# Patient Record
Sex: Female | Born: 1955
Health system: Southern US, Community
[De-identification: ages and names within clinical notes are randomized; demographics above are authoritative.]

## PROBLEM LIST (undated history)

## (undated) ENCOUNTER — Emergency Department (HOSPITAL_COMMUNITY): Admission: EM | Payer: Medicare Other

## (undated) DIAGNOSIS — I2510 Atherosclerotic heart disease of native coronary artery without angina pectoris: Secondary | ICD-10-CM

## (undated) DIAGNOSIS — J189 Pneumonia, unspecified organism: Secondary | ICD-10-CM

## (undated) DIAGNOSIS — T8149XA Infection following a procedure, other surgical site, initial encounter: Secondary | ICD-10-CM

## (undated) DIAGNOSIS — I5031 Acute diastolic (congestive) heart failure: Secondary | ICD-10-CM

## (undated) DIAGNOSIS — M109 Gout, unspecified: Secondary | ICD-10-CM

## (undated) DIAGNOSIS — N189 Chronic kidney disease, unspecified: Secondary | ICD-10-CM

## (undated) DIAGNOSIS — K219 Gastro-esophageal reflux disease without esophagitis: Secondary | ICD-10-CM

## (undated) DIAGNOSIS — B1920 Unspecified viral hepatitis C without hepatic coma: Secondary | ICD-10-CM

## (undated) DIAGNOSIS — E669 Obesity, unspecified: Secondary | ICD-10-CM

## (undated) DIAGNOSIS — T8859XA Other complications of anesthesia, initial encounter: Secondary | ICD-10-CM

## (undated) DIAGNOSIS — R011 Cardiac murmur, unspecified: Secondary | ICD-10-CM

## (undated) DIAGNOSIS — I10 Essential (primary) hypertension: Secondary | ICD-10-CM

## (undated) DIAGNOSIS — IMO0002 Reserved for concepts with insufficient information to code with codable children: Secondary | ICD-10-CM

## (undated) DIAGNOSIS — N186 End stage renal disease: Secondary | ICD-10-CM

## (undated) DIAGNOSIS — F329 Major depressive disorder, single episode, unspecified: Secondary | ICD-10-CM

## (undated) DIAGNOSIS — M779 Enthesopathy, unspecified: Secondary | ICD-10-CM

## (undated) DIAGNOSIS — R251 Tremor, unspecified: Secondary | ICD-10-CM

## (undated) DIAGNOSIS — M1990 Unspecified osteoarthritis, unspecified site: Secondary | ICD-10-CM

## (undated) DIAGNOSIS — I350 Nonrheumatic aortic (valve) stenosis: Secondary | ICD-10-CM

## (undated) DIAGNOSIS — IMO0001 Reserved for inherently not codable concepts without codable children: Secondary | ICD-10-CM

## (undated) DIAGNOSIS — Z531 Procedure and treatment not carried out because of patient's decision for reasons of belief and group pressure: Secondary | ICD-10-CM

## (undated) DIAGNOSIS — I5042 Chronic combined systolic (congestive) and diastolic (congestive) heart failure: Secondary | ICD-10-CM

## (undated) DIAGNOSIS — T4145XA Adverse effect of unspecified anesthetic, initial encounter: Secondary | ICD-10-CM

## (undated) DIAGNOSIS — K859 Acute pancreatitis without necrosis or infection, unspecified: Secondary | ICD-10-CM

## (undated) DIAGNOSIS — E785 Hyperlipidemia, unspecified: Secondary | ICD-10-CM

## (undated) DIAGNOSIS — J45909 Unspecified asthma, uncomplicated: Secondary | ICD-10-CM

## (undated) DIAGNOSIS — F32A Depression, unspecified: Secondary | ICD-10-CM

## (undated) DIAGNOSIS — D649 Anemia, unspecified: Secondary | ICD-10-CM

## (undated) DIAGNOSIS — J40 Bronchitis, not specified as acute or chronic: Secondary | ICD-10-CM

## (undated) DIAGNOSIS — I219 Acute myocardial infarction, unspecified: Secondary | ICD-10-CM

## (undated) DIAGNOSIS — Z992 Dependence on renal dialysis: Secondary | ICD-10-CM

## (undated) DIAGNOSIS — G4733 Obstructive sleep apnea (adult) (pediatric): Secondary | ICD-10-CM

## (undated) HISTORY — DX: Obstructive sleep apnea (adult) (pediatric): G47.33

## (undated) HISTORY — DX: Chronic kidney disease, unspecified: N18.9

## (undated) HISTORY — DX: Chronic combined systolic (congestive) and diastolic (congestive) heart failure: I50.42

## (undated) HISTORY — DX: Nonrheumatic aortic (valve) stenosis: I35.0

## (undated) HISTORY — PX: CHOLECYSTECTOMY: SHX55

## (undated) HISTORY — PX: TUBAL LIGATION: SHX77

## (undated) HISTORY — PX: KNEE ARTHROSCOPY: SUR90

---

## 1998-01-11 ENCOUNTER — Other Ambulatory Visit: Admission: RE | Admit: 1998-01-11 | Discharge: 1998-01-11 | Payer: Self-pay | Admitting: Hematology and Oncology

## 1998-11-28 ENCOUNTER — Ambulatory Visit (HOSPITAL_COMMUNITY): Admission: RE | Admit: 1998-11-28 | Discharge: 1998-11-28 | Payer: Self-pay | Admitting: Internal Medicine

## 1999-06-22 ENCOUNTER — Emergency Department (HOSPITAL_COMMUNITY): Admission: EM | Admit: 1999-06-22 | Discharge: 1999-06-22 | Payer: Self-pay | Admitting: Emergency Medicine

## 1999-08-15 ENCOUNTER — Ambulatory Visit (HOSPITAL_COMMUNITY): Admission: RE | Admit: 1999-08-15 | Discharge: 1999-08-15 | Payer: Self-pay | Admitting: *Deleted

## 2000-04-15 ENCOUNTER — Encounter: Admission: RE | Admit: 2000-04-15 | Discharge: 2000-04-15 | Payer: Self-pay | Admitting: Obstetrics & Gynecology

## 2000-04-15 ENCOUNTER — Other Ambulatory Visit: Admission: RE | Admit: 2000-04-15 | Discharge: 2000-04-15 | Payer: Self-pay | Admitting: Obstetrics

## 2000-04-26 ENCOUNTER — Ambulatory Visit (HOSPITAL_COMMUNITY): Admission: RE | Admit: 2000-04-26 | Discharge: 2000-04-26 | Payer: Self-pay

## 2000-05-22 ENCOUNTER — Emergency Department (HOSPITAL_COMMUNITY): Admission: EM | Admit: 2000-05-22 | Discharge: 2000-05-22 | Payer: Self-pay | Admitting: Emergency Medicine

## 2000-05-22 ENCOUNTER — Encounter: Payer: Self-pay | Admitting: Emergency Medicine

## 2000-06-09 ENCOUNTER — Encounter: Admission: RE | Admit: 2000-06-09 | Discharge: 2000-06-09 | Payer: Self-pay | Admitting: Family Medicine

## 2000-10-19 ENCOUNTER — Encounter: Admission: RE | Admit: 2000-10-19 | Discharge: 2000-10-19 | Payer: Self-pay | Admitting: Family Medicine

## 2000-10-21 ENCOUNTER — Encounter: Admission: RE | Admit: 2000-10-21 | Discharge: 2000-10-21 | Payer: Self-pay | Admitting: Family Medicine

## 2001-03-18 ENCOUNTER — Encounter: Admission: RE | Admit: 2001-03-18 | Discharge: 2001-03-18 | Payer: Self-pay | Admitting: Family Medicine

## 2001-04-21 ENCOUNTER — Encounter: Admission: RE | Admit: 2001-04-21 | Discharge: 2001-04-21 | Payer: Self-pay | Admitting: Family Medicine

## 2001-05-18 ENCOUNTER — Encounter: Payer: Self-pay | Admitting: Emergency Medicine

## 2001-05-18 ENCOUNTER — Observation Stay (HOSPITAL_COMMUNITY): Admission: EM | Admit: 2001-05-18 | Discharge: 2001-05-19 | Payer: Self-pay | Admitting: Emergency Medicine

## 2001-07-08 ENCOUNTER — Encounter: Admission: RE | Admit: 2001-07-08 | Discharge: 2001-07-08 | Payer: Self-pay | Admitting: Family Medicine

## 2001-10-03 ENCOUNTER — Encounter: Admission: RE | Admit: 2001-10-03 | Discharge: 2001-10-03 | Payer: Self-pay | Admitting: Sports Medicine

## 2001-10-19 ENCOUNTER — Encounter: Admission: RE | Admit: 2001-10-19 | Discharge: 2001-10-19 | Payer: Self-pay | Admitting: Family Medicine

## 2002-01-31 ENCOUNTER — Encounter: Admission: RE | Admit: 2002-01-31 | Discharge: 2002-01-31 | Payer: Self-pay | Admitting: Family Medicine

## 2002-01-31 ENCOUNTER — Encounter: Admission: RE | Admit: 2002-01-31 | Discharge: 2002-01-31 | Payer: Self-pay | Admitting: *Deleted

## 2002-02-03 ENCOUNTER — Encounter: Admission: RE | Admit: 2002-02-03 | Discharge: 2002-02-03 | Payer: Self-pay | Admitting: Family Medicine

## 2002-04-07 ENCOUNTER — Encounter: Payer: Self-pay | Admitting: Internal Medicine

## 2002-04-07 ENCOUNTER — Inpatient Hospital Stay (HOSPITAL_COMMUNITY): Admission: AD | Admit: 2002-04-07 | Discharge: 2002-04-12 | Payer: Self-pay | Admitting: Internal Medicine

## 2002-04-08 ENCOUNTER — Encounter: Payer: Self-pay | Admitting: Internal Medicine

## 2002-04-10 ENCOUNTER — Encounter: Payer: Self-pay | Admitting: Internal Medicine

## 2002-05-04 ENCOUNTER — Encounter: Payer: Self-pay | Admitting: Internal Medicine

## 2002-05-04 ENCOUNTER — Encounter: Admission: RE | Admit: 2002-05-04 | Discharge: 2002-05-04 | Payer: Self-pay | Admitting: Internal Medicine

## 2002-07-21 ENCOUNTER — Encounter: Admission: RE | Admit: 2002-07-21 | Discharge: 2002-07-21 | Payer: Self-pay | Admitting: Internal Medicine

## 2002-07-21 ENCOUNTER — Encounter: Payer: Self-pay | Admitting: Internal Medicine

## 2002-08-13 ENCOUNTER — Emergency Department (HOSPITAL_COMMUNITY): Admission: EM | Admit: 2002-08-13 | Discharge: 2002-08-13 | Payer: Self-pay | Admitting: Emergency Medicine

## 2002-11-02 ENCOUNTER — Emergency Department (HOSPITAL_COMMUNITY): Admission: EM | Admit: 2002-11-02 | Discharge: 2002-11-02 | Payer: Self-pay | Admitting: Emergency Medicine

## 2003-04-04 ENCOUNTER — Emergency Department (HOSPITAL_COMMUNITY): Admission: EM | Admit: 2003-04-04 | Discharge: 2003-04-04 | Payer: Self-pay

## 2003-06-11 ENCOUNTER — Emergency Department (HOSPITAL_COMMUNITY): Admission: EM | Admit: 2003-06-11 | Discharge: 2003-06-11 | Payer: Self-pay | Admitting: Emergency Medicine

## 2003-06-28 ENCOUNTER — Emergency Department (HOSPITAL_COMMUNITY): Admission: AD | Admit: 2003-06-28 | Discharge: 2003-06-28 | Payer: Self-pay | Admitting: Family Medicine

## 2003-07-02 ENCOUNTER — Emergency Department (HOSPITAL_COMMUNITY): Admission: EM | Admit: 2003-07-02 | Discharge: 2003-07-02 | Payer: Self-pay | Admitting: Emergency Medicine

## 2003-07-20 ENCOUNTER — Encounter: Admission: RE | Admit: 2003-07-20 | Discharge: 2003-07-20 | Payer: Self-pay | Admitting: Internal Medicine

## 2003-07-25 ENCOUNTER — Emergency Department (HOSPITAL_COMMUNITY): Admission: EM | Admit: 2003-07-25 | Discharge: 2003-07-26 | Payer: Self-pay | Admitting: Emergency Medicine

## 2003-11-16 ENCOUNTER — Other Ambulatory Visit: Admission: RE | Admit: 2003-11-16 | Discharge: 2003-11-16 | Payer: Self-pay | Admitting: Obstetrics and Gynecology

## 2003-11-19 ENCOUNTER — Ambulatory Visit (HOSPITAL_COMMUNITY): Admission: RE | Admit: 2003-11-19 | Discharge: 2003-11-19 | Payer: Self-pay | Admitting: Obstetrics and Gynecology

## 2003-11-20 ENCOUNTER — Emergency Department (HOSPITAL_COMMUNITY): Admission: EM | Admit: 2003-11-20 | Discharge: 2003-11-20 | Payer: Self-pay

## 2003-11-23 ENCOUNTER — Emergency Department (HOSPITAL_COMMUNITY): Admission: EM | Admit: 2003-11-23 | Discharge: 2003-11-24 | Payer: Self-pay | Admitting: Emergency Medicine

## 2004-02-17 ENCOUNTER — Emergency Department (HOSPITAL_COMMUNITY): Admission: EM | Admit: 2004-02-17 | Discharge: 2004-02-17 | Payer: Self-pay | Admitting: Emergency Medicine

## 2004-04-03 ENCOUNTER — Emergency Department (HOSPITAL_COMMUNITY): Admission: EM | Admit: 2004-04-03 | Discharge: 2004-04-03 | Payer: Self-pay | Admitting: Emergency Medicine

## 2004-04-29 ENCOUNTER — Ambulatory Visit (HOSPITAL_COMMUNITY): Admission: RE | Admit: 2004-04-29 | Discharge: 2004-04-29 | Payer: Self-pay | Admitting: Obstetrics and Gynecology

## 2004-04-29 ENCOUNTER — Encounter (INDEPENDENT_AMBULATORY_CARE_PROVIDER_SITE_OTHER): Payer: Self-pay | Admitting: Specialist

## 2004-06-08 ENCOUNTER — Emergency Department (HOSPITAL_COMMUNITY): Admission: EM | Admit: 2004-06-08 | Discharge: 2004-06-08 | Payer: Self-pay | Admitting: Emergency Medicine

## 2004-06-29 ENCOUNTER — Emergency Department (HOSPITAL_COMMUNITY): Admission: EM | Admit: 2004-06-29 | Discharge: 2004-06-29 | Payer: Self-pay | Admitting: Emergency Medicine

## 2004-08-04 ENCOUNTER — Ambulatory Visit (HOSPITAL_COMMUNITY): Admission: RE | Admit: 2004-08-04 | Discharge: 2004-08-04 | Payer: Self-pay | Admitting: Internal Medicine

## 2004-09-01 ENCOUNTER — Emergency Department (HOSPITAL_COMMUNITY): Admission: EM | Admit: 2004-09-01 | Discharge: 2004-09-01 | Payer: Self-pay | Admitting: *Deleted

## 2004-09-08 ENCOUNTER — Emergency Department (HOSPITAL_COMMUNITY): Admission: EM | Admit: 2004-09-08 | Discharge: 2004-09-08 | Payer: Self-pay | Admitting: Emergency Medicine

## 2004-11-18 ENCOUNTER — Other Ambulatory Visit: Admission: RE | Admit: 2004-11-18 | Discharge: 2004-11-18 | Payer: Self-pay | Admitting: Obstetrics and Gynecology

## 2004-12-17 ENCOUNTER — Encounter (INDEPENDENT_AMBULATORY_CARE_PROVIDER_SITE_OTHER): Payer: Self-pay | Admitting: *Deleted

## 2004-12-17 ENCOUNTER — Ambulatory Visit (HOSPITAL_COMMUNITY): Admission: RE | Admit: 2004-12-17 | Discharge: 2004-12-17 | Payer: Self-pay | Admitting: Obstetrics and Gynecology

## 2005-01-06 ENCOUNTER — Encounter (INDEPENDENT_AMBULATORY_CARE_PROVIDER_SITE_OTHER): Payer: Self-pay | Admitting: Cardiology

## 2005-01-06 ENCOUNTER — Ambulatory Visit (HOSPITAL_COMMUNITY): Admission: RE | Admit: 2005-01-06 | Discharge: 2005-01-06 | Payer: Self-pay | Admitting: Internal Medicine

## 2005-01-31 ENCOUNTER — Emergency Department (HOSPITAL_COMMUNITY): Admission: EM | Admit: 2005-01-31 | Discharge: 2005-02-01 | Payer: Self-pay | Admitting: Emergency Medicine

## 2005-02-04 ENCOUNTER — Emergency Department (HOSPITAL_COMMUNITY): Admission: EM | Admit: 2005-02-04 | Discharge: 2005-02-05 | Payer: Self-pay | Admitting: Emergency Medicine

## 2005-04-27 ENCOUNTER — Emergency Department (HOSPITAL_COMMUNITY): Admission: EM | Admit: 2005-04-27 | Discharge: 2005-04-27 | Payer: Self-pay | Admitting: Emergency Medicine

## 2005-06-03 ENCOUNTER — Emergency Department (HOSPITAL_COMMUNITY): Admission: EM | Admit: 2005-06-03 | Discharge: 2005-06-03 | Payer: Self-pay | Admitting: Emergency Medicine

## 2005-10-14 ENCOUNTER — Emergency Department (HOSPITAL_COMMUNITY): Admission: EM | Admit: 2005-10-14 | Discharge: 2005-10-14 | Payer: Self-pay | Admitting: Emergency Medicine
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
## 2005-11-02 ENCOUNTER — Emergency Department (HOSPITAL_COMMUNITY): Admission: EM | Admit: 2005-11-02 | Discharge: 2005-11-02 | Payer: Self-pay | Admitting: Emergency Medicine

## 2005-11-03 ENCOUNTER — Emergency Department (HOSPITAL_COMMUNITY): Admission: EM | Admit: 2005-11-03 | Discharge: 2005-11-03 | Payer: Self-pay | Admitting: Emergency Medicine

## 2006-03-08 IMAGING — US US PELVIS COMPLETE MODIFY
1 series · 18 of 25 positions shown · non-contrast
Comparison: none

CLINICAL DATA: Menorrhagia.  LMP of 11/08/03.
PELVIC ULTRASOUND WITH TRANSVAGINAL, 11/19/03  
Multiple images of the uterus and adnexa were obtained using transabdominal and endovaginal approaches.  
The uterus has a maximal sagittal length of 11.7 cm and a maximal AP width of 5.4 cm.  There is a small amount of complex fluid identified in the endometrial canal, which likely represents blood in this patient with a history of vaginal bleeding.  Emanating into the endometrial canal from the anterior myometrium is what appears to be a predominantly submucosal fibroid.  This measures of 2.4 x 1.7 x 2.0 cm and greater than 50 percent of the surface of this fibroid appears to be within the endometrial lumen.  The endometrial canal appears thin and echogenic and is seen overlying this fibroid with no areas of focal thickening or inhomogeneity noted.  The myometrium is otherwise homogeneous.
The left ovary has a normal appearance, measuring 2.7 x 1.6 x 1.8 cm.  The right ovary measures 3.9 x 2.3 x 3.2 cm and contains a unilocular simple cyst measuring 2.7 x 2.0 x 2.5 cm.  Given the phase of the patient?s cycle, this most likely represents a dominant follicle.  No free fluid or separate adnexal masses are noted. 
IMPRESSION 
Findings compatible with a submucosal fibroid with size and location as noted above.  The overlying endometrium appears within normal limits.  Probable dominant right follicle.

[Series 1: us transvaginal non-ob · 18 of 42 slices shown]
[im 1/42]
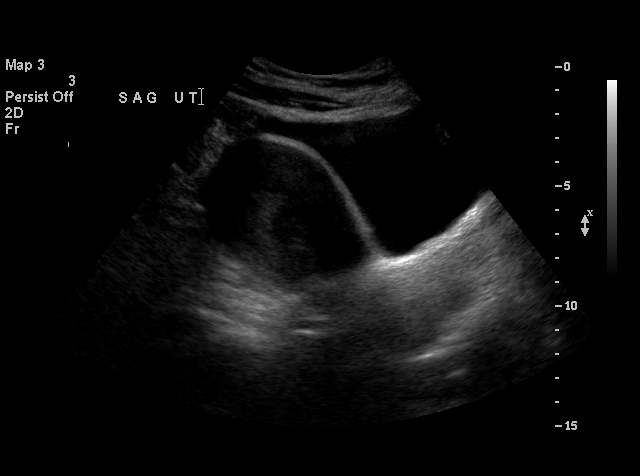
[im 4/42]
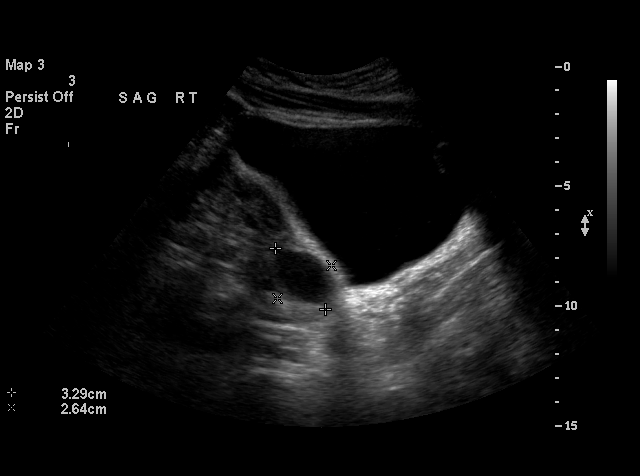
[im 6/42]
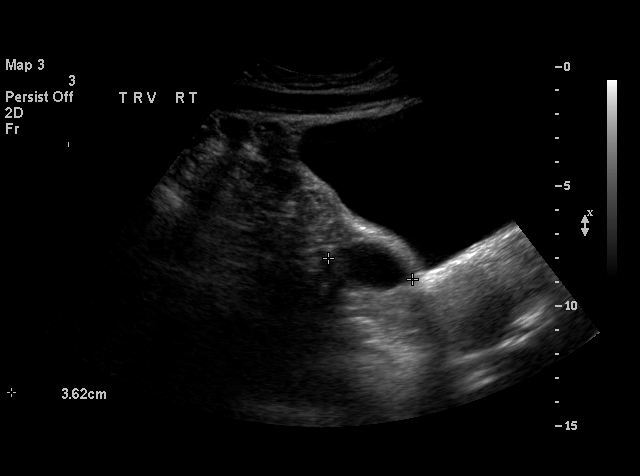
[im 7/42]
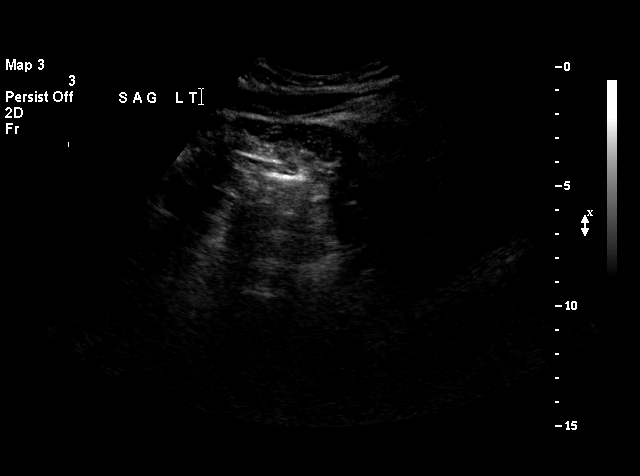
[im 11/42]
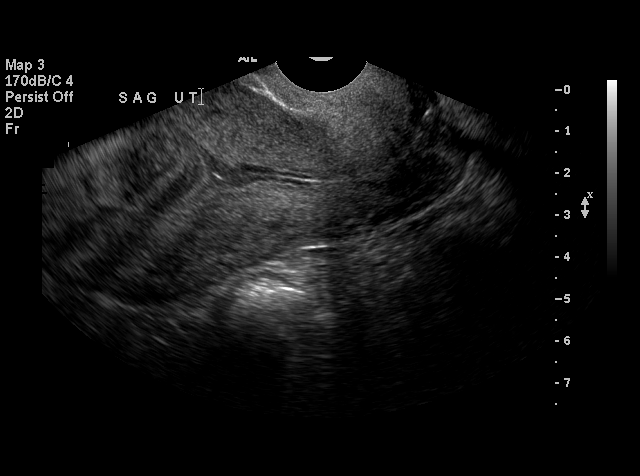
[im 12/42]
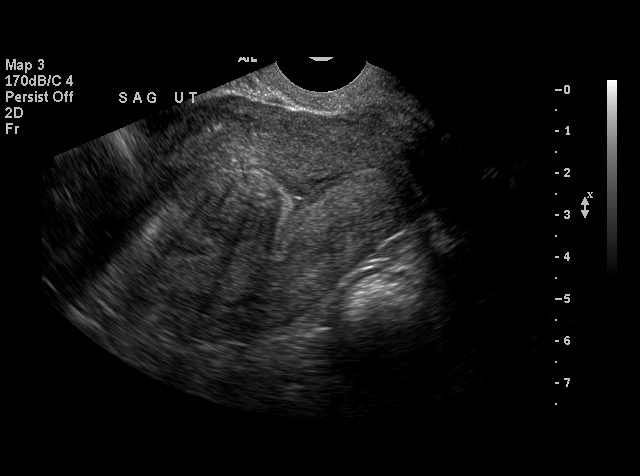
[im 16/42]
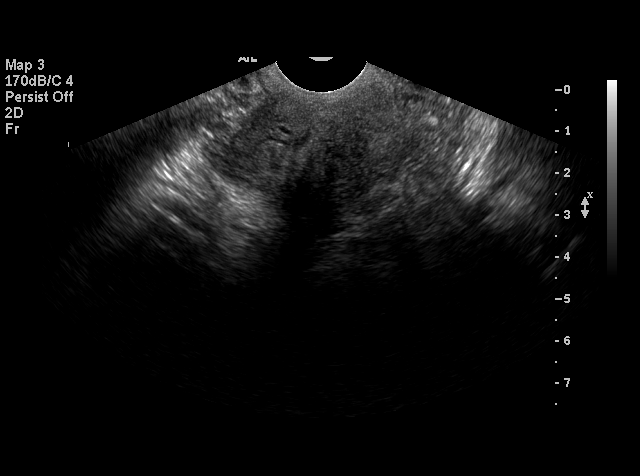
[im 18/42]
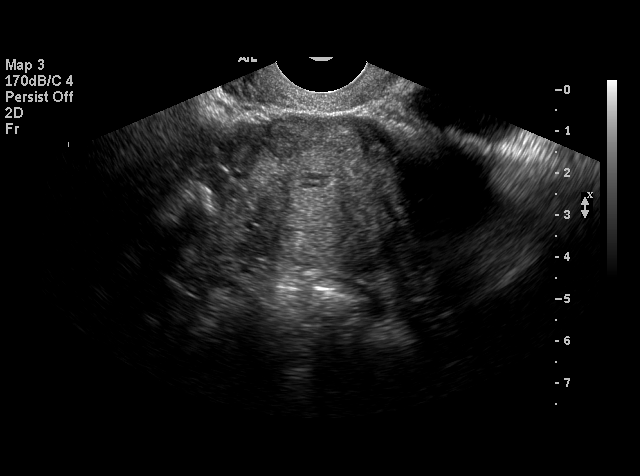
[im 19/42]
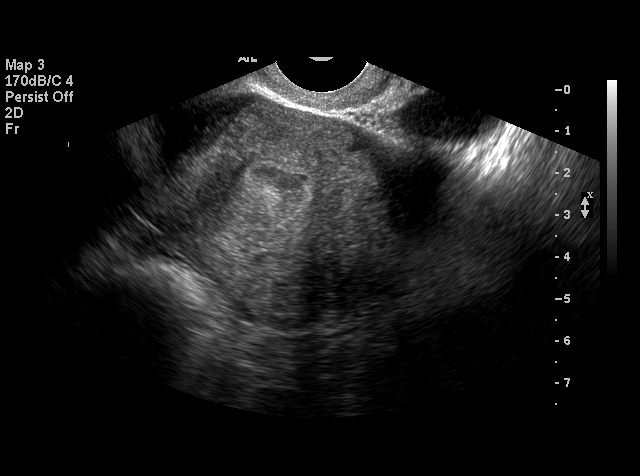
[im 23/42]
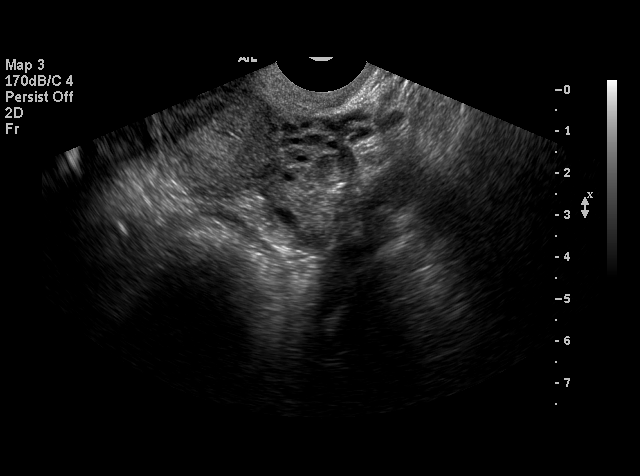
[im 24/42]
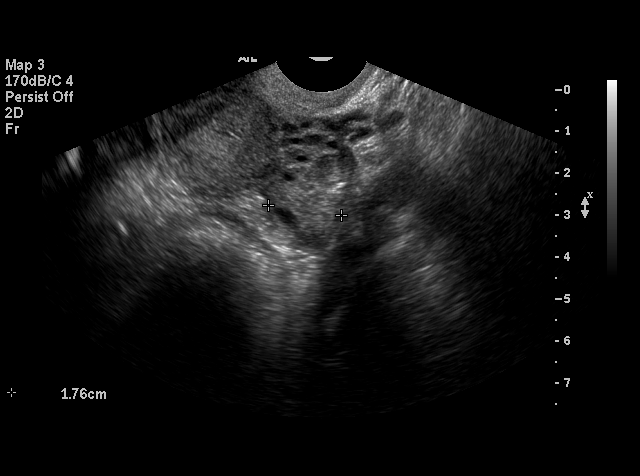
[im 26/42]
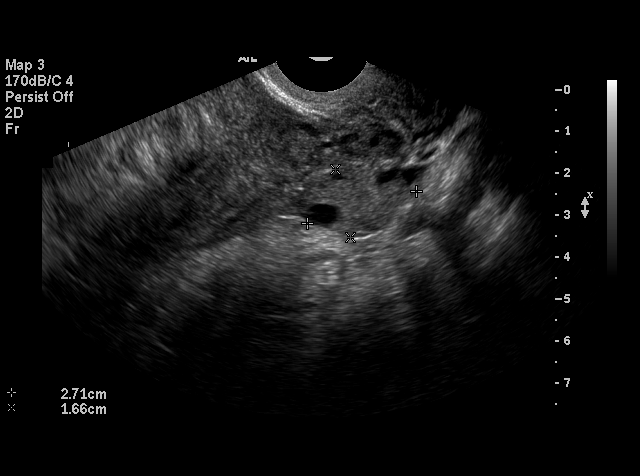
[im 30/42]
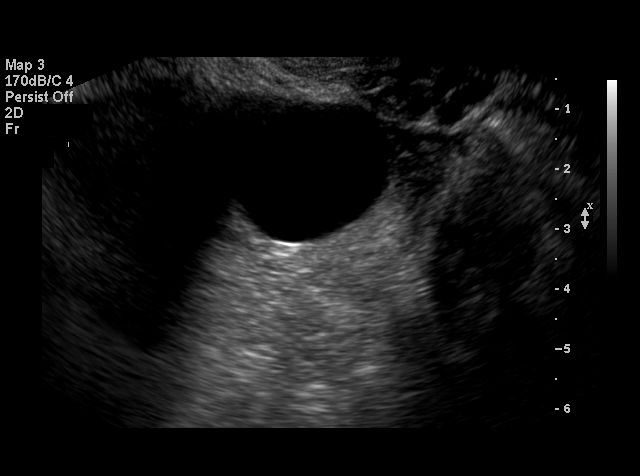
[im 31/42]
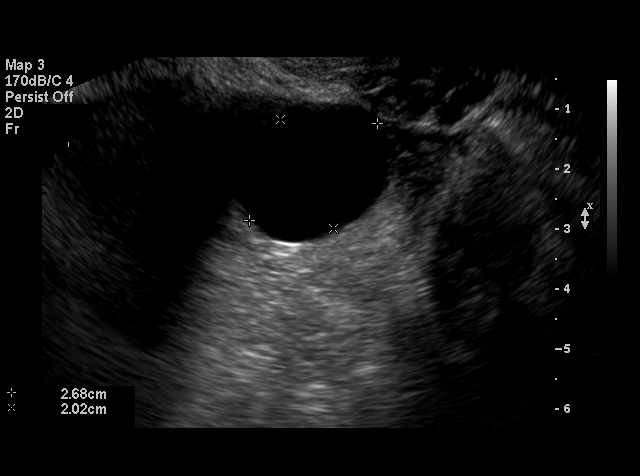
[im 35/42]
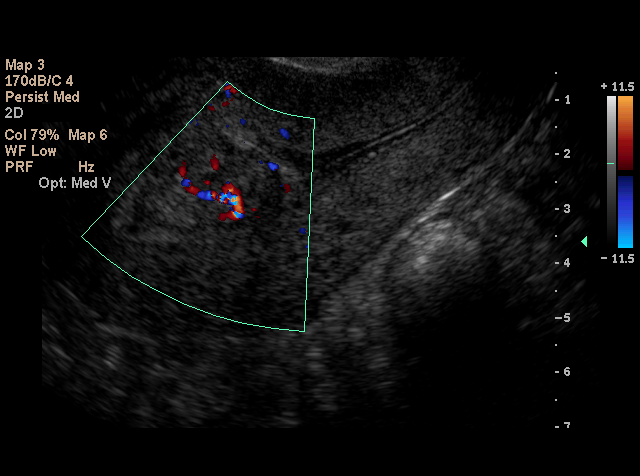
[im 36/42]
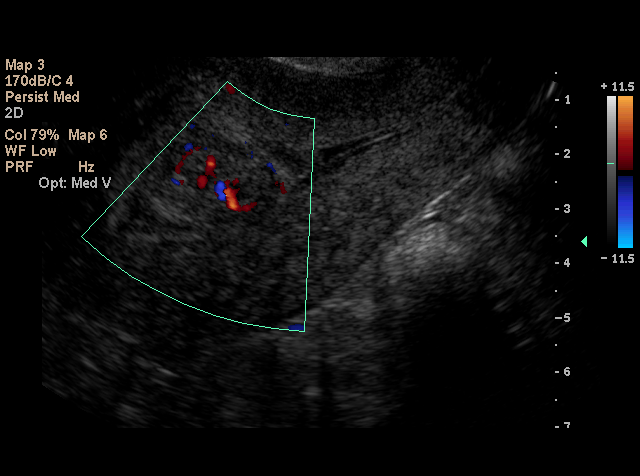
[im 38/42]
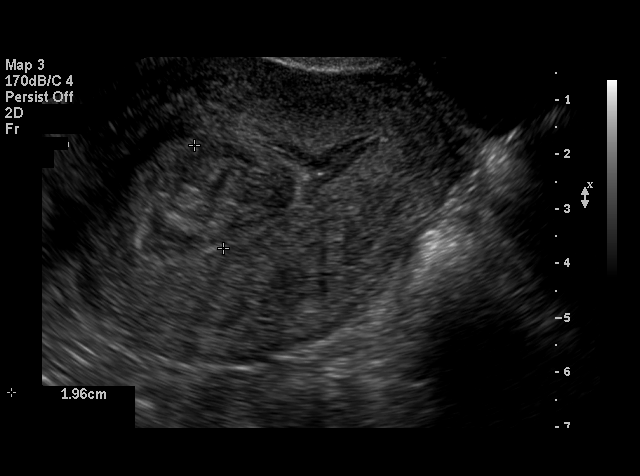
[im 42/42]
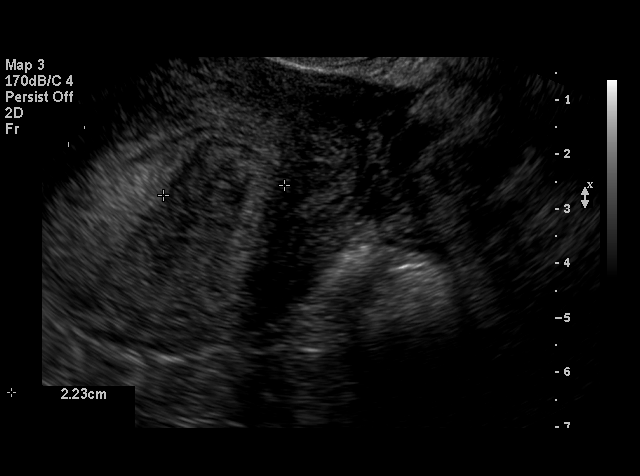

[18 of 25 positions shown; findings below may reference images not displayed]

## 2006-03-21 ENCOUNTER — Inpatient Hospital Stay (HOSPITAL_COMMUNITY): Admission: EM | Admit: 2006-03-21 | Discharge: 2006-03-26 | Payer: Self-pay | Admitting: Emergency Medicine

## 2006-03-23 ENCOUNTER — Encounter (INDEPENDENT_AMBULATORY_CARE_PROVIDER_SITE_OTHER): Payer: Self-pay | Admitting: *Deleted

## 2006-03-31 ENCOUNTER — Other Ambulatory Visit: Admission: RE | Admit: 2006-03-31 | Discharge: 2006-03-31 | Payer: Self-pay | Admitting: Obstetrics and Gynecology

## 2006-06-06 IMAGING — CR DG KNEE COMPLETE 4+V*L*
4 series · 4 of 4 positions shown · non-contrast
Comparison: none

CLINICAL DATA: 47-year-old with pain in lateral aspect of the knee.  
 LEFT KNEE COMPLETE (FOUR VIEWS)
 Four views are performed of the knee showing degenerative change especially involving the medial and patellofemoral compartments.  Note is made of joint effusion.  No evidence for acute fracture or dislocation. 
 IMPRESSION
 Degenerative change and joint effusion.

[view not recorded (1 of 4)]
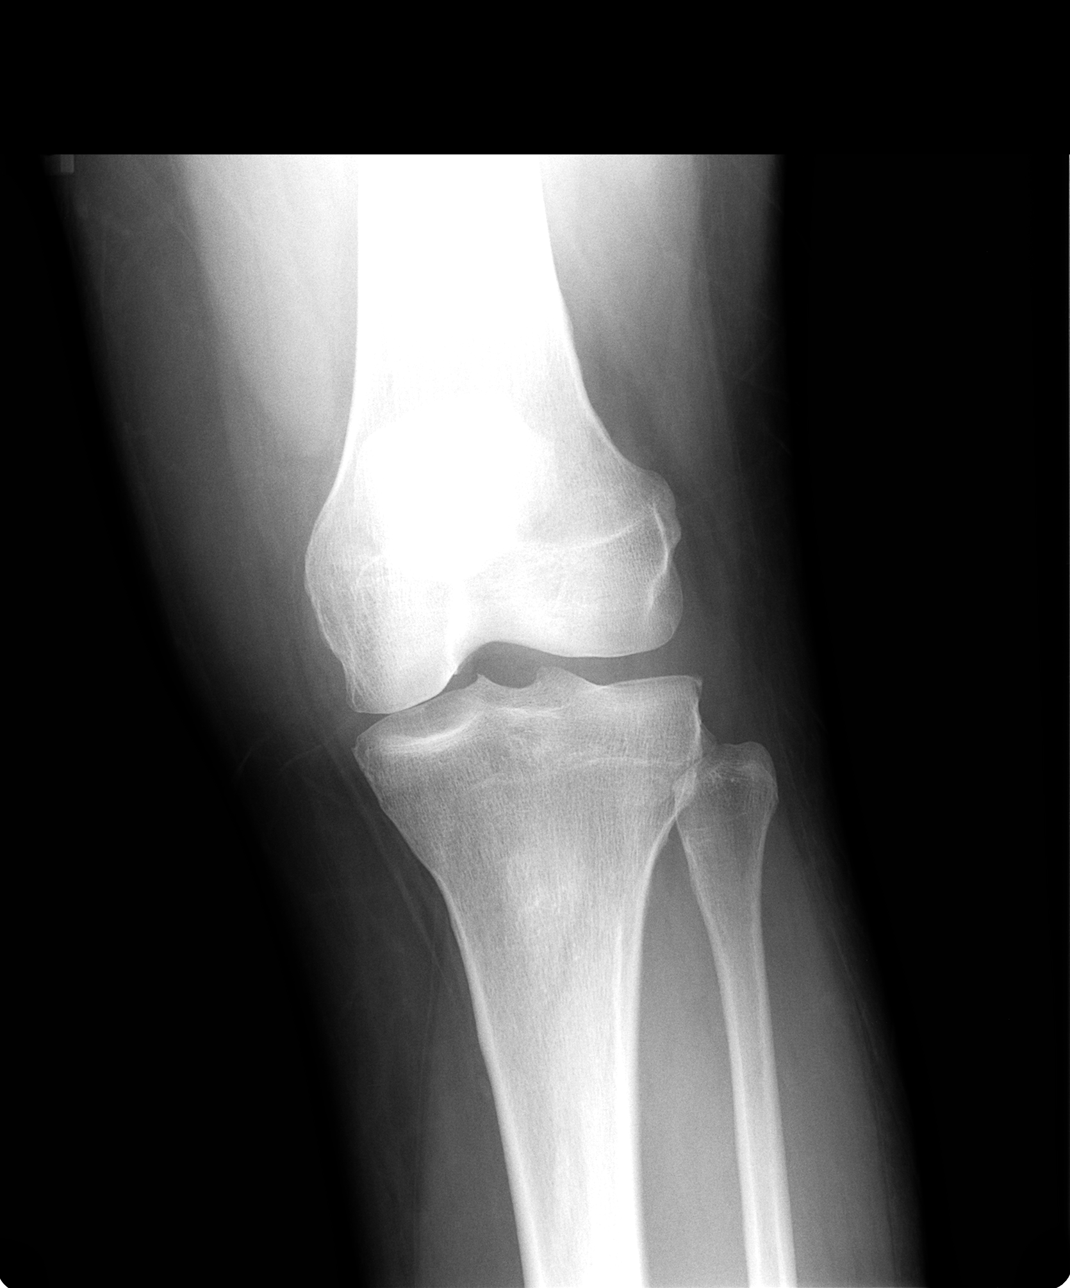

[view not recorded (2 of 4)]
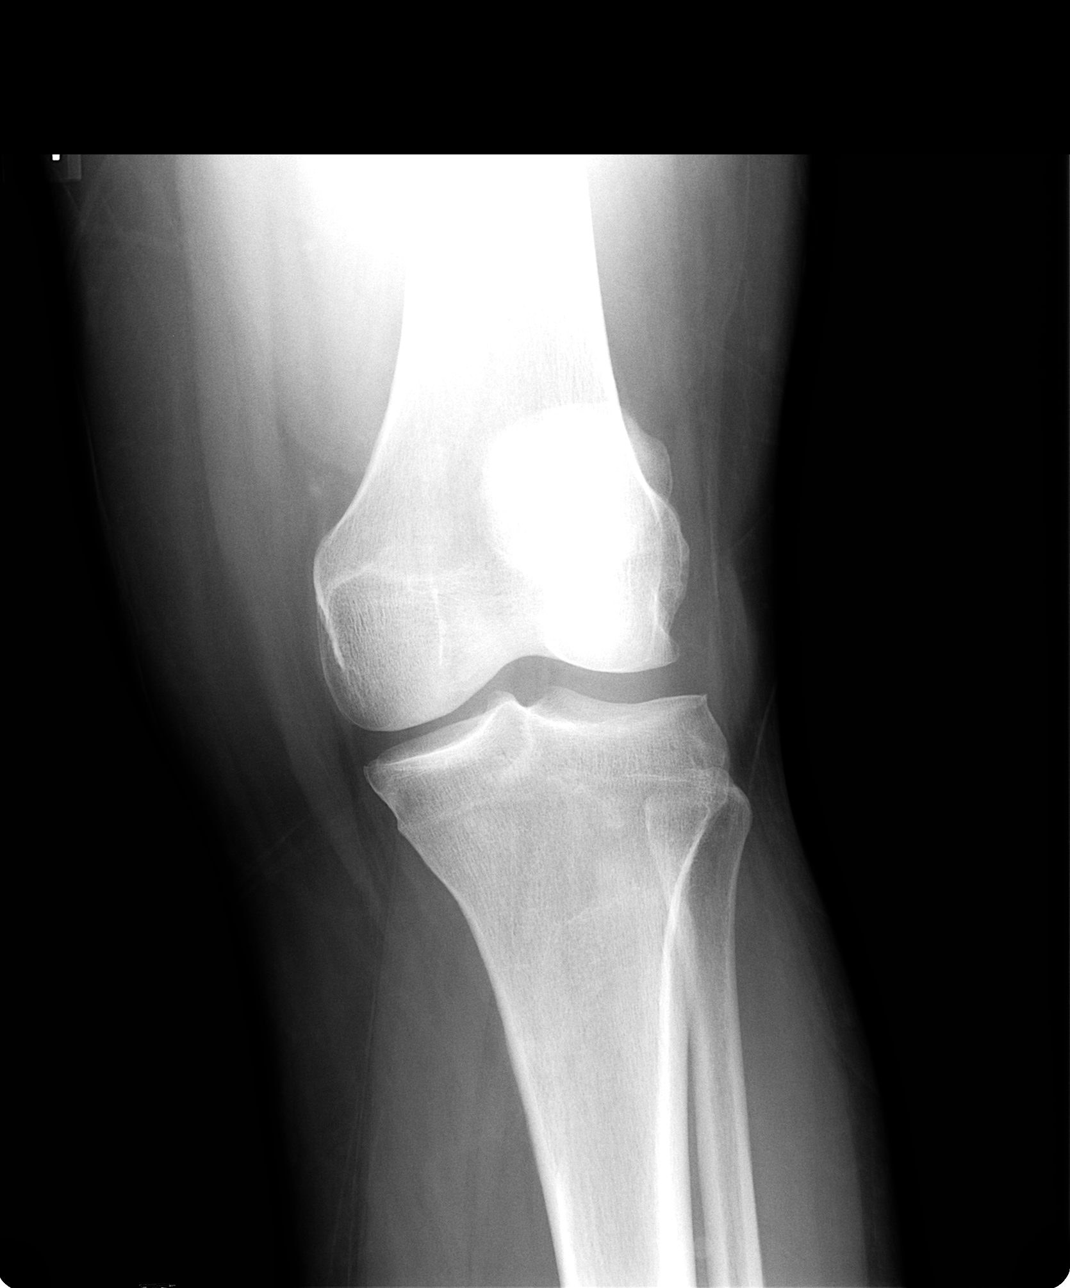

[view not recorded (3 of 4)]
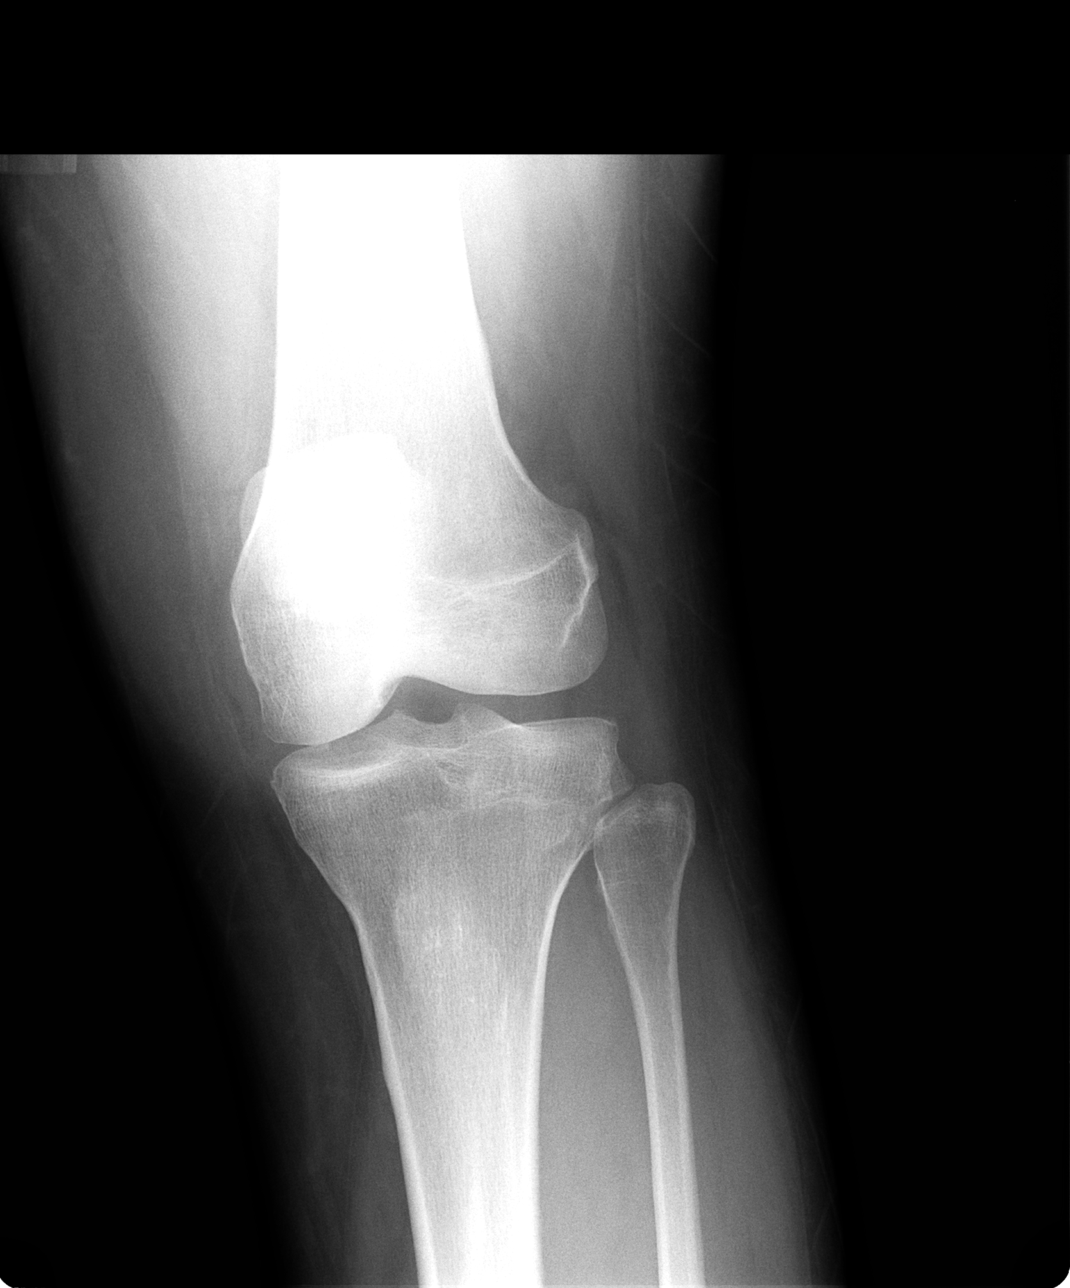

[view not recorded (4 of 4)]
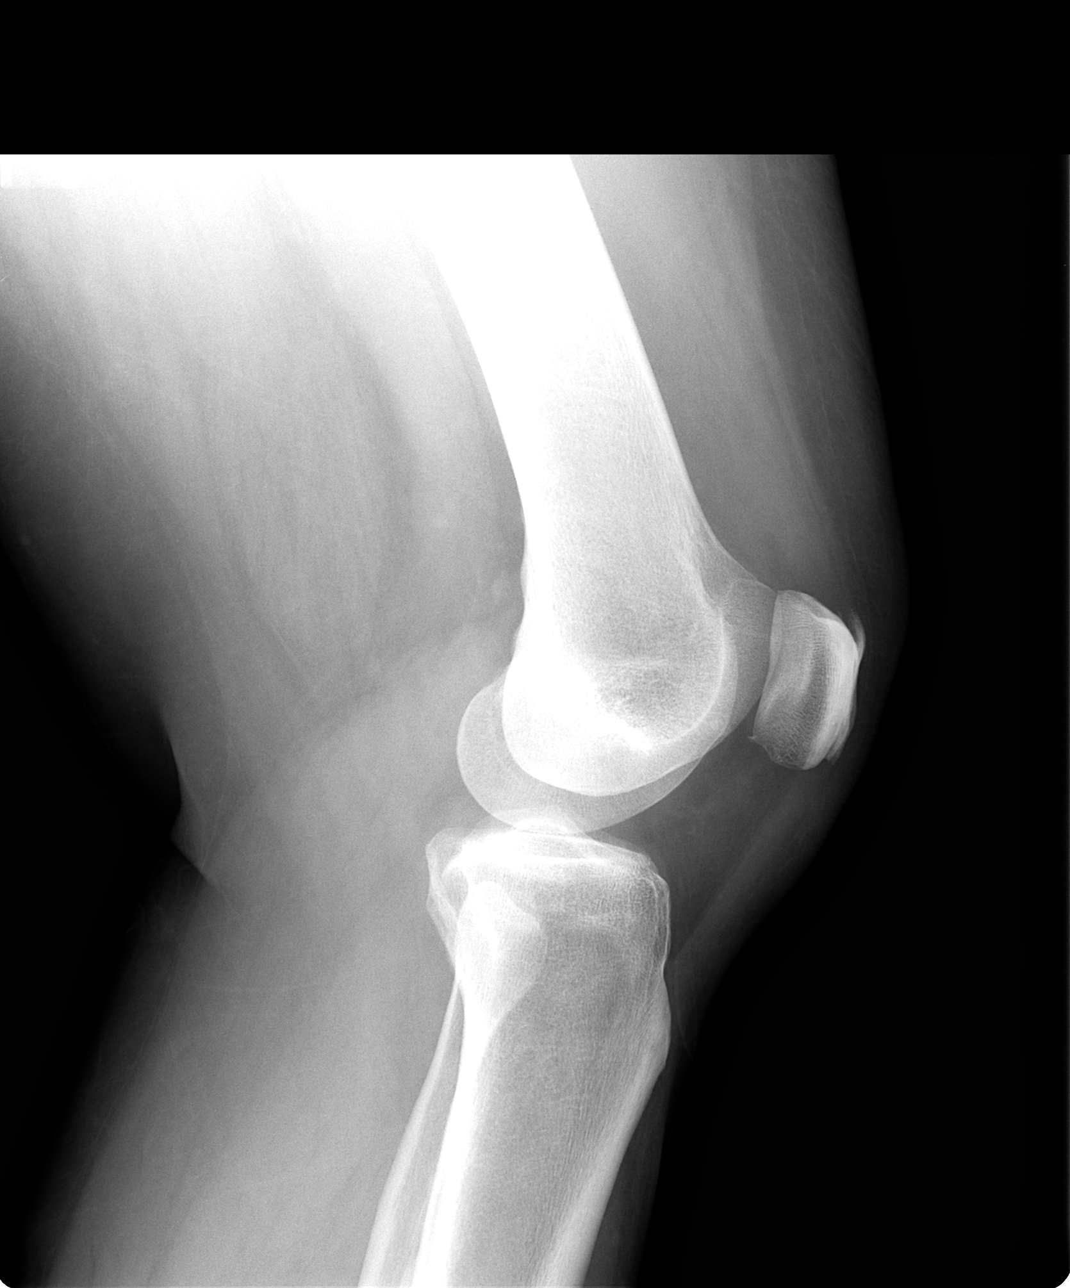

[4 of 4 positions shown; findings below may reference images not displayed]

## 2006-07-14 ENCOUNTER — Emergency Department (HOSPITAL_COMMUNITY): Admission: EM | Admit: 2006-07-14 | Discharge: 2006-07-14 | Payer: Self-pay | Admitting: Emergency Medicine

## 2006-07-22 IMAGING — CR DG ANKLE COMPLETE 3+V*R*
3 series · 3 of 3 positions shown · non-contrast
Comparison: none

CLINICAL DATA: Pain without trauma.

RIGHT FOOT, THREE VIEWS

[view not recorded (1 of 3)]
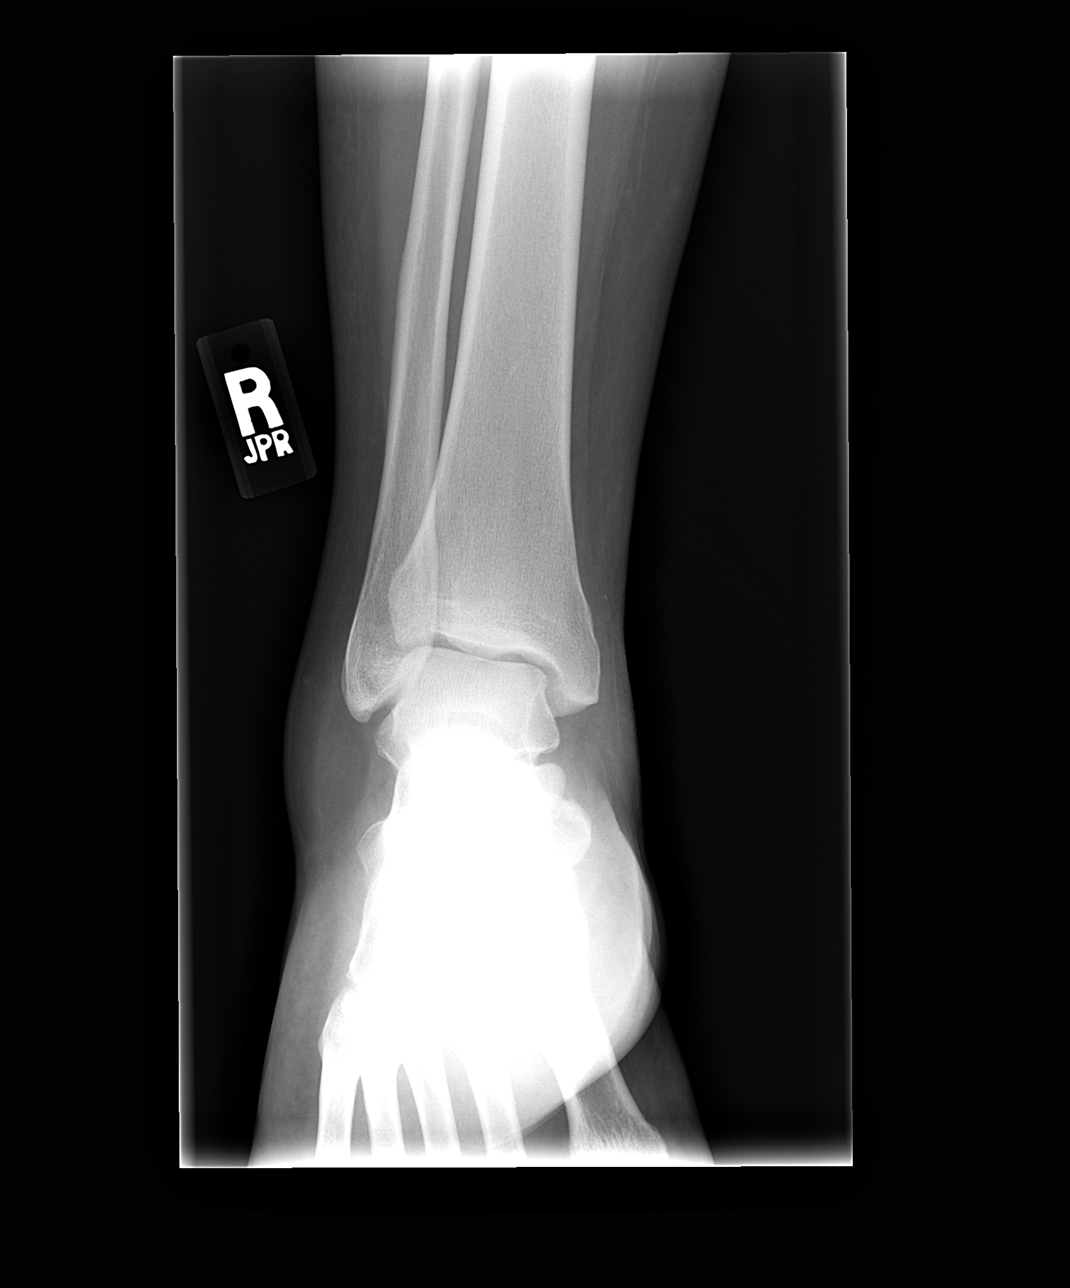

[view not recorded (2 of 3)]
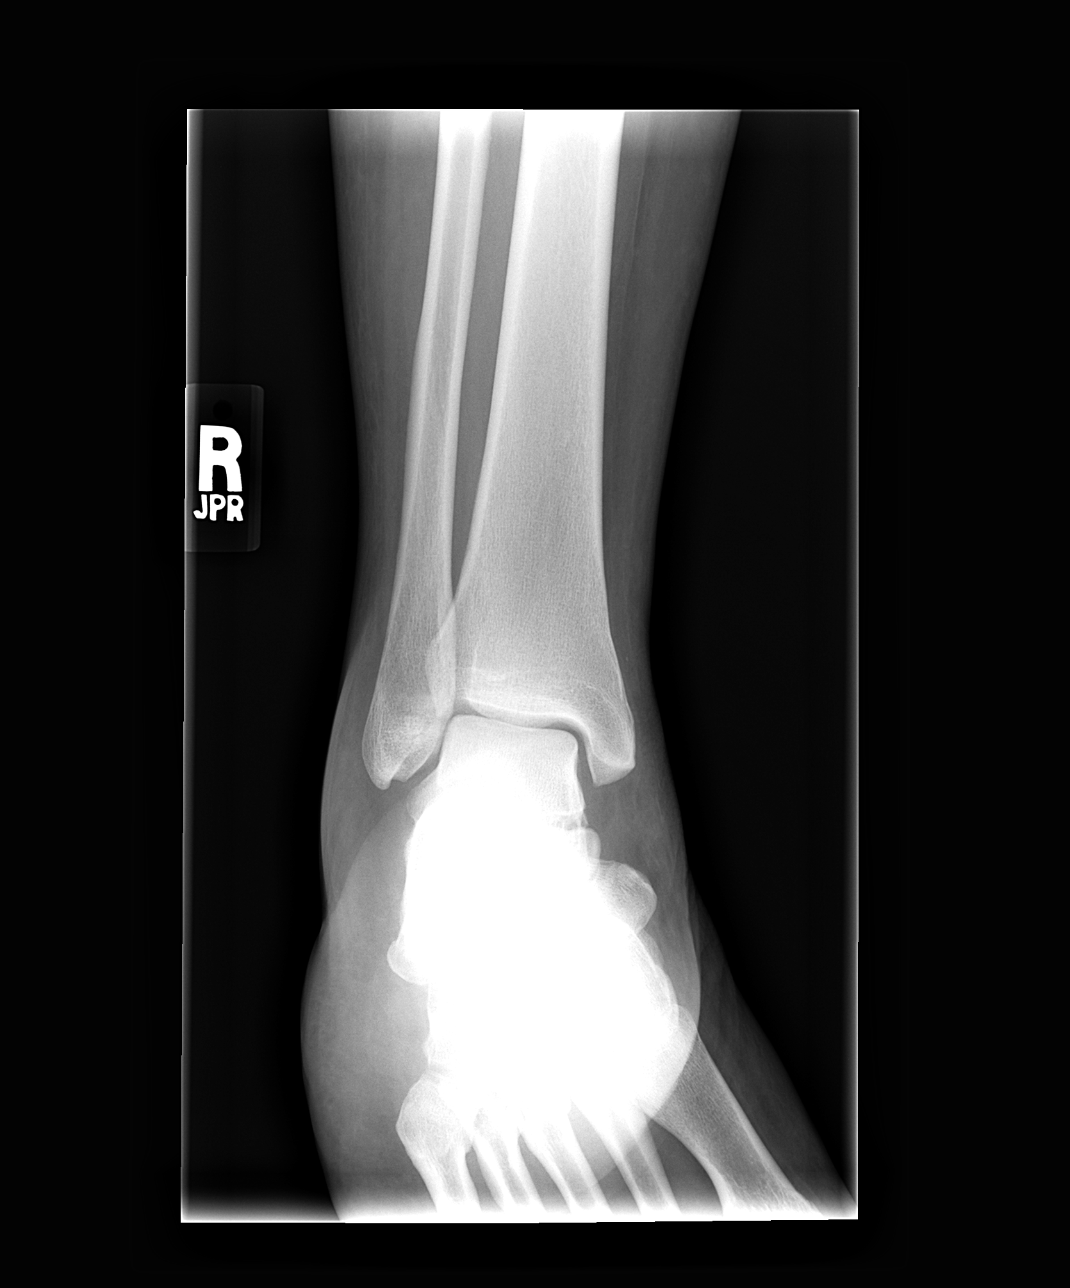

[view not recorded (3 of 3)]
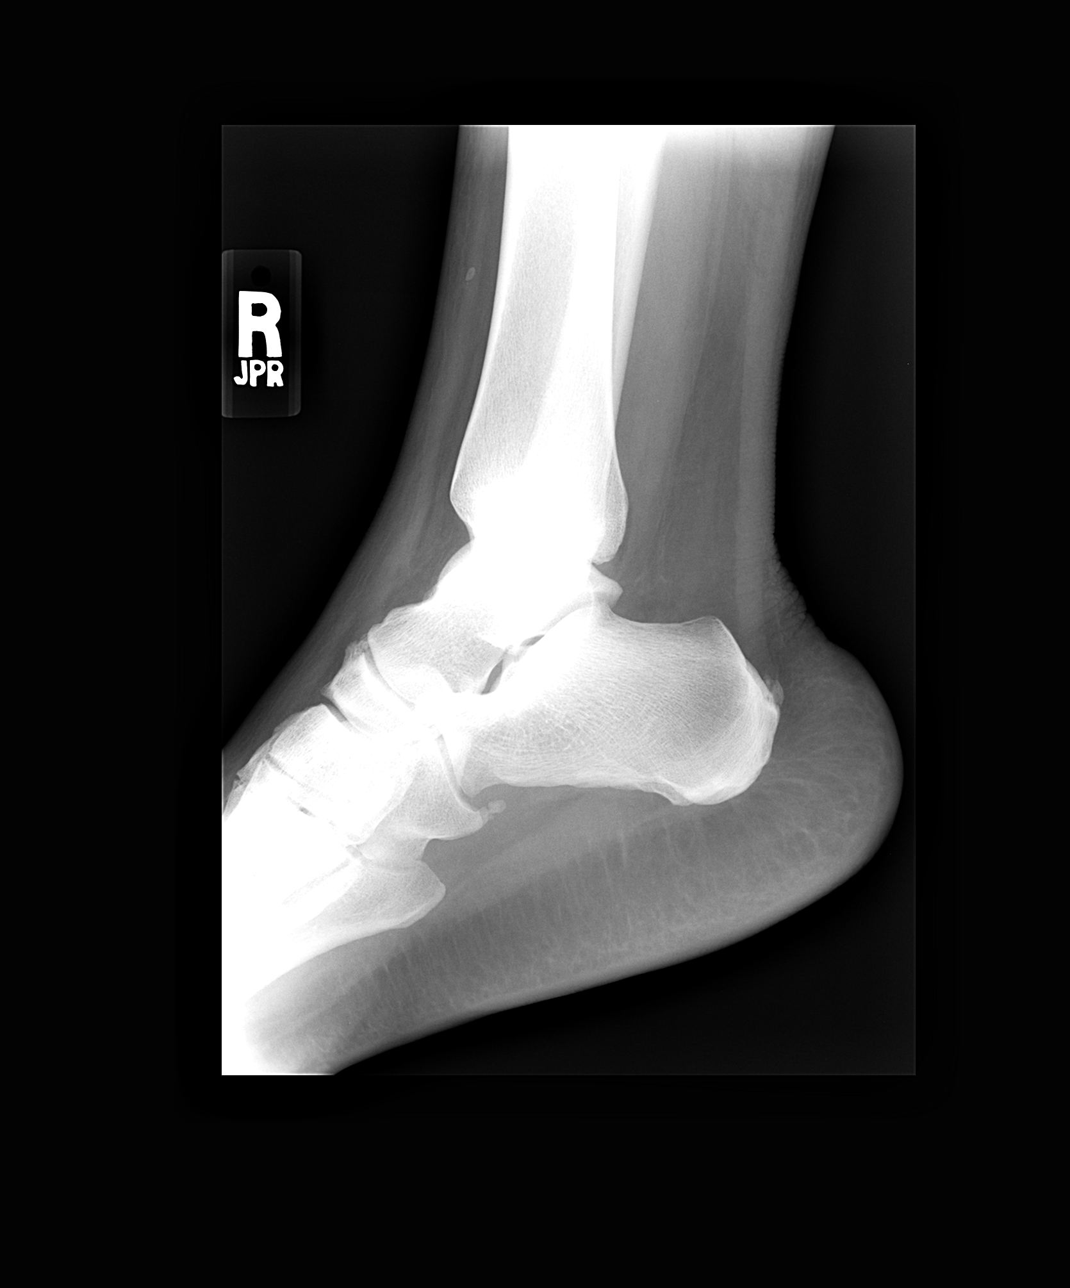

[3 of 3 positions shown; findings below may reference images not displayed]

FINDINGS: There is diffuse soft tissue swelling with reticulation in the plantar soft tissues.
Negative for fracture, dislocation, or other acute bone abnormality. Small calcaneal spur at the
Achilles tendon. No other degenerative changes noted.

IMPRESSION

1. Negative for fracture or other acute bone abnormality.

2. Diffuse soft tissue swelling.

RIGHT ANKLE, THREE VIEWS
FINDINGS: There is soft tissue swelling inferior to the malleoli, greater laterally than medially.
Negative for fracture, dislocation, or other acute bone abnormality. Ankle mortise is intact.
Calcaneal spur incidentally noted.

IMPRESSION

1. Negative for fracture, dislocation, or other acute abnormality.

2. Soft tissue swelling, lateral more than medial, nonspecific.

## 2006-09-20 ENCOUNTER — Emergency Department (HOSPITAL_COMMUNITY): Admission: EM | Admit: 2006-09-20 | Discharge: 2006-09-20 | Payer: Self-pay | Admitting: Emergency Medicine

## 2006-09-26 IMAGING — CT CT ABDOMEN W/O CM
1 of 2 series · 15 of 32 positions shown, 19 images · non-contrast
Comparison: none

HISTORY: Right flank pain

[Series 2: renal stone · axial · 0.86mm/px · z∈[-468,-58]mm · 15 of 90 slices shown, 19 images]
[im 4/90  soft-tissue]
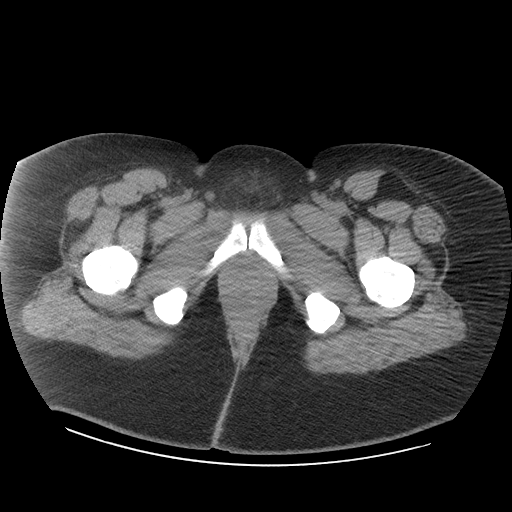
[im 4/90  bone]
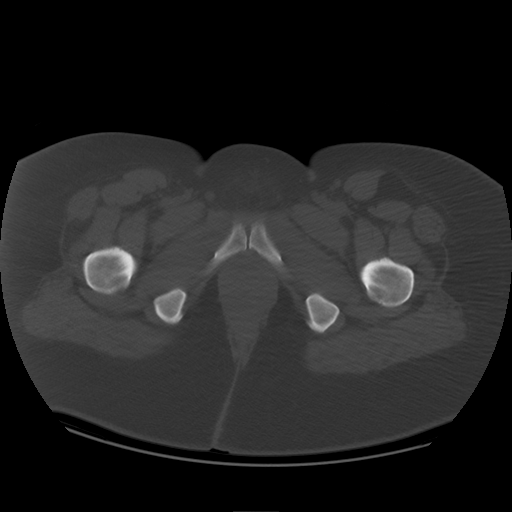
[im 12/90  soft-tissue]
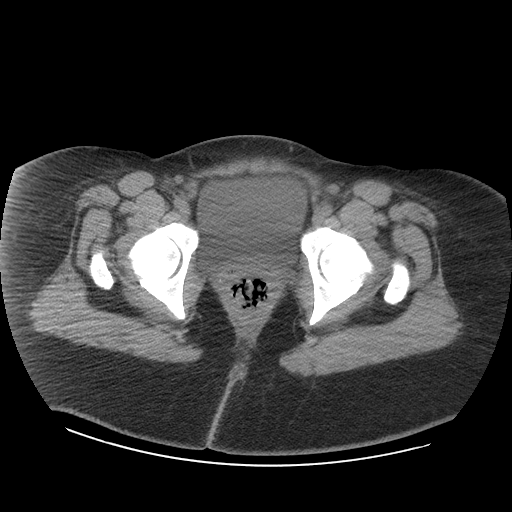
[im 20/90  soft-tissue]
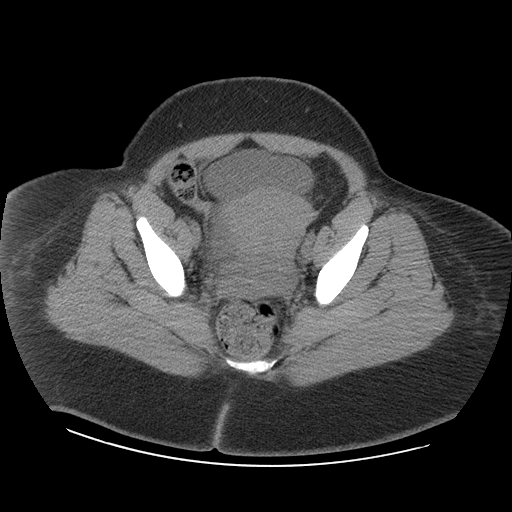
[im 24/90  soft-tissue]
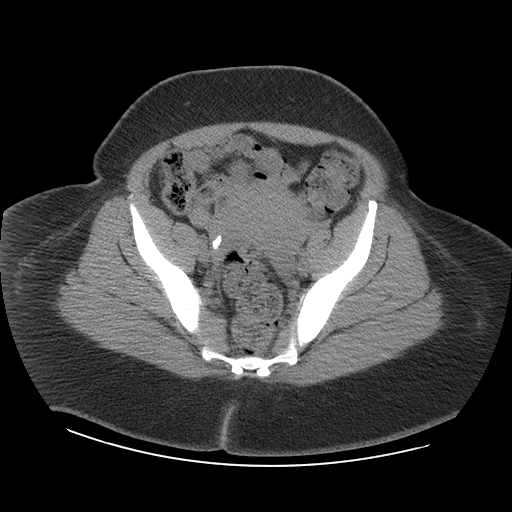
[im 31/90  soft-tissue]
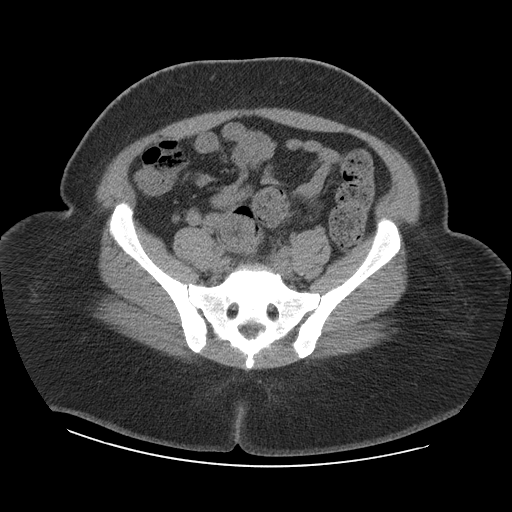
[im 39/90  soft-tissue]
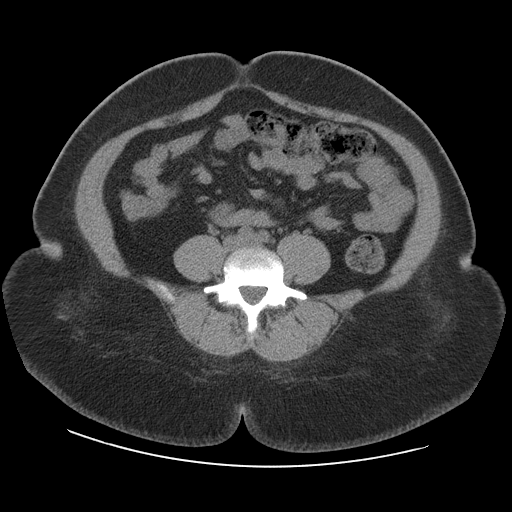
[im 47/90  soft-tissue]
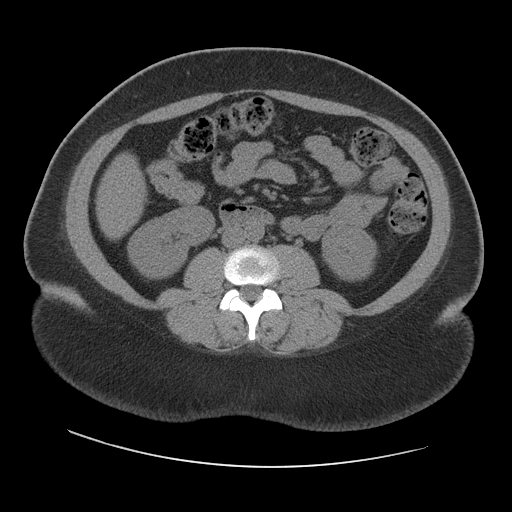
[im 51/90  soft-tissue]
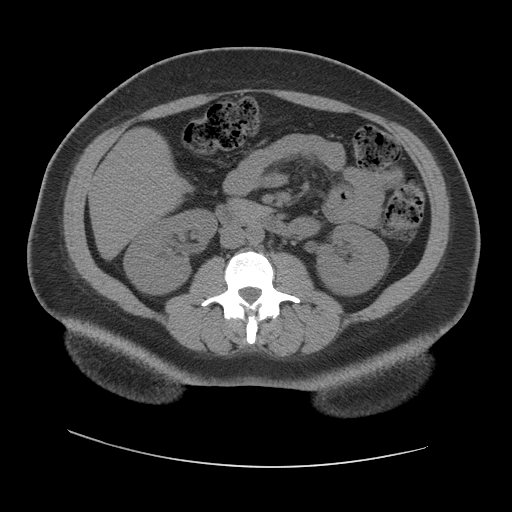
[im 59/90  soft-tissue]
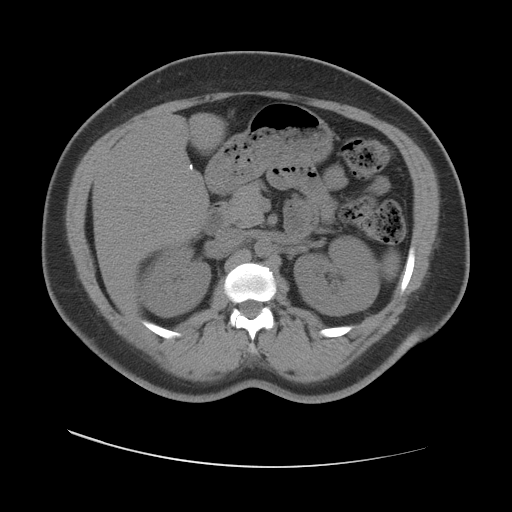
[im 59/90  bone]
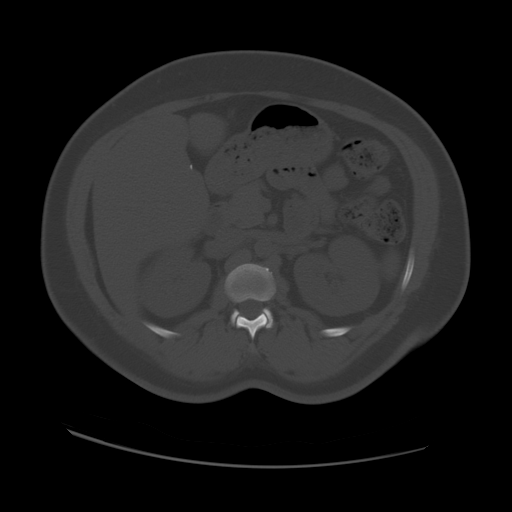
[im 66/90  soft-tissue]
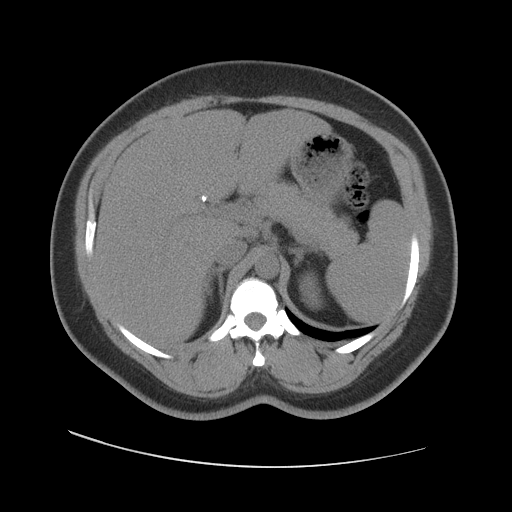
[im 70/90  soft-tissue]
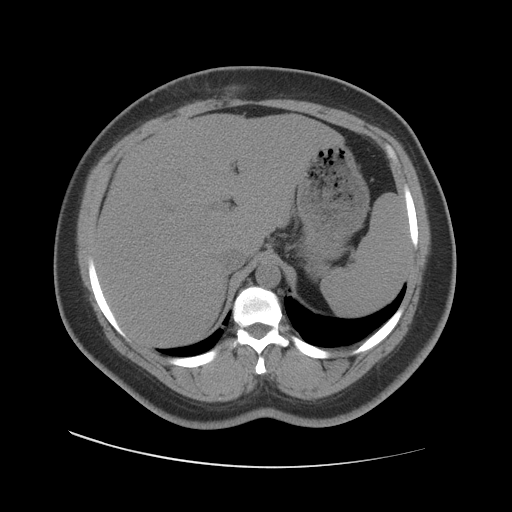
[im 74/90  lung]
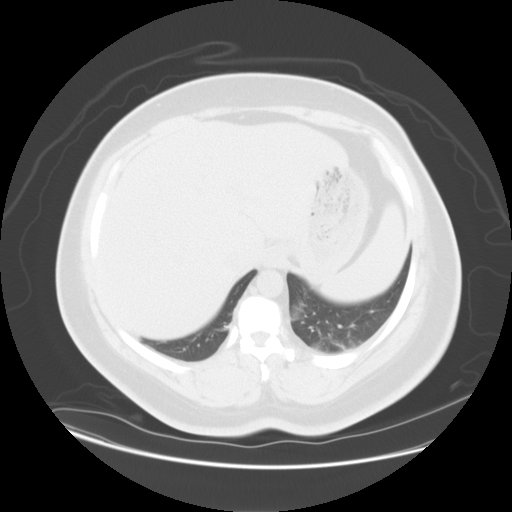
[im 78/90  soft-tissue]
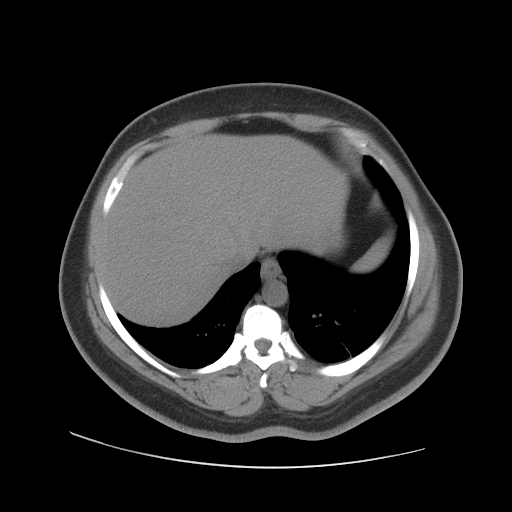
[im 78/90  lung]
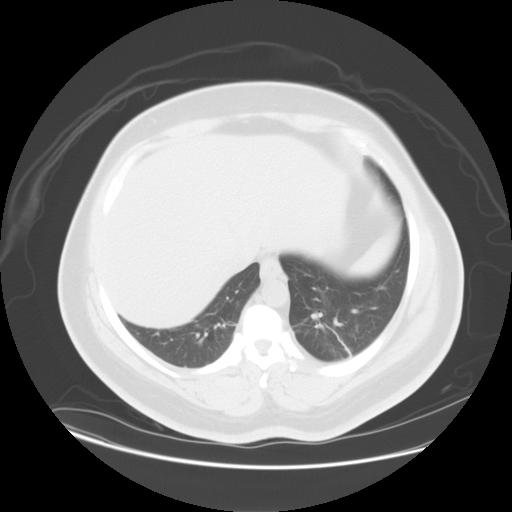
[im 82/90  lung]
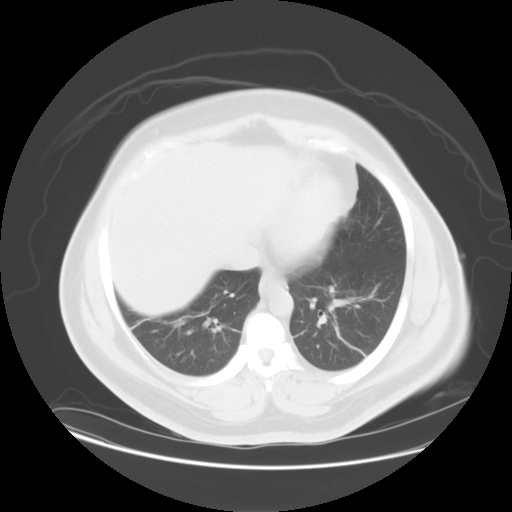
[im 86/90  soft-tissue]
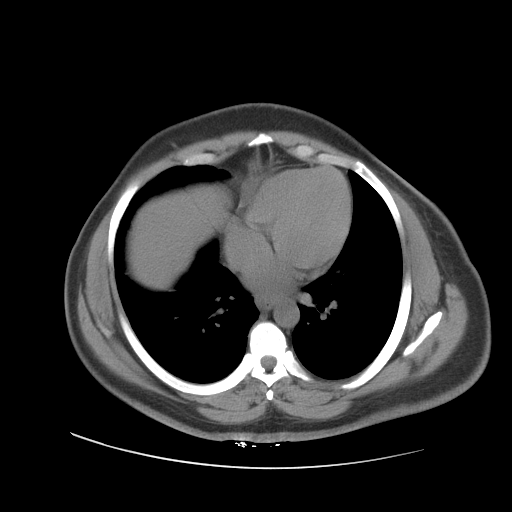
[im 86/90  lung]
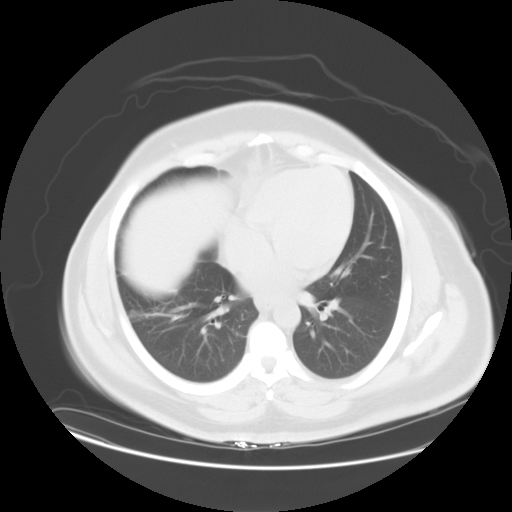

[15 of 32 positions shown; findings below may reference images not displayed]

CT ABDOMEN AND PELVIS WITHOUT CONTRAST:

Multidetector helical CT imaging of abdomen and pelvis performed using kidney
stone protocol. 
Neither oral nor intravenous contrast utilized for this indication.

CT ABDOMEN:

Minimal subsegmental atelectasis versus scarring in both lower lobes.
Status post cholecystectomy.
No urinary tract calcification, hydronephrosis, or ureteral dilatation.
Normal appendix.
Single diverticulum ascending colon.
Question tiny low attenuation focus left kidney, poorly defined, approximately 7
mm diameter image 36.
Remainder of liver, spleen, pancreas, kidneys, and adrenal glands normal.
No mass, adenopathy, free fluid, or inflammatory process.
IMPRESSION: Cholecystectomy. Tiny cyst left kidney, described report of a previous CT of
07/21/2002 although that exam currently is not available for comparison.

CT PELVIS:

Constipation with increased stool in colon.
No pelvic mass, adenopathy, or free fluid.
Question previous tubal ligation.
Pelvic bowel loops normal appearance.
Small right pelvic phleboliths.
No hernia.
IMPRESSION: Mild constipation.

## 2006-10-17 IMAGING — CR DG CHEST 2V
2 series · 2 of 2 positions shown · non-contrast
Comparison: None

CLINICAL DATA: Cough, congestion, shortness of breath the

CHEST - 2 VIEW:

[view not recorded (1 of 2)]
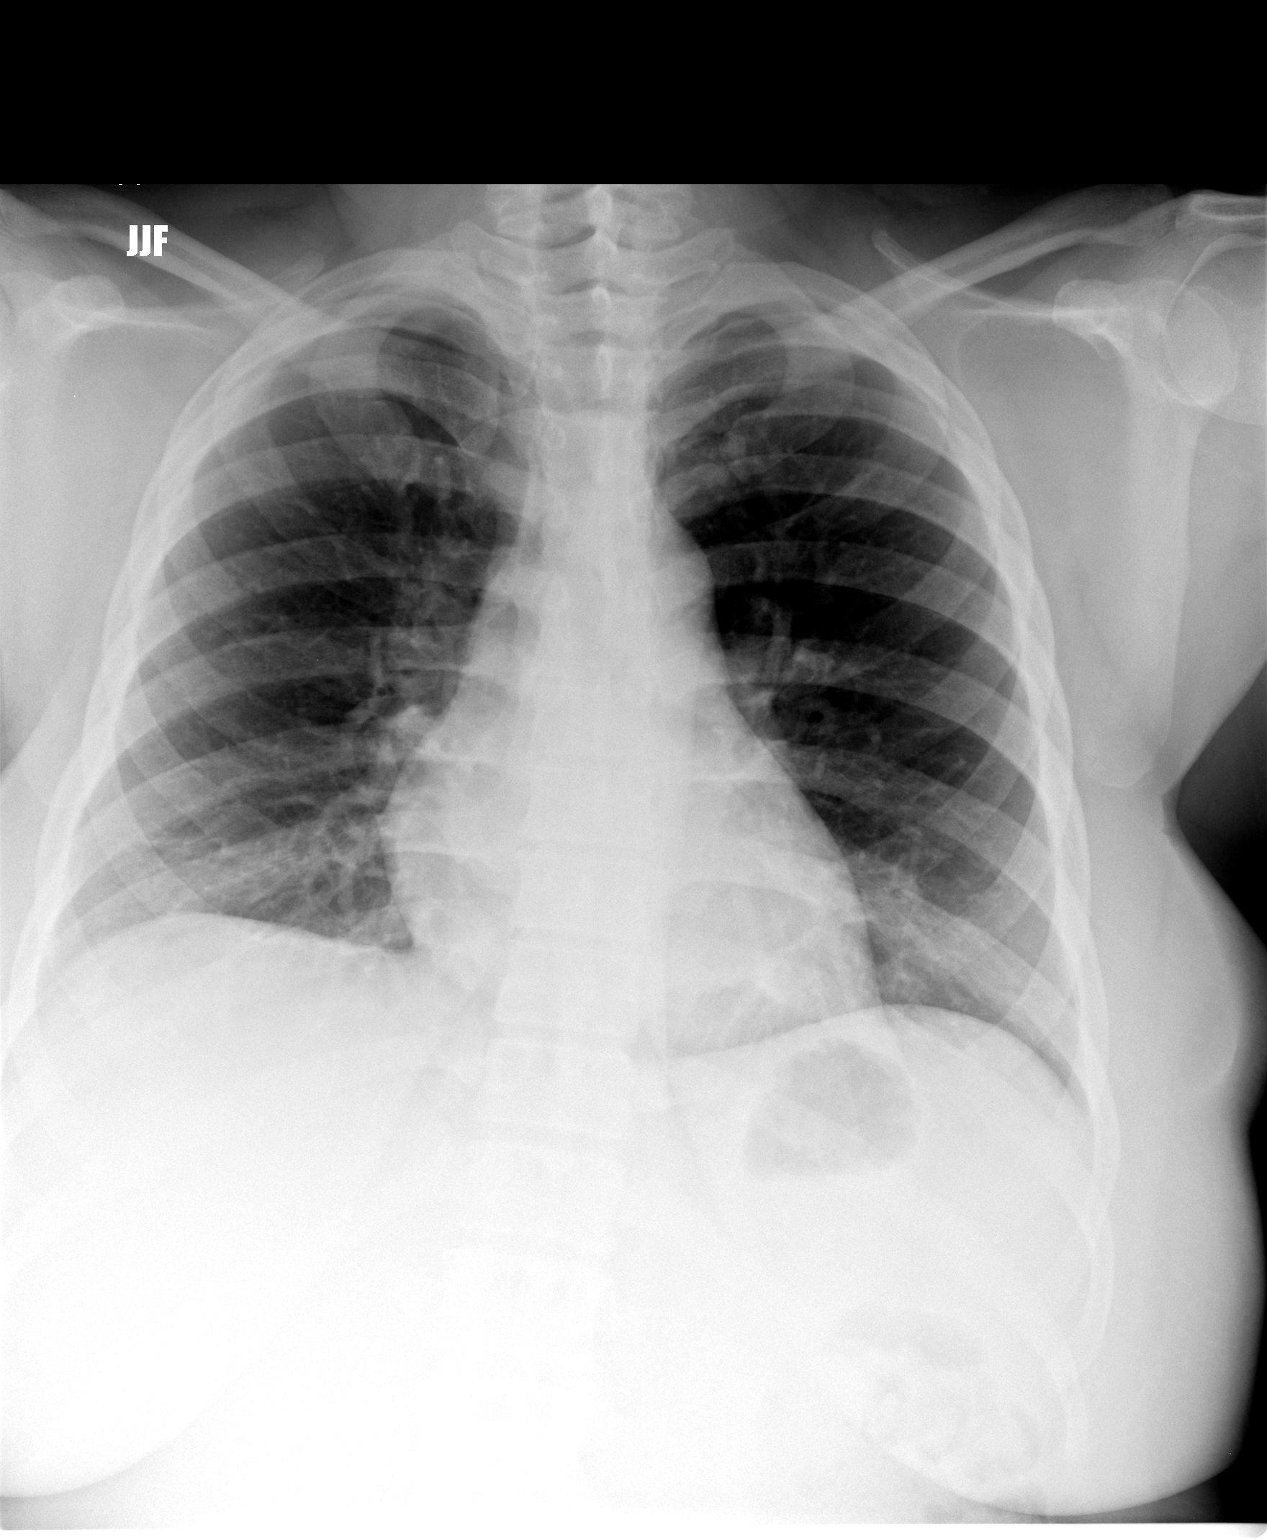

[view not recorded (2 of 2)]
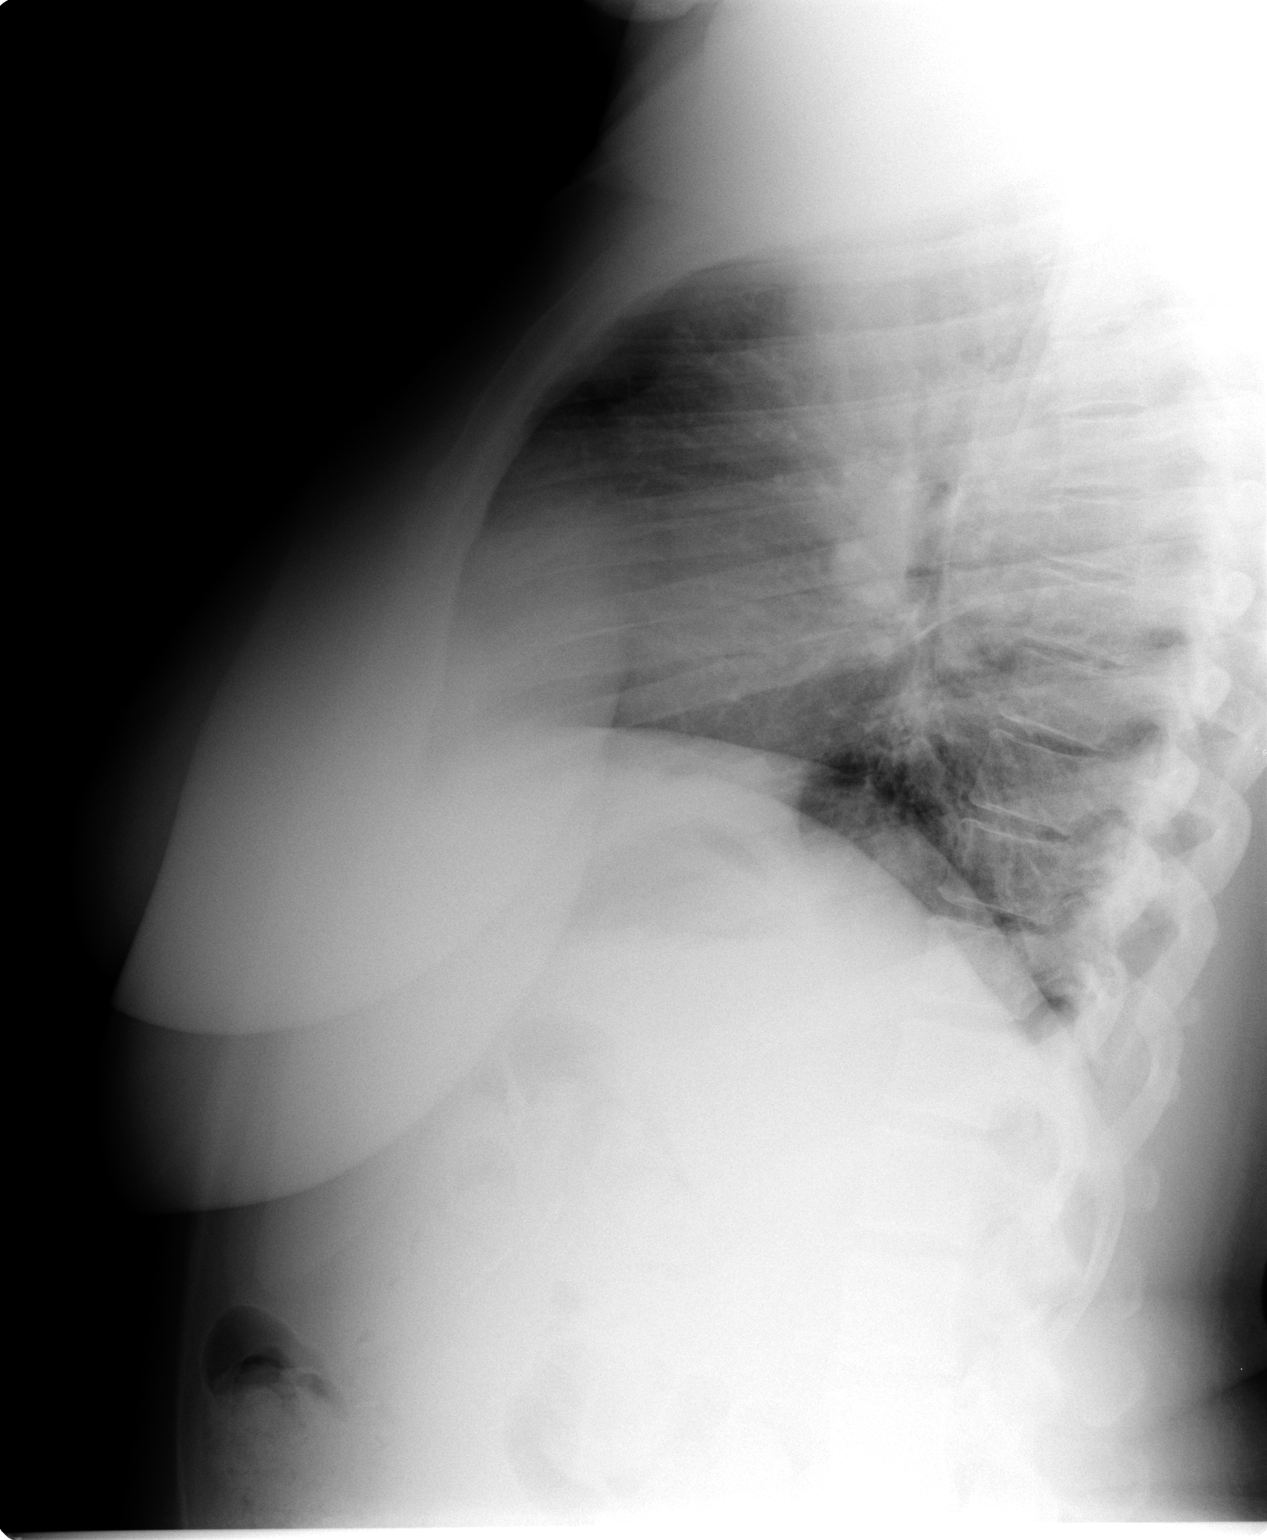

[2 of 2 positions shown; findings below may reference images not displayed]

FINDINGS: The heart size and mediastinal contours are within normal limits. 
There is bibasilar atelectasis. Mild peribronchial thickening noted. .  The
visualized skeletal structures are unremarkable.
IMPRESSION: Mild bronchitic changes. Bibasilar atelectasis.

## 2006-12-24 ENCOUNTER — Emergency Department (HOSPITAL_COMMUNITY): Admission: EM | Admit: 2006-12-24 | Discharge: 2006-12-24 | Payer: Self-pay | Admitting: *Deleted

## 2007-01-23 ENCOUNTER — Emergency Department (HOSPITAL_COMMUNITY): Admission: EM | Admit: 2007-01-23 | Discharge: 2007-01-23 | Payer: Self-pay | Admitting: *Deleted

## 2007-02-20 ENCOUNTER — Inpatient Hospital Stay (HOSPITAL_COMMUNITY): Admission: EM | Admit: 2007-02-20 | Discharge: 2007-02-23 | Payer: Self-pay | Admitting: Emergency Medicine

## 2007-05-22 IMAGING — CT CT PELVIS W/ CM
2 of 6 series · 13 of 32 positions shown, 19 images · IV contrast (APPLIED)
Comparison: 06/08/04.

CLINICAL DATA: Abdominal pain. 
 ABDOMEN CT WITH CONTRAST:
TECHNIQUE: Multidetector CT imaging of the abdomen was performed following the standard protocol with oral contrast and during bolus administration of intravenous contrast.
 Contrast:  150 cc Omnipaque 300.  Oral contrast.
TECHNIQUE: Multidetector CT imaging of the pelvis was performed following the standard protocol with oral contrast and during bolus administration of intravenous contrast.
 No free fluid.  Patient is status-post bilateral tubal ligation.  Bladder is unremarkable.  Uterus and adnexal regions are within normal limits by CT.

[Series 2: abd/pelv with 5.0 b30f st · axial · 0.93mm/px · z∈[+1008,+1063]mm · 3 of 29 slices shown (1 of 2)]
[im 6/29  soft-tissue]
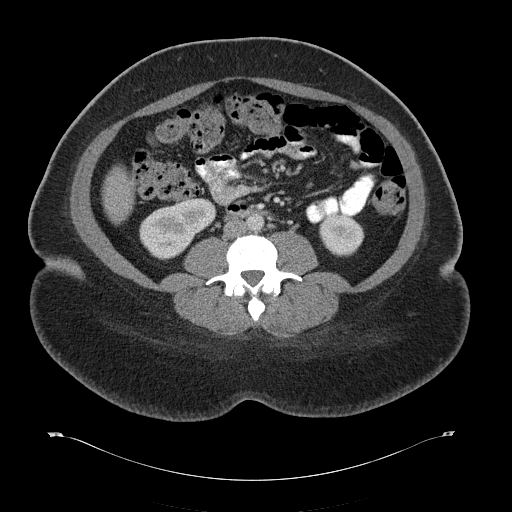
[im 12/29  soft-tissue]
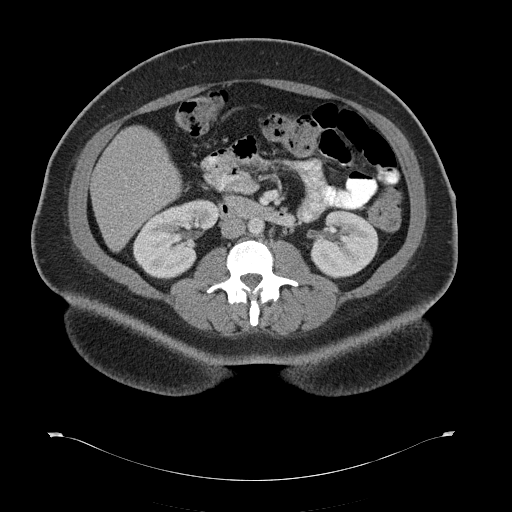
[im 17/29  soft-tissue]
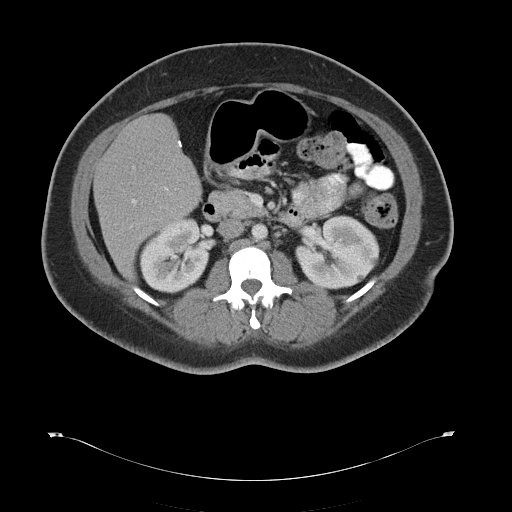

[Series 8: abd/pelv with 5.0 b30f st · axial · 0.93mm/px · z∈[+789,+1019]mm · 10 of 58 slices shown, 16 images (2 of 2)]
[im 6/58  soft-tissue]
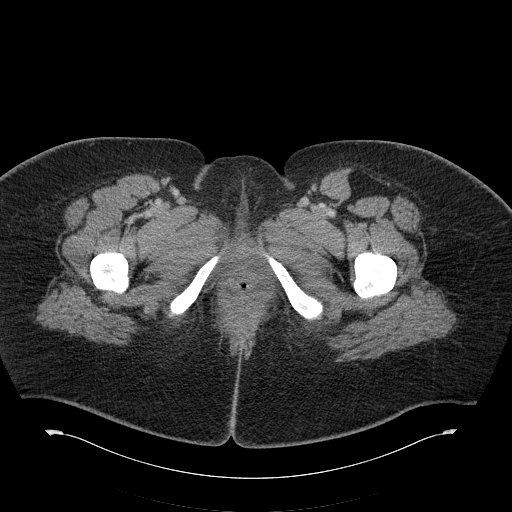
[im 6/58  bone]
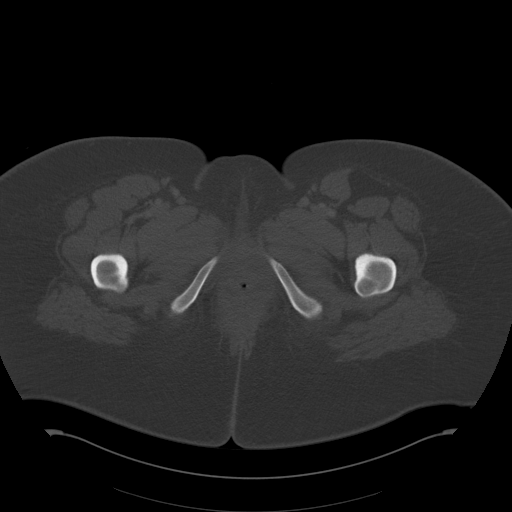
[im 11/58  soft-tissue]
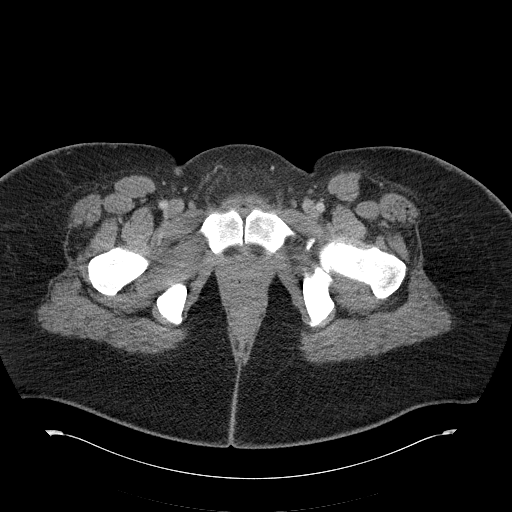
[im 16/58  soft-tissue]
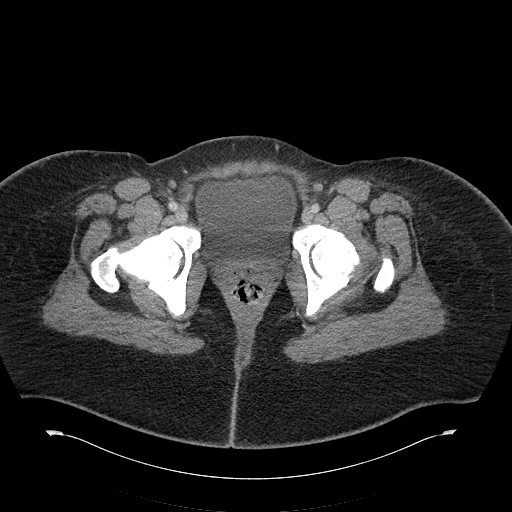
[im 21/58  soft-tissue]
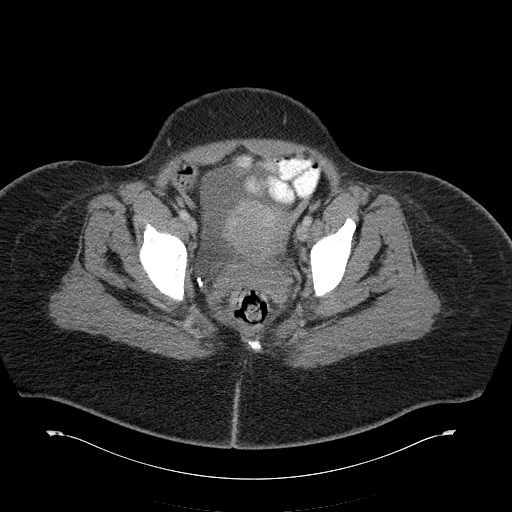
[im 26/58  soft-tissue]
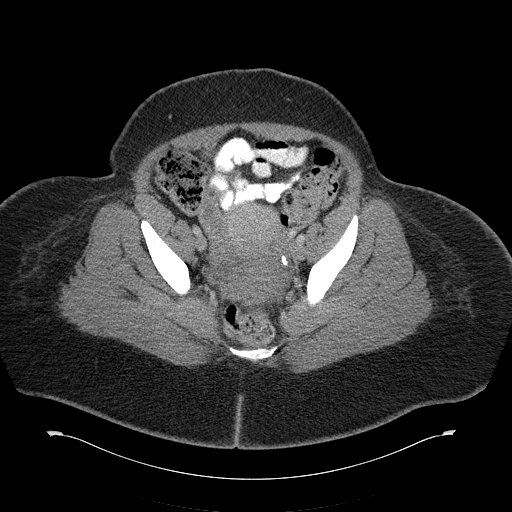
[im 32/58  soft-tissue]
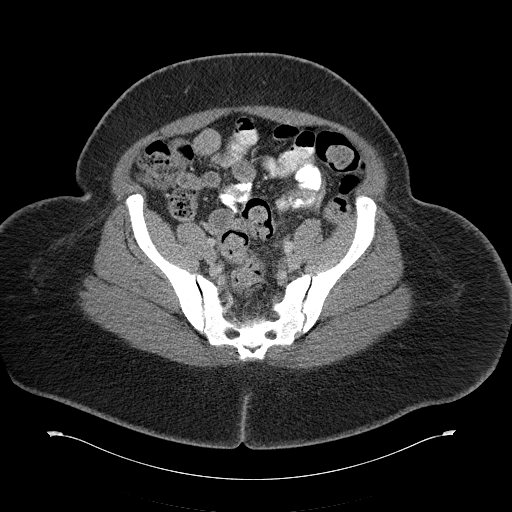
[im 37/58  soft-tissue]
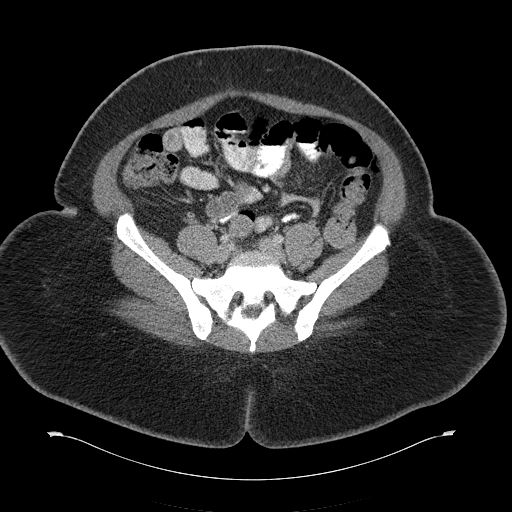
[im 37/58  lung]
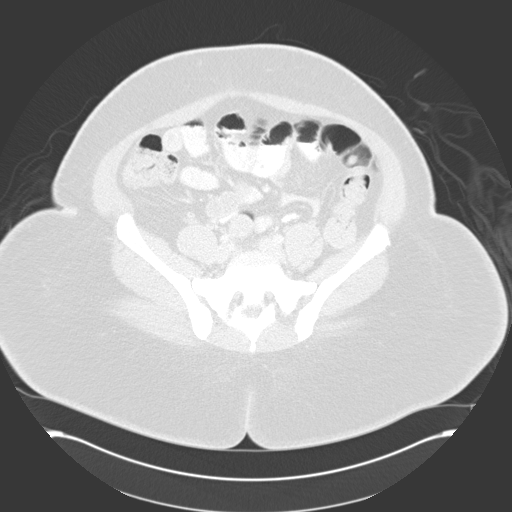
[im 42/58  soft-tissue]
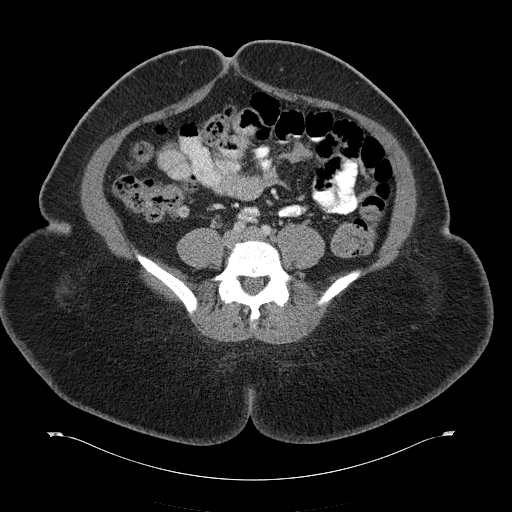
[im 42/58  lung]
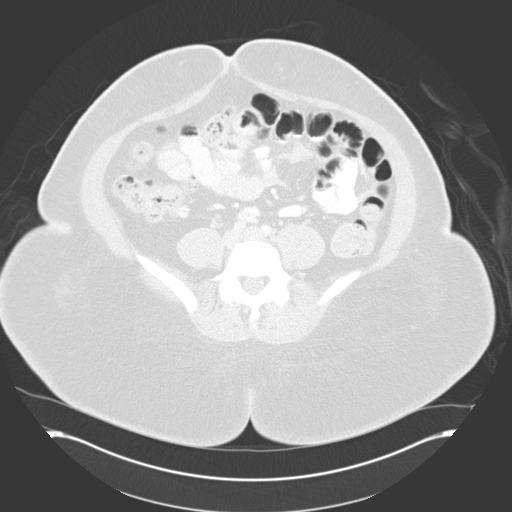
[im 47/58  soft-tissue]
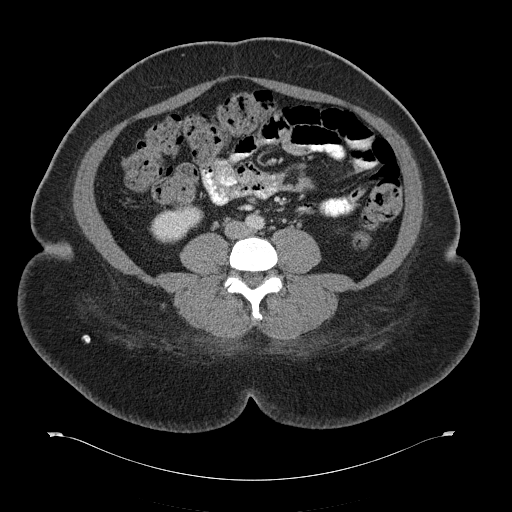
[im 47/58  lung]
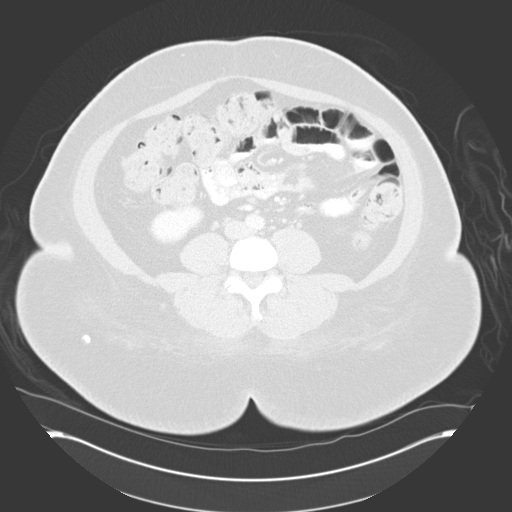
[im 47/58  bone]
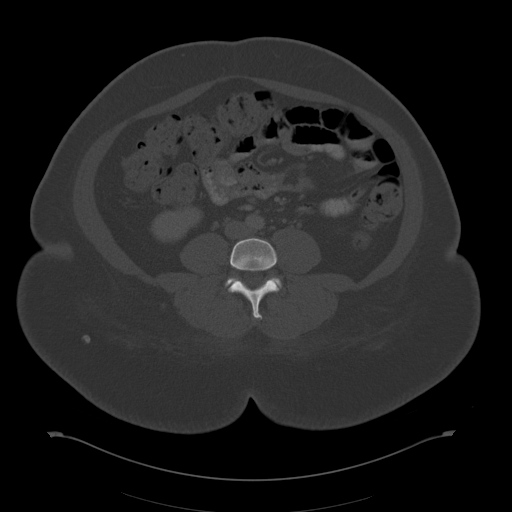
[im 52/58  soft-tissue]
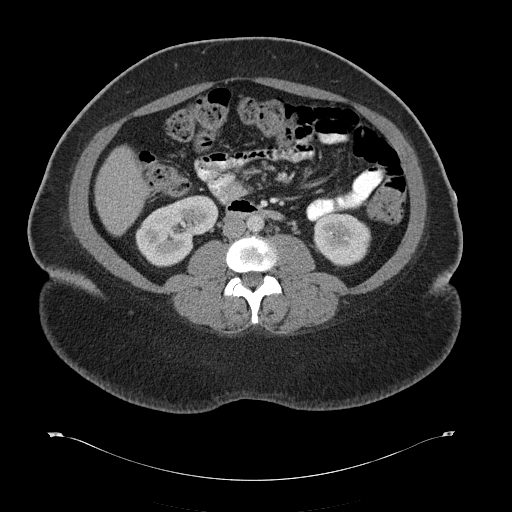
[im 52/58  lung]
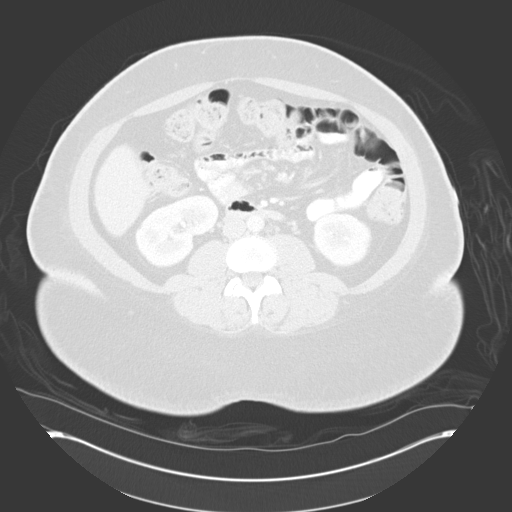

[13 of 32 positions shown; findings below may reference images not displayed]

Visualized lung bases show bibasilar scarring and atelectasis.  The liver, spleen, pancreas, adrenal glands, and kidneys are within normal limits.  The gallbladder has been removed.   Bowel loops are of normal caliber.  No inflammatory process, free fluid, or abscess is seen.  No incidental masses or adenopathy.
IMPRESSION: Normal CT of the abdomen. 
 PELVIS CT WITH CONTRAST:
IMPRESSION: No acute findings in the pelvis.

## 2007-05-26 ENCOUNTER — Emergency Department (HOSPITAL_COMMUNITY): Admission: EM | Admit: 2007-05-26 | Discharge: 2007-05-26 | Payer: Self-pay | Admitting: Emergency Medicine

## 2007-11-16 ENCOUNTER — Emergency Department (HOSPITAL_COMMUNITY): Admission: EM | Admit: 2007-11-16 | Discharge: 2007-11-16 | Payer: Self-pay | Admitting: Emergency Medicine

## 2008-02-20 IMAGING — CR DG ABDOMEN ACUTE W/ 1V CHEST
3 series · 3 of 3 positions shown · non-contrast
Comparison: none

CLINICAL DATA: Abdominal pain.  Acute abdominal series.
 ACUTE ABDOMEN SERIES:

[w chest pa]
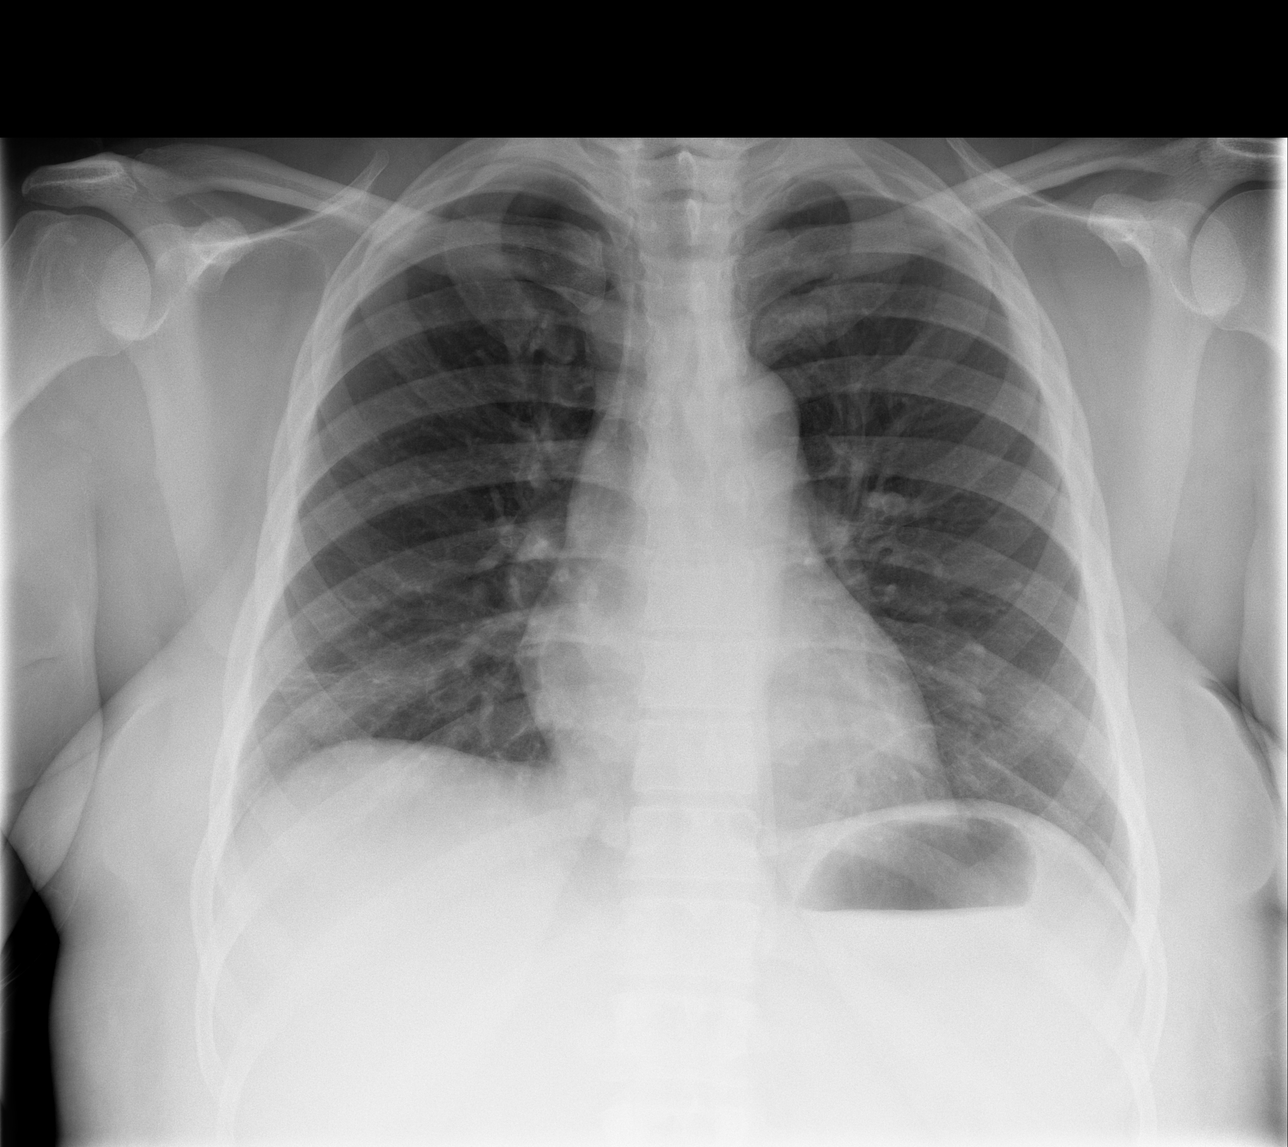

[w abdomen upright *]
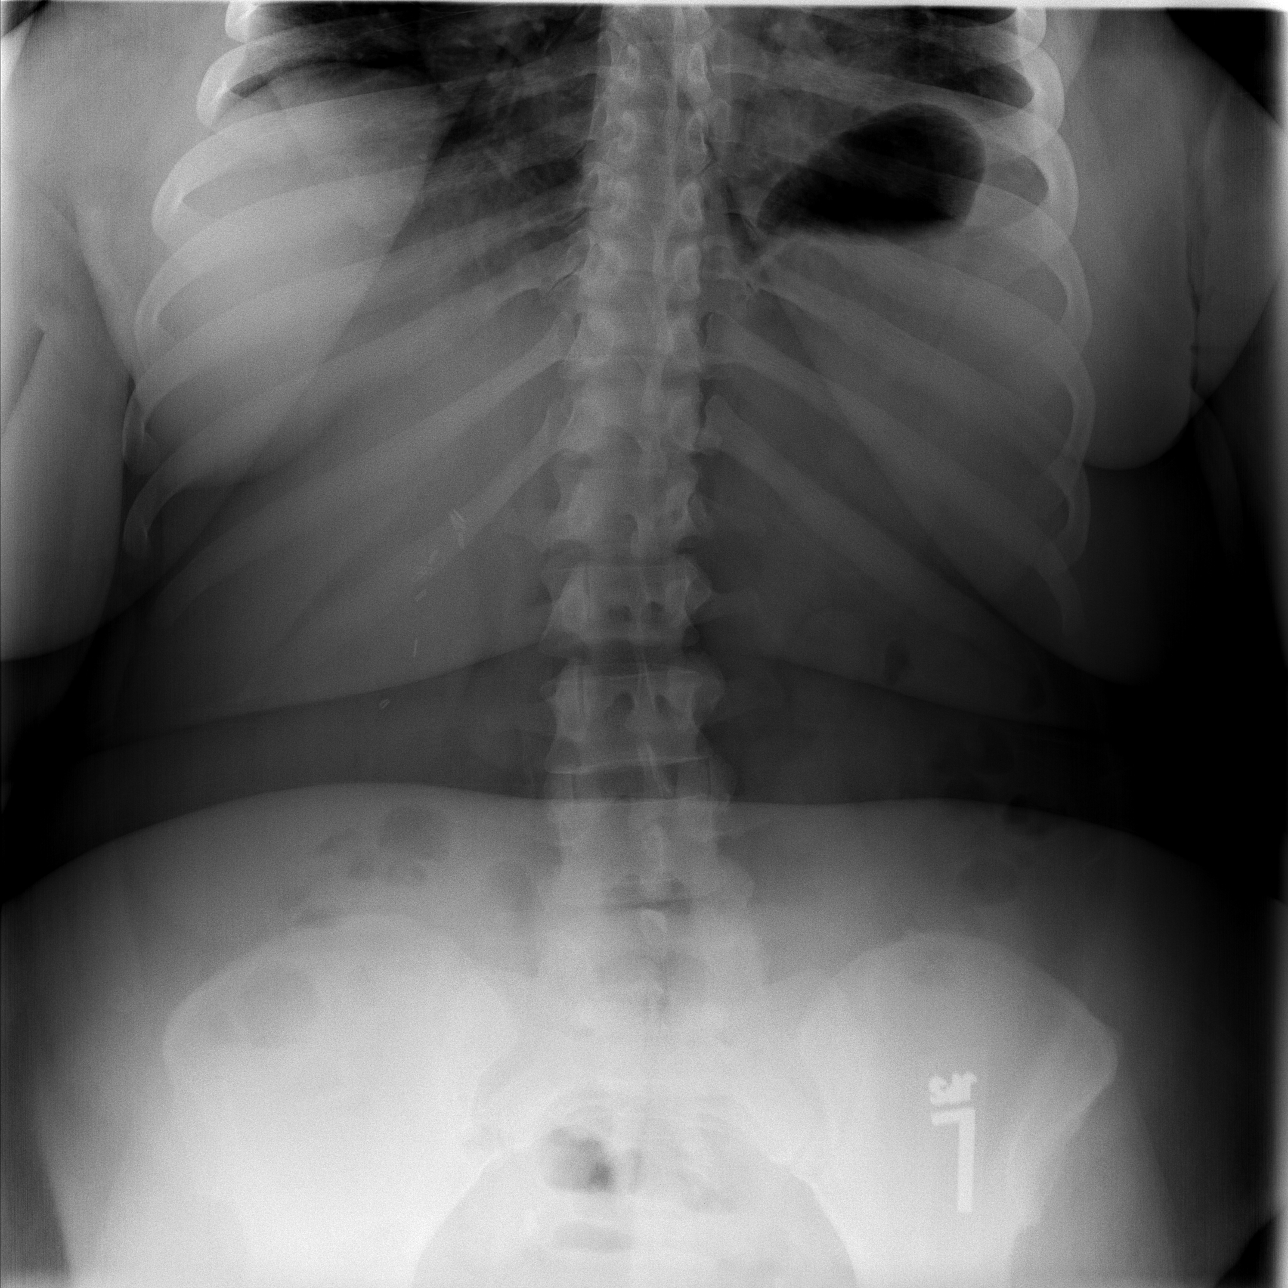

[t abdomen supine]
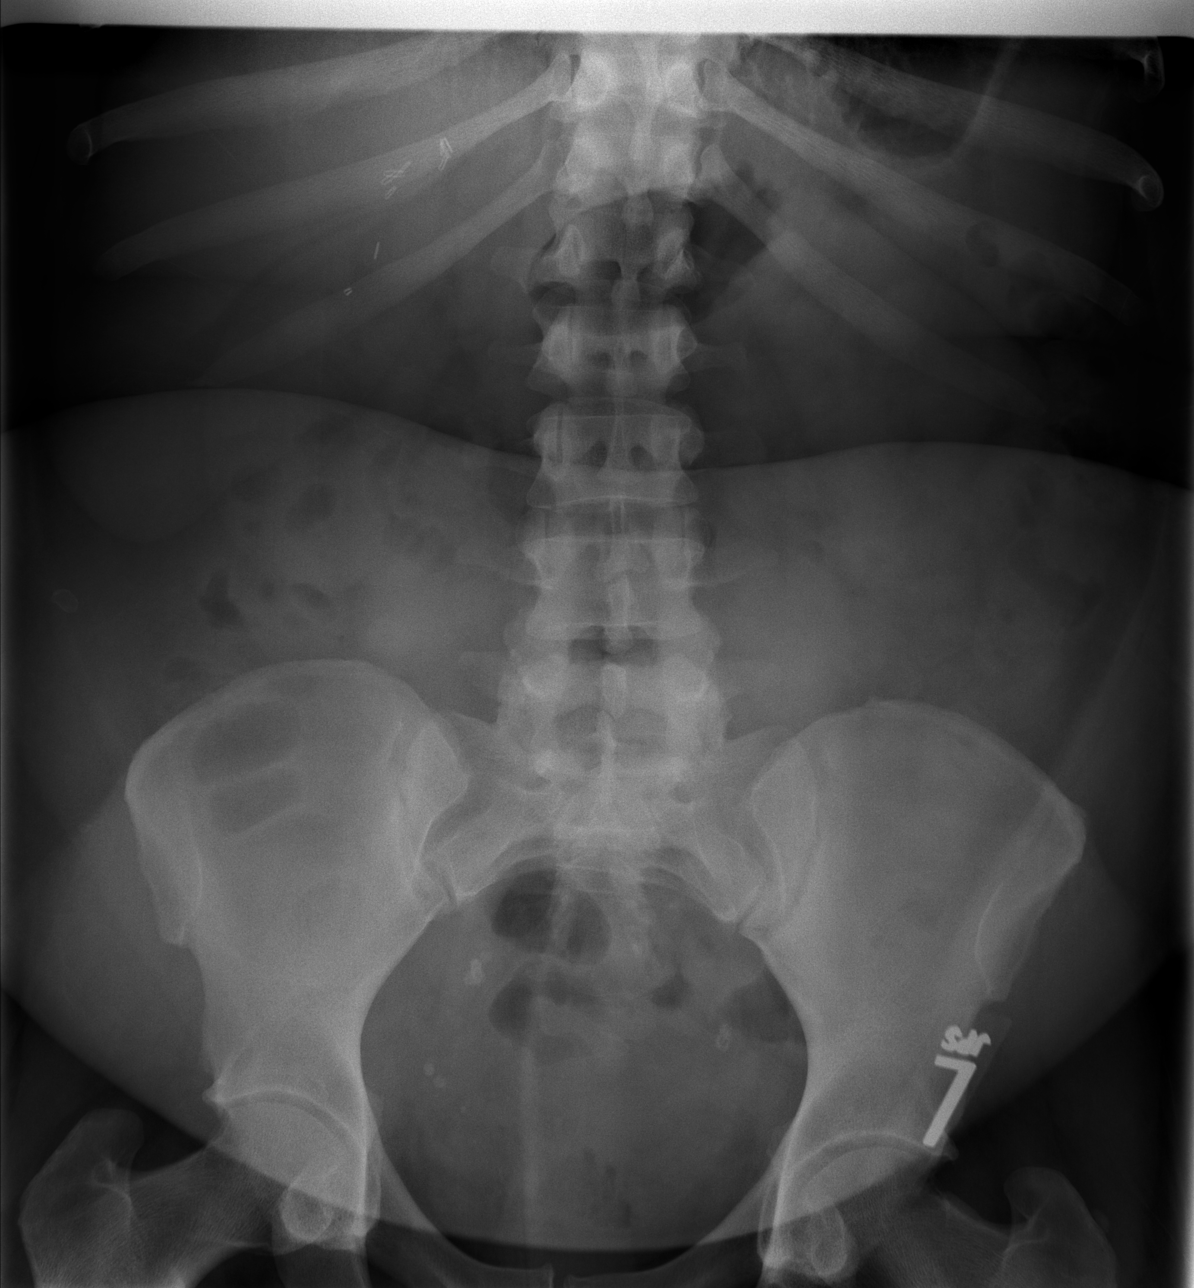

[3 of 3 positions shown; findings below may reference images not displayed]

FINDINGS: A chest x-ray shows mild right lower lobe atelectasis unchanged from 06/29/04.  Left lung is clear.  There is no heart failure or effusion.
 Flat and upright views of the abdomen reveal normal bowel gas pattern.  No obstruction or free air.  There are clips in the gallbladder fossa.
IMPRESSION: No acute abnormality.

## 2008-04-05 ENCOUNTER — Ambulatory Visit: Payer: Self-pay | Admitting: Cardiology

## 2008-04-05 ENCOUNTER — Inpatient Hospital Stay (HOSPITAL_COMMUNITY): Admission: EM | Admit: 2008-04-05 | Discharge: 2008-04-08 | Payer: Self-pay | Admitting: Emergency Medicine

## 2008-04-06 ENCOUNTER — Encounter (INDEPENDENT_AMBULATORY_CARE_PROVIDER_SITE_OTHER): Payer: Self-pay | Admitting: Internal Medicine

## 2008-07-10 IMAGING — CR DG CHEST 2V
2 series · 2 of 2 positions shown · non-contrast
Comparison: 03/21/06.

CLINICAL DATA: Pneumonia.
 CHEST - 2 VIEWS:

[view not recorded (1 of 2)]
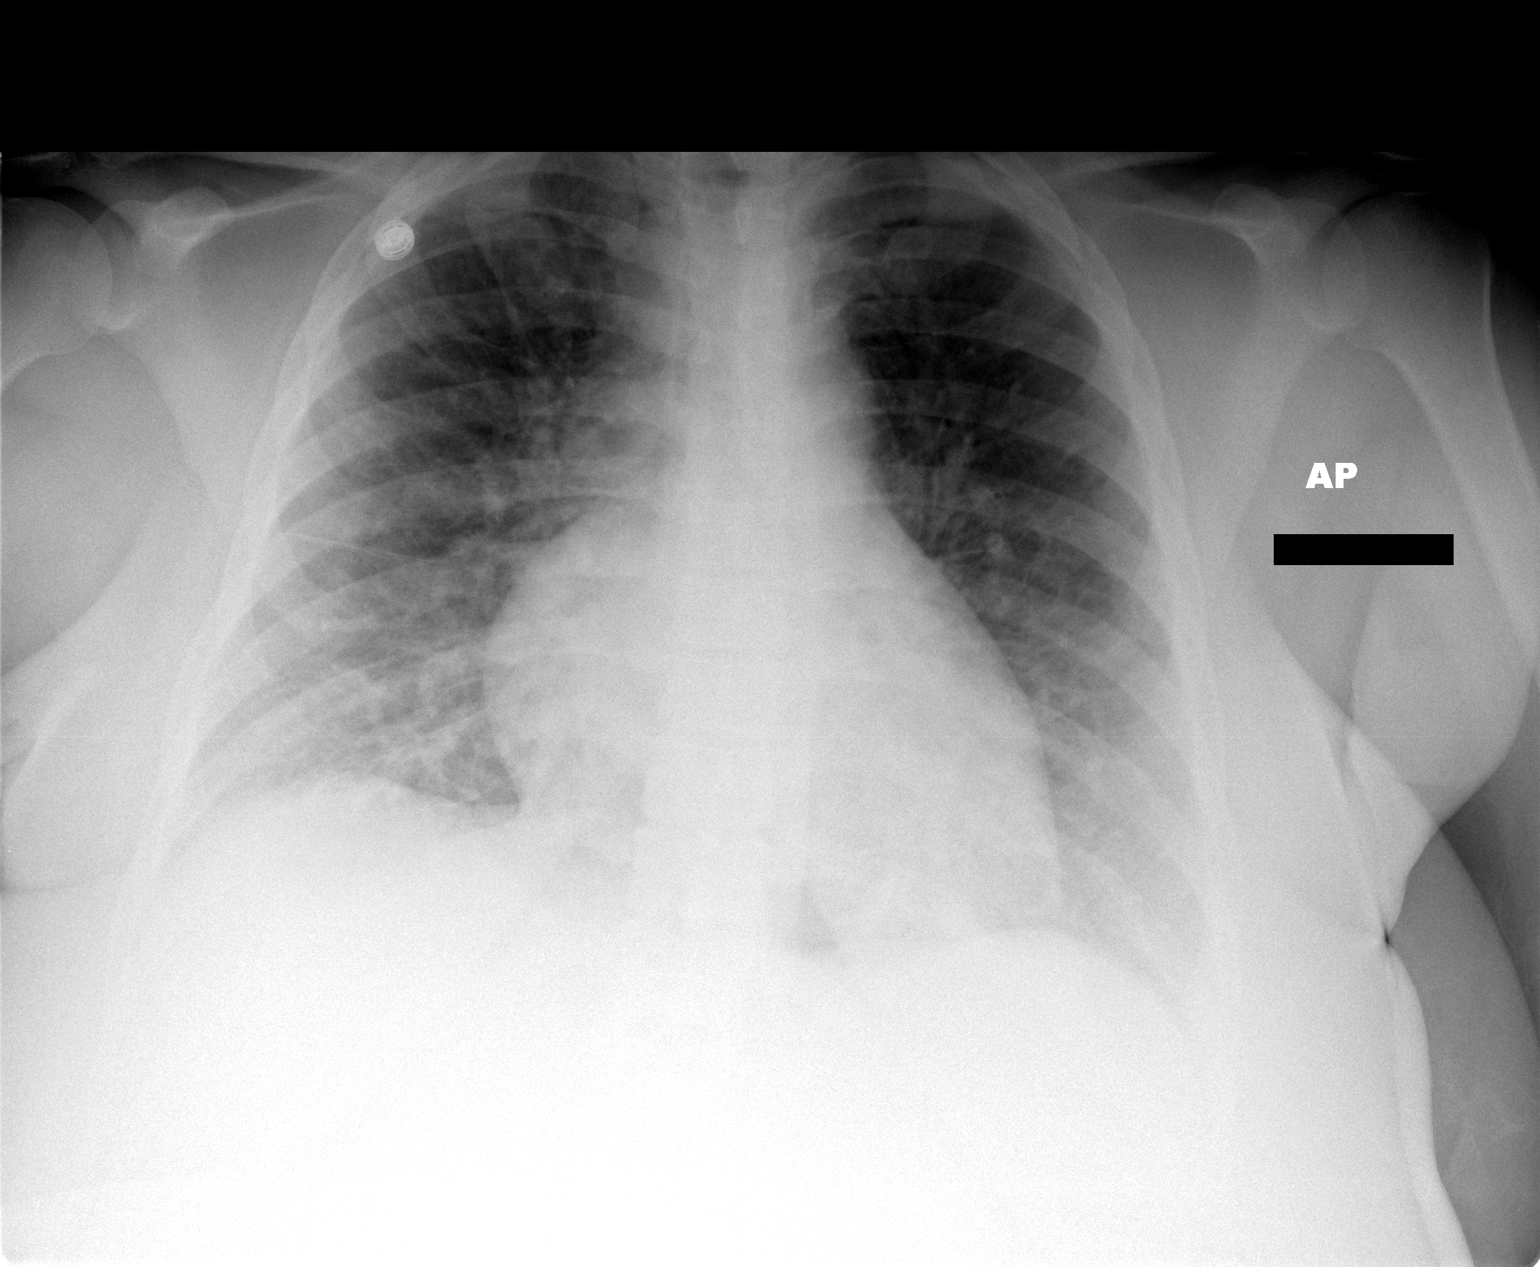

[view not recorded (2 of 2)]
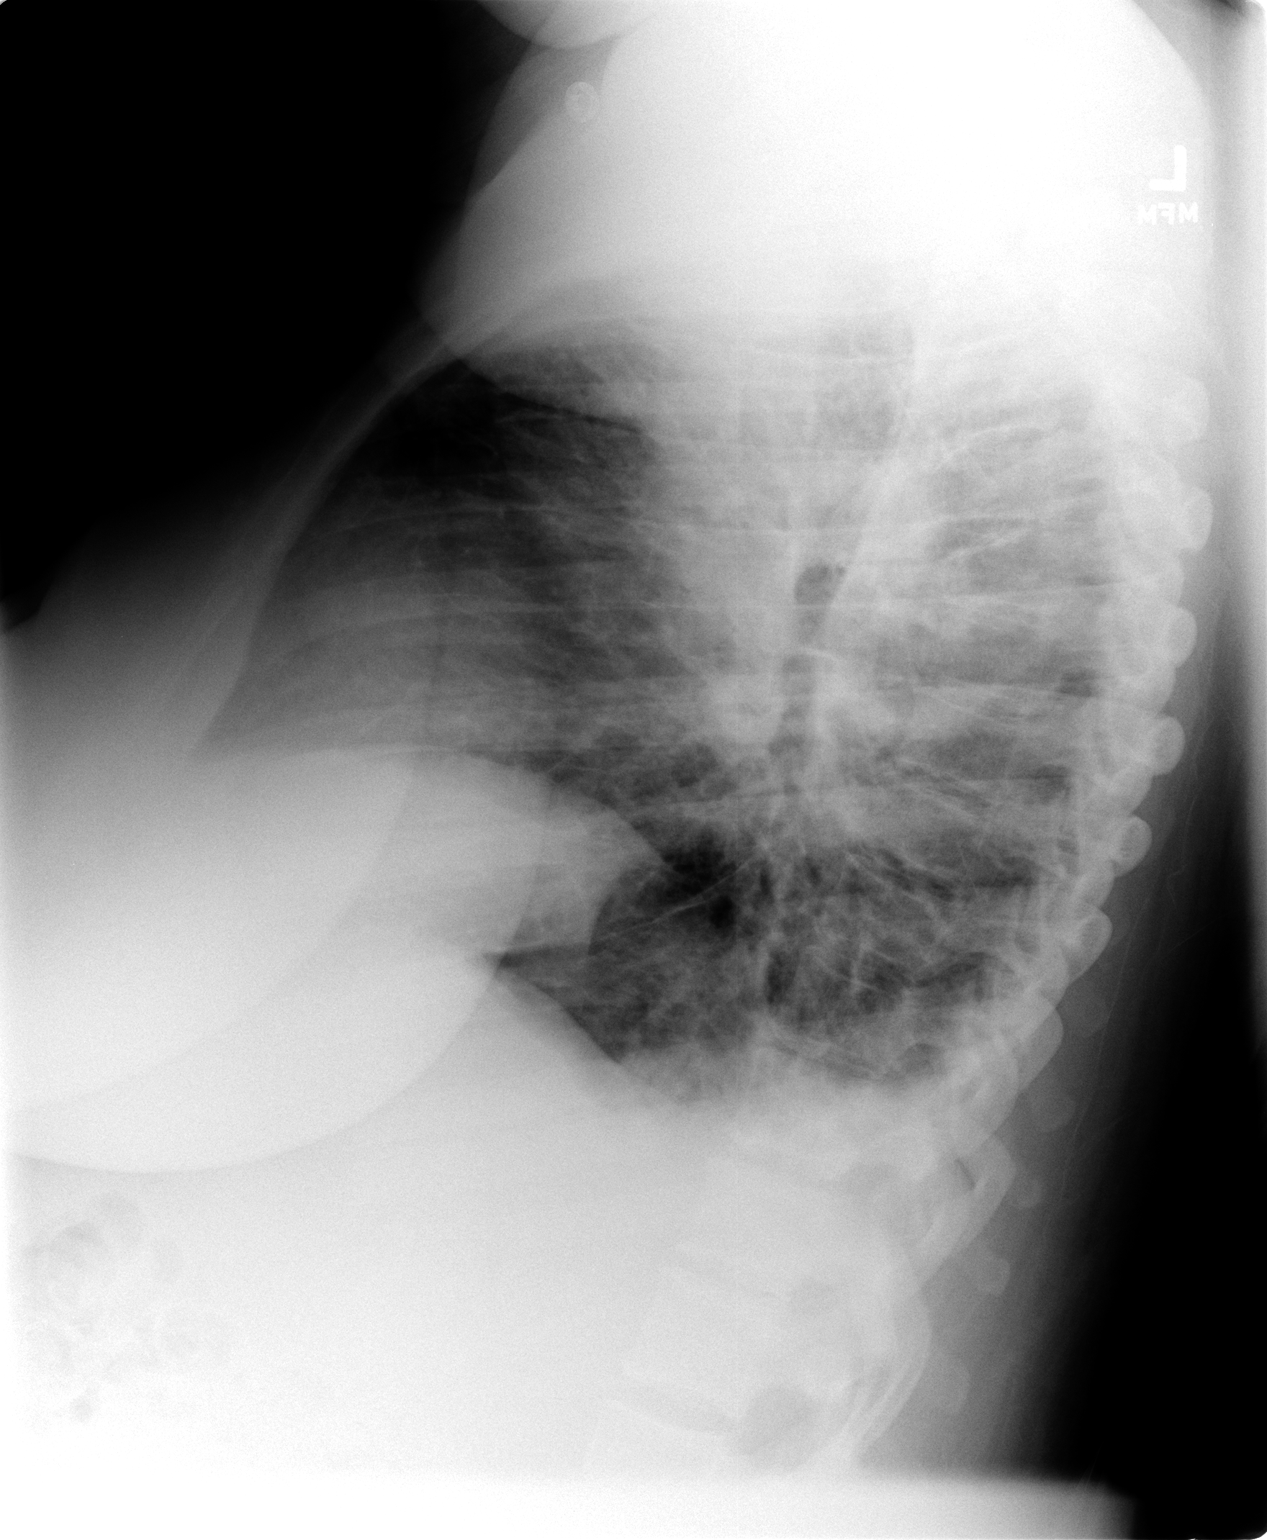

[2 of 2 positions shown; findings below may reference images not displayed]

FINDINGS: There is worsening lung density bilaterally which appears patchy.  There are also small bilateral pleural effusions.  The findings could be due to bronchopneumonia and atelectasis.  There may also be an element of fluid overload/edema.
IMPRESSION: Some worsening in patchy lung density bilaterally.

## 2008-07-10 IMAGING — US US SOFT TISSUE HEAD/NECK
1 series · 14 of 25 positions shown · non-contrast
Comparison: none

CLINICAL DATA: Possible nodule.
 THYROID ULTRASOUND:
TECHNIQUE: Ultrasound examination of the thyroid gland and adjacent soft tissue structures was performed.

[Series 1: unknown · 0.09mm/px · 14 of 36 slices shown]
[im 1/36]
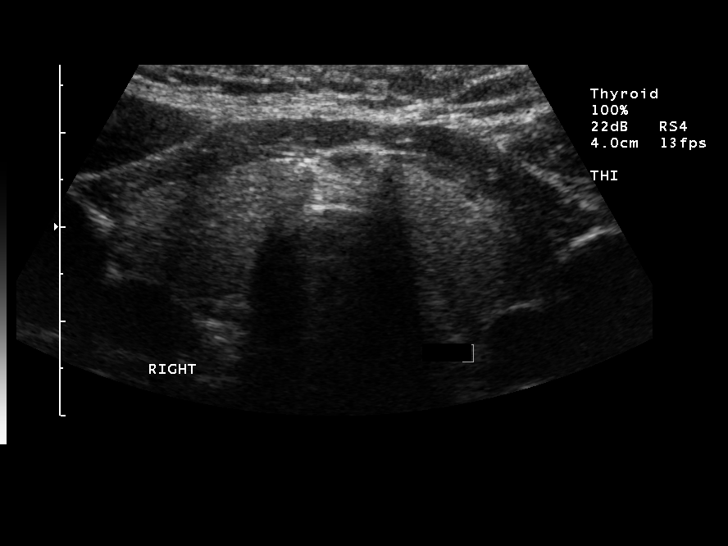
[im 3/36]
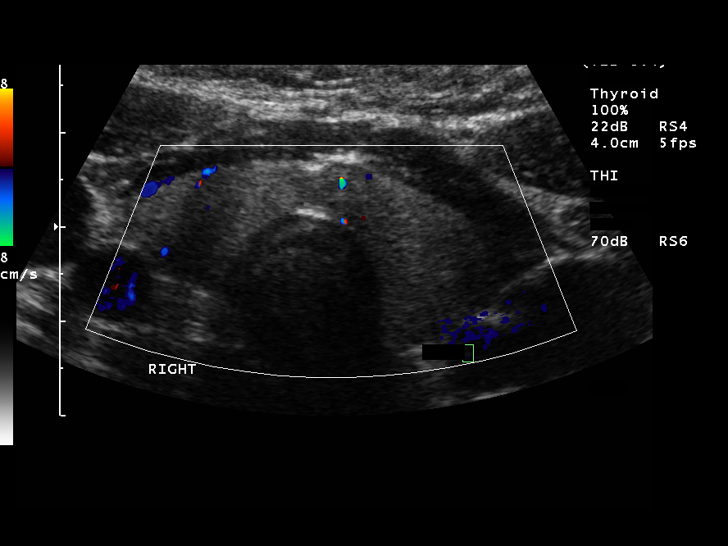
[im 6/36]
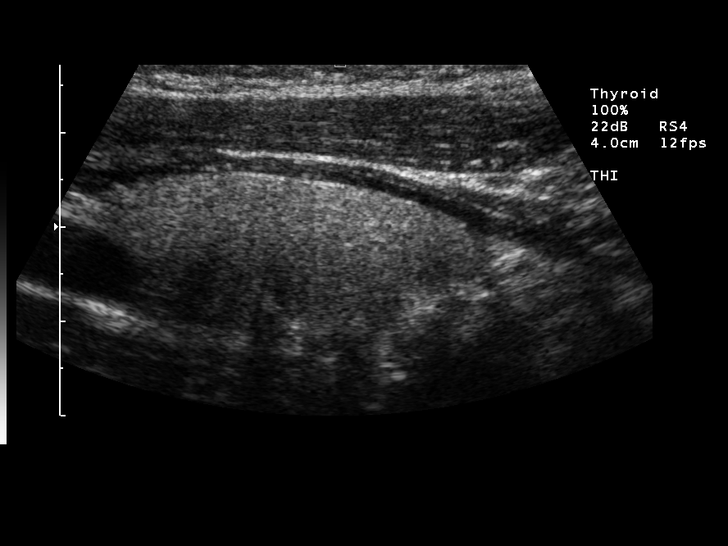
[im 9/36]
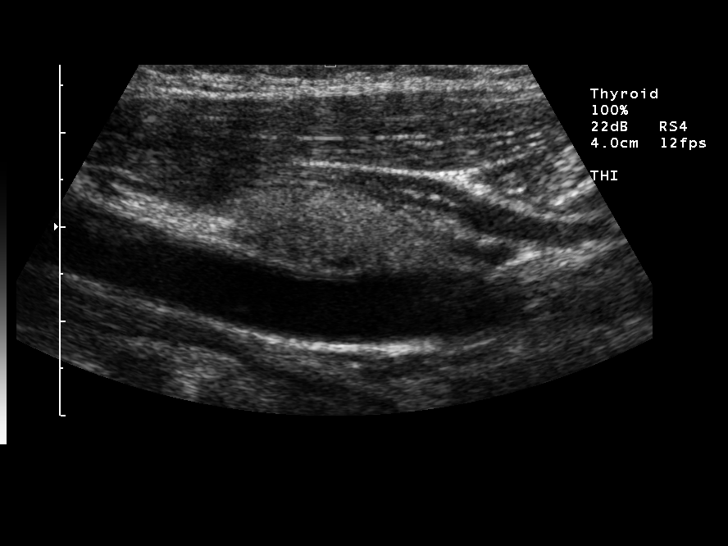
[im 12/36]
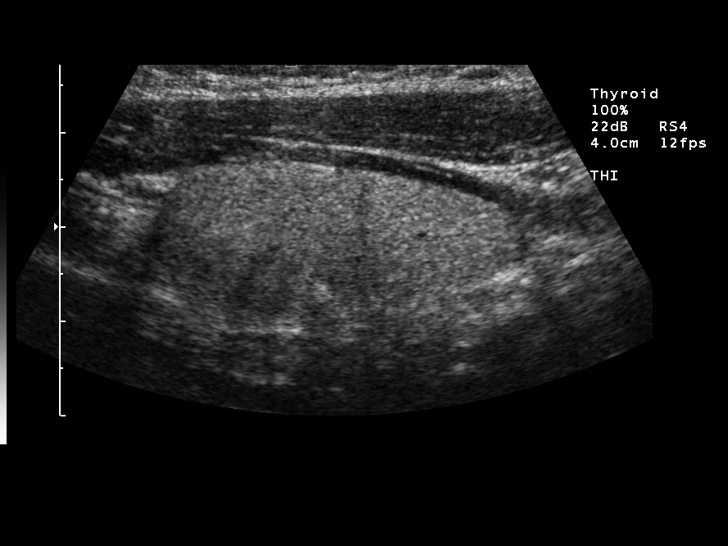
[im 14/36]
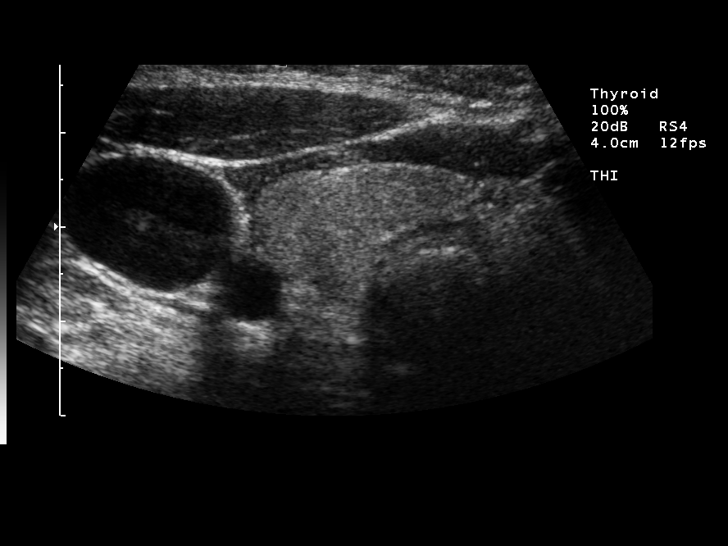
[im 17/36]
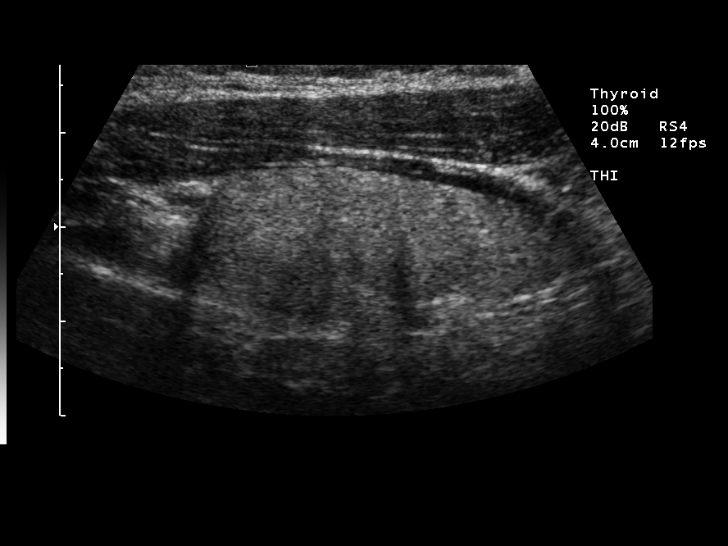
[im 19/36]
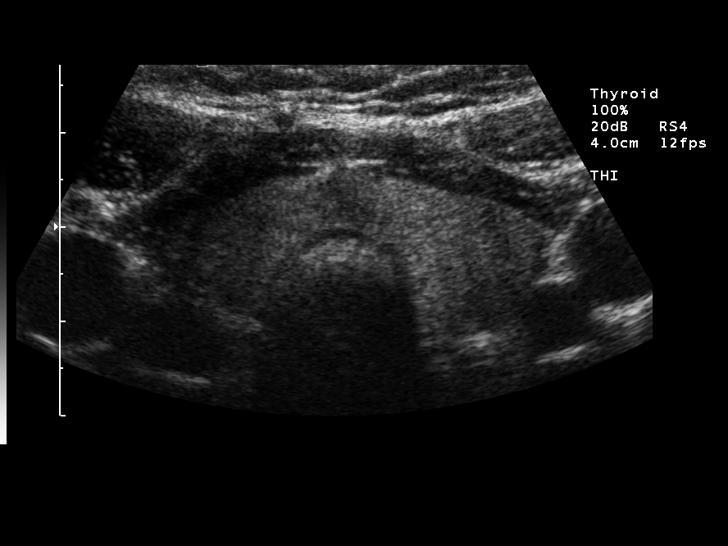
[im 22/36]
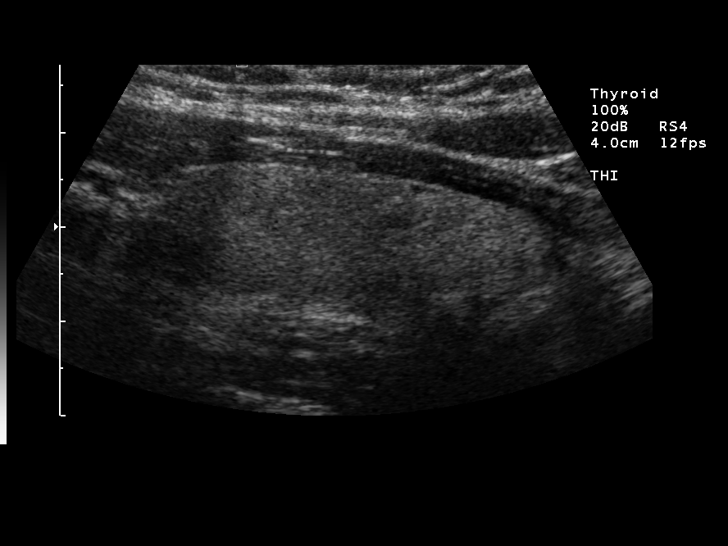
[im 24/36]
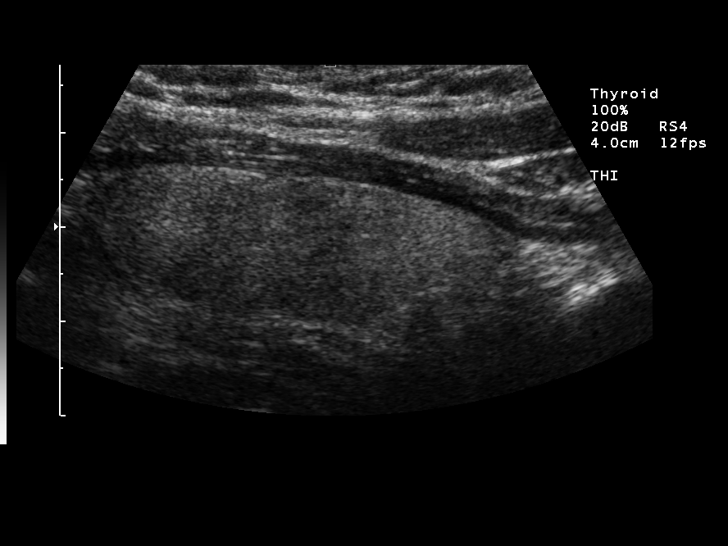
[im 27/36]
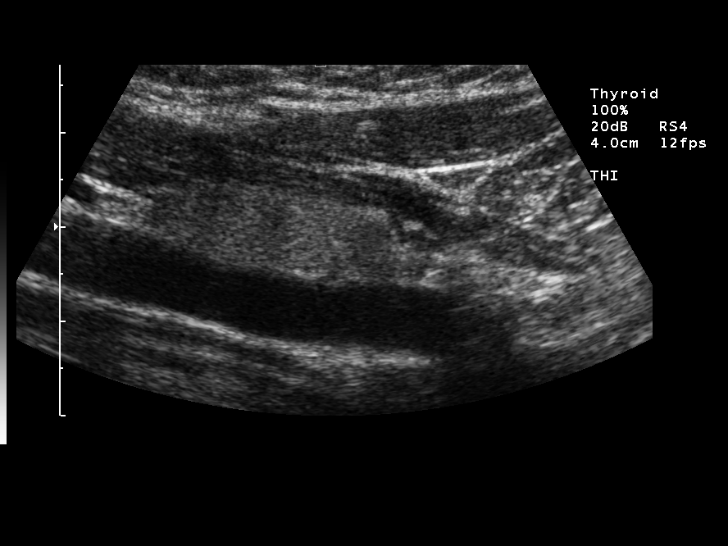
[im 30/36]
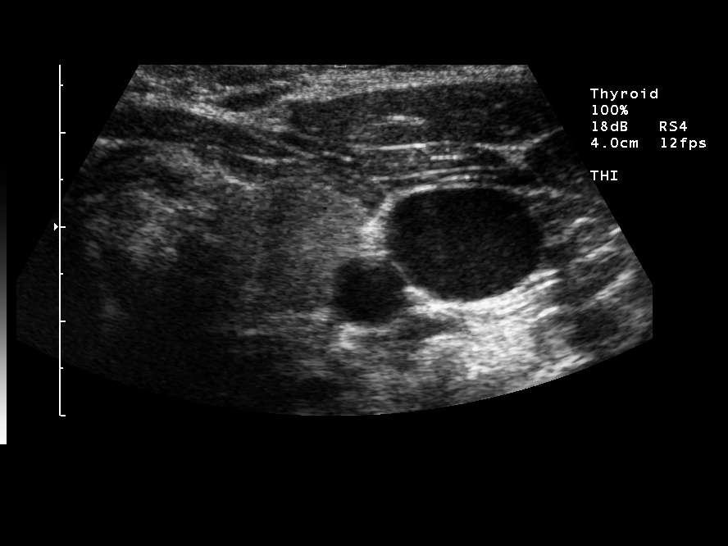
[im 33/36]
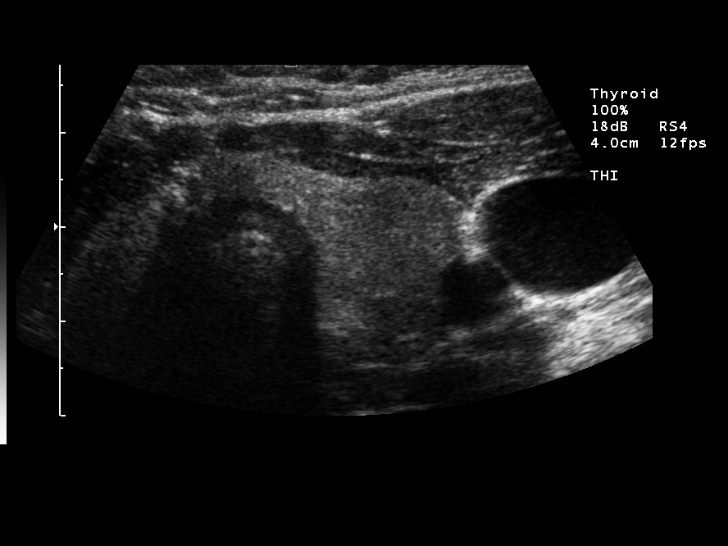
[im 36/36]
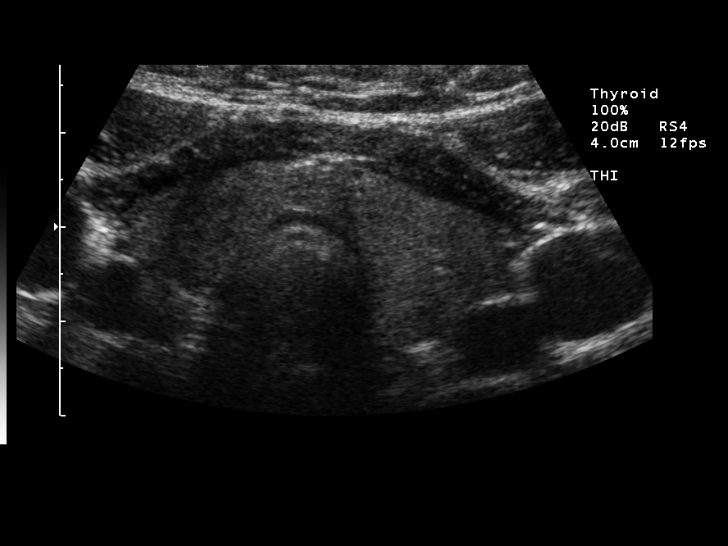

[14 of 25 positions shown; findings below may reference images not displayed]

FINDINGS: The right lobe of the thyroid measures 4.2 x 1.7 x 1.5 cm and the left lobe measures 4.1 x 1.4 x 1.6 cm.  Isthmus measures 0.7 cm in thickness.  Thyroid echo texture is mildly inhomogeneous.  No focal nodule is identified.
IMPRESSION: Negative for focal thyroid abnormality with mild inhomogeneity of echo texture noted.

## 2008-07-13 DIAGNOSIS — IMO0002 Reserved for concepts with insufficient information to code with codable children: Secondary | ICD-10-CM

## 2008-07-13 HISTORY — DX: Reserved for concepts with insufficient information to code with codable children: IMO0002

## 2008-08-28 ENCOUNTER — Emergency Department (HOSPITAL_COMMUNITY): Admission: EM | Admit: 2008-08-28 | Discharge: 2008-08-28 | Payer: Self-pay | Admitting: Emergency Medicine

## 2008-12-06 ENCOUNTER — Emergency Department (HOSPITAL_COMMUNITY): Admission: EM | Admit: 2008-12-06 | Discharge: 2008-12-06 | Payer: Self-pay | Admitting: Emergency Medicine

## 2008-12-10 ENCOUNTER — Emergency Department (HOSPITAL_COMMUNITY): Admission: EM | Admit: 2008-12-10 | Discharge: 2008-12-11 | Payer: Self-pay | Admitting: Emergency Medicine

## 2008-12-20 ENCOUNTER — Emergency Department (HOSPITAL_COMMUNITY): Admission: EM | Admit: 2008-12-20 | Discharge: 2008-12-20 | Payer: Self-pay | Admitting: Emergency Medicine

## 2009-01-07 IMAGING — CR DG WRIST COMPLETE 3+V*R*
4 series · 4 of 4 positions shown · non-contrast
Comparison: None.

CLINICAL DATA: The patient has right arm and right wrist pain.  No injury.  
 RIGHT WRIST ? 3 VIEW:

[view not recorded (1 of 4)]
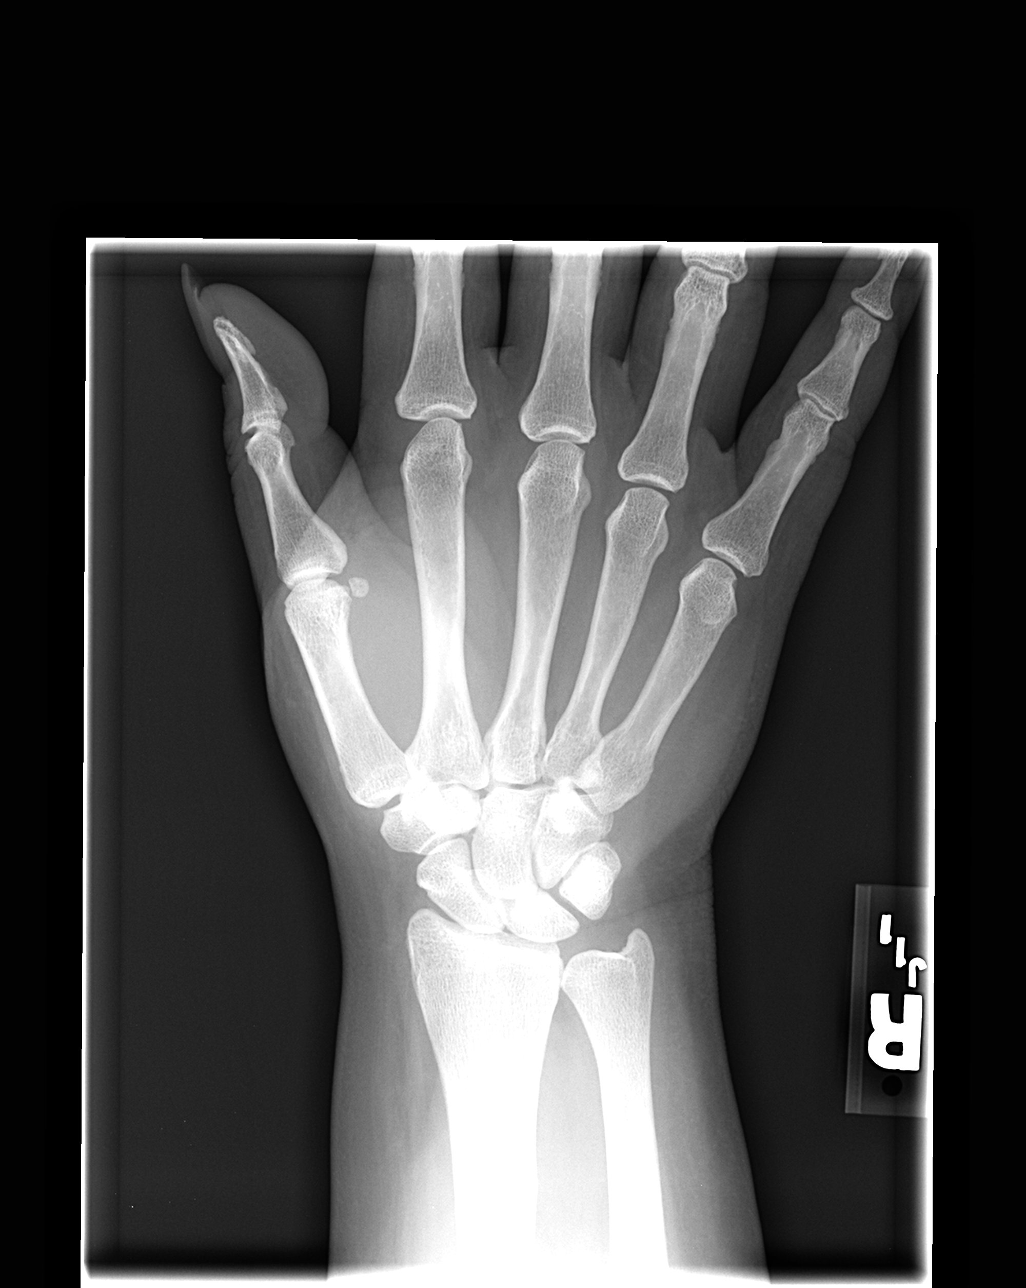

[view not recorded (2 of 4)]
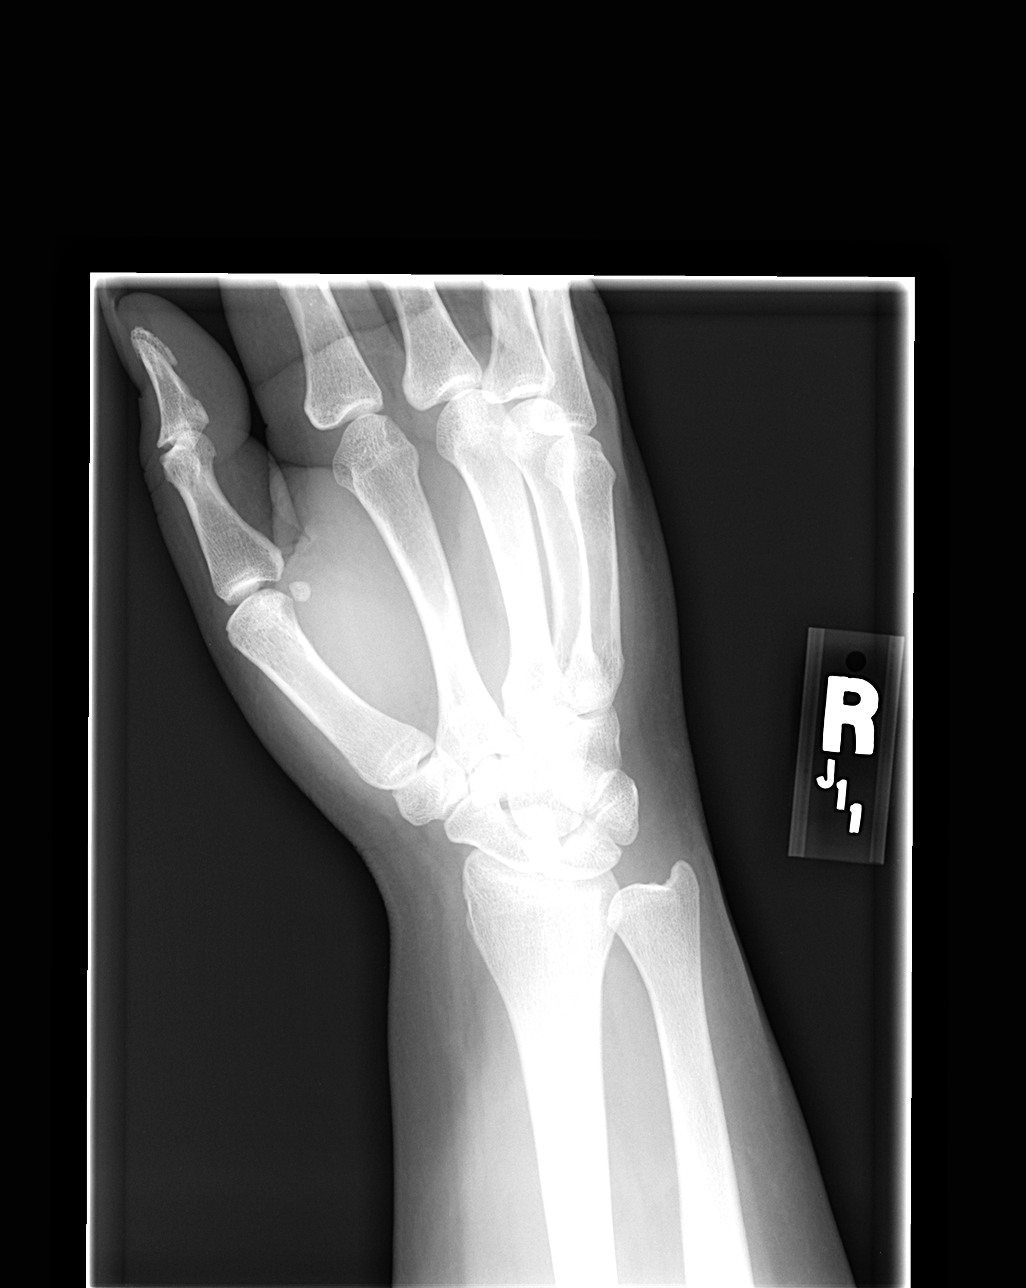

[view not recorded (3 of 4)]
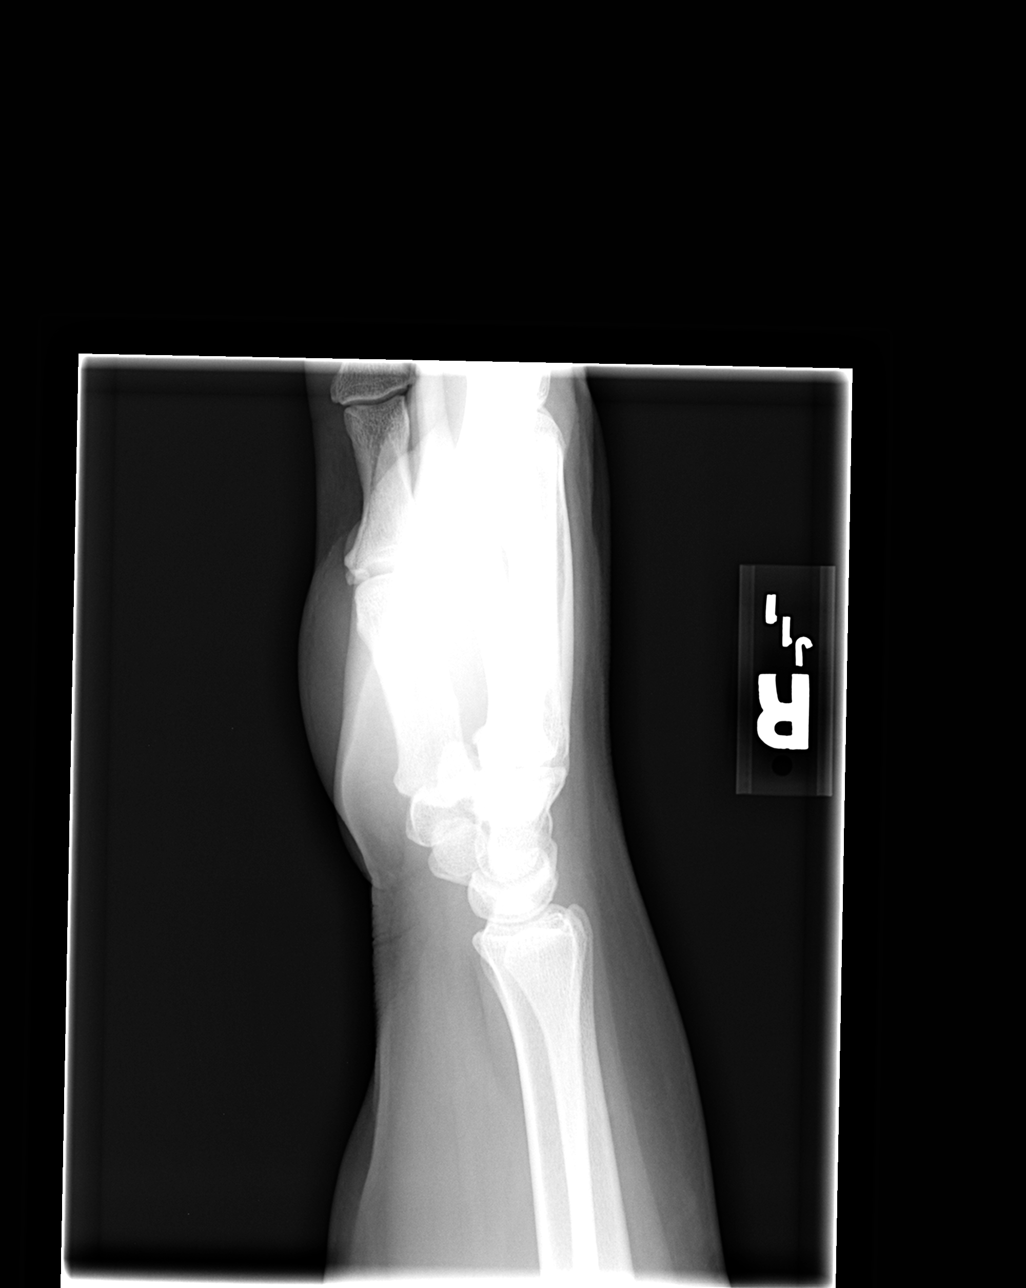

[view not recorded (4 of 4)]
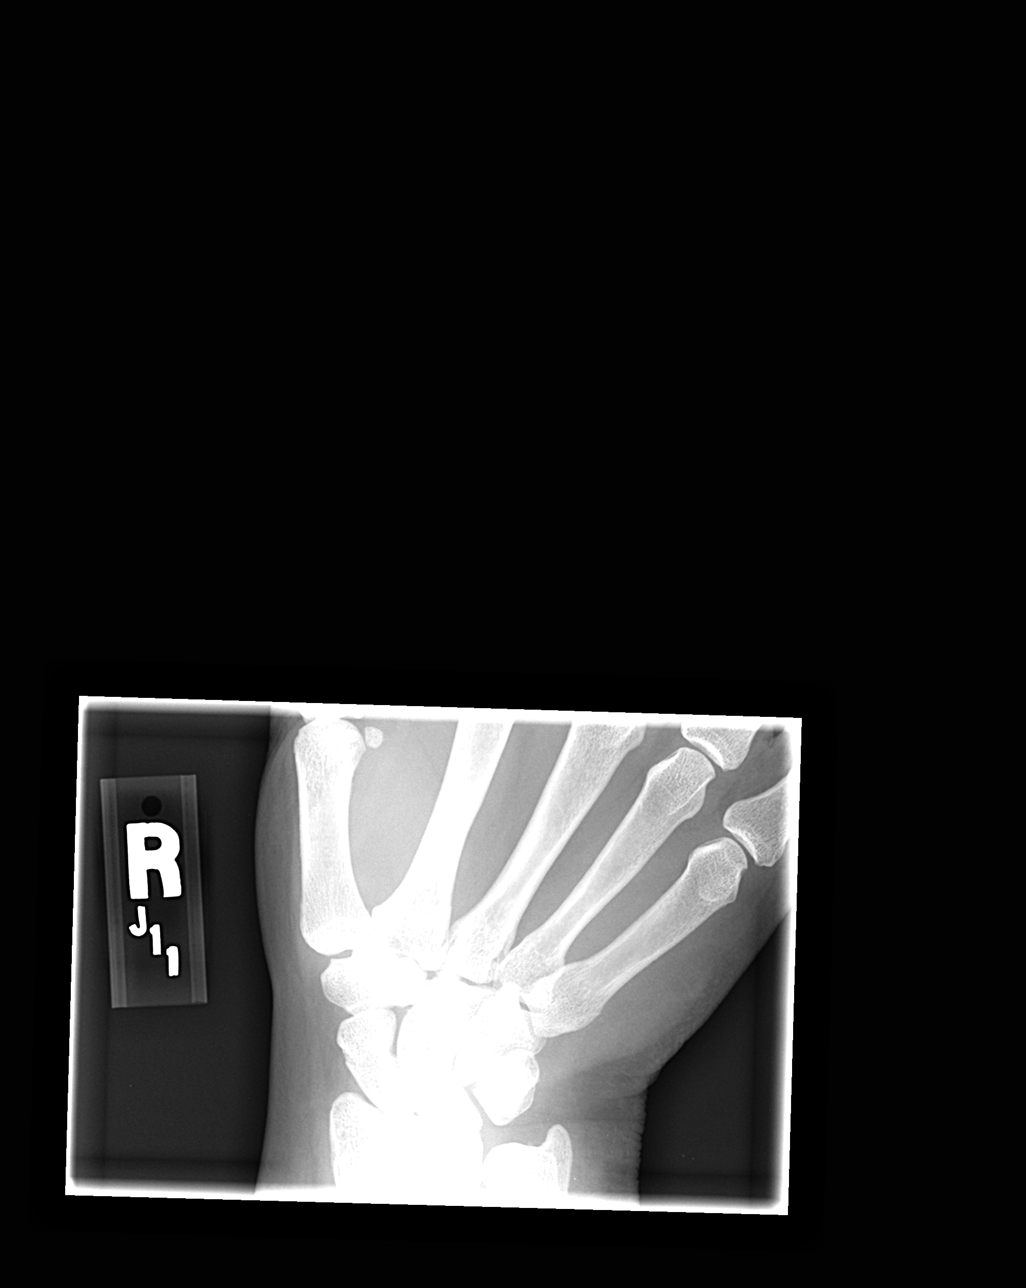

[4 of 4 positions shown; findings below may reference images not displayed]

FINDINGS: No bony abnormality.
IMPRESSION: No abnormality.

## 2009-02-05 ENCOUNTER — Emergency Department (HOSPITAL_COMMUNITY): Admission: EM | Admit: 2009-02-05 | Discharge: 2009-02-05 | Payer: Self-pay | Admitting: Emergency Medicine

## 2009-03-20 ENCOUNTER — Inpatient Hospital Stay (HOSPITAL_COMMUNITY): Admission: EM | Admit: 2009-03-20 | Discharge: 2009-03-22 | Payer: Self-pay | Admitting: Emergency Medicine

## 2009-03-21 ENCOUNTER — Encounter (INDEPENDENT_AMBULATORY_CARE_PROVIDER_SITE_OTHER): Payer: Self-pay | Admitting: Internal Medicine

## 2009-05-12 IMAGING — CT CT HEAD W/O CM
1 of 2 series · 13 of 30 positions shown, 17 images · IV contrast (agent unspecified)
Comparison: None available.

CLINICAL DATA: 50-year-old with ear and head pain.  Getting worse.  
 HEAD CT WITHOUT CONTRAST:
TECHNIQUE: Contiguous axial images were obtained from the base of the skull through the vertex according to standard protocol without contrast.

[Series 2: brain · axial · 0.47mm/px · z∈[+127,+250]mm · 13 of 28 slices shown, 17 images]
[im 2/28  brain]
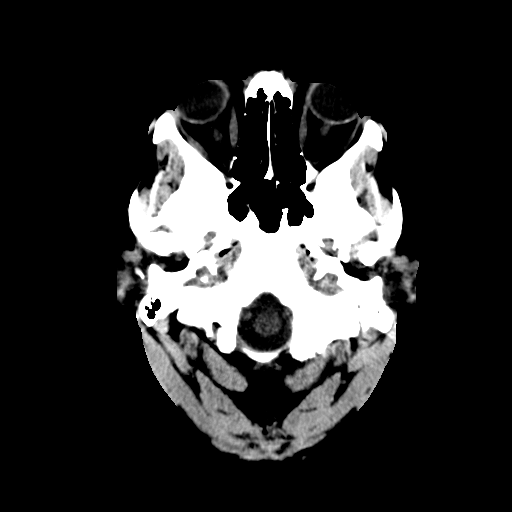
[im 2/28  bone]
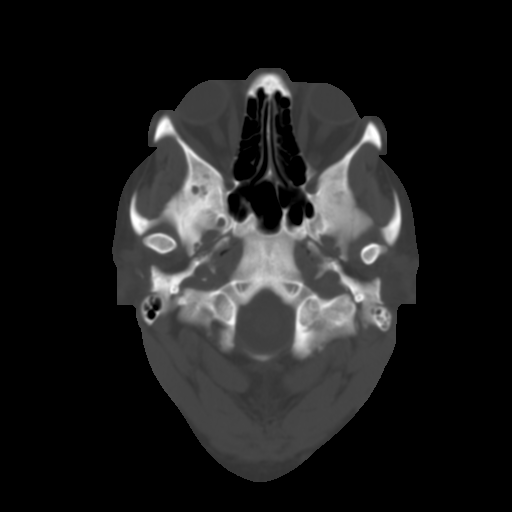
[im 4/28  brain]
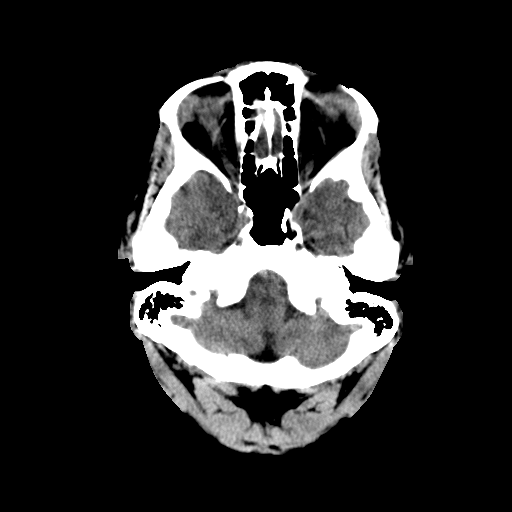
[im 6/28  brain]
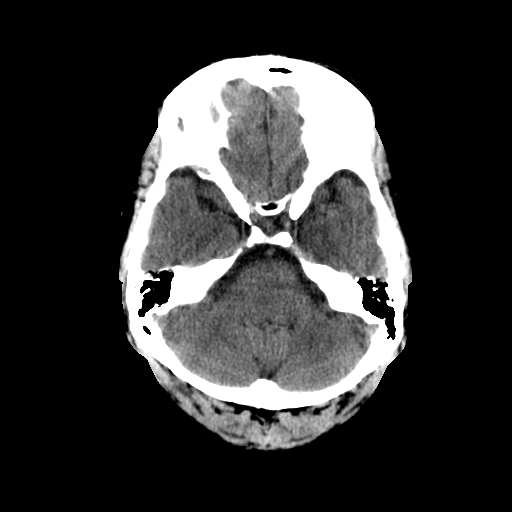
[im 8/28  brain]
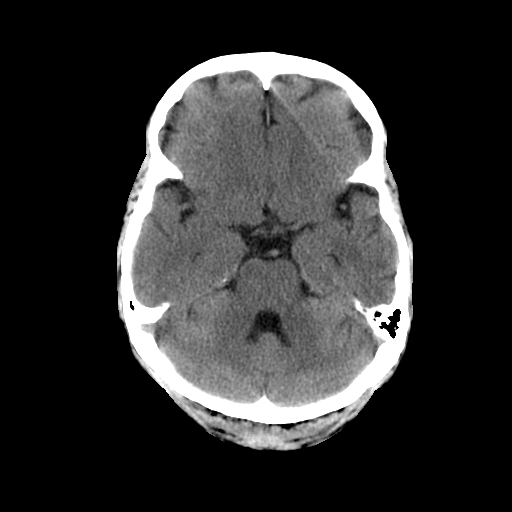
[im 10/28  brain]
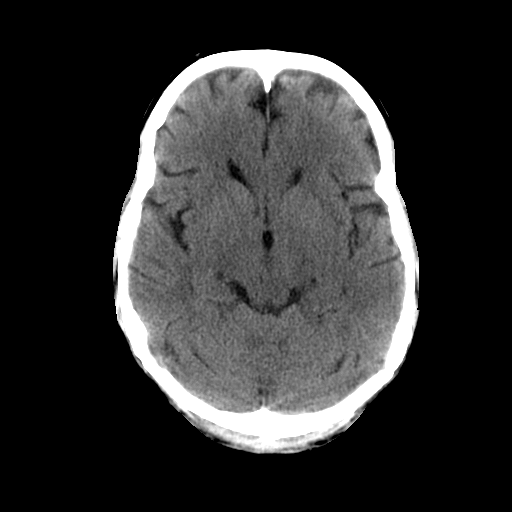
[im 10/28  bone]
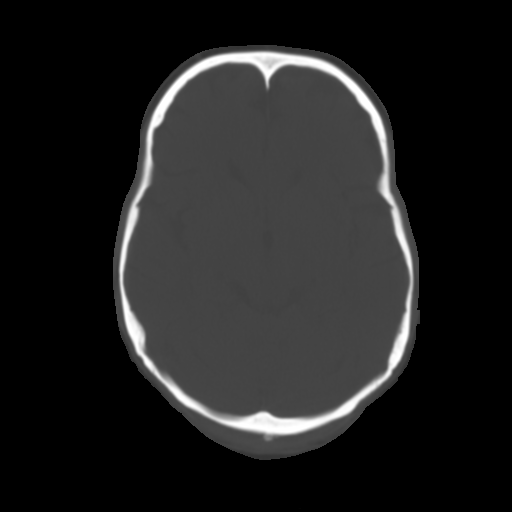
[im 12/28  brain]
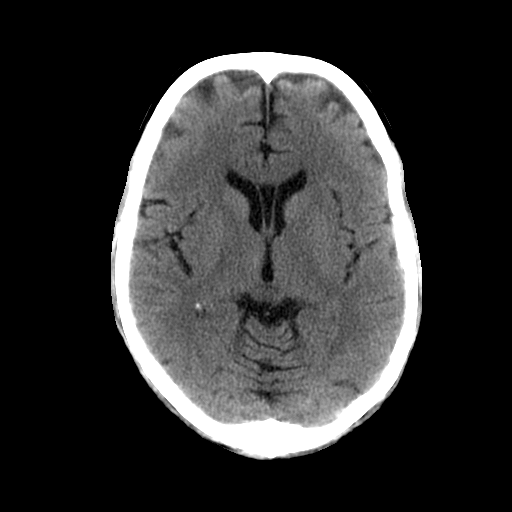
[im 14/28  brain]
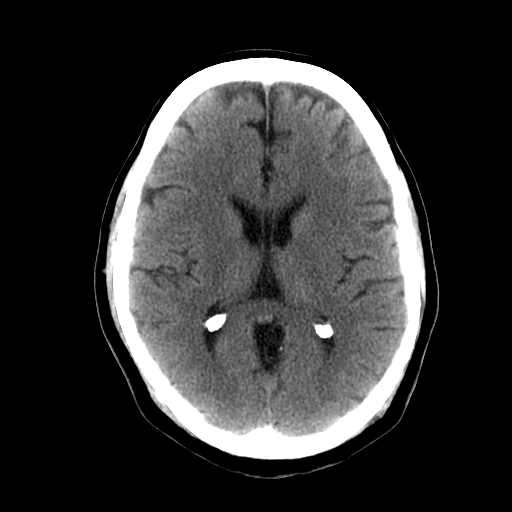
[im 16/28  brain]
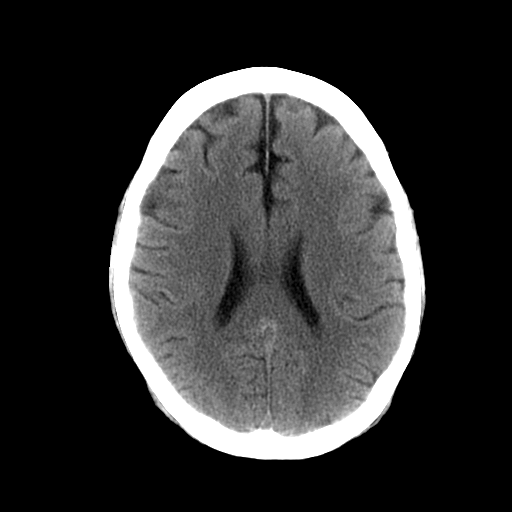
[im 18/28  brain]
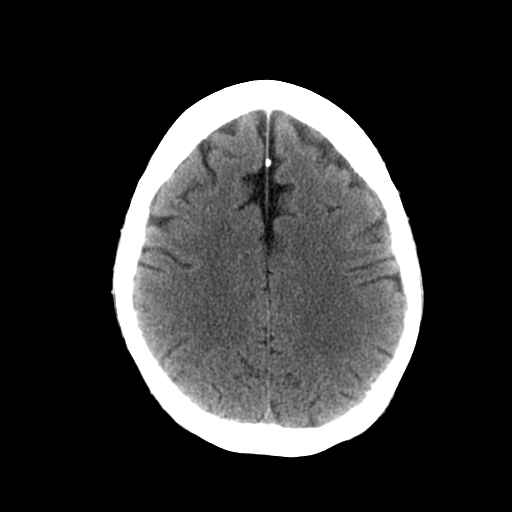
[im 18/28  bone]
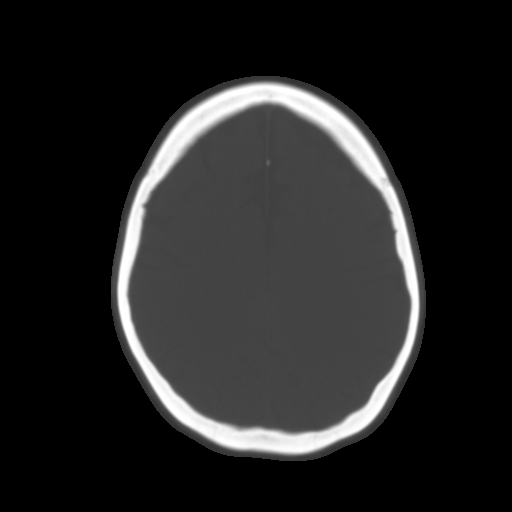
[im 20/28  brain]
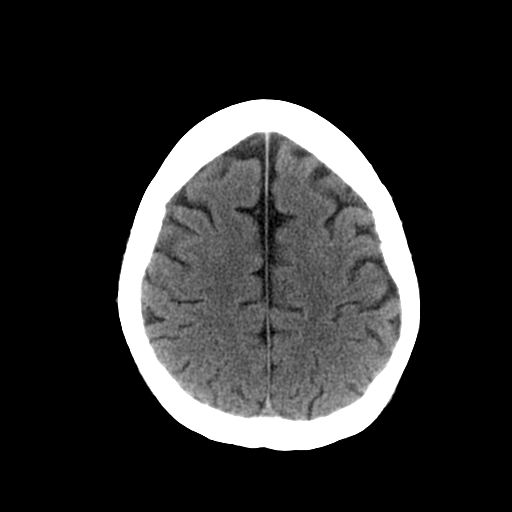
[im 22/28  brain]
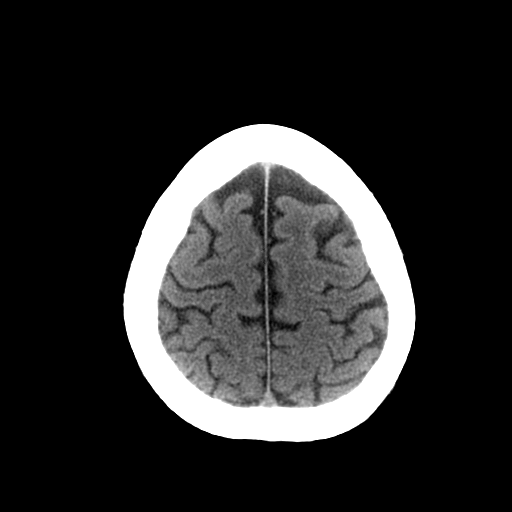
[im 24/28  brain]
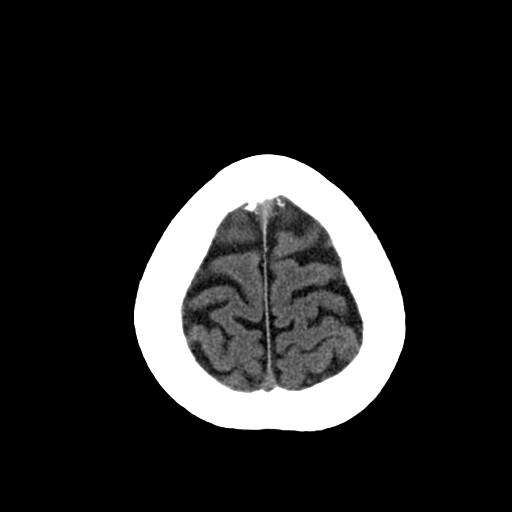
[im 26/28  brain]
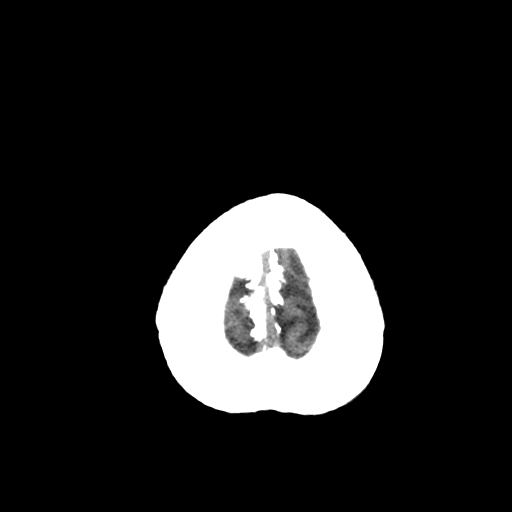
[im 26/28  bone]
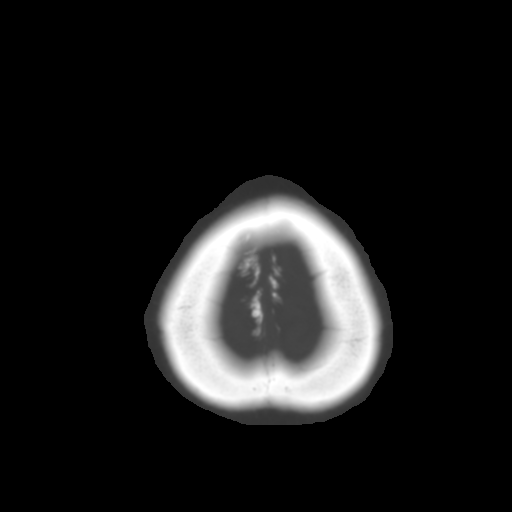

[13 of 30 positions shown; findings below may reference images not displayed]

FINDINGS: There is no evidence of intracranial hemorrhage, brain edema, acute infarct, mass lesion, or mass effect.  No other intra-axial abnormalities are seen, and the ventricles are within normal limits.  No abnormal extra-axial fluid collections or masses are identified.  No skull abnormalities are noted.
IMPRESSION: Negative non-contrast head CT.

## 2009-05-27 ENCOUNTER — Emergency Department (HOSPITAL_COMMUNITY): Admission: EM | Admit: 2009-05-27 | Discharge: 2009-05-27 | Payer: Self-pay | Admitting: Emergency Medicine

## 2009-06-09 IMAGING — CR DG CHEST 2V
2 series · 2 of 2 positions shown · non-contrast
Comparison: 03/23/06.

CLINICAL DATA: 50-year-old with shortness of breath, chest pain under breast, weakness for 2 days.  History of diabetes and bronchitis.  Hypertension.
 CHEST ? 2 VIEW:

[w chest pa]
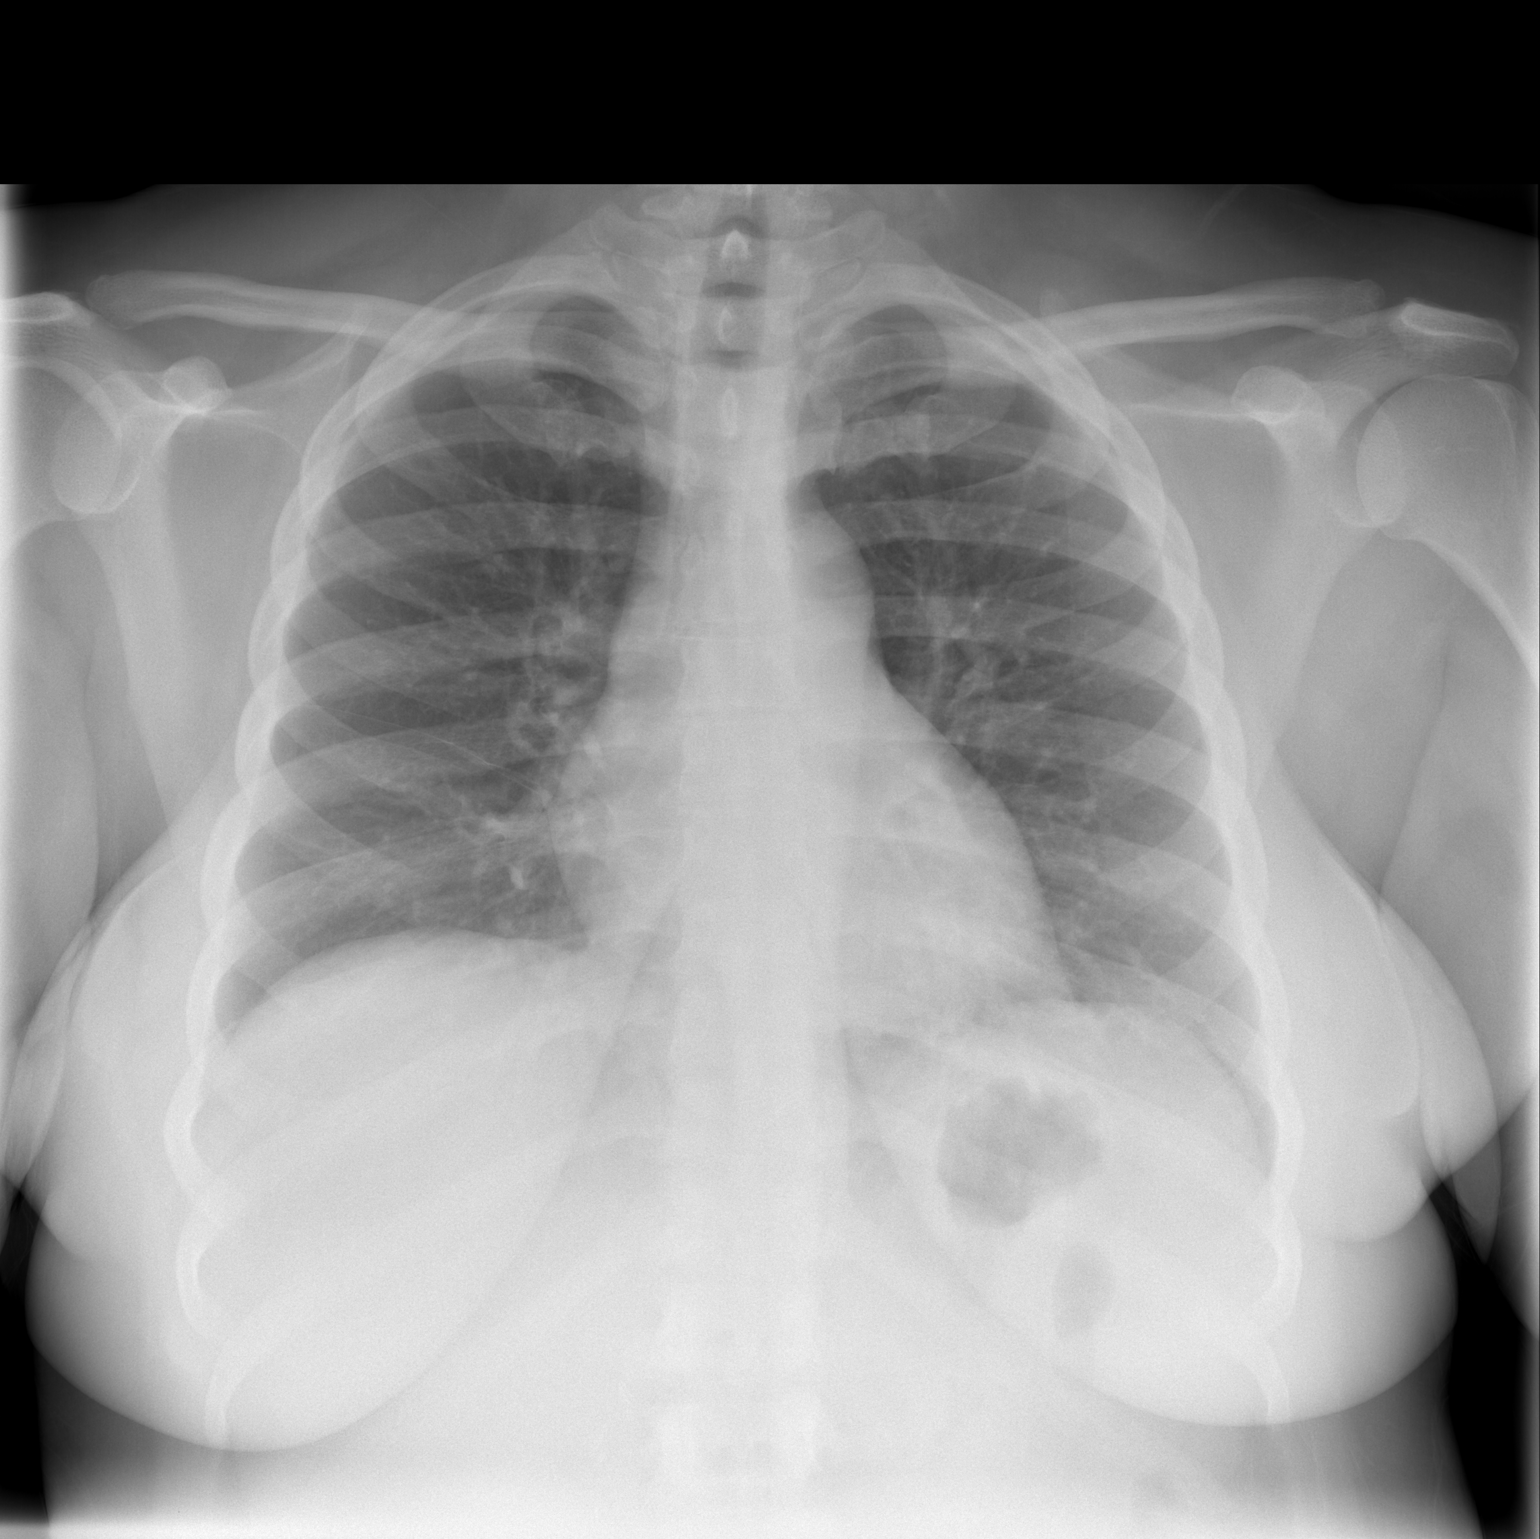

[w chest lat]
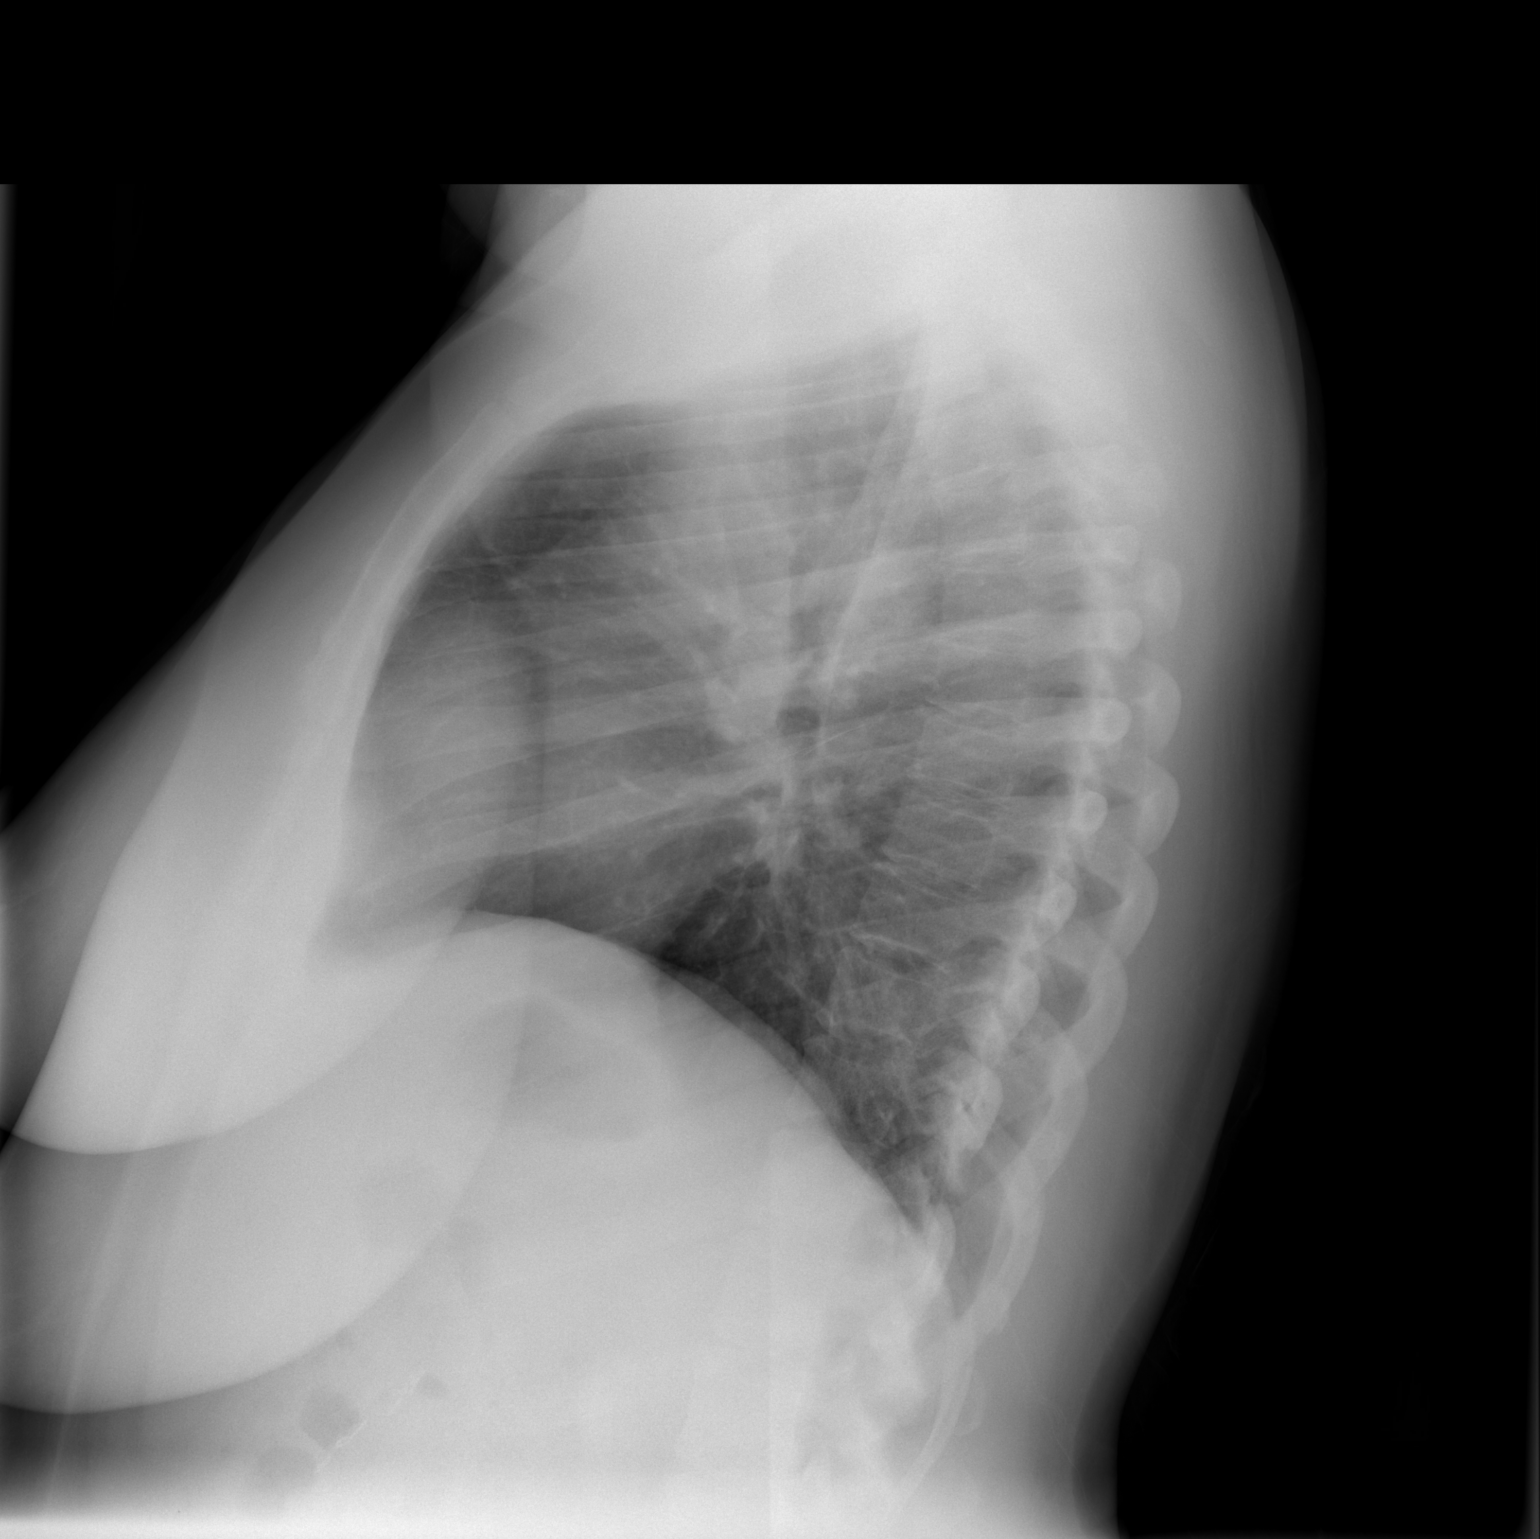

[2 of 2 positions shown; findings below may reference images not displayed]

FINDINGS: Heart size is normal.  Lungs are free of focal consolidations and pleural effusions.  There is minimal left lower lobe atelectasis.  Surgical clips are seen in the right upper quadrant.
IMPRESSION: Minimal left lower lobe atelectasis without other evidence for acute pulmonary abnormality.

## 2009-07-13 DIAGNOSIS — I5042 Chronic combined systolic (congestive) and diastolic (congestive) heart failure: Secondary | ICD-10-CM

## 2009-07-13 HISTORY — DX: Chronic combined systolic (congestive) and diastolic (congestive) heart failure: I50.42

## 2009-07-20 ENCOUNTER — Emergency Department (HOSPITAL_COMMUNITY): Admission: EM | Admit: 2009-07-20 | Discharge: 2009-07-20 | Payer: Self-pay | Admitting: Emergency Medicine

## 2009-08-16 ENCOUNTER — Emergency Department (HOSPITAL_COMMUNITY): Admission: EM | Admit: 2009-08-16 | Discharge: 2009-08-16 | Payer: Self-pay | Admitting: Emergency Medicine

## 2010-02-06 ENCOUNTER — Emergency Department (HOSPITAL_COMMUNITY): Admission: EM | Admit: 2010-02-06 | Discharge: 2010-02-07 | Payer: Self-pay | Admitting: Emergency Medicine

## 2010-03-05 IMAGING — CR DG CHEST 1V PORT
1 series · 1 of 1 positions shown · non-contrast
Comparison: Two-view chest x-ray 02/20/2007 and 03/23/2006.  CT
angio chest 03/23/2006.

CLINICAL DATA: Shortness of breath.  Headache.  Chest pain.
History of bronchitis, hypertension, and diabetes.

PORTABLE CHEST - 1 VIEW [DATE]/[id number] hours:

[view not recorded]
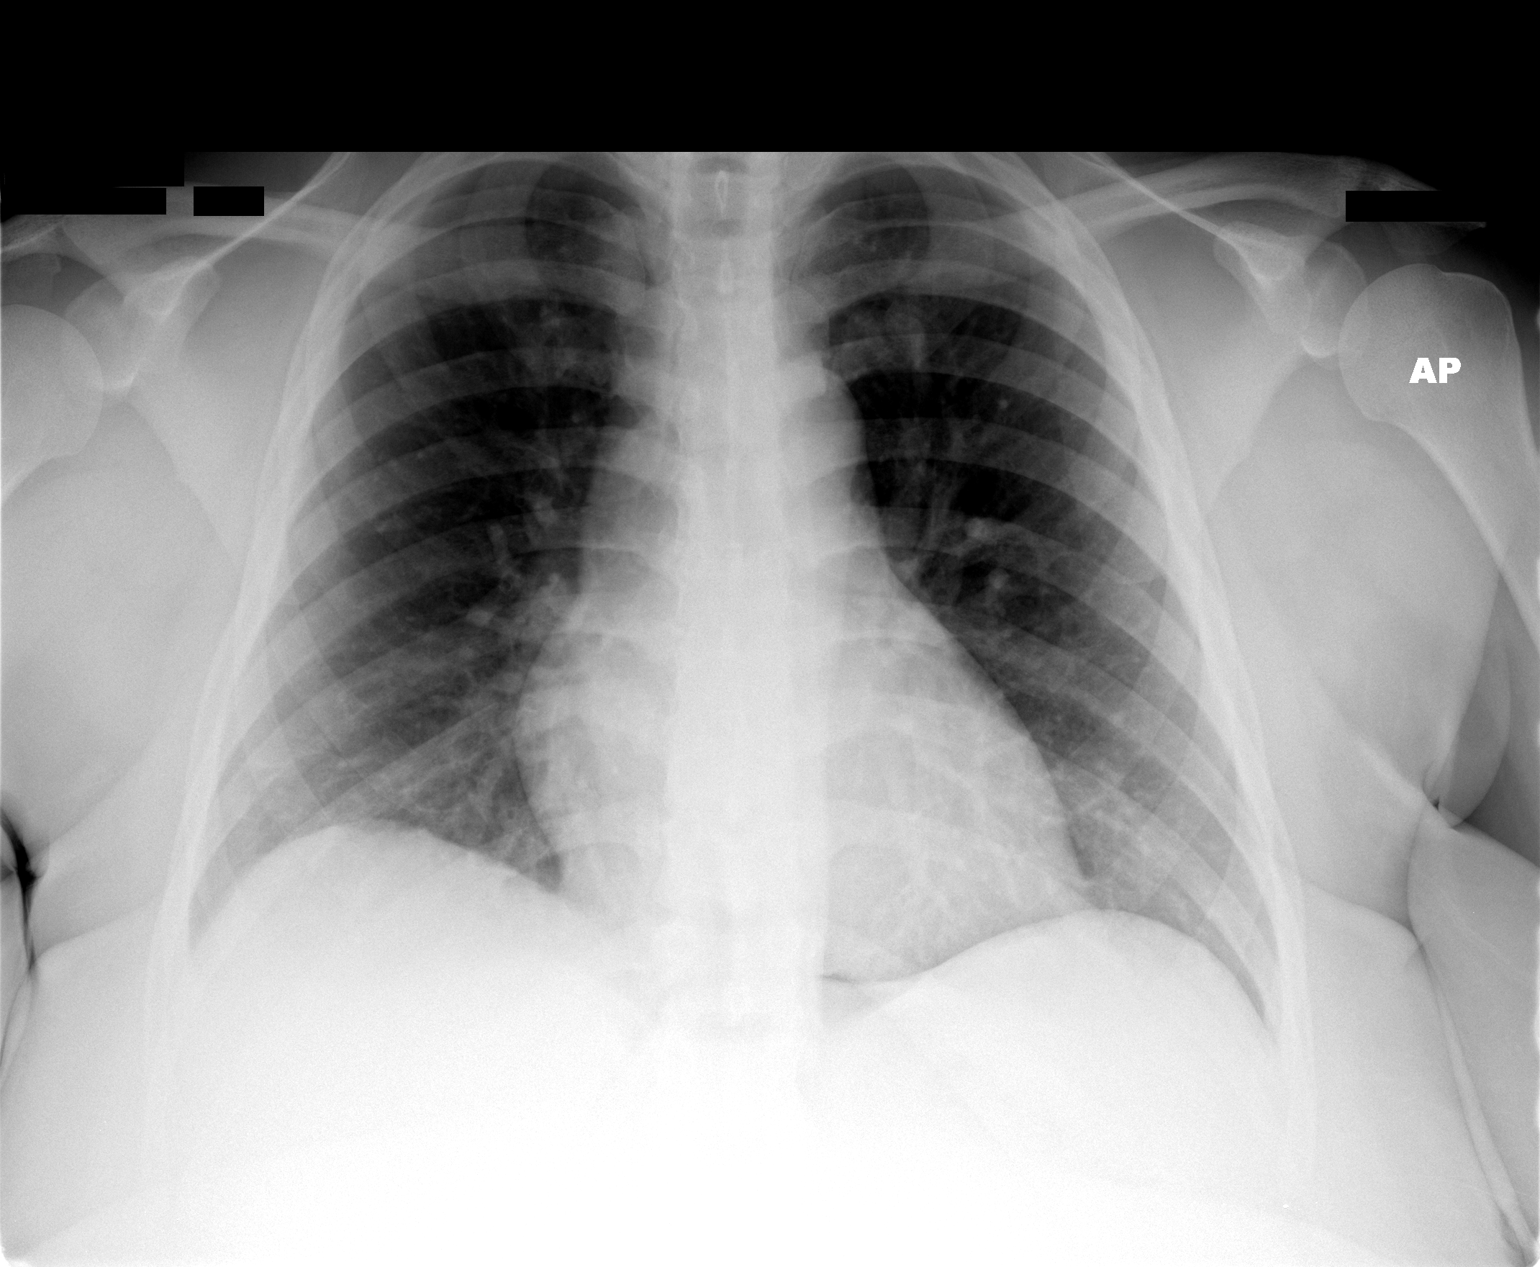

[1 of 1 positions shown; findings below may reference images not displayed]

FINDINGS: Cardiomediastinal silhouette unremarkable for the AP
portable technique.  Pulmonary parenchyma clear.  Pulmonary
vascularity normal.  No pleural effusions.
IMPRESSION: No acute cardiopulmonary disease.

## 2010-03-10 ENCOUNTER — Emergency Department (HOSPITAL_COMMUNITY): Admission: EM | Admit: 2010-03-10 | Discharge: 2010-03-10 | Payer: Self-pay | Admitting: Emergency Medicine

## 2010-04-10 ENCOUNTER — Emergency Department (HOSPITAL_COMMUNITY): Admission: EM | Admit: 2010-04-10 | Discharge: 2010-04-11 | Payer: Self-pay | Admitting: Emergency Medicine

## 2010-05-29 ENCOUNTER — Emergency Department (HOSPITAL_COMMUNITY): Admission: EM | Admit: 2010-05-29 | Discharge: 2010-05-29 | Payer: Self-pay | Admitting: Emergency Medicine

## 2010-07-07 ENCOUNTER — Emergency Department (HOSPITAL_COMMUNITY)
Admission: EM | Admit: 2010-07-07 | Discharge: 2010-07-07 | Payer: Self-pay | Source: Home / Self Care | Admitting: Emergency Medicine

## 2010-07-24 IMAGING — CR DG CHEST 1V PORT
1 series · 1 of 1 positions shown · non-contrast
Comparison: 11/16/2007

CLINICAL DATA: Shortness of breath and chest pain

PORTABLE CHEST - 1 VIEW

[view not recorded]
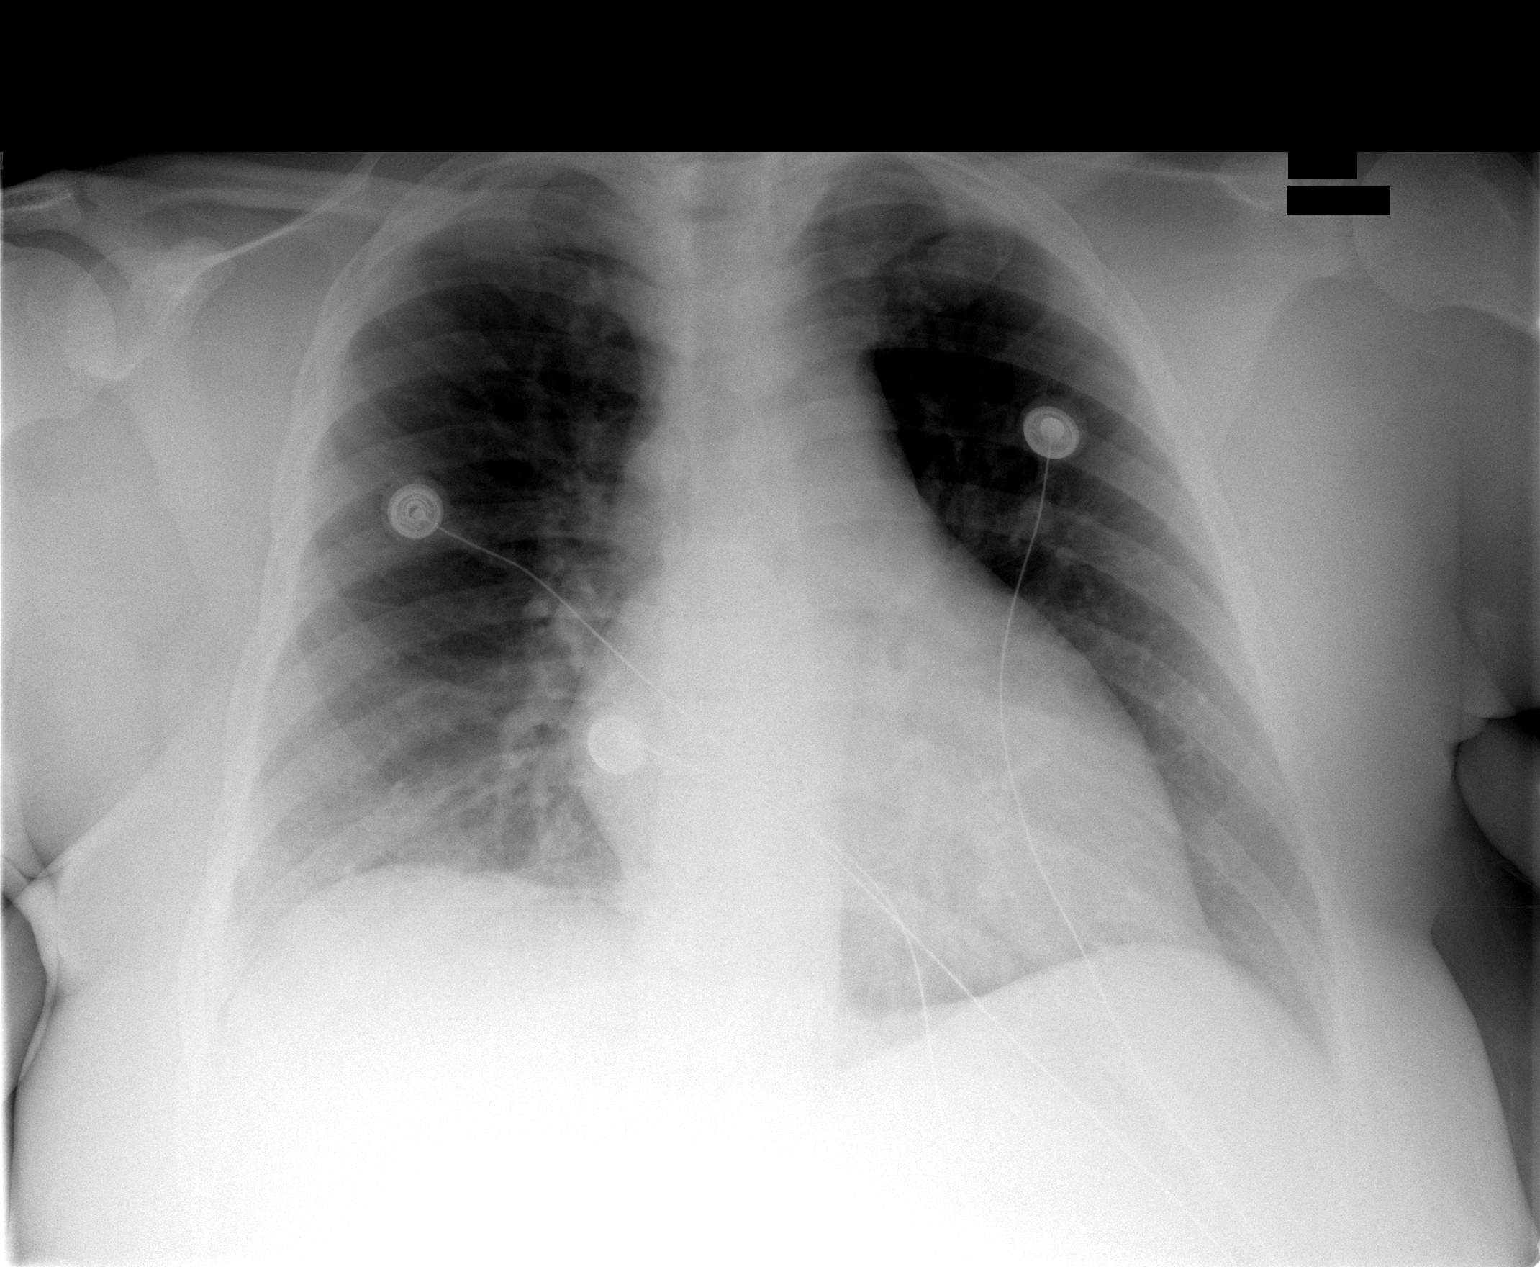

[1 of 1 positions shown; findings below may reference images not displayed]

FINDINGS: Heart size and mediastinal contours are unremarkable.

There is no pleural effusion or pulmonary interstitial edema

No airspace densities identified
IMPRESSION: 1.  No active cardiopulmonary disease

## 2010-07-26 IMAGING — CT CT ANGIO CHEST
1 of 6 series · 19 of 36 positions shown · IV contrast (APPLIED)
Comparison: 03/23/2006.

CLINICAL DATA: 52-year-old female with shortness of breath and
chest tightness.

CT ANGIOGRAPHY CHEST
TECHNIQUE: Multidetector CT imaging of the chest using the
standard protocol during bolus administration of intravenous
contrast. Multiplanar reconstructed images obtained and reviewed to
evaluate the vascular anatomy.
Contrast: 80 ml [id number]-ZDD.

[Series 8: thins for terarecon · axial · 0.68mm/px · z∈[+1138,+1374]mm · 19 of 264 slices shown]
[im 14/264  lung]
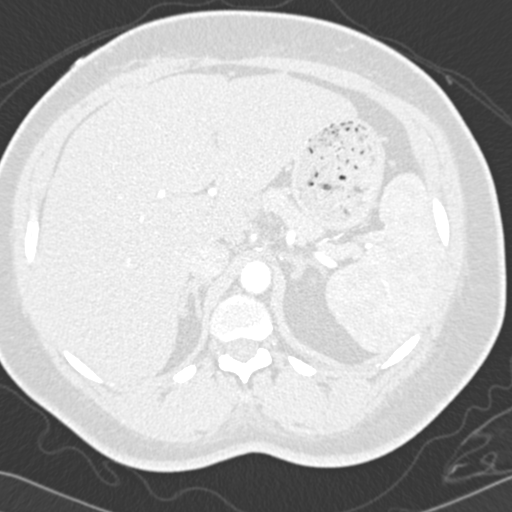
[im 27/264  mediastinal]
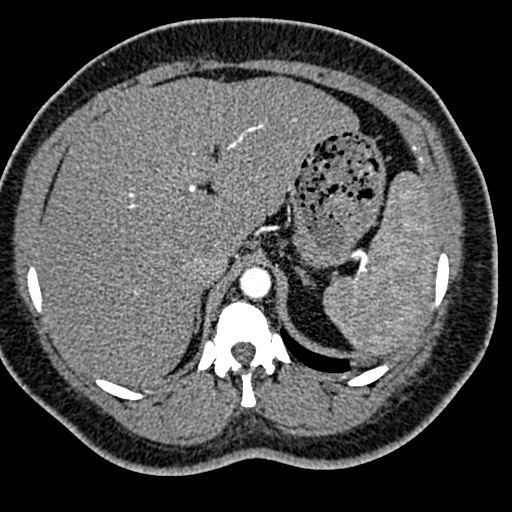
[im 40/264  lung]
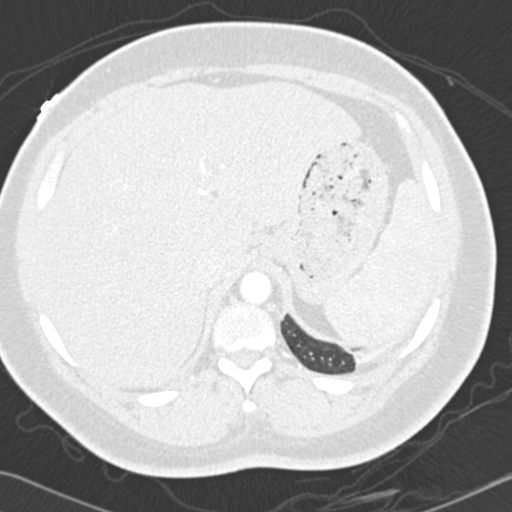
[im 53/264  mediastinal]
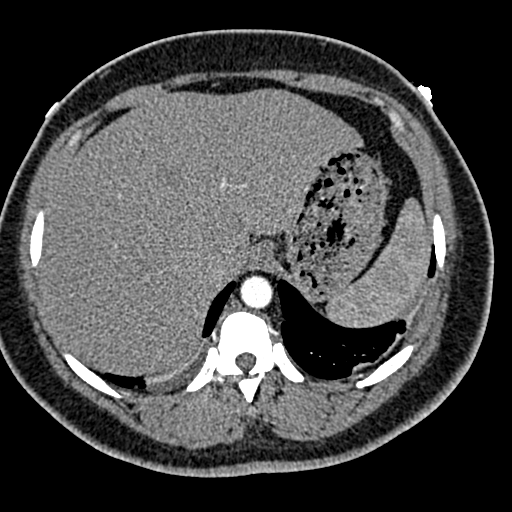
[im 66/264  lung]
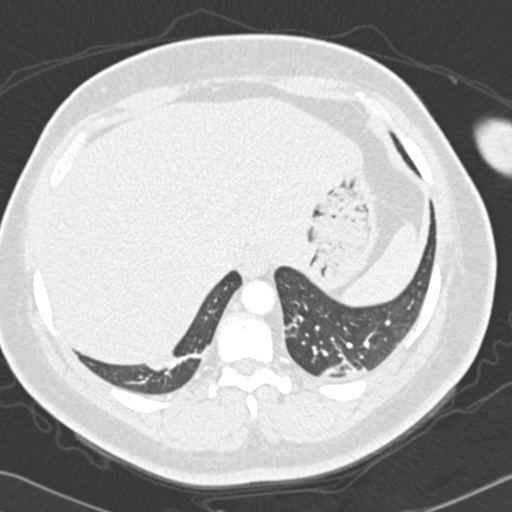
[im 79/264  mediastinal]
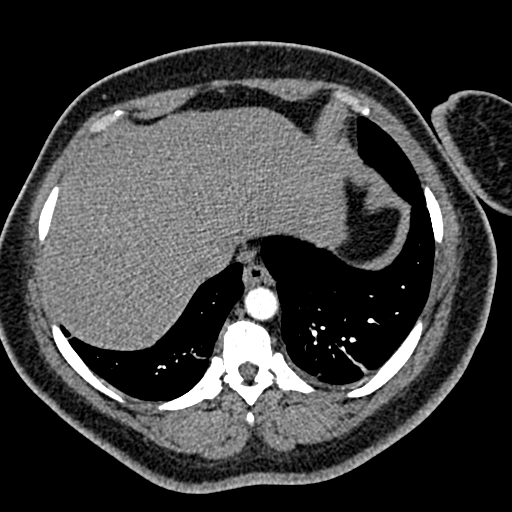
[im 93/264  lung]
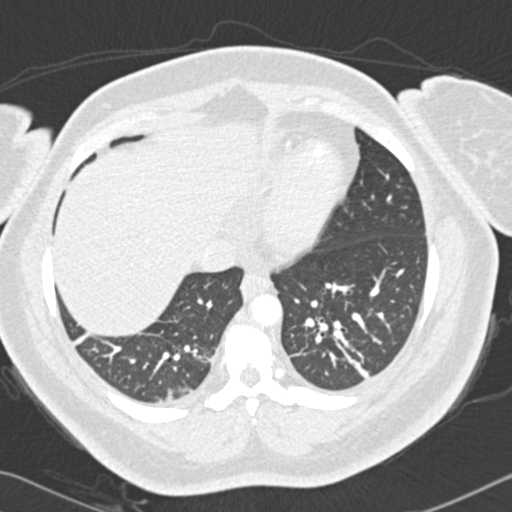
[im 106/264  mediastinal]
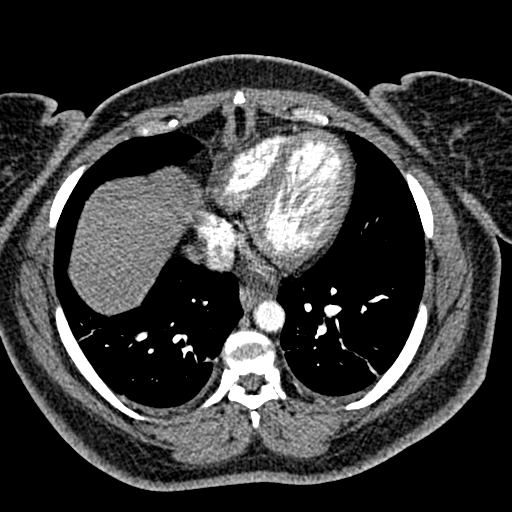
[im 119/264  lung]
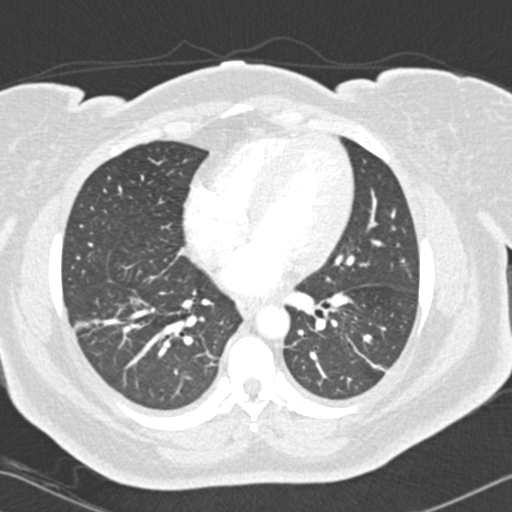
[im 132/264  mediastinal]
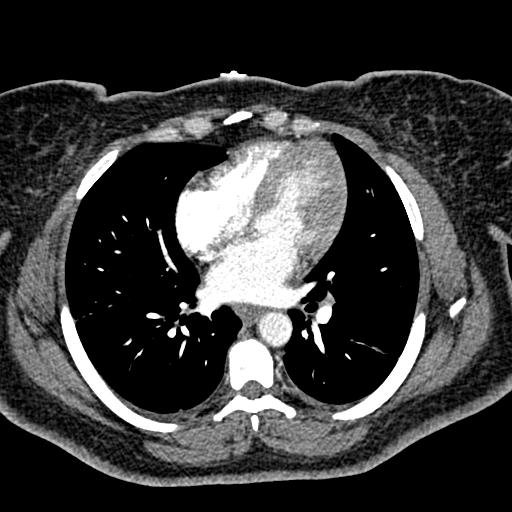
[im 145/264  lung]
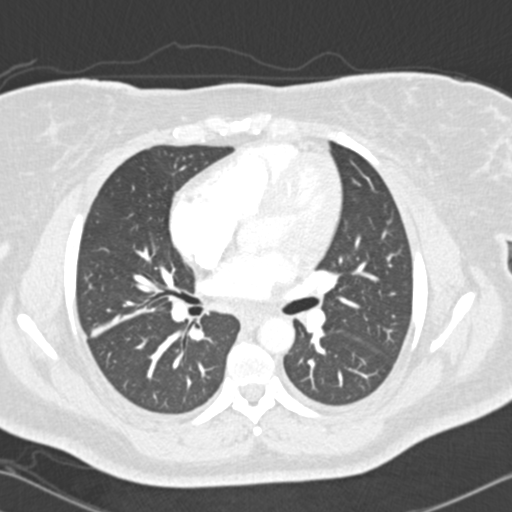
[im 158/264  mediastinal]
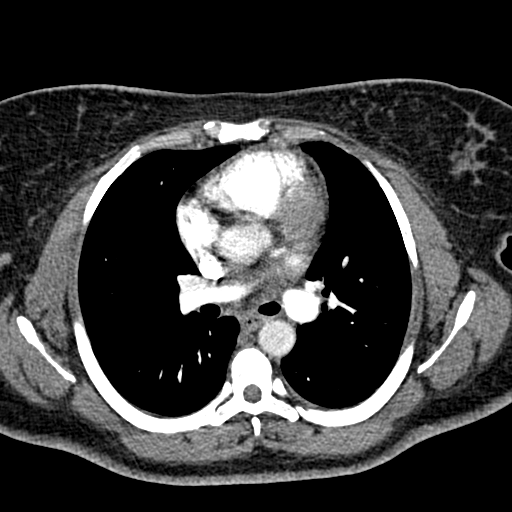
[im 171/264  lung]
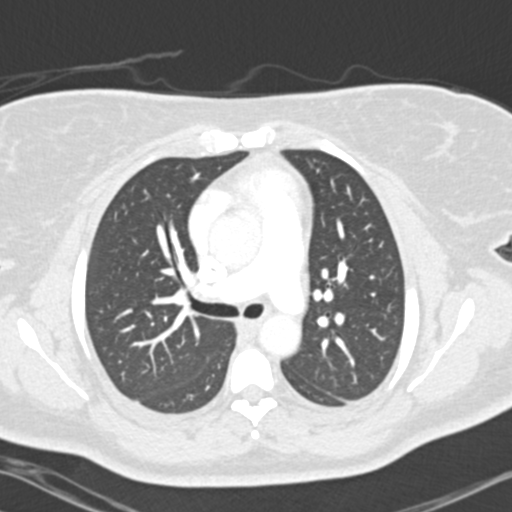
[im 185/264  mediastinal]
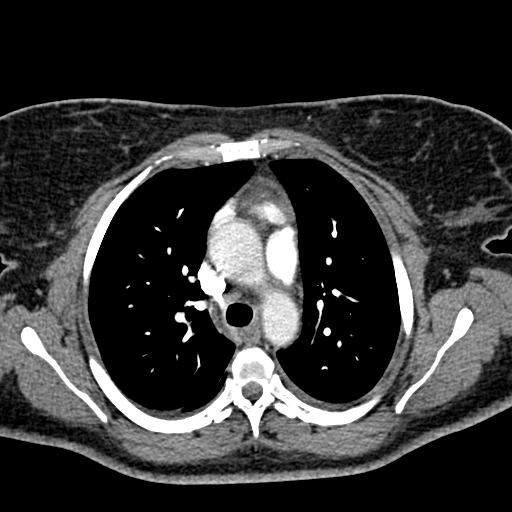
[im 198/264  lung]
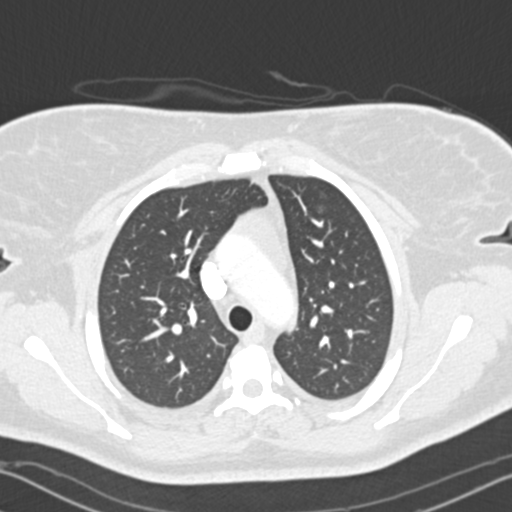
[im 211/264  mediastinal]
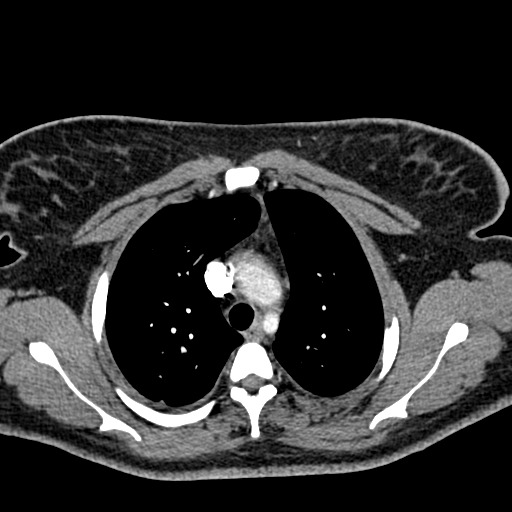
[im 224/264  lung]
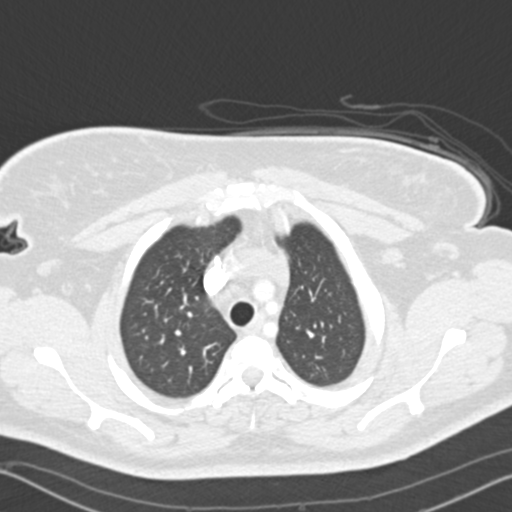
[im 237/264  mediastinal]
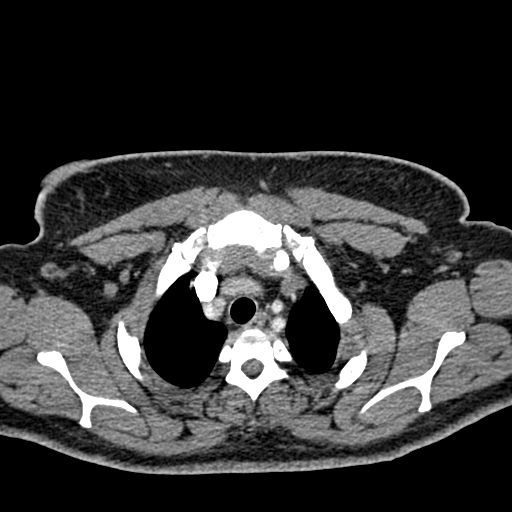
[im 250/264  lung]
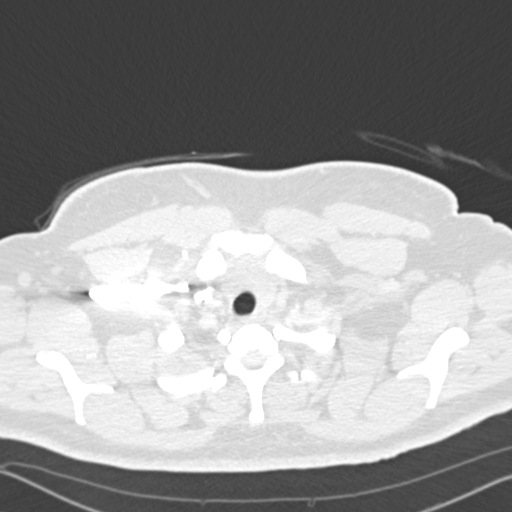

[19 of 36 positions shown; findings below may reference images not displayed]

FINDINGS: Good contrast bolus timing in the pulmonary arterial
tree.  No focal filling defect identified in the pulmonary arterial
tree to suggest the presence of acute pulmonary embolism.

Thoracic inlet is within normal limits.  Small volume of
pericardial fluid suspected in the superior pericardial recess,
probably physiologic and similar to the prior exam.  Otherwise, no
pericardial or pleural effusion.  Visualized upper abdominal
viscera are within normal limits.  Visualized aorta is within
normal limits.  Stable axillary and sub centimeter mediastinal
lymph nodes.

Major airways are patent.  Linear atelectasis is present
bilaterally.  Interval improved ventilation since the prior exam.
No airspace consolidation.  Stable 5.5 mm pulmonary nodule the
right lateral upper lobe (series 6 image 20).

No acute osseous abnormality identified.
IMPRESSION: 1. No evidence of acute pulmonary embolus.
2.  Bilateral pulmonary atelectasis.
3.  Benign 5.5 mm pulmonary nodule in the right upper lobe,
demonstrating stability and 03/23/2006.

## 2010-08-02 ENCOUNTER — Encounter: Payer: Self-pay | Admitting: Internal Medicine

## 2010-08-03 ENCOUNTER — Encounter: Payer: Self-pay | Admitting: Internal Medicine

## 2010-08-03 ENCOUNTER — Encounter: Payer: Self-pay | Admitting: Obstetrics and Gynecology

## 2010-09-02 ENCOUNTER — Emergency Department (HOSPITAL_COMMUNITY)
Admission: EM | Admit: 2010-09-02 | Discharge: 2010-09-02 | Disposition: A | Discharge status: Home / Self Care | Payer: Self-pay | Attending: Emergency Medicine | Admitting: Emergency Medicine

## 2010-09-02 ENCOUNTER — Emergency Department (HOSPITAL_COMMUNITY): Payer: Self-pay

## 2010-09-02 DIAGNOSIS — R059 Cough, unspecified: Secondary | ICD-10-CM | POA: Insufficient documentation

## 2010-09-02 DIAGNOSIS — R220 Localized swelling, mass and lump, head: Secondary | ICD-10-CM | POA: Insufficient documentation

## 2010-09-02 DIAGNOSIS — R6883 Chills (without fever): Secondary | ICD-10-CM | POA: Insufficient documentation

## 2010-09-02 DIAGNOSIS — F329 Major depressive disorder, single episode, unspecified: Secondary | ICD-10-CM | POA: Insufficient documentation

## 2010-09-02 DIAGNOSIS — IMO0001 Reserved for inherently not codable concepts without codable children: Secondary | ICD-10-CM | POA: Insufficient documentation

## 2010-09-02 DIAGNOSIS — R070 Pain in throat: Secondary | ICD-10-CM | POA: Insufficient documentation

## 2010-09-02 DIAGNOSIS — J3489 Other specified disorders of nose and nasal sinuses: Secondary | ICD-10-CM | POA: Insufficient documentation

## 2010-09-02 DIAGNOSIS — R05 Cough: Secondary | ICD-10-CM | POA: Insufficient documentation

## 2010-09-02 DIAGNOSIS — E119 Type 2 diabetes mellitus without complications: Secondary | ICD-10-CM | POA: Insufficient documentation

## 2010-09-02 DIAGNOSIS — R509 Fever, unspecified: Secondary | ICD-10-CM | POA: Insufficient documentation

## 2010-09-02 DIAGNOSIS — F3289 Other specified depressive episodes: Secondary | ICD-10-CM | POA: Insufficient documentation

## 2010-09-02 DIAGNOSIS — Z79899 Other long term (current) drug therapy: Secondary | ICD-10-CM | POA: Insufficient documentation

## 2010-09-02 DIAGNOSIS — J329 Chronic sinusitis, unspecified: Secondary | ICD-10-CM | POA: Insufficient documentation

## 2010-09-02 DIAGNOSIS — R51 Headache: Secondary | ICD-10-CM | POA: Insufficient documentation

## 2010-09-02 DIAGNOSIS — H9209 Otalgia, unspecified ear: Secondary | ICD-10-CM | POA: Insufficient documentation

## 2010-09-02 DIAGNOSIS — I10 Essential (primary) hypertension: Secondary | ICD-10-CM | POA: Insufficient documentation

## 2010-09-23 LAB — CBC
HCT: 33 % — ABNORMAL LOW (ref 36.0–46.0)
Hemoglobin: 11 g/dL — ABNORMAL LOW (ref 12.0–15.0)
MCH: 28.6 pg (ref 26.0–34.0)
MCHC: 33.5 g/dL (ref 30.0–36.0)
MCV: 85.4 fL (ref 78.0–100.0)
Platelets: 249 10*3/uL (ref 150–400)
RBC: 3.86 MIL/uL — ABNORMAL LOW (ref 3.87–5.11)
RDW: 15.2 % (ref 11.5–15.5)
WBC: 6.7 10*3/uL (ref 4.0–10.5)

## 2010-09-23 LAB — COMPREHENSIVE METABOLIC PANEL
ALT: 21 U/L (ref 0–35)
AST: 25 U/L (ref 0–37)
Albumin: 3.3 g/dL — ABNORMAL LOW (ref 3.5–5.2)
Alkaline Phosphatase: 98 U/L (ref 39–117)
BUN: 23 mg/dL (ref 6–23)
CO2: 28 mEq/L (ref 19–32)
Calcium: 9.2 mg/dL (ref 8.4–10.5)
Chloride: 101 mEq/L (ref 96–112)
Creatinine, Ser: 0.95 mg/dL (ref 0.4–1.2)
GFR calc Af Amer: >60 mL/min (ref >60)
GFR calc non Af Amer: >60 mL/min (ref >60)
Glucose, Bld: 248 mg/dL — ABNORMAL HIGH (ref 70–99)
Potassium: 3.7 mEq/L (ref 3.5–5.1)
Sodium: 140 mEq/L (ref 135–145)
Total Bilirubin: 0.4 mg/dL (ref 0.3–1.2)
Total Protein: 7.6 g/dL (ref 6.0–8.3)

## 2010-09-23 LAB — DIFFERENTIAL
Basophils Absolute: 0 10*3/uL (ref 0.0–0.1)
Basophils Relative: 1 % (ref 0–1)
Eosinophils Absolute: 0.3 10*3/uL (ref 0.0–0.7)
Eosinophils Relative: 5 % (ref 0–5)
Lymphocytes Relative: 39 % (ref 12–46)
Lymphs Abs: 2.6 10*3/uL (ref 0.7–4.0)
Monocytes Absolute: 0.4 10*3/uL (ref 0.1–1.0)
Monocytes Relative: 6 % (ref 3–12)
Neutro Abs: 3.3 10*3/uL (ref 1.7–7.7)
Neutrophils Relative %: 49 % (ref 43–77)

## 2010-09-23 LAB — URINALYSIS, ROUTINE W REFLEX MICROSCOPIC
Bilirubin Urine: NEGATIVE
Glucose, UA: NEGATIVE mg/dL
Hgb urine dipstick: NEGATIVE
Ketones, ur: NEGATIVE mg/dL
Leukocytes, UA: NEGATIVE
Nitrite: NEGATIVE
Protein, ur: 30 mg/dL — AB
Specific Gravity, Urine: 1.013 (ref 1.005–1.030)
Urobilinogen, UA: 0.2 mg/dL (ref 0.0–1.0)
pH: 5.5 (ref 5.0–8.0)

## 2010-09-23 LAB — GLUCOSE, CAPILLARY: Glucose-Capillary: 237 mg/dL — ABNORMAL HIGH (ref 70–99)

## 2010-09-23 LAB — URINE MICROSCOPIC-ADD ON

## 2010-09-23 LAB — D-DIMER, QUANTITATIVE: D-Dimer, Quant: <0.22 ug/mL-FEU (ref 0.00–0.48)

## 2010-09-23 LAB — LIPASE, BLOOD: Lipase: 33 U/L (ref 11–59)

## 2010-09-25 LAB — URINALYSIS, ROUTINE W REFLEX MICROSCOPIC
Bilirubin Urine: NEGATIVE
Glucose, UA: NEGATIVE mg/dL
Hgb urine dipstick: NEGATIVE
Ketones, ur: NEGATIVE mg/dL
Nitrite: NEGATIVE
Protein, ur: NEGATIVE mg/dL
Specific Gravity, Urine: 1.016 (ref 1.005–1.030)
Urobilinogen, UA: 0.2 mg/dL (ref 0.0–1.0)
pH: 5 (ref 5.0–8.0)

## 2010-09-25 LAB — DIFFERENTIAL
Basophils Absolute: 0 10*3/uL (ref 0.0–0.1)
Basophils Relative: 1 % (ref 0–1)
Eosinophils Absolute: 0.3 10*3/uL (ref 0.0–0.7)
Eosinophils Relative: 4 % (ref 0–5)
Lymphocytes Relative: 41 % (ref 12–46)
Lymphs Abs: 3.1 10*3/uL (ref 0.7–4.0)
Monocytes Absolute: 0.6 10*3/uL (ref 0.1–1.0)
Monocytes Relative: 8 % (ref 3–12)
Neutro Abs: 3.4 10*3/uL (ref 1.7–7.7)
Neutrophils Relative %: 46 % (ref 43–77)

## 2010-09-25 LAB — CBC
HCT: 35.7 % — ABNORMAL LOW (ref 36.0–46.0)
Hemoglobin: 11.7 g/dL — ABNORMAL LOW (ref 12.0–15.0)
MCH: 28.5 pg (ref 26.0–34.0)
MCHC: 32.9 g/dL (ref 30.0–36.0)
MCV: 86.8 fL (ref 78.0–100.0)
Platelets: 280 10*3/uL (ref 150–400)
RBC: 4.11 MIL/uL (ref 3.87–5.11)
RDW: 15.1 % (ref 11.5–15.5)
WBC: 7.5 10*3/uL (ref 4.0–10.5)

## 2010-09-25 LAB — GLUCOSE, CAPILLARY: Glucose-Capillary: 132 mg/dL — ABNORMAL HIGH (ref 70–99)

## 2010-09-25 LAB — BASIC METABOLIC PANEL
BUN: 32 mg/dL — ABNORMAL HIGH (ref 6–23)
CO2: 26 mEq/L (ref 19–32)
Calcium: 8.9 mg/dL (ref 8.4–10.5)
Chloride: 108 mEq/L (ref 96–112)
Creatinine, Ser: 0.9 mg/dL (ref 0.4–1.2)
GFR calc Af Amer: >60 mL/min (ref >60)
GFR calc non Af Amer: >60 mL/min (ref >60)
Glucose, Bld: 71 mg/dL (ref 70–99)
Potassium: 3.7 mEq/L (ref 3.5–5.1)
Sodium: 139 mEq/L (ref 135–145)

## 2010-09-27 LAB — CBC
HCT: 37.2 % (ref 36.0–46.0)
Hemoglobin: 12.2 g/dL (ref 12.0–15.0)
MCH: 28.7 pg (ref 26.0–34.0)
MCHC: 32.9 g/dL (ref 30.0–36.0)
MCV: 87.2 fL (ref 78.0–100.0)
Platelets: 263 10*3/uL (ref 150–400)
RBC: 4.26 MIL/uL (ref 3.87–5.11)
RDW: 14.6 % (ref 11.5–15.5)
WBC: 9.1 10*3/uL (ref 4.0–10.5)

## 2010-09-27 LAB — URINE MICROSCOPIC-ADD ON

## 2010-09-27 LAB — DIFFERENTIAL
Basophils Absolute: 0 10*3/uL (ref 0.0–0.1)
Basophils Relative: 0 % (ref 0–1)
Eosinophils Absolute: 0.4 10*3/uL (ref 0.0–0.7)
Eosinophils Relative: 5 % (ref 0–5)
Lymphocytes Relative: 34 % (ref 12–46)
Lymphs Abs: 3.1 10*3/uL (ref 0.7–4.0)
Monocytes Absolute: 0.6 10*3/uL (ref 0.1–1.0)
Monocytes Relative: 7 % (ref 3–12)
Neutro Abs: 4.9 10*3/uL (ref 1.7–7.7)
Neutrophils Relative %: 54 % (ref 43–77)

## 2010-09-27 LAB — POCT I-STAT, CHEM 8
BUN: 24 mg/dL — ABNORMAL HIGH (ref 6–23)
Calcium, Ion: 1.1 mmol/L — ABNORMAL LOW (ref 1.12–1.32)
Chloride: 105 mEq/L (ref 96–112)
Creatinine, Ser: 0.9 mg/dL (ref 0.4–1.2)
Glucose, Bld: 81 mg/dL (ref 70–99)
HCT: 39 % (ref 36.0–46.0)
Hemoglobin: 13.3 g/dL (ref 12.0–15.0)
Potassium: 3.7 mEq/L (ref 3.5–5.1)
Sodium: 139 mEq/L (ref 135–145)
TCO2: 29 mmol/L (ref 0–100)

## 2010-09-27 LAB — URINALYSIS, ROUTINE W REFLEX MICROSCOPIC
Bilirubin Urine: NEGATIVE
Glucose, UA: NEGATIVE mg/dL
Hgb urine dipstick: NEGATIVE
Ketones, ur: NEGATIVE mg/dL
Leukocytes, UA: NEGATIVE
Nitrite: NEGATIVE
Protein, ur: 100 mg/dL — AB
Specific Gravity, Urine: 1.012 (ref 1.005–1.030)
Urobilinogen, UA: 1 mg/dL (ref 0.0–1.0)
pH: 5.5 (ref 5.0–8.0)

## 2010-09-27 LAB — POCT CARDIAC MARKERS
CKMB, poc: <1 ng/mL — ABNORMAL LOW (ref 1.0–8.0)
Myoglobin, poc: 86.5 ng/mL (ref 12–200)
Troponin i, poc: <0.05 ng/mL (ref 0.00–0.09)

## 2010-09-28 LAB — CBC
HCT: 36 % (ref 36.0–46.0)
Hemoglobin: 12 g/dL (ref 12.0–15.0)
MCHC: 33.2 g/dL (ref 30.0–36.0)
MCV: 87.7 fL (ref 78.0–100.0)
Platelets: 266 10*3/uL (ref 150–400)
RBC: 4.11 MIL/uL (ref 3.87–5.11)
RDW: 15.2 % (ref 11.5–15.5)
WBC: 5.9 10*3/uL (ref 4.0–10.5)

## 2010-09-28 LAB — BASIC METABOLIC PANEL
BUN: 10 mg/dL (ref 6–23)
CO2: 29 mEq/L (ref 19–32)
Calcium: 8.7 mg/dL (ref 8.4–10.5)
Chloride: 102 mEq/L (ref 96–112)
Creatinine, Ser: 0.74 mg/dL (ref 0.4–1.2)
GFR calc Af Amer: >60 mL/min (ref >60)
GFR calc non Af Amer: >60 mL/min (ref >60)
Glucose, Bld: 206 mg/dL — ABNORMAL HIGH (ref 70–99)
Potassium: 3.6 mEq/L (ref 3.5–5.1)
Sodium: 138 mEq/L (ref 135–145)

## 2010-09-28 LAB — DIFFERENTIAL
Basophils Absolute: 0 10*3/uL (ref 0.0–0.1)
Basophils Relative: 1 % (ref 0–1)
Eosinophils Absolute: 0.2 10*3/uL (ref 0.0–0.7)
Eosinophils Relative: 4 % (ref 0–5)
Lymphocytes Relative: 35 % (ref 12–46)
Lymphs Abs: 2.1 10*3/uL (ref 0.7–4.0)
Monocytes Absolute: 0.4 10*3/uL (ref 0.1–1.0)
Monocytes Relative: 7 % (ref 3–12)
Neutro Abs: 3.2 10*3/uL (ref 1.7–7.7)
Neutrophils Relative %: 54 % (ref 43–77)

## 2010-09-28 LAB — CK TOTAL AND CKMB (NOT AT ARMC)
CK, MB: 0.8 ng/mL (ref 0.3–4.0)
Relative Index: INVALID (ref 0.0–2.5)
Total CK: 48 U/L (ref 7–177)

## 2010-09-28 LAB — BRAIN NATRIURETIC PEPTIDE: Pro B Natriuretic peptide (BNP): <30 pg/mL (ref 0.0–100.0)

## 2010-09-28 LAB — D-DIMER, QUANTITATIVE (NOT AT ARMC): D-Dimer, Quant: <0.22 ug/mL-FEU (ref 0.00–0.48)

## 2010-09-28 LAB — TROPONIN I: Troponin I: 0.03 ng/mL (ref 0.00–0.06)

## 2010-10-17 LAB — GLUCOSE, CAPILLARY
Glucose-Capillary: 179 mg/dL — ABNORMAL HIGH (ref 70–99)
Glucose-Capillary: 205 mg/dL — ABNORMAL HIGH (ref 70–99)
Glucose-Capillary: 247 mg/dL — ABNORMAL HIGH (ref 70–99)
Glucose-Capillary: 255 mg/dL — ABNORMAL HIGH (ref 70–99)
Glucose-Capillary: 273 mg/dL — ABNORMAL HIGH (ref 70–99)
Glucose-Capillary: 280 mg/dL — ABNORMAL HIGH (ref 70–99)
Glucose-Capillary: 304 mg/dL — ABNORMAL HIGH (ref 70–99)
Glucose-Capillary: 89 mg/dL (ref 70–99)

## 2010-10-17 LAB — POCT CARDIAC MARKERS
CKMB, poc: <1 ng/mL — ABNORMAL LOW (ref 1.0–8.0)
CKMB, poc: <1 ng/mL — ABNORMAL LOW (ref 1.0–8.0)
Myoglobin, poc: 46.7 ng/mL (ref 12–200)
Myoglobin, poc: 50.7 ng/mL (ref 12–200)
Troponin i, poc: <0.05 ng/mL (ref 0.00–0.09)
Troponin i, poc: <0.05 ng/mL (ref 0.00–0.09)

## 2010-10-17 LAB — CBC
HCT: 33.8 % — ABNORMAL LOW (ref 36.0–46.0)
Hemoglobin: 11.2 g/dL — ABNORMAL LOW (ref 12.0–15.0)
MCHC: 33.3 g/dL (ref 30.0–36.0)
MCV: 85.1 fL (ref 78.0–100.0)
Platelets: 236 10*3/uL (ref 150–400)
RBC: 3.97 MIL/uL (ref 3.87–5.11)
RDW: 14.6 % (ref 11.5–15.5)
WBC: 5.8 10*3/uL (ref 4.0–10.5)

## 2010-10-17 LAB — URINALYSIS, ROUTINE W REFLEX MICROSCOPIC
Bilirubin Urine: NEGATIVE
Glucose, UA: NEGATIVE mg/dL
Hgb urine dipstick: NEGATIVE
Ketones, ur: NEGATIVE mg/dL
Nitrite: NEGATIVE
Protein, ur: 30 mg/dL — AB
Specific Gravity, Urine: 1.011 (ref 1.005–1.030)
Urobilinogen, UA: 0.2 mg/dL (ref 0.0–1.0)
pH: 5.5 (ref 5.0–8.0)

## 2010-10-17 LAB — BASIC METABOLIC PANEL
BUN: 12 mg/dL (ref 6–23)
CO2: 27 mEq/L (ref 19–32)
Calcium: 8.6 mg/dL (ref 8.4–10.5)
Chloride: 101 mEq/L (ref 96–112)
Creatinine, Ser: 0.75 mg/dL (ref 0.4–1.2)
GFR calc Af Amer: >60 mL/min (ref >60)
GFR calc non Af Amer: >60 mL/min (ref >60)
Glucose, Bld: 334 mg/dL — ABNORMAL HIGH (ref 70–99)
Potassium: 3.6 mEq/L (ref 3.5–5.1)
Sodium: 134 mEq/L — ABNORMAL LOW (ref 135–145)

## 2010-10-17 LAB — URINE MICROSCOPIC-ADD ON

## 2010-10-17 LAB — DIFFERENTIAL
Basophils Absolute: 0 10*3/uL (ref 0.0–0.1)
Basophils Relative: 1 % (ref 0–1)
Eosinophils Absolute: 0.3 10*3/uL (ref 0.0–0.7)
Eosinophils Relative: 4 % (ref 0–5)
Lymphocytes Relative: 38 % (ref 12–46)
Lymphs Abs: 2.2 10*3/uL (ref 0.7–4.0)
Monocytes Absolute: 0.3 10*3/uL (ref 0.1–1.0)
Monocytes Relative: 6 % (ref 3–12)
Neutro Abs: 3 10*3/uL (ref 1.7–7.7)
Neutrophils Relative %: 51 % (ref 43–77)

## 2010-10-17 LAB — LIPID PANEL
Cholesterol: 151 mg/dL (ref 0–200)
HDL: 38 mg/dL — ABNORMAL LOW (ref >39)
LDL Cholesterol: 77 mg/dL (ref 0–99)
Total CHOL/HDL Ratio: 4 RATIO
Triglycerides: 178 mg/dL — ABNORMAL HIGH (ref <150)
VLDL: 36 mg/dL (ref 0–40)

## 2010-10-17 LAB — CARDIAC PANEL(CRET KIN+CKTOT+MB+TROPI)
CK, MB: 0.4 ng/mL (ref 0.3–4.0)
CK, MB: 0.5 ng/mL (ref 0.3–4.0)
CK, MB: 0.5 ng/mL (ref 0.3–4.0)
Relative Index: INVALID (ref 0.0–2.5)
Relative Index: INVALID (ref 0.0–2.5)
Relative Index: INVALID (ref 0.0–2.5)
Total CK: 33 U/L (ref 7–177)
Total CK: 36 U/L (ref 7–177)
Total CK: 37 U/L (ref 7–177)
Troponin I: 0.01 ng/mL (ref 0.00–0.06)
Troponin I: 0.01 ng/mL (ref 0.00–0.06)
Troponin I: 0.03 ng/mL (ref 0.00–0.06)

## 2010-10-17 LAB — RAPID URINE DRUG SCREEN, HOSP PERFORMED
Amphetamines: NOT DETECTED
Barbiturates: NOT DETECTED
Benzodiazepines: NOT DETECTED
Cocaine: NOT DETECTED
Opiates: NOT DETECTED
Tetrahydrocannabinol: NOT DETECTED

## 2010-10-17 LAB — D-DIMER, QUANTITATIVE: D-Dimer, Quant: <0.22 ug/mL-FEU (ref 0.00–0.48)

## 2010-10-17 LAB — HEMOGLOBIN A1C
Hgb A1c MFr Bld: 10.8 % — ABNORMAL HIGH (ref 4.6–6.1)
Mean Plasma Glucose: 263 mg/dL

## 2010-10-19 LAB — DIFFERENTIAL
Basophils Absolute: 0 10*3/uL (ref 0.0–0.1)
Basophils Relative: 0 % (ref 0–1)
Eosinophils Absolute: 0.3 10*3/uL (ref 0.0–0.7)
Eosinophils Relative: 3 % (ref 0–5)
Lymphocytes Relative: 25 % (ref 12–46)
Lymphs Abs: 2 10*3/uL (ref 0.7–4.0)
Monocytes Absolute: 0.5 10*3/uL (ref 0.1–1.0)
Monocytes Relative: 6 % (ref 3–12)
Neutro Abs: 5.2 10*3/uL (ref 1.7–7.7)
Neutrophils Relative %: 65 % (ref 43–77)

## 2010-10-19 LAB — URINALYSIS, ROUTINE W REFLEX MICROSCOPIC
Bilirubin Urine: NEGATIVE
Glucose, UA: NEGATIVE mg/dL
Ketones, ur: NEGATIVE mg/dL
Nitrite: POSITIVE — AB
Protein, ur: >300 mg/dL — AB
Specific Gravity, Urine: 1.016 (ref 1.005–1.030)
Urobilinogen, UA: 0.2 mg/dL (ref 0.0–1.0)
pH: 5.5 (ref 5.0–8.0)

## 2010-10-19 LAB — CBC
HCT: 39.5 % (ref 36.0–46.0)
Hemoglobin: 12.8 g/dL (ref 12.0–15.0)
MCHC: 32.2 g/dL (ref 30.0–36.0)
MCV: 84.9 fL (ref 78.0–100.0)
Platelets: 293 10*3/uL (ref 150–400)
RBC: 4.66 MIL/uL (ref 3.87–5.11)
RDW: 15.5 % (ref 11.5–15.5)
WBC: 7.9 10*3/uL (ref 4.0–10.5)

## 2010-10-19 LAB — POCT I-STAT, CHEM 8
BUN: 14 mg/dL (ref 6–23)
Calcium, Ion: 1.05 mmol/L — ABNORMAL LOW (ref 1.12–1.32)
Chloride: 104 mEq/L (ref 96–112)
Creatinine, Ser: 0.8 mg/dL (ref 0.4–1.2)
Glucose, Bld: 194 mg/dL — ABNORMAL HIGH (ref 70–99)
HCT: 41 % (ref 36.0–46.0)
Hemoglobin: 13.9 g/dL (ref 12.0–15.0)
Potassium: 3.9 mEq/L (ref 3.5–5.1)
Sodium: 136 mEq/L (ref 135–145)
TCO2: 22 mmol/L (ref 0–100)

## 2010-10-19 LAB — GLUCOSE, CAPILLARY: Glucose-Capillary: 185 mg/dL — ABNORMAL HIGH (ref 70–99)

## 2010-10-19 LAB — URINE MICROSCOPIC-ADD ON

## 2010-10-19 LAB — PREGNANCY, URINE: Preg Test, Ur: NEGATIVE

## 2010-10-20 LAB — DIFFERENTIAL
Basophils Absolute: 0 10*3/uL (ref 0.0–0.1)
Basophils Relative: 0 % (ref 0–1)
Eosinophils Absolute: 0.3 10*3/uL (ref 0.0–0.7)
Eosinophils Relative: 5 % (ref 0–5)
Lymphocytes Relative: 28 % (ref 12–46)
Lymphs Abs: 1.7 10*3/uL (ref 0.7–4.0)
Monocytes Absolute: 0.4 10*3/uL (ref 0.1–1.0)
Monocytes Relative: 6 % (ref 3–12)
Neutro Abs: 3.7 10*3/uL (ref 1.7–7.7)
Neutrophils Relative %: 61 % (ref 43–77)

## 2010-10-20 LAB — COMPREHENSIVE METABOLIC PANEL
ALT: 35 U/L (ref 0–35)
AST: 38 U/L — ABNORMAL HIGH (ref 0–37)
Albumin: 3.1 g/dL — ABNORMAL LOW (ref 3.5–5.2)
Alkaline Phosphatase: 145 U/L — ABNORMAL HIGH (ref 39–117)
BUN: 10 mg/dL (ref 6–23)
CO2: 29 mEq/L (ref 19–32)
Calcium: 9 mg/dL (ref 8.4–10.5)
Chloride: 100 mEq/L (ref 96–112)
Creatinine, Ser: 0.75 mg/dL (ref 0.4–1.2)
GFR calc Af Amer: >60 mL/min (ref >60)
GFR calc non Af Amer: >60 mL/min (ref >60)
Glucose, Bld: 389 mg/dL — ABNORMAL HIGH (ref 70–99)
Potassium: 3.7 mEq/L (ref 3.5–5.1)
Sodium: 137 mEq/L (ref 135–145)
Total Bilirubin: 0.5 mg/dL (ref 0.3–1.2)
Total Protein: 7 g/dL (ref 6.0–8.3)

## 2010-10-20 LAB — URINALYSIS, ROUTINE W REFLEX MICROSCOPIC
Bilirubin Urine: NEGATIVE
Glucose, UA: >1000 mg/dL — AB
Ketones, ur: NEGATIVE mg/dL
Leukocytes, UA: NEGATIVE
Nitrite: NEGATIVE
Protein, ur: >300 mg/dL — AB
Specific Gravity, Urine: 1.022 (ref 1.005–1.030)
Urobilinogen, UA: 1 mg/dL (ref 0.0–1.0)
pH: 6 (ref 5.0–8.0)

## 2010-10-20 LAB — URINE MICROSCOPIC-ADD ON

## 2010-10-20 LAB — GLUCOSE, CAPILLARY
Glucose-Capillary: 246 mg/dL — ABNORMAL HIGH (ref 70–99)
Glucose-Capillary: 247 mg/dL — ABNORMAL HIGH (ref 70–99)
Glucose-Capillary: 262 mg/dL — ABNORMAL HIGH (ref 70–99)
Glucose-Capillary: 291 mg/dL — ABNORMAL HIGH (ref 70–99)
Glucose-Capillary: 358 mg/dL — ABNORMAL HIGH (ref 70–99)

## 2010-10-20 LAB — CBC
HCT: 35.8 % — ABNORMAL LOW (ref 36.0–46.0)
Hemoglobin: 12.1 g/dL (ref 12.0–15.0)
MCHC: 33.6 g/dL (ref 30.0–36.0)
MCV: 84.1 fL (ref 78.0–100.0)
Platelets: 262 10*3/uL (ref 150–400)
RBC: 4.26 MIL/uL (ref 3.87–5.11)
RDW: 14.8 % (ref 11.5–15.5)
WBC: 6 10*3/uL (ref 4.0–10.5)

## 2010-10-28 LAB — URINALYSIS, ROUTINE W REFLEX MICROSCOPIC
Bilirubin Urine: NEGATIVE
Glucose, UA: NEGATIVE mg/dL
Ketones, ur: NEGATIVE mg/dL
Leukocytes, UA: NEGATIVE
Nitrite: NEGATIVE
Protein, ur: >300 mg/dL — AB
Specific Gravity, Urine: 1.016 (ref 1.005–1.030)
Urobilinogen, UA: 1 mg/dL (ref 0.0–1.0)
pH: 6 (ref 5.0–8.0)

## 2010-10-28 LAB — GLUCOSE, CAPILLARY: Glucose-Capillary: 173 mg/dL — ABNORMAL HIGH (ref 70–99)

## 2010-10-28 LAB — URINE MICROSCOPIC-ADD ON

## 2010-11-25 NOTE — H&P (Signed)
NAMEJANNIS, [name] NO.:  [id number]   MEDICAL RECORD NO.:  [id number]          PATIENT TYPE:  INP   LOCATION:  1416                         FACILITY:  [facility]   PHYSICIAN:  Nolene Ebbs, M.D.    DATE OF BIRTH:  03-Jun-1956   DATE OF ADMISSION:  02/20/2007  DATE OF DISCHARGE:                              HISTORY & PHYSICAL   PRESENTING COMPLAINTS:  Shortness of breath, chest pain.   HISTORY OF PRESENT ILLNESS:  [name] is a 55 year old African American  lady with past medical history significant for hypertension, chronic  anemia, type 2 diabetes mellitus, congestive heart failure and poor  compliance with prescribed therapy.  She presented to emergency room at  [facility] brought in by the EMS with onset of chest pain  associated with shortness of breath that started 24 hours prior to  presentation.  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  She developed sudden weakness,  fatigue and chest discomfort, could not catch a breath.  She rested for  awhile, subsequently went home to rest most of the day, thinking that  she would feel better.  She awoke on Sunday morning with fatigue and  dehydrated.  She was about to eat breakfast and went back to work.  While she was at work, she had a recurrence of the episode.  She had had  some cough productive of yellowish sputum, but there was no hemoptysis.  She had no fevers or chills.  She denies any orthopnea, PND,  palpitations, but she had become more anxious and nervous.  She stated  that she felt more short of breath after she ate and had to use the  bathroom, and her stools were loose, and she also had some pain in her  left flank region.  She denied any dysuria, urinary frequency or  hematuria.  She had no pains in her joints or swelling in her joints.  He denies any dizziness, blurring of vision, weakness of extremities,  seizures or syncope.  There was  no associated nausea or vomiting.   PAST MEDICAL HISTORY:  1. Type 2 diabetes mellitus and usually poorly controlled due to      noncompliance with prescribed therapy.  2. Allergic rhinitis.  3. Systemic hypertension.  4. Depression and anxiety.  5. Iron-deficiency anemia.  6. History of congestive heart failure with diastolic dysfunction.  7. Hysterectomy.  8. Cholecystectomy.   MEDICATION HISTORY:  1. Diovan/HCT 160/12.5 daily.  2. Lexapro 10 mg daily.  3. Trazodone 25 mg one to two q.h.s.  4. Lasix 40 mg daily.  5. Potassium chloride 10 mEq daily.  6. Ferrous sulfate 325 mg once a day.  7. Glucophage 1000 mg b.i.d.  8. Glipizide 10 mg b.i.d.  9. NovoLog 70/30 55 units in a.m. and 45 units p.m.  10.Allegra 180 mg daily.  11.Astelin nasal spray 2 sprays each nostril daily.  12.Advair Diskus 50/50 one inhalation b.i.d.  13.Albuterol MDI 2 puffs q.6h p.r.n.   ALLERGIES:  She denies  any drug allergies.   SOCIAL HISTORY:  She is single.  She has two daughters 28 and 77 who  live with her.  She has no use of alcohol, tobacco or illicit drugs.   REVIEW OF SYSTEMS:  Essentially as above.   PHYSICAL EXAMINATION:  GENERAL APPEARANCE:  She is lying in the hospital  bed, not in acute respiratory or painful distress.  She is not pale.  She is not icteric.  She is not cyanosed.  Pupils are equal and reactive  to accommodation.  VITAL SIGNS:  Initial vital signs showed a blood pressure 129/79, heart  rate of 99, respiratory rate 28, temperature is 98, O2 sats of 2 liters  on nasal cannula is 100%.  NECK:  Supple with no elevated JVD.  No cervical lymphadenopathy.  CHEST:  Shows good air entry bilaterally with occasional rhonchi with no  rales or wheezes.  There is tenderness to palpation on the anterior  chest wall.  Heart sounds were not heard with no murmurs.  ABDOMEN:  Obese, soft, nontender, no masses.  Bowel sounds present.  EXTREMITIES:  Shows no edema, no calf tenderness or  swelling.  CNS: She is alert, oriented x3 with no focal neurological deficits.   LABORATORY DATA:  Initial CBG was 362.  Urine pregnancy test was  negative.  Urinalysis was negative.  BNP was less than 30.  D-dimer was  less than 0.22.  CKs and troponin and point of care x2 in the emergency  room were negative.  White count was 9.5, hemoglobin 12.6, hematocrit 38  and platelets 357.  Chest x-Kulikowski is reported as showing left lower lobe  atelectasis with no other abnormalities.  EKG is not available in the  chart for review.   ASSESSMENT:  [name] is a 55 year old African American lady with  multiple medical problems and especially multiple risk factors for  coronary artery disease, presented to the emergency room with shortness  of breath and chest pain associated with cough productive of yellowish  sputum.  She has no fevers or chills she had a pustular presentation  back in September 2007, and at that time was treated for acute  bronchitis with resolution of her symptoms.  Her chest x-Ditmars today shows  left lower lobe atelectasis.  I suspect that she may have bronchitis  versus pneumonia.   ADMISSION DIAGNOSIS:  1. Acute bronchitis.  Rule out pneumonia.  2. Anxiety disorder.  3. Type 2 diabetes mellitus, uncontrolled.  4. Systemic hypertension.  5. History of congestive heart failure, diastolic dysfunction.   PLAN OF CARE:  She will be admitted to the hospital and put on  telemetry.  Vital signs will be checked q.4 h, CBG a.c. and h.s.  Serial  CKs and troponin were performed to rule out acute coronary syndrome.  I  put her on oral  antibiotics as well as nebulized bronchodilators, and she will be  resumed on her anxiolytic agents.  Condition will be monitored closely,  and changes made to her treatment based on her response.  This plan of  care has been discussed with her and her questions answered.Nolene Ebbs, M.D.  Electronically Signed     EA/MEDQ  D:   02/21/2007  T:  02/22/2007  Job:  [id number]

## 2010-11-25 NOTE — Discharge Summary (Signed)
NAMEBRESEIS, DELMAN NO.:  [id number]   MEDICAL RECORD NO.:  [id number]          PATIENT TYPE:  INP   LOCATION:  1435                         FACILITY:  [facility]   PHYSICIAN:  Neysa Bonito, MD  DATE OF BIRTH:  1956-03-19   DATE OF ADMISSION:  04/05/2008  DATE OF DISCHARGE:  04/08/2008                               DISCHARGE SUMMARY   ADDENDUM:   DISCHARGE MEDICATIONS:  1. Glipizide 10 mg daily.  2. Metformin 1000 mg twice a day.  3. Iron 1 tab daily.  4. Lasix 40 mg daily.  5. Potassium chloride 20 mEq daily.  6. Hydrochlorothiazide 25 mg daily.  7. Vasotec 5 mg p.o. daily.  8. Insulin 70/30 65 units in the morning and 55 units at bedtime.  9. Cyanocobalamin/B12 1000 mcg every week for the coming 4 weeks.      Neysa Bonito, MD  Electronically Signed     EME/MEDQ  D:  04/08/2008  T:  04/09/2008  Job:  [id number]   cc:   Louretta Shorten  Fax: [phone]

## 2010-11-25 NOTE — H&P (Signed)
NAMEBRENLEIGH, [name] NO.:  [id number]   MEDICAL RECORD NO.:  [id number]          PATIENT TYPE:  EMS   LOCATION:  ED                           FACILITY:  [facility]   PHYSICIAN:  Sharlet Salina, M.D.   DATE OF BIRTH:  1956/04/11   DATE OF ADMISSION:  04/05/2008  DATE OF DISCHARGE:                              HISTORY & PHYSICAL   CHIEF COMPLAINT:  Chest pain and shortness of breath.   HISTORY OF PRESENT ILLNESS:  The patient is a 55 year old African  American female that presented to the emergency room secondary to chest  pain and shortness of breath.  She stated she has been having these  symptoms for about a month.  She stated that back in August, she had an  upper respiratory infection and she went to see her primary care doctor  in [place].  She was given some antibiotic and it got better.  Then 2  weeks after, she developed shortness of breath and now with chest pain.  She states that she is not really coughing.  She had  some chills, but  no fever, no nausea, no vomiting.  She states that the pain does not get  worse with pressing on her chest, with movement or with coughing.  She  states that it is more like a pressure.  She also complains of a slight  headache.   She was also in the hospital back in August 2008 with shortness of  breath and chest pain.  At that time was diagnosed with acute  bronchitis.  She was ruled out for acute coronary syndrome during that  hospitalization with serial enzymes.  She was placed on antibiotics and  bronchodilators during that admission as well.   PAST MEDICAL HISTORY:  1. Significant for diabetes type 2.  2. Allergic rhinitis.  3. Hypertension.  4. Depression.  5. Anxiety.  6. Iron-deficiency anemia.  7. Question of diastolic CHF.  Her echo in the computer was fairly      normal.  8. Status post cholecystectomy.   FAMILY HISTORY:  Her mother died from colon cancer.  Her father died  from a blood clot.  They both  died in their 35s.   SOCIAL HISTORY:  She is single.  She has 3 children, one 20, one 27 and  her son is 57.  She drinks occasional alcohol.  No tobacco use.  No  illicit drug use.   MEDICATIONS:  1. She takes 2 blood pressure medications, she cannot remember the      name.  2. Glipizide 10 mg twice daily.  3. Glucophage 500 mg twice daily.  4. Iron daily.  5. Lasix 40 mg daily.  6. Potassium 20 mEq daily.   ALLERGIES:  NO KNOWN DRUG ALLERGIES.   REVIEW OF SYSTEMS:  Negative otherwise as stated in the HPI.   PHYSICAL EXAMINATION:  VITAL SIGNS:  Temperature 98.1, blood pressure  153/80, pulse of 61, respirations 9, pulse ox 99% on 2 liters.  GENERAL:  The patient is sitting up in bed and no accessory muscle use.  Does not seem to be in any respiratory distress.  HEENT:  Head is normocephalic, atraumatic.  Pupils are reactive to  light.  Throat is without erythema.  CARDIOVASCULAR:  Regular rate and rhythm with a 2/6 systolic murmur.  LUNGS:  Mostly clear diffusely.  ABDOMEN:  Soft, nontender, nondistended.  Positive bowel sounds.  EXTREMITIES:  Without edema, 2+ DP pulse bilaterally.   LABORATORY DATA:  1. EKG:  Sinus brady, otherwise normal.  No acute ST-segment elevation      or depression.  2. Chest x-[place] showed no acute cardiopulmonary process.   LABORATORY DATA:  BNP 50.6.  Cardiac markers:  Troponin less than 0.05,  D-dimer less than 0.22.  BMP:  Sodium 139, potassium 3.9, chloride 106,  CO2 28, glucose 254, BUN 9, creatinine 0.84, calcium 9.  LFTs are  pending   ASSESSMENT:  1. Chest pain, rule out myocardial infarction.  2. Diabetes.  3. Hypertension.  4. Morbid obesity.  5. Allergic rhinitis.  6. Anemia.   PLAN:  We will admit the patient to the hospital.  We will rule out with  serial enzymes.  Her EKG is normal.  We will also get a 2-D echo.  We  will check an anemia panel.  Continue her on her iron supplements.  We  will also start her on bronchodilator  albuterol every 6 hours.  We will  put her on Z-Pak for presumed bronchitis.  However, her chest is clear.  She has no leukocytosis or fever.  Remainder of management will be  determined as the patient's hospital stay progresses.  For her diabetes,  we will check her hemoglobin A1c, put her on her home regimen and  sliding-scale insulin.      Sharlet Salina, M.D.  Electronically Signed     NJ/MEDQ  D:  04/05/2008  T:  04/05/2008  Job:  [id number]

## 2010-11-25 NOTE — Discharge Summary (Signed)
[name], VATTER NO.:  [id number]   MEDICAL RECORD NO.:  [id number]          PATIENT TYPE:  INP   LOCATION:  [id number]                         FACILITY:  [facility]   PHYSICIAN:  Neysa Bonito, MD  DATE OF BIRTH:  Apr 05, 1956   DATE OF ADMISSION:  04/05/2008  DATE OF DISCHARGE:  04/08/2008                               DISCHARGE SUMMARY   PRIMARY CARE PHYSICIAN:  Dr. Amalia Hailey in [place].   This patient is unassigned to [facility].   DATE OF ADMISSION:  April 05, 2008.   DATE OF DISCHARGE:  April 08, 2008.   CHIEF COMPLAINT AND HISTORY OF PRESENT ILLNESS:  A 55 year old pleasant  African American female admitted with shortness of breath and chest  pain.   HOSPITAL PROCEDURES:  The patient had cardiac enzymes 4 times during the  hospital stay which is all negative.   Her anemia panel done on the April 05, 2008, was showing  reticulocyte percent is 1.7, absolute reticulocyte is 61.0.  Iron is 39,  total iron binding capacity is 342.  Percent saturation is 11.  Vitamin  B12 is 182, folate is more than 20.  Please note, the normal range for  B12 in our lab is between 211-911, which means the patient has a B12  deficiency.  Hemoglobin A1c on April 06, 2008, was 10.2.  Today's  lab is showing sodium 138, potassium 3.7, chloride 102, bicarb 28,  glucose 210, BUN is 13, creatinine is 0.74, white blood count is 7.5,  hemoglobin is 10.7, hematocrit is 32.5, platelet count is 274 and MCV is  83.8.   The patient had chest x-Rawl on the day of admission, impression was  reading no active cardiopulmonary disease.   On April 07, 2008, the patient had CT angiogram to the chest with  the impression showing:  1. No evidence of acute pulmonary embolus.  2. Bilateral pulmonary atelectasis.  3. Benign 5.5 mm pulmonary nodule in the right upper lobe,      demonstrating stability since March 23, 2006.   The patient had a two-dimensional echo on  April 06, 2008, impression  was showing:  1. Overall left ventricular systolic function was normal.  Left      ventricular ejection fraction was stated to be 60%.  There were no      left ventricular regional wall motion abnormalities.  2. Normal aortic valve.  The main transaortic valve gradient was 14      mmHg.  Estimated aortic valve area by VPI was 1.79 cm2.  Estimated      aortic valve area by Vmax was 1.54 cm2.  Left atrial size was at      the upper limits of normal.   HOSPITAL COURSE:  1. Shortness of breath and chest pain.  The patient was ruled out for      acute coronary syndrome by serial cardiac enzymes and EKGs.  The      result was negative for acute cardiac event.  The patient was      suspected to have bibasilar pneumonia as per the  discussion on the      CT angiogram above.  The patient was continued on Zithromax and she      will be discharged on azithromycin to finish the dose as      recommended.  2. Diabetes, uncontrolled.  Patient is taking insulin, high dose of      sulfonylurea and metformin.  Medication has changed to increase the      metformin to 1000 mg and decrease the dose of sulfonylurea to 10 mg      of glipizide.  The patient will be continued on 70/30 as before.  I      suspect the 70/30 might need to be changed but I would leave dose      change to the primary physician since now the oral hypoglycemic      medication has been changed while she is in the hospital.  Average      CBG during the hospital stay is 200 before this change in the dose      of metformin.  3. Hypertension.  Patient was taking 2 medicines for blood pressure.      She is not sure of the name of those 2 medicines.  Nevertheless,      the patient will be started on hydrochlorothiazide and Vasotec 5 mg      p.o. twice a day to control the blood pressure.   DISCHARGE DIAGNOSIS:  1. Chest pain.  The patient is ruled out for myocardial      infarction/acute coronary syndrome.   2. Shortness of breath felt secondary to pneumonia as evident on the      CT angiogram of bilateral pulmonary atelectasis.  3. Diabetes uncontrolled.  4. Hypertension.  5. Morbid obesity.  6. Anemia.  7. B12 deficiency.   The patient is stable for discharge today after a first dose of  cyanocobalamin.  The patient will be following up with her primary  physician as discussed with her.  She is aware and agreeable to  discharge plan.      Neysa Bonito, MD  Electronically Signed     EME/MEDQ  D:  04/08/2008  T:  04/09/2008  Job:  [id number]   cc:   Dr. Laqueta Carina, [place]

## 2010-11-28 NOTE — Consult Note (Signed)
NAMEAIRANNA, HAINLINE NO.:  [id number]   MEDICAL RECORD NO.:  [id number]          PATIENT TYPE:  INP   LOCATION:  [id number]                         FACILITY:  [facility]   PHYSICIAN:  Felizardo Hoffmann, M.D.  DATE OF BIRTH:  Jan 30, 1956   DATE OF CONSULTATION:  03/25/2006  DATE OF DISCHARGE:                                   CONSULTATION   Reason for priority has to do with her being discharged very soon and  needing a dictation for follow-up planning.   Mrs. Hochstein states that she slept well with 25 mg of Trazodone without adverse  effect.  She feels much less anxious today and has much less worrying today.  She has been able to enjoy her religious scripture as well as making  progress ambulating the hallway.  She has been able to communicate with  outside friends and family members by the phone without getting anxious.  She is not having any adverse psychotropic effects.  Her hope is solid and  her interest continue to be within normal limits.  She is socially  appropriate.   VITAL SIGNS:  Temperature 98.1, pulse 75, respiration 18, blood pressure  142/55.  O2 saturation on room air 92%.  The patient only required 25 mg of  Trazodone last night for normal sleep.  MENTAL STATUS EXAM:  Mrs. Sundet is oriented to all spheres.  She is alert.  Her speech is within normal limits.  Thought process is logical, coherent  and goal directed.  No lucent of association.  Thought content:  No thoughts  of harming herself.  No thoughts of harming others.  No delusions, no  hallucinations.  Memory is intact to immediate, recent, remote.  Affect is  slightly flat with a broad, appropriate range as she continues the interview  and discusses her long-term goals.  Her insight is good.  Judgment is  intact.  She is alert with good eye contact.   ASSESSMENT:  Code 293.84, anxiety disorder, not otherwise specified.  Rule  out generalized anxiety disorder.  History of substance abuse.   RECOMMENDATIONS:  1. Outpatient 12-step groups.  2. Continue the Lexapro trial at 10 mg q.a.m.  3. Continue Trazodone at 25 mg q.h.s. with the option of increasing by      another 25 mg if needed in the future.  4. Would ask the case manager to call one of the local clinics at either      [facility], [facility], or [place] [facility] to set      up a follow-up psychiatric appointment with psychotherapy after      discharge.  Would request that the psychotherapy include cognitive      behavioral therapy with deep breathing and progressive muscle      relaxation training.  5. A second option for follow-up arrangement could involve the case      manager getting in touch with the health care insurance company and      allowing the patient to chose from the psychiatric [facility] panel with      her insurance  company.      Felizardo Hoffmann, M.D.  Electronically Signed     JW/MEDQ  D:  03/26/2006  T:  03/26/2006  Job:  [id number]

## 2010-11-28 NOTE — Discharge Summary (Signed)
NAME:  [name], [name] NO.:  [id number]   MEDICAL RECORD NO.:  [id number]                   PATIENT TYPE:  INP   LOCATION:  5505                                 FACILITY:  [facility]   PHYSICIAN:  Deborah Chalk, M.D.            DATE OF BIRTH:  Oct 13, 1955   DATE OF ADMISSION:  04/07/2002  DATE OF DISCHARGE:  04/12/2002                                 DISCHARGE SUMMARY   DISCHARGE DIAGNOSES:  1. Pyelonephritis with left flank pain.  2. Diabetes mellitus type 2.  3. Hypertension.  4. History of herpes.   DISCHARGE MEDICATIONS:  1. Tequin 400 mg p.o. q.d. for five days.  2. Glucophage 1,000 mg b.i.d.  3. Glipizide 10 mg b.i.d.  4. Enalapril 20 mg one half tablet q.d.  5. Hydrochlorothiazide 25 mg q.d.  6. Aspirin 81 mg q.d.  7. Valtrex p.r.n.   CHIEF COMPLAINT:  Fevers, chills, and abdominal pain.   HISTORY OF PRESENT ILLNESS:  [name] is a 55 year old woman who I have  seen once prior in the office to this visit. She saw the nurse practitioner  and complained of fevers and chills with left abdominal pain and body aches  for about 24 hours. She also had a lot of nasal congestion and headache.  This occurred the day prior to her being seen at work, and she had to go  home. She took some hydrocodone and Flexeril and went to sleep. She awakened  early this morning with the same symptoms, attempting to go to work but  unable to do so because of extraordinary pain and body aches. Chest x-Morfin in  the office was negative for any infiltrate. Her urine was positive for white  blood cells and red blood cells. H and H was normal.   ALLERGIES:  No known drug allergies.   PAST MEDICAL HISTORY:  1. Hypertension.  2. Diabetes.  3. Herpes.   MEDICATIONS:  As above.   PAST SURGICAL HISTORY:  Laparoscopic cholecystectomy in 10/89.   SIGNIFICANT FINDINGS:  She was acutely ill in the office with a temperature  to 101.7. Sinuses were tender. Heart was  regular and normal. Abdomen was  significant for tenderness suprapubically with CVA tenderness and bilateral  flank pain to percussion. Pelvic and rectal exam were deferred at that time.  Again, urinalysis was grossly positive.   HOSPITAL COURSE:  She was admitted for IV hydration and intravenous Tequin  therapy for acute pyelonephritis. She remained febrile for a couple of days,  and a chest x-[place]ogan was obtained. There was a question of atelectasis versus  pneumonia on a chest x-Conradt. She was continued on the Tequin. Pain control  was at first attempted with oral medications, but she became disoriented and  finally did well on Dilaudid. She has a full course of five days with Tequin  therapy and finally became afebrile. Her white blood cell count normalized.  In terms  of her diabetes, her blood sugars were a little bit out of control,  but her diet was not what she was accustomed to taking. She remained on her  Glipizide, Glucophage, and a sliding scale. A PIC line had been inserted  since she was very dehydrated and it was difficult to get a good IV catheter  into her. This was taken out the day afterward for significant pain and also  because she was much better.   DISCHARGE CONDITION:  Stable and improved.   DISCHARGE INSTRUCTIONS:  She was to resume her home medications and to  complete the course of Tequin with five more days of oral therapy. She was  to call the office with any increased pain or fevers or chills. We will  obtain an abdominal CT to rule out an abscess. She was to see me in the  office within one week of discharge and also to remain out of work until  that time.                                                Deborah Chalk, M.D.    SEJ/MEDQ  D:  05/09/2002  T:  05/09/2002  Job:  [id number]

## 2010-11-28 NOTE — Consult Note (Signed)
NAMELUARA, MCQUILLAN NO.:  [id number]   MEDICAL RECORD NO.:  [id number]          PATIENT TYPE:  INP   LOCATION:  1603                         FACILITY:  [facility]   PHYSICIAN:  Felizardo Hoffmann, M.D.  DATE OF BIRTH:  Apr 03, 1956   DATE OF CONSULTATION:  03/24/2006  DATE OF DISCHARGE:                                   CONSULTATION   REQUESTING PHYSICIAN:  Nolene Ebbs, M.D.   REASON FOR CONSULTATION:  Depression and anxiety.   HISTORY OF PRESENT ILLNESS:  Ms. Janiak is a 55 year old female admitted to the  [facility] on September 9 due to shortness of breath,  chest pain and leg pain.   Ms. Ciak describes several weeks of depressed mood, decreased energy,  difficulty concentrating, lack of interest, and insomnia.  She also reports  a long-term history of excessive worry, feeling on edge, and muscle tension.  She has no thoughts of harming herself or others.  She has no hallucinations  or delusions, and she is socially appropriate.   The patient describes a number of stresses.  She has been working full-time  and her 72 year old child has been leaving school.  She has been charged by  the court for her child leaving school and yet the patient has not been able  to resolve this problem since she has to be at work.  Most recently her  child has been admitted to [facility].   PAST PSYCHIATRIC HISTORY:  Ms. Gracian underwent drug rehabilitation in the  early 1990s and was in a 4-week residential program in [place], [place].  She was suffering from depression at that time and was started on  Zoloft.  She only took it for 3 days.  She did not have any adverse effects  from it.   The patient had a recurrence of depression a few years ago and went to her  employment Dietitian.  She states that she went for one  visit and did not get the impression that that was going to help and she  stopped.   She has no history of mania,  hallucinations or delusions.   She does have a long-term history of insomnia, excessive worry, feeling on  edge and muscle tension.   FAMILY PSYCHIATRIC HISTORY:  None known.   SOCIAL HISTORY:  Ms. Morss was born in [place], [place].  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  She has worked in many  group homes.   She is divorced and has 3 children, ages 40 down to 63.  Please see the  history of present illness.   The patient does not drink.  She has not used cocaine since the early 1990s  and does not use any other illegal drugs.   Religion:  Jehovah's Witness.   The patient states that she has not been involved in her 12-step community  on a regular basis but does attend special gatherings for [employer] periodically.  GENERAL MEDICAL PROBLEMS:  1. Diabetes.  2. Hypertension.  3. Anemia.  4. History of CHF.  5. History of hysterectomy.   ALLERGIES:  No known drug allergies.   MEDICATIONS:  The patient was just started on Lexapro 10 mg q.a.m. 2 days  ago.  She is not on any other psychotropic medication.   LABORATORY DATA:  CBC is remarkable for a decreased hemoglobin at 9.2.  Metabolic panel shows increased glucose of 160, SGOT is 22, SGPT 24, calcium  is normal at 8.5.  Hemoglobin A1c is increased at 8.5.  Thyroid-stimulating  hormone is normal at 1.6.   REVIEW OF SYSTEMS:  CONSTITUTIONAL:  Afebrile.  HEAD:  No trauma.  EYES:  No  visual changes.  EARS:  No hearing impairment.  NOSE:  No rhinorrhea.  MOUTH, THROAT:  No sore throat.  NEUROLOGIC:  Unremarkable.  PSYCHIATRIC:  As above.  CARDIOVASCULAR:  No current chest pain.  RESPIRATION:  No  coughing or wheezing.  GASTROINTESTINAL:  No nausea, vomiting or diarrhea.  GENITOURINARY:  No dysuria.  SKIN:  Unremarkable.  MUSCULOSKELETAL:  No  deformities, weaknesses or atrophy.  ENDOCRINE/METABOLIC:  As  above.  The  patient states that her daughter reports that she snores very loudly.  HEMATOLOGIC/LYMPHATIC:  Anemia with a normal ferritin, a normal B12, and a  normal folic acid test.   EXAMINATION:  VITAL SIGNS:  Temperature 97.1, pulse 63, respirations 18,  blood pressure 166/78.  CBGs are running 193, 222, and 198.  MENTAL STATUS EXAMINATION:  Ms. Kerry is a pleasant and alert and socially  appropriate middle-aged female sitting in her hospital chair with a  constricted affect and depressed mood.  Her thought process is logical  coherent, goal-directed, and no looseness of association.  Thought content:  No thoughts of harming herself, no thoughts of harming others, no delusions,  no hallucinations.  Intelligence and fund of knowledge are average to above  average.  Her speech involves normal rate and prosody.  Memory is intact to  immediate, recent and remote.  Orientation is intact to all spheres.  Concentration is mildly decreased.  Insight is good.  Judgment is intact.   ASSESSMENT:  Axis I:  293.84, anxiety disorder not otherwise specified, rule out  generalized anxiety disorder; major depressive disorder, recurrent, moderate  to severe.  Axis II:  Deferred.  Axis III:  See general medical problems.  Axis IV:  Primary support group.  Axis V:  55.   Ms. Monestime is not at risk to harm herself or others.  She agrees to use  emergency services for thoughts of harming herself, thoughts of harming  others, or distress or any other emergent psychiatric symptoms.   The indications, alternatives and adverse effects of Lexapro and trazodone  were discussed with the patient.  She understands the above information and  wants to proceed with Lexapro for antidepression and antianxiety along with  trazodone utilized for insomnia and augmenting Lexapro.   The role of psychotherapy was also discussed with the patient as well as general education about causes of depression and anxiety.  Ego  supportive  psychotherapy was rendered.   RECOMMENDATIONS:  1. Would continue the Lexapro trial at 10 mg q.a.m. for antidepression and      anxiety.  2. Would start trazodone 25 mg q.h.s. p.r.n. insomnia with a repeat 25 mg      in one hour if no response.  Then would dose trazodone the following      night  based upon what was necessary the first night, increasing if      necessary and tolerated by 25 mg per evening to no greater than 200 mg      q.h.s. until insomnia is resolved.  The patient agrees to not drive if      drowsy.  3. Would ask the case manager to set the patient up for outpatient      psychotropic medication and psychotherapy at one of the clinics      attached to a local hospital such as [facility], or [facility].  Also, the patient's managed      care panel can be a source of possible psychiatrists in the area.  4. Regarding psychotherapy, would ask the case manager to arrange her      follow-up with a psychotherapist that can provide cognitive behavioral      therapy including deep breathing and progressive muscle relaxation.   DISCUSSION:  The Lexapro antianxiety effects, particularly the cognitive  antianxiety effects, can take as long as 3 times that of the antidepression  effects.  It may take as long as 16 weeks for the Lexapro to have its  maximum effect upon excessive worry.  Also, it may require increasing the  Lexapro to 20 mg daily.  As mentioned above, the synergism of Lexapro with  psychotherapy is best.      Felizardo Hoffmann, M.D.  Electronically Signed     JW/MEDQ  D:  03/24/2006  T:  03/25/2006  Job:  [id number]

## 2010-11-28 NOTE — Op Note (Signed)
[name], [name]                 ACCOUNT NO.:  [id number]   MEDICAL RECORD NO.:  [id number]          PATIENT TYPE:  AMB   LOCATION:  SDC                           FACILITY:  [facility]   PHYSICIAN:  Naima A. Dillard, M.D. DATE OF BIRTH:  18-Oct-1955   DATE OF PROCEDURE:  04/29/2004  DATE OF DISCHARGE:                                 OPERATIVE REPORT   PREOPERATIVE DIAGNOSES:  Submucosal fibroid with menorrhagia.   POSTOPERATIVE DIAGNOSES:  Submucosal fibroid with menorrhagia, endometrial  polyps.   PROCEDURE:  Hysteroscopic resection of endometrial polyps and submucosal  fibroid.   SURGEON:  Naima A. Dillard, M.D.   IV FLUIDS:  2400 mL crystalloid.   HYSTEROSCOPIC DEFICIT:  3% sorbitol 2s 250 mL.   ESTIMATED BLOOD LOSS:  Minimal.   COMPLICATIONS:  None.   FINDINGS:  Small endometrial polyp noted. There is a 2-3 cm fibroid with  submucosal component on the anterior wall of the uterus. There is a small 1  cm polypoid mass at the fundus. The patient went to the recovery room in  stable condition.   DESCRIPTION OF PROCEDURE:  The patient was taken to the operating room where  she was given general anesthesia with a laryngeal mask airway, placed in the  dorsal lithotomy position and then prepped and draped in a normal sterile  fashion. A bivalve speculum was placed into the vagina, the anterior lip of  the cervix was grasped with a single tooth tenaculum, 10 mL 1% lidocaine was  placed in the anterior lip of the cervix for anesthesia postoperatively.  The cervix was then dilated with [facility] dilators of 21, diagnostic  hysteroscope was placed into the uterine cavity, __________ seen. The  hysteroscope was removed, the cervix was further dilated with Kennon Rounds dilators  to a 33. The resectoscope was then placed into the uterine cavity and using  the Endoloop with cut and cautery, the submucosal component fibroid was  removed. Hemostasis was assured, polyp forceps were then placed into the  uterine cavity and small polyps were removed.  Hemostasis as assured. The  resectoscope was then placed back into the uterine cavity and the fibroid  was resected, the polyps were noted to be removed. A sharp curettage of the  uterine wall was then done and endometrial curettings were sent for  pathology. The hysteroscope was placed into  the uterine cavity and the myoma had been resected, the polyps had been  removed and hemostasis was assured. All instruments were then removed from  the uterus, cervix and vagina. Hemostasis noted at the tenaculum site.  Sponge, lap and needle counts were correct x2. The patient went to the  recovery room in stable condition.      NAD/MEDQ  D:  04/29/2004  T:  04/29/2004  Job:  [id number]

## 2010-11-28 NOTE — H&P (Signed)
NAME:  [name], ROUND NO.:  [id number]   MEDICAL RECORD NO.:  [id number]                   PATIENT TYPE:  INP   LOCATION:  5505                                 FACILITY:  [facility]   PHYSICIAN:  Deborah Chalk, M.D.            DATE OF BIRTH:  [birth year]   DATE OF ADMISSION:  04/07/2002  DATE OF DISCHARGE:                                HISTORY & PHYSICAL   CHIEF COMPLAINT:  Fever, chills, left flank pain, headache, nasal  congestion, and body aches for the last 24 hours.   HISTORY OF PRESENT ILLNESS:  The patient became acutely ill yesterday  afternoon at work.  Developed headache, chills, shortness of breath on  exertion and went home.  She developed body aches and congestion in the  sinuses.  She is producing mucus of unknown color.   She took hydrocodone and Flexeril and went to sleep.   She awoke in the early a.m. with the same symptoms.   She attempted to go to work but was unable to work so came in for further  evaluation.   ALLERGIES:  No known drug allergies.   MEDICATIONS:  1. Glucophage 1000 mg b.i.d.  2. Glipizide 10 mg one b.i.d.  3. Enalapril 1/2 tablet q.d.  4. Hydrochlorothiazide 25 mg one q.d.  5. Aspirin 81 mg one q.d.  6. Valtrex as needed.   PAST MEDICAL HISTORY:  1. Hypertension.  2. Diabetes.  3. Herpes.   PAST SURGICAL HISTORY:  Laparoscopic cholecystectomy in October 1989.   FAMILY HISTORY:  Mother deceased at age 26 of colon cancer.  Father alive  with lung cancer and blood clots in his legs.  One brother alive with  diabetes and hypertension.  One sister died of AIDS, one brother died of  AIDS.  Three other sisters alive and well.   SOCIAL HISTORY:  No smoking.  Occasional alcohol.   REVIEW OF SYSTEMS:  Please see Chief Complaint.   PHYSICAL EXAMINATION:  GENERAL:  Acutely ill female.  VITAL SIGNS:  Weight 263.  Temperature 101.7, pulse 115, respirations 24.  Pulse oximetry 94%.  HEENT:  Sinuses tender  throughout.  Ears clear.  Eyes:  PER.  Nose and  throat clear.  LUNGS:  Clear to P&A.  HEART:  Regular rate and rhythm with normal S1 & S2 with no S3 or S4.  No  murmur, gallop, rub, or click.  ABDOMEN:  CVA tenderness bilaterally.  Left flank pain to percussion and  suprapubic tenderness.  EXTREMITIES:  Moves all extremities x4.  No edema.  RECTAL:  Deferred.  PELVIC:  Deferred.  NEUROLOGICAL:  Oriented x3.  Cranial nerves II-XII intact.  Good reflexes,  sensation, and motor function.   LABORATORY DATA:  Amylase and lipase as well as liver function is normal.  CBC shows an H&H of 11.1/34.5, white count normal.  UA with wbc's 10-20 per  high-power field as well as rbc's  5-10 per high-power field.   ASSESSMENT:  Probable sinusitis/pyelonephritis.   PLAN:  Admit for IV antibiotics and IV hydration.     Audrea Muscat Placey, N.P.                     Deborah Chalk, M.D.    MAP/MEDQ  D:  04/07/2002  T:  04/10/2002  Job:  [id number]

## 2010-11-28 NOTE — H&P (Signed)
[name], [name]                 ACCOUNT NO.:  [id number]   MEDICAL RECORD NO.:  [id number]          PATIENT TYPE:  AMB   LOCATION:  SDC                           FACILITY:  [facility]   PHYSICIAN:  Naima [name]. Dillard, M.D. DATE OF BIRTH:  January 27, 1956   DATE OF ADMISSION:  DATE OF DISCHARGE:                                HISTORY & PHYSICAL   CHIEF COMPLAINT:  Symptomatic fibroids and menorrhagia.   HISTORY AND PHYSICAL:  Patient is [name] 55 year old African-American female  gravida 4 para 3-0-1-3 who presented to me back in May 2005 complaining of  irregular painful periods and heavy periods.  Patient stated that since 2002  she has had pain with her periods.  She states that she has [name] period every  month that lasts about 6-8 days.  She changes [name] pad and tampon every hour  and uses Depends at night.  Patient denied having any chest pain, shortness  of breath, though stated that she used to be on birth control pills and  stopped about 12-13 years ago.  Patient does have [name] history of fibroids,  stated that she just started [name] new medication which was Zyrtec and stated  she had no new stressors.  Patient denied having any vaginal discharge,  odor, fever.  She does have occasional dyspareunia.  She denied having any  dysuria, urgency, frequency, hematuria, or history of kidney stones.  The  patient denied having any constipation, diarrhea, rectal bleeding, nausea,  vomiting or any chance she could be pregnant, does state she has [name] history  of fibroids, had [name] last ultrasound in 2000.   PAST GYNECOLOGIC HISTORY:  Menarche started at age 21 occurring every month  lasting for 8 days.  She does have [name] history of HSV (last outbreak was about  [name] year ago).  Patient states she had [name] normal Pap smear in 2000 (denies  having any abnormal Pap smear).   PAST MEDICAL HISTORY:  1.  Diabetes.  2.  High blood pressure.  3.  Sinusitis.   PAST SURGICAL HISTORY:  1.  Bilateral tubal ligation.  2.  Eye  surgery.  3.  Cholecystectomy 15 years ago that was done laparoscopically.   ALLERGIES:  Patient has no known drug allergies.   SOCIAL HISTORY:  Patient denies having any alcohol or illicit drug use.   PAST OBSTETRICAL HISTORY:  Vaginal delivery x3 without any complications and  one miscarriage in the first trimester when she did have [name] D&E.   MEDICATIONS:  Medications include hydrochlorothiazide, Allegra, Zyrtec,  iron, Glucophage, glipizide, and Diovan.   FAMILY HISTORY:  High blood pressure, diabetes, asthma, and cancer.   REVIEW OF SYSTEMS:  CARDIOVASCULAR:  Patient states she has [name] heart murmur  but does not need antibiotics when she has surgery.  RESPIRATORY:  Occasional bronchitis.  GI:  Unremarkable.  GENITOURINARY:  As noted above  and patient also stated that she had some coughing when she sneezes.  PSYCHIATRIC:  Unremarkable.   PHYSICAL EXAMINATION:  VITAL SIGNS:  Patient weighs 270 pounds, blood  pressure 120/60.  HEENT:  Pupils  are equal.  Hearing is normal.  Throat is clear.  Thyroid is  not enlarged.  HEART:  Regular rate and rhythm.  LUNGS:  Clear to auscultation bilaterally.  BREASTS:  No masses, discharge, skin changes, or nipple retraction  bilaterally.  BACK:  No CVA tenderness bilaterally.  ABDOMEN:  Nontender without any mass or organomegaly.  EXTREMITIES:  No cyanosis, clubbing, or edema.  NEURO:  Within normal limits.  VULVOVAGINAL:  Within normal limits.  Cervix is nontender without lesions.  Uterus is top normal size, shape and consistency and nontender.  Adnexa has  no masses and nontender.   PERTINENT LABORATORY DATA:  Patient had an endometrial biopsy done in June  2005 which showed secretory endometrium and was negative for hyperplasia or  malignancy.  GC/Chlamydia negative.  Ultrasound significant for uterus  measuring 11.7 x 5.4 size with an anterior submucosal fibroid measuring 2.4  cm.  TSH was normal at 1.29, prolactin was normal at 8.5,  hemoglobin is 9.1.  Pap smear was found to be within normal limits.   ASSESSMENT:  Submucosal fibroid, menorrhagia, loss of urine with sneezing,  which is probably consistent with stress urinary incontinence.   PLAN:  Patient declines any treatment for stress urinary incontinence being  medical or surgical at this point in time.  Patient is scheduled for [name]  sonohistogram to insure that this is submucosal fibroids, because the  patient continues to bleed.  We were unable to schedule it.  Patient decided  to go for [facility] hysteroscopic myomectomy.  She received Lupron in September  3.7 mg IM x1 and monthly D&C hysteroscopic inspection of myomectomy.  She  understands the risks to be but not limited to bleeding, infection, damage  to internal organs such as bowel, bladder, or major blood vessels, with  perforation of the uterus and possibly need for hysterectomy.  Patient was  also given other options:  (1) To do nothing, (2) different medical  treatment such as birth control pills, no hormonal treatment, (3)  hysteroscopic myomectomy, (4) uterine artery embolization, (5) hysterectomy,  and (6) ablation.  Patient has decided to try the hysteroscopic myomectomy.  We will proceed as above.      NAD/MEDQ  D:  04/28/2004  T:  04/28/2004  Job:  [id number]

## 2010-11-28 NOTE — Op Note (Signed)
NAMETACORA, FICCO                 ACCOUNT NO.:  [id number]   MEDICAL RECORD NO.:  [id number]          PATIENT TYPE:  AMB   LOCATION:  SDC                           FACILITY:  [facility]   PHYSICIAN:  Naima A. Dillard, M.D. DATE OF BIRTH:  1956-03-02   DATE OF PROCEDURE:  12/17/2004  DATE OF DISCHARGE:                                 OPERATIVE REPORT   PREOPERATIVE DIAGNOSES:  1.  Symptomatic fibroids.  2.  Dysmenorrhea.  3.  Menorrhagia.   POSTOPERATIVE DIAGNOSES:  1.  Symptomatic fibroids.  2.  Dysmenorrhea.  3.  Menorrhagia.   PROCEDURE:  Dilatation and curettage, hysteroscopy, NovaSure ablation.   SURGEON:  Naima A. Dillard, M.D.   ASSISTANT:  None.   ANESTHESIA:  General laryngeal mask airway.   SPECIMENS:  Endometrial curettings.   ESTIMATED BLOOD LOSS:  Minimal.   COMPLICATIONS:  None.   A 60 mL deficit of LR used for hysteroscopic media.   PROCEDURE IN DETAIL:  She was taken to the operating room, where she was  placed in the dorsal lithotomy position and prepped and draped in a normal  sterile fashion.  A bivalve speculum was placed into the vagina.  The  anterior lip of the cervix was grasped with a single-tooth tenaculum.  The  uterus sounded to 9 cm.  The cervical length was 4 cm.  Cavity length was  then noted to be 5 cm.  The cervix was dilated with Pratt dilators up to 21.  The hysteroscope was then placed into the uterine cavity.  No polyps or  fibroids, submucosal fibroids were seen.  Both ostial were visualized.  The  hysteroscope was removed.  A sharp curettage was done of the uterine cavity,  and scant endometrial curettings were obtained.  The NovaSure apparatus was  then placed into the uterine cavity, placed at the fundus and moved back a  slight bit, then seated.  The cavity width measured to 4.4.  Power was on  121, and the seal was tested and approved and with the NovaSure, the uterus  was ablated for 1 minute 19 seconds.  The NovaSure was then  removed.  The  hysteroscope was replaced into the cavity, and there were no perforations  noted.  The whole entire fundus and top of the uterus was noted to have been  ablated successfully.  All instruments were removed from  the vagina.  The single-tooth tenaculum site had some bleeding, which was  made hemostatic using pressure and silver nitrate.  All instruments were  removed from the vagina.  Sponge, lap and instrument counts were correct x2.  The patient went to the recovery room in stable condition.       NAD/MEDQ  D:  12/17/2004  T:  12/17/2004  Job:  [id number]

## 2010-11-28 NOTE — Discharge Summary (Signed)
[name], [name] NO.:  [id number]   MEDICAL RECORD NO.:  [id number]          PATIENT TYPE:  INP   LOCATION:  [id number]                         [facility]ACILITY:  [facility]   PHYSICIAN:  Nolene Ebbs, M.D.    DATE OF BIRTH:  [birth year]   DATE OF ADMISSION:  03/21/2006  DATE OF DISCHARGE:  03/26/2006                                 DISCHARGE SUMMARY   ADMISSION DIAGNOSES:  Chest pain with dyspnea on exertion.   HISTORY OF PRESENT ILLNESS:  [name] is a 55 year old African-American lady  with past medical history significant for type 2 diabetes mellitus,  hypertension, anemia, congestive heart failure.  She presented to the  emergency room at [facility] with progressive shortness  of breath, chest pain and neck pains.  She has been seen in my office about  a week prior to this presentation and treated for possible acute bronchitis.  She was also very depressed at the time and was started on antidepressant.   On admission, she was not in any acute distress.  Initial blood pressure was  175/90, heart rate of 88, temperature was 98.3.  Her chest exam showed  scattered rhonchi without consolidation, focal decrease in breath sounds.  Initial laboratory data showed a pO2 of 65, PCO2 of 26, pH of 7.46.  White  blood cell count was 8700, hemoglobin 9.2.  Glucose was 110.  Chest x-Ransier  showed a possible retrocardiac infiltrate, so therefore I admitted her to  the hospital for possible pneumonia to rule out pulmonary embolus.   HOSPITAL COURSE:  On admission the patient was stated on intravenous fluids,  intravenous antibiotics with Rocephin and Zithromax.  She received analgesic  therapy for chest pain as well as nebulized bronchodilators for dyspnea.  D-  dimer was 0.24 and a CT scan with angiography was performed to rule out  pulmonary embolism and this was negative only showing bronchitic changes  with a small pleural effusion.  An echocardiogram was performed  and this  showed normal left ventricular ejection fraction of 60%.  An anemia profile  was performed and iron saturation was only 11%.  She was therefore started  on iron replacement therapy.  Antidepressant with Lexapro was continued.  A  psychiatric consult was requested and the patient was seen by [name], M.D., who continued with Lexapro and added Trazodone 25 mg at  bedtime which the patient found very useful for sleep at night.  She was on  Lantus insulin 40 units at bedtime and sliding scale Novolog insulin while  CPK's were monitored a.Heather. and h.s. Condition continued to improved during  her course of hospitalization.  Antibiotics were changed to oral which she  appeared to tolerate.  Patient did not have any fevers or leukocytosis  during the course of admission.  On March 26, 2006 she was feeling much  better, breathing comfortably and ambulating around the hallway.  She was  afebrile, her vital signs are stable.  Chest showed good air entry  bilaterally with no rales, rhonchi or wheezes.  Abdomen was obese, soft and  nontender.  Bowel sounds were present.  Extremities showed no edema, no calf  tenderness or swelling.  She was alert and oriented with no focal  neurological deficits.  She was therefore considered stable for discharged  home.   DISCHARGE DIAGNOSES:  1. Acute bronchitis, rule out pneumonia.  This had improved.  2. Allergic rhinitis.  3. Type 2 diabetes mellitus.  4. Systemic hypertension.  5. Depression.  6. Iron deficiency anemia.   DISCHARGE MEDICATIONS:  1. Trazodone 25 mg 1 to 2 at bedtime.  2. Lexapro 10 mg daily.  3. Diovan HCT 160/12.5 daily.  4. Lasix 40 mg daily.  5. Avelox 400 mg daily for 7 days.  6. Ibuprofen 800 one by mouth three times daily as needed, #30.  7. Ferrous sulfate 325 mg daily.  8. Astelin nasal spray 2 sprays each nostril daily.  9. Vicodin 5/500 1 by mouth every six hours as needed, #30.  10.Albuterol MDI 2 puffs  every six hours as needed.  11.Novolog 70/30 insulin 55 units in the a.m., 40 units in the p.m.   She is to follow up with me in one week on April 02, 2006 at 9 a.m.      Nolene Ebbs, M.D.  Electronically Signed     EA/MEDQ  D:  03/31/2006  T:  04/02/2006  Job:  [id number]

## 2010-11-28 NOTE — Discharge Summary (Signed)
[name], [name] NO.:  [id number]   MEDICAL RECORD NO.:  [id number]          PATIENT TYPE:  INP   LOCATION:  1416                         FACILITY:  [facility]   PHYSICIAN:  Nolene Ebbs, M.D.    DATE OF BIRTH:  08-Aug-1955   DATE OF ADMISSION:  02/20/2007  DATE OF DISCHARGE:  02/23/2007                               DISCHARGE SUMMARY   HISTORY OF PRESENTING ILLNESS:  Please see the dictated History &  Physical of February 20, 2007, for full details.   SUMMARY:  [name] is a 55 year old African-American lady with a past  medical history significant for systemic hypertension, type 2 diabetes  mellitus, poorly controlled due to noncompliance, depression and  anxiety, anemia, congestive heart failure with diastolic dysfunction and  allergic rhinitis.  She presented to the emergency room at [facility] with a 24-hour history of sudden onset chest pain associated  with shortness of breath while she was at work.  She initially went home  and got some rest but she awoke the day of presentation with increasing  fatigue and feeling dehydrated.  She denied having any fevers or chills  but had some cough productive of yellowish sputum, no hemoptysis.  She  had no orthopnea, PND, palpitations.  Patient had become more anxious  and nervous.  At the emergency room she was noted to be anxious but not  in acute respiratory or painful distress, her vital signs were stable  with a blood pressure of 129/79, heart rate of 99, respiratory rate of  28, temperature 98 degrees, O2 sats on 2L was 100%.  Chest showed good  air entry with occasional rhonchi but no rales or wheezes.  There was  tenderness to palpation on the anterior chest wall.  Heart sounds were  heard 1 and 2.  Abdomen was benign.  Her laboratory data showed a CBG of  362.  Urinalysis was negative.  BNP was less than 30.  D-dimer was 0.22.  CPK and troponin point of care x3 in the emergency room were negative.  An EKG shows normal sinus rhythm.  White count was 9.5, hemoglobin 12.6.  Chest x-Miyasato was showing left lower lobe atelectasis.  He was therefore  admitted to the medical bed with a case of acute bronchitis to rule out  pneumonia and anxiety disorder.   HOSPITAL COURSE:  On admission she was started on IV fluids as well as  oral antibiotics and nebulized bronchodilators and also resumed on  anxiolytic agents.  Serial CKs and troponin were performed and  myocardial infarction was ruled out.  Her condition continued to improve  and she was able to ambulate after 24 hours without acute distress.  A  hemoglobin A1c was 10.6.  She required increase in her insulin dose as  well as sliding scale NovoLog insulin to control her hyperglycemia.  On  February 23, 2007, she was very much better, was ambulating in the  hallway, not in acute respiratory or painful distress, vital signs were  stable, CBG was 213, chest was clear to auscultation, abdomen was  benign.  Laboratory showed sodium of 130, chloride 101, bicarbonate of  21, BUN 19, creatinine 0.79.  She is therefore considered stable for  discharge home.   DISCHARGE DIAGNOSES:  1. Acute bronchitis.  2. Type 2 diabetes mellitus, poorly controlled.  3. Anxiety disorder.  4. Hypertension which is well controlled.   DISPOSITION:  Home.   FOLLOWUP:  With me in 3 days in my office.   DISCHARGE MEDICATIONS:  1. Albuterol MDI two puffs q.6 p.r.n.  2. Lexapro 10 mg daily.  3. Klonopin 0.5 mg b.i.d.  4. Avelox 400 mg daily for 5 days.   She is to continue her usual home medications of:  1. Diovan HCT 160/12.5 daily.  2. Glipizide b.i.d.  3. Glucophage 1000 mg b.i.d.  4. Ferrous sulfate 325 mg daily.  5. Lasix 40 mg daily.  6. Potassium chloride 10 mEq daily.  7. NovoLog 70/30 mix 55 units in a.m., 45 units in p.m.  8. Allegra 180 mg daily.  9. Advair Diskus 250/50, one inhalation b.i.d.  10.Nebulized albuterol 2.5 mg q.4h. p.r.n.    DISPOSITION:  Home.   CONDITION ON DISCHARGE:  Stable.      Nolene Ebbs, M.D.  Electronically Signed     EA/MEDQ  D:  03/05/2007  T:  03/06/2007  Job:  [id number]

## 2010-11-28 NOTE — H&P (Signed)
NAMEBAILY, [name]                 ACCOUNT NO.:  [id number]   MEDICAL RECORD NO.:  [id number]          PATIENT TYPE:  AMB   LOCATION:  SDC                           FACILITY:  [facility]   PHYSICIAN:  Naima A. Dillard, M.D. DATE OF BIRTH:  1956/05/24   DATE OF ADMISSION:  DATE OF DISCHARGE:                                HISTORY & PHYSICAL   CHIEF COMPLAINT:  Symptomatic fibroids and menorrhagia, dysmenorrhea.   HISTORY:  The patient is a 55 year old African American female, gravida 4,  para 3-0-1-3, who underwent a D&C hysteroscopy, hysteroscopic myomectomy and  polypectomy, in October 2005, for menorrhagia and symptomatic fibroids.  The  patient stated that her bleeding did improve but shortly after she started  having heavy periods again every month, saying that she would change a pad  or tampon about every hour.  She no longer, however, wear Depends at night.  The patient was scheduled for a hysterectomy but later decided to proceed  with a D&C hysteroscopy and ablation instead.   PAST GYNECOLOGIC HISTORY:  1.  Menarche at age 55, occurring every month, lasting for eight days.  2.  History of HSV.  3.  She has had an abnormal Pap smear 2000, and has not had an abnormal Pap      smear since that time.   PAST MEDICAL HISTORY:  1.  Diabetes.  2.  Hypertension.  3.  Sinusitis.   PAST SURGICAL HISTORY:  1.  Tubal ligation.  2.  Eye surgery.  3.  Cholecystectomy 15 years ago that was done laparoscopically.  4.  D&C hysteroscopy as described.  5.  Myomectomy and polypectomy.   ALLERGIES:  The patient has no known drug allergies.   SOCIAL HISTORY:  The patient denies having any alcohol, tobacco, or illicit  drug use.   PAST OBSTETRICAL HISTORY:  1.  Vaginal delivery x 3 without any complication.  2.  One SAB in the first trimester.  3.  She did have a D&E done.   MEDICATIONS:  Hydrochlorothiazide, Allegra, Zyrtec, iron, Glucophage,  glipizide, and Diovan.   FAMILY HISTORY:   Significant for high blood pressure, diabetes, asthma, and  cancer.   REVIEW OF SYSTEMS:  CARDIOVASCULAR:  The patient has a heart murmur and does  not need antibiotics with surgery.  RESPIRATORY:  Occasional bronchitis.  GI:  Unremarkable.  GENITOURINARY:  As above.  PSYCHIATRIC:  Unremarkable.   PHYSICAL EXAMINATION:  VITAL SIGNS:  The patient weighs 275 pounds, blood  pressure is 110/70.  HEENT:  Pupils are equal.  Hearing is normal.  Throat is clear.  Thyroid is  not enlarged.  HEART:  Regular rate and rhythm.  LUNGS:  Clear to auscultation bilaterally.  BREASTS:  No masses, discharge, skin changes, or nipple retraction.  BACK:  No CVA tenderness.  ABDOMEN:  Nontender without any masses or organomegaly.  EXTREMITIES:  No cyanosis, clubbing, or edema.  NEURO:  Within normal limits.  PELVIC:  Full vaginal exam is within normal limits.  Cervix is nontender  without any lesions.  Uterus is normal shape, size, and consistency.  Adnexa  have no masses.   PERTINENT LABORATORY DATA:  Pregnancy test is negative.  GC and Chlamydia  are negative.  Biopsies, from October 2005, were all negative, benign  endometrial polyps, benign myoma.  Last Pap smear, May 2006, was negative  for intraepithelial lesion or malignancy.   ASSESSMENT:  Menorrhagia.   All treatments were reviewed with the patient, both medical and surgical, to  include but not limited to birth control pills, Depo-Provera, hysterectomy,  uterine  artery embolization , hysterectomy and ablation.  The patient chose  NovaSure ablation.  She understands the risks are, but not limited to,  bleeding, infection, damage to internal organs such as bowel or bladder,  major blood vessels, perforation of the uterus, pyometra.       NAD/MEDQ  D:  12/16/2004  T:  12/16/2004  Job:  [id number]
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
## 2011-04-13 LAB — GLUCOSE, CAPILLARY
Glucose-Capillary: 182 — ABNORMAL HIGH
Glucose-Capillary: 187 — ABNORMAL HIGH
Glucose-Capillary: 194 — ABNORMAL HIGH
Glucose-Capillary: 199 — ABNORMAL HIGH
Glucose-Capillary: 202 — ABNORMAL HIGH
Glucose-Capillary: 209 — ABNORMAL HIGH
Glucose-Capillary: 214 — ABNORMAL HIGH
Glucose-Capillary: 223 — ABNORMAL HIGH
Glucose-Capillary: 231 — ABNORMAL HIGH
Glucose-Capillary: 235 — ABNORMAL HIGH

## 2011-04-13 LAB — COMPREHENSIVE METABOLIC PANEL
ALT: 34
AST: 29
Albumin: 3 — ABNORMAL LOW
Alkaline Phosphatase: 124 — ABNORMAL HIGH
BUN: 10
CO2: 26
Calcium: 9.1
Chloride: 105
Creatinine, Ser: 0.9
GFR calc Af Amer: >60
GFR calc non Af Amer: >60
Glucose, Bld: 218 — ABNORMAL HIGH
Potassium: 3.7
Sodium: 139
Total Bilirubin: 0.6
Total Protein: 6.4

## 2011-04-13 LAB — LIPID PANEL
Cholesterol: 139
HDL: 33 — ABNORMAL LOW
LDL Cholesterol: 80
Total CHOL/HDL Ratio: 4.2
Triglycerides: 131
VLDL: 26

## 2011-04-13 LAB — BASIC METABOLIC PANEL
BUN: 12
BUN: 13
BUN: 9
CO2: 28
CO2: 28
CO2: 29
Calcium: 8.9
Calcium: 9
Calcium: 9.1
Chloride: 102
Chloride: 103
Chloride: 106
Creatinine, Ser: 0.7
Creatinine, Ser: 0.74
Creatinine, Ser: 0.84
GFR calc Af Amer: >60
GFR calc Af Amer: >60
GFR calc Af Amer: >60
GFR calc non Af Amer: >60
GFR calc non Af Amer: >60
GFR calc non Af Amer: >60
Glucose, Bld: 199 — ABNORMAL HIGH
Glucose, Bld: 210 — ABNORMAL HIGH
Glucose, Bld: 254 — ABNORMAL HIGH
Potassium: 3.7
Potassium: 3.9
Potassium: 3.9
Sodium: 138
Sodium: 138
Sodium: 139

## 2011-04-13 LAB — CBC
HCT: 29.2 — ABNORMAL LOW
HCT: 32.1 — ABNORMAL LOW
HCT: 32.5 — ABNORMAL LOW
Hemoglobin: 10.6 — ABNORMAL LOW
Hemoglobin: 10.7 — ABNORMAL LOW
Hemoglobin: 9.7 — ABNORMAL LOW
MCHC: 32.8
MCHC: 32.9
MCHC: 33.1
MCV: 83.8
MCV: 84.1
MCV: 84.6
Platelets: 237
Platelets: 272
Platelets: 274
RBC: 3.44 — ABNORMAL LOW
RBC: 3.82 — ABNORMAL LOW
RBC: 3.88
RDW: 15.1
RDW: 15.3
RDW: 15.5
WBC: 7.1
WBC: 7.5
WBC: 7.9

## 2011-04-13 LAB — FERRITIN: Ferritin: 172 (ref 10–291)

## 2011-04-13 LAB — HEMOGLOBIN A1C
Hgb A1c MFr Bld: 10.2 — ABNORMAL HIGH
Mean Plasma Glucose: 246

## 2011-04-13 LAB — DIFFERENTIAL
Basophils Absolute: 0.1
Basophils Relative: 1
Eosinophils Absolute: 0.2
Eosinophils Relative: 4
Lymphocytes Relative: 24
Lymphs Abs: 1.7
Monocytes Absolute: 0.4
Monocytes Relative: 6
Neutro Abs: 4.6
Neutrophils Relative %: 65

## 2011-04-13 LAB — CARDIAC PANEL(CRET KIN+CKTOT+MB+TROPI)
CK, MB: 0.5
CK, MB: 0.5
Relative Index: INVALID
Relative Index: INVALID
Total CK: 38
Total CK: 39
Troponin I: 0.01
Troponin I: 0.02

## 2011-04-13 LAB — RETICULOCYTES
RBC.: 3.59 — ABNORMAL LOW
Retic Count, Absolute: 61
Retic Ct Pct: 1.7

## 2011-04-13 LAB — POCT CARDIAC MARKERS
CKMB, poc: <1 — ABNORMAL LOW
CKMB, poc: <1 — ABNORMAL LOW
Myoglobin, poc: 38.2
Myoglobin, poc: 45.8
Troponin i, poc: <0.05
Troponin i, poc: <0.05

## 2011-04-13 LAB — HOMOCYSTEINE: Homocysteine: 11.7

## 2011-04-13 LAB — D-DIMER, QUANTITATIVE (NOT AT ARMC): D-Dimer, Quant: <0.22

## 2011-04-13 LAB — VITAMIN B12: Vitamin B-12: 182 — ABNORMAL LOW (ref 211–911)

## 2011-04-13 LAB — B-NATRIURETIC PEPTIDE (CONVERTED LAB): Pro B Natriuretic peptide (BNP): 50.6

## 2011-04-13 LAB — IRON AND TIBC
Iron: 39 — ABNORMAL LOW
Saturation Ratios: 11 — ABNORMAL LOW
TIBC: 342
UIBC: 303

## 2011-04-13 LAB — METHYLMALONIC ACID, SERUM: Methylmalonic Acid, Quantitative: 178 nmol/L (ref 87–318)

## 2011-04-13 LAB — TSH: TSH: 3.858

## 2011-04-13 LAB — FOLATE: Folate: >20

## 2011-04-27 LAB — BASIC METABOLIC PANEL
BUN: 19
CO2: 21
Calcium: 8.6
Chloride: 101
Creatinine, Ser: 0.79
GFR calc Af Amer: >60
GFR calc non Af Amer: >60
Glucose, Bld: 309 — ABNORMAL HIGH
Potassium: 4.3
Sodium: 130 — ABNORMAL LOW

## 2011-04-27 LAB — URINALYSIS, ROUTINE W REFLEX MICROSCOPIC
Bilirubin Urine: NEGATIVE
Glucose, UA: 100 — AB
Hgb urine dipstick: NEGATIVE
Ketones, ur: NEGATIVE
Nitrite: NEGATIVE
Protein, ur: NEGATIVE
Specific Gravity, Urine: 1.015
Urobilinogen, UA: 0.2
pH: 5.5

## 2011-04-27 LAB — TSH: TSH: 1.851

## 2011-04-27 LAB — HEMOGLOBIN A1C
Hgb A1c MFr Bld: 10.6 — ABNORMAL HIGH
Mean Plasma Glucose: 300

## 2011-04-27 LAB — CARDIAC PANEL(CRET KIN+CKTOT+MB+TROPI)
CK, MB: 0.5
CK, MB: 0.7
Relative Index: INVALID
Relative Index: INVALID
Total CK: 31
Total CK: 31
Troponin I: 0.01
Troponin I: 0.02

## 2011-04-27 LAB — CBC
HCT: 29.9 — ABNORMAL LOW
HCT: 38
Hemoglobin: 10.2 — ABNORMAL LOW
Hemoglobin: 12.6
MCHC: 33.3
MCHC: 34.3
MCV: 77.3 — ABNORMAL LOW
MCV: 78.5
Platelets: 320
Platelets: 357
RBC: 3.87
RBC: 4.84
RDW: 15.9 — ABNORMAL HIGH
RDW: 16 — ABNORMAL HIGH
WBC: 8.4
WBC: 9.5

## 2011-04-27 LAB — CK TOTAL AND CKMB (NOT AT ARMC)
CK, MB: 0.5
Relative Index: INVALID
Total CK: 32

## 2011-04-27 LAB — COMPREHENSIVE METABOLIC PANEL
ALT: 25
AST: 23
Albumin: 3 — ABNORMAL LOW
Alkaline Phosphatase: 111
BUN: 17
CO2: 21
Calcium: 8.3 — ABNORMAL LOW
Chloride: 100
Creatinine, Ser: 0.86
GFR calc Af Amer: >60
GFR calc non Af Amer: >60
Glucose, Bld: 404 — ABNORMAL HIGH
Potassium: 4.4
Sodium: 129 — ABNORMAL LOW
Total Bilirubin: 0.9
Total Protein: 6.5

## 2011-04-27 LAB — TROPONIN I: Troponin I: 0.02

## 2011-04-27 LAB — DIFFERENTIAL
Basophils Absolute: 0
Basophils Relative: 0
Eosinophils Absolute: 0.5
Eosinophils Relative: 6 — ABNORMAL HIGH
Lymphocytes Relative: 22
Lymphs Abs: 2.1
Monocytes Absolute: 0.4
Monocytes Relative: 5
Neutro Abs: 6.4
Neutrophils Relative %: 67

## 2011-04-27 LAB — B-NATRIURETIC PEPTIDE (CONVERTED LAB): Pro B Natriuretic peptide (BNP): <30

## 2011-04-27 LAB — POCT CARDIAC MARKERS
CKMB, poc: <1 — ABNORMAL LOW
CKMB, poc: <1 — ABNORMAL LOW
Myoglobin, poc: 40.8
Myoglobin, poc: 56.3
Operator id: 1211
Operator id: 1211
Troponin i, poc: <0.05
Troponin i, poc: <0.05

## 2011-04-27 LAB — GLUCOSE, RANDOM: Glucose, Bld: 392 — ABNORMAL HIGH

## 2011-04-27 LAB — D-DIMER, QUANTITATIVE (NOT AT ARMC): D-Dimer, Quant: <0.22

## 2011-04-27 LAB — PREGNANCY, URINE: Preg Test, Ur: NEGATIVE

## 2011-04-28 LAB — I-STAT 8, (EC8 V) (CONVERTED LAB)
Acid-Base Excess: 1
BUN: 13
Bicarbonate: 24.9 — ABNORMAL HIGH
Chloride: 104
Glucose, Bld: 296 — ABNORMAL HIGH
HCT: 37
Hemoglobin: 12.6
Operator id: 257131
Potassium: 4.5
Sodium: 137
TCO2: 26
pCO2, Ven: 36.4 — ABNORMAL LOW
pH, Ven: 7.443 — ABNORMAL HIGH

## 2011-05-26 IMAGING — CT CT ABDOMEN W/O CM
2 of 4 series · 17 of 46 positions shown, 19 images · non-contrast
Comparison: None

CT ABDOMEN

CLINICAL DATA: Right flank pain and right groin pain.

CT ABDOMEN AND PELVIS WITHOUT CONTRAST
TECHNIQUE: Multidetector CT imaging of the abdomen and pelvis was
performed following the standard protocol without intravenous
contrast.

[Series 2: rtn a/p w/o · axial · non-contrast · 0.82mm/px · z∈[-507,-107]mm · 14 of 88 slices shown, 16 images]
[im 4/88  soft-tissue]
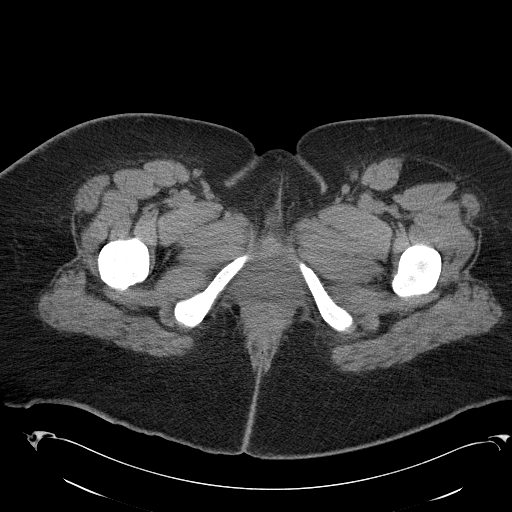
[im 4/88  bone]
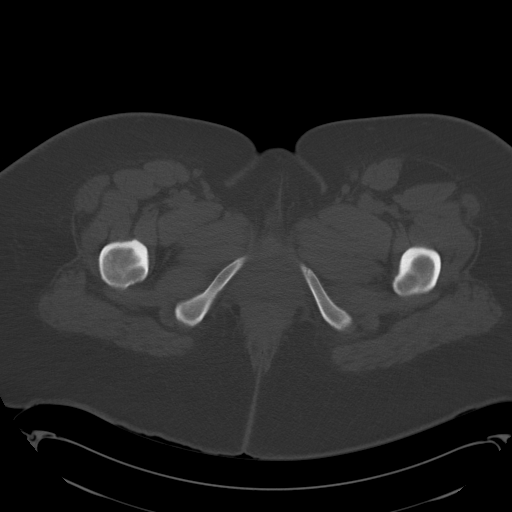
[im 11/88  soft-tissue]
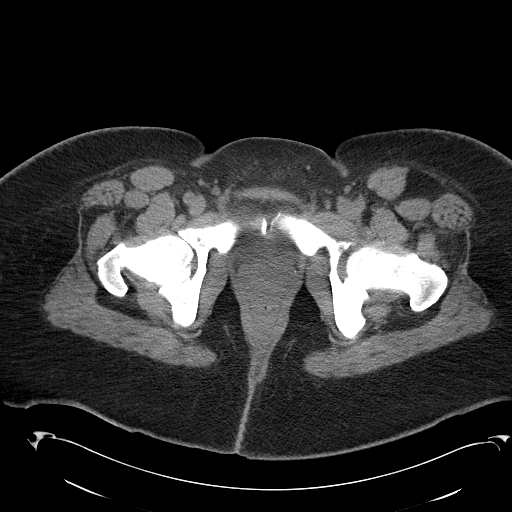
[im 19/88  soft-tissue]
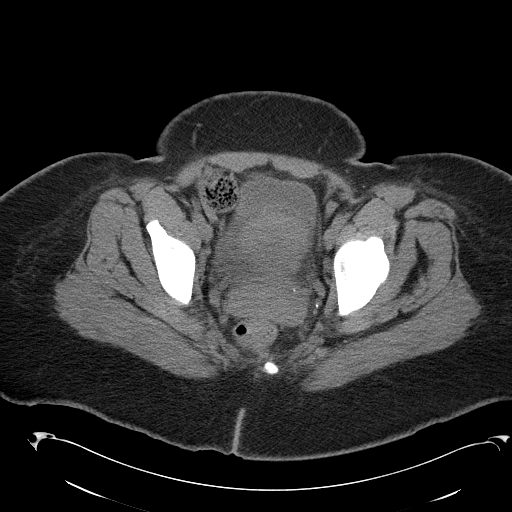
[im 22/88  soft-tissue]
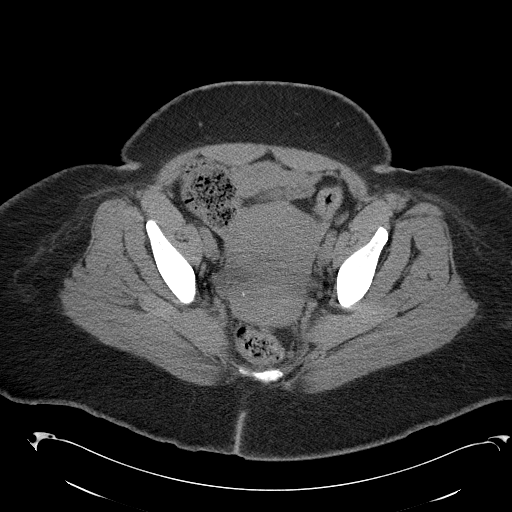
[im 30/88  soft-tissue]
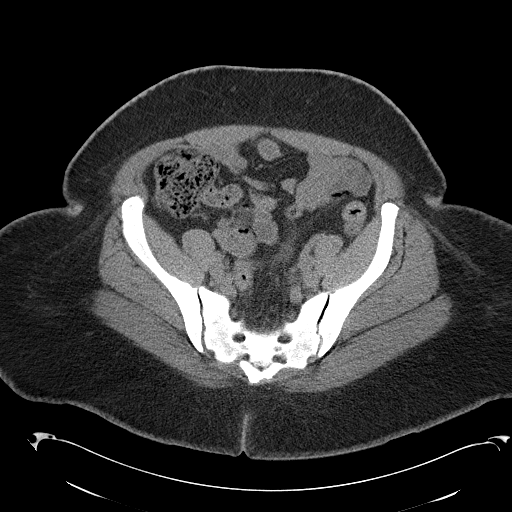
[im 37/88  soft-tissue]
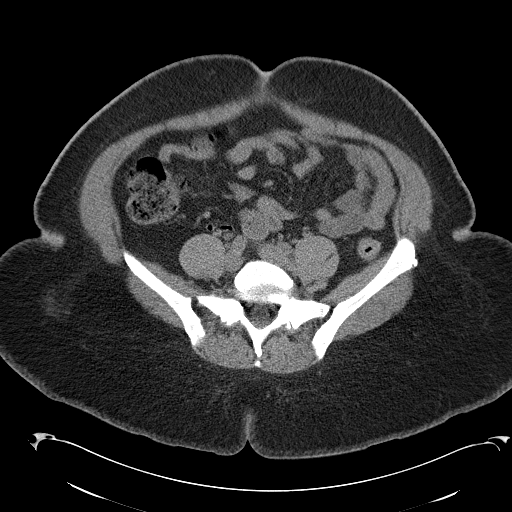
[im 40/88  soft-tissue]
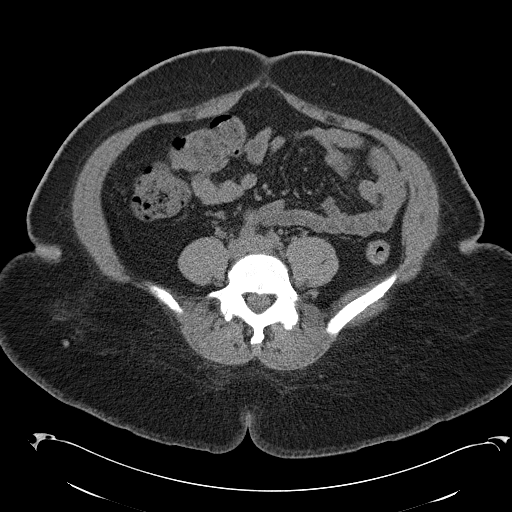
[im 48/88  soft-tissue]
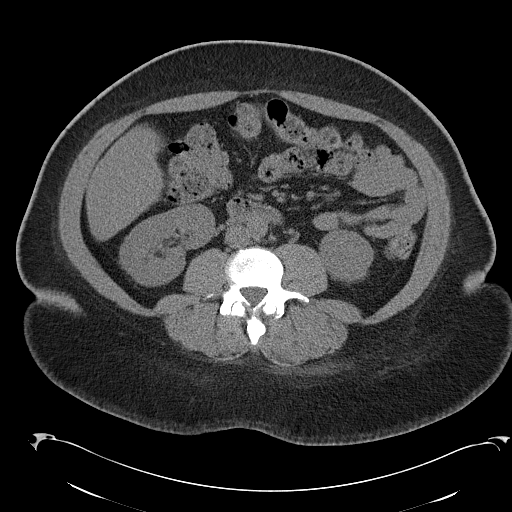
[im 51/88  soft-tissue]
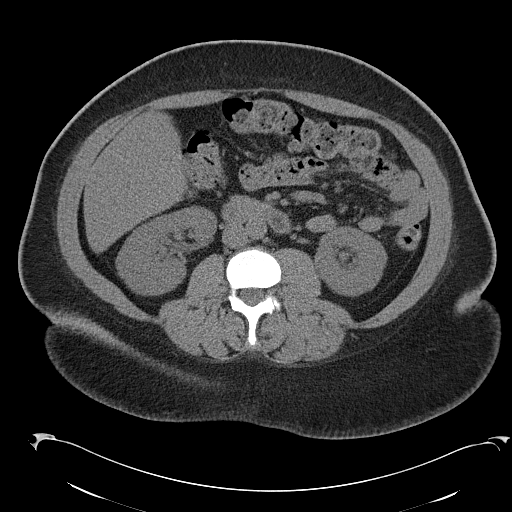
[im 51/88  bone]
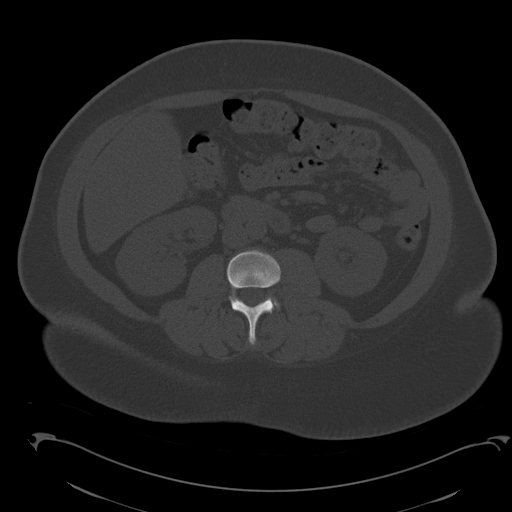
[im 59/88  soft-tissue]
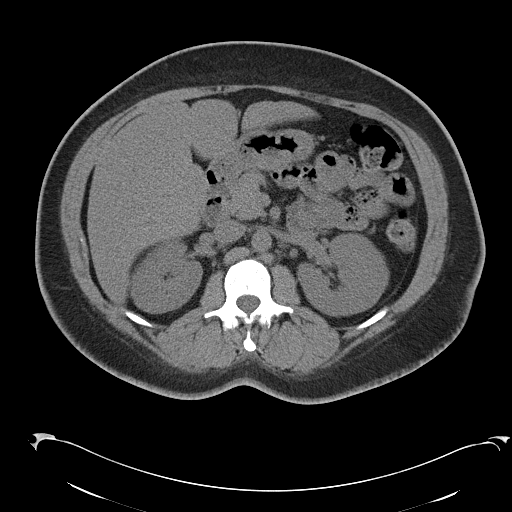
[im 66/88  soft-tissue]
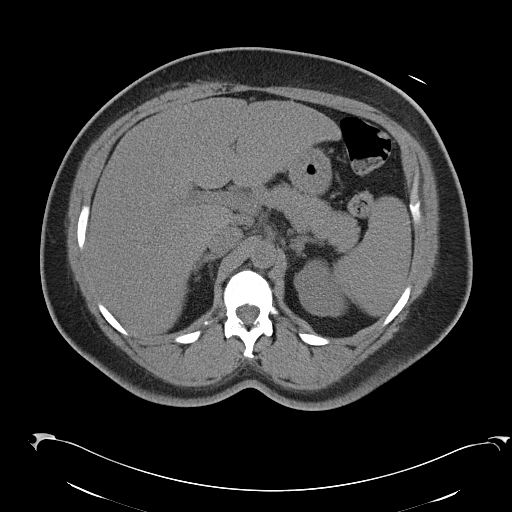
[im 69/88  soft-tissue]
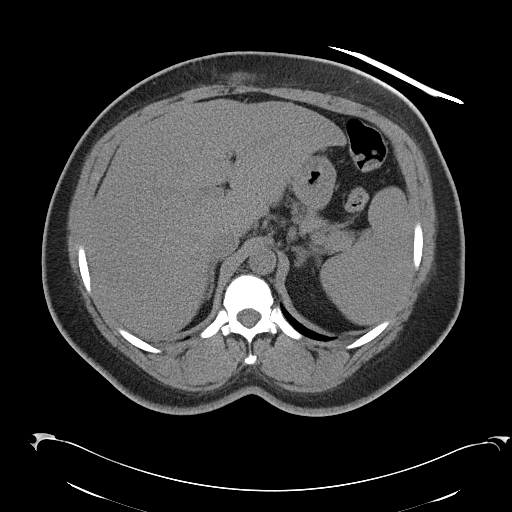
[im 77/88  soft-tissue]
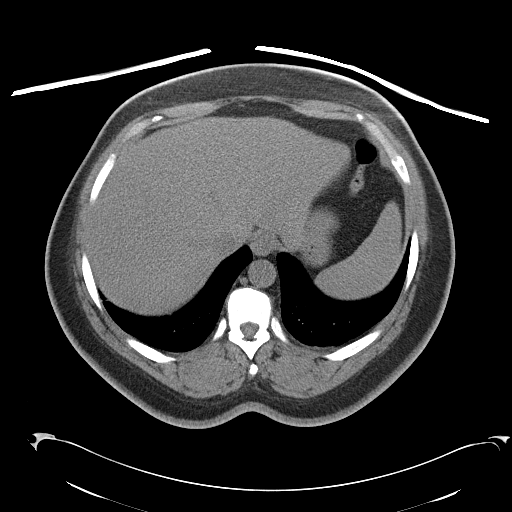
[im 84/88  soft-tissue]
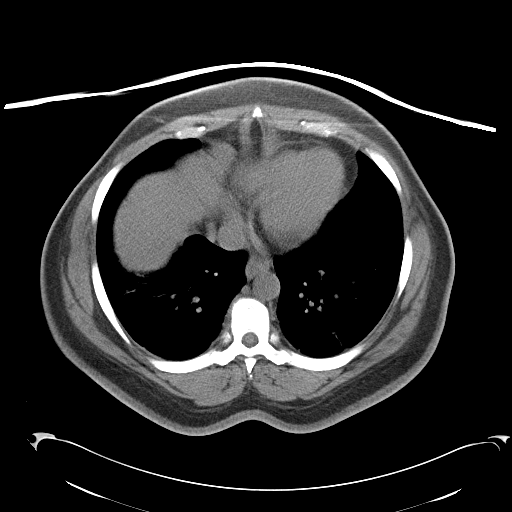

[Series 602: <mpr thick range> · coronal · 0.86mm/px · 3 of 89 slices shown]
[im 30/89  soft-tissue]
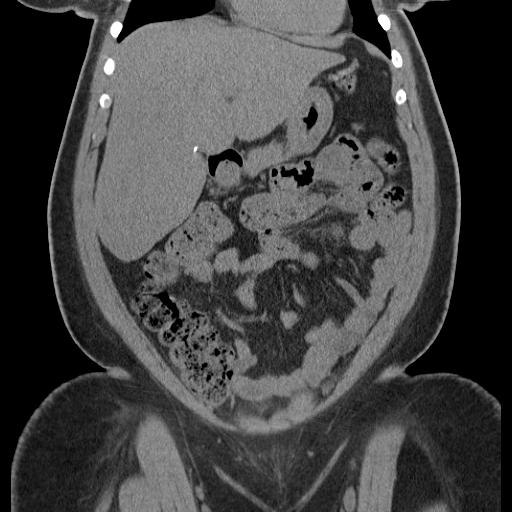
[im 40/89  soft-tissue]
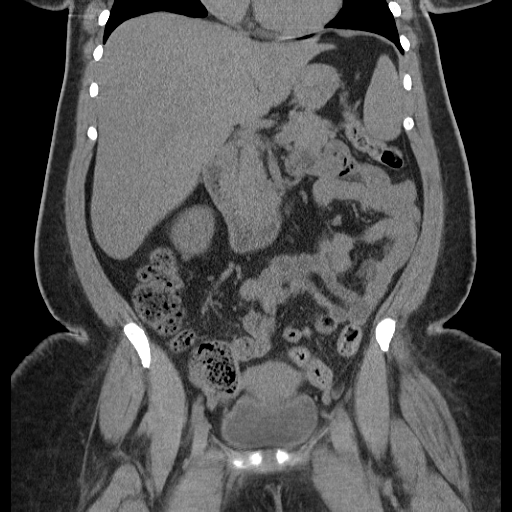
[im 49/89  soft-tissue]
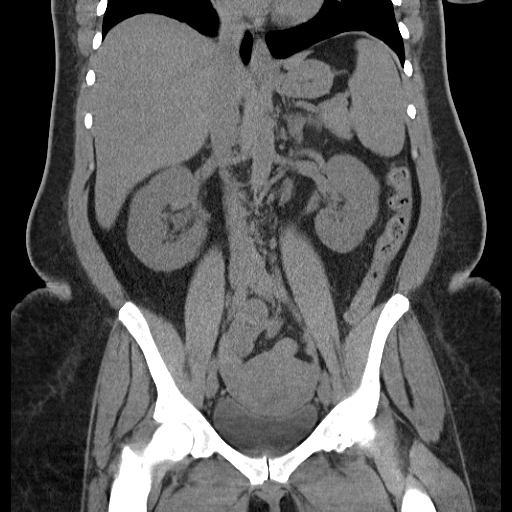

[17 of 46 positions shown; findings below may reference images not displayed]

FINDINGS: The lung bases are clear except for streaky dependent
bibasilar atelectasis.  The unenhanced appearance of the liver is
unremarkable.  No biliary dilatation.  The patient has had a
cholecystectomy.  The pancreas is grossly normal.  The spleen is
normal in size.  The adrenal glands and kidneys demonstrate no
significant abnormalities.  A left renal cyst is noted.  No renal
calculi or hydronephrosis.

The stomach, duodenum, small bowel and colon unremarkable but the
study is limited without oral contrast.  No mesenteric or
retroperitoneal masses or adenopathy.  There are small scattered
nodes.  The aorta is normal in caliber.

No significant bony findings.
IMPRESSION: 1.  No acute abdominal findings, mass lesions or adenopathy.
2.  No renal or obstructing ureteral calculi.
3.  Status post cholecystectomy.

CT PELVIS
FINDINGS: No distal ureteral or bladder calculi.  The uterus is
unremarkable.  There are bilateral ovarian cysts.  The appendix is
visualized and is normal.  The rectum, sigmoid colon and visualized
small bowel loops are grossly normal.  No pelvic mass, adenopathy
or free pelvic fluid collection.  The bony pelvis is intact.
IMPRESSION: 1.  No acute pelvic findings, mass lesions or adenopathy.
2.  No distal ureteral or bladder calculi.
3.  Normal appendix.
4.  Bilateral ovarian cysts.

## 2011-07-08 IMAGING — CR DG CHEST 2V
2 series · 2 of 2 positions shown · non-contrast
Comparison: Chest x-ray of 12/06/2008

CLINICAL DATA: Chest pain, shortness of breath

CHEST - 2 VIEW

[w chest pa *]
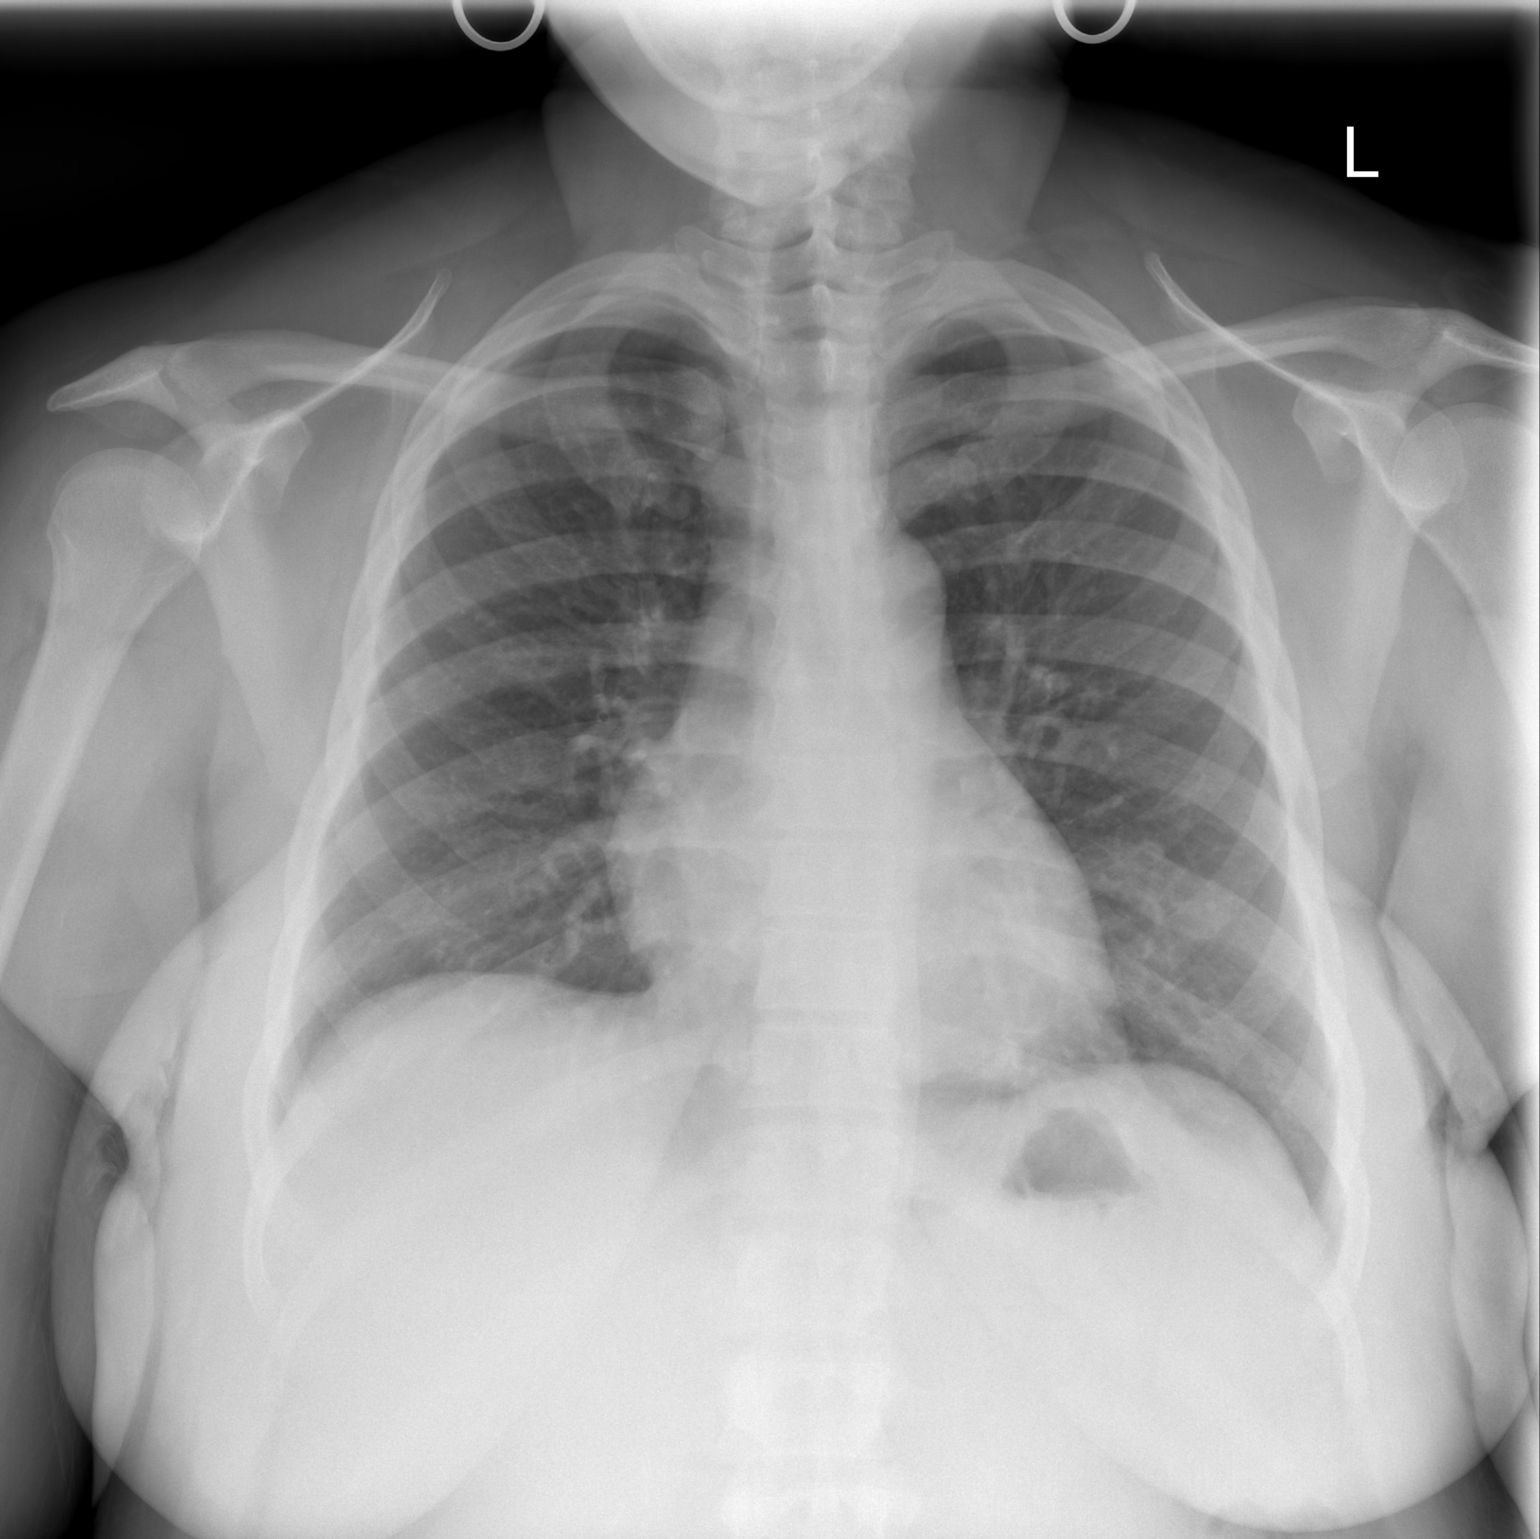

[w chest lat]
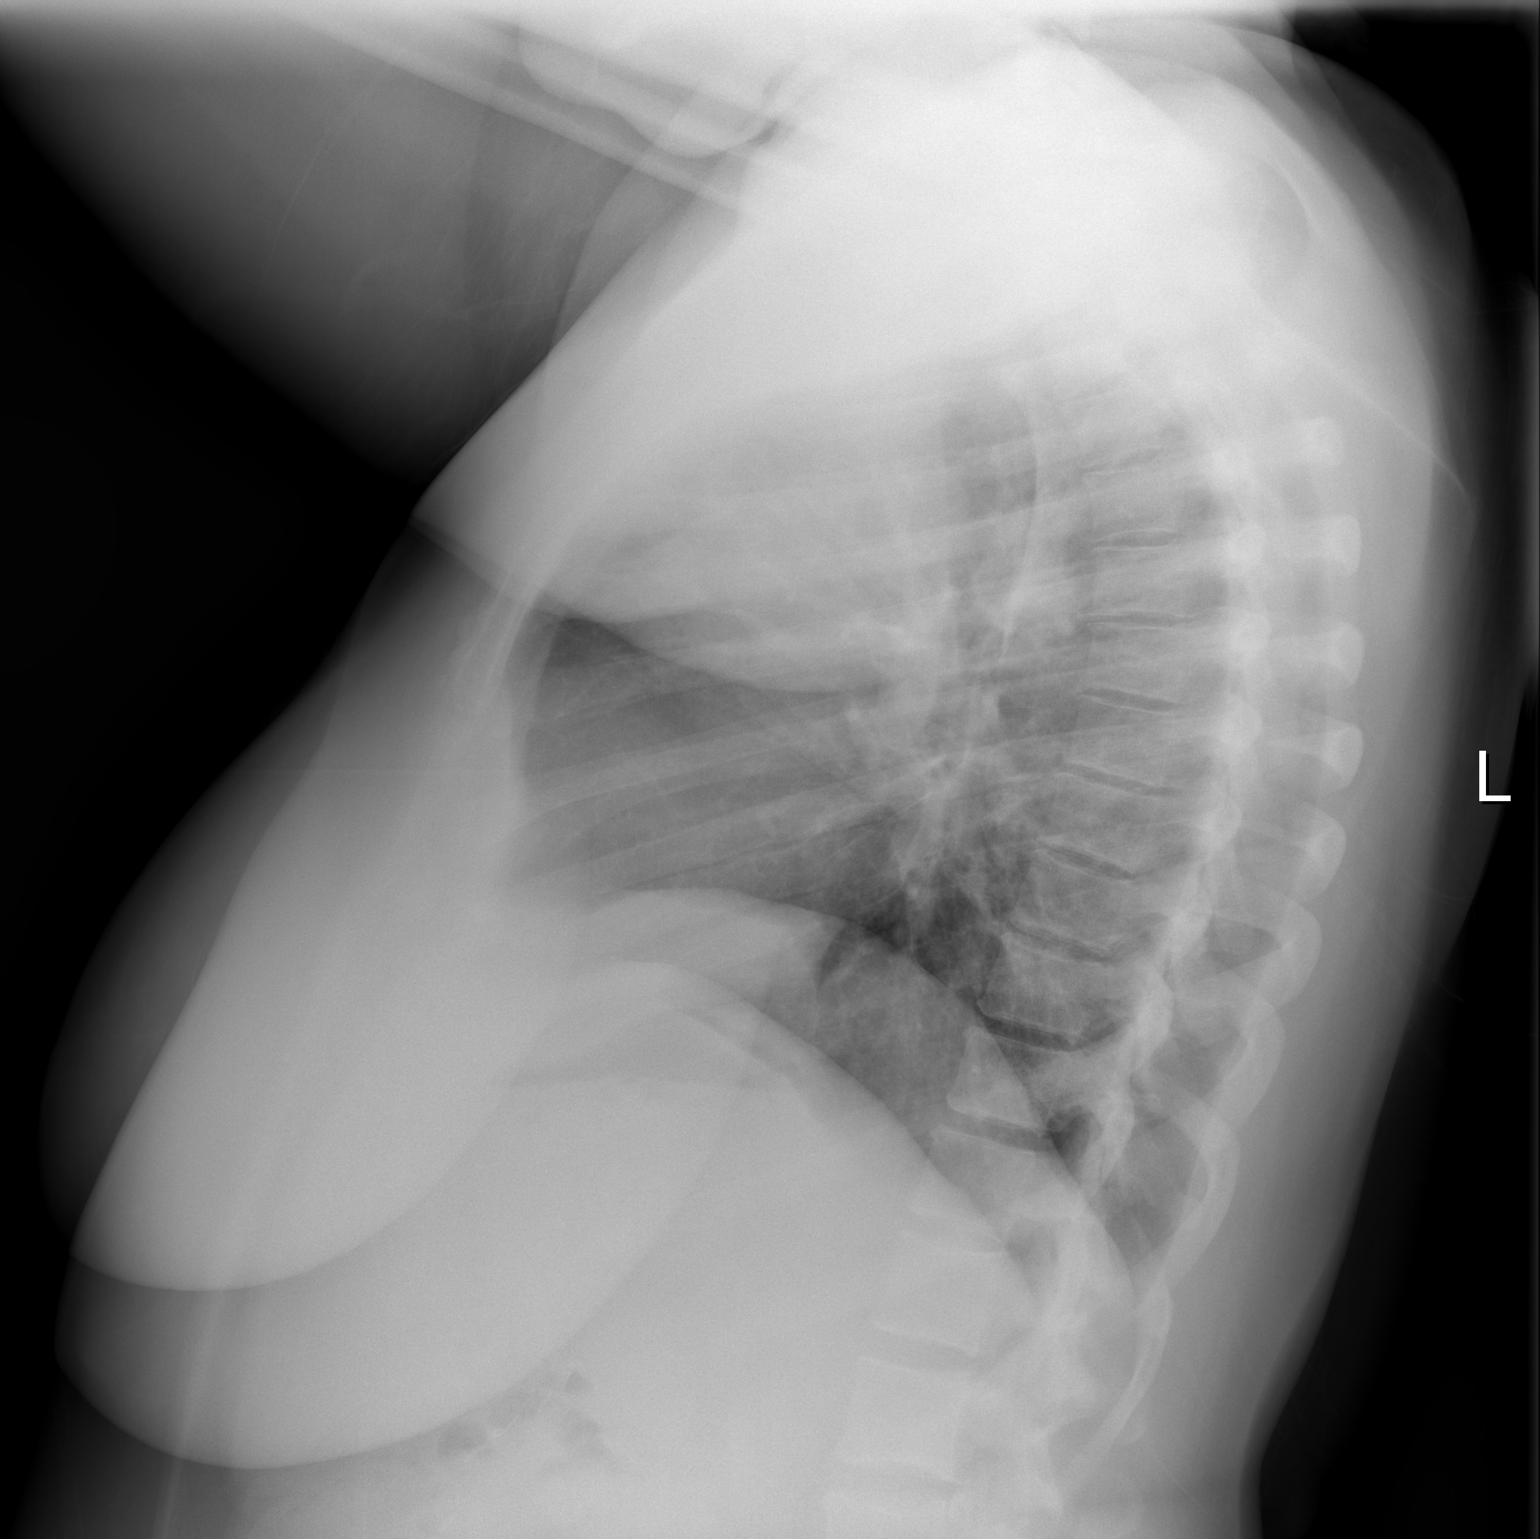

[2 of 2 positions shown; findings below may reference images not displayed]

FINDINGS: No active infiltrate or effusion is seen.  There is mild
peribronchial thickening which may indicate bronchitis.  The heart
is within normal limits in size.  No bony abnormality is seen.
IMPRESSION: No active lung disease.  Peribronchial thickening may indicate
bronchitis.

## 2011-07-14 DIAGNOSIS — K859 Acute pancreatitis without necrosis or infection, unspecified: Secondary | ICD-10-CM

## 2011-07-14 HISTORY — DX: Acute pancreatitis without necrosis or infection, unspecified: K85.90

## 2011-09-14 IMAGING — CR DG HIP COMPLETE 2+V*R*
3 series · 3 of 3 positions shown · non-contrast
Comparison: None

CLINICAL DATA: Right hip pain.

RIGHT HIP - COMPLETE 2+ VIEW

[t pelvis a.p.]
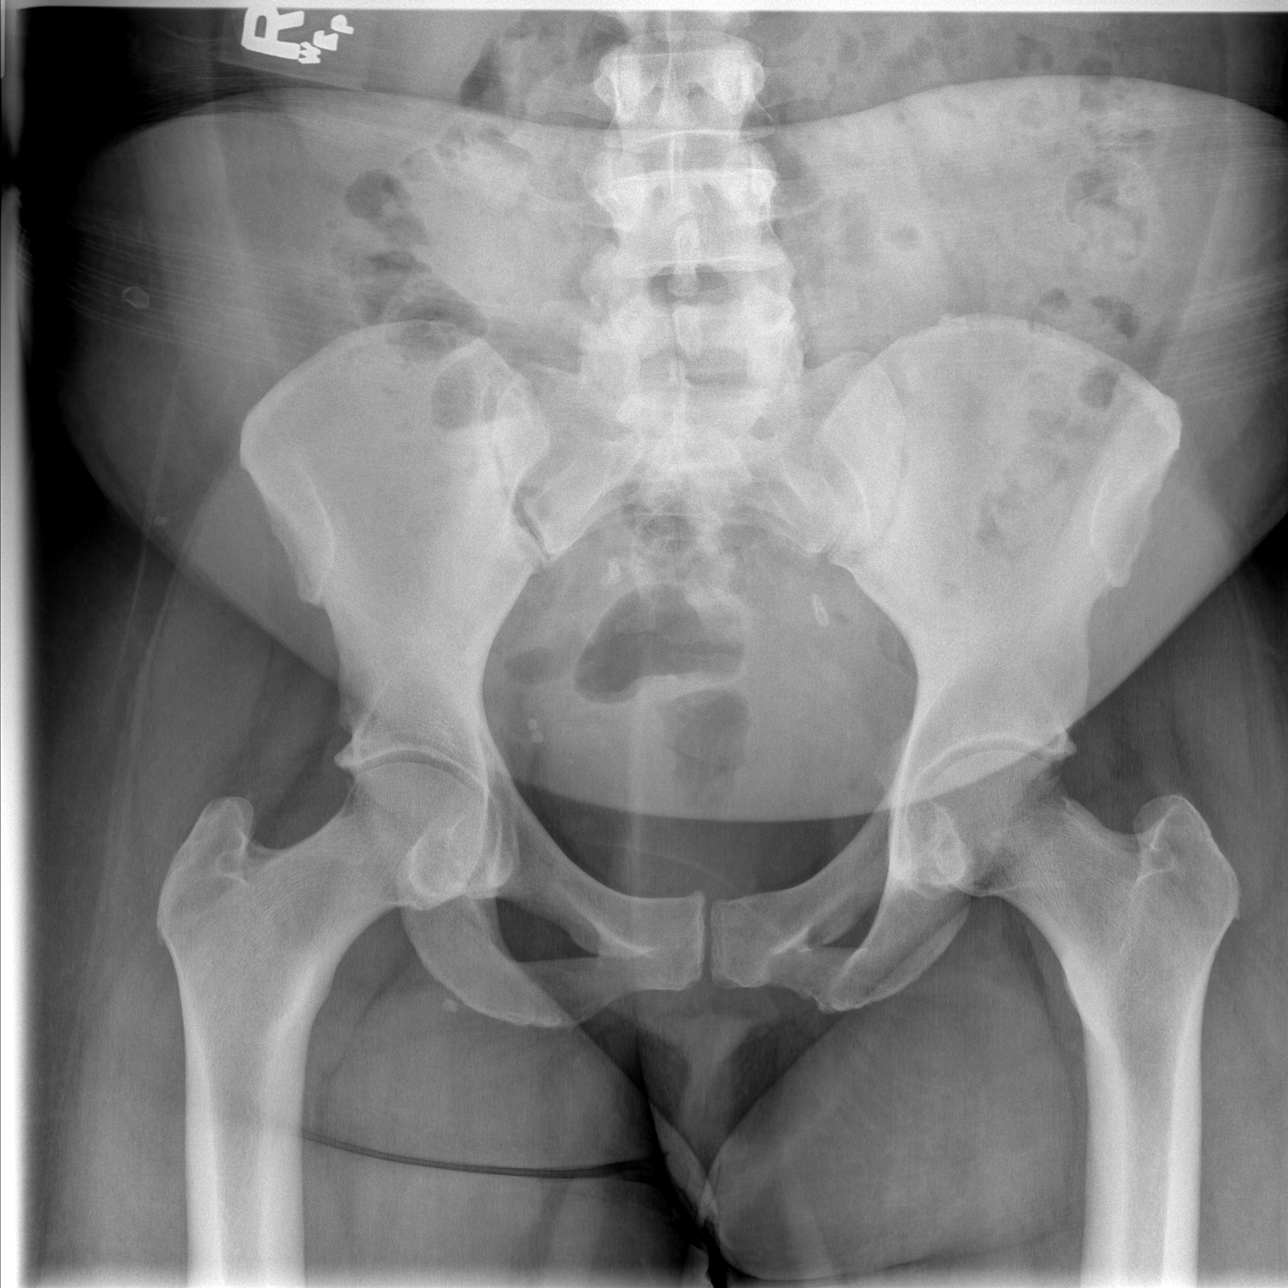

[t hip ap right]
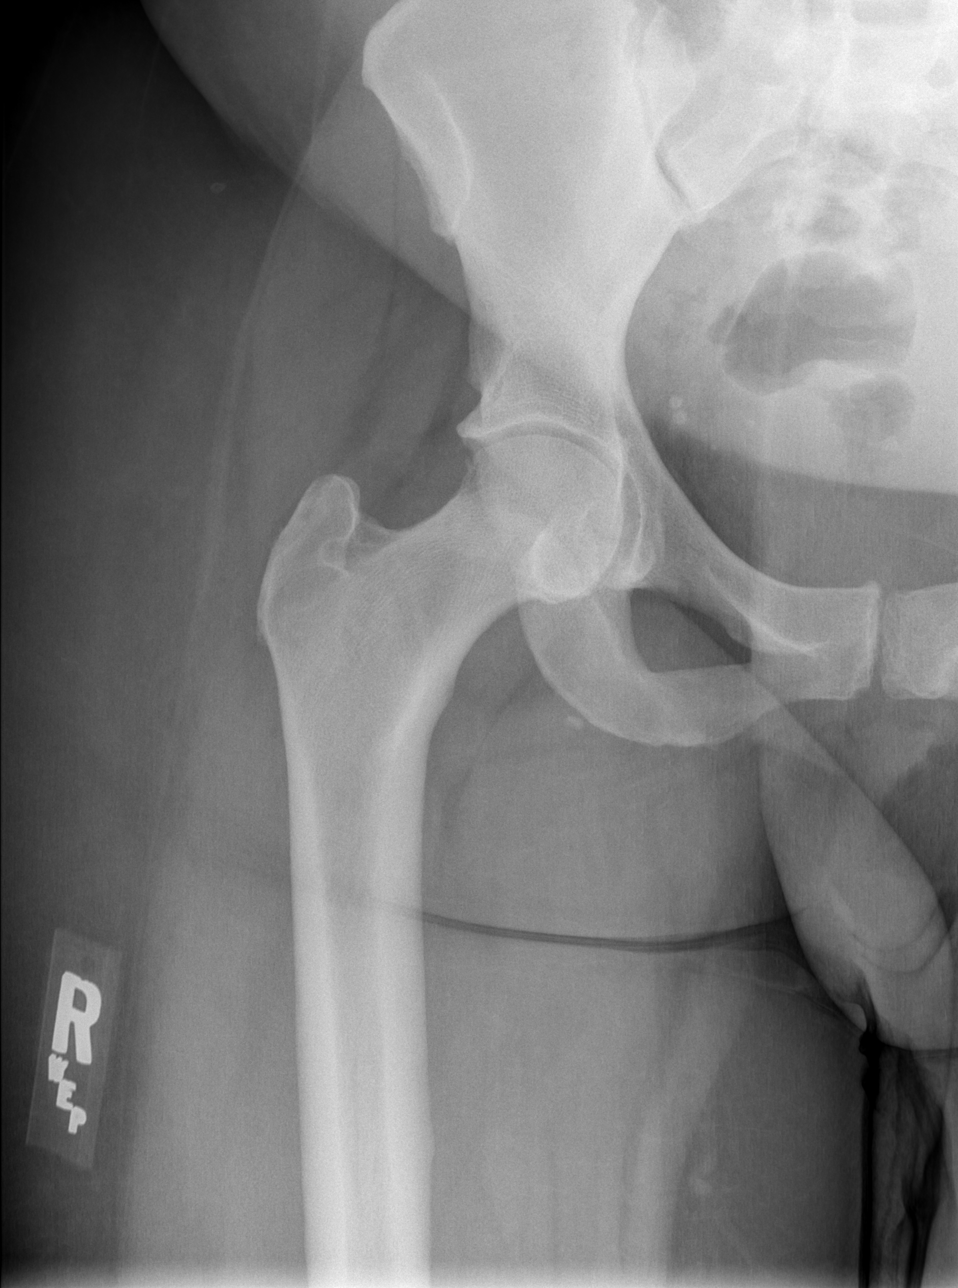

[t hip frog leg right]
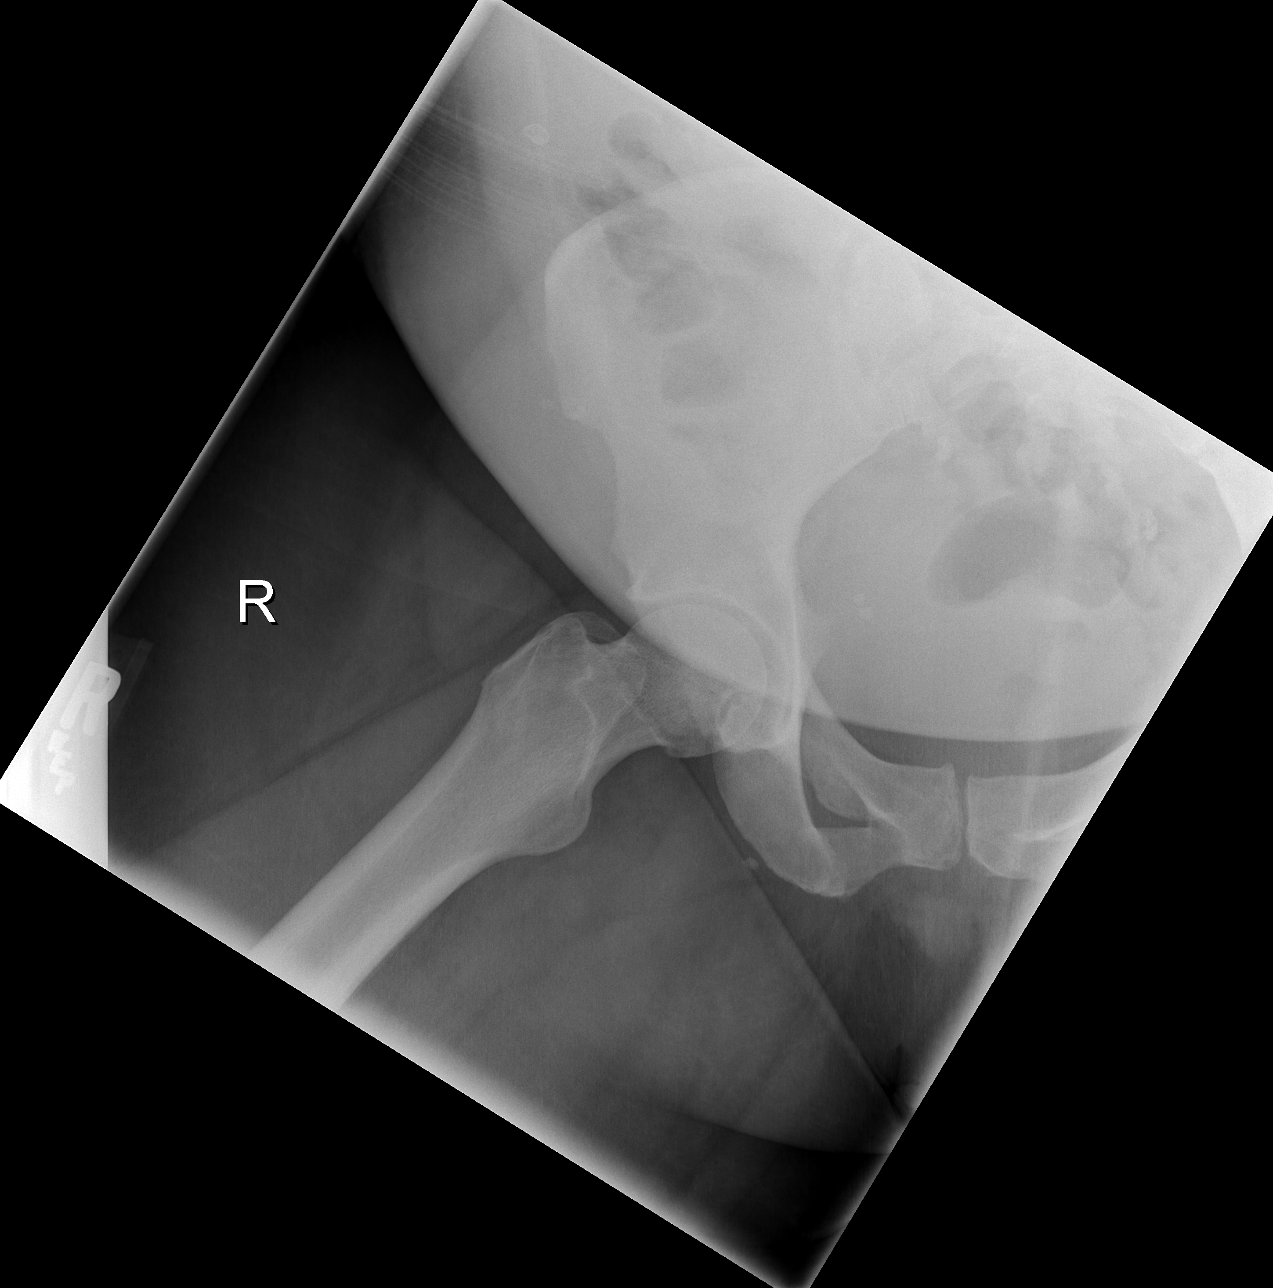

[3 of 3 positions shown; findings below may reference images not displayed]

FINDINGS: The hips are normally located.  No significant
degenerative changes or acute bony findings.  No plain film
evidence for avascular necrosis.  The pubic symphysis and SI joints
are intact.  The pubic rami appear normal.
IMPRESSION: 1.  No acute bony findings.
2.  Mild symmetric hip joint degenerative changes bilaterally.

## 2011-11-07 IMAGING — CR DG CHEST 2V
1 series · 1 of 1 positions shown · non-contrast
Comparison: 03/20/2009

CLINICAL DATA: Shortness of breath.  Chest pain, weakness.  History
of bronchial pneumonia.

CHEST - 2 VIEW

[w chest ap]
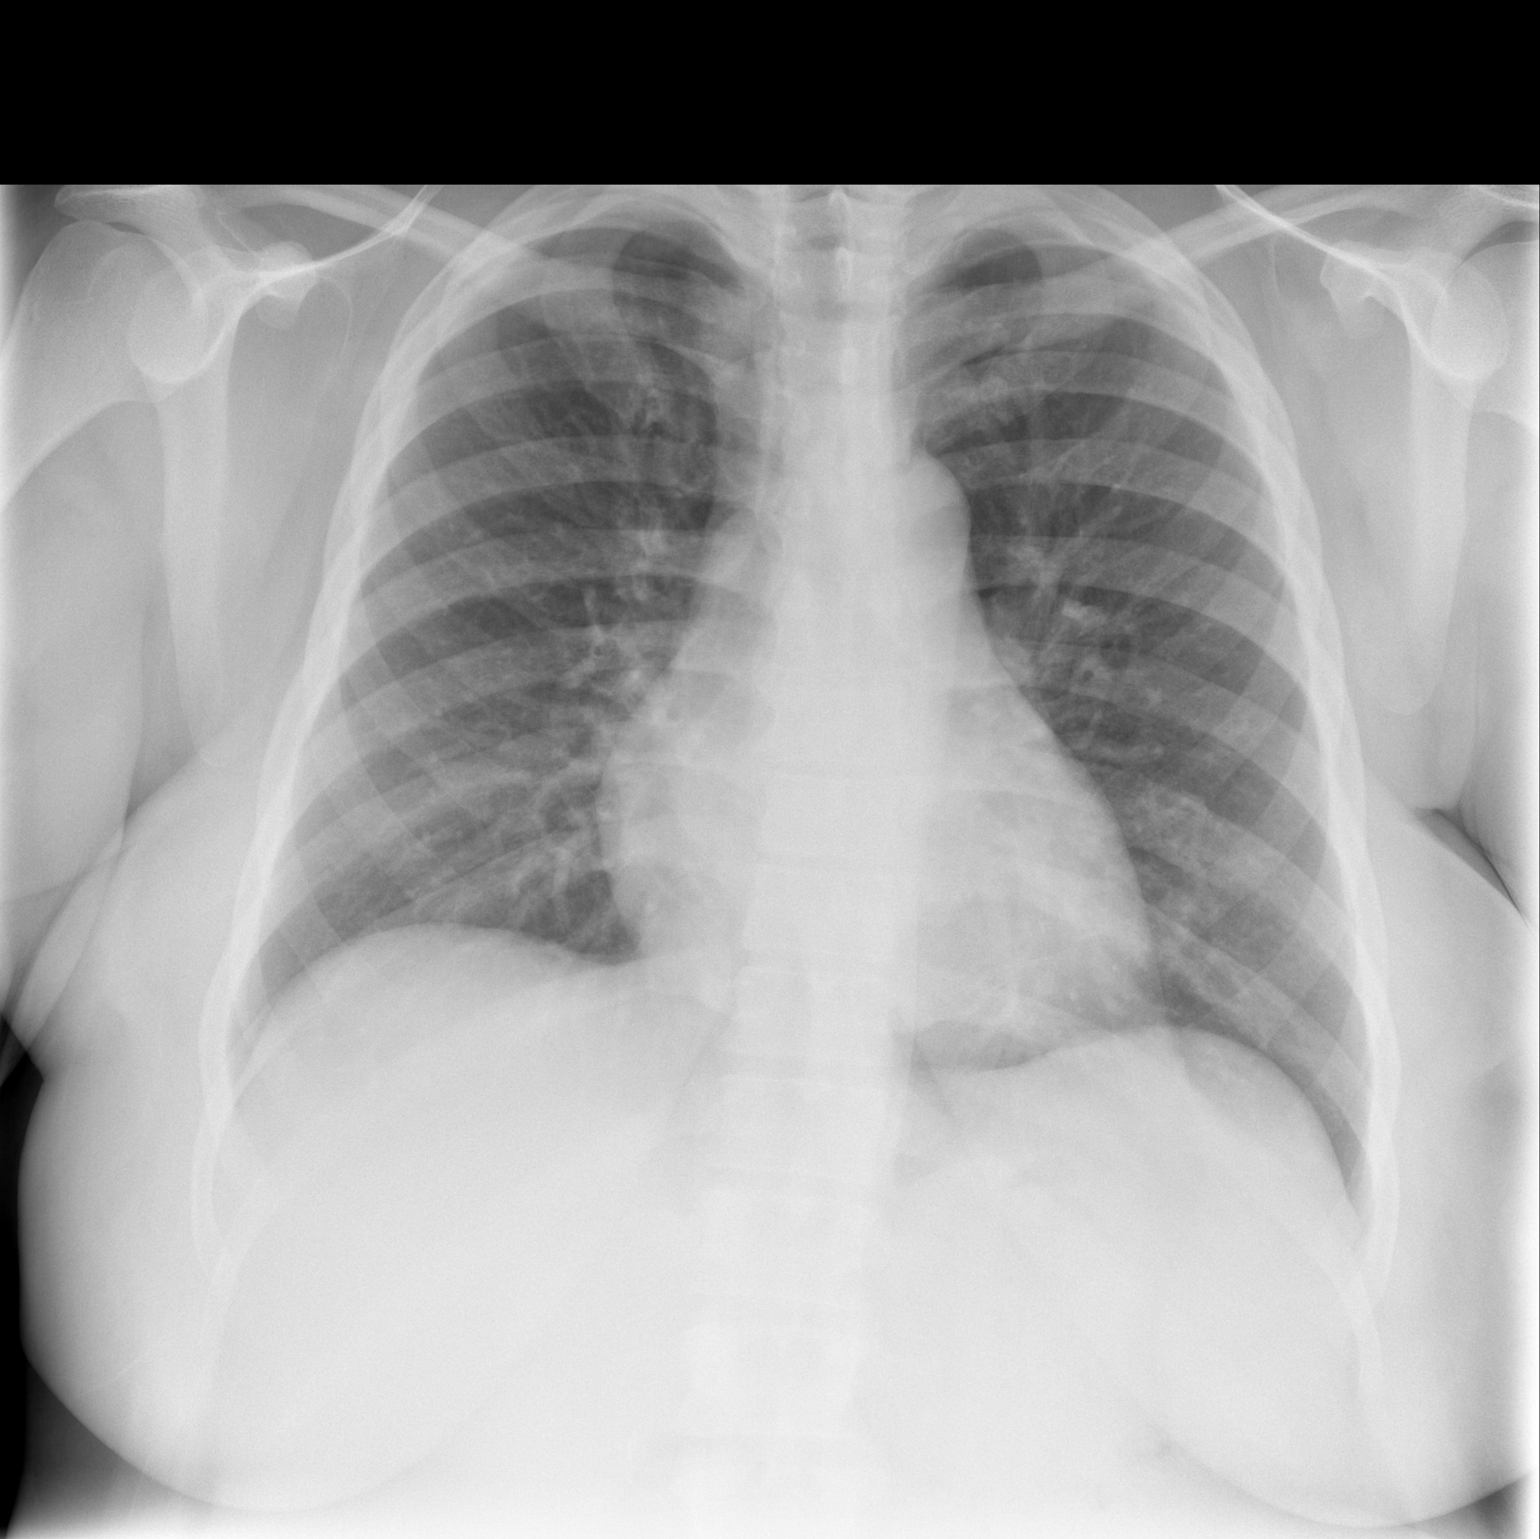

[1 of 1 positions shown; findings below may reference images not displayed]

FINDINGS: Heart size is normal.  There are no focal consolidations
or pleural effusions.  There is no evidence for pulmonary edema.
Visualized bony structures have a normal appearance.
IMPRESSION: No evidence for acute cardiopulmonary abnormality.

## 2011-12-04 IMAGING — CR DG HAND COMPLETE 3+V*L*
3 series · 3 of 3 positions shown · non-contrast
Comparison: None.

CLINICAL DATA: Finger pain.  No known injury but unable to
straighten the second digit with pain mid to proximal digit.
Swelling.  History of diabetes.

LEFT HAND - COMPLETE 3+ VIEW

[x hand pa left]
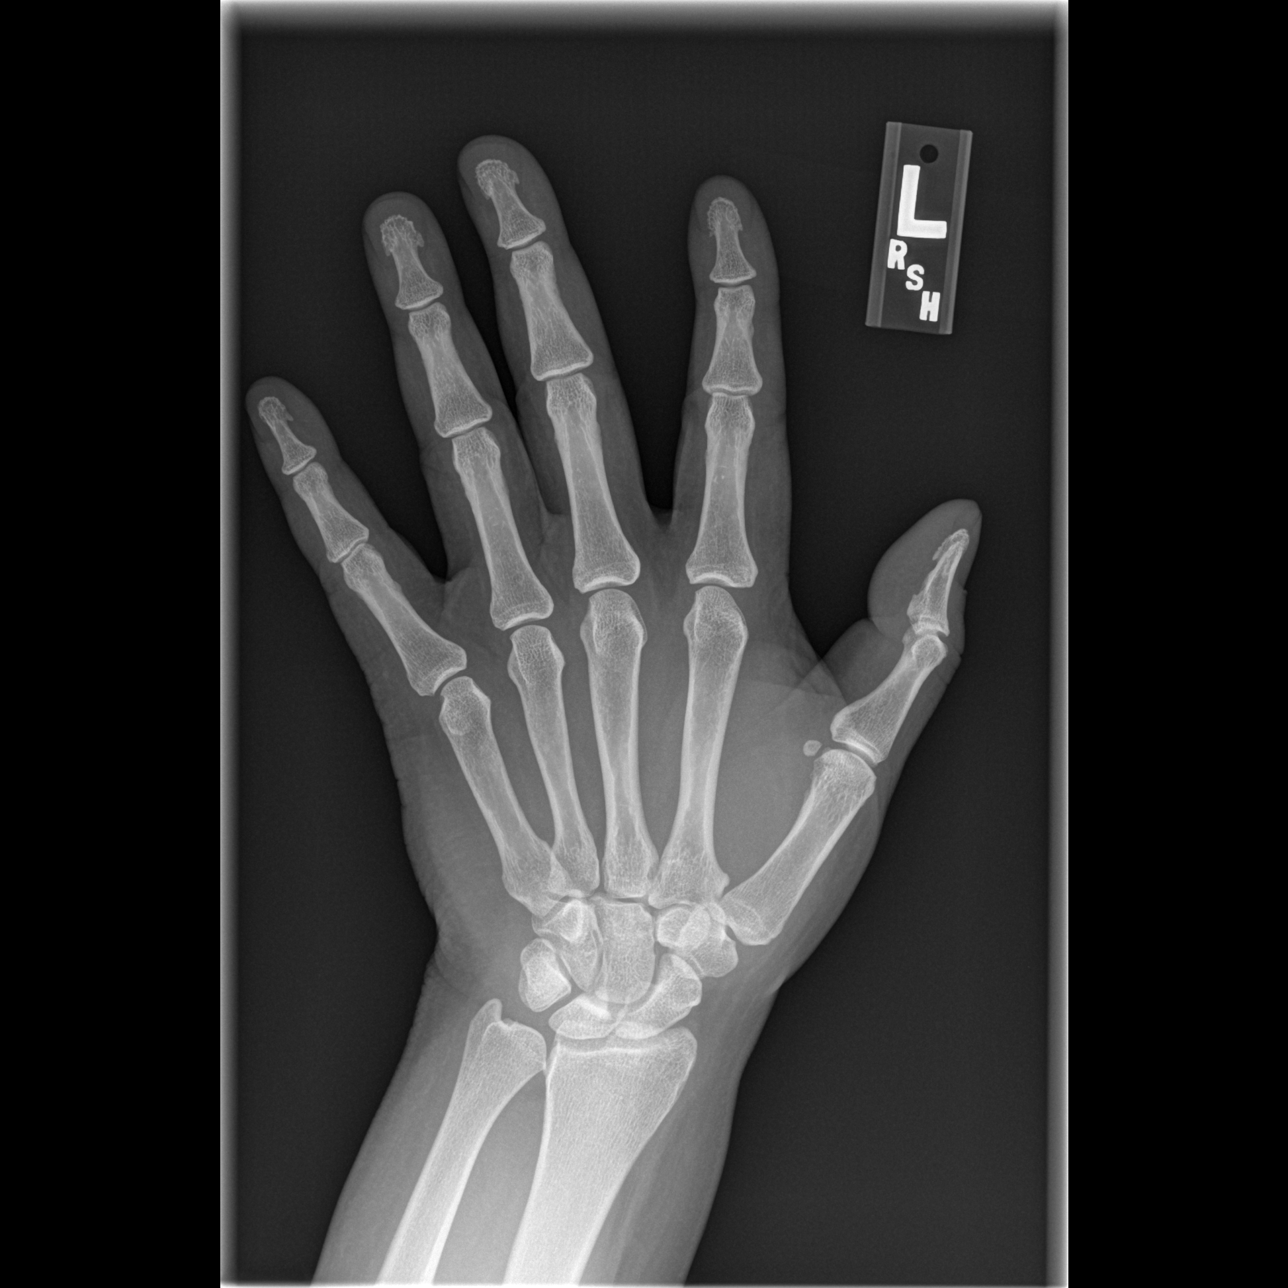

[x hand oblique left]
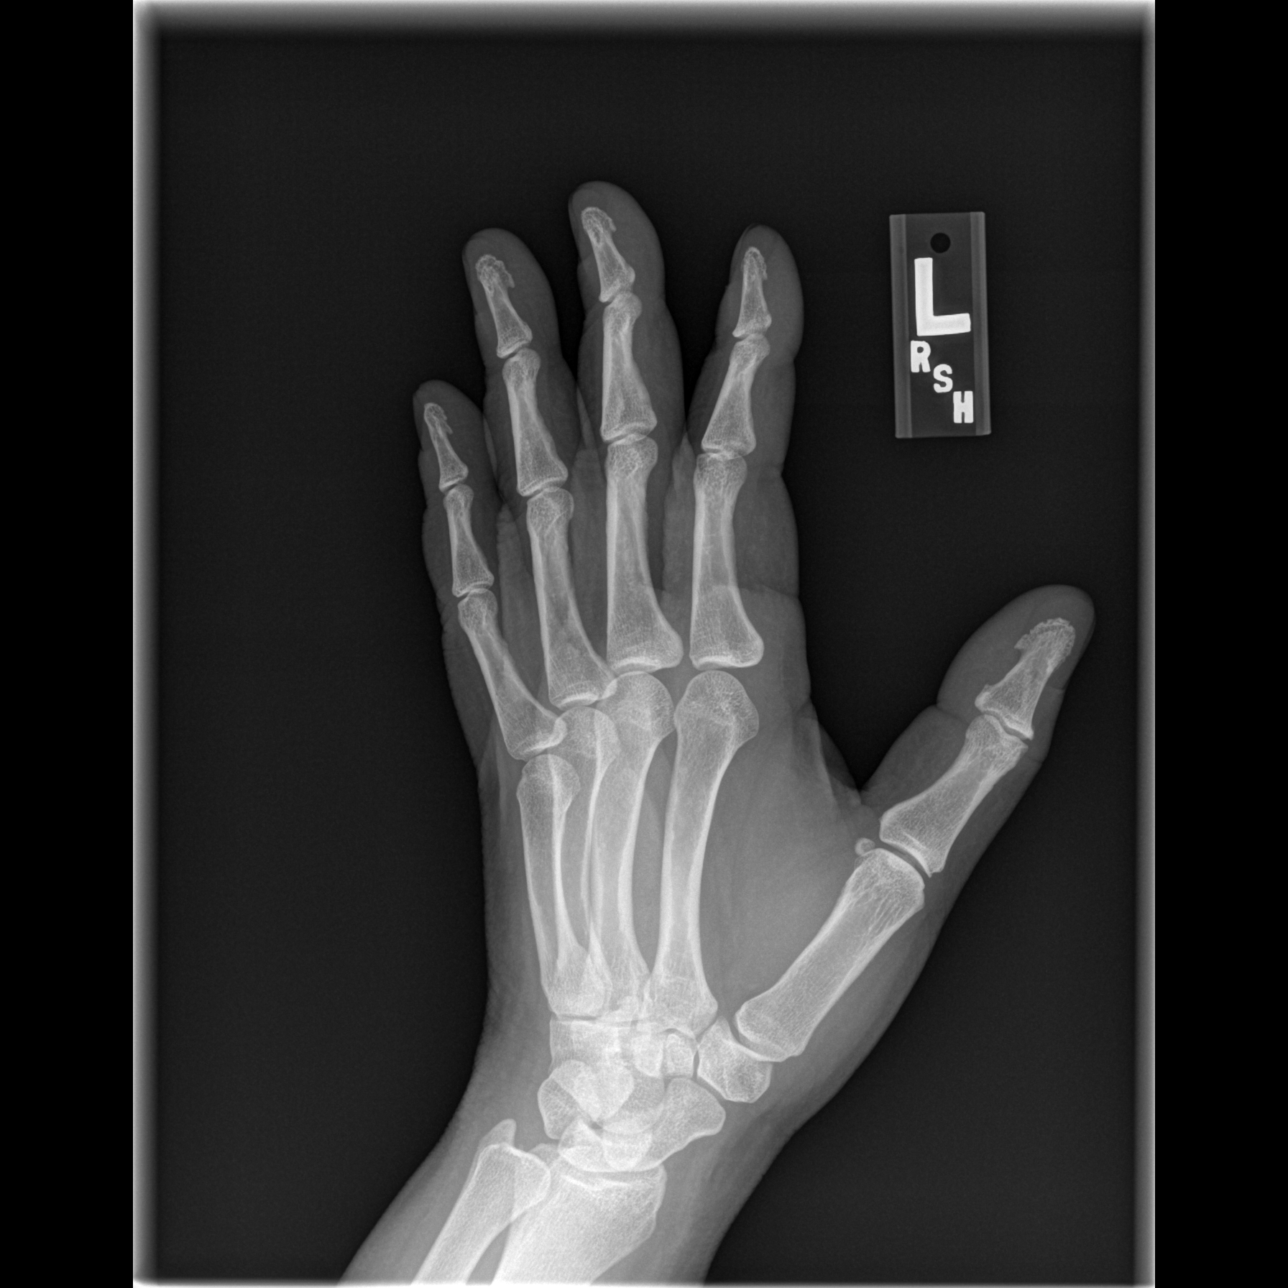

[x hand lat left]
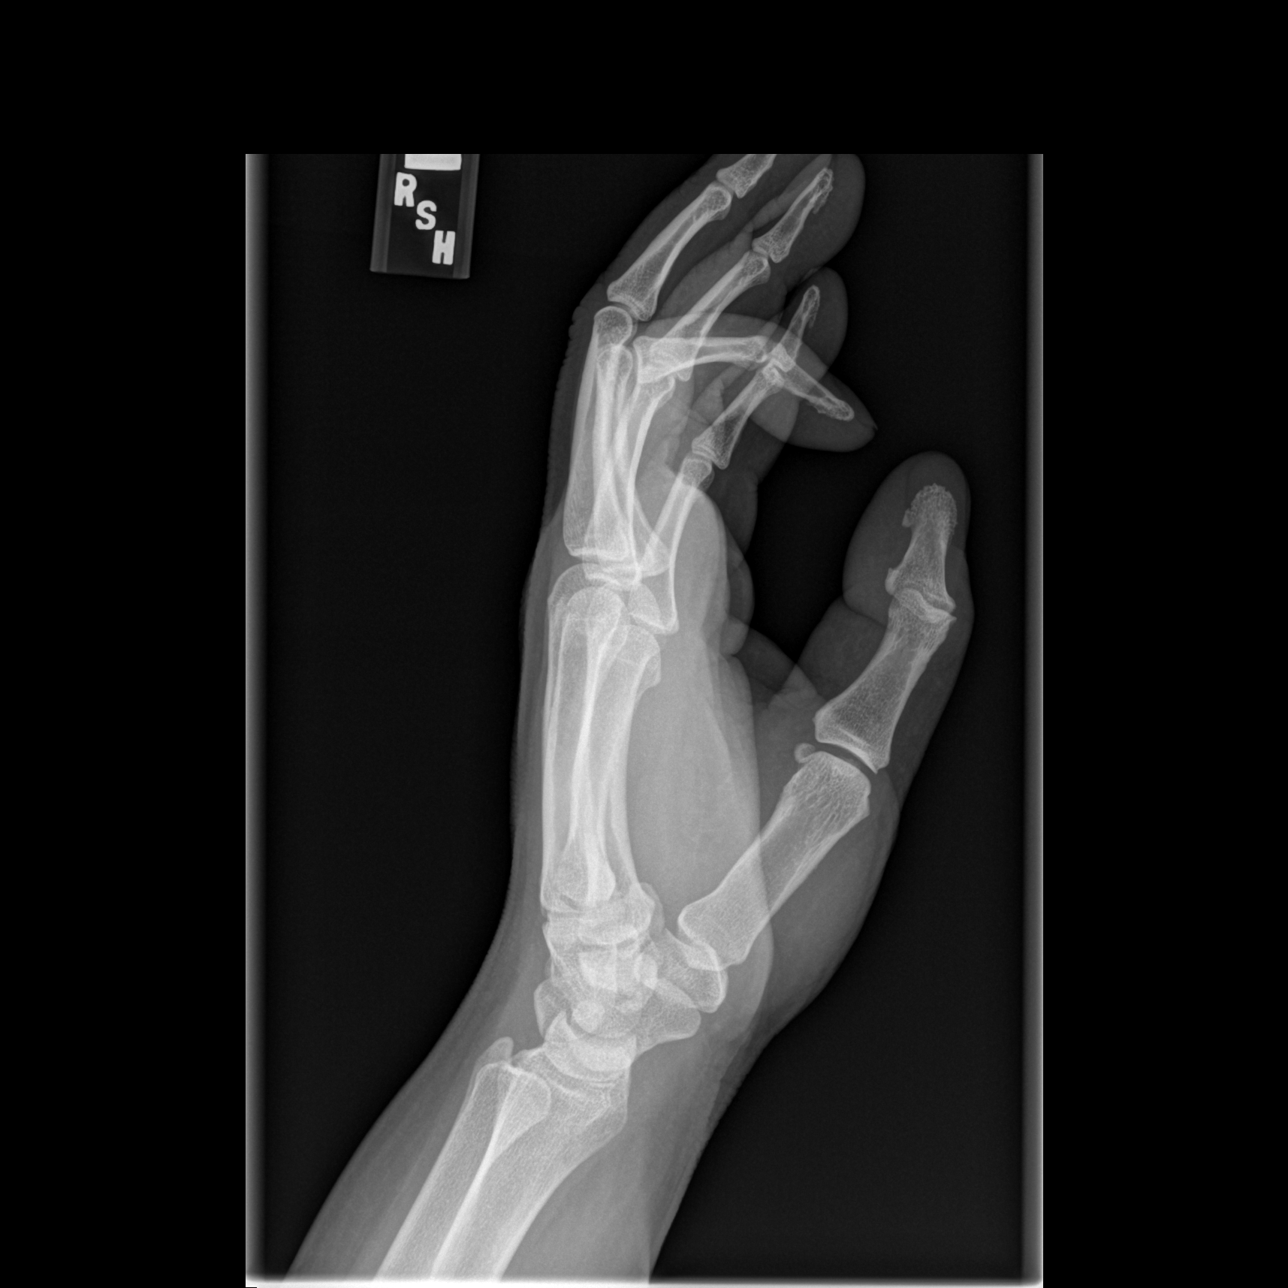

[3 of 3 positions shown; findings below may reference images not displayed]

FINDINGS: Bone density appears within normal limits.  No evidence
for acute fracture or dislocation is seen.  No erosive change is
seen.  No definite focal bony abnormalities are noted.  No focal
soft tissue abnormalities are seen.
IMPRESSION: No focal abnormality is seen to explain the patient's history of
finger pain.

## 2012-02-07 ENCOUNTER — Emergency Department (HOSPITAL_COMMUNITY): Payer: Self-pay

## 2012-02-07 ENCOUNTER — Emergency Department (HOSPITAL_COMMUNITY)
Admission: EM | Admit: 2012-02-07 | Discharge: 2012-02-07 | Disposition: A | Discharge status: Home / Self Care | Payer: Self-pay | Attending: Emergency Medicine | Admitting: Emergency Medicine

## 2012-02-07 ENCOUNTER — Encounter (HOSPITAL_COMMUNITY): Payer: Self-pay | Admitting: *Deleted

## 2012-02-07 DIAGNOSIS — I10 Essential (primary) hypertension: Secondary | ICD-10-CM | POA: Insufficient documentation

## 2012-02-07 DIAGNOSIS — Z7982 Long term (current) use of aspirin: Secondary | ICD-10-CM | POA: Insufficient documentation

## 2012-02-07 DIAGNOSIS — Z79899 Other long term (current) drug therapy: Secondary | ICD-10-CM | POA: Insufficient documentation

## 2012-02-07 DIAGNOSIS — I509 Heart failure, unspecified: Secondary | ICD-10-CM | POA: Insufficient documentation

## 2012-02-07 DIAGNOSIS — E119 Type 2 diabetes mellitus without complications: Secondary | ICD-10-CM | POA: Insufficient documentation

## 2012-02-07 DIAGNOSIS — J40 Bronchitis, not specified as acute or chronic: Secondary | ICD-10-CM | POA: Insufficient documentation

## 2012-02-07 HISTORY — DX: Unspecified viral hepatitis C without hepatic coma: B19.20

## 2012-02-07 HISTORY — DX: Bronchitis, not specified as acute or chronic: J40

## 2012-02-07 HISTORY — DX: Essential (primary) hypertension: I10

## 2012-02-07 HISTORY — DX: Reserved for concepts with insufficient information to code with codable children: IMO0002

## 2012-02-07 LAB — COMPREHENSIVE METABOLIC PANEL
ALT: 16 U/L (ref 0–35)
AST: 19 U/L (ref 0–37)
Albumin: 3.2 g/dL — ABNORMAL LOW (ref 3.5–5.2)
Alkaline Phosphatase: 93 U/L (ref 39–117)
BUN: 19 mg/dL (ref 6–23)
CO2: 24 mEq/L (ref 19–32)
Calcium: 8.9 mg/dL (ref 8.4–10.5)
Chloride: 103 mEq/L (ref 96–112)
Creatinine, Ser: 0.91 mg/dL (ref 0.50–1.10)
GFR calc Af Amer: 82 mL/min — ABNORMAL LOW (ref >90)
GFR calc non Af Amer: 71 mL/min — ABNORMAL LOW (ref >90)
Glucose, Bld: 73 mg/dL (ref 70–99)
Potassium: 3.7 mEq/L (ref 3.5–5.1)
Sodium: 139 mEq/L (ref 135–145)
Total Bilirubin: 0.3 mg/dL (ref 0.3–1.2)
Total Protein: 7.2 g/dL (ref 6.0–8.3)

## 2012-02-07 LAB — TROPONIN I: Troponin I: <0.3 ng/mL (ref <0.30)

## 2012-02-07 LAB — CBC WITH DIFFERENTIAL/PLATELET
Basophils Absolute: 0 10*3/uL (ref 0.0–0.1)
Basophils Relative: 0 % (ref 0–1)
Eosinophils Absolute: 0.5 10*3/uL (ref 0.0–0.7)
Eosinophils Relative: 6 % — ABNORMAL HIGH (ref 0–5)
HCT: 33.4 % — ABNORMAL LOW (ref 36.0–46.0)
Hemoglobin: 11 g/dL — ABNORMAL LOW (ref 12.0–15.0)
Lymphocytes Relative: 38 % (ref 12–46)
Lymphs Abs: 3.1 10*3/uL (ref 0.7–4.0)
MCH: 27.7 pg (ref 26.0–34.0)
MCHC: 32.9 g/dL (ref 30.0–36.0)
MCV: 84.1 fL (ref 78.0–100.0)
Monocytes Absolute: 0.6 10*3/uL (ref 0.1–1.0)
Monocytes Relative: 7 % (ref 3–12)
Neutro Abs: 3.9 10*3/uL (ref 1.7–7.7)
Neutrophils Relative %: 49 % (ref 43–77)
Platelets: 249 10*3/uL (ref 150–400)
RBC: 3.97 MIL/uL (ref 3.87–5.11)
RDW: 14.5 % (ref 11.5–15.5)
WBC: 8.1 10*3/uL (ref 4.0–10.5)

## 2012-02-07 LAB — PRO B NATRIURETIC PEPTIDE: Pro B Natriuretic peptide (BNP): 83 pg/mL (ref 0–125)

## 2012-02-07 MED ORDER — BENZONATATE 100 MG PO CAPS: 100.0000 mg | ORAL_CAPSULE | Freq: Three times a day (TID) | ORAL | Status: AC

## 2012-02-07 MED ORDER — IPRATROPIUM BROMIDE 0.02 % IN SOLN
0.5000 mg | RESPIRATORY_TRACT | Status: AC
  Administered 2012-02-07: 0.5 mg via RESPIRATORY_TRACT
  Filled 2012-02-07: qty 2.5

## 2012-02-07 MED ORDER — ALBUTEROL SULFATE (5 MG/ML) 0.5% IN NEBU
2.5000 mg | INHALATION_SOLUTION | Freq: Once | RESPIRATORY_TRACT | Status: AC
  Administered 2012-02-07: 2.5 mg via RESPIRATORY_TRACT

## 2012-02-07 MED ORDER — ALBUTEROL SULFATE (5 MG/ML) 0.5% IN NEBU
5.0000 mg | INHALATION_SOLUTION | Freq: Once | RESPIRATORY_TRACT | Status: AC
  Administered 2012-02-07: 5 mg via RESPIRATORY_TRACT
  Filled 2012-02-07: qty 1

## 2012-02-07 MED ORDER — AZITHROMYCIN 250 MG PO TABS: 250.0000 mg | ORAL_TABLET | Freq: Every day | ORAL | Status: AC

## 2012-02-07 MED ORDER — ALBUTEROL SULFATE HFA 108 (90 BASE) MCG/ACT IN AERS
2.0000 | INHALATION_SPRAY | Freq: Four times a day (QID) | RESPIRATORY_TRACT | Status: DC
  Administered 2012-02-07: 2 via RESPIRATORY_TRACT

## 2012-02-07 MED ORDER — PREDNISONE 10 MG PO TABS: ORAL_TABLET | ORAL | Status: DC

## 2012-02-07 MED ORDER — ALBUTEROL SULFATE (5 MG/ML) 0.5% IN NEBU
INHALATION_SOLUTION | RESPIRATORY_TRACT | Status: AC
  Administered 2012-02-07: 2.5 mg
  Filled 2012-02-07: qty 1

## 2012-02-07 NOTE — ED Provider Notes (Addendum)
History     CSN: [id number]  Arrival date & time 02/07/12  1507   First MD Initiated Contact with Patient 02/07/12 1719      Chief Complaint  Patient presents with  . Shortness of Breath  . Cough    (Consider location/radiation/quality/duration/timing/severity/associated sxs/prior treatment) HPI Comments: Pt presents with mild dyspnea which started on Wednesday and has gradually worsened. Worsens with activity and lying flat. Pt has also had productive cough of yellow mucus. Denies chest pain, nausea, vomiting, palpitations, PND, abd pain with this. No fever or chills. No known sick contacts. She is a former smoker and is exposed to secondhand smoke at home.  Pt states that she was told that she may have CHF when she was living in [place]; she is currently on HCTZ but takes no other diuretics. She has not noted increased leg swelling.  Patient has no prior history of DVT/PE. Denies recent trauma, surgery, or prolonged immobilization. Denies hemoptysis, leg swelling, or use of exogenous estrogen.  Patient is a 56 y.o. female presenting with shortness of breath. The history is provided by the patient.  Shortness of Breath  The current episode started 3 to 5 days ago. The onset was gradual. The problem occurs continuously. The problem has been gradually worsening. The problem is mild. Nothing relieves the symptoms. Nothing aggravates the symptoms. Associated symptoms include rhinorrhea, cough and shortness of breath. Pertinent negatives include no chest pain, no chest pressure, no orthopnea, no fever, no sore throat, no stridor and no wheezing. The cough is productive and supine. She was not exposed to toxic fumes. She has had prior hospitalizations. She has had no prior ICU admissions. She has had no prior intubations. Her past medical history does not include asthma or past wheezing.    Past Medical History  Diagnosis Date  . CHF (congestive heart failure)   . Bronchitis   . Diabetes  mellitus   . Hypertension   . Ulcer   . Hepatitis C     Past Surgical History  Procedure Date  . Tubal ligation   . Cholecystectomy     No family history on file.  History  Substance Use Topics  . Smoking status: Former Research scientist (life sciences)  . Smokeless tobacco: Not on file  . Alcohol Use: Yes     ocassionally    OB History    Grav Para Term Preterm Abortions TAB SAB Ect Mult Living                  Review of Systems  Constitutional: Negative for fever.  HENT: Positive for rhinorrhea. Negative for sore throat.   Respiratory: Positive for cough and shortness of breath. Negative for wheezing and stridor.   Cardiovascular: Negative for chest pain and orthopnea.  10 point ROS reviewed, negative except as indicated in HPI  Allergies  Review of patient's allergies indicates no known allergies.  Home Medications   Current Outpatient Rx  Name Route Sig Dispense Refill  . ASPIRIN 325 MG PO TBEC Oral Take 325 mg by mouth daily.    [name] CARVEDILOL 12.5 MG PO TABS Oral Take 12.5 mg by mouth 2 (two) times daily with a meal.    . DOXAZOSIN MESYLATE 2 MG PO TABS Oral Take 2 mg by mouth daily.    [name] HYDROCHLOROTHIAZIDE 25 MG PO TABS Oral Take 25 mg by mouth daily.    . INSULIN ISOPHANE & REGULAR (70-30) 100 UNIT/ML [facility] SUSP Subcutaneous Inject 60 Units into the skin 2 (two)  times daily.    [name] METFORMIN HCL 1000 MG PO TABS Oral Take 1,000 mg by mouth 2 (two) times daily with a meal.    . RANITIDINE HCL 300 MG PO CAPS Oral Take 300 mg by mouth every evening.      BP 142/78  Pulse 64  Temp 98.7 F (37.1 C) (Oral)  Resp 16  Wt 278 lb (126.1 kg)  SpO2 98%  Physical Exam  Nursing note and vitals reviewed. Constitutional: She appears well-developed and well-nourished. No distress.  HENT:  Head: Normocephalic and atraumatic.  Mouth/Throat: Oropharynx is clear and moist. No oropharyngeal exudate.  Eyes:       Normal appearance  Neck: Normal range of motion. Neck supple.  Cardiovascular: Normal  rate, regular rhythm and normal heart sounds.  Exam reveals no gallop and no friction rub.   No murmur heard. Pulmonary/Chest: Effort normal. No respiratory distress. She has no wheezes. She exhibits no tenderness.       Mildly coarse breath sounds  Abdominal: Soft. Bowel sounds are normal. There is no tenderness. There is no rebound and no guarding.  Musculoskeletal: Normal range of motion. She exhibits no edema.       No LE edema, pulses 2+  Lymphadenopathy:    She has no cervical adenopathy.  Neurological: She is alert.  Skin: Skin is warm and dry. She is not diaphoretic.  Psychiatric: She has a normal mood and affect.    ED Course  Procedures (including critical care time)  Labs Reviewed  CBC WITH DIFFERENTIAL - Abnormal; Notable for the following:    Hemoglobin 11.0 (*)     HCT 33.4 (*)     Eosinophils Relative 6 (*)     All other components within normal limits  COMPREHENSIVE METABOLIC PANEL - Abnormal; Notable for the following:    Albumin 3.2 (*)     GFR calc non Af Amer 71 (*)     GFR calc Af Amer 82 (*)     All other components within normal limits  PRO B NATRIURETIC PEPTIDE  TROPONIN I   Dg Chest Port 1 View  02/07/2012  *RADIOLOGY REPORT*  Clinical Data: Shortness of breath.  PORTABLE CHEST - 1 VIEW  Comparison: 09/02/2010.  Findings: The cardiac silhouette, mediastinal and hilar contours are within normal limits and stable.  The lungs are clear.  No pleural effusion.  The bony thorax is intact.  IMPRESSION: No acute cardiopulmonary findings.  Original Report Authenticated By: P. Kalman Jewels, M.D.     1. Bronchitis       MDM  Pt with cough productive of yellow sputum x 3-4 days. Reports hx CHF, no known dx of COPD. Pt with coarse breath sounds on exam, improved with neb. Trop, BNP, CBC, CMP with no worrisome abnormalities. CXR reviewed by myself and without acute findings. Likely bronchitis. Will tx symptomatically. Pt given strict return precautions should sx  worsen or change.  Abran Richard, PA-C 02/09/12 2329  Addendum (ECG interpretation)  Date: 02/07/2012  Rate: 59  Rhythm: normal sinus rhythm  QRS Axis: normal  Intervals: borderline short PR  ST/T Wave abnormalities: nonspecific T wave changes  Conduction Disutrbances:none  Narrative Interpretation:   Old EKG Reviewed: none available  Abran Richard, PA-C 02/27/12 1504

## 2012-02-07 NOTE — ED Notes (Signed)
Pt states "I started feeling short of breath last Wednesday, have had a cough that cough up thick yellow mucus"

## 2012-02-07 NOTE — ED Notes (Signed)
RN to obtain labs with start of IV 

## 2012-02-10 NOTE — ED Provider Notes (Signed)
Medical screening examination/treatment/procedure(s) were performed by non-physician practitioner and as supervising physician I was immediately available for consultation/collaboration.  Varney Biles, MD 02/10/12 [phone]

## 2012-02-27 NOTE — ED Provider Notes (Signed)
Medical screening examination/treatment/procedure(s) were performed by non-physician practitioner and as supervising physician I was immediately available for consultation/collaboration.  Varney Biles, MD 02/27/12 1510

## 2012-03-24 ENCOUNTER — Inpatient Hospital Stay (HOSPITAL_COMMUNITY)
Admission: EM | Admit: 2012-03-24 | Discharge: 2012-04-03 | DRG: 439 | Disposition: A | Discharge status: Home / Self Care | Payer: MEDICAID | Attending: Family Medicine | Admitting: Family Medicine

## 2012-03-24 ENCOUNTER — Encounter (HOSPITAL_COMMUNITY): Payer: Self-pay | Admitting: *Deleted

## 2012-03-24 ENCOUNTER — Emergency Department (HOSPITAL_COMMUNITY): Payer: Self-pay

## 2012-03-24 DIAGNOSIS — R109 Unspecified abdominal pain: Secondary | ICD-10-CM

## 2012-03-24 DIAGNOSIS — I10 Essential (primary) hypertension: Secondary | ICD-10-CM | POA: Diagnosis present

## 2012-03-24 DIAGNOSIS — B1920 Unspecified viral hepatitis C without hepatic coma: Secondary | ICD-10-CM | POA: Diagnosis present

## 2012-03-24 DIAGNOSIS — E1165 Type 2 diabetes mellitus with hyperglycemia: Secondary | ICD-10-CM | POA: Diagnosis present

## 2012-03-24 DIAGNOSIS — K859 Acute pancreatitis without necrosis or infection, unspecified: Principal | ICD-10-CM | POA: Diagnosis present

## 2012-03-24 DIAGNOSIS — H109 Unspecified conjunctivitis: Secondary | ICD-10-CM

## 2012-03-24 DIAGNOSIS — N183 Chronic kidney disease, stage 3 unspecified: Secondary | ICD-10-CM | POA: Diagnosis present

## 2012-03-24 DIAGNOSIS — D649 Anemia, unspecified: Secondary | ICD-10-CM | POA: Diagnosis present

## 2012-03-24 DIAGNOSIS — E669 Obesity, unspecified: Secondary | ICD-10-CM | POA: Diagnosis present

## 2012-03-24 DIAGNOSIS — E1122 Type 2 diabetes mellitus with diabetic chronic kidney disease: Secondary | ICD-10-CM | POA: Diagnosis present

## 2012-03-24 DIAGNOSIS — E119 Type 2 diabetes mellitus without complications: Secondary | ICD-10-CM | POA: Diagnosis present

## 2012-03-24 DIAGNOSIS — E1100 Type 2 diabetes mellitus with hyperosmolarity without nonketotic hyperglycemic-hyperosmolar coma (NKHHC): Secondary | ICD-10-CM | POA: Diagnosis present

## 2012-03-24 DIAGNOSIS — D631 Anemia in chronic kidney disease: Secondary | ICD-10-CM | POA: Diagnosis present

## 2012-03-24 DIAGNOSIS — I509 Heart failure, unspecified: Secondary | ICD-10-CM | POA: Diagnosis present

## 2012-03-24 DIAGNOSIS — I5032 Chronic diastolic (congestive) heart failure: Secondary | ICD-10-CM | POA: Diagnosis present

## 2012-03-24 DIAGNOSIS — Z6841 Body Mass Index (BMI) 40.0 and over, adult: Secondary | ICD-10-CM

## 2012-03-24 LAB — CBC WITH DIFFERENTIAL/PLATELET
Basophils Absolute: 0 10*3/uL (ref 0.0–0.1)
Basophils Relative: 0 % (ref 0–1)
Eosinophils Absolute: 0.3 10*3/uL (ref 0.0–0.7)
Eosinophils Relative: 4 % (ref 0–5)
HCT: 35.4 % — ABNORMAL LOW (ref 36.0–46.0)
Hemoglobin: 11.4 g/dL — ABNORMAL LOW (ref 12.0–15.0)
Lymphocytes Relative: 34 % (ref 12–46)
Lymphs Abs: 2.3 10*3/uL (ref 0.7–4.0)
MCH: 26.5 pg (ref 26.0–34.0)
MCHC: 32.2 g/dL (ref 30.0–36.0)
MCV: 82.3 fL (ref 78.0–100.0)
Monocytes Absolute: 0.6 10*3/uL (ref 0.1–1.0)
Monocytes Relative: 9 % (ref 3–12)
Neutro Abs: 3.7 10*3/uL (ref 1.7–7.7)
Neutrophils Relative %: 53 % (ref 43–77)
Platelets: 259 10*3/uL (ref 150–400)
RBC: 4.3 MIL/uL (ref 3.87–5.11)
RDW: 13.9 % (ref 11.5–15.5)
WBC: 6.9 10*3/uL (ref 4.0–10.5)

## 2012-03-24 LAB — BASIC METABOLIC PANEL
BUN: 15 mg/dL (ref 6–23)
CO2: 29 mEq/L (ref 19–32)
Calcium: 8.8 mg/dL (ref 8.4–10.5)
Chloride: 96 mEq/L (ref 96–112)
Creatinine, Ser: 0.99 mg/dL (ref 0.50–1.10)
GFR calc Af Amer: 74 mL/min — ABNORMAL LOW (ref >90)
GFR calc non Af Amer: 64 mL/min — ABNORMAL LOW (ref >90)
Glucose, Bld: 255 mg/dL — ABNORMAL HIGH (ref 70–99)
Potassium: 3.8 mEq/L (ref 3.5–5.1)
Sodium: 133 mEq/L — ABNORMAL LOW (ref 135–145)

## 2012-03-24 LAB — POCT I-STAT TROPONIN I: Troponin i, poc: 0 ng/mL (ref 0.00–0.08)

## 2012-03-24 MED ORDER — OXYCODONE-ACETAMINOPHEN 5-325 MG PO TABS
1.0000 | ORAL_TABLET | Freq: Once | ORAL | Status: AC
  Administered 2012-03-24: 1 via ORAL
  Filled 2012-03-24: qty 1

## 2012-03-24 NOTE — ED Notes (Signed)
Pt c/o sharp chest/abd pain since Monday. States pain has worsened and "I can't take it no more." Pt also c/o SOB.

## 2012-03-25 ENCOUNTER — Encounter (HOSPITAL_COMMUNITY): Payer: Self-pay | Admitting: Internal Medicine

## 2012-03-25 ENCOUNTER — Inpatient Hospital Stay (HOSPITAL_COMMUNITY): Payer: Self-pay

## 2012-03-25 DIAGNOSIS — E119 Type 2 diabetes mellitus without complications: Secondary | ICD-10-CM

## 2012-03-25 DIAGNOSIS — I509 Heart failure, unspecified: Secondary | ICD-10-CM

## 2012-03-25 DIAGNOSIS — R6889 Other general symptoms and signs: Secondary | ICD-10-CM

## 2012-03-25 DIAGNOSIS — K859 Acute pancreatitis without necrosis or infection, unspecified: Principal | ICD-10-CM | POA: Diagnosis present

## 2012-03-25 DIAGNOSIS — N183 Chronic kidney disease, stage 3 unspecified: Secondary | ICD-10-CM | POA: Diagnosis present

## 2012-03-25 DIAGNOSIS — D631 Anemia in chronic kidney disease: Secondary | ICD-10-CM | POA: Diagnosis present

## 2012-03-25 DIAGNOSIS — E1165 Type 2 diabetes mellitus with hyperglycemia: Secondary | ICD-10-CM | POA: Diagnosis present

## 2012-03-25 DIAGNOSIS — E1100 Type 2 diabetes mellitus with hyperosmolarity without nonketotic hyperglycemic-hyperosmolar coma (NKHHC): Secondary | ICD-10-CM | POA: Diagnosis present

## 2012-03-25 DIAGNOSIS — E1122 Type 2 diabetes mellitus with diabetic chronic kidney disease: Secondary | ICD-10-CM | POA: Diagnosis present

## 2012-03-25 DIAGNOSIS — I10 Essential (primary) hypertension: Secondary | ICD-10-CM

## 2012-03-25 LAB — HEMOGLOBIN A1C
Hgb A1c MFr Bld: 7.5 % — ABNORMAL HIGH (ref <5.7)
Mean Plasma Glucose: 169 mg/dL — ABNORMAL HIGH (ref <117)

## 2012-03-25 LAB — LIPID PANEL
Cholesterol: 152 mg/dL (ref 0–200)
HDL: 40 mg/dL (ref >39)
LDL Cholesterol: 92 mg/dL (ref 0–99)
Total CHOL/HDL Ratio: 3.8 RATIO
Triglycerides: 101 mg/dL (ref <150)
VLDL: 20 mg/dL (ref 0–40)

## 2012-03-25 LAB — HEPATIC FUNCTION PANEL
ALT: 14 U/L (ref 0–35)
AST: 16 U/L (ref 0–37)
Albumin: 3.1 g/dL — ABNORMAL LOW (ref 3.5–5.2)
Alkaline Phosphatase: 92 U/L (ref 39–117)
Bilirubin, Direct: 0.1 mg/dL (ref 0.0–0.3)
Indirect Bilirubin: 0.4 mg/dL (ref 0.3–0.9)
Total Bilirubin: 0.5 mg/dL (ref 0.3–1.2)
Total Protein: 7.3 g/dL (ref 6.0–8.3)

## 2012-03-25 LAB — GLUCOSE, CAPILLARY
Glucose-Capillary: 210 mg/dL — ABNORMAL HIGH (ref 70–99)
Glucose-Capillary: 171 mg/dL — ABNORMAL HIGH (ref 70–99)
Glucose-Capillary: 159 mg/dL — ABNORMAL HIGH (ref 70–99)
Glucose-Capillary: 133 mg/dL — ABNORMAL HIGH (ref 70–99)
Glucose-Capillary: 129 mg/dL — ABNORMAL HIGH (ref 70–99)

## 2012-03-25 LAB — LIPASE, BLOOD
Lipase: 1527 U/L — ABNORMAL HIGH (ref 11–59)
Lipase: 1226 U/L — ABNORMAL HIGH (ref 11–59)

## 2012-03-25 MED ORDER — INSULIN GLARGINE 100 UNIT/ML ~~LOC~~ SOLN
15.0000 [IU] | Freq: Every day | SUBCUTANEOUS | Status: DC
  Administered 2012-03-25 – 2012-03-30 (×6): 15 [IU] via SUBCUTANEOUS

## 2012-03-25 MED ORDER — IOHEXOL 300 MG/ML  SOLN
100.0000 mL | Freq: Once | INTRAMUSCULAR | Status: AC | PRN
  Administered 2012-03-25: 100 mL via INTRAVENOUS

## 2012-03-25 MED ORDER — ONDANSETRON HCL 4 MG/2ML IJ SOLN
4.0000 mg | Freq: Once | INTRAMUSCULAR | Status: AC
  Administered 2012-03-25: 4 mg via INTRAVENOUS
  Filled 2012-03-25: qty 2

## 2012-03-25 MED ORDER — ACETAMINOPHEN 325 MG PO TABS
650.0000 mg | ORAL_TABLET | Freq: Four times a day (QID) | ORAL | Status: DC | PRN
  Administered 2012-03-26: 650 mg via ORAL
  Filled 2012-03-25 (×2): qty 2

## 2012-03-25 MED ORDER — ONDANSETRON HCL 4 MG/2ML IJ SOLN
4.0000 mg | Freq: Four times a day (QID) | INTRAMUSCULAR | Status: DC | PRN
  Administered 2012-03-26: 4 mg via INTRAVENOUS
  Filled 2012-03-25: qty 2

## 2012-03-25 MED ORDER — ONDANSETRON HCL 4 MG PO TABS: 4.0000 mg | ORAL_TABLET | Freq: Four times a day (QID) | ORAL | Status: DC | PRN

## 2012-03-25 MED ORDER — SODIUM CHLORIDE 0.9 % IV SOLN
INTRAVENOUS | Status: AC
  Administered 2012-03-25: 07:00:00 via INTRAVENOUS

## 2012-03-25 MED ORDER — SODIUM CHLORIDE 0.9 % IV SOLN
INTRAVENOUS | Status: DC
  Administered 2012-03-26 (×2): via INTRAVENOUS

## 2012-03-25 MED ORDER — SODIUM CHLORIDE 0.9 % IV SOLN
Freq: Once | INTRAVENOUS | Status: AC
  Administered 2012-03-25: 04:00:00 via INTRAVENOUS

## 2012-03-25 MED ORDER — PANTOPRAZOLE SODIUM 40 MG IV SOLR
40.0000 mg | Freq: Once | INTRAVENOUS | Status: AC
  Administered 2012-03-25: 40 mg via INTRAVENOUS
  Filled 2012-03-25: qty 40

## 2012-03-25 MED ORDER — HYDROMORPHONE HCL PF 1 MG/ML IJ SOLN
1.0000 mg | INTRAMUSCULAR | Status: AC | PRN
  Administered 2012-03-25 (×2): 1 mg via INTRAVENOUS
  Filled 2012-03-25 (×2): qty 1

## 2012-03-25 MED ORDER — SODIUM CHLORIDE 0.9 % IV BOLUS (SEPSIS)
500.0000 mL | Freq: Once | INTRAVENOUS | Status: AC
  Administered 2012-03-25: 500 mL via INTRAVENOUS

## 2012-03-25 MED ORDER — CARVEDILOL 6.25 MG PO TABS
6.2500 mg | ORAL_TABLET | Freq: Two times a day (BID) | ORAL | Status: DC
  Administered 2012-03-25 – 2012-03-30 (×11): 6.25 mg via ORAL
  Filled 2012-03-25 (×12): qty 1

## 2012-03-25 MED ORDER — HYDROMORPHONE HCL PF 1 MG/ML IJ SOLN
0.5000 mg | INTRAMUSCULAR | Status: DC | PRN
  Administered 2012-03-25: 0.5 mg via INTRAVENOUS
  Filled 2012-03-25: qty 1

## 2012-03-25 MED ORDER — LABETALOL HCL 5 MG/ML IV SOLN
5.0000 mg | INTRAVENOUS | Status: DC | PRN
  Filled 2012-03-25: qty 4

## 2012-03-25 MED ORDER — ACETAMINOPHEN 650 MG RE SUPP: 650.0000 mg | Freq: Four times a day (QID) | RECTAL | Status: DC | PRN

## 2012-03-25 MED ORDER — PNEUMOCOCCAL VAC POLYVALENT 25 MCG/0.5ML IJ INJ
0.5000 mL | INJECTION | INTRAMUSCULAR | Status: AC
  Administered 2012-03-26: 0.5 mL via INTRAMUSCULAR
  Filled 2012-03-25: qty 0.5

## 2012-03-25 MED ORDER — ENOXAPARIN SODIUM 40 MG/0.4ML ~~LOC~~ SOLN
40.0000 mg | SUBCUTANEOUS | Status: DC
  Administered 2012-03-25 – 2012-03-29 (×5): 40 mg via SUBCUTANEOUS
  Filled 2012-03-25 (×6): qty 0.4

## 2012-03-25 MED ORDER — GI COCKTAIL ~~LOC~~
30.0000 mL | Freq: Once | ORAL | Status: AC
  Administered 2012-03-25: 30 mL via ORAL
  Filled 2012-03-25: qty 30

## 2012-03-25 MED ORDER — ONDANSETRON HCL 4 MG/2ML IJ SOLN: 4.0000 mg | Freq: Three times a day (TID) | INTRAMUSCULAR | Status: AC | PRN

## 2012-03-25 MED ORDER — HYDROMORPHONE HCL PF 1 MG/ML IJ SOLN
1.0000 mg | Freq: Once | INTRAMUSCULAR | Status: AC
  Administered 2012-03-25: 1 mg via INTRAVENOUS
  Filled 2012-03-25: qty 1

## 2012-03-25 MED ORDER — DIPHENHYDRAMINE HCL 50 MG/ML IJ SOLN
25.0000 mg | Freq: Once | INTRAMUSCULAR | Status: AC
  Administered 2012-03-25: 25 mg via INTRAVENOUS
  Filled 2012-03-25: qty 1

## 2012-03-25 MED ORDER — INSULIN ASPART 100 UNIT/ML ~~LOC~~ SOLN
0.0000 [IU] | Freq: Three times a day (TID) | SUBCUTANEOUS | Status: DC
  Administered 2012-03-25: 1 [IU] via SUBCUTANEOUS
  Administered 2012-03-25 – 2012-03-26 (×3): 2 [IU] via SUBCUTANEOUS
  Administered 2012-03-26: 1 [IU] via SUBCUTANEOUS
  Administered 2012-03-26: 2 [IU] via SUBCUTANEOUS
  Administered 2012-03-27: 3 [IU] via SUBCUTANEOUS
  Administered 2012-03-27 – 2012-03-29 (×5): 2 [IU] via SUBCUTANEOUS
  Administered 2012-03-29: 5 [IU] via SUBCUTANEOUS
  Administered 2012-03-29 – 2012-03-30 (×2): 2 [IU] via SUBCUTANEOUS
  Administered 2012-03-30: 1 [IU] via SUBCUTANEOUS
  Administered 2012-03-30 – 2012-04-01 (×5): 2 [IU] via SUBCUTANEOUS
  Administered 2012-04-01: 5 [IU] via SUBCUTANEOUS
  Administered 2012-04-02: 18:00:00 via SUBCUTANEOUS
  Administered 2012-04-02: 5 [IU] via SUBCUTANEOUS
  Administered 2012-04-02: 2 [IU] via SUBCUTANEOUS
  Administered 2012-04-03 (×2): 3 [IU] via SUBCUTANEOUS

## 2012-03-25 MED ORDER — SODIUM CHLORIDE 0.9 % IV BOLUS (SEPSIS): 500.0000 mL | Freq: Once | INTRAVENOUS | Status: DC

## 2012-03-25 NOTE — ED Notes (Signed)
Admitting MD at bedside.

## 2012-03-25 NOTE — Progress Notes (Signed)
Recieved at referral for medication assistance. Spoke with patient at bedside. States she moved here from [place] approx 2 months ago. Lives with her dtr, unemployed. Has gotten assistance from her dtr for meds but not sure she can get more insultin. Discussed needy meds website and other meds with assistance programs for long term solutions. Patient states she would be able to access sites on the internet. Discussed Kristopher Oppenheim meds for free, currently on [employer]. Discussed indigent med program, contacted pharmacy and patient does qualify for indigent funds. Discussed limitations of program with patient (up to $100, 3 days of meds, eligible only once per year). Patient appreciative of assistance. Will continue to follow for d/c needs.

## 2012-03-25 NOTE — ED Provider Notes (Signed)
History    56 year old female with abdominal pain. Onset around Monday. Gradual onset. Pain is in epigastrium and rate her rate is on the costal margins into her back. Relatively constant. Worse shortly after eating. No fevers or chills. Nausea and vomiting. Anorexia. No urinary complaints. Patient is status post cholecystectomy. Reports history of peptic ulcer which she takes ranitidine for. No fevers or chills.   CSN: [id number]  Arrival date & time 03/24/12  2125   First MD Initiated Contact with Patient 03/25/12 0020      Chief Complaint  Patient presents with  . Shortness of Breath  . Chest Pain    (Consider location/radiation/quality/duration/timing/severity/associated sxs/prior treatment) HPI  Past Medical History  Diagnosis Date  . CHF (congestive heart failure)   . Bronchitis   . Diabetes mellitus   . Hypertension   . Ulcer   . Hepatitis C     Past Surgical History  Procedure Date  . Tubal ligation   . Cholecystectomy     History reviewed. No pertinent family history.  History  Substance Use Topics  . Smoking status: Former Research scientist (life sciences)  . Smokeless tobacco: Not on file  . Alcohol Use: Yes     ocassionally    OB History    Grav Para Term Preterm Abortions TAB SAB Ect Mult Living                  Review of Systems   Review of symptoms negative unless otherwise noted in HPI.   Allergies  Review of patient's allergies indicates no known allergies.  Home Medications   Current Outpatient Rx  Name Route Sig Dispense Refill  . ASPIRIN 325 MG PO TBEC Oral Take 325 mg by mouth daily.    [name] CARVEDILOL 12.5 MG PO TABS Oral Take 12.5 mg by mouth 2 (two) times daily with a meal.    . DOXAZOSIN MESYLATE 2 MG PO TABS Oral Take 2 mg by mouth daily.    [name] HYDROCHLOROTHIAZIDE 25 MG PO TABS Oral Take 25 mg by mouth daily.    . INSULIN ISOPHANE & REGULAR (70-30) 100 UNIT/ML [place] SUSP Subcutaneous Inject 60 Units into the skin 2 (two) times daily.    [name] METFORMIN HCL  1000 MG PO TABS Oral Take 1,000 mg by mouth 2 (two) times daily with a meal.    . RANITIDINE HCL 300 MG PO CAPS Oral Take 300 mg by mouth every evening.      BP 162/62  Pulse 76  Temp 99.6 F (37.6 C) (Oral)  Resp 16  SpO2 95%  Physical Exam  Nursing note and vitals reviewed. Constitutional: She appears well-developed.       Laying in bed. Uncomfortable appearing. Obese.  HENT:  Head: Normocephalic and atraumatic.  Eyes: Conjunctivae normal are normal. Right eye exhibits no discharge. Left eye exhibits no discharge.  Neck: Neck supple.  Cardiovascular: Normal rate, regular rhythm and normal heart sounds.  Exam reveals no gallop and no friction rub.   No murmur heard. Pulmonary/Chest: Effort normal and breath sounds normal. No respiratory distress.  Abdominal: Soft. She exhibits no distension. There is tenderness.       Tenderness in the epigastrium with some guarding. No rebound tenderness. No distention.  Genitourinary:       No costovertebral angle tenderness  Musculoskeletal: She exhibits no edema and no tenderness.  Neurological: She is alert.  Skin: Skin is warm and dry.  Psychiatric: She has a normal mood and affect. Her behavior is  normal. Thought content normal.    ED Course  Procedures (including critical care time)  Labs Reviewed  CBC WITH DIFFERENTIAL - Abnormal; Notable for the following:    Hemoglobin 11.4 (*)     HCT 35.4 (*)     All other components within normal limits  BASIC METABOLIC PANEL - Abnormal; Notable for the following:    Sodium 133 (*)     Glucose, Bld 255 (*)     GFR calc non Af Amer 64 (*)     GFR calc Af Amer 74 (*)     All other components within normal limits  POCT I-STAT TROPONIN I   Dg Chest 2 View  03/24/2012  *RADIOLOGY REPORT*  Clinical Data: Chest pain, shortness of breath and abdominal pain.  CHEST - 2 VIEW  Comparison: 02/07/2012.  Findings: Stable borderline enlarged cardiac silhouette and mildly prominent pulmonary  vasculature.  Stable minimal linear scarring at the right lung base.  The interstitial markings remain minimally prominent.  Unremarkable bones.  Probable cholecystectomy clips.  IMPRESSION:  1.  No acute abnormality. 2.  Stable borderline cardiomegaly, mild pulmonary vascular congestion and minimal chronic interstitial lung disease.   Original Report Authenticated By: Gerald Stabs, M.D.    EKG:  Rhythm: normal Vent. rate 80 BPM PR interval 140 ms QRS duration 86 ms QT/QTc 396/457 ms Axis: normal Intervals/Conduction: normal ST segments: normal   1. Abdominal pain   2. Pancreatitis   3. Acute pancreatitis   4. CHF (congestive heart failure)   5. DM2 (diabetes mellitus, type 2)   6. HTN (hypertension)       MDM  56 year old female with abdominal pain which radiates to the back. Workup significant for an elevated lipase. The patient's last prior history of pancreatitis. Patient is status post cholecystectomy. Her LFTs are normal. She denies significant alcohol intake. Possibly triglyceride or  medication induced. The patient received IV narcotics. She is n.p.o. She received IV fluids. Will admit for further symptomatic control.        Virgel Manifold, MD 03/25/12 [phone]

## 2012-03-25 NOTE — Progress Notes (Signed)
[facility] PROGRESS NOTE  [name] [id number] DOB: [birth year] DOA: 03/24/2012 PCP: York Ram  Brief narrative:  56 year-old female with history of hypertension, diabetes mellitus type 2, hepatitis C, diastolic CHF as per echo in 2009 presents with complaint of abdominal pain secondary to acute pancreatitis.  Assessment/Plan:  Principal Problem:   *Acute pancreatitis No clear etiology. She denies heavy alcohol use and has had cholecystectomy almost 25 years ago. She is on multiple medications including HCTZ, metformin, and ranitidine which can cause acute pancreatitis. -CT abdomen and pelvis suggestive off acute pancreatitis in the tail of pancreas. -Maintain n.p.o. for now. We'll start clears once tolerated -When necessary Dilaudid for pain. Zofran when necessary for nausea and vomiting. -Monitor lipase. -Hold HCTZ, metformin and ranitidine.  Active Problems:  HTN (hypertension) Will resume home dose of Coreg. Hold HCTZ.    DM2 (diabetes mellitus, type 2) Holding metformin. Order half toes off Lantus and sliding scale insulin while patient is n.p.o.   CHF (congestive heart failure) - euvolemic. Monitor I/O.   Anemia Currently stable.   DVT prophylaxis: subcutaneous Lovenox  Code Status: Full code Family Communication: None at bedside Disposition Plan: Home once stable     Consultants:  None  Procedures:  None  Antibiotics:  None  HPI/Subjective: Still has pain over the epigastric and left upper quadrant area. Denies nausea or vomiting  Objective: Filed Vitals:   03/25/12 0330 03/25/12 0400 03/25/12 0411 03/25/12 0517  BP: 157/73 144/68 144/68 150/69  Pulse: 70 72 70 70  Temp:    98 F (36.7 C)  TempSrc:    Oral  Resp: 12 11 18 18   Height:    5\' 7"  (1.702 m)  Weight:    127.914 kg (282 lb)  SpO2: 91% 90% 94% 94%    Intake/Output Summary (Last 24 hours) at 03/25/12 1415 Last data filed at 03/25/12 [id number]  Gross per 24 hour    Intake      0 ml  Output    500 ml  Net   -500 ml   Filed Weights   03/25/12 0517  Weight: 127.914 kg (282 lb)    Exam:   General:  Middle aged female lying in bed in no acute distress  HEENT: No pallor moist oral mucosa  Cardiovascular: Normal S1 and S2 no murmurs rub or gallop  Respiratory: Clearbreath sounds bilaterally  Abdomen: Soft, nondistended, bowel sounds present, tender to palpation over epigastric and left upper quadrant.  Extremities: Warm, no edema  CNS: AAO x3  Data Reviewed: Basic Metabolic Panel:  Lab [id number] 2155  NA 133*  K 3.8  CL 96  CO2 29  GLUCOSE 255*  BUN 15  CREATININE 0.99  CALCIUM 8.8  MG --  PHOS --   Liver Function Tests:  Lab 03/24/12 2155  AST 16  ALT 14  ALKPHOS 92  BILITOT 0.5  PROT 7.3  ALBUMIN 3.1*    Lab 03/25/12 0615 03/24/12 2155  LIPASE 1226* 1527*  AMYLASE -- --   No results found for this basename: AMMONIA:5 in the last 168 hours CBC:  Lab 03/24/12 2155  WBC 6.9  NEUTROABS 3.7  HGB 11.4*  HCT 35.4*  MCV 82.3  PLT 259   Cardiac Enzymes: No results found for this basename: CKTOTAL:5,CKMB:5,CKMBINDEX:5,TROPONINI:5 in the last 168 hours BNP (last 3 results)  Basename 02/07/12 1800  PROBNP 83.0   CBG:  Lab 03/25/12 1218 03/25/12 0757 03/25/12 0410  GLUCAP 159* 171* 210*    No  results found for this or any previous visit (from the past 240 hour(s)).   Studies: Dg Chest 2 View  03/24/2012  *RADIOLOGY REPORT*  Clinical Data: Chest pain, shortness of breath and abdominal pain.  CHEST - 2 VIEW  Comparison: 02/07/2012.  Findings: Stable borderline enlarged cardiac silhouette and mildly prominent pulmonary vasculature.  Stable minimal linear scarring at the right lung base.  The interstitial markings remain minimally prominent.  Unremarkable bones.  Probable cholecystectomy clips.  IMPRESSION:  1.  No acute abnormality. 2.  Stable borderline cardiomegaly, mild pulmonary vascular congestion and minimal  chronic interstitial lung disease.   Original Report Authenticated By: Gerald Stabs, M.D.    Ct Abdomen Pelvis W Contrast  03/25/2012  *RADIOLOGY REPORT*  Clinical Data: Pancreatitis, mid abdominal pain, follow-up  CT ABDOMEN AND PELVIS WITH CONTRAST  Technique:  Multidetector CT imaging of the abdomen and pelvis was performed following the standard protocol during bolus administration of intravenous contrast.  Contrast: 183mL OMNIPAQUE IOHEXOL 300 MG/ML  SOLN  Comparison:   CT abdomen pelvis of 04/11/2010  Findings: There is bibasilar linear atelectasis in both lower lobes.  The liver is slightly prominent and surgical clips are present from prior cholecystectomy.  The pancreas is normal in the head and body with some prominence of the distal body and tail. Around distal bony tail there is a small amount of exudate consistent with pancreatitis, but no pseudocyst formation is seen. The adrenal glands and spleen are unremarkable.  The stomach is not well distended.  The kidneys enhance and there are small low attenuation structures most consistent with cysts, but too small to characterize on the present study.  The abdominal aorta is normal in caliber.  A few mesenteric and retroperitoneal nodes present which are not pathologically enlarged.  The urinary bladder is not well distended.  The uterus is normal in size.  Only a tiny amount of free fluid is present in the pelvis. No bony abnormality is seen.  The terminal ileum and the appendix are unremarkable. No bony abnormality is seen.  IMPRESSION:  1.  Findings compatible with pancreatitis of the distal body and tail of the pancreas with a small amount of surrounding exudate. No pseudocyst. 2.  Bibasilar linear atelectasis. 3.  Slight prominence of the liver.   Original Report Authenticated By: Joretta Bachelor, M.D.     Scheduled Meds:   . sodium chloride   Intravenous Once  . sodium chloride   Intravenous STAT  . diphenhydrAMINE  25 mg Intravenous Once  .  gi cocktail  30 mL Oral Once  .  HYDROmorphone (DILAUDID) injection  1 mg Intravenous Once  .  HYDROmorphone (DILAUDID) injection  1 mg Intravenous Once  . insulin aspart  0-9 Units Subcutaneous TID WC  . insulin glargine  15 Units Subcutaneous Daily  . ondansetron (ZOFRAN) IV  4 mg Intravenous Once  . oxyCODONE-acetaminophen  1 tablet Oral Once  . pantoprazole (PROTONIX) IV  40 mg Intravenous Once  . pneumococcal 23 valent vaccine  0.5 mL Intramuscular Tomorrow-1000  . sodium chloride  500 mL Intravenous Once  . DISCONTD: sodium chloride  500 mL Intravenous Once   Continuous Infusions:   . sodium chloride        Time spent: 30 minutes    Najib Colmenares, [facility] Pager [phone]. If 8PM-8AM, please contact night-coverage at [email or web address], password [facility] 03/25/2012, 2:15 PM  LOS: 1 day

## 2012-03-25 NOTE — ED Notes (Signed)
MD at bedside. 

## 2012-03-25 NOTE — Progress Notes (Signed)
03/25/12  Spoke with patient about her insulin dosage that she takes at home.  States that she takes Novolin 70/30 65 units every AM and 55 units every PM.  Sees Dr. Jimmye Norman in [facility] for PCP. States that she is not able to pay for the insulin and her daughter cannot afford it either.  Has been in the MAP program in the past.  Does not have enough insulin at home to take when she is discharged.  Suggested that care management see her.  Staff nurse to talk with physician and ask for care management consult.  Will continue to follow while in hospital.   Harvel Ricks RN BSN CDE

## 2012-03-25 NOTE — H&P (Signed)
[name] is an 56 y.o. female.  Patient was seen and examined on March 25, 2012 at 4:05 AM. PCP - Dr. York Ram. Chief Complaint: Abdominal pain. HPI: 56 year-old female with history of hypertension, diabetes mellitus2, hepatitis C, CHF diastolic last EF measured in 2009 was 60% presents with complaint of abdominal pain. Patient's abdominal pain initially was on the left upper quadrant and eventually became more epigastric over the last 4 days. The pain has been gradually worsening and radiating to the back. Has been having some nausea denies any vomiting. Had 2 episodes of diarrhea. Patient's pain worsens on eating. Since the pain is worsened and persisted now patient came to the ER. In the ER patient's lipase level was found to be high and has been admitted for further workup for pancreatitis. Denies any chest pain or shortness of breath.  Past Medical History  Diagnosis Date  . CHF (congestive heart failure)   . Bronchitis   . Diabetes mellitus   . Hypertension   . Ulcer   . Hepatitis C     Past Surgical History  Procedure Date  . Tubal ligation   . Cholecystectomy     Family History  Problem Relation Age of Onset  . Colon cancer Mother    Social History:  reports that she has quit smoking. She does not have any smokeless tobacco history on file. She reports that she drinks alcohol. She reports that she does not use illicit drugs.  Allergies: No Known Allergies   (Not in a hospital admission)  Results for orders placed during the hospital encounter of 03/24/12 (from the past 48 hour(s))  CBC WITH DIFFERENTIAL     Status: Abnormal   Collection Time   03/24/12  9:55 PM      Component Value Range Comment   WBC 6.9  4.0 - 10.5 K/uL    RBC 4.30  3.87 - 5.11 MIL/uL    Hemoglobin 11.4 (*) 12.0 - 15.0 g/dL    HCT 35.4 (*) 36.0 - 46.0 %    MCV 82.3  78.0 - 100.0 fL    MCH 26.5  26.0 - 34.0 pg    MCHC 32.2  30.0 - 36.0 g/dL    RDW 13.9  11.5 - 15.5 %    Platelets  259  150 - 400 K/uL    Neutrophils Relative 53  43 - 77 %    Neutro Abs 3.7  1.7 - 7.7 K/uL    Lymphocytes Relative 34  12 - 46 %    Lymphs Abs 2.3  0.7 - 4.0 K/uL    Monocytes Relative 9  3 - 12 %    Monocytes Absolute 0.6  0.1 - 1.0 K/uL    Eosinophils Relative 4  0 - 5 %    Eosinophils Absolute 0.3  0.0 - 0.7 K/uL    Basophils Relative 0  0 - 1 %    Basophils Absolute 0.0  0.0 - 0.1 K/uL   BASIC METABOLIC PANEL     Status: Abnormal   Collection Time   03/24/12  9:55 PM      Component Value Range Comment   Sodium 133 (*) 135 - 145 mEq/L    Potassium 3.8  3.5 - 5.1 mEq/L    Chloride 96  96 - 112 mEq/L    CO2 29  19 - 32 mEq/L    Glucose, Bld 255 (*) 70 - 99 mg/dL    BUN 15  6 - 23  mg/dL    Creatinine, Ser 0.99  0.50 - 1.10 mg/dL    Calcium 8.8  8.4 - 10.5 mg/dL    GFR calc non Af Amer 64 (*) >90 mL/min    GFR calc Af Amer 74 (*) >90 mL/min   HEPATIC FUNCTION PANEL     Status: Abnormal   Collection Time   03/24/12  9:55 PM      Component Value Range Comment   Total Protein 7.3  6.0 - 8.3 g/dL    Albumin 3.1 (*) 3.5 - 5.2 g/dL    AST 16  0 - 37 U/L    ALT 14  0 - 35 U/L    Alkaline Phosphatase 92  39 - 117 U/L    Total Bilirubin 0.5  0.3 - 1.2 mg/dL    Bilirubin, Direct 0.1  0.0 - 0.3 mg/dL    Indirect Bilirubin 0.4  0.3 - 0.9 mg/dL   LIPASE, BLOOD     Status: Abnormal   Collection Time   03/24/12  9:55 PM      Component Value Range Comment   Lipase 1527 (*) 11 - 59 U/L   POCT I-STAT TROPONIN I     Status: Normal   Collection Time   03/24/12 10:15 PM      Component Value Range Comment   Troponin i, poc 0.00  0.00 - 0.08 ng/mL    Comment 3            GLUCOSE, CAPILLARY     Status: Abnormal   Collection Time   03/25/12  4:10 AM      Component Value Range Comment   Glucose-Capillary 210 (*) 70 - 99 mg/dL    Comment 1 Notify RN      Comment 2 Documented in Chart      Dg Chest 2 View  03/24/2012  *RADIOLOGY REPORT*  Clinical Data: Chest pain, shortness of breath and  abdominal pain.  CHEST - 2 VIEW  Comparison: 02/07/2012.  Findings: Stable borderline enlarged cardiac silhouette and mildly prominent pulmonary vasculature.  Stable minimal linear scarring at the right lung base.  The interstitial markings remain minimally prominent.  Unremarkable bones.  Probable cholecystectomy clips.  IMPRESSION:  1.  No acute abnormality. 2.  Stable borderline cardiomegaly, mild pulmonary vascular congestion and minimal chronic interstitial lung disease.   Original Report Authenticated By: Gerald Stabs, M.D.     Review of Systems  Constitutional: Negative.   HENT: Negative.   Eyes: Negative.   Respiratory: Negative.   Cardiovascular: Negative.   Gastrointestinal: Positive for abdominal pain.  Genitourinary: Negative.   Musculoskeletal: Negative.   Skin: Negative.   Neurological: Negative.   Endo/Heme/Allergies: Negative.   Psychiatric/Behavioral: Negative.     Blood pressure 144/68, pulse 70, temperature 99.6 F (37.6 C), temperature source Oral, resp. rate 18, SpO2 94.00%. Physical Exam  Constitutional: She is oriented to person, place, and time. She appears well-developed and well-nourished. No distress.  HENT:  Head: Normocephalic and atraumatic.  Right Ear: External ear normal.  Left Ear: External ear normal.  Nose: Nose normal.  Mouth/Throat: Oropharynx is clear and moist. No oropharyngeal exudate.  Eyes: Conjunctivae normal are normal. Pupils are equal, round, and reactive to light. Right eye exhibits no discharge. Left eye exhibits no discharge. No scleral icterus.  Neck: Normal range of motion.  Cardiovascular: Normal rate and regular rhythm.   Respiratory: Effort normal and breath sounds normal. No respiratory distress. She has no wheezes. She has no rales.  GI: Soft. Bowel sounds are normal. She exhibits no distension. There is tenderness. There is no rebound and no guarding.  Musculoskeletal: Normal range of motion. She exhibits no edema and no  tenderness.  Neurological: She is alert and oriented to person, place, and time.       Moves all extremities.  Skin: Skin is warm and dry. No rash noted. She is not diaphoretic. No erythema.     Assessment/Plan #1. Acute pancreatitis - cause is not clear. Patient drinks alcohol very occasionally. Last drink was on September 1. Has had cholecystectomy. At this time I have ordered CT abdomen and pelvis with contrast. Check lipid panel. Keep patient n.p.o. Pain relief medications. Gentle hydration. #2. Diabetes mellitus2 - since patient will be n.p.o. I place patient on a small does of Lantus with sliding scale coverage. May change to her home dose NPH once patient is able to tolerate oral intake. #3. Hypertension and history of diastolic CHF - as patient is n.p.o. I have placed patient on when necessary IV labetalol for systolic blood pressure more than 160. #4. History of hepatitis C - further workup as outpatient. #5. Anemia - patient is not sure if she had colonoscopy. Patient will need colonoscopy as outpatient since patient's family history of colon cancer. For now we will follow CBC.  CODE STATUS - full code.  Bonne Whack N. 03/25/2012, 4:31 AM

## 2012-03-25 NOTE — ED Notes (Signed)
Pt ok to have some ice chips per Wilson Singer, MD.  Pt given some ice chips.  Explain to pt she is not suppose to have anything PO.

## 2012-03-26 DIAGNOSIS — R109 Unspecified abdominal pain: Secondary | ICD-10-CM

## 2012-03-26 LAB — COMPREHENSIVE METABOLIC PANEL
ALT: 13 U/L (ref 0–35)
AST: 17 U/L (ref 0–37)
Albumin: 3 g/dL — ABNORMAL LOW (ref 3.5–5.2)
Alkaline Phosphatase: 87 U/L (ref 39–117)
BUN: 12 mg/dL (ref 6–23)
CO2: 26 mEq/L (ref 19–32)
Calcium: 8.3 mg/dL — ABNORMAL LOW (ref 8.4–10.5)
Chloride: 99 mEq/L (ref 96–112)
Creatinine, Ser: 0.91 mg/dL (ref 0.50–1.10)
GFR calc Af Amer: 81 mL/min — ABNORMAL LOW (ref >90)
GFR calc non Af Amer: 70 mL/min — ABNORMAL LOW (ref >90)
Glucose, Bld: 165 mg/dL — ABNORMAL HIGH (ref 70–99)
Potassium: 3.7 mEq/L (ref 3.5–5.1)
Sodium: 135 mEq/L (ref 135–145)
Total Bilirubin: 0.5 mg/dL (ref 0.3–1.2)
Total Protein: 6.9 g/dL (ref 6.0–8.3)

## 2012-03-26 LAB — CBC WITH DIFFERENTIAL/PLATELET
Basophils Absolute: 0 10*3/uL (ref 0.0–0.1)
Basophils Relative: 0 % (ref 0–1)
Eosinophils Absolute: 0.3 10*3/uL (ref 0.0–0.7)
Eosinophils Relative: 5 % (ref 0–5)
HCT: 33 % — ABNORMAL LOW (ref 36.0–46.0)
Hemoglobin: 10.6 g/dL — ABNORMAL LOW (ref 12.0–15.0)
Lymphocytes Relative: 30 % (ref 12–46)
Lymphs Abs: 1.9 10*3/uL (ref 0.7–4.0)
MCH: 26.8 pg (ref 26.0–34.0)
MCHC: 32.1 g/dL (ref 30.0–36.0)
MCV: 83.3 fL (ref 78.0–100.0)
Monocytes Absolute: 0.6 10*3/uL (ref 0.1–1.0)
Monocytes Relative: 9 % (ref 3–12)
Neutro Abs: 3.6 10*3/uL (ref 1.7–7.7)
Neutrophils Relative %: 56 % (ref 43–77)
Platelets: 242 10*3/uL (ref 150–400)
RBC: 3.96 MIL/uL (ref 3.87–5.11)
RDW: 13.8 % (ref 11.5–15.5)
WBC: 6.4 10*3/uL (ref 4.0–10.5)

## 2012-03-26 LAB — IRON AND TIBC
Iron: 36 ug/dL — ABNORMAL LOW (ref 42–135)
Saturation Ratios: 10 % — ABNORMAL LOW (ref 20–55)
TIBC: 358 ug/dL (ref 250–470)
UIBC: 322 ug/dL (ref 125–400)

## 2012-03-26 LAB — GLUCOSE, CAPILLARY
Glucose-Capillary: 134 mg/dL — ABNORMAL HIGH (ref 70–99)
Glucose-Capillary: 136 mg/dL — ABNORMAL HIGH (ref 70–99)
Glucose-Capillary: 141 mg/dL — ABNORMAL HIGH (ref 70–99)
Glucose-Capillary: 142 mg/dL — ABNORMAL HIGH (ref 70–99)
Glucose-Capillary: 153 mg/dL — ABNORMAL HIGH (ref 70–99)
Glucose-Capillary: 158 mg/dL — ABNORMAL HIGH (ref 70–99)

## 2012-03-26 LAB — LIPASE, BLOOD: Lipase: 912 U/L — ABNORMAL HIGH (ref 11–59)

## 2012-03-26 LAB — VITAMIN B12: Vitamin B-12: 626 pg/mL (ref 211–911)

## 2012-03-26 LAB — FERRITIN: Ferritin: 158 ng/mL (ref 10–291)

## 2012-03-26 LAB — TSH: TSH: 1.254 u[IU]/mL (ref 0.350–4.500)

## 2012-03-26 MED ORDER — MORPHINE SULFATE 2 MG/ML IJ SOLN
2.0000 mg | INTRAMUSCULAR | Status: DC | PRN
  Administered 2012-03-26 – 2012-03-29 (×15): 2 mg via INTRAVENOUS
  Filled 2012-03-26 (×16): qty 1

## 2012-03-26 MED ORDER — INFLUENZA VIRUS VACC SPLIT PF IM SUSP
0.5000 mL | INTRAMUSCULAR | Status: AC
  Administered 2012-03-27: 0.5 mL via INTRAMUSCULAR
  Filled 2012-03-26: qty 0.5

## 2012-03-26 MED ORDER — DOXAZOSIN MESYLATE 2 MG PO TABS
2.0000 mg | ORAL_TABLET | Freq: Every day | ORAL | Status: DC
  Administered 2012-03-26 – 2012-04-03 (×9): 2 mg via ORAL
  Filled 2012-03-26 (×9): qty 1

## 2012-03-26 MED ORDER — DIPHENHYDRAMINE HCL 50 MG/ML IJ SOLN
25.0000 mg | Freq: Four times a day (QID) | INTRAMUSCULAR | Status: DC | PRN
  Administered 2012-03-26 – 2012-03-29 (×6): 25 mg via INTRAVENOUS
  Filled 2012-03-26 (×7): qty 1

## 2012-03-26 MED ORDER — HYDRALAZINE HCL 10 MG PO TABS
10.0000 mg | ORAL_TABLET | Freq: Three times a day (TID) | ORAL | Status: DC | PRN
  Filled 2012-03-26: qty 1

## 2012-03-26 NOTE — Progress Notes (Signed)
[facility] PROGRESS NOTE  [name] [id number] DOB: 08-22-1955 DOA: 03/24/2012 PCP: Harvie Junior, MD  Brief narrative:  56 year-old female with history of hypertension, diabetes mellitus type 2, hepatitis C, diastolic CHF as per echo in 2009 presents with complaint of abdominal pain secondary to acute pancreatitis.   Assessment/Plan:  Principal Problem:  *Acute pancreatitis  No clear etiology. She denies heavy alcohol use and has had cholecystectomy almost 25 years ago. She is on multiple medications including HCTZ, metformin, and ranitidine which can cause acute pancreatitis.  -CT abdomen and pelvis suggestive off acute pancreatitis in the tail of pancreas.  -  start clear liquids and advance as tolerated.  -When necessary Dilaudid for pain. Zofran when necessary for nausea and vomiting.  -lipase still high but slowly trending down. -Hold HCTZ, metformin and ranitidine.   Active Problems:   HTN (hypertension)  Will resume home dose of Coreg. Hold HCTZ. Prn hydralazine  DM2 (diabetes mellitus, type 2)  Holding metformin. Order half toes off Lantus and sliding scale insulin while patient is n.p.o. Resume home dose of novolog once taking po. On high dose of insulin at home. A1C of 7.5.    CHF (congestive heart failure)  - euvolemic. Monitor I/O. Will reduce fluids to 50 cc/hr   Anemia  Currently stable. No previous workup. Check iron panel, b12, folate and TSH levels.  DVT prophylaxis: subcutaneous Lovenox   Code Status: Full code   Family Communication: None at bedside   Disposition Plan: Home once stable   Consultants:  None  Procedures:  None Antibiotics:  None  HPI/Subjective:  Still has pain over the epigastric and left upper quadrant area. Denies nausea or vomiting  Antibiotics:  none  HPI/Subjective: No nausea or vomiting, still has epigastric pain on movement but improving,   Objective: Filed Vitals:   03/26/12 0550 03/26/12 1232  03/26/12 1314 03/26/12 1400  BP: 188/76 158/81  141/75  Pulse: 86   70  Temp: 97.8 F (36.6 C)   98.5 F (36.9 C)  TempSrc: Oral   Oral  Resp: 18   16  Height:      Weight:   127.869 kg (281 lb 14.4 oz)   SpO2: 93%   97%    Intake/Output Summary (Last 24 hours) at 03/26/12 1406 Last data filed at 03/26/12 1403  Gross per 24 hour  Intake 3765.42 ml  Output   2000 ml  Net 1765.42 ml   Filed Weights   03/25/12 0517 03/26/12 1314  Weight: 127.914 kg (282 lb) 127.869 kg (281 lb 14.4 oz)    Exam:  General: Middle aged female lying in bed in no acute distress  HEENT: No pallor moist oral mucosa  Cardiovascular: Normal S1 and S2 no murmurs rub or gallop  Respiratory: Clearbreath sounds bilaterally  Abdomen: Soft, nondistended, bowel sounds present, tender to palpation over epigastric and left upper quadrant.  Extremities: Warm, no edema  CNS: AAO x3   Data Reviewed: Basic Metabolic Panel:  Lab [id number] 0440 03/24/12 2155  NA 135 133*  K 3.7 3.8  CL 99 96  CO2 26 29  GLUCOSE 165* 255*  BUN 12 15  CREATININE 0.91 0.99  CALCIUM 8.3* 8.8  MG -- --  PHOS -- --   Liver Function Tests:  Lab 03/26/12 0440 03/24/12 2155  AST 17 16  ALT 13 14  ALKPHOS 87 92  BILITOT 0.5 0.5  PROT 6.9 7.3  ALBUMIN 3.0* 3.1*    Lab 03/26/12 0440  03/25/12 0615 03/24/12 2155  LIPASE 912* 1226* 1527*  AMYLASE -- -- --   No results found for this basename: AMMONIA:5 in the last 168 hours CBC:  Lab 03/26/12 0440 03/24/12 2155  WBC 6.4 6.9  NEUTROABS 3.6 3.7  HGB 10.6* 11.4*  HCT 33.0* 35.4*  MCV 83.3 82.3  PLT 242 259   Cardiac Enzymes: No results found for this basename: CKTOTAL:5,CKMB:5,CKMBINDEX:5,TROPONINI:5 in the last 168 hours BNP (last 3 results)  Basename 02/07/12 1800  PROBNP 83.0   CBG:  Lab 03/26/12 1148 03/26/12 0754 03/26/12 0416 03/25/12 2359 03/25/12 1951  GLUCAP 158* 136* 142* 134* 129*    No results found for this or any previous visit (from the past  240 hour(s)).   Studies: Dg Chest 2 View  03/24/2012  *RADIOLOGY REPORT*  Clinical Data: Chest pain, shortness of breath and abdominal pain.  CHEST - 2 VIEW  Comparison: 02/07/2012.  Findings: Stable borderline enlarged cardiac silhouette and mildly prominent pulmonary vasculature.  Stable minimal linear scarring at the right lung base.  The interstitial markings remain minimally prominent.  Unremarkable bones.  Probable cholecystectomy clips.  IMPRESSION:  1.  No acute abnormality. 2.  Stable borderline cardiomegaly, mild pulmonary vascular congestion and minimal chronic interstitial lung disease.   Original Report Authenticated By: Gerald Stabs, M.D.    Ct Abdomen Pelvis W Contrast  03/25/2012  *RADIOLOGY REPORT*  Clinical Data: Pancreatitis, mid abdominal pain, follow-up  CT ABDOMEN AND PELVIS WITH CONTRAST  Technique:  Multidetector CT imaging of the abdomen and pelvis was performed following the standard protocol during bolus administration of intravenous contrast.  Contrast: 179mL OMNIPAQUE IOHEXOL 300 MG/ML  SOLN  Comparison:   CT abdomen pelvis of 04/11/2010  Findings: There is bibasilar linear atelectasis in both lower lobes.  The liver is slightly prominent and surgical clips are present from prior cholecystectomy.  The pancreas is normal in the head and body with some prominence of the distal body and tail. Around distal bony tail there is a small amount of exudate consistent with pancreatitis, but no pseudocyst formation is seen. The adrenal glands and spleen are unremarkable.  The stomach is not well distended.  The kidneys enhance and there are small low attenuation structures most consistent with cysts, but too small to characterize on the present study.  The abdominal aorta is normal in caliber.  A few mesenteric and retroperitoneal nodes present which are not pathologically enlarged.  The urinary bladder is not well distended.  The uterus is normal in size.  Only a tiny amount of free fluid  is present in the pelvis. No bony abnormality is seen.  The terminal ileum and the appendix are unremarkable. No bony abnormality is seen.  IMPRESSION:  1.  Findings compatible with pancreatitis of the distal body and tail of the pancreas with a small amount of surrounding exudate. No pseudocyst. 2.  Bibasilar linear atelectasis. 3.  Slight prominence of the liver.   Original Report Authenticated By: Joretta Bachelor, M.D.     Scheduled Meds:   . sodium chloride   Intravenous STAT  . carvedilol  6.25 mg Oral BID WC  . doxazosin  2 mg Oral Daily  . enoxaparin (LOVENOX) injection  40 mg Subcutaneous Q24H  . influenza  inactive virus vaccine  0.5 mL Intramuscular Tomorrow-1000  . insulin aspart  0-9 Units Subcutaneous TID WC  . insulin glargine  15 Units Subcutaneous Daily  . pneumococcal 23 valent vaccine  0.5 mL Intramuscular Tomorrow-1000  Continuous Infusions:   . sodium chloride 50 mL/hr (03/26/12 1143)      Time spent: [place], Elbert  [facility] Pager [phone]. If 8PM-8AM, please contact night-coverage at [email or web address], password [facility] 03/26/2012, 2:06 PM  LOS: 2 days

## 2012-03-27 DIAGNOSIS — D649 Anemia, unspecified: Secondary | ICD-10-CM

## 2012-03-27 LAB — GLUCOSE, CAPILLARY
Glucose-Capillary: 193 mg/dL — ABNORMAL HIGH (ref 70–99)
Glucose-Capillary: 204 mg/dL — ABNORMAL HIGH (ref 70–99)
Glucose-Capillary: 189 mg/dL — ABNORMAL HIGH (ref 70–99)
Glucose-Capillary: 157 mg/dL — ABNORMAL HIGH (ref 70–99)

## 2012-03-27 LAB — LIPASE, BLOOD: Lipase: 722 U/L — ABNORMAL HIGH (ref 11–59)

## 2012-03-27 MED ORDER — MORPHINE SULFATE 2 MG/ML IJ SOLN: 1.0000 mg | Freq: Once | INTRAMUSCULAR | Status: DC

## 2012-03-27 MED ORDER — LISINOPRIL 10 MG PO TABS
10.0000 mg | ORAL_TABLET | Freq: Every day | ORAL | Status: DC
  Administered 2012-03-27 – 2012-03-28 (×2): 10 mg via ORAL
  Filled 2012-03-27 (×3): qty 1

## 2012-03-27 MED ORDER — MORPHINE SULFATE 2 MG/ML IJ SOLN
1.0000 mg | Freq: Once | INTRAMUSCULAR | Status: AC
  Administered 2012-03-27: 1 mg via INTRAVENOUS

## 2012-03-27 MED ORDER — HYDRALAZINE HCL 10 MG PO TABS
10.0000 mg | ORAL_TABLET | Freq: Four times a day (QID) | ORAL | Status: DC | PRN
  Administered 2012-04-01 – 2012-04-02 (×3): 10 mg via ORAL
  Filled 2012-03-27 (×2): qty 1

## 2012-03-27 NOTE — Progress Notes (Signed)
[facility] PROGRESS NOTE  [name] [id number] DOB: 16-Oct-1955 DOA: 03/24/2012 PCP: Harvie Junior, MD  Brief narrative:  56 year-old female with history of hypertension, diabetes mellitus type 2, hepatitis C, diastolic CHF as per echo in 2009 presents with complaint of abdominal pain secondary to acute pancreatitis.   Assessment/Plan:   Principal Problem:   *Acute pancreatitis  No clear etiology. She denies heavy alcohol use and has had cholecystectomy almost 25 years ago. She is on multiple medications including HCTZ, metformin, and ranitidine which can cause acute pancreatitis.  -CT abdomen and pelvis suggestive off acute pancreatitis in the tail of pancreas.  - advance diet -When necessary Dilaudid for pain. Zofran when necessary for nausea and vomiting.  -lipase still high but trending down.  -Hold HCTZ, metformin and ranitidine.   Active Problems:   HTN (hypertension)  Continue  home dose of Coreg. Hold HCTZ. Prn hydralazine . Add lisinopril  DM2 (diabetes mellitus, type 2)  Holding metformin. Will On high dose of insulin at home. A1C of 7.5.   CHF (congestive heart failure)  - euvolemic. Monitor I/O. D/c fluids  Anemia  Currently stable. No previous workup. Labs suggests iron deficincy . B12 , TSH wnl. Needs outpt GI referral for colonoscopy  DVT prophylaxis: subcutaneous Lovenox   Code Status: Full code   Family Communication: None at bedside   Disposition Plan: Home once stable   Consultants:  None  Procedures:  None Antibiotics:  None  HPI/Subjective:  Still has pain over the epigastric and left upper quadrant area. Denies nausea or vomiting  Antibiotics:  none HPI/Subjective:  No nausea or vomiting, still has epigastric pain on movement but improving,   Objective: Filed Vitals:   03/26/12 1400 03/26/12 2130 03/27/12 0428 03/27/12 0813  BP: 141/75 188/79 177/73 160/87  Pulse: 70 70 74 75  Temp: 98.5 F (36.9 C) 97.4 F (36.3 C)  98.5 F (36.9 C)   TempSrc: Oral Oral Oral   Resp: 16 18 18    Height:      Weight:      SpO2: 97% 100% 98%     Intake/Output Summary (Last 24 hours) at 03/27/12 1034 Last data filed at 03/27/12 0600  Gross per 24 hour  Intake 2285.42 ml  Output   2400 ml  Net -114.58 ml   Filed Weights   03/25/12 0517 03/26/12 1314  Weight: 127.914 kg (282 lb) 127.869 kg (281 lb 14.4 oz)    Exam:     Data Reviewed: Basic Metabolic Panel:  Lab [id number] 0440 03/24/12 2155  NA 135 133*  K 3.7 3.8  CL 99 96  CO2 26 29  GLUCOSE 165* 255*  BUN 12 15  CREATININE 0.91 0.99  CALCIUM 8.3* 8.8  MG -- --  PHOS -- --   Liver Function Tests:  Lab 03/26/12 0440 03/24/12 2155  AST 17 16  ALT 13 14  ALKPHOS 87 92  BILITOT 0.5 0.5  PROT 6.9 7.3  ALBUMIN 3.0* 3.1*    Lab 03/27/12 0359 03/26/12 0440 03/25/12 0615 03/24/12 2155  LIPASE 722* 912* 1226* 1527*  AMYLASE -- -- -- --   No results found for this basename: AMMONIA:5 in the last 168 hours CBC:  Lab 03/26/12 0440 03/24/12 2155  WBC 6.4 6.9  NEUTROABS 3.6 3.7  HGB 10.6* 11.4*  HCT 33.0* 35.4*  MCV 83.3 82.3  PLT 242 259   Cardiac Enzymes: No results found for this basename: CKTOTAL:5,CKMB:5,CKMBINDEX:5,TROPONINI:5 in the last 168 hours BNP (last  3 results)  Basename 02/07/12 1800  PROBNP 83.0   CBG:  Lab 03/27/12 0716 03/26/12 2136 03/26/12 1551 03/26/12 1148 03/26/12 0754  GLUCAP 193* 141* 153* 158* 136*    No results found for this or any previous visit (from the past 240 hour(s)).   Studies: No results found.  Scheduled Meds:   . carvedilol  6.25 mg Oral BID WC  . doxazosin  2 mg Oral Daily  . enoxaparin (LOVENOX) injection  40 mg Subcutaneous Q24H  . influenza  inactive virus vaccine  0.5 mL Intramuscular Tomorrow-1000  . insulin aspart  0-9 Units Subcutaneous TID WC  . insulin glargine  15 Units Subcutaneous Daily  . pneumococcal 23 valent vaccine  0.5 mL Intramuscular Tomorrow-1000   Continuous  Infusions:   . sodium chloride 50 mL/hr at 03/26/12 1900      Time spent: 30 minutes    [name]  [facility] Pager [phone]. If 8PM-8AM, please contact night-coverage at [email or web address], password [facility] 03/27/2012, 10:34 AM  LOS: 3 days

## 2012-03-27 NOTE — Progress Notes (Signed)
Paged Dr. Laverle Patter that patient c/o excrutiating pain and had morphine at 80, MD calls back states ok for patient to have Morphine 1mg  IV

## 2012-03-27 NOTE — Progress Notes (Signed)
Paged Dr. Laverle Patter that patient c/o severe pain after dinner, does he want to move patient back to cliq

## 2012-03-27 NOTE — Progress Notes (Signed)
Family at bedside, patient states it is ok to talk about her diagnosis and her pain at this time with them. Spoke with patient and family about pancreatitis disease process, and bowel rest and pain management, need reinforcement at this time.

## 2012-03-27 NOTE — Progress Notes (Signed)
Paged Dr. Clementeen Graham to see if patient could have something po for pain

## 2012-03-28 LAB — GLUCOSE, CAPILLARY
Glucose-Capillary: 165 mg/dL — ABNORMAL HIGH (ref 70–99)
Glucose-Capillary: 133 mg/dL — ABNORMAL HIGH (ref 70–99)
Glucose-Capillary: 187 mg/dL — ABNORMAL HIGH (ref 70–99)
Glucose-Capillary: 170 mg/dL — ABNORMAL HIGH (ref 70–99)

## 2012-03-28 LAB — LIPASE, BLOOD: Lipase: 707 U/L — ABNORMAL HIGH (ref 11–59)

## 2012-03-28 NOTE — Progress Notes (Signed)
[facility] PROGRESS NOTE  [name] [id number] DOB: 08-14-1955 DOA: 03/24/2012 PCP: Harvie Junior, MD  Brief narrative:  56 year-old female with history of hypertension, diabetes mellitus type 2, hepatitis C, diastolic CHF as per echo in 2009 presents with complaint of abdominal pain secondary to acute pancreatitis.   Assessment/Plan:  Principal Problem:  *Acute pancreatitis  No clear etiology. She denies heavy alcohol use and has had cholecystectomy almost 25 years ago. She is on multiple medications including HCTZ, metformin, and ranitidine which can cause acute pancreatitis.  -CT abdomen and pelvis suggestive off acute pancreatitis in the tail of pancreas.  - Unable to tolerate advanced diet. Currently on clears  -When necessary Dilaudid for pain. Zofran when necessary for nausea and vomiting.  -lipase trending down very slowly -Hold HCTZ, metformin and ranitidine.  -I. will repeat CT abdomen tomorrow if pain continues to persist  Active Problems:  HTN (hypertension)  Continue home dose of Coreg. Hold HCTZ. Prn hydralazine . Add lisinopril   DM2 (diabetes mellitus, type 2)  Holding metformin. Will On high dose of insulin at home. A1C of 7.5.   CHF (congestive heart failure)  - euvolemic. Monitor I/O. D/c fluids   Anemia  Currently stable. No previous workup. Labs suggests iron deficincy . B12 , TSH wnl. Needs outpt GI referral for colonoscopy   DVT prophylaxis: subcutaneous Lovenox   Code Status: Full code  Family Communication: None at bedside  Disposition Plan: Home once stable   Consultants:  None    Procedures:  None   Antibiotics:  None    HPI/Subjective: Patient advanced diet but had epigastric pain overnight so placed back on clears. Still has epigastric pain off and on and improving very slowly.  Objective: Filed Vitals:   03/27/12 2147 03/27/12 2219 03/28/12 0131 03/28/12 0454  BP: 158/65   143/79  Pulse: 95   75  Temp: 100 F  (37.8 C) 99.9 F (37.7 C)  98.1 F (36.7 C)  TempSrc: Oral   Oral  Resp: 18   16  Height:      Weight:   126.826 kg (279 lb 9.6 oz)   SpO2: 95%   94%    Intake/Output Summary (Last 24 hours) at 03/28/12 1343 Last data filed at 03/28/12 1000  Gross per 24 hour  Intake    240 ml  Output   1825 ml  Net  -1585 ml   Filed Weights   03/25/12 0517 03/26/12 1314 03/28/12 0131  Weight: 127.914 kg (282 lb) 127.869 kg (281 lb 14.4 oz) 126.826 kg (279 lb 9.6 oz)    Exam:   General: Middle aged obese female lying in bed in no acute distress  HEENT: No pallor, moist oral mucosa  Chest: Clear to auscultation bilaterally, no added sounds  CVS: Normal S1 and S2, no murmurs  Abdomen: Soft, epigastric tenderness to palpation, bowel sounds present  Extremities: Warm, no edema  CNS: AAO x3  Data Reviewed: Basic Metabolic Panel:  Lab [id number] 0440 03/24/12 2155  NA 135 133*  K 3.7 3.8  CL 99 96  CO2 26 29  GLUCOSE 165* 255*  BUN 12 15  CREATININE 0.91 0.99  CALCIUM 8.3* 8.8  MG -- --  PHOS -- --   Liver Function Tests:  Lab 03/26/12 0440 03/24/12 2155  AST 17 16  ALT 13 14  ALKPHOS 87 92  BILITOT 0.5 0.5  PROT 6.9 7.3  ALBUMIN 3.0* 3.1*    Lab 03/28/12 0425 03/27/12 0359  03/26/12 0440 03/25/12 0615 03/24/12 2155  LIPASE 707* 722* 912* 1226* 1527*  AMYLASE -- -- -- -- --   No results found for this basename: AMMONIA:5 in the last 168 hours CBC:  Lab 03/26/12 0440 03/24/12 2155  WBC 6.4 6.9  NEUTROABS 3.6 3.7  HGB 10.6* 11.4*  HCT 33.0* 35.4*  MCV 83.3 82.3  PLT 242 259   Cardiac Enzymes: No results found for this basename: CKTOTAL:5,CKMB:5,CKMBINDEX:5,TROPONINI:5 in the last 168 hours BNP (last 3 results)  Basename 02/07/12 1800  PROBNP 83.0   CBG:  Lab 03/28/12 1213 03/28/12 0720 03/27/12 2140 03/27/12 1648 03/27/12 1210  GLUCAP 133* 165* 157* 189* 204*    No results found for this or any previous visit (from the past 240 hour(s)).    Studies: No results found.  Scheduled Meds:   . carvedilol  6.25 mg Oral BID WC  . doxazosin  2 mg Oral Daily  . enoxaparin (LOVENOX) injection  40 mg Subcutaneous Q24H  . influenza  inactive virus vaccine  0.5 mL Intramuscular Tomorrow-1000  . insulin aspart  0-9 Units Subcutaneous TID WC  . insulin glargine  15 Units Subcutaneous Daily  . lisinopril  10 mg Oral Daily  .  morphine injection  1 mg Intravenous Once  . DISCONTD:  morphine injection  1 mg Intravenous Once   Continuous Infusions:     Time spent: 30 minutes    Cherita Hebel  [facility] Pager [phone]. If 8PM-8AM, please contact night-coverage at [email or web address], password [facility] 03/28/2012, 1:43 PM  LOS: 4 days

## 2012-03-29 LAB — PRO B NATRIURETIC PEPTIDE: Pro B Natriuretic peptide (BNP): 94.3 pg/mL (ref 0–125)

## 2012-03-29 LAB — GLUCOSE, CAPILLARY
Glucose-Capillary: 151 mg/dL — ABNORMAL HIGH (ref 70–99)
Glucose-Capillary: 176 mg/dL — ABNORMAL HIGH (ref 70–99)
Glucose-Capillary: 256 mg/dL — ABNORMAL HIGH (ref 70–99)
Glucose-Capillary: 148 mg/dL — ABNORMAL HIGH (ref 70–99)

## 2012-03-29 LAB — LIPASE, BLOOD: Lipase: 561 U/L — ABNORMAL HIGH (ref 11–59)

## 2012-03-29 LAB — OCCULT BLOOD X 1 CARD TO LAB, STOOL: Fecal Occult Bld: NEGATIVE

## 2012-03-29 MED ORDER — OXYCODONE-ACETAMINOPHEN 5-325 MG PO TABS
1.0000 | ORAL_TABLET | ORAL | Status: DC | PRN
  Administered 2012-03-30 – 2012-04-02 (×8): 2 via ORAL
  Administered 2012-04-03: 1 via ORAL
  Filled 2012-03-29 (×8): qty 2
  Filled 2012-03-29: qty 1
  Filled 2012-03-29: qty 2

## 2012-03-29 MED ORDER — MORPHINE SULFATE 4 MG/ML IJ SOLN
4.0000 mg | INTRAMUSCULAR | Status: DC | PRN
  Administered 2012-03-29 (×2): 4 mg via INTRAVENOUS
  Filled 2012-03-29 (×3): qty 1

## 2012-03-29 MED ORDER — DIPHENHYDRAMINE HCL 25 MG PO CAPS
25.0000 mg | ORAL_CAPSULE | Freq: Four times a day (QID) | ORAL | Status: DC | PRN
  Administered 2012-03-30 (×3): 25 mg via ORAL
  Filled 2012-03-29 (×3): qty 1

## 2012-03-29 MED ORDER — HYDROMORPHONE HCL PF 2 MG/ML IJ SOLN: 2.0000 mg | INTRAMUSCULAR | Status: DC | PRN

## 2012-03-29 MED ORDER — LISINOPRIL 40 MG PO TABS
40.0000 mg | ORAL_TABLET | Freq: Every day | ORAL | Status: DC
  Administered 2012-03-29 – 2012-04-03 (×6): 40 mg via ORAL
  Filled 2012-03-29 (×6): qty 1

## 2012-03-29 NOTE — Progress Notes (Addendum)
[facility] PROGRESS NOTE  [name] [id number] DOB: August 01, 1955 DOA: 03/24/2012 PCP: Heather Junior, MD  Brief narrative:  56 year-old female with history of hypertension, diabetes mellitus type 2, hepatitis C, diastolic CHF as per echo in 2009 presents with complaint of abdominal pain secondary to acute pancreatitis.   Assessment/Plan:   Principal Problem:   *Acute pancreatitis  No clear etiology. She denies heavy alcohol use and has had cholecystectomy almost 25 years ago. She is on multiple medications including HCTZ, metformin, and ranitidine which can cause acute pancreatitis.  -CT abdomen and pelvis suggestive off acute pancreatitis in the tail of pancreas.  - Unable to tolerate advanced diet. Currently on clears . Improving very slowly. -When necessary morphine for pain. Could not tolerate Dilaudid because of itching. Zofran when necessary for nausea and vomiting.  -lipase trending down very slowly . -Hold HCTZ, metformin and ranitidine.   Active Problems:  HTN (hypertension)  Continue home dose of Coreg. Hold HCTZ. Prn hydralazine . Added lisinopril   DM2 (diabetes mellitus, type 2)  Holding metformin. Will On high dose of insulin at home. A1C of 7.5.   CHF (congestive heart failure)  - euvolemic. Monitor I/O. D/c fluids   Anemia  Currently stable. No previous workup. Labs suggests iron deficincy . B12 , TSH wnl. Needs outpt GI referral for colonoscopy   DVT prophylaxis: subcutaneous Lovenox   Code Status: Full code  Family Communication: None at bedside  Disposition Plan: Home once stable   Consultants:  None    Procedures:  None   Antibiotics:  None   HPI/Subjective: feels slightly better but still has some epigastric pain  Objective: Filed Vitals:   03/28/12 1400 03/28/12 2119 03/29/12 0519 03/29/12 0537  BP: 159/73 167/75  174/74  Pulse: 74 68  69  Temp: 98.3 F (36.8 C) 97.7 F (36.5 C)  98.3 F (36.8 C)  TempSrc: Oral Oral   Oral  Resp: 17 18  18   Height:      Weight:   126.7 kg (279 lb 5.2 oz)   SpO2: 94% 97%  99%    Intake/Output Summary (Last 24 hours) at 03/29/12 1149 Last data filed at 03/29/12 0900  Gross per 24 hour  Intake    360 ml  Output   1550 ml  Net  -1190 ml   Filed Weights   03/26/12 1314 03/28/12 0131 03/29/12 0519  Weight: 127.869 kg (281 lb 14.4 oz) 126.826 kg (279 lb 9.6 oz) 126.7 kg (279 lb 5.2 oz)    Exam:  General: Middle aged obese female lying in bed in no acute distress  HEENT: No pallor, moist oral mucosa  Chest: Clear to auscultation bilaterally, no added sounds  CVS: Normal S1 and S2, no murmurs  Abdomen: Soft, epigastric tenderness to palpation, bowel sounds present  Extremities: Warm, no edema  CNS: AAO x3   Data Reviewed: Basic Metabolic Panel:  Lab [id number] 0440 03/24/12 2155  NA 135 133*  K 3.7 3.8  CL 99 96  CO2 26 29  GLUCOSE 165* 255*  BUN 12 15  CREATININE 0.91 0.99  CALCIUM 8.3* 8.8  MG -- --  PHOS -- --   Liver Function Tests:  Lab 03/26/12 0440 03/24/12 2155  AST 17 16  ALT 13 14  ALKPHOS 87 92  BILITOT 0.5 0.5  PROT 6.9 7.3  ALBUMIN 3.0* 3.1*    Lab 03/29/12 0345 03/28/12 0425 03/27/12 0359 03/26/12 0440 03/25/12 0615  LIPASE 561* 707* 722* 912*  1226*  AMYLASE -- -- -- -- --   No results found for this basename: AMMONIA:5 in the last 168 hours CBC:  Lab 03/26/12 0440 03/24/12 2155  WBC 6.4 6.9  NEUTROABS 3.6 3.7  HGB 10.6* 11.4*  HCT 33.0* 35.4*  MCV 83.3 82.3  PLT 242 259   Cardiac Enzymes: No results found for this basename: CKTOTAL:5,CKMB:5,CKMBINDEX:5,TROPONINI:5 in the last 168 hours BNP (last 3 results)  Basename 03/29/12 0345 02/07/12 1800  PROBNP 94.3 83.0   CBG:  Lab 03/29/12 0725 03/28/12 2117 03/28/12 1730 03/28/12 1213 03/28/12 0720  GLUCAP 151* 170* 187* 133* 165*    No results found for this or any previous visit (from the past 240 hour(s)).   Studies: No results found.  Scheduled Meds:   .  carvedilol  6.25 mg Oral BID WC  . doxazosin  2 mg Oral Daily  . enoxaparin (LOVENOX) injection  40 mg Subcutaneous Q24H  . insulin aspart  0-9 Units Subcutaneous TID WC  . insulin glargine  15 Units Subcutaneous Daily  . lisinopril  40 mg Oral Daily  . DISCONTD: lisinopril  10 mg Oral Daily   Continuous Infusions:      Time spent: 25 minutes    [name]  [facility] Pager [phone]. If 8PM-8AM, please contact night-coverage at [email or web address], password [facility] 03/29/2012, 11:49 AM  LOS: 5 days

## 2012-03-30 LAB — GLUCOSE, CAPILLARY
Glucose-Capillary: 168 mg/dL — ABNORMAL HIGH (ref 70–99)
Glucose-Capillary: 189 mg/dL — ABNORMAL HIGH (ref 70–99)
Glucose-Capillary: 150 mg/dL — ABNORMAL HIGH (ref 70–99)
Glucose-Capillary: 194 mg/dL — ABNORMAL HIGH (ref 70–99)

## 2012-03-30 LAB — BASIC METABOLIC PANEL
BUN: 6 mg/dL (ref 6–23)
CO2: 26 mEq/L (ref 19–32)
Calcium: 8.6 mg/dL (ref 8.4–10.5)
Chloride: 100 mEq/L (ref 96–112)
Creatinine, Ser: 0.78 mg/dL (ref 0.50–1.10)
GFR calc Af Amer: >90 mL/min (ref >90)
GFR calc non Af Amer: >90 mL/min (ref >90)
Glucose, Bld: 204 mg/dL — ABNORMAL HIGH (ref 70–99)
Potassium: 3.8 mEq/L (ref 3.5–5.1)
Sodium: 135 mEq/L (ref 135–145)

## 2012-03-30 LAB — LIPASE, BLOOD: Lipase: 454 U/L — ABNORMAL HIGH (ref 11–59)

## 2012-03-30 MED ORDER — CARVEDILOL 12.5 MG PO TABS
12.5000 mg | ORAL_TABLET | Freq: Two times a day (BID) | ORAL | Status: DC
  Administered 2012-03-31 – 2012-04-01 (×3): 12.5 mg via ORAL
  Filled 2012-03-30 (×6): qty 1

## 2012-03-30 MED ORDER — ENOXAPARIN SODIUM 60 MG/0.6ML ~~LOC~~ SOLN
60.0000 mg | SUBCUTANEOUS | Status: DC
  Administered 2012-03-30 – 2012-04-02 (×4): 60 mg via SUBCUTANEOUS
  Filled 2012-03-30 (×5): qty 0.6

## 2012-03-30 MED ORDER — INSULIN ASPART PROT & ASPART (70-30 MIX) 100 UNIT/ML ~~LOC~~ SUSP
10.0000 [IU] | Freq: Two times a day (BID) | SUBCUTANEOUS | Status: DC
  Administered 2012-03-31 – 2012-04-02 (×5): 10 [IU] via SUBCUTANEOUS
  Filled 2012-03-30: qty 3

## 2012-03-30 NOTE — Progress Notes (Signed)
[facility] PROGRESS NOTE  LYNNIE [name] [id number] DOB: [birth year] DOA: 03/24/2012 PCP: Harvie Junior, MD  Assessment/Plan: 1. Acute pancreatitis--improving, tolerating liquids. Advance diet 9/19. Lipase slowly trending downwards. Etiology unclear. She was on multiple medications including HCTZ, metformin, and ranitidine which can cause acute pancreatitis. These are on hold.  2. Normocytic anemia--stable. Consider outpatient referral for colonoscopy because of family history. 3. DM type 2--stable. cannot afford insulin--discontinue Lantus. Will start 70/30. Will discuss 70/30 approx $28 at [facility]. 4. HTN--poor control, increase Coreg back to home dose, continue Cardura and lisinopril. 5. History of PUD 6. History of grade 2 diastolic CHF--discontinue metformin. Appears stable. 7. Hepatitis C  Code Status: full code Family Communication: none present Disposition Plan: home when improved  Murray Hodgkins, MD  [facility] Team 6 Pager [phone]. If 8PM-8AM, please contact night-coverage at [email or web address], password [facility] 03/30/2012, 6:53 PM  LOS: 6 days   Brief narrative: 56 year-old female with history of hypertension, diabetes mellitus2, hepatitis C, CHF diastolic last EF measured in 2009 was 60% presents with complaint of abdominal pain. Patient's abdominal pain initially was on the left upper quadrant and eventually became more epigastric over the last 4 days. The pain has been gradually worsening and radiating to the back. Has been having some nausea denies any vomiting. Had 2 episodes of diarrhea. Patient's pain worsens on eating. Since the pain is worsened and persisted now patient came to the ER. In the ER patient's lipase level was found to be high and has been admitted for further workup for pancreatitis.  Consultants:  None  Procedures:  None  HPI/Subjective: Afebrile, hypertensive. Feels much better, no abdominal pain or epigastric pain. No n/v, tolerating  liquids. Wants to try regular food.  Objective: Filed Vitals:   03/29/12 0537 03/29/12 2118 03/30/12 0617 03/30/12 1400  BP: 174/74 169/80 160/79 179/70  Pulse: 69 61 64 69  Temp: 98.3 F (36.8 C) 98.2 F (36.8 C) 98.2 F (36.8 C) 98.6 F (37 C)  TempSrc: Oral Oral Oral Oral  Resp: 18 18 16 15   Height:      Weight:      SpO2: 99% 97% 94% 96%    Intake/Output Summary (Last 24 hours) at 03/30/12 1853 Last data filed at 03/30/12 1800  Gross per 24 hour  Intake    300 ml  Output   1850 ml  Net  -1550 ml   Filed Weights   03/26/12 1314 03/28/12 0131 03/29/12 0519  Weight: 127.869 kg (281 lb 14.4 oz) 126.826 kg (279 lb 9.6 oz) 126.7 kg (279 lb 5.2 oz)    Exam:  General:  Appears calm and comfortable  Cardiovascular: RRR, no m/r/g. No LE edema.  Respiratory: CTA bilaterally, no w/r/r. Normal respiratory effort.  Abdomen: soft, ntnd  Psychiatric: grossly normal mood and affect, speech fluent and appropriate  Data Reviewed: Basic Metabolic Panel:  Lab [id number] 0345 03/26/12 0440 03/24/12 2155  NA 135 135 133*  K 3.8 3.7 3.8  CL 100 99 96  CO2 26 26 29   GLUCOSE 204* 165* 255*  BUN 6 12 15   CREATININE 0.78 0.91 0.99  CALCIUM 8.6 8.3* 8.8  MG -- -- --  PHOS -- -- --   Liver Function Tests:  Lab 03/26/12 0440 03/24/12 2155  AST 17 16  ALT 13 14  ALKPHOS 87 92  BILITOT 0.5 0.5  PROT 6.9 7.3  ALBUMIN 3.0* 3.1*    Lab 03/30/12 0345 03/29/12 0345 03/28/12 0425 03/27/12 0359 03/26/12 0440  LIPASE 454* 561* 707* 722* 912*  AMYLASE -- -- -- -- --   CBC:  Lab 03/26/12 0440 03/24/12 2155  WBC 6.4 6.9  NEUTROABS 3.6 3.7  HGB 10.6* 11.4*  HCT 33.0* 35.4*  MCV 83.3 82.3  PLT 242 259    Basename 03/29/12 0345 02/07/12 1800  PROBNP 94.3 83.0   CBG:  Lab 03/30/12 1729 03/30/12 1205 03/30/12 0720 03/29/12 2116 03/29/12 1720  GLUCAP 150* 189* 168* 148* 256*   Studies: No results found.  Scheduled Meds:   . carvedilol  6.25 mg Oral BID WC  .  doxazosin  2 mg Oral Daily  . enoxaparin (LOVENOX) injection  40 mg Subcutaneous Q24H  . insulin aspart  0-9 Units Subcutaneous TID WC  . insulin glargine  15 Units Subcutaneous Daily  . lisinopril  40 mg Oral Daily   Continuous Infusions:   Principal Problem:  *Acute pancreatitis Active Problems:  HTN (hypertension)  DM2 (diabetes mellitus, type 2)  CHF (congestive heart failure)  Anemia     Murray Hodgkins, MD  [facility] Team 6 Pager [phone]. If 8PM-8AM, please contact night-coverage at [email or web address], password [facility] 03/30/2012, 6:53 PM  LOS: 6 days   Time spent: 25 minutes

## 2012-03-30 NOTE — Progress Notes (Signed)
Event: Notified by RN that pt reports swelling to lower (R) eyelid that just started tonight. Prior to onset of swelling pt reported increased tearing at times to both eyes. NP to bedside. Subjective: Pt reports onset of swelling to (R) lower eyelid tonight that had been proceeded by slight increased tearing to both eyes. Denies any pain, or significant irritation to either eye. Pt denies any recent sinus infection or congestion but does state she was treated for "pink Eye" approx 1 mo ago but did not have this type of swelling nor has she had before. Objective: Noted obvious swelling to lower (R) eyelid. There is no erythema, exudate, firmness or TTP. No active excessive tearing. No injection to bil conjunctiva. No obvious lesions or nodules. Recent VS BP-182/83,  T-98.4, P-70,  R-18 w/ 02 sats of 98% on R/A.  Assessment/Plan: 1. Swelling to (R) lower eyelid: Etiology unclear though does not appear to be infectious or inflammatory in nature. Will hold off on antibiotics in favor of reassessment in am if no acute changes in assessment. Explained to pt who is agreeable.

## 2012-03-31 LAB — GLUCOSE, CAPILLARY
Glucose-Capillary: 167 mg/dL — ABNORMAL HIGH (ref 70–99)
Glucose-Capillary: 155 mg/dL — ABNORMAL HIGH (ref 70–99)
Glucose-Capillary: 154 mg/dL — ABNORMAL HIGH (ref 70–99)
Glucose-Capillary: 144 mg/dL — ABNORMAL HIGH (ref 70–99)

## 2012-03-31 LAB — LIPASE, BLOOD: Lipase: 250 U/L — ABNORMAL HIGH (ref 11–59)

## 2012-03-31 MED ORDER — POLYMYXIN B-TRIMETHOPRIM 10000-0.1 UNIT/ML-% OP SOLN
2.0000 [drp] | Freq: Four times a day (QID) | OPHTHALMIC | Status: DC
  Administered 2012-03-31 – 2012-04-03 (×10): 2 [drp] via OPHTHALMIC
  Filled 2012-03-31: qty 10

## 2012-03-31 NOTE — Progress Notes (Signed)
[facility] PROGRESS NOTE  HELIN AMMONS [id number] DOB: 1955/12/19 DOA: 03/24/2012 PCP: Harvie Junior, MD  Assessment/Plan: 1. Acute pancreatitis--lipase slowly trending downwards, no change in diet today given some epigastric discomfort. Etiology unclear. She was on multiple medications including HCTZ, metformin, and ranitidine which can cause acute pancreatitis. These are on hold.  2. Right eye edema--no evidence of infection or inflammation, suspect dependent edema from sleeping, monitor. 3. Normocytic anemia--stable. Consider outpatient referral for colonoscopy because of family history. 4. DM type 2--stable. Cannot afford insulin? Changed Lantus to 70/30. Will discuss 70/30 approx $28 at [facility]. 5. HTN--poor control, increased Coreg back to home dose 9/18, continue Cardura and lisinopril. 6. History of PUD 7. History of grade 2 diastolic CHF--discontinued metformin. Appears stable. 8. Hepatitis C  Code Status: full code Family Communication: none present Disposition Plan: home when improved  Murray Hodgkins, MD  [facility] Team 6 Pager [phone]. If 8PM-8AM, please contact night-coverage at [email or web address], password [facility] 03/31/2012, 11:58 AM  LOS: 7 days   Brief narrative: 56 year-old female with history of hypertension, diabetes mellitus2, hepatitis C, CHF diastolic last EF measured in 2009 was 60% presents with complaint of abdominal pain. Patient's abdominal pain initially was on the left upper quadrant and eventually became more epigastric over the last 4 days. The pain has been gradually worsening and radiating to the back. Has been having some nausea denies any vomiting. Had 2 episodes of diarrhea. Patient's pain worsens on eating. Since the pain is worsened and persisted now patient came to the ER. In the ER patient's lipase level was found to be high and has been admitted for further workup for  pancreatitis.  Consultants:  None  Procedures:  None  HPI/Subjective: Afebrile, hypertensive. BP meds changed yesterday. Some epigastric pain this am with grits (tolerated yesterday), feels like she needs to belch. Eye does not hurt, no injury, some itching and tearing  Objective: Filed Vitals:   03/30/12 1400 03/30/12 2207 03/31/12 0625 03/31/12 0849  BP: 179/70 182/83 180/90 180/90  Pulse: 69 70 65   Temp: 98.6 F (37 C) 98.4 F (36.9 C) 97.5 F (36.4 C)   TempSrc: Oral Oral Oral   Resp: 15 18 16    Height:      Weight:   126.191 kg (278 lb 3.2 oz)   SpO2: 96% 98% 97%     Intake/Output Summary (Last 24 hours) at 03/31/12 1158 Last data filed at 03/31/12 1000  Gross per 24 hour  Intake    200 ml  Output   2300 ml  Net  -2100 ml   Filed Weights   03/28/12 0131 03/29/12 0519 03/31/12 0625  Weight: 126.826 kg (279 lb 9.6 oz) 126.7 kg (279 lb 5.2 oz) 126.191 kg (278 lb 3.2 oz)    Exam:  General:  Appears calm and mildly uncomfortable  Cardiovascular: RRR, no m/r/g. No LE edema.  Respiratory: CTA bilaterally, no w/r/r. Normal respiratory effort.  Abdomen: soft, nd, some mild epigastric tenderness  Eye: left eye, upper and lower lid appear normal, right eye: upper lid appears normal, lower lid is edematous, non-tender, no erythema. Sclera unremarkable, no exudate; pupil and iris appears normal  Psychiatric: grossly normal mood and affect, speech fluent and appropriate  Data Reviewed: Basic Metabolic Panel:  Lab [id number] 0345 03/26/12 0440 03/24/12 2155  NA 135 135 133*  K 3.8 3.7 3.8  CL 100 99 96  CO2 26 26 29   GLUCOSE 204* 165* 255*  BUN 6 12 15  CREATININE 0.78 0.91 0.99  CALCIUM 8.6 8.3* 8.8  MG -- -- --  PHOS -- -- --   Liver Function Tests:  Lab 03/26/12 0440 03/24/12 2155  AST 17 16  ALT 13 14  ALKPHOS 87 92  BILITOT 0.5 0.5  PROT 6.9 7.3  ALBUMIN 3.0* 3.1*    Lab 03/31/12 0350 03/30/12 0345 03/29/12 0345 03/28/12 0425 03/27/12 0359   LIPASE 250* 454* 561* 707* 722*  AMYLASE -- -- -- -- --   CBC:  Lab 03/26/12 0440 03/24/12 2155  WBC 6.4 6.9  NEUTROABS 3.6 3.7  HGB 10.6* 11.4*  HCT 33.0* 35.4*  MCV 83.3 82.3  PLT 242 259    Basename 03/29/12 0345 02/07/12 1800  PROBNP 94.3 83.0   CBG:  Lab 03/31/12 0819 03/30/12 2203 03/30/12 1729 03/30/12 1205 03/30/12 0720  GLUCAP 167* 194* 150* 189* 168*   Studies: No results found.  Scheduled Meds:    . carvedilol  12.5 mg Oral BID WC  . doxazosin  2 mg Oral Daily  . enoxaparin (LOVENOX) injection  60 mg Subcutaneous Q24H  . insulin aspart  0-9 Units Subcutaneous TID WC  . insulin aspart protamine-insulin aspart  10 Units Subcutaneous BID WC  . lisinopril  40 mg Oral Daily  . DISCONTD: carvedilol  6.25 mg Oral BID WC  . DISCONTD: enoxaparin (LOVENOX) injection  40 mg Subcutaneous Q24H  . DISCONTD: insulin glargine  15 Units Subcutaneous Daily   Continuous Infusions:   Principal Problem:  *Acute pancreatitis Active Problems:  HTN (hypertension)  DM2 (diabetes mellitus, type 2)  CHF (congestive heart failure)  Anemia     Murray Hodgkins, MD  [facility] Team 6 Pager [phone]. If 8PM-8AM, please contact night-coverage at [email or web address], password [facility] 03/31/2012, 11:58 AM  LOS: 7 days   Time spent: 25 minutes

## 2012-03-31 NOTE — Progress Notes (Signed)
Pt's right lower eye noted swollen and sclera red.  Pt stated her right eye is starting to ache.  MD notified.  No new orders written.

## 2012-04-01 DIAGNOSIS — H109 Unspecified conjunctivitis: Secondary | ICD-10-CM

## 2012-04-01 LAB — GLUCOSE, CAPILLARY
Glucose-Capillary: 163 mg/dL — ABNORMAL HIGH (ref 70–99)
Glucose-Capillary: 198 mg/dL — ABNORMAL HIGH (ref 70–99)
Glucose-Capillary: 253 mg/dL — ABNORMAL HIGH (ref 70–99)
Glucose-Capillary: 188 mg/dL — ABNORMAL HIGH (ref 70–99)

## 2012-04-01 LAB — BASIC METABOLIC PANEL
BUN: 5 mg/dL — ABNORMAL LOW (ref 6–23)
CO2: 25 mEq/L (ref 19–32)
Calcium: 9.2 mg/dL (ref 8.4–10.5)
Chloride: 102 mEq/L (ref 96–112)
Creatinine, Ser: 0.75 mg/dL (ref 0.50–1.10)
GFR calc Af Amer: >90 mL/min (ref >90)
GFR calc non Af Amer: >90 mL/min (ref >90)
Glucose, Bld: 163 mg/dL — ABNORMAL HIGH (ref 70–99)
Potassium: 3.7 mEq/L (ref 3.5–5.1)
Sodium: 138 mEq/L (ref 135–145)

## 2012-04-01 LAB — LIPASE, BLOOD: Lipase: 233 U/L — ABNORMAL HIGH (ref 11–59)

## 2012-04-01 MED ORDER — CARVEDILOL 25 MG PO TABS
25.0000 mg | ORAL_TABLET | Freq: Two times a day (BID) | ORAL | Status: DC
  Administered 2012-04-01 – 2012-04-02 (×2): 25 mg via ORAL
  Filled 2012-04-01 (×4): qty 1

## 2012-04-01 NOTE — Progress Notes (Signed)
[facility] PROGRESS NOTE  [name] [id number] DOB: [birth year] DOA: 03/24/2012 PCP: Harvie Junior, MD  Assessment/Plan: 1. Acute pancreatitis--lipase trending downwards, tolerating full liquid diet without pain, advance to bland diet. Etiology unclear. She was on multiple medications including HCTZ, metformin, and ranitidine which can cause acute pancreatitis. These are on hold.  2. Right eye conjunctivitis--sclera now injected; also with L>U lid edema, non-tender. Continue abx drops. No evidence of cellulitis. 3. Normocytic anemia--stable. Consider outpatient referral for colonoscopy because of family history. 4. DM type 2--stable. Continue 70/30.  5. HTN--poor control, increase Coreg further, continue Cardura and lisinopril. 6. History of PUD 7. History of grade 2 diastolic CHF--discontinued metformin. Appears stable. Continue Coreg 8. Hepatitis C  Code Status: full code Family Communication: none present Disposition Plan: home when improved, possibly 48 hours.  Murray Hodgkins, MD  [facility] Team 6 Pager [phone]. If 8PM-8AM, please contact night-coverage at [email or web address], password [facility] 04/01/2012, 12:09 PM  LOS: 8 days   Brief narrative: 56 year-old female with history of hypertension, diabetes mellitus2, hepatitis C, CHF diastolic last EF measured in 2009 was 60% presents with complaint of abdominal pain. Patient's abdominal pain initially was on the left upper quadrant and eventually became more epigastric over the last 4 days. The pain has been gradually worsening and radiating to the back. Has been having some nausea denies any vomiting. Had 2 episodes of diarrhea. Patient's pain worsens on eating. Since the pain is worsened and persisted now patient came to the ER. In the ER patient's lipase level was found to be high and has been admitted for further workup for pancreatitis.  Consultants:  None  Procedures:  None  HPI/Subjective: More eyelid  edema last night, tearing, redness right eye, better this AM since antibiotics started. Tolerating liquid diet. Wants to advance diet, no abdominal pain now.  Objective: Filed Vitals:   03/31/12 2100 04/01/12 0500 04/01/12 0550 04/01/12 0941  BP: 165/65  190/84 190/78  Pulse: 63  85 72  Temp: 98.5 F (36.9 C)  98.3 F (36.8 C)   TempSrc: Oral  Oral   Resp: 17  16   Height:      Weight:  125.329 kg (276 lb 4.8 oz)    SpO2: 96%  98%     Intake/Output Summary (Last 24 hours) at 04/01/12 1209 Last data filed at 04/01/12 1100  Gross per 24 hour  Intake    720 ml  Output   1000 ml  Net   -280 ml   Filed Weights   03/29/12 0519 03/31/12 0625 04/01/12 0500  Weight: 126.7 kg (279 lb 5.2 oz) 126.191 kg (278 lb 3.2 oz) 125.329 kg (276 lb 4.8 oz)    Exam:  General:  Appears calm and comfortable, lying on right side when I entered room  Cardiovascular: RRR, no m/r/g. No LE edema.  Respiratory: CTA bilaterally, no w/r/r. Normal respiratory effort.  Abdomen: soft, nd, nontender  Eye: left eye, upper and lower lid appear normal, right eye: upper lid mildly edematous, lower lid is more edematous, non-tender, no erythema or fluctuance. Sclera injected, no exudate; pupil and iris appear normal  Psychiatric: grossly normal mood and affect, speech fluent and appropriate  Data Reviewed: Basic Metabolic Panel:  Lab [id number] 0408 03/30/12 0345 03/26/12 0440  NA 138 135 135  K 3.7 3.8 3.7  CL 102 100 99  CO2 25 26 26   GLUCOSE 163* 204* 165*  BUN 5* 6 12  CREATININE 0.75 0.78 0.91  CALCIUM  9.2 8.6 8.3*  MG -- -- --  PHOS -- -- --   Liver Function Tests:  Lab 03/26/12 0440  AST 17  ALT 13  ALKPHOS 87  BILITOT 0.5  PROT 6.9  ALBUMIN 3.0*    Lab 04/01/12 0408 03/31/12 0350 03/30/12 0345 03/29/12 0345 03/28/12 0425  LIPASE 233* 250* 454* 561* 707*  AMYLASE -- -- -- -- --   CBC:  Lab 03/26/12 0440  WBC 6.4  NEUTROABS 3.6  HGB 10.6*  HCT 33.0*  MCV 83.3  PLT 242     Basename 03/29/12 0345 02/07/12 1800  PROBNP 94.3 83.0   CBG:  Lab 04/01/12 0733 03/31/12 2147 03/31/12 1714 03/31/12 1304 03/31/12 0819  GLUCAP 163* 144* 154* 155* 167*   Studies: No results found.  Scheduled Meds:    . carvedilol  12.5 mg Oral BID WC  . doxazosin  2 mg Oral Daily  . enoxaparin (LOVENOX) injection  60 mg Subcutaneous Q24H  . insulin aspart  0-9 Units Subcutaneous TID WC  . insulin aspart protamine-insulin aspart  10 Units Subcutaneous BID WC  . lisinopril  40 mg Oral Daily  . trimethoprim-polymyxin b  2 drop Right Eye Q6H   Continuous Infusions:   Principal Problem:  *Acute pancreatitis Active Problems:  HTN (hypertension)  DM2 (diabetes mellitus, type 2)  CHF (congestive heart failure)  Anemia     Murray Hodgkins, MD  [facility] Team 6 Pager [phone]. If 8PM-8AM, please contact night-coverage at [email or web address], password [facility] 04/01/2012, 12:09 PM  LOS: 8 days   Time spent: 25 minutes

## 2012-04-01 NOTE — Progress Notes (Signed)
Information for needy med and 75% discount card given to patient.  Information for medication assistance for insulin given for patient to fill out and apply for free insulin.

## 2012-04-02 LAB — GLUCOSE, CAPILLARY
Glucose-Capillary: 203 mg/dL — ABNORMAL HIGH (ref 70–99)
Glucose-Capillary: 199 mg/dL — ABNORMAL HIGH (ref 70–99)
Glucose-Capillary: 293 mg/dL — ABNORMAL HIGH (ref 70–99)
Glucose-Capillary: 274 mg/dL — ABNORMAL HIGH (ref 70–99)
Glucose-Capillary: 182 mg/dL — ABNORMAL HIGH (ref 70–99)

## 2012-04-02 LAB — LIPASE, BLOOD: Lipase: 248 U/L — ABNORMAL HIGH (ref 11–59)

## 2012-04-02 MED ORDER — INSULIN ASPART PROT & ASPART (70-30 MIX) 100 UNIT/ML ~~LOC~~ SUSP
15.0000 [IU] | Freq: Two times a day (BID) | SUBCUTANEOUS | Status: DC
  Administered 2012-04-02 – 2012-04-03 (×2): 15 [IU] via SUBCUTANEOUS
  Filled 2012-04-02: qty 3

## 2012-04-02 MED ORDER — CARVEDILOL 12.5 MG PO TABS
12.5000 mg | ORAL_TABLET | Freq: Two times a day (BID) | ORAL | Status: DC
  Administered 2012-04-03: 12.5 mg via ORAL
  Filled 2012-04-02 (×3): qty 1

## 2012-04-02 NOTE — Progress Notes (Signed)
[facility] PROGRESS NOTE  [name] [id number] DOB: Aug 23, 1955 DOA: 03/24/2012 PCP: Harvie Junior, MD  Assessment/Plan: 1. Acute pancreatitis--feels better, tolerating solid diet without pain. Lipase with minimal change. Etiology unclear. She was on multiple medications including HCTZ, metformin, and ranitidine which can cause acute pancreatitis. These are on hold.  2. Right eye conjunctivitis--improved. Continue abx drops. No evidence of cellulitis. 3. Normocytic anemia--stable. Consider outpatient referral for colonoscopy because of family history. 4. DM type 2 Hgb A1c 7.5--fair control, increase 70/30.  5. HTN--improved control, continue Coreg, Cardura and lisinopril. 6. History of PUD 7. History of grade 2 diastolic CHF--discontinued metformin. Appears stable. Continue Coreg 8. Hepatitis C  Code Status: full code Family Communication: none present Disposition Plan: home 9/22 anticipated  Murray Hodgkins, MD  [facility] Team 6 Pager [phone]. If 8PM-8AM, please contact night-coverage at [email or web address], password [facility] 04/02/2012, 4:25 PM  LOS: 9 days   Brief narrative: 56 year-old female with history of hypertension, diabetes mellitus2, hepatitis C, CHF diastolic last EF measured in 2009 was 60% presents with complaint of abdominal pain. Patient's abdominal pain initially was on the left upper quadrant and eventually became more epigastric over the last 4 days. The pain has been gradually worsening and radiating to the back. Has been having some nausea denies any vomiting. Had 2 episodes of diarrhea. Patient's pain worsens on eating. Since the pain is worsened and persisted now patient came to the ER. In the ER patient's lipase level was found to be high and has been admitted for further workup for pancreatitis.  Consultants:  None  Procedures:  None  HPI/Subjective: Feels much better, less eye edema and discomfort. Eating well, no significant pain,  tolerating solid diet. Ambulating. Wants to go home soon.  Objective: Filed Vitals:   04/02/12 0203 04/02/12 0620 04/02/12 0853 04/02/12 1400  BP: 158/77 160/79 170/76 144/76  Pulse: 56 66 61 56  Temp: 98.4 F (36.9 C) 98.4 F (36.9 C)  98.9 F (37.2 C)  TempSrc: Oral Oral  Oral  Resp: 18 18  18   Height:      Weight:      SpO2: 99% 98%  96%    Intake/Output Summary (Last 24 hours) at 04/02/12 1625 Last data filed at 04/02/12 1000  Gross per 24 hour  Intake   1080 ml  Output   1450 ml  Net   -370 ml   Filed Weights   03/29/12 0519 03/31/12 0625 04/01/12 0500  Weight: 126.7 kg (279 lb 5.2 oz) 126.191 kg (278 lb 3.2 oz) 125.329 kg (276 lb 4.8 oz)    Exam:  General:  Appears calm and comfortable. Sitting in chair. Brigh affect.  Cardiovascular: RRR, no m/r/g.   Respiratory: CTA bilaterally, no w/r/r. Normal respiratory effort.  Abdomen: soft, nd, nontender  Eye: right eye: upper lid with minimal edema, lower lid less edematous, no erythema or fluctuance. Sclera less injected, no exudate; pupil and iris appear normal  Psychiatric: grossly normal mood and affect, speech fluent and appropriate  Data Reviewed: Basic Metabolic Panel:  Lab [id number] 03/30/12 0345  NA 138 135  K 3.7 3.8  CL 102 100  CO2 25 26  GLUCOSE 163* 204*  BUN 5* 6  CREATININE 0.75 0.78  CALCIUM 9.2 8.6  MG -- --  PHOS -- --   Liver Function Tests: No results found for this basename: AST:5,ALT:5,ALKPHOS:5,BILITOT:5,PROT:5,ALBUMIN:5 in the last 168 hours  Lab 04/02/12 0415 04/01/12 0408 03/31/12 0350 03/30/12 0345 03/29/12 0345  LIPASE 248* 233* 250* 454* 561*  AMYLASE -- -- -- -- --    Basename 03/29/12 0345 02/07/12 1800  PROBNP 94.3 83.0   CBG:  Lab 04/02/12 1217 04/02/12 0814 04/02/12 0150 04/01/12 2214 04/01/12 1626  GLUCAP 293* 199* 203* 188* 253*   Studies: No results found.  Scheduled Meds:    . carvedilol  25 mg Oral BID WC  . doxazosin  2 mg Oral Daily  .  enoxaparin (LOVENOX) injection  60 mg Subcutaneous Q24H  . insulin aspart  0-9 Units Subcutaneous TID WC  . insulin aspart protamine-insulin aspart  10 Units Subcutaneous BID WC  . lisinopril  40 mg Oral Daily  . trimethoprim-polymyxin b  2 drop Right Eye Q6H   Continuous Infusions:   Principal Problem:  *Acute pancreatitis Active Problems:  HTN (hypertension)  DM2 (diabetes mellitus, type 2)  CHF (congestive heart failure)  Anemia  Conjunctivitis     Murray Hodgkins, MD  [facility] Team 6 Pager [phone]. If 8PM-8AM, please contact night-coverage at [email or web address], password [facility] 04/02/2012, 4:25 PM  LOS: 9 days   Time spent: 25 minutes

## 2012-04-03 LAB — HEPATIC FUNCTION PANEL
ALT: 12 U/L (ref 0–35)
AST: 14 U/L (ref 0–37)
Albumin: 2.9 g/dL — ABNORMAL LOW (ref 3.5–5.2)
Alkaline Phosphatase: 79 U/L (ref 39–117)
Bilirubin, Direct: <0.1 mg/dL (ref 0.0–0.3)
Total Bilirubin: 0.2 mg/dL — ABNORMAL LOW (ref 0.3–1.2)
Total Protein: 6.8 g/dL (ref 6.0–8.3)

## 2012-04-03 LAB — GLUCOSE, CAPILLARY
Glucose-Capillary: 210 mg/dL — ABNORMAL HIGH (ref 70–99)
Glucose-Capillary: 243 mg/dL — ABNORMAL HIGH (ref 70–99)

## 2012-04-03 LAB — LIPASE, BLOOD: Lipase: 240 U/L — ABNORMAL HIGH (ref 11–59)

## 2012-04-03 MED ORDER — DOXAZOSIN MESYLATE 2 MG PO TABS: 2.0000 mg | ORAL_TABLET | Freq: Every day | ORAL | Status: DC

## 2012-04-03 MED ORDER — POLYMYXIN B-TRIMETHOPRIM 10000-0.1 UNIT/ML-% OP SOLN: 2.0000 [drp] | Freq: Four times a day (QID) | OPHTHALMIC | Status: DC

## 2012-04-03 MED ORDER — INSULIN NPH ISOPHANE & REGULAR (70-30) 100 UNIT/ML ~~LOC~~ SUSP: 20.0000 [IU] | Freq: Two times a day (BID) | SUBCUTANEOUS | Status: DC

## 2012-04-03 MED ORDER — OXYCODONE-ACETAMINOPHEN 5-325 MG PO TABS: 1.0000 | ORAL_TABLET | ORAL | Status: DC | PRN

## 2012-04-03 MED ORDER — LISINOPRIL 40 MG PO TABS: 40.0000 mg | ORAL_TABLET | Freq: Every day | ORAL | Status: DC

## 2012-04-03 MED ORDER — INSULIN ASPART PROT & ASPART (70-30 MIX) 100 UNIT/ML ~~LOC~~ SUSP: 20.0000 [IU] | Freq: Two times a day (BID) | SUBCUTANEOUS | Status: DC

## 2012-04-03 NOTE — Discharge Summary (Addendum)
Physician Discharge Summary  [name] [id number] DOB: [birth year] DOA: 03/24/2012  PCP: Heather Junior, MD  Admit date: 03/24/2012 Discharge date: 04/03/2012  Recommendations for Outpatient Follow-up:  1. Follow-up resolution of acute pancreatitis 2. Follow-up resolution of right eye conjunctivitis 3. Follow-up hypertension 4. See below in regard to metformin  5. Consider outpatient colonoscopy (family history of colon cancer)  Follow-up Information    Follow up with Heather Junior, MD. In 1 week.   Contact information:   [address] [place] [address] [phone]         Discharge Diagnoses:  1. Acute pancreatitis 2. Right eye conjunctivitis 3. Normocytic anemia 4. DM type 2 Hgb A1c 7.5 5. HTN 6. History of PUD  7. History of grade 2 diastolic CHF 8. Hepatitis C  Discharge Condition: improved Disposition: home  Diet recommendation: bland, low-fat diet  Filed Weights   03/29/12 0519 03/31/12 0625 04/01/12 0500  Weight: 126.7 kg (279 lb 5.2 oz) 126.191 kg (278 lb 3.2 oz) 125.329 kg (276 lb 4.8 oz)    History of present illness:  56 year-old female with history of hypertension, diabetes mellitus2, hepatitis C, CHF diastolic last EF measured in 2009 was 60% presents with complaint of abdominal pain. Patient's abdominal pain initially was on the left upper quadrant and eventually became more epigastric over the last 4 days. In the ER patient's lipase level was found to be high and has been admitted for further workup for pancreatitis.  Hospital Course:  [name] was admitted for treatment of acute pancreatitis (by lipase and CT imaging). This was very slow to resolve, required prolonged restricted diet and etiology remains unclear (denied significant alcohol use, lipid panel was unremarkable). HCTZ and ranitidine were discontinued as they can cause pancreatitis. Currently patient is pain free and stable for discharge. This and other issues as  below.  1. Acute pancreatitis--resolved, tolerating solid diet without pain, nausea or vomiting. Lipase with slight decrease. Etiology unclear. She was on multiple medications including HCTZ and ranitidine which can cause acute pancreatitis (metformin not known to cause). These are on hold.    2. Right eye conjunctivitis--rapidly improving. Continue abx drops. No evidence of cellulitis.  3. Normocytic anemia--stable. Consider outpatient referral for colonoscopy because of family history.  4. DM type 2 Hgb A1c 7.5--fair control, increase 70/30 on discharge.    5. HTN--stable, continue Coreg, Cardura and lisinopril.  6. History of grade 2 diastolic CHF--Asymptomatic, was not on loop diuretic. Patient wants to continue metformin, as she has been on this long-term without incident (by records at least since 2008). Continue Coreg. 7. Hepatitis C  Consultants:  None  Procedures:  None  Discharge Instructions  Discharge Orders    Future Orders Please Complete By Expires   Diet - low sodium heart healthy      Discharge instructions      Comments:   Be sure to finish antibiotic eye drops. Stay on low fat diet until seen by Dr. Jimmye [name]. Call physician or seek immediate medical attention for abdominal pain, nausea, vomiting or worsening of condition.   Activity as tolerated - No restrictions          Medication List     As of 04/03/2012  4:07 PM    STOP taking these medications         hydrochlorothiazide 25 MG tablet   Commonly known as: HYDRODIURIL      insulin NPH-insulin regular (70-30) 100 UNIT/ML injection   Commonly known as:  NOVOLIN 70/30      ranitidine 300 MG capsule   Commonly known as: ZANTAC      TAKE these medications         aspirin 325 MG EC tablet   Take 325 mg by mouth daily.      carvedilol 12.5 MG tablet   Commonly known as: COREG   Take 12.5 mg by mouth 2 (two) times daily with a meal.      doxazosin 2 MG tablet   Commonly known as: CARDURA   Take 1  tablet (2 mg total) by mouth daily.      insulin aspart protamine-insulin aspart (70-30) 100 UNIT/ML injection   Commonly known as: NOVOLOG 70/30   Inject 20 Units into the skin 2 (two) times daily with a meal.      lisinopril 40 MG tablet   Commonly known as: PRINIVIL,ZESTRIL   Take 1 tablet (40 mg total) by mouth daily.      metFORMIN 1000 MG tablet   Commonly known as: GLUCOPHAGE   Take 1,000 mg by mouth 2 (two) times daily with a meal.      oxyCODONE-acetaminophen 5-325 MG per tablet   Commonly known as: PERCOCET/ROXICET   Take 1-2 tablets by mouth every 4 (four) hours as needed (severe pain).      trimethoprim-polymyxin b ophthalmic solution   Commonly known as: POLYTRIM   Place 2 drops into the right eye every 6 (six) hours. Last day 9/27            Follow-up Information    Follow up with Heather Junior, MD. In 1 week.   Contact information:   [address] [address] [phone]           The results of significant diagnostics from this hospitalization (including imaging, microbiology, ancillary and laboratory) are listed below for reference.    Significant Diagnostic Studies: Dg Chest 2 View  03/24/2012  *RADIOLOGY REPORT*  Clinical Data: Chest pain, shortness of breath and abdominal pain.  CHEST - 2 VIEW  Comparison: 02/07/2012.  Findings: Stable borderline enlarged cardiac silhouette and mildly prominent pulmonary vasculature.  Stable minimal linear scarring at the right lung base.  The interstitial markings remain minimally prominent.  Unremarkable bones.  Probable cholecystectomy clips.  IMPRESSION:  1.  No acute abnormality. 2.  Stable borderline cardiomegaly, mild pulmonary vascular congestion and minimal chronic interstitial lung disease.   Original Report Authenticated By: [name], M.D.    Ct Abdomen Pelvis W Contrast  03/25/2012  *RADIOLOGY REPORT*  Clinical Data: Pancreatitis, mid abdominal pain, follow-up  CT ABDOMEN AND PELVIS  WITH CONTRAST  Technique:  Multidetector CT imaging of the abdomen and pelvis was performed following the standard protocol during bolus administration of intravenous contrast.  Contrast: 153mL OMNIPAQUE IOHEXOL 300 MG/ML  SOLN  Comparison:   CT abdomen pelvis of 04/11/2010  Findings: There is bibasilar linear atelectasis in both lower lobes.  The liver is slightly prominent and surgical clips are present from prior cholecystectomy.  The pancreas is normal in the head and body with some prominence of the distal body and tail. Around distal bony tail there is a small amount of exudate consistent with pancreatitis, but no pseudocyst formation is seen. The adrenal glands and spleen are unremarkable.  The stomach is not well distended.  The kidneys enhance and there are small low attenuation structures most consistent with cysts, but too small to characterize on the present study.  The abdominal  aorta is normal in caliber.  A few mesenteric and retroperitoneal nodes present which are not pathologically enlarged.  The urinary bladder is not well distended.  The uterus is normal in size.  Only a tiny amount of free fluid is present in the pelvis. No bony abnormality is seen.  The terminal ileum and the appendix are unremarkable. No bony abnormality is seen.  IMPRESSION:  1.  Findings compatible with pancreatitis of the distal body and tail of the pancreas with a small amount of surrounding exudate. No pseudocyst. 2.  Bibasilar linear atelectasis. 3.  Slight prominence of the liver.   Original Report Authenticated By: Joretta Bachelor, M.D.    Labs: Basic Metabolic Panel:  Lab [id number] 03/30/12 0345  NA 138 135  K 3.7 3.8  CL 102 100  CO2 25 26  GLUCOSE 163* 204*  BUN 5* 6  CREATININE 0.75 0.78  CALCIUM 9.2 8.6  MG -- --  PHOS -- --   Liver Function Tests:  Lab 04/03/12 0350  AST 14  ALT 12  ALKPHOS 79  BILITOT 0.2*  PROT 6.8  ALBUMIN 2.9*    Lab 04/03/12 0350 04/02/12 0415 04/01/12 0408  03/31/12 0350 03/30/12 0345  LIPASE 240* 248* 233* 250* 454*  AMYLASE -- -- -- -- --    Basename 03/29/12 0345 02/07/12 1800  PROBNP 94.3 83.0   CBG:  Lab 04/03/12 1209 04/03/12 0745 04/02/12 2020 04/02/12 1710 04/02/12 1217  GLUCAP 243* 210* 182* 274* 293*    Principal Problem:  *Acute pancreatitis Active Problems:  HTN (hypertension)  DM2 (diabetes mellitus, type 2)  CHF (congestive heart failure)  Anemia  Conjunctivitis   Time coordinating discharge: 35 minutes Signed:  Murray Hodgkins, MD [facility] 04/03/2012, 2:37 PM

## 2012-04-03 NOTE — Progress Notes (Signed)
Assessment unchanged. Pt verbalized understanding of dc instructions through teach back. Reviewed meds to resume. Dr. Sarajane Jews changed home insulin from Novolin to Novolog prior to dc and instructed RN to provide insulin vial in med drawer for home use. Script x1 given as provided by MD.  Pt discharged via wheelchair to front entrance to meet awaiting vehicle to carry home. Accompanied by NT and daughter.

## 2012-04-03 NOTE — Progress Notes (Signed)
[facility] PROGRESS NOTE  [name] [id number] DOB: [birth year] DOA: 03/24/2012 PCP: Harvie Junior, MD  Assessment/Plan: 1. Acute pancreatitis--appears resolved, tolerating solid diet without pain, nausea or vomiting. Lipase with slight decrease. Etiology unclear. She was on multiple medications including HCTZ and ranitidine which can cause acute pancreatitis (metformin not known to cause). These are on hold.  2. Right eye conjunctivitis--rapidly improving. Continue abx drops. No evidence of cellulitis. 3. Normocytic anemia--stable. Consider outpatient referral for colonoscopy because of family history. 4. DM type 2 Hgb A1c 7.5--fair control, increase 70/30 on discharge.  5. HTN--stable, continue Coreg, Cardura and lisinopril. 6. History of PUD 7. History of grade 2 diastolic CHF--Asymptomatic, was not on loop diuretic. Patient wants to continue metformin, as she has been on this long-term without incident. Continue Coreg 8. Hepatitis C  Code Status: full code Family Communication: none present Disposition Plan: home   Murray Hodgkins, MD  [facility] Team 6 Pager [phone]. If 8PM-8AM, please contact night-coverage at [email or web address], password [facility] 04/03/2012, 2:18 PM  LOS: 10 days   Brief narrative: 56 year-old female with history of hypertension, diabetes mellitus2, hepatitis C, CHF diastolic last EF measured in 2009 was 60% presents with complaint of abdominal pain. Patient's abdominal pain initially was on the left upper quadrant and eventually became more epigastric over the last 4 days. The pain has been gradually worsening and radiating to the back. Has been having some nausea denies any vomiting. Had 2 episodes of diarrhea. Patient's pain worsens on eating. Since the pain is worsened and persisted now patient came to the ER. In the ER patient's lipase level was found to be high and has been admitted for further workup for  pancreatitis.  Consultants:  None  Procedures:  None  HPI/Subjective: Feels better, right eye much improved. No abdominal pain, n/v. Feels well and eating well. Wants to go home.  Objective: Filed Vitals:   04/02/12 2200 04/03/12 0600 04/03/12 0847 04/03/12 1037  BP: 168/92 160/75 175/86 161/80  Pulse: 71 57 60   Temp: 98.1 F (36.7 C) 98 F (36.7 C)    TempSrc: Oral Oral    Resp: 20 16    Height:      Weight:      SpO2: 100% 95%      Intake/Output Summary (Last 24 hours) at 04/03/12 1418 Last data filed at 04/03/12 1100  Gross per 24 hour  Intake    240 ml  Output   1200 ml  Net   -960 ml   Filed Weights   03/29/12 0519 03/31/12 0625 04/01/12 0500  Weight: 126.7 kg (279 lb 5.2 oz) 126.191 kg (278 lb 3.2 oz) 125.329 kg (276 lb 4.8 oz)    Exam:  General:  Appears calm and comfortable.   Cardiovascular: RRR, no m/r/g.   Respiratory: CTA bilaterally, no w/r/r. Normal respiratory effort.  Abdomen: soft, nd, nontender  Eye: right eye: upper lid appears normal, lower lid trace edema, no erythema or fluctuance. Sclera white, no exudate  Psychiatric: grossly normal mood and affect, speech fluent and appropriate  Data Reviewed: Basic Metabolic Panel:  Lab [id number] 03/30/12 0345  NA 138 135  K 3.7 3.8  CL 102 100  CO2 25 26  GLUCOSE 163* 204*  BUN 5* 6  CREATININE 0.75 0.78  CALCIUM 9.2 8.6  MG -- --  PHOS -- --   Liver Function Tests:  Lab 04/03/12 0350  AST 14  ALT 12  ALKPHOS 79  BILITOT 0.2*  PROT 6.8  ALBUMIN 2.9*    Lab 04/03/12 0350 04/02/12 0415 04/01/12 0408 03/31/12 0350 03/30/12 0345  LIPASE 240* 248* 233* 250* 454*  AMYLASE -- -- -- -- --    Basename 03/29/12 0345 02/07/12 1800  PROBNP 94.3 83.0   CBG:  Lab 04/03/12 1209 04/03/12 0745 04/02/12 2020 04/02/12 1710 04/02/12 1217  GLUCAP 243* 210* 182* 274* 293*   Studies: No results found.  Scheduled Meds:    . carvedilol  12.5 mg Oral BID WC  . doxazosin  2 mg  Oral Daily  . enoxaparin (LOVENOX) injection  60 mg Subcutaneous Q24H  . insulin aspart  0-9 Units Subcutaneous TID WC  . insulin aspart protamine-insulin aspart  15 Units Subcutaneous BID WC  . lisinopril  40 mg Oral Daily  . trimethoprim-polymyxin b  2 drop Right Eye Q6H  . DISCONTD: carvedilol  25 mg Oral BID WC  . DISCONTD: insulin aspart protamine-insulin aspart  10 Units Subcutaneous BID WC   Continuous Infusions:   Principal Problem:  *Acute pancreatitis Active Problems:  HTN (hypertension)  DM2 (diabetes mellitus, type 2)  CHF (congestive heart failure)  Anemia  Conjunctivitis     Murray Hodgkins, MD  [facility] Team 6 Pager [phone]. If 8PM-8AM, please contact night-coverage at [email or web address], password [facility] 04/03/2012, 2:18 PM  LOS: 10 days

## 2012-05-14 ENCOUNTER — Encounter (HOSPITAL_COMMUNITY): Payer: Self-pay | Admitting: Family Medicine

## 2012-05-14 ENCOUNTER — Emergency Department (HOSPITAL_COMMUNITY): Payer: Self-pay

## 2012-05-14 ENCOUNTER — Emergency Department (HOSPITAL_COMMUNITY)
Admission: EM | Admit: 2012-05-14 | Discharge: 2012-05-14 | Disposition: A | Discharge status: Home / Self Care | Payer: Self-pay | Attending: Emergency Medicine | Admitting: Emergency Medicine

## 2012-05-14 DIAGNOSIS — E119 Type 2 diabetes mellitus without complications: Secondary | ICD-10-CM | POA: Insufficient documentation

## 2012-05-14 DIAGNOSIS — Z79899 Other long term (current) drug therapy: Secondary | ICD-10-CM | POA: Insufficient documentation

## 2012-05-14 DIAGNOSIS — IMO0002 Reserved for concepts with insufficient information to code with codable children: Secondary | ICD-10-CM | POA: Insufficient documentation

## 2012-05-14 DIAGNOSIS — Z7982 Long term (current) use of aspirin: Secondary | ICD-10-CM | POA: Insufficient documentation

## 2012-05-14 DIAGNOSIS — Z87891 Personal history of nicotine dependence: Secondary | ICD-10-CM | POA: Insufficient documentation

## 2012-05-14 DIAGNOSIS — I509 Heart failure, unspecified: Secondary | ICD-10-CM | POA: Insufficient documentation

## 2012-05-14 DIAGNOSIS — Z8709 Personal history of other diseases of the respiratory system: Secondary | ICD-10-CM | POA: Insufficient documentation

## 2012-05-14 DIAGNOSIS — M1710 Unilateral primary osteoarthritis, unspecified knee: Secondary | ICD-10-CM | POA: Insufficient documentation

## 2012-05-14 DIAGNOSIS — M1990 Unspecified osteoarthritis, unspecified site: Secondary | ICD-10-CM

## 2012-05-14 DIAGNOSIS — I10 Essential (primary) hypertension: Secondary | ICD-10-CM | POA: Insufficient documentation

## 2012-05-14 DIAGNOSIS — Z8619 Personal history of other infectious and parasitic diseases: Secondary | ICD-10-CM | POA: Insufficient documentation

## 2012-05-14 MED ORDER — HYDROCODONE-ACETAMINOPHEN 5-325 MG PO TABS: 2.0000 | ORAL_TABLET | ORAL | Status: DC | PRN

## 2012-05-14 MED ORDER — IBUPROFEN 600 MG PO TABS: 600.0000 mg | ORAL_TABLET | Freq: Four times a day (QID) | ORAL | Status: DC | PRN

## 2012-05-14 NOTE — ED Notes (Addendum)
Pt states she has had pain in her knee that radiates to her calf and up her leg since last Friday. Pt states "I fell one month ago, off the bed". Pt states, "I can't just get into the car, I have to lift my leg to get in" Pt denies hx of arthritis. Pt states "I took one percocet at home and it put me to sleep". Knee is painful to palpation, no edema, no redness, no warmth of area.

## 2012-05-14 NOTE — ED Notes (Signed)
Per pt left knee pain radiating down lower leg since last Friday. sts tried OTC meds without relief.

## 2012-05-14 NOTE — ED Provider Notes (Signed)
History  Scribed for Dot Lanes, MD, the patient was seen in room TR04C/TR04C. This chart was scribed by Truddie Coco. The patient's care started at 5:31 PM   CSN: [id number]  Arrival date & time 05/14/12  1626   First MD Initiated Contact with Patient 05/14/12 1730      Chief Complaint  Patient presents with  . Knee Pain     The history is provided by the patient. No language interpreter was used.   MASEL HANING is a 56 y.o. female who presents to the Emergency Department complaining of left knee pain that started about one week ago and has gotten worse.  Pt states that walking makes the pain worse.  She denies any discoloration or swelling.  She has taken ibuprofen with no relief.     Past Medical History  Diagnosis Date  . CHF (congestive heart failure)   . Bronchitis   . Diabetes mellitus   . Hypertension   . Ulcer   . Hepatitis C     Past Surgical History  Procedure Date  . Tubal ligation   . Cholecystectomy     Family History  Problem Relation Age of Onset  . Colon cancer Mother     History  Substance Use Topics  . Smoking status: Former Research scientist (life sciences)  . Smokeless tobacco: Not on file  . Alcohol Use: Yes     ocassionally    OB History    Grav Para Term Preterm Abortions TAB SAB Ect Mult Living                  Review of Systems All other systems reviewed and ar Allergies  Morphine and related and Shellfish allergy  Home Medications   Current Outpatient Rx  Name Route Sig Dispense Refill  . ASPIRIN 325 MG PO TBEC Oral Take 325 mg by mouth daily.    [name] CARVEDILOL 12.5 MG PO TABS Oral Take 12.5 mg by mouth 2 (two) times daily with a meal.    . DOXAZOSIN MESYLATE 2 MG PO TABS Oral Take 1 tablet (2 mg total) by mouth daily. 30 tablet 0  . INSULIN ASPART PROT & ASPART (70-30) 100 UNIT/ML [place] SUSP Subcutaneous Inject 50 Units into the skin 2 (two) times daily with a meal.    . LISINOPRIL 40 MG PO TABS Oral Take 1 tablet (40 mg total) by mouth daily. 30  tablet 0  . METFORMIN HCL 1000 MG PO TABS Oral Take 1,000 mg by mouth 2 (two) times daily with a meal.    . OXYCODONE-ACETAMINOPHEN 5-325 MG PO TABS Oral Take 1-2 tablets by mouth every 4 (four) hours as needed (severe pain). 15 tablet 0  . POLYMYXIN B-TRIMETHOPRIM 10000-0.1 UNIT/ML-% OP SOLN Right Eye Place 2 drops into the right eye every 6 (six) hours. Last day 9/27    . HYDROCODONE-ACETAMINOPHEN 5-325 MG PO TABS Oral Take 2 tablets by mouth every 4 (four) hours as needed for pain. 10 tablet 0  . IBUPROFEN 600 MG PO TABS Oral Take 1 tablet (600 mg total) by mouth every 6 (six) hours as needed for pain. 30 tablet 0    BP 147/56  Pulse 67  Temp 98.7 F (37.1 C) (Oral)  Resp 16  SpO2 98%  Physical Exam  Nursing note and vitals reviewed. Constitutional: She is oriented to person, place, and time. She appears well-developed and well-nourished. No distress.  HENT:  Head: Normocephalic and atraumatic.  Eyes: Pupils are equal, round, and  reactive to light.  Neck: Normal range of motion.  Pulmonary/Chest: No respiratory distress.  Abdominal: Normal appearance. She exhibits no distension.  Musculoskeletal:       Left knee: She exhibits decreased range of motion. tenderness found.  Neurological: She is alert and oriented to person, place, and time. No cranial nerve deficit.  Skin: Skin is warm and dry. No rash noted.  Psychiatric: She has a normal mood and affect. Her behavior is normal.    ED Course  Procedures  DIAGNOSTIC STUDIES:   COORDINATION OF CARE:  16:43 Ordered: DG Knee Complete 4 Views Left    Labs Reviewed - No data to display Dg Knee Complete 4 Views Left  05/14/2012  *RADIOLOGY REPORT*  Clinical Data: Left knee pain.  LEFT KNEE - COMPLETE 4+ VIEW  Comparison: 02/17/2004.  Findings: There are mild tricompartmental degenerative changes.  No acute fracture or osteochondral lesion.  No definite joint effusion.  IMPRESSION: Tricompartmental degenerative changes but no  acute bony findings or joint effusion.   Original Report Authenticated By: Marijo Sanes, M.D.      1. DJD (degenerative joint disease)       MDM  I personally performed the services described in this documentation, which was scribed in my presence. The recorded information has been reviewed and considered.         Dot Lanes, MD 05/14/12 [phone]

## 2012-05-17 ENCOUNTER — Emergency Department (HOSPITAL_COMMUNITY)
Admission: EM | Admit: 2012-05-17 | Discharge: 2012-05-17 | Disposition: A | Discharge status: Home / Self Care | Payer: Self-pay | Attending: Emergency Medicine | Admitting: Emergency Medicine

## 2012-05-17 ENCOUNTER — Encounter (HOSPITAL_COMMUNITY): Payer: Self-pay | Admitting: Physical Medicine and Rehabilitation

## 2012-05-17 DIAGNOSIS — Z7982 Long term (current) use of aspirin: Secondary | ICD-10-CM | POA: Insufficient documentation

## 2012-05-17 DIAGNOSIS — I10 Essential (primary) hypertension: Secondary | ICD-10-CM | POA: Insufficient documentation

## 2012-05-17 DIAGNOSIS — M1712 Unilateral primary osteoarthritis, left knee: Secondary | ICD-10-CM

## 2012-05-17 DIAGNOSIS — I509 Heart failure, unspecified: Secondary | ICD-10-CM | POA: Insufficient documentation

## 2012-05-17 DIAGNOSIS — Z794 Long term (current) use of insulin: Secondary | ICD-10-CM | POA: Insufficient documentation

## 2012-05-17 DIAGNOSIS — M1710 Unilateral primary osteoarthritis, unspecified knee: Secondary | ICD-10-CM | POA: Insufficient documentation

## 2012-05-17 DIAGNOSIS — E119 Type 2 diabetes mellitus without complications: Secondary | ICD-10-CM | POA: Insufficient documentation

## 2012-05-17 DIAGNOSIS — B1920 Unspecified viral hepatitis C without hepatic coma: Secondary | ICD-10-CM | POA: Insufficient documentation

## 2012-05-17 DIAGNOSIS — Z87891 Personal history of nicotine dependence: Secondary | ICD-10-CM | POA: Insufficient documentation

## 2012-05-17 DIAGNOSIS — Z79899 Other long term (current) drug therapy: Secondary | ICD-10-CM | POA: Insufficient documentation

## 2012-05-17 DIAGNOSIS — J40 Bronchitis, not specified as acute or chronic: Secondary | ICD-10-CM | POA: Insufficient documentation

## 2012-05-17 MED ORDER — OXYCODONE-ACETAMINOPHEN 5-325 MG PO TABS: 1.0000 | ORAL_TABLET | ORAL | Status: DC | PRN

## 2012-05-17 MED ORDER — OXYCODONE-ACETAMINOPHEN 5-325 MG PO TABS
1.0000 | ORAL_TABLET | Freq: Once | ORAL | Status: AC
  Administered 2012-05-17: 1 via ORAL
  Filled 2012-05-17: qty 1

## 2012-05-17 NOTE — ED Notes (Signed)
Ortho tech at bedside 

## 2012-05-17 NOTE — ED Provider Notes (Signed)
History  Scribed for Mylinda Latina III, MD, the patient was seen in room TR08C/TR08C. This chart was scribed by Truddie Coco. The patient's care started at 12:51 PM   CSN: [id number]  Arrival date & time 05/17/12  1109   First MD Initiated Contact with Patient 05/17/12 1246      Chief Complaint  Patient presents with  . Leg Pain     The history is provided by the patient. No language interpreter was used.   [name] is a 56 y.o. female who presents to the Emergency Department complaining of left leg pain and left knee pain that started about two weeks ago.  Pt was seen in the ED four days ago.  She states that the pain has gotten worse since then.  She denies any new injuries.  Putting weight on the leg makes the pain worse.  She has taken Vicodin and ibuprofen with no relief.    Past Medical History  Diagnosis Date  . CHF (congestive heart failure)   . Bronchitis   . Diabetes mellitus   . Hypertension   . Ulcer   . Hepatitis C     Past Surgical History  Procedure Date  . Tubal ligation   . Cholecystectomy     Family History  Problem Relation Age of Onset  . Colon cancer Mother     History  Substance Use Topics  . Smoking status: Former Research scientist (life sciences)  . Smokeless tobacco: Not on file  . Alcohol Use: Yes     Comment: ocassionally    OB History    Grav Para Term Preterm Abortions TAB SAB Ect Mult Living                  Review of Systems  Constitutional: Negative for fever.  HENT: Negative for neck pain.   Respiratory: Negative.   Gastrointestinal: Negative.   Genitourinary: Negative.   Musculoskeletal: Positive for arthralgias (right knee and right leg pain).  Skin: Negative.   All other systems reviewed and are negative.    Allergies  Morphine and related and Shellfish allergy  Home Medications   Current Outpatient Rx  Name  Route  Sig  Dispense  Refill  . ALBUTEROL SULFATE HFA 108 (90 BASE) MCG/ACT IN AERS   Inhalation   Inhale 2 puffs into  the lungs every 6 (six) hours as needed. For shortness of breath         . ASPIRIN 325 MG PO TBEC   Oral   Take 325 mg by mouth daily.         [name] CARVEDILOL 12.5 MG PO TABS   Oral   Take 12.5 mg by mouth 2 (two) times daily with a meal.         . DOXAZOSIN MESYLATE 2 MG PO TABS   Oral   Take 1 tablet (2 mg total) by mouth daily.   30 tablet   0   . HYDROCODONE-ACETAMINOPHEN 5-325 MG PO TABS   Oral   Take 2 tablets by mouth every 4 (four) hours as needed for pain.   10 tablet   0   . IBUPROFEN 600 MG PO TABS   Oral   Take 1 tablet (600 mg total) by mouth every 6 (six) hours as needed for pain.   30 tablet   0   . INSULIN ASPART PROT & ASPART (70-30) 100 UNIT/ML [place] SUSP   Subcutaneous   Inject 50 Units into the skin 2 (two) times  daily with a meal.         . LISINOPRIL 40 MG PO TABS   Oral   Take 1 tablet (40 mg total) by mouth daily.   30 tablet   0   . METFORMIN HCL 1000 MG PO TABS   Oral   Take 1,000 mg by mouth 2 (two) times daily with a meal.         . OXYCODONE-ACETAMINOPHEN 5-325 MG PO TABS   Oral   Take 1-2 tablets by mouth every 4 (four) hours as needed (severe pain).   15 tablet   0   . POLYMYXIN B-TRIMETHOPRIM 10000-0.1 UNIT/ML-% OP SOLN   Right Eye   Place 2 drops into the right eye every 6 (six) hours. Last day 9/27           BP 150/69  Pulse 61  Temp 98.3 F (36.8 C) (Oral)  Resp 16  SpO2 98%  Physical Exam  Nursing note and vitals reviewed. Constitutional: She is oriented to person, place, and time. She appears well-developed and well-nourished. No distress.       Pt is morbidly obese.   HENT:  Head: Normocephalic and atraumatic.  Eyes: Conjunctivae normal and EOM are normal.  Neck: Neck supple. No tracheal deviation present.  Cardiovascular: Normal rate.   Pulmonary/Chest: Effort normal. No respiratory distress.  Abdominal: She exhibits no distension.  Musculoskeletal:       Left knee is tender over the supra patellar  bursa with no palpable fluid.  Limited ROM due to pain.  Intact sensation, pulses, and tendon function in left foot.    Neurological: She is alert and oriented to person, place, and time. No sensory deficit.  Skin: Skin is dry.  Psychiatric: She has a normal mood and affect. Her behavior is normal.    ED Course  Procedures   DIAGNOSTIC STUDIES: Oxygen Saturation is 98% on room air, normal by my interpretation.    COORDINATION OF CARE:   12:56 PM Will give pain medication, splint, and crutches.  Discussed with pt the need to f/u with orthopaedic.  Pt understands and agrees.        1. Osteoarthritis of left knee     I personally performed the services described in this documentation, which was scribed in my presence. The recorded information has been reviewed and considered.  Katy Apo, MD      Mylinda Latina III, MD 05/17/12 [phone]

## 2012-05-17 NOTE — ED Notes (Signed)
Ortho tech paged  

## 2012-05-17 NOTE — ED Notes (Signed)
Pt presents to department for evaluation of L knee and leg pain. Was seen here recently for same. Now states pain has increased, having difficulty bearing weight on leg. CMS intact. Able to wiggle digits. Capillary refill less than 2 seconds. Pt states no relief with Vicodin at home. No signs of acute distress at present.
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
## 2012-07-29 IMAGING — CT CT ABD-PELV W/O CM
2 of 3 series · 17 of 43 positions shown, 19 images · non-contrast
Comparison: 02/05/2009.

CLINICAL DATA: Low back pain.  Abdominal pain.

CT OF THE ABDOMEN AND PELVIS WITHOUT CONTRAST (CT UROGRAM)
TECHNIQUE: Multidetector CT imaging was performed through the
abdomen and pelvis to include the urinary tract.

[Series 4: lung windows · axial · 0.78mm/px · z∈[+1209,+1339]mm · 14 of 29 slices shown, 16 images]
[im 2/29  soft-tissue]
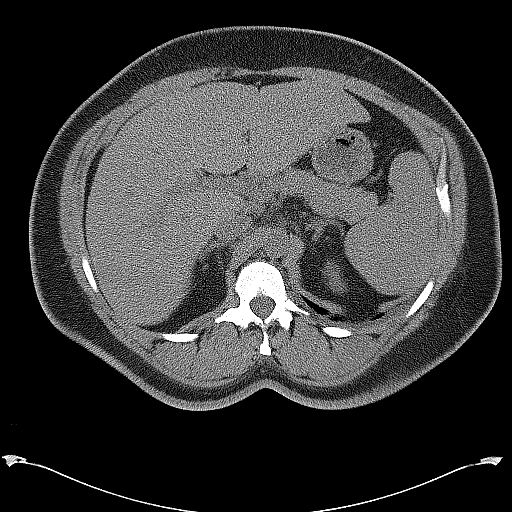
[im 2/29  bone]
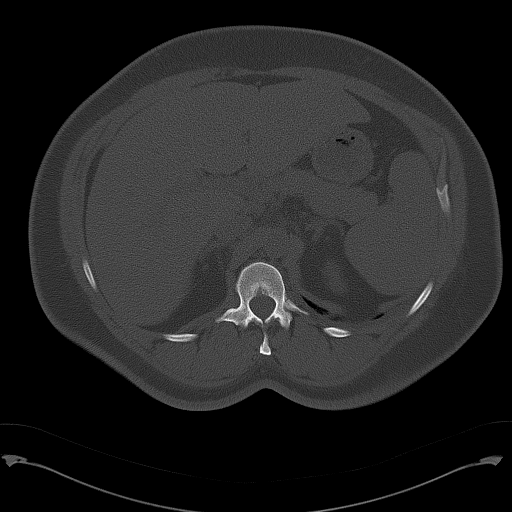
[im 4/29  soft-tissue]
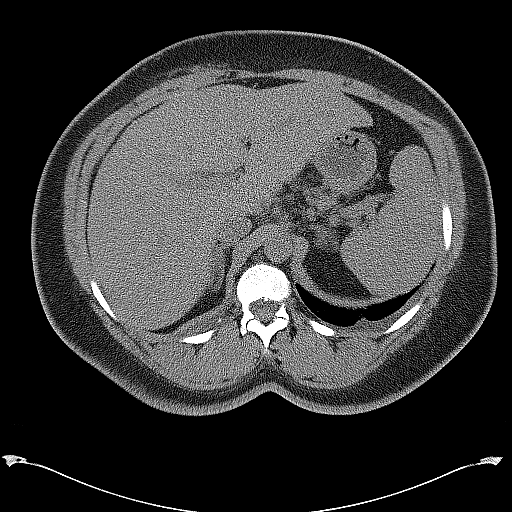
[im 6/29  soft-tissue]
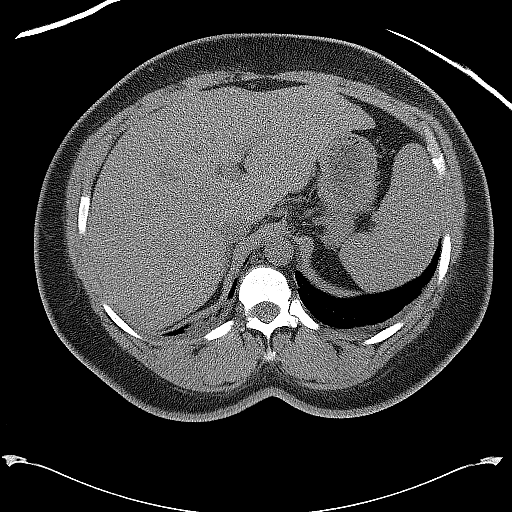
[im 8/29  soft-tissue]
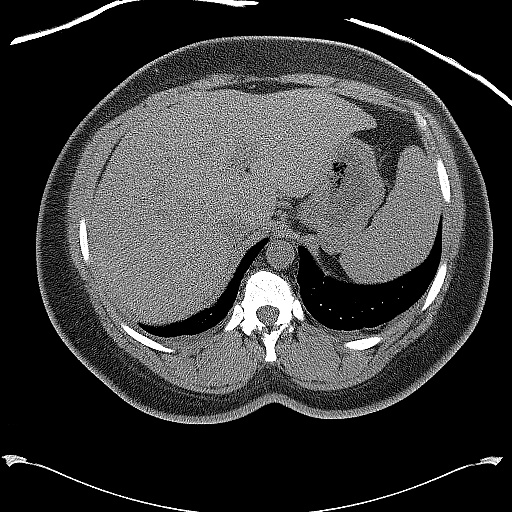
[im 10/29  soft-tissue]
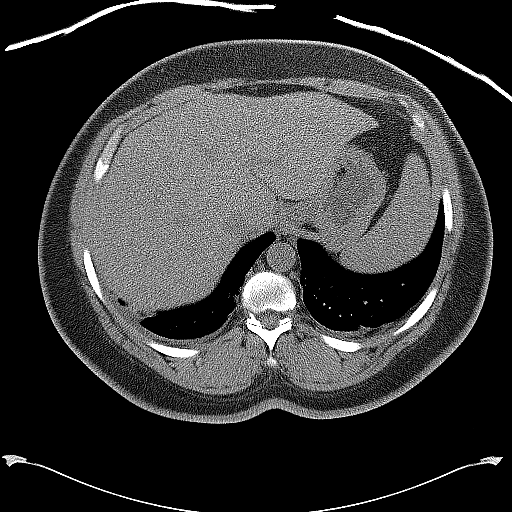
[im 12/29  soft-tissue]
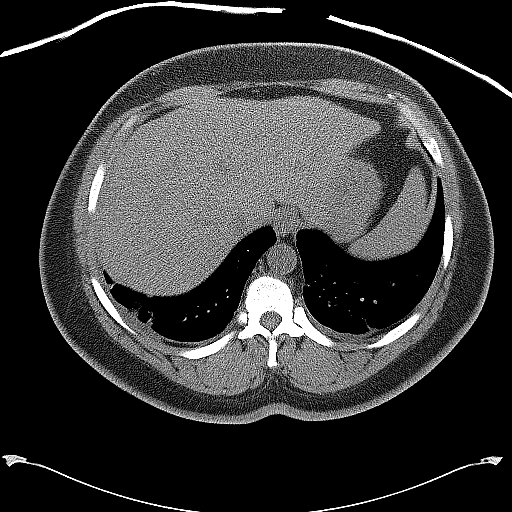
[im 14/29  soft-tissue]
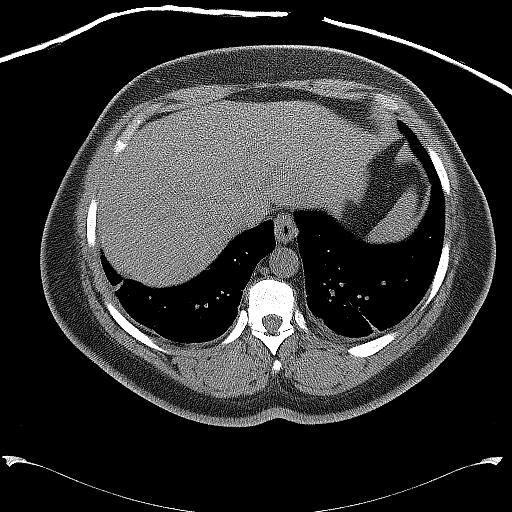
[im 16/29  soft-tissue]
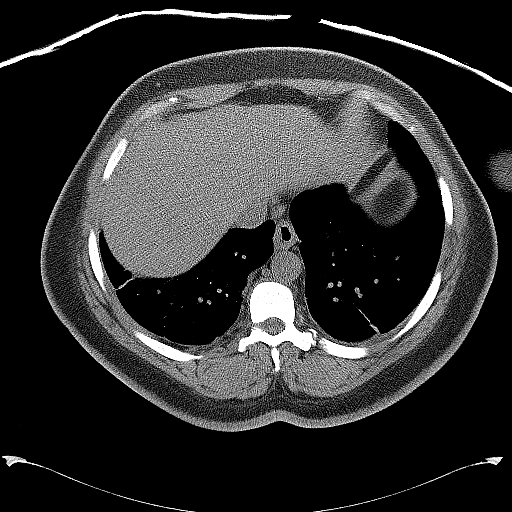
[im 18/29  soft-tissue]
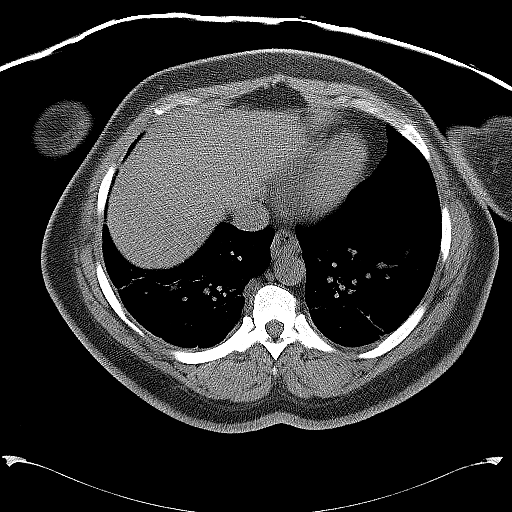
[im 18/29  bone]
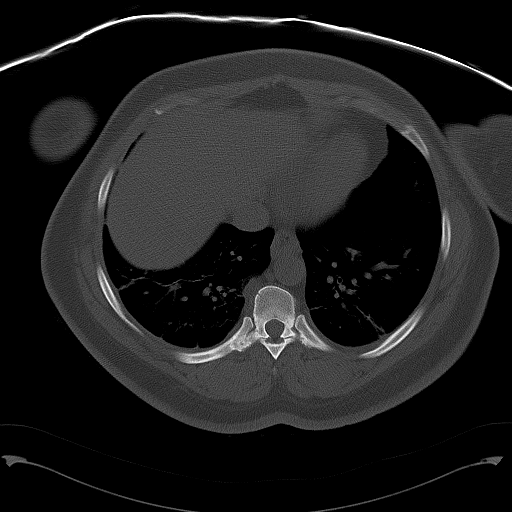
[im 20/29  soft-tissue]
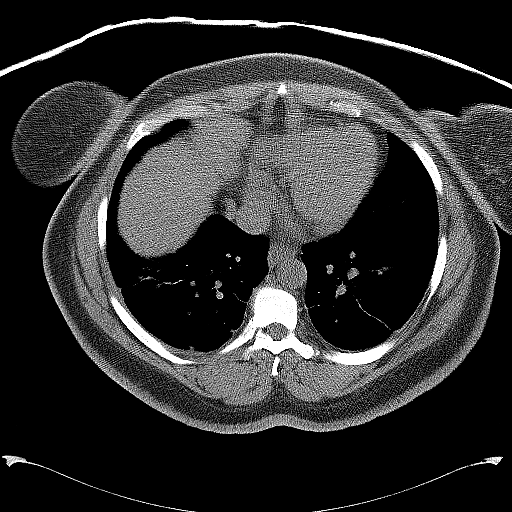
[im 22/29  soft-tissue]
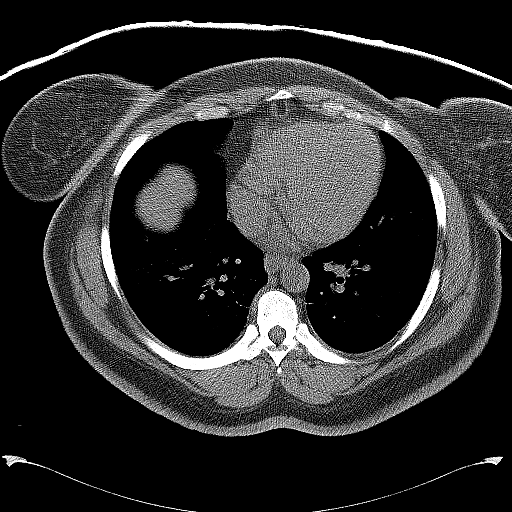
[im 24/29  soft-tissue]
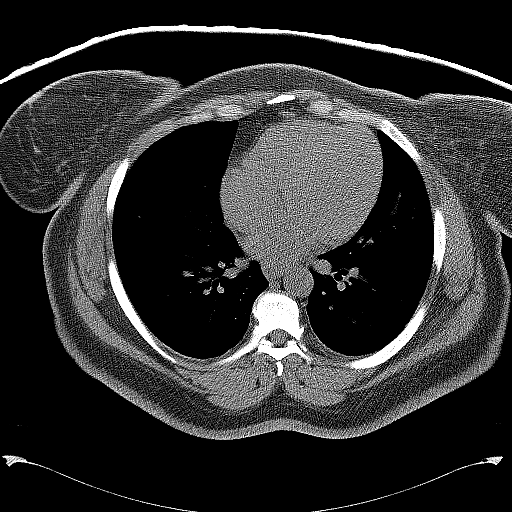
[im 26/29  soft-tissue]
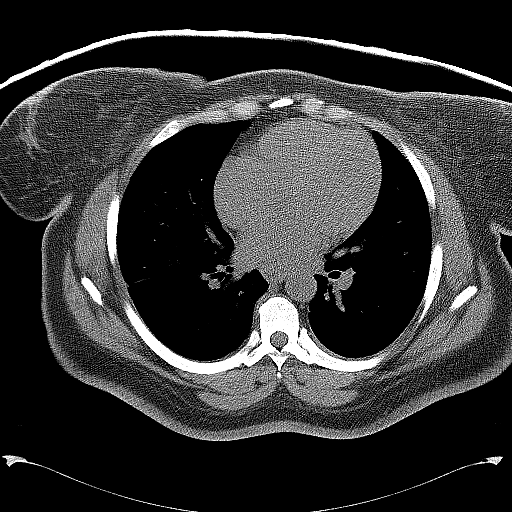
[im 28/29  soft-tissue]
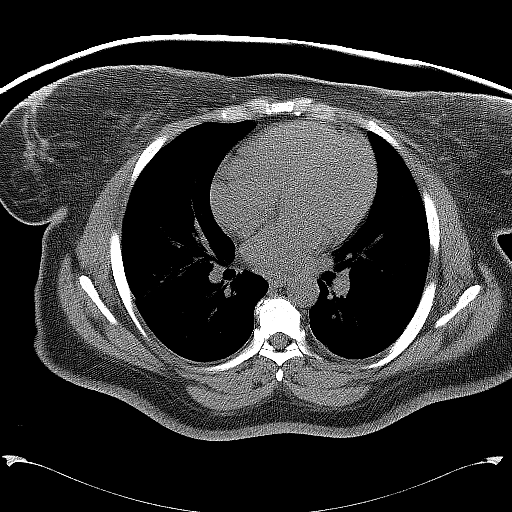

[Series 602: <mpr thick range> · coronal · 0.87mm/px · 3 of 84 slices shown]
[im 28/84  soft-tissue]
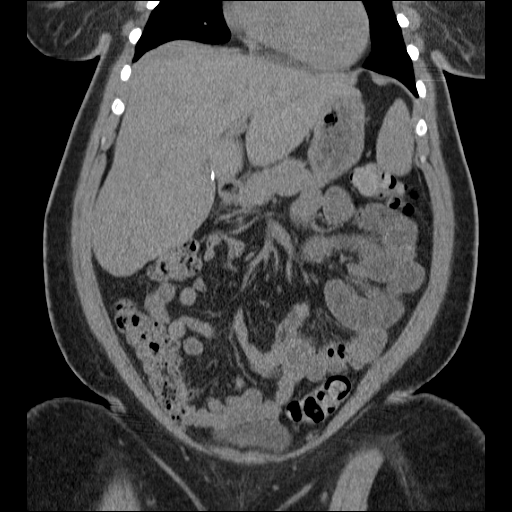
[im 37/84  soft-tissue]
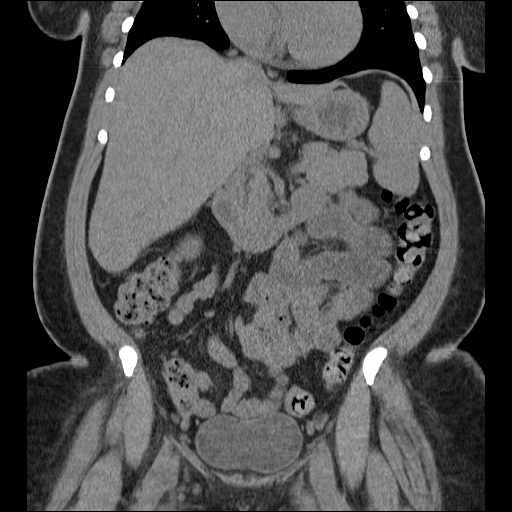
[im 47/84  soft-tissue]
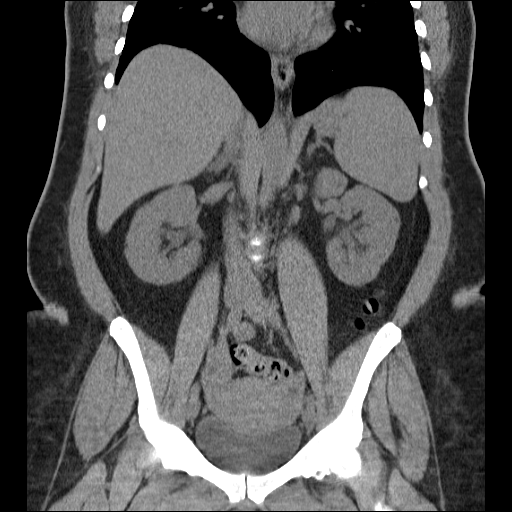

[17 of 43 positions shown; findings below may reference images not displayed]

FINDINGS: Atelectasis and pulmonary parenchymal scarring is
present at both lung bases. Unenhanced CT was performed per
clinician order.  Lack of IV contrast limits sensitivity and
specificity, especially for evaluation of abdominal/pelvic solid
viscera. Cholecystectomy.  Liver appears within normal limits for
noncontrast technique.  Pancreas and spleen unremarkable.  adrenal
glands normal.  Kidneys appear within normal limits without stones
or obstruction.  Low density lesion is present in the left
interpolar y representing renal cyst.

Both ureters appear normal.  Normal appendix.  Urinary bladder
unremarkable.  Normal appearance of the uterus and adnexa.
Bilateral tubal ligation.  Small and large bowel appears within
normal limits.  No inflammatory changes.  No free fluid.  Bilateral
SI joint degenerative disease.  Colonic diverticulosis without
diverticulitis.
IMPRESSION: 1.  No acute abnormality.
2.  Cholecystectomy.
3.  Tubal ligation.
4.  Left renal cyst.

## 2013-03-31 DIAGNOSIS — I509 Heart failure, unspecified: Secondary | ICD-10-CM | POA: Insufficient documentation

## 2013-03-31 DIAGNOSIS — Z833 Family history of diabetes mellitus: Secondary | ICD-10-CM | POA: Insufficient documentation

## 2013-03-31 DIAGNOSIS — M79609 Pain in unspecified limb: Secondary | ICD-10-CM | POA: Insufficient documentation

## 2014-01-25 DIAGNOSIS — M1991 Primary osteoarthritis, unspecified site: Secondary | ICD-10-CM | POA: Insufficient documentation

## 2014-02-01 ENCOUNTER — Encounter (HOSPITAL_COMMUNITY): Payer: Self-pay | Admitting: Emergency Medicine

## 2014-02-01 ENCOUNTER — Emergency Department (HOSPITAL_COMMUNITY)
Admission: EM | Admit: 2014-02-01 | Discharge: 2014-02-01 | Disposition: A | Discharge status: Home / Self Care | Payer: Self-pay | Attending: Emergency Medicine | Admitting: Emergency Medicine

## 2014-02-01 DIAGNOSIS — I10 Essential (primary) hypertension: Secondary | ICD-10-CM | POA: Insufficient documentation

## 2014-02-01 DIAGNOSIS — Z79899 Other long term (current) drug therapy: Secondary | ICD-10-CM | POA: Insufficient documentation

## 2014-02-01 DIAGNOSIS — Z8619 Personal history of other infectious and parasitic diseases: Secondary | ICD-10-CM | POA: Insufficient documentation

## 2014-02-01 DIAGNOSIS — E119 Type 2 diabetes mellitus without complications: Secondary | ICD-10-CM | POA: Insufficient documentation

## 2014-02-01 DIAGNOSIS — Z8709 Personal history of other diseases of the respiratory system: Secondary | ICD-10-CM | POA: Insufficient documentation

## 2014-02-01 DIAGNOSIS — Z872 Personal history of diseases of the skin and subcutaneous tissue: Secondary | ICD-10-CM | POA: Insufficient documentation

## 2014-02-01 DIAGNOSIS — M79609 Pain in unspecified limb: Secondary | ICD-10-CM | POA: Insufficient documentation

## 2014-02-01 DIAGNOSIS — M25569 Pain in unspecified knee: Secondary | ICD-10-CM | POA: Insufficient documentation

## 2014-02-01 DIAGNOSIS — G8929 Other chronic pain: Secondary | ICD-10-CM | POA: Insufficient documentation

## 2014-02-01 DIAGNOSIS — I509 Heart failure, unspecified: Secondary | ICD-10-CM | POA: Insufficient documentation

## 2014-02-01 DIAGNOSIS — Z87891 Personal history of nicotine dependence: Secondary | ICD-10-CM | POA: Insufficient documentation

## 2014-02-01 DIAGNOSIS — Z9889 Other specified postprocedural states: Secondary | ICD-10-CM | POA: Insufficient documentation

## 2014-02-01 DIAGNOSIS — M25561 Pain in right knee: Secondary | ICD-10-CM

## 2014-02-01 MED ORDER — OXYCODONE-ACETAMINOPHEN 5-325 MG PO TABS: 1.0000 | ORAL_TABLET | Freq: Four times a day (QID) | ORAL | Status: DC | PRN

## 2014-02-01 NOTE — ED Provider Notes (Signed)
CSN: [id number]     Arrival date & time 02/01/14  1856 History  This chart was scribed for Junius Creamer, NP, working with Leota Jacobsen, MD by Steva Colder, ED Scribe. The patient was seen in room WTR8/WTR8 at 8:37 PM.      Chief Complaint  Patient presents with  . Leg Pain     The history is provided by the patient. No language interpreter was used.   HPI Comments: [name] is a 58 y.o. female with a h/o DM, and HTN who presents to the Emergency Department complaining of sharp right leg pain onset May 17th. She states that she had surgery on May 17. She states that she has pain in her right leg after a surgery. She states that she had torn ligaments and cysts and that is why she had surgery to her right knee. She states that prior to surgery she was able to walk. She states that now she is unable to walk well because of the pain. She denies any injury to the affected area. She states that she was given a Rx for Vicodin. She states that she had 5 Percocet from the surgery that she has used.   She denies any other associated symptoms. She states that she does not live here and she lives in [place]. She states that she was seen in 2013 for osteoarthritis of the knee.  She states that she does know who she will see now that she has moved to the area. She states that she does not have Medicaid or Insurance. She states that she does not work currently. She states that she has taken other medications that have not helped her. She states that she does not have the Rx with her, she states that it is in [place].    Past Medical History  Diagnosis Date  . CHF (congestive heart failure)   . Bronchitis   . Diabetes mellitus   . Hypertension   . Ulcer   . Hepatitis C    Past Surgical History  Procedure Laterality Date  . Tubal ligation    . Cholecystectomy    . Knee arthroscopy    . Tubal ligation    . Eye surgery     Family History  Problem Relation Age of Onset  . Colon cancer  Mother    History  Substance Use Topics  . Smoking status: Former Research scientist (life sciences)  . Smokeless tobacco: Not on file  . Alcohol Use: Yes     Comment: ocassionally   OB History   Grav Para Term Preterm Abortions TAB SAB Ect Mult Living                 Review of Systems  Constitutional: Negative for fever, chills and appetite change.  Respiratory: Negative for cough and shortness of breath.   Cardiovascular: Negative for chest pain and leg swelling.  Musculoskeletal: Positive for arthralgias and joint swelling.       Right knee pain.  Skin: Negative for wound.  Neurological: Negative for weakness and numbness.  All other systems reviewed and are negative.    Allergies  Morphine and related and Shellfish allergy  Home Medications   Prior to Admission medications   Medication Sig Start Date End Date Taking? Authorizing Provider  amLODipine (NORVASC) 10 MG tablet Take 10 mg by mouth daily.   Yes Historical Provider, MD  carvedilol (COREG) 12.5 MG tablet Take 12.5 mg by mouth 2 (two) times daily with a meal.  Yes Historical Provider, MD  doxazosin (CARDURA) 2 MG tablet Take 1 tablet (2 mg total) by mouth daily. 04/03/12  Yes Samuella Cota, MD  hydrochlorothiazide (HYDRODIURIL) 25 MG tablet Take 25 mg by mouth daily.   Yes Historical Provider, MD  insulin aspart protamine-insulin aspart (NOVOLOG 70/30) (70-30) 100 UNIT/ML injection Inject 32 Units into the skin 2 (two) times daily with a meal.  04/03/12  Yes Samuella Cota, MD  lisinopril (PRINIVIL,ZESTRIL) 40 MG tablet Take 1 tablet (40 mg total) by mouth daily. 04/03/12  Yes Samuella Cota, MD  metFORMIN (GLUCOPHAGE) 1000 MG tablet Take 1,000 mg by mouth 2 (two) times daily with a meal.   Yes Historical Provider, MD  ranitidine (ZANTAC) 150 MG tablet Take 150 mg by mouth 2 (two) times daily.   Yes Historical Provider, MD  oxyCODONE-acetaminophen (PERCOCET/ROXICET) 5-325 MG per tablet Take 1 tablet by mouth every 6 (six) hours as  needed for severe pain. 02/01/14   Garald Balding, NP   BP 159/65  Pulse 77  Temp(Src) 99.1 F (37.3 C) (Oral)  Resp 18  SpO2 96%  Physical Exam  Nursing note and vitals reviewed. Constitutional: She is oriented to person, place, and time. She appears well-developed and well-nourished. No distress.  Morbidly obese  HENT:  Head: Normocephalic and atraumatic.  Eyes: EOM are normal.  Neck: Neck supple. No tracheal deviation present.  Cardiovascular: Normal rate.   Pulmonary/Chest: Effort normal. No respiratory distress.  Musculoskeletal: Normal range of motion. She exhibits tenderness (Right knee pain). She exhibits no edema.   Distal pulses intact. Tender throughout.  Neurological: She is alert and oriented to person, place, and time.  Skin: Skin is warm and dry. No erythema.  Well healed surgical sites. No discoloration or obvious signs of abscess or infection.   Psychiatric: She has a normal mood and affect. Her behavior is normal.    ED Course  Procedures (including critical care time) DIAGNOSTIC STUDIES: Oxygen Saturation is 96% on room air, normal by my interpretation.    COORDINATION OF CARE: 8:44 PM-Discussed treatment plan which includes Percocet with pt at bedside and pt agreed to plan.   Labs Review Labs Reviewed - No data to display  Imaging Review No results found.   EKG Interpretation None      MDM  Will give short term Rx for Percocet and orthopedic referral  Final diagnoses:  Knee pain, chronic, right       I personally performed the services described in this documentation, which was scribed in my presence. The recorded information has been reviewed and is accurate.    Garald Balding, NP 02/01/14 2059

## 2014-02-01 NOTE — ED Notes (Signed)
Pt had surgery on May 17.  Pain continues in rt leg.  Not healing well.  Sharp pains.  Pt does not live here.  Lives in [place].

## 2014-02-01 NOTE — ED Provider Notes (Signed)
Medical screening examination/treatment/procedure(s) were performed by non-physician practitioner and as supervising physician I was immediately available for consultation/collaboration.  Leota Jacobsen, MD 02/01/14 2139

## 2014-02-01 NOTE — Discharge Instructions (Signed)
Arthralgia °Your caregiver has diagnosed you as suffering from an arthralgia. Arthralgia means there is pain in a joint. This can come from many reasons including: °· Bruising the joint which causes soreness (inflammation) in the joint. °· Wear and tear on the joints which occur as we grow older (osteoarthritis). °· Overusing the joint. °· Various forms of arthritis. °· Infections of the joint. °Regardless of the cause of pain in your joint, most of these different pains respond to anti-inflammatory drugs and rest. The exception to this is when a joint is infected, and these cases are treated with antibiotics, if it is a bacterial infection. °HOME CARE INSTRUCTIONS  °· Rest the injured area for as long as directed by your caregiver. Then slowly start using the joint as directed by your caregiver and as the pain allows. Crutches as directed may be useful if the ankles, knees or hips are involved. If the knee was splinted or casted, continue use and care as directed. If an stretchy or elastic wrapping bandage has been applied today, it should be removed and re-applied every 3 to 4 hours. It should not be applied tightly, but firmly enough to keep swelling down. Watch toes and feet for swelling, bluish discoloration, coldness, numbness or excessive pain. If any of these problems (symptoms) occur, remove the ace bandage and re-apply more loosely. If these symptoms persist, contact your caregiver or return to this location. °· For the first 24 hours, keep the injured extremity elevated on pillows while lying down. °· Apply ice for 15-20 minutes to the sore joint every couple hours while awake for the first half day. Then 03-04 times per day for the first 48 hours. Put the ice in a plastic bag and place a towel between the bag of ice and your skin. °· Wear any splinting, casting, elastic bandage applications, or slings as instructed. °· Only take over-the-counter or prescription medicines for pain, discomfort, or fever as  directed by your caregiver. Do not use aspirin immediately after the injury unless instructed by your physician. Aspirin can cause increased bleeding and bruising of the tissues. °· If you were given crutches, continue to use them as instructed and do not resume weight bearing on the sore joint until instructed. °Persistent pain and inability to use the sore joint as directed for more than 2 to 3 days are warning signs indicating that you should see a caregiver for a follow-up visit as soon as possible. Initially, a hairline fracture (break in bone) may not be evident on X-rays. Persistent pain and swelling indicate that further evaluation, non-weight bearing or use of the joint (use of crutches or slings as instructed), or further X-rays are indicated. X-rays may sometimes not show a small fracture until a week or 10 days later. Make a follow-up appointment with your own caregiver or one to whom we have referred you. A radiologist (specialist in reading X-rays) may read your X-rays. Make sure you know how you are to obtain your X-[place] results. Do not assume everything is normal if you do not hear from us. °SEEK MEDICAL CARE IF: °Bruising, swelling, or pain increases. °SEEK IMMEDIATE MEDICAL CARE IF:  °· Your fingers or toes are numb or blue. °· The pain is not responding to medications and continues to stay the same or get worse. °· The pain in your joint becomes severe. °· You develop a fever over 102° F (38.9° C). °· It becomes impossible to move or use the joint. °MAKE SURE YOU:  °·   Understand these instructions.  Will watch your condition.  Will get help right away if you are not doing well or get worse. Document Released: 06/29/2005 Document Revised: 09/21/2011 Document Reviewed: 02/15/2008 [facility] Patient Information 2015 [place], [place]. This information is not intended to replace advice given to you by your health care provider. Make sure you discuss any questions you have with your health care  provider. You've been given a prescription for Percocet.  Please feel this and use as needed.  For severe pain.  I would like you to take Advil or ibuprofen on a regular basis, as well.  This not only gives you some minor pain relief, but helps with inflammation within the joint.  That is not visible from the, surface.  You've also been referred to a local orthopedist.  Please call your orthopedist in [place] and have your medical records.  MRI results, etc. sent to the office, and said when you have your appointment.  These can be evaluated along with you

## 2014-02-01 NOTE — ED Notes (Signed)
Initial Contact - pt to [facility], reports had arthroscopic R knee surgery in May, reports continued and worsening of pain since, pt reports unable to bear weight and pt is noted to have great difficulty with transferring.  Pt denies CP/SOB, denies fevers/chills.  R knee tender to palpation.  Difficult to assess for swelling 2/2 body habitus.  +csm/+pulses.  Skin PWD.  NAD.

## 2014-05-09 ENCOUNTER — Encounter (HOSPITAL_COMMUNITY): Payer: Self-pay | Admitting: Emergency Medicine

## 2014-05-09 ENCOUNTER — Emergency Department (HOSPITAL_COMMUNITY)
Admission: EM | Admit: 2014-05-09 | Discharge: 2014-05-09 | Disposition: A | Discharge status: Home / Self Care | Payer: Self-pay | Attending: Emergency Medicine | Admitting: Emergency Medicine

## 2014-05-09 DIAGNOSIS — I509 Heart failure, unspecified: Secondary | ICD-10-CM | POA: Insufficient documentation

## 2014-05-09 DIAGNOSIS — Z794 Long term (current) use of insulin: Secondary | ICD-10-CM | POA: Insufficient documentation

## 2014-05-09 DIAGNOSIS — Z872 Personal history of diseases of the skin and subcutaneous tissue: Secondary | ICD-10-CM | POA: Insufficient documentation

## 2014-05-09 DIAGNOSIS — Z87891 Personal history of nicotine dependence: Secondary | ICD-10-CM | POA: Insufficient documentation

## 2014-05-09 DIAGNOSIS — Z8719 Personal history of other diseases of the digestive system: Secondary | ICD-10-CM | POA: Insufficient documentation

## 2014-05-09 DIAGNOSIS — E119 Type 2 diabetes mellitus without complications: Secondary | ICD-10-CM | POA: Insufficient documentation

## 2014-05-09 DIAGNOSIS — Z9889 Other specified postprocedural states: Secondary | ICD-10-CM | POA: Insufficient documentation

## 2014-05-09 DIAGNOSIS — I10 Essential (primary) hypertension: Secondary | ICD-10-CM | POA: Insufficient documentation

## 2014-05-09 DIAGNOSIS — Z79899 Other long term (current) drug therapy: Secondary | ICD-10-CM | POA: Insufficient documentation

## 2014-05-09 DIAGNOSIS — J029 Acute pharyngitis, unspecified: Secondary | ICD-10-CM | POA: Insufficient documentation

## 2014-05-09 DIAGNOSIS — M1711 Unilateral primary osteoarthritis, right knee: Secondary | ICD-10-CM

## 2014-05-09 DIAGNOSIS — Z7982 Long term (current) use of aspirin: Secondary | ICD-10-CM | POA: Insufficient documentation

## 2014-05-09 DIAGNOSIS — Z8619 Personal history of other infectious and parasitic diseases: Secondary | ICD-10-CM | POA: Insufficient documentation

## 2014-05-09 DIAGNOSIS — M179 Osteoarthritis of knee, unspecified: Secondary | ICD-10-CM | POA: Insufficient documentation

## 2014-05-09 DIAGNOSIS — R609 Edema, unspecified: Secondary | ICD-10-CM | POA: Insufficient documentation

## 2014-05-09 HISTORY — DX: Acute pancreatitis without necrosis or infection, unspecified: K85.90

## 2014-05-09 MED ORDER — OXYCODONE-ACETAMINOPHEN 5-325 MG PO TABS: 1.0000 | ORAL_TABLET | Freq: Four times a day (QID) | ORAL | Status: DC | PRN

## 2014-05-09 MED ORDER — TRAMADOL HCL 50 MG PO TABS: 50.0000 mg | ORAL_TABLET | Freq: Four times a day (QID) | ORAL | Status: DC | PRN

## 2014-05-09 MED ORDER — TRAMADOL HCL 50 MG PO TABS
50.0000 mg | ORAL_TABLET | Freq: Once | ORAL | Status: AC
  Administered 2014-05-09: 50 mg via ORAL
  Filled 2014-05-09: qty 1

## 2014-05-09 NOTE — ED Provider Notes (Signed)
CSN: [id number]     Arrival date & time 05/09/14  2125 History   First MD Initiated Contact with Patient 05/09/14 2202     Chief Complaint  Patient presents with  . Knee Pain  . Otalgia  . Jaw Pain    HPI The patient presents to the emergency room with complaints of right knee pain. The patient has a history of osteoarthritis. She was told in the past by an orthopedic doctor that she may need a knee replacement. Patient last saw her orthopedist in June and had an injection. She had good relief with that and was told that should last for 3-6 months. Patient's visiting family here in [place] and has noticed increasing pain again. She denies any recent falls or injuries.  Patient also is having some complaints of a soreness on the right side of her throat. She has had some pain with swallowing. She denies any fevers or chills. No cough or congestion. No earache or toothache Past Medical History  Diagnosis Date  . CHF (congestive heart failure)   . Bronchitis   . Diabetes mellitus   . Hypertension   . Ulcer   . Hepatitis C   . Pancreatitis    Past Surgical History  Procedure Laterality Date  . Tubal ligation    . Cholecystectomy    . Knee arthroscopy    . Tubal ligation    . Eye surgery     Family History  Problem Relation Age of Onset  . Colon cancer Mother   . Heart attack Other    History  Substance Use Topics  . Smoking status: Former Research scientist (life sciences)  . Smokeless tobacco: Not on file  . Alcohol Use: Yes     Comment: ocassionally   OB History   Grav Para Term Preterm Abortions TAB SAB Ect Mult Living                 Review of Systems  All other systems reviewed and are negative.     Allergies  Morphine and related and Shellfish allergy  Home Medications   Prior to Admission medications   Medication Sig Start Date End Date Taking? Authorizing Provider  amLODipine (NORVASC) 10 MG tablet Take 10 mg by mouth daily.   Yes Historical Provider, MD  aspirin 325 MG  tablet Take 325 mg by mouth daily.   Yes Historical Provider, MD  carvedilol (COREG) 12.5 MG tablet Take 12.5 mg by mouth 2 (two) times daily with a meal.   Yes Historical Provider, MD  hydrochlorothiazide (HYDRODIURIL) 25 MG tablet Take 25 mg by mouth daily.   Yes Historical Provider, MD  insulin glargine (LANTUS) 100 UNIT/ML injection Inject 32 Units into the skin 2 (two) times daily. Take Morning & Evening.   Yes Historical Provider, MD  lisinopril (PRINIVIL,ZESTRIL) 40 MG tablet Take 1 tablet (40 mg total) by mouth daily. 04/03/12  Yes Samuella Cota, MD  metFORMIN (GLUCOPHAGE) 1000 MG tablet Take 1,000 mg by mouth 2 (two) times daily with a meal.   Yes Historical Provider, MD   BP 150/67  Pulse 82  Temp(Src) 98.6 F (37 C) (Oral)  Resp 18  SpO2 99% Physical Exam  Nursing note and vitals reviewed. Constitutional: No distress.  Morbidly obese   HENT:  Head: Normocephalic and atraumatic.  Right Ear: Tympanic membrane and external ear normal.  Left Ear: Tympanic membrane and external ear normal.  Mouth/Throat: No oropharyngeal exudate.  Eyes: Conjunctivae are normal. Right eye exhibits no discharge.  Left eye exhibits no discharge. No scleral icterus.  Neck: Neck supple. Muscular tenderness present. No tracheal deviation and normal range of motion present. No mass and no thyromegaly present.  Cardiovascular: Normal rate.   Pulmonary/Chest: Effort normal. No stridor. No respiratory distress.  Musculoskeletal: She exhibits no edema.       Right knee: She exhibits swelling. She exhibits normal range of motion, no effusion, no ecchymosis, no deformity and no erythema. Tenderness found.  Abundant adipose tissue, ttp right knee  Lymphadenopathy:    She has no cervical adenopathy.  Neurological: She is alert. Cranial nerve deficit: no gross deficits.  Skin: Skin is warm and dry. No rash noted.  Psychiatric: She has a normal mood and affect.    ED Course  Procedures (including  critical care time) Labs Review Labs Reviewed - No data to display  Imaging Review No results found.   EKG Interpretation None      MDM   Final diagnoses:  Osteoarthritis of right knee, unspecified osteoarthritis type    Pt may have  Mild pharyngitis.  No abnormality appreciated on exam.  Prior xrays have shown DDD.  Symptoms are consistent with that.  Pt plans to see her orthopedist in [place].  Will give dose of pain meds.  Rx pain meds.    Dorie Rank, MD 05/09/14 2237

## 2014-05-09 NOTE — ED Notes (Signed)
Pt is c/o pain in her left knee  Pt states she needs a knee replacement  Pt states the pain has become unbearable  Pt is also c/o left jaw and ear pain  Pt states that pain started last Thursday  Pt states she has had chills

## 2014-05-09 NOTE — Discharge Instructions (Signed)
Knee Pain °The knee is the complex joint between your thigh and your lower leg. It is made up of bones, tendons, ligaments, and cartilage. The bones that make up the knee are: °· The femur in the thigh. °· The tibia and fibula in the lower leg. °· The patella or kneecap riding in the groove on the lower femur. °CAUSES  °Knee pain is a common complaint with many causes. A few of these causes are: °· Injury, such as: °¨ A ruptured ligament or tendon injury. °¨ Torn cartilage. °· Medical conditions, such as: °¨ Gout °¨ Arthritis °¨ Infections °· Overuse, over training, or overdoing a physical activity. °Knee pain can be minor or severe. Knee pain can accompany debilitating injury. Minor knee problems often respond well to self-care measures or get well on their own. More serious injuries may need medical intervention or even surgery. °SYMPTOMS °The knee is complex. Symptoms of knee problems can vary widely. Some of the problems are: °· Pain with movement and weight bearing. °· Swelling and tenderness. °· Buckling of the knee. °· Inability to straighten or extend your knee. °· Your knee locks and you cannot straighten it. °· Warmth and redness with pain and fever. °· Deformity or dislocation of the kneecap. °DIAGNOSIS  °Determining what is wrong may be very straight forward such as when there is an injury. It can also be challenging because of the complexity of the knee. Tests to make a diagnosis may include: °· Your caregiver taking a history and doing a physical exam. °· Routine X-rays can be used to rule out other problems. X-rays will not reveal a cartilage tear. Some injuries of the knee can be diagnosed by: °¨ Arthroscopy a surgical technique by which a small video camera is inserted through tiny incisions on the sides of the knee. This procedure is used to examine and repair internal knee joint problems. Tiny instruments can be used during arthroscopy to repair the torn knee cartilage (meniscus). °¨ Arthrography  is a radiology technique. A contrast liquid is directly injected into the knee joint. Internal structures of the knee joint then become visible on X-Canelo film. °¨ An MRI scan is a non X-Sako radiology procedure in which magnetic fields and a computer produce two- or three-dimensional images of the inside of the knee. Cartilage tears are often visible using an MRI scanner. MRI scans have largely replaced arthrography in diagnosing cartilage tears of the knee. °· Blood work. °· Examination of the fluid that helps to lubricate the knee joint (synovial fluid). This is done by taking a sample out using a needle and a syringe. °TREATMENT °The treatment of knee problems depends on the cause. Some of these treatments are: °· Depending on the injury, proper casting, splinting, surgery, or physical therapy care will be needed. °· Give yourself adequate recovery time. Do not overuse your joints. If you begin to get sore during workout routines, back off. Slow down or do fewer repetitions. °· For repetitive activities such as cycling or running, maintain your strength and nutrition. °· Alternate muscle groups. For example, if you are a weight lifter, work the upper body on one day and the lower body the next. °· Either tight or weak muscles do not give the proper support for your knee. Tight or weak muscles do not absorb the stress placed on the knee joint. Keep the muscles surrounding the knee strong. °· Take care of mechanical problems. °¨ If you have flat feet, orthotics or special shoes may help.   See your caregiver if you need help. °¨ Arch supports, sometimes with wedges on the inner or outer aspect of the heel, can help. These can shift pressure away from the side of the knee most bothered by osteoarthritis. °¨ A brace called an "unloader" brace also may be used to help ease the pressure on the most arthritic side of the knee. °· If your caregiver has prescribed crutches, braces, wraps or ice, use as directed. The acronym  for this is PRICE. This means protection, rest, ice, compression, and elevation. °· Nonsteroidal anti-inflammatory drugs (NSAIDs), can help relieve pain. But if taken immediately after an injury, they may actually increase swelling. Take NSAIDs with food in your stomach. Stop them if you develop stomach problems. Do not take these if you have a history of ulcers, stomach pain, or bleeding from the bowel. Do not take without your caregiver's approval if you have problems with fluid retention, heart failure, or kidney problems. °· For ongoing knee problems, physical therapy may be helpful. °· Glucosamine and chondroitin are over-the-counter dietary supplements. Both may help relieve the pain of osteoarthritis in the knee. These medicines are different from the usual anti-inflammatory drugs. Glucosamine may decrease the rate of cartilage destruction. °· Injections of a corticosteroid drug into your knee joint may help reduce the symptoms of an arthritis flare-up. They may provide pain relief that lasts a few months. You may have to wait a few months between injections. The injections do have a small increased risk of infection, water retention, and elevated blood sugar levels. °· Hyaluronic acid injected into damaged joints may ease pain and provide lubrication. These injections may work by reducing inflammation. A series of shots may give relief for as long as 6 months. °· Topical painkillers. Applying certain ointments to your skin may help relieve the pain and stiffness of osteoarthritis. Ask your pharmacist for suggestions. Many over the-counter products are approved for temporary relief of arthritis pain. °· In some countries, doctors often prescribe topical NSAIDs for relief of chronic conditions such as arthritis and tendinitis. A review of treatment with NSAID creams found that they worked as well as oral medications but without the serious side effects. °PREVENTION °· Maintain a healthy weight. Extra pounds  put more strain on your joints. °· Get strong, stay limber. Weak muscles are a common cause of knee injuries. Stretching is important. Include flexibility exercises in your workouts. °· Be smart about exercise. If you have osteoarthritis, chronic knee pain or recurring injuries, you may need to change the way you exercise. This does not mean you have to stop being active. If your knees ache after jogging or playing basketball, consider switching to swimming, water aerobics, or other low-impact activities, at least for a few days a week. Sometimes limiting high-impact activities will provide relief. °· Make sure your shoes fit well. Choose footwear that is right for your sport. °· Protect your knees. Use the proper gear for knee-sensitive activities. Use kneepads when playing volleyball or laying carpet. Buckle your seat belt every time you drive. Most shattered kneecaps occur in car accidents. °· Rest when you are tired. °SEEK MEDICAL CARE IF:  °You have knee pain that is continual and does not seem to be getting better.  °SEEK IMMEDIATE MEDICAL CARE IF:  °Your knee joint feels hot to the touch and you have a high fever. °MAKE SURE YOU:  °· Understand these instructions. °· Will watch your condition. °· Will get help right away if you are not   doing well or get worse. °Document Released: 04/26/2007 Document Revised: 09/21/2011 Document Reviewed: 04/26/2007 °[employer]® Patient Information ©2015 [employer]. This information is not intended to replace advice given to you by your health care provider. Make sure you discuss any questions you have with your health care provider. ° °

## 2014-05-27 IMAGING — CR DG CHEST 1V PORT
1 series · 1 of 1 positions shown · non-contrast
Comparison: 09/02/2010.

CLINICAL DATA: Shortness of breath.

PORTABLE CHEST - 1 VIEW

[AP]
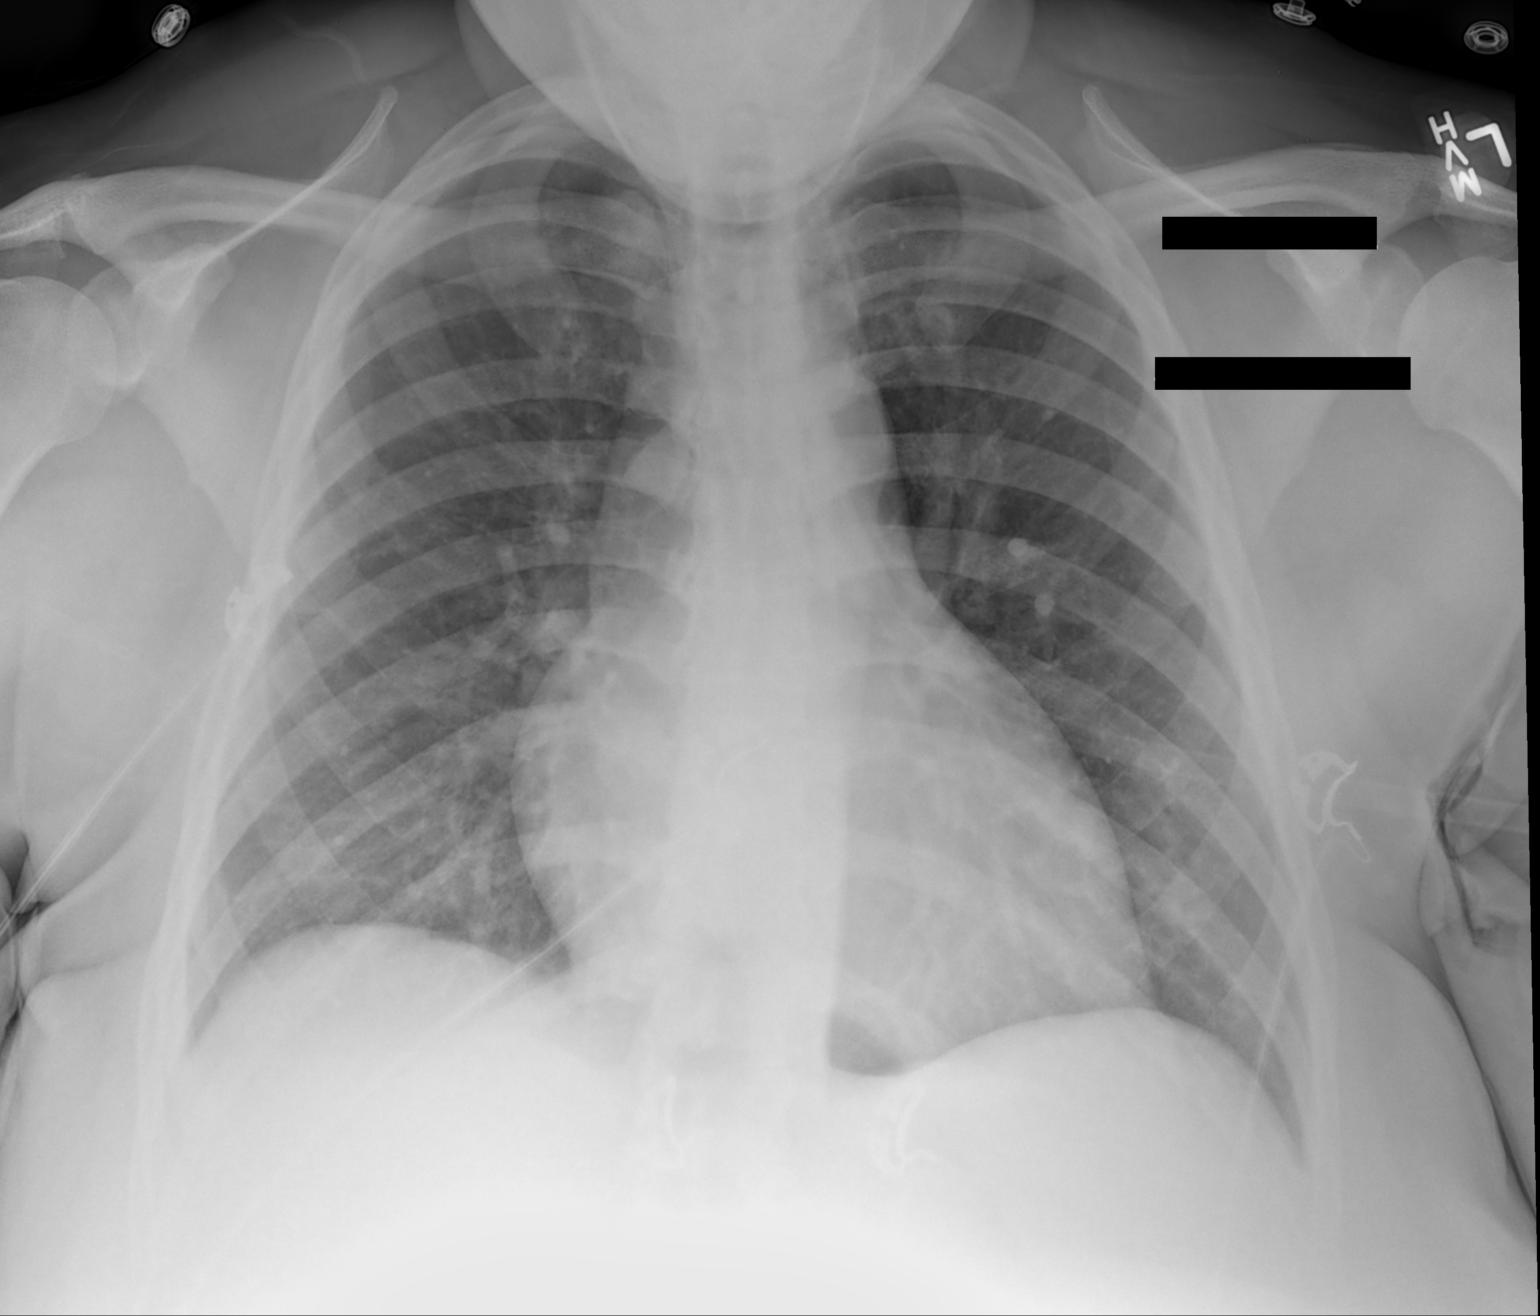

[1 of 1 positions shown; findings below may reference images not displayed]

FINDINGS: The cardiac silhouette, mediastinal and hilar contours
are within normal limits and stable.  The lungs are clear.  No
pleural effusion.  The bony thorax is intact.
IMPRESSION: No acute cardiopulmonary findings.

## 2014-06-18 ENCOUNTER — Emergency Department (HOSPITAL_COMMUNITY): Payer: Self-pay

## 2014-06-18 ENCOUNTER — Emergency Department (HOSPITAL_COMMUNITY)
Admission: EM | Admit: 2014-06-18 | Discharge: 2014-06-18 | Disposition: A | Discharge status: Home / Self Care | Payer: Self-pay | Attending: Emergency Medicine | Admitting: Emergency Medicine

## 2014-06-18 ENCOUNTER — Encounter (HOSPITAL_COMMUNITY): Payer: Self-pay | Admitting: Emergency Medicine

## 2014-06-18 DIAGNOSIS — Z794 Long term (current) use of insulin: Secondary | ICD-10-CM | POA: Insufficient documentation

## 2014-06-18 DIAGNOSIS — I509 Heart failure, unspecified: Secondary | ICD-10-CM | POA: Insufficient documentation

## 2014-06-18 DIAGNOSIS — S01412A Laceration without foreign body of left cheek and temporomandibular area, initial encounter: Secondary | ICD-10-CM | POA: Insufficient documentation

## 2014-06-18 DIAGNOSIS — Z8619 Personal history of other infectious and parasitic diseases: Secondary | ICD-10-CM | POA: Insufficient documentation

## 2014-06-18 DIAGNOSIS — S0100XA Unspecified open wound of scalp, initial encounter: Secondary | ICD-10-CM | POA: Insufficient documentation

## 2014-06-18 DIAGNOSIS — Z87891 Personal history of nicotine dependence: Secondary | ICD-10-CM | POA: Insufficient documentation

## 2014-06-18 DIAGNOSIS — Y9389 Activity, other specified: Secondary | ICD-10-CM | POA: Insufficient documentation

## 2014-06-18 DIAGNOSIS — Z7951 Long term (current) use of inhaled steroids: Secondary | ICD-10-CM | POA: Insufficient documentation

## 2014-06-18 DIAGNOSIS — S0181XA Laceration without foreign body of other part of head, initial encounter: Secondary | ICD-10-CM

## 2014-06-18 DIAGNOSIS — Y998 Other external cause status: Secondary | ICD-10-CM | POA: Insufficient documentation

## 2014-06-18 DIAGNOSIS — E119 Type 2 diabetes mellitus without complications: Secondary | ICD-10-CM | POA: Insufficient documentation

## 2014-06-18 DIAGNOSIS — Z79899 Other long term (current) drug therapy: Secondary | ICD-10-CM | POA: Insufficient documentation

## 2014-06-18 DIAGNOSIS — S0083XA Contusion of other part of head, initial encounter: Secondary | ICD-10-CM

## 2014-06-18 DIAGNOSIS — Z7982 Long term (current) use of aspirin: Secondary | ICD-10-CM | POA: Insufficient documentation

## 2014-06-18 DIAGNOSIS — Y929 Unspecified place or not applicable: Secondary | ICD-10-CM | POA: Insufficient documentation

## 2014-06-18 DIAGNOSIS — I10 Essential (primary) hypertension: Secondary | ICD-10-CM | POA: Insufficient documentation

## 2014-06-18 MED ORDER — CEPHALEXIN 500 MG PO CAPS: 500.0000 mg | ORAL_CAPSULE | Freq: Four times a day (QID) | ORAL | Status: DC

## 2014-06-18 MED ORDER — OXYCODONE-ACETAMINOPHEN 5-325 MG PO TABS
1.0000 | ORAL_TABLET | Freq: Once | ORAL | Status: AC
  Administered 2014-06-18: 1 via ORAL
  Filled 2014-06-18: qty 1

## 2014-06-18 MED ORDER — OXYCODONE-ACETAMINOPHEN 5-325 MG PO TABS
1.0000 | ORAL_TABLET | ORAL | Status: DC | PRN
Start: 2014-06-18 — End: 2014-12-24

## 2014-06-18 MED ORDER — TETANUS-DIPHTH-ACELL PERTUSSIS 5-2.5-18.5 LF-MCG/0.5 IM SUSP
0.5000 mL | Freq: Once | INTRAMUSCULAR | Status: AC
  Administered 2014-06-18: 0.5 mL via INTRAMUSCULAR
  Filled 2014-06-18: qty 0.5

## 2014-06-18 NOTE — ED Notes (Signed)
Pt returned from CT °
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
## 2014-07-12 IMAGING — CR DG CHEST 2V
2 series · 2 of 2 positions shown · non-contrast
Comparison: 02/07/2012.

CLINICAL DATA: Chest pain, shortness of breath and abdominal pain.

CHEST - 2 VIEW

[w chest pa]
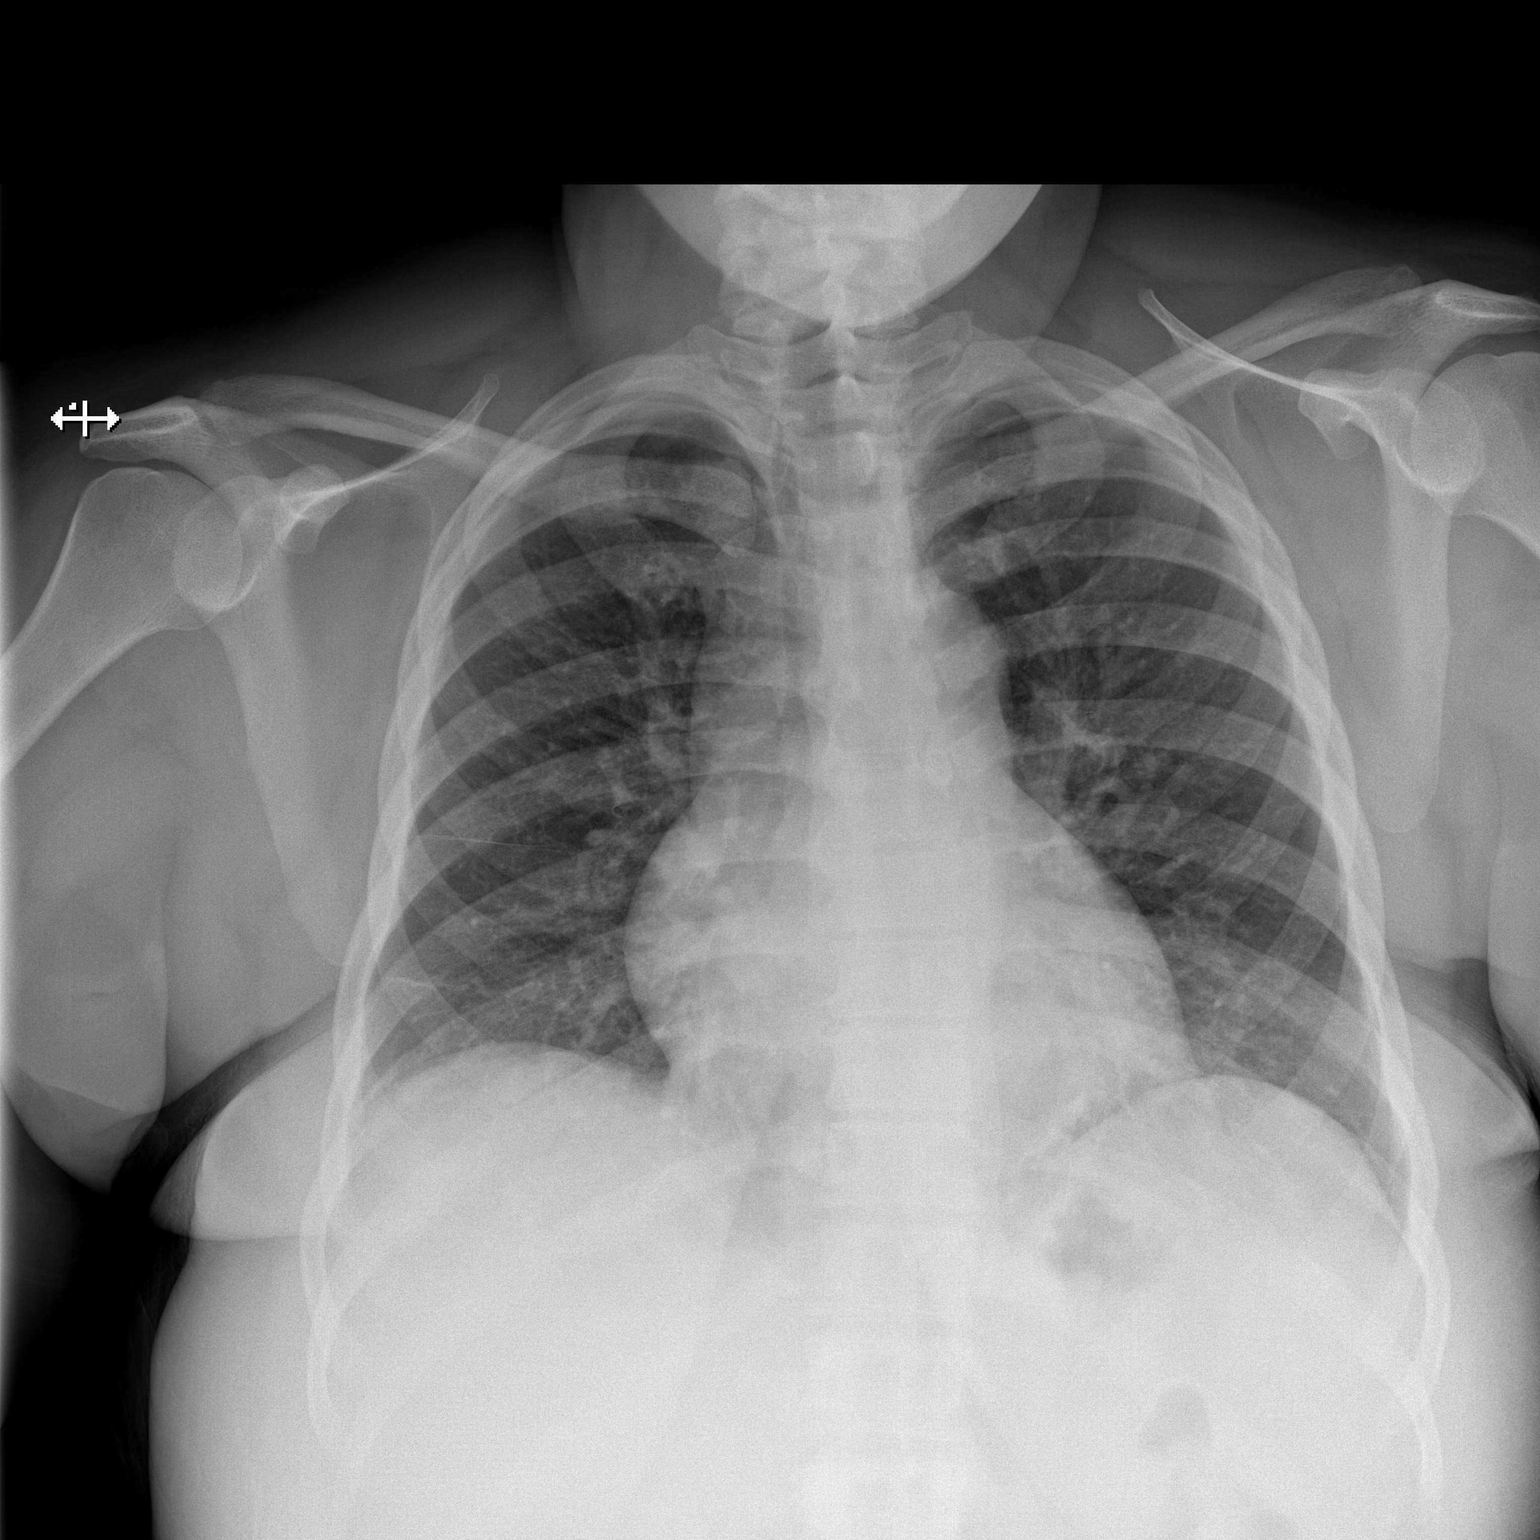

[w chest lat]
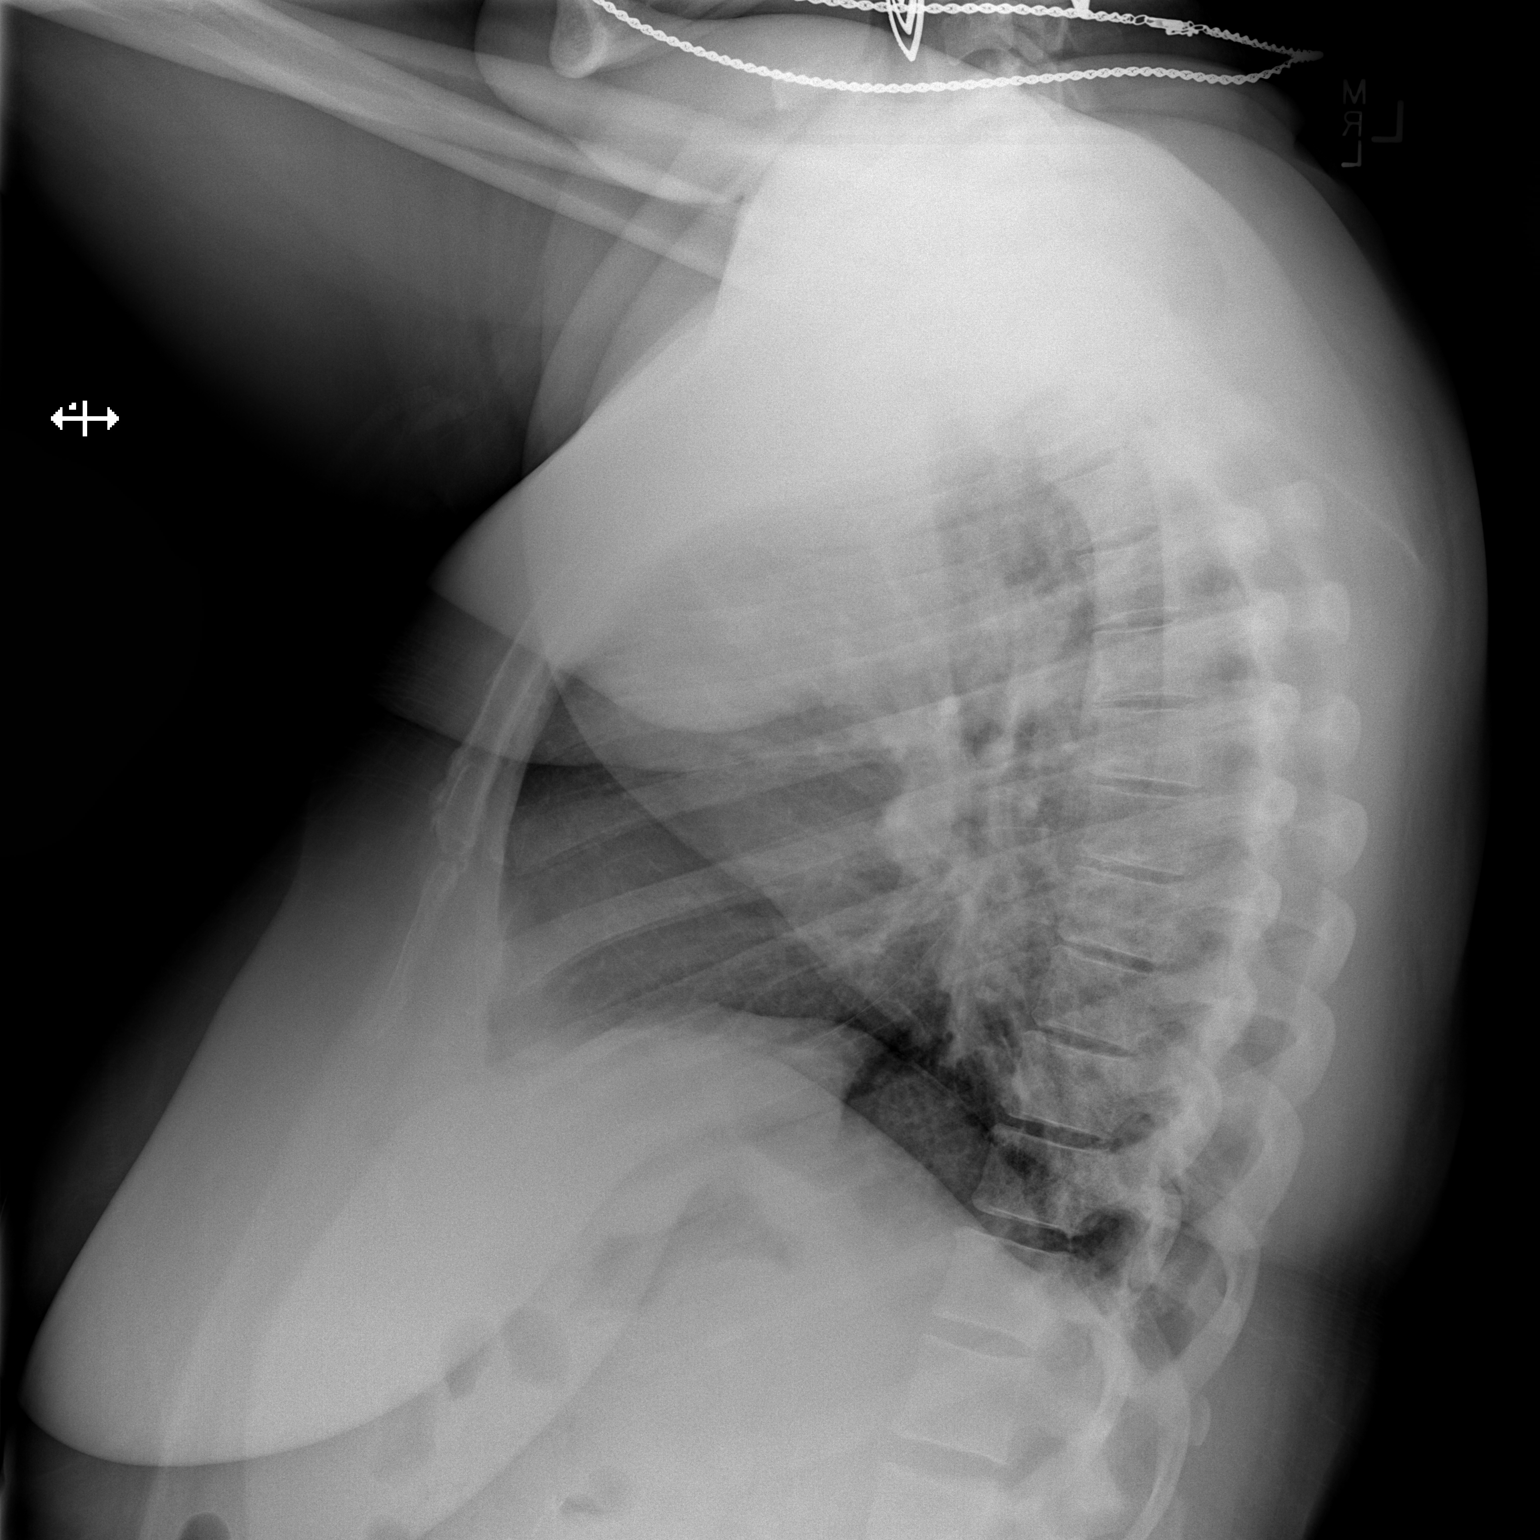

[2 of 2 positions shown; findings below may reference images not displayed]

FINDINGS: Stable borderline enlarged cardiac silhouette and mildly
prominent pulmonary vasculature.  Stable minimal linear scarring at
the right lung base.  The interstitial markings remain minimally
prominent.  Unremarkable bones.  Probable cholecystectomy clips.
IMPRESSION: 1.  No acute abnormality.
2.  Stable borderline cardiomegaly, mild pulmonary vascular
congestion and minimal chronic interstitial lung disease.

## 2014-09-01 IMAGING — CR DG KNEE COMPLETE 4+V*L*
4 series · 4 of 4 positions shown · non-contrast
Comparison: 02/17/2004.

CLINICAL DATA: Left knee pain.

LEFT KNEE - COMPLETE 4+ VIEW

[t knee ap left]
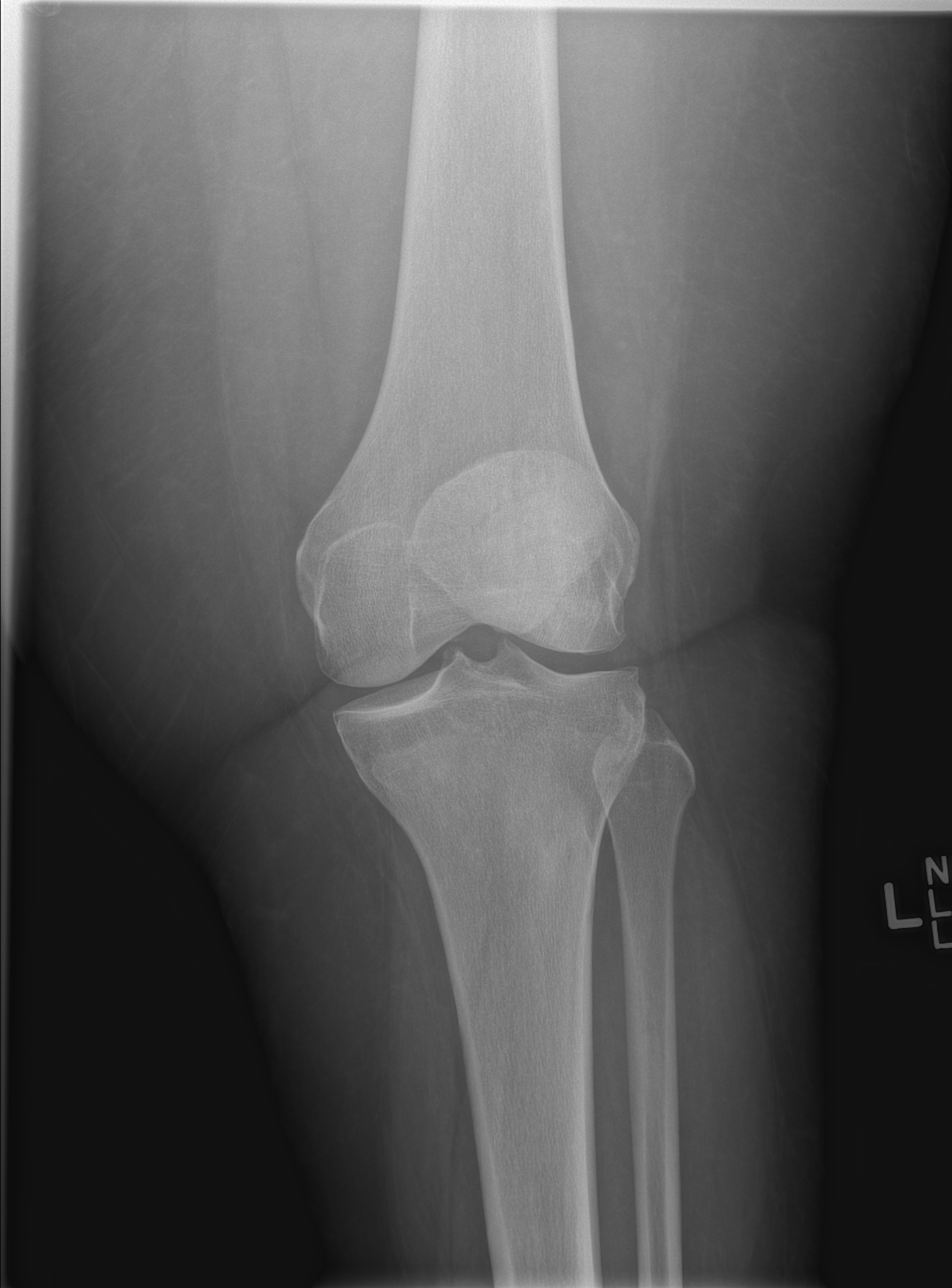

[t knee obl left (1 of 2)]
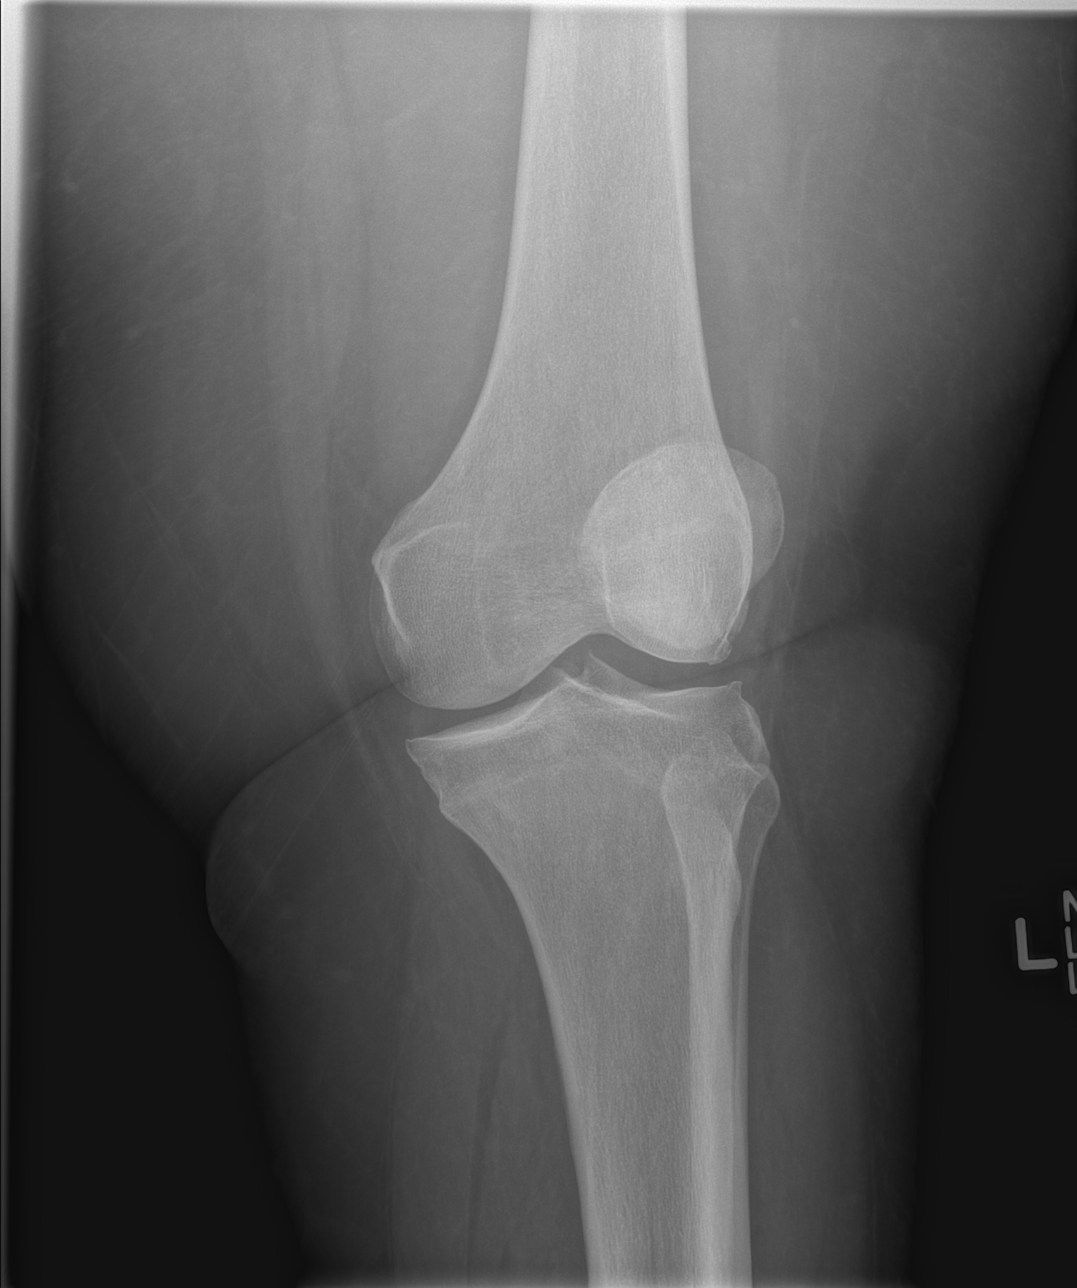

[t knee obl left (2 of 2)]
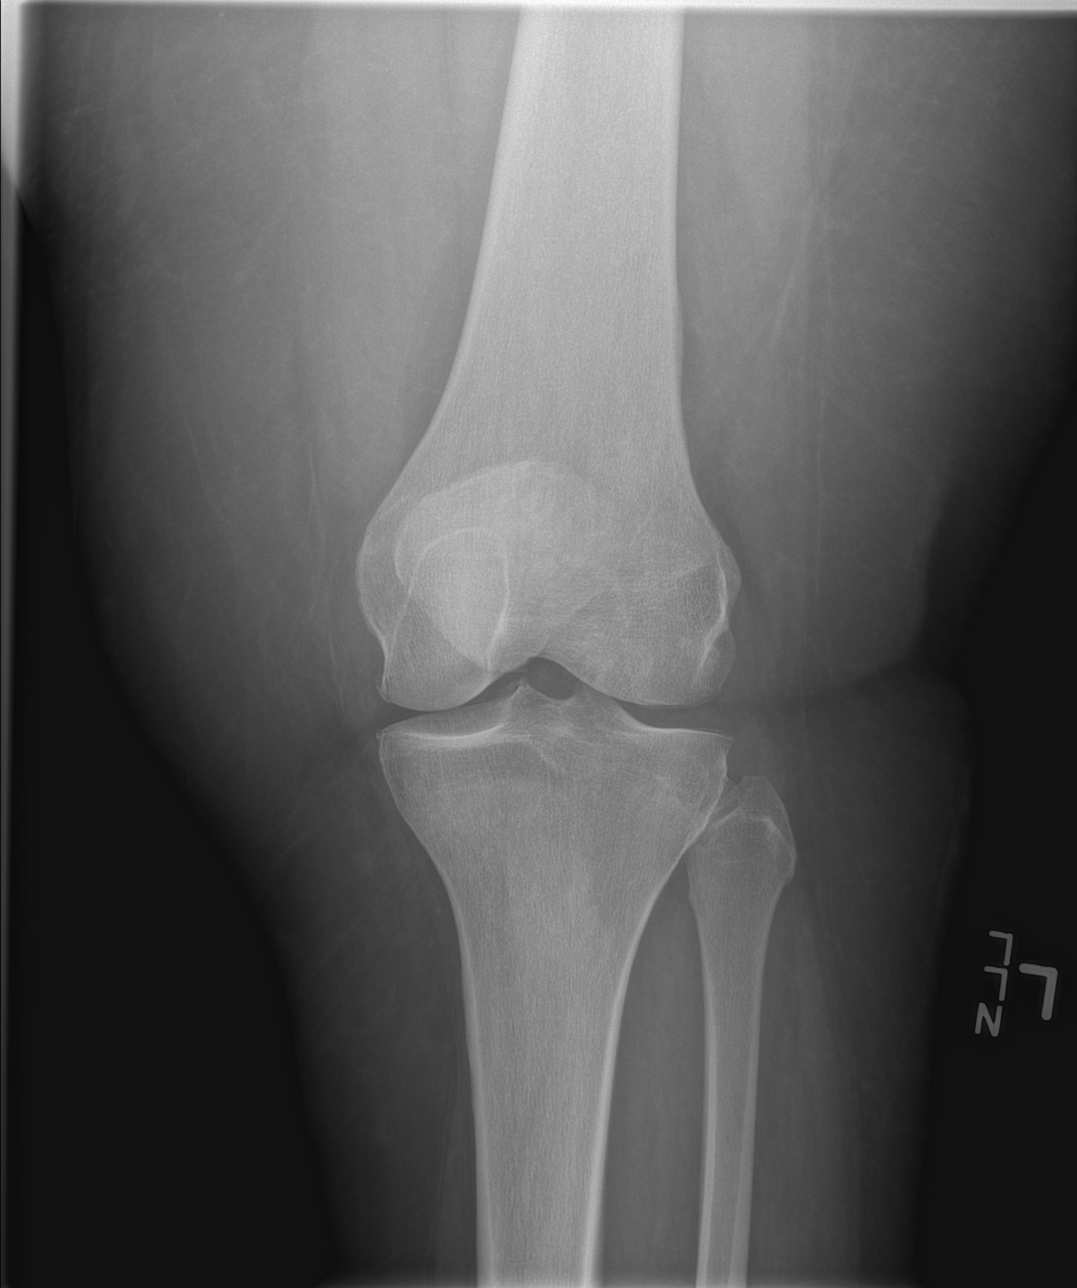

[t knee lat left]
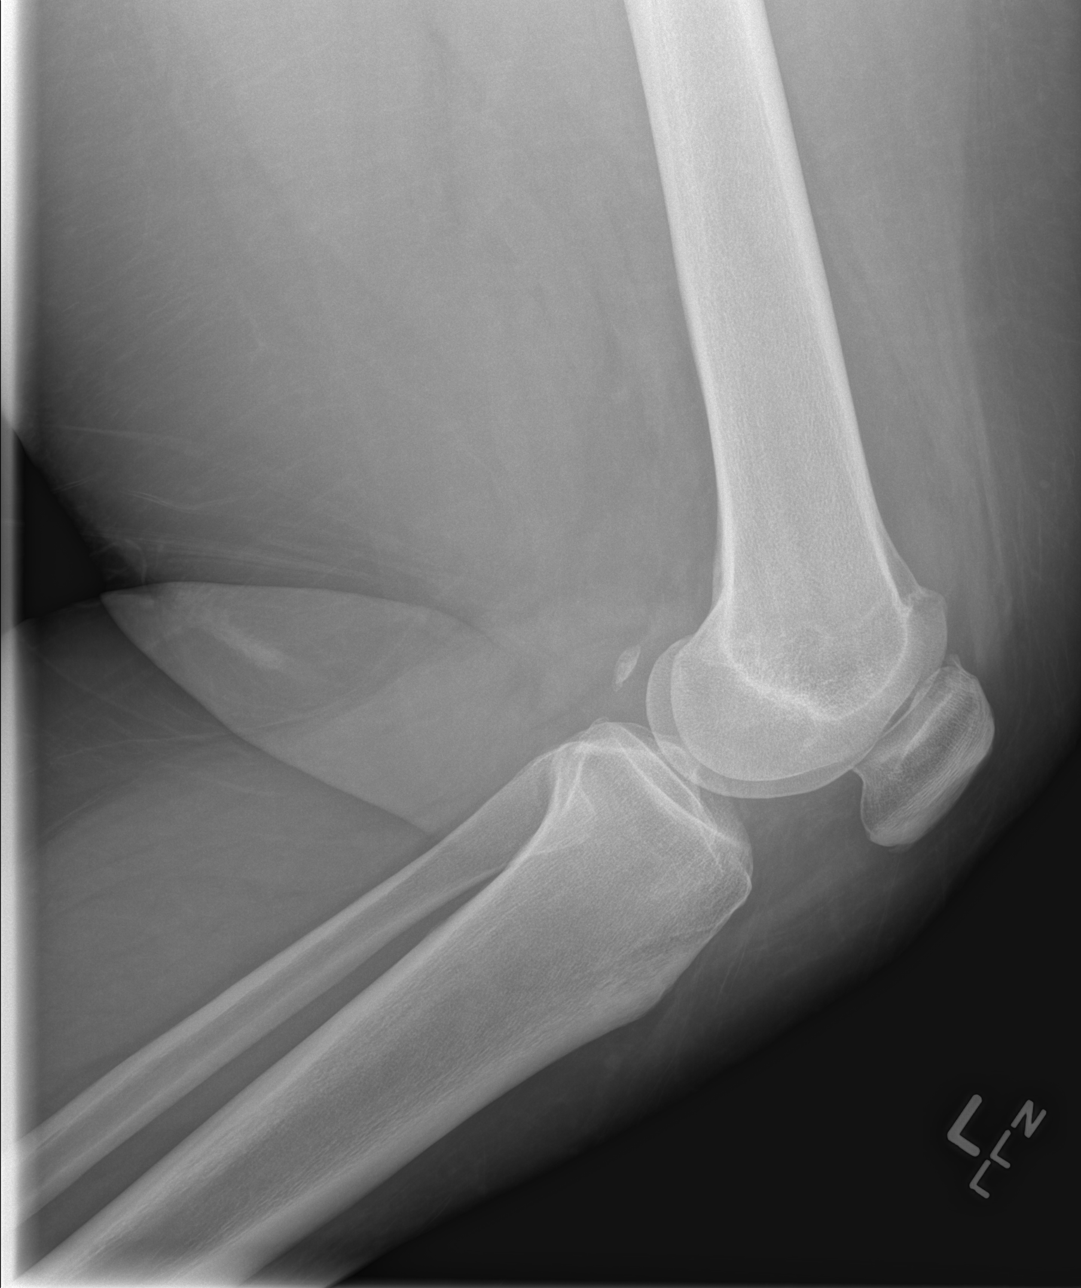

[4 of 4 positions shown; findings below may reference images not displayed]

FINDINGS: There are mild tricompartmental degenerative changes.  No
acute fracture or osteochondral lesion.  No definite joint
effusion.
IMPRESSION: Tricompartmental degenerative changes but no acute bony findings or
joint effusion.

## 2014-10-02 ENCOUNTER — Emergency Department (HOSPITAL_COMMUNITY): Payer: Self-pay

## 2014-10-02 ENCOUNTER — Encounter (HOSPITAL_COMMUNITY): Payer: Self-pay | Admitting: *Deleted

## 2014-10-02 ENCOUNTER — Inpatient Hospital Stay (HOSPITAL_COMMUNITY)
Admission: EM | Admit: 2014-10-02 | Discharge: 2014-10-04 | DRG: 291 | Disposition: A | Discharge status: Home / Self Care | Payer: Self-pay | Attending: Internal Medicine | Admitting: Internal Medicine

## 2014-10-02 DIAGNOSIS — Z87891 Personal history of nicotine dependence: Secondary | ICD-10-CM

## 2014-10-02 DIAGNOSIS — E669 Obesity, unspecified: Secondary | ICD-10-CM | POA: Diagnosis present

## 2014-10-02 DIAGNOSIS — N183 Chronic kidney disease, stage 3 unspecified: Secondary | ICD-10-CM | POA: Diagnosis present

## 2014-10-02 DIAGNOSIS — B1920 Unspecified viral hepatitis C without hepatic coma: Secondary | ICD-10-CM | POA: Diagnosis present

## 2014-10-02 DIAGNOSIS — N179 Acute kidney failure, unspecified: Secondary | ICD-10-CM | POA: Diagnosis present

## 2014-10-02 DIAGNOSIS — Z6841 Body Mass Index (BMI) 40.0 and over, adult: Secondary | ICD-10-CM

## 2014-10-02 DIAGNOSIS — R0789 Other chest pain: Secondary | ICD-10-CM

## 2014-10-02 DIAGNOSIS — D631 Anemia in chronic kidney disease: Secondary | ICD-10-CM | POA: Diagnosis present

## 2014-10-02 DIAGNOSIS — Z885 Allergy status to narcotic agent status: Secondary | ICD-10-CM

## 2014-10-02 DIAGNOSIS — T380X5A Adverse effect of glucocorticoids and synthetic analogues, initial encounter: Secondary | ICD-10-CM | POA: Diagnosis present

## 2014-10-02 DIAGNOSIS — E1122 Type 2 diabetes mellitus with diabetic chronic kidney disease: Secondary | ICD-10-CM

## 2014-10-02 DIAGNOSIS — E785 Hyperlipidemia, unspecified: Secondary | ICD-10-CM | POA: Diagnosis present

## 2014-10-02 DIAGNOSIS — I4581 Long QT syndrome: Secondary | ICD-10-CM | POA: Diagnosis present

## 2014-10-02 DIAGNOSIS — Z7982 Long term (current) use of aspirin: Secondary | ICD-10-CM

## 2014-10-02 DIAGNOSIS — E1165 Type 2 diabetes mellitus with hyperglycemia: Secondary | ICD-10-CM | POA: Diagnosis present

## 2014-10-02 DIAGNOSIS — I10 Essential (primary) hypertension: Secondary | ICD-10-CM | POA: Diagnosis present

## 2014-10-02 DIAGNOSIS — E1100 Type 2 diabetes mellitus with hyperosmolarity without nonketotic hyperglycemic-hyperosmolar coma (NKHHC): Secondary | ICD-10-CM

## 2014-10-02 DIAGNOSIS — R079 Chest pain, unspecified: Secondary | ICD-10-CM | POA: Diagnosis present

## 2014-10-02 DIAGNOSIS — E118 Type 2 diabetes mellitus with unspecified complications: Secondary | ICD-10-CM

## 2014-10-02 DIAGNOSIS — R0602 Shortness of breath: Secondary | ICD-10-CM | POA: Insufficient documentation

## 2014-10-02 DIAGNOSIS — I5033 Acute on chronic diastolic (congestive) heart failure: Principal | ICD-10-CM

## 2014-10-02 DIAGNOSIS — J189 Pneumonia, unspecified organism: Secondary | ICD-10-CM

## 2014-10-02 DIAGNOSIS — E119 Type 2 diabetes mellitus without complications: Secondary | ICD-10-CM

## 2014-10-02 DIAGNOSIS — J9601 Acute respiratory failure with hypoxia: Secondary | ICD-10-CM | POA: Diagnosis present

## 2014-10-02 DIAGNOSIS — Z882 Allergy status to sulfonamides status: Secondary | ICD-10-CM

## 2014-10-02 DIAGNOSIS — Z794 Long term (current) use of insulin: Secondary | ICD-10-CM

## 2014-10-02 DIAGNOSIS — Z9049 Acquired absence of other specified parts of digestive tract: Secondary | ICD-10-CM | POA: Diagnosis present

## 2014-10-02 DIAGNOSIS — D649 Anemia, unspecified: Secondary | ICD-10-CM | POA: Diagnosis present

## 2014-10-02 HISTORY — DX: Reserved for inherently not codable concepts without codable children: IMO0001

## 2014-10-02 HISTORY — DX: Procedure and treatment not carried out because of patient's decision for reasons of belief and group pressure: Z53.1

## 2014-10-02 HISTORY — DX: Adverse effect of unspecified anesthetic, initial encounter: T41.45XA

## 2014-10-02 HISTORY — DX: Unspecified asthma, uncomplicated: J45.909

## 2014-10-02 HISTORY — DX: Other complications of anesthesia, initial encounter: T88.59XA

## 2014-10-02 LAB — CBC WITH DIFFERENTIAL/PLATELET
Basophils Absolute: 0 10*3/uL (ref 0.0–0.1)
Basophils Relative: 0 % (ref 0–1)
Eosinophils Absolute: 0.2 10*3/uL (ref 0.0–0.7)
Eosinophils Relative: 2 % (ref 0–5)
HCT: 33.1 % — ABNORMAL LOW (ref 36.0–46.0)
Hemoglobin: 10.6 g/dL — ABNORMAL LOW (ref 12.0–15.0)
Lymphocytes Relative: 17 % (ref 12–46)
Lymphs Abs: 1.3 10*3/uL (ref 0.7–4.0)
MCH: 26.2 pg (ref 26.0–34.0)
MCHC: 32 g/dL (ref 30.0–36.0)
MCV: 81.9 fL (ref 78.0–100.0)
Monocytes Absolute: 0.4 10*3/uL (ref 0.1–1.0)
Monocytes Relative: 5 % (ref 3–12)
Neutro Abs: 5.8 10*3/uL (ref 1.7–7.7)
Neutrophils Relative %: 76 % (ref 43–77)
Platelets: 234 10*3/uL (ref 150–400)
RBC: 4.04 MIL/uL (ref 3.87–5.11)
RDW: 15.2 % (ref 11.5–15.5)
WBC: 7.5 10*3/uL (ref 4.0–10.5)

## 2014-10-02 LAB — BASIC METABOLIC PANEL
Anion gap: 9 (ref 5–15)
BUN: 22 mg/dL (ref 6–23)
CO2: 24 mmol/L (ref 19–32)
Calcium: 9 mg/dL (ref 8.4–10.5)
Chloride: 106 mmol/L (ref 96–112)
Creatinine, Ser: 1.05 mg/dL (ref 0.50–1.10)
GFR calc Af Amer: 67 mL/min — ABNORMAL LOW (ref >90)
GFR calc non Af Amer: 58 mL/min — ABNORMAL LOW (ref >90)
Glucose, Bld: 272 mg/dL — ABNORMAL HIGH (ref 70–99)
Potassium: 3.9 mmol/L (ref 3.5–5.1)
Sodium: 139 mmol/L (ref 135–145)

## 2014-10-02 LAB — CBG MONITORING, ED
Glucose-Capillary: 261 mg/dL — ABNORMAL HIGH (ref 70–99)
Glucose-Capillary: 290 mg/dL — ABNORMAL HIGH (ref 70–99)
Glucose-Capillary: 321 mg/dL — ABNORMAL HIGH (ref 70–99)

## 2014-10-02 LAB — GLUCOSE, CAPILLARY
Glucose-Capillary: 296 mg/dL — ABNORMAL HIGH (ref 70–99)
Glucose-Capillary: 340 mg/dL — ABNORMAL HIGH (ref 70–99)

## 2014-10-02 LAB — INFLUENZA PANEL BY PCR (TYPE A & B)
H1N1 flu by pcr: NOT DETECTED
Influenza A By PCR: NEGATIVE
Influenza B By PCR: NEGATIVE

## 2014-10-02 LAB — I-STAT TROPONIN, ED: Troponin i, poc: 0 ng/mL (ref 0.00–0.08)

## 2014-10-02 LAB — TROPONIN I
Troponin I: <0.03 ng/mL (ref <0.031)
Troponin I: <0.03 ng/mL (ref <0.031)

## 2014-10-02 LAB — STREP PNEUMONIAE URINARY ANTIGEN: Strep Pneumo Urinary Antigen: NEGATIVE

## 2014-10-02 LAB — BRAIN NATRIURETIC PEPTIDE: B Natriuretic Peptide: 122.5 pg/mL — ABNORMAL HIGH (ref 0.0–100.0)

## 2014-10-02 LAB — D-DIMER, QUANTITATIVE: D-Dimer, Quant: 0.34 ug/mL-FEU (ref 0.00–0.48)

## 2014-10-02 MED ORDER — AMLODIPINE BESYLATE 10 MG PO TABS
10.0000 mg | ORAL_TABLET | Freq: Every day | ORAL | Status: DC
  Administered 2014-10-02 – 2014-10-04 (×3): 10 mg via ORAL
  Filled 2014-10-02: qty 2
  Filled 2014-10-02 (×2): qty 1

## 2014-10-02 MED ORDER — ACETAMINOPHEN 325 MG PO TABS: 650.0000 mg | ORAL_TABLET | ORAL | Status: DC | PRN

## 2014-10-02 MED ORDER — NITROGLYCERIN 2 % TD OINT
1.0000 [in_us] | TOPICAL_OINTMENT | Freq: Four times a day (QID) | TRANSDERMAL | Status: DC
  Administered 2014-10-02: 1 [in_us] via TOPICAL
  Filled 2014-10-02: qty 1

## 2014-10-02 MED ORDER — CARVEDILOL 12.5 MG PO TABS
12.5000 mg | ORAL_TABLET | Freq: Two times a day (BID) | ORAL | Status: DC
  Administered 2014-10-02 – 2014-10-03 (×3): 12.5 mg via ORAL
  Filled 2014-10-02 (×6): qty 1

## 2014-10-02 MED ORDER — INSULIN ASPART 100 UNIT/ML ~~LOC~~ SOLN: 0.0000 [IU] | Freq: Three times a day (TID) | SUBCUTANEOUS | Status: DC

## 2014-10-02 MED ORDER — INFLUENZA VAC SPLIT QUAD 0.5 ML IM SUSY
0.5000 mL | PREFILLED_SYRINGE | INTRAMUSCULAR | Status: AC
  Administered 2014-10-03: 0.5 mL via INTRAMUSCULAR
  Filled 2014-10-02: qty 0.5

## 2014-10-02 MED ORDER — CEFTRIAXONE SODIUM IN DEXTROSE 20 MG/ML IV SOLN
1.0000 g | INTRAVENOUS | Status: DC
  Administered 2014-10-02 – 2014-10-03 (×2): 1 g via INTRAVENOUS
  Filled 2014-10-02 (×3): qty 50

## 2014-10-02 MED ORDER — ALBUTEROL SULFATE HFA 108 (90 BASE) MCG/ACT IN AERS: 2.0000 | INHALATION_SPRAY | RESPIRATORY_TRACT | Status: DC | PRN

## 2014-10-02 MED ORDER — INSULIN ASPART 100 UNIT/ML ~~LOC~~ SOLN: 0.0000 [IU] | Freq: Every day | SUBCUTANEOUS | Status: DC

## 2014-10-02 MED ORDER — HYDROCHLOROTHIAZIDE 25 MG PO TABS: 25.0000 mg | ORAL_TABLET | Freq: Every day | ORAL | Status: DC

## 2014-10-02 MED ORDER — LEVOFLOXACIN IN D5W 750 MG/150ML IV SOLN
750.0000 mg | Freq: Once | INTRAVENOUS | Status: AC
  Administered 2014-10-02: 750 mg via INTRAVENOUS
  Filled 2014-10-02: qty 150

## 2014-10-02 MED ORDER — ENOXAPARIN SODIUM 40 MG/0.4ML ~~LOC~~ SOLN
40.0000 mg | SUBCUTANEOUS | Status: DC
  Administered 2014-10-02 – 2014-10-03 (×2): 40 mg via SUBCUTANEOUS
  Filled 2014-10-02 (×3): qty 0.4

## 2014-10-02 MED ORDER — ALBUTEROL (5 MG/ML) CONTINUOUS INHALATION SOLN
10.0000 mg/h | INHALATION_SOLUTION | RESPIRATORY_TRACT | Status: DC
  Administered 2014-10-02: 10 mg/h via RESPIRATORY_TRACT
  Filled 2014-10-02: qty 20

## 2014-10-02 MED ORDER — DEXTROSE 5 % IV SOLN
500.0000 mg | INTRAVENOUS | Status: DC
  Administered 2014-10-03 – 2014-10-04 (×2): 500 mg via INTRAVENOUS
  Filled 2014-10-02 (×2): qty 500

## 2014-10-02 MED ORDER — FAMOTIDINE 10 MG PO TABS
10.0000 mg | ORAL_TABLET | Freq: Every day | ORAL | Status: DC
  Administered 2014-10-02 – 2014-10-03 (×2): 10 mg via ORAL
  Filled 2014-10-02 (×3): qty 1

## 2014-10-02 MED ORDER — ALBUTEROL SULFATE (2.5 MG/3ML) 0.083% IN NEBU
2.5000 mg | INHALATION_SOLUTION | RESPIRATORY_TRACT | Status: DC | PRN
  Administered 2014-10-03: 2.5 mg via RESPIRATORY_TRACT
  Filled 2014-10-02: qty 3

## 2014-10-02 MED ORDER — ALPRAZOLAM 0.25 MG PO TABS: 0.2500 mg | ORAL_TABLET | Freq: Two times a day (BID) | ORAL | Status: DC | PRN

## 2014-10-02 MED ORDER — MORPHINE SULFATE 2 MG/ML IJ SOLN: 2.0000 mg | INTRAMUSCULAR | Status: DC | PRN

## 2014-10-02 MED ORDER — ATORVASTATIN CALCIUM 20 MG PO TABS
20.0000 mg | ORAL_TABLET | Freq: Every day | ORAL | Status: DC
  Administered 2014-10-02 – 2014-10-04 (×3): 20 mg via ORAL
  Filled 2014-10-02 (×4): qty 1

## 2014-10-02 MED ORDER — OXYCODONE-ACETAMINOPHEN 5-325 MG PO TABS
1.0000 | ORAL_TABLET | ORAL | Status: DC | PRN
  Administered 2014-10-02 (×2): 1 via ORAL
  Administered 2014-10-03: 2 via ORAL
  Filled 2014-10-02: qty 2
  Filled 2014-10-02 (×2): qty 1

## 2014-10-02 MED ORDER — ASPIRIN 325 MG PO TABS
325.0000 mg | ORAL_TABLET | Freq: Every day | ORAL | Status: DC
  Administered 2014-10-02 – 2014-10-04 (×3): 325 mg via ORAL
  Filled 2014-10-02 (×3): qty 1

## 2014-10-02 MED ORDER — ONDANSETRON HCL 4 MG/2ML IJ SOLN: 4.0000 mg | Freq: Four times a day (QID) | INTRAMUSCULAR | Status: DC | PRN

## 2014-10-02 MED ORDER — FUROSEMIDE 10 MG/ML IJ SOLN
40.0000 mg | Freq: Once | INTRAMUSCULAR | Status: AC
  Administered 2014-10-02: 40 mg via INTRAVENOUS
  Filled 2014-10-02: qty 4

## 2014-10-02 MED ORDER — HEPARIN (PORCINE) IN NACL 100-0.45 UNIT/ML-% IJ SOLN
1450.0000 [IU]/h | INTRAMUSCULAR | Status: DC
  Administered 2014-10-02: 1450 [IU]/h via INTRAVENOUS
  Filled 2014-10-02: qty 250

## 2014-10-02 MED ORDER — INSULIN ASPART 100 UNIT/ML ~~LOC~~ SOLN
0.0000 [IU] | SUBCUTANEOUS | Status: DC
  Administered 2014-10-02: 11 [IU] via SUBCUTANEOUS
  Administered 2014-10-02 (×2): 8 [IU] via SUBCUTANEOUS
  Administered 2014-10-02: 11 [IU] via SUBCUTANEOUS
  Administered 2014-10-03: 8 [IU] via SUBCUTANEOUS
  Administered 2014-10-03: 11 [IU] via SUBCUTANEOUS
  Administered 2014-10-03: 8 [IU] via SUBCUTANEOUS
  Administered 2014-10-03 – 2014-10-04 (×3): 11 [IU] via SUBCUTANEOUS
  Administered 2014-10-04: 3 [IU] via SUBCUTANEOUS
  Administered 2014-10-04: 8 [IU] via SUBCUTANEOUS
  Administered 2014-10-04: 5 [IU] via SUBCUTANEOUS
  Filled 2014-10-02 (×2): qty 1

## 2014-10-02 MED ORDER — INSULIN GLARGINE 100 UNIT/ML ~~LOC~~ SOLN
40.0000 [IU] | Freq: Every day | SUBCUTANEOUS | Status: DC
  Administered 2014-10-02 – 2014-10-03 (×2): 40 [IU] via SUBCUTANEOUS
  Filled 2014-10-02 (×3): qty 0.4

## 2014-10-02 MED ORDER — GI COCKTAIL ~~LOC~~
30.0000 mL | Freq: Four times a day (QID) | ORAL | Status: DC | PRN
  Filled 2014-10-02: qty 30

## 2014-10-02 MED ORDER — LISINOPRIL 40 MG PO TABS
40.0000 mg | ORAL_TABLET | Freq: Every day | ORAL | Status: DC
  Administered 2014-10-02 – 2014-10-04 (×3): 40 mg via ORAL
  Filled 2014-10-02: qty 2
  Filled 2014-10-02 (×2): qty 1

## 2014-10-02 MED ORDER — METHYLPREDNISOLONE SODIUM SUCC 125 MG IJ SOLR
125.0000 mg | Freq: Once | INTRAMUSCULAR | Status: AC
  Administered 2014-10-02: 125 mg via INTRAVENOUS
  Filled 2014-10-02: qty 2

## 2014-10-02 MED ORDER — METHYLPREDNISOLONE SODIUM SUCC 125 MG IJ SOLR
60.0000 mg | Freq: Three times a day (TID) | INTRAMUSCULAR | Status: DC
Start: 2014-10-02 — End: 2014-10-03
  Administered 2014-10-02 – 2014-10-03 (×4): 60 mg via INTRAVENOUS
  Filled 2014-10-02 (×6): qty 0.96

## 2014-10-02 MED ORDER — HEPARIN BOLUS VIA INFUSION
4000.0000 [IU] | Freq: Once | INTRAVENOUS | Status: AC
  Administered 2014-10-02: 4000 [IU] via INTRAVENOUS
  Filled 2014-10-02: qty 4000

## 2014-10-02 MED ORDER — ZOLPIDEM TARTRATE 5 MG PO TABS
5.0000 mg | ORAL_TABLET | Freq: Every evening | ORAL | Status: DC | PRN
  Administered 2014-10-02: 5 mg via ORAL
  Filled 2014-10-02: qty 1

## 2014-10-02 NOTE — ED Notes (Signed)
Ordered carb modified lunch tray.

## 2014-10-02 NOTE — ED Notes (Signed)
Patient presents via EMS with c/o left non radiating CP that started Monday afternoon between 330 and 430pm  Stated early in the day she was walking around in [place] and noticed she was tired and SOB.  Came home and laid down to take a nap.  Slept for an hour or so and got up still feeling tired.  With any movement has audible rhonchi more on the right.  Denies being sick

## 2014-10-02 NOTE — ED Notes (Addendum)
Per Hospitalist, pt is to remain NPO until Cards comes to assess pt. May need stress test and/or cardiac cath. CBG at this time 261. Pt denies chest pain at this time but is SOB laying in bed with 2L via nasal cannula.

## 2014-10-02 NOTE — Progress Notes (Signed)
ANTICOAGULATION CONSULT NOTE - Initial Consult  Pharmacy Consult for Heparin Indication: chest pain/ACS  Allergies  Allergen Reactions  . Morphine And Related     itching  . Shellfish Allergy     swelling    Patient Measurements: Height: 5\' 8"  (172.7 cm) Weight: 274 lb (124.286 kg) IBW/kg (Calculated) : 63.9 Heparin Dosing Weight: 95 kg  Vital Signs: Temp: 97.8 F (36.6 C) (03/22 0413) Temp Source: Oral (03/22 0413) BP: 175/78 mmHg (03/22 0730) Pulse Rate: 82 (03/22 0730)  Labs:  Recent Labs  10/02/14 0433  HGB 10.6*  HCT 33.1*  PLT 234  CREATININE 1.05    Estimated Creatinine Clearance: 83.2 mL/min (by C-G formula based on Cr of 1.05).   Medical History: Past Medical History  Diagnosis Date  . CHF (congestive heart failure)   . Bronchitis   . Diabetes mellitus   . Hypertension   . Ulcer   . Hepatitis C   . Pancreatitis     Medications:  Zestril  Albuterol  Norvasc  ASA  Lipitor  Neurontin  HCTZ  Novolog  Lantus  Metformin  Zantac  Valtrex    Assessment: 59 yo female with chest pain for heparin  Goal of Therapy:  Heparin level 0.3-0.7 units/ml Monitor platelets by anticoagulation protocol: Yes   Plan:  Heparin 4000 units IV bolus, then start heparin 1450 units/hr Check heparin level in 6 hours.   Noraa Pickeral, Bronson Curb 10/02/2014,7:58 AM

## 2014-10-02 NOTE — Consult Note (Addendum)
CARDIOLOGY CONSULT NOTE      Patient ID: RANADA GUEBARA MRN: [id number] DOB/AGE: [birth year] 59 y.o.  Admit date: 10/02/2014 Referring PhysicianArshad Aida Puffer, MD Primary PhysicianWILLIAMS,DWIGHT M, MD Primary Cardiologist new Reason for Consultation [facility]  HPI: 59 -year-old woman who previously lived in [place]. She states that around 2011, she had an episode of congestive heart failure at that time. She is unaware whether she had a weak heart or strong heart. She does not recall having a coronary angiogram. She was managed with medications. She is certain that she did not have any kind of angioplasty or stent. She moved to [place] a few years after that. She was seeing a doctor regularly there who is treating her for hypertension. She was on the lisinopril, carvedilol and amlodipine for her blood pressure. She states that she has been taking these medicines regularly.  She moved to [place] more recently. She has family here. She states she sees them every day. She has seen a practitioner at the office of Dr. Cynda Familia. This is where she gets the prescriptions for her antihypertensive medicines. She assures me that she has been taking her medicines as prescribed. She had some lower extremity swelling and this was treated with a one-week course of furosemide. After she finished diuretic, the swelling did return.  Over the past few days, she has had worsening exertional shortness of breath. She now feels some shortness of breath even while at rest. Her oxygen saturations have been within normal range while on oxygen and lying down at rest, but she still feels short of breath. Earlier this morning, while walking on room air, it was documented that her sats dropped to 85%.  Her BNP nor chest x-[place] are markedly abnormal.  Review of systems complete and found to be negative unless listed above   Past Medical History  Diagnosis Date  . CHF (congestive heart failure)   . Bronchitis   .  Diabetes mellitus   . Hypertension   . Ulcer   . Hepatitis C   . Pancreatitis     Family History  Problem Relation Age of Onset  . Colon cancer Mother   . Heart attack Other     History   Social History  . Marital Status: Divorced    Spouse Name: N/A  . Number of Children: N/A  . Years of Education: N/A   Occupational History  . Not on file.   Social History Main Topics  . Smoking status: Former Research scientist (life sciences)  . Smokeless tobacco: Not on file  . Alcohol Use: Yes     Comment: ocassionally  . Drug Use: No     Comment: quit 14 yrs ago  . Sexual Activity: Not on file   Other Topics Concern  . Not on file   Social History Narrative    Past Surgical History  Procedure Laterality Date  . Tubal ligation    . Cholecystectomy    . Knee arthroscopy    . Tubal ligation    . Eye surgery        (Not in a hospital admission)  Physical Exam: Vitals:   Filed Vitals:   10/02/14 0745 10/02/14 0800 10/02/14 0815 10/02/14 0825  BP: 171/75 170/79 166/76   Pulse: 87 79 85   Temp:      TempSrc:      Resp: 17 15 20    Height:      Weight:    278 lb 11.2 oz (126.417 kg)  SpO2: 99%  98% 100%    I&O's:  No intake or output data in the 24 hours ending 10/02/14 0834 Physical exam:  [place]/AT EOMI No JVD, No carotid bruit RRR S1S2  Mild bilateral wheezing Soft. NT, nondistended Trace bilateral edema. Pain to palpation No focal motor or sensory deficits Normal affect  Labs:   Lab Results  Component Value Date   WBC 7.5 10/02/2014   HGB 10.6* 10/02/2014   HCT 33.1* 10/02/2014   MCV 81.9 10/02/2014   PLT 234 10/02/2014    Recent Labs Lab 10/02/14 0433  NA 139  K 3.9  CL 106  CO2 24  BUN 22  CREATININE 1.05  CALCIUM 9.0  GLUCOSE 272*   Lab Results  Component Value Date   CKTOTAL 48 07/20/2009   CKMB 0.8 07/20/2009   TROPONINI <0.30 02/07/2012    Lab Results  Component Value Date   CHOL 152 03/25/2012   CHOL  03/21/2009    151        ATP III CLASSIFICATION:   <200     mg/dL   Desirable  200-239  mg/dL   Borderline High  >=240    mg/dL   High          CHOL  04/06/2008    139        ATP III CLASSIFICATION:  <200     mg/dL   Desirable  200-239  mg/dL   Borderline High  >=240    mg/dL   High   Lab Results  Component Value Date   HDL 40 03/25/2012   HDL 38* 03/21/2009   HDL 33* 04/06/2008   Lab Results  Component Value Date   LDLCALC 92 03/25/2012   LDLCALC  03/21/2009    77        Total Cholesterol/HDL:CHD Risk Coronary Heart Disease Risk Table                     Men   Women  1/2 Average Risk   3.4   3.3  Average Risk       5.0   4.4  2 X Average Risk   9.6   7.1  3 X Average Risk  23.4   11.0        Use the calculated Patient Ratio above and the CHD Risk Table to determine the patient's CHD Risk.        ATP III CLASSIFICATION (LDL):  <100     mg/dL   Optimal  100-129  mg/dL   Near or Above                    Optimal  130-159  mg/dL   Borderline  160-189  mg/dL   High  >190     mg/dL   Very High   LDLCALC  04/06/2008    80        Total Cholesterol/HDL:CHD Risk Coronary Heart Disease Risk Table                     Men   Women  1/2 Average Risk   3.4   3.3   Lab Results  Component Value Date   TRIG 101 03/25/2012   TRIG 178* 03/21/2009   TRIG 131 04/06/2008   Lab Results  Component Value Date   CHOLHDL 3.8 03/25/2012   CHOLHDL 4.0 03/21/2009   CHOLHDL 4.2 04/06/2008   No results found for: LDLDIRECT    Radiology: No significant  pulmonary edema noted on chest x-[place]. Basilar infiltrates noted. EKG: Normal sinus rhythm, nonspecific ST segment changes  ASSESSMENT AND PLAN:  Principal Problem:   Chest pain Active Problems:   Uncontrolled hypertension   DM2 (diabetes mellitus, type 2)   Acute on chronic diastolic congestive heart failure, NYHA class 2   Anemia   Acute respiratory failure with hypoxia  Chest pain: She is not reporting any chest pain at this time. She did not have any chest pain with walking  while in the emergency room. Her complaint has more been shortness of breath. Her symptoms seem out of proportion to the abnormality noted on chest x-[place] or to her BNP. It is hard to attribute all of this to fluid overload. Will check echocardiogram to see what his ejection fraction is. Rule out for MI with enzymes. Okay to give Lasix. Would check d-dimer to make sure that there is no component of pulmonary embolism given her oxygen desaturation and only minimally elevated BNP.  No significant leg swelling either, but she does have significant hypertension which could've caused acute diastolic heart failure.  Hypertension: Restart home medicines. She does state that she is taking her medicines. Her medicine regimen is quite good for someone with low ejection fraction. If she does in fact have low ejection fraction by echo, would have to consider adding hydralazine and nitrates.  Review of the chart also shows that she has had some psychiatric issues in the past. Monitor for symptoms of anxiety as well.  Given that her troponin is negative, I would not use heparin for anticoagulation due to treat myocardial ischemia. If there is a concern of pulmonary embolism, could continue heparin. Her significant hypertension does put her at higher risk for bleeding complication.. This does not sound like unstable angina as a primary cause of all of her symptoms. If her troponins become positive, could reconsider anticoagulation and ischemia workup. If her LVEF is low, would also have to consider angiography at that time.  DM: RF for CAD. Managed by hospitalist.  Signed:   Mina Marble, MD, [facility] 10/02/2014, 8:34 AM

## 2014-10-02 NOTE — ED Notes (Signed)
Ordered carb-modified diet tray.

## 2014-10-02 NOTE — Progress Notes (Signed)
D-dimer negative, suspect that she may have acute bronchitis/pneumonia with asthma exacerbation. Will start empiric IV antibiotics, IV steroids and scheduled nebulized bronchodilators. Still awaiting lower extremity Doppler. However will go ahead and discontinue heparin drip for now

## 2014-10-02 NOTE — ED Notes (Addendum)
Pt headache has not changed with pain medication. Spoke with MD Lissa Merlin, told to take nitro patch off of pt. Nitro patch removed at this time. Also ordered to go ahead and draw troponin for 11 am with d-dimer so pt is only stuck one time. Phleb notified.

## 2014-10-02 NOTE — ED Provider Notes (Signed)
CSN: [id number]     Arrival date & time 10/02/14  0344 History   First MD Initiated Contact with Patient 10/02/14 0430     Chief Complaint  Patient presents with  . Shortness of Breath  . Chest Pain     (Consider location/radiation/quality/duration/timing/severity/associated sxs/prior Treatment) HPI Patient presents with left-sided nonradiating chest pain that started Monday afternoon. Is associated with increased shortness of breath especially with any exertion. She also has a cough but denies any sputum production. She's had no fever or chills. Patient admits to bilateral lower extremity swelling. She's been on Lasix in the past for such things. Also admits to diffuse wheezing. Chest pain is currently resolved. Past Medical History  Diagnosis Date  . CHF (congestive heart failure)   . Bronchitis   . Diabetes mellitus   . Hypertension   . Ulcer   . Hepatitis C   . Pancreatitis   . Complication of anesthesia   . Refusal of blood transfusions as patient is Jehovah's Witness   . Asthma    Past Surgical History  Procedure Laterality Date  . Tubal ligation    . Cholecystectomy    . Knee arthroscopy    . Tubal ligation    . Eye surgery     Family History  Problem Relation Age of Onset  . Colon cancer Mother   . Heart attack Other    History  Substance Use Topics  . Smoking status: Former Research scientist (life sciences)  . Smokeless tobacco: Never Used     Comment: quit smoking in 1982  . Alcohol Use: Yes     Comment: ocassionally   OB History    No data available     Review of Systems  Constitutional: Negative for fever and chills.  Respiratory: Positive for cough, shortness of breath and wheezing.   Cardiovascular: Positive for chest pain and leg swelling. Negative for palpitations.  Gastrointestinal: Negative for nausea, vomiting, abdominal pain and diarrhea.  Musculoskeletal: Negative for back pain, neck pain and neck stiffness.  Skin: Negative for rash and wound.  Neurological:  Negative for dizziness, weakness, light-headedness, numbness and headaches.  All other systems reviewed and are negative.     Allergies  Morphine and related and Shellfish allergy  Home Medications   Prior to Admission medications   Medication Sig Start Date End Date Taking? Authorizing Provider  albuterol (PROAIR HFA) 108 (90 BASE) MCG/ACT inhaler Inhale 2 puffs into the lungs every 4 (four) hours as needed for wheezing.  02/07/13  Yes Historical Provider, MD  amLODipine (NORVASC) 10 MG tablet Take 10 mg by mouth daily.   Yes Historical Provider, MD  aspirin 325 MG tablet Take 325 mg by mouth daily.   Yes Historical Provider, MD  carvedilol (COREG) 12.5 MG tablet Take 12.5 mg by mouth 2 (two) times daily with a meal.   Yes Historical Provider, MD  GABAPENTIN PO Take 1 tablet by mouth at bedtime.   Yes Historical Provider, MD  hydrochlorothiazide (HYDRODIURIL) 25 MG tablet Take 25 mg by mouth daily.   Yes Historical Provider, MD  insulin aspart (NOVOLOG) 100 UNIT/ML injection Inject 10 Units into the skin 3 (three) times daily before meals.   Yes Historical Provider, MD  Insulin Glargine 300 UNIT/ML SOPN Inject 40 Units into the skin at bedtime.   Yes Historical Provider, MD  lisinopril (PRINIVIL,ZESTRIL) 40 MG tablet Take 1 tablet (40 mg total) by mouth daily. 04/03/12  Yes Samuella Cota, MD  Meloxicam (MOBIC PO) Take 1 tablet  by mouth daily.   Yes Historical Provider, MD  metFORMIN (GLUCOPHAGE) 1000 MG tablet Take 1,000 mg by mouth 2 (two) times daily with a meal.   Yes Historical Provider, MD  oxyCODONE-acetaminophen (PERCOCET/ROXICET) 5-325 MG per tablet Take 1-2 tablets by mouth every 4 (four) hours as needed for severe pain. 06/18/14  Yes Shari Upstill, PA-C  ranitidine (ZANTAC) 150 MG tablet Take 150 mg by mouth 2 (two) times daily.   Yes Historical Provider, MD  valACYclovir (VALTREX) 500 MG tablet Take 500 mg by mouth daily as needed (outbreak).   Yes Historical Provider, MD   atorvastatin (LIPITOR) 20 MG tablet Take 20 mg by mouth daily.    Historical Provider, MD  cephALEXin (KEFLEX) 500 MG capsule Take 1 capsule (500 mg total) by mouth 4 (four) times daily. Patient not taking: Reported on 10/02/2014 06/18/14   Charlann Lange, PA-C  insulin glargine (LANTUS) 100 UNIT/ML injection Inject 32 Units into the skin 2 (two) times daily. Take Morning & Evening.    Historical Provider, MD   BP 188/86 mmHg  Pulse 76  Temp(Src) 97.6 F (36.4 C) (Oral)  Resp 18  Ht 5\' 8"  (1.727 m)  Wt 281 lb 12 oz (127.8 kg)  BMI 42.85 kg/m2  SpO2 98% Physical Exam  Constitutional: She is oriented to person, place, and time. She appears well-developed and well-nourished. No distress.  HENT:  Head: Normocephalic and atraumatic.  Mouth/Throat: Oropharynx is clear and moist.  Eyes: EOM are normal. Pupils are equal, round, and reactive to light.  Neck: Normal range of motion. Neck supple.  Cardiovascular: Normal rate and regular rhythm.  Exam reveals no gallop and no friction rub.   No murmur heard. Pulmonary/Chest: Effort normal. No respiratory distress. She has no wheezes. She has no rales.  Diffuse expiratory wheezes. Rhonchi bilateral bases.  Abdominal: Soft. Bowel sounds are normal. She exhibits no distension and no mass. There is no tenderness. There is no rebound and no guarding.  Musculoskeletal: Normal range of motion. She exhibits edema (2+ bilateral pitting edema lower extremities). She exhibits no tenderness.  Neurological: She is alert and oriented to person, place, and time.  Moves all extremities without deficit. Sensation is grossly intact.  Skin: Skin is warm and dry. No rash noted. No erythema.  Psychiatric: She has a normal mood and affect. Her behavior is normal.  Nursing note and vitals reviewed.   ED Course  Procedures (including critical care time) Labs Review Labs Reviewed  CBC WITH DIFFERENTIAL/PLATELET - Abnormal; Notable for the following:    Hemoglobin  10.6 (*)    HCT 33.1 (*)    All other components within normal limits  BASIC METABOLIC PANEL - Abnormal; Notable for the following:    Glucose, Bld 272 (*)    GFR calc non Af Amer 58 (*)    GFR calc Af Amer 67 (*)    All other components within normal limits  BRAIN NATRIURETIC PEPTIDE - Abnormal; Notable for the following:    B Natriuretic Peptide 122.5 (*)    All other components within normal limits  CBC - Abnormal; Notable for the following:    Hemoglobin 10.8 (*)    HCT 32.9 (*)    All other components within normal limits  GLUCOSE, CAPILLARY - Abnormal; Notable for the following:    Glucose-Capillary 296 (*)    All other components within normal limits  GLUCOSE, CAPILLARY - Abnormal; Notable for the following:    Glucose-Capillary 340 (*)    All  other components within normal limits  GLUCOSE, CAPILLARY - Abnormal; Notable for the following:    Glucose-Capillary 305 (*)    All other components within normal limits  GLUCOSE, CAPILLARY - Abnormal; Notable for the following:    Glucose-Capillary 276 (*)    All other components within normal limits  CBG MONITORING, ED - Abnormal; Notable for the following:    Glucose-Capillary 261 (*)    All other components within normal limits  CBG MONITORING, ED - Abnormal; Notable for the following:    Glucose-Capillary 290 (*)    All other components within normal limits  CBG MONITORING, ED - Abnormal; Notable for the following:    Glucose-Capillary 321 (*)    All other components within normal limits  CULTURE, BLOOD (ROUTINE X 2)  CULTURE, BLOOD (ROUTINE X 2)  TROPONIN I  TROPONIN I  TROPONIN I  D-DIMER, QUANTITATIVE  INFLUENZA PANEL BY PCR (TYPE A & B, H1N1)  STREP PNEUMONIAE URINARY ANTIGEN  HEMOGLOBIN A1C  LEGIONELLA ANTIGEN, URINE  HIV ANTIBODY (ROUTINE TESTING)  I-STAT TROPOININ, ED    Imaging Review Dg Chest 2 View  10/02/2014   CLINICAL DATA:  Left chest pain, wheezing, dyspnea  EXAM: CHEST  2 VIEW  COMPARISON:   03/24/2012  FINDINGS: Mild basilar airspace opacities are present, infectious versus atelectatic no large effusions. Heart size is normal and unchanged. Pulmonary vasculature is normal.  IMPRESSION: Mild basilar infiltrate or atelectasis   Electronically Signed   By: Andreas Newport M.D.   On: 10/02/2014 05:24     EKG Interpretation   Date/Time:  Tuesday October 02 2014 04:00:01 EDT Ventricular Rate:  81 PR Interval:  143 QRS Duration: 93 QT Interval:  428 QTC Calculation: 497 R Axis:   49 Text Interpretation:  Sinus rhythm Borderline prolonged QT interval  Confirmed by Lita Mains  MD, Millee Denise ([id number]) on 10/02/2014 6:12:10 AM      MDM   Final diagnoses:  Atypical chest pain  Shortness of breath    Patient states she is breathing easier after the breathing treatments. Continued to have mild diffuse expiratory wheezing and rhonchi about the bases. Questionable infection on chest x-[place]. Patient will likely need admission for persistent shortness of breath  Discussed with [facility] hospitalists and will admit.  Julianne Rice, MD 10/03/14 [phone]

## 2014-10-02 NOTE — H&P (Signed)
[facility] History and Physical                                                                                    [name], is a 59 y.o. female  MRN: [id number]   DOB - [birth year]  Admit Date - 10/02/2014  Outpatient Primary MD for the patient is Harvie Junior, MD  With History of -  Past Medical History  Diagnosis Date  . CHF (congestive heart failure)   . Bronchitis   . Diabetes mellitus   . Hypertension   . Ulcer   . Hepatitis C   . Pancreatitis       Past Surgical History  Procedure Laterality Date  . Tubal ligation    . Cholecystectomy    . Knee arthroscopy    . Tubal ligation    . Eye surgery      in for   Chief Complaint  Patient presents with  . Shortness of Breath  . Chest Pain     HPI 58 year old female patient with known history diabetes, hypertension, grade 2 diastolic dysfunction, and issues with progressive lower extremity edema ongoing for about 1 year. The patient reports that yesterday she traveled to [place] and while she was ambulating there develop shortness of breath and became very very tired. She also noted that she was experiencing exertional chest pain as well. She reports that this level of activity was higher than her baseline. By the time she returned home from her trip she was completely exhausted and stated she was not hungry and was unable to eat supper Patient slept for short period of time and then awakened. When she began to walk around her home she once again began having significant chest discomfort and shortness of breath and extreme fatigue and she was barely able to ambulate. She reports the symptoms as waxing and waning since their onset with the chest pain never really resolving. The initial symptoms regarding the chest pain were level 3/10 and at one point at home the pain became significant and it was 10/10. She also had an instance while exerting where she had chest pain radiating to her left arm. She reports  experiencing cold sweats and nausea associated with the chest discomfort. Because of the significant symptoms with weakness patient called EMS to her home. She had similar but less severe symptoms several years ago. She reports the pain as being located in her mid chest radiating into the left breast region and has reported occasionally and in the left arm. She is currently chest pain-free. When questioning she recalls that this past Saturday she had mild vague chest pain and dyspnea on exertion. She initially said about recurrent chest pain may be related to her history of pancreatitis but states current symptoms are not typical of when she had the pancreatitis.  In the ER patient was afebrile at presentation but had marked hypertension with a BP of 192/79, her heart rate was 77. Because of chest pain she had previously been placed on 2 L nasal cannula oxygen by the paramedics. Her EKG was unremarkable except for borderline prolonged QTC at 491 ms. No definitive ischemic changes. BNP  was mildly elevated at 122. Initial troponin was negative at 0.00. Labs were essentially unremarkable except for glucose 272 and hemoglobin of 10.6 which is at the patient's baseline. Chest x-[place] demonstrated mild basilar airspace opacities.  Review of Systems   In addition to the HPI above,  No Fever-chills, myalgias or other constitutional symptoms No Headache, changes with Vision or hearing, new weakness, tingling, numbness in any extremity, No problems swallowing food or Liquids, indigestion/reflux No Abdominal pain, emesis; no melena or hematochezia, no dark tarry stools, Bowel movements are regular, No dysuria, hematuria or flank pain No new skin rashes, lesions, masses or bruises, No new joints pains-aches No recent weight gain or loss No polyuria, polydypsia or polyphagia,  *A full 10 point Review of Systems was done, except as stated above, all other Review of Systems were negative.  Social History History    Substance Use Topics  . Smoking status: Former Consulting civil engineer   . Smokeless tobacco: Not on file  . Alcohol Use: Yes     Comment: ocassionally    Family History Family History  Problem Relation Age of Onset  . Colon cancer Mother   . Heart attack Father/deceased age 37      Prior to Admission medications   Medication Sig Start Date End Date Taking? Authorizing Provider  albuterol (PROAIR HFA) 108 (90 BASE) MCG/ACT inhaler Inhale 2 puffs into the lungs every 4 (four) hours as needed for wheezing.  02/07/13  Yes Historical Provider, MD  amLODipine (NORVASC) 10 MG tablet Take 10 mg by mouth daily.   Yes Historical Provider, MD  aspirin 325 MG tablet Take 325 mg by mouth daily.   Yes Historical Provider, MD  carvedilol (COREG) 12.5 MG tablet Take 12.5 mg by mouth 2 (two) times daily with a meal.   Yes Historical Provider, MD  GABAPENTIN PO Take 1 tablet by mouth at bedtime.   Yes Historical Provider, MD  hydrochlorothiazide (HYDRODIURIL) 25 MG tablet Take 25 mg by mouth daily.   Yes Historical Provider, MD  insulin aspart (NOVOLOG) 100 UNIT/ML injection Inject 10 Units into the skin 3 (three) times daily before meals.   Yes Historical Provider, MD  Insulin Glargine 300 UNIT/ML SOPN Inject 40 Units into the skin at bedtime.   Yes Historical Provider, MD  lisinopril (PRINIVIL,ZESTRIL) 40 MG tablet Take 1 tablet (40 mg total) by mouth daily. 04/03/12  Yes Samuella Cota, MD  Meloxicam (MOBIC PO) Take 1 tablet by mouth daily.   Yes Historical Provider, MD  metFORMIN (GLUCOPHAGE) 1000 MG tablet Take 1,000 mg by mouth 2 (two) times daily with a meal.   Yes Historical Provider, MD  oxyCODONE-acetaminophen (PERCOCET/ROXICET) 5-325 MG per tablet Take 1-2 tablets by mouth every 4 (four) hours as needed for severe pain. 06/18/14  Yes Shari Upstill, PA-C  ranitidine (ZANTAC) 150 MG tablet Take 150 mg by mouth 2 (two) times daily.   Yes Historical Provider, MD  valACYclovir (VALTREX) 500 MG tablet  Take 500 mg by mouth daily as needed (outbreak).   Yes Historical Provider, MD  atorvastatin (LIPITOR) 20 MG tablet Take 20 mg by mouth daily.    Historical Provider, MD  cephALEXin (KEFLEX) 500 MG capsule Take 1 capsule (500 mg total) by mouth 4 (four) times daily. Patient not taking: Reported on 10/02/2014 06/18/14   Charlann Lange, PA-C  insulin glargine (LANTUS) 100 UNIT/ML injection Inject 32 Units into the skin 2 (two) times daily. Take Morning & Evening.    Historical Provider, MD  Allergies  Allergen Reactions  . Morphine And Related     itching  . Shellfish Allergy     swelling    Physical Exam  Vitals  Blood pressure 175/78, pulse 82, temperature 97.8 F (36.6 C), temperature source Oral, resp. rate 20, height 5\' 8"  (1.727 m), weight 274 lb (124.286 kg), SpO2 100 %.   General:  Currently without chest pain so in no acute distress, appears healthy and well nourished  Psych:  Normal affect although clearly concerned about current symptoms, Denies Suicidal or Homicidal ideations, Awake Alert, Oriented X 3. Speech and thought patterns are clear and appropriate, no apparent short term memory deficits  Neuro:   No focal neurological deficits, CN II through XII intact, Strength 5/5 all 4 extremities, Sensation intact all 4 extremities.  ENT:  Ears and Eyes appear Normal, Conjunctivae clear, PER. Moist oral mucosa without erythema or exudates.  Neck:  Supple, No lymphadenopathy appreciated  Respiratory:  Symmetrical chest wall movement, Good air movement bilaterally, the auscultation diffusely with right basilar crackles and to a lesser extent left crackles, 2 liters oxygen  Cardiac:  RRR, No Murmurs, 1- 2+ bilateral LE edema noted, no JVD, No carotid bruits, peripheral pulses palpable at 2+  Abdomen:  Positive bowel sounds, Soft, Non tender, Non distended,  No masses appreciated, no obvious hepatosplenomegaly  Skin:  No Cyanosis, Normal Skin Turgor, No Skin Rash or  Bruise.  Extremities: Symmetrical without obvious trauma or injury,  no effusions.  Data Review  CBC  Recent Labs Lab 10/02/14 0433  WBC 7.5  HGB 10.6*  HCT 33.1*  PLT 234  MCV 81.9  MCH 26.2  MCHC 32.0  RDW 15.2  LYMPHSABS 1.3  MONOABS 0.4  EOSABS 0.2  BASOSABS 0.0    Chemistries   Recent Labs Lab 10/02/14 0433  NA 139  K 3.9  CL 106  CO2 24  GLUCOSE 272*  BUN 22  CREATININE 1.05  CALCIUM 9.0    estimated creatinine clearance is 83.2 mL/min (by C-G formula based on Cr of 1.05).  No results for input(s): TSH, T4TOTAL, T3FREE, THYROIDAB in the last 72 hours.  Invalid input(s): FREET3  Coagulation profile No results for input(s): INR, PROTIME in the last 168 hours.  No results for input(s): DDIMER in the last 72 hours.  Cardiac Enzymes No results for input(s): CKMB, TROPONINI, MYOGLOBIN in the last 168 hours.  Invalid input(s): CK  Invalid input(s): POCBNP  Urinalysis    Component Value Date/Time   COLORURINE YELLOW 05/29/2010 2100   APPEARANCEUR CLEAR 05/29/2010 2100   LABSPEC 1.013 05/29/2010 2100   PHURINE 5.5 05/29/2010 2100   GLUCOSEU NEGATIVE 05/29/2010 2100   HGBUR NEGATIVE 05/29/2010 2100   [place] NEGATIVE 05/29/2010 2100   KETONESUR NEGATIVE 05/29/2010 2100   PROTEINUR 30* 05/29/2010 2100   UROBILINOGEN 0.2 05/29/2010 2100   NITRITE NEGATIVE 05/29/2010 2100   LEUKOCYTESUR NEGATIVE 05/29/2010 2100    Imaging results:   Dg Chest 2 View  10/02/2014   CLINICAL DATA:  Left chest pain, wheezing, dyspnea  EXAM: CHEST  2 VIEW  COMPARISON:  03/24/2012  FINDINGS: Mild basilar airspace opacities are present, infectious versus atelectatic no large effusions. Heart size is normal and unchanged. Pulmonary vasculature is normal.  IMPRESSION: Mild basilar infiltrate or atelectasis   Electronically Signed   By: Andreas Newport M.D.   On: 10/02/2014 05:24     EKG: Sinus rhythm with no acute ischemic changes and borderline prolonged QTC 491  ms  Assessment & Plan  Principal Problem:   Chest pain -Admit to telemetry -Heart score is 6: Cardiac risk factors include hypertension, diabetes, obesity, dyslipidemia and positive family history as well as remote tobacco abuse -Symptoms concerning for possible unstable angina so have consulted cardiology -Nothing by mouth;  likely could go for either stress test today or cardiac catheterization -begin Full dose IV heparin with pharmacy dosing -Begin 1 inch Nitropaste -Continue home aspirin, statin, carvedilol -Cycle cardiac enzymes -Check echocardiogram  Active Problems:   Acute on chronic diastolic congestive heart failure, NYHA class 2/Acute respiratory failure with hypoxia -Chest x-[place] and exam concerning for acute heart failure exacerbation -We'll give one-time dose IV Lasix and monitor response; concerned may be related to ischemic equivalent but likely will need additional doses of Lasix -Because patient is experiencing exertional chest pain with shortness of breath will insert Foley catheter -Check daily weights and intake and output -Treat underlying uncontrolled hypertension -Nitrate order today for chest pain-also help with afterload reduction -Continue ACE inhibitor -ck Echocardiogram re ? RWMA    Uncontrolled hypertension -Resume home medications as above except for hydrochlorothiazide -Likely chest discomfort and volume overload contributing    Anemia -Hemoglobin 10.2 and at baseline    DM2 (diabetes mellitus, type 2) -Currently uncontrolled and likely related to stress response -Continue Lantus -Check CBGs every 4 hours while nothing by mouth and provide sliding scale coverage -Check hemoglobin A1c    DVT Prophylaxis: Full dose heparin  Family Communication:   No family at bedside  Code Status:  Full code  Condition: Stable   Time spent in minutes : 60   ELLIS,ALLISON L. ANP on 10/02/2014 at 7:50 AM  Between 7am to 7pm - Pager -  [phone]  After 7pm go to [email or web address] - password [email or web address]  And look for the night coverage person covering me after hours  [facility]

## 2014-10-02 NOTE — ED Notes (Signed)
Patient requested to ambulate to the BR.  Ambulated 35 feet and became SOB with CP and audible wheezing.  Pushed back to the room in a rolling chair. Pulse ox 85% RA

## 2014-10-02 NOTE — ED Notes (Addendum)
Per Cards pt is able to eat. No stress test needed, only echo. Pt up to bedside toilet, denies chest pain with exertion at this time, only sob, sats maintained at 100% with 2 L. Pt weighed before urination 278.7 lbs.  Order for foley catheter at this time is unnecessary, pt gets up to bedside toilet to use restroom without any difficulty, sats are maintained at 100% and pt denies chest pain. Will continue to monitor. If pt begins having chest pain with exertion then per hospitalist I will insert foley.

## 2014-10-02 NOTE — Progress Notes (Signed)
VASCULAR LAB PRELIMINARY  PRELIMINARY  PRELIMINARY  PRELIMINARY  BLEV completed.    Preliminary report:  Negative for DVT bilaterally.  Negative Baker's Cyst(s) bilaterally.  August Albino, RVT 10/02/2014, 1:48 PM

## 2014-10-03 DIAGNOSIS — I509 Heart failure, unspecified: Secondary | ICD-10-CM

## 2014-10-03 DIAGNOSIS — D649 Anemia, unspecified: Secondary | ICD-10-CM

## 2014-10-03 DIAGNOSIS — R0789 Other chest pain: Secondary | ICD-10-CM

## 2014-10-03 DIAGNOSIS — R0602 Shortness of breath: Secondary | ICD-10-CM | POA: Insufficient documentation

## 2014-10-03 LAB — CBC
HCT: 32.9 % — ABNORMAL LOW (ref 36.0–46.0)
Hemoglobin: 10.8 g/dL — ABNORMAL LOW (ref 12.0–15.0)
MCH: 26.2 pg (ref 26.0–34.0)
MCHC: 32.8 g/dL (ref 30.0–36.0)
MCV: 79.7 fL (ref 78.0–100.0)
Platelets: 251 10*3/uL (ref 150–400)
RBC: 4.13 MIL/uL (ref 3.87–5.11)
RDW: 14.7 % (ref 11.5–15.5)
WBC: 7.9 10*3/uL (ref 4.0–10.5)

## 2014-10-03 LAB — TROPONIN I: Troponin I: 0.03 ng/mL (ref <0.031)

## 2014-10-03 LAB — LEGIONELLA ANTIGEN, URINE

## 2014-10-03 LAB — GLUCOSE, CAPILLARY
Glucose-Capillary: 269 mg/dL — ABNORMAL HIGH (ref 70–99)
Glucose-Capillary: 276 mg/dL — ABNORMAL HIGH (ref 70–99)
Glucose-Capillary: 305 mg/dL — ABNORMAL HIGH (ref 70–99)
Glucose-Capillary: 311 mg/dL — ABNORMAL HIGH (ref 70–99)
Glucose-Capillary: 340 mg/dL — ABNORMAL HIGH (ref 70–99)
Glucose-Capillary: 344 mg/dL — ABNORMAL HIGH (ref 70–99)

## 2014-10-03 LAB — HIV ANTIBODY (ROUTINE TESTING W REFLEX): HIV Screen 4th Generation wRfx: NONREACTIVE

## 2014-10-03 MED ORDER — FUROSEMIDE 10 MG/ML IJ SOLN
40.0000 mg | Freq: Once | INTRAMUSCULAR | Status: AC
  Administered 2014-10-03: 40 mg via INTRAVENOUS

## 2014-10-03 MED ORDER — METHYLPREDNISOLONE SODIUM SUCC 125 MG IJ SOLR
60.0000 mg | Freq: Every day | INTRAMUSCULAR | Status: DC
  Administered 2014-10-04: 60 mg via INTRAVENOUS
  Filled 2014-10-03: qty 0.96

## 2014-10-03 MED ORDER — ALBUTEROL SULFATE (2.5 MG/3ML) 0.083% IN NEBU: 2.5000 mg | INHALATION_SOLUTION | RESPIRATORY_TRACT | Status: DC | PRN

## 2014-10-03 MED ORDER — ALBUTEROL SULFATE HFA 108 (90 BASE) MCG/ACT IN AERS: 2.0000 | INHALATION_SPRAY | RESPIRATORY_TRACT | Status: DC | PRN

## 2014-10-03 MED ORDER — CARVEDILOL 6.25 MG PO TABS
18.7500 mg | ORAL_TABLET | Freq: Two times a day (BID) | ORAL | Status: DC
  Administered 2014-10-03 – 2014-10-04 (×2): 18.75 mg via ORAL
  Filled 2014-10-03 (×4): qty 1

## 2014-10-03 MED ORDER — FUROSEMIDE 40 MG PO TABS
40.0000 mg | ORAL_TABLET | Freq: Every day | ORAL | Status: DC
  Filled 2014-10-03: qty 1

## 2014-10-03 MED ORDER — FUROSEMIDE 40 MG PO TABS
40.0000 mg | ORAL_TABLET | Freq: Every day | ORAL | Status: DC
  Administered 2014-10-04: 40 mg via ORAL
  Filled 2014-10-03: qty 1

## 2014-10-03 NOTE — Progress Notes (Signed)
[facility]                                                                              Patient Demographics  [name], is a 59 y.o. female, DOB - April 04, 1956, [id number]  Admit date - 10/02/2014   Admitting Physician Evalee Mutton Kristeen Mans, MD  Outpatient Primary MD for the patient is Heather Junior, MD  LOS - 1   Chief Complaint  Patient presents with  . Shortness of Breath  . Chest Pain      HPI on 10/02/2014 by [name], NP with Dr. Oren Binet 59 year old female patient with known history diabetes, hypertension, grade 2 diastolic dysfunction, and issues with progressive lower extremity edema ongoing for about 1 year. The patient reports that yesterday she traveled to [place] and while she was ambulating there develop shortness of breath and became very very tired. She also noted that she was experiencing exertional chest pain as well. She reports that this level of activity was higher than her baseline. By the time she returned home from her trip she was completely exhausted and stated she was not hungry and was unable to eat supper Patient slept for short period of time and then awakened. When she began to walk around her home she once again began having significant chest discomfort and shortness of breath and extreme fatigue and she was barely able to ambulate. She reports the symptoms as waxing and waning since their onset with the chest pain never really resolving. The initial symptoms regarding the chest pain were level 3/10 and at one point at home the pain became significant and it was 10/10. She also had an instance while exerting where she had chest pain radiating to her left arm. She reports experiencing cold sweats and nausea associated with the chest discomfort. Because of the significant symptoms with weakness patient called EMS to her home. She had similar but less severe symptoms several years ago. She reports the pain as being located in her mid chest  radiating into the left breast region and has reported occasionally and in the left arm. She is currently chest pain-free. When questioning she recalls that this past Saturday she had mild vague chest pain and dyspnea on exertion. She initially said about recurrent chest pain may be related to her history of pancreatitis but states current symptoms are not typical of when she had the pancreatitis. In the ER patient was afebrile at presentation but had marked hypertension with a BP of 192/79, her heart rate was 77. Because of chest pain she had previously been placed on 2 L nasal cannula oxygen by the paramedics. Her EKG was unremarkable except for borderline prolonged QTC at 491 ms. No definitive ischemic changes. BNP was mildly elevated at 122. Initial troponin was negative at 0.00. Labs were essentially unremarkable except for glucose 272 and hemoglobin of 10.6 which is at the patient's baseline. Chest x-[place] demonstrated mild basilar airspace opacities.  Assessment & Plan   Chest pain -Troponins cycled and found to be negative -Cardiology consulted and appreciated -Continue aspirin, Coreg, Lasix, lisinopril, atorvastatin -Echocardiogram showed EF [id number], grade 1 diastolic dysfunction -D-dimer was negative, lower extreme Doppler negative, unlikely PE  related  Acute on chronic diastolic heart failure -Echocardiogram EF [id number], grade 1 diastolic dysfunction -Cardiology following, recommended 1 more day of IV Lasix, changed to by mouth Lasix 10/04/2014  -Continue IV Lasix -Will monitor daily weights, intake and output  Acute respiratory failure with hypoxia -Likely multifactorial including acute diastolic heart failure versus community-acquired pneumonia  -Improved slightly -Treatment plan as stated above  Community for pneumonia  -Chest x-[place] showed mild basilar airspace opacities -Continue azithromycin and ceftriaxone  Uncontrolled hypertension -Continue Coreg, Lasix, lisinopril, amlodipine    Normocytic Anemia -Baseline hemoglobin appears to be 11, currently 10.8 -Continue to monitor CBC  Diabetes mellitus, type II -Continue insulin sliding scale CBG monitoring -Hyperglycemia likely due to steroids  Code Status: Full  Family Communication: None at bedside   Disposition Plan: Admitted, pending further diuresis   Time Spent in minutes   30  minutes  Procedures  Echocardiogram   Consults   Cardiology   DVT Prophylaxis  heparin   Lab Results  Component Value Date   PLT 251 10/03/2014    Medications  Scheduled Meds: . amLODipine  10 mg Oral Daily  . aspirin  325 mg Oral Daily  . atorvastatin  20 mg Oral Daily  . azithromycin  500 mg Intravenous Q24H  . carvedilol  18.75 mg Oral BID WC  . cefTRIAXone (ROCEPHIN)  IV  1 g Intravenous Q24H  . enoxaparin (LOVENOX) injection  40 mg Subcutaneous Q24H  . famotidine  10 mg Oral QHS  . [START ON 10/04/2014] furosemide  40 mg Oral Daily  . Influenza vac split quadrivalent PF  0.5 mL Intramuscular Tomorrow-1000  . insulin aspart  0-15 Units Subcutaneous 6 times per day  . insulin glargine  40 Units Subcutaneous QHS  . lisinopril  40 mg Oral Daily  . methylPREDNISolone (SOLU-MEDROL) injection  60 mg Intravenous 3 times per day   Continuous Infusions: . albuterol Stopped (10/02/14 0744)   PRN Meds:.acetaminophen, albuterol, ALPRAZolam, gi cocktail, morphine injection, ondansetron (ZOFRAN) IV, oxyCODONE-acetaminophen, zolpidem  Antibiotics    Anti-infectives    Start     Dose/Rate Route Frequency Ordered Stop   10/03/14 0600  azithromycin (ZITHROMAX) 500 mg in dextrose 5 % 250 mL IVPB     500 mg 250 mL/hr over 60 Minutes Intravenous Every 24 hours 10/02/14 1524     10/02/14 1600  cefTRIAXone (ROCEPHIN) 1 g in dextrose 5 % 50 mL IVPB - Premix     1 g 100 mL/hr over 30 Minutes Intravenous Every 24 hours 10/02/14 1524     10/02/14 0615  levofloxacin (LEVAQUIN) IVPB 750 mg     750 mg 100 mL/hr over 90 Minutes  Intravenous  Once 10/02/14 0602 10/02/14 0744        Subjective:   [name] seen and examined today.  Patient continues to feel some shortness of breath however he denies chest pain at this time. Patient denies abdominal pain, dizziness, headache .  Objective:   Filed Vitals:   10/02/14 1946 10/03/14 0425 10/03/14 1227 10/03/14 1228  BP: 176/75 188/86 160/84 160/84  Pulse: 77 76  74  Temp: 98.2 F (36.8 C) 97.6 F (36.4 C)    TempSrc: Oral Oral    Resp: 18 18    Height:      Weight:      SpO2: 98% 98%      Wt Readings from Last 3 Encounters:  10/02/14 127.8 kg (281 lb 12 oz)  04/01/12 125.329 kg (276 lb 4.8 oz)  02/07/12 126.1 kg (278 lb)     Intake/Output Summary (Last 24 hours) at 10/03/14 1352 Last data filed at 10/03/14 0426  Gross per 24 hour  Intake    240 ml  Output    800 ml  Net   -560 ml    Exam  General: Well developed, well nourished, NAD, appears stated age  58: NCAT,  mucous membranes moist.   Cardiovascular: S1 S2 auscultated, 2/6 SEM, RRR  Respiratory: Scattered radial, good air movement  Abdomen: Soft, obese, nontender, nondistended, + bowel sounds  Extremities: warm dry without cyanosis clubbing. Trace LE edema B/L   Neuro: AAOx3nonfocalin: Without rashes exudates or nodules  Psych: Normal affect and demeanor   Data Review   Micro Results No results found for this or any previous visit (from the past 240 hour(s)).  Radiology Reports Dg Chest 2 View  10/02/2014   CLINICAL DATA:  Left chest pain, wheezing, dyspnea  EXAM: CHEST  2 VIEW  COMPARISON:  03/24/2012  FINDINGS: Mild basilar airspace opacities are present, infectious versus atelectatic no large effusions. Heart size is normal and unchanged. Pulmonary vasculature is normal.  IMPRESSION: Mild basilar infiltrate or atelectasis   Electronically Signed   By: Andreas Newport M.D.   On: 10/02/2014 05:24    CBC  Recent Labs Lab 10/02/14 0433 10/03/14 0437  WBC 7.5 7.9    HGB 10.6* 10.8*  HCT 33.1* 32.9*  PLT 234 251  MCV 81.9 79.7  MCH 26.2 26.2  MCHC 32.0 32.8  RDW 15.2 14.7  LYMPHSABS 1.3  --   MONOABS 0.4  --   EOSABS 0.2  --   BASOSABS 0.0  --     Chemistries   Recent Labs Lab 10/02/14 0433  NA 139  K 3.9  CL 106  CO2 24  GLUCOSE 272*  BUN 22  CREATININE 1.05  CALCIUM 9.0   ------------------------------------------------------------------------------------------------------------------ estimated creatinine clearance is 84.5 mL/min (by C-G formula based on Cr of 1.05). ------------------------------------------------------------------------------------------------------------------ No results for input(s): HGBA1C in the last 72 hours. ------------------------------------------------------------------------------------------------------------------ No results for input(s): CHOL, HDL, LDLCALC, TRIG, CHOLHDL, LDLDIRECT in the last 72 hours. ------------------------------------------------------------------------------------------------------------------ No results for input(s): TSH, T4TOTAL, T3FREE, THYROIDAB in the last 72 hours.  Invalid input(s): FREET3 ------------------------------------------------------------------------------------------------------------------ No results for input(s): VITAMINB12, FOLATE, FERRITIN, TIBC, IRON, RETICCTPCT in the last 72 hours.  Coagulation profile No results for input(s): INR, PROTIME in the last 168 hours.   Recent Labs  10/02/14 1026  DDIMER 0.34    Cardiac Enzymes  Recent Labs Lab 10/02/14 1026 10/02/14 2032 10/02/14 2322  TROPONINI <0.03 <0.03 0.03   ------------------------------------------------------------------------------------------------------------------ Invalid input(s): POCBNP    Leetta Hendriks D.O. on 10/03/2014 at 1:52 PM  Between 7am to 7pm - Pager - [phone]  After 7pm go to [email or web address] - password [email or web address]  And look for the night coverage person  covering for me after hours  [facility] Office  [phone]

## 2014-10-03 NOTE — Progress Notes (Signed)
Patient Name: [name] Date of Encounter: 10/03/2014  Principal Problem:   Chest pain Active Problems:   Uncontrolled hypertension   DM2 (diabetes mellitus, type 2)   Acute on chronic diastolic congestive heart failure, NYHA class 2   Anemia   Acute respiratory failure with hypoxia   Chest pain on exertion   Primary Cardiologist: [place], Dr. Irish Lack  Patient Profile: 59 yo female w/ remote hx tob (quit '82), ETOH/Crack cocaine use (quit '97), CHF (unknown type), but no PCI, was admitted 03/22 for chest pain, SOB, HTN  SUBJECTIVE: Breathing much better. Will get SOB w/ activity and get chest tightness when she gets back to bed to rest. Edema improved.   OBJECTIVE Filed Vitals:   10/02/14 1430 10/02/14 1615 10/02/14 1946 10/03/14 0425  BP: 164/76 164/76 176/75 188/86  Pulse: 66 69 77 76  Temp:  98.7 F (37.1 C) 98.2 F (36.8 C) 97.6 F (36.4 C)  TempSrc:  Oral Oral Oral  Resp: 13 18 18 18   Height:  5\' 8"  (1.727 m)    Weight:  281 lb 12 oz (127.8 kg)    SpO2: 100% 99% 98% 98%    Intake/Output Summary (Last 24 hours) at 10/03/14 1128 Last data filed at 10/03/14 0426  Gross per 24 hour  Intake    240 ml  Output    800 ml  Net   -560 ml   Filed Weights   10/02/14 0413 10/02/14 0825 10/02/14 1615  Weight: 274 lb (124.286 kg) 278 lb 11.2 oz (126.417 kg) 281 lb 12 oz (127.8 kg)    PHYSICAL EXAM General: Well developed, well nourished, female in no acute distress. Head: Normocephalic, atraumatic.  Neck: Supple without bruits, JVD minimal elevation. Lungs:  Resp regular and unlabored, very few rales, good air exchange. Heart: RRR, S1, S2, no S3, S4, soft murmur; no rub. Abdomen: Soft, non-tender, non-distended, BS + x 4.  Extremities: No clubbing, cyanosis, 1+ edema.  Neuro: Alert and oriented X 3. Moves all extremities spontaneously. Psych: Normal affect.  LABS: CBC: Recent Labs  10/02/14 0433 10/03/14 0437  WBC 7.5 7.9  NEUTROABS 5.8  --   HGB 10.6*  10.8*  HCT 33.1* 32.9*  MCV 81.9 79.7  PLT 234 [id number]   Basic Metabolic Panel: Recent Labs  10/02/14 0433  NA 139  K 3.9  CL 106  CO2 24  GLUCOSE 272*  BUN 22  CREATININE 1.05  CALCIUM 9.0   Cardiac Enzymes: Recent Labs  10/02/14 1026 10/02/14 2032 10/02/14 2322  TROPONINI <0.03 <0.03 0.03    Recent Labs  10/02/14 0436  TROPIPOC 0.00   BNP:  B NATRIURETIC PEPTIDE  Date/Time Value Ref Range Status  10/02/2014 04:34 AM 122.5* 0.0 - 100.0 pg/mL Final   D-dimer: Recent Labs  10/02/14 1026  DDIMER 0.34    TELE: SR, no sig ectopy       ECHO: 03/232016 - Left ventricle: The cavity size was normal. There was mild focal basal hypertrophy of the septum. Systolic function was vigorous. The estimated ejection fraction was in the range of 65% to 70%. Wall motion was normal; there were no regional wall motion abnormalities. Doppler parameters are consistent with abnormal left ventricular relaxation (grade 1 diastolic dysfunction). Doppler parameters are consistent with high ventricular filling pressure. - Aortic valve: There was mild stenosis, likely secondary to narrow aortic valve annulus. Valve leaflets open well. There was mild regurgitation. Peak velocity (S): 271 cm/s. Mean gradient (S): 15 mm  Hg. Valve area (VTI): 1.73 cm^2. Valve area (Vmax): 1.56 cm^2. Valve area (Vmean): 1.71 cm^2. - Mitral valve: Calcified annulus. Impressions: - Compared to the prior study, there has been no significant interval change.  Radiology/Studies: Dg Chest 2 View 10/02/2014   CLINICAL DATA:  Left chest pain, wheezing, dyspnea  EXAM: CHEST  2 VIEW  COMPARISON:  03/24/2012  FINDINGS: Mild basilar airspace opacities are present, infectious versus atelectatic no large effusions. Heart size is normal and unchanged. Pulmonary vasculature is normal.  IMPRESSION: Mild basilar infiltrate or atelectasis   Electronically Signed   By: Andreas Newport M.D.   On:  10/02/2014 05:24    Current Medications:  . amLODipine  10 mg Oral Daily  . aspirin  325 mg Oral Daily  . atorvastatin  20 mg Oral Daily  . azithromycin  500 mg Intravenous Q24H  . carvedilol  12.5 mg Oral BID WC  . cefTRIAXone (ROCEPHIN)  IV  1 g Intravenous Q24H  . enoxaparin (LOVENOX) injection  40 mg Subcutaneous Q24H  . famotidine  10 mg Oral QHS  . Influenza vac split quadrivalent PF  0.5 mL Intramuscular Tomorrow-1000  . insulin aspart  0-15 Units Subcutaneous 6 times per day  . insulin glargine  40 Units Subcutaneous QHS  . lisinopril  40 mg Oral Daily  . methylPREDNISolone (SOLU-MEDROL) injection  60 mg Intravenous 3 times per day   . albuterol Stopped (10/02/14 0744)    ASSESSMENT AND PLAN: Principal Problem: 1. Chest pain - ez neg MI - SOB comes first - on ASA, BB, ACE, statin - Echo done, EF preserved w/ only grade 1 diast dysf  Active Problems: 2.  Uncontrolled hypertension - per IM  - SBP 160s-180s - HR not low, will increase Coreg 12.5>>18.75 mg bid - add PO Lasix  3.  DM2 (diabetes mellitus, type 2) - per IM  4.  Acute on chronic diastolic congestive heart failure, NYHA class 2 - s/p Lasix 40 mg IV x 1 - symptoms improved, ambulate and ck sats w/ ambulation - 03/22 O2 dropped to 85% w/ ambulation - PTA on HCTZ 25 mg daily, would change to Lasix at 40 mg daily    Anemia - Per IM    Acute respiratory failure with hypoxia - per IM - d-dimer negative    Chest pain on exertion - see above  Signed, Rosaria Ferries , PA-C 11:28 AM 10/03/2014   Patient seen, examined. Available data reviewed. Agree with findings, assessment, and plan as outlined by Rosaria Ferries, PA-C. The patient is independently interviewed and examined. She has an alert, oriented, pleasant woman in no distress. The patient is obese. Lungs are clear. Heart is regular rate and rhythm with a grade 2/6 ejection murmur at the left sternal border. There is no peripheral edema. I have  reviewed her echocardiogram, chest x-Garate, and lab work. She has had poorly controlled hypertension. I suspect her symptoms are related to diastolic heart failure. Recommend another dose of IV Lasix followed by initiation of oral Lasix tomorrow. I think she should be ready for hospital discharge tomorrow. Today she still has significant shortness of breath with low level activity despite a paucity of physical exam findings. D-dimer negative. I have a low suspicion for ischemia and do not think she needs inpatient ischemic evaluation.  Sherren Mocha, M.D. 10/03/2014 12:57 PM

## 2014-10-03 NOTE — Progress Notes (Signed)
10/03/2014 1700 NCM spoke to pt and she goes to [facility] clinic. She would be willing to switch to the [facility]. States she has difficulty paying for her medications. She has applied for the MAP program with [facility]. She was in the program but move out of [place]. NCM will continue to follow up for appt at [facility] and [facility]. Pt states she has applied for disability twice and was denied. Her brother helps her pay for her meds when he is financially able. Jonnie Finner RN CCM Case Mgmt phone [phone]

## 2014-10-03 NOTE — Progress Notes (Signed)
10/03/2014 Pt. Ambulated 500 ft. on RA and maintained O2 sats between 98-100%. [place]

## 2014-10-03 NOTE — Progress Notes (Signed)
Utilization review completed.  

## 2014-10-03 NOTE — Progress Notes (Signed)
  Echocardiogram 2D Echocardiogram has been performed.  Lysle Rubens 10/03/2014, 11:35 AM

## 2014-10-03 NOTE — Progress Notes (Signed)
Inpatient Diabetes Program Recommendations  AACE/ADA: New Consensus Statement on Inpatient Glycemic Control (2013)  Target Ranges:  Prepandial:   less than 140 mg/dL      Peak postprandial:   less than 180 mg/dL (1-2 hours)      Critically ill patients:  140 - 180 mg/dL   Results for [name], [name] (MRN [id number]) as of 10/03/2014 09:06  Ref. Range 10/02/2014 07:38 10/02/2014 10:59 10/02/2014 12:08 10/02/2014 15:53 10/02/2014 20:58 10/03/2014 00:13 10/03/2014 04:21 10/03/2014 08:18  Glucose-Capillary Latest Range: 70-99 mg/dL 261 (H) 290 (H) 321 (H) 296 (H) 340 (H) 305 (H) 276 (H) 311 (H)   Reason for Visit: CP, CHF  Diabetes history: DM 2 Outpatient Diabetes medications: Lantus 40 units QHS, Novolog 10 units TID meal coverage, Metformin 1,000 mg BID Current orders for Inpatient glycemic control: Lantus 40 units QHS, Novolog 0-15 units Q4hrs  Inpatient Diabetes Program Recommendations  Please consider adjusting basal insulin to be given this am since blood glucose is in the 300's this am.   Insulin - Meal Coverage: Patient takes Novolog 10 units of meal coverage at home. Patient's glucose increased into the 300's around meal times yesterday. Please consider Novolog 5 units TID with meals for meal coverage in addition to correction if the patient is consuming at least 50% of meals.  Thanks,  Tama Headings RN, MSN, [facility] Inpatient Diabetes Coordinator Team Pager [phone]

## 2014-10-04 ENCOUNTER — Telehealth: Payer: Self-pay | Admitting: Interventional Cardiology

## 2014-10-04 LAB — BASIC METABOLIC PANEL
Anion gap: 9 (ref 5–15)
BUN: 51 mg/dL — ABNORMAL HIGH (ref 6–23)
CO2: 21 mmol/L (ref 19–32)
Calcium: 8 mg/dL — ABNORMAL LOW (ref 8.4–10.5)
Chloride: 103 mmol/L (ref 96–112)
Creatinine, Ser: 1.47 mg/dL — ABNORMAL HIGH (ref 0.50–1.10)
GFR calc Af Amer: 45 mL/min — ABNORMAL LOW (ref >90)
GFR calc non Af Amer: 39 mL/min — ABNORMAL LOW (ref >90)
Glucose, Bld: 293 mg/dL — ABNORMAL HIGH (ref 70–99)
Potassium: 4 mmol/L (ref 3.5–5.1)
Sodium: 133 mmol/L — ABNORMAL LOW (ref 135–145)

## 2014-10-04 LAB — BASIC METABOLIC PANEL WITH GFR
Anion gap: 10 (ref 5–15)
BUN: 48 mg/dL — ABNORMAL HIGH (ref 6–23)
CO2: 23 mmol/L (ref 19–32)
Calcium: 8.3 mg/dL — ABNORMAL LOW (ref 8.4–10.5)
Chloride: 103 mmol/L (ref 96–112)
Creatinine, Ser: 1.4 mg/dL — ABNORMAL HIGH (ref 0.50–1.10)
GFR calc Af Amer: 48 mL/min — ABNORMAL LOW
GFR calc non Af Amer: 41 mL/min — ABNORMAL LOW
Glucose, Bld: 264 mg/dL — ABNORMAL HIGH (ref 70–99)
Potassium: 4.1 mmol/L (ref 3.5–5.1)
Sodium: 136 mmol/L (ref 135–145)

## 2014-10-04 LAB — GLUCOSE, CAPILLARY
Glucose-Capillary: 168 mg/dL — ABNORMAL HIGH (ref 70–99)
Glucose-Capillary: 230 mg/dL — ABNORMAL HIGH (ref 70–99)
Glucose-Capillary: 251 mg/dL — ABNORMAL HIGH (ref 70–99)
Glucose-Capillary: 349 mg/dL — ABNORMAL HIGH (ref 70–99)

## 2014-10-04 LAB — CBC
HCT: 31.2 % — ABNORMAL LOW (ref 36.0–46.0)
Hemoglobin: 10.2 g/dL — ABNORMAL LOW (ref 12.0–15.0)
MCH: 26.2 pg (ref 26.0–34.0)
MCHC: 32.7 g/dL (ref 30.0–36.0)
MCV: 80 fL (ref 78.0–100.0)
Platelets: 242 10*3/uL (ref 150–400)
RBC: 3.9 MIL/uL (ref 3.87–5.11)
RDW: 14.7 % (ref 11.5–15.5)
WBC: 10 10*3/uL (ref 4.0–10.5)

## 2014-10-04 LAB — HEMOGLOBIN A1C
Hgb A1c MFr Bld: 8.9 % — ABNORMAL HIGH (ref 4.8–5.6)
Mean Plasma Glucose: 209 mg/dL

## 2014-10-04 MED ORDER — CARVEDILOL 25 MG PO TABS
25.0000 mg | ORAL_TABLET | Freq: Two times a day (BID) | ORAL | Status: DC
  Filled 2014-10-04 (×2): qty 1

## 2014-10-04 MED ORDER — LEVOFLOXACIN 750 MG PO TABS: 750.0000 mg | ORAL_TABLET | Freq: Every day | ORAL | Status: DC

## 2014-10-04 MED ORDER — CARVEDILOL 25 MG PO TABS: 25.0000 mg | ORAL_TABLET | Freq: Two times a day (BID) | ORAL | Status: DC

## 2014-10-04 MED ORDER — METHYLPREDNISOLONE (PAK) 4 MG PO TABS: ORAL_TABLET | ORAL | Status: DC

## 2014-10-04 NOTE — Discharge Instructions (Signed)
Pneumonia Pneumonia is an infection of the lungs.  CAUSES Pneumonia may be caused by bacteria or a virus. Usually, these infections are caused by breathing infectious particles into the lungs (respiratory tract). SIGNS AND SYMPTOMS   Cough.  Fever.  Chest pain.  Increased rate of breathing.  Wheezing.  Mucus production. DIAGNOSIS  If you have the common symptoms of pneumonia, your health care provider will typically confirm the diagnosis with a chest X-Toops. The X-Carandang will show an abnormality in the lung (pulmonary infiltrate) if you have pneumonia. Other tests of your blood, urine, or sputum may be done to find the specific cause of your pneumonia. Your health care provider may also do tests (blood gases or pulse oximetry) to see how well your lungs are working. TREATMENT  Some forms of pneumonia may be spread to other people when you cough or sneeze. You may be asked to wear a mask before and during your exam. Pneumonia that is caused by bacteria is treated with antibiotic medicine. Pneumonia that is caused by the influenza virus may be treated with an antiviral medicine. Most other viral infections must run their course. These infections will not respond to antibiotics.  HOME CARE INSTRUCTIONS   Cough suppressants may be used if you are losing too much rest. However, coughing protects you by clearing your lungs. You should avoid using cough suppressants if you can.  Your health care provider may have prescribed medicine if he or she thinks your pneumonia is caused by bacteria or influenza. Finish your medicine even if you start to feel better.  Your health care provider may also prescribe an expectorant. This loosens the mucus to be coughed up.  Take medicines only as directed by your health care provider.  Do not smoke. Smoking is a common cause of bronchitis and can contribute to pneumonia. If you are a smoker and continue to smoke, your cough may last several weeks after your  pneumonia has cleared.  A cold steam vaporizer or humidifier in your room or home may help loosen mucus.  Coughing is often worse at night. Sleeping in a semi-upright position in a recliner or using a couple pillows under your head will help with this.  Get rest as you feel it is needed. Your body will usually let you know when you need to rest. PREVENTION A pneumococcal shot (vaccine) is available to prevent a common bacterial cause of pneumonia. This is usually suggested for:  People over 65 years old.  Patients on chemotherapy.  People with chronic lung problems, such as bronchitis or emphysema.  People with immune system problems. If you are over 65 or have a high risk condition, you may receive the pneumococcal vaccine if you have not received it before. In some countries, a routine influenza vaccine is also recommended. This vaccine can help prevent some cases of pneumonia.You may be offered the influenza vaccine as part of your care. If you smoke, it is time to quit. You may receive instructions on how to stop smoking. Your health care provider can provide medicines and counseling to help you quit. SEEK MEDICAL CARE IF: You have a fever. SEEK IMMEDIATE MEDICAL CARE IF:   Your illness becomes worse. This is especially true if you are elderly or weakened from any other disease.  You cannot control your cough with suppressants and are losing sleep.  You begin coughing up blood.  You develop pain which is getting worse or is uncontrolled with medicines.  Any of the symptoms   which initially brought you in for treatment are getting worse rather than better.  You develop shortness of breath or chest pain. MAKE SURE YOU:   Understand these instructions.  Will watch your condition.  Will get help right away if you are not doing well or get worse. Document Released: 06/29/2005 Document Revised: 11/13/2013 Document Reviewed: 09/18/2010 [facility] Patient Information 2015  [place], [place]. This information is not intended to replace advice given to you by your health care provider. Make sure you discuss any questions you have with your health care provider. Chest Pain (Nonspecific) It is often hard to give a specific diagnosis for the cause of chest pain. There is always a chance that your pain could be related to something serious, such as a heart attack or a blood clot in the lungs. You need to follow up with your health care provider for further evaluation. CAUSES   Heartburn.  Pneumonia or bronchitis.  Anxiety or stress.  Inflammation around your heart (pericarditis) or lung (pleuritis or pleurisy).  A blood clot in the lung.  A collapsed lung (pneumothorax). It can develop suddenly on its own (spontaneous pneumothorax) or from trauma to the chest.  Shingles infection (herpes zoster virus). The chest wall is composed of bones, muscles, and cartilage. Any of these can be the source of the pain.  The bones can be bruised by injury.  The muscles or cartilage can be strained by coughing or overwork.  The cartilage can be affected by inflammation and become sore (costochondritis). DIAGNOSIS  Lab tests or other studies may be needed to find the cause of your pain. Your health care provider may have you take a test called an ambulatory electrocardiogram (ECG). An ECG records your heartbeat patterns over a 24-hour period. You may also have other tests, such as:  Transthoracic echocardiogram (TTE). During echocardiography, sound waves are used to evaluate how blood flows through your heart.  Transesophageal echocardiogram (TEE).  Cardiac monitoring. This allows your health care provider to monitor your heart rate and rhythm in real time.  Holter monitor. This is a portable device that records your heartbeat and can help diagnose heart arrhythmias. It allows your health care provider to track your heart activity for several days, if needed.  Stress tests by  exercise or by giving medicine that makes the heart beat faster. TREATMENT   Treatment depends on what may be causing your chest pain. Treatment may include:  Acid blockers for heartburn.  Anti-inflammatory medicine.  Pain medicine for inflammatory conditions.  Antibiotics if an infection is present.  You may be advised to change lifestyle habits. This includes stopping smoking and avoiding alcohol, caffeine, and chocolate.  You may be advised to keep your head raised (elevated) when sleeping. This reduces the chance of acid going backward from your stomach into your esophagus. Most of the time, nonspecific chest pain will improve within 2-3 days with rest and mild pain medicine.  HOME CARE INSTRUCTIONS   If antibiotics were prescribed, take them as directed. Finish them even if you start to feel better.  For the next few days, avoid physical activities that bring on chest pain. Continue physical activities as directed.  Do not use any tobacco products, including cigarettes, chewing tobacco, or electronic cigarettes.  Avoid drinking alcohol.  Only take medicine as directed by your health care provider.  Follow your health care provider's suggestions for further testing if your chest pain does not go away.  Keep any follow-up appointments you made. If you  do not go to an appointment, you could develop lasting (chronic) problems with pain. If there is any problem keeping an appointment, call to reschedule. SEEK MEDICAL CARE IF:   Your chest pain does not go away, even after treatment.  You have a rash with blisters on your chest.  You have a fever. SEEK IMMEDIATE MEDICAL CARE IF:   You have increased chest pain or pain that spreads to your arm, neck, jaw, back, or abdomen.  You have shortness of breath.  You have an increasing cough, or you cough up blood.  You have severe back or abdominal pain.  You feel nauseous or vomit.  You have severe weakness.  You  faint.  You have chills. This is an emergency. Do not wait to see if the pain will go away. Get medical help at once. Call your local emergency services (911 in [place] Do not drive yourself to the hospital. MAKE SURE YOU:   Understand these instructions.  Will watch your condition.  Will get help right away if you are not doing well or get worse. Document Released: 04/08/2005 Document Revised: 07/04/2013 Document Reviewed: 02/02/2008 [facility] Patient Information 2015 [place], [place]. This information is not intended to replace advice given to you by your health care provider. Make sure you discuss any questions you have with your health care provider.        To whom it concern:  [name]  was admitted to the Hospital from 10/02/2014 to 10/04/2014 and was discharged on 10/04/2014. [name] should be excused from work starting 10/02/2014 and may return to work without any restrictions on 10/08/2014.  Call [facility] office [phone] for any questions.  Sincerely, Cristal Ford, DO [facility] 10/04/2014, 11:17 AM

## 2014-10-04 NOTE — Discharge Summary (Signed)
Physician Discharge Summary  [name] [id number] DOB: 11/05/1955 DOA: 10/02/2014  PCP: Harvie Junior, MD  Admit date: 10/02/2014 Discharge date: 10/04/2014  Time spent: 45 minutes  Recommendations for Outpatient Follow-up:  Patient will be discharged to home. She will need to follow up with the [facility] in one week.  She will need a repeat BMP at that time.  She will need to continue her medications as scheduled.  Patient should follow a heart healthy diet/carb modified.  Discharge Diagnoses:  Chest pain  Acute on chronic diastolic heart failure Acute respiratory failure with hypoxia Community acquired pneumonia Uncontrolled hypertension Normocytic anemia Diabetes mellitus, type II Acute kidney injury  Discharge Condition: Stable  Diet recommendation: Heart healthy/carb modified  Filed Weights   10/02/14 0413 10/02/14 0825 10/02/14 1615  Weight: 124.286 kg (274 lb) 126.417 kg (278 lb 11.2 oz) 127.8 kg (281 lb 12 oz)    History of present illness:  on 10/02/2014 by [name], NP with Dr. Oren Binet 59 year old female patient with known history diabetes, hypertension, grade 2 diastolic dysfunction, and issues with progressive lower extremity edema ongoing for about 1 year. The patient reports that yesterday she traveled to [place] and while she was ambulating there develop shortness of breath and became very very tired. She also noted that she was experiencing exertional chest pain as well. She reports that this level of activity was higher than her baseline. By the time she returned home from her trip she was completely exhausted and stated she was not hungry and was unable to eat supper Patient slept for short period of time and then awakened. When she began to walk around her home she once again began having significant chest discomfort and shortness of breath and extreme fatigue and she was barely able to ambulate. She reports the  symptoms as waxing and waning since their onset with the chest pain never really resolving. The initial symptoms regarding the chest pain were level 3/10 and at one point at home the pain became significant and it was 10/10. She also had an instance while exerting where she had chest pain radiating to her left arm. She reports experiencing cold sweats and nausea associated with the chest discomfort. Because of the significant symptoms with weakness patient called EMS to her home. She had similar but less severe symptoms several years ago. She reports the pain as being located in her mid chest radiating into the left breast region and has reported occasionally and in the left arm. She is currently chest pain-free. When questioning she recalls that this past Saturday she had mild vague chest pain and dyspnea on exertion. She initially said about recurrent chest pain may be related to her history of pancreatitis but states current symptoms are not typical of when she had the pancreatitis. In the ER patient was afebrile at presentation but had marked hypertension with a BP of 192/79, her heart rate was 77. Because of chest pain she had previously been placed on 2 L nasal cannula oxygen by the paramedics. Her EKG was unremarkable except for borderline prolonged QTC at 491 ms. No definitive ischemic changes. BNP was mildly elevated at 122. Initial troponin was negative at 0.00. Labs were essentially unremarkable except for glucose 272 and hemoglobin of 10.6 which is at the patient's baseline. Chest x-Szumski demonstrated mild basilar airspace opacities.  Hospital Course:  Chest pain -Troponins cycled and found to be negative -Cardiology consulted and appreciated -Continue aspirin, Coreg, lisinopril, atorvastatin -  Initially placed on IV Lasix and transitioned to by mouth -Per cardiology, patient may need lasix PRN -Echocardiogram showed EF [id number], grade 1 diastolic dysfunction -D-dimer was negative, lower extreme  Doppler negative, unlikely PE related  Acute on chronic diastolic heart failure -Echocardiogram EF [id number], grade 1 diastolic dysfunction -Cardiology following, recommended 1 more day of IV Lasix, changed to by mouth Lasix 10/04/2014  -Continue IV Lasix -Will monitor daily weights, intake and output  Acute respiratory failure with hypoxia -Likely multifactorial including acute diastolic heart failure versus community-acquired pneumonia  -Improved -Will discharge patient with Medrol Dosepak -Treatment plan as stated above  Community acquired pneumonia  -Chest x-Oki showed mild basilar airspace opacities -Initially placed on azithromycin and ceftriaxone -Will discharge patient with Levaquin for the remainder of her antibiotic course  Uncontrolled hypertension -Continue Coreg, lisinopril, amlodipine  -Coreg increased to 25 mg twice a day, Lasix PRN  Normocytic Anemia -Baseline hemoglobin appears to be 11, currently 10.2  Diabetes mellitus, type II -metformin held -Patient initially placed on insulin sliding scale, continued her Lantus, CBG monitoring -Upon discharge, continue Lantus as well as sliding scale -Patient will need to speak with her primary care physician regarding her metformin, held due to acute kidney injury -Hyperglycemia likely due to steroids  Acute kidney injury -Likely secondary to Lasix versus ACE inhibitor -Will discontinue Lasix -Bump in creatinine to 1.4 -Patient will need a repeat BMP within 1 week of discharge.  Procedures: Echocardiogram  Consultations: Cardiology  Discharge Exam: Filed Vitals:   10/04/14 1014  BP: 146/86  Pulse:   Temp:   Resp:    Exam  General: Well developed, well nourished, NAD  Cardiovascular: S1 S2 auscultated, 2/6 SEM, RRR  Respiratory: Clear to auscultation bilaterally  Abdomen: Soft, obese, nontender, nondistended, + bowel sounds  Extremities: warm dry without cyanosis clubbing, or edema  Neuro: AAOx3,  nonfocal  Psych: Appropriate mood and affect  Discharge Instructions      Discharge Instructions    Discharge instructions    Complete by:  As directed   You were cared for by a hospitalist during your hospital stay. If you have any questions about your discharge medications or the care you received while you were in the hospital after you are discharged, you can call the unit and asked to speak with the hospitalist on call if the hospitalist that took care of you is not available. Once you are discharged, your primary care physician will handle any further medical issues. Please note that NO REFILLS for any discharge medications will be authorized once you are discharged, as it is imperative that you return to your primary care physician (or establish a relationship with a primary care physician if you do not have one) for your aftercare needs so that they can reassess your need for medications and monitor your lab values.  Patient will be discharged to home. She will need to follow up with the [facility] clinic in one week.  She will need a repeat BMP at that time.  She will need to continue her medications as scheduled.  Patient should follow a heart healthy diet/carb modified. Please speak to your primary care provider regarding restarting metformin as well as your blood pressure control.            Medication List    STOP taking these medications        cephALEXin 500 MG capsule  Commonly known as:  KEFLEX     hydrochlorothiazide 25 MG tablet  Commonly known as:  HYDRODIURIL     metFORMIN 1000 MG tablet  Commonly known as:  GLUCOPHAGE     MOBIC PO      TAKE these medications        amLODipine 10 MG tablet  Commonly known as:  NORVASC  Take 10 mg by mouth daily.     aspirin 325 MG tablet  Take 325 mg by mouth daily.     atorvastatin 20 MG tablet  Commonly known as:  LIPITOR  Take 20 mg by mouth daily.     carvedilol 25 MG tablet  Commonly known as:   COREG  Take 1 tablet (25 mg total) by mouth 2 (two) times daily with a meal.     GABAPENTIN PO  Take 1 tablet by mouth at bedtime.     insulin aspart 100 UNIT/ML injection  Commonly known as:  novoLOG  Inject 10 Units into the skin 3 (three) times daily before meals.     Insulin Glargine 300 UNIT/ML Sopn  Inject 40 Units into the skin at bedtime.     levofloxacin 750 MG tablet  Commonly known as:  LEVAQUIN  Take 1 tablet (750 mg total) by mouth daily.     lisinopril 40 MG tablet  Commonly known as:  PRINIVIL,ZESTRIL  Take 1 tablet (40 mg total) by mouth daily.     methylPREDNIsolone 4 MG tablet  Commonly known as:  MEDROL DOSPACK  follow package directions     oxyCODONE-acetaminophen 5-325 MG per tablet  Commonly known as:  PERCOCET/ROXICET  Take 1-2 tablets by mouth every 4 (four) hours as needed for severe pain.     PROAIR HFA 108 (90 BASE) MCG/ACT inhaler  Generic drug:  albuterol  Inhale 2 puffs into the lungs every 4 (four) hours as needed for wheezing.     ranitidine 150 MG tablet  Commonly known as:  ZANTAC  Take 150 mg by mouth 2 (two) times daily.     valACYclovir 500 MG tablet  Commonly known as:  VALTREX  Take 500 mg by mouth daily as needed (outbreak).       Allergies  Allergen Reactions  . Morphine And Related     itching  . Shellfish Allergy     swelling   Follow-up Information    Follow up with HAGER, BRYAN, PA-C On 10/11/2014.   Specialty:  Physician Assistant   Why:  @ 9am    Contact information:   [address] [place] [address] [phone]        The results of significant diagnostics from this hospitalization (including imaging, microbiology, ancillary and laboratory) are listed below for reference.    Significant Diagnostic Studies: Dg Chest 2 View  10/02/2014   CLINICAL DATA:  Left chest pain, wheezing, dyspnea  EXAM: CHEST  2 VIEW  COMPARISON:  03/24/2012  FINDINGS: Mild basilar airspace opacities are present,  infectious versus atelectatic no large effusions. Heart size is normal and unchanged. Pulmonary vasculature is normal.  IMPRESSION: Mild basilar infiltrate or atelectasis   Electronically Signed   By: Andreas Newport M.D.   On: 10/02/2014 05:24    Microbiology: Recent Results (from the past 240 hour(s))  Culture, blood (routine x 2)     Status: None (Preliminary result)   Collection Time: 10/02/14  8:32 PM  Result Value Ref Range Status   Specimen Description BLOOD LEFT ARM  Final   Special Requests BOTTLES DRAWN AEROBIC AND ANAEROBIC 5CC  Final  Culture   Final           BLOOD CULTURE RECEIVED NO GROWTH TO DATE CULTURE WILL BE HELD FOR 5 DAYS BEFORE ISSUING A FINAL NEGATIVE REPORT Performed at [facility]    Report Status PENDING  Incomplete  Culture, blood (routine x 2)     Status: None (Preliminary result)   Collection Time: 10/02/14  8:42 PM  Result Value Ref Range Status   Specimen Description BLOOD LEFT HAND  Final   Special Requests BOTTLES DRAWN AEROBIC ONLY 2CC  Final   Culture   Final           BLOOD CULTURE RECEIVED NO GROWTH TO DATE CULTURE WILL BE HELD FOR 5 DAYS BEFORE ISSUING A FINAL NEGATIVE REPORT Performed at [facility]    Report Status PENDING  Incomplete     Labs: Basic Metabolic Panel:  Recent Labs Lab 10/02/14 0433 10/04/14 0353  NA 139 133*  K 3.9 4.0  CL 106 103  CO2 24 21  GLUCOSE 272* 293*  BUN 22 51*  CREATININE 1.05 1.47*  CALCIUM 9.0 8.0*   Liver Function Tests: No results for input(s): AST, ALT, ALKPHOS, BILITOT, PROT, ALBUMIN in the last 168 hours. No results for input(s): LIPASE, AMYLASE in the last 168 hours. No results for input(s): AMMONIA in the last 168 hours. CBC:  Recent Labs Lab 10/02/14 0433 10/03/14 0437 10/04/14 0353  WBC 7.5 7.9 10.0  NEUTROABS 5.8  --   --   HGB 10.6* 10.8* 10.2*  HCT 33.1* 32.9* 31.2*  MCV 81.9 79.7 80.0  PLT 234 251 242   Cardiac Enzymes:  Recent Labs Lab  10/02/14 1026 10/02/14 2032 10/02/14 2322  TROPONINI <0.03 <0.03 0.03   BNP: BNP (last 3 results)  Recent Labs  10/02/14 0434  BNP 122.5*    ProBNP (last 3 results) No results for input(s): PROBNP in the last 8760 hours.  CBG:  Recent Labs Lab 10/03/14 1637 10/03/14 2027 10/04/14 0031 10/04/14 0354 10/04/14 0813  GLUCAP 269* 344* 349* 251* 168*       Signed:  Cristal Ford  [facility] 10/04/2014, 10:22 AM

## 2014-10-04 NOTE — Progress Notes (Signed)
    Subjective:  Breathing improved. Able to walk without significant shortness of breath. Swelling also improved.   Objective:  Vital Signs in the last 24 hours: Temp:  [97.5 F (36.4 C)-97.8 F (36.6 C)] 97.8 F (36.6 C) (03/24 0357) Pulse Rate:  [55-74] 64 (03/24 0910) Resp:  [18] 18 (03/24 0357) BP: (145-177)/(63-84) 145/63 mmHg (03/24 0910) SpO2:  [97 %-100 %] 97 % (03/24 0357)  Intake/Output from previous day:    Physical Exam: Pt is alert and oriented, NAD HEENT: normal Neck: JVP - normal, carotids 2+= without bruits Lungs: CTA bilaterally CV: RRR with 2/6 systolic murmur at the LSB Abd: soft, NT, Positive BS, no hepatomegaly Ext: no C/C/E, distal pulses intact and equal Skin: warm/dry no rash   Lab Results:  Recent Labs  10/03/14 0437 10/04/14 0353  WBC 7.9 10.0  HGB 10.8* 10.2*  PLT 251 242    Recent Labs  10/02/14 0433 10/04/14 0353  NA 139 133*  K 3.9 4.0  CL 106 103  CO2 24 21  GLUCOSE 272* 293*  BUN 22 51*  CREATININE 1.05 1.47*    Recent Labs  10/02/14 2032 10/02/14 2322  TROPONINI <0.03 0.03    Assessment/Plan:  1. Acute diastolic heart failure, improved with diuresis/BP management 2. AKI - likely combination of ACE-I/IV lasix 3. Malignant HTN  As per prior note, echo/enzymes/d-dimer all negative. Main issue will be BP control, medication adherence. Would stop lasix as a scheduled drug and use only as needed. Increase carvedilol to 25 mg BID. Will defer to [facility] team but if d/c today would arrange BMET next week. I will arrange a follow-up visit with Dr Irish Lack or his PA/NP in 1 week.   Sherren Mocha, M.D. 10/04/2014, 9:25 AM

## 2014-10-04 NOTE — Telephone Encounter (Signed)
[name]  P Cv Div Ch St Triage           [facility], Needs phone call on Monday. CHF dc [facility] 10/04/14        Appt- 10/11/14 with Luisa Dago, PA. Alvis Lemmings, RN, BSN

## 2014-10-04 NOTE — Progress Notes (Signed)
10/04/2014 1:48 PM Discharge AVS meds taken today and those due this evening reviewed.  Follow-up appointments and when to call md reviewed.  D/C IV and TELE.  Questions and concerns addressed.   D/C home per orders. Carney [place]ers

## 2014-10-05 NOTE — Telephone Encounter (Signed)
TCM - Patient contacted regarding discharge from hospital on October 04, 2014.  Patient understands to follow up with [facility] on 10/08/14 at 10:30am and [facility] on 10/11/14 at 9:00am. Patient understands discharge instructions.  Patient understands medications and has began the dose pack. She still needs to pick up a refill for Lipitor.  Patient understands to bring all medications to this visit. Patient states she is "feeling ok" and "doing a little better". She has no questions or concerns at this time.

## 2014-10-08 ENCOUNTER — Encounter: Payer: Self-pay | Admitting: Family Medicine

## 2014-10-08 ENCOUNTER — Ambulatory Visit: Payer: Self-pay | Attending: Family Medicine | Admitting: Family Medicine

## 2014-10-08 VITALS — BP 149/69 | HR 59 | Temp 98.0°F | Resp 17 | Ht 68.0 in | Wt 284.0 lb

## 2014-10-08 DIAGNOSIS — N179 Acute kidney failure, unspecified: Secondary | ICD-10-CM

## 2014-10-08 DIAGNOSIS — E1165 Type 2 diabetes mellitus with hyperglycemia: Secondary | ICD-10-CM

## 2014-10-08 DIAGNOSIS — E669 Obesity, unspecified: Secondary | ICD-10-CM

## 2014-10-08 DIAGNOSIS — I10 Essential (primary) hypertension: Secondary | ICD-10-CM | POA: Insufficient documentation

## 2014-10-08 DIAGNOSIS — R635 Abnormal weight gain: Secondary | ICD-10-CM | POA: Insufficient documentation

## 2014-10-08 DIAGNOSIS — N184 Chronic kidney disease, stage 4 (severe): Secondary | ICD-10-CM | POA: Insufficient documentation

## 2014-10-08 DIAGNOSIS — R0602 Shortness of breath: Secondary | ICD-10-CM

## 2014-10-08 DIAGNOSIS — Z6841 Body Mass Index (BMI) 40.0 and over, adult: Secondary | ICD-10-CM | POA: Insufficient documentation

## 2014-10-08 DIAGNOSIS — J189 Pneumonia, unspecified organism: Secondary | ICD-10-CM

## 2014-10-08 LAB — POCT GLYCOSYLATED HEMOGLOBIN (HGB A1C): Hemoglobin A1C: 9.7

## 2014-10-08 LAB — GLUCOSE, POCT (MANUAL RESULT ENTRY)
POC Glucose: 538 mg/dl — AB (ref 70–99)
POC Glucose: 565 mg/dl — AB (ref 70–99)

## 2014-10-08 MED ORDER — INSULIN ASPART 100 UNIT/ML ~~LOC~~ SOLN
15.0000 [IU] | Freq: Once | SUBCUTANEOUS | Status: AC
  Administered 2014-10-08: 15 [IU] via SUBCUTANEOUS

## 2014-10-08 MED ORDER — ATORVASTATIN CALCIUM 20 MG PO TABS: 20.0000 mg | ORAL_TABLET | Freq: Every day | ORAL | Status: DC

## 2014-10-08 MED ORDER — CARVEDILOL 25 MG PO TABS: 25.0000 mg | ORAL_TABLET | Freq: Two times a day (BID) | ORAL | Status: DC

## 2014-10-08 MED ORDER — ASPIRIN 325 MG PO TABS: 325.0000 mg | ORAL_TABLET | Freq: Every day | ORAL | Status: DC

## 2014-10-08 MED ORDER — LISINOPRIL 40 MG PO TABS: 40.0000 mg | ORAL_TABLET | Freq: Every day | ORAL | Status: DC

## 2014-10-08 MED ORDER — AMLODIPINE BESYLATE 10 MG PO TABS: 10.0000 mg | ORAL_TABLET | Freq: Every day | ORAL | Status: DC

## 2014-10-08 MED ORDER — VALACYCLOVIR HCL 500 MG PO TABS: 500.0000 mg | ORAL_TABLET | Freq: Every day | ORAL | Status: DC | PRN

## 2014-10-08 MED ORDER — INSULIN ASPART 100 UNIT/ML ~~LOC~~ SOLN: 10.0000 [IU] | Freq: Three times a day (TID) | SUBCUTANEOUS | Status: DC

## 2014-10-08 MED ORDER — INSULIN GLARGINE 300 UNIT/ML ~~LOC~~ SOPN: 50.0000 [IU] | PEN_INJECTOR | Freq: Every day | SUBCUTANEOUS | Status: DC

## 2014-10-08 NOTE — Progress Notes (Signed)
Patient states she is still SOB, tired.  She would like to know if she should restart her metformin  Recommendations for Outpatient Follow-up:  Patient will be discharged to home. She will need to follow up with the [facility] clinic in one week. She will need a repeat BMP at that time. She will need to continue her medications as scheduled. Patient should follow a heart healthy diet/carb modified.  Discharge Diagnoses:  Chest pain  Acute on chronic diastolic heart failure Acute respiratory failure with hypoxia Community acquired pneumonia Uncontrolled hypertension Normocytic anemia Diabetes mellitus, type II Acute kidney injury

## 2014-10-08 NOTE — Patient Instructions (Signed)
Diabetes Mellitus and Food It is important for you to manage your blood sugar (glucose) level. Your blood glucose level can be greatly affected by what you eat. Eating healthier foods in the appropriate amounts throughout the day at about the same time each day will help you control your blood glucose level. It can also help slow or prevent worsening of your diabetes mellitus. Healthy eating may even help you improve the level of your blood pressure and reach or maintain a healthy weight.  HOW CAN FOOD AFFECT ME? Carbohydrates Carbohydrates affect your blood glucose level more than any other type of food. Your dietitian will help you determine how many carbohydrates to eat at each meal and teach you how to count carbohydrates. Counting carbohydrates is important to keep your blood glucose at a healthy level, especially if you are using insulin or taking certain medicines for diabetes mellitus. Alcohol Alcohol can cause sudden decreases in blood glucose (hypoglycemia), especially if you use insulin or take certain medicines for diabetes mellitus. Hypoglycemia can be a life-threatening condition. Symptoms of hypoglycemia (sleepiness, dizziness, and disorientation) are similar to symptoms of having too much alcohol.  If your health care provider has given you approval to drink alcohol, do so in moderation and use the following guidelines:  Women should not have more than one drink per day, and men should not have more than two drinks per day. One drink is equal to:  12 oz of beer.  5 oz of wine.  1 oz of hard liquor.  Do not drink on an empty stomach.  Keep yourself hydrated. Have water, diet soda, or unsweetened iced tea.  Regular soda, juice, and other mixers might contain a lot of carbohydrates and should be counted. WHAT FOODS ARE NOT RECOMMENDED? As you make food choices, it is important to remember that all foods are not the same. Some foods have fewer nutrients per serving than other  foods, even though they might have the same number of calories or carbohydrates. It is difficult to get your body what it needs when you eat foods with fewer nutrients. Examples of foods that you should avoid that are high in calories and carbohydrates but low in nutrients include:  Trans fats (most processed foods list trans fats on the Nutrition Facts label).  Regular soda.  Juice.  Candy.  Sweets, such as cake, pie, doughnuts, and cookies.  Fried foods. WHAT FOODS CAN I EAT? Have nutrient-rich foods, which will nourish your body and keep you healthy. The food you should eat also will depend on several factors, including:  The calories you need.  The medicines you take.  Your weight.  Your blood glucose level.  Your blood pressure level.  Your cholesterol level. You also should eat a variety of foods, including:  Protein, such as meat, poultry, fish, tofu, nuts, and seeds (lean animal proteins are best).  Fruits.  Vegetables.  Dairy products, such as milk, cheese, and yogurt (low fat is best).  Breads, grains, pasta, cereal, rice, and beans.  Fats such as olive oil, trans fat-free margarine, canola oil, avocado, and olives. DOES EVERYONE WITH DIABETES MELLITUS HAVE THE SAME MEAL PLAN? Because every person with diabetes mellitus is different, there is not one meal plan that works for everyone. It is very important that you meet with a dietitian who will help you create a meal plan that is just right for you. Document Released: 03/26/2005 Document Revised: 07/04/2013 Document Reviewed: 05/26/2013 Exit[employer] Patient Information 2015 [employer]. This   information is not intended to replace advice given to you by your health care provider. Make sure you discuss any questions you have with your health care provider.  

## 2014-10-08 NOTE — Progress Notes (Signed)
Subjective:    Patient ID: [name], female    DOB: 24-Nov-1955, 59 y.o.   MRN: [id number]  HPI  Patient was admitted 10/02/2014 to 10/04/2014 after she had presented to the ED with chest pains shortness of breath, difficulty ambulating and fatigue for which she had called EMS to her home. On presentation to the ED she had an elevated blood pressure of 192/79, an EKG done which was unremarkable except for prolonged QTC of 491 ms no ST changes. She was ruled out for acute coronary syndrome with negative troponins. She had a negative d-dimer and negative lower extremity Dopplers. BNP was mildly elevated at 122, chest x-Ogborn revealed mild basilar infiltrate or atelectasis. She was placed on ceftriaxone and azithromycin which was later changed to Levaquin on discharge as well as a Medrol Dosepak. 2-D echo done showed EF of 65-70% with normal wall motion and grade 1 diastolic dysfunction. She was seen by the cardiology team and received some IV Lasix which was transitioned to oral.  Today she complains of  shortness of breath. She does admit some recent weight gain and is wondering if she could get weight loss pills because exercise is not working. She is currently on Levaquin which she was prescribed for the community-acquired pneumonia.  CBG in the clinic is 538, she states she just had breakfast about 30 minutes ago and did not take any short acting insulin. She is usually supposed to take 10 units of NovoLog with breakfast and lunch and dinner.  Her [facility] patient hasn't been seen in a while.    Review of Systems General: negative for fever, weight loss, appetite change Eyes: no visual symptoms. ENT: no ear symptoms, no sinus tenderness, no nasal congestion or sore throat. Neck: no pain  Respiratory: no wheezing, admits to shortness of breath, cough Cardiovascular: no chest pain, has dyspnea on exertion, admits to pedal edema, no orthopnea. Gastrointestinal:  no abdominal pain, no diarrhea, no constipation Genito-Urinary: no urinary frequency, no dysuria, no polyuria. Hematologic: no bruising Endocrine: no cold or heat intolerance Neurological: no headaches, no seizures, no tremors Musculoskeletal: no joint pains, no joint swelling Skin: no pruritus, no rash. Psychological: no depression, no anxiety,      Objective:   Physical Exam  Constitutional: Obese, not in any distress,  Eyes: PERRLA HENT: Head is atraumatic, normal sinuses, normal oropharynx, normal appearing tonsils and palate Neck: normal range of motion, no thyromegaly, no JVD cardiovascular: normal rate and rhythm, normal heart sounds, no murmurs, rub or gallop, + pedal edema Respiratory: clear to auscultation bilaterally, no wheezes, no rales, no rhonchi Abdomen: soft, not tender to palpation, normal bowel sounds, no enlarged organs Extremities: Full ROM, no tenderness in joints Skin: warm and dry, no lesions. Neurological: alert, oriented x3, cranial nerves I-XII grossly intact Psychological: normal mood.       Assessment & Plan:  59 year old female patient presenting for follow-up from hospitalization for community-acquired pneumonia who is currently on Levaquin but continues to have some shortness of breath. Also noted to be hyperglycemic due to the fact that she has been off her metformin due to acute kidney injury during admission.  Community-acquired pneumonia: Shortness of breath could be explained by resolving pneumonia, her lungs sound clear on exam and so I have advised her to complete her course of Levaquin and her oxygen saturation at this time is normal. Recent weight gain could also account for her shortness of breath.  Diabetes mellitus: Uncontrolled  with hba1c of 9.7. NovoLog 15 units given in the clinic, current elevation is expected given that she had had breakfast a few minutes ago and did not cover her sugars with a short acting insulin, repeat blood  sugar after 45 minutes Would need patient to give a urine specimen for UA but she is unable to urinate. I have increased her Lantus from 40-50 units to compensate for the absence of insulin.  Acute kidney injury: I will place metformin on hold for now until her most recent renal function really returns because at this time the last creatinine she has is 1.4 which is a contraindication to the use of metformin.  Essential hypertension:  Controlled on current medications  Obesity: I have encouraged increasing physical activity think cutting portion sizes Victoza might be an option in the near future that this could help curb her appetite.

## 2014-10-09 ENCOUNTER — Other Ambulatory Visit: Payer: Self-pay | Admitting: Family Medicine

## 2014-10-09 DIAGNOSIS — E119 Type 2 diabetes mellitus without complications: Secondary | ICD-10-CM

## 2014-10-09 LAB — CULTURE, BLOOD (ROUTINE X 2)
Culture: NO GROWTH
Culture: NO GROWTH

## 2014-10-09 MED ORDER — INSULIN GLARGINE 100 UNIT/ML SOLOSTAR PEN: 50.0000 [IU] | PEN_INJECTOR | Freq: Every day | SUBCUTANEOUS | Status: DC

## 2014-10-11 ENCOUNTER — Ambulatory Visit (INDEPENDENT_AMBULATORY_CARE_PROVIDER_SITE_OTHER): Payer: Self-pay | Admitting: Physician Assistant

## 2014-10-11 ENCOUNTER — Encounter: Payer: Self-pay | Admitting: Physician Assistant

## 2014-10-11 VITALS — BP 148/72 | HR 59 | Ht 68.0 in | Wt 281.0 lb

## 2014-10-11 DIAGNOSIS — E669 Obesity, unspecified: Secondary | ICD-10-CM

## 2014-10-11 DIAGNOSIS — N189 Chronic kidney disease, unspecified: Secondary | ICD-10-CM

## 2014-10-11 DIAGNOSIS — I5033 Acute on chronic diastolic (congestive) heart failure: Secondary | ICD-10-CM

## 2014-10-11 DIAGNOSIS — N179 Acute kidney failure, unspecified: Secondary | ICD-10-CM

## 2014-10-11 LAB — BASIC METABOLIC PANEL
BUN: 38 mg/dL — ABNORMAL HIGH (ref 6–23)
CO2: 26 mEq/L (ref 19–32)
Calcium: 8.4 mg/dL (ref 8.4–10.5)
Chloride: 104 mEq/L (ref 96–112)
Creatinine, Ser: 1.26 mg/dL — ABNORMAL HIGH (ref 0.40–1.20)
GFR: 56.38 mL/min — ABNORMAL LOW (ref >60.00)
Glucose, Bld: 477 mg/dL — ABNORMAL HIGH (ref 70–99)
Potassium: 4.8 mEq/L (ref 3.5–5.1)
Sodium: 133 mEq/L — ABNORMAL LOW (ref 135–145)

## 2014-10-11 MED ORDER — POTASSIUM CHLORIDE ER 10 MEQ PO TBCR: 10.0000 meq | EXTENDED_RELEASE_TABLET | Freq: Every day | ORAL | Status: DC

## 2014-10-11 MED ORDER — FUROSEMIDE 20 MG PO TABS: 20.0000 mg | ORAL_TABLET | Freq: Every day | ORAL | Status: DC

## 2014-10-11 MED ORDER — HYDRALAZINE HCL 10 MG PO TABS: 10.0000 mg | ORAL_TABLET | Freq: Two times a day (BID) | ORAL | Status: DC

## 2014-10-11 NOTE — Progress Notes (Signed)
Patient ID: [name], female   DOB: 02-01-1956, 59 y.o.   MRN: [id number]    Date:  10/11/2014   ID:  ASMI KURTH, DOB 11/12/1955, MRN [id number]  PCP:  Harvie Junior, MD  Primary Cardiologist:  Irish Lack, new   Chief Complaint  Patient presents with  . [facility]  . Chest Pain     History of Present Illness: [name] is a 59 y.o. female  85 -year-old woman who previously lived in [place]. She states that around 2011, she had an episode of congestive heart failure at that time. She is unaware whether she had a weak heart or strong heart. She does not recall having a coronary angiogram. She was managed with medications. She is certain that she did not have any kind of angioplasty or stent. She moved to [place] a few years after that. She was seeing a doctor regularly there who is treating her for hypertension. She was on the lisinopril, carvedilol and amlodipine for her blood pressure. She states that she has been taking these medicines regularly.  She moved to [place] more recently. She has family here. She states she sees them every day. She has seen a practitioner at the office of Dr. Cynda Familia.  Patient was admitted from March 22 of 10/04/2014 with acute on chronic diastolic heart failure, chest pain, community-acquired pneumonia and uncontrolled hypertension.  She ruled out for MI. Echocardiogram showed ejection fraction 65-70% with grade 1 diastolic dysfunction. She has negative d-dimer. She was treated with IV Lasix and diuresed.  He was discharged on Levaquin. Her hypertension was treated with Coreg, lisinopril, amlodipine and was discharged on when necessary Lasix.  Patient presents today for posthospital follow-up.  She reports that the swelling in her legs has gotten worse since discharge. She says she was not discharged on when necessary Lasix however that was in our note. She sleeps on 2 pillows because her breathing is better. She reports some shortness of breath  with activity.  She also reports snoring and has been told that she stops breathing when she sleeping.  The patient currently denies nausea, vomiting, fever, chest pain,  dizziness, PND, cough, congestion, abdominal pain, hematochezia, melena,  claudication.  Wt Readings from Last 3 Encounters:  10/11/14 281 lb (127.461 kg)  10/08/14 284 lb (128.822 kg)  10/02/14 281 lb 12 oz (127.8 kg)     Past Medical History  Diagnosis Date  . CHF (congestive heart failure)   . Bronchitis   . Diabetes mellitus   . Hypertension   . Ulcer   . Hepatitis C   . Pancreatitis   . Complication of anesthesia   . Refusal of blood transfusions as patient is Jehovah's Witness   . Asthma     Current Outpatient Prescriptions  Medication Sig Dispense Refill  . albuterol (PROAIR HFA) 108 (90 BASE) MCG/ACT inhaler Inhale 2 puffs into the lungs every 4 (four) hours as needed for wheezing.     [name] amLODipine (NORVASC) 10 MG tablet Take 1 tablet (10 mg total) by mouth daily. 30 tablet 3  . aspirin 325 MG tablet Take 1 tablet (325 mg total) by mouth daily. 30 tablet 3  . atorvastatin (LIPITOR) 20 MG tablet Take 1 tablet (20 mg total) by mouth daily. 30 tablet 3  . carvedilol (COREG) 25 MG tablet Take 1 tablet (25 mg total) by mouth 2 (two) times daily with a meal. 60 tablet 3  . GABAPENTIN PO Take 1 tablet by mouth  at bedtime.    . insulin aspart (NOVOLOG) 100 UNIT/ML injection Inject 10 Units into the skin 3 (three) times daily before meals. 10 mL 3  . Insulin Glargine (LANTUS SOLOSTAR) 100 UNIT/ML Solostar Pen Inject 50 Units into the skin daily at 10 pm. 5 pen 3  . lisinopril (PRINIVIL,ZESTRIL) 40 MG tablet Take 1 tablet (40 mg total) by mouth daily. 30 tablet 3  . metFORMIN (GLUCOPHAGE) 1000 MG tablet Take 1,000 mg by mouth 2 (two) times daily with a meal.    . oxyCODONE-acetaminophen (PERCOCET/ROXICET) 5-325 MG per tablet Take 1-2 tablets by mouth every 4 (four) hours as needed for severe pain. 15 tablet 0  .  ranitidine (ZANTAC) 150 MG tablet Take 150 mg by mouth 2 (two) times daily.    Nelva Nay SOLOSTAR 300 UNIT/ML SOPN Inject 40 Units as directed daily.   3  . valACYclovir (VALTREX) 500 MG tablet Take 1 tablet (500 mg total) by mouth daily as needed (outbreak). 30 tablet 2  . furosemide (LASIX) 20 MG tablet Take 1 tablet (20 mg total) by mouth daily. 30 tablet 5  . hydrALAZINE (APRESOLINE) 10 MG tablet Take 1 tablet (10 mg total) by mouth 2 (two) times daily. 30 tablet 5  . potassium chloride (K-DUR) 10 MEQ tablet Take 1 tablet (10 mEq total) by mouth daily. 30 tablet 5   No current facility-administered medications for this visit.    Allergies:    Allergies  Allergen Reactions  . Morphine And Related     itching  . Shellfish Allergy     swelling    Social History:  The patient  reports that she has quit smoking. She has never used smokeless tobacco. She reports that she drinks alcohol. She reports that she does not use illicit drugs.   Family history:   Family History  Problem Relation Age of Onset  . Colon cancer Mother   . Heart attack Other   . Heart attack Maternal Grandmother   . Hypertension Sister   . Hypertension Brother   . Diabetes Paternal Grandmother     ROS:  Please see the history of present illness.  All other systems reviewed and negative.   PHYSICAL EXAM: VS:  BP 148/72 mmHg  Pulse 59  Ht 5\' 8"  (1.727 m)  Wt 281 lb (127.461 kg)  BMI 42.74 kg/m2 Obese, well developed, in no acute distress HEENT: Pupils are equal round react to light accommodation extraocular movements are intact.  Neck: no JVDNo cervical lymphadenopathy. Cardiac: Regular rate and rhythm without murmurs rubs or gallops. Lungs:  clear to auscultation bilaterally, no wheezing, rhonchi or rales Abd: soft, nontender, positive bowel sounds all quadrants, no hepatosplenomegaly Ext: 1+ lower extremity edema.  2+ radial and dorsalis pedis pulses. Skin: warm and dry Neuro:  Grossly normal  EKG:   His bradycardia rate 59 bpm   ASSESSMENT AND PLAN:  Problem List Items Addressed This Visit    Obesity    We discussed low carb diet and exercise.  She also reports that she snores when she sleeping and also at times has stopped breathing.  We'll schedule for a sleep study.      Relevant Medications   TOUJEO SOLOSTAR 300 UNIT/ML SOPN   Acute on chronic diastolic congestive heart failure, NYHA class 2 - Primary    She does have some lower extremity edema and according to the patient it's gotten worse since her discharge. She was not discharged on any Lasix. Will prescribe 20 mg  when necessary daily along with 10 MG of potassium to be taken with it.  If after taking 3 days' worth of Lasix her lower extremity does edema does not improve, she will call the office for instructions.  Basic metabolic panel today.  Discussed a low-sodium diet daily weight monitoring.      Relevant Medications   hydrALAZINE (APRESOLINE) tablet   furosemide (LASIX) tablet   Other Relevant Orders   EKG 12-Lead   Split night study   Acute kidney injury    Check a basic metabolic panel today       Other Visit Diagnoses    Acute-on-chronic kidney injury        Relevant Orders    Basic Metabolic Panel (BMET)         Hypertension:. She is maxed out on amlodipine, lisinopril, Coreg. Will add hydralazine 10 mg twice a day.

## 2014-10-11 NOTE — Assessment & Plan Note (Addendum)
We discussed low carb diet and exercise.  She also reports that she snores when she sleeping and also at times has stopped breathing.  We'll schedule for a sleep study.

## 2014-10-11 NOTE — Assessment & Plan Note (Signed)
Blood pressure is mildly elevated. Her amlodipine, coreg(HR 59) and lisinopril are maxed out. Going to add hydralazine 10 mg grams twice a day.

## 2014-10-11 NOTE — Assessment & Plan Note (Addendum)
She does have some lower extremity edema and according to the patient it's gotten worse since her discharge. She was not discharged on any Lasix. Will prescribe 20 mg when necessary daily along with 10 MG of potassium to be taken with it.  If after taking 3 days' worth of Lasix her lower extremity does edema does not improve, she will call the office for instructions.  Basic metabolic panel today.  Discussed a low-sodium diet daily weight monitoring.

## 2014-10-11 NOTE — Assessment & Plan Note (Signed)
Check a basic metabolic panel today

## 2014-10-11 NOTE — Patient Instructions (Addendum)
Monitor your weight every morning.  If you gain 3 pounds in 24 hours, or 5 pounds in a week, take the lasix and potassium for three days.  If your weight does not come down, cal the office for instructions.   START TAKING LASIX 20 MG AS NEEDED  START TAKING POTASSIUM 10 MEQ AS NEEDED WITH THE LASIX  START TAKING HYDRALAZINE 10 MG TWICE A DAY    LABS TODAY BMET    FOLLOW UP WITH DR Irish Lack IN 3 TO 4 MONTHS    PATIENT NEEDS TO BE SET UP FOR SLEEP STUDY

## 2014-10-16 ENCOUNTER — Telehealth: Payer: Self-pay

## 2014-10-16 NOTE — Telephone Encounter (Signed)
Pt c/o of swelling in feet has gotten worse and still having SOB with activity.  Pt stated that swelling does not get better at night or anytime when feet are elevated. Pt stated that feet and ankles are "hard/tight" with fluid starting to come up her legs. Pt does not have pain of legs just occasional cramping when trying to lift legs.  Pt has not taken the addition 20 mg of Lasix as recommended as she does not have a scale at current place, but will go get from other house to start weighing herself. Pt stated that cloths are fitting tighter.  Pt reminded of weight increase of 3 lbs in 24 hours and/or 5 lb increase in one weeks she needs to call us. Pt stated she has been taking all other medication as recommended. Forward to Tarri Fuller, [place] for review.

## 2014-10-21 ENCOUNTER — Encounter (HOSPITAL_COMMUNITY): Payer: Self-pay | Admitting: *Deleted

## 2014-10-21 ENCOUNTER — Emergency Department (HOSPITAL_COMMUNITY)
Admission: EM | Admit: 2014-10-21 | Discharge: 2014-10-21 | Disposition: A | Discharge status: Home / Self Care | Payer: Self-pay | Attending: Emergency Medicine | Admitting: Emergency Medicine

## 2014-10-21 DIAGNOSIS — I10 Essential (primary) hypertension: Secondary | ICD-10-CM | POA: Insufficient documentation

## 2014-10-21 DIAGNOSIS — Z8619 Personal history of other infectious and parasitic diseases: Secondary | ICD-10-CM | POA: Insufficient documentation

## 2014-10-21 DIAGNOSIS — E119 Type 2 diabetes mellitus without complications: Secondary | ICD-10-CM | POA: Insufficient documentation

## 2014-10-21 DIAGNOSIS — M65331 Trigger finger, right middle finger: Secondary | ICD-10-CM | POA: Insufficient documentation

## 2014-10-21 DIAGNOSIS — R202 Paresthesia of skin: Secondary | ICD-10-CM | POA: Insufficient documentation

## 2014-10-21 DIAGNOSIS — Z8719 Personal history of other diseases of the digestive system: Secondary | ICD-10-CM | POA: Insufficient documentation

## 2014-10-21 DIAGNOSIS — E8809 Other disorders of plasma-protein metabolism, not elsewhere classified: Secondary | ICD-10-CM | POA: Insufficient documentation

## 2014-10-21 DIAGNOSIS — Z87891 Personal history of nicotine dependence: Secondary | ICD-10-CM | POA: Insufficient documentation

## 2014-10-21 DIAGNOSIS — R2232 Localized swelling, mass and lump, left upper limb: Secondary | ICD-10-CM | POA: Insufficient documentation

## 2014-10-21 DIAGNOSIS — I509 Heart failure, unspecified: Secondary | ICD-10-CM | POA: Insufficient documentation

## 2014-10-21 DIAGNOSIS — D649 Anemia, unspecified: Secondary | ICD-10-CM | POA: Insufficient documentation

## 2014-10-21 DIAGNOSIS — Z7982 Long term (current) use of aspirin: Secondary | ICD-10-CM | POA: Insufficient documentation

## 2014-10-21 DIAGNOSIS — Z794 Long term (current) use of insulin: Secondary | ICD-10-CM | POA: Insufficient documentation

## 2014-10-21 DIAGNOSIS — E669 Obesity, unspecified: Secondary | ICD-10-CM

## 2014-10-21 DIAGNOSIS — J45909 Unspecified asthma, uncomplicated: Secondary | ICD-10-CM | POA: Insufficient documentation

## 2014-10-21 DIAGNOSIS — M7989 Other specified soft tissue disorders: Secondary | ICD-10-CM

## 2014-10-21 DIAGNOSIS — Z79899 Other long term (current) drug therapy: Secondary | ICD-10-CM | POA: Insufficient documentation

## 2014-10-21 LAB — CBC
HCT: 29 % — ABNORMAL LOW (ref 36.0–46.0)
Hemoglobin: 9.4 g/dL — ABNORMAL LOW (ref 12.0–15.0)
MCH: 26.9 pg (ref 26.0–34.0)
MCHC: 32.4 g/dL (ref 30.0–36.0)
MCV: 82.9 fL (ref 78.0–100.0)
Platelets: 210 10*3/uL (ref 150–400)
RBC: 3.5 MIL/uL — ABNORMAL LOW (ref 3.87–5.11)
RDW: 15.5 % (ref 11.5–15.5)
WBC: 6.9 10*3/uL (ref 4.0–10.5)

## 2014-10-21 LAB — COMPREHENSIVE METABOLIC PANEL
ALT: 21 U/L (ref 0–35)
AST: 20 U/L (ref 0–37)
Albumin: 2.7 g/dL — ABNORMAL LOW (ref 3.5–5.2)
Alkaline Phosphatase: 138 U/L — ABNORMAL HIGH (ref 39–117)
Anion gap: 5 (ref 5–15)
BUN: 34 mg/dL — ABNORMAL HIGH (ref 6–23)
CO2: 26 mmol/L (ref 19–32)
Calcium: 7.8 mg/dL — ABNORMAL LOW (ref 8.4–10.5)
Chloride: 105 mmol/L (ref 96–112)
Creatinine, Ser: 1.46 mg/dL — ABNORMAL HIGH (ref 0.50–1.10)
GFR calc Af Amer: 45 mL/min — ABNORMAL LOW (ref >90)
GFR calc non Af Amer: 39 mL/min — ABNORMAL LOW (ref >90)
Glucose, Bld: 267 mg/dL — ABNORMAL HIGH (ref 70–99)
Potassium: 4 mmol/L (ref 3.5–5.1)
Sodium: 136 mmol/L (ref 135–145)
Total Bilirubin: 0.4 mg/dL (ref 0.3–1.2)
Total Protein: 5.9 g/dL — ABNORMAL LOW (ref 6.0–8.3)

## 2014-10-21 LAB — TROPONIN I: Troponin I: <0.03 ng/mL (ref <0.031)

## 2014-10-21 NOTE — ED Notes (Signed)
The pt ghas had numbness in her lt arm from the elbow down to her lt hand.,  She woke up at 1000 this am with it.  Good radial pulse  Arm temp same as the rt one.  No known injury  No previous history.  Last pm her rt middle finger flexed and she could not straighten it out until 45 minutes she could not straighten it out.  It has been intermittent

## 2014-10-21 NOTE — Discharge Instructions (Signed)
It was our pleasure to provide your ER care today - we hope that you feel better.  For recent recurrent symptoms of right middle finger, you may have a trigger finger issue - follow up with your doctor this week - discuss possible referral to hand specialist if symptoms recur/persist.  For left forearm swelling/numbness sensation, elevate arm, avoid lying on arm.  Follow up with your primary care doctor tomorrow for recheck.  From today's labs, you are noted to have chronic anemia (low blood count, with hemoglobin mildly lower than previous at 9.4), elevated blood sugars, low albumin level (low protein in blood), and chronic kidney insufficiency that appears stable as compared to prior test results - follow up with your primary care doctor this week.  Contact your doctors office tomorrow morning to arrange the close follow up with them.   Return to ER if worse, new symptoms, change in speech or vision, one-sided loss of sensation and/or weakness, fevers, chest pain, trouble breathing, other concern.        Paresthesia Paresthesia is an abnormal burning or prickling sensation. This sensation is generally felt in the hands, arms, legs, or feet. However, it may occur in any part of the body. It is usually not painful. The feeling may be described as:  Tingling or numbness.  "Pins and needles."  Skin crawling.  Buzzing.  Limbs "falling asleep."  Itching. Most people experience temporary (transient) paresthesia at some time in their lives. CAUSES  Paresthesia may occur when you breathe too quickly (hyperventilation). It can also occur without any apparent cause. Commonly, paresthesia occurs when pressure is placed on a nerve. The feeling quickly goes away once the pressure is removed. For some people, however, paresthesia is a long-lasting (chronic) condition caused by an underlying disorder. The underlying disorder may be:  A traumatic, direct injury to nerves. Examples include  a:  Broken (fractured) neck.  Fractured skull.  A disorder affecting the brain and spinal cord (central nervous system). Examples include:  Transverse myelitis.  Encephalitis.  Transient ischemic attack.  Multiple sclerosis.  Stroke.  Tumor or blood vessel problems, such as an arteriovenous malformation pressing against the brain or spinal cord.  A condition that damages the peripheral nerves (peripheral neuropathy). Peripheral nerves are not part of the brain and spinal cord. These conditions include:  Diabetes.  Peripheral vascular disease.  Nerve entrapment syndromes, such as carpal tunnel syndrome.  Shingles.  Hypothyroidism.  Vitamin B12 deficiencies.  Alcoholism.  Heavy metal poisoning (lead, arsenic).  Rheumatoid arthritis.  Systemic lupus erythematosus. DIAGNOSIS  Your caregiver will attempt to find the underlying cause of your paresthesia. Your caregiver may:  Take your medical history.  Perform a physical exam.  Order various lab tests.  Order imaging tests. TREATMENT  Treatment for paresthesia depends on the underlying cause. HOME CARE INSTRUCTIONS  Avoid drinking alcohol.  You may consider massage or acupuncture to help relieve your symptoms.  Keep all follow-up appointments as directed by your caregiver. SEEK IMMEDIATE MEDICAL CARE IF:   You feel weak.  You have trouble walking or moving.  You have problems with speech or vision.  You feel confused.  You cannot control your bladder or bowel movements.  You feel numbness after an injury.  You faint.  Your burning or prickling feeling gets worse when walking.  You have pain, cramps, or dizziness.  You develop a rash. MAKE SURE YOU:  Understand these instructions.  Will watch your condition.  Will get help right away if  you are not doing well or get worse. Document Released: 06/19/2002 Document Revised: 09/21/2011 Document Reviewed: 03/20/2011 [facility] Patient  Information 2015 [place], [place]. This information is not intended to replace advice given to you by your health care provider. Make sure you discuss any questions you have with your health care provider.   Trigger Finger Trigger finger (digital tendinitis and stenosing tenosynovitis) is a common disorder that causes an often painful catching of the fingers or thumb. It occurs as a clicking, snapping, or locking of a finger in the palm of the hand. This is caused by a problem with the tendons that flex or bend the fingers sliding smoothly through their sheaths. The condition may occur in any finger or a couple fingers at the same time.  The finger may lock with the finger curled or suddenly straighten out with a snap. This is more common in patients with rheumatoid arthritis and diabetes. Left untreated, the condition may get worse to the point where the finger becomes locked in flexion, like making a fist, or less commonly locked with the finger straightened out. CAUSES   Inflammation and scarring that lead to swelling around the tendon sheath.  Repeated or forceful movements.  Rheumatoid arthritis, an autoimmune disease that affects joints.  Gout.  Diabetes mellitus. SIGNS AND SYMPTOMS  Soreness and swelling of your finger.  A painful clicking or snapping as you bend and straighten your finger. DIAGNOSIS  Your health care provider will do a physical exam of your finger to diagnose trigger finger. TREATMENT   Splinting for 6-8 weeks may be helpful.  Nonsteroidal anti-inflammatory medicines (NSAIDs) can help to relieve the pain and inflammation.  Cortisone injections, along with splinting, may speed up recovery. Several injections may be required. Cortisone may give relief after one injection.  Surgery is another treatment that may be used if conservative treatments do not work. Surgery can be minor, without incisions (a cut does not have to be made), and can be done with a needle  through the skin.  Other surgical choices involve an open procedure in which the surgeon opens the hand through a small incision and cuts the pulley so the tendon can again slide smoothly. Your hand will still work fine. HOME CARE INSTRUCTIONS  Apply ice to the injured area, twice per day:  Put ice in a plastic bag.  Place a towel between your skin and the bag.  Leave the ice on for 20 minutes, 3-4 times a day.  Rest your hand often. MAKE SURE YOU:   Understand these instructions.  Will watch your condition.  Will get help right away if you are not doing well or get worse. Document Released: 04/18/2004 Document Revised: 03/01/2013 Document Reviewed: 11/29/2012 [facility] Patient Information 2015 [place], [place]. This information is not intended to replace advice given to you by your health care provider. Make sure you discuss any questions you have with your health care provider.     Anemia, Nonspecific Anemia is a condition in which the concentration of red blood cells or hemoglobin in the blood is below normal. Hemoglobin is a substance in red blood cells that carries oxygen to the tissues of the body. Anemia results in not enough oxygen reaching these tissues.  CAUSES  Common causes of anemia include:   Excessive bleeding. Bleeding may be internal or external. This includes excessive bleeding from periods (in women) or from the intestine.   Poor nutrition.   Chronic kidney, thyroid, and liver disease.  Bone marrow disorders  that decrease red blood cell production.  Cancer and treatments for cancer.  HIV, AIDS, and their treatments.  Spleen problems that increase red blood cell destruction.  Blood disorders.  Excess destruction of red blood cells due to infection, medicines, and autoimmune disorders. SIGNS AND SYMPTOMS   Minor weakness.   Dizziness.   Headache.  Palpitations.   Shortness of breath, especially with exercise.   Paleness.  Cold  sensitivity.  Indigestion.  Nausea.  Difficulty sleeping.  Difficulty concentrating. Symptoms may occur suddenly or they may develop slowly.  DIAGNOSIS  Additional blood tests are often needed. These help your health care provider determine the best treatment. Your health care provider will check your stool for blood and look for other causes of blood loss.  TREATMENT  Treatment varies depending on the cause of the anemia. Treatment can include:   Supplements of iron, vitamin 123456, or folic acid.   Hormone medicines.   A blood transfusion. This may be needed if blood loss is severe.   Hospitalization. This may be needed if there is significant continual blood loss.   Dietary changes.  Spleen removal. HOME CARE INSTRUCTIONS Keep all follow-up appointments. It often takes many weeks to correct anemia, and having your health care provider check on your condition and your response to treatment is very important. SEEK IMMEDIATE MEDICAL CARE IF:   You develop extreme weakness, shortness of breath, or chest pain.   You become dizzy or have trouble concentrating.  You develop heavy vaginal bleeding.   You develop a rash.   You have bloody or black, tarry stools.   You faint.   You vomit up blood.   You vomit repeatedly.   You have abdominal pain.  You have a fever or persistent symptoms for more than 2-3 days.   You have a fever and your symptoms suddenly get worse.   You are dehydrated.  MAKE SURE YOU:  Understand these instructions.  Will watch your condition.  Will get help right away if you are not doing well or get worse. Document Released: 08/06/2004 Document Revised: 03/01/2013 Document Reviewed: 12/23/2012 [facility] Patient Information 2015 [place], [place]. This information is not intended to replace advice given to you by your health care provider. Make sure you discuss any questions you have with your health care  provider.    Hyperglycemia Hyperglycemia occurs when the glucose (sugar) in your blood is too high. Hyperglycemia can happen for many reasons, but it most often happens to people who do not know they have diabetes or are not managing their diabetes properly.  CAUSES  Whether you have diabetes or not, there are other causes of hyperglycemia. Hyperglycemia can occur when you have diabetes, but it can also occur in other situations that you might not be as aware of, such as: Diabetes  If you have diabetes and are having problems controlling your blood glucose, hyperglycemia could occur because of some of the following reasons:  Not following your meal plan.  Not taking your diabetes medications or not taking it properly.  Exercising less or doing less activity than you normally do.  Being sick. Pre-diabetes  This cannot be ignored. Before people develop Type 2 diabetes, they almost always have "pre-diabetes." This is when your blood glucose levels are higher than normal, but not yet high enough to be diagnosed as diabetes. Research has shown that some long-term damage to the body, especially the heart and circulatory system, may already be occurring during pre-diabetes. If you take action  to manage your blood glucose when you have pre-diabetes, you may delay or prevent Type 2 diabetes from developing. Stress  If you have diabetes, you may be "diet" controlled or on oral medications or insulin to control your diabetes. However, you may find that your blood glucose is higher than usual in the hospital whether you have diabetes or not. This is often referred to as "stress hyperglycemia." Stress can elevate your blood glucose. This happens because of hormones put out by the body during times of stress. If stress has been the cause of your high blood glucose, it can be followed regularly by your caregiver. That way he/she can make sure your hyperglycemia does not continue to get worse or progress to  diabetes. Steroids  Steroids are medications that act on the infection fighting system (immune system) to block inflammation or infection. One side effect can be a rise in blood glucose. Most people can produce enough extra insulin to allow for this rise, but for those who cannot, steroids make blood glucose levels go even higher. It is not unusual for steroid treatments to "uncover" diabetes that is developing. It is not always possible to determine if the hyperglycemia will go away after the steroids are stopped. A special blood test called an A1c is sometimes done to determine if your blood glucose was elevated before the steroids were started. SYMPTOMS  Thirsty.  Frequent urination.  Dry mouth.  Blurred vision.  Tired or fatigue.  Weakness.  Sleepy.  Tingling in feet or leg. DIAGNOSIS  Diagnosis is made by monitoring blood glucose in one or all of the following ways:  A1c test. This is a chemical found in your blood.  Fingerstick blood glucose monitoring.  Laboratory results. TREATMENT  First, knowing the cause of the hyperglycemia is important before the hyperglycemia can be treated. Treatment may include, but is not be limited to:  Education.  Change or adjustment in medications.  Change or adjustment in meal plan.  Treatment for an illness, infection, etc.  More frequent blood glucose monitoring.  Change in exercise plan.  Decreasing or stopping steroids.  Lifestyle changes. HOME CARE INSTRUCTIONS   Test your blood glucose as directed.  Exercise regularly. Your caregiver will give you instructions about exercise. Pre-diabetes or diabetes which comes on with stress is helped by exercising.  Eat wholesome, balanced meals. Eat often and at regular, fixed times. Your caregiver or nutritionist will give you a meal plan to guide your sugar intake.  Being at an ideal weight is important. If needed, losing as little as 10 to 15 pounds may help improve blood  glucose levels. SEEK MEDICAL CARE IF:   You have questions about medicine, activity, or diet.  You continue to have symptoms (problems such as increased thirst, urination, or weight gain). SEEK IMMEDIATE MEDICAL CARE IF:   You are vomiting or have diarrhea.  Your breath smells fruity.  You are breathing faster or slower.  You are very sleepy or incoherent.  You have numbness, tingling, or pain in your feet or hands.  You have chest pain.  Your symptoms get worse even though you have been following your caregiver's orders.  If you have any other questions or concerns. Document Released: 12/23/2000 Document Revised: 09/21/2011 Document Reviewed: 10/26/2011 [facility] Patient Information 2015 [place], [place]. This information is not intended to replace advice given to you by your health care provider. Make sure you discuss any questions you have with your health care provider.

## 2014-10-21 NOTE — ED Provider Notes (Addendum)
CSN: [id number]     Arrival date & time 10/21/14  1719 History   First MD Initiated Contact with Patient 10/21/14 1755     Chief Complaint  Patient presents with  . Arm Swelling     (Consider location/radiation/quality/duration/timing/severity/associated sxs/prior Treatment) The history is provided by the patient.  pt c/o left arm swelling, diffusely, in past day. States symptoms persistent, constant. Denies hx same. Also states awoke this morning and left arm from elbow to fingers feels tingly.  Denies neck pain or radicular pain. Pt unaware of sleeping on arm or in awkward position last pm.  No weakness or loss of normal function of arm. No facial numbness or tingling. No leg numbness or weakness. Denies headache. Pt also states that the right middle finger has gotten 'locked' in flexed position 2 times in the past few days. Lasted several minutes, currently resolved. Denies hand or finger pain. No injury.  Pt states otherwise recent health at baseline. Compliant w normal meds. No fever or chills. Denies chest pain or discomfort.       Past Medical History  Diagnosis Date  . CHF (congestive heart failure)   . Bronchitis   . Diabetes mellitus   . Hypertension   . Ulcer   . Hepatitis C   . Pancreatitis   . Complication of anesthesia   . Refusal of blood transfusions as patient is Jehovah's Witness   . Asthma    Past Surgical History  Procedure Laterality Date  . Tubal ligation    . Cholecystectomy    . Knee arthroscopy    . Tubal ligation    . Eye surgery     Family History  Problem Relation Age of Onset  . Colon cancer Mother   . Heart attack Other   . Heart attack Maternal Grandmother   . Hypertension Sister   . Hypertension Brother   . Diabetes Paternal Grandmother    History  Substance Use Topics  . Smoking status: Former Research scientist (life sciences)  . Smokeless tobacco: Never Used     Comment: quit smoking in 1982  . Alcohol Use: Yes     Comment: ocassionally   OB History    No data available     Review of Systems  Constitutional: Negative for fever and chills.  HENT: Negative for sore throat.   Eyes: Negative for visual disturbance.  Respiratory: Negative for cough and shortness of breath.   Cardiovascular: Negative for chest pain, palpitations and leg swelling.  Gastrointestinal: Negative for vomiting, abdominal pain and diarrhea.  Endocrine: Negative for polyuria.  Genitourinary: Negative for dysuria and flank pain.  Musculoskeletal: Negative for back pain and neck pain.  Skin: Negative for rash.  Neurological: Negative for syncope, speech difficulty and headaches.  Hematological: Does not bruise/bleed easily.  Psychiatric/Behavioral: Negative for confusion.      Allergies  Morphine and related and Shellfish allergy  Home Medications   Prior to Admission medications   Medication Sig Start Date End Date Taking? Authorizing Provider  albuterol (PROAIR HFA) 108 (90 BASE) MCG/ACT inhaler Inhale 2 puffs into the lungs every 4 (four) hours as needed for wheezing.  02/07/13   Historical Provider, MD  amLODipine (NORVASC) 10 MG tablet Take 1 tablet (10 mg total) by mouth daily. 10/08/14   Arnoldo Morale, MD  aspirin 325 MG tablet Take 1 tablet (325 mg total) by mouth daily. 10/08/14   Arnoldo Morale, MD  atorvastatin (LIPITOR) 20 MG tablet Take 1 tablet (20 mg total) by mouth daily.  10/08/14   Arnoldo Morale, MD  carvedilol (COREG) 25 MG tablet Take 1 tablet (25 mg total) by mouth 2 (two) times daily with a meal. 10/08/14   Arnoldo Morale, MD  furosemide (LASIX) 20 MG tablet Take 1 tablet (20 mg total) by mouth daily. Patient taking differently: Take 20 mg by mouth as needed. TAKE WITH POTASSIUM 10 MEQ AS NEEDED 10/11/14   Brett Canales, PA-C  GABAPENTIN PO Take 1 tablet by mouth at bedtime.    Historical Provider, MD  hydrALAZINE (APRESOLINE) 10 MG tablet Take 1 tablet (10 mg total) by mouth 2 (two) times daily. 10/11/14   Brett Canales, PA-C  insulin aspart (NOVOLOG)  100 UNIT/ML injection Inject 10 Units into the skin 3 (three) times daily before meals. 10/08/14   Arnoldo Morale, MD  Insulin Glargine (LANTUS SOLOSTAR) 100 UNIT/ML Solostar Pen Inject 50 Units into the skin daily at 10 pm. 10/09/14   Arnoldo Morale, MD  lisinopril (PRINIVIL,ZESTRIL) 40 MG tablet Take 1 tablet (40 mg total) by mouth daily. 10/08/14   Arnoldo Morale, MD  metFORMIN (GLUCOPHAGE) 1000 MG tablet Take 1,000 mg by mouth 2 (two) times daily with a meal.    Historical Provider, MD  oxyCODONE-acetaminophen (PERCOCET/ROXICET) 5-325 MG per tablet Take 1-2 tablets by mouth every 4 (four) hours as needed for severe pain. 06/18/14   Charlann Lange, PA-C  potassium chloride (K-DUR) 10 MEQ tablet Take 1 tablet (10 mEq total) by mouth daily. Patient taking differently: Take 10 mEq by mouth as needed. TAKE WITH LASIX 20 MG AS NEEDED 10/11/14   Brett Canales, PA-C  ranitidine (ZANTAC) 150 MG tablet Take 150 mg by mouth 2 (two) times daily.    Historical Provider, MD  TOUJEO SOLOSTAR 300 UNIT/ML SOPN Inject 40 Units as directed daily.  09/12/14   Historical Provider, MD  valACYclovir (VALTREX) 500 MG tablet Take 1 tablet (500 mg total) by mouth daily as needed (outbreak). 10/08/14   Arnoldo Morale, MD   BP 136/62 mmHg  Pulse 62  Temp(Src) 98.9 F (37.2 C)  Resp 18  Ht 5\' 8"  (1.727 m)  Wt 285 lb (129.275 kg)  BMI 43.34 kg/m2  SpO2 98% Physical Exam  Constitutional: She is oriented to person, place, and time. She appears well-developed and well-nourished. No distress.  HENT:  Head: Atraumatic.  Mouth/Throat: Oropharynx is clear and moist.  No sinus or temporal tenderness.   Eyes: Conjunctivae and EOM are normal. Pupils are equal, round, and reactive to light. No scleral icterus.  Neck: Normal range of motion. Neck supple. No tracheal deviation present.  No bruit. Full rom without pain.  Cardiovascular: Normal rate, regular rhythm, normal heart sounds and intact distal pulses.  Exam reveals no gallop and no  friction rub.   No murmur heard. Pulmonary/Chest: Effort normal and breath sounds normal. No respiratory distress.  Abdominal: Soft. Normal appearance and bowel sounds are normal. She exhibits no distension. There is no tenderness.  Genitourinary:  No cva tenderness  Musculoskeletal: Normal range of motion. She exhibits no edema or tenderness.  No gross extremity edema noted. Radial pulse 2+ bil. No erythema or increased warmth to extremity. No skin changes, erythema, lesions or cellulitis. No epitroch or axillary l/a.  Full rom at left shoulder, elbow and wrist without pain. Right fingers/hand w normal rom, no focal pain or swelling noted.  c spine non tender, aligned, no step off, no focal midline tenderness or pain w rom.   Neurological: She is alert and  oriented to person, place, and time. No cranial nerve deficit.  Motor intact bil. stre 5/5. sens grossly intact. LUE: radial, median, ulnar nerve fxn both motor and sens intact.   Skin: Skin is warm and dry. No rash noted. She is not diaphoretic.  Psychiatric: She has a normal mood and affect.  Nursing note and vitals reviewed.   ED Course  Procedures (including critical care time) Labs Review   Results for orders placed or performed during the hospital encounter of 10/21/14  CBC  Result Value Ref Range   WBC 6.9 4.0 - 10.5 K/uL   RBC 3.50 (L) 3.87 - 5.11 MIL/uL   Hemoglobin 9.4 (L) 12.0 - 15.0 g/dL   HCT 29.0 (L) 36.0 - 46.0 %   MCV 82.9 78.0 - 100.0 fL   MCH 26.9 26.0 - 34.0 pg   MCHC 32.4 30.0 - 36.0 g/dL   RDW 15.5 11.5 - 15.5 %   Platelets 210 150 - 400 K/uL  Comprehensive metabolic panel  Result Value Ref Range   Sodium 136 135 - 145 mmol/L   Potassium 4.0 3.5 - 5.1 mmol/L   Chloride 105 96 - 112 mmol/L   CO2 26 19 - 32 mmol/L   Glucose, Bld 267 (H) 70 - 99 mg/dL   BUN 34 (H) 6 - 23 mg/dL   Creatinine, Ser 1.46 (H) 0.50 - 1.10 mg/dL   Calcium 7.8 (L) 8.4 - 10.5 mg/dL   Total Protein 5.9 (L) 6.0 - 8.3 g/dL    Albumin 2.7 (L) 3.5 - 5.2 g/dL   AST 20 0 - 37 U/L   ALT 21 0 - 35 U/L   Alkaline Phosphatase 138 (H) 39 - 117 U/L   Total Bilirubin 0.4 0.3 - 1.2 mg/dL   GFR calc non Af Amer 39 (L) >90 mL/min   GFR calc Af Amer 45 (L) >90 mL/min   Anion gap 5 5 - 15   Dg Chest 2 View  10/02/2014   CLINICAL DATA:  Left chest pain, wheezing, dyspnea  EXAM: CHEST  2 VIEW  COMPARISON:  03/24/2012  FINDINGS: Mild basilar airspace opacities are present, infectious versus atelectatic no large effusions. Heart size is normal and unchanged. Pulmonary vasculature is normal.  IMPRESSION: Mild basilar infiltrate or atelectasis   Electronically Signed   By: Andreas Newport M.D.   On: 10/02/2014 05:24       EKG Interpretation   Date/Time:  Sunday October 21 2014 18:55:30 EDT Ventricular Rate:  76 PR Interval:  118 QRS Duration: 94 QT Interval:  437 QTC Calculation: 491 R Axis:   80 Text Interpretation:  Sinus rhythm Nonspecific T wave abnormality  Borderline prolonged QT interval Confirmed by Ashok Cordia  MD, Lennette Bihari ([id number]) on  10/21/2014 7:02:16 PM      MDM   Iv ns. Labs.  Reviewed nursing notes and prior charts for additional history.   Vascular doppler neg for acute process - see report below:  Patient Information    Patient Name Sex DOB SSN   Lai, Saesee Female December 12, 1955 [id number]    Progress Notes by Charlaine Dalton, RVT at 10/21/2014 6:39 PM    Author: Charlaine Dalton, RVT Service: Vascular Lab Author Type: Cardiovascular Sonographer   Filed: 10/21/2014 6:39 PM Note Time: 10/21/2014 6:39 PM Status: Signed   Editor: Charlaine Dalton, RVT (Cardiovascular Sonographer)     Expand All Collapse All   Left upper extremity venous duplex completed: No evidence of DVT or superficial thrombosis.  Labs pending.  After symptoms present for past day, trop neg.   Vascular doppler neg.  pts current symptoms and exam felt not c/w acs, not c/w acute cns event.   Vitals normal. Normal  use bil ext. Non focal neuro exam. Afeb.   Pt currently appears stable for d/c.    Pt is followed by [facility] - will have f/u closely.   rec close pcp follow up tomorrow for recheck.       Lajean Saver, MD 10/21/14 2024

## 2014-10-21 NOTE — Progress Notes (Signed)
Left upper extremity venous duplex completed:  No evidence of DVT or superficial thrombosis.    

## 2014-10-21 NOTE — ED Notes (Signed)
VAS TECH AWARE OF PATIENT

## 2014-10-26 ENCOUNTER — Ambulatory Visit: Payer: Self-pay | Attending: Family Medicine | Admitting: Family Medicine

## 2014-10-26 ENCOUNTER — Ambulatory Visit (HOSPITAL_COMMUNITY)
Admission: RE | Admit: 2014-10-26 | Discharge: 2014-10-26 | Disposition: A | Discharge status: Home / Self Care | Payer: Self-pay | Source: Ambulatory Visit | Attending: Internal Medicine | Admitting: Internal Medicine

## 2014-10-26 ENCOUNTER — Other Ambulatory Visit: Payer: Self-pay

## 2014-10-26 ENCOUNTER — Encounter: Payer: Self-pay | Admitting: Family Medicine

## 2014-10-26 ENCOUNTER — Telehealth: Payer: Self-pay

## 2014-10-26 VITALS — BP 150/80 | HR 71 | Temp 98.2°F | Resp 18 | Ht 68.0 in | Wt 280.0 lb

## 2014-10-26 DIAGNOSIS — R0600 Dyspnea, unspecified: Secondary | ICD-10-CM

## 2014-10-26 DIAGNOSIS — E118 Type 2 diabetes mellitus with unspecified complications: Secondary | ICD-10-CM

## 2014-10-26 DIAGNOSIS — Z794 Long term (current) use of insulin: Secondary | ICD-10-CM | POA: Insufficient documentation

## 2014-10-26 DIAGNOSIS — J189 Pneumonia, unspecified organism: Secondary | ICD-10-CM

## 2014-10-26 DIAGNOSIS — R52 Pain, unspecified: Secondary | ICD-10-CM | POA: Insufficient documentation

## 2014-10-26 DIAGNOSIS — E119 Type 2 diabetes mellitus without complications: Secondary | ICD-10-CM | POA: Insufficient documentation

## 2014-10-26 DIAGNOSIS — I10 Essential (primary) hypertension: Secondary | ICD-10-CM

## 2014-10-26 DIAGNOSIS — I5033 Acute on chronic diastolic (congestive) heart failure: Secondary | ICD-10-CM

## 2014-10-26 LAB — GLUCOSE, POCT (MANUAL RESULT ENTRY): POC Glucose: 228 mg/dl — AB (ref 70–99)

## 2014-10-26 MED ORDER — IPRATROPIUM-ALBUTEROL 0.5-2.5 (3) MG/3ML IN SOLN: 3.0000 mL | RESPIRATORY_TRACT | Status: DC

## 2014-10-26 MED ORDER — FUROSEMIDE 40 MG PO TABS: 40.0000 mg | ORAL_TABLET | Freq: Every day | ORAL | Status: DC

## 2014-10-26 MED ORDER — ALBUTEROL SULFATE HFA 108 (90 BASE) MCG/ACT IN AERS: 2.0000 | INHALATION_SPRAY | RESPIRATORY_TRACT | Status: DC | PRN

## 2014-10-26 MED ORDER — LISINOPRIL 40 MG PO TABS: 40.0000 mg | ORAL_TABLET | Freq: Every day | ORAL | Status: DC

## 2014-10-26 MED ORDER — IPRATROPIUM-ALBUTEROL 0.5-2.5 (3) MG/3ML IN SOLN
3.0000 mL | Freq: Once | RESPIRATORY_TRACT | Status: AC
  Administered 2014-10-26: 3 mL via RESPIRATORY_TRACT

## 2014-10-26 NOTE — Telephone Encounter (Signed)
Voicemail received from patient today indicating she is experiencing shortness of breath today. Call returned to patient to discuss. Patient indicates shortness of breath began yesterday. She indicates she also had chest pain yesterday. Informed patient that if she is experiencing chest pain she needs to go to ED immediately. Patient indicates she is not having chest pain today, but she continues to have shortness of breath with ambulation. She indicates she does not have an albuterol inhaler she can use for shortness of breath. Patient saw Dr. Jarold Song on 10/08/14.  Discussed patient's symptoms with Dr. Jarold Song. She reiterates if patient having chest pain she must go directly to ED; if she is not having chest pain she will see her today at 1600.  Patient again indicates she is not having chest pain today; she is experiencing shortness of breath with ambulation.  Patient informed Dr. Jarold Song can see her today at 1600.  Patient verbalized understanding, and appointment scheduled.  Patient instructed if she does have chest pain or if she is unable to catch breath before this time she should go to ED rather than to appointment.  Patient verbalized understanding.

## 2014-10-26 NOTE — Progress Notes (Signed)
Patient was hospitalized 2 weeks ago for pneumonia and heart failure. Patient started feeling better after going home but has started feeling worse since Sunday. Patient reports she is short of breath and had chest pains yesterday while walking, described as shooting/stabbing, only lasted 15 seconds. Patient slowed down and took deep breaths, pain went away. Patient is currently reporting no pain. Patient is taking lisinopril-hctz instead of lisinopril, not sure the strength.

## 2014-10-26 NOTE — Progress Notes (Signed)
Subjective:    Patient ID: [name], female    DOB: Feb 03, 1956, 59 y.o.   MRN: [id number]  HPI  59 year old female patient with a history of grade 1 diastolic dysfunction with EF of 65-70% from 09/2014 who was seen 2 weeks ago for a follow-up from hospitalization for community-acquired pneumonia and completed a course of Levaquin.  She complains of shortness of breath for one day and noticed that he was becoming more difficult to perform activities of daily living. She also noticed some chest pain yesterday while walking. States she sometimes has wheezing when she is trying to breathe, and her head feels "full". Denies sore throat, nasal congestion, or earache.  Blood pressure is elevated and she is not here with her antihypertensive and so her dose cannot be verified. She states that blood sugar has been elevated in the 240s but she has been taking 40 units of Lantus rather than the 50 which she was prescribed at her last office visit and I have corrected this.  Past Medical History  Diagnosis Date  . CHF (congestive heart failure)   . Bronchitis   . Diabetes mellitus   . Hypertension   . Ulcer   . Hepatitis C   . Pancreatitis   . Complication of anesthesia   . Refusal of blood transfusions as patient is Jehovah's Witness   . Asthma     Past Surgical History  Procedure Laterality Date  . Tubal ligation    . Cholecystectomy    . Knee arthroscopy    . Tubal ligation    . Eye surgery      History   Social History  . Marital Status: Divorced    Spouse Name: N/A  . Number of Children: N/A  . Years of Education: N/A   Occupational History  . Not on file.   Social History Main Topics  . Smoking status: Former Smoker    Quit date: 10/25/1980  . Smokeless tobacco: Never Used     Comment: quit smoking in 1982  . Alcohol Use: Yes     Comment: ocassionally  . Drug Use: No     Comment: quit 18 yrs ago  . Sexual Activity: Not on file   Other Topics Concern  . Not on  file   Social History Narrative    Allergies  Allergen Reactions  . Morphine And Related     itching  . Shellfish Allergy     swelling    Current Outpatient Prescriptions on File Prior to Visit  Medication Sig Dispense Refill  . amLODipine (NORVASC) 10 MG tablet Take 1 tablet (10 mg total) by mouth daily. 30 tablet 3  . aspirin 325 MG tablet Take 1 tablet (325 mg total) by mouth daily. 30 tablet 3  . atorvastatin (LIPITOR) 20 MG tablet Take 1 tablet (20 mg total) by mouth daily. 30 tablet 3  . carvedilol (COREG) 25 MG tablet Take 1 tablet (25 mg total) by mouth 2 (two) times daily with a meal. 60 tablet 3  . GABAPENTIN PO Take 1 tablet by mouth at bedtime.    . hydrALAZINE (APRESOLINE) 10 MG tablet Take 1 tablet (10 mg total) by mouth 2 (two) times daily. 30 tablet 5  . insulin aspart (NOVOLOG) 100 UNIT/ML injection Inject 10 Units into the skin 3 (three) times daily before meals. 10 mL 3  . Insulin Glargine (LANTUS SOLOSTAR) 100 UNIT/ML Solostar Pen Inject 50 Units into the skin daily at 10 pm. 5  pen 3  . ranitidine (ZANTAC) 150 MG tablet Take 150 mg by mouth 2 (two) times daily.    . valACYclovir (VALTREX) 500 MG tablet Take 1 tablet (500 mg total) by mouth daily as needed (outbreak). 30 tablet 2  . metFORMIN (GLUCOPHAGE) 1000 MG tablet Take 1,000 mg by mouth 2 (two) times daily with a meal.    . oxyCODONE-acetaminophen (PERCOCET/ROXICET) 5-325 MG per tablet Take 1-2 tablets by mouth every 4 (four) hours as needed for severe pain. (Patient not taking: Reported on 10/26/2014) 15 tablet 0  . potassium chloride (K-DUR) 10 MEQ tablet Take 1 tablet (10 mEq total) by mouth daily. (Patient taking differently: Take 10 mEq by mouth as needed. TAKE WITH LASIX 20 MG AS NEEDED) 30 tablet 5   No current facility-administered medications on file prior to visit.       Review of Systems General: negative for fever, weight loss, appetite change Eyes: no visual symptoms. ENT: no ear symptoms, no  sinus tenderness, no nasal congestion or sore throat. Neck: no pain  Respiratory: see HPI Cardiovascular:see HPI Gastrointestinal: no abdominal pain, no diarrhea, no constipation Genito-Urinary: no urinary frequency, no dysuria, no polyuria. Hematologic: no bruising Endocrine: no cold or heat intolerance Neurological: no headaches, no seizures, no tremors Musculoskeletal: no joint pains, no joint swelling     Objective: Filed Vitals:   10/26/14 1608  BP: 150/80  Pulse: 71  Temp: 98.2 F (36.8 C)  Resp: 18      Physical Exam  Constitutional: normal appearing,  Eyes: PERRLA HENT: Head is atraumatic, normal sinuses, normal oropharynx, normal appearing tonsils and palate Neck: normal range of motion, no thyromegaly, no JVD cardiovascular: normal rate and rhythm, normal heart sounds, no murmurs, rub or gallop, no pedal edema Respiratory: clear to auscultation bilaterally, no wheezes, no rales, no rhonchi Abdomen: soft, not tender to palpation, normal bowel sounds, no enlarged organs Extremities: Full ROM, no tenderness in joints Skin: warm and dry, no lesions. Neurological: alert, oriented x3, cranial nerves I-XII grossly intact Psychological: normal mood.        Assessment & Plan:  59 year old female with multiple co morbidities and dyspnea of recent onset.  Acute on chronic diastolic congestive heart failure with dyspnea: Her oxygen saturation is 96% which is at her baseline and she has no evidence of fluid overload given her diagnosis of diastolic heart failure and EF is preserved at 65-70%. Nebulizer treatments given in the clinic and I'll give her an MDI as well. She knows to present to the ED in the event that symptoms do not resolve.  Diabetes mellitus: Advised to use current dosing of Lantus is 50 and rather than 40. Units.  Hypertension: Uncontrolled, we are unable to verify her accurate list of antihypertensives so she has been advised to bring her bottles at  her next office visit.  Community-acquired pneumonia: This is resolved and I do not think her dyspnea secondary to this.

## 2014-10-26 NOTE — Patient Instructions (Signed)

## 2014-10-28 ENCOUNTER — Encounter (HOSPITAL_BASED_OUTPATIENT_CLINIC_OR_DEPARTMENT_OTHER): Payer: Self-pay

## 2014-10-29 NOTE — Progress Notes (Signed)
Quick Note:  Labs addressed at office as well as medications and patient was made aware. ______ 

## 2014-10-30 ENCOUNTER — Ambulatory Visit: Payer: Self-pay

## 2014-11-06 ENCOUNTER — Ambulatory Visit: Payer: Self-pay | Attending: Internal Medicine

## 2014-11-08 ENCOUNTER — Encounter: Payer: Self-pay | Admitting: Family Medicine

## 2014-11-08 ENCOUNTER — Ambulatory Visit: Payer: Self-pay | Attending: Family Medicine | Admitting: Family Medicine

## 2014-11-08 VITALS — BP 113/68 | HR 61 | Temp 98.1°F | Resp 16 | Ht 68.0 in | Wt 279.0 lb

## 2014-11-08 DIAGNOSIS — E1165 Type 2 diabetes mellitus with hyperglycemia: Secondary | ICD-10-CM | POA: Insufficient documentation

## 2014-11-08 DIAGNOSIS — A6000 Herpesviral infection of urogenital system, unspecified: Secondary | ICD-10-CM

## 2014-11-08 DIAGNOSIS — Z87891 Personal history of nicotine dependence: Secondary | ICD-10-CM | POA: Insufficient documentation

## 2014-11-08 DIAGNOSIS — R198 Other specified symptoms and signs involving the digestive system and abdomen: Secondary | ICD-10-CM | POA: Insufficient documentation

## 2014-11-08 DIAGNOSIS — I5033 Acute on chronic diastolic (congestive) heart failure: Secondary | ICD-10-CM | POA: Insufficient documentation

## 2014-11-08 DIAGNOSIS — Z6841 Body Mass Index (BMI) 40.0 and over, adult: Secondary | ICD-10-CM | POA: Insufficient documentation

## 2014-11-08 DIAGNOSIS — E118 Type 2 diabetes mellitus with unspecified complications: Secondary | ICD-10-CM

## 2014-11-08 DIAGNOSIS — N183 Chronic kidney disease, stage 3 unspecified: Secondary | ICD-10-CM

## 2014-11-08 DIAGNOSIS — N9089 Other specified noninflammatory disorders of vulva and perineum: Secondary | ICD-10-CM | POA: Insufficient documentation

## 2014-11-08 DIAGNOSIS — D649 Anemia, unspecified: Secondary | ICD-10-CM | POA: Insufficient documentation

## 2014-11-08 DIAGNOSIS — R358 Other polyuria: Secondary | ICD-10-CM | POA: Insufficient documentation

## 2014-11-08 DIAGNOSIS — IMO0002 Reserved for concepts with insufficient information to code with codable children: Secondary | ICD-10-CM

## 2014-11-08 DIAGNOSIS — I129 Hypertensive chronic kidney disease with stage 1 through stage 4 chronic kidney disease, or unspecified chronic kidney disease: Secondary | ICD-10-CM | POA: Insufficient documentation

## 2014-11-08 DIAGNOSIS — Z124 Encounter for screening for malignant neoplasm of cervix: Secondary | ICD-10-CM | POA: Insufficient documentation

## 2014-11-08 DIAGNOSIS — N898 Other specified noninflammatory disorders of vagina: Secondary | ICD-10-CM | POA: Insufficient documentation

## 2014-11-08 DIAGNOSIS — I10 Essential (primary) hypertension: Secondary | ICD-10-CM | POA: Insufficient documentation

## 2014-11-08 LAB — GLUCOSE, POCT (MANUAL RESULT ENTRY): POC Glucose: 331 mg/dl — AB (ref 70–99)

## 2014-11-08 LAB — BASIC METABOLIC PANEL
BUN: 40 mg/dL — ABNORMAL HIGH (ref 6–23)
CO2: 23 mEq/L (ref 19–32)
Calcium: 9.1 mg/dL (ref 8.4–10.5)
Chloride: 105 mEq/L (ref 96–112)
Creat: 1.47 mg/dL — ABNORMAL HIGH (ref 0.50–1.10)
Glucose, Bld: 243 mg/dL — ABNORMAL HIGH (ref 70–99)
Potassium: 4.9 mEq/L (ref 3.5–5.3)
Sodium: 138 mEq/L (ref 135–145)

## 2014-11-08 LAB — HEMOCCULT GUIAC POC 1CARD (OFFICE): Fecal Occult Blood, POC: NEGATIVE

## 2014-11-08 MED ORDER — TRUE METRIX METER W/DEVICE KIT: 1.0000 | PACK | Status: DC | PRN

## 2014-11-08 MED ORDER — FERROUS SULFATE 325 (65 FE) MG PO TABS: 325.0000 mg | ORAL_TABLET | Freq: Two times a day (BID) | ORAL | Status: DC

## 2014-11-08 MED ORDER — FUROSEMIDE 40 MG PO TABS: 40.0000 mg | ORAL_TABLET | Freq: Two times a day (BID) | ORAL | Status: DC

## 2014-11-08 MED ORDER — GLUCOSE BLOOD VI STRP: 1.0000 | ORAL_STRIP | Freq: Three times a day (TID) | Status: DC

## 2014-11-08 MED ORDER — FLUCONAZOLE 150 MG PO TABS: 150.0000 mg | ORAL_TABLET | ORAL | Status: DC

## 2014-11-08 MED ORDER — METOCLOPRAMIDE HCL 10 MG PO TABS: 10.0000 mg | ORAL_TABLET | Freq: Three times a day (TID) | ORAL | Status: DC

## 2014-11-08 MED ORDER — INSULIN ASPART 100 UNIT/ML ~~LOC~~ SOLN: 15.0000 [IU] | Freq: Three times a day (TID) | SUBCUTANEOUS | Status: DC

## 2014-11-08 MED ORDER — VALACYCLOVIR HCL 500 MG PO TABS: 500.0000 mg | ORAL_TABLET | Freq: Two times a day (BID) | ORAL | Status: DC

## 2014-11-08 NOTE — Progress Notes (Signed)
Establish Care F/U DM  Stated glucose been elevated 176-214

## 2014-11-08 NOTE — Progress Notes (Signed)
   Subjective:    Patient ID: [name], female    DOB: 05-09-1956, 59 y.o.   MRN: [id number] CC: meet new PCP, DM2, HTN, diastolic CHF  HPI  1. CHRONIC DIABETES  Disease Monitoring  Blood Sugar Ranges: 152-446  Polyuria: yes on lasix   Visual problems: yes   Medication Compliance: yes  Medication Side Effects  Hypoglycemia: no   Preventitive Health Care  Eye Exam: due   Foot Exam: done today   Diet pattern: 3 light meals, patient with nausea and early satiety  Exercise: minimal due to fatigue and chronic knee pain.   2. HTN: compliant with regimen. No CP, SOB with exercise is improving. Has LE edema.    3. Vulvar irritation: has hx of genital herpes. No active lesions. Vulva is irritated   4. Loose stools: for many months. No blood in stool. Has a colonoscopy 3 years ago or so.   Soc Hx: former smoker, quit  Review of Systems As per HPI     Objective:   Physical Exam BP 113/68 mmHg  Pulse 61  Temp(Src) 98.1 F (36.7 C) (Oral)  Resp 16  Ht 5\' 8"  (1.727 m)  Wt 279 lb (126.554 kg)  BMI 42.43 kg/m2  SpO2 98%  Wt Readings from Last 3 Encounters:  11/08/14 279 lb (126.554 kg)  10/26/14 280 lb (127.007 kg)  10/21/14 285 lb (129.275 kg)   BP Readings from Last 3 Encounters:  10/26/14 150/80  10/21/14 143/61  10/11/14 148/72  General appearance: alert, cooperative, no distress and morbidly obese Neck: JVD - 3 cm above sternal notch, no adenopathy, no carotid bruit, supple, symmetrical, trachea midline and thyroid not enlarged, symmetric, no tenderness/mass/nodules Lungs: clear to auscultation bilaterally Heart: regular rate and rhythm, S1, S2 normal, no murmur, click, rub or gallop  Gyn: vulva erythema and mild edema. Scant vaginal discharge. Redundant vaginal mucosa. Unable to completely visualize cervix.  Rectal: normal tone, FOBT negative Abdomen: obese, soft, no masses, non tender Extremities: edema 1 + to mid shin debris between toes   Lab Results    Component Value Date   HGBA1C 9.70 10/08/2014   CBG 331        Assessment & Plan:

## 2014-11-08 NOTE — Patient Instructions (Addendum)
[name],  Thank you for coming in today.  1. Blood pressures: well controlled Increase lasix to twice daily and elevate legs to treat swelling Low salt diet  2. Diabetes: Still with high sugars Unable to take oral medications at this time due to kidney insufficiency Increase novolog to 15 U three times a day Continue lantus 50 U daily Meter and test strips sent   3. Diastolic congestive heart failure: Lasix 40 mg twice daily    4. Anemia: start iron therapy  5. Due for pap: gyn referral  6. Loose stools: will get records or last colonoscopy. Stool card negative for blood today.   7. Vulvar irritation: wet prep today. Take valtrex twice daily for 5 days as needed for outbreaks. Diflucan for suspected yeast now and in 3 days.   F/u in 4 weeks with nurse for blood sugar check F/u with me in 2 months for diabetes, CHF, HTN, renal insufficiency

## 2014-11-09 ENCOUNTER — Telehealth: Payer: Self-pay | Admitting: *Deleted

## 2014-11-09 LAB — MICROALBUMIN / CREATININE URINE RATIO
Creatinine, Urine: 111.5 mg/dL
Microalb Creat Ratio: 2127.4 mg/g — ABNORMAL HIGH (ref 0.0–30.0)
Microalb, Ur: 237.2 mg/dL — ABNORMAL HIGH (ref <2.0)

## 2014-11-09 LAB — CERVICOVAGINAL ANCILLARY ONLY: Wet Prep (BD Affirm): NEGATIVE

## 2014-11-09 NOTE — Telephone Encounter (Signed)
-----   Message from Boykin Nearing, MD sent at 11/09/2014  8:58 AM EDT ----- Renal function remains the same at 1.5 Cr. No improvement since hospital. Normal potassium and bicarb.  Elevated urine microalbumin.  Plan: no oral diabetes medication for now, no metformin, januvia, farxiga etc. Continue insulin therapy. Continue lisinopril for BP control and renal protection. Referral to renal placed for assistance with monitoring kidney function. Renal ultrasound ordered. Needs to be scheduled

## 2014-11-09 NOTE — Telephone Encounter (Signed)
[place] Appointment on May 12 arriving at 8:45 at [facility] Pt aware

## 2014-11-09 NOTE — Assessment & Plan Note (Addendum)
A: persistent renal insufficiency, suspected CKD P: avoid metformin and oral hypoglycemics. Avoid NSAID. Continue lisinopril 40 mg daily Renal ultrasound Renal referral  Control BP Control blood sugars

## 2014-11-09 NOTE — Telephone Encounter (Signed)
Pt aware of results and information Will call Monday with CT appointment

## 2014-11-09 NOTE — Telephone Encounter (Signed)
-----   Message from Boykin Nearing, MD sent at 11/09/2014  8:54 AM EDT ----- Renal function remains the same at 1.5 Cr. No improvement since hospital. Normal potassium and bicarb.  Elevate urine microalbumin.  Plan: no oral diabetes medication for now, no metformin, januvia, farxiga etc. Continue insulin therapy. Continue lisinopril for BP control and renal protection. Referral to renal placed for assistance with monitoring kidney function.

## 2014-11-10 NOTE — Assessment & Plan Note (Signed)
A: Diastolic congestive heart failure: improving w/o evidence of acute exacerbation.  P: Lasix 40 mg twice daily

## 2014-11-10 NOTE — Assessment & Plan Note (Signed)
A: pap smear attempted. Unable to visualize cervix due to body habitus P: gyn referral for pap

## 2014-11-10 NOTE — Assessment & Plan Note (Signed)
Anemia: start iron therapy

## 2014-11-10 NOTE — Assessment & Plan Note (Signed)
Vulvar irritation: wet prep today. Take valtrex twice daily for 5 days as needed for outbreaks. Diflucan for suspected yeast now and in 3 days.

## 2014-11-10 NOTE — Assessment & Plan Note (Signed)
Diabetes: Still with high sugars Unable to take oral medications at this time due to kidney insufficiency Increase novolog to 15 U three times a day Continue lantus 50 U daily Meter and test strips ordered

## 2014-11-10 NOTE — Assessment & Plan Note (Signed)
Blood pressures: well controlled Increase lasix to twice daily and elevate legs to treat swelling Low salt diet

## 2014-11-12 ENCOUNTER — Telehealth: Payer: Self-pay | Admitting: Family Medicine

## 2014-11-12 DIAGNOSIS — K089 Disorder of teeth and supporting structures, unspecified: Secondary | ICD-10-CM

## 2014-11-12 NOTE — Telephone Encounter (Signed)
Patient called to request a referral to the dentist. Please f/u

## 2014-11-13 DIAGNOSIS — K089 Disorder of teeth and supporting structures, unspecified: Secondary | ICD-10-CM | POA: Insufficient documentation

## 2014-11-13 NOTE — Telephone Encounter (Signed)
Dental referral placed.

## 2014-11-22 ENCOUNTER — Ambulatory Visit (HOSPITAL_COMMUNITY)
Admission: RE | Admit: 2014-11-22 | Discharge: 2014-11-22 | Disposition: A | Discharge status: Home / Self Care | Payer: Self-pay | Source: Ambulatory Visit | Attending: Family Medicine | Admitting: Family Medicine

## 2014-11-22 DIAGNOSIS — N189 Chronic kidney disease, unspecified: Secondary | ICD-10-CM | POA: Insufficient documentation

## 2014-11-22 DIAGNOSIS — I129 Hypertensive chronic kidney disease with stage 1 through stage 4 chronic kidney disease, or unspecified chronic kidney disease: Secondary | ICD-10-CM | POA: Insufficient documentation

## 2014-11-22 DIAGNOSIS — N183 Chronic kidney disease, stage 3 unspecified: Secondary | ICD-10-CM

## 2014-12-05 ENCOUNTER — Other Ambulatory Visit: Payer: Self-pay | Admitting: Internal Medicine

## 2014-12-05 MED ORDER — ALBUTEROL SULFATE HFA 108 (90 BASE) MCG/ACT IN AERS: 2.0000 | INHALATION_SPRAY | RESPIRATORY_TRACT | Status: DC | PRN

## 2014-12-06 ENCOUNTER — Encounter: Payer: Self-pay | Admitting: Obstetrics and Gynecology

## 2014-12-24 ENCOUNTER — Ambulatory Visit: Payer: Self-pay | Attending: Family Medicine | Admitting: *Deleted

## 2014-12-24 VITALS — BP 130/60 | HR 66 | Temp 98.4°F | Resp 20 | Ht 68.0 in | Wt 283.4 lb

## 2014-12-24 DIAGNOSIS — I10 Essential (primary) hypertension: Secondary | ICD-10-CM | POA: Insufficient documentation

## 2014-12-24 DIAGNOSIS — E119 Type 2 diabetes mellitus without complications: Secondary | ICD-10-CM

## 2014-12-24 DIAGNOSIS — Z87891 Personal history of nicotine dependence: Secondary | ICD-10-CM | POA: Insufficient documentation

## 2014-12-24 DIAGNOSIS — E1165 Type 2 diabetes mellitus with hyperglycemia: Secondary | ICD-10-CM | POA: Insufficient documentation

## 2014-12-24 DIAGNOSIS — Z794 Long term (current) use of insulin: Secondary | ICD-10-CM | POA: Insufficient documentation

## 2014-12-24 LAB — GLUCOSE, POCT (MANUAL RESULT ENTRY): POC Glucose: 174 mg/dL — AB (ref 70–99)

## 2014-12-24 MED ORDER — INSULIN GLARGINE 100 UNIT/ML SOLOSTAR PEN: 52.0000 [IU] | PEN_INJECTOR | Freq: Every day | SUBCUTANEOUS | Status: DC

## 2014-12-24 MED ORDER — RANITIDINE HCL 150 MG PO TABS: 150.0000 mg | ORAL_TABLET | Freq: Two times a day (BID) | ORAL | Status: DC

## 2014-12-24 MED ORDER — CEPHALEXIN 500 MG PO CAPS: 500.0000 mg | ORAL_CAPSULE | Freq: Three times a day (TID) | ORAL | Status: DC

## 2014-12-24 MED ORDER — GABAPENTIN 300 MG PO CAPS: 300.0000 mg | ORAL_CAPSULE | Freq: Every day | ORAL | Status: DC

## 2014-12-24 NOTE — Patient Instructions (Addendum)
Hypoglycemia Hypoglycemia occurs when the glucose in your blood is too low. Glucose is a type of sugar that is your body's main energy source. Hormones, such as insulin and glucagon, control the level of glucose in the blood. Insulin lowers blood glucose and glucagon increases blood glucose. Having too much insulin in your blood stream, or not eating enough food containing sugar, can result in hypoglycemia. Hypoglycemia can happen to people with or without diabetes. It can develop quickly and can be a medical emergency.  CAUSES   Missing or delaying meals.  Not eating enough carbohydrates at meals.  Taking too much diabetes medicine.  Not timing your oral diabetes medicine or insulin doses with meals, snacks, and exercise.  Nausea and vomiting.  Certain medicines.  Severe illnesses, such as hepatitis, kidney disorders, and certain eating disorders.  Increased activity or exercise without eating something extra or adjusting medicines.  Drinking too much alcohol.  A nerve disorder that affects body functions like your heart rate, blood pressure, and digestion (autonomic neuropathy).  A condition where the stomach muscles do not function properly (gastroparesis). Therefore, medicines and food may not absorb properly.  Rarely, a tumor of the pancreas can produce too much insulin. SYMPTOMS   Hunger.  Sweating (diaphoresis).  Change in body temperature.  Shakiness.  Headache.  Anxiety.  Lightheadedness.  Irritability.  Difficulty concentrating.  Dry mouth.  Tingling or numbness in the hands or feet.  Restless sleep or sleep disturbances.  Altered speech and coordination.  Change in mental status.  Seizures or prolonged convulsions.  Combativeness.  Drowsiness (lethargic).  Weakness.  Increased heart rate or palpitations.  Confusion.  Pale, gray skin color.  Blurred or double vision.  Fainting. DIAGNOSIS  A physical exam and medical history will be  performed. Your caregiver may make a diagnosis based on your symptoms. Blood tests and other lab tests may be performed to confirm a diagnosis. Once the diagnosis is made, your caregiver will see if your signs and symptoms go away once your blood glucose is raised.  TREATMENT  Usually, you can easily treat your hypoglycemia when you notice symptoms.  Check your blood glucose. If it is less than 70 mg/dl, take one of the following:   3-4 glucose tablets.    cup juice.    cup regular soda.   1 cup skim milk.   -1 tube of glucose gel.   5-6 hard candies.   Avoid high-fat drinks or food that may delay a rise in blood glucose levels.  Do not take more than the recommended amount of sugary foods, drinks, gel, or tablets. Doing so will cause your blood glucose to go too high.   Wait 10-15 minutes and recheck your blood glucose. If it is still less than 70 mg/dl or below your target range, repeat treatment.   Eat a snack if it is more than 1 hour until your next meal.  There may be a time when your blood glucose may go so low that you are unable to treat yourself at home when you start to notice symptoms. You may need someone to help you. You may even faint or be unable to swallow. If you cannot treat yourself, someone will need to bring you to the hospital.  HOME CARE INSTRUCTIONS  If you have diabetes, follow your diabetes management plan by:  Taking your medicines as directed.  Following your exercise plan.  Following your meal plan. Do not skip meals. Eat on time.  Testing your blood   glucose regularly. Check your blood glucose before and after exercise. If you exercise longer or different than usual, be sure to check blood glucose more frequently.  Wearing your medical alert jewelry that says you have diabetes.  Identify the cause of your hypoglycemia. Then, develop ways to prevent the recurrence of hypoglycemia.  Do not take a hot bath or shower right after an  insulin shot.  Always carry treatment with you. Glucose tablets are the easiest to carry.  If you are going to drink alcohol, drink it only with meals.  Tell friends or family members ways to keep you safe during a seizure. This may include removing hard or sharp objects from the area or turning you on your side.  Maintain a healthy weight. SEEK MEDICAL CARE IF:   You are having problems keeping your blood glucose in your target range.  You are having frequent episodes of hypoglycemia.  You feel you might be having side effects from your medicines.  You are not sure why your blood glucose is dropping so low.  You notice a change in vision or a new problem with your vision. SEEK IMMEDIATE MEDICAL CARE IF:   Confusion develops.  A change in mental status occurs.  The inability to swallow develops.  Fainting occurs. Document Released: 06/29/2005 Document Revised: 07/04/2013 Document Reviewed: 10/26/2011 [facility] Patient Information 2015 [place], [place]. This information is not intended to replace advice given to you by your health care provider. Make sure you discuss any questions you have with your health care provider. Diabetic Neuropathy Diabetic neuropathy is a nerve disease or nerve damage that is caused by diabetes mellitus. About half of all people with diabetes mellitus have some form of nerve damage. Nerve damage is more common in those who have had diabetes mellitus for many years and who generally have not had good control of their blood sugar (glucose) level. Diabetic neuropathy is a common complication of diabetes mellitus. There are three more common types of diabetic neuropathy and a fourth type that is less common and less understood:  Peripheral neuropathy--This is the most common type of diabetic neuropathy. It causes damage to the nerves of the feet and legs first and then eventually the hands and arms.The damage affects the ability to sense touch. Autonomic  neuropathy--This type causes damage to the autonomic nervous system, which controls the following functions: Heartbeat. Body temperature. Blood pressure. Urination. Digestion. Sweating. Sexual function. Focal neuropathy--Focal neuropathy can be painful and unpredictable and occurs most often in older adults with diabetes mellitus. It involves a specific nerve or one area and often comes on suddenly. It usually does not cause long-term problems. Radiculoplexus neuropathy-- Sometimes called lumbosacral radiculoplexus neuropathy, radiculoplexus neuropathy affects the nerves of the thighs, hips, buttocks, or legs. It is more common in people with type 2 diabetes mellitus and in older men. It is characterized by debilitating pain, weakness, and atrophy, usually in the thigh muscles. CAUSES  The cause of peripheral, autonomic, and focal neuropathies is diabetes mellitus that is uncontrolled and high glucose levels. The cause of radiculoplexus neuropathy is unknown. However, it is thought to be caused by inflammation related to uncontrolled glucose levels. SIGNS AND SYMPTOMS  Peripheral Neuropathy Peripheral neuropathy develops slowly over time. When the nerves of the feet and legs no longer work there may be:  Burning, stabbing, or aching pain in the legs or feet. Inability to feel pressure or pain in your feet. This can lead to: Thick calluses over pressure areas. Pressure sores.  Ulcers. Foot deformities. Reduced ability to feel temperature changes. Muscle weakness. Autonomic Neuropathy The symptoms of autonomic neuropathy vary depending on which nerves are affected. Symptoms may include: Problems with digestion, such as: Feeling sick to your stomach (nausea). Vomiting. Bloating. Constipation. Diarrhea. Abdominal pain. Difficulty with urination. This occurs if you lose your ability to sense when your bladder is full. Problems include: Urine leakage (incontinence). Inability to empty your  bladder completely (retention). Rapid or irregular heartbeat (palpitations). Blood pressure drops when you stand up (orthostatic hypotension). When you stand up you may feel: Dizzy. Weak. Faint. In men, inability to attain and maintain an erection. In women, vaginal dryness and problems with decreased sexual desire and arousal. Problems with body temperature regulation. Increased or decreased sweating. Focal Neuropathy Abnormal eye movements or abnormal alignment of both eyes. Weakness in the wrist. Foot drop. This results in an inability to lift the foot properly and abnormal walking or foot movement. Paralysis on one side of your face (Bell palsy). Chest or abdominal pain. Radiculoplexus Neuropathy Sudden, severe pain in your hip, thigh, or buttocks. Weakness and wasting of thigh muscles. Difficulty rising from a seated position. Abdominal swelling. Unexplained weight loss (usually more than 10 lb [4.5 kg]). DIAGNOSIS  Peripheral Neuropathy Your senses may be tested. Sensory function testing can be done with: A light touch using a monofilament. A vibration with tuning fork. A sharp sensation with a pin prick. Other tests that can help diagnose neuropathy are: Nerve conduction velocity. This test checks the transmission of an electrical current through a nerve. Electromyography. This shows how muscles respond to electrical signals transmitted by nearby nerves. Quantitative sensory testing. This is used to assess how your nerves respond to vibrations and changes in temperature. Autonomic Neuropathy Diagnosis is often based on reported symptoms. Tell your health care provider if you experience:  Dizziness.  Constipation.  Diarrhea.  Inappropriate urination or inability to urinate.  Inability to get or maintain an erection.  Tests that may be done include:  Electrocardiography or Holter monitor. These are tests that can help show problems with the heart rate or heart  rhythm.  An X-Farver exam may be done. Focal Neuropathy Diagnosis is made based on your symptoms and what your health care provider finds during your exam. Other tests may be done. They may include: Nerve conduction velocities. This checks the transmission of electrical current through a nerve. Electromyography. This shows how muscles respond to electrical signals transmitted by nearby nerves. Quantitative sensory testing. This test is used to assess how your nerves respond to vibration and changes in temperature. Radiculoplexus Neuropathy Often the first thing is to eliminate any other issue or problems that might be the cause, as there is no stick test for diagnosis. X-Luby exam of your spine and lumbar region. Spinal tap to rule out cancer. MRI to rule out other lesions. TREATMENT  Once nerve damage occurs, it cannot be reversed. The goal of treatment is to keep the disease or nerve damage from getting worse and affecting more nerve fibers. Controlling your blood glucose level is the key. Most people with radiculoplexus neuropathy see at least a partial improvement over time. You will need to keep your blood glucose and HbA1c levels in the target range determined by your health care provider. Things that help control blood glucose levels include:  Blood glucose monitoring.  Meal planning.  Physical activity.  Diabetes medicine.  Over time, maintaining lower blood glucose levels helps lessen symptoms. Sometimes, prescription pain medicine is needed.  HOME CARE INSTRUCTIONS: Do not smoke. Keep your blood glucose level in the range that you and your health care provider have determined acceptable for you. Keep your blood pressure level in the range that you and your health care provider have determined acceptable for you. Eat a well-balanced diet. Be active every day. Check your feet every day. SEEK MEDICAL CARE IF:  You have burning, stabbing, or aching pain in the legs or feet. You are  unable to feel pressure or pain in your feet. You develop problems with digestion such as: Nausea. Vomiting. Bloating. Constipation. Diarrhea. Abdominal pain. You have difficulty with urination, such as: Incontinence. Retention. You have palpitations. You develop orthostatic hypotension. When you stand up you may feel: Dizzy. Weak. Faint. You cannot attain and maintain an erection (in men). You have vaginal dryness and problems with decreased sexual desire and arousal (in women). You have severe pain in your thighs, legs, or buttocks. You have unexplained weight loss. Document Released: 09/07/2001 Document Revised: 04/19/2013 Document Reviewed: 12/08/2012 [facility] Patient Information 2015 [place], [place]. This information is not intended to replace advice given to you by your health care provider. Make sure you discuss any questions you have with your health care provider. Diabetes and Foot Care Diabetes may cause you to have problems because of poor blood supply (circulation) to your feet and legs. This may cause the skin on your feet to become thinner, break easier, and heal more slowly. Your skin may become dry, and the skin may peel and crack. You may also have nerve damage in your legs and feet causing decreased feeling in them. You may not notice minor injuries to your feet that could lead to infections or more serious problems. Taking care of your feet is one of the most important things you can do for yourself.  HOME CARE INSTRUCTIONS Wear shoes at all times, even in the house. Do not go barefoot. Bare feet are easily injured. Check your feet daily for blisters, cuts, and redness. If you cannot see the bottom of your feet, use a mirror or ask someone for help. Wash your feet with warm water (do not use hot water) and mild soap. Then pat your feet and the areas between your toes until they are completely dry. Do not soak your feet as this can dry your skin. Apply a moisturizing  lotion or petroleum jelly (that does not contain alcohol and is unscented) to the skin on your feet and to dry, brittle toenails. Do not apply lotion between your toes. Trim your toenails straight across. Do not dig under them or around the cuticle. File the edges of your nails with an emery board or nail file. Do not cut corns or calluses or try to remove them with medicine. Wear clean socks or stockings every day. Make sure they are not too tight. Do not wear knee-high stockings since they may decrease blood flow to your legs. Wear shoes that fit properly and have enough cushioning. To break in new shoes, wear them for just a few hours a day. This prevents you from injuring your feet. Always look in your shoes before you put them on to be sure there are no objects inside. Do not cross your legs. This may decrease the blood flow to your feet. If you find a minor scrape, cut, or break in the skin on your feet, keep it and the skin around it clean and dry. These areas may be cleansed with mild soap and water. Do  not cleanse the area with peroxide, alcohol, or iodine. When you remove an adhesive bandage, be sure not to damage the skin around it. If you have a wound, look at it several times a day to make sure it is healing. Do not use heating pads or hot water bottles. They may burn your skin. If you have lost feeling in your feet or legs, you may not know it is happening until it is too late. Make sure your health care provider performs a complete foot exam at least annually or more often if you have foot problems. Report any cuts, sores, or bruises to your health care provider immediately. SEEK MEDICAL CARE IF:  You have an injury that is not healing. You have cuts or breaks in the skin. You have an ingrown nail. You notice redness on your legs or feet. You feel burning or tingling in your legs or feet. You have pain or cramps in your legs and feet. Your legs or feet are numb. Your feet always  feel cold. SEEK IMMEDIATE MEDICAL CARE IF:  There is increasing redness, swelling, or pain in or around a wound. There is a red line that goes up your leg. Pus is coming from a wound. You develop a fever or as directed by your health care provider. You notice a bad smell coming from an ulcer or wound. Document Released: 06/26/2000 Document Revised: 03/01/2013 Document Reviewed: 12/06/2012 [facility] Patient Information 2015 [place], [place]. This information is not intended to replace advice given to you by your health care provider. Make sure you discuss any questions you have with your health care provider. Diabetes Mellitus and Food It is important for you to manage your blood sugar (glucose) level. Your blood glucose level can be greatly affected by what you eat. Eating healthier foods in the appropriate amounts throughout the day at about the same time each day will help you control your blood glucose level. It can also help slow or prevent worsening of your diabetes mellitus. Healthy eating may even help you improve the level of your blood pressure and reach or maintain a healthy weight.  HOW CAN FOOD AFFECT ME? Carbohydrates Carbohydrates affect your blood glucose level more than any other type of food. Your dietitian will help you determine how many carbohydrates to eat at each meal and teach you how to count carbohydrates. Counting carbohydrates is important to keep your blood glucose at a healthy level, especially if you are using insulin or taking certain medicines for diabetes mellitus. Alcohol Alcohol can cause sudden decreases in blood glucose (hypoglycemia), especially if you use insulin or take certain medicines for diabetes mellitus. Hypoglycemia can be a life-threatening condition. Symptoms of hypoglycemia (sleepiness, dizziness, and disorientation) are similar to symptoms of having too much alcohol.  If your health care provider has given you approval to drink alcohol, do so in  moderation and use the following guidelines: Women should not have more than one drink per day, and men should not have more than two drinks per day. One drink is equal to: 12 oz of beer. 5 oz of wine. 1 oz of hard liquor. Do not drink on an empty stomach. Keep yourself hydrated. Have water, diet soda, or unsweetened iced tea. Regular soda, juice, and other mixers might contain a lot of carbohydrates and should be counted. WHAT FOODS ARE NOT RECOMMENDED? As you make food choices, it is important to remember that all foods are not the same. Some foods have fewer nutrients per serving  than other foods, even though they might have the same number of calories or carbohydrates. It is difficult to get your body what it needs when you eat foods with fewer nutrients. Examples of foods that you should avoid that are high in calories and carbohydrates but low in nutrients include: Trans fats (most processed foods list trans fats on the Nutrition Facts label). Regular soda. Juice. Candy. Sweets, such as cake, pie, doughnuts, and cookies. Fried foods. WHAT FOODS CAN I EAT? Have nutrient-rich foods, which will nourish your body and keep you healthy. The food you should eat also will depend on several factors, including: The calories you need. The medicines you take. Your weight. Your blood glucose level. Your blood pressure level. Your cholesterol level. You also should eat a variety of foods, including: Protein, such as meat, poultry, fish, tofu, nuts, and seeds (lean animal proteins are best). Fruits. Vegetables. Dairy products, such as milk, cheese, and yogurt (low fat is best). Breads, grains, pasta, cereal, rice, and beans. Fats such as olive oil, trans fat-free margarine, canola oil, avocado, and olives. DOES EVERYONE WITH DIABETES MELLITUS HAVE THE SAME MEAL PLAN? Because every person with diabetes mellitus is different, there is not one meal plan that works for everyone. It is very  important that you meet with a dietitian who will help you create a meal plan that is just right for you. Document Released: 03/26/2005 Document Revised: 07/04/2013 Document Reviewed: 05/26/2013 [facility] Patient Information 2015 [place], [place]. This information is not intended to replace advice given to you by your health care provider. Make sure you discuss any questions you have with your health care provider. Basic Carbohydrate Counting for Diabetes Mellitus Carbohydrate counting is a method for keeping track of the amount of carbohydrates you eat. Eating carbohydrates naturally increases the level of sugar (glucose) in your blood, so it is important for you to know the amount that is okay for you to have in every meal. Carbohydrate counting helps keep the level of glucose in your blood within normal limits. The amount of carbohydrates allowed is different for every person. A dietitian can help you calculate the amount that is right for you. Once you know the amount of carbohydrates you can have, you can count the carbohydrates in the foods you want to eat. Carbohydrates are found in the following foods: Grains, such as breads and cereals. Dried beans and soy products. Starchy vegetables, such as potatoes, peas, and corn. Fruit and fruit juices. Milk and yogurt. Sweets and snack foods, such as cake, cookies, candy, chips, soft drinks, and fruit drinks. CARBOHYDRATE COUNTING There are two ways to count the carbohydrates in your food. You can use either of the methods or a combination of both. Reading the "Nutrition Facts" on [place] The "Nutrition Facts" is an area that is included on the labels of almost all packaged food and beverages in the [place]. It includes the serving size of that food or beverage and information about the nutrients in each serving of the food, including the grams (g) of carbohydrate per serving.  Decide the number of servings of this food or beverage that you  will be able to eat or drink. Multiply that number of servings by the number of grams of carbohydrate that is listed on the label for that serving. The total will be the amount of carbohydrates you will be having when you eat or drink this food or beverage. Learning Standard Serving Sizes of Food When you eat food  that is not packaged or does not include "Nutrition Facts" on the label, you need to measure the servings in order to count the amount of carbohydrates.A serving of most carbohydrate-rich foods contains about 15 g of carbohydrates. The following list includes serving sizes of carbohydrate-rich foods that provide 15 g ofcarbohydrate per serving:  1 slice of bread (1 oz) or 1 six-inch tortilla.   of a hamburger bun or English muffin. 4-6 crackers.  cup unsweetened dry cereal.   cup hot cereal.  cup rice or pasta.   cup mashed potatoes or  of a large baked potato. 1 cup fresh fruit or one small piece of fruit.   cup canned or frozen fruit or fruit juice. 1 cup milk.  cup plain fat-free yogurt or yogurt sweetened with artificial sweeteners.  cup cooked dried beans or starchy vegetable, such as peas, corn, or potatoes.  Decide the number of standard-size servings that you will eat. Multiply that number of servings by 15 (the grams of carbohydrates in that serving). For example, if you eat 2 cups of strawberries, you will have eaten 2 servings and 30 g of carbohydrates (2 servings x 15 g = 30 g). For foods such as soups and casseroles, in which more than one food is mixed in, you will need to count the carbohydrates in each food that is included. EXAMPLE OF CARBOHYDRATE COUNTING Sample Dinner 3 oz chicken breast.  cup of brown rice.  cup of corn. 1 cup milk.  1 cup strawberries with sugar-free whipped topping.  Carbohydrate Calculation Step 1: Identify the foods that contain carbohydrates:  Rice.  Corn.  Milk.  Strawberries. Step 2:Calculate the number of  servings eaten of each:  2 servings of rice.  1 serving of corn.  1 serving of milk.  1 serving of strawberries. Step 3: Multiply each of those number of servings by 15 g:  2 servings of rice x 15 g = 30 g.  1 serving of corn x 15 g = 15 g.  1 serving of milk x 15 g = 15 g.  1 serving of strawberries x 15 g = 15 g. Step 4: Add together all of the amounts to find the total grams of carbohydrates eaten: 30 g + 15 g + 15 g + 15 g = 75 g. Document Released: 06/29/2005 Document Revised: 11/13/2013 Document Reviewed: 05/26/2013 [facility] Patient Information 2015 [place], [place]. This information is not intended to replace advice given to you by your health care provider. Make sure you discuss any questions you have with your health care provider. DASH Eating Plan DASH stands for "Dietary Approaches to Stop Hypertension." The DASH eating plan is a healthy eating plan that has been shown to reduce high blood pressure (hypertension). Additional health benefits may include reducing the risk of type 2 diabetes mellitus, heart disease, and stroke. The DASH eating plan may also help with weight loss. WHAT DO I NEED TO KNOW ABOUT THE DASH EATING PLAN? For the DASH eating plan, you will follow these general guidelines:  Choose foods with a percent daily value for sodium of less than 5% (as listed on the food label).  Use salt-free seasonings or herbs instead of table salt or sea salt.  Check with your health care provider or pharmacist before using salt substitutes.  Eat lower-sodium products, often labeled as "lower sodium" or "no salt added."  Eat fresh foods.  Eat more vegetables, fruits, and low-fat dairy products.  Choose whole grains. Look for the  word "whole" as the first word in the ingredient list.  Choose fish and skinless chicken or [place] more often than red meat. Limit fish, poultry, and meat to 6 oz (170 g) each day.  Limit sweets, desserts, sugars, and sugary drinks.  Choose  heart-healthy fats.  Limit cheese to 1 oz (28 g) per day.  Eat more home-cooked food and less restaurant, buffet, and fast food.  Limit fried foods.  Cook foods using methods other than frying.  Limit canned vegetables. If you do use them, rinse them well to decrease the sodium.  When eating at a restaurant, ask that your food be prepared with less salt, or no salt if possible. WHAT FOODS CAN I EAT? Seek help from a dietitian for individual calorie needs. Grains Whole grain or whole wheat bread. Brown rice. Whole grain or whole wheat pasta. Quinoa, bulgur, and whole grain cereals. Low-sodium cereals. Corn or whole wheat flour tortillas. Whole grain cornbread. Whole grain crackers. Low-sodium crackers. Vegetables Fresh or frozen vegetables (raw, steamed, roasted, or grilled). Low-sodium or reduced-sodium tomato and vegetable juices. Low-sodium or reduced-sodium tomato sauce and paste. Low-sodium or reduced-sodium canned vegetables.  Fruits All fresh, canned (in natural juice), or frozen fruits. Meat and Other Protein Products Ground beef (85% or leaner), grass-fed beef, or beef trimmed of fat. Skinless chicken or [place]. Ground chicken or [place]. Pork trimmed of fat. All fish and seafood. Eggs. Dried beans, peas, or lentils. Unsalted nuts and seeds. Unsalted canned beans. Dairy Low-fat dairy products, such as skim or 1% milk, 2% or reduced-fat cheeses, low-fat ricotta or cottage cheese, or plain low-fat yogurt. Low-sodium or reduced-sodium cheeses. Fats and Oils Tub margarines without trans fats. Light or reduced-fat mayonnaise and salad dressings (reduced sodium). Avocado. Safflower, olive, or canola oils. Natural peanut or almond butter. Other Unsalted popcorn and pretzels. The items listed above may not be a complete list of recommended foods or beverages. Contact your dietitian for more options. WHAT FOODS ARE NOT RECOMMENDED? Grains White bread. White pasta. White rice. Refined  cornbread. Bagels and croissants. Crackers that contain trans fat. Vegetables Creamed or fried vegetables. Vegetables in a cheese sauce. Regular canned vegetables. Regular canned tomato sauce and paste. Regular tomato and vegetable juices. Fruits Dried fruits. Canned fruit in light or heavy syrup. Fruit juice. Meat and Other Protein Products Fatty cuts of meat. Ribs, chicken wings, bacon, sausage, bologna, salami, chitterlings, fatback, hot dogs, bratwurst, and packaged luncheon meats. Salted nuts and seeds. Canned beans with salt. Dairy Whole or 2% milk, cream, half-and-half, and cream cheese. Whole-fat or sweetened yogurt. Full-fat cheeses or blue cheese. Nondairy creamers and whipped toppings. Processed cheese, cheese spreads, or cheese curds. Condiments Onion and garlic salt, seasoned salt, table salt, and sea salt. Canned and packaged gravies. Worcestershire sauce. Tartar sauce. Barbecue sauce. Teriyaki sauce. Soy sauce, including reduced sodium. Steak sauce. Fish sauce. Oyster sauce. Cocktail sauce. Horseradish. Ketchup and mustard. Meat flavorings and tenderizers. Bouillon cubes. Hot sauce. Tabasco sauce. Marinades. Taco seasonings. Relishes. Fats and Oils Butter, stick margarine, lard, shortening, ghee, and bacon fat. Coconut, palm kernel, or palm oils. Regular salad dressings. Other Pickles and olives. Salted popcorn and pretzels. The items listed above may not be a complete list of foods and beverages to avoid. Contact your dietitian for more information. WHERE CAN I FIND MORE INFORMATION? National Heart, Lung, and Blood Institute: travelstabloid.com Document Released: 06/18/2011 Document Revised: 11/13/2013 Document Reviewed: 05/03/2013 [facility] Patient Information 2015 [place], [place]. This information is not intended to replace  advice given to you by your health care provider. Make sure you discuss any questions you have with your health care  provider. Cellulitis Cellulitis is an infection of the skin and the tissue under the skin. The infected area is usually red and tender. This happens most often in the arms and lower legs. HOME CARE   Take your antibiotic medicine as told. Finish the medicine even if you start to feel better.  Keep the infected arm or leg raised (elevated).  Put a warm cloth on the area up to 4 times per day.  Only take medicines as told by your doctor.  Keep all doctor visits as told. GET HELP IF:  You see red streaks on the skin coming from the infected area.  Your red area gets bigger or turns a dark color.  Your bone or joint under the infected area is painful after the skin heals.  Your infection comes back in the same area or different area.  You have a puffy (swollen) bump in the infected area.  You have new symptoms.  You have a fever. GET HELP RIGHT AWAY IF:   You feel very sleepy.  You throw up (vomit) or have watery poop (diarrhea).  You feel sick and have muscle aches and pains. MAKE SURE YOU:   Understand these instructions.  Will watch your condition.  Will get help right away if you are not doing well or get worse. Document Released: 12/16/2007 Document Revised: 11/13/2013 Document Reviewed: 09/14/2011 [facility] Patient Information 2015 [place], [place]. This information is not intended to replace advice given to you by your health care provider. Make sure you discuss any questions you have with your health care provider.

## 2014-12-24 NOTE — Progress Notes (Signed)
Patient presents for BP check, CBG and record review for T2DM Med list reviewed; patient reports taking all meds as directed Taking lantus 50 units q HS and 15 units novolog tid before meals Patient states she is trying to save on test strips so is not always checking before injecting novolog Instructed patient on importance of knowing BS before injecting novolog due to risk of hypoglycemia. S/sx of hypoglycemia and immediate actions to take discussed in detail Patient's AM fasting blood sugars ranging  109-291 Patient's before lunch blood sugars ranging 80-258 Patient's before dinner blood sugars ranging 67-250 Patient's before bed blood sugars ranging 147-306 States when BS was 67 she felt shaky, drank [facility] and felt better. Did not recheck BS Instructed to recheck BS 15 minutes after treating hypoglycemia to ensure BS > 70 and repeat treatment as necessary till BS > 70 Positive for increased thirst and urination; patient thinks due to 40 mg lasix bid States decreased energy X 2 months feels due to meds. Scheduled for sleep study tomorrow C/o numbness right hand middle digit X 2 months. States ran out of gabapentin 1 month ago, unsure of dose.  [facility] contacted for dose and refilled at previous dose (300 mg q HS) per Covering Provider C/o LE hardness and warmth X 2 weeks  CBG 174 5 + hours after meal  Lab Results  Component Value Date   HGBA1C 9.70 10/08/2014   Filed Vitals:   12/24/14 1700  BP: 130/60  Pulse: 66  Temp: 98.4 F (36.9 C)  Resp: 20  WT 238.4 lb  Lower extremities with erythema, hardness and warmth to touch.  Discussed elevating LE while in sitting position Discussed need for low sodium diet and using Mrs. Dash as alternative to salt Encouraged to choose foods with 5% or less of daily value for sodium.  Per Covering Provider: Increase lantus to 52 units q HS Keflex 500 mg tid X 7 days  Patient advised to call for med refills at least 7 days before  running out so as not to go without.  Patient to return in 2 weeks for nurse visit for BP check, CBG and record review or sooner with PCP if LE sx worsen or do not improve  Patient given literature on DASH Eating Plan, Diabetes and Food, Basic Carb Counting, Diabetes and Foot Care, Diabetic Neuropathy, Hypoglycemia, and Cellulitis

## 2014-12-25 ENCOUNTER — Encounter (HOSPITAL_BASED_OUTPATIENT_CLINIC_OR_DEPARTMENT_OTHER): Payer: Self-pay

## 2014-12-25 ENCOUNTER — Ambulatory Visit (HOSPITAL_BASED_OUTPATIENT_CLINIC_OR_DEPARTMENT_OTHER): Payer: Self-pay | Attending: Physician Assistant | Admitting: Radiology

## 2014-12-25 VITALS — Ht 68.0 in | Wt 276.0 lb

## 2014-12-25 DIAGNOSIS — G4719 Other hypersomnia: Secondary | ICD-10-CM

## 2014-12-25 DIAGNOSIS — I491 Atrial premature depolarization: Secondary | ICD-10-CM | POA: Insufficient documentation

## 2014-12-25 DIAGNOSIS — G4733 Obstructive sleep apnea (adult) (pediatric): Secondary | ICD-10-CM | POA: Insufficient documentation

## 2014-12-25 DIAGNOSIS — I5033 Acute on chronic diastolic (congestive) heart failure: Secondary | ICD-10-CM

## 2014-12-25 DIAGNOSIS — R0683 Snoring: Secondary | ICD-10-CM | POA: Insufficient documentation

## 2015-01-01 ENCOUNTER — Telehealth: Payer: Self-pay | Admitting: Cardiology

## 2015-01-01 DIAGNOSIS — G4733 Obstructive sleep apnea (adult) (pediatric): Secondary | ICD-10-CM | POA: Insufficient documentation

## 2015-01-01 DIAGNOSIS — G4719 Other hypersomnia: Secondary | ICD-10-CM | POA: Insufficient documentation

## 2015-01-01 NOTE — Telephone Encounter (Signed)
Please let patient know that they have significant sleep apnea and had successful CPAP titration and will be set up with CPAP unit.  Please let DME know that order is in EPIC.  Please set patient up for OV in 10 weeks 

## 2015-01-01 NOTE — Addendum Note (Signed)
Addended by: Sueanne Margarita on: 01/01/2015 08:54 PM   Modules accepted: Orders

## 2015-01-01 NOTE — Sleep Study (Signed)
NAME: [name] DATE OF BIRTH:  08-23-1955 MEDICAL RECORD NUMBER [id number]  LOCATION: [facility]  PHYSICIAN: TURNER,TRACI R  DATE OF STUDY: 12/25/2014  SLEEP STUDY TYPE: Nocturnal Polysomnogram               REFERRING PHYSICIAN: Brett Canales, PA-C  INDICATION FOR STUDY: Witness apnea, snoring, excessive daytime fatigue  EPWORTH SLEEPINESS SCORE: 12 HEIGHT: 5\' 8"  (172.7 cm)  WEIGHT: 276 lb (125.193 kg)    Body mass index is 41.98 kg/(m^2).  NECK SIZE: 15.5 in.  MEDICATIONS: Reviewed in the chart  SLEEP ARCHITECTURE: During the diagnostic portion of the study, the patient slept for a total of 171 minutes out of a total sleep period time of 180 minutes.  There was no slow wave sleep and 50 minutes of REM sleep.  The onset to sleep latency was 14 minutes.  The onset to REM sleep latency was short at 52 minutes.  The sleep efficiency was normal at 88%.  During the CPAP titration, the patient slept for a total of 190 minutes out of a total sleep period time of 192 minutes.  There was no slow wave sleep and 34 minutes of REM sleep.  The onset to sleep latency was 7 minutes.  The onset to REM sleep latency was 79 minutes.  The sleep efficiency was normal at 96%.    RESPIRATORY DATA: During the diagnostic portion of the study there was 1 obstructive apnea.  There were 50 hypopneas.  The overall AHI was 18 events per hour consistent with moderate obstructive sleep apnea/hypopnea syndrome.  Most events occurred during REM sleep in the supine position.  There was mild to moderate snoring. The patient was started on CPAP at 5cm H2O and titrated for respiratory events and snoring to 14cm H2O.  The patient was able to reach an optimum pressure of 11cm H2O without further significant respiratory events but unable to reach REM supine sleep.  The AHI at 11cm H2O was 3.2 events per hour.    OXYGEN DATA: During the diagnostic portion of the study, the average oxygen saturation was  92%.  The lowest oxygen saturation was 74%.  The time spent with oxygen saturations <88% was 8.6 minutes.  During the CPAP titration the lowest oxygen saturation on 11cm H2O was 92%.  CARDIAC DATA: The patient maintained NSR with PAC's and atrial triplet.  The average heart rate was 67-72 bpm.  The lowest heart rate was 31 bpm and the highest heart rate was 167 bpm.    MOVEMENT/PARASOMNIA: There were no periodic limb movement disorders and no REM sleep behavior disorders.  IMPRESSION/ RECOMMENDATION:   1.  Moderate obstructive sleep apnea/hypopnea syndrome with an AHI of 18 events per hour.   Most events occurred during REM sleep in the supine position. 2.  Mild to moderate snoring was noted. 3.  Abnormal sleep architecture with no slow wave sleep. 4.  Reduced sleep efficiency with increased frequency of spontaneous arousals that significantly improved on CPAP. 5.  Oxygen desaturations as low as 74% during the diagnostic portion.  During the CPAP titration the lowest oxygen saturation on 11cm H2O was 92%. 6.  NSR with PAC's and atrial triplet were noted. 7. The patient was able to reach an optimum pressure of 11cm H2O without further significant respiratory events but unable to reach REM supine sleep.  The AHI at 11cm H2O was 3.2 events per hour.   8.  Recommend ResMed CPAP with heated humidity,  C flex of 3 and small Resmed Airfit F-10 full face mask with CPAP set at 11cm H2O.   9. Treatment would also include careful attention to proper sleep hygiene, weight reduction for elevated BMI, avoidance of sleeping in the supine position and avoidance of alcohol within four hours of bedtime.  Specific treatment decisions should be tailored to each patient based upon the clinical situation and all treatment options should be considered.  The patient should be instructed to avoid driving if sleepy and careful clinical follow up is needed to ensure that the patient's symptoms are improving with therapy and the  PAP adherence is supported and measured if prescribed.    Signed: Sueanne Margarita Diplomate, American Board of Sleep Medicine  ELECTRONICALLY SIGNED ON:  01/01/2015, 8:37 PM [place] PH: [phone]   FX: [phone] [place]

## 2015-01-02 NOTE — Telephone Encounter (Signed)
Patient is aware of results. [place] notified that orders are in.  I will follow-up with patient in a week, and once she has her machine I will schedule her 10 week follow-up.

## 2015-01-04 ENCOUNTER — Telehealth: Payer: Self-pay | Admitting: *Deleted

## 2015-01-04 NOTE — Telephone Encounter (Signed)
Patient aware of results of renal U/S Patient aware of need to keep DM and HTN controlled Patient will f/u with this RN on 01/21/15 with BS log Patient will f/u with PCP around 02/09/15    Notes Recorded by Boykin Nearing, MD on 11/22/2014 at 3:13 PM Renal ultrasound with changes consistent with diabetes and HTN No stones or enlargement of kidneys. Plan is to control diabetes and HTN to prevent progression of kidney disease

## 2015-01-24 ENCOUNTER — Ambulatory Visit: Payer: Self-pay | Attending: Family Medicine | Admitting: *Deleted

## 2015-01-24 VITALS — BP 137/69 | HR 69 | Temp 98.2°F | Resp 18 | Ht 68.0 in | Wt 284.4 lb

## 2015-01-24 DIAGNOSIS — IMO0002 Reserved for concepts with insufficient information to code with codable children: Secondary | ICD-10-CM

## 2015-01-24 DIAGNOSIS — Z794 Long term (current) use of insulin: Secondary | ICD-10-CM | POA: Insufficient documentation

## 2015-01-24 DIAGNOSIS — A6000 Herpesviral infection of urogenital system, unspecified: Secondary | ICD-10-CM

## 2015-01-24 DIAGNOSIS — Z87891 Personal history of nicotine dependence: Secondary | ICD-10-CM | POA: Insufficient documentation

## 2015-01-24 DIAGNOSIS — E1165 Type 2 diabetes mellitus with hyperglycemia: Secondary | ICD-10-CM

## 2015-01-24 DIAGNOSIS — I10 Essential (primary) hypertension: Secondary | ICD-10-CM

## 2015-01-24 DIAGNOSIS — E118 Type 2 diabetes mellitus with unspecified complications: Secondary | ICD-10-CM

## 2015-01-24 LAB — POCT GLYCOSYLATED HEMOGLOBIN (HGB A1C): Hemoglobin A1C: 8.2

## 2015-01-24 LAB — GLUCOSE, POCT (MANUAL RESULT ENTRY): POC Glucose: 166 mg/dl — AB (ref 70–99)

## 2015-01-24 MED ORDER — INSULIN ASPART 100 UNIT/ML ~~LOC~~ SOLN: 17.0000 [IU] | Freq: Three times a day (TID) | SUBCUTANEOUS | Status: DC

## 2015-01-24 MED ORDER — VALACYCLOVIR HCL 500 MG PO TABS: 500.0000 mg | ORAL_TABLET | Freq: Every day | ORAL | Status: DC

## 2015-01-24 MED ORDER — ATORVASTATIN CALCIUM 20 MG PO TABS: 20.0000 mg | ORAL_TABLET | Freq: Every day | ORAL | Status: DC

## 2015-01-24 NOTE — Patient Instructions (Signed)
Diabetic Neuropathy Diabetic neuropathy is a nerve disease or nerve damage that is caused by diabetes mellitus. About half of all people with diabetes mellitus have some form of nerve damage. Nerve damage is more common in those who have had diabetes mellitus for many years and who generally have not had good control of their blood sugar (glucose) level. Diabetic neuropathy is a common complication of diabetes mellitus. There are three more common types of diabetic neuropathy and a fourth type that is less common and less understood:   Peripheral neuropathy--This is the most common type of diabetic neuropathy. It causes damage to the nerves of the feet and legs first and then eventually the hands and arms.The damage affects the ability to sense touch.  Autonomic neuropathy--This type causes damage to the autonomic nervous system, which controls the following functions:  Heartbeat.  Body temperature.  Blood pressure.  Urination.  Digestion.  Sweating.  Sexual function.  Focal neuropathy--Focal neuropathy can be painful and unpredictable and occurs most often in older adults with diabetes mellitus. It involves a specific nerve or one area and often comes on suddenly. It usually does not cause long-term problems.  Radiculoplexus neuropathy-- Sometimes called lumbosacral radiculoplexus neuropathy, radiculoplexus neuropathy affects the nerves of the thighs, hips, buttocks, or legs. It is more common in people with type 2 diabetes mellitus and in older men. It is characterized by debilitating pain, weakness, and atrophy, usually in the thigh muscles. CAUSES  The cause of peripheral, autonomic, and focal neuropathies is diabetes mellitus that is uncontrolled and high glucose levels. The cause of radiculoplexus neuropathy is unknown. However, it is thought to be caused by inflammation related to uncontrolled glucose levels. SIGNS AND SYMPTOMS  Peripheral Neuropathy Peripheral neuropathy develops  slowly over time. When the nerves of the feet and legs no longer work there may be:   Burning, stabbing, or aching pain in the legs or feet.  Inability to feel pressure or pain in your feet. This can lead to:  Thick calluses over pressure areas.  Pressure sores.  Ulcers.  Foot deformities.  Reduced ability to feel temperature changes.  Muscle weakness. Autonomic Neuropathy The symptoms of autonomic neuropathy vary depending on which nerves are affected. Symptoms may include:  Problems with digestion, such as:  Feeling sick to your stomach (nausea).  Vomiting.  Bloating.  Constipation.  Diarrhea.  Abdominal pain.  Difficulty with urination. This occurs if you lose your ability to sense when your bladder is full. Problems include:  Urine leakage (incontinence).  Inability to empty your bladder completely (retention).  Rapid or irregular heartbeat (palpitations).  Blood pressure drops when you stand up (orthostatic hypotension). When you stand up you may feel:  Dizzy.  Weak.  Faint.  In men, inability to attain and maintain an erection.  In women, vaginal dryness and problems with decreased sexual desire and arousal.  Problems with body temperature regulation.  Increased or decreased sweating. Focal Neuropathy  Abnormal eye movements or abnormal alignment of both eyes.  Weakness in the wrist.  Foot drop. This results in an inability to lift the foot properly and abnormal walking or foot movement.  Paralysis on one side of your face (Bell palsy).  Chest or abdominal pain. Radiculoplexus Neuropathy  Sudden, severe pain in your hip, thigh, or buttocks.  Weakness and wasting of thigh muscles.  Difficulty rising from a seated position.  Abdominal swelling.  Unexplained weight loss (usually more than 10 lb [4.5 kg]). DIAGNOSIS  Peripheral Neuropathy Your senses may   be tested. Sensory function testing can be done with:  A light touch using a  monofilament.  A vibration with tuning fork.  A sharp sensation with a pin prick. Other tests that can help diagnose neuropathy are:  Nerve conduction velocity. This test checks the transmission of an electrical current through a nerve.  Electromyography. This shows how muscles respond to electrical signals transmitted by nearby nerves.  Quantitative sensory testing. This is used to assess how your nerves respond to vibrations and changes in temperature. Autonomic Neuropathy Diagnosis is often based on reported symptoms. Tell your health care provider if you experience:   Dizziness.   Constipation.   Diarrhea.   Inappropriate urination or inability to urinate.   Inability to get or maintain an erection.  Tests that may be done include:   Electrocardiography or Holter monitor. These are tests that can help show problems with the heart rate or heart rhythm.   An X-[place] exam may be done. Focal Neuropathy Diagnosis is made based on your symptoms and what your health care provider finds during your exam. Other tests may be done. They may include:  Nerve conduction velocities. This checks the transmission of electrical current through a nerve.  Electromyography. This shows how muscles respond to electrical signals transmitted by nearby nerves.  Quantitative sensory testing. This test is used to assess how your nerves respond to vibration and changes in temperature. Radiculoplexus Neuropathy  Often the first thing is to eliminate any other issue or problems that might be the cause, as there is no stick test for diagnosis.  X-[place] exam of your spine and lumbar region.  Spinal tap to rule out cancer.  MRI to rule out other lesions. TREATMENT  Once nerve damage occurs, it cannot be reversed. The goal of treatment is to keep the disease or nerve damage from getting worse and affecting more nerve fibers. Controlling your blood glucose level is the key. Most people with  radiculoplexus neuropathy see at least a partial improvement over time. You will need to keep your blood glucose and HbA1c levels in the target range determined by your health care provider. Things that help control blood glucose levels include:   Blood glucose monitoring.   Meal planning.   Physical activity.   Diabetes medicine.  Over time, maintaining lower blood glucose levels helps lessen symptoms. Sometimes, prescription pain medicine is needed. HOME CARE INSTRUCTIONS:  Do not smoke.  Keep your blood glucose level in the range that you and your health care provider have determined acceptable for you.  Keep your blood pressure level in the range that you and your health care provider have determined acceptable for you.  Eat a well-balanced diet.  Be active every day.  Check your feet every day. SEEK MEDICAL CARE IF:   You have burning, stabbing, or aching pain in the legs or feet.  You are unable to feel pressure or pain in your feet.  You develop problems with digestion such as:  Nausea.  Vomiting.  Bloating.  Constipation.  Diarrhea.  Abdominal pain.  You have difficulty with urination, such as:  Incontinence.  Retention.  You have palpitations.  You develop orthostatic hypotension. When you stand up you may feel:  Dizzy.  Weak.  Faint.  You cannot attain and maintain an erection (in men).  You have vaginal dryness and problems with decreased sexual desire and arousal (in women).  You have severe pain in your thighs, legs, or buttocks.  You have unexplained weight loss.   Document Released: 09/07/2001 Document Revised: 04/19/2013 Document Reviewed: 12/08/2012 [employer] Patient Information 2015 [employer]. This information is not intended to replace advice given to you by your health care provider. Make sure you discuss any questions you have with your health care provider. 

## 2015-01-24 NOTE — Progress Notes (Signed)
Patient presents for BP check, CBG and record review for T2DM after increasing lantus to 52 units q HS Med list reviewed; patient reports taking all meds as directed except ran out of lipitor 2 weeks ago and usually takes valtrex 500 mg daily to prevent outbreaks Patient's AM fasting blood sugars ranging 124-245 Patient's before lunch blood sugars ranging 105-176 Patient's before dinner blood sugars ranging 88-169 Patient's before bed blood sugars ranging 121-235 (5 readings) States she was Dx with Hep C 2014 or 2015 and has not received treatment C/o left thumb and first 3 finger numbness X 3-4 weeks C/o left hand pain X 2 months; none at present C/o feeling tired, sluggish X 3 weeks Positive for increased thirst and urination (taking lasix 40 mg bid)  PHQ-9 score of 19 GAD score of 8; patient referred to LCSW   CBG 154 AM fasting per patient record.  CBG 2 hours after eating chicken and roasted potatoes   Hgb A1 8.2  Lab Results  Component Value Date   HGBA1C 9.70 10/08/2014   Filed Vitals:   01/24/15 1433  BP: 137/69  Pulse: 69  Temp: 98.2 F (36.8 C)  Resp: 18    Per PCP: Increase novolog insulin to 17 units tid AC Valtrex as patient takes F/u with PCP for other issues  Patient advised to call for med refills at least 7 days before running out so as not to go without.  Patient aware that she is to f/u with PCP 3 months from last visit. Due 02/07/2015 or sooner to discuss other issues  Patient given literature on Diabetic Neuropathy  Refill for lipitor placed

## 2015-01-25 ENCOUNTER — Telehealth: Payer: Self-pay | Admitting: Clinical

## 2015-01-25 NOTE — Telephone Encounter (Signed)
F/u w pt; she will consider making an appointment w [facility] at next visit w PCP

## 2015-01-31 ENCOUNTER — Encounter: Payer: Self-pay | Admitting: Family Medicine

## 2015-01-31 ENCOUNTER — Ambulatory Visit: Payer: Self-pay | Attending: Family Medicine | Admitting: Family Medicine

## 2015-01-31 VITALS — BP 110/66 | HR 61 | Temp 98.9°F | Resp 18 | Ht 68.0 in | Wt 282.0 lb

## 2015-01-31 DIAGNOSIS — IMO0002 Reserved for concepts with insufficient information to code with codable children: Secondary | ICD-10-CM

## 2015-01-31 DIAGNOSIS — E118 Type 2 diabetes mellitus with unspecified complications: Secondary | ICD-10-CM

## 2015-01-31 DIAGNOSIS — I5033 Acute on chronic diastolic (congestive) heart failure: Secondary | ICD-10-CM

## 2015-01-31 DIAGNOSIS — Z8619 Personal history of other infectious and parasitic diseases: Secondary | ICD-10-CM

## 2015-01-31 DIAGNOSIS — E119 Type 2 diabetes mellitus without complications: Secondary | ICD-10-CM

## 2015-01-31 DIAGNOSIS — M79641 Pain in right hand: Secondary | ICD-10-CM

## 2015-01-31 DIAGNOSIS — E1140 Type 2 diabetes mellitus with diabetic neuropathy, unspecified: Secondary | ICD-10-CM | POA: Insufficient documentation

## 2015-01-31 DIAGNOSIS — E1165 Type 2 diabetes mellitus with hyperglycemia: Secondary | ICD-10-CM

## 2015-01-31 DIAGNOSIS — I5032 Chronic diastolic (congestive) heart failure: Secondary | ICD-10-CM

## 2015-01-31 DIAGNOSIS — B182 Chronic viral hepatitis C: Secondary | ICD-10-CM | POA: Insufficient documentation

## 2015-01-31 DIAGNOSIS — E1142 Type 2 diabetes mellitus with diabetic polyneuropathy: Secondary | ICD-10-CM

## 2015-01-31 LAB — COMPLETE METABOLIC PANEL WITH GFR
ALT: 28 U/L (ref 0–35)
AST: 26 U/L (ref 0–37)
Albumin: 3.5 g/dL (ref 3.5–5.2)
Alkaline Phosphatase: 127 U/L — ABNORMAL HIGH (ref 39–117)
BUN: 50 mg/dL — ABNORMAL HIGH (ref 6–23)
CO2: 21 mEq/L (ref 19–32)
Calcium: 8.9 mg/dL (ref 8.4–10.5)
Chloride: 104 mEq/L (ref 96–112)
Creat: 1.46 mg/dL — ABNORMAL HIGH (ref 0.50–1.10)
GFR, Est African American: 46 mL/min — ABNORMAL LOW
GFR, Est Non African American: 40 mL/min — ABNORMAL LOW
Glucose, Bld: 150 mg/dL — ABNORMAL HIGH (ref 70–99)
Potassium: 4.1 mEq/L (ref 3.5–5.3)
Sodium: 139 mEq/L (ref 135–145)
Total Bilirubin: 0.3 mg/dL (ref 0.2–1.2)
Total Protein: 7.2 g/dL (ref 6.0–8.3)

## 2015-01-31 LAB — GLUCOSE, POCT (MANUAL RESULT ENTRY): POC Glucose: 167 mg/dL — AB (ref 70–99)

## 2015-01-31 MED ORDER — CARVEDILOL 25 MG PO TABS: 25.0000 mg | ORAL_TABLET | Freq: Two times a day (BID) | ORAL | Status: DC

## 2015-01-31 MED ORDER — FUROSEMIDE 40 MG PO TABS: 40.0000 mg | ORAL_TABLET | Freq: Two times a day (BID) | ORAL | Status: DC

## 2015-01-31 MED ORDER — GABAPENTIN 300 MG PO CAPS: 300.0000 mg | ORAL_CAPSULE | Freq: Every day | ORAL | Status: DC

## 2015-01-31 MED ORDER — INSULIN ASPART 100 UNIT/ML ~~LOC~~ SOLN: 12.0000 [IU] | Freq: Three times a day (TID) | SUBCUTANEOUS | Status: DC

## 2015-01-31 MED ORDER — INSULIN GLARGINE 100 UNIT/ML SOLOSTAR PEN: 42.0000 [IU] | PEN_INJECTOR | Freq: Every day | SUBCUTANEOUS | Status: DC

## 2015-01-31 MED ORDER — AMLODIPINE BESYLATE 10 MG PO TABS: 10.0000 mg | ORAL_TABLET | Freq: Every day | ORAL | Status: DC

## 2015-01-31 MED ORDER — ASPIRIN 325 MG PO TABS: 325.0000 mg | ORAL_TABLET | Freq: Every day | ORAL | Status: DC

## 2015-01-31 NOTE — Progress Notes (Signed)
   Subjective:    Patient ID: [name], female    DOB: 12-03-1955, 59 y.o.   MRN: [id number] CC; f/u DM2, numbness and pain in hand  HPI  1. CHRONIC DIABETES  Disease Monitoring  Blood Sugar Ranges: 66-210   Polyuria: no   Visual problems: no   Medication Compliance: yes  Medication Side Effects  Hypoglycemia: yes   2. Leg swelling: taking lasix BID. Exercising. Eating a low salt diet.   3. R hand pain: x 3 months. Cramping, numbness and burning. Taking gabapentin 300 mg nightly prn. No neck or shoulder pain. No injury or swelling.   4. Hep C: dx in 2014. No treatment. No abdominal pain, fever, nausea or emesis. No hx of IVDU, ETOH abuse, tattoos or partners with known Hep C.   Soc Hx: non smoker  Review of Systems  Constitutional: Negative for fever and chills.  Respiratory: Negative for shortness of breath.   Cardiovascular: Positive for leg swelling. Negative for chest pain.  Gastrointestinal: Negative for abdominal pain and blood in stool.  Skin: Negative for rash.  Neurological: Positive for numbness.  Psychiatric/Behavioral: Negative for suicidal ideas and dysphoric mood.    GAD-7: score of 8. 1-2,5. 2-3,4,5.     Objective:   Physical Exam BP 110/66 mmHg  Pulse 61  Temp(Src) 98.9 F (37.2 C) (Oral)  Resp 18  Ht 5\' 8"  (1.727 m)  Wt 282 lb (127.914 kg)  BMI 42.89 kg/m2  SpO2 97%  Wt Readings from Last 3 Encounters:  01/31/15 282 lb (127.914 kg)  01/24/15 284 lb 6.4 oz (129.003 kg)  12/25/14 276 lb (125.193 kg)  General appearance: alert, cooperative, no distress and moderately obese Lungs: clear to auscultation bilaterally Heart: regular rate and rhythm, S1, S2 normal, no murmur, click, rub or gallop Extremities: edema trace b/l LE   Lab Results  Component Value Date   HGBA1C 8.20 01/24/2015   CBG 167     Assessment & Plan:

## 2015-01-31 NOTE — Assessment & Plan Note (Addendum)
A: hep C positive in 2014 dx in [place], no treatment. No history of IVDU, no tattoos. Unsure if contracted during intercourse. No known partners with Hep C.  P: Hep C with reflex CMP  Plan for ID clinic referral if hep C viral load is elevated   Hep C positive with high viral load, normal liver transaminase.  ID referral placed

## 2015-01-31 NOTE — Assessment & Plan Note (Signed)
Diabetic neuropathy: Increase gabapentin to 300 mg at night for one week, then 600 mg at night

## 2015-01-31 NOTE — Progress Notes (Signed)
F/U DM Complaining of numbness and pain on hand x 3 month  No Hx tobacco

## 2015-01-31 NOTE — Patient Instructions (Addendum)
[name],  Thank you for coming in today  1. Diabetes: Improving with A1c down Low sugars in setting of increased exercise Decrease novolog to 12 U with meals Decrease lantus to 42 U at night  Diabetes blood sugar goals  Fasting (in AM before breakfast, 8 hrs of no eating or drinking (except water or unsweetened coffee or tea): 90-110 2 hrs after meals: < 160,   No low sugars: nothing < 70   Call if sugars are still below 70 at times  2. Diabetic neuropathy: Increase gabapentin to 300 mg at night for one week, then 600 mg at night   3. Hep C: Checking level today. If high will refer to  ID clinic   4. Leg swelling: Weigh daily  Continue low salt Continue lasix, your weight is up 6 lbs from last month, so take lasix 3 times a day for next 3 days  Elevation Add compression stockings, look for 30 mm Hg pressure  F/u in 6 weeks for neuropathy  Dr. Adrian Blackwater

## 2015-01-31 NOTE — Assessment & Plan Note (Signed)
Diabetes: Improving with A1c down Low sugars in setting of increased exercise Decrease novolog to 12 U with meals Decrease lantus to 42 U at night  Diabetes blood sugar goals  Fasting (in AM before breakfast, 8 hrs of no eating or drinking (except water or unsweetened coffee or tea): 90-110 2 hrs after meals: < 160,   No low sugars: nothing < 70   Call if sugars are still below 70 at times

## 2015-01-31 NOTE — Assessment & Plan Note (Addendum)
A: Leg swelling in setting of diastolic CHF. No evidence of acute exacerbation   P: Weigh daily  Continue low salt Continue lasix, your weight is up 6 lbs from last month, so take lasix 3 times a day for next 3 days  Elevation Add compression stockings, look for 30 mm Hg pressure

## 2015-02-01 ENCOUNTER — Telehealth: Payer: Self-pay | Admitting: Family Medicine

## 2015-02-01 DIAGNOSIS — M79641 Pain in right hand: Secondary | ICD-10-CM

## 2015-02-01 LAB — HEPATITIS C ANTIBODY: HCV Ab: REACTIVE — AB

## 2015-02-01 NOTE — Telephone Encounter (Signed)
Patient called requesting note for gym instructor for patient to be able to work out. Please f/u with patient

## 2015-02-04 LAB — HEPATITIS C RNA QUANTITATIVE
HCV Quantitative Log: 6.07 {Log} — ABNORMAL HIGH (ref <1.18)
HCV Quantitative: 1169167 IU/mL — ABNORMAL HIGH (ref <15)

## 2015-02-04 NOTE — Telephone Encounter (Signed)
Please call patient, letter ready for pick up

## 2015-02-04 NOTE — Assessment & Plan Note (Signed)
A: hand pain in setting of diabetic neuropathy P: Increase gabapentin to 300 mg at night for one week, then 600 mg at night

## 2015-02-05 NOTE — Addendum Note (Signed)
Addended by: Boykin Nearing on: 02/05/2015 09:15 AM   Modules accepted: Orders

## 2015-02-05 NOTE — Telephone Encounter (Signed)
Pt aware.

## 2015-02-11 ENCOUNTER — Other Ambulatory Visit: Payer: Self-pay | Admitting: Physician Assistant

## 2015-02-11 ENCOUNTER — Telehealth: Payer: Self-pay | Admitting: *Deleted

## 2015-02-11 NOTE — Telephone Encounter (Signed)
REFILL 

## 2015-02-11 NOTE — Telephone Encounter (Signed)
-----   Message from Boykin Nearing, MD sent at 02/05/2015  9:14 AM EDT ----- Hep C positive with high viral load, normal liver transaminase.  ID referral placed

## 2015-02-11 NOTE — Telephone Encounter (Signed)
Pt aware of results Notified Referra placed

## 2015-02-15 ENCOUNTER — Observation Stay (HOSPITAL_COMMUNITY)
Admission: EM | Admit: 2015-02-15 | Discharge: 2015-02-18 | Disposition: A | Discharge status: Home / Self Care | Payer: Self-pay | Attending: Internal Medicine | Admitting: Internal Medicine

## 2015-02-15 ENCOUNTER — Encounter (HOSPITAL_COMMUNITY): Payer: Self-pay | Admitting: *Deleted

## 2015-02-15 ENCOUNTER — Emergency Department (HOSPITAL_COMMUNITY): Payer: Self-pay

## 2015-02-15 DIAGNOSIS — G4733 Obstructive sleep apnea (adult) (pediatric): Secondary | ICD-10-CM | POA: Insufficient documentation

## 2015-02-15 DIAGNOSIS — N183 Chronic kidney disease, stage 3 (moderate): Secondary | ICD-10-CM | POA: Insufficient documentation

## 2015-02-15 DIAGNOSIS — Z87891 Personal history of nicotine dependence: Secondary | ICD-10-CM | POA: Insufficient documentation

## 2015-02-15 DIAGNOSIS — E1122 Type 2 diabetes mellitus with diabetic chronic kidney disease: Secondary | ICD-10-CM | POA: Insufficient documentation

## 2015-02-15 DIAGNOSIS — R0602 Shortness of breath: Secondary | ICD-10-CM | POA: Diagnosis present

## 2015-02-15 DIAGNOSIS — R0902 Hypoxemia: Secondary | ICD-10-CM

## 2015-02-15 DIAGNOSIS — J9601 Acute respiratory failure with hypoxia: Secondary | ICD-10-CM | POA: Diagnosis present

## 2015-02-15 DIAGNOSIS — R0600 Dyspnea, unspecified: Secondary | ICD-10-CM

## 2015-02-15 DIAGNOSIS — B1920 Unspecified viral hepatitis C without hepatic coma: Secondary | ICD-10-CM | POA: Insufficient documentation

## 2015-02-15 DIAGNOSIS — J189 Pneumonia, unspecified organism: Principal | ICD-10-CM | POA: Diagnosis present

## 2015-02-15 DIAGNOSIS — R079 Chest pain, unspecified: Secondary | ICD-10-CM | POA: Diagnosis present

## 2015-02-15 DIAGNOSIS — K3184 Gastroparesis: Secondary | ICD-10-CM | POA: Insufficient documentation

## 2015-02-15 DIAGNOSIS — N179 Acute kidney failure, unspecified: Secondary | ICD-10-CM

## 2015-02-15 DIAGNOSIS — Z794 Long term (current) use of insulin: Secondary | ICD-10-CM | POA: Insufficient documentation

## 2015-02-15 DIAGNOSIS — E785 Hyperlipidemia, unspecified: Secondary | ICD-10-CM | POA: Insufficient documentation

## 2015-02-15 DIAGNOSIS — N184 Chronic kidney disease, stage 4 (severe): Secondary | ICD-10-CM | POA: Diagnosis present

## 2015-02-15 DIAGNOSIS — I5032 Chronic diastolic (congestive) heart failure: Secondary | ICD-10-CM | POA: Insufficient documentation

## 2015-02-15 DIAGNOSIS — I129 Hypertensive chronic kidney disease with stage 1 through stage 4 chronic kidney disease, or unspecified chronic kidney disease: Secondary | ICD-10-CM | POA: Insufficient documentation

## 2015-02-15 LAB — BASIC METABOLIC PANEL
Anion gap: 12 (ref 5–15)
BUN: 42 mg/dL — ABNORMAL HIGH (ref 6–20)
CO2: 21 mmol/L — ABNORMAL LOW (ref 22–32)
Calcium: 8.8 mg/dL — ABNORMAL LOW (ref 8.9–10.3)
Chloride: 106 mmol/L (ref 101–111)
Creatinine, Ser: 1.47 mg/dL — ABNORMAL HIGH (ref 0.44–1.00)
GFR calc Af Amer: 45 mL/min — ABNORMAL LOW (ref >60)
GFR calc non Af Amer: 39 mL/min — ABNORMAL LOW (ref >60)
Glucose, Bld: 185 mg/dL — ABNORMAL HIGH (ref 65–99)
Potassium: 3.9 mmol/L (ref 3.5–5.1)
Sodium: 139 mmol/L (ref 135–145)

## 2015-02-15 LAB — BRAIN NATRIURETIC PEPTIDE: B Natriuretic Peptide: 77.6 pg/mL (ref 0.0–100.0)

## 2015-02-15 LAB — TROPONIN I
Troponin I: <0.03 ng/mL (ref <0.031)
Troponin I: <0.03 ng/mL (ref <0.031)

## 2015-02-15 LAB — CBC
HCT: 32.6 % — ABNORMAL LOW (ref 36.0–46.0)
Hemoglobin: 10.3 g/dL — ABNORMAL LOW (ref 12.0–15.0)
MCH: 26.5 pg (ref 26.0–34.0)
MCHC: 31.6 g/dL (ref 30.0–36.0)
MCV: 84 fL (ref 78.0–100.0)
Platelets: 245 10*3/uL (ref 150–400)
RBC: 3.88 MIL/uL (ref 3.87–5.11)
RDW: 14.9 % (ref 11.5–15.5)
WBC: 8.9 10*3/uL (ref 4.0–10.5)

## 2015-02-15 LAB — I-STAT TROPONIN, ED: Troponin i, poc: 0 ng/mL (ref 0.00–0.08)

## 2015-02-15 LAB — GLUCOSE, CAPILLARY
Glucose-Capillary: 151 mg/dL — ABNORMAL HIGH (ref 65–99)
Glucose-Capillary: 245 mg/dL — ABNORMAL HIGH (ref 65–99)

## 2015-02-15 MED ORDER — DEXTROSE 5 % IV SOLN: 500.0000 mg | Freq: Once | INTRAVENOUS | Status: DC

## 2015-02-15 MED ORDER — SODIUM CHLORIDE 0.9 % IJ SOLN
3.0000 mL | Freq: Two times a day (BID) | INTRAMUSCULAR | Status: DC
Start: 2015-02-15 — End: 2015-02-18
  Administered 2015-02-15 – 2015-02-17 (×4): 3 mL via INTRAVENOUS

## 2015-02-15 MED ORDER — ACETAMINOPHEN 500 MG PO TABS
1000.0000 mg | ORAL_TABLET | Freq: Once | ORAL | Status: AC
  Administered 2015-02-15: 1000 mg via ORAL
  Filled 2015-02-15: qty 2

## 2015-02-15 MED ORDER — FAMOTIDINE 20 MG PO TABS
20.0000 mg | ORAL_TABLET | Freq: Two times a day (BID) | ORAL | Status: DC
  Administered 2015-02-15 – 2015-02-18 (×6): 20 mg via ORAL
  Filled 2015-02-15 (×7): qty 1

## 2015-02-15 MED ORDER — INSULIN ASPART 100 UNIT/ML ~~LOC~~ SOLN
0.0000 [IU] | Freq: Every day | SUBCUTANEOUS | Status: DC
  Administered 2015-02-15 – 2015-02-17 (×3): 2 [IU] via SUBCUTANEOUS

## 2015-02-15 MED ORDER — GABAPENTIN 300 MG PO CAPS
600.0000 mg | ORAL_CAPSULE | Freq: Every day | ORAL | Status: DC
  Administered 2015-02-15 – 2015-02-17 (×3): 600 mg via ORAL
  Filled 2015-02-15 (×4): qty 2

## 2015-02-15 MED ORDER — INSULIN GLARGINE 100 UNIT/ML ~~LOC~~ SOLN
20.0000 [IU] | Freq: Every day | SUBCUTANEOUS | Status: DC
  Administered 2015-02-15 – 2015-02-17 (×3): 20 [IU] via SUBCUTANEOUS
  Filled 2015-02-15 (×4): qty 0.2

## 2015-02-15 MED ORDER — ACETAMINOPHEN 650 MG RE SUPP: 650.0000 mg | Freq: Four times a day (QID) | RECTAL | Status: DC | PRN

## 2015-02-15 MED ORDER — ACETAMINOPHEN 325 MG PO TABS
650.0000 mg | ORAL_TABLET | Freq: Four times a day (QID) | ORAL | Status: DC | PRN
Start: 2015-02-15 — End: 2015-02-18
  Administered 2015-02-16: 650 mg via ORAL
  Filled 2015-02-15: qty 2

## 2015-02-15 MED ORDER — POLYETHYLENE GLYCOL 3350 17 G PO PACK
17.0000 g | PACK | Freq: Every day | ORAL | Status: DC | PRN
  Filled 2015-02-15: qty 1

## 2015-02-15 MED ORDER — AMLODIPINE BESYLATE 10 MG PO TABS
10.0000 mg | ORAL_TABLET | Freq: Every day | ORAL | Status: DC
  Administered 2015-02-15 – 2015-02-18 (×4): 10 mg via ORAL
  Filled 2015-02-15 (×4): qty 1

## 2015-02-15 MED ORDER — CARVEDILOL 25 MG PO TABS
25.0000 mg | ORAL_TABLET | Freq: Two times a day (BID) | ORAL | Status: DC
  Administered 2015-02-15 – 2015-02-18 (×6): 25 mg via ORAL
  Filled 2015-02-15 (×8): qty 1

## 2015-02-15 MED ORDER — ALBUTEROL SULFATE (2.5 MG/3ML) 0.083% IN NEBU
5.0000 mg | INHALATION_SOLUTION | Freq: Once | RESPIRATORY_TRACT | Status: AC
  Administered 2015-02-15: 5 mg via RESPIRATORY_TRACT
  Filled 2015-02-15: qty 6

## 2015-02-15 MED ORDER — FUROSEMIDE 40 MG PO TABS
40.0000 mg | ORAL_TABLET | Freq: Two times a day (BID) | ORAL | Status: DC
  Administered 2015-02-15 – 2015-02-16 (×2): 40 mg via ORAL
  Filled 2015-02-15 (×4): qty 1

## 2015-02-15 MED ORDER — IPRATROPIUM BROMIDE 0.02 % IN SOLN
0.5000 mg | Freq: Once | RESPIRATORY_TRACT | Status: AC
  Administered 2015-02-15: 0.5 mg via RESPIRATORY_TRACT
  Filled 2015-02-15: qty 2.5

## 2015-02-15 MED ORDER — LISINOPRIL 40 MG PO TABS
40.0000 mg | ORAL_TABLET | Freq: Every day | ORAL | Status: DC
Start: 2015-02-15 — End: 2015-02-16
  Administered 2015-02-15 – 2015-02-16 (×2): 40 mg via ORAL
  Filled 2015-02-15 (×2): qty 1

## 2015-02-15 MED ORDER — DEXTROSE 5 % IV SOLN
1.0000 g | Freq: Once | INTRAVENOUS | Status: AC
  Administered 2015-02-15: 1 g via INTRAVENOUS
  Filled 2015-02-15: qty 10

## 2015-02-15 MED ORDER — INSULIN ASPART 100 UNIT/ML ~~LOC~~ SOLN
0.0000 [IU] | Freq: Three times a day (TID) | SUBCUTANEOUS | Status: DC
  Administered 2015-02-15 – 2015-02-18 (×9): 2 [IU] via SUBCUTANEOUS

## 2015-02-15 MED ORDER — ASPIRIN EC 325 MG PO TBEC
325.0000 mg | DELAYED_RELEASE_TABLET | Freq: Every day | ORAL | Status: DC
  Administered 2015-02-15 – 2015-02-18 (×4): 325 mg via ORAL
  Filled 2015-02-15 (×4): qty 1

## 2015-02-15 MED ORDER — HYDRALAZINE HCL 10 MG PO TABS
10.0000 mg | ORAL_TABLET | Freq: Two times a day (BID) | ORAL | Status: DC
  Administered 2015-02-15 – 2015-02-18 (×6): 10 mg via ORAL
  Filled 2015-02-15 (×7): qty 1

## 2015-02-15 MED ORDER — ATORVASTATIN CALCIUM 20 MG PO TABS
20.0000 mg | ORAL_TABLET | Freq: Every day | ORAL | Status: DC
  Administered 2015-02-15 – 2015-02-18 (×4): 20 mg via ORAL
  Filled 2015-02-15 (×4): qty 1

## 2015-02-15 MED ORDER — VALACYCLOVIR HCL 500 MG PO TABS
500.0000 mg | ORAL_TABLET | Freq: Every day | ORAL | Status: DC
  Administered 2015-02-15 – 2015-02-18 (×4): 500 mg via ORAL
  Filled 2015-02-15 (×4): qty 1

## 2015-02-15 MED ORDER — ENOXAPARIN SODIUM 60 MG/0.6ML ~~LOC~~ SOLN
60.0000 mg | SUBCUTANEOUS | Status: DC
  Administered 2015-02-15 – 2015-02-17 (×3): 60 mg via SUBCUTANEOUS
  Filled 2015-02-15 (×4): qty 0.6

## 2015-02-15 MED ORDER — METOCLOPRAMIDE HCL 10 MG PO TABS
10.0000 mg | ORAL_TABLET | Freq: Three times a day (TID) | ORAL | Status: DC
  Administered 2015-02-16 – 2015-02-18 (×7): 10 mg via ORAL
  Filled 2015-02-15 (×10): qty 1

## 2015-02-15 MED ORDER — ASPIRIN 81 MG PO CHEW
81.0000 mg | CHEWABLE_TABLET | Freq: Once | ORAL | Status: AC
  Administered 2015-02-15: 81 mg via ORAL
  Filled 2015-02-15: qty 1

## 2015-02-15 MED ORDER — FERROUS SULFATE 325 (65 FE) MG PO TABS
325.0000 mg | ORAL_TABLET | Freq: Two times a day (BID) | ORAL | Status: DC
  Administered 2015-02-15 – 2015-02-18 (×6): 325 mg via ORAL
  Filled 2015-02-15 (×8): qty 1

## 2015-02-15 MED ORDER — NITROGLYCERIN 0.4 MG SL SUBL: 0.4000 mg | SUBLINGUAL_TABLET | SUBLINGUAL | Status: DC | PRN

## 2015-02-15 MED ORDER — ALBUTEROL SULFATE (2.5 MG/3ML) 0.083% IN NEBU: 5.0000 mg | INHALATION_SOLUTION | Freq: Four times a day (QID) | RESPIRATORY_TRACT | Status: DC | PRN

## 2015-02-15 NOTE — ED Notes (Signed)
Pt in from home c/o SOB onset last night & L sided CP that radiates into upper L arm onset early this am, pt reports worsening SOB with movement, pt c/o nausea, denies v/d, pt c/o being diaphoretic with onset of symptoms, pt O2 sat <90% on RA, pt placed on 2 L  in ED, A&O x4, hx of CHF

## 2015-02-15 NOTE — ED Notes (Signed)
Phlebotomy at bedside drawing cultures. 

## 2015-02-15 NOTE — Progress Notes (Signed)
Pt is a new admit from ED to room 2W08, alert and oriented x4, oriented to the unit, currently denies pain, will continue to monitor pt.

## 2015-02-15 NOTE — ED Provider Notes (Signed)
CSN: [id number]     Arrival date & time 02/15/15  4098 History   First MD Initiated Contact with Patient 02/15/15 [phone]     Chief Complaint  Patient presents with  . Shortness of Breath  . Chest Pain     (Consider location/radiation/quality/duration/timing/severity/associated sxs/prior Treatment) HPI Comments: Patient with past medical history of CHF, diabetes, hypertension, presents to the emergency department with chief complaint of shortness of breath and chest pain. Patient states the symptoms worsened last night.  She reports having "a tremendous amount of coughing" last night. She complains of intermittent associated chest pain on the left side. She states that the shortness of breath is persistent, but worsens with activity and exertion. She notes that she has had increased swelling to her lower extremities. She has not tried taking anything for her symptoms today. There are no alleviating factors. Oxygen saturation noted to be 80% on arrival.  She denies any associated fevers or chills.  The history is provided by the patient. No language interpreter was used.    Past Medical History  Diagnosis Date  . CHF (congestive heart failure) 2011  . Bronchitis   . Diabetes mellitus Dx 1989  . Hypertension Dx 1989  . Ulcer 2010  . Hepatitis C Dx 2013  . Pancreatitis 2013  . Complication of anesthesia   . Refusal of blood transfusions as patient is Jehovah's Witness   . Asthma     As a child    Past Surgical History  Procedure Laterality Date  . Tubal ligation    . Cholecystectomy    . Knee arthroscopy    . Tubal ligation    . Eye surgery     Family History  Problem Relation Age of Onset  . Colon cancer Mother   . Heart attack Other   . Heart attack Maternal Grandmother   . Hypertension Sister   . Hypertension Brother   . Diabetes Paternal Grandmother    History  Substance Use Topics  . Smoking status: Former Smoker    Quit date: 10/25/1980  . Smokeless tobacco: Never  Used     Comment: quit smoking in 1982  . Alcohol Use: Yes     Comment: ocassionally   OB History    No data available     Review of Systems  Constitutional: Negative for fever and chills.  Respiratory: Positive for cough, shortness of breath and wheezing.   Cardiovascular: Positive for chest pain and leg swelling.  Gastrointestinal: Negative for nausea, vomiting, diarrhea and constipation.  Genitourinary: Negative for dysuria.  All other systems reviewed and are negative.     Allergies  Morphine and related and Shellfish allergy  Home Medications   Prior to Admission medications   Medication Sig Start Date End Date Taking? Authorizing Provider  albuterol (PROAIR HFA) 108 (90 BASE) MCG/ACT inhaler Inhale 2 puffs into the lungs every 4 (four) hours as needed for wheezing. 12/05/14  Yes Tresa Garter, MD  amLODipine (NORVASC) 10 MG tablet Take 1 tablet (10 mg total) by mouth daily. 01/31/15  Yes Boykin Nearing, MD  aspirin 325 MG tablet Take 1 tablet (325 mg total) by mouth daily. 01/31/15  Yes Josalyn Funches, MD  atorvastatin (LIPITOR) 20 MG tablet Take 1 tablet (20 mg total) by mouth daily. 01/24/15  Yes Josalyn Funches, MD  Blood Glucose Monitoring Suppl (TRUE METRIX METER) W/DEVICE KIT 1 each by Does not apply route as needed. 11/08/14  Yes Josalyn Funches, MD  carvedilol (COREG) 25 MG  tablet Take 1 tablet (25 mg total) by mouth 2 (two) times daily with a meal. 01/31/15  Yes Josalyn Funches, MD  ferrous sulfate 325 (65 FE) MG tablet Take 1 tablet (325 mg total) by mouth 2 (two) times daily with a meal. 11/08/14  Yes Josalyn Funches, MD  furosemide (LASIX) 40 MG tablet Take 1 tablet (40 mg total) by mouth 2 (two) times daily. 8 AM and 2 PM 01/31/15  Yes Josalyn Funches, MD  gabapentin (NEURONTIN) 300 MG capsule Take 1-2 capsules (300-600 mg total) by mouth at bedtime. 01/31/15  Yes Josalyn Funches, MD  glucose blood (TRUE METRIX BLOOD GLUCOSE TEST) test strip 1 each by Other route  3 (three) times daily. 11/08/14  Yes Josalyn Funches, MD  hydrALAZINE (APRESOLINE) 10 MG tablet Take 1 tablet (10 mg total) by mouth 2 (two) times daily. NEED OV. 02/11/15  Yes Brett Canales, PA-C  insulin aspart (NOVOLOG) 100 UNIT/ML injection Inject 12 Units into the skin 3 (three) times daily before meals. 01/31/15  Yes Josalyn Funches, MD  Insulin Glargine (LANTUS SOLOSTAR) 100 UNIT/ML Solostar Pen Inject 42 Units into the skin daily at 10 pm. 01/31/15  Yes Josalyn Funches, MD  lisinopril (PRINIVIL,ZESTRIL) 40 MG tablet Take 1 tablet (40 mg total) by mouth daily. 10/26/14  Yes Arnoldo Morale, MD  metoCLOPramide (REGLAN) 10 MG tablet Take 1 tablet (10 mg total) by mouth 3 (three) times daily before meals. 11/08/14  Yes Josalyn Funches, MD  potassium chloride (K-DUR) 10 MEQ tablet Take 1 tablet (10 mEq total) by mouth daily. Patient taking differently: Take 10 mEq by mouth as needed. TAKE WITH LASIX 20 MG AS NEEDED 10/11/14  Yes Brett Canales, PA-C  ranitidine (ZANTAC) 150 MG tablet Take 1 tablet (150 mg total) by mouth 2 (two) times daily. 12/24/14  Yes Josalyn Funches, MD  valACYclovir (VALTREX) 500 MG tablet Take 1 tablet (500 mg total) by mouth daily. Patient taking differently: Take 500 mg by mouth every other day.  01/24/15  Yes Josalyn Funches, MD   BP 140/57 mmHg  Pulse 75  Temp(Src) 98.7 F (37.1 C) (Oral)  Resp 15  Ht 5' 8" (1.727 m)  Wt 282 lb (127.914 kg)  BMI 42.89 kg/m2  SpO2 89% Physical Exam  Constitutional: She is oriented to person, place, and time. She appears well-developed and well-nourished.  HENT:  Head: Normocephalic and atraumatic.  Eyes: Conjunctivae and EOM are normal. Pupils are equal, round, and reactive to light.  Neck: Normal range of motion. Neck supple.  Cardiovascular: Normal rate and regular rhythm.  Exam reveals no gallop and no friction rub.   No murmur heard. Pulmonary/Chest: Effort normal. No respiratory distress. She has wheezes. She has no rales. She  exhibits no tenderness.  Mild scattered end expiratory wheezes  Abdominal: Soft. Bowel sounds are normal. She exhibits no distension and no mass. There is no tenderness. There is no rebound and no guarding.  Musculoskeletal: Normal range of motion. She exhibits edema. She exhibits no tenderness.  2+ pitting edema to bilateral lower extremities  Neurological: She is alert and oriented to person, place, and time.  Skin: Skin is warm and dry.  Psychiatric: She has a normal mood and affect. Her behavior is normal. Judgment and thought content normal.  Nursing note and vitals reviewed.   ED Course  Procedures (including critical care time) Results for orders placed or performed during the hospital encounter of 83/09/40  Basic metabolic panel  Result Value Ref Range  Sodium 139 135 - 145 mmol/L   Potassium 3.9 3.5 - 5.1 mmol/L   Chloride 106 101 - 111 mmol/L   CO2 21 (L) 22 - 32 mmol/L   Glucose, Bld 185 (H) 65 - 99 mg/dL   BUN 42 (H) 6 - 20 mg/dL   Creatinine, Ser 1.47 (H) 0.44 - 1.00 mg/dL   Calcium 8.8 (L) 8.9 - 10.3 mg/dL   GFR calc non Af Amer 39 (L) >60 mL/min   GFR calc Af Amer 45 (L) >60 mL/min   Anion gap 12 5 - 15  CBC  Result Value Ref Range   WBC 8.9 4.0 - 10.5 K/uL   RBC 3.88 3.87 - 5.11 MIL/uL   Hemoglobin 10.3 (L) 12.0 - 15.0 g/dL   HCT 32.6 (L) 36.0 - 46.0 %   MCV 84.0 78.0 - 100.0 fL   MCH 26.5 26.0 - 34.0 pg   MCHC 31.6 30.0 - 36.0 g/dL   RDW 14.9 11.5 - 15.5 %   Platelets 245 150 - 400 K/uL  Brain natriuretic peptide  Result Value Ref Range   B Natriuretic Peptide 77.6 0.0 - 100.0 pg/mL  I-stat troponin, ED  Result Value Ref Range   Troponin i, poc 0.00 0.00 - 0.08 ng/mL   Comment 3           Dg Chest 2 View  02/15/2015   CLINICAL DATA:  Chest pain, shortness of breath and cough.  EXAM: CHEST  2 VIEW  COMPARISON:  10/02/2014 chest radiograph  FINDINGS: Stable cardiomediastinal silhouette, with top normal heart size. No pneumothorax. No pleural effusion.  There is mild patchy opacity in the right lower lobe. There is no overt pulmonary edema. Minimal degenerative changes are seen in the thoracic spine. Surgical clips are noted in the upper abdomen.  IMPRESSION: 1. Nonspecific mild patchy right lower lobe opacity, either atelectasis or developing pneumonia. 2. Stable top-normal heart size.  No overt pulmonary edema.   Electronically Signed   By: Ilona Sorrel M.D.   On: 02/15/2015 09:37     No results found.   EKG Interpretation None      MDM   Final diagnoses:  CAP (community acquired pneumonia)  Hypoxia    Patient with shortness of breath and chest pain that started last night. Patient also reports a significant amount of coughing. She does have a history of CHF. We'll check labs, chest x-[place], and will reassess. Patient noted to be hypoxic on arrival at 80%.  This normalized to greater than 90% with 2 L of nasal cannula oxygen. Patient has some bilateral lower extremity pitting edema and some right sided wheezes. Will give albuterol. Will reassess.  Chest x-[place] remarkable for right-sided opacity, thought to be pneumonia. Patient does not have an elevated white blood cell count or a fever, but she has had significant coughing over the past day and is hypoxic on room air. Patient discussed with Dr. Colin Rhein, who agrees with plan for admission. BNP is 77.6, troponin is negative, other labs are at or near baseline.  Will check blood cultures, start abx and consult internal medicine-unassigned for admission.    Montine Circle, PA-C 02/15/15 1227  Debby Freiberg, MD 02/16/15 [phone]

## 2015-02-15 NOTE — H&P (Signed)
Date: 02/15/2015               Patient Name:  [name] MRN: [id number]  DOB: 06-20-1956 Age / Sex: 59 y.o., female   PCP: Boykin Nearing, MD         Medical Service: Internal Medicine Teaching Service         Attending Physician: Dr. Bartholomew Crews, MD    First Contact: Dr. Liberty Handy Pager: [phone]  Second Contact: Dr. Duwaine Maxin Pager: [phone]       After Hours (After 5p/  First Contact Pager: [phone]  weekends / holidays): Second Contact Pager: [phone]   Chief Complaint: Chest pain and dyspnea  History of Present Illness: [name] is a 59 year old woman with a PMH of with T2DM,  as below who presents with dyspnea on exertion, chest pain and chills for four day.  Describes CP while doing water aerobics yesterday, stopped with rest.  She notices it worsens on exertion, and for the for the past few days has been unable to go to the gym. The CP is radiating to left shoulder.   She says it feels like muscle pain. She has had a cough, but only coughed up a little yellow sputum 3-4 days ago. She denies any nausea, vomiting, calf pain, new or worsening leg swelling, hemoptysis, drug use, or sick contacts.  She wanted to visit her daughter in [place] this morning, but was unable to because of her symptoms.   Of note, she was noted by a sleep study to have OSA, was supposed to pick of a CPAP but has not done so. She reports a similar episode last March that had a workup that included an echo, remarkable for only mild aortic stenosis and grade 1 diastolic dysfunction. EF was 65-70%. During that admission her BNP was somewhat elevated to 122.5, and her symptoms were attributed to a combination of community-acquired pneumonia and acute on chronic heart failure. In the ED, she was found to have O2 sats in the 80s, but improved to 99 after albuterol. Subjectively, her symptoms also improved. I-stat trops were negative.   Meds: Current Facility-Administered Medications  Medication Dose  Route Frequency Provider Last Rate Last Dose  . azithromycin (ZITHROMAX) 500 mg in dextrose 5 % 250 mL IVPB  500 mg Intravenous Once Montine Circle, PA-C      . nitroGLYCERIN (NITROSTAT) SL tablet 0.4 mg  0.4 mg Sublingual Q5 Min x 3 PRN Montine Circle, PA-C        Allergies: Allergies as of 02/15/2015 - Review Complete 02/15/2015  Allergen Reaction Noted  . Morphine and related  05/14/2012  . Shellfish allergy  05/14/2012   Past Medical History  Diagnosis Date  . CHF (congestive heart failure) 2011  . Bronchitis   . Diabetes mellitus Dx 1989  . Hypertension Dx 1989  . Ulcer 2010  . Hepatitis C Dx 2013  . Pancreatitis 2013  . Complication of anesthesia   . Refusal of blood transfusions as patient is Jehovah's Witness   . Asthma     As a child    Past Surgical History  Procedure Laterality Date  . Tubal ligation    . Cholecystectomy    . Knee arthroscopy    . Tubal ligation    . Eye surgery     Family History  Problem Relation Age of Onset  . Colon cancer Mother   . Heart attack Other   . Heart attack Maternal Grandmother   .  Hypertension Sister   . Hypertension Brother   . Diabetes Paternal Grandmother    History   Social History  . Marital Status: Divorced    Spouse Name: N/A  . Number of Children: N/A  . Years of Education: N/A   Occupational History  . Not on file.   Social History Main Topics  . Smoking status: Former Smoker    Quit date: 10/25/1980  . Smokeless tobacco: Never Used     Comment: quit smoking in 1982  . Alcohol Use: Yes     Comment: ocassionally  . Drug Use: No     Comment: quit 18 yrs ago  . Sexual Activity: Not on file   Other Topics Concern  . Not on file   Social History Narrative    Review of Systems: Negative except per bove  Physical Exam: Blood pressure 165/64, pulse 73, temperature 98.3 F (36.8 C), temperature source Oral, resp. rate 16, height 5\' 8"  (1.727 m), weight 282 lb (127.914 kg), SpO2 99 %. General:  resting in bed Cardiac: 3/6 systolic murmur heard best at left sternal border radiating to the carotids. Otherwise, regular rate and rhythm. Pulm: clear to auscultation bilaterally, moving normal volumes of air Abd: soft, nontender, nondistended, BS present Ext: 1+ edema extended just superior to the ankle. Distal pulses palpated. No clubbing or cyanosis. Neuro: Alert and cooperative to exam  Lab results: Basic Metabolic Panel:  Recent Labs  02/15/15 0946  NA 139  K 3.9  CL 106  CO2 21*  GLUCOSE 185*  BUN 42*  CREATININE 1.47*  CALCIUM 8.8*   CBC:  Recent Labs  02/15/15 0946  WBC 8.9  HGB 10.3*  HCT 32.6*  MCV 84.0  PLT 245   Urine Drug Screen: Drugs of Abuse     Component Value Date/Time   LABOPIA NONE DETECTED 03/20/2009 1702   COCAINSCRNUR NONE DETECTED 03/20/2009 1702   LABBENZ NONE DETECTED 03/20/2009 1702   AMPHETMU NONE DETECTED 03/20/2009 1702   THCU NONE DETECTED 03/20/2009 1702   LABBARB  03/20/2009 1702    NONE DETECTED        DRUG SCREEN FOR MEDICAL PURPOSES ONLY.  IF CONFIRMATION IS NEEDED FOR ANY PURPOSE, NOTIFY LAB WITHIN 5 DAYS.        LOWEST DETECTABLE LIMITS FOR URINE DRUG SCREEN Drug Class       Cutoff (ng/mL) Amphetamine      1000 Barbiturate      200 Benzodiazepine   [id number] Tricyclics       [id number] Opiates          300 Cocaine          300 THC              50     Imaging results:  Dg Chest 2 View  02/15/2015   CLINICAL DATA:  Chest pain, shortness of breath and cough.  EXAM: CHEST  2 VIEW  COMPARISON:  10/02/2014 chest radiograph  FINDINGS: Stable cardiomediastinal silhouette, with top normal heart size. No pneumothorax. No pleural effusion. There is mild patchy opacity in the right lower lobe. There is no overt pulmonary edema. Minimal degenerative changes are seen in the thoracic spine. Surgical clips are noted in the upper abdomen.  IMPRESSION: 1. Nonspecific mild patchy right lower lobe opacity, either atelectasis or developing  pneumonia. 2. Stable top-normal heart size.  No overt pulmonary edema.   Electronically Signed   By: Ilona Sorrel M.D.   On: 02/15/2015 09:37  Assessment & Plan by Problem:  Acute respiratory failure with hypoxia:  Noted to have SpO2 80% upon arrival to ED that improved to 99% on 2L via [place].  Chills starting 4 days ago.  DOE and CP on exertion began yesterday and she had to sleep sitting up in recliner due to dyspnea.  No pulmonary edema on CXR and BNP 77.6, making making HF exacerbation less likely.  The differential may also include CAP, She has chills but no fever. Chest Xray demonstrates right lower lobe opacity. No leukocytosis. She received 1g of Ceftriaxone in the ED.  Her Wells score of zero makes making PE unlikely. Aortic stenosis demonstrated on previous Echo does not seem severe enough to be a cause. - O2 2L [place] - Albuterol q6hrs prn - PO Lasix 40 mg - Legionella - Strep pneumoniae UA - Sputum culture pending  Chest Pain on Exertion:  Could be a presentation of ACS, I-stat troponin and troponins negative x1. As mentioned, zero wells score makes PE unlikely. If she indeed has pneumonia, this could be the cause. - Nitroglycerin prn  T2DM:  Glucose of 185 on admission. - Lantus 20U at bedime with SSI  Gastroparesis - Continue Reglan  HTN:  Continue Norvasc, Carvedilol  Obstructive Sleep Apnea:  CPAP  Hepatitis C:  HCV quantitative is elevated 2 weeks ago. According to previous clinic notes, the patient seems aware but can confirm.  CKD (chronic kidney disease) stage 3, GFR 30-59 ml/min - Cr. near baseline of 1.4   Dispo: Disposition is deferred at this time, awaiting improvement of current medical problems. Anticipated discharge in approximately 2-3 day(s).   The patient does have a current PCP (Boykin Nearing, MD) and does need an [facility] hospital follow-up appointment after discharge.  The patient does not have transportation limitations that hinder transportation to clinic  appointments.  Signed: Liberty Handy, MD 02/15/2015, 4:19 PM

## 2015-02-16 ENCOUNTER — Observation Stay (HOSPITAL_COMMUNITY): Payer: Self-pay

## 2015-02-16 ENCOUNTER — Observation Stay (HOSPITAL_COMMUNITY): Payer: Medicaid Other

## 2015-02-16 DIAGNOSIS — Z794 Long term (current) use of insulin: Secondary | ICD-10-CM

## 2015-02-16 DIAGNOSIS — J9601 Acute respiratory failure with hypoxia: Secondary | ICD-10-CM

## 2015-02-16 DIAGNOSIS — R079 Chest pain, unspecified: Secondary | ICD-10-CM

## 2015-02-16 DIAGNOSIS — E1122 Type 2 diabetes mellitus with diabetic chronic kidney disease: Secondary | ICD-10-CM

## 2015-02-16 DIAGNOSIS — E1143 Type 2 diabetes mellitus with diabetic autonomic (poly)neuropathy: Secondary | ICD-10-CM

## 2015-02-16 DIAGNOSIS — B1920 Unspecified viral hepatitis C without hepatic coma: Secondary | ICD-10-CM

## 2015-02-16 DIAGNOSIS — R0600 Dyspnea, unspecified: Secondary | ICD-10-CM

## 2015-02-16 DIAGNOSIS — G4733 Obstructive sleep apnea (adult) (pediatric): Secondary | ICD-10-CM

## 2015-02-16 DIAGNOSIS — K3184 Gastroparesis: Secondary | ICD-10-CM

## 2015-02-16 DIAGNOSIS — N183 Chronic kidney disease, stage 3 (moderate): Secondary | ICD-10-CM

## 2015-02-16 DIAGNOSIS — I129 Hypertensive chronic kidney disease with stage 1 through stage 4 chronic kidney disease, or unspecified chronic kidney disease: Secondary | ICD-10-CM

## 2015-02-16 LAB — CBC
HCT: 30.6 % — ABNORMAL LOW (ref 36.0–46.0)
Hemoglobin: 9.8 g/dL — ABNORMAL LOW (ref 12.0–15.0)
MCH: 27.2 pg (ref 26.0–34.0)
MCHC: 32 g/dL (ref 30.0–36.0)
MCV: 85 fL (ref 78.0–100.0)
Platelets: 237 10*3/uL (ref 150–400)
RBC: 3.6 MIL/uL — ABNORMAL LOW (ref 3.87–5.11)
RDW: 15.1 % (ref 11.5–15.5)
WBC: 7.3 10*3/uL (ref 4.0–10.5)

## 2015-02-16 LAB — GLUCOSE, CAPILLARY
Glucose-Capillary: 153 mg/dL — ABNORMAL HIGH (ref 65–99)
Glucose-Capillary: 196 mg/dL — ABNORMAL HIGH (ref 65–99)
Glucose-Capillary: 161 mg/dL — ABNORMAL HIGH (ref 65–99)
Glucose-Capillary: 183 mg/dL — ABNORMAL HIGH (ref 65–99)

## 2015-02-16 LAB — BASIC METABOLIC PANEL
Anion gap: 10 (ref 5–15)
BUN: 42 mg/dL — ABNORMAL HIGH (ref 6–20)
CO2: 23 mmol/L (ref 22–32)
Calcium: 8.4 mg/dL — ABNORMAL LOW (ref 8.9–10.3)
Chloride: 105 mmol/L (ref 101–111)
Creatinine, Ser: 1.66 mg/dL — ABNORMAL HIGH (ref 0.44–1.00)
GFR calc Af Amer: 39 mL/min — ABNORMAL LOW (ref >60)
GFR calc non Af Amer: 33 mL/min — ABNORMAL LOW (ref >60)
Glucose, Bld: 189 mg/dL — ABNORMAL HIGH (ref 65–99)
Potassium: 3.9 mmol/L (ref 3.5–5.1)
Sodium: 138 mmol/L (ref 135–145)

## 2015-02-16 LAB — D-DIMER, QUANTITATIVE (NOT AT ARMC): D-Dimer, Quant: <0.27 ug/mL-FEU (ref 0.00–0.48)

## 2015-02-16 LAB — TROPONIN I: Troponin I: <0.03 ng/mL (ref <0.031)

## 2015-02-16 MED ORDER — DEXTROSE 5 % IV SOLN
1.0000 g | INTRAVENOUS | Status: DC
  Administered 2015-02-16: 1 g via INTRAVENOUS
  Filled 2015-02-16 (×2): qty 10

## 2015-02-16 MED ORDER — SODIUM CHLORIDE 0.9 % IV SOLN
INTRAVENOUS | Status: DC
  Administered 2015-02-16: 75 mL/h via INTRAVENOUS

## 2015-02-16 MED ORDER — AZITHROMYCIN 500 MG PO TABS
500.0000 mg | ORAL_TABLET | Freq: Every day | ORAL | Status: DC
  Administered 2015-02-16 – 2015-02-18 (×3): 500 mg via ORAL
  Filled 2015-02-16 (×3): qty 1

## 2015-02-16 NOTE — Progress Notes (Signed)
Date: 02/16/2015  Patient name: [name]  Medical record number: [id number]  Date of birth: 1956/02/06   I have seen and evaluated [name] and discussed their care with the Residency Team. Ms Batch is a very nice 59 yo with insulin dependent DM, diabetic gastroparesis, h/o CAP in March [id number], and h/o diastolic HF. She has been making sig and meaningful lifestyle changes over the past month - doing water aerobics and other exercises at a gym. This has resulted in a lowering of her glucose levels. She was recently dx with OSA June 2016 with AHI 17.9 and RDI 20.7. She has not yet obtained a CPAP machine. She was in her usual state of health and was able to complete her hr long water aerobics class and other exercises on the 4th. She went to class on the 5th and was unable to complete the water aerobics class bc she was tired and couldn't keep up. She went home and later that day, woke up at midnight with sudden onset of dyspnea and CP. She was found to be hypoxic on ED presentation to 80% on RA. She quickly responded to 2 L. She had mild end exp wheezing on exam and got albuterol.  She has had asthma since a child. She has an alb MDI and has to use it less than 2 times weekly. Cigarette smoke and exercise are her triggers.   She was was admitted in March for CAP and acute on chronic diastolic HF (BNP [id number]). ECHO as that time showed grade 1 diastolic dysfxn and mild AS and regurg. Multiple notes during that stay indicate she did not have a murmur. One cardiologist described murmur as 2/6.   She has had no VTE. No recent immobilization. Her father had a blood clot in his 26's.   She tolerated CPAP last PM quite well.Feels 96% better this AM but got DOE walking to door.  Filed Vitals:   02/16/15 1019  BP: 139/56  Pulse:   Temp:   Resp:    Max heart rate 79 No temp since admit RR 16 O2 sat 87% with walking Mild wheeze heard without stethoscope when I first entered room but cleared. HRRR 3/6  systolic murmur sternal borders LCTAB with good air flow and speaking in full sentences  EXT trace LE edema B  Assessment and Plan: I have seen and evaluated the patient as outlined above. I agree with the formulated Assessment and Plan as detailed in the residents' admission note, with the following changes:   1. Acute hypoxic respiratory failure - The sudden onset makes PE possible even though the Well's score is 0. I doubt pul edema as the cause of her hypoxia as her BNP is 76. I doubt CAP as the cause as her CXR doesn't show an infiltrate although radiology read it as RLL opacity and no leukocytosis or fever. Asthma exac is possible - chlorine exposure and exercising but her exam shows no wheezing today. ILD is possible - although exam and CXR not consistent. R sided HF possible - untx OSA but R side heart nl in March. She is fully awake and is not hypoventilating. Her heart murmur is more pronounced today compared to recent notes.  D dimer CT - with contrast if d dimer + ECHO  2. Chest pain - R/O for ACS. EKG only shows possible RAE. No pain today. C/O with R shoulder ROM. Symptomatic tx for MS pain.  3. DM - follow CBG.  Real Cons  Lynnae January, MD 8/6/201610:53 AM

## 2015-02-16 NOTE — Progress Notes (Signed)
Subjective:  [name] reported feeling much better today after using CPAP last night and felt ready to go home. Denied shortness of breath and chest pain early this morning. However, her O2 sats decreased to 87% when ambulating without oxygen later this morning.    Objective: Vital signs in last 24 hours: Filed Vitals:   02/15/15 2058 02/16/15 0107 02/16/15 0531 02/16/15 1019  BP: 130/52  133/72 139/56  Pulse: 64 70 67   Temp: 98.6 F (37 C)  98.4 F (36.9 C)   TempSrc: Oral  Oral   Resp: 18 16 16    Height:      Weight:   128.958 kg (284 lb 4.8 oz)   SpO2: 97% 98% 98%    Weight change:   Intake/Output Summary (Last 24 hours) at 02/16/15 1130 Last data filed at 02/16/15 0900  Gross per 24 hour  Intake    480 ml  Output      0 ml  Net    480 ml   Physical Exam:  General: well nourished female sitting on bed comfortably, NAD  CV: RRR, 3/6 crescendo-decrescendo systolic murmur that radiates to carotids consistent with aortic stenosis Pulm: CTAB, good air movement, no increased work of breathing  Abd: soft, non-tender, non-distended, BS+ Ext: warm and well perfused, 1+ pitting edema on both lower extremities Neuro: A&O x4, no focal deficits   MSK: left shoulder pain reproducible with movement and touch   Lab Results:  CBC    Component Value Date/Time   WBC 7.3 02/16/2015 0222   RBC 3.60* 02/16/2015 0222   RBC 3.59* 04/05/2008 2220   HGB 9.8* 02/16/2015 0222   HCT 30.6* 02/16/2015 0222   PLT 237 02/16/2015 0222   MCV 85.0 02/16/2015 0222   MCH 27.2 02/16/2015 0222   MCHC 32.0 02/16/2015 0222   RDW 15.1 02/16/2015 0222   LYMPHSABS 1.3 10/02/2014 0433   MONOABS 0.4 10/02/2014 0433   EOSABS 0.2 10/02/2014 0433   BASOSABS 0.0 10/02/2014 0433    BMP Latest Ref Rng 02/16/2015 02/15/2015 01/31/2015  Glucose 65 - 99 mg/dL 189(H) 185(H) 150(H)  BUN 6 - 20 mg/dL 42(H) 42(H) 50(H)  Creatinine 0.44 - 1.00 mg/dL 1.66(H) 1.47(H) 1.46(H)  Sodium 135 - 145 mmol/L 138 139 139    Potassium 3.5 - 5.1 mmol/L 3.9 3.9 4.1  Chloride 101 - 111 mmol/L 105 106 104  CO2 22 - 32 mmol/L 23 21(L) 21  Calcium 8.9 - 10.3 mg/dL 8.4(L) 8.8(L) 8.9   Troponins: <0.03 x3  BNP: 77.6  Micro Results: No results found for this or any previous visit (from the past 240 hour(s)). Studies/Results: Dg Chest 2 View  02/15/2015   CLINICAL DATA:  Chest pain, shortness of breath and cough.  EXAM: CHEST  2 VIEW  COMPARISON:  10/02/2014 chest radiograph  FINDINGS: Stable cardiomediastinal silhouette, with top normal heart size. No pneumothorax. No pleural effusion. There is mild patchy opacity in the right lower lobe. There is no overt pulmonary edema. Minimal degenerative changes are seen in the thoracic spine. Surgical clips are noted in the upper abdomen.  IMPRESSION: 1. Nonspecific mild patchy right lower lobe opacity, either atelectasis or developing pneumonia. 2. Stable top-normal heart size.  No overt pulmonary edema.   Electronically Signed   By: Ilona Sorrel M.D.   On: 02/15/2015 09:37   Medications: I have reviewed the patient's current medications. Scheduled Meds: . amLODipine  10 mg Oral Daily  . aspirin EC  325 mg Oral  Daily  . atorvastatin  20 mg Oral Daily  . carvedilol  25 mg Oral BID WC  . enoxaparin (LOVENOX) injection  60 mg Subcutaneous Q24H  . famotidine  20 mg Oral BID  . ferrous sulfate  325 mg Oral BID WC  . gabapentin  600 mg Oral QHS  . hydrALAZINE  10 mg Oral BID  . insulin aspart  0-5 Units Subcutaneous QHS  . insulin aspart  0-9 Units Subcutaneous TID WC  . insulin glargine  20 Units Subcutaneous QHS  . lisinopril  40 mg Oral Daily  . metoCLOPramide  10 mg Oral TID AC  . sodium chloride  3 mL Intravenous Q12H  . valACYclovir  500 mg Oral Daily   Continuous Infusions: . sodium chloride     PRN Meds:.acetaminophen **OR** acetaminophen, albuterol, nitroGLYCERIN, polyethylene glycol Assessment/Plan:   Acute respiratory failure with hypoxia: Patient presented  with acute onset of SOB associated with chest pain that started on Thursday, four days of chills and no fever. She was noted to have SpO2 80% in  ED that improved to 99% on 2L via [place] and use of albuterol. CXR in ED showed a nonspecific mild patchy RLL opacity (atelectasis vs. PNA) and no pulmonary edema. ECG was unremarkable, troponin negative x3, and BNP 77.6. Differential diagnosis includes PE given the sudden onset of SOB (however, Wells score is 0), CAP given chills and CXR findings (no fever, no leukocytosis, no sick contacts), CHF exacerbation given orthopnea and bilateral LLE edema (normal BNP, lungs sound dry on exam), untreated OSA given that patient just started using CPAP on 8/5, symptomatic aortic stenosis given her dyspnea and angina (stenosis considered mild on TTE performed in March 2016, so less likely to be the cause of her symptoms), and asthma exacerbation given that SOB seemed to be relieved with albuterol.   - Continue O2 2L [place] - Continue Albuterol q6hrs prn - Stat D-dimer ordered - TTE ordered to assess heart function and progression of valvular disease  - Home PO Lasix 40mg  discontinued and IVF NS 9mL/hr started in case patient needs CTA PE protocol  - Legionella UA pending  - Strep pneumoniae UA pending  - Sputum culture pending - Blood cultures pending   Chest Pain on Exertion: Patient reports CP is associated with DOE that started on Thursday. Pain is not reproducible on exam. This could be a presentation of ACS. However, ECG was unremarkable and troponins negative x3. Other possible etiologies include PE (Wells score 0) and PNA.   - Nitroglycerin prn -TTE ordered to assess heart function and progression of valvular disease  - PE work-up as described above   Obstructive Sleep Apnea: Recently diagnosed. Patient reported participating in a sleep study on 6/29 where she was diagnosed with OSA. Per study report, the average O2 sat was 92%.The lowest O2 sat was 74%.The time  spent with oxygen saturations <88% was 8.6 minutes. It was concluded that patient has moderate OSA/hypopnea syndrome with an AHI of 18 events per hour.Most events occurred during REM sleep in the supine position. They recommended ResMed CPAP with heated humidity, C flex of 3 and small Resmed Airfit F-10 full face mask with CPAP set at 11cm H2O, proper sleep hygiene, weight reduction for elevated BMI, avoidance of sleeping in the supine position and avoidance of alcohol within four hours of bedtime. - CPAP started on 8/4   T2DM: Glucose of 185 on admission. - Lantus 20U at bedime with SSI  Gastroparesis: stable. No new complaints  of nausea or vomiting  - Continue home Reglan 10mg  TID before meals   HTN: stable   - Continue home Amlodipine 10mg  daily - Continue home Carvedilol 25mg  BID   Hepatitis C: Per clinic note on 7/21, patient was diagnosed in 2014. HCV RNA quantitative elevated [id number]) and HCV antibody reactive on 7/21. LFTs normal on 7/21. Patient currently asymptomatic.   - Continue Valacyclovir 500mg  daily  - Consider ordering LFTs if patient develops symptoms consistent with infection   CKD (chronic kidney disease) stage 3, GFR 30-59 ml/min - Cr. near baseline of 1.4  Dispo: Disposition is deferred at this time, awaiting improvement of current medical problems.  Anticipated discharge in approximately 2-3 day(s).   The patient does have a current PCP (Boykin Nearing, MD) and does need an [facility] hospital follow-up appointment after discharge.  The patient does not have transportation limitations that hinder transportation to clinic appointments.   LOS: 1 day   Welford Roche, Med Student 02/16/2015, 11:30 AM

## 2015-02-16 NOTE — Progress Notes (Signed)
Subjective: [name] is feeling better this morning and is more active. She used her CPAP last night and said she felt well rested. However, while she can walk farther, she says she does have shortness of breath walking up and down the hallway. She denies any chest pain or wheezing today. She denies chills. When she was walked with the nurse off of her oxygen, her O2 sats dipped to 87%. She also clarified some of her history, that her SOB and chest pain symptoms started on Thursday, and that her father had had blood clots.   Objective: Vital signs in last 24 hours: Filed Vitals:   02/15/15 2058 02/16/15 0107 02/16/15 0531 02/16/15 1019  BP: 130/52  133/72 139/56  Pulse: 64 70 67   Temp: 98.6 F (37 C)  98.4 F (36.9 C)   TempSrc: Oral  Oral   Resp: 18 16 16    Height:      Weight:   284 lb 4.8 oz (128.958 kg)   SpO2: 97% 98% 98%    Weight change:   Intake/Output Summary (Last 24 hours) at 02/16/15 1059 Last data filed at 02/16/15 0900  Gross per 24 hour  Intake    480 ml  Output      0 ml  Net    480 ml   General: Sitting up in bed comfortably. Not in acute distress Cardiac: 3/6 systolic murmur heard best at left sternal border radiating to the carotids. Otherwise, regular rate and rhythm. Pulm: clear to auscultation bilaterally, moving normal volumes of air Abd: soft, nontender, nondistended, BS present Ext: 1+ edema extended just superior to the ankle. Distal pulses palpated. No clubbing or cyanosis. Neuro: Alert and cooperative to exam  Lab Results: Basic Metabolic Panel:  Recent Labs Lab 02/15/15 0946 02/16/15 0222  NA 139 138  K 3.9 3.9  CL 106 105  CO2 21* 23  GLUCOSE 185* 189*  BUN 42* 42*  CREATININE 1.47* 1.66*  CALCIUM 8.8* 8.4*   CBC:  Recent Labs Lab 02/15/15 0946 02/16/15 0222  WBC 8.9 7.3  HGB 10.3* 9.8*  HCT 32.6* 30.6*  MCV 84.0 85.0  PLT 245 237   Cardiac Enzymes:  Recent Labs Lab 02/15/15 1525 02/15/15 2039 02/16/15 0222    TROPONINI <0.03 <0.03 <0.03   BNP: 77.6   Recent Labs Lab 02/15/15 1637 02/15/15 2048 02/16/15 0621  GLUCAP 151* 245* 153*    Micro Results: No results found for this or any previous visit (from the past 240 hour(s)). Studies/Results: Dg Chest 2 View  02/15/2015   CLINICAL DATA:  Chest pain, shortness of breath and cough.  EXAM: CHEST  2 VIEW  COMPARISON:  10/02/2014 chest radiograph  FINDINGS: Stable cardiomediastinal silhouette, with top normal heart size. No pneumothorax. No pleural effusion. There is mild patchy opacity in the right lower lobe. There is no overt pulmonary edema. Minimal degenerative changes are seen in the thoracic spine. Surgical clips are noted in the upper abdomen.  IMPRESSION: 1. Nonspecific mild patchy right lower lobe opacity, either atelectasis or developing pneumonia. 2. Stable top-normal heart size.  No overt pulmonary edema.   Electronically Signed   By: Ilona Sorrel M.D.   On: 02/15/2015 09:37   Medications:  Scheduled Meds: . amLODipine  10 mg Oral Daily  . aspirin EC  325 mg Oral Daily  . atorvastatin  20 mg Oral Daily  . carvedilol  25 mg Oral BID WC  . enoxaparin (LOVENOX) injection  60 mg Subcutaneous  Q24H  . famotidine  20 mg Oral BID  . ferrous sulfate  325 mg Oral BID WC  . gabapentin  600 mg Oral QHS  . hydrALAZINE  10 mg Oral BID  . insulin aspart  0-5 Units Subcutaneous QHS  . insulin aspart  0-9 Units Subcutaneous TID WC  . insulin glargine  20 Units Subcutaneous QHS  . lisinopril  40 mg Oral Daily  . metoCLOPramide  10 mg Oral TID AC  . sodium chloride  3 mL Intravenous Q12H  . valACYclovir  500 mg Oral Daily   Continuous Infusions: . sodium chloride     PRN Meds:.acetaminophen **OR** acetaminophen, albuterol, nitroGLYCERIN, polyethylene glycol Assessment/Plan: Active Problems:   Acute respiratory failure with hypoxia   Chest pain on exertion   CAP (community acquired pneumonia)   CKD (chronic kidney disease) stage 3, GFR  30-59 ml/min   Dyspnea  Acute respiratory failure with hypoxia: Noted to have SpO2 80% upon arrival to ED that improved to 99% on 2L via [place]. While she is improved today, able to walk further without exertion, her continued hypoxia with exertion is concerning. With her low to intermediate Wells score, we will first obtain a stat D-dimer. If positive, with a GFR 39, she is a candidate for a reduced dose of contrast with her CTA. Alternatively, her mild diastolic heart failure may have worsened. At this point concern for pneumonia is low given she has been afebrile without a white count. - D-dimer pending, CTA with reduced dose of contrast if positive. CT chest without contrast if negative - Echo pending, Echo team says they will attempt to see her today.  - O2 2L [place] - Albuterol q6hrs prn - Discontinue PO Lasix 40 mg to preserve renal function in anticipation of CTA - Legionella pending - Strep pneumoniae UA pending - Sputum culture pending  Chest Pain on Exertion: See PE workup above. Troponins negative x3 - Nitroglycerin prn  T2DM: Glucose of 185 on admission. - Lantus 20U at bedime with SSI  Gastroparesis - Continue Reglan  HTN: Continue Norvasc, Carvedilol  Obstructive Sleep Apnea: CPAP  Hepatitis C: HCV quantitative is elevated 2 weeks ago. According to previous clinic notes, the patient seems aware but can confirm.  CKD (chronic kidney disease) stage 3, GFR 30-59 ml/min - Increased to 1.6 today. May receive contrast if D-dimer is positive per above - D/C Lasix - NS 75 cc/hr  Dispo: Disposition is deferred at this time, awaiting improvement of current medical problems.  Anticipated discharge in approximately 0-2 day(s).   The patient does have a current PCP (Boykin Nearing, MD) and does need an [facility] hospital follow-up appointment after discharge.  The patient does not have transportation limitations that hinder transportation to clinic appointments.  .Services Needed at  time of discharge: Y = Yes, Blank = No PT:   OT:   RN:   Equipment:   Other:     LOS: 1 day   Liberty Handy, MD 02/16/2015, 10:59 AM

## 2015-02-17 LAB — EXPECTORATED SPUTUM ASSESSMENT W REFEX TO RESP CULTURE

## 2015-02-17 LAB — BASIC METABOLIC PANEL
Anion gap: 9 (ref 5–15)
BUN: 43 mg/dL — ABNORMAL HIGH (ref 6–20)
CO2: 23 mmol/L (ref 22–32)
Calcium: 8.5 mg/dL — ABNORMAL LOW (ref 8.9–10.3)
Chloride: 107 mmol/L (ref 101–111)
Creatinine, Ser: 1.49 mg/dL — ABNORMAL HIGH (ref 0.44–1.00)
GFR calc Af Amer: 44 mL/min — ABNORMAL LOW (ref >60)
GFR calc non Af Amer: 38 mL/min — ABNORMAL LOW (ref >60)
Glucose, Bld: 150 mg/dL — ABNORMAL HIGH (ref 65–99)
Potassium: 4 mmol/L (ref 3.5–5.1)
Sodium: 139 mmol/L (ref 135–145)

## 2015-02-17 LAB — GLUCOSE, CAPILLARY
Glucose-Capillary: 157 mg/dL — ABNORMAL HIGH (ref 65–99)
Glucose-Capillary: 158 mg/dL — ABNORMAL HIGH (ref 65–99)
Glucose-Capillary: 171 mg/dL — ABNORMAL HIGH (ref 65–99)
Glucose-Capillary: 223 mg/dL — ABNORMAL HIGH (ref 65–99)

## 2015-02-17 LAB — EXPECTORATED SPUTUM ASSESSMENT W GRAM STAIN, RFLX TO RESP C

## 2015-02-17 MED ORDER — FUROSEMIDE 40 MG PO TABS
40.0000 mg | ORAL_TABLET | Freq: Two times a day (BID) | ORAL | Status: DC
  Administered 2015-02-17 – 2015-02-18 (×2): 40 mg via ORAL
  Filled 2015-02-17 (×4): qty 1

## 2015-02-17 NOTE — Progress Notes (Signed)
Patient ID: [name], female   DOB: 1956/04/15, 59 y.o.   MRN: [id number]   Subjective: [name] said she slept well last night and just finished a full breakfast. She has been breathing well and has not been coughing, had a fever, chest pain, or any other complaints.  I asked her about her IVDA and she said she has been clean for 18 years and recently joined [employer] one month ago. She has been doing water aerobic classes and spending a lot of time in the sauna. She became tearful when discussing her recent health and said she's trying to be strong like Job from the bible. She denied any bruising on her shins, any other skin lesions, changes in her vision or swelling of her eyes, nail changes, or family history of sarcoidosis.  Objective: Vital signs in last 24 hours: Filed Vitals:   02/16/15 1800 02/16/15 2002 02/16/15 2350 02/17/15 0527  BP: 148/64 154/65  131/59  Pulse: 68 72 72 64  Temp:  97.7 F (36.5 C)  98.6 F (37 C)  TempSrc:  Oral  Oral  Resp:  17 18 18   Height:      Weight:    128.912 kg (284 lb 3.2 oz)  SpO2:  95%  96%   General: resting in bed finishing up breakfast HEENT: EOMI, no scleral icterus Cardiac: Regular rate and rhythm, 2/6 systolic murmur loudest over Erb's space, normal S1/S2. 1+ pitting edema on bilateral lower extremities. Pulm: She is breathing comfortably. Some expiratory wheezing in right lower lung base. No inspiratory crackles. Abd: soft, nontender, nondistended, BS present Ext: warm and well perfused, no pedal edema. No skin lesions.  Lab Results: D-dimer negative  Micro Results: Blood cultures negative x 24 hours  Studies/Results:  Ct Chest Wo Contrast 02/16/2015  IMPRESSION: 1. There are subtle areas of hazy ground-glass opacity in a patchy distribution most evident in the lower lobes and right upper lobe. This is nonspecific. It may reflect multifocal areas of infection or inflammation. There is no convincing pulmonary edema. 2. Additional  linear and reticular opacities in the lower lobes consistent with subsegmental atelectasis.   Electronically Signed   By: Lajean Manes M.D.   On: 02/16/2015 16:43   Medications: I have reviewed the patient's current medications. Scheduled Meds: . amLODipine  10 mg Oral Daily  . aspirin EC  325 mg Oral Daily  . atorvastatin  20 mg Oral Daily  . azithromycin  500 mg Oral Daily  . carvedilol  25 mg Oral BID WC  . cefTRIAXone (ROCEPHIN)  IV  1 g Intravenous Q24H  . enoxaparin (LOVENOX) injection  60 mg Subcutaneous Q24H  . famotidine  20 mg Oral BID  . ferrous sulfate  325 mg Oral BID WC  . gabapentin  600 mg Oral QHS  . hydrALAZINE  10 mg Oral BID  . insulin aspart  0-5 Units Subcutaneous QHS  . insulin aspart  0-9 Units Subcutaneous TID WC  . insulin glargine  20 Units Subcutaneous QHS  . metoCLOPramide  10 mg Oral TID AC  . sodium chloride  3 mL Intravenous Q12H  . valACYclovir  500 mg Oral Daily   Continuous Infusions:  PRN Meds:.acetaminophen **OR** acetaminophen, albuterol, nitroGLYCERIN, polyethylene glycol   Assessment/Plan: Dyspnea and chest pain on exertion with abnormal CT findings: The unifying diagnosis is unclear to me; I think that her dyspnea is caused by both her heart failure with preserved ejection fraction as well as her abnormal CT  findings (for which I have discussed below). Her dyspnea and chest pain began acutely 4 days ago and was associated with a cough and subjective fever. Chest CT showed subtle areas of hazy ground-glass opacity in a patchy distribution most evident in the lower lobes and right upper lobe. D-dimer was negative, troponins negative, EKG normal, and TTE showed some slightly worsening HF with preserved EF but CT was negative for effusions.  My differential includes: 1. Hepatitis C fibrotic changes of lung: This may be the explanation of her non-specific findings on CT but does not completely explain her symptomatology. She was recently diagnosed  with HCV three weeks ago after IV-drug abuse 18 years ago (she has been clean since then). Her chest CT yesterday showed non-specific ground glass opacities. Similar to its effects on the liver, Hepatitis C can cause non-specific pulmonary inflammation that can eventually to fibrotic changes (Chest 2008). She has not initiated therapy for HCV yet but is awaiting a call from the clinic.  2. Community-acquired pneumonia: Her symptoms do not quite fit CAP and her CT findings are equivocal at best. She did have acute onset dyspnea and subjective fevers. However, less likely given absence of fever now, malaise, sore throat, sick contacts. 3. Worsening diastolic heart failure:  She endorsed orthopnea and had mild 1+ pitting edema on exam. Her TTE showed progression from Grade 1 to Grade 2 dCHF from March 2016 to yesterday.This may explain the latter findings but doesn't fully explain the findings on chest CT. She is on 40mg  PO lasix at home. 4. Sarcoidosis: Possible given diastolic heart failure and non-specific ground glass findings on chest CT, but less likely given normocalcemic and no other findings of sarcoid, no family history, and no hilar lymphadenopathy. 5. Legionella/[place] fever: Possible given history of using spas and saunas at [employer], dyspnea, headache, aypical CT findings, but less likely given absence fo gastrointestinal symptoms, hyponatremia,   -Discontinue ceftriaxone as I believe we have confidently ruled out bacterial pneumonia -Will continue azithromycin empirically for atypical infection -Will re-trial ambulation with pulse oximetry monitoring today -Resume home dose of 40mg  furosemide -Follow-up Strep pneumo urinary antigen -Follow-up Legionella urinary antigen (possible given h/o sauna use but unlikely given CT findings, no hyponatremia, no overt reason to be immunocompromised)  Hepatitis C: Recently diagnosed 3 weeks ago after IV drug abuse. No signs suggestive of cirrhosis or  other extrahepatic manifestations. -She is waiting for a call back from clinic to discuss treatment options  Chronic kidney disease, stage 3: Creatine 1.66->1.49 today.  Obstructive sleep apnea/Pickwickian syndrome: Diagnosed in June. -On home CPAP settings  Hypertension: Non-issue while inpatient -Continue home dose of amlodipine 10mg  daily and carvedilol 25mg  BID  Type 2 diabetes mellitus: Well-controlled on home dose. Glu 150 today. -Continue Lantus 20U qhs with SSI  Gastroparesis: Non-issue while inpatient -Continue home dose metoclopramide 10mg  TID  Dispo: Disposition is deferred at this time, awaiting improvement of current medical problems.  Anticipated discharge in approximately 0-1 day(s).    The patient does have a current PCP (Boykin Nearing, MD) and does need an [facility] hospital follow-up appointment after discharge.  The patient does not know have transportation limitations that hinder transportation to clinic appointments.  .Services Needed at time of discharge: Y = Yes, Blank = No PT:   OT:   RN:   Equipment:   Other:     LOS: 2 days   Loleta Chance, MD 02/17/2015, 8:33 AM

## 2015-02-17 NOTE — Progress Notes (Signed)
02/17/2015 0940 Pt. Ambulated in hallway and oxygen saturations decreased to 86 % on room air. Pt. Did c/o SOB and dizziness during walk. BP stable at 155/58. Pt. Assisted back to room at oxygen saturation increased to 95% on room air with rest. Pt. Placed back on 2L [place]. Pt. Instructed to call for assistance before ambulating in hallway due to dizziness. Pt. Verbalized understanding. Will continue to monitor patient.  Lavel Rieman, Arville Lime

## 2015-02-18 DIAGNOSIS — J189 Pneumonia, unspecified organism: Secondary | ICD-10-CM | POA: Insufficient documentation

## 2015-02-18 LAB — COMPREHENSIVE METABOLIC PANEL WITH GFR
ALT: 22 U/L (ref 14–54)
AST: 22 U/L (ref 15–41)
Albumin: 3 g/dL — ABNORMAL LOW (ref 3.5–5.0)
Alkaline Phosphatase: 104 U/L (ref 38–126)
Anion gap: 10 (ref 5–15)
BUN: 40 mg/dL — ABNORMAL HIGH (ref 6–20)
CO2: 23 mmol/L (ref 22–32)
Calcium: 8.4 mg/dL — ABNORMAL LOW (ref 8.9–10.3)
Chloride: 105 mmol/L (ref 101–111)
Creatinine, Ser: 1.52 mg/dL — ABNORMAL HIGH (ref 0.44–1.00)
GFR calc Af Amer: 43 mL/min — ABNORMAL LOW (ref >60)
GFR calc non Af Amer: 37 mL/min — ABNORMAL LOW (ref >60)
Glucose, Bld: 237 mg/dL — ABNORMAL HIGH (ref 65–99)
Potassium: 4 mmol/L (ref 3.5–5.1)
Sodium: 138 mmol/L (ref 135–145)
Total Bilirubin: 0.3 mg/dL (ref 0.3–1.2)
Total Protein: 6.5 g/dL (ref 6.5–8.1)

## 2015-02-18 LAB — CBC WITH DIFFERENTIAL/PLATELET
Basophils Absolute: 0 K/uL (ref 0.0–0.1)
Basophils Relative: 0 % (ref 0–1)
Eosinophils Absolute: 0.3 K/uL (ref 0.0–0.7)
Eosinophils Relative: 4 % (ref 0–5)
HCT: 30 % — ABNORMAL LOW (ref 36.0–46.0)
Hemoglobin: 9.3 g/dL — ABNORMAL LOW (ref 12.0–15.0)
Lymphocytes Relative: 32 % (ref 12–46)
Lymphs Abs: 2.3 K/uL (ref 0.7–4.0)
MCH: 26.4 pg (ref 26.0–34.0)
MCHC: 31 g/dL (ref 30.0–36.0)
MCV: 85.2 fL (ref 78.0–100.0)
Monocytes Absolute: 0.5 K/uL (ref 0.1–1.0)
Monocytes Relative: 7 % (ref 3–12)
Neutro Abs: 4 K/uL (ref 1.7–7.7)
Neutrophils Relative %: 57 % (ref 43–77)
Platelets: 216 K/uL (ref 150–400)
RBC: 3.52 MIL/uL — ABNORMAL LOW (ref 3.87–5.11)
RDW: 15.1 % (ref 11.5–15.5)
WBC: 7 K/uL (ref 4.0–10.5)

## 2015-02-18 LAB — GLUCOSE, CAPILLARY
Glucose-Capillary: 176 mg/dL — ABNORMAL HIGH (ref 65–99)
Glucose-Capillary: 174 mg/dL — ABNORMAL HIGH (ref 65–99)

## 2015-02-18 LAB — C-REACTIVE PROTEIN: CRP: <0.5 mg/dL (ref <1.0)

## 2015-02-18 LAB — SEDIMENTATION RATE: Sed Rate: 58 mm/hr — ABNORMAL HIGH (ref 0–22)

## 2015-02-18 MED ORDER — AZITHROMYCIN 500 MG PO TABS: 500.0000 mg | ORAL_TABLET | Freq: Every day | ORAL | Status: DC

## 2015-02-18 NOTE — Progress Notes (Signed)
Subjective: [name] reported feeling very good today and ready to go home. She has been walking longer distances accompanied by her nurse without experiencing shortness of breath and desating. She seemed concerned about the findings from her CXR ("the spots in my lungs"). Team reassured her that eventually these opacities will go away with treatment.   Objective: Vital signs in last 24 hours: Filed Vitals:   02/17/15 2041 02/17/15 2211 02/18/15 0022 02/18/15 0552  BP: 137/63 148/63  159/74  Pulse: 61  60 61  Temp: 98.1 F (36.7 C)   98.3 F (36.8 C)  TempSrc: Oral   Oral  Resp: 18  18 18   Height:      Weight:    129.184 kg (284 lb 12.8 oz)  SpO2: 100%   92%   Weight change: 0.272 kg (9.6 oz)  Intake/Output Summary (Last 24 hours) at 02/18/15 1257 Last data filed at 02/18/15 1111  Gross per 24 hour  Intake    600 ml  Output      0 ml  Net    600 ml   Physical Exam: General: well nourished female, lying comfortably in bed in NAD  CV: RRR, 3/6 crescendo-decrescendo systolic murmur that radiates to carotids consistent with aortic stenosis Pulm: CTAB, good air movement, no increased work of breathing  Abd: soft, non-tender, non-distended, BS+ Ext: warm and well perfused, 1+ pitting edema on both lower extremities Neuro: A&O x4, no focal deficits   Lab Results:  CBC    Component Value Date/Time   WBC 7.0 02/18/2015 0341   RBC 3.52* 02/18/2015 0341   RBC 3.59* 04/05/2008 2220   HGB 9.3* 02/18/2015 0341   HCT 30.0* 02/18/2015 0341   PLT 216 02/18/2015 0341   MCV 85.2 02/18/2015 0341   MCH 26.4 02/18/2015 0341   MCHC 31.0 02/18/2015 0341   RDW 15.1 02/18/2015 0341   LYMPHSABS 2.3 02/18/2015 0341   MONOABS 0.5 02/18/2015 0341   EOSABS 0.3 02/18/2015 0341   BASOSABS 0.0 02/18/2015 0341   CMP Latest Ref Rng 02/18/2015 02/17/2015 02/16/2015  Glucose 65 - 99 mg/dL 237(H) 150(H) 189(H)  BUN 6 - 20 mg/dL 40(H) 43(H) 42(H)  Creatinine 0.44 - 1.00 mg/dL 1.52(H) 1.49(H) 1.66(H)    Sodium 135 - 145 mmol/L 138 139 138  Potassium 3.5 - 5.1 mmol/L 4.0 4.0 3.9  Chloride 101 - 111 mmol/L 105 107 105  CO2 22 - 32 mmol/L 23 23 23   Calcium 8.9 - 10.3 mg/dL 8.4(L) 8.5(L) 8.4(L)  Total Protein 6.5 - 8.1 g/dL 6.5 - -  Total Bilirubin 0.3 - 1.2 mg/dL 0.3 - -  Alkaline Phos 38 - 126 U/L 104 - -  AST 15 - 41 U/L 22 - -  ALT 14 - 54 U/L 22 - -    CRP: <0.5  ESR: 58  Micro Results: Recent Results (from the past 240 hour(s))  Culture, blood (routine x 2)     Status: None (Preliminary result)   Collection Time: 02/15/15 12:00 PM  Result Value Ref Range Status   Specimen Description BLOOD LEFT HAND  Final   Special Requests BOTTLES DRAWN AEROBIC AND ANAEROBIC 8CC  Final   Culture NO GROWTH 2 DAYS  Final   Report Status PENDING  Incomplete  Culture, blood (routine x 2)     Status: None (Preliminary result)   Collection Time: 02/15/15 12:07 PM  Result Value Ref Range Status   Specimen Description BLOOD RIGHT HAND  Final   Special Requests  BOTTLES DRAWN AEROBIC AND ANAEROBIC 2CC  Final   Culture NO GROWTH 2 DAYS  Final   Report Status PENDING  Incomplete  Culture, expectorated sputum-assessment     Status: None   Collection Time: 02/17/15 12:37 PM  Result Value Ref Range Status   Specimen Description SPUTUM  Final   Special Requests NONE  Final   Sputum evaluation   Final    MICROSCOPIC FINDINGS SUGGEST THAT THIS SPECIMEN IS NOT REPRESENTATIVE OF LOWER RESPIRATORY SECRETIONS. PLEASE RECOLLECT. Results Called to: T LIPTON,RN AT 1340 02/17/15 BY L BENFIELD    Report Status 02/17/2015 FINAL  Final   Studies/Results: Ct Chest Wo Contrast  02/16/2015   CLINICAL DATA:  Dyspnea and chest pain since Thursday.Hx: CHF, DM, HTN, Hep C, Cholecystectomy  EXAM: CT CHEST WITHOUT CONTRAST  TECHNIQUE: Multidetector CT imaging of the chest was performed following the standard protocol without IV contrast.  COMPARISON:  Chest radiograph, 02/15/2015.  FINDINGS: Thoracic inlet: No mass or  adenopathy. Visualized thyroid is unremarkable.  Mediastinum and hila: Heart mildly enlarged. Mild coronary artery calcifications. Great vessels are normal in caliber. No mediastinal or hilar masses or pathologically enlarged lymph nodes.  Lungs and pleura: Subtle areas of ground-glass opacity in the right upper lobe, minimally in the left upper lobe and in both lower lobes. There has linear and reticular opacity in both lower lobes consistent with subsegmental atelectasis. No lung mass or suspicious nodule. No pleural effusion. No pneumothorax.  Limited upper abdomen: Status post cholecystectomy. Otherwise unremarkable.  Musculoskeletal: No discrete osteoblastic or osteolytic lesions. Small probable hemangioma in the T7 vertebrae. Degenerative changes noted along the mid to lower thoracic spine.  IMPRESSION: 1. There are subtle areas of hazy ground-glass opacity in a patchy distribution most evident in the lower lobes and right upper lobe. This is nonspecific. It may reflect multifocal areas of infection or inflammation. There is no convincing pulmonary edema. 2. Additional linear and reticular opacities in the lower lobes consistent with subsegmental atelectasis.   Electronically Signed   By: Lajean Manes M.D.   On: 02/16/2015 16:43   Medications: I have reviewed the patient's current medications. Scheduled Meds: . amLODipine  10 mg Oral Daily  . aspirin EC  325 mg Oral Daily  . atorvastatin  20 mg Oral Daily  . azithromycin  500 mg Oral Daily  . carvedilol  25 mg Oral BID WC  . enoxaparin (LOVENOX) injection  60 mg Subcutaneous Q24H  . famotidine  20 mg Oral BID  . ferrous sulfate  325 mg Oral BID WC  . furosemide  40 mg Oral BID  . gabapentin  600 mg Oral QHS  . hydrALAZINE  10 mg Oral BID  . insulin aspart  0-5 Units Subcutaneous QHS  . insulin aspart  0-9 Units Subcutaneous TID WC  . insulin glargine  20 Units Subcutaneous QHS  . metoCLOPramide  10 mg Oral TID AC  . sodium chloride  3 mL  Intravenous Q12H  . valACYclovir  500 mg Oral Daily   Continuous Infusions: None  PRN Meds:.acetaminophen **OR** acetaminophen, albuterol, nitroGLYCERIN, polyethylene glycol Assessment/Plan:  Acute respiratory failure with hypoxia: Patient presented with acute onset of SOB associated with chest pain that started on Thursday, four days of chills and no fever. She was noted to have SpO2 80% in ED that improved to 99% on 2L via [place] and use of albuterol. CXR in ED showed a nonspecific mild patchy RLL opacity (atelectasis vs. PNA) and no pulmonary edema. ECG  was unremarkable, troponin negative x3, and BNP 77.6. D-dimer on 8/6 was negative and Echo on 8/6 showed EF 55-60%, normal LV function, LA and RV mild dilation and mitral valve annulus calcifications. Differential diagnosis includes PE given the sudden onset of SOB (however, Wells score is 0 and D-dimer neg), CAP given chills and CXR findings (no fever, no leukocytosis, no sick contacts), CHF exacerbation given orthopnea and bilateral LLE edema (normal BNP, lungs sound dry on exam, unremarkable TTE), untreated OSA given that patient just started using CPAP on 8/5, symptomatic aortic stenosis given her dyspnea and angina (stenosis considered mild on TTE performed in March 2016 and no stenosis found on 8/6 TTE, so less likely to be the cause of her symptoms), and asthma exacerbation given that SOB seemed to be relieved with albuterol and history of asthma.  - Recheck O2 sats while ambulating today  - Continue Azithromycin 537m daily for atypical pneumonia  - Continue Albuterol q6hrs prn - Discontinued Ceftriaxone - Legionella UA pending  - Strep pneumoniae UA pending  - Sputum culture pending - Blood cultures: NGTD  Chest Pain on Exertion: Patient reports CP is associated with DOE that started on Thursday. Pain is not reproducible on exam and not associated with nausea, vomiting, and diaphoresis. This could be a presentation of ACS. However, ECG  was unremarkable and troponins negative x3. Other possible etiologies include PE (Wells score 0, D-dimer neg) and PNA.  - Nitroglycerin prn -TTE results described above  - PE work-up as described above   Obstructive Sleep Apnea: Recently diagnosed. Patient reported participating in a sleep study on 6/29 where she was diagnosed with OSA. Per study report, the average O2 sat was 92%.The lowest O2 sat was 74%.The time spent with oxygen saturations <88% was 8.6 minutes. It was concluded that patient has moderate OSA/hypopnea syndrome with an AHI of 18 events per hour.Most events occurred during REM sleep in the supine position. They recommended ResMed CPAP with heated humidity, C flex of 3 and small Resmed Airfit F-10 full face mask with CPAP set at 11cm H2O, proper sleep hygiene, weight reduction for elevated BMI, avoidance of sleeping in the supine position and avoidance of alcohol within four hours of bedtime. - CPAP started on 8/4   T2DM: Glucose of 185 on admission. - Lantus 20U at bedime with SSI  Gastroparesis: stable. No new complaints of nausea or vomiting  - Continue home Reglan 134mTID before meals   HTN: stable   - Continue home Amlodipine 1062maily - Continue home Carvedilol 68m40mD  - Consider further management for BP as outpatient   Hepatitis C: Per clinic note on 7/21, patient was diagnosed in 2014. HCV RNA quantitative elevated ([id number] HCV antibody reactive on 7/21. LFTs normal on 7/21. Patient currently asymptomatic.  - Continue Valacyclovir 500mg6mly  - Patient currently waiting for clinic to contact her regarding a treatment plan   CKD (chronic kidney disease) stage 3, GFR 30-59 ml/min - Cr. near baseline of 1.4 (1.52 on 8/8)  Dispo: Patient being discharged home today 02/18/2015.   The patient does have a current PCP (JosalBoykin Nearing and [facility].  The patient does not have  transportation limitations that hinder transportation to clinic appointments.    LOS: 3 days   IdalyWelford Roche Student 02/18/2015, 12:57 PM

## 2015-02-18 NOTE — Progress Notes (Signed)
Patient ID: [name], female   DOB: 02-Mar-1956, 59 y.o.   MRN: [id number]   Subjective: [name] reports that her shortness of breath is much improved. She was able to walk up and down the hallway without getting short of breath. She says she is ready to go home today. She says she did not use her CPAP last night because it made her sneeze. She provided some additional history, reporting some swelling in her fingers, but she denies any rashes. She reports the swelling is no worse in the morning.  Objective: Vital signs in last 24 hours: Filed Vitals:   02/18/15 0022 02/18/15 0552 02/18/15 1100 02/18/15 1400  BP:  159/74  136/50  Pulse: 60 61  72  Temp:  98.3 F (36.8 C)  98.3 F (36.8 C)  TempSrc:  Oral  Oral  Resp: 18 18  18   Height:      Weight:  284 lb 12.8 oz (129.184 kg)    SpO2:  92% 95% 99%   General: Resting comfortably in bed and smiling HEENT: no scleral icterus Cardiac: Regular rate and rhythm, 2/6 systolic murmur loudest over Erb's space, normal S1/S2. 1+ pitting edema on bilateral lower extremities Pulm: She is breathing comfortably. Some expiratory wheezing in right lower lung base. No inspiratory crackles. Abd: soft, nontender, nondistended, BS present Ext: warm and well perfused, no pedal edema. No skin lesions.  Lab Results: D-dimer negative  Micro Results: Blood cultures negative x 24 hours  Studies/Results:  Ct Chest Wo Contrast 02/16/2015  IMPRESSION: 1. There are subtle areas of hazy ground-glass opacity in a patchy distribution most evident in the lower lobes and right upper lobe. This is nonspecific. It may reflect multifocal areas of infection or inflammation. There is no convincing pulmonary edema. 2. Additional linear and reticular opacities in the lower lobes consistent with subsegmental atelectasis.   Electronically Signed   By: Lajean Manes M.D.   On: 02/16/2015 16:43   Medications: I have reviewed the patient's current medications. Scheduled  Meds: . amLODipine  10 mg Oral Daily  . aspirin EC  325 mg Oral Daily  . atorvastatin  20 mg Oral Daily  . azithromycin  500 mg Oral Daily  . carvedilol  25 mg Oral BID WC  . enoxaparin (LOVENOX) injection  60 mg Subcutaneous Q24H  . famotidine  20 mg Oral BID  . ferrous sulfate  325 mg Oral BID WC  . furosemide  40 mg Oral BID  . gabapentin  600 mg Oral QHS  . hydrALAZINE  10 mg Oral BID  . insulin aspart  0-5 Units Subcutaneous QHS  . insulin aspart  0-9 Units Subcutaneous TID WC  . insulin glargine  20 Units Subcutaneous QHS  . metoCLOPramide  10 mg Oral TID AC  . sodium chloride  3 mL Intravenous Q12H  . valACYclovir  500 mg Oral Daily   Continuous Infusions:  PRN Meds:.acetaminophen **OR** acetaminophen, albuterol, nitroGLYCERIN, polyethylene glycol   Assessment/Plan: Acute Respiratory Failure with Hypoxia 2/2 Atypical Pneumonia:  [name] has significantly improved on oral azithromycin. Also, while nonspecific, chest CT does suggest a multifocal pneumonia. PE workup, ACS workup, and HF workup (Echo, BNP) unremarkable and cannot explain symptoms. - Will Discharge on a two day course of azithromycin 500 mg two complete a 5 day course  Hepatitis C: Recently diagnosed 3 weeks ago after IV drug abuse. No signs suggestive of cirrhosis or other extrahepatic manifestations. LFTs normal -She is waiting for a call  back from clinic to discuss treatment options  Chronic kidney disease, stage 3: Creatine 1.66->1.49 today.  Obstructive sleep apnea/Pickwickian syndrome: Diagnosed in June. -On home CPAP settings  Grade 2 Diastolic Heart Failure:  Echo for this hospitalization showed grade 2 diastolic dysfunction, a change from grade 1 seen earlier this year. Chest CT showed no pulmonary edema, and BNP was normal. - continue home 40 mg furosemide BID  Hypertension: Non-issue while inpatient -Continue home dose of amlodipine 10mg  dailycarvedilol 25mg  BID, hydralazine 10 mg BID  Type 2  diabetes mellitus: Well-controlled on home dose. Glu 150 today. -Continue Lantus 20U qhs with SSI  Gastroparesis: Non-issue while inpatient -Continue home dose metoclopramide 10mg  TID  Dispo: Disposition is deferred at this time, awaiting improvement of current medical problems.  Anticipated discharge today.   The patient does have a current PCP (Boykin Nearing, MD) and does not need an [facility] hospital follow-up appointment after discharge.  The patient does not know have transportation limitations that hinder transportation to clinic appointments.  .Services Needed at time of discharge: Y = Yes, Blank = No PT:   OT:   RN:   Equipment:   Other:     LOS: 3 days   Liberty Handy, MD 02/18/2015, 2:21 PM

## 2015-02-18 NOTE — Discharge Instructions (Signed)
[name], we treated in the hospital for a pneumonia. To complete the treatment at home, you must pick up a Zithromax prescription. You should take it as 1 pill per day for two more days. Return to the hospital if:  1. You have severe chest pain or shortness of breath.  2. Your temperature is greater than 100.4 degrees.  Also, be sure to pick up the CPAP that you were scheduled to pick up previously. This will help you sleep at night.  Pneumonia Pneumonia is an infection of the lungs.  CAUSES Pneumonia may be caused by bacteria or a virus. Usually, these infections are caused by breathing infectious particles into the lungs (respiratory tract). SIGNS AND SYMPTOMS   Cough.  Fever.  Chest pain.  Increased rate of breathing.  Wheezing.  Mucus production. DIAGNOSIS  If you have the common symptoms of pneumonia, your health care provider will typically confirm the diagnosis with a chest X-Ingrum. The X-[place] will show an abnormality in the lung (pulmonary infiltrate) if you have pneumonia. Other tests of your blood, urine, or sputum may be done to find the specific cause of your pneumonia. Your health care provider may also do tests (blood gases or pulse oximetry) to see how well your lungs are working. TREATMENT  Some forms of pneumonia may be spread to other people when you cough or sneeze. You may be asked to wear a mask before and during your exam. Pneumonia that is caused by bacteria is treated with antibiotic medicine. Pneumonia that is caused by the influenza virus may be treated with an antiviral medicine. Most other viral infections must run their course. These infections will not respond to antibiotics.  HOME CARE INSTRUCTIONS   Cough suppressants may be used if you are losing too much rest. However, coughing protects you by clearing your lungs. You should avoid using cough suppressants if you can.  Your health care provider may have prescribed medicine if he or she thinks your  pneumonia is caused by bacteria or influenza. Finish your medicine even if you start to feel better.  Your health care provider may also prescribe an expectorant. This loosens the mucus to be coughed up.  Take medicines only as directed by your health care provider.  Do not smoke. Smoking is a common cause of bronchitis and can contribute to pneumonia. If you are a smoker and continue to smoke, your cough may last several weeks after your pneumonia has cleared.  A cold steam vaporizer or humidifier in your room or home may help loosen mucus.  Coughing is often worse at night. Sleeping in a semi-upright position in a recliner or using a couple pillows under your head will help with this.  Get rest as you feel it is needed. Your body will usually let you know when you need to rest. PREVENTION A pneumococcal shot (vaccine) is available to prevent a common bacterial cause of pneumonia. This is usually suggested for:  People over 61 years old.  Patients on chemotherapy.  People with chronic lung problems, such as bronchitis or emphysema.  People with immune system problems. If you are over 65 or have a high risk condition, you may receive the pneumococcal vaccine if you have not received it before. In some countries, a routine influenza vaccine is also recommended. This vaccine can help prevent some cases of pneumonia.You may be offered the influenza vaccine as part of your care. If you smoke, it is time to quit. You may receive instructions on  how to stop smoking. Your health care provider can provide medicines and counseling to help you quit. SEEK MEDICAL CARE IF: You have a fever. SEEK IMMEDIATE MEDICAL CARE IF:   Your illness becomes worse. This is especially true if you are elderly or weakened from any other disease.  You cannot control your cough with suppressants and are losing sleep.  You begin coughing up blood.  You develop pain which is getting worse or is uncontrolled with  medicines.  Any of the symptoms which initially brought you in for treatment are getting worse rather than better.  You develop shortness of breath or chest pain. MAKE SURE YOU:   Understand these instructions.  Will watch your condition.  Will get help right away if you are not doing well or get worse. Document Released: 06/29/2005 Document Revised: 11/13/2013 Document Reviewed: 09/18/2010 [facility] Patient Information 2015 [place], [place]. This information is not intended to replace advice given to you by your health care provider. Make sure you discuss any questions you have with your health care provider.

## 2015-02-18 NOTE — Discharge Summary (Signed)
Name: [name] MRN: [id number] DOB: 10-23-1955 59 y.o. PCP: Boykin Nearing, MD  Date of Admission: 02/15/2015  8:44 AM Date of Discharge: 02/18/2015 Attending Physician: Bartholomew Crews, MD  Discharge Diagnosis: Atypical Pneumonia   Discharge Medications:   Medication List    TAKE these medications        albuterol 108 (90 BASE) MCG/ACT inhaler  Commonly known as:  PROAIR HFA  Inhale 2 puffs into the lungs every 4 (four) hours as needed for wheezing.     amLODipine 10 MG tablet  Commonly known as:  NORVASC  Take 1 tablet (10 mg total) by mouth daily.     aspirin 325 MG tablet  Take 1 tablet (325 mg total) by mouth daily.     atorvastatin 20 MG tablet  Commonly known as:  LIPITOR  Take 1 tablet (20 mg total) by mouth daily.     azithromycin 500 MG tablet  Commonly known as:  ZITHROMAX  Take 1 tablet (500 mg total) by mouth daily.     carvedilol 25 MG tablet  Commonly known as:  COREG  Take 1 tablet (25 mg total) by mouth 2 (two) times daily with a meal.     ferrous sulfate 325 (65 FE) MG tablet  Take 1 tablet (325 mg total) by mouth 2 (two) times daily with a meal.     furosemide 40 MG tablet  Commonly known as:  LASIX  Take 1 tablet (40 mg total) by mouth 2 (two) times daily. 8 AM and 2 PM     gabapentin 300 MG capsule  Commonly known as:  NEURONTIN  Take 1-2 capsules (300-600 mg total) by mouth at bedtime.     glucose blood test strip  Commonly known as:  TRUE METRIX BLOOD GLUCOSE TEST  1 each by Other route 3 (three) times daily.     hydrALAZINE 10 MG tablet  Commonly known as:  APRESOLINE  Take 1 tablet (10 mg total) by mouth 2 (two) times daily. NEED OV.     insulin aspart 100 UNIT/ML injection  Commonly known as:  novoLOG  Inject 12 Units into the skin 3 (three) times daily before meals.     Insulin Glargine 100 UNIT/ML Solostar Pen  Commonly known as:  LANTUS SOLOSTAR  Inject 42 Units into the skin daily at 10 pm.     lisinopril 40 MG  tablet  Commonly known as:  PRINIVIL,ZESTRIL  Take 1 tablet (40 mg total) by mouth daily.     metoCLOPramide 10 MG tablet  Commonly known as:  REGLAN  Take 1 tablet (10 mg total) by mouth 3 (three) times daily before meals.     potassium chloride 10 MEQ tablet  Commonly known as:  K-DUR  Take 1 tablet (10 mEq total) by mouth daily.     ranitidine 150 MG tablet  Commonly known as:  ZANTAC  Take 1 tablet (150 mg total) by mouth 2 (two) times daily.     TRUE METRIX METER W/DEVICE Kit  1 each by Does not apply route as needed.     valACYclovir 500 MG tablet  Commonly known as:  VALTREX  Take 1 tablet (500 mg total) by mouth daily.        Disposition and follow-up:   [name] was discharged from [facility] in Good condition.  At the hospital follow up visit please address:  1.  She was treated in the hospital for an atypical pneumonia  and discharged on azithromycin to finish her course for two days. If her symptoms persist, perhaps consider Hepatitis-associated lung disease.  2.  Labs / imaging needed at time of follow-up: None  3.  Pending labs/ test needing follow-up: None  Follow-up Appointments:     Follow-up Information    Follow up with Minerva Ends, MD. Go in 1 week.   Specialty:  Family Medicine   Why:  [facility] Followup.   Contact information:   [address] [place] [id number] [phone]       Discharge Instructions: Discharge Instructions    Call MD for:  difficulty breathing, headache or visual disturbances    Complete by:  As directed      Call MD for:  extreme fatigue    Complete by:  As directed      Call MD for:  hives    Complete by:  As directed      Call MD for:  persistant dizziness or light-headedness    Complete by:  As directed      Call MD for:  persistant nausea and vomiting    Complete by:  As directed      Call MD for:  redness, tenderness, or signs of infection (pain, swelling, redness,  odor or green/yellow discharge around incision site)    Complete by:  As directed      Call MD for:  severe uncontrolled pain    Complete by:  As directed      Call MD for:  temperature >100.4    Complete by:  As directed      Call MD for:    Complete by:  As directed      Diet - low sodium heart healthy    Complete by:  As directed      Increase activity slowly    Complete by:  As directed            Consultations:    Procedures Performed:  Dg Chest 2 View  02/15/2015   CLINICAL DATA:  Chest pain, shortness of breath and cough.  EXAM: CHEST  2 VIEW  COMPARISON:  10/02/2014 chest radiograph  FINDINGS: Stable cardiomediastinal silhouette, with top normal heart size. No pneumothorax. No pleural effusion. There is mild patchy opacity in the right lower lobe. There is no overt pulmonary edema. Minimal degenerative changes are seen in the thoracic spine. Surgical clips are noted in the upper abdomen.  IMPRESSION: 1. Nonspecific mild patchy right lower lobe opacity, either atelectasis or developing pneumonia. 2. Stable top-normal heart size.  No overt pulmonary edema.   Electronically Signed   By: Ilona Sorrel M.D.   On: 02/15/2015 09:37   Ct Chest Wo Contrast  02/16/2015   CLINICAL DATA:  Dyspnea and chest pain since Thursday.Hx: CHF, DM, HTN, Hep C, Cholecystectomy  EXAM: CT CHEST WITHOUT CONTRAST  TECHNIQUE: Multidetector CT imaging of the chest was performed following the standard protocol without IV contrast.  COMPARISON:  Chest radiograph, 02/15/2015.  FINDINGS: Thoracic inlet: No mass or adenopathy. Visualized thyroid is unremarkable.  Mediastinum and hila: Heart mildly enlarged. Mild coronary artery calcifications. Great vessels are normal in caliber. No mediastinal or hilar masses or pathologically enlarged lymph nodes.  Lungs and pleura: Subtle areas of ground-glass opacity in the right upper lobe, minimally in the left upper lobe and in both lower lobes. There has linear and reticular  opacity in both lower lobes consistent with subsegmental atelectasis. No lung mass or suspicious  nodule. No pleural effusion. No pneumothorax.  Limited upper abdomen: Status post cholecystectomy. Otherwise unremarkable.  Musculoskeletal: No discrete osteoblastic or osteolytic lesions. Small probable hemangioma in the T7 vertebrae. Degenerative changes noted along the mid to lower thoracic spine.  IMPRESSION: 1. There are subtle areas of hazy ground-glass opacity in a patchy distribution most evident in the lower lobes and right upper lobe. This is nonspecific. It may reflect multifocal areas of infection or inflammation. There is no convincing pulmonary edema. 2. Additional linear and reticular opacities in the lower lobes consistent with subsegmental atelectasis.   Electronically Signed   By: Lajean Manes M.D.   On: 02/16/2015 16:43    2D Echo: Grade 2 Diastolic Dysfunction  Admission HPI: Ms. Cohoon is a 59 year old woman with a PMH of with T2DM, as below who presents with dyspnea on exertion, chest pain and chills for four day. Describes CP while doing water aerobics yesterday, stopped with rest. She notices it worsens on exertion, and for the for the past few days has been unable to go to the gym. The CP is radiating to left shoulder. She says it feels like muscle pain. She has had a cough, but only coughed up a little yellow sputum 3-4 days ago. She denies any nausea, vomiting, calf pain, new or worsening leg swelling, hemoptysis, drug use, or sick contacts. She wanted to visit her daughter in [place] this morning, but was unable to because of her symptoms. Of note, she was noted by a sleep study to have OSA, was supposed to pick of a CPAP but has not done so. She reports a similar episode last March that had a workup that included an echo, remarkable for only mild aortic stenosis and grade 1 diastolic dysfunction. EF was 65-70%. During that admission her BNP was somewhat elevated to 122.5, and her  symptoms were attributed to a combination of community-acquired pneumonia and acute on chronic heart failure. In the ED, she was found to have O2 sats in the 80s, but improved to 99 after albuterol. Subjectively, her symptoms also improved. I-stat trops were negative.   Hospital Course by problem list: Phaedra Colgate is a 59yo female with a PMH of HTN, HLD, DM type II, HFpEF (EF 55-60%), CKD stage 3, and OSA who presented with acute onset of dyspnea on exertion associated with chest pain.   Acute respiratory failure with hypoxia: Patient presented with acute onset of shortness of breath associated with chest pain that started on 8/4, four days of chills and no fever. She was noted to have SpO2 80% in ED that improved to 99% on 2L via [place] and use of albuterol. CXR showed a nonspecific mild patchy RLL opacity (atelectasis vs. PNA) and no pulmonary edema. She received 1g Ceftriaxone in ED. CT  w/o contrast on 8/6 showed nonspecific subtle areas of hazy ground glass opacity in a patchy distribution most evident in the lower lobes and right upper lobe  and no pulmonary edema. ECG was unremarkable, troponin negative x3, BNP 77.6, D-dimer negative, and blood cultures showed no growth. Echo on 8/6 showed EF 55-60%, normal LV function, LA and RV mild dilation and mitral valve calcifications. During hospitalization patient SpO2 decreased to 80s when ambulating until 8/8 when she was able to maintain O2 sats of 93% while ambulating. Given patient's presenting symptoms and test/imaging results PE, ACS, and HF exacerbation were considered to be less likely etiologies. Ceftriaxone was discontinued on 8/7 given that there was no concern for bacterial  pneumonia and she was started on Azithromycin 583m on 8/6 for atypical pneumonia. She was discharge on 02/18/15, and told to complete her course of azithromycin on 8/10 for two more days. [place]  Chest Pain on  Exertion: Patient reported CP was associated with dyspnea on exertion that started on 8/4. Pain was not reproducible on exam and not associated with nausea, vomiting, or diaphoresis. Cardiac work-up unremarkable, as described above. Patient was provided with Nitroglycerin as needed during hospitalization, but not requested. It is likely that chest pain is related to underlying pneumonia.  Obstructive sleep apnea: Recently diagnosed. Patient reported participating in a sleep study on 6/29 where she was diagnosed with OSA. Per study report, the average O2 sat was 92%. The lowest O2 sat was 74%. The time spent with oxygen saturations <88% was 8.6 minutes. It was concluded that patient has moderate OSA/hypopnea syndrome with an AHI of 18 events per hour. She was started on CPAP on 8/6 and reported improvement of symptoms. She had previously be eligible to pick up a CPAP, and it was recommended to her to pick it up upon discharge.  Hepatitis C: Per clinic note on 7/21, patient was diagnosed in 2014. HCV RNA quantitative elevated (([id number] and HCV antibody reactive on 7/21. LFTs normal on 7/21. LFTs normal during hospitalization and patient remained asymptomatic. She was started on Valacyclovir 5065maily on 8/5. She is currently waiting to be contacted by clinic to determine treatment plan.   Insulin dependent T2DM: Was stable during hospitalization (170s-220s). Lantus 20 qHS was continued with sliding scale insulin. Was discharged on home regimen of Lantus 42U at night, novolog 12U TID.  Gastroparesis: patient did not report any complaints associated with gastroparesis. Her home dose Reglan 1047mID was continued.   HTN: Patient's systolic blood pressure fluctuated between 139-159. Home Amlodipine 22m64mily and Carvedilol 25mg11m continued. Hydralazine 10 mg BID as well. She was discharged on her home medications  CKD stage 3: Patient's baseline creatinine is 1.4. During hospitalization, the highest Cr was  1.66 on 8/6. Cr 1.52 on day of discharge.     Discharge Vitals:   BP 136/50 mmHg  Pulse 72  Temp(Src) 98.3 F (36.8 C) (Oral)  Resp 18  Ht _0  (1.727 m)  Wt 284 lb 12.8 oz (129.184 kg)  BMI 43.31 kg/m2  SpO2 99%  Discharge Labs:  Results for orders placed or performed during the hospital encounter of 02/15/15 (from the past 24 hour(s))  Glucose, capillary     Status: Abnormal   Collection Time: 02/17/15  4:21 PM  Result Value Ref Range   Glucose-Capillary 171 (H) 65 - 99 mg/dL  Glucose, capillary     Status: Abnormal   Collection Time: 02/17/15  8:52 PM  Result Value Ref Range   Glucose-Capillary 223 (H) 65 - 99 mg/dL  CBC with Differential/Platelet     Status: Abnormal   Collection Time: 02/18/15  3:41 AM  Result Value Ref Range   WBC 7.0 4.0 - 10.5 K/uL   RBC 3.52 (L) 3.87 - 5.11 MIL/uL   Hemoglobin 9.3 (L) 12.0 - 15.0 g/dL   HCT 30.0 (L) 36.0 - 46.0 %   MCV 85.2 78.0 - 100.0 fL   MCH 26.4 26.0 - 34.0 pg   MCHC 31.0 30.0 - 36.0 g/dL   RDW 15.1 11.5 - 15.5 %   Platelets 216 150 - 400 K/uL  Neutrophils Relative % 57 43 - 77 %   Neutro Abs 4.0 1.7 - 7.7 K/uL   Lymphocytes Relative 32 12 - 46 %   Lymphs Abs 2.3 0.7 - 4.0 K/uL   Monocytes Relative 7 3 - 12 %   Monocytes Absolute 0.5 0.1 - 1.0 K/uL   Eosinophils Relative 4 0 - 5 %   Eosinophils Absolute 0.3 0.0 - 0.7 K/uL   Basophils Relative 0 0 - 1 %   Basophils Absolute 0.0 0.0 - 0.1 K/uL  C-reactive protein     Status: None   Collection Time: 02/18/15  3:41 AM  Result Value Ref Range   CRP <0.5 <1.0 mg/dL  Sedimentation rate     Status: Abnormal   Collection Time: 02/18/15  3:41 AM  Result Value Ref Range   Sed Rate 58 (H) 0 - 22 mm/hr  Comprehensive metabolic panel     Status: Abnormal   Collection Time: 02/18/15  3:41 AM  Result Value Ref Range   Sodium 138 135 - 145 mmol/L   Potassium 4.0 3.5 - 5.1 mmol/L   Chloride 105 101 - 111 mmol/L   CO2 23 22 - 32 mmol/L   Glucose, Bld 237 (H) 65 - 99 mg/dL    BUN 40 (H) 6 - 20 mg/dL   Creatinine, Ser 1.52 (H) 0.44 - 1.00 mg/dL   Calcium 8.4 (L) 8.9 - 10.3 mg/dL   Total Protein 6.5 6.5 - 8.1 g/dL   Albumin 3.0 (L) 3.5 - 5.0 g/dL   AST 22 15 - 41 U/L   ALT 22 14 - 54 U/L   Alkaline Phosphatase 104 38 - 126 U/L   Total Bilirubin 0.3 0.3 - 1.2 mg/dL   GFR calc non Af Amer 37 (L) >60 mL/min   GFR calc Af Amer 43 (L) >60 mL/min   Anion gap 10 5 - 15  Glucose, capillary     Status: Abnormal   Collection Time: 02/18/15  5:50 AM  Result Value Ref Range   Glucose-Capillary 176 (H) 65 - 99 mg/dL  Glucose, capillary     Status: Abnormal   Collection Time: 02/18/15 11:52 AM  Result Value Ref Range   Glucose-Capillary 174 (H) 65 - 99 mg/dL    Signed: Liberty Handy, MD 02/18/2015, 3:12 PM   Equipment Ordered on Discharge: CPAP

## 2015-02-18 NOTE — Care Management Note (Signed)
Case Management Note  Patient Details  Name: [name] MRN: [id number] Date of Birth: 04-26-1956  Subjective/Objective:     Pt admitted with dyspnea               Action/Plan:  Pt is independent from home.  Pt is already a pt with [facility], has had prescriptions filled their before admit.  CM will monitor for disposition needs   Expected Discharge Date:                  Expected Discharge Plan:  Home/Self Care  In-House Referral:     Discharge planning Services  CM Consult  Post Acute Care Choice:    Choice offered to:     DME Arranged:    DME Agency:     HH Arranged:    [place] Agency:     Status of Service:  Completed, signed off  Medicare Important Message Given:    Date Medicare IM Given:    Medicare IM give by:    Date Additional Medicare IM Given:    Additional Medicare Important Message give by:     If discussed at [place] of Stay Meetings, dates discussed:    Additional Comments: CM assessed pt.  Pt stated she is already a pt with [facility], pt understood the need to call for follow up appt post discharge, PCP Josalyn Funches.  No other CM needs Maryclare Labrador, RN 02/18/2015, 3:01 PM

## 2015-02-20 LAB — CULTURE, BLOOD (ROUTINE X 2)
Culture: NO GROWTH
Culture: NO GROWTH

## 2015-02-25 ENCOUNTER — Other Ambulatory Visit: Payer: Self-pay | Admitting: Family Medicine

## 2015-03-14 ENCOUNTER — Ambulatory Visit: Payer: Self-pay | Attending: Family Medicine | Admitting: Family Medicine

## 2015-03-14 ENCOUNTER — Encounter: Payer: Self-pay | Admitting: Family Medicine

## 2015-03-14 VITALS — BP 133/76 | HR 59 | Temp 98.0°F | Resp 16 | Ht 68.0 in | Wt 287.0 lb

## 2015-03-14 DIAGNOSIS — D649 Anemia, unspecified: Secondary | ICD-10-CM | POA: Insufficient documentation

## 2015-03-14 DIAGNOSIS — I5033 Acute on chronic diastolic (congestive) heart failure: Secondary | ICD-10-CM

## 2015-03-14 DIAGNOSIS — E118 Type 2 diabetes mellitus with unspecified complications: Secondary | ICD-10-CM

## 2015-03-14 DIAGNOSIS — Z91048 Other nonmedicinal substance allergy status: Secondary | ICD-10-CM

## 2015-03-14 DIAGNOSIS — E119 Type 2 diabetes mellitus without complications: Secondary | ICD-10-CM

## 2015-03-14 DIAGNOSIS — I10 Essential (primary) hypertension: Secondary | ICD-10-CM | POA: Insufficient documentation

## 2015-03-14 DIAGNOSIS — IMO0002 Reserved for concepts with insufficient information to code with codable children: Secondary | ICD-10-CM

## 2015-03-14 DIAGNOSIS — R0602 Shortness of breath: Secondary | ICD-10-CM | POA: Insufficient documentation

## 2015-03-14 DIAGNOSIS — E1165 Type 2 diabetes mellitus with hyperglycemia: Secondary | ICD-10-CM | POA: Insufficient documentation

## 2015-03-14 DIAGNOSIS — Z87891 Personal history of nicotine dependence: Secondary | ICD-10-CM | POA: Insufficient documentation

## 2015-03-14 DIAGNOSIS — J189 Pneumonia, unspecified organism: Secondary | ICD-10-CM | POA: Insufficient documentation

## 2015-03-14 DIAGNOSIS — B182 Chronic viral hepatitis C: Secondary | ICD-10-CM | POA: Insufficient documentation

## 2015-03-14 DIAGNOSIS — I5032 Chronic diastolic (congestive) heart failure: Secondary | ICD-10-CM | POA: Insufficient documentation

## 2015-03-14 DIAGNOSIS — Z9109 Other allergy status, other than to drugs and biological substances: Secondary | ICD-10-CM | POA: Insufficient documentation

## 2015-03-14 LAB — COMPLETE METABOLIC PANEL WITH GFR
ALT: 22 U/L (ref 6–29)
AST: 20 U/L (ref 10–35)
Albumin: 3.7 g/dL (ref 3.6–5.1)
Alkaline Phosphatase: 158 U/L — ABNORMAL HIGH (ref 33–130)
BUN: 33 mg/dL — ABNORMAL HIGH (ref 7–25)
CO2: 24 mmol/L (ref 20–31)
Calcium: 8.9 mg/dL (ref 8.6–10.4)
Chloride: 108 mmol/L (ref 98–110)
Creat: 1.32 mg/dL — ABNORMAL HIGH (ref 0.50–1.05)
GFR, Est African American: 52 mL/min — ABNORMAL LOW (ref >=60)
GFR, Est Non African American: 45 mL/min — ABNORMAL LOW (ref >=60)
Glucose, Bld: 190 mg/dL — ABNORMAL HIGH (ref 65–99)
Potassium: 4.7 mmol/L (ref 3.5–5.3)
Sodium: 142 mmol/L (ref 135–146)
Total Bilirubin: 0.6 mg/dL (ref 0.2–1.2)
Total Protein: 7.1 g/dL (ref 6.1–8.1)

## 2015-03-14 LAB — CBC
HCT: 29.7 % — ABNORMAL LOW (ref 36.0–46.0)
Hemoglobin: 9.5 g/dL — ABNORMAL LOW (ref 12.0–15.0)
MCH: 26.5 pg (ref 26.0–34.0)
MCHC: 32 g/dL (ref 30.0–36.0)
MCV: 82.7 fL (ref 78.0–100.0)
MPV: 11 fL (ref 8.6–12.4)
Platelets: 227 10*3/uL (ref 150–400)
RBC: 3.59 MIL/uL — ABNORMAL LOW (ref 3.87–5.11)
RDW: 15.4 % (ref 11.5–15.5)
WBC: 6.5 10*3/uL (ref 4.0–10.5)

## 2015-03-14 LAB — GLUCOSE, POCT (MANUAL RESULT ENTRY): POC Glucose: 185 mg/dl — AB (ref 70–99)

## 2015-03-14 MED ORDER — FERROUS SULFATE 325 (65 FE) MG PO TABS: 325.0000 mg | ORAL_TABLET | Freq: Two times a day (BID) | ORAL | Status: DC

## 2015-03-14 MED ORDER — KETOTIFEN FUMARATE 0.025 % OP SOLN: 1.0000 [drp] | Freq: Two times a day (BID) | OPHTHALMIC | Status: DC

## 2015-03-14 MED ORDER — CARVEDILOL 25 MG PO TABS: 25.0000 mg | ORAL_TABLET | Freq: Two times a day (BID) | ORAL | Status: DC

## 2015-03-14 MED ORDER — FUROSEMIDE 40 MG PO TABS: 40.0000 mg | ORAL_TABLET | Freq: Two times a day (BID) | ORAL | Status: DC

## 2015-03-14 MED ORDER — INSULIN GLARGINE 100 UNIT/ML SOLOSTAR PEN: 42.0000 [IU] | PEN_INJECTOR | Freq: Every day | SUBCUTANEOUS | Status: DC

## 2015-03-14 MED ORDER — POTASSIUM CHLORIDE ER 20 MEQ PO TBCR: 20.0000 meq | EXTENDED_RELEASE_TABLET | Freq: Two times a day (BID) | ORAL | Status: DC

## 2015-03-14 NOTE — Progress Notes (Signed)
Subjective:    Patient ID: [name], female    DOB: 1956/01/09, 59 y.o.   MRN: [id number] CC: HFU CAP,  HPI 59 yo F with Hep C, DM2, HTN  1. CAP: went to hospital. Hospitalized from 8/5-02/18/2015. CXR revealed RUL and b/l LL  opacities. This was confirmed on CR chest. Patient treated and sent home with azithromycin x 2 days to complete course.   2. SOB: worsening SOB since hospitalization. She sleeps on 1-2 pillows. She has orthopnea. Leg swelling. No CP. She is taking lasix once daily only. She has gained weight. She is fatigue.   3. Hep C: has hep C w/o hepatitis or cirrhosis. Elevated viral load. Has been referred to ID clinic, referral placed 02/05/15. She has not heard about appt details.   Social History  Substance Use Topics  . Smoking status: Former Smoker    Quit date: 10/25/1980  . Smokeless tobacco: Never Used     Comment: quit smoking in 1982  . Alcohol Use: Yes     Comment: ocassionally   Review of Systems  Constitutional: Positive for fatigue. Negative for fever and chills.  HENT: Positive for facial swelling (around eyes ).   Eyes: Negative for visual disturbance.  Respiratory: Positive for shortness of breath. Negative for chest tightness.   Cardiovascular: Positive for leg swelling. Negative for chest pain.  Gastrointestinal: Negative for abdominal pain and blood in stool.  Musculoskeletal: Negative for back pain and arthralgias.  Skin: Negative for rash.  Allergic/Immunologic: Negative for immunocompromised state.  Hematological: Negative for adenopathy. Does not bruise/bleed easily.  Psychiatric/Behavioral: Negative for suicidal ideas and dysphoric mood.      Objective:   Physical Exam  Constitutional: She is oriented to person, place, and time. She appears well-developed and well-nourished. No distress.  HENT:  Mouth/Throat: Oropharynx is clear and moist.  Eyes: Conjunctivae are normal. Pupils are equal, round, and reactive to light. Right eye  exhibits no discharge. Left eye exhibits no discharge.    Cardiovascular: Normal rate, regular rhythm, normal heart sounds and intact distal pulses.   Pulmonary/Chest: Effort normal and breath sounds normal.  Musculoskeletal: She exhibits edema (2+ LE edema b/l ).  Neurological: She is alert and oriented to person, place, and time.  Skin: Skin is warm and dry. No rash noted.  Psychiatric: She has a normal mood and affect.  BP 133/76 mmHg  Pulse 59  Temp(Src) 98 F (36.7 C) (Oral)  Resp 16  Ht 5\' 8"  (1.727 m)  Wt 287 lb (130.182 kg)  BMI 43.65 kg/m2  SpO2 99% Wt Readings from Last 3 Encounters:  03/14/15 287 lb (130.182 kg)  02/18/15 284 lb 12.8 oz (129.184 kg)  01/31/15 282 lb (127.914 kg)       Assessment & Plan:  Marneisha was seen today for hospitalization follow-up.  Diagnoses and all orders for this visit:  Diabetes mellitus type 2, uncontrolled, with complications -     POCT glucose (manual entry)  Type 2 diabetes mellitus without complication -     Insulin Glargine (LANTUS SOLOSTAR) 100 UNIT/ML Solostar Pen; Inject 42 Units into the skin daily at 10 pm.  Shortness of breath -     Pro b natriuretic peptide  Chronic diastolic CHF (congestive heart failure), NYHA class 2  Acute on chronic diastolic congestive heart failure, NYHA class 2 -     carvedilol (COREG) 25 MG tablet; Take 1 tablet (25 mg total) by mouth 2 (two) times daily with a meal. -  furosemide (LASIX) 40 MG tablet; Take 1 tablet (40 mg total) by mouth 2 (two) times daily. 8 AM and 2 PM -     COMPLETE METABOLIC PANEL WITH GFR -     potassium chloride 20 MEQ TBCR; Take 20 mEq by mouth 2 (two) times daily. With lasix -     CBC  Hep C w/o coma, chronic -     Ambulatory referral to Infectious Disease  CAP (community acquired pneumonia) -     DG Chest 2 View; Future  Anemia, unspecified anemia type -     ferrous sulfate 325 (65 FE) MG tablet; Take 1 tablet (325 mg total) by mouth 2 (two) times daily  with a meal.  Environmental allergies -     ketotifen (ZADITOR) 0.025 % ophthalmic solution; Place 1 drop into both eyes 2 (two) times daily.

## 2015-03-14 NOTE — Patient Instructions (Addendum)
Ms. Mutchler,  Thank you for coming in today  1. Recent pneumonia: treated. Repeat CXR will be obtained in early October, ordered please go on 04/17/2015   2. Worsening shortness of breath:  Increase lasix to 40 mg twice daily Increase potassium Checking proBNP  3. Hep C: I placed another referral to ID clinic today  4. Anemia: be sure to take iron as you are still anemic   F/u for flu shot in September  F/u with me in 10-14 days for shortness of breath/CHF   Dr. Adrian Blackwater

## 2015-03-14 NOTE — Progress Notes (Signed)
HFU Pneumonia, stated was told per ER has spot on lungs  Problem with asthma, SHOB and wheezing. Eye swelling sue to seasonal allergies

## 2015-03-15 LAB — PRO B NATRIURETIC PEPTIDE: Pro B Natriuretic peptide (BNP): 187.9 pg/mL — ABNORMAL HIGH (ref <126)

## 2015-03-18 ENCOUNTER — Encounter (HOSPITAL_COMMUNITY): Payer: Self-pay | Admitting: Emergency Medicine

## 2015-03-18 ENCOUNTER — Emergency Department (HOSPITAL_COMMUNITY): Payer: Self-pay

## 2015-03-18 ENCOUNTER — Inpatient Hospital Stay (HOSPITAL_COMMUNITY)
Admission: EM | Admit: 2015-03-18 | Discharge: 2015-03-19 | DRG: 202 | Disposition: A | Discharge status: Home / Self Care | Payer: Self-pay | Attending: Internal Medicine | Admitting: Internal Medicine

## 2015-03-18 DIAGNOSIS — R079 Chest pain, unspecified: Secondary | ICD-10-CM

## 2015-03-18 DIAGNOSIS — I5032 Chronic diastolic (congestive) heart failure: Secondary | ICD-10-CM

## 2015-03-18 DIAGNOSIS — J45901 Unspecified asthma with (acute) exacerbation: Principal | ICD-10-CM | POA: Diagnosis present

## 2015-03-18 DIAGNOSIS — Z79899 Other long term (current) drug therapy: Secondary | ICD-10-CM

## 2015-03-18 DIAGNOSIS — J9601 Acute respiratory failure with hypoxia: Secondary | ICD-10-CM | POA: Diagnosis present

## 2015-03-18 DIAGNOSIS — Z87891 Personal history of nicotine dependence: Secondary | ICD-10-CM

## 2015-03-18 DIAGNOSIS — E1122 Type 2 diabetes mellitus with diabetic chronic kidney disease: Secondary | ICD-10-CM | POA: Diagnosis present

## 2015-03-18 DIAGNOSIS — B009 Herpesviral infection, unspecified: Secondary | ICD-10-CM | POA: Diagnosis present

## 2015-03-18 DIAGNOSIS — E118 Type 2 diabetes mellitus with unspecified complications: Secondary | ICD-10-CM

## 2015-03-18 DIAGNOSIS — Z794 Long term (current) use of insulin: Secondary | ICD-10-CM

## 2015-03-18 DIAGNOSIS — R072 Precordial pain: Secondary | ICD-10-CM | POA: Insufficient documentation

## 2015-03-18 DIAGNOSIS — G4733 Obstructive sleep apnea (adult) (pediatric): Secondary | ICD-10-CM | POA: Diagnosis present

## 2015-03-18 DIAGNOSIS — Z7982 Long term (current) use of aspirin: Secondary | ICD-10-CM

## 2015-03-18 DIAGNOSIS — R0602 Shortness of breath: Secondary | ICD-10-CM

## 2015-03-18 DIAGNOSIS — Z885 Allergy status to narcotic agent status: Secondary | ICD-10-CM

## 2015-03-18 DIAGNOSIS — E1100 Type 2 diabetes mellitus with hyperosmolarity without nonketotic hyperglycemic-hyperosmolar coma (NKHHC): Secondary | ICD-10-CM | POA: Diagnosis present

## 2015-03-18 DIAGNOSIS — E1140 Type 2 diabetes mellitus with diabetic neuropathy, unspecified: Secondary | ICD-10-CM | POA: Diagnosis present

## 2015-03-18 DIAGNOSIS — I10 Essential (primary) hypertension: Secondary | ICD-10-CM | POA: Diagnosis present

## 2015-03-18 DIAGNOSIS — E1165 Type 2 diabetes mellitus with hyperglycemia: Secondary | ICD-10-CM | POA: Diagnosis present

## 2015-03-18 DIAGNOSIS — Z91013 Allergy to seafood: Secondary | ICD-10-CM

## 2015-03-18 DIAGNOSIS — B182 Chronic viral hepatitis C: Secondary | ICD-10-CM | POA: Diagnosis present

## 2015-03-18 LAB — CBC WITH DIFFERENTIAL/PLATELET
Basophils Absolute: 0 10*3/uL (ref 0.0–0.1)
Basophils Relative: 0 % (ref 0–1)
Eosinophils Absolute: 0.3 10*3/uL (ref 0.0–0.7)
Eosinophils Relative: 3 % (ref 0–5)
HCT: 30.4 % — ABNORMAL LOW (ref 36.0–46.0)
Hemoglobin: 9.6 g/dL — ABNORMAL LOW (ref 12.0–15.0)
Lymphocytes Relative: 16 % (ref 12–46)
Lymphs Abs: 1.6 10*3/uL (ref 0.7–4.0)
MCH: 26.5 pg (ref 26.0–34.0)
MCHC: 31.6 g/dL (ref 30.0–36.0)
MCV: 84 fL (ref 78.0–100.0)
Monocytes Absolute: 0.5 10*3/uL (ref 0.1–1.0)
Monocytes Relative: 5 % (ref 3–12)
Neutro Abs: 7.9 10*3/uL — ABNORMAL HIGH (ref 1.7–7.7)
Neutrophils Relative %: 76 % (ref 43–77)
Platelets: 240 10*3/uL (ref 150–400)
RBC: 3.62 MIL/uL — ABNORMAL LOW (ref 3.87–5.11)
RDW: 15.2 % (ref 11.5–15.5)
WBC: 10.3 10*3/uL (ref 4.0–10.5)

## 2015-03-18 LAB — CBC
HCT: 31.5 % — ABNORMAL LOW (ref 36.0–46.0)
Hemoglobin: 10 g/dL — ABNORMAL LOW (ref 12.0–15.0)
MCH: 26.7 pg (ref 26.0–34.0)
MCHC: 31.7 g/dL (ref 30.0–36.0)
MCV: 84 fL (ref 78.0–100.0)
Platelets: 228 10*3/uL (ref 150–400)
RBC: 3.75 MIL/uL — ABNORMAL LOW (ref 3.87–5.11)
RDW: 15.2 % (ref 11.5–15.5)
WBC: 8.4 10*3/uL (ref 4.0–10.5)

## 2015-03-18 LAB — I-STAT ARTERIAL BLOOD GAS, ED
Acid-base deficit: 3 mmol/L — ABNORMAL HIGH (ref 0.0–2.0)
Bicarbonate: 22.5 mEq/L (ref 20.0–24.0)
O2 Saturation: 70 %
TCO2: 24 mmol/L (ref 0–100)
pCO2 arterial: 42.6 mmHg (ref 35.0–45.0)
pH, Arterial: 7.331 — ABNORMAL LOW (ref 7.350–7.450)
pO2, Arterial: 39 mmHg — CL (ref 80.0–100.0)

## 2015-03-18 LAB — GLUCOSE, CAPILLARY
Glucose-Capillary: 217 mg/dL — ABNORMAL HIGH (ref 65–99)
Glucose-Capillary: 324 mg/dL — ABNORMAL HIGH (ref 65–99)
Glucose-Capillary: 346 mg/dL — ABNORMAL HIGH (ref 65–99)
Glucose-Capillary: 384 mg/dL — ABNORMAL HIGH (ref 65–99)
Glucose-Capillary: 423 mg/dL — ABNORMAL HIGH (ref 65–99)

## 2015-03-18 LAB — TROPONIN I
Troponin I: 0.03 ng/mL (ref <0.031)
Troponin I: <0.03 ng/mL (ref <0.031)

## 2015-03-18 LAB — COMPREHENSIVE METABOLIC PANEL
ALT: 22 U/L (ref 14–54)
AST: 21 U/L (ref 15–41)
Albumin: 3.3 g/dL — ABNORMAL LOW (ref 3.5–5.0)
Alkaline Phosphatase: 152 U/L — ABNORMAL HIGH (ref 38–126)
Anion gap: 8 (ref 5–15)
BUN: 39 mg/dL — ABNORMAL HIGH (ref 6–20)
CO2: 23 mmol/L (ref 22–32)
Calcium: 8.6 mg/dL — ABNORMAL LOW (ref 8.9–10.3)
Chloride: 109 mmol/L (ref 101–111)
Creatinine, Ser: 1.54 mg/dL — ABNORMAL HIGH (ref 0.44–1.00)
GFR calc Af Amer: 42 mL/min — ABNORMAL LOW (ref >60)
GFR calc non Af Amer: 37 mL/min — ABNORMAL LOW (ref >60)
Glucose, Bld: 264 mg/dL — ABNORMAL HIGH (ref 65–99)
Potassium: 4.3 mmol/L (ref 3.5–5.1)
Sodium: 140 mmol/L (ref 135–145)
Total Bilirubin: 0.5 mg/dL (ref 0.3–1.2)
Total Protein: 7.5 g/dL (ref 6.5–8.1)

## 2015-03-18 LAB — POCT I-STAT 3, ART BLOOD GAS (G3+)
Acid-base deficit: 4 mmol/L — ABNORMAL HIGH (ref 0.0–2.0)
Bicarbonate: 21.1 mEq/L (ref 20.0–24.0)
O2 Saturation: 94 %
Patient temperature: 97.8
TCO2: 22 mmol/L (ref 0–100)
pCO2 arterial: 35.8 mmHg (ref 35.0–45.0)
pH, Arterial: 7.377 (ref 7.350–7.450)
pO2, Arterial: 71 mmHg — ABNORMAL LOW (ref 80.0–100.0)

## 2015-03-18 LAB — MRSA PCR SCREENING: MRSA by PCR: NEGATIVE

## 2015-03-18 LAB — CREATININE, SERUM
Creatinine, Ser: 1.48 mg/dL — ABNORMAL HIGH (ref 0.44–1.00)
GFR calc Af Amer: 45 mL/min — ABNORMAL LOW (ref >60)
GFR calc non Af Amer: 38 mL/min — ABNORMAL LOW (ref >60)

## 2015-03-18 LAB — I-STAT TROPONIN, ED: Troponin i, poc: 0 ng/mL (ref 0.00–0.08)

## 2015-03-18 LAB — BRAIN NATRIURETIC PEPTIDE: B Natriuretic Peptide: 76.1 pg/mL (ref 0.0–100.0)

## 2015-03-18 MED ORDER — IPRATROPIUM-ALBUTEROL 0.5-2.5 (3) MG/3ML IN SOLN
3.0000 mL | RESPIRATORY_TRACT | Status: DC
  Administered 2015-03-18: 3 mL via RESPIRATORY_TRACT
  Filled 2015-03-18: qty 3

## 2015-03-18 MED ORDER — ALBUTEROL (5 MG/ML) CONTINUOUS INHALATION SOLN
10.0000 mg/h | INHALATION_SOLUTION | RESPIRATORY_TRACT | Status: DC
  Administered 2015-03-18: 10 mg/h via RESPIRATORY_TRACT
  Filled 2015-03-18: qty 20

## 2015-03-18 MED ORDER — KETOTIFEN FUMARATE 0.025 % OP SOLN
1.0000 [drp] | Freq: Two times a day (BID) | OPHTHALMIC | Status: DC
  Administered 2015-03-18 – 2015-03-19 (×3): 1 [drp] via OPHTHALMIC
  Filled 2015-03-18 (×2): qty 5

## 2015-03-18 MED ORDER — ACETAMINOPHEN 325 MG PO TABS: 650.0000 mg | ORAL_TABLET | Freq: Four times a day (QID) | ORAL | Status: DC | PRN

## 2015-03-18 MED ORDER — ENOXAPARIN SODIUM 40 MG/0.4ML ~~LOC~~ SOLN
40.0000 mg | SUBCUTANEOUS | Status: DC
  Filled 2015-03-18 (×2): qty 0.4

## 2015-03-18 MED ORDER — LISINOPRIL 40 MG PO TABS
40.0000 mg | ORAL_TABLET | Freq: Every day | ORAL | Status: DC
  Administered 2015-03-18 – 2015-03-19 (×2): 40 mg via ORAL
  Filled 2015-03-18 (×2): qty 1

## 2015-03-18 MED ORDER — ACETAMINOPHEN 650 MG RE SUPP: 650.0000 mg | Freq: Four times a day (QID) | RECTAL | Status: DC | PRN

## 2015-03-18 MED ORDER — FAMOTIDINE 20 MG PO TABS
20.0000 mg | ORAL_TABLET | Freq: Every day | ORAL | Status: DC
  Administered 2015-03-18 – 2015-03-19 (×2): 20 mg via ORAL
  Filled 2015-03-18 (×2): qty 1

## 2015-03-18 MED ORDER — FERROUS SULFATE 325 (65 FE) MG PO TABS
325.0000 mg | ORAL_TABLET | Freq: Two times a day (BID) | ORAL | Status: DC
  Administered 2015-03-18 – 2015-03-19 (×2): 325 mg via ORAL
  Filled 2015-03-18 (×4): qty 1

## 2015-03-18 MED ORDER — GABAPENTIN 300 MG PO CAPS
300.0000 mg | ORAL_CAPSULE | Freq: Once | ORAL | Status: AC
  Administered 2015-03-18: 300 mg via ORAL

## 2015-03-18 MED ORDER — METHYLPREDNISOLONE SODIUM SUCC 125 MG IJ SOLR
125.0000 mg | Freq: Once | INTRAMUSCULAR | Status: AC
  Administered 2015-03-19: 125 mg via INTRAVENOUS
  Filled 2015-03-18: qty 2

## 2015-03-18 MED ORDER — METOPROLOL TARTRATE 50 MG PO TABS
50.0000 mg | ORAL_TABLET | Freq: Two times a day (BID) | ORAL | Status: DC
  Administered 2015-03-18 – 2015-03-19 (×3): 50 mg via ORAL
  Filled 2015-03-18 (×4): qty 1

## 2015-03-18 MED ORDER — VALACYCLOVIR HCL 500 MG PO TABS
500.0000 mg | ORAL_TABLET | Freq: Every day | ORAL | Status: DC
  Administered 2015-03-19: 500 mg via ORAL
  Filled 2015-03-18: qty 1

## 2015-03-18 MED ORDER — FUROSEMIDE 10 MG/ML IJ SOLN
40.0000 mg | Freq: Two times a day (BID) | INTRAMUSCULAR | Status: DC
  Administered 2015-03-18 – 2015-03-19 (×3): 40 mg via INTRAVENOUS
  Filled 2015-03-18 (×3): qty 4

## 2015-03-18 MED ORDER — METHYLPREDNISOLONE SODIUM SUCC 125 MG IJ SOLR
125.0000 mg | Freq: Once | INTRAMUSCULAR | Status: AC
  Administered 2015-03-18: 125 mg via INTRAVENOUS
  Filled 2015-03-18: qty 2

## 2015-03-18 MED ORDER — ASPIRIN 325 MG PO TABS
325.0000 mg | ORAL_TABLET | Freq: Every day | ORAL | Status: DC
  Administered 2015-03-18 – 2015-03-19 (×2): 325 mg via ORAL
  Filled 2015-03-18 (×2): qty 1

## 2015-03-18 MED ORDER — INSULIN GLARGINE 100 UNIT/ML ~~LOC~~ SOLN
30.0000 [IU] | Freq: Every day | SUBCUTANEOUS | Status: DC
  Administered 2015-03-18: 30 [IU] via SUBCUTANEOUS
  Filled 2015-03-18 (×3): qty 0.3

## 2015-03-18 MED ORDER — GABAPENTIN 300 MG PO CAPS
300.0000 mg | ORAL_CAPSULE | Freq: Every day | ORAL | Status: DC
  Administered 2015-03-18: 300 mg via ORAL
  Filled 2015-03-18 (×3): qty 1

## 2015-03-18 MED ORDER — IPRATROPIUM-ALBUTEROL 0.5-2.5 (3) MG/3ML IN SOLN
3.0000 mL | RESPIRATORY_TRACT | Status: DC | PRN
  Administered 2015-03-18: 3 mL via RESPIRATORY_TRACT
  Filled 2015-03-18: qty 3

## 2015-03-18 MED ORDER — AMLODIPINE BESYLATE 10 MG PO TABS
10.0000 mg | ORAL_TABLET | Freq: Every day | ORAL | Status: DC
  Administered 2015-03-18 – 2015-03-19 (×2): 10 mg via ORAL
  Filled 2015-03-18 (×2): qty 1

## 2015-03-18 MED ORDER — HYDRALAZINE HCL 10 MG PO TABS
10.0000 mg | ORAL_TABLET | Freq: Two times a day (BID) | ORAL | Status: DC
  Administered 2015-03-18 – 2015-03-19 (×3): 10 mg via ORAL
  Filled 2015-03-18 (×4): qty 1

## 2015-03-18 MED ORDER — ENOXAPARIN SODIUM 40 MG/0.4ML ~~LOC~~ SOLN
40.0000 mg | Freq: Every day | SUBCUTANEOUS | Status: DC
  Administered 2015-03-18: 40 mg via SUBCUTANEOUS
  Filled 2015-03-18 (×2): qty 0.4

## 2015-03-18 MED ORDER — ATORVASTATIN CALCIUM 20 MG PO TABS
20.0000 mg | ORAL_TABLET | Freq: Every day | ORAL | Status: DC
  Administered 2015-03-18 – 2015-03-19 (×2): 20 mg via ORAL
  Filled 2015-03-18 (×2): qty 1

## 2015-03-18 MED ORDER — GABAPENTIN 300 MG PO CAPS: 600.0000 mg | ORAL_CAPSULE | Freq: Every day | ORAL | Status: DC

## 2015-03-18 MED ORDER — INSULIN ASPART 100 UNIT/ML ~~LOC~~ SOLN
0.0000 [IU] | SUBCUTANEOUS | Status: DC
  Administered 2015-03-18: 5 [IU] via SUBCUTANEOUS
  Administered 2015-03-18: 11 [IU] via SUBCUTANEOUS
  Administered 2015-03-18: 15 [IU] via SUBCUTANEOUS
  Administered 2015-03-19: 11 [IU] via SUBCUTANEOUS
  Administered 2015-03-19: 3 [IU] via SUBCUTANEOUS
  Administered 2015-03-19 (×2): 5 [IU] via SUBCUTANEOUS

## 2015-03-18 MED ORDER — VALACYCLOVIR HCL 500 MG PO TABS
500.0000 mg | ORAL_TABLET | ORAL | Status: DC
  Administered 2015-03-18: 500 mg via ORAL
  Filled 2015-03-18: qty 1

## 2015-03-18 NOTE — Progress Notes (Signed)
Spoke with ED MD, no need to recollect ABG at this time. Patient is being admitted.

## 2015-03-18 NOTE — ED Provider Notes (Signed)
CSN: [id number]     Arrival date & time 03/18/15  0429 History   First MD Initiated Contact with Patient 03/18/15 [phone]     Chief Complaint  Patient presents with  . Shortness of Breath     (Consider location/radiation/quality/duration/timing/severity/associated sxs/prior Treatment) HPI Patient presents with 2 weeks of shortness of breath. States she's had a productive cough of yellow sputum. Shortness of breath is worse when lying flat and with any exertion. She also complains of central chest pain is worse with deep inspiration, coughing and movement. She's had bilateral lower extremity swelling but no calf tenderness. Admits to subjective fevers and chills. Past Medical History  Diagnosis Date  . CHF (congestive heart failure) 2011  . Bronchitis   . Diabetes mellitus Dx 1989  . Hypertension Dx 1989  . Ulcer 2010  . Hepatitis C Dx 2013  . Pancreatitis 2013  . Complication of anesthesia   . Refusal of blood transfusions as patient is Jehovah's Witness   . Asthma     As a child    Past Surgical History  Procedure Laterality Date  . Tubal ligation    . Cholecystectomy    . Knee arthroscopy    . Tubal ligation    . Eye surgery     Family History  Problem Relation Age of Onset  . Colon cancer Mother   . Heart attack Other   . Heart attack Maternal Grandmother   . Hypertension Sister   . Hypertension Brother   . Diabetes Paternal Grandmother    Social History  Substance Use Topics  . Smoking status: Former Smoker    Quit date: 10/25/1980  . Smokeless tobacco: Never Used     Comment: quit smoking in 1982  . Alcohol Use: Yes     Comment: ocassionally   OB History    No data available     Review of Systems  Constitutional: Positive for fever, chills and fatigue.  Respiratory: Positive for cough and shortness of breath.   Cardiovascular: Positive for chest pain and leg swelling. Negative for palpitations.  Gastrointestinal: Negative for nausea, vomiting, abdominal  pain and diarrhea.  Genitourinary: Negative for dysuria, frequency, hematuria and flank pain.  Musculoskeletal: Negative for myalgias, back pain, neck pain and neck stiffness.  Skin: Negative for rash and wound.  Neurological: Negative for dizziness, syncope, weakness, light-headedness, numbness and headaches.  All other systems reviewed and are negative.     Allergies  Shellfish allergy and Morphine and related  Home Medications   Prior to Admission medications   Medication Sig Start Date End Date Taking? Authorizing Provider  albuterol (PROAIR HFA) 108 (90 BASE) MCG/ACT inhaler Inhale 2 puffs into the lungs every 4 (four) hours as needed for wheezing. 12/05/14  Yes Tresa Garter, MD  amLODipine (NORVASC) 10 MG tablet Take 1 tablet (10 mg total) by mouth daily. 01/31/15  Yes Boykin Nearing, MD  aspirin 325 MG tablet Take 1 tablet (325 mg total) by mouth daily. 01/31/15  Yes Josalyn Funches, MD  atorvastatin (LIPITOR) 20 MG tablet Take 1 tablet (20 mg total) by mouth daily. 01/24/15  Yes Josalyn Funches, MD  Blood Glucose Monitoring Suppl (TRUE METRIX METER) W/DEVICE KIT 1 each by Does not apply route as needed. Patient taking differently: 1 each by Other route See admin instructions. Check blood sugar 3 times daily 11/08/14  Yes Josalyn Funches, MD  carvedilol (COREG) 25 MG tablet Take 1 tablet (25 mg total) by mouth 2 (two) times daily with  a meal. 03/14/15  Yes Josalyn Funches, MD  furosemide (LASIX) 40 MG tablet Take 1 tablet (40 mg total) by mouth 2 (two) times daily. 8 AM and 2 PM 03/14/15  Yes Josalyn Funches, MD  gabapentin (NEURONTIN) 300 MG capsule Take 1-2 capsules (300-600 mg total) by mouth at bedtime. 01/31/15  Yes Josalyn Funches, MD  glucose blood (TRUE METRIX BLOOD GLUCOSE TEST) test strip 1 each by Other route 3 (three) times daily. 11/08/14  Yes Josalyn Funches, MD  hydrALAZINE (APRESOLINE) 10 MG tablet Take 1 tablet (10 mg total) by mouth 2 (two) times daily. NEED OV.  02/11/15  Yes Dwana Melena, PA-C  insulin aspart (NOVOLOG) 100 UNIT/ML injection Inject 12 Units into the skin 3 (three) times daily before meals. 01/31/15  Yes Josalyn Funches, MD  Insulin Glargine (LANTUS SOLOSTAR) 100 UNIT/ML Solostar Pen Inject 42 Units into the skin daily at 10 pm. 03/14/15  Yes Josalyn Funches, MD  ketotifen (ZADITOR) 0.025 % ophthalmic solution Place 1 drop into both eyes 2 (two) times daily. 03/14/15  Yes Josalyn Funches, MD  lisinopril (PRINIVIL,ZESTRIL) 40 MG tablet Take 1 tablet (40 mg total) by mouth daily. 10/26/14  Yes Jaclyn Shaggy, MD  potassium chloride 20 MEQ TBCR Take 20 mEq by mouth 2 (two) times daily. With lasix Patient taking differently: Take 20 mEq by mouth daily. With lasix 03/14/15  Yes Josalyn Funches, MD  ranitidine (ZANTAC) 150 MG tablet Take 1 tablet (150 mg total) by mouth 2 (two) times daily. 12/24/14  Yes Josalyn Funches, MD  valACYclovir (VALTREX) 500 MG tablet Take 1 tablet (500 mg total) by mouth daily. 01/24/15  Yes Josalyn Funches, MD   BP 156/65 mmHg  Pulse 78  Temp(Src) 98.2 F (36.8 C) (Oral)  Resp 16  Ht 5\' 8"  (1.727 m)  Wt 286 lb 13.1 oz (130.1 kg)  BMI 43.62 kg/m2  SpO2 97% Physical Exam  Constitutional: She is oriented to person, place, and time. She appears well-developed and well-nourished. No distress.  HENT:  Head: Normocephalic and atraumatic.  Mouth/Throat: Oropharynx is clear and moist.  Eyes: EOM are normal. Pupils are equal, round, and reactive to light.  Neck: Normal range of motion. Neck supple.  Cardiovascular: Normal rate and regular rhythm.   Pulmonary/Chest: Effort normal and breath sounds normal. No respiratory distress. She has no wheezes. She has no rales. She exhibits no tenderness.  Diminished breath sounds bilateral bases.  Abdominal: Soft. Bowel sounds are normal. She exhibits no distension and no mass. There is no tenderness. There is no rebound and no guarding.  Musculoskeletal: Normal range of motion. She  exhibits edema. She exhibits no tenderness.  1+ pitting edema bilateral lower extremities.  Neurological: She is alert and oriented to person, place, and time.  Skin: Skin is warm and dry. No rash noted. No erythema.  Psychiatric: She has a normal mood and affect. Her behavior is normal.  Nursing note and vitals reviewed.   ED Course  Procedures (including critical care time) Labs Review Labs Reviewed  CBC WITH DIFFERENTIAL/PLATELET - Abnormal; Notable for the following:    RBC 3.62 (*)    Hemoglobin 9.6 (*)    HCT 30.4 (*)    Neutro Abs 7.9 (*)    All other components within normal limits  COMPREHENSIVE METABOLIC PANEL - Abnormal; Notable for the following:    Glucose, Bld 264 (*)    BUN 39 (*)    Creatinine, Ser 1.54 (*)    Calcium 8.6 (*)  Albumin 3.3 (*)    Alkaline Phosphatase 152 (*)    GFR calc non Af Amer 37 (*)    GFR calc Af Amer 42 (*)    All other components within normal limits  CBC - Abnormal; Notable for the following:    RBC 3.75 (*)    Hemoglobin 10.0 (*)    HCT 31.5 (*)    All other components within normal limits  CREATININE, SERUM - Abnormal; Notable for the following:    Creatinine, Ser 1.48 (*)    GFR calc non Af Amer 38 (*)    GFR calc Af Amer 45 (*)    All other components within normal limits  GLUCOSE, CAPILLARY - Abnormal; Notable for the following:    Glucose-Capillary 217 (*)    All other components within normal limits  GLUCOSE, CAPILLARY - Abnormal; Notable for the following:    Glucose-Capillary 346 (*)    All other components within normal limits  GLUCOSE, CAPILLARY - Abnormal; Notable for the following:    Glucose-Capillary 423 (*)    All other components within normal limits  GLUCOSE, CAPILLARY - Abnormal; Notable for the following:    Glucose-Capillary 384 (*)    All other components within normal limits  GLUCOSE, CAPILLARY - Abnormal; Notable for the following:    Glucose-Capillary 324 (*)    All other components within normal  limits  I-STAT ARTERIAL BLOOD GAS, ED - Abnormal; Notable for the following:    pH, Arterial 7.331 (*)    pO2, Arterial 39.0 (*)    Acid-base deficit 3.0 (*)    All other components within normal limits  POCT I-STAT 3, ART BLOOD GAS (G3+) - Abnormal; Notable for the following:    pO2, Arterial 71.0 (*)    Acid-base deficit 4.0 (*)    All other components within normal limits  MRSA PCR SCREENING  BRAIN NATRIURETIC PEPTIDE  TROPONIN I  TROPONIN I  COMPREHENSIVE METABOLIC PANEL  CBC  IRON AND TIBC  FERRITIN  I-STAT TROPOININ, ED    Imaging Review Dg Chest Portable 1 View  03/18/2015   CLINICAL DATA:  Chest pain with shortness of breath for 2 weeks.  EXAM: PORTABLE CHEST - 1 VIEW  COMPARISON:  02/15/2015  FINDINGS: Normal heart size and pulmonary vascularity. Shallow inspiration. Probable atelectasis in the lung bases. Soft tissue attenuation also over the lower lungs. No focal consolidation. No blunting of costophrenic angles. No pneumothorax. Mediastinal contours appear intact.  IMPRESSION: Shallow inspiration with probable atelectasis in the lung bases.   Electronically Signed   By: Lucienne Capers M.D.   On: 03/18/2015 05:56   I have personally reviewed and evaluated these images and lab results as part of my medical decision-making.   EKG Interpretation   Date/Time:  Monday March 18 2015 05:09:08 EDT Ventricular Rate:  69 PR Interval:  139 QRS Duration: 91 QT Interval:  439 QTC Calculation: 470 R Axis:   62 Text Interpretation:  Sinus rhythm Consider left atrial enlargement  Confirmed by Danay Mckellar  MD, Fernandez Kenley ([id number]) on 03/18/2015 5:27:18 AM      MDM   Final diagnoses:  Shortness of breath  Chest pain, unspecified chest pain type   On reevaluation patient appears comfortable however has diffuse expiratory wheezes. He states that her shortness of breath and chest pain or exacerbated by any activity. Given Solu-Medrol and continuous nebulized treatment in the emergency  department.   Discussed with internal medicine service and will see patient in emergency department and  admit.  Julianne Rice, MD 03/19/15 [phone]

## 2015-03-18 NOTE — Progress Notes (Signed)
Attempted to collect ABG twice, with one venous collection. Given report to dayshift RT and she will collect it.

## 2015-03-18 NOTE — Progress Notes (Signed)
Removed Bipap from patient after she said she was uncomfortable and wanted a break. Her work of breathing is decreased and on 3L nasal cannula, her SpO2 is 96-97% RT will continue to monitor.

## 2015-03-18 NOTE — H&P (Signed)
Date: 03/18/2015               Patient Name:  [name] MRN: [id number]  DOB: 21-Feb-1956 Age / Sex: 59 y.o., female   PCP: Boykin Nearing, MD         Medical Service: Internal Medicine Teaching Service         Attending Physician: Dr. Sid Falcon, MD    First Contact: Dr. Ignacia Marvel Pager: [phone]  Second Contact: Dr. Dellia Nims Pager: [phone]       After Hours (After 5p/  First Contact Pager: [phone]  weekends / holidays): Second Contact Pager: [phone]   Chief Complaint: Shortness of Breath  History of Present Illness: 59 year old woman with past medical history of T2DM, hypertension, Hep C, pancreatitis, asthma, diastolic CHF presents to the emergency department for dyspnea on exertion for the past 2 weeks worsening with shortness of breath and chest pain since yesterday. This is accompanied with a persistent cough productive of clear to yellow sputum, wheezing, increased leg swelling, worsened 1-2 pillow orthopnea, and a self reported 7 pound weight gain over the interval. She now has shortness of breath at rest and chest pain/tightness that is worsened on exertion and deep inspiration. Symptoms not relieved with albuterol inhaler at home. She is currently taking 40 mg PO Lasix twice a day. Also endorses history consistent with overflow and stress incontinence worsened by frequent coughing. Of note she was recently hospitalized 1 month ago and treated for community-acquired pneumonia with azithromycin. She was seen by primary care provider 4 days ago with milder degree of same complaints.  States this episode is similar to shortness of breath she experienced once last year. Has history of asthma with albuterol inhaler, no PFTs on record and patient does not recall when/if study was done. She does have history of OSA by sleep study but states she does not use CPAP due to cost. Remote 10 pack-year smoking history stopped 1980s with no other drug use reported.  ABG obtained in ED  with a pO2 of 42. IV steroids, albuterol nebulizer, BIPAP at ED with improvement in dyspnea by time of evaluation by admitting service.  Meds: Current Facility-Administered Medications  Medication Dose Route Frequency Provider Last Rate Last Dose  . albuterol (PROVENTIL,VENTOLIN) solution continuous neb  10 mg/hr Nebulization Continuous Julianne Unknown Flannigan, MD 2 mL/hr at 03/18/15 0658 10 mg/hr at 03/18/15 0658  . furosemide (LASIX) injection 40 mg  40 mg Intravenous BID Dellia Nims, MD   40 mg at 03/18/15 0846  . insulin aspart (novoLOG) injection 0-15 Units  0-15 Units Subcutaneous 6 times per day Dellia Nims, MD        Allergies: Allergies as of 03/18/2015 - Review Complete 03/18/2015  Allergen Reaction Noted  . Shellfish allergy Anaphylaxis and Swelling 05/14/2012  . Morphine and related Itching 05/14/2012   Past Medical History  Diagnosis Date  . CHF (congestive heart failure) 2011  . Bronchitis   . Diabetes mellitus Dx 1989  . Hypertension Dx 1989  . Ulcer 2010  . Hepatitis C Dx 2013  . Pancreatitis 2013  . Complication of anesthesia   . Refusal of blood transfusions as patient is Jehovah's Witness   . Asthma     As a child    Past Surgical History  Procedure Laterality Date  . Tubal ligation    . Cholecystectomy    . Knee arthroscopy    . Tubal ligation    . Eye surgery  Family History  Problem Relation Age of Onset  . Colon cancer Mother   . Heart attack Other   . Heart attack Maternal Grandmother   . Hypertension Sister   . Hypertension Brother   . Diabetes Paternal Grandmother    Social History   Social History  . Marital Status: Divorced    Spouse Name: N/A  . Number of Children: N/A  . Years of Education: N/A   Occupational History  . Not on file.   Social History Main Topics  . Smoking status: Former Smoker    Quit date: 10/25/1980  . Smokeless tobacco: Never Used     Comment: quit smoking in 1982  . Alcohol Use: Yes     Comment:  ocassionally  . Drug Use: No     Comment: quit 18 yrs ago  . Sexual Activity: Not on file   Other Topics Concern  . Not on file   Social History Narrative    Review of Systems: Review of Systems  Constitutional: Negative for fever and chills.  Respiratory: Positive for cough, sputum production, shortness of breath and wheezing.   Cardiovascular: Positive for chest pain, orthopnea and leg swelling.  Gastrointestinal: Positive for constipation. Negative for nausea, vomiting, abdominal pain and diarrhea.  Genitourinary: Positive for urgency and frequency. Negative for dysuria.  Neurological: Negative for dizziness and focal weakness.    Physical Exam: Blood pressure 166/72, pulse 71, temperature 97.9 F (36.6 C), temperature source Oral, resp. rate 16, height 5\' 8"  (1.727 m), weight 127.007 kg (280 lb), SpO2 93 %.   GENERAL- alert, co-operative, NAD, on continuous nebulizer treatment during history and exam, carries conversation pausing for breath during long responses HEENT- Atraumatic, oral mucosa appears moist, good and intact dentition CARDIAC- RRR, no murmurs, rubs or gallops. RESP- Diffuse wheezes all lung fields, good air movement ABDOMEN- Soft, nontender, no guarding or rebound EXTREMITIES- pulse 2+, symmetric, 1+ pedal edema below knees SKIN- Warm, dry, No rash or lesion. PSYCH- Normal mood and affect, appropriate thought content and speech.  Lab results: Basic Metabolic Panel:  Recent Labs  03/18/15 0514  NA 140  K 4.3  CL 109  CO2 23  GLUCOSE 264*  BUN 39*  CREATININE 1.54*  CALCIUM 8.6*   Liver Function Tests:  Recent Labs  03/18/15 0514  AST 21  ALT 22  ALKPHOS 152*  BILITOT 0.5  PROT 7.5  ALBUMIN 3.3*   No results for input(s): LIPASE, AMYLASE in the last 72 hours. No results for input(s): AMMONIA in the last 72 hours. CBC:  Recent Labs  03/18/15 0514  WBC 10.3  NEUTROABS 7.9*  HGB 9.6*  HCT 30.4*  MCV 84.0  PLT 240   Cardiac  Enzymes: No results for input(s): CKTOTAL, CKMB, CKMBINDEX, TROPONINI in the last 72 hours. BNP: No results for input(s): PROBNP in the last 72 hours. D-Dimer: No results for input(s): DDIMER in the last 72 hours. CBG: No results for input(s): GLUCAP in the last 72 hours. Hemoglobin A1C: No results for input(s): HGBA1C in the last 72 hours. Fasting Lipid Panel: No results for input(s): CHOL, HDL, LDLCALC, TRIG, CHOLHDL, LDLDIRECT in the last 72 hours. Thyroid Function Tests: No results for input(s): TSH, T4TOTAL, FREET4, T3FREE, THYROIDAB in the last 72 hours. Anemia Panel: No results for input(s): VITAMINB12, FOLATE, FERRITIN, TIBC, IRON, RETICCTPCT in the last 72 hours. Coagulation: No results for input(s): LABPROT, INR in the last 72 hours. Urine Drug Screen: Drugs of Abuse     Component  Value Date/Time   LABOPIA NONE DETECTED 03/20/2009 1702   COCAINSCRNUR NONE DETECTED 03/20/2009 1702   LABBENZ NONE DETECTED 03/20/2009 1702   AMPHETMU NONE DETECTED 03/20/2009 1702   THCU NONE DETECTED 03/20/2009 1702   LABBARB  03/20/2009 1702    NONE DETECTED        DRUG SCREEN FOR MEDICAL PURPOSES ONLY.  IF CONFIRMATION IS NEEDED FOR ANY PURPOSE, NOTIFY LAB WITHIN 5 DAYS.        LOWEST DETECTABLE LIMITS FOR URINE DRUG SCREEN Drug Class       Cutoff (ng/mL) Amphetamine      1000 Barbiturate      200 Benzodiazepine   [id number] Tricyclics       [id number] Opiates          300 Cocaine          300 THC              50    Alcohol Level: No results for input(s): ETH in the last 72 hours. Urinalysis: No results for input(s): COLORURINE, LABSPEC, PHURINE, GLUCOSEU, HGBUR, BILIRUBINUR, KETONESUR, PROTEINUR, UROBILINOGEN, NITRITE, LEUKOCYTESUR in the last 72 hours.  Invalid input(s): APPERANCEUR  Imaging results:  Dg Chest Portable 1 View  03/18/2015   CLINICAL DATA:  Chest pain with shortness of breath for 2 weeks.  EXAM: PORTABLE CHEST - 1 VIEW  COMPARISON:  02/15/2015  FINDINGS: Normal heart  size and pulmonary vascularity. Shallow inspiration. Probable atelectasis in the lung bases. Soft tissue attenuation also over the lower lungs. No focal consolidation. No blunting of costophrenic angles. No pneumothorax. Mediastinal contours appear intact.  IMPRESSION: Shallow inspiration with probable atelectasis in the lung bases.   Electronically Signed   By: Lucienne Capers M.D.   On: 03/18/2015 05:56    Other results: EKG: normal EKG, normal sinus rhythm, unchanged from previous tracings.  Assessment & Plan by Problem: Active Problems: Asthma exacerbation: Likely triggered by viral URI, allergens, or less likely still related to PNA from last month which was treated. CXR showing atelectasis without any obvious congestion or consolidation. BNP 70s, WBC 10.3, Hgb 9.6. No fevers or chills in history. Unlikely to be a PNA or heart failure exacerbation as primary underlying cause. Symptoms improving with IV steroids and nebulizer treatment, but was markedly hypoxic on presentation. - Continue BIPAP, recheck ABG and wean to [place] as tolerated - NPO while BIPAP active - 125mg  solumedrol IV qdaily - q4hrs scheduled duonebs - Repeat 2 view CXR, CBC, CMP in AM  CHF, grade 2 diastolic dysfunction: Not likely primary cause of SOB, but may be multifactorial. BNP on admission 70s, no rales on exam. Recent echo on 02/16/2015 showing LVEF 55-60% with grade 2 dysfunction. Chest pain nonreproducible on exam and increases with exertion, but worsens on deep inspiration. - Hold home coreg during asthma exacerbation, metoprolol 50mg  BID - Lasix 40mg  IV BID - F/U serial troponin  HTN - Continue home amlodipine 10mg  - Continue home Lisinopril 40mg   T2DM On 42 units lantus 12 units novolog TID as outpt, Hgb A1c poorly controlled but improving on outpatient trend last 8.2. Will lower from home dose due to NPO, to be adjusted particularly on steroid Rx. - 30 units Lantus plus SSI-M  HSV - Continue home valtrex  500mg  every other day - Continue home gabapentin 300mg  qHS  Dispo: Disposition is deferred at this time, awaiting improvement of current medical problems. Anticipated discharge in approximately 1-2 day(s).   The patient does have a current PCP (Josalyn  Funches, MD) and does not need an [facility] hospital follow-up appointment after discharge.  The patient does not have transportation limitations that hinder transportation to clinic appointments.  Signed: Collier Salina, MD 03/18/2015, 9:20 AM

## 2015-03-18 NOTE — ED Notes (Signed)
MD at bedside. 

## 2015-03-18 NOTE — ED Notes (Signed)
Per EMS: pt comes from home c/o worsened SOB tonight.  Pt was admitted and treated for pneumonia 2 weeks ago and has had the SOB since.  Pt also c/o CP with exertion and deep inspiration.  Pt took inhaler and 325 ASA before arrival to ED.  EMS noted pt's SpO2 75% on RA at home - put on CPAP and now 98%.  Pt A&O x 4.  Respirations labored/equal.

## 2015-03-19 ENCOUNTER — Telehealth: Payer: Self-pay

## 2015-03-19 ENCOUNTER — Inpatient Hospital Stay (HOSPITAL_COMMUNITY): Payer: Medicaid Other

## 2015-03-19 LAB — CBC
HCT: 30 % — ABNORMAL LOW (ref 36.0–46.0)
Hemoglobin: 9.5 g/dL — ABNORMAL LOW (ref 12.0–15.0)
MCH: 26.2 pg (ref 26.0–34.0)
MCHC: 31.7 g/dL (ref 30.0–36.0)
MCV: 82.9 fL (ref 78.0–100.0)
Platelets: 228 10*3/uL (ref 150–400)
RBC: 3.62 MIL/uL — ABNORMAL LOW (ref 3.87–5.11)
RDW: 14.9 % (ref 11.5–15.5)
WBC: 11.7 10*3/uL — ABNORMAL HIGH (ref 4.0–10.5)

## 2015-03-19 LAB — COMPREHENSIVE METABOLIC PANEL
ALT: 19 U/L (ref 14–54)
AST: 16 U/L (ref 15–41)
Albumin: 3.3 g/dL — ABNORMAL LOW (ref 3.5–5.0)
Alkaline Phosphatase: 140 U/L — ABNORMAL HIGH (ref 38–126)
Anion gap: 9 (ref 5–15)
BUN: 46 mg/dL — ABNORMAL HIGH (ref 6–20)
CO2: 22 mmol/L (ref 22–32)
Calcium: 8.7 mg/dL — ABNORMAL LOW (ref 8.9–10.3)
Chloride: 106 mmol/L (ref 101–111)
Creatinine, Ser: 1.62 mg/dL — ABNORMAL HIGH (ref 0.44–1.00)
GFR calc Af Amer: 40 mL/min — ABNORMAL LOW (ref >60)
GFR calc non Af Amer: 34 mL/min — ABNORMAL LOW (ref >60)
Glucose, Bld: 196 mg/dL — ABNORMAL HIGH (ref 65–99)
Potassium: 4.5 mmol/L (ref 3.5–5.1)
Sodium: 137 mmol/L (ref 135–145)
Total Bilirubin: 0.3 mg/dL (ref 0.3–1.2)
Total Protein: 7.1 g/dL (ref 6.5–8.1)

## 2015-03-19 LAB — IRON AND TIBC
Iron: 56 ug/dL (ref 28–170)
Saturation Ratios: 14 % (ref 10.4–31.8)
TIBC: 398 ug/dL (ref 250–450)
UIBC: 342 ug/dL

## 2015-03-19 LAB — GLUCOSE, CAPILLARY
Glucose-Capillary: 233 mg/dL — ABNORMAL HIGH (ref 65–99)
Glucose-Capillary: 170 mg/dL — ABNORMAL HIGH (ref 65–99)
Glucose-Capillary: 234 mg/dL — ABNORMAL HIGH (ref 65–99)

## 2015-03-19 LAB — FERRITIN: Ferritin: 75 ng/mL (ref 11–307)

## 2015-03-19 MED ORDER — PREDNISONE 20 MG PO TABS: 20.0000 mg | ORAL_TABLET | ORAL | Status: DC

## 2015-03-19 NOTE — Care Management Note (Addendum)
Case Management Note  Patient Details  Name: MARGRIT WIGLEY MRN: [id number] Date of Birth: [birth year]  Subjective/Objective:     Patient lives alone, pta idep, alert and oriented.  Patient states she will be going home today and her sister will transport her.  Patient sees Dr. Loma Messing at [facility] cllinic she will have a follow up apt there also to see Dr. Jarold Song.  Patient has decided to go to [facility] Care at [facility] clinic as well.   Patient states it has been a while since she applied for medicaid.  She will go to [facility] clinic to get her medications at discharge.                Action/Plan:   Expected Discharge Date:  03/21/15               Expected Discharge Plan:  Home/Self Care  In-House Referral:     Discharge planning Services  CM Consult  Post Acute Care Choice:    Choice offered to:     DME Arranged:    DME Agency:     HH Arranged:    HH Agency:     Status of Service:  Completed, signed off  Medicare Important Message Given:    Date Medicare IM Given:    Medicare IM give by:    Date Additional Medicare IM Given:    Additional Medicare Important Message give by:     If discussed at [place] of Stay Meetings, dates discussed:    Additional Comments:  Zenon Mayo, RN 03/19/2015, 12:25 PM

## 2015-03-19 NOTE — Discharge Instructions (Signed)
You were admitted to the hospital for a worsening of your asthma symptoms. This could be caused by several sources such as a viral respiratory infection or environmental allergens. The main treatment for this condition is steroids and inhaler treatments. Follow up with your PCP at [place] as soon as able/within 2 weeks for re check and refilling medications if needed.  Seek help sooner if your breathing becomes so much worse you are short of breathing at rest and does not improve with treatments.  Taking steroids can cause side effects including appetite increase, weight gain, high blood sugars, swelling.   After discharge it is important to take your medications as prescribed: Prednisone 60mg  daily x2 days Prednisone 40mg  daily x2 days Prednisone 20mg  daily x2 days Prednisone 10mg  daily x2 days Albuterol inhaler as needed as previously prescribed.

## 2015-03-19 NOTE — Discharge Summary (Signed)
Name: [name] MRN: [id number] DOB: September 05, 1955 59 y.o. PCP: Boykin Nearing, MD  Date of Admission: 03/18/2015  4:29 AM Date of Discharge: 03/19/2015 Attending Physician: Aldine Contes  Discharge Diagnosis: Principal Problem:   Asthma exacerbation Active Problems:   Diabetes mellitus type 2, uncontrolled, with complications   Acute respiratory failure with hypoxia   Essential hypertension   OSA (obstructive sleep apnea)   Hep C w/o coma, chronic   Diabetic neuropathy   Diastolic CHF, chronic   Pain in the chest  Discharge Medications:   Medication List    TAKE these medications        albuterol 108 (90 BASE) MCG/ACT inhaler  Commonly known as:  PROAIR HFA  Inhale 2 puffs into the lungs every 4 (four) hours as needed for wheezing.     amLODipine 10 MG tablet  Commonly known as:  NORVASC  Take 1 tablet (10 mg total) by mouth daily.     aspirin 325 MG tablet  Take 1 tablet (325 mg total) by mouth daily.     atorvastatin 20 MG tablet  Commonly known as:  LIPITOR  Take 1 tablet (20 mg total) by mouth daily.     carvedilol 25 MG tablet  Commonly known as:  COREG  Take 1 tablet (25 mg total) by mouth 2 (two) times daily with a meal.     furosemide 40 MG tablet  Commonly known as:  LASIX  Take 1 tablet (40 mg total) by mouth 2 (two) times daily. 8 AM and 2 PM     gabapentin 300 MG capsule  Commonly known as:  NEURONTIN  Take 1-2 capsules (300-600 mg total) by mouth at bedtime.     glucose blood test strip  Commonly known as:  TRUE METRIX BLOOD GLUCOSE TEST  1 each by Other route 3 (three) times daily.     hydrALAZINE 10 MG tablet  Commonly known as:  APRESOLINE  Take 1 tablet (10 mg total) by mouth 2 (two) times daily. NEED OV.     insulin aspart 100 UNIT/ML injection  Commonly known as:  novoLOG  Inject 12 Units into the skin 3 (three) times daily before meals.     Insulin Glargine 100 UNIT/ML Solostar Pen  Commonly known as:  LANTUS SOLOSTAR    Inject 42 Units into the skin daily at 10 pm.     ketotifen 0.025 % ophthalmic solution  Commonly known as:  ZADITOR  Place 1 drop into both eyes 2 (two) times daily.     lisinopril 40 MG tablet  Commonly known as:  PRINIVIL,ZESTRIL  Take 1 tablet (40 mg total) by mouth daily.     Potassium Chloride ER 20 MEQ Tbcr  Take 20 mEq by mouth 2 (two) times daily. With lasix     predniSONE 20 MG tablet  Commonly known as:  DELTASONE  Take 1 tablet (20 mg total) by mouth as directed. Take as directed for a total of 8 days for your asthma treatment: 3 pills (66m) for 2 days 2 pills (420m for 2 days 1 pills (2042mfor 2 days 1/2 pill (17m39mor 2 days     ranitidine 150 MG tablet  Commonly known as:  ZANTAC  Take 1 tablet (150 mg total) by mouth 2 (two) times daily.     TRUE METRIX METER W/DEVICE Kit  1 each by Does not apply route as needed.     valACYclovir 500 MG tablet  Commonly known as:  VALTREX  Take 1 tablet (500 mg total) by mouth daily.       Disposition and follow-up:   Ms.[name] was discharged from [facility] in Good condition.  At the hospital follow up visit please address:  1.  Athsma exacerbation: Patient admitted for shortness of breath after 2 weeks of cough. Discharged on 8 day prednisone taper 60/40/20/10mg. Check for resolution of symptoms. Patient had no PFTs on record and did not recall a previous study. Should be referred for this in the future to assess if she needs some treatment besides PRN albuterol, since she has been seen at hospital for similar symptoms multiple times now and recent PNA.  2.  Labs / imaging needed at time of follow-up: none  3.  Pending labs/ test needing follow-up: none  Follow-up Appointments:     Follow-up Information    Follow up with Minerva Ends, MD. Go on 03/25/2015.   Specialty:  Family Medicine   Why:  at 12:00pm for scheduled visit   Contact information:   [name]  [phone]       Discharge Instructions: Discharge Instructions    Diet - low sodium heart healthy    Complete by:  As directed            Consultations:    Procedures Performed:  X-[facility] Chest Pa And Lateral  03/19/2015   CLINICAL DATA:  History of asthma, acute respiratory failure, diabetes, CHF.  EXAM: CHEST  2 VIEW  COMPARISON:  Portable chest x-[place] of March 18, 2015  FINDINGS: The lungs are well-expanded. The pulmonary interstitial markings are more conspicuous today. The cardiac silhouette remains enlarged. The central pulmonary vascularity is mildly engorged. The mediastinum is normal in width. There is no significant pleural effusion. The bony thorax is unremarkable.  IMPRESSION: Mild pulmonary interstitial edema secondary to CHF. There is no alveolar pneumonia.   Electronically Signed   By: David  Martinique M.D.   On: 03/19/2015 07:40   Dg Chest Portable 1 View  03/18/2015   CLINICAL DATA:  Chest pain with shortness of breath for 2 weeks.  EXAM: PORTABLE CHEST - 1 VIEW  COMPARISON:  02/15/2015  FINDINGS: Normal heart size and pulmonary vascularity. Shallow inspiration. Probable atelectasis in the lung bases. Soft tissue attenuation also over the lower lungs. No focal consolidation. No blunting of costophrenic angles. No pneumothorax. Mediastinal contours appear intact.  IMPRESSION: Shallow inspiration with probable atelectasis in the lung bases.   Electronically Signed   By: Lucienne Capers M.D.   On: 03/18/2015 05:56     Admission HPI: 59 year old woman with past medical history of T2DM, hypertension, Hep C, pancreatitis, asthma, diastolic CHF presents to the emergency department for dyspnea on exertion for the past 2 weeks worsening with shortness of breath and chest pain since yesterday. This is accompanied with a persistent cough productive of clear to yellow sputum, wheezing, increased leg swelling, worsened 1-2 pillow orthopnea, and a self reported 7 pound weight gain  over the interval. She now has shortness of breath at rest and chest pain/tightness that is worsened on exertion and deep inspiration. Symptoms not relieved with albuterol inhaler at home. She is currently taking 40 mg PO Lasix twice a day. Also endorses history consistent with overflow and stress incontinence worsened by frequent coughing. Of note she was recently hospitalized 1 month ago and treated for community-acquired pneumonia with azithromycin. She was seen by primary care provider 4 days ago with milder  degree of same complaints. States this episode is similar to shortness of breath she experienced once last year. Has history of asthma with albuterol inhaler, no PFTs on record and patient does not recall when/if study was done. She does have history of OSA by sleep study but states she does not use CPAP due to cost. Remote 10 pack-year smoking history stopped 1980s with no other drug use reported. ABG obtained in ED with a pO2 of 42. IV steroids, albuterol nebulizer, BIPAP at ED with improvement in dyspnea by time of evaluation by admitting service.  Hospital Course by problem list: 1. Asthma exacerbation: Admitted from ED due to moderate respiratory distress at rest and pO2 of 41 requiring BIPAP with intermittent nebulizer treatments. CXR was not concerning for pneumonia, patient did not have fever or leukocytosis. Physical exam showed prominent diffuse wheezes, without significant crackles JVD or pedal edema. Patient given 147m solumedrol and scheduled nebulizers. Respiratory status improved and was weaned to 3L O2 by nasal cannula by PM. Repeat ABG with pO2 improved and patient on room air overnight. Serial troponins were negative x3. Anemia panel unremarkable. Coreg was changed to metoprolol to avoid nonselective beta antagonism in setting of asthma exacerbation. Patient was at baseline on room air without wheezing SOB or chest pain on AM reevaluation.  2. Type 2  diabetes mellitus: Last Hgb A1c 8.2, on improving trend outpatient. Home dose lantus decreased to 30U initially due to NPO on BIPAP. CBGs 200s increased to 300s with IV steroids and resuming diet. Home insulin resumed and patient discharged on previous therapy.  3. Diastolic heart failure, chronic: Home lasix was increased to 452mIV BID during hospital course. Exam, CXR, history, BNP 70s all not suggestive of heart failure as primary etiology. Should resume established home dose at discharge.  Discharge Vitals:   BP 148/61 mmHg  Pulse 53  Temp(Src) 97.8 F (36.6 C) (Oral)  Resp 19  Ht 5' 8" (1.727 m)  Wt 87.1 kg (192 lb 0.3 oz)  BMI 29.20 kg/m2  SpO2 95%  Discharge Labs:  Results for orders placed or performed during the hospital encounter of 03/18/15 (from the past 24 hour(s))  MRSA PCR Screening     Status: None   Collection Time: 03/18/15  2:39 PM  Result Value Ref Range   MRSA by PCR NEGATIVE NEGATIVE  Glucose, capillary     Status: Abnormal   Collection Time: 03/18/15  4:39 PM  Result Value Ref Range   Glucose-Capillary 346 (H) 65 - 99 mg/dL  Troponin I (q 6hr x 3)     Status: None   Collection Time: 03/18/15  4:44 PM  Result Value Ref Range   Troponin I <0.03 <0.031 ng/mL  Glucose, capillary     Status: Abnormal   Collection Time: 03/18/15  7:58 PM  Result Value Ref Range   Glucose-Capillary 423 (H) 65 - 99 mg/dL  Glucose, capillary     Status: Abnormal   Collection Time: 03/18/15  9:49 PM  Result Value Ref Range   Glucose-Capillary 384 (H) 65 - 99 mg/dL  Glucose, capillary     Status: Abnormal   Collection Time: 03/18/15 11:47 PM  Result Value Ref Range   Glucose-Capillary 324 (H) 65 - 99 mg/dL  Glucose, capillary     Status: Abnormal   Collection Time: 03/19/15  3:56 AM  Result Value Ref Range   Glucose-Capillary 233 (H) 65 - 99 mg/dL  Comprehensive metabolic panel     Status: Abnormal  Collection Time: 03/19/15  6:20 AM  Result Value Ref Range   Sodium 137  135 - 145 mmol/L   Potassium 4.5 3.5 - 5.1 mmol/L   Chloride 106 101 - 111 mmol/L   CO2 22 22 - 32 mmol/L   Glucose, Bld 196 (H) 65 - 99 mg/dL   BUN 46 (H) 6 - 20 mg/dL   Creatinine, Ser 1.62 (H) 0.44 - 1.00 mg/dL   Calcium 8.7 (L) 8.9 - 10.3 mg/dL   Total Protein 7.1 6.5 - 8.1 g/dL   Albumin 3.3 (L) 3.5 - 5.0 g/dL   AST 16 15 - 41 U/L   ALT 19 14 - 54 U/L   Alkaline Phosphatase 140 (H) 38 - 126 U/L   Total Bilirubin 0.3 0.3 - 1.2 mg/dL   GFR calc non Af Amer 34 (L) >60 mL/min   GFR calc Af Amer 40 (L) >60 mL/min   Anion gap 9 5 - 15  CBC     Status: Abnormal   Collection Time: 03/19/15  6:20 AM  Result Value Ref Range   WBC 11.7 (H) 4.0 - 10.5 K/uL   RBC 3.62 (L) 3.87 - 5.11 MIL/uL   Hemoglobin 9.5 (L) 12.0 - 15.0 g/dL   HCT 30.0 (L) 36.0 - 46.0 %   MCV 82.9 78.0 - 100.0 fL   MCH 26.2 26.0 - 34.0 pg   MCHC 31.7 30.0 - 36.0 g/dL   RDW 14.9 11.5 - 15.5 %   Platelets 228 150 - 400 K/uL  Iron and TIBC     Status: None   Collection Time: 03/19/15  6:20 AM  Result Value Ref Range   Iron 56 28 - 170 ug/dL   TIBC 398 250 - 450 ug/dL   Saturation Ratios 14 10.4 - 31.8 %   UIBC 342 ug/dL  Ferritin     Status: None   Collection Time: 03/19/15  6:20 AM  Result Value Ref Range   Ferritin 75 11 - 307 ng/mL  Glucose, capillary     Status: Abnormal   Collection Time: 03/19/15  8:21 AM  Result Value Ref Range   Glucose-Capillary 170 (H) 65 - 99 mg/dL  Glucose, capillary     Status: Abnormal   Collection Time: 03/19/15 12:10 PM  Result Value Ref Range   Glucose-Capillary 234 (H) 65 - 99 mg/dL    Signed: Collier Salina, MD 03/19/2015, 1:46 PM

## 2015-03-19 NOTE — Progress Notes (Signed)
Patient was discharged home by MD order; discharged instructions review and give to patient with care notes and prescriptions; IV DIC; skin intact; patient will be escorted to the car by nurse tech via wheelchair.  

## 2015-03-19 NOTE — Progress Notes (Signed)
Subjective: Patient increasingly comfortable since yesterday. Now resting with no trouble on room air. Not coughing and has no chest pain, thinks her breathing is back to her normal.  Objective: Vital signs in last 24 hours: Filed Vitals:   03/18/15 1956 03/18/15 2206 03/19/15 0540 03/19/15 0546  BP: 156/65  148/61   Pulse: 74 78 53   Temp: 98.2 F (36.8 C)  97.8 F (36.6 C)   TempSrc: Oral  Oral   Resp: 20 16 19    Height:      Weight:    87.1 kg (192 lb 0.3 oz)  SpO2: 98% 97% 95%    Weight change: 3.093 kg (6 lb 13.1 oz)  Intake/Output Summary (Last 24 hours) at 03/19/15 1100 Last data filed at 03/19/15 1019  Gross per 24 hour  Intake    780 ml  Output    950 ml  Net   -170 ml    GENERAL- alert, co-operative, NAD, on room air without distress or increased effort HEENT- Atraumatic, oral mucosa appears moist, good and intact dentition, no cervical lymphadenopathy CARDIAC- RRR, no murmurs, rubs or gallops. RESP- CTAB, good air movement ABDOMEN- Soft, nontender, no guarding or rebound EXTREMITIES- pulse 2+, symmetric, 1+ pedal edema below knees SKIN- Warm, dry, No rash or lesion. PSYCH- Normal mood and affect, appropriate thought content and speech.  Lab Results: Basic Metabolic Panel:  Recent Labs Lab 03/18/15 0514 03/18/15 1059 03/19/15 0620  NA 140  --  137  K 4.3  --  4.5  CL 109  --  106  CO2 23  --  22  GLUCOSE 264*  --  196*  BUN 39*  --  46*  CREATININE 1.54* 1.48* 1.62*  CALCIUM 8.6*  --  8.7*   Liver Function Tests:  Recent Labs Lab 03/18/15 0514 03/19/15 0620  AST 21 16  ALT 22 19  ALKPHOS 152* 140*  BILITOT 0.5 0.3  PROT 7.5 7.1  ALBUMIN 3.3* 3.3*   No results for input(s): LIPASE, AMYLASE in the last 168 hours. No results for input(s): AMMONIA in the last 168 hours. CBC:  Recent Labs Lab 03/18/15 0514 03/18/15 1059 03/19/15 0620  WBC 10.3 8.4 11.7*  NEUTROABS 7.9*  --   --   HGB 9.6* 10.0* 9.5*  HCT 30.4* 31.5* 30.0*  MCV  84.0 84.0 82.9  PLT 240 228 228   Cardiac Enzymes:  Recent Labs Lab 03/18/15 1059 03/18/15 1644  TROPONINI 0.03 <0.03   BNP:  Recent Labs Lab 03/14/15 1059  PROBNP 187.90*   D-Dimer: No results for input(s): DDIMER in the last 168 hours. CBG:  Recent Labs Lab 03/18/15 1639 03/18/15 1958 03/18/15 2149 03/18/15 2347 03/19/15 0356 03/19/15 0821  GLUCAP 346* 423* 384* 324* 233* 170*   Hemoglobin A1C: No results for input(s): HGBA1C in the last 168 hours. Fasting Lipid Panel: No results for input(s): CHOL, HDL, LDLCALC, TRIG, CHOLHDL, LDLDIRECT in the last 168 hours. Thyroid Function Tests: No results for input(s): TSH, T4TOTAL, FREET4, T3FREE, THYROIDAB in the last 168 hours. Coagulation: No results for input(s): LABPROT, INR in the last 168 hours. Anemia Panel:  Recent Labs Lab 03/19/15 0620  FERRITIN 75  TIBC 398  IRON 56   Urine Drug Screen: Drugs of Abuse     Component Value Date/Time   LABOPIA NONE DETECTED 03/20/2009 1702   COCAINSCRNUR NONE DETECTED 03/20/2009 1702   LABBENZ NONE DETECTED 03/20/2009 1702   AMPHETMU NONE DETECTED 03/20/2009 1702   THCU NONE DETECTED  03/20/2009 1702   LABBARB  03/20/2009 1702    NONE DETECTED        DRUG SCREEN FOR MEDICAL PURPOSES ONLY.  IF CONFIRMATION IS NEEDED FOR ANY PURPOSE, NOTIFY LAB WITHIN 5 DAYS.        LOWEST DETECTABLE LIMITS FOR URINE DRUG SCREEN Drug Class       Cutoff (ng/mL) Amphetamine      1000 Barbiturate      200 Benzodiazepine   [id number] Tricyclics       [id number] Opiates          300 Cocaine          300 THC              50    Alcohol Level: No results for input(s): ETH in the last 168 hours. Urinalysis: No results for input(s): COLORURINE, LABSPEC, PHURINE, GLUCOSEU, HGBUR, BILIRUBINUR, KETONESUR, PROTEINUR, UROBILINOGEN, NITRITE, LEUKOCYTESUR in the last 168 hours.  Invalid input(s): APPERANCEUR   Micro Results: Recent Results (from the past 240 hour(s))  MRSA PCR Screening      Status: None   Collection Time: 03/18/15  2:39 PM  Result Value Ref Range Status   MRSA by PCR NEGATIVE NEGATIVE Final    Comment:        The GeneXpert MRSA Assay (FDA approved for NASAL specimens only), is one component of a comprehensive MRSA colonization surveillance program. It is not intended to diagnose MRSA infection nor to guide or monitor treatment for MRSA infections.    Studies/Results: X-Fant Chest Pa And Lateral  03/19/2015   CLINICAL DATA:  History of asthma, acute respiratory failure, diabetes, CHF.  EXAM: CHEST  2 VIEW  COMPARISON:  Portable chest x-[place] of March 18, 2015  FINDINGS: The lungs are well-expanded. The pulmonary interstitial markings are more conspicuous today. The cardiac silhouette remains enlarged. The central pulmonary vascularity is mildly engorged. The mediastinum is normal in width. There is no significant pleural effusion. The bony thorax is unremarkable.  IMPRESSION: Mild pulmonary interstitial edema secondary to CHF. There is no alveolar pneumonia.   Electronically Signed   By: David  Martinique M.D.   On: 03/19/2015 07:40   Dg Chest Portable 1 View  03/18/2015   CLINICAL DATA:  Chest pain with shortness of breath for 2 weeks.  EXAM: PORTABLE CHEST - 1 VIEW  COMPARISON:  02/15/2015  FINDINGS: Normal heart size and pulmonary vascularity. Shallow inspiration. Probable atelectasis in the lung bases. Soft tissue attenuation also over the lower lungs. No focal consolidation. No blunting of costophrenic angles. No pneumothorax. Mediastinal contours appear intact.  IMPRESSION: Shallow inspiration with probable atelectasis in the lung bases.   Electronically Signed   By: Lucienne Capers M.D.   On: 03/18/2015 05:56   Medications: I have reviewed the patient's current medications. Scheduled Meds: . amLODipine  10 mg Oral Daily  . aspirin  325 mg Oral Daily  . atorvastatin  20 mg Oral Daily  . enoxaparin (LOVENOX) injection  40 mg Subcutaneous QHS  . famotidine   20 mg Oral Daily  . ferrous sulfate  325 mg Oral BID WC  . furosemide  40 mg Intravenous BID  . gabapentin  600 mg Oral QHS  . hydrALAZINE  10 mg Oral BID  . insulin aspart  0-15 Units Subcutaneous 6 times per day  . insulin glargine  30 Units Subcutaneous Q2200  . ketotifen  1 drop Both Eyes BID  . lisinopril  40 mg Oral Daily  . metoprolol tartrate  50 mg Oral BID  . valACYclovir  500 mg Oral Daily   Continuous Infusions: . albuterol 10 mg/hr (03/18/15 0658)   PRN Meds:.acetaminophen **OR** acetaminophen, ipratropium-albuterol Assessment/Plan: Asthma exacerbation: Repeat AM labs stable. Repeat EKG without change. Patient not requiring any supplemental O2 since yesterday. Patient wheezing and coughing resolved with breathing treatment and IV steroids, now breathing at baseline function.  - Discharge to home with PO prednisone taper - q4hrs scheduled duonebs  CHF, grade 2 diastolic dysfunction: Not likely primary cause of SOB, but may be multifactorial. Serial troponin negative x3. - Hold home coreg during asthma exacerbation, metoprolol 50mg  BID - Change to home dose lasix 40mg  PO BID  HTN - Continue home amlodipine 10mg  - Continue home Lisinopril 40mg   T2DM On 42 units lantus 12 units novolog TID as outpt, Hgb A1c poorly controlled but improving on outpatient trend last 8.2. Will lower from home dose due to NPO, to be adjusted particularly on steroid Rx. - Resume home insulin  HSV - Continue home valtrex 500mg  every other day - Continue home gabapentin 300mg  qHS  Dispo: Discharge to home today.  The patient does have a current PCP (Boykin Nearing, MD) and does not need an [facility] hospital follow-up appointment after discharge.  The patient does not have transportation limitations that hinder transportation to clinic appointments.   LOS: 1 day   Collier Salina, MD 03/19/2015, 11:00 AM

## 2015-03-19 NOTE — Telephone Encounter (Signed)
Transitional Care Clinic Care Coordination Note:  This CM met w/ the patient prior to her discharge.  Admit date:  03/18/2015 Discharge date: 03/19/2015 Discharge Disposition: home Patient contact: # [phone] Emergency contact(s): Jules Schick (sister) # [phone]  This Case Manager reviewed patient's EMR and determined patient would benefit from post-discharge medical management and chronic care management services through the [facility]. Patient has a history of asthma, acute respiratory failure with hypoxia, diastolic CHF, HTN, OSA, DM  - uncontrolled, Hep C, diabetic neuropathy.  She was admitted with DOE x 2 weeks and shortness of breath and chest pain x 1 day.  She was treated for exacerbation of her asthma. In the past year, she had 3 -ED visits and 3 hospital admissions. This Case Manager met with patient to discuss the services and medical management that can be provided at the [facility]. Patient verbalized understanding and agreed to receive post-discharge care at the [facility].   Patient scheduled for Transitional Care appointment on 03/25/2015 at 1200 w/ Dr Jarold Song.  Clinic information and appointment time provided to patient. Appointment information also placed on AVS.  Assessment:       Home Environment: Lives alone but stated that her sister and brother live close by.        Support System: family that lives close to her       Level of functioning: independent.        Home DME: none, except for a glucometer. She noted that she runs out of testing supplies because she was supposed to be checking her blood sugar TID. She said that she is supposed to have a CPAP but she can't afford one.        Home care services: (services arranged prior to discharge or new services after discharge) - none       Transportation: She stated that she drives.         Food/Nutrition: (ability to afford, access, use of any community resources) - she stated that  she "gets by."  She does not receive food stamps at this time but had been receiving them in the past when she lived in [place].         Medications: (ability to afford, access, compliance, Pharmacy used)  She uses the [facility] pharmacy.  She said that she has been getting her insulin for free from the clinic. She also noted that she does not always gets all of her medications because they can cost too much.          Identified Barriers: no insurance, no CPAP, does not always have all of her medications and blood glucose testing supplies.         PCP (Name, office location, phone number):  Dr Adrian Blackwater [facility]  Patient Education:  Explained the services provided at the [facility], including pharmacy assistance and  assistance with obtaining glucose testing supplies,  social work and financial counseling.  Provided her with the application for the [facility] and [employer] as well as the walk in hours for financial assistance at [facility].              Arranged services:  A TCC appointment was scheduled for her on 03/25/15 @ 1200 w/ Dr Jarold Song.  She said that she will have transportation to the appointment.    Services communicated to Tomi Bamberger, CM

## 2015-03-20 ENCOUNTER — Telehealth: Payer: Self-pay

## 2015-03-20 ENCOUNTER — Other Ambulatory Visit: Payer: Medicaid Other

## 2015-03-20 NOTE — Telephone Encounter (Signed)
[facility] Post-discharge Follow-Up Phone Call:  Date of Discharge: 03/19/2015 Principal Discharge Diagnosis(es):asthma exacerbation Post-discharge Communication: call placed to the patient  Call Completed: Yes                    With Whom:Patient Interpreter Needed: No                    Please check all that apply:  X Patient is knowledgeable of his/her condition(s) and/or treatment. X Patient is caring for self at home.  ? Patient is receiving assist at home from family and/or caregiver. Family and/or caregiver is knowledgeable of patient's condition(s) and/or treatment. ? Patient is receiving home health services. If so, name of agency.     Medication Reconciliation:  ? Medication list reviewed with patient. X Patient obtained all discharge medications.  - She stated that she has all of her medications and does not have any questions about them.  She explained how the prednisone taper works and reports compliance with taking her medications.    Activities of Daily Living:  X Independent ? Needs assist (describe; ? home DME used) ? Total Care (describe, ? home DME used)   Community resources in place for patient:  X None  ? Home Health/Home DME ? Assisted Living ? Support Group          Patient Education: Reviewed the process for walk in hours for the [employer] and [employer]. Explained that she was given the packet of information for the 2 programs and the information that she needs to bring to apply for the programs is listed in the packet. She verbalized understanding and stated that she would follow up as needed to apply for the assistance.   She then stated that a lab called her about labs that Dr Adrian Blackwater had ordered. She said that she explained to the lab that she does not have medicaid and she is concerned about the cost of the lab work. This CM explained to her that the lab is different from the [facility] re.the billing. She said that the lab told her  that she has an appointment tomorrow, 03/21/15 @1445 . This CM instructed her to call the lab in the morning and to explain that she has no insurance and is going to see a Development worker, community at [facility] re.the [place] and to inquire about any financial assistance program that they have for their laboratory services.  She said that she would call the lab in the morning.  An update was provided to Albertha Ghee, CMA w/ Dr Adrian Blackwater re.the patient's concerns about the lab order.         Questions/Concerns discussed: The patient had no other questions/concerns and stated that she is aware of her appointment at [facility] on 03/25/15 @1200 .  She also confirmed that she would have transportation to the clinic.

## 2015-03-21 ENCOUNTER — Telehealth: Payer: Self-pay | Admitting: *Deleted

## 2015-03-21 ENCOUNTER — Other Ambulatory Visit: Payer: Medicaid Other

## 2015-03-21 NOTE — Telephone Encounter (Signed)
Date of Birth verified by pt Lab results given Pt verbalized understanding  Advised to continue taking current medication

## 2015-03-21 NOTE — Telephone Encounter (Signed)
-----   Message from Boykin Nearing, MD sent at 03/15/2015  9:22 AM EDT ----- Elevated proBNP consistent with fluid overload Stable Hgb Normal potassium Continue current care plan

## 2015-03-22 ENCOUNTER — Other Ambulatory Visit: Payer: Self-pay | Admitting: Family Medicine

## 2015-03-22 ENCOUNTER — Telehealth: Payer: Self-pay

## 2015-03-22 NOTE — Telephone Encounter (Signed)
This Case Manager placed call to patient to check on status. Patient indicated she was "doing okay." She indicated she was experiencing some shortness of breath when moving around but overall breathing has greatly improved. Discussed importance of medication compliance and reminded patient to use albuterol inhaler every four hours as needed for wheezing. Patient verbalized understanding. Patient indicated she had all medications and was taking them as prescribed. Patient also taking prednisone as prescribed.  Patient reminded of appointment on 03/25/15 at 1200 with Dr. Jarold Song.  Patient aware of appointment and indicates she has transportation to appointment. No additional needs identified.

## 2015-03-25 ENCOUNTER — Encounter (HOSPITAL_BASED_OUTPATIENT_CLINIC_OR_DEPARTMENT_OTHER): Payer: Self-pay | Admitting: Clinical

## 2015-03-25 ENCOUNTER — Encounter: Payer: Self-pay | Admitting: Family Medicine

## 2015-03-25 ENCOUNTER — Ambulatory Visit: Payer: Self-pay | Attending: Family Medicine | Admitting: Family Medicine

## 2015-03-25 VITALS — BP 176/76 | HR 60 | Temp 98.3°F | Ht 68.0 in | Wt 283.0 lb

## 2015-03-25 DIAGNOSIS — I10 Essential (primary) hypertension: Secondary | ICD-10-CM

## 2015-03-25 DIAGNOSIS — Z87891 Personal history of nicotine dependence: Secondary | ICD-10-CM | POA: Insufficient documentation

## 2015-03-25 DIAGNOSIS — R739 Hyperglycemia, unspecified: Secondary | ICD-10-CM

## 2015-03-25 DIAGNOSIS — I5032 Chronic diastolic (congestive) heart failure: Secondary | ICD-10-CM

## 2015-03-25 DIAGNOSIS — Z1389 Encounter for screening for other disorder: Secondary | ICD-10-CM

## 2015-03-25 DIAGNOSIS — D649 Anemia, unspecified: Secondary | ICD-10-CM

## 2015-03-25 DIAGNOSIS — K219 Gastro-esophageal reflux disease without esophagitis: Secondary | ICD-10-CM

## 2015-03-25 DIAGNOSIS — Z794 Long term (current) use of insulin: Secondary | ICD-10-CM | POA: Insufficient documentation

## 2015-03-25 DIAGNOSIS — Z1331 Encounter for screening for depression: Secondary | ICD-10-CM

## 2015-03-25 DIAGNOSIS — J45901 Unspecified asthma with (acute) exacerbation: Secondary | ICD-10-CM

## 2015-03-25 DIAGNOSIS — E1165 Type 2 diabetes mellitus with hyperglycemia: Secondary | ICD-10-CM

## 2015-03-25 LAB — POCT URINALYSIS DIPSTICK
Bilirubin, UA: NEGATIVE
Glucose, UA: 250
Ketones, UA: NEGATIVE
Leukocytes, UA: NEGATIVE
Nitrite, UA: NEGATIVE
Protein, UA: >=300
Spec Grav, UA: 1.02
Urobilinogen, UA: 0.2
pH, UA: 5.5

## 2015-03-25 LAB — BASIC METABOLIC PANEL
BUN: 48 mg/dL — ABNORMAL HIGH (ref 7–25)
CO2: 25 mmol/L (ref 20–31)
Calcium: 8.4 mg/dL — ABNORMAL LOW (ref 8.6–10.4)
Chloride: 103 mmol/L (ref 98–110)
Creat: 1.47 mg/dL — ABNORMAL HIGH (ref 0.50–1.05)
Glucose, Bld: 385 mg/dL — ABNORMAL HIGH (ref 65–99)
Potassium: 4.8 mmol/L (ref 3.5–5.3)
Sodium: 137 mmol/L (ref 135–146)

## 2015-03-25 LAB — GLUCOSE, POCT (MANUAL RESULT ENTRY)
POC Glucose: 438 mg/dl — AB (ref 70–99)
POC Glucose: 406 mg/dl — AB (ref 70–99)

## 2015-03-25 MED ORDER — INSULIN ASPART 100 UNIT/ML ~~LOC~~ SOLN
10.0000 [IU] | Freq: Once | SUBCUTANEOUS | Status: AC
  Administered 2015-03-25: 10 [IU] via SUBCUTANEOUS

## 2015-03-25 MED ORDER — FLUTICASONE-SALMETEROL 250-50 MCG/DOSE IN AEPB: 1.0000 | INHALATION_SPRAY | Freq: Two times a day (BID) | RESPIRATORY_TRACT | Status: DC

## 2015-03-25 MED ORDER — ALBUTEROL SULFATE (2.5 MG/3ML) 0.083% IN NEBU: 2.5000 mg | INHALATION_SOLUTION | Freq: Four times a day (QID) | RESPIRATORY_TRACT | Status: DC | PRN

## 2015-03-25 MED ORDER — INSULIN GLARGINE 100 UNIT/ML SOLOSTAR PEN: 50.0000 [IU] | PEN_INJECTOR | Freq: Every day | SUBCUTANEOUS | Status: DC

## 2015-03-25 MED ORDER — INSULIN ASPART 100 UNIT/ML ~~LOC~~ SOLN: 10.0000 [IU] | Freq: Once | SUBCUTANEOUS | Status: DC

## 2015-03-25 NOTE — Progress Notes (Signed)
[place]  PCP: Dr Adrian Blackwater  Date of Encounter: 03/20/15  Admit date:03/18/15 Discharge date: 03/19/15  Subjective:  Patient ID: [name], female    DOB: 1955/10/01  Age: 59 y.o. MRN: [id number]  CC: Follow-up   HPI [name] is a 59 year old female with a history of uncontrolled type 2 diabetes mellitus, hypertension, diastolic heart failure, GERD with 3 hospitalizations in the last 6 months who comes in for follow-up visit to transitional care clinic. She had presented to the ED [facility] with shortness of breath and chest pain, cough productive of yellowish sputum, wheezing, increased leg swelling and a 7 pound weight gain. Her symptoms were relieved by using her albuterol MDI. ABG obtained in ED with a pO2 of 42. IV steroids, albuterol nebulizer, BIPAP at ED with improvement in dyspnea by time of evaluation by admitting service. Chest x-[place] was not concerning for pneumonia. Respiratory status improved with resulting improvement in pO2 on ABG. BNP was 70 thus ruling out cardiac etiology, Lasix was increased to 40 mg IV twice a day during her hospitalization. Her condition improved and she was subsequently discharged on a prednisone taper.  Interval history: She complains of feeling short of breath since discharge and has also remained lightheaded and this is worse with changing position but she denies vertigo or tinnitus. Her blood sugars have also been running in the 400-500 range despite compliance with her Lantus and NovoLog.  Outpatient Prescriptions Prior to Visit  Medication Sig Dispense Refill  . albuterol (PROAIR HFA) 108 (90 BASE) MCG/ACT inhaler Inhale 2 puffs into the lungs every 4 (four) hours as needed for wheezing. 3 Inhaler 3  . amLODipine (NORVASC) 10 MG tablet Take 1 tablet (10 mg total) by mouth daily. 30 tablet 3  . aspirin 325 MG tablet Take 1 tablet (325 mg total) by mouth daily. 30 tablet 3  . atorvastatin (LIPITOR) 20 MG tablet Take 1 tablet  (20 mg total) by mouth daily. 30 tablet 3  . Blood Glucose Monitoring Suppl (TRUE METRIX METER) W/DEVICE KIT 1 each by Does not apply route as needed. (Patient taking differently: 1 each by Other route See admin instructions. Check blood sugar 3 times daily) 1 kit 0  . carvedilol (COREG) 25 MG tablet Take 1 tablet (25 mg total) by mouth 2 (two) times daily with a meal. 60 tablet 6  . furosemide (LASIX) 40 MG tablet Take 1 tablet (40 mg total) by mouth 2 (two) times daily. 8 AM and 2 PM 60 tablet 2  . gabapentin (NEURONTIN) 300 MG capsule Take 1-2 capsules (300-600 mg total) by mouth at bedtime. 60 capsule 2  . glucose blood (TRUE METRIX BLOOD GLUCOSE TEST) test strip 1 each by Other route 3 (three) times daily. 100 each 11  . hydrALAZINE (APRESOLINE) 10 MG tablet Take 1 tablet (10 mg total) by mouth 2 (two) times daily. NEED OV. 30 tablet 2  . insulin aspart (NOVOLOG) 100 UNIT/ML injection Inject 12 Units into the skin 3 (three) times daily before meals. 10 mL 3  . ketotifen (ZADITOR) 0.025 % ophthalmic solution Place 1 drop into both eyes 2 (two) times daily. 10 mL 1  . lisinopril (PRINIVIL,ZESTRIL) 40 MG tablet Take 1 tablet (40 mg total) by mouth daily. 30 tablet 3  . potassium chloride 20 MEQ TBCR Take 20 mEq by mouth 2 (two) times daily. With lasix (Patient taking differently: Take 20 mEq by mouth daily. With lasix) 60 tablet 2  . predniSONE (DELTASONE)  20 MG tablet Take 1 tablet (20 mg total) by mouth as directed. Take as directed for a total of 8 days for your asthma treatment: 3 pills (52m) for 2 days 2 pills (496m for 2 days 1 pills (2038mfor 2 days 1/2 pill (1m35mor 2 days 13 tablet 0  . ranitidine (ZANTAC) 150 MG tablet Take 1 tablet (150 mg total) by mouth 2 (two) times daily. 60 tablet 2  . valACYclovir (VALTREX) 500 MG tablet Take 1 tablet (500 mg total) by mouth daily. 30 tablet 2  . Insulin Glargine (LANTUS SOLOSTAR) 100 UNIT/ML Solostar Pen Inject 42 Units into the skin daily  at 10 pm. 5 pen 6   No facility-administered medications prior to visit.    ROS Review of Systems General: negative for fever, weight loss, appetite change Eyes: no visual symptoms. ENT: no ear symptoms, no sinus tenderness, no nasal congestion or sore throat, no vertigo, no tinnitus Neck: no pain  Respiratory: no wheezing, +shortness of breath, cough Cardiovascular: no chest pain, no dyspnea on exertion, no pedal edema, no orthopnea. Gastrointestinal: no abdominal pain, no diarrhea, no constipation Genito-Urinary: no urinary frequency, no dysuria, no polyuria. Hematologic: no bruising Endocrine: no cold or heat intolerance Neurological: no headaches, no seizures, no tremors, +lightheadedness Musculoskeletal: no joint pains, no joint swelling Skin: no pruritus, no rash. Psychological: no depression, no anxiety,   Objective:  BP 176/76 mmHg  Pulse 60  Temp(Src) 98.3 F (36.8 C)  Ht 5' 8"  (1.727 m)  Wt 283 lb (128.368 kg)  BMI 43.04 kg/m2  SpO2 100%  BP/Weight 03/25/2015 9/6/12/12/5636/9/3/7342stolic BP 176 876 811 572astolic BP 76 63 76  Wt. (Lbs) 283 192.02 287  BMI 43.04 29.2 43.65    Lab Results  Component Value Date   WBC 11.7* 03/19/2015   HGB 9.5* 03/19/2015   HCT 30.0* 03/19/2015   PLT 228 03/19/2015   GLUCOSE 196* 03/19/2015   CHOL 152 03/25/2012   TRIG 101 03/25/2012   HDL 40 03/25/2012   LDLCALC 92 03/25/2012   ALT 19 03/19/2015   AST 16 03/19/2015   NA 137 03/19/2015   K 4.5 03/19/2015   CL 106 03/19/2015   CREATININE 1.62* 03/19/2015   BUN 46* 03/19/2015   CO2 22 03/19/2015   TSH 1.254 03/26/2012   HGBA1C 8.20 01/24/2015   MICROALBUR 237.2* 11/08/2014    Physical Exam Constitutional: normal appearing,  Eyes: PERRLA HEENT: Head is atraumatic, normal sinuses, normal oropharynx, normal appearing tonsils and palate, tympanic membrane is normal bilaterally. Neck: normal range of motion, no thyromegaly, no JVD Cardiovascular: normal rate and  rhythm, normal heart sounds, no murmurs, rub or gallop, 2+ bilateral non pitting pedal edema Respiratory: clear to auscultation bilaterally, no wheezes, no rales, no rhonchi Abdomen: soft, not tender to palpation, normal bowel sounds, no enlarged organs Extremities: Full ROM, no tenderness in joints Skin: warm and dry, no lesions. Neurological: alert, oriented x3, cranial nerves I-XII grossly intact , normal motor strength, normal sensation. Psychological: normal mood.   Assessment & Plan:   1. Type 2 diabetes mellitus with hyperglycemia  uncontrolled with A1c of 8.2 from 7/ 2016  current hyperglycemia could be secondary to prednisone ; NovoLog 10 units administered and  Patient observed with a repeat  CBG performed after 30 minutes. Lantus increased to 50 units at bedtime and NovoLog 12 units 3 times daily head AND she has been placed on a NovoLog sliding scale until her next visit. - Glucose (  CBG) - Basic Metabolic Panel - Insulin Glargine (LANTUS SOLOSTAR) 100 UNIT/ML Solostar Pen; Inject 50 Units into the skin daily at 10 pm.  Dispense: 5 pen; Refill: 6 - insulin aspart (novoLOG) injection 10 Units; Inject 0.1 mLs (10 Units total) into the skin once.  2. Hyperglycemia UA negative for ketones - Urinalysis Dipstick - insulin aspart (novoLOG) injection 10 Units; Inject 0.1 mLs (10 Units total) into the skin once. - Glucose (CBG)  3. Anemia, unspecified anemia type Stable  4. Asthma exacerbation Uncontrolled Complete Prednisone taper  (Day 6 out of 8) and commence Advair Current Dyspnea and lightheadedness could be secondary to deconditioning Will reassess for improvement at next visit   5. Chronic diastolic CHF (congestive heart failure), NYHA class 2 Continue Lasix Advised on daily weight checks, low sodium Taking daily rather than bid dosing of Potassium and so i will check a BMET today  6. Gastroesophageal reflux disease without esophagitis Stable  7. Uncontrolled  hypertension Elevation likely due to steroid use.  No changes at this time and I will reassess at her next visit.   Meds ordered this encounter  Medications  . insulin aspart (novoLOG) injection 10 Units    Sig:   . Insulin Glargine (LANTUS SOLOSTAR) 100 UNIT/ML Solostar Pen    Sig: Inject 50 Units into the skin daily at 10 pm.    Dispense:  5 pen    Refill:  6    Discontinue 42 units  . albuterol (PROVENTIL) (2.5 MG/3ML) 0.083% nebulizer solution    Sig: Take 3 mLs (2.5 mg total) by nebulization every 6 (six) hours as needed for wheezing or shortness of breath.    Dispense:  150 mL    Refill:  1  . insulin aspart (novoLOG) injection 10 Units    Sig:   . Fluticasone-Salmeterol (ADVAIR DISKUS) 250-50 MCG/DOSE AEPB    Sig: Inhale 1 puff into the lungs 2 (two) times daily.    Dispense:  60 each    Refill:  1    Follow-up: Return in about 2 weeks (around 04/08/2015) for TCC follow up with Dr Jarold Song.   Arnoldo Morale MD

## 2015-03-25 NOTE — Progress Notes (Signed)
ASSESSMENT: Pt currently experiencing mild symptoms of depression. Pt needs to f/u with PCP and [facility]; would benefit from psychoeducation and supportive counseling regarding coping with symptoms of depression.  Stage of Change: contemplative  PLAN: 1. F/U with behavioral health consultant in as needed 2. Psychiatric Medications: neurontin. 3. Behavioral recommendation(s):   -Consider reading educational material regarding coping with symptoms of depression  SUBJECTIVE: Pt. referred by Dr Jarold Song for nebulizer resource and symptoms of depression:  Pt. reports the following symptoms/concerns: Pt states that she has used a nebulizer in the past, and that she another one; sometimes she feels depressed, as a result of her decreasing health Duration of problem: one week Severity: moderate  OBJECTIVE: Orientation & Cognition: Oriented x3. Thought processes normal and appropriate to situation. Mood: appropriate. Affect: appropriate Appearance: appropriate Risk of harm to self or others: no risk of harm to self or others Substance use: alcohol Assessments administered: PHQ9: 9/ GAD7: 4  Diagnosis: Screening for depression CPT Code: Z13.89 -------------------------------------------- Other(s) present in the room: none  Time spent with patient in exam room: 16 minutes

## 2015-03-25 NOTE — Progress Notes (Signed)
Patient complains of shortness of breath even when doing simple things like talking Her CBG today is 438 He blood pressure today is 172/76 her heart rate is 60

## 2015-03-26 ENCOUNTER — Telehealth: Payer: Self-pay

## 2015-03-26 ENCOUNTER — Other Ambulatory Visit: Payer: Self-pay | Admitting: Family Medicine

## 2015-03-26 DIAGNOSIS — I5033 Acute on chronic diastolic (congestive) heart failure: Secondary | ICD-10-CM

## 2015-03-26 MED ORDER — POTASSIUM CHLORIDE ER 20 MEQ PO TBCR: 20.0000 meq | EXTENDED_RELEASE_TABLET | Freq: Every day | ORAL | Status: DC

## 2015-03-26 NOTE — Telephone Encounter (Signed)
Attempted to contact the patient to check on her status, schedule a follow up appointment and inquire about picking up her nebulizer.  Call placed to # [phone] (M) and a voice mail message was left requesting a call back to # [phone] or [phone].

## 2015-03-26 NOTE — Telephone Encounter (Signed)
Please disregard the prior telephone call documentation.  It was not intended for this patient.

## 2015-03-26 NOTE — Progress Notes (Signed)
Quick Note:  Potassium is normal, continue daily dosing of potassium; renal function is improving. ______

## 2015-03-27 ENCOUNTER — Telehealth: Payer: Self-pay | Admitting: *Deleted

## 2015-03-27 NOTE — Telephone Encounter (Signed)
Left HIPAA compliant message for patient to call RN at [phone]

## 2015-03-27 NOTE — Telephone Encounter (Signed)
Verified name and date of birth told patient Potassium is normal, continue daily dosing of potassium; renal function is improving.  Patient verbalized understanding.

## 2015-03-27 NOTE — Telephone Encounter (Signed)
-----   Message from Arnoldo Morale, MD sent at 03/26/2015  8:16 AM EDT ----- Potassium is normal, continue daily dosing of potassium; renal function is improving.

## 2015-04-02 ENCOUNTER — Ambulatory Visit: Payer: Medicaid Other | Admitting: Family Medicine

## 2015-04-05 ENCOUNTER — Telehealth: Payer: Self-pay

## 2015-04-05 NOTE — Telephone Encounter (Signed)
This Case Manager placed call to patient to check on status and to remind her of her [facility] follow-up appointment. After inquiring about her status, patient indicates the "swelling in her feet is getting worse."  She indicates swelling has increased since discharge and since her appointment with Dr. Jarold Song on 03/25/15. She indicates she is not usually short of breath; she only gets short of breath with strenuous activity.  Patient indicated she is being compliant with lasix twice daily. She also indicated she is adhering to a low sodium diet, but she is not weighing herself daily since she does not have a scale. Spoke with Dr. Jarold Song about patient's swelling in her lower extremities. She reviewed patient's EMR and medication list and indicated she would not change patient's medications at this time. She wants to examine patient on Monday.  Updated patient and stressed importance of her coming to her appointment on 04/08/15. Patient verbalized understanding.  Inquired if patient taking medications as prescribed. She indicated she was. Discussed importance of medication compliance.  Patient also indicated she was checking blood glucose as directed by physician. Reminded patient to keep a blood glucose log and bring it into her appointment. Patient indicated she was keeping a blood glucose log, and she stated she would bring log to her appointment on 04/08/15. Patient indicates she has transportation to her appointment on 04/08/15. No additional needs identified at this time.

## 2015-04-06 ENCOUNTER — Other Ambulatory Visit: Payer: Self-pay | Admitting: Physician Assistant

## 2015-04-06 ENCOUNTER — Other Ambulatory Visit: Payer: Self-pay | Admitting: Family Medicine

## 2015-04-08 ENCOUNTER — Encounter: Payer: Self-pay | Admitting: Family Medicine

## 2015-04-08 ENCOUNTER — Ambulatory Visit: Payer: Self-pay | Attending: Family Medicine | Admitting: Family Medicine

## 2015-04-08 VITALS — BP 135/82 | HR 63 | Temp 97.7°F | Ht 68.0 in | Wt 284.6 lb

## 2015-04-08 DIAGNOSIS — D649 Anemia, unspecified: Secondary | ICD-10-CM | POA: Insufficient documentation

## 2015-04-08 DIAGNOSIS — R600 Localized edema: Secondary | ICD-10-CM

## 2015-04-08 DIAGNOSIS — N183 Chronic kidney disease, stage 3 unspecified: Secondary | ICD-10-CM

## 2015-04-08 DIAGNOSIS — I129 Hypertensive chronic kidney disease with stage 1 through stage 4 chronic kidney disease, or unspecified chronic kidney disease: Secondary | ICD-10-CM | POA: Insufficient documentation

## 2015-04-08 DIAGNOSIS — I5032 Chronic diastolic (congestive) heart failure: Secondary | ICD-10-CM | POA: Insufficient documentation

## 2015-04-08 DIAGNOSIS — Z79899 Other long term (current) drug therapy: Secondary | ICD-10-CM | POA: Insufficient documentation

## 2015-04-08 DIAGNOSIS — E1122 Type 2 diabetes mellitus with diabetic chronic kidney disease: Secondary | ICD-10-CM | POA: Insufficient documentation

## 2015-04-08 DIAGNOSIS — E1143 Type 2 diabetes mellitus with diabetic autonomic (poly)neuropathy: Secondary | ICD-10-CM | POA: Insufficient documentation

## 2015-04-08 DIAGNOSIS — I10 Essential (primary) hypertension: Secondary | ICD-10-CM

## 2015-04-08 DIAGNOSIS — J4530 Mild persistent asthma, uncomplicated: Secondary | ICD-10-CM | POA: Insufficient documentation

## 2015-04-08 DIAGNOSIS — E1165 Type 2 diabetes mellitus with hyperglycemia: Secondary | ICD-10-CM | POA: Insufficient documentation

## 2015-04-08 DIAGNOSIS — K3184 Gastroparesis: Secondary | ICD-10-CM | POA: Insufficient documentation

## 2015-04-08 DIAGNOSIS — Z794 Long term (current) use of insulin: Secondary | ICD-10-CM | POA: Insufficient documentation

## 2015-04-08 DIAGNOSIS — K219 Gastro-esophageal reflux disease without esophagitis: Secondary | ICD-10-CM | POA: Insufficient documentation

## 2015-04-08 DIAGNOSIS — Z23 Encounter for immunization: Secondary | ICD-10-CM

## 2015-04-08 DIAGNOSIS — R609 Edema, unspecified: Secondary | ICD-10-CM | POA: Insufficient documentation

## 2015-04-08 DIAGNOSIS — Z7982 Long term (current) use of aspirin: Secondary | ICD-10-CM | POA: Insufficient documentation

## 2015-04-08 LAB — GLUCOSE, POCT (MANUAL RESULT ENTRY): POC Glucose: 291 mg/dl — AB (ref 70–99)

## 2015-04-08 MED ORDER — HYDRALAZINE HCL 25 MG PO TABS: 25.0000 mg | ORAL_TABLET | Freq: Two times a day (BID) | ORAL | Status: DC

## 2015-04-08 MED ORDER — ALBUTEROL SULFATE HFA 108 (90 BASE) MCG/ACT IN AERS: 2.0000 | INHALATION_SPRAY | RESPIRATORY_TRACT | Status: DC | PRN

## 2015-04-08 NOTE — Progress Notes (Signed)
Patient here to follow up She complains of R>L pedal edema that is painful in nature and is growing worse-she is taking her furosemide She also states she is "having trouble with my breathing.  When I eat, it feels like something is shutting down in my stomach." She needs refills on her albuterol inhaler, novolog,neurontin.,furosemide CBG 291 took 4 units of Novolog this am and ate breakfast at 800 No depression,anxiety,ETOH, smoking,drug use

## 2015-04-08 NOTE — Progress Notes (Signed)
[place]  Date of first encounter: 03/25/2015  Date of telephone encounter: 03/20/2015  PCP: Dr Adrian Blackwater  Subjective:  Patient ID: [name], female    DOB: 01/04/1956  Age: 59 y.o. MRN: [id number]  CC: Follow-up and Foot Swelling   HPI [name] is a 59 year old female with a history of uncontrolled type 2 diabetes mellitus (A1c 8.4 from 01/2015) hypertension, CHF (NYHA class II, EF 55-60% from 02/2015), asthma, CK D stage III, anemia, GERD with recent hospitalization for respiratory failure secondary to asthma exacerbation who comes into the clinic for a follow-up.  Today she reports pedal edema right worse than left leg and associated shortness of breath which is associated with eating. Of note she just completed a course of prednisone which she was discharged with after her last hospitalization 2 weeks ago. Review of her weight indicates she has gained only 1 pound in the last 2 weeks. She admits to compliance with all her medications and is needing a refill on her albuterol inhaler.  Her blood sugars have improved since completion of the prednisone and this morning she took 4 units of NovoLog with her breakfast Denies chest pain, wheezing, fever, upper respiratory symptoms.  Outpatient Prescriptions Prior to Visit  Medication Sig Dispense Refill  . albuterol (PROVENTIL) (2.5 MG/3ML) 0.083% nebulizer solution Take 3 mLs (2.5 mg total) by nebulization every 6 (six) hours as needed for wheezing or shortness of breath. 150 mL 1  . amLODipine (NORVASC) 10 MG tablet Take 1 tablet (10 mg total) by mouth daily. 30 tablet 3  . aspirin 325 MG tablet Take 1 tablet (325 mg total) by mouth daily. 30 tablet 3  . atorvastatin (LIPITOR) 20 MG tablet Take 1 tablet (20 mg total) by mouth daily. 30 tablet 3  . Blood Glucose Monitoring Suppl (TRUE METRIX METER) W/DEVICE KIT 1 each by Does not apply route as needed. (Patient taking differently: 1 each by Other route See admin  instructions. Check blood sugar 3 times daily) 1 kit 0  . carvedilol (COREG) 25 MG tablet Take 1 tablet (25 mg total) by mouth 2 (two) times daily with a meal. 60 tablet 6  . Fluticasone-Salmeterol (ADVAIR DISKUS) 250-50 MCG/DOSE AEPB Inhale 1 puff into the lungs 2 (two) times daily. 60 each 1  . furosemide (LASIX) 40 MG tablet Take 1 tablet (40 mg total) by mouth 2 (two) times daily. 8 AM and 2 PM 60 tablet 2  . gabapentin (NEURONTIN) 300 MG capsule Take 1-2 capsules (300-600 mg total) by mouth at bedtime. 60 capsule 2  . glucose blood (TRUE METRIX BLOOD GLUCOSE TEST) test strip 1 each by Other route 3 (three) times daily. 100 each 11  . insulin aspart (NOVOLOG) 100 UNIT/ML injection Inject 12 Units into the skin 3 (three) times daily before meals. 10 mL 3  . Insulin Glargine (LANTUS SOLOSTAR) 100 UNIT/ML Solostar Pen Inject 50 Units into the skin daily at 10 pm. 5 pen 6  . ketotifen (ZADITOR) 0.025 % ophthalmic solution Place 1 drop into both eyes 2 (two) times daily. 10 mL 1  . lisinopril (PRINIVIL,ZESTRIL) 40 MG tablet Take 1 tablet (40 mg total) by mouth daily. 30 tablet 3  . Potassium Chloride ER 20 MEQ TBCR Take 20 mEq by mouth daily. With lasix 60 tablet 1  . ranitidine (ZANTAC) 150 MG tablet Take 1 tablet (150 mg total) by mouth 2 (two) times daily. 60 tablet 2  . valACYclovir (VALTREX) 500 MG tablet Take  1 tablet (500 mg total) by mouth daily. 30 tablet 2  . albuterol (PROAIR HFA) 108 (90 BASE) MCG/ACT inhaler Inhale 2 puffs into the lungs every 4 (four) hours as needed for wheezing. 3 Inhaler 3  . hydrALAZINE (APRESOLINE) 10 MG tablet Take 1 tablet (10 mg total) by mouth 2 (two) times daily. NEED OV. 30 tablet 2  . predniSONE (DELTASONE) 20 MG tablet Take 1 tablet (20 mg total) by mouth as directed. Take as directed for a total of 8 days for your asthma treatment: 3 pills ($Remove'60mg'yduvgTm$ ) for 2 days 2 pills ($Remove'40mg'ihgLkWh$ ) for 2 days 1 pills ($Remove'20mg'EmTXzMr$ ) for 2 days 1/2 pill ($RemoveB'10mg'OdtQJDLl$ ) for 2 days 13 tablet 0    Facility-Administered Medications Prior to Visit  Medication Dose Route Frequency Provider Last Rate Last Dose  . insulin aspart (novoLOG) injection 10 Units  10 Units Subcutaneous Once Arnoldo Morale, MD        ROS Review of Systems General: negative for fever, weight loss, appetite change Eyes: no visual symptoms. ENT: no ear symptoms, no sinus tenderness, no nasal congestion or sore throat, no vertigo, no tinnitus Neck: no pain  Respiratory: no wheezing, +shortness of breath, cough Cardiovascular: no chest pain, no dyspnea on exertion, + pedal edema, no orthopnea. Gastrointestinal: no abdominal pain, no diarrhea, no constipation Genito-Urinary: no urinary frequency, no dysuria, no polyuria. Hematologic: no bruising Endocrine: no cold or heat intolerance Neurological: no headaches, no seizures, no tremors, +lightheadedness Musculoskeletal: no joint pains, no joint swelling Skin: no pruritus, no rash. Psychological: no depression, no anxiety,  Objective:  BP 135/82 mmHg  Pulse 63  Temp(Src) 97.7 F (36.5 C)  Ht $R'5\' 8"'Wt$  (1.727 m)  Wt 284 lb 9.6 oz (129.094 kg)  BMI 43.28 kg/m2  SpO2 96%  BP/Weight 04/08/2015 0/35/0093 02/10/8298  Systolic BP 371 696 789  Diastolic BP 82 76 63  Wt. (Lbs) 284.6 283 192.02  BMI 43.28 43.04 29.2    Lab Results  Component Value Date   WBC 11.7* 03/19/2015   HGB 9.5* 03/19/2015   HCT 30.0* 03/19/2015   PLT 228 03/19/2015   GLUCOSE 385* 03/25/2015   CHOL 152 03/25/2012   TRIG 101 03/25/2012   HDL 40 03/25/2012   LDLCALC 92 03/25/2012   ALT 19 03/19/2015   AST 16 03/19/2015   NA 137 03/25/2015   K 4.8 03/25/2015   CL 103 03/25/2015   CREATININE 1.47* 03/25/2015   BUN 48* 03/25/2015   CO2 25 03/25/2015   TSH 1.254 03/26/2012   HGBA1C 8.20 01/24/2015   MICROALBUR 237.2* 11/08/2014    Physical Exam Constitutional: normal appearing,  Eyes: Periorbital edema  HEENT: Head is atraumatic, normal sinuses, normal oropharynx, normal  appearing tonsils and palate, tympanic membrane is normal bilaterally. Neck: normal range of motion, no thyromegaly, no JVD Cardiovascular: normal rate and rhythm, normal heart sounds, no murmurs, rub or gallop, 2+ bilateral non pitting pedal edema, right worse than left Respiratory: clear to auscultation bilaterally, no wheezes, no rales, no rhonchi Abdomen: soft, not tender to palpation, normal bowel sounds, no enlarged organs Extremities: Full ROM, no tenderness in joints Skin: warm and dry, no lesions. Neurological: alert, oriented x3, cranial nerves I-XII grossly intact , normal motor strength, normal sensation. Psychological: normal mood.  Assessment & Plan:   1. Type 2 diabetes mellitus with hyperglycemia A1c of 8.4, CBG of 291. No change in medication regimen. - Glucose (CBG)  2. Chronic diastolic CHF (congestive heart failure), NYHA class 2 She has no current  evidence of fluid overload, has gained only 1 pound since her last visit 2 weeks ago. Due to pedal edema I am sending off a BNP. - Pro b natriuretic peptide  3. Essential hypertension Blood pressure is controlled. Hydralazine dose raised because I have discontinued amlodipine. - hydrALAZINE (APRESOLINE) 25 MG tablet; Take 1 tablet (25 mg total) by mouth 2 (two) times daily.  Dispense: 60 tablet; Refill: 1  4. Anemia, unspecified anemia type Stable  5. Gastroesophageal reflux disease without esophagitis Continue PPI  6. Influenza vaccine administered - Flu Vaccine QUAD 36+ mos PF IM (Fluarix & Fluzone Quad PF)  7. Gastroparesis due to DM This could explain her symptoms of early satiety and? Dyspnea after eating I have encouraged her to eat small frequent portions  8. Pedal edema Discontinue amlodipine as this is a side effect. She recently completed a course of prednisone which could explain this as well. Advised to increase Lasix to 60 mg twice daily for the next 1 week and to revert back to 40 mg twice daily  after  9. CKD (chronic kidney disease) stage 3, GFR 30-59 ml/min She does have diabetic nephropathy along with microalbuminuria. Advised to abstain from nephrotoxic agents  10. Asthma, mild persistent, uncomplicated Controlled. - albuterol (PROAIR HFA) 108 (90 BASE) MCG/ACT inhaler; Inhale 2 puffs into the lungs every 4 (four) hours as needed for wheezing.  Dispense: 3 Inhaler; Refill: 3   Meds ordered this encounter  Medications  . albuterol (PROAIR HFA) 108 (90 BASE) MCG/ACT inhaler    Sig: Inhale 2 puffs into the lungs every 4 (four) hours as needed for wheezing.    Dispense:  3 Inhaler    Refill:  3  . hydrALAZINE (APRESOLINE) 25 MG tablet    Sig: Take 1 tablet (25 mg total) by mouth 2 (two) times daily.    Dispense:  60 tablet    Refill:  1    Discontinue amlodipine    Follow-up: Return in about 3 weeks (around 04/29/2015) for follow up on DM with PCP.   Arnoldo Morale MD

## 2015-04-08 NOTE — Patient Instructions (Signed)
Diabetes Mellitus and Food It is important for you to manage your blood sugar (glucose) level. Your blood glucose level can be greatly affected by what you eat. Eating healthier foods in the appropriate amounts throughout the day at about the same time each day will help you control your blood glucose level. It can also help slow or prevent worsening of your diabetes mellitus. Healthy eating may even help you improve the level of your blood pressure and reach or maintain a healthy weight.  HOW CAN FOOD AFFECT ME? Carbohydrates Carbohydrates affect your blood glucose level more than any other type of food. Your dietitian will help you determine how many carbohydrates to eat at each meal and teach you how to count carbohydrates. Counting carbohydrates is important to keep your blood glucose at a healthy level, especially if you are using insulin or taking certain medicines for diabetes mellitus. Alcohol Alcohol can cause sudden decreases in blood glucose (hypoglycemia), especially if you use insulin or take certain medicines for diabetes mellitus. Hypoglycemia can be a life-threatening condition. Symptoms of hypoglycemia (sleepiness, dizziness, and disorientation) are similar to symptoms of having too much alcohol.  If your health care provider has given you approval to drink alcohol, do so in moderation and use the following guidelines:  Women should not have more than one drink per day, and men should not have more than two drinks per day. One drink is equal to:  12 oz of beer.  5 oz of wine.  1 oz of hard liquor.  Do not drink on an empty stomach.  Keep yourself hydrated. Have water, diet soda, or unsweetened iced tea.  Regular soda, juice, and other mixers might contain a lot of carbohydrates and should be counted. WHAT FOODS ARE NOT RECOMMENDED? As you make food choices, it is important to remember that all foods are not the same. Some foods have fewer nutrients per serving than other  foods, even though they might have the same number of calories or carbohydrates. It is difficult to get your body what it needs when you eat foods with fewer nutrients. Examples of foods that you should avoid that are high in calories and carbohydrates but low in nutrients include:  Trans fats (most processed foods list trans fats on the Nutrition Facts label).  Regular soda.  Juice.  Candy.  Sweets, such as cake, pie, doughnuts, and cookies.  Fried foods. WHAT FOODS CAN I EAT? Have nutrient-rich foods, which will nourish your body and keep you healthy. The food you should eat also will depend on several factors, including:  The calories you need.  The medicines you take.  Your weight.  Your blood glucose level.  Your blood pressure level.  Your cholesterol level. You also should eat a variety of foods, including:  Protein, such as meat, poultry, fish, tofu, nuts, and seeds (lean animal proteins are best).  Fruits.  Vegetables.  Dairy products, such as milk, cheese, and yogurt (low fat is best).  Breads, grains, pasta, cereal, rice, and beans.  Fats such as olive oil, trans fat-free margarine, canola oil, avocado, and olives. DOES EVERYONE WITH DIABETES MELLITUS HAVE THE SAME MEAL PLAN? Because every person with diabetes mellitus is different, there is not one meal plan that works for everyone. It is very important that you meet with a dietitian who will help you create a meal plan that is just right for you. Document Released: 03/26/2005 Document Revised: 07/04/2013 Document Reviewed: 05/26/2013 Exit[employer] Patient Information 2015 [employer]. This   information is not intended to replace advice given to you by your health care provider. Make sure you discuss any questions you have with your health care provider.  

## 2015-04-09 LAB — PRO B NATRIURETIC PEPTIDE: Pro B Natriuretic peptide (BNP): 143.4 pg/mL — ABNORMAL HIGH (ref <126)

## 2015-04-12 ENCOUNTER — Telehealth: Payer: Self-pay | Admitting: *Deleted

## 2015-04-12 NOTE — Telephone Encounter (Signed)
Spoke to patient and verified name and date of birth. Told patient that labs are normal, negative for fluid overload.  Patient states her legs are still swollen.  Encouraged patient to elevate legs above the level of her heart and explained how to do that.  Patient states she has been and will continue.

## 2015-04-12 NOTE — Telephone Encounter (Signed)
-----   Message from Arnoldo Morale, MD sent at 04/09/2015  9:00 AM EDT ----- Please inform the patient that labs are normal, negative for fluid overload. Thank you.

## 2015-04-15 ENCOUNTER — Telehealth: Payer: Self-pay

## 2015-04-15 NOTE — Telephone Encounter (Signed)
Call placed to the patient to check on her status  She said that she has been out to the stores and walking around [place]. She noted that she still " gets short of breath quickly."  She reported that she is on her feet and does a lot of cooking and needs to take frequent rest breaks. She also noted that she has been driving.   She said that she continues to have pedal edema that decreases when she elevates her feet but returns when she is up on them for awhile. Instructed her to continue to elevate her legs when sitting and she stated that she would.  This CM inquired about her medication compliance. She noted that she is taking the hydralazine as ordered and she is taking the lasix 40mg  BID.  She also said that she stopped the amlodipine as instructed.  This CM informed the patient that Dr Jarold Song would be updated with the patient's complaint of shortness of breath and pedal edema and then this CM would call the patient back with any new instructions. The patient verbalized understanding.   The patient  said that her blood sugars continue to be in the 200's-300's and she just can't get them to " stay down." This morning her blood sugar was 258. Discussed compliance with her diet and she stated that she " could do a little better."  Discussed the possibility of additional nutrition counseling and the patient stated that she would consider it.  The patient also inquired about counseling services.  Explained that the [facility] has a SW and the patient said that she talked to her but did not have her phone #. She said that she ( patient) was driving and could not write down a number. CM offered to have the SW call her and she was agreeable. This CM then spoke to [facility], SW to inform her that the patient would like to talk to her and she ( SW) said that she would call the patient.  The patient stated that she has " a lot going on" and would just like to speak to the SW.     This CM noted that Dr Johna Sheriff office  visit note from 04/08/15 indicates that the patient was instructed to take lasix  60mg  BID x 1 week then return to 40 mg BID.   This CM updated Dr Jarold Song regarding  the patient's current complaints of shortness of breath and pedal edema and informed her that the patient is still taking lasix 40 mg BID.   Dr Jarold Song stated that the patient should now take lasix 60 mg BIDx 1 week only starting today then return to 40 mg BID.   This CM called the patient again and explained that she is to take lasix 60 mg BID x 1 week only then return to taking 40mg  BID . The patient verbalized understanding was able to repeat the order correctly as well as state how she would break the lasix pill in half for the extra 20 mg, so she would take 1.5 pills twice a day for only 1 week.   The patient reported no other questions or new concerns.  CM to contact the patient at the end of the week to check on her status.

## 2015-04-18 ENCOUNTER — Telehealth: Payer: Self-pay

## 2015-04-18 NOTE — Telephone Encounter (Signed)
This Case Manager placed call to patient to check on status, to ensure patient taking Lasix as directed, to discuss need for appointment to follow-up with Dr. Adrian Blackwater, and to discuss need to pick up nebulizer. Unable to reach patient; left voicemail requesting return call.

## 2015-04-22 ENCOUNTER — Telehealth: Payer: Self-pay

## 2015-04-22 NOTE — Telephone Encounter (Signed)
Call placed to the patient to check on her status.  She said that she has been taking the lasix 60mg  BID but has missed a couple of doses in the evening. She noted that she will take lasix 60 mg tonight and then return to 40mg  BID as ordered by Dr Jarold Song.  The patient noted that the edema has improved with the increased lasix but she said that she still has pedal edema when she has been up on her feet walking  a lot during the day. She noted that the edema improves after she is able to lay down and elevate her legs.  Instructed her to call [facility] if she has any additional concerns about the edema and/or her medications. Also notified her that there is a nebulizer in the [facility] office for her to pick up. She reported no difficulty breathing but acknowledged that she would pick it up.  She was also agreeable to scheduling an appointment with Dr Adrian Blackwater and an appointment was scheduled for 04/29/15 @ 0930.   She reported no other problems/ questions at this time.

## 2015-04-23 ENCOUNTER — Telehealth: Payer: Self-pay | Admitting: Clinical

## 2015-04-23 NOTE — Telephone Encounter (Signed)
Returned patient call, patient says she wants to make an appointment to see [facility], was in [facility] today, but says she did not know her nebulizer was ready for pickup. Patient agrees she will see [facility] after 9:30am appointment with Dr. Adrian Blackwater on Monday, 04-29-15

## 2015-04-29 ENCOUNTER — Ambulatory Visit: Payer: Self-pay | Attending: Family Medicine | Admitting: Family Medicine

## 2015-04-29 ENCOUNTER — Encounter: Payer: Self-pay | Admitting: Family Medicine

## 2015-04-29 ENCOUNTER — Ambulatory Visit (HOSPITAL_BASED_OUTPATIENT_CLINIC_OR_DEPARTMENT_OTHER): Payer: Self-pay | Admitting: Clinical

## 2015-04-29 VITALS — BP 138/74 | HR 57 | Temp 98.0°F | Resp 16 | Ht 68.0 in | Wt 275.0 lb

## 2015-04-29 DIAGNOSIS — F329 Major depressive disorder, single episode, unspecified: Secondary | ICD-10-CM

## 2015-04-29 DIAGNOSIS — F418 Other specified anxiety disorders: Secondary | ICD-10-CM

## 2015-04-29 DIAGNOSIS — E1142 Type 2 diabetes mellitus with diabetic polyneuropathy: Secondary | ICD-10-CM

## 2015-04-29 DIAGNOSIS — E1165 Type 2 diabetes mellitus with hyperglycemia: Secondary | ICD-10-CM

## 2015-04-29 DIAGNOSIS — N183 Chronic kidney disease, stage 3 unspecified: Secondary | ICD-10-CM

## 2015-04-29 DIAGNOSIS — E118 Type 2 diabetes mellitus with unspecified complications: Secondary | ICD-10-CM

## 2015-04-29 DIAGNOSIS — F419 Anxiety disorder, unspecified: Secondary | ICD-10-CM

## 2015-04-29 DIAGNOSIS — L299 Pruritus, unspecified: Secondary | ICD-10-CM | POA: Insufficient documentation

## 2015-04-29 DIAGNOSIS — F32A Depression, unspecified: Secondary | ICD-10-CM

## 2015-04-29 LAB — COMPLETE METABOLIC PANEL WITH GFR
ALT: 22 U/L (ref 6–29)
AST: 24 U/L (ref 10–35)
Albumin: 3.7 g/dL (ref 3.6–5.1)
Alkaline Phosphatase: 109 U/L (ref 33–130)
BUN: 44 mg/dL — ABNORMAL HIGH (ref 7–25)
CO2: 27 mmol/L (ref 20–31)
Calcium: 9.2 mg/dL (ref 8.6–10.4)
Chloride: 100 mmol/L (ref 98–110)
Creat: 1.73 mg/dL — ABNORMAL HIGH (ref 0.50–1.05)
GFR, Est African American: 37 mL/min — ABNORMAL LOW (ref >=60)
GFR, Est Non African American: 32 mL/min — ABNORMAL LOW (ref >=60)
Glucose, Bld: 337 mg/dL — ABNORMAL HIGH (ref 65–99)
Potassium: 5 mmol/L (ref 3.5–5.3)
Sodium: 137 mmol/L (ref 135–146)
Total Bilirubin: 0.4 mg/dL (ref 0.2–1.2)
Total Protein: 7.2 g/dL (ref 6.1–8.1)

## 2015-04-29 LAB — POCT URINALYSIS DIPSTICK
Bilirubin, UA: NEGATIVE
Blood, UA: NEGATIVE
Glucose, UA: 100
Ketones, UA: NEGATIVE
Leukocytes, UA: NEGATIVE
Nitrite, UA: NEGATIVE
Protein, UA: >=300
Spec Grav, UA: 1.025
Urobilinogen, UA: 0.2
pH, UA: 5.5

## 2015-04-29 LAB — GLUCOSE, POCT (MANUAL RESULT ENTRY)
POC Glucose: 309 mg/dl — AB (ref 70–99)
POC Glucose: 333 mg/dl — AB (ref 70–99)

## 2015-04-29 LAB — POCT GLYCOSYLATED HEMOGLOBIN (HGB A1C): Hemoglobin A1C: 10.5

## 2015-04-29 MED ORDER — SITAGLIPTIN PHOSPHATE 50 MG PO TABS: 50.0000 mg | ORAL_TABLET | Freq: Every day | ORAL | Status: DC

## 2015-04-29 MED ORDER — INSULIN ASPART PROT & ASPART (70-30 MIX) 100 UNIT/ML PEN: 10.0000 [IU] | PEN_INJECTOR | Freq: Two times a day (BID) | SUBCUTANEOUS | Status: DC

## 2015-04-29 MED ORDER — INSULIN ASPART 100 UNIT/ML ~~LOC~~ SOLN
10.0000 [IU] | Freq: Once | SUBCUTANEOUS | Status: AC
  Administered 2015-04-29: 10 [IU] via SUBCUTANEOUS

## 2015-04-29 MED ORDER — PREGABALIN 50 MG PO CAPS: 50.0000 mg | ORAL_CAPSULE | Freq: Three times a day (TID) | ORAL | Status: DC

## 2015-04-29 MED ORDER — HYDROXYZINE HCL 25 MG PO TABS: 25.0000 mg | ORAL_TABLET | Freq: Three times a day (TID) | ORAL | Status: DC | PRN

## 2015-04-29 NOTE — Progress Notes (Signed)
ASSESSMENT: Pt currently experiencing symptoms of anxiety and depression. Pt needs to f/u with PCP and [facility] and would benefit from continued supportive counseling regarding coping with symptoms of anxiety and depression.  Stage of Change: contemplative  PLAN: 1. F/U with behavioral health consultant in as needed 2. Psychiatric Medications: none 3. Behavioral recommendation(s):   -Do daily relaxation breathing exercises -Consider setting aside daily "worry" time  SUBJECTIVE: Pt. referred by Dr Adrian Blackwater for symptoms of depression and anxiety:  Pt. reports the following symptoms/concerns: Pt states that she is feeling increasingly anxiety and depression; incessant worry is the primary problem. She is unable to stop worrying about her 60yo son having seizures (over a year now), and her 14yo daughter has been released from being incarcerated; the worrying is affecting her concentration and sleep, and she would like to know how to stop the constant worry. Duration of problem: at least one year, increasing over time Severity: moderate  OBJECTIVE: Orientation & Cognition: Oriented x3. Thought processes normal and appropriate to situation. Mood: appropriate. Affect: appropriate Appearance: appropriate Risk of harm to self or others: no risk of harm to self or others Substance use: none Assessments administered: PHQ9:16/ GAD7: 14  Diagnosis: Anxiety and depression CPT Code: F41.8 -------------------------------------------- Other(s) present in the room: none  Time spent with patient in exam room: 30 minutes

## 2015-04-29 NOTE — Progress Notes (Signed)
F/U DM  Glucose running high, between 300-400  Taking medication as prescribed  Non fasting, no insulin today. Complaining of pain on feet  Dry skin, itching worsen at night

## 2015-04-29 NOTE — Assessment & Plan Note (Signed)
A: uncontrolled diabetes in setting of poor diet, lack of exercise and non compliance with insulin regimen. Patient with persistent hyperglycemia  P: D.c novolog Start 70/30 BID 10 U  Continue lantus 50  Add januvia 50 mg daily  Counseled patient on low carb diet Patient to start exercise tomorrow

## 2015-04-29 NOTE — Progress Notes (Signed)
Patient ID: [name], female   DOB: 05-05-1956, 59 y.o.   MRN: [id number]   Subjective:  Patient ID: [name], female    DOB: Dec 10, 1955  Age: 59 y.o. MRN: [id number]  CC: Diabetes   HPI [name] presents for   1. CHRONIC DIABETES  Disease Monitoring  Blood Sugar Ranges: 300-400s for the past 2 months   Polyuria: no   Visual problems: yes   Medication Compliance: no taking 3-4 U of novolog 2-3 times daily, compliant with lantus   Medication Side Effects  Hypoglycemia: yes   Preventitive Health Care  Eye Exam: due   Foot Exam: done recently   Diet pattern: 2 meals per day   Exercise: starting water aerobics tomorrow    2. Foot pain: persistent pain at night. Dorsal of foot. No lesions. Taking gabapentin. Gabapentin is not helping. CBGs are consistently high.   Social History  Substance Use Topics  . Smoking status: Former Smoker    Quit date: 10/25/1980  . Smokeless tobacco: Never Used     Comment: quit smoking in 1982  . Alcohol Use: Yes     Comment: 3 times in last year    Past Medical History  Diagnosis Date  . CHF (congestive heart failure) ([place]) 2011  . Bronchitis   . Diabetes mellitus Dx 1989  . Hypertension Dx 1989  . Ulcer 2010  . Hepatitis C Dx 2013  . Pancreatitis 2013  . Complication of anesthesia   . Refusal of blood transfusions as patient is Jehovah's Witness   . Asthma     As a child    Current Outpatient Prescriptions on File Prior to Visit  Medication Sig Dispense Refill  . albuterol (PROAIR HFA) 108 (90 BASE) MCG/ACT inhaler Inhale 2 puffs into the lungs every 4 (four) hours as needed for wheezing. 3 Inhaler 3  . albuterol (PROVENTIL) (2.5 MG/3ML) 0.083% nebulizer solution Take 3 mLs (2.5 mg total) by nebulization every 6 (six) hours as needed for wheezing or shortness of breath. 150 mL 1  . amLODipine (NORVASC) 10 MG tablet Take 1 tablet (10 mg total) by mouth daily. 30 tablet 3  . aspirin 325 MG tablet Take 1 tablet (325 mg  total) by mouth daily. 30 tablet 3  . atorvastatin (LIPITOR) 20 MG tablet Take 1 tablet (20 mg total) by mouth daily. 30 tablet 3  . Blood Glucose Monitoring Suppl (TRUE METRIX METER) W/DEVICE KIT 1 each by Does not apply route as needed. (Patient taking differently: 1 each by Other route See admin instructions. Check blood sugar 3 times daily) 1 kit 0  . carvedilol (COREG) 25 MG tablet Take 1 tablet (25 mg total) by mouth 2 (two) times daily with a meal. 60 tablet 6  . Fluticasone-Salmeterol (ADVAIR DISKUS) 250-50 MCG/DOSE AEPB Inhale 1 puff into the lungs 2 (two) times daily. 60 each 1  . furosemide (LASIX) 40 MG tablet Take 1 tablet (40 mg total) by mouth 2 (two) times daily. 8 AM and 2 PM 60 tablet 2  . gabapentin (NEURONTIN) 300 MG capsule Take 1-2 capsules (300-600 mg total) by mouth at bedtime. 60 capsule 2  . glucose blood (TRUE METRIX BLOOD GLUCOSE TEST) test strip 1 each by Other route 3 (three) times daily. 100 each 11  . hydrALAZINE (APRESOLINE) 25 MG tablet Take 1 tablet (25 mg total) by mouth 2 (two) times daily. 60 tablet 1  . insulin aspart (NOVOLOG) 100 UNIT/ML injection Inject 12 Units into  the skin 3 (three) times daily before meals. 10 mL 3  . Insulin Glargine (LANTUS SOLOSTAR) 100 UNIT/ML Solostar Pen Inject 50 Units into the skin daily at 10 pm. 5 pen 6  . ketotifen (ZADITOR) 0.025 % ophthalmic solution Place 1 drop into both eyes 2 (two) times daily. 10 mL 1  . lisinopril (PRINIVIL,ZESTRIL) 40 MG tablet Take 1 tablet (40 mg total) by mouth daily. 30 tablet 3  . Potassium Chloride ER 20 MEQ TBCR Take 20 mEq by mouth daily. With lasix 60 tablet 1  . ranitidine (ZANTAC) 150 MG tablet Take 1 tablet (150 mg total) by mouth 2 (two) times daily. 60 tablet 2  . valACYclovir (VALTREX) 500 MG tablet Take 1 tablet (500 mg total) by mouth daily. 30 tablet 2   Current Facility-Administered Medications on File Prior to Visit  Medication Dose Route Frequency Provider Last Rate Last Dose  .  insulin aspart (novoLOG) injection 10 Units  10 Units Subcutaneous Once Arnoldo Morale, MD       ROS Review of Systems  Constitutional: Negative for fever and chills.  Eyes: Positive for visual disturbance.  Respiratory: Negative for shortness of breath.   Cardiovascular: Positive for leg swelling (in feet improved since norvasc D/Cd). Negative for chest pain.  Gastrointestinal: Negative for abdominal pain and blood in stool.  Musculoskeletal: Negative for back pain and arthralgias.  Skin: Negative for rash.  Allergic/Immunologic: Negative for immunocompromised state.  Neurological: Positive for numbness (pains and numbness in feet ).  Hematological: Negative for adenopathy. Does not bruise/bleed easily.  Psychiatric/Behavioral: Negative for suicidal ideas and dysphoric mood.  GAD-7: score of 14. 1-5, 2-3,6. 3-2,3,7.   Objective:   Today's Vitals: BP 138/74 mmHg  Pulse 57  Temp(Src) 98 F (36.7 C)  Resp 16  Ht 5' 8"  (1.727 m)  Wt 275 lb (124.739 kg)  BMI 41.82 kg/m2  SpO2 97% Wt Readings from Last 3 Encounters:  04/29/15 275 lb (124.739 kg)  04/08/15 284 lb 9.6 oz (129.094 kg)  03/25/15 283 lb (128.368 kg)   Physical Exam  Constitutional: She is oriented to person, place, and time. She appears well-developed and well-nourished. No distress.  Obese   HENT:  Head: Normocephalic and atraumatic.  Cardiovascular: Normal rate, regular rhythm, normal heart sounds and intact distal pulses.   Pulmonary/Chest: Effort normal and breath sounds normal.  Musculoskeletal: She exhibits no edema.  Neurological: She is alert and oriented to person, place, and time.  Skin: Skin is warm and dry. No rash noted.  Psychiatric: She has a normal mood and affect.     Lab Results  Component Value Date   HGBA1C 10.50 04/29/2015   Last CBG 8.2 on 01/24/2015   CBG 309  Treated with 10 U of novolog  Repeat CBG 333 Assessment & Plan:   Diabetes mellitus type 2, uncontrolled, with  complications A: uncontrolled diabetes in setting of poor diet, lack of exercise and non compliance with insulin regimen. Patient with persistent hyperglycemia  P: D.c novolog Start 70/30 BID 10 U  Continue lantus 50  Add januvia 50 mg daily  Counseled patient on low carb diet Patient to start exercise tomorrow   Diabetic neuropathy A: persistent. No improvement with gabapentin P: D.c gabapentin Start lyrica, patient to apply for PASS     Outpatient Encounter Prescriptions as of 04/29/2015  Medication Sig  . albuterol (PROAIR HFA) 108 (90 BASE) MCG/ACT inhaler Inhale 2 puffs into the lungs every 4 (four) hours as needed for wheezing.  [name]  albuterol (PROVENTIL) (2.5 MG/3ML) 0.083% nebulizer solution Take 3 mLs (2.5 mg total) by nebulization every 6 (six) hours as needed for wheezing or shortness of breath.  [name] amLODipine (NORVASC) 10 MG tablet Take 1 tablet (10 mg total) by mouth daily.  [name] aspirin 325 MG tablet Take 1 tablet (325 mg total) by mouth daily.  [name] atorvastatin (LIPITOR) 20 MG tablet Take 1 tablet (20 mg total) by mouth daily.  . Blood Glucose Monitoring Suppl (TRUE METRIX METER) W/DEVICE KIT 1 each by Does not apply route as needed. (Patient taking differently: 1 each by Other route See admin instructions. Check blood sugar 3 times daily)  . carvedilol (COREG) 25 MG tablet Take 1 tablet (25 mg total) by mouth 2 (two) times daily with a meal.  . Fluticasone-Salmeterol (ADVAIR DISKUS) 250-50 MCG/DOSE AEPB Inhale 1 puff into the lungs 2 (two) times daily.  . furosemide (LASIX) 40 MG tablet Take 1 tablet (40 mg total) by mouth 2 (two) times daily. 8 AM and 2 PM  . gabapentin (NEURONTIN) 300 MG capsule Take 1-2 capsules (300-600 mg total) by mouth at bedtime.  [name] glucose blood (TRUE METRIX BLOOD GLUCOSE TEST) test strip 1 each by Other route 3 (three) times daily.  . hydrALAZINE (APRESOLINE) 25 MG tablet Take 1 tablet (25 mg total) by mouth 2 (two) times daily.  . Insulin Glargine  (LANTUS SOLOSTAR) 100 UNIT/ML Solostar Pen Inject 50 Units into the skin daily at 10 pm.  . ketotifen (ZADITOR) 0.025 % ophthalmic solution Place 1 drop into both eyes 2 (two) times daily.  [name] lisinopril (PRINIVIL,ZESTRIL) 40 MG tablet Take 1 tablet (40 mg total) by mouth daily.  . Potassium Chloride ER 20 MEQ TBCR Take 20 mEq by mouth daily. With lasix  . ranitidine (ZANTAC) 150 MG tablet Take 1 tablet (150 mg total) by mouth 2 (two) times daily.  . valACYclovir (VALTREX) 500 MG tablet Take 1 tablet (500 mg total) by mouth daily.  . [DISCONTINUED] insulin aspart (NOVOLOG) 100 UNIT/ML injection Inject 12 Units into the skin 3 (three) times daily before meals.  . insulin aspart protamine - aspart (NOVOLOG MIX 70/30 FLEXPEN) (70-30) 100 UNIT/ML FlexPen Inject 0.1 mLs (10 Units total) into the skin 2 (two) times daily with a meal.  . pregabalin (LYRICA) 50 MG capsule Take 1 capsule (50 mg total) by mouth 3 (three) times daily.  . sitaGLIPtin (JANUVIA) 50 MG tablet Take 1 tablet (50 mg total) by mouth daily.  . [EXPIRED] insulin aspart (novoLOG) injection 10 Units   . [DISCONTINUED] insulin aspart (novoLOG) injection 10 Units    No facility-administered encounter medications on file as of 04/29/2015.    Follow-up: No Follow-up on file.    Boykin Nearing MD

## 2015-04-29 NOTE — Patient Instructions (Addendum)
[place] was seen today for diabetes.  Diagnoses and all orders for this visit:  Uncontrolled type 2 diabetes mellitus with complication, unspecified long term insulin use status (HCC) -     POCT glucose (manual entry) -     POCT glycosylated hemoglobin (Hb A1C) -     insulin aspart (novoLOG) injection 10 Units; Inject 0.1 mLs (10 Units total) into the skin once. -     POCT urinalysis dipstick -     sitaGLIPtin (JANUVIA) 50 MG tablet; Take 1 tablet (50 mg total) by mouth daily. -     insulin aspart protamine - aspart (NOVOLOG MIX 70/30 FLEXPEN) (70-30) 100 UNIT/ML FlexPen; Inject 0.1 mLs (10 Units total) into the skin 2 (two) times daily with a meal.  Diabetic polyneuropathy associated with type 2 diabetes mellitus (HCC) -     pregabalin (LYRICA) 50 MG capsule; Take 1 capsule (50 mg total) by mouth 3 (three) times daily.    Stop novolog at home Start 70/30 insulin twice daily Low carb eating- proteins and fats for breakfast like eggs, avocado, and low salt breakfast meat  Diabetes blood sugar goals  Fasting (in AM before breakfast, 8 hrs of no eating or drinking (except water or unsweetened coffee or tea): 90-110 2 hrs after meals: < 160,   No low sugars: nothing < 70   Please apply for [place] discount and orange card, you can also inquire if any of your medications are on the PASS (medications assistance) list.   F/u in 4 weeks with pharmacy team for CBG check F/u with me in 8 weeks for diabetes   Dr. Adrian Blackwater

## 2015-04-29 NOTE — Assessment & Plan Note (Signed)
A: persistent. No improvement with gabapentin P: D.c gabapentin Start lyrica, patient to apply for PASS

## 2015-04-29 NOTE — Addendum Note (Signed)
Addended by: Boykin Nearing on: 04/29/2015 02:15 PM   Modules accepted: Orders

## 2015-04-30 NOTE — Assessment & Plan Note (Signed)
Elevated blood sugar and Cr on CMP Continue current care plan Cr is a indicator of kidney function, uncontrolled diabetes leads to kidney failure Control diabetes with diet, insulin and januvia If Cr rises above 2 will need to stop lisinopril

## 2015-05-08 ENCOUNTER — Telehealth: Payer: Self-pay | Admitting: *Deleted

## 2015-05-08 NOTE — Telephone Encounter (Signed)
LVM to return call.

## 2015-05-08 NOTE — Telephone Encounter (Signed)
-----   Message from Boykin Nearing, MD sent at 04/30/2015  1:40 PM EDT ----- Elevated blood sugar and Cr on CMP Continue current care plan Cr is a indicator of kidney function, uncontrolled diabetes leads to kidney failure Control diabetes with diet, insulin and januvia If Cr rises above 2 will need to stop lisinopril

## 2015-05-17 ENCOUNTER — Other Ambulatory Visit: Payer: Self-pay | Admitting: Family Medicine

## 2015-05-17 DIAGNOSIS — I10 Essential (primary) hypertension: Secondary | ICD-10-CM

## 2015-05-17 MED ORDER — HYDRALAZINE HCL 25 MG PO TABS
25.0000 mg | ORAL_TABLET | Freq: Two times a day (BID) | ORAL | Status: DC
Start: 2015-05-17 — End: 2015-11-25

## 2015-05-17 NOTE — Addendum Note (Signed)
Addended by: Boykin Nearing on: 05/17/2015 01:49 PM   Modules accepted: Orders

## 2015-05-17 NOTE — Telephone Encounter (Signed)
I refilled hydralazine.

## 2015-05-20 ENCOUNTER — Other Ambulatory Visit: Payer: Self-pay | Admitting: Pharmacist

## 2015-05-20 DIAGNOSIS — E1100 Type 2 diabetes mellitus with hyperosmolarity without nonketotic hyperglycemic-hyperosmolar coma (NKHHC): Secondary | ICD-10-CM

## 2015-05-20 DIAGNOSIS — Z794 Long term (current) use of insulin: Secondary | ICD-10-CM

## 2015-05-20 DIAGNOSIS — E1165 Type 2 diabetes mellitus with hyperglycemia: Secondary | ICD-10-CM

## 2015-05-20 DIAGNOSIS — E118 Type 2 diabetes mellitus with unspecified complications: Secondary | ICD-10-CM

## 2015-05-20 DIAGNOSIS — IMO0002 Reserved for concepts with insufficient information to code with codable children: Secondary | ICD-10-CM

## 2015-05-20 MED ORDER — TRUE METRIX METER W/DEVICE KIT: 1.0000 | PACK | Status: DC | PRN

## 2015-05-20 MED ORDER — GLUCOSE BLOOD VI STRP: 1.0000 | ORAL_STRIP | Freq: Three times a day (TID) | Status: DC

## 2015-05-28 ENCOUNTER — Encounter: Payer: Medicaid Other | Admitting: Pharmacist

## 2015-06-04 ENCOUNTER — Other Ambulatory Visit: Payer: Self-pay | Admitting: Family Medicine

## 2015-06-04 DIAGNOSIS — E1165 Type 2 diabetes mellitus with hyperglycemia: Secondary | ICD-10-CM

## 2015-06-04 DIAGNOSIS — E118 Type 2 diabetes mellitus with unspecified complications: Principal | ICD-10-CM

## 2015-06-04 MED ORDER — SITAGLIPTIN PHOSPHATE 25 MG PO TABS: 50.0000 mg | ORAL_TABLET | Freq: Every day | ORAL | Status: DC

## 2015-06-27 ENCOUNTER — Ambulatory Visit: Payer: Self-pay | Attending: Family Medicine | Admitting: Family Medicine

## 2015-06-27 ENCOUNTER — Encounter: Payer: Self-pay | Admitting: Family Medicine

## 2015-06-27 VITALS — BP 167/80 | HR 55 | Temp 98.4°F | Resp 16 | Ht 68.0 in | Wt 276.0 lb

## 2015-06-27 DIAGNOSIS — E1142 Type 2 diabetes mellitus with diabetic polyneuropathy: Secondary | ICD-10-CM | POA: Insufficient documentation

## 2015-06-27 DIAGNOSIS — N183 Chronic kidney disease, stage 3 unspecified: Secondary | ICD-10-CM

## 2015-06-27 DIAGNOSIS — I10 Essential (primary) hypertension: Secondary | ICD-10-CM | POA: Insufficient documentation

## 2015-06-27 DIAGNOSIS — Z87891 Personal history of nicotine dependence: Secondary | ICD-10-CM | POA: Insufficient documentation

## 2015-06-27 DIAGNOSIS — Z79899 Other long term (current) drug therapy: Secondary | ICD-10-CM | POA: Insufficient documentation

## 2015-06-27 DIAGNOSIS — E1165 Type 2 diabetes mellitus with hyperglycemia: Secondary | ICD-10-CM | POA: Insufficient documentation

## 2015-06-27 DIAGNOSIS — A6000 Herpesviral infection of urogenital system, unspecified: Secondary | ICD-10-CM | POA: Insufficient documentation

## 2015-06-27 DIAGNOSIS — B353 Tinea pedis: Secondary | ICD-10-CM | POA: Insufficient documentation

## 2015-06-27 DIAGNOSIS — B1920 Unspecified viral hepatitis C without hepatic coma: Secondary | ICD-10-CM | POA: Insufficient documentation

## 2015-06-27 DIAGNOSIS — I129 Hypertensive chronic kidney disease with stage 1 through stage 4 chronic kidney disease, or unspecified chronic kidney disease: Secondary | ICD-10-CM | POA: Insufficient documentation

## 2015-06-27 DIAGNOSIS — Z794 Long term (current) use of insulin: Secondary | ICD-10-CM | POA: Insufficient documentation

## 2015-06-27 DIAGNOSIS — M25531 Pain in right wrist: Secondary | ICD-10-CM | POA: Insufficient documentation

## 2015-06-27 DIAGNOSIS — I5033 Acute on chronic diastolic (congestive) heart failure: Secondary | ICD-10-CM | POA: Insufficient documentation

## 2015-06-27 DIAGNOSIS — Z0000 Encounter for general adult medical examination without abnormal findings: Secondary | ICD-10-CM | POA: Insufficient documentation

## 2015-06-27 DIAGNOSIS — Z7982 Long term (current) use of aspirin: Secondary | ICD-10-CM | POA: Insufficient documentation

## 2015-06-27 DIAGNOSIS — E118 Type 2 diabetes mellitus with unspecified complications: Secondary | ICD-10-CM | POA: Insufficient documentation

## 2015-06-27 DIAGNOSIS — M79641 Pain in right hand: Secondary | ICD-10-CM | POA: Insufficient documentation

## 2015-06-27 LAB — COMPLETE METABOLIC PANEL WITH GFR
ALT: 29 U/L (ref 6–29)
AST: 35 U/L (ref 10–35)
Albumin: 3.4 g/dL — ABNORMAL LOW (ref 3.6–5.1)
Alkaline Phosphatase: 98 U/L (ref 33–130)
BUN: 37 mg/dL — ABNORMAL HIGH (ref 7–25)
CO2: 26 mmol/L (ref 20–31)
Calcium: 8.9 mg/dL (ref 8.6–10.4)
Chloride: 106 mmol/L (ref 98–110)
Creat: 1.39 mg/dL — ABNORMAL HIGH (ref 0.50–1.05)
GFR, Est African American: 49 mL/min — ABNORMAL LOW (ref >=60)
GFR, Est Non African American: 42 mL/min — ABNORMAL LOW (ref >=60)
Glucose, Bld: 97 mg/dL (ref 65–99)
Potassium: 4.5 mmol/L (ref 3.5–5.3)
Sodium: 142 mmol/L (ref 135–146)
Total Bilirubin: 0.4 mg/dL (ref 0.2–1.2)
Total Protein: 6.5 g/dL (ref 6.1–8.1)

## 2015-06-27 LAB — GLUCOSE, POCT (MANUAL RESULT ENTRY): POC Glucose: 122 mg/dl — AB (ref 70–99)

## 2015-06-27 MED ORDER — VALACYCLOVIR HCL 500 MG PO TABS: 500.0000 mg | ORAL_TABLET | Freq: Every day | ORAL | Status: DC

## 2015-06-27 MED ORDER — GABAPENTIN 300 MG PO CAPS: 600.0000 mg | ORAL_CAPSULE | Freq: Two times a day (BID) | ORAL | Status: DC

## 2015-06-27 MED ORDER — DICLOFENAC SODIUM 1 % TD GEL: 4.0000 g | Freq: Four times a day (QID) | TRANSDERMAL | Status: DC

## 2015-06-27 MED ORDER — CARVEDILOL 25 MG PO TABS: 25.0000 mg | ORAL_TABLET | Freq: Two times a day (BID) | ORAL | Status: DC

## 2015-06-27 MED ORDER — ASPIRIN 325 MG PO TABS: 325.0000 mg | ORAL_TABLET | Freq: Every day | ORAL | Status: DC

## 2015-06-27 MED ORDER — SITAGLIPTIN PHOSPHATE 50 MG PO TABS: 50.0000 mg | ORAL_TABLET | Freq: Every day | ORAL | Status: DC

## 2015-06-27 MED ORDER — POTASSIUM CHLORIDE ER 20 MEQ PO TBCR: 20.0000 meq | EXTENDED_RELEASE_TABLET | Freq: Two times a day (BID) | ORAL | Status: DC

## 2015-06-27 MED ORDER — KETOCONAZOLE 2 % EX CREA: 1.0000 "application " | TOPICAL_CREAM | Freq: Every day | CUTANEOUS | Status: DC

## 2015-06-27 MED ORDER — FUROSEMIDE 40 MG PO TABS: 40.0000 mg | ORAL_TABLET | Freq: Two times a day (BID) | ORAL | Status: DC

## 2015-06-27 NOTE — Patient Instructions (Addendum)
[place] was seen today for diabetes.  Diagnoses and all orders for this visit:  Uncontrolled type 2 diabetes mellitus with complication, unspecified long term insulin use status (HCC) -     POCT glucose (manual entry) -     sitaGLIPtin (JANUVIA) 50 MG tablet; Take 1 tablet (50 mg total) by mouth daily. -     COMPLETE METABOLIC PANEL WITH GFR  Genital herpes -     valACYclovir (VALTREX) 500 MG tablet; Take 1 tablet (500 mg total) by mouth daily.  Acute on chronic diastolic congestive heart failure, NYHA class 2 (HCC) -     aspirin 325 MG tablet; Take 1 tablet (325 mg total) by mouth daily. -     furosemide (LASIX) 40 MG tablet; Take 1 tablet (40 mg total) by mouth 2 (two) times daily. 8 AM and 2 PM -     carvedilol (COREG) 25 MG tablet; Take 1 tablet (25 mg total) by mouth 2 (two) times daily with a meal. -     Potassium Chloride ER 20 MEQ TBCR; Take 20 mEq by mouth 2 (two) times daily. With lasix  Uncontrolled hypertension -     COMPLETE METABOLIC PANEL WITH GFR  CKD (chronic kidney disease) stage 3, GFR 30-59 ml/min -     COMPLETE METABOLIC PANEL WITH GFR  Diabetic polyneuropathy associated with type 2 diabetes mellitus (HCC) -     gabapentin (NEURONTIN) 300 MG capsule; Take 2 capsules (600 mg total) by mouth 2 (two) times daily.    I refilled all meds Lyrica and januvia application is in process. We should hear by the end of the month. For hep C treatment, the [place] discount helps cover the cost. Medicaid is preferred.  Please apply for medicaid and disability  Please apply for [place] discount and orange card, you can also inquire if any of your medications are on the PASS (medications assistance) list.   F/u in 3 weeks for pharmacy BP check  F/u with me in 6 weeks for A1c   Dr. Adrian Blackwater

## 2015-06-27 NOTE — Progress Notes (Signed)
Subjective:  Patient ID: [name], female    DOB: 03-24-1956  Age: 59 y.o. MRN: [id number]  CC: Diabetes   HPI [name] presents for   1. CHRONIC DIABETES  Disease Monitoring  Blood Sugar Ranges: 122-160   Polyuria: no   Visual problems: no   Medication Compliance: yes  Medication Side Effects  Hypoglycemia: 60, twice in last 3-4 months when working out   [place] Exam: not yet   Foot Exam: done today      2. HTN: taking all meds. Most recent lasix dose was changed to once daily. Having worsening swelling. No CP or SOB.   3. Hep C: tried to seek treatment. Referrals placed by me twice. She was told that she has to have medicaid for treatment.   4. R hand pain: worsening. Better when she makes a fist. Intermittent swelling. Still awaiting lyrica. Has not tried a brace. Gabapentin and oral NSAID has not helped. Pain is associated with numbness as well.   Social History  Substance Use Topics  . Smoking status: Former Smoker    Quit date: 10/25/1980  . Smokeless tobacco: Never Used     Comment: quit smoking in 1982  . Alcohol Use: Yes     Comment: 3 times in last year    Outpatient Prescriptions Prior to Visit  Medication Sig Dispense Refill  . albuterol (PROAIR HFA) 108 (90 BASE) MCG/ACT inhaler Inhale 2 puffs into the lungs every 4 (four) hours as needed for wheezing. 3 Inhaler 3  . albuterol (PROVENTIL) (2.5 MG/3ML) 0.083% nebulizer solution Take 3 mLs (2.5 mg total) by nebulization every 6 (six) hours as needed for wheezing or shortness of breath. 150 mL 1  . aspirin 325 MG tablet Take 1 tablet (325 mg total) by mouth daily. 30 tablet 3  . atorvastatin (LIPITOR) 20 MG tablet Take 1 tablet (20 mg total) by mouth daily. 30 tablet 3  . Blood Glucose Monitoring Suppl (TRUE METRIX METER) W/DEVICE KIT 1 each by Does not apply route as needed. 1 kit 0  . carvedilol (COREG) 25 MG tablet Take 1 tablet (25 mg total) by mouth 2 (two) times daily  with a meal. 60 tablet 6  . Fluticasone-Salmeterol (ADVAIR DISKUS) 250-50 MCG/DOSE AEPB Inhale 1 puff into the lungs 2 (two) times daily. 60 each 1  . furosemide (LASIX) 40 MG tablet Take 1 tablet (40 mg total) by mouth 2 (two) times daily. 8 AM and 2 PM 60 tablet 2  . furosemide (LASIX) 40 MG tablet TAKE 1 TABLET BY MOUTH DAILY. 30 tablet 1  . glucose blood (TRUE METRIX BLOOD GLUCOSE TEST) test strip 1 each by Other route 3 (three) times daily. 100 each 11  . hydrALAZINE (APRESOLINE) 25 MG tablet Take 1 tablet (25 mg total) by mouth 2 (two) times daily. 60 tablet 5  . hydrOXYzine (ATARAX/VISTARIL) 25 MG tablet Take 1 tablet (25 mg total) by mouth 3 (three) times daily as needed for itching. 30 tablet 2  . insulin aspart protamine - aspart (NOVOLOG MIX 70/30 FLEXPEN) (70-30) 100 UNIT/ML FlexPen Inject 0.1 mLs (10 Units total) into the skin 2 (two) times daily with a meal. 15 mL 5  . Insulin Glargine (LANTUS SOLOSTAR) 100 UNIT/ML Solostar Pen Inject 50 Units into the skin daily at 10 pm. 5 pen 6  . ketotifen (ZADITOR) 0.025 % ophthalmic solution Place 1 drop into both eyes 2 (two) times daily. 10 mL 1  .  lisinopril (PRINIVIL,ZESTRIL) 40 MG tablet Take 1 tablet (40 mg total) by mouth daily. 30 tablet 3  . lisinopril (PRINIVIL,ZESTRIL) 40 MG tablet TAKE 1 TABLET BY MOUTH DAILY 30 tablet 3  . Potassium Chloride ER 20 MEQ TBCR Take 20 mEq by mouth daily. With lasix 60 tablet 1  . pregabalin (LYRICA) 50 MG capsule Take 1 capsule (50 mg total) by mouth 3 (three) times daily. 90 capsule 1  . ranitidine (ZANTAC) 150 MG tablet TAKE 1 TABLET (150 MG TOTAL) BY MOUTH 2 (TWO) TIMES DAILY. 60 tablet 2  . sitaGLIPtin (JANUVIA) 25 MG tablet Take 2 tablets (50 mg total) by mouth daily. 60 tablet 0  . sitaGLIPtin (JANUVIA) 50 MG tablet Take 1 tablet (50 mg total) by mouth daily. 30 tablet 5  . valACYclovir (VALTREX) 500 MG tablet Take 1 tablet (500 mg total) by mouth daily. 30 tablet 2   No facility-administered  medications prior to visit.    ROS Review of Systems  Constitutional: Negative for fever and chills.  Eyes: Negative for visual disturbance.  Respiratory: Negative for shortness of breath.   Cardiovascular: Positive for leg swelling (worsened when lasix changed to once daily ). Negative for chest pain.  Gastrointestinal: Negative for abdominal pain and blood in stool.  Musculoskeletal: Positive for joint swelling (in R hand ) and arthralgias (in R hand ). Negative for back pain.  Skin: Negative for rash.  Allergic/Immunologic: Negative for immunocompromised state.  Neurological: Positive for numbness (pains and numbness in feet ).  Hematological: Negative for adenopathy. Does not bruise/bleed easily.  Psychiatric/Behavioral: Negative for suicidal ideas and dysphoric mood.    Objective:  BP 167/80 mmHg  Pulse 55  Temp(Src) 98.4 F (36.9 C) (Oral)  Resp 16  Ht '5\' 8"'$  (1.727 m)  Wt 276 lb (125.193 kg)  BMI 41.98 kg/m2  SpO2 97%  BP/Weight 06/27/2015 04/29/2015 01/07/3150  Systolic BP 761 607 371  Diastolic BP 80 74 82  Wt. (Lbs) 276 275 284.6  BMI 41.98 41.82 43.28    Physical Exam  Constitutional: She is oriented to person, place, and time. She appears well-developed and well-nourished. No distress.  Obese   HENT:  Head: Normocephalic and atraumatic.  Cardiovascular: Normal rate, regular rhythm, normal heart sounds and intact distal pulses.   Pulmonary/Chest: Effort normal and breath sounds normal.  Musculoskeletal: She exhibits no edema.  Neurological: She is alert and oriented to person, place, and time.  Skin: Skin is warm and dry. No rash noted.  Psychiatric: She has a normal mood and affect.   Lab Results  Component Value Date   HGBA1C 10.50 04/29/2015   CBG 122  Assessment & Plan:   Problem List Items Addressed This Visit    CKD (chronic kidney disease) stage 3, GFR 30-59 ml/min (Chronic)   Relevant Orders   COMPLETE METABOLIC PANEL WITH GFR   Diabetes  mellitus type 2, uncontrolled, with complications (HCC) - Primary (Chronic)   Relevant Medications   aspirin 325 MG tablet   sitaGLIPtin (JANUVIA) 50 MG tablet   Other Relevant Orders   POCT glucose (manual entry) (Completed)   COMPLETE METABOLIC PANEL WITH GFR   Diabetic neuropathy (HCC) (Chronic)   Relevant Medications   aspirin 325 MG tablet   sitaGLIPtin (JANUVIA) 50 MG tablet   gabapentin (NEURONTIN) 300 MG capsule   Tinea pedis   Relevant Medications   valACYclovir (VALTREX) 500 MG tablet   ketoconazole (NIZORAL) 2 % cream   Uncontrolled hypertension (Chronic)   Relevant  Medications   aspirin 325 MG tablet   furosemide (LASIX) 40 MG tablet   carvedilol (COREG) 25 MG tablet   Other Relevant Orders   COMPLETE METABOLIC PANEL WITH GFR    Other Visit Diagnoses    Genital herpes        Relevant Medications    valACYclovir (VALTREX) 500 MG tablet    ketoconazole (NIZORAL) 2 % cream    Acute on chronic diastolic congestive heart failure, NYHA class 2 (HCC)  (Chronic)       Relevant Medications    aspirin 325 MG tablet    furosemide (LASIX) 40 MG tablet    carvedilol (COREG) 25 MG tablet    Potassium Chloride ER 20 MEQ TBCR    Right wrist pain        Relevant Medications    diclofenac sodium (VOLTAREN) 1 % GEL    Healthcare maintenance        Relevant Orders    MM DIGITAL SCREENING BILATERAL       No orders of the defined types were placed in this encounter.    Follow-up: No Follow-up on file.   Boykin Nearing MD

## 2015-06-27 NOTE — Progress Notes (Signed)
F/U DM  Pt complaining of pain on rt hand, no hx injury  Pain scale #8 No suicidal thought in the past two weeks

## 2015-07-01 ENCOUNTER — Telehealth: Payer: Self-pay | Admitting: *Deleted

## 2015-07-01 DIAGNOSIS — E1142 Type 2 diabetes mellitus with diabetic polyneuropathy: Secondary | ICD-10-CM

## 2015-07-01 NOTE — Telephone Encounter (Signed)
-----   Message from Boykin Nearing, MD sent at 06/28/2015  9:20 AM EST ----- Creatinine stable at 1.4 GFR 49 Continue current regimen Continue close f/u and monitor of blood sugar and blood pressure Keep control of both sugar and pressure to prevent damage to kidneys Remember avoid all oral NSAIDs- ibuprofen, aleve, goody's, BCs  In setting of dehydration (due to vomiting or diarrhea) Do not take lasix or lisinopril and call immediately

## 2015-07-01 NOTE — Telephone Encounter (Signed)
Date of birth verified by pt  Stated no vomiting and diarrhea Lab results given, Creatinine stable GFR. Continue monitor BP and DM Avoid NSAIDS  Pt verbalized understanding

## 2015-07-02 ENCOUNTER — Other Ambulatory Visit: Payer: Self-pay | Admitting: *Deleted

## 2015-07-02 DIAGNOSIS — E1165 Type 2 diabetes mellitus with hyperglycemia: Secondary | ICD-10-CM

## 2015-07-02 DIAGNOSIS — E118 Type 2 diabetes mellitus with unspecified complications: Principal | ICD-10-CM

## 2015-07-02 MED ORDER — SITAGLIPTIN PHOSPHATE 50 MG PO TABS: 50.0000 mg | ORAL_TABLET | Freq: Every day | ORAL | Status: DC

## 2015-07-03 MED ORDER — TRAMADOL HCL 50 MG PO TABS: 50.0000 mg | ORAL_TABLET | Freq: Three times a day (TID) | ORAL | Status: DC | PRN

## 2015-07-03 NOTE — Telephone Encounter (Signed)
Please in form patient that a response regarding lyrica should be coming soon The most important thing is tight blood sugar control with low carb diet, exercise and med compliance  I have ordered tramadol that she can try if the numbness is painful. She may need to take benadryl before tramadol to prevent risk of itching.   Rx ready for pick up

## 2015-07-03 NOTE — Telephone Encounter (Signed)
LVM Rx at front office ready to be pickup 

## 2015-07-03 NOTE — Telephone Encounter (Signed)
Left

## 2015-07-03 NOTE — Addendum Note (Signed)
Addended by: Boykin Nearing on: 07/03/2015 08:32 AM   Modules accepted: Orders

## 2015-07-04 ENCOUNTER — Telehealth: Payer: Self-pay | Admitting: Family Medicine

## 2015-07-04 NOTE — Telephone Encounter (Signed)
Pt. Called requesting to speak to nurse regarding her hand. Pt. Stated that her R hand gets numb, and it is painful. Pt. Has to keep a glove on and keep it in the heat because it feels cold. Please f/u with pt.

## 2015-07-11 ENCOUNTER — Other Ambulatory Visit: Payer: Self-pay | Admitting: Family Medicine

## 2015-07-14 DIAGNOSIS — I219 Acute myocardial infarction, unspecified: Secondary | ICD-10-CM

## 2015-07-14 HISTORY — DX: Acute myocardial infarction, unspecified: I21.9

## 2015-07-14 HISTORY — PX: CORONARY ANGIOPLASTY: SHX604

## 2015-07-29 MED FILL — LISINOPRIL 40 MG TABLET: 40 | 30 days supply | Qty: 30 | Fill #1

## 2015-07-29 MED FILL — VENTOLIN HFA 90 MCG INHALER: 108 (90 BAS | 10 days supply | Qty: 18 | Fill #1

## 2015-07-29 MED FILL — raNITIdine HCL 150 MG TABS: 150 | 30 days supply | Qty: 60 | Fill #1

## 2015-07-30 ENCOUNTER — Telehealth: Payer: Self-pay | Admitting: Family Medicine

## 2015-07-30 DIAGNOSIS — R200 Anesthesia of skin: Secondary | ICD-10-CM

## 2015-07-30 MED FILL — ?VALACYCLOVIR HCL 500MG TAB: 500 | 30 days supply | Qty: 30 | Fill #2

## 2015-07-30 MED FILL — ?CARVEDILOL 25 MG TABLET: 25 | 30 days supply | Qty: 60 | Fill #1

## 2015-07-30 NOTE — Telephone Encounter (Signed)
Pt. States that now all fingers on both hands are numbed..in her prior appt. She informed pcp that one hand was numbed... Please advised patient

## 2015-07-31 MED FILL — FUROSEMIDE 40 MG TABLET: 40 | 30 days supply | Qty: 60 | Fill #0

## 2015-08-01 DIAGNOSIS — R200 Anesthesia of skin: Secondary | ICD-10-CM | POA: Insufficient documentation

## 2015-08-01 NOTE — Telephone Encounter (Signed)
Patient was informed that a referral has been placed for neurology....Marland KitchenMarland Kitchenpatient would like to know if there is any medication that can be prescribed

## 2015-08-01 NOTE — Telephone Encounter (Signed)
Pt. States that now all fingers on both hands are numbed..in her prior appt. She informed pcp that one hand was numbed.Marland KitchenMarland Kitchen

## 2015-08-01 NOTE — Telephone Encounter (Signed)
neurotology referral placed

## 2015-08-02 NOTE — Telephone Encounter (Signed)
Patient advised to continue gabapentin.  

## 2015-08-08 ENCOUNTER — Telehealth: Payer: Self-pay | Admitting: Family Medicine

## 2015-08-08 DIAGNOSIS — E1142 Type 2 diabetes mellitus with diabetic polyneuropathy: Secondary | ICD-10-CM

## 2015-08-08 MED ORDER — PREGABALIN 50 MG PO CAPS: 50.0000 mg | ORAL_CAPSULE | Freq: Three times a day (TID) | ORAL | Status: DC

## 2015-08-08 MED FILL — $LANTUS SOLOSTAR 100 UNITS/: 100 | 30 days supply | Qty: 15 | Fill #1

## 2015-08-08 MED FILL — !NOVOLOG 70/30 FLEXPEN: 70-30 | 30 days supply | Qty: 6 | Fill #2

## 2015-08-08 NOTE — Telephone Encounter (Signed)
Larene Beach from [employer] to verify lyrica dose and disp #  lyrica Rx verified Disp # should be 270 for 90 day supply.   Fax # [phone]

## 2015-08-09 NOTE — Telephone Encounter (Signed)
Rx was fax today

## 2015-08-14 ENCOUNTER — Other Ambulatory Visit: Payer: Self-pay | Admitting: Family Medicine

## 2015-08-14 DIAGNOSIS — Z1231 Encounter for screening mammogram for malignant neoplasm of breast: Secondary | ICD-10-CM

## 2015-08-26 ENCOUNTER — Other Ambulatory Visit: Payer: Self-pay | Admitting: Family Medicine

## 2015-08-26 MED FILL — GABAPENTIN 300 MG CAPSULE: 300 | 30 days supply | Qty: 120 | Fill #1

## 2015-08-26 MED FILL — POTASSIUM CL ER 20 MEQ TAB: 20 | 30 days supply | Qty: 60 | Fill #1

## 2015-08-26 MED FILL — raNITIdine HCL 150 MG TABS: 150 | 30 days supply | Qty: 60 | Fill #2

## 2015-08-26 MED FILL — FUROSEMIDE 40 MG TABLET: 40 | 30 days supply | Qty: 60 | Fill #1

## 2015-08-26 MED FILL — LISINOPRIL 40 MG TABLET: 40 | 30 days supply | Qty: 30 | Fill #2

## 2015-08-27 NOTE — Telephone Encounter (Signed)
Pt. Called requesting to speak to nurse. Pt stated she needs a note from her PCP stating that she can go back to the gym. Pt. Could not go to the gym because she was sick. Please f/u with pt.

## 2015-08-29 NOTE — Telephone Encounter (Signed)
Pt. Came in today requesting to speak to the nurse regarding a note that she needs stating  that she can go back to the gym. Pt. Could not go to the gym because she was sick. Pt. Also needs to speak to the nurse regarding her hands and feet.Please f/u with pt.

## 2015-08-30 MED FILL — VALACYCLOVIR HCL 500 MG TAB: 500 | 30 days supply | Qty: 30 | Fill #0

## 2015-09-03 MED FILL — CARVEDILOL 25 MG TABLET: 25 | 30 days supply | Qty: 60 | Fill #2

## 2015-09-03 MED FILL — $JANUVIA 50 MG TABLET: 50 | 30 days supply | Qty: 30 | Fill #1

## 2015-09-03 MED FILL — !NOVOLOG 70/30 FLEXPEN: 70-30 | 30 days supply | Qty: 6 | Fill #3

## 2015-09-05 ENCOUNTER — Ambulatory Visit: Payer: Self-pay | Attending: Family Medicine | Admitting: Family Medicine

## 2015-09-05 ENCOUNTER — Encounter: Payer: Self-pay | Admitting: Family Medicine

## 2015-09-05 VITALS — BP 160/80 | HR 60 | Temp 98.5°F | Resp 16 | Wt 283.0 lb

## 2015-09-05 DIAGNOSIS — Z87891 Personal history of nicotine dependence: Secondary | ICD-10-CM | POA: Insufficient documentation

## 2015-09-05 DIAGNOSIS — M79643 Pain in unspecified hand: Secondary | ICD-10-CM | POA: Insufficient documentation

## 2015-09-05 DIAGNOSIS — E118 Type 2 diabetes mellitus with unspecified complications: Secondary | ICD-10-CM

## 2015-09-05 DIAGNOSIS — F32A Depression, unspecified: Secondary | ICD-10-CM | POA: Insufficient documentation

## 2015-09-05 DIAGNOSIS — E1140 Type 2 diabetes mellitus with diabetic neuropathy, unspecified: Secondary | ICD-10-CM | POA: Insufficient documentation

## 2015-09-05 DIAGNOSIS — I5032 Chronic diastolic (congestive) heart failure: Secondary | ICD-10-CM

## 2015-09-05 DIAGNOSIS — I5033 Acute on chronic diastolic (congestive) heart failure: Secondary | ICD-10-CM | POA: Insufficient documentation

## 2015-09-05 DIAGNOSIS — Z794 Long term (current) use of insulin: Secondary | ICD-10-CM | POA: Insufficient documentation

## 2015-09-05 DIAGNOSIS — F329 Major depressive disorder, single episode, unspecified: Secondary | ICD-10-CM

## 2015-09-05 DIAGNOSIS — E1142 Type 2 diabetes mellitus with diabetic polyneuropathy: Secondary | ICD-10-CM

## 2015-09-05 DIAGNOSIS — E1165 Type 2 diabetes mellitus with hyperglycemia: Secondary | ICD-10-CM

## 2015-09-05 DIAGNOSIS — Z79899 Other long term (current) drug therapy: Secondary | ICD-10-CM | POA: Insufficient documentation

## 2015-09-05 DIAGNOSIS — Z7982 Long term (current) use of aspirin: Secondary | ICD-10-CM | POA: Insufficient documentation

## 2015-09-05 DIAGNOSIS — I10 Essential (primary) hypertension: Secondary | ICD-10-CM

## 2015-09-05 LAB — GLUCOSE, POCT (MANUAL RESULT ENTRY): POC Glucose: 242 mg/dl — AB (ref 70–99)

## 2015-09-05 LAB — POCT GLYCOSYLATED HEMOGLOBIN (HGB A1C): Hemoglobin A1C: 9.4

## 2015-09-05 MED ORDER — DIAZEPAM 5 MG PO TABS: 5.0000 mg | ORAL_TABLET | Freq: Two times a day (BID) | ORAL | Status: DC | PRN

## 2015-09-05 MED ORDER — DULOXETINE HCL 30 MG PO CPEP: 30.0000 mg | ORAL_CAPSULE | Freq: Every day | ORAL | Status: DC

## 2015-09-05 MED ORDER — FUROSEMIDE 40 MG PO TABS: ORAL_TABLET | ORAL | Status: DC

## 2015-09-05 MED ORDER — PREGABALIN 100 MG PO CAPS: 100.0000 mg | ORAL_CAPSULE | Freq: Three times a day (TID) | ORAL | Status: DC

## 2015-09-05 MED FILL — FUROSEMIDE 40 MG TABLET: 40 | 30 days supply | Qty: 90 | Fill #0

## 2015-09-05 MED FILL — DULoxetine HCL 30 MG CPEP: 30 | 30 days supply | Qty: 30 | Fill #0

## 2015-09-05 NOTE — Assessment & Plan Note (Signed)
A: uncontrolled. Worsening. Med: partially compliant  P: Continue current regimen as prescribed  Exercise Treat depression

## 2015-09-05 NOTE — Assessment & Plan Note (Signed)
A: recurrent depression with chronic pain and neuropathy  P: cymbalta Valium  Patient advised to seek counseling Exercise, letter written per patient request for return to the gym

## 2015-09-05 NOTE — Patient Instructions (Addendum)
[name] was seen today for back pain, toe pain, hand pain and diabetes.  Diagnoses and all orders for this visit:  Uncontrolled type 2 diabetes mellitus with complication, unspecified long term insulin use status (HCC) -     POCT glycosylated hemoglobin (Hb A1C) -     POCT glucose (manual entry)  Depression -     diazepam (VALIUM) 5 MG tablet; Take 1 tablet (5 mg total) by mouth every 12 (twelve) hours as needed for anxiety. -     DULoxetine (CYMBALTA) 30 MG capsule; Take 1 capsule (30 mg total) by mouth daily.  Acute on chronic diastolic congestive heart failure, NYHA class 2 (HCC) -     furosemide (LASIX) 40 MG tablet; 80 mg at 8 AM and 40 mg 2 PM  Diabetic polyneuropathy associated with type 2 diabetes mellitus (HCC) -     pregabalin (LYRICA) 100 MG capsule; Take 1 capsule (100 mg total) by mouth 3 (three) times daily.   Drop off lyrica dose increase at pharmacy for PASS  Please start januvia, if not available please give me a call Try taking first dose of novolog with food    F/u in 3-4  weeks for depression   Dr. Adrian Blackwater

## 2015-09-05 NOTE — Progress Notes (Signed)
Subjective:  Patient ID: [name], female    DOB: 03-20-1956  Age: 60 y.o. MRN: [id number]  CC: Back Pain; Toe Pain; Hand Pain; and Diabetes   HPI [name] presents for    1. Depression: she has history of depression. She has been treated in the past with zoloft and xanax. She has hx of panic attacks in 2010. She reports depressed mood again x 4 months. She had a panic attack with excessive crying 2 days ago. She reports poor appetite. She reports lack of interest in cleaning which is a big change for her.   2. CHRONIC DIABETES with neuropathy. She is taking lyrica 50 mg TID and still taking gabapentin.   Disease Monitoring  Blood Sugar Ranges: 70-312  Polyuria: no   Visual problems: no   Medication Compliance: no, not taking AM novolog 70/30, not taking [place].  Medication Side Effects  Hypoglycemia: no   Preventitive Health Care  Eye Exam: due     3. HTN: she did not take her BP medicine today. No HA, CP or SOB. Slight leg swelling.   Social History  Substance Use Topics  . Smoking status: Former Smoker    Quit date: 10/25/1980  . Smokeless tobacco: Never Used     Comment: quit smoking in 1982  . Alcohol Use: Yes     Comment: 3 times in last year    Outpatient Prescriptions Prior to Visit  Medication Sig Dispense Refill  . albuterol (PROAIR HFA) 108 (90 BASE) MCG/ACT inhaler Inhale 2 puffs into the lungs every 4 (four) hours as needed for wheezing. 3 Inhaler 3  . albuterol (PROVENTIL) (2.5 MG/3ML) 0.083% nebulizer solution Take 3 mLs (2.5 mg total) by nebulization every 6 (six) hours as needed for wheezing or shortness of breath. 150 mL 1  . aspirin 325 MG tablet Take 1 tablet (325 mg total) by mouth daily. 30 tablet 3  . Blood Glucose Monitoring Suppl (TRUE METRIX METER) W/DEVICE KIT 1 each by Does not apply route as needed. 1 kit 0  . carvedilol (COREG) 25 MG tablet Take 1 tablet (25 mg total) by mouth 2 (two) times daily with a meal. 60 tablet 6  .  diclofenac sodium (VOLTAREN) 1 % GEL Apply 4 g topically 4 (four) times daily. 100 g 2  . Fluticasone-Salmeterol (ADVAIR DISKUS) 250-50 MCG/DOSE AEPB Inhale 1 puff into the lungs 2 (two) times daily. 60 each 1  . furosemide (LASIX) 40 MG tablet Take 1 tablet (40 mg total) by mouth 2 (two) times daily. 8 AM and 2 PM 60 tablet 5  . gabapentin (NEURONTIN) 300 MG capsule Take 2 capsules (600 mg total) by mouth 2 (two) times daily. 120 capsule 3  . glucose blood (TRUE METRIX BLOOD GLUCOSE TEST) test strip 1 each by Other route 3 (three) times daily. 100 each 11  . hydrALAZINE (APRESOLINE) 25 MG tablet Take 1 tablet (25 mg total) by mouth 2 (two) times daily. 60 tablet 5  . insulin aspart protamine - aspart (NOVOLOG MIX 70/30 FLEXPEN) (70-30) 100 UNIT/ML FlexPen Inject 0.1 mLs (10 Units total) into the skin 2 (two) times daily with a meal. 15 mL 5  . Insulin Glargine (LANTUS SOLOSTAR) 100 UNIT/ML Solostar Pen Inject 50 Units into the skin daily at 10 pm. 5 pen 6  . ketotifen (ZADITOR) 0.025 % ophthalmic solution Place 1 drop into both eyes 2 (two) times daily. 10 mL 1  . lisinopril (PRINIVIL,ZESTRIL) 40 MG tablet TAKE 1  TABLET BY MOUTH DAILY 30 tablet 3  . Potassium Chloride ER 20 MEQ TBCR Take 20 mEq by mouth 2 (two) times daily. With lasix 60 tablet 2  . pregabalin (LYRICA) 50 MG capsule Take 1 capsule (50 mg total) by mouth 3 (three) times daily. 270 capsule 3  . ranitidine (ZANTAC) 150 MG tablet TAKE 1 TABLET (150 MG TOTAL) BY MOUTH 2 (TWO) TIMES DAILY. 60 tablet 2  . valACYclovir (VALTREX) 500 MG tablet TAKE 1 TABLET BY MOUTH DAILY AS NEEDED FOR OUTBREAK 30 tablet 2  . atorvastatin (LIPITOR) 20 MG tablet Take 1 tablet (20 mg total) by mouth daily. (Patient not taking: Reported on 09/05/2015) 30 tablet 3  . hydrOXYzine (ATARAX/VISTARIL) 25 MG tablet Take 1 tablet (25 mg total) by mouth 3 (three) times daily as needed for itching. (Patient not taking: Reported on 09/05/2015) 30 tablet 2  . ketoconazole  (NIZORAL) 2 % cream Apply 1 application topically daily. Apply to feet (Patient not taking: Reported on 09/05/2015) 15 g 0  . sitaGLIPtin (JANUVIA) 50 MG tablet Take 1 tablet (50 mg total) by mouth daily. (Patient not taking: Reported on 09/05/2015) 90 tablet 3  . traMADol (ULTRAM) 50 MG tablet Take 1 tablet (50 mg total) by mouth every 8 (eight) hours as needed. (Patient not taking: Reported on 09/05/2015) 30 tablet 0  . atorvastatin (LIPITOR) 20 MG tablet TAKE 1 TABLET BY MOUTH DAILY 30 tablet 3   No facility-administered medications prior to visit.    ROS Review of Systems  Constitutional: Positive for appetite change. Negative for fever and chills.  Eyes: Negative for visual disturbance.  Respiratory: Negative for shortness of breath.   Cardiovascular: Positive for leg swelling (worsened when lasix changed to once daily ). Negative for chest pain.  Gastrointestinal: Negative for abdominal pain and blood in stool.  Musculoskeletal: Positive for joint swelling (in R hand ) and arthralgias (in R hand ). Negative for back pain.  Skin: Negative for rash.  Allergic/Immunologic: Negative for immunocompromised state.  Neurological: Positive for numbness (pains and numbness in feet ).  Hematological: Negative for adenopathy. Does not bruise/bleed easily.  Psychiatric/Behavioral: Positive for sleep disturbance and dysphoric mood. Negative for suicidal ideas.    Objective:  BP 160/80 mmHg  Pulse 60  Temp(Src) 98.5 F (36.9 C) (Oral)  Resp 16  Wt 283 lb (128.368 kg)  SpO2 100%  BP/Weight 09/05/2015 06/27/2015 76/54/6503  Systolic BP 546 568 127  Diastolic BP 80 80 74  Wt. (Lbs) 283 276 275  BMI 43.04 41.98 41.82   Physical Exam  Constitutional: She is oriented to person, place, and time. She appears well-developed and well-nourished. No distress.  Obese   HENT:  Head: Normocephalic and atraumatic.  Cardiovascular: Normal rate, regular rhythm, normal heart sounds and intact distal  pulses.   Pulmonary/Chest: Effort normal and breath sounds normal.  Musculoskeletal: She exhibits edema (trace in LE ).  Neurological: She is alert and oriented to person, place, and time.  Skin: Skin is warm and dry. No rash noted.  Psychiatric: She has a normal mood and affect.   Lab Results  Component Value Date   HGBA1C 10.50 04/29/2015   CBG 234 Depression screen [facility] 2/9 09/05/2015 04/29/2015 04/08/2015  Decreased Interest 2 1 0  Down, Depressed, Hopeless 1 1 0  PHQ - 2 Score 3 2 0  Altered sleeping 0 1 -  Tired, decreased energy 3 3 -  Change in appetite 1 2 -  Feeling bad or failure  about yourself  0 1 -  Trouble concentrating 3 3 -  Moving slowly or fidgety/restless 2 1 -  Suicidal thoughts 0 0 -  PHQ-9 Score 12 13 -    GAD 7 : Generalized Anxiety Score 09/05/2015  Nervous, Anxious, on Edge 2  Control/stop worrying 3  Worry too much - different things 3  Trouble relaxing 3  Restless 2  Easily annoyed or irritable 3  Afraid - awful might happen 3  Total GAD 7 Score 19     Assessment & Plan:  Haydan was seen today for back pain, toe pain, hand pain and diabetes.  Diagnoses and all orders for this visit:  Uncontrolled type 2 diabetes mellitus with complication, unspecified long term insulin use status (HCC) -     POCT glycosylated hemoglobin (Hb A1C) -     POCT glucose (manual entry)  Depression -     diazepam (VALIUM) 5 MG tablet; Take 1 tablet (5 mg total) by mouth every 12 (twelve) hours as needed for anxiety. -     DULoxetine (CYMBALTA) 30 MG capsule; Take 1 capsule (30 mg total) by mouth daily.  Acute on chronic diastolic congestive heart failure, NYHA class 2 (HCC) -     furosemide (LASIX) 40 MG tablet; 80 mg at 8 AM and 40 mg 2 PM  Diabetic polyneuropathy associated with type 2 diabetes mellitus (HCC) -     pregabalin (LYRICA) 100 MG capsule; Take 1 capsule (100 mg total) by mouth 3 (three) times daily.    No orders of the defined types were placed  in this encounter.    Follow-up: No Follow-up on file.   Boykin Nearing MD

## 2015-09-05 NOTE — Assessment & Plan Note (Signed)
A; persistent, unchanged with lyrica 50 mg TID P: Increase lyrica to 100 mg TID Add valium

## 2015-09-05 NOTE — Progress Notes (Signed)
F/U DM- glucose running 70-312  C/C Pain on hands feet and back  Medication refills  Pain on and off  Pain scale 10  No tobacco user  Suicidal thought in the past two weeks

## 2015-09-05 NOTE — Assessment & Plan Note (Signed)
A: BP above goal, no meds today P: Continue current regimen

## 2015-09-06 ENCOUNTER — Encounter: Payer: Self-pay | Admitting: Clinical

## 2015-09-06 NOTE — Progress Notes (Signed)
Depression screen [facility] 2/9 09/05/2015 04/29/2015 04/08/2015 03/14/2015 01/31/2015  Decreased Interest 2 1 0 0 0  Down, Depressed, Hopeless 1 1 0 0 1  PHQ - 2 Score 3 2 0 0 1  Altered sleeping 0 1 - - -  Tired, decreased energy 3 3 - - -  Change in appetite 1 2 - - -  Feeling bad or failure about yourself  0 1 - - -  Trouble concentrating 3 3 - - -  Moving slowly or fidgety/restless 2 1 - - -  Suicidal thoughts 0 0 - - -  PHQ-9 Score 12 13 - - -    GAD 7 : Generalized Anxiety Score 09/05/2015  Nervous, Anxious, on Edge 2  Control/stop worrying 3  Worry too much - different things 3  Trouble relaxing 3  Restless 2  Easily annoyed or irritable 3  Afraid - awful might happen 3  Total GAD 7 Score 19     04-29-15: GAD7-14  03-25-15: PHQ9: 9/ GAD7: 4

## 2015-09-14 ENCOUNTER — Emergency Department (HOSPITAL_COMMUNITY)
Admission: EM | Admit: 2015-09-14 | Discharge: 2015-09-14 | Disposition: A | Discharge status: Home / Self Care | Payer: Medicaid Other | Attending: Emergency Medicine | Admitting: Emergency Medicine

## 2015-09-14 ENCOUNTER — Encounter (HOSPITAL_COMMUNITY): Payer: Self-pay | Admitting: Emergency Medicine

## 2015-09-14 DIAGNOSIS — F329 Major depressive disorder, single episode, unspecified: Secondary | ICD-10-CM | POA: Insufficient documentation

## 2015-09-14 DIAGNOSIS — I509 Heart failure, unspecified: Secondary | ICD-10-CM | POA: Insufficient documentation

## 2015-09-14 DIAGNOSIS — R519 Headache, unspecified: Secondary | ICD-10-CM

## 2015-09-14 DIAGNOSIS — Z794 Long term (current) use of insulin: Secondary | ICD-10-CM | POA: Insufficient documentation

## 2015-09-14 DIAGNOSIS — Z7982 Long term (current) use of aspirin: Secondary | ICD-10-CM | POA: Insufficient documentation

## 2015-09-14 DIAGNOSIS — Z872 Personal history of diseases of the skin and subcutaneous tissue: Secondary | ICD-10-CM | POA: Insufficient documentation

## 2015-09-14 DIAGNOSIS — J45909 Unspecified asthma, uncomplicated: Secondary | ICD-10-CM | POA: Insufficient documentation

## 2015-09-14 DIAGNOSIS — Z8619 Personal history of other infectious and parasitic diseases: Secondary | ICD-10-CM | POA: Insufficient documentation

## 2015-09-14 DIAGNOSIS — Z87891 Personal history of nicotine dependence: Secondary | ICD-10-CM | POA: Insufficient documentation

## 2015-09-14 DIAGNOSIS — R51 Headache: Secondary | ICD-10-CM | POA: Insufficient documentation

## 2015-09-14 DIAGNOSIS — R42 Dizziness and giddiness: Secondary | ICD-10-CM | POA: Insufficient documentation

## 2015-09-14 DIAGNOSIS — Z7984 Long term (current) use of oral hypoglycemic drugs: Secondary | ICD-10-CM | POA: Insufficient documentation

## 2015-09-14 DIAGNOSIS — Z8719 Personal history of other diseases of the digestive system: Secondary | ICD-10-CM | POA: Insufficient documentation

## 2015-09-14 DIAGNOSIS — Z79899 Other long term (current) drug therapy: Secondary | ICD-10-CM | POA: Insufficient documentation

## 2015-09-14 DIAGNOSIS — E119 Type 2 diabetes mellitus without complications: Secondary | ICD-10-CM | POA: Insufficient documentation

## 2015-09-14 DIAGNOSIS — I10 Essential (primary) hypertension: Secondary | ICD-10-CM | POA: Insufficient documentation

## 2015-09-14 LAB — CBC
HCT: 37.5 % (ref 36.0–46.0)
Hemoglobin: 11.7 g/dL — ABNORMAL LOW (ref 12.0–15.0)
MCH: 27.6 pg (ref 26.0–34.0)
MCHC: 31.2 g/dL (ref 30.0–36.0)
MCV: 88.4 fL (ref 78.0–100.0)
Platelets: 225 10*3/uL (ref 150–400)
RBC: 4.24 MIL/uL (ref 3.87–5.11)
RDW: 15 % (ref 11.5–15.5)
WBC: 5.4 10*3/uL (ref 4.0–10.5)

## 2015-09-14 LAB — I-STAT CHEM 8, ED
BUN: 54 mg/dL — ABNORMAL HIGH (ref 6–20)
Calcium, Ion: 1 mmol/L — ABNORMAL LOW (ref 1.12–1.23)
Chloride: 105 mmol/L (ref 101–111)
Creatinine, Ser: 1.8 mg/dL — ABNORMAL HIGH (ref 0.44–1.00)
Glucose, Bld: 218 mg/dL — ABNORMAL HIGH (ref 65–99)
HCT: 39 % (ref 36.0–46.0)
Hemoglobin: 13.3 g/dL (ref 12.0–15.0)
Potassium: 4.3 mmol/L (ref 3.5–5.1)
Sodium: 142 mmol/L (ref 135–145)
TCO2: 25 mmol/L (ref 0–100)

## 2015-09-14 LAB — DIFFERENTIAL
Basophils Absolute: 0 10*3/uL (ref 0.0–0.1)
Basophils Relative: 1 %
Eosinophils Absolute: 0.3 10*3/uL (ref 0.0–0.7)
Eosinophils Relative: 5 %
Lymphocytes Relative: 33 %
Lymphs Abs: 1.8 10*3/uL (ref 0.7–4.0)
Monocytes Absolute: 0.4 10*3/uL (ref 0.1–1.0)
Monocytes Relative: 7 %
Neutro Abs: 3 10*3/uL (ref 1.7–7.7)
Neutrophils Relative %: 54 %

## 2015-09-14 LAB — COMPREHENSIVE METABOLIC PANEL
ALT: 27 U/L (ref 14–54)
AST: 32 U/L (ref 15–41)
Albumin: 2.8 g/dL — ABNORMAL LOW (ref 3.5–5.0)
Alkaline Phosphatase: 132 U/L — ABNORMAL HIGH (ref 38–126)
Anion gap: 9 (ref 5–15)
BUN: 61 mg/dL — ABNORMAL HIGH (ref 6–20)
CO2: 25 mmol/L (ref 22–32)
Calcium: 8.2 mg/dL — ABNORMAL LOW (ref 8.9–10.3)
Chloride: 108 mmol/L (ref 101–111)
Creatinine, Ser: 1.89 mg/dL — ABNORMAL HIGH (ref 0.44–1.00)
GFR calc Af Amer: 33 mL/min — ABNORMAL LOW (ref >60)
GFR calc non Af Amer: 28 mL/min — ABNORMAL LOW (ref >60)
Glucose, Bld: 224 mg/dL — ABNORMAL HIGH (ref 65–99)
Potassium: 4.5 mmol/L (ref 3.5–5.1)
Sodium: 142 mmol/L (ref 135–145)
Total Bilirubin: 0.2 mg/dL — ABNORMAL LOW (ref 0.3–1.2)
Total Protein: 6.4 g/dL — ABNORMAL LOW (ref 6.5–8.1)

## 2015-09-14 LAB — PROTIME-INR
INR: 1.02 (ref 0.00–1.49)
Prothrombin Time: 13.6 seconds (ref 11.6–15.2)

## 2015-09-14 LAB — I-STAT TROPONIN, ED: Troponin i, poc: 0 ng/mL (ref 0.00–0.08)

## 2015-09-14 LAB — APTT: aPTT: 28 seconds (ref 24–37)

## 2015-09-14 MED ORDER — MECLIZINE HCL 25 MG PO TABS
25.0000 mg | ORAL_TABLET | Freq: Once | ORAL | Status: AC
  Administered 2015-09-14: 25 mg via ORAL
  Filled 2015-09-14: qty 1

## 2015-09-14 MED ORDER — MECLIZINE HCL 12.5 MG PO TABS: 12.5000 mg | ORAL_TABLET | Freq: Three times a day (TID) | ORAL | Status: DC | PRN

## 2015-09-14 MED ORDER — DIPHENHYDRAMINE HCL 25 MG PO CAPS
25.0000 mg | ORAL_CAPSULE | Freq: Once | ORAL | Status: AC
  Administered 2015-09-14: 25 mg via ORAL
  Filled 2015-09-14: qty 1

## 2015-09-14 MED ORDER — METOCLOPRAMIDE HCL 10 MG PO TABS
5.0000 mg | ORAL_TABLET | Freq: Once | ORAL | Status: AC
  Administered 2015-09-14: 5 mg via ORAL
  Filled 2015-09-14: qty 1

## 2015-09-14 MED ORDER — KETOROLAC TROMETHAMINE 30 MG/ML IJ SOLN
30.0000 mg | Freq: Once | INTRAMUSCULAR | Status: AC
  Administered 2015-09-14: 30 mg via INTRAMUSCULAR
  Filled 2015-09-14: qty 1

## 2015-09-14 NOTE — ED Provider Notes (Signed)
CSN: [id number]     Arrival date & time 09/14/15  1235 History   First MD Initiated Contact with Patient 09/14/15 1406     Chief Complaint  Patient presents with  . Dizziness  . Headache   HPI   60 year old female presents today with numerous complaints. Patient reports that she was recently started on diazepam and Zoloft for depression. She's been taking this medication but reports it makes her feel "weird". She reports she feels detached on the medication and discontinued taking it. She reports that after taking it she continued to have abnormal sensations and "feelings in her head". She notes that 3 days ago she developed dizziness. She reports this was when she would stand up and attempted to ambulate, reports that after closing her eyes and sitting down symptoms resolved. She reports developing a headache over the last 2 days described as frontal, she denies any focal neurological deficits; no headache read flags. She states that these not start the same time, both were slow onset. Patient reports his headache feels similar to previous, but notes that she does not often get headaches. Headache is not severe enough for her to take any medication at home. Patient also notes that she's been taking Benadryl throughout the day for the last 3 days for sinus congestion and seasonal allergies.  She reports that the dizziness is not always present when going from sitting to standing or ambulating, symptoms present when she is sitting and moving her head side to side. She denies any vision changes, ringing in the ears, ear pain, fever, chills, nausea, vomiting, neck stiffness or pain, chest pain, shortness of breath and palpitations. No history of previous vertigo. No head trauma or neurological deficits  Past Medical History  Diagnosis Date  . CHF (congestive heart failure) ([place]) 2011  . Bronchitis   . Diabetes mellitus Dx 1989  . Hypertension Dx 1989  . Ulcer 2010  . Hepatitis C Dx 2013  .  Pancreatitis 2013  . Complication of anesthesia   . Refusal of blood transfusions as patient is Jehovah's Witness   . Asthma     As a child    Past Surgical History  Procedure Laterality Date  . Tubal ligation    . Cholecystectomy    . Knee arthroscopy    . Tubal ligation    . Eye surgery     Family History  Problem Relation Age of Onset  . Colon cancer Mother   . Heart attack Other   . Heart attack Maternal Grandmother   . Hypertension Sister   . Hypertension Brother   . Diabetes Paternal Grandmother    Social History  Substance Use Topics  . Smoking status: Former Smoker    Quit date: 10/25/1980  . Smokeless tobacco: Never Used     Comment: quit smoking in 1982  . Alcohol Use: Yes     Comment: 3 times in last year   OB History    No data available     Review of Systems  All other systems reviewed and are negative.     Allergies  Shellfish allergy and Morphine and related  Home Medications   Prior to Admission medications   Medication Sig Start Date End Date Taking? Authorizing Provider  albuterol (PROAIR HFA) 108 (90 BASE) MCG/ACT inhaler Inhale 2 puffs into the lungs every 4 (four) hours as needed for wheezing. 04/08/15  Yes Arnoldo Morale, MD  albuterol (PROVENTIL) (2.5 MG/3ML) 0.083% nebulizer solution Take 3 mLs (2.5  mg total) by nebulization every 6 (six) hours as needed for wheezing or shortness of breath. 03/25/15  Yes Arnoldo Morale, MD  aspirin 325 MG tablet Take 1 tablet (325 mg total) by mouth daily. 06/27/15  Yes Boykin Nearing, MD  carvedilol (COREG) 25 MG tablet Take 1 tablet (25 mg total) by mouth 2 (two) times daily with a meal. 06/27/15  Yes Josalyn Funches, MD  diclofenac sodium (VOLTAREN) 1 % GEL Apply 4 g topically 4 (four) times daily. Patient taking differently: Apply 4 g topically daily as needed (for pain).  06/27/15  Yes Josalyn Funches, MD  furosemide (LASIX) 40 MG tablet 80 mg at 8 AM and 40 mg 2 PM Patient taking differently: Take 40-80  mg by mouth 2 (two) times daily. 80 mg at 8 AM and 40 mg 2 PM 09/05/15  Yes Josalyn Funches, MD  hydrALAZINE (APRESOLINE) 25 MG tablet Take 1 tablet (25 mg total) by mouth 2 (two) times daily. 05/17/15  Yes Josalyn Funches, MD  insulin aspart protamine - aspart (NOVOLOG MIX 70/30 FLEXPEN) (70-30) 100 UNIT/ML FlexPen Inject 0.1 mLs (10 Units total) into the skin 2 (two) times daily with a meal. 04/29/15  Yes Josalyn Funches, MD  Insulin Glargine (LANTUS SOLOSTAR) 100 UNIT/ML Solostar Pen Inject 50 Units into the skin daily at 10 pm. 03/25/15  Yes Arnoldo Morale, MD  ketoconazole (NIZORAL) 2 % cream Apply 1 application topically daily as needed for irritation.  07/04/15  Yes Historical Provider, MD  ketotifen (ZADITOR) 0.025 % ophthalmic solution Place 1 drop into both eyes 2 (two) times daily. Patient taking differently: Place 1 drop into both eyes daily as needed (for dry eyes).  03/14/15  Yes Josalyn Funches, MD  lisinopril (PRINIVIL,ZESTRIL) 40 MG tablet TAKE 1 TABLET BY MOUTH DAILY 06/18/15  Yes Josalyn Funches, MD  Potassium Chloride ER 20 MEQ TBCR Take 20 mEq by mouth 2 (two) times daily. With lasix 06/27/15  Yes Josalyn Funches, MD  pregabalin (LYRICA) 100 MG capsule Take 1 capsule (100 mg total) by mouth 3 (three) times daily. 09/05/15  Yes Josalyn Funches, MD  ranitidine (ZANTAC) 150 MG tablet TAKE 1 TABLET (150 MG TOTAL) BY MOUTH 2 (TWO) TIMES DAILY. 06/18/15  Yes Josalyn Funches, MD  sitaGLIPtin (JANUVIA) 50 MG tablet Take 1 tablet (50 mg total) by mouth daily. 07/02/15  Yes Josalyn Funches, MD  valACYclovir (VALTREX) 500 MG tablet TAKE 1 TABLET BY MOUTH DAILY AS NEEDED FOR OUTBREAK 08/30/15  Yes Josalyn Funches, MD  atorvastatin (LIPITOR) 20 MG tablet Take 1 tablet (20 mg total) by mouth daily. Patient not taking: Reported on 09/05/2015 01/24/15   Boykin Nearing, MD  Blood Glucose Monitoring Suppl (TRUE METRIX METER) W/DEVICE KIT 1 each by Does not apply route as needed. 05/20/15   Josalyn Funches, MD   diazepam (VALIUM) 5 MG tablet Take 1 tablet (5 mg total) by mouth every 12 (twelve) hours as needed for anxiety. 09/05/15   Josalyn Funches, MD  DULoxetine (CYMBALTA) 30 MG capsule Take 1 capsule (30 mg total) by mouth daily. 09/05/15   Josalyn Funches, MD  Fluticasone-Salmeterol (ADVAIR DISKUS) 250-50 MCG/DOSE AEPB Inhale 1 puff into the lungs 2 (two) times daily. 03/25/15   Arnoldo Morale, MD  gabapentin (NEURONTIN) 300 MG capsule Take 2 capsules (600 mg total) by mouth 2 (two) times daily. 06/27/15   Josalyn Funches, MD  glucose blood (TRUE METRIX BLOOD GLUCOSE TEST) test strip 1 each by Other route 3 (three) times daily. 05/20/15   Josalyn Funches,  MD  meclizine (ANTIVERT) 12.5 MG tablet Take 1 tablet (12.5 mg total) by mouth 3 (three) times daily as needed for dizziness. 09/14/15   Bleu Minerd, PA-C   BP 153/55 mmHg  Pulse 61  Temp(Src) 97.2 F (36.2 C) (Oral)  Resp 16  Wt 125.306 kg  SpO2 100%   Physical Exam  Constitutional: She is oriented to person, place, and time. She appears well-developed and well-nourished.  HENT:  Head: Normocephalic and atraumatic.  Eyes: Conjunctivae and EOM are normal. Pupils are equal, round, and reactive to light. Right eye exhibits no discharge. Left eye exhibits no discharge. No scleral icterus. Right eye exhibits normal extraocular motion and no nystagmus. Left eye exhibits normal extraocular motion and no nystagmus.  Neck: Normal range of motion. No JVD present. No tracheal deviation present.  Cardiovascular: Normal rate, regular rhythm, normal heart sounds and intact distal pulses.  Exam reveals no gallop and no friction rub.   No murmur heard. Pulmonary/Chest: Effort normal and breath sounds normal. No stridor. No respiratory distress. She has no wheezes. She has no rales. She exhibits no tenderness.  Musculoskeletal: Normal range of motion. She exhibits no edema or tenderness.  Neurological: She is alert and oriented to person, place, and time. She  has normal strength. No cranial nerve deficit or sensory deficit. Coordination normal. GCS eye subscore is 4. GCS verbal subscore is 5. GCS motor subscore is 6.  Reflex Scores:      Patellar reflexes are 2+ on the right side and 2+ on the left side. Skin: Skin is warm and dry. No rash noted. No erythema. No pallor.  Psychiatric: She has a normal mood and affect. Her behavior is normal. Judgment and thought content normal.  Nursing note and vitals reviewed.  Dizziness with side-to-side head movements, improved with closing her eyes, dizziness upon standing. No associated shortness of breath chest pain  ED Course  Procedures (including critical care time) Labs Review Labs Reviewed  CBC - Abnormal; Notable for the following:    Hemoglobin 11.7 (*)    All other components within normal limits  COMPREHENSIVE METABOLIC PANEL - Abnormal; Notable for the following:    Glucose, Bld 224 (*)    BUN 61 (*)    Creatinine, Ser 1.89 (*)    Calcium 8.2 (*)    Total Protein 6.4 (*)    Albumin 2.8 (*)    Alkaline Phosphatase 132 (*)    Total Bilirubin 0.2 (*)    GFR calc non Af Amer 28 (*)    GFR calc Af Amer 33 (*)    All other components within normal limits  I-STAT CHEM 8, ED - Abnormal; Notable for the following:    BUN 54 (*)    Creatinine, Ser 1.80 (*)    Glucose, Bld 218 (*)    Calcium, Ion 1.00 (*)    All other components within normal limits  PROTIME-INR  APTT  DIFFERENTIAL  CBG MONITORING, ED  I-STAT TROPOININ, ED    Imaging Review No results found. I have personally reviewed and evaluated these images and lab results as part of my medical decision-making.   EKG Interpretation None      MDM   Final diagnoses:  Vertigo  Nonintractable headache, unspecified chronicity pattern, unspecified headache type    Labs: I-STAT Chem-8, i-STAT troponin, PT/INR, APTT, CBC, CMP- no significant changes from baseline  Imaging:  Consults:  Therapeutics: Toradol, Reglan, Benadryl,  Antivert  Discharge Meds:   Assessment/Plan: 60 year old female presents today  with vertigo. Patient also has a headache, these appear to be separated in nature, she has no neurological deficits. Symptoms are worsened by standing and ambulating, side-to-side head movements. Patient has also been using diazepam, Zoloft, Benadryl which could be contributing to her symptoms here today. Patient was given above medications which resolved the headache and resolve the dizziness. She was ambulated with no dizziness or headache. I very low suspicion for central cause of this vertigo, this is likely peripheral nature. Patient will be instructed to return immediately if any new or worsening signs or symptoms present. She is instructed to use Antivert as for vertigo, follow up with primary care for reevaluation and further management. She verbalizes understanding and agreement today's plan had no further questions or concerns at time of discharge.        Okey Regal, PA-C 09/14/15 [place], MD 09/15/15 [phone]

## 2015-09-14 NOTE — ED Notes (Signed)
Patient able to ambulate independently.  C/o dizziness when first sitting up, but resolved, and patient able to walk with out complaints of dizziness.  Pt states she is "feeling a lot better" and "I want to go home"

## 2015-09-14 NOTE — ED Notes (Signed)
Pt c/o intermittent dizziness and headache x 3 days.

## 2015-09-14 NOTE — Discharge Instructions (Signed)
Benign Positional Vertigo Vertigo is the feeling that you or your surroundings are moving when they are not. Benign positional vertigo is the most common form of vertigo. The cause of this condition is not serious (is benign). This condition is triggered by certain movements and positions (is positional). This condition can be dangerous if it occurs while you are doing something that could endanger you or others, such as driving.  CAUSES In many cases, the cause of this condition is not known. It may be caused by a disturbance in an area of the inner ear that helps your brain to sense movement and balance. This disturbance can be caused by a viral infection (labyrinthitis), head injury, or repetitive motion. RISK FACTORS This condition is more likely to develop in:  Women.  People who are 50 years of age or older. SYMPTOMS Symptoms of this condition usually happen when you move your head or your eyes in different directions. Symptoms may start suddenly, and they usually last for less than a minute. Symptoms may include:  Loss of balance and falling.  Feeling like you are spinning or moving.  Feeling like your surroundings are spinning or moving.  Nausea and vomiting.  Blurred vision.  Dizziness.  Involuntary eye movement (nystagmus). Symptoms can be mild and cause only slight annoyance, or they can be severe and interfere with daily life. Episodes of benign positional vertigo may return (recur) over time, and they may be triggered by certain movements. Symptoms may improve over time. DIAGNOSIS This condition is usually diagnosed by medical history and a physical exam of the head, neck, and ears. You may be referred to a health care provider who specializes in ear, nose, and throat (ENT) problems (otolaryngologist) or a provider who specializes in disorders of the nervous system (neurologist). You may have additional testing, including:  MRI.  A CT scan.  Eye movement tests. Your  health care provider may ask you to change positions quickly while he or she watches you for symptoms of benign positional vertigo, such as nystagmus. Eye movement may be tested with an electronystagmogram (ENG), caloric stimulation, the Dix-Hallpike test, or the roll test.  An electroencephalogram (EEG). This records electrical activity in your brain.  Hearing tests. TREATMENT Usually, your health care provider will treat this by moving your head in specific positions to adjust your inner ear back to normal. Surgery may be needed in severe cases, but this is rare. In some cases, benign positional vertigo may resolve on its own in 2-4 weeks. HOME CARE INSTRUCTIONS Safety  Move slowly.Avoid sudden body or head movements.  Avoid driving.  Avoid operating heavy machinery.  Avoid doing any tasks that would be dangerous to you or others if a vertigo episode would occur.  If you have trouble walking or keeping your balance, try using a cane for stability. If you feel dizzy or unstable, sit down right away.  Return to your normal activities as told by your health care provider. Ask your health care provider what activities are safe for you. General Instructions  Take over-the-counter and prescription medicines only as told by your health care provider.  Avoid certain positions or movements as told by your health care provider.  Drink enough fluid to keep your urine clear or pale yellow.  Keep all follow-up visits as told by your health care provider. This is important. SEEK MEDICAL CARE IF:  You have a fever.  Your condition gets worse or you develop new symptoms.  Your family or friends   notice any behavioral changes.  Your nausea or vomiting gets worse.  You have numbness or a "pins and needles" sensation. SEEK IMMEDIATE MEDICAL CARE IF:  You have difficulty speaking or moving.  You are always dizzy.  You faint.  You develop severe headaches.  You have weakness in your  legs or arms.  You have changes in your hearing or vision.  You develop a stiff neck.  You develop sensitivity to light.   This information is not intended to replace advice given to you by your health care provider. Make sure you discuss any questions you have with your health care provider.   Document Released: 04/06/2006 Document Revised: 03/20/2015 Document Reviewed: 10/22/2014 Elsevier Interactive Patient Education 2016 [employer]  

## 2015-09-14 NOTE — ED Notes (Signed)
Patient able to ambulate independently  

## 2015-09-16 ENCOUNTER — Telehealth: Payer: Self-pay | Admitting: Family Medicine

## 2015-09-16 ENCOUNTER — Other Ambulatory Visit: Payer: Self-pay | Admitting: *Deleted

## 2015-09-16 DIAGNOSIS — H9202 Otalgia, left ear: Secondary | ICD-10-CM

## 2015-09-16 DIAGNOSIS — F32A Depression, unspecified: Secondary | ICD-10-CM

## 2015-09-16 DIAGNOSIS — F329 Major depressive disorder, single episode, unspecified: Secondary | ICD-10-CM

## 2015-09-16 MED ORDER — DULOXETINE HCL 30 MG PO CPEP: 30.0000 mg | ORAL_CAPSULE | Freq: Every day | ORAL | Status: DC

## 2015-09-16 NOTE — Telephone Encounter (Signed)
Patient called states she would like to talk to nurse, Please f/u

## 2015-09-17 MED ORDER — ONDANSETRON HCL 8 MG PO TABS: 8.0000 mg | ORAL_TABLET | Freq: Three times a day (TID) | ORAL | Status: DC | PRN

## 2015-09-17 MED FILL — ONDANSETRON HCL 8 MG TABLET: 8 | 7 days supply | Qty: 20 | Fill #0

## 2015-09-17 NOTE — Telephone Encounter (Signed)
Pt called and left voice message  stating was at ER on Saturday due to HA and could not focus. Was Dx with vertigo. Unable to walk and vomiting   Call Pt LVM to return call  Per Dr Adrian Blackwater Rx Zofran 8 mg Q8 hr PRN for nausea send to [facility]

## 2015-09-18 MED FILL — hydrALAZINE HCL 25 MG TABS: 25 | 30 days supply | Qty: 60 | Fill #0

## 2015-09-19 MED ORDER — AMOXICILLIN 500 MG PO CAPS: 500.0000 mg | ORAL_CAPSULE | Freq: Three times a day (TID) | ORAL | Status: DC

## 2015-09-19 MED FILL — AMOXICILLIN 500 MG CAPSULE: 500 | 10 days supply | Qty: 30 | Fill #0

## 2015-09-19 NOTE — Telephone Encounter (Signed)
Pt stated Lt ear ache and HA Dr Adrian Blackwater Rx antibiotic  Pt advised to schedule appointment with PCP

## 2015-09-19 NOTE — Telephone Encounter (Signed)
Pt. Returned call. Pt. Stated is very important for her to speak with the nurse. Please f/u with pt.

## 2015-09-19 NOTE — Addendum Note (Signed)
Addended by: Boykin Nearing on: 09/19/2015 04:21 PM   Modules accepted: Orders

## 2015-09-20 ENCOUNTER — Encounter: Payer: Self-pay | Admitting: Clinical

## 2015-09-20 ENCOUNTER — Encounter: Payer: Self-pay | Admitting: Family Medicine

## 2015-09-20 ENCOUNTER — Ambulatory Visit: Payer: Self-pay | Attending: Family Medicine | Admitting: Family Medicine

## 2015-09-20 VITALS — BP 137/73 | HR 63 | Temp 98.2°F | Resp 16 | Ht 68.0 in | Wt 278.0 lb

## 2015-09-20 DIAGNOSIS — F329 Major depressive disorder, single episode, unspecified: Secondary | ICD-10-CM | POA: Insufficient documentation

## 2015-09-20 DIAGNOSIS — R42 Dizziness and giddiness: Secondary | ICD-10-CM

## 2015-09-20 DIAGNOSIS — Z7982 Long term (current) use of aspirin: Secondary | ICD-10-CM | POA: Insufficient documentation

## 2015-09-20 DIAGNOSIS — Z87891 Personal history of nicotine dependence: Secondary | ICD-10-CM | POA: Insufficient documentation

## 2015-09-20 DIAGNOSIS — E1165 Type 2 diabetes mellitus with hyperglycemia: Secondary | ICD-10-CM

## 2015-09-20 DIAGNOSIS — Z79899 Other long term (current) drug therapy: Secondary | ICD-10-CM | POA: Insufficient documentation

## 2015-09-20 DIAGNOSIS — I10 Essential (primary) hypertension: Secondary | ICD-10-CM | POA: Insufficient documentation

## 2015-09-20 DIAGNOSIS — E118 Type 2 diabetes mellitus with unspecified complications: Secondary | ICD-10-CM

## 2015-09-20 DIAGNOSIS — R51 Headache: Secondary | ICD-10-CM | POA: Insufficient documentation

## 2015-09-20 DIAGNOSIS — I5033 Acute on chronic diastolic (congestive) heart failure: Secondary | ICD-10-CM

## 2015-09-20 DIAGNOSIS — Z794 Long term (current) use of insulin: Secondary | ICD-10-CM | POA: Insufficient documentation

## 2015-09-20 LAB — GLUCOSE, POCT (MANUAL RESULT ENTRY): POC Glucose: 280 mg/dl — AB (ref 70–99)

## 2015-09-20 MED ORDER — INSULIN ASPART PROT & ASPART (70-30 MIX) 100 UNIT/ML PEN: 15.0000 [IU] | PEN_INJECTOR | Freq: Two times a day (BID) | SUBCUTANEOUS | Status: DC

## 2015-09-20 MED ORDER — CARVEDILOL 25 MG PO TABS: 12.5000 mg | ORAL_TABLET | Freq: Two times a day (BID) | ORAL | Status: DC

## 2015-09-20 MED ORDER — MECLIZINE HCL 25 MG PO TABS
25.0000 mg | ORAL_TABLET | Freq: Three times a day (TID) | ORAL | Status: DC | PRN
Start: 2015-09-20 — End: 2015-11-25

## 2015-09-20 MED FILL — TRAVEL SICKNESS 25 MG TAB C: 25 | 10 days supply | Qty: 30 | Fill #0

## 2015-09-20 MED FILL — !NOVOLOG 70/30 FLEXPEN: 70-30 | 40 days supply | Qty: 6 | Fill #0

## 2015-09-20 NOTE — Progress Notes (Signed)
Subjective:  Patient ID: [name], female    DOB: 1956/01/06  Age: 60 y.o. MRN: [id number]  CC: Dizziness   HPI [name] has uncontrolled diabetes, HTN, CHF   1. Dizziness: started following severe headache 6 days ago. She had L sided headache. She felt dizzy and unsteady in her gait. She went to the ED the same day and was diagnosed with vertigo.  No neuroimaging done.  She describes dizziness as room spinning with pressure sensation in her head. Her L sideded HA has improved.  He gait has improved, but still unsteady. Her dizziness comes and goes. Better when lying and sitting. However it does come with sitting and with standing. She is using a cane. No falls. She has depression. She did not tolerate cymbalta and valium prescribed at last OV. She denies fever, chills, head trauma. She reports generalized weakness that comes and goes no focal weakness or numbness. She admits to double and blurry vision that comes and goes.   Social History  Substance Use Topics  . Smoking status: Former Smoker    Quit date: 10/25/1980  . Smokeless tobacco: Never Used     Comment: quit smoking in 1982  . Alcohol Use: Yes     Comment: 3 times in last year    Outpatient Prescriptions Prior to Visit  Medication Sig Dispense Refill  . albuterol (PROAIR HFA) 108 (90 BASE) MCG/ACT inhaler Inhale 2 puffs into the lungs every 4 (four) hours as needed for wheezing. 3 Inhaler 3  . albuterol (PROVENTIL) (2.5 MG/3ML) 0.083% nebulizer solution Take 3 mLs (2.5 mg total) by nebulization every 6 (six) hours as needed for wheezing or shortness of breath. 150 mL 1  . amoxicillin (AMOXIL) 500 MG capsule Take 1 capsule (500 mg total) by mouth 3 (three) times daily. 30 capsule 0  . aspirin 325 MG tablet Take 1 tablet (325 mg total) by mouth daily. 30 tablet 3  . atorvastatin (LIPITOR) 20 MG tablet Take 1 tablet (20 mg total) by mouth daily. 30 tablet 3  . Blood Glucose Monitoring Suppl (TRUE METRIX METER) W/DEVICE  KIT 1 each by Does not apply route as needed. 1 kit 0  . carvedilol (COREG) 25 MG tablet Take 1 tablet (25 mg total) by mouth 2 (two) times daily with a meal. 60 tablet 6  . diazepam (VALIUM) 5 MG tablet Take 1 tablet (5 mg total) by mouth every 12 (twelve) hours as needed for anxiety. 60 tablet 0  . diclofenac sodium (VOLTAREN) 1 % GEL Apply 4 g topically 4 (four) times daily. (Patient taking differently: Apply 4 g topically daily as needed (for pain). ) 100 g 2  . DULoxetine (CYMBALTA) 30 MG capsule Take 1 capsule (30 mg total) by mouth daily. 90 capsule 3  . Fluticasone-Salmeterol (ADVAIR DISKUS) 250-50 MCG/DOSE AEPB Inhale 1 puff into the lungs 2 (two) times daily. 60 each 1  . furosemide (LASIX) 40 MG tablet 80 mg at 8 AM and 40 mg 2 PM (Patient taking differently: Take 40-80 mg by mouth 2 (two) times daily. 80 mg at 8 AM and 40 mg 2 PM) 90 tablet 5  . gabapentin (NEURONTIN) 300 MG capsule Take 2 capsules (600 mg total) by mouth 2 (two) times daily. 120 capsule 3  . glucose blood (TRUE METRIX BLOOD GLUCOSE TEST) test strip 1 each by Other route 3 (three) times daily. 100 each 11  . hydrALAZINE (APRESOLINE) 25 MG tablet Take 1 tablet (25 mg total)  by mouth 2 (two) times daily. 60 tablet 5  . insulin aspart protamine - aspart (NOVOLOG MIX 70/30 FLEXPEN) (70-30) 100 UNIT/ML FlexPen Inject 0.1 mLs (10 Units total) into the skin 2 (two) times daily with a meal. 15 mL 5  . Insulin Glargine (LANTUS SOLOSTAR) 100 UNIT/ML Solostar Pen Inject 50 Units into the skin daily at 10 pm. 5 pen 6  . ketoconazole (NIZORAL) 2 % cream Apply 1 application topically daily as needed for irritation.   0  . ketotifen (ZADITOR) 0.025 % ophthalmic solution Place 1 drop into both eyes 2 (two) times daily. (Patient taking differently: Place 1 drop into both eyes daily as needed (for dry eyes). ) 10 mL 1  . lisinopril (PRINIVIL,ZESTRIL) 40 MG tablet TAKE 1 TABLET BY MOUTH DAILY 30 tablet 3  . meclizine (ANTIVERT) 12.5 MG  tablet Take 1 tablet (12.5 mg total) by mouth 3 (three) times daily as needed for dizziness. 30 tablet 0  . ondansetron (ZOFRAN) 8 MG tablet Take 1 tablet (8 mg total) by mouth every 8 (eight) hours as needed for nausea or vomiting. 20 tablet 0  . Potassium Chloride ER 20 MEQ TBCR Take 20 mEq by mouth 2 (two) times daily. With lasix 60 tablet 2  . pregabalin (LYRICA) 100 MG capsule Take 1 capsule (100 mg total) by mouth 3 (three) times daily. 270 capsule 3  . ranitidine (ZANTAC) 150 MG tablet TAKE 1 TABLET (150 MG TOTAL) BY MOUTH 2 (TWO) TIMES DAILY. 60 tablet 2  . sitaGLIPtin (JANUVIA) 50 MG tablet Take 1 tablet (50 mg total) by mouth daily. 90 tablet 3  . valACYclovir (VALTREX) 500 MG tablet TAKE 1 TABLET BY MOUTH DAILY AS NEEDED FOR OUTBREAK 30 tablet 2   No facility-administered medications prior to visit.    ROS Review of Systems  Constitutional: Negative for fever and chills.  Eyes: Positive for visual disturbance.  Respiratory: Negative for shortness of breath.   Cardiovascular: Negative for chest pain.  Gastrointestinal: Positive for nausea. Negative for vomiting, abdominal pain and blood in stool.  Musculoskeletal: Negative for back pain and arthralgias.  Skin: Negative for rash.  Allergic/Immunologic: Negative for immunocompromised state.  Neurological: Positive for dizziness and headaches.  Hematological: Negative for adenopathy. Does not bruise/bleed easily.  Psychiatric/Behavioral: Negative for suicidal ideas and dysphoric mood.    Objective:  BP 137/73 mmHg  Pulse 63  Temp(Src) 98.2 F (36.8 C) (Oral)  Resp 16  Ht 5\' 8"  (1.727 m)  Wt 278 lb (126.1 kg)  BMI 42.28 kg/m2  SpO2 97%  BP/Weight 09/20/2015 09/14/2015 09/05/2015  Systolic BP 137 153 160  Diastolic BP 73 55 80  Wt. (Lbs) 278 276.25 283  BMI 42.28 42.01 43.04   Pulse Readings from Last 3 Encounters:  09/20/15 63  09/14/15 61  09/05/15 60     Physical Exam  Constitutional: She is oriented to person,  place, and time. She appears well-developed and well-nourished. No distress.  HENT:  Head: Normocephalic and atraumatic.  Cardiovascular: Normal rate, regular rhythm, normal heart sounds and intact distal pulses.   Pulmonary/Chest: Effort normal and breath sounds normal.  Musculoskeletal: She exhibits no edema.  Neurological: She is alert and oriented to person, place, and time. She displays normal reflexes. No cranial nerve deficit. She exhibits normal muscle tone. Coordination abnormal.  Skin: Skin is warm and dry. No rash noted.  Psychiatric: She has a normal mood and affect.    Lab Results  Component Value Date   HGBA1C  9.4 09/05/2015   CBG 280 Assessment & Plan:   [place] was seen today for dizziness.  Diagnoses and all orders for this visit:  Uncontrolled type 2 diabetes mellitus with complication, unspecified long term insulin use status (HCC) -     Glucose (CBG) -     Ambulatory referral to Ophthalmology -     insulin aspart protamine - aspart (NOVOLOG MIX 70/30 FLEXPEN) (70-30) 100 UNIT/ML FlexPen; Inject 0.15 mLs (15 Units total) into the skin 2 (two) times daily with a meal.  Acute on chronic diastolic congestive heart failure, NYHA class 2 (HCC) -     carvedilol (COREG) 25 MG tablet; Take 0.5 tablets (12.5 mg total) by mouth 2 (two) times daily with a meal.  Dizzy -     meclizine (ANTIVERT) 25 MG tablet; Take 1 tablet (25 mg total) by mouth 3 (three) times daily as needed for dizziness. -     CT Head Wo Contrast; Future -     CT Maxillofacial WO CM; Future   Follow-up: No Follow-up on file.   Boykin Nearing MD

## 2015-09-20 NOTE — Assessment & Plan Note (Signed)
A; acute dizziness following severe HA. Normal vitals except brady on coreg. Elevated CBG Vertigo likely, r/o sinus infection and stroke P: CT head and CT sinus Refilled meclizine increased to 25 mg daily Opthalmology exam Decrease coreg to 12.5 mg BID from 25  Increase novolog 70/30 to 15 U BID for 10

## 2015-09-20 NOTE — Progress Notes (Signed)
F/U vertigo and lt ear ache  Ear pain and soreness  Pain only with pressure  No tobacco user  No suicidal thoughts in the past two weeks   Elevated glucose today  Pt stated no DM medication today Will take them at home

## 2015-09-20 NOTE — Patient Instructions (Addendum)
[place] was seen today for dizziness.  Diagnoses and all orders for this visit:  Uncontrolled type 2 diabetes mellitus with complication, unspecified long term insulin use status (HCC) -     Glucose (CBG) -     Ambulatory referral to Ophthalmology  Acute on chronic diastolic congestive heart failure, NYHA class 2 (HCC) -     carvedilol (COREG) 25 MG tablet; Take 0.5 tablets (12.5 mg total) by mouth 2 (two) times daily with a meal.  Dizzy -     meclizine (ANTIVERT) 25 MG tablet; Take 1 tablet (25 mg total) by mouth 3 (three) times daily as needed for dizziness. -     CT Head Wo Contrast; Future -     CT Maxillofacial WO CM; Future   Return to ED if headache returns and is severe or you have vomiting or unilateral weakness   F/u in 2 weeks for dizziness   Dr. Adrian Blackwater

## 2015-09-20 NOTE — Progress Notes (Signed)
Depression screen [facility] 2/9 09/20/2015 09/05/2015 04/29/2015 04/08/2015 03/14/2015  Decreased Interest 1 2 1  0 0  Down, Depressed, Hopeless 1 1 1  0 0  PHQ - 2 Score 2 3 2  0 0  Altered sleeping 0 0 1 - -  Tired, decreased energy 3 3 3  - -  Change in appetite 1 1 2  - -  Feeling bad or failure about yourself  1 0 1 - -  Trouble concentrating 3 3 3  - -  Moving slowly or fidgety/restless 1 2 1  - -  Suicidal thoughts 0 0 0 - -  PHQ-9 Score 11 12 13  - -    GAD 7 : Generalized Anxiety Score 09/20/2015 09/05/2015  Nervous, Anxious, on Edge 1 2  Control/stop worrying 2 3  Worry too much - different things 2 3  Trouble relaxing 2 3  Restless 1 2  Easily annoyed or irritable 3 3  Afraid - awful might happen 3 3  Total GAD 7 Score 14 19

## 2015-09-26 ENCOUNTER — Ambulatory Visit (HOSPITAL_COMMUNITY): Admission: RE | Admit: 2015-09-26 | Payer: Medicaid Other | Source: Ambulatory Visit

## 2015-09-26 ENCOUNTER — Ambulatory Visit (HOSPITAL_COMMUNITY)
Admission: RE | Admit: 2015-09-26 | Discharge: 2015-09-26 | Disposition: A | Discharge status: Home / Self Care | Payer: Medicaid Other | Source: Ambulatory Visit | Attending: Family Medicine | Admitting: Family Medicine

## 2015-09-26 ENCOUNTER — Ambulatory Visit
Admission: RE | Admit: 2015-09-26 | Discharge: 2015-09-26 | Disposition: A | Discharge status: Home / Self Care | Payer: Medicaid Other | Source: Ambulatory Visit | Attending: Family Medicine | Admitting: Family Medicine

## 2015-09-26 ENCOUNTER — Ambulatory Visit (HOSPITAL_COMMUNITY)
Admission: RE | Admit: 2015-09-26 | Discharge: 2015-09-26 | Disposition: A | Discharge status: Home / Self Care | Payer: Self-pay | Source: Ambulatory Visit | Attending: Family Medicine | Admitting: Family Medicine

## 2015-09-26 DIAGNOSIS — R42 Dizziness and giddiness: Secondary | ICD-10-CM | POA: Insufficient documentation

## 2015-09-26 DIAGNOSIS — I6523 Occlusion and stenosis of bilateral carotid arteries: Secondary | ICD-10-CM | POA: Insufficient documentation

## 2015-09-26 DIAGNOSIS — J320 Chronic maxillary sinusitis: Secondary | ICD-10-CM | POA: Insufficient documentation

## 2015-09-26 DIAGNOSIS — Z1231 Encounter for screening mammogram for malignant neoplasm of breast: Secondary | ICD-10-CM

## 2015-09-30 MED FILL — JANUVIA 50 MG TABLET: 50 | 30 days supply | Qty: 30 | Fill #2

## 2015-09-30 MED FILL — VALACYCLOVIR HCL 500 MG TAB: 500 | 30 days supply | Qty: 30 | Fill #1

## 2015-09-30 MED FILL — LISINOPRIL 40 MG TABLET: 40 | 30 days supply | Qty: 30 | Fill #3

## 2015-09-30 MED FILL — POTASSIUM CL ER 20 MEQ TAB: 20 | 30 days supply | Qty: 60 | Fill #2

## 2015-09-30 MED FILL — raNITIdine HCL 150 MG TABS: 150 | 30 days supply | Qty: 60 | Fill #3

## 2015-10-01 ENCOUNTER — Other Ambulatory Visit: Payer: Self-pay | Admitting: Family Medicine

## 2015-10-01 ENCOUNTER — Telehealth: Payer: Self-pay | Admitting: *Deleted

## 2015-10-01 DIAGNOSIS — J320 Chronic maxillary sinusitis: Secondary | ICD-10-CM | POA: Insufficient documentation

## 2015-10-01 MED ORDER — FLUTICASONE PROPIONATE 50 MCG/ACT NA SUSP: 2.0000 | Freq: Every day | NASAL | Status: DC

## 2015-10-01 MED ORDER — CLINDAMYCIN HCL 300 MG PO CAPS: 300.0000 mg | ORAL_CAPSULE | Freq: Three times a day (TID) | ORAL | Status: DC

## 2015-10-01 MED FILL — FLUTICASONE PROP 50 MCG SPR: 50 | 30 days supply | Qty: 16 | Fill #0

## 2015-10-01 MED FILL — CLINDAMYCIN HCL 300 MG CAP: 300 | 10 days supply | Qty: 30 | Fill #0

## 2015-10-01 NOTE — Telephone Encounter (Signed)
-----   Message from Boykin Nearing, MD sent at 10/01/2015  9:03 AM EDT ----- Mild chronic R maxillary sinusitis will treat clindamycin for 10 days, also start flonase  Stable L inferior orbit prosthesis

## 2015-10-01 NOTE — Telephone Encounter (Signed)
-----   Message from Boykin Nearing, MD sent at 10/01/2015  9:02 AM EDT ----- Mild chronic R maxillary sinusitis will treat clindamycin for 10 days, also start flonase  Stable L inferior orbit prosthesis

## 2015-10-01 NOTE — Telephone Encounter (Signed)
Date of birth verified by pt  CT results given to pt  Notified Rx send to [facility]  Pt verbalized understanding

## 2015-10-07 ENCOUNTER — Encounter (HOSPITAL_COMMUNITY): Payer: Self-pay | Admitting: *Deleted

## 2015-10-07 ENCOUNTER — Emergency Department (HOSPITAL_COMMUNITY)
Admission: EM | Admit: 2015-10-07 | Discharge: 2015-10-07 | Disposition: A | Discharge status: Home / Self Care | Payer: Medicaid Other | Attending: Emergency Medicine | Admitting: Emergency Medicine

## 2015-10-07 DIAGNOSIS — M62838 Other muscle spasm: Secondary | ICD-10-CM | POA: Insufficient documentation

## 2015-10-07 DIAGNOSIS — Z87891 Personal history of nicotine dependence: Secondary | ICD-10-CM | POA: Insufficient documentation

## 2015-10-07 DIAGNOSIS — J45909 Unspecified asthma, uncomplicated: Secondary | ICD-10-CM | POA: Insufficient documentation

## 2015-10-07 DIAGNOSIS — I509 Heart failure, unspecified: Secondary | ICD-10-CM | POA: Insufficient documentation

## 2015-10-07 DIAGNOSIS — E119 Type 2 diabetes mellitus without complications: Secondary | ICD-10-CM | POA: Insufficient documentation

## 2015-10-07 DIAGNOSIS — Z7982 Long term (current) use of aspirin: Secondary | ICD-10-CM | POA: Insufficient documentation

## 2015-10-07 DIAGNOSIS — Z79899 Other long term (current) drug therapy: Secondary | ICD-10-CM | POA: Insufficient documentation

## 2015-10-07 DIAGNOSIS — Z794 Long term (current) use of insulin: Secondary | ICD-10-CM | POA: Insufficient documentation

## 2015-10-07 DIAGNOSIS — I10 Essential (primary) hypertension: Secondary | ICD-10-CM | POA: Insufficient documentation

## 2015-10-07 DIAGNOSIS — R51 Headache: Secondary | ICD-10-CM | POA: Insufficient documentation

## 2015-10-07 DIAGNOSIS — H9202 Otalgia, left ear: Secondary | ICD-10-CM | POA: Insufficient documentation

## 2015-10-07 HISTORY — DX: Obesity, unspecified: E66.9

## 2015-10-07 MED ORDER — KETOROLAC TROMETHAMINE 30 MG/ML IJ SOLN
60.0000 mg | Freq: Once | INTRAMUSCULAR | Status: AC
  Administered 2015-10-07: 60 mg via INTRAMUSCULAR
  Filled 2015-10-07: qty 2

## 2015-10-07 MED ORDER — CYCLOBENZAPRINE HCL 10 MG PO TABS: 10.0000 mg | ORAL_TABLET | Freq: Two times a day (BID) | ORAL | Status: DC | PRN

## 2015-10-07 MED ORDER — OXYCODONE-ACETAMINOPHEN 5-325 MG PO TABS
1.0000 | ORAL_TABLET | ORAL | Status: DC | PRN
  Administered 2015-10-07: 1 via ORAL
  Filled 2015-10-07: qty 1

## 2015-10-07 MED ORDER — OXYCODONE-ACETAMINOPHEN 5-325 MG PO TABS: 1.0000 | ORAL_TABLET | ORAL | Status: DC | PRN

## 2015-10-07 MED ORDER — OXYCODONE-ACETAMINOPHEN 5-325 MG PO TABS
ORAL_TABLET | ORAL | Status: AC
  Filled 2015-10-07: qty 1

## 2015-10-07 NOTE — ED Provider Notes (Signed)
CSN: [id number]     Arrival date & time 10/07/15  1606 History   First MD Initiated Contact with Patient 10/07/15 2010     Chief Complaint  Patient presents with  . Headache  . Otalgia  . Neck Pain     (Consider location/radiation/quality/duration/timing/severity/associated sxs/prior Treatment) HPI   [name] is a 60 y.o. female presents for evaluation of left neck pain behind her ear. No trauma. She is currently being treated for chronic sinusitis with amoxicillin and fluticasone nasal spray. She denies fever, chills, nausea, vomiting, weakness or paresthesia. He has had some dizziness, which is worse upon standing, but resolves after she is standing for 30 seconds or so. She denies back pain, falling or difficulty walking. There are no other known modifying factors.   Past Medical History  Diagnosis Date  . CHF (congestive heart failure) ([place]) 2011  . Bronchitis   . Diabetes mellitus Dx 1989  . Hypertension Dx 1989  . Ulcer 2010  . Hepatitis C Dx 2013  . Pancreatitis 2013  . Complication of anesthesia   . Refusal of blood transfusions as patient is Jehovah's Witness   . Asthma     As a child   . Obesity    Past Surgical History  Procedure Laterality Date  . Tubal ligation    . Cholecystectomy    . Knee arthroscopy    . Tubal ligation    . Eye surgery     Family History  Problem Relation Age of Onset  . Colon cancer Mother   . Heart attack Other   . Heart attack Maternal Grandmother   . Hypertension Sister   . Hypertension Brother   . Diabetes Paternal Grandmother    Social History  Substance Use Topics  . Smoking status: Former Smoker    Quit date: 10/25/1980  . Smokeless tobacco: Never Used     Comment: quit smoking in 1982  . Alcohol Use: Yes     Comment: 3 times in last year   OB History    No data available     Review of Systems  All other systems reviewed and are negative.     Allergies  Shellfish allergy; Diazepam; and Morphine and  related  Home Medications   Prior to Admission medications   Medication Sig Start Date End Date Taking? Authorizing Provider  albuterol (PROAIR HFA) 108 (90 BASE) MCG/ACT inhaler Inhale 2 puffs into the lungs every 4 (four) hours as needed for wheezing. 04/08/15  Yes Arnoldo Morale, MD  albuterol (PROVENTIL) (2.5 MG/3ML) 0.083% nebulizer solution Take 3 mLs (2.5 mg total) by nebulization every 6 (six) hours as needed for wheezing or shortness of breath. 03/25/15  Yes Arnoldo Morale, MD  aspirin 325 MG tablet Take 1 tablet (325 mg total) by mouth daily. 06/27/15  Yes Josalyn Funches, MD  atorvastatin (LIPITOR) 20 MG tablet Take 1 tablet (20 mg total) by mouth daily. 01/24/15  Yes Josalyn Funches, MD  carvedilol (COREG) 25 MG tablet Take 0.5 tablets (12.5 mg total) by mouth 2 (two) times daily with a meal. 09/20/15  Yes Josalyn Funches, MD  clindamycin (CLEOCIN) 300 MG capsule Take 1 capsule (300 mg total) by mouth 3 (three) times daily. 10/01/15  Yes Josalyn Funches, MD  diclofenac sodium (VOLTAREN) 1 % GEL Apply 4 g topically 4 (four) times daily. Patient taking differently: Apply 4 g topically daily as needed (for pain).  06/27/15  Yes Josalyn Funches, MD  fluticasone (FLONASE) 50 MCG/ACT nasal spray  Place 2 sprays into both nostrils daily. Patient taking differently: Place 2 sprays into both nostrils daily as needed for allergies.  10/01/15  Yes Josalyn Funches, MD  furosemide (LASIX) 40 MG tablet 80 mg at 8 AM and 40 mg 2 PM Patient taking differently: Take 40-80 mg by mouth 2 (two) times daily. 80 mg at 8 AM and 40 mg 2 PM 09/05/15  Yes Josalyn Funches, MD  hydrALAZINE (APRESOLINE) 25 MG tablet Take 1 tablet (25 mg total) by mouth 2 (two) times daily. Patient taking differently: Take 25 mg by mouth 3 (three) times daily.  05/17/15  Yes Josalyn Funches, MD  insulin aspart protamine - aspart (NOVOLOG MIX 70/30 FLEXPEN) (70-30) 100 UNIT/ML FlexPen Inject 0.15 mLs (15 Units total) into the skin 2 (two) times  daily with a meal. 09/20/15  Yes Josalyn Funches, MD  Insulin Glargine (LANTUS SOLOSTAR) 100 UNIT/ML Solostar Pen Inject 50 Units into the skin daily at 10 pm. 03/25/15  Yes Arnoldo Morale, MD  ketotifen (ZADITOR) 0.025 % ophthalmic solution Place 1 drop into both eyes 2 (two) times daily. Patient taking differently: Place 1 drop into both eyes daily as needed (for dry eyes).  03/14/15  Yes Josalyn Funches, MD  lisinopril (PRINIVIL,ZESTRIL) 40 MG tablet TAKE 1 TABLET BY MOUTH DAILY 06/18/15  Yes Josalyn Funches, MD  ondansetron (ZOFRAN) 8 MG tablet Take 1 tablet (8 mg total) by mouth every 8 (eight) hours as needed for nausea or vomiting. 09/17/15  Yes Josalyn Funches, MD  Potassium Chloride ER 20 MEQ TBCR Take 20 mEq by mouth 2 (two) times daily. With lasix 06/27/15  Yes Josalyn Funches, MD  pregabalin (LYRICA) 100 MG capsule Take 1 capsule (100 mg total) by mouth 3 (three) times daily. 09/05/15  Yes Josalyn Funches, MD  ranitidine (ZANTAC) 150 MG tablet TAKE 1 TABLET (150 MG TOTAL) BY MOUTH 2 (TWO) TIMES DAILY. 06/18/15  Yes Josalyn Funches, MD  sitaGLIPtin (JANUVIA) 50 MG tablet Take 1 tablet (50 mg total) by mouth daily. 07/02/15  Yes Josalyn Funches, MD  valACYclovir (VALTREX) 500 MG tablet TAKE 1 TABLET BY MOUTH DAILY AS NEEDED FOR OUTBREAK 08/30/15  Yes Josalyn Funches, MD  amoxicillin (AMOXIL) 500 MG capsule Take 1 capsule (500 mg total) by mouth 3 (three) times daily. 09/19/15   Josalyn Funches, MD  Blood Glucose Monitoring Suppl (TRUE METRIX METER) W/DEVICE KIT 1 each by Does not apply route as needed. 05/20/15   Josalyn Funches, MD  Fluticasone-Salmeterol (ADVAIR DISKUS) 250-50 MCG/DOSE AEPB Inhale 1 puff into the lungs 2 (two) times daily. 03/25/15   Arnoldo Morale, MD  glucose blood (TRUE METRIX BLOOD GLUCOSE TEST) test strip 1 each by Other route 3 (three) times daily. 05/20/15   Boykin Nearing, MD  meclizine (ANTIVERT) 25 MG tablet Take 1 tablet (25 mg total) by mouth 3 (three) times daily as needed for  dizziness. 09/20/15   Josalyn Funches, MD   BP 125/99 mmHg  Pulse 62  Temp(Src) 98.1 F (36.7 C) (Oral)  Resp 16  SpO2 99% Physical Exam  Constitutional: She is oriented to person, place, and time. She appears well-developed. No distress.  Morbidly obese  HENT:  Head: Normocephalic and atraumatic.  Right Ear: External ear normal.  Left Ear: External ear normal.  Eyes: Conjunctivae and EOM are normal. Pupils are equal, round, and reactive to light.  Neck: Normal range of motion and phonation normal. Neck supple.  Cardiovascular: Normal rate, regular rhythm and normal heart sounds.   Pulmonary/Chest: Effort normal  and breath sounds normal. She exhibits no bony tenderness.  Abdominal: Soft. There is no tenderness.  Musculoskeletal: Normal range of motion.  Tender left proximal sternocleidal mastoid at insertion on skull. Decreased range of motion, left lateral bending, and rightward motion of the neck secondary to pain in the left SCM.  Neurological: She is alert and oriented to person, place, and time. No cranial nerve deficit or sensory deficit. She exhibits normal muscle tone. Coordination normal.  No dysarthria, aphasia or nystagmus. Normal gait.  Skin: Skin is warm, dry and intact.  Psychiatric: She has a normal mood and affect. Her behavior is normal. Judgment and thought content normal.  Nursing note and vitals reviewed.   ED Course  Procedures (including critical care time) Labs Review Labs Reviewed - No data to display  Imaging Review No results found. I have personally reviewed and evaluated these images and lab results as part of my medical decision-making.   EKG Interpretation None      MDM   Final diagnoses:  Muscle spasms of neck    Left neck pain and headache related to spasm of the left SCM. Doubt intracranial bleeding, sepsis, severe sinusitis.  Nursing Notes Reviewed/ Care Coordinated Applicable Imaging Reviewed Interpretation of Laboratory Data  incorporated into ED treatment  The patient appears reasonably screened and/or stabilized for discharge and I doubt any other medical condition or other [facility] requiring further screening, evaluation, or treatment in the ED at this time prior to discharge.  Plan: Home Medications- Valium. Percocet; Home Treatments- heat to affected area; return here if the recommended treatment, does not improve the symptoms; Recommended follow up- PCP 3 days for check up     Daleen Bo, MD 10/08/15 2358

## 2015-10-07 NOTE — Discharge Instructions (Signed)
°  Use heat on the sore area 3 or 4 times a day. Follow-up with your primary care doctor as soon as possible for a checkup.   Heat Therapy Heat therapy can help ease sore, stiff, injured, and tight muscles and joints. Heat relaxes your muscles, which may help ease your pain.  RISKS AND COMPLICATIONS If you have any of the following conditions, do not use heat therapy unless your health care provider has approved:  Poor circulation.  Healing wounds or scarred skin in the area being treated.  Diabetes, heart disease, or high blood pressure.  Not being able to feel (numbness) the area being treated.  Unusual swelling of the area being treated.  Active infections.  Blood clots.  Cancer.  Inability to communicate pain. This may include young children and people who have problems with their brain function (dementia).  Pregnancy. Heat therapy should only be used on old, pre-existing, or long-lasting (chronic) injuries. Do not use heat therapy on new injuries unless directed by your health care provider. HOW TO USE HEAT THERAPY There are several different kinds of heat therapy, including:  Moist heat pack.  Warm water bath.  Hot water bottle.  Electric heating pad.  Heated gel pack.  Heated wrap.  Electric heating pad. Use the heat therapy method suggested by your health care provider. Follow your health care provider's instructions on when and how to use heat therapy. GENERAL HEAT THERAPY RECOMMENDATIONS  Do not sleep while using heat therapy. Only use heat therapy while you are awake.  Your skin may turn pink while using heat therapy. Do not use heat therapy if your skin turns red.  Do not use heat therapy if you have new pain.  High heat or long exposure to heat can cause burns. Be careful when using heat therapy to avoid burning your skin.  Do not use heat therapy on areas of your skin that are already irritated, such as with a rash or sunburn. SEEK MEDICAL CARE  IF:  You have blisters, redness, swelling, or numbness.  You have new pain.  Your pain is worse. MAKE SURE YOU:  Understand these instructions.  Will watch your condition.  Will get help right away if you are not doing well or get worse.   This information is not intended to replace advice given to you by your health care provider. Make sure you discuss any questions you have with your health care provider.   Document Released: 09/21/2011 Document Revised: 07/20/2014 Document Reviewed: 08/22/2013 Elsevier Interactive Patient Education [employer].

## 2015-10-07 NOTE — ED Notes (Signed)
Pt given ginger ale and graham crackers.

## 2015-10-07 NOTE — ED Notes (Signed)
Pt reports onset of severe headache. Also reports pain to left ear and has a knot behind her ear, pain radiates into her neck. Airway intact.

## 2015-10-08 MED FILL — $LANTUS SOLOSTAR 100 UNITS/: 100 | 30 days supply | Qty: 15 | Fill #2

## 2015-10-10 MED FILL — ATORVASTATIN 20 MG TABLET: 20 | 30 days supply | Qty: 30 | Fill #0

## 2015-10-21 MED FILL — hydrOXYzine HCL 25 MG TABS: 25 | 10 days supply | Qty: 30 | Fill #1

## 2015-10-21 MED FILL — hydrALAZINE HCL 25 MG TABS: 25 | 30 days supply | Qty: 60 | Fill #1

## 2015-10-23 MED FILL — !NOVOLOG 70/30 FLEXPEN: 70-30 | 30 days supply | Qty: 6 | Fill #1

## 2015-10-29 MED FILL — FLUTICASONE PROP 50 MCG SPR: 50 | 30 days supply | Qty: 16 | Fill #1

## 2015-10-29 MED FILL — $LANTUS SOLOSTAR 100 UNITS/: 100 | 30 days supply | Qty: 15 | Fill #3

## 2015-11-01 ENCOUNTER — Other Ambulatory Visit: Payer: Self-pay | Admitting: Family Medicine

## 2015-11-01 MED FILL — ?ATORVASTATIN 20 MG TABLET: 20 | 30 days supply | Qty: 30 | Fill #1

## 2015-11-01 MED FILL — JANUVIA 50 MG TABLET: 50 | 30 days supply | Qty: 30 | Fill #3

## 2015-11-01 MED FILL — CARVEDILOL 25 MG TABLET: 25 | 30 days supply | Qty: 60 | Fill #3

## 2015-11-01 MED FILL — ?FUROSEMIDE 40 MG TABLET: 40 | 30 days supply | Qty: 90 | Fill #1

## 2015-11-01 MED FILL — VALACYCLOVIR HCL 500 MG TAB: 500 | 30 days supply | Qty: 30 | Fill #2

## 2015-11-04 MED FILL — raNITIdine HCL 150 MG TABS: 150 | 30 days supply | Qty: 60 | Fill #0

## 2015-11-04 MED FILL — LISINOPRIL 40 MG TABLET: 40 | 30 days supply | Qty: 30 | Fill #0

## 2015-11-11 MED FILL — !NOVOLOG 70/30 FLEXPEN: 70-30 | 30 days supply | Qty: 6 | Fill #2

## 2015-11-12 ENCOUNTER — Other Ambulatory Visit: Payer: Self-pay | Admitting: Family Medicine

## 2015-11-12 DIAGNOSIS — I5032 Chronic diastolic (congestive) heart failure: Secondary | ICD-10-CM

## 2015-11-12 MED FILL — POTASSIUM CL ER 20 MEQ TAB: 20 | 30 days supply | Qty: 60 | Fill #0

## 2015-11-12 MED FILL — hydrOXYzine HCL 25 MG TABS: 25 | 10 days supply | Qty: 30 | Fill #2

## 2015-11-18 ENCOUNTER — Telehealth: Payer: Self-pay | Admitting: Family Medicine

## 2015-11-18 ENCOUNTER — Other Ambulatory Visit: Payer: Self-pay | Admitting: *Deleted

## 2015-11-18 DIAGNOSIS — E1165 Type 2 diabetes mellitus with hyperglycemia: Secondary | ICD-10-CM

## 2015-11-18 DIAGNOSIS — E118 Type 2 diabetes mellitus with unspecified complications: Principal | ICD-10-CM

## 2015-11-18 MED ORDER — SITAGLIPTIN PHOSPHATE 50 MG PO TABS: 50.0000 mg | ORAL_TABLET | Freq: Every day | ORAL | Status: DC

## 2015-11-18 MED FILL — !VENTOLIN HFA INHALER: 108 (90 BAS | 10 days supply | Qty: 18 | Fill #2

## 2015-11-18 NOTE — Telephone Encounter (Signed)
Pt. Came into facility requesting a refill on sitaGLIPtin (JANUVIA) 50 MG tablet. Pt. Stated if her PCP could put a higher does on the medication because her blood sugar has been in the 300 to 400. Please f/u with pt.

## 2015-11-18 NOTE — Telephone Encounter (Signed)
Rx refills send to [facility]  

## 2015-11-20 ENCOUNTER — Other Ambulatory Visit: Payer: Self-pay | Admitting: *Deleted

## 2015-11-20 DIAGNOSIS — E1165 Type 2 diabetes mellitus with hyperglycemia: Secondary | ICD-10-CM

## 2015-11-20 DIAGNOSIS — E118 Type 2 diabetes mellitus with unspecified complications: Principal | ICD-10-CM

## 2015-11-20 MED ORDER — SITAGLIPTIN PHOSPHATE 50 MG PO TABS: 50.0000 mg | ORAL_TABLET | Freq: Every day | ORAL | Status: DC

## 2015-11-25 ENCOUNTER — Encounter: Payer: Self-pay | Admitting: Family Medicine

## 2015-11-25 ENCOUNTER — Ambulatory Visit: Payer: Self-pay | Attending: Family Medicine | Admitting: Family Medicine

## 2015-11-25 VITALS — BP 145/78 | HR 61 | Temp 98.4°F | Resp 18 | Ht 68.0 in | Wt 289.0 lb

## 2015-11-25 DIAGNOSIS — Z79899 Other long term (current) drug therapy: Secondary | ICD-10-CM | POA: Insufficient documentation

## 2015-11-25 DIAGNOSIS — I10 Essential (primary) hypertension: Secondary | ICD-10-CM

## 2015-11-25 DIAGNOSIS — E118 Type 2 diabetes mellitus with unspecified complications: Secondary | ICD-10-CM | POA: Insufficient documentation

## 2015-11-25 DIAGNOSIS — Z794 Long term (current) use of insulin: Secondary | ICD-10-CM | POA: Insufficient documentation

## 2015-11-25 DIAGNOSIS — J45909 Unspecified asthma, uncomplicated: Secondary | ICD-10-CM

## 2015-11-25 DIAGNOSIS — Z9109 Other allergy status, other than to drugs and biological substances: Secondary | ICD-10-CM

## 2015-11-25 DIAGNOSIS — M65339 Trigger finger, unspecified middle finger: Secondary | ICD-10-CM | POA: Insufficient documentation

## 2015-11-25 DIAGNOSIS — E1165 Type 2 diabetes mellitus with hyperglycemia: Secondary | ICD-10-CM

## 2015-11-25 DIAGNOSIS — Z91048 Other nonmedicinal substance allergy status: Secondary | ICD-10-CM

## 2015-11-25 DIAGNOSIS — E1142 Type 2 diabetes mellitus with diabetic polyneuropathy: Secondary | ICD-10-CM

## 2015-11-25 DIAGNOSIS — M65332 Trigger finger, left middle finger: Secondary | ICD-10-CM

## 2015-11-25 DIAGNOSIS — Z7982 Long term (current) use of aspirin: Secondary | ICD-10-CM | POA: Insufficient documentation

## 2015-11-25 LAB — GLUCOSE, POCT (MANUAL RESULT ENTRY): POC Glucose: 258 mg/dl — AB (ref 70–99)

## 2015-11-25 LAB — BASIC METABOLIC PANEL WITH GFR
BUN: 58 mg/dL — ABNORMAL HIGH (ref 7–25)
CO2: 22 mmol/L (ref 20–31)
Calcium: 8.6 mg/dL (ref 8.6–10.4)
Chloride: 106 mmol/L (ref 98–110)
Creat: 1.75 mg/dL — ABNORMAL HIGH (ref 0.50–1.05)
GFR, Est African American: 37 mL/min — ABNORMAL LOW (ref >=60)
GFR, Est Non African American: 32 mL/min — ABNORMAL LOW (ref >=60)
Glucose, Bld: 264 mg/dL — ABNORMAL HIGH (ref 65–99)
Potassium: 4.8 mmol/L (ref 3.5–5.3)
Sodium: 138 mmol/L (ref 135–146)

## 2015-11-25 LAB — LIPID PANEL
Cholesterol: 141 mg/dL (ref 125–200)
HDL: 45 mg/dL — ABNORMAL LOW (ref >=46)
LDL Cholesterol: 66 mg/dL (ref <130)
Total CHOL/HDL Ratio: 3.1 Ratio (ref <=5.0)
Triglycerides: 150 mg/dL — ABNORMAL HIGH (ref <150)
VLDL: 30 mg/dL (ref <30)

## 2015-11-25 MED ORDER — HYDRALAZINE HCL 25 MG PO TABS: 25.0000 mg | ORAL_TABLET | Freq: Three times a day (TID) | ORAL | Status: DC

## 2015-11-25 MED ORDER — ACETAMINOPHEN-CODEINE #3 300-30 MG PO TABS: 1.0000 | ORAL_TABLET | Freq: Three times a day (TID) | ORAL | Status: DC | PRN

## 2015-11-25 MED ORDER — GLUCOSE BLOOD VI STRP: 1.0000 | ORAL_STRIP | Freq: Three times a day (TID) | Status: DC

## 2015-11-25 MED ORDER — LISINOPRIL 40 MG PO TABS: 40.0000 mg | ORAL_TABLET | Freq: Every day | ORAL | Status: DC

## 2015-11-25 MED ORDER — DICLOFENAC SODIUM 1 % TD GEL: 4.0000 g | Freq: Every day | TRANSDERMAL | Status: DC | PRN

## 2015-11-25 MED ORDER — HYDROXYZINE HCL 50 MG PO TABS: 50.0000 mg | ORAL_TABLET | Freq: Three times a day (TID) | ORAL | Status: DC | PRN

## 2015-11-25 MED ORDER — TRUE METRIX METER W/DEVICE KIT: 1.0000 | PACK | Status: DC | PRN

## 2015-11-25 MED ORDER — INSULIN PEN NEEDLE 31G X 8 MM MISC: 1.0000 | Freq: Four times a day (QID) | Status: DC

## 2015-11-25 MED ORDER — INSULIN GLARGINE 100 UNIT/ML SOLOSTAR PEN: 50.0000 [IU] | PEN_INJECTOR | Freq: Every day | SUBCUTANEOUS | Status: DC

## 2015-11-25 MED ORDER — EXENATIDE 5 MCG/0.02ML ~~LOC~~ SOPN: 5.0000 ug | PEN_INJECTOR | Freq: Two times a day (BID) | SUBCUTANEOUS | Status: DC

## 2015-11-25 MED FILL — !TRUE METRIX BLOOD GLUCOSE: 1 days supply | Qty: 1 | Fill #0

## 2015-11-25 MED FILL — LISINOPRIL 40 MG TABLET: 40 | 30 days supply | Qty: 30 | Fill #0

## 2015-11-25 MED FILL — ACETAMINOPHEN/COD #3 TABLET: 300-30 | 20 days supply | Qty: 60 | Fill #0

## 2015-11-25 MED FILL — $LANTUS SOLOSTAR 100 UNITS/: 100 | 30 days supply | Qty: 15 | Fill #0

## 2015-11-25 MED FILL — hydrALAZINE HCL 25 MG TABS: 25 | 30 days supply | Qty: 90 | Fill #0

## 2015-11-25 MED FILL — !BYETTA 5 MCG DOSE PEN INJ: 5 | 30 days supply | Qty: 1 | Fill #0

## 2015-11-25 MED FILL — hydrOXYzine HCL 50 MG TABS: 50 | 10 days supply | Qty: 30 | Fill #0

## 2015-11-25 NOTE — Assessment & Plan Note (Signed)
A: chronic diabetes with hyperglycemia and worsening neuropathy. Patient gaining weight. Not exercising.  Med: compliant P: Add victoza 5 mcg BID Continue lantus and 70/30 insulin Continue januvia 50 mg daily Patient not a candidate for metformin due to GFR < 45 on last check

## 2015-11-25 NOTE — Patient Instructions (Addendum)
[name] was seen today for numbness.  Diagnoses and all orders for this visit:  Uncontrolled type 2 diabetes mellitus with complication, unspecified long term insulin use status (HCC) -     POCT glucose (manual entry) -     Hemoglobin A1c -     BASIC METABOLIC PANEL WITH GFR -     exenatide (BYETTA 5 MCG PEN) 5 MCG/0.02ML SOPN injection; Inject 0.02 mLs (5 mcg total) into the skin 2 (two) times daily with a meal. -     glucose blood (TRUE METRIX BLOOD GLUCOSE TEST) test strip; 1 each by Other route 3 (three) times daily. -     Insulin Glargine (LANTUS SOLOSTAR) 100 UNIT/ML Solostar Pen; Inject 50 Units into the skin daily at 10 pm. -     Blood Glucose Monitoring Suppl (TRUE METRIX METER) w/Device KIT; 1 each by Does not apply route as needed. -     Insulin Pen Needle (PX SHORTLENGTH PEN NEEDLES) 31G X 8 MM MISC; 1 each by Does not apply route 4 (four) times daily.  Essential hypertension -     hydrALAZINE (APRESOLINE) 25 MG tablet; Take 1 tablet (25 mg total) by mouth 3 (three) times daily. -     lisinopril (PRINIVIL,ZESTRIL) 40 MG tablet; Take 1 tablet (40 mg total) by mouth daily. -     BASIC METABOLIC PANEL WITH GFR  Diabetic polyneuropathy associated with type 2 diabetes mellitus (HCC) -     acetaminophen-codeine (TYLENOL #3) 300-30 MG tablet; Take 1 tablet by mouth every 8 (eight) hours as needed.  Environmental allergies -     hydrOXYzine (ATARAX/VISTARIL) 50 MG tablet; Take 1 tablet (50 mg total) by mouth 3 (three) times daily as needed.  Trigger middle finger, left -     diclofenac sodium (VOLTAREN) 1 % GEL; Apply 4 g topically daily as needed (for pain).    F/u in 4 weeks for diabetes  Dr. Adrian Blackwater

## 2015-11-25 NOTE — Progress Notes (Signed)
C/C numbness on Rt fingers, Lt 3rd finger pain.  Pain on feet  No pain today  No tobacco user  No suicidal thoughts in the past two weeks

## 2015-11-25 NOTE — Progress Notes (Signed)
Subjective:  Patient ID: [name], female    DOB: 09-21-1955  Age: 60 y.o. MRN: [id number]  CC: Numbness   HPI Emmakate MACAELA PRESAS has diabetes, HTN, CKD stage 3, obesity, diastolic CHF, OSA  presents for    1. CHRONIC DIABETES  Disease Monitoring  Blood Sugar Ranges: 258-450   Polyuria: yes, but also taking lasix   Visual problems: yes   Medication Compliance: yes  Medication Side Effects  Hypoglycemia: no   Preventitive Health Care  Eye Exam: due, uninsured and has not applied for   Foot Exam: done today   Diet pattern: overeating   Exercise: none, she needs a more detailed letter for her gym in order to be allowed to go back.   2. Diabetic neuropathy: worsening pain in fingers and toes of both hands. Pains radiates to shins on R side. Taking lyrica. Also with pain in stiffness in L middle finger with intermittent locking and popping. No trauma to L hand.   Social History  Substance Use Topics  . Smoking status: Former Smoker    Quit date: 10/25/1980  . Smokeless tobacco: Never Used     Comment: quit smoking in 1982  . Alcohol Use: Yes     Comment: 3 times in last year    Outpatient Prescriptions Prior to Visit  Medication Sig Dispense Refill  . albuterol (PROAIR HFA) 108 (90 BASE) MCG/ACT inhaler Inhale 2 puffs into the lungs every 4 (four) hours as needed for wheezing. 3 Inhaler 3  . albuterol (PROVENTIL) (2.5 MG/3ML) 0.083% nebulizer solution Take 3 mLs (2.5 mg total) by nebulization every 6 (six) hours as needed for wheezing or shortness of breath. 150 mL 1  . aspirin 325 MG tablet Take 1 tablet (325 mg total) by mouth daily. 30 tablet 3  . atorvastatin (LIPITOR) 20 MG tablet Take 1 tablet (20 mg total) by mouth daily. 30 tablet 3  . Blood Glucose Monitoring Suppl (TRUE METRIX METER) W/DEVICE KIT 1 each by Does not apply route as needed. 1 kit 0  . carvedilol (COREG) 25 MG tablet Take 0.5 tablets (12.5 mg total) by mouth 2 (two) times daily with a meal. 60 tablet  6  . cyclobenzaprine (FLEXERIL) 10 MG tablet Take 1 tablet (10 mg total) by mouth 2 (two) times daily as needed for muscle spasms. 20 tablet 0  . diclofenac sodium (VOLTAREN) 1 % GEL Apply 4 g topically 4 (four) times daily. (Patient taking differently: Apply 4 g topically daily as needed (for pain). ) 100 g 2  . fluticasone (FLONASE) 50 MCG/ACT nasal spray Place 2 sprays into both nostrils daily. (Patient taking differently: Place 2 sprays into both nostrils daily as needed for allergies. ) 16 g 1  . Fluticasone-Salmeterol (ADVAIR DISKUS) 250-50 MCG/DOSE AEPB Inhale 1 puff into the lungs 2 (two) times daily. 60 each 1  . furosemide (LASIX) 40 MG tablet 80 mg at 8 AM and 40 mg 2 PM (Patient taking differently: Take 40-80 mg by mouth 2 (two) times daily. 80 mg at 8 AM and 40 mg 2 PM) 90 tablet 5  . glucose blood (TRUE METRIX BLOOD GLUCOSE TEST) test strip 1 each by Other route 3 (three) times daily. 100 each 11  . hydrALAZINE (APRESOLINE) 25 MG tablet Take 1 tablet (25 mg total) by mouth 2 (two) times daily. (Patient taking differently: Take 25 mg by mouth 3 (three) times daily. ) 60 tablet 5  . insulin aspart protamine - aspart (NOVOLOG MIX  70/30 FLEXPEN) (70-30) 100 UNIT/ML FlexPen Inject 0.15 mLs (15 Units total) into the skin 2 (two) times daily with a meal. 15 mL 5  . Insulin Glargine (LANTUS SOLOSTAR) 100 UNIT/ML Solostar Pen Inject 50 Units into the skin daily at 10 pm. 5 pen 6  . ketotifen (ZADITOR) 0.025 % ophthalmic solution Place 1 drop into both eyes 2 (two) times daily. (Patient taking differently: Place 1 drop into both eyes daily as needed (for dry eyes). ) 10 mL 1  . lisinopril (PRINIVIL,ZESTRIL) 40 MG tablet TAKE 1 TABLET BY MOUTH DAILY 30 tablet 3  . meclizine (ANTIVERT) 25 MG tablet Take 1 tablet (25 mg total) by mouth 3 (three) times daily as needed for dizziness. 30 tablet 0  . ondansetron (ZOFRAN) 8 MG tablet Take 1 tablet (8 mg total) by mouth every 8 (eight) hours as needed for  nausea or vomiting. 20 tablet 0  . potassium chloride SA (K-DUR,KLOR-CON) 20 MEQ tablet TAKE 1 TABLET BY MOUTH TWICE DAILY WITH LASIX 60 tablet 2  . pregabalin (LYRICA) 100 MG capsule Take 1 capsule (100 mg total) by mouth 3 (three) times daily. 270 capsule 3  . ranitidine (ZANTAC) 150 MG tablet TAKE 1 TABLET BY MOUTH 2 TIMES DAILY. 60 tablet 3  . sitaGLIPtin (JANUVIA) 50 MG tablet Take 1 tablet (50 mg total) by mouth daily. 90 tablet 3  . valACYclovir (VALTREX) 500 MG tablet TAKE 1 TABLET BY MOUTH DAILY AS NEEDED FOR OUTBREAK 30 tablet 2  . oxyCODONE-acetaminophen (PERCOCET) 5-325 MG tablet Take 1 tablet by mouth every 4 (four) hours as needed. (Patient not taking: Reported on 11/25/2015) 10 tablet 0  . amoxicillin (AMOXIL) 500 MG capsule Take 1 capsule (500 mg total) by mouth 3 (three) times daily. 30 capsule 0  . clindamycin (CLEOCIN) 300 MG capsule Take 1 capsule (300 mg total) by mouth 3 (three) times daily. 30 capsule 0  . sitaGLIPtin (JANUVIA) 50 MG tablet Take 1 tablet (50 mg total) by mouth daily. 90 tablet 3   No facility-administered medications prior to visit.    ROS Review of Systems  Constitutional: Negative for fever and chills.  Eyes: Positive for visual disturbance.  Respiratory: Negative for shortness of breath.   Cardiovascular: Negative for chest pain.  Gastrointestinal: Negative for nausea, vomiting, abdominal pain and blood in stool.  Musculoskeletal: Negative for back pain and arthralgias.  Skin: Negative for rash.  Allergic/Immunologic: Negative for immunocompromised state.  Neurological: Positive for numbness (and tingling in hands and feet ). Negative for dizziness and headaches.  Hematological: Negative for adenopathy. Does not bruise/bleed easily.  Psychiatric/Behavioral: Negative for suicidal ideas and dysphoric mood.    Objective:  BP 145/78 mmHg  Pulse 61  Temp(Src) 98.4 F (36.9 C) (Oral)  Resp 18  Ht _0  (1.727 m)  Wt 289 lb (131.09 kg)  BMI  43.95 kg/m2  SpO2 96%  BP/Weight 11/25/2015 10/07/2015 1/61/0960  Systolic BP 454 098 119  Diastolic BP 78 85 73  Wt. (Lbs) 289 - 278  BMI 43.95 - 42.28   Physical Exam  Constitutional: She is oriented to person, place, and time. She appears well-developed and well-nourished. No distress.  HENT:  Head: Normocephalic and atraumatic.  Cardiovascular: Normal rate, regular rhythm, normal heart sounds and intact distal pulses.   Pulmonary/Chest: Effort normal and breath sounds normal.  Musculoskeletal: She exhibits no edema.  Neurological: She is alert and oriented to person, place, and time. She displays normal reflexes. No cranial nerve deficit. She  exhibits normal muscle tone. Coordination normal.  Skin: Skin is warm and dry. No rash noted.  Psychiatric: She has a normal mood and affect.   Lab Results  Component Value Date   HGBA1C 9.4 09/05/2015   CBG 258  Assessment & Plan:   Walda was seen today for numbness.  Diagnoses and all orders for this visit:  Uncontrolled type 2 diabetes mellitus with complication, unspecified long term insulin use status (HCC) -     POCT glucose (manual entry) -     Hemoglobin A1c -     BASIC METABOLIC PANEL WITH GFR -     exenatide (BYETTA 5 MCG PEN) 5 MCG/0.02ML SOPN injection; Inject 0.02 mLs (5 mcg total) into the skin 2 (two) times daily with a meal. -     glucose blood (TRUE METRIX BLOOD GLUCOSE TEST) test strip; 1 each by Other route 3 (three) times daily. -     Insulin Glargine (LANTUS SOLOSTAR) 100 UNIT/ML Solostar Pen; Inject 50 Units into the skin daily at 10 pm. -     Blood Glucose Monitoring Suppl (TRUE METRIX METER) w/Device KIT; 1 each by Does not apply route as needed. -     Insulin Pen Needle (PX SHORTLENGTH PEN NEEDLES) 31G X 8 MM MISC; 1 each by Does not apply route 4 (four) times daily. -     Lipid Panel  Essential hypertension -     hydrALAZINE (APRESOLINE) 25 MG tablet; Take 1 tablet (25 mg total) by mouth 3 (three) times  daily. -     lisinopril (PRINIVIL,ZESTRIL) 40 MG tablet; Take 1 tablet (40 mg total) by mouth daily. -     BASIC METABOLIC PANEL WITH GFR  Diabetic polyneuropathy associated with type 2 diabetes mellitus (HCC) -     acetaminophen-codeine (TYLENOL #3) 300-30 MG tablet; Take 1 tablet by mouth every 8 (eight) hours as needed.  Environmental allergies -     hydrOXYzine (ATARAX/VISTARIL) 50 MG tablet; Take 1 tablet (50 mg total) by mouth 3 (three) times daily as needed.  Trigger middle finger, left -     diclofenac sodium (VOLTAREN) 1 % GEL; Apply 4 g topically daily as needed (for pain).    No orders of the defined types were placed in this encounter.    Follow-up: No Follow-up on file.   Boykin Nearing MD

## 2015-11-25 NOTE — Assessment & Plan Note (Signed)
Worsening of diabetic neuropathy with numbness and pain in both hand and feet along with trigger finger of L middle finger  Plan: Diabetes control, blood sugar control Continue lyrica Add tylenol #3, she has tried tramadol in the past with no relief  Topical NSAID for L 3rd trigger finger

## 2015-11-26 LAB — HEMOGLOBIN A1C

## 2015-11-26 MED FILL — TRUE METRIX TEST STRIP: 30 days supply | Qty: 100 | Fill #0

## 2015-11-27 ENCOUNTER — Telehealth: Payer: Self-pay | Admitting: Family Medicine

## 2015-11-27 NOTE — Telephone Encounter (Signed)
Pt. Came into facility stating that her nurse would have a letter ready for her. Please f/u with pt.

## 2015-11-29 ENCOUNTER — Encounter: Payer: Self-pay | Admitting: Family Medicine

## 2015-11-29 DIAGNOSIS — J45909 Unspecified asthma, uncomplicated: Secondary | ICD-10-CM | POA: Insufficient documentation

## 2015-12-03 MED FILL — VALACYCLOVIR HCL 500 MG TAB: 500 | 30 days supply | Qty: 30 | Fill #0

## 2015-12-03 MED FILL — FUROSEMIDE 40 MG TABLET: 40 | 30 days supply | Qty: 90 | Fill #2

## 2015-12-03 MED FILL — CARVEDILOL 25 MG TABLET: 25 | 30 days supply | Qty: 60 | Fill #4

## 2015-12-03 MED FILL — ATORVASTATIN 20 MG TABLET: 20 | 30 days supply | Qty: 30 | Fill #2

## 2015-12-04 MED FILL — $JANUVIA 50 MG TABLET: 50 | 30 days supply | Qty: 30 | Fill #0

## 2015-12-11 ENCOUNTER — Telehealth: Payer: Self-pay | Admitting: *Deleted

## 2015-12-11 DIAGNOSIS — E1165 Type 2 diabetes mellitus with hyperglycemia: Secondary | ICD-10-CM

## 2015-12-11 DIAGNOSIS — E118 Type 2 diabetes mellitus with unspecified complications: Principal | ICD-10-CM

## 2015-12-11 NOTE — Telephone Encounter (Signed)
Pt stated will be here tomorrow for A1c recheck   Requesting Rx Novolog to be change.  Pharmacy will not cover the cost

## 2015-12-11 NOTE — Telephone Encounter (Signed)
-----   Message from Boykin Nearing, MD sent at 11/26/2015  3:03 PM EDT ----- A1c did not result Please f/u for repeat check, we should have POC A1c in soon so it can be a fingerstick Continue current care plan

## 2015-12-11 NOTE — Telephone Encounter (Signed)
LVM to return call.

## 2015-12-12 ENCOUNTER — Ambulatory Visit: Payer: Self-pay | Attending: Family Medicine

## 2015-12-12 DIAGNOSIS — E119 Type 2 diabetes mellitus without complications: Secondary | ICD-10-CM | POA: Insufficient documentation

## 2015-12-12 LAB — POCT GLYCOSYLATED HEMOGLOBIN (HGB A1C): Hemoglobin A1C: 11

## 2015-12-12 MED ORDER — INSULIN ASPART 100 UNIT/ML FLEXPEN: PEN_INJECTOR | SUBCUTANEOUS | Status: DC

## 2015-12-12 MED FILL — !NOVOLOG FLEXPEN SYRINGE 1: 100/ML | 15 days supply | Qty: 15 | Fill #0

## 2015-12-12 NOTE — Telephone Encounter (Signed)
Changed from novolog 70/30 to regular novolog 5 U TID for one week, then 10 U TID as long as she is not getting low sugars Repeat A1c should be a POCT

## 2015-12-26 MED FILL — POTASSIUM CL ER 20 MEQ TAB: 20 | 30 days supply | Qty: 60 | Fill #1

## 2015-12-26 MED FILL — hydrOXYzine HCL 50 MG TABS: 50 | 10 days supply | Qty: 30 | Fill #1

## 2015-12-26 MED FILL — !BYETTA 5 MCG DOSE PEN INJ: 5 | 30 days supply | Qty: 1 | Fill #1

## 2016-01-01 MED FILL — LISINOPRIL 40 MG TABLET: 40 | 30 days supply | Qty: 30 | Fill #1

## 2016-01-01 MED FILL — $JANUVIA 50 MG TABLET: 50 | 30 days supply | Qty: 30 | Fill #1

## 2016-01-13 MED FILL — FUROSEMIDE 40 MG TABLET: 40 | 30 days supply | Qty: 90 | Fill #3

## 2016-01-13 MED FILL — CARVEDILOL 25 MG TABLET: 25 | 30 days supply | Qty: 60 | Fill #5

## 2016-01-13 MED FILL — hydrOXYzine HCL 50 MG TABS: 50 | 10 days supply | Qty: 30 | Fill #2

## 2016-01-13 MED FILL — VALACYCLOVIR HCL 500 MG TAB: 500 | 30 days supply | Qty: 30 | Fill #1

## 2016-01-13 MED FILL — hydrALAZINE HCL 25 MG TABS: 25 | 30 days supply | Qty: 90 | Fill #1

## 2016-01-13 MED FILL — $JANUVIA 50 MG TABLET: 50 | 30 days supply | Qty: 30 | Fill #2

## 2016-01-13 MED FILL — ATORVASTATIN 20 MG TABLET: 20 | 30 days supply | Qty: 30 | Fill #3

## 2016-01-17 ENCOUNTER — Other Ambulatory Visit: Payer: Self-pay

## 2016-01-17 DIAGNOSIS — E118 Type 2 diabetes mellitus with unspecified complications: Principal | ICD-10-CM

## 2016-01-17 DIAGNOSIS — E1165 Type 2 diabetes mellitus with hyperglycemia: Secondary | ICD-10-CM

## 2016-01-17 MED ORDER — EXENATIDE 5 MCG/0.02ML ~~LOC~~ SOPN: 5.0000 ug | PEN_INJECTOR | Freq: Two times a day (BID) | SUBCUTANEOUS | Status: DC

## 2016-01-31 MED FILL — !NOVOLOG FLEXPEN SYRINGE 1: 100/ML | 30 days supply | Qty: 9 | Fill #1

## 2016-02-03 MED FILL — $LANTUS SOLOSTAR 100 UNITS/: 100 | 30 days supply | Qty: 15 | Fill #4

## 2016-02-03 MED FILL — POTASSIUM CL ER 20 MEQ TAB: 20 | 30 days supply | Qty: 60 | Fill #2

## 2016-02-03 MED FILL — hydrOXYzine HCL 50 MG TABS: 50 | 10 days supply | Qty: 30 | Fill #3

## 2016-02-07 MED FILL — LISINOPRIL 40 MG TABLET: 40 | 30 days supply | Qty: 30 | Fill #2

## 2016-02-07 MED FILL — BYETTA 5 MCG DOSE PEN INJ: 5 | 30 days supply | Qty: 1 | Fill #2

## 2016-02-10 ENCOUNTER — Encounter (HOSPITAL_COMMUNITY): Payer: Self-pay | Admitting: *Deleted

## 2016-02-10 ENCOUNTER — Emergency Department (HOSPITAL_COMMUNITY)
Admission: EM | Admit: 2016-02-10 | Discharge: 2016-02-11 | Disposition: A | Discharge status: Home / Self Care | Payer: Self-pay | Attending: Emergency Medicine | Admitting: Emergency Medicine

## 2016-02-10 ENCOUNTER — Emergency Department (HOSPITAL_COMMUNITY): Payer: Self-pay

## 2016-02-10 DIAGNOSIS — Y9289 Other specified places as the place of occurrence of the external cause: Secondary | ICD-10-CM | POA: Insufficient documentation

## 2016-02-10 DIAGNOSIS — E119 Type 2 diabetes mellitus without complications: Secondary | ICD-10-CM | POA: Insufficient documentation

## 2016-02-10 DIAGNOSIS — S1989XA Other specified injuries of other specified part of neck, initial encounter: Secondary | ICD-10-CM | POA: Insufficient documentation

## 2016-02-10 DIAGNOSIS — W231XXA Caught, crushed, jammed, or pinched between stationary objects, initial encounter: Secondary | ICD-10-CM | POA: Insufficient documentation

## 2016-02-10 DIAGNOSIS — Y939 Activity, unspecified: Secondary | ICD-10-CM | POA: Insufficient documentation

## 2016-02-10 DIAGNOSIS — Z794 Long term (current) use of insulin: Secondary | ICD-10-CM | POA: Insufficient documentation

## 2016-02-10 DIAGNOSIS — I509 Heart failure, unspecified: Secondary | ICD-10-CM | POA: Insufficient documentation

## 2016-02-10 DIAGNOSIS — Z7984 Long term (current) use of oral hypoglycemic drugs: Secondary | ICD-10-CM | POA: Insufficient documentation

## 2016-02-10 DIAGNOSIS — Y999 Unspecified external cause status: Secondary | ICD-10-CM | POA: Insufficient documentation

## 2016-02-10 DIAGNOSIS — J45909 Unspecified asthma, uncomplicated: Secondary | ICD-10-CM | POA: Insufficient documentation

## 2016-02-10 DIAGNOSIS — Z79899 Other long term (current) drug therapy: Secondary | ICD-10-CM | POA: Insufficient documentation

## 2016-02-10 DIAGNOSIS — Z7982 Long term (current) use of aspirin: Secondary | ICD-10-CM | POA: Insufficient documentation

## 2016-02-10 DIAGNOSIS — I110 Hypertensive heart disease with heart failure: Secondary | ICD-10-CM | POA: Insufficient documentation

## 2016-02-10 DIAGNOSIS — S161XXA Strain of muscle, fascia and tendon at neck level, initial encounter: Secondary | ICD-10-CM

## 2016-02-10 DIAGNOSIS — S60222A Contusion of left hand, initial encounter: Secondary | ICD-10-CM | POA: Insufficient documentation

## 2016-02-10 DIAGNOSIS — Z87891 Personal history of nicotine dependence: Secondary | ICD-10-CM | POA: Insufficient documentation

## 2016-02-10 MED ORDER — IBUPROFEN 800 MG PO TABS: 800.0000 mg | ORAL_TABLET | Freq: Three times a day (TID) | ORAL | 0 refills | Status: DC

## 2016-02-10 MED ORDER — TRAMADOL HCL 50 MG PO TABS
50.0000 mg | ORAL_TABLET | Freq: Once | ORAL | Status: AC
  Administered 2016-02-10: 50 mg via ORAL
  Filled 2016-02-10: qty 1

## 2016-02-10 MED ORDER — CYCLOBENZAPRINE HCL 10 MG PO TABS: 10.0000 mg | ORAL_TABLET | Freq: Two times a day (BID) | ORAL | 0 refills | Status: DC | PRN

## 2016-02-10 NOTE — ED Provider Notes (Signed)
[name] DEPT Provider Note   CSN: [id number] Arrival date & time: 02/10/16  2018  First Provider Contact:   First PA Initiated Contact with Patient 02/10/16 2252     By signing my name below, I, [name], attest that this documentation has been prepared under the direction and in the presence of Delsa Grana, PA-C. Electronically Signed: Dora [name], Scribe. 02/10/2016. 10:41 PM.   History   Chief Complaint Chief Complaint  Patient presents with  . Arm Pain    The history is provided by the patient. No language interpreter was used.    HPI Comments: HEYLI [name] is a 60 y.o. female who presents to the Emergency Department complaining of sudden onset, constant, 10/10, left index finger and left thumb pain s/p crushing left hand in a grocery store freezer door shortly PTA. Pt notes pain radiation into her left forearm. She also notes left sided neck pain; she describes this pain as a pulling sensation. Pt tried Tylenol for pain PTA with no relief; she did not try any other treatments. She denies neuro deficits, numbness, or any other associated symptoms.  Past Medical History:  Diagnosis Date  . Asthma    As a child   . Bronchitis   . CHF (congestive heart failure) ([employer]) 2011  . Complication of anesthesia   . Diabetes mellitus Dx 1989  . Hepatitis C Dx 2013  . Hypertension Dx 1989  . Obesity   . Pancreatitis 2013  . Refusal of blood transfusions as patient is Jehovah's Witness   . Ulcer 2010    Patient Active Problem List   Diagnosis Date Noted  . Asthma 11/29/2015  . Trigger middle finger 11/25/2015  . Depression 09/05/2015  . GERD (gastroesophageal reflux disease) 03/25/2015  . Environmental allergies 03/14/2015  . Hep C w/o coma, chronic ([place]) 01/31/2015  . Diabetic neuropathy ([place]) 01/31/2015  . OSA (obstructive sleep apnea) 01/01/2015  . Poor dentition 11/13/2014  . Obesity 10/08/2014  . CKD (chronic kidney disease) stage 3, GFR 30-59 ml/[name]  10/08/2014  . Uncontrolled hypertension 03/25/2012  . Diabetes mellitus type 2, uncontrolled, with complications ([place]) 02/54/2706    Class: Chronic  . Chronic diastolic CHF (congestive heart failure), NYHA class 2 ([place]) 03/25/2012    Past Surgical History:  Procedure Laterality Date  . CHOLECYSTECTOMY    . EYE SURGERY    . KNEE ARTHROSCOPY    . TUBAL LIGATION    . TUBAL LIGATION      OB History    No data available       Home Medications    Prior to Admission medications   Medication Sig Start Date End Date Taking? Authorizing Provider  acetaminophen-codeine (TYLENOL #3) 300-30 MG tablet Take 1 tablet by mouth every 8 (eight) hours as needed. 11/25/15   Josalyn Funches, MD  albuterol (PROAIR HFA) 108 (90 BASE) MCG/ACT inhaler Inhale 2 puffs into the lungs every 4 (four) hours as needed for wheezing. 04/08/15   Arnoldo Morale, MD  albuterol (PROVENTIL) (2.5 MG/3ML) 0.083% nebulizer solution Take 3 mLs (2.5 mg total) by nebulization every 6 (six) hours as needed for wheezing or shortness of breath. 03/25/15   Arnoldo Morale, MD  aspirin 325 MG tablet Take 1 tablet (325 mg total) by mouth daily. 06/27/15   Josalyn Funches, MD  atorvastatin (LIPITOR) 20 MG tablet Take 1 tablet (20 mg total) by mouth daily. 01/24/15   Boykin Nearing, MD  Blood Glucose Monitoring Suppl (TRUE METRIX METER) w/Device KIT 1 each  by Does not apply route as needed. 11/25/15   Josalyn Funches, MD  carvedilol (COREG) 25 MG tablet Take 0.5 tablets (12.5 mg total) by mouth 2 (two) times daily with a meal. 09/20/15   Josalyn Funches, MD  cyclobenzaprine (FLEXERIL) 10 MG tablet Take 1 tablet (10 mg total) by mouth 2 (two) times daily as needed for muscle spasms. 10/07/15   Daleen Bo, MD  cyclobenzaprine (FLEXERIL) 10 MG tablet Take 1 tablet (10 mg total) by mouth 2 (two) times daily as needed for muscle spasms. 02/10/16   Delsa Grana, PA-C  diclofenac sodium (VOLTAREN) 1 % GEL Apply 4 g topically daily as needed (for  pain). 11/25/15   Josalyn Funches, MD  exenatide (BYETTA 5 MCG PEN) 5 MCG/0.02ML SOPN injection Inject 0.02 mLs (5 mcg total) into the skin 2 (two) times daily with a meal. 01/17/16   Josalyn Funches, MD  fluticasone (FLONASE) 50 MCG/ACT nasal spray Place 2 sprays into both nostrils daily. Patient taking differently: Place 2 sprays into both nostrils daily as needed for allergies.  10/01/15   Josalyn Funches, MD  Fluticasone-Salmeterol (ADVAIR DISKUS) 250-50 MCG/DOSE AEPB Inhale 1 puff into the lungs 2 (two) times daily. 03/25/15   Arnoldo Morale, MD  furosemide (LASIX) 40 MG tablet 80 mg at 8 AM and 40 mg 2 PM Patient taking differently: Take 40-80 mg by mouth 2 (two) times daily. 80 mg at 8 AM and 40 mg 2 PM 09/05/15   Josalyn Funches, MD  glucose blood (TRUE METRIX BLOOD GLUCOSE TEST) test strip 1 each by Other route 3 (three) times daily. 11/25/15   Josalyn Funches, MD  hydrALAZINE (APRESOLINE) 25 MG tablet Take 1 tablet (25 mg total) by mouth 3 (three) times daily. 11/25/15   Josalyn Funches, MD  hydrOXYzine (ATARAX/VISTARIL) 50 MG tablet Take 1 tablet (50 mg total) by mouth 3 (three) times daily as needed. 11/25/15   Josalyn Funches, MD  ibuprofen (ADVIL,MOTRIN) 800 MG tablet Take 1 tablet (800 mg total) by mouth 3 (three) times daily. 02/10/16   Delsa Grana, PA-C  insulin aspart (NOVOLOG) 100 UNIT/ML FlexPen 5 Units 3 times daily with meals for one week, then increase to 10 Units TID 12/12/15   Josalyn Funches, MD  Insulin Glargine (LANTUS SOLOSTAR) 100 UNIT/ML Solostar Pen Inject 50 Units into the skin daily at 10 pm. 11/25/15   Josalyn Funches, MD  Insulin Pen Needle (PX SHORTLENGTH PEN NEEDLES) 31G X 8 MM MISC 1 each by Does not apply route 4 (four) times daily. 11/25/15   Josalyn Funches, MD  ketotifen (ZADITOR) 0.025 % ophthalmic solution Place 1 drop into both eyes 2 (two) times daily. Patient taking differently: Place 1 drop into both eyes daily as needed (for dry eyes).  03/14/15   Josalyn Funches, MD    lisinopril (PRINIVIL,ZESTRIL) 40 MG tablet Take 1 tablet (40 mg total) by mouth daily. 11/25/15   Josalyn Funches, MD  ondansetron (ZOFRAN) 8 MG tablet Take 1 tablet (8 mg total) by mouth every 8 (eight) hours as needed for nausea or vomiting. 09/17/15   Josalyn Funches, MD  potassium chloride SA (K-DUR,KLOR-CON) 20 MEQ tablet TAKE 1 TABLET BY MOUTH TWICE DAILY WITH LASIX 11/12/15   Josalyn Funches, MD  pregabalin (LYRICA) 100 MG capsule Take 1 capsule (100 mg total) by mouth 3 (three) times daily. 09/05/15   Josalyn Funches, MD  ranitidine (ZANTAC) 150 MG tablet TAKE 1 TABLET BY MOUTH 2 TIMES DAILY. 11/04/15   Boykin Nearing, MD  sitaGLIPtin (JANUVIA)  50 MG tablet Take 1 tablet (50 mg total) by mouth daily. 11/20/15   Josalyn Funches, MD  valACYclovir (VALTREX) 500 MG tablet TAKE 1 TABLET BY MOUTH DAILY AS NEEDED FOR OUTBREAK 08/30/15   Boykin Nearing, MD    Family History Family History  Problem Relation Age of Onset  . Colon cancer Mother   . Heart attack Other   . Heart attack Maternal Grandmother   . Hypertension Sister   . Hypertension Brother   . Diabetes Paternal Grandmother     Social History Social History  Substance Use Topics  . Smoking status: Former Smoker    Quit date: 10/25/1980  . Smokeless tobacco: Never Used     Comment: quit smoking in 1982  . Alcohol use Yes     Comment: 3 times in last year     Allergies   Shellfish allergy; Diazepam; and Morphine and related   Review of Systems Review of Systems  All other systems reviewed and are negative.   Physical Exam Updated Vital Signs BP 150/68   Pulse 77   Temp 98.4 F (36.9 C)   Resp 18   Ht 5' 8"  (1.727 m)   Wt 126.1 kg   SpO2 98%   BMI 42.27 kg/m   Physical Exam  Constitutional: She is oriented to person, place, and time. She appears well-developed and well-nourished. No distress.  HENT:  Head: Normocephalic and atraumatic.  Eyes: Conjunctivae and EOM are normal.  Neck: Normal range of motion.  Neck supple. No tracheal deviation present.  Left trapezius tenderness from left paraspinal to left shoulder No cervical spinal process tenderness  Cardiovascular: Normal rate.   Pulmonary/Chest: Effort normal. No respiratory distress.  Musculoskeletal: Normal range of motion.  Pt with tenderness to light touch to left hand, wrist and fingers 1-3, normal ROM, normal sensation, normal capillary refill When pt was distracted talking she had no bony tenderness, no snuff box tenderness Normal Left elbow Normal Left shoulder  Lymphadenopathy:    She has no cervical adenopathy.  Neurological: She is alert and oriented to person, place, and time. She exhibits normal muscle tone. Coordination normal.  Skin: Skin is warm and dry. Capillary refill takes less than 2 seconds. No erythema. No pallor.  Psychiatric: She has a normal mood and affect. Her behavior is normal.  Nursing note and vitals reviewed.    ED Treatments / Results  Labs (all labs ordered are listed, but only abnormal results are displayed) Labs Reviewed - No data to display  EKG  EKG Interpretation None       Radiology Dg Hand Complete Left  Result Date: 02/10/2016 CLINICAL DATA:  60 year old female after blunt trauma in freezer door. Left middle finger pain. Initial encounter. EXAM: LEFT HAND - COMPLETE 3+ VIEW COMPARISON:  Left hand series 2411. FINDINGS: Distal radius and ulna intact. Carpal bone alignment remains normal. Metacarpals are stable and intact. Phalanges appear intact. Distal joint spaces and alignment preserved. No acute osseous abnormality identified. IMPRESSION: No acute fracture or dislocation identified about the left hand. Electronically Signed   By: Genevie Ann M.D.   On: 02/10/2016 23:22    Procedures Procedures (including critical care time)  DIAGNOSTIC STUDIES: Oxygen Saturation is 98% on RA, normal by my interpretation.    COORDINATION OF CARE: 10:41 PM Discussed treatment plan with pt at bedside  and pt agreed to plan.   Medications Ordered in ED Medications  traMADol (ULTRAM) tablet 50 mg (50 mg Oral Given 02/10/16 2357)  Initial Impression / Assessment and Plan / ED Course  I have reviewed the triage vital signs and the nursing notes.  Pertinent labs & imaging results that were available during my care of the patient were reviewed by me and considered in my medical decision making (see chart for details).  Clinical Course   Pt with reported left hand and arm injury when an ice cream freezer door shut on her, "snatched her."  She also complains of gradual left neck pain and stiffness.  No visible signs of injury, no erythema, no edema.  Pt has normal exam when distracted, normal ROM and sensation.  Mild left lateral neck muscular tenderness, no neck or head injury.  Xrays of hand negative.  Pt given pain meds in the ER, placed in ace wrap.  Encouraged RICE tx, given NSAIDs and flexeril Rx.  I personally performed the services described in this documentation, which was scribed in my presence. The recorded information has been reviewed and is accurate.     Final Clinical Impressions(s) / ED Diagnoses   Final diagnoses:  Hand contusion, left, initial encounter  Acute strain of neck muscle, initial encounter    New Prescriptions New Prescriptions   CYCLOBENZAPRINE (FLEXERIL) 10 MG TABLET    Take 1 tablet (10 mg total) by mouth 2 (two) times daily as needed for muscle spasms.   IBUPROFEN (ADVIL,MOTRIN) 800 MG TABLET    Take 1 tablet (800 mg total) by mouth 3 (three) times daily.     Delsa Grana, PA-C 02/11/16 0101    Virgel Manifold, MD 02/13/16 2154

## 2016-02-10 NOTE — ED Triage Notes (Signed)
The pt was at a grocery store and she opened a freezer door and it fell onto her lt arm ans shoulder she is also c/o neck pain   Just pta  lmp

## 2016-02-11 ENCOUNTER — Other Ambulatory Visit: Payer: Self-pay | Admitting: Family Medicine

## 2016-02-11 DIAGNOSIS — E1142 Type 2 diabetes mellitus with diabetic polyneuropathy: Secondary | ICD-10-CM

## 2016-02-11 NOTE — ED Notes (Signed)
Pt requesting RN to identify what strength medication the tramadol is. This RN responded 50 mg. The pt responded "that ain't gonna do anything for me. I came here for control of the pain." Pt states we are not doing anything for her pain. This RN convinced the pt to take the medication and inform the pt that the tramadol would help with the pain." Pt states that she isn't even giving me anything for my hand. My hand is what hurts.

## 2016-02-13 ENCOUNTER — Other Ambulatory Visit: Payer: Self-pay

## 2016-02-13 DIAGNOSIS — E1165 Type 2 diabetes mellitus with hyperglycemia: Secondary | ICD-10-CM

## 2016-02-13 DIAGNOSIS — E118 Type 2 diabetes mellitus with unspecified complications: Principal | ICD-10-CM

## 2016-02-17 ENCOUNTER — Encounter (HOSPITAL_COMMUNITY): Payer: Self-pay | Admitting: Emergency Medicine

## 2016-02-17 ENCOUNTER — Other Ambulatory Visit: Payer: Self-pay | Admitting: Family Medicine

## 2016-02-17 ENCOUNTER — Emergency Department (HOSPITAL_COMMUNITY)
Admission: EM | Admit: 2016-02-17 | Discharge: 2016-02-17 | Disposition: A | Discharge status: Home / Self Care | Payer: Medicaid Other | Attending: Emergency Medicine | Admitting: Emergency Medicine

## 2016-02-17 DIAGNOSIS — W231XXD Caught, crushed, jammed, or pinched between stationary objects, subsequent encounter: Secondary | ICD-10-CM | POA: Insufficient documentation

## 2016-02-17 DIAGNOSIS — Z7982 Long term (current) use of aspirin: Secondary | ICD-10-CM | POA: Insufficient documentation

## 2016-02-17 DIAGNOSIS — Z9109 Other allergy status, other than to drugs and biological substances: Secondary | ICD-10-CM

## 2016-02-17 DIAGNOSIS — S6992XD Unspecified injury of left wrist, hand and finger(s), subsequent encounter: Secondary | ICD-10-CM | POA: Insufficient documentation

## 2016-02-17 DIAGNOSIS — N183 Chronic kidney disease, stage 3 (moderate): Secondary | ICD-10-CM | POA: Insufficient documentation

## 2016-02-17 DIAGNOSIS — I130 Hypertensive heart and chronic kidney disease with heart failure and stage 1 through stage 4 chronic kidney disease, or unspecified chronic kidney disease: Secondary | ICD-10-CM | POA: Insufficient documentation

## 2016-02-17 DIAGNOSIS — Z794 Long term (current) use of insulin: Secondary | ICD-10-CM | POA: Insufficient documentation

## 2016-02-17 DIAGNOSIS — E1122 Type 2 diabetes mellitus with diabetic chronic kidney disease: Secondary | ICD-10-CM | POA: Insufficient documentation

## 2016-02-17 DIAGNOSIS — Z79899 Other long term (current) drug therapy: Secondary | ICD-10-CM | POA: Insufficient documentation

## 2016-02-17 DIAGNOSIS — E1140 Type 2 diabetes mellitus with diabetic neuropathy, unspecified: Secondary | ICD-10-CM | POA: Insufficient documentation

## 2016-02-17 DIAGNOSIS — I5032 Chronic diastolic (congestive) heart failure: Secondary | ICD-10-CM | POA: Insufficient documentation

## 2016-02-17 DIAGNOSIS — Z87891 Personal history of nicotine dependence: Secondary | ICD-10-CM | POA: Insufficient documentation

## 2016-02-17 DIAGNOSIS — J45909 Unspecified asthma, uncomplicated: Secondary | ICD-10-CM | POA: Insufficient documentation

## 2016-02-17 MED ORDER — NAPROXEN 500 MG PO TABS: 500.0000 mg | ORAL_TABLET | Freq: Two times a day (BID) | ORAL | 0 refills | Status: DC

## 2016-02-17 MED FILL — ?CARVEDILOL 25 MG TABLET: 25 | 30 days supply | Qty: 60 | Fill #6

## 2016-02-17 MED FILL — ?FUROSEMIDE 40 MG TABLET: 40 | 30 days supply | Qty: 90 | Fill #4

## 2016-02-17 MED FILL — ?ATORVASTATIN 20 MG TABLET: 20 | 30 days supply | Qty: 30 | Fill #0

## 2016-02-17 MED FILL — $JANUVIA 50 MG TABLET: 50 | 30 days supply | Qty: 30 | Fill #3

## 2016-02-17 MED FILL — VALACYCLOVIR HCL 500 MG TAB: 500 | 30 days supply | Qty: 30 | Fill #2

## 2016-02-17 MED FILL — hydrOXYzine HCL 50 MG TABS: 50 | 10 days supply | Qty: 30 | Fill #0

## 2016-02-17 NOTE — ED Triage Notes (Signed)
Pt reports while at grocery store on the 31st she opened the freezer door and it fell on her. Pt seen here for same at that time. Continues to have pain.

## 2016-02-17 NOTE — ED Notes (Signed)
Declined W/C at D/C and was escorted to lobby by RN. 

## 2016-02-17 NOTE — Discharge Instructions (Signed)
Please call and followup with hand specialist for further care.  Wear splint for support.  Take Naproxen as needed.

## 2016-02-17 NOTE — ED Provider Notes (Signed)
[place] DEPT Provider Note   CSN: [id number] Arrival date & time: 02/17/16  1034  First Provider Contact:   First MD Initiated Contact with Patient 02/17/16 1139      By signing my name below, I, Evelene Croon, attest that this documentation has been prepared under the direction and in the presence of non-physician practitioner, Domenic Moras, PA-C. Electronically Signed: Evelene Croon, Scribe. 02/17/2016. 11:45 AM.  History   Chief Complaint Chief Complaint  Patient presents with  . Hand Injury    The history is provided by the patient. No language interpreter was used.     HPI Comments:  [name] is a 60 y.o. female who presents to the Emergency Department complaining of 9.5/10, constant pain to the space between the  to the left index finger and left thumb following injury ~ 1 week ago. The left hand was crushed by a glass door at the grocery store. She was evaluated in the ED after the incident on 02/10/16; she had a negative XR and was discharged with  ibuprofen and flexeril which she has been taking without relief. Pt is right hand dominant. She denies neck pain. Pt has no other complaints or symptoms at this time.    Past Medical History:  Diagnosis Date  . Asthma    As a child   . Bronchitis   . CHF (congestive heart failure) ([place]) 2011  . Complication of anesthesia   . Diabetes mellitus Dx 1989  . Hepatitis C Dx 2013  . Hypertension Dx 1989  . Obesity   . Pancreatitis 2013  . Refusal of blood transfusions as patient is Jehovah's Witness   . Ulcer 2010    Patient Active Problem List   Diagnosis Date Noted  . Asthma 11/29/2015  . Trigger middle finger 11/25/2015  . Depression 09/05/2015  . GERD (gastroesophageal reflux disease) 03/25/2015  . Environmental allergies 03/14/2015  . Hep C w/o coma, chronic ([place]) 01/31/2015  . Diabetic neuropathy ([place]) 01/31/2015  . OSA (obstructive sleep apnea) 01/01/2015  . Poor dentition 11/13/2014  . Obesity 10/08/2014    . CKD (chronic kidney disease) stage 3, GFR 30-59 ml/min 10/08/2014  . Uncontrolled hypertension 03/25/2012  . Diabetes mellitus type 2, uncontrolled, with complications ([place]) 93/79/0240    Class: Chronic  . Chronic diastolic CHF (congestive heart failure), NYHA class 2 ([place]) 03/25/2012    Past Surgical History:  Procedure Laterality Date  . CHOLECYSTECTOMY    . EYE SURGERY    . KNEE ARTHROSCOPY    . TUBAL LIGATION    . TUBAL LIGATION      OB History    No data available       Home Medications    Prior to Admission medications   Medication Sig Start Date End Date Taking? Authorizing Provider  acetaminophen-codeine (TYLENOL #3) 300-30 MG tablet TAKE 1 TABLET BY MOUTH EVERY 8 HOURS AS NEEDED. 02/12/16   Arnoldo Morale, MD  albuterol (PROAIR HFA) 108 (90 BASE) MCG/ACT inhaler Inhale 2 puffs into the lungs every 4 (four) hours as needed for wheezing. 04/08/15   Arnoldo Morale, MD  albuterol (PROVENTIL) (2.5 MG/3ML) 0.083% nebulizer solution Take 3 mLs (2.5 mg total) by nebulization every 6 (six) hours as needed for wheezing or shortness of breath. 03/25/15   Arnoldo Morale, MD  aspirin 325 MG tablet Take 1 tablet (325 mg total) by mouth daily. 06/27/15   Josalyn Funches, MD  atorvastatin (LIPITOR) 20 MG tablet Take 1 tablet (20 mg total) by  mouth daily. 01/24/15   Josalyn Funches, MD  Blood Glucose Monitoring Suppl (TRUE METRIX METER) w/Device KIT 1 each by Does not apply route as needed. 11/25/15   Josalyn Funches, MD  carvedilol (COREG) 25 MG tablet Take 0.5 tablets (12.5 mg total) by mouth 2 (two) times daily with a meal. 09/20/15   Josalyn Funches, MD  cyclobenzaprine (FLEXERIL) 10 MG tablet Take 1 tablet (10 mg total) by mouth 2 (two) times daily as needed for muscle spasms. 10/07/15   Daleen Bo, MD  cyclobenzaprine (FLEXERIL) 10 MG tablet Take 1 tablet (10 mg total) by mouth 2 (two) times daily as needed for muscle spasms. 02/10/16   Delsa Grana, PA-C  diclofenac sodium (VOLTAREN) 1 % GEL  Apply 4 g topically daily as needed (for pain). 11/25/15   Josalyn Funches, MD  exenatide (BYETTA 5 MCG PEN) 5 MCG/0.02ML SOPN injection Inject 0.02 mLs (5 mcg total) into the skin 2 (two) times daily with a meal. 01/17/16   Josalyn Funches, MD  fluticasone (FLONASE) 50 MCG/ACT nasal spray Place 2 sprays into both nostrils daily. Patient taking differently: Place 2 sprays into both nostrils daily as needed for allergies.  10/01/15   Josalyn Funches, MD  Fluticasone-Salmeterol (ADVAIR DISKUS) 250-50 MCG/DOSE AEPB Inhale 1 puff into the lungs 2 (two) times daily. 03/25/15   Arnoldo Morale, MD  furosemide (LASIX) 40 MG tablet 80 mg at 8 AM and 40 mg 2 PM Patient taking differently: Take 40-80 mg by mouth 2 (two) times daily. 80 mg at 8 AM and 40 mg 2 PM 09/05/15   Josalyn Funches, MD  glucose blood (TRUE METRIX BLOOD GLUCOSE TEST) test strip 1 each by Other route 3 (three) times daily. 11/25/15   Josalyn Funches, MD  hydrALAZINE (APRESOLINE) 25 MG tablet Take 1 tablet (25 mg total) by mouth 3 (three) times daily. 11/25/15   Josalyn Funches, MD  hydrOXYzine (ATARAX/VISTARIL) 50 MG tablet Take 1 tablet (50 mg total) by mouth 3 (three) times daily as needed. 11/25/15   Josalyn Funches, MD  ibuprofen (ADVIL,MOTRIN) 800 MG tablet Take 1 tablet (800 mg total) by mouth 3 (three) times daily. 02/10/16   Delsa Grana, PA-C  insulin aspart (NOVOLOG) 100 UNIT/ML FlexPen 5 Units 3 times daily with meals for one week, then increase to 10 Units TID 12/12/15   Josalyn Funches, MD  Insulin Glargine (LANTUS SOLOSTAR) 100 UNIT/ML Solostar Pen Inject 50 Units into the skin daily at 10 pm. 11/25/15   Josalyn Funches, MD  Insulin Pen Needle (PX SHORTLENGTH PEN NEEDLES) 31G X 8 MM MISC 1 each by Does not apply route 4 (four) times daily. 11/25/15   Josalyn Funches, MD  ketotifen (ZADITOR) 0.025 % ophthalmic solution Place 1 drop into both eyes 2 (two) times daily. Patient taking differently: Place 1 drop into both eyes daily as needed (for  dry eyes).  03/14/15   Josalyn Funches, MD  lisinopril (PRINIVIL,ZESTRIL) 40 MG tablet Take 1 tablet (40 mg total) by mouth daily. 11/25/15   Josalyn Funches, MD  ondansetron (ZOFRAN) 8 MG tablet Take 1 tablet (8 mg total) by mouth every 8 (eight) hours as needed for nausea or vomiting. 09/17/15   Josalyn Funches, MD  potassium chloride SA (K-DUR,KLOR-CON) 20 MEQ tablet TAKE 1 TABLET BY MOUTH TWICE DAILY WITH LASIX 11/12/15   Josalyn Funches, MD  pregabalin (LYRICA) 100 MG capsule Take 1 capsule (100 mg total) by mouth 3 (three) times daily. 09/05/15   Boykin Nearing, MD  ranitidine (ZANTAC) 150  MG tablet TAKE 1 TABLET BY MOUTH 2 TIMES DAILY. 11/04/15   Josalyn Funches, MD  sitaGLIPtin (JANUVIA) 50 MG tablet Take 1 tablet (50 mg total) by mouth daily. 11/20/15   Josalyn Funches, MD  valACYclovir (VALTREX) 500 MG tablet TAKE 1 TABLET BY MOUTH DAILY AS NEEDED FOR OUTBREAK 08/30/15   Dessa Phi, MD    Family History Family History  Problem Relation Age of Onset  . Colon cancer Mother   . Heart attack Other   . Heart attack Maternal Grandmother   . Hypertension Sister   . Hypertension Brother   . Diabetes Paternal Grandmother     Social History Social History  Substance Use Topics  . Smoking status: Former Smoker    Quit date: 10/25/1980  . Smokeless tobacco: Never Used     Comment: quit smoking in 1982  . Alcohol use Yes     Comment: 3 times in last year     Allergies   Shellfish allergy; Diazepam; and Morphine and related   Review of Systems Review of Systems  Constitutional: Negative for chills and fever.  Respiratory: Negative for shortness of breath.   Cardiovascular: Negative for chest pain.  Musculoskeletal: Positive for arthralgias and myalgias. Negative for neck pain.     Physical Exam Updated Vital Signs BP 168/63 (BP Location: Right Arm)   Pulse 60   Temp 97.7 F (36.5 C) (Oral)   Resp 18   Ht 5\' 8"  (1.727 m)   Wt 278 lb (126.1 kg)   SpO2 97%   BMI 42.27 kg/m    Physical Exam  Constitutional: She is oriented to person, place, and time. She appears well-developed and well-nourished. No distress.  HENT:  Head: Normocephalic and atraumatic.  Eyes: Conjunctivae are normal.  Cardiovascular: Normal rate.   Pulmonary/Chest: Effort normal.  Musculoskeletal:  Tenderness noted to left hand at the 1st and 2nd web space as well as the anatomical snuff box  Left 2nd finger with FROM  rad pulses 2+  flex/ext supination and pronation intact  No bruising or swelling noted   Neurological: She is alert and oriented to person, place, and time.  Skin: Skin is warm and dry.  Psychiatric: She has a normal mood and affect.  Nursing note and vitals reviewed.   ED Treatments / Results  DIAGNOSTIC STUDIES:  Oxygen Saturation is 97% on RA, normal by my interpretation.    COORDINATION OF CARE:  11:43 AM Discussed treatment plan with pt at bedside and pt agreed to plan.  Labs (all labs ordered are listed, but only abnormal results are displayed) Labs Reviewed - No data to display  EKG  EKG Interpretation None       Radiology No results found.  Procedures Procedures   Medications Ordered in ED Medications - No data to display   Initial Impression / Assessment and Plan / ED Course  I have reviewed the triage vital signs and the nursing notes.  Pertinent labs & imaging results that were available during my care of the patient were reviewed by me and considered in my medical decision making (see chart for details).  Clinical Course    BP 168/63 (BP Location: Right Arm)   Pulse 60   Temp 97.7 F (36.5 C) (Oral)   Resp 18   Ht 5\' 8"  (1.727 m)   Wt 126.1 kg   SpO2 97%   BMI 42.27 kg/m    Final Clinical Impressions(s) / ED Diagnoses  Patient X-Valladares negative for obvious fracture  or dislocation.  Pt advised to follow up with orthopedics. Patient given splint while in ED, conservative therapy recommended and discussed. Patient will be  discharged home & is agreeable with above plan. Returns precautions discussed. Pt appears safe for discharge.  Final diagnoses:  Hand injury, left, subsequent encounter    New Prescriptions New Prescriptions   NAPROXEN (NAPROSYN) 500 MG TABLET    Take 1 tablet (500 mg total) by mouth 2 (two) times daily.   I personally performed the services described in this documentation, which was scribed in my presence. The recorded information has been reviewed and is accurate.       Domenic Moras, PA-C 02/17/16 [place], MD 02/17/16 [phone]

## 2016-02-19 ENCOUNTER — Telehealth: Payer: Self-pay | Admitting: Family Medicine

## 2016-02-19 DIAGNOSIS — M65332 Trigger finger, left middle finger: Secondary | ICD-10-CM

## 2016-02-19 NOTE — Telephone Encounter (Signed)
Is this for the trigger finger in her L hand? Sports medicine referral placed. Ortho is indicated if surgery is possibly needed. Surgery is not needed for a trigger finger.

## 2016-02-19 NOTE — Telephone Encounter (Signed)
Patient was called and informed of sports medicine referral .

## 2016-02-19 NOTE — Telephone Encounter (Signed)
Pt called requesting referral to ortho for hand injury, pt is scheduled until 08/17 but would like to see specialist sooner. Please f/up

## 2016-02-24 ENCOUNTER — Ambulatory Visit (INDEPENDENT_AMBULATORY_CARE_PROVIDER_SITE_OTHER): Payer: Self-pay | Admitting: Sports Medicine

## 2016-02-24 ENCOUNTER — Encounter: Payer: Self-pay | Admitting: Sports Medicine

## 2016-02-24 DIAGNOSIS — S6992XA Unspecified injury of left wrist, hand and finger(s), initial encounter: Secondary | ICD-10-CM | POA: Insufficient documentation

## 2016-02-24 DIAGNOSIS — S6992XD Unspecified injury of left wrist, hand and finger(s), subsequent encounter: Secondary | ICD-10-CM

## 2016-02-24 NOTE — Assessment & Plan Note (Addendum)
Patient is here for evaluation of an injury she sustained to her left hand on 02/10/16. Patient has persistent reduced range of motion and pain of her second and third digits and wrist. X-rays the day of the injury yielded no concerns for fracture. Traumatic nature of this injury and negative ultrasound makes the most likely etiology a capsular sprain of the first and second metacarpophalangeal joints with associated flexor tendon strain of the flexor tendons. - Anti-inflammatories contraindicated secondary to kidney function. - Prednisone likely wouldn't have much benefit and is less likely to be tolerated secondary to diabetes. - Patient placed in a new left wrist splint forcing wrist to stay in some extension. - Encouraged icing regularly. - PCP prescribed Tylenol #3s >> continue taking as needed. - Follow-up in 2 weeks for reevaluation - Work note provided to stay out of work until reevaluation in 2 weeks.

## 2016-02-24 NOTE — Progress Notes (Signed)
HPI  CC: Left hand injury Patient is here today after suffering a left hand injury. She states that on 02/10/16 she was at [employer] when the freezer door in the frozen food section came off its hinges while she was gripping a handle. She states that the door forced her hand into an over supinated position. She was able to gain control of this object before it crushed her hand against the floor of the grocery store. She denies any feelings or sensations of popping during this event. She states that her hand seemed to swell within a couple of hours. She reported to the ED shortly thereafter. X-rays at that time were negative. She was placed in a wrist splint and sent home.   Now, 2 weeks later, she states that her second and third digit are difficult to flex secondary to stiffness/pain. Her index finger is worse than her middle finger. Pain is located to the palmar aspect of the base of these 2 fingers.  Medications/Interventions Tried: Left wrist brace, Tylenol #3s  See HPI for ROS. CC and VS noted.  Objective: BP (!) 150/62   Pulse 70   Ht 5\' 8"  (1.727 m)   Wt 290 lb (131.5 kg)   BMI 44.09 kg/m  Gen: Right-Hand Dominant. NAD, alert, cooperative. CV: Well-perfused. Resp: Non-labored. Neuro: Sensation intact throughout. Wrist/Hand, Left: Inspection normal with no visible erythema or swelling. ROM full in wrist flexion, radial deviation, and ulnar deviation. Wrist extension yields considerable pain near the base of her second and third digits, with some extension of the fingers. All 5 digits with full extension. Second and third digits with limited flexion (at the PIP and metacarpophalangeal joints) and associated pain. Palpation is relatively normal over metacarpals, scaphoid, lunate, and TFCC; tenderness discomfort noted along the ulnar aspect of the left forearm towards the medial epicondyle. Strength 5/5 in all directions without pain. Negative Finkelstein, tinel's and  phalens.  Ultrasound: Left hand/wrist: Brief ultrasound performed along the palmar aspect of the hand and wrist. No evidence of tendinous rupture or fracture. Some mild edema noted along the joint capsule of the second metacarpophalangeal joint. Otherwise a benign exam. No photos were saved.   Assessment and plan:  Injury of left hand Patient is here for evaluation of an injury she sustained to her left hand on 02/10/16. Patient has persistent reduced range of motion and pain of her second and third digits and wrist. X-rays the day of the injury yielded no concerns for fracture. Traumatic nature of this injury and negative ultrasound makes the most likely etiology a capsular sprain of the first and second metacarpophalangeal joints with associated flexor tendon strain of the flexor tendons. - Anti-inflammatories contraindicated secondary to kidney function. - Prednisone likely wouldn't have much benefit and is less likely to be tolerated secondary to diabetes. - Patient placed in a new left wrist splint forcing wrist to stay in some extension. - Encouraged icing regularly. - PCP prescribed Tylenol #3s >> continue taking as needed. - Follow-up in 2 weeks for reevaluation - Work note provided to stay out of work until reevaluation in 2 weeks.  Elberta Leatherwood, MD,MS,  PGY3 02/24/2016 12:19 PM    Patient seen and evaluated with the resident. I agree with the above plan of care. I think the patient suffered a simple strain of her hand. We will rest her hand and wrist in a cockup wrist brace and keep her out of work for 2 weeks. If pain persists at  follow-up, consider further diagnostic imaging versus referral to a hand specialist.

## 2016-02-27 ENCOUNTER — Inpatient Hospital Stay: Payer: Medicaid Other | Admitting: Family Medicine

## 2016-02-27 MED FILL — !NOVOLOG FLEXPEN SYRINGE 1: 100/ML | 9 days supply | Qty: 3 | Fill #2

## 2016-03-04 MED FILL — $novoLOG 100 UNITS/ML VIAL: 100 | 28 days supply | Qty: 10 | Fill #0

## 2016-03-09 ENCOUNTER — Other Ambulatory Visit: Payer: Self-pay

## 2016-03-09 DIAGNOSIS — E1165 Type 2 diabetes mellitus with hyperglycemia: Secondary | ICD-10-CM

## 2016-03-09 DIAGNOSIS — E118 Type 2 diabetes mellitus with unspecified complications: Principal | ICD-10-CM

## 2016-03-09 MED ORDER — INSULIN ASPART 100 UNIT/ML FLEXPEN: PEN_INJECTOR | SUBCUTANEOUS | 3 refills | Status: DC

## 2016-03-09 NOTE — Telephone Encounter (Signed)
Printed script for [employer] for pass

## 2016-03-10 ENCOUNTER — Ambulatory Visit (INDEPENDENT_AMBULATORY_CARE_PROVIDER_SITE_OTHER): Payer: Self-pay | Admitting: Sports Medicine

## 2016-03-10 ENCOUNTER — Encounter: Payer: Self-pay | Admitting: Sports Medicine

## 2016-03-10 VITALS — BP 162/65 | HR 66 | Ht 68.0 in | Wt 290.0 lb

## 2016-03-10 DIAGNOSIS — S6992XD Unspecified injury of left wrist, hand and finger(s), subsequent encounter: Secondary | ICD-10-CM

## 2016-03-10 NOTE — Progress Notes (Signed)
   Subjective:    Patient ID: [name], female    DOB: 08/26/1955, 60 y.o.   MRN: [id number]  HPI   Patient comes in today for follow-up on left hand pain. Her pain persists despite immobilization in a wrist brace. Her pain is diffuse throughout the hand but she tends to localize it primarily to the palm. Pain will radiate at times up into the wrist. Pain all began after she twisted her hand awkwardly in a freezer door at a local grocery store. X-rays were unremarkable. She has been out of work the past couple of weeks due to this injury.    Review of Systems     Objective:   Physical Exam  Well-developed, well-nourished. No acute distress. Sitting comfortable in the exam room. Vital signs reviewed  Left hand: There is no obvious soft tissue swelling or wrist effusion. She continues to have pain localized near the second and third MCP joints with wrist extension and finger flexion. Extensor and flexor tendons are intact. She is diffusely tender to palpation throughout the palm of the hand with some tenderness across the dorsum of the hand as well. Examination of the wrist shows no palpable tenderness. Limited range of motion secondary to hand pain. Good radial and ulnar pulses.       Assessment & Plan:   Persistent left hand pain-rule out partial muscle tear versus occult fracture versus tendon injury  Given her disability with his left hand since the injury I think we need to pursue further diagnostic imaging in the form of an MRI to get an overall picture of possible soft tissue pathology such as a muscle tear, occult fracture, or significant tendon injury which may warrant referral or longer period out of work. She will remain out of work until follow-up with me on September 11th at which time we will review her MRI together and delineate further treatment. In the meantime, she will continue with her wrist brace as she does think that it helps protect her from further injury.

## 2016-03-12 ENCOUNTER — Other Ambulatory Visit: Payer: Self-pay | Admitting: Family Medicine

## 2016-03-12 DIAGNOSIS — A6000 Herpesviral infection of urogenital system, unspecified: Secondary | ICD-10-CM

## 2016-03-12 MED FILL — hydrALAZINE HCL 25 MG TABS: 25 | 30 days supply | Qty: 90 | Fill #2

## 2016-03-12 MED FILL — VALACYCLOVIR HCL 500 MG TAB: 500 | 30 days supply | Qty: 30 | Fill #0

## 2016-03-12 MED FILL — LISINOPRIL 40 MG TABLET: 40 | 30 days supply | Qty: 30 | Fill #3

## 2016-03-12 MED FILL — $JANUVIA 50 MG TABLET: 50 | 30 days supply | Qty: 30 | Fill #4

## 2016-03-12 MED FILL — ?ATORVASTATIN 20 MG TABLET: 20 | 30 days supply | Qty: 30 | Fill #1

## 2016-03-12 MED FILL — hydrOXYzine HCL 50 MG TABS: 50 | 10 days supply | Qty: 30 | Fill #1

## 2016-03-21 ENCOUNTER — Other Ambulatory Visit: Payer: Medicaid Other

## 2016-03-23 ENCOUNTER — Other Ambulatory Visit: Payer: Self-pay | Admitting: Family Medicine

## 2016-03-23 ENCOUNTER — Ambulatory Visit: Payer: Medicaid Other | Admitting: Sports Medicine

## 2016-03-23 DIAGNOSIS — I5033 Acute on chronic diastolic (congestive) heart failure: Secondary | ICD-10-CM

## 2016-03-23 MED FILL — hydrOXYzine HCL 50 MG TABS: 50 | 10 days supply | Qty: 30 | Fill #2

## 2016-03-23 MED FILL — POTASSIUM CL ER 20 MEQ TAB: 20 | 30 days supply | Qty: 60 | Fill #3

## 2016-03-24 MED FILL — CARVEDILOL 25 MG TABLET: 25 | 30 days supply | Qty: 60 | Fill #0

## 2016-03-26 ENCOUNTER — Other Ambulatory Visit: Payer: Self-pay | Admitting: Family Medicine

## 2016-03-26 DIAGNOSIS — E1165 Type 2 diabetes mellitus with hyperglycemia: Secondary | ICD-10-CM

## 2016-03-26 MED FILL — BYETTA 5 MCG DOSE PEN INJ: 5 | 30 days supply | Qty: 1 | Fill #3

## 2016-03-26 MED FILL — $LANTUS SOLOSTAR 100 UNITS/: 100 | 30 days supply | Qty: 15 | Fill #1

## 2016-04-01 ENCOUNTER — Encounter (HOSPITAL_COMMUNITY): Payer: Self-pay | Admitting: Emergency Medicine

## 2016-04-01 ENCOUNTER — Emergency Department (HOSPITAL_COMMUNITY)
Admission: EM | Admit: 2016-04-01 | Discharge: 2016-04-01 | Disposition: A | Discharge status: Home / Self Care | Payer: Self-pay | Attending: Emergency Medicine | Admitting: Emergency Medicine

## 2016-04-01 ENCOUNTER — Emergency Department (HOSPITAL_COMMUNITY): Payer: Self-pay

## 2016-04-01 DIAGNOSIS — Z87891 Personal history of nicotine dependence: Secondary | ICD-10-CM | POA: Insufficient documentation

## 2016-04-01 DIAGNOSIS — E1122 Type 2 diabetes mellitus with diabetic chronic kidney disease: Secondary | ICD-10-CM | POA: Insufficient documentation

## 2016-04-01 DIAGNOSIS — Z79899 Other long term (current) drug therapy: Secondary | ICD-10-CM | POA: Insufficient documentation

## 2016-04-01 DIAGNOSIS — I130 Hypertensive heart and chronic kidney disease with heart failure and stage 1 through stage 4 chronic kidney disease, or unspecified chronic kidney disease: Secondary | ICD-10-CM | POA: Insufficient documentation

## 2016-04-01 DIAGNOSIS — N183 Chronic kidney disease, stage 3 (moderate): Secondary | ICD-10-CM | POA: Insufficient documentation

## 2016-04-01 DIAGNOSIS — Z7982 Long term (current) use of aspirin: Secondary | ICD-10-CM | POA: Insufficient documentation

## 2016-04-01 DIAGNOSIS — J45909 Unspecified asthma, uncomplicated: Secondary | ICD-10-CM | POA: Insufficient documentation

## 2016-04-01 DIAGNOSIS — M79671 Pain in right foot: Secondary | ICD-10-CM | POA: Insufficient documentation

## 2016-04-01 DIAGNOSIS — Z794 Long term (current) use of insulin: Secondary | ICD-10-CM | POA: Insufficient documentation

## 2016-04-01 DIAGNOSIS — I509 Heart failure, unspecified: Secondary | ICD-10-CM | POA: Insufficient documentation

## 2016-04-01 DIAGNOSIS — M79661 Pain in right lower leg: Secondary | ICD-10-CM | POA: Insufficient documentation

## 2016-04-01 MED ORDER — ENOXAPARIN SODIUM 150 MG/ML ~~LOC~~ SOLN
1.0000 mg/kg | SUBCUTANEOUS | Status: AC
  Administered 2016-04-01: 130 mg via SUBCUTANEOUS
  Filled 2016-04-01: qty 0.88

## 2016-04-01 MED ORDER — HYDROCODONE-ACETAMINOPHEN 5-325 MG PO TABS
1.0000 | ORAL_TABLET | Freq: Once | ORAL | Status: AC
Start: 2016-04-01 — End: 2016-04-01
  Administered 2016-04-01: 1 via ORAL
  Filled 2016-04-01: qty 1

## 2016-04-01 MED ORDER — CEPHALEXIN 500 MG PO CAPS: 500.0000 mg | ORAL_CAPSULE | Freq: Four times a day (QID) | ORAL | 0 refills | Status: DC

## 2016-04-01 MED ORDER — HYDROCODONE-ACETAMINOPHEN 5-325 MG PO TABS: 1.0000 | ORAL_TABLET | ORAL | 0 refills | Status: DC | PRN

## 2016-04-01 NOTE — ED Triage Notes (Signed)
Patient reports she was driving and pressing on the gas pedal and pain suddenly started in her right foot. Denies trauma/injury. When she arrived home she was unable to bear weight on this foot.

## 2016-04-01 NOTE — Discharge Instructions (Signed)
Read the information below.  Use the prescribed medication as directed.  Please discuss all new medications with your pharmacist.  Do not take additional tylenol while taking the prescribed pain medication to avoid overdose.  You may return to the Emergency Department at any time for worsening condition or any new symptoms that concern you.    If you develop uncontrolled pain, weakness or numbness of the extremity, severe discoloration of the skin, or you are unable to walk or move your foot, return to the ER for a recheck.

## 2016-04-01 NOTE — ED Provider Notes (Signed)
[name] DEPT Provider Note   CSN: [id number] Arrival date & time: 04/01/16  1737  By signing my name below, I, [name], attest that this documentation has been prepared under the direction and in the presence of [facility], PA-C. Electronically Signed: Johnney [name], ED Scribe. 04/01/16. 7:02 PM.    History   Chief Complaint Chief Complaint  Patient presents with  . Foot Pain    HPI Comments: [name] is a 60 y.o. female with h/o DM, HTN, diabetic neuropathy who presents to the Emergency Department complaining of moderate, sudden onset, constant right heel pain onset 3 hours ago. Pt first noticed the pain while pressing her foot on the gas pedal while driving. Pt also reports bilateral leg swelling (R>L) and shooting pain up her right leg that originates at the heel. She denies recent injury, trauma or fall, but does note recent long travel from [facility]. She says she cannot stand on her right foot secondary to pain and her pain is also worsened with ankle or foot movement. She states she has tried OTC Tylenol without significant relief. Pt states no h/o similar foot pain. No h/o PE/DVT. She notes her CBGs have not been extremely well controlled as of late.  She does have neuropathy. Pt denies fever, chills, body aches, chest pain, SOB.    The history is provided by the patient. No language interpreter was used.    Past Medical History:  Diagnosis Date  . Asthma    As a child   . Bronchitis   . CHF (congestive heart failure) ([place]) 2011  . Complication of anesthesia   . Diabetes mellitus Dx 1989  . Hepatitis C Dx 2013  . Hypertension Dx 1989  . Obesity   . Pancreatitis 2013  . Refusal of blood transfusions as patient is Jehovah's Witness   . Ulcer 2010    Patient Active Problem List   Diagnosis Date Noted  . Injury of left hand 02/24/2016  . Asthma 11/29/2015  . Trigger middle finger 11/25/2015  . Depression 09/05/2015  . GERD (gastroesophageal  reflux disease) 03/25/2015  . Environmental allergies 03/14/2015  . Hep C w/o coma, chronic ([place]) 01/31/2015  . Diabetic neuropathy ([place]) 01/31/2015  . OSA (obstructive sleep apnea) 01/01/2015  . Poor dentition 11/13/2014  . Obesity 10/08/2014  . CKD (chronic kidney disease) stage 3, GFR 30-59 ml/min 10/08/2014  . Uncontrolled hypertension 03/25/2012  . Diabetes mellitus type 2, uncontrolled, with complications ([place]) 65/79/0383    Class: Chronic  . Chronic diastolic CHF (congestive heart failure), NYHA class 2 ([place]) 03/25/2012    Past Surgical History:  Procedure Laterality Date  . CHOLECYSTECTOMY    . EYE SURGERY    . KNEE ARTHROSCOPY    . TUBAL LIGATION    . TUBAL LIGATION      OB History    No data available       Home Medications    Prior to Admission medications   Medication Sig Start Date End Date Taking? Authorizing Provider  acetaminophen-codeine (TYLENOL #3) 300-30 MG tablet TAKE 1 TABLET BY MOUTH EVERY 8 HOURS AS NEEDED. 02/12/16   Arnoldo Morale, MD  albuterol (PROAIR HFA) 108 (90 BASE) MCG/ACT inhaler Inhale 2 puffs into the lungs every 4 (four) hours as needed for wheezing. 04/08/15   Arnoldo Morale, MD  albuterol (PROVENTIL) (2.5 MG/3ML) 0.083% nebulizer solution Take 3 mLs (2.5 mg total) by nebulization every 6 (six) hours as needed for wheezing or shortness of breath. 03/25/15  Arnoldo Morale, MD  aspirin 325 MG tablet Take 1 tablet (325 mg total) by mouth daily. 06/27/15   Josalyn Funches, MD  atorvastatin (LIPITOR) 20 MG tablet TAKE 1 TABLET BY MOUTH DAILY 02/17/16   Boykin Nearing, MD  Blood Glucose Monitoring Suppl (TRUE METRIX METER) w/Device KIT 1 each by Does not apply route as needed. 11/25/15   Josalyn Funches, MD  carvedilol (COREG) 25 MG tablet TAKE 1 TABLET BY MOUTH 2 TIMES DAILY WITH A MEAL. 03/23/16   Josalyn Funches, MD  cephALEXin (KEFLEX) 500 MG capsule Take 1 capsule (500 mg total) by mouth 4 (four) times daily. 04/01/16   Clayton Bibles, PA-C    cyclobenzaprine (FLEXERIL) 10 MG tablet Take 1 tablet (10 mg total) by mouth 2 (two) times daily as needed for muscle spasms. 02/10/16   Delsa Grana, PA-C  diclofenac sodium (VOLTAREN) 1 % GEL Apply 4 g topically daily as needed (for pain). 11/25/15   Josalyn Funches, MD  exenatide (BYETTA 5 MCG PEN) 5 MCG/0.02ML SOPN injection Inject 0.02 mLs (5 mcg total) into the skin 2 (two) times daily with a meal. 01/17/16   Josalyn Funches, MD  fluticasone (FLONASE) 50 MCG/ACT nasal spray Place 2 sprays into both nostrils daily. Patient taking differently: Place 2 sprays into both nostrils daily as needed for allergies.  10/01/15   Josalyn Funches, MD  Fluticasone-Salmeterol (ADVAIR DISKUS) 250-50 MCG/DOSE AEPB Inhale 1 puff into the lungs 2 (two) times daily. 03/25/15   Arnoldo Morale, MD  furosemide (LASIX) 40 MG tablet 80 mg at 8 AM and 40 mg 2 PM Patient taking differently: Take 40-80 mg by mouth 2 (two) times daily. 80 mg at 8 AM and 40 mg 2 PM 09/05/15   Josalyn Funches, MD  glucose blood (TRUE METRIX BLOOD GLUCOSE TEST) test strip 1 each by Other route 3 (three) times daily. 11/25/15   Josalyn Funches, MD  hydrALAZINE (APRESOLINE) 25 MG tablet Take 1 tablet (25 mg total) by mouth 3 (three) times daily. 11/25/15   Boykin Nearing, MD  HYDROcodone-acetaminophen (NORCO/VICODIN) 5-325 MG tablet Take 1-2 tablets by mouth every 4 (four) hours as needed for moderate pain or severe pain. 04/01/16   Clayton Bibles, PA-C  hydrOXYzine (ATARAX/VISTARIL) 50 MG tablet TAKE 1 TABLET BY MOUTH 3 TIMES DAILY AS NEEDED. 02/17/16   Boykin Nearing, MD  ibuprofen (ADVIL,MOTRIN) 800 MG tablet Take 1 tablet (800 mg total) by mouth 3 (three) times daily. 02/10/16   Delsa Grana, PA-C  insulin aspart (NOVOLOG) 100 UNIT/ML FlexPen 5 Units 3 times daily with meals for one week, then increase to 10 Units TID 03/09/16   Boykin Nearing, MD  Insulin Glargine (LANTUS SOLOSTAR) 100 UNIT/ML Solostar Pen Inject 50 Units into the skin daily at 10 pm. 11/25/15    Adriana Mccallum Funches, MD  Insulin Pen Needle (PX SHORTLENGTH PEN NEEDLES) 31G X 8 MM MISC 1 each by Does not apply route 4 (four) times daily. 11/25/15   Josalyn Funches, MD  ketotifen (ZADITOR) 0.025 % ophthalmic solution Place 1 drop into both eyes 2 (two) times daily. Patient taking differently: Place 1 drop into both eyes daily as needed (for dry eyes).  03/14/15   Josalyn Funches, MD  LANTUS SOLOSTAR 100 UNIT/ML Solostar Pen INJECT 50 UNITS INTO THE SKIN DAILY AT 10 PM. 03/26/16   Boykin Nearing, MD  lisinopril (PRINIVIL,ZESTRIL) 40 MG tablet Take 1 tablet (40 mg total) by mouth daily. 11/25/15   Josalyn Funches, MD  naproxen (NAPROSYN) 500 MG tablet Take 1  tablet (500 mg total) by mouth 2 (two) times daily. 02/17/16   Domenic Moras, PA-C  ondansetron (ZOFRAN) 8 MG tablet Take 1 tablet (8 mg total) by mouth every 8 (eight) hours as needed for nausea or vomiting. 09/17/15   Josalyn Funches, MD  potassium chloride SA (K-DUR,KLOR-CON) 20 MEQ tablet TAKE 1 TABLET BY MOUTH TWICE DAILY WITH LASIX 11/12/15   Josalyn Funches, MD  pregabalin (LYRICA) 100 MG capsule Take 1 capsule (100 mg total) by mouth 3 (three) times daily. 09/05/15   Josalyn Funches, MD  ranitidine (ZANTAC) 150 MG tablet TAKE 1 TABLET BY MOUTH 2 TIMES DAILY. 11/04/15   Josalyn Funches, MD  sitaGLIPtin (JANUVIA) 50 MG tablet Take 1 tablet (50 mg total) by mouth daily. 11/20/15   Josalyn Funches, MD  valACYclovir (VALTREX) 500 MG tablet TAKE 1 TABLET BY MOUTH DAILY. 03/12/16   Boykin Nearing, MD    Family History Family History  Problem Relation Age of Onset  . Colon cancer Mother   . Heart attack Other   . Heart attack Maternal Grandmother   . Hypertension Sister   . Hypertension Brother   . Diabetes Paternal Grandmother     Social History Social History  Substance Use Topics  . Smoking status: Former Smoker    Quit date: 10/25/1980  . Smokeless tobacco: Never Used     Comment: quit smoking in 1982  . Alcohol use Yes     Comment: 3 times  in last year     Allergies   Shellfish allergy; Diazepam; and Morphine and related   Review of Systems Review of Systems  Constitutional: Negative for chills and fever.  Respiratory: Negative for shortness of breath.   Cardiovascular: Positive for leg swelling. Negative for chest pain.  Musculoskeletal: Positive for arthralgias (foot pain) and myalgias.       -body aches  Skin: Negative for color change, pallor and wound.  Allergic/Immunologic: Positive for immunocompromised state.  Neurological: Negative for weakness and numbness.  Hematological: Does not bruise/bleed easily.  Psychiatric/Behavioral: Negative for self-injury.     Physical Exam Updated Vital Signs BP 154/65 (BP Location: Right Arm)   Pulse 64   Temp 98.4 F (36.9 C) (Oral)   Resp 16   Ht 5' 8"  (1.727 m)   Wt 131.5 kg   SpO2 100%   BMI 44.09 kg/m   Physical Exam  Constitutional: She appears well-developed and well-nourished. No distress.  HENT:  Head: Normocephalic and atraumatic.  Neck: Neck supple.  Cardiovascular:  DP pulses intact  Pulmonary/Chest: Effort normal.  Musculoskeletal: She exhibits tenderness.  Tenderness and fullness around the right heel with large amount of skin scale without noted wound.  Heel is larger or swollen on right compared to left.  There is some erythema vs ecchymosis over the lateral foot. Dorsalis pedis pulses are intact.  No bony tenderness.   Tenderness of the right calf.  No erythema or warmth.    Neurological: She is alert.  Skin: She is not diaphoretic.  Nursing note and vitals reviewed.    ED Treatments / Results   DIAGNOSTIC STUDIES: Oxygen Saturation is 95% on RA, adequate by my interpretation.    COORDINATION OF CARE: 6:52 PM Discussed treatment plan with pt at bedside which includes XR, outpatient [place] and pt agreed to plan.   Labs (all labs ordered are listed, but only abnormal results are displayed) Labs Reviewed - No data to display  EKG  EKG  Interpretation None  Radiology Dg Foot Complete Right  Result Date: 04/01/2016 CLINICAL DATA:  Posterior heel pain, foot gave out while driving today. EXAM: RIGHT FOOT COMPLETE - 3+ VIEW COMPARISON:  None. FINDINGS: No acute fracture deformity or dislocation. Marginal fifth PIP spurring. Small os perineum. Joint space intact without erosions. No destructive bony lesions. Soft tissue planes are not suspicious. IMPRESSION: Negative. Electronically Signed   By: Elon Alas M.D.   On: 04/01/2016 20:04    Procedures Procedures (including critical care time)  Medications Ordered in ED Medications  HYDROcodone-acetaminophen (NORCO/VICODIN) 5-325 MG per tablet 1 tablet (1 tablet Oral Given 04/01/16 1939)  enoxaparin (LOVENOX) injection 130 mg (130 mg Subcutaneous Given 04/01/16 2116)     Initial Impression / Assessment and Plan / ED Course  I have reviewed the triage vital signs and the nursing notes.  Pertinent labs & imaging results that were available during my care of the patient were reviewed by me and considered in my medical decision making (see chart for details).  Clinical Course    Afebrile, nontoxic patient with acute onset right heel pain with swelling and tenderness into calf.  She does have some mild discoloration that may be bruising or erythema.  Has uncontrolled diabetes and right foot scale, possible cellulitis.  She was also recently on a trip to [facility] (earlier this month) and has tenderness in the right calf.  Lovenox given with [place] scheduled for the morning.    D/C home with keflex, norco, PCP follow up in 2 days for recheck, [place] [facility] tomorrow.  Discussed result, findings, treatment, and follow up  with patient.  Pt given return precautions.  Pt verbalizes understanding and agrees with plan.       Final Clinical Impressions(s) / ED Diagnoses   Final diagnoses:  Heel pain, right  Pain in right lower leg    New Prescriptions Discharge Medication List  as of 04/01/2016  8:27 PM    START taking these medications   Details  cephALEXin (KEFLEX) 500 MG capsule Take 1 capsule (500 mg total) by mouth 4 (four) times daily., Starting Wed 04/01/2016, Print    HYDROcodone-acetaminophen (NORCO/VICODIN) 5-325 MG tablet Take 1-2 tablets by mouth every 4 (four) hours as needed for moderate pain or severe pain., Starting Wed 04/01/2016, Print       I personally performed the services described in this documentation, which was scribed in my presence. The recorded information has been reviewed and is accurate.      Clayton Bibles, PA-C 04/01/16 2158    Dorie Rank, MD 04/01/16 [phone]

## 2016-04-02 ENCOUNTER — Inpatient Hospital Stay: Admission: RE | Admit: 2016-04-02 | Payer: Medicaid Other | Source: Ambulatory Visit

## 2016-04-02 ENCOUNTER — Ambulatory Visit (HOSPITAL_COMMUNITY)
Admission: RE | Admit: 2016-04-02 | Discharge: 2016-04-02 | Disposition: A | Discharge status: Home / Self Care | Payer: Self-pay | Source: Ambulatory Visit | Attending: Physician Assistant | Admitting: Physician Assistant

## 2016-04-02 DIAGNOSIS — M7989 Other specified soft tissue disorders: Secondary | ICD-10-CM | POA: Insufficient documentation

## 2016-04-02 DIAGNOSIS — M79604 Pain in right leg: Secondary | ICD-10-CM | POA: Insufficient documentation

## 2016-04-02 DIAGNOSIS — M79609 Pain in unspecified limb: Secondary | ICD-10-CM

## 2016-04-02 NOTE — Progress Notes (Signed)
Preliminary results by tech - Venous Duplex Lower Ext. Right Completed. Negative for deep or  superficial vein thrombosis in the right leg. [name], BS, RDMS, RVT

## 2016-04-04 ENCOUNTER — Ambulatory Visit
Admission: RE | Admit: 2016-04-04 | Discharge: 2016-04-04 | Disposition: A | Discharge status: Home / Self Care | Payer: Self-pay | Source: Ambulatory Visit | Attending: Sports Medicine | Admitting: Sports Medicine

## 2016-04-04 DIAGNOSIS — S6992XD Unspecified injury of left wrist, hand and finger(s), subsequent encounter: Secondary | ICD-10-CM

## 2016-04-06 MED FILL — $novoLOG 100 UNITS/ML VIAL: 100 | 28 days supply | Qty: 10 | Fill #1

## 2016-04-06 MED FILL — VENTOLIN HFA 90 MCG INHALER: 108 (90 BAS | 10 days supply | Qty: 18 | Fill #3

## 2016-04-06 MED FILL — ?FUROSEMIDE 40 MG TABLET: 40 | 30 days supply | Qty: 90 | Fill #5

## 2016-04-08 ENCOUNTER — Encounter: Payer: Self-pay | Admitting: *Deleted

## 2016-04-08 NOTE — Progress Notes (Signed)
[facility]  Dr Erlinda Hong Tuesday 04/14/16 at [address], [place], [place] [address] Phone: [phone]

## 2016-04-10 ENCOUNTER — Other Ambulatory Visit: Payer: Self-pay

## 2016-04-10 DIAGNOSIS — E1142 Type 2 diabetes mellitus with diabetic polyneuropathy: Secondary | ICD-10-CM

## 2016-04-10 MED ORDER — PREGABALIN 100 MG PO CAPS: 100.0000 mg | ORAL_CAPSULE | Freq: Three times a day (TID) | ORAL | 3 refills | Status: DC

## 2016-04-10 MED ORDER — GABAPENTIN 100 MG PO CAPS: 100.0000 mg | ORAL_CAPSULE | Freq: Three times a day (TID) | ORAL | 3 refills | Status: DC

## 2016-04-13 MED FILL — ?LISINOPRIL 40 MG TABLET: 40 MG | 30 days supply | Qty: 30 | Fill #4

## 2016-04-13 MED FILL — ATORVASTATIN 20 MG TABLET: 20 | 30 days supply | Qty: 30 | Fill #2

## 2016-04-13 MED FILL — hydrOXYzine HCL 50 MG TABS: 50 | 10 days supply | Qty: 30 | Fill #3

## 2016-04-13 MED FILL — VALACYCLOVIR HCL 500 MG TAB: 500 | 30 days supply | Qty: 30 | Fill #1

## 2016-04-13 MED FILL — hydrALAZINE HCL 25 MG TABS: 25 | 30 days supply | Qty: 90 | Fill #3

## 2016-04-13 MED FILL — GABAPENTIN 100 MG CAPSULE: 100 | 30 days supply | Qty: 90 | Fill #0

## 2016-04-13 MED FILL — CARVEDILOL 25 MG TABLET: 25 | 30 days supply | Qty: 60 | Fill #1

## 2016-04-14 ENCOUNTER — Ambulatory Visit (INDEPENDENT_AMBULATORY_CARE_PROVIDER_SITE_OTHER): Payer: Self-pay | Admitting: Orthopaedic Surgery

## 2016-04-14 DIAGNOSIS — S63602A Unspecified sprain of left thumb, initial encounter: Secondary | ICD-10-CM

## 2016-05-04 ENCOUNTER — Other Ambulatory Visit: Payer: Self-pay | Admitting: Family Medicine

## 2016-05-04 DIAGNOSIS — I5032 Chronic diastolic (congestive) heart failure: Secondary | ICD-10-CM

## 2016-05-04 DIAGNOSIS — Z9109 Other allergy status, other than to drugs and biological substances: Secondary | ICD-10-CM

## 2016-05-04 MED FILL — ?ATORVASTATIN 20 MG TABLET: 20 | 30 days supply | Qty: 30 | Fill #3

## 2016-05-04 MED FILL — hydrOXYzine HCL 50 MG TABS: 50 | 10 days supply | Qty: 30 | Fill #0

## 2016-05-04 MED FILL — POTASSIUM CL ER 20 MEQ TAB: 20 | 30 days supply | Qty: 60 | Fill #0

## 2016-05-04 MED FILL — LISINOPRIL 40 MG TABLET: 40 | 30 days supply | Qty: 30 | Fill #5

## 2016-05-11 ENCOUNTER — Other Ambulatory Visit: Payer: Self-pay | Admitting: Family Medicine

## 2016-05-11 DIAGNOSIS — I5032 Chronic diastolic (congestive) heart failure: Secondary | ICD-10-CM

## 2016-05-11 DIAGNOSIS — E118 Type 2 diabetes mellitus with unspecified complications: Secondary | ICD-10-CM

## 2016-05-11 DIAGNOSIS — E1165 Type 2 diabetes mellitus with hyperglycemia: Secondary | ICD-10-CM

## 2016-05-11 MED FILL — $LANTUS SOLOSTAR 100 UNITS/: 100 | 30 days supply | Qty: 15 | Fill #2

## 2016-05-11 MED FILL — BYETTA 5 MCG DOSE PEN INJ: 5 | 30 days supply | Qty: 1 | Fill #0

## 2016-05-11 MED FILL — ?VALACYCLOVIR HCL 500MG TAB: 500 | 30 days supply | Qty: 30 | Fill #2

## 2016-05-11 MED FILL — GABAPENTIN 100 MG CAPSULE: 100 | 30 days supply | Qty: 90 | Fill #1

## 2016-05-11 MED FILL — FUROSEMIDE 40 MG TABLET: 40 | 30 days supply | Qty: 90 | Fill #0

## 2016-05-11 MED FILL — $novoLOG 100 UNITS/ML VIAL: 100 | 28 days supply | Qty: 10 | Fill #2

## 2016-05-11 MED FILL — CARVEDILOL 25 MG TABLET: 25 | 30 days supply | Qty: 60 | Fill #2

## 2016-05-14 MED FILL — JANUVIA 50 MG TABLET: 50 | 30 days supply | Qty: 30 | Fill #5

## 2016-05-19 ENCOUNTER — Encounter: Payer: Self-pay | Admitting: Family Medicine

## 2016-05-19 ENCOUNTER — Ambulatory Visit: Payer: Self-pay | Attending: Family Medicine | Admitting: Family Medicine

## 2016-05-19 VITALS — BP 133/78 | HR 68 | Temp 98.5°F | Ht 68.0 in | Wt 290.8 lb

## 2016-05-19 DIAGNOSIS — M79642 Pain in left hand: Secondary | ICD-10-CM | POA: Insufficient documentation

## 2016-05-19 DIAGNOSIS — Z23 Encounter for immunization: Secondary | ICD-10-CM

## 2016-05-19 DIAGNOSIS — E1142 Type 2 diabetes mellitus with diabetic polyneuropathy: Secondary | ICD-10-CM | POA: Insufficient documentation

## 2016-05-19 DIAGNOSIS — E1122 Type 2 diabetes mellitus with diabetic chronic kidney disease: Secondary | ICD-10-CM | POA: Insufficient documentation

## 2016-05-19 DIAGNOSIS — I10 Essential (primary) hypertension: Secondary | ICD-10-CM

## 2016-05-19 DIAGNOSIS — I129 Hypertensive chronic kidney disease with stage 1 through stage 4 chronic kidney disease, or unspecified chronic kidney disease: Secondary | ICD-10-CM | POA: Insufficient documentation

## 2016-05-19 DIAGNOSIS — S6992XD Unspecified injury of left wrist, hand and finger(s), subsequent encounter: Secondary | ICD-10-CM

## 2016-05-19 DIAGNOSIS — Z794 Long term (current) use of insulin: Secondary | ICD-10-CM | POA: Insufficient documentation

## 2016-05-19 DIAGNOSIS — Z7982 Long term (current) use of aspirin: Secondary | ICD-10-CM | POA: Insufficient documentation

## 2016-05-19 DIAGNOSIS — Z79899 Other long term (current) drug therapy: Secondary | ICD-10-CM | POA: Insufficient documentation

## 2016-05-19 DIAGNOSIS — N183 Chronic kidney disease, stage 3 unspecified: Secondary | ICD-10-CM

## 2016-05-19 DIAGNOSIS — Z87891 Personal history of nicotine dependence: Secondary | ICD-10-CM | POA: Insufficient documentation

## 2016-05-19 DIAGNOSIS — E1165 Type 2 diabetes mellitus with hyperglycemia: Secondary | ICD-10-CM

## 2016-05-19 DIAGNOSIS — Z124 Encounter for screening for malignant neoplasm of cervix: Secondary | ICD-10-CM

## 2016-05-19 DIAGNOSIS — E118 Type 2 diabetes mellitus with unspecified complications: Secondary | ICD-10-CM

## 2016-05-19 LAB — BASIC METABOLIC PANEL WITH GFR
BUN: 59 mg/dL — ABNORMAL HIGH (ref 7–25)
CO2: 24 mmol/L (ref 20–31)
Calcium: 8.8 mg/dL (ref 8.6–10.4)
Chloride: 105 mmol/L (ref 98–110)
Creat: 1.9 mg/dL — ABNORMAL HIGH (ref 0.50–1.05)
GFR, Est African American: 33 mL/min — ABNORMAL LOW (ref >=60)
GFR, Est Non African American: 29 mL/min — ABNORMAL LOW (ref >=60)
Glucose, Bld: 240 mg/dL — ABNORMAL HIGH (ref 65–99)
Potassium: 4.6 mmol/L (ref 3.5–5.3)
Sodium: 138 mmol/L (ref 135–146)

## 2016-05-19 LAB — POCT GLYCOSYLATED HEMOGLOBIN (HGB A1C): Hemoglobin A1C: 10.7

## 2016-05-19 LAB — GLUCOSE, POCT (MANUAL RESULT ENTRY): POC Glucose: 234 mg/dl — AB (ref 70–99)

## 2016-05-19 MED ORDER — ACETAMINOPHEN-CODEINE #3 300-30 MG PO TABS: 1.0000 | ORAL_TABLET | Freq: Three times a day (TID) | ORAL | 0 refills | Status: DC | PRN

## 2016-05-19 MED ORDER — INSULIN ASPART 100 UNIT/ML FLEXPEN: 10.0000 [IU] | PEN_INJECTOR | Freq: Three times a day (TID) | SUBCUTANEOUS | 3 refills | Status: DC

## 2016-05-19 MED FILL — ACETAMINOPHEN/COD #3 TABLET: 300-30 | 20 days supply | Qty: 60 | Fill #0

## 2016-05-19 NOTE — Progress Notes (Signed)
Pt is here today to follow up on HTN and Diabetes.  Pt is getting flu shot today.

## 2016-05-19 NOTE — Assessment & Plan Note (Addendum)
Tylenol ##3 for pain control Continue PT Continue ortho

## 2016-05-19 NOTE — Patient Instructions (Signed)
[name] was seen today for diabetes and hypertension.  Diagnoses and all orders for this visit:  Uncontrolled type 2 diabetes mellitus with complication, unspecified long term insulin use status (HCC) -     POCT glucose (manual entry) -     POCT glycosylated hemoglobin (Hb A1C) -     insulin aspart (NOVOLOG) 100 UNIT/ML FlexPen; Inject 10 Units into the skin 3 (three) times daily with meals.  Essential hypertension -     BASIC METABOLIC PANEL WITH GFR  Injury of left hand, subsequent encounter -     Ambulatory referral to Gynecology  Diabetic polyneuropathy associated with type 2 diabetes mellitus ([place]) -     acetaminophen-codeine (TYLENOL #3) 300-30 MG tablet; Take 1 tablet by mouth every 8 (eight) hours as needed.  Pap smear for cervical cancer screening -     Ambulatory referral to Gynecology  CKD (chronic kidney disease) stage 3, GFR 30-59 ml/min -     BASIC METABOLIC PANEL WITH GFR   Check and write down your bloods sugars before eating and taking novolog   Diabetes blood sugar goals  Fasting (in AM before breakfast, 8 hrs of no eating or drinking (except water or unsweetened coffee or tea): 90-110 2 hrs after meals: < 160,   No low sugars: nothing < 70    If your renal function has improved the [place] will be increased to 100 mg daily  Detailed letter to return to gym will be mailed to you   F/u in 6 weeks for diabetes   Dr. Adrian Blackwater

## 2016-05-19 NOTE — Assessment & Plan Note (Signed)
Improving control Plan Increased novolog 10 U TID Continue all other meds the same If GFR improves to > 50 then increase januvia to 100 mg daily

## 2016-05-19 NOTE — Assessment & Plan Note (Signed)
Controlled. Continue current regimen. 

## 2016-05-19 NOTE — Progress Notes (Signed)
Subjective:  Patient ID: [name], female    DOB: 12-08-1955  Age: 60 y.o. MRN: [id number]  CC: Diabetes and Hypertension   HPI [name] has diabetes, HTN, CKD stage 3, obesity, diastolic CHF, OSA  presents for    1. CHRONIC DIABETES  Disease Monitoring  Blood Sugar Ranges:    Fasting: not checking   Post prandial 189-220  Polyuria: yes, but also taking lasix   Visual problems: no  Medication Compliance: yes, has tried to increase [place] to 100 mg daily which improved her blood sugar. Has not increased novololg to the 10 U TID dose  Medication Side Effects  Hypoglycemia: no   Preventitive Health Care  Eye Exam: due, uninsured and has not applied for   Foot Exam: done today   Diet pattern: not eating breakfast most of the time,usually eat at 1 PM   Exercise: none, she needs a more detailed letter for her gym in order to be allowed to go back.   2. HTN: compliant with regimen. No HA, CP, SOB or  Edema. She is not exercising.   3. L hand pain: had injury to left hand when a freezer door fell on her hand in July, 2017 at [employer]. She was treated in ED x 2, then sports medicine.  She had no improvement with conservative management, so MR of L hand was done and revealed: partial tear of the ulnar collateral ligament of the first MCP joint from the proximal phalanx of the thumb.   She is currently under the care of ortho and participating in physical therapy. She is wearing a brace. She still has pain with use of her hand. She is not taking pain medicine.   Social History  Substance Use Topics  . Smoking status: Former Smoker    Quit date: 10/25/1980  . Smokeless tobacco: Never Used     Comment: quit smoking in 1982  . Alcohol use Yes     Comment: 3 times in last year    Outpatient Medications Prior to Visit  Medication Sig Dispense Refill  . acetaminophen-codeine (TYLENOL #3) 300-30 MG tablet TAKE 1 TABLET BY MOUTH EVERY 8 HOURS AS NEEDED. 60 tablet 0  .  albuterol (PROAIR HFA) 108 (90 BASE) MCG/ACT inhaler Inhale 2 puffs into the lungs every 4 (four) hours as needed for wheezing. 3 Inhaler 3  . albuterol (PROVENTIL) (2.5 MG/3ML) 0.083% nebulizer solution Take 3 mLs (2.5 mg total) by nebulization every 6 (six) hours as needed for wheezing or shortness of breath. 150 mL 1  . aspirin 325 MG tablet Take 1 tablet (325 mg total) by mouth daily. 30 tablet 3  . atorvastatin (LIPITOR) 20 MG tablet TAKE 1 TABLET BY MOUTH DAILY 30 tablet 3  . Blood Glucose Monitoring Suppl (TRUE METRIX METER) w/Device KIT 1 each by Does not apply route as needed. 1 kit 0  . BYETTA 5 MCG PEN 5 MCG/0.02ML SOPN injection INJECT 5 MCG INTO THE SKIN 2 TIMES DAILY WITH A MEAL. 1.2 mL 3  . carvedilol (COREG) 25 MG tablet TAKE 1 TABLET BY MOUTH 2 TIMES DAILY WITH A MEAL. 60 tablet 2  . cephALEXin (KEFLEX) 500 MG capsule Take 1 capsule (500 mg total) by mouth 4 (four) times daily. 28 capsule 0  . diclofenac sodium (VOLTAREN) 1 % GEL Apply 4 g topically daily as needed (for pain). 100 g 2  . fluticasone (FLONASE) 50 MCG/ACT nasal spray Place 2 sprays into both nostrils daily. (  Patient taking differently: Place 2 sprays into both nostrils daily as needed for allergies. ) 16 g 1  . Fluticasone-Salmeterol (ADVAIR DISKUS) 250-50 MCG/DOSE AEPB Inhale 1 puff into the lungs 2 (two) times daily. 60 each 1  . furosemide (LASIX) 40 MG tablet TAKE 2 TABLETS AT 8 AM AND 1 TABLET AT 2 PM 90 tablet 2  . gabapentin (NEURONTIN) 100 MG capsule Take 1 capsule (100 mg total) by mouth 3 (three) times daily. 90 capsule 3  . glucose blood (TRUE METRIX BLOOD GLUCOSE TEST) test strip 1 each by Other route 3 (three) times daily. 100 each 11  . hydrALAZINE (APRESOLINE) 25 MG tablet Take 1 tablet (25 mg total) by mouth 3 (three) times daily. 90 tablet 3  . HYDROcodone-acetaminophen (NORCO/VICODIN) 5-325 MG tablet Take 1-2 tablets by mouth every 4 (four) hours as needed for moderate pain or severe pain. 10 tablet 0    . hydrOXYzine (ATARAX/VISTARIL) 50 MG tablet TAKE 1 TABLET BY MOUTH 3 TIMES DAILY AS NEEDED. 30 tablet 0  . ibuprofen (ADVIL,MOTRIN) 800 MG tablet Take 1 tablet (800 mg total) by mouth 3 (three) times daily. 21 tablet 0  . insulin aspart (NOVOLOG) 100 UNIT/ML FlexPen 5 Units 3 times daily with meals for one week, then increase to 10 Units TID 15 mL 3  . Insulin Glargine (LANTUS SOLOSTAR) 100 UNIT/ML Solostar Pen Inject 50 Units into the skin daily at 10 pm. 5 pen 6  . Insulin Pen Needle (PX SHORTLENGTH PEN NEEDLES) 31G X 8 MM MISC 1 each by Does not apply route 4 (four) times daily. 120 each 11  . ketotifen (ZADITOR) 0.025 % ophthalmic solution Place 1 drop into both eyes 2 (two) times daily. (Patient taking differently: Place 1 drop into both eyes daily as needed (for dry eyes). ) 10 mL 1  . LANTUS SOLOSTAR 100 UNIT/ML Solostar Pen INJECT 50 UNITS INTO THE SKIN DAILY AT 10 PM. 15 mL 2  . lisinopril (PRINIVIL,ZESTRIL) 40 MG tablet Take 1 tablet (40 mg total) by mouth daily. 30 tablet 5  . naproxen (NAPROSYN) 500 MG tablet Take 1 tablet (500 mg total) by mouth 2 (two) times daily. 30 tablet 0  . ondansetron (ZOFRAN) 8 MG tablet Take 1 tablet (8 mg total) by mouth every 8 (eight) hours as needed for nausea or vomiting. 20 tablet 0  . potassium chloride SA (K-DUR,KLOR-CON) 20 MEQ tablet TAKE 1 TABLET BY MOUTH TWICE DAILY WITH LASIX 60 tablet 0  . pregabalin (LYRICA) 100 MG capsule Take 1 capsule (100 mg total) by mouth 3 (three) times daily. 270 capsule 3  . ranitidine (ZANTAC) 150 MG tablet TAKE 1 TABLET BY MOUTH 2 TIMES DAILY. 60 tablet 3  . sitaGLIPtin (JANUVIA) 50 MG tablet Take 1 tablet (50 mg total) by mouth daily. 90 tablet 3  . valACYclovir (VALTREX) 500 MG tablet TAKE 1 TABLET BY MOUTH DAILY. 30 tablet 2  . cyclobenzaprine (FLEXERIL) 10 MG tablet Take 1 tablet (10 mg total) by mouth 2 (two) times daily as needed for muscle spasms. (Patient not taking: Reported on 05/19/2016) 20 tablet 0   No  facility-administered medications prior to visit.     ROS Review of Systems  Constitutional: Negative for chills and fever.  Eyes: Positive for visual disturbance.  Respiratory: Negative for shortness of breath.   Cardiovascular: Negative for chest pain.  Gastrointestinal: Negative for abdominal pain, blood in stool, nausea and vomiting.  Musculoskeletal: Positive for arthralgias (L hand pain ) and myalgias.  Negative for back pain.  Skin: Negative for rash.  Allergic/Immunologic: Negative for immunocompromised state.  Neurological: Positive for numbness (and tingling in hands and feet ). Negative for dizziness and headaches.  Hematological: Negative for adenopathy. Does not bruise/bleed easily.  Psychiatric/Behavioral: Negative for dysphoric mood and suicidal ideas.    Objective:  BP 133/78 (BP Location: Left Arm, Patient Position: Sitting, Cuff Size: Large)   Pulse 68   Temp 98.5 F (36.9 C) (Oral)   Ht _0  (1.727 m)   Wt 290 lb 12.8 oz (131.9 kg)   SpO2 97%   BMI 44.22 kg/m   BP/Weight 05/19/2016 04/01/2016 2/66/9167  Systolic BP 561 254 832  Diastolic BP 78 65 65  Wt. (Lbs) 290.8 290 290  BMI 44.22 44.09 44.09   Physical Exam  Constitutional: She is oriented to person, place, and time. She appears well-developed and well-nourished. No distress.  HENT:  Head: Normocephalic and atraumatic.  Cardiovascular: Normal rate, regular rhythm, normal heart sounds and intact distal pulses.   Pulmonary/Chest: Effort normal and breath sounds normal.  Musculoskeletal: She exhibits no edema.       Hands: Neurological: She is alert and oriented to person, place, and time. She displays normal reflexes. No cranial nerve deficit. She exhibits normal muscle tone. Coordination normal.  Skin: Skin is warm and dry. No rash noted.  Psychiatric: She has a normal mood and affect.   Lab Results  Component Value Date   HGBA1C 10.7 05/19/2016   CBG 234   Assessment & Plan:   [name] was  seen today for diabetes and hypertension.  Diagnoses and all orders for this visit:  Uncontrolled type 2 diabetes mellitus with complication, unspecified long term insulin use status (HCC) -     POCT glucose (manual entry) -     POCT glycosylated hemoglobin (Hb A1C) -     insulin aspart (NOVOLOG) 100 UNIT/ML FlexPen; Inject 10 Units into the skin 3 (three) times daily with meals.  Essential hypertension -     BASIC METABOLIC PANEL WITH GFR  Injury of left hand, subsequent encounter -     Ambulatory referral to Gynecology  Diabetic polyneuropathy associated with type 2 diabetes mellitus ([place]) -     acetaminophen-codeine (TYLENOL #3) 300-30 MG tablet; Take 1 tablet by mouth every 8 (eight) hours as needed.  Pap smear for cervical cancer screening -     Ambulatory referral to Gynecology  CKD (chronic kidney disease) stage 3, GFR 30-59 ml/min -     BASIC METABOLIC PANEL WITH GFR  Encounter for immunization -     Flu Vaccine QUAD 36+ mos IM    No orders of the defined types were placed in this encounter.   Follow-up: No Follow-up on file.   Boykin Nearing MD

## 2016-05-19 NOTE — Assessment & Plan Note (Signed)
Checking BMP with GFR Patient advised to continue januvia 50 mg based on current GFR

## 2016-05-26 ENCOUNTER — Ambulatory Visit (INDEPENDENT_AMBULATORY_CARE_PROVIDER_SITE_OTHER): Payer: Self-pay | Admitting: Orthopaedic Surgery

## 2016-05-27 ENCOUNTER — Telehealth: Payer: Self-pay | Admitting: Family Medicine

## 2016-05-27 NOTE — Telephone Encounter (Signed)
Patient called the office to speak with nurse regarding her lab results. Please follow up. ° °Thank you.  °

## 2016-05-27 NOTE — Telephone Encounter (Signed)
Pt was called again to inform pt of lab results. If pt calls back please inform EKI:YJGZ sugar  Continue CKD  Patient's renal function has not improved  She will need to continue 50 mg of januvia  Tight BP and blood sugar control to prevent decline in kidney function  Be sure to avoid NSAIDs- ibuprogen, aleve, mobic and diclofenac  Close f/u needed to check kidney function

## 2016-06-01 ENCOUNTER — Other Ambulatory Visit: Payer: Self-pay | Admitting: Family Medicine

## 2016-06-01 DIAGNOSIS — A6000 Herpesviral infection of urogenital system, unspecified: Secondary | ICD-10-CM

## 2016-06-01 DIAGNOSIS — Z9109 Other allergy status, other than to drugs and biological substances: Secondary | ICD-10-CM

## 2016-06-03 ENCOUNTER — Telehealth: Payer: Self-pay | Admitting: Family Medicine

## 2016-06-03 NOTE — Telephone Encounter (Signed)
Patient called the office to speak with nurse. She has information that she wants nurse to give to PCP. She didn't want to give me any details. Please follow up.   Thank you.

## 2016-06-03 NOTE — Telephone Encounter (Signed)
Pt was called and informed of lab results. Pt states that she is waiting on a letter from you to take to [facility].

## 2016-06-03 NOTE — Telephone Encounter (Signed)
Letter written Ready for printing I will be able to provide a signed copy next week

## 2016-06-11 NOTE — Telephone Encounter (Signed)
Pt was called and informed of letter being ready for pick up.

## 2016-06-11 NOTE — Telephone Encounter (Signed)
Signed copy of letter ready for pick up

## 2016-06-12 ENCOUNTER — Other Ambulatory Visit: Payer: Self-pay | Admitting: Family Medicine

## 2016-06-12 DIAGNOSIS — I10 Essential (primary) hypertension: Secondary | ICD-10-CM

## 2016-06-12 DIAGNOSIS — I5032 Chronic diastolic (congestive) heart failure: Secondary | ICD-10-CM

## 2016-06-12 DIAGNOSIS — J4530 Mild persistent asthma, uncomplicated: Secondary | ICD-10-CM

## 2016-06-12 DIAGNOSIS — I5033 Acute on chronic diastolic (congestive) heart failure: Secondary | ICD-10-CM

## 2016-06-12 DIAGNOSIS — E1142 Type 2 diabetes mellitus with diabetic polyneuropathy: Secondary | ICD-10-CM

## 2016-06-12 MED FILL — BYETTA 5 MCG DOSE PEN INJ: 5 | 30 days supply | Qty: 1 | Fill #1

## 2016-06-12 MED FILL — hydrOXYzine HCL 50 MG TABS: 50 | 10 days supply | Qty: 30 | Fill #0

## 2016-06-12 MED FILL — ATORVASTATIN 20 MG TABLET: 20 | 30 days supply | Qty: 30 | Fill #0

## 2016-06-12 MED FILL — hydrALAZINE HCL 25 MG TABS: 25 | 30 days supply | Qty: 90 | Fill #0

## 2016-06-12 MED FILL — FUROSEMIDE 40 MG TABLET: 40 | 30 days supply | Qty: 90 | Fill #1

## 2016-06-12 MED FILL — JANUVIA 50 MG TABLET: 50 | 30 days supply | Qty: 30 | Fill #6

## 2016-06-12 MED FILL — VENTOLIN HFA 90 MCG INHALER: 108 (90 BAS | 25 days supply | Qty: 18 | Fill #0

## 2016-06-12 MED FILL — GABAPENTIN 100 MG CAPSULE: 100 | 30 days supply | Qty: 90 | Fill #2

## 2016-06-12 MED FILL — POTASSIUM CL ER 20 MEQ TAB: 20 | 30 days supply | Qty: 60 | Fill #0

## 2016-06-12 MED FILL — LISINOPRIL 40 MG TABLET: 40 | 30 days supply | Qty: 30 | Fill #1

## 2016-06-12 MED FILL — $LANTUS SOLOSTAR 100 UNITS/: 100 | 30 days supply | Qty: 15 | Fill #3

## 2016-06-12 MED FILL — CARVEDILOL 25 MG TABLET: 25 | 30 days supply | Qty: 60 | Fill #0

## 2016-06-12 MED FILL — ?VALACYCLOVIR HCL 500MG TAB: 500 | 30 days supply | Qty: 30 | Fill #0

## 2016-07-14 ENCOUNTER — Other Ambulatory Visit: Payer: Self-pay | Admitting: Family Medicine

## 2016-07-14 DIAGNOSIS — A6000 Herpesviral infection of urogenital system, unspecified: Secondary | ICD-10-CM

## 2016-07-14 MED FILL — $LANTUS SOLOSTAR 100 UNITS/: 100 | 30 days supply | Qty: 15 | Fill #4

## 2016-07-14 MED FILL — ATORVASTATIN 20 MG TABLET: 20 | 30 days supply | Qty: 30 | Fill #1

## 2016-07-14 MED FILL — LISINOPRIL 40 MG TABLET: 40 | 30 days supply | Qty: 30 | Fill #2

## 2016-07-15 ENCOUNTER — Other Ambulatory Visit: Payer: Self-pay | Admitting: Family Medicine

## 2016-07-15 DIAGNOSIS — Z9109 Other allergy status, other than to drugs and biological substances: Secondary | ICD-10-CM

## 2016-07-15 MED FILL — ?VALACYCLOVIR HCL 500MG TAB: 500 | 30 days supply | Qty: 30 | Fill #0

## 2016-07-15 MED FILL — hydrOXYzine HCL 50 MG TABS: 50 | 10 days supply | Qty: 30 | Fill #0

## 2016-07-20 MED FILL — GABAPENTIN 100 MG CAPSULE: 100 | 30 days supply | Qty: 90 | Fill #3

## 2016-07-20 MED FILL — ?FUROSEMIDE 40 MG TABLET: 40 | 30 days supply | Qty: 90 | Fill #2

## 2016-07-20 MED FILL — POTASSIUM CL ER 20 MEQ TAB: 20 | 30 days supply | Qty: 60 | Fill #1

## 2016-07-20 MED FILL — hydrALAZINE HCL 25 MG TABS: 25 | 30 days supply | Qty: 90 | Fill #1

## 2016-07-20 MED FILL — $novoLOG 100 UNITS/ML VIAL: 100 | 28 days supply | Qty: 10 | Fill #3

## 2016-07-20 MED FILL — CARVEDILOL 25 MG TABLET: 25 | 30 days supply | Qty: 60 | Fill #1

## 2016-07-24 MED FILL — BYETTA 5 MCG DOSE PEN INJ: 5 | 30 days supply | Qty: 1 | Fill #2

## 2016-07-27 ENCOUNTER — Other Ambulatory Visit: Payer: Self-pay | Admitting: Family Medicine

## 2016-07-27 ENCOUNTER — Encounter: Payer: Self-pay | Admitting: Family Medicine

## 2016-07-27 ENCOUNTER — Ambulatory Visit: Payer: Self-pay | Attending: Family Medicine | Admitting: Family Medicine

## 2016-07-27 ENCOUNTER — Ambulatory Visit: Payer: Self-pay | Attending: Family Medicine

## 2016-07-27 VITALS — BP 145/82 | HR 69 | Temp 98.3°F | Ht 68.0 in | Wt 292.2 lb

## 2016-07-27 DIAGNOSIS — E1142 Type 2 diabetes mellitus with diabetic polyneuropathy: Secondary | ICD-10-CM | POA: Insufficient documentation

## 2016-07-27 DIAGNOSIS — J302 Other seasonal allergic rhinitis: Secondary | ICD-10-CM | POA: Insufficient documentation

## 2016-07-27 DIAGNOSIS — N183 Chronic kidney disease, stage 3 unspecified: Secondary | ICD-10-CM

## 2016-07-27 DIAGNOSIS — I130 Hypertensive heart and chronic kidney disease with heart failure and stage 1 through stage 4 chronic kidney disease, or unspecified chronic kidney disease: Secondary | ICD-10-CM | POA: Insufficient documentation

## 2016-07-27 DIAGNOSIS — Z7982 Long term (current) use of aspirin: Secondary | ICD-10-CM | POA: Insufficient documentation

## 2016-07-27 DIAGNOSIS — M25572 Pain in left ankle and joints of left foot: Secondary | ICD-10-CM | POA: Insufficient documentation

## 2016-07-27 DIAGNOSIS — E118 Type 2 diabetes mellitus with unspecified complications: Secondary | ICD-10-CM

## 2016-07-27 DIAGNOSIS — N189 Chronic kidney disease, unspecified: Secondary | ICD-10-CM | POA: Insufficient documentation

## 2016-07-27 DIAGNOSIS — Z0000 Encounter for general adult medical examination without abnormal findings: Secondary | ICD-10-CM

## 2016-07-27 DIAGNOSIS — E1165 Type 2 diabetes mellitus with hyperglycemia: Secondary | ICD-10-CM

## 2016-07-27 DIAGNOSIS — Z6841 Body Mass Index (BMI) 40.0 and over, adult: Secondary | ICD-10-CM | POA: Insufficient documentation

## 2016-07-27 DIAGNOSIS — Z794 Long term (current) use of insulin: Secondary | ICD-10-CM | POA: Insufficient documentation

## 2016-07-27 DIAGNOSIS — J4530 Mild persistent asthma, uncomplicated: Secondary | ICD-10-CM | POA: Insufficient documentation

## 2016-07-27 DIAGNOSIS — Z9109 Other allergy status, other than to drugs and biological substances: Secondary | ICD-10-CM

## 2016-07-27 DIAGNOSIS — D631 Anemia in chronic kidney disease: Secondary | ICD-10-CM

## 2016-07-27 DIAGNOSIS — Z87891 Personal history of nicotine dependence: Secondary | ICD-10-CM | POA: Insufficient documentation

## 2016-07-27 DIAGNOSIS — I5032 Chronic diastolic (congestive) heart failure: Secondary | ICD-10-CM | POA: Insufficient documentation

## 2016-07-27 DIAGNOSIS — E1122 Type 2 diabetes mellitus with diabetic chronic kidney disease: Secondary | ICD-10-CM | POA: Insufficient documentation

## 2016-07-27 DIAGNOSIS — G4733 Obstructive sleep apnea (adult) (pediatric): Secondary | ICD-10-CM | POA: Insufficient documentation

## 2016-07-27 DIAGNOSIS — F329 Major depressive disorder, single episode, unspecified: Secondary | ICD-10-CM | POA: Insufficient documentation

## 2016-07-27 DIAGNOSIS — Z79899 Other long term (current) drug therapy: Secondary | ICD-10-CM | POA: Insufficient documentation

## 2016-07-27 DIAGNOSIS — K219 Gastro-esophageal reflux disease without esophagitis: Secondary | ICD-10-CM | POA: Insufficient documentation

## 2016-07-27 DIAGNOSIS — Z7951 Long term (current) use of inhaled steroids: Secondary | ICD-10-CM | POA: Insufficient documentation

## 2016-07-27 LAB — CBC
HCT: 31.1 % — ABNORMAL LOW (ref 35.0–45.0)
Hemoglobin: 9.8 g/dL — ABNORMAL LOW (ref 11.7–15.5)
MCH: 28.1 pg (ref 27.0–33.0)
MCHC: 31.5 g/dL — ABNORMAL LOW (ref 32.0–36.0)
MCV: 89.1 fL (ref 80.0–100.0)
MPV: 11.2 fL (ref 7.5–12.5)
Platelets: 205 10*3/uL (ref 140–400)
RBC: 3.49 MIL/uL — ABNORMAL LOW (ref 3.80–5.10)
RDW: 15 % (ref 11.0–15.0)
WBC: 6.7 10*3/uL (ref 3.8–10.8)

## 2016-07-27 LAB — COMPLETE METABOLIC PANEL WITH GFR
ALT: 16 U/L (ref 6–29)
AST: 14 U/L (ref 10–35)
Albumin: 3.6 g/dL (ref 3.6–5.1)
Alkaline Phosphatase: 87 U/L (ref 33–130)
BUN: 62 mg/dL — ABNORMAL HIGH (ref 7–25)
CO2: 22 mmol/L (ref 20–31)
Calcium: 8.6 mg/dL (ref 8.6–10.4)
Chloride: 104 mmol/L (ref 98–110)
Creat: 2.02 mg/dL — ABNORMAL HIGH (ref 0.50–1.05)
GFR, Est African American: 31 mL/min — ABNORMAL LOW (ref >=60)
GFR, Est Non African American: 27 mL/min — ABNORMAL LOW (ref >=60)
Glucose, Bld: 303 mg/dL — ABNORMAL HIGH (ref 65–99)
Potassium: 4.6 mmol/L (ref 3.5–5.3)
Sodium: 136 mmol/L (ref 135–146)
Total Bilirubin: 0.3 mg/dL (ref 0.2–1.2)
Total Protein: 7 g/dL (ref 6.1–8.1)

## 2016-07-27 LAB — GLUCOSE, POCT (MANUAL RESULT ENTRY)
POC Glucose: 253 mg/dl — AB (ref 70–99)
POC Glucose: 272 mg/dl — AB (ref 70–99)

## 2016-07-27 MED ORDER — INSULIN SYRINGES (DISPOSABLE) U-100 1 ML MISC
1.0000 | Freq: Every day | 11 refills | Status: DC
Start: 2016-07-27 — End: 2016-09-03

## 2016-07-27 MED ORDER — HYDROXYZINE HCL 50 MG PO TABS: 50.0000 mg | ORAL_TABLET | Freq: Three times a day (TID) | ORAL | 3 refills | Status: DC | PRN

## 2016-07-27 MED ORDER — INSULIN PEN NEEDLE 31G X 8 MM MISC: 1.0000 | Freq: Every day | 11 refills | Status: DC

## 2016-07-27 MED ORDER — ALBUTEROL SULFATE HFA 108 (90 BASE) MCG/ACT IN AERS
2.0000 | INHALATION_SPRAY | RESPIRATORY_TRACT | 5 refills | Status: DC | PRN
Start: 2016-07-27 — End: 2016-08-03

## 2016-07-27 MED ORDER — RANITIDINE HCL 150 MG PO TABS: 150.0000 mg | ORAL_TABLET | Freq: Two times a day (BID) | ORAL | 3 refills | Status: DC

## 2016-07-27 MED ORDER — ACETAMINOPHEN-CODEINE #3 300-30 MG PO TABS: 1.0000 | ORAL_TABLET | Freq: Three times a day (TID) | ORAL | 2 refills | Status: DC | PRN

## 2016-07-27 MED ORDER — KETOTIFEN FUMARATE 0.025 % OP SOLN
1.0000 [drp] | Freq: Every day | OPHTHALMIC | 2 refills | Status: DC | PRN
Start: 2016-07-27 — End: 2017-09-01

## 2016-07-27 MED ORDER — INSULIN ASPART 100 UNIT/ML ~~LOC~~ SOLN
10.0000 [IU] | Freq: Once | SUBCUTANEOUS | Status: AC
  Administered 2016-07-27: 10 [IU] via SUBCUTANEOUS

## 2016-07-27 MED ORDER — PREGABALIN 100 MG PO CAPS: 100.0000 mg | ORAL_CAPSULE | Freq: Three times a day (TID) | ORAL | 3 refills | Status: DC

## 2016-07-27 MED FILL — raNITIdine HCL 150 MG TABS: 150 | 30 days supply | Qty: 60 | Fill #0

## 2016-07-27 MED FILL — SM EYE ITCH RELIEF 0.025% D: 0.025 | 20 days supply | Qty: 10 | Fill #0

## 2016-07-27 MED FILL — hydrOXYzine HCL 50 MG TABS: 50 | 30 days supply | Qty: 90 | Fill #0

## 2016-07-27 MED FILL — !VENTOLIN HFA INHALER: 108 (90 BAS | 30 days supply | Qty: 18 | Fill #0

## 2016-07-27 MED FILL — ACETAMINOPHEN/COD #3 TABLET: 300-30 | 30 days supply | Qty: 90 | Fill #0

## 2016-07-27 NOTE — Progress Notes (Signed)
Subjective:  Patient ID: [name], female    DOB: 28-Aug-1955  Age: 61 y.o. MRN: [id number]  CC: Foot Pain and Diabetes   HPI Jerie M Vien has HTN, DM2, CKD, morbid obesity, OSA, depression, chronic diastolic CHF she presents for    1. L foot pain: x 3 weeks. Pain and swelling. No injury. No redness. Pain was severe when it started and would wake her up at night. She elevated and soaked in hot water which offered slight relief. The more she walks on it the worse it gets. She tried ibuprofen without improvement. Tylenol #3 helps, she request refill.   2. CHRONIC DIABETES  Disease Monitoring  Blood Sugar Ranges: not checking   Polyuria: no   Visual problems: no   Medication Compliance: no  Medication Side Effects  Hypoglycemia: no   Preventitive Health Care  Eye Exam: due   Foot Exam: done today   Diet pattern: overeating, high sugar   Exercise: none   Social History  Substance Use Topics  . Smoking status: Former Smoker    Quit date: 10/25/1980  . Smokeless tobacco: Never Used     Comment: quit smoking in 1982  . Alcohol use Yes     Comment: 3 times in last year    Outpatient Medications Prior to Visit  Medication Sig Dispense Refill  . acetaminophen-codeine (TYLENOL #3) 300-30 MG tablet Take 1 tablet by mouth every 8 (eight) hours as needed. 60 tablet 0  . albuterol (PROVENTIL) (2.5 MG/3ML) 0.083% nebulizer solution Take 3 mLs (2.5 mg total) by nebulization every 6 (six) hours as needed for wheezing or shortness of breath. 150 mL 1  . aspirin 325 MG tablet Take 1 tablet (325 mg total) by mouth daily. 30 tablet 3  . atorvastatin (LIPITOR) 20 MG tablet TAKE 1 TABLET BY MOUTH DAILY 30 tablet 3  . Blood Glucose Monitoring Suppl (TRUE METRIX METER) w/Device KIT 1 each by Does not apply route as needed. 1 kit 0  . BYETTA 5 MCG PEN 5 MCG/0.02ML SOPN injection INJECT 5 MCG INTO THE SKIN 2 TIMES DAILY WITH A MEAL. 1.2 mL 3  . carvedilol (COREG) 25 MG tablet TAKE 1 TABLET  BY MOUTH 2 TIMES DAILY WITH A MEAL. 60 tablet 2  . diclofenac sodium (VOLTAREN) 1 % GEL Apply 4 g topically daily as needed (for pain). 100 g 2  . fluticasone (FLONASE) 50 MCG/ACT nasal spray Place 2 sprays into both nostrils daily. (Patient taking differently: Place 2 sprays into both nostrils daily as needed for allergies. ) 16 g 1  . Fluticasone-Salmeterol (ADVAIR DISKUS) 250-50 MCG/DOSE AEPB Inhale 1 puff into the lungs 2 (two) times daily. 60 each 1  . furosemide (LASIX) 40 MG tablet TAKE 2 TABLETS AT 8 AM AND 1 TABLET AT 2 PM 90 tablet 2  . glucose blood (TRUE METRIX BLOOD GLUCOSE TEST) test strip 1 each by Other route 3 (three) times daily. 100 each 11  . hydrALAZINE (APRESOLINE) 25 MG tablet TAKE 1 TABLET BY MOUTH 3 TIMES DAILY. 90 tablet 3  . hydrOXYzine (ATARAX/VISTARIL) 50 MG tablet TAKE 1 TABLET BY MOUTH 3 TIMES DAILY AS NEEDED. 30 tablet 0  . ibuprofen (ADVIL,MOTRIN) 800 MG tablet Take 1 tablet (800 mg total) by mouth 3 (three) times daily. 21 tablet 0  . insulin aspart (NOVOLOG) 100 UNIT/ML FlexPen Inject 10 Units into the skin 3 (three) times daily with meals. 15 mL 3  . Insulin Pen Needle ([place] PEN  NEEDLES) 31G X 8 MM MISC 1 each by Does not apply route 4 (four) times daily. 120 each 11  . ketotifen (ZADITOR) 0.025 % ophthalmic solution Place 1 drop into both eyes 2 (two) times daily. (Patient taking differently: Place 1 drop into both eyes daily as needed (for dry eyes). ) 10 mL 1  . LANTUS SOLOSTAR 100 UNIT/ML Solostar Pen INJECT 50 UNITS INTO THE SKIN DAILY AT 10 PM. 15 mL 2  . lisinopril (PRINIVIL,ZESTRIL) 40 MG tablet Take 1 tablet (40 mg total) by mouth daily. 30 tablet 5  . ondansetron (ZOFRAN) 8 MG tablet Take 1 tablet (8 mg total) by mouth every 8 (eight) hours as needed for nausea or vomiting. 20 tablet 0  . Potassium Chloride ER 20 MEQ TBCR Take 1 tablet by mouth twice daily with Lasix. 60 tablet 2  . pregabalin (LYRICA) 100 MG capsule Take 1 capsule (100 mg  total) by mouth 3 (three) times daily. 270 capsule 3  . ranitidine (ZANTAC) 150 MG tablet TAKE 1 TABLET BY MOUTH 2 TIMES DAILY. 60 tablet 3  . sitaGLIPtin (JANUVIA) 50 MG tablet Take 1 tablet (50 mg total) by mouth daily. 90 tablet 3  . valACYclovir (VALTREX) 500 MG tablet TAKE 1 TABLET BY MOUTH DAILY. 30 tablet 0  . VENTOLIN HFA 108 (90 Base) MCG/ACT inhaler INHALE 2 PUFFS INTO THE LUNGS EVERY 4 HOURS AS NEEDED FOR WHEEZING. 54 g 0   No facility-administered medications prior to visit.     ROS Review of Systems  Constitutional: Negative for chills and fever.  Eyes: Negative for visual disturbance.  Respiratory: Negative for shortness of breath.   Cardiovascular: Negative for chest pain.  Gastrointestinal: Negative for abdominal pain, blood in stool, nausea and vomiting.  Musculoskeletal: Positive for myalgias. Negative for arthralgias and back pain.  Skin: Negative for rash.  Allergic/Immunologic: Negative for immunocompromised state.  Neurological: Positive for numbness (and tingling in hands and feet ). Negative for dizziness and headaches.  Hematological: Negative for adenopathy. Does not bruise/bleed easily.  Psychiatric/Behavioral: Negative for dysphoric mood and suicidal ideas.    Objective:  BP (!) 145/82 (BP Location: Left Arm, Patient Position: Sitting, Cuff Size: Large)   Pulse 69   Temp 98.3 F (36.8 C) (Oral)   Ht '5\' 8"'$  (1.727 m)   Wt 292 lb 3.2 oz (132.5 kg)   SpO2 97%   BMI 44.43 kg/m   BP/Weight 07/27/2016 05/19/2016 6/60/6301  Systolic BP 601 093 235  Diastolic BP 82 78 65  Wt. (Lbs) 292.2 290.8 290  BMI 44.43 44.22 44.09    Physical Exam  Constitutional: She is oriented to person, place, and time. She appears well-developed and well-nourished. No distress.  HENT:  Head: Normocephalic and atraumatic.    Cardiovascular: Normal rate, regular rhythm, normal heart sounds and intact distal pulses.   Pulmonary/Chest: Effort normal and breath sounds normal.    Musculoskeletal: She exhibits edema (trace b/l LE ).       Feet:  Neurological: She is alert and oriented to person, place, and time.  Skin: Skin is warm and dry. No rash noted.  Psychiatric: Her affect is blunt. She exhibits a depressed mood.   Lab Results  Component Value Date   HGBA1C 10.7 05/19/2016   CBG 253  Treated with 10 U of novolog  Repeat CBG 272  Assessment & Plan:  Kajuana was seen today for foot pain and diabetes.  Diagnoses and all orders for this visit:  Uncontrolled type 2  diabetes mellitus with complication, unspecified long term insulin use status (HCC) -     POCT glucose (manual entry) -     insulin aspart (novoLOG) injection 10 Units; Inject 0.1 mLs (10 Units total) into the skin once. -     POCT glucose (manual entry) -     Discontinue: Insulin Pen Needle (PX SHORTLENGTH PEN NEEDLES) 31G X 8 MM MISC; 1 each by Does not apply route 6 (six) times daily. -     Insulin Pen Needle (PX SHORTLENGTH PEN NEEDLES) 31G X 8 MM MISC; 1 each by Does not apply route 6 (six) times daily.  Mild persistent asthma without complication -     albuterol (VENTOLIN HFA) 108 (90 Base) MCG/ACT inhaler; Inhale 2 puffs into the lungs every 4 (four) hours as needed for wheezing or shortness of breath.  Diabetic polyneuropathy associated with type 2 diabetes mellitus (HCC) -     pregabalin (LYRICA) 100 MG capsule; Take 1 capsule (100 mg total) by mouth 3 (three) times daily. -     acetaminophen-codeine (TYLENOL #3) 300-30 MG tablet; Take 1 tablet by mouth every 8 (eight) hours as needed.  Environmental allergies -     ketotifen (ZADITOR) 0.025 % ophthalmic solution; Place 1 drop into both eyes daily as needed (for dry eyes). -     hydrOXYzine (ATARAX/VISTARIL) 50 MG tablet; Take 1 tablet (50 mg total) by mouth 3 (three) times daily as needed.  Gastroesophageal reflux disease without esophagitis -     ranitidine (ZANTAC) 150 MG tablet; Take 1 tablet (150 mg total) by mouth 2 (two)  times daily.  Acute left ankle pain -     Uric Acid -     DG Ankle Complete Left; Future -     COMPLETE METABOLIC PANEL WITH GFR -     CBC -     ANA,IFA RA Diag Pnl w/rflx Tit/Patn -     Sedimentation Rate  Other orders -     Insulin Syringes, Disposable, U-100 1 ML MISC; 1 each by Does not apply route 6 (six) times daily.    Meds ordered this encounter  Medications  . insulin aspart (novoLOG) injection 10 Units  . albuterol (VENTOLIN HFA) 108 (90 Base) MCG/ACT inhaler    Sig: Inhale 2 puffs into the lungs every 4 (four) hours as needed for wheezing or shortness of breath.    Dispense:  18 g    Refill:  5  . pregabalin (LYRICA) 100 MG capsule    Sig: Take 1 capsule (100 mg total) by mouth 3 (three) times daily.    Dispense:  270 capsule    Refill:  3    Patient already on 50 mg lyrica through [facility]  . ranitidine (ZANTAC) 150 MG tablet    Sig: Take 1 tablet (150 mg total) by mouth 2 (two) times daily.    Dispense:  60 tablet    Refill:  3  . ketotifen (ZADITOR) 0.025 % ophthalmic solution    Sig: Place 1 drop into both eyes daily as needed (for dry eyes).    Dispense:  10 mL    Refill:  2  . hydrOXYzine (ATARAX/VISTARIL) 50 MG tablet    Sig: Take 1 tablet (50 mg total) by mouth 3 (three) times daily as needed.    Dispense:  90 tablet    Refill:  3  . acetaminophen-codeine (TYLENOL #3) 300-30 MG tablet    Sig: Take 1 tablet by mouth every 8 (eight) hours  as needed.    Dispense:  90 tablet    Refill:  2  . DISCONTD: Insulin Pen Needle (PX SHORTLENGTH PEN NEEDLES) 31G X 8 MM MISC    Sig: 1 each by Does not apply route 6 (six) times daily.    Dispense:  180 each    Refill:  11  . Insulin Pen Needle (PX SHORTLENGTH PEN NEEDLES) 31G X 8 MM MISC    Sig: 1 each by Does not apply route 6 (six) times daily.    Dispense:  180 each    Refill:  11  . Insulin Syringes, Disposable, U-100 1 ML MISC    Sig: 1 each by Does not apply route 6 (six) times daily.    Dispense:  180 each      Refill:  11    Follow-up: Return in about 4 weeks (around 08/24/2016) for diabetes .   Boykin Nearing MD

## 2016-07-27 NOTE — Progress Notes (Signed)
Pt needs refills on tylenol 3,Advair inhaler,Zaditor eye drops, lyrica,Zantac,and ventolin inhaler.

## 2016-07-27 NOTE — Patient Instructions (Addendum)
Roshawnda was seen today for foot pain and diabetes.  Diagnoses and all orders for this visit:  Uncontrolled type 2 diabetes mellitus with complication, unspecified long term insulin use status (HCC) -     POCT glucose (manual entry) -     insulin aspart (novoLOG) injection 10 Units; Inject 0.1 mLs (10 Units total) into the skin once. -     POCT glucose (manual entry)  Mild persistent asthma without complication -     albuterol (VENTOLIN HFA) 108 (90 Base) MCG/ACT inhaler; Inhale 2 puffs into the lungs every 4 (four) hours as needed for wheezing or shortness of breath.  Diabetic polyneuropathy associated with type 2 diabetes mellitus (HCC) -     pregabalin (LYRICA) 100 MG capsule; Take 1 capsule (100 mg total) by mouth 3 (three) times daily. -     acetaminophen-codeine (TYLENOL #3) 300-30 MG tablet; Take 1 tablet by mouth every 8 (eight) hours as needed.  Environmental allergies -     ketotifen (ZADITOR) 0.025 % ophthalmic solution; Place 1 drop into both eyes daily as needed (for dry eyes). -     hydrOXYzine (ATARAX/VISTARIL) 50 MG tablet; Take 1 tablet (50 mg total) by mouth 3 (three) times daily as needed.  Gastroesophageal reflux disease without esophagitis -     ranitidine (ZANTAC) 150 MG tablet; Take 1 tablet (150 mg total) by mouth 2 (two) times daily.  Acute left ankle pain -     Uric Acid -     DG Ankle Complete Left; Future -     COMPLETE METABOLIC PANEL WITH GFR -     CBC  elevate and compress ankle, ice it for 20 minutes 2 time per day, apply ice pack wrapped in thin cloth. We will wait on NSAID until I recheck your kidney function.   For next 28 days please make the following diet changes:  1. Unsweetened drinks only 2. No artificial sweeteners 3. No fried foods 4. No sweets 5. Increase fiber- oatmeal, bran 6. Increase healthy fats- nuts, avocado, olive oil  7. Increase vegetables that are high fiber (avoid corn, white potato)  8. Increase low sugar/ high  antioxidant  fruits- berries, avocado, s small apple granny smith,lemon and limes, kiwi   Limit rice and white starches to as little as possible   F/u in 4 weeks for left ankle pain and diabetes   Dr. Adrian Blackwater

## 2016-07-27 NOTE — Assessment & Plan Note (Addendum)
Acute pain without injury  No lesions  Suspect gout, she has CKD Plan: elevate and compress ankle, ice it for 20 minutes 2 time per day, apply ice pack wrapped in thin cloth. We will wait on NSAID until I recheck your kidney function.   Elevated uric acid CKD stage 3 Colchicine 0.3 mg daily until pain resolves

## 2016-07-27 NOTE — Assessment & Plan Note (Addendum)
Uncontrolled diabetes with poor diet Evidence of chronic inflammation  Plan: For next 28 days please make the following diet changes:  1. Unsweetened drinks only 2. No artificial sweeteners 3. No fried foods 4. No sweets 5. Increase fiber- oatmeal, bran 6. Increase healthy fats- nuts, avocado, olive oil  7. Increase vegetables that are high fiber (avoid corn, white potato)  8. Increase low sugar/ high antioxidant  fruits- berries, avocado, s small apple granny smith,lemon and limes, kiwi   Limit rice and white starches to as little as possible

## 2016-07-28 LAB — SEDIMENTATION RATE: Sed Rate: 21 mm/hr (ref 0–30)

## 2016-07-28 LAB — URIC ACID: Uric Acid, Serum: 10.4 mg/dL — ABNORMAL HIGH (ref 2.5–7.0)

## 2016-07-28 MED ORDER — FERROUS SULFATE 325 (65 FE) MG PO TABS: 325.0000 mg | ORAL_TABLET | Freq: Two times a day (BID) | ORAL | 3 refills | Status: DC

## 2016-07-28 MED ORDER — COLCHICINE 0.6 MG PO TABS: 0.3000 mg | ORAL_TABLET | Freq: Every day | ORAL | 2 refills | Status: DC

## 2016-07-28 MED FILL — COLCHICINE 0.6 MG CAPSULE: 0.6 | 30 days supply | Qty: 15 | Fill #0

## 2016-07-28 MED FILL — FERROUS SULFATE 325 MG TAB: 325 (65 FE) | 30 days supply | Qty: 60 | Fill #0

## 2016-07-28 MED FILL — TRUEPLUS SYR 0.3ML 31GX5/16: 31G X 5/16" | 30 days supply | Qty: 100 | Fill #0

## 2016-07-28 NOTE — Addendum Note (Signed)
Addended by: Boykin Nearing on: 07/28/2016 08:23 AM   Modules accepted: Orders

## 2016-07-28 NOTE — Assessment & Plan Note (Signed)
Anemia in setting of chronic kidney disease due to HTN and diabetes Plan: Iron

## 2016-07-29 LAB — ANA,IFA RA DIAG PNL W/RFLX TIT/PATN
Anti Nuclear Antibody(ANA): NEGATIVE
Cyclic Citrullin Peptide Ab: 20 Units — ABNORMAL HIGH
Rhuematoid fact SerPl-aCnc: <14 IU/mL (ref <14)

## 2016-08-03 ENCOUNTER — Other Ambulatory Visit: Payer: Self-pay

## 2016-08-03 ENCOUNTER — Telehealth: Payer: Self-pay

## 2016-08-03 DIAGNOSIS — J4530 Mild persistent asthma, uncomplicated: Secondary | ICD-10-CM

## 2016-08-03 DIAGNOSIS — E118 Type 2 diabetes mellitus with unspecified complications: Principal | ICD-10-CM

## 2016-08-03 DIAGNOSIS — E1165 Type 2 diabetes mellitus with hyperglycemia: Secondary | ICD-10-CM

## 2016-08-03 MED ORDER — INSULIN ASPART 100 UNIT/ML FLEXPEN: 10.0000 [IU] | PEN_INJECTOR | Freq: Three times a day (TID) | SUBCUTANEOUS | 3 refills | Status: DC

## 2016-08-03 MED ORDER — EXENATIDE 5 MCG/0.02ML ~~LOC~~ SOPN: PEN_INJECTOR | SUBCUTANEOUS | 3 refills | Status: DC

## 2016-08-03 MED ORDER — FLUTICASONE-SALMETEROL 250-50 MCG/DOSE IN AEPB
1.0000 | INHALATION_SPRAY | Freq: Two times a day (BID) | RESPIRATORY_TRACT | 3 refills | Status: DC
Start: 2016-08-03 — End: 2016-09-01

## 2016-08-03 MED ORDER — INSULIN GLARGINE 100 UNIT/ML SOLOSTAR PEN: 50.0000 [IU] | PEN_INJECTOR | Freq: Every day | SUBCUTANEOUS | 3 refills | Status: DC

## 2016-08-03 MED ORDER — SITAGLIPTIN PHOSPHATE 50 MG PO TABS: 50.0000 mg | ORAL_TABLET | Freq: Every day | ORAL | 3 refills | Status: DC

## 2016-08-03 MED ORDER — ALBUTEROL SULFATE HFA 108 (90 BASE) MCG/ACT IN AERS: 2.0000 | INHALATION_SPRAY | RESPIRATORY_TRACT | 3 refills | Status: DC | PRN

## 2016-08-03 NOTE — Telephone Encounter (Signed)
Pt was called and informed of lab results and medications being sent to her pharmacy. Pt is also aware of referral being placed for GI.

## 2016-08-17 ENCOUNTER — Other Ambulatory Visit: Payer: Self-pay | Admitting: Family Medicine

## 2016-08-17 DIAGNOSIS — A6000 Herpesviral infection of urogenital system, unspecified: Secondary | ICD-10-CM

## 2016-08-17 MED FILL — LISINOPRIL 40 MG TABLET: 40 | 30 days supply | Qty: 30 | Fill #3

## 2016-08-17 MED FILL — hydrALAZINE HCL 25 MG TABS: 25 | 30 days supply | Qty: 90 | Fill #2

## 2016-08-17 MED FILL — ?VALACYCLOVIR HCL 500MG TAB: 500 | 30 days supply | Qty: 30 | Fill #0

## 2016-08-17 MED FILL — JANUVIA 50 MG TABLET: 50 | 30 days supply | Qty: 30 | Fill #7

## 2016-08-17 MED FILL — ATORVASTATIN 20 MG TABLET: 20 | 30 days supply | Qty: 30 | Fill #2

## 2016-08-21 ENCOUNTER — Observation Stay (HOSPITAL_COMMUNITY)
Admission: EM | Admit: 2016-08-21 | Discharge: 2016-08-22 | Disposition: A | Discharge status: Home / Self Care | Payer: Self-pay | Attending: Internal Medicine | Admitting: Internal Medicine

## 2016-08-21 ENCOUNTER — Encounter (HOSPITAL_COMMUNITY): Payer: Self-pay | Admitting: Emergency Medicine

## 2016-08-21 DIAGNOSIS — E785 Hyperlipidemia, unspecified: Secondary | ICD-10-CM | POA: Insufficient documentation

## 2016-08-21 DIAGNOSIS — T464X5A Adverse effect of angiotensin-converting-enzyme inhibitors, initial encounter: Secondary | ICD-10-CM | POA: Insufficient documentation

## 2016-08-21 DIAGNOSIS — R197 Diarrhea, unspecified: Secondary | ICD-10-CM | POA: Insufficient documentation

## 2016-08-21 DIAGNOSIS — N183 Chronic kidney disease, stage 3 unspecified: Secondary | ICD-10-CM | POA: Diagnosis present

## 2016-08-21 DIAGNOSIS — E1122 Type 2 diabetes mellitus with diabetic chronic kidney disease: Secondary | ICD-10-CM | POA: Insufficient documentation

## 2016-08-21 DIAGNOSIS — Z87891 Personal history of nicotine dependence: Secondary | ICD-10-CM | POA: Insufficient documentation

## 2016-08-21 DIAGNOSIS — Z6841 Body Mass Index (BMI) 40.0 and over, adult: Secondary | ICD-10-CM | POA: Insufficient documentation

## 2016-08-21 DIAGNOSIS — D638 Anemia in other chronic diseases classified elsewhere: Secondary | ICD-10-CM | POA: Insufficient documentation

## 2016-08-21 DIAGNOSIS — E1165 Type 2 diabetes mellitus with hyperglycemia: Secondary | ICD-10-CM | POA: Diagnosis present

## 2016-08-21 DIAGNOSIS — D509 Iron deficiency anemia, unspecified: Secondary | ICD-10-CM | POA: Insufficient documentation

## 2016-08-21 DIAGNOSIS — E875 Hyperkalemia: Secondary | ICD-10-CM | POA: Diagnosis present

## 2016-08-21 DIAGNOSIS — N184 Chronic kidney disease, stage 4 (severe): Secondary | ICD-10-CM | POA: Diagnosis present

## 2016-08-21 DIAGNOSIS — M109 Gout, unspecified: Secondary | ICD-10-CM | POA: Insufficient documentation

## 2016-08-21 DIAGNOSIS — E1140 Type 2 diabetes mellitus with diabetic neuropathy, unspecified: Secondary | ICD-10-CM | POA: Insufficient documentation

## 2016-08-21 DIAGNOSIS — Z794 Long term (current) use of insulin: Secondary | ICD-10-CM | POA: Insufficient documentation

## 2016-08-21 DIAGNOSIS — K219 Gastro-esophageal reflux disease without esophagitis: Secondary | ICD-10-CM | POA: Diagnosis present

## 2016-08-21 DIAGNOSIS — D631 Anemia in chronic kidney disease: Secondary | ICD-10-CM | POA: Diagnosis present

## 2016-08-21 DIAGNOSIS — I130 Hypertensive heart and chronic kidney disease with heart failure and stage 1 through stage 4 chronic kidney disease, or unspecified chronic kidney disease: Principal | ICD-10-CM | POA: Insufficient documentation

## 2016-08-21 DIAGNOSIS — I5032 Chronic diastolic (congestive) heart failure: Secondary | ICD-10-CM | POA: Insufficient documentation

## 2016-08-21 DIAGNOSIS — E118 Type 2 diabetes mellitus with unspecified complications: Secondary | ICD-10-CM

## 2016-08-21 DIAGNOSIS — T504X5A Adverse effect of drugs affecting uric acid metabolism, initial encounter: Secondary | ICD-10-CM | POA: Insufficient documentation

## 2016-08-21 DIAGNOSIS — E860 Dehydration: Secondary | ICD-10-CM | POA: Insufficient documentation

## 2016-08-21 DIAGNOSIS — Z79899 Other long term (current) drug therapy: Secondary | ICD-10-CM | POA: Insufficient documentation

## 2016-08-21 DIAGNOSIS — T39395A Adverse effect of other nonsteroidal anti-inflammatory drugs [NSAID], initial encounter: Secondary | ICD-10-CM | POA: Insufficient documentation

## 2016-08-21 DIAGNOSIS — Z7982 Long term (current) use of aspirin: Secondary | ICD-10-CM | POA: Insufficient documentation

## 2016-08-21 DIAGNOSIS — E1100 Type 2 diabetes mellitus with hyperosmolarity without nonketotic hyperglycemic-hyperosmolar coma (NKHHC): Secondary | ICD-10-CM | POA: Diagnosis present

## 2016-08-21 LAB — COMPREHENSIVE METABOLIC PANEL
ALT: 21 U/L (ref 14–54)
AST: 19 U/L (ref 15–41)
Albumin: 3.9 g/dL (ref 3.5–5.0)
Alkaline Phosphatase: 86 U/L (ref 38–126)
Anion gap: 9 (ref 5–15)
BUN: 90 mg/dL — ABNORMAL HIGH (ref 6–20)
CO2: 18 mmol/L — ABNORMAL LOW (ref 22–32)
Calcium: 8.9 mg/dL (ref 8.9–10.3)
Chloride: 107 mmol/L (ref 101–111)
Creatinine, Ser: 2.61 mg/dL — ABNORMAL HIGH (ref 0.44–1.00)
GFR calc Af Amer: 22 mL/min — ABNORMAL LOW (ref >60)
GFR calc non Af Amer: 19 mL/min — ABNORMAL LOW (ref >60)
Glucose, Bld: 245 mg/dL — ABNORMAL HIGH (ref 65–99)
Potassium: 6.4 mmol/L (ref 3.5–5.1)
Sodium: 134 mmol/L — ABNORMAL LOW (ref 135–145)
Total Bilirubin: 0.7 mg/dL (ref 0.3–1.2)
Total Protein: 7.6 g/dL (ref 6.5–8.1)

## 2016-08-21 LAB — BASIC METABOLIC PANEL
Anion gap: 10 (ref 5–15)
BUN: 91 mg/dL — ABNORMAL HIGH (ref 6–20)
CO2: 18 mmol/L — ABNORMAL LOW (ref 22–32)
Calcium: 8.9 mg/dL (ref 8.9–10.3)
Chloride: 107 mmol/L (ref 101–111)
Creatinine, Ser: 2.69 mg/dL — ABNORMAL HIGH (ref 0.44–1.00)
GFR calc Af Amer: 21 mL/min — ABNORMAL LOW (ref >60)
GFR calc non Af Amer: 18 mL/min — ABNORMAL LOW (ref >60)
Glucose, Bld: 228 mg/dL — ABNORMAL HIGH (ref 65–99)
Potassium: 6.4 mmol/L (ref 3.5–5.1)
Sodium: 135 mmol/L (ref 135–145)

## 2016-08-21 LAB — URIC ACID: Uric Acid, Serum: 10.9 mg/dL — ABNORMAL HIGH (ref 2.3–6.6)

## 2016-08-21 LAB — CBC
HCT: 35.2 % — ABNORMAL LOW (ref 36.0–46.0)
HCT: 37 % (ref 36.0–46.0)
Hemoglobin: 11.3 g/dL — ABNORMAL LOW (ref 12.0–15.0)
Hemoglobin: 11.8 g/dL — ABNORMAL LOW (ref 12.0–15.0)
MCH: 27.9 pg (ref 26.0–34.0)
MCH: 27.8 pg (ref 26.0–34.0)
MCHC: 32.1 g/dL (ref 30.0–36.0)
MCHC: 31.9 g/dL (ref 30.0–36.0)
MCV: 86.9 fL (ref 78.0–100.0)
MCV: 87.3 fL (ref 78.0–100.0)
Platelets: 226 10*3/uL (ref 150–400)
Platelets: 246 10*3/uL (ref 150–400)
RBC: 4.05 MIL/uL (ref 3.87–5.11)
RBC: 4.24 MIL/uL (ref 3.87–5.11)
RDW: 14.2 % (ref 11.5–15.5)
RDW: 14.3 % (ref 11.5–15.5)
WBC: 6.3 10*3/uL (ref 4.0–10.5)
WBC: 6.8 10*3/uL (ref 4.0–10.5)

## 2016-08-21 LAB — GLUCOSE, CAPILLARY: Glucose-Capillary: 267 mg/dL — ABNORMAL HIGH (ref 65–99)

## 2016-08-21 LAB — LIPASE, BLOOD: Lipase: 41 U/L (ref 11–51)

## 2016-08-21 LAB — POTASSIUM: Potassium: 4.8 mmol/L (ref 3.5–5.1)

## 2016-08-21 MED ORDER — SODIUM CHLORIDE 0.9 % IV BOLUS (SEPSIS)
1000.0000 mL | Freq: Once | INTRAVENOUS | Status: AC
  Administered 2016-08-21: 1000 mL via INTRAVENOUS

## 2016-08-21 MED ORDER — CARVEDILOL 6.25 MG PO TABS
6.2500 mg | ORAL_TABLET | Freq: Two times a day (BID) | ORAL | Status: DC
  Administered 2016-08-22: 6.25 mg via ORAL
  Filled 2016-08-21: qty 1

## 2016-08-21 MED ORDER — SODIUM CHLORIDE 0.9 % IV BOLUS (SEPSIS): 1000.0000 mL | Freq: Once | INTRAVENOUS | Status: DC

## 2016-08-21 MED ORDER — GABAPENTIN 100 MG PO CAPS
100.0000 mg | ORAL_CAPSULE | Freq: Three times a day (TID) | ORAL | Status: DC
  Administered 2016-08-21 – 2016-08-22 (×2): 100 mg via ORAL
  Filled 2016-08-21 (×2): qty 1

## 2016-08-21 MED ORDER — FAMOTIDINE 20 MG PO TABS
20.0000 mg | ORAL_TABLET | Freq: Every day | ORAL | Status: DC
  Administered 2016-08-21: 20 mg via ORAL

## 2016-08-21 MED ORDER — INSULIN GLARGINE 100 UNIT/ML ~~LOC~~ SOLN: 35.0000 [IU] | Freq: Every day | SUBCUTANEOUS | Status: DC

## 2016-08-21 MED ORDER — VALACYCLOVIR HCL 500 MG PO TABS
500.0000 mg | ORAL_TABLET | Freq: Every day | ORAL | Status: DC
  Administered 2016-08-22: 500 mg via ORAL
  Filled 2016-08-21: qty 1

## 2016-08-21 MED ORDER — HYDRALAZINE HCL 25 MG PO TABS
25.0000 mg | ORAL_TABLET | Freq: Three times a day (TID) | ORAL | Status: DC
  Administered 2016-08-21: 25 mg via ORAL
  Filled 2016-08-21: qty 1

## 2016-08-21 MED ORDER — SODIUM CHLORIDE 0.9 % IV SOLN
1.0000 g | Freq: Once | INTRAVENOUS | Status: AC
  Administered 2016-08-21: 1 g via INTRAVENOUS
  Filled 2016-08-21: qty 10

## 2016-08-21 MED ORDER — ASPIRIN 325 MG PO TABS
325.0000 mg | ORAL_TABLET | Freq: Every day | ORAL | Status: DC
  Administered 2016-08-22: 325 mg via ORAL
  Filled 2016-08-21: qty 1

## 2016-08-21 MED ORDER — DEXTROSE 10 % IV SOLN: Freq: Once | INTRAVENOUS | Status: DC

## 2016-08-21 MED ORDER — ALBUTEROL SULFATE (2.5 MG/3ML) 0.083% IN NEBU: 3.0000 mL | INHALATION_SOLUTION | RESPIRATORY_TRACT | Status: DC | PRN

## 2016-08-21 MED ORDER — KETOTIFEN FUMARATE 0.025 % OP SOLN: 1.0000 [drp] | Freq: Every day | OPHTHALMIC | Status: DC | PRN

## 2016-08-21 MED ORDER — LINAGLIPTIN 5 MG PO TABS
5.0000 mg | ORAL_TABLET | Freq: Every day | ORAL | Status: DC
  Administered 2016-08-22: 5 mg via ORAL
  Filled 2016-08-21 (×2): qty 1

## 2016-08-21 MED ORDER — FAMOTIDINE 20 MG PO TABS
20.0000 mg | ORAL_TABLET | Freq: Two times a day (BID) | ORAL | Status: DC
  Filled 2016-08-21: qty 1

## 2016-08-21 MED ORDER — SODIUM POLYSTYRENE SULFONATE 15 GM/60ML PO SUSP
30.0000 g | Freq: Once | ORAL | Status: AC
  Administered 2016-08-21: 30 g via ORAL
  Filled 2016-08-21: qty 120

## 2016-08-21 MED ORDER — ONDANSETRON HCL 4 MG PO TABS: 4.0000 mg | ORAL_TABLET | Freq: Four times a day (QID) | ORAL | Status: DC | PRN

## 2016-08-21 MED ORDER — ATORVASTATIN CALCIUM 20 MG PO TABS
20.0000 mg | ORAL_TABLET | Freq: Every day | ORAL | Status: DC
  Administered 2016-08-22: 20 mg via ORAL
  Filled 2016-08-21: qty 1

## 2016-08-21 MED ORDER — INSULIN ASPART 100 UNIT/ML ~~LOC~~ SOLN
0.0000 [IU] | Freq: Three times a day (TID) | SUBCUTANEOUS | Status: DC
  Administered 2016-08-22: 2 [IU] via SUBCUTANEOUS
  Administered 2016-08-22: 3 [IU] via SUBCUTANEOUS

## 2016-08-21 MED ORDER — SODIUM BICARBONATE 8.4 % IV SOLN
50.0000 meq | Freq: Once | INTRAVENOUS | Status: AC
  Administered 2016-08-21: 50 meq via INTRAVENOUS
  Filled 2016-08-21: qty 50

## 2016-08-21 MED ORDER — SODIUM CHLORIDE 0.9% FLUSH
3.0000 mL | Freq: Two times a day (BID) | INTRAVENOUS | Status: DC
  Administered 2016-08-21: 3 mL via INTRAVENOUS

## 2016-08-21 MED ORDER — SODIUM CHLORIDE 0.9 % IV SOLN
INTRAVENOUS | Status: DC
  Administered 2016-08-21: 18:00:00 via INTRAVENOUS

## 2016-08-21 MED ORDER — MOMETASONE FURO-FORMOTEROL FUM 200-5 MCG/ACT IN AERO
2.0000 | INHALATION_SPRAY | Freq: Two times a day (BID) | RESPIRATORY_TRACT | Status: DC
  Filled 2016-08-21: qty 8.8

## 2016-08-21 MED ORDER — DEXTROSE 50 % IV SOLN
25.0000 mL | Freq: Once | INTRAVENOUS | Status: AC
  Administered 2016-08-21: 25 mL via INTRAVENOUS
  Filled 2016-08-21: qty 50

## 2016-08-21 MED ORDER — INSULIN ASPART 100 UNIT/ML IV SOLN
10.0000 [IU] | Freq: Once | INTRAVENOUS | Status: AC
  Administered 2016-08-21: 10 [IU] via INTRAVENOUS
  Filled 2016-08-21: qty 0.1

## 2016-08-21 MED ORDER — ONDANSETRON HCL 4 MG/2ML IJ SOLN: 4.0000 mg | Freq: Four times a day (QID) | INTRAMUSCULAR | Status: DC | PRN

## 2016-08-21 MED ORDER — HEPARIN SODIUM (PORCINE) 5000 UNIT/ML IJ SOLN
5000.0000 [IU] | Freq: Three times a day (TID) | INTRAMUSCULAR | Status: DC
  Administered 2016-08-21 – 2016-08-22 (×2): 5000 [IU] via SUBCUTANEOUS
  Filled 2016-08-21 (×2): qty 1

## 2016-08-21 MED ORDER — HYDROCODONE-ACETAMINOPHEN 5-325 MG PO TABS
1.0000 | ORAL_TABLET | Freq: Four times a day (QID) | ORAL | Status: DC | PRN
  Administered 2016-08-21: 1 via ORAL
  Filled 2016-08-21: qty 1

## 2016-08-21 MED ORDER — INSULIN ASPART 100 UNIT/ML ~~LOC~~ SOLN
0.0000 [IU] | Freq: Every day | SUBCUTANEOUS | Status: DC
  Administered 2016-08-21: 3 [IU] via SUBCUTANEOUS

## 2016-08-21 MED ORDER — SODIUM CHLORIDE 0.9 % IV BOLUS (SEPSIS): 500.0000 mL | Freq: Once | INTRAVENOUS | Status: DC

## 2016-08-21 NOTE — ED Provider Notes (Signed)
[facility] DEPT Provider Note   CSN: [id number] Arrival date & time: 08/21/16  1324  By signing my name below, I, Reola Mosher, attest that this documentation has been prepared under the direction and in the presence of Virgel Manifold, MD. Electronically Signed: Reola Mosher, ED Scribe. 08/21/16. 3:47 PM.  History   Chief Complaint Chief Complaint  Patient presents with  . Emesis  . Diarrhea   The history is provided by the patient. No language interpreter was used.    HPI Comments: [name] is a 61 y.o. female with a h/o CHF on Lasix, DM, GERD, and CKD stage III, who presents to the Emergency Department complaining of intermittent episodes of diarrhea beginning two days ago. Pt notes that she has had nine episodes of this since last night. She reports associated headache, nausea, subjective fever, fatigue, and burning upper abdominal secondary to her diarrhea. She also notes that her right foot has been in pain since the onset of her other symtpoms; however, she states that this feels similar to her typical Gout flare-ups. No noted treatments for her symptoms were tried prior to coming into the ED. No sick contact with similar symptoms. She denies hematochezia, urgency, frequency, hematuria, dysuria, difficulty urinating, or any other associated symptoms.   PCP: Minerva Ends, MD  Past Medical History:  Diagnosis Date  . Asthma    As a child   . Bronchitis   . CHF (congestive heart failure) ([place]) 2011  . Complication of anesthesia   . Diabetes mellitus Dx 1989  . Hepatitis C Dx 2013  . Hypertension Dx 1989  . Obesity   . Pancreatitis 2013  . Refusal of blood transfusions as patient is Jehovah's Witness   . Ulcer ([place]) 2010   Patient Active Problem List   Diagnosis Date Noted  . Acute left ankle pain 07/27/2016  . Injury of left hand 02/24/2016  . Asthma 11/29/2015  . Trigger middle finger 11/25/2015  . Depression 09/05/2015  . GERD  (gastroesophageal reflux disease) 03/25/2015  . Environmental allergies 03/14/2015  . Hep C w/o coma, chronic ([place]) 01/31/2015  . Diabetic neuropathy ([place]) 01/31/2015  . OSA (obstructive sleep apnea) 01/01/2015  . Poor dentition 11/13/2014  . Essential hypertension 10/08/2014  . Obesity 10/08/2014  . CKD (chronic kidney disease) stage 3, GFR 30-59 ml/min 10/08/2014  . Diabetes mellitus type 2, uncontrolled, with complications ([place]) 99/24/2683    Class: Chronic  . Chronic diastolic CHF (congestive heart failure), NYHA class 2 ([place]) 03/25/2012  . Anemia in stage 3 chronic kidney disease 03/25/2012   Past Surgical History:  Procedure Laterality Date  . CHOLECYSTECTOMY    . EYE SURGERY    . KNEE ARTHROSCOPY    . TUBAL LIGATION    . TUBAL LIGATION     OB History    No data available     Home Medications    Prior to Admission medications   Medication Sig Start Date End Date Taking? Authorizing Provider  acetaminophen-codeine (TYLENOL #3) 300-30 MG tablet Take 1 tablet by mouth every 8 (eight) hours as needed. 07/27/16   Josalyn Funches, MD  albuterol (PROVENTIL) (2.5 MG/3ML) 0.083% nebulizer solution Take 3 mLs (2.5 mg total) by nebulization every 6 (six) hours as needed for wheezing or shortness of breath. 03/25/15   Arnoldo Morale, MD  albuterol (VENTOLIN HFA) 108 (90 Base) MCG/ACT inhaler Inhale 2 puffs into the lungs every 4 (four) hours as needed for wheezing or shortness of breath. 08/03/16  Boykin Nearing, MD  aspirin 325 MG tablet Take 1 tablet (325 mg total) by mouth daily. 06/27/15   Josalyn Funches, MD  atorvastatin (LIPITOR) 20 MG tablet TAKE 1 TABLET BY MOUTH DAILY 06/12/16   Boykin Nearing, MD  Blood Glucose Monitoring Suppl (TRUE METRIX METER) w/Device KIT 1 each by Does not apply route as needed. 11/25/15   Josalyn Funches, MD  carvedilol (COREG) 25 MG tablet TAKE 1 TABLET BY MOUTH 2 TIMES DAILY WITH A MEAL. 06/12/16   Josalyn Funches, MD  colchicine 0.6 MG tablet Take 0.5  tablets (0.3 mg total) by mouth daily. 07/28/16   Josalyn Funches, MD  diclofenac sodium (VOLTAREN) 1 % GEL Apply 4 g topically daily as needed (for pain). 11/25/15   Josalyn Funches, MD  exenatide (BYETTA 5 MCG PEN) 5 MCG/0.02ML SOPN injection INJECT 5 MCG INTO THE SKIN 2 TIMES DAILY WITH A MEAL. 08/03/16   Boykin Nearing, MD  ferrous sulfate 325 (65 FE) MG tablet Take 1 tablet (325 mg total) by mouth 2 (two) times daily with a meal. 07/28/16   Josalyn Funches, MD  fluticasone (FLONASE) 50 MCG/ACT nasal spray Place 2 sprays into both nostrils daily. Patient taking differently: Place 2 sprays into both nostrils daily as needed for allergies.  10/01/15   Josalyn Funches, MD  Fluticasone-Salmeterol (ADVAIR DISKUS) 250-50 MCG/DOSE AEPB Inhale 1 puff into the lungs 2 (two) times daily. 08/03/16   Josalyn Funches, MD  furosemide (LASIX) 40 MG tablet TAKE 2 TABLETS AT 8 AM AND 1 TABLET AT 2 PM 05/11/16   Josalyn Funches, MD  glucose blood (TRUE METRIX BLOOD GLUCOSE TEST) test strip 1 each by Other route 3 (three) times daily. 11/25/15   Josalyn Funches, MD  hydrALAZINE (APRESOLINE) 25 MG tablet TAKE 1 TABLET BY MOUTH 3 TIMES DAILY. 06/12/16   Josalyn Funches, MD  hydrOXYzine (ATARAX/VISTARIL) 50 MG tablet Take 1 tablet (50 mg total) by mouth 3 (three) times daily as needed. 07/27/16   Josalyn Funches, MD  ibuprofen (ADVIL,MOTRIN) 800 MG tablet Take 1 tablet (800 mg total) by mouth 3 (three) times daily. 02/10/16   Delsa Grana, PA-C  insulin aspart (NOVOLOG) 100 UNIT/ML FlexPen Inject 10 Units into the skin 3 (three) times daily with meals. 08/03/16   Josalyn Funches, MD  Insulin Glargine (LANTUS SOLOSTAR) 100 UNIT/ML Solostar Pen Inject 50 Units into the skin daily at 10 pm. 08/03/16   Boykin Nearing, MD  Insulin Pen Needle ([place] SHORTLENGTH PEN NEEDLES) 31G X 8 MM MISC 1 each by Does not apply route 6 (six) times daily. 07/27/16   Josalyn Funches, MD  Insulin Syringes, Disposable, U-100 1 ML MISC 1 each by Does not  apply route 6 (six) times daily. 07/27/16   Josalyn Funches, MD  ketotifen (ZADITOR) 0.025 % ophthalmic solution Place 1 drop into both eyes daily as needed (for dry eyes). 07/27/16   Josalyn Funches, MD  lisinopril (PRINIVIL,ZESTRIL) 40 MG tablet Take 1 tablet (40 mg total) by mouth daily. 11/25/15   Josalyn Funches, MD  ondansetron (ZOFRAN) 8 MG tablet Take 1 tablet (8 mg total) by mouth every 8 (eight) hours as needed for nausea or vomiting. 09/17/15   Boykin Nearing, MD  Potassium Chloride ER 20 MEQ TBCR Take 1 tablet by mouth twice daily with Lasix. 06/12/16   Josalyn Funches, MD  pregabalin (LYRICA) 100 MG capsule Take 1 capsule (100 mg total) by mouth 3 (three) times daily. 07/27/16   Josalyn Funches, MD  ranitidine (ZANTAC) 150 MG tablet  Take 1 tablet (150 mg total) by mouth 2 (two) times daily. 07/27/16   Josalyn Funches, MD  sitaGLIPtin (JANUVIA) 50 MG tablet Take 1 tablet (50 mg total) by mouth daily. 08/03/16   Josalyn Funches, MD  valACYclovir (VALTREX) 500 MG tablet TAKE 1 TABLET BY MOUTH DAILY. 08/17/16   Boykin Nearing, MD   Family History Family History  Problem Relation Age of Onset  . Colon cancer Mother   . Heart attack Other   . Heart attack Maternal Grandmother   . Hypertension Sister   . Hypertension Brother   . Diabetes Paternal Grandmother    Social History Social History  Substance Use Topics  . Smoking status: Former Smoker    Quit date: 10/25/1980  . Smokeless tobacco: Never Used     Comment: quit smoking in 1982  . Alcohol use Yes     Comment: 3 times in last year   Allergies   Shellfish allergy; Diazepam; and Morphine and related  Review of Systems Review of Systems  Constitutional: Positive for fatigue and fever (subjective).  Gastrointestinal: Positive for abdominal pain, diarrhea and nausea. Negative for blood in stool and vomiting.  Genitourinary: Negative for difficulty urinating, dysuria, frequency, hematuria and urgency.  Neurological: Positive for  headaches.  All other systems reviewed and are negative.  Physical Exam Updated Vital Signs BP 116/60 (BP Location: Left Arm)   Pulse 65   Temp 98.6 F (37 C) (Oral)   Resp 16   SpO2 96%   Physical Exam  Constitutional: She appears well-developed and well-nourished.  Appears tired.   HENT:  Head: Normocephalic.  Right Ear: External ear normal.  Left Ear: External ear normal.  Nose: Nose normal.  Mouth/Throat: Oropharynx is clear and moist.  Eyes: Conjunctivae are normal. Right eye exhibits no discharge. Left eye exhibits no discharge.  Neck: Normal range of motion.  Cardiovascular: Normal rate, regular rhythm and normal heart sounds.   No murmur heard. Pulmonary/Chest: Effort normal and breath sounds normal. No respiratory distress. She has no wheezes. She has no rales.  Abdominal: Soft. She exhibits no distension. There is tenderness. There is no rebound and no guarding.  Mild upper abdominal tenderness.   Musculoskeletal: Normal range of motion. She exhibits no edema or tenderness.  Right foot with fungal changes to skin and nails. No obvious swelling or erythema to toes, foot, or ankle.   Neurological: She is alert. No cranial nerve deficit. Coordination normal.  Skin: Skin is warm and dry. No rash noted. No erythema. No pallor.  Psychiatric: She has a normal mood and affect. Her behavior is normal.  Nursing note and vitals reviewed.  ED Treatments / Results  DIAGNOSTIC STUDIES: Oxygen Saturation is 96% on RA, normal by my interpretation.   COORDINATION OF CARE: 3:42 PM-Discussed next steps with pt. Pt verbalized understanding and is agreeable with the plan.   Labs (all labs ordered are listed, but only abnormal results are displayed) Labs Reviewed  COMPREHENSIVE METABOLIC PANEL - Abnormal; Notable for the following:       Result Value   Sodium 134 (*)    Potassium 6.4 (*)    CO2 18 (*)    Glucose, Bld 245 (*)    BUN 90 (*)    Creatinine, Ser 2.61 (*)    GFR  calc non Af Amer 19 (*)    GFR calc Af Amer 22 (*)    All other components within normal limits  CBC - Abnormal; Notable for the following:  Hemoglobin 11.3 (*)    HCT 35.2 (*)    All other components within normal limits  URINALYSIS, ROUTINE W REFLEX MICROSCOPIC - Abnormal; Notable for the following:    Protein, ur 30 (*)    Squamous Epithelial / LPF 0-5 (*)    All other components within normal limits  BASIC METABOLIC PANEL - Abnormal; Notable for the following:    Potassium 6.4 (*)    CO2 18 (*)    Glucose, Bld 228 (*)    BUN 91 (*)    Creatinine, Ser 2.69 (*)    GFR calc non Af Amer 18 (*)    GFR calc Af Amer 21 (*)    All other components within normal limits  CBC - Abnormal; Notable for the following:    Hemoglobin 11.8 (*)    All other components within normal limits  URIC ACID - Abnormal; Notable for the following:    Uric Acid, Serum 10.9 (*)    All other components within normal limits  BASIC METABOLIC PANEL - Abnormal; Notable for the following:    CO2 19 (*)    Glucose, Bld 168 (*)    BUN 84 (*)    Creatinine, Ser 2.43 (*)    Calcium 8.3 (*)    GFR calc non Af Amer 21 (*)    GFR calc Af Amer 24 (*)    All other components within normal limits  CBC - Abnormal; Notable for the following:    RBC 3.70 (*)    Hemoglobin 10.1 (*)    HCT 32.3 (*)    All other components within normal limits  HEMOGLOBIN A1C - Abnormal; Notable for the following:    Hgb A1c MFr Bld 9.2 (*)    All other components within normal limits  GLUCOSE, CAPILLARY - Abnormal; Notable for the following:    Glucose-Capillary 267 (*)    All other components within normal limits  GLUCOSE, CAPILLARY - Abnormal; Notable for the following:    Glucose-Capillary 186 (*)    All other components within normal limits  GLUCOSE, CAPILLARY - Abnormal; Notable for the following:    Glucose-Capillary 206 (*)    All other components within normal limits  LIPASE, BLOOD  SODIUM, URINE, RANDOM  POTASSIUM    POTASSIUM  MAGNESIUM  POTASSIUM   EKG  EKG Interpretation  Date/Time:  Friday August 21 2016 16:22:05 EST Ventricular Rate:  59 PR Interval:    QRS Duration: 92 QT Interval:  469 QTC Calculation: 465 R Axis:   64 Text Interpretation:  Sinus rhythm Non-specific ST-t changes Confirmed by Wilson Singer  MD, Slayter Moorhouse ([id number]) on 08/21/2016 4:34:02 PM      Radiology No results found.  Procedures Procedures   Medications Ordered in ED Medications  insulin aspart (novoLOG) injection 10 Units (10 Units Intravenous Given 08/21/16 1555)  sodium polystyrene (KAYEXALATE) 15 GM/60ML suspension 30 g (30 g Oral Given 08/21/16 1555)  sodium chloride 0.9 % bolus 1,000 mL (0 mLs Intravenous Stopped 08/21/16 1720)  calcium gluconate 1 g in sodium chloride 0.9 % 100 mL IVPB (0 g Intravenous Stopped 08/21/16 1633)  dextrose 50 % solution 25 mL (25 mLs Intravenous Given 08/21/16 1555)  sodium bicarbonate injection 50 mEq (50 mEq Intravenous Given 08/21/16 1555)  sodium chloride 0.9 % bolus 500 mL (500 mLs Intravenous Given 08/22/16 0842)   Initial Impression / Assessment and Plan / ED Course  I have reviewed the triage vital signs and the nursing notes.  Pertinent labs & imaging  results that were available during my care of the patient were reviewed by me and considered in my medical decision making (see chart for details).     Final Clinical Impressions(s) / ED Diagnoses   Final diagnoses:  Dehydration  Hyperkalemia   New Prescriptions New Prescriptions   No medications on file   I personally preformed the services scribed in my presence. The recorded information has been reviewed is accurate. Virgel Manifold, MD.     Virgel Manifold, MD 09/02/16 [phone]

## 2016-08-21 NOTE — Progress Notes (Signed)
Patient arrived on the floor at [facility]. Assessment completed, Careplan added to the chart; orders released and pt advised to order dinner.

## 2016-08-21 NOTE — ED Notes (Signed)
Report given to floor

## 2016-08-21 NOTE — H&P (Signed)
TRH H&P   Patient Demographics:    [name], is a 61 y.o. female  MRN: [id number]   DOB - [birth year]  Admit Date - 08/21/2016  Outpatient Primary MD for the patient is Minerva Ends, MD  Patient coming from: Home  Chief Complaint  Patient presents with  . Emesis  . Diarrhea      HPI:    [name]  is a 61 y.o. female, Feels EKG stage III baseline creatinine close to 1.6, insulin-dependent type 2 diabetes mellitus, GERD, chronic diastolic dysfunction patient on Lasix, history of gout with recent flare agent was exposed to NSAIDs and colchicine, comes to the hospital with three-day history of diarrhea, generalized weakness and feeling poorly. In the ER she was diagnosed with ARF, hyperkalemia and dehydration and I was called to admit the patient.  She denies any fever or chills, no headache, no chest pain or shortness of breath, no abdominal pain, no dysuria, good appetite, no focal weakness. No blood in stool or urine.    Review of systems:    In addition to the HPI above,   No Fever-chills, No Headache, No changes with Vision or hearing, No problems swallowing food or Liquids, No Chest pain, Cough or Shortness of Breath, No Abdominal pain, No Nausea or Vommitting,   No Blood in stool or Urine, No dysuria, No new skin rashes or bruises, No new joints pains-aches,  No new weakness, tingling, numbness in any extremity, No recent weight gain or loss, No polyuria, polydypsia or polyphagia, No significant Mental Stressors.  A full 10 point Review of Systems was done, except as stated above, all other Review of Systems were negative.   With Past History of the following :    Past Medical  History:  Diagnosis Date  . Asthma    As a child   . Bronchitis   . CHF (congestive heart failure) ([place]) 2011  . Complication of anesthesia   . Diabetes mellitus Dx 1989  . Hepatitis C Dx 2013  . Hypertension Dx 1989  . Obesity   . Pancreatitis 2013  . Refusal of blood transfusions as patient is Jehovah's Witness   . Ulcer ([place]) 2010      Past Surgical History:  Procedure Laterality Date  . CHOLECYSTECTOMY    . EYE SURGERY    .  KNEE ARTHROSCOPY    . TUBAL LIGATION    . TUBAL LIGATION        Social History:     Social History  Substance Use Topics  . Smoking status: Former Smoker    Quit date: 10/25/1980  . Smokeless tobacco: Never Used     Comment: quit smoking in 1982  . Alcohol use Yes     Comment: 3 times in last year         Family History :     Family History  Problem Relation Age of Onset  . Colon cancer Mother   . Heart attack Other   . Heart attack Maternal Grandmother   . Hypertension Sister   . Hypertension Brother   . Diabetes Paternal Grandmother        Home Medications:   Prior to Admission medications   Medication Sig Start Date End Date Taking? Authorizing Provider  acetaminophen-codeine (TYLENOL #3) 300-30 MG tablet Take 1 tablet by mouth every 8 (eight) hours as needed. Patient taking differently: Take 1 tablet by mouth every 8 (eight) hours as needed for moderate pain.  07/27/16  Yes Josalyn Funches, MD  albuterol (PROVENTIL) (2.5 MG/3ML) 0.083% nebulizer solution Take 3 mLs (2.5 mg total) by nebulization every 6 (six) hours as needed for wheezing or shortness of breath. 03/25/15  Yes Arnoldo Morale, MD  albuterol (VENTOLIN HFA) 108 (90 Base) MCG/ACT inhaler Inhale 2 puffs into the lungs every 4 (four) hours as needed for wheezing or shortness of breath. 08/03/16  Yes Josalyn Funches, MD  aspirin 325 MG tablet Take 1 tablet (325 mg total) by mouth daily. 06/27/15  Yes Josalyn Funches, MD  atorvastatin (LIPITOR) 20 MG tablet TAKE 1 TABLET BY  MOUTH DAILY 06/12/16  Yes Josalyn Funches, MD  carvedilol (COREG) 25 MG tablet TAKE 1 TABLET BY MOUTH 2 TIMES DAILY WITH A MEAL. 06/12/16  Yes Josalyn Funches, MD  colchicine 0.6 MG tablet Take 0.5 tablets (0.3 mg total) by mouth daily. 07/28/16  Yes Josalyn Funches, MD  diclofenac sodium (VOLTAREN) 1 % GEL Apply 4 g topically daily as needed (for pain). 11/25/15  Yes Josalyn Funches, MD  exenatide (BYETTA 5 MCG PEN) 5 MCG/0.02ML SOPN injection INJECT 5 MCG INTO THE SKIN 2 TIMES DAILY WITH A MEAL. 08/03/16  Yes Josalyn Funches, MD  ferrous sulfate 325 (65 FE) MG tablet Take 1 tablet (325 mg total) by mouth 2 (two) times daily with a meal. 07/28/16  Yes Josalyn Funches, MD  fluticasone (FLONASE) 50 MCG/ACT nasal spray Place 2 sprays into both nostrils daily. Patient taking differently: Place 2 sprays into both nostrils daily as needed for allergies.  10/01/15  Yes Josalyn Funches, MD  Fluticasone-Salmeterol (ADVAIR DISKUS) 250-50 MCG/DOSE AEPB Inhale 1 puff into the lungs 2 (two) times daily. 08/03/16  Yes Josalyn Funches, MD  furosemide (LASIX) 40 MG tablet Take 40 mg by mouth 3 (three) times daily. Take two tablets by mouth in the morning then take 1 tablet by mouth in the afternoon   Yes Historical Provider, MD  gabapentin (NEURONTIN) 100 MG capsule Take 100 mg by mouth 3 (three) times daily.   Yes Historical Provider, MD  hydrALAZINE (APRESOLINE) 25 MG tablet TAKE 1 TABLET BY MOUTH 3 TIMES DAILY. 06/12/16  Yes Josalyn Funches, MD  hydrOXYzine (ATARAX/VISTARIL) 50 MG tablet Take 1 tablet (50 mg total) by mouth 3 (three) times daily as needed. 07/27/16  Yes Josalyn Funches, MD  ibuprofen (ADVIL,MOTRIN) 800 MG tablet Take 1 tablet (  800 mg total) by mouth 3 (three) times daily. 02/10/16  Yes Delsa Grana, PA-C  insulin aspart (NOVOLOG) 100 UNIT/ML FlexPen Inject 10 Units into the skin 3 (three) times daily with meals. 08/03/16  Yes Josalyn Funches, MD  Insulin Glargine (LANTUS SOLOSTAR) 100 UNIT/ML Solostar Pen  Inject 50 Units into the skin daily at 10 pm. 08/03/16  Yes Josalyn Funches, MD  ketotifen (ZADITOR) 0.025 % ophthalmic solution Place 1 drop into both eyes daily as needed (for dry eyes). 07/27/16  Yes Josalyn Funches, MD  lisinopril (PRINIVIL,ZESTRIL) 40 MG tablet Take 1 tablet (40 mg total) by mouth daily. 11/25/15  Yes Josalyn Funches, MD  ondansetron (ZOFRAN) 8 MG tablet Take 1 tablet (8 mg total) by mouth every 8 (eight) hours as needed for nausea or vomiting. 09/17/15  Yes Josalyn Funches, MD  Potassium Chloride ER 20 MEQ TBCR Take 1 tablet by mouth twice daily with Lasix. 06/12/16  Yes Josalyn Funches, MD  ranitidine (ZANTAC) 150 MG tablet Take 1 tablet (150 mg total) by mouth 2 (two) times daily. 07/27/16  Yes Josalyn Funches, MD  sitaGLIPtin (JANUVIA) 50 MG tablet Take 1 tablet (50 mg total) by mouth daily. 08/03/16  Yes Josalyn Funches, MD  valACYclovir (VALTREX) 500 MG tablet TAKE 1 TABLET BY MOUTH DAILY. 08/17/16  Yes Boykin Nearing, MD  Blood Glucose Monitoring Suppl (TRUE METRIX METER) w/Device KIT 1 each by Does not apply route as needed. 11/25/15   Josalyn Funches, MD  furosemide (LASIX) 40 MG tablet TAKE 2 TABLETS AT 8 AM AND 1 TABLET AT 2 PM Patient not taking: Reported on 08/21/2016 05/11/16   Adriana Mccallum Funches, MD  glucose blood (TRUE METRIX BLOOD GLUCOSE TEST) test strip 1 each by Other route 3 (three) times daily. 11/25/15   Josalyn Funches, MD  Insulin Pen Needle (PX SHORTLENGTH PEN NEEDLES) 31G X 8 MM MISC 1 each by Does not apply route 6 (six) times daily. 07/27/16   Josalyn Funches, MD  Insulin Syringes, Disposable, U-100 1 ML MISC 1 each by Does not apply route 6 (six) times daily. 07/27/16   Josalyn Funches, MD  pregabalin (LYRICA) 100 MG capsule Take 1 capsule (100 mg total) by mouth 3 (three) times daily. Patient not taking: Reported on 08/21/2016 07/27/16   Boykin Nearing, MD     Allergies:     Allergies  Allergen Reactions  . Shellfish Allergy Anaphylaxis and Swelling  .  Diazepam Other (See Comments)    "felt like out of body experience"  . Morphine And Related Itching     Physical Exam:   Vitals  Blood pressure 116/60, pulse 65, temperature 98.6 F (37 C), temperature source Oral, resp. rate 16, SpO2 96 %.   1. General Middle-aged obese African-American female lying in bed in NAD,     2. Normal affect and insight, Not Suicidal or Homicidal, Awake Alert, Oriented X 3.  3. No F.N deficits, ALL C.Nerves Intact, Strength 5/5 all 4 extremities, Sensation intact all 4 extremities, Plantars down going.  4. Ears and Eyes appear Normal, Conjunctivae clear, PERRLA. Moist Oral Mucosa.  5. Supple Neck, No JVD, No cervical lymphadenopathy appriciated, No Carotid Bruits.  6. Symmetrical Chest wall movement, Good air movement bilaterally, CTAB.  7. RRR, No Gallops, Rubs or Murmurs, No Parasternal Heave.  8. Positive Bowel Sounds, Abdomen Soft, No tenderness, No organomegaly appriciated,No rebound -guarding or rigidity.  9.  No Cyanosis, Normal Skin Turgor, No Skin Rash or Bruise.  10. Good muscle tone,  joints appear  normal , no effusions, Normal ROM.  11. No Palpable Lymph Nodes in Neck or Axillae      Data Review:    CBC  Recent Labs Lab 08/21/16 1345 08/21/16 1540  WBC 6.3 6.8  HGB 11.3* 11.8*  HCT 35.2* 37.0  PLT 226 246  MCV 86.9 87.3  MCH 27.9 27.8  MCHC 32.1 31.9  RDW 14.2 14.3   ------------------------------------------------------------------------------------------------------------------  Chemistries   Recent Labs Lab 08/21/16 1345 08/21/16 1540  NA 134* 135  K 6.4* 6.4*  CL 107 107  CO2 18* 18*  GLUCOSE 245* 228*  BUN 90* 91*  CREATININE 2.61* 2.69*  CALCIUM 8.9 8.9  AST 19  --   ALT 21  --   ALKPHOS 86  --   BILITOT 0.7  --    ------------------------------------------------------------------------------------------------------------------ CrCl cannot be calculated (Unknown ideal  weight.). ------------------------------------------------------------------------------------------------------------------ No results for input(s): TSH, T4TOTAL, T3FREE, THYROIDAB in the last 72 hours.  Invalid input(s): FREET3  Coagulation profile No results for input(s): INR, PROTIME in the last 168 hours. ------------------------------------------------------------------------------------------------------------------- No results for input(s): DDIMER in the last 72 hours. -------------------------------------------------------------------------------------------------------------------  Cardiac Enzymes No results for input(s): CKMB, TROPONINI, MYOGLOBIN in the last 168 hours.  Invalid input(s): CK ------------------------------------------------------------------------------------------------------------------    Component Value Date/Time   BNP 76.1 03/18/2015 0514     ---------------------------------------------------------------------------------------------------------------  Urinalysis    Component Value Date/Time   COLORURINE YELLOW 05/29/2010 2100   APPEARANCEUR CLEAR 05/29/2010 2100   LABSPEC 1.013 05/29/2010 2100   PHURINE 5.5 05/29/2010 2100   GLUCOSEU NEGATIVE 05/29/2010 2100   HGBUR NEGATIVE 05/29/2010 2100   BILIRUBINUR neg 04/29/2015 [place] NEGATIVE 05/29/2010 2100   PROTEINUR >=300 04/29/2015 1029   PROTEINUR 30 (A) 05/29/2010 2100   UROBILINOGEN 0.2 04/29/2015 1029   UROBILINOGEN 0.2 05/29/2010 2100   NITRITE neg 04/29/2015 1029   NITRITE NEGATIVE 05/29/2010 2100   LEUKOCYTESUR Negative 04/29/2015 1029    ----------------------------------------------------------------------------------------------------------------   Imaging Results:    No results found.  My personal review of EKG: Rhythm NSR,   no Acute ST changes   Assessment & Plan:       1. ARF on chronic kidney disease stage III with hyperkalemia. Caused by a combination  of diarrhea induced dehydration, ACE, NSAID and colchicine exposure. Will be kept on telemetry for 23 our observation, IV fluid bolus and maintenance, hyperkalemia protocol has been followed, monitor on telemetry, repeat potassium this evening and again BMP tomorrow morning. Avoid nephrotoxins.  2. Gout. Check uric acid. For now hydrate and monitor. Minimal toe ache, no joint tenderness swelling or redness on exam. Supportive care.  3. GERD. Continue H2 blocker.  4. Anemia of chronic disease and iron deficiency. No acute issues, expect mild H&H dropped from dilution.  5. Morbid obesity. Follow with PCP for weight loss.  6. HTN. Blood pressure borderline due to dehydration, for now low-dose hydralazine and low-dose Coreg only and monitor.  7. Diabetic neuropathy. Low-dose Neurontin and monitor.  8. Dyslipidemia. Continue home dose statin.  9. History of chronic diastolic CHF. Currently dehydrated, hydrate, last EF close to 60%.  10. DM type II. Check A1c, Lantus and sliding scale.    DVT Prophylaxis Heparin    AM Labs Ordered, also please review Full Orders  Family Communication: Admission, patients condition and plan of care including tests being ordered have been discussed with the patient  who indicates understanding and agree with the plan and Code Status.  Code Status Full  Likely DC to  Home in am  Condition Fair  Consults called: None    Admission status: Obs    Time spent in minutes : 30   Mayes Sangiovanni K M.D on 08/21/2016 at 5:16 PM  Between 7am to 7pm - Pager - [phone]. After 7pm go to [email or web address] - password [facility]  [facility] - Office  [phone]

## 2016-08-21 NOTE — ED Triage Notes (Signed)
Sour stomach and diarrhea and toes have gout x 2 days

## 2016-08-21 NOTE — ED Notes (Signed)
This second unit of 1L NS not started as there was already 1L infusing.

## 2016-08-22 LAB — URINALYSIS, ROUTINE W REFLEX MICROSCOPIC
Bacteria, UA: NONE SEEN
Bilirubin Urine: NEGATIVE
Glucose, UA: NEGATIVE mg/dL
Hgb urine dipstick: NEGATIVE
Ketones, ur: NEGATIVE mg/dL
Leukocytes, UA: NEGATIVE
Nitrite: NEGATIVE
Protein, ur: 30 mg/dL — AB
Specific Gravity, Urine: 1.01 (ref 1.005–1.030)
pH: 5 (ref 5.0–8.0)

## 2016-08-22 LAB — CBC
HCT: 32.3 % — ABNORMAL LOW (ref 36.0–46.0)
Hemoglobin: 10.1 g/dL — ABNORMAL LOW (ref 12.0–15.0)
MCH: 27.3 pg (ref 26.0–34.0)
MCHC: 31.3 g/dL (ref 30.0–36.0)
MCV: 87.3 fL (ref 78.0–100.0)
Platelets: 202 10*3/uL (ref 150–400)
RBC: 3.7 MIL/uL — ABNORMAL LOW (ref 3.87–5.11)
RDW: 14.4 % (ref 11.5–15.5)
WBC: 6 10*3/uL (ref 4.0–10.5)

## 2016-08-22 LAB — BASIC METABOLIC PANEL
Anion gap: 14 (ref 5–15)
BUN: 84 mg/dL — ABNORMAL HIGH (ref 6–20)
CO2: 19 mmol/L — ABNORMAL LOW (ref 22–32)
Calcium: 8.3 mg/dL — ABNORMAL LOW (ref 8.9–10.3)
Chloride: 103 mmol/L (ref 101–111)
Creatinine, Ser: 2.43 mg/dL — ABNORMAL HIGH (ref 0.44–1.00)
GFR calc Af Amer: 24 mL/min — ABNORMAL LOW (ref >60)
GFR calc non Af Amer: 21 mL/min — ABNORMAL LOW (ref >60)
Glucose, Bld: 168 mg/dL — ABNORMAL HIGH (ref 65–99)
Potassium: 4.2 mmol/L (ref 3.5–5.1)
Sodium: 136 mmol/L (ref 135–145)

## 2016-08-22 LAB — POTASSIUM
Potassium: 4.2 mmol/L (ref 3.5–5.1)
Potassium: 4.6 mmol/L (ref 3.5–5.1)

## 2016-08-22 LAB — GLUCOSE, CAPILLARY
Glucose-Capillary: 186 mg/dL — ABNORMAL HIGH (ref 65–99)
Glucose-Capillary: 206 mg/dL — ABNORMAL HIGH (ref 65–99)

## 2016-08-22 LAB — MAGNESIUM: Magnesium: 1.7 mg/dL (ref 1.7–2.4)

## 2016-08-22 LAB — SODIUM, URINE, RANDOM: Sodium, Ur: 34 mmol/L

## 2016-08-22 MED ORDER — HYDRALAZINE HCL 50 MG PO TABS
50.0000 mg | ORAL_TABLET | Freq: Three times a day (TID) | ORAL | Status: DC
  Filled 2016-08-22: qty 1

## 2016-08-22 MED ORDER — SODIUM CHLORIDE 0.9 % IV BOLUS (SEPSIS)
500.0000 mL | Freq: Once | INTRAVENOUS | Status: AC
  Administered 2016-08-22: 500 mL via INTRAVENOUS

## 2016-08-22 MED ORDER — CARVEDILOL 12.5 MG PO TABS: 6.2500 mg | ORAL_TABLET | Freq: Two times a day (BID) | ORAL | 0 refills | Status: DC

## 2016-08-22 NOTE — Discharge Summary (Addendum)
[name] ORV:[id number] DOB: 08-Aug-1955 DOA: 08/21/2016  PCP: Minerva Ends, MD  Admit date: 08/21/2016  Discharge date: 08/22/2016  Admitted From: Home   Disposition:  Home   Recommendations for Outpatient Follow-up:   Follow up with PCP in 1-2 weeks  PCP Please obtain BMP/CBC, 2 view CXR in 1week,  (see Discharge instructions)   PCP Please follow up on the following pending results: None   Home Health: None   Equipment/Devices: None  Consultations: None Discharge Condition: Stable   CODE STATUS: Full   Diet Recommendation: Diet heart healthy/carb modified     Chief Complaint  Patient presents with  . Emesis  . Diarrhea     Brief history of present illness from the day of admission and additional interim summary    [name]  is a 61 y.o. female, Feels EKG stage III baseline creatinine close to 1.6, insulin-dependent type 2 diabetes mellitus, GERD, chronic diastolic dysfunction patient on Lasix, history of gout with recent flare agent was exposed to NSAIDs and colchicine, comes to the hospital with three-day history of diarrhea, generalized weakness and feeling poorly. In the ER she was diagnosed with ARF, hyperkalemia and dehydration and I was called to admit the patient.                                                                 Hospital Course    1. ARF on chronic kidney disease stage III with hyperkalemia. Caused by a combination of diarrhea induced dehydration, ACE, NSAID and colchicine exposure. Diarrhea likely caused by colchicine, she was kept on telemetry for 23 our observation, he received IV fluid bolus and maintenance, hyperkalemia protocol was followed, now has good urine output, potassium is normal, creatinine has trended down, she is symptom free. Diarrhea seems to have resolved.  Continue to hold colchicine and offending medications. Follow with PCP in 3 days note colchicine, NSAIDs, Lasix, potassium supplementations have been stopped for now kindly recheck BMP and readdress these medications as needed.  2. Gout. Currently joint pain free, uric acid was still elevated at 10.5, no joint tenderness swelling or redness on exam. Supportive care. If gout becomes an issue steroid treatment and colchicine can be introduced with caution.  3. GERD. Continue H2 blocker.  4. Anemia of chronic disease and iron deficiency. No acute issues, mild H&H dropped from dilution.  5. Morbid obesity. Follow with PCP for weight loss.  6. HTN. Blood pressure borderline due to dehydration, for now low-dose hydralazine and low-dose Coreg only and monitor.  7. Diabetic neuropathy. Low-dose Neurontin and monitor.  8. Dyslipidemia. Continue home dose statin.  9. History of chronic diastolic CHF. As been hydrated, medications as in #1 held for now which include ACE inhibitor and diuretics, PCP to readdress next visit, last EF  close to 60%.  10. DM type II. Continue home regimen follow with PCP 3-4 days.   Discharge diagnosis     Principal Problem:   Hyperkalemia Active Problems:   Diabetes mellitus type 2  Chronic diastolic CHF (congestive heart failure), NYHA class 2 (HCC)   Anemia in stage 3 chronic kidney disease   CKD (chronic kidney disease) stage 3, GFR 30-59 ml/min   GERD (gastroesophageal reflux disease)    Discharge instructions    Discharge Instructions    Discharge instructions    Complete by:  As directed    Follow with Primary MD Minerva Ends, MD in 3 days   Get CBC, CMP, 2 view Chest X [place] checked  by Primary MD or SNF MD in 3 days ( we routinely change or add medications that can affect your baseline labs and fluid status, therefore we recommend that you get the mentioned basic workup next visit with your PCP, your PCP may decide not to get them or  add new tests based on their clinical decision)  Activity: As tolerated with Full fall precautions use walker/cane & assistance as needed  Disposition Home    Diet:   Diet heart healthy/carb modified    Accuchecks 4 times/day, Once in AM empty stomach and then before each meal. Log in all results and show them to your Prim.MD in 3 days. If any glucose reading is under 80 or above 300 call your Prim MD immidiately. Follow Low glucose instructions for glucose under 80 as instructed. .  For Heart failure patients - Check your Weight same time everyday, if you gain over 2 pounds, or you develop in leg swelling, experience more shortness of breath or chest pain, call your Primary MD immediately. Follow Cardiac Low Salt Diet and 1.5 lit/day fluid restriction.  On your next visit with your primary care physician please Get Medicines reviewed and adjusted.  Please request your Prim.MD to go over all Hospital Tests and Procedure/Radiological results at the follow up, please get all Hospital records sent to your Prim MD by signing hospital release before you go home.  If you experience worsening of your admission symptoms, develop shortness of breath, life threatening emergency, suicidal or homicidal thoughts you must seek medical attention immediately by calling 911 or calling your MD immediately  if symptoms less severe.  You Must read complete instructions/literature along with all the possible adverse reactions/side effects for all the Medicines you take and that have been prescribed to you. Take any new Medicines after you have completely understood and accpet all the possible adverse reactions/side effects.   Do not drive, operate heavy machinery, perform activities at heights, swimming or participation in water activities or provide baby sitting services if your were admitted for syncope or siezures until you have seen by Primary MD or a Neurologist and advised to do so again.  Do not drive  when taking Pain medications.    Do not take more than prescribed Pain, Sleep and Anxiety Medications  Special Instructions: If you have smoked or chewed Tobacco  in the last 2 yrs please stop smoking, stop any regular Alcohol  and or any Recreational drug use.  Wear Seat belts while driving.   Please note  You were cared for by a hospitalist during your hospital stay. If you have any questions about your discharge medications or the care you received while you were in the hospital after you are discharged, you can call the unit and asked to speak  with the hospitalist on call if the hospitalist that took care of you is not available. Once you are discharged, your primary care physician will handle any further medical issues. Please note that NO REFILLS for any discharge medications will be authorized once you are discharged, as it is imperative that you return to your primary care physician (or establish a relationship with a primary care physician if you do not have one) for your aftercare needs so that they can reassess your need for medications and monitor your lab values.   Increase activity slowly    Complete by:  As directed       Discharge Medications   Allergies as of 08/22/2016      Reactions   Shellfish Allergy Anaphylaxis, Swelling   Diazepam Other (See Comments)   "felt like out of body experience"   Morphine And Related Itching      Medication List    STOP taking these medications   colchicine 0.6 MG tablet   furosemide 40 MG tablet Commonly known as:  LASIX   ibuprofen 800 MG tablet Commonly known as:  ADVIL,MOTRIN   lisinopril 40 MG tablet Commonly known as:  PRINIVIL,ZESTRIL   Potassium Chloride ER 20 MEQ Tbcr     TAKE these medications   acetaminophen-codeine 300-30 MG tablet Commonly known as:  TYLENOL #3 Take 1 tablet by mouth every 8 (eight) hours as needed. What changed:  reasons to take this   albuterol (2.5 MG/3ML) 0.083% nebulizer  solution Commonly known as:  PROVENTIL Take 3 mLs (2.5 mg total) by nebulization every 6 (six) hours as needed for wheezing or shortness of breath.   albuterol 108 (90 Base) MCG/ACT inhaler Commonly known as:  VENTOLIN HFA Inhale 2 puffs into the lungs every 4 (four) hours as needed for wheezing or shortness of breath.   aspirin 325 MG tablet Take 1 tablet (325 mg total) by mouth daily.   atorvastatin 20 MG tablet Commonly known as:  LIPITOR TAKE 1 TABLET BY MOUTH DAILY   carvedilol 12.5 MG tablet Commonly known as:  COREG Take 0.5 tablets (6.25 mg total) by mouth 2 (two) times daily with a meal. What changed:  See the new instructions.   diclofenac sodium 1 % Gel Commonly known as:  VOLTAREN Apply 4 g topically daily as needed (for pain).   exenatide 5 MCG/0.02ML Sopn injection Commonly known as:  BYETTA 5 MCG PEN INJECT 5 MCG INTO THE SKIN 2 TIMES DAILY WITH A MEAL.   ferrous sulfate 325 (65 FE) MG tablet Take 1 tablet (325 mg total) by mouth 2 (two) times daily with a meal.   fluticasone 50 MCG/ACT nasal spray Commonly known as:  FLONASE Place 2 sprays into both nostrils daily. What changed:  when to take this  reasons to take this   Fluticasone-Salmeterol 250-50 MCG/DOSE Aepb Commonly known as:  ADVAIR DISKUS Inhale 1 puff into the lungs 2 (two) times daily.   gabapentin 100 MG capsule Commonly known as:  NEURONTIN Take 100 mg by mouth 3 (three) times daily.   glucose blood test strip Commonly known as:  TRUE METRIX BLOOD GLUCOSE TEST 1 each by Other route 3 (three) times daily.   hydrALAZINE 25 MG tablet Commonly known as:  APRESOLINE TAKE 1 TABLET BY MOUTH 3 TIMES DAILY.   hydrOXYzine 50 MG tablet Commonly known as:  ATARAX/VISTARIL Take 1 tablet (50 mg total) by mouth 3 (three) times daily as needed.   insulin aspart 100 UNIT/ML FlexPen Commonly  known as:  NOVOLOG Inject 10 Units into the skin 3 (three) times daily with meals.   Insulin Glargine  100 UNIT/ML Solostar Pen Commonly known as:  LANTUS SOLOSTAR Inject 50 Units into the skin daily at 10 pm.   Insulin Pen Needle 31G X 8 MM Misc Commonly known as:  PX SHORTLENGTH PEN NEEDLES 1 each by Does not apply route 6 (six) times daily.   Insulin Syringes (Disposable) U-100 1 ML Misc 1 each by Does not apply route 6 (six) times daily.   ketotifen 0.025 % ophthalmic solution Commonly known as:  ZADITOR Place 1 drop into both eyes daily as needed (for dry eyes).   ondansetron 8 MG tablet Commonly known as:  ZOFRAN Take 1 tablet (8 mg total) by mouth every 8 (eight) hours as needed for nausea or vomiting.   pregabalin 100 MG capsule Commonly known as:  LYRICA Take 1 capsule (100 mg total) by mouth 3 (three) times daily.   ranitidine 150 MG tablet Commonly known as:  ZANTAC Take 1 tablet (150 mg total) by mouth 2 (two) times daily.   sitaGLIPtin 50 MG tablet Commonly known as:  JANUVIA Take 1 tablet (50 mg total) by mouth daily.   TRUE METRIX METER w/Device Kit 1 each by Does not apply route as needed.   valACYclovir 500 MG tablet Commonly known as:  VALTREX TAKE 1 TABLET BY MOUTH DAILY.       Follow-up Information    Minerva Ends, MD. Schedule an appointment as soon as possible for a visit in 3 day(s).   Specialty:  Family Medicine Contact information: [address] [place] [address] [phone]           Major procedures and Radiology Reports - PLEASE review detailed and final reports thoroughly  -        No results found.  Micro Results     No results found for this or any previous visit (from the past 240 hour(s)).  Today   Subjective    [name] today has no headache,no chest abdominal pain,no new weakness tingling or numbness, feels much better wants to go home today.     Objective   Blood pressure 119/80, pulse 74, temperature 98.2 F (36.8 C), temperature source Oral, resp. rate 18, height _0  (1.727 m), weight  128.9 kg (284 lb 3.2 oz), SpO2 98 %.   Intake/Output Summary (Last 24 hours) at 08/22/16 1049 Last data filed at 08/22/16 1000  Gross per 24 hour  Intake             2040 ml  Output              550 ml  Net             1490 ml    Exam Awake Alert, Oriented x 3, No new F.N deficits, Normal affect Mulat.AT,PERRAL Supple Neck,No JVD, No cervical lymphadenopathy appriciated.  Symmetrical Chest wall movement, Good air movement bilaterally, CTAB RRR,No Gallops,Rubs or new Murmurs, No Parasternal Heave +ve B.Sounds, Abd Soft, Non tender, No organomegaly appriciated, No rebound -guarding or rigidity. No Cyanosis, Clubbing or edema, No new Rash or bruise   Data Review   CBC w Diff: Lab Results  Component Value Date   WBC 6.0 08/22/2016   HGB 10.1 (L) 08/22/2016   HCT 32.3 (L) 08/22/2016   PLT 202 08/22/2016   LYMPHOPCT 33 09/14/2015   MONOPCT 7 09/14/2015   EOSPCT 5 09/14/2015  BASOPCT 1 09/14/2015    CMP: Lab Results  Component Value Date   NA 136 08/22/2016   K 4.2 08/22/2016   CL 103 08/22/2016   CO2 19 (L) 08/22/2016   BUN 84 (H) 08/22/2016   CREATININE 2.43 (H) 08/22/2016   CREATININE 2.02 (H) 07/27/2016   PROT 7.6 08/21/2016   ALBUMIN 3.9 08/21/2016   BILITOT 0.7 08/21/2016   ALKPHOS 86 08/21/2016   AST 19 08/21/2016   ALT 21 08/21/2016  .   Total Time in preparing paper work, data evaluation and todays exam - 35 minutes  Thurnell Lose M.D on 08/22/2016 at 10:49 AM  [facility]   Office  [phone]

## 2016-08-22 NOTE — Discharge Instructions (Signed)
Follow with Primary MD Minerva Ends, MD in 3 days   Get CBC, CMP, 2 view Chest X [place] checked  by Primary MD or SNF MD in 3 days ( we routinely change or add medications that can affect your baseline labs and fluid status, therefore we recommend that you get the mentioned basic workup next visit with your PCP, your PCP may decide not to get them or add new tests based on their clinical decision)  Activity: As tolerated with Full fall precautions use walker/cane & assistance as needed  Disposition Home    Diet:   Diet heart healthy/carb modified.  Accuchecks 4 times/day, Once in AM empty stomach and then before each meal. Log in all results and show them to your Prim.MD in 3 days. If any glucose reading is under 80 or above 300 call your Prim MD immidiately. Follow Low glucose instructions for glucose under 80 as instructed.   For Heart failure patients - Check your Weight same time everyday, if you gain over 2 pounds, or you develop in leg swelling, experience more shortness of breath or chest pain, call your Primary MD immediately. Follow Cardiac Low Salt Diet and 1.5 lit/day fluid restriction.  On your next visit with your primary care physician please Get Medicines reviewed and adjusted.  Please request your Prim.MD to go over all Hospital Tests and Procedure/Radiological results at the follow up, please get all Hospital records sent to your Prim MD by signing hospital release before you go home.  If you experience worsening of your admission symptoms, develop shortness of breath, life threatening emergency, suicidal or homicidal thoughts you must seek medical attention immediately by calling 911 or calling your MD immediately  if symptoms less severe.  You Must read complete instructions/literature along with all the possible adverse reactions/side effects for all the Medicines you take and that have been prescribed to you. Take any new Medicines after you have completely understood  and accpet all the possible adverse reactions/side effects.   Do not drive, operate heavy machinery, perform activities at heights, swimming or participation in water activities or provide baby sitting services if your were admitted for syncope or siezures until you have seen by Primary MD or a Neurologist and advised to do so again.  Do not drive when taking Pain medications.    Do not take more than prescribed Pain, Sleep and Anxiety Medications  Special Instructions: If you have smoked or chewed Tobacco  in the last 2 yrs please stop smoking, stop any regular Alcohol  and or any Recreational drug use.  Wear Seat belts while driving.   Please note  You were cared for by a hospitalist during your hospital stay. If you have any questions about your discharge medications or the care you received while you were in the hospital after you are discharged, you can call the unit and asked to speak with the hospitalist on call if the hospitalist that took care of you is not available. Once you are discharged, your primary care physician will handle any further medical issues. Please note that NO REFILLS for any discharge medications will be authorized once you are discharged, as it is imperative that you return to your primary care physician (or establish a relationship with a primary care physician if you do not have one) for your aftercare needs so that they can reassess your need for medications and monitor your lab values.

## 2016-08-22 NOTE — Progress Notes (Signed)
Pt is c/a/ox3, vitals within normal limits. Tele was discontinued per MD order. Pt denies complaints at this time. Pt is ready for discharge, accompanied by multiple family members.

## 2016-08-23 LAB — HEMOGLOBIN A1C
Hgb A1c MFr Bld: 9.2 % — ABNORMAL HIGH (ref 4.8–5.6)
Mean Plasma Glucose: 217 mg/dL

## 2016-08-24 MED FILL — !LANTUS SOLOSTAR 100UNITS/M: 100 | 30 days supply | Qty: 15 | Fill #5

## 2016-08-24 MED FILL — $novoLOG 100 UNITS/ML VIAL: 100 | 28 days supply | Qty: 10 | Fill #4

## 2016-08-25 MED FILL — $BYETTA 5 MCG DOSE PEN INJ: 5 | 30 days supply | Qty: 1 | Fill #3

## 2016-08-27 ENCOUNTER — Encounter: Payer: Self-pay | Admitting: Obstetrics and Gynecology

## 2016-08-31 ENCOUNTER — Other Ambulatory Visit: Payer: Self-pay | Admitting: Family Medicine

## 2016-08-31 MED FILL — FERROUS SULFATE 325 MG TAB: 325 (65 FE) | 30 days supply | Qty: 60 | Fill #1

## 2016-08-31 MED FILL — raNITIdine HCL 150 MG TABS: 150 | 30 days supply | Qty: 60 | Fill #1

## 2016-09-01 ENCOUNTER — Ambulatory Visit: Payer: Self-pay | Attending: Family Medicine | Admitting: Family Medicine

## 2016-09-01 ENCOUNTER — Encounter: Payer: Self-pay | Admitting: Family Medicine

## 2016-09-01 VITALS — BP 140/88 | HR 64 | Temp 98.4°F | Ht 68.0 in | Wt 296.0 lb

## 2016-09-01 DIAGNOSIS — N183 Chronic kidney disease, stage 3 unspecified: Secondary | ICD-10-CM

## 2016-09-01 DIAGNOSIS — E875 Hyperkalemia: Secondary | ICD-10-CM

## 2016-09-01 DIAGNOSIS — I5032 Chronic diastolic (congestive) heart failure: Secondary | ICD-10-CM

## 2016-09-01 DIAGNOSIS — I5022 Chronic systolic (congestive) heart failure: Secondary | ICD-10-CM | POA: Insufficient documentation

## 2016-09-01 DIAGNOSIS — J45909 Unspecified asthma, uncomplicated: Secondary | ICD-10-CM

## 2016-09-01 DIAGNOSIS — N179 Acute kidney failure, unspecified: Secondary | ICD-10-CM

## 2016-09-01 DIAGNOSIS — Z6841 Body Mass Index (BMI) 40.0 and over, adult: Secondary | ICD-10-CM | POA: Insufficient documentation

## 2016-09-01 DIAGNOSIS — E118 Type 2 diabetes mellitus with unspecified complications: Secondary | ICD-10-CM

## 2016-09-01 DIAGNOSIS — I130 Hypertensive heart and chronic kidney disease with heart failure and stage 1 through stage 4 chronic kidney disease, or unspecified chronic kidney disease: Secondary | ICD-10-CM | POA: Insufficient documentation

## 2016-09-01 DIAGNOSIS — I10 Essential (primary) hypertension: Secondary | ICD-10-CM

## 2016-09-01 DIAGNOSIS — E1165 Type 2 diabetes mellitus with hyperglycemia: Secondary | ICD-10-CM

## 2016-09-01 DIAGNOSIS — Z79899 Other long term (current) drug therapy: Secondary | ICD-10-CM | POA: Insufficient documentation

## 2016-09-01 DIAGNOSIS — E1142 Type 2 diabetes mellitus with diabetic polyneuropathy: Secondary | ICD-10-CM

## 2016-09-01 DIAGNOSIS — Z87891 Personal history of nicotine dependence: Secondary | ICD-10-CM | POA: Insufficient documentation

## 2016-09-01 DIAGNOSIS — Z7982 Long term (current) use of aspirin: Secondary | ICD-10-CM | POA: Insufficient documentation

## 2016-09-01 DIAGNOSIS — E1122 Type 2 diabetes mellitus with diabetic chronic kidney disease: Secondary | ICD-10-CM | POA: Insufficient documentation

## 2016-09-01 DIAGNOSIS — Z7951 Long term (current) use of inhaled steroids: Secondary | ICD-10-CM | POA: Insufficient documentation

## 2016-09-01 DIAGNOSIS — Z794 Long term (current) use of insulin: Secondary | ICD-10-CM | POA: Insufficient documentation

## 2016-09-01 LAB — CBC
HCT: 30 % — ABNORMAL LOW (ref 35.0–45.0)
Hemoglobin: 9.8 g/dL — ABNORMAL LOW (ref 11.7–15.5)
MCH: 28.3 pg (ref 27.0–33.0)
MCHC: 32.7 g/dL (ref 32.0–36.0)
MCV: 86.7 fL (ref 80.0–100.0)
MPV: 10.6 fL (ref 7.5–12.5)
Platelets: 196 10*3/uL (ref 140–400)
RBC: 3.46 MIL/uL — ABNORMAL LOW (ref 3.80–5.10)
RDW: 15 % (ref 11.0–15.0)
WBC: 6.2 10*3/uL (ref 3.8–10.8)

## 2016-09-01 LAB — GLUCOSE, POCT (MANUAL RESULT ENTRY): POC Glucose: 121 mg/dl — AB (ref 70–99)

## 2016-09-01 LAB — BASIC METABOLIC PANEL
BUN: 27 mg/dL — ABNORMAL HIGH (ref 7–25)
CO2: 20 mmol/L (ref 20–31)
Calcium: 8.8 mg/dL (ref 8.6–10.4)
Chloride: 111 mmol/L — ABNORMAL HIGH (ref 98–110)
Creat: 1.44 mg/dL — ABNORMAL HIGH (ref 0.50–1.05)
Glucose, Bld: 98 mg/dL (ref 65–99)
Potassium: 5.1 mmol/L (ref 3.5–5.3)
Sodium: 140 mmol/L (ref 135–146)

## 2016-09-01 MED ORDER — FLUTICASONE-SALMETEROL 250-50 MCG/DOSE IN AEPB: 1.0000 | INHALATION_SPRAY | Freq: Two times a day (BID) | RESPIRATORY_TRACT | 3 refills | Status: DC

## 2016-09-01 MED ORDER — GABAPENTIN 100 MG PO CAPS: 200.0000 mg | ORAL_CAPSULE | Freq: Three times a day (TID) | ORAL | 2 refills | Status: DC

## 2016-09-01 MED ORDER — FLUTICASONE-SALMETEROL 250-50 MCG/DOSE IN AEPB: 1.0000 | INHALATION_SPRAY | Freq: Two times a day (BID) | RESPIRATORY_TRACT | 5 refills | Status: DC

## 2016-09-01 MED ORDER — CARVEDILOL 12.5 MG PO TABS: 12.5000 mg | ORAL_TABLET | Freq: Two times a day (BID) | ORAL | 5 refills | Status: DC

## 2016-09-01 MED FILL — ?GABAPENTIN 100 MG CAP: 30 days supply | Qty: 180 | Fill #0

## 2016-09-01 MED FILL — ?CARVEDILOL 12.5 MG TABLET: 12.5 | 30 days supply | Qty: 60 | Fill #0

## 2016-09-01 NOTE — Patient Instructions (Signed)
[name] was seen today for hospitalization follow-up.  Diagnoses and all orders for this visit:  Uncontrolled type 2 diabetes mellitus with complication, unspecified long term insulin use status (HCC) -     POCT glucose (manual entry)  CKD (chronic kidney disease) stage 3, GFR 30-59 ml/min -     CBC -     Basic Metabolic Panel  AKI (acute kidney injury) ([place]) -     CBC -     Basic Metabolic Panel  Essential hypertension -     carvedilol (COREG) 12.5 MG tablet; Take 1 tablet (12.5 mg total) by mouth 2 (two) times daily with a meal.  Chronic diastolic CHF (congestive heart failure), NYHA class 2 (HCC) -     Brain natriuretic peptide  Uncomplicated asthma, unspecified asthma severity, unspecified whether persistent -     Fluticasone-Salmeterol (ADVAIR DISKUS) 250-50 MCG/DOSE AEPB; Inhale 1 puff into the lungs 2 (two) times daily.  Diabetic polyneuropathy associated with type 2 diabetes mellitus (HCC) -     gabapentin (NEURONTIN) 100 MG capsule; Take 2 capsules (200 mg total) by mouth 3 (three) times daily.   F/u in 2 weeks for BP check and headache   Dr. Adrian Blackwater

## 2016-09-01 NOTE — Assessment & Plan Note (Signed)
A: HTN BP near goal, recent changes due to AKI on setting of CKD.  Med: compliant but none today P: Increase coreg to 12.5 mg BID

## 2016-09-01 NOTE — Assessment & Plan Note (Signed)
Uncontrolled diabetes with neuropathy Continue current diabetes regimen Increase gabapentin from 100 to 200 mg TID

## 2016-09-01 NOTE — Progress Notes (Addendum)
Subjective:  Patient ID: [name], female    DOB: 12/29/1955  Age: 61 y.o. MRN: [id number]  CC: Hospitalization Follow-up   HPI [name] has morbid obesity, OSA, HTN, uncontrolled diabetes, CKD stage 3, diastolic CHF, presents for   1. Hospitalization follow up: she was admitted from 2/9-2/04/2017 from vomiting and diarrhea. She was found to have Acute on chronic renal kidney disease in setting of NSAID, colchicine use and dehydration. Her Cr peaked at 2.69, K+ 6.4, bicarb 18. Blood sugars was 245. Cr improved to 2.43, K+ 4.2, bicarb 19 by time of discharge. Her lisinopril 40 mg has been discontinued. Her coreg has been decreased from 25 mg BID to 6.25 mg BID. She was taking lasix and potassium prn swelling and this has been discontinued. She continues to take hydralazine 25 mg TID. She has not taking her BP medicine yet this AM. She reports headache and pain in her feet. She is taking gabapentin 100 mg BID. She denies diarrhea, emesis and leg swelling.     Social History  Substance Use Topics  . Smoking status: Former Smoker    Quit date: 10/25/1980  . Smokeless tobacco: Never Used     Comment: quit smoking in 1982  . Alcohol use Yes     Comment: 3 times in last year    Outpatient Medications Prior to Visit  Medication Sig Dispense Refill  . acetaminophen-codeine (TYLENOL #3) 300-30 MG tablet Take 1 tablet by mouth every 8 (eight) hours as needed. (Patient taking differently: Take 1 tablet by mouth every 8 (eight) hours as needed for moderate pain. ) 90 tablet 2  . albuterol (PROVENTIL) (2.5 MG/3ML) 0.083% nebulizer solution Take 3 mLs (2.5 mg total) by nebulization every 6 (six) hours as needed for wheezing or shortness of breath. 150 mL 1  . albuterol (VENTOLIN HFA) 108 (90 Base) MCG/ACT inhaler Inhale 2 puffs into the lungs every 4 (four) hours as needed for wheezing or shortness of breath. 54 g 3  . aspirin 325 MG tablet Take 1 tablet (325 mg total) by mouth daily. 30  tablet 3  . atorvastatin (LIPITOR) 20 MG tablet TAKE 1 TABLET BY MOUTH DAILY 30 tablet 3  . Blood Glucose Monitoring Suppl (TRUE METRIX METER) w/Device KIT 1 each by Does not apply route as needed. 1 kit 0  . carvedilol (COREG) 12.5 MG tablet Take 0.5 tablets (6.25 mg total) by mouth 2 (two) times daily with a meal. 60 tablet 0  . diclofenac sodium (VOLTAREN) 1 % GEL Apply 4 g topically daily as needed (for pain). 100 g 2  . exenatide (BYETTA 5 MCG PEN) 5 MCG/0.02ML SOPN injection INJECT 5 MCG INTO THE SKIN 2 TIMES DAILY WITH A MEAL. 3 pen 3  . ferrous sulfate 325 (65 FE) MG tablet Take 1 tablet (325 mg total) by mouth 2 (two) times daily with a meal. 60 tablet 3  . fluticasone (FLONASE) 50 MCG/ACT nasal spray Place 2 sprays into both nostrils daily. (Patient taking differently: Place 2 sprays into both nostrils daily as needed for allergies. ) 16 g 1  . Fluticasone-Salmeterol (ADVAIR DISKUS) 250-50 MCG/DOSE AEPB Inhale 1 puff into the lungs 2 (two) times daily. 180 each 3  . gabapentin (NEURONTIN) 100 MG capsule Take 100 mg by mouth 3 (three) times daily.    [name] glucose blood (TRUE METRIX BLOOD GLUCOSE TEST) test strip 1 each by Other route 3 (three) times daily. 100 each 11  . hydrALAZINE (APRESOLINE) 25  MG tablet TAKE 1 TABLET BY MOUTH 3 TIMES DAILY. 90 tablet 3  . hydrOXYzine (ATARAX/VISTARIL) 50 MG tablet Take 1 tablet (50 mg total) by mouth 3 (three) times daily as needed. 90 tablet 3  . insulin aspart (NOVOLOG) 100 UNIT/ML FlexPen Inject 10 Units into the skin 3 (three) times daily with meals. 30 mL 3  . Insulin Glargine (LANTUS SOLOSTAR) 100 UNIT/ML Solostar Pen Inject 50 Units into the skin daily at 10 pm. 45 mL 3  . Insulin Pen Needle (PX SHORTLENGTH PEN NEEDLES) 31G X 8 MM MISC 1 each by Does not apply route 6 (six) times daily. 180 each 11  . Insulin Syringes, Disposable, U-100 1 ML MISC 1 each by Does not apply route 6 (six) times daily. 180 each 11  . ketotifen (ZADITOR) 0.025 %  ophthalmic solution Place 1 drop into both eyes daily as needed (for dry eyes). 10 mL 2  . ondansetron (ZOFRAN) 8 MG tablet Take 1 tablet (8 mg total) by mouth every 8 (eight) hours as needed for nausea or vomiting. 20 tablet 0  . pregabalin (LYRICA) 100 MG capsule Take 1 capsule (100 mg total) by mouth 3 (three) times daily. (Patient not taking: Reported on 08/21/2016) 270 capsule 3  . ranitidine (ZANTAC) 150 MG tablet Take 1 tablet (150 mg total) by mouth 2 (two) times daily. 60 tablet 3  . sitaGLIPtin (JANUVIA) 50 MG tablet Take 1 tablet (50 mg total) by mouth daily. 90 tablet 3  . valACYclovir (VALTREX) 500 MG tablet TAKE 1 TABLET BY MOUTH DAILY. 30 tablet 0   No facility-administered medications prior to visit.     ROS Review of Systems  Constitutional: Negative for chills and fever.  Eyes: Negative for visual disturbance.  Respiratory: Negative for shortness of breath.   Cardiovascular: Negative for chest pain.  Gastrointestinal: Negative for abdominal pain and blood in stool.  Musculoskeletal: Negative for arthralgias and back pain.  Skin: Negative for rash.  Allergic/Immunologic: Negative for immunocompromised state.  Neurological: Positive for headaches.  Hematological: Negative for adenopathy. Does not bruise/bleed easily.  Psychiatric/Behavioral: Negative for dysphoric mood and suicidal ideas.    Objective:  BP 140/88 (BP Location: Left Arm, Patient Position: Sitting, Cuff Size: Large)   Pulse 64   Temp 98.4 F (36.9 C) (Oral)   Ht _0  (1.727 m)   Wt 296 lb (134.3 kg)   SpO2 98%   BMI 45.01 kg/m   BP/Weight 09/01/2016 08/22/2016 4/66/5993  Systolic BP 570 177 939  Diastolic BP 88 61 82  Wt. (Lbs) 296 284.2 292.2  BMI 45.01 43.21 44.43   Wt Readings from Last 3 Encounters:  09/01/16 296 lb (134.3 kg)  08/22/16 284 lb 3.2 oz (128.9 kg)  07/27/16 292 lb 3.2 oz (132.5 kg)    Physical Exam  Constitutional: She is oriented to person, place, and time. She appears  well-developed and well-nourished. No distress.  HENT:  Head: Normocephalic and atraumatic.  Cardiovascular: Normal rate, regular rhythm, normal heart sounds and intact distal pulses.   Pulmonary/Chest: Effort normal and breath sounds normal.  Musculoskeletal: She exhibits no edema.  Neurological: She is alert and oriented to person, place, and time.  Skin: Skin is warm and dry. No rash noted.  Psychiatric: She has a normal mood and affect.    Lab Results  Component Value Date   HGBA1C 9.2 (H) 08/21/2016   CBG 121  Assessment & Plan:  Meghin was seen today for hospitalization follow-up.  Diagnoses and  all orders for this visit:  Uncontrolled type 2 diabetes mellitus with complication, unspecified long term insulin use status (HCC) -     POCT glucose (manual entry)  CKD (chronic kidney disease) stage 3, GFR 30-59 ml/min -     CBC -     Basic Metabolic Panel  AKI (acute kidney injury) ([place]) -     CBC -     Basic Metabolic Panel  Essential hypertension -     carvedilol (COREG) 12.5 MG tablet; Take 1 tablet (12.5 mg total) by mouth 2 (two) times daily with a meal.  Chronic diastolic CHF (congestive heart failure), NYHA class 2 (HCC) -     Brain natriuretic peptide  Uncomplicated asthma, unspecified asthma severity, unspecified whether persistent -     Fluticasone-Salmeterol (ADVAIR DISKUS) 250-50 MCG/DOSE AEPB; Inhale 1 puff into the lungs 2 (two) times daily.  Diabetic polyneuropathy associated with type 2 diabetes mellitus (HCC) -     gabapentin (NEURONTIN) 100 MG capsule; Take 2 capsules (200 mg total) by mouth 3 (three) times daily.   There are no diagnoses linked to this encounter.  No orders of the defined types were placed in this encounter.   Follow-up: Return in about 2 weeks (around 09/15/2016) for HTN and headache .   Boykin Nearing MD

## 2016-09-01 NOTE — Assessment & Plan Note (Signed)
Hyperkalemia in setting of AKI due to dehydration, NSAID, lisinopril Plan: Repeat K+ today Patient remains off NSAID and lisinopril

## 2016-09-02 LAB — BRAIN NATRIURETIC PEPTIDE: Brain Natriuretic Peptide: 45.8 pg/mL (ref <100)

## 2016-09-03 ENCOUNTER — Observation Stay (HOSPITAL_COMMUNITY)
Admission: EM | Admit: 2016-09-03 | Discharge: 2016-09-05 | Disposition: A | Discharge status: Home / Self Care | Payer: Self-pay | Attending: Internal Medicine | Admitting: Internal Medicine

## 2016-09-03 ENCOUNTER — Encounter (HOSPITAL_COMMUNITY): Payer: Self-pay

## 2016-09-03 ENCOUNTER — Emergency Department (HOSPITAL_COMMUNITY): Payer: Medicaid Other

## 2016-09-03 DIAGNOSIS — E1165 Type 2 diabetes mellitus with hyperglycemia: Secondary | ICD-10-CM | POA: Insufficient documentation

## 2016-09-03 DIAGNOSIS — E1122 Type 2 diabetes mellitus with diabetic chronic kidney disease: Secondary | ICD-10-CM | POA: Insufficient documentation

## 2016-09-03 DIAGNOSIS — Z7951 Long term (current) use of inhaled steroids: Secondary | ICD-10-CM | POA: Insufficient documentation

## 2016-09-03 DIAGNOSIS — Z87891 Personal history of nicotine dependence: Secondary | ICD-10-CM | POA: Insufficient documentation

## 2016-09-03 DIAGNOSIS — Z7982 Long term (current) use of aspirin: Secondary | ICD-10-CM | POA: Insufficient documentation

## 2016-09-03 DIAGNOSIS — D631 Anemia in chronic kidney disease: Secondary | ICD-10-CM | POA: Insufficient documentation

## 2016-09-03 DIAGNOSIS — I160 Hypertensive urgency: Secondary | ICD-10-CM | POA: Diagnosis present

## 2016-09-03 DIAGNOSIS — I5032 Chronic diastolic (congestive) heart failure: Secondary | ICD-10-CM | POA: Insufficient documentation

## 2016-09-03 DIAGNOSIS — Z79899 Other long term (current) drug therapy: Secondary | ICD-10-CM | POA: Insufficient documentation

## 2016-09-03 DIAGNOSIS — E875 Hyperkalemia: Secondary | ICD-10-CM | POA: Insufficient documentation

## 2016-09-03 DIAGNOSIS — E1140 Type 2 diabetes mellitus with diabetic neuropathy, unspecified: Secondary | ICD-10-CM | POA: Insufficient documentation

## 2016-09-03 DIAGNOSIS — Z794 Long term (current) use of insulin: Secondary | ICD-10-CM | POA: Insufficient documentation

## 2016-09-03 DIAGNOSIS — B182 Chronic viral hepatitis C: Secondary | ICD-10-CM | POA: Insufficient documentation

## 2016-09-03 DIAGNOSIS — E118 Type 2 diabetes mellitus with unspecified complications: Secondary | ICD-10-CM

## 2016-09-03 DIAGNOSIS — Z6841 Body Mass Index (BMI) 40.0 and over, adult: Secondary | ICD-10-CM | POA: Insufficient documentation

## 2016-09-03 DIAGNOSIS — Z91013 Allergy to seafood: Secondary | ICD-10-CM | POA: Insufficient documentation

## 2016-09-03 DIAGNOSIS — F329 Major depressive disorder, single episode, unspecified: Secondary | ICD-10-CM | POA: Insufficient documentation

## 2016-09-03 DIAGNOSIS — K219 Gastro-esophageal reflux disease without esophagitis: Secondary | ICD-10-CM | POA: Insufficient documentation

## 2016-09-03 DIAGNOSIS — R079 Chest pain, unspecified: Secondary | ICD-10-CM

## 2016-09-03 DIAGNOSIS — I10 Essential (primary) hypertension: Secondary | ICD-10-CM

## 2016-09-03 DIAGNOSIS — E1100 Type 2 diabetes mellitus with hyperosmolarity without nonketotic hyperglycemic-hyperosmolar coma (NKHHC): Secondary | ICD-10-CM | POA: Diagnosis present

## 2016-09-03 DIAGNOSIS — I130 Hypertensive heart and chronic kidney disease with heart failure and stage 1 through stage 4 chronic kidney disease, or unspecified chronic kidney disease: Principal | ICD-10-CM | POA: Insufficient documentation

## 2016-09-03 DIAGNOSIS — N184 Chronic kidney disease, stage 4 (severe): Secondary | ICD-10-CM | POA: Diagnosis present

## 2016-09-03 DIAGNOSIS — G4733 Obstructive sleep apnea (adult) (pediatric): Secondary | ICD-10-CM | POA: Insufficient documentation

## 2016-09-03 LAB — BASIC METABOLIC PANEL
Anion gap: 9 (ref 5–15)
BUN: 27 mg/dL — ABNORMAL HIGH (ref 6–20)
CO2: 18 mmol/L — ABNORMAL LOW (ref 22–32)
Calcium: 8.8 mg/dL — ABNORMAL LOW (ref 8.9–10.3)
Chloride: 113 mmol/L — ABNORMAL HIGH (ref 101–111)
Creatinine, Ser: 1.82 mg/dL — ABNORMAL HIGH (ref 0.44–1.00)
GFR calc Af Amer: 34 mL/min — ABNORMAL LOW (ref >60)
GFR calc non Af Amer: 30 mL/min — ABNORMAL LOW (ref >60)
Glucose, Bld: 162 mg/dL — ABNORMAL HIGH (ref 65–99)
Potassium: 5.4 mmol/L — ABNORMAL HIGH (ref 3.5–5.1)
Sodium: 140 mmol/L (ref 135–145)

## 2016-09-03 LAB — I-STAT TROPONIN, ED: Troponin i, poc: 0 ng/mL (ref 0.00–0.08)

## 2016-09-03 LAB — CBC
HCT: 30.8 % — ABNORMAL LOW (ref 36.0–46.0)
Hemoglobin: 9.7 g/dL — ABNORMAL LOW (ref 12.0–15.0)
MCH: 27.8 pg (ref 26.0–34.0)
MCHC: 31.5 g/dL (ref 30.0–36.0)
MCV: 88.3 fL (ref 78.0–100.0)
Platelets: 192 10*3/uL (ref 150–400)
RBC: 3.49 MIL/uL — ABNORMAL LOW (ref 3.87–5.11)
RDW: 14.4 % (ref 11.5–15.5)
WBC: 7.6 10*3/uL (ref 4.0–10.5)

## 2016-09-03 MED ORDER — CLONIDINE HCL 0.2 MG PO TABS
0.2000 mg | ORAL_TABLET | Freq: Once | ORAL | Status: AC
  Administered 2016-09-03: 0.2 mg via ORAL
  Filled 2016-09-03: qty 1

## 2016-09-03 MED ORDER — LISINOPRIL 10 MG PO TABS: 10.0000 mg | ORAL_TABLET | Freq: Once | ORAL | Status: DC

## 2016-09-03 MED ORDER — LABETALOL HCL 5 MG/ML IV SOLN
20.0000 mg | Freq: Once | INTRAVENOUS | Status: AC
  Administered 2016-09-03: 20 mg via INTRAVENOUS
  Filled 2016-09-03: qty 4

## 2016-09-03 NOTE — ED Triage Notes (Signed)
Pt reports sharp chest pain and SOB that started yesterday but worsened today. Denies n/v. She states "an achy" pain radiates down her left arm.

## 2016-09-03 NOTE — ED Provider Notes (Signed)
[facility] DEPT Provider Note   CSN: [id number] Arrival date & time: 09/03/16  1905     History   Chief Complaint Chief Complaint  Patient presents with  . Chest Pain    HPI [name] is a 61 y.o. female.  Patient reports left lateral chest discomfort that feels like pressure with associated exertional shortness of breath.  She reports exertion seems to worsen or chest pain.  This began yesterday but seemed worse today.  She was recently hospitalized for dehydration and renal insufficiency at that time her Lasix and lisinopril were stopped and her Coreg was decreased.  She states initially she felt better but now has been feeling worse over the past 24 hours.  No prior history of coronary artery disease.  She does have a history of hypertension and diabetes.  She has a history of congestive heart failure.  Denies orthopnea.  No unilateral leg swelling.  Reports new mild increasing trace pedal edema.        Past Medical History:  Diagnosis Date  . Asthma    As a child   . Bronchitis   . CHF (congestive heart failure) ([place]) 2011  . Complication of anesthesia   . Diabetes mellitus Dx 1989  . Hepatitis C Dx 2013  . Hypertension Dx 1989  . Obesity   . Pancreatitis 2013  . Refusal of blood transfusions as patient is Jehovah's Witness   . Ulcer ([place]) 2010    Patient Active Problem List   Diagnosis Date Noted  . Hyperkalemia 08/21/2016  . Acute left ankle pain 07/27/2016  . Injury of left hand 02/24/2016  . Asthma 11/29/2015  . Trigger middle finger 11/25/2015  . Depression 09/05/2015  . GERD (gastroesophageal reflux disease) 03/25/2015  . Environmental allergies 03/14/2015  . Hep C w/o coma, chronic ([place]) 01/31/2015  . Diabetic neuropathy ([place]) 01/31/2015  . OSA (obstructive sleep apnea) 01/01/2015  . Poor dentition 11/13/2014  . Essential hypertension 10/08/2014  . Obesity 10/08/2014  . CKD (chronic kidney disease) stage 3, GFR 30-59 ml/min 10/08/2014  .  Diabetes mellitus type 2, uncontrolled, with complications ([place]) 01/77/9390    Class: Chronic  . Chronic diastolic CHF (congestive heart failure), NYHA class 2 ([place]) 03/25/2012  . Anemia in stage 3 chronic kidney disease 03/25/2012    Past Surgical History:  Procedure Laterality Date  . CHOLECYSTECTOMY    . EYE SURGERY    . KNEE ARTHROSCOPY    . TUBAL LIGATION    . TUBAL LIGATION      OB History    No data available       Home Medications    Prior to Admission medications   Medication Sig Start Date End Date Taking? Authorizing Provider  acetaminophen-codeine (TYLENOL #3) 300-30 MG tablet Take 1 tablet by mouth every 8 (eight) hours as needed. Patient taking differently: Take 1 tablet by mouth every 8 (eight) hours as needed for moderate pain.  07/27/16  Yes Josalyn Funches, MD  albuterol (PROVENTIL) (2.5 MG/3ML) 0.083% nebulizer solution Take 3 mLs (2.5 mg total) by nebulization every 6 (six) hours as needed for wheezing or shortness of breath. 03/25/15  Yes Arnoldo Morale, MD  albuterol (VENTOLIN HFA) 108 (90 Base) MCG/ACT inhaler Inhale 2 puffs into the lungs every 4 (four) hours as needed for wheezing or shortness of breath. 08/03/16  Yes Josalyn Funches, MD  aspirin 325 MG tablet Take 1 tablet (325 mg total) by mouth daily. 06/27/15  Yes Boykin Nearing, MD  atorvastatin (  LIPITOR) 20 MG tablet TAKE 1 TABLET BY MOUTH DAILY 06/12/16  Yes Josalyn Funches, MD  carvedilol (COREG) 12.5 MG tablet Take 1 tablet (12.5 mg total) by mouth 2 (two) times daily with a meal. 09/01/16  Yes Josalyn Funches, MD  diclofenac sodium (VOLTAREN) 1 % GEL Apply 4 g topically daily as needed (for pain). 11/25/15  Yes Josalyn Funches, MD  exenatide (BYETTA 5 MCG PEN) 5 MCG/0.02ML SOPN injection INJECT 5 MCG INTO THE SKIN 2 TIMES DAILY WITH A MEAL. 08/03/16  Yes Josalyn Funches, MD  ferrous sulfate 325 (65 FE) MG tablet Take 1 tablet (325 mg total) by mouth 2 (two) times daily with a meal. 07/28/16  Yes Josalyn  Funches, MD  fluticasone (FLONASE) 50 MCG/ACT nasal spray Place 2 sprays into both nostrils daily. Patient taking differently: Place 2 sprays into both nostrils daily as needed for allergies.  10/01/15  Yes Josalyn Funches, MD  gabapentin (NEURONTIN) 100 MG capsule Take 2 capsules (200 mg total) by mouth 3 (three) times daily. 09/01/16  Yes Josalyn Funches, MD  hydrALAZINE (APRESOLINE) 25 MG tablet TAKE 1 TABLET BY MOUTH 3 TIMES DAILY. 06/12/16  Yes Josalyn Funches, MD  hydrOXYzine (ATARAX/VISTARIL) 50 MG tablet Take 1 tablet (50 mg total) by mouth 3 (three) times daily as needed. 07/27/16  Yes Josalyn Funches, MD  insulin aspart (NOVOLOG) 100 UNIT/ML FlexPen Inject 10 Units into the skin 3 (three) times daily with meals. 08/03/16  Yes Josalyn Funches, MD  Insulin Glargine (LANTUS SOLOSTAR) 100 UNIT/ML Solostar Pen Inject 50 Units into the skin daily at 10 pm. Patient taking differently: Inject 50 Units into the skin every morning.  08/03/16  Yes Josalyn Funches, MD  ketotifen (ZADITOR) 0.025 % ophthalmic solution Place 1 drop into both eyes daily as needed (for dry eyes). 07/27/16  Yes Josalyn Funches, MD  ranitidine (ZANTAC) 150 MG tablet Take 1 tablet (150 mg total) by mouth 2 (two) times daily. 07/27/16  Yes Josalyn Funches, MD  sitaGLIPtin (JANUVIA) 50 MG tablet Take 1 tablet (50 mg total) by mouth daily. 08/03/16  Yes Josalyn Funches, MD  valACYclovir (VALTREX) 500 MG tablet TAKE 1 TABLET BY MOUTH DAILY. 08/17/16  Yes Josalyn Funches, MD  Fluticasone-Salmeterol (ADVAIR DISKUS) 250-50 MCG/DOSE AEPB Inhale 1 puff into the lungs 2 (two) times daily. Patient not taking: Reported on 09/03/2016 09/01/16   Boykin Nearing, MD    Family History Family History  Problem Relation Age of Onset  . Colon cancer Mother   . Heart attack Other   . Heart attack Maternal Grandmother   . Hypertension Sister   . Hypertension Brother   . Diabetes Paternal Grandmother     Social History Social History  Substance Use  Topics  . Smoking status: Former Smoker    Quit date: 10/25/1980  . Smokeless tobacco: Never Used     Comment: quit smoking in 1982  . Alcohol use Yes     Comment: 3 times in last year     Allergies   Shellfish allergy; Diazepam; and Morphine and related   Review of Systems Review of Systems  All other systems reviewed and are negative.    Physical Exam Updated Vital Signs BP (!) 208/84   Pulse 66   Temp 97.6 F (36.4 C) (Oral)   Resp 14   SpO2 100%   Physical Exam  Constitutional: She is oriented to person, place, and time. She appears well-developed and well-nourished. No distress.  HENT:  Head: Normocephalic and atraumatic.  Eyes:  EOM are normal.  Neck: Normal range of motion.  Cardiovascular: Normal rate, regular rhythm and normal heart sounds.   Pulmonary/Chest: Effort normal and breath sounds normal.  Abdominal: Soft. She exhibits no distension. There is no tenderness.  Musculoskeletal: Normal range of motion. She exhibits edema.  Neurological: She is alert and oriented to person, place, and time.  Skin: Skin is warm and dry.  Psychiatric: She has a normal mood and affect. Judgment normal.  Nursing note and vitals reviewed.    ED Treatments / Results  Labs (all labs ordered are listed, but only abnormal results are displayed) Labs Reviewed  BASIC METABOLIC PANEL - Abnormal; Notable for the following:       Result Value   Potassium 5.4 (*)    Chloride 113 (*)    CO2 18 (*)    Glucose, Bld 162 (*)    BUN 27 (*)    Creatinine, Ser 1.82 (*)    Calcium 8.8 (*)    GFR calc non Af Amer 30 (*)    GFR calc Af Amer 34 (*)    All other components within normal limits  CBC - Abnormal; Notable for the following:    RBC 3.49 (*)    Hemoglobin 9.7 (*)    HCT 30.8 (*)    All other components within normal limits  I-STAT TROPOININ, ED    BUN  Date Value Ref Range Status  09/03/2016 27 (H) 6 - 20 mg/dL Final  09/01/2016 27 (H) 7 - 25 mg/dL Final    08/22/2016 84 (H) 6 - 20 mg/dL Final  08/21/2016 91 (H) 6 - 20 mg/dL Final   Creat  Date Value Ref Range Status  09/01/2016 1.44 (H) 0.50 - 1.05 mg/dL Final    Comment:      For patients > or = 59 years of age: The upper reference limit for Creatinine is approximately 13% higher for people identified as African-American.     07/27/2016 2.02 (H) 0.50 - 1.05 mg/dL Final    Comment:      For patients > or = 59 years of age: The upper reference limit for Creatinine is approximately 13% higher for people identified as African-American.     05/19/2016 1.90 (H) 0.50 - 1.05 mg/dL Final    Comment:      For patients > or = 59 years of age: The upper reference limit for Creatinine is approximately 13% higher for people identified as African-American.     11/25/2015 1.75 (H) 0.50 - 1.05 mg/dL Final   Creatinine, Ser  Date Value Ref Range Status  09/03/2016 1.82 (H) 0.44 - 1.00 mg/dL Final  08/22/2016 2.43 (H) 0.44 - 1.00 mg/dL Final  08/21/2016 2.69 (H) 0.44 - 1.00 mg/dL Final  08/21/2016 2.61 (H) 0.44 - 1.00 mg/dL Final      EKG  EKG Interpretation  Date/Time:  Thursday September 03 2016 19:14:41 EST Ventricular Rate:  80 PR Interval:  150 QRS Duration: 84 QT Interval:  414 QTC Calculation: 477 R Axis:   48 Text Interpretation:  Normal sinus rhythm Normal ECG No significant change was found Confirmed by Johnrobert Foti  MD, Lennette Bihari ([id number]) on 09/03/2016 11:06:18 PM       Radiology Dg Chest 2 View  Result Date: 09/03/2016 CLINICAL DATA:  Pt reports sharp chest pain and SOB that started yesterday but worsened today. Denies n/v. She states "an achy" pain radiates down her left arm EXAM: CHEST - 2 VIEW COMPARISON:  03/19/2015 FINDINGS: Lungs are  clear. Heart size and mediastinal contours are within normal limits. No effusion. Visualized bones unremarkable. IMPRESSION: No acute cardiopulmonary disease. Electronically Signed   By: Lucrezia Europe M.D.   On: 09/03/2016 19:37     Procedures Procedures (including critical care time)  Medications Ordered in ED Medications  cloNIDine (CATAPRES) tablet 0.2 mg (not administered)  labetalol (NORMODYNE,TRANDATE) injection 20 mg (not administered)     Initial Impression / Assessment and Plan / ED Course  I have reviewed the triage vital signs and the nursing notes.  Pertinent labs & imaging results that were available during my care of the patient were reviewed by me and considered in my medical decision making (see chart for details).     Concerning for hypertensive urgency with blood pressures of 213/80.  Recent changes to her medications including her Lasix and her lisinopril as well as her Coreg by hospitalist service.  I feel like the patient would benefit from repeat observational placement in the hospital for blood pressure management which likely will improve the patient's exertional chest pain shortness of breath.  Low suspicion for pulmonary embolism at this time.  EKG without ischemic changes.  Troponin negative.  IVP medication out of her blood pressure.  Final Clinical Impressions(s) / ED Diagnoses   Final diagnoses:  Chest pain, unspecified type  Hypertensive urgency    New Prescriptions New Prescriptions   No medications on file     Jola Schmidt, MD 09/03/16 2307

## 2016-09-04 ENCOUNTER — Observation Stay (HOSPITAL_BASED_OUTPATIENT_CLINIC_OR_DEPARTMENT_OTHER): Payer: Medicaid Other

## 2016-09-04 DIAGNOSIS — R079 Chest pain, unspecified: Secondary | ICD-10-CM

## 2016-09-04 DIAGNOSIS — E1165 Type 2 diabetes mellitus with hyperglycemia: Secondary | ICD-10-CM

## 2016-09-04 DIAGNOSIS — N183 Chronic kidney disease, stage 3 (moderate): Secondary | ICD-10-CM

## 2016-09-04 DIAGNOSIS — E118 Type 2 diabetes mellitus with unspecified complications: Secondary | ICD-10-CM

## 2016-09-04 DIAGNOSIS — I160 Hypertensive urgency: Secondary | ICD-10-CM | POA: Diagnosis present

## 2016-09-04 DIAGNOSIS — E875 Hyperkalemia: Secondary | ICD-10-CM

## 2016-09-04 DIAGNOSIS — Z794 Long term (current) use of insulin: Secondary | ICD-10-CM

## 2016-09-04 DIAGNOSIS — N2589 Other disorders resulting from impaired renal tubular function: Secondary | ICD-10-CM

## 2016-09-04 LAB — GLUCOSE, CAPILLARY
Glucose-Capillary: 137 mg/dL — ABNORMAL HIGH (ref 65–99)
Glucose-Capillary: 125 mg/dL — ABNORMAL HIGH (ref 65–99)
Glucose-Capillary: 145 mg/dL — ABNORMAL HIGH (ref 65–99)
Glucose-Capillary: 161 mg/dL — ABNORMAL HIGH (ref 65–99)

## 2016-09-04 LAB — ECHOCARDIOGRAM COMPLETE
E decel time: 264 msec
E/e' ratio: 12.53
FS: 31 % (ref 28–44)
Height: 68 in
IVS/LV PW RATIO, ED: 0.63
LA ID, A-P, ES: 39 mm
LA diam end sys: 39 mm
LA diam index: 1.62 cm/m2
LA vol A4C: 87.8 ml
LA vol index: 28.7 mL/m2
LA vol: 69.1 mL
LV E/e' medial: 12.53
LV E/e'average: 12.53
LV PW d: 14.3 mm — AB (ref 0.6–1.1)
LV e' LATERAL: 9.34 cm/s
LVOT area: 2.54 cm2
LVOT diameter: 18 mm
MV Dec: 264
MV Peak grad: 5 mmHg
MV pk A vel: 112 m/s
MV pk E vel: 117 m/s
TDI e' lateral: 9.34
TDI e' medial: 6.64
Weight: 4723.2 oz

## 2016-09-04 LAB — CBC
HCT: 26.3 % — ABNORMAL LOW (ref 36.0–46.0)
Hemoglobin: 8.3 g/dL — ABNORMAL LOW (ref 12.0–15.0)
MCH: 28 pg (ref 26.0–34.0)
MCHC: 31.6 g/dL (ref 30.0–36.0)
MCV: 88.9 fL (ref 78.0–100.0)
Platelets: 188 10*3/uL (ref 150–400)
RBC: 2.96 MIL/uL — ABNORMAL LOW (ref 3.87–5.11)
RDW: 14.4 % (ref 11.5–15.5)
WBC: 6 10*3/uL (ref 4.0–10.5)

## 2016-09-04 LAB — TROPONIN I
Troponin I: <0.03 ng/mL (ref <0.03)
Troponin I: <0.03 ng/mL (ref <0.03)
Troponin I: <0.03 ng/mL (ref <0.03)

## 2016-09-04 LAB — BASIC METABOLIC PANEL
Anion gap: 9 (ref 5–15)
BUN: 27 mg/dL — ABNORMAL HIGH (ref 6–20)
CO2: 18 mmol/L — ABNORMAL LOW (ref 22–32)
Calcium: 8.5 mg/dL — ABNORMAL LOW (ref 8.9–10.3)
Chloride: 111 mmol/L (ref 101–111)
Creatinine, Ser: 1.55 mg/dL — ABNORMAL HIGH (ref 0.44–1.00)
GFR calc Af Amer: 42 mL/min — ABNORMAL LOW (ref >60)
GFR calc non Af Amer: 36 mL/min — ABNORMAL LOW (ref >60)
Glucose, Bld: 136 mg/dL — ABNORMAL HIGH (ref 65–99)
Potassium: 4.5 mmol/L (ref 3.5–5.1)
Sodium: 138 mmol/L (ref 135–145)

## 2016-09-04 LAB — FOLATE: Folate: 12.7 ng/mL (ref >5.9)

## 2016-09-04 LAB — D-DIMER, QUANTITATIVE (NOT AT ARMC): D-Dimer, Quant: <0.27 ug/mL-FEU (ref 0.00–0.50)

## 2016-09-04 LAB — VITAMIN B12: Vitamin B-12: 299 pg/mL (ref 180–914)

## 2016-09-04 LAB — IRON AND TIBC
Iron: 49 ug/dL (ref 28–170)
Saturation Ratios: 15 % (ref 10.4–31.8)
TIBC: 333 ug/dL (ref 250–450)
UIBC: 284 ug/dL

## 2016-09-04 LAB — RETICULOCYTES
RBC.: 2.96 MIL/uL — ABNORMAL LOW (ref 3.87–5.11)
Retic Count, Absolute: 47.4 10*3/uL (ref 19.0–186.0)
Retic Ct Pct: 1.6 % (ref 0.4–3.1)

## 2016-09-04 LAB — HIV ANTIBODY (ROUTINE TESTING W REFLEX): HIV Screen 4th Generation wRfx: NONREACTIVE

## 2016-09-04 LAB — FERRITIN: Ferritin: 114 ng/mL (ref 11–307)

## 2016-09-04 MED ORDER — CARVEDILOL 25 MG PO TABS
25.0000 mg | ORAL_TABLET | Freq: Two times a day (BID) | ORAL | Status: DC
  Administered 2016-09-04 – 2016-09-05 (×3): 25 mg via ORAL
  Filled 2016-09-04 (×3): qty 1

## 2016-09-04 MED ORDER — GABAPENTIN 100 MG PO CAPS
200.0000 mg | ORAL_CAPSULE | Freq: Three times a day (TID) | ORAL | Status: DC
  Administered 2016-09-04 – 2016-09-05 (×4): 200 mg via ORAL
  Filled 2016-09-04 (×4): qty 2

## 2016-09-04 MED ORDER — ALBUTEROL SULFATE HFA 108 (90 BASE) MCG/ACT IN AERS: 2.0000 | INHALATION_SPRAY | RESPIRATORY_TRACT | Status: DC | PRN

## 2016-09-04 MED ORDER — HYDROXYZINE HCL 25 MG PO TABS
50.0000 mg | ORAL_TABLET | Freq: Three times a day (TID) | ORAL | Status: DC | PRN
  Administered 2016-09-04: 50 mg via ORAL
  Filled 2016-09-04: qty 2

## 2016-09-04 MED ORDER — PNEUMOCOCCAL VAC POLYVALENT 25 MCG/0.5ML IJ INJ: 0.5000 mL | INJECTION | INTRAMUSCULAR | Status: DC

## 2016-09-04 MED ORDER — FLUTICASONE FUROATE-VILANTEROL 200-25 MCG/INH IN AEPB
1.0000 | INHALATION_SPRAY | Freq: Every day | RESPIRATORY_TRACT | Status: DC
  Administered 2016-09-04 – 2016-09-05 (×2): 1 via RESPIRATORY_TRACT
  Filled 2016-09-04: qty 28

## 2016-09-04 MED ORDER — ACETAMINOPHEN-CODEINE #3 300-30 MG PO TABS
1.0000 | ORAL_TABLET | Freq: Three times a day (TID) | ORAL | Status: DC | PRN
  Administered 2016-09-05: 1 via ORAL
  Filled 2016-09-04: qty 1

## 2016-09-04 MED ORDER — ASPIRIN EC 81 MG PO TBEC
81.0000 mg | DELAYED_RELEASE_TABLET | Freq: Every day | ORAL | Status: DC
  Administered 2016-09-05: 81 mg via ORAL
  Filled 2016-09-04: qty 1

## 2016-09-04 MED ORDER — KETOTIFEN FUMARATE 0.025 % OP SOLN: 1.0000 [drp] | Freq: Every day | OPHTHALMIC | Status: DC | PRN

## 2016-09-04 MED ORDER — ISOSORBIDE MONONITRATE ER 30 MG PO TB24
30.0000 mg | ORAL_TABLET | Freq: Every day | ORAL | Status: DC
  Administered 2016-09-04 – 2016-09-05 (×2): 30 mg via ORAL
  Filled 2016-09-04 (×2): qty 1

## 2016-09-04 MED ORDER — INSULIN ASPART 100 UNIT/ML ~~LOC~~ SOLN
6.0000 [IU] | Freq: Three times a day (TID) | SUBCUTANEOUS | Status: DC
  Administered 2016-09-04 – 2016-09-05 (×5): 6 [IU] via SUBCUTANEOUS

## 2016-09-04 MED ORDER — PERFLUTREN LIPID MICROSPHERE
1.0000 mL | INTRAVENOUS | Status: AC | PRN
  Administered 2016-09-04: 2 mL via INTRAVENOUS
  Filled 2016-09-04: qty 10

## 2016-09-04 MED ORDER — FERROUS SULFATE 325 (65 FE) MG PO TABS
325.0000 mg | ORAL_TABLET | Freq: Two times a day (BID) | ORAL | Status: DC
  Administered 2016-09-04 – 2016-09-05 (×3): 325 mg via ORAL
  Filled 2016-09-04 (×3): qty 1

## 2016-09-04 MED ORDER — VALACYCLOVIR HCL 500 MG PO TABS
500.0000 mg | ORAL_TABLET | Freq: Every day | ORAL | Status: DC
  Administered 2016-09-04 – 2016-09-05 (×2): 500 mg via ORAL
  Filled 2016-09-04 (×2): qty 1

## 2016-09-04 MED ORDER — INSULIN GLARGINE 100 UNIT/ML ~~LOC~~ SOLN
40.0000 [IU] | Freq: Every day | SUBCUTANEOUS | Status: DC
  Administered 2016-09-04 – 2016-09-05 (×2): 40 [IU] via SUBCUTANEOUS
  Filled 2016-09-04 (×2): qty 0.4

## 2016-09-04 MED ORDER — ATORVASTATIN CALCIUM 20 MG PO TABS
20.0000 mg | ORAL_TABLET | Freq: Every day | ORAL | Status: DC
  Administered 2016-09-04 – 2016-09-05 (×2): 20 mg via ORAL
  Filled 2016-09-04 (×2): qty 1

## 2016-09-04 MED ORDER — HYDRALAZINE HCL 50 MG PO TABS
50.0000 mg | ORAL_TABLET | Freq: Three times a day (TID) | ORAL | Status: DC
Start: 2016-09-04 — End: 2016-09-05
  Administered 2016-09-04 – 2016-09-05 (×4): 50 mg via ORAL
  Filled 2016-09-04 (×4): qty 1

## 2016-09-04 MED ORDER — ASPIRIN 325 MG PO TABS
325.0000 mg | ORAL_TABLET | Freq: Every day | ORAL | Status: DC
  Administered 2016-09-04: 325 mg via ORAL
  Filled 2016-09-04: qty 1

## 2016-09-04 MED ORDER — ALBUTEROL SULFATE (2.5 MG/3ML) 0.083% IN NEBU: 2.5000 mg | INHALATION_SOLUTION | Freq: Four times a day (QID) | RESPIRATORY_TRACT | Status: DC | PRN

## 2016-09-04 MED ORDER — FAMOTIDINE 20 MG PO TABS
20.0000 mg | ORAL_TABLET | Freq: Two times a day (BID) | ORAL | Status: DC
  Administered 2016-09-04 – 2016-09-05 (×4): 20 mg via ORAL
  Filled 2016-09-04 (×4): qty 1

## 2016-09-04 MED ORDER — HYDRALAZINE HCL 20 MG/ML IJ SOLN: 10.0000 mg | INTRAMUSCULAR | Status: DC | PRN

## 2016-09-04 MED ORDER — FLUTICASONE PROPIONATE 50 MCG/ACT NA SUSP: 2.0000 | Freq: Every day | NASAL | Status: DC | PRN

## 2016-09-04 MED ORDER — INSULIN ASPART 100 UNIT/ML ~~LOC~~ SOLN
0.0000 [IU] | Freq: Three times a day (TID) | SUBCUTANEOUS | Status: DC
  Administered 2016-09-04 – 2016-09-05 (×5): 3 [IU] via SUBCUTANEOUS

## 2016-09-04 MED ORDER — HYDRALAZINE HCL 25 MG PO TABS: 25.0000 mg | ORAL_TABLET | Freq: Three times a day (TID) | ORAL | Status: DC

## 2016-09-04 NOTE — Progress Notes (Signed)
The following items were retrieved from security, counted, and returned to pt:  Pink Visa debit Red Visa debit card [employer] debit card [facility] debit card Blue/white Mastercard Green Mastercard Another green Mastercard $43.00 in cash  Pt has signed Form HFS-7, Invoice # [id number], and placed in chart.

## 2016-09-04 NOTE — ED Notes (Signed)
Nurse drawing labs. 

## 2016-09-04 NOTE — H&P (Signed)
History and Physical    [name] MVH:[id number] DOB: December 20, 1955 DOA: 09/03/2016   PCP: Minerva Ends, MD Chief Complaint:  Chief Complaint  Patient presents with  . Chest Pain    HPI: [name] is a 61 y.o. female with medical history significant of DM2, HTN, CKD stage 3.  Patient was recently admitted to our service 2 weeks ago with diarrhea, dehydration, and AKI.  Her lasix, colchicine, and lisinopril were stopped and her coreg dose was halved and the patient was discharged.  Thankfully her renal function has recovered and she is now back at baseline with creat of 1.8.  Unfortunately she presents to the ED today with CP: pressure like sensation, worse with exertion, associated SOB, onset yesterday worse today.  Also had epistaxis yesterday as well.  ED Course: Trop neg, EKG unchanged, but BP is very elevated, running 841L systolic!  Patient given labetalol and clonadine, CP has resolved.  Review of Systems: As per HPI otherwise 10 point review of systems negative.    Past Medical History:  Diagnosis Date  . Asthma    As a child   . Bronchitis   . CHF (congestive heart failure) ([place]) 2011  . Complication of anesthesia   . Diabetes mellitus Dx 1989  . Hepatitis C Dx 2013  . Hypertension Dx 1989  . Obesity   . Pancreatitis 2013  . Refusal of blood transfusions as patient is Jehovah's Witness   . Ulcer ([place]) 2010    Past Surgical History:  Procedure Laterality Date  . CHOLECYSTECTOMY    . EYE SURGERY    . KNEE ARTHROSCOPY    . TUBAL LIGATION    . TUBAL LIGATION       reports that she quit smoking about 35 years ago. She has never used smokeless tobacco. She reports that she drinks alcohol. She reports that she does not use drugs.  Allergies  Allergen Reactions  . Shellfish Allergy Anaphylaxis and Swelling  . Diazepam Other (See Comments)    "felt like out of body experience"  . Morphine And Related Itching    Family History  Problem Relation  Age of Onset  . Colon cancer Mother   . Heart attack Other   . Heart attack Maternal Grandmother   . Hypertension Sister   . Hypertension Brother   . Diabetes Paternal Grandmother       Prior to Admission medications   Medication Sig Start Date End Date Taking? Authorizing Provider  acetaminophen-codeine (TYLENOL #3) 300-30 MG tablet Take 1 tablet by mouth every 8 (eight) hours as needed. Patient taking differently: Take 1 tablet by mouth every 8 (eight) hours as needed for moderate pain.  07/27/16  Yes Josalyn Funches, MD  albuterol (PROVENTIL) (2.5 MG/3ML) 0.083% nebulizer solution Take 3 mLs (2.5 mg total) by nebulization every 6 (six) hours as needed for wheezing or shortness of breath. 03/25/15  Yes Arnoldo Morale, MD  albuterol (VENTOLIN HFA) 108 (90 Base) MCG/ACT inhaler Inhale 2 puffs into the lungs every 4 (four) hours as needed for wheezing or shortness of breath. 08/03/16  Yes Josalyn Funches, MD  aspirin 325 MG tablet Take 1 tablet (325 mg total) by mouth daily. 06/27/15  Yes Josalyn Funches, MD  atorvastatin (LIPITOR) 20 MG tablet TAKE 1 TABLET BY MOUTH DAILY 06/12/16  Yes Josalyn Funches, MD  carvedilol (COREG) 12.5 MG tablet Take 1 tablet (12.5 mg total) by mouth 2 (two) times daily with a meal. 09/01/16  Yes Josalyn Funches,  MD  diclofenac sodium (VOLTAREN) 1 % GEL Apply 4 g topically daily as needed (for pain). 11/25/15  Yes Josalyn Funches, MD  exenatide (BYETTA 5 MCG PEN) 5 MCG/0.02ML SOPN injection INJECT 5 MCG INTO THE SKIN 2 TIMES DAILY WITH A MEAL. 08/03/16  Yes Josalyn Funches, MD  ferrous sulfate 325 (65 FE) MG tablet Take 1 tablet (325 mg total) by mouth 2 (two) times daily with a meal. 07/28/16  Yes Josalyn Funches, MD  fluticasone (FLONASE) 50 MCG/ACT nasal spray Place 2 sprays into both nostrils daily. Patient taking differently: Place 2 sprays into both nostrils daily as needed for allergies.  10/01/15  Yes Josalyn Funches, MD  gabapentin (NEURONTIN) 100 MG capsule Take 2  capsules (200 mg total) by mouth 3 (three) times daily. 09/01/16  Yes Josalyn Funches, MD  hydrALAZINE (APRESOLINE) 25 MG tablet TAKE 1 TABLET BY MOUTH 3 TIMES DAILY. 06/12/16  Yes Josalyn Funches, MD  hydrOXYzine (ATARAX/VISTARIL) 50 MG tablet Take 1 tablet (50 mg total) by mouth 3 (three) times daily as needed. 07/27/16  Yes Josalyn Funches, MD  insulin aspart (NOVOLOG) 100 UNIT/ML FlexPen Inject 10 Units into the skin 3 (three) times daily with meals. 08/03/16  Yes Josalyn Funches, MD  Insulin Glargine (LANTUS SOLOSTAR) 100 UNIT/ML Solostar Pen Inject 50 Units into the skin daily at 10 pm. Patient taking differently: Inject 50 Units into the skin every morning.  08/03/16  Yes Josalyn Funches, MD  ketotifen (ZADITOR) 0.025 % ophthalmic solution Place 1 drop into both eyes daily as needed (for dry eyes). 07/27/16  Yes Josalyn Funches, MD  ranitidine (ZANTAC) 150 MG tablet Take 1 tablet (150 mg total) by mouth 2 (two) times daily. 07/27/16  Yes Josalyn Funches, MD  sitaGLIPtin (JANUVIA) 50 MG tablet Take 1 tablet (50 mg total) by mouth daily. 08/03/16  Yes Josalyn Funches, MD  valACYclovir (VALTREX) 500 MG tablet TAKE 1 TABLET BY MOUTH DAILY. 08/17/16  Yes Josalyn Funches, MD  Fluticasone-Salmeterol (ADVAIR DISKUS) 250-50 MCG/DOSE AEPB Inhale 1 puff into the lungs 2 (two) times daily. Patient not taking: Reported on 09/03/2016 09/01/16   Boykin Nearing, MD    Physical Exam: Vitals:   09/03/16 2215 09/03/16 2230 09/04/16 0100 09/04/16 0115  BP: (!) 204/79 (!) 208/84 177/78 168/70  Pulse: 69 66 65 70  Resp: 13 14 15 14   Temp:      TempSrc:      SpO2: 100% 100% 98% 98%      Constitutional: NAD, calm, comfortable Eyes: PERRL, lids and conjunctivae normal ENMT: Mucous membranes are moist. Posterior pharynx clear of any exudate or lesions.Normal dentition.  Neck: normal, supple, no masses, no thyromegaly Respiratory: clear to auscultation bilaterally, no wheezing, no crackles. Normal respiratory  effort. No accessory muscle use.  Cardiovascular: Regular rate and rhythm, no murmurs / rubs / gallops. No extremity edema. 2+ pedal pulses. No carotid bruits.  Abdomen: no tenderness, no masses palpated. No hepatosplenomegaly. Bowel sounds positive.  Musculoskeletal: no clubbing / cyanosis. No joint deformity upper and lower extremities. Good ROM, no contractures. Normal muscle tone.  Skin: no rashes, lesions, ulcers. No induration Neurologic: CN 2-12 grossly intact. Sensation intact, DTR normal. Strength 5/5 in all 4.  Psychiatric: Normal judgment and insight. Alert and oriented x 3. Normal mood.    Labs on Admission: I have personally reviewed following labs and imaging studies  CBC:  Recent Labs Lab 09/01/16 1151 09/03/16 1924  WBC 6.2 7.6  HGB 9.8* 9.7*  HCT 30.0* 30.8*  MCV  86.7 88.3  PLT 196 601   Basic Metabolic Panel:  Recent Labs Lab 09/01/16 1151 09/03/16 1924  NA 140 140  K 5.1 5.4*  CL 111* 113*  CO2 20 18*  GLUCOSE 98 162*  BUN 27* 27*  CREATININE 1.44* 1.82*  CALCIUM 8.8 8.8*   GFR: Estimated Creatinine Clearance: 49 mL/min (by C-G formula based on SCr of 1.82 mg/dL (H)). Liver Function Tests: No results for input(s): AST, ALT, ALKPHOS, BILITOT, PROT, ALBUMIN in the last 168 hours. No results for input(s): LIPASE, AMYLASE in the last 168 hours. No results for input(s): AMMONIA in the last 168 hours. Coagulation Profile: No results for input(s): INR, PROTIME in the last 168 hours. Cardiac Enzymes: No results for input(s): CKTOTAL, CKMB, CKMBINDEX, TROPONINI in the last 168 hours. BNP (last 3 results) No results for input(s): PROBNP in the last 8760 hours. HbA1C: No results for input(s): HGBA1C in the last 72 hours. CBG: No results for input(s): GLUCAP in the last 168 hours. Lipid Profile: No results for input(s): CHOL, HDL, LDLCALC, TRIG, CHOLHDL, LDLDIRECT in the last 72 hours. Thyroid Function Tests: No results for input(s): TSH, T4TOTAL,  FREET4, T3FREE, THYROIDAB in the last 72 hours. Anemia Panel: No results for input(s): VITAMINB12, FOLATE, FERRITIN, TIBC, IRON, RETICCTPCT in the last 72 hours. Urine analysis:    Component Value Date/Time   COLORURINE YELLOW 08/22/2016 [place] 08/22/2016 0557   LABSPEC 1.010 08/22/2016 0557   PHURINE 5.0 08/22/2016 0557   GLUCOSEU NEGATIVE 08/22/2016 0557   HGBUR NEGATIVE 08/22/2016 0557   BILIRUBINUR NEGATIVE 08/22/2016 0557   BILIRUBINUR neg 04/29/2015 1029   KETONESUR NEGATIVE 08/22/2016 0557   PROTEINUR 30 (A) 08/22/2016 0557   UROBILINOGEN 0.2 04/29/2015 1029   UROBILINOGEN 0.2 05/29/2010 2100   NITRITE NEGATIVE 08/22/2016 0557   LEUKOCYTESUR NEGATIVE 08/22/2016 0557   Sepsis Labs: @LABRCNTIP (procalcitonin:4,lacticidven:4) )No results found for this or any previous visit (from the past 240 hour(s)).   Radiological Exams on Admission: Dg Chest 2 View  Result Date: 09/03/2016 CLINICAL DATA:  Pt reports sharp chest pain and SOB that started yesterday but worsened today. Denies n/v. She states "an achy" pain radiates down her left arm EXAM: CHEST - 2 VIEW COMPARISON:  03/19/2015 FINDINGS: Lungs are clear. Heart size and mediastinal contours are within normal limits. No effusion. Visualized bones unremarkable. IMPRESSION: No acute cardiopulmonary disease. Electronically Signed   By: Lucrezia Europe M.D.   On: 09/03/2016 19:37    EKG: Independently reviewed.  Assessment/Plan Active Problems:   Hypertensive urgency    1. HTN urgency - 1. Increase home hydralazine to 50mg  TID 2. Increase home coreg to 25mg  BID 3. Due to K of 5.4, I dont want to restart ACEi at this point 2. DM2 - 1. Holding byetta and jardiance 2. Reduce lantus to 40 units QD while inpatient 3. Reduce mealtime to 6 AC while inpatient 4. Add resistant scale SSI AC 3. CKD stage 3 - creat of 1.8 today seems to be baseline when compared to a year ago. 1. She is pretty close to stage 4 CKD and  had AKI earlier this month and due to mild hyper K cant start ACEi... Actually it almost looks like she has a type 4 RTA... 1. Will go ahead and check aldosterone 2. Consider nephrology referral?   DVT prophylaxis: SCDs due to epistaxis yesterday Code Status: Full Family Communication: No family in room Consults called: None Admission status: Admit to obs   GARDNER, JARED  Jerilynn Mages DO [facility] Pager [phone] from 7PM-7AM  If 7AM-7PM, please contact the day physician for the patient [email or web address] Password [facility]  09/04/2016, 1:37 AM

## 2016-09-04 NOTE — Progress Notes (Signed)
  Echocardiogram 2D Echocardiogram with Definity has been performed.  [name] 09/04/2016, 12:34 PM

## 2016-09-04 NOTE — Consult Note (Signed)
CARDIOLOGY CONSULT NOTE   Patient ID: [name] MRN: [id number] DOB/AGE: Aug 19, 1955 61 y.o.  Admit date: 09/03/2016  Primary Physician   Minerva Ends, MD Primary Cardiologist   Dr. Burt Knack Reason for Consultation   Chest pain  Requesting Physician  Dr. Tana Coast  HPI: [name] is a 61 y.o. female with a history of Chronic diastolic CHF, HTN, DM, remote hx tob (quit '82), ETOH/Crack cocaine use (quit '97), and untreated sleep apnea who recently admitted for AKI and dehydration presents for chest pain and found to have hypertensive urgency.   Seen by Dr. Burt Knack during 09/2014 admission for chest pain. Echo/enzymes/d-dimer all negative. Main issue will be BP control due to medication adherence. She also had AKI and stoped lasix as a scheduled drug and advise to use only as needed. Sleep study 01/01/15 showed significant sleep apnea and had successful CPAP titration. She never got CPAP unit due to lack or insurance and money.   She was admitted 2/9-2/10 admitted for acute on chronic CKD in setting of dehydration while on ACE, NSAIDs, lasix and colchicine. She was hyperkalemic. Given fluids. Coreg reduced and stopped ACE & lasix and discharged home.   Since wednesday 2/21patient noted exertional chest pain described as sharp achyness. Associated with SOB and right side shoulder radiation. No rest pain. Yesterday she had worse episode with minimal walking to ER presentation for further evaluation. Her BP was 213/80. EKG showed NSR with TWI in lead III which similar to prior EKGs.(personally reviewed). K of 5.4 with Scr of 1.82. She was given IV labetalol and started increased dose of Coreg and hydralazine. Her BP improved. Chest pain resolved. Troponin x 2 negative. Hgb of 8.3 today. CXR without acute disease.   Pt denies any orthopnea, PND, dizziness, syncope, melena or blood in her stool in her urine.    Past Medical History:  Diagnosis Date  . Asthma    As a child   . Bronchitis   .  CHF (congestive heart failure) ([place]) 2011  . Complication of anesthesia   . Diabetes mellitus Dx 1989  . Hepatitis C Dx 2013  . Hypertension Dx 1989  . Obesity   . Pancreatitis 2013  . Refusal of blood transfusions as patient is Jehovah's Witness   . Ulcer ([place]) 2010     Past Surgical History:  Procedure Laterality Date  . CHOLECYSTECTOMY    . EYE SURGERY    . KNEE ARTHROSCOPY    . TUBAL LIGATION    . TUBAL LIGATION      Allergies  Allergen Reactions  . Shellfish Allergy Anaphylaxis and Swelling  . Diazepam Other (See Comments)    "felt like out of body experience"  . Morphine And Related Itching    I have reviewed the patient's current medications . [START ON 09/05/2016] aspirin EC  81 mg Oral Daily  . atorvastatin  20 mg Oral Daily  . carvedilol  25 mg Oral BID WC  . famotidine  20 mg Oral BID  . ferrous sulfate  325 mg Oral BID WC  . fluticasone furoate-vilanterol  1 puff Inhalation Daily  . gabapentin  200 mg Oral TID  . hydrALAZINE  50 mg Oral TID  . insulin aspart  0-20 Units Subcutaneous TID WC  . insulin aspart  6 Units Subcutaneous TID WC  . insulin glargine  40 Units Subcutaneous Daily  . isosorbide mononitrate  30 mg Oral Daily  . valACYclovir  500 mg Oral Daily  acetaminophen-codeine, albuterol, fluticasone, hydrALAZINE, hydrOXYzine, ketotifen  Prior to Admission medications   Medication Sig Start Date End Date Taking? Authorizing Provider  acetaminophen-codeine (TYLENOL #3) 300-30 MG tablet Take 1 tablet by mouth every 8 (eight) hours as needed. Patient taking differently: Take 1 tablet by mouth every 8 (eight) hours as needed for moderate pain.  07/27/16  Yes Josalyn Funches, MD  albuterol (PROVENTIL) (2.5 MG/3ML) 0.083% nebulizer solution Take 3 mLs (2.5 mg total) by nebulization every 6 (six) hours as needed for wheezing or shortness of breath. 03/25/15  Yes Arnoldo Morale, MD  albuterol (VENTOLIN HFA) 108 (90 Base) MCG/ACT inhaler Inhale 2 puffs into  the lungs every 4 (four) hours as needed for wheezing or shortness of breath. 08/03/16  Yes Josalyn Funches, MD  aspirin 325 MG tablet Take 1 tablet (325 mg total) by mouth daily. 06/27/15  Yes Josalyn Funches, MD  atorvastatin (LIPITOR) 20 MG tablet TAKE 1 TABLET BY MOUTH DAILY 06/12/16  Yes Josalyn Funches, MD  carvedilol (COREG) 12.5 MG tablet Take 1 tablet (12.5 mg total) by mouth 2 (two) times daily with a meal. 09/01/16  Yes Josalyn Funches, MD  diclofenac sodium (VOLTAREN) 1 % GEL Apply 4 g topically daily as needed (for pain). 11/25/15  Yes Josalyn Funches, MD  exenatide (BYETTA 5 MCG PEN) 5 MCG/0.02ML SOPN injection INJECT 5 MCG INTO THE SKIN 2 TIMES DAILY WITH A MEAL. 08/03/16  Yes Josalyn Funches, MD  ferrous sulfate 325 (65 FE) MG tablet Take 1 tablet (325 mg total) by mouth 2 (two) times daily with a meal. 07/28/16  Yes Josalyn Funches, MD  fluticasone (FLONASE) 50 MCG/ACT nasal spray Place 2 sprays into both nostrils daily. Patient taking differently: Place 2 sprays into both nostrils daily as needed for allergies.  10/01/15  Yes Josalyn Funches, MD  gabapentin (NEURONTIN) 100 MG capsule Take 2 capsules (200 mg total) by mouth 3 (three) times daily. 09/01/16  Yes Josalyn Funches, MD  hydrALAZINE (APRESOLINE) 25 MG tablet TAKE 1 TABLET BY MOUTH 3 TIMES DAILY. 06/12/16  Yes Josalyn Funches, MD  hydrOXYzine (ATARAX/VISTARIL) 50 MG tablet Take 1 tablet (50 mg total) by mouth 3 (three) times daily as needed. 07/27/16  Yes Josalyn Funches, MD  insulin aspart (NOVOLOG) 100 UNIT/ML FlexPen Inject 10 Units into the skin 3 (three) times daily with meals. 08/03/16  Yes Josalyn Funches, MD  Insulin Glargine (LANTUS SOLOSTAR) 100 UNIT/ML Solostar Pen Inject 50 Units into the skin daily at 10 pm. Patient taking differently: Inject 50 Units into the skin every morning.  08/03/16  Yes Josalyn Funches, MD  ketotifen (ZADITOR) 0.025 % ophthalmic solution Place 1 drop into both eyes daily as needed (for dry eyes).  07/27/16  Yes Josalyn Funches, MD  ranitidine (ZANTAC) 150 MG tablet Take 1 tablet (150 mg total) by mouth 2 (two) times daily. 07/27/16  Yes Josalyn Funches, MD  sitaGLIPtin (JANUVIA) 50 MG tablet Take 1 tablet (50 mg total) by mouth daily. 08/03/16  Yes Josalyn Funches, MD  valACYclovir (VALTREX) 500 MG tablet TAKE 1 TABLET BY MOUTH DAILY. 08/17/16  Yes Josalyn Funches, MD  Fluticasone-Salmeterol (ADVAIR DISKUS) 250-50 MCG/DOSE AEPB Inhale 1 puff into the lungs 2 (two) times daily. Patient not taking: Reported on 09/03/2016 09/01/16   Boykin Nearing, MD     Social History   Social History  . Marital status: Divorced    Spouse name: N/A  . Number of children: N/A  . Years of education: N/A   Occupational History  .  Not on file.   Social History Main Topics  . Smoking status: Former Smoker    Quit date: 10/25/1980  . Smokeless tobacco: Never Used     Comment: quit smoking in 1982  . Alcohol use Yes     Comment: 3 times in last year  . Drug use: No     Comment: 08/21/2016 "clean since 05/1998"  . Sexual activity: Not on file     Comment: Not asked   Other Topics Concern  . Not on file   Social History Narrative  . No narrative on file    Family Status  Relation Status  . Mother Deceased  . Father Deceased  . Other   . Maternal Grandmother   . Sister   . Brother   . Paternal Grandmother    Family History  Problem Relation Age of Onset  . Colon cancer Mother   . Heart attack Other   . Heart attack Maternal Grandmother   . Hypertension Sister   . Hypertension Brother   . Diabetes Paternal Grandmother     ROS:  Full 14 point review of systems complete and found to be negative unless listed above.  Physical Exam: Blood pressure (!) 183/67, pulse (!) 59, temperature 98 F (36.7 C), temperature source Oral, resp. rate 16, height 5\' 8"  (1.727 m), weight 295 lb 3.2 oz (133.9 kg), SpO2 100 %.  General: Well developed, well nourished, female in no acute distress Head: Eyes  PERRLA, No xanthomas. Normocephalic and atraumatic, oropharynx without edema or exudate.  Lungs: Resp regular and unlabored, CTA. Heart: RRR no s3, s4, or murmurs..   Neck: No carotid bruits. No lymphadenopathy.  No JVD. Abdomen: Bowel sounds present, abdomen soft and non-tender without masses or hernias noted. Msk:  No spine or cva tenderness. No weakness, no joint deformities or effusions. Extremities: No clubbing, cyanosis or edema. DP/PT/Radials 2+ and equal bilaterally. Neuro: Alert and oriented X 3. No focal deficits noted. Psych:  Good affect, responds appropriately Skin: No rashes or lesions noted.  Labs:   Lab Results  Component Value Date   WBC 6.0 09/04/2016   HGB 8.3 (L) 09/04/2016   HCT 26.3 (L) 09/04/2016   MCV 88.9 09/04/2016   PLT 188 09/04/2016   No results for input(s): INR in the last 72 hours.  Recent Labs Lab 09/04/16 0622  NA 138  K 4.5  CL 111  CO2 18*  BUN 27*  CREATININE 1.55*  CALCIUM 8.5*  GLUCOSE 136*   Magnesium  Date Value Ref Range Status  08/22/2016 1.7 1.7 - 2.4 mg/dL Final    Recent Labs  09/04/16 0136 09/04/16 0738  TROPONINI <0.03 <0.03    Recent Labs  09/03/16 1927  TROPIPOC 0.00   Pro B Natriuretic peptide (BNP)  Date/Time Value Ref Range Status  04/08/2015 09:40 AM 143.40 (H) <126 pg/mL Final  03/14/2015 10:59 AM 187.90 (H) <126 pg/mL Final    Comment:      Footnotes:  (1) ** Please note change in unit of measure and reference range(s). **      Lab Results  Component Value Date   CHOL 141 11/25/2015   HDL 45 (L) 11/25/2015   LDLCALC 66 11/25/2015   TRIG 150 (H) 11/25/2015   Lab Results  Component Value Date   DDIMER <0.27 02/16/2015   Lipase  Date/Time Value Ref Range Status  08/21/2016 01:45 PM 41 11 - 51 U/L Final   TSH  Date/Time Value Ref Range Status  03/26/2012 12:25 PM 1.254 0.350 - 4.500 uIU/mL Final   Vitamin B-12  Date/Time Value Ref Range Status  09/04/2016 06:22 AM 299 180 - 914  pg/mL Final    Comment:    (NOTE) This assay is not validated for testing neonatal or myeloproliferative syndrome specimens for Vitamin B12 levels.    Folate  Date/Time Value Ref Range Status  09/04/2016 07:00 AM 12.7 >5.9 ng/mL Final   Ferritin  Date/Time Value Ref Range Status  09/04/2016 06:22 AM 114 11 - 307 ng/mL Final   TIBC  Date/Time Value Ref Range Status  09/04/2016 06:22 AM 333 250 - 450 ug/dL Final   Iron  Date/Time Value Ref Range Status  09/04/2016 06:22 AM 49 28 - 170 ug/dL Final   Retic Ct Pct  Date/Time Value Ref Range Status  09/04/2016 06:22 AM 1.6 0.4 - 3.1 % Final    Echo: 02/16/15 LV EF: 55% -   60%  ------------------------------------------------------------------- Indications:      Dyspnea 786.09.  ------------------------------------------------------------------- History:   Risk factors:  Hypertension. Diabetes mellitus. Obese. Obstructive sleep apnea.  ------------------------------------------------------------------- Study Conclusions  - Left ventricle: The cavity size was normal. Wall thickness was   normal. Systolic function was normal. The estimated ejection   fraction was in the range of 55% to 60%. Wall motion was normal;   there were no regional wall motion abnormalities. Features are   consistent with a pseudonormal left ventricular filling pattern,   with concomitant abnormal relaxation and increased filling   pressure (grade 2 diastolic dysfunction). - Mitral valve: Calcified annulus. - Left atrium: The atrium was mildly dilated. - Right ventricle: The cavity size was mildly dilated. Wall   thickness was normal. - Atrial septum: No defect or patent foramen ovale was identified.   Radiology:  Dg Chest 2 View  Result Date: 09/03/2016 CLINICAL DATA:  Pt reports sharp chest pain and SOB that started yesterday but worsened today. Denies n/v. She states "an achy" pain radiates down her left arm EXAM: CHEST - 2 VIEW  COMPARISON:  03/19/2015 FINDINGS: Lungs are clear. Heart size and mediastinal contours are within normal limits. No effusion. Visualized bones unremarkable. IMPRESSION: No acute cardiopulmonary disease. Electronically Signed   By: Lucrezia Europe M.D.   On: 09/03/2016 19:37    ASSESSMENT AND PLAN:     1. Chest pain - In setting of hypertensive urgency. EKG without acute changes. ? More pronounced TWI in lead III. Troponin x 2 negative. No reoccurrence of while admitted. Ambulate her. If her troponin remains negative and normal EF on echo (pending), will plan for outpatient stress test. This would be two day study.   2. Hypertensive urgency - Likely due to recent medication cut back. BP improving. Continue hydralazine to 50 mg TID, Coreg 25 mg twice a day, and added Imdur 30 mg daily. Lisinopril continued to hold due to hyperkalemia (now resolved) and AKI.  3. Untreated OSA - Unable to afford CPAP  4. Chronic diastolic CHF - Euvolemic. Previously 09/2014 she had AKI in setting of daily lasix. Advised to take PRN. Again admitted recently for dehydration and AKI on daily dose of Lasix. Seem she will need close outpatient monitoring of her kidney function.    SignedLeanor Kail, PA 09/04/2016, 10:44 AM Pager [phone]  Co-Sign MD  I have seen and examined the patient along with Bhagat,Bhavinkumar, [place].  I have reviewed the chart, notes and new data.  I agree with PA's note.  Key new complaints: currently pain free  Key examination changes: severe obesity limits the exam. No overt CV abnormalities on exam Key new findings / data: chronic repol changes on ECG, normal cardiac enzymes.  PLAN: She may have angina related to marked HTN and anemia, but does have coronary risk factors. Unless echo shows low EF or regional wall motion abnormalities, recommend outpatient Lexiscan Myoview (2 day study due to habitus). If echo is abnormal, suggestive of CAD with regional abnormalities, would proceed  directly to cath, after optimization of renal function. Anemia with rather rapid drop in Hgb needs to be evaluated before we would consider PCI. Note low venous CO2 and high K, probably type 4 RTA due to DM.   Sanda Klein, MD, [place] [phone] 09/04/2016, 12:42 PM

## 2016-09-04 NOTE — Progress Notes (Signed)
Patient seen and examined, admitted by Dr. Fabio Neighbors this morning. Briefly 61 year old female withDM2, HTN, CKD stage 3, obesity, recently  Discharged 2 weeks ago with diarrhea, dehydration and AKI. Now presenting with chest pain, shortness of breath, worsening with exertion, since Wednesday intermittent episodes, also having epistaxis episodes since yesterday. BP running in 275T systolic, troponin negative, EKG unchanged. Hyperkalemia with potassium of 5.4  BP (!) 183/67   Pulse (!) 59   Temp 98 F (36.7 C) (Oral)   Resp 16   Ht 5\' 8"  (1.727 m)   Wt 133.9 kg (295 lb 3.2 oz)   SpO2 100%   BMI 44.89 kg/m    A/p Chest pain with dyspnea respect to his diabetes, hypertension, chronic kidney disease stage III - Troponins 2 negative, no acute ST-T wave changes, follow 2-D echo - Obtain d-dimer - Cardiology consulted, may need stress test   Accelerated hypertension - Increased hydralazine to 50 mg TID, Coreg 25 mg twice a day, added Imdur 30 mg daily, continue hydralazine IV - Hold ACE inhibitor due to hyperkalemia - Follow renal aldosterone ratio     [name] M.D. [facility] 09/04/2016, 11:12 AM  Pager: [phone]

## 2016-09-05 DIAGNOSIS — I160 Hypertensive urgency: Secondary | ICD-10-CM

## 2016-09-05 DIAGNOSIS — R072 Precordial pain: Secondary | ICD-10-CM

## 2016-09-05 LAB — CBC
HCT: 28.5 % — ABNORMAL LOW (ref 36.0–46.0)
Hemoglobin: 8.9 g/dL — ABNORMAL LOW (ref 12.0–15.0)
MCH: 27.5 pg (ref 26.0–34.0)
MCHC: 31.2 g/dL (ref 30.0–36.0)
MCV: 88 fL (ref 78.0–100.0)
Platelets: 172 10*3/uL (ref 150–400)
RBC: 3.24 MIL/uL — ABNORMAL LOW (ref 3.87–5.11)
RDW: 14.4 % (ref 11.5–15.5)
WBC: 6.9 10*3/uL (ref 4.0–10.5)

## 2016-09-05 LAB — BASIC METABOLIC PANEL
Anion gap: 7 (ref 5–15)
BUN: 32 mg/dL — ABNORMAL HIGH (ref 6–20)
CO2: 21 mmol/L — ABNORMAL LOW (ref 22–32)
Calcium: 8.4 mg/dL — ABNORMAL LOW (ref 8.9–10.3)
Chloride: 111 mmol/L (ref 101–111)
Creatinine, Ser: 1.61 mg/dL — ABNORMAL HIGH (ref 0.44–1.00)
GFR calc Af Amer: 40 mL/min — ABNORMAL LOW (ref >60)
GFR calc non Af Amer: 34 mL/min — ABNORMAL LOW (ref >60)
Glucose, Bld: 174 mg/dL — ABNORMAL HIGH (ref 65–99)
Potassium: 4.5 mmol/L (ref 3.5–5.1)
Sodium: 139 mmol/L (ref 135–145)

## 2016-09-05 LAB — GLUCOSE, CAPILLARY
Glucose-Capillary: 125 mg/dL — ABNORMAL HIGH (ref 65–99)
Glucose-Capillary: 128 mg/dL — ABNORMAL HIGH (ref 65–99)

## 2016-09-05 MED ORDER — ISOSORBIDE MONONITRATE ER 30 MG PO TB24: 30.0000 mg | ORAL_TABLET | Freq: Every day | ORAL | 0 refills | Status: DC

## 2016-09-05 MED ORDER — HYDRALAZINE HCL 50 MG PO TABS: 50.0000 mg | ORAL_TABLET | Freq: Three times a day (TID) | ORAL | 0 refills | Status: DC

## 2016-09-05 MED ORDER — CARVEDILOL 12.5 MG PO TABS: 25.0000 mg | ORAL_TABLET | Freq: Two times a day (BID) | ORAL | 0 refills | Status: DC

## 2016-09-05 NOTE — Progress Notes (Signed)
Progress Note  Patient Name: [name] Date of Encounter: 09/05/2016  Primary Cardiologist: Dr Burt Knack  Subjective   Symptoms much improved following BP control; denies dyspnea or chest pain  Inpatient Medications    Scheduled Meds: . aspirin EC  81 mg Oral Daily  . atorvastatin  20 mg Oral Daily  . carvedilol  25 mg Oral BID WC  . famotidine  20 mg Oral BID  . ferrous sulfate  325 mg Oral BID WC  . fluticasone furoate-vilanterol  1 puff Inhalation Daily  . gabapentin  200 mg Oral TID  . hydrALAZINE  50 mg Oral TID  . insulin aspart  0-20 Units Subcutaneous TID WC  . insulin aspart  6 Units Subcutaneous TID WC  . insulin glargine  40 Units Subcutaneous Daily  . isosorbide mononitrate  30 mg Oral Daily  . valACYclovir  500 mg Oral Daily   Continuous Infusions:  PRN Meds: acetaminophen-codeine, albuterol, fluticasone, hydrALAZINE, hydrOXYzine, ketotifen   Vital Signs    Vitals:   09/04/16 2300 09/05/16 0517 09/05/16 0844 09/05/16 0953  BP: (!) 145/64 134/60 (!) 148/57   Pulse: 64 66 75   Resp: 18 18 18    Temp: 97.3 F (36.3 C) 98.3 F (36.8 C)    TempSrc: Oral Oral    SpO2: 99% 98% 99% 98%  Weight:  296 lb (134.3 kg)    Height:        Intake/Output Summary (Last 24 hours) at 09/05/16 1207 Last data filed at 09/05/16 0912  Gross per 24 hour  Intake             1190 ml  Output             2290 ml  Net            -1100 ml   Filed Weights   09/04/16 0224 09/05/16 0517  Weight: 295 lb 3.2 oz (133.9 kg) 296 lb (134.3 kg)    Telemetry    Sinus- Personally Reviewed   Physical Exam   GEN: No acute distress. Obese  Neck: No JVD Cardiac: RRR Respiratory: CTA GI: Soft, nontender, non-distended  MS: Trace edema Neuro:  Nonfocal  Psych: Normal affect   Labs    Chemistry Recent Labs Lab 09/03/16 1924 09/04/16 0622 09/05/16 0439  NA 140 138 139  K 5.4* 4.5 4.5  CL 113* 111 111  CO2 18* 18* 21*  GLUCOSE 162* 136* 174*  BUN 27* 27* 32*    CREATININE 1.82* 1.55* 1.61*  CALCIUM 8.8* 8.5* 8.4*  GFRNONAA 30* 36* 34*  GFRAA 34* 42* 40*  ANIONGAP 9 9 7      Hematology Recent Labs Lab 09/03/16 1924 09/04/16 0622 09/05/16 0439  WBC 7.6 6.0 6.9  RBC 3.49* 2.96*  2.96* 3.24*  HGB 9.7* 8.3* 8.9*  HCT 30.8* 26.3* 28.5*  MCV 88.3 88.9 88.0  MCH 27.8 28.0 27.5  MCHC 31.5 31.6 31.2  RDW 14.4 14.4 14.4  PLT 192 188 172    Cardiac Enzymes Recent Labs Lab 09/04/16 0136 09/04/16 0738 09/04/16 1327  TROPONINI <0.03 <0.03 <0.03    Recent Labs Lab 09/03/16 1927  TROPIPOC 0.00     BNP Recent Labs Lab 09/01/16 1151  BNP 45.8     DDimer  Recent Labs Lab 09/04/16 1056  DDIMER <0.27     Radiology    Dg Chest 2 View  Result Date: 09/03/2016 CLINICAL DATA:  Pt reports sharp chest pain and SOB that started yesterday  but worsened today. Denies n/v. She states "an achy" pain radiates down her left arm EXAM: CHEST - 2 VIEW COMPARISON:  03/19/2015 FINDINGS: Lungs are clear. Heart size and mediastinal contours are within normal limits. No effusion. Visualized bones unremarkable. IMPRESSION: No acute cardiopulmonary disease. Electronically Signed   By: Lucrezia Europe M.D.   On: 09/03/2016 19:37    Cardiac Studies   Echocardiogram shows normal LV function, grade 2 diastolic dysfunction and trace aortic insufficiency.  Patient Profile     61 y.o. female admitted with hypertensive urgency and chest pain.  Assessment & Plan    1 hypertensive urgency-blood pressure has improved. Continue carvedilol, hydralazine and nitrates. She will need further management of blood pressure following discharge.  2 chest pain-patient had sharp chest pain on admission which was felt possibly secondary to hypertensive urgency. However also with multiple risk factors. Would arrange outpatient 2 day stress nuclear study.  3 history of chronic diastolic congestive heart failure-echocardiogram shows normal LV systolic function. Grade 2  diastolic dysfunction. Continue Lasix as needed at home.  4 chronic stage 3 kidney disease-she will need follow-up as an outpatient.  5 anemia-possibly secondary to renal insufficiency. Further evaluation per primary care.  6. Obesity-need weight loss.  Patient can be discharged from a cardiac standpoint. Arrange outpatient nuclear study as described above. Follow-up with Dr. Burt Knack.  Signed, Kirk Ruths, MD  09/05/2016, 12:07 PM

## 2016-09-05 NOTE — Progress Notes (Signed)
Pt refused bed alarm on, pt alert and oriented, educated on patient's safety plan. Will continue to do hourly rounding.

## 2016-09-08 LAB — ALDOSTERONE + RENIN ACTIVITY W/ RATIO
ALDO / PRA Ratio: 13.5 (ref 0.0–30.0)
Aldosterone: 4.3 ng/dL (ref 0.0–30.0)
PRA LC/MS/MS: 0.318 ng/mL/hr (ref 0.167–5.380)

## 2016-09-09 ENCOUNTER — Telehealth: Payer: Self-pay

## 2016-09-09 NOTE — Discharge Summary (Signed)
[facility] Discharge Summary   Patient: [name]:[id number]   PCP: Minerva Ends, MD DOB: [birth year]   Date of admission: 09/03/2016   Date of discharge: 09/05/2016    Discharge Diagnoses:  Active Problems:   Diabetes mellitus type 2, uncontrolled, with complications (HCC)   CKD (chronic kidney disease) stage 3, GFR 30-59 ml/min   Hypertensive urgency   Admitted From: home Disposition:  home  Recommendations for Outpatient Follow-up:  1. Please follow up with PCP in 1 week   Follow-up Information    Minerva Ends, MD. Schedule an appointment as soon as possible for a visit in 1 week(s).   Specialty:  Family Medicine Contact information: [address] [place] [address] [phone]          Diet recommendation: cardiac diet  Activity: The patient is advised to gradually reintroduce usual activities.  Discharge Condition: good  Code Status: full code  History of present illness: As per the H and P dictated on admission, "[name] is a 61 y.o. female with medical history significant of DM2, HTN, CKD stage 3.  Patient was recently admitted to our service 2 weeks ago with diarrhea, dehydration, and AKI.  Her lasix, colchicine, and lisinopril were stopped and her coreg dose was halved and the patient was discharged. Thankfully her renal function has recovered and she is now back at baseline with creat of 1.8. Unfortunately she presents to the ED today with CP: pressure like sensation, worse with exertion, associated SOB, onset yesterday worse today.  Also had epistaxis yesterday as well."  Hospital Course:   Summary of her active problems in the hospital is as following. 1. Hypertensive urgency BP better after up titration of medication Continue carvedilol, hydralazine and nitrates. She will need further management of blood pressure following discharge.  2 chest pain - Troponins 3 negative, no acute ST-T wave changes, normal EF on  2-D echo - negative d-dimer - Cardiology consulted, may need stress test outpatient   3 history of chronic diastolic congestive heart failure echocardiogram shows normal LV systolic function. Grade 2 diastolic dysfunction. Continue Lasix as needed at home.  4 chronic stage 3 kidney disease- she will need follow-up as an outpatient.  5 anemia possibly secondary to renal insufficiency. Further evaluation per primary care.  6. Obesity need weight loss.  All other chronic medical condition were stable during the hospitalization.  Patient was ambulatory without any assistance. On the day of the discharge the patient's vitals were stable, and no other acute medical condition were reported by patient. the patient was felt safe to be discharge at home with family.  Procedures and Results:   Echocardiogram  Study Conclusions  - Left ventricle: The cavity size was normal. Systolic function was   normal. The estimated ejection fraction was in the range of 55%   to 60%. Wall motion was normal; there were no regional wall   motion abnormalities. Features are consistent with a pseudonormal   left ventricular filling pattern, with concomitant abnormal   relaxation and increased filling pressure (grade 2 diastolic   dysfunction). Doppler parameters are consistent with high   ventricular filling pressure. - Aortic valve: There was trivial regurgitation. - Pulmonary arteries: Systolic pressure could not be accurately   estimated.  Consultations:  Cardiology  DISCHARGE MEDICATION: Discharge Medication List as of 09/05/2016  2:10 PM    START taking these medications   Details  isosorbide mononitrate (IMDUR) 30 MG 24 hr tablet Take  1 tablet (30 mg total) by mouth daily., Starting Sun 09/06/2016, Normal      CONTINUE these medications which have CHANGED   Details  carvedilol (COREG) 12.5 MG tablet Take 2 tablets (25 mg total) by mouth 2 (two) times daily with a meal., Starting Sat  09/05/2016, Normal    hydrALAZINE (APRESOLINE) 50 MG tablet Take 1 tablet (50 mg total) by mouth 3 (three) times daily., Starting Sat 09/05/2016, Normal      CONTINUE these medications which have NOT CHANGED   Details  acetaminophen-codeine (TYLENOL #3) 300-30 MG tablet Take 1 tablet by mouth every 8 (eight) hours as needed., Starting Mon 07/27/2016, Print    albuterol (PROVENTIL) (2.5 MG/3ML) 0.083% nebulizer solution Take 3 mLs (2.5 mg total) by nebulization every 6 (six) hours as needed for wheezing or shortness of breath., Starting 03/25/2015, Until Discontinued, Normal    albuterol (VENTOLIN HFA) 108 (90 Base) MCG/ACT inhaler Inhale 2 puffs into the lungs every 4 (four) hours as needed for wheezing or shortness of breath., Starting Mon 08/03/2016, Print    aspirin 325 MG tablet Take 1 tablet (325 mg total) by mouth daily., Starting 06/27/2015, Until Discontinued, Normal    atorvastatin (LIPITOR) 20 MG tablet TAKE 1 TABLET BY MOUTH DAILY, Normal    diclofenac sodium (VOLTAREN) 1 % GEL Apply 4 g topically daily as needed (for pain)., Starting Mon 11/25/2015, Normal    exenatide (BYETTA 5 MCG PEN) 5 MCG/0.02ML SOPN injection INJECT 5 MCG INTO THE SKIN 2 TIMES DAILY WITH A MEAL., Print    ferrous sulfate 325 (65 FE) MG tablet Take 1 tablet (325 mg total) by mouth 2 (two) times daily with a meal., Starting Tue 07/28/2016, Normal    fluticasone (FLONASE) 50 MCG/ACT nasal spray Place 2 sprays into both nostrils daily., Starting 10/01/2015, Until Discontinued, Normal    Fluticasone-Salmeterol (ADVAIR DISKUS) 250-50 MCG/DOSE AEPB Inhale 1 puff into the lungs 2 (two) times daily., Starting Tue 09/01/2016, Normal    gabapentin (NEURONTIN) 100 MG capsule Take 2 capsules (200 mg total) by mouth 3 (three) times daily., Starting Tue 09/01/2016, Normal    hydrOXYzine (ATARAX/VISTARIL) 50 MG tablet Take 1 tablet (50 mg total) by mouth 3 (three) times daily as needed., Starting Mon 07/27/2016, Normal      insulin aspart (NOVOLOG) 100 UNIT/ML FlexPen Inject 10 Units into the skin 3 (three) times daily with meals., Starting Mon 08/03/2016, Print    Insulin Glargine (LANTUS SOLOSTAR) 100 UNIT/ML Solostar Pen Inject 50 Units into the skin daily at 10 pm., Starting Mon 08/03/2016, Print    ketotifen (ZADITOR) 0.025 % ophthalmic solution Place 1 drop into both eyes daily as needed (for dry eyes)., Starting Mon 07/27/2016, Normal    ranitidine (ZANTAC) 150 MG tablet Take 1 tablet (150 mg total) by mouth 2 (two) times daily., Starting Mon 07/27/2016, Normal    sitaGLIPtin (JANUVIA) 50 MG tablet Take 1 tablet (50 mg total) by mouth daily., Starting Mon 08/03/2016, Print    valACYclovir (VALTREX) 500 MG tablet TAKE 1 TABLET BY MOUTH DAILY., Normal       Allergies  Allergen Reactions  . Shellfish Allergy Anaphylaxis and Swelling  . Diazepam Other (See Comments)    "felt like out of body experience"  . Morphine And Related Itching   Discharge Instructions    Diet - low sodium heart healthy    Complete by:  As directed    Discharge instructions    Complete by:  As directed    It  is important that you read following instructions as well as go over your medication list with RN to help you understand your care after this hospitalization.  Discharge Instructions: Please follow-up with PCP in one week Check your BP daily, maintain a log and take it with you to doctor's appointment.  Please request your primary care physician to go over all Hospital Tests and Procedure/Radiological results at the follow up,  Please get all Hospital records sent to your PCP by signing hospital release before you go home.   Do not take more than prescribed Pain, Sleep and Anxiety Medications. You were cared for by a hospitalist during your hospital stay. If you have any questions about your discharge medications or the care you received while you were in the hospital after you are discharged, you can call the unit and ask  to speak with the hospitalist on call if the hospitalist that took care of you is not available.  Once you are discharged, your primary care physician will handle any further medical issues. Please note that NO REFILLS for any discharge medications will be authorized once you are discharged, as it is imperative that you return to your primary care physician (or establish a relationship with a primary care physician if you do not have one) for your aftercare needs so that they can reassess your need for medications and monitor your lab values. You Must read complete instructions/literature along with all the possible adverse reactions/side effects for all the Medicines you take and that have been prescribed to you. Take any new Medicines after you have completely understood and accept all the possible adverse reactions/side effects. Wear Seat belts while driving. If you have smoked or chewed Tobacco in the last 2 yrs please stop smoking and/or stop any Recreational drug use.   Increase activity slowly    Complete by:  As directed      Discharge Exam: Filed Weights   09/04/16 0224 09/05/16 0517  Weight: 133.9 kg (295 lb 3.2 oz) 134.3 kg (296 lb)   Vitals:   09/05/16 0844 09/05/16 1236  BP: (!) 148/57 (!) 142/53  Pulse: 75 64  Resp: 18 20  Temp:  98.5 F (36.9 C)   General: Appear in no distress, no Rash; Oral Mucosa moist. Cardiovascular: S1 and S2 Present, no Murmur, no JVD Respiratory: Bilateral Air entry present and Clear to Auscultation, no Crackles, no wheezes Abdomen: Bowel Sound present, Soft and no tenderness Extremities: no Pedal edema, no calf tenderness Neurology: Grossly no focal neuro deficit.  The results of significant diagnostics from this hospitalization (including imaging, microbiology, ancillary and laboratory) are listed below for reference.    Significant Diagnostic Studies: Dg Chest 2 View  Result Date: 09/03/2016 CLINICAL DATA:  Pt reports sharp chest pain and  SOB that started yesterday but worsened today. Denies n/v. She states "an achy" pain radiates down her left arm EXAM: CHEST - 2 VIEW COMPARISON:  03/19/2015 FINDINGS: Lungs are clear. Heart size and mediastinal contours are within normal limits. No effusion. Visualized bones unremarkable. IMPRESSION: No acute cardiopulmonary disease. Electronically Signed   By: Lucrezia Europe M.D.   On: 09/03/2016 19:37    Microbiology: No results found for this or any previous visit (from the past 240 hour(s)).   Labs: CBC:  Recent Labs Lab 09/03/16 1924 09/04/16 0622 09/05/16 0439  WBC 7.6 6.0 6.9  HGB 9.7* 8.3* 8.9*  HCT 30.8* 26.3* 28.5*  MCV 88.3 88.9 88.0  PLT 192 188 172  Basic Metabolic Panel:  Recent Labs Lab 09/03/16 1924 09/04/16 0622 09/05/16 0439  NA 140 138 139  K 5.4* 4.5 4.5  CL 113* 111 111  CO2 18* 18* 21*  GLUCOSE 162* 136* 174*  BUN 27* 27* 32*  CREATININE 1.82* 1.55* 1.61*  CALCIUM 8.8* 8.5* 8.4*   Liver Function Tests: No results for input(s): AST, ALT, ALKPHOS, BILITOT, PROT, ALBUMIN in the last 168 hours. No results for input(s): LIPASE, AMYLASE in the last 168 hours. No results for input(s): AMMONIA in the last 168 hours. Cardiac Enzymes:  Recent Labs Lab 09/04/16 0136 09/04/16 0738 09/04/16 1327  TROPONINI <0.03 <0.03 <0.03   BNP (last 3 results)  Recent Labs  09/01/16 1151  BNP 45.8   CBG:  Recent Labs Lab 09/04/16 1240 09/04/16 1630 09/04/16 2257 09/05/16 0747 09/05/16 1126  GLUCAP 125* 145* 161* 125* 128*   Time spent: 30 minutes  Signed:  Deaundre Allston  [facility] 09/05/2016 , 3:30 PM

## 2016-09-09 NOTE — Telephone Encounter (Signed)
Pt was called and informed of lab results. 

## 2016-09-13 ENCOUNTER — Inpatient Hospital Stay (HOSPITAL_COMMUNITY)
Admission: EM | Admit: 2016-09-13 | Discharge: 2016-09-19 | DRG: 246 | Disposition: A | Discharge status: Home / Self Care | Payer: Medicaid Other | Attending: Cardiovascular Disease | Admitting: Cardiovascular Disease

## 2016-09-13 ENCOUNTER — Emergency Department (HOSPITAL_COMMUNITY): Payer: Medicaid Other

## 2016-09-13 ENCOUNTER — Encounter (HOSPITAL_COMMUNITY): Payer: Self-pay | Admitting: *Deleted

## 2016-09-13 DIAGNOSIS — Z955 Presence of coronary angioplasty implant and graft: Secondary | ICD-10-CM

## 2016-09-13 DIAGNOSIS — E1122 Type 2 diabetes mellitus with diabetic chronic kidney disease: Secondary | ICD-10-CM | POA: Diagnosis present

## 2016-09-13 DIAGNOSIS — E1165 Type 2 diabetes mellitus with hyperglycemia: Secondary | ICD-10-CM | POA: Diagnosis present

## 2016-09-13 DIAGNOSIS — R7989 Other specified abnormal findings of blood chemistry: Secondary | ICD-10-CM

## 2016-09-13 DIAGNOSIS — Z794 Long term (current) use of insulin: Secondary | ICD-10-CM

## 2016-09-13 DIAGNOSIS — B182 Chronic viral hepatitis C: Secondary | ICD-10-CM | POA: Diagnosis present

## 2016-09-13 DIAGNOSIS — Z885 Allergy status to narcotic agent status: Secondary | ICD-10-CM

## 2016-09-13 DIAGNOSIS — Z6841 Body Mass Index (BMI) 40.0 and over, adult: Secondary | ICD-10-CM | POA: Diagnosis not present

## 2016-09-13 DIAGNOSIS — G4733 Obstructive sleep apnea (adult) (pediatric): Secondary | ICD-10-CM | POA: Diagnosis present

## 2016-09-13 DIAGNOSIS — E118 Type 2 diabetes mellitus with unspecified complications: Secondary | ICD-10-CM

## 2016-09-13 DIAGNOSIS — Z7982 Long term (current) use of aspirin: Secondary | ICD-10-CM

## 2016-09-13 DIAGNOSIS — Z7951 Long term (current) use of inhaled steroids: Secondary | ICD-10-CM

## 2016-09-13 DIAGNOSIS — I25110 Atherosclerotic heart disease of native coronary artery with unstable angina pectoris: Secondary | ICD-10-CM

## 2016-09-13 DIAGNOSIS — N183 Chronic kidney disease, stage 3 unspecified: Secondary | ICD-10-CM | POA: Diagnosis present

## 2016-09-13 DIAGNOSIS — N184 Chronic kidney disease, stage 4 (severe): Secondary | ICD-10-CM | POA: Diagnosis present

## 2016-09-13 DIAGNOSIS — R0602 Shortness of breath: Secondary | ICD-10-CM | POA: Diagnosis present

## 2016-09-13 DIAGNOSIS — Z87891 Personal history of nicotine dependence: Secondary | ICD-10-CM | POA: Diagnosis not present

## 2016-09-13 DIAGNOSIS — G4710 Hypersomnia, unspecified: Secondary | ICD-10-CM | POA: Diagnosis present

## 2016-09-13 DIAGNOSIS — J45909 Unspecified asthma, uncomplicated: Secondary | ICD-10-CM | POA: Diagnosis present

## 2016-09-13 DIAGNOSIS — I214 Non-ST elevation (NSTEMI) myocardial infarction: Principal | ICD-10-CM | POA: Diagnosis present

## 2016-09-13 DIAGNOSIS — I313 Pericardial effusion (noninflammatory): Secondary | ICD-10-CM | POA: Diagnosis present

## 2016-09-13 DIAGNOSIS — I130 Hypertensive heart and chronic kidney disease with heart failure and stage 1 through stage 4 chronic kidney disease, or unspecified chronic kidney disease: Secondary | ICD-10-CM | POA: Diagnosis present

## 2016-09-13 DIAGNOSIS — K219 Gastro-esophageal reflux disease without esophagitis: Secondary | ICD-10-CM | POA: Diagnosis present

## 2016-09-13 DIAGNOSIS — D631 Anemia in chronic kidney disease: Secondary | ICD-10-CM | POA: Diagnosis present

## 2016-09-13 DIAGNOSIS — N179 Acute kidney failure, unspecified: Secondary | ICD-10-CM | POA: Diagnosis present

## 2016-09-13 DIAGNOSIS — E1140 Type 2 diabetes mellitus with diabetic neuropathy, unspecified: Secondary | ICD-10-CM | POA: Diagnosis present

## 2016-09-13 DIAGNOSIS — Z79899 Other long term (current) drug therapy: Secondary | ICD-10-CM

## 2016-09-13 DIAGNOSIS — I5033 Acute on chronic diastolic (congestive) heart failure: Secondary | ICD-10-CM | POA: Diagnosis present

## 2016-09-13 DIAGNOSIS — Z91013 Allergy to seafood: Secondary | ICD-10-CM

## 2016-09-13 DIAGNOSIS — Z888 Allergy status to other drugs, medicaments and biological substances status: Secondary | ICD-10-CM

## 2016-09-13 DIAGNOSIS — Z9114 Patient's other noncompliance with medication regimen: Secondary | ICD-10-CM

## 2016-09-13 DIAGNOSIS — I309 Acute pericarditis, unspecified: Secondary | ICD-10-CM

## 2016-09-13 DIAGNOSIS — I252 Old myocardial infarction: Secondary | ICD-10-CM | POA: Diagnosis present

## 2016-09-13 DIAGNOSIS — E1100 Type 2 diabetes mellitus with hyperosmolarity without nonketotic hyperglycemic-hyperosmolar coma (NKHHC): Secondary | ICD-10-CM | POA: Diagnosis present

## 2016-09-13 DIAGNOSIS — I161 Hypertensive emergency: Secondary | ICD-10-CM | POA: Diagnosis present

## 2016-09-13 DIAGNOSIS — R778 Other specified abnormalities of plasma proteins: Secondary | ICD-10-CM

## 2016-09-13 DIAGNOSIS — I10 Essential (primary) hypertension: Secondary | ICD-10-CM | POA: Diagnosis present

## 2016-09-13 DIAGNOSIS — I5041 Acute combined systolic (congestive) and diastolic (congestive) heart failure: Secondary | ICD-10-CM

## 2016-09-13 DIAGNOSIS — Z8249 Family history of ischemic heart disease and other diseases of the circulatory system: Secondary | ICD-10-CM

## 2016-09-13 DIAGNOSIS — I5031 Acute diastolic (congestive) heart failure: Secondary | ICD-10-CM

## 2016-09-13 HISTORY — DX: Atherosclerotic heart disease of native coronary artery without angina pectoris: I25.10

## 2016-09-13 LAB — LIPID PANEL
Cholesterol: 122 mg/dL (ref 0–200)
HDL: 36 mg/dL — ABNORMAL LOW (ref >40)
LDL Cholesterol: 52 mg/dL (ref 0–99)
Total CHOL/HDL Ratio: 3.4 RATIO
Triglycerides: 168 mg/dL — ABNORMAL HIGH (ref <150)
VLDL: 34 mg/dL (ref 0–40)

## 2016-09-13 LAB — CBC WITH DIFFERENTIAL/PLATELET
Basophils Absolute: 0 10*3/uL (ref 0.0–0.1)
Basophils Relative: 0 %
Eosinophils Absolute: 0.3 10*3/uL (ref 0.0–0.7)
Eosinophils Relative: 3 %
HCT: 31.2 % — ABNORMAL LOW (ref 36.0–46.0)
Hemoglobin: 10 g/dL — ABNORMAL LOW (ref 12.0–15.0)
Lymphocytes Relative: 22 %
Lymphs Abs: 2.1 10*3/uL (ref 0.7–4.0)
MCH: 28 pg (ref 26.0–34.0)
MCHC: 32.1 g/dL (ref 30.0–36.0)
MCV: 87.4 fL (ref 78.0–100.0)
Monocytes Absolute: 0.6 10*3/uL (ref 0.1–1.0)
Monocytes Relative: 6 %
Neutro Abs: 6.5 10*3/uL (ref 1.7–7.7)
Neutrophils Relative %: 69 %
Platelets: 247 10*3/uL (ref 150–400)
RBC: 3.57 MIL/uL — ABNORMAL LOW (ref 3.87–5.11)
RDW: 14.2 % (ref 11.5–15.5)
WBC: 9.6 10*3/uL (ref 4.0–10.5)

## 2016-09-13 LAB — HEPARIN LEVEL (UNFRACTIONATED)
Heparin Unfractionated: 0.1 IU/mL — ABNORMAL LOW (ref 0.30–0.70)
Heparin Unfractionated: <0.1 IU/mL — ABNORMAL LOW (ref 0.30–0.70)

## 2016-09-13 LAB — COMPREHENSIVE METABOLIC PANEL
ALT: 36 U/L (ref 14–54)
AST: 27 U/L (ref 15–41)
Albumin: 3.4 g/dL — ABNORMAL LOW (ref 3.5–5.0)
Alkaline Phosphatase: 200 U/L — ABNORMAL HIGH (ref 38–126)
Anion gap: 9 (ref 5–15)
BUN: 30 mg/dL — ABNORMAL HIGH (ref 6–20)
CO2: 23 mmol/L (ref 22–32)
Calcium: 8.5 mg/dL — ABNORMAL LOW (ref 8.9–10.3)
Chloride: 107 mmol/L (ref 101–111)
Creatinine, Ser: 1.73 mg/dL — ABNORMAL HIGH (ref 0.44–1.00)
GFR calc Af Amer: 36 mL/min — ABNORMAL LOW (ref >60)
GFR calc non Af Amer: 31 mL/min — ABNORMAL LOW (ref >60)
Glucose, Bld: 259 mg/dL — ABNORMAL HIGH (ref 65–99)
Potassium: 4.1 mmol/L (ref 3.5–5.1)
Sodium: 139 mmol/L (ref 135–145)
Total Bilirubin: 0.6 mg/dL (ref 0.3–1.2)
Total Protein: 6.8 g/dL (ref 6.5–8.1)

## 2016-09-13 LAB — CBG MONITORING, ED
Glucose-Capillary: 230 mg/dL — ABNORMAL HIGH (ref 65–99)
Glucose-Capillary: 220 mg/dL — ABNORMAL HIGH (ref 65–99)
Glucose-Capillary: 215 mg/dL — ABNORMAL HIGH (ref 65–99)
Glucose-Capillary: 176 mg/dL — ABNORMAL HIGH (ref 65–99)

## 2016-09-13 LAB — GLUCOSE, CAPILLARY
Glucose-Capillary: 154 mg/dL — ABNORMAL HIGH (ref 65–99)
Glucose-Capillary: 201 mg/dL — ABNORMAL HIGH (ref 65–99)

## 2016-09-13 LAB — BRAIN NATRIURETIC PEPTIDE
B Natriuretic Peptide: 430.9 pg/mL — ABNORMAL HIGH (ref 0.0–100.0)
B Natriuretic Peptide: 554.8 pg/mL — ABNORMAL HIGH (ref 0.0–100.0)

## 2016-09-13 LAB — MRSA PCR SCREENING: MRSA by PCR: NEGATIVE

## 2016-09-13 LAB — INFLUENZA PANEL BY PCR (TYPE A & B)
Influenza A By PCR: NEGATIVE
Influenza B By PCR: NEGATIVE

## 2016-09-13 LAB — I-STAT TROPONIN, ED: Troponin i, poc: 0.42 ng/mL (ref 0.00–0.08)

## 2016-09-13 LAB — TROPONIN I
Troponin I: 0.65 ng/mL (ref <0.03)
Troponin I: 0.82 ng/mL (ref <0.03)
Troponin I: 0.86 ng/mL (ref <0.03)

## 2016-09-13 LAB — LIPASE, BLOOD: Lipase: 20 U/L (ref 11–51)

## 2016-09-13 LAB — TSH: TSH: 1.86 u[IU]/mL (ref 0.350–4.500)

## 2016-09-13 MED ORDER — ISOSORBIDE MONONITRATE ER 30 MG PO TB24
30.0000 mg | ORAL_TABLET | Freq: Every day | ORAL | Status: DC
  Administered 2016-09-13: 30 mg via ORAL
  Filled 2016-09-13: qty 1

## 2016-09-13 MED ORDER — GABAPENTIN 100 MG PO CAPS
200.0000 mg | ORAL_CAPSULE | Freq: Three times a day (TID) | ORAL | Status: DC
  Administered 2016-09-13 – 2016-09-19 (×19): 200 mg via ORAL
  Filled 2016-09-13 (×19): qty 2

## 2016-09-13 MED ORDER — HEPARIN (PORCINE) IN NACL 100-0.45 UNIT/ML-% IJ SOLN
1600.0000 [IU]/h | INTRAMUSCULAR | Status: DC
  Administered 2016-09-13: 1000 [IU]/h via INTRAVENOUS
  Administered 2016-09-15 – 2016-09-16 (×2): 1600 [IU]/h via INTRAVENOUS
  Filled 2016-09-13 (×6): qty 250

## 2016-09-13 MED ORDER — HEPARIN BOLUS VIA INFUSION
4000.0000 [IU] | Freq: Once | INTRAVENOUS | Status: AC
  Administered 2016-09-13: 4000 [IU] via INTRAVENOUS
  Filled 2016-09-13: qty 4000

## 2016-09-13 MED ORDER — HYDRALAZINE HCL 50 MG PO TABS
50.0000 mg | ORAL_TABLET | Freq: Three times a day (TID) | ORAL | Status: DC
  Administered 2016-09-13 – 2016-09-19 (×19): 50 mg via ORAL
  Filled 2016-09-13 (×19): qty 1

## 2016-09-13 MED ORDER — VANCOMYCIN HCL 10 G IV SOLR
1500.0000 mg | INTRAVENOUS | Status: DC
  Administered 2016-09-14 – 2016-09-15 (×2): 1500 mg via INTRAVENOUS
  Filled 2016-09-13 (×2): qty 1500

## 2016-09-13 MED ORDER — PIPERACILLIN-TAZOBACTAM 3.375 G IVPB
3.3750 g | Freq: Three times a day (TID) | INTRAVENOUS | Status: DC
  Administered 2016-09-13 – 2016-09-15 (×7): 3.375 g via INTRAVENOUS
  Filled 2016-09-13 (×11): qty 50

## 2016-09-13 MED ORDER — INSULIN GLARGINE 100 UNIT/ML ~~LOC~~ SOLN
50.0000 [IU] | Freq: Every day | SUBCUTANEOUS | Status: DC
  Administered 2016-09-13 – 2016-09-19 (×5): 50 [IU] via SUBCUTANEOUS
  Filled 2016-09-13 (×7): qty 0.5

## 2016-09-13 MED ORDER — INSULIN ASPART 100 UNIT/ML ~~LOC~~ SOLN
10.0000 [IU] | Freq: Three times a day (TID) | SUBCUTANEOUS | Status: DC
  Administered 2016-09-13 – 2016-09-19 (×12): 10 [IU] via SUBCUTANEOUS

## 2016-09-13 MED ORDER — SODIUM CHLORIDE 0.9% FLUSH
3.0000 mL | Freq: Two times a day (BID) | INTRAVENOUS | Status: DC
  Administered 2016-09-13 – 2016-09-16 (×6): 3 mL via INTRAVENOUS

## 2016-09-13 MED ORDER — MORPHINE SULFATE (PF) 4 MG/ML IV SOLN
2.0000 mg | INTRAVENOUS | Status: DC | PRN
  Administered 2016-09-13 – 2016-09-19 (×6): 2 mg via INTRAVENOUS
  Filled 2016-09-13 (×6): qty 1

## 2016-09-13 MED ORDER — KETOTIFEN FUMARATE 0.025 % OP SOLN
1.0000 [drp] | Freq: Every day | OPHTHALMIC | Status: DC | PRN
  Filled 2016-09-13: qty 5

## 2016-09-13 MED ORDER — HEPARIN BOLUS VIA INFUSION
3000.0000 [IU] | Freq: Once | INTRAVENOUS | Status: AC
  Administered 2016-09-13: 3000 [IU] via INTRAVENOUS
  Filled 2016-09-13: qty 3000

## 2016-09-13 MED ORDER — FUROSEMIDE 10 MG/ML IJ SOLN
40.0000 mg | Freq: Once | INTRAMUSCULAR | Status: AC
  Administered 2016-09-13: 40 mg via INTRAVENOUS
  Filled 2016-09-13: qty 4

## 2016-09-13 MED ORDER — AMLODIPINE BESYLATE 2.5 MG PO TABS
2.5000 mg | ORAL_TABLET | Freq: Every day | ORAL | Status: DC
  Administered 2016-09-13 – 2016-09-14 (×2): 2.5 mg via ORAL
  Filled 2016-09-13 (×2): qty 1

## 2016-09-13 MED ORDER — VALACYCLOVIR HCL 500 MG PO TABS
500.0000 mg | ORAL_TABLET | Freq: Every day | ORAL | Status: DC
  Administered 2016-09-13 – 2016-09-19 (×7): 500 mg via ORAL
  Filled 2016-09-13 (×7): qty 1

## 2016-09-13 MED ORDER — CARVEDILOL 12.5 MG PO TABS
25.0000 mg | ORAL_TABLET | Freq: Two times a day (BID) | ORAL | Status: DC
  Administered 2016-09-13 – 2016-09-19 (×12): 25 mg via ORAL
  Filled 2016-09-13: qty 1
  Filled 2016-09-13 (×2): qty 2
  Filled 2016-09-13: qty 1
  Filled 2016-09-13: qty 2
  Filled 2016-09-13 (×8): qty 1

## 2016-09-13 MED ORDER — ASPIRIN 81 MG PO CHEW
324.0000 mg | CHEWABLE_TABLET | Freq: Once | ORAL | Status: AC
Start: 2016-09-13 — End: 2016-09-13

## 2016-09-13 MED ORDER — HYDRALAZINE HCL 20 MG/ML IJ SOLN
10.0000 mg | Freq: Four times a day (QID) | INTRAMUSCULAR | Status: DC | PRN
  Administered 2016-09-13 – 2016-09-18 (×2): 10 mg via INTRAVENOUS
  Filled 2016-09-13 (×4): qty 1

## 2016-09-13 MED ORDER — ASPIRIN EC 81 MG PO TBEC
81.0000 mg | DELAYED_RELEASE_TABLET | Freq: Every day | ORAL | Status: DC
  Administered 2016-09-13 – 2016-09-19 (×6): 81 mg via ORAL
  Filled 2016-09-13 (×6): qty 1

## 2016-09-13 MED ORDER — NITROGLYCERIN 0.4 MG SL SUBL
0.4000 mg | SUBLINGUAL_TABLET | SUBLINGUAL | Status: AC | PRN
  Administered 2016-09-13 (×3): 0.4 mg via SUBLINGUAL
  Filled 2016-09-13: qty 1

## 2016-09-13 MED ORDER — INSULIN ASPART 100 UNIT/ML ~~LOC~~ SOLN
0.0000 [IU] | Freq: Every day | SUBCUTANEOUS | Status: DC
  Administered 2016-09-13: 2 [IU] via SUBCUTANEOUS

## 2016-09-13 MED ORDER — ALBUTEROL SULFATE (2.5 MG/3ML) 0.083% IN NEBU
2.5000 mg | INHALATION_SOLUTION | Freq: Four times a day (QID) | RESPIRATORY_TRACT | Status: DC | PRN
  Administered 2016-09-13 – 2016-09-17 (×3): 2.5 mg via RESPIRATORY_TRACT
  Filled 2016-09-13 (×3): qty 3

## 2016-09-13 MED ORDER — NITROGLYCERIN IN D5W 200-5 MCG/ML-% IV SOLN
0.0000 ug/min | INTRAVENOUS | Status: DC
  Administered 2016-09-13: 15 ug/min via INTRAVENOUS
  Filled 2016-09-13: qty 250

## 2016-09-13 MED ORDER — COLCHICINE 0.6 MG PO TABS
0.6000 mg | ORAL_TABLET | Freq: Two times a day (BID) | ORAL | Status: DC
  Administered 2016-09-13 – 2016-09-19 (×13): 0.6 mg via ORAL
  Filled 2016-09-13 (×13): qty 1

## 2016-09-13 MED ORDER — ATORVASTATIN CALCIUM 80 MG PO TABS
80.0000 mg | ORAL_TABLET | Freq: Every day | ORAL | Status: DC
  Administered 2016-09-13 – 2016-09-18 (×6): 80 mg via ORAL
  Filled 2016-09-13 (×6): qty 1

## 2016-09-13 MED ORDER — INSULIN ASPART 100 UNIT/ML ~~LOC~~ SOLN
0.0000 [IU] | Freq: Three times a day (TID) | SUBCUTANEOUS | Status: DC
  Administered 2016-09-13: 3 [IU] via SUBCUTANEOUS
  Administered 2016-09-13: 2 [IU] via SUBCUTANEOUS
  Administered 2016-09-13: 3 [IU] via SUBCUTANEOUS
  Administered 2016-09-14 (×2): 2 [IU] via SUBCUTANEOUS
  Administered 2016-09-15 (×2): 1 [IU] via SUBCUTANEOUS
  Administered 2016-09-15: 3 [IU] via SUBCUTANEOUS
  Administered 2016-09-16 (×2): 1 [IU] via SUBCUTANEOUS
  Administered 2016-09-16: 2 [IU] via SUBCUTANEOUS
  Administered 2016-09-17: 3 [IU] via SUBCUTANEOUS
  Administered 2016-09-17 (×2): 1 [IU] via SUBCUTANEOUS
  Administered 2016-09-18 (×3): 2 [IU] via SUBCUTANEOUS
  Administered 2016-09-19: 07:00:00 3 [IU] via SUBCUTANEOUS
  Filled 2016-09-13 (×2): qty 1

## 2016-09-13 MED ORDER — PANTOPRAZOLE SODIUM 40 MG IV SOLR
40.0000 mg | INTRAVENOUS | Status: DC
  Administered 2016-09-13 – 2016-09-15 (×3): 40 mg via INTRAVENOUS
  Filled 2016-09-13 (×3): qty 40

## 2016-09-13 MED ORDER — FUROSEMIDE 10 MG/ML IJ SOLN
40.0000 mg | Freq: Four times a day (QID) | INTRAMUSCULAR | Status: AC
  Administered 2016-09-13 – 2016-09-14 (×4): 40 mg via INTRAVENOUS
  Filled 2016-09-13 (×4): qty 4

## 2016-09-13 MED ORDER — VANCOMYCIN HCL 10 G IV SOLR
2500.0000 mg | Freq: Once | INTRAVENOUS | Status: AC
  Administered 2016-09-13: 2500 mg via INTRAVENOUS
  Filled 2016-09-13 (×2): qty 2500

## 2016-09-13 NOTE — Progress Notes (Signed)
Pharmacy Antibiotic Note  [name] is a 61 y.o. female admitted on 09/13/2016 with pneumonia.  Pharmacy has been consulted for vancomycin and Zosyn dosing.  WBC normal and patient AFeb in ED. No cultures ordered at this time. No procalcitonin or lactic acid checked.  Plan: Vancomycin 2500mg  IV x1 as load, then 1500 IV every 24 hours as per obesity nomogram.  Goal trough 15-20 mcg/mL. Zosyn 3.375g IV q8h (4 hour infusion).     Temp (24hrs), Avg:97.4 F (36.3 C), Min:97.4 F (36.3 C), Max:97.4 F (36.3 C)   Recent Labs Lab 09/13/16 0418  WBC 9.6  CREATININE 1.73*    Estimated Creatinine Clearance: 51.5 mL/min (by C-G formula based on SCr of 1.73 mg/dL (H)).    Allergies  Allergen Reactions  . Shellfish Allergy Anaphylaxis and Swelling  . Diazepam Other (See Comments)    "felt like out of body experience"  . Morphine And Related Itching    Antimicrobials this admission: Vanc 3/4 >>  Zosyn 3/4 >>   Dose adjustments this admission: n/a  Microbiology results: Nothing ordered at this time  [name] D. Tremaine Earwood, PharmD, BCPS Clinical Pharmacist Pager: [phone] 09/13/2016 2:13 PM

## 2016-09-13 NOTE — ED Triage Notes (Signed)
Pt to ED by [facility] c/o chest pain since yesterday with progressive sob. Initial room air sats 86%, improved with CPAP, sats at 95%. Pt Taken off lasix 2 weeks ago  Vs 146/92, pulse 73

## 2016-09-13 NOTE — Progress Notes (Signed)
ANTICOAGULATION CONSULT NOTE - Initial Consult  Pharmacy Consult for Heparin  Indication: chest pain/ACS  Allergies  Allergen Reactions  . Shellfish Allergy Anaphylaxis and Swelling  . Diazepam Other (See Comments)    "felt like out of body experience"  . Morphine And Related Itching   Vital Signs: Temp: 97.4 F (36.3 C) (03/04 0421) BP: 175/109 (03/04 0415) Pulse Rate: 72 (03/04 0415)  Labs:  Recent Labs  09/13/16 0418  HGB 10.0*  HCT 31.2*  PLT 247    Estimated Creatinine Clearance: 55.4 mL/min (by C-G formula based on SCr of 1.61 mg/dL (H)).   Medical History: Past Medical History:  Diagnosis Date  . Asthma    As a child   . Bronchitis   . CHF (congestive heart failure) ([place]) 2011  . Complication of anesthesia   . Diabetes mellitus Dx 1989  . Hepatitis C Dx 2013  . Hypertension Dx 1989  . Obesity   . Pancreatitis 2013  . Refusal of blood transfusions as patient is Jehov[employer]'s Witness   . Ulcer ([place]) 2010   Assessment: Heparin for chest pain/shortness of breath, POC troponin elevated, Hgb 10, plts good, noted renal dysfunction, PTA meds reviewed.   Goal of Therapy:  Heparin level 0.3-0.7 units/ml Monitor platelets by anticoagulation protocol: Yes   Plan:  -Heparin 4000 units BOLUS -Start heparin drip at 1000 units/hr -1300 HL -Daily CBC/HL -Monitor for bleeding   Narda Bonds 09/13/2016,4:39 AM

## 2016-09-13 NOTE — H&P (Signed)
CARDIOLOGY INPATIENT HISTORY AND PHYSICAL EXAMINATION NOTE  Patient ID: [name] MRN: [id number], DOB/AGE: 59-Sep-1957   Admit date: 09/13/2016   Primary Physician: Minerva Ends, MD Primary Cardiologist: Dr. Burt Knack   Reason for admission: SOB  HPI: This is a 61 y.o. AA female with chronic HFpEF, HTN, IDDM, remote tobacco use, untreated OSA and several readmissions presented with SOB and uncontrolled HTN.  Patient was recently admitted and seen by cardiology in patient for chest pain. Patient during her last admission 2 weeks ago had negative troponin x 2 w/Hb of 8.3 and was managed conservatively however this time she presented with left sided chest pain present under the breast, constant in nature, w/o radiation, not associated with diaphoresis, nausea/vomiting, hemoptysis, PE or DVT, trauma, recent infection but associated with SOB, PND, orthopnea and leg swelling.  Patient's lisinopril was stopped during last admission and she has not been taking it.  EKG also changed on this admission with T wave inversion in the anterior chest leads. Creatinine has remained stable. Her BP is again elevated. On last visit there was consideration of Lexiscan myoview as outpatient which patient did not have.    Problem List: Past Medical History:  Diagnosis Date  . Asthma    As a child   . Bronchitis   . CHF (congestive heart failure) ([place]) 2011  . Complication of anesthesia   . Diabetes mellitus Dx 1989  . Hepatitis C Dx 2013  . Hypertension Dx 1989  . Obesity   . Pancreatitis 2013  . Refusal of blood transfusions as patient is Jehovah's Witness   . Ulcer ([place]) 2010    Past Surgical History:  Procedure Laterality Date  . CHOLECYSTECTOMY    . EYE SURGERY    . KNEE ARTHROSCOPY    . TUBAL LIGATION    . TUBAL LIGATION       Allergies:  Allergies  Allergen Reactions  . Shellfish Allergy Anaphylaxis and Swelling  . Diazepam Other (See Comments)    "felt like out of body  experience"  . Morphine And Related Itching     Home Medications Current Facility-Administered Medications  Medication Dose Route Frequency Provider Last Rate Last Dose  . heparin ADULT infusion 100 units/mL (25000 units/225mL sodium chloride 0.45%)  1,000 Units/hr Intravenous Continuous Erenest Blank, RPH 10 mL/hr at 09/13/16 0508 1,000 Units/hr at 09/13/16 0508  . hydrALAZINE (APRESOLINE) injection 10 mg  10 mg Intravenous Q6H PRN Flossie Dibble, MD      . hydrALAZINE (APRESOLINE) tablet 50 mg  50 mg Oral TID Flossie Dibble, MD      . insulin aspart (NOVOLOG) FlexPen 10 Units  10 Units Subcutaneous TID WC Flossie Dibble, MD      . insulin aspart (novoLOG) injection 0-5 Units  0-5 Units Subcutaneous QHS Flossie Dibble, MD      . insulin aspart (novoLOG) injection 0-9 Units  0-9 Units Subcutaneous TID WC Flossie Dibble, MD      . Insulin Glargine (LANTUS) Solostar Pen 50 Units  50 Units Subcutaneous BH-[id number] Flossie Dibble, MD      . isosorbide mononitrate (IMDUR) 24 hr tablet 30 mg  30 mg Oral Daily Flossie Dibble, MD      . nitroGLYCERIN (NITROSTAT) SL tablet 0.4 mg  0.4 mg Sublingual Q5 min PRN Deno Etienne, DO       Current Outpatient Prescriptions  Medication Sig Dispense Refill  . acetaminophen-codeine (TYLENOL #3) 300-30 MG tablet Take 1  tablet by mouth every 8 (eight) hours as needed. (Patient taking differently: Take 1 tablet by mouth every 8 (eight) hours as needed for moderate pain. ) 90 tablet 2  . albuterol (PROVENTIL) (2.5 MG/3ML) 0.083% nebulizer solution Take 3 mLs (2.5 mg total) by nebulization every 6 (six) hours as needed for wheezing or shortness of breath. 150 mL 1  . albuterol (VENTOLIN HFA) 108 (90 Base) MCG/ACT inhaler Inhale 2 puffs into the lungs every 4 (four) hours as needed for wheezing or shortness of breath. 54 g 3  . aspirin 325 MG tablet Take 1 tablet (325 mg total) by mouth daily. 30 tablet 3  . atorvastatin (LIPITOR) 20 MG tablet TAKE 1 TABLET BY MOUTH  DAILY 30 tablet 3  . carvedilol (COREG) 12.5 MG tablet Take 2 tablets (25 mg total) by mouth 2 (two) times daily with a meal. 60 tablet 0  . diclofenac sodium (VOLTAREN) 1 % GEL Apply 4 g topically daily as needed (for pain). 100 g 2  . exenatide (BYETTA 5 MCG PEN) 5 MCG/0.02ML SOPN injection INJECT 5 MCG INTO THE SKIN 2 TIMES DAILY WITH A MEAL. 3 pen 3  . ferrous sulfate 325 (65 FE) MG tablet Take 1 tablet (325 mg total) by mouth 2 (two) times daily with a meal. 60 tablet 3  . fluticasone (FLONASE) 50 MCG/ACT nasal spray Place 2 sprays into both nostrils daily. (Patient taking differently: Place 2 sprays into both nostrils daily as needed for allergies. ) 16 g 1  . gabapentin (NEURONTIN) 100 MG capsule Take 2 capsules (200 mg total) by mouth 3 (three) times daily. 180 capsule 2  . hydrALAZINE (APRESOLINE) 50 MG tablet Take 1 tablet (50 mg total) by mouth 3 (three) times daily. 90 tablet 0  . hydrOXYzine (ATARAX/VISTARIL) 50 MG tablet Take 1 tablet (50 mg total) by mouth 3 (three) times daily as needed. (Patient taking differently: Take 50 mg by mouth 3 (three) times daily as needed for anxiety. ) 90 tablet 3  . insulin aspart (NOVOLOG) 100 UNIT/ML FlexPen Inject 10 Units into the skin 3 (three) times daily with meals. 30 mL 3  . Insulin Glargine (LANTUS SOLOSTAR) 100 UNIT/ML Solostar Pen Inject 50 Units into the skin daily at 10 pm. (Patient taking differently: Inject 50 Units into the skin every morning. ) 45 mL 3  . ketotifen (ZADITOR) 0.025 % ophthalmic solution Place 1 drop into both eyes daily as needed (for dry eyes). 10 mL 2  . ranitidine (ZANTAC) 150 MG tablet Take 1 tablet (150 mg total) by mouth 2 (two) times daily. 60 tablet 3  . sitaGLIPtin (JANUVIA) 50 MG tablet Take 1 tablet (50 mg total) by mouth daily. 90 tablet 3  . valACYclovir (VALTREX) 500 MG tablet TAKE 1 TABLET BY MOUTH DAILY. 30 tablet 0  . Fluticasone-Salmeterol (ADVAIR DISKUS) 250-50 MCG/DOSE AEPB Inhale 1 puff into the lungs  2 (two) times daily. (Patient not taking: Reported on 09/03/2016) 180 each 5  . isosorbide mononitrate (IMDUR) 30 MG 24 hr tablet Take 1 tablet (30 mg total) by mouth daily. (Patient not taking: Reported on 09/13/2016) 30 tablet 0     Family History  Problem Relation Age of Onset  . Colon cancer Mother   . Heart attack Other   . Heart attack Maternal Grandmother   . Hypertension Sister   . Hypertension Brother   . Diabetes Paternal Grandmother      Social History   Social History  . Marital status: Divorced  Spouse name: N/A  . Number of children: N/A  . Years of education: N/A   Occupational History  . Not on file.   Social History Main Topics  . Smoking status: Former Smoker    Quit date: 10/25/1980  . Smokeless tobacco: Never Used     Comment: quit smoking in 1982  . Alcohol use Yes     Comment: 3 times in last year  . Drug use: No     Comment: 08/21/2016 "clean since 05/1998"  . Sexual activity: Not on file     Comment: Not asked   Other Topics Concern  . Not on file   Social History Narrative  . No narrative on file     Review of Systems: General: negative for chills, fever, night sweats or weight changes.  Cardiovascular: chest pain, dyspnea negative for dyspnea on exertion, edema, PND, orthopnea Dermatological: negative for rash Respiratory: negative for cough or wheezing Urologic: negative for hematuria Abdominal: negative for nausea, vomiting, diarrhea, bright red blood per rectum, melena, or hematemesis Neurologic: negative for visual changes, syncope, or dizziness Endocrine: no diabetes, no hypothyroidism Immunological: no lymph adenopathy Psych: non homicidal/suicidal  Physical Exam: Vitals: BP 179/95   Pulse 64   Temp 97.4 F (36.3 C)   Resp 25   SpO2 98%  General: not in acute distress Neck: JVP flat, neck supple Heart: regular rate and rhythm, S1, S2, no murmurs  Lungs: CTAB  GI: non tender, non distended, bowel sounds  present Extremities: no edema Neuro: AAO x 3  Psych: normal affect, no anxiety   Labs:   Results for orders placed or performed during the hospital encounter of 09/13/16 (from the past 24 hour(s))  CBC with Differential     Status: Abnormal   Collection Time: 09/13/16  4:18 AM  Result Value Ref Range   WBC 9.6 4.0 - 10.5 K/uL   RBC 3.57 (L) 3.87 - 5.11 MIL/uL   Hemoglobin 10.0 (L) 12.0 - 15.0 g/dL   HCT 31.2 (L) 36.0 - 46.0 %   MCV 87.4 78.0 - 100.0 fL   MCH 28.0 26.0 - 34.0 pg   MCHC 32.1 30.0 - 36.0 g/dL   RDW 14.2 11.5 - 15.5 %   Platelets 247 150 - 400 K/uL   Neutrophils Relative % 69 %   Neutro Abs 6.5 1.7 - 7.7 K/uL   Lymphocytes Relative 22 %   Lymphs Abs 2.1 0.7 - 4.0 K/uL   Monocytes Relative 6 %   Monocytes Absolute 0.6 0.1 - 1.0 K/uL   Eosinophils Relative 3 %   Eosinophils Absolute 0.3 0.0 - 0.7 K/uL   Basophils Relative 0 %   Basophils Absolute 0.0 0.0 - 0.1 K/uL  Comprehensive metabolic panel     Status: Abnormal   Collection Time: 09/13/16  4:18 AM  Result Value Ref Range   Sodium 139 135 - 145 mmol/L   Potassium 4.1 3.5 - 5.1 mmol/L   Chloride 107 101 - 111 mmol/L   CO2 23 22 - 32 mmol/L   Glucose, Bld 259 (H) 65 - 99 mg/dL   BUN 30 (H) 6 - 20 mg/dL   Creatinine, Ser 1.73 (H) 0.44 - 1.00 mg/dL   Calcium 8.5 (L) 8.9 - 10.3 mg/dL   Total Protein 6.8 6.5 - 8.1 g/dL   Albumin 3.4 (L) 3.5 - 5.0 g/dL   AST 27 15 - 41 U/L   ALT 36 14 - 54 U/L   Alkaline Phosphatase 200 (H) 38 -  126 U/L   Total Bilirubin 0.6 0.3 - 1.2 mg/dL   GFR calc non Af Amer 31 (L) >60 mL/min   GFR calc Af Amer 36 (L) >60 mL/min   Anion gap 9 5 - 15  Lipase, blood     Status: None   Collection Time: 09/13/16  4:18 AM  Result Value Ref Range   Lipase 20 11 - 51 U/L  Brain natriuretic peptide     Status: Abnormal   Collection Time: 09/13/16  4:18 AM  Result Value Ref Range   B Natriuretic Peptide 430.9 (H) 0.0 - 100.0 pg/mL  I-stat troponin, ED     Status: Abnormal   Collection  Time: 09/13/16  4:21 AM  Result Value Ref Range   Troponin i, poc 0.42 (HH) 0.00 - 0.08 ng/mL   Comment NOTIFIED PHYSICIAN    Comment 3             Radiology/Studies: Dg Chest 2 View  Result Date: 09/03/2016 CLINICAL DATA:  Pt reports sharp chest pain and SOB that started yesterday but worsened today. Denies n/v. She states "an achy" pain radiates down her left arm EXAM: CHEST - 2 VIEW COMPARISON:  03/19/2015 FINDINGS: Lungs are clear. Heart size and mediastinal contours are within normal limits. No effusion. Visualized bones unremarkable. IMPRESSION: No acute cardiopulmonary disease. Electronically Signed   By: Lucrezia Europe M.D.   On: 09/03/2016 19:37   Dg Chest Port 1 View  Result Date: 09/13/2016 CLINICAL DATA:  Acute onset of shortness of breath and generalized chest pain. Initial encounter. EXAM: PORTABLE CHEST 1 VIEW COMPARISON:  Chest radiograph performed 09/03/2016 FINDINGS: The lungs are well-aerated. Mild bibasilar opacities may reflect mild pneumonia. There is no evidence of pleural effusion or pneumothorax. The cardiomediastinal silhouette is borderline normal in size. No acute osseous abnormalities are seen. IMPRESSION: Mild bibasilar airspace opacities raise concern for mild pneumonia. Electronically Signed   By: Garald Balding M.D.   On: 09/13/2016 04:42    EKG: normal sinus rhythm, T wave inversion in the anterior lead  Echo: 09/04/2016 - Left ventricle: The cavity size was normal. Systolic function was   normal. The estimated ejection fraction was in the range of 55%   to 60%. Wall motion was normal; there were no regional wall   motion abnormalities. Features are consistent with a pseudonormal   left ventricular filling pattern, with concomitant abnormal   relaxation and increased filling pressure (grade 2 diastolic   dysfunction). Doppler parameters are consistent with high   ventricular filling pressure. - Aortic valve: There was trivial regurgitation. - Pulmonary  arteries: Systolic pressure could not be accurately   estimated.  Cardiac cath: none  Medical decision making:  Discussed care with the patient Discussed care with the physician on the phone Reviewed labs and imaging personally Reviewed prior records  ASSESSMENT AND PLAN:  This is a 61 y.o. AA female without known CAD with recent accelerated angina episodes presented again with recurrent hypertensive emergency and CHF episodes as well as elevated troponin   Principal Problem:   Acute on chronic diastolic congestive heart failure, NYHA class 4 ([place]) Active Problems:   Diabetes mellitus type 2, uncontrolled, with complications (HCC)   Anemia in stage 3 chronic kidney disease   Essential hypertension   CKD (chronic kidney disease) stage 3, GFR 30-59 ml/min   OSA (obstructive sleep apnea)   Diabetic neuropathy ([place])   NSTEMI (non-ST elevated myocardial infarction) ([place])   Hypertensive emergency  Acute on chronic  diastolic CHF, NYHA class IV - triggered by HTN urgency, poor HTN control, elevated BNP Plan: strict I/Os, IV diuresis with lasix, daily weights, low sodium diet, keep potassium >4, calcium >9, magnesium >2, echocardiogram reviewed  Atypical chest pain with elevated troponin and risk factors, likely ACS rather than demand from HTN urgency, Killip class III (TIMI risk score - 3 ) Plan: cycle troponin, serial ECGs, once renal function is optimized consider cardiac catheterization, patient is NPO for tomorrow  Hypertensive emergency - 2/2 to poor medical optimization concern for medication non compliance Plan: consolidate the BP meds, consider more potent medications, continue home medications, still have room for increase in hydralazine, will increase it, ordered renal ultrasound to evaluate for renal artery stenosis as multiple admissions for CHF and HTN emergency  Chronic kidney disease CKD stage 3 b, likely HTN and DM related GFR 36 Plan: no need for acute dialysis,  electrolytes wnl, dose medications per GFR, if plan for cardiac cath patient at high risk of contrast induced nephropathy   Diabetes mellitus with nephropathy and neuropathy SSI, finger sticks, No oral hypoglycemics, HbA1c, continue gabapentin  Anemia of chronic kidney disease - stable Hb 10.0 today, will monitor   GERD - changed zantac to protonix Obstructive sleep apnea, severe by sleep study in 2016 - untreated - consider outpatient follow up with sleep specialist Chronic Hepatitis C with decompensated CLD with prior history of hepatic coma - avoid use of benzodiazepines Jehovah's witness refusal of blood products Morbid obesity Asthma, uncomplicated - on albuterol rescue inhalers, does not have advair diskus 2/2 insurance   Signed, Flossie Dibble, MD MS 09/13/2016, 7:17 AM

## 2016-09-13 NOTE — ED Notes (Signed)
Pt assisted to use bedside commode

## 2016-09-13 NOTE — Progress Notes (Signed)
ANTICOAGULATION CONSULT NOTE - Follow Up Consult  Pharmacy Consult for Heparin Indication: chest pain/ACS  Allergies  Allergen Reactions  . Shellfish Allergy Anaphylaxis and Swelling  . Diazepam Other (See Comments)    "felt like out of body experience"  . Morphine And Related Itching    Patient Measurements: Height: 5\' 8"  (172.7 cm) Weight: 292 lb 9.6 oz (132.7 kg) IBW/kg (Calculated) : 63.9 Heparin Dosing Weight:  95.7 kg  Vital Signs: Temp: 98.1 F (36.7 C) (03/04 1653) Temp Source: Oral (03/04 1653) BP: 130/58 (03/04 2132) Pulse Rate: 81 (03/04 1653)  Labs:  Recent Labs  09/13/16 0418 09/13/16 1245 09/13/16 1526 09/13/16 2103  HGB 10.0*  --   --   --   HCT 31.2*  --   --   --   PLT 247  --   --   --   HEPARINUNFRC  --  0.10*  --  <0.10*  CREATININE 1.73*  --   --   --   TROPONINI  --  0.65* 0.82*  --     Estimated Creatinine Clearance: 51.1 mL/min (by C-G formula based on SCr of 1.73 mg/dL (H)).    Assessment:  Anticoag: On heparin gtt for ACS. First heparin level low at 0.1, now <0.1 undetectable. Hgb 10.0, plts wnl. RN reports no issues with heparin other than it running concurrently with Vanco   Goal of Therapy:  Heparin level 0.3-0.7 units/ml Monitor platelets by anticoagulation protocol: Yes   Plan:  Heparin 3000 unit bolus Heparin infusion increase to 1600 units/hr Daily HL and CBC   Loron Weimer S. Alford Highland, PharmD, BCPS Clinical Staff Pharmacist Pager [phone]  Eilene Ghazi Stillinger 09/13/2016,9:51 PM

## 2016-09-13 NOTE — ED Notes (Signed)
Pt reports exertional chest pain and sob x1 week that worsened tonight when she was walking to bed. Pt was admitted recently, taken off of lasix and a "blood pressure medication" for high potassium

## 2016-09-13 NOTE — ED Notes (Signed)
Cardiology paged again. 

## 2016-09-13 NOTE — Progress Notes (Signed)
ANTICOAGULATION CONSULT NOTE - Initial Consult  Pharmacy Consult for Heparin  Indication: chest pain/ACS  Allergies  Allergen Reactions  . Shellfish Allergy Anaphylaxis and Swelling  . Diazepam Other (See Comments)    "felt like out of body experience"  . Morphine And Related Itching   Heparin Dosing Weight: 96 kg  Vital Signs: Temp: 97.4 F (36.3 C) (03/04 0421) BP: 193/82 (03/04 1230) Pulse Rate: 82 (03/04 1230)  Labs:  Recent Labs  09/13/16 0418 09/13/16 1245  HGB 10.0*  --   HCT 31.2*  --   PLT 247  --   HEPARINUNFRC  --  0.10*  CREATININE 1.73*  --     Estimated Creatinine Clearance: 51.5 mL/min (by C-G formula based on SCr of 1.73 mg/dL (H)).   Medical History: Past Medical History:  Diagnosis Date  . Asthma    As a child   . Bronchitis   . CHF (congestive heart failure) ([place]) 2011  . Complication of anesthesia   . Diabetes mellitus Dx 1989  . Hepatitis C Dx 2013  . Hypertension Dx 1989  . Obesity   . Pancreatitis 2013  . Refusal of blood transfusions as patient is Jehovah's Witness   . Ulcer ([place]) 2010   Assessment: 61 yo F on heparin gtt for ACS. First heparin level low at 0.1. Hgb 10.0, plts wnl.  Goal of Therapy:  Heparin level 0.3-0.7 units/ml Monitor platelets by anticoagulation protocol: Yes   Plan:  Increase heparin gtt to 1,300 units/hr Check 6 hr heparin level Monitor daily heparin level, CBC, s/s of bleed  Elenor Quinones, PharmD, BCPS Clinical Pharmacist Pager [phone] 09/13/2016 1:48 PM

## 2016-09-13 NOTE — ED Notes (Signed)
Pt stuck for IV x 4 by EMS, x 2 by RNs; IV team consulted

## 2016-09-13 NOTE — ED Notes (Signed)
Speaking with cardiology, they are coming to hospital.

## 2016-09-13 NOTE — ED Notes (Signed)
Pt moved into rm [id number]

## 2016-09-13 NOTE — ED Notes (Signed)
Dr Meda Coffee with Cardiology at bedside.

## 2016-09-13 NOTE — ED Provider Notes (Signed)
[facility] DEPT Provider Note   CSN: [id number] Arrival date & time: 09/13/16  0406     History   Chief Complaint Chief Complaint  Patient presents with  . Shortness of Breath    HPI [name] is a 61 y.o. female.  61 yo F with a chief complaint of shortness breath on exertion. This been going on for the past week. She was just admitted the hospital for the same and since she had been discharged and had continuing symptoms. She feels it is getting much worse. Describes some left-sided chest pain with this as well. She thinks maybe this is her pancreatitis versus her heart causing her issues. She is not sure if she has more fluid on her than normal.  EMS arrival were concerned about fluid overload with lower extremity edema and rales. She was started on BiPAP.   The history is provided by the patient.  Shortness of Breath  This is a new problem. The average episode lasts 1 week. The problem occurs continuously.The current episode started more than 1 week ago. The problem has not changed since onset.Associated symptoms include chest pain. Pertinent negatives include no fever, no headaches, no rhinorrhea, no wheezing and no vomiting. She has tried nothing for the symptoms. The treatment provided no relief. She has had prior hospitalizations. She has had prior ED visits. Associated medical issues include heart failure. Associated medical issues do not include CAD or past MI.    Past Medical History:  Diagnosis Date  . Asthma    As a child   . Bronchitis   . CHF (congestive heart failure) ([place]) 2011  . Complication of anesthesia   . Diabetes mellitus Dx 1989  . Hepatitis C Dx 2013  . Hypertension Dx 1989  . Obesity   . Pancreatitis 2013  . Refusal of blood transfusions as patient is Jehovah's Witness   . Ulcer ([place]) 2010    Patient Active Problem List   Diagnosis Date Noted  . Acute exacerbation of CHF (congestive heart failure) ([place]) 09/13/2016  . Hypertensive  urgency 09/04/2016  . Hyperkalemia 08/21/2016  . Acute left ankle pain 07/27/2016  . Injury of left hand 02/24/2016  . Asthma 11/29/2015  . Trigger middle finger 11/25/2015  . Depression 09/05/2015  . GERD (gastroesophageal reflux disease) 03/25/2015  . Environmental allergies 03/14/2015  . Hep C w/o coma, chronic ([place]) 01/31/2015  . Diabetic neuropathy ([place]) 01/31/2015  . OSA (obstructive sleep apnea) 01/01/2015  . Poor dentition 11/13/2014  . Essential hypertension 10/08/2014  . Obesity 10/08/2014  . CKD (chronic kidney disease) stage 3, GFR 30-59 ml/min 10/08/2014  . Diabetes mellitus type 2, uncontrolled, with complications ([place]) 70/26/3785    Class: Chronic  . Chronic diastolic CHF (congestive heart failure), NYHA class 2 ([place]) 03/25/2012  . Anemia in stage 3 chronic kidney disease 03/25/2012    Past Surgical History:  Procedure Laterality Date  . CHOLECYSTECTOMY    . EYE SURGERY    . KNEE ARTHROSCOPY    . TUBAL LIGATION    . TUBAL LIGATION      OB History    No data available       Home Medications    Prior to Admission medications   Medication Sig Start Date End Date Taking? Authorizing Provider  acetaminophen-codeine (TYLENOL #3) 300-30 MG tablet Take 1 tablet by mouth every 8 (eight) hours as needed. Patient taking differently: Take 1 tablet by mouth every 8 (eight) hours as needed for moderate pain.  07/27/16  Yes Josalyn Funches, MD  albuterol (PROVENTIL) (2.5 MG/3ML) 0.083% nebulizer solution Take 3 mLs (2.5 mg total) by nebulization every 6 (six) hours as needed for wheezing or shortness of breath. 03/25/15  Yes Arnoldo Morale, MD  albuterol (VENTOLIN HFA) 108 (90 Base) MCG/ACT inhaler Inhale 2 puffs into the lungs every 4 (four) hours as needed for wheezing or shortness of breath. 08/03/16  Yes Josalyn Funches, MD  aspirin 325 MG tablet Take 1 tablet (325 mg total) by mouth daily. 06/27/15  Yes Josalyn Funches, MD  atorvastatin (LIPITOR) 20 MG tablet TAKE 1  TABLET BY MOUTH DAILY 06/12/16  Yes Josalyn Funches, MD  carvedilol (COREG) 12.5 MG tablet Take 2 tablets (25 mg total) by mouth 2 (two) times daily with a meal. 09/05/16  Yes Lavina Hamman, MD  diclofenac sodium (VOLTAREN) 1 % GEL Apply 4 g topically daily as needed (for pain). 11/25/15  Yes Josalyn Funches, MD  exenatide (BYETTA 5 MCG PEN) 5 MCG/0.02ML SOPN injection INJECT 5 MCG INTO THE SKIN 2 TIMES DAILY WITH A MEAL. 08/03/16  Yes Josalyn Funches, MD  ferrous sulfate 325 (65 FE) MG tablet Take 1 tablet (325 mg total) by mouth 2 (two) times daily with a meal. 07/28/16  Yes Josalyn Funches, MD  fluticasone (FLONASE) 50 MCG/ACT nasal spray Place 2 sprays into both nostrils daily. Patient taking differently: Place 2 sprays into both nostrils daily as needed for allergies.  10/01/15  Yes Josalyn Funches, MD  gabapentin (NEURONTIN) 100 MG capsule Take 2 capsules (200 mg total) by mouth 3 (three) times daily. 09/01/16  Yes Josalyn Funches, MD  hydrALAZINE (APRESOLINE) 50 MG tablet Take 1 tablet (50 mg total) by mouth 3 (three) times daily. 09/05/16  Yes Lavina Hamman, MD  hydrOXYzine (ATARAX/VISTARIL) 50 MG tablet Take 1 tablet (50 mg total) by mouth 3 (three) times daily as needed. Patient taking differently: Take 50 mg by mouth 3 (three) times daily as needed for anxiety.  07/27/16  Yes Josalyn Funches, MD  insulin aspart (NOVOLOG) 100 UNIT/ML FlexPen Inject 10 Units into the skin 3 (three) times daily with meals. 08/03/16  Yes Josalyn Funches, MD  Insulin Glargine (LANTUS SOLOSTAR) 100 UNIT/ML Solostar Pen Inject 50 Units into the skin daily at 10 pm. Patient taking differently: Inject 50 Units into the skin every morning.  08/03/16  Yes Josalyn Funches, MD  ketotifen (ZADITOR) 0.025 % ophthalmic solution Place 1 drop into both eyes daily as needed (for dry eyes). 07/27/16  Yes Josalyn Funches, MD  ranitidine (ZANTAC) 150 MG tablet Take 1 tablet (150 mg total) by mouth 2 (two) times daily. 07/27/16  Yes Josalyn  Funches, MD  sitaGLIPtin (JANUVIA) 50 MG tablet Take 1 tablet (50 mg total) by mouth daily. 08/03/16  Yes Josalyn Funches, MD  valACYclovir (VALTREX) 500 MG tablet TAKE 1 TABLET BY MOUTH DAILY. 08/17/16  Yes Josalyn Funches, MD  Fluticasone-Salmeterol (ADVAIR DISKUS) 250-50 MCG/DOSE AEPB Inhale 1 puff into the lungs 2 (two) times daily. Patient not taking: Reported on 09/03/2016 09/01/16   Boykin Nearing, MD  isosorbide mononitrate (IMDUR) 30 MG 24 hr tablet Take 1 tablet (30 mg total) by mouth daily. Patient not taking: Reported on 09/13/2016 09/06/16   Lavina Hamman, MD    Family History Family History  Problem Relation Age of Onset  . Colon cancer Mother   . Heart attack Other   . Heart attack Maternal Grandmother   . Hypertension Sister   . Hypertension Brother   .  Diabetes Paternal Grandmother     Social History Social History  Substance Use Topics  . Smoking status: Former Smoker    Quit date: 10/25/1980  . Smokeless tobacco: Never Used     Comment: quit smoking in 1982  . Alcohol use Yes     Comment: 3 times in last year     Allergies   Shellfish allergy; Diazepam; and Morphine and related   Review of Systems Review of Systems  Constitutional: Negative for chills and fever.  HENT: Negative for congestion and rhinorrhea.   Eyes: Negative for redness and visual disturbance.  Respiratory: Positive for shortness of breath. Negative for wheezing.   Cardiovascular: Positive for chest pain. Negative for palpitations.  Gastrointestinal: Negative for nausea and vomiting.  Genitourinary: Negative for dysuria and urgency.  Musculoskeletal: Negative for arthralgias and myalgias.  Skin: Negative for pallor and wound.  Neurological: Negative for dizziness and headaches.     Physical Exam Updated Vital Signs BP 179/95   Pulse 64   Temp 97.4 F (36.3 C)   Resp 25   SpO2 98%   Physical Exam  Constitutional: She is oriented to person, place, and time. She appears  well-developed and well-nourished. No distress.  HENT:  Head: Normocephalic and atraumatic.  Eyes: EOM are normal. Pupils are equal, round, and reactive to light.  Neck: Normal range of motion. Neck supple.  Cardiovascular: Normal rate and regular rhythm.  Exam reveals no gallop and no friction rub.   No murmur heard. Pulmonary/Chest: Effort normal. She has no wheezes. She has rales (faint at bases). She exhibits no tenderness.  Abdominal: Soft. She exhibits no distension and no mass. There is no tenderness. There is no guarding.  Musculoskeletal: She exhibits edema (2+ to the knees). She exhibits no tenderness.  Neurological: She is alert and oriented to person, place, and time.  Skin: Skin is warm and dry. She is not diaphoretic.  Psychiatric: She has a normal mood and affect. Her behavior is normal.  Nursing note and vitals reviewed.    ED Treatments / Results  Labs (all labs ordered are listed, but only abnormal results are displayed) Labs Reviewed  CBC WITH DIFFERENTIAL/PLATELET - Abnormal; Notable for the following:       Result Value   RBC 3.57 (*)    Hemoglobin 10.0 (*)    HCT 31.2 (*)    All other components within normal limits  COMPREHENSIVE METABOLIC PANEL - Abnormal; Notable for the following:    Glucose, Bld 259 (*)    BUN 30 (*)    Creatinine, Ser 1.73 (*)    Calcium 8.5 (*)    Albumin 3.4 (*)    Alkaline Phosphatase 200 (*)    GFR calc non Af Amer 31 (*)    GFR calc Af Amer 36 (*)    All other components within normal limits  BRAIN NATRIURETIC PEPTIDE - Abnormal; Notable for the following:    B Natriuretic Peptide 430.9 (*)    All other components within normal limits  I-STAT TROPOININ, ED - Abnormal; Notable for the following:    Troponin i, poc 0.42 (*)    All other components within normal limits  LIPASE, BLOOD  HEPARIN LEVEL (UNFRACTIONATED)    EKG  EKG Interpretation  Date/Time:  Sunday September 13 2016 04:13:13 EST Ventricular Rate:  76 PR  Interval:    QRS Duration: 114 QT Interval:  479 QTC Calculation: 539 R Axis:   74 Text Interpretation:  Sinus rhythm Borderline intraventricular  conduction delay Abnormal T, consider ischemia, diffuse leads Prolonged QT interval new deep inverted T waves in the anterior leads Confirmed by Meshach Perry MD, DANIEL [phone]) on 09/13/2016 4:22:31 AM       Radiology Dg Chest Port 1 View  Result Date: 09/13/2016 CLINICAL DATA:  Acute onset of shortness of breath and generalized chest pain. Initial encounter. EXAM: PORTABLE CHEST 1 VIEW COMPARISON:  Chest radiograph performed 09/03/2016 FINDINGS: The lungs are well-aerated. Mild bibasilar opacities may reflect mild pneumonia. There is no evidence of pleural effusion or pneumothorax. The cardiomediastinal silhouette is borderline normal in size. No acute osseous abnormalities are seen. IMPRESSION: Mild bibasilar airspace opacities raise concern for mild pneumonia. Electronically Signed   By: Garald Balding M.D.   On: 09/13/2016 04:42    Procedures Procedures (including critical care time)  Medications Ordered in ED Medications  nitroGLYCERIN (NITROSTAT) SL tablet 0.4 mg (not administered)  heparin ADULT infusion 100 units/mL (25000 units/230mL sodium chloride 0.45%) (1,000 Units/hr Intravenous New Bag/Given 09/13/16 0508)  hydrALAZINE (APRESOLINE) tablet 50 mg (not administered)  isosorbide mononitrate (IMDUR) 24 hr tablet 30 mg (not administered)  insulin aspart (NOVOLOG) FlexPen 10 Units (not administered)  Insulin Glargine (LANTUS) Solostar Pen 50 Units (not administered)  hydrALAZINE (APRESOLINE) injection 10 mg (not administered)  insulin aspart (novoLOG) injection 0-9 Units (not administered)  insulin aspart (novoLOG) injection 0-5 Units (not administered)  aspirin chewable tablet 324 mg (324 mg Oral Given by EMS 09/13/16 0422)  heparin bolus via infusion 4,000 Units (4,000 Units Intravenous Bolus from Bag 09/13/16 0509)  furosemide (LASIX) injection  40 mg (40 mg Intravenous Given 09/13/16 8887)     Initial Impression / Assessment and Plan / ED Course  I have reviewed the triage vital signs and the nursing notes.  Pertinent labs & imaging results that were available during my care of the patient were reviewed by me and considered in my medical decision making (see chart for details).     61 yo F With a chief complaint of shortness breath on exertion. Going on for the past week. Had recently been admitted for the same. At that time she was scheduled for an outpatient stress test.   Trop positive with ecg changes concern for NSTEMI.  Will discuss with cards.   The patients results and plan were reviewed and discussed.   Any x-rays performed were independently reviewed by myself.   Differential diagnosis were considered with the presenting HPI.  Medications  nitroGLYCERIN (NITROSTAT) SL tablet 0.4 mg (not administered)  heparin ADULT infusion 100 units/mL (25000 units/290mL sodium chloride 0.45%) (1,000 Units/hr Intravenous New Bag/Given 09/13/16 0508)  hydrALAZINE (APRESOLINE) tablet 50 mg (not administered)  isosorbide mononitrate (IMDUR) 24 hr tablet 30 mg (not administered)  insulin aspart (NOVOLOG) FlexPen 10 Units (not administered)  Insulin Glargine (LANTUS) Solostar Pen 50 Units (not administered)  hydrALAZINE (APRESOLINE) injection 10 mg (not administered)  insulin aspart (novoLOG) injection 0-9 Units (not administered)  insulin aspart (novoLOG) injection 0-5 Units (not administered)  aspirin chewable tablet 324 mg (324 mg Oral Given by EMS 09/13/16 0422)  heparin bolus via infusion 4,000 Units (4,000 Units Intravenous Bolus from Bag 09/13/16 0509)  furosemide (LASIX) injection 40 mg (40 mg Intravenous Given 09/13/16 0652)    Vitals:   09/13/16 0545 09/13/16 0615 09/13/16 0630 09/13/16 0645  BP: 200/78 170/86 190/96 179/95  Pulse: 70 64 67 64  Resp: (!) 31 (!) 28 22 25   Temp:      SpO2: 100% 99% 99% 98%  Final diagnoses:   NSTEMI (non-ST elevated myocardial infarction) [facility])    Admission/ observation were discussed with the admitting physician, patient and/or family and they are comfortable with the plan.    Final Clinical Impressions(s) / ED Diagnoses   Final diagnoses:  NSTEMI (non-ST elevated myocardial infarction) [facility])    New Prescriptions New Prescriptions   No medications on file     Deno Etienne, DO 09/13/16 1594

## 2016-09-13 NOTE — ED Notes (Signed)
Pt on 2L [place], sats 89-90%; oxygen increased to 3L, sats 95%

## 2016-09-13 NOTE — Progress Notes (Addendum)
Progress Note  Patient Name: [name] Date of Encounter: 09/13/2016  Primary Cardiologist: Dr Burt Knack  Subjective   The patient continues to have chest pain on inspiration, she is in teras, she states that she woke up with fever/chills/productive sputum - green. The pain feels dull.   Inpatient Medications    Scheduled Meds: . aspirin EC  81 mg Oral Daily  . atorvastatin  80 mg Oral q1800  . carvedilol  25 mg Oral BID WC  . furosemide  40 mg Intravenous Q6H  . gabapentin  200 mg Oral TID  . hydrALAZINE  50 mg Oral TID  . insulin aspart  0-5 Units Subcutaneous QHS  . insulin aspart  0-9 Units Subcutaneous TID WC  . insulin aspart  10 Units Subcutaneous TID WC  . insulin glargine  50 Units Subcutaneous Daily  . isosorbide mononitrate  30 mg Oral Daily  . pantoprazole (PROTONIX) IV  40 mg Intravenous Q24H  . sodium chloride flush  3 mL Intravenous Q12H  . valACYclovir  500 mg Oral Daily   Continuous Infusions: . heparin 1,000 Units/hr (09/13/16 0508)   PRN Meds: albuterol, hydrALAZINE, ketotifen   Vital Signs    Vitals:   09/13/16 1145 09/13/16 1200 09/13/16 1215 09/13/16 1230  BP: 164/68 155/71 147/85 193/82  Pulse: 85 83 81 82  Resp: 18 14 20 16   Temp:      SpO2: 94% 94% 98% 96%   No intake or output data in the 24 hours ending 09/13/16 1339 There were no vitals filed for this visit.  Telemetry    SR - Personally Reviewed  ECG    SR, new diffuse negative T waves - Personally Reviewed  Physical Exam  The patient in mild distress sec to SOB and chest pain GEN: No acute distress.   Neck: No JVD Cardiac: RRR, no murmurs, + rubs, or gallops.  Respiratory: Crackles bilaterally. GI: Soft, nontender, non-distended  MS: No edema; No deformity. Neuro:  Nonfocal  Psych: Normal affect   Labs    Chemistry Recent Labs Lab 09/13/16 0418  NA 139  K 4.1  CL 107  CO2 23  GLUCOSE 259*  BUN 30*  CREATININE 1.73*  CALCIUM 8.5*  PROT 6.8  ALBUMIN 3.4*    AST 27  ALT 36  ALKPHOS 200*  BILITOT 0.6  GFRNONAA 31*  GFRAA 36*  ANIONGAP 9     Hematology Recent Labs Lab 09/13/16 0418  WBC 9.6  RBC 3.57*  HGB 10.0*  HCT 31.2*  MCV 87.4  MCH 28.0  MCHC 32.1  RDW 14.2  PLT 247    Cardiac EnzymesNo results for input(s): TROPONINI in the last 168 hours.  Recent Labs Lab 09/13/16 0421  TROPIPOC 0.42*     BNP Recent Labs Lab 09/13/16 0418  BNP 430.9*     DDimer No results for input(s): DDIMER in the last 168 hours.   Radiology    Dg Chest Port 1 View  Result Date: 09/13/2016 CLINICAL DATA:  Acute onset of shortness of breath and generalized chest pain. Initial encounter. EXAM: PORTABLE CHEST 1 VIEW COMPARISON:  Chest radiograph performed 09/03/2016 FINDINGS: The lungs are well-aerated. Mild bibasilar opacities may reflect mild pneumonia. There is no evidence of pleural effusion or pneumothorax. The cardiomediastinal silhouette is borderline normal in size. No acute osseous abnormalities are seen. IMPRESSION: Mild bibasilar airspace opacities raise concern for mild pneumonia. Electronically Signed   By: Garald Balding M.D.   On: 09/13/2016 04:42  Cardiac Studies   TTE: 09/04/2016 Left ventricle: The cavity size was normal. Systolic function was   normal. The estimated ejection fraction was in the range of 55%   to 60%. Wall motion was normal; there were no regional wall   motion abnormalities. Features are consistent with a pseudonormal   left ventricular filling pattern, with concomitant abnormal   relaxation and increased filling pressure (grade 2 diastolic   dysfunction). Doppler parameters are consistent with high   ventricular filling pressure. - Aortic valve: There was trivial regurgitation. - Pulmonary arteries: Systolic pressure could not be accurately   estimated.   Patient Profile     61 y.o. female  AA female without known CAD with recent accelerated angina episodes presented again with recurrent  hypertensive emergency and CHF episodes as well as elevated troponin   Assessment & Plan     Principal Problem:   Acute on chronic diastolic congestive heart failure, NYHA class 4 (HCC) Active Problems:   Diabetes mellitus type 2, uncontrolled, with complications (HCC)   Anemia in stage 3 chronic kidney disease   Essential hypertension   CKD (chronic kidney disease) stage 3, GFR 30-59 ml/min   OSA (obstructive sleep apnea)   Diabetic neuropathy ([place])   NSTEMI (non-ST elevated myocardial infarction) ([place])   Hypertensive emergency  1. Atypical chest pain - on inspiration with positive rub on physical exam, suspicion for pneumonia on CXR, highly suspicious for an acute myopericarditis, we will start treatment with colchicine and morphine for pain, avoid NSAIDS as she has CKD stage 3. This time she has diffuse T wave inversion not present on 09/04/2016 ECG. Also elevated troponin, she has been started on Heparin drip for ACS, we will continue iv Heparin and repeat troponin. She is a poor cath candidate with CKD stage 3. The patient was supposed to undergo an outpatient stress test, however not done yet. We will decide on further workup depending on troponin elevation. For now repeat echocardiogram for evaluation of new regional wall motion abnormalities and pericardial effusion.  2. Hypertensive emergency - we will start iv NTG for BP control.   3. Acute on chronic diastolic CHF, BNP 366, NYHA class IV - triggered by HTN emergency, continue lasix 40 mg iv Q6H.  4. Pneumonia - start vancomycin/zosyn, test for influenza  5. Chronic kidney disease CKD stage 3 b, likely HTN and DM related GFR 36 Plan: no need for acute dialysis, electrolytes wnl, dose medications per GFR, if plan for cardiac cath patient at high risk of contrast induced nephropathy   6. Diabetes mellitus with nephropathy and neuropathy SSI, finger sticks, No oral hypoglycemics, HbA1c, continue gabapentin  7. Anemia of  chronic kidney disease - stable Hb 10.0 today, will monitor   The patient was seen by a fellow on call this am at 6: 55 am, however developed recurrent chest pain and complex decision making, total time spent with the patient was 1 hour.   Signed, Ena Dawley, MD  09/13/2016, 1:39 PM

## 2016-09-13 NOTE — ED Notes (Signed)
Pt c/o increasing sob, pt hypertensive, RT at bedside, lung diminished

## 2016-09-13 NOTE — ED Notes (Signed)
Family is very upset.  They feel that in previous visits pt has been discharged before she is well.  They also are upset that pt was taken off of blood pressure meds and lasix - RN explained some of the reasons why bp meds would have been d/c;d but that ultimately family would need to discuss this with MD.

## 2016-09-14 ENCOUNTER — Inpatient Hospital Stay (HOSPITAL_COMMUNITY): Payer: Medicaid Other

## 2016-09-14 ENCOUNTER — Ambulatory Visit: Payer: Medicaid Other | Admitting: Family Medicine

## 2016-09-14 DIAGNOSIS — I5041 Acute combined systolic (congestive) and diastolic (congestive) heart failure: Secondary | ICD-10-CM

## 2016-09-14 DIAGNOSIS — I5031 Acute diastolic (congestive) heart failure: Secondary | ICD-10-CM

## 2016-09-14 DIAGNOSIS — I213 ST elevation (STEMI) myocardial infarction of unspecified site: Secondary | ICD-10-CM

## 2016-09-14 DIAGNOSIS — E118 Type 2 diabetes mellitus with unspecified complications: Secondary | ICD-10-CM

## 2016-09-14 DIAGNOSIS — I160 Hypertensive urgency: Secondary | ICD-10-CM

## 2016-09-14 LAB — COMPREHENSIVE METABOLIC PANEL
ALT: 25 U/L (ref 14–54)
AST: 18 U/L (ref 15–41)
Albumin: 2.9 g/dL — ABNORMAL LOW (ref 3.5–5.0)
Alkaline Phosphatase: 152 U/L — ABNORMAL HIGH (ref 38–126)
Anion gap: 7 (ref 5–15)
BUN: 34 mg/dL — ABNORMAL HIGH (ref 6–20)
CO2: 24 mmol/L (ref 22–32)
Calcium: 8.1 mg/dL — ABNORMAL LOW (ref 8.9–10.3)
Chloride: 106 mmol/L (ref 101–111)
Creatinine, Ser: 2.37 mg/dL — ABNORMAL HIGH (ref 0.44–1.00)
GFR calc Af Amer: 25 mL/min — ABNORMAL LOW (ref >60)
GFR calc non Af Amer: 21 mL/min — ABNORMAL LOW (ref >60)
Glucose, Bld: 252 mg/dL — ABNORMAL HIGH (ref 65–99)
Potassium: 4 mmol/L (ref 3.5–5.1)
Sodium: 137 mmol/L (ref 135–145)
Total Bilirubin: 0.8 mg/dL (ref 0.3–1.2)
Total Protein: 6.3 g/dL — ABNORMAL LOW (ref 6.5–8.1)

## 2016-09-14 LAB — TROPONIN I: Troponin I: 0.88 ng/mL (ref <0.03)

## 2016-09-14 LAB — CBC
HCT: 29.1 % — ABNORMAL LOW (ref 36.0–46.0)
Hemoglobin: 9.1 g/dL — ABNORMAL LOW (ref 12.0–15.0)
MCH: 27.4 pg (ref 26.0–34.0)
MCHC: 31.3 g/dL (ref 30.0–36.0)
MCV: 87.7 fL (ref 78.0–100.0)
Platelets: 258 10*3/uL (ref 150–400)
RBC: 3.32 MIL/uL — ABNORMAL LOW (ref 3.87–5.11)
RDW: 14.4 % (ref 11.5–15.5)
WBC: 10.2 10*3/uL (ref 4.0–10.5)

## 2016-09-14 LAB — PROTIME-INR
INR: 1.09
Prothrombin Time: 14.1 seconds (ref 11.4–15.2)

## 2016-09-14 LAB — ECHOCARDIOGRAM COMPLETE
Height: 68 in
Weight: 4764.8 oz

## 2016-09-14 LAB — GLUCOSE, CAPILLARY
Glucose-Capillary: 172 mg/dL — ABNORMAL HIGH (ref 65–99)
Glucose-Capillary: 108 mg/dL — ABNORMAL HIGH (ref 65–99)
Glucose-Capillary: 186 mg/dL — ABNORMAL HIGH (ref 65–99)
Glucose-Capillary: 119 mg/dL — ABNORMAL HIGH (ref 65–99)

## 2016-09-14 LAB — HEMOGLOBIN A1C
Hgb A1c MFr Bld: 8 % — ABNORMAL HIGH (ref 4.8–5.6)
Mean Plasma Glucose: 183 mg/dL

## 2016-09-14 LAB — PROCALCITONIN: Procalcitonin: <0.1 ng/mL

## 2016-09-14 LAB — HEPARIN LEVEL (UNFRACTIONATED)
Heparin Unfractionated: 0.45 IU/mL (ref 0.30–0.70)
Heparin Unfractionated: 0.46 IU/mL (ref 0.30–0.70)

## 2016-09-14 MED ORDER — TRAMADOL HCL 50 MG PO TABS
50.0000 mg | ORAL_TABLET | Freq: Four times a day (QID) | ORAL | Status: DC | PRN
  Administered 2016-09-18 – 2016-09-19 (×2): 50 mg via ORAL
  Filled 2016-09-14 (×2): qty 1

## 2016-09-14 MED ORDER — PERFLUTREN LIPID MICROSPHERE
1.0000 mL | INTRAVENOUS | Status: AC | PRN
Start: 2016-09-14 — End: 2016-09-14
  Filled 2016-09-14: qty 10

## 2016-09-14 MED ORDER — GUAIFENESIN ER 600 MG PO TB12
1200.0000 mg | ORAL_TABLET | Freq: Two times a day (BID) | ORAL | Status: DC
  Administered 2016-09-14 – 2016-09-19 (×11): 1200 mg via ORAL
  Filled 2016-09-14 (×11): qty 2

## 2016-09-14 MED ORDER — AMLODIPINE BESYLATE 5 MG PO TABS
5.0000 mg | ORAL_TABLET | Freq: Every day | ORAL | Status: DC
  Administered 2016-09-15 – 2016-09-19 (×5): 5 mg via ORAL
  Filled 2016-09-14 (×5): qty 1

## 2016-09-14 MED ORDER — PERFLUTREN LIPID MICROSPHERE
INTRAVENOUS | Status: AC
  Administered 2016-09-14: 2 mL
  Filled 2016-09-14: qty 10

## 2016-09-14 NOTE — Progress Notes (Signed)
Report received in patient's room via [place] RN using [employer], reviewed meds, VS, tests and general condition, assumed care of patient.

## 2016-09-14 NOTE — Progress Notes (Signed)
Medical Consultation   [name]  ZSW:[id number]  DOB: June 21, 1956  DOA: 09/13/2016  PCP: Minerva Ends, MD   Requesting physician: Burt Knack, MD  Reason for consultation: Abnormal chest XRay and pleuritic chest pain   History of Present Illness:  [name] is a 61 y.o. female with medical history significant for CHF, HTN, IDDM. untreated OSA, anemia was admitted to the hospital on 09/13/2016 by cardiology service with c/o  chest pain and SOB. Patient was treated with IV diuresis that was discontinued d/t Acute exacerbation of CKD Patient had elevated troponin that is thought to be secondary to demand ischemia   Patient had chest XRay to evaluate a pleuritic component of her chest pain and productive cough. XRay revealed mild bibasilar opacities concerning for PNA. Patient was placed on IV Zosyn and Vancomycin and we were asked to see her in consultation to help with management of PNA  Review of Systems:  ROS: patient still has pleuritic chest pain although improved and her dyspnea is better as well. She feels sleepy d/t not being able to sleep wellnearly the whole week d/t orthopnea and productive cough. Denied nausea, abdominal pain, dysuria, myalgia As per HPI otherwise 10 point review of systems negative.     Past Medical History: Past Medical History:  Diagnosis Date  . Asthma    As a child   . Bronchitis   . CHF (congestive heart failure) ([place]) 2011  . Complication of anesthesia   . Diabetes mellitus Dx 1989  . Hepatitis C Dx 2013  . Hypertension Dx 1989  . Obesity   . Pancreatitis 2013  . Refusal of blood transfusions as patient is Jehovah's Witness   . Ulcer ([place]) 2010    Past Surgical History: Past Surgical History:  Procedure Laterality Date  . CHOLECYSTECTOMY    . EYE SURGERY    . KNEE ARTHROSCOPY    . TUBAL LIGATION    . TUBAL LIGATION       Allergies:   Allergies  Allergen Reactions  . Shellfish Allergy Anaphylaxis and  Swelling  . Diazepam Other (See Comments)    "felt like out of body experience"  . Morphine And Related Itching     Social History:  reports that she quit smoking about 35 years ago. She has never used smokeless tobacco. She reports that she drinks alcohol. She reports that she does not use drugs.   Family History: Family History  Problem Relation Age of Onset  . Colon cancer Mother   . Heart attack Other   . Heart attack Maternal Grandmother   . Hypertension Sister   . Hypertension Brother   . Diabetes Paternal Grandmother      Physical Exam: Vitals:   09/14/16 0030 09/14/16 0100 09/14/16 0403 09/14/16 0742  BP: (!) 111/58 105/60  (!) 145/75  Pulse:   71 72  Resp:    18  Temp:   99.1 F (37.3 C) 99.1 F (37.3 C)  TempSrc:   Oral Oral  SpO2: 94% 94% 91% 94%  Weight:   135.1 kg (297 lb 12.8 oz)   Height:        Constitutional: Very somnolent, opens her eyes when called by name, but drifts off during the conversation oriented x3, not in any acute distress. Eyes: PERLA, EOMI, irises appear normal, anicteric sclera,  ENMT: external ears and nose appear normal, hearing is normal  Lips appears normal, oropharynx mucosa, tongue, posterior pharynx appear normal  Neck: neck appears normal, no masses, normal ROM, no thyromegaly, no JVD  CVS: S1-S2 clear, no murmur rubs or gallops, no LE edema, normal pedal pulses  Respiratory: bilaterally basilar crackles, no wheezing, rales or rhonchi. Respiratory effort normal. No accessory muscle use.  Abdomen: soft nontender, nondistended, normal bowel sounds, no hepatosplenomegaly, no hernias  Musculoskeletal: : no cyanosis, clubbing or edema noted bilaterally                      Neuro: Cranial nerves II-XII intact, strength, sensation, reflexes Psych: judgement and insight appear normal, stable mood and affect, mental status Skin: no rashes or lesions or ulcers, no induration or nodules   Data reviewed:  I have personally reviewed  following labs and imaging studies  Labs:  CBC:  Recent Labs Lab 09/13/16 0418 09/14/16 0249  WBC 9.6 10.2  NEUTROABS 6.5  --   HGB 10.0* 9.1*  HCT 31.2* 29.1*  MCV 87.4 87.7  PLT 247 678    Basic Metabolic Panel:  Recent Labs Lab 09/13/16 0418 09/14/16 0249  NA 139 137  K 4.1 4.0  CL 107 106  CO2 23 24  GLUCOSE 259* 252*  BUN 30* 34*  CREATININE 1.73* 2.37*  CALCIUM 8.5* 8.1*   GFR Estimated Creatinine Clearance: 37.7 mL/min (by C-G formula based on SCr of 2.37 mg/dL (H)). Liver Function Tests:  Recent Labs Lab 09/13/16 0418 09/14/16 0249  AST 27 18  ALT 36 25  ALKPHOS 200* 152*  BILITOT 0.6 0.8  PROT 6.8 6.3*  ALBUMIN 3.4* 2.9*    Recent Labs Lab 09/13/16 0418  LIPASE 20    Coagulation profile  Recent Labs Lab 09/14/16 0249  INR 1.09    Cardiac Enzymes:  Recent Labs Lab 09/13/16 1245 09/13/16 1526 09/13/16 2103 09/14/16 0249  TROPONINI 0.65* 0.82* 0.86* 0.88*   BNP: Invalid input(s): POCBNP CBG:  Recent Labs Lab 09/13/16 1338 09/13/16 1548 09/13/16 1716 09/13/16 2134 09/14/16 0846  GLUCAP 215* 176* 154* 201* 172*     Recent Labs  09/13/16 1245  HGBA1C 8.0*   Lipid Profile  Recent Labs  09/13/16 1245  CHOL 122  HDL 36*  LDLCALC 52  TRIG 168*  CHOLHDL 3.4   Thyroid function studies  Recent Labs  09/13/16 1245  TSH 1.860   Urinalysis    Component Value Date/Time   COLORURINE YELLOW 08/22/2016 [place] 08/22/2016 0557   LABSPEC 1.010 08/22/2016 0557   PHURINE 5.0 08/22/2016 0557   GLUCOSEU NEGATIVE 08/22/2016 0557   HGBUR NEGATIVE 08/22/2016 0557   BILIRUBINUR NEGATIVE 08/22/2016 0557   BILIRUBINUR neg 04/29/2015 1029   KETONESUR NEGATIVE 08/22/2016 0557   PROTEINUR 30 (A) 08/22/2016 0557   UROBILINOGEN 0.2 04/29/2015 1029   UROBILINOGEN 0.2 05/29/2010 2100   NITRITE NEGATIVE 08/22/2016 0557   LEUKOCYTESUR NEGATIVE 08/22/2016 0557     Microbiology Recent Results (from the  past 240 hour(s))  MRSA PCR Screening     Status: None   Collection Time: 09/13/16  6:18 PM  Result Value Ref Range Status   MRSA by PCR NEGATIVE NEGATIVE Final    Comment:        The GeneXpert MRSA Assay (FDA approved for NASAL specimens only), is one component of a comprehensive MRSA colonization surveillance program. It is not intended to diagnose MRSA infection nor to guide or monitor treatment for MRSA infections.  Inpatient Medications:   Scheduled Meds: . [START ON 09/15/2016] amLODipine  5 mg Oral Daily  . aspirin EC  81 mg Oral Daily  . atorvastatin  80 mg Oral q1800  . carvedilol  25 mg Oral BID WC  . colchicine  0.6 mg Oral BID  . gabapentin  200 mg Oral TID  . hydrALAZINE  50 mg Oral TID  . insulin aspart  0-5 Units Subcutaneous QHS  . insulin aspart  0-9 Units Subcutaneous TID WC  . insulin aspart  10 Units Subcutaneous TID WC  . insulin glargine  50 Units Subcutaneous Daily  . pantoprazole (PROTONIX) IV  40 mg Intravenous Q24H  . piperacillin-tazobactam (ZOSYN)  IV  3.375 g Intravenous Q8H  . sodium chloride flush  3 mL Intravenous Q12H  . valACYclovir  500 mg Oral Daily  . vancomycin  1,500 mg Intravenous Q24H   Continuous Infusions: . heparin 1,600 Units/hr (09/13/16 2306)  . nitroGLYCERIN 10 mcg/min (09/13/16 2200)     Radiological Exams on Admission: Dg Chest Port 1 View  Result Date: 09/13/2016 CLINICAL DATA:  Acute onset of shortness of breath and generalized chest pain. Initial encounter. EXAM: PORTABLE CHEST 1 VIEW COMPARISON:  Chest radiograph performed 09/03/2016 FINDINGS: The lungs are well-aerated. Mild bibasilar opacities may reflect mild pneumonia. There is no evidence of pleural effusion or pneumothorax. The cardiomediastinal silhouette is borderline normal in size. No acute osseous abnormalities are seen. IMPRESSION: Mild bibasilar airspace opacities raise concern for mild pneumonia. Electronically Signed   By: Garald Balding M.D.    On: 09/13/2016 04:42    Impression/Recommendations Principal Problem:   Acute on chronic diastolic congestive heart failure, NYHA class 4 (HCC) Active Problems:   Diabetes mellitus type 2, uncontrolled, with complications (HCC)   Anemia in stage 3 chronic kidney disease   Essential hypertension   CKD (chronic kidney disease) stage 3, GFR 30-59 ml/min   OSA (obstructive sleep apnea)   Diabetic neuropathy (HCC)   NSTEMI (non-ST elevated myocardial infarction) [facility])   Hypertensive emergency   HCAP - patient was recently admitted to the [facility] that warrants Vanc and Zosyn IV at renal doses Obtain Procalcitonin, respiratory culture, monitor WBC's count and fevers Add expectorant, continue nebulizer therapy and supplemental O2 Repeat chest XRay in 24-48 hours  AKI superimposed on CKD stage III Diuretic is on hold, avoid now ACEI/ARB, NSAID's, adjust doses to renal when indicated Patient was recently diagnosed with another AKI in February 018, base line creatinine 1.50-1.80 Continue to monitor renal indices  OSA - not treated, which could be causing her hypersomnolence Will add CPAP at night per respiratory.  Diabetes mellitus Continue current insulin regimen, monitor CBG's and maintain carb modified diet  Anemia The patient is Jehovah's Witness  Continue to mnitor  Thank you for this consultation.  Our [facility] hospitalist team will follow the patient with you.  Time Spent: 60 minutes  York Grice M.D. [facility] 09/14/2016, 11:08 AM

## 2016-09-14 NOTE — Progress Notes (Signed)
  Echocardiogram 2D Echocardiogram with Definity has been performed.  Tresa Res 09/14/2016, 12:08 PM

## 2016-09-14 NOTE — Progress Notes (Signed)
    Subjective:  Still having chest pain. Complains of pleuritic lower left-sided chest and epigastric pain.  Objective:  Vital Signs in the last 24 hours: Temp:  [98.1 F (36.7 C)-99.1 F (37.3 C)] 99.1 F (37.3 C) (03/05 0742) Pulse Rate:  [71-96] 72 (03/05 0742) Resp:  [14-27] 18 (03/05 0742) BP: (100-198)/(48-130) 145/75 (03/05 0742) SpO2:  [91 %-99 %] 94 % (03/05 0742) Weight:  [292 lb 9.6 oz (132.7 kg)-297 lb 12.8 oz (135.1 kg)] 297 lb 12.8 oz (135.1 kg) (03/05 0403)  Intake/Output from previous day: 03/04 0701 - 03/05 0700 In: 1070 [P.O.:220; I.V.:250; IV Piggyback:600] Out: 2450 [Urine:2450]  Physical Exam: Pt is alert and oriented, pleasant obese woman in NAD HEENT: normal Neck: JVP - normal Lungs: Bilateral rhonchi CV: RRR without murmur or gallop Abd: soft, NT, Positive BS, no hepatomegaly Ext: Trace edema, distal pulses intact and equal Skin: warm/dry no rash   Lab Results:  Recent Labs  09/13/16 0418 09/14/16 0249  WBC 9.6 10.2  HGB 10.0* 9.1*  PLT 247 258    Recent Labs  09/13/16 0418 09/14/16 0249  NA 139 137  K 4.1 4.0  CL 107 106  CO2 23 24  GLUCOSE 259* 252*  BUN 30* 34*  CREATININE 1.73* 2.37*    Recent Labs  09/13/16 2103 09/14/16 0249  TROPONINI 0.86* 0.88*    Cardiac Studies: 2-D echocardiogram 09/04/2016: Study Conclusions  - Left ventricle: The cavity size was normal. Systolic function was   normal. The estimated ejection fraction was in the range of 55%   to 60%. Wall motion was normal; there were no regional wall   motion abnormalities. Features are consistent with a pseudonormal   left ventricular filling pattern, with concomitant abnormal   relaxation and increased filling pressure (grade 2 diastolic   dysfunction). Doppler parameters are consistent with high   ventricular filling pressure. - Aortic valve: There was trivial regurgitation. - Pulmonary arteries: Systolic pressure could not be accurately  estimated.  Tele: Personally reviewed: Normal sinus rhythm  Assessment/Plan:  1. Pleuritic chest pain: Uncertain etiology but possible that demand ischemia is playing a role in the setting of marked hypertension. Also noted to have some findings suggestive of pneumonia on chest x-[place]. The patient complains of a cough over the last week productive of sputum as well as a mild leukocytosis. I'm going to ask the hospitalist service to evaluate her to help with antibiotic therapy. Currently she is on Zosyn.  2. Elevated troponin: Flat trend, in context of acute kidney injury and severe hypertension. I think this is most likely demand ischemia. Will manage her medically. She really is not a good candidate for cardiac catheterization in the setting of her acute on chronic kidney disease. Fortunately she has normal left ventricular function without regional wall motion abnormality. Anticipate an outpatient 2 day nuclear scan because of her body habitus. Will continue heparin another 24 hours.  3. Diabetes: Blood glucoses ranging from 154-201. Continue sliding scale insulin.  4. Acute on chronic kidney injury: Increase amlodipine to 5 mg. Continue carvedilol at goal dose. Continue hydralazine. Avoid ACE/ARB in the setting of acute kidney injury. IV Lasix is discontinued.  5. Anemia of chronic disease: Hemoglobin is stable. No evidence of active bleeding.  Sherren Mocha, M.D. 09/14/2016, 10:27 AM

## 2016-09-14 NOTE — Progress Notes (Addendum)
*  PRELIMINARY RESULTS* Vascular Ultrasound Renal Artery Duplex has been completed.   Findings suggest 1-59% right renal artery stenosis. Unable to adequately assess the left renal artery due to patient body habitus.  09/14/2016 9:25 AM Maudry Mayhew, BS, RVT, RDCS, RDMS

## 2016-09-14 NOTE — Progress Notes (Signed)
Pt has nodule on upper chest, states it hurts when she presses on it. RN notified Hospitalist team, they stated they will round and assess it tomorrow.

## 2016-09-14 NOTE — Progress Notes (Addendum)
ANTICOAGULATION CONSULT NOTE - Follow Up Consult  Pharmacy Consult for Heparin Indication: chest pain/ACS  Allergies  Allergen Reactions  . Shellfish Allergy Anaphylaxis and Swelling  . Diazepam Other (See Comments)    "felt like out of body experience"  . Morphine And Related Itching    Patient Measurements: Height: 5\' 8"  (172.7 cm) Weight: 297 lb 12.8 oz (135.1 kg) IBW/kg (Calculated) : 63.9 Heparin Dosing Weight:  95.7 kg  Vital Signs: Temp: 99.1 F (37.3 C) (03/05 0742) Temp Source: Oral (03/05 0742) BP: 145/75 (03/05 0742) Pulse Rate: 72 (03/05 0742)  Labs:  Recent Labs  09/13/16 0418  09/13/16 1245 09/13/16 1526 09/13/16 2103 09/14/16 0249  HGB 10.0*  --   --   --   --  9.1*  HCT 31.2*  --   --   --   --  29.1*  PLT 247  --   --   --   --  258  LABPROT  --   --   --   --   --  14.1  INR  --   --   --   --   --  1.09  HEPARINUNFRC  --   --  0.10*  --  <0.10* 0.45  CREATININE 1.73*  --   --   --   --  2.37*  TROPONINI  --   < > 0.65* 0.82* 0.86* 0.88*  < > = values in this interval not displayed.  Estimated Creatinine Clearance: 37.7 mL/min (by C-G formula based on SCr of 2.37 mg/dL (H)).    Assessment:  Anticoag: On heparin gtt for ACS. Heparin level at goal this morning x2 checks. Hgb down to 9.1, plts wnl. RN reports no issues with heparin.  Goal of Therapy:  Heparin level 0.3-0.7 units/ml Monitor platelets by anticoagulation protocol: Yes   Plan:  Heparin infusion at 1600 units/hr Daily HL and CBC  Erin Hearing PharmD., BCPS Clinical Pharmacist Pager [phone] 09/14/2016 10:37 AM

## 2016-09-14 NOTE — Plan of Care (Signed)
Problem: Education: Goal: Knowledge of [facility] Education information/materials will improve Outcome: Progressing Reviewed and gave handouts regarding CHF, chest pain, new meds (Heparin, NTG and Lasix) and answered questions, will continue to instruct and answer questions as pt begins to not feel so overwhelmed.

## 2016-09-14 NOTE — Hospital Discharge Follow-Up (Signed)
The patient has been followed at the [facility]. This CM attempted to meet with the patient today to discuss plans for follow up and the option of the [facility] ([facility])  at [facility].  She was agreeable to follow up at the [facility] and an appointment was scheduled for 09/21/16 @ 1400.   The patient was very sleepy and  deferred questions to her daughter who was with her but her daughter suggested that this CM meet with the patient tomorrow.  Will continue to follow her hospital course.

## 2016-09-14 NOTE — Plan of Care (Signed)
Problem: Education: Goal: Knowledge of [facility] Education information/materials will improve Outcome: Progressing Instructed patient and family on CHF and the importance of staying on a low sodium diet and to watch her fluid intake for now, explained about a BNP, what it is and the importance of watching her intake/outpt and calling if she needs to void so that we can keep a strict record, patient and family verbalized understanding. Family stated that they needed any medical terminology to be broken down to laymen's terms so that they could understand how to help her in her care to become healthier, several written instructions given and reviewed but they will need to hear it several times and questions asked and answered to see how well they are comprehending, will continue to monitor.

## 2016-09-14 NOTE — Progress Notes (Signed)
Patient C/O left sided chest pain radiating to her left shoulder and into her back, dull pressure pain rated 9/10, offered NTG S/L but patient refused D/T the symptoms of headache and burning under her tongue, will get and EKG and give her 2 mg of Morphine slow IVP and continue to monitor. Dr Kenton Kingfisher called about another patient and was notified of the events of this patient and the EKG, he will see her when he comes up to see other patient, awaiting further ordert. Patient resting quietly at this time.

## 2016-09-15 ENCOUNTER — Ambulatory Visit: Payer: Medicaid Other | Admitting: Family Medicine

## 2016-09-15 DIAGNOSIS — R072 Precordial pain: Secondary | ICD-10-CM

## 2016-09-15 DIAGNOSIS — R7989 Other specified abnormal findings of blood chemistry: Secondary | ICD-10-CM

## 2016-09-15 DIAGNOSIS — R778 Other specified abnormalities of plasma proteins: Secondary | ICD-10-CM

## 2016-09-15 LAB — BASIC METABOLIC PANEL
Anion gap: 9 (ref 5–15)
BUN: 37 mg/dL — ABNORMAL HIGH (ref 6–20)
CO2: 22 mmol/L (ref 22–32)
Calcium: 7.8 mg/dL — ABNORMAL LOW (ref 8.9–10.3)
Chloride: 106 mmol/L (ref 101–111)
Creatinine, Ser: 2.27 mg/dL — ABNORMAL HIGH (ref 0.44–1.00)
GFR calc Af Amer: 26 mL/min — ABNORMAL LOW (ref >60)
GFR calc non Af Amer: 23 mL/min — ABNORMAL LOW (ref >60)
Glucose, Bld: 149 mg/dL — ABNORMAL HIGH (ref 65–99)
Potassium: 4.1 mmol/L (ref 3.5–5.1)
Sodium: 137 mmol/L (ref 135–145)

## 2016-09-15 LAB — CBC WITH DIFFERENTIAL/PLATELET
Basophils Absolute: 0 10*3/uL (ref 0.0–0.1)
Basophils Relative: 0 %
Eosinophils Absolute: 0.3 10*3/uL (ref 0.0–0.7)
Eosinophils Relative: 4 %
HCT: 28.4 % — ABNORMAL LOW (ref 36.0–46.0)
Hemoglobin: 9 g/dL — ABNORMAL LOW (ref 12.0–15.0)
Lymphocytes Relative: 34 %
Lymphs Abs: 2.7 10*3/uL (ref 0.7–4.0)
MCH: 28.1 pg (ref 26.0–34.0)
MCHC: 31.7 g/dL (ref 30.0–36.0)
MCV: 88.8 fL (ref 78.0–100.0)
Monocytes Absolute: 0.6 10*3/uL (ref 0.1–1.0)
Monocytes Relative: 8 %
Neutro Abs: 4.4 10*3/uL (ref 1.7–7.7)
Neutrophils Relative %: 54 %
Platelets: 231 10*3/uL (ref 150–400)
RBC: 3.2 MIL/uL — ABNORMAL LOW (ref 3.87–5.11)
RDW: 14.9 % (ref 11.5–15.5)
WBC: 8.1 10*3/uL (ref 4.0–10.5)

## 2016-09-15 LAB — GLUCOSE, CAPILLARY
Glucose-Capillary: 201 mg/dL — ABNORMAL HIGH (ref 65–99)
Glucose-Capillary: 148 mg/dL — ABNORMAL HIGH (ref 65–99)
Glucose-Capillary: 128 mg/dL — ABNORMAL HIGH (ref 65–99)
Glucose-Capillary: 165 mg/dL — ABNORMAL HIGH (ref 65–99)

## 2016-09-15 LAB — HEPARIN LEVEL (UNFRACTIONATED): Heparin Unfractionated: 0.59 IU/mL (ref 0.30–0.70)

## 2016-09-15 NOTE — Progress Notes (Signed)
Pt asked to ambulate in the hallway for a little bit as she had already been able to do so in the room independently. Order was for ambulate as tolerated. Pt got as far as the nurses station and became SOB, dizzy and started to feel chest tightness. Pt was wheeled back to room. VSS. O2 sats 99% on RA. Pt breathing had returned to normal but the chest pain was still present. 2mg  of IV morphine administered. Will continue to monitor.  Jacqlyn Larsen, RN

## 2016-09-15 NOTE — Progress Notes (Signed)
PROGRESS NOTE    [name]  CZY:[id number] DOB: Apr 30, 1956 DOA: 09/13/2016 PCP: Minerva Ends, MD    Brief Narrative:  61 y.o. female who presented w/ pleuritic CP and elevated troponins. Initially admitted to Cardiology. Possible subsequent development of HCAP vs continued fluid overload from CHF. Repeat echo from 2 weeks ago shows worsening systolic function with a drop in ejection fraction to 45% from 60%. Patient also with diastolic dysfunction. Situation is complicated by worsening renal function making continued diuresis difficult. Suspect the majority of patient's symptoms are result of long-standing history of uncontrolled hypertension in the setting of diabetes  Assessment & Plan:   Principal Problem:   Acute combined systolic and diastolic congestive heart failure ([place]) - cardiology on board and managing.   - Pt on B blocker, hydralazine - seems compensated - I suspect respiratory problems are more likely secondary to this problem as such will discontinue antibiotics. But should patient spike fevers would obtain blood cultures and restart broad-spectrum antibiotics  NSTEMI - Cardiology considering inpatient catheterization secondary to new wall motion abnormality on echo report and elevated troponin  Active Problems:   Diabetes mellitus type 2, uncontrolled, with complications ([place]) - Pt is on SSI and lantus. Will continue    Anemia in stage 3 chronic kidney disease - Stable currently    Essential hypertension - Pt on amlodipine, carvedilol,     CKD (chronic kidney disease) stage 3, GFR 30-59 ml/min - stable currently.    OSA (obstructive sleep apnea)   Diabetic neuropathy (HCC) - On Gabapentin   DVT prophylaxis: Heparin Code Status: Full Family Communication:  Disposition Plan: Pending final recommendations from cardiologist   Consultants:   Cardiology   Procedures: None   Antimicrobials: Discontinued today was on Vanco and  Zosyn   Subjective: Patient has no new complaints other than some blood in urine and when she coughs she notices some blood tinge.  Objective: Vitals:   09/15/16 0334 09/15/16 0741 09/15/16 1131 09/15/16 1614  BP: 140/71 139/71 137/68 132/66  Pulse: 75 72 81 71  Resp:  18 18 18   Temp: 98 F (36.7 C) 99.3 F (37.4 C) 99.1 F (37.3 C) 98.5 F (36.9 C)  TempSrc:  Oral Oral Oral  SpO2: 98% 93% 96% 95%  Weight: (!) 136.3 kg (300 lb 6.4 oz)     Height:        Intake/Output Summary (Last 24 hours) at 09/15/16 1717 Last data filed at 09/15/16 1500  Gross per 24 hour  Intake             2770 ml  Output             1875 ml  Net              895 ml   Filed Weights   09/13/16 1653 09/14/16 0403 09/15/16 0334  Weight: 132.7 kg (292 lb 9.6 oz) 135.1 kg (297 lb 12.8 oz) (!) 136.3 kg (300 lb 6.4 oz)    Examination:  General exam: Appears calm and comfortable , In no acute distress Respiratory system: No increased work of breathing, no wheezes Cardiovascular system: S1 & S2 heard, RRR.  Gastrointestinal system: Abdomen is nondistended, soft and nontender. No organomegaly or masses felt. Normal bowel sounds heard. Central nervous system: Alert and oriented. No focal neurological deficits. Extremities: Symmetric 5 x 5 power. No cyanosis Skin: No rashes, lesions or ulcers, and limited exam Psychiatry:  Mood & affect appropriate.     Data  Reviewed: I have personally reviewed following labs and imaging studies  CBC:  Recent Labs Lab 09/13/16 0418 09/14/16 0249 09/15/16 0328  WBC 9.6 10.2 8.1  NEUTROABS 6.5  --  4.4  HGB 10.0* 9.1* 9.0*  HCT 31.2* 29.1* 28.4*  MCV 87.4 87.7 88.8  PLT 247 258 009   Basic Metabolic Panel:  Recent Labs Lab 09/13/16 0418 09/14/16 0249 09/15/16 1136  NA 139 137 137  K 4.1 4.0 4.1  CL 107 106 106  CO2 23 24 22   GLUCOSE 259* 252* 149*  BUN 30* 34* 37*  CREATININE 1.73* 2.37* 2.27*  CALCIUM 8.5* 8.1* 7.8*   GFR: Estimated Creatinine  Clearance: 39.6 mL/min (by C-G formula based on SCr of 2.27 mg/dL (H)). Liver Function Tests:  Recent Labs Lab 09/13/16 0418 09/14/16 0249  AST 27 18  ALT 36 25  ALKPHOS 200* 152*  BILITOT 0.6 0.8  PROT 6.8 6.3*  ALBUMIN 3.4* 2.9*    Recent Labs Lab 09/13/16 0418  LIPASE 20   No results for input(s): AMMONIA in the last 168 hours. Coagulation Profile:  Recent Labs Lab 09/14/16 0249  INR 1.09   Cardiac Enzymes:  Recent Labs Lab 09/13/16 1245 09/13/16 1526 09/13/16 2103 09/14/16 0249  TROPONINI 0.65* 0.82* 0.86* 0.88*   BNP (last 3 results) No results for input(s): PROBNP in the last 8760 hours. HbA1C:  Recent Labs  09/13/16 1245  HGBA1C 8.0*   CBG:  Recent Labs Lab 09/14/16 1548 09/14/16 2036 09/15/16 0744 09/15/16 1130 09/15/16 1616  GLUCAP 186* 119* 201* 148* 128*   Lipid Profile:  Recent Labs  09/13/16 1245  CHOL 122  HDL 36*  LDLCALC 52  TRIG 168*  CHOLHDL 3.4   Thyroid Function Tests:  Recent Labs  09/13/16 1245  TSH 1.860   Anemia Panel: No results for input(s): VITAMINB12, FOLATE, FERRITIN, TIBC, IRON, RETICCTPCT in the last 72 hours. Sepsis Labs:  Recent Labs Lab 09/14/16 1200  PROCALCITON <0.10    Recent Results (from the past 240 hour(s))  MRSA PCR Screening     Status: None   Collection Time: 09/13/16  6:18 PM  Result Value Ref Range Status   MRSA by PCR NEGATIVE NEGATIVE Final    Comment:        The GeneXpert MRSA Assay (FDA approved for NASAL specimens only), is one component of a comprehensive MRSA colonization surveillance program. It is not intended to diagnose MRSA infection nor to guide or monitor treatment for MRSA infections.      Radiology Studies: No results found.  Scheduled Meds: . amLODipine  5 mg Oral Daily  . aspirin EC  81 mg Oral Daily  . atorvastatin  80 mg Oral q1800  . carvedilol  25 mg Oral BID WC  . colchicine  0.6 mg Oral BID  . gabapentin  200 mg Oral TID  . guaiFENesin   1,200 mg Oral BID  . hydrALAZINE  50 mg Oral TID  . insulin aspart  0-5 Units Subcutaneous QHS  . insulin aspart  0-9 Units Subcutaneous TID WC  . insulin aspart  10 Units Subcutaneous TID WC  . insulin glargine  50 Units Subcutaneous Daily  . pantoprazole (PROTONIX) IV  40 mg Intravenous Q24H  . piperacillin-tazobactam (ZOSYN)  IV  3.375 g Intravenous Q8H  . sodium chloride flush  3 mL Intravenous Q12H  . valACYclovir  500 mg Oral Daily  . vancomycin  1,500 mg Intravenous Q24H   Continuous Infusions: . heparin 1,600 Units/hr (  09/15/16 0801)  . nitroGLYCERIN Stopped (09/14/16 1900)     LOS: 2 days    Time spent: > 35 minutes  Velvet Bathe, MD [facility] Pager [phone]  If 7PM-7AM, please contact night-coverage [email or web address] Password [facility] 09/15/2016, 5:17 PM

## 2016-09-15 NOTE — Progress Notes (Signed)
SUBJECTIVE: pt with c/o blood tinged sputum and urine. Constant mild chest pain at rest, worsened with inspiration. Dyspnea with minimal exertion  Tele: sinus  BP 139/71 (BP Location: Left Arm)   Pulse 72   Temp 99.3 F (37.4 C) (Oral)   Resp 18   Ht 5\' 8"  (1.727 m)   Wt (!) 300 lb 6.4 oz (136.3 kg)   SpO2 93%   BMI 45.68 kg/m   Intake/Output Summary (Last 24 hours) at 09/15/16 1101 Last data filed at 09/15/16 1035  Gross per 24 hour  Intake             2642 ml  Output             1250 ml  Net             1392 ml    PHYSICAL EXAM General: Well developed, well nourished, in no acute distress. Alert and oriented x 3.  Psych:  Good affect, responds appropriately Neck: No JVD. No masses noted.  Lungs: Clear bilaterally with no wheezes or rhonci noted.  Heart: RRR with no murmurs noted. Abdomen: Bowel sounds are present. Soft, non-tender.  Extremities: No lower extremity edema.   LABS: Basic Metabolic Panel:  Recent Labs  09/13/16 0418 09/14/16 0249  NA 139 137  K 4.1 4.0  CL 107 106  CO2 23 24  GLUCOSE 259* 252*  BUN 30* 34*  CREATININE 1.73* 2.37*  CALCIUM 8.5* 8.1*   CBC:  Recent Labs  09/13/16 0418 09/14/16 0249 09/15/16 0328  WBC 9.6 10.2 8.1  NEUTROABS 6.5  --  4.4  HGB 10.0* 9.1* 9.0*  HCT 31.2* 29.1* 28.4*  MCV 87.4 87.7 88.8  PLT 247 258 231   Cardiac Enzymes:  Recent Labs  09/13/16 1526 09/13/16 2103 09/14/16 0249  TROPONINI 0.82* 0.86* 0.88*   Fasting Lipid Panel:  Recent Labs  09/13/16 1245  CHOL 122  HDL 36*  LDLCALC 52  TRIG 168*  CHOLHDL 3.4    Current Meds: . amLODipine  5 mg Oral Daily  . aspirin EC  81 mg Oral Daily  . atorvastatin  80 mg Oral q1800  . carvedilol  25 mg Oral BID WC  . colchicine  0.6 mg Oral BID  . gabapentin  200 mg Oral TID  . guaiFENesin  1,200 mg Oral BID  . hydrALAZINE  50 mg Oral TID  . insulin aspart  0-5 Units Subcutaneous QHS  . insulin aspart  0-9 Units Subcutaneous TID WC  .  insulin aspart  10 Units Subcutaneous TID WC  . insulin glargine  50 Units Subcutaneous Daily  . pantoprazole (PROTONIX) IV  40 mg Intravenous Q24H  . piperacillin-tazobactam (ZOSYN)  IV  3.375 g Intravenous Q8H  . sodium chloride flush  3 mL Intravenous Q12H  . valACYclovir  500 mg Oral Daily  . vancomycin  1,500 mg Intravenous Q24H   Echo 09/14/16: Left ventricle: Septal and apical hypokinesis The cavity size was   normal. Systolic function was mildly reduced. The estimated   ejection fraction was in the range of 45% to 50%. Wall motion was   normal; there were no regional wall motion abnormalities. Doppler   parameters are consistent with both elevated ventricular   end-diastolic filling pressure and elevated left atrial filling   pressure. - Aortic valve: There was trivial regurgitation. Valve area (VTI):   1.38 cm^2. Valve area (Vmax): 1.24 cm^2. Valve area (Vmean): 1.27   cm^2. - Left  atrium: The atrium was mildly dilated. - Atrial septum: No defect or patent foramen ovale was identified. - Pulmonary arteries: PA peak pressure: 38 mm Hg (S).  ASSESSMENT AND PLAN: 61 yo female with no known CAD admitted with chest pain. Troponin is elevated.   1. Chest pain/elevated troponin: Her pain is atypical, pleuritic but her echo shows new septal and apical wall motion abnormality with LVEF=45-50%. EKG without ischemic changes. Troponin elevated with flat trend. Cannot exclude acute coronary syndrome. She is currently on IV heparin. She is a poor cath candidate given her renal insufficiency but may have to consider inpatient cath given new wall motion abnormality on echo and elevated troponin. Will repeat BMET today and in am.   2. DM: SSI. Blood sugar better controlled.   3. Acute on chronic kidney disease, stage 3: Will order BMET now. Based on her renal function over the next 24 hours will decide if ischemic evaluation should be with a cardiac cath or nuclear study.   4. Anemia of chronic  disease: H/H stable.   5. HTN: BP controlled this am.     [name]  3/6/201811:01 AM

## 2016-09-15 NOTE — Progress Notes (Signed)
09/15/2016- respiratory care note- Pt placed on cpap for the night- Tolerating well.

## 2016-09-15 NOTE — Progress Notes (Signed)
ANTICOAGULATION CONSULT NOTE - Follow Up Consult  Pharmacy Consult for Heparin Indication: chest pain/ACS  Allergies  Allergen Reactions  . Shellfish Allergy Anaphylaxis and Swelling  . Diazepam Other (See Comments)    "felt like out of body experience"  . Morphine And Related Itching    Patient Measurements: Height: 5\' 8"  (172.7 cm) Weight: (!) 300 lb 6.4 oz (136.3 kg) IBW/kg (Calculated) : 63.9 Heparin Dosing Weight:  95.7 kg  Vital Signs: Temp: 99.3 F (37.4 C) (03/06 0741) Temp Source: Oral (03/06 0741) BP: 139/71 (03/06 0741) Pulse Rate: 72 (03/06 0741)  Labs:  Recent Labs  09/13/16 0418  09/13/16 1526 09/13/16 2103 09/14/16 0249 09/14/16 1200 09/15/16 0328  HGB 10.0*  --   --   --  9.1*  --  9.0*  HCT 31.2*  --   --   --  29.1*  --  28.4*  PLT 247  --   --   --  258  --  231  LABPROT  --   --   --   --  14.1  --   --   INR  --   --   --   --  1.09  --   --   HEPARINUNFRC  --   < >  --  <0.10* 0.45 0.46 0.59  CREATININE 1.73*  --   --   --  2.37*  --   --   TROPONINI  --   < > 0.82* 0.86* 0.88*  --   --   < > = values in this interval not displayed.  Estimated Creatinine Clearance: 37.9 mL/min (by C-G formula based on SCr of 2.37 mg/dL (H)).    Assessment:  Anticoag: On heparin gtt for ACS. Heparin level at goal this morning. Hgb stable at 9, plts wnl. RN reports no issues with heparin.  Goal of Therapy:  Heparin level 0.3-0.7 units/ml Monitor platelets by anticoagulation protocol: Yes   Plan:  Heparin infusion at 1600 units/hr Daily HL and CBC Per Cards plan yesterday heparin should stop today  Erin Hearing PharmD., BCPS Clinical Pharmacist Pager [phone] 09/15/2016 10:21 AM

## 2016-09-15 NOTE — Plan of Care (Signed)
Problem: Activity: Goal: Capacity to carry out activities will improve Outcome: Progressing Ambulated to bathroom; able to perform bath independlenty at the sink. No s/s shortness or breath or chest discomfort.

## 2016-09-16 ENCOUNTER — Encounter (HOSPITAL_COMMUNITY)
Admission: EM | Disposition: A | Discharge status: Home / Self Care | Payer: Self-pay | Source: Home / Self Care | Attending: Cardiovascular Disease

## 2016-09-16 ENCOUNTER — Telehealth: Payer: Self-pay

## 2016-09-16 DIAGNOSIS — E1142 Type 2 diabetes mellitus with diabetic polyneuropathy: Secondary | ICD-10-CM

## 2016-09-16 DIAGNOSIS — R748 Abnormal levels of other serum enzymes: Secondary | ICD-10-CM

## 2016-09-16 DIAGNOSIS — I5041 Acute combined systolic (congestive) and diastolic (congestive) heart failure: Secondary | ICD-10-CM

## 2016-09-16 DIAGNOSIS — I10 Essential (primary) hypertension: Secondary | ICD-10-CM

## 2016-09-16 DIAGNOSIS — E118 Type 2 diabetes mellitus with unspecified complications: Secondary | ICD-10-CM

## 2016-09-16 DIAGNOSIS — I25110 Atherosclerotic heart disease of native coronary artery with unstable angina pectoris: Secondary | ICD-10-CM

## 2016-09-16 HISTORY — PX: LEFT HEART CATH AND CORONARY ANGIOGRAPHY: CATH118249

## 2016-09-16 LAB — CBC
HCT: 28.3 % — ABNORMAL LOW (ref 36.0–46.0)
HCT: 28.4 % — ABNORMAL LOW (ref 36.0–46.0)
HCT: 30.4 % — ABNORMAL LOW (ref 36.0–46.0)
Hemoglobin: 8.8 g/dL — ABNORMAL LOW (ref 12.0–15.0)
Hemoglobin: 9 g/dL — ABNORMAL LOW (ref 12.0–15.0)
Hemoglobin: 9.3 g/dL — ABNORMAL LOW (ref 12.0–15.0)
MCH: 27 pg (ref 26.0–34.0)
MCH: 27.8 pg (ref 26.0–34.0)
MCH: 27.9 pg (ref 26.0–34.0)
MCHC: 30.6 g/dL (ref 30.0–36.0)
MCHC: 31.1 g/dL (ref 30.0–36.0)
MCHC: 31.7 g/dL (ref 30.0–36.0)
MCV: 87.9 fL (ref 78.0–100.0)
MCV: 88.4 fL (ref 78.0–100.0)
MCV: 89.3 fL (ref 78.0–100.0)
Platelets: 221 10*3/uL (ref 150–400)
Platelets: 229 10*3/uL (ref 150–400)
Platelets: 243 10*3/uL (ref 150–400)
RBC: 3.17 MIL/uL — ABNORMAL LOW (ref 3.87–5.11)
RBC: 3.23 MIL/uL — ABNORMAL LOW (ref 3.87–5.11)
RBC: 3.44 MIL/uL — ABNORMAL LOW (ref 3.87–5.11)
RDW: 14.7 % (ref 11.5–15.5)
RDW: 14.7 % (ref 11.5–15.5)
RDW: 14.9 % (ref 11.5–15.5)
WBC: 7.1 10*3/uL (ref 4.0–10.5)
WBC: 7.4 10*3/uL (ref 4.0–10.5)
WBC: 9 10*3/uL (ref 4.0–10.5)

## 2016-09-16 LAB — PROCALCITONIN: Procalcitonin: 0.1 ng/mL

## 2016-09-16 LAB — GLUCOSE, CAPILLARY
Glucose-Capillary: 177 mg/dL — ABNORMAL HIGH (ref 65–99)
Glucose-Capillary: 148 mg/dL — ABNORMAL HIGH (ref 65–99)
Glucose-Capillary: 133 mg/dL — ABNORMAL HIGH (ref 65–99)
Glucose-Capillary: 184 mg/dL — ABNORMAL HIGH (ref 65–99)

## 2016-09-16 LAB — BASIC METABOLIC PANEL
Anion gap: 12 (ref 5–15)
BUN: 41 mg/dL — ABNORMAL HIGH (ref 6–20)
CO2: 21 mmol/L — ABNORMAL LOW (ref 22–32)
Calcium: 7.8 mg/dL — ABNORMAL LOW (ref 8.9–10.3)
Chloride: 103 mmol/L (ref 101–111)
Creatinine, Ser: 2.28 mg/dL — ABNORMAL HIGH (ref 0.44–1.00)
GFR calc Af Amer: 26 mL/min — ABNORMAL LOW (ref >60)
GFR calc non Af Amer: 22 mL/min — ABNORMAL LOW (ref >60)
Glucose, Bld: 203 mg/dL — ABNORMAL HIGH (ref 65–99)
Potassium: 4.1 mmol/L (ref 3.5–5.1)
Sodium: 136 mmol/L (ref 135–145)

## 2016-09-16 LAB — PROTIME-INR
INR: 1.1
Prothrombin Time: 14.2 seconds (ref 11.4–15.2)

## 2016-09-16 LAB — HEPARIN LEVEL (UNFRACTIONATED): Heparin Unfractionated: 0.59 IU/mL (ref 0.30–0.70)

## 2016-09-16 SURGERY — LEFT HEART CATH AND CORONARY ANGIOGRAPHY

## 2016-09-16 MED ORDER — MIDAZOLAM HCL 2 MG/2ML IJ SOLN
INTRAMUSCULAR | Status: AC
  Filled 2016-09-16: qty 2

## 2016-09-16 MED ORDER — HEPARIN (PORCINE) IN NACL 2-0.9 UNIT/ML-% IJ SOLN
INTRAMUSCULAR | Status: AC
  Filled 2016-09-16: qty 1000

## 2016-09-16 MED ORDER — SODIUM CHLORIDE 0.9 % IV SOLN
INTRAVENOUS | Status: DC
  Administered 2016-09-16: 11:00:00 via INTRAVENOUS

## 2016-09-16 MED ORDER — SODIUM CHLORIDE 0.9 % IV SOLN
INTRAVENOUS | Status: AC
  Administered 2016-09-16: 18:00:00 via INTRAVENOUS

## 2016-09-16 MED ORDER — LIDOCAINE HCL (PF) 1 % IJ SOLN
INTRAMUSCULAR | Status: AC
  Filled 2016-09-16: qty 30

## 2016-09-16 MED ORDER — SODIUM CHLORIDE 0.9 % IV SOLN: 250.0000 mL | INTRAVENOUS | Status: DC | PRN

## 2016-09-16 MED ORDER — FUROSEMIDE 10 MG/ML IJ SOLN
40.0000 mg | Freq: Once | INTRAMUSCULAR | Status: AC
  Administered 2016-09-16: 40 mg via INTRAVENOUS
  Filled 2016-09-16: qty 4

## 2016-09-16 MED ORDER — ASPIRIN 81 MG PO CHEW
81.0000 mg | CHEWABLE_TABLET | ORAL | Status: DC
  Filled 2016-09-16: qty 1

## 2016-09-16 MED ORDER — SODIUM CHLORIDE 0.9% FLUSH
3.0000 mL | Freq: Two times a day (BID) | INTRAVENOUS | Status: DC
  Administered 2016-09-16: 3 mL via INTRAVENOUS

## 2016-09-16 MED ORDER — IOPAMIDOL (ISOVUE-370) INJECTION 76%
INTRAVENOUS | Status: DC | PRN
  Administered 2016-09-16: 70 mL via INTRA_ARTERIAL

## 2016-09-16 MED ORDER — FENTANYL CITRATE (PF) 100 MCG/2ML IJ SOLN
INTRAMUSCULAR | Status: DC | PRN
  Administered 2016-09-16: 50 ug via INTRAVENOUS

## 2016-09-16 MED ORDER — FENTANYL CITRATE (PF) 100 MCG/2ML IJ SOLN
INTRAMUSCULAR | Status: AC
  Filled 2016-09-16: qty 2

## 2016-09-16 MED ORDER — LIDOCAINE HCL (PF) 1 % IJ SOLN
INTRAMUSCULAR | Status: DC | PRN
  Administered 2016-09-16: 2 mL

## 2016-09-16 MED ORDER — VERAPAMIL HCL 2.5 MG/ML IV SOLN
INTRAVENOUS | Status: AC
  Filled 2016-09-16: qty 2

## 2016-09-16 MED ORDER — HEPARIN (PORCINE) IN NACL 2-0.9 UNIT/ML-% IJ SOLN
INTRAMUSCULAR | Status: DC | PRN
  Administered 2016-09-16: 1000 mL

## 2016-09-16 MED ORDER — VERAPAMIL HCL 2.5 MG/ML IV SOLN
INTRAVENOUS | Status: DC | PRN
  Administered 2016-09-16: 10 mL via INTRA_ARTERIAL

## 2016-09-16 MED ORDER — SODIUM CHLORIDE 0.9% FLUSH: 3.0000 mL | Freq: Two times a day (BID) | INTRAVENOUS | Status: DC

## 2016-09-16 MED ORDER — HEPARIN SODIUM (PORCINE) 1000 UNIT/ML IJ SOLN
INTRAMUSCULAR | Status: DC | PRN
  Administered 2016-09-16: 6000 [IU] via INTRAVENOUS

## 2016-09-16 MED ORDER — PANTOPRAZOLE SODIUM 40 MG PO TBEC
40.0000 mg | DELAYED_RELEASE_TABLET | Freq: Every day | ORAL | Status: DC
  Administered 2016-09-17 – 2016-09-18 (×2): 40 mg via ORAL
  Filled 2016-09-16 (×2): qty 1

## 2016-09-16 MED ORDER — HEPARIN SODIUM (PORCINE) 1000 UNIT/ML IJ SOLN
INTRAMUSCULAR | Status: AC
  Filled 2016-09-16: qty 1

## 2016-09-16 MED ORDER — HEPARIN (PORCINE) IN NACL 100-0.45 UNIT/ML-% IJ SOLN
1600.0000 [IU]/h | INTRAMUSCULAR | Status: DC
  Administered 2016-09-16 – 2016-09-17 (×2): 1600 [IU]/h via INTRAVENOUS
  Filled 2016-09-16 (×2): qty 250

## 2016-09-16 MED ORDER — ASPIRIN 81 MG PO CHEW
81.0000 mg | CHEWABLE_TABLET | ORAL | Status: AC
  Administered 2016-09-16: 81 mg via ORAL

## 2016-09-16 MED ORDER — SODIUM CHLORIDE 0.9% FLUSH: 3.0000 mL | INTRAVENOUS | Status: DC | PRN

## 2016-09-16 MED ORDER — TICAGRELOR 90 MG PO TABS
90.0000 mg | ORAL_TABLET | Freq: Two times a day (BID) | ORAL | Status: DC
  Administered 2016-09-17 – 2016-09-18 (×4): 90 mg via ORAL
  Filled 2016-09-16 (×4): qty 1

## 2016-09-16 MED ORDER — MIDAZOLAM HCL 2 MG/2ML IJ SOLN
INTRAMUSCULAR | Status: DC | PRN
  Administered 2016-09-16: 1 mg via INTRAVENOUS
  Administered 2016-09-16: 2 mg via INTRAVENOUS

## 2016-09-16 MED ORDER — TICAGRELOR 90 MG PO TABS
180.0000 mg | ORAL_TABLET | Freq: Once | ORAL | Status: AC
  Administered 2016-09-16: 180 mg via ORAL
  Filled 2016-09-16: qty 2

## 2016-09-16 SURGICAL SUPPLY — 10 items
CATH EXPO 5FR FR4 (CATHETERS) ×2 IMPLANT
CATH INFINITI 5 FR JL3.5 (CATHETERS) ×1 IMPLANT
DEVICE RAD COMP TR BAND LRG (VASCULAR PRODUCTS) ×1 IMPLANT
GLIDESHEATH SLEND SS 6F .021 (SHEATH) ×1 IMPLANT
GUIDEWIRE INQWIRE 1.5J.035X260 (WIRE) IMPLANT
INQWIRE 1.5J .035X260CM (WIRE) ×2
KIT HEART LEFT (KITS) ×2 IMPLANT
PACK CARDIAC CATHETERIZATION (CUSTOM PROCEDURE TRAY) ×2 IMPLANT
TRANSDUCER W/STOPCOCK (MISCELLANEOUS) ×2 IMPLANT
TUBING CIL FLEX 10 FLL-RA (TUBING) ×2 IMPLANT

## 2016-09-16 NOTE — Progress Notes (Signed)
PROGRESS NOTE    [name]  IWO:[id number] DOB: 05-24-1956 DOA: 09/13/2016 PCP: Minerva Ends, MD   Brief Narrative: [name] is a 61 y.o. female that presented with chest pain and elevated troponin. Patient has a recent echocardiogram significant for worsened EF of 50% with diastolic dysfunction. She has continued chest pain with minimal exertion.   Assessment & Plan:   Principal Problem:   Acute on chronic diastolic congestive heart failure, NYHA class 4 (HCC) Active Problems:   Diabetes mellitus type 2, uncontrolled, with complications (HCC)   Anemia in stage 3 chronic kidney disease   Essential hypertension   CKD (chronic kidney disease) stage 3, GFR 30-59 ml/min   OSA (obstructive sleep apnea)   Diabetic neuropathy (HCC)   NSTEMI (non-ST elevated myocardial infarction) ([place])   Hypertensive emergency   Acute combined systolic and diastolic congestive heart failure ([place])   Diabetes mellitus with complication (HCC)   Elevated troponin   Abnormal chest x-[place]oyd Initially treated as pneumonia empirically. Patient is asymptomatic with no cough or fever. She does have dyspnea which is present during ambulation and lying down flat. Likely related to cardiac etiology. Clinically does not correlate with pneumonia. -antibiotics already discontinued -please re-consult if patient spikes fevers, develops cough, or if you feel we should take another look.   NSTEMI -per primary, cardiology  Diabetes mellitus Reasonably controlled -Continue Lantus 50 units -Continue sliding-scale insulin  Anemia in stage 3 chronic kidney disease Stable  Essential hypertension Patient on amlodipine and carvedilol  Chronic kidney disease, stage 3 Worsened from baseline and is stable  Obstructive sleep apnea -Continue CPAP   Diabetic neuropathy -Continue gabapentin   DVT prophylaxis: Heparin drip  Code Status: Full code  Family Communication: None at bedside  Disposition Plan:  Per primary cardiology team    Antimicrobials:  Vancomycin (3/4-3/6)  Zosyn (3/4-3/6)    Subjective: Patient reports no coughing or sputum production at the day. She does have some bloody discharge while blowing her nose with some drainage down the back of her throat which she does cough up in the morning. She does have some dyspnea that is mainly hurting her when she is getting up to walk and lying down flat.  Objective: Vitals:   09/15/16 1614 09/15/16 2004 09/16/16 0500 09/16/16 0721  BP: 132/66 132/60 128/70 (!) 156/81  Pulse: 71 83 79 77  Resp: 18 18 18 16   Temp: 98.5 F (36.9 C) 98 F (36.7 C) 98.5 F (36.9 C) 98.6 F (37 C)  TempSrc: Oral Oral Oral Oral  SpO2: 95% 94% 95% 96%  Weight:   (!) 136.7 kg (301 lb 6.4 oz)   Height:        Intake/Output Summary (Last 24 hours) at 09/16/16 0927 Last data filed at 09/16/16 0831  Gross per 24 hour  Intake             2406 ml  Output             2025 ml  Net              381 ml   Filed Weights   09/14/16 0403 09/15/16 0334 09/16/16 0500  Weight: 135.1 kg (297 lb 12.8 oz) (!) 136.3 kg (300 lb 6.4 oz) (!) 136.7 kg (301 lb 6.4 oz)    Examination:  General exam: Appears calm and comfortable. Sitting on the side of the bed. Respiratory system: Crackles at bases bilaterally. Respiratory effort normal. Cardiovascular system: S1 & S2 heard,  RRR. 2/6 systolic murmur heart best at RUSB. Gastrointestinal system: Abdomen is nondistended, soft and nontender. No organomegaly or masses felt. Normal bowel sounds heard. Central nervous system: Alert and oriented. No focal neurological deficits. Extremities: No edema. No calf tenderness Skin: No cyanosis. No rashes Psychiatry: Judgement and insight appear normal. Mood & affect appropriate.     Data Reviewed: I have personally reviewed following labs and imaging studies  CBC:  Recent Labs Lab 09/13/16 0418 09/14/16 0249 09/15/16 0328 09/16/16 0001 09/16/16 0558  WBC 9.6 10.2  8.1 7.4 9.0  NEUTROABS 6.5  --  4.4  --   --   HGB 10.0* 9.1* 9.0* 8.8* 9.0*  HCT 31.2* 29.1* 28.4* 28.3* 28.4*  MCV 87.4 87.7 88.8 89.3 87.9  PLT 247 258 231 221 786   Basic Metabolic Panel:  Recent Labs Lab 09/13/16 0418 09/14/16 0249 09/15/16 1136 09/16/16 0558  NA 139 137 137 136  K 4.1 4.0 4.1 4.1  CL 107 106 106 103  CO2 23 24 22  21*  GLUCOSE 259* 252* 149* 203*  BUN 30* 34* 37* 41*  CREATININE 1.73* 2.37* 2.27* 2.28*  CALCIUM 8.5* 8.1* 7.8* 7.8*   GFR: Estimated Creatinine Clearance: 39.5 mL/min (by C-G formula based on SCr of 2.28 mg/dL (H)). Liver Function Tests:  Recent Labs Lab 09/13/16 0418 09/14/16 0249  AST 27 18  ALT 36 25  ALKPHOS 200* 152*  BILITOT 0.6 0.8  PROT 6.8 6.3*  ALBUMIN 3.4* 2.9*    Recent Labs Lab 09/13/16 0418  LIPASE 20   No results for input(s): AMMONIA in the last 168 hours. Coagulation Profile:  Recent Labs Lab 09/14/16 0249  INR 1.09   Cardiac Enzymes:  Recent Labs Lab 09/13/16 1245 09/13/16 1526 09/13/16 2103 09/14/16 0249  TROPONINI 0.65* 0.82* 0.86* 0.88*   BNP (last 3 results) No results for input(s): PROBNP in the last 8760 hours. HbA1C:  Recent Labs  09/13/16 1245  HGBA1C 8.0*   CBG:  Recent Labs Lab 09/15/16 0744 09/15/16 1130 09/15/16 1616 09/15/16 2200 09/16/16 0717  GLUCAP 201* 148* 128* 165* 177*   Lipid Profile:  Recent Labs  09/13/16 1245  CHOL 122  HDL 36*  LDLCALC 52  TRIG 168*  CHOLHDL 3.4   Thyroid Function Tests:  Recent Labs  09/13/16 1245  TSH 1.860   Anemia Panel: No results for input(s): VITAMINB12, FOLATE, FERRITIN, TIBC, IRON, RETICCTPCT in the last 72 hours. Sepsis Labs:  Recent Labs Lab 09/14/16 1200 09/16/16 0558  PROCALCITON <0.10 0.10    Recent Results (from the past 240 hour(s))  MRSA PCR Screening     Status: None   Collection Time: 09/13/16  6:18 PM  Result Value Ref Range Status   MRSA by PCR NEGATIVE NEGATIVE Final    Comment:          The GeneXpert MRSA Assay (FDA approved for NASAL specimens only), is one component of a comprehensive MRSA colonization surveillance program. It is not intended to diagnose MRSA infection nor to guide or monitor treatment for MRSA infections.          Radiology Studies: No results found.      Scheduled Meds: . amLODipine  5 mg Oral Daily  . aspirin EC  81 mg Oral Daily  . atorvastatin  80 mg Oral q1800  . carvedilol  25 mg Oral BID WC  . colchicine  0.6 mg Oral BID  . gabapentin  200 mg Oral TID  . guaiFENesin  1,200 mg  Oral BID  . hydrALAZINE  50 mg Oral TID  . insulin aspart  0-5 Units Subcutaneous QHS  . insulin aspart  0-9 Units Subcutaneous TID WC  . insulin aspart  10 Units Subcutaneous TID WC  . insulin glargine  50 Units Subcutaneous Daily  . pantoprazole (PROTONIX) IV  40 mg Intravenous Q24H  . sodium chloride flush  3 mL Intravenous Q12H  . valACYclovir  500 mg Oral Daily   Continuous Infusions: . heparin 1,600 Units/hr (09/16/16 0203)  . nitroGLYCERIN Stopped (09/14/16 1900)     LOS: 3 days     Cordelia Poche [facility] 09/16/2016, 9:27 AM Pager: [phone]  If 7PM-7AM, please contact night-coverage [email or web address] Password [facility] 09/16/2016, 9:27 AM

## 2016-09-16 NOTE — Interval H&P Note (Signed)
History and Physical Interval Note:  09/16/2016 3:57 PM  [name]  has presented today for cardiac cath with the diagnosis of unstable angina/NSTEMI.  The various methods of treatment have been discussed with the patient and family. After consideration of risks, benefits and other options for treatment, the patient has consented to  Procedure(s): Left Heart Cath and Coronary Angiography (N/A) as a surgical intervention .  The patient's history has been reviewed, patient examined, no change in status, stable for surgery.  I have reviewed the patient's chart and labs.  Questions were answered to the patient's satisfaction.    Cath Lab Visit (complete for each Cath Lab visit)  Clinical Evaluation Leading to the Procedure:   ACS: Yes.    Non-ACS:    Anginal Classification: CCS III  Anti-ischemic medical therapy: Maximal Therapy (2 or more classes of medications)  Non-Invasive Test Results: No non-invasive testing performed  Prior CABG: No previous CABG         [name]

## 2016-09-16 NOTE — Progress Notes (Signed)
ANTICOAGULATION CONSULT NOTE - Follow Up Consult  Pharmacy Consult for Heparin Indication: chest pain/ACS  Allergies  Allergen Reactions  . Shellfish Allergy Anaphylaxis and Swelling  . Diazepam Other (See Comments)    "felt like out of body experience"  . Morphine And Related Itching    Patient Measurements: Height: 5\' 8"  (172.7 cm) Weight: (!) 301 lb 6.4 oz (136.7 kg) IBW/kg (Calculated) : 63.9 Heparin Dosing Weight:  95.7 kg  Vital Signs: Temp: 98.6 F (37 C) (03/07 0721) Temp Source: Oral (03/07 0721) BP: 156/81 (03/07 0721) Pulse Rate: 77 (03/07 0721)  Labs:  Recent Labs  09/13/16 1526 09/13/16 2103  09/14/16 0249 09/14/16 1200 09/15/16 0328 09/15/16 1136 09/16/16 0001 09/16/16 0558  HGB  --   --   < > 9.1*  --  9.0*  --  8.8* 9.0*  HCT  --   --   < > 29.1*  --  28.4*  --  28.3* 28.4*  PLT  --   --   < > 258  --  231  --  221 243  LABPROT  --   --   --  14.1  --   --   --   --   --   INR  --   --   --  1.09  --   --   --   --   --   HEPARINUNFRC  --  <0.10*  --  0.45 0.46 0.59  --   --  0.59  CREATININE  --   --   --  2.37*  --   --  2.27*  --  2.28*  TROPONINI 0.82* 0.86*  --  0.88*  --   --   --   --   --   < > = values in this interval not displayed.  Estimated Creatinine Clearance: 39.5 mL/min (by C-G formula based on SCr of 2.28 mg/dL (H)).    Assessment:  Anticoag: On heparin gtt for ACS. Heparin level at goal this morning. Hgb stable at 9, plts wnl. RN reports no issues with heparin.  Plan to continue heparin for now, cath is still a consideration.  Goal of Therapy:  Heparin level 0.3-0.7 units/ml Monitor platelets by anticoagulation protocol: Yes   Plan:  Heparin infusion at 1600 units/hr Daily HL and CBC  Erin Hearing PharmD., BCPS Clinical Pharmacist Pager [phone] 09/16/2016 10:35 AM

## 2016-09-16 NOTE — H&P (View-Only) (Signed)
Progress Note  Patient Name: [name] Date of Encounter: 09/16/2016  Primary Cardiologist: Dr. Burt Knack  Subjective   Chest pain and DOE - walked down hall last pm and developed chest pressure and SOB had to stop and taken to room by wheelchair.  Morphine relieved. waking to BR has not caused pain.  Still with blood flecks when blows nose.   Inpatient Medications    Scheduled Meds: . amLODipine  5 mg Oral Daily  . aspirin EC  81 mg Oral Daily  . atorvastatin  80 mg Oral q1800  . carvedilol  25 mg Oral BID WC  . colchicine  0.6 mg Oral BID  . gabapentin  200 mg Oral TID  . guaiFENesin  1,200 mg Oral BID  . hydrALAZINE  50 mg Oral TID  . insulin aspart  0-5 Units Subcutaneous QHS  . insulin aspart  0-9 Units Subcutaneous TID WC  . insulin aspart  10 Units Subcutaneous TID WC  . insulin glargine  50 Units Subcutaneous Daily  . pantoprazole (PROTONIX) IV  40 mg Intravenous Q24H  . sodium chloride flush  3 mL Intravenous Q12H  . valACYclovir  500 mg Oral Daily   Continuous Infusions: . heparin 1,600 Units/hr (09/16/16 0203)  . nitroGLYCERIN Stopped (09/14/16 1900)   PRN Meds: albuterol, hydrALAZINE, ketotifen, morphine injection, traMADol   Vital Signs    Vitals:   09/15/16 1614 09/15/16 2004 09/16/16 0500 09/16/16 0721  BP: 132/66 132/60 128/70 (!) 156/81  Pulse: 71 83 79 77  Resp: 18 18 18 16   Temp: 98.5 F (36.9 C) 98 F (36.7 C) 98.5 F (36.9 C) 98.6 F (37 C)  TempSrc: Oral Oral Oral Oral  SpO2: 95% 94% 95% 96%  Weight:   (!) 301 lb 6.4 oz (136.7 kg)   Height:        Intake/Output Summary (Last 24 hours) at 09/16/16 0756 Last data filed at 09/16/16 0500  Gross per 24 hour  Intake             2046 ml  Output             2025 ml  Net               21 ml   Filed Weights   09/14/16 0403 09/15/16 0334 09/16/16 0500  Weight: 297 lb 12.8 oz (135.1 kg) (!) 300 lb 6.4 oz (136.3 kg) (!) 301 lb 6.4 oz (136.7 kg)    Telemetry    SR to Slight ST  -  Personally Reviewed  ECG    No new but new ant changes from last year. - Personally Reviewed  Physical Exam   GEN: No acute distress.   Neck: No JVD Cardiac: RRR, no murmurs, rubs, or gallops.  Respiratory: Clear to auscultation bilaterally. GI: Soft, nontender, non-distended  MS: No edema; No deformity. Neuro:  Nonfocal  Psych: Normal affect   Labs    Chemistry Recent Labs Lab 09/13/16 0418 09/14/16 0249 09/15/16 1136 09/16/16 0558  NA 139 137 137 136  K 4.1 4.0 4.1 4.1  CL 107 106 106 103  CO2 23 24 22  21*  GLUCOSE 259* 252* 149* 203*  BUN 30* 34* 37* 41*  CREATININE 1.73* 2.37* 2.27* 2.28*  CALCIUM 8.5* 8.1* 7.8* 7.8*  PROT 6.8 6.3*  --   --   ALBUMIN 3.4* 2.9*  --   --   AST 27 18  --   --   ALT 36 25  --   --  ALKPHOS 200* 152*  --   --   BILITOT 0.6 0.8  --   --   GFRNONAA 31* 21* 23* 22*  GFRAA 36* 25* 26* 26*  ANIONGAP 9 7 9 12      Hematology Recent Labs Lab 09/15/16 0328 09/16/16 0001 09/16/16 0558  WBC 8.1 7.4 PENDING  RBC 3.20* 3.17* 3.23*  HGB 9.0* 8.8* 9.0*  HCT 28.4* 28.3* 28.4*  MCV 88.8 89.3 87.9  MCH 28.1 27.8 27.9  MCHC 31.7 31.1 31.7  RDW 14.9 14.9 14.7  PLT 231 221 PENDING    Cardiac Enzymes Recent Labs Lab 09/13/16 1245 09/13/16 1526 09/13/16 2103 09/14/16 0249  TROPONINI 0.65* 0.82* 0.86* 0.88*    Recent Labs Lab 09/13/16 0421  TROPIPOC 0.42*     BNP Recent Labs Lab 09/13/16 0418 09/13/16 1245  BNP 430.9* 554.8*     DDimer No results for input(s): DDIMER in the last 168 hours.   Radiology    No results found.  Cardiac Studies    Echo 09/14/16: Left ventricle: Septal and apical hypokinesis The cavity size was normal. Systolic function was mildly reduced. The estimated ejection fraction was in the range of 45% to 50%. Wall motion was normal; there were no regional wall motion abnormalities. Doppler parameters are consistent with both elevated ventricular end-diastolic filling pressure and  elevated left atrial filling pressure. - Aortic valve: There was trivial regurgitation. Valve area (VTI): 1.38 cm^2. Valve area (Vmax): 1.24 cm^2. Valve area (Vmean): 1.27 cm^2. - Left atrium: The atrium was mildly dilated. - Atrial septum: No defect or patent foramen ovale was identified. - Pulmonary arteries: PA peak pressure: 38 mm Hg (S).  Patient Profile     61 y.o. female AA femalewithout known CAD with recent accelerated angina episodes presented again with recurrent hypertensive emergency and CHF episodes as well as elevated troponin  Assessment & Plan    1. Per Dr. Angelena Form yesterday "Chest pain/elevated troponin: Her pain is atypical, pleuritic but her echo shows new septal and apical wall motion abnormality with LVEF=45-50%. EKG without ischemic changes. Troponin elevated with flat trend. Cannot exclude acute coronary syndrome. She is currently on IV heparin. She is a poor cath candidate given her renal insufficiency but may have to consider inpatient cath given new wall motion abnormality on echo and elevated troponin. Cr today 2.28 and yesterday 2.27 down from 2.37 and pk of 1.82.     --+ angina and SOB with walking in hall, relief with morphine.  ? Cath tomorrow  2. DM: SSI. Blood sugar better controlled.  (108 to 201)  3. Acute on chronic kidney disease, stage 3: Based on her renal function over the next 24 hours will decide if ischemic evaluation should be with a cardiac cath or nuclear study.   4. Anemia of chronic disease: H/H stable.   5. HTN: BP controlled this am.    Signed, Cecilie Kicks, NP  09/16/2016, 7:56 AM    Patient seen, examined. Available data reviewed. Agree with findings, assessment, and plan as outlined by Cecilie Kicks, NP. Exam: A&O, NAD, lungs CTA, heart RRR without murmur, extremities with trace edema, otherwise as above. Pt with segmental LV dysfunction on echo, elevated troponin, and recurrent CP/dyspnea ambulating in hall. I think she  needs cardiac cath to evaluate for obstructive CAD in the setting of a highly suggestive clinical presentation. I have reviewed the risks, indications, and alternatives to cardiac catheterization, possible angioplasty, and stenting with the patient. Risks include but are not limited  to bleeding, infection, vascular injury, stroke, myocardial infection, arrhythmia, kidney injury, radiation-related injury in the case of prolonged fluoroscopy use, emergency cardiac surgery, and death. The patient understands the risks of serious complication is 1-2 in 1287 with diagnostic cardiac cath and 1-2% or less with angioplasty/stenting.   She understands the increased risk of contrast nephropathy with cath in the setting of chronic kidney disease. Creatinine is stable. She is on no ACE/ARB or nephrotoxins. Plan 'limited dye' diagnostic cath later this afternoon after hydration. She understands PCI will not be done today unless an emergent indication arises.   Sherren Mocha, M.D. 09/16/2016 10:58 AM

## 2016-09-16 NOTE — Progress Notes (Signed)
ANTICOAGULATION CONSULT NOTE - Follow Up Consult  Pharmacy Consult for Heparin Indication: chest pain/ACS  Allergies  Allergen Reactions  . Shellfish Allergy Anaphylaxis and Swelling  . Diazepam Other (See Comments)    "felt like out of body experience"  . Morphine And Related Itching    Patient Measurements: Height: 5\' 8"  (172.7 cm) Weight: (!) 301 lb 6.4 oz (136.7 kg) IBW/kg (Calculated) : 63.9 Heparin Dosing Weight:  95.7 kg  Vital Signs: Temp: 98.1 F (36.7 C) (03/07 1146) Temp Source: Oral (03/07 1146) BP: 146/73 (03/07 1709) Pulse Rate: 74 (03/07 1645)  Labs:  Recent Labs  09/13/16 2103  09/14/16 0249 09/14/16 1200 09/15/16 0328 09/15/16 1136 09/16/16 0001 09/16/16 0558 09/16/16 1147  HGB  --   < > 9.1*  --  9.0*  --  8.8* 9.0* 9.3*  HCT  --   < > 29.1*  --  28.4*  --  28.3* 28.4* 30.4*  PLT  --   < > 258  --  231  --  221 243 229  LABPROT  --   --  14.1  --   --   --   --   --  14.2  INR  --   --  1.09  --   --   --   --   --  1.10  HEPARINUNFRC <0.10*  --  0.45 0.46 0.59  --   --  0.59  --   CREATININE  --   --  2.37*  --   --  2.27*  --  2.28*  --   TROPONINI 0.86*  --  0.88*  --   --   --   --   --   --   < > = values in this interval not displayed.  Estimated Creatinine Clearance: 39.5 mL/min (by C-G formula based on SCr of 2.28 mg/dL (H)).  Assessment:  Anticoag: On heparin gtt for ACS now s/p cardiac cath. RN reports sheath removed prior to returning to floor. Heparin level therapeutic earlier today, will restart heparin infusion at same rate. Hgb stable at 9.3, plts wnl. TR-band still in place, with no other issues reported.  Plan to restart heparin 6 hours post sheath removal.  Goal of Therapy:  Heparin level 0.3-0.7 units/ml Monitor platelets by anticoagulation protocol: Yes   Plan:  Restart heparin gtt at 1600 units/hr Daily heparin level and CBC Monitor for s/sx of bleeding   Georga Bora, PharmD Clinical Pharmacist Pager:  [phone] 09/16/2016 5:32 PM

## 2016-09-16 NOTE — Telephone Encounter (Signed)
CM unable to meet with patient today. Will follow her hospital course.

## 2016-09-16 NOTE — Progress Notes (Signed)
Progress Note  Patient Name: [name] Date of Encounter: 09/16/2016  Primary Cardiologist: Dr. Burt Knack  Subjective   Chest pain and DOE - walked down hall last pm and developed chest pressure and SOB had to stop and taken to room by wheelchair.  Morphine relieved. waking to BR has not caused pain.  Still with blood flecks when blows nose.   Inpatient Medications    Scheduled Meds: . amLODipine  5 mg Oral Daily  . aspirin EC  81 mg Oral Daily  . atorvastatin  80 mg Oral q1800  . carvedilol  25 mg Oral BID WC  . colchicine  0.6 mg Oral BID  . gabapentin  200 mg Oral TID  . guaiFENesin  1,200 mg Oral BID  . hydrALAZINE  50 mg Oral TID  . insulin aspart  0-5 Units Subcutaneous QHS  . insulin aspart  0-9 Units Subcutaneous TID WC  . insulin aspart  10 Units Subcutaneous TID WC  . insulin glargine  50 Units Subcutaneous Daily  . pantoprazole (PROTONIX) IV  40 mg Intravenous Q24H  . sodium chloride flush  3 mL Intravenous Q12H  . valACYclovir  500 mg Oral Daily   Continuous Infusions: . heparin 1,600 Units/hr (09/16/16 0203)  . nitroGLYCERIN Stopped (09/14/16 1900)   PRN Meds: albuterol, hydrALAZINE, ketotifen, morphine injection, traMADol   Vital Signs    Vitals:   09/15/16 1614 09/15/16 2004 09/16/16 0500 09/16/16 0721  BP: 132/66 132/60 128/70 (!) 156/81  Pulse: 71 83 79 77  Resp: 18 18 18 16   Temp: 98.5 F (36.9 C) 98 F (36.7 C) 98.5 F (36.9 C) 98.6 F (37 C)  TempSrc: Oral Oral Oral Oral  SpO2: 95% 94% 95% 96%  Weight:   (!) 301 lb 6.4 oz (136.7 kg)   Height:        Intake/Output Summary (Last 24 hours) at 09/16/16 0756 Last data filed at 09/16/16 0500  Gross per 24 hour  Intake             2046 ml  Output             2025 ml  Net               21 ml   Filed Weights   09/14/16 0403 09/15/16 0334 09/16/16 0500  Weight: 297 lb 12.8 oz (135.1 kg) (!) 300 lb 6.4 oz (136.3 kg) (!) 301 lb 6.4 oz (136.7 kg)    Telemetry    SR to Slight ST  -  Personally Reviewed  ECG    No new but new ant changes from last year. - Personally Reviewed  Physical Exam   GEN: No acute distress.   Neck: No JVD Cardiac: RRR, no murmurs, rubs, or gallops.  Respiratory: Clear to auscultation bilaterally. GI: Soft, nontender, non-distended  MS: No edema; No deformity. Neuro:  Nonfocal  Psych: Normal affect   Labs    Chemistry Recent Labs Lab 09/13/16 0418 09/14/16 0249 09/15/16 1136 09/16/16 0558  NA 139 137 137 136  K 4.1 4.0 4.1 4.1  CL 107 106 106 103  CO2 23 24 22  21*  GLUCOSE 259* 252* 149* 203*  BUN 30* 34* 37* 41*  CREATININE 1.73* 2.37* 2.27* 2.28*  CALCIUM 8.5* 8.1* 7.8* 7.8*  PROT 6.8 6.3*  --   --   ALBUMIN 3.4* 2.9*  --   --   AST 27 18  --   --   ALT 36 25  --   --  ALKPHOS 200* 152*  --   --   BILITOT 0.6 0.8  --   --   GFRNONAA 31* 21* 23* 22*  GFRAA 36* 25* 26* 26*  ANIONGAP 9 7 9 12      Hematology Recent Labs Lab 09/15/16 0328 09/16/16 0001 09/16/16 0558  WBC 8.1 7.4 PENDING  RBC 3.20* 3.17* 3.23*  HGB 9.0* 8.8* 9.0*  HCT 28.4* 28.3* 28.4*  MCV 88.8 89.3 87.9  MCH 28.1 27.8 27.9  MCHC 31.7 31.1 31.7  RDW 14.9 14.9 14.7  PLT 231 221 PENDING    Cardiac Enzymes Recent Labs Lab 09/13/16 1245 09/13/16 1526 09/13/16 2103 09/14/16 0249  TROPONINI 0.65* 0.82* 0.86* 0.88*    Recent Labs Lab 09/13/16 0421  TROPIPOC 0.42*     BNP Recent Labs Lab 09/13/16 0418 09/13/16 1245  BNP 430.9* 554.8*     DDimer No results for input(s): DDIMER in the last 168 hours.   Radiology    No results found.  Cardiac Studies    Echo 09/14/16: Left ventricle: Septal and apical hypokinesis The cavity size was normal. Systolic function was mildly reduced. The estimated ejection fraction was in the range of 45% to 50%. Wall motion was normal; there were no regional wall motion abnormalities. Doppler parameters are consistent with both elevated ventricular end-diastolic filling pressure and  elevated left atrial filling pressure. - Aortic valve: There was trivial regurgitation. Valve area (VTI): 1.38 cm^2. Valve area (Vmax): 1.24 cm^2. Valve area (Vmean): 1.27 cm^2. - Left atrium: The atrium was mildly dilated. - Atrial septum: No defect or patent foramen ovale was identified. - Pulmonary arteries: PA peak pressure: 38 mm Hg (S).  Patient Profile     61 y.o. female AA femalewithout known CAD with recent accelerated angina episodes presented again with recurrent hypertensive emergency and CHF episodes as well as elevated troponin  Assessment & Plan    1. Per Dr. Angelena Form yesterday "Chest pain/elevated troponin: Her pain is atypical, pleuritic but her echo shows new septal and apical wall motion abnormality with LVEF=45-50%. EKG without ischemic changes. Troponin elevated with flat trend. Cannot exclude acute coronary syndrome. She is currently on IV heparin. She is a poor cath candidate given her renal insufficiency but may have to consider inpatient cath given new wall motion abnormality on echo and elevated troponin. Cr today 2.28 and yesterday 2.27 down from 2.37 and pk of 1.82.     --+ angina and SOB with walking in hall, relief with morphine.  ? Cath tomorrow  2. DM: SSI. Blood sugar better controlled.  (108 to 201)  3. Acute on chronic kidney disease, stage 3: Based on her renal function over the next 24 hours will decide if ischemic evaluation should be with a cardiac cath or nuclear study.   4. Anemia of chronic disease: H/H stable.   5. HTN: BP controlled this am.    Signed, Cecilie Kicks, NP  09/16/2016, 7:56 AM    Patient seen, examined. Available data reviewed. Agree with findings, assessment, and plan as outlined by Cecilie Kicks, NP. Exam: A&O, NAD, lungs CTA, heart RRR without murmur, extremities with trace edema, otherwise as above. Pt with segmental LV dysfunction on echo, elevated troponin, and recurrent CP/dyspnea ambulating in hall. I think she  needs cardiac cath to evaluate for obstructive CAD in the setting of a highly suggestive clinical presentation. I have reviewed the risks, indications, and alternatives to cardiac catheterization, possible angioplasty, and stenting with the patient. Risks include but are not limited  to bleeding, infection, vascular injury, stroke, myocardial infection, arrhythmia, kidney injury, radiation-related injury in the case of prolonged fluoroscopy use, emergency cardiac surgery, and death. The patient understands the risks of serious complication is 1-2 in 1836 with diagnostic cardiac cath and 1-2% or less with angioplasty/stenting.   She understands the increased risk of contrast nephropathy with cath in the setting of chronic kidney disease. Creatinine is stable. She is on no ACE/ARB or nephrotoxins. Plan 'limited dye' diagnostic cath later this afternoon after hydration. She understands PCI will not be done today unless an emergent indication arises.   Sherren Mocha, M.D. 09/16/2016 10:58 AM

## 2016-09-17 ENCOUNTER — Encounter (HOSPITAL_COMMUNITY): Payer: Self-pay | Admitting: Cardiovascular Disease

## 2016-09-17 LAB — HEPARIN LEVEL (UNFRACTIONATED): Heparin Unfractionated: 0.54 IU/mL (ref 0.30–0.70)

## 2016-09-17 LAB — CBC
HCT: 32.4 % — ABNORMAL LOW (ref 36.0–46.0)
Hemoglobin: 10.2 g/dL — ABNORMAL LOW (ref 12.0–15.0)
MCH: 27.6 pg (ref 26.0–34.0)
MCHC: 31.5 g/dL (ref 30.0–36.0)
MCV: 87.8 fL (ref 78.0–100.0)
Platelets: 260 10*3/uL (ref 150–400)
RBC: 3.69 MIL/uL — ABNORMAL LOW (ref 3.87–5.11)
RDW: 15.1 % (ref 11.5–15.5)
WBC: 6.9 10*3/uL (ref 4.0–10.5)

## 2016-09-17 LAB — BASIC METABOLIC PANEL
Anion gap: 10 (ref 5–15)
BUN: 32 mg/dL — ABNORMAL HIGH (ref 6–20)
CO2: 22 mmol/L (ref 22–32)
Calcium: 8.4 mg/dL — ABNORMAL LOW (ref 8.9–10.3)
Chloride: 107 mmol/L (ref 101–111)
Creatinine, Ser: 1.65 mg/dL — ABNORMAL HIGH (ref 0.44–1.00)
GFR calc Af Amer: 39 mL/min — ABNORMAL LOW (ref >60)
GFR calc non Af Amer: 33 mL/min — ABNORMAL LOW (ref >60)
Glucose, Bld: 151 mg/dL — ABNORMAL HIGH (ref 65–99)
Potassium: 3.6 mmol/L (ref 3.5–5.1)
Sodium: 139 mmol/L (ref 135–145)

## 2016-09-17 LAB — GLUCOSE, CAPILLARY
Glucose-Capillary: 149 mg/dL — ABNORMAL HIGH (ref 65–99)
Glucose-Capillary: 217 mg/dL — ABNORMAL HIGH (ref 65–99)
Glucose-Capillary: 123 mg/dL — ABNORMAL HIGH (ref 65–99)
Glucose-Capillary: 194 mg/dL — ABNORMAL HIGH (ref 65–99)

## 2016-09-17 MED ORDER — ASPIRIN 81 MG PO CHEW
324.0000 mg | CHEWABLE_TABLET | ORAL | Status: AC
  Administered 2016-09-18: 324 mg via ORAL
  Filled 2016-09-17: qty 4

## 2016-09-17 MED ORDER — SODIUM CHLORIDE 0.9 % IV SOLN: 250.0000 mL | INTRAVENOUS | Status: DC | PRN

## 2016-09-17 MED ORDER — SODIUM CHLORIDE 0.9% FLUSH: 3.0000 mL | INTRAVENOUS | Status: DC | PRN

## 2016-09-17 MED ORDER — SODIUM CHLORIDE 0.9 % IV SOLN
INTRAVENOUS | Status: DC
  Administered 2016-09-18: 05:00:00 via INTRAVENOUS

## 2016-09-17 MED ORDER — FUROSEMIDE 10 MG/ML IJ SOLN
40.0000 mg | Freq: Once | INTRAMUSCULAR | Status: AC
  Administered 2016-09-17: 40 mg via INTRAVENOUS
  Filled 2016-09-17: qty 4

## 2016-09-17 MED ORDER — SODIUM CHLORIDE 0.9% FLUSH: 3.0000 mL | Freq: Two times a day (BID) | INTRAVENOUS | Status: DC

## 2016-09-17 NOTE — Progress Notes (Signed)
Pt c/o of SOB at rest. On evaluation, HOB was elevated to high fowlers, O2 sats 97% on RA. Lungs sounds clear/diminshed. Audible expiratory wheezes noted. PRN Albuterol neb tx given. Will continue to monitor.  Jacqlyn Larsen, RN

## 2016-09-17 NOTE — Progress Notes (Signed)
ANTICOAGULATION CONSULT NOTE - Follow Up Consult  Pharmacy Consult for Heparin Indication: chest pain/ACS   Assessment: Heparin gtt restarted post cath for significant coronary disease in LAD. Cards planning staged PCI after hydration and contrast clearance.  AM labs pending, no issues noted overnight.  Goal of Therapy:  Heparin level 0.3-0.7 units/ml Monitor platelets by anticoagulation protocol: Yes   Plan:  Continue heparin gtt at 1600 units/hr Daily heparin level and CBC Monitor for s/sx of bleeding  Allergies  Allergen Reactions  . Shellfish Allergy Anaphylaxis and Swelling  . Diazepam Other (See Comments)    "felt like out of body experience"  . Morphine And Related Itching    Patient Measurements: Height: 5\' 8"  (172.7 cm) Weight: 296 lb 9.6 oz (134.5 kg) IBW/kg (Calculated) : 63.9 Heparin Dosing Weight:  95.7 kg  Vital Signs: Temp: 97.8 F (36.6 C) (03/08 0334) Temp Source: Oral (03/08 0334) BP: 151/68 (03/08 0334) Pulse Rate: 74 (03/08 0334)  Labs:  Recent Labs  09/14/16 1200  09/15/16 0328 09/15/16 1136 09/16/16 0001 09/16/16 0558 09/16/16 1147  HGB  --   < > 9.0*  --  8.8* 9.0* 9.3*  HCT  --   < > 28.4*  --  28.3* 28.4* 30.4*  PLT  --   < > 231  --  221 243 229  LABPROT  --   --   --   --   --   --  14.2  INR  --   --   --   --   --   --  1.10  HEPARINUNFRC 0.46  --  0.59  --   --  0.59  --   CREATININE  --   --   --  2.27*  --  2.28*  --   < > = values in this interval not displayed.  Estimated Creatinine Clearance: 39.1 mL/min (by C-G formula based on SCr of 2.28 mg/dL (H)).  Erin Hearing PharmD., BCPS Clinical Pharmacist Pager [phone] 09/17/2016 8:33 AM

## 2016-09-17 NOTE — Progress Notes (Signed)
Progress Note  Patient Name: [name] Date of Encounter: 09/17/2016  Primary Cardiologist: [place]   Patient could not lie flat last night. She was too short of breath. Slept in a chair. No chest pain overnight. Cardiac catheterization notes reviewed.  Inpatient Medications    Scheduled Meds: . amLODipine  5 mg Oral Daily  . aspirin EC  81 mg Oral Daily  . atorvastatin  80 mg Oral q1800  . carvedilol  25 mg Oral BID WC  . colchicine  0.6 mg Oral BID  . gabapentin  200 mg Oral TID  . guaiFENesin  1,200 mg Oral BID  . hydrALAZINE  50 mg Oral TID  . insulin aspart  0-5 Units Subcutaneous QHS  . insulin aspart  0-9 Units Subcutaneous TID WC  . insulin aspart  10 Units Subcutaneous TID WC  . insulin glargine  50 Units Subcutaneous Daily  . pantoprazole  40 mg Oral Daily  . sodium chloride flush  3 mL Intravenous Q12H  . sodium chloride flush  3 mL Intravenous Q12H  . ticagrelor  90 mg Oral BID  . valACYclovir  500 mg Oral Daily   Continuous Infusions: . heparin 1,600 Units/hr (09/16/16 2328)  . nitroGLYCERIN Stopped (09/14/16 1900)   PRN Meds: sodium chloride, albuterol, hydrALAZINE, ketotifen, morphine injection, sodium chloride flush, traMADol   Vital Signs    Vitals:   09/17/16 0334 09/17/16 0846 09/17/16 0855 09/17/16 1113  BP: (!) 151/68 (!) 158/74  138/67  Pulse: 74 79    Resp:      Temp: 97.8 F (36.6 C)  97.8 F (36.6 C)   TempSrc: Oral  Oral   SpO2: 99%     Weight: 296 lb 9.6 oz (134.5 kg)     Height:        Intake/Output Summary (Last 24 hours) at 09/17/16 1323 Last data filed at 09/17/16 1245  Gross per 24 hour  Intake          1982.78 ml  Output             4000 ml  Net         -2017.22 ml   Filed Weights   09/15/16 0334 09/16/16 0500 09/17/16 0334  Weight: (!) 300 lb 6.4 oz (136.3 kg) (!) 301 lb 6.4 oz (136.7 kg) 296 lb 9.6 oz (134.5 kg)    Telemetry    Normal sinus rhythm - Personally Reviewed   Physical Exam    Pleasant obese woman in no distress GEN: No acute distress.   Neck: No JVD Cardiac: RRR, no murmurs, rubs, or gallops.  Respiratory: Clear to auscultation bilaterally. GI: Soft, nontender, non-distended  MS: No edema; No deformity. Right radial site clear Neuro:  Nonfocal  Psych: Normal affect   Labs    Chemistry Recent Labs Lab 09/13/16 0418 09/14/16 0249 09/15/16 1136 09/16/16 0558 09/17/16 0822  NA 139 137 137 136 139  K 4.1 4.0 4.1 4.1 3.6  CL 107 106 106 103 107  CO2 23 24 22  21* 22  GLUCOSE 259* 252* 149* 203* 151*  BUN 30* 34* 37* 41* 32*  CREATININE 1.73* 2.37* 2.27* 2.28* 1.65*  CALCIUM 8.5* 8.1* 7.8* 7.8* 8.4*  PROT 6.8 6.3*  --   --   --   ALBUMIN 3.4* 2.9*  --   --   --   AST 27 18  --   --   --   ALT 36 25  --   --   --  ALKPHOS 200* 152*  --   --   --   BILITOT 0.6 0.8  --   --   --   GFRNONAA 31* 21* 23* 22* 33*  GFRAA 36* 25* 26* 26* 39*  ANIONGAP 9 7 9 12 10      Hematology Recent Labs Lab 09/16/16 0558 09/16/16 1147 09/17/16 0822  WBC 9.0 7.1 6.9  RBC 3.23* 3.44* 3.69*  HGB 9.0* 9.3* 10.2*  HCT 28.4* 30.4* 32.4*  MCV 87.9 88.4 87.8  MCH 27.9 27.0 27.6  MCHC 31.7 30.6 31.5  RDW 14.7 14.7 15.1  PLT 243 229 260    Cardiac Enzymes Recent Labs Lab 09/13/16 1245 09/13/16 1526 09/13/16 2103 09/14/16 0249  TROPONINI 0.65* 0.82* 0.86* 0.88*    Recent Labs Lab 09/13/16 0421  TROPIPOC 0.42*     BNP Recent Labs Lab 09/13/16 0418 09/13/16 1245  BNP 430.9* 554.8*     DDimer No results for input(s): DDIMER in the last 168 hours.   Radiology    No results found.  Cardiac Studies   Cardiac Cath 09/16/2016: Conclusion     Ost 1st Mrg to 1st Mrg lesion, 50 %stenosed.  Prox LAD to Mid LAD lesion, 95 %stenosed.  Mid LAD lesion, 40 %stenosed.  Dist LAD lesion, 10 %stenosed.  Ost 2nd Mrg to 2nd Mrg lesion, 50 %stenosed.  Mid Cx lesion, 20 %stenosed.   1. Severe stenosis mid LAD 2. Moderate stenosis ostium of both OM1  and OM2.  3. Elevated filling pressures.   Recommendations: She will need staged PCI of the severe stenosis in the mid LAD given her renal insufficiency. Will hydrate gently next 24 hours. Will given one time dose of IV Lasix tonight with elevated filling pressures. Will load with Brilinta 180 mg po x 1 tonight then start Brilinta 90 mg po BID in the am. Continue ASA, statin and beta blocker. Resume IV heparin 6 hours post sheath pull. I will place her on the cath schedule for Friday 09/18/16.      Patient Profile     61 y.o. female AA femalewithout known CAD with recent accelerated angina episodes presented again with recurrent hypertensive emergency and CHF episodes as well as elevated troponin  Assessment & Plan    1. Non-ST elevation MI: Cardiac cath results reviewed. She has a tight proximal LAD stenosis. Plans for PCI tomorrow (staged PCI because of chronic kidney disease). The patient has been loaded with aspirin and brilinta. I have reviewed risks, indications, and alternatives with the patient and her sister who is at the bedside. She has been started on brilinta. While she needs to be hydrated appropriately for PCI in the setting of her chronic kidney disease, I think she is somewhat volume overloaded with orthopnea last night and will gently diurese her this afternoon with IV Lasix.  2. Type 2 diabetes: Continue insulin sliding scale regimen. Blood glucoses are reviewed.  3. Acute on chronic kidney disease stage III: Patient's renal function is improved based on today's creatinine value.  4. Hypertension: Blood pressure well controlled.  Disposition: Nothing by mouth after midnight, PCI tomorrow, hopefully discharge home Saturday morning.  Deatra James, MD  09/17/2016, 1:23 PM

## 2016-09-17 NOTE — Care Management Note (Signed)
Case Management Note  Patient Details  Name: [name] MRN: [id number] Date of Birth: Sep 17, 1955  Subjective/Objective:  Pt presented for increased SOB- Acute on Chronic CHF and uncontrolled hypertension. Pt is from home alone. Pt has a job, however no insurance. She has followed up at the [facility] in the past. [name] Liaison for ([facility] has scheduled patient a f/u appointment with the Clinic. Appointment placed on the AVS.                    Action/Plan: Pt is aware that she can get medications at the [facility] price ranges from $4.00-$10.00. Post cath on 09-16-16 revealed Severe stenosis mid LAD. Plan for PCI 09-18-16. CM will continue to monitor for additional needs.  Expected Discharge Date:                  Expected Discharge Plan:  Home/Self Care  In-House Referral:  NA  Discharge planning Services  CM Consult, Follow-up appt scheduled, [facility], Medication Assistance  Post Acute Care Choice:  NA Choice offered to:  NA  DME Arranged:  N/A DME Agency:  NA  HH Arranged:  NA HH Agency:  NA  Status of Service:  Completed, signed off  If discussed at [place] of Stay Meetings, dates discussed:    Additional Comments:  Bethena Roys, RN 09/17/2016, 10:44 AM

## 2016-09-18 ENCOUNTER — Encounter (HOSPITAL_COMMUNITY)
Admission: EM | Disposition: A | Discharge status: Home / Self Care | Payer: Self-pay | Source: Home / Self Care | Attending: Cardiovascular Disease

## 2016-09-18 HISTORY — PX: LEFT HEART CATH: CATH118248

## 2016-09-18 HISTORY — PX: CORONARY STENT INTERVENTION: CATH118234

## 2016-09-18 LAB — BASIC METABOLIC PANEL
Anion gap: 10 (ref 5–15)
BUN: 30 mg/dL — ABNORMAL HIGH (ref 6–20)
CO2: 21 mmol/L — ABNORMAL LOW (ref 22–32)
Calcium: 8.3 mg/dL — ABNORMAL LOW (ref 8.9–10.3)
Chloride: 108 mmol/L (ref 101–111)
Creatinine, Ser: 1.54 mg/dL — ABNORMAL HIGH (ref 0.44–1.00)
GFR calc Af Amer: 42 mL/min — ABNORMAL LOW (ref >60)
GFR calc non Af Amer: 36 mL/min — ABNORMAL LOW (ref >60)
Glucose, Bld: 171 mg/dL — ABNORMAL HIGH (ref 65–99)
Potassium: 3.3 mmol/L — ABNORMAL LOW (ref 3.5–5.1)
Sodium: 139 mmol/L (ref 135–145)

## 2016-09-18 LAB — GLUCOSE, CAPILLARY
Glucose-Capillary: 154 mg/dL — ABNORMAL HIGH (ref 65–99)
Glucose-Capillary: 172 mg/dL — ABNORMAL HIGH (ref 65–99)
Glucose-Capillary: 162 mg/dL — ABNORMAL HIGH (ref 65–99)
Glucose-Capillary: 195 mg/dL — ABNORMAL HIGH (ref 65–99)
Glucose-Capillary: 172 mg/dL — ABNORMAL HIGH (ref 65–99)

## 2016-09-18 LAB — CBC
HCT: 30.3 % — ABNORMAL LOW (ref 36.0–46.0)
Hemoglobin: 9.5 g/dL — ABNORMAL LOW (ref 12.0–15.0)
MCH: 27.4 pg (ref 26.0–34.0)
MCHC: 31.4 g/dL (ref 30.0–36.0)
MCV: 87.3 fL (ref 78.0–100.0)
Platelets: 239 10*3/uL (ref 150–400)
RBC: 3.47 MIL/uL — ABNORMAL LOW (ref 3.87–5.11)
RDW: 14.8 % (ref 11.5–15.5)
WBC: 6.3 10*3/uL (ref 4.0–10.5)

## 2016-09-18 LAB — HEPARIN LEVEL (UNFRACTIONATED): Heparin Unfractionated: 0.38 IU/mL (ref 0.30–0.70)

## 2016-09-18 LAB — POCT ACTIVATED CLOTTING TIME: Activated Clotting Time: 406 seconds

## 2016-09-18 SURGERY — CORONARY STENT INTERVENTION
Anesthesia: LOCAL

## 2016-09-18 MED ORDER — BIVALIRUDIN 250 MG IV SOLR
INTRAVENOUS | Status: AC
  Filled 2016-09-18: qty 250

## 2016-09-18 MED ORDER — HYDRALAZINE HCL 20 MG/ML IJ SOLN: 5.0000 mg | INTRAMUSCULAR | Status: AC | PRN

## 2016-09-18 MED ORDER — LABETALOL HCL 5 MG/ML IV SOLN: 10.0000 mg | INTRAVENOUS | Status: AC | PRN

## 2016-09-18 MED ORDER — IOPAMIDOL (ISOVUE-370) INJECTION 76%
INTRAVENOUS | Status: DC | PRN
  Administered 2016-09-18: 125 mL via INTRAVENOUS

## 2016-09-18 MED ORDER — NITROGLYCERIN IN D5W 200-5 MCG/ML-% IV SOLN
INTRAVENOUS | Status: DC | PRN
  Administered 2016-09-18: 10 ug/min via INTRAVENOUS

## 2016-09-18 MED ORDER — POTASSIUM CHLORIDE CRYS ER 20 MEQ PO TBCR
40.0000 meq | EXTENDED_RELEASE_TABLET | Freq: Once | ORAL | Status: AC
  Administered 2016-09-18: 40 meq via ORAL
  Filled 2016-09-18: qty 2

## 2016-09-18 MED ORDER — MIDAZOLAM HCL 2 MG/2ML IJ SOLN
INTRAMUSCULAR | Status: DC | PRN
  Administered 2016-09-18: 1 mg via INTRAVENOUS
  Administered 2016-09-18: 2 mg via INTRAVENOUS
  Administered 2016-09-18 (×3): 1 mg via INTRAVENOUS

## 2016-09-18 MED ORDER — IOPAMIDOL (ISOVUE-370) INJECTION 76%
INTRAVENOUS | Status: AC
  Filled 2016-09-18: qty 50

## 2016-09-18 MED ORDER — HEPARIN (PORCINE) IN NACL 2-0.9 UNIT/ML-% IJ SOLN
INTRAMUSCULAR | Status: DC | PRN
  Administered 2016-09-18: 1000 mL

## 2016-09-18 MED ORDER — SODIUM CHLORIDE 0.9% FLUSH
3.0000 mL | Freq: Two times a day (BID) | INTRAVENOUS | Status: DC
  Administered 2016-09-18 (×2): 3 mL via INTRAVENOUS

## 2016-09-18 MED ORDER — SODIUM CHLORIDE 0.9% FLUSH: 3.0000 mL | INTRAVENOUS | Status: DC | PRN

## 2016-09-18 MED ORDER — NITROGLYCERIN IN D5W 200-5 MCG/ML-% IV SOLN
0.0000 ug/min | INTRAVENOUS | Status: DC
  Administered 2016-09-18: 10 ug/min via INTRAVENOUS

## 2016-09-18 MED ORDER — SODIUM CHLORIDE 0.9 % IV SOLN
INTRAVENOUS | Status: DC | PRN
  Administered 2016-09-18: 10:00:00
  Administered 2016-09-18: 1.75 mg/kg/h via INTRAVENOUS

## 2016-09-18 MED ORDER — MIDAZOLAM HCL 2 MG/2ML IJ SOLN
INTRAMUSCULAR | Status: AC
  Filled 2016-09-18: qty 2

## 2016-09-18 MED ORDER — NITROGLYCERIN 1 MG/10 ML FOR IR/CATH LAB
INTRA_ARTERIAL | Status: DC | PRN
  Administered 2016-09-18: 200 ug via INTRA_ARTERIAL
  Administered 2016-09-18 (×2): 200 ug via INTRACORONARY

## 2016-09-18 MED ORDER — SODIUM CHLORIDE 0.9 % IV SOLN: 250.0000 mL | INTRAVENOUS | Status: DC | PRN

## 2016-09-18 MED ORDER — NITROGLYCERIN 1 MG/10 ML FOR IR/CATH LAB
INTRA_ARTERIAL | Status: AC
  Filled 2016-09-18: qty 10

## 2016-09-18 MED ORDER — NITROGLYCERIN IN D5W 200-5 MCG/ML-% IV SOLN
INTRAVENOUS | Status: AC
  Filled 2016-09-18: qty 250

## 2016-09-18 MED ORDER — FENTANYL CITRATE (PF) 100 MCG/2ML IJ SOLN
INTRAMUSCULAR | Status: AC
Start: 2016-09-18 — End: 2016-09-18
  Filled 2016-09-18: qty 2

## 2016-09-18 MED ORDER — VERAPAMIL HCL 2.5 MG/ML IV SOLN
INTRAVENOUS | Status: AC
  Filled 2016-09-18: qty 2

## 2016-09-18 MED ORDER — LIDOCAINE HCL (PF) 1 % IJ SOLN
INTRAMUSCULAR | Status: DC | PRN
  Administered 2016-09-18: 2 mL via INTRADERMAL

## 2016-09-18 MED ORDER — BIVALIRUDIN BOLUS VIA INFUSION - CUPID
INTRAVENOUS | Status: DC | PRN
  Administered 2016-09-18: 99.6 mg via INTRAVENOUS

## 2016-09-18 MED ORDER — SODIUM CHLORIDE 0.9 % IV SOLN
INTRAVENOUS | Status: AC
  Administered 2016-09-18: 12:00:00 via INTRAVENOUS

## 2016-09-18 MED ORDER — SODIUM CHLORIDE 0.9 % IV SOLN
INTRAVENOUS | Status: AC
  Administered 2016-09-18: 500 mL via INTRAVENOUS

## 2016-09-18 MED ORDER — VERAPAMIL HCL 2.5 MG/ML IV SOLN
INTRAVENOUS | Status: DC | PRN
  Administered 2016-09-18 (×2): 10 mL via INTRA_ARTERIAL

## 2016-09-18 MED ORDER — LIDOCAINE HCL (PF) 1 % IJ SOLN
INTRAMUSCULAR | Status: AC
  Filled 2016-09-18: qty 30

## 2016-09-18 MED ORDER — ATORVASTATIN CALCIUM 80 MG PO TABS: 80.0000 mg | ORAL_TABLET | Freq: Every day | ORAL | Status: DC

## 2016-09-18 MED ORDER — ASPIRIN 81 MG PO CHEW: 81.0000 mg | CHEWABLE_TABLET | Freq: Every day | ORAL | Status: DC

## 2016-09-18 MED ORDER — FENTANYL CITRATE (PF) 100 MCG/2ML IJ SOLN
INTRAMUSCULAR | Status: DC | PRN
  Administered 2016-09-18 (×3): 25 ug via INTRAVENOUS
  Administered 2016-09-18: 50 ug via INTRAVENOUS
  Administered 2016-09-18: 25 ug via INTRAVENOUS
  Administered 2016-09-18: 50 ug via INTRAVENOUS

## 2016-09-18 MED ORDER — ACETAMINOPHEN 325 MG PO TABS: 650.0000 mg | ORAL_TABLET | ORAL | Status: DC | PRN

## 2016-09-18 MED ORDER — IOPAMIDOL (ISOVUE-370) INJECTION 76%
INTRAVENOUS | Status: AC
  Filled 2016-09-18: qty 125

## 2016-09-18 MED ORDER — ONDANSETRON HCL 4 MG/2ML IJ SOLN: 4.0000 mg | Freq: Four times a day (QID) | INTRAMUSCULAR | Status: DC | PRN

## 2016-09-18 MED ORDER — TICAGRELOR 90 MG PO TABS: 90.0000 mg | ORAL_TABLET | Freq: Two times a day (BID) | ORAL | Status: DC

## 2016-09-18 MED ORDER — HEPARIN (PORCINE) IN NACL 2-0.9 UNIT/ML-% IJ SOLN
INTRAMUSCULAR | Status: AC
  Filled 2016-09-18: qty 1000

## 2016-09-18 MED ORDER — FENTANYL CITRATE (PF) 100 MCG/2ML IJ SOLN
INTRAMUSCULAR | Status: AC
  Filled 2016-09-18: qty 2

## 2016-09-18 SURGICAL SUPPLY — 21 items
BALLN ANGIOSCULPT RX 2.0X10 (BALLOONS) ×2
BALLN ~~LOC~~ EUPHORA RX 3.25X20 (BALLOONS) ×2
BALLOON ANGIOSCULPT RX 2.0X10 (BALLOONS) IMPLANT
BALLOON ~~LOC~~ EUPHORA RX 3.25X20 (BALLOONS) IMPLANT
CATH EXPO 5FR ANG PIGTAIL 145 (CATHETERS) ×2 IMPLANT
CATH EXPO 5FR FR4 (CATHETERS) ×1 IMPLANT
COVER PRB 48X5XTLSCP FOLD TPE (BAG) IMPLANT
COVER PROBE 5X48 (BAG) ×2
DEVICE RAD COMP TR BAND LRG (VASCULAR PRODUCTS) ×1 IMPLANT
GLIDESHEATH SLEND SS 6F .021 (SHEATH) ×1 IMPLANT
GUIDE CATH RUNWAY 6FR CLS3.5 (CATHETERS) ×2 IMPLANT
GUIDEWIRE INQWIRE 1.5J.035X260 (WIRE) IMPLANT
HOVERMATT SINGLE USE (MISCELLANEOUS) ×1 IMPLANT
INQWIRE 1.5J .035X260CM (WIRE) ×2
KIT ENCORE 26 ADVANTAGE (KITS) ×1 IMPLANT
KIT HEART LEFT (KITS) ×2 IMPLANT
PACK CARDIAC CATHETERIZATION (CUSTOM PROCEDURE TRAY) ×2 IMPLANT
STENT SYNERGY DES 3X32 (Permanent Stent) ×1 IMPLANT
TRANSDUCER W/STOPCOCK (MISCELLANEOUS) ×2 IMPLANT
TUBING CIL FLEX 10 FLL-RA (TUBING) ×2 IMPLANT
WIRE COUGAR XT STRL 190CM (WIRE) ×1 IMPLANT

## 2016-09-18 NOTE — H&P (View-Only) (Signed)
Progress Note  Patient Name: [name] Date of Encounter: 09/18/2016  Primary Cardiologist: Burt Knack  Subjective   Feels better. Was able to sleep in bed for part of the night without dyspnea. No recurrent CP. Has had some epistaxis on IV heparin.  Inpatient Medications    Scheduled Meds: . amLODipine  5 mg Oral Daily  . aspirin EC  81 mg Oral Daily  . atorvastatin  80 mg Oral q1800  . carvedilol  25 mg Oral BID WC  . colchicine  0.6 mg Oral BID  . gabapentin  200 mg Oral TID  . guaiFENesin  1,200 mg Oral BID  . hydrALAZINE  50 mg Oral TID  . insulin aspart  0-5 Units Subcutaneous QHS  . insulin aspart  0-9 Units Subcutaneous TID WC  . insulin aspart  10 Units Subcutaneous TID WC  . insulin glargine  50 Units Subcutaneous Daily  . pantoprazole  40 mg Oral Daily  . potassium chloride  40 mEq Oral Once  . ticagrelor  90 mg Oral BID  . valACYclovir  500 mg Oral Daily   Continuous Infusions: . sodium chloride 100 mL/hr at 09/18/16 0527  . heparin 1,600 Units/hr (09/17/16 1717)  . nitroGLYCERIN Stopped (09/14/16 1900)   PRN Meds: sodium chloride, albuterol, hydrALAZINE, ketotifen, morphine injection, traMADol   Vital Signs    Vitals:   09/17/16 1819 09/17/16 2100 09/18/16 0024 09/18/16 0300  BP: (!) 150/60 (!) 144/104 (!) 152/73 (!) 152/70  Pulse:  75 82 73  Resp:    20  Temp:  98.5 F (36.9 C) 98 F (36.7 C) 98.4 F (36.9 C)  TempSrc:  Oral Oral Oral  SpO2:  96% 97% 97%  Weight:    292 lb 12.8 oz (132.8 kg)  Height:        Intake/Output Summary (Last 24 hours) at 09/18/16 0755 Last data filed at 09/18/16 0600  Gross per 24 hour  Intake             1087 ml  Output              900 ml  Net              187 ml   Filed Weights   09/16/16 0500 09/17/16 0334 09/18/16 0300  Weight: (!) 301 lb 6.4 oz (136.7 kg) 296 lb 9.6 oz (134.5 kg) 292 lb 12.8 oz (132.8 kg)    Telemetry    Sinus rhythm - Personally Reviewed    Physical Exam  Pleasant obese woman  in NAD GEN: No acute distress.   Neck: No JVD Cardiac: RRR, 2/6 SEM at the LSB  Respiratory: Clear to auscultation bilaterally. GI: Soft, nontender, non-distended  MS: Trace pretibial edema; No deformity. Neuro:  Nonfocal  Psych: Normal affect   Labs    Chemistry Recent Labs Lab 09/13/16 0418 09/14/16 0249  09/16/16 0558 09/17/16 0822 09/18/16 0436  NA 139 137  < > 136 139 139  K 4.1 4.0  < > 4.1 3.6 3.3*  CL 107 106  < > 103 107 108  CO2 23 24  < > 21* 22 21*  GLUCOSE 259* 252*  < > 203* 151* 171*  BUN 30* 34*  < > 41* 32* 30*  CREATININE 1.73* 2.37*  < > 2.28* 1.65* 1.54*  CALCIUM 8.5* 8.1*  < > 7.8* 8.4* 8.3*  PROT 6.8 6.3*  --   --   --   --   ALBUMIN 3.4*  2.9*  --   --   --   --   AST 27 18  --   --   --   --   ALT 36 25  --   --   --   --   ALKPHOS 200* 152*  --   --   --   --   BILITOT 0.6 0.8  --   --   --   --   GFRNONAA 31* 21*  < > 22* 33* 36*  GFRAA 36* 25*  < > 26* 39* 42*  ANIONGAP 9 7  < > 12 10 10   < > = values in this interval not displayed.   Hematology Recent Labs Lab 09/16/16 1147 09/17/16 0822 09/18/16 0436  WBC 7.1 6.9 6.3  RBC 3.44* 3.69* 3.47*  HGB 9.3* 10.2* 9.5*  HCT 30.4* 32.4* 30.3*  MCV 88.4 87.8 87.3  MCH 27.0 27.6 27.4  MCHC 30.6 31.5 31.4  RDW 14.7 15.1 14.8  PLT 229 260 239    Cardiac Enzymes Recent Labs Lab 09/13/16 1245 09/13/16 1526 09/13/16 2103 09/14/16 0249  TROPONINI 0.65* 0.82* 0.86* 0.88*    Recent Labs Lab 09/13/16 0421  TROPIPOC 0.42*     BNP Recent Labs Lab 09/13/16 0418 09/13/16 1245  BNP 430.9* 554.8*     DDimer No results for input(s): DDIMER in the last 168 hours.   Radiology    No results found.  Cardiac Studies   Cardiac Cath 09/16/2016: Conclusion     Ost 1st Mrg to 1st Mrg lesion, 50 %stenosed.  Prox LAD to Mid LAD lesion, 95 %stenosed.  Mid LAD lesion, 40 %stenosed.  Dist LAD lesion, 10 %stenosed.  Ost 2nd Mrg to 2nd Mrg lesion, 50 %stenosed.  Mid Cx lesion, 20  %stenosed.  1. Severe stenosis mid LAD 2. Moderate stenosis ostium of both OM1 and OM2.  3. Elevated filling pressures.   Recommendations: She will need staged PCI of the severe stenosis in the mid LAD given her renal insufficiency. Will hydrate gently next 24 hours. Will given one time dose of IV Lasix tonight with elevated filling pressures. Will load with Brilinta 180 mg po x 1 tonight then start Brilinta 90 mg po BID in the am. Continue ASA, statin and beta blocker. Resume IV heparin 6 hours post sheath pull. I will place her on the cath schedule for Friday 09/18/16.      Patient Profile     61 y.o. female without known CAD with recent accelerated angina episodes presented again with recurrent hypertensive emergency and CHF episodes as well as elevated troponin  Assessment & Plan    1. Non-ST elevation MI: Cardiac cath results reviewed. She has a tight proximal LAD stenosis. Plans for PCI today (staged PCI because of chronic kidney disease). The patient has been loaded with aspirin and brilinta. I have reviewed risks, indications, and alternatives with the patient.  2. Type 2 diabetes: Continue insulin sliding scale regimen. Blood glucoses are reviewed. CBG 123-217.  3. Acute on chronic kidney disease stage III: Patient's renal function is stable  4. Hypertension: Blood pressure well controlled.  5. Acute on chronic systolic and diastolic heart failure: good diuresis yesterday with IV lasix. Check LVEDP at cath today to help determine post-PCI fluids. Continue amlodipine, hydralazine, carvedilol. No ACE/ARB in setting of CKD and contrast studies secondary to risk of further AKI.  Disposition: Nothing by mouth after midnight, PCI today, hopefully discharge home Saturday morning if renal fxn stable.  Deatra James, MD  09/18/2016, 7:55 AM

## 2016-09-18 NOTE — Progress Notes (Addendum)
Pt. Stated she would notify if she decided to wear her cpap.

## 2016-09-18 NOTE — Progress Notes (Signed)
TR BAND REMOVAL  LOCATION:  right radial  DEFLATED PER PROTOCOL:  Yes.    TIME BAND OFF / DRESSING APPLIED:   1700   SITE UPON ARRIVAL:   Level 0  SITE AFTER BAND REMOVAL:  Level 0  CIRCULATION SENSATION AND MOVEMENT:  Within Normal Limits  Yes.    COMMENTS:    

## 2016-09-18 NOTE — Progress Notes (Signed)
Pt refuse CPAP for the night.

## 2016-09-18 NOTE — Interval H&P Note (Signed)
History and Physical Interval Note:  09/18/2016 8:45 AM  [name]  has presented today for surgery, with the diagnosis of cad  The various methods of treatment have been discussed with the patient and family. After consideration of risks, benefits and other options for treatment, the patient has consented to  Procedure(s): Coronary Stent Intervention (N/A) as a surgical intervention .  The patient's history has been reviewed, patient examined, no change in status, stable for surgery.  I have reviewed the patient's chart and labs.  Questions were answered to the patient's satisfaction.     Shelva Majestic

## 2016-09-18 NOTE — Progress Notes (Addendum)
Progress Note  Patient Name: [name] Date of Encounter: 09/18/2016  Primary Cardiologist: Burt Knack  Subjective   Feels better. Was able to sleep in bed for part of the night without dyspnea. No recurrent CP. Has had some epistaxis on IV heparin.  Inpatient Medications    Scheduled Meds: . amLODipine  5 mg Oral Daily  . aspirin EC  81 mg Oral Daily  . atorvastatin  80 mg Oral q1800  . carvedilol  25 mg Oral BID WC  . colchicine  0.6 mg Oral BID  . gabapentin  200 mg Oral TID  . guaiFENesin  1,200 mg Oral BID  . hydrALAZINE  50 mg Oral TID  . insulin aspart  0-5 Units Subcutaneous QHS  . insulin aspart  0-9 Units Subcutaneous TID WC  . insulin aspart  10 Units Subcutaneous TID WC  . insulin glargine  50 Units Subcutaneous Daily  . pantoprazole  40 mg Oral Daily  . potassium chloride  40 mEq Oral Once  . ticagrelor  90 mg Oral BID  . valACYclovir  500 mg Oral Daily   Continuous Infusions: . sodium chloride 100 mL/hr at 09/18/16 0527  . heparin 1,600 Units/hr (09/17/16 1717)  . nitroGLYCERIN Stopped (09/14/16 1900)   PRN Meds: sodium chloride, albuterol, hydrALAZINE, ketotifen, morphine injection, traMADol   Vital Signs    Vitals:   09/17/16 1819 09/17/16 2100 09/18/16 0024 09/18/16 0300  BP: (!) 150/60 (!) 144/104 (!) 152/73 (!) 152/70  Pulse:  75 82 73  Resp:    20  Temp:  98.5 F (36.9 C) 98 F (36.7 C) 98.4 F (36.9 C)  TempSrc:  Oral Oral Oral  SpO2:  96% 97% 97%  Weight:    292 lb 12.8 oz (132.8 kg)  Height:        Intake/Output Summary (Last 24 hours) at 09/18/16 0755 Last data filed at 09/18/16 0600  Gross per 24 hour  Intake             1087 ml  Output              900 ml  Net              187 ml   Filed Weights   09/16/16 0500 09/17/16 0334 09/18/16 0300  Weight: (!) 301 lb 6.4 oz (136.7 kg) 296 lb 9.6 oz (134.5 kg) 292 lb 12.8 oz (132.8 kg)    Telemetry    Sinus rhythm - Personally Reviewed    Physical Exam  Pleasant obese woman  in NAD GEN: No acute distress.   Neck: No JVD Cardiac: RRR, 2/6 SEM at the LSB  Respiratory: Clear to auscultation bilaterally. GI: Soft, nontender, non-distended  MS: Trace pretibial edema; No deformity. Neuro:  Nonfocal  Psych: Normal affect   Labs    Chemistry Recent Labs Lab 09/13/16 0418 09/14/16 0249  09/16/16 0558 09/17/16 0822 09/18/16 0436  NA 139 137  < > 136 139 139  K 4.1 4.0  < > 4.1 3.6 3.3*  CL 107 106  < > 103 107 108  CO2 23 24  < > 21* 22 21*  GLUCOSE 259* 252*  < > 203* 151* 171*  BUN 30* 34*  < > 41* 32* 30*  CREATININE 1.73* 2.37*  < > 2.28* 1.65* 1.54*  CALCIUM 8.5* 8.1*  < > 7.8* 8.4* 8.3*  PROT 6.8 6.3*  --   --   --   --   ALBUMIN 3.4*  2.9*  --   --   --   --   AST 27 18  --   --   --   --   ALT 36 25  --   --   --   --   ALKPHOS 200* 152*  --   --   --   --   BILITOT 0.6 0.8  --   --   --   --   GFRNONAA 31* 21*  < > 22* 33* 36*  GFRAA 36* 25*  < > 26* 39* 42*  ANIONGAP 9 7  < > 12 10 10   < > = values in this interval not displayed.   Hematology Recent Labs Lab 09/16/16 1147 09/17/16 0822 09/18/16 0436  WBC 7.1 6.9 6.3  RBC 3.44* 3.69* 3.47*  HGB 9.3* 10.2* 9.5*  HCT 30.4* 32.4* 30.3*  MCV 88.4 87.8 87.3  MCH 27.0 27.6 27.4  MCHC 30.6 31.5 31.4  RDW 14.7 15.1 14.8  PLT 229 260 239    Cardiac Enzymes Recent Labs Lab 09/13/16 1245 09/13/16 1526 09/13/16 2103 09/14/16 0249  TROPONINI 0.65* 0.82* 0.86* 0.88*    Recent Labs Lab 09/13/16 0421  TROPIPOC 0.42*     BNP Recent Labs Lab 09/13/16 0418 09/13/16 1245  BNP 430.9* 554.8*     DDimer No results for input(s): DDIMER in the last 168 hours.   Radiology    No results found.  Cardiac Studies   Cardiac Cath 09/16/2016: Conclusion     Ost 1st Mrg to 1st Mrg lesion, 50 %stenosed.  Prox LAD to Mid LAD lesion, 95 %stenosed.  Mid LAD lesion, 40 %stenosed.  Dist LAD lesion, 10 %stenosed.  Ost 2nd Mrg to 2nd Mrg lesion, 50 %stenosed.  Mid Cx lesion, 20  %stenosed.  1. Severe stenosis mid LAD 2. Moderate stenosis ostium of both OM1 and OM2.  3. Elevated filling pressures.   Recommendations: She will need staged PCI of the severe stenosis in the mid LAD given her renal insufficiency. Will hydrate gently next 24 hours. Will given one time dose of IV Lasix tonight with elevated filling pressures. Will load with Brilinta 180 mg po x 1 tonight then start Brilinta 90 mg po BID in the am. Continue ASA, statin and beta blocker. Resume IV heparin 6 hours post sheath pull. I will place her on the cath schedule for Friday 09/18/16.      Patient Profile     61 y.o. female without known CAD with recent accelerated angina episodes presented again with recurrent hypertensive emergency and CHF episodes as well as elevated troponin  Assessment & Plan    1. Non-ST elevation MI: Cardiac cath results reviewed. She has a tight proximal LAD stenosis. Plans for PCI today (staged PCI because of chronic kidney disease). The patient has been loaded with aspirin and brilinta. I have reviewed risks, indications, and alternatives with the patient.  2. Type 2 diabetes, uncontrolled >> Hyperglycemia with CKD 3: Continue insulin sliding scale regimen. Blood glucoses are reviewed. CBG 123-217.  3. Acute on chronic kidney disease stage III: Patient's renal function is stable  4. Hypertension: Blood pressure well controlled.  5. Acute on chronic systolic and diastolic heart failure: good diuresis yesterday with IV lasix. Check LVEDP at cath today to help determine post-PCI fluids. Continue amlodipine, hydralazine, carvedilol. No ACE/ARB in setting of CKD and contrast studies secondary to risk of further AKI.  Disposition: Nothing by mouth after midnight, PCI today, hopefully discharge home Saturday  morning if renal fxn stable.   Deatra James, MD  09/18/2016, 7:55 AM

## 2016-09-19 ENCOUNTER — Encounter (HOSPITAL_COMMUNITY): Payer: Self-pay | Admitting: Student

## 2016-09-19 DIAGNOSIS — R9431 Abnormal electrocardiogram [ECG] [EKG]: Secondary | ICD-10-CM

## 2016-09-19 DIAGNOSIS — Z955 Presence of coronary angioplasty implant and graft: Secondary | ICD-10-CM

## 2016-09-19 LAB — BASIC METABOLIC PANEL
Anion gap: 12 (ref 5–15)
BUN: 31 mg/dL — ABNORMAL HIGH (ref 6–20)
CO2: 17 mmol/L — ABNORMAL LOW (ref 22–32)
Calcium: 7.7 mg/dL — ABNORMAL LOW (ref 8.9–10.3)
Chloride: 108 mmol/L (ref 101–111)
Creatinine, Ser: 1.87 mg/dL — ABNORMAL HIGH (ref 0.44–1.00)
GFR calc Af Amer: 33 mL/min — ABNORMAL LOW (ref >60)
GFR calc non Af Amer: 29 mL/min — ABNORMAL LOW (ref >60)
Glucose, Bld: 217 mg/dL — ABNORMAL HIGH (ref 65–99)
Potassium: 4 mmol/L (ref 3.5–5.1)
Sodium: 137 mmol/L (ref 135–145)

## 2016-09-19 LAB — CBC
HCT: 28.8 % — ABNORMAL LOW (ref 36.0–46.0)
Hemoglobin: 8.9 g/dL — ABNORMAL LOW (ref 12.0–15.0)
MCH: 27.3 pg (ref 26.0–34.0)
MCHC: 30.9 g/dL (ref 30.0–36.0)
MCV: 88.3 fL (ref 78.0–100.0)
Platelets: 227 10*3/uL (ref 150–400)
RBC: 3.26 MIL/uL — ABNORMAL LOW (ref 3.87–5.11)
RDW: 15.1 % (ref 11.5–15.5)
WBC: 5.7 10*3/uL (ref 4.0–10.5)

## 2016-09-19 LAB — GLUCOSE, CAPILLARY: Glucose-Capillary: 213 mg/dL — ABNORMAL HIGH (ref 65–99)

## 2016-09-19 MED ORDER — TICAGRELOR 90 MG PO TABS: 90.0000 mg | ORAL_TABLET | Freq: Two times a day (BID) | ORAL | 0 refills | Status: DC

## 2016-09-19 MED ORDER — ASPIRIN 81 MG PO TBEC: 81.0000 mg | DELAYED_RELEASE_TABLET | Freq: Every day | ORAL | Status: DC

## 2016-09-19 MED ORDER — AMLODIPINE BESYLATE 5 MG PO TABS: 5.0000 mg | ORAL_TABLET | Freq: Every day | ORAL | 11 refills | Status: DC

## 2016-09-19 MED ORDER — TRAMADOL HCL 50 MG PO TABS: 50.0000 mg | ORAL_TABLET | Freq: Four times a day (QID) | ORAL | 0 refills | Status: DC | PRN

## 2016-09-19 MED ORDER — ATORVASTATIN CALCIUM 80 MG PO TABS: 80.0000 mg | ORAL_TABLET | Freq: Every day | ORAL | 11 refills | Status: DC

## 2016-09-19 MED ORDER — TICAGRELOR 90 MG PO TABS: 90.0000 mg | ORAL_TABLET | Freq: Two times a day (BID) | ORAL | 11 refills | Status: DC

## 2016-09-19 NOTE — Progress Notes (Signed)
Progress Note  Patient Name: [name] Date of Encounter: 09/19/2016  Primary Cardiologist: Dr. Burt Knack  Subjective   Reports a mild discomfort in her chest yesterday afternoon. No recurrent symptoms overnight or this AM. Ambulating around the room without difficulty.   Inpatient Medications    Scheduled Meds: . amLODipine  5 mg Oral Daily  . aspirin EC  81 mg Oral Daily  . atorvastatin  80 mg Oral q1800  . carvedilol  25 mg Oral BID WC  . colchicine  0.6 mg Oral BID  . gabapentin  200 mg Oral TID  . guaiFENesin  1,200 mg Oral BID  . hydrALAZINE  50 mg Oral TID  . insulin aspart  0-5 Units Subcutaneous QHS  . insulin aspart  0-9 Units Subcutaneous TID WC  . insulin aspart  10 Units Subcutaneous TID WC  . insulin glargine  50 Units Subcutaneous Daily  . pantoprazole  40 mg Oral Daily  . sodium chloride flush  3 mL Intravenous Q12H  . ticagrelor  90 mg Oral BID  . valACYclovir  500 mg Oral Daily   Continuous Infusions: . nitroGLYCERIN 3 mcg/min (09/19/16 0400)   PRN Meds: sodium chloride, sodium chloride, acetaminophen, albuterol, hydrALAZINE, ketotifen, morphine injection, ondansetron (ZOFRAN) IV, sodium chloride flush, traMADol   Vital Signs    Vitals:   09/18/16 2248 09/19/16 0200 09/19/16 0402 09/19/16 0700  BP: (!) 156/67 (!) 137/59 (!) 153/71 (!) 171/68  Pulse: 77 68 68 77  Resp: 18 14 14 14   Temp:   98 F (36.7 C) 98.1 F (36.7 C)  TempSrc:   Oral Oral  SpO2: 98% 99% 97% 96%  Weight:   297 lb 13.5 oz (135.1 kg)   Height:        Intake/Output Summary (Last 24 hours) at 09/19/16 0711 Last data filed at 09/19/16 0600  Gross per 24 hour  Intake           2132.5 ml  Output             2001 ml  Net            131.5 ml   Filed Weights   09/17/16 0334 09/18/16 0300 09/19/16 0402  Weight: 296 lb 9.6 oz (134.5 kg) 292 lb 12.8 oz (132.8 kg) 297 lb 13.5 oz (135.1 kg)    Telemetry    NSR, HR 70's - 80's. Occasional PVC's.  - Personally Reviewed  ECG      NSR, HR 76, with anterolateral TWI. Prolonged QTc of 567. - Personally Reviewed  Physical Exam   General: Well developed, well nourished overweight African American female appearing in no acute distress. Head: Normocephalic, atraumatic.  Neck: Supple without bruits, JVD not elevated. Lungs:  Resp regular and unlabored, CTA without wheezing or rales. Heart: RRR, S1, S2, no S3, S4, or murmur; no rub. Abdomen: Soft, non-tender, non-distended with normoactive bowel sounds. No hepatomegaly. No rebound/guarding. No obvious abdominal masses. Extremities: No clubbing or cyanosis. Trace lower extremity edema. Distal pedal pulses are 2+ bilaterally. Right radial cath site without evidence of a hematoma.  Neuro: Alert and oriented X 3. Moves all extremities spontaneously. Psych: Normal affect.  Labs    Chemistry Recent Labs Lab 09/13/16 0418 09/14/16 0249  09/17/16 0822 09/18/16 0436 09/19/16 0415  NA 139 137  < > 139 139 137  K 4.1 4.0  < > 3.6 3.3* 4.0  CL 107 106  < > 107 108 108  CO2 23 24  < >  22 21* 17*  GLUCOSE 259* 252*  < > 151* 171* 217*  BUN 30* 34*  < > 32* 30* 31*  CREATININE 1.73* 2.37*  < > 1.65* 1.54* 1.87*  CALCIUM 8.5* 8.1*  < > 8.4* 8.3* 7.7*  PROT 6.8 6.3*  --   --   --   --   ALBUMIN 3.4* 2.9*  --   --   --   --   AST 27 18  --   --   --   --   ALT 36 25  --   --   --   --   ALKPHOS 200* 152*  --   --   --   --   BILITOT 0.6 0.8  --   --   --   --   GFRNONAA 31* 21*  < > 33* 36* 29*  GFRAA 36* 25*  < > 39* 42* 33*  ANIONGAP 9 7  < > 10 10 12   < > = values in this interval not displayed.   Hematology Recent Labs Lab 09/17/16 0822 09/18/16 0436 09/19/16 0415  WBC 6.9 6.3 5.7  RBC 3.69* 3.47* 3.26*  HGB 10.2* 9.5* 8.9*  HCT 32.4* 30.3* 28.8*  MCV 87.8 87.3 88.3  MCH 27.6 27.4 27.3  MCHC 31.5 31.4 30.9  RDW 15.1 14.8 15.1  PLT 260 239 227    Cardiac Enzymes Recent Labs Lab 09/13/16 1245 09/13/16 1526 09/13/16 2103 09/14/16 0249  TROPONINI  0.65* 0.82* 0.86* 0.88*    Recent Labs Lab 09/13/16 0421  TROPIPOC 0.42*     BNP Recent Labs Lab 09/13/16 0418 09/13/16 1245  BNP 430.9* 554.8*     DDimer No results for input(s): DDIMER in the last 168 hours.   Radiology    No results found.  Cardiac Studies   Cardiac Catheterization: 09/16/2016  Ost 1st Mrg to 1st Mrg lesion, 50 %stenosed.  Prox LAD to Mid LAD lesion, 95 %stenosed.  Mid LAD lesion, 40 %stenosed.  Dist LAD lesion, 10 %stenosed.  Ost 2nd Mrg to 2nd Mrg lesion, 50 %stenosed.  Mid Cx lesion, 20 %stenosed.   1. Severe stenosis mid LAD 2. Moderate stenosis ostium of both OM1 and OM2.  3. Elevated filling pressures.   Recommendations: She will need staged PCI of the severe stenosis in the mid LAD given her renal insufficiency. Will hydrate gently next 24 hours. Will given one time dose of IV Lasix tonight with elevated filling pressures. Will load with Brilinta 180 mg po x 1 tonight then start Brilinta 90 mg po BID in the am. Continue ASA, statin and beta blocker. Resume IV heparin 6 hours post sheath pull. I will place her on the cath schedule for Friday 09/18/16.   Coronary Stent Intervention: 09/18/2016  Mid LAD lesion, 40 %stenosed.  Dist LAD lesion, 50 %stenosed.  A STENT SYNERGY DES 3X32 drug eluting stent was successfully placed.  Prox LAD lesion, 95 %stenosed.  Post intervention, there is a 0% residual stenosis.   Successful PCI of an eccentric 95%, and tandem 40% and 50-60%stenosis with Angiosculpt scoring balloon, and ultimate DES stenting with a 3.032 mm Synergy DES stent postdilated to 3.26 mm with the entire region of stenosis being reduced to 0%.  LV pressure recording: 120/24  RECOMMENDATION: Ms Bonifield will be gently hydrated post procedure with her baseline renal insufficiency.  LVEDP today was improved but elevated at 24, compared to 35 mmHg at diagnostic catheterization. DAPT for a minimum of 1 year  and  medical therapy for  concomitant CAD.   Patient Profile     61 y.o. female w/ PMH of chronic diastolic CHF, HTN, Type 2 DM, and prior cocaine use (quit in 1997) who presented to [facility] ED on 09/13/2016 for evaluation of worsening chest discomfort. Troponin values peaked at 0.88. Cath showed 95% m3/9.id-LAD stenosis with moderate disease along OM1 and OM2. Staged PCI of LAD was planned for 09/18/2016 due to Stage 3 CKD.    Assessment & Plan    1. NSTEMI - admitted with chest discomfort and found to have diffuse TWI on her EKG along with a peak troponin of 0.88. - echo shows an EF of 45-50% with trivial AI.  - cath performed on 3/7 showed 50% Ost 1st Mrg stenosis, 50% 2nd Mrg stenosis, 20% Mid-Cx, 95% Prox LAD, 40% mid-LAD, and 10% dist-LAD stenosis. Underwent staged PCI of Prox LAD lesion with placement of a 3x60mm Synergy DES. Will be on DAPT for at least 12 months. Has been started on ASA and Brilinta along with a BB and statin therapy.     2. HTN - BP variable at 120/57 - 171/86 in the past 24 hours. - continue Amlodipine 5mg  daily, Coreg 25mg  BID, and Hydralazine 50mg  TID. No ACE-I/ARB due to CKD.   3. HLD - Lipid Panel shows total cholesterol 122, HDL 36, and LDL 52.  - continue Atorvastatin 80mg  daily.   4. Type 2 DM - Hgb A1c at 8.0 this admission.  - continue Lantus along with SSI. Close follow-up with PCP as an outpatient.   5. Acute on Chronic Stage 3 CKD - baseline creatinine 1.7 - 1.8. - peaked at 2.37 on 09/14/2016, stable at 1.87 this AM.   6. Chronic Combined Systolic and Diastolic CHF - echo this admission shows an EF of 45-50%.  - reports being on PO Lasix 40mg  BID as an outpatient. Received IV Lasix on admission but this was held due to AKI and plans for cath. Can likely resume PO Lasix tomorrow with repeat BMET in 1 week.  - continue BB. No ACE-I/ARB with AKI.   7. Prolonged QTc - QTc at 570 following cath, slightly improved to 567 this AM.   Signed, Erma Heritage , PA-C 7:11  AM 09/19/2016 Pager: [phone]  The patient was seen and examined, and I agree with the history, physical exam, assessment and plan as documented above, with modifications as noted below. Pt doing well this morning. Denies chest pain and shortness of breath. Remains hypertensive but just received additional meds. Emphasized importance of dual antiplatelet therapy x 1 year. Will plan for discharge today.  Kate Sable, MD, [facility]  09/19/2016 8:48 AM

## 2016-09-19 NOTE — Progress Notes (Signed)
Dr Elson Areas notified of QTC 567 down from 570 on 3/9

## 2016-09-19 NOTE — Discharge Summary (Addendum)
Discharge Summary    Patient ID: [name],  MRN: [id number], DOB/AGE: Dec 10, 1955 61 y.o.  Admit date: 09/13/2016 Discharge date: 09/19/2016  Primary Care Provider: Minerva Ends Primary Cardiologist: Dr. Burt Knack  Discharge Diagnoses    Principal Problem:   Acute on chronic diastolic congestive heart failure, NYHA class 4 (HCC) Active Problems:   Diabetes mellitus type 2, uncontrolled, with complications (HCC)   Anemia in stage 3 chronic kidney disease   Essential hypertension   CKD (chronic kidney disease) stage 3, GFR 30-59 ml/min   OSA (obstructive sleep apnea)   Diabetic neuropathy (HCC)   NSTEMI (non-ST elevated myocardial infarction) ([place])   Hypertensive emergency   Acute combined systolic and diastolic congestive heart failure ([place])   Diabetes mellitus with complication (HCC)   Elevated troponin   Coronary artery disease involving native coronary artery of native heart with unstable angina pectoris ([place])    History of Present Illness     [name] is a 61 y.o. female with past medical history of chronic diastolic CHF, HTN, Type 2 DM, and prior cocaine use (quit in 1997) who presented to [facility] ED on 09/13/2016 for evaluation of worsening chest discomfort.   She had been admitted two weeks prior and had negative troponin x 2 w/Hb of 8.3 and was managed conservatively.   However, she developed recurrent left-sided chest pain present under the breast which was constant in nature. Her EKG showed anterior TWI. Her initial troponin was 0.65, therefore she was started on Heparin and admitted for further observation.   Hospital Course     Consultants: Internal Medicine  She reported continual chest discomfort the following morning which was worse on inspiration. Her BNP was elevated at 554 and she was placed on IV Lasix for diuresis. Due to her pleuritic chest pain and questionable PNA on CXR, Internal Medicine was consulted and she was placed on empiric  antibiotic therapy. Procalcitonin was normal and antibiotics were discontinued as it was thought her symptoms were secondary to acute CHF.   An echocardiogram was obtained on 3/5 and showed an EF of 45-50% with septal and apical HK. With her abnormal echo, elevated enzymes, and continued chest discomfort a cardiac catheterization was recommended. The risk of contrast induced nephropathy was reviewed with the patient in the setting of her Stage 3 CKD.   She underwent catheterization on 09/16/2016 which showed 50% Ost 1st Mrg stenosis, 50% 2nd Mrg stenosis, 20% Mid-Cx, 95% Prox LAD, 40% mid-LAD, and 10% dist-LAD stenosis. Staged PCI was recommended due to her CKD.   She went back to the cath lab on 09/18/2016 for PCI of her Prox LAD lesion with placement of a 3x53mm Synergy DES. She will be on DAPT for at least 12 months. Has been started on ASA and Brilinta along with a BB and statin therapy.     On 09/19/2016, she denied any recurrent chest pain. Ambulated over 350 ft with cardiac rehab without recurrent symptoms. Creatinine was stable at 1.87 (had peaked at 2.37 this admission with baseline of 1.7 - 1.8).   She was last examined by Dr. Bronson Ing and deemed stable for discharge. A staff message has been sent to the office to arrange for close Cardiology follow-up in 1 week. She will need a repeat BMET at that time.  _____________  Discharge Vitals Blood pressure (!) 181/66, pulse 77, temperature 98.1 F (36.7 C), temperature source Oral, resp. rate 18, height 5\' 8"  (1.727 m), weight 297  lb 13.5 oz (135.1 kg), SpO2 96 %.  Filed Weights   09/17/16 0334 09/18/16 0300 09/19/16 0402  Weight: 296 lb 9.6 oz (134.5 kg) 292 lb 12.8 oz (132.8 kg) 297 lb 13.5 oz (135.1 kg)    Labs & Radiologic Studies     CBC  Recent Labs  09/18/16 0436 09/19/16 0415  WBC 6.3 5.7  HGB 9.5* 8.9*  HCT 30.3* 28.8*  MCV 87.3 88.3  PLT 239 235   Basic Metabolic Panel  Recent Labs  09/18/16 0436 09/19/16 0415    NA 139 137  K 3.3* 4.0  CL 108 108  CO2 21* 17*  GLUCOSE 171* 217*  BUN 30* 31*  CREATININE 1.54* 1.87*  CALCIUM 8.3* 7.7*   Liver Function Tests No results for input(s): AST, ALT, ALKPHOS, BILITOT, PROT, ALBUMIN in the last 72 hours. No results for input(s): LIPASE, AMYLASE in the last 72 hours. Cardiac Enzymes No results for input(s): CKTOTAL, CKMB, CKMBINDEX, TROPONINI in the last 72 hours. BNP Invalid input(s): POCBNP D-Dimer No results for input(s): DDIMER in the last 72 hours. Hemoglobin A1C No results for input(s): HGBA1C in the last 72 hours. Fasting Lipid Panel No results for input(s): CHOL, HDL, LDLCALC, TRIG, CHOLHDL, LDLDIRECT in the last 72 hours. Thyroid Function Tests No results for input(s): TSH, T4TOTAL, T3FREE, THYROIDAB in the last 72 hours.  Invalid input(s): FREET3  Dg Chest 2 View  Result Date: 09/03/2016 CLINICAL DATA:  Pt reports sharp chest pain and SOB that started yesterday but worsened today. Denies n/v. She states "an achy" pain radiates down her left arm EXAM: CHEST - 2 VIEW COMPARISON:  03/19/2015 FINDINGS: Lungs are clear. Heart size and mediastinal contours are within normal limits. No effusion. Visualized bones unremarkable. IMPRESSION: No acute cardiopulmonary disease. Electronically Signed   By: Lucrezia Europe M.D.   On: 09/03/2016 19:37   Dg Chest Port 1 View  Result Date: 09/13/2016 CLINICAL DATA:  Acute onset of shortness of breath and generalized chest pain. Initial encounter. EXAM: PORTABLE CHEST 1 VIEW COMPARISON:  Chest radiograph performed 09/03/2016 FINDINGS: The lungs are well-aerated. Mild bibasilar opacities may reflect mild pneumonia. There is no evidence of pleural effusion or pneumothorax. The cardiomediastinal silhouette is borderline normal in size. No acute osseous abnormalities are seen. IMPRESSION: Mild bibasilar airspace opacities raise concern for mild pneumonia. Electronically Signed   By: Garald Balding M.D.   On: 09/13/2016  04:42     Diagnostic Studies/Procedures    Cardiac Catheterization: 09/16/2016  Ost 1st Mrg to 1st Mrg lesion, 50 %stenosed.  Prox LAD to Mid LAD lesion, 95 %stenosed.  Mid LAD lesion, 40 %stenosed.  Dist LAD lesion, 10 %stenosed.  Ost 2nd Mrg to 2nd Mrg lesion, 50 %stenosed.  Mid Cx lesion, 20 %stenosed.  1. Severe stenosis mid LAD 2. Moderate stenosis ostium of both OM1 and OM2.  3. Elevated filling pressures.   Recommendations: She will need staged PCI of the severe stenosis in the mid LAD given her renal insufficiency. Will hydrate gently next 24 hours. Will given one time dose of IV Lasix tonight with elevated filling pressures. Will load with Brilinta 180 mg po x 1 tonight then start Brilinta 90 mg po BID in the am. Continue ASA, statin and beta blocker. Resume IV heparin 6 hours post sheath pull. I will place her on the cath schedule for Friday 09/18/16.   Coronary Stent Intervention: 09/18/2016  Mid LAD lesion, 40 %stenosed.  Dist LAD lesion, 50 %stenosed.  A STENT  SYNERGY DES 3X32 drug eluting stent was successfully placed.  Prox LAD lesion, 95 %stenosed.  Post intervention, there is a 0% residual stenosis.  Successful PCI of an eccentric 95%, and tandem 40% and 50-60%stenosis with Angiosculpt scoring balloon, and ultimate DES stenting with a 3.032 mm Synergy DES stent postdilated to 3.26 mm with the entire region of stenosis being reduced to 0%.  LV pressure recording: 120/24  RECOMMENDATION: Ms Goh will be gently hydrated post procedure with her baseline renal insufficiency. LVEDP today was improved but elevated at 24, compared to 35 mmHg at diagnostic catheterization. DAPT for a minimum of 1 year and medical therapy for concomitant CAD.   Echocardiogram: 09/14/2016 Study Conclusions  - Left ventricle: Septal and apical hypokinesis The cavity size was   normal. Systolic function was mildly reduced. The estimated   ejection fraction was in the range of  45% to 50%. Wall motion was   normal; there were no regional wall motion abnormalities. Doppler   parameters are consistent with both elevated ventricular   end-diastolic filling pressure and elevated left atrial filling   pressure. - Aortic valve: There was trivial regurgitation. Valve area (VTI):   1.38 cm^2. Valve area (Vmax): 1.24 cm^2. Valve area (Vmean): 1.27   cm^2. - Left atrium: The atrium was mildly dilated. - Atrial septum: No defect or patent foramen ovale was identified. - Pulmonary arteries: PA peak pressure: 38 mm Hg (S).    Disposition   Pt is being discharged home today in good condition.  Follow-up Plans & Appointments    Follow-up Information    [facility]. Go on 09/21/2016.   Why:  at 2:00pm for an appointment with Dr Jarold Song in the [facility].  Please bring all of your medications with you to the appointment. Ask for an appt with insurance navigator. Contact information: [place] [address] [phone]       Sherren Mocha, MD Follow up.   Specialty:  Cardiology Why:  The office will contact you within 3 business days to arrange follow-up. If you do not hear from them, please call the number provided.  Contact information: [address] [place] [address] [phone]          Discharge Instructions    Amb Referral to Cardiac Rehabilitation    Complete by:  As directed    Diagnosis:   NSTEMI Coronary Stents     Diet - low sodium heart healthy    Complete by:  As directed    Discharge instructions    Complete by:  As directed    PLEASE REMEMBER TO BRING ALL OF YOUR MEDICATIONS TO EACH OF YOUR FOLLOW-UP OFFICE VISITS.  PLEASE ATTEND ALL SCHEDULED FOLLOW-UP APPOINTMENTS.   Activity: Increase activity slowly as tolerated. You may shower, but no soaking baths (or swimming) for 1 week. No driving for 1 week. No lifting over 10 lbs for 2 weeks. No  sexual activity for 2 weeks.   Wound Care: You may wash cath site gently with soap and water. Keep cath site clean and dry. If you notice pain, swelling, bleeding or pus at your cath site, please call [phone].   Increase activity slowly    Complete by:  As directed       Discharge Medications     Medication List    STOP taking these medications   aspirin 325 MG tablet Replaced by:  aspirin 81 MG EC tablet  isosorbide mononitrate 30 MG 24 hr tablet Commonly known as:  IMDUR     TAKE these medications   acetaminophen-codeine 300-30 MG tablet Commonly known as:  TYLENOL #3 Take 1 tablet by mouth every 8 (eight) hours as needed. What changed:  reasons to take this   albuterol (2.5 MG/3ML) 0.083% nebulizer solution Commonly known as:  PROVENTIL Take 3 mLs (2.5 mg total) by nebulization every 6 (six) hours as needed for wheezing or shortness of breath.   albuterol 108 (90 Base) MCG/ACT inhaler Commonly known as:  VENTOLIN HFA Inhale 2 puffs into the lungs every 4 (four) hours as needed for wheezing or shortness of breath.   amLODipine 5 MG tablet Commonly known as:  NORVASC Take 1 tablet (5 mg total) by mouth daily. Start taking on:  09/20/2016   aspirin 81 MG EC tablet Take 1 tablet (81 mg total) by mouth daily. Replaces:  aspirin 325 MG tablet   atorvastatin 80 MG tablet Commonly known as:  LIPITOR Take 1 tablet (80 mg total) by mouth daily at 6 PM. What changed:  See the new instructions.   carvedilol 12.5 MG tablet Commonly known as:  COREG Take 2 tablets (25 mg total) by mouth 2 (two) times daily with a meal.   diclofenac sodium 1 % Gel Commonly known as:  VOLTAREN Apply 4 g topically daily as needed (for pain).   exenatide 5 MCG/0.02ML Sopn injection Commonly known as:  BYETTA 5 MCG PEN INJECT 5 MCG INTO THE SKIN 2 TIMES DAILY WITH A MEAL.   ferrous sulfate 325 (65 FE) MG tablet Take 1 tablet (325 mg total) by mouth 2 (two) times daily with a meal.    fluticasone 50 MCG/ACT nasal spray Commonly known as:  FLONASE Place 2 sprays into both nostrils daily. What changed:  when to take this  reasons to take this   Fluticasone-Salmeterol 250-50 MCG/DOSE Aepb Commonly known as:  ADVAIR DISKUS Inhale 1 puff into the lungs 2 (two) times daily.   gabapentin 100 MG capsule Commonly known as:  NEURONTIN Take 2 capsules (200 mg total) by mouth 3 (three) times daily.   hydrALAZINE 50 MG tablet Commonly known as:  APRESOLINE Take 1 tablet (50 mg total) by mouth 3 (three) times daily.   hydrOXYzine 50 MG tablet Commonly known as:  ATARAX/VISTARIL Take 1 tablet (50 mg total) by mouth 3 (three) times daily as needed. What changed:  reasons to take this   insulin aspart 100 UNIT/ML FlexPen Commonly known as:  NOVOLOG Inject 10 Units into the skin 3 (three) times daily with meals.   Insulin Glargine 100 UNIT/ML Solostar Pen Commonly known as:  LANTUS SOLOSTAR Inject 50 Units into the skin daily at 10 pm. What changed:  when to take this   ketotifen 0.025 % ophthalmic solution Commonly known as:  ZADITOR Place 1 drop into both eyes daily as needed (for dry eyes).   ranitidine 150 MG tablet Commonly known as:  ZANTAC Take 1 tablet (150 mg total) by mouth 2 (two) times daily.   sitaGLIPtin 50 MG tablet Commonly known as:  JANUVIA Take 1 tablet (50 mg total) by mouth daily.   ticagrelor 90 MG Tabs tablet Commonly known as:  BRILINTA Take 1 tablet (90 mg total) by mouth 2 (two) times daily.   traMADol 50 MG tablet Commonly known as:  ULTRAM Take 1 tablet (50 mg total) by mouth every 6 (six) hours as needed for moderate pain.   valACYclovir 500 MG  tablet Commonly known as:  VALTREX TAKE 1 TABLET BY MOUTH DAILY.       Aspirin prescribed at discharge?  Yes High Intensity Statin Prescribed? (Lipitor 40-80mg  or Crestor 20-40mg ): Yes Beta Blocker Prescribed? Yes For EF 40% or less, Was ACEI/ARB Prescribed? No: EF > 40%,  CKD ADP Receptor Inhibitor Prescribed? (i.e. Plavix etc.-Includes Medically Managed Patients): Yes For EF <40%, Aldosterone Inhibitor Prescribed? No: EF > 40% Was EF assessed during THIS hospitalization? Yes Was Cardiac Rehab II ordered? (Included Medically managed Patients): Yes   Allergies Allergies  Allergen Reactions  . Shellfish Allergy Anaphylaxis and Swelling  . Diazepam Other (See Comments)    "felt like out of body experience"  . Morphine And Related Itching     Outstanding Labs/Studies   BMET at follow-up.   Duration of Discharge Encounter   Greater than 30 minutes including physician time.  Signed, Erma Heritage, PA-C 09/19/2016, 12:20 PM

## 2016-09-19 NOTE — Care Management Note (Signed)
Case Management Note  Patient Details  Name: [name] MRN: [id number] Date of Birth: 07/15/1955  Subjective/Objective:                  cad Action/Plan: Discharge planning Expected Discharge Date:                  Expected Discharge Plan:  Home/Self Care  In-House Referral:  NA  Discharge planning Services  CM Consult, Follow-up appt scheduled, Indigent Health Clinic, Medication Assistance  Post Acute Care Choice:  NA Choice offered to:  NA  DME Arranged:  N/A DME Agency:  NA  HH Arranged:  NA HH Agency:  NA  Status of Service:  Completed, signed off  If discussed at Long Length of Stay Meetings, dates discussed:    Additional Comments: CM met with pt and confirmed she is followed by the clinic.  As it is the weekend and [facility] is closed, MATCH letter with list of participating pharmacies given to pt.  Pt verbalized understanding of all parameters of MATCH.  Pt was also given Brilinta free trial card for today's prescription.  Pt states she will follow up with Navigator for insurance.  No other CM needs were communicated.   ,  Christine, RN 09/19/2016, 10:33 AM  

## 2016-09-19 NOTE — Progress Notes (Signed)
CARDIAC REHAB PHASE I   PRE:  Rate/Rhythm: 74 SR  BP:  Supine:   Sitting: 181/66  Standing:    SaO2: 99%RA  MODE:  Ambulation: 350 ft   POST:  Rate/Rhythm: 91 SR  BP:  Supine:   Sitting: 142/76,.701/10 after rest  Standing:    SaO2: 98%RA 0830-0934 Pt walked to bathroom and then 350 ft on RA with steady gait. No c/o CP. MI education completed with pt who voiced understanding. Became sleepy toward end of ed so encouraged her to read materials. Gave CHF booklet as she stated she had not had one before. Encouraged daily weights and 2000 mg sodium restriction. Discussed zones of when to call MD. Reviewed NTG use, MI restrictions, ex ed and CRP 2. Will refer to [facility]. Pt awaiting to see if she qualifies for Medicaid. Discussed importance of brilinta with stent. Pt needs to see case manager. BP elevated as documented. MD aware.   Graylon Good, RN BSN  09/19/2016 9:26 AM

## 2016-09-21 ENCOUNTER — Telehealth: Payer: Self-pay

## 2016-09-21 ENCOUNTER — Encounter (HOSPITAL_COMMUNITY): Payer: Self-pay | Admitting: Cardiovascular Disease

## 2016-09-21 ENCOUNTER — Inpatient Hospital Stay: Payer: Medicaid Other | Admitting: Family Medicine

## 2016-09-21 MED FILL — ATORVASTATIN 80 MG TABLET: 80 | 30 days supply | Qty: 30 | Fill #0

## 2016-09-21 MED FILL — CARVEDILOL 12.5 MG TABLET: 12.5 | 15 days supply | Qty: 60 | Fill #0

## 2016-09-21 MED FILL — raNITIdine HCL 150 MG TABS: 150 | 30 days supply | Qty: 60 | Fill #2

## 2016-09-21 MED FILL — hydrOXYzine HCL 50 MG TABS: 50 | 30 days supply | Qty: 90 | Fill #1

## 2016-09-21 MED FILL — ?AMLODIPINE BESYLATE 5 MG T: 5 | 30 days supply | Qty: 30 | Fill #0

## 2016-09-21 MED FILL — FERROUS SULFATE 325 MG TAB: 325 (65 FE) | 30 days supply | Qty: 60 | Fill #2

## 2016-09-21 NOTE — Telephone Encounter (Signed)
[facility] Post-discharge Follow-Up Phone Call:  Date of Discharge: 09/19/2016 Principal Discharge Diagnosis(es):acute on chronic diastolic CHF, DM, anemia in state 3 CKD Post-discharge Communication: (Clearly document all attempts clearly and date contact made) call placed to the patient Call Completed: Yes                    With Whom: Patient Interpreter Needed: No     Please check all that apply:  X  Patient is knowledgeable of his/her condition(s) and/or treatment. X  Patient is caring for self at home.  - She said that she has a " staff person" who lives with her but her family is able to help with shopping/ meal preparation. She noted that her niece is coming to see her today she but she also has a sister and daughter who are able to come to help her. She noted that she has people bringing her food so there is no problem with access to meals.  ? Patient is receiving assist at home from family and/or caregiver. Family and/or caregiver is knowledgeable of patient's condition(s) and/or treatment. ? Patient is receiving home health services. If so, name of agency.     Medication Reconciliation:  ? Medication list reviewed with patient. X  Patient obtained all discharge medications. If not, why? - She said that she did not have the medication list with her but she was still able to review her medication list from her memory  with this CM.    Regarding the new medications, she noted that she does not have the amlodipine but she does have the brilinta, tramadol and aspirin 81 mg. She also noted that she has atorvastatin but is not sure that it is 80 mg.   Instructed her to stop taking the aspirin 325 mg and the imdur as noted on the discharge instructions.She said that she has a nebulizer and glucometer and scale however,thye are still packed in boxes from her move.She moved into a new residence about 2 weeks ago.  She said that her niece will be coming to help her unpack those  items. When reviewing the list, she said that in addition to the amlodipine she needs,carvedilol, ferrous sulfate, advair, hydroxyzine, ranitidine and valtrex.  She noted that she received her lyrica in the mail and is taking that instead of gabapentin. Also in the mail, she receives her [place]. She also stated that she takes her lantus in the morning instead of at night.    Activities of Daily Living:  X  Independent - She denied the need for any DME.  ? Needs assist (describe; ? home DME used) ? Total [facility] (describe, ? home DME used)   Community resources in place for patient:  X  None  ? Home Health/Home DME ? Assisted Living ? Support Group          Patient Education: She currently has family planning medicaid and said she has been denied regular medicaid in the past. She noted that she needs to apply again and also noted that she has already applied for medicaid.  She said that she has been experiencing occasional episodes of her " heart fluttering."  She explained that the feeling " comes and goes."  She said that it is not chest pain and there is no radiation of the feeling, She denied and shortness of breath, nausea/vomiting. She said that she is walking around the house " fine."  She said that she was going to call  the cardiologist to discuss the " fluttering" but he told her in the hospital that it is ' okay" to have that feeling.  This CM encouraged her to call and discuss with the cardiologist and also reviewed with her when to return to ED.   She reported that her cath site is without signs of infection/ bleeding.         Questions/Concerns discussed: She confirmed her appointment with the [facility] on 09/23/16 @ 1400 and said that her sister would be able to transport her to the clinic.    This CM spoke to [employer], [facility] and placed ordered for refills for the carvedilol, ferrous sulfate, hydroxyzine, ranitidine. He stated that they have prescriptions for amlodipine and  atorvastatin. He stated that they do not have refills for the advair or valtrex.   Message sent to Dr Jarold Song with an update on the patient's status and need for prescriptions for valtrex and advair.

## 2016-09-22 ENCOUNTER — Telehealth: Payer: Self-pay

## 2016-09-22 NOTE — Telephone Encounter (Signed)
Attempted to contact the patient to check on her status and to remind her to pick up her medications at the pharmacy and also remind her of her appointment with the [facility] tomorrow  - 09/23/16 @ 1400.  Call placed to # [phone] (H) and a HIPAA compliant voicemail message was left requesting a call back to # [phone] or [phone].

## 2016-09-23 ENCOUNTER — Ambulatory Visit: Payer: Medicaid Other | Attending: Family Medicine | Admitting: Family Medicine

## 2016-09-23 ENCOUNTER — Encounter: Payer: Self-pay | Admitting: Family Medicine

## 2016-09-23 VITALS — BP 160/70 | HR 86 | Temp 97.7°F | Ht 68.0 in | Wt 294.2 lb

## 2016-09-23 DIAGNOSIS — Z79899 Other long term (current) drug therapy: Secondary | ICD-10-CM | POA: Diagnosis not present

## 2016-09-23 DIAGNOSIS — E669 Obesity, unspecified: Secondary | ICD-10-CM | POA: Insufficient documentation

## 2016-09-23 DIAGNOSIS — Z6841 Body Mass Index (BMI) 40.0 and over, adult: Secondary | ICD-10-CM | POA: Diagnosis not present

## 2016-09-23 DIAGNOSIS — I10 Essential (primary) hypertension: Secondary | ICD-10-CM

## 2016-09-23 DIAGNOSIS — Z794 Long term (current) use of insulin: Secondary | ICD-10-CM | POA: Diagnosis not present

## 2016-09-23 DIAGNOSIS — E1122 Type 2 diabetes mellitus with diabetic chronic kidney disease: Secondary | ICD-10-CM | POA: Insufficient documentation

## 2016-09-23 DIAGNOSIS — E118 Type 2 diabetes mellitus with unspecified complications: Secondary | ICD-10-CM

## 2016-09-23 DIAGNOSIS — D631 Anemia in chronic kidney disease: Secondary | ICD-10-CM

## 2016-09-23 DIAGNOSIS — Z8619 Personal history of other infectious and parasitic diseases: Secondary | ICD-10-CM | POA: Insufficient documentation

## 2016-09-23 DIAGNOSIS — I130 Hypertensive heart and chronic kidney disease with heart failure and stage 1 through stage 4 chronic kidney disease, or unspecified chronic kidney disease: Secondary | ICD-10-CM | POA: Insufficient documentation

## 2016-09-23 DIAGNOSIS — Z7982 Long term (current) use of aspirin: Secondary | ICD-10-CM | POA: Diagnosis not present

## 2016-09-23 DIAGNOSIS — IMO0002 Reserved for concepts with insufficient information to code with codable children: Secondary | ICD-10-CM

## 2016-09-23 DIAGNOSIS — I25110 Atherosclerotic heart disease of native coronary artery with unstable angina pectoris: Secondary | ICD-10-CM

## 2016-09-23 DIAGNOSIS — Z7902 Long term (current) use of antithrombotics/antiplatelets: Secondary | ICD-10-CM | POA: Insufficient documentation

## 2016-09-23 DIAGNOSIS — Z9109 Other allergy status, other than to drugs and biological substances: Secondary | ICD-10-CM

## 2016-09-23 DIAGNOSIS — A6000 Herpesviral infection of urogenital system, unspecified: Secondary | ICD-10-CM

## 2016-09-23 DIAGNOSIS — N183 Chronic kidney disease, stage 3 unspecified: Secondary | ICD-10-CM

## 2016-09-23 DIAGNOSIS — J45909 Unspecified asthma, uncomplicated: Secondary | ICD-10-CM | POA: Diagnosis not present

## 2016-09-23 DIAGNOSIS — Z955 Presence of coronary angioplasty implant and graft: Secondary | ICD-10-CM | POA: Diagnosis not present

## 2016-09-23 DIAGNOSIS — E1165 Type 2 diabetes mellitus with hyperglycemia: Secondary | ICD-10-CM

## 2016-09-23 DIAGNOSIS — I5033 Acute on chronic diastolic (congestive) heart failure: Secondary | ICD-10-CM

## 2016-09-23 LAB — COMPLETE METABOLIC PANEL WITH GFR
ALT: 36 U/L — ABNORMAL HIGH (ref 6–29)
AST: 25 U/L (ref 10–35)
Albumin: 3.5 g/dL — ABNORMAL LOW (ref 3.6–5.1)
Alkaline Phosphatase: 212 U/L — ABNORMAL HIGH (ref 33–130)
BUN: 24 mg/dL (ref 7–25)
CO2: 23 mmol/L (ref 20–31)
Calcium: 9 mg/dL (ref 8.6–10.4)
Chloride: 112 mmol/L — ABNORMAL HIGH (ref 98–110)
Creat: 1.36 mg/dL — ABNORMAL HIGH (ref 0.50–1.05)
GFR, Est African American: 49 mL/min — ABNORMAL LOW (ref >=60)
GFR, Est Non African American: 43 mL/min — ABNORMAL LOW (ref >=60)
Glucose, Bld: 116 mg/dL — ABNORMAL HIGH (ref 65–99)
Potassium: 4.9 mmol/L (ref 3.5–5.3)
Sodium: 143 mmol/L (ref 135–146)
Total Bilirubin: 0.5 mg/dL (ref 0.2–1.2)
Total Protein: 6.9 g/dL (ref 6.1–8.1)

## 2016-09-23 LAB — GLUCOSE, POCT (MANUAL RESULT ENTRY): POC Glucose: 130 mg/dl — AB (ref 70–99)

## 2016-09-23 MED ORDER — VALACYCLOVIR HCL 500 MG PO TABS: 500.0000 mg | ORAL_TABLET | Freq: Every day | ORAL | 0 refills | Status: DC

## 2016-09-23 MED ORDER — FLUTICASONE-SALMETEROL 250-50 MCG/DOSE IN AEPB: 1.0000 | INHALATION_SPRAY | Freq: Two times a day (BID) | RESPIRATORY_TRACT | 3 refills | Status: DC

## 2016-09-23 MED ORDER — FUROSEMIDE 10 MG/ML IJ SOLN
40.0000 mg | Freq: Once | INTRAMUSCULAR | Status: AC
  Administered 2016-09-23: 40 mg via INTRAMUSCULAR

## 2016-09-23 MED ORDER — HYDROXYZINE HCL 50 MG PO TABS
50.0000 mg | ORAL_TABLET | Freq: Three times a day (TID) | ORAL | 1 refills | Status: DC | PRN
Start: 2016-09-23 — End: 2016-12-17

## 2016-09-23 MED ORDER — FUROSEMIDE 40 MG PO TABS: 40.0000 mg | ORAL_TABLET | Freq: Every day | ORAL | 3 refills | Status: DC

## 2016-09-23 MED ORDER — IPRATROPIUM-ALBUTEROL 0.5-2.5 (3) MG/3ML IN SOLN
3.0000 mL | Freq: Once | RESPIRATORY_TRACT | Status: AC
  Administered 2016-09-23: 3 mL via RESPIRATORY_TRACT

## 2016-09-23 MED ORDER — FUROSEMIDE 40 MG PO TABS
40.0000 mg | ORAL_TABLET | Freq: Once | ORAL | Status: AC
Start: 2016-09-23 — End: 2016-09-23
  Administered 2016-09-23: 40 mg via ORAL

## 2016-09-23 MED FILL — **ADVAIR 250/50 DISKUS: 250-50 MCG | 14 days supply | Qty: 28 | Fill #0

## 2016-09-23 MED FILL — ?FUROSEMIDE 40 MG TABLET: 40 | 30 days supply | Qty: 30 | Fill #0

## 2016-09-23 MED FILL — ?VALACYCLOVIR HCL 500MG TAB: 500 | 30 days supply | Qty: 30 | Fill #0

## 2016-09-23 NOTE — Progress Notes (Signed)
Needs refills

## 2016-09-24 ENCOUNTER — Telehealth: Payer: Self-pay | Admitting: Physician Assistant

## 2016-09-24 LAB — BRAIN NATRIURETIC PEPTIDE: Brain Natriuretic Peptide: 137.2 pg/mL — ABNORMAL HIGH (ref <100)

## 2016-09-24 NOTE — Telephone Encounter (Signed)
New Message   pt verbalized that she is returning call for rn   Her PCP put her back on lasix 09-23-16 and she feels that fluid is around her heart and lungs    Pt c/o of Chest Pain: STAT if CP now or developed within 24 hours  1. Are you having CP right now? no 2. Are you experiencing any other symptoms (ex. SOB, nausea, vomiting, sweating)? sob 3. How long have you been experiencing CP? 09-22-16 4. Is your CP continuous or coming and going?  coming and going  5. Have you taken Nitroglycerin? no?

## 2016-09-24 NOTE — Progress Notes (Addendum)
Transitional care clinic  Date of telephone encounter: 09/24/16  Hospitalization dates: 3//18-3/10/18  PCP: Dr Adrian Blackwater  Cardiologist: Dr Burt Knack Subjective:  Patient ID: [name], female    DOB: June 10, 1956  Age: 61 y.o. MRN: [id number]  CC: Diabetes; Shortness of Breath; Post-op Problem (chest pain when moving); Hypertension; Edema (right cheek and both feet); and Weight Gain   HPI [name] is a 61 year old female with a history of uncontrolled type 2 diabetes mellitus (A1c 8.0) hypertension, CHF (NYHA class IV, EF 45-50% from 09/2016), asthma, CK D stage III, anemia, GERD who presents to the transitional care clinic for follow-up from hospitalization which was managed for acute on chronic congestive heart failure.  She had presented to the ED with chest discomfort troponins were elevated at 0.82, 0.86, 0.88, BNP was 554 chest x-Mood revealed mild bibasal airspace opacities which raise a concern for mild pneumonia. An echocardiogram was obtained on 3/5 and showed an EF of 45-50% with septal and apical hypokinesis.    She underwent catheterization on 09/16/2016 which showed 50% Ost 1st Mrg stenosis, 50% 2nd Mrg stenosis, 20% Mid-Cx, 95% Prox LAD, 40% mid-LAD, and 10% dist-LAD stenosis. Staged PCI was recommended due to her CKD.   She was taken back to the cath lab on 09/18/2016 for PCI of her Prox LAD lesion with placement of a 3x65mm Synergy DES. She will be on DAPT for at least 12 months.She was been started on ASA and Brilinta along with a beta blocker and statin therapy.   Today she complains of shortness of breath on mild exertion and even while speaking but none at rest; review of her chart indicates she was not discharged on Lasix. She has been wheezing and has noticed some pedal edema and occasionally feels her heart fluttering, she has chest pains on exertion and complains of some slight pain and swelling at the site of the cardiac cath in her right forearm. She does not have  an upcoming appointment with cardiology.  Past Medical History:  Diagnosis Date  . Asthma    As a child   . Bronchitis   . CAD (coronary artery disease)    a. 09/2016: 50% Ost 1st Mrg stenosis, 50% 2nd Mrg stenosis, 20% Mid-Cx, 95% Prox LAD, 40% mid-LAD, and 10% dist-LAD stenosis. Staged PCI with DES to Prox-LAD.   [name] CHF (congestive heart failure) ([place]) 2011  . Complication of anesthesia   . Diabetes mellitus Dx 1989  . Hepatitis C Dx 2013  . Hypertension Dx 1989  . Obesity   . Pancreatitis 2013  . Refusal of blood transfusions as patient is Jehovah's Witness   . Ulcer ([place]) 2010    Past Surgical History:  Procedure Laterality Date  . CHOLECYSTECTOMY    . CORONARY STENT INTERVENTION N/A 09/18/2016   Procedure: Coronary Stent Intervention;  Surgeon: Troy Sine, MD;  Location: [facility];  Service: Cardiovascular;  Laterality: N/A;  . EYE SURGERY    . KNEE ARTHROSCOPY    . LEFT HEART CATH N/A 09/18/2016   Procedure: Left Heart Cath;  Surgeon: Troy Sine, MD;  Location: [facility];  Service: Cardiovascular;  Laterality: N/A;  . LEFT HEART CATH AND CORONARY ANGIOGRAPHY N/A 09/16/2016   Procedure: Left Heart Cath and Coronary Angiography;  Surgeon: Burnell Blanks, MD;  Location: [facility];  Service: Cardiovascular;  Laterality: N/A;  . TUBAL LIGATION    . TUBAL LIGATION      Outpatient  Medications Prior to Visit  Medication Sig Dispense Refill  . acetaminophen-codeine (TYLENOL #3) 300-30 MG tablet Take 1 tablet by mouth every 8 (eight) hours as needed. (Patient taking differently: Take 1 tablet by mouth every 8 (eight) hours as needed for moderate pain. ) 90 tablet 2  . albuterol (PROVENTIL) (2.5 MG/3ML) 0.083% nebulizer solution Take 3 mLs (2.5 mg total) by nebulization every 6 (six) hours as needed for wheezing or shortness of breath. 150 mL 1  . albuterol (VENTOLIN HFA) 108 (90 Base) MCG/ACT inhaler Inhale 2 puffs into the lungs every 4 (four)  hours as needed for wheezing or shortness of breath. 54 g 3  . aspirin EC 81 MG EC tablet Take 1 tablet (81 mg total) by mouth daily.    [name] atorvastatin (LIPITOR) 80 MG tablet Take 1 tablet (80 mg total) by mouth daily at 6 PM. 30 tablet 11  . carvedilol (COREG) 12.5 MG tablet Take 2 tablets (25 mg total) by mouth 2 (two) times daily with a meal. 60 tablet 0  . diclofenac sodium (VOLTAREN) 1 % GEL Apply 4 g topically daily as needed (for pain). 100 g 2  . exenatide (BYETTA 5 MCG PEN) 5 MCG/0.02ML SOPN injection INJECT 5 MCG INTO THE SKIN 2 TIMES DAILY WITH A MEAL. 3 pen 3  . ferrous sulfate 325 (65 FE) MG tablet Take 1 tablet (325 mg total) by mouth 2 (two) times daily with a meal. 60 tablet 3  . fluticasone (FLONASE) 50 MCG/ACT nasal spray Place 2 sprays into both nostrils daily. (Patient taking differently: Place 2 sprays into both nostrils daily as needed for allergies. ) 16 g 1  . gabapentin (NEURONTIN) 100 MG capsule Take 2 capsules (200 mg total) by mouth 3 (three) times daily. 180 capsule 2  . hydrALAZINE (APRESOLINE) 50 MG tablet Take 1 tablet (50 mg total) by mouth 3 (three) times daily. 90 tablet 0  . insulin aspart (NOVOLOG) 100 UNIT/ML FlexPen Inject 10 Units into the skin 3 (three) times daily with meals. 30 mL 3  . Insulin Glargine (LANTUS SOLOSTAR) 100 UNIT/ML Solostar Pen Inject 50 Units into the skin daily at 10 pm. (Patient taking differently: Inject 50 Units into the skin every morning. ) 45 mL 3  . ketotifen (ZADITOR) 0.025 % ophthalmic solution Place 1 drop into both eyes daily as needed (for dry eyes). 10 mL 2  . ranitidine (ZANTAC) 150 MG tablet Take 1 tablet (150 mg total) by mouth 2 (two) times daily. 60 tablet 3  . sitaGLIPtin (JANUVIA) 50 MG tablet Take 1 tablet (50 mg total) by mouth daily. 90 tablet 3  . ticagrelor (BRILINTA) 90 MG TABS tablet Take 1 tablet (90 mg total) by mouth 2 (two) times daily. 60 tablet 11  . traMADol (ULTRAM) 50 MG tablet Take 1 tablet (50 mg  total) by mouth every 6 (six) hours as needed for moderate pain. 10 tablet 0  . valACYclovir (VALTREX) 500 MG tablet TAKE 1 TABLET BY MOUTH DAILY. 30 tablet 0  . amLODipine (NORVASC) 5 MG tablet Take 1 tablet (5 mg total) by mouth daily. (Patient not taking: Reported on 09/23/2016) 30 tablet 11  . Fluticasone-Salmeterol (ADVAIR DISKUS) 250-50 MCG/DOSE AEPB Inhale 1 puff into the lungs 2 (two) times daily. (Patient not taking: Reported on 09/03/2016) 180 each 5  . hydrOXYzine (ATARAX/VISTARIL) 50 MG tablet Take 1 tablet (50 mg total) by mouth 3 (three) times daily as needed. (Patient not taking: Reported on 09/23/2016) 90 tablet 3  No facility-administered medications prior to visit.     ROS Review of Systems  Constitutional: Negative for activity change, appetite change and fatigue.  HENT: Negative for congestion, sinus pressure and sore throat.   Eyes: Negative for visual disturbance.  Respiratory: Positive for cough, shortness of breath and wheezing. Negative for chest tightness.   Cardiovascular: Positive for chest pain and leg swelling. Negative for palpitations.  Gastrointestinal: Negative for abdominal distention, abdominal pain and constipation.  Endocrine: Negative for polydipsia.  Genitourinary: Negative for dysuria and frequency.  Musculoskeletal: Negative for arthralgias and back pain.  Skin: Negative for rash.  Neurological: Negative for tremors, light-headedness and numbness.  Hematological: Does not bruise/bleed easily.  Psychiatric/Behavioral: Negative for agitation and behavioral problems.    Objective:  BP (!) 160/70 (BP Location: Left Arm, Patient Position: Sitting, Cuff Size: Large)   Pulse 86   Temp 97.7 F (36.5 C) (Oral)   Ht 5\' 8"  (1.727 m)   Wt 294 lb 3.2 oz (133.4 kg)   SpO2 97%   BMI 44.73 kg/m   BP/Weight 09/23/2016 09/19/2016 7/85/8850  Systolic BP 277 412 878  Diastolic BP 70 66 53  Wt. (Lbs) 294.2 297.84 296  BMI 44.73 45.29 45.01     Physical  Exam  Constitutional: She is oriented to person, place, and time. She appears well-developed.  Dyspneic while speaking  Neck: JVD present.  Cardiovascular: Normal rate, normal heart sounds and intact distal pulses.   No murmur heard. Pulmonary/Chest: She is in respiratory distress. She has wheezes. She has no rales. She exhibits no tenderness.  Abdominal: Soft. Bowel sounds are normal. She exhibits no distension and no mass. There is no tenderness.  Musculoskeletal: Normal range of motion. She exhibits edema (1+ bilateral pitting pedal edema).  Slight tenderness and edema of flexor aspect of the distal right forearm  Neurological: She is alert and oriented to person, place, and time.  Skin: Skin is warm and dry.  Psychiatric: She has a normal mood and affect.     Lab Results  Component Value Date   HGBA1C 8.0 (H) 09/13/2016     Assessment & Plan:   1. Uncontrolled type 2 diabetes mellitus with complication, without long-term current use of insulin (HCC) A1c of 8.0 Not fully optimized as goal A1c is less than 7 Continue current medications, diabetic diet, lifestyle modifications and weight loss - Glucose (CBG) - COMPLETE METABOLIC PANEL WITH GFR  2. Uncomplicated asthma, unspecified asthma severity, unspecified whether persistent Acute exacerbation Currently wheezing-will give nebulizer treatment in clinic - Fluticasone-Salmeterol (ADVAIR DISKUS) 250-50 MCG/DOSE AEPB; Inhale 1 puff into the lungs 2 (two) times daily.  Dispense: 60 each; Refill: 3 - ipratropium-albuterol (DUONEB) 0.5-2.5 (3) MG/3ML nebulizer solution 3 mL; Take 3 mLs by nebulization once.  3. Genital herpes simplex, unspecified site Prophylactic treatment - valACYclovir (VALTREX) 500 MG tablet; Take 1 tablet (500 mg total) by mouth daily.  Dispense: 30 tablet; Refill: 0  4. Acute on chronic diastolic congestive heart failure, NYHA class 4 (HCC) EF of 45-50% from 20 a: 09/2016 Currently with fluid overload due  to not being discharged on Lasix  Lasix 40 mg IM in the clinic, 40 mg by mouth in the clinic- dyspnea improved after administration of Lasix Spoke with pharmacy so patient can obtain her Lasix prior to discharge today We'll check renal function - explained to the patient risk of worsening renal function versus maintaining euvolemia  Encouraged to make an appointment with cardiology. - furosemide (LASIX) 40 MG tablet;  Take 1 tablet (40 mg total) by mouth daily.  Dispense: 30 tablet; Refill: 3 - Brain natriuretic peptide - furosemide (LASIX) tablet 40 mg; Take 1 tablet (40 mg total) by mouth once. - furosemide (LASIX) injection 40 mg; Inject 4 mLs (40 mg total) into the muscle once.  5. Anemia in stage 3 chronic kidney disease May need referral to nephrology at her next office visit  6. Essential hypertension Uncontrolled-yet to pick up medications Spoke with the pharmacy and they should be ready today Low-sodium, DASH diet, lifestyle modification  7. Coronary artery disease involving native coronary artery of native heart with unstable angina pectoris (HCC) s/p DES to prox LAD Continue dual antiplatelet therapy for at least 12 months  8. Environmental allergies - hydrOXYzine (ATARAX/VISTARIL) 50 MG tablet; Take 1 tablet (50 mg total) by mouth 3 (three) times daily as needed.  Dispense: 90 tablet; Refill: 1   Meds ordered this encounter  Medications  . Fluticasone-Salmeterol (ADVAIR DISKUS) 250-50 MCG/DOSE AEPB    Sig: Inhale 1 puff into the lungs 2 (two) times daily.    Dispense:  60 each    Refill:  3  . valACYclovir (VALTREX) 500 MG tablet    Sig: Take 1 tablet (500 mg total) by mouth daily.    Dispense:  30 tablet    Refill:  0  . furosemide (LASIX) 40 MG tablet    Sig: Take 1 tablet (40 mg total) by mouth daily.    Dispense:  30 tablet    Refill:  3  . hydrOXYzine (ATARAX/VISTARIL) 50 MG tablet    Sig: Take 1 tablet (50 mg total) by mouth 3 (three) times daily as needed.      Dispense:  90 tablet    Refill:  1  . furosemide (LASIX) tablet 40 mg  . furosemide (LASIX) injection 40 mg  . ipratropium-albuterol (DUONEB) 0.5-2.5 (3) MG/3ML nebulizer solution 3 mL    Follow-up: Return in about 2 weeks (around 10/07/2016) for TCC: Follow-up on CHF.   Arnoldo Morale MD

## 2016-09-24 NOTE — Telephone Encounter (Signed)
Patient is calling and states that she had PCI with DES to LAD on 09/18/16. The patient states that she has been experiencing chest tightness since this yesterday. She states that it is intermittent and happens at rest. She states that she was SOB a few days ago and was seen by her PCP and they started lasix 40 mg daily. The patient states that she has also been hypertensive with her last BP 180/90. She states that she is taking all of her BP meds as directed and is taking her brilinta and ASA. The patient has a TCM appointment with Evette Doffing, PA on 10/01/16. Discussed with Dr. Angelena Form DOD and he suggests that the patient be added to Dr. Antionette Char schedule tomorrow since he saw her in the hospital. Patient scheduled with Dr. Burt Knack at 2:15 PM 09/25/16. Patient verbalized understanding.

## 2016-09-25 ENCOUNTER — Ambulatory Visit (INDEPENDENT_AMBULATORY_CARE_PROVIDER_SITE_OTHER): Payer: Self-pay | Admitting: Cardiovascular Disease

## 2016-09-25 ENCOUNTER — Encounter: Payer: Self-pay | Admitting: Cardiovascular Disease

## 2016-09-25 VITALS — BP 138/68 | HR 78 | Ht 68.0 in | Wt 286.6 lb

## 2016-09-25 DIAGNOSIS — I214 Non-ST elevation (NSTEMI) myocardial infarction: Secondary | ICD-10-CM

## 2016-09-25 DIAGNOSIS — I5043 Acute on chronic combined systolic (congestive) and diastolic (congestive) heart failure: Secondary | ICD-10-CM

## 2016-09-25 NOTE — Progress Notes (Signed)
Cardiology Office Note Date:  09/28/2016   ID:  [name], DOB 09-Jan-1956, MRN [id number]  PCP:  Minerva Ends, MD  Cardiologist:  Sherren Mocha, MD    Chief Complaint  Patient presents with  . Chest Pain     History of Present Illness: [name] is a 61 y.o. female who presents for follow-up after recent hospitalization with NSTEMI.  She has a complex history with CKD 3, uncontrolled diabetes, congestive heart failure, and hypertension.  She was hospitalized 3/4-3/10/18 with acute on chronic systolic and diastolic heart failure as well as NSTEMI. She ultimately underwent cath and staged PCI of the proximal LAD after being treated for heart failure with IV diuretics. She did well and presents today for follow-up.  She's had a few episodes of left-sided chest pain since discharge but different from what she experienced earlier. No exertional symptoms. No orthopnea or PND. Chest pains are sharp and non-radiating.  She's been compliant with medical therapy.   Past Medical History:  Diagnosis Date  . Asthma    As a child   . Bronchitis   . CAD (coronary artery disease)    a. 09/2016: 50% Ost 1st Mrg stenosis, 50% 2nd Mrg stenosis, 20% Mid-Cx, 95% Prox LAD, 40% mid-LAD, and 10% dist-LAD stenosis. Staged PCI with DES to Prox-LAD.   [name] CHF (congestive heart failure) ([place]) 2011  . Complication of anesthesia   . Diabetes mellitus Dx 1989  . Hepatitis C Dx 2013  . Hypertension Dx 1989  . Obesity   . Pancreatitis 2013  . Refusal of blood transfusions as patient is Jehovah's Witness   . Ulcer ([place]) 2010    Past Surgical History:  Procedure Laterality Date  . CHOLECYSTECTOMY    . CORONARY STENT INTERVENTION N/A 09/18/2016   Procedure: Coronary Stent Intervention;  Surgeon: Troy Sine, MD;  Location: [facility];  Service: Cardiovascular;  Laterality: N/A;  . EYE SURGERY    . KNEE ARTHROSCOPY    . LEFT HEART CATH N/A 09/18/2016   Procedure: Left Heart Cath;   Surgeon: Troy Sine, MD;  Location: [facility];  Service: Cardiovascular;  Laterality: N/A;  . LEFT HEART CATH AND CORONARY ANGIOGRAPHY N/A 09/16/2016   Procedure: Left Heart Cath and Coronary Angiography;  Surgeon: Burnell Blanks, MD;  Location: [facility];  Service: Cardiovascular;  Laterality: N/A;  . TUBAL LIGATION    . TUBAL LIGATION      Current Outpatient Prescriptions  Medication Sig Dispense Refill  . acetaminophen-codeine (TYLENOL #3) 300-30 MG tablet Take 1 tablet by mouth every 8 (eight) hours as needed for moderate pain.    [name] albuterol (PROVENTIL) (2.5 MG/3ML) 0.083% nebulizer solution Take 3 mLs (2.5 mg total) by nebulization every 6 (six) hours as needed for wheezing or shortness of breath. 150 mL 1  . albuterol (VENTOLIN HFA) 108 (90 Base) MCG/ACT inhaler Inhale 2 puffs into the lungs every 4 (four) hours as needed for wheezing or shortness of breath. 54 g 3  . aspirin EC 81 MG EC tablet Take 1 tablet (81 mg total) by mouth daily.    [name] atorvastatin (LIPITOR) 80 MG tablet Take 1 tablet (80 mg total) by mouth daily at 6 PM. 30 tablet 11  . carvedilol (COREG) 12.5 MG tablet Take 2 tablets (25 mg total) by mouth 2 (two) times daily with a meal. 60 tablet 0  . diclofenac sodium (VOLTAREN) 1 % GEL Apply 4 g  topically daily as needed (for pain). 100 g 2  . exenatide (BYETTA 5 MCG PEN) 5 MCG/0.02ML SOPN injection INJECT 5 MCG INTO THE SKIN 2 TIMES DAILY WITH A MEAL. 3 pen 3  . ferrous sulfate 325 (65 FE) MG tablet Take 1 tablet (325 mg total) by mouth 2 (two) times daily with a meal. 60 tablet 3  . fluticasone (FLONASE) 50 MCG/ACT nasal spray Place 2 sprays into both nostrils daily as needed for allergies or rhinitis.    . Fluticasone-Salmeterol (ADVAIR DISKUS) 250-50 MCG/DOSE AEPB Inhale 1 puff into the lungs 2 (two) times daily. 60 each 3  . furosemide (LASIX) 40 MG tablet Take 1 tablet (40 mg total) by mouth daily. 30 tablet 3  . gabapentin (NEURONTIN) 100 MG  capsule Take 2 capsules (200 mg total) by mouth 3 (three) times daily. 180 capsule 2  . hydrALAZINE (APRESOLINE) 50 MG tablet Take 1 tablet (50 mg total) by mouth 3 (three) times daily. 90 tablet 0  . hydrOXYzine (ATARAX/VISTARIL) 50 MG tablet Take 1 tablet (50 mg total) by mouth 3 (three) times daily as needed. 90 tablet 1  . insulin aspart (NOVOLOG) 100 UNIT/ML FlexPen Inject 10 Units into the skin 3 (three) times daily with meals. 30 mL 3  . Insulin Glargine (LANTUS SOLOSTAR) 100 UNIT/ML Solostar Pen Inject 50 Units into the skin every morning.    [name] ketotifen (ZADITOR) 0.025 % ophthalmic solution Place 1 drop into both eyes daily as needed (for dry eyes). 10 mL 2  . ranitidine (ZANTAC) 150 MG tablet Take 1 tablet (150 mg total) by mouth 2 (two) times daily. 60 tablet 3  . sitaGLIPtin (JANUVIA) 50 MG tablet Take 1 tablet (50 mg total) by mouth daily. 90 tablet 3  . ticagrelor (BRILINTA) 90 MG TABS tablet Take 1 tablet (90 mg total) by mouth 2 (two) times daily. 60 tablet 11  . traMADol (ULTRAM) 50 MG tablet Take 1 tablet (50 mg total) by mouth every 6 (six) hours as needed for moderate pain. 10 tablet 0  . valACYclovir (VALTREX) 500 MG tablet Take 1 tablet (500 mg total) by mouth daily. 30 tablet 0   No current facility-administered medications for this visit.     Allergies:   Shellfish allergy; Diazepam; and Morphine and related   Social History:  The patient  reports that she quit smoking about 35 years ago. She has never used smokeless tobacco. She reports that she drinks alcohol. She reports that she does not use drugs.   Family History:  The patient's  family history includes Colon cancer in her mother; Diabetes in her paternal grandmother; Heart attack in her maternal grandmother and other; Hypertension in her brother and sister.    ROS:  Please see the history of present illness.  Otherwise, review of systems is positive for shortness of breath.  All other systems are reviewed and  negative.    PHYSICAL EXAM: VS:  BP 138/68   Pulse 78   Ht 5\' 8"  (1.727 m)   Wt 286 lb 9.6 oz (130 kg)   BMI 43.58 kg/m  , BMI Body mass index is 43.58 kg/m. GEN: Well nourished, well developed, in no acute distress  HEENT: normal  Neck: no JVD, no masses. No carotid bruits Cardiac: RRR without murmur or gallop                Respiratory:  clear to auscultation bilaterally, normal work of breathing GI: soft, nontender, nondistended, + BS MS: no  deformity or atrophy  Ext: no pretibial edema, pedal pulses 2+= bilaterally Skin: warm and dry, no rash Neuro:  Strength and sensation are intact Psych: euthymic mood, full affect  EKG:  EKG is ordered today. The ekg ordered today shows NSR with marked T wave changes, QT prolongation, consider anterior ischemia  Recent Labs: 08/22/2016: Magnesium 1.7 09/13/2016: TSH 1.860 09/19/2016: Hemoglobin 8.9; Platelets 227 09/23/2016: ALT 36; Brain Natriuretic Peptide 137.2; BUN 24; Creat 1.36; Potassium 4.9; Sodium 143   Lipid Panel     Component Value Date/Time   CHOL 122 09/13/2016 1245   TRIG 168 (H) 09/13/2016 1245   HDL 36 (L) 09/13/2016 1245   CHOLHDL 3.4 09/13/2016 1245   VLDL 34 09/13/2016 1245   LDLCALC 52 09/13/2016 1245      Wt Readings from Last 3 Encounters:  09/25/16 286 lb 9.6 oz (130 kg)  09/23/16 294 lb 3.2 oz (133.4 kg)  09/19/16 297 lb 13.5 oz (135.1 kg)     Cardiac Studies Reviewed: 2D Echo 09/14/2016: Study Conclusions  - Left ventricle: Septal and apical hypokinesis The cavity size was   normal. Systolic function was mildly reduced. The estimated   ejection fraction was in the range of 45% to 50%. Wall motion was   normal; there were no regional wall motion abnormalities. Doppler   parameters are consistent with both elevated ventricular   end-diastolic filling pressure and elevated left atrial filling   pressure. - Aortic valve: There was trivial regurgitation. Valve area (VTI):   1.38 cm^2. Valve area  (Vmax): 1.24 cm^2. Valve area (Vmean): 1.27   cm^2. - Left atrium: The atrium was mildly dilated. - Atrial septum: No defect or patent foramen ovale was identified. - Pulmonary arteries: PA peak pressure: 38 mm Hg (S).  Cath 09-16-16: Conclusion     Ost 1st Mrg to 1st Mrg lesion, 50 %stenosed.  Prox LAD to Mid LAD lesion, 95 %stenosed.  Mid LAD lesion, 40 %stenosed.  Dist LAD lesion, 10 %stenosed.  Ost 2nd Mrg to 2nd Mrg lesion, 50 %stenosed.  Mid Cx lesion, 20 %stenosed.   1. Severe stenosis mid LAD 2. Moderate stenosis ostium of both OM1 and OM2.  3. Elevated filling pressures.   Recommendations: She will need staged PCI of the severe stenosis in the mid LAD given her renal insufficiency. Will hydrate gently next 24 hours. Will given one time dose of IV Lasix tonight with elevated filling pressures. Will load with Brilinta 180 mg po x 1 tonight then start Brilinta 90 mg po BID in the am. Continue ASA, statin and beta blocker. Resume IV heparin 6 hours post sheath pull. I will place her on the cath schedule for Friday 09/18/16.      PCI 09-18-2016: Conclusion     Mid LAD lesion, 40 %stenosed.  Dist LAD lesion, 50 %stenosed.  A STENT SYNERGY DES 3X32 drug eluting stent was successfully placed.  Prox LAD lesion, 95 %stenosed.  Post intervention, there is a 0% residual stenosis.   Successful PCI of an eccentric 95%, and tandem 40% and 50-60%stenosis with Angiosculpt scoring balloon, and ultimate DES stenting with a 3.032 mm Synergy DES stent postdilated to 3.26 mm with the entire region of stenosis being reduced to 0%.  LV pressure recording: 120/24  RECOMMENDATION: Ms Brandner will be gently hydrated post procedure with her baseline renal insufficiency.  LVEDP today was improved but elevated at 24, compared to 35 mmHg at diagnostic catheterization. DAPT for a minimum of 1 year and  medical therapy for concomitant CAD.     ASSESSMENT AND PLAN: 1.  NSTEMI: recent  event, appears stable post-PCI. Suspect noncardiac chest pain as her symptoms are different from those during the hospitalization. Continue DAPT, risk reduction measures - medications reviewed today. If she cannot get brilinta can switch her to plavix. Counseling regarding importance of medication compliance is done today.  2. Acute on chronic combined systolic and diastolic CHF: appears to have stabilized. Continue carvedilol and hydralazine. Consider addition of low-dose ACE/ARB at FU visit in 3-4 weeks if creatinine stable. Continue furosemide 40 mg daily.  3. CKD 3: see above. ACE/ARB at FU if creatinine stable.   4. Diabetes with complication: followed by PCP. Medications reviewed.   Current medicines are reviewed with the patient today.  The patient does not have concerns regarding medicines.  Labs/ tests ordered today include:   Orders Placed This Encounter  Procedures  . EKG 12-Lead    Disposition:   FU 3-4 weeks with PA/NP  Signed, Sherren Mocha, MD  09/28/2016 4:45 PM    [facility] [place], [place], [place]  [address] Phone: [phone]; Fax: [phone]

## 2016-09-25 NOTE — Patient Instructions (Signed)
Medication Instructions:  Your physician recommends that you continue on your current medications as directed. Please refer to the Current Medication list given to you today.  Please contact the office if you cannot get Brilinta.   Labwork: No new orders.   Testing/Procedures: No new orders.   Follow-Up: Your physician recommends that you schedule a follow-up appointment in: 3-4 WEEKS with PA/NP   Any Other Special Instructions Will Be Listed Below (If Applicable).     If you need a refill on your cardiac medications before your next appointment, please call your pharmacy.

## 2016-10-01 ENCOUNTER — Ambulatory Visit: Payer: Self-pay | Admitting: Physician Assistant

## 2016-10-02 ENCOUNTER — Telehealth: Payer: Self-pay

## 2016-10-02 NOTE — Telephone Encounter (Addendum)
Call placed to the patient to check on her status. She said that she is doing " okay" except at times, she feels her equilibrium is off and she feels " wobbly."  She denied any chest pain, shortness of breath, headaches.  She stated that basically she feels like she has " no energy."  She said that she is taking all of her medications as ordered, including the lasix 40 mg daily.   She noted that she thinks that she needs a new glucometer and that can be picked up at her appointment next week. She confirmed her [facility] appointment  - 10/07/16 @ 0945.  She stated that she has a scale but has not been weighing herself. Encouraged her to start weighing herself every morning before eating/drinking and to keep a log of the weights. She said that she would start the weights tomorrow morning.  She reported that she has low back pain that is just "there" whether sitting or standing. She did note that the pain was slightly relieved when she moved her bowels. Informed her that Dr Jarold Song would be notified of her concerns.    Reviewed with the patient when she should seek medical attention in the ER and she stated that she was aware.
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
## 2016-10-05 NOTE — Telephone Encounter (Signed)
If back pain is relieved when she moves her bowels, please advise her to use MiraLAX and increase fiber intake.

## 2016-10-06 NOTE — Telephone Encounter (Signed)
Call placed to the patient. She stated that her back pain is gone as she has started eating more.  Discussed the use of miralax and increasing the fiber in her diet to help move her bowels. She also reported that she is less ' wobbly" and has more energy.   She said that she has a scale but needs to get a new one. She also noted that she will need a new glucometer. Instructed her that she can get the glucometer when she comes to the clinic tomorrow. She confirmed her appointment with Dr Jarold Song - 10/07/16 @ 0945. Instructed her to bring all of her medications with her to the appointment and she stated that she would   She then reported that when she sneezes and coughs, she has blood clots in her mucous,not just tinges of blood. No other concerns/questions reported. Informed her that Dr Jarold Song would be notified if her concern about the blood clots.   udpate provided to Dr Jarold Song

## 2016-10-07 ENCOUNTER — Ambulatory Visit: Payer: Medicaid Other | Attending: Family Medicine | Admitting: Family Medicine

## 2016-10-07 ENCOUNTER — Encounter: Payer: Self-pay | Admitting: Family Medicine

## 2016-10-07 VITALS — BP 144/74 | HR 81 | Temp 97.9°F | Ht 68.0 in | Wt 297.0 lb

## 2016-10-07 DIAGNOSIS — Z955 Presence of coronary angioplasty implant and graft: Secondary | ICD-10-CM | POA: Insufficient documentation

## 2016-10-07 DIAGNOSIS — Z885 Allergy status to narcotic agent status: Secondary | ICD-10-CM | POA: Diagnosis not present

## 2016-10-07 DIAGNOSIS — J45909 Unspecified asthma, uncomplicated: Secondary | ICD-10-CM | POA: Insufficient documentation

## 2016-10-07 DIAGNOSIS — E669 Obesity, unspecified: Secondary | ICD-10-CM | POA: Diagnosis not present

## 2016-10-07 DIAGNOSIS — Z888 Allergy status to other drugs, medicaments and biological substances status: Secondary | ICD-10-CM | POA: Insufficient documentation

## 2016-10-07 DIAGNOSIS — N183 Chronic kidney disease, stage 3 unspecified: Secondary | ICD-10-CM

## 2016-10-07 DIAGNOSIS — R42 Dizziness and giddiness: Secondary | ICD-10-CM | POA: Insufficient documentation

## 2016-10-07 DIAGNOSIS — D631 Anemia in chronic kidney disease: Secondary | ICD-10-CM | POA: Diagnosis not present

## 2016-10-07 DIAGNOSIS — E118 Type 2 diabetes mellitus with unspecified complications: Secondary | ICD-10-CM

## 2016-10-07 DIAGNOSIS — E1122 Type 2 diabetes mellitus with diabetic chronic kidney disease: Secondary | ICD-10-CM | POA: Diagnosis not present

## 2016-10-07 DIAGNOSIS — I130 Hypertensive heart and chronic kidney disease with heart failure and stage 1 through stage 4 chronic kidney disease, or unspecified chronic kidney disease: Secondary | ICD-10-CM | POA: Insufficient documentation

## 2016-10-07 DIAGNOSIS — Z6841 Body Mass Index (BMI) 40.0 and over, adult: Secondary | ICD-10-CM | POA: Diagnosis not present

## 2016-10-07 DIAGNOSIS — Z91013 Allergy to seafood: Secondary | ICD-10-CM | POA: Insufficient documentation

## 2016-10-07 DIAGNOSIS — E1165 Type 2 diabetes mellitus with hyperglycemia: Secondary | ICD-10-CM

## 2016-10-07 DIAGNOSIS — Z7902 Long term (current) use of antithrombotics/antiplatelets: Secondary | ICD-10-CM | POA: Insufficient documentation

## 2016-10-07 DIAGNOSIS — I25110 Atherosclerotic heart disease of native coronary artery with unstable angina pectoris: Secondary | ICD-10-CM | POA: Diagnosis not present

## 2016-10-07 DIAGNOSIS — R040 Epistaxis: Secondary | ICD-10-CM | POA: Diagnosis not present

## 2016-10-07 DIAGNOSIS — Z794 Long term (current) use of insulin: Secondary | ICD-10-CM

## 2016-10-07 DIAGNOSIS — Z8619 Personal history of other infectious and parasitic diseases: Secondary | ICD-10-CM | POA: Diagnosis not present

## 2016-10-07 DIAGNOSIS — I10 Essential (primary) hypertension: Secondary | ICD-10-CM

## 2016-10-07 DIAGNOSIS — I5033 Acute on chronic diastolic (congestive) heart failure: Secondary | ICD-10-CM | POA: Insufficient documentation

## 2016-10-07 DIAGNOSIS — E119 Type 2 diabetes mellitus without complications: Secondary | ICD-10-CM | POA: Diagnosis present

## 2016-10-07 DIAGNOSIS — IMO0002 Reserved for concepts with insufficient information to code with codable children: Secondary | ICD-10-CM

## 2016-10-07 LAB — GLUCOSE, POCT (MANUAL RESULT ENTRY)
POC Glucose: 492 mg/dl — AB (ref 70–99)
POC Glucose: 463 mg/dl — AB (ref 70–99)

## 2016-10-07 MED ORDER — TRUEPLUS LANCETS 28G MISC: 1.0000 | Freq: Three times a day (TID) | 12 refills | Status: DC

## 2016-10-07 MED ORDER — TRUE METRIX METER DEVI: 1.0000 | Freq: Three times a day (TID) | 0 refills | Status: DC

## 2016-10-07 MED ORDER — GLUCOSE BLOOD VI STRP: ORAL_STRIP | 12 refills | Status: DC

## 2016-10-07 MED ORDER — MECLIZINE HCL 25 MG PO TABS: 25.0000 mg | ORAL_TABLET | Freq: Three times a day (TID) | ORAL | 2 refills | Status: DC | PRN

## 2016-10-07 MED ORDER — CARVEDILOL 12.5 MG PO TABS: 25.0000 mg | ORAL_TABLET | Freq: Two times a day (BID) | ORAL | 1 refills | Status: DC

## 2016-10-07 MED ORDER — TICAGRELOR 90 MG PO TABS: 90.0000 mg | ORAL_TABLET | Freq: Two times a day (BID) | ORAL | 3 refills | Status: DC

## 2016-10-07 MED ORDER — HYDRALAZINE HCL 50 MG PO TABS: 50.0000 mg | ORAL_TABLET | Freq: Three times a day (TID) | ORAL | 1 refills | Status: DC

## 2016-10-07 MED ORDER — INSULIN ASPART 100 UNIT/ML ~~LOC~~ SOLN
20.0000 [IU] | Freq: Once | SUBCUTANEOUS | Status: AC
  Administered 2016-10-07: 20 [IU] via SUBCUTANEOUS

## 2016-10-07 NOTE — Progress Notes (Signed)
Transitional care clinic  Date of telephone encounter: 09/24/16  Hospitalization dates: 09/13/16-09/19/16  PCP: Dr Adrian Blackwater  Cardiologist: Dr Burt Knack  Subjective:    Patient ID: [name], female    DOB: May 20, 1956, 61 y.o.   MRN: [id number]  HPI Nadege EBELIN DILLEHAY is a 61 year old female with a history of uncontrolled type 2 diabetes mellitus (A1c 8.0) hypertension, CHF (NYHA class IV, EF 45-50%, septal and apical hypokinesis from 09/2016), CAD (s/p staged PCI with DES to proximal LAD during her most recent hospitalization currently on dual antiplatelet therapy with aspirin and Brilinta), asthma, CK D stage III, anemia, GERD.  She had been discharged without Lasix and had presented with severe shortness of breath at rest at her last office visit for which she received 80 mg of Lasix in the clinic as well as nebulizer treatments. She was subsequently prescribed Lasix at her visit.  Interval history She reports significant improvement in her shortness of breath but still has some dyspnea on moderate exertion. Pedal edema has improved and she denies wheezing or chest pain. Last visit to cardiology was 2 weeks ago with the plan to initiate ACE inhibitor/ARB if creatinine is stable.  She complains of episodes of blood-tinged mucus when she blows her nose. Denies blood in stools, hemoptysis. Also complains of dizziness whenever she had suffered from a sitting position or attempts to start walking from rest to: She describes this as "a feeling of off balance". Her blood sugar is elevated today at 492 and she endorses not taking NovoLog or Byetta - 20 units of NovoLog administered.  Past Medical History:  Diagnosis Date  . Asthma    As a child   . Bronchitis   . CAD (coronary artery disease)    a. 09/2016: 50% Ost 1st Mrg stenosis, 50% 2nd Mrg stenosis, 20% Mid-Cx, 95% Prox LAD, 40% mid-LAD, and 10% dist-LAD stenosis. Staged PCI with DES to Prox-LAD.   [name] CHF (congestive heart failure) ([place]) 2011   . Complication of anesthesia   . Diabetes mellitus Dx 1989  . Hepatitis C Dx 2013  . Hypertension Dx 1989  . Obesity   . Pancreatitis 2013  . Refusal of blood transfusions as patient is Jehovah's Witness   . Ulcer ([place]) 2010    Past Surgical History:  Procedure Laterality Date  . CHOLECYSTECTOMY    . CORONARY STENT INTERVENTION N/A 09/18/2016   Procedure: Coronary Stent Intervention;  Surgeon: Troy Sine, MD;  Location: [facility];  Service: Cardiovascular;  Laterality: N/A;  . EYE SURGERY    . KNEE ARTHROSCOPY    . LEFT HEART CATH N/A 09/18/2016   Procedure: Left Heart Cath;  Surgeon: Troy Sine, MD;  Location: [facility];  Service: Cardiovascular;  Laterality: N/A;  . LEFT HEART CATH AND CORONARY ANGIOGRAPHY N/A 09/16/2016   Procedure: Left Heart Cath and Coronary Angiography;  Surgeon: Burnell Blanks, MD;  Location: [facility];  Service: Cardiovascular;  Laterality: N/A;  . TUBAL LIGATION    . TUBAL LIGATION      Allergies  Allergen Reactions  . Shellfish Allergy Anaphylaxis and Swelling  . Diazepam Other (See Comments)    "felt like out of body experience"  . Morphine And Related Itching     Review of Systems  Constitutional: Negative for activity change, appetite change and fatigue.  HENT: Negative for congestion, sinus pressure and sore throat.   Eyes: Negative for visual disturbance.  Respiratory: Positive for shortness  of breath (on moderate exertion). Negative for cough, chest tightness and wheezing.   Cardiovascular: Negative for chest pain and palpitations.  Gastrointestinal: Negative for abdominal distention, abdominal pain and constipation.  Endocrine: Negative for polydipsia.  Genitourinary: Negative for dysuria and frequency.  Musculoskeletal: Negative for arthralgias and back pain.  Skin: Negative for rash.  Neurological: Positive for dizziness. Negative for tremors, light-headedness and numbness.  Hematological: Does  not bruise/bleed easily.  Psychiatric/Behavioral: Negative for agitation and behavioral problems.       Objective: Vitals:   10/07/16 1008  BP: (!) 144/74  Pulse: 81  Temp: 97.9 F (36.6 C)  TempSrc: Oral  SpO2: 99%  Weight: 297 lb (134.7 kg)  Height: 5\' 8"  (1.727 m)   Wt Readings from Last 3 Encounters:  10/07/16 297 lb (134.7 kg)  09/25/16 286 lb 9.6 oz (130 kg)  09/23/16 294 lb 3.2 oz (133.4 kg)       Physical Exam  Constitutional: She is oriented to person, place, and time. She appears well-developed and well-nourished.  HENT:  Right Ear: External ear normal.  Left Ear: External ear normal.  Mouth/Throat: Oropharynx is clear and moist.  Neck: No JVD present.  Cardiovascular: Normal rate, normal heart sounds and intact distal pulses.   No murmur heard. Pulmonary/Chest: Effort normal and breath sounds normal. She has no wheezes. She has no rales. She exhibits no tenderness.  Abdominal: Soft. Bowel sounds are normal. She exhibits no distension and no mass. There is no tenderness.  Musculoskeletal: Normal range of motion.  Neurological: She is alert and oriented to person, place, and time.  Skin: Skin is warm and dry.  Psychiatric: She has a normal mood and affect.      Lab Results  Component Value Date   HGBA1C 8.0 (H) 09/13/2016    CMP Latest Ref Rng & Units 09/23/2016 09/19/2016 09/18/2016  Glucose 65 - 99 mg/dL 116(H) 217(H) 171(H)  BUN 7 - 25 mg/dL 24 31(H) 30(H)  Creatinine 0.50 - 1.05 mg/dL 1.36(H) 1.87(H) 1.54(H)  Sodium 135 - 146 mmol/L 143 137 139  Potassium 3.5 - 5.3 mmol/L 4.9 4.0 3.3(L)  Chloride 98 - 110 mmol/L 112(H) 108 108  CO2 20 - 31 mmol/L 23 17(L) 21(L)  Calcium 8.6 - 10.4 mg/dL 9.0 7.7(L) 8.3(L)  Total Protein 6.1 - 8.1 g/dL 6.9 - -  Total Bilirubin 0.2 - 1.2 mg/dL 0.5 - -  Alkaline Phos 33 - 130 U/L 212(H) - -  AST 10 - 35 U/L 25 - -  ALT 6 - 29 U/L 36(H) - -        Assessment & Plan:  1. Uncontrolled type 2 diabetes mellitus with  complication, without long-term current use of insulin (HCC) A1c of 8.0 Not fully optimized as goal A1c is less than 7 Novolog 20 units administered due to blood sugar of 492; she hasn't observed for 45 minutes and blood sugar repeated. Continue current medications, diabetic diet, lifestyle modifications and weight loss - Glucose (CBG) - COMPLETE METABOLIC PANEL WITH GFR  2. Uncomplicated asthma, unspecified asthma severity, unspecified whether persistent No acute exacerbation Continue rescue inhaler - Fluticasone-Salmeterol (ADVAIR DISKUS) 250-50 MCG/DOSE AEPB; Inhale 1 puff into the lungs 2 (two) times daily.  Dispense: 60 each; Refill: 3  3. Vertigo Placed on meclizine Advised to change positions slowly  4. Acute on chronic diastolic congestive heart failure, NYHA class 4 (HCC) EF of 45-50% from 2d echo of 09/2016 Euvolemic and shortness of breath has improved Weight seems to  be fluctuating- 294, 286, 297 pounds over the last 2 weeks She still has some residual dyspnea on exertion Caution with regards to uptitration of Lasix due to CKD Check creatinine today. Weight checks, limit fluid intake to 2 L per day, low-sodium diet Keep appointment with cardiology.   5. Anemia in stage 3 chronic kidney disease May need referral to nephrology if creatinine trends up  6. Essential hypertension Controlled Low-sodium, DASH diet, lifestyle modification  7. Coronary artery disease involving native coronary artery of native heart with unstable angina pectoris [facility]) s/p staged PCI with DES to prox LAD Continue dual antiplatelet therapy for at least 12 months  8. Epistaxis This is self-limiting Maybe related to recent onset of Brilinta - have discussed the risk and benefit of anticoagulation and at this time the benefit outweighs the risks. Discussed measures to stop epistaxis if this occurs Patient to notify the clinic if persistent

## 2016-10-07 NOTE — Progress Notes (Signed)
Med refills- novolog, lantus, byetta, tylenol #3, new diabetic supplies

## 2016-10-07 NOTE — Telephone Encounter (Signed)
Addressed at office visit

## 2016-10-08 ENCOUNTER — Other Ambulatory Visit: Payer: Self-pay | Admitting: Family Medicine

## 2016-10-08 DIAGNOSIS — N183 Chronic kidney disease, stage 3 unspecified: Secondary | ICD-10-CM

## 2016-10-08 DIAGNOSIS — D631 Anemia in chronic kidney disease: Secondary | ICD-10-CM

## 2016-10-08 LAB — CMP14+EGFR
ALT: 32 IU/L (ref 0–32)
AST: 25 IU/L (ref 0–40)
Albumin/Globulin Ratio: 1.2 (ref 1.2–2.2)
Albumin: 3.6 g/dL (ref 3.5–5.5)
Alkaline Phosphatase: 239 IU/L — ABNORMAL HIGH (ref 39–117)
BUN/Creatinine Ratio: 26 — ABNORMAL HIGH (ref 9–23)
BUN: 52 mg/dL — ABNORMAL HIGH (ref 6–24)
Bilirubin Total: 0.2 mg/dL (ref 0.0–1.2)
CO2: 21 mmol/L (ref 18–29)
Calcium: 8.8 mg/dL (ref 8.7–10.2)
Chloride: 98 mmol/L (ref 96–106)
Creatinine, Ser: 2.02 mg/dL — ABNORMAL HIGH (ref 0.57–1.00)
GFR calc Af Amer: 31 mL/min/{1.73_m2} — ABNORMAL LOW (ref >59)
GFR calc non Af Amer: 27 mL/min/{1.73_m2} — ABNORMAL LOW (ref >59)
Globulin, Total: 3 g/dL (ref 1.5–4.5)
Glucose: 527 mg/dL (ref 65–99)
Potassium: 4.5 mmol/L (ref 3.5–5.2)
Sodium: 136 mmol/L (ref 134–144)
Total Protein: 6.6 g/dL (ref 6.0–8.5)

## 2016-10-12 MED FILL — ACETAMINOPHEN/COD #3 TABLET: 300-30 | 30 days supply | Qty: 90 | Fill #1

## 2016-10-12 MED FILL — TRUE METRIX TEST STRIP: 33 days supply | Qty: 100 | Fill #0

## 2016-10-12 MED FILL — TRAVEL SICKNESS 25 MG TAB C: 25 | 20 days supply | Qty: 60 | Fill #0

## 2016-10-12 MED FILL — CARVEDILOL 12.5 MG TABLET: 12.5 | 15 days supply | Qty: 60 | Fill #0

## 2016-10-12 MED FILL — hydrALAZINE HCL 50 MG TABS: 50 | 30 days supply | Qty: 90 | Fill #0

## 2016-10-12 MED FILL — TRUE METRIX BLOOD GLUCOSE M: W/DEVICE | 365 days supply | Qty: 1 | Fill #0

## 2016-10-12 MED FILL — TRUEplus LANCETS 28G MISC: 33 days supply | Qty: 100 | Fill #0

## 2016-10-13 ENCOUNTER — Telehealth: Payer: Self-pay

## 2016-10-13 NOTE — Telephone Encounter (Signed)
-----   Message from Arnoldo Morale, MD sent at 09/24/2016  1:26 PM EDT ----- Kidney function has improved slightly

## 2016-10-13 NOTE — Telephone Encounter (Signed)
Writer called patient per Dr. Jarold Song and discussed lab results.  Patient stated understanding and will f/u with Dr. Adrian Blackwater.

## 2016-10-16 ENCOUNTER — Telehealth: Payer: Self-pay

## 2016-10-16 DIAGNOSIS — I5032 Chronic diastolic (congestive) heart failure: Secondary | ICD-10-CM

## 2016-10-16 DIAGNOSIS — I5033 Acute on chronic diastolic (congestive) heart failure: Secondary | ICD-10-CM

## 2016-10-16 NOTE — Telephone Encounter (Signed)
Pt contacted the office and stated she would like to speak to the nurse and I informed pt that she is in clinic. Pt states she would like to schedule an appointment with Dr. Adrian Blackwater. I informed pt that she would have to call April 23 to schedule an appointment. Pt then states she recently had heart surgery and she is having a lot of fluid and she can't hardly walk. Pt states she was taken off the second dose of lasix so she only takes it in the morning. I informed pt that she will need to go to the ED especially with the fluid build up and recently having heart surgery. pt states she understands and she greatly appreciates it

## 2016-10-19 ENCOUNTER — Encounter: Payer: Self-pay | Admitting: Physician Assistant

## 2016-10-19 ENCOUNTER — Telehealth: Payer: Self-pay | Admitting: Cardiovascular Disease

## 2016-10-19 NOTE — Telephone Encounter (Signed)
Left pt a message to call back. 

## 2016-10-19 NOTE — Telephone Encounter (Signed)
New message    Patient calling the office for samples of medication:   1.  What medication and dosage are you requesting samples for? ticagrelor (BRILINTA) 90 MG TABS tablet  2.  Are you currently out of this medication? Yes

## 2016-10-19 NOTE — Telephone Encounter (Signed)
Will route to PCP 

## 2016-10-19 NOTE — Telephone Encounter (Signed)
Pt is aware that a 2 weeks of samples of Brillinta 90 mg twice daily. Samples were placed at the front desk. Pt is aware.   Pt added that she  has been in the hospital.  Pt asked for Dr Burt Knack to write a not with the date of her Admission, discharge and what procedure pt had done while in the hospital. Note  to be placed at the front desk for her to peak up.

## 2016-10-20 MED ORDER — FUROSEMIDE 40 MG PO TABS: 40.0000 mg | ORAL_TABLET | Freq: Every day | ORAL | 3 refills | Status: DC

## 2016-10-20 NOTE — Telephone Encounter (Signed)
Follow Up:   Pt called to see if her note was ready for her to pick up?

## 2016-10-20 NOTE — Addendum Note (Signed)
Addended by: Boykin Nearing on: 10/20/2016 06:03 PM   Modules accepted: Orders

## 2016-10-20 NOTE — Telephone Encounter (Signed)
Called to patient Verified name and DOB She is having leg swelling and weight gain She had some shortness of breath last week that has improved from last week   Her weight today was 298#   Plan: Increase lasix to 40 mg at 8 AM with extra 40 mg at 2 PM prn weight > 3lbs from previous day or worsening leg swelling  Patient with CKD Will need f/u next week with me for labs, weight check, BP check  Please schedule patient at 9:30 AM (same day appt) on 10/26/2016 for f/u HTN, weight check, renal function

## 2016-10-21 MED FILL — FUROSEMIDE 40 MG TABLET: 40 | 30 days supply | Qty: 30 | Fill #0

## 2016-10-21 NOTE — Telephone Encounter (Signed)
Letter completed. I spoke with the pt and made her aware that letter is ready and can be picked up at the front desk.

## 2016-10-22 ENCOUNTER — Other Ambulatory Visit: Payer: Self-pay | Admitting: Family Medicine

## 2016-10-22 DIAGNOSIS — E119 Type 2 diabetes mellitus without complications: Secondary | ICD-10-CM

## 2016-10-22 MED FILL — $ADVAIR 250/50 MCG INHALER: 250-50 | 30 days supply | Qty: 60 | Fill #1

## 2016-10-22 MED FILL — $novoLOG 100 UNITS/ML VIAL: 100 | 33 days supply | Qty: 10 | Fill #0

## 2016-10-22 MED FILL — $BYETTA 5 MCG DOSE PEN INJ: 5 | 30 days supply | Qty: 1 | Fill #0

## 2016-10-22 MED FILL — $VENTOLIN HFA 18G INHALER: 108 (90 BAS | 16 days supply | Qty: 18 | Fill #1

## 2016-10-23 ENCOUNTER — Other Ambulatory Visit: Payer: Self-pay | Admitting: Family Medicine

## 2016-10-23 DIAGNOSIS — A6000 Herpesviral infection of urogenital system, unspecified: Secondary | ICD-10-CM

## 2016-10-23 MED FILL — ?AMLODIPINE BESYLATE 5 MG T: 5 | 30 days supply | Qty: 30 | Fill #1

## 2016-10-23 MED FILL — CARVEDILOL 25 MG TABLET: 25 | 30 days supply | Qty: 60 | Fill #2

## 2016-10-26 ENCOUNTER — Encounter: Payer: Self-pay | Admitting: Family Medicine

## 2016-10-26 ENCOUNTER — Ambulatory Visit: Payer: Medicaid Other | Attending: Family Medicine | Admitting: Family Medicine

## 2016-10-26 VITALS — BP 145/80 | HR 69 | Temp 97.7°F | Ht 68.0 in | Wt 293.6 lb

## 2016-10-26 DIAGNOSIS — I25110 Atherosclerotic heart disease of native coronary artery with unstable angina pectoris: Secondary | ICD-10-CM | POA: Diagnosis not present

## 2016-10-26 DIAGNOSIS — Z794 Long term (current) use of insulin: Secondary | ICD-10-CM | POA: Diagnosis not present

## 2016-10-26 DIAGNOSIS — Z7951 Long term (current) use of inhaled steroids: Secondary | ICD-10-CM | POA: Diagnosis not present

## 2016-10-26 DIAGNOSIS — E1165 Type 2 diabetes mellitus with hyperglycemia: Secondary | ICD-10-CM | POA: Diagnosis not present

## 2016-10-26 DIAGNOSIS — Z87891 Personal history of nicotine dependence: Secondary | ICD-10-CM | POA: Insufficient documentation

## 2016-10-26 DIAGNOSIS — E1122 Type 2 diabetes mellitus with diabetic chronic kidney disease: Secondary | ICD-10-CM | POA: Diagnosis not present

## 2016-10-26 DIAGNOSIS — Z7902 Long term (current) use of antithrombotics/antiplatelets: Secondary | ICD-10-CM | POA: Insufficient documentation

## 2016-10-26 DIAGNOSIS — N183 Chronic kidney disease, stage 3 unspecified: Secondary | ICD-10-CM

## 2016-10-26 DIAGNOSIS — I130 Hypertensive heart and chronic kidney disease with heart failure and stage 1 through stage 4 chronic kidney disease, or unspecified chronic kidney disease: Secondary | ICD-10-CM | POA: Insufficient documentation

## 2016-10-26 DIAGNOSIS — I5032 Chronic diastolic (congestive) heart failure: Secondary | ICD-10-CM | POA: Diagnosis not present

## 2016-10-26 DIAGNOSIS — Z6841 Body Mass Index (BMI) 40.0 and over, adult: Secondary | ICD-10-CM | POA: Insufficient documentation

## 2016-10-26 DIAGNOSIS — Z79899 Other long term (current) drug therapy: Secondary | ICD-10-CM | POA: Diagnosis not present

## 2016-10-26 DIAGNOSIS — E669 Obesity, unspecified: Secondary | ICD-10-CM | POA: Diagnosis not present

## 2016-10-26 DIAGNOSIS — IMO0002 Reserved for concepts with insufficient information to code with codable children: Secondary | ICD-10-CM

## 2016-10-26 DIAGNOSIS — I252 Old myocardial infarction: Secondary | ICD-10-CM | POA: Insufficient documentation

## 2016-10-26 DIAGNOSIS — E119 Type 2 diabetes mellitus without complications: Secondary | ICD-10-CM | POA: Diagnosis present

## 2016-10-26 DIAGNOSIS — E118 Type 2 diabetes mellitus with unspecified complications: Secondary | ICD-10-CM

## 2016-10-26 DIAGNOSIS — Z7982 Long term (current) use of aspirin: Secondary | ICD-10-CM | POA: Insufficient documentation

## 2016-10-26 LAB — POCT URINALYSIS DIPSTICK
Bilirubin, UA: NEGATIVE
Blood, UA: NEGATIVE
Glucose, UA: 500
Ketones, UA: NEGATIVE
Leukocytes, UA: NEGATIVE
Nitrite, UA: NEGATIVE
Protein, UA: >=300
Spec Grav, UA: 1.015 (ref 1.010–1.025)
Urobilinogen, UA: 0.2 E.U./dL
pH, UA: 5 (ref 5.0–8.0)

## 2016-10-26 LAB — GLUCOSE, POCT (MANUAL RESULT ENTRY)
POC Glucose: 495 mg/dl — AB (ref 70–99)
POC Glucose: 483 mg/dl — AB (ref 70–99)

## 2016-10-26 MED ORDER — INSULIN GLARGINE 100 UNIT/ML SOLOSTAR PEN: 50.0000 [IU] | PEN_INJECTOR | SUBCUTANEOUS | 5 refills | Status: DC

## 2016-10-26 MED ORDER — INSULIN ASPART 100 UNIT/ML ~~LOC~~ SOLN
20.0000 [IU] | Freq: Once | SUBCUTANEOUS | Status: AC
Start: 2016-10-26 — End: 2016-10-26
  Administered 2016-10-26: 20 [IU] via SUBCUTANEOUS

## 2016-10-26 MED ORDER — INSULIN ASPART 100 UNIT/ML FLEXPEN: 10.0000 [IU] | PEN_INJECTOR | Freq: Three times a day (TID) | SUBCUTANEOUS | 3 refills | Status: DC

## 2016-10-26 MED ORDER — TICAGRELOR 90 MG PO TABS: 90.0000 mg | ORAL_TABLET | Freq: Two times a day (BID) | ORAL | 5 refills | Status: DC

## 2016-10-26 MED FILL — BRILINTA 90 MG TABLET: 90 | 30 days supply | Qty: 60 | Fill #0

## 2016-10-26 NOTE — Assessment & Plan Note (Signed)
A: diastolic CHF with edema and CKD P: BMP with GFR Continue lasix 40 mg daily with plan to increase to 40 mg BID if renal function is stable

## 2016-10-26 NOTE — Patient Instructions (Addendum)
[name] was seen today for diabetes.  Diagnoses and all orders for this visit:  Uncontrolled type 2 diabetes mellitus with complication, with long-term current use of insulin (HCC) -     Glucose (CBG) -     Microalbumin/Creatinine Ratio, Urine -     POCT urinalysis dipstick -     Amb Referral to Nutrition and Diabetic E  CKD (chronic kidney disease) stage 3, GFR 30-59 ml/min -     [id number]  Chronic diastolic CHF (congestive heart failure), NYHA class 2 (HCC)  Coronary artery disease involving native coronary artery of native heart with unstable angina pectoris (HCC) -     ticagrelor (BRILINTA) 90 MG TABS tablet; Take 1 tablet (90 mg total) by mouth 2 (two) times daily. -     Amb Referral to Nutrition and Diabetic E  Type 2 diabetes mellitus with hyperglycemia, with long-term current use of insulin (HCC) -     insulin aspart (novoLOG) injection 20 Units; Inject 0.2 mLs (20 Units total) into the skin once.  Diabetes blood sugar goals  Fasting (in AM before breakfast, 8 hrs of no eating or drinking (except water or unsweetened coffee or tea): 90-130 2 hrs after meals: < 160,   No low sugars: nothing < 70   f/u in 3 weeks with blood sugar log for diabetes and chronic CHF (systolic and diastolic)   Dr. Adrian Blackwater

## 2016-10-26 NOTE — Progress Notes (Signed)
MA contacted the patient to have her return for her blood work today. Medical Assistant left message on patient's home and cell voicemail. Voicemail states to give a call back to [place] with [facility] at [phone].

## 2016-10-26 NOTE — Assessment & Plan Note (Signed)
A: uncontrolled with CKD P: Continue novolog 10 U TID lantus 50 U daily byetta 5 mcg BID januvia 50 mg daily Low carb diet

## 2016-10-26 NOTE — Progress Notes (Signed)
Subjective:  Patient ID: [name], female    DOB: 05-09-1956  Age: 61 y.o. MRN: [id number]  CC: Diabetes   HPI NARELY NOBLES has diabetes, HTN, CAD, recent NSTEMI,  CKD stage 3 mixed systolic and diastolic CHF, obesity she presents for    1. Mixed systolic and diastolic CHF: taking lasix 40 mg in morning. Reports she is having some shortness of breath. Reports leg swelling has improved . Denies chest pain. She is feeling fatigue.   2. Diabetes:  CBGs 209-410 Last took novolog yesterday afternoon. Reports she ran out of novolog.  Last took lantus 2 nights ago.  Taking byetta 5 mcg  BID reports appetite is still high.  Taking januvia 50 mg daily   Social History  Substance Use Topics  . Smoking status: Former Smoker    Quit date: 10/25/1980  . Smokeless tobacco: Never Used     Comment: quit smoking in 1982  . Alcohol use Yes     Comment: 3 times in last year    Outpatient Medications Prior to Visit  Medication Sig Dispense Refill  . acetaminophen-codeine (TYLENOL #3) 300-30 MG tablet Take 1 tablet by mouth every 8 (eight) hours as needed for moderate pain.    [name] albuterol (PROVENTIL) (2.5 MG/3ML) 0.083% nebulizer solution Take 3 mLs (2.5 mg total) by nebulization every 6 (six) hours as needed for wheezing or shortness of breath. 150 mL 1  . albuterol (VENTOLIN HFA) 108 (90 Base) MCG/ACT inhaler Inhale 2 puffs into the lungs every 4 (four) hours as needed for wheezing or shortness of breath. 54 g 3  . aspirin EC 81 MG EC tablet Take 1 tablet (81 mg total) by mouth daily.    [name] atorvastatin (LIPITOR) 80 MG tablet Take 1 tablet (80 mg total) by mouth daily at 6 PM. 30 tablet 11  . Blood Glucose Monitoring Suppl (TRUE METRIX METER) DEVI 1 each by Does not apply route 3 (three) times daily before meals. 1 Device 0  . carvedilol (COREG) 12.5 MG tablet Take 2 tablets (25 mg total) by mouth 2 (two) times daily with a meal. 60 tablet 1  . diclofenac sodium (VOLTAREN) 1 % GEL Apply 4 g  topically daily as needed (for pain). 100 g 2  . exenatide (BYETTA 5 MCG PEN) 5 MCG/0.02ML SOPN injection INJECT 5 MCG INTO THE SKIN 2 TIMES DAILY WITH A MEAL. 3 pen 3  . ferrous sulfate 325 (65 FE) MG tablet Take 1 tablet (325 mg total) by mouth 2 (two) times daily with a meal. 60 tablet 3  . fluticasone (FLONASE) 50 MCG/ACT nasal spray Place 2 sprays into both nostrils daily as needed for allergies or rhinitis.    . Fluticasone-Salmeterol (ADVAIR DISKUS) 250-50 MCG/DOSE AEPB Inhale 1 puff into the lungs 2 (two) times daily. 60 each 3  . furosemide (LASIX) 40 MG tablet Take 1 tablet (40 mg total) by mouth daily. Take an extra 40 mg at 2 PM if weight over 3 lbs or increased swelling 60 tablet 3  . gabapentin (NEURONTIN) 100 MG capsule Take 2 capsules (200 mg total) by mouth 3 (three) times daily. (Patient not taking: Reported on 10/07/2016) 180 capsule 2  . glucose blood (TRUE METRIX BLOOD GLUCOSE TEST) test strip Use 3 times before meals 100 each 12  . hydrALAZINE (APRESOLINE) 50 MG tablet Take 1 tablet (50 mg total) by mouth 3 (three) times daily. 90 tablet 1  . hydrOXYzine (ATARAX/VISTARIL) 50 MG tablet Take 1  tablet (50 mg total) by mouth 3 (three) times daily as needed. 90 tablet 1  . insulin aspart (NOVOLOG) 100 UNIT/ML FlexPen Inject 10 Units into the skin 3 (three) times daily with meals. 30 mL 3  . Insulin Glargine (LANTUS SOLOSTAR) 100 UNIT/ML Solostar Pen Inject 50 Units into the skin every morning.    [name] ketotifen (ZADITOR) 0.025 % ophthalmic solution Place 1 drop into both eyes daily as needed (for dry eyes). 10 mL 2  . meclizine (ANTIVERT) 25 MG tablet Take 1 tablet (25 mg total) by mouth 3 (three) times daily as needed. 60 tablet 2  . ranitidine (ZANTAC) 150 MG tablet Take 1 tablet (150 mg total) by mouth 2 (two) times daily. 60 tablet 3  . sitaGLIPtin (JANUVIA) 50 MG tablet Take 1 tablet (50 mg total) by mouth daily. 90 tablet 3  . ticagrelor (BRILINTA) 90 MG TABS tablet Take 1 tablet  (90 mg total) by mouth 2 (two) times daily. 180 tablet 3  . traMADol (ULTRAM) 50 MG tablet Take 1 tablet (50 mg total) by mouth every 6 (six) hours as needed for moderate pain. 10 tablet 0  . TRUEPLUS LANCETS 28G MISC 1 each by Does not apply route 3 (three) times daily before meals. 100 each 12  . valACYclovir (VALTREX) 500 MG tablet Take 1 tablet (500 mg total) by mouth daily. 30 tablet 0   No facility-administered medications prior to visit.     ROS Review of Systems  Constitutional: Positive for fatigue. Negative for chills and fever.  Eyes: Negative for visual disturbance.  Respiratory: Positive for shortness of breath.   Cardiovascular: Positive for leg swelling. Negative for chest pain.  Gastrointestinal: Negative for abdominal pain and blood in stool.  Musculoskeletal: Negative for arthralgias and back pain.  Skin: Negative for rash.  Allergic/Immunologic: Negative for immunocompromised state.  Neurological:       Forgetfulness  Hematological: Negative for adenopathy. Does not bruise/bleed easily.  Psychiatric/Behavioral: Negative for dysphoric mood and suicidal ideas.    Objective:  BP (!) 145/80   Pulse 69   Temp 97.7 F (36.5 C) (Oral)   Ht _0  (1.727 m)   Wt 293 lb 9.6 oz (133.2 kg)   SpO2 97%   BMI 44.64 kg/m   BP/Weight 10/07/2016 09/25/2016 6/75/9163  Systolic BP 846 659 935  Diastolic BP 74 68 70  Wt. (Lbs) 297 286.6 294.2  BMI 45.16 43.58 44.73   Wt Readings from Last 3 Encounters:  10/26/16 293 lb 9.6 oz (133.2 kg)  10/07/16 297 lb (134.7 kg)  09/25/16 286 lb 9.6 oz (130 kg)     Physical Exam  Constitutional: She is oriented to person, place, and time. She appears well-developed and well-nourished. No distress.  Obese   HENT:  Head: Normocephalic and atraumatic.  Cardiovascular: Normal rate, regular rhythm, normal heart sounds and intact distal pulses.   Pulmonary/Chest: Effort normal and breath sounds normal.  Musculoskeletal: She exhibits  edema (1+ in b/l LE ).  Neurological: She is alert and oriented to person, place, and time.  Skin: Skin is warm and dry. No rash noted.  Psychiatric: She has a normal mood and affect.   Lab Results  Component Value Date   HGBA1C 8.0 (H) 09/13/2016   CBG 495 UA: 500 glucose, > 300 protein  Treated with 20 U of novolog  Repeat CBG 483  Assessment & Plan:  Brezlyn was seen today for diabetes.  Diagnoses and all orders for this visit:  Uncontrolled type 2 diabetes mellitus with complication, with long-term current use of insulin (HCC) -     Glucose (CBG) -     Microalbumin/Creatinine Ratio, Urine -     POCT urinalysis dipstick -     Amb Referral to Nutrition and Diabetic E -     Glucose (CBG) -     Insulin Glargine (LANTUS SOLOSTAR) 100 UNIT/ML Solostar Pen; Inject 50 Units into the skin every morning.  CKD (chronic kidney disease) stage 3, GFR 30-59 ml/min -     MGQ6+PYPP  Chronic diastolic CHF (congestive heart failure), NYHA class 2 (HCC)  Coronary artery disease involving native coronary artery of native heart with unstable angina pectoris (HCC) -     ticagrelor (BRILINTA) 90 MG TABS tablet; Take 1 tablet (90 mg total) by mouth 2 (two) times daily. -     Amb Referral to Nutrition and Diabetic E  Type 2 diabetes mellitus with hyperglycemia, with long-term current use of insulin (HCC) -     insulin aspart (novoLOG) injection 20 Units; Inject 0.2 mLs (20 Units total) into the skin once.  Other orders -     insulin aspart (NOVOLOG) 100 UNIT/ML FlexPen; Inject 10 Units into the skin 3 (three) times daily with meals.   There are no diagnoses linked to this encounter.  No orders of the defined types were placed in this encounter.   Follow-up: Return in about 3 weeks (around 11/16/2016) for diabetes .   Boykin Nearing MD

## 2016-10-27 ENCOUNTER — Telehealth: Payer: Self-pay | Admitting: Family Medicine

## 2016-10-27 DIAGNOSIS — IMO0002 Reserved for concepts with insufficient information to code with codable children: Secondary | ICD-10-CM

## 2016-10-27 DIAGNOSIS — E1165 Type 2 diabetes mellitus with hyperglycemia: Secondary | ICD-10-CM

## 2016-10-27 DIAGNOSIS — Z794 Long term (current) use of insulin: Principal | ICD-10-CM

## 2016-10-27 DIAGNOSIS — E118 Type 2 diabetes mellitus with unspecified complications: Principal | ICD-10-CM

## 2016-10-27 LAB — MICROALBUMIN / CREATININE URINE RATIO
Creatinine, Urine: 52.4 mg/dL
Microalb/Creat Ratio: 1423.1 mg/g creat — ABNORMAL HIGH (ref 0.0–30.0)
Microalbumin, Urine: 745.7 ug/mL

## 2016-10-27 LAB — BMP8+EGFR
BUN/Creatinine Ratio: 30 — ABNORMAL HIGH (ref 9–23)
BUN: 67 mg/dL — ABNORMAL HIGH (ref 6–24)
CO2: 17 mmol/L — ABNORMAL LOW (ref 18–29)
Calcium: 8.9 mg/dL (ref 8.7–10.2)
Chloride: 95 mmol/L — ABNORMAL LOW (ref 96–106)
Creatinine, Ser: 2.24 mg/dL — ABNORMAL HIGH (ref 0.57–1.00)
GFR calc Af Amer: 27 mL/min/{1.73_m2} — ABNORMAL LOW (ref >59)
GFR calc non Af Amer: 23 mL/min/{1.73_m2} — ABNORMAL LOW (ref >59)
Glucose: 542 mg/dL (ref 65–99)
Potassium: 5 mmol/L (ref 3.5–5.2)
Sodium: 133 mmol/L — ABNORMAL LOW (ref 134–144)

## 2016-10-27 MED ORDER — INSULIN ASPART 100 UNIT/ML FLEXPEN: 20.0000 [IU] | PEN_INJECTOR | Freq: Three times a day (TID) | SUBCUTANEOUS | 3 refills | Status: DC

## 2016-10-27 MED ORDER — INSULIN GLARGINE 100 UNIT/ML SOLOSTAR PEN: 60.0000 [IU] | PEN_INJECTOR | SUBCUTANEOUS | 5 refills | Status: DC

## 2016-10-27 MED FILL — ?VALACYCLOVIR HCL 500MG TAB: 500 | 30 days supply | Qty: 30 | Fill #0

## 2016-10-27 NOTE — Telephone Encounter (Signed)
Called back to patient Verified name and DOB She checked her  Sugar while we were on the phone  Reading was "high" She is drinking water She denies nausea, emesis, abdominal pain and blurry vision  She does not have transportation to get to ED or Urgent care right now  Take an extra 5 Units of novolog Increase water intake  Eat fish and broccoli for dinner 20 U of novolog with dinner  60 U of lantus at bedtime Continue byetta  If CBG are not below 500 tomorrow she is advised to seek treatment with insulin and IV fluids She is advised to come to [facility] to seek same day visit or to urgent care. She is also advised to call 911 tonight if she begins to feel ill.   She agrees with plan and voices understanding.

## 2016-10-27 NOTE — Telephone Encounter (Signed)
Dr. Adrian Blackwater aware, instructed patient to be evaluated for hyperglycemia at urgent care or ED. Pt states she does not have transportation at the time, " they took my car. Pt verbalized understanding of directions to seek medical attention.

## 2016-10-27 NOTE — Telephone Encounter (Signed)
Pt reports her CBG is 577. She took 15 units of Novolog at 3pm. Then she ate grapes and a sandwich and water. Earlier CBG was 512. She had 10 units Novolog this a.m.  States she went to sleep this morning does not feel well., Vision was blurry. Denies N/V.

## 2016-10-29 MED FILL — $novoLOG 100 UNITS/ML VIAL: 100 | 30 days supply | Qty: 20 | Fill #0

## 2016-10-29 MED FILL — !LANTUS SOLOSTAR 100UNITS/M: 100 | 30 days supply | Qty: 18 | Fill #0

## 2016-10-30 ENCOUNTER — Telehealth: Payer: Self-pay

## 2016-10-30 NOTE — Telephone Encounter (Signed)
Pt was called and informed of lab results. 

## 2016-11-02 MED FILL — TRAVEL SICKNESS 25 MG TAB C: 25 | 20 days supply | Qty: 60 | Fill #1

## 2016-11-02 MED FILL — ATORVASTATIN 80 MG TABLET: 80 | 30 days supply | Qty: 30 | Fill #1

## 2016-11-05 ENCOUNTER — Ambulatory Visit: Payer: Self-pay | Admitting: Physician Assistant

## 2016-11-06 ENCOUNTER — Ambulatory Visit: Payer: Self-pay | Admitting: Family Medicine

## 2016-11-06 ENCOUNTER — Other Ambulatory Visit: Payer: Self-pay

## 2016-11-06 DIAGNOSIS — I25110 Atherosclerotic heart disease of native coronary artery with unstable angina pectoris: Secondary | ICD-10-CM

## 2016-11-06 MED ORDER — TICAGRELOR 90 MG PO TABS: 90.0000 mg | ORAL_TABLET | Freq: Two times a day (BID) | ORAL | 3 refills | Status: DC

## 2016-11-09 NOTE — Progress Notes (Addendum)
Cardiology Office Note    Date:  11/10/2016   ID:  [name], DOB 05-Aug-1955, MRN [id number]  PCP:  Minerva Ends, MD  Cardiologist:  Dr. Burt Knack  CC: follow up   History of Present Illness:  [name] is a 61 y.o. female with a history of CKD 3, uncontrolled diabetes, chronic combined S/D CHF, CAD s/p DES to LAD and hypertension who presents to clinic for follow up.   She was hospitalized 3/4-3/10/18 with acute on chronic systolic and diastolic heart failure as well as NSTEMI. She ultimately underwent cath and staged PCI of the proximal LAD after being treated for heart failure with IV diuretics.  She was seen by Dr. Burt Knack in the office on 09/25/16 for evaluation of chest pain. It was felt to be non cardiac given that it was different than previous chest pain. She was felt to be euvolemic. Weight was 286lbs. Plan was for her to return in 4 weeks to add ACE/ARB if renal function stable.  Today she presents to clinic for follow up. She occasionally gets some chest tightness with exertion that quickly goes away with rest. It does not bother her too much . She has chronic dyspnea on exertion and sometimes she gets SOB when talking. She has been avoiding salt. Sometimes gets some LE edema that comes and goes. Her weight keeps going up and so she has been taking lasix 40mg  BID. No orthopnea or PND. She would like to know when she can go back to the gym.  Past Medical History:  Diagnosis Date  . Asthma    As a child   . Bronchitis   . CAD (coronary artery disease)    a. 09/2016: 50% Ost 1st Mrg stenosis, 50% 2nd Mrg stenosis, 20% Mid-Cx, 95% Prox LAD, 40% mid-LAD, and 10% dist-LAD stenosis. Staged PCI with DES to Prox-LAD.   [name] CHF (congestive heart failure) ([place]) 2011  . Complication of anesthesia   . Diabetes mellitus Dx 1989  . Hepatitis C Dx 2013  . Hypertension Dx 1989  . Obesity   . Pancreatitis 2013  . Refusal of blood transfusions as patient is Jehovah's Witness   .  Ulcer 2010    Past Surgical History:  Procedure Laterality Date  . CHOLECYSTECTOMY    . CORONARY STENT INTERVENTION N/A 09/18/2016   Procedure: Coronary Stent Intervention;  Surgeon: Troy Sine, MD;  Location: [facility];  Service: Cardiovascular;  Laterality: N/A;  . EYE SURGERY    . KNEE ARTHROSCOPY    . LEFT HEART CATH N/A 09/18/2016   Procedure: Left Heart Cath;  Surgeon: Troy Sine, MD;  Location: [facility];  Service: Cardiovascular;  Laterality: N/A;  . LEFT HEART CATH AND CORONARY ANGIOGRAPHY N/A 09/16/2016   Procedure: Left Heart Cath and Coronary Angiography;  Surgeon: Burnell Blanks, MD;  Location: [facility];  Service: Cardiovascular;  Laterality: N/A;  . TUBAL LIGATION    . TUBAL LIGATION      Current Medications: Outpatient Medications Prior to Visit  Medication Sig Dispense Refill  . acetaminophen-codeine (TYLENOL #3) 300-30 MG tablet Take 1 tablet by mouth every 8 (eight) hours as needed for moderate pain.    [name] albuterol (PROVENTIL) (2.5 MG/3ML) 0.083% nebulizer solution Take 3 mLs (2.5 mg total) by nebulization every 6 (six) hours as needed for wheezing or shortness of breath. 150 mL 1  . albuterol (VENTOLIN HFA) 108 (90 Base) MCG/ACT inhaler Inhale 2  puffs into the lungs every 4 (four) hours as needed for wheezing or shortness of breath. 54 g 3  . aspirin EC 81 MG EC tablet Take 1 tablet (81 mg total) by mouth daily.    [name] atorvastatin (LIPITOR) 80 MG tablet Take 1 tablet (80 mg total) by mouth daily at 6 PM. 30 tablet 11  . Blood Glucose Monitoring Suppl (TRUE METRIX METER) DEVI 1 each by Does not apply route 3 (three) times daily before meals. 1 Device 0  . BYETTA 5 MCG PEN 5 MCG/0.02ML SOPN injection INJECT 5 MCG INTO THE SKIN 2 TIMES DAILY WITH A MEAL. 1 pen 3  . carvedilol (COREG) 12.5 MG tablet Take 2 tablets (25 mg total) by mouth 2 (two) times daily with a meal. 60 tablet 1  . diclofenac sodium (VOLTAREN) 1 % GEL Apply 4 g  topically daily as needed (for pain). 100 g 2  . exenatide (BYETTA 5 MCG PEN) 5 MCG/0.02ML SOPN injection INJECT 5 MCG INTO THE SKIN 2 TIMES DAILY WITH A MEAL. 3 pen 3  . ferrous sulfate 325 (65 FE) MG tablet Take 1 tablet (325 mg total) by mouth 2 (two) times daily with a meal. 60 tablet 3  . fluticasone (FLONASE) 50 MCG/ACT nasal spray Place 2 sprays into both nostrils daily as needed for allergies or rhinitis.    . Fluticasone-Salmeterol (ADVAIR DISKUS) 250-50 MCG/DOSE AEPB Inhale 1 puff into the lungs 2 (two) times daily. 60 each 3  . glucose blood (TRUE METRIX BLOOD GLUCOSE TEST) test strip Use 3 times before meals 100 each 12  . hydrALAZINE (APRESOLINE) 50 MG tablet Take 1 tablet (50 mg total) by mouth 3 (three) times daily. 90 tablet 1  . hydrOXYzine (ATARAX/VISTARIL) 50 MG tablet Take 1 tablet (50 mg total) by mouth 3 (three) times daily as needed. 90 tablet 1  . insulin aspart (NOVOLOG) 100 UNIT/ML FlexPen Inject 20 Units into the skin 3 (three) times daily with meals. 30 mL 3  . Insulin Glargine (LANTUS SOLOSTAR) 100 UNIT/ML Solostar Pen Inject 60 Units into the skin every morning. 15 mL 5  . ketotifen (ZADITOR) 0.025 % ophthalmic solution Place 1 drop into both eyes daily as needed (for dry eyes). 10 mL 2  . meclizine (ANTIVERT) 25 MG tablet Take 1 tablet (25 mg total) by mouth 3 (three) times daily as needed. 60 tablet 2  . ranitidine (ZANTAC) 150 MG tablet Take 1 tablet (150 mg total) by mouth 2 (two) times daily. 60 tablet 3  . sitaGLIPtin (JANUVIA) 50 MG tablet Take 1 tablet (50 mg total) by mouth daily. 90 tablet 3  . ticagrelor (BRILINTA) 90 MG TABS tablet Take 1 tablet (90 mg total) by mouth 2 (two) times daily. 180 tablet 3  . traMADol (ULTRAM) 50 MG tablet Take 1 tablet (50 mg total) by mouth every 6 (six) hours as needed for moderate pain. 10 tablet 0  . TRUEPLUS LANCETS 28G MISC 1 each by Does not apply route 3 (three) times daily before meals. 100 each 12  . valACYclovir  (VALTREX) 500 MG tablet Take 1 tablet (500 mg total) by mouth daily. 30 tablet 0  . furosemide (LASIX) 40 MG tablet Take 1 tablet (40 mg total) by mouth daily. Take an extra 40 mg at 2 PM if weight over 3 lbs or increased swelling 60 tablet 3  . gabapentin (NEURONTIN) 100 MG capsule Take 2 capsules (200 mg total) by mouth 3 (three) times daily. (Patient not taking:  Reported on 10/07/2016) 180 capsule 2  . valACYclovir (VALTREX) 500 MG tablet TAKE 1 TABLET BY MOUTH DAILY. 30 tablet 0   No facility-administered medications prior to visit.      Allergies:   Shellfish allergy; Diazepam; and Morphine and related   Social History   Social History  . Marital status: Divorced    Spouse name: N/A  . Number of children: N/A  . Years of education: N/A   Social History Main Topics  . Smoking status: Former Smoker    Quit date: 10/25/1980  . Smokeless tobacco: Never Used     Comment: quit smoking in 1982  . Alcohol use Yes     Comment: 3 times in last year  . Drug use: No     Comment: 08/21/2016 "clean since 05/1998"  . Sexual activity: Not Asked     Comment: Not asked   Other Topics Concern  . None   Social History Narrative  . None     Family History:  The patient's family history includes Colon cancer in her mother; Diabetes in her paternal grandmother; Heart attack in her maternal grandmother and other; Hypertension in her brother and sister.      ROS:   Please see the history of present illness.    ROS All other systems reviewed and are negative.   PHYSICAL EXAM:   VS:  BP (!) 142/54   Pulse 84   Ht 5\' 8"  (1.727 m)   Wt 295 lb 1.9 oz (133.9 kg)   SpO2 96%   BMI 44.87 kg/m    GEN: Well nourished, well developed, in no acute distress, obese HEENT: normal  Neck: no JVD, carotid bruits, or masses Cardiac: RRR; no murmurs, rubs, or gallops, 1+ LE edema bilateral Respiratory:  clear to auscultation bilaterally, normal work of breathing GI: soft, nontender, nondistended, +  BS MS: no deformity or atrophy  Skin: warm and dry, no rash Neuro:  Alert and Oriented x 3, Strength and sensation are intact Psych: euthymic mood, full affect    Wt Readings from Last 3 Encounters:  11/10/16 295 lb 1.9 oz (133.9 kg)  10/26/16 293 lb 9.6 oz (133.2 kg)  10/07/16 297 lb (134.7 kg)      Studies/Labs Reviewed:   EKG:  EKG is NOT ordered today.    Recent Labs: 08/22/2016: Magnesium 1.7 09/13/2016: TSH 1.860 09/19/2016: Hemoglobin 8.9; Platelets 227 09/23/2016: Brain Natriuretic Peptide 137.2 10/07/2016: ALT 32 10/26/2016: BUN 67; Creatinine, Ser 2.24; Potassium 5.0; Sodium 133   Lipid Panel    Component Value Date/Time   CHOL 122 09/13/2016 1245   TRIG 168 (H) 09/13/2016 1245   HDL 36 (L) 09/13/2016 1245   CHOLHDL 3.4 09/13/2016 1245   VLDL 34 09/13/2016 1245   LDLCALC 52 09/13/2016 1245    Additional studies/ records that were reviewed today include:  2D Echo 09/14/2016: Study Conclusions - Left ventricle: Septal and apical hypokinesis The cavity size was normal. Systolic function was mildly reduced. The estimated ejection fraction was in the range of 45% to 50%. Wall motion was normal; there were no regional wall motion abnormalities. Doppler parameters are consistent with both elevated ventricular end-diastolic filling pressure and elevated left atrial filling pressure. - Aortic valve: There was trivial regurgitation. Valve area (VTI): 1.38 cm^2. Valve area (Vmax): 1.24 cm^2. Valve area (Vmean): 1.27 cm^2. - Left atrium: The atrium was mildly dilated. - Atrial septum: No defect or patent foramen ovale was identified. - Pulmonary arteries: PA peak pressure: 38  mm Hg (S).  Cath 09-16-16: Conclusion   Ost 1st Mrg to 1st Mrg lesion, 50 %stenosed.  Prox LAD to Mid LAD lesion, 95 %stenosed.  Mid LAD lesion, 40 %stenosed.  Dist LAD lesion, 10 %stenosed.  Ost 2nd Mrg to 2nd Mrg lesion, 50 %stenosed.  Mid Cx lesion, 20  %stenosed.  1. Severe stenosis mid LAD 2. Moderate stenosis ostium of both OM1 and OM2.  3. Elevated filling pressures.   Recommendations: She will need staged PCI of the severe stenosis in the mid LAD given her renal insufficiency. Will hydrate gently next 24 hours. Will given one time dose of IV Lasix tonight with elevated filling pressures. Will load with Brilinta 180 mg po x 1 tonight then start Brilinta 90 mg po BID in the am. Continue ASA, statin and beta blocker. Resume IV heparin 6 hours post sheath pull. I will place her on the cath schedule for Friday 09/18/16.      PCI 09-18-2016: Conclusion   Mid LAD lesion, 40 %stenosed.  Dist LAD lesion, 50 %stenosed.  A STENT SYNERGY DES 3X32 drug eluting stent was successfully placed.  Prox LAD lesion, 95 %stenosed.  Post intervention, there is a 0% residual stenosis.  Successful PCI of an eccentric 95%, and tandem 40% and 50-60%stenosis with Angiosculpt scoring balloon, and ultimate DES stenting with a 3.032 mm Synergy DES stent postdilated to 3.26 mm with the entire region of stenosis being reduced to 0%.  LV pressure recording: 120/24  RECOMMENDATION: Ms Monsour will be gently hydrated post procedure with her baseline renal insufficiency. LVEDP today was improved but elevated at 24, compared to 35 mmHg at diagnostic catheterization. DAPT for a minimum of 1 year and medical therapy for concomitant CAD.      ASSESSMENT & PLAN:   CAD: recent NSTEMI. Continue DAPT with ASA/Brilinta, BB and statin. She has what sounds like chronic stable angina that resolves quickly with rest. I have cleared her to go back to the gym. She will let us know if chest pain worsens in frequency or severity  Chronic combined S/D CHF: appears slightly volume up today with some mild LE edema and weight has increased from 286--> 293 lbs. She has been taking Lasix 40mg  BID instead of daily. I will check a BMET and BNP today and adjust diuretics  accordingly. -- Continue Coreg 12.5mg  BID. Plan was to add ACE/ARB if creat normalized. Her creat actually worsened two weeks ago. She is currently on hydralazine 50mg  TID. I will add imdur 30mg  daily as well in case that her kidney function prohibits use of ACE/ARB  HTN: BP mildly elevated today. Will add imdur 30mg  daily as above  CKD stage III: most recent BMET showed creat up to 2.24. Will check BMET today.  DMT2: HgA1c 8.0 last month. Continue current regimen     Medication Adjustments/Labs and Tests Ordered: Current medicines are reviewed at length with the patient today.  Concerns regarding medicines are outlined above.  Medication changes, Labs and Tests ordered today are listed in the Patient Instructions below. Patient Instructions  Medication Instructions:  Your physician has recommended you make the following change in your medication:  1.  START Imdur 30 mg taking 1 tablet daily   Labwork: TODAY:  PRO BNP & BMET  Testing/Procedures: None ordered  Follow-Up: Your physician recommends that you schedule a follow-up appointment in: 3 MONTHS WITH DR. Burt Knack    Any Other Special Instructions Will Be Listed Below (If Applicable).    If you  need a refill on your cardiac medications before your next appointment, please call your pharmacy.      Signed, Angelena Form, PA-C  11/10/2016 2:17 PM    [facility] [address], [place], [place]  [address] Phone: [phone]; Fax: [phone]

## 2016-11-10 ENCOUNTER — Ambulatory Visit (INDEPENDENT_AMBULATORY_CARE_PROVIDER_SITE_OTHER): Payer: Self-pay | Admitting: Physician Assistant

## 2016-11-10 ENCOUNTER — Telehealth: Payer: Self-pay | Admitting: Family Medicine

## 2016-11-10 ENCOUNTER — Encounter: Payer: Self-pay | Admitting: Physician Assistant

## 2016-11-10 VITALS — BP 142/54 | HR 84 | Ht 68.0 in | Wt 295.1 lb

## 2016-11-10 DIAGNOSIS — I5022 Chronic systolic (congestive) heart failure: Secondary | ICD-10-CM

## 2016-11-10 DIAGNOSIS — E118 Type 2 diabetes mellitus with unspecified complications: Secondary | ICD-10-CM

## 2016-11-10 DIAGNOSIS — I2510 Atherosclerotic heart disease of native coronary artery without angina pectoris: Secondary | ICD-10-CM

## 2016-11-10 DIAGNOSIS — N189 Chronic kidney disease, unspecified: Secondary | ICD-10-CM

## 2016-11-10 MED ORDER — ISOSORBIDE MONONITRATE ER 30 MG PO TB24: 30.0000 mg | ORAL_TABLET | Freq: Every day | ORAL | 3 refills | Status: DC

## 2016-11-10 NOTE — Telephone Encounter (Signed)
Paperwork received. Pt will be called once paperwork is ready for pick up.

## 2016-11-10 NOTE — Telephone Encounter (Signed)
Pt. Came to facility to drop off Disability paperwork to be filled out by PCP. Pt. Was told that paperwork would take 7-14 business days to be filled out and that the nurse would call her once paperwork has been filled out. Paperwork will be put in PCP box. Please f/u

## 2016-11-10 NOTE — Patient Instructions (Addendum)
Medication Instructions:  Your physician has recommended you make the following change in your medication:  1.  START Imdur 30 mg taking 1 tablet daily   Labwork: TODAY:  PRO BNP & BMET  Testing/Procedures: None ordered  Follow-Up: Your physician recommends that you schedule a follow-up appointment in: 3 MONTHS WITH DR. Burt Knack    Any Other Special Instructions Will Be Listed Below (If Applicable).    If you need a refill on your cardiac medications before your next appointment, please call your pharmacy.

## 2016-11-11 LAB — BASIC METABOLIC PANEL
BUN/Creatinine Ratio: 31 — ABNORMAL HIGH (ref 9–23)
BUN: 68 mg/dL — ABNORMAL HIGH (ref 6–24)
CO2: 24 mmol/L (ref 18–29)
Calcium: 8.8 mg/dL (ref 8.7–10.2)
Chloride: 96 mmol/L (ref 96–106)
Creatinine, Ser: 2.19 mg/dL — ABNORMAL HIGH (ref 0.57–1.00)
GFR calc Af Amer: 28 mL/min/{1.73_m2} — ABNORMAL LOW (ref >59)
GFR calc non Af Amer: 24 mL/min/{1.73_m2} — ABNORMAL LOW (ref >59)
Glucose: 443 mg/dL — ABNORMAL HIGH (ref 65–99)
Potassium: 4.1 mmol/L (ref 3.5–5.2)
Sodium: 137 mmol/L (ref 134–144)

## 2016-11-11 LAB — PRO B NATRIURETIC PEPTIDE: NT-Pro BNP: 202 pg/mL (ref 0–287)

## 2016-11-11 MED FILL — ISOSORBIDE MN ER 30 MG TAB: 30 | 30 days supply | Qty: 30 | Fill #0

## 2016-11-11 MED FILL — hydrALAZINE HCL 50 MG TABS: 50 | 30 days supply | Qty: 90 | Fill #1

## 2016-11-13 ENCOUNTER — Other Ambulatory Visit: Payer: Self-pay | Admitting: Family Medicine

## 2016-11-13 DIAGNOSIS — I5033 Acute on chronic diastolic (congestive) heart failure: Secondary | ICD-10-CM

## 2016-11-13 MED FILL — ?CARVEDILOL 25 MG TABLET: 25 | 30 days supply | Qty: 60 | Fill #0

## 2016-11-13 MED FILL — AMLODIPINE BESYLATE 5 MG TA: 5 | 30 days supply | Qty: 30 | Fill #2

## 2016-11-13 MED FILL — raNITIdine HCL 150 MG TABS: 150 | 30 days supply | Qty: 60 | Fill #3

## 2016-11-13 MED FILL — hydrOXYzine HCL 50 MG TABS: 50 | 30 days supply | Qty: 90 | Fill #0

## 2016-11-13 MED FILL — FERROUS SULFATE 325 MG TAB: 325 (65 FE) | 30 days supply | Qty: 60 | Fill #3

## 2016-11-17 ENCOUNTER — Ambulatory Visit: Payer: Medicaid Other | Attending: Family Medicine | Admitting: Family Medicine

## 2016-11-17 ENCOUNTER — Encounter: Payer: Self-pay | Admitting: Family Medicine

## 2016-11-17 VITALS — BP 144/75 | HR 89 | Temp 98.1°F | Ht 68.0 in | Wt 301.2 lb

## 2016-11-17 DIAGNOSIS — Z6841 Body Mass Index (BMI) 40.0 and over, adult: Secondary | ICD-10-CM | POA: Diagnosis not present

## 2016-11-17 DIAGNOSIS — Z124 Encounter for screening for malignant neoplasm of cervix: Secondary | ICD-10-CM

## 2016-11-17 DIAGNOSIS — E1122 Type 2 diabetes mellitus with diabetic chronic kidney disease: Secondary | ICD-10-CM | POA: Insufficient documentation

## 2016-11-17 DIAGNOSIS — Z794 Long term (current) use of insulin: Secondary | ICD-10-CM | POA: Insufficient documentation

## 2016-11-17 DIAGNOSIS — I130 Hypertensive heart and chronic kidney disease with heart failure and stage 1 through stage 4 chronic kidney disease, or unspecified chronic kidney disease: Secondary | ICD-10-CM | POA: Insufficient documentation

## 2016-11-17 DIAGNOSIS — Z0000 Encounter for general adult medical examination without abnormal findings: Secondary | ICD-10-CM

## 2016-11-17 DIAGNOSIS — B182 Chronic viral hepatitis C: Secondary | ICD-10-CM | POA: Diagnosis not present

## 2016-11-17 DIAGNOSIS — IMO0002 Reserved for concepts with insufficient information to code with codable children: Secondary | ICD-10-CM

## 2016-11-17 DIAGNOSIS — I252 Old myocardial infarction: Secondary | ICD-10-CM | POA: Insufficient documentation

## 2016-11-17 DIAGNOSIS — Z79899 Other long term (current) drug therapy: Secondary | ICD-10-CM | POA: Insufficient documentation

## 2016-11-17 DIAGNOSIS — Z7982 Long term (current) use of aspirin: Secondary | ICD-10-CM | POA: Diagnosis not present

## 2016-11-17 DIAGNOSIS — I5040 Unspecified combined systolic (congestive) and diastolic (congestive) heart failure: Secondary | ICD-10-CM | POA: Diagnosis not present

## 2016-11-17 DIAGNOSIS — E669 Obesity, unspecified: Secondary | ICD-10-CM | POA: Insufficient documentation

## 2016-11-17 DIAGNOSIS — N183 Chronic kidney disease, stage 3 (moderate): Secondary | ICD-10-CM | POA: Diagnosis not present

## 2016-11-17 DIAGNOSIS — I10 Essential (primary) hypertension: Secondary | ICD-10-CM

## 2016-11-17 DIAGNOSIS — Z87891 Personal history of nicotine dependence: Secondary | ICD-10-CM | POA: Insufficient documentation

## 2016-11-17 DIAGNOSIS — E118 Type 2 diabetes mellitus with unspecified complications: Secondary | ICD-10-CM

## 2016-11-17 DIAGNOSIS — I2510 Atherosclerotic heart disease of native coronary artery without angina pectoris: Secondary | ICD-10-CM | POA: Insufficient documentation

## 2016-11-17 DIAGNOSIS — R109 Unspecified abdominal pain: Secondary | ICD-10-CM | POA: Diagnosis not present

## 2016-11-17 DIAGNOSIS — E1165 Type 2 diabetes mellitus with hyperglycemia: Secondary | ICD-10-CM | POA: Insufficient documentation

## 2016-11-17 LAB — POCT URINALYSIS DIPSTICK
Bilirubin, UA: NEGATIVE
Blood, UA: NEGATIVE
Glucose, UA: NEGATIVE
Ketones, UA: NEGATIVE
Leukocytes, UA: NEGATIVE
Nitrite, UA: NEGATIVE
Protein, UA: >=300
Spec Grav, UA: 1.015 (ref 1.010–1.025)
Urobilinogen, UA: 0.2 E.U./dL
pH, UA: 6 (ref 5.0–8.0)

## 2016-11-17 LAB — GLUCOSE, POCT (MANUAL RESULT ENTRY): POC Glucose: 239 mg/dl — AB (ref 70–99)

## 2016-11-17 MED ORDER — CYCLOBENZAPRINE HCL 10 MG PO TABS: 10.0000 mg | ORAL_TABLET | Freq: Three times a day (TID) | ORAL | 0 refills | Status: DC | PRN

## 2016-11-17 MED ORDER — SITAGLIPTIN PHOSPHATE 25 MG PO TABS: 25.0000 mg | ORAL_TABLET | Freq: Every day | ORAL | 3 refills | Status: DC

## 2016-11-17 MED ORDER — INSULIN ASPART 100 UNIT/ML FLEXPEN: 25.0000 [IU] | PEN_INJECTOR | Freq: Three times a day (TID) | SUBCUTANEOUS | 3 refills | Status: DC

## 2016-11-17 NOTE — Progress Notes (Signed)
Subjective:  Patient ID: [name], female    DOB: 05-21-1956  Age: 61 y.o. MRN: [id number]  CC: Diabetes   HPI [name] has diabetes, HTN, CAD, recent NSTEMI s/p stent placement, Hep C,  CKD stage 3 mixed systolic and diastolic CHF, obesity she presents for    1. Mixed systolic and diastolic CHF: taking lasix 40 mg in morning. Reports she is having some shortness of breath. Reports leg swelling has improved . Denies chest pain. She is feeling fatigue.   2. Diabetes:  CBGs 209-410 Last took novolog yesterday afternoon. Reports she ran out of novolog.  Last took lantus 2 nights ago.  Taking byetta 5 mcg  BID reports appetite is still high.  Taking januvia 50 mg daily   3. R sided flank pain: x 2 days. Non radiating. Mid to low back and flank. No fever, chills. Some nausea. No emesis. No dysuria. No trauma. No ETOH.   Social History  Substance Use Topics  . Smoking status: Former Smoker    Quit date: 10/25/1980  . Smokeless tobacco: Never Used     Comment: quit smoking in 1982  . Alcohol use Yes     Comment: 3 times in last year    Outpatient Medications Prior to Visit  Medication Sig Dispense Refill  . acetaminophen-codeine (TYLENOL #3) 300-30 MG tablet Take 1 tablet by mouth every 8 (eight) hours as needed for moderate pain.    [name] albuterol (PROVENTIL) (2.5 MG/3ML) 0.083% nebulizer solution Take 3 mLs (2.5 mg total) by nebulization every 6 (six) hours as needed for wheezing or shortness of breath. 150 mL 1  . albuterol (VENTOLIN HFA) 108 (90 Base) MCG/ACT inhaler Inhale 2 puffs into the lungs every 4 (four) hours as needed for wheezing or shortness of breath. 54 g 3  . aspirin EC 81 MG EC tablet Take 1 tablet (81 mg total) by mouth daily.    [name] atorvastatin (LIPITOR) 80 MG tablet Take 1 tablet (80 mg total) by mouth daily at 6 PM. 30 tablet 11  . Blood Glucose Monitoring Suppl (TRUE METRIX METER) DEVI 1 each by Does not apply route 3 (three) times daily before meals. 1  Device 0  . BYETTA 5 MCG PEN 5 MCG/0.02ML SOPN injection INJECT 5 MCG INTO THE SKIN 2 TIMES DAILY WITH A MEAL. 1 pen 3  . carvedilol (COREG) 25 MG tablet TAKE 1 TABLET BY MOUTH 2 TIMES DAILY WITH A MEAL. 60 tablet 2  . diclofenac sodium (VOLTAREN) 1 % GEL Apply 4 g topically daily as needed (for pain). 100 g 2  . exenatide (BYETTA 5 MCG PEN) 5 MCG/0.02ML SOPN injection INJECT 5 MCG INTO THE SKIN 2 TIMES DAILY WITH A MEAL. 3 pen 3  . ferrous sulfate 325 (65 FE) MG tablet Take 1 tablet (325 mg total) by mouth 2 (two) times daily with a meal. 60 tablet 3  . fluticasone (FLONASE) 50 MCG/ACT nasal spray Place 2 sprays into both nostrils daily as needed for allergies or rhinitis.    . Fluticasone-Salmeterol (ADVAIR DISKUS) 250-50 MCG/DOSE AEPB Inhale 1 puff into the lungs 2 (two) times daily. 60 each 3  . furosemide (LASIX) 40 MG tablet Take 40 mg by mouth 2 (two) times daily.    [name] glucose blood (TRUE METRIX BLOOD GLUCOSE TEST) test strip Use 3 times before meals 100 each 12  . hydrALAZINE (APRESOLINE) 50 MG tablet Take 1 tablet (50 mg total) by mouth 3 (three) times daily.  90 tablet 1  . hydrOXYzine (ATARAX/VISTARIL) 50 MG tablet Take 1 tablet (50 mg total) by mouth 3 (three) times daily as needed. 90 tablet 1  . insulin aspart (NOVOLOG) 100 UNIT/ML FlexPen Inject 20 Units into the skin 3 (three) times daily with meals. 30 mL 3  . Insulin Glargine (LANTUS SOLOSTAR) 100 UNIT/ML Solostar Pen Inject 60 Units into the skin every morning. 15 mL 5  . isosorbide mononitrate (IMDUR) 30 MG 24 hr tablet Take 1 tablet (30 mg total) by mouth daily. 30 tablet 3  . ketotifen (ZADITOR) 0.025 % ophthalmic solution Place 1 drop into both eyes daily as needed (for dry eyes). 10 mL 2  . meclizine (ANTIVERT) 25 MG tablet Take 1 tablet (25 mg total) by mouth 3 (three) times daily as needed. 60 tablet 2  . pregabalin (LYRICA) 100 MG capsule Take 100 mg by mouth 3 (three) times daily.    . ranitidine (ZANTAC) 150 MG tablet  Take 1 tablet (150 mg total) by mouth 2 (two) times daily. 60 tablet 3  . sitaGLIPtin (JANUVIA) 50 MG tablet Take 1 tablet (50 mg total) by mouth daily. 90 tablet 3  . ticagrelor (BRILINTA) 90 MG TABS tablet Take 1 tablet (90 mg total) by mouth 2 (two) times daily. 180 tablet 3  . traMADol (ULTRAM) 50 MG tablet Take 1 tablet (50 mg total) by mouth every 6 (six) hours as needed for moderate pain. 10 tablet 0  . TRUEPLUS LANCETS 28G MISC 1 each by Does not apply route 3 (three) times daily before meals. 100 each 12  . valACYclovir (VALTREX) 500 MG tablet Take 1 tablet (500 mg total) by mouth daily. 30 tablet 0   No facility-administered medications prior to visit.     ROS Review of Systems  Constitutional: Positive for fatigue. Negative for chills and fever.  Eyes: Negative for visual disturbance.  Respiratory: Positive for shortness of breath.   Cardiovascular: Positive for leg swelling. Negative for chest pain.  Gastrointestinal: Positive for abdominal pain and nausea. Negative for blood in stool.  Genitourinary: Positive for flank pain. Negative for dysuria.  Musculoskeletal: Negative for arthralgias and back pain.  Skin: Negative for rash.  Allergic/Immunologic: Negative for immunocompromised state.  Neurological:       Forgetfulness  Hematological: Negative for adenopathy. Does not bruise/bleed easily.  Psychiatric/Behavioral: Negative for dysphoric mood and suicidal ideas.    Objective:  BP (!) 144/75   Pulse 89   Temp 98.1 F (36.7 C) (Oral)   Ht 5\' 8"  (1.727 m)   Wt (!) 301 lb 3.2 oz (136.6 kg)   SpO2 97%   BMI 45.80 kg/m   BP/Weight 11/17/2016 11/10/2016 7/85/8850  Systolic BP 277 412 878  Diastolic BP 75 54 80  Wt. (Lbs) 301.2 295.12 293.6  BMI 45.8 44.87 44.64   Wt Readings from Last 3 Encounters:  11/17/16 (!) 301 lb 3.2 oz (136.6 kg)  11/10/16 295 lb 1.9 oz (133.9 kg)  10/26/16 293 lb 9.6 oz (133.2 kg)     Physical Exam  Constitutional: She is oriented to  person, place, and time. She appears well-developed and well-nourished. No distress.  Obese   HENT:  Head: Normocephalic and atraumatic.  Cardiovascular: Normal rate, regular rhythm, normal heart sounds and intact distal pulses.   Pulmonary/Chest: Effort normal and breath sounds normal.  Abdominal: Soft. Bowel sounds are normal. There is hepatomegaly. There is tenderness in the right upper quadrant. There is no CVA tenderness.  Musculoskeletal: She exhibits  edema (trace).  Neurological: She is alert and oriented to person, place, and time.  Skin: Skin is warm and dry. No rash noted.  Psychiatric: She has a normal mood and affect.   Lab Results  Component Value Date   HGBA1C 8.0 (H) 09/13/2016   CBG  UA: > 300 protein, otherwise negative    Chemistry      Component Value Date/Time   NA 143 11/17/2016 1632   K 4.1 11/17/2016 1632   CL 103 11/17/2016 1632   CO2 22 11/17/2016 1632   BUN 52 (H) 11/17/2016 1632   CREATININE 1.77 (H) 11/17/2016 1632   CREATININE 1.36 (H) 09/23/2016 1451      Component Value Date/Time   CALCIUM 8.4 (L) 11/17/2016 1632   ALKPHOS 185 (H) 11/17/2016 1632   AST 28 11/17/2016 1632   ALT 31 11/17/2016 1632   BILITOT 0.3 11/17/2016 1632      Assessment & Plan:  Adylynn was seen today for diabetes.  Diagnoses and all orders for this visit:  Uncontrolled type 2 diabetes mellitus with complication, with long-term current use of insulin (HCC) -     POCT glucose (manual entry) -     sitaGLIPtin (JANUVIA) 25 MG tablet; Take 1 tablet (25 mg total) by mouth daily. For PASS -     insulin aspart (NOVOLOG) 100 UNIT/ML FlexPen; Inject 25 Units into the skin 3 (three) times daily with meals. -     Ambulatory referral to Ophthalmology  Right flank pain -     POCT urinalysis dipstick -     CMP and Liver -     cyclobenzaprine (FLEXERIL) 10 MG tablet; Take 1 tablet (10 mg total) by mouth 3 (three) times daily as needed for muscle spasms.  Pap smear for cervical  cancer screening -     Ambulatory referral to Gynecology  Hep C w/o coma, chronic ([place]) -     Ambulatory referral to Infectious Disease  Healthcare maintenance -     Ambulatory referral to Gastroenterology  Essential hypertension   There are no diagnoses linked to this encounter.  No orders of the defined types were placed in this encounter.   Follow-up: Return in about 1 month (around 12/18/2016) for diabetes.   Boykin Nearing MD

## 2016-11-17 NOTE — Patient Instructions (Addendum)
[name] was seen today for diabetes.  Diagnoses and all orders for this visit:  Uncontrolled type 2 diabetes mellitus with complication, with long-term current use of insulin (HCC) -     POCT glucose (manual entry) -     sitaGLIPtin (JANUVIA) 25 MG tablet; Take 1 tablet (25 mg total) by mouth daily. For PASS -     insulin aspart (NOVOLOG) 100 UNIT/ML FlexPen; Inject 25 Units into the skin 3 (three) times daily with meals.  Right flank pain -     POCT urinalysis dipstick -     CMP and Liver -     cyclobenzaprine (FLEXERIL) 10 MG tablet; Take 1 tablet (10 mg total) by mouth 3 (three) times daily as needed for muscle spasms.  Pap smear for cervical cancer screening -     Ambulatory referral to Gynecology  Hep C w/o coma, chronic ([place]) -     Ambulatory referral to Infectious Disease  Stop byetta Decrease januvia to 25 mg daily Increase novolog to 25 U three times daily  Diabetes blood sugar goals  Fasting (in AM before breakfast, 8 hrs of no eating or drinking (except water or unsweetened coffee or tea): 90-110 2 hrs after meals: < 160,   No low sugars: nothing < 70   f/u in 1 month for diabetes/ A1c check  Dr. Adrian Blackwater

## 2016-11-18 LAB — CMP AND LIVER
ALT: 31 IU/L (ref 0–32)
AST: 28 IU/L (ref 0–40)
Albumin: 3.6 g/dL (ref 3.5–5.5)
Alkaline Phosphatase: 185 IU/L — ABNORMAL HIGH (ref 39–117)
BUN: 52 mg/dL — ABNORMAL HIGH (ref 6–24)
Bilirubin Total: 0.3 mg/dL (ref 0.0–1.2)
Bilirubin, Direct: 0.13 mg/dL (ref 0.00–0.40)
CO2: 22 mmol/L (ref 18–29)
Calcium: 8.4 mg/dL — ABNORMAL LOW (ref 8.7–10.2)
Chloride: 103 mmol/L (ref 96–106)
Creatinine, Ser: 1.77 mg/dL — ABNORMAL HIGH (ref 0.57–1.00)
GFR calc Af Amer: 36 mL/min/{1.73_m2} — ABNORMAL LOW (ref >59)
GFR calc non Af Amer: 31 mL/min/{1.73_m2} — ABNORMAL LOW (ref >59)
Glucose: 314 mg/dL — ABNORMAL HIGH (ref 65–99)
Potassium: 4.1 mmol/L (ref 3.5–5.2)
Sodium: 143 mmol/L (ref 134–144)
Total Protein: 7 g/dL (ref 6.0–8.5)

## 2016-11-18 MED FILL — CYCLOBENZAPRINE 10 MG TAB: 10 | 10 days supply | Qty: 30 | Fill #0

## 2016-11-19 ENCOUNTER — Telehealth: Payer: Self-pay | Admitting: Cardiovascular Disease

## 2016-11-19 DIAGNOSIS — R10A1 Flank pain, right side: Secondary | ICD-10-CM | POA: Insufficient documentation

## 2016-11-19 DIAGNOSIS — R109 Unspecified abdominal pain: Secondary | ICD-10-CM | POA: Insufficient documentation

## 2016-11-19 NOTE — Telephone Encounter (Signed)
New message     Results of her lab work

## 2016-11-19 NOTE — Telephone Encounter (Signed)
lmtcb

## 2016-11-19 NOTE — Assessment & Plan Note (Signed)
A: improved imdur 30 mg recently added P: Continue current regimen

## 2016-11-19 NOTE — Assessment & Plan Note (Signed)
A: acute pain. No fever, chills, emesis, no hematuria. No acute elevation in liver enzymes. Suspect MSK pain P: Muscle  Relaxer Close follow up

## 2016-11-19 NOTE — Telephone Encounter (Signed)
See result note.  

## 2016-11-19 NOTE — Assessment & Plan Note (Signed)
A: improved CBGs, declining GFR P: D/c byetta Decrease januvia to 25 mg daily increase novolog to 25 U TID  From 20 Continue lantus 60 U daily

## 2016-11-20 ENCOUNTER — Other Ambulatory Visit: Payer: Self-pay | Admitting: Family Medicine

## 2016-11-20 DIAGNOSIS — A6000 Herpesviral infection of urogenital system, unspecified: Secondary | ICD-10-CM

## 2016-11-20 MED FILL — ?FUROSEMIDE 40 MG TABLET: 40 | 30 days supply | Qty: 30 | Fill #1

## 2016-11-20 MED FILL — ?VALACYCLOVIR HCL 500MG TAB: 500 | 30 days supply | Qty: 30 | Fill #0

## 2016-11-25 ENCOUNTER — Encounter: Payer: Self-pay | Admitting: Family Medicine

## 2016-11-27 MED FILL — FUROSEMIDE 40 MG TABLET: 40 | 30 days supply | Qty: 30 | Fill #2

## 2016-11-30 ENCOUNTER — Telehealth: Payer: Self-pay

## 2016-11-30 DIAGNOSIS — N183 Chronic kidney disease, stage 3 (moderate): Secondary | ICD-10-CM | POA: Diagnosis not present

## 2016-11-30 DIAGNOSIS — I252 Old myocardial infarction: Secondary | ICD-10-CM | POA: Diagnosis not present

## 2016-11-30 DIAGNOSIS — Z794 Long term (current) use of insulin: Secondary | ICD-10-CM | POA: Diagnosis not present

## 2016-11-30 DIAGNOSIS — R0602 Shortness of breath: Secondary | ICD-10-CM | POA: Diagnosis present

## 2016-11-30 DIAGNOSIS — Z87891 Personal history of nicotine dependence: Secondary | ICD-10-CM | POA: Diagnosis not present

## 2016-11-30 DIAGNOSIS — Z79899 Other long term (current) drug therapy: Secondary | ICD-10-CM | POA: Insufficient documentation

## 2016-11-30 DIAGNOSIS — I2510 Atherosclerotic heart disease of native coronary artery without angina pectoris: Secondary | ICD-10-CM | POA: Insufficient documentation

## 2016-11-30 DIAGNOSIS — I509 Heart failure, unspecified: Secondary | ICD-10-CM | POA: Diagnosis not present

## 2016-11-30 DIAGNOSIS — Z7982 Long term (current) use of aspirin: Secondary | ICD-10-CM | POA: Diagnosis not present

## 2016-11-30 DIAGNOSIS — J45909 Unspecified asthma, uncomplicated: Secondary | ICD-10-CM | POA: Diagnosis not present

## 2016-11-30 DIAGNOSIS — E1122 Type 2 diabetes mellitus with diabetic chronic kidney disease: Secondary | ICD-10-CM | POA: Insufficient documentation

## 2016-11-30 DIAGNOSIS — I130 Hypertensive heart and chronic kidney disease with heart failure and stage 1 through stage 4 chronic kidney disease, or unspecified chronic kidney disease: Secondary | ICD-10-CM | POA: Insufficient documentation

## 2016-11-30 NOTE — Telephone Encounter (Signed)
Pt was called and informed of lab results. 

## 2016-12-01 ENCOUNTER — Emergency Department (HOSPITAL_COMMUNITY): Payer: Medicaid Other

## 2016-12-01 ENCOUNTER — Encounter (HOSPITAL_COMMUNITY): Payer: Self-pay

## 2016-12-01 ENCOUNTER — Ambulatory Visit: Payer: Self-pay | Admitting: Registered"

## 2016-12-01 ENCOUNTER — Emergency Department (HOSPITAL_COMMUNITY)
Admission: EM | Admit: 2016-12-01 | Discharge: 2016-12-01 | Disposition: A | Discharge status: Home / Self Care | Payer: Medicaid Other | Attending: Emergency Medicine | Admitting: Emergency Medicine

## 2016-12-01 DIAGNOSIS — I509 Heart failure, unspecified: Secondary | ICD-10-CM

## 2016-12-01 LAB — BASIC METABOLIC PANEL
Anion gap: 10 (ref 5–15)
BUN: 80 mg/dL — ABNORMAL HIGH (ref 6–20)
CO2: 21 mmol/L — ABNORMAL LOW (ref 22–32)
Calcium: 8.3 mg/dL — ABNORMAL LOW (ref 8.9–10.3)
Chloride: 104 mmol/L (ref 101–111)
Creatinine, Ser: 2.38 mg/dL — ABNORMAL HIGH (ref 0.44–1.00)
GFR calc Af Amer: 25 mL/min — ABNORMAL LOW (ref >60)
GFR calc non Af Amer: 21 mL/min — ABNORMAL LOW (ref >60)
Glucose, Bld: 302 mg/dL — ABNORMAL HIGH (ref 65–99)
Potassium: 3.9 mmol/L (ref 3.5–5.1)
Sodium: 135 mmol/L (ref 135–145)

## 2016-12-01 LAB — CBC
HCT: 30.7 % — ABNORMAL LOW (ref 36.0–46.0)
Hemoglobin: 9.9 g/dL — ABNORMAL LOW (ref 12.0–15.0)
MCH: 27.7 pg (ref 26.0–34.0)
MCHC: 32.2 g/dL (ref 30.0–36.0)
MCV: 85.8 fL (ref 78.0–100.0)
Platelets: 222 10*3/uL (ref 150–400)
RBC: 3.58 MIL/uL — ABNORMAL LOW (ref 3.87–5.11)
RDW: 14.5 % (ref 11.5–15.5)
WBC: 8.9 10*3/uL (ref 4.0–10.5)

## 2016-12-01 LAB — BRAIN NATRIURETIC PEPTIDE: B Natriuretic Peptide: 124.6 pg/mL — ABNORMAL HIGH (ref 0.0–100.0)

## 2016-12-01 LAB — TROPONIN I: Troponin I: <0.03 ng/mL (ref <0.03)

## 2016-12-01 MED ORDER — FUROSEMIDE 10 MG/ML IJ SOLN
80.0000 mg | Freq: Once | INTRAMUSCULAR | Status: AC
  Administered 2016-12-01: 80 mg via INTRAVENOUS
  Filled 2016-12-01: qty 8

## 2016-12-01 NOTE — ED Triage Notes (Signed)
Pt reports increased SOB over the weekend, worse today, SOB at rest, hx of CHF, pt took extra dose of lasix 40mg , swelling in bilateral extremities. Denies CP

## 2016-12-01 NOTE — ED Provider Notes (Signed)
[facility] DEPT Provider Note   CSN: [id number] Arrival date & time: 11/30/16  2351     History   Chief Complaint Chief Complaint  Patient presents with  . Shortness of Breath    HPI [name] is a 61 y.o. female.  Patient complains of progressive SOB with exertion over the last 2 days. No chest pain, pain with breathing or fever. She reports it is worse with exertion and with lying flat. She has a mild cough, productive in the morning only. No nausea, vomiting, diarrhea. She took an extra dose of her Lasix today (40 mg) and reports no increase in urination.    The history is provided by the patient. No language interpreter was used.  Shortness of Breath  Associated symptoms include cough and leg swelling. Pertinent negatives include no fever, no sore throat, no chest pain, no vomiting and no abdominal pain.    Past Medical History:  Diagnosis Date  . Asthma    As a child   . Bronchitis   . CAD (coronary artery disease)    a. 09/2016: 50% Ost 1st Mrg stenosis, 50% 2nd Mrg stenosis, 20% Mid-Cx, 95% Prox LAD, 40% mid-LAD, and 10% dist-LAD stenosis. Staged PCI with DES to Prox-LAD.   [name] CHF (congestive heart failure) ([place]) 2011  . Complication of anesthesia   . Diabetes mellitus Dx 1989  . Hepatitis C Dx 2013  . Hypertension Dx 1989  . Obesity   . Pancreatitis 2013  . Refusal of blood transfusions as patient is Jehovah's Witness   . Ulcer 2010    Patient Active Problem List   Diagnosis Date Noted  . Right flank pain 11/19/2016  . Coronary artery disease involving native coronary artery of native heart with unstable angina pectoris ([place])   . Elevated troponin   . Acute combined systolic and diastolic congestive heart failure ([place])   . Diabetes mellitus with complication ([place])   . Acute on chronic diastolic congestive heart failure, NYHA class 4 ([place]) 09/13/2016  . NSTEMI (non-ST elevated myocardial infarction) ([place]) 09/13/2016  . Hypertensive emergency  09/13/2016  . Acute myopericarditis   . Hypertensive urgency 09/04/2016  . Injury of left hand 02/24/2016  . Asthma 11/29/2015  . Trigger middle finger 11/25/2015  . Depression 09/05/2015  . GERD (gastroesophageal reflux disease) 03/25/2015  . Precordial pain   . Environmental allergies 03/14/2015  . Hep C w/o coma, chronic ([place]) 01/31/2015  . Diabetic neuropathy ([place]) 01/31/2015  . OSA (obstructive sleep apnea) 01/01/2015  . Poor dentition 11/13/2014  . Essential hypertension 10/08/2014  . Obesity 10/08/2014  . CKD (chronic kidney disease) stage 3, GFR 30-59 ml/min 10/08/2014  . Diabetes mellitus type 2, uncontrolled, with complications ([place]) 40/04/2724    Class: Chronic  . Chronic diastolic CHF (congestive heart failure), NYHA class 2 ([place]) 03/25/2012  . Anemia in stage 3 chronic kidney disease 03/25/2012    Past Surgical History:  Procedure Laterality Date  . CHOLECYSTECTOMY    . CORONARY STENT INTERVENTION N/A 09/18/2016   Procedure: Coronary Stent Intervention;  Surgeon: Troy Sine, MD;  Location: [facility];  Service: Cardiovascular;  Laterality: N/A;  . EYE SURGERY    . KNEE ARTHROSCOPY    . LEFT HEART CATH N/A 09/18/2016   Procedure: Left Heart Cath;  Surgeon: Troy Sine, MD;  Location: [facility];  Service: Cardiovascular;  Laterality: N/A;  . LEFT HEART CATH AND CORONARY ANGIOGRAPHY N/A 09/16/2016   Procedure: Left Heart Cath  and Coronary Angiography;  Surgeon: Burnell Blanks, MD;  Location: [facility];  Service: Cardiovascular;  Laterality: N/A;  . TUBAL LIGATION    . TUBAL LIGATION      OB History    No data available       Home Medications    Prior to Admission medications   Medication Sig Start Date End Date Taking? Authorizing Provider  acetaminophen-codeine (TYLENOL #3) 300-30 MG tablet Take 1 tablet by mouth every 8 (eight) hours as needed for moderate pain.    [provider]  albuterol (PROVENTIL) (2.5  MG/3ML) 0.083% nebulizer solution Take 3 mLs (2.5 mg total) by nebulization every 6 (six) hours as needed for wheezing or shortness of breath. 03/25/15   Arnoldo Morale, MD  albuterol (VENTOLIN HFA) 108 (90 Base) MCG/ACT inhaler Inhale 2 puffs into the lungs every 4 (four) hours as needed for wheezing or shortness of breath. 08/03/16   Boykin Nearing, MD  aspirin EC 81 MG EC tablet Take 1 tablet (81 mg total) by mouth daily. 09/19/16   Strader, Fransisco Hertz, PA-C  atorvastatin (LIPITOR) 80 MG tablet Take 1 tablet (80 mg total) by mouth daily at 6 PM. 09/19/16   Strader, [place] M, PA-C  Blood Glucose Monitoring Suppl (TRUE METRIX METER) DEVI 1 each by Does not apply route 3 (three) times daily before meals. 10/07/16   Arnoldo Morale, MD  carvedilol (COREG) 25 MG tablet TAKE 1 TABLET BY MOUTH 2 TIMES DAILY WITH A MEAL. 11/13/16   Funches, Adriana Mccallum, MD  cyclobenzaprine (FLEXERIL) 10 MG tablet Take 1 tablet (10 mg total) by mouth 3 (three) times daily as needed for muscle spasms. 11/17/16   Funches, Adriana Mccallum, MD  diclofenac sodium (VOLTAREN) 1 % GEL Apply 4 g topically daily as needed (for pain). 11/25/15   Funches, Adriana Mccallum, MD  ferrous sulfate 325 (65 FE) MG tablet Take 1 tablet (325 mg total) by mouth 2 (two) times daily with a meal. 07/28/16   Funches, Josalyn, MD  fluticasone (FLONASE) 50 MCG/ACT nasal spray Place 2 sprays into both nostrils daily as needed for allergies or rhinitis.    [provider]  Fluticasone-Salmeterol (ADVAIR DISKUS) 250-50 MCG/DOSE AEPB Inhale 1 puff into the lungs 2 (two) times daily. 09/23/16   Arnoldo Morale, MD  furosemide (LASIX) 40 MG tablet Take 40 mg by mouth 2 (two) times daily.    [provider]  glucose blood (TRUE METRIX BLOOD GLUCOSE TEST) test strip Use 3 times before meals 10/07/16   Arnoldo Morale, MD  hydrALAZINE (APRESOLINE) 50 MG tablet Take 1 tablet (50 mg total) by mouth 3 (three) times daily. 10/07/16   Arnoldo Morale, MD  hydrOXYzine (ATARAX/VISTARIL) 50  MG tablet Take 1 tablet (50 mg total) by mouth 3 (three) times daily as needed. 09/23/16   Arnoldo Morale, MD  insulin aspart (NOVOLOG) 100 UNIT/ML FlexPen Inject 25 Units into the skin 3 (three) times daily with meals. 11/17/16   Funches, Adriana Mccallum, MD  Insulin Glargine (LANTUS SOLOSTAR) 100 UNIT/ML Solostar Pen Inject 60 Units into the skin every morning. 10/27/16   Funches, Adriana Mccallum, MD  isosorbide mononitrate (IMDUR) 30 MG 24 hr tablet Take 1 tablet (30 mg total) by mouth daily. 11/10/16   Eileen Stanford, PA-C  ketotifen (ZADITOR) 0.025 % ophthalmic solution Place 1 drop into both eyes daily as needed (for dry eyes). 07/27/16   Funches, Adriana Mccallum, MD  meclizine (ANTIVERT) 25 MG tablet Take 1 tablet (25 mg total) by mouth 3 (three)  times daily as needed. 10/07/16   Arnoldo Morale, MD  pregabalin (LYRICA) 100 MG capsule Take 100 mg by mouth 3 (three) times daily.    [provider]  ranitidine (ZANTAC) 150 MG tablet Take 1 tablet (150 mg total) by mouth 2 (two) times daily. 07/27/16   Funches, Adriana Mccallum, MD  sitaGLIPtin (JANUVIA) 25 MG tablet Take 1 tablet (25 mg total) by mouth daily. For PASS 11/17/16   Boykin Nearing, MD  ticagrelor (BRILINTA) 90 MG TABS tablet Take 1 tablet (90 mg total) by mouth 2 (two) times daily. 11/06/16   Funches, Adriana Mccallum, MD  traMADol (ULTRAM) 50 MG tablet Take 1 tablet (50 mg total) by mouth every 6 (six) hours as needed for moderate pain. 09/19/16   Strader, Fransisco Hertz, PA-C  TRUEPLUS LANCETS 28G MISC 1 each by Does not apply route 3 (three) times daily before meals. 10/07/16   Arnoldo Morale, MD  valACYclovir (VALTREX) 500 MG tablet Take 1 tablet (500 mg total) by mouth daily. 09/23/16   Arnoldo Morale, MD  valACYclovir (VALTREX) 500 MG tablet TAKE 1 TABLET BY MOUTH DAILY. 11/20/16   Arnoldo Morale, MD    Family History Family History  Problem Relation Age of Onset  . Colon cancer Mother   . Heart attack Other   . Heart attack Maternal Grandmother   . Hypertension Sister     . Hypertension Brother   . Diabetes Paternal Grandmother     Social History Social History  Substance Use Topics  . Smoking status: Former Smoker    Quit date: 10/25/1980  . Smokeless tobacco: Never Used     Comment: quit smoking in 1982  . Alcohol use Yes     Comment: 3 times in last year     Allergies   Shellfish allergy; Diazepam; and Morphine and related   Review of Systems Review of Systems  Constitutional: Negative for chills and fever.  HENT: Negative.  Negative for congestion and sore throat.   Respiratory: Positive for cough and shortness of breath.   Cardiovascular: Positive for leg swelling. Negative for chest pain.  Gastrointestinal: Negative.  Negative for abdominal pain, nausea and vomiting.  Musculoskeletal: Negative.   Skin: Negative.   Neurological: Negative.  Negative for syncope and weakness.     Physical Exam Updated Vital Signs BP (!) 156/70   Pulse 92   Temp 97.9 F (36.6 C)   Resp (!) 24   SpO2 94%   Physical Exam  Constitutional: She appears well-developed and well-nourished.  HENT:  Head: Normocephalic.  Neck: Normal range of motion. Neck supple.  Cardiovascular: Normal rate and regular rhythm.   No murmur heard. Pulmonary/Chest: Effort normal and breath sounds normal. She has no wheezes. She has no rales. She exhibits no tenderness.  Abdominal: Soft. Bowel sounds are normal. There is no tenderness. There is no rebound and no guarding.  Musculoskeletal: Normal range of motion. She exhibits edema (2+ edema bilateral LE's).  Neurological: She is alert.  Skin: Skin is warm and dry. No rash noted.  Psychiatric: She has a normal mood and affect.     ED Treatments / Results  Labs (all labs ordered are listed, but only abnormal results are displayed) Labs Reviewed  CBC - Abnormal; Notable for the following:       Result Value   RBC 3.58 (*)    Hemoglobin 9.9 (*)    HCT 30.7 (*)    All other components within normal limits  BASIC  METABOLIC PANEL -  Abnormal; Notable for the following:    CO2 21 (*)    Glucose, Bld 302 (*)    BUN 80 (*)    Creatinine, Ser 2.38 (*)    Calcium 8.3 (*)    GFR calc non Af Amer 21 (*)    GFR calc Af Amer 25 (*)    All other components within normal limits  BRAIN NATRIURETIC PEPTIDE - Abnormal; Notable for the following:    B Natriuretic Peptide 124.6 (*)    All other components within normal limits  TROPONIN I    EKG  EKG Interpretation  Date/Time:  Tuesday Dec 01 2016 00:07:43 EDT Ventricular Rate:  86 PR Interval:  142 QRS Duration: 90 QT Interval:  434 QTC Calculation: 519 R Axis:   34 Text Interpretation:   Poor data quality, interpretation may be adversely affected Normal sinus rhythm Nonspecific T wave abnormality Prolonged QT Abnormal ECG Interpretation limited secondary to artifact needs repeat EKG Confirmed by Ripley Fraise [id number]) on 12/01/2016 12:12:16 AM       Radiology Dg Chest 2 View  Result Date: 12/01/2016 CLINICAL DATA:  Acute onset of shortness of breath and bilateral leg swelling. Initial encounter. EXAM: CHEST  2 VIEW COMPARISON:  Chest radiograph performed 09/13/2016 FINDINGS: The lungs are well-aerated. Mild left basilar airspace opacity may reflect atelectasis or possibly mild pneumonia. There is no evidence of pleural effusion or pneumothorax. The heart is normal in size; the mediastinal contour is within normal limits. No acute osseous abnormalities are seen. IMPRESSION: Mild left basilar airspace opacity may reflect atelectasis or possibly mild pneumonia. Electronically Signed   By: Garald Balding M.D.   On: 12/01/2016 00:51    Procedures Procedures (including critical care time)  Medications Ordered in ED Medications - No data to display   Initial Impression / Assessment and Plan / ED Course  I have reviewed the triage vital signs and the nursing notes.  Pertinent labs & imaging results that were available during my care of the patient were  reviewed by me and considered in my medical decision making (see chart for details).     Patient with c/o SOB with exertion or lying position, history of CHF. No fever or chest pain. Extra dose of Lasix today without significant change.   CXR shows atx vs PNA - doubt PNA without significant cough or fever. No edema.   VSS, no hypoxia. She is ambulatory without difficulty or hypoxia here. No pain. Labs are normal or at baseline for patient. She is clinically in failure and will require additional Lasix. No not suspect acute ischemic event or pulmonary compromise.  She is examined by Dr. Dina Rich and is felt appropriate for discharge home. Will increase her Lasix and have close follow up with PCP.  Final Clinical Impressions(s) / ED Diagnoses   Final diagnoses:  None   1. Acute on chronic CHF  New Prescriptions New Prescriptions   No medications on file     Charlann Lange, Hershal Coria 12/05/16 2230    Merryl Hacker, MD 12/06/16 [phone]

## 2016-12-01 NOTE — ED Notes (Signed)
AMBULATE PT. IN THE HALLWAY 02SATS 94%.HEARTRATE 89.PT.DID WELL WALKIN BACK TO THE ROOM.

## 2016-12-01 NOTE — ED Notes (Signed)
Failed IV attempt x2

## 2016-12-01 NOTE — Discharge Instructions (Signed)
Take Lasix 80 mg daily - take either 2 of your 40 mg pills in the morning, or one in the morning and one in the evening.  Follow up with your doctor in one week for recheck of shortness of breath and kidney function.

## 2016-12-03 ENCOUNTER — Other Ambulatory Visit: Payer: Self-pay | Admitting: Family Medicine

## 2016-12-03 DIAGNOSIS — R42 Dizziness and giddiness: Secondary | ICD-10-CM

## 2016-12-10 MED FILL — ATORVASTATIN 80 MG TABLET: 80 | 30 days supply | Qty: 30 | Fill #2

## 2016-12-11 ENCOUNTER — Ambulatory Visit: Payer: Self-pay | Admitting: Family Medicine

## 2016-12-12 ENCOUNTER — Emergency Department (HOSPITAL_COMMUNITY): Payer: Medicaid Other

## 2016-12-12 ENCOUNTER — Encounter (HOSPITAL_COMMUNITY): Payer: Self-pay

## 2016-12-12 ENCOUNTER — Inpatient Hospital Stay (HOSPITAL_COMMUNITY)
Admission: EM | Admit: 2016-12-12 | Discharge: 2016-12-14 | DRG: 638 | Disposition: A | Discharge status: Home / Self Care | Payer: Medicaid Other | Attending: Internal Medicine | Admitting: Internal Medicine

## 2016-12-12 DIAGNOSIS — I5042 Chronic combined systolic (congestive) and diastolic (congestive) heart failure: Secondary | ICD-10-CM | POA: Diagnosis present

## 2016-12-12 DIAGNOSIS — N179 Acute kidney failure, unspecified: Secondary | ICD-10-CM | POA: Diagnosis present

## 2016-12-12 DIAGNOSIS — Z955 Presence of coronary angioplasty implant and graft: Secondary | ICD-10-CM

## 2016-12-12 DIAGNOSIS — I10 Essential (primary) hypertension: Secondary | ICD-10-CM | POA: Diagnosis present

## 2016-12-12 DIAGNOSIS — E875 Hyperkalemia: Secondary | ICD-10-CM | POA: Diagnosis present

## 2016-12-12 DIAGNOSIS — I130 Hypertensive heart and chronic kidney disease with heart failure and stage 1 through stage 4 chronic kidney disease, or unspecified chronic kidney disease: Secondary | ICD-10-CM | POA: Diagnosis present

## 2016-12-12 DIAGNOSIS — E1122 Type 2 diabetes mellitus with diabetic chronic kidney disease: Secondary | ICD-10-CM | POA: Diagnosis present

## 2016-12-12 DIAGNOSIS — E1100 Type 2 diabetes mellitus with hyperosmolarity without nonketotic hyperglycemic-hyperosmolar coma (NKHHC): Principal | ICD-10-CM | POA: Diagnosis present

## 2016-12-12 DIAGNOSIS — I2510 Atherosclerotic heart disease of native coronary artery without angina pectoris: Secondary | ICD-10-CM | POA: Diagnosis present

## 2016-12-12 DIAGNOSIS — Z7984 Long term (current) use of oral hypoglycemic drugs: Secondary | ICD-10-CM

## 2016-12-12 DIAGNOSIS — E118 Type 2 diabetes mellitus with unspecified complications: Secondary | ICD-10-CM

## 2016-12-12 DIAGNOSIS — K219 Gastro-esophageal reflux disease without esophagitis: Secondary | ICD-10-CM | POA: Diagnosis present

## 2016-12-12 DIAGNOSIS — R739 Hyperglycemia, unspecified: Secondary | ICD-10-CM

## 2016-12-12 DIAGNOSIS — E860 Dehydration: Secondary | ICD-10-CM | POA: Diagnosis present

## 2016-12-12 DIAGNOSIS — N183 Chronic kidney disease, stage 3 unspecified: Secondary | ICD-10-CM | POA: Diagnosis present

## 2016-12-12 DIAGNOSIS — Z91013 Allergy to seafood: Secondary | ICD-10-CM

## 2016-12-12 DIAGNOSIS — Z833 Family history of diabetes mellitus: Secondary | ICD-10-CM

## 2016-12-12 DIAGNOSIS — F429 Obsessive-compulsive disorder, unspecified: Secondary | ICD-10-CM | POA: Diagnosis present

## 2016-12-12 DIAGNOSIS — Z79899 Other long term (current) drug therapy: Secondary | ICD-10-CM

## 2016-12-12 DIAGNOSIS — N189 Chronic kidney disease, unspecified: Secondary | ICD-10-CM

## 2016-12-12 DIAGNOSIS — Z7982 Long term (current) use of aspirin: Secondary | ICD-10-CM

## 2016-12-12 DIAGNOSIS — Z885 Allergy status to narcotic agent status: Secondary | ICD-10-CM

## 2016-12-12 DIAGNOSIS — Z531 Procedure and treatment not carried out because of patient's decision for reasons of belief and group pressure: Secondary | ICD-10-CM | POA: Diagnosis present

## 2016-12-12 DIAGNOSIS — IMO0002 Reserved for concepts with insufficient information to code with codable children: Secondary | ICD-10-CM

## 2016-12-12 DIAGNOSIS — I252 Old myocardial infarction: Secondary | ICD-10-CM

## 2016-12-12 DIAGNOSIS — E785 Hyperlipidemia, unspecified: Secondary | ICD-10-CM | POA: Diagnosis present

## 2016-12-12 DIAGNOSIS — Z7951 Long term (current) use of inhaled steroids: Secondary | ICD-10-CM

## 2016-12-12 DIAGNOSIS — T501X5A Adverse effect of loop [high-ceiling] diuretics, initial encounter: Secondary | ICD-10-CM | POA: Diagnosis present

## 2016-12-12 DIAGNOSIS — B182 Chronic viral hepatitis C: Secondary | ICD-10-CM | POA: Diagnosis present

## 2016-12-12 DIAGNOSIS — J45909 Unspecified asthma, uncomplicated: Secondary | ICD-10-CM | POA: Diagnosis present

## 2016-12-12 DIAGNOSIS — E871 Hypo-osmolality and hyponatremia: Secondary | ICD-10-CM | POA: Diagnosis present

## 2016-12-12 DIAGNOSIS — E1165 Type 2 diabetes mellitus with hyperglycemia: Secondary | ICD-10-CM

## 2016-12-12 DIAGNOSIS — D631 Anemia in chronic kidney disease: Secondary | ICD-10-CM | POA: Diagnosis present

## 2016-12-12 DIAGNOSIS — Z794 Long term (current) use of insulin: Secondary | ICD-10-CM

## 2016-12-12 DIAGNOSIS — N184 Chronic kidney disease, stage 4 (severe): Secondary | ICD-10-CM | POA: Diagnosis present

## 2016-12-12 DIAGNOSIS — Z87891 Personal history of nicotine dependence: Secondary | ICD-10-CM

## 2016-12-12 DIAGNOSIS — E872 Acidosis: Secondary | ICD-10-CM | POA: Diagnosis present

## 2016-12-12 DIAGNOSIS — G4733 Obstructive sleep apnea (adult) (pediatric): Secondary | ICD-10-CM | POA: Diagnosis present

## 2016-12-12 DIAGNOSIS — N289 Disorder of kidney and ureter, unspecified: Secondary | ICD-10-CM

## 2016-12-12 DIAGNOSIS — Z6841 Body Mass Index (BMI) 40.0 and over, adult: Secondary | ICD-10-CM

## 2016-12-12 LAB — BASIC METABOLIC PANEL
Anion gap: 13 (ref 5–15)
BUN: 76 mg/dL — ABNORMAL HIGH (ref 6–20)
CO2: 18 mmol/L — ABNORMAL LOW (ref 22–32)
Calcium: 8.1 mg/dL — ABNORMAL LOW (ref 8.9–10.3)
Chloride: 90 mmol/L — ABNORMAL LOW (ref 101–111)
Creatinine, Ser: 3.39 mg/dL — ABNORMAL HIGH (ref 0.44–1.00)
GFR calc Af Amer: 16 mL/min — ABNORMAL LOW (ref >60)
GFR calc non Af Amer: 14 mL/min — ABNORMAL LOW (ref >60)
Glucose, Bld: 734 mg/dL (ref 65–99)
Potassium: 5.4 mmol/L — ABNORMAL HIGH (ref 3.5–5.1)
Sodium: 121 mmol/L — ABNORMAL LOW (ref 135–145)

## 2016-12-12 LAB — CBC
HCT: 33.3 % — ABNORMAL LOW (ref 36.0–46.0)
Hemoglobin: 10.7 g/dL — ABNORMAL LOW (ref 12.0–15.0)
MCH: 27 pg (ref 26.0–34.0)
MCHC: 32.1 g/dL (ref 30.0–36.0)
MCV: 83.9 fL (ref 78.0–100.0)
Platelets: 229 10*3/uL (ref 150–400)
RBC: 3.97 MIL/uL (ref 3.87–5.11)
RDW: 13.5 % (ref 11.5–15.5)
WBC: 8.5 10*3/uL (ref 4.0–10.5)

## 2016-12-12 LAB — URINALYSIS, ROUTINE W REFLEX MICROSCOPIC
Bilirubin Urine: NEGATIVE
Glucose, UA: >=500 mg/dL — AB
Hgb urine dipstick: NEGATIVE
Ketones, ur: NEGATIVE mg/dL
Leukocytes, UA: NEGATIVE
Nitrite: NEGATIVE
Protein, ur: 100 mg/dL — AB
Specific Gravity, Urine: 1.011 (ref 1.005–1.030)
pH: 5 (ref 5.0–8.0)

## 2016-12-12 LAB — CBG MONITORING, ED
Glucose-Capillary: >600 mg/dL (ref 65–99)
Glucose-Capillary: >600 mg/dL (ref 65–99)

## 2016-12-12 MED ORDER — SODIUM CHLORIDE 0.9 % IV BOLUS (SEPSIS): 1000.0000 mL | Freq: Once | INTRAVENOUS | Status: DC

## 2016-12-12 MED ORDER — SODIUM CHLORIDE 0.9 % IV SOLN
INTRAVENOUS | Status: DC
  Administered 2016-12-12: 5.4 [IU]/h via INTRAVENOUS
  Filled 2016-12-12: qty 1

## 2016-12-12 MED ORDER — SODIUM CHLORIDE 0.9 % IV SOLN
INTRAVENOUS | Status: DC
Start: 2016-12-12 — End: 2016-12-14
  Administered 2016-12-12: 125 mL/h via INTRAVENOUS

## 2016-12-12 NOTE — ED Triage Notes (Signed)
Pt complaining of lightheaded and dizziness with standing. Pt denies any chest pain or SOB. Pt states hx of vertigo. Pt states feel similar.

## 2016-12-12 NOTE — ED Notes (Signed)
EDP made aware that IV infiltrated. Aaron Edelman RN is going to attempt IV insertion with ultrasound.

## 2016-12-12 NOTE — ED Provider Notes (Signed)
[facility] DEPT Provider Note   CSN: [id number] Arrival date & time: 12/12/16  1859     History   Chief Complaint Chief Complaint  Patient presents with  . Dizziness    HPI [name] is a 61 y.o. female.  HPI  Patient with a past medical history of CHF and diabetes presents with one-week history of dizziness, lightheadedness, decreased appetite, "jerking" body. Denies any vomiting or diarrhea, but she has felt nauseous. States that she has just felt fatigued and weak. Also reports seeing "shadows." She states that symptoms began suddenly without any inciting states that she takes her 60 units of Lantus every night and 25 units of NovoLog 3 times a day. Reports that her morning blood sugars usually run in the 200s. She also reports some increased shortness of breath recently. Patient denies chest pain, trouble breathing, vision changes, injury, Urinary complaints, abdominal pain.  Past Medical History:  Diagnosis Date  . Asthma    As a child   . Bronchitis   . CAD (coronary artery disease)    a. 09/2016: 50% Ost 1st Mrg stenosis, 50% 2nd Mrg stenosis, 20% Mid-Cx, 95% Prox LAD, 40% mid-LAD, and 10% dist-LAD stenosis. Staged PCI with DES to Prox-LAD.   [name] CHF (congestive heart failure) ([place]) 2011  . Complication of anesthesia   . Diabetes mellitus Dx 1989  . Hepatitis C Dx 2013  . Hypertension Dx 1989  . Obesity   . Pancreatitis 2013  . Refusal of blood transfusions as patient is Jehovah's Witness   . Ulcer 2010    Patient Active Problem List   Diagnosis Date Noted  . Right flank pain 11/19/2016  . Coronary artery disease involving native coronary artery of native heart with unstable angina pectoris ([place])   . Elevated troponin   . Acute combined systolic and diastolic congestive heart failure ([place])   . Diabetes mellitus with complication ([place])   . Acute on chronic diastolic congestive heart failure, NYHA class 4 ([place]) 09/13/2016  . NSTEMI (non-ST elevated  myocardial infarction) ([place]) 09/13/2016  . Hypertensive emergency 09/13/2016  . Acute myopericarditis   . Hypertensive urgency 09/04/2016  . Injury of left hand 02/24/2016  . Asthma 11/29/2015  . Trigger middle finger 11/25/2015  . Depression 09/05/2015  . GERD (gastroesophageal reflux disease) 03/25/2015  . Precordial pain   . Environmental allergies 03/14/2015  . Hep C w/o coma, chronic ([place]) 01/31/2015  . Diabetic neuropathy ([place]) 01/31/2015  . OSA (obstructive sleep apnea) 01/01/2015  . Poor dentition 11/13/2014  . Essential hypertension 10/08/2014  . Obesity 10/08/2014  . CKD (chronic kidney disease) stage 3, GFR 30-59 ml/min 10/08/2014  . Diabetes mellitus type 2, uncontrolled, with complications ([place]) 54/00/8676    Class: Chronic  . Chronic diastolic CHF (congestive heart failure), NYHA class 2 ([place]) 03/25/2012  . Anemia in stage 3 chronic kidney disease 03/25/2012    Past Surgical History:  Procedure Laterality Date  . CHOLECYSTECTOMY    . CORONARY STENT INTERVENTION N/A 09/18/2016   Procedure: Coronary Stent Intervention;  Surgeon: Troy Sine, MD;  Location: [facility];  Service: Cardiovascular;  Laterality: N/A;  . EYE SURGERY    . KNEE ARTHROSCOPY    . LEFT HEART CATH N/A 09/18/2016   Procedure: Left Heart Cath;  Surgeon: Troy Sine, MD;  Location: [facility];  Service: Cardiovascular;  Laterality: N/A;  . LEFT HEART CATH AND CORONARY ANGIOGRAPHY N/A 09/16/2016   Procedure: Left Heart Cath and Coronary  Angiography;  Surgeon: Burnell Blanks, MD;  Location: [facility];  Service: Cardiovascular;  Laterality: N/A;  . TUBAL LIGATION    . TUBAL LIGATION      OB History    No data available       Home Medications    Prior to Admission medications   Medication Sig Start Date End Date Taking? Authorizing Provider  acetaminophen-codeine (TYLENOL #3) 300-30 MG tablet Take 1 tablet by mouth every 8 (eight) hours as needed for moderate  pain.    [provider]  albuterol (PROVENTIL) (2.5 MG/3ML) 0.083% nebulizer solution Take 3 mLs (2.5 mg total) by nebulization every 6 (six) hours as needed for wheezing or shortness of breath. 03/25/15   Arnoldo Morale, MD  albuterol (VENTOLIN HFA) 108 (90 Base) MCG/ACT inhaler Inhale 2 puffs into the lungs every 4 (four) hours as needed for wheezing or shortness of breath. 08/03/16   Boykin Nearing, MD  aspirin EC 81 MG EC tablet Take 1 tablet (81 mg total) by mouth daily. 09/19/16   Strader, Fransisco Hertz, PA-C  atorvastatin (LIPITOR) 80 MG tablet Take 1 tablet (80 mg total) by mouth daily at 6 PM. 09/19/16   Strader, [place] M, PA-C  Blood Glucose Monitoring Suppl (TRUE METRIX METER) DEVI 1 each by Does not apply route 3 (three) times daily before meals. 10/07/16   Arnoldo Morale, MD  carvedilol (COREG) 25 MG tablet TAKE 1 TABLET BY MOUTH 2 TIMES DAILY WITH A MEAL. 11/13/16   Funches, Adriana Mccallum, MD  cyclobenzaprine (FLEXERIL) 10 MG tablet Take 1 tablet (10 mg total) by mouth 3 (three) times daily as needed for muscle spasms. 11/17/16   Funches, Adriana Mccallum, MD  diclofenac sodium (VOLTAREN) 1 % GEL Apply 4 g topically daily as needed (for pain). 11/25/15   Funches, Adriana Mccallum, MD  ferrous sulfate 325 (65 FE) MG tablet Take 1 tablet (325 mg total) by mouth 2 (two) times daily with a meal. 07/28/16   Funches, Josalyn, MD  fluticasone (FLONASE) 50 MCG/ACT nasal spray Place 2 sprays into both nostrils daily as needed for allergies or rhinitis.    [provider]  Fluticasone-Salmeterol (ADVAIR DISKUS) 250-50 MCG/DOSE AEPB Inhale 1 puff into the lungs 2 (two) times daily. 09/23/16   Arnoldo Morale, MD  furosemide (LASIX) 40 MG tablet Take 40 mg by mouth 2 (two) times daily.    [provider]  glucose blood (TRUE METRIX BLOOD GLUCOSE TEST) test strip Use 3 times before meals 10/07/16   Arnoldo Morale, MD  hydrALAZINE (APRESOLINE) 50 MG tablet Take 1 tablet (50 mg total) by mouth 3 (three) times daily.  10/07/16   Arnoldo Morale, MD  hydrOXYzine (ATARAX/VISTARIL) 50 MG tablet Take 1 tablet (50 mg total) by mouth 3 (three) times daily as needed. 09/23/16   Arnoldo Morale, MD  insulin aspart (NOVOLOG) 100 UNIT/ML FlexPen Inject 25 Units into the skin 3 (three) times daily with meals. 11/17/16   Funches, Adriana Mccallum, MD  Insulin Glargine (LANTUS SOLOSTAR) 100 UNIT/ML Solostar Pen Inject 60 Units into the skin every morning. 10/27/16   Funches, Adriana Mccallum, MD  isosorbide mononitrate (IMDUR) 30 MG 24 hr tablet Take 1 tablet (30 mg total) by mouth daily. 11/10/16   Eileen Stanford, PA-C  ketotifen (ZADITOR) 0.025 % ophthalmic solution Place 1 drop into both eyes daily as needed (for dry eyes). 07/27/16   Funches, Adriana Mccallum, MD  meclizine (ANTIVERT) 25 MG tablet Take 1 tablet (25 mg total) by mouth 3 (three) times daily  as needed. 10/07/16   Arnoldo Morale, MD  pregabalin (LYRICA) 100 MG capsule Take 100 mg by mouth 3 (three) times daily.    [provider]  ranitidine (ZANTAC) 150 MG tablet Take 1 tablet (150 mg total) by mouth 2 (two) times daily. 07/27/16   Funches, Adriana Mccallum, MD  sitaGLIPtin (JANUVIA) 25 MG tablet Take 1 tablet (25 mg total) by mouth daily. For PASS 11/17/16   Boykin Nearing, MD  ticagrelor (BRILINTA) 90 MG TABS tablet Take 1 tablet (90 mg total) by mouth 2 (two) times daily. 11/06/16   Funches, Adriana Mccallum, MD  traMADol (ULTRAM) 50 MG tablet Take 1 tablet (50 mg total) by mouth every 6 (six) hours as needed for moderate pain. 09/19/16   Strader, Fransisco Hertz, PA-C  TRUEPLUS LANCETS 28G MISC 1 each by Does not apply route 3 (three) times daily before meals. 10/07/16   Arnoldo Morale, MD  valACYclovir (VALTREX) 500 MG tablet Take 1 tablet (500 mg total) by mouth daily. 09/23/16   Arnoldo Morale, MD  valACYclovir (VALTREX) 500 MG tablet TAKE 1 TABLET BY MOUTH DAILY. Patient not taking: Reported on 12/01/2016 11/20/16   Arnoldo Morale, MD    Family History Family History  Problem Relation Age of Onset  .  Colon cancer Mother   . Heart attack Other   . Heart attack Maternal Grandmother   . Hypertension Sister   . Hypertension Brother   . Diabetes Paternal Grandmother     Social History Social History  Substance Use Topics  . Smoking status: Former Smoker    Quit date: 10/25/1980  . Smokeless tobacco: Never Used     Comment: quit smoking in 1982  . Alcohol use Yes     Comment: 3 times in last year     Allergies   Shellfish allergy; Diazepam; and Morphine and related   Review of Systems Review of Systems  Constitutional: Positive for appetite change and chills. Negative for fever.  HENT: Negative for ear pain, rhinorrhea, sneezing and sore throat.   Eyes: Negative for photophobia and visual disturbance.  Respiratory: Negative for cough, chest tightness, shortness of breath and wheezing.   Cardiovascular: Negative for chest pain and palpitations.  Gastrointestinal: Positive for nausea. Negative for abdominal pain, blood in stool, constipation, diarrhea and vomiting.  Genitourinary: Negative for dysuria, hematuria and urgency.  Musculoskeletal: Negative for myalgias.  Skin: Negative for rash.  Neurological: Negative for dizziness, weakness and light-headedness.     Physical Exam Updated Vital Signs BP 134/66   Pulse 70   Temp 97.5 F (36.4 C) (Oral)   Resp 16   SpO2 97%   Physical Exam  Constitutional: She is oriented to person, place, and time. She appears well-developed and well-nourished. No distress.  HENT:  Head: Normocephalic and atraumatic.  Nose: Nose normal.  Eyes: Conjunctivae and EOM are normal. Pupils are equal, round, and reactive to light. Right eye exhibits no discharge. Left eye exhibits no discharge. No scleral icterus.  Neck: Normal range of motion. Neck supple.  Cardiovascular: Normal rate, regular rhythm, normal heart sounds and intact distal pulses.  Exam reveals no gallop and no friction rub.   No murmur heard. Pulmonary/Chest: Effort normal and  breath sounds normal. No respiratory distress.  Abdominal: Soft. Bowel sounds are normal. She exhibits no distension. There is no tenderness. There is no guarding.  Musculoskeletal: Normal range of motion. She exhibits no edema.  Neurological: She is alert and oriented to person, place, and time. She has normal strength.  No cranial nerve deficit or sensory deficit. She exhibits normal muscle tone. Coordination normal.  Reflex Scores:      Tricep reflexes are 2+ on the right side and 2+ on the left side.      Bicep reflexes are 2+ on the right side and 2+ on the left side.      Brachioradialis reflexes are 2+ on the right side and 2+ on the left side.      Patellar reflexes are 2+ on the right side and 2+ on the left side.      Achilles reflexes are 2+ on the right side and 2+ on the left side. Skin: Skin is warm and dry. No rash noted.  Psychiatric: She has a normal mood and affect.  Nursing note and vitals reviewed.    ED Treatments / Results  Labs (all labs ordered are listed, but only abnormal results are displayed) Labs Reviewed  BASIC METABOLIC PANEL - Abnormal; Notable for the following:       Result Value   Sodium 121 (*)    Potassium 5.4 (*)    Chloride 90 (*)    CO2 18 (*)    Glucose, Bld 734 (*)    BUN 76 (*)    Creatinine, Ser 3.39 (*)    Calcium 8.1 (*)    GFR calc non Af Amer 14 (*)    GFR calc Af Amer 16 (*)    All other components within normal limits  CBC - Abnormal; Notable for the following:    Hemoglobin 10.7 (*)    HCT 33.3 (*)    All other components within normal limits  URINALYSIS, ROUTINE W REFLEX MICROSCOPIC - Abnormal; Notable for the following:    APPearance HAZY (*)    Glucose, UA >=500 (*)    Protein, ur 100 (*)    Bacteria, UA RARE (*)    Squamous Epithelial / LPF 0-5 (*)    All other components within normal limits  CBG MONITORING, ED - Abnormal; Notable for the following:    Glucose-Capillary >600 (*)    All other components within normal  limits    EKG  EKG Interpretation None       Radiology No results found.  Procedures Procedures (including critical care time) CRITICAL CARE Performed by: Delia Heady   Total critical care time: 35 minutes  Critical care time was exclusive of separately billable procedures and treating other patients.  Critical care was necessary to treat or prevent imminent or life-threatening deterioration.  Critical care was time spent personally by me on the following activities: development of treatment plan with patient and/or surrogate as well as nursing, discussions with consultants, evaluation of patient's response to treatment, examination of patient, obtaining history from patient or surrogate, ordering and performing treatments and interventions, ordering and review of laboratory studies, ordering and review of radiographic studies, pulse oximetry and re-evaluation of patient's condition.   Medications Ordered in ED Medications - No data to display   Initial Impression / Assessment and Plan / ED Course  I have reviewed the triage vital signs and the nursing notes.  Pertinent labs & imaging results that were available during my care of the patient were reviewed by me and considered in my medical decision making (see chart for details).     Patient's history and symptoms concerning for acute on chronic renal failure and hyperglycemia. Patient's blood sugar 734 on arrival. Normal anion gap. Creatinine 3.39 and BUN 76. These are both increased from  her baseline (usual Cr ~2.2). EKG showed no acute changes from previous tracings. Chest x-Furr negative for any acute cardiopulmonary disease or volume overload. Troponin pending. Patient has a history of chronic kidney disease. Patient given fluids, and insulin here in the ED. She will need to be admitted for further evaluation and management of her acute on chronic renal failure and hyperglycemia.  Patient discussed with and seen by  Dr. Kathrynn Humble.  Final Clinical Impressions(s) / ED Diagnoses   Final diagnoses:  None    New Prescriptions New Prescriptions   No medications on file     Delia Heady, PA-C 12/13/16 0131    Varney Biles, MD 12/13/16 1642

## 2016-12-12 NOTE — ED Notes (Signed)
Lab called with critical Blood sugar 734

## 2016-12-13 ENCOUNTER — Encounter (HOSPITAL_COMMUNITY): Payer: Self-pay | Admitting: Internal Medicine

## 2016-12-13 DIAGNOSIS — D631 Anemia in chronic kidney disease: Secondary | ICD-10-CM

## 2016-12-13 DIAGNOSIS — E860 Dehydration: Secondary | ICD-10-CM | POA: Diagnosis present

## 2016-12-13 DIAGNOSIS — E1100 Type 2 diabetes mellitus with hyperosmolarity without nonketotic hyperglycemic-hyperosmolar coma (NKHHC): Secondary | ICD-10-CM | POA: Diagnosis present

## 2016-12-13 DIAGNOSIS — B182 Chronic viral hepatitis C: Secondary | ICD-10-CM | POA: Diagnosis present

## 2016-12-13 DIAGNOSIS — J45909 Unspecified asthma, uncomplicated: Secondary | ICD-10-CM | POA: Diagnosis present

## 2016-12-13 DIAGNOSIS — N179 Acute kidney failure, unspecified: Secondary | ICD-10-CM

## 2016-12-13 DIAGNOSIS — E871 Hypo-osmolality and hyponatremia: Secondary | ICD-10-CM | POA: Diagnosis present

## 2016-12-13 DIAGNOSIS — N184 Chronic kidney disease, stage 4 (severe): Secondary | ICD-10-CM | POA: Diagnosis present

## 2016-12-13 DIAGNOSIS — Z531 Procedure and treatment not carried out because of patient's decision for reasons of belief and group pressure: Secondary | ICD-10-CM | POA: Diagnosis present

## 2016-12-13 DIAGNOSIS — I2510 Atherosclerotic heart disease of native coronary artery without angina pectoris: Secondary | ICD-10-CM | POA: Diagnosis present

## 2016-12-13 DIAGNOSIS — K219 Gastro-esophageal reflux disease without esophagitis: Secondary | ICD-10-CM

## 2016-12-13 DIAGNOSIS — Z79899 Other long term (current) drug therapy: Secondary | ICD-10-CM | POA: Diagnosis present

## 2016-12-13 DIAGNOSIS — N183 Chronic kidney disease, stage 3 (moderate): Secondary | ICD-10-CM

## 2016-12-13 DIAGNOSIS — I130 Hypertensive heart and chronic kidney disease with heart failure and stage 1 through stage 4 chronic kidney disease, or unspecified chronic kidney disease: Secondary | ICD-10-CM | POA: Diagnosis present

## 2016-12-13 DIAGNOSIS — F429 Obsessive-compulsive disorder, unspecified: Secondary | ICD-10-CM | POA: Diagnosis present

## 2016-12-13 DIAGNOSIS — I10 Essential (primary) hypertension: Secondary | ICD-10-CM

## 2016-12-13 DIAGNOSIS — E875 Hyperkalemia: Secondary | ICD-10-CM | POA: Diagnosis present

## 2016-12-13 DIAGNOSIS — G4733 Obstructive sleep apnea (adult) (pediatric): Secondary | ICD-10-CM | POA: Diagnosis present

## 2016-12-13 DIAGNOSIS — I5032 Chronic diastolic (congestive) heart failure: Secondary | ICD-10-CM

## 2016-12-13 DIAGNOSIS — Z6841 Body Mass Index (BMI) 40.0 and over, adult: Secondary | ICD-10-CM | POA: Diagnosis not present

## 2016-12-13 DIAGNOSIS — E1122 Type 2 diabetes mellitus with diabetic chronic kidney disease: Secondary | ICD-10-CM | POA: Diagnosis present

## 2016-12-13 DIAGNOSIS — E785 Hyperlipidemia, unspecified: Secondary | ICD-10-CM | POA: Diagnosis present

## 2016-12-13 DIAGNOSIS — T501X5A Adverse effect of loop [high-ceiling] diuretics, initial encounter: Secondary | ICD-10-CM | POA: Diagnosis present

## 2016-12-13 DIAGNOSIS — E872 Acidosis: Secondary | ICD-10-CM | POA: Diagnosis present

## 2016-12-13 DIAGNOSIS — I5042 Chronic combined systolic (congestive) and diastolic (congestive) heart failure: Secondary | ICD-10-CM | POA: Diagnosis present

## 2016-12-13 HISTORY — DX: Type 2 diabetes mellitus with hyperosmolarity without nonketotic hyperglycemic-hyperosmolar coma (NKHHC): E11.00

## 2016-12-13 LAB — BASIC METABOLIC PANEL
Anion gap: 14 (ref 5–15)
Anion gap: 14 (ref 5–15)
BUN: 83 mg/dL — ABNORMAL HIGH (ref 6–20)
BUN: 83 mg/dL — ABNORMAL HIGH (ref 6–20)
CO2: 19 mmol/L — ABNORMAL LOW (ref 22–32)
CO2: 19 mmol/L — ABNORMAL LOW (ref 22–32)
Calcium: 8.4 mg/dL — ABNORMAL LOW (ref 8.9–10.3)
Calcium: 8.3 mg/dL — ABNORMAL LOW (ref 8.9–10.3)
Chloride: 92 mmol/L — ABNORMAL LOW (ref 101–111)
Chloride: 96 mmol/L — ABNORMAL LOW (ref 101–111)
Creatinine, Ser: 3.37 mg/dL — ABNORMAL HIGH (ref 0.44–1.00)
Creatinine, Ser: 3.09 mg/dL — ABNORMAL HIGH (ref 0.44–1.00)
GFR calc Af Amer: 16 mL/min — ABNORMAL LOW (ref >60)
GFR calc Af Amer: 18 mL/min — ABNORMAL LOW (ref >60)
GFR calc non Af Amer: 14 mL/min — ABNORMAL LOW (ref >60)
GFR calc non Af Amer: 16 mL/min — ABNORMAL LOW (ref >60)
Glucose, Bld: 609 mg/dL (ref 65–99)
Glucose, Bld: 284 mg/dL — ABNORMAL HIGH (ref 65–99)
Potassium: 4.3 mmol/L (ref 3.5–5.1)
Potassium: 3.4 mmol/L — ABNORMAL LOW (ref 3.5–5.1)
Sodium: 125 mmol/L — ABNORMAL LOW (ref 135–145)
Sodium: 129 mmol/L — ABNORMAL LOW (ref 135–145)

## 2016-12-13 LAB — GLUCOSE, CAPILLARY
Glucose-Capillary: 448 mg/dL — ABNORMAL HIGH (ref 65–99)
Glucose-Capillary: 335 mg/dL — ABNORMAL HIGH (ref 65–99)
Glucose-Capillary: 249 mg/dL — ABNORMAL HIGH (ref 65–99)
Glucose-Capillary: 200 mg/dL — ABNORMAL HIGH (ref 65–99)
Glucose-Capillary: 168 mg/dL — ABNORMAL HIGH (ref 65–99)
Glucose-Capillary: 147 mg/dL — ABNORMAL HIGH (ref 65–99)
Glucose-Capillary: 256 mg/dL — ABNORMAL HIGH (ref 65–99)
Glucose-Capillary: 348 mg/dL — ABNORMAL HIGH (ref 65–99)
Glucose-Capillary: 358 mg/dL — ABNORMAL HIGH (ref 65–99)

## 2016-12-13 LAB — CBG MONITORING, ED
Glucose-Capillary: >600 mg/dL (ref 65–99)
Glucose-Capillary: 573 mg/dL (ref 65–99)
Glucose-Capillary: 497 mg/dL — ABNORMAL HIGH (ref 65–99)

## 2016-12-13 LAB — MRSA PCR SCREENING: MRSA by PCR: NEGATIVE

## 2016-12-13 LAB — I-STAT TROPONIN, ED: Troponin i, poc: 0 ng/mL (ref 0.00–0.08)

## 2016-12-13 LAB — PHOSPHORUS
Phosphorus: 5.4 mg/dL — ABNORMAL HIGH (ref 2.5–4.6)
Phosphorus: 4.6 mg/dL (ref 2.5–4.6)

## 2016-12-13 LAB — MAGNESIUM: Magnesium: 2.2 mg/dL (ref 1.7–2.4)

## 2016-12-13 MED ORDER — SODIUM CHLORIDE 0.9 % IV SOLN
INTRAVENOUS | Status: AC
  Administered 2016-12-13: 09:00:00 via INTRAVENOUS

## 2016-12-13 MED ORDER — DEXTROSE-NACL 5-0.45 % IV SOLN
INTRAVENOUS | Status: DC
  Administered 2016-12-13: 07:00:00 via INTRAVENOUS

## 2016-12-13 MED ORDER — FLUTICASONE PROPIONATE 50 MCG/ACT NA SUSP
2.0000 | Freq: Every day | NASAL | Status: DC | PRN
Start: 2016-12-13 — End: 2016-12-14

## 2016-12-13 MED ORDER — TICAGRELOR 90 MG PO TABS
90.0000 mg | ORAL_TABLET | Freq: Two times a day (BID) | ORAL | Status: DC
Start: 2016-12-13 — End: 2016-12-14
  Administered 2016-12-13 – 2016-12-14 (×3): 90 mg via ORAL
  Filled 2016-12-13 (×3): qty 1

## 2016-12-13 MED ORDER — FAMOTIDINE 20 MG PO TABS
20.0000 mg | ORAL_TABLET | Freq: Two times a day (BID) | ORAL | Status: DC
  Administered 2016-12-13: 20 mg via ORAL
  Filled 2016-12-13: qty 1

## 2016-12-13 MED ORDER — POTASSIUM CHLORIDE CRYS ER 20 MEQ PO TBCR
40.0000 meq | EXTENDED_RELEASE_TABLET | Freq: Once | ORAL | Status: AC
  Administered 2016-12-13: 40 meq via ORAL
  Filled 2016-12-13: qty 2

## 2016-12-13 MED ORDER — ISOSORBIDE MONONITRATE ER 30 MG PO TB24
30.0000 mg | ORAL_TABLET | Freq: Every day | ORAL | Status: DC
  Administered 2016-12-13 – 2016-12-14 (×2): 30 mg via ORAL
  Filled 2016-12-13 (×2): qty 1

## 2016-12-13 MED ORDER — INSULIN GLARGINE 100 UNIT/ML ~~LOC~~ SOLN
25.0000 [IU] | Freq: Every day | SUBCUTANEOUS | Status: DC
  Filled 2016-12-13: qty 0.25

## 2016-12-13 MED ORDER — MOMETASONE FURO-FORMOTEROL FUM 200-5 MCG/ACT IN AERO
2.0000 | INHALATION_SPRAY | Freq: Two times a day (BID) | RESPIRATORY_TRACT | Status: DC
  Administered 2016-12-13 – 2016-12-14 (×2): 2 via RESPIRATORY_TRACT
  Filled 2016-12-13: qty 8.8

## 2016-12-13 MED ORDER — ALBUTEROL SULFATE (2.5 MG/3ML) 0.083% IN NEBU: 2.5000 mg | INHALATION_SOLUTION | Freq: Four times a day (QID) | RESPIRATORY_TRACT | Status: DC | PRN

## 2016-12-13 MED ORDER — CARVEDILOL 25 MG PO TABS
25.0000 mg | ORAL_TABLET | Freq: Two times a day (BID) | ORAL | Status: DC
  Administered 2016-12-13 – 2016-12-14 (×3): 25 mg via ORAL
  Filled 2016-12-13 (×3): qty 1

## 2016-12-13 MED ORDER — INSULIN ASPART 100 UNIT/ML ~~LOC~~ SOLN
0.0000 [IU] | Freq: Every day | SUBCUTANEOUS | Status: DC
  Administered 2016-12-13: 5 [IU] via SUBCUTANEOUS

## 2016-12-13 MED ORDER — HEPARIN SODIUM (PORCINE) 5000 UNIT/ML IJ SOLN
5000.0000 [IU] | Freq: Three times a day (TID) | INTRAMUSCULAR | Status: DC
  Administered 2016-12-13 – 2016-12-14 (×4): 5000 [IU] via SUBCUTANEOUS
  Filled 2016-12-13 (×4): qty 1

## 2016-12-13 MED ORDER — INSULIN GLARGINE 100 UNIT/ML ~~LOC~~ SOLN
50.0000 [IU] | Freq: Every day | SUBCUTANEOUS | Status: DC
  Administered 2016-12-13: 50 [IU] via SUBCUTANEOUS
  Filled 2016-12-13 (×2): qty 0.5

## 2016-12-13 MED ORDER — KETOTIFEN FUMARATE 0.025 % OP SOLN: 1.0000 [drp] | Freq: Every day | OPHTHALMIC | Status: DC | PRN

## 2016-12-13 MED ORDER — HYDRALAZINE HCL 20 MG/ML IJ SOLN
20.0000 mg | Freq: Once | INTRAMUSCULAR | Status: AC
  Administered 2016-12-13: 20 mg via INTRAVENOUS
  Filled 2016-12-13: qty 1

## 2016-12-13 MED ORDER — FAMOTIDINE 20 MG PO TABS
20.0000 mg | ORAL_TABLET | Freq: Every day | ORAL | Status: DC
  Administered 2016-12-14: 20 mg via ORAL
  Filled 2016-12-13: qty 1

## 2016-12-13 MED ORDER — FUROSEMIDE 40 MG PO TABS
40.0000 mg | ORAL_TABLET | Freq: Two times a day (BID) | ORAL | Status: DC
  Administered 2016-12-14: 40 mg via ORAL
  Filled 2016-12-13: qty 1

## 2016-12-13 MED ORDER — INSULIN ASPART 100 UNIT/ML ~~LOC~~ SOLN
0.0000 [IU] | Freq: Three times a day (TID) | SUBCUTANEOUS | Status: DC
  Administered 2016-12-13: 11 [IU] via SUBCUTANEOUS
  Administered 2016-12-13: 8 [IU] via SUBCUTANEOUS
  Administered 2016-12-14: 11 [IU] via SUBCUTANEOUS

## 2016-12-13 MED ORDER — LINAGLIPTIN 5 MG PO TABS
5.0000 mg | ORAL_TABLET | Freq: Every day | ORAL | Status: DC
  Administered 2016-12-13 – 2016-12-14 (×2): 5 mg via ORAL
  Filled 2016-12-13 (×2): qty 1

## 2016-12-13 MED ORDER — TRAMADOL HCL 50 MG PO TABS
50.0000 mg | ORAL_TABLET | Freq: Four times a day (QID) | ORAL | Status: DC | PRN
  Administered 2016-12-13: 50 mg via ORAL
  Filled 2016-12-13: qty 1

## 2016-12-13 MED ORDER — SODIUM CHLORIDE 0.45 % IV SOLN
INTRAVENOUS | Status: DC
  Administered 2016-12-13: 04:00:00 via INTRAVENOUS

## 2016-12-13 MED ORDER — PREGABALIN 50 MG PO CAPS
100.0000 mg | ORAL_CAPSULE | Freq: Three times a day (TID) | ORAL | Status: DC
Start: 2016-12-13 — End: 2016-12-14
  Administered 2016-12-13 – 2016-12-14 (×4): 100 mg via ORAL
  Filled 2016-12-13 (×4): qty 2

## 2016-12-13 MED ORDER — FUROSEMIDE 40 MG PO TABS: 40.0000 mg | ORAL_TABLET | Freq: Two times a day (BID) | ORAL | Status: DC

## 2016-12-13 MED ORDER — HYDROCODONE-ACETAMINOPHEN 5-325 MG PO TABS
1.0000 | ORAL_TABLET | Freq: Once | ORAL | Status: AC
  Administered 2016-12-13: 1 via ORAL
  Filled 2016-12-13: qty 1

## 2016-12-13 MED ORDER — HYDRALAZINE HCL 50 MG PO TABS
50.0000 mg | ORAL_TABLET | Freq: Three times a day (TID) | ORAL | Status: DC
Start: 2016-12-13 — End: 2016-12-14
  Administered 2016-12-13 – 2016-12-14 (×4): 50 mg via ORAL
  Filled 2016-12-13 (×4): qty 1

## 2016-12-13 MED ORDER — VALACYCLOVIR HCL 500 MG PO TABS
500.0000 mg | ORAL_TABLET | Freq: Every day | ORAL | Status: DC
  Administered 2016-12-13 – 2016-12-14 (×2): 500 mg via ORAL
  Filled 2016-12-13 (×2): qty 1

## 2016-12-13 MED ORDER — DEXTROSE 50 % IV SOLN: 25.0000 mL | INTRAVENOUS | Status: DC | PRN

## 2016-12-13 MED ORDER — SODIUM CHLORIDE 0.9 % IV BOLUS (SEPSIS)
1000.0000 mL | Freq: Once | INTRAVENOUS | Status: AC
  Administered 2016-12-13: 250 mL via INTRAVENOUS

## 2016-12-13 MED ORDER — INSULIN REGULAR HUMAN 100 UNIT/ML IJ SOLN
INTRAMUSCULAR | Status: DC
  Administered 2016-12-13: 07:00:00 via INTRAVENOUS
  Administered 2016-12-13: 16.2 [IU]/h via INTRAVENOUS
  Filled 2016-12-13 (×2): qty 1

## 2016-12-13 MED ORDER — CYCLOBENZAPRINE HCL 10 MG PO TABS
10.0000 mg | ORAL_TABLET | Freq: Three times a day (TID) | ORAL | Status: DC | PRN
  Administered 2016-12-13: 10 mg via ORAL
  Filled 2016-12-13: qty 1

## 2016-12-13 MED ORDER — ATORVASTATIN CALCIUM 80 MG PO TABS
80.0000 mg | ORAL_TABLET | Freq: Every day | ORAL | Status: DC
  Administered 2016-12-13: 80 mg via ORAL
  Filled 2016-12-13: qty 1

## 2016-12-13 MED ORDER — HYDROXYZINE HCL 25 MG PO TABS: 50.0000 mg | ORAL_TABLET | Freq: Three times a day (TID) | ORAL | Status: DC | PRN

## 2016-12-13 MED ORDER — MECLIZINE HCL 12.5 MG PO TABS
25.0000 mg | ORAL_TABLET | Freq: Three times a day (TID) | ORAL | Status: DC | PRN
  Administered 2016-12-13 – 2016-12-14 (×2): 25 mg via ORAL
  Filled 2016-12-13 (×2): qty 2

## 2016-12-13 MED ORDER — ASPIRIN EC 81 MG PO TBEC
81.0000 mg | DELAYED_RELEASE_TABLET | Freq: Every day | ORAL | Status: DC
  Administered 2016-12-13 – 2016-12-14 (×2): 81 mg via ORAL
  Filled 2016-12-13 (×2): qty 1

## 2016-12-13 MED ORDER — INSULIN ASPART 100 UNIT/ML ~~LOC~~ SOLN
15.0000 [IU] | Freq: Three times a day (TID) | SUBCUTANEOUS | Status: DC
  Administered 2016-12-13 (×2): 15 [IU] via SUBCUTANEOUS

## 2016-12-13 NOTE — H&P (Signed)
History and Physical    [name] PHX:[id number] DOB: 1956-03-29 DOA: 12/12/2016  PCP: Boykin Nearing, MD   Patient coming from: Home.  I have personally briefly reviewed patient's old medical records in [place]  Chief Complaint: Dizziness.  HPI: [name] is a 61 y.o. female with medical history significant of asthma, CAD, CHF, type 2 diabetes, chronic hep C, hypertension, obesity, history of pancreatitis secondary to emergency department with complaints of progressively worse dizziness, blurred vision, polyuria and polydipsia for the past 2 days. She denies medication noncompliance or dietary indiscretions. Denies chest pain, dyspnea, palpitations, diaphoresis, abdominal pain, nausea, emesis, diarrhea, constipation, melena or hematochezia. She denies dysuria or frequency.  ED Course: The patient was started on insulin infusion and given 2000 mL of normal saline bolus. Her WBC is 8.5, hemoglobin 10.7 and platelets 229. Sodium was 121 (correct the sodium to his 136), potassium 5.4, chloride 90 and bicarbonate 18 mmol/L. BUN 76, creatinine 3.39 (12 days ago her creatinine was 2.38) and glucose 734 mg/dL.  Review of Systems: As per HPI otherwise 10 point review of systems negative.    Past Medical History:  Diagnosis Date  . Asthma    As a child   . Bronchitis   . CAD (coronary artery disease)    a. 09/2016: 50% Ost 1st Mrg stenosis, 50% 2nd Mrg stenosis, 20% Mid-Cx, 95% Prox LAD, 40% mid-LAD, and 10% dist-LAD stenosis. Staged PCI with DES to Prox-LAD.   [name] CHF (congestive heart failure) ([place]) 2011  . Complication of anesthesia   . Diabetes mellitus Dx 1989  . Hepatitis C Dx 2013  . Hypertension Dx 1989  . Obesity   . Pancreatitis 2013  . Refusal of blood transfusions as patient is Jehovah's Witness   . Ulcer 2010    Past Surgical History:  Procedure Laterality Date  . CHOLECYSTECTOMY    . CORONARY STENT INTERVENTION N/A 09/18/2016   Procedure: Coronary Stent  Intervention;  Surgeon: Troy Sine, MD;  Location: [facility];  Service: Cardiovascular;  Laterality: N/A;  . EYE SURGERY    . KNEE ARTHROSCOPY    . LEFT HEART CATH N/A 09/18/2016   Procedure: Left Heart Cath;  Surgeon: Troy Sine, MD;  Location: [facility];  Service: Cardiovascular;  Laterality: N/A;  . LEFT HEART CATH AND CORONARY ANGIOGRAPHY N/A 09/16/2016   Procedure: Left Heart Cath and Coronary Angiography;  Surgeon: Burnell Blanks, MD;  Location: [facility];  Service: Cardiovascular;  Laterality: N/A;  . TUBAL LIGATION    . TUBAL LIGATION       reports that she quit smoking about 36 years ago. She has never used smokeless tobacco. She reports that she drinks alcohol. She reports that she does not use drugs.  Allergies  Allergen Reactions  . Shellfish Allergy Anaphylaxis and Swelling  . Diazepam Other (See Comments)    "felt like out of body experience"  . Morphine And Related Itching    Family History  Problem Relation Age of Onset  . Colon cancer Mother   . Heart attack Other   . Heart attack Maternal Grandmother   . Hypertension Sister   . Hypertension Brother   . Diabetes Paternal Grandmother     Prior to Admission medications   Medication Sig Start Date End Date Taking? Authorizing Provider  acetaminophen-codeine (TYLENOL #3) 300-30 MG tablet Take 1 tablet by mouth every 8 (eight) hours as needed for moderate pain.  Yes [provider]  albuterol (PROVENTIL) (2.5 MG/3ML) 0.083% nebulizer solution Take 3 mLs (2.5 mg total) by nebulization every 6 (six) hours as needed for wheezing or shortness of breath. 03/25/15  Yes Arnoldo Morale, MD  albuterol (VENTOLIN HFA) 108 (90 Base) MCG/ACT inhaler Inhale 2 puffs into the lungs every 4 (four) hours as needed for wheezing or shortness of breath. 08/03/16  Yes Funches, Josalyn, MD  aspirin EC 81 MG EC tablet Take 1 tablet (81 mg total) by mouth daily. 09/19/16  Yes Strader, [place] M,  PA-C  atorvastatin (LIPITOR) 80 MG tablet Take 1 tablet (80 mg total) by mouth daily at 6 PM. 09/19/16  Yes Strader, [place] M, PA-C  carvedilol (COREG) 25 MG tablet TAKE 1 TABLET BY MOUTH 2 TIMES DAILY WITH A MEAL. 11/13/16  Yes Funches, Josalyn, MD  cyclobenzaprine (FLEXERIL) 10 MG tablet Take 1 tablet (10 mg total) by mouth 3 (three) times daily as needed for muscle spasms. 11/17/16  Yes Funches, Josalyn, MD  diclofenac sodium (VOLTAREN) 1 % GEL Apply 4 g topically daily as needed (for pain). 11/25/15  Yes Funches, Adriana Mccallum, MD  ferrous sulfate 325 (65 FE) MG tablet Take 1 tablet (325 mg total) by mouth 2 (two) times daily with a meal. 07/28/16  Yes Funches, Josalyn, MD  fluticasone (FLONASE) 50 MCG/ACT nasal spray Place 2 sprays into both nostrils daily as needed for allergies or rhinitis.   Yes [provider]  Fluticasone-Salmeterol (ADVAIR DISKUS) 250-50 MCG/DOSE AEPB Inhale 1 puff into the lungs 2 (two) times daily. 09/23/16  Yes Arnoldo Morale, MD  furosemide (LASIX) 40 MG tablet Take 40 mg by mouth 2 (two) times daily.   Yes [provider]  hydrALAZINE (APRESOLINE) 50 MG tablet Take 1 tablet (50 mg total) by mouth 3 (three) times daily. 10/07/16  Yes Arnoldo Morale, MD  hydrOXYzine (ATARAX/VISTARIL) 50 MG tablet Take 1 tablet (50 mg total) by mouth 3 (three) times daily as needed. 09/23/16  Yes Amao, Charlane Ferretti, MD  insulin aspart (NOVOLOG) 100 UNIT/ML FlexPen Inject 25 Units into the skin 3 (three) times daily with meals. 11/17/16  Yes Funches, Josalyn, MD  Insulin Glargine (LANTUS SOLOSTAR) 100 UNIT/ML Solostar Pen Inject 60 Units into the skin every morning. Patient taking differently: Inject 60 Units into the skin daily at 10 pm.  10/27/16  Yes Funches, Josalyn, MD  isosorbide mononitrate (IMDUR) 30 MG 24 hr tablet Take 1 tablet (30 mg total) by mouth daily. 11/10/16  Yes Eileen [place], PA-C  ketotifen (ZADITOR) 0.025 % ophthalmic solution Place 1 drop into both eyes daily as needed  (for dry eyes). 07/27/16  Yes Funches, Adriana Mccallum, MD  meclizine (ANTIVERT) 25 MG tablet Take 1 tablet (25 mg total) by mouth 3 (three) times daily as needed. 10/07/16  Yes Arnoldo Morale, MD  pregabalin (LYRICA) 100 MG capsule Take 100 mg by mouth 3 (three) times daily.   Yes [provider]  ranitidine (ZANTAC) 150 MG tablet Take 1 tablet (150 mg total) by mouth 2 (two) times daily. 07/27/16  Yes Funches, Josalyn, MD  sitaGLIPtin (JANUVIA) 25 MG tablet Take 1 tablet (25 mg total) by mouth daily. For PASS 11/17/16  Yes Funches, Adriana Mccallum, MD  ticagrelor (BRILINTA) 90 MG TABS tablet Take 1 tablet (90 mg total) by mouth 2 (two) times daily. 11/06/16  Yes Funches, Josalyn, MD  traMADol (ULTRAM) 50 MG tablet Take 1 tablet (50 mg total) by mouth every 6 (six) hours as needed for moderate pain. 09/19/16  Yes Ahmed Prima, [place] M, PA-C  valACYclovir (VALTREX) 500 MG tablet Take 1 tablet (500 mg total) by mouth daily. 09/23/16  Yes Arnoldo Morale, MD  Blood Glucose Monitoring Suppl (TRUE METRIX METER) DEVI 1 each by Does not apply route 3 (three) times daily before meals. 10/07/16   Arnoldo Morale, MD  glucose blood (TRUE METRIX BLOOD GLUCOSE TEST) test strip Use 3 times before meals 10/07/16   Arnoldo Morale, MD  TRUEPLUS LANCETS 28G MISC 1 each by Does not apply route 3 (three) times daily before meals. 10/07/16   Arnoldo Morale, MD  valACYclovir (VALTREX) 500 MG tablet TAKE 1 TABLET BY MOUTH DAILY. Patient not taking: Reported on 12/01/2016 11/20/16   Arnoldo Morale, MD    Physical Exam: Vitals:   12/13/16 0145 12/13/16 0200 12/13/16 0230 12/13/16 0247  BP: (!) 173/94 (!) 178/89 (!) 164/98 (!) 178/83  Pulse: 77 78 78   Resp:      Temp:      TempSrc:      SpO2: 97% 96% 96%     Constitutional: NAD, calm, comfortable Eyes: PERRL, lids and conjunctivae normal ENMT: Mucous membranes and lips are dry. Posterior pharynx clear of any exudate or lesions. Neck: normal, supple, no masses, no thyromegaly Respiratory:  clear to auscultation bilaterally, no wheezing, no crackles. Normal respiratory effort. No accessory muscle use.  Cardiovascular: Regular rate and rhythm, no murmurs / rubs / gallops. No extremity edema. 2+ pedal pulses. No carotid bruits.  Abdomen: Soft, no tenderness, no masses palpated. No hepatosplenomegaly. Bowel sounds positive.  Musculoskeletal: no clubbing / cyanosis. Good ROM, no contractures. Normal muscle tone.  Skin: no rashes, lesions, ulcers on limited skin exam.  Neurologic: CN 2-12 grossly intact. Sensation intact, DTR normal. Strength 5/5 in all 4.  Psychiatric: Normal judgment and insight. Alert and oriented x 4. Normal mood.    Labs on Admission: I have personally reviewed following labs and imaging studies  CBC:  Recent Labs Lab 12/12/16 1932  WBC 8.5  HGB 10.7*  HCT 33.3*  MCV 83.9  PLT 989   Basic Metabolic Panel:  Recent Labs Lab 12/12/16 1932 12/13/16 0133  NA 121* 125*  K 5.4* 4.3  CL 90* 92*  CO2 18* 19*  GLUCOSE 734* 609*  BUN 76* 83*  CREATININE 3.39* 3.37*  CALCIUM 8.1* 8.4*  MG  --  2.2  PHOS  --  5.4*   GFR: CrCl cannot be calculated (Unknown ideal weight.). Liver Function Tests: No results for input(s): AST, ALT, ALKPHOS, BILITOT, PROT, ALBUMIN in the last 168 hours. No results for input(s): LIPASE, AMYLASE in the last 168 hours. No results for input(s): AMMONIA in the last 168 hours. Coagulation Profile: No results for input(s): INR, PROTIME in the last 168 hours. Cardiac Enzymes: No results for input(s): CKTOTAL, CKMB, CKMBINDEX, TROPONINI in the last 168 hours. BNP (last 3 results)  Recent Labs  11/10/16 1418  PROBNP 202   HbA1C: No results for input(s): HGBA1C in the last 72 hours. CBG:  Recent Labs Lab 12/12/16 2115 12/12/16 2339 12/13/16 0105 12/13/16 0213  GLUCAP >600* >600* >600* 573*   Lipid Profile: No results for input(s): CHOL, HDL, LDLCALC, TRIG, CHOLHDL, LDLDIRECT in the last 72 hours. Thyroid Function  Tests: No results for input(s): TSH, T4TOTAL, FREET4, T3FREE, THYROIDAB in the last 72 hours. Anemia Panel: No results for input(s): VITAMINB12, FOLATE, FERRITIN, TIBC, IRON, RETICCTPCT in the last 72 hours. Urine analysis:    Component Value Date/Time   COLORURINE YELLOW 12/12/2016  1948   APPEARANCEUR HAZY (A) 12/12/2016 1948   LABSPEC 1.011 12/12/2016 1948   PHURINE 5.0 12/12/2016 1948   GLUCOSEU >=500 (A) 12/12/2016 1948   HGBUR NEGATIVE 12/12/2016 1948   BILIRUBINUR NEGATIVE 12/12/2016 1948   BILIRUBINUR NEGATIVE 11/17/2016 [place] 12/12/2016 1948   PROTEINUR 100 (A) 12/12/2016 1948   UROBILINOGEN 0.2 11/17/2016 1631   UROBILINOGEN 0.2 05/29/2010 2100   NITRITE NEGATIVE 12/12/2016 1948   LEUKOCYTESUR NEGATIVE 12/12/2016 1948    Radiological Exams on Admission: Dg Chest Port 1 View  Result Date: 12/12/2016 CLINICAL DATA:  Shortness of breath today EXAM: PORTABLE CHEST 1 VIEW COMPARISON:  12/01/2016 FINDINGS: Mild cardiomegaly. A coronary stent is noted. Minimal linear atelectasis at the right base. No focal consolidation or pleural effusion. No pneumothorax. IMPRESSION: Mild cardiomegaly.  No edema or infiltrate. Electronically Signed   By: Donavan Foil M.D.   On: 12/12/2016 23:20    EKG: Independently reviewed. Vent. rate 69 BPM PR interval 126 ms QRS duration 100 ms QT/QTc 450/482 ms P-R-T axes 55 16 -25 Normal sinus rhythm Left ventricular hypertrophy T wave abnormality, consider inferior ischemia Prolonged QT Abnormal ECG  Assessment/Plan Principal Problem:   Diabetic hyperosmolar non-ketotic state ([place]) Admit to stepdown/inpatient. Keep nothing by mouth. Continue IV fluids. Continue insulin infusion. Continue Januvia or generic equivalent. CBG monitoring every hour while on insulin infusion. Follow-up electrolytes and renal function closely.  Active Problems:   AKI (acute kidney injury) ([place]) Continue IV hydration. Monitor renal  function and electrolytes.    Chronic diastolic CHF (congestive heart failure), NYHA class 2 (H[facility]) Continue carvedilol, hydralazine and Imdur. Hold furosemide.    Anemia in stage 3 chronic kidney disease Monitor hematocrit and hemoglobin.    Essential hypertension Continue carvedilol 25 mg by mouth twice a day. Continue hydralazine 50 mg by mouth 3 times a day. Hold furosemide.    OSA (obstructive sleep apnea) Not on CPAP.    GERD (gastroesophageal reflux disease) Famotidine 20 mg by mouth twice a day.    Hyperphosphatemia Continue IV hydration. Follow-up phosphorus level.    CAD (coronary artery disease) Continue aspirin, Brilinta, carvedilol and atorvastatin.    Hyperlipidemia Continue atorvastatin 80 mg by mouth daily. Monitor fasting lipids and LFTs as an outpatient.       DVT prophylaxis: Heparin SQ. Code Status: Full code. Family Communication:  Disposition Plan: Admit for IV rehydration and IV insulin infusion. Consults called:  Admission status: Inpatient/SDU   Reubin Milan MD [facility] Pager [phone].  If 7PM-7AM, please contact night-coverage [email or web address] Password [email or web address]  12/13/2016, 3:00 AM

## 2016-12-13 NOTE — Progress Notes (Addendum)
@[email or web address]         PROGRESS NOTE                                                                                                                                                                                                             Patient Demographics:    [name], is a 61 y.o. female, DOB - 06-Mar-1956, DQQ:[id number]  Admit date - 12/12/2016   Admitting Physician Reubin Milan, MD  Outpatient Primary MD for the patient is Boykin Nearing, MD  LOS - 0  Chief Complaint  Patient presents with  . Hyperglycemia  . Dizziness       Brief Narrative   [name] is a 61 y.o. female with medical history significant of asthma, CAD, CHF, type 2 diabetes, chronic hep C, hypertension, obesity, history of pancreatitis secondary to emergency department with complaints of progressively worse dizziness, blurred vision, polyuria and polydipsia for the past 2 days, was admitted for [place] in DM 2 .   Subjective:    [name] today has, No headache, No chest pain, No abdominal pain - No Nausea, No new weakness tingling or numbness, No Cough - SOB.    Assessment  & Plan :     1.DM2 with NKH - Poor control in the outpatient setting historically, current A1c pending, initially kept on IV insulin drip along with fluids, electrolytes much better, we will place her on Lantus, pre-meal NovoLog along with sliding scale. Low-carb diet and monitor. Diabetic education. She claims that she is compliant.  2. Chronic systolic and diastolic heart failure EF 45%. Currently Lasix on hold due to dehydration and ARF from #1 above, continue Coreg hydralazine and Imdur.  3. ARF in CK D stage IV. Baseline creatinine close to 2.5, this is prerenal, will hydrate and monitor. Hold Lasix.  4. GERD. On famotidine.  5. A OCD. Stable after accounting for hemodilution.  6. Dyslipidemia. Continue statin.  7. Morbid obesity with obstructive sleep apnea. Follow with PCP of weight loss, currently does not use CPAP,  defer to PCP.  8. Essential hypertension. Continue combination of Coreg, Imdur and hydralazine.  9. CAD -  no acute issue on aspirin, [place], statin along with Coreg for secondary prevention. Continue. Outpatient follow-up with primary cardio just post discharge.    Diet : Diet heart healthy/carb modified Room service appropriate? Yes; Fluid consistency: Thin; Fluid restriction: 1500 mL Fluid    Family Communication  :  None  Code Status :  Full  Disposition Plan  :  Home  Consults  :  None  Procedures  :  None  DVT Prophylaxis  :    Heparin   Lab Results  Component Value Date   PLT 229 12/12/2016   Lab Results  Component Value Date   HGBA1C 8.0 (H) 09/13/2016    Inpatient Medications  Scheduled Meds: . aspirin EC  81 mg Oral Daily  . atorvastatin  80 mg Oral q1800  . carvedilol  25 mg Oral BID WC  . famotidine  20 mg Oral BID  . furosemide  40 mg Oral BID  . heparin  5,000 Units Subcutaneous Q8H  . hydrALAZINE  50 mg Oral TID  . insulin aspart  0-15 Units Subcutaneous TID WC  . insulin aspart  0-5 Units Subcutaneous QHS  . insulin aspart  15 Units Subcutaneous TID WC  . insulin glargine  50 Units Subcutaneous Daily  . isosorbide mononitrate  30 mg Oral Daily  . linagliptin  5 mg Oral Daily  . mometasone-formoterol  2 puff Inhalation BID  . potassium chloride  40 mEq Oral Once  . pregabalin  100 mg Oral TID  . ticagrelor  90 mg Oral BID  . valACYclovir  500 mg Oral Daily   Continuous Infusions: . sodium chloride Stopped (12/12/16 2232)   And  . sodium chloride Stopped (12/12/16 2236)  . sodium chloride     PRN Meds:.albuterol, cyclobenzaprine, dextrose, fluticasone, hydrOXYzine, ketotifen, meclizine, traMADol  Antibiotics  :    Anti-infectives    Start     Dose/Rate Route Frequency Ordered Stop   12/13/16 1000  valACYclovir (VALTREX) tablet 500 mg     500 mg Oral Daily 12/13/16 0345           Objective:   Vitals:   12/13/16 0615 12/13/16 0700  12/13/16 0800 12/13/16 0822  BP: (!) 158/72 (!) 150/72 (!) 151/71 (!) 169/80  Pulse: 79 78 78 84  Resp: 11 12 19 18   Temp:    97.9 F (36.6 C)  TempSrc:    Oral  SpO2: 96% 96% 98% 96%  Weight:      Height:        Wt Readings from Last 3 Encounters:  12/13/16 135.3 kg (298 lb 4.5 oz)  11/17/16 (!) 136.6 kg (301 lb 3.2 oz)  11/10/16 133.9 kg (295 lb 1.9 oz)     Intake/Output Summary (Last 24 hours) at 12/13/16 0912 Last data filed at 12/13/16 0600  Gross per 24 hour  Intake          1301.72 ml  Output                0 ml  Net          1301.72 ml     Physical Exam  Awake Alert, Oriented X 3, No new F.N deficits, Normal affect [place].AT,PERRAL Supple Neck,No JVD, No cervical lymphadenopathy appriciated.  Symmetrical Chest wall movement, Good air movement bilaterally, CTAB RRR,No Gallops,Rubs or new Murmurs, No Parasternal Heave +ve B.Sounds, Abd Soft, No tenderness, No organomegaly appriciated, No rebound - guarding or rigidity. No Cyanosis, Clubbing or edema, No new Rash or bruise      Data Review:    CBC  Recent Labs Lab 12/12/16 1932  WBC 8.5  HGB 10.7*  HCT 33.3*  PLT 229  MCV 83.9  MCH 27.0  MCHC 32.1  RDW 13.5    Chemistries   Recent Labs Lab 12/12/16 1932 12/13/16 0133 12/13/16 0604  NA 121* 125* 129*  K 5.4* 4.3 3.4*  CL 90* 92* 96*  CO2 18* 19* 19*  GLUCOSE 734* 609* 284*  BUN 76* 83* 83*  CREATININE 3.39* 3.37* 3.09*  CALCIUM 8.1* 8.4* 8.3*  MG  --  2.2  --    ------------------------------------------------------------------------------------------------------------------ No results for input(s): CHOL, HDL, LDLCALC, TRIG, CHOLHDL, LDLDIRECT in the last 72 hours.  Lab Results  Component Value Date   HGBA1C 8.0 (H) 09/13/2016   ------------------------------------------------------------------------------------------------------------------ No results for input(s): TSH, T4TOTAL, T3FREE, THYROIDAB in the last 72 hours.  Invalid  input(s): FREET3 ------------------------------------------------------------------------------------------------------------------ No results for input(s): VITAMINB12, FOLATE, FERRITIN, TIBC, IRON, RETICCTPCT in the last 72 hours.  Coagulation profile No results for input(s): INR, PROTIME in the last 168 hours.  No results for input(s): DDIMER in the last 72 hours.  Cardiac Enzymes No results for input(s): CKMB, TROPONINI, MYOGLOBIN in the last 168 hours.  Invalid input(s): CK ------------------------------------------------------------------------------------------------------------------    Component Value Date/Time   BNP 124.6 (H) 12/01/2016 0009   BNP 137.2 (H) 09/23/2016 1451    Micro Results Recent Results (from the past 240 hour(s))  MRSA PCR Screening     Status: None   Collection Time: 12/13/16  3:47 AM  Result Value Ref Range Status   MRSA by PCR NEGATIVE NEGATIVE Final    Comment:        The GeneXpert MRSA Assay (FDA approved for NASAL specimens only), is one component of a comprehensive MRSA colonization surveillance program. It is not intended to diagnose MRSA infection nor to guide or monitor treatment for MRSA infections.     Radiology Reports Dg Chest 2 View  Result Date: 12/01/2016 CLINICAL DATA:  Acute onset of shortness of breath and bilateral leg swelling. Initial encounter. EXAM: CHEST  2 VIEW COMPARISON:  Chest radiograph performed 09/13/2016 FINDINGS: The lungs are well-aerated. Mild left basilar airspace opacity may reflect atelectasis or possibly mild pneumonia. There is no evidence of pleural effusion or pneumothorax. The heart is normal in size; the mediastinal contour is within normal limits. No acute osseous abnormalities are seen. IMPRESSION: Mild left basilar airspace opacity may reflect atelectasis or possibly mild pneumonia. Electronically Signed   By: Garald Balding M.D.   On: 12/01/2016 00:51   Dg Chest Port 1 View  Result Date:  12/12/2016 CLINICAL DATA:  Shortness of breath today EXAM: PORTABLE CHEST 1 VIEW COMPARISON:  12/01/2016 FINDINGS: Mild cardiomegaly. A coronary stent is noted. Minimal linear atelectasis at the right base. No focal consolidation or pleural effusion. No pneumothorax. IMPRESSION: Mild cardiomegaly.  No edema or infiltrate. Electronically Signed   By: Donavan Foil M.D.   On: 12/12/2016 23:20    Time Spent in minutes  30   Lala Lund M.D on 12/13/2016 at 9:12 AM  Between 7am to 7pm - Pager - [phone] ( page via [email or web address], text pages only, please mention full 10 digit call back number). After 7pm go to [email or web address] - password [email or web address]

## 2016-12-13 NOTE — Progress Notes (Addendum)
Inpatient Diabetes Program Recommendations  AACE/ADA: New Consensus Statement on Inpatient Glycemic Control (2015)  Target Ranges:  Prepandial:   less than 140 mg/dL      Peak postprandial:   less than 180 mg/dL (1-2 hours)      Critically ill patients:  140 - 180 mg/dL   Results for JULIENE, KIRSH (MRN [id number]) as of 12/13/2016 09:28  Ref. Range 05/19/2016 10:00 08/21/2016 21:05 09/13/2016 12:45  Hemoglobin A1C Latest Ref Range: 4.8 - 5.6 % 10.7 9.2 (H) 8.0 (H)   Review of Glycemic Control  Diabetes history: DM 2 Outpatient Diabetes medications: Novolog 25 units tid with meals, Lantus 60 units QHS, Januvia 25 mg Daily Current orders for Inpatient glycemic control: Lantus 50 units, Novolog Moderate Correction tid + Novolog 15 units tid meal coverage+ Novolog HS scale, Tradjenta 5 mg Daily  Inpatient Diabetes Program Recommendations:    Note A1c trends have decreased over the last 6 months. Awaiting Current A1c level. Have ordered the DM educational videos to start education and have attached Educational information through [place] notes. Will see patient tomorrow morning 6/4 to inquire about DM educational deficits.  Thanks,  Tama Headings RN, MSN, [facility] Inpatient Diabetes Coordinator Team Pager [phone] (8a-5p)

## 2016-12-13 NOTE — Progress Notes (Signed)
Patient instructed on how to watch education DM videos. Parient watching first video

## 2016-12-13 NOTE — Progress Notes (Signed)
Patient c/o Right ear  and jaw pain not relieved by Tramadol. MD paged

## 2016-12-14 ENCOUNTER — Other Ambulatory Visit: Payer: Self-pay

## 2016-12-14 LAB — BASIC METABOLIC PANEL
Anion gap: 10 (ref 5–15)
BUN: 88 mg/dL — ABNORMAL HIGH (ref 6–20)
CO2: 18 mmol/L — ABNORMAL LOW (ref 22–32)
Calcium: 8.3 mg/dL — ABNORMAL LOW (ref 8.9–10.3)
Chloride: 101 mmol/L (ref 101–111)
Creatinine, Ser: 2.91 mg/dL — ABNORMAL HIGH (ref 0.44–1.00)
GFR calc Af Amer: 19 mL/min — ABNORMAL LOW (ref >60)
GFR calc non Af Amer: 17 mL/min — ABNORMAL LOW (ref >60)
Glucose, Bld: 342 mg/dL — ABNORMAL HIGH (ref 65–99)
Potassium: 4.8 mmol/L (ref 3.5–5.1)
Sodium: 129 mmol/L — ABNORMAL LOW (ref 135–145)

## 2016-12-14 LAB — GLUCOSE, CAPILLARY
Glucose-Capillary: 466 mg/dL — ABNORMAL HIGH (ref 65–99)
Glucose-Capillary: 336 mg/dL — ABNORMAL HIGH (ref 65–99)

## 2016-12-14 LAB — HEMOGLOBIN A1C
Hgb A1c MFr Bld: 13.8 % — ABNORMAL HIGH (ref 4.8–5.6)
Mean Plasma Glucose: 349 mg/dL

## 2016-12-14 MED ORDER — INSULIN GLARGINE 100 UNIT/ML SOLOSTAR PEN: 35.0000 [IU] | PEN_INJECTOR | Freq: Two times a day (BID) | SUBCUTANEOUS | 0 refills | Status: DC

## 2016-12-14 MED ORDER — FUROSEMIDE 40 MG PO TABS: 40.0000 mg | ORAL_TABLET | Freq: Two times a day (BID) | ORAL | Status: DC

## 2016-12-14 MED ORDER — INSULIN GLARGINE 100 UNIT/ML ~~LOC~~ SOLN
60.0000 [IU] | Freq: Every day | SUBCUTANEOUS | Status: DC
  Administered 2016-12-14: 60 [IU] via SUBCUTANEOUS
  Filled 2016-12-14: qty 0.6

## 2016-12-14 MED ORDER — FUROSEMIDE 40 MG PO TABS: 40.0000 mg | ORAL_TABLET | Freq: Two times a day (BID) | ORAL | 1 refills | Status: DC

## 2016-12-14 MED ORDER — INSULIN ASPART 100 UNIT/ML ~~LOC~~ SOLN
20.0000 [IU] | Freq: Three times a day (TID) | SUBCUTANEOUS | Status: DC
  Administered 2016-12-14: 20 [IU] via SUBCUTANEOUS

## 2016-12-14 MED ORDER — INSULIN ASPART 100 UNIT/ML ~~LOC~~ SOLN: SUBCUTANEOUS | 0 refills | Status: DC

## 2016-12-14 MED ORDER — LIVING WELL WITH DIABETES BOOK
Freq: Once | Status: DC
  Filled 2016-12-14: qty 1

## 2016-12-14 MED FILL — !NOVOLOG 100UNITS/ML VIAL: 100/ML | 33 days supply | Qty: 10 | Fill #0

## 2016-12-14 NOTE — Discharge Summary (Addendum)
[name] HTD:[id number] DOB: Feb 13, 1956 DOA: 12/12/2016  PCP: Boykin Nearing, MD  Admit date: 12/12/2016  Discharge date: 12/14/2016  Admitted From: Home   Disposition:  Home   Recommendations for Outpatient Follow-up:   Follow up with PCP in 1-2 weeks  PCP Please obtain BMP/CBC, 2 view CXR in 1week,  (see Discharge instructions)   PCP Please follow up on the following pending results: Monitor CBGs closely   Home Health: None   Equipment/Devices: None  Consultations: None Discharge Condition: Stable   CODE STATUS: Full   Diet Recommendation: Diet heart healthy/carb modified   Chief Complaint  Patient presents with  . Hyperglycemia  . Dizziness     Brief history of present illness from the day of admission and additional interim summary    Heather M Rayis a 61 y.o.femalewith medical history significant of asthma, CAD, CHF, type 2 diabetes, chronic hep C, hypertension, obesity, history of pancreatitis secondary to emergency department with complaints of progressively worse dizziness, blurred vision, polyuria and polydipsia for the past 2 days, was admitted for [place] in DM 2 .                                                                 Hospital Course     1.DM2 with NKH - Poor control in the outpatient setting historically, current A1c pending, initially kept on IV insulin drip along with fluids, She has mild somewhat chronic metabolic acidosis likely due to renal insufficiency, anion gap stable, sugars in better control, placed on Lantus along with pre-meal NovoLog and sliding scale, Lantus regimen adjusted, sliding scale added to her home regimen, continue Januvia as before. Patient counseled on compliance as well. Request PCP to monitor A1c levels and CBGs. Patient requested to check CBGs before every  meal at bedtime write down the results in a log book and to see the PCP in 3-4 days.   2. Chronic systolic and diastolic heart failure EF 45%. Currently Lasix on hold due to dehydration and ARF from #1 above, continue Coreg hydralazine and Imdur.  3. ARF in CK D stage IV. Baseline creatinine close to 2.5, this is prerenal, Improving with hydration, will continue to hold Lasix father 24 hours thereafter follow with PCP, request PCP to repeat BMP next visit. Had mild hyponatremia which has improved with hydration, hyperkalemia resolved.  4. GERD. On famotidine.  5. A OCD. Stable after accounting for hemodilution.  6. Dyslipidemia. Continue statin.  7. Morbid obesity with obstructive sleep apnea. Follow with PCP of weight loss, currently does not use CPAP, defer to PCP.  8. Essential hypertension. Continue combination of Coreg, Imdur and hydralazine.  9. CAD -  no acute issue on aspirin, statin along with Coreg for secondary prevention. Continue. Outpatient follow-up with primary cardio just post discharge  as needed.      Discharge diagnosis     Principal Problem:   Diabetic hyperosmolar non-ketotic state ([place]) Active Problems:   Chronic diastolic CHF (congestive heart failure), NYHA class 2 (HCC)   Anemia in stage 3 chronic kidney disease   Essential hypertension   OSA (obstructive sleep apnea)   AKI (acute kidney injury) (HCC)   GERD (gastroesophageal reflux disease)   CAD (coronary artery disease)   Hyperlipidemia   Hyperphosphatemia    Discharge instructions    Discharge Instructions    Discharge instructions    Complete by:  As directed    Follow with Primary MD Boykin Nearing, MD in 3 days   Get CBC, CMP, 2 view Chest X [place] checked  by Primary MD in 3 days ( we routinely change or add medications that can affect your baseline labs and fluid status, therefore we recommend that you get the mentioned basic workup next visit with your PCP, your PCP may decide  not to get them or add new tests based on their clinical decision)  Activity: As tolerated with Full fall precautions use walker/cane & assistance as needed  Disposition Home   Diet:   Diet heart healthy/carb modified   Accuchecks 4 times/day, Once in AM empty stomach and then before each meal. Log in all results and show them to your Prim.MD in 3 days. If any glucose reading is under 80 or above 300 call your Prim MD immidiately. Follow Low glucose instructions for glucose under 80 as instructed.   For Heart failure patients - Check your Weight same time everyday, if you gain over 2 pounds, or you develop in leg swelling, experience more shortness of breath or chest pain, call your Primary MD immediately. Follow Cardiac Low Salt Diet and 1.5 lit/day fluid restriction.  On your next visit with your primary care physician please Get Medicines reviewed and adjusted.  Please request your Prim.MD to go over all Hospital Tests and Procedure/Radiological results at the follow up, please get all Hospital records sent to your Prim MD by signing hospital release before you go home.  If you experience worsening of your admission symptoms, develop shortness of breath, life threatening emergency, suicidal or homicidal thoughts you must seek medical attention immediately by calling 911 or calling your MD immediately  if symptoms less severe.  You Must read complete instructions/literature along with all the possible adverse reactions/side effects for all the Medicines you take and that have been prescribed to you. Take any new Medicines after you have completely understood and accpet all the possible adverse reactions/side effects.   Do not drive, operate heavy machinery, perform activities at heights, swimming or participation in water activities or provide baby sitting services if your were admitted for syncope or siezures until you have seen by Primary MD or a Neurologist and advised to do so  again.  Do not drive when taking Pain medications.    Do not take more than prescribed Pain, Sleep and Anxiety Medications  Special Instructions: If you have smoked or chewed Tobacco  in the last 2 yrs please stop smoking, stop any regular Alcohol  and or any Recreational drug use.  Wear Seat belts while driving.   Please note  You were cared for by a hospitalist during your hospital stay. If you have any questions about your discharge medications or the care you received while you were in the hospital after you are discharged, you can call the unit and asked to speak  with the hospitalist on call if the hospitalist that took care of you is not available. Once you are discharged, your primary care physician will handle any further medical issues. Please note that NO REFILLS for any discharge medications will be authorized once you are discharged, as it is imperative that you return to your primary care physician (or establish a relationship with a primary care physician if you do not have one) for your aftercare needs so that they can reassess your need for medications and monitor your lab values.   Increase activity slowly    Complete by:  As directed       Discharge Medications   Allergies as of 12/14/2016      Reactions   Shellfish Allergy Anaphylaxis, Swelling   Diazepam Other (See Comments)   "felt like out of body experience"   Morphine And Related Itching      Medication List    TAKE these medications   acetaminophen-codeine 300-30 MG tablet Commonly known as:  TYLENOL #3 Take 1 tablet by mouth every 8 (eight) hours as needed for moderate pain.   albuterol (2.5 MG/3ML) 0.083% nebulizer solution Commonly known as:  PROVENTIL Take 3 mLs (2.5 mg total) by nebulization every 6 (six) hours as needed for wheezing or shortness of breath.   albuterol 108 (90 Base) MCG/ACT inhaler Commonly known as:  VENTOLIN HFA Inhale 2 puffs into the lungs every 4 (four) hours as needed for  wheezing or shortness of breath.   aspirin 81 MG EC tablet Take 1 tablet (81 mg total) by mouth daily.   atorvastatin 80 MG tablet Commonly known as:  LIPITOR Take 1 tablet (80 mg total) by mouth daily at 6 PM.   carvedilol 25 MG tablet Commonly known as:  COREG TAKE 1 TABLET BY MOUTH 2 TIMES DAILY WITH A MEAL.   cyclobenzaprine 10 MG tablet Commonly known as:  FLEXERIL Take 1 tablet (10 mg total) by mouth 3 (three) times daily as needed for muscle spasms.   diclofenac sodium 1 % Gel Commonly known as:  VOLTAREN Apply 4 g topically daily as needed (for pain).   ferrous sulfate 325 (65 FE) MG tablet Take 1 tablet (325 mg total) by mouth 2 (two) times daily with a meal.   fluticasone 50 MCG/ACT nasal spray Commonly known as:  FLONASE Place 2 sprays into both nostrils daily as needed for allergies or rhinitis.   Fluticasone-Salmeterol 250-50 MCG/DOSE Aepb Commonly known as:  ADVAIR DISKUS Inhale 1 puff into the lungs 2 (two) times daily.   furosemide 40 MG tablet Commonly known as:  LASIX Take 1 tablet (40 mg total) by mouth 2 (two) times daily. Start taking on:  12/15/2016   glucose blood test strip Commonly known as:  TRUE METRIX BLOOD GLUCOSE TEST Use 3 times before meals   hydrALAZINE 50 MG tablet Commonly known as:  APRESOLINE Take 1 tablet (50 mg total) by mouth 3 (three) times daily.   hydrOXYzine 50 MG tablet Commonly known as:  ATARAX/VISTARIL Take 1 tablet (50 mg total) by mouth 3 (three) times daily as needed.   insulin aspart 100 UNIT/ML FlexPen Commonly known as:  NOVOLOG Inject 25 Units into the skin 3 (three) times daily with meals. What changed:  Another medication with the same name was added. Make sure you understand how and when to take each.   insulin aspart 100 UNIT/ML injection Commonly known as:  NOVOLOG Before each meal 3 times a day, 140-199 - 2 units,  200-250 - 4 units, 251-299 - 6 units,  300-349 - 8 units,  350 or above 10 units. Dispense  syringes and needles as needed, Ok to switch to PEN if approved. Substitute to any brand approved. DX DM2, Code E11.65 What changed:  You were already taking a medication with the same name, and this prescription was added. Make sure you understand how and when to take each.   Insulin Glargine 100 UNIT/ML Solostar Pen Commonly known as:  LANTUS SOLOSTAR Inject 35 Units into the skin 2 (two) times daily. Start taking on:  12/15/2016 What changed:  how much to take  when to take this   isosorbide mononitrate 30 MG 24 hr tablet Commonly known as:  IMDUR Take 1 tablet (30 mg total) by mouth daily.   ketotifen 0.025 % ophthalmic solution Commonly known as:  ZADITOR Place 1 drop into both eyes daily as needed (for dry eyes).   meclizine 25 MG tablet Commonly known as:  ANTIVERT Take 1 tablet (25 mg total) by mouth 3 (three) times daily as needed.   pregabalin 100 MG capsule Commonly known as:  LYRICA Take 100 mg by mouth 3 (three) times daily.   ranitidine 150 MG tablet Commonly known as:  ZANTAC Take 1 tablet (150 mg total) by mouth 2 (two) times daily.   sitaGLIPtin 25 MG tablet Commonly known as:  JANUVIA Take 1 tablet (25 mg total) by mouth daily. For PASS   ticagrelor 90 MG Tabs tablet Commonly known as:  BRILINTA Take 1 tablet (90 mg total) by mouth 2 (two) times daily.   traMADol 50 MG tablet Commonly known as:  ULTRAM Take 1 tablet (50 mg total) by mouth every 6 (six) hours as needed for moderate pain.   TRUE METRIX METER Devi 1 each by Does not apply route 3 (three) times daily before meals.   TRUEPLUS LANCETS 28G Misc 1 each by Does not apply route 3 (three) times daily before meals.   valACYclovir 500 MG tablet Commonly known as:  VALTREX Take 1 tablet (500 mg total) by mouth daily. What changed:  Another medication with the same name was removed. Continue taking this medication, and follow the directions you see here.       Follow-up Information     Boykin Nearing, MD. Schedule an appointment as soon as possible for a visit in 3 day(s).   Specialty:  Family Medicine Contact information: [place] [id number] [phone]           Major procedures and Radiology Reports - PLEASE review detailed and final reports thoroughly  -         Dg Chest 2 View  Result Date: 12/01/2016 CLINICAL DATA:  Acute onset of shortness of breath and bilateral leg swelling. Initial encounter. EXAM: CHEST  2 VIEW COMPARISON:  Chest radiograph performed 09/13/2016 FINDINGS: The lungs are well-aerated. Mild left basilar airspace opacity may reflect atelectasis or possibly mild pneumonia. There is no evidence of pleural effusion or pneumothorax. The heart is normal in size; the mediastinal contour is within normal limits. No acute osseous abnormalities are seen. IMPRESSION: Mild left basilar airspace opacity may reflect atelectasis or possibly mild pneumonia. Electronically Signed   By: Garald Balding M.D.   On: 12/01/2016 00:51   Dg Chest Port 1 View  Result Date: 12/12/2016 CLINICAL DATA:  Shortness of breath today EXAM: PORTABLE CHEST 1 VIEW COMPARISON:  12/01/2016 FINDINGS: Mild cardiomegaly. A coronary stent is noted. Minimal linear atelectasis  at the right base. No focal consolidation or pleural effusion. No pneumothorax. IMPRESSION: Mild cardiomegaly.  No edema or infiltrate. Electronically Signed   By: Donavan Foil M.D.   On: 12/12/2016 23:20    Micro Results     Recent Results (from the past 240 hour(s))  MRSA PCR Screening     Status: None   Collection Time: 12/13/16  3:47 AM  Result Value Ref Range Status   MRSA by PCR NEGATIVE NEGATIVE Final    Comment:        The GeneXpert MRSA Assay (FDA approved for NASAL specimens only), is one component of a comprehensive MRSA colonization surveillance program. It is not intended to diagnose MRSA infection nor to guide or monitor treatment for MRSA infections.     Today    Subjective    [name] today has no headache,no chest abdominal pain,no new weakness tingling or numbness, feels much better wants to go home today.     Objective   Blood pressure (!) 173/81, pulse 76, temperature 97.9 F (36.6 C), temperature source Oral, resp. rate (!) 22, height 5\' 8"  (1.727 m), weight 135.6 kg (298 lb 15.1 oz), SpO2 95 %.   Intake/Output Summary (Last 24 hours) at 12/14/16 0842 Last data filed at 12/13/16 1950  Gross per 24 hour  Intake            767.5 ml  Output                0 ml  Net            767.5 ml    Exam Awake Alert, Oriented x 3, No new F.N deficits, Normal affect [place].AT,PERRAL Supple Neck,No JVD, No cervical lymphadenopathy appriciated.  Symmetrical Chest wall movement, Good air movement bilaterally, CTAB RRR,No Gallops,Rubs or new Murmurs, No Parasternal Heave +ve B.Sounds, Abd Soft, Non tender, No organomegaly appriciated, No rebound -guarding or rigidity. No Cyanosis, Clubbing or edema, No new Rash or bruise   Data Review   CBC w Diff:  Lab Results  Component Value Date   WBC 8.5 12/12/2016   HGB 10.7 (L) 12/12/2016   HCT 33.3 (L) 12/12/2016   PLT 229 12/12/2016   LYMPHOPCT 34 09/15/2016   MONOPCT 8 09/15/2016   EOSPCT 4 09/15/2016   BASOPCT 0 09/15/2016    CMP:  Lab Results  Component Value Date   NA 129 (L) 12/14/2016   NA 143 11/17/2016   K 4.8 12/14/2016   CL 101 12/14/2016   CO2 18 (L) 12/14/2016   BUN 88 (H) 12/14/2016   BUN 52 (H) 11/17/2016   CREATININE 2.91 (H) 12/14/2016   CREATININE 1.36 (H) 09/23/2016   PROT 7.0 11/17/2016   ALBUMIN 3.6 11/17/2016   BILITOT 0.3 11/17/2016   ALKPHOS 185 (H) 11/17/2016   AST 28 11/17/2016   ALT 31 11/17/2016  .   Total Time in preparing paper work, data evaluation and todays exam - 48 minutes  Lala Lund M.D on 12/14/2016 at 8:42 AM  [facility]   Office  [phone]

## 2016-12-14 NOTE — Discharge Instructions (Signed)
Follow with Primary MD Boykin Nearing, MD in 3 days   Get CBC, CMP, 2 view Chest X [place] checked  by Primary MD in 3 days ( we routinely change or add medications that can affect your baseline labs and fluid status, therefore we recommend that you get the mentioned basic workup next visit with your PCP, your PCP may decide not to get them or add new tests based on their clinical decision)  Activity: As tolerated with Full fall precautions use walker/cane & assistance as needed  Disposition Home   Diet:   Diet heart healthy/carb modified   Accuchecks 4 times/day, Once in AM empty stomach and then before each meal. Log in all results and show them to your Prim.MD in 3 days. If any glucose reading is under 80 or above 300 call your Prim MD immidiately. Follow Low glucose instructions for glucose under 80 as instructed.   For Heart failure patients - Check your Weight same time everyday, if you gain over 2 pounds, or you develop in leg swelling, experience more shortness of breath or chest pain, call your Primary MD immediately. Follow Cardiac Low Salt Diet and 1.5 lit/day fluid restriction.  On your next visit with your primary care physician please Get Medicines reviewed and adjusted.  Please request your Prim.MD to go over all Hospital Tests and Procedure/Radiological results at the follow up, please get all Hospital records sent to your Prim MD by signing hospital release before you go home.  If you experience worsening of your admission symptoms, develop shortness of breath, life threatening emergency, suicidal or homicidal thoughts you must seek medical attention immediately by calling 911 or calling your MD immediately  if symptoms less severe.  You Must read complete instructions/literature along with all the possible adverse reactions/side effects for all the Medicines you take and that have been prescribed to you. Take any new Medicines after you have completely understood and accpet  all the possible adverse reactions/side effects.   Do not drive, operate heavy machinery, perform activities at heights, swimming or participation in water activities or provide baby sitting services if your were admitted for syncope or siezures until you have seen by Primary MD or a Neurologist and advised to do so again.  Do not drive when taking Pain medications.    Do not take more than prescribed Pain, Sleep and Anxiety Medications  Special Instructions: If you have smoked or chewed Tobacco  in the last 2 yrs please stop smoking, stop any regular Alcohol  and or any Recreational drug use.  Wear Seat belts while driving.   Please note  You were cared for by a hospitalist during your hospital stay. If you have any questions about your discharge medications or the care you received while you were in the hospital after you are discharged, you can call the unit and asked to speak with the hospitalist on call if the hospitalist that took care of you is not available. Once you are discharged, your primary care physician will handle any further medical issues. Please note that NO REFILLS for any discharge medications will be authorized once you are discharged, as it is imperative that you return to your primary care physician (or establish a relationship with a primary care physician if you do not have one) for your aftercare needs so that they can reassess your need for medications and monitor your lab values.

## 2016-12-14 NOTE — Progress Notes (Signed)
Inpatient Diabetes Program Recommendations  AACE/ADA: New Consensus Statement on Inpatient Glycemic Control (2015)  Target Ranges:  Prepandial:   less than 140 mg/dL      Peak postprandial:   less than 180 mg/dL (1-2 hours)      Critically ill patients:  140 - 180 mg/dL   Spoke with patient about diabetes and home regimen for diabetes control. Patient reports that she is followed by her PCP for diabetes management. Patient reports after heart surgery she would lay in the bed till 2am sometimes and not take am insulin. Patient states that she had not been going to the gym like she had. Discussed current A1c level 13.8%.  Discussed glucose and A1C goals. Patient reports checking glucose 3 times a day. Patient reports at times glucose reading "HIGH" but she does not call her doctor just takes an extra insulin shot. Discussed maintaining good CBG control to prevent long-term and short-term complications. Explained how hyperglycemia leads to damage within blood vessels which lead to the common complications seen with uncontrolled diabetes. Discussed impact of nutrition, exercise, stress, sickness, and medications on diabetes control. Discussed carbohydrates, carbohydrate goals per day and meal, along with portion sizes.  Patient verbalized understanding of information discussed and has no further questions at this time related to diabetes.   Thanks,  Tama Headings RN, MSN, [facility] Inpatient Diabetes Coordinator Team Pager [phone] (8a-5p)

## 2016-12-16 MED FILL — FUROSEMIDE 40 MG TABLET: 40 | 30 days supply | Qty: 60 | Fill #0

## 2016-12-17 ENCOUNTER — Ambulatory Visit: Payer: Medicaid Other | Attending: Family Medicine | Admitting: Physician Assistant

## 2016-12-17 ENCOUNTER — Encounter: Payer: Self-pay | Admitting: Physician Assistant

## 2016-12-17 VITALS — BP 122/65 | HR 74 | Temp 98.3°F | Resp 18 | Ht 68.0 in | Wt 299.0 lb

## 2016-12-17 DIAGNOSIS — E785 Hyperlipidemia, unspecified: Secondary | ICD-10-CM | POA: Diagnosis not present

## 2016-12-17 DIAGNOSIS — I10 Essential (primary) hypertension: Secondary | ICD-10-CM

## 2016-12-17 DIAGNOSIS — Z7982 Long term (current) use of aspirin: Secondary | ICD-10-CM | POA: Insufficient documentation

## 2016-12-17 DIAGNOSIS — I25110 Atherosclerotic heart disease of native coronary artery with unstable angina pectoris: Secondary | ICD-10-CM

## 2016-12-17 DIAGNOSIS — B182 Chronic viral hepatitis C: Secondary | ICD-10-CM | POA: Insufficient documentation

## 2016-12-17 DIAGNOSIS — N179 Acute kidney failure, unspecified: Secondary | ICD-10-CM | POA: Diagnosis not present

## 2016-12-17 DIAGNOSIS — I5042 Chronic combined systolic (congestive) and diastolic (congestive) heart failure: Secondary | ICD-10-CM | POA: Insufficient documentation

## 2016-12-17 DIAGNOSIS — N183 Chronic kidney disease, stage 3 unspecified: Secondary | ICD-10-CM

## 2016-12-17 DIAGNOSIS — K219 Gastro-esophageal reflux disease without esophagitis: Secondary | ICD-10-CM | POA: Diagnosis not present

## 2016-12-17 DIAGNOSIS — IMO0002 Reserved for concepts with insufficient information to code with codable children: Secondary | ICD-10-CM

## 2016-12-17 DIAGNOSIS — Z6841 Body Mass Index (BMI) 40.0 and over, adult: Secondary | ICD-10-CM | POA: Insufficient documentation

## 2016-12-17 DIAGNOSIS — G894 Chronic pain syndrome: Secondary | ICD-10-CM | POA: Diagnosis not present

## 2016-12-17 DIAGNOSIS — E1165 Type 2 diabetes mellitus with hyperglycemia: Secondary | ICD-10-CM | POA: Diagnosis not present

## 2016-12-17 DIAGNOSIS — G4733 Obstructive sleep apnea (adult) (pediatric): Secondary | ICD-10-CM | POA: Insufficient documentation

## 2016-12-17 DIAGNOSIS — M109 Gout, unspecified: Secondary | ICD-10-CM | POA: Insufficient documentation

## 2016-12-17 DIAGNOSIS — I130 Hypertensive heart and chronic kidney disease with heart failure and stage 1 through stage 4 chronic kidney disease, or unspecified chronic kidney disease: Secondary | ICD-10-CM | POA: Diagnosis present

## 2016-12-17 DIAGNOSIS — E118 Type 2 diabetes mellitus with unspecified complications: Secondary | ICD-10-CM

## 2016-12-17 DIAGNOSIS — Z91013 Allergy to seafood: Secondary | ICD-10-CM | POA: Diagnosis not present

## 2016-12-17 DIAGNOSIS — F429 Obsessive-compulsive disorder, unspecified: Secondary | ICD-10-CM | POA: Diagnosis not present

## 2016-12-17 DIAGNOSIS — Z794 Long term (current) use of insulin: Secondary | ICD-10-CM | POA: Insufficient documentation

## 2016-12-17 DIAGNOSIS — Z9109 Other allergy status, other than to drugs and biological substances: Secondary | ICD-10-CM

## 2016-12-17 DIAGNOSIS — Z885 Allergy status to narcotic agent status: Secondary | ICD-10-CM | POA: Insufficient documentation

## 2016-12-17 DIAGNOSIS — I5033 Acute on chronic diastolic (congestive) heart failure: Secondary | ICD-10-CM

## 2016-12-17 DIAGNOSIS — Z888 Allergy status to other drugs, medicaments and biological substances status: Secondary | ICD-10-CM | POA: Diagnosis not present

## 2016-12-17 DIAGNOSIS — A6000 Herpesviral infection of urogenital system, unspecified: Secondary | ICD-10-CM

## 2016-12-17 DIAGNOSIS — E1122 Type 2 diabetes mellitus with diabetic chronic kidney disease: Secondary | ICD-10-CM | POA: Insufficient documentation

## 2016-12-17 DIAGNOSIS — J45909 Unspecified asthma, uncomplicated: Secondary | ICD-10-CM | POA: Diagnosis not present

## 2016-12-17 DIAGNOSIS — D631 Anemia in chronic kidney disease: Secondary | ICD-10-CM

## 2016-12-17 DIAGNOSIS — R42 Dizziness and giddiness: Secondary | ICD-10-CM

## 2016-12-17 LAB — POCT URINALYSIS DIPSTICK
Bilirubin, UA: NEGATIVE
Blood, UA: NEGATIVE
Glucose, UA: NEGATIVE
Ketones, UA: NEGATIVE
Leukocytes, UA: NEGATIVE
Nitrite, UA: NEGATIVE
Protein, UA: >=300
Spec Grav, UA: 1.02 (ref 1.010–1.025)
Urobilinogen, UA: 0.2 E.U./dL
pH, UA: 5 (ref 5.0–8.0)

## 2016-12-17 LAB — GLUCOSE, POCT (MANUAL RESULT ENTRY): POC Glucose: 286 mg/dl — AB (ref 70–99)

## 2016-12-17 MED ORDER — ISOSORBIDE MONONITRATE ER 30 MG PO TB24: 30.0000 mg | ORAL_TABLET | Freq: Every day | ORAL | 3 refills | Status: DC

## 2016-12-17 MED ORDER — HYDROXYZINE HCL 50 MG PO TABS: 50.0000 mg | ORAL_TABLET | Freq: Three times a day (TID) | ORAL | 1 refills | Status: DC | PRN

## 2016-12-17 MED ORDER — INSULIN GLARGINE 100 UNIT/ML SOLOSTAR PEN: 35.0000 [IU] | PEN_INJECTOR | Freq: Two times a day (BID) | SUBCUTANEOUS | 0 refills | Status: DC

## 2016-12-17 MED ORDER — COLCHICINE 0.6 MG PO TABS: 0.6000 mg | ORAL_TABLET | Freq: Every day | ORAL | 2 refills | Status: DC

## 2016-12-17 MED ORDER — SITAGLIPTIN PHOSPHATE 25 MG PO TABS: 25.0000 mg | ORAL_TABLET | Freq: Every day | ORAL | 3 refills | Status: DC

## 2016-12-17 MED ORDER — ALBUTEROL SULFATE (2.5 MG/3ML) 0.083% IN NEBU: 2.5000 mg | INHALATION_SOLUTION | Freq: Four times a day (QID) | RESPIRATORY_TRACT | 1 refills | Status: DC | PRN

## 2016-12-17 MED ORDER — TICAGRELOR 90 MG PO TABS: 90.0000 mg | ORAL_TABLET | Freq: Two times a day (BID) | ORAL | 3 refills | Status: DC

## 2016-12-17 MED ORDER — HYDRALAZINE HCL 50 MG PO TABS: 50.0000 mg | ORAL_TABLET | Freq: Three times a day (TID) | ORAL | 1 refills | Status: DC

## 2016-12-17 MED ORDER — ATORVASTATIN CALCIUM 80 MG PO TABS: 80.0000 mg | ORAL_TABLET | Freq: Every day | ORAL | 11 refills | Status: DC

## 2016-12-17 MED ORDER — CARVEDILOL 25 MG PO TABS: ORAL_TABLET | ORAL | 2 refills | Status: DC

## 2016-12-17 MED ORDER — INSULIN GLARGINE 100 UNIT/ML SOLOSTAR PEN: 35.0000 [IU] | PEN_INJECTOR | Freq: Two times a day (BID) | SUBCUTANEOUS | 2 refills | Status: DC

## 2016-12-17 MED ORDER — MECLIZINE HCL 25 MG PO TABS: 25.0000 mg | ORAL_TABLET | Freq: Three times a day (TID) | ORAL | 2 refills | Status: DC | PRN

## 2016-12-17 MED ORDER — FLUTICASONE-SALMETEROL 250-50 MCG/DOSE IN AEPB: 1.0000 | INHALATION_SPRAY | Freq: Two times a day (BID) | RESPIRATORY_TRACT | 3 refills | Status: DC

## 2016-12-17 MED ORDER — FUROSEMIDE 40 MG PO TABS: 40.0000 mg | ORAL_TABLET | Freq: Two times a day (BID) | ORAL | 1 refills | Status: DC

## 2016-12-17 MED ORDER — RANITIDINE HCL 150 MG PO TABS: 150.0000 mg | ORAL_TABLET | Freq: Two times a day (BID) | ORAL | 3 refills | Status: DC

## 2016-12-17 MED ORDER — PREGABALIN 100 MG PO CAPS: 100.0000 mg | ORAL_CAPSULE | Freq: Three times a day (TID) | ORAL | 1 refills | Status: DC

## 2016-12-17 MED ORDER — FERROUS SULFATE 325 (65 FE) MG PO TABS: 325.0000 mg | ORAL_TABLET | Freq: Two times a day (BID) | ORAL | 3 refills | Status: DC

## 2016-12-17 MED ORDER — INSULIN SYRINGES (DISPOSABLE) U-100 0.5 ML MISC: 0 refills | Status: DC

## 2016-12-17 MED FILL — CARVEDILOL 25 MG TABLET: 25 | 30 days supply | Qty: 60 | Fill #0

## 2016-12-17 MED FILL — hydrOXYzine HCL 50 MG TABS: 50 | 30 days supply | Qty: 90 | Fill #0

## 2016-12-17 MED FILL — raNITIdine HCL 150 MG TABS: 150 | 30 days supply | Qty: 60 | Fill #0

## 2016-12-17 MED FILL — hydrALAZINE HCL 50 MG TABS: 50 | 30 days supply | Qty: 90 | Fill #0

## 2016-12-17 MED FILL — TRAVEL SICKNESS 25 MG TAB C: 25 | 20 days supply | Qty: 60 | Fill #0

## 2016-12-17 MED FILL — TRUEPLUS SYR 0.5ML 31GX5/16: 31G X 5/16" | 30 days supply | Qty: 100 | Fill #0

## 2016-12-17 MED FILL — COLCHICINE 0.6 MG TABLET: 0.6 | 30 days supply | Qty: 30 | Fill #0

## 2016-12-17 MED FILL — ISOSORBIDE MN ER 30 MG TAB: 30 | 30 days supply | Qty: 30 | Fill #0

## 2016-12-17 MED FILL — !ADVAIR 250/50 DISKUS: 250-50 | 30 days supply | Qty: 60 | Fill #0

## 2016-12-17 MED FILL — FERROUS SULFATE 325 MG TAB: 325 (65 FE) | 30 days supply | Qty: 60 | Fill #0

## 2016-12-17 MED FILL — ALBUTEROL 0.083% INHAL SOLN: (2.5 MG/3ML | 15 days supply | Qty: 180 | Fill #0

## 2016-12-17 NOTE — Patient Instructions (Signed)
Check blood sugars 4 times daily and record and bring to next visit

## 2016-12-17 NOTE — Progress Notes (Signed)
Patient ID: [name], female   DOB: May 16, 1956, 61 y.o.   MRN: [id number]     [name], is a 61 y.o. female  [id number]:[id number]  NKN:[id number]  DOB - October 24, 1955  Subjective:  Chief Complaint and HPI: [name] is a 61 y.o. female here today for a follow up visit After being hospitalized 12/12/2016-12/14/2016.  From hospital note:  medical history significant of asthma, CAD, CHF, type 2 diabetes, chronic hep C, hypertension, obesity, history of pancreatitis secondary to emergency department with complaints of progressively worse dizziness, blurred vision, polyuria and polydipsia for the past 2 days, was admitted for [place] in DM 2 .  Hospital course: 1.DM2 with NKH - Poor control in the outpatient setting historically, current A1c pending, initially kept on IV insulin drip along with fluids, She has mild somewhat chronic metabolic acidosis likely due to renal insufficiency, anion gap stable, sugars in better control, placed on Lantus along with pre-meal NovoLog and sliding scale, Lantus regimen adjusted, sliding scale added to her home regimen, continue Januvia as before. Patient counseled on compliance as well. Request PCP to monitor A1c levels and CBGs. Patient requested to check CBGs before every meal at bedtime write down the results in a log book and to see the PCP in 3-4 days.   2.Chronic systolic and diastolic heart failure EF 45%. Currently Lasix on hold due to dehydration and ARF from #1 above, continue Coreg hydralazine and Imdur.  3.ARF in CK D stage IV. Baseline creatinine close to 2.5, this is prerenal, Improving with hydration, will continue to hold Lasix father 24 hours thereafter follow with PCP, request PCP to repeat BMP next visit. Had mild hyponatremia which has improved with hydration, hyperkalemia resolved.  4.GERD. On famotidine.  5.A OCD. Stable after accounting for hemodilution.  6. Dyslipidemia. Continue statin.  7.Morbid obesity with obstructive sleep  apnea. Follow with PCP of weight loss, currently does not use CPAP, defer to PCP.  8.Essential hypertension. Continue combination of Coreg, Imdur and hydralazine.  9.CAD - no acute issue on aspirin, statin along with Coreg for secondary prevention. Continue. Outpatient follow-up with primary cardio just post discharge as needed.  A1C=13.8 on 12/13/2016 .  Today she presents feeling somewhat weak but improving overall.  She wants to restart gout medications.  She did get the lantus filled and has been taking it.  She needs RF on all her meds.  She says they told her she could resume the lasix at the hospital and she has been taking it(in the note it appears she was supposed to hold the Lasix until we did repeat labs).  ED/Hospital notes reviewed.    ROS:   Constitutional:  No f/c, No night sweats, No unexplained weight loss. EENT:  No vision changes, No blurry vision, No hearing changes. No mouth, throat, or ear problems.  Respiratory: No cough, No SOB Cardiac: No CP, no palpitations GI:  No abd pain, No N/V/D. GU: No Urinary s/sx Musculoskeletal: No joint pain Neuro: No headache, no dizziness, no motor weakness.  Skin: No rash Endocrine:  No polydipsia. No polyuria.  Psych: Denies SI/HI  No problems updated.  ALLERGIES: Allergies  Allergen Reactions  . Shellfish Allergy Anaphylaxis and Swelling  . Diazepam Other (See Comments)    "felt like out of body experience"  . Morphine And Related Itching    PAST MEDICAL HISTORY: Past Medical History:  Diagnosis Date  . Asthma    As a child   . Bronchitis   . CAD (  coronary artery disease)    a. 09/2016: 50% Ost 1st Mrg stenosis, 50% 2nd Mrg stenosis, 20% Mid-Cx, 95% Prox LAD, 40% mid-LAD, and 10% dist-LAD stenosis. Staged PCI with DES to Prox-LAD.   [name] CHF (congestive heart failure) ([place]) 2011  . Complication of anesthesia   . Diabetes mellitus Dx 1989  . Hepatitis C Dx 2013  . Hypertension Dx 1989  . Obesity   . Pancreatitis  2013  . Refusal of blood transfusions as patient is Jehovah's Witness   . Ulcer 2010    MEDICATIONS AT HOME: Prior to Admission medications   Medication Sig Start Date End Date Taking? Authorizing Provider  acetaminophen-codeine (TYLENOL #3) 300-30 MG tablet Take 1 tablet by mouth every 8 (eight) hours as needed for moderate pain.   Yes [provider]  albuterol (PROVENTIL) (2.5 MG/3ML) 0.083% nebulizer solution Take 3 mLs (2.5 mg total) by nebulization every 6 (six) hours as needed for wheezing or shortness of breath. 12/17/16  Yes Argentina Donovan, PA-C  albuterol (VENTOLIN HFA) 108 (90 Base) MCG/ACT inhaler Inhale 2 puffs into the lungs every 4 (four) hours as needed for wheezing or shortness of breath. 08/03/16  Yes Funches, Josalyn, MD  aspirin EC 81 MG EC tablet Take 1 tablet (81 mg total) by mouth daily. 09/19/16  Yes Strader, [place], PA-C  atorvastatin (LIPITOR) 80 MG tablet Take 1 tablet (80 mg total) by mouth daily at 6 PM. 12/17/16  Yes Angeleena Dueitt M, PA-C  Blood Glucose Monitoring Suppl (TRUE METRIX METER) DEVI 1 each by Does not apply route 3 (three) times daily before meals. 10/07/16  Yes Arnoldo Morale, MD  carvedilol (COREG) 25 MG tablet TAKE 1 TABLET BY MOUTH 2 TIMES DAILY WITH A MEAL. 12/17/16  Yes Kanton Kamel M, PA-C  cyclobenzaprine (FLEXERIL) 10 MG tablet Take 1 tablet (10 mg total) by mouth 3 (three) times daily as needed for muscle spasms. 11/17/16  Yes Funches, Josalyn, MD  diclofenac sodium (VOLTAREN) 1 % GEL Apply 4 g topically daily as needed (for pain). 11/25/15  Yes Funches, Adriana Mccallum, MD  ferrous sulfate 325 (65 FE) MG tablet Take 1 tablet (325 mg total) by mouth 2 (two) times daily with a meal. 12/17/16  Yes Eldana Isip M, PA-C  fluticasone (FLONASE) 50 MCG/ACT nasal spray Place 2 sprays into both nostrils daily as needed for allergies or rhinitis.   Yes [provider]  Fluticasone-Salmeterol (ADVAIR DISKUS) 250-50 MCG/DOSE AEPB Inhale 1 puff into  the lungs 2 (two) times daily. 12/17/16  Yes Argentina Donovan, PA-C  furosemide (LASIX) 40 MG tablet Take 1 tablet (40 mg total) by mouth 2 (two) times daily. 12/17/16  Yes Freeman Caldron M, PA-C  glucose blood (TRUE METRIX BLOOD GLUCOSE TEST) test strip Use 3 times before meals 10/07/16  Yes Amao, Charlane Ferretti, MD  hydrALAZINE (APRESOLINE) 50 MG tablet Take 1 tablet (50 mg total) by mouth 3 (three) times daily. 12/17/16  Yes Argentina Donovan, PA-C  hydrOXYzine (ATARAX/VISTARIL) 50 MG tablet Take 1 tablet (50 mg total) by mouth 3 (three) times daily as needed. 12/17/16  Yes Shia Delaine M, PA-C  insulin aspart (NOVOLOG) 100 UNIT/ML FlexPen Inject 25 Units into the skin 3 (three) times daily with meals. 11/17/16  Yes Funches, Josalyn, MD  insulin aspart (NOVOLOG) 100 UNIT/ML injection Before each meal 3 times a day, 140-199 - 2 units, 200-250 - 4 units, 251-299 - 6 units,  300-349 - 8 units,  350 or above 10 units. Dispense  syringes and needles as needed, Ok to switch to PEN if approved. Substitute to any brand approved. DX DM2, Code E11.65 12/14/16  Yes Thurnell Lose, MD  Insulin Glargine (LANTUS SOLOSTAR) 100 UNIT/ML Solostar Pen Inject 35 Units into the skin 2 (two) times daily. 12/17/16  Yes Freeman Caldron M, PA-C  isosorbide mononitrate (IMDUR) 30 MG 24 hr tablet Take 1 tablet (30 mg total) by mouth daily. 12/17/16  Yes Freeman Caldron M, PA-C  ketotifen (ZADITOR) 0.025 % ophthalmic solution Place 1 drop into both eyes daily as needed (for dry eyes). 07/27/16  Yes Funches, Adriana Mccallum, MD  meclizine (ANTIVERT) 25 MG tablet Take 1 tablet (25 mg total) by mouth 3 (three) times daily as needed. 12/17/16  Yes Argentina Donovan, PA-C  pregabalin (LYRICA) 100 MG capsule Take 1 capsule (100 mg total) by mouth 3 (three) times daily. 12/17/16  Yes Sherril Heyward, Dionne Bucy, PA-C  ranitidine (ZANTAC) 150 MG tablet Take 1 tablet (150 mg total) by mouth 2 (two) times daily. 12/17/16  Yes Sharol Croghan, Dionne Bucy, PA-C  sitaGLIPtin (JANUVIA) 25 MG  tablet Take 1 tablet (25 mg total) by mouth daily. For PASS 12/17/16  Yes Argentina Donovan, PA-C  ticagrelor (BRILINTA) 90 MG TABS tablet Take 1 tablet (90 mg total) by mouth 2 (two) times daily. 12/17/16  Yes Latera Mclin, Dionne Bucy, PA-C  traMADol (ULTRAM) 50 MG tablet Take 1 tablet (50 mg total) by mouth every 6 (six) hours as needed for moderate pain. 09/19/16  Yes Strader, [place] M, PA-C  TRUEPLUS LANCETS 28G MISC 1 each by Does not apply route 3 (three) times daily before meals. 10/07/16  Yes Arnoldo Morale, MD  valACYclovir (VALTREX) 500 MG tablet Take 1 tablet (500 mg total) by mouth daily. 09/23/16  Yes Arnoldo Morale, MD  colchicine 0.6 MG tablet Take 1 tablet (0.6 mg total) by mouth daily. 12/17/16   Argentina Donovan, PA-C  Insulin Syringes, Disposable, U-100 0.5 ML MISC Inject insulin as directed 12/17/16   Argentina Donovan, PA-C     Objective:  EXAM:   Vitals:   12/17/16 1448  BP: 122/65  Pulse: 74  Resp: 18  Temp: 98.3 F (36.8 C)  TempSrc: Oral  SpO2: 98%  Weight: 299 lb (135.6 kg)  Height: 5\' 8"  (1.727 m)    General appearance : A&OX3. NAD. Non-toxic-appearing HEENT: Atraumatic and Normocephalic.  PERRLA. EOM intact.  Neck: supple, no JVD. No cervical lymphadenopathy. No thyromegaly Chest/Lungs:  Breathing-non-labored, Good air entry bilaterally, breath sounds normal without rales, rhonchi, or wheezing  CVS: S1 S2 regular, no murmurs, gallops, rubs  Extremities: Bilateral Lower Ext shows no edema, both legs are warm to touch with = pulse throughout Neurology:  CN II-XII grossly intact, Non focal.   Psych:  TP linear. J/I WNL. Normal speech. Appropriate eye contact and affect.  Skin:  No Rash  Data Review Lab Results  Component Value Date   HGBA1C 13.8 (H) 12/13/2016   HGBA1C 8.0 (H) 09/13/2016   HGBA1C 9.2 (H) 08/21/2016     Assessment & Plan   1. Essential hypertension Controlled. Continue current regimen - Basic metabolic panel - CBC with  Differential/Platelet   2. Uncontrolled type 2 diabetes mellitus with complication, with long-term current use of insulin ([place]) Uncontrolled with recent admission - Basic metabolic panel - Glucose (CBG) - Insulin Glargine (LANTUS SOLOSTAR) 100 UNIT/ML Solostar Pen; Inject 35 Units into the skin 2 (two) times daily.  Dispense: 15 mL; Refill: 0 - sitaGLIPtin (JANUVIA) 25  MG tablet; Take 1 tablet (25 mg total) by mouth daily. For PASS  Dispense: 90 tablet; Refill: 3 Check blood sugars 4 times daily and record and bring to next visit  3. Coronary artery disease involving native coronary artery of native heart with unstable angina pectoris (HCC) stable - ticagrelor (BRILINTA) 90 MG TABS tablet; Take 1 tablet (90 mg total) by mouth 2 (two) times daily.  Dispense: 180 tablet; Refill: 3 - atorvastatin (LIPITOR) 80 MG tablet; Take 1 tablet (80 mg total) by mouth daily at 6 PM.  Dispense: 30 tablet; Refill: 11 - hydrALAZINE (APRESOLINE) 50 MG tablet; Take 1 tablet (50 mg total) by mouth 3 (three) times daily.  Dispense: 90 tablet; Refill: 1   4. Uncomplicated asthma, unspecified asthma severity, unspecified whether persistent stable - Fluticasone-Salmeterol (ADVAIR DISKUS) 250-50 MCG/DOSE AEPB; Inhale 1 puff into the lungs 2 (two) times daily.  Dispense: 60 each; Refill: 3  5. Anemia in stage 3 chronic kidney disease - ferrous sulfate 325 (65 FE) MG tablet; Take 1 tablet (325 mg total) by mouth 2 (two) times daily with a meal.  Dispense: 60 tablet; Refill: 3  6. Environmental allergies - hydrOXYzine (ATARAX/VISTARIL) 50 MG tablet; Take 1 tablet (50 mg total) by mouth 3 (three) times daily as needed.  Dispense: 90 tablet; Refill: 1  7. Acute on chronic diastolic congestive heart failure, NYHA class 2 (HCC) - carvedilol (COREG) 25 MG tablet; TAKE 1 TABLET BY MOUTH 2 TIMES DAILY WITH A MEAL.  Dispense: 60 tablet; Refill: 2  8. Vertigo Ongoing/no changes - meclizine (ANTIVERT) 25 MG tablet; Take 1  tablet (25 mg total) by mouth 3 (three) times daily as needed.  Dispense: 60 tablet; Refill: 2  9. Gastroesophageal reflux disease without esophagitis ranitidine (ZANTAC) 150 MG tablet; Take 1 tablet (150 mg total) by mouth 2 (two) times daily.  Dispense: 60 tablet; Refill: 3  10. Chronic pain syndrome - Ambulatory referral to Pain Clinic  Patient have been counseled extensively about nutrition and exercise  Return in about 2 weeks (around 12/31/2016) for Dr Adrian Blackwater; DM and multiple medications.  The patient was given clear instructions to go to ER or return to medical center if symptoms don't improve, worsen or new problems develop. The patient verbalized understanding. The patient was told to call to get lab results if they haven't heard anything in the next week.   Freeman Caldron, PA-C [facility] and [facility] [place], [place]   12/17/2016, 3:21 PM

## 2016-12-18 MED FILL — JANUVIA 25 MG TABLET: 25 | 10 days supply | Qty: 10 | Fill #0

## 2016-12-18 MED FILL — **BRILINTA 90 MG TABLET: 90 | 30 days supply | Qty: 60 | Fill #0

## 2016-12-21 ENCOUNTER — Other Ambulatory Visit: Payer: Self-pay | Admitting: Family Medicine

## 2016-12-21 DIAGNOSIS — A6000 Herpesviral infection of urogenital system, unspecified: Secondary | ICD-10-CM

## 2016-12-21 MED FILL — AMLODIPINE BESYLATE 5 MG TA: 5 | 30 days supply | Qty: 30 | Fill #3

## 2016-12-23 MED FILL — VALACYCLOVIR HCL 500 MG TAB: 500 | 30 days supply | Qty: 30 | Fill #0

## 2016-12-25 MED FILL — $LANTUS SOLOSTAR 100 UNITS/: 100 | 30 days supply | Qty: 18 | Fill #1

## 2016-12-25 MED FILL — $novoLOG 100 UNITS/ML VIAL: 100 | 26 days supply | Qty: 20 | Fill #0

## 2016-12-28 ENCOUNTER — Other Ambulatory Visit: Payer: Self-pay

## 2016-12-28 DIAGNOSIS — I5043 Acute on chronic combined systolic (congestive) and diastolic (congestive) heart failure: Secondary | ICD-10-CM

## 2017-01-06 NOTE — Progress Notes (Signed)
Subjective:  Patient ID: [name], female    DOB: Jun 18, 1956  Age: 61 y.o. MRN: [id number]  CC: Diabetes   HPI [name] has asthma, OSA, uncontrolled insulin dependent  Diabetes type 2, HTN, CAD, recent NSTEMI s/p stent placement, Hep C,  CKD stage 3 mixed systolic and diastolic CHF, obesity she presents for    1. Hospitalization  follow up acute on chronic CKD: she was seen in the ED on  12/12/2016 for dizziness, polyuria and polydipsia. She was found to have hyperglycemia, metabolic acidosis, hyponatremia and acute on chronic CKD. Symptoms improved with IV fluids, IV insulin and her lasix dose was held. She has been referred to outpatient nephrology but referral has been delayed due to lack of insurance coverage.   2. CHRONIC DIABETES with microabuminuria, CKD stage 4, not on ace inhibitor due to hyperkalemia   Disease Monitoring  Blood Sugar Ranges:   Fasting: in 200s   Postprandial: 100s-200s  Polyuria: no   Visual problems: no  Medication Compliance: does not take novolog TID   Medication Side Effects  Hypoglycemia: no   3. Memory loss: for about 6 months. Reports forgetting where she is going. She forgets to take her midday medications. She notices losing her train of thought while talking. She is tried during the day. She has known sleep apnea. She also reports having vivid dreams about 3 months ago. Dreams are about people who have passed away. She is concerned about why is she dreaming about this so often. She does admit to feeling worried about her health but does not let it depress her. She also notes her body jerking.   Social History  Substance Use Topics  . Smoking status: Former Smoker    Quit date: 10/25/1980  . Smokeless tobacco: Never Used     Comment: quit smoking in 1982  . Alcohol use Yes     Comment: 3 times in last year    Outpatient Medications Prior to Visit  Medication Sig Dispense Refill  . acetaminophen-codeine (TYLENOL #3) 300-30 MG tablet  Take 1 tablet by mouth every 8 (eight) hours as needed for moderate pain.    [name] albuterol (PROVENTIL) (2.5 MG/3ML) 0.083% nebulizer solution Take 3 mLs (2.5 mg total) by nebulization every 6 (six) hours as needed for wheezing or shortness of breath. 150 mL 1  . albuterol (VENTOLIN HFA) 108 (90 Base) MCG/ACT inhaler Inhale 2 puffs into the lungs every 4 (four) hours as needed for wheezing or shortness of breath. 54 g 3  . aspirin EC 81 MG EC tablet Take 1 tablet (81 mg total) by mouth daily.    [name] atorvastatin (LIPITOR) 80 MG tablet Take 1 tablet (80 mg total) by mouth daily at 6 PM. 30 tablet 11  . Blood Glucose Monitoring Suppl (TRUE METRIX METER) DEVI 1 each by Does not apply route 3 (three) times daily before meals. 1 Device 0  . carvedilol (COREG) 25 MG tablet TAKE 1 TABLET BY MOUTH 2 TIMES DAILY WITH A MEAL. 60 tablet 2  . colchicine 0.6 MG tablet Take 1 tablet (0.6 mg total) by mouth daily. 30 tablet 2  . cyclobenzaprine (FLEXERIL) 10 MG tablet Take 1 tablet (10 mg total) by mouth 3 (three) times daily as needed for muscle spasms. 30 tablet 0  . diclofenac sodium (VOLTAREN) 1 % GEL Apply 4 g topically daily as needed (for pain). 100 g 2  . ferrous sulfate 325 (65 FE) MG tablet Take 1 tablet (  325 mg total) by mouth 2 (two) times daily with a meal. 60 tablet 3  . fluticasone (FLONASE) 50 MCG/ACT nasal spray Place 2 sprays into both nostrils daily as needed for allergies or rhinitis.    . Fluticasone-Salmeterol (ADVAIR DISKUS) 250-50 MCG/DOSE AEPB Inhale 1 puff into the lungs 2 (two) times daily. 60 each 3  . furosemide (LASIX) 40 MG tablet Take 1 tablet (40 mg total) by mouth 2 (two) times daily. 60 tablet 1  . glucose blood (TRUE METRIX BLOOD GLUCOSE TEST) test strip Use 3 times before meals 100 each 12  . hydrALAZINE (APRESOLINE) 50 MG tablet Take 1 tablet (50 mg total) by mouth 3 (three) times daily. 90 tablet 1  . hydrOXYzine (ATARAX/VISTARIL) 50 MG tablet Take 1 tablet (50 mg total) by mouth 3  (three) times daily as needed. 90 tablet 1  . insulin aspart (NOVOLOG) 100 UNIT/ML FlexPen Inject 25 Units into the skin 3 (three) times daily with meals. 30 mL 3  . insulin aspart (NOVOLOG) 100 UNIT/ML injection Before each meal 3 times a day, 140-199 - 2 units, 200-250 - 4 units, 251-299 - 6 units,  300-349 - 8 units,  350 or above 10 units. Dispense syringes and needles as needed, Ok to switch to PEN if approved. Substitute to any brand approved. DX DM2, Code E11.65 1 vial 0  . Insulin Glargine (LANTUS SOLOSTAR) 100 UNIT/ML Solostar Pen Inject 35 Units into the skin 2 (two) times daily. 15 mL 2  . Insulin Syringes, Disposable, U-100 0.5 ML MISC Inject insulin as directed 100 each 0  . isosorbide mononitrate (IMDUR) 30 MG 24 hr tablet Take 1 tablet (30 mg total) by mouth daily. 30 tablet 3  . ketotifen (ZADITOR) 0.025 % ophthalmic solution Place 1 drop into both eyes daily as needed (for dry eyes). 10 mL 2  . meclizine (ANTIVERT) 25 MG tablet Take 1 tablet (25 mg total) by mouth 3 (three) times daily as needed. 60 tablet 2  . pregabalin (LYRICA) 100 MG capsule Take 1 capsule (100 mg total) by mouth 3 (three) times daily. 90 capsule 1  . ranitidine (ZANTAC) 150 MG tablet Take 1 tablet (150 mg total) by mouth 2 (two) times daily. 60 tablet 3  . sitaGLIPtin (JANUVIA) 25 MG tablet Take 1 tablet (25 mg total) by mouth daily. For PASS 90 tablet 3  . ticagrelor (BRILINTA) 90 MG TABS tablet Take 1 tablet (90 mg total) by mouth 2 (two) times daily. 180 tablet 3  . traMADol (ULTRAM) 50 MG tablet Take 1 tablet (50 mg total) by mouth every 6 (six) hours as needed for moderate pain. 10 tablet 0  . TRUEPLUS LANCETS 28G MISC 1 each by Does not apply route 3 (three) times daily before meals. 100 each 12  . valACYclovir (VALTREX) 500 MG tablet TAKE 1 TABLET BY MOUTH DAILY. 30 tablet 0   No facility-administered medications prior to visit.     ROS Review of Systems  Constitutional: Positive for fatigue.  Negative for chills and fever.  Eyes: Negative for visual disturbance.  Respiratory: Positive for shortness of breath.   Cardiovascular: Positive for leg swelling. Negative for chest pain.  Gastrointestinal: Negative for blood in stool.  Genitourinary: Negative for dysuria.  Musculoskeletal: Positive for arthralgias (R hand ). Negative for back pain.  Skin: Negative for rash.  Allergic/Immunologic: Negative for immunocompromised state.  Neurological:       Forgetfulness  Hematological: Negative for adenopathy. Does not bruise/bleed easily.  Psychiatric/Behavioral:  Negative for dysphoric mood and suicidal ideas.    Objective:  BP (!) 145/73   Pulse 77   Temp 98.2 F (36.8 C) (Oral)   Wt 297 lb 12.8 oz (135.1 kg)   SpO2 96%   BMI 45.28 kg/m   BP/Weight 01/07/2017 03/18/453 0/03/8118  Systolic BP 147 829 562  Diastolic BP 73 65 81  Wt. (Lbs) 297.8 299 298.94  BMI 45.28 45.46 45.45   Wt Readings from Last 3 Encounters:  12/17/16 299 lb (135.6 kg)  12/14/16 298 lb 15.1 oz (135.6 kg)  11/17/16 (!) 301 lb 3.2 oz (136.6 kg)     Physical Exam  Constitutional: She is oriented to person, place, and time. She appears well-developed and well-nourished. No distress.  Obese   HENT:  Head: Normocephalic and atraumatic.  Cardiovascular: Normal rate, regular rhythm, normal heart sounds and intact distal pulses.   Pulmonary/Chest: Effort normal and breath sounds normal.  Abdominal: Soft. Bowel sounds are normal.  Musculoskeletal: She exhibits edema (trace).  Neurological: She is alert and oriented to person, place, and time.  3 item recall intact   Skin: Skin is warm and dry. No rash noted.  Psychiatric: She has a normal mood and affect.   Lab Results  Component Value Date   HGBA1C 13.8 (H) 12/13/2016   Lab Results  Component Value Date   HGBA1C 12.2 01/07/2017   CBG 276    Component Value Date/Time   NA 129 (L) 12/14/2016 0244   NA 143 11/17/2016 1632   K 4.8 12/14/2016  0244   CL 101 12/14/2016 0244   CO2 18 (L) 12/14/2016 0244   BUN 88 (H) 12/14/2016 0244   BUN 52 (H) 11/17/2016 1632   CREATININE 2.91 (H) 12/14/2016 0244   CREATININE 1.36 (H) 09/23/2016 1451      Component Value Date/Time   CALCIUM 8.3 (L) 12/14/2016 0244   ALKPHOS 185 (H) 11/17/2016 1632   AST 28 11/17/2016 1632   ALT 31 11/17/2016 1632   BILITOT 0.3 11/17/2016 1632      Assessment & Plan:  [name] was seen today for diabetes.  Diagnoses and all orders for this visit:  Chronic diastolic CHF (congestive heart failure), NYHA class 2 (HCC) -     BMP8+EGFR -     DG Chest 2 View; Future  CKD (chronic kidney disease) stage 3, GFR 30-59 ml/min -     CBC -     BMP8+EGFR  OSA (obstructive sleep apnea)  Uncontrolled type 2 diabetes mellitus with complication, with long-term current use of insulin (HCC) -     POCT glucose (manual entry) -     POCT glycosylated hemoglobin (Hb A1C) -     insulin aspart (NOVOLOG) 100 UNIT/ML injection; Before each meal 3 times a day, 140-199 - 2 units, 200-250 - 4 units, 251-299 - 6 units,  300-349 - 8 units,  350 or above 10 units. Dispense syringes and needles as needed, Ok to switch to PEN if approved. Substitute to any brand approved. DX DM2, Code E11.65 -     lisinopril (PRINIVIL,ZESTRIL) 10 MG tablet; Take 1 tablet (10 mg total) by mouth daily.  Essential hypertension -     lisinopril (PRINIVIL,ZESTRIL) 10 MG tablet; Take 1 tablet (10 mg total) by mouth daily.  Acute idiopathic gout of right hand -     acetaminophen-codeine (TYLENOL #3) 300-30 MG tablet; Take 1 tablet by mouth every 8 (eight) hours as needed for moderate pain. -  Uric acid  Other orders -     fluticasone (FLONASE) 50 MCG/ACT nasal spray; Place 2 sprays into both nostrils daily as needed for allergies or rhinitis.   There are no diagnoses linked to this encounter.  No orders of the defined types were placed in this encounter.   Follow-up: Return in about 2 weeks  (around 01/21/2017) for check of BP and BMP.   Boykin Nearing MD

## 2017-01-07 ENCOUNTER — Ambulatory Visit: Payer: Medicaid Other | Attending: Family Medicine | Admitting: Family Medicine

## 2017-01-07 ENCOUNTER — Encounter: Payer: Self-pay | Admitting: Family Medicine

## 2017-01-07 VITALS — BP 145/73 | HR 77 | Temp 98.2°F | Wt 297.8 lb

## 2017-01-07 DIAGNOSIS — Z6841 Body Mass Index (BMI) 40.0 and over, adult: Secondary | ICD-10-CM | POA: Diagnosis not present

## 2017-01-07 DIAGNOSIS — Z87891 Personal history of nicotine dependence: Secondary | ICD-10-CM | POA: Diagnosis not present

## 2017-01-07 DIAGNOSIS — M1000 Idiopathic gout, unspecified site: Secondary | ICD-10-CM | POA: Insufficient documentation

## 2017-01-07 DIAGNOSIS — I5032 Chronic diastolic (congestive) heart failure: Secondary | ICD-10-CM | POA: Diagnosis not present

## 2017-01-07 DIAGNOSIS — I252 Old myocardial infarction: Secondary | ICD-10-CM | POA: Diagnosis not present

## 2017-01-07 DIAGNOSIS — N183 Chronic kidney disease, stage 3 unspecified: Secondary | ICD-10-CM

## 2017-01-07 DIAGNOSIS — E871 Hypo-osmolality and hyponatremia: Secondary | ICD-10-CM | POA: Diagnosis not present

## 2017-01-07 DIAGNOSIS — E1122 Type 2 diabetes mellitus with diabetic chronic kidney disease: Secondary | ICD-10-CM | POA: Diagnosis present

## 2017-01-07 DIAGNOSIS — E669 Obesity, unspecified: Secondary | ICD-10-CM | POA: Diagnosis not present

## 2017-01-07 DIAGNOSIS — I10 Essential (primary) hypertension: Secondary | ICD-10-CM

## 2017-01-07 DIAGNOSIS — M10041 Idiopathic gout, right hand: Secondary | ICD-10-CM

## 2017-01-07 DIAGNOSIS — E1165 Type 2 diabetes mellitus with hyperglycemia: Secondary | ICD-10-CM | POA: Insufficient documentation

## 2017-01-07 DIAGNOSIS — Z7982 Long term (current) use of aspirin: Secondary | ICD-10-CM | POA: Diagnosis not present

## 2017-01-07 DIAGNOSIS — J45909 Unspecified asthma, uncomplicated: Secondary | ICD-10-CM | POA: Insufficient documentation

## 2017-01-07 DIAGNOSIS — E118 Type 2 diabetes mellitus with unspecified complications: Secondary | ICD-10-CM

## 2017-01-07 DIAGNOSIS — I2510 Atherosclerotic heart disease of native coronary artery without angina pectoris: Secondary | ICD-10-CM | POA: Insufficient documentation

## 2017-01-07 DIAGNOSIS — I130 Hypertensive heart and chronic kidney disease with heart failure and stage 1 through stage 4 chronic kidney disease, or unspecified chronic kidney disease: Secondary | ICD-10-CM | POA: Insufficient documentation

## 2017-01-07 DIAGNOSIS — E875 Hyperkalemia: Secondary | ICD-10-CM | POA: Insufficient documentation

## 2017-01-07 DIAGNOSIS — Z794 Long term (current) use of insulin: Secondary | ICD-10-CM | POA: Insufficient documentation

## 2017-01-07 DIAGNOSIS — N184 Chronic kidney disease, stage 4 (severe): Secondary | ICD-10-CM | POA: Diagnosis not present

## 2017-01-07 DIAGNOSIS — G4733 Obstructive sleep apnea (adult) (pediatric): Secondary | ICD-10-CM

## 2017-01-07 DIAGNOSIS — IMO0002 Reserved for concepts with insufficient information to code with codable children: Secondary | ICD-10-CM

## 2017-01-07 DIAGNOSIS — E872 Acidosis: Secondary | ICD-10-CM | POA: Diagnosis not present

## 2017-01-07 DIAGNOSIS — R413 Other amnesia: Secondary | ICD-10-CM | POA: Diagnosis not present

## 2017-01-07 LAB — GLUCOSE, POCT (MANUAL RESULT ENTRY): POC Glucose: 276 mg/dL — AB (ref 70–99)

## 2017-01-07 LAB — POCT GLYCOSYLATED HEMOGLOBIN (HGB A1C): Hemoglobin A1C: 12.2

## 2017-01-07 MED ORDER — ACETAMINOPHEN-CODEINE #3 300-30 MG PO TABS: 1.0000 | ORAL_TABLET | Freq: Three times a day (TID) | ORAL | 2 refills | Status: DC | PRN

## 2017-01-07 MED ORDER — INSULIN ASPART 100 UNIT/ML ~~LOC~~ SOLN: SUBCUTANEOUS | 2 refills | Status: DC

## 2017-01-07 MED ORDER — FLUTICASONE PROPIONATE 50 MCG/ACT NA SUSP: 2.0000 | Freq: Every day | NASAL | 5 refills | Status: DC | PRN

## 2017-01-07 MED ORDER — LISINOPRIL 10 MG PO TABS: 10.0000 mg | ORAL_TABLET | Freq: Every day | ORAL | 1 refills | Status: DC

## 2017-01-07 NOTE — Patient Instructions (Addendum)
[name] was seen today for diabetes.  Diagnoses and all orders for this visit:  Chronic diastolic CHF (congestive heart failure), NYHA class 2 (HCC) -     BMP8+EGFR -     DG Chest 2 View; Future  CKD (chronic kidney disease) stage 3, GFR 30-59 ml/min -     CBC -     BMP8+EGFR  OSA (obstructive sleep apnea)  Uncontrolled type 2 diabetes mellitus with complication, with long-term current use of insulin (HCC) -     POCT glucose (manual entry) -     POCT glycosylated hemoglobin (Hb A1C) -     insulin aspart (NOVOLOG) 100 UNIT/ML injection; Before each meal 3 times a day, 140-199 - 2 units, 200-250 - 4 units, 251-299 - 6 units,  300-349 - 8 units,  350 or above 10 units. Dispense syringes and needles as needed, Ok to switch to PEN if approved. Substitute to any brand approved. DX DM2, Code E11.65 -     lisinopril (PRINIVIL,ZESTRIL) 10 MG tablet; Take 1 tablet (10 mg total) by mouth daily.  Essential hypertension -     lisinopril (PRINIVIL,ZESTRIL) 10 MG tablet; Take 1 tablet (10 mg total) by mouth daily.  Acute idiopathic gout of right hand -     acetaminophen-codeine (TYLENOL #3) 300-30 MG tablet; Take 1 tablet by mouth every 8 (eight) hours as needed for moderate pain. -     Uric acid  Other orders -     fluticasone (FLONASE) 50 MCG/ACT nasal spray; Place 2 sprays into both nostrils daily as needed for allergies or rhinitis.   Avoid Rolaids, but you can take Tums which is just calcium carbonate  Set alarms for your doses of medications  I will work on your CPAP  F/u in 2 weeks for BP and BMP recheck since starting lisinopril  Dr.   

## 2017-01-07 NOTE — Assessment & Plan Note (Signed)
Repeat BMP today Adding lisinopril 10 mg daily Repeat and BP check in 2 weeks

## 2017-01-07 NOTE — Assessment & Plan Note (Signed)
A: mildly hypertensive in setting of uncontrolled diabetes and CKD P: Add back lisinopril at reduced does of 10 mg daily due to hyperkalemia on 40 mg daily dose

## 2017-01-07 NOTE — Assessment & Plan Note (Signed)
Improving but still uncontrolled with complications Med: non compliant P: Continue current regimen Advised she set reminder on her phone to eat and take medication

## 2017-01-07 NOTE — Assessment & Plan Note (Signed)
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

## 2017-01-08 LAB — CBC
Hematocrit: 34.5 % (ref 34.0–46.6)
Hemoglobin: 10.9 g/dL — ABNORMAL LOW (ref 11.1–15.9)
MCH: 27 pg (ref 26.6–33.0)
MCHC: 31.6 g/dL (ref 31.5–35.7)
MCV: 86 fL (ref 79–97)
Platelets: 236 10*3/uL (ref 150–379)
RBC: 4.03 x10E6/uL (ref 3.77–5.28)
RDW: 15.9 % — ABNORMAL HIGH (ref 12.3–15.4)
WBC: 5.1 10*3/uL (ref 3.4–10.8)

## 2017-01-08 LAB — BMP8+EGFR
BUN/Creatinine Ratio: 29 — ABNORMAL HIGH (ref 9–23)
BUN: 59 mg/dL — ABNORMAL HIGH (ref 6–24)
CO2: 20 mmol/L (ref 20–29)
Calcium: 8.8 mg/dL (ref 8.7–10.2)
Chloride: 104 mmol/L (ref 96–106)
Creatinine, Ser: 2.02 mg/dL — ABNORMAL HIGH (ref 0.57–1.00)
GFR calc Af Amer: 31 mL/min/{1.73_m2} — ABNORMAL LOW (ref >59)
GFR calc non Af Amer: 27 mL/min/{1.73_m2} — ABNORMAL LOW (ref >59)
Glucose: 324 mg/dL — ABNORMAL HIGH (ref 65–99)
Potassium: 4.1 mmol/L (ref 3.5–5.2)
Sodium: 142 mmol/L (ref 134–144)

## 2017-01-14 ENCOUNTER — Telehealth: Payer: Self-pay

## 2017-01-14 NOTE — Telephone Encounter (Signed)
Pt was called and informed of lab results. 

## 2017-01-15 MED FILL — GABAPENTIN 100 MG CAPSULE: 100 | 30 days supply | Qty: 180 | Fill #1

## 2017-01-15 MED FILL — FERROUS SULFATE 325 MG TAB: 325 (65 FE) | 30 days supply | Qty: 60 | Fill #1

## 2017-01-15 MED FILL — ?CARVEDILOL 25 MG TABLET: 25 | 30 days supply | Qty: 60 | Fill #1

## 2017-01-15 MED FILL — hydrALAZINE HCL 50 MG TABS: 50 | 30 days supply | Qty: 90 | Fill #1

## 2017-01-15 MED FILL — ISOSORBIDE MN ER 30 MG TAB: 30 | 30 days supply | Qty: 30 | Fill #1

## 2017-01-15 MED FILL — raNITIdine HCL 150 MG TABS: 150 | 30 days supply | Qty: 60 | Fill #1

## 2017-01-15 MED FILL — COLCHICINE 0.6 MG TABLET: 0.6 | 30 days supply | Qty: 30 | Fill #1

## 2017-01-15 MED FILL — FUROSEMIDE 40 MG TABLET: 40 | 30 days supply | Qty: 60 | Fill #1

## 2017-01-15 MED FILL — AMLODIPINE BESYLATE 5 MG TA: 5 | 30 days supply | Qty: 30 | Fill #4

## 2017-01-19 IMAGING — CR DG CHEST 2V
2 series · 2 of 2 positions shown · non-contrast
Comparison: 03/24/2012

CLINICAL DATA: Left chest pain, wheezing, dyspnea

EXAM:
CHEST  2 VIEW

[chest pa]
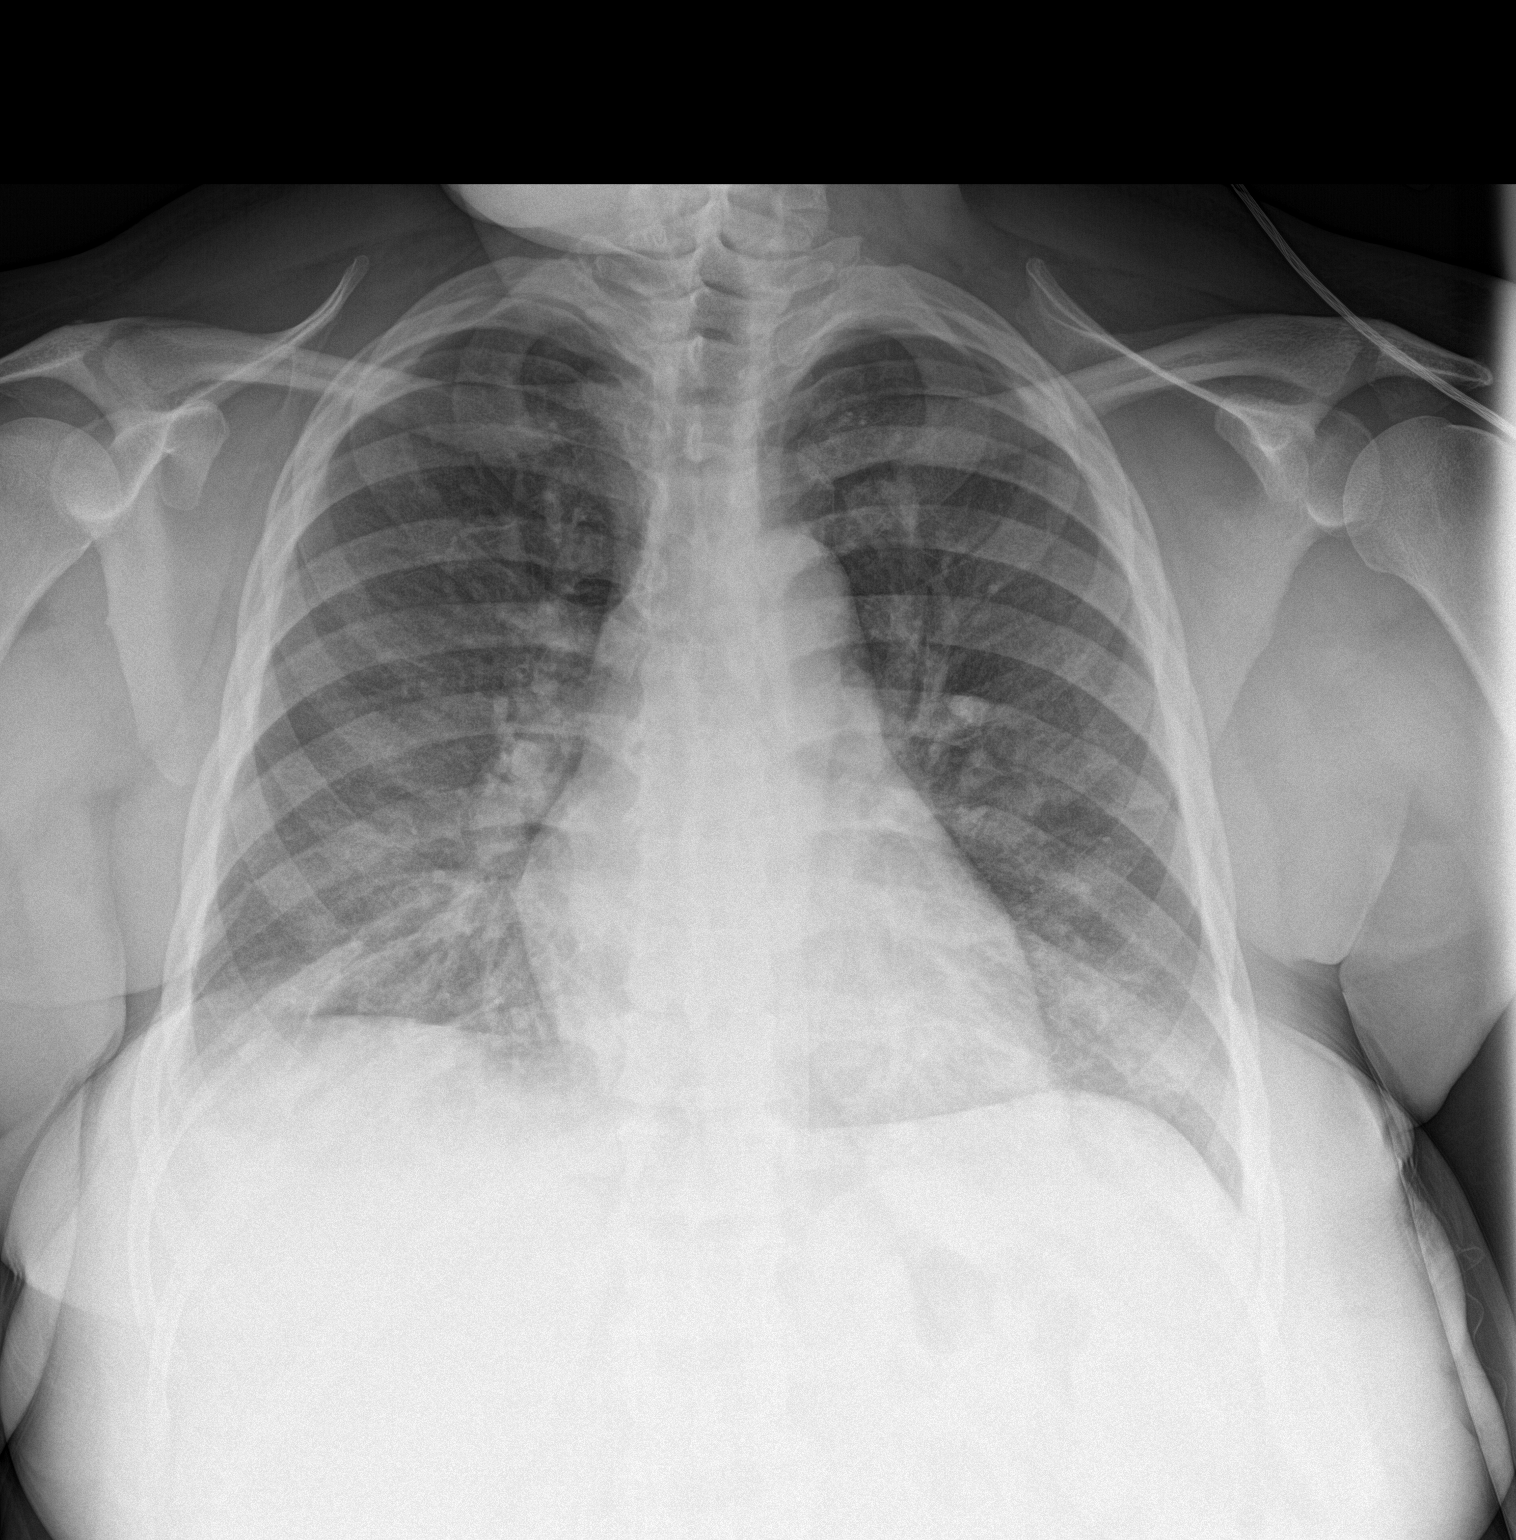

[chest lat]
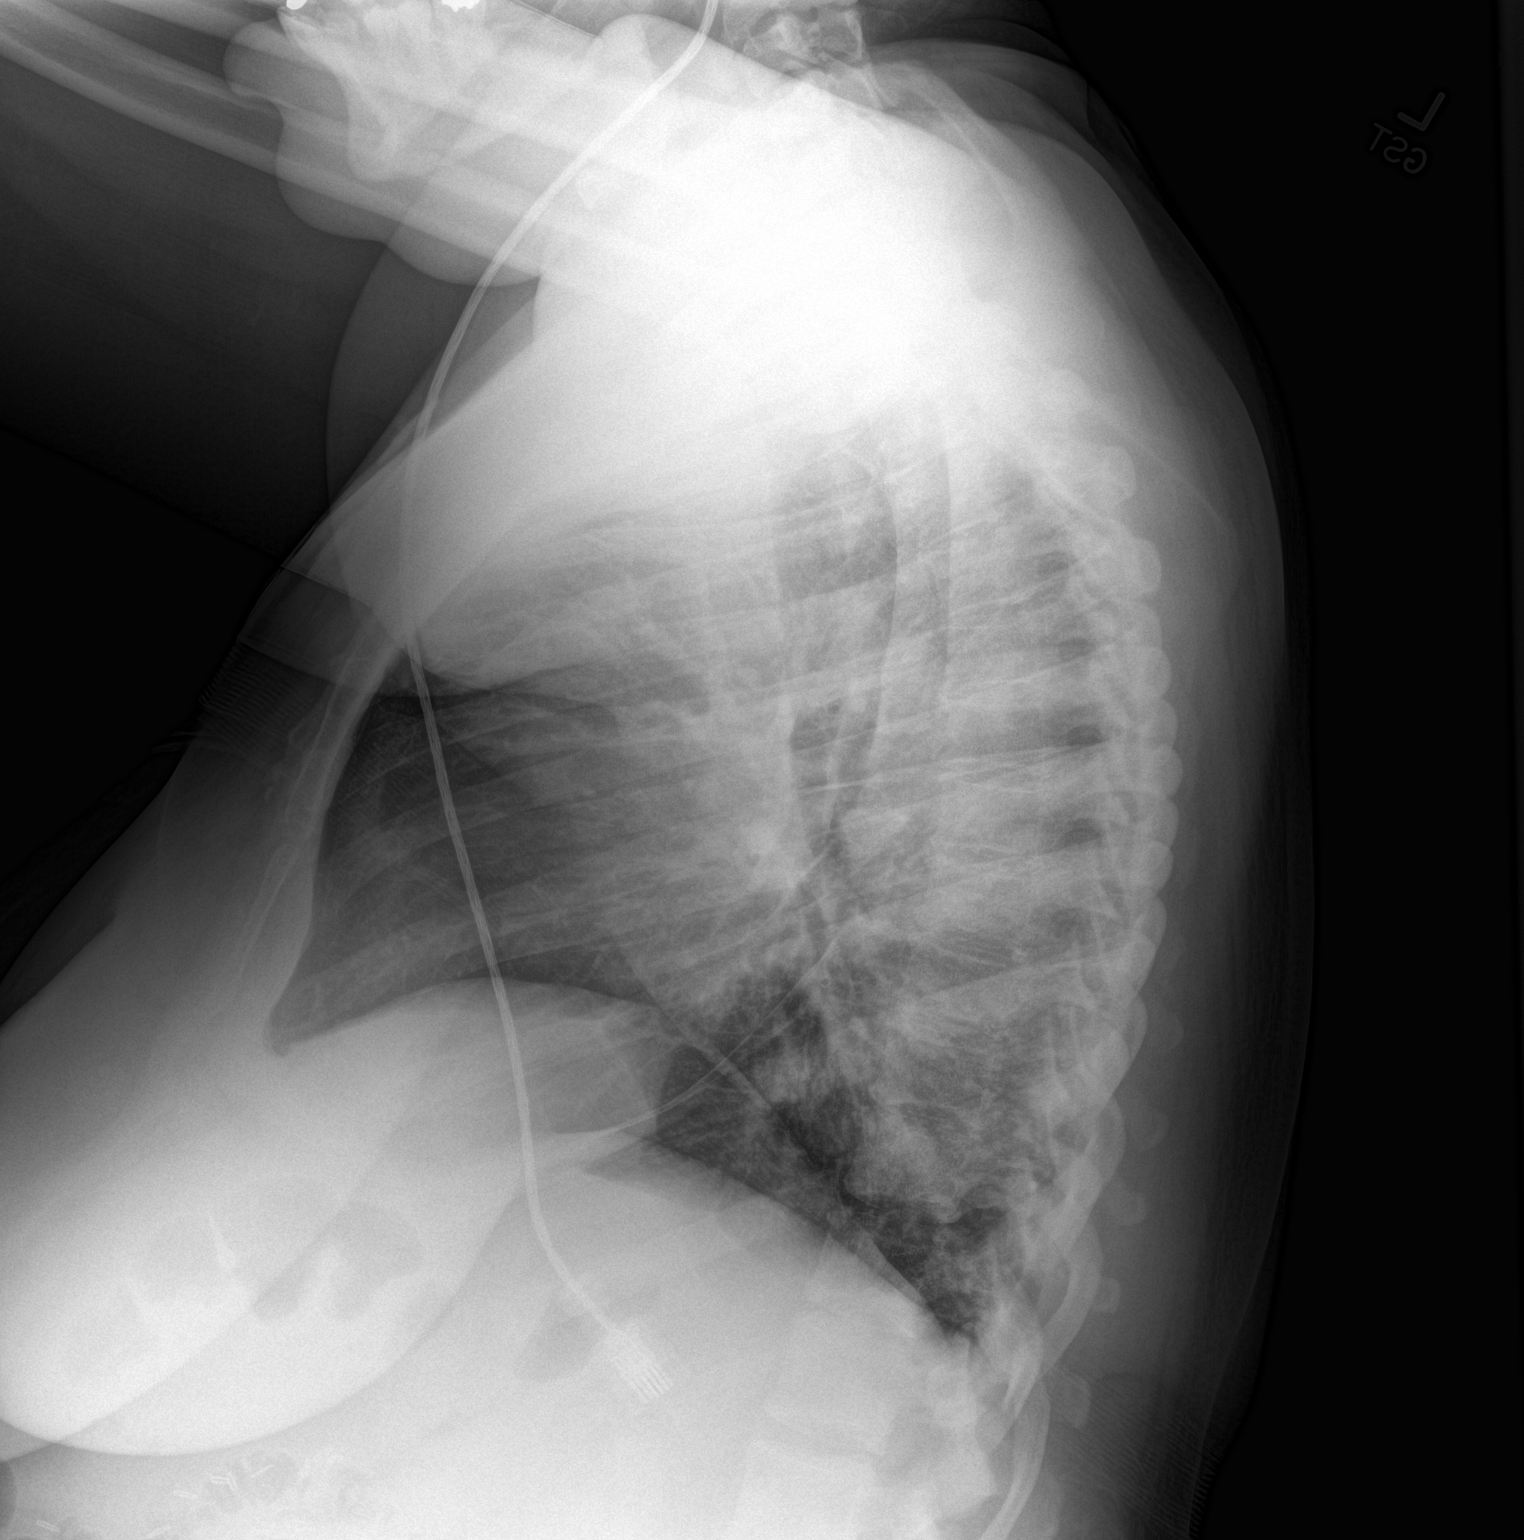

[2 of 2 positions shown; findings below may reference images not displayed]

FINDINGS: Mild basilar airspace opacities are present, infectious versus
atelectatic no large effusions. Heart size is normal and unchanged.
Pulmonary vasculature is normal.
IMPRESSION: Mild basilar infiltrate or atelectasis

## 2017-01-22 ENCOUNTER — Encounter: Payer: Self-pay | Admitting: Family Medicine

## 2017-01-22 ENCOUNTER — Ambulatory Visit: Payer: Medicaid Other | Attending: Family Medicine | Admitting: Family Medicine

## 2017-01-22 ENCOUNTER — Telehealth: Payer: Self-pay

## 2017-01-22 VITALS — BP 164/83 | HR 81 | Temp 98.0°F | Ht 68.0 in | Wt 302.6 lb

## 2017-01-22 DIAGNOSIS — IMO0002 Reserved for concepts with insufficient information to code with codable children: Secondary | ICD-10-CM

## 2017-01-22 DIAGNOSIS — E1122 Type 2 diabetes mellitus with diabetic chronic kidney disease: Secondary | ICD-10-CM | POA: Diagnosis not present

## 2017-01-22 DIAGNOSIS — J45909 Unspecified asthma, uncomplicated: Secondary | ICD-10-CM | POA: Diagnosis not present

## 2017-01-22 DIAGNOSIS — N183 Chronic kidney disease, stage 3 unspecified: Secondary | ICD-10-CM

## 2017-01-22 DIAGNOSIS — I130 Hypertensive heart and chronic kidney disease with heart failure and stage 1 through stage 4 chronic kidney disease, or unspecified chronic kidney disease: Secondary | ICD-10-CM | POA: Diagnosis present

## 2017-01-22 DIAGNOSIS — I5032 Chronic diastolic (congestive) heart failure: Secondary | ICD-10-CM | POA: Diagnosis not present

## 2017-01-22 DIAGNOSIS — E669 Obesity, unspecified: Secondary | ICD-10-CM | POA: Insufficient documentation

## 2017-01-22 DIAGNOSIS — Z6841 Body Mass Index (BMI) 40.0 and over, adult: Secondary | ICD-10-CM | POA: Insufficient documentation

## 2017-01-22 DIAGNOSIS — Z7982 Long term (current) use of aspirin: Secondary | ICD-10-CM | POA: Insufficient documentation

## 2017-01-22 DIAGNOSIS — M7989 Other specified soft tissue disorders: Secondary | ICD-10-CM | POA: Insufficient documentation

## 2017-01-22 DIAGNOSIS — E1165 Type 2 diabetes mellitus with hyperglycemia: Secondary | ICD-10-CM | POA: Insufficient documentation

## 2017-01-22 DIAGNOSIS — M79642 Pain in left hand: Secondary | ICD-10-CM | POA: Diagnosis not present

## 2017-01-22 DIAGNOSIS — Z794 Long term (current) use of insulin: Secondary | ICD-10-CM

## 2017-01-22 DIAGNOSIS — Z87891 Personal history of nicotine dependence: Secondary | ICD-10-CM | POA: Diagnosis not present

## 2017-01-22 DIAGNOSIS — E875 Hyperkalemia: Secondary | ICD-10-CM | POA: Diagnosis not present

## 2017-01-22 DIAGNOSIS — N184 Chronic kidney disease, stage 4 (severe): Secondary | ICD-10-CM | POA: Insufficient documentation

## 2017-01-22 DIAGNOSIS — G4733 Obstructive sleep apnea (adult) (pediatric): Secondary | ICD-10-CM

## 2017-01-22 DIAGNOSIS — E118 Type 2 diabetes mellitus with unspecified complications: Secondary | ICD-10-CM

## 2017-01-22 DIAGNOSIS — I252 Old myocardial infarction: Secondary | ICD-10-CM | POA: Insufficient documentation

## 2017-01-22 DIAGNOSIS — I2510 Atherosclerotic heart disease of native coronary artery without angina pectoris: Secondary | ICD-10-CM | POA: Diagnosis not present

## 2017-01-22 DIAGNOSIS — I10 Essential (primary) hypertension: Secondary | ICD-10-CM

## 2017-01-22 DIAGNOSIS — M1990 Unspecified osteoarthritis, unspecified site: Secondary | ICD-10-CM | POA: Insufficient documentation

## 2017-01-22 LAB — GLUCOSE, POCT (MANUAL RESULT ENTRY): POC Glucose: 298 mg/dL — AB (ref 70–99)

## 2017-01-22 MED ORDER — INSULIN ASPART 100 UNIT/ML ~~LOC~~ SOLN: 25.0000 [IU] | Freq: Three times a day (TID) | SUBCUTANEOUS | 5 refills | Status: DC

## 2017-01-22 MED ORDER — INSULIN ASPART 100 UNIT/ML FLEXPEN: 25.0000 [IU] | PEN_INJECTOR | Freq: Three times a day (TID) | SUBCUTANEOUS | 3 refills | Status: DC

## 2017-01-22 MED ORDER — INSULIN GLARGINE 100 UNIT/ML SOLOSTAR PEN: 40.0000 [IU] | PEN_INJECTOR | Freq: Two times a day (BID) | SUBCUTANEOUS | 2 refills | Status: DC

## 2017-01-22 MED ORDER — METHYLPREDNISOLONE ACETATE 80 MG/ML IJ SUSP
80.0000 mg | Freq: Once | INTRAMUSCULAR | Status: AC
  Administered 2017-01-22: 80 mg via INTRAMUSCULAR

## 2017-01-22 MED FILL — ?LISINOPRIL 10 MG TABLET: 10 | 30 days supply | Qty: 30 | Fill #0

## 2017-01-22 MED FILL — $novoLOG 100 UNITS/ML VIAL: 100 | 40 days supply | Qty: 30 | Fill #0

## 2017-01-22 MED FILL — $LANTUS SOLOSTAR 100 UNITS/: 100 | 30 days supply | Qty: 24 | Fill #0

## 2017-01-22 NOTE — Telephone Encounter (Signed)
Application for CPAP faxed to [employer] - CPAP assistance program. Fax # [phone].

## 2017-01-22 NOTE — Assessment & Plan Note (Signed)
A: hypertensive today Med: restarting lisinopril 10 mg daily P: F/u in 2 weeks for recheck of BP and labs

## 2017-01-22 NOTE — Assessment & Plan Note (Signed)
Improving but still with hyperglycemia Increase lantus to 40 U BID Continue novolog 25 U TID

## 2017-01-22 NOTE — Progress Notes (Signed)
Subjective:  Patient ID: [name], female    DOB: 1956-05-02  Age: 61 y.o. MRN: [id number]  CC: Hypertension and Diabetes   HPI Noor M Audia has asthma, OSA, uncontrolled insulin dependent  Diabetes type 2, HTN, CAD, recent NSTEMI s/p stent placement, Hep C,  CKD stage 3 mixed systolic and diastolic CHF, obesity she presents for    1. Hypertension and CKD stage 3: she is complaint with all medications. She has not started lisinopril 10 mg daily. She denies CP, SOB. She has leg swelling.    2. CHRONIC DIABETES with microabuminuria, CKD stage 4, not on ace inhibitor due to hyperkalemia   Disease Monitoring  Blood Sugar Ranges:   Fasting: in 200s   Postprandial: 100s-200s  Polyuria: no   Visual problems: no  Medication Compliance: taking novolog 25 U 3 times a day. Taking lantus 35 U BID.  Medication Side Effects  Hypoglycemia: no    3. OSA: she has severe OSA diagnosed in 12/2014 via sleep study. She has never used CPAP. She is uninsured.   4. Left hand pain: off and on since 2011. She injured her hand in a freezer door in 01/2016. X-Uzzle has been normal. She had no fracture. She has mild swelling. She has stiffness in her fingers. She has CKD stage 3 and does not take NSAIDs. She tried diclofenac gel without relief of pain.   Social History  Substance Use Topics  . Smoking status: Former Smoker    Quit date: 10/25/1980  . Smokeless tobacco: Never Used     Comment: quit smoking in 1982  . Alcohol use Yes     Comment: 3 times in last year    Outpatient Medications Prior to Visit  Medication Sig Dispense Refill  . acetaminophen-codeine (TYLENOL #3) 300-30 MG tablet Take 1 tablet by mouth every 8 (eight) hours as needed for moderate pain. 60 tablet 2  . albuterol (PROVENTIL) (2.5 MG/3ML) 0.083% nebulizer solution Take 3 mLs (2.5 mg total) by nebulization every 6 (six) hours as needed for wheezing or shortness of breath. 150 mL 1  . albuterol (VENTOLIN HFA) 108 (90 Base)  MCG/ACT inhaler Inhale 2 puffs into the lungs every 4 (four) hours as needed for wheezing or shortness of breath. 54 g 3  . aspirin EC 81 MG EC tablet Take 1 tablet (81 mg total) by mouth daily.    [name] atorvastatin (LIPITOR) 80 MG tablet Take 1 tablet (80 mg total) by mouth daily at 6 PM. 30 tablet 11  . Blood Glucose Monitoring Suppl (TRUE METRIX METER) DEVI 1 each by Does not apply route 3 (three) times daily before meals. 1 Device 0  . carvedilol (COREG) 25 MG tablet TAKE 1 TABLET BY MOUTH 2 TIMES DAILY WITH A MEAL. 60 tablet 2  . colchicine 0.6 MG tablet Take 1 tablet (0.6 mg total) by mouth daily. 30 tablet 2  . diclofenac sodium (VOLTAREN) 1 % GEL Apply 4 g topically daily as needed (for pain). 100 g 2  . ferrous sulfate 325 (65 FE) MG tablet Take 1 tablet (325 mg total) by mouth 2 (two) times daily with a meal. 60 tablet 3  . fluticasone (FLONASE) 50 MCG/ACT nasal spray Place 2 sprays into both nostrils daily as needed for allergies or rhinitis. 16 g 5  . Fluticasone-Salmeterol (ADVAIR DISKUS) 250-50 MCG/DOSE AEPB Inhale 1 puff into the lungs 2 (two) times daily. 60 each 3  . furosemide (LASIX) 40 MG tablet Take 1 tablet (  40 mg total) by mouth 2 (two) times daily. 60 tablet 1  . glucose blood (TRUE METRIX BLOOD GLUCOSE TEST) test strip Use 3 times before meals 100 each 12  . hydrALAZINE (APRESOLINE) 50 MG tablet Take 1 tablet (50 mg total) by mouth 3 (three) times daily. 90 tablet 1  . hydrOXYzine (ATARAX/VISTARIL) 50 MG tablet Take 1 tablet (50 mg total) by mouth 3 (three) times daily as needed. 90 tablet 1  . insulin aspart (NOVOLOG) 100 UNIT/ML FlexPen Inject 25 Units into the skin 3 (three) times daily with meals. 30 mL 3  . insulin aspart (NOVOLOG) 100 UNIT/ML injection Before each meal 3 times a day, 140-199 - 2 units, 200-250 - 4 units, 251-299 - 6 units,  300-349 - 8 units,  350 or above 10 units. Dispense syringes and needles as needed, Ok to switch to PEN if approved. Substitute to any  brand approved. DX DM2, Code E11.65 1 vial 2  . Insulin Glargine (LANTUS SOLOSTAR) 100 UNIT/ML Solostar Pen Inject 35 Units into the skin 2 (two) times daily. 15 mL 2  . Insulin Syringes, Disposable, U-100 0.5 ML MISC Inject insulin as directed 100 each 0  . isosorbide mononitrate (IMDUR) 30 MG 24 hr tablet Take 1 tablet (30 mg total) by mouth daily. 30 tablet 3  . ketotifen (ZADITOR) 0.025 % ophthalmic solution Place 1 drop into both eyes daily as needed (for dry eyes). 10 mL 2  . lisinopril (PRINIVIL,ZESTRIL) 10 MG tablet Take 1 tablet (10 mg total) by mouth daily. 30 tablet 1  . meclizine (ANTIVERT) 25 MG tablet Take 1 tablet (25 mg total) by mouth 3 (three) times daily as needed. 60 tablet 2  . pregabalin (LYRICA) 100 MG capsule Take 1 capsule (100 mg total) by mouth 3 (three) times daily. 90 capsule 1  . ranitidine (ZANTAC) 150 MG tablet Take 1 tablet (150 mg total) by mouth 2 (two) times daily. 60 tablet 3  . sitaGLIPtin (JANUVIA) 25 MG tablet Take 1 tablet (25 mg total) by mouth daily. For PASS 90 tablet 3  . ticagrelor (BRILINTA) 90 MG TABS tablet Take 1 tablet (90 mg total) by mouth 2 (two) times daily. 180 tablet 3  . TRUEPLUS LANCETS 28G MISC 1 each by Does not apply route 3 (three) times daily before meals. 100 each 12  . valACYclovir (VALTREX) 500 MG tablet TAKE 1 TABLET BY MOUTH DAILY. 30 tablet 0   No facility-administered medications prior to visit.     ROS Review of Systems  Constitutional: Positive for fatigue. Negative for chills and fever.  Eyes: Negative for visual disturbance.  Respiratory: Positive for shortness of breath.   Cardiovascular: Positive for leg swelling. Negative for chest pain.  Gastrointestinal: Negative for blood in stool.  Genitourinary: Negative for dysuria.  Musculoskeletal: Positive for arthralgias (R hand ). Negative for back pain.  Skin: Negative for rash.  Allergic/Immunologic: Negative for immunocompromised state.  Neurological:        Forgetfulness  Hematological: Negative for adenopathy. Does not bruise/bleed easily.  Psychiatric/Behavioral: Negative for dysphoric mood and suicidal ideas.    Objective:  BP (!) 164/83   Pulse 81   Temp 98 F (36.7 C) (Oral)   Ht '5\' 8"'$  (1.727 m)   Wt (!) 302 lb 9.6 oz (137.3 kg)   SpO2 97%   BMI 46.01 kg/m   BP/Weight 01/22/2017 0/17/5102 11/18/5275  Systolic BP 824 235 361  Diastolic BP 83 73 65  Wt. (Lbs) 302.6 297.8 299  BMI 46.01 45.28 45.46   Wt Readings from Last 3 Encounters:  01/22/17 (!) 302 lb 9.6 oz (137.3 kg)  01/07/17 297 lb 12.8 oz (135.1 kg)  12/17/16 299 lb (135.6 kg)    Physical Exam  Constitutional: She is oriented to person, place, and time. She appears well-developed and well-nourished. No distress.  Obese   HENT:  Head: Normocephalic and atraumatic.  Cardiovascular: Normal rate, regular rhythm, normal heart sounds and intact distal pulses.   Pulmonary/Chest: Effort normal and breath sounds normal.  Abdominal: Soft. Bowel sounds are normal.  Musculoskeletal: She exhibits edema (trace).  Neurological: She is alert and oriented to person, place, and time.  Skin: Skin is warm and dry. No rash noted.  Psychiatric: She has a normal mood and affect.   Lab Results  Component Value Date   HGBA1C 12.2 01/07/2017    CBG 298     Component Value Date/Time   NA 142 01/07/2017 0946   K 4.1 01/07/2017 0946   CL 104 01/07/2017 0946   CO2 20 01/07/2017 0946   BUN 59 (H) 01/07/2017 0946   CREATININE 2.02 (H) 01/07/2017 0946   CREATININE 1.36 (H) 09/23/2016 1451      Component Value Date/Time   CALCIUM 8.8 01/07/2017 0946   ALKPHOS 185 (H) 11/17/2016 1632   AST 28 11/17/2016 1632   ALT 31 11/17/2016 1632   BILITOT 0.3 11/17/2016 1632      Assessment & Plan:  Lenay was seen today for hypertension and diabetes.  Diagnoses and all orders for this visit:  Uncontrolled type 2 diabetes mellitus with complication, with long-term current use of insulin  ([place]) -     Discontinue: insulin aspart (NOVOLOG) 100 UNIT/ML FlexPen; Inject 25 Units into the skin 3 (three) times daily with meals. -     POCT glucose (manual entry) -     Cancel: POCT glycosylated hemoglobin (Hb A1C) -     Cancel: BMP8+EGFR -     insulin aspart (NOVOLOG) 100 UNIT/ML injection; Inject 25 Units into the skin 3 (three) times daily before meals. -     Insulin Glargine (LANTUS SOLOSTAR) 100 UNIT/ML Solostar Pen; Inject 40 Units into the skin 2 (two) times daily.  CKD (chronic kidney disease) stage 3, GFR 30-59 ml/min -     Cancel: BMP8+EGFR  Essential hypertension -     Cancel: EXN1+ZGYF  Chronic diastolic CHF (congestive heart failure), NYHA class 2 (HCC) -     Ambulatory referral to Cardiology  OSA (obstructive sleep apnea) -     Ambulatory referral to Cardiology  Left hand pain -     methylPREDNISolone acetate (DEPO-MEDROL) injection 80 mg; Inject 1 mL (80 mg total) into the muscle once.   There are no diagnoses linked to this encounter.  No orders of the defined types were placed in this encounter.   Follow-up: Return in about 2 weeks (around 02/05/2017) for HTN, BP check and labs .   Boykin Nearing MD

## 2017-01-22 NOTE — Assessment & Plan Note (Signed)
Severe OSA Referral back to cardiology Order for CPAP written patient uninsured but there are som assistance resources

## 2017-01-22 NOTE — Patient Instructions (Addendum)
[name] was seen today for hypertension and diabetes.  Diagnoses and all orders for this visit:  Uncontrolled type 2 diabetes mellitus with complication, with long-term current use of insulin ([place]) -     Discontinue: insulin aspart (NOVOLOG) 100 UNIT/ML FlexPen; Inject 25 Units into the skin 3 (three) times daily with meals. -     POCT glucose (manual entry) -     Cancel: POCT glycosylated hemoglobin (Hb A1C) -     Cancel: BMP8+EGFR -     insulin aspart (NOVOLOG) 100 UNIT/ML injection; Inject 25 Units into the skin 3 (three) times daily before meals. -     Insulin Glargine (LANTUS SOLOSTAR) 100 UNIT/ML Solostar Pen; Inject 40 Units into the skin 2 (two) times daily.  CKD (chronic kidney disease) stage 3, GFR 30-59 ml/min -     Cancel: BMP8+EGFR  Essential hypertension -     Cancel: PHX5+AVWP  Chronic diastolic CHF (congestive heart failure), NYHA class 2 (HCC) -     Ambulatory referral to Cardiology  OSA (obstructive sleep apnea) -     Ambulatory referral to Cardiology  Left hand pain -     methylPREDNISolone acetate (DEPO-MEDROL) injection 80 mg; Inject 1 mL (80 mg total) into the muscle once.   Start lisinopril 10 mg daily Increase lantus to 40 U twice daily Depo medrol 80 mg IM for left hand pain this is a steroid used to treat inflammation   F/u in 2 weeks for HTN, BP check and labs   Dr. Adrian Blackwater

## 2017-01-22 NOTE — Assessment & Plan Note (Signed)
Left hand pain  Non responsive to topical NSAID or tylenol #3 Depo medrol 80 mg IM x one

## 2017-01-25 ENCOUNTER — Telehealth: Payer: Self-pay

## 2017-01-25 NOTE — Telephone Encounter (Signed)
Call placed to [employer] - CPAP assistance program # [phone] to confirm receipt of the referral and to make the payment for the CPAP machine. Voicemail message left requesting a call back to # [phone].

## 2017-01-27 ENCOUNTER — Telehealth: Payer: Self-pay | Admitting: Family Medicine

## 2017-01-27 NOTE — Telephone Encounter (Signed)
*  Disregard previous message*  Call placed to [employer] - CPAP assistance program # [phone] in order to check the status of fax sent for CPAP assistance program for patient and to pay the fee. No one answered. Left message asking for a call back.

## 2017-01-27 NOTE — Telephone Encounter (Signed)
Call placed to [employer] - CPAP assistance program # [phone] in order to check the status of CPAP delivery since we haven't received it. No one answered. Left message for a call back.

## 2017-02-01 ENCOUNTER — Telehealth: Payer: Self-pay

## 2017-02-01 ENCOUNTER — Other Ambulatory Visit: Payer: Self-pay | Admitting: Family Medicine

## 2017-02-01 ENCOUNTER — Telehealth: Payer: Self-pay | Admitting: Family Medicine

## 2017-02-01 DIAGNOSIS — A6000 Herpesviral infection of urogenital system, unspecified: Secondary | ICD-10-CM

## 2017-02-01 MED FILL — ?VALACYCLOVIR HCL 500MG TAB: 500 | 30 days supply | Qty: 30 | Fill #0

## 2017-02-01 MED FILL — hydrOXYzine HCL 50 MG TABS: 50 | 30 days supply | Qty: 90 | Fill #1

## 2017-02-01 NOTE — Telephone Encounter (Signed)
Received a call from [facility] from the [employer] in regards patient's referral for a CPAP. A  payment towards it was made for $100. The transaction number is [id number]. Lilia Pro stated that the machine will be shipped out tomorrow and should take less than a week for [place] to receive it.

## 2017-02-01 NOTE — Telephone Encounter (Signed)
Call placed to the [employer]/CPAP assistance program # [phone] to make a payment for the CPAP machine. Voicemail message left requesting a call back to # [phone].

## 2017-02-05 ENCOUNTER — Ambulatory Visit: Payer: Medicaid Other | Attending: Family Medicine | Admitting: *Deleted

## 2017-02-05 VITALS — BP 173/96 | HR 70 | Resp 16

## 2017-02-05 DIAGNOSIS — I10 Essential (primary) hypertension: Secondary | ICD-10-CM | POA: Diagnosis not present

## 2017-02-05 MED FILL — ATORVASTATIN 80 MG TABLET: 80 | 30 days supply | Qty: 30 | Fill #0

## 2017-02-05 MED FILL — ACETAMINOPHEN/COD #3 TABLET: 300-30 | 20 days supply | Qty: 60 | Fill #0

## 2017-02-05 NOTE — Progress Notes (Signed)
Pt arrived to [facility], alert and oriented and arrives in good spirits. Last OV  01/22/17 with Dr. Adrian Blackwater.    Pt denies chest pain, HA, dizziness, or blurred vision.  SOB and BLE swelling at baseline. No worsening SOB or swelling in lower extremities since starting new medication. Verified medication. Pt states she did not take medication today prior to  appointment.  Blood pressure reading: 173/96 Pt states she did not take medication today prior to appointment.  MD aware of BP reading. No protocol order to implement at this time.  Plan- Pt instructed to take blood pressure medication prior to appointment for return BP check.  Labs- per Dr. Adrian Blackwater    LAST OV 01/22/2017 Med: Restarting lisinopril 10 mg daily P:F/u in 2 weeks for recheck of BP and labs

## 2017-02-06 LAB — BMP8+EGFR
BUN/Creatinine Ratio: 29 — ABNORMAL HIGH (ref 9–23)
BUN: 52 mg/dL — ABNORMAL HIGH (ref 6–24)
CO2: 20 mmol/L (ref 20–29)
Calcium: 8.5 mg/dL — ABNORMAL LOW (ref 8.7–10.2)
Chloride: 102 mmol/L (ref 96–106)
Creatinine, Ser: 1.77 mg/dL — ABNORMAL HIGH (ref 0.57–1.00)
GFR calc Af Amer: 36 mL/min/{1.73_m2} — ABNORMAL LOW (ref >59)
GFR calc non Af Amer: 31 mL/min/{1.73_m2} — ABNORMAL LOW (ref >59)
Glucose: 158 mg/dL — ABNORMAL HIGH (ref 65–99)
Potassium: 4.2 mmol/L (ref 3.5–5.2)
Sodium: 139 mmol/L (ref 134–144)

## 2017-02-08 ENCOUNTER — Telehealth: Payer: Self-pay

## 2017-02-08 NOTE — Telephone Encounter (Signed)
Pt was called and informed of lab results. Pt also agreed to see Dr.Funches on Friday at 9:30.

## 2017-02-12 ENCOUNTER — Encounter: Payer: Self-pay | Admitting: Family Medicine

## 2017-02-12 ENCOUNTER — Ambulatory Visit: Payer: Medicaid Other | Attending: Family Medicine | Admitting: Family Medicine

## 2017-02-12 ENCOUNTER — Telehealth: Payer: Self-pay

## 2017-02-12 VITALS — BP 122/74 | HR 70 | Temp 98.0°F | Ht 68.0 in | Wt 299.2 lb

## 2017-02-12 DIAGNOSIS — I130 Hypertensive heart and chronic kidney disease with heart failure and stage 1 through stage 4 chronic kidney disease, or unspecified chronic kidney disease: Secondary | ICD-10-CM | POA: Diagnosis not present

## 2017-02-12 DIAGNOSIS — I252 Old myocardial infarction: Secondary | ICD-10-CM | POA: Diagnosis not present

## 2017-02-12 DIAGNOSIS — K219 Gastro-esophageal reflux disease without esophagitis: Secondary | ICD-10-CM

## 2017-02-12 DIAGNOSIS — G8929 Other chronic pain: Secondary | ICD-10-CM

## 2017-02-12 DIAGNOSIS — N183 Chronic kidney disease, stage 3 unspecified: Secondary | ICD-10-CM

## 2017-02-12 DIAGNOSIS — E118 Type 2 diabetes mellitus with unspecified complications: Secondary | ICD-10-CM

## 2017-02-12 DIAGNOSIS — E669 Obesity, unspecified: Secondary | ICD-10-CM | POA: Diagnosis not present

## 2017-02-12 DIAGNOSIS — IMO0002 Reserved for concepts with insufficient information to code with codable children: Secondary | ICD-10-CM

## 2017-02-12 DIAGNOSIS — Z1231 Encounter for screening mammogram for malignant neoplasm of breast: Secondary | ICD-10-CM

## 2017-02-12 DIAGNOSIS — Z87891 Personal history of nicotine dependence: Secondary | ICD-10-CM | POA: Diagnosis not present

## 2017-02-12 DIAGNOSIS — Z794 Long term (current) use of insulin: Secondary | ICD-10-CM

## 2017-02-12 DIAGNOSIS — I5033 Acute on chronic diastolic (congestive) heart failure: Secondary | ICD-10-CM | POA: Insufficient documentation

## 2017-02-12 DIAGNOSIS — A6000 Herpesviral infection of urogenital system, unspecified: Secondary | ICD-10-CM | POA: Insufficient documentation

## 2017-02-12 DIAGNOSIS — I25110 Atherosclerotic heart disease of native coronary artery with unstable angina pectoris: Secondary | ICD-10-CM | POA: Insufficient documentation

## 2017-02-12 DIAGNOSIS — Z124 Encounter for screening for malignant neoplasm of cervix: Secondary | ICD-10-CM

## 2017-02-12 DIAGNOSIS — Z6841 Body Mass Index (BMI) 40.0 and over, adult: Secondary | ICD-10-CM | POA: Diagnosis not present

## 2017-02-12 DIAGNOSIS — Z79899 Other long term (current) drug therapy: Secondary | ICD-10-CM | POA: Diagnosis not present

## 2017-02-12 DIAGNOSIS — J45909 Unspecified asthma, uncomplicated: Secondary | ICD-10-CM

## 2017-02-12 DIAGNOSIS — M1030 Gout due to renal impairment, unspecified site: Secondary | ICD-10-CM | POA: Insufficient documentation

## 2017-02-12 DIAGNOSIS — E1122 Type 2 diabetes mellitus with diabetic chronic kidney disease: Secondary | ICD-10-CM | POA: Insufficient documentation

## 2017-02-12 DIAGNOSIS — I10 Essential (primary) hypertension: Secondary | ICD-10-CM

## 2017-02-12 DIAGNOSIS — D631 Anemia in chronic kidney disease: Secondary | ICD-10-CM | POA: Diagnosis not present

## 2017-02-12 DIAGNOSIS — Z7951 Long term (current) use of inhaled steroids: Secondary | ICD-10-CM | POA: Diagnosis not present

## 2017-02-12 DIAGNOSIS — E1165 Type 2 diabetes mellitus with hyperglycemia: Secondary | ICD-10-CM

## 2017-02-12 DIAGNOSIS — M1A39X0 Chronic gout due to renal impairment, multiple sites, without tophus (tophi): Secondary | ICD-10-CM

## 2017-02-12 DIAGNOSIS — M25561 Pain in right knee: Secondary | ICD-10-CM | POA: Diagnosis not present

## 2017-02-12 DIAGNOSIS — R1310 Dysphagia, unspecified: Secondary | ICD-10-CM | POA: Insufficient documentation

## 2017-02-12 DIAGNOSIS — Z9109 Other allergy status, other than to drugs and biological substances: Secondary | ICD-10-CM

## 2017-02-12 DIAGNOSIS — G4733 Obstructive sleep apnea (adult) (pediatric): Secondary | ICD-10-CM

## 2017-02-12 DIAGNOSIS — Z7982 Long term (current) use of aspirin: Secondary | ICD-10-CM | POA: Diagnosis not present

## 2017-02-12 DIAGNOSIS — R1319 Other dysphagia: Secondary | ICD-10-CM | POA: Insufficient documentation

## 2017-02-12 LAB — GLUCOSE, POCT (MANUAL RESULT ENTRY): POC Glucose: 215 mg/dl — AB (ref 70–99)

## 2017-02-12 LAB — POCT GLYCOSYLATED HEMOGLOBIN (HGB A1C): Hemoglobin A1C: 9.8

## 2017-02-12 MED ORDER — VALACYCLOVIR HCL 500 MG PO TABS: 500.0000 mg | ORAL_TABLET | Freq: Every day | ORAL | 3 refills | Status: DC

## 2017-02-12 MED ORDER — HYDRALAZINE HCL 50 MG PO TABS: 50.0000 mg | ORAL_TABLET | Freq: Three times a day (TID) | ORAL | 3 refills | Status: DC

## 2017-02-12 MED ORDER — CARVEDILOL 25 MG PO TABS: ORAL_TABLET | ORAL | 3 refills | Status: DC

## 2017-02-12 MED ORDER — ATORVASTATIN CALCIUM 80 MG PO TABS: 80.0000 mg | ORAL_TABLET | Freq: Every day | ORAL | 3 refills | Status: DC

## 2017-02-12 MED ORDER — OMEPRAZOLE 20 MG PO CPDR: 20.0000 mg | DELAYED_RELEASE_CAPSULE | Freq: Every day | ORAL | 3 refills | Status: DC

## 2017-02-12 MED ORDER — INSULIN PEN NEEDLE 31G X 8 MM MISC: 1.0000 "application " | Freq: Every day | 3 refills | Status: DC

## 2017-02-12 MED ORDER — ALLOPURINOL 100 MG PO TABS: 100.0000 mg | ORAL_TABLET | Freq: Every day | ORAL | 6 refills | Status: DC

## 2017-02-12 MED FILL — ALLOPURINOL 100 MG TABLET: 100 | 30 days supply | Qty: 30 | Fill #0

## 2017-02-12 MED FILL — hydrALAZINE HCL 50 MG TABS: 50 | 30 days supply | Qty: 90 | Fill #0

## 2017-02-12 MED FILL — CARVEDILOL 25 MG TABLET: 25 | 30 days supply | Qty: 60 | Fill #0

## 2017-02-12 MED FILL — ?OMEPRAZOLE DR 20 MG CAPSUL: 20 | 30 days supply | Qty: 30 | Fill #0

## 2017-02-12 MED FILL — TRUEPLUS PEN NDL 31GX5/16": 31G X 8 MM | 30 days supply | Qty: 90 | Fill #0

## 2017-02-12 MED FILL — TRUEPLUS PEN NDL 31GX5/16: 31G X 8 MM | 30 days supply | Qty: 90 | Fill #0

## 2017-02-12 NOTE — Assessment & Plan Note (Signed)
Dysphagia with GERD Change from zantac to Prilosec 20 mg daily Swallow study ordered

## 2017-02-12 NOTE — Assessment & Plan Note (Signed)
CPAP has arrived She will initiate therapy next week

## 2017-02-12 NOTE — Assessment & Plan Note (Signed)
A: arthralgia with elevated uric acid P: Continue low dose colchicine 0.6 mg daily Add allopurinol 100 mg daily Checking uric acid level today

## 2017-02-12 NOTE — Telephone Encounter (Signed)
CPAP received. Spoke to [facility] with [facility] Res[facility] Therapy Department and an appointment for CPAP teaching was scheduled for 02/18/17 @ 1000 @ [place]. Doloris Hall, CMA to notify the patient while she was in the clinic today.

## 2017-02-12 NOTE — Patient Instructions (Addendum)
[name] was seen today for hypertension.  Diagnoses and all orders for this visit:  Uncontrolled type 2 diabetes mellitus with complication, with long-term current use of insulin (HCC) -     POCT glucose (manual entry) -     Insulin Pen Needle (B-D ULTRAFINE III SHORT PEN) 31G X 8 MM MISC; 1 application by Does not apply route daily.  Acute on chronic diastolic congestive heart failure, NYHA class 2 (HCC) -     carvedilol (COREG) 25 MG tablet; TAKE 1 TABLET BY MOUTH 2 TIMES DAILY WITH A MEAL.  Uncontrolled type 2 diabetes mellitus with complication, without long-term current use of insulin (HCC) -     atorvastatin (LIPITOR) 80 MG tablet; Take 1 tablet (80 mg total) by mouth daily at 6 PM.  Uncomplicated asthma, unspecified asthma severity, unspecified whether persistent -     atorvastatin (LIPITOR) 80 MG tablet; Take 1 tablet (80 mg total) by mouth daily at 6 PM.  Genital herpes simplex, unspecified site -     atorvastatin (LIPITOR) 80 MG tablet; Take 1 tablet (80 mg total) by mouth daily at 6 PM.  Acute on chronic diastolic congestive heart failure, NYHA class 4 (HCC) -     atorvastatin (LIPITOR) 80 MG tablet; Take 1 tablet (80 mg total) by mouth daily at 6 PM.  Anemia in stage 3 chronic kidney disease -     atorvastatin (LIPITOR) 80 MG tablet; Take 1 tablet (80 mg total) by mouth daily at 6 PM.  Essential hypertension -     atorvastatin (LIPITOR) 80 MG tablet; Take 1 tablet (80 mg total) by mouth daily at 6 PM. -     BMP8+EGFR  Coronary artery disease involving native coronary artery of native heart with unstable angina pectoris (HCC) -     atorvastatin (LIPITOR) 80 MG tablet; Take 1 tablet (80 mg total) by mouth daily at 6 PM. -     hydrALAZINE (APRESOLINE) 50 MG tablet; Take 1 tablet (50 mg total) by mouth 3 (three) times daily.  Environmental allergies -     atorvastatin (LIPITOR) 80 MG tablet; Take 1 tablet (80 mg total) by mouth daily at 6 PM.  Herpes simplex infection of  genitourinary system -     valACYclovir (VALTREX) 500 MG tablet; Take 1 tablet (500 mg total) by mouth daily.  Chronic pain of right knee -     Ambulatory referral to Orthopedic Surgery  Pap smear for cervical cancer screening -     Ambulatory referral to Gynecology  Visit for screening mammogram -     MM DIGITAL SCREENING BILATERAL; Future  Chronic gout due to renal impairment of multiple sites without tophus -     allopurinol (ZYLOPRIM) 100 MG tablet; Take 1 tablet (100 mg total) by mouth daily. -     Uric Acid  Gastroesophageal reflux disease without esophagitis -     omeprazole (PRILOSEC) 20 MG capsule; Take 1 capsule (20 mg total) by mouth daily.  Esophageal dysphagia -     DG Esophagus; Future   Please call Jonna Clark  [phone], with the [facility] (breast and cervical cancer control program) to schedule  Mammogram.  You will be called with time for CPAP teaching, your machine has arrived Start allopurinol to lower uric acid level and prevent gout flare Start prilosec for reflux Swallow study ordered  F/u in 6 weeks for gout, trouble swallowing, diabetes   Dr. Adrian Blackwater

## 2017-02-12 NOTE — Assessment & Plan Note (Signed)
A: normotensive today since starting lisinopril 10 mg daily. She has CKD stage 3.  P: Check BMP Continue current regimen

## 2017-02-12 NOTE — Assessment & Plan Note (Signed)
Chronic pain Obese female Ortho referral placed

## 2017-02-12 NOTE — Progress Notes (Signed)
Subjective:  Patient ID: [name], female    DOB: 10-04-1955  Age: 61 y.o. MRN: [id number]  CC: Hypertension   HPI [name] has asthma, OSA, uncontrolled insulin dependent  Diabetes type 2, HTN, CAD, recent NSTEMI s/p stent placement, Hep C,  CKD stage 3 mixed systolic and diastolic CHF, obesity she presents for    1. Hypertension and CKD stage 3: she is complaint with all medications including the most recent lisinopril 10 mg daily. She denies CP, SOB and leg swelling.  2. CHRONIC DIABETES with microabuminuria, CKD stage 4, not on ace inhibitor due to hyperkalemia   Disease Monitoring  Blood Sugar Ranges:   Fasting: in 200s   Postprandial: 100s-200s  Polyuria: no   Visual problems: no  Medication Compliance: taking novolog 25 U 3 times a day. Taking lantus 40 U BID.  Medication Side Effects  Hypoglycemia: no   3. OSA: she has severe OSA diagnosed in 12/2014 via sleep study. She has never used CPAP. Her refurbished CPAP has arrived. She has an appointment next weeks for teaching.   4. Arthralgias: she has pain in hands, feet and knees. . Intermittent stiffness in joints. She has gout and takes colchicine which dose helps with pain.  She has CKD stage 3 and does not take NSAIDs. She tried diclofenac gel without relief of pain.   5. GERD: she is taking zantac. She still has GERD. She has trouble swallowing with sensation of food getting stuck in throat.   Social History  Substance Use Topics  . Smoking status: Former Smoker    Quit date: 10/25/1980  . Smokeless tobacco: Never Used     Comment: quit smoking in 1982  . Alcohol use Yes     Comment: 3 times in last year    Outpatient Medications Prior to Visit  Medication Sig Dispense Refill  . acetaminophen-codeine (TYLENOL #3) 300-30 MG tablet Take 1 tablet by mouth every 8 (eight) hours as needed for moderate pain. 60 tablet 2  . albuterol (PROVENTIL) (2.5 MG/3ML) 0.083% nebulizer solution Take 3 mLs (2.5 mg total)  by nebulization every 6 (six) hours as needed for wheezing or shortness of breath. 150 mL 1  . albuterol (VENTOLIN HFA) 108 (90 Base) MCG/ACT inhaler Inhale 2 puffs into the lungs every 4 (four) hours as needed for wheezing or shortness of breath. 54 g 3  . aspirin EC 81 MG EC tablet Take 1 tablet (81 mg total) by mouth daily.    [name] atorvastatin (LIPITOR) 80 MG tablet Take 1 tablet (80 mg total) by mouth daily at 6 PM. 30 tablet 11  . Blood Glucose Monitoring Suppl (TRUE METRIX METER) DEVI 1 each by Does not apply route 3 (three) times daily before meals. 1 Device 0  . carvedilol (COREG) 25 MG tablet TAKE 1 TABLET BY MOUTH 2 TIMES DAILY WITH A MEAL. 60 tablet 2  . colchicine 0.6 MG tablet Take 1 tablet (0.6 mg total) by mouth daily. 30 tablet 2  . diclofenac sodium (VOLTAREN) 1 % GEL Apply 4 g topically daily as needed (for pain). 100 g 2  . ferrous sulfate 325 (65 FE) MG tablet Take 1 tablet (325 mg total) by mouth 2 (two) times daily with a meal. 60 tablet 3  . fluticasone (FLONASE) 50 MCG/ACT nasal spray Place 2 sprays into both nostrils daily as needed for allergies or rhinitis. 16 g 5  . Fluticasone-Salmeterol (ADVAIR DISKUS) 250-50 MCG/DOSE AEPB Inhale 1 puff into the  lungs 2 (two) times daily. 60 each 3  . furosemide (LASIX) 40 MG tablet Take 1 tablet (40 mg total) by mouth 2 (two) times daily. 60 tablet 1  . glucose blood (TRUE METRIX BLOOD GLUCOSE TEST) test strip Use 3 times before meals 100 each 12  . hydrALAZINE (APRESOLINE) 50 MG tablet Take 1 tablet (50 mg total) by mouth 3 (three) times daily. 90 tablet 1  . hydrOXYzine (ATARAX/VISTARIL) 50 MG tablet Take 1 tablet (50 mg total) by mouth 3 (three) times daily as needed. 90 tablet 1  . insulin aspart (NOVOLOG) 100 UNIT/ML injection Inject 25 Units into the skin 3 (three) times daily before meals. 3 vial 5  . Insulin Glargine (LANTUS SOLOSTAR) 100 UNIT/ML Solostar Pen Inject 40 Units into the skin 2 (two) times daily. 24 mL 2  . Insulin  Syringes, Disposable, U-100 0.5 ML MISC Inject insulin as directed 100 each 0  . isosorbide mononitrate (IMDUR) 30 MG 24 hr tablet Take 1 tablet (30 mg total) by mouth daily. 30 tablet 3  . ketotifen (ZADITOR) 0.025 % ophthalmic solution Place 1 drop into both eyes daily as needed (for dry eyes). 10 mL 2  . lisinopril (PRINIVIL,ZESTRIL) 10 MG tablet Take 1 tablet (10 mg total) by mouth daily. 30 tablet 1  . ranitidine (ZANTAC) 150 MG tablet Take 1 tablet (150 mg total) by mouth 2 (two) times daily. 60 tablet 3  . sitaGLIPtin (JANUVIA) 25 MG tablet Take 1 tablet (25 mg total) by mouth daily. For PASS 90 tablet 3  . ticagrelor (BRILINTA) 90 MG TABS tablet Take 1 tablet (90 mg total) by mouth 2 (two) times daily. 180 tablet 3  . TRUEPLUS LANCETS 28G MISC 1 each by Does not apply route 3 (three) times daily before meals. 100 each 12  . valACYclovir (VALTREX) 500 MG tablet TAKE 1 TABLET BY MOUTH DAILY. 30 tablet 0  . meclizine (ANTIVERT) 25 MG tablet Take 1 tablet (25 mg total) by mouth 3 (three) times daily as needed. (Patient not taking: Reported on 02/05/2017) 60 tablet 2  . pregabalin (LYRICA) 100 MG capsule Take 1 capsule (100 mg total) by mouth 3 (three) times daily. (Patient not taking: Reported on 02/05/2017) 90 capsule 1   No facility-administered medications prior to visit.     ROS Review of Systems  Constitutional: Positive for fatigue. Negative for chills and fever.  HENT: Positive for trouble swallowing.   Eyes: Negative for visual disturbance.  Respiratory: Negative for shortness of breath.   Cardiovascular: Negative for chest pain and leg swelling.  Gastrointestinal: Negative for blood in stool.  Genitourinary: Negative for dysuria.  Musculoskeletal: Positive for arthralgias (R hand, R knee ). Negative for back pain.  Skin: Negative for rash.  Allergic/Immunologic: Negative for immunocompromised state.  Neurological:       Forgetfulness  Hematological: Negative for adenopathy.  Does not bruise/bleed easily.  Psychiatric/Behavioral: Negative for dysphoric mood and suicidal ideas.    Objective:  BP 122/74   Pulse 70   Temp 98 F (36.7 C) (Oral)   Ht 5' 8" (1.727 m)   Wt 299 lb 3.2 oz (135.7 kg)   SpO2 98%   BMI 45.49 kg/m   BP/Weight 02/12/2017 02/05/2017 5/00/9381  Systolic BP 829 937 169  Diastolic BP 74 96 83  Wt. (Lbs) 299.2 - 302.6  BMI 45.49 - 46.01   Wt Readings from Last 3 Encounters:  02/12/17 299 lb 3.2 oz (135.7 kg)  01/22/17 (!) 302 lb 9.6  oz (137.3 kg)  01/07/17 297 lb 12.8 oz (135.1 kg)    Physical Exam  Constitutional: She is oriented to person, place, and time. She appears well-developed and well-nourished. No distress.  Obese   HENT:  Head: Normocephalic and atraumatic.  Cardiovascular: Normal rate, regular rhythm, normal heart sounds and intact distal pulses.   Pulmonary/Chest: Effort normal and breath sounds normal.  Abdominal: Soft. Bowel sounds are normal.  Musculoskeletal: She exhibits edema (trace).  Neurological: She is alert and oriented to person, place, and time.  Skin: Skin is warm and dry. No rash noted.  Psychiatric: She has a normal mood and affect.   Lab Results  Component Value Date   HGBA1C 12.2 01/07/2017   Lab Results  Component Value Date   HGBA1C 9.8 02/12/2017   CBG 215  Lab Results  Component Value Date   LABURIC 10.9 (H) 08/21/2016       Component Value Date/Time   NA 139 02/05/2017 1026   K 4.2 02/05/2017 1026   CL 102 02/05/2017 1026   CO2 20 02/05/2017 1026   BUN 52 (H) 02/05/2017 1026   CREATININE 1.77 (H) 02/05/2017 1026   CREATININE 1.36 (H) 09/23/2016 1451      Component Value Date/Time   CALCIUM 8.5 (L) 02/05/2017 1026   ALKPHOS 185 (H) 11/17/2016 1632   AST 28 11/17/2016 1632   ALT 31 11/17/2016 1632   BILITOT 0.3 11/17/2016 1632      Assessment & Plan:  Greyson was seen today for hypertension.  Diagnoses and all orders for this visit:  Uncontrolled type 2 diabetes  mellitus with complication, with long-term current use of insulin (HCC) -     POCT glucose (manual entry) -     Insulin Pen Needle (B-D ULTRAFINE III SHORT PEN) 31G X 8 MM MISC; 1 application by Does not apply route daily. -     POCT glycosylated hemoglobin (Hb A1C)  Acute on chronic diastolic congestive heart failure, NYHA class 2 (HCC) -     carvedilol (COREG) 25 MG tablet; TAKE 1 TABLET BY MOUTH 2 TIMES DAILY WITH A MEAL.  Uncontrolled type 2 diabetes mellitus with complication, without long-term current use of insulin (HCC) -     atorvastatin (LIPITOR) 80 MG tablet; Take 1 tablet (80 mg total) by mouth daily at 6 PM.  Uncomplicated asthma, unspecified asthma severity, unspecified whether persistent -     atorvastatin (LIPITOR) 80 MG tablet; Take 1 tablet (80 mg total) by mouth daily at 6 PM.  Genital herpes simplex, unspecified site -     atorvastatin (LIPITOR) 80 MG tablet; Take 1 tablet (80 mg total) by mouth daily at 6 PM.  Acute on chronic diastolic congestive heart failure, NYHA class 4 (HCC) -     atorvastatin (LIPITOR) 80 MG tablet; Take 1 tablet (80 mg total) by mouth daily at 6 PM.  Anemia in stage 3 chronic kidney disease -     atorvastatin (LIPITOR) 80 MG tablet; Take 1 tablet (80 mg total) by mouth daily at 6 PM.  Essential hypertension -     atorvastatin (LIPITOR) 80 MG tablet; Take 1 tablet (80 mg total) by mouth daily at 6 PM. -     BMP8+EGFR  Coronary artery disease involving native coronary artery of native heart with unstable angina pectoris (HCC) -     atorvastatin (LIPITOR) 80 MG tablet; Take 1 tablet (80 mg total) by mouth daily at 6 PM. -     hydrALAZINE (APRESOLINE) 50   MG tablet; Take 1 tablet (50 mg total) by mouth 3 (three) times daily.  Environmental allergies -     atorvastatin (LIPITOR) 80 MG tablet; Take 1 tablet (80 mg total) by mouth daily at 6 PM.  Herpes simplex infection of genitourinary system -     valACYclovir (VALTREX) 500 MG tablet; Take 1  tablet (500 mg total) by mouth daily.  Chronic pain of right knee -     Ambulatory referral to Orthopedic Surgery  Pap smear for cervical cancer screening -     Ambulatory referral to Gynecology  Visit for screening mammogram -     MM DIGITAL SCREENING BILATERAL; Future  Chronic gout due to renal impairment of multiple sites without tophus -     allopurinol (ZYLOPRIM) 100 MG tablet; Take 1 tablet (100 mg total) by mouth daily. -     Uric Acid  Gastroesophageal reflux disease without esophagitis -     omeprazole (PRILOSEC) 20 MG capsule; Take 1 capsule (20 mg total) by mouth daily.  Esophageal dysphagia -     DG Esophagus; Future   There are no diagnoses linked to this encounter.  No orders of the defined types were placed in this encounter.   Follow-up: Return in about 6 weeks (around 03/26/2017) for HTN, DM, gout .     MD   

## 2017-02-13 LAB — BMP8+EGFR
BUN/Creatinine Ratio: 28 — ABNORMAL HIGH (ref 9–23)
BUN: 60 mg/dL — ABNORMAL HIGH (ref 6–24)
CO2: 18 mmol/L — ABNORMAL LOW (ref 20–29)
Calcium: 8.7 mg/dL (ref 8.7–10.2)
Chloride: 106 mmol/L (ref 96–106)
Creatinine, Ser: 2.11 mg/dL — ABNORMAL HIGH (ref 0.57–1.00)
GFR calc Af Amer: 29 mL/min/{1.73_m2} — ABNORMAL LOW (ref >59)
GFR calc non Af Amer: 25 mL/min/{1.73_m2} — ABNORMAL LOW (ref >59)
Glucose: 210 mg/dL — ABNORMAL HIGH (ref 65–99)
Potassium: 4.5 mmol/L (ref 3.5–5.2)
Sodium: 142 mmol/L (ref 134–144)

## 2017-02-13 LAB — URIC ACID: Uric Acid: 12.1 mg/dL — ABNORMAL HIGH (ref 2.5–7.1)

## 2017-02-17 ENCOUNTER — Encounter: Payer: Self-pay | Admitting: Cardiovascular Disease

## 2017-02-17 ENCOUNTER — Telehealth: Payer: Self-pay | Admitting: Family Medicine

## 2017-02-17 ENCOUNTER — Ambulatory Visit (INDEPENDENT_AMBULATORY_CARE_PROVIDER_SITE_OTHER): Payer: Self-pay | Admitting: Cardiovascular Disease

## 2017-02-17 VITALS — BP 124/60 | HR 82 | Ht 68.0 in | Wt 304.4 lb

## 2017-02-17 DIAGNOSIS — N189 Chronic kidney disease, unspecified: Secondary | ICD-10-CM

## 2017-02-17 DIAGNOSIS — I5042 Chronic combined systolic (congestive) and diastolic (congestive) heart failure: Secondary | ICD-10-CM

## 2017-02-17 MED ORDER — FUROSEMIDE 40 MG PO TABS: ORAL_TABLET | ORAL | 3 refills | Status: DC

## 2017-02-17 MED FILL — FUROSEMIDE 40 MG TABLET: 40 | 30 days supply | Qty: 90 | Fill #0

## 2017-02-17 NOTE — Progress Notes (Signed)
Cardiology Office Note Date:  02/17/2017   ID:  [name], DOB 09-06-1955, MRN [id number]  PCP:  Boykin Nearing, MD  Cardiologist:  Sherren Mocha, MD    Chief Complaint  Patient presents with  . Follow-up     History of Present Illness: [name] is a 61 y.o. female who presents for Follow-up of chronic combined systolic and diastolic heart failure and coronary artery disease. The patient was hospitalized in March 2018 with acute heart failure and non-ST elevation infarction. She underwent stenting of the LAD at that time. She was treated with IV diuresis and medical therapy for congestive heart failure.  The patient is here alone today. She continues to have problems with edema and shortness of breath. She admits to orthopnea but no PND. She's had no recent chest pain or tightness. She does have a feeling of indigestion at times it occurs with certain foods. She complains of generalized fatigue. There's been a balance with her diuretic dose because of chronic kidney disease and this is been followed closely by her primary physician.   Past Medical History:  Diagnosis Date  . Asthma    As a child   . Bronchitis   . CAD (coronary artery disease)    a. 09/2016: 50% Ost 1st Mrg stenosis, 50% 2nd Mrg stenosis, 20% Mid-Cx, 95% Prox LAD, 40% mid-LAD, and 10% dist-LAD stenosis. Staged PCI with DES to Prox-LAD.   [name] CHF (congestive heart failure) ([place]) 2011  . Complication of anesthesia   . Diabetes mellitus Dx 1989  . Hepatitis C Dx 2013  . Hypertension Dx 1989  . Obesity   . Pancreatitis 2013  . Refusal of blood transfusions as patient is Jehovah's Witness   . Ulcer 2010    Past Surgical History:  Procedure Laterality Date  . CHOLECYSTECTOMY    . CORONARY STENT INTERVENTION N/A 09/18/2016   Procedure: Coronary Stent Intervention;  Surgeon: Troy Sine, MD;  Location: [facility];  Service: Cardiovascular;  Laterality: N/A;  . EYE SURGERY    . KNEE ARTHROSCOPY     . LEFT HEART CATH N/A 09/18/2016   Procedure: Left Heart Cath;  Surgeon: Troy Sine, MD;  Location: [facility];  Service: Cardiovascular;  Laterality: N/A;  . LEFT HEART CATH AND CORONARY ANGIOGRAPHY N/A 09/16/2016   Procedure: Left Heart Cath and Coronary Angiography;  Surgeon: Burnell Blanks, MD;  Location: [facility];  Service: Cardiovascular;  Laterality: N/A;  . TUBAL LIGATION    . TUBAL LIGATION      Current Outpatient Prescriptions  Medication Sig Dispense Refill  . acetaminophen-codeine (TYLENOL #3) 300-30 MG tablet Take 1 tablet by mouth every 8 (eight) hours as needed for moderate pain. 60 tablet 2  . albuterol (PROVENTIL) (2.5 MG/3ML) 0.083% nebulizer solution Take 3 mLs (2.5 mg total) by nebulization every 6 (six) hours as needed for wheezing or shortness of breath. 150 mL 1  . albuterol (VENTOLIN HFA) 108 (90 Base) MCG/ACT inhaler Inhale 2 puffs into the lungs every 4 (four) hours as needed for wheezing or shortness of breath. 54 g 3  . allopurinol (ZYLOPRIM) 100 MG tablet Take 1 tablet (100 mg total) by mouth daily. 30 tablet 6  . amLODipine (NORVASC) 5 MG tablet Take 5 mg by mouth daily.  11  . aspirin EC 81 MG EC tablet Take 1 tablet (81 mg total) by mouth daily.    [name] atorvastatin (LIPITOR) 80 MG tablet Take  1 tablet (80 mg total) by mouth daily at 6 PM. 90 tablet 3  . Blood Glucose Monitoring Suppl (TRUE METRIX METER) DEVI 1 each by Does not apply route 3 (three) times daily before meals. 1 Device 0  . carvedilol (COREG) 25 MG tablet TAKE 1 TABLET BY MOUTH 2 TIMES DAILY WITH A MEAL. 180 tablet 3  . colchicine 0.6 MG tablet Take 1 tablet (0.6 mg total) by mouth daily. 30 tablet 2  . diclofenac sodium (VOLTAREN) 1 % GEL Apply 4 g topically daily as needed (for pain). 100 g 2  . ferrous sulfate 325 (65 FE) MG tablet Take 1 tablet (325 mg total) by mouth 2 (two) times daily with a meal. 60 tablet 3  . fluticasone (FLONASE) 50 MCG/ACT nasal spray Place 2  sprays into both nostrils daily as needed for allergies or rhinitis. 16 g 5  . Fluticasone-Salmeterol (ADVAIR DISKUS) 250-50 MCG/DOSE AEPB Inhale 1 puff into the lungs 2 (two) times daily. 60 each 3  . gabapentin (NEURONTIN) 100 MG capsule Take 200 mg by mouth 3 (three) times daily.  2  . glucose blood (TRUE METRIX BLOOD GLUCOSE TEST) test strip Use 3 times before meals 100 each 12  . hydrALAZINE (APRESOLINE) 50 MG tablet Take 1 tablet (50 mg total) by mouth 3 (three) times daily. 270 tablet 3  . hydrOXYzine (ATARAX/VISTARIL) 50 MG tablet Take 1 tablet (50 mg total) by mouth 3 (three) times daily as needed. 90 tablet 1  . insulin aspart (NOVOLOG) 100 UNIT/ML injection Inject 25 Units into the skin 3 (three) times daily before meals. 3 vial 5  . Insulin Glargine (LANTUS SOLOSTAR) 100 UNIT/ML Solostar Pen Inject 40 Units into the skin 2 (two) times daily. 24 mL 2  . Insulin Pen Needle (B-D ULTRAFINE III SHORT PEN) 31G X 8 MM MISC 1 application by Does not apply route daily. 90 each 3  . Insulin Syringes, Disposable, U-100 0.5 ML MISC Inject insulin as directed 100 each 0  . isosorbide mononitrate (IMDUR) 30 MG 24 hr tablet Take 1 tablet (30 mg total) by mouth daily. 30 tablet 3  . ketotifen (ZADITOR) 0.025 % ophthalmic solution Place 1 drop into both eyes daily as needed (for dry eyes). 10 mL 2  . lisinopril (PRINIVIL,ZESTRIL) 10 MG tablet Take 1 tablet (10 mg total) by mouth daily. 30 tablet 1  . omeprazole (PRILOSEC) 20 MG capsule Take 1 capsule (20 mg total) by mouth daily. 90 capsule 3  . pregabalin (LYRICA) 100 MG capsule Take 1 capsule (100 mg total) by mouth 3 (three) times daily. 90 capsule 1  . ranitidine (ZANTAC) 150 MG tablet Take 1 tablet (150 mg total) by mouth 2 (two) times daily. 60 tablet 3  . sitaGLIPtin (JANUVIA) 25 MG tablet Take 1 tablet (25 mg total) by mouth daily. For PASS 90 tablet 3  . ticagrelor (BRILINTA) 90 MG TABS tablet Take 1 tablet (90 mg total) by mouth 2 (two) times  daily. 180 tablet 3  . TRUEPLUS LANCETS 28G MISC 1 each by Does not apply route 3 (three) times daily before meals. 100 each 12  . valACYclovir (VALTREX) 500 MG tablet Take 1 tablet (500 mg total) by mouth daily. 90 tablet 3  . furosemide (LASIX) 40 MG tablet Take 2 pills (80 mg) in the morning and 40 mg in the afternoon 270 tablet 3   No current facility-administered medications for this visit.     Allergies:   Shellfish allergy; Diazepam; and  Morphine and related   Social History:  The patient  reports that she quit smoking about 36 years ago. She has never used smokeless tobacco. She reports that she drinks alcohol. She reports that she does not use drugs.   Family History:  The patient's family history includes Colon cancer in her mother; Diabetes in her paternal grandmother; Heart attack in her maternal grandmother and other; Hypertension in her brother and sister.    ROS:  Please see the history of present illness.  Otherwise, review of systems is positive for weight gain and fatigue.  All other systems are reviewed and negative.    PHYSICAL EXAM: VS:  BP 124/60   Pulse 82   Ht 5\' 8"  (1.727 m)   Wt (!) 304 lb 6.4 oz (138.1 kg)   BMI 46.28 kg/m  , BMI Body mass index is 46.28 kg/m. GEN: Well nourished, well developed, pleasant morbidly obese woman in no acute distress  HEENT: normal  Neck: Mild JVD, no masses. No carotid bruits Cardiac: RRR with 2/6 systolic ejection murmur at the right upper sternal border           Respiratory:  clear to auscultation bilaterally, normal work of breathing GI: soft, nontender, nondistended, + BS MS: no deformity or atrophy  Ext: 1+ bilateral pretibial edema, pedal pulses 2+= bilaterally Skin: warm and dry, no rash Neuro:  Strength and sensation are intact Psych: euthymic mood, full affect  EKG:  EKG is not ordered today.  Recent Labs: 09/13/2016: TSH 1.860 11/10/2016: NT-Pro BNP 202 11/17/2016: ALT 31 12/01/2016: B Natriuretic Peptide  124.6 12/13/2016: Magnesium 2.2 01/07/2017: Hemoglobin 10.9; Platelets 236 02/12/2017: BUN 60; Creatinine, Ser 2.11; Potassium 4.5; Sodium 142   Lipid Panel     Component Value Date/Time   CHOL 122 09/13/2016 1245   TRIG 168 (H) 09/13/2016 1245   HDL 36 (L) 09/13/2016 1245   CHOLHDL 3.4 09/13/2016 1245   VLDL 34 09/13/2016 1245   LDLCALC 52 09/13/2016 1245      Wt Readings from Last 3 Encounters:  02/17/17 (!) 304 lb 6.4 oz (138.1 kg)  02/12/17 299 lb 3.2 oz (135.7 kg)  01/22/17 (!) 302 lb 9.6 oz (137.3 kg)     Cardiac Studies Reviewed: 2D Echo 09/14/2016: Study Conclusions - Left ventricle: Septal and apical hypokinesis The cavity size was normal. Systolic function was mildly reduced. The estimated ejection fraction was in the range of 45% to 50%. Wall motion was normal; there were no regional wall motion abnormalities. Doppler parameters are consistent with both elevated ventricular end-diastolic filling pressure and elevated left atrial filling pressure. - Aortic valve: There was trivial regurgitation. Valve area (VTI): 1.38 cm^2. Valve area (Vmax): 1.24 cm^2. Valve area (Vmean): 1.27 cm^2. - Left atrium: The atrium was mildly dilated. - Atrial septum: No defect or patent foramen ovale was identified. - Pulmonary arteries: PA peak pressure: 38 mm Hg (S).  Cath 09-16-16: Conclusion   Ost 1st Mrg to 1st Mrg lesion, 50 %stenosed.  Prox LAD to Mid LAD lesion, 95 %stenosed.  Mid LAD lesion, 40 %stenosed.  Dist LAD lesion, 10 %stenosed.  Ost 2nd Mrg to 2nd Mrg lesion, 50 %stenosed.  Mid Cx lesion, 20 %stenosed.  1. Severe stenosis mid LAD 2. Moderate stenosis ostium of both OM1 and OM2.  3. Elevated filling pressures.   Recommendations: She will need staged PCI of the severe stenosis in the mid LAD given her renal insufficiency. Will hydrate gently next 24 hours. Will given one time  dose of IV Lasix tonight with elevated filling pressures. Will load  with Brilinta 180 mg po x 1 tonight then start Brilinta 90 mg po BID in the am. Continue ASA, statin and beta blocker. Resume IV heparin 6 hours post sheath pull. I will place her on the cath schedule for Friday 09/18/16.     PCI 09-18-2016: Conclusion   Mid LAD lesion, 40 %stenosed.  Dist LAD lesion, 50 %stenosed.  A STENT SYNERGY DES 3X32 drug eluting stent was successfully placed.  Prox LAD lesion, 95 %stenosed.  Post intervention, there is a 0% residual stenosis.  Successful PCI of an eccentric 95%, and tandem 40% and 50-60%stenosis with Angiosculpt scoring balloon, and ultimate DES stenting with a 3.032 mm Synergy DES stent postdilated to 3.26 mm with the entire region of stenosis being reduced to 0%.  LV pressure recording: 120/24  RECOMMENDATION: Ms Acero will be gently hydrated post procedure with her baseline renal insufficiency. LVEDP today was improved but elevated at 24, compared to 35 mmHg at diagnostic catheterization. DAPT for a minimum of 1 year and medical therapy for concomitant CAD.     ASSESSMENT AND PLAN: 1.  Coronary artery disease, native vessel, with angina: No change in anginal symptoms. Continue current medical therapy. Medications are reviewed today.  2. Chronic combined systolic and diastolic heart failure: The patient has continued shortness of breath with intermittent orthopnea. Her exam is suggestive of volume excess. I advised her to increase her morning Lasix dose to 80 mg.  3. Hypertension with chronic kidney disease stage III: The patient's most recent creatinine is 2.11. She is at high risk of progressive renal dysfunction in the context of congestive heart failure, morbid obesity, and uncontrolled diabetes. I think we should get her established with nephrology and a referral will be made today.  4. Type 2 diabetes: Uncontrolled, followed by her primary care physician. Lifestyle modification discussed.  Current medicines are reviewed with the  patient today.  The patient does not have concerns regarding medicines.  Labs/ tests ordered today include:   Orders Placed This Encounter  Procedures  . Basic Metabolic Panel (BMET)  . Ambulatory referral to Nephrology  . ECHOCARDIOGRAM COMPLETE    Disposition:   FU APP 4 weeks  Signed, Sherren Mocha, MD  02/17/2017 5:56 PM    [facility] [place], [place], [place]  [address] Phone: [phone]; Fax: [phone]

## 2017-02-17 NOTE — Patient Instructions (Signed)
Medication Instructions:  INCREASE Lasix (Furosemide) to 80 mg in the morning and 40 mg in the afternoon   Labwork: Your physician recommends that you return for lab work in: 2-3 weeks for BMET   Testing/Procedures: Your physician has requested that you have an echocardiogram. Echocardiography is a painless test that uses sound waves to create images of your heart. It provides your doctor with information about the size and shape of your heart and how well your heart's chambers and valves are working. This procedure takes approximately one hour. There are no restrictions for this procedure.   Follow-Up: You have been referred to Nephrology for Chronic Kidney Disease  Your physician recommends that you schedule a follow-up appointment in: 4 weeks with APP   If you need a refill on your cardiac medications before your next appointment, please call your pharmacy.   Thank you for choosing [facility]! Christen Bame, RN [phone]

## 2017-02-17 NOTE — Telephone Encounter (Signed)
Call placed to [phone] and left patient a message reminding her of her appointment with [facility] for her CPAP teaching at 10am.

## 2017-02-18 ENCOUNTER — Telehealth: Payer: Self-pay | Admitting: Family Medicine

## 2017-02-18 NOTE — Telephone Encounter (Signed)
The patient missed her CPAP teaching this morning.   This CM spoke to [facility] with [facility] Respiratory Therapy Department and the teaching was rescheduled for 02/26/17 @ 1000 @ [place].  Attempted to contact the patient to inform her of the new teaching appointment. . Call placed to # [phone] and a HIPAA compliant voicemail message was left requesting a call back to # [phone].

## 2017-02-18 NOTE — Telephone Encounter (Signed)
Pt called to try to reschedule the appt for the CPAP for next time please call her back

## 2017-02-18 NOTE — Telephone Encounter (Signed)
Patient called back would like to speak to [facility]

## 2017-02-19 NOTE — Telephone Encounter (Signed)
Attempted to contact the patient to to inform her of the CPAP teaching appointment. Call placed to # [phone] and a HIPAA compliant voicemail message was left requesting a call back to # [phone]

## 2017-02-22 ENCOUNTER — Telehealth: Payer: Self-pay | Admitting: Family Medicine

## 2017-02-22 NOTE — Telephone Encounter (Signed)
Call placed to [phone], and left a message for patient in regards to CPAP teaching. Informed patient that an appointment was made for her for this Friday 8/17 @ 10:00am. Asked for a call back to confirm about day and time at [phone].

## 2017-02-24 MED FILL — ISOSORBIDE MN ER 30 MG TAB: 30 | 30 days supply | Qty: 30 | Fill #2

## 2017-02-24 MED FILL — AMLODIPINE BESYLATE 5 MG TA: 5 | 30 days supply | Qty: 30 | Fill #5

## 2017-02-24 MED FILL — LISINOPRIL 10 MG TABLET: 10 | 30 days supply | Qty: 30 | Fill #1

## 2017-02-24 MED FILL — ?VALACYCLOVIR HCL 500MG TAB: 500 | 30 days supply | Qty: 30 | Fill #0

## 2017-02-25 ENCOUNTER — Telehealth: Payer: Self-pay

## 2017-02-25 NOTE — Telephone Encounter (Signed)
Pt was called and informed of lab results and lasix dose change.

## 2017-02-26 ENCOUNTER — Telehealth: Payer: Self-pay

## 2017-02-26 ENCOUNTER — Ambulatory Visit: Payer: Self-pay | Admitting: Family Medicine

## 2017-02-26 NOTE — Telephone Encounter (Signed)
The patient completed CPAP teaching with Phillis Knack, RT from [facility] Respiratory Therapy Department. She was instructed regarding use and care of the CPAP machine and was given the refurbished machine at the end of the teaching session.

## 2017-03-01 ENCOUNTER — Other Ambulatory Visit (HOSPITAL_COMMUNITY): Payer: Self-pay

## 2017-03-01 ENCOUNTER — Other Ambulatory Visit: Payer: Self-pay

## 2017-03-02 ENCOUNTER — Other Ambulatory Visit: Payer: Self-pay | Admitting: Physician Assistant

## 2017-03-02 DIAGNOSIS — Z9109 Other allergy status, other than to drugs and biological substances: Secondary | ICD-10-CM

## 2017-03-05 ENCOUNTER — Ambulatory Visit: Payer: Self-pay

## 2017-03-08 ENCOUNTER — Other Ambulatory Visit (HOSPITAL_COMMUNITY): Payer: Self-pay

## 2017-03-09 ENCOUNTER — Other Ambulatory Visit (HOSPITAL_COMMUNITY): Payer: Self-pay

## 2017-03-09 MED FILL — ALLOPURINOL 100 MG TABLET: 100 | 30 days supply | Qty: 30 | Fill #1

## 2017-03-10 ENCOUNTER — Ambulatory Visit (HOSPITAL_COMMUNITY): Payer: Medicaid Other | Attending: Cardiovascular Disease

## 2017-03-10 ENCOUNTER — Other Ambulatory Visit: Payer: Self-pay | Admitting: Physician Assistant

## 2017-03-10 ENCOUNTER — Ambulatory Visit
Admission: RE | Admit: 2017-03-10 | Discharge: 2017-03-10 | Disposition: A | Discharge status: Home / Self Care | Payer: Medicaid Other | Source: Ambulatory Visit | Attending: Family Medicine | Admitting: Family Medicine

## 2017-03-10 ENCOUNTER — Other Ambulatory Visit: Payer: Self-pay

## 2017-03-10 DIAGNOSIS — I351 Nonrheumatic aortic (valve) insufficiency: Secondary | ICD-10-CM | POA: Diagnosis not present

## 2017-03-10 DIAGNOSIS — Z9109 Other allergy status, other than to drugs and biological substances: Secondary | ICD-10-CM

## 2017-03-10 DIAGNOSIS — Z1231 Encounter for screening mammogram for malignant neoplasm of breast: Secondary | ICD-10-CM

## 2017-03-10 DIAGNOSIS — I5042 Chronic combined systolic (congestive) and diastolic (congestive) heart failure: Secondary | ICD-10-CM | POA: Insufficient documentation

## 2017-03-10 MED FILL — FERROUS SULFATE 325 MG TAB: 325 (65 FE) | 30 days supply | Qty: 60 | Fill #2

## 2017-03-11 ENCOUNTER — Other Ambulatory Visit: Payer: Self-pay | Admitting: Family Medicine

## 2017-03-11 DIAGNOSIS — R928 Other abnormal and inconclusive findings on diagnostic imaging of breast: Secondary | ICD-10-CM

## 2017-03-11 IMAGING — US US RENAL
1 series · 14 of 25 positions shown · non-contrast
Comparison: CT abdomen and pelvis 03/25/2012

CLINICAL DATA: Elevated creatinine. History of hypertension and
diabetes. Chronic kidney disease.

EXAM:
RENAL / URINARY TRACT ULTRASOUND COMPLETE

[Series 1: us renal · 0.24mm/px · 14 of 54 slices shown]
[im 1/54]
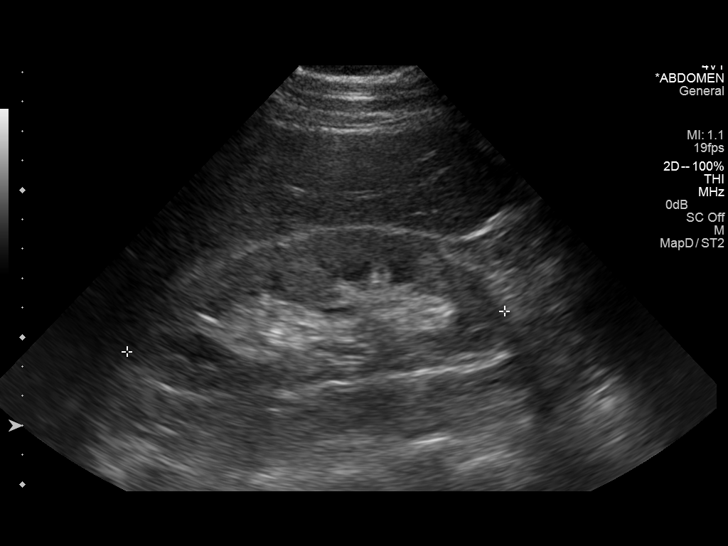
[im 5/54]
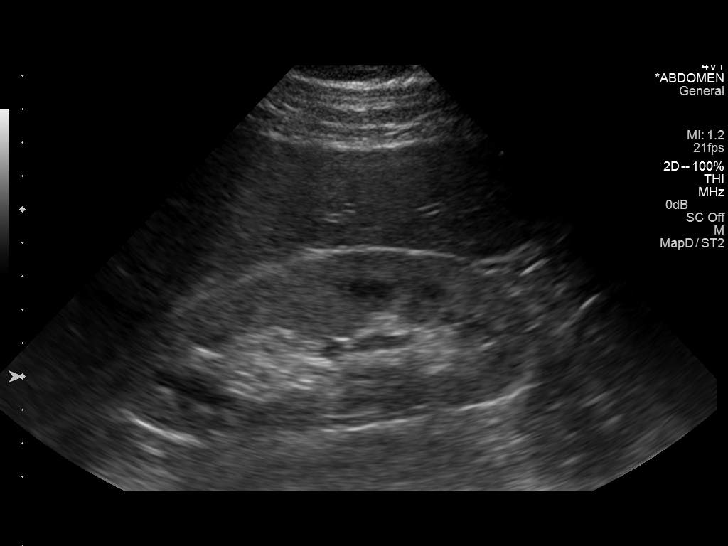
[im 9/54]
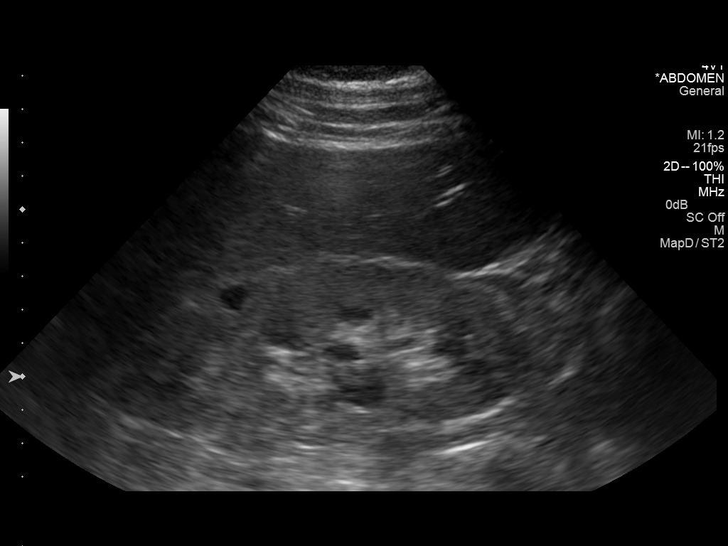
[im 14/54]
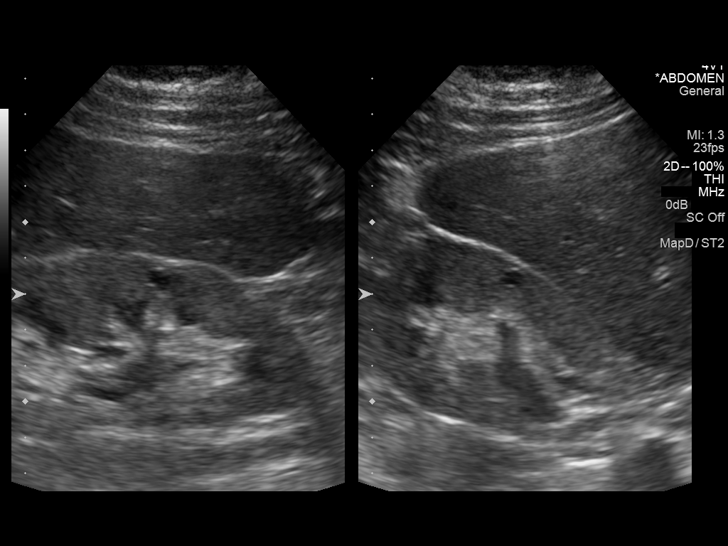
[im 18/54]
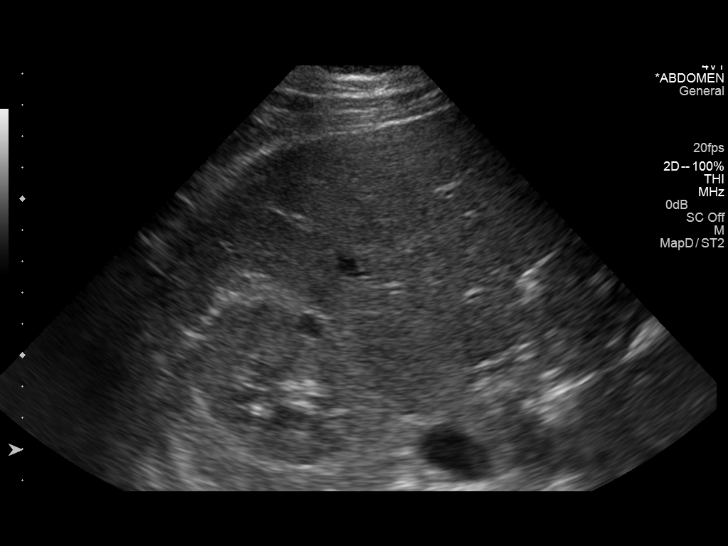
[im 20/54]
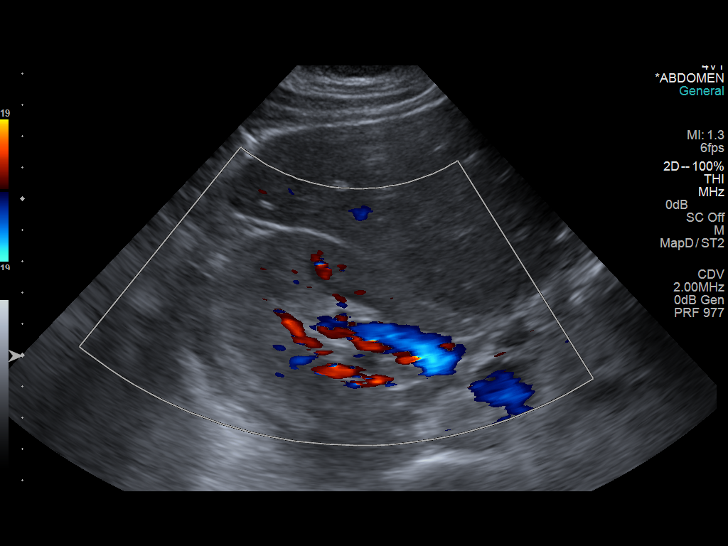
[im 25/54]
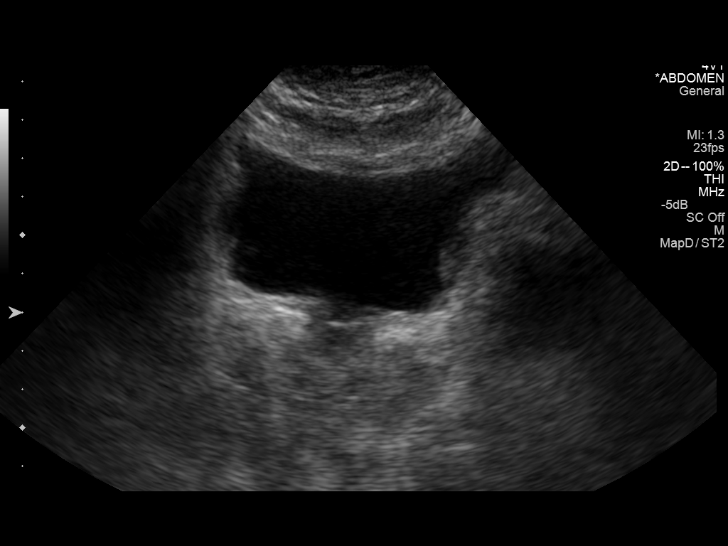
[im 29/54]
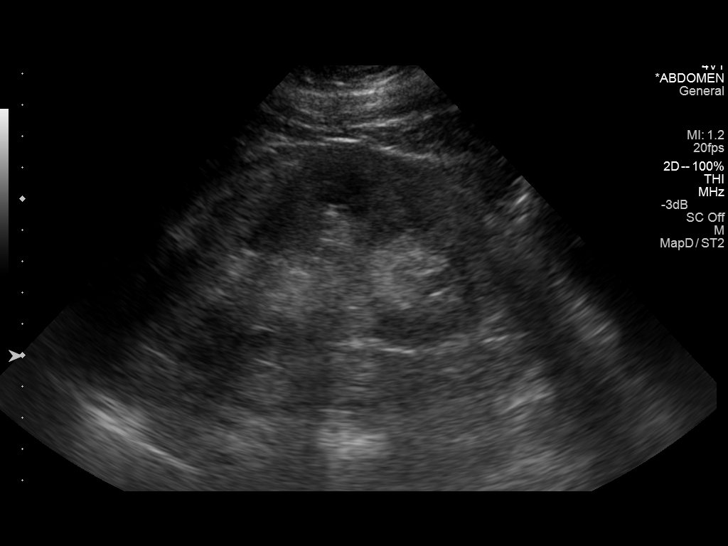
[im 34/54]
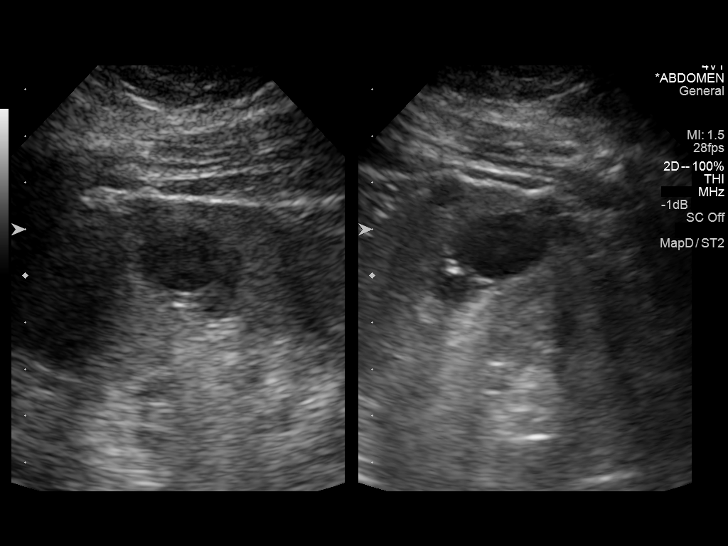
[im 36/54]
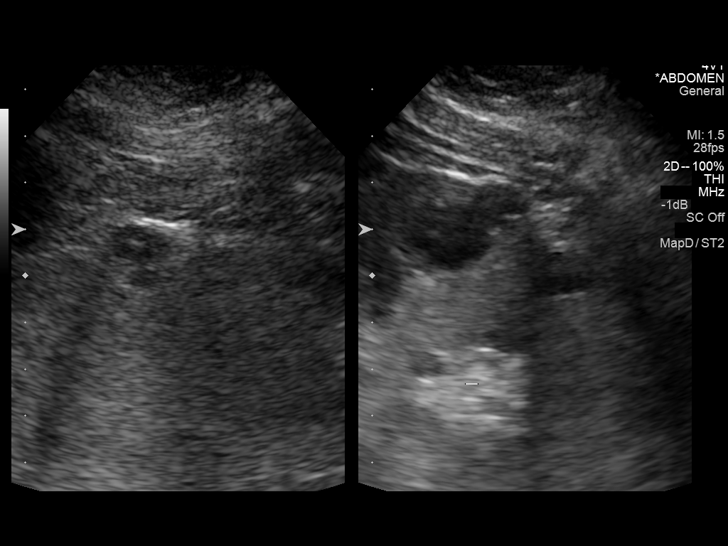
[im 40/54]
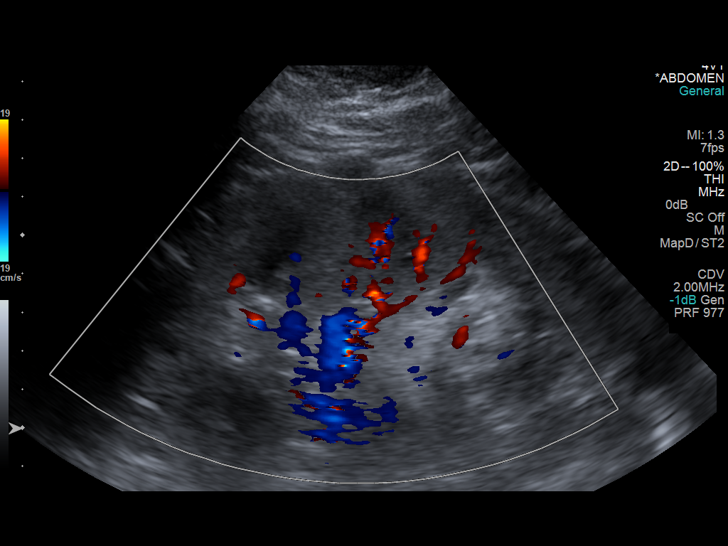
[im 45/54]
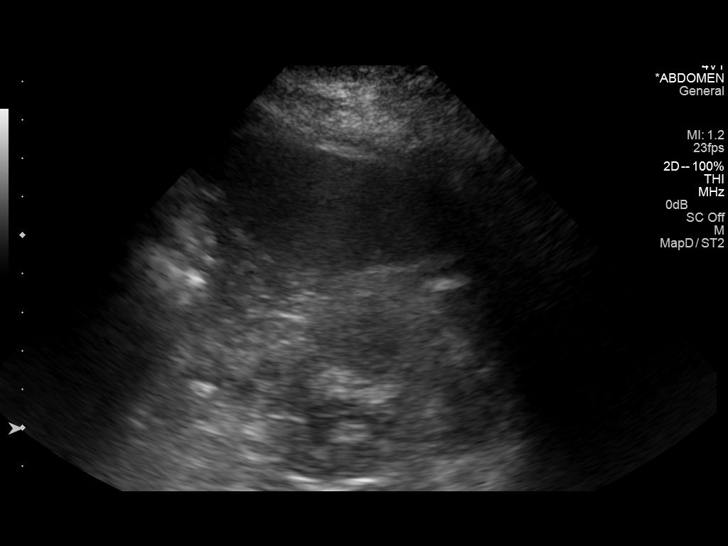
[im 49/54]
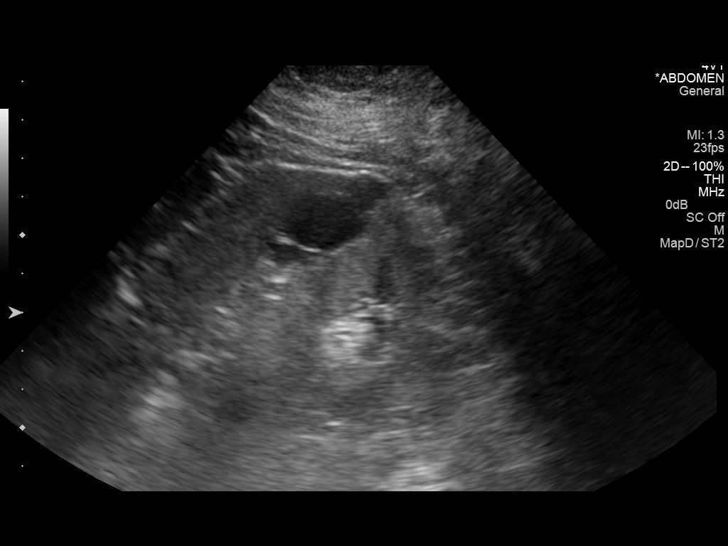
[im 54/54]
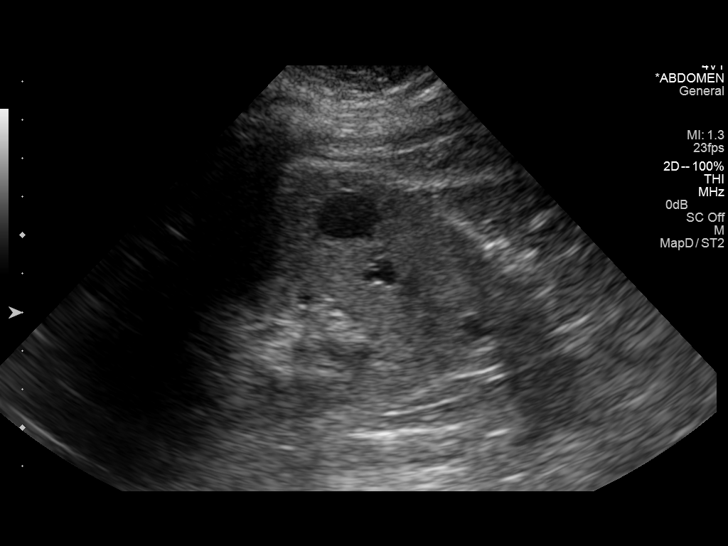

[14 of 25 positions shown; findings below may reference images not displayed]

FINDINGS: Right Kidney:

Length: 12.9 cm. Slightly increased parenchymal echogenicity. No
hydronephrosis or solid mass identified. 9 mm upper pole and 7 mm
lower pole cysts.

Left Kidney:

Length: 12.0 cm. Slightly increased parenchymal echogenicity. No
hydronephrosis or solid mass identified. Interpolar cysts measure
1.5 cm, 1.9 cm, and 1.0 cm. A subcentimeter lower pole cyst was also
noted.

Bladder:

Appears normal for degree of bladder distention.
IMPRESSION: 1. No hydronephrosis. Slightly increased parenchymal echogenicity
bilaterally suggestive of medical renal disease.
2. Small bilateral renal cysts.

## 2017-03-12 ENCOUNTER — Other Ambulatory Visit: Payer: Self-pay | Admitting: *Deleted

## 2017-03-12 MED ORDER — PREGABALIN 100 MG PO CAPS: 100.0000 mg | ORAL_CAPSULE | Freq: Three times a day (TID) | ORAL | 3 refills | Status: DC

## 2017-03-12 MED FILL — ACETAMINOPHEN/COD #3 TABLET: 300-30 | 20 days supply | Qty: 60 | Fill #1

## 2017-03-12 MED FILL — hydrOXYzine HCL 50 MG TABS: 50 | 30 days supply | Qty: 90 | Fill #0

## 2017-03-12 MED FILL — GABAPENTIN 100 MG CAPSULE: 100 | 30 days supply | Qty: 180 | Fill #2

## 2017-03-12 NOTE — Telephone Encounter (Signed)
PRINTED FOR PASS PROGRAM 

## 2017-03-16 ENCOUNTER — Encounter: Payer: Self-pay | Admitting: Physician Assistant

## 2017-03-16 ENCOUNTER — Other Ambulatory Visit: Payer: Self-pay | Admitting: Physician Assistant

## 2017-03-16 ENCOUNTER — Ambulatory Visit: Payer: Self-pay | Admitting: Physician Assistant

## 2017-03-16 ENCOUNTER — Other Ambulatory Visit: Payer: Self-pay | Admitting: Family Medicine

## 2017-03-16 DIAGNOSIS — E118 Type 2 diabetes mellitus with unspecified complications: Principal | ICD-10-CM

## 2017-03-16 DIAGNOSIS — Z794 Long term (current) use of insulin: Principal | ICD-10-CM

## 2017-03-16 DIAGNOSIS — I350 Nonrheumatic aortic (valve) stenosis: Secondary | ICD-10-CM | POA: Insufficient documentation

## 2017-03-16 DIAGNOSIS — I10 Essential (primary) hypertension: Secondary | ICD-10-CM

## 2017-03-16 DIAGNOSIS — E1165 Type 2 diabetes mellitus with hyperglycemia: Secondary | ICD-10-CM

## 2017-03-16 DIAGNOSIS — IMO0002 Reserved for concepts with insufficient information to code with codable children: Secondary | ICD-10-CM

## 2017-03-16 MED FILL — LISINOPRIL 10 MG TABS: 10 | 30 days supply | Qty: 30 | Fill #0

## 2017-03-16 MED FILL — CARVEDILOL 25 MG TABLET: 25 | 30 days supply | Qty: 60 | Fill #1

## 2017-03-16 MED FILL — $LANTUS SOLOSTAR 100 UNITS/: 100 | 30 days supply | Qty: 24 | Fill #1

## 2017-03-16 MED FILL — ISOSORBIDE MN ER 30 MG TAB: 30 | 30 days supply | Qty: 30 | Fill #3

## 2017-03-16 MED FILL — ?OMEPRAZOLE DR 20 MG CAPSUL: 20 | 30 days supply | Qty: 30 | Fill #1

## 2017-03-16 MED FILL — AMLODIPINE BESYLATE 5 MG TA: 5 | 30 days supply | Qty: 30 | Fill #6

## 2017-03-16 MED FILL — FUROSEMIDE 40 MG TABLET: 40 | 30 days supply | Qty: 90 | Fill #1

## 2017-03-16 NOTE — Progress Notes (Deleted)
Cardiology Office Note:    Date:  03/16/2017   ID:  [name], DOB July 18, 1955, MRN [id number]  PCP:  Boykin Nearing, MD  Cardiologist:  Dr. Sherren Mocha    Referring MD: Boykin Nearing, MD   No chief complaint on file. ***  History of Present Illness:    [name] is a 61 y.o. female with a hx of CAD, combined systolic and diastolic HF, CKD. She suffered a non-STEMI in 6/06 complicated by acute heart failure. She ultimately underwent DES to the LAD. Last seen by Dr. Burt Knack 02/17/17. She was volume overloaded and her furosemide dose was adjusted. She was also referred to nephrology. Follow-up echocardiogram 03/10/17 demonstrated normal LV function, mild diastolic dysfunction and mild aortic stenosis. Repeat echocardiogram plan 1.  [name] ***  Prior CV studies:   The following studies were reviewed today:  Echocardiogram 03/10/17 EF 60-65, normal wall motion, grade 1 diastolic dysfunction, calcified aortic valve leaflets, mild aortic stenosis (mean 13, peak 28, LVOT/AV mean velocity 0.51), mild AI, moderate MAC, mild LAE, trivial TR  Percutaneous Coronary Intervention 09/18/16 LAD proximal 95, mid 40, distal 50 PCI:  3 x 32 mm Synergy DES to LAD Post-Intervention Diagram    Cardiac Catheterization 09/16/16 Proximal 95, mid 40, distal 10 LCx mid 20, OM1 50, OM2 50  Echocardiogram 09/14/16 Septal and apical HK, EF 45-50, normal wall motion, trivial AI, mild LAE, PASP 38  Renal artery [place] 09/14/16 Summary: Findings suggest 1-59% right renal artery stenosis. Unable to adequately evaluate the left renal artery due to technical Limitations.  Echocardiogram 09/04/16 EF 55-60, normal wall motion, grade 2 diastolic dysfunction, trivial AI  Past Medical History:  Diagnosis Date  . Aortic stenosis    Echo 8/18: mean 13, peak 28, LVOT/AV mean velocity 0.51  . Asthma    As a child   . Bronchitis   . CAD (coronary artery disease)    a. 09/2016: 50% Ost 1st Mrg stenosis, 50% 2nd  Mrg stenosis, 20% Mid-Cx, 95% Prox LAD, 40% mid-LAD, and 10% dist-LAD stenosis. Staged PCI with DES to Prox-LAD.   [name] Chronic combined systolic and diastolic CHF (congestive heart failure) ([place]) 2011   echo 2/18: EF 55-60, normal wall motion, grade 2 diastolic dysfunction, trivial AI // echo 3/18: Septal and apical HK, EF 45-50, normal wall motion, trivial AI, mild LAE, PASP 38 // echo 8/18: EF 60-65, normal wall motion, grade 1 diastolic dysfunction, calcified aortic valve leaflets, mild aortic stenosis (mean 13, peak 28, LVOT/AV mean velocity 0.51), mild AI, moderate MAC, mild LAE, trivial TR   . Complication of anesthesia   . Diabetes mellitus Dx 1989  . Hepatitis C Dx 2013  . Hypertension Dx 1989  . Obesity   . Pancreatitis 2013  . Refusal of blood transfusions as patient is Jehovah's Witness   . Ulcer 2010    Past Surgical History:  Procedure Laterality Date  . CHOLECYSTECTOMY    . CORONARY STENT INTERVENTION N/A 09/18/2016   Procedure: Coronary Stent Intervention;  Surgeon: Troy Sine, MD;  Location: [place] CV LAB;  Service: Cardiovascular;  Laterality: N/A;  . EYE SURGERY    . KNEE ARTHROSCOPY    . LEFT HEART CATH N/A 09/18/2016   Procedure: Left Heart Cath;  Surgeon: Troy Sine, MD;  Location: [place] CV [facility];  Service: Cardiovascular;  Laterality: N/A;  . LEFT HEART CATH AND CORONARY ANGIOGRAPHY N/A 09/16/2016   Procedure: Left Heart Cath and Coronary Angiography;  Surgeon:  Burnell Blanks, MD;  Location: [place] CV [facility];  Service: Cardiovascular;  Laterality: N/A;  . TUBAL LIGATION    . TUBAL LIGATION      Current Medications: No outpatient prescriptions have been marked as taking for the 03/16/17 encounter (Appointment) with Richardson Dopp T, PA-C.     Allergies:   Shellfish allergy; Diazepam; and Morphine and related   Social History   Social History  . Marital status: Divorced    Spouse name: N/A  . Number of children: N/A  . Years of education:  N/A   Social History Main Topics  . Smoking status: Former Smoker    Quit date: 10/25/1980  . Smokeless tobacco: Never Used     Comment: quit smoking in 1982  . Alcohol use Yes     Comment: 3 times in last year  . Drug use: No     Comment: 08/21/2016 "clean since 05/1998"  . Sexual activity: Not on file     Comment: Not asked   Other Topics Concern  . Not on file   Social History Narrative  . No narrative on file     Family Hx: The patient's family history includes Colon cancer in her mother; Diabetes in her paternal grandmother; Heart attack in her maternal grandmother and other; Hypertension in her brother and sister. There is no history of Breast cancer.  ROS:   Please see the history of present illness.    ROS All other systems reviewed and are negative.   EKGs/Labs/Other Test Reviewed:    EKG:  EKG is *** ordered today.  The ekg ordered today demonstrates ***  Recent Labs: 09/13/2016: TSH 1.860 11/10/2016: NT-Pro BNP 202 11/17/2016: ALT 31 12/01/2016: B Natriuretic Peptide 124.6 12/13/2016: Magnesium 2.2 01/07/2017: Hemoglobin 10.9; Platelets 236 02/12/2017: BUN 60; Creatinine, Ser 2.11; Potassium 4.5; Sodium 142   Recent Lipid Panel Lab Results  Component Value Date/Time   CHOL 122 09/13/2016 12:45 PM   TRIG 168 (H) 09/13/2016 12:45 PM   HDL 36 (L) 09/13/2016 12:45 PM   CHOLHDL 3.4 09/13/2016 12:45 PM   LDLCALC 52 09/13/2016 12:45 PM    Physical Exam:    VS:  There were no vitals taken for this visit.    Wt Readings from Last 3 Encounters:  02/17/17 (!) 304 lb 6.4 oz (138.1 kg)  02/12/17 299 lb 3.2 oz (135.7 kg)  01/22/17 (!) 302 lb 9.6 oz (137.3 kg)     ***Physical Exam  ASSESSMENT:    1. Chronic combined systolic and diastolic CHF (congestive heart failure) ([place])   2. Coronary artery disease involving native coronary artery of native heart without angina pectoris   3. CKD (chronic kidney disease) stage 4, GFR 15-29 ml/min (HCC)   4. Nonrheumatic aortic  valve stenosis   5. Essential hypertension    PLAN:    In order of problems listed above:  Chronic combined systolic and diastolic CHF (congestive heart failure) (HCC)  Coronary artery disease involving native coronary artery of native heart without angina pectoris  CKD (chronic kidney disease) stage 4, GFR 15-29 ml/min (HCC)  Nonrheumatic aortic valve stenosis  Essential hypertension***  Dispo:  No Follow-up on file.   Medication Adjustments/Labs and Tests Ordered: Current medicines are reviewed at length with the patient today.  Concerns regarding medicines are outlined above.  Tests Ordered: No orders of the defined types were placed in this encounter.  Medication Changes: No orders of the defined types were placed in this encounter.   Signed,  Richardson Dopp, PA-C  03/16/2017 8:26 AM    [facility] [address], [place], [place]  [address] Phone: [phone]; Fax: [phone]

## 2017-03-17 ENCOUNTER — Inpatient Hospital Stay (HOSPITAL_COMMUNITY): Payer: Medicaid Other

## 2017-03-17 ENCOUNTER — Encounter (HOSPITAL_COMMUNITY): Payer: Self-pay | Admitting: *Deleted

## 2017-03-17 ENCOUNTER — Ambulatory Visit (INDEPENDENT_AMBULATORY_CARE_PROVIDER_SITE_OTHER): Payer: Self-pay | Admitting: Physician Assistant

## 2017-03-17 ENCOUNTER — Inpatient Hospital Stay (HOSPITAL_COMMUNITY)
Admission: EM | Admit: 2017-03-17 | Discharge: 2017-03-19 | DRG: 302 | Disposition: A | Discharge status: Home / Self Care | Payer: Medicaid Other | Attending: Cardiology | Admitting: Cardiology

## 2017-03-17 ENCOUNTER — Encounter: Payer: Self-pay | Admitting: Physician Assistant

## 2017-03-17 ENCOUNTER — Emergency Department (HOSPITAL_COMMUNITY): Payer: Medicaid Other

## 2017-03-17 ENCOUNTER — Inpatient Hospital Stay: Admission: AD | Admit: 2017-03-17 | Payer: Medicaid Other | Source: Ambulatory Visit | Admitting: Cardiovascular Disease

## 2017-03-17 VITALS — BP 122/50 | HR 78 | Ht 68.0 in | Wt 297.0 lb

## 2017-03-17 DIAGNOSIS — I10 Essential (primary) hypertension: Secondary | ICD-10-CM | POA: Diagnosis present

## 2017-03-17 DIAGNOSIS — E1122 Type 2 diabetes mellitus with diabetic chronic kidney disease: Secondary | ICD-10-CM | POA: Diagnosis present

## 2017-03-17 DIAGNOSIS — IMO0002 Reserved for concepts with insufficient information to code with codable children: Secondary | ICD-10-CM

## 2017-03-17 DIAGNOSIS — Z6841 Body Mass Index (BMI) 40.0 and over, adult: Secondary | ICD-10-CM

## 2017-03-17 DIAGNOSIS — Z7982 Long term (current) use of aspirin: Secondary | ICD-10-CM

## 2017-03-17 DIAGNOSIS — N184 Chronic kidney disease, stage 4 (severe): Secondary | ICD-10-CM | POA: Diagnosis present

## 2017-03-17 DIAGNOSIS — R339 Retention of urine, unspecified: Secondary | ICD-10-CM | POA: Diagnosis present

## 2017-03-17 DIAGNOSIS — A6000 Herpesviral infection of urogenital system, unspecified: Secondary | ICD-10-CM | POA: Diagnosis present

## 2017-03-17 DIAGNOSIS — I5043 Acute on chronic combined systolic (congestive) and diastolic (congestive) heart failure: Secondary | ICD-10-CM | POA: Diagnosis present

## 2017-03-17 DIAGNOSIS — E1165 Type 2 diabetes mellitus with hyperglycemia: Secondary | ICD-10-CM | POA: Diagnosis present

## 2017-03-17 DIAGNOSIS — N183 Chronic kidney disease, stage 3 unspecified: Secondary | ICD-10-CM | POA: Diagnosis present

## 2017-03-17 DIAGNOSIS — D631 Anemia in chronic kidney disease: Secondary | ICD-10-CM | POA: Diagnosis present

## 2017-03-17 DIAGNOSIS — I5033 Acute on chronic diastolic (congestive) heart failure: Secondary | ICD-10-CM | POA: Diagnosis present

## 2017-03-17 DIAGNOSIS — I350 Nonrheumatic aortic (valve) stenosis: Secondary | ICD-10-CM

## 2017-03-17 DIAGNOSIS — Z833 Family history of diabetes mellitus: Secondary | ICD-10-CM

## 2017-03-17 DIAGNOSIS — Z8249 Family history of ischemic heart disease and other diseases of the circulatory system: Secondary | ICD-10-CM

## 2017-03-17 DIAGNOSIS — I25110 Atherosclerotic heart disease of native coronary artery with unstable angina pectoris: Secondary | ICD-10-CM

## 2017-03-17 DIAGNOSIS — I130 Hypertensive heart and chronic kidney disease with heart failure and stage 1 through stage 4 chronic kidney disease, or unspecified chronic kidney disease: Secondary | ICD-10-CM | POA: Diagnosis present

## 2017-03-17 DIAGNOSIS — J4530 Mild persistent asthma, uncomplicated: Secondary | ICD-10-CM | POA: Diagnosis present

## 2017-03-17 DIAGNOSIS — N179 Acute kidney failure, unspecified: Secondary | ICD-10-CM

## 2017-03-17 DIAGNOSIS — Z79899 Other long term (current) drug therapy: Secondary | ICD-10-CM

## 2017-03-17 DIAGNOSIS — Z888 Allergy status to other drugs, medicaments and biological substances status: Secondary | ICD-10-CM

## 2017-03-17 DIAGNOSIS — M1039 Gout due to renal impairment, multiple sites: Secondary | ICD-10-CM | POA: Diagnosis present

## 2017-03-17 DIAGNOSIS — I252 Old myocardial infarction: Secondary | ICD-10-CM

## 2017-03-17 DIAGNOSIS — R0609 Other forms of dyspnea: Secondary | ICD-10-CM

## 2017-03-17 DIAGNOSIS — G4733 Obstructive sleep apnea (adult) (pediatric): Secondary | ICD-10-CM | POA: Diagnosis present

## 2017-03-17 DIAGNOSIS — I200 Unstable angina: Secondary | ICD-10-CM

## 2017-03-17 DIAGNOSIS — Z794 Long term (current) use of insulin: Secondary | ICD-10-CM

## 2017-03-17 DIAGNOSIS — Z7951 Long term (current) use of inhaled steroids: Secondary | ICD-10-CM

## 2017-03-17 DIAGNOSIS — R0602 Shortness of breath: Secondary | ICD-10-CM | POA: Diagnosis present

## 2017-03-17 DIAGNOSIS — K219 Gastro-esophageal reflux disease without esophagitis: Secondary | ICD-10-CM | POA: Diagnosis present

## 2017-03-17 DIAGNOSIS — Z91013 Allergy to seafood: Secondary | ICD-10-CM

## 2017-03-17 DIAGNOSIS — I509 Heart failure, unspecified: Secondary | ICD-10-CM

## 2017-03-17 DIAGNOSIS — Z87891 Personal history of nicotine dependence: Secondary | ICD-10-CM

## 2017-03-17 DIAGNOSIS — M10041 Idiopathic gout, right hand: Secondary | ICD-10-CM | POA: Diagnosis present

## 2017-03-17 DIAGNOSIS — Z955 Presence of coronary angioplasty implant and graft: Secondary | ICD-10-CM

## 2017-03-17 DIAGNOSIS — Z885 Allergy status to narcotic agent status: Secondary | ICD-10-CM | POA: Diagnosis not present

## 2017-03-17 DIAGNOSIS — E118 Type 2 diabetes mellitus with unspecified complications: Secondary | ICD-10-CM

## 2017-03-17 LAB — CBC WITH DIFFERENTIAL/PLATELET
Basophils Absolute: 0 10*3/uL (ref 0.0–0.1)
Basophils Relative: 0 %
Eosinophils Absolute: 0.2 10*3/uL (ref 0.0–0.7)
Eosinophils Relative: 2 %
HCT: 31.2 % — ABNORMAL LOW (ref 36.0–46.0)
Hemoglobin: 10.2 g/dL — ABNORMAL LOW (ref 12.0–15.0)
Lymphocytes Relative: 21 %
Lymphs Abs: 2 10*3/uL (ref 0.7–4.0)
MCH: 27.5 pg (ref 26.0–34.0)
MCHC: 32.7 g/dL (ref 30.0–36.0)
MCV: 84.1 fL (ref 78.0–100.0)
Monocytes Absolute: 0.5 10*3/uL (ref 0.1–1.0)
Monocytes Relative: 5 %
Neutro Abs: 6.9 10*3/uL (ref 1.7–7.7)
Neutrophils Relative %: 72 %
Platelets: 210 10*3/uL (ref 150–400)
RBC: 3.71 MIL/uL — ABNORMAL LOW (ref 3.87–5.11)
RDW: 16.1 % — ABNORMAL HIGH (ref 11.5–15.5)
WBC: 9.6 10*3/uL (ref 4.0–10.5)

## 2017-03-17 LAB — BASIC METABOLIC PANEL
Anion gap: 12 (ref 5–15)
BUN: 77 mg/dL — ABNORMAL HIGH (ref 6–20)
CO2: 21 mmol/L — ABNORMAL LOW (ref 22–32)
Calcium: 8.9 mg/dL (ref 8.9–10.3)
Chloride: 104 mmol/L (ref 101–111)
Creatinine, Ser: 2.81 mg/dL — ABNORMAL HIGH (ref 0.44–1.00)
GFR calc Af Amer: 20 mL/min — ABNORMAL LOW (ref >60)
GFR calc non Af Amer: 17 mL/min — ABNORMAL LOW (ref >60)
Glucose, Bld: 352 mg/dL — ABNORMAL HIGH (ref 65–99)
Potassium: 4.2 mmol/L (ref 3.5–5.1)
Sodium: 137 mmol/L (ref 135–145)

## 2017-03-17 LAB — TROPONIN I
Troponin I: <0.03 ng/mL (ref <0.03)
Troponin I: <0.03 ng/mL (ref <0.03)

## 2017-03-17 LAB — COMPREHENSIVE METABOLIC PANEL
ALT: 25 U/L (ref 14–54)
AST: 16 U/L (ref 15–41)
Albumin: 3.6 g/dL (ref 3.5–5.0)
Alkaline Phosphatase: 149 U/L — ABNORMAL HIGH (ref 38–126)
Anion gap: 13 (ref 5–15)
BUN: 77 mg/dL — ABNORMAL HIGH (ref 6–20)
CO2: 21 mmol/L — ABNORMAL LOW (ref 22–32)
Calcium: 8.9 mg/dL (ref 8.9–10.3)
Chloride: 105 mmol/L (ref 101–111)
Creatinine, Ser: 2.87 mg/dL — ABNORMAL HIGH (ref 0.44–1.00)
GFR calc Af Amer: 20 mL/min — ABNORMAL LOW (ref >60)
GFR calc non Af Amer: 17 mL/min — ABNORMAL LOW (ref >60)
Glucose, Bld: 217 mg/dL — ABNORMAL HIGH (ref 65–99)
Potassium: 3.9 mmol/L (ref 3.5–5.1)
Sodium: 139 mmol/L (ref 135–145)
Total Bilirubin: 0.5 mg/dL (ref 0.3–1.2)
Total Protein: 7.5 g/dL (ref 6.5–8.1)

## 2017-03-17 LAB — PROTIME-INR
INR: 1.13
Prothrombin Time: 14.5 seconds (ref 11.4–15.2)

## 2017-03-17 LAB — CBC
HCT: 32.1 % — ABNORMAL LOW (ref 36.0–46.0)
Hemoglobin: 10.2 g/dL — ABNORMAL LOW (ref 12.0–15.0)
MCH: 26.9 pg (ref 26.0–34.0)
MCHC: 31.8 g/dL (ref 30.0–36.0)
MCV: 84.7 fL (ref 78.0–100.0)
Platelets: 233 10*3/uL (ref 150–400)
RBC: 3.79 MIL/uL — ABNORMAL LOW (ref 3.87–5.11)
RDW: 16.2 % — ABNORMAL HIGH (ref 11.5–15.5)
WBC: 10 10*3/uL (ref 4.0–10.5)

## 2017-03-17 LAB — GLUCOSE, CAPILLARY: Glucose-Capillary: 242 mg/dL — ABNORMAL HIGH (ref 65–99)

## 2017-03-17 LAB — I-STAT TROPONIN, ED: Troponin i, poc: 0 ng/mL (ref 0.00–0.08)

## 2017-03-17 LAB — HEPARIN LEVEL (UNFRACTIONATED): Heparin Unfractionated: 0.26 IU/mL — ABNORMAL LOW (ref 0.30–0.70)

## 2017-03-17 LAB — APTT: aPTT: 111 seconds — ABNORMAL HIGH (ref 24–36)

## 2017-03-17 LAB — BRAIN NATRIURETIC PEPTIDE: B Natriuretic Peptide: 18.8 pg/mL (ref 0.0–100.0)

## 2017-03-17 MED ORDER — PANTOPRAZOLE SODIUM 40 MG PO TBEC
40.0000 mg | DELAYED_RELEASE_TABLET | Freq: Every day | ORAL | Status: DC
  Administered 2017-03-18 – 2017-03-19 (×2): 40 mg via ORAL
  Filled 2017-03-17 (×3): qty 1

## 2017-03-17 MED ORDER — CARVEDILOL 25 MG PO TABS
25.0000 mg | ORAL_TABLET | Freq: Two times a day (BID) | ORAL | Status: DC
  Administered 2017-03-17 – 2017-03-19 (×4): 25 mg via ORAL
  Filled 2017-03-17 (×4): qty 1

## 2017-03-17 MED ORDER — FUROSEMIDE 10 MG/ML IJ SOLN
80.0000 mg | Freq: Once | INTRAMUSCULAR | Status: AC
  Administered 2017-03-17: 80 mg via INTRAVENOUS
  Filled 2017-03-17: qty 8

## 2017-03-17 MED ORDER — VALACYCLOVIR HCL 500 MG PO TABS
500.0000 mg | ORAL_TABLET | Freq: Every day | ORAL | Status: DC
  Administered 2017-03-18 – 2017-03-19 (×2): 500 mg via ORAL
  Filled 2017-03-17 (×2): qty 1

## 2017-03-17 MED ORDER — PANTOPRAZOLE SODIUM 40 MG PO TBEC
40.0000 mg | DELAYED_RELEASE_TABLET | Freq: Every day | ORAL | Status: DC
Start: 2017-03-17 — End: 2017-03-17

## 2017-03-17 MED ORDER — ACETAMINOPHEN 325 MG PO TABS: 650.0000 mg | ORAL_TABLET | ORAL | Status: DC | PRN

## 2017-03-17 MED ORDER — MOMETASONE FURO-FORMOTEROL FUM 200-5 MCG/ACT IN AERO
2.0000 | INHALATION_SPRAY | Freq: Two times a day (BID) | RESPIRATORY_TRACT | Status: DC
  Administered 2017-03-18 – 2017-03-19 (×3): 2 via RESPIRATORY_TRACT
  Filled 2017-03-17: qty 8.8

## 2017-03-17 MED ORDER — INSULIN ASPART 100 UNIT/ML ~~LOC~~ SOLN
25.0000 [IU] | Freq: Three times a day (TID) | SUBCUTANEOUS | Status: DC
  Administered 2017-03-18 – 2017-03-19 (×5): 25 [IU] via SUBCUTANEOUS

## 2017-03-17 MED ORDER — SODIUM CHLORIDE 0.9% FLUSH: 3.0000 mL | INTRAVENOUS | Status: DC | PRN

## 2017-03-17 MED ORDER — FAMOTIDINE 20 MG PO TABS
20.0000 mg | ORAL_TABLET | Freq: Two times a day (BID) | ORAL | Status: DC
  Administered 2017-03-17 – 2017-03-19 (×4): 20 mg via ORAL
  Filled 2017-03-17 (×4): qty 1

## 2017-03-17 MED ORDER — ACETAMINOPHEN-CODEINE #3 300-30 MG PO TABS
1.0000 | ORAL_TABLET | Freq: Three times a day (TID) | ORAL | Status: DC | PRN
  Administered 2017-03-19: 1 via ORAL
  Filled 2017-03-17: qty 1

## 2017-03-17 MED ORDER — COLCHICINE 0.6 MG PO TABS
0.6000 mg | ORAL_TABLET | Freq: Every day | ORAL | Status: DC
  Administered 2017-03-17 – 2017-03-19 (×3): 0.6 mg via ORAL
  Filled 2017-03-17 (×3): qty 1

## 2017-03-17 MED ORDER — SODIUM CHLORIDE 0.9 % IV SOLN: 250.0000 mL | INTRAVENOUS | Status: DC | PRN

## 2017-03-17 MED ORDER — TECHNETIUM TC 99M DIETHYLENETRIAME-PENTAACETIC ACID: 30.0000 | Freq: Once | INTRAVENOUS | Status: DC | PRN

## 2017-03-17 MED ORDER — TICAGRELOR 90 MG PO TABS
90.0000 mg | ORAL_TABLET | Freq: Two times a day (BID) | ORAL | Status: DC
  Administered 2017-03-17 – 2017-03-19 (×4): 90 mg via ORAL
  Filled 2017-03-17 (×4): qty 1

## 2017-03-17 MED ORDER — HYDRALAZINE HCL 50 MG PO TABS
50.0000 mg | ORAL_TABLET | Freq: Three times a day (TID) | ORAL | Status: DC
  Administered 2017-03-17 – 2017-03-19 (×5): 50 mg via ORAL
  Filled 2017-03-17 (×5): qty 1

## 2017-03-17 MED ORDER — ALBUTEROL SULFATE HFA 108 (90 BASE) MCG/ACT IN AERS: 2.0000 | INHALATION_SPRAY | RESPIRATORY_TRACT | Status: DC | PRN

## 2017-03-17 MED ORDER — GABAPENTIN 100 MG PO CAPS
200.0000 mg | ORAL_CAPSULE | Freq: Three times a day (TID) | ORAL | Status: DC
  Administered 2017-03-17 – 2017-03-19 (×5): 200 mg via ORAL
  Filled 2017-03-17 (×5): qty 2

## 2017-03-17 MED ORDER — HEPARIN (PORCINE) IN NACL 100-0.45 UNIT/ML-% IJ SOLN
1700.0000 [IU]/h | INTRAMUSCULAR | Status: DC
  Administered 2017-03-17: 1600 [IU]/h via INTRAVENOUS
  Administered 2017-03-18 – 2017-03-19 (×2): 1800 [IU]/h via INTRAVENOUS
  Administered 2017-03-19: 1700 [IU]/h via INTRAVENOUS
  Filled 2017-03-17 (×4): qty 250

## 2017-03-17 MED ORDER — AMLODIPINE BESYLATE 5 MG PO TABS
5.0000 mg | ORAL_TABLET | Freq: Every day | ORAL | Status: DC
  Administered 2017-03-17 – 2017-03-19 (×3): 5 mg via ORAL
  Filled 2017-03-17 (×3): qty 1

## 2017-03-17 MED ORDER — FLUTICASONE PROPIONATE 50 MCG/ACT NA SUSP
2.0000 | Freq: Every day | NASAL | Status: DC | PRN
  Filled 2017-03-17: qty 16

## 2017-03-17 MED ORDER — HYDROXYZINE HCL 25 MG PO TABS
50.0000 mg | ORAL_TABLET | Freq: Three times a day (TID) | ORAL | Status: DC | PRN
Start: 2017-03-17 — End: 2017-03-19

## 2017-03-17 MED ORDER — PREGABALIN 50 MG PO CAPS
100.0000 mg | ORAL_CAPSULE | Freq: Three times a day (TID) | ORAL | Status: DC
  Administered 2017-03-17 – 2017-03-19 (×5): 100 mg via ORAL
  Filled 2017-03-17 (×5): qty 2

## 2017-03-17 MED ORDER — KETOTIFEN FUMARATE 0.025 % OP SOLN
1.0000 [drp] | Freq: Every day | OPHTHALMIC | Status: DC | PRN
  Filled 2017-03-17: qty 5

## 2017-03-17 MED ORDER — SODIUM CHLORIDE 0.9% FLUSH
3.0000 mL | Freq: Two times a day (BID) | INTRAVENOUS | Status: DC
  Administered 2017-03-19: 3 mL via INTRAVENOUS

## 2017-03-17 MED ORDER — ALBUTEROL SULFATE (2.5 MG/3ML) 0.083% IN NEBU: 2.5000 mg | INHALATION_SOLUTION | Freq: Four times a day (QID) | RESPIRATORY_TRACT | Status: DC | PRN

## 2017-03-17 MED ORDER — LINAGLIPTIN 5 MG PO TABS
5.0000 mg | ORAL_TABLET | Freq: Every day | ORAL | Status: DC
  Administered 2017-03-18 – 2017-03-19 (×2): 5 mg via ORAL
  Filled 2017-03-17 (×2): qty 1

## 2017-03-17 MED ORDER — ALLOPURINOL 100 MG PO TABS
100.0000 mg | ORAL_TABLET | Freq: Every day | ORAL | Status: DC
  Administered 2017-03-17 – 2017-03-19 (×3): 100 mg via ORAL
  Filled 2017-03-17 (×3): qty 1

## 2017-03-17 MED ORDER — HEPARIN BOLUS VIA INFUSION
4000.0000 [IU] | Freq: Once | INTRAVENOUS | Status: AC
  Administered 2017-03-17: 4000 [IU] via INTRAVENOUS
  Filled 2017-03-17: qty 4000

## 2017-03-17 MED ORDER — FERROUS SULFATE 325 (65 FE) MG PO TABS
325.0000 mg | ORAL_TABLET | Freq: Two times a day (BID) | ORAL | Status: DC
  Administered 2017-03-17 – 2017-03-19 (×3): 325 mg via ORAL
  Filled 2017-03-17 (×4): qty 1

## 2017-03-17 MED ORDER — ATORVASTATIN CALCIUM 80 MG PO TABS
80.0000 mg | ORAL_TABLET | Freq: Every day | ORAL | Status: DC
  Administered 2017-03-17 – 2017-03-18 (×2): 80 mg via ORAL
  Filled 2017-03-17 (×2): qty 1

## 2017-03-17 MED ORDER — ASPIRIN EC 81 MG PO TBEC
81.0000 mg | DELAYED_RELEASE_TABLET | Freq: Every day | ORAL | Status: DC
  Administered 2017-03-17 – 2017-03-19 (×3): 81 mg via ORAL
  Filled 2017-03-17 (×3): qty 1

## 2017-03-17 MED ORDER — NITROGLYCERIN 0.4 MG SL SUBL: 0.4000 mg | SUBLINGUAL_TABLET | SUBLINGUAL | Status: DC | PRN

## 2017-03-17 MED ORDER — ONDANSETRON HCL 4 MG/2ML IJ SOLN: 4.0000 mg | Freq: Four times a day (QID) | INTRAMUSCULAR | Status: DC | PRN

## 2017-03-17 MED ORDER — NITROGLYCERIN IN D5W 200-5 MCG/ML-% IV SOLN
5.0000 ug/min | INTRAVENOUS | Status: DC
  Administered 2017-03-17: 5 ug/min via INTRAVENOUS
  Filled 2017-03-17: qty 250

## 2017-03-17 MED ORDER — INSULIN GLARGINE 100 UNIT/ML ~~LOC~~ SOLN
40.0000 [IU] | Freq: Two times a day (BID) | SUBCUTANEOUS | Status: DC
  Administered 2017-03-17 – 2017-03-19 (×4): 40 [IU] via SUBCUTANEOUS
  Filled 2017-03-17 (×6): qty 0.4

## 2017-03-17 MED ORDER — TECHNETIUM TO 99M ALBUMIN AGGREGATED: 4.0000 | Freq: Once | INTRAVENOUS | Status: DC | PRN

## 2017-03-17 NOTE — Progress Notes (Signed)
ANTICOAGULATION CONSULT NOTE - Initial Consult  Pharmacy Consult for heparin Indication: chest pain/ACS  Allergies  Allergen Reactions  . Shellfish Allergy Anaphylaxis and Swelling  . Diazepam Other (See Comments)    "felt like out of body experience"  . Morphine And Related Itching    Patient Measurements: Height: 5\' 8"  (172.7 cm) Weight: 298 lb 8 oz (135.4 kg) IBW/kg (Calculated) : 63.9 Heparin Dosing Weight: 96kg  Assessment: 71 yof with significant cardiac history presented from her cardiologists office with worsening SOB. To start IV heparin. Baseline H/H is slightly low and platelets are WNL. She is not on anticoagulation PTA. Heparin level now slightly low at 0.26.  Goal of Therapy:  Heparin level 0.3-0.7 units/ml Monitor platelets by anticoagulation protocol: Yes   Plan:  Increase heparin gtt to 1,800 units/hr Monitor daily heparin level, CBC, s/s of bleed  Elenor Quinones, PharmD, [facility] Clinical Pharmacist Pager [phone] 03/17/2017 8:37 PM

## 2017-03-17 NOTE — Patient Instructions (Signed)
You are being admitted to [facility].

## 2017-03-17 NOTE — ED Triage Notes (Signed)
Pt arrived by [facility] from cardiology office. Pt having sob x 1 week but getting progressively worse. Sob increases with exertion and has increase in swelling to legs. Hx of chf.

## 2017-03-17 NOTE — ED Notes (Signed)
Taken to xray.

## 2017-03-17 NOTE — ED Notes (Signed)
Attempted report x1. 

## 2017-03-17 NOTE — Progress Notes (Signed)
ANTICOAGULATION CONSULT NOTE - Initial Consult  Pharmacy Consult for heparin Indication: chest pain/ACS  Allergies  Allergen Reactions  . Shellfish Allergy Anaphylaxis and Swelling  . Diazepam Other (See Comments)    "felt like out of body experience"  . Morphine And Related Itching    Patient Measurements:   Heparin Dosing Weight: 96kg  Vital Signs: Temp: 97.5 F (36.4 C) (09/05 1039) Temp Source: Oral (09/05 1039) BP: 135/54 (09/05 1039) Pulse Rate: 77 (09/05 1039)  Labs:  Recent Labs  03/17/17 1035  HGB 10.2*  HCT 32.1*  PLT 233    CrCl cannot be calculated (Patient's most recent lab result is older than the maximum 21 days allowed.).   Medical History: Past Medical History:  Diagnosis Date  . Aortic stenosis    Echo 8/18: mean 13, peak 28, LVOT/AV mean velocity 0.51  . Asthma    As a child   . Bronchitis   . CAD (coronary artery disease)    a. 09/2016: 50% Ost 1st Mrg stenosis, 50% 2nd Mrg stenosis, 20% Mid-Cx, 95% Prox LAD, 40% mid-LAD, and 10% dist-LAD stenosis. Staged PCI with DES to Prox-LAD.   [name] Chronic combined systolic and diastolic CHF (congestive heart failure) ([place]) 2011   echo 2/18: EF 55-60, normal wall motion, grade 2 diastolic dysfunction, trivial AI // echo 3/18: Septal and apical HK, EF 45-50, normal wall motion, trivial AI, mild LAE, PASP 38 // echo 8/18: EF 60-65, normal wall motion, grade 1 diastolic dysfunction, calcified aortic valve leaflets, mild aortic stenosis (mean 13, peak 28, LVOT/AV mean velocity 0.51), mild AI, moderate MAC, mild LAE, trivial TR   . Complication of anesthesia   . Diabetes mellitus Dx 1989  . Hepatitis C Dx 2013  . Hypertension Dx 1989  . Obesity   . Pancreatitis 2013  . Refusal of blood transfusions as patient is Jehovah's Witness   . Ulcer 2010    Medications:  Infusions:  . heparin    . nitroGLYCERIN      Assessment: 5 yof with significant cardiac history presented from her cardiologists office  with worsening SOB. To start IV heparin. Baseline H/H is slightly low and platelets are WNL. She is not on anticoagulation PTA.   Goal of Therapy:  Heparin level 0.3-0.7 units/ml Monitor platelets by anticoagulation protocol: Yes   Plan:  Heparin bolus 4000 units IV x 1 Heparin gtt 1600 units/hr - previously therapeutic on this rate Check a 6 hr heparin level Daily heparin level and CBC  Braeson Rupe, Rande Lawman 03/17/2017,12:28 PM

## 2017-03-17 NOTE — ED Notes (Signed)
Patient transported to NM scan

## 2017-03-17 NOTE — H&P (Signed)
Admission History and Physical:    Date:  03/17/2017   ID:  [name], DOB 08/14/1955, MRN [id number]  PCP:  Boykin Nearing, MD  Cardiologist:  Dr. Sherren Mocha    Referring MD: Boykin Nearing, MD   Chief Complaint  Patient presents with  . Congestive Heart Failure    Follow-up    History of Present Illness:    [name] is a 61 y.o. female with a hx of CAD, combined systolic and diastolic HF, CKD. She suffered a non-STEMI in 0/09 complicated by acute heart failure. She ultimately underwent DES to the LAD. Last seen by Dr. Burt Knack 02/17/17. She was volume overloaded and her furosemide dose was adjusted. She was also referred to nephrology. Follow-up echocardiogram 03/10/17 demonstrated normal LV function, mild diastolic dysfunction and mild aortic stenosis. Repeat echocardiogram last week demonstrated normal LV function, mild diastolic dysfunction and mild aortic stenosis.  [name] returns for follow up.  She is here alone.  She stopped several times coming into the exam room to catch her breath.  She notes worsening dyspnea on exertion since last seen.  She also notes chest pain off and on since last week.  She has had chest pain off and on since her myocardial infarction.  However, her pain is worse since last week.  This chest pain seems more like her pain with her myocardial infarction.  She describes L sided chest pressure with radiation to her jaw and assoc shortness of breath.  She notes pain with minimal exertion at home.  She also notes chest pain at rest.  She is short of breath with minimal exertion.  She notes 2 pillow orthopnea.  She denies paroxysmal nocturnal dyspnea, syncope.  She looks uncomfortable just talking to me in the exam room.    Prior CV studies:   The following studies were reviewed today:  Echocardiogram 03/10/17 EF 60-65, normal wall motion, grade 1 diastolic dysfunction, calcified aortic valve leaflets, mild aortic stenosis (mean 13, peak 28, LVOT/AV  mean velocity 0.51), mild AI, moderate MAC, mild LAE, trivial TR  Percutaneous Coronary Intervention 09/18/16 LAD proximal 95, mid 40, distal 50 PCI:  3 x 32 mm Synergy DES to LAD Post-Intervention Diagram    Cardiac Catheterization 09/16/16 Proximal 95, mid 40, distal 10 LCx mid 20, OM1 50, OM2 50  Echocardiogram 09/14/16 Septal and apical HK, EF 45-50, normal wall motion, trivial AI, mild LAE, PASP 38  Renal artery [place] 09/14/16 Summary: Findings suggest 1-59% right renal artery stenosis. Unable to adequately evaluate the left renal artery due to technical Limitations.  Echocardiogram 09/04/16 EF 55-60, normal wall motion, grade 2 diastolic dysfunction, trivial AI  Past Medical History:  Diagnosis Date  . Aortic stenosis    Echo 8/18: mean 13, peak 28, LVOT/AV mean velocity 0.51  . Asthma    As a child   . Bronchitis   . CAD (coronary artery disease)    a. 09/2016: 50% Ost 1st Mrg stenosis, 50% 2nd Mrg stenosis, 20% Mid-Cx, 95% Prox LAD, 40% mid-LAD, and 10% dist-LAD stenosis. Staged PCI with DES to Prox-LAD.   [name] Chronic combined systolic and diastolic CHF (congestive heart failure) ([place]) 2011   echo 2/18: EF 55-60, normal wall motion, grade 2 diastolic dysfunction, trivial AI // echo 3/18: Septal and apical HK, EF 45-50, normal wall motion, trivial AI, mild LAE, PASP 38 // echo 8/18: EF 60-65, normal wall motion, grade 1 diastolic dysfunction, calcified aortic valve leaflets, mild aortic stenosis (mean 13,  peak 28, LVOT/AV mean velocity 0.51), mild AI, moderate MAC, mild LAE, trivial TR   . Complication of anesthesia   . Diabetes mellitus Dx 1989  . Hepatitis C Dx 2013  . Hypertension Dx 1989  . Obesity   . Pancreatitis 2013  . Refusal of blood transfusions as patient is Jehovah's Witness   . Ulcer 2010    Past Surgical History:  Procedure Laterality Date  . CHOLECYSTECTOMY    . CORONARY STENT INTERVENTION N/A 09/18/2016   Procedure: Coronary Stent Intervention;  Surgeon:  Troy Sine, MD;  Location: [facility];  Service: Cardiovascular;  Laterality: N/A;  . EYE SURGERY    . KNEE ARTHROSCOPY    . LEFT HEART CATH N/A 09/18/2016   Procedure: Left Heart Cath;  Surgeon: Troy Sine, MD;  Location: [facility];  Service: Cardiovascular;  Laterality: N/A;  . LEFT HEART CATH AND CORONARY ANGIOGRAPHY N/A 09/16/2016   Procedure: Left Heart Cath and Coronary Angiography;  Surgeon: Burnell Blanks, MD;  Location: [facility];  Service: Cardiovascular;  Laterality: N/A;  . TUBAL LIGATION    . TUBAL LIGATION      Current Medications: Current Meds  Medication Sig  . acetaminophen-codeine (TYLENOL #3) 300-30 MG tablet Take 1 tablet by mouth every 8 (eight) hours as needed for moderate pain.  [name] albuterol (PROVENTIL) (2.5 MG/3ML) 0.083% nebulizer solution Take 3 mLs (2.5 mg total) by nebulization every 6 (six) hours as needed for wheezing or shortness of breath.  [name] albuterol (VENTOLIN HFA) 108 (90 Base) MCG/ACT inhaler Inhale 2 puffs into the lungs every 4 (four) hours as needed for wheezing or shortness of breath.  . allopurinol (ZYLOPRIM) 100 MG tablet Take 1 tablet (100 mg total) by mouth daily.  [name] amLODipine (NORVASC) 5 MG tablet Take 5 mg by mouth daily.  [name] aspirin EC 81 MG EC tablet Take 1 tablet (81 mg total) by mouth daily.  [name] atorvastatin (LIPITOR) 80 MG tablet Take 1 tablet (80 mg total) by mouth daily at 6 PM.  . Blood Glucose Monitoring Suppl (TRUE METRIX METER) DEVI 1 each by Does not apply route 3 (three) times daily before meals.  . carvedilol (COREG) 25 MG tablet TAKE 1 TABLET BY MOUTH 2 TIMES DAILY WITH A MEAL.  [name] colchicine 0.6 MG tablet Take 1 tablet (0.6 mg total) by mouth daily.  . diclofenac sodium (VOLTAREN) 1 % GEL Apply 4 g topically daily as needed (for pain).  . ferrous sulfate 325 (65 FE) MG tablet Take 1 tablet (325 mg total) by mouth 2 (two) times daily with a meal.  . fluticasone (FLONASE) 50 MCG/ACT nasal spray  Place 2 sprays into both nostrils daily as needed for allergies or rhinitis.  . Fluticasone-Salmeterol (ADVAIR DISKUS) 250-50 MCG/DOSE AEPB Inhale 1 puff into the lungs 2 (two) times daily.  . furosemide (LASIX) 40 MG tablet Take 2 pills (80 mg) in the morning and 40 mg in the afternoon  . gabapentin (NEURONTIN) 100 MG capsule Take 200 mg by mouth 3 (three) times daily.  [name] glucose blood (TRUE METRIX BLOOD GLUCOSE TEST) test strip Use 3 times before meals  . hydrALAZINE (APRESOLINE) 50 MG tablet Take 1 tablet (50 mg total) by mouth 3 (three) times daily.  . hydrOXYzine (ATARAX/VISTARIL) 50 MG tablet TAKE 1 TABLET (50 MG TOTAL) BY MOUTH 3 (THREE) TIMES DAILY AS NEEDED.  [name] insulin aspart (NOVOLOG) 100 UNIT/ML injection Inject 25 Units into the skin 3 (  three) times daily before meals.  . Insulin Glargine (LANTUS SOLOSTAR) 100 UNIT/ML Solostar Pen Inject 40 Units into the skin 2 (two) times daily.  . Insulin Pen Needle (B-D ULTRAFINE III SHORT PEN) 31G X 8 MM MISC 1 application by Does not apply route daily.  . Insulin Syringes, Disposable, U-100 0.5 ML MISC Inject insulin as directed  . isosorbide mononitrate (IMDUR) 30 MG 24 hr tablet Take 1 tablet (30 mg total) by mouth daily.  [name] ketotifen (ZADITOR) 0.025 % ophthalmic solution Place 1 drop into both eyes daily as needed (for dry eyes).  [name] lisinopril (PRINIVIL,ZESTRIL) 10 MG tablet TAKE 1 TABLET BY MOUTH DAILY.  [name] omeprazole (PRILOSEC) 20 MG capsule Take 1 capsule (20 mg total) by mouth daily.  . pregabalin (LYRICA) 100 MG capsule Take 1 capsule (100 mg total) by mouth 3 (three) times daily.  . ranitidine (ZANTAC) 150 MG tablet Take 1 tablet (150 mg total) by mouth 2 (two) times daily.  . sitaGLIPtin (JANUVIA) 25 MG tablet Take 1 tablet (25 mg total) by mouth daily. For PASS  . ticagrelor (BRILINTA) 90 MG TABS tablet Take 1 tablet (90 mg total) by mouth 2 (two) times daily.  . TRUEPLUS LANCETS 28G MISC 1 each by Does not apply route 3 (three) times  daily before meals.  . valACYclovir (VALTREX) 500 MG tablet Take 1 tablet (500 mg total) by mouth daily.     Allergies:   Shellfish allergy; Diazepam; and Morphine and related   Social History   Social History  . Marital status: Divorced    Spouse name: N/A  . Number of children: N/A  . Years of education: N/A   Social History Main Topics  . Smoking status: Former Smoker    Quit date: 10/25/1980  . Smokeless tobacco: Never Used     Comment: quit smoking in 1982  . Alcohol use Yes     Comment: 3 times in last year  . Drug use: No     Comment: 08/21/2016 "clean since 05/1998"  . Sexual activity: Not Asked     Comment: Not asked   Other Topics Concern  . None   Social History Narrative  . None     Family Hx: The patient's family history includes Colon cancer in her mother; Diabetes in her paternal grandmother; Heart attack in her maternal grandmother and other; Hypertension in her brother and sister. There is no history of Breast cancer.  ROS:   Please see the history of present illness.    Review of Systems  Constitution: Positive for chills, decreased appetite and malaise/fatigue.  Eyes: Positive for visual disturbance.  Cardiovascular: Positive for chest pain, dyspnea on exertion and leg swelling.  Respiratory: Positive for shortness of breath, snoring and wheezing.   Hematologic/Lymphatic: Bruises/bleeds easily.  Musculoskeletal: Positive for myalgias.  Gastrointestinal: Positive for constipation.  Neurological: Positive for headaches.  Psychiatric/Behavioral: Positive for depression.   All other systems reviewed and are negative.   EKGs/Labs/Other Test Reviewed:    EKG:  EKG is  ordered today.  The ekg ordered today demonstrates NSR, HR 78, normal axis, QTC 492 ms, nonspecific ST-T wave changes  Recent Labs: 09/13/2016: TSH 1.860 11/10/2016: NT-Pro BNP 202 11/17/2016: ALT 31 12/01/2016: B Natriuretic Peptide 124.6 12/13/2016: Magnesium 2.2 01/07/2017: Hemoglobin  10.9; Platelets 236 02/12/2017: BUN 60; Creatinine, Ser 2.11; Potassium 4.5; Sodium 142   Recent Lipid Panel Lab Results  Component Value Date/Time   CHOL 122 09/13/2016 12:45 PM   TRIG 168 (H)  09/13/2016 12:45 PM   HDL 36 (L) 09/13/2016 12:45 PM   CHOLHDL 3.4 09/13/2016 12:45 PM   LDLCALC 52 09/13/2016 12:45 PM    Physical Exam:    VS:  BP (!) 122/50   Pulse 78   Ht _0  (1.727 m)   Wt 297 lb (134.7 kg)   SpO2 98%   BMI 45.16 kg/m     Wt Readings from Last 3 Encounters:  03/17/17 297 lb (134.7 kg)  02/17/17 (!) 304 lb 6.4 oz (138.1 kg)  02/12/17 299 lb 3.2 oz (135.7 kg)     Physical Exam  Constitutional: She is oriented to person, place, and time. She appears well-developed and well-nourished. She appears distressed.  HENT:  Head: Normocephalic and atraumatic.  Eyes: No scleral icterus.  Neck: JVD (difficult exam; JVP ~ 6-7 cm) present.  Cardiovascular: Normal rate and regular rhythm.   No murmur heard. Pulmonary/Chest: Effort normal. She has no rales.  Abdominal: Soft. There is no tenderness.  Musculoskeletal: She exhibits edema (trace ankle edema bilat ).  Neurological: She is alert and oriented to person, place, and time.  Skin: Skin is warm and dry.  Psychiatric: She has a normal mood and affect.    ASSESSMENT:    1. Coronary artery disease involving native coronary artery of native heart with unstable angina pectoris ([place])   2. Acute on chronic combined systolic (congestive) and diastolic (congestive) heart failure ([place])   3. Nonrheumatic aortic valve stenosis   4. Essential hypertension   5. CKD (chronic kidney disease) stage 4, GFR 15-29 ml/min (HCC)    PLAN:    In order of problems listed above:  1. Coronary artery disease involving native coronary artery of native heart with unstable angina pectoris [facility]) She presents with symptoms of chest pain and worsening shortness of breath. Her symptoms are concerning for unstable angina. Her ECG is not acute.   She is visibly uncomfortable in the exam room.  I have recommended admission to the hospital. I discussed with Dr. Liam Rogers who agreed.    -  Admit to [facility] (going to ED as there are no beds; via EMS)  -  Check serial Troponins  -  CMET, CBC, BNP, CXR  -  Add IV NTG, IV Heparin  -  Continue ASA, Brilinta, statin, beta-blocker  -  Suspect she will need Cardiac Catheterization at some point this admit  -  Will try IV diuresis initially prior to proceeding with cath  -  Will also obtain VQ scan to r/o pulmonary embolism   2. Acute on chronic combined systolic (congestive) and diastolic (congestive) heart failure ([place]) She seems to be volume overloaded as well.  Her weight is down 7 lbs since last seen.  Her exam is difficult and her lungs are clear.  But, overall, her exam does suggest volume excess.    -  Continue beta-blocker, hydralazine, nitrates.    -  Will give Lasix 80 mg IV x 1  -  Reconsider Lasix dose/route in AM  -  BMET in AM  3. Nonrheumatic aortic valve stenosis Mild by recent echo.  4. Essential hypertension The patient's blood pressure is controlled on her current regimen.     5. CKD (chronic kidney disease) stage 4, GFR 15-29 ml/min (HCC) She is due to see Nephrology next week.  We may need to consult Nephrology during this admit.  As noted, she will need Cardiac Catheterization to evaluate her chest pain.  She is high risk  for contrast induced nephropathy.  Hold ACE inhibitor for now.    Dispo:  Return for [facility] Follow Up, w/ Dr. Burt Knack, or Richardson Dopp, PA-C.   Medication Adjustments/Labs and Tests Ordered: Current medicines are reviewed at length with the patient today.  Concerns regarding medicines are outlined above.  Tests Ordered: Orders Placed This Encounter  Procedures  . EKG 12-Lead   Medication Changes: No orders of the defined types were placed in this encounter.  Severity of Illness: The appropriate patient status for this patient is  INPATIENT. Inpatient status is judged to be reasonable and necessary in order to provide the required intensity of service to ensure the patient's safety. The patient's presenting symptoms, physical exam findings, and initial radiographic and laboratory data in the context of their chronic comorbidities is felt to place them at high risk for further clinical deterioration. Furthermore, it is not anticipated that the patient will be medically stable for discharge from the hospital within 2 midnights of admission. The following factors support the patient status of inpatient.   " The patient's presenting symptoms include chest pain, shortness of breath, acute distress. " The worrisome physical exam findings include +JVD, LE edema. " The initial radiographic and laboratory data are worrisome because of n/a. " The chronic co-morbidities include diabetes, CAD, CHF, obesity, CKD.   * I certify that at the point of admission it is my clinical judgment that the patient will require inpatient hospital care spanning beyond 2 midnights from the point of admission due to high intensity of service, high risk for further deterioration and high frequency of surveillance required.*   Attending Note:   The patient was seen and examined.  Agree with assessment and plan as noted above.  Changes made to the above note as needed.  Patient seen and independently examined with Richardson Dopp, PA .   We discussed all aspects of the encounter. I agree with the assessment and plan as stated above.  1.   Dyspnea with exertion:    Symptoms are concerning.   There is a pleuretic component to her symptoms .   Will need a VQ scan. With her hx of acute on chronic CHF and renal insufficiency, she will need IV lasix  Agree with admission for IV lasix and further evaluation .  Symptoms do not sound ischemic.  Would check troponin.   Further plans after studies return - per inpatient cardiology team    I have spent a total of  40 minutes with patient reviewing hospital  notes , telemetry, EKGs, labs and examining patient as well as establishing an assessment and plan that was discussed with the patient. > 50% of time was spent in direct patient care.    Thayer Headings, Brooke Bonito., MD, [facility] 03/17/2017, 5:25 PM [address]  [place] [phone] Pager [phone]       Signed, Richardson Dopp, Hershal Coria  03/17/2017 10:23 AM    [facility] [place], [place], [place]  [address] Phone: [phone]; Fax: [phone]

## 2017-03-17 NOTE — Progress Notes (Signed)
Cardiology Office Note:    Date:  03/17/2017   ID:  [name], DOB December 17, 1955, MRN [id number]  PCP:  Boykin Nearing, MD  Cardiologist:  Dr. Sherren Mocha    Referring MD: Boykin Nearing, MD   Chief Complaint  Patient presents with  . Congestive Heart Failure    Follow-up    History of Present Illness:    [name] is a 61 y.o. female with a hx of CAD, combined systolic and diastolic HF, CKD. She suffered a non-STEMI in 7/56 complicated by acute heart failure. She ultimately underwent DES to the LAD. Last seen by Dr. Burt Knack 02/17/17. She was volume overloaded and her furosemide dose was adjusted. She was also referred to nephrology. Follow-up echocardiogram 03/10/17 demonstrated normal LV function, mild diastolic dysfunction and mild aortic stenosis. Repeat echocardiogram last week demonstrated normal LV function, mild diastolic dysfunction and mild aortic stenosis.  Ms. Keshishyan returns for follow up.  She is here alone.  She stopped several times coming into the exam room to catch her breath.  She notes worsening dyspnea on exertion since last seen.  She also notes chest pain off and on since last week.  She has had chest pain off and on since her myocardial infarction.  However, her pain is worse since last week.  This chest pain seems more like her pain with her myocardial infarction.  She describes L sided chest pressure with radiation to her jaw and assoc shortness of breath.  She notes pain with minimal exertion at home.  She also notes chest pain at rest.  She is short of breath with minimal exertion.  She notes 2 pillow orthopnea.  She denies paroxysmal nocturnal dyspnea, syncope.  She looks uncomfortable just talking to me in the exam room.    Prior CV studies:   The following studies were reviewed today:  Echocardiogram 03/10/17 EF 60-65, normal wall motion, grade 1 diastolic dysfunction, calcified aortic valve leaflets, mild aortic stenosis (mean 13, peak 28, LVOT/AV mean  velocity 0.51), mild AI, moderate MAC, mild LAE, trivial TR  Percutaneous Coronary Intervention 09/18/16 LAD proximal 95, mid 40, distal 50 PCI:  3 x 32 mm Synergy DES to LAD Post-Intervention Diagram    Cardiac Catheterization 09/16/16 Proximal 95, mid 40, distal 10 LCx mid 20, OM1 50, OM2 50  Echocardiogram 09/14/16 Septal and apical HK, EF 45-50, normal wall motion, trivial AI, mild LAE, PASP 38  Renal artery [place] 09/14/16 Summary: Findings suggest 1-59% right renal artery stenosis. Unable to adequately evaluate the left renal artery due to technical Limitations.  Echocardiogram 09/04/16 EF 55-60, normal wall motion, grade 2 diastolic dysfunction, trivial AI  Past Medical History:  Diagnosis Date  . Aortic stenosis    Echo 8/18: mean 13, peak 28, LVOT/AV mean velocity 0.51  . Asthma    As a child   . Bronchitis   . CAD (coronary artery disease)    a. 09/2016: 50% Ost 1st Mrg stenosis, 50% 2nd Mrg stenosis, 20% Mid-Cx, 95% Prox LAD, 40% mid-LAD, and 10% dist-LAD stenosis. Staged PCI with DES to Prox-LAD.   [name] Chronic combined systolic and diastolic CHF (congestive heart failure) ([place]) 2011   echo 2/18: EF 55-60, normal wall motion, grade 2 diastolic dysfunction, trivial AI // echo 3/18: Septal and apical HK, EF 45-50, normal wall motion, trivial AI, mild LAE, PASP 38 // echo 8/18: EF 60-65, normal wall motion, grade 1 diastolic dysfunction, calcified aortic valve leaflets, mild aortic stenosis (mean 13, peak  28, LVOT/AV mean velocity 0.51), mild AI, moderate MAC, mild LAE, trivial TR   . Complication of anesthesia   . Diabetes mellitus Dx 1989  . Hepatitis C Dx 2013  . Hypertension Dx 1989  . Obesity   . Pancreatitis 2013  . Refusal of blood transfusions as patient is Jehovah's Witness   . Ulcer 2010    Past Surgical History:  Procedure Laterality Date  . CHOLECYSTECTOMY    . CORONARY STENT INTERVENTION N/A 09/18/2016   Procedure: Coronary Stent Intervention;  Surgeon: Troy Sine, MD;  Location: [facility];  Service: Cardiovascular;  Laterality: N/A;  . EYE SURGERY    . KNEE ARTHROSCOPY    . LEFT HEART CATH N/A 09/18/2016   Procedure: Left Heart Cath;  Surgeon: Troy Sine, MD;  Location: [facility];  Service: Cardiovascular;  Laterality: N/A;  . LEFT HEART CATH AND CORONARY ANGIOGRAPHY N/A 09/16/2016   Procedure: Left Heart Cath and Coronary Angiography;  Surgeon: Burnell Blanks, MD;  Location: [facility];  Service: Cardiovascular;  Laterality: N/A;  . TUBAL LIGATION    . TUBAL LIGATION      Current Medications: Current Meds  Medication Sig  . acetaminophen-codeine (TYLENOL #3) 300-30 MG tablet Take 1 tablet by mouth every 8 (eight) hours as needed for moderate pain.  [name] albuterol (PROVENTIL) (2.5 MG/3ML) 0.083% nebulizer solution Take 3 mLs (2.5 mg total) by nebulization every 6 (six) hours as needed for wheezing or shortness of breath.  [name] albuterol (VENTOLIN HFA) 108 (90 Base) MCG/ACT inhaler Inhale 2 puffs into the lungs every 4 (four) hours as needed for wheezing or shortness of breath.  . allopurinol (ZYLOPRIM) 100 MG tablet Take 1 tablet (100 mg total) by mouth daily.  [name] amLODipine (NORVASC) 5 MG tablet Take 5 mg by mouth daily.  [name] aspirin EC 81 MG EC tablet Take 1 tablet (81 mg total) by mouth daily.  [name] atorvastatin (LIPITOR) 80 MG tablet Take 1 tablet (80 mg total) by mouth daily at 6 PM.  . carvedilol (COREG) 25 MG tablet TAKE 1 TABLET BY MOUTH 2 TIMES DAILY WITH A MEAL.  [name] colchicine 0.6 MG tablet Take 1 tablet (0.6 mg total) by mouth daily.  . diclofenac sodium (VOLTAREN) 1 % GEL Apply 4 g topically daily as needed (for pain).  . ferrous sulfate 325 (65 FE) MG tablet Take 1 tablet (325 mg total) by mouth 2 (two) times daily with a meal.  . fluticasone (FLONASE) 50 MCG/ACT nasal spray Place 2 sprays into both nostrils daily as needed for allergies or rhinitis.  . Fluticasone-Salmeterol (ADVAIR DISKUS) 250-50 MCG/DOSE  AEPB Inhale 1 puff into the lungs 2 (two) times daily. (Patient taking differently: Inhale 1 puff into the lungs 2 (two) times daily as needed (shortness of breath). )  . furosemide (LASIX) 40 MG tablet Take 2 pills (80 mg) in the morning and 40 mg in the afternoon (Patient taking differently: Take 40-80 mg by mouth See admin instructions. Take 2 pills (80 mg) in the morning and 40 mg in the afternoon)  . gabapentin (NEURONTIN) 100 MG capsule Take 200 mg by mouth 3 (three) times daily.  . hydrALAZINE (APRESOLINE) 50 MG tablet Take 1 tablet (50 mg total) by mouth 3 (three) times daily.  . hydrOXYzine (ATARAX/VISTARIL) 50 MG tablet TAKE 1 TABLET (50 MG TOTAL) BY MOUTH 3 (THREE) TIMES DAILY AS NEEDED. (Patient taking differently: Take 50 mg by mouth 3 (three) times  daily as needed for itching. )  . insulin aspart (NOVOLOG) 100 UNIT/ML injection Inject 25 Units into the skin 3 (three) times daily before meals.  . Insulin Glargine (LANTUS SOLOSTAR) 100 UNIT/ML Solostar Pen Inject 40 Units into the skin 2 (two) times daily.  . isosorbide mononitrate (IMDUR) 30 MG 24 hr tablet Take 1 tablet (30 mg total) by mouth daily.  [name] ketotifen (ZADITOR) 0.025 % ophthalmic solution Place 1 drop into both eyes daily as needed (for dry eyes).  [name] lisinopril (PRINIVIL,ZESTRIL) 10 MG tablet TAKE 1 TABLET BY MOUTH DAILY. (Patient taking differently: TAKE 94m BY MOUTH once DAILY.)  . omeprazole (PRILOSEC) 20 MG capsule Take 1 capsule (20 mg total) by mouth daily.  . pregabalin (LYRICA) 100 MG capsule Take 1 capsule (100 mg total) by mouth 3 (three) times daily.  . ranitidine (ZANTAC) 150 MG tablet Take 1 tablet (150 mg total) by mouth 2 (two) times daily.  . sitaGLIPtin (JANUVIA) 25 MG tablet Take 1 tablet (25 mg total) by mouth daily. For PASS  . ticagrelor (BRILINTA) 90 MG TABS tablet Take 1 tablet (90 mg total) by mouth 2 (two) times daily.  . valACYclovir (VALTREX) 500 MG tablet Take 1 tablet (500 mg total) by mouth  daily.  . [DISCONTINUED] Blood Glucose Monitoring Suppl (TRUE METRIX METER) DEVI 1 each by Does not apply route 3 (three) times daily before meals. (Patient not taking: Reported on 03/17/2017)  . [DISCONTINUED] glucose blood (TRUE METRIX BLOOD GLUCOSE TEST) test strip Use 3 times before meals (Patient not taking: Reported on 03/17/2017)  . [DISCONTINUED] Insulin Pen Needle (B-D ULTRAFINE III SHORT PEN) 31G X 8 MM MISC 1 application by Does not apply route daily. (Patient not taking: Reported on 03/17/2017)  . [DISCONTINUED] Insulin Syringes, Disposable, U-100 0.5 ML MISC Inject insulin as directed (Patient not taking: Reported on 03/17/2017)  . [DISCONTINUED] TRUEPLUS LANCETS 28G MISC 1 each by Does not apply route 3 (three) times daily before meals. (Patient not taking: Reported on 03/17/2017)     Allergies:   Shellfish allergy; Diazepam; and Morphine and related   Social History   Social History  . Marital status: Divorced    Spouse name: N/A  . Number of children: N/A  . Years of education: N/A   Social History Main Topics  . Smoking status: Former Smoker    Quit date: 10/25/1980  . Smokeless tobacco: Never Used     Comment: quit smoking in 1982  . Alcohol use Yes     Comment: 3 times in last year  . Drug use: No     Comment: 08/21/2016 "clean since 05/1998"  . Sexual activity: Not Asked     Comment: Not asked   Other Topics Concern  . None   Social History Narrative  . None     Family Hx: The patient's family history includes Colon cancer in her mother; Diabetes in her paternal grandmother; Heart attack in her maternal grandmother and other; Hypertension in her brother and sister. There is no history of Breast cancer.  ROS:   Please see the history of present illness.    Review of Systems  Constitution: Positive for chills, decreased appetite and malaise/fatigue.  Eyes: Positive for visual disturbance.  Cardiovascular: Positive for chest pain, dyspnea on exertion and leg swelling.   Respiratory: Positive for shortness of breath, snoring and wheezing.   Hematologic/Lymphatic: Bruises/bleeds easily.  Musculoskeletal: Positive for myalgias.  Gastrointestinal: Positive for constipation.  Neurological: Positive for headaches.  Psychiatric/Behavioral:  Positive for depression.   All other systems reviewed and are negative.   EKGs/Labs/Other Test Reviewed:    EKG:  EKG is  ordered today.  The ekg ordered today demonstrates NSR, HR 78, normal axis, QTC 492 ms, nonspecific ST-T wave changes  Recent Labs: 09/13/2016: TSH 1.860 11/10/2016: NT-Pro BNP 202 11/17/2016: ALT 31 12/13/2016: Magnesium 2.2 03/17/2017: B Natriuretic Peptide 18.8; BUN 77; Creatinine, Ser 2.81; Hemoglobin 10.2; Platelets 233; Potassium 4.2; Sodium 137   Recent Lipid Panel Lab Results  Component Value Date/Time   CHOL 122 09/13/2016 12:45 PM   TRIG 168 (H) 09/13/2016 12:45 PM   HDL 36 (L) 09/13/2016 12:45 PM   CHOLHDL 3.4 09/13/2016 12:45 PM   LDLCALC 52 09/13/2016 12:45 PM    Physical Exam:    VS:  BP (!) 122/50   Pulse 78   Ht _0  (1.727 m)   Wt 297 lb (134.7 kg)   SpO2 98%   BMI 45.16 kg/m     Wt Readings from Last 3 Encounters:  03/17/17 297 lb (134.7 kg)  02/17/17 (!) 304 lb 6.4 oz (138.1 kg)  02/12/17 299 lb 3.2 oz (135.7 kg)     Physical Exam  Constitutional: She is oriented to person, place, and time. She appears well-developed and well-nourished. She appears distressed.  HENT:  Head: Normocephalic and atraumatic.  Eyes: No scleral icterus.  Neck: JVD (difficult exam; JVP ~ 6-7 cm) present.  Cardiovascular: Normal rate and regular rhythm.   No murmur heard. Pulmonary/Chest: Effort normal. She has no rales.  Abdominal: Soft. There is no tenderness.  Musculoskeletal: She exhibits edema (trace ankle edema bilat ).  Neurological: She is alert and oriented to person, place, and time.  Skin: Skin is warm and dry.  Psychiatric: She has a normal mood and affect.    ASSESSMENT:      1. Coronary artery disease involving native coronary artery of native heart with unstable angina pectoris ([place])   2. Acute on chronic combined systolic (congestive) and diastolic (congestive) heart failure ([place])   3. Nonrheumatic aortic valve stenosis   4. Essential hypertension   5. CKD (chronic kidney disease) stage 4, GFR 15-29 ml/min (HCC)    PLAN:    In order of problems listed above:  1. Coronary artery disease involving native coronary artery of native heart with unstable angina pectoris [facility]) She presents with symptoms of chest pain and worsening shortness of breath. Her symptoms are concerning for unstable angina. Her ECG is not acute.  She is visibly uncomfortable in the exam room.  I have recommended admission to the hospital. I discussed with Dr. Liam Rogers who agreed.    -  Admit to [facility] (going to ED as there are no beds; via EMS)  -  Check serial Troponins  -  CMET, CBC, BNP, CXR  -  Add IV NTG, IV Heparin   -  Continue ASA, Brilinta, statin, beta-blocker  -  Suspect she will need Cardiac Catheterization at some point this admit  -  Will try IV diuresis initially prior to proceeding with cath  -  Will also obtain VQ scan to r/o pulmonary embolism   2. Acute on chronic combined systolic (congestive) and diastolic (congestive) heart failure ([place]) She seems to be volume overloaded as well.  Her weight is down 7 lbs since last seen.  Her exam is difficult and her lungs are clear.  But, overall, her exam does suggest volume excess.    -  Continue beta-blocker,  hydralazine, nitrates.    -  Will give Lasix 80 mg IV x 1  -  Reconsider Lasix dose/route in AM  -  BMET in AM  3. Nonrheumatic aortic valve stenosis Mild by recent echo.  4. Essential hypertension The patient's blood pressure is controlled on her current regimen.     5. CKD (chronic kidney disease) stage 4, GFR 15-29 ml/min (HCC) She is due to see Nephrology next week.  We may need to consult Nephrology during  this admit.  As noted, she will need Cardiac Catheterization to evaluate her chest pain.  She is high risk for contrast induced nephropathy.  Hold ACE inhibitor.    Dispo:  Return for [facility] Follow Up, w/ Dr. Burt Knack, or Richardson Dopp, PA-C.   Medication Adjustments/Labs and Tests Ordered: Current medicines are reviewed at length with the patient today.  Concerns regarding medicines are outlined above.  Tests Ordered: Orders Placed This Encounter  Procedures  . EKG 12-Lead   Medication Changes: No orders of the defined types were placed in this encounter.   Signed, Richardson Dopp, PA-C  03/17/2017 5:22 PM    [facility] [place], [place], [place]  [address] Phone: [phone]; Fax: [phone]

## 2017-03-18 LAB — CBC
HCT: 30.8 % — ABNORMAL LOW (ref 36.0–46.0)
Hemoglobin: 10 g/dL — ABNORMAL LOW (ref 12.0–15.0)
MCH: 27.3 pg (ref 26.0–34.0)
MCHC: 32.5 g/dL (ref 30.0–36.0)
MCV: 84.2 fL (ref 78.0–100.0)
Platelets: 232 10*3/uL (ref 150–400)
RBC: 3.66 MIL/uL — ABNORMAL LOW (ref 3.87–5.11)
RDW: 16.2 % — ABNORMAL HIGH (ref 11.5–15.5)
WBC: 11.2 10*3/uL — ABNORMAL HIGH (ref 4.0–10.5)

## 2017-03-18 LAB — BASIC METABOLIC PANEL
Anion gap: 14 (ref 5–15)
BUN: 78 mg/dL — ABNORMAL HIGH (ref 6–20)
CO2: 16 mmol/L — ABNORMAL LOW (ref 22–32)
Calcium: 8.6 mg/dL — ABNORMAL LOW (ref 8.9–10.3)
Chloride: 104 mmol/L (ref 101–111)
Creatinine, Ser: 2.97 mg/dL — ABNORMAL HIGH (ref 0.44–1.00)
GFR calc Af Amer: 19 mL/min — ABNORMAL LOW (ref >60)
GFR calc non Af Amer: 16 mL/min — ABNORMAL LOW (ref >60)
Glucose, Bld: 289 mg/dL — ABNORMAL HIGH (ref 65–99)
Potassium: 4.3 mmol/L (ref 3.5–5.1)
Sodium: 134 mmol/L — ABNORMAL LOW (ref 135–145)

## 2017-03-18 LAB — GLUCOSE, CAPILLARY
Glucose-Capillary: 271 mg/dL — ABNORMAL HIGH (ref 65–99)
Glucose-Capillary: 136 mg/dL — ABNORMAL HIGH (ref 65–99)
Glucose-Capillary: 185 mg/dL — ABNORMAL HIGH (ref 65–99)
Glucose-Capillary: 166 mg/dL — ABNORMAL HIGH (ref 65–99)

## 2017-03-18 LAB — FERRITIN: Ferritin: 168 ng/mL (ref 11–307)

## 2017-03-18 LAB — IRON AND TIBC
Iron: 93 ug/dL (ref 28–170)
Saturation Ratios: 26 % (ref 10.4–31.8)
TIBC: 351 ug/dL (ref 250–450)
UIBC: 258 ug/dL

## 2017-03-18 LAB — HEPARIN LEVEL (UNFRACTIONATED)
Heparin Unfractionated: 0.24 IU/mL — ABNORMAL LOW (ref 0.30–0.70)
Heparin Unfractionated: 0.35 IU/mL (ref 0.30–0.70)

## 2017-03-18 LAB — TROPONIN I: Troponin I: <0.03 ng/mL (ref <0.03)

## 2017-03-18 MED ORDER — ISOSORBIDE MONONITRATE ER 60 MG PO TB24
60.0000 mg | ORAL_TABLET | Freq: Every day | ORAL | Status: DC
  Administered 2017-03-18 – 2017-03-19 (×2): 60 mg via ORAL
  Filled 2017-03-18 (×2): qty 1

## 2017-03-18 MED ORDER — ENSURE ENLIVE PO LIQD
237.0000 mL | Freq: Two times a day (BID) | ORAL | Status: DC
  Administered 2017-03-19 (×2): 237 mL via ORAL

## 2017-03-18 NOTE — Progress Notes (Signed)
Inpatient Diabetes Program Recommendations  AACE/ADA: New Consensus Statement on Inpatient Glycemic Control (2015)  Target Ranges:  Prepandial:   less than 140 mg/dL      Peak postprandial:   less than 180 mg/dL (1-2 hours)      Critically ill patients:  140 - 180 mg/dL   Lab Results  Component Value Date   GLUCAP 136 (H) 03/18/2017   HGBA1C 9.8 02/12/2017    Review of Glycemic Control  Diabetes history: DM2 Outpatient Diabetes medications: Lantus 40 units bid + Novolog 25 units tid MC + Januvia 25 Current orders for Inpatient glycemic control: Lantus 40 units bid + Novolog 25 units tid MC + Tradjenta qd   Inpatient Diabetes Program Recommendations:  While in the hospital, may need to decrease Novolog to 10-15 units tid meal coverage. Will follow.  Thank you, Nani Gasser. Chelcee Korpi, RN, MSN, CDE  Diabetes Coordinator Inpatient Glycemic Control Team Team Pager [phone] (8am-5pm) 03/18/2017 2:27 PM

## 2017-03-18 NOTE — Progress Notes (Addendum)
ANTICOAGULATION CONSULT NOTE - Initial Consult  Pharmacy Consult for heparin Indication: chest pain/ACS  Allergies  Allergen Reactions  . Shellfish Allergy Anaphylaxis and Swelling  . Diazepam Other (See Comments)    "felt like out of body experience"  . Morphine And Related Itching    Patient Measurements: Height: 5\' 8"  (172.7 cm) Weight: 299 lb 1.6 oz (135.7 kg) IBW/kg (Calculated) : 63.9 Heparin Dosing Weight: 96kg  Assessment: 4 yof with significant cardiac history presented from her cardiologists office with worsening SOB. Started IV heparin due to concern for unstable angina with possible cath needed this admission. Baseline H/H is slightly low and platelets are WNL - both stable. She is not on anticoagulation PTA.   Heparin level was 0.35 this morning, within goal range.  Goal of Therapy:  Heparin level 0.3-0.7 units/ml Monitor platelets by anticoagulation protocol: Yes   Plan:  Continue heparin gtt at 1,800 units/hr Monitor daily heparin level, CBC, s/s of bleed Will follow along for plan from cardiology  Doylene Canard, PharmD Clinical Pharmacist  Phone: [phone] 03/18/2017 8:41 AM

## 2017-03-18 NOTE — Consult Note (Addendum)
Reason for Consult: AKI/CKD Referring Physician:  Percival Spanish, MD  [name] is an 61 y.o. female.  HPI:  [name] is a 61yo AAF with PMH significant for long-standing, poorly controlled DM, HTN, morbid obesity, OSA not on CPAP, combined systolic and diastolic CHF, CAD s/p NSTEMI and DES placed in LAD 3/18, and CKD stage 3-4 with multiple episodes of AKI/CKD related to episodes of decompensated CHF.  Her trend in Scr is seen below.  She has been referred to our practice and was scheduled to see Dr. Posey [name] in October, however she was admitted 03/17/17 with worsening dyspnea on exertion, chest pain (concerning for [place]), and decompensated CHF.  She was started on IV lasix and we were consulted due to AKI/CKD following diuresis, although she has gained 0.4 kg since admission yesterday and has not had any urine output documented today.  Of note, she had been taking lisinopril as an outpatient (restarted in July by her PCP due to worsening HTN).  We were consulted to further evaluate and manage her AKI/CKD in the understanding that she will require a cardiac cath during this admission.  She denies any N/V, anorexia, dysuria, pyuria, hematuria, urgency, frequency, but does have some urinary retention/hesitancy.  Denies any NSAIDs/Cox-II I's for the last year.  Trend in Creatinine:  Creatinine, Ser  Date/Time Value Ref Range Status  03/18/2017 12:12 AM 2.97 (H) 0.44 - 1.00 mg/dL Final  03/17/2017 07:04 PM 2.87 (H) 0.44 - 1.00 mg/dL Final  03/17/2017 10:35 AM 2.81 (H) 0.44 - 1.00 mg/dL Final  02/12/2017 11:07 AM 2.11 (H) 0.57 - 1.00 mg/dL Final  02/05/2017 10:26 AM 1.77 (H) 0.57 - 1.00 mg/dL Final  01/07/2017 09:46 AM 2.02 (H) 0.57 - 1.00 mg/dL Final  12/14/2016 02:44 AM 2.91 (H) 0.44 - 1.00 mg/dL Final  12/13/2016 06:04 AM 3.09 (H) 0.44 - 1.00 mg/dL Final  12/13/2016 01:33 AM 3.37 (H) 0.44 - 1.00 mg/dL Final  12/12/2016 07:32 PM 3.39 (H) 0.44 - 1.00 mg/dL Final  12/01/2016 12:09 AM 2.38 (H) 0.44 - 1.00  mg/dL Final  11/17/2016 04:32 PM 1.77 (H) 0.57 - 1.00 mg/dL Final  11/10/2016 02:18 PM 2.19 (H) 0.57 - 1.00 mg/dL Final  10/26/2016 12:14 PM 2.24 (H) 0.57 - 1.00 mg/dL Final  10/07/2016 11:06 AM 2.02 (H) 0.57 - 1.00 mg/dL Final  09/19/2016 04:15 AM 1.87 (H) 0.44 - 1.00 mg/dL Final  09/18/2016 04:36 AM 1.54 (H) 0.44 - 1.00 mg/dL Final  09/17/2016 08:22 AM 1.65 (H) 0.44 - 1.00 mg/dL Final  09/16/2016 05:58 AM 2.28 (H) 0.44 - 1.00 mg/dL Final  09/15/2016 11:36 AM 2.27 (H) 0.44 - 1.00 mg/dL Final  09/14/2016 02:49 AM 2.37 (H) 0.44 - 1.00 mg/dL Final  09/13/2016 04:18 AM 1.73 (H) 0.44 - 1.00 mg/dL Final  09/05/2016 04:39 AM 1.61 (H) 0.44 - 1.00 mg/dL Final  09/04/2016 06:22 AM 1.55 (H) 0.44 - 1.00 mg/dL Final  09/03/2016 07:24 PM 1.82 (H) 0.44 - 1.00 mg/dL Final  08/22/2016 04:29 AM 2.43 (H) 0.44 - 1.00 mg/dL Final  08/21/2016 03:40 PM 2.69 (H) 0.44 - 1.00 mg/dL Final  08/21/2016 01:45 PM 2.61 (H) 0.44 - 1.00 mg/dL Final  09/14/2015 12:54 PM 1.80 (H) 0.44 - 1.00 mg/dL Final  09/14/2015 12:47 PM 1.89 (H) 0.44 - 1.00 mg/dL Final  03/19/2015 06:20 AM 1.62 (H) 0.44 - 1.00 mg/dL Final  03/18/2015 10:59 AM 1.48 (H) 0.44 - 1.00 mg/dL Final  03/18/2015 05:14 AM 1.54 (H) 0.44 - 1.00 mg/dL Final  02/18/2015 03:41 AM  1.52 (H) 0.44 - 1.00 mg/dL Final  02/17/2015 03:12 AM 1.49 (H) 0.44 - 1.00 mg/dL Final  02/16/2015 02:22 AM 1.66 (H) 0.44 - 1.00 mg/dL Final  02/15/2015 09:46 AM 1.47 (H) 0.44 - 1.00 mg/dL Final  10/21/2014 06:09 PM 1.46 (H) 0.50 - 1.10 mg/dL Final  10/11/2014 10:17 AM 1.26 (H) 0.40 - 1.20 mg/dL Final  10/04/2014 11:41 AM 1.40 (H) 0.50 - 1.10 mg/dL Final  10/04/2014 03:53 AM 1.47 (H) 0.50 - 1.10 mg/dL Final  10/02/2014 04:33 AM 1.05 0.50 - 1.10 mg/dL Final  04/01/2012 04:08 AM 0.75 0.50 - 1.10 mg/dL Final  03/30/2012 03:45 AM 0.78 0.50 - 1.10 mg/dL Final  03/26/2012 04:40 AM 0.91 0.50 - 1.10 mg/dL Final  03/24/2012 09:55 PM 0.99 0.50 - 1.10 mg/dL Final  02/07/2012 06:00 PM 0.91 0.50  - 1.10 mg/dL Final  05/29/2010 09:45 PM 0.95 0.4 - 1.2 mg/dL Final  04/11/2010 01:03 AM 0.90 0.4 - 1.2 mg/dL Final  02/06/2010 10:17 PM 0.9 0.4 - 1.2 mg/dL Final  07/20/2009 12:56 PM 0.74 0.4 - 1.2 mg/dL Final  03/20/2009 04:51 PM 0.75 0.4 - 1.2 mg/dL Final    PMH:   Past Medical History:  Diagnosis Date  . Aortic stenosis    Echo 8/18: mean 13, peak 28, LVOT/AV mean velocity 0.51  . Asthma    As a child   . Bronchitis   . CAD (coronary artery disease)    a. 09/2016: 50% Ost 1st Mrg stenosis, 50% 2nd Mrg stenosis, 20% Mid-Cx, 95% Prox LAD, 40% mid-LAD, and 10% dist-LAD stenosis. Staged PCI with DES to Prox-LAD.   [name] Chronic combined systolic and diastolic CHF (congestive heart failure) ([facility]) 2011   echo 2/18: EF 55-60, normal wall motion, grade 2 diastolic dysfunction, trivial AI // echo 3/18: Septal and apical HK, EF 45-50, normal wall motion, trivial AI, mild LAE, PASP 38 // echo 8/18: EF 60-65, normal wall motion, grade 1 diastolic dysfunction, calcified aortic valve leaflets, mild aortic stenosis (mean 13, peak 28, LVOT/AV mean velocity 0.51), mild AI, moderate MAC, mild LAE, trivial TR   . Complication of anesthesia   . Diabetes mellitus Dx 1989  . Hepatitis C Dx 2013  . Hypertension Dx 1989  . Obesity   . Pancreatitis 2013  . Refusal of blood transfusions as patient is Jehovah's Witness   . Ulcer 2010    PSH:   Past Surgical History:  Procedure Laterality Date  . CHOLECYSTECTOMY    . CORONARY STENT INTERVENTION N/A 09/18/2016   Procedure: Coronary Stent Intervention;  Surgeon: Troy Sine, MD;  Location: [facility];  Service: Cardiovascular;  Laterality: N/A;  . EYE SURGERY    . KNEE ARTHROSCOPY    . LEFT HEART CATH N/A 09/18/2016   Procedure: Left Heart Cath;  Surgeon: Troy Sine, MD;  Location: [facility];  Service: Cardiovascular;  Laterality: N/A;  . LEFT HEART CATH AND CORONARY ANGIOGRAPHY N/A 09/16/2016   Procedure: Left Heart Cath and Coronary  Angiography;  Surgeon: Burnell Blanks, MD;  Location: [facility];  Service: Cardiovascular;  Laterality: N/A;  . TUBAL LIGATION    . TUBAL LIGATION      Allergies:  Allergies  Allergen Reactions  . Shellfish Allergy Anaphylaxis and Swelling  . Diazepam Other (See Comments)    "felt like out of body experience"  . Morphine And Related Itching    Medications:   Prior to Admission medications   Medication Sig Start Date  End Date Taking? Authorizing Provider  acetaminophen-codeine (TYLENOL #3) 300-30 MG tablet Take 1 tablet by mouth every 8 (eight) hours as needed for moderate pain. 01/07/17  Yes Funches, Adriana Mccallum, MD  albuterol (VENTOLIN HFA) 108 (90 Base) MCG/ACT inhaler Inhale 2 puffs into the lungs every 4 (four) hours as needed for wheezing or shortness of breath. 08/03/16  Yes Funches, Josalyn, MD  allopurinol (ZYLOPRIM) 100 MG tablet Take 1 tablet (100 mg total) by mouth daily. 02/12/17  Yes Funches, Josalyn, MD  amLODipine (NORVASC) 5 MG tablet Take 5 mg by mouth daily. 01/15/17  Yes [provider]  aspirin EC 81 MG EC tablet Take 1 tablet (81 mg total) by mouth daily. 09/19/16  Yes Strader, [place] M, PA-C  atorvastatin (LIPITOR) 80 MG tablet Take 1 tablet (80 mg total) by mouth daily at 6 PM. 02/12/17  Yes Funches, Josalyn, MD  carvedilol (COREG) 25 MG tablet TAKE 1 TABLET BY MOUTH 2 TIMES DAILY WITH A MEAL. 02/12/17  Yes Funches, Josalyn, MD  colchicine 0.6 MG tablet Take 1 tablet (0.6 mg total) by mouth daily. 12/17/16  Yes Argentina Donovan, PA-C  ferrous sulfate 325 (65 FE) MG tablet Take 1 tablet (325 mg total) by mouth 2 (two) times daily with a meal. 12/17/16  Yes McClung, Angela M, PA-C  Fluticasone-Salmeterol (ADVAIR DISKUS) 250-50 MCG/DOSE AEPB Inhale 1 puff into the lungs 2 (two) times daily. Patient taking differently: Inhale 1 puff into the lungs 2 (two) times daily as needed (shortness of breath).  12/17/16  Yes McClung, Angela M, PA-C  furosemide (LASIX) 40  MG tablet Take 2 pills (80 mg) in the morning and 40 mg in the afternoon Patient taking differently: Take 40-80 mg by mouth See admin instructions. Take 2 pills (80 mg) in the morning and 40 mg in the afternoon 02/17/17  Yes Sherren Mocha, MD  gabapentin (NEURONTIN) 100 MG capsule Take 200 mg by mouth 3 (three) times daily. 01/15/17  Yes [provider]  hydrALAZINE (APRESOLINE) 50 MG tablet Take 1 tablet (50 mg total) by mouth 3 (three) times daily. 02/12/17  Yes Funches, Josalyn, MD  hydrOXYzine (ATARAX/VISTARIL) 50 MG tablet TAKE 1 TABLET (50 MG TOTAL) BY MOUTH 3 (THREE) TIMES DAILY AS NEEDED. Patient taking differently: Take 50 mg by mouth 3 (three) times daily as needed for itching.  03/02/17  Yes Jegede, Olugbemiga E, MD  insulin aspart (NOVOLOG) 100 UNIT/ML injection Inject 25 Units into the skin 3 (three) times daily before meals. 01/22/17  Yes Funches, Josalyn, MD  Insulin Glargine (LANTUS SOLOSTAR) 100 UNIT/ML Solostar Pen Inject 40 Units into the skin 2 (two) times daily. 01/22/17  Yes Funches, Josalyn, MD  isosorbide mononitrate (IMDUR) 30 MG 24 hr tablet Take 1 tablet (30 mg total) by mouth daily. 12/17/16  Yes Freeman Caldron M, PA-C  ketotifen (ZADITOR) 0.025 % ophthalmic solution Place 1 drop into both eyes daily as needed (for dry eyes). 07/27/16  Yes Funches, Josalyn, MD  lisinopril (PRINIVIL,ZESTRIL) 10 MG tablet TAKE 1 TABLET BY MOUTH DAILY. Patient taking differently: TAKE 73m BY MOUTH once DAILY. 03/16/17  Yes JTresa Garter MD  omeprazole (PRILOSEC) 20 MG capsule Take 1 capsule (20 mg total) by mouth daily. 02/12/17  Yes Funches, Josalyn, MD  pregabalin (LYRICA) 100 MG capsule Take 1 capsule (100 mg total) by mouth 3 (three) times daily. 03/12/17  Yes JTresa Garter MD  ranitidine (ZANTAC) 150 MG tablet Take 1 tablet (150 mg total) by mouth 2 (two) times  daily. 12/17/16  Yes McClung, Dionne Bucy, PA-C  sitaGLIPtin (JANUVIA) 25 MG tablet Take 1 tablet (25 mg total) by mouth  daily. For PASS 12/17/16  Yes Argentina Donovan, PA-C  ticagrelor (BRILINTA) 90 MG TABS tablet Take 1 tablet (90 mg total) by mouth 2 (two) times daily. 12/17/16  Yes McClung, Dionne Bucy, PA-C  valACYclovir (VALTREX) 500 MG tablet Take 1 tablet (500 mg total) by mouth daily. 02/12/17  Yes Funches, Josalyn, MD  albuterol (PROVENTIL) (2.5 MG/3ML) 0.083% nebulizer solution Take 3 mLs (2.5 mg total) by nebulization every 6 (six) hours as needed for wheezing or shortness of breath. 12/17/16   Argentina Donovan, PA-C  diclofenac sodium (VOLTAREN) 1 % GEL Apply 4 g topically daily as needed (for pain). 11/25/15   Funches, Adriana Mccallum, MD  fluticasone (FLONASE) 50 MCG/ACT nasal spray Place 2 sprays into both nostrils daily as needed for allergies or rhinitis. 01/07/17   Boykin Nearing, MD    Inpatient medications: . allopurinol  100 mg Oral Daily  . amLODipine  5 mg Oral Daily  . aspirin EC  81 mg Oral Daily  . atorvastatin  80 mg Oral q1800  . carvedilol  25 mg Oral BID WC  . colchicine  0.6 mg Oral Daily  . famotidine  20 mg Oral BID  . ferrous sulfate  325 mg Oral BID WC  . gabapentin  200 mg Oral TID  . hydrALAZINE  50 mg Oral TID  . insulin aspart  25 Units Subcutaneous TID AC  . insulin glargine  40 Units Subcutaneous BID  . isosorbide mononitrate  60 mg Oral Daily  . linagliptin  5 mg Oral Daily  . mometasone-formoterol  2 puff Inhalation BID  . pantoprazole  40 mg Oral Daily  . pregabalin  100 mg Oral TID  . sodium chloride flush  3 mL Intravenous Q12H  . ticagrelor  90 mg Oral BID  . valACYclovir  500 mg Oral Daily    Discontinued Meds:   Medications Discontinued During This Encounter  Medication Reason  . glucose blood (TRUE METRIX BLOOD GLUCOSE TEST) test strip Patient Preference  . Insulin Pen Needle (B-D ULTRAFINE III SHORT PEN) 31G X 8 MM MISC Patient Preference  . Insulin Syringes, Disposable, U-100 0.5 ML MISC Patient Preference  . Blood Glucose Monitoring Suppl (TRUE METRIX METER)  DEVI Error  . TRUEPLUS LANCETS 28G MISC Patient Preference  . pantoprazole (PROTONIX) EC tablet 40 mg   . albuterol (PROVENTIL HFA;VENTOLIN HFA) 108 (90 Base) MCG/ACT inhaler 2 puff Duplicate  . nitroGLYCERIN 50 mg in dextrose 5 % 250 mL (0.2 mg/mL) infusion     Social History:  reports that she quit smoking about 36 years ago. She has never used smokeless tobacco. She reports that she drinks alcohol. She reports that she does not use drugs.  Family History:   Family History  Problem Relation Age of Onset  . Colon cancer Mother   . Heart attack Other   . Heart attack Maternal Grandmother   . Hypertension Sister   . Hypertension Brother   . Diabetes Paternal Grandmother   . Breast cancer Neg Hx     Pertinent items are noted in HPI. Weight change:   Intake/Output Summary (Last 24 hours) at 03/18/17 1438 Last data filed at 03/18/17 1100  Gross per 24 hour  Intake          1114.73 ml  Output  400 ml  Net           714.73 ml   BP (!) 105/55   Pulse 84   Temp 98.2 F (36.8 C) (Oral)   Resp 16   Ht _0  (1.727 m)   Wt 135.7 kg (299 lb 1.6 oz)   SpO2 100%   BMI 45.48 kg/m  Vitals:   03/18/17 0456 03/18/17 0747 03/18/17 1023 03/18/17 1035  BP: 104/60 (!) 127/55 (!) 105/55   Pulse: 78 73  84  Resp: _1 Temp: 98 F (36.7 C) 98.2 F (36.8 C)    TempSrc:  Oral    SpO2: 100% 99%  100%  Weight: 135.7 kg (299 lb 1.6 oz)     Height:         General appearance: alert, cooperative and no distress Head: Normocephalic, without obvious abnormality, atraumatic Resp: clear to auscultation bilaterally Cardio: regular rate and rhythm, S1, S2 normal, no murmur, click, rub or gallop GI: soft, non-tender; bowel sounds normal; no masses,  no organomegaly Extremities: edema trace bilateral pretibial edema  Labs: Basic Metabolic Panel:  Recent Labs Lab 03/17/17 1035 03/17/17 1904 03/18/17 0012  NA 137 139 134*  K 4.2 3.9 4.3  CL 104 105 104  CO2 21* 21* 16*   GLUCOSE 352* 217* 289*  BUN 77* 77* 78*  CREATININE 2.81* 2.87* 2.97*  ALBUMIN  --  3.6  --   CALCIUM 8.9 8.9 8.6*   Liver Function Tests:  Recent Labs Lab 03/17/17 1904  AST 16  ALT 25  ALKPHOS 149*  BILITOT 0.5  PROT 7.5  ALBUMIN 3.6   No results for input(s): LIPASE, AMYLASE in the last 168 hours. No results for input(s): AMMONIA in the last 168 hours. CBC:  Recent Labs Lab 03/17/17 1035 03/17/17 1904 03/18/17 0012  WBC 10.0 9.6 11.2*  NEUTROABS  --  6.9  --   HGB 10.2* 10.2* 10.0*  HCT 32.1* 31.2* 30.8*  MCV 84.7 84.1 84.2  PLT 233 210 232   PT/INR: _2 (inr:5) Cardiac Enzymes: ) Recent Labs Lab 03/17/17 1300 03/17/17 1904 03/18/17 0012  TROPONINI <0.03 <0.03 <0.03   CBG:  Recent Labs Lab 03/17/17 2114 03/18/17 0748 03/18/17 1120  GLUCAP 242* 271* 136*    Iron Studies: No results for input(s): IRON, TIBC, TRANSFERRIN, FERRITIN in the last 168 hours.  Xrays/Other Studies: Dg Chest 2 View  Result Date: 03/17/2017 CLINICAL DATA:  Shortness of breath over the last week. Progressive worsening. Swelling of the lower extremities. EXAM: CHEST  2 VIEW COMPARISON:  12/12/2016 FINDINGS: Heart size is normal. Mediastinal shadows are normal. The vascularity is normal. No effusions. Lungs are clear. Ordinary mild spinal degenerative changes. IMPRESSION: No active disease. Electronically Signed   By: Nelson Chimes M.D.   On: 03/17/2017 11:10   Nm Pulmonary Perf And Vent  Result Date: 03/17/2017 CLINICAL DATA:  Chest pain and shortness of breath. EXAM: NUCLEAR MEDICINE VENTILATION - PERFUSION LUNG SCAN TECHNIQUE: Ventilation images were obtained in multiple projections using inhaled aerosol Tc-1mDTPA. Perfusion images were obtained in multiple projections after intravenous injection of Tc-932mAA. RADIOPHARMACEUTICALS:  31 mCi Technetium-995mPA aerosol inhalation and 4.2 mCi Technetium-90m60m IV COMPARISON:  None. FINDINGS: Ventilation: No focal  ventilation defect. Perfusion: No wedge shaped peripheral perfusion defects to suggest acute pulmonary embolism. IMPRESSION: Normal ventilation-perfusion lung scan. No evidence of pulmonary embolism. Electronically Signed   By: JameLorriane Shire.   On: 03/17/2017 17:27  Assessment/Plan: 1.  AKI/CKD stage 3-4- in setting of decompensated diastolic CHF and diuresis with concomitant ACE-inhibition.  Agree with holding lisinopril and follow UOP and Scr with IV lasix.  Will check FeNa as well as UPEP/SPEP and urine studies. 2. Unstable angina with h/o CAD - Cardiology would like to proceed with cardiac cath, however Scr is rising.  Agree with holding off for now and follow renal function. 3. Acute on chronic CHF- no significant response with IV lasix, however not in pulmonary edema.  Continue to follow.  Will also order UA with prot/creat due to history of 3+ proteinuria on UA in June 2018 as she may have nephrotic syndrome from diabetic nephropathy. 4. HTN- stable 5. OSA- would greatly benefit from CPAP, however is uninsured and not able to get CPAP machine despite severe OSA per study in 2016.  Consider initiation of CPAP as an inpatient if ok with primary team 6. DM- per primary 7. Morbid obesity 8. Anemia of chronic kidney disease- will check iron stores and likely initiate ESA therapy as an inpatient.   9. Urinary retention/hesitancy- check bladder scan and if PVR >300 place foley catheter.   Governor Rooks Felicia Both 03/18/2017, 2:38 PM

## 2017-03-18 NOTE — Progress Notes (Addendum)
Progress Note  Patient Name: [name] Date of Encounter: 03/18/2017  Primary Cardiologist: Dr. Burt Knack  Subjective   Intermittent chest pain and dyspnea. LE tender to touch.   Inpatient Medications    Scheduled Meds: . allopurinol  100 mg Oral Daily  . amLODipine  5 mg Oral Daily  . aspirin EC  81 mg Oral Daily  . atorvastatin  80 mg Oral q1800  . carvedilol  25 mg Oral BID WC  . colchicine  0.6 mg Oral Daily  . famotidine  20 mg Oral BID  . ferrous sulfate  325 mg Oral BID WC  . gabapentin  200 mg Oral TID  . hydrALAZINE  50 mg Oral TID  . insulin aspart  25 Units Subcutaneous TID AC  . insulin glargine  40 Units Subcutaneous BID  . linagliptin  5 mg Oral Daily  . mometasone-formoterol  2 puff Inhalation BID  . pantoprazole  40 mg Oral Daily  . pregabalin  100 mg Oral TID  . sodium chloride flush  3 mL Intravenous Q12H  . ticagrelor  90 mg Oral BID  . valACYclovir  500 mg Oral Daily   Continuous Infusions: . sodium chloride    . heparin 1,800 Units/hr (03/18/17 0720)  . nitroGLYCERIN 5 mcg/min (03/17/17 1259)   PRN Meds: sodium chloride, acetaminophen, acetaminophen-codeine, albuterol, fluticasone, hydrOXYzine, ketotifen, nitroGLYCERIN, ondansetron (ZOFRAN) IV, sodium chloride flush, technetium albumin aggregated, technetium TC 51M diethylenetriame-pentaacetic acid   Vital Signs    Vitals:   03/17/17 2106 03/18/17 0027 03/18/17 0456 03/18/17 0747  BP: 116/61 (!) 120/54 104/60 (!) 127/55  Pulse: 80 82 78 73  Resp: 20 (!) 22 18 17   Temp: 98 F (36.7 C) 98.1 F (36.7 C) 98 F (36.7 C) 98.2 F (36.8 C)  TempSrc:    Oral  SpO2: 100% 98% 100% 99%  Weight:   299 lb 1.6 oz (135.7 kg)   Height:        Intake/Output Summary (Last 24 hours) at 03/18/17 0857 Last data filed at 03/18/17 0457  Gross per 24 hour  Intake           648.98 ml  Output              400 ml  Net           248.98 ml   Filed Weights   03/17/17 1746 03/18/17 0456  Weight: 298 lb 8  oz (135.4 kg) 299 lb 1.6 oz (135.7 kg)    Telemetry    Sinus rhythm  - Personally Reviewed  ECG    SR QT/QTc 440/491 ms- Personally Reviewed  Physical Exam   GEN: obese female in no acute distress.   Neck: + JVD Cardiac: RRR, no murmurs, rubs, or gallops.  Respiratory: Clear to auscultation bilaterally. GI: Soft, nontender, non-distended  MS: trace BL LE edema; No deformity. Neuro:  Nonfocal  Psych: Normal affect   Labs    Chemistry Recent Labs Lab 03/17/17 1035 03/17/17 1904 03/18/17 0012  NA 137 139 134*  K 4.2 3.9 4.3  CL 104 105 104  CO2 21* 21* 16*  GLUCOSE 352* 217* 289*  BUN 77* 77* 78*  CREATININE 2.81* 2.87* 2.97*  CALCIUM 8.9 8.9 8.6*  PROT  --  7.5  --   ALBUMIN  --  3.6  --   AST  --  16  --   ALT  --  25  --   ALKPHOS  --  149*  --  BILITOT  --  0.5  --   GFRNONAA 17* 17* 16*  GFRAA 20* 20* 19*  ANIONGAP 12 13 14      Hematology Recent Labs Lab 03/17/17 1035 03/17/17 1904 03/18/17 0012  WBC 10.0 9.6 11.2*  RBC 3.79* 3.71* 3.66*  HGB 10.2* 10.2* 10.0*  HCT 32.1* 31.2* 30.8*  MCV 84.7 84.1 84.2  MCH 26.9 27.5 27.3  MCHC 31.8 32.7 32.5  RDW 16.2* 16.1* 16.2*  PLT 233 210 232    Cardiac Enzymes Recent Labs Lab 03/17/17 1300 03/17/17 1904 03/18/17 0012  TROPONINI <0.03 <0.03 <0.03    Recent Labs Lab 03/17/17 1131  TROPIPOC 0.00     BNP Recent Labs Lab 03/17/17 1300  BNP 18.8     DDimer No results for input(s): DDIMER in the last 168 hours.   Radiology    Dg Chest 2 View  Result Date: 03/17/2017 CLINICAL DATA:  Shortness of breath over the last week. Progressive worsening. Swelling of the lower extremities. EXAM: CHEST  2 VIEW COMPARISON:  12/12/2016 FINDINGS: Heart size is normal. Mediastinal shadows are normal. The vascularity is normal. No effusions. Lungs are clear. Ordinary mild spinal degenerative changes. IMPRESSION: No active disease. Electronically Signed   By: Nelson Chimes M.D.   On: 03/17/2017 11:10   Nm  Pulmonary Perf And Vent  Result Date: 03/17/2017 CLINICAL DATA:  Chest pain and shortness of breath. EXAM: NUCLEAR MEDICINE VENTILATION - PERFUSION LUNG SCAN TECHNIQUE: Ventilation images were obtained in multiple projections using inhaled aerosol Tc-72m DTPA. Perfusion images were obtained in multiple projections after intravenous injection of Tc-37m MAA. RADIOPHARMACEUTICALS:  31 mCi Technetium-61m DTPA aerosol inhalation and 4.2 mCi Technetium-37m MAA IV COMPARISON:  None. FINDINGS: Ventilation: No focal ventilation defect. Perfusion: No wedge shaped peripheral perfusion defects to suggest acute pulmonary embolism. IMPRESSION: Normal ventilation-perfusion lung scan. No evidence of pulmonary embolism. Electronically Signed   By: Lorriane Shire M.D.   On: 03/17/2017 17:27    Cardiac Studies   Echo 03/10/17 Study Conclusions  - Left ventricle: The cavity size was normal. Wall thickness was   normal. Systolic function was normal. The estimated ejection   fraction was in the range of 60% to 65%. Wall motion was normal;   there were no regional wall motion abnormalities. Doppler   parameters are consistent with abnormal left ventricular   relaxation (grade 1 diastolic dysfunction). - Aortic valve: Trileaflet; mildly thickened, mildly calcified   leaflets. There was mild stenosis. There was mild regurgitation.   Peak velocity (S): 265 cm/s. Mean gradient (S): 13 mm Hg. Valve   area (VTI): 1.82 cm^2. Valve area (Vmax): 1.25 cm^2. Valve area   (Vmean): 1.45 cm^2. - Mitral valve: Moderately calcified annulus. - Left atrium: The atrium was mildly dilated. - Tricuspid valve: There was trivial regurgitation  Patient Profile     61 y.o. female with hx of CAD, chronic combined CHF, CKD stage IV, OSA  Nonrheumatic aortic valve stenosis, DM  and HTN admitted from clinic with symptoms concerning for [place].   She suffered a non-STEMI in 7/16 complicated by acute heart failure. She ultimately underwent DES  to the LAD. Echo last week demonstrated normal LV function, mild diastolic dysfunction and mild aortic stenosis.  Assessment & Plan    1. Unstable angina with hx of CAD s/p DES to LAD 09/2016 - still has intermittent chest pain and SOB on IV nitro and IV heparin. Troponin negative. EKG without acute changes. VQ scan negative for PE. Plan for likely  cath this admission. However, worsening of Scr.  - She is compliant with medication  2. Acute on chronic combined CHF - Recent echo showed normal LVEF, mild diastolic CHF and mild AS. BNP 18.8. CXR without acute abnormality. Given IV lasix 80mg  x 1 yesterday. Net I & O +249. No significant improvement in symptoms. Negative VQ scan.   3. Acute on CKD, stage IV - Scr worsen to 2.97 from 2.87 with one dose of lasix. Patient has appointment with Dr. Posey Pronto next month to establish renal care. Likely needs consult while admit.   4. HTN - Stable  5. OSA - recently got CPAP  6. DM - on insulin.   Signed, Leanor Kail, PA  03/18/2017, 8:57 AM    History and all data above reviewed.  Patient examined.  I agree with the findings as above.  The patient has symptoms that are somewhat chronic.  She has atypical greater than typical chest pain.  She has dyspnea.  BNP is normal.   I would like to see her O2 recorded when she is walking.  This does not seem to be heart failure.  The patient exam reveals COR:RRR  ,  Lungs: Clear  ,  Abd: Positive bowel sounds, no rebound no guarding, Ext No edema  .  All available labs, radiology testing, previous records reviewed. Agree with documented assessment and plan. Chest pain:  No objective evidence for ischemia.  She would be high risk for further invasive testing.  I will stop the IV NTG and restart Imdur at higher dose.  I will check her ambulatory sats and consider PFTs and pulmonary consult.  CKD:  Renal consult.    Jeneen Rinks Nichollas Perusse  12:19 PM  03/18/2017

## 2017-03-18 NOTE — Progress Notes (Signed)
Patient ambulated about 60ft in hall. Maintained O2 sats at 99-100% on RA. Patient did feel SOB and lightheaded when walking. Patient had to take a few rest stops and had to use wheelchair to get back into the room. Will continue to monitor.

## 2017-03-19 ENCOUNTER — Inpatient Hospital Stay (HOSPITAL_COMMUNITY): Payer: Medicaid Other

## 2017-03-19 ENCOUNTER — Telehealth: Payer: Self-pay

## 2017-03-19 DIAGNOSIS — I5033 Acute on chronic diastolic (congestive) heart failure: Secondary | ICD-10-CM | POA: Diagnosis present

## 2017-03-19 LAB — PROTEIN ELECTROPHORESIS, SERUM
A/G Ratio: 0.8 (ref 0.7–1.7)
Albumin ELP: 3.3 g/dL (ref 2.9–4.4)
Alpha-1-Globulin: 0.3 g/dL (ref 0.0–0.4)
Alpha-2-Globulin: 1.1 g/dL — ABNORMAL HIGH (ref 0.4–1.0)
Beta Globulin: 1.1 g/dL (ref 0.7–1.3)
Gamma Globulin: 1.3 g/dL (ref 0.4–1.8)
Globulin, Total: 3.9 g/dL (ref 2.2–3.9)
Total Protein ELP: 7.2 g/dL (ref 6.0–8.5)

## 2017-03-19 LAB — RENAL FUNCTION PANEL
Albumin: 3.3 g/dL — ABNORMAL LOW (ref 3.5–5.0)
Anion gap: 9 (ref 5–15)
BUN: 88 mg/dL — ABNORMAL HIGH (ref 6–20)
CO2: 21 mmol/L — ABNORMAL LOW (ref 22–32)
Calcium: 8.4 mg/dL — ABNORMAL LOW (ref 8.9–10.3)
Chloride: 105 mmol/L (ref 101–111)
Creatinine, Ser: 3.2 mg/dL — ABNORMAL HIGH (ref 0.44–1.00)
GFR calc Af Amer: 17 mL/min — ABNORMAL LOW (ref >60)
GFR calc non Af Amer: 15 mL/min — ABNORMAL LOW (ref >60)
Glucose, Bld: 219 mg/dL — ABNORMAL HIGH (ref 65–99)
Phosphorus: 4.3 mg/dL (ref 2.5–4.6)
Potassium: 4.2 mmol/L (ref 3.5–5.1)
Sodium: 135 mmol/L (ref 135–145)

## 2017-03-19 LAB — CBC
HCT: 29.1 % — ABNORMAL LOW (ref 36.0–46.0)
Hemoglobin: 9.5 g/dL — ABNORMAL LOW (ref 12.0–15.0)
MCH: 27.3 pg (ref 26.0–34.0)
MCHC: 32.6 g/dL (ref 30.0–36.0)
MCV: 83.6 fL (ref 78.0–100.0)
Platelets: 221 10*3/uL (ref 150–400)
RBC: 3.48 MIL/uL — ABNORMAL LOW (ref 3.87–5.11)
RDW: 16.4 % — ABNORMAL HIGH (ref 11.5–15.5)
WBC: 8.2 10*3/uL (ref 4.0–10.5)

## 2017-03-19 LAB — SODIUM, URINE, RANDOM: Sodium, Ur: 28 mmol/L

## 2017-03-19 LAB — HEPARIN LEVEL (UNFRACTIONATED)
Heparin Unfractionated: 0.78 IU/mL — ABNORMAL HIGH (ref 0.30–0.70)
Heparin Unfractionated: 0.97 IU/mL — ABNORMAL HIGH (ref 0.30–0.70)

## 2017-03-19 LAB — PROTEIN / CREATININE RATIO, URINE
Creatinine, Urine: 95.79 mg/dL
Protein Creatinine Ratio: 0.43 mg/mg{Cre} — ABNORMAL HIGH (ref 0.00–0.15)
Total Protein, Urine: 41 mg/dL

## 2017-03-19 LAB — GLUCOSE, CAPILLARY
Glucose-Capillary: 228 mg/dL — ABNORMAL HIGH (ref 65–99)
Glucose-Capillary: 186 mg/dL — ABNORMAL HIGH (ref 65–99)
Glucose-Capillary: 228 mg/dL — ABNORMAL HIGH (ref 65–99)

## 2017-03-19 LAB — CREATININE, URINE, RANDOM: Creatinine, Urine: 94.44 mg/dL

## 2017-03-19 MED ORDER — HEPARIN (PORCINE) IN NACL 100-0.45 UNIT/ML-% IJ SOLN: 1450.0000 [IU]/h | INTRAMUSCULAR | Status: DC

## 2017-03-19 MED ORDER — ISOSORBIDE MONONITRATE ER 60 MG PO TB24: 60.0000 mg | ORAL_TABLET | Freq: Every day | ORAL | 11 refills | Status: DC

## 2017-03-19 MED ORDER — FAMOTIDINE 20 MG PO TABS: 20.0000 mg | ORAL_TABLET | Freq: Every day | ORAL | Status: DC

## 2017-03-19 MED ORDER — NITROGLYCERIN 0.4 MG SL SUBL: 0.4000 mg | SUBLINGUAL_TABLET | SUBLINGUAL | 12 refills | Status: DC | PRN

## 2017-03-19 MED ORDER — TRUEPLUS LANCETS 28G MISC: 1.0000 | Freq: Once | 0 refills | Status: AC

## 2017-03-19 MED ORDER — GLUCOSE BLOOD VI STRP: ORAL_STRIP | 12 refills | Status: DC

## 2017-03-19 MED ORDER — ALBUTEROL SULFATE (2.5 MG/3ML) 0.083% IN NEBU: 2.5000 mg | INHALATION_SOLUTION | Freq: Four times a day (QID) | RESPIRATORY_TRACT | 12 refills | Status: DC | PRN

## 2017-03-19 MED ORDER — TRUE METRIX METER DEVI: 1.0000 | Freq: Once | 0 refills | Status: AC

## 2017-03-19 NOTE — Progress Notes (Addendum)
Assessment/Plan: 1.  AKI/CKD stage 3-4- in setting of decompensated diastolic CHF and diuresis with concomitant ACE-inhibition, holding lisinopril & diuretics  2. Unstable angina with h/o CAD - Cardiology would like to proceed with cardiac cath, however Scr prohibitive  . 3. Acute on chronic CHF- no significant response with IV lasix, however not in pulmonary edema.   4. HTN- stable 5. Anemia of chronic kidney disease- w/ low iron      Subjective: Interval History: Stilll with DOE  Objective: Vital signs in last 24 hours: Temp:  [97.4 F (36.3 C)-98.1 F (36.7 C)] 98.1 F (36.7 C) (09/07 1238) Pulse Rate:  [68-86] 86 (09/07 1238) Resp:  [20-23] 23 (09/07 0418) BP: (125-142)/(55-117) 125/55 (09/07 1238) SpO2:  [95 %-100 %] 95 % (09/07 1238) Weight:  [137 kg (302 lb 1.6 oz)] 137 kg (302 lb 1.6 oz) (09/07 0418) Weight change: 1.633 kg (3 lb 9.6 oz)  Intake/Output from previous day: 09/06 0701 - 09/07 0700 In: 1389.8 [P.O.:822; I.V.:567.8] Out: 150 [Urine:150] Intake/Output this shift: No intake/output data recorded.  General appearance: alert and cooperative Neck: no adenopathy, no carotid bruit, no JVD, supple, symmetrical, trachea midline, thyroid not enlarged, symmetric, no tenderness/mass/nodules and large neck Resp: clear to auscultation bilaterally Chest wall: no tenderness Cardio: regular rate and rhythm, S1, S2 normal, no murmur, click, rub or gallop Extremities: edema 1+  Lab Results:  Recent Labs  03/18/17 0012 03/19/17 0341  WBC 11.2* 8.2  HGB 10.0* 9.5*  HCT 30.8* 29.1*  PLT 232 221   BMET:  Recent Labs  03/18/17 0012 03/19/17 0341  NA 134* 135  K 4.3 4.2  CL 104 105  CO2 16* 21*  GLUCOSE 289* 219*  BUN 78* 88*  CREATININE 2.97* 3.20*  CALCIUM 8.6* 8.4*   No results for input(s): PTH in the last 72 hours. Iron Studies:  Recent Labs  03/18/17 1609  IRON 93  TIBC 351  FERRITIN 168   Studies/Results: US Renal  Result Date:  03/19/2017 CLINICAL DATA:  Acute renal failure. EXAM: RENAL / URINARY TRACT ULTRASOUND COMPLETE COMPARISON:  Renal ultrasound 11/22/2014 FINDINGS: Right Kidney: Length: 12.6 cm. Increased renal echogenicity. Cyst in the mid upper kidney measures 11 mm. Additional small cyst on prior exam is not well seen. No solid mass or hydronephrosis visualized. Left Kidney: Length: 12.5 cm. Increased renal echogenicity. Cyst in the mid kidney measures 1.9 cm. Additional small cysts on prior exam are not as well seen. No solid mass or hydronephrosis visualized. Bladder: Appears normal for degree of bladder distention. IMPRESSION: 1. Increased renal echogenicity consistent with medical renal disease. No hydronephrosis. 2. Small bilateral renal cysts. Electronically Signed   By: Jeb Levering M.D.   On: 03/19/2017 02:47   Nm Pulmonary Perf And Vent  Result Date: 03/17/2017 CLINICAL DATA:  Chest pain and shortness of breath. EXAM: NUCLEAR MEDICINE VENTILATION - PERFUSION LUNG SCAN TECHNIQUE: Ventilation images were obtained in multiple projections using inhaled aerosol Tc-65m DTPA. Perfusion images were obtained in multiple projections after intravenous injection of Tc-37m MAA. RADIOPHARMACEUTICALS:  31 mCi Technetium-52m DTPA aerosol inhalation and 4.2 mCi Technetium-52m MAA IV COMPARISON:  None. FINDINGS: Ventilation: No focal ventilation defect. Perfusion: No wedge shaped peripheral perfusion defects to suggest acute pulmonary embolism. IMPRESSION: Normal ventilation-perfusion lung scan. No evidence of pulmonary embolism. Electronically Signed   By: Lorriane Shire M.D.   On: 03/17/2017 17:27    Scheduled: . allopurinol  100 mg Oral Daily  . amLODipine  5 mg Oral Daily  .  aspirin EC  81 mg Oral Daily  . atorvastatin  80 mg Oral q1800  . carvedilol  25 mg Oral BID WC  . colchicine  0.6 mg Oral Daily  . [START ON 03/20/2017] famotidine  20 mg Oral Daily  . feeding supplement (ENSURE ENLIVE)  237 mL Oral BID BM  .  ferrous sulfate  325 mg Oral BID WC  . gabapentin  200 mg Oral TID  . hydrALAZINE  50 mg Oral TID  . insulin aspart  25 Units Subcutaneous TID AC  . insulin glargine  40 Units Subcutaneous BID  . isosorbide mononitrate  60 mg Oral Daily  . linagliptin  5 mg Oral Daily  . mometasone-formoterol  2 puff Inhalation BID  . pantoprazole  40 mg Oral Daily  . pregabalin  100 mg Oral TID  . sodium chloride flush  3 mL Intravenous Q12H  . ticagrelor  90 mg Oral BID  . valACYclovir  500 mg Oral Daily    LOS: 2 days   Irasema Chalk C 03/19/2017,2:41 PM

## 2017-03-19 NOTE — Progress Notes (Signed)
Chaplain introduced herself to patient and wanted to talk about the AD.  Chaplain gave patient space to talk about many issues she is having right now with health, bills, loss of her own living quarters, having to move in with a sister who has stairs that are difficult for her to climb.  Patient feels overwhelmed with many many issues.  She does not talk suicidal.  She would benefit from coping mechanisms.  Chaplain asked patient if she had ever thought about giving herself permission to take care of herself?  Patient talks about taking care of others so much and wanting to live in a dark room herself.  Chaplain provided ministry of presence, active listening and prayer with and for patient.  Chaplain provided education around Advanced Directive and left paperwork for patient to read and complete if desired.      03/19/17 1208  Clinical Encounter Type  Visited With Patient;Family  Visit Type Initial;Psychological support;Social support;Spiritual support  Referral From Nurse;Physician  Consult/Referral To Chaplain  Spiritual Encounters  Spiritual Needs Emotional;Prayer

## 2017-03-19 NOTE — Discharge Summary (Signed)
Discharge Summary    Patient ID: [name],  MRN: [id number], DOB/AGE: 59-30-1957 59 y.o.  Admit date: 03/17/2017 Discharge date: 03/19/2017  Primary Care Provider: Boykin Nearing Primary Cardiologist: Dr Burt Knack  Discharge Diagnoses    Principal Problem:   Unstable angina [facility]) Active Problems:   Acute on chronic combined systolic and diastolic CHF, NYHA class 4 (HCC)   Anemia in stage 3 chronic kidney disease   Essential hypertension   CKD (chronic kidney disease) stage 4, GFR 15-29 ml/min (HCC)   Aortic stenosis   Allergies Allergies  Allergen Reactions  . Shellfish Allergy Anaphylaxis and Swelling  . Diazepam Other (See Comments)    "felt like out of body experience"  . Morphine And Related Itching    Diagnostic Studies/Procedures    ECHO 03/10/17 Study Conclusions - Left ventricle: The cavity size was normal. Wall thickness was normal. Systolic function was normal. The estimated ejection fraction was in the range of 60% to 65%. Wall motion was normal; there were no regional wall motion abnormalities. Doppler parameters are consistent with abnormal left ventricular relaxation (grade 1 diastolic dysfunction). - Aortic valve: Trileaflet; mildly thickened, mildly calcified leaflets. There was mild stenosis. There was mild regurgitation. Peak velocity (S): 265 cm/s. Mean gradient (S): 13 mm Hg. Valve area (VTI): 1.82 cm^2. Valve area (Vmax): 1.25 cm^2. Valve area (Vmean): 1.45 cm^2. - Mitral valve: Moderately calcified annulus. - Left atrium: The atrium was mildly dilated. - Tricuspid valve: There was trivial regurgitation. _____________   History of Present Illness     61 y.o. female with hx of CAD, chronic combined CHF, CKD stage IV, OSA Nonrheumatic aortic valve stenosis, DM and HTN admitted from clinic 09/05 with symptoms concerning for [place].   She had suffered a non-STEMI in 01/176 complicated by acute heart failure. She  ultimately underwent DES to the LAD. Echo 03/10/2017 demonstrated normal LV function, mild diastolic dysfunction and mild aortic stenosis.  Hospital Course     Consultants: Nephrology   1. Coronary artery disease involving native coronary artery of native heart with unstable angina pectoris [facility]) She presents with symptoms of chest pain and worsening shortness of breath. Her symptoms are concerning for unstable angina. Her ECG is not acute.  She is visibly uncomfortable in the exam room. She was admitted to [facility] (going to ED as there are no beds; via EMS). Serial Troponins were checked and were negative. Other labs, CMET, CBC, BNP, and CXR were not acute. She was started on IV NTG, IV Heparin. She was continued on ASA, Brilinta, statin, beta-blocker. Initially, cardiac catheterization was considered, but her renal function worsened and it was not performed. The decision was made to do medical management. Her Imdur was increased and she was not having any chest pain with ambulation.   There was also concern for PE, but VQ scan was negative for pulmonary embolism. She was given IV Lasix 80 mg x 1 dose as she was felt volume overloaded. However, her SOB did not improve significantly and her BNP was normal. CXR did not show CHF/edema.   2. Acute on chronic combined systolic (congestive) and diastolic (congestive) heart failure ([place]) She seemed to be volume overloaded as well.  Her weight is down 7 lbs since last seen.  Her exam was difficult and her lungs were clear.  But, overall, her exam suggested volume excess. She was continued on beta-blocker, hydralazine, nitrates. She got Lasix 80 mg IV x 1. However, her BUN/Cr increased so the  diuretics were held after that.    3. Nonrheumatic aortic valve stenosis Mild by recent echo. Follow  4. Essential hypertension The patient's blood pressure is controlled on her current regimen.     5. CKD (chronic kidney disease) stage 4, GFR 15-29 ml/min  (HCC) She was due to see Nephrology next week. However, with her worsening renal function, we consulted Nephrology during this admit.  Her ACE inhibitor is on hold. Dr Florene Glen saw her, and recommends continuing to hold lisinopril. A renal US was performed, results below. Small cysts and medical renal disease were seen. She is to follow up with nephrology as an outpatient.   6. Anemia Dr Florene Glen checked iron stores and will likely start ESA. She is to follow up with them. Of note, Iron studies are normal.   7. Diabetes Pt does not have a glucose meter or test strips. We are getting her prescriptions for this that she can fill at [facility] clinic. She is encouraged to stick to a diabetic diet.   8. OSA She had not previously been on CPAP, but it was supplied to her and she will be using it at home.  _____________  Discharge Vitals Blood pressure (!) 125/55, pulse 86, temperature 98.1 F (36.7 C), temperature source Oral, resp. rate (!) 23, height 5\' 8"  (1.727 m), weight (!) 302 lb 1.6 oz (137 kg), SpO2 95 %.  Filed Weights   03/17/17 1746 03/18/17 0456 03/19/17 0418  Weight: 298 lb 8 oz (135.4 kg) 299 lb 1.6 oz (135.7 kg) (!) 302 lb 1.6 oz (137 kg)    Labs & Radiologic Studies    CBC  Recent Labs  03/17/17 1904 03/18/17 0012 03/19/17 0341  WBC 9.6 11.2* 8.2  NEUTROABS 6.9  --   --   HGB 10.2* 10.0* 9.5*  HCT 31.2* 30.8* 29.1*  MCV 84.1 84.2 83.6  PLT 210 232 694   Basic Metabolic Panel  Recent Labs  03/18/17 0012 03/19/17 0341  NA 134* 135  K 4.3 4.2  CL 104 105  CO2 16* 21*  GLUCOSE 289* 219*  BUN 78* 88*  CREATININE 2.97* 3.20*  CALCIUM 8.6* 8.4*  PHOS  --  4.3   Liver Function Tests  Recent Labs  03/17/17 1904 03/19/17 0341  AST 16  --   ALT 25  --   ALKPHOS 149*  --   BILITOT 0.5  --   PROT 7.5  --   ALBUMIN 3.6 3.3*   Cardiac Enzymes  Recent Labs  03/17/17 1300 03/17/17 1904 03/18/17 0012  TROPONINI <0.03 <0.03 <0.03   BNP B Natriuretic Peptide   Date Value Ref Range Status  03/17/2017 18.8 0.0 - 100.0 pg/mL Final   Iron/TIBC/Ferritin/ %Sat    Component Value Date/Time   IRON 93 03/18/2017 1609   TIBC 351 03/18/2017 1609   FERRITIN 168 03/18/2017 1609   IRONPCTSAT 26 03/18/2017 1609   Lab Results  Component Value Date   HGBA1C 9.8 02/12/2017   _____________  Dg Chest 2 View Result Date: 03/17/2017 CLINICAL DATA:  Shortness of breath over the last week. Progressive worsening. Swelling of the lower extremities. EXAM: CHEST  2 VIEW COMPARISON:  12/12/2016 FINDINGS: Heart size is normal. Mediastinal shadows are normal. The vascularity is normal. No effusions. Lungs are clear. Ordinary mild spinal degenerative changes. IMPRESSION: No active disease. Electronically Signed   By: Nelson Chimes M.D.   On: 03/17/2017 11:10   US Renal Result Date: 03/19/2017 CLINICAL DATA:  Acute renal failure.  EXAM: RENAL / URINARY TRACT ULTRASOUND COMPLETE COMPARISON:  Renal ultrasound 11/22/2014 FINDINGS: Right Kidney: Length: 12.6 cm. Increased renal echogenicity. Cyst in the mid upper kidney measures 11 mm. Additional small cyst on prior exam is not well seen. No solid mass or hydronephrosis visualized. Left Kidney: Length: 12.5 cm. Increased renal echogenicity. Cyst in the mid kidney measures 1.9 cm. Additional small cysts on prior exam are not as well seen. No solid mass or hydronephrosis visualized. Bladder: Appears normal for degree of bladder distention. IMPRESSION: 1. Increased renal echogenicity consistent with medical renal disease. No hydronephrosis. 2. Small bilateral renal cysts. Electronically Signed   By: Jeb Levering M.D.   On: 03/19/2017 02:47   Nm Pulmonary Perf And Vent Result Date: 03/17/2017 CLINICAL DATA:  Chest pain and shortness of breath. EXAM: NUCLEAR MEDICINE VENTILATION - PERFUSION LUNG SCAN TECHNIQUE: Ventilation images were obtained in multiple projections using inhaled aerosol Tc-46m DTPA. Perfusion images were obtained in  multiple projections after intravenous injection of Tc-42m MAA. RADIOPHARMACEUTICALS:  31 mCi Technetium-43m DTPA aerosol inhalation and 4.2 mCi Technetium-65m MAA IV COMPARISON:  None. FINDINGS: Ventilation: No focal ventilation defect. Perfusion: No wedge shaped peripheral perfusion defects to suggest acute pulmonary embolism. IMPRESSION: Normal ventilation-perfusion lung scan. No evidence of pulmonary embolism. Electronically Signed   By: Lorriane Shire M.D.   On: 03/17/2017 17:27   Disposition   Pt is being discharged home today in good condition.  Follow-up Plans & Appointments    Follow-up Information    Sherren Mocha, MD Follow up.   Specialty:  Cardiology Why:  The office will call. Contact information: [address] [place]  [address] [place] [facility] Follow up on 03/29/2017.   Why:  @ 2:00 pm for hospital Follow up with Dr. Jarold Song.  Contact information: [place] [address] [phone]       Donato Heinz, MD Follow up on 04/01/2017.   Specialty:  Nephrology Why:  Please arrive at 2:30 pm for a 2:45 am appt. Contact information: [place] [id number] [phone]          Discharge Instructions    Diet - low sodium heart healthy    Complete by:  As directed    Diet Carb Modified    Complete by:  As directed    Increase activity slowly    Complete by:  As directed       Discharge Medications   Current Discharge Medication List    START taking these medications   Details  Blood Glucose Monitoring Suppl (TRUE METRIX METER) DEVI 1 Device by Does not apply route once. Qty: 1 Device, Refills: 0    glucose blood (TRUE METRIX BLOOD GLUCOSE TEST) test strip Use as instructed Qty: 100 each, Refills: 12    nitroGLYCERIN (NITROSTAT) 0.4 MG SL tablet Place 1 tablet (0.4 mg total) under the tongue every 5 (five) minutes x 3 doses as needed  for chest pain. Qty: 25 tablet, Refills: 12    TRUEPLUS LANCETS 28G MISC 1 Container by Does not apply route once. Qty: 1 each, Refills: 0      CONTINUE these medications which have CHANGED   Details  isosorbide mononitrate (IMDUR) 60 MG 24 hr tablet Take 1 tablet (60 mg total) by mouth daily. Qty: 30 tablet, Refills: 11      CONTINUE these medications which have NOT CHANGED   Details  acetaminophen-codeine (  TYLENOL #3) 300-30 MG tablet Take 1 tablet by mouth every 8 (eight) hours as needed for moderate pain. Qty: 60 tablet, Refills: 2   Associated Diagnoses: Acute idiopathic gout of right hand    albuterol (VENTOLIN HFA) 108 (90 Base) MCG/ACT inhaler Inhale 2 puffs into the lungs every 4 (four) hours as needed for wheezing or shortness of breath. Qty: 54 g, Refills: 3   Associated Diagnoses: Mild persistent asthma without complication    allopurinol (ZYLOPRIM) 100 MG tablet Take 1 tablet (100 mg total) by mouth daily. Qty: 30 tablet, Refills: 6   Associated Diagnoses: Chronic gout due to renal impairment of multiple sites without tophus    amLODipine (NORVASC) 5 MG tablet Take 5 mg by mouth daily. Refills: 11    aspirin EC 81 MG EC tablet Take 1 tablet (81 mg total) by mouth daily.   Associated Diagnoses: Uncontrolled type 2 diabetes mellitus with complication, without long-term current use of insulin ([place]); Uncomplicated asthma, unspecified asthma severity, unspecified whether persistent; Genital herpes simplex, unspecified site; Acute on chronic diastolic congestive heart failure, NYHA class 4 ([place]); Anemia in stage 3 chronic kidney disease; Essential hypertension; Coronary artery disease involving native coronary artery of native heart with unstable angina pectoris ([place]); Environmental allergies    atorvastatin (LIPITOR) 80 MG tablet Take 1 tablet (80 mg total) by mouth daily at 6 PM. Qty: 90 tablet, Refills: 3   Associated Diagnoses: Uncontrolled type 2 diabetes mellitus with  complication, without long-term current use of insulin ([place]); Uncomplicated asthma, unspecified asthma severity, unspecified whether persistent; Genital herpes simplex, unspecified site; Acute on chronic diastolic congestive heart failure, NYHA class 4 ([place]); Anemia in stage 3 chronic kidney disease; Essential hypertension; Coronary artery disease involving native coronary artery of native heart with unstable angina pectoris ([place]); Environmental allergies    carvedilol (COREG) 25 MG tablet TAKE 1 TABLET BY MOUTH 2 TIMES DAILY WITH A MEAL. Qty: 180 tablet, Refills: 3   Associated Diagnoses: Acute on chronic diastolic congestive heart failure, NYHA class 2 (HCC)    colchicine 0.6 MG tablet Take 1 tablet (0.6 mg total) by mouth daily. Qty: 30 tablet, Refills: 2    ferrous sulfate 325 (65 FE) MG tablet Take 1 tablet (325 mg total) by mouth 2 (two) times daily with a meal. Qty: 60 tablet, Refills: 3   Associated Diagnoses: Anemia in stage 3 chronic kidney disease    Fluticasone-Salmeterol (ADVAIR DISKUS) 250-50 MCG/DOSE AEPB Inhale 1 puff into the lungs 2 (two) times daily. Qty: 60 each, Refills: 3   Associated Diagnoses: Uncomplicated asthma, unspecified asthma severity, unspecified whether persistent    furosemide (LASIX) 40 MG tablet Take 2 pills (80 mg) in the morning and 40 mg in the afternoon Qty: 270 tablet, Refills: 3    hydrALAZINE (APRESOLINE) 50 MG tablet Take 1 tablet (50 mg total) by mouth 3 (three) times daily. Qty: 270 tablet, Refills: 3   Associated Diagnoses: Coronary artery disease involving native coronary artery of native heart with unstable angina pectoris (HCC)    hydrOXYzine (ATARAX/VISTARIL) 50 MG tablet TAKE 1 TABLET (50 MG TOTAL) BY MOUTH 3 (THREE) TIMES DAILY AS NEEDED. Qty: 90 tablet, Refills: 0   Associated Diagnoses: Environmental allergies    insulin aspart (NOVOLOG) 100 UNIT/ML injection Inject 25 Units into the skin 3 (three) times daily before meals. Qty: 3  vial, Refills: 5   Associated Diagnoses: Uncontrolled type 2 diabetes mellitus with complication, with long-term current use of insulin (HCC)    Insulin  Glargine (LANTUS SOLOSTAR) 100 UNIT/ML Solostar Pen Inject 40 Units into the skin 2 (two) times daily. Qty: 24 mL, Refills: 2   Associated Diagnoses: Uncontrolled type 2 diabetes mellitus with complication, with long-term current use of insulin (HCC)    ketotifen (ZADITOR) 0.025 % ophthalmic solution Place 1 drop into both eyes daily as needed (for dry eyes). Qty: 10 mL, Refills: 2   Associated Diagnoses: Environmental allergies    omeprazole (PRILOSEC) 20 MG capsule Take 1 capsule (20 mg total) by mouth daily. Qty: 90 capsule, Refills: 3   Associated Diagnoses: Gastroesophageal reflux disease without esophagitis    pregabalin (LYRICA) 100 MG capsule Take 1 capsule (100 mg total) by mouth 3 (three) times daily. Qty: 270 capsule, Refills: 3    ranitidine (ZANTAC) 150 MG tablet Take 1 tablet (150 mg total) by mouth 2 (two) times daily. Qty: 60 tablet, Refills: 3   Associated Diagnoses: Gastroesophageal reflux disease without esophagitis    sitaGLIPtin (JANUVIA) 25 MG tablet Take 1 tablet (25 mg total) by mouth daily. For PASS Qty: 90 tablet, Refills: 3   Associated Diagnoses: Uncontrolled type 2 diabetes mellitus with complication, with long-term current use of insulin (HCC)    ticagrelor (BRILINTA) 90 MG TABS tablet Take 1 tablet (90 mg total) by mouth 2 (two) times daily. Qty: 180 tablet, Refills: 3   Associated Diagnoses: Coronary artery disease involving native coronary artery of native heart with unstable angina pectoris (HCC)    valACYclovir (VALTREX) 500 MG tablet Take 1 tablet (500 mg total) by mouth daily. Qty: 90 tablet, Refills: 3   Associated Diagnoses: Herpes simplex infection of genitourinary system    albuterol (PROVENTIL) (2.5 MG/3ML) 0.083% nebulizer solution Take 3 mLs (2.5 mg total) by nebulization every 6 (six)  hours as needed for wheezing or shortness of breath. Qty: 150 mL, Refills: 1    diclofenac sodium (VOLTAREN) 1 % GEL Apply 4 g topically daily as needed (for pain). Qty: 100 g, Refills: 2   Associated Diagnoses: Trigger middle finger, left    fluticasone (FLONASE) 50 MCG/ACT nasal spray Place 2 sprays into both nostrils daily as needed for allergies or rhinitis. Qty: 16 g, Refills: 5      STOP taking these medications     lisinopril (PRINIVIL,ZESTRIL) 10 MG tablet           Outstanding Labs/Studies   Urine protein electrophoresis and Immunofixation are pending  Duration of Discharge Encounter   Greater than 30 minutes including physician time.  Jonetta Speak NP 03/19/2017, 4:42 PM  Patient seen and examined.  Plan as discussed in my rounding note for today and outlined above. Minus Breeding  03/19/2017  7:23 PM

## 2017-03-19 NOTE — Telephone Encounter (Signed)
Fax received from [facility] that referral was received and the patient has been scheduled to see Dr. Posey Pronto on 04/20/17.

## 2017-03-19 NOTE — Hospital Discharge Follow-Up (Signed)
Transitional Care Clinic Care Coordination Note:  Admit date:   03/17/2017 Discharge date:  03/19/2017 Discharge Disposition: home Patient contact:  # [phone] Emergency contact(s): [name] ( daughter) # [phone]  This Case Manager reviewed patient's EMR and determined patient would benefit from post-discharge medical management and chronic care management services through the [facility]. Patient has a history of CAD, CHF, CKD - stage 4, OSA, DM.  She was admitted with shortness of breath and chest pain.  She was treated for CAD with unstabel angina pectoris and acute on chronic combined systolic and diastolic heart failure.  She has 5 hospital admissions and 2 ED visits in the past year. . This Case Manager met with patient to discuss the services and medical management that can be provided at the [facility]. Patient verbalized understanding and agreed to receive post-discharge care at the [facility].   The patient has been seen in the TCC in the past  Patient scheduled for Transitional Care appointment on 03/29/17 @ 1400.  Clinic information and appointment time provided to patient. Appointment information also placed on AVS.  Assessment:       Home Environment:  Lives in 2 story home with her daughter. She stated that she has a hard time going up and down the stairs.        Support System: daughter and sister provide  support in the community.        Level of functioning: independent       Home DME: CPAP machine       Home care services: (services arranged prior to discharge or new services after discharge) - none at present       Transportation:  Daughter takes her to her appointments. Not interested in SCAT.        Food/Nutrition: (ability to afford, access, use of any community resources)can cook her own meals,  No problem reported accessing nutrition        Medications: (ability to afford, access, compliance, Pharmacy used): uses [facility] who works with her to help her obtain her medications.        Identified Barriers: no insurance, no income,large balance at [facility] pharmacy, needs a new glucometer.         PCP (Name, office location, phone number): has been followed by Dr Adrian Blackwater at [facility] who is no longer there.  Will need to re-establish care with another provider  Patient Education: reviewed services provided at [facility]  She has applied for disability and medicaid .               Arranged services:        Services communicated to/with  Heather Krauss, RN CM by Kellie Moor , [facility] TCC Coordinator who met with the patient

## 2017-03-19 NOTE — Progress Notes (Signed)
Patient received discharge information and acknowledged understanding. RN answered all questions. Patient IV was removed.

## 2017-03-19 NOTE — Progress Notes (Signed)
Nutrition Brief Note  Patient identified on the Malnutrition Screening Tool (MST) Report  Wt Readings from Last 15 Encounters:  03/19/17 (!) 302 lb 1.6 oz (137 kg)  03/17/17 297 lb (134.7 kg)  02/17/17 (!) 304 lb 6.4 oz (138.1 kg)  02/12/17 299 lb 3.2 oz (135.7 kg)  01/22/17 (!) 302 lb 9.6 oz (137.3 kg)  01/07/17 297 lb 12.8 oz (135.1 kg)  12/17/16 299 lb (135.6 kg)  12/14/16 298 lb 15.1 oz (135.6 kg)  11/17/16 (!) 301 lb 3.2 oz (136.6 kg)  11/10/16 295 lb 1.9 oz (133.9 kg)  10/26/16 293 lb 9.6 oz (133.2 kg)  10/07/16 297 lb (134.7 kg)  09/25/16 286 lb 9.6 oz (130 kg)  09/23/16 294 lb 3.2 oz (133.4 kg)  09/19/16 297 lb 13.5 oz (135.1 kg)    Body mass index is 45.93 kg/m. Patient meets criteria for morbid obesity based on current BMI. Pt reports a slight gain in weight status over the past couple months. Pt's weight history seems to be fluctuating.  Current diet order is heart healthy/carbohydrate modified, patient is consuming approximately 100% of meals, with 1 meal at 25% at this time. Pt reports this is r/t taste of the food not lack of hunger.   Nutrition focused physical exam completed. Findings include no fat or muscle depletion and no edema.  Pt reports liking Ensure and looking forward to supplementation to assist in regaining energy.  Labs and medications reviewed.   No nutrition interventions warranted at this time. If nutrition issues arise, please consult RD.   Parks Ranger, MS, RDN, LDN 03/19/2017 10:48 AM

## 2017-03-19 NOTE — Progress Notes (Signed)
ANTICOAGULATION CONSULT NOTE - F/u Consult  Pharmacy Consult for heparin Indication: chest pain/ACS  Allergies  Allergen Reactions  . Shellfish Allergy Anaphylaxis and Swelling  . Diazepam Other (See Comments)    "felt like out of body experience"  . Morphine And Related Itching    Patient Measurements: Height: 5\' 8"  (172.7 cm) Weight: (!) 302 lb 1.6 oz (137 kg) IBW/kg (Calculated) : 63.9 Heparin Dosing Weight: 96kg  Assessment: 61 yof with significant cardiac history presented from her cardiologists office with worsening SOB. Started IV heparin due to concern for unstable angina with possible cath needed this admission.   Heparin level supratherapeutic at 0.78. RN reports lab drawn in arm opposite the gtt. CBC stable and no bleeding per RN.   Goal of Therapy:  Heparin level 0.3-0.7 units/ml Monitor platelets by anticoagulation protocol: Yes   Plan:  Decrease heparin gtt to 1700 units/hr Heparin level in 8 hrs Daily heparin level and CBC Monitor for s/s bleeding  Argie Ramming, PharmD Clinical Pharmacist 03/19/17 5:07 AM

## 2017-03-19 NOTE — Progress Notes (Signed)
ANTICOAGULATION CONSULT NOTE - F/u Consult  Pharmacy Consult for heparin Indication: chest pain/ACS  Allergies  Allergen Reactions  . Shellfish Allergy Anaphylaxis and Swelling  . Diazepam Other (See Comments)    "felt like out of body experience"  . Morphine And Related Itching    Patient Measurements: Height: 5\' 8"  (172.7 cm) Weight: (!) 302 lb 1.6 oz (137 kg) IBW/kg (Calculated) : 63.9 Heparin Dosing Weight: 96kg  Assessment: 28 yof with significant cardiac history presented from her cardiologists office with worsening SOB. Started IV heparin due to concern for unstable angina with possible cath needed this admission.  -heparin level is up to 0.97 after decrease to 1700 units/hr  Goal of Therapy:  Heparin level 0.3-0.7 units/ml Monitor platelets by anticoagulation protocol: Yes   Plan: Hold heparin for 30 minuted  Decrease heparin gtt to 1450 units/hr Heparin level in 8 hrs Daily heparin level and CBC  Hildred Laser, Pharm D 03/19/2017 2:18 PM

## 2017-03-19 NOTE — Progress Notes (Signed)
Progress Note  Patient Name: [name] Date of Encounter: 03/19/2017  Primary Cardiologist:   Dr. Burt Knack  Subjective   She was dizzy while walking.  No chest pain.  She has chronic dyspnea with exertion.   Inpatient Medications    Scheduled Meds: . allopurinol  100 mg Oral Daily  . amLODipine  5 mg Oral Daily  . aspirin EC  81 mg Oral Daily  . atorvastatin  80 mg Oral q1800  . carvedilol  25 mg Oral BID WC  . colchicine  0.6 mg Oral Daily  . [START ON 03/20/2017] famotidine  20 mg Oral Daily  . feeding supplement (ENSURE ENLIVE)  237 mL Oral BID BM  . ferrous sulfate  325 mg Oral BID WC  . gabapentin  200 mg Oral TID  . hydrALAZINE  50 mg Oral TID  . insulin aspart  25 Units Subcutaneous TID AC  . insulin glargine  40 Units Subcutaneous BID  . isosorbide mononitrate  60 mg Oral Daily  . linagliptin  5 mg Oral Daily  . mometasone-formoterol  2 puff Inhalation BID  . pantoprazole  40 mg Oral Daily  . pregabalin  100 mg Oral TID  . sodium chloride flush  3 mL Intravenous Q12H  . ticagrelor  90 mg Oral BID  . valACYclovir  500 mg Oral Daily   Continuous Infusions: . sodium chloride    . heparin 1,700 Units/hr (03/19/17 1325)   PRN Meds: sodium chloride, acetaminophen, acetaminophen-codeine, albuterol, fluticasone, hydrOXYzine, ketotifen, nitroGLYCERIN, ondansetron (ZOFRAN) IV, sodium chloride flush, technetium albumin aggregated, technetium TC 62M diethylenetriame-pentaacetic acid   Vital Signs    Vitals:   03/19/17 0418 03/19/17 0842 03/19/17 0904 03/19/17 1238  BP: (!) 125/57  (!) 132/105 (!) 125/55  Pulse: 79  85 86  Resp: (!) 23     Temp: 98 F (36.7 C)  (!) 97.5 F (36.4 C) 98.1 F (36.7 C)  TempSrc:   Oral Oral  SpO2: 98% 99% 100% 95%  Weight: (!) 302 lb 1.6 oz (137 kg)     Height:        Intake/Output Summary (Last 24 hours) at 03/19/17 1325 Last data filed at 03/19/17 0500  Gross per 24 hour  Intake              924 ml  Output              150  ml  Net              774 ml   Filed Weights   03/17/17 1746 03/18/17 0456 03/19/17 0418  Weight: 298 lb 8 oz (135.4 kg) 299 lb 1.6 oz (135.7 kg) (!) 302 lb 1.6 oz (137 kg)    Telemetry    NSR,  - Personally Reviewed  ECG    NA - Personally Reviewed  Physical Exam   GEN: No acute distress.   Neck: No  JVD Cardiac: RRR, no murmurs, rubs, or gallops.  Respiratory: Clear  to auscultation bilaterally. GI: Soft, nontender, non-distended  MS: No  edema; No deformity. Neuro:  Nonfocal  Psych: Normal affect   Labs    Chemistry Recent Labs Lab 03/17/17 1904 03/18/17 0012 03/19/17 0341  NA 139 134* 135  K 3.9 4.3 4.2  CL 105 104 105  CO2 21* 16* 21*  GLUCOSE 217* 289* 219*  BUN 77* 78* 88*  CREATININE 2.87* 2.97* 3.20*  CALCIUM 8.9 8.6* 8.4*  PROT 7.5  --   --  ALBUMIN 3.6  --  3.3*  AST 16  --   --   ALT 25  --   --   ALKPHOS 149*  --   --   BILITOT 0.5  --   --   GFRNONAA 17* 16* 15*  GFRAA 20* 19* 17*  ANIONGAP 13 14 9      Hematology Recent Labs Lab 03/17/17 1904 03/18/17 0012 03/19/17 0341  WBC 9.6 11.2* 8.2  RBC 3.71* 3.66* 3.48*  HGB 10.2* 10.0* 9.5*  HCT 31.2* 30.8* 29.1*  MCV 84.1 84.2 83.6  MCH 27.5 27.3 27.3  MCHC 32.7 32.5 32.6  RDW 16.1* 16.2* 16.4*  PLT 210 232 221    Cardiac Enzymes Recent Labs Lab 03/17/17 1300 03/17/17 1904 03/18/17 0012  TROPONINI <0.03 <0.03 <0.03    Recent Labs Lab 03/17/17 1131  TROPIPOC 0.00     BNP Recent Labs Lab 03/17/17 1300  BNP 18.8     DDimer No results for input(s): DDIMER in the last 168 hours.   Radiology    US Renal  Result Date: 03/19/2017 CLINICAL DATA:  Acute renal failure. EXAM: RENAL / URINARY TRACT ULTRASOUND COMPLETE COMPARISON:  Renal ultrasound 11/22/2014 FINDINGS: Right Kidney: Length: 12.6 cm. Increased renal echogenicity. Cyst in the mid upper kidney measures 11 mm. Additional small cyst on prior exam is not well seen. No solid mass or hydronephrosis visualized. Left  Kidney: Length: 12.5 cm. Increased renal echogenicity. Cyst in the mid kidney measures 1.9 cm. Additional small cysts on prior exam are not as well seen. No solid mass or hydronephrosis visualized. Bladder: Appears normal for degree of bladder distention. IMPRESSION: 1. Increased renal echogenicity consistent with medical renal disease. No hydronephrosis. 2. Small bilateral renal cysts. Electronically Signed   By: Jeb Levering M.D.   On: 03/19/2017 02:47   Nm Pulmonary Perf And Vent  Result Date: 03/17/2017 CLINICAL DATA:  Chest pain and shortness of breath. EXAM: NUCLEAR MEDICINE VENTILATION - PERFUSION LUNG SCAN TECHNIQUE: Ventilation images were obtained in multiple projections using inhaled aerosol Tc-31m DTPA. Perfusion images were obtained in multiple projections after intravenous injection of Tc-53m MAA. RADIOPHARMACEUTICALS:  31 mCi Technetium-28m DTPA aerosol inhalation and 4.2 mCi Technetium-53m MAA IV COMPARISON:  None. FINDINGS: Ventilation: No focal ventilation defect. Perfusion: No wedge shaped peripheral perfusion defects to suggest acute pulmonary embolism. IMPRESSION: Normal ventilation-perfusion lung scan. No evidence of pulmonary embolism. Electronically Signed   By: Lorriane Shire M.D.   On: 03/17/2017 17:27    Cardiac Studies   ECHO 03/10/17  Study Conclusions  - Left ventricle: The cavity size was normal. Wall thickness was   normal. Systolic function was normal. The estimated ejection   fraction was in the range of 60% to 65%. Wall motion was normal;   there were no regional wall motion abnormalities. Doppler   parameters are consistent with abnormal left ventricular   relaxation (grade 1 diastolic dysfunction). - Aortic valve: Trileaflet; mildly thickened, mildly calcified   leaflets. There was mild stenosis. There was mild regurgitation.   Peak velocity (S): 265 cm/s. Mean gradient (S): 13 mm Hg. Valve   area (VTI): 1.82 cm^2. Valve area (Vmax): 1.25 cm^2. Valve  area   (Vmean): 1.45 cm^2. - Mitral valve: Moderately calcified annulus. - Left atrium: The atrium was mildly dilated. - Tricuspid valve: There was trivial regurgitation.   Patient Profile     61 y.o. female with hx of CAD, chronic combined CHF, CKD stage IV, OSA  Nonrheumatic aortic valve stenosis, DM  and HTN admitted from clinic with symptoms concerning for [place].   She suffered a non-STEMI in 3/33 complicated by acute heart failure. She ultimately underwent DES to the LAD. Echo last week demonstrated normal LV function, mild diastolic dysfunction and mild aortic stenosis.  Assessment & Plan    UNSTABLE ANGINA:  She has chest pain but has not had acute EKG changes or enzyme elevations.  Her creat is worsening and cardiac cath would be high risk.  Discussed with the patient.  In the absence of objective evidence of ischemia I would continue to manage this conservatively.  I increased her Imdur yesterday.  No further change in therapy.   ACUTE ON CHRONIC COMBINED SYSTOLIC AND DIASTOLIC HF:  Her BNP was normal. Her CXR without edema.  Her sats have been excellent on RA.  Ambulate and OK to discharge if sats are good on RA with ambulation.   Other evaluation of dyspnea per primary MD.  ACUTE STAGE IV CKD:  I appreciate renal seeing her.  Continue out patient work up and follow up.  Avoid any nephrotoxic med.    HTN:  Continue with meds as listed.   OSA:  She cannot afford CPAP.    ANEMIA:  This has been chronic.  Follow up per PCP.    AS:  Follow up per Dr. Burt Knack  DIZZINESS:  Check orthostatic BP before discharge.    Signed, Minus Breeding, MD  03/19/2017, 1:25 PM

## 2017-03-19 NOTE — Care Management Note (Addendum)
Case Management Note  Patient Details  Name: [name] MRN: [id number] Date of Birth: 28-Jan-1956  Subjective/Objective:   Pt presented for Unstable Angina- hx CAD, CHF and CKD. Plan for cardiac cath, however Cr elevated- plan to hold off until renal function is better. Pt had PCP- Dr Adrian Blackwater- no longer at the [facility]. Pt will need to be reestablished at the Clinic with a PCP.  Pt states she gets her medications at the [place] and cost ranges from $4.00-$10.00             Action/Plan: CM did check with patient and she has a CPAP that was provided from the [facility]. No further needs from CM at this time.   Expected Discharge Date:                  Expected Discharge Plan:  Home/Self Care  In-House Referral:  Financial Counselor, Hospital Follow Up Appointment Scheduled.   Discharge planning Services  CM Consult, [facility]  Post Acute Care Choice:  NA Choice offered to:  NA  DME Arranged:  N/A DME Agency:  NA  HH Arranged:  NA HH Agency:  NA  Status of Service:  Completed, signed off  If discussed at [place] of Stay Meetings, dates discussed:    Additional Comments: [place], RN,BSN [facility] follow up appointment scheduled and placed on the AVS. Pt states she now needs a glucose meter. Pt can get the Meter Free at the [facility]. True Metrix Glucose Meter [phone]) Test Strips ([id number])  50 for $10.00 and Lancets ( [id number]). Pt states she has most of her medication refills at home. No further needs from CM at this time.    Bethena Roys, RN 03/19/2017, 11:29 AM

## 2017-03-19 NOTE — Progress Notes (Signed)
Appt with Dr. Ambrose Pancoast at [facility] on Sept 20 at 2:45PM [place]  [facility]

## 2017-03-22 ENCOUNTER — Telehealth: Payer: Self-pay

## 2017-03-22 LAB — IMMUNOFIXATION, URINE

## 2017-03-22 MED FILL — NITROSTAT 0.4 MG TABLET SL: 0.4 | 25 days supply | Qty: 25 | Fill #0

## 2017-03-22 NOTE — Telephone Encounter (Signed)
Transitional [facility] Clinic Post-discharge Follow-Up Phone Call:  Date of Discharge:  03/19/2017 Principal Discharge Diagnosis(es):  Unstable angina, acute on chronic combined systolic and diastolic CHF, anemia Post-discharge Communication: (Clearly document all attempts clearly and date contact made) call placed to # [phone] and a HIPAA compliant voicemail message was left requesting a call back to # [phone] Call Completed: No

## 2017-03-23 ENCOUNTER — Telehealth: Payer: Self-pay

## 2017-03-23 NOTE — Telephone Encounter (Signed)
Transitional Care Clinic Post-discharge Follow-Up Phone Call:  Date of Discharge: 03/19/2017 Principal Discharge Diagnosis(es):  Unstable angina, acute on chronic combined systolic and diastolic CHF, anemia in stage 3 chronic kidney disease.  Post-discharge Communication: (Clearly document all attempts clearly and date contact made) call placed to # [phone] (M) and a HIPAA compliant voicemail message was left requesting a call back to # [phone]. Call Completed: No

## 2017-03-24 ENCOUNTER — Telehealth: Payer: Self-pay

## 2017-03-24 DIAGNOSIS — I350 Nonrheumatic aortic (valve) stenosis: Secondary | ICD-10-CM

## 2017-03-24 NOTE — Telephone Encounter (Signed)
Patient was seen 03/17/17 with SW. Repeat ECHO ordered to be scheduled in 1 year.

## 2017-03-24 NOTE — Telephone Encounter (Signed)
Transitional [facility] Clinic Post-discharge Follow-Up Phone Call:  Date of Discharge: 03/19/2017 Principal Discharge Diagnosis(es):  Unstable angina, acute on chronic combined systolic and diastolic CHF Post-discharge Communication: (Clearly document all attempts clearly and date contact made) call placed to # [phone] and a HIPAA compliant voicemail message was left requesting a call back to # [phone] Call Completed: No

## 2017-03-24 NOTE — Telephone Encounter (Signed)
-----   Message from Sherren Mocha, MD sent at 03/10/2017 11:43 PM EDT ----- Mild aortic stenosis. Normal LV function. Should have repeat echo in one year. Clinical FU as planned. thx

## 2017-03-25 ENCOUNTER — Telehealth: Payer: Self-pay | Admitting: Family Medicine

## 2017-03-25 NOTE — Telephone Encounter (Signed)
Transitional [facility] Clinic Post-discharge Follow-Up Phone Call:  Date of Discharge: 03/19/2017 Principal Discharge Diagnosis(es):  Unstable angina, acute on chronic combined systolic and diastolic CHF Post-discharge Communication: (Clearly document all attempts clearly and date contact made) call placed to # [phone]. No answer. Left patient a message asking her to return my call to # [phone]. Call Completed: No

## 2017-03-29 ENCOUNTER — Ambulatory Visit: Payer: Medicaid Other | Admitting: Physician Assistant

## 2017-03-29 ENCOUNTER — Inpatient Hospital Stay: Payer: Medicaid Other | Admitting: Family Medicine

## 2017-03-30 ENCOUNTER — Telehealth: Payer: Self-pay | Admitting: Family Medicine

## 2017-03-30 ENCOUNTER — Ambulatory Visit: Payer: Medicaid Other | Admitting: Physician Assistant

## 2017-03-30 ENCOUNTER — Encounter (HOSPITAL_COMMUNITY): Payer: Self-pay

## 2017-03-30 ENCOUNTER — Emergency Department (HOSPITAL_COMMUNITY)
Admission: EM | Admit: 2017-03-30 | Discharge: 2017-03-31 | Disposition: A | Discharge status: Home / Self Care | Payer: Medicaid Other | Attending: Emergency Medicine | Admitting: Emergency Medicine

## 2017-03-30 DIAGNOSIS — Z87891 Personal history of nicotine dependence: Secondary | ICD-10-CM | POA: Diagnosis not present

## 2017-03-30 DIAGNOSIS — I5043 Acute on chronic combined systolic (congestive) and diastolic (congestive) heart failure: Secondary | ICD-10-CM | POA: Insufficient documentation

## 2017-03-30 DIAGNOSIS — Z794 Long term (current) use of insulin: Secondary | ICD-10-CM | POA: Diagnosis not present

## 2017-03-30 DIAGNOSIS — M79672 Pain in left foot: Secondary | ICD-10-CM | POA: Diagnosis present

## 2017-03-30 DIAGNOSIS — E1122 Type 2 diabetes mellitus with diabetic chronic kidney disease: Secondary | ICD-10-CM | POA: Insufficient documentation

## 2017-03-30 DIAGNOSIS — I130 Hypertensive heart and chronic kidney disease with heart failure and stage 1 through stage 4 chronic kidney disease, or unspecified chronic kidney disease: Secondary | ICD-10-CM | POA: Insufficient documentation

## 2017-03-30 DIAGNOSIS — Z9049 Acquired absence of other specified parts of digestive tract: Secondary | ICD-10-CM | POA: Insufficient documentation

## 2017-03-30 DIAGNOSIS — I2510 Atherosclerotic heart disease of native coronary artery without angina pectoris: Secondary | ICD-10-CM | POA: Insufficient documentation

## 2017-03-30 DIAGNOSIS — N184 Chronic kidney disease, stage 4 (severe): Secondary | ICD-10-CM | POA: Diagnosis not present

## 2017-03-30 DIAGNOSIS — M7662 Achilles tendinitis, left leg: Secondary | ICD-10-CM | POA: Diagnosis not present

## 2017-03-30 DIAGNOSIS — J45909 Unspecified asthma, uncomplicated: Secondary | ICD-10-CM | POA: Insufficient documentation

## 2017-03-30 DIAGNOSIS — Z7982 Long term (current) use of aspirin: Secondary | ICD-10-CM | POA: Diagnosis not present

## 2017-03-30 DIAGNOSIS — Z79899 Other long term (current) drug therapy: Secondary | ICD-10-CM | POA: Insufficient documentation

## 2017-03-30 NOTE — Telephone Encounter (Signed)
Transitional Care Clinic Post-discharge Follow-Up Phone Call:  Date of Discharge:03/19/2017 Principal Discharge Diagnosis(es):Unstable angina, acute on chronic combined systolic and diastolic CHF Post-discharge Communication: (Clearly document all attempts clearly and date contact made) call placed to # [phone]. No answer. Left patient a message asking her to return my call to # [phone]. Call Completed: No

## 2017-03-30 NOTE — ED Triage Notes (Signed)
Pt states that her Gout started to flare up yesterday in her L foot and ankle, pt unable to bear weight due to the pain. Some swelling noted.

## 2017-03-31 ENCOUNTER — Emergency Department (HOSPITAL_COMMUNITY): Payer: Medicaid Other

## 2017-03-31 MED ORDER — FENTANYL CITRATE (PF) 100 MCG/2ML IJ SOLN
50.0000 ug | Freq: Once | INTRAMUSCULAR | Status: AC
  Administered 2017-03-31: 50 ug via INTRAMUSCULAR
  Filled 2017-03-31: qty 2

## 2017-03-31 MED ORDER — DICLOFENAC SODIUM 1 % TD GEL: 2.0000 g | Freq: Four times a day (QID) | TRANSDERMAL | 0 refills | Status: AC

## 2017-03-31 MED ORDER — DICLOFENAC SODIUM 1 % TD GEL
2.0000 g | Freq: Once | TRANSDERMAL | Status: DC
  Filled 2017-03-31: qty 100

## 2017-03-31 MED ORDER — OXYCODONE-ACETAMINOPHEN 5-325 MG PO TABS: 1.0000 | ORAL_TABLET | Freq: Three times a day (TID) | ORAL | 0 refills | Status: DC | PRN

## 2017-03-31 MED FILL — VALACYCLOVIR HCL 500 MG TAB: 500 | 30 days supply | Qty: 30 | Fill #1

## 2017-03-31 NOTE — ED Notes (Signed)
To x-Pope

## 2017-03-31 NOTE — ED Provider Notes (Signed)
[facility] DEPT Provider Note   CSN: [id number] Arrival date & time: 03/30/17  1854     History   Chief Complaint Chief Complaint  Patient presents with  . Gout    HPI [name] is a 61 y.o. female presenting with sudden onset progressively worsening pain to the back of her left heel. She reported a hx of gout in triage, but has never had this type of pain before or gout flares. Her gout flares are typically in her hand. She denies any injuries but has been walking around the store today and when removing her shoes afterward she began to experience the pain which has worsen significantly thereafter to the point of having difficulty putting weight on it. She had taken her sister and daughter's pain medication including tramadol, vicodin, naproxen and ibuprofen without relief.she has contacted her PCP office and the nurse advised her to stop taking medications and go to the ER.   HPI  Past Medical History:  Diagnosis Date  . Aortic stenosis    Echo 8/18: mean 13, peak 28, LVOT/AV mean velocity 0.51  . Asthma    As a child   . Bronchitis   . CAD (coronary artery disease)    a. 09/2016: 50% Ost 1st Mrg stenosis, 50% 2nd Mrg stenosis, 20% Mid-Cx, 95% Prox LAD, 40% mid-LAD, and 10% dist-LAD stenosis. Staged PCI with DES to Prox-LAD.   [name] Chronic combined systolic and diastolic CHF (congestive heart failure) ([place]) 2011   echo 2/18: EF 55-60, normal wall motion, grade 2 diastolic dysfunction, trivial AI // echo 3/18: Septal and apical HK, EF 45-50, normal wall motion, trivial AI, mild LAE, PASP 38 // echo 8/18: EF 60-65, normal wall motion, grade 1 diastolic dysfunction, calcified aortic valve leaflets, mild aortic stenosis (mean 13, peak 28, LVOT/AV mean velocity 0.51), mild AI, moderate MAC, mild LAE, trivial TR   . Complication of anesthesia   . Diabetes mellitus Dx 1989  . Hepatitis C Dx 2013  . Hypertension Dx 1989  . Obesity   . Pancreatitis 2013  . Refusal of blood  transfusions as patient is Jehovah's Witness   . Ulcer 2010    Patient Active Problem List   Diagnosis Date Noted  . Acute on chronic combined systolic and diastolic CHF, NYHA class 4 ([place]) 03/19/2017  . Unstable angina ([place]) 03/17/2017  . Aortic stenosis   . Herpes simplex infection of genitourinary system 02/12/2017  . Chronic pain of right knee 02/12/2017  . Chronic gout due to renal impairment of multiple sites without tophus 02/12/2017  . Esophageal dysphagia 02/12/2017  . Left hand pain 01/22/2017  . Diabetic hyperosmolar non-ketotic state ([place]) 12/13/2016  . CAD (coronary artery disease) 12/13/2016  . Hyperlipidemia 12/13/2016  . Hyperphosphatemia 12/13/2016  . History of non-ST elevation myocardial infarction (NSTEMI) 09/13/2016  . Acute myopericarditis   . Injury of left hand 02/24/2016  . Asthma 11/29/2015  . Trigger middle finger 11/25/2015  . Depression 09/05/2015  . GERD (gastroesophageal reflux disease) 03/25/2015  . Precordial pain   . Environmental allergies 03/14/2015  . Hep C w/o coma, chronic ([place]) 01/31/2015  . Diabetic neuropathy ([place]) 01/31/2015  . OSA (obstructive sleep apnea) 01/01/2015  . Poor dentition 11/13/2014  . Essential hypertension 10/08/2014  . Obesity 10/08/2014  . CKD (chronic kidney disease) stage 4, GFR 15-29 ml/min ([facility]) 10/08/2014  . Diabetes mellitus type 2, uncontrolled, with complications ([place]) 07/62/2633    Class: Chronic  . Chronic diastolic CHF (congestive heart failure),  NYHA class 2 ([place]) 03/25/2012  . Anemia in stage 3 chronic kidney disease 03/25/2012  . Chronic combined systolic and diastolic CHF (congestive heart failure) ([place]) 07/13/2009    Past Surgical History:  Procedure Laterality Date  . CHOLECYSTECTOMY    . CORONARY STENT INTERVENTION N/A 09/18/2016   Procedure: Coronary Stent Intervention;  Surgeon: Troy Sine, MD;  Location: [facility];  Service: Cardiovascular;  Laterality: N/A;  . EYE SURGERY    .  KNEE ARTHROSCOPY    . LEFT HEART CATH N/A 09/18/2016   Procedure: Left Heart Cath;  Surgeon: Troy Sine, MD;  Location: [facility];  Service: Cardiovascular;  Laterality: N/A;  . LEFT HEART CATH AND CORONARY ANGIOGRAPHY N/A 09/16/2016   Procedure: Left Heart Cath and Coronary Angiography;  Surgeon: Burnell Blanks, MD;  Location: [facility];  Service: Cardiovascular;  Laterality: N/A;  . TUBAL LIGATION    . TUBAL LIGATION      OB History    No data available       Home Medications    Prior to Admission medications   Medication Sig Start Date End Date Taking? Authorizing Provider  acetaminophen-codeine (TYLENOL #3) 300-30 MG tablet Take 1 tablet by mouth every 8 (eight) hours as needed for moderate pain. 01/07/17   Funches, Adriana Mccallum, MD  albuterol (PROVENTIL) (2.5 MG/3ML) 0.083% nebulizer solution Take 3 mLs (2.5 mg total) by nebulization every 6 (six) hours as needed for wheezing or shortness of breath. 12/17/16   Argentina Donovan, PA-C  albuterol (VENTOLIN HFA) 108 (90 Base) MCG/ACT inhaler Inhale 2 puffs into the lungs every 4 (four) hours as needed for wheezing or shortness of breath. 08/03/16   Boykin Nearing, MD  allopurinol (ZYLOPRIM) 100 MG tablet Take 1 tablet (100 mg total) by mouth daily. 02/12/17   Funches, Adriana Mccallum, MD  amLODipine (NORVASC) 5 MG tablet Take 5 mg by mouth daily. 01/15/17   [provider]  aspirin EC 81 MG EC tablet Take 1 tablet (81 mg total) by mouth daily. 09/19/16   Strader, Fransisco Hertz, PA-C  atorvastatin (LIPITOR) 80 MG tablet Take 1 tablet (80 mg total) by mouth daily at 6 PM. 02/12/17   Funches, Josalyn, MD  carvedilol (COREG) 25 MG tablet TAKE 1 TABLET BY MOUTH 2 TIMES DAILY WITH A MEAL. 02/12/17   Funches, Josalyn, MD  colchicine 0.6 MG tablet Take 1 tablet (0.6 mg total) by mouth daily. 12/17/16   Argentina Donovan, PA-C  diclofenac sodium (VOLTAREN) 1 % GEL Apply 2 g topically 4 (four) times daily. 03/31/17 04/07/17  Avie Echevaria  B, PA-C  ferrous sulfate 325 (65 FE) MG tablet Take 1 tablet (325 mg total) by mouth 2 (two) times daily with a meal. 12/17/16   McClung, Dionne Bucy, PA-C  fluticasone (FLONASE) 50 MCG/ACT nasal spray Place 2 sprays into both nostrils daily as needed for allergies or rhinitis. 01/07/17   Funches, Adriana Mccallum, MD  Fluticasone-Salmeterol (ADVAIR DISKUS) 250-50 MCG/DOSE AEPB Inhale 1 puff into the lungs 2 (two) times daily. Patient taking differently: Inhale 1 puff into the lungs 2 (two) times daily as needed (shortness of breath).  12/17/16   Argentina Donovan, PA-C  furosemide (LASIX) 40 MG tablet Take 2 pills (80 mg) in the morning and 40 mg in the afternoon Patient taking differently: Take 40-80 mg by mouth See admin instructions. Take 2 pills (80 mg) in the morning and 40 mg in the afternoon 02/17/17   Burt Knack,  Legrand Como, MD  glucose blood (TRUE METRIX BLOOD GLUCOSE TEST) test strip Use as instructed 03/19/17   Barrett, Evelene Croon, PA-C  hydrALAZINE (APRESOLINE) 50 MG tablet Take 1 tablet (50 mg total) by mouth 3 (three) times daily. 02/12/17   Funches, Adriana Mccallum, MD  hydrOXYzine (ATARAX/VISTARIL) 50 MG tablet TAKE 1 TABLET (50 MG TOTAL) BY MOUTH 3 (THREE) TIMES DAILY AS NEEDED. Patient taking differently: Take 50 mg by mouth 3 (three) times daily as needed for itching.  03/02/17   Tresa Garter, MD  insulin aspart (NOVOLOG) 100 UNIT/ML injection Inject 25 Units into the skin 3 (three) times daily before meals. 01/22/17   Funches, Adriana Mccallum, MD  Insulin Glargine (LANTUS SOLOSTAR) 100 UNIT/ML Solostar Pen Inject 40 Units into the skin 2 (two) times daily. 01/22/17   Funches, Adriana Mccallum, MD  isosorbide mononitrate (IMDUR) 60 MG 24 hr tablet Take 1 tablet (60 mg total) by mouth daily. 03/19/17   Barrett, Evelene Croon, PA-C  ketotifen (ZADITOR) 0.025 % ophthalmic solution Place 1 drop into both eyes daily as needed (for dry eyes). 07/27/16   Funches, Adriana Mccallum, MD  nitroGLYCERIN (NITROSTAT) 0.4 MG SL tablet Place 1 tablet (0.4 mg total)  under the tongue every 5 (five) minutes x 3 doses as needed for chest pain. 03/19/17   Barrett, Evelene Croon, PA-C  omeprazole (PRILOSEC) 20 MG capsule Take 1 capsule (20 mg total) by mouth daily. 02/12/17   Funches, Adriana Mccallum, MD  oxyCODONE-acetaminophen (PERCOCET/ROXICET) 5-325 MG tablet Take 1-2 tablets by mouth every 8 (eight) hours as needed for severe pain. 03/31/17   Avie Echevaria B, PA-C  pregabalin (LYRICA) 100 MG capsule Take 1 capsule (100 mg total) by mouth 3 (three) times daily. 03/12/17   Tresa Garter, MD  ranitidine (ZANTAC) 150 MG tablet Take 1 tablet (150 mg total) by mouth 2 (two) times daily. 12/17/16   Argentina Donovan, PA-C  sitaGLIPtin (JANUVIA) 25 MG tablet Take 1 tablet (25 mg total) by mouth daily. For PASS 12/17/16   Argentina Donovan, PA-C  ticagrelor (BRILINTA) 90 MG TABS tablet Take 1 tablet (90 mg total) by mouth 2 (two) times daily. 12/17/16   Argentina Donovan, PA-C  valACYclovir (VALTREX) 500 MG tablet Take 1 tablet (500 mg total) by mouth daily. 02/12/17   Boykin Nearing, MD    Family History Family History  Problem Relation Age of Onset  . Colon cancer Mother   . Heart attack Other   . Heart attack Maternal Grandmother   . Hypertension Sister   . Hypertension Brother   . Diabetes Paternal Grandmother   . Breast cancer Neg Hx     Social History Social History  Substance Use Topics  . Smoking status: Former Smoker    Quit date: 10/25/1980  . Smokeless tobacco: Never Used     Comment: quit smoking in 1982  . Alcohol use Yes     Comment: 3 times in last year     Allergies   Shellfish allergy; Diazepam; and Morphine and related   Review of Systems Review of Systems  Constitutional: Negative for chills and fever.  Eyes: Negative for photophobia, redness and visual disturbance.  Respiratory: Negative for cough and shortness of breath.   Cardiovascular: Negative for chest pain and palpitations.  Gastrointestinal: Negative for abdominal pain, nausea and  vomiting.  Genitourinary: Negative for difficulty urinating and dysuria.  Musculoskeletal: Positive for arthralgias and myalgias. Negative for back pain, joint swelling, neck pain and neck stiffness.  Skin: Negative for color  change, pallor, rash and wound.  Neurological: Negative for seizures, syncope, weakness and numbness.     Physical Exam Updated Vital Signs BP (!) 131/50 (BP Location: Right Arm)   Pulse 78   Temp 98.4 F (36.9 C) (Oral)   Resp 18   Ht _0  (1.727 m)   Wt 134.7 kg (297 lb)   SpO2 98%   BMI 45.16 kg/m   Physical Exam  Constitutional: She appears well-developed and well-nourished. No distress.  Afebrile, nontoxic-appearing, sitting in chair in discomfort  HENT:  Head: Normocephalic and atraumatic.  Eyes: Conjunctivae and EOM are normal.  Neck: Normal range of motion. Neck supple.  Cardiovascular: Normal rate, regular rhythm, normal heart sounds and intact distal pulses.   No murmur heard. Pulmonary/Chest: Effort normal and breath sounds normal. No respiratory distress. She has no wheezes. She has no rales.  Musculoskeletal: Normal range of motion. She exhibits tenderness. She exhibits no edema or deformity.  Negative Thompson test. Good motion of the Achilles tendon with compression of the gastrocnemius muscles. Increased pain with dorsiflexion. Tenderness to palpation of the Achilles tendon especially at calcaneal insertion site.   Neurological: She is alert. No sensory deficit.  Neurovascularly intact.  Skin: Skin is warm and dry. Capillary refill takes less than 2 seconds. No rash noted. She is not diaphoretic. No erythema. No pallor.  Psychiatric: She has a normal mood and affect.  Nursing note and vitals reviewed.    ED Treatments / Results  Labs (all labs ordered are listed, but only abnormal results are displayed) Labs Reviewed - No data to display  EKG  EKG Interpretation None       Radiology Dg Foot Complete Left  Result Date:  03/31/2017 CLINICAL DATA:  Pain over the Achilles. Unable to bear weight. No injury. EXAM: LEFT FOOT - COMPLETE 3+ VIEW COMPARISON:  None. FINDINGS: Small Achilles calcaneal spur. Old ununited ossicle adjacent to the navicular bone. Osteophyte versus ununited ossicle adjacent to the cuboidal bone. No acute displaced fractures identified. No focal bone lesion or bone destruction. Soft tissues are unremarkable. IMPRESSION: No acute bony abnormalities.  Small Achilles calcaneal spur. Electronically Signed   By: Lucienne Capers M.D.   On: 03/31/2017 00:54    Procedures Procedures (including critical care time)  Medications Ordered in ED Medications  diclofenac sodium (VOLTAREN) 1 % transdermal gel 2 g (2 g Topical Refused 03/31/17 0136)  fentaNYL (SUBLIMAZE) injection 50 mcg (50 mcg Intramuscular Given 03/31/17 0057)     Initial Impression / Assessment and Plan / ED Course  I have reviewed the triage vital signs and the nursing notes.  Pertinent labs & imaging results that were available during my care of the patient were reviewed by me and considered in my medical decision making (see chart for details).    Patient presenting with acute onset progressively worsening Achilles tendon pain. Pain worsen by dorsiflexion.  No erythema, warmth suggestive of an infectious process. Plain films reveal a small bone spur at the Achilles tendon insertion site consistent with achilles tendonitis.  Patient's pain was managed while in the emergency department and Ace wrap support applied. She was provided with crutches and analgesia. Discharge home with symptomatic relief and close PCP follow-up and orthopedic.  Discussed strict return precautions and advised to return to the emergency department if experiencing any new or worsening symptoms. Instructions were understood and patient agreed with discharge plan.  Final Clinical Impressions(s) / ED Diagnoses   Final diagnoses:  Achilles tendinitis, left leg  New Prescriptions New Prescriptions   DICLOFENAC SODIUM (VOLTAREN) 1 % GEL    Apply 2 g topically 4 (four) times daily.   OXYCODONE-ACETAMINOPHEN (PERCOCET/ROXICET) 5-325 MG TABLET    Take 1-2 tablets by mouth every 8 (eight) hours as needed for severe pain.     Emeline General, PA-C 03/31/17 0205    Merrily Pew, MD 04/01/17 [phone]

## 2017-03-31 NOTE — Discharge Instructions (Signed)
As discussed,  apply ice, support and rest your foot to allow healing. Follow up with orthopedics. Take pain medication only as needed for severe pain. Read the information provided on achilles tendonitis and bone spur.  Follow up with your primary care provider.  Return if new concerning symptoms or worsening in the meantime.

## 2017-03-31 NOTE — ED Notes (Signed)
Patient's pain unchanged. Understands to f/u with ortho. Understands to take medications as instructed.

## 2017-03-31 NOTE — ED Notes (Signed)
Explained delay to patient. Waiting on Ortho tech

## 2017-03-31 NOTE — Progress Notes (Signed)
Orthopedic Tech Progress Note Patient Details:  [name] 1956-05-31 [id number]  Ortho Devices Type of Ortho Device: Ace wrap, Crutches Ortho Device/Splint Location: lle ankle ace wrap Ortho Device/Splint Interventions: Ordered, Application, Adjustment   Karolee Stamps 03/31/2017, 3:34 AM

## 2017-03-31 NOTE — ED Notes (Signed)
Waiting for Ortho tech who is at [facility].

## 2017-04-02 ENCOUNTER — Telehealth: Payer: Self-pay | Admitting: Family Medicine

## 2017-04-02 NOTE — Telephone Encounter (Signed)
Returned patient's call. No answer. Left patient a message to return my call at [phone], and ask for Opal Sidles or I.

## 2017-04-02 NOTE — Telephone Encounter (Signed)
[facility] Post-discharge Follow-Up Phone Call:  Date of Discharge:03/19/2017 Principal Discharge Diagnosis(es):Unstable angina, acute on chronic combined systolic and diastolic CHF Post-discharge Communication: (Clearly document all attempts clearly and date contact made) call placed to # [phone]. No answer. Left patient a message asking her to return my call to# [phone]. Call Completed: No

## 2017-04-02 NOTE — Telephone Encounter (Signed)
Pt called to return somebody  call please f/up

## 2017-04-02 NOTE — Telephone Encounter (Signed)
Pt called to return CMA call please fu

## 2017-04-05 NOTE — Telephone Encounter (Signed)
Pt called back, please f/up

## 2017-04-11 ENCOUNTER — Emergency Department (HOSPITAL_COMMUNITY)
Admission: EM | Admit: 2017-04-11 | Discharge: 2017-04-12 | Disposition: A | Discharge status: Home / Self Care | Payer: Medicaid Other | Attending: Emergency Medicine | Admitting: Emergency Medicine

## 2017-04-11 ENCOUNTER — Emergency Department (HOSPITAL_COMMUNITY): Payer: Medicaid Other

## 2017-04-11 ENCOUNTER — Encounter (HOSPITAL_COMMUNITY): Payer: Self-pay | Admitting: Emergency Medicine

## 2017-04-11 DIAGNOSIS — M79671 Pain in right foot: Secondary | ICD-10-CM | POA: Insufficient documentation

## 2017-04-11 DIAGNOSIS — I2510 Atherosclerotic heart disease of native coronary artery without angina pectoris: Secondary | ICD-10-CM | POA: Insufficient documentation

## 2017-04-11 DIAGNOSIS — I130 Hypertensive heart and chronic kidney disease with heart failure and stage 1 through stage 4 chronic kidney disease, or unspecified chronic kidney disease: Secondary | ICD-10-CM | POA: Diagnosis not present

## 2017-04-11 DIAGNOSIS — J45909 Unspecified asthma, uncomplicated: Secondary | ICD-10-CM | POA: Insufficient documentation

## 2017-04-11 DIAGNOSIS — Z79899 Other long term (current) drug therapy: Secondary | ICD-10-CM | POA: Insufficient documentation

## 2017-04-11 DIAGNOSIS — E1122 Type 2 diabetes mellitus with diabetic chronic kidney disease: Secondary | ICD-10-CM | POA: Diagnosis not present

## 2017-04-11 DIAGNOSIS — Z794 Long term (current) use of insulin: Secondary | ICD-10-CM | POA: Insufficient documentation

## 2017-04-11 DIAGNOSIS — I5042 Chronic combined systolic (congestive) and diastolic (congestive) heart failure: Secondary | ICD-10-CM | POA: Diagnosis not present

## 2017-04-11 DIAGNOSIS — M10371 Gout due to renal impairment, right ankle and foot: Secondary | ICD-10-CM | POA: Diagnosis not present

## 2017-04-11 DIAGNOSIS — N184 Chronic kidney disease, stage 4 (severe): Secondary | ICD-10-CM | POA: Insufficient documentation

## 2017-04-11 DIAGNOSIS — Z7982 Long term (current) use of aspirin: Secondary | ICD-10-CM | POA: Diagnosis not present

## 2017-04-11 HISTORY — DX: Enthesopathy, unspecified: M77.9

## 2017-04-11 MED ORDER — OXYCODONE-ACETAMINOPHEN 5-325 MG PO TABS
1.0000 | ORAL_TABLET | Freq: Once | ORAL | Status: AC
  Administered 2017-04-11: 1 via ORAL
  Filled 2017-04-11: qty 1

## 2017-04-11 MED ORDER — FENTANYL CITRATE (PF) 100 MCG/2ML IJ SOLN
100.0000 ug | Freq: Once | INTRAMUSCULAR | Status: AC
  Administered 2017-04-12: 100 ug via INTRAMUSCULAR
  Filled 2017-04-11: qty 2

## 2017-04-11 MED ORDER — OXYCODONE-ACETAMINOPHEN 5-325 MG PO TABS: 1.0000 | ORAL_TABLET | Freq: Four times a day (QID) | ORAL | 0 refills | Status: DC | PRN

## 2017-04-11 MED ORDER — PREDNISONE 20 MG PO TABS: 20.0000 mg | ORAL_TABLET | Freq: Every day | ORAL | 0 refills | Status: DC

## 2017-04-11 NOTE — ED Provider Notes (Signed)
[name] Provider Note   CSN: [id number] Arrival date & time: 04/11/17  2144     History   Chief Complaint Chief Complaint  Patient presents with  . Foot Pain    HPI [name] is a 61 y.o. female.  HPI Patient presents to the emergency department with pain to the top of her right foot that extends into the ankle.  The patient states that pressure and palpation make the pain worse.  She states that she did not take any medications prior to arrival.  She states nothing seems to make the condition better. patient states that she was having problems with her left foot and then these symptoms started. Patient denies any nausea, vomiting, weakness, dizziness, fever, skin wounds, chest pain, shortness of breath, back pain, or syncope Past Medical History:  Diagnosis Date  . Aortic stenosis    Echo 8/18: mean 13, peak 28, LVOT/AV mean velocity 0.51  . Asthma    As a child   . Bronchitis   . CAD (coronary artery disease)    a. 09/2016: 50% Ost 1st Mrg stenosis, 50% 2nd Mrg stenosis, 20% Mid-Cx, 95% Prox LAD, 40% mid-LAD, and 10% dist-LAD stenosis. Staged PCI with DES to Prox-LAD.   [name] Chronic combined systolic and diastolic CHF (congestive heart failure) ([place]) 2011   echo 2/18: EF 55-60, normal wall motion, grade 2 diastolic dysfunction, trivial AI // echo 3/18: Septal and apical HK, EF 45-50, normal wall motion, trivial AI, mild LAE, PASP 38 // echo 8/18: EF 60-65, normal wall motion, grade 1 diastolic dysfunction, calcified aortic valve leaflets, mild aortic stenosis (mean 13, peak 28, LVOT/AV mean velocity 0.51), mild AI, moderate MAC, mild LAE, trivial TR   . Complication of anesthesia   . Diabetes mellitus Dx 1989  . Hepatitis C Dx 2013  . Hypertension Dx 1989  . Obesity   . Pancreatitis 2013  . Refusal of blood transfusions as patient is Jehovah's Witness   . Tendinitis   . Ulcer 2010    Patient Active Problem List   Diagnosis Date Noted  . Acute on chronic  combined systolic and diastolic CHF, NYHA class 4 ([place]) 03/19/2017  . Unstable angina ([place]) 03/17/2017  . Aortic stenosis   . Herpes simplex infection of genitourinary system 02/12/2017  . Chronic pain of right knee 02/12/2017  . Chronic gout due to renal impairment of multiple sites without tophus 02/12/2017  . Esophageal dysphagia 02/12/2017  . Left hand pain 01/22/2017  . Diabetic hyperosmolar non-ketotic state ([place]) 12/13/2016  . CAD (coronary artery disease) 12/13/2016  . Hyperlipidemia 12/13/2016  . Hyperphosphatemia 12/13/2016  . History of non-ST elevation myocardial infarction (NSTEMI) 09/13/2016  . Acute myopericarditis   . Injury of left hand 02/24/2016  . Asthma 11/29/2015  . Trigger middle finger 11/25/2015  . Depression 09/05/2015  . GERD (gastroesophageal reflux disease) 03/25/2015  . Precordial pain   . Environmental allergies 03/14/2015  . Hep C w/o coma, chronic ([place]) 01/31/2015  . Diabetic neuropathy ([place]) 01/31/2015  . OSA (obstructive sleep apnea) 01/01/2015  . Poor dentition 11/13/2014  . Essential hypertension 10/08/2014  . Obesity 10/08/2014  . CKD (chronic kidney disease) stage 4, GFR 15-29 ml/min ([facility]) 10/08/2014  . Diabetes mellitus type 2, uncontrolled, with complications ([place]) 66/12/3014    Class: Chronic  . Chronic diastolic CHF (congestive heart failure), NYHA class 2 ([place]) 03/25/2012  . Anemia in stage 3 chronic kidney disease 03/25/2012  . Chronic combined systolic and diastolic CHF (congestive  heart failure) ([place]) 07/13/2009    Past Surgical History:  Procedure Laterality Date  . CHOLECYSTECTOMY    . CORONARY STENT INTERVENTION N/A 09/18/2016   Procedure: Coronary Stent Intervention;  Surgeon: Troy Sine, MD;  Location: [facility];  Service: Cardiovascular;  Laterality: N/A;  . EYE SURGERY    . KNEE ARTHROSCOPY    . LEFT HEART CATH N/A 09/18/2016   Procedure: Left Heart Cath;  Surgeon: Troy Sine, MD;  Location: [facility];  Service: Cardiovascular;  Laterality: N/A;  . LEFT HEART CATH AND CORONARY ANGIOGRAPHY N/A 09/16/2016   Procedure: Left Heart Cath and Coronary Angiography;  Surgeon: Burnell Blanks, MD;  Location: [facility];  Service: Cardiovascular;  Laterality: N/A;  . TUBAL LIGATION    . TUBAL LIGATION      OB History    No data available       Home Medications    Prior to Admission medications   Medication Sig Start Date End Date Taking? Authorizing Provider  acetaminophen-codeine (TYLENOL #3) 300-30 MG tablet Take 1 tablet by mouth every 8 (eight) hours as needed for moderate pain. 01/07/17   Funches, Adriana Mccallum, MD  albuterol (PROVENTIL) (2.5 MG/3ML) 0.083% nebulizer solution Take 3 mLs (2.5 mg total) by nebulization every 6 (six) hours as needed for wheezing or shortness of breath. 12/17/16   Argentina Donovan, PA-C  albuterol (VENTOLIN HFA) 108 (90 Base) MCG/ACT inhaler Inhale 2 puffs into the lungs every 4 (four) hours as needed for wheezing or shortness of breath. 08/03/16   Boykin Nearing, MD  allopurinol (ZYLOPRIM) 100 MG tablet Take 1 tablet (100 mg total) by mouth daily. 02/12/17   Funches, Adriana Mccallum, MD  amLODipine (NORVASC) 5 MG tablet Take 5 mg by mouth daily. 01/15/17   [provider]  aspirin EC 81 MG EC tablet Take 1 tablet (81 mg total) by mouth daily. 09/19/16   Strader, Fransisco Hertz, PA-C  atorvastatin (LIPITOR) 80 MG tablet Take 1 tablet (80 mg total) by mouth daily at 6 PM. 02/12/17   Funches, Josalyn, MD  carvedilol (COREG) 25 MG tablet TAKE 1 TABLET BY MOUTH 2 TIMES DAILY WITH A MEAL. 02/12/17   Funches, Josalyn, MD  colchicine 0.6 MG tablet Take 1 tablet (0.6 mg total) by mouth daily. 12/17/16   Argentina Donovan, PA-C  ferrous sulfate 325 (65 FE) MG tablet Take 1 tablet (325 mg total) by mouth 2 (two) times daily with a meal. 12/17/16   McClung, Dionne Bucy, PA-C  fluticasone (FLONASE) 50 MCG/ACT nasal spray Place 2 sprays into both nostrils daily as needed for allergies  or rhinitis. 01/07/17   Funches, Adriana Mccallum, MD  Fluticasone-Salmeterol (ADVAIR DISKUS) 250-50 MCG/DOSE AEPB Inhale 1 puff into the lungs 2 (two) times daily. Patient taking differently: Inhale 1 puff into the lungs 2 (two) times daily as needed (shortness of breath).  12/17/16   Argentina Donovan, PA-C  furosemide (LASIX) 40 MG tablet Take 2 pills (80 mg) in the morning and 40 mg in the afternoon Patient taking differently: Take 40-80 mg by mouth See admin instructions. Take 2 pills (80 mg) in the morning and 40 mg in the afternoon 02/17/17   Sherren Mocha, MD  glucose blood (TRUE METRIX BLOOD GLUCOSE TEST) test strip Use as instructed 03/19/17   Barrett, Evelene Croon, PA-C  hydrALAZINE (APRESOLINE) 50 MG tablet Take 1 tablet (50 mg total) by mouth 3 (three) times daily. 02/12/17   Boykin Nearing, MD  hydrOXYzine (ATARAX/VISTARIL) 50 MG tablet TAKE 1 TABLET (50 MG TOTAL) BY MOUTH 3 (THREE) TIMES DAILY AS NEEDED. Patient taking differently: Take 50 mg by mouth 3 (three) times daily as needed for itching.  03/02/17   Tresa Garter, MD  insulin aspart (NOVOLOG) 100 UNIT/ML injection Inject 25 Units into the skin 3 (three) times daily before meals. 01/22/17   Funches, Adriana Mccallum, MD  Insulin Glargine (LANTUS SOLOSTAR) 100 UNIT/ML Solostar Pen Inject 40 Units into the skin 2 (two) times daily. 01/22/17   Funches, Adriana Mccallum, MD  isosorbide mononitrate (IMDUR) 60 MG 24 hr tablet Take 1 tablet (60 mg total) by mouth daily. 03/19/17   Barrett, Evelene Croon, PA-C  ketotifen (ZADITOR) 0.025 % ophthalmic solution Place 1 drop into both eyes daily as needed (for dry eyes). 07/27/16   Funches, Adriana Mccallum, MD  nitroGLYCERIN (NITROSTAT) 0.4 MG SL tablet Place 1 tablet (0.4 mg total) under the tongue every 5 (five) minutes x 3 doses as needed for chest pain. 03/19/17   Barrett, Evelene Croon, PA-C  omeprazole (PRILOSEC) 20 MG capsule Take 1 capsule (20 mg total) by mouth daily. 02/12/17   Funches, Adriana Mccallum, MD  oxyCODONE-acetaminophen  (PERCOCET/ROXICET) 5-325 MG tablet Take 1-2 tablets by mouth every 8 (eight) hours as needed for severe pain. 03/31/17   Avie Echevaria B, PA-C  pregabalin (LYRICA) 100 MG capsule Take 1 capsule (100 mg total) by mouth 3 (three) times daily. 03/12/17   Tresa Garter, MD  ranitidine (ZANTAC) 150 MG tablet Take 1 tablet (150 mg total) by mouth 2 (two) times daily. 12/17/16   Argentina Donovan, PA-C  sitaGLIPtin (JANUVIA) 25 MG tablet Take 1 tablet (25 mg total) by mouth daily. For PASS 12/17/16   Argentina Donovan, PA-C  ticagrelor (BRILINTA) 90 MG TABS tablet Take 1 tablet (90 mg total) by mouth 2 (two) times daily. 12/17/16   Argentina Donovan, PA-C  valACYclovir (VALTREX) 500 MG tablet Take 1 tablet (500 mg total) by mouth daily. 02/12/17   Boykin Nearing, MD    Family History Family History  Problem Relation Age of Onset  . Colon cancer Mother   . Heart attack Other   . Heart attack Maternal Grandmother   . Hypertension Sister   . Hypertension Brother   . Diabetes Paternal Grandmother   . Breast cancer Neg Hx     Social History Social History  Substance Use Topics  . Smoking status: Former Smoker    Quit date: 10/25/1980  . Smokeless tobacco: Never Used     Comment: quit smoking in 1982  . Alcohol use Yes     Comment: 3 times in last year     Allergies   Shellfish allergy; Diazepam; and Morphine and related   Review of Systems Review of Systems All other systems negative except as documented in the HPI. All pertinent positives and negatives as reviewed in the HPI.  Physical Exam Updated Vital Signs BP (!) 158/75 (BP Location: Right Arm)   Pulse 98   Temp 98.3 F (36.8 C) (Oral)   Resp 18   Ht 5' 8"  (1.727 m)   Wt 131.5 kg (290 lb)   SpO2 100%   BMI 44.09 kg/m   Physical Exam  Constitutional: She is oriented to person, place, and time. She appears well-developed and well-nourished. No distress.  HENT:  Head: Normocephalic and atraumatic.  Eyes: Pupils are  equal, round, and reactive to light.  Pulmonary/Chest: Effort normal.  Musculoskeletal:  Feet:  Neurological: She is alert and oriented to person, place, and time.  Skin: Skin is warm and dry.  Psychiatric: She has a normal mood and affect.  Nursing note and vitals reviewed.    ED Treatments / Results  Labs (all labs ordered are listed, but only abnormal results are displayed) Labs Reviewed - No data to display  EKG  EKG Interpretation None       Radiology No results found.  Procedures Procedures (including critical care time)  Medications Ordered in ED Medications  oxyCODONE-acetaminophen (PERCOCET/ROXICET) 5-325 MG per tablet 1 tablet (not administered)     Initial Impression / Assessment and Plan / ED Course  I have reviewed the triage vital signs and the nursing notes.  Pertinent labs & imaging results that were available during my care of the patient were reviewed by me and considered in my medical decision making (see chart for details).     I feel that the patient most likely has a gouty flare that is causing this severe pain.  As there is no signs of cellulitis or skin infection.  The patient will be treated for this.  Told to follow up with her primary doctor, told to return here as needed.  Patient agrees the plan and all questions were answered.   Final Clinical Impressions(s) / ED Diagnoses   Final diagnoses:  None    New Prescriptions New Prescriptions   No medications on file     Rebeca Allegra 04/11/17 2323    Fatima Blank, MD 04/12/17 [phone]

## 2017-04-11 NOTE — Discharge Instructions (Signed)
You may need to use a walker to help assist in ambulation.  Your x-rays did not show any abnormalities.  This seems like it could be a flare up of an acute gout situation

## 2017-04-11 NOTE — ED Triage Notes (Signed)
Pt c/o severe pain to R dorsal foot x 4 days. Pt states she has been resting d/t recent dx of tendinitis in L foot, Thursday she had cramping to R foot and pain has persisted since that time.  Pt took last percocet she had last night.

## 2017-04-12 ENCOUNTER — Telehealth: Payer: Self-pay

## 2017-04-12 NOTE — ED Notes (Signed)
PT states understanding of care given, follow up care, and medication prescribed. PT ambulated from ED to car with a steady gait. 

## 2017-04-12 NOTE — Telephone Encounter (Signed)
Pt  Called to ask to speak with the nurse, is about the hospital follow up, please follow up

## 2017-04-13 NOTE — Telephone Encounter (Signed)
Left message on voicemail to return call.

## 2017-04-13 NOTE — Telephone Encounter (Addendum)
Spoke to patient who stated she was trying to reach [place] and return her call. She wanted an appointment for her foot pain. Scheduled appointment at [facility] 04/14/2017. Pt aware, states she will show for appointment.

## 2017-04-14 ENCOUNTER — Other Ambulatory Visit: Payer: Self-pay | Admitting: Pharmacist

## 2017-04-14 ENCOUNTER — Encounter: Payer: Self-pay | Admitting: Family Medicine

## 2017-04-14 ENCOUNTER — Ambulatory Visit: Payer: Medicaid Other | Attending: Family Medicine | Admitting: Family Medicine

## 2017-04-14 VITALS — BP 169/64 | HR 85 | Temp 98.2°F | Ht 68.0 in | Wt 300.4 lb

## 2017-04-14 DIAGNOSIS — Z794 Long term (current) use of insulin: Secondary | ICD-10-CM | POA: Diagnosis not present

## 2017-04-14 DIAGNOSIS — Z888 Allergy status to other drugs, medicaments and biological substances status: Secondary | ICD-10-CM | POA: Diagnosis not present

## 2017-04-14 DIAGNOSIS — J45909 Unspecified asthma, uncomplicated: Secondary | ICD-10-CM | POA: Diagnosis not present

## 2017-04-14 DIAGNOSIS — I130 Hypertensive heart and chronic kidney disease with heart failure and stage 1 through stage 4 chronic kidney disease, or unspecified chronic kidney disease: Secondary | ICD-10-CM | POA: Insufficient documentation

## 2017-04-14 DIAGNOSIS — Z7982 Long term (current) use of aspirin: Secondary | ICD-10-CM | POA: Insufficient documentation

## 2017-04-14 DIAGNOSIS — I060 Rheumatic aortic stenosis: Secondary | ICD-10-CM | POA: Diagnosis not present

## 2017-04-14 DIAGNOSIS — E1165 Type 2 diabetes mellitus with hyperglycemia: Secondary | ICD-10-CM | POA: Diagnosis present

## 2017-04-14 DIAGNOSIS — I2510 Atherosclerotic heart disease of native coronary artery without angina pectoris: Secondary | ICD-10-CM | POA: Diagnosis not present

## 2017-04-14 DIAGNOSIS — E669 Obesity, unspecified: Secondary | ICD-10-CM | POA: Diagnosis not present

## 2017-04-14 DIAGNOSIS — Z91013 Allergy to seafood: Secondary | ICD-10-CM | POA: Insufficient documentation

## 2017-04-14 DIAGNOSIS — A6000 Herpesviral infection of urogenital system, unspecified: Secondary | ICD-10-CM | POA: Diagnosis not present

## 2017-04-14 DIAGNOSIS — Z6841 Body Mass Index (BMI) 40.0 and over, adult: Secondary | ICD-10-CM | POA: Diagnosis not present

## 2017-04-14 DIAGNOSIS — M1A9XX0 Chronic gout, unspecified, without tophus (tophi): Secondary | ICD-10-CM | POA: Insufficient documentation

## 2017-04-14 DIAGNOSIS — B1920 Unspecified viral hepatitis C without hepatic coma: Secondary | ICD-10-CM | POA: Diagnosis not present

## 2017-04-14 DIAGNOSIS — I10 Essential (primary) hypertension: Secondary | ICD-10-CM

## 2017-04-14 DIAGNOSIS — I5042 Chronic combined systolic (congestive) and diastolic (congestive) heart failure: Secondary | ICD-10-CM | POA: Insufficient documentation

## 2017-04-14 DIAGNOSIS — D631 Anemia in chronic kidney disease: Secondary | ICD-10-CM

## 2017-04-14 DIAGNOSIS — M1A39X0 Chronic gout due to renal impairment, multiple sites, without tophus (tophi): Secondary | ICD-10-CM | POA: Diagnosis not present

## 2017-04-14 DIAGNOSIS — IMO0002 Reserved for concepts with insufficient information to code with codable children: Secondary | ICD-10-CM

## 2017-04-14 DIAGNOSIS — Z9119 Patient's noncompliance with other medical treatment and regimen: Secondary | ICD-10-CM | POA: Insufficient documentation

## 2017-04-14 DIAGNOSIS — Z885 Allergy status to narcotic agent status: Secondary | ICD-10-CM | POA: Diagnosis not present

## 2017-04-14 DIAGNOSIS — M19041 Primary osteoarthritis, right hand: Secondary | ICD-10-CM | POA: Diagnosis not present

## 2017-04-14 DIAGNOSIS — E1122 Type 2 diabetes mellitus with diabetic chronic kidney disease: Secondary | ICD-10-CM | POA: Diagnosis not present

## 2017-04-14 DIAGNOSIS — E118 Type 2 diabetes mellitus with unspecified complications: Secondary | ICD-10-CM | POA: Diagnosis not present

## 2017-04-14 DIAGNOSIS — N184 Chronic kidney disease, stage 4 (severe): Secondary | ICD-10-CM | POA: Diagnosis not present

## 2017-04-14 DIAGNOSIS — I061 Rheumatic aortic insufficiency: Secondary | ICD-10-CM | POA: Diagnosis present

## 2017-04-14 DIAGNOSIS — E1121 Type 2 diabetes mellitus with diabetic nephropathy: Secondary | ICD-10-CM | POA: Diagnosis not present

## 2017-04-14 DIAGNOSIS — K219 Gastro-esophageal reflux disease without esophagitis: Secondary | ICD-10-CM

## 2017-04-14 DIAGNOSIS — Z9049 Acquired absence of other specified parts of digestive tract: Secondary | ICD-10-CM | POA: Insufficient documentation

## 2017-04-14 DIAGNOSIS — N183 Chronic kidney disease, stage 3 unspecified: Secondary | ICD-10-CM

## 2017-04-14 DIAGNOSIS — B351 Tinea unguium: Secondary | ICD-10-CM | POA: Insufficient documentation

## 2017-04-14 DIAGNOSIS — Z9851 Tubal ligation status: Secondary | ICD-10-CM | POA: Insufficient documentation

## 2017-04-14 DIAGNOSIS — M10071 Idiopathic gout, right ankle and foot: Secondary | ICD-10-CM | POA: Diagnosis not present

## 2017-04-14 DIAGNOSIS — Z955 Presence of coronary angioplasty implant and graft: Secondary | ICD-10-CM | POA: Diagnosis not present

## 2017-04-14 DIAGNOSIS — Z9889 Other specified postprocedural states: Secondary | ICD-10-CM | POA: Insufficient documentation

## 2017-04-14 LAB — GLUCOSE, POCT (MANUAL RESULT ENTRY): POC Glucose: 324 mg/dl — AB (ref 70–99)

## 2017-04-14 MED ORDER — AMLODIPINE BESYLATE 10 MG PO TABS: 10.0000 mg | ORAL_TABLET | Freq: Every day | ORAL | 1 refills | Status: DC

## 2017-04-14 MED ORDER — ACCU-CHEK AVIVA DEVI: 0 refills | Status: DC

## 2017-04-14 MED ORDER — INSULIN ASPART 100 UNIT/ML ~~LOC~~ SOLN: SUBCUTANEOUS | 5 refills | Status: DC

## 2017-04-14 MED ORDER — VALACYCLOVIR HCL 500 MG PO TABS: 500.0000 mg | ORAL_TABLET | Freq: Every day | ORAL | 3 refills | Status: DC

## 2017-04-14 MED ORDER — ACETAMINOPHEN-CODEINE #3 300-30 MG PO TABS: 1.0000 | ORAL_TABLET | Freq: Two times a day (BID) | ORAL | 1 refills | Status: DC | PRN

## 2017-04-14 MED ORDER — FERROUS SULFATE 325 (65 FE) MG PO TABS: 325.0000 mg | ORAL_TABLET | Freq: Two times a day (BID) | ORAL | 1 refills | Status: DC

## 2017-04-14 MED ORDER — COLCHICINE 0.6 MG PO TABS: 0.6000 mg | ORAL_TABLET | Freq: Every day | ORAL | 1 refills | Status: DC

## 2017-04-14 MED ORDER — INSULIN GLARGINE 100 UNIT/ML SOLOSTAR PEN: 40.0000 [IU] | PEN_INJECTOR | Freq: Two times a day (BID) | SUBCUTANEOUS | 2 refills | Status: DC

## 2017-04-14 MED ORDER — OMEPRAZOLE 20 MG PO CPDR: 20.0000 mg | DELAYED_RELEASE_CAPSULE | Freq: Every day | ORAL | 1 refills | Status: DC

## 2017-04-14 MED ORDER — ACCU-CHEK SOFTCLIX LANCET DEV MISC: 5 refills | Status: DC

## 2017-04-14 MED ORDER — COLCHICINE 0.6 MG PO CAPS: 0.6000 mg | ORAL_CAPSULE | Freq: Every day | ORAL | 1 refills | Status: DC

## 2017-04-14 MED ORDER — GLUCOSE BLOOD VI STRP: ORAL_STRIP | 12 refills | Status: DC

## 2017-04-14 MED FILL — LANTUS SOLOSTAR 100 UNITS/M: 100 | 30 days supply | Qty: 24 | Fill #0

## 2017-04-14 MED FILL — ACCU-CHEK AVIVA PLUS TEST S: 25 days supply | Qty: 100 | Fill #0

## 2017-04-14 MED FILL — AMLODIPINE BESYLATE 10 MG T: 10 | 30 days supply | Qty: 30 | Fill #0

## 2017-04-14 MED FILL — OMEPRAZOLE DR 20 MG CAPSULE: 20 | 30 days supply | Qty: 30 | Fill #0

## 2017-04-14 MED FILL — ACCU-CHEK AVIVA PLUS METER: W/DEVICE | 30 days supply | Qty: 1 | Fill #0

## 2017-04-14 MED FILL — ACCU-CHEK SOFTCLIX LANCETS: 25 days supply | Qty: 100 | Fill #0

## 2017-04-14 MED FILL — FERROUS SULFATE 325 MG TAB: 325 (65 FE) | 30 days supply | Qty: 60 | Fill #0

## 2017-04-14 NOTE — Progress Notes (Signed)
Subjective:  Patient ID: [name], female    DOB: Apr 01, 1956  Age: 61 y.o. MRN: [id number]  CC: Diabetes  HPI [name] is a 61 year old female with a history of uncontrolled type 2 diabetes mellitus (A1c 9.8) hypertension, CHF (2d echo from 02/2017, EF 60-65%, no regional wall motion abnormalities and grade 1 diastolic dysfunction improved from 45-50%, septal and apical hypokinesis on 09/2016), CAD (s/p staged PCI with DES to proximal LAD currently on dual antiplatelet therapy with aspirin and Brilinta), asthma, CK D stage III, anemia, GERD here for a follow-up visit.  Her diabetes is uncontrolled with A1c of 9.8; her CBG in the clinic is 324 and she endorses not taking her insulin this morning as she was in a hurry. She denies hypoglycemia or numbness in extremities. She does not exercise routinely and has not been compliant with a diabetic diet.  Her blood pressure is also elevated and she endorses compliance with her antihypertensives.  Her chronic kidney disease is currently managed by her nephrologist She was recently seen at the ED for gout flare in her right foot and placed on prednisone and Percocet which she has been taking with resulting improvement in symptoms. She currently wears a boot on her right foot.  She does have chronic pain and swelling of the joints of her hands and takes Tylenol 3 as needed for pain.  Past Medical History:  Diagnosis Date  . Aortic stenosis    Echo 8/18: mean 13, peak 28, LVOT/AV mean velocity 0.51  . Asthma    As a child   . Bronchitis   . CAD (coronary artery disease)    a. 09/2016: 50% Ost 1st Mrg stenosis, 50% 2nd Mrg stenosis, 20% Mid-Cx, 95% Prox LAD, 40% mid-LAD, and 10% dist-LAD stenosis. Staged PCI with DES to Prox-LAD.   [name] Chronic combined systolic and diastolic CHF (congestive heart failure) ([place]) 2011   echo 2/18: EF 55-60, normal wall motion, grade 2 diastolic dysfunction, trivial AI // echo 3/18: Septal and apical HK, EF  45-50, normal wall motion, trivial AI, mild LAE, PASP 38 // echo 8/18: EF 60-65, normal wall motion, grade 1 diastolic dysfunction, calcified aortic valve leaflets, mild aortic stenosis (mean 13, peak 28, LVOT/AV mean velocity 0.51), mild AI, moderate MAC, mild LAE, trivial TR   . Complication of anesthesia   . Diabetes mellitus Dx 1989  . Hepatitis C Dx 2013  . Hypertension Dx 1989  . Obesity   . Pancreatitis 2013  . Refusal of blood transfusions as patient is Jehovah's Witness   . Tendinitis   . Ulcer 2010    Past Surgical History:  Procedure Laterality Date  . CHOLECYSTECTOMY    . CORONARY STENT INTERVENTION N/A 09/18/2016   Procedure: Coronary Stent Intervention;  Surgeon: Troy Sine, MD;  Location: [facility];  Service: Cardiovascular;  Laterality: N/A;  . EYE SURGERY    . KNEE ARTHROSCOPY    . LEFT HEART CATH N/A 09/18/2016   Procedure: Left Heart Cath;  Surgeon: Troy Sine, MD;  Location: [place];  Service: Cardiovascular;  Laterality: N/A;  . LEFT HEART CATH AND CORONARY ANGIOGRAPHY N/A 09/16/2016   Procedure: Left Heart Cath and Coronary Angiography;  Surgeon: Burnell Blanks, MD;  Location: [facility];  Service: Cardiovascular;  Laterality: N/A;  . TUBAL LIGATION    . TUBAL LIGATION      Allergies  Allergen Reactions  . Shellfish Allergy Anaphylaxis and Swelling  .  Diazepam Other (See Comments)    "felt like out of body experience"  . Morphine And Related Itching     Outpatient Medications Prior to Visit  Medication Sig Dispense Refill  . albuterol (PROVENTIL) (2.5 MG/3ML) 0.083% nebulizer solution Take 3 mLs (2.5 mg total) by nebulization every 6 (six) hours as needed for wheezing or shortness of breath. 150 mL 1  . albuterol (VENTOLIN HFA) 108 (90 Base) MCG/ACT inhaler Inhale 2 puffs into the lungs every 4 (four) hours as needed for wheezing or shortness of breath. 54 g 3  . allopurinol (ZYLOPRIM) 100 MG tablet Take 1 tablet (100  mg total) by mouth daily. 30 tablet 6  . aspirin EC 81 MG EC tablet Take 1 tablet (81 mg total) by mouth daily.    [name] atorvastatin (LIPITOR) 80 MG tablet Take 1 tablet (80 mg total) by mouth daily at 6 PM. 90 tablet 3  . carvedilol (COREG) 25 MG tablet TAKE 1 TABLET BY MOUTH 2 TIMES DAILY WITH A MEAL. 180 tablet 3  . fluticasone (FLONASE) 50 MCG/ACT nasal spray Place 2 sprays into both nostrils daily as needed for allergies or rhinitis. 16 g 5  . Fluticasone-Salmeterol (ADVAIR DISKUS) 250-50 MCG/DOSE AEPB Inhale 1 puff into the lungs 2 (two) times daily. (Patient taking differently: Inhale 1 puff into the lungs 2 (two) times daily as needed (shortness of breath). ) 60 each 3  . furosemide (LASIX) 40 MG tablet Take 2 pills (80 mg) in the morning and 40 mg in the afternoon (Patient taking differently: Take 40-80 mg by mouth See admin instructions. Take 2 pills (80 mg) in the morning and 40 mg in the afternoon) 270 tablet 3  . glucose blood (TRUE METRIX BLOOD GLUCOSE TEST) test strip Use as instructed 100 each 12  . hydrALAZINE (APRESOLINE) 50 MG tablet Take 1 tablet (50 mg total) by mouth 3 (three) times daily. 270 tablet 3  . hydrOXYzine (ATARAX/VISTARIL) 50 MG tablet TAKE 1 TABLET (50 MG TOTAL) BY MOUTH 3 (THREE) TIMES DAILY AS NEEDED. (Patient taking differently: Take 50 mg by mouth 3 (three) times daily as needed for itching. ) 90 tablet 0  . isosorbide mononitrate (IMDUR) 60 MG 24 hr tablet Take 1 tablet (60 mg total) by mouth daily. 30 tablet 11  . ketotifen (ZADITOR) 0.025 % ophthalmic solution Place 1 drop into both eyes daily as needed (for dry eyes). 10 mL 2  . nitroGLYCERIN (NITROSTAT) 0.4 MG SL tablet Place 1 tablet (0.4 mg total) under the tongue every 5 (five) minutes x 3 doses as needed for chest pain. 25 tablet 12  . oxyCODONE-acetaminophen (PERCOCET/ROXICET) 5-325 MG tablet Take 1 tablet by mouth every 6 (six) hours as needed for severe pain. 15 tablet 0  . predniSONE (DELTASONE) 20 MG  tablet Take 1 tablet (20 mg total) by mouth daily. Make sure to monitor her blood sugars closely 5 tablet 0  . pregabalin (LYRICA) 100 MG capsule Take 1 capsule (100 mg total) by mouth 3 (three) times daily. 270 capsule 3  . ticagrelor (BRILINTA) 90 MG TABS tablet Take 1 tablet (90 mg total) by mouth 2 (two) times daily. 180 tablet 3  . acetaminophen-codeine (TYLENOL #3) 300-30 MG tablet Take 1 tablet by mouth every 8 (eight) hours as needed for moderate pain. 60 tablet 2  . amLODipine (NORVASC) 5 MG tablet Take 5 mg by mouth daily.  11  . colchicine 0.6 MG tablet Take 1 tablet (0.6 mg total) by mouth daily.  30 tablet 2  . ferrous sulfate 325 (65 FE) MG tablet Take 1 tablet (325 mg total) by mouth 2 (two) times daily with a meal. 60 tablet 3  . insulin aspart (NOVOLOG) 100 UNIT/ML injection Inject 25 Units into the skin 3 (three) times daily before meals. 3 vial 5  . Insulin Glargine (LANTUS SOLOSTAR) 100 UNIT/ML Solostar Pen Inject 40 Units into the skin 2 (two) times daily. 24 mL 2  . omeprazole (PRILOSEC) 20 MG capsule Take 1 capsule (20 mg total) by mouth daily. 90 capsule 3  . ranitidine (ZANTAC) 150 MG tablet Take 1 tablet (150 mg total) by mouth 2 (two) times daily. 60 tablet 3  . sitaGLIPtin (JANUVIA) 25 MG tablet Take 1 tablet (25 mg total) by mouth daily. For PASS 90 tablet 3  . valACYclovir (VALTREX) 500 MG tablet Take 1 tablet (500 mg total) by mouth daily. 90 tablet 3   No facility-administered medications prior to visit.     ROS Review of Systems  Constitutional: Negative for activity change, appetite change and fatigue.  HENT: Negative for congestion, sinus pressure and sore throat.   Eyes: Negative for visual disturbance.  Respiratory: Negative for cough, chest tightness, shortness of breath and wheezing.   Cardiovascular: Negative for chest pain and palpitations.  Gastrointestinal: Negative for abdominal distention, abdominal pain and constipation.  Endocrine: Negative for  polydipsia.  Genitourinary: Negative for dysuria and frequency.  Musculoskeletal:       See history of present illness  Skin: Negative for rash.  Neurological: Negative for tremors, light-headedness and numbness.  Hematological: Does not bruise/bleed easily.  Psychiatric/Behavioral: Negative for agitation and behavioral problems.    Objective:  BP (!) 169/64   Pulse 85   Temp 98.2 F (36.8 C) (Oral)   Ht _0  (1.727 m)   Wt (!) 300 lb 6.4 oz (136.3 kg)   SpO2 (!) 85%   BMI 45.68 kg/m   BP/Weight 04/14/2017 04/12/2017 0/35/4656  Systolic BP 812 751 -  Diastolic BP 64 54 -  Wt. (Lbs) 300.4 - 290  BMI 45.68 - 44.09      Physical Exam  Constitutional: She is oriented to person, place, and time. She appears well-developed and well-nourished.  Cardiovascular: Normal rate, normal heart sounds and intact distal pulses.   No murmur heard. Pulmonary/Chest: Effort normal and breath sounds normal. She has no wheezes. She has no rales. She exhibits no tenderness.  Abdominal: Soft. Bowel sounds are normal. She exhibits no distension and no mass. There is no tenderness.  Musculoskeletal: Normal range of motion.  Right foot in a boot No tenderness on palpation of the dorsum  Neurological: She is alert and oriented to person, place, and time.  Skin: Skin is warm and dry.  Psychiatric: She has a normal mood and affect.     Lab Results  Component Value Date   HGBA1C 9.8 02/12/2017    CMP Latest Ref Rng & Units 03/19/2017 03/18/2017 03/17/2017  Glucose 65 - 99 mg/dL 219(H) 289(H) 217(H)  BUN 6 - 20 mg/dL 88(H) 78(H) 77(H)  Creatinine 0.44 - 1.00 mg/dL 3.20(H) 2.97(H) 2.87(H)  Sodium 135 - 145 mmol/L 135 134(L) 139  Potassium 3.5 - 5.1 mmol/L 4.2 4.3 3.9  Chloride 101 - 111 mmol/L 105 104 105  CO2 22 - 32 mmol/L 21(L) 16(L) 21(L)  Calcium 8.9 - 10.3 mg/dL 8.4(L) 8.6(L) 8.9  Total Protein 6.5 - 8.1 g/dL - - 7.5  Total Bilirubin 0.3 - 1.2 mg/dL - -  0.5  Alkaline Phos 38 - 126 U/L - -  149(H)  AST 15 - 41 U/L - - 16  ALT 14 - 54 U/L - - 25    CBC    Component Value Date/Time   WBC 8.2 03/19/2017 0341   RBC 3.48 (L) 03/19/2017 0341   HGB 9.5 (L) 03/19/2017 0341   HGB 10.9 (L) 01/07/2017 0946   HCT 29.1 (L) 03/19/2017 0341   HCT 34.5 01/07/2017 0946   PLT 221 03/19/2017 0341   PLT 236 01/07/2017 0946   MCV 83.6 03/19/2017 0341   MCV 86 01/07/2017 0946   MCH 27.3 03/19/2017 0341   MCHC 32.6 03/19/2017 0341   RDW 16.4 (H) 03/19/2017 0341   RDW 15.9 (H) 01/07/2017 0946   LYMPHSABS 2.0 03/17/2017 1904   MONOABS 0.5 03/17/2017 1904   EOSABS 0.2 03/17/2017 1904   BASOSABS 0.0 03/17/2017 1904     Assessment & Plan:   1. Diabetes mellitus type 2, uncontrolled, with complications ([place]) Uncontrolled with A1c of 9.8 Discontinue Januvia Commence Victoza-explained instructions regarding uptitrating dose of Victoza Change NovoLog, fixed regimen to sliding scale We'll see back in 3 weeks to assess for improvement Diabetic diet, lifestyle modifications - POCT glucose (manual entry) - insulin aspart (NOVOLOG) 100 UNIT/ML injection; 0-12 units subcutaneously 3 times daily before meals according to sliding scale  Dispense: 3 vial; Refill: 5 - Insulin Glargine (LANTUS SOLOSTAR) 100 UNIT/ML Solostar Pen; Inject 40 Units into the skin 2 (two) times daily.  Dispense: 24 mL; Refill: 2 - Blood Glucose Monitoring Suppl (ACCU-CHEK AVIVA) device; Use as instructed daily.  Dispense: 1 each; Refill: 0 - glucose blood (ACCU-CHEK AVIVA) test strip; Use as instructed daily  Dispense: 100 each; Refill: 12  2. CKD (chronic kidney disease) stage 4, GFR 15-29 ml/min (HCC) Secondary to Hypertensive and diabetic nephropathy Last creatinine was 3.2 Currently followed by nephrology Avoid nephrotoxins  3. Chronic gout due to renal impairment of multiple sites without tophus Acute flare of right foot resolving - colchicine 0.6 MG tablet; Take 1 tablet (0.6 mg total) by mouth daily.   Dispense: 90 tablet; Refill: 1  4. Gastroesophageal reflux disease without esophagitis Stable - omeprazole (PRILOSEC) 20 MG capsule; Take 1 capsule (20 mg total) by mouth daily.  Dispense: 90 capsule; Refill: 1  5. Anemia in stage 3 chronic kidney disease (HCC) Stable - ferrous sulfate 325 (65 FE) MG tablet; Take 1 tablet (325 mg total) by mouth 2 (two) times daily with a meal.  Dispense: 180 tablet; Refill: 1  6. Herpes simplex infection of genitourinary system No acute flares - valACYclovir (VALTREX) 500 MG tablet; Take 1 tablet (500 mg total) by mouth daily.  Dispense: 90 tablet; Refill: 3  7. Acute idiopathic gout of right foot - acetaminophen-codeine (TYLENOL #3) 300-30 MG tablet; Take 1 tablet by mouth every 12 (twelve) hours as needed for severe pain. For osteoarthritis (unable to take NSAIDs due to CKD)  Dispense: 30 tablet; Refill: 1  8. Onychomycosis - Ambulatory referral to Podiatry  9. Essential hypertension Uncontrolled Increased dose of amlodipine Low-sodium diet - amLODipine (NORVASC) 10 MG tablet; Take 1 tablet (10 mg total) by mouth daily.  Dispense: 90 tablet; Refill: 1  10. Primary osteoarthritis of right hand Unable to prescribe NSAIDs due to see daily Use Tylenol 3 as needed   Meds ordered this encounter  Medications  . colchicine 0.6 MG tablet    Sig: Take 1 tablet (0.6 mg total) by mouth daily.  Dispense:  90 tablet    Refill:  1  . omeprazole (PRILOSEC) 20 MG capsule    Sig: Take 1 capsule (20 mg total) by mouth daily.    Dispense:  90 capsule    Refill:  1    Dc ranitidine  . insulin aspart (NOVOLOG) 100 UNIT/ML injection    Sig: 0-12 units subcutaneously 3 times daily before meals according to sliding scale    Dispense:  3 vial    Refill:  5  . ferrous sulfate 325 (65 FE) MG tablet    Sig: Take 1 tablet (325 mg total) by mouth 2 (two) times daily with a meal.    Dispense:  180 tablet    Refill:  1  . Insulin Glargine (LANTUS SOLOSTAR) 100  UNIT/ML Solostar Pen    Sig: Inject 40 Units into the skin 2 (two) times daily.    Dispense:  24 mL    Refill:  2  . amLODipine (NORVASC) 10 MG tablet    Sig: Take 1 tablet (10 mg total) by mouth daily.    Dispense:  90 tablet    Refill:  1  . valACYclovir (VALTREX) 500 MG tablet    Sig: Take 1 tablet (500 mg total) by mouth daily.    Dispense:  90 tablet    Refill:  3    90 day supply  . acetaminophen-codeine (TYLENOL #3) 300-30 MG tablet    Sig: Take 1 tablet by mouth every 12 (twelve) hours as needed for severe pain. For osteoarthritis (unable to take NSAIDs due to CKD)    Dispense:  30 tablet    Refill:  1  . Blood Glucose Monitoring Suppl (ACCU-CHEK AVIVA) device    Sig: Use as instructed daily.    Dispense:  1 each    Refill:  0  . Lancet Devices (ACCU-CHEK SOFTCLIX) lancets    Sig: Use as instructed daily.    Dispense:  1 each    Refill:  5  . glucose blood (ACCU-CHEK AVIVA) test strip    Sig: Use as instructed daily    Dispense:  100 each    Refill:  12    Follow-up: Return in about 3 weeks (around 05/05/2017) for Follow-up of diabetes mellitus.   Arnoldo Morale MD

## 2017-04-14 NOTE — Patient Instructions (Signed)
Low-Purine Diet Purines are compounds that affect the level of uric acid in your body. A low-purine diet is a diet that is low in purines. Eating a low-purine diet can prevent the level of uric acid in your body from getting too high and causing gout or kidney stones or both. What do I need to know about this diet?  Choose low-purine foods. Examples of low-purine foods are listed in the next section.  Drink plenty of fluids, especially water. Fluids can help remove uric acid from your body. Try to drink 8-16 cups (1.9-3.8 L) a day.  Limit foods high in fat, especially saturated fat, as fat makes it harder for the body to get rid of uric acid. Foods high in saturated fat include pizza, cheese, ice cream, whole milk, fried foods, and gravies. Choose foods that are lower in fat and lean sources of protein. Use olive oil when cooking as it contains healthy fats that are not high in saturated fat.  Limit alcohol. Alcohol interferes with the elimination of uric acid from your body. If you are having a gout attack, avoid all alcohol.  Keep in mind that different people's bodies react differently to different foods. You will probably learn over time which foods do or do not affect you. If you discover that a food tends to cause your gout to flare up, avoid eating that food. You can more freely enjoy foods that do not cause problems. If you have any questions about a food item, talk to your dietitian or health care provider. Which foods are low, moderate, and high in purines? The following is a list of foods that are low, moderate, and high in purines. You can eat any amount of the foods that are low in purines. You may be able to have small amounts of foods that are moderate in purines. Ask your health care provider how much of a food moderate in purines you can have. Avoid foods high in purines. Grains  Foods low in purines: Enriched white bread, pasta, rice, cake, cornbread, popcorn.  Foods moderate in  purines: Whole-grain breads and cereals, wheat germ, bran, oatmeal. Uncooked oatmeal. Dry wheat bran or wheat germ.  Foods high in purines: Pancakes, French toast, biscuits, muffins. Vegetables  Foods low in purines: All vegetables, except those that are moderate in purines.  Foods moderate in purines: Asparagus, cauliflower, spinach, mushrooms, green peas. Fruits  All fruits are low in purines. Meats and other Protein Foods  Foods low in purines: Eggs, nuts, peanut butter.  Foods moderate in purines: 80-90% lean beef, lamb, veal, pork, poultry, fish, eggs, peanut butter, nuts. Crab, lobster, oysters, and shrimp. Cooked dried beans, peas, and lentils.  Foods high in purines: Anchovies, sardines, herring, mussels, tuna, codfish, scallops, trout, and haddock. Bacon. Organ meats (such as liver or kidney). Tripe. Game meat. Goose. Sweetbreads. Dairy  All dairy foods are low in purines. Low-fat and fat-free dairy products are best because they are low in saturated fat. Beverages  Drinks low in purines: Water, carbonated beverages, tea, coffee, cocoa.  Drinks moderate in purines: Soft drinks and other drinks sweetened with high-fructose corn syrup. Juices. To find whether a food or drink is sweetened with high-fructose corn syrup, look at the ingredients list.  Drinks high in purines: Alcoholic beverages (such as beer). Condiments  Foods low in purines: Salt, herbs, olives, pickles, relishes, vinegar.  Foods moderate in purines: Butter, margarine, oils, mayonnaise. Fats and Oils  Foods low in purines: All types, except gravies   and sauces made with meat.  Foods high in purines: Gravies and sauces made with meat. Other Foods  Foods low in purines: Sugars, sweets, gelatin. Cake. Soups made without meat.  Foods moderate in purines: Meat-based or fish-based soups, broths, or bouillons. Foods and drinks sweetened with high-fructose corn syrup.  Foods high in purines: High-fat desserts  (such as ice cream, cookies, cakes, pies, doughnuts, and chocolate). Contact your dietitian for more information on foods that are not listed here. This information is not intended to replace advice given to you by your health care provider. Make sure you discuss any questions you have with your health care provider. Document Released: 10/24/2010 Document Revised: 12/05/2015 Document Reviewed: 06/05/2013 Elsevier Interactive Patient Education  2017 [employer]  

## 2017-04-15 ENCOUNTER — Other Ambulatory Visit: Payer: Self-pay | Admitting: Family Medicine

## 2017-04-15 ENCOUNTER — Ambulatory Visit
Admission: RE | Admit: 2017-04-15 | Discharge: 2017-04-15 | Disposition: A | Discharge status: Home / Self Care | Payer: Medicaid Other | Source: Ambulatory Visit | Attending: Family Medicine | Admitting: Family Medicine

## 2017-04-15 DIAGNOSIS — R921 Mammographic calcification found on diagnostic imaging of breast: Secondary | ICD-10-CM

## 2017-04-15 DIAGNOSIS — R928 Other abnormal and inconclusive findings on diagnostic imaging of breast: Secondary | ICD-10-CM

## 2017-04-16 MED FILL — COLCHICINE 0.6 MG TABLET: 0.6 | 30 days supply | Qty: 30 | Fill #0

## 2017-04-19 ENCOUNTER — Telehealth: Payer: Self-pay | Admitting: Family Medicine

## 2017-04-19 NOTE — Telephone Encounter (Signed)
Pt called requesting to speak to nurse regarding medication, please f/up

## 2017-04-20 NOTE — Telephone Encounter (Signed)
Pt. Returned nurse call. Please f/u

## 2017-04-20 NOTE — Telephone Encounter (Signed)
Pt was called and a VM was left informing pt to return phone call to discuss medication.

## 2017-04-21 MED FILL — hydrALAZINE HCL 50 MG TABS: 50 | 30 days supply | Qty: 90 | Fill #1

## 2017-04-22 ENCOUNTER — Encounter: Payer: Self-pay | Admitting: Pharmacist

## 2017-04-22 ENCOUNTER — Telehealth: Payer: Self-pay | Admitting: Family Medicine

## 2017-04-22 ENCOUNTER — Encounter: Payer: Self-pay | Admitting: Physician Assistant

## 2017-04-22 NOTE — Progress Notes (Signed)
Prior authorization received from [facility] for Tylenol #3. Completed and approved x 180 days. Approval #[id number]  Of note, after 180 days, [place] [employer] requires submission of justification and progress notes for continued use of Tylenol #3.

## 2017-04-22 NOTE — Telephone Encounter (Signed)
Pt called back returning your call please call her [phone]

## 2017-04-23 ENCOUNTER — Inpatient Hospital Stay: Payer: Medicaid Other

## 2017-04-26 ENCOUNTER — Other Ambulatory Visit: Payer: Self-pay | Admitting: Family Medicine

## 2017-04-26 ENCOUNTER — Encounter: Payer: Self-pay | Admitting: Pharmacist

## 2017-04-26 DIAGNOSIS — R921 Mammographic calcification found on diagnostic imaging of breast: Secondary | ICD-10-CM

## 2017-04-26 MED ORDER — LIRAGLUTIDE 18 MG/3ML ~~LOC~~ SOPN: 1.8000 mg | PEN_INJECTOR | Freq: Every day | SUBCUTANEOUS | 3 refills | Status: DC

## 2017-04-26 NOTE — Progress Notes (Signed)
Received prior authorization request from pharmacy for Victoza through [facility]. Submitted request for non-preferred GLP-1 for approval as patient has CAD and Victoza has CV risk reduction data unlike preferred agents. Pending review by [place] [employer].

## 2017-04-27 ENCOUNTER — Encounter (HOSPITAL_COMMUNITY): Payer: Self-pay

## 2017-04-27 DIAGNOSIS — Z79899 Other long term (current) drug therapy: Secondary | ICD-10-CM

## 2017-04-27 DIAGNOSIS — N179 Acute kidney failure, unspecified: Secondary | ICD-10-CM | POA: Diagnosis not present

## 2017-04-27 DIAGNOSIS — Z6841 Body Mass Index (BMI) 40.0 and over, adult: Secondary | ICD-10-CM

## 2017-04-27 DIAGNOSIS — Z955 Presence of coronary angioplasty implant and graft: Secondary | ICD-10-CM

## 2017-04-27 DIAGNOSIS — Z87891 Personal history of nicotine dependence: Secondary | ICD-10-CM

## 2017-04-27 DIAGNOSIS — A6000 Herpesviral infection of urogenital system, unspecified: Secondary | ICD-10-CM | POA: Diagnosis present

## 2017-04-27 DIAGNOSIS — Z8249 Family history of ischemic heart disease and other diseases of the circulatory system: Secondary | ICD-10-CM

## 2017-04-27 DIAGNOSIS — T380X5A Adverse effect of glucocorticoids and synthetic analogues, initial encounter: Secondary | ICD-10-CM | POA: Diagnosis not present

## 2017-04-27 DIAGNOSIS — E1165 Type 2 diabetes mellitus with hyperglycemia: Secondary | ICD-10-CM | POA: Diagnosis present

## 2017-04-27 DIAGNOSIS — K219 Gastro-esophageal reflux disease without esophagitis: Secondary | ICD-10-CM | POA: Diagnosis present

## 2017-04-27 DIAGNOSIS — I5042 Chronic combined systolic (congestive) and diastolic (congestive) heart failure: Secondary | ICD-10-CM | POA: Diagnosis present

## 2017-04-27 DIAGNOSIS — I2510 Atherosclerotic heart disease of native coronary artery without angina pectoris: Secondary | ICD-10-CM | POA: Diagnosis present

## 2017-04-27 DIAGNOSIS — I130 Hypertensive heart and chronic kidney disease with heart failure and stage 1 through stage 4 chronic kidney disease, or unspecified chronic kidney disease: Secondary | ICD-10-CM | POA: Diagnosis present

## 2017-04-27 DIAGNOSIS — Z91013 Allergy to seafood: Secondary | ICD-10-CM

## 2017-04-27 DIAGNOSIS — M1030 Gout due to renal impairment, unspecified site: Secondary | ICD-10-CM | POA: Diagnosis present

## 2017-04-27 DIAGNOSIS — Z9049 Acquired absence of other specified parts of digestive tract: Secondary | ICD-10-CM

## 2017-04-27 DIAGNOSIS — D509 Iron deficiency anemia, unspecified: Secondary | ICD-10-CM | POA: Diagnosis present

## 2017-04-27 DIAGNOSIS — Z794 Long term (current) use of insulin: Secondary | ICD-10-CM

## 2017-04-27 DIAGNOSIS — Z888 Allergy status to other drugs, medicaments and biological substances status: Secondary | ICD-10-CM

## 2017-04-27 DIAGNOSIS — D631 Anemia in chronic kidney disease: Secondary | ICD-10-CM | POA: Diagnosis present

## 2017-04-27 DIAGNOSIS — B182 Chronic viral hepatitis C: Secondary | ICD-10-CM | POA: Diagnosis present

## 2017-04-27 DIAGNOSIS — G4733 Obstructive sleep apnea (adult) (pediatric): Secondary | ICD-10-CM | POA: Diagnosis present

## 2017-04-27 DIAGNOSIS — Z7982 Long term (current) use of aspirin: Secondary | ICD-10-CM

## 2017-04-27 DIAGNOSIS — N184 Chronic kidney disease, stage 4 (severe): Secondary | ICD-10-CM | POA: Diagnosis present

## 2017-04-27 DIAGNOSIS — I350 Nonrheumatic aortic (valve) stenosis: Secondary | ICD-10-CM | POA: Diagnosis present

## 2017-04-27 DIAGNOSIS — Z91041 Radiographic dye allergy status: Secondary | ICD-10-CM

## 2017-04-27 DIAGNOSIS — I252 Old myocardial infarction: Secondary | ICD-10-CM

## 2017-04-27 DIAGNOSIS — I25110 Atherosclerotic heart disease of native coronary artery with unstable angina pectoris: Principal | ICD-10-CM | POA: Diagnosis present

## 2017-04-27 DIAGNOSIS — Z531 Procedure and treatment not carried out because of patient's decision for reasons of belief and group pressure: Secondary | ICD-10-CM | POA: Diagnosis present

## 2017-04-27 DIAGNOSIS — Z7902 Long term (current) use of antithrombotics/antiplatelets: Secondary | ICD-10-CM

## 2017-04-27 DIAGNOSIS — Z885 Allergy status to narcotic agent status: Secondary | ICD-10-CM

## 2017-04-27 DIAGNOSIS — E785 Hyperlipidemia, unspecified: Secondary | ICD-10-CM | POA: Diagnosis present

## 2017-04-27 NOTE — ED Triage Notes (Signed)
Pt states she has hx of CHF, DM, and HTN;pt states today she started having SOB with exertion and left side chest pain started tonight 10/10; pt a&ox 4 on arrival and able to speak in full and complete sentences;-Monique,RN

## 2017-04-28 ENCOUNTER — Emergency Department (HOSPITAL_COMMUNITY): Payer: Medicaid Other

## 2017-04-28 ENCOUNTER — Inpatient Hospital Stay (HOSPITAL_COMMUNITY)
Admission: EM | Admit: 2017-04-28 | Discharge: 2017-05-02 | DRG: 287 | Disposition: A | Discharge status: Home / Self Care | Payer: Medicaid Other | Attending: Internal Medicine | Admitting: Internal Medicine

## 2017-04-28 ENCOUNTER — Encounter (HOSPITAL_COMMUNITY): Payer: Self-pay | Admitting: Internal Medicine

## 2017-04-28 ENCOUNTER — Other Ambulatory Visit: Payer: Self-pay

## 2017-04-28 DIAGNOSIS — E1165 Type 2 diabetes mellitus with hyperglycemia: Secondary | ICD-10-CM | POA: Diagnosis present

## 2017-04-28 DIAGNOSIS — I200 Unstable angina: Secondary | ICD-10-CM | POA: Diagnosis not present

## 2017-04-28 DIAGNOSIS — I5043 Acute on chronic combined systolic (congestive) and diastolic (congestive) heart failure: Secondary | ICD-10-CM | POA: Diagnosis not present

## 2017-04-28 DIAGNOSIS — E118 Type 2 diabetes mellitus with unspecified complications: Secondary | ICD-10-CM

## 2017-04-28 DIAGNOSIS — N184 Chronic kidney disease, stage 4 (severe): Secondary | ICD-10-CM | POA: Diagnosis not present

## 2017-04-28 DIAGNOSIS — N183 Chronic kidney disease, stage 3 unspecified: Secondary | ICD-10-CM | POA: Diagnosis present

## 2017-04-28 DIAGNOSIS — E669 Obesity, unspecified: Secondary | ICD-10-CM

## 2017-04-28 DIAGNOSIS — I5042 Chronic combined systolic (congestive) and diastolic (congestive) heart failure: Secondary | ICD-10-CM | POA: Diagnosis present

## 2017-04-28 DIAGNOSIS — I252 Old myocardial infarction: Secondary | ICD-10-CM | POA: Diagnosis not present

## 2017-04-28 DIAGNOSIS — E1100 Type 2 diabetes mellitus with hyperosmolarity without nonketotic hyperglycemic-hyperosmolar coma (NKHHC): Secondary | ICD-10-CM | POA: Diagnosis present

## 2017-04-28 DIAGNOSIS — D631 Anemia in chronic kidney disease: Secondary | ICD-10-CM | POA: Diagnosis not present

## 2017-04-28 DIAGNOSIS — I10 Essential (primary) hypertension: Secondary | ICD-10-CM | POA: Diagnosis present

## 2017-04-28 DIAGNOSIS — M1A39X0 Chronic gout due to renal impairment, multiple sites, without tophus (tophi): Secondary | ICD-10-CM | POA: Diagnosis present

## 2017-04-28 DIAGNOSIS — G4733 Obstructive sleep apnea (adult) (pediatric): Secondary | ICD-10-CM | POA: Diagnosis present

## 2017-04-28 DIAGNOSIS — E1122 Type 2 diabetes mellitus with diabetic chronic kidney disease: Secondary | ICD-10-CM | POA: Diagnosis present

## 2017-04-28 DIAGNOSIS — B182 Chronic viral hepatitis C: Secondary | ICD-10-CM | POA: Diagnosis present

## 2017-04-28 DIAGNOSIS — R079 Chest pain, unspecified: Secondary | ICD-10-CM | POA: Diagnosis not present

## 2017-04-28 DIAGNOSIS — R0600 Dyspnea, unspecified: Secondary | ICD-10-CM

## 2017-04-28 DIAGNOSIS — I5033 Acute on chronic diastolic (congestive) heart failure: Secondary | ICD-10-CM | POA: Diagnosis present

## 2017-04-28 DIAGNOSIS — R072 Precordial pain: Secondary | ICD-10-CM

## 2017-04-28 LAB — BASIC METABOLIC PANEL
Anion gap: 11 (ref 5–15)
BUN: 69 mg/dL — ABNORMAL HIGH (ref 6–20)
CO2: 26 mmol/L (ref 22–32)
Calcium: 8.7 mg/dL — ABNORMAL LOW (ref 8.9–10.3)
Chloride: 99 mmol/L — ABNORMAL LOW (ref 101–111)
Creatinine, Ser: 2.19 mg/dL — ABNORMAL HIGH (ref 0.44–1.00)
GFR calc Af Amer: 27 mL/min — ABNORMAL LOW (ref >60)
GFR calc non Af Amer: 23 mL/min — ABNORMAL LOW (ref >60)
Glucose, Bld: 198 mg/dL — ABNORMAL HIGH (ref 65–99)
Potassium: 3.7 mmol/L (ref 3.5–5.1)
Sodium: 136 mmol/L (ref 135–145)

## 2017-04-28 LAB — CBC
HCT: 29.7 % — ABNORMAL LOW (ref 36.0–46.0)
HCT: 30.6 % — ABNORMAL LOW (ref 36.0–46.0)
Hemoglobin: 10 g/dL — ABNORMAL LOW (ref 12.0–15.0)
Hemoglobin: 9.6 g/dL — ABNORMAL LOW (ref 12.0–15.0)
MCH: 27.9 pg (ref 26.0–34.0)
MCH: 28 pg (ref 26.0–34.0)
MCHC: 32.3 g/dL (ref 30.0–36.0)
MCHC: 32.7 g/dL (ref 30.0–36.0)
MCV: 85.7 fL (ref 78.0–100.0)
MCV: 86.3 fL (ref 78.0–100.0)
Platelets: 202 10*3/uL (ref 150–400)
Platelets: 227 10*3/uL (ref 150–400)
RBC: 3.44 MIL/uL — ABNORMAL LOW (ref 3.87–5.11)
RBC: 3.57 MIL/uL — ABNORMAL LOW (ref 3.87–5.11)
RDW: 17.2 % — ABNORMAL HIGH (ref 11.5–15.5)
RDW: 17.5 % — ABNORMAL HIGH (ref 11.5–15.5)
WBC: 6 10*3/uL (ref 4.0–10.5)
WBC: 6.5 10*3/uL (ref 4.0–10.5)

## 2017-04-28 LAB — GLUCOSE, CAPILLARY
Glucose-Capillary: 194 mg/dL — ABNORMAL HIGH (ref 65–99)
Glucose-Capillary: 200 mg/dL — ABNORMAL HIGH (ref 65–99)
Glucose-Capillary: 193 mg/dL — ABNORMAL HIGH (ref 65–99)
Glucose-Capillary: 352 mg/dL — ABNORMAL HIGH (ref 65–99)

## 2017-04-28 LAB — BRAIN NATRIURETIC PEPTIDE: B Natriuretic Peptide: 38.6 pg/mL (ref 0.0–100.0)

## 2017-04-28 LAB — CREATININE, SERUM
Creatinine, Ser: 2.14 mg/dL — ABNORMAL HIGH (ref 0.44–1.00)
GFR calc Af Amer: 28 mL/min — ABNORMAL LOW (ref >60)
GFR calc non Af Amer: 24 mL/min — ABNORMAL LOW (ref >60)

## 2017-04-28 LAB — TROPONIN I
Troponin I: <0.03 ng/mL (ref <0.03)
Troponin I: <0.03 ng/mL (ref <0.03)
Troponin I: <0.03 ng/mL (ref <0.03)

## 2017-04-28 LAB — I-STAT TROPONIN, ED: Troponin i, poc: 0.01 ng/mL (ref 0.00–0.08)

## 2017-04-28 LAB — I-STAT BETA HCG BLOOD, ED (MC, WL, AP ONLY): I-stat hCG, quantitative: <5 m[IU]/mL (ref <5)

## 2017-04-28 MED ORDER — INSULIN ASPART 100 UNIT/ML ~~LOC~~ SOLN
0.0000 [IU] | Freq: Three times a day (TID) | SUBCUTANEOUS | Status: DC
  Administered 2017-04-28 (×2): 2 [IU] via SUBCUTANEOUS
  Administered 2017-04-29 (×2): 9 [IU] via SUBCUTANEOUS

## 2017-04-28 MED ORDER — FUROSEMIDE 80 MG PO TABS: 80.0000 mg | ORAL_TABLET | Freq: Every morning | ORAL | Status: DC

## 2017-04-28 MED ORDER — ACETAMINOPHEN 325 MG PO TABS
650.0000 mg | ORAL_TABLET | ORAL | Status: DC | PRN
  Filled 2017-04-28: qty 2

## 2017-04-28 MED ORDER — PREDNISONE 50 MG PO TABS
50.0000 mg | ORAL_TABLET | Freq: Four times a day (QID) | ORAL | Status: AC
Start: 2017-04-28 — End: 2017-04-29
  Administered 2017-04-29 (×3): 50 mg via ORAL
  Filled 2017-04-28 (×3): qty 1

## 2017-04-28 MED ORDER — KETOTIFEN FUMARATE 0.025 % OP SOLN: 1.0000 [drp] | Freq: Every day | OPHTHALMIC | Status: DC | PRN

## 2017-04-28 MED ORDER — COLCHICINE 0.6 MG PO TABS
0.6000 mg | ORAL_TABLET | Freq: Every day | ORAL | Status: DC
  Administered 2017-04-28 – 2017-05-02 (×5): 0.6 mg via ORAL
  Filled 2017-04-28 (×5): qty 1

## 2017-04-28 MED ORDER — DIPHENHYDRAMINE HCL 50 MG/ML IJ SOLN
50.0000 mg | Freq: Once | INTRAMUSCULAR | Status: AC
  Administered 2017-04-29: 50 mg via INTRAVENOUS
  Filled 2017-04-28: qty 1

## 2017-04-28 MED ORDER — SODIUM CHLORIDE 0.9 % IV SOLN: INTRAVENOUS | Status: DC

## 2017-04-28 MED ORDER — NITROGLYCERIN IN D5W 200-5 MCG/ML-% IV SOLN
25.0000 ug/min | INTRAVENOUS | Status: DC
  Administered 2017-04-28: 25 ug/min via INTRAVENOUS
  Filled 2017-04-28: qty 250

## 2017-04-28 MED ORDER — NITROGLYCERIN 0.4 MG SL SUBL
0.4000 mg | SUBLINGUAL_TABLET | SUBLINGUAL | Status: DC | PRN
  Administered 2017-04-28 (×2): 0.4 mg via SUBLINGUAL
  Filled 2017-04-28: qty 1

## 2017-04-28 MED ORDER — FERROUS SULFATE 325 (65 FE) MG PO TABS
325.0000 mg | ORAL_TABLET | Freq: Two times a day (BID) | ORAL | Status: DC
  Administered 2017-04-28 – 2017-05-02 (×7): 325 mg via ORAL
  Filled 2017-04-28 (×7): qty 1

## 2017-04-28 MED ORDER — ALBUTEROL SULFATE (2.5 MG/3ML) 0.083% IN NEBU: 2.5000 mg | INHALATION_SOLUTION | RESPIRATORY_TRACT | Status: DC | PRN

## 2017-04-28 MED ORDER — ALLOPURINOL 100 MG PO TABS
100.0000 mg | ORAL_TABLET | Freq: Every day | ORAL | Status: DC
  Administered 2017-04-28 – 2017-05-02 (×5): 100 mg via ORAL
  Filled 2017-04-28 (×5): qty 1

## 2017-04-28 MED ORDER — SODIUM CHLORIDE 0.9% FLUSH
3.0000 mL | Freq: Two times a day (BID) | INTRAVENOUS | Status: DC
  Administered 2017-04-28 – 2017-04-29 (×2): 3 mL via INTRAVENOUS

## 2017-04-28 MED ORDER — ASPIRIN EC 81 MG PO TBEC
81.0000 mg | DELAYED_RELEASE_TABLET | Freq: Every day | ORAL | Status: DC
  Administered 2017-04-28 – 2017-05-02 (×5): 81 mg via ORAL
  Filled 2017-04-28 (×5): qty 1

## 2017-04-28 MED ORDER — FUROSEMIDE 40 MG PO TABS: 40.0000 mg | ORAL_TABLET | Freq: Every evening | ORAL | Status: DC

## 2017-04-28 MED ORDER — DIPHENHYDRAMINE HCL 25 MG PO CAPS: 50.0000 mg | ORAL_CAPSULE | Freq: Once | ORAL | Status: AC

## 2017-04-28 MED ORDER — CARVEDILOL 25 MG PO TABS
25.0000 mg | ORAL_TABLET | Freq: Two times a day (BID) | ORAL | Status: DC
  Administered 2017-04-28 – 2017-05-02 (×9): 25 mg via ORAL
  Filled 2017-04-28 (×9): qty 1

## 2017-04-28 MED ORDER — HEPARIN SODIUM (PORCINE) 5000 UNIT/ML IJ SOLN
5000.0000 [IU] | Freq: Three times a day (TID) | INTRAMUSCULAR | Status: DC
  Administered 2017-04-28: 5000 [IU] via SUBCUTANEOUS
  Filled 2017-04-28 (×2): qty 1

## 2017-04-28 MED ORDER — MOMETASONE FURO-FORMOTEROL FUM 200-5 MCG/ACT IN AERO
2.0000 | INHALATION_SPRAY | Freq: Two times a day (BID) | RESPIRATORY_TRACT | Status: DC
  Administered 2017-04-28 – 2017-05-02 (×8): 2 via RESPIRATORY_TRACT
  Filled 2017-04-28 (×2): qty 8.8

## 2017-04-28 MED ORDER — INSULIN GLARGINE 100 UNIT/ML ~~LOC~~ SOLN
20.0000 [IU] | Freq: Two times a day (BID) | SUBCUTANEOUS | Status: DC
  Administered 2017-04-28 – 2017-04-29 (×3): 20 [IU] via SUBCUTANEOUS
  Filled 2017-04-28 (×5): qty 0.2

## 2017-04-28 MED ORDER — ONDANSETRON HCL 4 MG/2ML IJ SOLN: 4.0000 mg | Freq: Four times a day (QID) | INTRAMUSCULAR | Status: DC | PRN

## 2017-04-28 MED ORDER — FLUTICASONE PROPIONATE 50 MCG/ACT NA SUSP
2.0000 | Freq: Every day | NASAL | Status: DC | PRN
  Administered 2017-05-02: 2 via NASAL
  Filled 2017-04-28: qty 16

## 2017-04-28 MED ORDER — ALBUTEROL SULFATE (2.5 MG/3ML) 0.083% IN NEBU: 2.5000 mg | INHALATION_SOLUTION | Freq: Four times a day (QID) | RESPIRATORY_TRACT | Status: DC | PRN

## 2017-04-28 MED ORDER — ISOSORBIDE MONONITRATE ER 60 MG PO TB24
60.0000 mg | ORAL_TABLET | Freq: Every day | ORAL | Status: DC
Start: 2017-04-28 — End: 2017-04-28
  Administered 2017-04-28: 60 mg via ORAL
  Filled 2017-04-28: qty 1

## 2017-04-28 MED ORDER — HYDROXYZINE HCL 25 MG PO TABS: 50.0000 mg | ORAL_TABLET | Freq: Three times a day (TID) | ORAL | Status: DC | PRN

## 2017-04-28 MED ORDER — PANTOPRAZOLE SODIUM 40 MG PO TBEC
40.0000 mg | DELAYED_RELEASE_TABLET | Freq: Every day | ORAL | Status: DC
  Administered 2017-04-28 – 2017-05-02 (×5): 40 mg via ORAL
  Filled 2017-04-28 (×5): qty 1

## 2017-04-28 MED ORDER — ATORVASTATIN CALCIUM 80 MG PO TABS
80.0000 mg | ORAL_TABLET | Freq: Every day | ORAL | Status: DC
  Administered 2017-04-28 – 2017-05-01 (×4): 80 mg via ORAL
  Filled 2017-04-28 (×4): qty 1

## 2017-04-28 MED ORDER — SODIUM CHLORIDE 0.9 % IV SOLN
INTRAVENOUS | Status: DC
  Administered 2017-04-28 – 2017-04-29 (×2): via INTRAVENOUS

## 2017-04-28 MED ORDER — FUROSEMIDE 20 MG PO TABS
80.0000 mg | ORAL_TABLET | Freq: Every morning | ORAL | Status: DC
  Administered 2017-04-28: 80 mg via ORAL
  Filled 2017-04-28: qty 4

## 2017-04-28 MED ORDER — ASPIRIN 81 MG PO CHEW
324.0000 mg | CHEWABLE_TABLET | Freq: Once | ORAL | Status: AC
  Administered 2017-04-28: 324 mg via ORAL
  Filled 2017-04-28: qty 4

## 2017-04-28 MED ORDER — ASPIRIN EC 325 MG PO TBEC: 325.0000 mg | DELAYED_RELEASE_TABLET | Freq: Every day | ORAL | Status: DC

## 2017-04-28 MED ORDER — VALACYCLOVIR HCL 500 MG PO TABS
500.0000 mg | ORAL_TABLET | Freq: Every day | ORAL | Status: DC
  Administered 2017-04-28 – 2017-05-02 (×5): 500 mg via ORAL
  Filled 2017-04-28 (×5): qty 1

## 2017-04-28 MED ORDER — AMLODIPINE BESYLATE 10 MG PO TABS
10.0000 mg | ORAL_TABLET | Freq: Every day | ORAL | Status: DC
  Administered 2017-04-28 – 2017-05-02 (×5): 10 mg via ORAL
  Filled 2017-04-28 (×5): qty 1

## 2017-04-28 MED ORDER — HYDRALAZINE HCL 50 MG PO TABS
50.0000 mg | ORAL_TABLET | Freq: Three times a day (TID) | ORAL | Status: DC
  Administered 2017-04-28 – 2017-05-02 (×13): 50 mg via ORAL
  Filled 2017-04-28 (×13): qty 1

## 2017-04-28 MED ORDER — TICAGRELOR 90 MG PO TABS
90.0000 mg | ORAL_TABLET | Freq: Two times a day (BID) | ORAL | Status: DC
  Administered 2017-04-28 – 2017-05-02 (×9): 90 mg via ORAL
  Filled 2017-04-28 (×9): qty 1

## 2017-04-28 MED ORDER — PREGABALIN 100 MG PO CAPS
100.0000 mg | ORAL_CAPSULE | Freq: Three times a day (TID) | ORAL | Status: DC
  Administered 2017-04-28 – 2017-05-02 (×13): 100 mg via ORAL
  Filled 2017-04-28 (×13): qty 1

## 2017-04-28 MED ORDER — NITROGLYCERIN 2 % TD OINT
0.5000 [in_us] | TOPICAL_OINTMENT | Freq: Four times a day (QID) | TRANSDERMAL | Status: DC
  Administered 2017-04-28: 0.5 [in_us] via TOPICAL
  Filled 2017-04-28: qty 30

## 2017-04-28 MED ORDER — FUROSEMIDE 40 MG PO TABS
40.0000 mg | ORAL_TABLET | Freq: Every evening | ORAL | Status: DC
  Administered 2017-04-28: 40 mg via ORAL
  Filled 2017-04-28: qty 1

## 2017-04-28 NOTE — Progress Notes (Signed)
Patient admitted from ED with complaint of CP last night. Per ED report, pt has been CP free. Currently patient states that CP has been intermittent. Pt declined to take nitroglycerine because of headache. MD text paged.

## 2017-04-28 NOTE — H&P (Signed)
History and Physical    TINEA NOBILE BLT:[id number] DOB: Dec 08, 1955 DOA: 04/28/2017  PCP: Arnoldo Morale, MD  Patient coming from: home.  Chief Complaint: chest pain and shortness of breath.  HPI: [name] is a 61 y.o. femalewith history of CAD status post stenting in March 2018, CHF, chronic kidney disease stage IV, diabetes mellitus type 2, hepatitis C presents to the ER because of increasing exertional shortness of breath and fatigue since morning with chest pain. Patient's chest pain started last night around 10 PM. Pain is retrosternal with mild radiation to the left arm. Shortness of breath was present since yesterday morning. Denies any fever chills or cough. Patient states she has been compliant with her medications. Prior to coming to the ER patient tried sublingual nitroglycerin but pain did not improve. ED Course: in the ER patient was given sublingual nitro chest pain resolved. Chest x-Hemrick was unremarkable. BNP troponin was negative. EKG initially was showing T-wave inversions in the inferior leads which improved after patient's chest pain subsided following nitroglycerin. On exam patient has bilateral lower extremity edema.  Review of Systems: As per HPI, rest all negative.   Past Medical History:  Diagnosis Date  . Aortic stenosis    Echo 8/18: mean 13, peak 28, LVOT/AV mean velocity 0.51  . Asthma    As a child   . Bronchitis   . CAD (coronary artery disease)    a. 09/2016: 50% Ost 1st Mrg stenosis, 50% 2nd Mrg stenosis, 20% Mid-Cx, 95% Prox LAD, 40% mid-LAD, and 10% dist-LAD stenosis. Staged PCI with DES to Prox-LAD.   [name] Chronic combined systolic and diastolic CHF (congestive heart failure) ([place]) 2011   echo 2/18: EF 55-60, normal wall motion, grade 2 diastolic dysfunction, trivial AI // echo 3/18: Septal and apical HK, EF 45-50, normal wall motion, trivial AI, mild LAE, PASP 38 // echo 8/18: EF 60-65, normal wall motion, grade 1 diastolic dysfunction, calcified  aortic valve leaflets, mild aortic stenosis (mean 13, peak 28, LVOT/AV mean velocity 0.51), mild AI, moderate MAC, mild LAE, trivial TR   . Complication of anesthesia   . Diabetes mellitus Dx 1989  . Hepatitis C Dx 2013  . Hypertension Dx 1989  . Obesity   . Pancreatitis 2013  . Refusal of blood transfusions as patient is Jehovah's Witness   . Tendinitis   . Ulcer 2010    Past Surgical History:  Procedure Laterality Date  . CHOLECYSTECTOMY    . CORONARY STENT INTERVENTION N/A 09/18/2016   Procedure: Coronary Stent Intervention;  Surgeon: Troy Sine, MD;  Location: [facility];  Service: Cardiovascular;  Laterality: N/A;  . EYE SURGERY    . KNEE ARTHROSCOPY    . LEFT HEART CATH N/A 09/18/2016   Procedure: Left Heart Cath;  Surgeon: Troy Sine, MD;  Location: [facility];  Service: Cardiovascular;  Laterality: N/A;  . LEFT HEART CATH AND CORONARY ANGIOGRAPHY N/A 09/16/2016   Procedure: Left Heart Cath and Coronary Angiography;  Surgeon: Burnell Blanks, MD;  Location: [facility];  Service: Cardiovascular;  Laterality: N/A;  . TUBAL LIGATION    . TUBAL LIGATION       reports that she quit smoking about 36 years ago. She has never used smokeless tobacco. She reports that she drinks alcohol. She reports that she does not use drugs.  Allergies  Allergen Reactions  . Shellfish Allergy Anaphylaxis and Swelling  . Diazepam Other (See Comments)    "  felt like out of body experience"  . Morphine And Related Itching    Family History  Problem Relation Age of Onset  . Colon cancer Mother   . Heart attack Other   . Heart attack Maternal Grandmother   . Hypertension Sister   . Hypertension Brother   . Diabetes Paternal Grandmother   . Breast cancer Neg Hx     Prior to Admission medications   Medication Sig Start Date End Date Taking? Authorizing Provider  acetaminophen-codeine (TYLENOL #3) 300-30 MG tablet Take 1 tablet by mouth every 12 (twelve) hours  as needed for severe pain. For osteoarthritis (unable to take NSAIDs due to CKD) 04/14/17  Yes Amao, Charlane Ferretti, MD  albuterol (PROVENTIL) (2.5 MG/3ML) 0.083% nebulizer solution Take 3 mLs (2.5 mg total) by nebulization every 6 (six) hours as needed for wheezing or shortness of breath. 12/17/16  Yes Argentina Donovan, PA-C  albuterol (VENTOLIN HFA) 108 (90 Base) MCG/ACT inhaler Inhale 2 puffs into the lungs every 4 (four) hours as needed for wheezing or shortness of breath. 08/03/16  Yes Funches, Josalyn, MD  allopurinol (ZYLOPRIM) 100 MG tablet Take 1 tablet (100 mg total) by mouth daily. 02/12/17  Yes Funches, Josalyn, MD  amLODipine (NORVASC) 10 MG tablet Take 1 tablet (10 mg total) by mouth daily. 04/14/17  Yes Arnoldo Morale, MD  aspirin EC 81 MG EC tablet Take 1 tablet (81 mg total) by mouth daily. 09/19/16  Yes Strader, [place] M, PA-C  atorvastatin (LIPITOR) 80 MG tablet Take 1 tablet (80 mg total) by mouth daily at 6 PM. 02/12/17  Yes Funches, Josalyn, MD  Blood Glucose Monitoring Suppl (ACCU-CHEK AVIVA) device Use as instructed daily. 04/14/17  Yes Arnoldo Morale, MD  carvedilol (COREG) 25 MG tablet TAKE 1 TABLET BY MOUTH 2 TIMES DAILY WITH A MEAL. 02/12/17  Yes Funches, Josalyn, MD  Colchicine 0.6 MG CAPS Take 0.6 mg by mouth daily. 04/14/17  Yes Arnoldo Morale, MD  ferrous sulfate 325 (65 FE) MG tablet Take 1 tablet (325 mg total) by mouth 2 (two) times daily with a meal. 04/14/17  Yes Amao, Enobong, MD  fluticasone (FLONASE) 50 MCG/ACT nasal spray Place 2 sprays into both nostrils daily as needed for allergies or rhinitis. 01/07/17  Yes Funches, Josalyn, MD  Fluticasone-Salmeterol (ADVAIR DISKUS) 250-50 MCG/DOSE AEPB Inhale 1 puff into the lungs 2 (two) times daily. Patient taking differently: Inhale 1 puff into the lungs 2 (two) times daily as needed (shortness of breath).  12/17/16  Yes McClung, Angela M, PA-C  furosemide (LASIX) 40 MG tablet Take 2 pills (80 mg) in the morning and 40 mg in the  afternoon Patient taking differently: Take 40-80 mg by mouth See admin instructions. Take 2 pills (80 mg) in the morning and 40 mg in the afternoon 02/17/17  Yes Sherren Mocha, MD  glucose blood (ACCU-CHEK AVIVA) test strip Use as instructed daily 04/14/17  Yes Arnoldo Morale, MD  glucose blood (TRUE METRIX BLOOD GLUCOSE TEST) test strip Use as instructed 03/19/17  Yes Barrett, Evelene Croon, PA-C  hydrALAZINE (APRESOLINE) 50 MG tablet Take 1 tablet (50 mg total) by mouth 3 (three) times daily. 02/12/17  Yes Funches, Josalyn, MD  hydrOXYzine (ATARAX/VISTARIL) 50 MG tablet TAKE 1 TABLET (50 MG TOTAL) BY MOUTH 3 (THREE) TIMES DAILY AS NEEDED. Patient taking differently: Take 50 mg by mouth 3 (three) times daily as needed for itching.  03/02/17  Yes Jegede, Olugbemiga E, MD  insulin aspart (NOVOLOG) 100 UNIT/ML injection 0-12 units subcutaneously  3 times daily before meals according to sliding scale 04/14/17  Yes Amao, Charlane Ferretti, MD  Insulin Glargine (LANTUS SOLOSTAR) 100 UNIT/ML Solostar Pen Inject 40 Units into the skin 2 (two) times daily. Patient taking differently: Inject 30 Units into the skin 2 (two) times daily.  04/14/17  Yes Arnoldo Morale, MD  isosorbide mononitrate (IMDUR) 60 MG 24 hr tablet Take 1 tablet (60 mg total) by mouth daily. 03/19/17  Yes Barrett, Evelene Croon, PA-C  ketotifen (ZADITOR) 0.025 % ophthalmic solution Place 1 drop into both eyes daily as needed (for dry eyes). 07/27/16  Yes Boykin Nearing, MD  Lancet Devices [facility] Xxx) lancets Use as instructed daily. 04/14/17  Yes Arnoldo Morale, MD  nitroGLYCERIN (NITROSTAT) 0.4 MG SL tablet Place 1 tablet (0.4 mg total) under the tongue every 5 (five) minutes x 3 doses as needed for chest pain. 03/19/17  Yes Barrett, Evelene Croon, PA-C  omeprazole (PRILOSEC) 20 MG capsule Take 1 capsule (20 mg total) by mouth daily. 04/14/17  Yes Arnoldo Morale, MD  oxyCODONE-acetaminophen (PERCOCET/ROXICET) 5-325 MG tablet Take 1 tablet by mouth every 6 (six) hours as  needed for severe pain. 04/11/17  Yes Lawyer, Harrell Gave, PA-C  pregabalin (LYRICA) 100 MG capsule Take 1 capsule (100 mg total) by mouth 3 (three) times daily. 03/12/17  Yes Tresa Garter, MD  ticagrelor (BRILINTA) 90 MG TABS tablet Take 1 tablet (90 mg total) by mouth 2 (two) times daily. 12/17/16  Yes McClung, Dionne Bucy, PA-C  valACYclovir (VALTREX) 500 MG tablet Take 1 tablet (500 mg total) by mouth daily. 04/14/17  Yes Arnoldo Morale, MD  liraglutide (VICTOZA) 18 MG/3ML SOPN Inject 0.3 mLs (1.8 mg total) into the skin daily with breakfast. 0.56m daily for 1 week then 0.261mfor one week then 0.55m555mhereafter Patient not taking: Reported on 04/28/2017 04/26/17   AmaArnoldo MoraleD    Physical Exam: Vitals:   04/28/17 0300 04/28/17 0400 04/28/17 0430 04/28/17 0500  BP: 132/66 (!) 153/76 (!) 135/50 (!) 158/83  Pulse: 77 78 83 78  Resp: 13 10 13 12   Temp:      TempSrc:      SpO2: 96% 99% 97% 100%      Constitutional: moderately built and nourished. Vitals:   04/28/17 0300 04/28/17 0400 04/28/17 0430 04/28/17 0500  BP: 132/66 (!) 153/76 (!) 135/50 (!) 158/83  Pulse: 77 78 83 78  Resp: 13 10 13 12   Temp:      TempSrc:      SpO2: 96% 99% 97% 100%   Eyes: anicteric no pallor. ENMT: no discharge from the ears eyes nose or mouth. Neck: no JVD appreciated no mass felt. Respiratory: no rhonchi or crepitations. Cardiovascular: S1-S2 heard no murmurs appreciated. Abdomen: soft nontender bowel sounds present. Musculoskeletal: bilateral lower extremity edema. Skin: no rash. Neurologic:alert awake oriented to time place and person. Moves all extremities. Psychiatric: appears normal. Normal affect.   Labs on Admission: I have personally reviewed following labs and imaging studies  CBC:  Recent Labs Lab 04/28/17 0003  WBC 6.5  HGB 10.0*  HCT 30.6*  MCV 85.7  PLT [id number] Metabolic Panel:  Recent Labs Lab 04/28/17 0003  NA 136  K 3.7  CL 99*  CO2 26  GLUCOSE 198*   BUN 69*  CREATININE 2.19*  CALCIUM 8.7*   GFR: Estimated Creatinine Clearance: 40.6 mL/min (A) (by C-G formula based on SCr of 2.19 mg/dL (H)). Liver Function Tests: No results for input(s): AST, ALT, ALKPHOS, BILITOT,  PROT, ALBUMIN in the last 168 hours. No results for input(s): LIPASE, AMYLASE in the last 168 hours. No results for input(s): AMMONIA in the last 168 hours. Coagulation Profile: No results for input(s): INR, PROTIME in the last 168 hours. Cardiac Enzymes: No results for input(s): CKTOTAL, CKMB, CKMBINDEX, TROPONINI in the last 168 hours. BNP (last 3 results)  Recent Labs  11/10/16 1418  PROBNP 202   HbA1C: No results for input(s): HGBA1C in the last 72 hours. CBG: No results for input(s): GLUCAP in the last 168 hours. Lipid Profile: No results for input(s): CHOL, HDL, LDLCALC, TRIG, CHOLHDL, LDLDIRECT in the last 72 hours. Thyroid Function Tests: No results for input(s): TSH, T4TOTAL, FREET4, T3FREE, THYROIDAB in the last 72 hours. Anemia Panel: No results for input(s): VITAMINB12, FOLATE, FERRITIN, TIBC, IRON, RETICCTPCT in the last 72 hours. Urine analysis:    Component Value Date/Time   COLORURINE YELLOW 12/12/2016 1948   APPEARANCEUR HAZY (A) 12/12/2016 1948   LABSPEC 1.011 12/12/2016 1948   PHURINE 5.0 12/12/2016 1948   GLUCOSEU >=500 (A) 12/12/2016 1948   HGBUR NEGATIVE 12/12/2016 1948   BILIRUBINUR neg 12/17/2016 1538   KETONESUR NEGATIVE 12/12/2016 1948   PROTEINUR >=300 12/17/2016 1538   PROTEINUR 100 (A) 12/12/2016 1948   UROBILINOGEN 0.2 12/17/2016 1538   UROBILINOGEN 0.2 05/29/2010 2100   NITRITE neg 12/17/2016 1538   NITRITE NEGATIVE 12/12/2016 1948   LEUKOCYTESUR Negative 12/17/2016 1538   Sepsis Labs: @LABRCNTIP (procalcitonin:4,lacticidven:4) )No results found for this or any previous visit (from the past 240 hour(s)).   Radiological Exams on Admission: Dg Chest 2 View  Result Date: 04/28/2017 CLINICAL DATA:  Shortness of  breath on exertion today. Left-sided chest pain. History of CHF, diabetes, hypertension. EXAM: CHEST  2 VIEW COMPARISON:  03/17/2017 FINDINGS: The heart size and mediastinal contours are within normal limits. Both lungs are clear. The visualized skeletal structures are unremarkable. IMPRESSION: No active cardiopulmonary disease. Electronically Signed   By: Lucienne Capers M.D.   On: 04/28/2017 00:33    EKG: Independently reviewed. Initial EKG was showing inferior T wave inversions which improved after sublingual nitroglycerin. Sinus rhythm.  Assessment/Plan Principal Problem:   Chest pain Active Problems:   Diabetes mellitus type 2, uncontrolled, with complications (HCC)   Anemia in stage 3 chronic kidney disease (HCC)   Essential hypertension   CKD (chronic kidney disease) stage 4, GFR 15-29 ml/min (HCC)   OSA (obstructive sleep apnea)   Hep C w/o coma, chronic (HCC)   History of non-ST elevation myocardial infarction (NSTEMI)   Chronic gout due to renal impairment of multiple sites without tophus   Acute on chronic combined systolic and diastolic CHF, NYHA class 4 ([employer])    1. Chest pain concerning for unstable  - will cycle cardiac markers. Continue with beta blockers statins and antiplatelet agents. Patient is on Imdur. When necessary nitroglycerin. Cardiology consulted. Check d-dimer. 2. Chronic combined systolic and diastolic CHF last EF measured in August 2018 was 60-65% with grade 1 diastolic dysfunction - patient is on Lasix 80 mg by mouth in a.m and 40 mg in the evening. Probably will need IV Lasix if cardiac cath not planned. Patient is on Imdur and hydralazine. Cannot get ARB or ACE inhibitor due to renal failure. 3. Chronic and disease stage IV - creatinine appears to be at baseline. 4. Diabetes mellitus type 2 - I am decreasing patient's Lantus dose from 30 units twice a day to 20 units in anticipation of procedure and patient being nothing  by mouth. 5. Iron deficiency anemia on  iron supplements. 6. History of gout on allopurinol and colchicine. 7. Sleep apnea on CPAP. 8. Hypertension on amlodipine and hydralazine and beta blockers. Patient is also on Imdur.  I have reviewed patient's old charts and labs.   DVT prophylaxis: heparin. Code Status: full code.  Family Communication: discussed with patient.  Disposition Plan: home.  Consults called: cardiology.  Admission status: observation.    Rise Patience MD [facility] Pager [phone].  If 7PM-7AM, please contact night-coverage [email or web address] Password [email or web address]  04/28/2017, 5:17 AM

## 2017-04-28 NOTE — ED Provider Notes (Addendum)
[facility] EMERGENCY DEPARTMENT Provider Note   CSN: [id number] Arrival date & time: 04/27/17  2351     History   Chief Complaint Chief Complaint  Patient presents with  . Chest Pain  . Shortness of Breath    HPI [name] is a 61 y.o. female.  Patient presents to the emergency department for evaluation of chest pain and shortness of breath. Patient reports that she has been experiencing increased shortness of breath and swelling of her legs for the last several days. Today she developed chest pain associated with exertion. Pain is on the left side of her chest and feels similar to NSTEMI she had in March. She took 2 nitroglycerin at home and initially did not have relief, reports that her pain is starting to go away now.      Past Medical History:  Diagnosis Date  . Aortic stenosis    Echo 8/18: mean 13, peak 28, LVOT/AV mean velocity 0.51  . Asthma    As a child   . Bronchitis   . CAD (coronary artery disease)    a. 09/2016: 50% Ost 1st Mrg stenosis, 50% 2nd Mrg stenosis, 20% Mid-Cx, 95% Prox LAD, 40% mid-LAD, and 10% dist-LAD stenosis. Staged PCI with DES to Prox-LAD.   [name] Chronic combined systolic and diastolic CHF (congestive heart failure) ([place]) 2011   echo 2/18: EF 55-60, normal wall motion, grade 2 diastolic dysfunction, trivial AI // echo 3/18: Septal and apical HK, EF 45-50, normal wall motion, trivial AI, mild LAE, PASP 38 // echo 8/18: EF 60-65, normal wall motion, grade 1 diastolic dysfunction, calcified aortic valve leaflets, mild aortic stenosis (mean 13, peak 28, LVOT/AV mean velocity 0.51), mild AI, moderate MAC, mild LAE, trivial TR   . Complication of anesthesia   . Diabetes mellitus Dx 1989  . Hepatitis C Dx 2013  . Hypertension Dx 1989  . Obesity   . Pancreatitis 2013  . Refusal of blood transfusions as patient is Jehovah's Witness   . Tendinitis   . Ulcer 2010    Patient Active Problem List   Diagnosis Date Noted  . Acute  on chronic combined systolic and diastolic CHF, NYHA class 4 ([place]) 03/19/2017  . Unstable angina ([place]) 03/17/2017  . Aortic stenosis   . Herpes simplex infection of genitourinary system 02/12/2017  . Chronic pain of right knee 02/12/2017  . Chronic gout due to renal impairment of multiple sites without tophus 02/12/2017  . Esophageal dysphagia 02/12/2017  . Osteoarthritis 01/22/2017  . Diabetic hyperosmolar non-ketotic state ([place]) 12/13/2016  . CAD (coronary artery disease) 12/13/2016  . Hyperlipidemia 12/13/2016  . Hyperphosphatemia 12/13/2016  . History of non-ST elevation myocardial infarction (NSTEMI) 09/13/2016  . Acute myopericarditis   . Injury of left hand 02/24/2016  . Asthma 11/29/2015  . Trigger middle finger 11/25/2015  . Depression 09/05/2015  . GERD (gastroesophageal reflux disease) 03/25/2015  . Precordial pain   . Environmental allergies 03/14/2015  . Hep C w/o coma, chronic ([place]) 01/31/2015  . Diabetic neuropathy ([place]) 01/31/2015  . OSA (obstructive sleep apnea) 01/01/2015  . Poor dentition 11/13/2014  . Essential hypertension 10/08/2014  . Obesity 10/08/2014  . CKD (chronic kidney disease) stage 4, GFR 15-29 ml/min ([facility]) 10/08/2014  . Diabetes mellitus type 2, uncontrolled, with complications ([place]) 56/21/3086    Class: Chronic  . Chronic diastolic CHF (congestive heart failure), NYHA class 2 ([place]) 03/25/2012  . Anemia in stage 3 chronic kidney disease ([place]) 03/25/2012  . Chronic  combined systolic and diastolic CHF (congestive heart failure) ([place]) 07/13/2009    Past Surgical History:  Procedure Laterality Date  . CHOLECYSTECTOMY    . CORONARY STENT INTERVENTION N/A 09/18/2016   Procedure: Coronary Stent Intervention;  Surgeon: Troy Sine, MD;  Location: [facility];  Service: Cardiovascular;  Laterality: N/A;  . EYE SURGERY    . KNEE ARTHROSCOPY    . LEFT HEART CATH N/A 09/18/2016   Procedure: Left Heart Cath;  Surgeon: Troy Sine, MD;  Location:  [facility];  Service: Cardiovascular;  Laterality: N/A;  . LEFT HEART CATH AND CORONARY ANGIOGRAPHY N/A 09/16/2016   Procedure: Left Heart Cath and Coronary Angiography;  Surgeon: Burnell Blanks, MD;  Location: [facility];  Service: Cardiovascular;  Laterality: N/A;  . TUBAL LIGATION    . TUBAL LIGATION      OB History    No data available       Home Medications    Prior to Admission medications   Medication Sig Start Date End Date Taking? Authorizing Provider  acetaminophen-codeine (TYLENOL #3) 300-30 MG tablet Take 1 tablet by mouth every 12 (twelve) hours as needed for severe pain. For osteoarthritis (unable to take NSAIDs due to CKD) 04/14/17  Yes Amao, Charlane Ferretti, MD  albuterol (PROVENTIL) (2.5 MG/3ML) 0.083% nebulizer solution Take 3 mLs (2.5 mg total) by nebulization every 6 (six) hours as needed for wheezing or shortness of breath. 12/17/16  Yes Argentina Donovan, PA-C  albuterol (VENTOLIN HFA) 108 (90 Base) MCG/ACT inhaler Inhale 2 puffs into the lungs every 4 (four) hours as needed for wheezing or shortness of breath. 08/03/16  Yes Funches, Josalyn, MD  allopurinol (ZYLOPRIM) 100 MG tablet Take 1 tablet (100 mg total) by mouth daily. 02/12/17  Yes Funches, Josalyn, MD  amLODipine (NORVASC) 10 MG tablet Take 1 tablet (10 mg total) by mouth daily. 04/14/17  Yes Arnoldo Morale, MD  aspirin EC 81 MG EC tablet Take 1 tablet (81 mg total) by mouth daily. 09/19/16  Yes Strader, [place] M, PA-C  atorvastatin (LIPITOR) 80 MG tablet Take 1 tablet (80 mg total) by mouth daily at 6 PM. 02/12/17  Yes Funches, Josalyn, MD  Blood Glucose Monitoring Suppl (ACCU-CHEK AVIVA) device Use as instructed daily. 04/14/17  Yes Arnoldo Morale, MD  carvedilol (COREG) 25 MG tablet TAKE 1 TABLET BY MOUTH 2 TIMES DAILY WITH A MEAL. 02/12/17  Yes Funches, Josalyn, MD  Colchicine 0.6 MG CAPS Take 0.6 mg by mouth daily. 04/14/17  Yes Arnoldo Morale, MD  ferrous sulfate 325 (65 FE) MG tablet Take 1 tablet (325  mg total) by mouth 2 (two) times daily with a meal. 04/14/17  Yes Amao, Enobong, MD  fluticasone (FLONASE) 50 MCG/ACT nasal spray Place 2 sprays into both nostrils daily as needed for allergies or rhinitis. 01/07/17  Yes Funches, Josalyn, MD  Fluticasone-Salmeterol (ADVAIR DISKUS) 250-50 MCG/DOSE AEPB Inhale 1 puff into the lungs 2 (two) times daily. Patient taking differently: Inhale 1 puff into the lungs 2 (two) times daily as needed (shortness of breath).  12/17/16  Yes McClung, Angela M, PA-C  furosemide (LASIX) 40 MG tablet Take 2 pills (80 mg) in the morning and 40 mg in the afternoon Patient taking differently: Take 40-80 mg by mouth See admin instructions. Take 2 pills (80 mg) in the morning and 40 mg in the afternoon 02/17/17  Yes Sherren Mocha, MD  glucose blood (ACCU-CHEK AVIVA) test strip Use as instructed daily 04/14/17  Yes Arnoldo Morale, MD  glucose blood (TRUE METRIX BLOOD GLUCOSE TEST) test strip Use as instructed 03/19/17  Yes Barrett, Rhonda G, PA-C  hydrALAZINE (APRESOLINE) 50 MG tablet Take 1 tablet (50 mg total) by mouth 3 (three) times daily. 02/12/17  Yes Funches, Josalyn, MD  hydrOXYzine (ATARAX/VISTARIL) 50 MG tablet TAKE 1 TABLET (50 MG TOTAL) BY MOUTH 3 (THREE) TIMES DAILY AS NEEDED. Patient taking differently: Take 50 mg by mouth 3 (three) times daily as needed for itching.  03/02/17  Yes Jegede, Olugbemiga E, MD  insulin aspart (NOVOLOG) 100 UNIT/ML injection 0-12 units subcutaneously 3 times daily before meals according to sliding scale 04/14/17  Yes Amao, Charlane Ferretti, MD  Insulin Glargine (LANTUS SOLOSTAR) 100 UNIT/ML Solostar Pen Inject 40 Units into the skin 2 (two) times daily. Patient taking differently: Inject 30 Units into the skin 2 (two) times daily.  04/14/17  Yes Arnoldo Morale, MD  isosorbide mononitrate (IMDUR) 60 MG 24 hr tablet Take 1 tablet (60 mg total) by mouth daily. 03/19/17  Yes Barrett, Evelene Croon, PA-C  ketotifen (ZADITOR) 0.025 % ophthalmic solution Place 1 drop into  both eyes daily as needed (for dry eyes). 07/27/16  Yes Boykin Nearing, MD  Lancet Devices [facility]) lancets Use as instructed daily. 04/14/17  Yes Arnoldo Morale, MD  nitroGLYCERIN (NITROSTAT) 0.4 MG SL tablet Place 1 tablet (0.4 mg total) under the tongue every 5 (five) minutes x 3 doses as needed for chest pain. 03/19/17  Yes Barrett, Evelene Croon, PA-C  omeprazole (PRILOSEC) 20 MG capsule Take 1 capsule (20 mg total) by mouth daily. 04/14/17  Yes Arnoldo Morale, MD  oxyCODONE-acetaminophen (PERCOCET/ROXICET) 5-325 MG tablet Take 1 tablet by mouth every 6 (six) hours as needed for severe pain. 04/11/17  Yes Lawyer, Harrell Gave, PA-C  pregabalin (LYRICA) 100 MG capsule Take 1 capsule (100 mg total) by mouth 3 (three) times daily. 03/12/17  Yes Tresa Garter, MD  ticagrelor (BRILINTA) 90 MG TABS tablet Take 1 tablet (90 mg total) by mouth 2 (two) times daily. 12/17/16  Yes McClung, Dionne Bucy, PA-C  valACYclovir (VALTREX) 500 MG tablet Take 1 tablet (500 mg total) by mouth daily. 04/14/17  Yes Arnoldo Morale, MD  liraglutide (VICTOZA) 18 MG/3ML SOPN Inject 0.3 mLs (1.8 mg total) into the skin daily with breakfast. 0.57m daily for 1 week then 0.229mfor one week then 0.4m35mhereafter Patient not taking: Reported on 04/28/2017 04/26/17   AmaArnoldo MoraleD    Family History Family History  Problem Relation Age of Onset  . Colon cancer Mother   . Heart attack Other   . Heart attack Maternal Grandmother   . Hypertension Sister   . Hypertension Brother   . Diabetes Paternal Grandmother   . Breast cancer Neg Hx     Social History Social History  Substance Use Topics  . Smoking status: Former Smoker    Quit date: 10/25/1980  . Smokeless tobacco: Never Used     Comment: quit smoking in 1982  . Alcohol use Yes     Comment: 3 times in last year     Allergies   Shellfish allergy; Diazepam; and Morphine and related   Review of Systems Review of Systems  Respiratory: Positive for  shortness of breath.   Cardiovascular: Positive for chest pain and leg swelling.  All other systems reviewed and are negative.    Physical Exam Updated Vital Signs BP 132/66   Pulse 77   Temp 98 F (36.7 C) (Oral)  Resp 13   SpO2 96%   Physical Exam  Constitutional: She is oriented to person, place, and time. She appears well-developed and well-nourished. No distress.  HENT:  Head: Normocephalic and atraumatic.  Right Ear: Hearing normal.  Left Ear: Hearing normal.  Nose: Nose normal.  Mouth/Throat: Oropharynx is clear and moist and mucous membranes are normal.  Eyes: Pupils are equal, round, and reactive to light. Conjunctivae and EOM are normal.  Neck: Normal range of motion. Neck supple.  Cardiovascular: Regular rhythm, S1 normal and S2 normal.  Exam reveals no gallop and no friction rub.   No murmur heard. Pulmonary/Chest: Effort normal and breath sounds normal. No respiratory distress. She exhibits no tenderness.  Abdominal: Soft. Normal appearance and bowel sounds are normal. There is no hepatosplenomegaly. There is no tenderness. There is no rebound, no guarding, no tenderness at McBurney's point and negative Murphy's sign. No hernia.  Musculoskeletal: Normal range of motion. She exhibits edema.  Neurological: She is alert and oriented to person, place, and time. She has normal strength. No cranial nerve deficit or sensory deficit. Coordination normal. GCS eye subscore is 4. GCS verbal subscore is 5. GCS motor subscore is 6.  Skin: Skin is warm, dry and intact. No rash noted. No cyanosis.  Psychiatric: She has a normal mood and affect. Her speech is normal and behavior is normal. Thought content normal.  Nursing note and vitals reviewed.    ED Treatments / Results  Labs (all labs ordered are listed, but only abnormal results are displayed) Labs Reviewed  BASIC METABOLIC PANEL - Abnormal; Notable for the following:       Result Value   Chloride 99 (*)    Glucose,  Bld 198 (*)    BUN 69 (*)    Creatinine, Ser 2.19 (*)    Calcium 8.7 (*)    GFR calc non Af Amer 23 (*)    GFR calc Af Amer 27 (*)    All other components within normal limits  CBC - Abnormal; Notable for the following:    RBC 3.57 (*)    Hemoglobin 10.0 (*)    HCT 30.6 (*)    RDW 17.5 (*)    All other components within normal limits  BRAIN NATRIURETIC PEPTIDE  I-STAT TROPONIN, ED  I-STAT BETA HCG BLOOD, ED ([facility], [facility], [facility] ONLY)    EKG  EKG Interpretation  Date/Time:  Tuesday April 27 2017 23:59:04 EDT Ventricular Rate:  82 PR Interval:  136 QRS Duration: 90 QT Interval:  434 QTC Calculation: 507 R Axis:   74 Text Interpretation:  Normal sinus rhythm T wave abnormality, consider inferior ischemia , new since Sept 2018 Prolonged QT Abnormal ECG Confirmed by Orpah Greek [phone]) on 04/28/2017 2:02:32 AM       Radiology Dg Chest 2 View  Result Date: 04/28/2017 CLINICAL DATA:  Shortness of breath on exertion today. Left-sided chest pain. History of CHF, diabetes, hypertension. EXAM: CHEST  2 VIEW COMPARISON:  03/17/2017 FINDINGS: The heart size and mediastinal contours are within normal limits. Both lungs are clear. The visualized skeletal structures are unremarkable. IMPRESSION: No active cardiopulmonary disease. Electronically Signed   By: Lucienne Capers M.D.   On: 04/28/2017 00:33   EKG Interpretation  Date/Time:  Wednesday April 28 2017 04:07:16 EDT Ventricular Rate:  82 PR Interval:  136 QRS Duration: 94 QT Interval:  459 QTC Calculation: 537 R Axis:   55 Text Interpretation:  Sinus rhythm Nonspecific T abnormalities, lateral leads Prolonged QT  interval inferior T wave inversions and St depression is resolved Confirmed by Orpah Greek [phone]) on 04/28/2017 4:11:58 AM   Procedures Procedures (including critical care time)  Medications Ordered in ED Medications  nitroGLYCERIN (NITROSTAT) SL tablet 0.4 mg (0.4 mg Sublingual Given 04/28/17  0258)  aspirin chewable tablet 324 mg (324 mg Oral Given 04/28/17 0246)     Initial Impression / Assessment and Plan / ED Course  I have reviewed the triage vital signs and the nursing notes.  Pertinent labs & imaging results that were available during my care of the patient were reviewed by me and considered in my medical decision making (see chart for details).     Patient with history of coronary artery disease and congestive heart failure presents to the ER with progressively worsening shortness of breath, leg swelling followed by onset of exertional chest pain tonight. Patient had an NSTEMIin March, at which time her LAD was 95% occluded and she had stenting. She reports that her symptoms are similar today. EKG does have evidence of inferior ST depressions that were not seen on previous EKGs. No ST elevation.   Patient administered sublingual nitroglycerin with resolution of her pain. ST depressions have now resolved upon repeat EKG after pain-free.  Patient will require hospitalization for further management.  Final Clinical Impressions(s) / ED Diagnoses   Final diagnoses:  Precordial pain    New Prescriptions New Prescriptions   No medications on file     Orpah Greek, MD 04/28/17 0410    Orpah Greek, MD 04/28/17 [phone]

## 2017-04-28 NOTE — Progress Notes (Addendum)
Patient was seen and evaluated earl area please refer to H&P for details regarding history, assessment and plan.    61 y.o. femalewith history of CAD status post stenting in March 2018, CHF, chronic kidney disease stage IV, diabetes mellitus type 2, hepatitis C presents to the ER because of increasing exertional shortness of breath and fatigue since morning with chest pain. Patient's chest pain started last night around 10 PM. Pain is retrosternal with mild radiation to the left arm.  For H&P cardiology consulted.   Gen.: Patient in no acute distress Cardiovascular: No cyanosis Pulmonary: No Audible wheezes, equal chest rise  Will reassess next am.  Troponin negative x 2  Lacretia Tindall  Addendum: Despite report of cardiology consult, cardiology team reports they did not get consult. Will consult and place order for echocardiogram.  Primary cardiologist as outpatient: Dr. Burt Knack

## 2017-04-28 NOTE — Consult Note (Signed)
Cardiology Consultation:   Patient ID: [name]; [id number]; 04/26/1956   Admit date: 04/28/2017 Date of Consult: 04/28/2017  Primary Care Provider: Arnoldo Morale, MD Primary Cardiologist: Dr Burt Knack   Patient Profile:   [name] is a 61 y.o. female with a hx of CAD who is being seen today for the evaluation of chest pain  at the request of Dr Wendee Beavers.  History of Present Illness:   [name] is an obese AA female with a history of IDDM, CRI-4, HTN, HLD, OSA, Hep C, and CAD. She had an MI March 2018. Cath revealed high grade pLAD disease. She had a PCI 48 hrs later with a DES. Her EF then was in the 40's but echo Aug 2018 showed her EF to be 60-65% with grade 1 DD. She has had some chest discomfort off and on since, but not like her presenting symptoms. It was felt she had a component of diastolic CHF in September as well as chest pain. Initially, cardiac catheterization was considered, but her renal function worsened and it was not performed. The decision was made to do medical management. Her Imdur was increased  She was admitted early this am with chest pain and dyspnea. Her Troponin has been negative, BNP 38, CXR did not suggest CHF. She says she was at home making sandwiches when she became SOB and complained of localized Lt chest pain. NTG did not help. She still has chest pain "comes and goes" though not as severe.    Past Medical History:  Diagnosis Date  . Aortic stenosis    Echo 8/18: mean 13, peak 28, LVOT/AV mean velocity 0.51  . Asthma    As a child   . Bronchitis   . CAD (coronary artery disease)    a. 09/2016: 50% Ost 1st Mrg stenosis, 50% 2nd Mrg stenosis, 20% Mid-Cx, 95% Prox LAD, 40% mid-LAD, and 10% dist-LAD stenosis. Staged PCI with DES to Prox-LAD.   [name] Chronic combined systolic and diastolic CHF (congestive heart failure) ([place]) 2011   echo 2/18: EF 55-60, normal wall motion, grade 2 diastolic dysfunction, trivial AI // echo 3/18: Septal and apical HK, EF  45-50, normal wall motion, trivial AI, mild LAE, PASP 38 // echo 8/18: EF 60-65, normal wall motion, grade 1 diastolic dysfunction, calcified aortic valve leaflets, mild aortic stenosis (mean 13, peak 28, LVOT/AV mean velocity 0.51), mild AI, moderate MAC, mild LAE, trivial TR   . Complication of anesthesia   . Diabetes mellitus Dx 1989  . Hepatitis C Dx 2013  . Hypertension Dx 1989  . Obesity   . Pancreatitis 2013  . Refusal of blood transfusions as patient is Jehovah's Witness   . Tendinitis   . Ulcer 2010    Past Surgical History:  Procedure Laterality Date  . CHOLECYSTECTOMY    . CORONARY STENT INTERVENTION N/A 09/18/2016   Procedure: Coronary Stent Intervention;  Surgeon: Troy Sine, MD;  Location: [facility];  Service: Cardiovascular;  Laterality: N/A;  . EYE SURGERY    . KNEE ARTHROSCOPY    . LEFT HEART CATH N/A 09/18/2016   Procedure: Left Heart Cath;  Surgeon: Troy Sine, MD;  Location: [facility];  Service: Cardiovascular;  Laterality: N/A;  . LEFT HEART CATH AND CORONARY ANGIOGRAPHY N/A 09/16/2016   Procedure: Left Heart Cath and Coronary Angiography;  Surgeon: Burnell Blanks, MD;  Location: [facility];  Service: Cardiovascular;  Laterality: N/A;  . TUBAL LIGATION    .  TUBAL LIGATION       Home Medications:  Prior to Admission medications   Medication Sig Start Date End Date Taking? Authorizing Provider  acetaminophen-codeine (TYLENOL #3) 300-30 MG tablet Take 1 tablet by mouth every 12 (twelve) hours as needed for severe pain. For osteoarthritis (unable to take NSAIDs due to CKD) 04/14/17  Yes Amao, Charlane Ferretti, MD  albuterol (PROVENTIL) (2.5 MG/3ML) 0.083% nebulizer solution Take 3 mLs (2.5 mg total) by nebulization every 6 (six) hours as needed for wheezing or shortness of breath. 12/17/16  Yes Argentina Donovan, PA-C  albuterol (VENTOLIN HFA) 108 (90 Base) MCG/ACT inhaler Inhale 2 puffs into the lungs every 4 (four) hours as needed for  wheezing or shortness of breath. 08/03/16  Yes Funches, Josalyn, MD  allopurinol (ZYLOPRIM) 100 MG tablet Take 1 tablet (100 mg total) by mouth daily. 02/12/17  Yes Funches, Josalyn, MD  amLODipine (NORVASC) 10 MG tablet Take 1 tablet (10 mg total) by mouth daily. 04/14/17  Yes Arnoldo Morale, MD  aspirin EC 81 MG EC tablet Take 1 tablet (81 mg total) by mouth daily. 09/19/16  Yes Strader, [place] M, PA-C  atorvastatin (LIPITOR) 80 MG tablet Take 1 tablet (80 mg total) by mouth daily at 6 PM. 02/12/17  Yes Funches, Josalyn, MD  Blood Glucose Monitoring Suppl (ACCU-CHEK AVIVA) device Use as instructed daily. 04/14/17  Yes Arnoldo Morale, MD  carvedilol (COREG) 25 MG tablet TAKE 1 TABLET BY MOUTH 2 TIMES DAILY WITH A MEAL. 02/12/17  Yes Funches, Josalyn, MD  Colchicine 0.6 MG CAPS Take 0.6 mg by mouth daily. 04/14/17  Yes Arnoldo Morale, MD  ferrous sulfate 325 (65 FE) MG tablet Take 1 tablet (325 mg total) by mouth 2 (two) times daily with a meal. 04/14/17  Yes Amao, Enobong, MD  fluticasone (FLONASE) 50 MCG/ACT nasal spray Place 2 sprays into both nostrils daily as needed for allergies or rhinitis. 01/07/17  Yes Funches, Josalyn, MD  Fluticasone-Salmeterol (ADVAIR DISKUS) 250-50 MCG/DOSE AEPB Inhale 1 puff into the lungs 2 (two) times daily. Patient taking differently: Inhale 1 puff into the lungs 2 (two) times daily as needed (shortness of breath).  12/17/16  Yes McClung, Angela M, PA-C  furosemide (LASIX) 40 MG tablet Take 2 pills (80 mg) in the morning and 40 mg in the afternoon Patient taking differently: Take 40-80 mg by mouth See admin instructions. Take 2 pills (80 mg) in the morning and 40 mg in the afternoon 02/17/17  Yes Sherren Mocha, MD  glucose blood (ACCU-CHEK AVIVA) test strip Use as instructed daily 04/14/17  Yes Arnoldo Morale, MD  glucose blood (TRUE METRIX BLOOD GLUCOSE TEST) test strip Use as instructed 03/19/17  Yes Barrett, Evelene Croon, PA-C  hydrALAZINE (APRESOLINE) 50 MG tablet Take 1 tablet (50 mg  total) by mouth 3 (three) times daily. 02/12/17  Yes Funches, Josalyn, MD  hydrOXYzine (ATARAX/VISTARIL) 50 MG tablet TAKE 1 TABLET (50 MG TOTAL) BY MOUTH 3 (THREE) TIMES DAILY AS NEEDED. Patient taking differently: Take 50 mg by mouth 3 (three) times daily as needed for itching.  03/02/17  Yes Jegede, Olugbemiga E, MD  insulin aspart (NOVOLOG) 100 UNIT/ML injection 0-12 units subcutaneously 3 times daily before meals according to sliding scale 04/14/17  Yes Amao, Charlane Ferretti, MD  Insulin Glargine (LANTUS SOLOSTAR) 100 UNIT/ML Solostar Pen Inject 40 Units into the skin 2 (two) times daily. Patient taking differently: Inject 30 Units into the skin 2 (two) times daily.  04/14/17  Yes Arnoldo Morale, MD  isosorbide mononitrate (  IMDUR) 60 MG 24 hr tablet Take 1 tablet (60 mg total) by mouth daily. 03/19/17  Yes Barrett, Evelene Croon, PA-C  ketotifen (ZADITOR) 0.025 % ophthalmic solution Place 1 drop into both eyes daily as needed (for dry eyes). 07/27/16  Yes Boykin Nearing, MD  Lancet Devices [facility]) lancets Use as instructed daily. 04/14/17  Yes Arnoldo Morale, MD  nitroGLYCERIN (NITROSTAT) 0.4 MG SL tablet Place 1 tablet (0.4 mg total) under the tongue every 5 (five) minutes x 3 doses as needed for chest pain. 03/19/17  Yes Barrett, Evelene Croon, PA-C  omeprazole (PRILOSEC) 20 MG capsule Take 1 capsule (20 mg total) by mouth daily. 04/14/17  Yes Arnoldo Morale, MD  oxyCODONE-acetaminophen (PERCOCET/ROXICET) 5-325 MG tablet Take 1 tablet by mouth every 6 (six) hours as needed for severe pain. 04/11/17  Yes Lawyer, Harrell Gave, PA-C  pregabalin (LYRICA) 100 MG capsule Take 1 capsule (100 mg total) by mouth 3 (three) times daily. 03/12/17  Yes Tresa Garter, MD  ticagrelor (BRILINTA) 90 MG TABS tablet Take 1 tablet (90 mg total) by mouth 2 (two) times daily. 12/17/16  Yes McClung, Dionne Bucy, PA-C  valACYclovir (VALTREX) 500 MG tablet Take 1 tablet (500 mg total) by mouth daily. 04/14/17  Yes Arnoldo Morale, MD    liraglutide (VICTOZA) 18 MG/3ML SOPN Inject 0.3 mLs (1.8 mg total) into the skin daily with breakfast. 0.99m daily for 1 week then 0.249mfor one week then 0.35m835mhereafter Patient not taking: Reported on 04/28/2017 04/26/17   AmaArnoldo MoraleD    Inpatient Medications: Scheduled Meds: . allopurinol  100 mg Oral Daily  . amLODipine  10 mg Oral Daily  . aspirin EC  81 mg Oral Daily  . atorvastatin  80 mg Oral q1800  . carvedilol  25 mg Oral BID WC  . colchicine  0.6 mg Oral Daily  . ferrous sulfate  325 mg Oral BID WC  . furosemide  40 mg Oral QPM  . furosemide  80 mg Oral q morning - 10a  . heparin  5,000 Units Subcutaneous Q8H  . hydrALAZINE  50 mg Oral TID  . insulin aspart  0-9 Units Subcutaneous TID WC  . insulin glargine  20 Units Subcutaneous BID  . isosorbide mononitrate  60 mg Oral Daily  . mometasone-formoterol  2 puff Inhalation BID  . nitroGLYCERIN  0.5 inch Topical Q6H  . pantoprazole  40 mg Oral Daily  . pregabalin  100 mg Oral TID  . ticagrelor  90 mg Oral BID  . valACYclovir  500 mg Oral Daily   Continuous Infusions:  PRN Meds: acetaminophen, albuterol, fluticasone, hydrOXYzine, ketotifen, ondansetron (ZOFRAN) IV  Allergies:    Allergies  Allergen Reactions  . Shellfish Allergy Anaphylaxis and Swelling  . Diazepam Other (See Comments)    "felt like out of body experience"  . Morphine And Related Itching    Social History:   Social History   Social History  . Marital status: Divorced    Spouse name: N/A  . Number of children: N/A  . Years of education: N/A   Occupational History  . Not on file.   Social History Main Topics  . Smoking status: Former Smoker    Quit date: 10/25/1980  . Smokeless tobacco: Never Used     Comment: quit smoking in 1982  . Alcohol use Yes     Comment: 3 times in last year  . Drug use: No     Comment: 08/21/2016 "clean since 05/1998"  .  Sexual activity: Not on file     Comment: Not asked   Other Topics Concern  .  Not on file   Social History Narrative  . No narrative on file    Family History:    Family History  Problem Relation Age of Onset  . Colon cancer Mother   . Heart attack Other   . Heart attack Maternal Grandmother   . Hypertension Sister   . Hypertension Brother   . Diabetes Paternal Grandmother   . Breast cancer Neg Hx      ROS:  Please see the history of present illness.  ROS  All other ROS reviewed and negative.     Physical Exam/Data:   Vitals:   04/28/17 0730 04/28/17 0900 04/28/17 1038 04/28/17 1300  BP: (!) 149/79 (!) 176/75  140/67  Pulse: 77 81  79  Resp: 12 18  18   Temp:  98 F (36.7 C)  98.3 F (36.8 C)  TempSrc:  Oral  Oral  SpO2: 98% 99% 99% 95%  Weight:  (!) 300 lb 8 oz (136.3 kg)    Height:  5' 8"  (1.727 m)      Intake/Output Summary (Last 24 hours) at 04/28/17 1832 Last data filed at 04/28/17 1148  Gross per 24 hour  Intake               60 ml  Output              500 ml  Net             -440 ml   Filed Weights   04/28/17 0900  Weight: (!) 300 lb 8 oz (136.3 kg)   Body mass index is 45.69 kg/m.  General:  Obese female in no acute distress HEENT: normal Lymph: no adenopathy Neck: no JVD, transmitted murmur to carotids Endocrine:  No thryomegaly Vascular: 2+ bilateral DP pulses and Lt RA pulse, diminished Rt RA pulse Cardiac:  normal S1, S2; RRR; 3-5/ 6 systolic murmur  Lungs:  clear to auscultation bilaterally, no wheezing, rhonchi or rales  Abd: obese, soft, nontender, no hepatomegaly  Ext: no edema Musculoskeletal:  No deformities, BUE and BLE strength normal and equal Skin: warm and dry  Neuro:  CNs 2-12 intact, no focal abnormalities noted Psych:  Normal affect   EKG:  The EKG was personally reviewed and demonstrates:  NSR Telemetry:  Telemetry was personally reviewed and demonstrates:  NSR  Relevant CV Studies: Echo 03/10/17- Study Conclusions  - Left ventricle: The cavity size was normal. Wall thickness was   normal.  Systolic function was normal. The estimated ejection   fraction was in the range of 60% to 65%. Wall motion was normal;   there were no regional wall motion abnormalities. Doppler   parameters are consistent with abnormal left ventricular   relaxation (grade 1 diastolic dysfunction). - Aortic valve: Trileaflet; mildly thickened, mildly calcified   leaflets. There was mild stenosis. There was mild regurgitation.   Peak velocity (S): 265 cm/s. Mean gradient (S): 13 mm Hg. Valve   area (VTI): 1.82 cm^2. Valve area (Vmax): 1.25 cm^2. Valve area   (Vmean): 1.45 cm^2. - Mitral valve: Moderately calcified annulus. - Left atrium: The atrium was mildly dilated. - Tricuspid valve: There was trivial regurgitation.  Laboratory Data:  Chemistry Recent Labs Lab 04/28/17 0003 04/28/17 0540  NA 136  --   K 3.7  --   CL 99*  --   CO2 26  --  GLUCOSE 198*  --   BUN 69*  --   CREATININE 2.19* 2.14*  CALCIUM 8.7*  --   GFRNONAA 23* 24*  GFRAA 27* 28*  ANIONGAP 11  --     No results for input(s): PROT, ALBUMIN, AST, ALT, ALKPHOS, BILITOT in the last 168 hours. Hematology Recent Labs Lab 04/28/17 0003 04/28/17 0540  WBC 6.5 6.0  RBC 3.57* 3.44*  HGB 10.0* 9.6*  HCT 30.6* 29.7*  MCV 85.7 86.3  MCH 28.0 27.9  MCHC 32.7 32.3  RDW 17.5* 17.2*  PLT 227 202   Cardiac Enzymes Recent Labs Lab 04/28/17 0540 04/28/17 1031  TROPONINI <0.03 <0.03    Recent Labs Lab 04/28/17 0013  TROPIPOC 0.01    BNP Recent Labs Lab 04/28/17 0003  BNP 38.6    DDimer No results for input(s): DDIMER in the last 168 hours.  Radiology/Studies:  Dg Chest 2 View  Result Date: 04/28/2017 CLINICAL DATA:  Shortness of breath on exertion today. Left-sided chest pain. History of CHF, diabetes, hypertension. EXAM: CHEST  2 VIEW COMPARISON:  03/17/2017 FINDINGS: The heart size and mediastinal contours are within normal limits. Both lungs are clear. The visualized skeletal structures are unremarkable.  IMPRESSION: No active cardiopulmonary disease. Electronically Signed   By: Lucienne Capers M.D.   On: 04/28/2017 00:33   VQ scan 03/17/17- IMPRESSION: Normal ventilation-perfusion lung scan. No evidence of pulmonary Embolism.  Assessment and Plan:   1. Chest pain with a moderate risk of acute coronary syndrome: She presents with symptoms of chest pain and worsening shortness of breath. Her symptoms are concerning for unstable angina. Her ECG is not acute and her Troponin is negative. This is her second admission since her PCI in March.   2.  chronic combined systolic (congestive) and diastolic (congestive) heart failure (HCC) No evidence of volume overload on exam and her BNP and CXR are normal.   3. Nonrheumatic aortic valve stenosis Mild by recent echo. Follow  4. Essential hypertension The patient's blood pressure is controlled on her current regimen.   5. CKD (chronic kidney disease) stage 4, GFR 15-29 ml/min (HCC) She has been seen by nephrology as an OP. Her SCr is better than it was in Sept (3.20)  6. Anemia Dr Florene Glen checked iron stores and will likely start ESA. Of note, Iron studies are normal.   7. Diabetes:  type 2 IDDM  8. OSA She had not previously been on CPAP, but it was supplied to her and she will be using it at home.   Plan: Will make her NPO, hold AM Lasix, consider diagnostic cath now that her SCr is improved. MD to see.    For questions or updates, please contact [place] Please consult [email or web address] for contact info under Cardiology/STEMI.   Signed, Kerin Ransom, PA-C  04/28/2017 6:32 PM   The patient was seen, examined and discussed with Kerin Ransom, PA-C and I agree with the above.   61 y.o. female with a hx of IDDM, CRI-4, HTN, HLD, OSA, Hep C, and CAD. She had an MI March 2018  (troponin max 0.42). Cath revealed high grade pLAD 95% stenosis. She had a PCI 48 hrs later with a DES. Her EF then was in the 40's but echo Aug 2018 showed  her EF to be 60-65% with grade 1 DD.  She states that for few months she felt better, but then developed progressively worsening DOE, now after walking short distances at her house. I have just witnessed her  walking from the bathroom and she was SOB that she couldn't talk. She presented with DOE and exertional chest tightness on 04/01/17 - negative troponin, non-acute ECG, V/Q scan showed normal ventilation-perfusion lung scan. No evidence of pulmonary embolism. She presented today with worsening symptoms - ECG shows SR, non-specific ST-T wave abnormalties, unchanged from prior ECG post PCI (prior to PCI she had negative T waves in the anterior leads). On physical exam she is mildly SOB after walking, no JVD, X3,8, 2/6 systolic murmur at the LUSB, lungs are clear, no LE edema, good peripheral pulses.  I will schedule for a cath in the am, Crea is 2.1 - baseline 1.7-3.0, I will start NaCl 75 cc/hr.   Ena Dawley, MD 04/28/2017

## 2017-04-28 NOTE — ED Notes (Signed)
Pt refused 3rd nitrostat d/t the start of a headache.

## 2017-04-28 NOTE — ED Notes (Signed)
Admitting MD at bedside.

## 2017-04-28 NOTE — Progress Notes (Signed)
Nutrition Brief Note  Patient identified on the Malnutrition Screening Tool (MST) Report  Wt Readings from Last 15 Encounters:  04/28/17 (!) 300 lb 8 oz (136.3 kg)  04/14/17 (!) 300 lb 6.4 oz (136.3 kg)  04/11/17 290 lb (131.5 kg)  03/30/17 297 lb (134.7 kg)  03/19/17 (!) 302 lb 1.6 oz (137 kg)  03/17/17 297 lb (134.7 kg)  02/17/17 (!) 304 lb 6.4 oz (138.1 kg)  02/12/17 299 lb 3.2 oz (135.7 kg)  01/22/17 (!) 302 lb 9.6 oz (137.3 kg)  01/07/17 297 lb 12.8 oz (135.1 kg)  12/17/16 299 lb (135.6 kg)  12/14/16 298 lb 15.1 oz (135.6 kg)  11/17/16 (!) 301 lb 3.2 oz (136.6 kg)  11/10/16 295 lb 1.9 oz (133.9 kg)  10/26/16 293 lb 9.6 oz (133.2 kg)    Body mass index is 45.69 kg/m. Patient meets criteria for morbid obesity based on current BMI.   Pt reports her weight fluctuates up and down but no major gains or losses  Current diet order is NPO, but reports very good appetite current and PTA. Labs and medications reviewed.   Daughter at bedside. Provided "Heart-Healthy Consistent Carbohydrate Nutrition Therapy" handout. Reviewed basics of diet with pt and her daughter.   No nutrition interventions warranted at this time. If nutrition issues arise, please consult RD.   Kerman Passey MS, RD, LDN [phone] Pager  [phone] Weekend/On-Call Pager

## 2017-04-29 ENCOUNTER — Encounter (HOSPITAL_COMMUNITY): Payer: Self-pay | Admitting: Internal Medicine

## 2017-04-29 ENCOUNTER — Encounter (HOSPITAL_COMMUNITY)
Admission: EM | Disposition: A | Discharge status: Home / Self Care | Payer: Self-pay | Source: Home / Self Care | Attending: Internal Medicine

## 2017-04-29 DIAGNOSIS — I5043 Acute on chronic combined systolic (congestive) and diastolic (congestive) heart failure: Secondary | ICD-10-CM | POA: Diagnosis not present

## 2017-04-29 DIAGNOSIS — A6000 Herpesviral infection of urogenital system, unspecified: Secondary | ICD-10-CM | POA: Diagnosis present

## 2017-04-29 DIAGNOSIS — E1165 Type 2 diabetes mellitus with hyperglycemia: Secondary | ICD-10-CM | POA: Diagnosis not present

## 2017-04-29 DIAGNOSIS — M1030 Gout due to renal impairment, unspecified site: Secondary | ICD-10-CM | POA: Diagnosis present

## 2017-04-29 DIAGNOSIS — N184 Chronic kidney disease, stage 4 (severe): Secondary | ICD-10-CM | POA: Diagnosis not present

## 2017-04-29 DIAGNOSIS — G4733 Obstructive sleep apnea (adult) (pediatric): Secondary | ICD-10-CM | POA: Diagnosis present

## 2017-04-29 DIAGNOSIS — Z955 Presence of coronary angioplasty implant and graft: Secondary | ICD-10-CM | POA: Diagnosis not present

## 2017-04-29 DIAGNOSIS — M1A39X0 Chronic gout due to renal impairment, multiple sites, without tophus (tophi): Secondary | ICD-10-CM

## 2017-04-29 DIAGNOSIS — Z6841 Body Mass Index (BMI) 40.0 and over, adult: Secondary | ICD-10-CM | POA: Diagnosis not present

## 2017-04-29 DIAGNOSIS — I25110 Atherosclerotic heart disease of native coronary artery with unstable angina pectoris: Secondary | ICD-10-CM | POA: Diagnosis not present

## 2017-04-29 DIAGNOSIS — R079 Chest pain, unspecified: Secondary | ICD-10-CM | POA: Diagnosis not present

## 2017-04-29 DIAGNOSIS — K219 Gastro-esophageal reflux disease without esophagitis: Secondary | ICD-10-CM | POA: Diagnosis present

## 2017-04-29 DIAGNOSIS — D509 Iron deficiency anemia, unspecified: Secondary | ICD-10-CM | POA: Diagnosis present

## 2017-04-29 DIAGNOSIS — I252 Old myocardial infarction: Secondary | ICD-10-CM | POA: Diagnosis not present

## 2017-04-29 DIAGNOSIS — I2510 Atherosclerotic heart disease of native coronary artery without angina pectoris: Secondary | ICD-10-CM | POA: Diagnosis present

## 2017-04-29 DIAGNOSIS — I350 Nonrheumatic aortic (valve) stenosis: Secondary | ICD-10-CM | POA: Diagnosis present

## 2017-04-29 DIAGNOSIS — E118 Type 2 diabetes mellitus with unspecified complications: Secondary | ICD-10-CM | POA: Diagnosis not present

## 2017-04-29 DIAGNOSIS — B182 Chronic viral hepatitis C: Secondary | ICD-10-CM | POA: Diagnosis present

## 2017-04-29 DIAGNOSIS — R072 Precordial pain: Secondary | ICD-10-CM | POA: Diagnosis not present

## 2017-04-29 DIAGNOSIS — Z9049 Acquired absence of other specified parts of digestive tract: Secondary | ICD-10-CM | POA: Diagnosis not present

## 2017-04-29 DIAGNOSIS — I10 Essential (primary) hypertension: Secondary | ICD-10-CM | POA: Diagnosis not present

## 2017-04-29 DIAGNOSIS — N183 Chronic kidney disease, stage 3 (moderate): Secondary | ICD-10-CM | POA: Diagnosis not present

## 2017-04-29 DIAGNOSIS — Z7902 Long term (current) use of antithrombotics/antiplatelets: Secondary | ICD-10-CM | POA: Diagnosis not present

## 2017-04-29 DIAGNOSIS — I5042 Chronic combined systolic (congestive) and diastolic (congestive) heart failure: Secondary | ICD-10-CM | POA: Diagnosis present

## 2017-04-29 DIAGNOSIS — D631 Anemia in chronic kidney disease: Secondary | ICD-10-CM | POA: Diagnosis not present

## 2017-04-29 DIAGNOSIS — N179 Acute kidney failure, unspecified: Secondary | ICD-10-CM | POA: Diagnosis not present

## 2017-04-29 DIAGNOSIS — Z7982 Long term (current) use of aspirin: Secondary | ICD-10-CM | POA: Diagnosis not present

## 2017-04-29 DIAGNOSIS — E785 Hyperlipidemia, unspecified: Secondary | ICD-10-CM | POA: Diagnosis present

## 2017-04-29 DIAGNOSIS — Z531 Procedure and treatment not carried out because of patient's decision for reasons of belief and group pressure: Secondary | ICD-10-CM | POA: Diagnosis present

## 2017-04-29 DIAGNOSIS — I130 Hypertensive heart and chronic kidney disease with heart failure and stage 1 through stage 4 chronic kidney disease, or unspecified chronic kidney disease: Secondary | ICD-10-CM | POA: Diagnosis present

## 2017-04-29 HISTORY — PX: LEFT HEART CATH AND CORONARY ANGIOGRAPHY: CATH118249

## 2017-04-29 LAB — GLUCOSE, CAPILLARY
Glucose-Capillary: 372 mg/dL — ABNORMAL HIGH (ref 65–99)
Glucose-Capillary: 444 mg/dL — ABNORMAL HIGH (ref 65–99)
Glucose-Capillary: 469 mg/dL — ABNORMAL HIGH (ref 65–99)
Glucose-Capillary: 513 mg/dL (ref 65–99)
Glucose-Capillary: 515 mg/dL (ref 65–99)
Glucose-Capillary: 527 mg/dL (ref 65–99)
Glucose-Capillary: 568 mg/dL (ref 65–99)
Glucose-Capillary: >600 mg/dL (ref 65–99)

## 2017-04-29 LAB — CBC
HCT: 28.5 % — ABNORMAL LOW (ref 36.0–46.0)
Hemoglobin: 9.3 g/dL — ABNORMAL LOW (ref 12.0–15.0)
MCH: 28.4 pg (ref 26.0–34.0)
MCHC: 32.6 g/dL (ref 30.0–36.0)
MCV: 86.9 fL (ref 78.0–100.0)
Platelets: 212 10*3/uL (ref 150–400)
RBC: 3.28 MIL/uL — ABNORMAL LOW (ref 3.87–5.11)
RDW: 17 % — ABNORMAL HIGH (ref 11.5–15.5)
WBC: 5 10*3/uL (ref 4.0–10.5)

## 2017-04-29 LAB — BASIC METABOLIC PANEL
Anion gap: 12 (ref 5–15)
BUN: 64 mg/dL — ABNORMAL HIGH (ref 6–20)
CO2: 25 mmol/L (ref 22–32)
Calcium: 8.5 mg/dL — ABNORMAL LOW (ref 8.9–10.3)
Chloride: 97 mmol/L — ABNORMAL LOW (ref 101–111)
Creatinine, Ser: 2.24 mg/dL — ABNORMAL HIGH (ref 0.44–1.00)
GFR calc Af Amer: 26 mL/min — ABNORMAL LOW (ref >60)
GFR calc non Af Amer: 23 mL/min — ABNORMAL LOW (ref >60)
Glucose, Bld: 328 mg/dL — ABNORMAL HIGH (ref 65–99)
Potassium: 4 mmol/L (ref 3.5–5.1)
Sodium: 134 mmol/L — ABNORMAL LOW (ref 135–145)

## 2017-04-29 LAB — PROTIME-INR
INR: 0.96
Prothrombin Time: 12.7 seconds (ref 11.4–15.2)

## 2017-04-29 SURGERY — LEFT HEART CATH AND CORONARY ANGIOGRAPHY
Anesthesia: LOCAL

## 2017-04-29 MED ORDER — SODIUM CHLORIDE 0.9 % IV SOLN: 250.0000 mL | INTRAVENOUS | Status: DC | PRN

## 2017-04-29 MED ORDER — HEPARIN (PORCINE) IN NACL 2-0.9 UNIT/ML-% IJ SOLN
INTRAMUSCULAR | Status: AC
  Filled 2017-04-29: qty 1000

## 2017-04-29 MED ORDER — SODIUM CHLORIDE 0.9 % IV SOLN
INTRAVENOUS | Status: DC
  Administered 2017-04-29: 5.1 [IU]/h via INTRAVENOUS
  Administered 2017-04-29: 16.4 [IU]/h via INTRAVENOUS
  Administered 2017-04-29: 13.7 [IU]/h via INTRAVENOUS
  Administered 2017-04-30: 16.6 [IU]/h via INTRAVENOUS
  Administered 2017-04-30: 1.6 [IU]/h via INTRAVENOUS
  Administered 2017-04-30: 16.3 [IU]/h via INTRAVENOUS
  Administered 2017-04-30: 13.2 [IU]/h via INTRAVENOUS
  Filled 2017-04-29 (×2): qty 1

## 2017-04-29 MED ORDER — LIDOCAINE HCL 2 % IJ SOLN
INTRAMUSCULAR | Status: AC
  Filled 2017-04-29: qty 10

## 2017-04-29 MED ORDER — LIDOCAINE HCL 2 % IJ SOLN
INTRAMUSCULAR | Status: DC | PRN
  Administered 2017-04-29: 3 mL

## 2017-04-29 MED ORDER — FENTANYL CITRATE (PF) 100 MCG/2ML IJ SOLN
INTRAMUSCULAR | Status: DC | PRN
  Administered 2017-04-29: 25 ug via INTRAVENOUS
  Administered 2017-04-29: 50 ug via INTRAVENOUS

## 2017-04-29 MED ORDER — IOPAMIDOL (ISOVUE-370) INJECTION 76%
INTRAVENOUS | Status: DC | PRN
  Administered 2017-04-29: 75 mL via INTRA_ARTERIAL

## 2017-04-29 MED ORDER — NITROGLYCERIN IN D5W 200-5 MCG/ML-% IV SOLN
25.0000 ug/min | INTRAVENOUS | Status: DC
  Administered 2017-04-29: 25 ug/min via INTRAVENOUS

## 2017-04-29 MED ORDER — SODIUM CHLORIDE 0.9% FLUSH
3.0000 mL | INTRAVENOUS | Status: DC | PRN
  Administered 2017-05-01 (×2): 3 mL via INTRAVENOUS
  Filled 2017-04-29 (×2): qty 3

## 2017-04-29 MED ORDER — SODIUM CHLORIDE 0.9 % IV SOLN
INTRAVENOUS | Status: DC
  Administered 2017-04-29: 14:00:00 via INTRAVENOUS

## 2017-04-29 MED ORDER — DEXTROSE-NACL 5-0.45 % IV SOLN
INTRAVENOUS | Status: DC
  Administered 2017-04-30: 50 mL/h via INTRAVENOUS

## 2017-04-29 MED ORDER — SODIUM CHLORIDE 0.9 % IV SOLN
INTRAVENOUS | Status: DC
  Filled 2017-04-29: qty 1

## 2017-04-29 MED ORDER — SODIUM CHLORIDE 0.9% FLUSH
3.0000 mL | Freq: Two times a day (BID) | INTRAVENOUS | Status: DC
  Administered 2017-04-30 – 2017-05-02 (×3): 3 mL via INTRAVENOUS

## 2017-04-29 MED ORDER — INSULIN ASPART 100 UNIT/ML ~~LOC~~ SOLN
25.0000 [IU] | Freq: Once | SUBCUTANEOUS | Status: AC
  Administered 2017-04-29: 25 [IU] via SUBCUTANEOUS

## 2017-04-29 MED ORDER — MIDAZOLAM HCL 2 MG/2ML IJ SOLN
INTRAMUSCULAR | Status: DC | PRN
  Administered 2017-04-29 (×2): 1 mg via INTRAVENOUS

## 2017-04-29 MED ORDER — HEPARIN SODIUM (PORCINE) 1000 UNIT/ML IJ SOLN
INTRAMUSCULAR | Status: DC | PRN
  Administered 2017-04-29: 5000 [IU] via INTRAVENOUS

## 2017-04-29 MED ORDER — FENTANYL CITRATE (PF) 100 MCG/2ML IJ SOLN
INTRAMUSCULAR | Status: AC
  Filled 2017-04-29: qty 2

## 2017-04-29 MED ORDER — DEXTROSE-NACL 5-0.45 % IV SOLN: INTRAVENOUS | Status: DC

## 2017-04-29 MED ORDER — SODIUM CHLORIDE 0.9 % IV SOLN: INTRAVENOUS | Status: DC

## 2017-04-29 MED ORDER — INSULIN ASPART 100 UNIT/ML ~~LOC~~ SOLN
8.0000 [IU] | Freq: Once | SUBCUTANEOUS | Status: AC
  Administered 2017-04-29: 8 [IU] via SUBCUTANEOUS

## 2017-04-29 MED ORDER — TICAGRELOR 90 MG PO TABS
90.0000 mg | ORAL_TABLET | ORAL | Status: AC
  Administered 2017-04-29: 90 mg via ORAL
  Filled 2017-04-29: qty 1

## 2017-04-29 MED ORDER — IOPAMIDOL (ISOVUE-370) INJECTION 76%
INTRAVENOUS | Status: AC
  Filled 2017-04-29: qty 100

## 2017-04-29 MED ORDER — HEPARIN (PORCINE) IN NACL 2-0.9 UNIT/ML-% IJ SOLN
INTRAMUSCULAR | Status: AC | PRN
  Administered 2017-04-29: 1000 mL

## 2017-04-29 MED ORDER — HEPARIN SODIUM (PORCINE) 5000 UNIT/ML IJ SOLN
5000.0000 [IU] | Freq: Three times a day (TID) | INTRAMUSCULAR | Status: DC
  Administered 2017-04-29 – 2017-04-30 (×3): 5000 [IU] via SUBCUTANEOUS
  Filled 2017-04-29 (×4): qty 1

## 2017-04-29 MED ORDER — DEXTROSE 50 % IV SOLN: 25.0000 mL | INTRAVENOUS | Status: DC | PRN

## 2017-04-29 MED ORDER — HEPARIN SODIUM (PORCINE) 1000 UNIT/ML IJ SOLN
INTRAMUSCULAR | Status: AC
  Filled 2017-04-29: qty 1

## 2017-04-29 MED ORDER — INSULIN ASPART 100 UNIT/ML ~~LOC~~ SOLN: 0.0000 [IU] | Freq: Three times a day (TID) | SUBCUTANEOUS | Status: DC

## 2017-04-29 MED ORDER — INSULIN REGULAR BOLUS VIA INFUSION: 0.0000 [IU] | Freq: Three times a day (TID) | INTRAVENOUS | Status: DC

## 2017-04-29 MED ORDER — VERAPAMIL HCL 2.5 MG/ML IV SOLN
INTRAVENOUS | Status: AC
  Filled 2017-04-29: qty 2

## 2017-04-29 MED ORDER — ISOSORBIDE MONONITRATE ER 60 MG PO TB24
90.0000 mg | ORAL_TABLET | Freq: Every day | ORAL | Status: DC
  Administered 2017-04-29 – 2017-05-02 (×4): 90 mg via ORAL
  Filled 2017-04-29 (×4): qty 1

## 2017-04-29 MED ORDER — MIDAZOLAM HCL 2 MG/2ML IJ SOLN
INTRAMUSCULAR | Status: AC
  Filled 2017-04-29: qty 2

## 2017-04-29 MED ORDER — VERAPAMIL HCL 2.5 MG/ML IV SOLN
INTRAVENOUS | Status: DC | PRN
  Administered 2017-04-29: 14:00:00 via INTRA_ARTERIAL

## 2017-04-29 MED ORDER — INSULIN REGULAR BOLUS VIA INFUSION
0.0000 [IU] | Freq: Three times a day (TID) | INTRAVENOUS | Status: DC
  Filled 2017-04-29: qty 10

## 2017-04-29 MED ORDER — SODIUM CHLORIDE 0.9 % IV SOLN
INTRAVENOUS | Status: DC
  Administered 2017-04-29 – 2017-04-30 (×2): via INTRAVENOUS

## 2017-04-29 SURGICAL SUPPLY — 11 items
CATH INFINITI JR4 5F (CATHETERS) ×1 IMPLANT
CATH OPTITORQUE TIG 4.0 5F (CATHETERS) ×2 IMPLANT
DEVICE RAD COMP TR BAND LRG (VASCULAR PRODUCTS) ×1 IMPLANT
GLIDESHEATH SLEND A-KIT 6F 22G (SHEATH) ×2 IMPLANT
GUIDEWIRE INQWIRE 1.5J.035X260 (WIRE) IMPLANT
INQWIRE 1.5J .035X260CM (WIRE) ×2
KIT HEART LEFT (KITS) ×2 IMPLANT
PACK CARDIAC CATHETERIZATION (CUSTOM PROCEDURE TRAY) ×2 IMPLANT
TRANSDUCER W/STOPCOCK (MISCELLANEOUS) ×2 IMPLANT
TUBING CIL FLEX 10 FLL-RA (TUBING) ×2 IMPLANT
WIRE HI TORQ VERSACORE-J 145CM (WIRE) ×1 IMPLANT

## 2017-04-29 NOTE — Progress Notes (Signed)
Pt continued with intermittent 10/10 chest pain after SL Nitro d/c'ed. Nitro patch also ineffective. Diagnostics negative so far. MD paged. Orders received for Nitro drip. Pt for cath this AM. Will continue to monitor.

## 2017-04-29 NOTE — Progress Notes (Signed)
Inpatient Diabetes Program Recommendations  AACE/ADA: New Consensus Statement on Inpatient Glycemic Control (2015)  Target Ranges:  Prepandial:   less than 140 mg/dL      Peak postprandial:   less than 180 mg/dL (1-2 hours)      Critically ill patients:  140 - 180 mg/dL   Lab Results  Component Value Date   GLUCAP 372 (H) 04/29/2017   HGBA1C 9.8 02/12/2017    Review of Glycemic Control Results for [name], [name] (MRN [id number]) as of 04/29/2017 09:13  Ref. Range 04/28/2017 08:26 04/28/2017 12:43 04/28/2017 16:23 04/28/2017 20:59 04/29/2017 07:11  Glucose-Capillary Latest Ref Range: 65 - 99 mg/dL 194 (H) 200 (H) 193 (H) 352 (H) 372 (H)   Diabetes history: DM2 Outpatient Diabetes medications: Lantus 30 units bid + Novolog 0-12 units tid meal coverage + Victoza (note states pt. Not taking) Current orders for Inpatient glycemic control: Lantus 20 units bid + Novolog sensitive correction tid  Inpatient Diabetes Program Recommendations:    Noted patient for procedure today. -Increase Novolog correction to q 4 hrs while NPO -Increase Lantus to 30 units bid -Add Novolog 5 units tid meal coverage when eating 50% meals  Thank you, Bethena Roys E. Fletcher Ostermiller, RN, MSN, CDE  Diabetes Coordinator Inpatient Glycemic Control Team Team Pager [phone] (8am-5pm) 04/29/2017 9:26 AM

## 2017-04-29 NOTE — Progress Notes (Signed)
Pt scheduled for cath but time still unknown. Pharmacy called by this RN to inquire if Prednisone had to be given exactly 13 hours, 7 hours, and 1 hour prior to cath. Pharmacy instructed that Prednisone times did not have to be exact, but that the patient needed to receive all 3 doses (6 hours apart) before procedure. Prednisone started at 00:34. 2nd dose will be given at 06:30, with the last dose to be re-timed for 12:30. Benadryl to be given 1 hour before cath once cath time confirmed. Will pass on to day shift RN and continue to monitor.

## 2017-04-29 NOTE — Progress Notes (Signed)
Recheck of CBG is >600. MD text paged.

## 2017-04-29 NOTE — Progress Notes (Signed)
PROGRESS NOTE  [name] VOH:[id number] DOB: 1956/06/23 DOA: 04/28/2017 PCP: Arnoldo Morale, MD   LOS: 0 days   Brief Narrative / Interim history: 61 y.o. female with history of CAD, CHF, s/p LAD stenting in March, 2018 with DAPT following. Pt presented early yesterday AM with exertional dyspena and chest pain initially refractory to NTG. Workup concerning for unstable angina, scheduled for cath today.   Assessment & Plan: Principal Problem:   Chest pain with moderate risk of acute coronary syndrome Active Problems:   Diabetes mellitus type 2, uncontrolled, with complications (HCC)   Anemia in stage 3 chronic kidney disease (HCC)   Essential hypertension   Obesity   CKD (chronic kidney disease) stage 4, GFR 15-29 ml/min (HCC)   OSA (obstructive sleep apnea)   Hep C w/o coma, chronic (HCC)   History of non-ST elevation myocardial infarction (NSTEMI)   Chronic gout due to renal impairment of multiple sites without tophus   Chronic combined systolic and diastolic CHF (congestive heart failure) (HCC)   Acute on chronic combined systolic and diastolic CHF, NYHA class 4 ([place])   1. Chest pain: Chest pain and exertional dyspnea in a patient with hx of CAD concerning for unstable angina. Chest pain resolved with IV NTG, pt remains somewhat SOB with exertion. Cardiology following, pt scheduled for cardiac cath today. Continuing NTG and heparin  2. Chronic combined systolic and diastolic CHF: CXR shows no evidence of pulmonary vascular congestion. BNP normal, no signs of fluid overload on exam. Doubt this presentation is related to CHF. Will continue Coreg. Lasix held today in anticipation of cardiac cath.  3. CKD: Cr elevated at 2.2, but appears to be at baseline. Concerned for worsening renal function with cath today, will monitor closely. Lasix held today for cath.   4. T2DM: Poorly controlled. Last glucose 444. Added additional 8 units insulin. Continuing sliding scale.  5. HTN:  Currently controlled. Will continue amlodipine and carvedilol.   6. Anemia: Hgb 9.3, currently at baseline. Will monitor.   DVT prophylaxis: heparin q 8 hours  Code Status: full code Family Communication: none Disposition Plan: currently admitted, pending cath results today  Consultants:   Cardiology   Procedures:   Left heart cath planned for today  Antimicrobials:  none   Subjective: Pt reports that chest pain is better since starting IV NTG, but DOE remains. Pt also reported headache, probably related to NTG.    Objective: Vitals:   04/28/17 2345 04/29/17 0040 04/29/17 0441 04/29/17 0700  BP: (!) 130/46  139/67 139/66  Pulse: 84 82 82 86  Resp: 20  20   Temp: 98.2 F (36.8 C)  98.4 F (36.9 C) 98.5 F (36.9 C)  TempSrc: Oral   Oral  SpO2: 96%  94% 98%  Weight:   135.6 kg (299 lb)   Height:        Intake/Output Summary (Last 24 hours) at 04/29/17 1201 Last data filed at 04/29/17 0509  Gross per 24 hour  Intake          1110.63 ml  Output             1300 ml  Net          -189.37 ml   Filed Weights   04/28/17 0900 04/29/17 0441  Weight: (!) 136.3 kg (300 lb 8 oz) 135.6 kg (299 lb)    Examination:  Constitutional: NAD Eyes: PERRL, lids and conjunctivae normal ENMT: Mucous membranes are moist. No oropharyngeal exudates Neck: normal,  supple, no masses, no thyromegaly Respiratory: clear to auscultation bilaterally, no wheezing, no crackles. Normal respiratory effort. No accessory muscle use.  Cardiovascular: Regular rate and rhythm, no murmurs / rubs / gallops. No LE edema. 2+ pedal pulses. No carotid bruits.  Abdomen: no tenderness. Bowel sounds positive.  Musculoskeletal: no clubbing / cyanosis. No joint deformity upper and lower extremities. No contractures. Normal muscle tone.  Skin: no rashes, lesions, ulcers. No induration Neurologic: CN 2-12 grossly intact. Strength 5/5 in all 4.  Psychiatric: Normal judgment and insight. Alert and oriented x 3.  Normal mood.    Data Reviewed: I have independently reviewed following labs and imaging studies   CBC:  Recent Labs Lab 04/28/17 0003 04/28/17 0540 04/29/17 0411  WBC 6.5 6.0 5.0  HGB 10.0* 9.6* 9.3*  HCT 30.6* 29.7* 28.5*  MCV 85.7 86.3 86.9  PLT 227 202 166   Basic Metabolic Panel:  Recent Labs Lab 04/28/17 0003 04/28/17 0540 04/29/17 0411  NA 136  --  134*  K 3.7  --  4.0  CL 99*  --  97*  CO2 26  --  25  GLUCOSE 198*  --  328*  BUN 69*  --  64*  CREATININE 2.19* 2.14* 2.24*  CALCIUM 8.7*  --  8.5*   GFR: Estimated Creatinine Clearance: 39.5 mL/min (A) (by C-G formula based on SCr of 2.24 mg/dL (H)). Liver Function Tests: No results for input(s): AST, ALT, ALKPHOS, BILITOT, PROT, ALBUMIN in the last 168 hours. No results for input(s): LIPASE, AMYLASE in the last 168 hours. No results for input(s): AMMONIA in the last 168 hours. Coagulation Profile:  Recent Labs Lab 04/29/17 0411  INR 0.96   Cardiac Enzymes:  Recent Labs Lab 04/28/17 0540 04/28/17 1031 04/28/17 1822  TROPONINI <0.03 <0.03 <0.03   BNP (last 3 results)  Recent Labs  11/10/16 1418  PROBNP 202   HbA1C: No results for input(s): HGBA1C in the last 72 hours. CBG:  Recent Labs Lab 04/28/17 1243 04/28/17 1623 04/28/17 2059 04/29/17 0711 04/29/17 1153  GLUCAP 200* 193* 352* 372* 444*   Lipid Profile: No results for input(s): CHOL, HDL, LDLCALC, TRIG, CHOLHDL, LDLDIRECT in the last 72 hours. Thyroid Function Tests: No results for input(s): TSH, T4TOTAL, FREET4, T3FREE, THYROIDAB in the last 72 hours. Anemia Panel: No results for input(s): VITAMINB12, FOLATE, FERRITIN, TIBC, IRON, RETICCTPCT in the last 72 hours. Urine analysis:    Component Value Date/Time   COLORURINE YELLOW 12/12/2016 1948   APPEARANCEUR HAZY (A) 12/12/2016 1948   LABSPEC 1.011 12/12/2016 1948   PHURINE 5.0 12/12/2016 1948   GLUCOSEU >=500 (A) 12/12/2016 1948   HGBUR NEGATIVE 12/12/2016 1948    BILIRUBINUR neg 12/17/2016 1538   KETONESUR NEGATIVE 12/12/2016 1948   PROTEINUR >=300 12/17/2016 1538   PROTEINUR 100 (A) 12/12/2016 1948   UROBILINOGEN 0.2 12/17/2016 1538   UROBILINOGEN 0.2 05/29/2010 2100   NITRITE neg 12/17/2016 1538   NITRITE NEGATIVE 12/12/2016 1948   LEUKOCYTESUR Negative 12/17/2016 1538   Sepsis Labs: Invalid input(s): PROCALCITONIN, LACTICIDVEN  No results found for this or any previous visit (from the past 240 hour(s)).    Radiology Studies: Dg Chest 2 View  Result Date: 04/28/2017 CLINICAL DATA:  Shortness of breath on exertion today. Left-sided chest pain. History of CHF, diabetes, hypertension. EXAM: CHEST  2 VIEW COMPARISON:  03/17/2017 FINDINGS: The heart size and mediastinal contours are within normal limits. Both lungs are clear. The visualized skeletal structures are unremarkable. IMPRESSION: No active cardiopulmonary disease.  Electronically Signed   By: Lucienne Capers M.D.   On: 04/28/2017 00:33     Scheduled Meds: . allopurinol  100 mg Oral Daily  . amLODipine  10 mg Oral Daily  . aspirin EC  81 mg Oral Daily  . atorvastatin  80 mg Oral q1800  . carvedilol  25 mg Oral BID WC  . colchicine  0.6 mg Oral Daily  . diphenhydrAMINE  50 mg Oral Once   Or  . diphenhydrAMINE  50 mg Intravenous Once  . ferrous sulfate  325 mg Oral BID WC  . [START ON 04/30/2017] furosemide  40 mg Oral QPM  . [START ON 04/30/2017] furosemide  80 mg Oral q morning - 10a  . heparin  5,000 Units Subcutaneous Q8H  . hydrALAZINE  50 mg Oral TID  . insulin aspart  0-9 Units Subcutaneous TID WC  . insulin aspart  8 Units Subcutaneous Once  . insulin glargine  20 Units Subcutaneous BID  . mometasone-formoterol  2 puff Inhalation BID  . pantoprazole  40 mg Oral Daily  . pregabalin  100 mg Oral TID  . sodium chloride flush  3 mL Intravenous Q12H  . ticagrelor  90 mg Oral BID  . valACYclovir  500 mg Oral Daily   Continuous Infusions: . sodium chloride 75 mL/hr at  04/29/17 1155  . nitroGLYCERIN 25 mcg/min (04/28/17 2214)       Time spent: 30 minutes    Audery Amel, PA-S [facility] Pager [phone]  If 7PM-7AM, please contact night-coverage [email or web address] Password [email or web address] 04/29/2017, 12:01 PM   [email or web address] @

## 2017-04-29 NOTE — Progress Notes (Signed)
Prior authorization for Victoza denied. Must fail Byetta and Bydureon first. Will forward to PCP

## 2017-04-29 NOTE — Progress Notes (Signed)
Progress Note  Patient Name: [name] Date of Encounter: 04/29/2017  Primary Cardiologist: Burt Knack  Subjective   No chest pain with nitro gtt. No shortness of breath  Inpatient Medications    Scheduled Meds: . allopurinol  100 mg Oral Daily  . amLODipine  10 mg Oral Daily  . aspirin EC  81 mg Oral Daily  . atorvastatin  80 mg Oral q1800  . carvedilol  25 mg Oral BID WC  . colchicine  0.6 mg Oral Daily  . diphenhydrAMINE  50 mg Oral Once   Or  . diphenhydrAMINE  50 mg Intravenous Once  . ferrous sulfate  325 mg Oral BID WC  . [START ON 04/30/2017] furosemide  40 mg Oral QPM  . [START ON 04/30/2017] furosemide  80 mg Oral q morning - 10a  . heparin  5,000 Units Subcutaneous Q8H  . hydrALAZINE  50 mg Oral TID  . insulin aspart  0-9 Units Subcutaneous TID WC  . insulin glargine  20 Units Subcutaneous BID  . mometasone-formoterol  2 puff Inhalation BID  . pantoprazole  40 mg Oral Daily  . pregabalin  100 mg Oral TID  . sodium chloride flush  3 mL Intravenous Q12H  . ticagrelor  90 mg Oral BID  . valACYclovir  500 mg Oral Daily   Continuous Infusions: . sodium chloride 75 mL/hr at 04/28/17 2214  . nitroGLYCERIN 25 mcg/min (04/28/17 2214)   PRN Meds: acetaminophen, albuterol, fluticasone, hydrOXYzine, ketotifen, ondansetron (ZOFRAN) IV   Vital Signs    Vitals:   04/28/17 2345 04/29/17 0040 04/29/17 0441 04/29/17 0700  BP: (!) 130/46  139/67 139/66  Pulse: 84 82 82 86  Resp: 20  20   Temp: 98.2 F (36.8 C)  98.4 F (36.9 C) 98.5 F (36.9 C)  TempSrc: Oral   Oral  SpO2: 96%  94% 98%  Weight:   299 lb (135.6 kg)   Height:        Intake/Output Summary (Last 24 hours) at 04/29/17 1042 Last data filed at 04/29/17 0509  Gross per 24 hour  Intake          1170.63 ml  Output             1800 ml  Net          -629.37 ml   Filed Weights   04/28/17 0900 04/29/17 0441  Weight: (!) 300 lb 8 oz (136.3 kg) 299 lb (135.6 kg)    Telemetry    SR - Personally  Reviewed  ECG    N/a - Personally Reviewed  Physical Exam   General: Obese AA female appearing in no acute distress. Head: Normocephalic, atraumatic.  Neck: Supple without bruits, JVD. Lungs:  Resp regular and unlabored, CTA. Heart: RRR, S1, S2, no S3, S4, soft systolic murmur; no rub. Abdomen: Soft, non-tender, non-distended with normoactive bowel sounds. No hepatomegaly. No rebound/guarding. No obvious abdominal masses. Extremities: No clubbing, cyanosis, mild bilateral LE edema. Distal pedal pulses are 2+ bilaterally. Neuro: Alert and oriented X 3. Moves all extremities spontaneously. Psych: Normal affect.  Labs    Chemistry Recent Labs Lab 04/28/17 0003 04/28/17 0540 04/29/17 0411  NA 136  --  134*  K 3.7  --  4.0  CL 99*  --  97*  CO2 26  --  25  GLUCOSE 198*  --  328*  BUN 69*  --  64*  CREATININE 2.19* 2.14* 2.24*  CALCIUM 8.7*  --  8.5*  GFRNONAA 23* 24* 23*  GFRAA 27* 28* 26*  ANIONGAP 11  --  12     Hematology Recent Labs Lab 04/28/17 0003 04/28/17 0540 04/29/17 0411  WBC 6.5 6.0 5.0  RBC 3.57* 3.44* 3.28*  HGB 10.0* 9.6* 9.3*  HCT 30.6* 29.7* 28.5*  MCV 85.7 86.3 86.9  MCH 28.0 27.9 28.4  MCHC 32.7 32.3 32.6  RDW 17.5* 17.2* 17.0*  PLT 227 202 212    Cardiac Enzymes Recent Labs Lab 04/28/17 0540 04/28/17 1031 04/28/17 1822  TROPONINI <0.03 <0.03 <0.03    Recent Labs Lab 04/28/17 0013  TROPIPOC 0.01     BNP Recent Labs Lab 04/28/17 0003  BNP 38.6     DDimer No results for input(s): DDIMER in the last 168 hours.    Radiology    Dg Chest 2 View  Result Date: 04/28/2017 CLINICAL DATA:  Shortness of breath on exertion today. Left-sided chest pain. History of CHF, diabetes, hypertension. EXAM: CHEST  2 VIEW COMPARISON:  03/17/2017 FINDINGS: The heart size and mediastinal contours are within normal limits. Both lungs are clear. The visualized skeletal structures are unremarkable. IMPRESSION: No active cardiopulmonary disease.  Electronically Signed   By: Lucienne Capers M.D.   On: 04/28/2017 00:33    Cardiac Studies   N/a  Patient Profile     61 y.o. female with PMH of  IDDM, CRI-4, HTN, HLD, OSA, Hep C, and CAD s/p DES to pLAD who presented with chest pain.   Assessment & Plan    1. Chest pain: Reports pain is similar to that which she experienced with previous PCI back in March but not as intense. Trop negative x3. Continued to have chest pain and was placed on IV nitro with resolution of pain. Remains on DAPT with ASA/Brilinta.  -- planned for cardiac cath today. Cr 2.2 today but appears baseline has been around 2. Discussed the risks of worsening renal function with patient. States she saw nephrology as an outpt and told that she will likely need HD in the future.   2. Chronic combined HF: No s/s of volume overload on exam.   3. HTN: stable  4. CKD stage IV: Seen by nephrology as an outpatient, told she is likely need HD in the future. Cr 2.2 today, was 3.2 back in September. Home lasix held in anticipation of cath today.   5. Anemia: Hgb 9.3, baseline around 9-10.   6. IDDM: CBGs are uncontrolled, on multiple therapies. Seen by diabetes coordinator.   Signed, Reino Bellis, NP  04/29/2017, 10:42 AM  Pager # [phone]   Patient seen, examined. Available data reviewed. Agree with findings, assessment, and plan as outlined by Reino Bellis, NP-C. on my exam, the patient is a pleasant obese woman in no distress. JVP is normal. Lungs are clear bilaterally. Heart is regular rate and rhythm with a soft systolic ejection murmur at the right upper sternal border. There is no S3. Abdomen is soft and obese, nontender. Extremities are without edema. All available laboratory data is reviewed. The patient's creatinine is stable and her troponins are negative. However, she has multiple poorly controlled risk factors, history of LAD stenting, and recurrent chest pain requiring hospital admission. I agree with  repeat coronary angiography to evaluate stent patency and/or new occlusive disease. Risks and indications of the procedure have been reviewed with the patient. Hopefully the procedure can be done with limited contrast. If she requires intervention, it may need to be staged because of her kidney function.  Sherren Mocha, M.D. 04/29/2017 12:07 PM    For questions or updates, please contact [place] Please consult [email or web address] for contact info under Cardiology/STEMI. Daytime calls, contact the Day Call APP (6a-8a) or assigned team (Teams A-D) provider (7:30a - 5p). All other daytime calls (7:30-5p), contact the Card Master @ [phone].   Nighttime calls, contact the assigned APP (5p-8p) or MD (6:30p-8p). Overnight calls (8p-6a), contact the on call Fellow @ [phone].

## 2017-04-29 NOTE — Progress Notes (Signed)
Per Dr. Saunders Revel, decrease nitro drip. If pt remains CP free for 2 hours, discontinue drip.

## 2017-04-29 NOTE — Progress Notes (Addendum)
Patient had CBG of 513 this evening. MD notified. Orders given. MD made aware that telemetry floor is unable to administer glucostabilizer per nursing director.

## 2017-04-29 NOTE — H&P (View-Only) (Signed)
Progress Note  Patient Name: [name] Date of Encounter: 04/29/2017  Primary Cardiologist: Burt Knack  Subjective   No chest pain with nitro gtt. No shortness of breath  Inpatient Medications    Scheduled Meds: . allopurinol  100 mg Oral Daily  . amLODipine  10 mg Oral Daily  . aspirin EC  81 mg Oral Daily  . atorvastatin  80 mg Oral q1800  . carvedilol  25 mg Oral BID WC  . colchicine  0.6 mg Oral Daily  . diphenhydrAMINE  50 mg Oral Once   Or  . diphenhydrAMINE  50 mg Intravenous Once  . ferrous sulfate  325 mg Oral BID WC  . [START ON 04/30/2017] furosemide  40 mg Oral QPM  . [START ON 04/30/2017] furosemide  80 mg Oral q morning - 10a  . heparin  5,000 Units Subcutaneous Q8H  . hydrALAZINE  50 mg Oral TID  . insulin aspart  0-9 Units Subcutaneous TID WC  . insulin glargine  20 Units Subcutaneous BID  . mometasone-formoterol  2 puff Inhalation BID  . pantoprazole  40 mg Oral Daily  . pregabalin  100 mg Oral TID  . sodium chloride flush  3 mL Intravenous Q12H  . ticagrelor  90 mg Oral BID  . valACYclovir  500 mg Oral Daily   Continuous Infusions: . sodium chloride 75 mL/hr at 04/28/17 2214  . nitroGLYCERIN 25 mcg/min (04/28/17 2214)   PRN Meds: acetaminophen, albuterol, fluticasone, hydrOXYzine, ketotifen, ondansetron (ZOFRAN) IV   Vital Signs    Vitals:   04/28/17 2345 04/29/17 0040 04/29/17 0441 04/29/17 0700  BP: (!) 130/46  139/67 139/66  Pulse: 84 82 82 86  Resp: 20  20   Temp: 98.2 F (36.8 C)  98.4 F (36.9 C) 98.5 F (36.9 C)  TempSrc: Oral   Oral  SpO2: 96%  94% 98%  Weight:   299 lb (135.6 kg)   Height:        Intake/Output Summary (Last 24 hours) at 04/29/17 1042 Last data filed at 04/29/17 0509  Gross per 24 hour  Intake          1170.63 ml  Output             1800 ml  Net          -629.37 ml   Filed Weights   04/28/17 0900 04/29/17 0441  Weight: (!) 300 lb 8 oz (136.3 kg) 299 lb (135.6 kg)    Telemetry    SR - Personally  Reviewed  ECG    N/a - Personally Reviewed  Physical Exam   General: Obese AA female appearing in no acute distress. Head: Normocephalic, atraumatic.  Neck: Supple without bruits, JVD. Lungs:  Resp regular and unlabored, CTA. Heart: RRR, S1, S2, no S3, S4, soft systolic murmur; no rub. Abdomen: Soft, non-tender, non-distended with normoactive bowel sounds. No hepatomegaly. No rebound/guarding. No obvious abdominal masses. Extremities: No clubbing, cyanosis, mild bilateral LE edema. Distal pedal pulses are 2+ bilaterally. Neuro: Alert and oriented X 3. Moves all extremities spontaneously. Psych: Normal affect.  Labs    Chemistry Recent Labs Lab 04/28/17 0003 04/28/17 0540 04/29/17 0411  NA 136  --  134*  K 3.7  --  4.0  CL 99*  --  97*  CO2 26  --  25  GLUCOSE 198*  --  328*  BUN 69*  --  64*  CREATININE 2.19* 2.14* 2.24*  CALCIUM 8.7*  --  8.5*  GFRNONAA 23* 24* 23*  GFRAA 27* 28* 26*  ANIONGAP 11  --  12     Hematology Recent Labs Lab 04/28/17 0003 04/28/17 0540 04/29/17 0411  WBC 6.5 6.0 5.0  RBC 3.57* 3.44* 3.28*  HGB 10.0* 9.6* 9.3*  HCT 30.6* 29.7* 28.5*  MCV 85.7 86.3 86.9  MCH 28.0 27.9 28.4  MCHC 32.7 32.3 32.6  RDW 17.5* 17.2* 17.0*  PLT 227 202 212    Cardiac Enzymes Recent Labs Lab 04/28/17 0540 04/28/17 1031 04/28/17 1822  TROPONINI <0.03 <0.03 <0.03    Recent Labs Lab 04/28/17 0013  TROPIPOC 0.01     BNP Recent Labs Lab 04/28/17 0003  BNP 38.6     DDimer No results for input(s): DDIMER in the last 168 hours.    Radiology    Dg Chest 2 View  Result Date: 04/28/2017 CLINICAL DATA:  Shortness of breath on exertion today. Left-sided chest pain. History of CHF, diabetes, hypertension. EXAM: CHEST  2 VIEW COMPARISON:  03/17/2017 FINDINGS: The heart size and mediastinal contours are within normal limits. Both lungs are clear. The visualized skeletal structures are unremarkable. IMPRESSION: No active cardiopulmonary disease.  Electronically Signed   By: Lucienne Capers M.D.   On: 04/28/2017 00:33    Cardiac Studies   N/a  Patient Profile     61 y.o. female with PMH of  IDDM, CRI-4, HTN, HLD, OSA, Hep C, and CAD s/p DES to pLAD who presented with chest pain.   Assessment & Plan    1. Chest pain: Reports pain is similar to that which she experienced with previous PCI back in March but not as intense. Trop negative x3. Continued to have chest pain and was placed on IV nitro with resolution of pain. Remains on DAPT with ASA/Brilinta.  -- planned for cardiac cath today. Cr 2.2 today but appears baseline has been around 2. Discussed the risks of worsening renal function with patient. States she saw nephrology as an outpt and told that she will likely need HD in the future.   2. Chronic combined HF: No s/s of volume overload on exam.   3. HTN: stable  4. CKD stage IV: Seen by nephrology as an outpatient, told she is likely need HD in the future. Cr 2.2 today, was 3.2 back in September. Home lasix held in anticipation of cath today.   5. Anemia: Hgb 9.3, baseline around 9-10.   6. IDDM: CBGs are uncontrolled, on multiple therapies. Seen by diabetes coordinator.   Signed, Reino Bellis, NP  04/29/2017, 10:42 AM  Pager # [phone]   Patient seen, examined. Available data reviewed. Agree with findings, assessment, and plan as outlined by Reino Bellis, NP-C. on my exam, the patient is a pleasant obese woman in no distress. JVP is normal. Lungs are clear bilaterally. Heart is regular rate and rhythm with a soft systolic ejection murmur at the right upper sternal border. There is no S3. Abdomen is soft and obese, nontender. Extremities are without edema. All available laboratory data is reviewed. The patient's creatinine is stable and her troponins are negative. However, she has multiple poorly controlled risk factors, history of LAD stenting, and recurrent chest pain requiring hospital admission. I agree with  repeat coronary angiography to evaluate stent patency and/or new occlusive disease. Risks and indications of the procedure have been reviewed with the patient. Hopefully the procedure can be done with limited contrast. If she requires intervention, it may need to be staged because of her kidney function.  Sherren Mocha, M.D. 04/29/2017 12:07 PM    For questions or updates, please contact [place] Please consult [email or web address] for contact info under Cardiology/STEMI. Daytime calls, contact the Day Call APP (6a-8a) or assigned team (Teams A-D) provider (7:30a - 5p). All other daytime calls (7:30-5p), contact the Card Master @ [phone].   Nighttime calls, contact the assigned APP (5p-8p) or MD (6:30p-8p). Overnight calls (8p-6a), contact the on call Fellow @ [phone].

## 2017-04-29 NOTE — Interval H&P Note (Signed)
History and Physical Interval Note:  04/29/2017 1:07 PM  [name]  has presented today for cardiac catheterization, with the diagnosis of unstable angina. The various methods of treatment have been discussed with the patient and family. After consideration of risks, benefits and other options for treatment, the patient has consented to  Procedure(s): LEFT HEART CATH AND CORONARY ANGIOGRAPHY (N/A) as a surgical intervention .  The patient's history has been reviewed, patient examined, no change in status, stable for surgery.  I have reviewed the patient's chart and labs.  Questions were answered to the patient's satisfaction.    Cath Lab Visit (complete for each Cath Lab visit)  Clinical Evaluation Leading to the Procedure:   ACS: Yes.    Non-ACS:  N/A  [name]

## 2017-04-30 ENCOUNTER — Other Ambulatory Visit: Payer: Self-pay | Admitting: Family Medicine

## 2017-04-30 ENCOUNTER — Inpatient Hospital Stay (HOSPITAL_COMMUNITY): Payer: Medicaid Other

## 2017-04-30 LAB — GLUCOSE, CAPILLARY
Glucose-Capillary: 219 mg/dL — ABNORMAL HIGH (ref 65–99)
Glucose-Capillary: 244 mg/dL — ABNORMAL HIGH (ref 65–99)
Glucose-Capillary: 252 mg/dL — ABNORMAL HIGH (ref 65–99)
Glucose-Capillary: 259 mg/dL — ABNORMAL HIGH (ref 65–99)
Glucose-Capillary: 302 mg/dL — ABNORMAL HIGH (ref 65–99)
Glucose-Capillary: 309 mg/dL — ABNORMAL HIGH (ref 65–99)
Glucose-Capillary: 318 mg/dL — ABNORMAL HIGH (ref 65–99)
Glucose-Capillary: 319 mg/dL — ABNORMAL HIGH (ref 65–99)
Glucose-Capillary: 386 mg/dL — ABNORMAL HIGH (ref 65–99)
Glucose-Capillary: 389 mg/dL — ABNORMAL HIGH (ref 65–99)
Glucose-Capillary: 391 mg/dL — ABNORMAL HIGH (ref 65–99)

## 2017-04-30 LAB — BASIC METABOLIC PANEL
Anion gap: 11 (ref 5–15)
BUN: 71 mg/dL — ABNORMAL HIGH (ref 6–20)
CO2: 21 mmol/L — ABNORMAL LOW (ref 22–32)
Calcium: 8.2 mg/dL — ABNORMAL LOW (ref 8.9–10.3)
Chloride: 98 mmol/L — ABNORMAL LOW (ref 101–111)
Creatinine, Ser: 2.92 mg/dL — ABNORMAL HIGH (ref 0.44–1.00)
GFR calc Af Amer: 19 mL/min — ABNORMAL LOW (ref >60)
GFR calc non Af Amer: 17 mL/min — ABNORMAL LOW (ref >60)
Glucose, Bld: 284 mg/dL — ABNORMAL HIGH (ref 65–99)
Potassium: 4.1 mmol/L (ref 3.5–5.1)
Sodium: 130 mmol/L — ABNORMAL LOW (ref 135–145)

## 2017-04-30 MED ORDER — INSULIN ASPART 100 UNIT/ML ~~LOC~~ SOLN
18.0000 [IU] | Freq: Three times a day (TID) | SUBCUTANEOUS | Status: DC
  Administered 2017-04-30 – 2017-05-01 (×3): 18 [IU] via SUBCUTANEOUS

## 2017-04-30 MED ORDER — INSULIN GLARGINE 100 UNIT/ML ~~LOC~~ SOLN
25.0000 [IU] | Freq: Two times a day (BID) | SUBCUTANEOUS | Status: DC
  Administered 2017-04-30 (×2): 25 [IU] via SUBCUTANEOUS
  Filled 2017-04-30 (×3): qty 0.25

## 2017-04-30 MED ORDER — INSULIN ASPART 100 UNIT/ML ~~LOC~~ SOLN
0.0000 [IU] | Freq: Every day | SUBCUTANEOUS | Status: DC
  Administered 2017-04-30: 3 [IU] via SUBCUTANEOUS

## 2017-04-30 MED ORDER — HYDROCODONE-ACETAMINOPHEN 7.5-325 MG PO TABS
1.0000 | ORAL_TABLET | Freq: Four times a day (QID) | ORAL | Status: DC | PRN
  Administered 2017-04-30: 1 via ORAL
  Filled 2017-04-30: qty 1

## 2017-04-30 MED ORDER — EXENATIDE 10 MCG/0.04ML ~~LOC~~ SOPN: 10.0000 ug | PEN_INJECTOR | Freq: Two times a day (BID) | SUBCUTANEOUS | 1 refills | Status: DC

## 2017-04-30 MED ORDER — INSULIN REGULAR BOLUS VIA INFUSION
0.0000 [IU] | Freq: Three times a day (TID) | INTRAVENOUS | Status: DC
  Filled 2017-04-30: qty 10

## 2017-04-30 MED ORDER — INSULIN ASPART 100 UNIT/ML ~~LOC~~ SOLN
0.0000 [IU] | Freq: Three times a day (TID) | SUBCUTANEOUS | Status: DC
  Administered 2017-04-30 (×2): 7 [IU] via SUBCUTANEOUS
  Administered 2017-05-01 (×2): 2 [IU] via SUBCUTANEOUS
  Administered 2017-05-01: 7 [IU] via SUBCUTANEOUS
  Administered 2017-05-02: 5 [IU] via SUBCUTANEOUS
  Administered 2017-05-02: 2 [IU] via SUBCUTANEOUS

## 2017-04-30 NOTE — Progress Notes (Signed)
Inpatient Diabetes Program Recommendations  AACE/ADA: New Consensus Statement on Inpatient Glycemic Control (2015)  Target Ranges:  Prepandial:   less than 140 mg/dL      Peak postprandial:   less than 180 mg/dL (1-2 hours)      Critically ill patients:  140 - 180 mg/dL   Lab Results  Component Value Date   GLUCAP 219 (H) 04/30/2017   HGBA1C 9.8 02/12/2017    Review of Glycemic ControlResults for LULIE, HURD (MRN [id number]) as of 04/30/2017 08:57  Ref. Range 04/30/2017 03:31 04/30/2017 04:40 04/30/2017 05:55 04/30/2017 07:20 04/30/2017 08:01  Glucose-Capillary Latest Ref Range: 65 - 99 mg/dL 386 (H) 302 (H) 318 (H) 244 (H) 219 (H)   Diabetes history: Type 2 diabetes Outpatient Diabetes medications: Lantus 30 units bid, Novolog 0-12 units tid with meals, Victoza 1.8 mg daily, Novolog 25 units tid with meals Current orders for Inpatient glycemic control:  Lantus 20 units bid, IV insulin-Glucostabilizer  Inpatient Diabetes Program Recommendations:   Call received from RN regarding CHO coverage with IV insulin.  Patient ate 49 grams of CHO so showed RN how to cover CHO using the IV insulin bolus feature.  Discussed with MD. Will likely transition off insulin drip this morning.    Thanks, Adah Perl, RN, BC-ADM Inpatient Diabetes Coordinator Pager [phone] (8a-5p)

## 2017-04-30 NOTE — Progress Notes (Unsigned)
I have sent a prescription for Byetta to her pharmacy due to denial of Victoza by insurance.

## 2017-04-30 NOTE — Progress Notes (Signed)
Pt c/o episodes of coughing with bloody expectorate and nasal discharge. Heparin due. On call notified.

## 2017-04-30 NOTE — Progress Notes (Signed)
Spoke with [facility] about glucostabilizer. [facility] instructs that a patient doesn't need stepdown bed if not in DKA. MD paged. Transfer orders d/c'ed. Glucostabilizer started. Will continue to monitor.

## 2017-04-30 NOTE — Progress Notes (Signed)
This Rn was called by the patient in the room, pt was blood with small clot  And complained of shortness of breath.  O2Sats 100%, was just placed on  1L for comfort. MD paged ,made aware .

## 2017-04-30 NOTE — Progress Notes (Signed)
PROGRESS NOTE  [name] FWY:[id number] DOB: 1956-03-30 DOA: 04/28/2017 PCP: Arnoldo Morale, MD   LOS: 1 day   Brief Narrative / Interim history: Patient is a 61 year old morbidly obese female with history of chronic kidney disease stage IV, coronary artery disease status post recent PCI in March 2774, diastolic CHF, who presented with chest pain.  Cardiology consulted  Assessment & Plan: Principal Problem:   Chest pain with moderate risk of acute coronary syndrome Active Problems:   Diabetes mellitus type 2, uncontrolled, with complications (HCC)   Anemia in stage 3 chronic kidney disease (HCC)   Essential hypertension   Obesity   CKD (chronic kidney disease) stage 4, GFR 15-29 ml/min (HCC)   OSA (obstructive sleep apnea)   Hep C w/o coma, chronic (HCC)   History of non-ST elevation myocardial infarction (NSTEMI)   Chronic gout due to renal impairment of multiple sites without tophus   Chronic combined systolic and diastolic CHF (congestive heart failure) (HCC)   Acute on chronic combined systolic and diastolic CHF, NYHA class 4 (HCC)   Chest pain   Coronary artery disease/chest pain -Was consulted, and patient underwent a cardiac catheterization on 10/18 which showed patent LAD stent and no change in mild to moderate left circumflex OM and diagonal disease compared to March 2018 -Continue Brilinta, aspirin -Her chest pain is now resolved  Poorly controlled diabetes mellitus -Most recent A1c was done in August 2018 and it was elevated at 9.8. -CBGs poorly controlled on 10/18 likely due to steroids which she received precath as she is allergic to contrast dye -Required insulin infusion overnight, will increase her Lantus to 25 twice daily, add 18 units of NovoLog with each meal plus sliding scale.  Chronic combined systolic and diastolic CHF -She appears euvolemic on exam, most recent 2D echo was done in August 2018, showed an EF of 12-87%, grade 1 diastolic  dysfunction -Lasix per cardiology  Chronic kidney disease stage IV -Creatinine is somewhat at baseline, creatinine jumped from 2.2-2.9 today, continue to monitor  Hypertension -Continue Norvasc, Coreg, hydralazine, Imdur  Hyperlipidemia -Continue statin   DVT prophylaxis: Heparin Code Status: Full code Family Communication: No family at bedside Disposition Plan: Home likely 1 day  Consultants:   Cardiology  Procedures:   LVH 10/18 Conclusions: 1. Widely patent mid LAD stent. 2. No significant change in mild to moderate LCx, OM, and diagonal disease compared with 09/2016. 3. Normal left ventricular filling pressure.  Recommendations: 1. Continue medical therapy and secondary prevention. I will restart isosorbide mononitrate at 90 mg daily with hope of weaning off NTG infusion later today. 2. Gentle hydration given CKD and normal LVEDP. Hold furosemide today and reassess volume status and renal function tomorrow before restarting diuretic therapy.  Antimicrobials:  None    Subjective: - no chest pain, shortness of breath, no abdominal pain, nausea or vomiting.   Objective: Vitals:   04/29/17 2113 04/30/17 0550 04/30/17 1008 04/30/17 1223  BP:  (!) 164/73 131/78 (!) 144/67  Pulse:  85 87 81  Resp:  20  18  Temp:  98.4 F (36.9 C)  97.7 F (36.5 C)  TempSrc:  Oral  Oral  SpO2: 98% 100%  99%  Weight:  (!) 138.9 kg (306 lb 3.2 oz)    Height:        Intake/Output Summary (Last 24 hours) at 04/30/17 1248 Last data filed at 04/29/17 2347  Gross per 24 hour  Intake  396.25 ml  Output             2100 ml  Net         -1703.75 ml   Filed Weights   04/28/17 0900 04/29/17 0441 04/30/17 0550  Weight: (!) 136.3 kg (300 lb 8 oz) 135.6 kg (299 lb) (!) 138.9 kg (306 lb 3.2 oz)    Examination:  Constitutional: NAD Eyes:  lids and conjunctivae normal ENMT: Mucous membranes are moist. No oropharyngeal exudates Respiratory: clear to auscultation  bilaterally, no wheezing, no crackles. Cardiovascular: Regular rate and rhythm, no murmurs / rubs / gallops. No LE edema.  Abdomen: no tenderness. Bowel sounds positive.  Skin: no rashes Neurologic: grossly non focal    Data Reviewed: I have independently reviewed following labs and imaging   CBC:  Recent Labs Lab 04/28/17 0003 04/28/17 0540 04/29/17 0411  WBC 6.5 6.0 5.0  HGB 10.0* 9.6* 9.3*  HCT 30.6* 29.7* 28.5*  MCV 85.7 86.3 86.9  PLT 227 202 161   Basic Metabolic Panel:  Recent Labs Lab 04/28/17 0003 04/28/17 0540 04/29/17 0411 04/30/17 0539  NA 136  --  134* 130*  K 3.7  --  4.0 4.1  CL 99*  --  97* 98*  CO2 26  --  25 21*  GLUCOSE 198*  --  328* 284*  BUN 69*  --  64* 71*  CREATININE 2.19* 2.14* 2.24* 2.92*  CALCIUM 8.7*  --  8.5* 8.2*   GFR: Estimated Creatinine Clearance: 30.8 mL/min (A) (by C-G formula based on SCr of 2.92 mg/dL (H)). Liver Function Tests: No results for input(s): AST, ALT, ALKPHOS, BILITOT, PROT, ALBUMIN in the last 168 hours. No results for input(s): LIPASE, AMYLASE in the last 168 hours. No results for input(s): AMMONIA in the last 168 hours. Coagulation Profile:  Recent Labs Lab 04/29/17 0411  INR 0.96   Cardiac Enzymes:  Recent Labs Lab 04/28/17 0540 04/28/17 1031 04/28/17 1822  TROPONINI <0.03 <0.03 <0.03   BNP (last 3 results)  Recent Labs  11/10/16 1418  PROBNP 202   HbA1C: No results for input(s): HGBA1C in the last 72 hours. CBG:  Recent Labs Lab 04/30/17 0555 04/30/17 0720 04/30/17 0801 04/30/17 1003 04/30/17 1223  GLUCAP 318* 244* 219* 252* 309*   Lipid Profile: No results for input(s): CHOL, HDL, LDLCALC, TRIG, CHOLHDL, LDLDIRECT in the last 72 hours. Thyroid Function Tests: No results for input(s): TSH, T4TOTAL, FREET4, T3FREE, THYROIDAB in the last 72 hours. Anemia Panel: No results for input(s): VITAMINB12, FOLATE, FERRITIN, TIBC, IRON, RETICCTPCT in the last 72 hours. Urine analysis:     Component Value Date/Time   COLORURINE YELLOW 12/12/2016 1948   APPEARANCEUR HAZY (A) 12/12/2016 1948   LABSPEC 1.011 12/12/2016 1948   PHURINE 5.0 12/12/2016 1948   GLUCOSEU >=500 (A) 12/12/2016 1948   HGBUR NEGATIVE 12/12/2016 1948   BILIRUBINUR neg 12/17/2016 1538   KETONESUR NEGATIVE 12/12/2016 1948   PROTEINUR >=300 12/17/2016 1538   PROTEINUR 100 (A) 12/12/2016 1948   UROBILINOGEN 0.2 12/17/2016 1538   UROBILINOGEN 0.2 05/29/2010 2100   NITRITE neg 12/17/2016 1538   NITRITE NEGATIVE 12/12/2016 1948   LEUKOCYTESUR Negative 12/17/2016 1538   Sepsis Labs: Invalid input(s): PROCALCITONIN, LACTICIDVEN  No results found for this or any previous visit (from the past 240 hour(s)).    Radiology Studies: No results found.   Scheduled Meds: . allopurinol  100 mg Oral Daily  . amLODipine  10 mg Oral Daily  . aspirin EC  81 mg Oral Daily  . atorvastatin  80 mg Oral q1800  . carvedilol  25 mg Oral BID WC  . colchicine  0.6 mg Oral Daily  . ferrous sulfate  325 mg Oral BID WC  . heparin  5,000 Units Subcutaneous Q8H  . hydrALAZINE  50 mg Oral TID  . insulin aspart  0-5 Units Subcutaneous QHS  . insulin aspart  0-9 Units Subcutaneous TID WC  . insulin aspart  18 Units Subcutaneous TID WC  . insulin glargine  25 Units Subcutaneous BID  . isosorbide mononitrate  90 mg Oral Daily  . mometasone-formoterol  2 puff Inhalation BID  . pantoprazole  40 mg Oral Daily  . pregabalin  100 mg Oral TID  . sodium chloride flush  3 mL Intravenous Q12H  . ticagrelor  90 mg Oral BID  . valACYclovir  500 mg Oral Daily   Continuous Infusions:   Marzetta Board, MD, PhD [facility] Pager [phone]  If 7PM-7AM, please contact night-coverage [email or web address] Password [email or web address] 04/30/2017, 12:48 PM

## 2017-04-30 NOTE — Progress Notes (Signed)
Progress Note  Patient Name: [name] Date of Encounter: 04/30/2017  Primary Cardiologist: Dr. Burt Knack  Subjective   Feeling well. No chest pain, sob or palpitations.   Inpatient Medications    Scheduled Meds: . allopurinol  100 mg Oral Daily  . amLODipine  10 mg Oral Daily  . aspirin EC  81 mg Oral Daily  . atorvastatin  80 mg Oral q1800  . carvedilol  25 mg Oral BID WC  . colchicine  0.6 mg Oral Daily  . ferrous sulfate  325 mg Oral BID WC  . heparin  5,000 Units Subcutaneous Q8H  . hydrALAZINE  50 mg Oral TID  . insulin aspart  0-5 Units Subcutaneous QHS  . insulin aspart  0-9 Units Subcutaneous TID WC  . insulin aspart  18 Units Subcutaneous TID WC  . insulin glargine  25 Units Subcutaneous BID  . isosorbide mononitrate  90 mg Oral Daily  . mometasone-formoterol  2 puff Inhalation BID  . pantoprazole  40 mg Oral Daily  . pregabalin  100 mg Oral TID  . sodium chloride flush  3 mL Intravenous Q12H  . ticagrelor  90 mg Oral BID  . valACYclovir  500 mg Oral Daily   Continuous Infusions:  PRN Meds: acetaminophen, albuterol, dextrose, fluticasone, hydrOXYzine, ketotifen, ondansetron (ZOFRAN) IV, sodium chloride flush   Vital Signs    Vitals:   04/29/17 1748 04/29/17 2052 04/29/17 2113 04/30/17 0550  BP: (!) 153/76 (!) 153/70  (!) 164/73  Pulse: 92 94  85  Resp:  18  20  Temp:  97.9 F (36.6 C)  98.4 F (36.9 C)  TempSrc:  Oral  Oral  SpO2:  98% 98% 100%  Weight:    (!) 306 lb 3.2 oz (138.9 kg)  Height:        Intake/Output Summary (Last 24 hours) at 04/30/17 0925 Last data filed at 04/29/17 2347  Gross per 24 hour  Intake           432.63 ml  Output             2100 ml  Net         -1667.37 ml   Filed Weights   04/28/17 0900 04/29/17 0441 04/30/17 0550  Weight: (!) 300 lb 8 oz (136.3 kg) 299 lb (135.6 kg) (!) 306 lb 3.2 oz (138.9 kg)    Telemetry    SR - Personally Reviewed  ECG    N/A - Personally Reviewed  Physical Exam   GEN:  Obese AA female in no acute distress.   Neck: No JVD Cardiac: RRR, soft systolic murmurs, rubs, or gallops. Mild/Trace BL LE edema Respiratory: Clear to auscultation bilaterally. GI: Soft, nontender, non-distended  MS: No edema; No deformity. Neuro:  Nonfocal  Psych: Normal affect   Labs    Chemistry Recent Labs Lab 04/28/17 0003 04/28/17 0540 04/29/17 0411 04/30/17 0539  NA 136  --  134* 130*  K 3.7  --  4.0 4.1  CL 99*  --  97* 98*  CO2 26  --  25 21*  GLUCOSE 198*  --  328* 284*  BUN 69*  --  64* 71*  CREATININE 2.19* 2.14* 2.24* 2.92*  CALCIUM 8.7*  --  8.5* 8.2*  GFRNONAA 23* 24* 23* 17*  GFRAA 27* 28* 26* 19*  ANIONGAP 11  --  12 11     Hematology Recent Labs Lab 04/28/17 0003 04/28/17 0540 04/29/17 0411  WBC 6.5 6.0 5.0  RBC 3.57* 3.44* 3.28*  HGB 10.0* 9.6* 9.3*  HCT 30.6* 29.7* 28.5*  MCV 85.7 86.3 86.9  MCH 28.0 27.9 28.4  MCHC 32.7 32.3 32.6  RDW 17.5* 17.2* 17.0*  PLT 227 202 212    Cardiac Enzymes Recent Labs Lab 04/28/17 0540 04/28/17 1031 04/28/17 1822  TROPONINI <0.03 <0.03 <0.03    Recent Labs Lab 04/28/17 0013  TROPIPOC 0.01     BNP Recent Labs Lab 04/28/17 0003  BNP 38.6     Radiology    No results found.  Cardiac Studies   LEFT HEART CATH AND CORONARY ANGIOGRAPHY  Conclusion   Conclusions: 1. Widely patent mid LAD stent. 2. No significant change in mild to moderate LCx, OM, and diagonal disease compared with 09/2016. 3. Normal left ventricular filling pressure.  Recommendations: 1. Continue medical therapy and secondary prevention. I will restart isosorbide mononitrate at 90 mg daily with hope of weaning off NTG infusion later today. 2. Gentle hydration given CKD and normal LVEDP. Hold furosemide today and reassess volume status and renal function tomorrow before restarting diuretic therapy.     Patient Profile     61 y.o. female with PMH of IDDM, CRI-4, HTN, HLD, OSA, Hep C, and CAD s/p DES to pLAD who  presented with chest pain.   Reports pain is similar to that which she experienced with previous PCI back in March but not as intense.  Assessment & Plan    1. Chest pain - Troponin negative x3. Continued to have chest pain and was placed on IV nitro with resolution of pain. - Cath showed patient LAD stent. No significant change in mild to moderate LCx, OM, and diagonal disease compared with 09/2016. - Continue DAPT with ASA/Brilinta, Imdur 90mg  qd, coreg and statin.  2.  Chronic combined CHF - Diuretics hold yesterday for cath. Renal function worsen overnight despite gentle hydration. Resumption per MD (home lasix  dose 80mg  AM and 40mg  afternoon)  3. Acute on CKD, stage IV - Scr worsen to 2.92 post cath from 2.2. Diuretics on hold. Seen by nephrology as an outpatient, told she is likely need HD in the future.  4. DM - Per primary team  5. HTN - Relatively stable   For questions or updates, please contact [place] Please consult [email or web address] for contact info under Cardiology/STEMI.   Signed, Leanor Kail, PA  04/30/2017, 9:25 AM    Patient seen, examined. Available data reviewed. Agree with findings, assessment, and plan as outlined by Robbie Lis, PA-C.  The patient is independently interviewed and examined.  On exam, the patient is alert and oriented, no distress.  Lungs are clear.  Heart is regular rate and rhythm.  Abdomen is soft and nontender.  The right radial site is clear without hematoma or tenderness.  Extremities show no edema.  Cardiac catheterization data is reviewed.  Her LAD stent is widely patent.  Residual CAD is stable without high grade coronary stenoses.  Left ventricular filling pressures were normal.  Plan to continue her current medical program.  Resume oral furosemide tomorrow as long as creatinine is not trending up further.  Will arrange outpatient cardiology follow-up.  Please call if we can be of any further assistance.  Sherren Mocha,  M.D. 04/30/2017 12:54 PM

## 2017-04-30 NOTE — Progress Notes (Signed)
[place] prior authorization completed and submitted to [facility] Medicaid. Pending approval. Confirmation #[id number]

## 2017-05-01 LAB — BASIC METABOLIC PANEL
Anion gap: 12 (ref 5–15)
BUN: 80 mg/dL — ABNORMAL HIGH (ref 6–20)
CO2: 21 mmol/L — ABNORMAL LOW (ref 22–32)
Calcium: 7.9 mg/dL — ABNORMAL LOW (ref 8.9–10.3)
Chloride: 97 mmol/L — ABNORMAL LOW (ref 101–111)
Creatinine, Ser: 3.17 mg/dL — ABNORMAL HIGH (ref 0.44–1.00)
GFR calc Af Amer: 17 mL/min — ABNORMAL LOW (ref >60)
GFR calc non Af Amer: 15 mL/min — ABNORMAL LOW (ref >60)
Glucose, Bld: 326 mg/dL — ABNORMAL HIGH (ref 65–99)
Potassium: 4.3 mmol/L (ref 3.5–5.1)
Sodium: 130 mmol/L — ABNORMAL LOW (ref 135–145)

## 2017-05-01 LAB — GLUCOSE, CAPILLARY
Glucose-Capillary: 311 mg/dL — ABNORMAL HIGH (ref 65–99)
Glucose-Capillary: 195 mg/dL — ABNORMAL HIGH (ref 65–99)
Glucose-Capillary: 174 mg/dL — ABNORMAL HIGH (ref 65–99)
Glucose-Capillary: 186 mg/dL — ABNORMAL HIGH (ref 65–99)

## 2017-05-01 MED ORDER — INSULIN GLARGINE 100 UNIT/ML ~~LOC~~ SOLN
28.0000 [IU] | Freq: Two times a day (BID) | SUBCUTANEOUS | Status: DC
  Administered 2017-05-01 – 2017-05-02 (×3): 28 [IU] via SUBCUTANEOUS
  Filled 2017-05-01 (×3): qty 0.28

## 2017-05-01 MED ORDER — INSULIN ASPART 100 UNIT/ML ~~LOC~~ SOLN
20.0000 [IU] | Freq: Three times a day (TID) | SUBCUTANEOUS | Status: DC
  Administered 2017-05-01 – 2017-05-02 (×4): 20 [IU] via SUBCUTANEOUS

## 2017-05-01 NOTE — Progress Notes (Signed)
Progress Note  Patient Name: [name] Date of Encounter: 05/01/2017  Primary Cardiologist: Dr. Burt Knack  Subjective   Denies CP or dyspnea; some bloody nasal DC from "sinuses"  Inpatient Medications    Scheduled Meds: . allopurinol  100 mg Oral Daily  . amLODipine  10 mg Oral Daily  . aspirin EC  81 mg Oral Daily  . atorvastatin  80 mg Oral q1800  . carvedilol  25 mg Oral BID WC  . colchicine  0.6 mg Oral Daily  . ferrous sulfate  325 mg Oral BID WC  . hydrALAZINE  50 mg Oral TID  . insulin aspart  0-5 Units Subcutaneous QHS  . insulin aspart  0-9 Units Subcutaneous TID WC  . insulin aspart  20 Units Subcutaneous TID WC  . insulin glargine  28 Units Subcutaneous BID  . isosorbide mononitrate  90 mg Oral Daily  . mometasone-formoterol  2 puff Inhalation BID  . pantoprazole  40 mg Oral Daily  . pregabalin  100 mg Oral TID  . sodium chloride flush  3 mL Intravenous Q12H  . ticagrelor  90 mg Oral BID  . valACYclovir  500 mg Oral Daily   Continuous Infusions:  PRN Meds: acetaminophen, albuterol, dextrose, fluticasone, HYDROcodone-acetaminophen, hydrOXYzine, ketotifen, ondansetron (ZOFRAN) IV, sodium chloride flush   Vital Signs    Vitals:   04/30/17 2217 05/01/17 0449 05/01/17 0755 05/01/17 0953  BP: (!) 156/76 (!) 146/67  (!) 148/76  Pulse: 83 82    Resp:  18    Temp:  98 F (36.7 C)    TempSrc:  Oral    SpO2:  99% 99%   Weight:  (!) 139.8 kg (308 lb 3.2 oz)    Height:        Intake/Output Summary (Last 24 hours) at 05/01/17 1051 Last data filed at 05/01/17 1016  Gross per 24 hour  Intake             1183 ml  Output             1340 ml  Net             -157 ml   Filed Weights   04/29/17 0441 04/30/17 0550 05/01/17 0449  Weight: 135.6 kg (299 lb) (!) 138.9 kg (306 lb 3.2 oz) (!) 139.8 kg (308 lb 3.2 oz)    Telemetry    SR - Personally Reviewed   Physical Exam   GEN: WD Obese NAD Neck: Supple Cardiac: RRR Respiratory: CTA, no wheeze GI:  Soft, nontender, non-distended, no masses MS: No edema Neuro:  Grossly intact   Labs    Chemistry  Recent Labs Lab 04/29/17 0411 04/30/17 0539 05/01/17 0432  NA 134* 130* 130*  K 4.0 4.1 4.3  CL 97* 98* 97*  CO2 25 21* 21*  GLUCOSE 328* 284* 326*  BUN 64* 71* 80*  CREATININE 2.24* 2.92* 3.17*  CALCIUM 8.5* 8.2* 7.9*  GFRNONAA 23* 17* 15*  GFRAA 26* 19* 17*  ANIONGAP 12 11 12      Hematology  Recent Labs Lab 04/28/17 0003 04/28/17 0540 04/29/17 0411  WBC 6.5 6.0 5.0  RBC 3.57* 3.44* 3.28*  HGB 10.0* 9.6* 9.3*  HCT 30.6* 29.7* 28.5*  MCV 85.7 86.3 86.9  MCH 28.0 27.9 28.4  MCHC 32.7 32.3 32.6  RDW 17.5* 17.2* 17.0*  PLT 227 202 212    Cardiac Enzymes  Recent Labs Lab 04/28/17 0540 04/28/17 1031 04/28/17 1822  TROPONINI <0.03 <0.03 <0.03  Recent Labs Lab 04/28/17 0013  TROPIPOC 0.01     BNP  Recent Labs Lab 04/28/17 0003  BNP 38.6     Radiology    Dg Chest 2 View  Result Date: 04/30/2017 CLINICAL DATA:  Dyspnea today. EXAM: CHEST  2 VIEW COMPARISON:  04/28/2017 FINDINGS: Shallow inspiration. Normal heart size and pulmonary vascularity. No focal airspace disease or consolidation in the lungs. No blunting of costophrenic angles. No pneumothorax. Mediastinal contours appear intact. Coronary artery stents. IMPRESSION: No active cardiopulmonary disease. Electronically Signed   By: Lucienne Capers M.D.   On: 04/30/2017 21:43    Cardiac Studies   LEFT HEART CATH AND CORONARY ANGIOGRAPHY  Conclusion   Conclusions: 1. Widely patent mid LAD stent. 2. No significant change in mild to moderate LCx, OM, and diagonal disease compared with 09/2016. 3. Normal left ventricular filling pressure.  Recommendations: 1. Continue medical therapy and secondary prevention. I will restart isosorbide mononitrate at 90 mg daily with hope of weaning off NTG infusion later today. 2. Gentle hydration given CKD and normal LVEDP. Hold furosemide today and  reassess volume status and renal function tomorrow before restarting diuretic therapy.     Patient Profile     61 y.o. female with PMH of IDDM, CRI-4, HTN, HLD, OSA, Hep C, and CAD s/p DES to pLAD who presented with chest pain.   Reports pain is similar to that which she experienced with previous PCI back in March but not as intense.  Assessment & Plan    1. Chest pain - No further symptoms - Cath showed patient LAD stent. No significant change in mild to moderate LCx, OM, and diagonal disease compared with 09/2016. - Continue ASA/Brilinta, Imdur 90mg  qd, coreg and statin.  2.  Chronic combined CHF - Patient remains euvolemic on examination. Her renal function is worse. Would continue to hold diuretics for now. We'll resume as renal function stabilizes.  3. Acute on CKD, stage IV - Renal function worse today. Hopefully it will level off over the next 24 hours. Recheck tomorrow morning. Hold diuretics for now.  4. DM - Per primary team  5. HTN - Blood pressure controlled. Continue present medications.  For questions or updates, please contact [place] Please consult [email or web address] for contact info under Cardiology/STEMI.   Signed, Kirk Ruths, MD  05/01/2017, 10:51 AM

## 2017-05-01 NOTE — Progress Notes (Signed)
PROGRESS NOTE  JAHNAYA BRANSCOME TKZ:[id number] DOB: [birth year] DOA: 04/28/2017 PCP: Arnoldo Morale, MD   LOS: 2 days   Brief Narrative / Interim history: Patient is a 61 year old morbidly obese female with history of chronic kidney disease stage IV, coronary artery disease status post recent PCI in March 5732, diastolic CHF, who presented with chest pain.  Cardiology consulted.  She underwent a cardiac cath on 10/18 without significant acute findings.  Following the cath, which she has been stable however creatinine slowly climbing and therefore she is being observed for another 24 hours  Assessment & Plan: Principal Problem:   Chest pain with moderate risk of acute coronary syndrome Active Problems:   Diabetes mellitus type 2, uncontrolled, with complications (HCC)   Anemia in stage 3 chronic kidney disease (HCC)   Essential hypertension   Obesity   CKD (chronic kidney disease) stage 4, GFR 15-29 ml/min (HCC)   OSA (obstructive sleep apnea)   Hep C w/o coma, chronic (HCC)   History of non-ST elevation myocardial infarction (NSTEMI)   Chronic gout due to renal impairment of multiple sites without tophus   Chronic combined systolic and diastolic CHF (congestive heart failure) (HCC)   Acute on chronic combined systolic and diastolic CHF, NYHA class 4 (HCC)   Chest pain   Coronary artery disease/chest pain -cardiology was consulted, and patient underwent a cardiac catheterization on 10/18 which showed patent LAD stent and no change in mild to moderate left circumflex OM and diagonal disease compared to March 2018 -Continue Brilinta, aspirin -Her chest pain is now resolved  Poorly controlled diabetes mellitus -Most recent A1c was done in August 2018 and it was elevated at 9.8. -CBGs poorly controlled on 10/18 likely due to steroids which she received precath as she is allergic to contrast dye -Continue to up titrate Lantus to 28 twice daily as well as NovoLog to 20+ sliding  scale  Chronic combined systolic and diastolic CHF -She appears euvolemic on exam, most recent 2D echo was done in August 2018, showed an EF of 20-25%, grade 1 diastolic dysfunction -Lasix per cardiology, now on hold  Chronic kidney disease stage IV -Creatinine was somewhat at baseline, however increased from 2.2-2.9 and now 3.17, continue to monitor  Hypertension -Continue Norvasc, Coreg, hydralazine, Imdur  Hyperlipidemia -Continue statin   DVT prophylaxis: Heparin Code Status: Full code Family Communication: Daughter at bedside Disposition Plan: Home likely 1 day pending kidney function stability  Consultants:   Cardiology  Procedures:   LVH 10/18 Conclusions: 1. Widely patent mid LAD stent. 2. No significant change in mild to moderate LCx, OM, and diagonal disease compared with 09/2016. 3. Normal left ventricular filling pressure.  Recommendations: 1. Continue medical therapy and secondary prevention. I will restart isosorbide mononitrate at 90 mg daily with hope of weaning off NTG infusion later today. 2. Gentle hydration given CKD and normal LVEDP. Hold furosemide today and reassess volume status and renal function tomorrow before restarting diuretic therapy.  Antimicrobials:  None    Subjective: -No chest pain, no shortness of breath, no abdominal pain, nausea or vomiting  Objective: Vitals:   05/01/17 0449 05/01/17 0755 05/01/17 0953 05/01/17 1218  BP: (!) 146/67  (!) 148/76 122/66  Pulse: 82   80  Resp: 18     Temp: 98 F (36.7 C)     TempSrc: Oral     SpO2: 99% 99%  97%  Weight: (!) 139.8 kg (308 lb 3.2 oz)     Height:  Intake/Output Summary (Last 24 hours) at 05/01/17 1257 Last data filed at 05/01/17 1016  Gross per 24 hour  Intake             1183 ml  Output             1340 ml  Net             -157 ml   Filed Weights   04/29/17 0441 04/30/17 0550 05/01/17 0449  Weight: 135.6 kg (299 lb) (!) 138.9 kg (306 lb 3.2 oz) (!) 139.8 kg  (308 lb 3.2 oz)    Examination:  Constitutional: NAD, calm, comfortable Eyes: lids and conjunctivae normal ENMT: Mucous membranes are moist.  Respiratory: clear to auscultation bilaterally, no wheezing, no crackles.  Cardiovascular: Regular rate and rhythm, no murmurs / rubs / gallops.  Abdomen: no tenderness. Bowel sounds positive.    Data Reviewed: I have independently reviewed following labs and imaging   CBC:  Recent Labs Lab 04/28/17 0003 04/28/17 0540 04/29/17 0411  WBC 6.5 6.0 5.0  HGB 10.0* 9.6* 9.3*  HCT 30.6* 29.7* 28.5*  MCV 85.7 86.3 86.9  PLT 227 202 761   Basic Metabolic Panel:  Recent Labs Lab 04/28/17 0003 04/28/17 0540 04/29/17 0411 04/30/17 0539 05/01/17 0432  NA 136  --  134* 130* 130*  K 3.7  --  4.0 4.1 4.3  CL 99*  --  97* 98* 97*  CO2 26  --  25 21* 21*  GLUCOSE 198*  --  328* 284* 326*  BUN 69*  --  64* 71* 80*  CREATININE 2.19* 2.14* 2.24* 2.92* 3.17*  CALCIUM 8.7*  --  8.5* 8.2* 7.9*   GFR: Estimated Creatinine Clearance: 28.4 mL/min (A) (by C-G formula based on SCr of 3.17 mg/dL (H)). Liver Function Tests: No results for input(s): AST, ALT, ALKPHOS, BILITOT, PROT, ALBUMIN in the last 168 hours. No results for input(s): LIPASE, AMYLASE in the last 168 hours. No results for input(s): AMMONIA in the last 168 hours. Coagulation Profile:  Recent Labs Lab 04/29/17 0411  INR 0.96   Cardiac Enzymes:  Recent Labs Lab 04/28/17 0540 04/28/17 1031 04/28/17 1822  TROPONINI <0.03 <0.03 <0.03   BNP (last 3 results)  Recent Labs  11/10/16 1418  PROBNP 202   HbA1C: No results for input(s): HGBA1C in the last 72 hours. CBG:  Recent Labs Lab 04/30/17 1223 04/30/17 1651 04/30/17 2110 05/01/17 0734 05/01/17 1217  GLUCAP 309* 319* 259* 311* 195*   Lipid Profile: No results for input(s): CHOL, HDL, LDLCALC, TRIG, CHOLHDL, LDLDIRECT in the last 72 hours. Thyroid Function Tests: No results for input(s): TSH, T4TOTAL, FREET4,  T3FREE, THYROIDAB in the last 72 hours. Anemia Panel: No results for input(s): VITAMINB12, FOLATE, FERRITIN, TIBC, IRON, RETICCTPCT in the last 72 hours. Urine analysis:    Component Value Date/Time   COLORURINE YELLOW 12/12/2016 1948   APPEARANCEUR HAZY (A) 12/12/2016 1948   LABSPEC 1.011 12/12/2016 1948   PHURINE 5.0 12/12/2016 1948   GLUCOSEU >=500 (A) 12/12/2016 1948   HGBUR NEGATIVE 12/12/2016 1948   BILIRUBINUR neg 12/17/2016 1538   KETONESUR NEGATIVE 12/12/2016 1948   PROTEINUR >=300 12/17/2016 1538   PROTEINUR 100 (A) 12/12/2016 1948   UROBILINOGEN 0.2 12/17/2016 1538   UROBILINOGEN 0.2 05/29/2010 2100   NITRITE neg 12/17/2016 1538   NITRITE NEGATIVE 12/12/2016 1948   LEUKOCYTESUR Negative 12/17/2016 1538   Sepsis Labs: Invalid input(s): PROCALCITONIN, LACTICIDVEN  No results found for this or any previous visit (from  the past 240 hour(s)).    Radiology Studies: Dg Chest 2 View  Result Date: 04/30/2017 CLINICAL DATA:  Dyspnea today. EXAM: CHEST  2 VIEW COMPARISON:  04/28/2017 FINDINGS: Shallow inspiration. Normal heart size and pulmonary vascularity. No focal airspace disease or consolidation in the lungs. No blunting of costophrenic angles. No pneumothorax. Mediastinal contours appear intact. Coronary artery stents. IMPRESSION: No active cardiopulmonary disease. Electronically Signed   By: Lucienne Capers M.D.   On: 04/30/2017 21:43     Scheduled Meds: . allopurinol  100 mg Oral Daily  . amLODipine  10 mg Oral Daily  . aspirin EC  81 mg Oral Daily  . atorvastatin  80 mg Oral q1800  . carvedilol  25 mg Oral BID WC  . colchicine  0.6 mg Oral Daily  . ferrous sulfate  325 mg Oral BID WC  . hydrALAZINE  50 mg Oral TID  . insulin aspart  0-5 Units Subcutaneous QHS  . insulin aspart  0-9 Units Subcutaneous TID WC  . insulin aspart  20 Units Subcutaneous TID WC  . insulin glargine  28 Units Subcutaneous BID  . isosorbide mononitrate  90 mg Oral Daily  .  mometasone-formoterol  2 puff Inhalation BID  . pantoprazole  40 mg Oral Daily  . pregabalin  100 mg Oral TID  . sodium chloride flush  3 mL Intravenous Q12H  . ticagrelor  90 mg Oral BID  . valACYclovir  500 mg Oral Daily   Continuous Infusions:   Marzetta Board, MD, PhD [facility] Pager [phone]  If 7PM-7AM, please contact night-coverage [email or web address] Password [email or web address] 05/01/2017, 12:57 PM

## 2017-05-02 ENCOUNTER — Telehealth: Payer: Self-pay | Admitting: Physician Assistant

## 2017-05-02 DIAGNOSIS — E118 Type 2 diabetes mellitus with unspecified complications: Secondary | ICD-10-CM

## 2017-05-02 DIAGNOSIS — E1165 Type 2 diabetes mellitus with hyperglycemia: Secondary | ICD-10-CM

## 2017-05-02 LAB — BASIC METABOLIC PANEL
Anion gap: 11 (ref 5–15)
BUN: 85 mg/dL — ABNORMAL HIGH (ref 6–20)
CO2: 22 mmol/L (ref 22–32)
Calcium: 8.2 mg/dL — ABNORMAL LOW (ref 8.9–10.3)
Chloride: 101 mmol/L (ref 101–111)
Creatinine, Ser: 3.08 mg/dL — ABNORMAL HIGH (ref 0.44–1.00)
GFR calc Af Amer: 18 mL/min — ABNORMAL LOW (ref >60)
GFR calc non Af Amer: 16 mL/min — ABNORMAL LOW (ref >60)
Glucose, Bld: 286 mg/dL — ABNORMAL HIGH (ref 65–99)
Potassium: 4.3 mmol/L (ref 3.5–5.1)
Sodium: 134 mmol/L — ABNORMAL LOW (ref 135–145)

## 2017-05-02 LAB — GLUCOSE, CAPILLARY
Glucose-Capillary: 269 mg/dL — ABNORMAL HIGH (ref 65–99)
Glucose-Capillary: 189 mg/dL — ABNORMAL HIGH (ref 65–99)

## 2017-05-02 MED ORDER — COLCHICINE 0.6 MG PO TABS: 0.3000 mg | ORAL_TABLET | Freq: Every day | ORAL | Status: DC

## 2017-05-02 MED ORDER — FUROSEMIDE 40 MG PO TABS: ORAL_TABLET | ORAL | 0 refills | Status: DC

## 2017-05-02 NOTE — Discharge Summary (Addendum)
[facility] Discharge Summary  [name] [id number] DOB: Jul 04, 1956 DOA: 04/28/2017  PCP: Heather Morale, MD  Admit date: 04/28/2017 Discharge date: 05/02/2017  Recommendations for Outpatient Follow-up:  1. Needs recheck BMet in 48 hours by cardiology   2. Needs to f/u w/ cards and PCP in next 1- 2 weeks   Discharge Diagnoses:  1. Chest pain   Discharge Condition: stable  Disposition: home   Diet recommendation: heart healthy   Filed Weights   04/30/17 0550 05/01/17 0449 05/02/17 0515  Weight: (!) 138.9 kg (306 lb 3.2 oz) (!) 139.8 kg (308 lb 3.2 oz) (!) 139.8 kg (308 lb 1.6 oz)    History of present illness:  Per H&P   [name] is a 61 y.o. femalewith history of CAD status post stenting in March 2018, CHF, chronic kidney disease stage IV, diabetes mellitus type 2, hepatitis C presents to the ER because of increasing exertional shortness of breath and fatigue since morning with chest pain. Patient's chest pain started last night around 10 PM. Pain is retrosternal with mild radiation to the left arm. Shortness of breath was present since yesterday morning. Denies any fever chills or cough. Patient states she has been compliant with her medications. Prior to coming to the ER patient tried sublingual nitroglycerin but pain did not improve.  Hospital Course:    Coronary artery disease/chest pain -cardiology was consulted, appreciate their input and help in care of patient - No further symptoms - Cath showed patient LAD stent. No significant change in mild to moderate LCx, OM, and diagonal disease compared with 09/2016. - Continue ASA/Brilinta, Imdur 90mg  qd, coreg and statin.   Poorly controlled diabetes mellitus -Most recent A1c was done in August 2018 and it was elevated at 9.8. -continue more aggressive therapy by PCP as outpatient   Chronic combined systolic and diastolic CHF -She appears euvolemic on exam, most recent 2D echo was done in August 2018,  showed an EF of 50-93%, grade 1 diastolic dysfunction -Lasix placed on hold for AKI post-cath , resume home lasix tomorrow (10/21) at 80mg  in AM and 40mg  in PM -Cr 2.1 on admission , uptrended to peak of 3.17 day prior to discharge, down to 3.08 at time of d/c home    Chronic kidney disease stage IV -Cr 2.1 on admission , uptrended to peak of 3.17 day prior to discharge, down to 3.08 at time of d/c home  - recheck Bmet in 48 hours w/ decreased lasix dosage   Gout  - decreasing dose of colchicine from 0.6mg  to 0.3mg  daily 2/2 GFR , Cr Cl   Hypertension -Continue Norvasc, Coreg, hydralazine, Imdur  Hyperlipidemia -Continue statin   Discharge Instructions  Discharge Instructions    Call MD for:  persistant dizziness or light-headedness    Complete by:  As directed    Call MD for:  persistant nausea and vomiting    Complete by:  As directed    Diet - low sodium heart healthy    Complete by:  As directed    Discharge instructions    Complete by:  As directed    Follow up with cardiology and PCP in 1-2 weeks   Increase activity slowly    Complete by:  As directed      Allergies as of 05/02/2017      Reactions   Shellfish Allergy Anaphylaxis, Swelling   Diazepam Other (See Comments)   "felt like out of body experience"   Morphine And Related Itching  Medication List    STOP taking these medications   Colchicine 0.6 MG Caps Replaced by:  colchicine 0.6 MG tablet     TAKE these medications   ACCU-CHEK AVIVA device Use as instructed daily.   accu-chek softclix lancets Use as instructed daily.   acetaminophen-codeine 300-30 MG tablet Commonly known as:  TYLENOL #3 Take 1 tablet by mouth every 12 (twelve) hours as needed for severe pain. For osteoarthritis (unable to take NSAIDs due to CKD)   albuterol 108 (90 Base) MCG/ACT inhaler Commonly known as:  VENTOLIN HFA Inhale 2 puffs into the lungs every 4 (four) hours as needed for wheezing or shortness of  breath.   albuterol (2.5 MG/3ML) 0.083% nebulizer solution Commonly known as:  PROVENTIL Take 3 mLs (2.5 mg total) by nebulization every 6 (six) hours as needed for wheezing or shortness of breath.   allopurinol 100 MG tablet Commonly known as:  ZYLOPRIM Take 1 tablet (100 mg total) by mouth daily.   amLODipine 10 MG tablet Commonly known as:  NORVASC Take 1 tablet (10 mg total) by mouth daily.   aspirin 81 MG EC tablet Take 1 tablet (81 mg total) by mouth daily.   atorvastatin 80 MG tablet Commonly known as:  LIPITOR Take 1 tablet (80 mg total) by mouth daily at 6 PM.   carvedilol 25 MG tablet Commonly known as:  COREG TAKE 1 TABLET BY MOUTH 2 TIMES DAILY WITH A MEAL.   colchicine 0.6 MG tablet Take 0.5 tablets (0.3 mg total) by mouth daily. Replaces:  Colchicine 0.6 MG Caps   exenatide 10 MCG/0.04ML Sopn injection Commonly known as:  BYETTA 10 MCG PEN Inject 0.04 mLs (10 mcg total) into the skin 2 (two) times daily with a meal.   ferrous sulfate 325 (65 FE) MG tablet Take 1 tablet (325 mg total) by mouth 2 (two) times daily with a meal.   fluticasone 50 MCG/ACT nasal spray Commonly known as:  FLONASE Place 2 sprays into both nostrils daily as needed for allergies or rhinitis.   Fluticasone-Salmeterol 250-50 MCG/DOSE Aepb Commonly known as:  ADVAIR DISKUS Inhale 1 puff into the lungs 2 (two) times daily. What changed:  when to take this  reasons to take this   furosemide 40 MG tablet Commonly known as:  LASIX Take 2 pills (80 mg) in the morning and 1 pill (40mg ) in afternoon What changed:  additional instructions   glucose blood test strip Commonly known as:  TRUE METRIX BLOOD GLUCOSE TEST Use as instructed   glucose blood test strip Commonly known as:  ACCU-CHEK AVIVA Use as instructed daily   hydrALAZINE 50 MG tablet Commonly known as:  APRESOLINE Take 1 tablet (50 mg total) by mouth 3 (three) times daily.   hydrOXYzine 50 MG tablet Commonly known  as:  ATARAX/VISTARIL TAKE 1 TABLET (50 MG TOTAL) BY MOUTH 3 (THREE) TIMES DAILY AS NEEDED. What changed:  reasons to take this   insulin aspart 100 UNIT/ML injection Commonly known as:  novoLOG 0-12 units subcutaneously 3 times daily before meals according to sliding scale   Insulin Glargine 100 UNIT/ML Solostar Pen Commonly known as:  LANTUS SOLOSTAR Inject 40 Units into the skin 2 (two) times daily. What changed:  how much to take   isosorbide mononitrate 60 MG 24 hr tablet Commonly known as:  IMDUR Take 1 tablet (60 mg total) by mouth daily.   ketotifen 0.025 % ophthalmic solution Commonly known as:  ZADITOR Place 1 drop into both eyes  daily as needed (for dry eyes).   nitroGLYCERIN 0.4 MG SL tablet Commonly known as:  NITROSTAT Place 1 tablet (0.4 mg total) under the tongue every 5 (five) minutes x 3 doses as needed for chest pain.   omeprazole 20 MG capsule Commonly known as:  PRILOSEC Take 1 capsule (20 mg total) by mouth daily.   oxyCODONE-acetaminophen 5-325 MG tablet Commonly known as:  PERCOCET/ROXICET Take 1 tablet by mouth every 6 (six) hours as needed for severe pain.   pregabalin 100 MG capsule Commonly known as:  LYRICA Take 1 capsule (100 mg total) by mouth 3 (three) times daily.   ticagrelor 90 MG Tabs tablet Commonly known as:  BRILINTA Take 1 tablet (90 mg total) by mouth 2 (two) times daily.   valACYclovir 500 MG tablet Commonly known as:  VALTREX Take 1 tablet (500 mg total) by mouth daily.         The results of significant diagnostics from this hospitalization (including imaging, microbiology, ancillary and laboratory) are listed below for reference.    Significant Diagnostic Studies: Dg Chest 2 View  Result Date: 04/30/2017 CLINICAL DATA:  Dyspnea today. EXAM: CHEST  2 VIEW COMPARISON:  04/28/2017 FINDINGS: Shallow inspiration. Normal heart size and pulmonary vascularity. No focal airspace disease or consolidation in the lungs. No  blunting of costophrenic angles. No pneumothorax. Mediastinal contours appear intact. Coronary artery stents. IMPRESSION: No active cardiopulmonary disease. Electronically Signed   By: Lucienne Capers M.D.   On: 04/30/2017 21:43   Dg Chest 2 View  Result Date: 04/28/2017 CLINICAL DATA:  Shortness of breath on exertion today. Left-sided chest pain. History of CHF, diabetes, hypertension. EXAM: CHEST  2 VIEW COMPARISON:  03/17/2017 FINDINGS: The heart size and mediastinal contours are within normal limits. Both lungs are clear. The visualized skeletal structures are unremarkable. IMPRESSION: No active cardiopulmonary disease. Electronically Signed   By: Lucienne Capers M.D.   On: 04/28/2017 00:33   Dg Foot Complete Right  Result Date: 04/11/2017 CLINICAL DATA:  Painful right foot.  Diabetes.  Pain and swelling. EXAM: RIGHT FOOT COMPLETE - 3+ VIEW COMPARISON:  04/01/2016 FINDINGS: Mild chronic change at the fifth PIP joint. Mild degenerative change over the midfoot. No acute fracture or dislocation. No air within the soft tissues. IMPRESSION: No acute findings. Electronically Signed   By: Marin Olp M.D.   On: 04/11/2017 23:28   Mm Digital Diagnostic Unilat L  Addendum Date: 04/27/2017   ADDENDUM REPORT: 04/27/2017 08:10 ADDENDUM: Prior mammogram images from 2014 have become available. Upon review, these calcifications in the left breast were seen and not significantly different since that time. Therefore, no biopsy is necessary, and the patient may return to annual screening mammography. BI-RADS 2:  Benign findings. Electronically Signed   By: Ammie Ferrier M.D.   On: 04/27/2017 08:10   Result Date: 04/27/2017 CLINICAL DATA:  Screening recall for left breast calcifications. EXAM: DIGITAL DIAGNOSTIC LEFT MAMMOGRAM COMPARISON:  Previous exam(s). ACR Breast Density Category b: There are scattered areas of fibroglandular density. FINDINGS: In the upper outer far posterior aspect of the left  breast there is a 6 mm group of amorphous calcifications. No other suspicious calcifications, masses or areas of distortion are seen in the left breast. IMPRESSION: There is an indeterminate group of calcifications in the upper outer far posterior left breast. RECOMMENDATION: Stereotactic biopsy is recommended for the left breast calcifications. However, the patient states that she has outside mammogram images from [place] in approximately 2012 which are unavailable  today. We will attempt to retrieve those images, and if we find that these calcifications have been stable for greater than 2 years then the biopsy may be canceled. If the images are unable to be obtained, then we should proceed with biopsy. I have discussed the findings and recommendations with the patient. Results were also provided in writing at the conclusion of the visit. If applicable, a reminder letter will be sent to the patient regarding the next appointment. BI-RADS CATEGORY  4: Suspicious. Electronically Signed: By: Ammie Ferrier M.D. On: 04/15/2017 16:22    Microbiology: No results found for this or any previous visit (from the past 240 hour(s)).   Labs: Basic Metabolic Panel:  Recent Labs Lab 04/28/17 0003 04/28/17 0540 04/29/17 0411 04/30/17 0539 05/01/17 0432 05/02/17 0625  NA 136  --  134* 130* 130* 134*  K 3.7  --  4.0 4.1 4.3 4.3  CL 99*  --  97* 98* 97* 101  CO2 26  --  25 21* 21* 22  GLUCOSE 198*  --  328* 284* 326* 286*  BUN 69*  --  64* 71* 80* 85*  CREATININE 2.19* 2.14* 2.24* 2.92* 3.17* 3.08*  CALCIUM 8.7*  --  8.5* 8.2* 7.9* 8.2*   Liver Function Tests: No results for input(s): AST, ALT, ALKPHOS, BILITOT, PROT, ALBUMIN in the last 168 hours. No results for input(s): LIPASE, AMYLASE in the last 168 hours. No results for input(s): AMMONIA in the last 168 hours. CBC:  Recent Labs Lab 04/28/17 0003 04/28/17 0540 04/29/17 0411  WBC 6.5 6.0 5.0  HGB 10.0* 9.6* 9.3*  HCT 30.6* 29.7* 28.5*   MCV 85.7 86.3 86.9  PLT 227 202 212   Cardiac Enzymes:  Recent Labs Lab 04/28/17 0540 04/28/17 1031 04/28/17 1822  TROPONINI <0.03 <0.03 <0.03   BNP: BNP (last 3 results)  Recent Labs  12/01/16 0009 03/17/17 1300 04/28/17 0003  BNP 124.6* 18.8 38.6    ProBNP (last 3 results)  Recent Labs  11/10/16 1418  PROBNP 202    CBG:  Recent Labs Lab 05/01/17 0734 05/01/17 1217 05/01/17 1706 05/01/17 2102 05/02/17 0745  GLUCAP 311* 195* 174* 186* 269*    Principal Problem:   Chest pain with moderate risk of acute coronary syndrome Active Problems:   Diabetes mellitus type 2, uncontrolled, with complications (HCC)   Anemia in stage 3 chronic kidney disease (HCC)   Essential hypertension   Obesity   CKD (chronic kidney disease) stage 4, GFR 15-29 ml/min (HCC)   OSA (obstructive sleep apnea)   Hep C w/o coma, chronic (HCC)   History of non-ST elevation myocardial infarction (NSTEMI)   Chronic gout due to renal impairment of multiple sites without tophus   Chronic combined systolic and diastolic CHF (congestive heart failure) (HCC)   Acute on chronic combined systolic and diastolic CHF, NYHA class 4 (HCC)   Chest pain   Time coordinating discharge: 60 minutes   Signed:  Velna Hatchet, MD Internal Medicine Pager [phone] (if 7P to 7A, page night hospitalist on [email or web address]) 05/02/2017, 10:31 AM

## 2017-05-02 NOTE — Telephone Encounter (Signed)
Hi Triage,   This patient will be discharged from the hospital today (Sunday). Per Dr. Stanford Breed she needs early follow-up. At the very least she will need a stat BMET on Wednesday 10/24 with results going to [facility] due to rise in creatinine. I did not put this order in as I will not be in the office on Wednesday. Ideally, Dr. Stanford Breed would like for her to have an appointment that day and the provider can put in the BMET to review that day. It looks like both [facility] Rubin and [facility] have slots open that day. Can you please add on for visit, and if for some reason unable to, at least get BMET order in under provider that is there that day to review, and schedule APP visit ASAP? The BMET would be for contrast nephropathy. Thanks.  Rika Daughdrill PA-C

## 2017-05-02 NOTE — Progress Notes (Signed)
I have sent a message to our [facility] office's triage pool requesting f/u, and our office will call the patient with this information. Per discussion with Dr. Stanford Breed, would specifically prefer a f/u appointment with APP on Wednesday with recommendation to obtain BMET at that visit per provider seeing her. It does look like there will be several openings that day. If unable to accommodate appt on Wednesday Dr. Stanford Breed recommends obtaining BMET with results directed to Dr. Burt Knack.  Office will call patient to finalize plans.  Ashley Montminy PA-C

## 2017-05-02 NOTE — Progress Notes (Signed)
Pt has orders to be discharged. Discharge instructions given and pt has no additional questions at this time. Medication regimen reviewed and pt educated. Pt verbalized understanding and has no additional questions. Telemetry box removed. IV removed and site in good condition. Pt stable and waiting for transportation. 

## 2017-05-03 ENCOUNTER — Other Ambulatory Visit: Payer: Self-pay

## 2017-05-03 MED ORDER — PREGABALIN 100 MG PO CAPS: 100.0000 mg | ORAL_CAPSULE | Freq: Three times a day (TID) | ORAL | 1 refills | Status: DC

## 2017-05-03 MED FILL — VALACYCLOVIR HCL 500 MG TAB: 500 | 30 days supply | Qty: 30 | Fill #2

## 2017-05-03 MED FILL — hydrOXYzine HCL 50 MG TABS: 50 | 30 days supply | Qty: 90 | Fill #1

## 2017-05-03 MED FILL — ATORVASTATIN 80 MG TABLET: 80 | 30 days supply | Qty: 30 | Fill #1

## 2017-05-03 MED FILL — ISOSORBIDE MN ER 60 MG TAB: 60 | 30 days supply | Qty: 30 | Fill #0

## 2017-05-03 MED FILL — hydrALAZINE HCL 50 MG TABS: 50 | 30 days supply | Qty: 90 | Fill #2

## 2017-05-03 MED FILL — BRILINTA 90 MG TABLET: 90 | 30 days supply | Qty: 60 | Fill #1

## 2017-05-03 MED FILL — CARVEDILOL 25 MG TABLET: 25 | 30 days supply | Qty: 60 | Fill #2

## 2017-05-03 NOTE — Telephone Encounter (Signed)
Left second message for patient to call back.

## 2017-05-03 NOTE — Telephone Encounter (Signed)
Scheduled patient 10/24 at 1130.  Left message to call back to confirm/decline appointment slot.

## 2017-05-04 NOTE — Telephone Encounter (Signed)
Patient has been scheduled on [place] tomorrow and patient is aware of appointment per scheduling supervisor notes.

## 2017-05-05 ENCOUNTER — Encounter: Payer: Self-pay | Admitting: Cardiology

## 2017-05-05 ENCOUNTER — Ambulatory Visit (INDEPENDENT_AMBULATORY_CARE_PROVIDER_SITE_OTHER): Payer: Medicaid Other | Admitting: Cardiology

## 2017-05-05 VITALS — BP 130/70 | HR 80 | Ht 68.0 in | Wt 306.4 lb

## 2017-05-05 DIAGNOSIS — N289 Disorder of kidney and ureter, unspecified: Secondary | ICD-10-CM

## 2017-05-05 DIAGNOSIS — R0609 Other forms of dyspnea: Secondary | ICD-10-CM

## 2017-05-05 DIAGNOSIS — I2510 Atherosclerotic heart disease of native coronary artery without angina pectoris: Secondary | ICD-10-CM | POA: Diagnosis not present

## 2017-05-05 DIAGNOSIS — R0600 Dyspnea, unspecified: Secondary | ICD-10-CM

## 2017-05-05 MED ORDER — TICAGRELOR 90 MG PO TABS: 90.0000 mg | ORAL_TABLET | Freq: Two times a day (BID) | ORAL | 2 refills | Status: DC

## 2017-05-05 MED ORDER — ISOSORBIDE MONONITRATE ER 60 MG PO TB24: 60.0000 mg | ORAL_TABLET | Freq: Every day | ORAL | 6 refills | Status: DC

## 2017-05-05 NOTE — Progress Notes (Signed)
05/05/2017 [name] Tober   January 16, 1956  [id number]  Primary Physician Arnoldo Morale, MD Primary Cardiologist: Dr. Burt Knack   Reason for Visit/CC: [facility] F/u for CP and Dyspnea  HPI:  REDITH [name] is a 61 y.o. morbidly obese female with a h/o CAD s/p MI March 2018 with placement of proximal LAD stent and residual moderate LCx disease, normal EF on most recent echo 03/10/17, IDDM, HTN, HLD, CKD, Hep C and OSA on CPAP therapy.   She presents to clinic today for post hospital f/u. She has been having recent exertional dyspnea and chest pain over the last several months. CXRs have been negative. She had a V/Q scan in September that was low risk for PE. 2D echo 03/10/17 showed normal LVEF at 60-65%, G1DD and mild AI. Given worsening dyspnea and CP, she recently went to the [facility] EKG non acute. Given her history of recent stent placement, she was admitted for further w/u. Cardiac enzymes were negative for MI. BNP was WNL at 38. Dr. Burt Knack recommended repeat [facility]. This showed patient LAD stent and no obstructive disease to explain her recent symptoms. Continued medical therapy was recommended. Post cath, she had bump in SCr from baseline of 2.2 to 3.0. Dr. Stanford Breed instructed that her diuretics be held at time of discharge and advised that she have a f/u BMP at time of f/u.  She notes that she continues to have exertional dyspnea. She denies resting dyspnea. No wheezing. No cough, fever or chills. She has f/u with her PCP tomorrow.   Current Meds  Medication Sig  . acetaminophen-codeine (TYLENOL #3) 300-30 MG tablet Take 1 tablet by mouth every 12 (twelve) hours as needed for severe pain. For osteoarthritis (unable to take NSAIDs due to CKD)  . albuterol (PROVENTIL) (2.5 MG/3ML) 0.083% nebulizer solution Take 3 mLs (2.5 mg total) by nebulization every 6 (six) hours as needed for wheezing or shortness of breath.  [name] albuterol (VENTOLIN HFA) 108 (90 Base) MCG/ACT inhaler Inhale 2 puffs into the lungs  every 4 (four) hours as needed for wheezing or shortness of breath.  . allopurinol (ZYLOPRIM) 100 MG tablet Take 1 tablet (100 mg total) by mouth daily.  [name] amLODipine (NORVASC) 10 MG tablet Take 1 tablet (10 mg total) by mouth daily.  [name] aspirin EC 81 MG EC tablet Take 1 tablet (81 mg total) by mouth daily.  [name] atorvastatin (LIPITOR) 80 MG tablet Take 1 tablet (80 mg total) by mouth daily at 6 PM.  . Blood Glucose Monitoring Suppl (ACCU-CHEK AVIVA) device Use as instructed daily.  . carvedilol (COREG) 25 MG tablet TAKE 1 TABLET BY MOUTH 2 TIMES DAILY WITH A MEAL.  [name] colchicine 0.6 MG tablet Take 0.5 tablets (0.3 mg total) by mouth daily.  [name] exenatide (BYETTA 10 MCG PEN) 10 MCG/0.04ML SOPN injection Inject 0.04 mLs (10 mcg total) into the skin 2 (two) times daily with a meal.  . ferrous sulfate 325 (65 FE) MG tablet Take 1 tablet (325 mg total) by mouth 2 (two) times daily with a meal.  . fluticasone (FLONASE) 50 MCG/ACT nasal spray Place 2 sprays into both nostrils daily as needed for allergies or rhinitis.  . Fluticasone-Salmeterol (ADVAIR DISKUS) 250-50 MCG/DOSE AEPB Inhale 1 puff into the lungs 2 (two) times daily. (Patient taking differently: Inhale 1 puff into the lungs 2 (two) times daily as needed (shortness of breath). )  . furosemide (LASIX) 40 MG tablet Take 2 pills (80 mg) in the morning and 1  pill (6m) in afternoon  . glucose blood (ACCU-CHEK AVIVA) test strip Use as instructed daily  . glucose blood (TRUE METRIX BLOOD GLUCOSE TEST) test strip Use as instructed  . hydrALAZINE (APRESOLINE) 50 MG tablet Take 1 tablet (50 mg total) by mouth 3 (three) times daily.  . hydrOXYzine (ATARAX/VISTARIL) 50 MG tablet TAKE 1 TABLET (50 MG TOTAL) BY MOUTH 3 (THREE) TIMES DAILY AS NEEDED. (Patient taking differently: Take 50 mg by mouth 3 (three) times daily as needed for itching. )  . insulin aspart (NOVOLOG) 100 UNIT/ML injection 0-12 units subcutaneously 3 times daily before meals according to  sliding scale  . Insulin Glargine (LANTUS SOLOSTAR) 100 UNIT/ML Solostar Pen Inject 40 Units into the skin 2 (two) times daily. (Patient taking differently: Inject 30 Units into the skin 2 (two) times daily. )  . isosorbide mononitrate (IMDUR) 60 MG 24 hr tablet Take 1 tablet (60 mg total) by mouth daily.  .Marland Kitchenketotifen (ZADITOR) 0.025 % ophthalmic solution Place 1 drop into both eyes daily as needed (for dry eyes).  .Elmore GuiseDevices (ACCU-CHEK SOFTCLIX) lancets Use as instructed daily.  . nitroGLYCERIN (NITROSTAT) 0.4 MG SL tablet Place 1 tablet (0.4 mg total) under the tongue every 5 (five) minutes x 3 doses as needed for chest pain.  .Marland Kitchenomeprazole (PRILOSEC) 20 MG capsule Take 1 capsule (20 mg total) by mouth daily.  .Marland KitchenoxyCODONE-acetaminophen (PERCOCET/ROXICET) 5-325 MG tablet Take 1 tablet by mouth every 6 (six) hours as needed for severe pain.  . pregabalin (LYRICA) 100 MG capsule Take 1 capsule (100 mg total) by mouth 3 (three) times daily.  . ticagrelor (BRILINTA) 90 MG TABS tablet Take 1 tablet (90 mg total) by mouth 2 (two) times daily.  . valACYclovir (VALTREX) 500 MG tablet Take 1 tablet (500 mg total) by mouth daily.  . [DISCONTINUED] isosorbide mononitrate (IMDUR) 60 MG 24 hr tablet Take 1 tablet (60 mg total) by mouth daily.  . [DISCONTINUED] ticagrelor (BRILINTA) 90 MG TABS tablet Take 1 tablet (90 mg total) by mouth 2 (two) times daily.   Allergies  Allergen Reactions  . Shellfish Allergy Anaphylaxis and Swelling  . Diazepam Other (See Comments)    "felt like out of body experience"  . Morphine And Related Itching   Past Medical History:  Diagnosis Date  . Aortic stenosis    Echo 8/18: mean 13, peak 28, LVOT/AV mean velocity 0.51  . Asthma    As a child   . Bronchitis   . CAD (coronary artery disease)    a. 09/2016: 50% Ost 1st Mrg stenosis, 50% 2nd Mrg stenosis, 20% Mid-Cx, 95% Prox LAD, 40% mid-LAD, and 10% dist-LAD stenosis. Staged PCI with DES to Prox-LAD.   .Marland KitchenChronic  combined systolic and diastolic CHF (congestive heart failure) ([place]   echo 2/18: EF 55-60, normal wall motion, grade 2 diastolic dysfunction, trivial AI // echo 3/18: Septal and apical HK, EF 45-50, normal wall motion, trivial AI, mild LAE, PASP 38 // echo 8/18: EF 60-65, normal wall motion, grade 1 diastolic dysfunction, calcified aortic valve leaflets, mild aortic stenosis (mean 13, peak 28, LVOT/AV mean velocity 0.51), mild AI, moderate MAC, mild LAE, trivial TR   . Complication of anesthesia   . Diabetes mellitus Dx 1989  . Hepatitis C Dx 2013  . Hypertension Dx 1989  . Obesity   . Pancreatitis 2013  . Refusal of blood transfusions as patient is Jehovah's Witness   . Tendinitis   . Ulcer 2010  Family History  Problem Relation Age of Onset  . Colon cancer Mother   . Heart attack Other   . Heart attack Maternal Grandmother   . Hypertension Sister   . Hypertension Brother   . Diabetes Paternal Grandmother   . Breast cancer Neg Hx    Past Surgical History:  Procedure Laterality Date  . CHOLECYSTECTOMY    . CORONARY STENT INTERVENTION N/A 09/18/2016   Procedure: Coronary Stent Intervention;  Surgeon: Troy Sine, MD;  Location: [place] [facility];  Service: Cardiovascular;  Laterality: N/A;  . EYE SURGERY    . KNEE ARTHROSCOPY    . LEFT HEART CATH N/A 09/18/2016   Procedure: Left Heart Cath;  Surgeon: Troy Sine, MD;  Location: [place] [facility];  Service: Cardiovascular;  Laterality: N/A;  . LEFT HEART CATH AND CORONARY ANGIOGRAPHY N/A 09/16/2016   Procedure: Left Heart Cath and Coronary Angiography;  Surgeon: Burnell Blanks, MD;  Location: [place] [facility];  Service: Cardiovascular;  Laterality: N/A;  . LEFT HEART CATH AND CORONARY ANGIOGRAPHY N/A 04/29/2017   Procedure: LEFT HEART CATH AND CORONARY ANGIOGRAPHY;  Surgeon: Nelva Bush, MD;  Location: [place] [facility];  Service: Cardiovascular;  Laterality: N/A;  . TUBAL LIGATION    . TUBAL LIGATION      Social History   Social History  . Marital status: Divorced    Spouse name: N/A  . Number of children: N/A  . Years of education: N/A   Occupational History  . Not on file.   Social History Main Topics  . Smoking status: Former Smoker    Quit date: 10/25/1980  . Smokeless tobacco: Never Used     Comment: quit smoking in 1982  . Alcohol use Yes     Comment: 3 times in last year  . Drug use: No     Comment: 08/21/2016 "clean since 05/1998"  . Sexual activity: Not on file     Comment: Not asked   Other Topics Concern  . Not on file   Social History Narrative  . No narrative on file     Review of Systems: General: negative for chills, fever, night sweats or weight changes.  Cardiovascular: negative for chest pain, dyspnea on exertion, edema, orthopnea, palpitations, paroxysmal nocturnal dyspnea or shortness of breath Dermatological: negative for rash Respiratory: negative for cough or wheezing Urologic: negative for hematuria Abdominal: negative for nausea, vomiting, diarrhea, bright red blood per rectum, melena, or hematemesis Neurologic: negative for visual changes, syncope, or dizziness All other systems reviewed and are otherwise negative except as noted above.   Physical Exam:  Blood pressure 130/70, pulse 80, height _0  (1.727 m), weight (!) 306 lb 6.4 oz (139 kg), SpO2 96 %.  General appearance: alert, cooperative and no distress, morbidly obese  Neck: no carotid bruit and no JVD Lungs: clear to auscultation bilaterally Heart: regular rate and rhythm, S1, S2 normal, no murmur, click, rub or gallop Extremities: extremities normal, atraumatic, no cyanosis or edema Pulses: 2+ and symmetric Skin: Skin color, texture, turgor normal. No rashes or lesions Neurologic: Grossly normal  EKG not performed -- personally reviewed   ASSESSMENT AND PLAN:   1. Chronic Exertional Dyspnea: recent LHC showed patient LAD stent and no obstructive coronary disease to explain  her symptoms. Recent 2D echo with normal EF and only mild AI. Recent V/Q scan was negative for PE. CXRs also negative.Suspect her exertional dyspnea is likely related to obesity. No further cardiac w/u at  this time.  She has f/u with PCP tomorrow. Can look for other causes.   2. CAD: h/o MI 09/2016 s/p LAD stent. F/u LHC 04/2017 showed patient LAD stent and no obstructive CAD. Continue medical therapy. DAPT with ASA + Brilinta, statin, nitrate, BB and CCB.  3. IDDM: per PCP.   4. HTN: controlled on current regimen.   5. HLD: on statin therapy with Lipitor.   6. Acute on Chronic Kidney Disease: has CKD at baseline, however bump in SCr post cath from baseline of 2.2 to 3.0. Diuretics have been on hold. We will check f/u BMP today to reassess renal function.     Follow-Up w/ Dr. Burt Knack in 3 months.   Leta Bucklin Ladoris Gene, [facility] 05/05/2017 12:24 PM

## 2017-05-05 NOTE — Patient Instructions (Addendum)
Medication Instructions: Your physician recommends that you continue on your current medications as directed. Please refer to the Current Medication list given to you today.  Labwork: Your physician recommends that you have lab work today: BMET   Procedures/Testing: None Ordered  Follow-Up: Your physician recommends that you schedule a follow-up appointment in March 2019 with Dr. Burt Knack.   If you need a refill on your cardiac medications before your next appointment, please call your pharmacy.

## 2017-05-06 ENCOUNTER — Ambulatory Visit: Payer: Medicaid Other | Admitting: Family Medicine

## 2017-05-06 LAB — BASIC METABOLIC PANEL
BUN/Creatinine Ratio: 32 — ABNORMAL HIGH (ref 9–23)
BUN: 57 mg/dL — ABNORMAL HIGH (ref 6–24)
CO2: 23 mmol/L (ref 20–29)
Calcium: 9 mg/dL (ref 8.7–10.2)
Chloride: 104 mmol/L (ref 96–106)
Creatinine, Ser: 1.8 mg/dL — ABNORMAL HIGH (ref 0.57–1.00)
GFR calc Af Amer: 35 mL/min/{1.73_m2} — ABNORMAL LOW (ref >59)
GFR calc non Af Amer: 30 mL/min/{1.73_m2} — ABNORMAL LOW (ref >59)
Glucose: 186 mg/dL — ABNORMAL HIGH (ref 65–99)
Potassium: 5 mmol/L (ref 3.5–5.2)
Sodium: 141 mmol/L (ref 134–144)

## 2017-05-07 MED FILL — BYETTA 10 MCG DOSE PEN INJ: 10 | 30 days supply | Qty: 2 | Fill #0

## 2017-05-07 MED FILL — NovoLOG 100 UNIT/ML SOLN: 100 | 27 days supply | Qty: 10 | Fill #0

## 2017-05-10 ENCOUNTER — Encounter: Payer: Self-pay | Admitting: Pharmacist

## 2017-05-10 ENCOUNTER — Other Ambulatory Visit: Payer: Self-pay

## 2017-05-10 MED ORDER — GABAPENTIN 100 MG PO CAPS: ORAL_CAPSULE | ORAL | 1 refills | Status: DC

## 2017-05-10 MED FILL — GABAPENTIN 100 MG CAPSULE: 100 | 30 days supply | Qty: 180 | Fill #0

## 2017-05-10 NOTE — Progress Notes (Signed)
Prior authorization completed and approved for Lyrica. Prior approval # [id number]

## 2017-05-12 ENCOUNTER — Telehealth: Payer: Self-pay | Admitting: Cardiology

## 2017-05-12 DIAGNOSIS — I2510 Atherosclerotic heart disease of native coronary artery without angina pectoris: Secondary | ICD-10-CM

## 2017-05-12 MED ORDER — FUROSEMIDE 40 MG PO TABS: 40.0000 mg | ORAL_TABLET | Freq: Every day | ORAL | 3 refills | Status: DC

## 2017-05-12 NOTE — Telephone Encounter (Signed)
Patient made aware of results. Instructed patient to start Lasix 40mg  daily. Patient is scheduled for BMET on 11/9. Patient verbalized understanding and thanked me for the call.

## 2017-05-12 NOTE — Telephone Encounter (Signed)
Follow Up:      Returning your call from today,concerning her lab results.

## 2017-05-12 NOTE — Telephone Encounter (Signed)
-----   Message from Consuelo Pandy, [place] sent at 05/11/2017  2:30 PM EDT ----- Kidney function is a lot better post cath. Down from 3>> 1.8. Her lasix has been on hold, but we need to see if we can resume this to prevent fluid build up. Start low, 40 mg daily. We will need to recheck levels again after restarting Lasix. Check f/u BMP 7-10 days after pt restarts lasix.

## 2017-05-14 ENCOUNTER — Ambulatory Visit: Payer: Self-pay | Admitting: Podiatry

## 2017-05-21 ENCOUNTER — Other Ambulatory Visit: Payer: Medicaid Other | Admitting: *Deleted

## 2017-05-21 DIAGNOSIS — I2510 Atherosclerotic heart disease of native coronary artery without angina pectoris: Secondary | ICD-10-CM

## 2017-05-21 LAB — BASIC METABOLIC PANEL
BUN/Creatinine Ratio: 27 — ABNORMAL HIGH (ref 9–23)
BUN: 57 mg/dL — ABNORMAL HIGH (ref 6–24)
CO2: 20 mmol/L (ref 20–29)
Calcium: 8.4 mg/dL — ABNORMAL LOW (ref 8.7–10.2)
Chloride: 102 mmol/L (ref 96–106)
Creatinine, Ser: 2.14 mg/dL — ABNORMAL HIGH (ref 0.57–1.00)
GFR calc Af Amer: 28 mL/min/{1.73_m2} — ABNORMAL LOW (ref >59)
GFR calc non Af Amer: 25 mL/min/{1.73_m2} — ABNORMAL LOW (ref >59)
Glucose: 226 mg/dL — ABNORMAL HIGH (ref 65–99)
Potassium: 4 mmol/L (ref 3.5–5.2)
Sodium: 140 mmol/L (ref 134–144)

## 2017-05-24 ENCOUNTER — Encounter (HOSPITAL_COMMUNITY): Payer: Self-pay | Admitting: Emergency Medicine

## 2017-05-24 ENCOUNTER — Emergency Department (HOSPITAL_COMMUNITY)
Admission: EM | Admit: 2017-05-24 | Discharge: 2017-05-25 | Disposition: A | Discharge status: Home / Self Care | Payer: Medicaid Other | Attending: Emergency Medicine | Admitting: Emergency Medicine

## 2017-05-24 ENCOUNTER — Other Ambulatory Visit: Payer: Self-pay

## 2017-05-24 DIAGNOSIS — I509 Heart failure, unspecified: Secondary | ICD-10-CM | POA: Insufficient documentation

## 2017-05-24 DIAGNOSIS — Z794 Long term (current) use of insulin: Secondary | ICD-10-CM | POA: Insufficient documentation

## 2017-05-24 DIAGNOSIS — Y929 Unspecified place or not applicable: Secondary | ICD-10-CM | POA: Diagnosis not present

## 2017-05-24 DIAGNOSIS — Y9389 Activity, other specified: Secondary | ICD-10-CM | POA: Diagnosis not present

## 2017-05-24 DIAGNOSIS — J45909 Unspecified asthma, uncomplicated: Secondary | ICD-10-CM | POA: Diagnosis not present

## 2017-05-24 DIAGNOSIS — Y999 Unspecified external cause status: Secondary | ICD-10-CM | POA: Insufficient documentation

## 2017-05-24 DIAGNOSIS — S7012XA Contusion of left thigh, initial encounter: Secondary | ICD-10-CM

## 2017-05-24 DIAGNOSIS — Z79899 Other long term (current) drug therapy: Secondary | ICD-10-CM | POA: Diagnosis not present

## 2017-05-24 DIAGNOSIS — I2510 Atherosclerotic heart disease of native coronary artery without angina pectoris: Secondary | ICD-10-CM | POA: Diagnosis not present

## 2017-05-24 DIAGNOSIS — Z87891 Personal history of nicotine dependence: Secondary | ICD-10-CM | POA: Insufficient documentation

## 2017-05-24 DIAGNOSIS — S79922A Unspecified injury of left thigh, initial encounter: Secondary | ICD-10-CM | POA: Diagnosis present

## 2017-05-24 DIAGNOSIS — I130 Hypertensive heart and chronic kidney disease with heart failure and stage 1 through stage 4 chronic kidney disease, or unspecified chronic kidney disease: Secondary | ICD-10-CM | POA: Diagnosis not present

## 2017-05-24 DIAGNOSIS — E1140 Type 2 diabetes mellitus with diabetic neuropathy, unspecified: Secondary | ICD-10-CM | POA: Diagnosis not present

## 2017-05-24 DIAGNOSIS — N184 Chronic kidney disease, stage 4 (severe): Secondary | ICD-10-CM | POA: Diagnosis not present

## 2017-05-24 DIAGNOSIS — X58XXXA Exposure to other specified factors, initial encounter: Secondary | ICD-10-CM | POA: Insufficient documentation

## 2017-05-24 DIAGNOSIS — D631 Anemia in chronic kidney disease: Secondary | ICD-10-CM | POA: Insufficient documentation

## 2017-05-24 MED ORDER — OXYCODONE-ACETAMINOPHEN 5-325 MG PO TABS
1.0000 | ORAL_TABLET | Freq: Once | ORAL | Status: AC
  Administered 2017-05-25: 1 via ORAL
  Filled 2017-05-24: qty 1

## 2017-05-24 MED ORDER — OXYCODONE-ACETAMINOPHEN 5-325 MG PO TABS
1.0000 | ORAL_TABLET | Freq: Once | ORAL | Status: AC
  Administered 2017-05-24: 1 via ORAL
  Filled 2017-05-24: qty 1

## 2017-05-24 MED ORDER — DOXYCYCLINE HYCLATE 100 MG PO TABS
100.0000 mg | ORAL_TABLET | Freq: Once | ORAL | Status: AC
  Administered 2017-05-25: 100 mg via ORAL
  Filled 2017-05-24: qty 1

## 2017-05-24 MED ORDER — LIDOCAINE-EPINEPHRINE (PF) 2 %-1:200000 IJ SOLN
20.0000 mL | Freq: Once | INTRAMUSCULAR | Status: AC
  Administered 2017-05-24: 20 mL
  Filled 2017-05-24: qty 20

## 2017-05-24 MED FILL — FUROSEMIDE 40 MG TAB: 40 | 30 days supply | Qty: 90 | Fill #2

## 2017-05-24 MED FILL — AMLODIPINE BESYLATE 10 MG T: 10 | 30 days supply | Qty: 30 | Fill #1

## 2017-05-24 MED FILL — FERROUS SULFATE 325 MG TAB: 325 (65 FE) | 30 days supply | Qty: 60 | Fill #1

## 2017-05-24 MED FILL — OMEPRAZOLE DR 20 MG CAPSULE: 20 | 30 days supply | Qty: 30 | Fill #1

## 2017-05-24 MED FILL — COLCHICINE 0.6 MG TABS: 0.6 | 30 days supply | Qty: 30 | Fill #1

## 2017-05-24 NOTE — ED Triage Notes (Signed)
Pt reports that she has a swollen area to left thigh that started on 05/22/17. Pt reports pain when trying to ambulate and that she has hx of DM and felt like when she took her insulin that it didn't go in deep enough.

## 2017-05-25 MED ORDER — HYDROCODONE-ACETAMINOPHEN 5-325 MG PO TABS: 1.0000 | ORAL_TABLET | ORAL | 0 refills | Status: DC | PRN

## 2017-05-25 MED ORDER — DOCUSATE SODIUM 100 MG PO CAPS: 100.0000 mg | ORAL_CAPSULE | Freq: Two times a day (BID) | ORAL | 0 refills | Status: DC

## 2017-05-25 MED ORDER — DOXYCYCLINE HYCLATE 100 MG PO CAPS: 100.0000 mg | ORAL_CAPSULE | Freq: Two times a day (BID) | ORAL | 0 refills | Status: DC

## 2017-05-25 NOTE — ED Notes (Signed)
Dressing placed on wound.

## 2017-05-26 NOTE — ED Provider Notes (Signed)
[facility] DEPT Provider Note   CSN: [id number] Arrival date & time: 05/24/17  2132     History   Chief Complaint Chief Complaint  Patient presents with  . Leg Swelling    HPI [name] is a 61 y.o. female.  HPI Patient is a 61 year old female with a history of diabetes who presents the emergency department with complaints of left mid medial thigh discomfort and pain with a small area of swelling and redness.  She states this is the area where she injects her insulin.  Daughter reports that she does not grab her subcutaneous tissue and inject instead she gives herself more of an intramuscular style injection.  Patient states after her last injection she developed some swelling in the area and now is having increasing pain and redness to the region.  No significant spreading erythema.  Denies fevers and chills.  Denies nausea vomiting.  Denies new weakness of her arms or legs.  No other injury or trauma to her left leg.  Symptoms are moderate to severe in severity and worse with palpation.  There is been no drainage.   Past Medical History:  Diagnosis Date  . Aortic stenosis    Echo 8/18: mean 13, peak 28, LVOT/AV mean velocity 0.51  . Asthma    As a child   . Bronchitis   . CAD (coronary artery disease)    a. 09/2016: 50% Ost 1st Mrg stenosis, 50% 2nd Mrg stenosis, 20% Mid-Cx, 95% Prox LAD, 40% mid-LAD, and 10% dist-LAD stenosis. Staged PCI with DES to Prox-LAD.   [name] Chronic combined systolic and diastolic CHF (congestive heart failure) ([place]) 2011   echo 2/18: EF 55-60, normal wall motion, grade 2 diastolic dysfunction, trivial AI // echo 3/18: Septal and apical HK, EF 45-50, normal wall motion, trivial AI, mild LAE, PASP 38 // echo 8/18: EF 60-65, normal wall motion, grade 1 diastolic dysfunction, calcified aortic valve leaflets, mild aortic stenosis (mean 13, peak 28, LVOT/AV mean velocity 0.51), mild AI, moderate MAC, mild LAE, trivial TR   .  Complication of anesthesia   . Diabetes mellitus Dx 1989  . Hepatitis C Dx 2013  . Hypertension Dx 1989  . Obesity   . Pancreatitis 2013  . Refusal of blood transfusions as patient is Jehovah's Witness   . Tendinitis   . Ulcer 2010    Patient Active Problem List   Diagnosis Date Noted  . Chest pain 04/29/2017  . Chest pain with moderate risk of acute coronary syndrome 04/28/2017  . Acute on chronic combined systolic and diastolic CHF, NYHA class 4 ([place]) 03/19/2017  . Unstable angina ([place]) 03/17/2017  . Aortic stenosis   . Herpes simplex infection of genitourinary system 02/12/2017  . Chronic pain of right knee 02/12/2017  . Chronic gout due to renal impairment of multiple sites without tophus 02/12/2017  . Esophageal dysphagia 02/12/2017  . Osteoarthritis 01/22/2017  . Diabetic hyperosmolar non-ketotic state ([place]) 12/13/2016  . CAD (coronary artery disease) 12/13/2016  . Hyperlipidemia 12/13/2016  . Hyperphosphatemia 12/13/2016  . History of non-ST elevation myocardial infarction (NSTEMI) 09/13/2016  . Acute myopericarditis   . Injury of left hand 02/24/2016  . Asthma 11/29/2015  . Trigger middle finger 11/25/2015  . Depression 09/05/2015  . GERD (gastroesophageal reflux disease) 03/25/2015  . Precordial pain   . Environmental allergies 03/14/2015  . Hep C w/o coma, chronic ([place]) 01/31/2015  . Diabetic neuropathy ([place]) 01/31/2015  . OSA (obstructive sleep apnea) 01/01/2015  .  Poor dentition 11/13/2014  . Essential hypertension 10/08/2014  . Obesity 10/08/2014  . CKD (chronic kidney disease) stage 4, GFR 15-29 ml/min ([facility]) 10/08/2014  . Diabetes mellitus type 2, uncontrolled, with complications ([place]) 16/04/9603    Class: Chronic  . Anemia in stage 3 chronic kidney disease ([place]) 03/25/2012  . Chronic combined systolic and diastolic CHF (congestive heart failure) () 07/13/2009    Past Surgical History:  Procedure Laterality Date  . CHOLECYSTECTOMY    . EYE SURGERY     . KNEE ARTHROSCOPY    . TUBAL LIGATION    . TUBAL LIGATION      OB History    No data available       Home Medications    Prior to Admission medications   Medication Sig Start Date End Date Taking? Authorizing Provider  acetaminophen-codeine (TYLENOL #3) 300-30 MG tablet Take 1 tablet by mouth every 12 (twelve) hours as needed for severe pain. For osteoarthritis (unable to take NSAIDs due to CKD) 04/14/17  Yes Amao, Charlane Ferretti, MD  albuterol (PROVENTIL) (2.5 MG/3ML) 0.083% nebulizer solution Take 3 mLs (2.5 mg total) by nebulization every 6 (six) hours as needed for wheezing or shortness of breath. 12/17/16  Yes Argentina Donovan, PA-C  albuterol (VENTOLIN HFA) 108 (90 Base) MCG/ACT inhaler Inhale 2 puffs into the lungs every 4 (four) hours as needed for wheezing or shortness of breath. 08/03/16  Yes Funches, Josalyn, MD  allopurinol (ZYLOPRIM) 100 MG tablet Take 1 tablet (100 mg total) by mouth daily. 02/12/17  Yes Funches, Josalyn, MD  amLODipine (NORVASC) 10 MG tablet Take 1 tablet (10 mg total) by mouth daily. 04/14/17  Yes Arnoldo Morale, MD  aspirin EC 81 MG EC tablet Take 1 tablet (81 mg total) by mouth daily. 09/19/16  Yes Strader, [place] M, PA-C  atorvastatin (LIPITOR) 80 MG tablet Take 1 tablet (80 mg total) by mouth daily at 6 PM. 02/12/17  Yes Funches, Josalyn, MD  carvedilol (COREG) 25 MG tablet TAKE 1 TABLET BY MOUTH 2 TIMES DAILY WITH A MEAL. 02/12/17  Yes Funches, Josalyn, MD  colchicine 0.6 MG tablet Take 0.5 tablets (0.3 mg total) by mouth daily. 05/03/17  Yes Velna Hatchet, MD  exenatide (BYETTA 10 MCG PEN) 10 MCG/0.04ML SOPN injection Inject 0.04 mLs (10 mcg total) into the skin 2 (two) times daily with a meal. 04/30/17  Yes Amao, Charlane Ferretti, MD  ferrous sulfate 325 (65 FE) MG tablet Take 1 tablet (325 mg total) by mouth 2 (two) times daily with a meal. 04/14/17  Yes Amao, Enobong, MD  fluticasone (FLONASE) 50 MCG/ACT nasal spray Place 2 sprays into both nostrils daily as needed for  allergies or rhinitis. 01/07/17  Yes Funches, Josalyn, MD  Fluticasone-Salmeterol (ADVAIR DISKUS) 250-50 MCG/DOSE AEPB Inhale 1 puff into the lungs 2 (two) times daily. Patient taking differently: Inhale 1 puff into the lungs 2 (two) times daily as needed (shortness of breath).  12/17/16  Yes Argentina Donovan, PA-C  furosemide (LASIX) 40 MG tablet Take 1 tablet (40 mg total) by mouth daily. 05/12/17 05/12/18 Yes Simmons, Brittainy M, PA-C  gabapentin (NEURONTIN) 100 MG capsule TAKE 2 CAPSULES BY MOUTH 3 TIMES A DAY. 05/10/17  Yes Arnoldo Morale, MD  hydrALAZINE (APRESOLINE) 50 MG tablet Take 1 tablet (50 mg total) by mouth 3 (three) times daily. 02/12/17  Yes Funches, Josalyn, MD  hydrOXYzine (ATARAX/VISTARIL) 50 MG tablet TAKE 1 TABLET (50 MG TOTAL) BY MOUTH 3 (THREE) TIMES DAILY AS NEEDED. Patient taking differently: Take  50 mg by mouth 3 (three) times daily as needed for itching.  03/02/17  Yes Jegede, Olugbemiga E, MD  insulin aspart (NOVOLOG) 100 UNIT/ML injection 0-12 units subcutaneously 3 times daily before meals according to sliding scale 04/14/17  Yes Amao, Charlane Ferretti, MD  Insulin Glargine (LANTUS SOLOSTAR) 100 UNIT/ML Solostar Pen Inject 40 Units into the skin 2 (two) times daily. Patient taking differently: Inject 30 Units into the skin 2 (two) times daily.  04/14/17  Yes Arnoldo Morale, MD  isosorbide mononitrate (IMDUR) 60 MG 24 hr tablet Take 1 tablet (60 mg total) by mouth daily. 05/05/17  Yes Lyda Jester M, PA-C  ketotifen (ZADITOR) 0.025 % ophthalmic solution Place 1 drop into both eyes daily as needed (for dry eyes). 07/27/16  Yes Funches, Josalyn, MD  nitroGLYCERIN (NITROSTAT) 0.4 MG SL tablet Place 1 tablet (0.4 mg total) under the tongue every 5 (five) minutes x 3 doses as needed for chest pain. 03/19/17  Yes Barrett, Evelene Croon, PA-C  omeprazole (PRILOSEC) 20 MG capsule Take 1 capsule (20 mg total) by mouth daily. 04/14/17  Yes Arnoldo Morale, MD  oxyCODONE-acetaminophen (PERCOCET/ROXICET)  5-325 MG tablet Take 1 tablet by mouth every 6 (six) hours as needed for severe pain. 04/11/17  Yes Lawyer, Harrell Gave, PA-C  pregabalin (LYRICA) 100 MG capsule Take 1 capsule (100 mg total) by mouth 3 (three) times daily. 05/03/17  Yes Arnoldo Morale, MD  ticagrelor (BRILINTA) 90 MG TABS tablet Take 1 tablet (90 mg total) by mouth 2 (two) times daily. 05/05/17  Yes Lyda Jester M, PA-C  valACYclovir (VALTREX) 500 MG tablet Take 1 tablet (500 mg total) by mouth daily. 04/14/17  Yes Arnoldo Morale, MD  Blood Glucose Monitoring Suppl (ACCU-CHEK AVIVA) device Use as instructed daily. 04/14/17   Arnoldo Morale, MD  docusate sodium (COLACE) 100 MG capsule Take 1 capsule (100 mg total) every 12 (twelve) hours by mouth. 05/25/17   Jola Schmidt, MD  doxycycline (VIBRAMYCIN) 100 MG capsule Take 1 capsule (100 mg total) 2 (two) times daily by mouth. 05/24/17   Jola Schmidt, MD  glucose blood (ACCU-CHEK AVIVA) test strip Use as instructed daily 04/14/17   Arnoldo Morale, MD  glucose blood (TRUE METRIX BLOOD GLUCOSE TEST) test strip Use as instructed 03/19/17   Barrett, Evelene Croon, PA-C  HYDROcodone-acetaminophen (NORCO/VICODIN) 5-325 MG tablet Take 1 tablet every 4 (four) hours as needed by mouth for moderate pain. 05/24/17   Jola Schmidt, MD  Lancet Devices [facility]) lancets Use as instructed daily. 04/14/17   Arnoldo Morale, MD    Family History Family History  Problem Relation Age of Onset  . Colon cancer Mother   . Heart attack Other   . Heart attack Maternal Grandmother   . Hypertension Sister   . Hypertension Brother   . Diabetes Paternal Grandmother   . Breast cancer Neg Hx     Social History Social History   Tobacco Use  . Smoking status: Former Smoker    Last attempt to quit: 10/25/1980    Years since quitting: 36.6  . Smokeless tobacco: Never Used  . Tobacco comment: quit smoking in 1982  Substance Use Topics  . Alcohol use: Yes    Comment: 3 times in last year  . Drug use:  No    Comment: 08/21/2016 "clean since 05/1998"     Allergies   Shellfish allergy; Diazepam; and Morphine and related   Review of Systems Review of Systems  All other systems reviewed and are negative.  Physical Exam Updated Vital Signs BP (!) 159/69 (BP Location: Left Arm)   Pulse 94   Temp 99.8 F (37.7 C) (Oral)   Resp 18   Ht _0  (1.727 m)   Wt 132.5 kg (292 lb)   SpO2 98%   BMI 44.40 kg/m   Physical Exam  Constitutional: She is oriented to person, place, and time. She appears well-developed and well-nourished.  HENT:  Head: Normocephalic.  Eyes: EOM are normal.  Neck: Normal range of motion.  Pulmonary/Chest: Effort normal.  Abdominal: She exhibits no distension.  Musculoskeletal: Normal range of motion.  Left mid medial thigh with a small area of swelling and erythema without drainage.  Small fluctuance.  No crepitus.  Neurological: She is alert and oriented to person, place, and time.  Psychiatric: She has a normal mood and affect.  Nursing note and vitals reviewed.    ED Treatments / Results  Labs (all labs ordered are listed, but only abnormal results are displayed) Labs Reviewed - No data to display  EKG  EKG Interpretation None       Radiology No results found.    +++++++++++++++++++++++++++++++++   Procedures .Marland KitchenIncision and Drainage Performed by: Jola Schmidt, MD Authorized by: Jola Schmidt, MD     Consent: Verbal consent obtained. Risks and benefits: risks, benefits and alternatives were discussed Time out performed prior to procedure Type: abscess Body area: left medial thigh Anesthesia: local infiltration Incision was made with a scalpel. Local anesthetic: lidocaine 2% withepinephrine Anesthetic total: 5 ml Complexity: complex Blunt dissection to break up loculations Drainage: old hematoma Drainage amount: small Packing material: none Patient tolerance: Patient tolerated the procedure well with no immediate  complications.    EMERGENCY DEPARTMENT US SOFT TISSUE INTERPRETATION "Study: Limited Soft Tissue Ultrasound" INDICATIONS: Soft tissue infection Multiple views of the body part were obtained in real-time with a multi-frequency linear probe PERFORMED BY: Myself IMAGES ARCHIVED?: Yes SIDE:Left BODY PART:Lower extremity INTERPRETATION:  Abcess present  ++++++++++++++++++++++++++++++++++    Medications Ordered in ED Medications  oxyCODONE-acetaminophen (PERCOCET/ROXICET) 5-325 MG per tablet 1 tablet (1 tablet Oral Given 05/24/17 2224)  lidocaine-EPINEPHrine (XYLOCAINE W/EPI) 2 %-1:200000 (PF) injection 20 mL (20 mLs Infiltration Given by Other 05/24/17 2325)  doxycycline (VIBRA-TABS) tablet 100 mg (100 mg Oral Given 05/25/17 0012)  oxyCODONE-acetaminophen (PERCOCET/ROXICET) 5-325 MG per tablet 1 tablet (1 tablet Oral Given 05/25/17 0012)     Initial Impression / Assessment and Plan / ED Course  I have reviewed the triage vital signs and the nursing notes.  Pertinent labs & imaging results that were available during my care of the patient were reviewed by me and considered in my medical decision making (see chart for details).     Suspected abscess of the left medial thigh.  Upon incision and drainage of what came out was a small amount of old blood without significant purulent material.  I suspect this was an isolated hematoma but now is starting to become secondarily infected given the erythema and warmth.  Patient will be started on antibiotics.  No indication for packing placement.  Doubt deep space infection otherwise.  I was able to localize the fluid collection on ultrasound and was able to express this area.  This was verified with hemostats noted on ultrasound.  Patient understands return to the emergency department for new or worsening symptoms.  Final Clinical Impressions(s) / ED Diagnoses   Final diagnoses:  Hematoma of left thigh, initial encounter    ED Discharge  Orders  Ordered    docusate sodium (COLACE) 100 MG capsule  Every 12 hours     05/25/17 0000    doxycycline (VIBRAMYCIN) 100 MG capsule  2 times daily     05/25/17 0000    HYDROcodone-acetaminophen (NORCO/VICODIN) 5-325 MG tablet  Every 4 hours PRN     05/25/17 0000       Jola Schmidt, MD 05/26/17 1027

## 2017-05-28 ENCOUNTER — Ambulatory Visit: Payer: Medicaid Other | Attending: Family Medicine | Admitting: Family Medicine

## 2017-05-28 VITALS — BP 160/84 | HR 89 | Temp 99.4°F | Ht 68.0 in

## 2017-05-28 DIAGNOSIS — Z9851 Tubal ligation status: Secondary | ICD-10-CM | POA: Diagnosis not present

## 2017-05-28 DIAGNOSIS — I5042 Chronic combined systolic (congestive) and diastolic (congestive) heart failure: Secondary | ICD-10-CM

## 2017-05-28 DIAGNOSIS — Z794 Long term (current) use of insulin: Secondary | ICD-10-CM | POA: Diagnosis not present

## 2017-05-28 DIAGNOSIS — IMO0002 Reserved for concepts with insufficient information to code with codable children: Secondary | ICD-10-CM

## 2017-05-28 DIAGNOSIS — Z91013 Allergy to seafood: Secondary | ICD-10-CM | POA: Insufficient documentation

## 2017-05-28 DIAGNOSIS — Z9889 Other specified postprocedural states: Secondary | ICD-10-CM | POA: Insufficient documentation

## 2017-05-28 DIAGNOSIS — Z9049 Acquired absence of other specified parts of digestive tract: Secondary | ICD-10-CM | POA: Insufficient documentation

## 2017-05-28 DIAGNOSIS — E118 Type 2 diabetes mellitus with unspecified complications: Secondary | ICD-10-CM

## 2017-05-28 DIAGNOSIS — E1165 Type 2 diabetes mellitus with hyperglycemia: Secondary | ICD-10-CM | POA: Insufficient documentation

## 2017-05-28 DIAGNOSIS — Z7982 Long term (current) use of aspirin: Secondary | ICD-10-CM | POA: Insufficient documentation

## 2017-05-28 DIAGNOSIS — S7012XA Contusion of left thigh, initial encounter: Secondary | ICD-10-CM | POA: Insufficient documentation

## 2017-05-28 DIAGNOSIS — Z79899 Other long term (current) drug therapy: Secondary | ICD-10-CM | POA: Insufficient documentation

## 2017-05-28 DIAGNOSIS — I110 Hypertensive heart disease with heart failure: Secondary | ICD-10-CM | POA: Insufficient documentation

## 2017-05-28 DIAGNOSIS — I10 Essential (primary) hypertension: Secondary | ICD-10-CM | POA: Diagnosis not present

## 2017-05-28 LAB — POCT GLYCOSYLATED HEMOGLOBIN (HGB A1C): Hemoglobin A1C: 9.2

## 2017-05-28 LAB — GLUCOSE, POCT (MANUAL RESULT ENTRY)
POC Glucose: 552 mg/dl — AB (ref 70–99)
POC Glucose: 514 mg/dl — AB (ref 70–99)

## 2017-05-28 MED ORDER — KETOROLAC TROMETHAMINE 60 MG/2ML IM SOLN
60.0000 mg | Freq: Once | INTRAMUSCULAR | Status: AC
  Administered 2017-05-28: 60 mg via INTRAMUSCULAR

## 2017-05-28 MED ORDER — INSULIN ASPART 100 UNIT/ML ~~LOC~~ SOLN
30.0000 [IU] | Freq: Once | SUBCUTANEOUS | Status: AC
  Administered 2017-05-28: 30 [IU] via SUBCUTANEOUS

## 2017-05-28 MED FILL — ISOSORBIDE MN ER 60 MG TAB: 60 | 30 days supply | Qty: 30 | Fill #1

## 2017-05-28 NOTE — Progress Notes (Signed)
Subjective:  Patient ID: [name], female    DOB: [birth year]  Age: 61 y.o. MRN: [id number]  CC: Diabetes and Knee Pain   HPI [name] is a 61 year old female with a history of uncontrolled type 2 diabetes mellitus (A1c 9.8) hypertension, CHF (2d echo from 02/2017, EF 60-65%, no regional wall motion abnormalities and grade 1 diastolic dysfunction ), CAD (s/p staged PCI with DES to proximal LAD currently on dual antiplatelet therapy with aspirin and Brilinta), asthma, CK D stage III, anemia, GERD here for a follow-up visit.  She was seen at the ED three days ago for a left thigh hematoma s/p incision with bloody drainage. This was thought to be secondary to a complication of intramuscular rather than subcutaneous administration of insulin. She was placed on Vicodin and Doxycycline.  She complains of severe left thigh pain, reduced appetite and a result has not been taking her diabetes or hypertensive medications. Denies fever. She has been taking her antibiotics and pain pills and performing daily wound dressing changes.   She complains of dyspnea; cardiac cath from 04/29/17 revealed widely patent mid LAD stent, no significant change in mild to moderate LCx, OM, diagonal disease compared with 09/2016, normal filling pressure. At her last visit with Cardiology on 05/05/17 dyspnea was thought to be weight related.  Past Medical History:  Diagnosis Date  . Aortic stenosis    Echo 8/18: mean 13, peak 28, LVOT/AV mean velocity 0.51  . Asthma    As a child   . Bronchitis   . CAD (coronary artery disease)    a. 09/2016: 50% Ost 1st Mrg stenosis, 50% 2nd Mrg stenosis, 20% Mid-Cx, 95% Prox LAD, 40% mid-LAD, and 10% dist-LAD stenosis. Staged PCI with DES to Prox-LAD.   [name] Chronic combined systolic and diastolic CHF (congestive heart failure) ([place]) 2011   echo 2/18: EF 55-60, normal wall motion, grade 2 diastolic dysfunction, trivial AI // echo 3/18: Septal and apical HK, EF 45-50, normal  wall motion, trivial AI, mild LAE, PASP 38 // echo 8/18: EF 60-65, normal wall motion, grade 1 diastolic dysfunction, calcified aortic valve leaflets, mild aortic stenosis (mean 13, peak 28, LVOT/AV mean velocity 0.51), mild AI, moderate MAC, mild LAE, trivial TR   . Complication of anesthesia   . Diabetes mellitus Dx 1989  . Hepatitis C Dx 2013  . Hypertension Dx 1989  . Obesity   . Pancreatitis 2013  . Refusal of blood transfusions as patient is Jehovah's Witness   . Tendinitis   . Ulcer 2010    Past Surgical History:  Procedure Laterality Date  . CHOLECYSTECTOMY    . Coronary Stent Intervention N/A 09/18/2016   Performed by Troy Sine, MD at [facility]  . EYE SURGERY    . KNEE ARTHROSCOPY    . Left Heart Cath N/A 09/18/2016   Performed by Troy Sine, MD at [facility]  . LEFT HEART CATH AND CORONARY ANGIOGRAPHY N/A 04/29/2017   Performed by Nelva Bush, MD at [facility]  . Left Heart Cath and Coronary Angiography N/A 09/16/2016   Performed by Burnell Blanks, MD at [facility]  . TUBAL LIGATION    . TUBAL LIGATION      Allergies  Allergen Reactions  . Shellfish Allergy Anaphylaxis and Swelling  . Diazepam Other (See Comments)    "felt like out of body experience"  . Morphine And Related Itching  Outpatient Medications Prior to Visit  Medication Sig Dispense Refill  . acetaminophen-codeine (TYLENOL #3) 300-30 MG tablet Take 1 tablet by mouth every 12 (twelve) hours as needed for severe pain. For osteoarthritis (unable to take NSAIDs due to CKD) 30 tablet 1  . albuterol (PROVENTIL) (2.5 MG/3ML) 0.083% nebulizer solution Take 3 mLs (2.5 mg total) by nebulization every 6 (six) hours as needed for wheezing or shortness of breath. 150 mL 1  . albuterol (VENTOLIN HFA) 108 (90 Base) MCG/ACT inhaler Inhale 2 puffs into the lungs every 4 (four) hours as needed for wheezing or shortness of breath. 54 g 3  . allopurinol (ZYLOPRIM)  100 MG tablet Take 1 tablet (100 mg total) by mouth daily. 30 tablet 6  . amLODipine (NORVASC) 10 MG tablet Take 1 tablet (10 mg total) by mouth daily. 90 tablet 1  . aspirin EC 81 MG EC tablet Take 1 tablet (81 mg total) by mouth daily.    [name] atorvastatin (LIPITOR) 80 MG tablet Take 1 tablet (80 mg total) by mouth daily at 6 PM. 90 tablet 3  . Blood Glucose Monitoring Suppl (ACCU-CHEK AVIVA) device Use as instructed daily. 1 each 0  . carvedilol (COREG) 25 MG tablet TAKE 1 TABLET BY MOUTH 2 TIMES DAILY WITH A MEAL. 180 tablet 3  . colchicine 0.6 MG tablet Take 0.5 tablets (0.3 mg total) by mouth daily.    [name] docusate sodium (COLACE) 100 MG capsule Take 1 capsule (100 mg total) every 12 (twelve) hours by mouth. 30 capsule 0  . exenatide (BYETTA 10 MCG PEN) 10 MCG/0.04ML SOPN injection Inject 0.04 mLs (10 mcg total) into the skin 2 (two) times daily with a meal. 30 mL 1  . ferrous sulfate 325 (65 FE) MG tablet Take 1 tablet (325 mg total) by mouth 2 (two) times daily with a meal. 180 tablet 1  . fluticasone (FLONASE) 50 MCG/ACT nasal spray Place 2 sprays into both nostrils daily as needed for allergies or rhinitis. 16 g 5  . Fluticasone-Salmeterol (ADVAIR DISKUS) 250-50 MCG/DOSE AEPB Inhale 1 puff into the lungs 2 (two) times daily. (Patient taking differently: Inhale 1 puff into the lungs 2 (two) times daily as needed (shortness of breath). ) 60 each 3  . furosemide (LASIX) 40 MG tablet Take 1 tablet (40 mg total) by mouth daily. 90 tablet 3  . gabapentin (NEURONTIN) 100 MG capsule TAKE 2 CAPSULES BY MOUTH 3 TIMES A DAY. 180 capsule 1  . glucose blood (ACCU-CHEK AVIVA) test strip Use as instructed daily 100 each 12  . glucose blood (TRUE METRIX BLOOD GLUCOSE TEST) test strip Use as instructed 100 each 12  . hydrALAZINE (APRESOLINE) 50 MG tablet Take 1 tablet (50 mg total) by mouth 3 (three) times daily. 270 tablet 3  . HYDROcodone-acetaminophen (NORCO/VICODIN) 5-325 MG tablet Take 1 tablet every 4  (four) hours as needed by mouth for moderate pain. 15 tablet 0  . hydrOXYzine (ATARAX/VISTARIL) 50 MG tablet TAKE 1 TABLET (50 MG TOTAL) BY MOUTH 3 (THREE) TIMES DAILY AS NEEDED. (Patient taking differently: Take 50 mg by mouth 3 (three) times daily as needed for itching. ) 90 tablet 0  . insulin aspart (NOVOLOG) 100 UNIT/ML injection 0-12 units subcutaneously 3 times daily before meals according to sliding scale 3 vial 5  . Insulin Glargine (LANTUS SOLOSTAR) 100 UNIT/ML Solostar Pen Inject 40 Units into the skin 2 (two) times daily. (Patient taking differently: Inject 30 Units into the skin 2 (two) times daily. )  24 mL 2  . isosorbide mononitrate (IMDUR) 60 MG 24 hr tablet Take 1 tablet (60 mg total) by mouth daily. 30 tablet 6  . ketotifen (ZADITOR) 0.025 % ophthalmic solution Place 1 drop into both eyes daily as needed (for dry eyes). 10 mL 2  . Lancet Devices (ACCU-CHEK SOFTCLIX) lancets Use as instructed daily. 1 each 5  . nitroGLYCERIN (NITROSTAT) 0.4 MG SL tablet Place 1 tablet (0.4 mg total) under the tongue every 5 (five) minutes x 3 doses as needed for chest pain. 25 tablet 12  . omeprazole (PRILOSEC) 20 MG capsule Take 1 capsule (20 mg total) by mouth daily. 90 capsule 1  . oxyCODONE-acetaminophen (PERCOCET/ROXICET) 5-325 MG tablet Take 1 tablet by mouth every 6 (six) hours as needed for severe pain. 15 tablet 0  . pregabalin (LYRICA) 100 MG capsule Take 1 capsule (100 mg total) by mouth 3 (three) times daily. 90 capsule 1  . ticagrelor (BRILINTA) 90 MG TABS tablet Take 1 tablet (90 mg total) by mouth 2 (two) times daily. 180 tablet 2  . valACYclovir (VALTREX) 500 MG tablet Take 1 tablet (500 mg total) by mouth daily. 90 tablet 3  . doxycycline (VIBRAMYCIN) 100 MG capsule Take 1 capsule (100 mg total) 2 (two) times daily by mouth. (Patient not taking: Reported on 05/28/2017) 14 capsule 0   No facility-administered medications prior to visit.     ROS Review of Systems  Constitutional:  Negative for activity change, appetite change and fatigue.  HENT: Negative for congestion, sinus pressure and sore throat.   Eyes: Negative for visual disturbance.  Respiratory: Positive for shortness of breath. Negative for cough, chest tightness and wheezing.   Cardiovascular: Negative for chest pain and palpitations.  Gastrointestinal: Negative for abdominal distention, abdominal pain and constipation.  Endocrine: Negative for polydipsia.  Genitourinary: Negative for dysuria and frequency.  Musculoskeletal: Negative for arthralgias and back pain.  Skin: Positive for wound. Negative for rash.  Neurological: Negative for tremors, light-headedness and numbness.  Hematological: Does not bruise/bleed easily.  Psychiatric/Behavioral: Negative for agitation and behavioral problems.    Objective:  BP (!) 160/84   Pulse 89   Temp 99.4 F (37.4 C) (Oral)   Ht _0  (1.727 m)   SpO2 92%   BMI 44.40 kg/m   BP/Weight 05/28/2017 05/25/2017 78/29/5621  Systolic BP 308 657 -  Diastolic BP 84 69 -  Wt. (Lbs) - - 292  BMI 44.4 - 44.4      Physical Exam  Constitutional: She is oriented to person, place, and time. She appears well-developed and well-nourished.  Cardiovascular: Normal rate, normal heart sounds and intact distal pulses.  No murmur heard. Pulmonary/Chest: Effort normal and breath sounds normal. She has no wheezes. She has no rales. She exhibits no tenderness.  Abdominal: Soft. Bowel sounds are normal. She exhibits no distension and no mass. There is no tenderness.  Musculoskeletal: She exhibits edema (induration of medial aspect of thigh, no purulent drainage) and tenderness.  Neurological: She is alert and oriented to person, place, and time.     Lab Results  Component Value Date   HGBA1C 9.2 05/28/2017    Assessment & Plan:   1. Diabetes mellitus type 2, uncontrolled, with complications (HCC) Uncontrolled with A1c of 9.2 CBG of 552 due to not taking medications -  30 units of Novolog administered and CBG repeated after 30 minutes Would love to administer IVF but she declines Emphasized compliance with medications and diet - POCT glucose (manual entry) -  POCT glycosylated hemoglobin (Hb A1C) - insulin aspart (novoLOG) injection 30 Units  2. Hematoma of left thigh, initial encounter With superimposed infection Continue Doxycycline and Vicodin Will give Tylenol 3 at her next visit if she runs out Dressing change performed in the clinic - ketorolac (TORADOL) injection 60 mg  3. Essential hypertension Uncontrolled as she is yet to take her antihypertensives Low sodium, DASH diet  4. Chronic combined systolic and diastolic CHF (congestive heart failure) (HCC) EF 60-65% , no wall motion abnormalities, grade 1DD from 2d echo of 02/2017 Cardiac cath - 04/29/17 : widely patent LAD stent, no significant change in LCx, OM and diagonal disease Ongoing dyspnea thought to be secondary to obesity as she was recently evaluated by cardiology   Meds ordered this encounter  Medications  . insulin aspart (novoLOG) injection 30 Units  . ketorolac (TORADOL) injection 60 mg    Follow-up: Return in about 5 days (around 06/02/2017) for 8:30am follow up of hematoma.   Arnoldo Morale MD

## 2017-05-31 ENCOUNTER — Telehealth: Payer: Self-pay

## 2017-05-31 ENCOUNTER — Encounter: Payer: Self-pay | Admitting: Family Medicine

## 2017-05-31 NOTE — Telephone Encounter (Signed)
Dr Jarold Song requesting to see the patient in the office on 06/02/17. Dorise Hiss, [facility] scheduler placed call to patient to attempt to schedule appointment. Voicemail message left requesting a call back to [facility]

## 2017-06-01 ENCOUNTER — Telehealth: Payer: Self-pay

## 2017-06-01 ENCOUNTER — Inpatient Hospital Stay (HOSPITAL_COMMUNITY)
Admission: EM | Admit: 2017-06-01 | Discharge: 2017-06-06 | DRG: 602 | Disposition: A | Discharge status: Home Health | Payer: Medicaid Other | Attending: Internal Medicine | Admitting: Internal Medicine

## 2017-06-01 ENCOUNTER — Encounter (HOSPITAL_COMMUNITY): Payer: Self-pay | Admitting: Emergency Medicine

## 2017-06-01 ENCOUNTER — Emergency Department (HOSPITAL_COMMUNITY): Payer: Medicaid Other

## 2017-06-01 DIAGNOSIS — I352 Nonrheumatic aortic (valve) stenosis with insufficiency: Secondary | ICD-10-CM | POA: Diagnosis present

## 2017-06-01 DIAGNOSIS — G92 Toxic encephalopathy: Secondary | ICD-10-CM | POA: Diagnosis not present

## 2017-06-01 DIAGNOSIS — E118 Type 2 diabetes mellitus with unspecified complications: Secondary | ICD-10-CM

## 2017-06-01 DIAGNOSIS — E1140 Type 2 diabetes mellitus with diabetic neuropathy, unspecified: Secondary | ICD-10-CM | POA: Diagnosis present

## 2017-06-01 DIAGNOSIS — E861 Hypovolemia: Secondary | ICD-10-CM | POA: Diagnosis present

## 2017-06-01 DIAGNOSIS — Z885 Allergy status to narcotic agent status: Secondary | ICD-10-CM

## 2017-06-01 DIAGNOSIS — G9341 Metabolic encephalopathy: Secondary | ICD-10-CM

## 2017-06-01 DIAGNOSIS — G4733 Obstructive sleep apnea (adult) (pediatric): Secondary | ICD-10-CM | POA: Diagnosis present

## 2017-06-01 DIAGNOSIS — M1A39X0 Chronic gout due to renal impairment, multiple sites, without tophus (tophi): Secondary | ICD-10-CM | POA: Diagnosis present

## 2017-06-01 DIAGNOSIS — L0291 Cutaneous abscess, unspecified: Secondary | ICD-10-CM

## 2017-06-01 DIAGNOSIS — E1122 Type 2 diabetes mellitus with diabetic chronic kidney disease: Secondary | ICD-10-CM | POA: Diagnosis present

## 2017-06-01 DIAGNOSIS — Z531 Procedure and treatment not carried out because of patient's decision for reasons of belief and group pressure: Secondary | ICD-10-CM | POA: Diagnosis present

## 2017-06-01 DIAGNOSIS — K219 Gastro-esophageal reflux disease without esophagitis: Secondary | ICD-10-CM | POA: Diagnosis present

## 2017-06-01 DIAGNOSIS — Z794 Long term (current) use of insulin: Secondary | ICD-10-CM

## 2017-06-01 DIAGNOSIS — E785 Hyperlipidemia, unspecified: Secondary | ICD-10-CM | POA: Diagnosis present

## 2017-06-01 DIAGNOSIS — B1920 Unspecified viral hepatitis C without hepatic coma: Secondary | ICD-10-CM | POA: Diagnosis present

## 2017-06-01 DIAGNOSIS — E1165 Type 2 diabetes mellitus with hyperglycemia: Secondary | ICD-10-CM | POA: Diagnosis present

## 2017-06-01 DIAGNOSIS — D631 Anemia in chronic kidney disease: Secondary | ICD-10-CM | POA: Diagnosis present

## 2017-06-01 DIAGNOSIS — Z90710 Acquired absence of both cervix and uterus: Secondary | ICD-10-CM

## 2017-06-01 DIAGNOSIS — I350 Nonrheumatic aortic (valve) stenosis: Secondary | ICD-10-CM

## 2017-06-01 DIAGNOSIS — E1100 Type 2 diabetes mellitus with hyperosmolarity without nonketotic hyperglycemic-hyperosmolar coma (NKHHC): Secondary | ICD-10-CM | POA: Diagnosis present

## 2017-06-01 DIAGNOSIS — I130 Hypertensive heart and chronic kidney disease with heart failure and stage 1 through stage 4 chronic kidney disease, or unspecified chronic kidney disease: Secondary | ICD-10-CM | POA: Diagnosis present

## 2017-06-01 DIAGNOSIS — L7622 Postprocedural hemorrhage and hematoma of skin and subcutaneous tissue following other procedure: Secondary | ICD-10-CM | POA: Diagnosis not present

## 2017-06-01 DIAGNOSIS — M1030 Gout due to renal impairment, unspecified site: Secondary | ICD-10-CM | POA: Diagnosis present

## 2017-06-01 DIAGNOSIS — E669 Obesity, unspecified: Secondary | ICD-10-CM | POA: Diagnosis present

## 2017-06-01 DIAGNOSIS — T45525A Adverse effect of antithrombotic drugs, initial encounter: Secondary | ICD-10-CM | POA: Diagnosis not present

## 2017-06-01 DIAGNOSIS — Z87891 Personal history of nicotine dependence: Secondary | ICD-10-CM

## 2017-06-01 DIAGNOSIS — I5042 Chronic combined systolic (congestive) and diastolic (congestive) heart failure: Secondary | ICD-10-CM | POA: Diagnosis present

## 2017-06-01 DIAGNOSIS — I252 Old myocardial infarction: Secondary | ICD-10-CM

## 2017-06-01 DIAGNOSIS — Z713 Dietary counseling and surveillance: Secondary | ICD-10-CM

## 2017-06-01 DIAGNOSIS — Z7982 Long term (current) use of aspirin: Secondary | ICD-10-CM

## 2017-06-01 DIAGNOSIS — Z7902 Long term (current) use of antithrombotics/antiplatelets: Secondary | ICD-10-CM

## 2017-06-01 DIAGNOSIS — D62 Acute posthemorrhagic anemia: Secondary | ICD-10-CM | POA: Diagnosis not present

## 2017-06-01 DIAGNOSIS — E871 Hypo-osmolality and hyponatremia: Secondary | ICD-10-CM | POA: Diagnosis present

## 2017-06-01 DIAGNOSIS — Z955 Presence of coronary angioplasty implant and graft: Secondary | ICD-10-CM

## 2017-06-01 DIAGNOSIS — Z833 Family history of diabetes mellitus: Secondary | ICD-10-CM

## 2017-06-01 DIAGNOSIS — N184 Chronic kidney disease, stage 4 (severe): Secondary | ICD-10-CM | POA: Diagnosis present

## 2017-06-01 DIAGNOSIS — Z8249 Family history of ischemic heart disease and other diseases of the circulatory system: Secondary | ICD-10-CM

## 2017-06-01 DIAGNOSIS — T40695A Adverse effect of other narcotics, initial encounter: Secondary | ICD-10-CM | POA: Diagnosis not present

## 2017-06-01 DIAGNOSIS — F329 Major depressive disorder, single episode, unspecified: Secondary | ICD-10-CM | POA: Diagnosis present

## 2017-06-01 DIAGNOSIS — Z888 Allergy status to other drugs, medicaments and biological substances status: Secondary | ICD-10-CM

## 2017-06-01 DIAGNOSIS — I2510 Atherosclerotic heart disease of native coronary artery without angina pectoris: Secondary | ICD-10-CM | POA: Diagnosis present

## 2017-06-01 DIAGNOSIS — L02416 Cutaneous abscess of left lower limb: Secondary | ICD-10-CM | POA: Diagnosis present

## 2017-06-01 DIAGNOSIS — I10 Essential (primary) hypertension: Secondary | ICD-10-CM | POA: Diagnosis present

## 2017-06-01 DIAGNOSIS — L03116 Cellulitis of left lower limb: Secondary | ICD-10-CM | POA: Diagnosis present

## 2017-06-01 DIAGNOSIS — Z91013 Allergy to seafood: Secondary | ICD-10-CM

## 2017-06-01 DIAGNOSIS — Z7951 Long term (current) use of inhaled steroids: Secondary | ICD-10-CM

## 2017-06-01 DIAGNOSIS — Z79899 Other long term (current) drug therapy: Secondary | ICD-10-CM

## 2017-06-01 DIAGNOSIS — R739 Hyperglycemia, unspecified: Secondary | ICD-10-CM

## 2017-06-01 DIAGNOSIS — Z79891 Long term (current) use of opiate analgesic: Secondary | ICD-10-CM

## 2017-06-01 LAB — CBC WITH DIFFERENTIAL/PLATELET
Basophils Absolute: 0 10*3/uL (ref 0.0–0.1)
Basophils Relative: 0 %
Eosinophils Absolute: 0.2 10*3/uL (ref 0.0–0.7)
Eosinophils Relative: 2 %
HCT: 27.7 % — ABNORMAL LOW (ref 36.0–46.0)
Hemoglobin: 9.3 g/dL — ABNORMAL LOW (ref 12.0–15.0)
Lymphocytes Relative: 14 %
Lymphs Abs: 2.2 10*3/uL (ref 0.7–4.0)
MCH: 29.6 pg (ref 26.0–34.0)
MCHC: 33.6 g/dL (ref 30.0–36.0)
MCV: 88.2 fL (ref 78.0–100.0)
Monocytes Absolute: 1.1 10*3/uL — ABNORMAL HIGH (ref 0.1–1.0)
Monocytes Relative: 7 %
Neutro Abs: 11.6 10*3/uL — ABNORMAL HIGH (ref 1.7–7.7)
Neutrophils Relative %: 77 %
Platelets: 304 10*3/uL (ref 150–400)
RBC: 3.14 MIL/uL — ABNORMAL LOW (ref 3.87–5.11)
RDW: 14.8 % (ref 11.5–15.5)
WBC: 15.1 10*3/uL — ABNORMAL HIGH (ref 4.0–10.5)

## 2017-06-01 LAB — BASIC METABOLIC PANEL
Anion gap: 12 (ref 5–15)
BUN: 82 mg/dL — ABNORMAL HIGH (ref 6–20)
CO2: 22 mmol/L (ref 22–32)
Calcium: 8.3 mg/dL — ABNORMAL LOW (ref 8.9–10.3)
Chloride: 95 mmol/L — ABNORMAL LOW (ref 101–111)
Creatinine, Ser: 2.56 mg/dL — ABNORMAL HIGH (ref 0.44–1.00)
GFR calc Af Amer: 22 mL/min — ABNORMAL LOW (ref >60)
GFR calc non Af Amer: 19 mL/min — ABNORMAL LOW (ref >60)
Glucose, Bld: 423 mg/dL — ABNORMAL HIGH (ref 65–99)
Potassium: 5.1 mmol/L (ref 3.5–5.1)
Sodium: 129 mmol/L — ABNORMAL LOW (ref 135–145)

## 2017-06-01 LAB — I-STAT CG4 LACTIC ACID, ED: Lactic Acid, Venous: 1.11 mmol/L (ref 0.5–1.9)

## 2017-06-01 LAB — CBG MONITORING, ED
Glucose-Capillary: 346 mg/dL — ABNORMAL HIGH (ref 65–99)
Glucose-Capillary: 394 mg/dL — ABNORMAL HIGH (ref 65–99)

## 2017-06-01 MED ORDER — LIDOCAINE-EPINEPHRINE (PF) 2 %-1:200000 IJ SOLN
INTRAMUSCULAR | Status: AC
  Administered 2017-06-02: 20 mL
  Filled 2017-06-01: qty 20

## 2017-06-01 MED ORDER — FENTANYL CITRATE (PF) 100 MCG/2ML IJ SOLN
50.0000 ug | Freq: Once | INTRAMUSCULAR | Status: AC
  Administered 2017-06-01: 50 ug via INTRAVENOUS
  Filled 2017-06-01: qty 2

## 2017-06-01 MED ORDER — SODIUM CHLORIDE 0.9 % IV SOLN
INTRAVENOUS | Status: DC
  Administered 2017-06-02: via INTRAVENOUS
  Filled 2017-06-01: qty 1

## 2017-06-01 MED ORDER — LIDOCAINE-EPINEPHRINE (PF) 2 %-1:200000 IJ SOLN
20.0000 mL | Freq: Once | INTRAMUSCULAR | Status: AC
  Administered 2017-06-02: 20 mL
  Filled 2017-06-01: qty 20

## 2017-06-01 MED ORDER — DEXTROSE-NACL 5-0.45 % IV SOLN
INTRAVENOUS | Status: DC
  Administered 2017-06-02: 04:00:00 via INTRAVENOUS

## 2017-06-01 MED ORDER — SODIUM CHLORIDE 0.9 % IV BOLUS (SEPSIS)
1000.0000 mL | Freq: Once | INTRAVENOUS | Status: AC
  Administered 2017-06-01: 1000 mL via INTRAVENOUS

## 2017-06-01 NOTE — ED Triage Notes (Signed)
Per [facility] patient from home for swelling and pain to abscess area on left thigh that she was seen for on 05/24/17.  Patient was ambulatory at scene.

## 2017-06-01 NOTE — Telephone Encounter (Signed)
Call placed to the patient and confirmed an appointment with Dr Jarold Song for tomorrow - 06/02/17 @ 1415. [name] reported that her left thigh is very painful and [name] can't walk. [name] noted that the [name] has been doing the dressing changes to the wound as ordered and stated that the area is red and hot.  [name] is not on antibiotic. [name] said that [name] feels " a little warm" but did not think [name] has a fever.  Informed her that Dr Jarold Song would be notified of her status.

## 2017-06-01 NOTE — Telephone Encounter (Signed)
Will see at her OV

## 2017-06-01 NOTE — ED Provider Notes (Signed)
[facility] DEPT Provider Note   CSN: [id number] Arrival date & time: 06/01/17  1851     History   Chief Complaint Chief Complaint  Patient presents with  . Recurrent Skin Infections    HPI [name] is a 61 y.o. female presenting with pain and swelling of left medial thigh.  Patient states she started to have pain and swelling of this area 9 days ago.  She was seen 8 days ago, and I&D was performed.  Purulent material was expressed.  Patient was started on antibiotics, which she states she took until they were completed.  She states that since the I&D, she has had continued pain, and worsening redness and swelling of the area.  She was given Norco which did not improve her pain.  She denies anything draining from the area.  She denies history of similar.  She denies fevers, chills, nausea, or vomiting.  She is on blood thinners.  History of diabetes, does not check her sugars daily.  At the office visit 4 days ago, her blood sugar was 500.  She states she has not been eating much, just some chicken soup. Movement and palpation makes the pain worse, nothing makes it better.  HPI  Past Medical History:  Diagnosis Date  . Aortic stenosis    Echo 8/18: mean 13, peak 28, LVOT/AV mean velocity 0.51  . Asthma    As a child   . Bronchitis   . CAD (coronary artery disease)    a. 09/2016: 50% Ost 1st Mrg stenosis, 50% 2nd Mrg stenosis, 20% Mid-Cx, 95% Prox LAD, 40% mid-LAD, and 10% dist-LAD stenosis. Staged PCI with DES to Prox-LAD.   [name] Chronic combined systolic and diastolic CHF (congestive heart failure) ([place]) 2011   echo 2/18: EF 55-60, normal wall motion, grade 2 diastolic dysfunction, trivial AI // echo 3/18: Septal and apical HK, EF 45-50, normal wall motion, trivial AI, mild LAE, PASP 38 // echo 8/18: EF 60-65, normal wall motion, grade 1 diastolic dysfunction, calcified aortic valve leaflets, mild aortic stenosis (mean 13, peak 28, LVOT/AV mean  velocity 0.51), mild AI, moderate MAC, mild LAE, trivial TR   . Complication of anesthesia   . Diabetes mellitus Dx 1989  . Hepatitis C Dx 2013  . Hypertension Dx 1989  . Obesity   . Pancreatitis 2013  . Refusal of blood transfusions as patient is Jehovah's Witness   . Tendinitis   . Ulcer 2010    Patient Active Problem List   Diagnosis Date Noted  . Abscess 06/02/2017  . Chest pain 04/29/2017  . Chest pain with moderate risk of acute coronary syndrome 04/28/2017  . Acute on chronic combined systolic and diastolic CHF, NYHA class 4 ([place]) 03/19/2017  . Unstable angina ([place]) 03/17/2017  . Aortic stenosis   . Herpes simplex infection of genitourinary system 02/12/2017  . Chronic pain of right knee 02/12/2017  . Chronic gout due to renal impairment of multiple sites without tophus 02/12/2017  . Esophageal dysphagia 02/12/2017  . Osteoarthritis 01/22/2017  . Diabetic hyperosmolar non-ketotic state ([place]) 12/13/2016  . CAD (coronary artery disease) 12/13/2016  . Hyperlipidemia 12/13/2016  . Hyperphosphatemia 12/13/2016  . History of non-ST elevation myocardial infarction (NSTEMI) 09/13/2016  . Acute myopericarditis   . Injury of left hand 02/24/2016  . Asthma 11/29/2015  . Trigger middle finger 11/25/2015  . Depression 09/05/2015  . GERD (gastroesophageal reflux disease) 03/25/2015  . Precordial pain   . Environmental allergies 03/14/2015  .  Hep C w/o coma, chronic ([place]) 01/31/2015  . Diabetic neuropathy ([place]) 01/31/2015  . OSA (obstructive sleep apnea) 01/01/2015  . Poor dentition 11/13/2014  . Essential hypertension 10/08/2014  . Obesity 10/08/2014  . CKD (chronic kidney disease) stage 4, GFR 15-29 ml/min ([facility]) 10/08/2014  . Diabetes mellitus type 2, uncontrolled, with complications ([place]) 00/92/3300    Class: Chronic  . Anemia in stage 3 chronic kidney disease ([place]) 03/25/2012  . Chronic combined systolic and diastolic CHF (congestive heart failure) ([place]) 07/13/2009     Past Surgical History:  Procedure Laterality Date  . CHOLECYSTECTOMY    . CORONARY STENT INTERVENTION N/A 09/18/2016   Procedure: Coronary Stent Intervention;  Surgeon: Troy Sine, MD;  Location: [facility];  Service: Cardiovascular;  Laterality: N/A;  . EYE SURGERY    . KNEE ARTHROSCOPY    . LEFT HEART CATH N/A 09/18/2016   Procedure: Left Heart Cath;  Surgeon: Troy Sine, MD;  Location: [facility];  Service: Cardiovascular;  Laterality: N/A;  . LEFT HEART CATH AND CORONARY ANGIOGRAPHY N/A 09/16/2016   Procedure: Left Heart Cath and Coronary Angiography;  Surgeon: Burnell Blanks, MD;  Location: [facility];  Service: Cardiovascular;  Laterality: N/A;  . LEFT HEART CATH AND CORONARY ANGIOGRAPHY N/A 04/29/2017   Procedure: LEFT HEART CATH AND CORONARY ANGIOGRAPHY;  Surgeon: Nelva Bush, MD;  Location: [facility];  Service: Cardiovascular;  Laterality: N/A;  . TUBAL LIGATION    . TUBAL LIGATION      OB History    No data available       Home Medications    Prior to Admission medications   Medication Sig Start Date End Date Taking? Authorizing Provider  acetaminophen-codeine (TYLENOL #3) 300-30 MG tablet Take 1 tablet by mouth every 12 (twelve) hours as needed for severe pain. For osteoarthritis (unable to take NSAIDs due to CKD) 04/14/17  Yes Amao, Charlane Ferretti, MD  albuterol (PROVENTIL) (2.5 MG/3ML) 0.083% nebulizer solution Take 3 mLs (2.5 mg total) by nebulization every 6 (six) hours as needed for wheezing or shortness of breath. 12/17/16  Yes Argentina Donovan, PA-C  albuterol (VENTOLIN HFA) 108 (90 Base) MCG/ACT inhaler Inhale 2 puffs into the lungs every 4 (four) hours as needed for wheezing or shortness of breath. 08/03/16  Yes Funches, Josalyn, MD  allopurinol (ZYLOPRIM) 100 MG tablet Take 1 tablet (100 mg total) by mouth daily. 02/12/17  Yes Funches, Josalyn, MD  amLODipine (NORVASC) 10 MG tablet Take 1 tablet (10 mg total) by mouth  daily. 04/14/17  Yes Arnoldo Morale, MD  aspirin EC 81 MG EC tablet Take 1 tablet (81 mg total) by mouth daily. 09/19/16  Yes Strader, [place] M, PA-C  atorvastatin (LIPITOR) 80 MG tablet Take 1 tablet (80 mg total) by mouth daily at 6 PM. 02/12/17  Yes Funches, Josalyn, MD  carvedilol (COREG) 25 MG tablet TAKE 1 TABLET BY MOUTH 2 TIMES DAILY WITH A MEAL. 02/12/17  Yes Funches, Josalyn, MD  colchicine 0.6 MG tablet Take 0.5 tablets (0.3 mg total) by mouth daily. Patient taking differently: Take 0.6 mg by mouth daily.  05/03/17  Yes Velna Hatchet, MD  exenatide (BYETTA 10 MCG PEN) 10 MCG/0.04ML SOPN injection Inject 0.04 mLs (10 mcg total) into the skin 2 (two) times daily with a meal. 04/30/17  Yes Amao, Enobong, MD  ferrous sulfate 325 (65 FE) MG tablet Take 1 tablet (325 mg total) by mouth 2 (two) times daily with a  meal. 04/14/17  Yes Amao, Charlane Ferretti, MD  fluticasone (FLONASE) 50 MCG/ACT nasal spray Place 2 sprays into both nostrils daily as needed for allergies or rhinitis. 01/07/17  Yes Funches, Josalyn, MD  Fluticasone-Salmeterol (ADVAIR DISKUS) 250-50 MCG/DOSE AEPB Inhale 1 puff into the lungs 2 (two) times daily. Patient taking differently: Inhale 1 puff into the lungs 2 (two) times daily as needed (shortness of breath).  12/17/16  Yes Argentina Donovan, PA-C  furosemide (LASIX) 40 MG tablet Take 1 tablet (40 mg total) by mouth daily. 05/12/17 05/12/18 Yes Simmons, Brittainy M, PA-C  gabapentin (NEURONTIN) 100 MG capsule TAKE 2 CAPSULES BY MOUTH 3 TIMES A DAY. Patient taking differently: Take 200 mg by mouth 3 (three) times daily.  05/10/17  Yes Arnoldo Morale, MD  hydrALAZINE (APRESOLINE) 50 MG tablet Take 1 tablet (50 mg total) by mouth 3 (three) times daily. 02/12/17  Yes Funches, Josalyn, MD  HYDROcodone-acetaminophen (NORCO/VICODIN) 5-325 MG tablet Take 1 tablet every 4 (four) hours as needed by mouth for moderate pain. 05/24/17  Yes Jola Schmidt, MD  hydrOXYzine (ATARAX/VISTARIL) 50 MG tablet TAKE  1 TABLET (50 MG TOTAL) BY MOUTH 3 (THREE) TIMES DAILY AS NEEDED. Patient taking differently: Take 50 mg by mouth 3 (three) times daily as needed for itching.  03/02/17  Yes Jegede, Olugbemiga E, MD  insulin aspart (NOVOLOG) 100 UNIT/ML injection 0-12 units subcutaneously 3 times daily before meals according to sliding scale 04/14/17  Yes Amao, Charlane Ferretti, MD  Insulin Glargine (LANTUS SOLOSTAR) 100 UNIT/ML Solostar Pen Inject 40 Units into the skin 2 (two) times daily. Patient taking differently: Inject 30 Units into the skin 2 (two) times daily.  04/14/17  Yes Arnoldo Morale, MD  isosorbide mononitrate (IMDUR) 60 MG 24 hr tablet Take 1 tablet (60 mg total) by mouth daily. 05/05/17  Yes Lyda Jester M, PA-C  ketotifen (ZADITOR) 0.025 % ophthalmic solution Place 1 drop into both eyes daily as needed (for dry eyes). 07/27/16  Yes Funches, Josalyn, MD  nitroGLYCERIN (NITROSTAT) 0.4 MG SL tablet Place 1 tablet (0.4 mg total) under the tongue every 5 (five) minutes x 3 doses as needed for chest pain. 03/19/17  Yes Barrett, Evelene Croon, PA-C  omeprazole (PRILOSEC) 20 MG capsule Take 1 capsule (20 mg total) by mouth daily. 04/14/17  Yes Arnoldo Morale, MD  pregabalin (LYRICA) 100 MG capsule Take 1 capsule (100 mg total) by mouth 3 (three) times daily. 05/03/17  Yes Arnoldo Morale, MD  ticagrelor (BRILINTA) 90 MG TABS tablet Take 1 tablet (90 mg total) by mouth 2 (two) times daily. 05/05/17  Yes Lyda Jester M, PA-C  valACYclovir (VALTREX) 500 MG tablet Take 1 tablet (500 mg total) by mouth daily. 04/14/17  Yes Arnoldo Morale, MD  Blood Glucose Monitoring Suppl (ACCU-CHEK AVIVA) device Use as instructed daily. 04/14/17   Arnoldo Morale, MD  docusate sodium (COLACE) 100 MG capsule Take 1 capsule (100 mg total) every 12 (twelve) hours by mouth. Patient not taking: Reported on 06/01/2017 05/25/17   Jola Schmidt, MD  doxycycline (VIBRAMYCIN) 100 MG capsule Take 1 capsule (100 mg total) 2 (two) times daily by  mouth. Patient not taking: Reported on 05/28/2017 05/24/17   Jola Schmidt, MD  glucose blood (ACCU-CHEK AVIVA) test strip Use as instructed daily 04/14/17   Arnoldo Morale, MD  glucose blood (TRUE METRIX BLOOD GLUCOSE TEST) test strip Use as instructed 03/19/17   Barrett, Evelene Croon, PA-C  Lancet Devices [facility]) lancets Use as instructed daily. 04/14/17   Arnoldo Morale,  MD  oxyCODONE-acetaminophen (PERCOCET/ROXICET) 5-325 MG tablet Take 1 tablet by mouth every 6 (six) hours as needed for severe pain. Patient not taking: Reported on 06/01/2017 04/11/17   Dalia Heading, PA-C    Family History Family History  Problem Relation Age of Onset  . Colon cancer Mother   . Heart attack Other   . Heart attack Maternal Grandmother   . Hypertension Sister   . Hypertension Brother   . Diabetes Paternal Grandmother   . Breast cancer Neg Hx     Social History Social History   Tobacco Use  . Smoking status: Former Smoker    Last attempt to quit: 10/25/1980    Years since quitting: 36.6  . Smokeless tobacco: Never Used  . Tobacco comment: quit smoking in 1982  Substance Use Topics  . Alcohol use: Yes    Comment: 3 times in last year  . Drug use: No    Comment: 08/21/2016 "clean since 05/1998"     Allergies   Shellfish allergy; Diazepam; and Morphine and related   Review of Systems Review of Systems  Constitutional: Negative for chills and fever.  HENT: Negative for congestion and sore throat.   Respiratory: Negative for cough, chest tightness and shortness of breath.   Cardiovascular: Negative for chest pain.  Gastrointestinal: Negative for abdominal pain, nausea and vomiting.  Genitourinary: Negative for dysuria, frequency and hematuria.  Musculoskeletal: Negative for back pain and neck pain.  Skin: Positive for color change and wound.       Painful lesion of left medial thigh  Neurological: Negative for headaches.  Hematological: Bruises/bleeds easily.   Psychiatric/Behavioral: Negative for confusion.     Physical Exam Updated Vital Signs BP (!) 153/75 (BP Location: Left Arm)   Pulse 82   Temp 99.1 F (37.3 C) (Oral)   Resp 14   Ht _0  (1.727 m)   Wt 132.5 kg (292 lb)   SpO2 95%   BMI 44.40 kg/m   Physical Exam  Constitutional: She is oriented to person, place, and time. She appears well-developed and well-nourished. No distress.  HENT:  Head: Normocephalic and atraumatic.  Eyes: EOM are normal.  Neck: Normal range of motion.  Cardiovascular: Normal rate, regular rhythm and intact distal pulses.  Pulmonary/Chest: Effort normal and breath sounds normal. No respiratory distress. She has no wheezes.  Abdominal: Soft. She exhibits no distension and no mass. There is no tenderness. There is no guarding.  Musculoskeletal: Normal range of motion.  Neurological: She is alert and oriented to person, place, and time.  Skin: Skin is warm and dry.  6 cm lesion of left medial thigh.  Lesion is indurated with multiple superficial blisters.  Surrounding skin changes and erythema indicative of cellulitis.  No active drainage.  Psychiatric: She has a normal mood and affect.  Nursing note and vitals reviewed.    ED Treatments / Results  Labs (all labs ordered are listed, but only abnormal results are displayed) Labs Reviewed  CBC WITH DIFFERENTIAL/PLATELET - Abnormal; Notable for the following components:      Result Value   WBC 15.1 (*)    RBC 3.14 (*)    Hemoglobin 9.3 (*)    HCT 27.7 (*)    Neutro Abs 11.6 (*)    Monocytes Absolute 1.1 (*)    All other components within normal limits  BASIC METABOLIC PANEL - Abnormal; Notable for the following components:   Sodium 129 (*)    Chloride 95 (*)    Glucose,  Bld 423 (*)    BUN 82 (*)    Creatinine, Ser 2.56 (*)    Calcium 8.3 (*)    GFR calc non Af Amer 19 (*)    GFR calc Af Amer 22 (*)    All other components within normal limits  CBG MONITORING, ED - Abnormal; Notable for  the following components:   Glucose-Capillary 394 (*)    All other components within normal limits  I-STAT CG4 LACTIC ACID, ED - Abnormal; Notable for the following components:   Lactic Acid, Venous 0.47 (*)    All other components within normal limits  CBG MONITORING, ED - Abnormal; Notable for the following components:   Glucose-Capillary 346 (*)    All other components within normal limits  CBG MONITORING, ED - Abnormal; Notable for the following components:   Glucose-Capillary 323 (*)    All other components within normal limits  AEROBIC CULTURE (SUPERFICIAL SPECIMEN)  URINALYSIS, ROUTINE W REFLEX MICROSCOPIC  I-STAT CG4 LACTIC ACID, ED    EKG  EKG Interpretation None       Radiology Ct Femur Left Wo Contrast  Result Date: 06/01/2017 CLINICAL DATA:  Left thigh swelling and pain. EXAM: CT OF THE LOWER LEFT EXTREMITY WITHOUT CONTRAST TECHNIQUE: Multidetector CT imaging of the lower left extremity was performed according to the standard protocol. COMPARISON:  None. FINDINGS: Bones/Joint/Cartilage Negative for acute fracture or bone destruction. Osteoarthritis of the left hip and knee with joint space narrowing. Spurring of the femoral condyles consistent with osteoarthritis. No suspicious osseous lesions. Ligaments Suboptimally assessed by CT. Muscles and Tendons Subcutaneous edema and scant tracking fluid overlying the anterior and medial compartmental muscles of the thigh. Soft tissues There is an approximately 7.4 x 6.6 x 2.5 cm subcutaneous abscess along the medial aspect of the mid to distal left thigh with adjacent subcutaneous soft tissue fatty induration consistent with cellulitis. This collection is seen at least 19-20 cm from the inguinal crease. Two small lipomas involving the medial compartment muscles are noted, the largest approximately 2 cm. IMPRESSION: 1. Within the medial aspect of the thigh approximately 20 cm caudad from the left inguinal crease is a subcutaneous  abscess measuring 7.4 x 6.6 x 2.5 cm. There is associated skin thickening and subcutaneous fatty induration consistent with cellulitis. 2. No acute osseous appearing abnormality. There is osteoarthritis of the left knee joint and mild degenerative joint space narrowing of the left hip. Electronically Signed   By: Ashley Royalty M.D.   On: 06/01/2017 21:41    Procedures .Marland KitchenIncision and Drainage Date/Time: 06/01/2017 11:53 PM Performed by: Franchot Heidelberg, PA-C Authorized by: Franchot Heidelberg, PA-C   Consent:    Consent obtained:  Verbal   Consent given by:  Patient   Risks discussed:  Bleeding, incomplete drainage, infection and pain Location:    Type:  Abscess Pre-procedure details:    Skin preparation:  Betadine Anesthesia (see MAR for exact dosages):    Anesthesia method:  Local infiltration   Local anesthetic:  Lidocaine 2% WITH epi Procedure type:    Complexity:  Complex Procedure details:    Incision types:  Single straight   Incision depth:  Subcutaneous   Scalpel blade:  11   Wound management:  Probed and deloculated, irrigated with saline and extensive cleaning   Drainage:  Bloody and purulent   Drainage amount:  Copious   Packing materials:  1/4 in iodoform gauze Post-procedure details:    Patient tolerance of procedure:  Tolerated with difficulty   (including  critical care time)  Medications Ordered in ED Medications  dextrose 5 %-0.45 % sodium chloride infusion (not administered)  insulin regular (NOVOLIN R,HUMULIN R) 100 Units in sodium chloride 0.9 % 100 mL (1 Units/mL) infusion ( Intravenous New Bag/Given 06/02/17 0026)  vancomycin (VANCOCIN) 2,000 mg in sodium chloride 0.9 % 500 mL IVPB (2,000 mg Intravenous New Bag/Given 06/02/17 0026)  sodium chloride 0.9 % bolus 1,000 mL (1,000 mLs Intravenous New Bag/Given 06/01/17 2101)  fentaNYL (SUBLIMAZE) injection 50 mcg (50 mcg Intravenous Given 06/01/17 2101)  lidocaine-EPINEPHrine (XYLOCAINE W/EPI) 2 %-1:200000 (PF)  injection 20 mL (20 mLs Infiltration Given 06/02/17 0005)  fentaNYL (SUBLIMAZE) injection 50 mcg (50 mcg Intravenous Given 06/01/17 2254)     Initial Impression / Assessment and Plan / ED Course  I have reviewed the triage vital signs and the nursing notes.  Pertinent labs & imaging results that were available during my care of the patient were reviewed by me and considered in my medical decision making (see chart for details).     Patient returning with continued pain and worsening swelling of medial left thigh.  I&D 8 days ago.  No systemic signs of infection including fever, tachycardia, or hypotension.  Physical exam shows approximately 6 cm indurated abscess of the medial left thigh with surrounding cellulitis.  Will obtain basic labs and CT for further evaluation.  Lactate reassuring.  White count elevated at 15.  Patient is hyperglycemic at 423, slight increase in creatinine.  No abnormality of anion gap or bicarb, doubt DKA.  Fluid started.  Fentanyl given for pain.  CT shows 7.5 x 6.5 x 2.5 cm abscess of the subcu tissue of the left medial thigh.   Abscess was drained with copious foul-smelling purulent material expressed.  IV vancomycin started.  Will consult with hospitalist for admission for continued IV antibiotics and monitoring of abscess.  Glucostabilizer order set started.  Discussed with hospitalist, patient to be admitted.  Final Clinical Impressions(s) / ED Diagnoses   Final diagnoses:  Abscess  Cellulitis of left lower extremity  Hyperglycemia    ED Discharge Orders    None       Franchot Heidelberg, PA-C 06/02/17 0045    Daleen Bo, MD 06/04/17 [phone]

## 2017-06-02 ENCOUNTER — Telehealth: Payer: Self-pay | Admitting: Family Medicine

## 2017-06-02 ENCOUNTER — Other Ambulatory Visit: Payer: Self-pay

## 2017-06-02 ENCOUNTER — Ambulatory Visit: Payer: Medicaid Other | Admitting: Family Medicine

## 2017-06-02 DIAGNOSIS — E1165 Type 2 diabetes mellitus with hyperglycemia: Secondary | ICD-10-CM

## 2017-06-02 DIAGNOSIS — G9341 Metabolic encephalopathy: Secondary | ICD-10-CM

## 2017-06-02 DIAGNOSIS — G92 Toxic encephalopathy: Secondary | ICD-10-CM | POA: Diagnosis not present

## 2017-06-02 DIAGNOSIS — I10 Essential (primary) hypertension: Secondary | ICD-10-CM | POA: Diagnosis not present

## 2017-06-02 DIAGNOSIS — L02416 Cutaneous abscess of left lower limb: Secondary | ICD-10-CM | POA: Diagnosis present

## 2017-06-02 DIAGNOSIS — D62 Acute posthemorrhagic anemia: Secondary | ICD-10-CM | POA: Diagnosis not present

## 2017-06-02 DIAGNOSIS — I352 Nonrheumatic aortic (valve) stenosis with insufficiency: Secondary | ICD-10-CM | POA: Diagnosis present

## 2017-06-02 DIAGNOSIS — E1140 Type 2 diabetes mellitus with diabetic neuropathy, unspecified: Secondary | ICD-10-CM | POA: Diagnosis present

## 2017-06-02 DIAGNOSIS — K219 Gastro-esophageal reflux disease without esophagitis: Secondary | ICD-10-CM | POA: Diagnosis present

## 2017-06-02 DIAGNOSIS — B1920 Unspecified viral hepatitis C without hepatic coma: Secondary | ICD-10-CM | POA: Diagnosis present

## 2017-06-02 DIAGNOSIS — E118 Type 2 diabetes mellitus with unspecified complications: Secondary | ICD-10-CM | POA: Diagnosis not present

## 2017-06-02 DIAGNOSIS — E785 Hyperlipidemia, unspecified: Secondary | ICD-10-CM

## 2017-06-02 DIAGNOSIS — I252 Old myocardial infarction: Secondary | ICD-10-CM | POA: Diagnosis not present

## 2017-06-02 DIAGNOSIS — L7622 Postprocedural hemorrhage and hematoma of skin and subcutaneous tissue following other procedure: Secondary | ICD-10-CM | POA: Diagnosis not present

## 2017-06-02 DIAGNOSIS — E1122 Type 2 diabetes mellitus with diabetic chronic kidney disease: Secondary | ICD-10-CM | POA: Diagnosis present

## 2017-06-02 DIAGNOSIS — I5042 Chronic combined systolic (congestive) and diastolic (congestive) heart failure: Secondary | ICD-10-CM

## 2017-06-02 DIAGNOSIS — R739 Hyperglycemia, unspecified: Secondary | ICD-10-CM | POA: Diagnosis not present

## 2017-06-02 DIAGNOSIS — L03116 Cellulitis of left lower limb: Secondary | ICD-10-CM | POA: Diagnosis present

## 2017-06-02 DIAGNOSIS — L0291 Cutaneous abscess, unspecified: Secondary | ICD-10-CM | POA: Diagnosis not present

## 2017-06-02 DIAGNOSIS — D631 Anemia in chronic kidney disease: Secondary | ICD-10-CM | POA: Diagnosis present

## 2017-06-02 DIAGNOSIS — N184 Chronic kidney disease, stage 4 (severe): Secondary | ICD-10-CM

## 2017-06-02 DIAGNOSIS — M1030 Gout due to renal impairment, unspecified site: Secondary | ICD-10-CM | POA: Diagnosis present

## 2017-06-02 DIAGNOSIS — E669 Obesity, unspecified: Secondary | ICD-10-CM | POA: Diagnosis present

## 2017-06-02 DIAGNOSIS — G4733 Obstructive sleep apnea (adult) (pediatric): Secondary | ICD-10-CM | POA: Diagnosis present

## 2017-06-02 DIAGNOSIS — I2510 Atherosclerotic heart disease of native coronary artery without angina pectoris: Secondary | ICD-10-CM

## 2017-06-02 DIAGNOSIS — F329 Major depressive disorder, single episode, unspecified: Secondary | ICD-10-CM | POA: Diagnosis present

## 2017-06-02 DIAGNOSIS — E871 Hypo-osmolality and hyponatremia: Secondary | ICD-10-CM | POA: Diagnosis present

## 2017-06-02 DIAGNOSIS — Z7902 Long term (current) use of antithrombotics/antiplatelets: Secondary | ICD-10-CM | POA: Diagnosis not present

## 2017-06-02 DIAGNOSIS — I130 Hypertensive heart and chronic kidney disease with heart failure and stage 1 through stage 4 chronic kidney disease, or unspecified chronic kidney disease: Secondary | ICD-10-CM | POA: Diagnosis present

## 2017-06-02 HISTORY — DX: Metabolic encephalopathy: G93.41

## 2017-06-02 LAB — URINALYSIS, ROUTINE W REFLEX MICROSCOPIC
Bilirubin Urine: NEGATIVE
Glucose, UA: 50 mg/dL — AB
Ketones, ur: NEGATIVE mg/dL
Leukocytes, UA: NEGATIVE
Nitrite: NEGATIVE
Protein, ur: 30 mg/dL — AB
Specific Gravity, Urine: 1.01 (ref 1.005–1.030)
pH: 5 (ref 5.0–8.0)

## 2017-06-02 LAB — CBC
HCT: 22.1 % — ABNORMAL LOW (ref 36.0–46.0)
Hemoglobin: 7.4 g/dL — ABNORMAL LOW (ref 12.0–15.0)
MCH: 29.6 pg (ref 26.0–34.0)
MCHC: 33.5 g/dL (ref 30.0–36.0)
MCV: 88.4 fL (ref 78.0–100.0)
Platelets: 288 10*3/uL (ref 150–400)
RBC: 2.5 MIL/uL — ABNORMAL LOW (ref 3.87–5.11)
RDW: 14.5 % (ref 11.5–15.5)
WBC: 12.4 10*3/uL — ABNORMAL HIGH (ref 4.0–10.5)

## 2017-06-02 LAB — GLUCOSE, CAPILLARY
Glucose-Capillary: 377 mg/dL — ABNORMAL HIGH (ref 65–99)
Glucose-Capillary: 395 mg/dL — ABNORMAL HIGH (ref 65–99)
Glucose-Capillary: 271 mg/dL — ABNORMAL HIGH (ref 65–99)
Glucose-Capillary: 249 mg/dL — ABNORMAL HIGH (ref 65–99)

## 2017-06-02 LAB — CBG MONITORING, ED
Glucose-Capillary: 292 mg/dL — ABNORMAL HIGH (ref 65–99)
Glucose-Capillary: 323 mg/dL — ABNORMAL HIGH (ref 65–99)

## 2017-06-02 LAB — I-STAT CG4 LACTIC ACID, ED: Lactic Acid, Venous: 0.47 mmol/L — ABNORMAL LOW (ref 0.5–1.9)

## 2017-06-02 MED ORDER — INSULIN ASPART 100 UNIT/ML ~~LOC~~ SOLN: 0.0000 [IU] | Freq: Three times a day (TID) | SUBCUTANEOUS | Status: DC

## 2017-06-02 MED ORDER — INSULIN ASPART 100 UNIT/ML ~~LOC~~ SOLN
0.0000 [IU] | Freq: Three times a day (TID) | SUBCUTANEOUS | Status: DC
  Administered 2017-06-02: 9 [IU] via SUBCUTANEOUS

## 2017-06-02 MED ORDER — DEXTROSE 5 % IV SOLN
2.0000 g | Freq: Every day | INTRAVENOUS | Status: DC
  Filled 2017-06-02: qty 2

## 2017-06-02 MED ORDER — THROMBIN (RECOMBINANT) 20000 UNITS EX SOLR
20000.0000 [IU] | Freq: Once | CUTANEOUS | Status: DC
  Filled 2017-06-02: qty 20000

## 2017-06-02 MED ORDER — SODIUM CHLORIDE 0.9 % IV SOLN: Freq: Once | INTRAVENOUS | Status: DC

## 2017-06-02 MED ORDER — ALBUTEROL SULFATE (2.5 MG/3ML) 0.083% IN NEBU: 2.5000 mg | INHALATION_SOLUTION | Freq: Four times a day (QID) | RESPIRATORY_TRACT | Status: DC | PRN

## 2017-06-02 MED ORDER — MOMETASONE FURO-FORMOTEROL FUM 200-5 MCG/ACT IN AERO
2.0000 | INHALATION_SPRAY | Freq: Two times a day (BID) | RESPIRATORY_TRACT | Status: DC
  Administered 2017-06-02 – 2017-06-06 (×8): 2 via RESPIRATORY_TRACT
  Filled 2017-06-02 (×2): qty 8.8

## 2017-06-02 MED ORDER — ALLOPURINOL 100 MG PO TABS
100.0000 mg | ORAL_TABLET | Freq: Every day | ORAL | Status: DC
  Administered 2017-06-02 – 2017-06-06 (×5): 100 mg via ORAL
  Filled 2017-06-02 (×5): qty 1

## 2017-06-02 MED ORDER — CARVEDILOL 6.25 MG PO TABS
6.2500 mg | ORAL_TABLET | Freq: Two times a day (BID) | ORAL | Status: DC
  Administered 2017-06-02 – 2017-06-06 (×8): 6.25 mg via ORAL
  Filled 2017-06-02 (×9): qty 1

## 2017-06-02 MED ORDER — SODIUM CHLORIDE 0.9 % IV BOLUS (SEPSIS)
1000.0000 mL | Freq: Once | INTRAVENOUS | Status: AC
  Administered 2017-06-02: 1000 mL via INTRAVENOUS

## 2017-06-02 MED ORDER — VANCOMYCIN HCL 10 G IV SOLR
2000.0000 mg | Freq: Once | INTRAVENOUS | Status: AC
  Administered 2017-06-02: 2000 mg via INTRAVENOUS
  Filled 2017-06-02: qty 2000

## 2017-06-02 MED ORDER — OXYCODONE HCL 5 MG PO TABS
5.0000 mg | ORAL_TABLET | ORAL | Status: DC | PRN
  Administered 2017-06-02 – 2017-06-03 (×4): 5 mg via ORAL
  Filled 2017-06-02 (×4): qty 1

## 2017-06-02 MED ORDER — ACETAMINOPHEN 325 MG PO TABS
650.0000 mg | ORAL_TABLET | Freq: Three times a day (TID) | ORAL | Status: DC | PRN
  Administered 2017-06-02 – 2017-06-06 (×4): 650 mg via ORAL
  Filled 2017-06-02 (×4): qty 2

## 2017-06-02 MED ORDER — TICAGRELOR 90 MG PO TABS
90.0000 mg | ORAL_TABLET | Freq: Two times a day (BID) | ORAL | Status: DC
  Administered 2017-06-02: 90 mg via ORAL
  Filled 2017-06-02: qty 1

## 2017-06-02 MED ORDER — INSULIN ASPART 100 UNIT/ML ~~LOC~~ SOLN
0.0000 [IU] | Freq: Every day | SUBCUTANEOUS | Status: DC
  Administered 2017-06-02: 2 [IU] via SUBCUTANEOUS

## 2017-06-02 MED ORDER — ASPIRIN EC 81 MG PO TBEC
81.0000 mg | DELAYED_RELEASE_TABLET | Freq: Every day | ORAL | Status: DC
  Administered 2017-06-02: 81 mg via ORAL
  Filled 2017-06-02: qty 1

## 2017-06-02 MED ORDER — PANTOPRAZOLE SODIUM 40 MG PO TBEC
40.0000 mg | DELAYED_RELEASE_TABLET | Freq: Every day | ORAL | Status: DC
  Administered 2017-06-02 – 2017-06-06 (×5): 40 mg via ORAL
  Filled 2017-06-02 (×5): qty 1

## 2017-06-02 MED ORDER — ISOSORBIDE MONONITRATE ER 60 MG PO TB24
60.0000 mg | ORAL_TABLET | Freq: Every day | ORAL | Status: DC
  Administered 2017-06-02: 60 mg via ORAL
  Filled 2017-06-02: qty 1

## 2017-06-02 MED ORDER — INSULIN ASPART 100 UNIT/ML ~~LOC~~ SOLN
0.0000 [IU] | Freq: Three times a day (TID) | SUBCUTANEOUS | Status: DC
  Administered 2017-06-02 (×2): 11 [IU] via SUBCUTANEOUS
  Administered 2017-06-03: 7 [IU] via SUBCUTANEOUS
  Administered 2017-06-03 (×2): 11 [IU] via SUBCUTANEOUS
  Administered 2017-06-04 (×2): 3 [IU] via SUBCUTANEOUS
  Administered 2017-06-04 – 2017-06-06 (×5): 4 [IU] via SUBCUTANEOUS

## 2017-06-02 MED ORDER — SODIUM CHLORIDE 0.9 % IV SOLN
INTRAVENOUS | Status: DC
  Administered 2017-06-02 – 2017-06-05 (×2): via INTRAVENOUS

## 2017-06-02 MED ORDER — ISOSORBIDE MONONITRATE ER 60 MG PO TB24
60.0000 mg | ORAL_TABLET | Freq: Every day | ORAL | Status: DC
  Administered 2017-06-03 – 2017-06-06 (×4): 60 mg via ORAL
  Filled 2017-06-02 (×5): qty 1

## 2017-06-02 MED ORDER — FLUTICASONE PROPIONATE 50 MCG/ACT NA SUSP
2.0000 | Freq: Every day | NASAL | Status: DC | PRN
  Filled 2017-06-02: qty 16

## 2017-06-02 MED ORDER — AMLODIPINE BESYLATE 10 MG PO TABS
10.0000 mg | ORAL_TABLET | Freq: Every day | ORAL | Status: DC
  Administered 2017-06-02 – 2017-06-06 (×5): 10 mg via ORAL
  Filled 2017-06-02 (×5): qty 1

## 2017-06-02 MED ORDER — GABAPENTIN 100 MG PO CAPS
200.0000 mg | ORAL_CAPSULE | Freq: Three times a day (TID) | ORAL | Status: DC
  Administered 2017-06-02 – 2017-06-06 (×13): 200 mg via ORAL
  Filled 2017-06-02 (×14): qty 2

## 2017-06-02 MED ORDER — FUROSEMIDE 40 MG PO TABS
40.0000 mg | ORAL_TABLET | Freq: Every day | ORAL | Status: DC
  Administered 2017-06-02 – 2017-06-06 (×5): 40 mg via ORAL
  Filled 2017-06-02 (×5): qty 1

## 2017-06-02 MED ORDER — OXYCODONE HCL 5 MG PO TABS
5.0000 mg | ORAL_TABLET | ORAL | Status: DC | PRN
  Administered 2017-06-02: 10 mg via ORAL
  Filled 2017-06-02: qty 2

## 2017-06-02 MED ORDER — DEXTROSE 5 % IV SOLN: 1.0000 g | Freq: Every day | INTRAVENOUS | Status: DC

## 2017-06-02 MED ORDER — DEXTROSE 5 % IV SOLN
2.0000 g | INTRAVENOUS | Status: DC
  Administered 2017-06-02 – 2017-06-05 (×4): 2 g via INTRAVENOUS
  Filled 2017-06-02 (×6): qty 2

## 2017-06-02 MED ORDER — INSULIN ASPART 100 UNIT/ML ~~LOC~~ SOLN: 0.0000 [IU] | Freq: Every day | SUBCUTANEOUS | Status: DC

## 2017-06-02 MED ORDER — INSULIN ASPART 100 UNIT/ML ~~LOC~~ SOLN: 4.0000 [IU] | Freq: Three times a day (TID) | SUBCUTANEOUS | Status: DC

## 2017-06-02 MED ORDER — ATORVASTATIN CALCIUM 40 MG PO TABS
80.0000 mg | ORAL_TABLET | Freq: Every day | ORAL | Status: DC
  Administered 2017-06-02 – 2017-06-05 (×4): 80 mg via ORAL
  Filled 2017-06-02 (×4): qty 2

## 2017-06-02 MED ORDER — ASPIRIN 81 MG PO CHEW
81.0000 mg | CHEWABLE_TABLET | Freq: Every day | ORAL | Status: DC
  Administered 2017-06-03 – 2017-06-05 (×3): 81 mg via ORAL
  Filled 2017-06-02 (×3): qty 1

## 2017-06-02 MED ORDER — VANCOMYCIN HCL 10 G IV SOLR
2000.0000 mg | INTRAVENOUS | Status: DC
  Filled 2017-06-02: qty 2000

## 2017-06-02 MED ORDER — INSULIN GLARGINE 100 UNIT/ML ~~LOC~~ SOLN
28.0000 [IU] | Freq: Two times a day (BID) | SUBCUTANEOUS | Status: DC
  Administered 2017-06-02 (×3): 28 [IU] via SUBCUTANEOUS
  Filled 2017-06-02 (×4): qty 0.28

## 2017-06-02 NOTE — Progress Notes (Signed)
Patient educated about low hemoglobin and the need for a blood transfusion. Patient is a [employer] development [employer] witness and refusing a transfusion at this time. Patient will not sign refusal of blood products form. Patient would like to talk with family and church members more to make a final decision about receiving blood.

## 2017-06-02 NOTE — Telephone Encounter (Signed)
Noted  

## 2017-06-02 NOTE — Progress Notes (Signed)
A consult was received from an ED physician for vancomycin per pharmacy dosing.  The patient's profile has been reviewed for ht/wt/allergies/indication/available labs.   A one time order has been placed for vancomycin 2gm iv x1.  Further antibiotics/pharmacy consults should be ordered by admitting physician if indicated.                       Thank you, Nani Skillern Crowford 06/02/2017  12:04 AM

## 2017-06-02 NOTE — ED Notes (Signed)
Room 1613 @0026 

## 2017-06-02 NOTE — H&P (Signed)
History and Physical    [name] ASN:[id number] DOB: 12-24-1955 DOA: 06/01/2017  I have briefly reviewed the patient's prior medical records in [place]  PCP: Arnoldo Morale, MD  Patient coming from: Home  Chief Complaint: Left thigh pain  HPI: [name] is a 61 y.o. female with medical history significant of poorly controlled diabetes mellitus, coronary artery disease, chronic combined CHF, hypertension, presents to the emergency room with chief complaint of left thigh pain.  Patient was seen in the emergency room on 05/24/2017 for left thigh abscess, got an I&D at that time and was discharged home on oral antibiotics.  Patient tells me that she took all the antibiotics as directed, however she felt like her left thigh is getting more and more painful and red.  She finished antibiotics, and today the pain was unbearable so she decided to come to the emergency room.  She denies any fevers, complains of occasional chills.  She denies any chest pain or shortness of breath.  She has no abdominal pain, nausea or vomiting.  ED Course: In the emergency room she is afebrile, her heart rate is in the 80s, blood pressure 140-150s and she is satting well on room air.  Blood work reveals a leukocytosis of 15 and a creatinine of 2.5.  CT scan of the thigh show a fairly large subcutaneous abscess.  EDP performed I&D and sent for cultures, patient received vancomycin and we were asked to admit  Review of Systems: As per HPI otherwise 10 point review of systems negative.   Past Medical History:  Diagnosis Date  . Aortic stenosis    Echo 8/18: mean 13, peak 28, LVOT/AV mean velocity 0.51  . Asthma    As a child   . Bronchitis   . CAD (coronary artery disease)    a. 09/2016: 50% Ost 1st Mrg stenosis, 50% 2nd Mrg stenosis, 20% Mid-Cx, 95% Prox LAD, 40% mid-LAD, and 10% dist-LAD stenosis. Staged PCI with DES to Prox-LAD.   [name] Chronic combined systolic and diastolic CHF (congestive heart  failure) ([place]) 2011   echo 2/18: EF 55-60, normal wall motion, grade 2 diastolic dysfunction, trivial AI // echo 3/18: Septal and apical HK, EF 45-50, normal wall motion, trivial AI, mild LAE, PASP 38 // echo 8/18: EF 60-65, normal wall motion, grade 1 diastolic dysfunction, calcified aortic valve leaflets, mild aortic stenosis (mean 13, peak 28, LVOT/AV mean velocity 0.51), mild AI, moderate MAC, mild LAE, trivial TR   . Complication of anesthesia   . Diabetes mellitus Dx 1989  . Hepatitis C Dx 2013  . Hypertension Dx 1989  . Obesity   . Pancreatitis 2013  . Refusal of blood transfusions as patient is Jehovah's Witness   . Tendinitis   . Ulcer 2010    Past Surgical History:  Procedure Laterality Date  . CHOLECYSTECTOMY    . CORONARY STENT INTERVENTION N/A 09/18/2016   Procedure: Coronary Stent Intervention;  Surgeon: Troy Sine, MD;  Location: [facility];  Service: Cardiovascular;  Laterality: N/A;  . EYE SURGERY    . KNEE ARTHROSCOPY    . LEFT HEART CATH N/A 09/18/2016   Procedure: Left Heart Cath;  Surgeon: Troy Sine, MD;  Location: [facility];  Service: Cardiovascular;  Laterality: N/A;  . LEFT HEART CATH AND CORONARY ANGIOGRAPHY N/A 09/16/2016   Procedure: Left Heart Cath and Coronary Angiography;  Surgeon: Burnell Blanks, MD;  Location: [facility];  Service:  Cardiovascular;  Laterality: N/A;  . LEFT HEART CATH AND CORONARY ANGIOGRAPHY N/A 04/29/2017   Procedure: LEFT HEART CATH AND CORONARY ANGIOGRAPHY;  Surgeon: Nelva Bush, MD;  Location: [facility];  Service: Cardiovascular;  Laterality: N/A;  . TUBAL LIGATION    . TUBAL LIGATION       reports that she quit smoking about 36 years ago. she has never used smokeless tobacco. She reports that she drinks alcohol. She reports that she does not use drugs.  Allergies  Allergen Reactions  . Shellfish Allergy Anaphylaxis and Swelling  . Diazepam Other (See Comments)    "felt like out  of body experience"  . Morphine And Related Itching    Family History  Problem Relation Age of Onset  . Colon cancer Mother   . Heart attack Other   . Heart attack Maternal Grandmother   . Hypertension Sister   . Hypertension Brother   . Diabetes Paternal Grandmother   . Breast cancer Neg Hx     Prior to Admission medications   Medication Sig Start Date End Date Taking? Authorizing Provider  acetaminophen-codeine (TYLENOL #3) 300-30 MG tablet Take 1 tablet by mouth every 12 (twelve) hours as needed for severe pain. For osteoarthritis (unable to take NSAIDs due to CKD) 04/14/17  Yes Amao, Charlane Ferretti, MD  albuterol (PROVENTIL) (2.5 MG/3ML) 0.083% nebulizer solution Take 3 mLs (2.5 mg total) by nebulization every 6 (six) hours as needed for wheezing or shortness of breath. 12/17/16  Yes Argentina Donovan, PA-C  albuterol (VENTOLIN HFA) 108 (90 Base) MCG/ACT inhaler Inhale 2 puffs into the lungs every 4 (four) hours as needed for wheezing or shortness of breath. 08/03/16  Yes Funches, Josalyn, MD  allopurinol (ZYLOPRIM) 100 MG tablet Take 1 tablet (100 mg total) by mouth daily. 02/12/17  Yes Funches, Josalyn, MD  amLODipine (NORVASC) 10 MG tablet Take 1 tablet (10 mg total) by mouth daily. 04/14/17  Yes Arnoldo Morale, MD  aspirin EC 81 MG EC tablet Take 1 tablet (81 mg total) by mouth daily. 09/19/16  Yes Strader, [place] M, PA-C  atorvastatin (LIPITOR) 80 MG tablet Take 1 tablet (80 mg total) by mouth daily at 6 PM. 02/12/17  Yes Funches, Josalyn, MD  carvedilol (COREG) 25 MG tablet TAKE 1 TABLET BY MOUTH 2 TIMES DAILY WITH A MEAL. 02/12/17  Yes Funches, Josalyn, MD  colchicine 0.6 MG tablet Take 0.5 tablets (0.3 mg total) by mouth daily. Patient taking differently: Take 0.6 mg by mouth daily.  05/03/17  Yes Velna Hatchet, MD  exenatide (BYETTA 10 MCG PEN) 10 MCG/0.04ML SOPN injection Inject 0.04 mLs (10 mcg total) into the skin 2 (two) times daily with a meal. 04/30/17  Yes Amao, Charlane Ferretti, MD  ferrous  sulfate 325 (65 FE) MG tablet Take 1 tablet (325 mg total) by mouth 2 (two) times daily with a meal. 04/14/17  Yes Amao, Enobong, MD  fluticasone (FLONASE) 50 MCG/ACT nasal spray Place 2 sprays into both nostrils daily as needed for allergies or rhinitis. 01/07/17  Yes Funches, Josalyn, MD  Fluticasone-Salmeterol (ADVAIR DISKUS) 250-50 MCG/DOSE AEPB Inhale 1 puff into the lungs 2 (two) times daily. Patient taking differently: Inhale 1 puff into the lungs 2 (two) times daily as needed (shortness of breath).  12/17/16  Yes Argentina Donovan, PA-C  furosemide (LASIX) 40 MG tablet Take 1 tablet (40 mg total) by mouth daily. 05/12/17 05/12/18 Yes Simmons, Brittainy M, PA-C  gabapentin (NEURONTIN) 100 MG capsule TAKE 2 CAPSULES BY MOUTH  3 TIMES A DAY. Patient taking differently: Take 200 mg by mouth 3 (three) times daily.  05/10/17  Yes Arnoldo Morale, MD  hydrALAZINE (APRESOLINE) 50 MG tablet Take 1 tablet (50 mg total) by mouth 3 (three) times daily. 02/12/17  Yes Funches, Josalyn, MD  HYDROcodone-acetaminophen (NORCO/VICODIN) 5-325 MG tablet Take 1 tablet every 4 (four) hours as needed by mouth for moderate pain. 05/24/17  Yes Jola Schmidt, MD  hydrOXYzine (ATARAX/VISTARIL) 50 MG tablet TAKE 1 TABLET (50 MG TOTAL) BY MOUTH 3 (THREE) TIMES DAILY AS NEEDED. Patient taking differently: Take 50 mg by mouth 3 (three) times daily as needed for itching.  03/02/17  Yes Jegede, Olugbemiga E, MD  insulin aspart (NOVOLOG) 100 UNIT/ML injection 0-12 units subcutaneously 3 times daily before meals according to sliding scale 04/14/17  Yes Amao, Charlane Ferretti, MD  Insulin Glargine (LANTUS SOLOSTAR) 100 UNIT/ML Solostar Pen Inject 40 Units into the skin 2 (two) times daily. Patient taking differently: Inject 30 Units into the skin 2 (two) times daily.  04/14/17  Yes Arnoldo Morale, MD  isosorbide mononitrate (IMDUR) 60 MG 24 hr tablet Take 1 tablet (60 mg total) by mouth daily. 05/05/17  Yes Lyda Jester M, PA-C  ketotifen  (ZADITOR) 0.025 % ophthalmic solution Place 1 drop into both eyes daily as needed (for dry eyes). 07/27/16  Yes Funches, Josalyn, MD  nitroGLYCERIN (NITROSTAT) 0.4 MG SL tablet Place 1 tablet (0.4 mg total) under the tongue every 5 (five) minutes x 3 doses as needed for chest pain. 03/19/17  Yes Barrett, Evelene Croon, PA-C  omeprazole (PRILOSEC) 20 MG capsule Take 1 capsule (20 mg total) by mouth daily. 04/14/17  Yes Arnoldo Morale, MD  pregabalin (LYRICA) 100 MG capsule Take 1 capsule (100 mg total) by mouth 3 (three) times daily. 05/03/17  Yes Arnoldo Morale, MD  ticagrelor (BRILINTA) 90 MG TABS tablet Take 1 tablet (90 mg total) by mouth 2 (two) times daily. 05/05/17  Yes Lyda Jester M, PA-C  valACYclovir (VALTREX) 500 MG tablet Take 1 tablet (500 mg total) by mouth daily. 04/14/17  Yes Arnoldo Morale, MD  Blood Glucose Monitoring Suppl (ACCU-CHEK AVIVA) device Use as instructed daily. 04/14/17   Arnoldo Morale, MD  docusate sodium (COLACE) 100 MG capsule Take 1 capsule (100 mg total) every 12 (twelve) hours by mouth. Patient not taking: Reported on 06/01/2017 05/25/17   Jola Schmidt, MD  doxycycline (VIBRAMYCIN) 100 MG capsule Take 1 capsule (100 mg total) 2 (two) times daily by mouth. Patient not taking: Reported on 05/28/2017 05/24/17   Jola Schmidt, MD  glucose blood (ACCU-CHEK AVIVA) test strip Use as instructed daily 04/14/17   Arnoldo Morale, MD  glucose blood (TRUE METRIX BLOOD GLUCOSE TEST) test strip Use as instructed 03/19/17   Barrett, Evelene Croon, PA-C  Lancet Devices [facility]) lancets Use as instructed daily. 04/14/17   Arnoldo Morale, MD  oxyCODONE-acetaminophen (PERCOCET/ROXICET) 5-325 MG tablet Take 1 tablet by mouth every 6 (six) hours as needed for severe pain. Patient not taking: Reported on 06/01/2017 04/11/17   Dalia Heading, PA-C    Physical Exam: Vitals:   06/01/17 1903 06/01/17 1904 06/01/17 2130 06/01/17 2223  BP: (!) 148/62  (!) 150/61 (!) 150/61  Pulse: 80  84  84  Resp: 19   16  Temp: 99.1 F (37.3 C)     TempSrc: Oral     SpO2: 96%  94% 96%  Weight:  132.5 kg (292 lb)    Height: _0  (1.727 m)  Constitutional: NAD, calm, comfortable Eyes: PERRL, lids and conjunctivae normal ENMT: Mucous membranes are moist. Neck: normal, supple, no masses, no thyromegaly Respiratory: clear to auscultation bilaterally, no wheezing, no crackles. Normal respiratory effort. Cardiovascular: Regular rate and rhythm, 3/6 SEM. No extremity edema. 2+ pedal pulses.  Abdomen: no tenderness, no masses palpated. Bowel sounds positive.  Musculoskeletal: no clubbing / cyanosis. Normal muscle tone.  Skin: Left medial thigh abscess status post I&D with bright red blood via the dressing Neurologic: CN 2-12 grossly intact. Strength 5/5 in all 4.  Psychiatric: Normal judgment and insight. Alert and oriented x 3. Normal mood.   Labs on Admission: I have personally reviewed following labs and imaging studies  CBC: Recent Labs  Lab 06/01/17 2100  WBC 15.1*  NEUTROABS 11.6*  HGB 9.3*  HCT 27.7*  MCV 88.2  PLT 244   Basic Metabolic Panel: Recent Labs  Lab 06/01/17 2100  NA 129*  K 5.1  CL 95*  CO2 22  GLUCOSE 423*  BUN 82*  CREATININE 2.56*  CALCIUM 8.3*   GFR: Estimated Creatinine Clearance: 34.1 mL/min (A) (by C-G formula based on SCr of 2.56 mg/dL (H)). Liver Function Tests: No results for input(s): AST, ALT, ALKPHOS, BILITOT, PROT, ALBUMIN in the last 168 hours. No results for input(s): LIPASE, AMYLASE in the last 168 hours. No results for input(s): AMMONIA in the last 168 hours. Coagulation Profile: No results for input(s): INR, PROTIME in the last 168 hours. Cardiac Enzymes: No results for input(s): CKTOTAL, CKMB, CKMBINDEX, TROPONINI in the last 168 hours. BNP (last 3 results) Recent Labs    11/10/16 1418  PROBNP 202   HbA1C: No results for input(s): HGBA1C in the last 72 hours. CBG: Recent Labs  Lab 06/01/17 2131  06/02/17 0002  GLUCAP 394* 346*   Lipid Profile: No results for input(s): CHOL, HDL, LDLCALC, TRIG, CHOLHDL, LDLDIRECT in the last 72 hours. Thyroid Function Tests: No results for input(s): TSH, T4TOTAL, FREET4, T3FREE, THYROIDAB in the last 72 hours. Anemia Panel: No results for input(s): VITAMINB12, FOLATE, FERRITIN, TIBC, IRON, RETICCTPCT in the last 72 hours. Urine analysis:    Component Value Date/Time   COLORURINE YELLOW 12/12/2016 1948   APPEARANCEUR HAZY (A) 12/12/2016 1948   LABSPEC 1.011 12/12/2016 1948   PHURINE 5.0 12/12/2016 1948   GLUCOSEU >=500 (A) 12/12/2016 1948   HGBUR NEGATIVE 12/12/2016 1948   BILIRUBINUR neg 12/17/2016 1538   KETONESUR NEGATIVE 12/12/2016 1948   PROTEINUR >=300 12/17/2016 1538   PROTEINUR 100 (A) 12/12/2016 1948   UROBILINOGEN 0.2 12/17/2016 1538   UROBILINOGEN 0.2 05/29/2010 2100   NITRITE neg 12/17/2016 1538   NITRITE NEGATIVE 12/12/2016 1948   LEUKOCYTESUR Negative 12/17/2016 1538     Radiological Exams on Admission: Ct Femur Left Wo Contrast  Result Date: 06/01/2017 CLINICAL DATA:  Left thigh swelling and pain. EXAM: CT OF THE LOWER LEFT EXTREMITY WITHOUT CONTRAST TECHNIQUE: Multidetector CT imaging of the lower left extremity was performed according to the standard protocol. COMPARISON:  None. FINDINGS: Bones/Joint/Cartilage Negative for acute fracture or bone destruction. Osteoarthritis of the left hip and knee with joint space narrowing. Spurring of the femoral condyles consistent with osteoarthritis. No suspicious osseous lesions. Ligaments Suboptimally assessed by CT. Muscles and Tendons Subcutaneous edema and scant tracking fluid overlying the anterior and medial compartmental muscles of the thigh. Soft tissues There is an approximately 7.4 x 6.6 x 2.5 cm subcutaneous abscess along the medial aspect of the mid to distal left thigh with adjacent subcutaneous soft tissue  fatty induration consistent with cellulitis. This collection is  seen at least 19-20 cm from the inguinal crease. Two small lipomas involving the medial compartment muscles are noted, the largest approximately 2 cm. IMPRESSION: 1. Within the medial aspect of the thigh approximately 20 cm caudad from the left inguinal crease is a subcutaneous abscess measuring 7.4 x 6.6 x 2.5 cm. There is associated skin thickening and subcutaneous fatty induration consistent with cellulitis. 2. No acute osseous appearing abnormality. There is osteoarthritis of the left knee joint and mild degenerative joint space narrowing of the left hip. Electronically Signed   By: Ashley Royalty M.D.   On: 06/01/2017 21:41    Assessment/Plan Active Problems:   Diabetes mellitus type 2, uncontrolled, with complications (HCC)   Essential hypertension   Obesity   CKD (chronic kidney disease) stage 4, GFR 15-29 ml/min (HCC)   CAD (coronary artery disease)   Hyperlipidemia   Chronic gout due to renal impairment of multiple sites without tophus   Chronic combined systolic and diastolic CHF (congestive heart failure) (HCC)   Aortic stenosis   Abscess   Left thigh abscess/cellulitis -Status post I&D in the emergency room, abscess cultures were sent -CT scan without findings concerning for necrotizing process -Start patient on vancomycin and ceftriaxone, monitor cultures and narrow antibiotics accordingly  Type 2 diabetes mellitus, uncontrolled, with complications -Resume patient's home Lantus at a lower dose, add sliding scale insulin, she does not need an insulin infusion  Chronic kidney disease stage IV -Baseline creatinine anywhere from 2-3, somewhat at baseline  Chronic systolic and diastolic CHF -Most recent 2D echo done in August 2018 showed an EF of 60-65% and grade 1 diastolic dysfunction -Hold Lasix for now in the setting of #1, consider resuming in the morning after rechecking renal function  Coronary artery disease -No reported chest pain  Hypertension -Resume home  medications, hold hydralazine, start if needed but for now continue Imdur, Coreg, Norvasc    DVT prophylaxis: SCDs (held Lovenox due to bleeding at the I&D site, resume Lovenox in the morning if her bleeding has stopped) Code Status: Full code  Family Communication: no family at bedside Disposition Plan: admit to [facility] called: none      Admission status: Inpatient   At the time of admission, it appears that the appropriate admission status for this patient is INPATIENT. This is judged to be reasonable and necessary in order to provide the required high service intensity to ensure the patient's safety given the presenting symptoms, physical exam findings, and initial radiographic and laboratory data in the context of their chronic comorbidities. Current circumstances are in need for IV antibiotic for at least 48 hours, and it is felt to place patient at high risk for further clinical deterioration threatening life, limb, or organ. Moreover, it is my clinical judgment that the patient will require inpatient hospital care spanning beyond 2 midnights from the point of admission and that early discharge would result in unnecessary risk of decompensation and readmission or threat to life, limb or bodily function.   Marzetta Board, MD [facility] Pager [phone]  If 7PM-7AM, please contact night-coverage [email or web address] Password [facility]  06/02/2017, 12:15 AM

## 2017-06-02 NOTE — Telephone Encounter (Signed)
Pt called to informed the PCP to let her know that she is at the hospital and she can't make it for her appt for today

## 2017-06-02 NOTE — Progress Notes (Signed)
Inpatient Diabetes Program Recommendations  AACE/ADA: New Consensus Statement on Inpatient Glycemic Control (2015)  Target Ranges:  Prepandial:   less than 140 mg/dL      Peak postprandial:   less than 180 mg/dL (1-2 hours)      Critically ill patients:  140 - 180 mg/dL   Results for [name], [name] (MRN [id number]) as of 06/02/2017 09:51  Ref. Range 06/01/2017 21:31 06/02/2017 00:02 06/02/2017 00:39 06/02/2017 01:48 06/02/2017 07:23  Glucose-Capillary Latest Ref Range: 65 - 99 mg/dL 394 (H) 346 (H) 323 (H) 292 (H) 377 (H)    Admit with: Thigh Abscess  History: DM, CKD, CHF  Home DM Meds: Lantus 30 units BID       Novolog 0-12 units TID       Byetta 10 mcg BID  Current Insulin Orders: Lantus 28 units BID      Novolog Sensitive Correction Scale/ SSI (0-9 units) TID AC + HS        MD- Note patient saw her PCP at [facility] on 05/28/17.  At that visit, patient was instructed to take: Lantus 40 units BID Novolog 0-12 units TID Byetta 10 mcg BID  Note Lantus and Novolog SSI both started this AM.   Please consider the following:  Start Novolog Meal Coverage: Novolog 6 units TID with meals (hold if pt eats <50% of meal)      --Will follow patient during hospitalization--  Wyn Quaker RN, MSN, CDE Diabetes Coordinator Inpatient Glycemic Control Team Team Pager: [phone] (8a-5p)

## 2017-06-02 NOTE — Consult Note (Signed)
Cardiology Consultation:   Patient ID: [name]; [id number]; 10/08/1955   Admit date: 06/01/2017 Date of Consult: 06/02/2017  Primary Care Provider: Arnoldo Morale, MD Primary Cardiologist: Dr Burt Knack    Patient Profile:   [name] is a 61 y.o. female with a hx of DM, HTN, hyperlipidemia, CKD, HEP C, OSA, CAD who is being seen today for the evaluation of CAD at the request of Aileen Fass MD.   History of Present Illness:   Patient's cardiac history dates back to March 2018 when she presented with a non-ST elevation myocardial infarction. Echocardiogram showed ejection fraction 45-50% with septal and apical hypokinesis. She underwent cardiac catheterization and was found to have a 95% proximal LAD and she had PCI with a Synergy drug-eluting stent. Last echocardiogram August 2018 showed normal LV function, grade 1 diastolic dysfunction, mild aortic stenosis with mean gradient 13 mmHg, mild aortic insufficiency and mild left atrial enlargement. She had repeat cardiac catheterization October 2018 because of recurring chest pain. She was found to have a widely patent mid LAD stent. There was mild to moderate disease in her circumflex, obtuse marginal and diagonal. Left ventricular filling pressure was normal. Patient was admitted earlier today with left thigh pain. It was noted she had recent I&D of left thigh abscess. She was admitted for left thigh abscess/cellulitis and placed on antibiotics. She was noted to have bleeding from her incision site with decreased hemoglobin and cardiology asked to evaluate. Question is whether to hold antiplatelet therapy. Patient has mild dyspnea on exertion but no orthopnea or PND. She has not had recent increased pedal edema, chest pain or syncope. She does complain of left thigh pain.  Past Medical History:  Diagnosis Date  . Aortic stenosis    Echo 8/18: mean 13, peak 28, LVOT/AV mean velocity 0.51  . Asthma    As a child   . Bronchitis   . CAD  (coronary artery disease)    a. 09/2016: 50% Ost 1st Mrg stenosis, 50% 2nd Mrg stenosis, 20% Mid-Cx, 95% Prox LAD, 40% mid-LAD, and 10% dist-LAD stenosis. Staged PCI with DES to Prox-LAD.   [name] Chronic combined systolic and diastolic CHF (congestive heart failure) ([place]) 2011   echo 2/18: EF 55-60, normal wall motion, grade 2 diastolic dysfunction, trivial AI // echo 3/18: Septal and apical HK, EF 45-50, normal wall motion, trivial AI, mild LAE, PASP 38 // echo 8/18: EF 60-65, normal wall motion, grade 1 diastolic dysfunction, calcified aortic valve leaflets, mild aortic stenosis (mean 13, peak 28, LVOT/AV mean velocity 0.51), mild AI, moderate MAC, mild LAE, trivial TR   . Complication of anesthesia   . Diabetes mellitus Dx 1989  . Hepatitis C Dx 2013  . Hypertension Dx 1989  . Obesity   . Pancreatitis 2013  . Refusal of blood transfusions as patient is Jehovah's Witness   . Tendinitis   . Ulcer 2010    Past Surgical History:  Procedure Laterality Date  . CHOLECYSTECTOMY    . CORONARY STENT INTERVENTION N/A 09/18/2016   Procedure: Coronary Stent Intervention;  Surgeon: Troy Sine, MD;  Location: [facility];  Service: Cardiovascular;  Laterality: N/A;  . EYE SURGERY    . KNEE ARTHROSCOPY    . LEFT HEART CATH N/A 09/18/2016   Procedure: Left Heart Cath;  Surgeon: Troy Sine, MD;  Location: [facility];  Service: Cardiovascular;  Laterality: N/A;  . LEFT HEART CATH AND CORONARY ANGIOGRAPHY N/A 09/16/2016   Procedure:  Left Heart Cath and Coronary Angiography;  Surgeon: Burnell Blanks, MD;  Location: [facility];  Service: Cardiovascular;  Laterality: N/A;  . LEFT HEART CATH AND CORONARY ANGIOGRAPHY N/A 04/29/2017   Procedure: LEFT HEART CATH AND CORONARY ANGIOGRAPHY;  Surgeon: Nelva Bush, MD;  Location: [facility];  Service: Cardiovascular;  Laterality: N/A;  . TUBAL LIGATION    . TUBAL LIGATION         Inpatient Medications: Scheduled  Meds: . allopurinol  100 mg Oral Daily  . amLODipine  10 mg Oral Daily  . atorvastatin  80 mg Oral q1800  . carvedilol  6.25 mg Oral BID WC  . furosemide  40 mg Oral Daily  . gabapentin  200 mg Oral TID  . insulin aspart  0-20 Units Subcutaneous TID WC  . insulin aspart  0-5 Units Subcutaneous QHS  . insulin aspart  4 Units Subcutaneous TID WC  . insulin glargine  28 Units Subcutaneous BID  . isosorbide mononitrate  60 mg Oral Daily  . mometasone-formoterol  2 puff Inhalation BID  . pantoprazole  40 mg Oral Daily  . thrombin recombinant  20,000 Units Topical Once   Continuous Infusions: . sodium chloride    . sodium chloride    . cefTRIAXone (ROCEPHIN)  IV 2 g (06/02/17 1250)  . [START ON 06/03/2017] vancomycin     PRN Meds: albuterol, fluticasone, oxyCODONE  Allergies:    Allergies  Allergen Reactions  . Shellfish Allergy Anaphylaxis and Swelling  . Diazepam Other (See Comments)    "felt like out of body experience"  . Morphine And Related Itching    Social History:   Social History   Socioeconomic History  . Marital status: Divorced    Spouse name: Not on file  . Number of children: Not on file  . Years of education: Not on file  . Highest education level: Not on file  Social Needs  . Financial resource strain: Not on file  . Food insecurity - worry: Not on file  . Food insecurity - inability: Not on file  . Transportation needs - medical: Not on file  . Transportation needs - non-medical: Not on file  Occupational History  . Not on file  Tobacco Use  . Smoking status: Former Smoker    Last attempt to quit: 10/25/1980    Years since quitting: 36.6  . Smokeless tobacco: Never Used  . Tobacco comment: quit smoking in 1982  Substance and Sexual Activity  . Alcohol use: Yes    Comment: 3 times in last year  . 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 Sexual activity: Not on file    Comment: Not asked  Other Topics Concern  . Not on file   Social History Narrative  . Not on file    Family History:    Family History  Problem Relation Age of Onset  . Colon cancer Mother   . Heart attack Other   . Heart attack Maternal Grandmother   . Hypertension Sister   . Hypertension Brother   . Diabetes Paternal Grandmother   . Breast cancer Neg Hx      ROS:  Please see the history of present illness.  ROS  Left thigh pain; All other ROS reviewed and negative.     Physical Exam/Data:   Vitals:   06/02/17 0806 06/02/17 0808 06/02/17 0822 06/02/17 1103  BP:   139/60 (!) 131/58  Pulse:  77  Resp:    18  Temp:    98 F (36.7 C)  TempSrc:      SpO2: 97% 97%  100%  Weight:      Height:        Intake/Output Summary (Last 24 hours) at 06/02/2017 1318 Last data filed at 06/02/2017 0958 Gross per 24 hour  Intake 640 ml  Output 1000 ml  Net -360 ml   Filed Weights   06/01/17 1904  Weight: 292 lb (132.5 kg)   Body mass index is 44.4 kg/m.  General:  Well nourished, obese in no acute distress HEENT: normal Lymph: no adenopathy Neck: no JVD Endocrine:  No thryomegaly Vascular: No carotid bruits; FA pulses 2+ bilaterally without bruits  Cardiac:  normal S1, S2; RRR; 2/6 systolic murmur Lungs:  clear to auscultation bilaterally, no wheezing, rhonchi or rales  Abd: soft, nontender, no hepatomegaly  Ext: no edema Musculoskeletal:  Left thigh wrapped; tender to palpation Skin: warm and dry  Neuro:  CNs 2-12 intact, no focal abnormalities noted Psych:  Normal affect   Laboratory Data:  Chemistry Recent Labs  Lab 06/01/17 2100  NA 129*  K 5.1  CL 95*  CO2 22  GLUCOSE 423*  BUN 82*  CREATININE 2.56*  CALCIUM 8.3*  GFRNONAA 19*  GFRAA 22*  ANIONGAP 12    Hematology Recent Labs  Lab 06/01/17 2100 06/02/17 1126  WBC 15.1* 12.4*  RBC 3.14* 2.50*  HGB 9.3* 7.4*  HCT 27.7* 22.1*  MCV 88.2 88.4  MCH 29.6 29.6  MCHC 33.6 33.5  RDW 14.8 14.5  PLT 304 288    Radiology/Studies:  Ct Femur Left Wo  Contrast  Result Date: 06/01/2017 CLINICAL DATA:  Left thigh swelling and pain. EXAM: CT OF THE LOWER LEFT EXTREMITY WITHOUT CONTRAST TECHNIQUE: Multidetector CT imaging of the lower left extremity was performed according to the standard protocol. COMPARISON:  None. FINDINGS: Bones/Joint/Cartilage Negative for acute fracture or bone destruction. Osteoarthritis of the left hip and knee with joint space narrowing. Spurring of the femoral condyles consistent with osteoarthritis. No suspicious osseous lesions. Ligaments Suboptimally assessed by CT. Muscles and Tendons Subcutaneous edema and scant tracking fluid overlying the anterior and medial compartmental muscles of the thigh. Soft tissues There is an approximately 7.4 x 6.6 x 2.5 cm subcutaneous abscess along the medial aspect of the mid to distal left thigh with adjacent subcutaneous soft tissue fatty induration consistent with cellulitis. This collection is seen at least 19-20 cm from the inguinal crease. Two small lipomas involving the medial compartment muscles are noted, the largest approximately 2 cm. IMPRESSION: 1. Within the medial aspect of the thigh approximately 20 cm caudad from the left inguinal crease is a subcutaneous abscess measuring 7.4 x 6.6 x 2.5 cm. There is associated skin thickening and subcutaneous fatty induration consistent with cellulitis. 2. No acute osseous appearing abnormality. There is osteoarthritis of the left knee joint and mild degenerative joint space narrowing of the left hip. Electronically Signed   By: Ashley Royalty M.D.   On: 06/01/2017 21:41    Assessment and Plan:   1. Coronary artery disease-patient has had some difficulties with bleeding from incision site from I&D of abscess and leg. Hemoglobin has decreased. Previous LAD stent was in March or 9 months ago. Okay to hold brilinta but would resume when hemostasis achieved and continue for 12 months following prior PCI. Would continue ASA 81 mg daily uninterrupted.  Continue statin. 2. Chronic diastolic congestive heart failure-she appears  to be dry. I agree with holding Lasix for now. Would resume at discharge as she improves. 3. Hypertension-continue preadmission medications and follow. 4. Cellulitis-antibiotics per primary care.  Other issues per primary care. Please call with questions.  For questions or updates, please contact [place] Please consult [email or web address] for contact info under Cardiology/STEMI.   Signed, Kirk Ruths, MD  06/02/2017 1:18 PM

## 2017-06-02 NOTE — ED Notes (Addendum)
Increasing insulin drip rate from 2.6 units/hr to 4.6 units/hr per Gluco[employer]abilizer.

## 2017-06-02 NOTE — Consult Note (Signed)
[facility] Surgery Consult Note  [name] Oct 26, 1955  [id number].    Requesting MD: Charlynne Cousins Chief Complaint/Reason for Consult: Left thigh abscess, bleeding  HPI:  [name] is a 61yo female PMH CAD on Brillinta and aspirin, admitted to [facility] last night with left thigh abscess. Patient states that this appeared on her proximal/medial thigh nearly 2 weeks ago. She was seen in the ED 11/13 and underwent bedside I&D, discharged on doxycycline. Due to increasing pain and swelling in this area she returned to the ED yesterday. CT scan performed and showed 7.4 x 6.6 x 2.5 cm abscess in the subcutaneous tissue. She underwent repeat bedside I&D in the ED, and was admitted to medicine for IV antibiotics. During dressing change this morning patient had profuse bleeding from I&D site. General surgery consulted for assistance.  Patient reports persistent left thigh pain. It is constant and severe, worse with palpation. Denies fever or chills. WBC 15.1. She is on rocephin and vancomycin.  PMH significant for uncontrolled DM, CKD-IV, CHF, CAD, HTN, Obesity Anticoagulants: Brillinta Lives at home with daughter  ROS: Review of Systems  Constitutional: Negative.   HENT: Negative.   Eyes: Negative.   Respiratory: Negative.   Cardiovascular: Negative.   Gastrointestinal: Negative.   Genitourinary: Negative.   Musculoskeletal: Negative.   Skin:       Left proximal/medial thigh abscess, pain  Neurological: Negative.    All systems reviewed and otherwise negative except for as above  Family History  Problem Relation Age of Onset  . Colon cancer Mother   . Heart attack Other   . Heart attack Maternal Grandmother   . Hypertension Sister   . Hypertension Brother   . Diabetes Paternal Grandmother   . Breast cancer Neg Hx     Past Medical History:  Diagnosis Date  . Aortic stenosis    Echo 8/18: mean 13, peak 28, LVOT/AV mean velocity 0.51  . Asthma    As a child   .  Bronchitis   . CAD (coronary artery disease)    a. 09/2016: 50% Ost 1st Mrg stenosis, 50% 2nd Mrg stenosis, 20% Mid-Cx, 95% Prox LAD, 40% mid-LAD, and 10% dist-LAD stenosis. Staged PCI with DES to Prox-LAD.   [name] Chronic combined systolic and diastolic CHF (congestive heart failure) ([place]) 2011   echo 2/18: EF 55-60, normal wall motion, grade 2 diastolic dysfunction, trivial AI // echo 3/18: Septal and apical HK, EF 45-50, normal wall motion, trivial AI, mild LAE, PASP 38 // echo 8/18: EF 60-65, normal wall motion, grade 1 diastolic dysfunction, calcified aortic valve leaflets, mild aortic stenosis (mean 13, peak 28, LVOT/AV mean velocity 0.51), mild AI, moderate MAC, mild LAE, trivial TR   . Complication of anesthesia   . Diabetes mellitus Dx 1989  . Hepatitis C Dx 2013  . Hypertension Dx 1989  . Obesity   . Pancreatitis 2013  . Refusal of blood transfusions as patient is Jehovah's Witness   . Tendinitis   . Ulcer 2010    Past Surgical History:  Procedure Laterality Date  . CHOLECYSTECTOMY    . CORONARY STENT INTERVENTION N/A 09/18/2016   Procedure: Coronary Stent Intervention;  Surgeon: Troy Sine, MD;  Location: [place] [facility];  Service: Cardiovascular;  Laterality: N/A;  . EYE SURGERY    . KNEE ARTHROSCOPY    . LEFT HEART CATH N/A 09/18/2016   Procedure: Left Heart Cath;  Surgeon: Troy Sine, MD;  Location: [place] [facility];  Service: Cardiovascular;  Laterality: N/A;  . LEFT HEART CATH AND CORONARY ANGIOGRAPHY N/A 09/16/2016   Procedure: Left Heart Cath and Coronary Angiography;  Surgeon: Burnell Blanks, MD;  Location: [place] [facility];  Service: Cardiovascular;  Laterality: N/A;  . LEFT HEART CATH AND CORONARY ANGIOGRAPHY N/A 04/29/2017   Procedure: LEFT HEART CATH AND CORONARY ANGIOGRAPHY;  Surgeon: Nelva Bush, MD;  Location: [place] [facility];  Service: Cardiovascular;  Laterality: N/A;  . TUBAL LIGATION    . TUBAL LIGATION      Social History:  reports  that she quit smoking about 36 years ago. she has never used smokeless tobacco. She reports that she drinks alcohol. She reports that she does not use drugs.  Allergies:  Allergies  Allergen Reactions  . Shellfish Allergy Anaphylaxis and Swelling  . Diazepam Other (See Comments)    "felt like out of body experience"  . Morphine And Related Itching    Medications Prior to Admission  Medication Sig Dispense Refill  . acetaminophen-codeine (TYLENOL #3) 300-30 MG tablet Take 1 tablet by mouth every 12 (twelve) hours as needed for severe pain. For osteoarthritis (unable to take NSAIDs due to CKD) 30 tablet 1  . albuterol (PROVENTIL) (2.5 MG/3ML) 0.083% nebulizer solution Take 3 mLs (2.5 mg total) by nebulization every 6 (six) hours as needed for wheezing or shortness of breath. 150 mL 1  . albuterol (VENTOLIN HFA) 108 (90 Base) MCG/ACT inhaler Inhale 2 puffs into the lungs every 4 (four) hours as needed for wheezing or shortness of breath. 54 g 3  . allopurinol (ZYLOPRIM) 100 MG tablet Take 1 tablet (100 mg total) by mouth daily. 30 tablet 6  . amLODipine (NORVASC) 10 MG tablet Take 1 tablet (10 mg total) by mouth daily. 90 tablet 1  . aspirin EC 81 MG EC tablet Take 1 tablet (81 mg total) by mouth daily.    [name] atorvastatin (LIPITOR) 80 MG tablet Take 1 tablet (80 mg total) by mouth daily at 6 PM. 90 tablet 3  . carvedilol (COREG) 25 MG tablet TAKE 1 TABLET BY MOUTH 2 TIMES DAILY WITH A MEAL. 180 tablet 3  . colchicine 0.6 MG tablet Take 0.5 tablets (0.3 mg total) by mouth daily. (Patient taking differently: Take 0.6 mg by mouth daily. )    . exenatide (BYETTA 10 MCG PEN) 10 MCG/0.04ML SOPN injection Inject 0.04 mLs (10 mcg total) into the skin 2 (two) times daily with a meal. 30 mL 1  . ferrous sulfate 325 (65 FE) MG tablet Take 1 tablet (325 mg total) by mouth 2 (two) times daily with a meal. 180 tablet 1  . fluticasone (FLONASE) 50 MCG/ACT nasal spray Place 2 sprays into both nostrils daily as  needed for allergies or rhinitis. 16 g 5  . Fluticasone-Salmeterol (ADVAIR DISKUS) 250-50 MCG/DOSE AEPB Inhale 1 puff into the lungs 2 (two) times daily. (Patient taking differently: Inhale 1 puff into the lungs 2 (two) times daily as needed (shortness of breath). ) 60 each 3  . furosemide (LASIX) 40 MG tablet Take 1 tablet (40 mg total) by mouth daily. 90 tablet 3  . gabapentin (NEURONTIN) 100 MG capsule TAKE 2 CAPSULES BY MOUTH 3 TIMES A DAY. (Patient taking differently: Take 200 mg by mouth 3 (three) times daily. ) 180 capsule 1  . hydrALAZINE (APRESOLINE) 50 MG tablet Take 1 tablet (50 mg total) by mouth 3 (three) times daily. 270 tablet 3  . HYDROcodone-acetaminophen (NORCO/VICODIN) 5-325 MG  tablet Take 1 tablet every 4 (four) hours as needed by mouth for moderate pain. 15 tablet 0  . hydrOXYzine (ATARAX/VISTARIL) 50 MG tablet TAKE 1 TABLET (50 MG TOTAL) BY MOUTH 3 (THREE) TIMES DAILY AS NEEDED. (Patient taking differently: Take 50 mg by mouth 3 (three) times daily as needed for itching. ) 90 tablet 0  . insulin aspart (NOVOLOG) 100 UNIT/ML injection 0-12 units subcutaneously 3 times daily before meals according to sliding scale 3 vial 5  . Insulin Glargine (LANTUS SOLOSTAR) 100 UNIT/ML Solostar Pen Inject 40 Units into the skin 2 (two) times daily. (Patient taking differently: Inject 30 Units into the skin 2 (two) times daily. ) 24 mL 2  . isosorbide mononitrate (IMDUR) 60 MG 24 hr tablet Take 1 tablet (60 mg total) by mouth daily. 30 tablet 6  . ketotifen (ZADITOR) 0.025 % ophthalmic solution Place 1 drop into both eyes daily as needed (for dry eyes). 10 mL 2  . nitroGLYCERIN (NITROSTAT) 0.4 MG SL tablet Place 1 tablet (0.4 mg total) under the tongue every 5 (five) minutes x 3 doses as needed for chest pain. 25 tablet 12  . omeprazole (PRILOSEC) 20 MG capsule Take 1 capsule (20 mg total) by mouth daily. 90 capsule 1  . pregabalin (LYRICA) 100 MG capsule Take 1 capsule (100 mg total) by mouth 3  (three) times daily. 90 capsule 1  . ticagrelor (BRILINTA) 90 MG TABS tablet Take 1 tablet (90 mg total) by mouth 2 (two) times daily. 180 tablet 2  . valACYclovir (VALTREX) 500 MG tablet Take 1 tablet (500 mg total) by mouth daily. 90 tablet 3  . Blood Glucose Monitoring Suppl (ACCU-CHEK AVIVA) device Use as instructed daily. 1 each 0  . docusate sodium (COLACE) 100 MG capsule Take 1 capsule (100 mg total) every 12 (twelve) hours by mouth. (Patient not taking: Reported on 06/01/2017) 30 capsule 0  . doxycycline (VIBRAMYCIN) 100 MG capsule Take 1 capsule (100 mg total) 2 (two) times daily by mouth. (Patient not taking: Reported on 05/28/2017) 14 capsule 0  . glucose blood (ACCU-CHEK AVIVA) test strip Use as instructed daily 100 each 12  . glucose blood (TRUE METRIX BLOOD GLUCOSE TEST) test strip Use as instructed 100 each 12  . Lancet Devices (ACCU-CHEK SOFTCLIX) lancets Use as instructed daily. 1 each 5  . oxyCODONE-acetaminophen (PERCOCET/ROXICET) 5-325 MG tablet Take 1 tablet by mouth every 6 (six) hours as needed for severe pain. (Patient not taking: Reported on 06/01/2017) 15 tablet 0    Prior to Admission medications   Medication Sig Start Date End Date Taking? Authorizing Provider  acetaminophen-codeine (TYLENOL #3) 300-30 MG tablet Take 1 tablet by mouth every 12 (twelve) hours as needed for severe pain. For osteoarthritis (unable to take NSAIDs due to CKD) 04/14/17  Yes Amao, Charlane Ferretti, MD  albuterol (PROVENTIL) (2.5 MG/3ML) 0.083% nebulizer solution Take 3 mLs (2.5 mg total) by nebulization every 6 (six) hours as needed for wheezing or shortness of breath. 12/17/16  Yes Argentina Donovan, PA-C  albuterol (VENTOLIN HFA) 108 (90 Base) MCG/ACT inhaler Inhale 2 puffs into the lungs every 4 (four) hours as needed for wheezing or shortness of breath. 08/03/16  Yes Funches, Josalyn, MD  allopurinol (ZYLOPRIM) 100 MG tablet Take 1 tablet (100 mg total) by mouth daily. 02/12/17  Yes Funches, Josalyn, MD   amLODipine (NORVASC) 10 MG tablet Take 1 tablet (10 mg total) by mouth daily. 04/14/17  Yes Arnoldo Morale, MD  aspirin EC 81 MG EC  tablet Take 1 tablet (81 mg total) by mouth daily. 09/19/16  Yes Strader, [place] M, PA-C  atorvastatin (LIPITOR) 80 MG tablet Take 1 tablet (80 mg total) by mouth daily at 6 PM. 02/12/17  Yes Funches, Josalyn, MD  carvedilol (COREG) 25 MG tablet TAKE 1 TABLET BY MOUTH 2 TIMES DAILY WITH A MEAL. 02/12/17  Yes Funches, Josalyn, MD  colchicine 0.6 MG tablet Take 0.5 tablets (0.3 mg total) by mouth daily. Patient taking differently: Take 0.6 mg by mouth daily.  05/03/17  Yes Velna Hatchet, MD  exenatide (BYETTA 10 MCG PEN) 10 MCG/0.04ML SOPN injection Inject 0.04 mLs (10 mcg total) into the skin 2 (two) times daily with a meal. 04/30/17  Yes Amao, Charlane Ferretti, MD  ferrous sulfate 325 (65 FE) MG tablet Take 1 tablet (325 mg total) by mouth 2 (two) times daily with a meal. 04/14/17  Yes Amao, Enobong, MD  fluticasone (FLONASE) 50 MCG/ACT nasal spray Place 2 sprays into both nostrils daily as needed for allergies or rhinitis. 01/07/17  Yes Funches, Josalyn, MD  Fluticasone-Salmeterol (ADVAIR DISKUS) 250-50 MCG/DOSE AEPB Inhale 1 puff into the lungs 2 (two) times daily. Patient taking differently: Inhale 1 puff into the lungs 2 (two) times daily as needed (shortness of breath).  12/17/16  Yes Argentina Donovan, PA-C  furosemide (LASIX) 40 MG tablet Take 1 tablet (40 mg total) by mouth daily. 05/12/17 05/12/18 Yes Simmons, Brittainy M, PA-C  gabapentin (NEURONTIN) 100 MG capsule TAKE 2 CAPSULES BY MOUTH 3 TIMES A DAY. Patient taking differently: Take 200 mg by mouth 3 (three) times daily.  05/10/17  Yes Arnoldo Morale, MD  hydrALAZINE (APRESOLINE) 50 MG tablet Take 1 tablet (50 mg total) by mouth 3 (three) times daily. 02/12/17  Yes Funches, Josalyn, MD  HYDROcodone-acetaminophen (NORCO/VICODIN) 5-325 MG tablet Take 1 tablet every 4 (four) hours as needed by mouth for moderate pain. 05/24/17   Yes Jola Schmidt, MD  hydrOXYzine (ATARAX/VISTARIL) 50 MG tablet TAKE 1 TABLET (50 MG TOTAL) BY MOUTH 3 (THREE) TIMES DAILY AS NEEDED. Patient taking differently: Take 50 mg by mouth 3 (three) times daily as needed for itching.  03/02/17  Yes Jegede, Olugbemiga E, MD  insulin aspart (NOVOLOG) 100 UNIT/ML injection 0-12 units subcutaneously 3 times daily before meals according to sliding scale 04/14/17  Yes Amao, Charlane Ferretti, MD  Insulin Glargine (LANTUS SOLOSTAR) 100 UNIT/ML Solostar Pen Inject 40 Units into the skin 2 (two) times daily. Patient taking differently: Inject 30 Units into the skin 2 (two) times daily.  04/14/17  Yes Arnoldo Morale, MD  isosorbide mononitrate (IMDUR) 60 MG 24 hr tablet Take 1 tablet (60 mg total) by mouth daily. 05/05/17  Yes Lyda Jester M, PA-C  ketotifen (ZADITOR) 0.025 % ophthalmic solution Place 1 drop into both eyes daily as needed (for dry eyes). 07/27/16  Yes Funches, Josalyn, MD  nitroGLYCERIN (NITROSTAT) 0.4 MG SL tablet Place 1 tablet (0.4 mg total) under the tongue every 5 (five) minutes x 3 doses as needed for chest pain. 03/19/17  Yes Barrett, Evelene Croon, PA-C  omeprazole (PRILOSEC) 20 MG capsule Take 1 capsule (20 mg total) by mouth daily. 04/14/17  Yes Arnoldo Morale, MD  pregabalin (LYRICA) 100 MG capsule Take 1 capsule (100 mg total) by mouth 3 (three) times daily. 05/03/17  Yes Arnoldo Morale, MD  ticagrelor (BRILINTA) 90 MG TABS tablet Take 1 tablet (90 mg total) by mouth 2 (two) times daily. 05/05/17  Yes Simmons, Brittainy M, PA-C  valACYclovir (VALTREX) 500 MG  tablet Take 1 tablet (500 mg total) by mouth daily. 04/14/17  Yes Arnoldo Morale, MD  Blood Glucose Monitoring Suppl (ACCU-CHEK AVIVA) device Use as instructed daily. 04/14/17   Arnoldo Morale, MD  docusate sodium (COLACE) 100 MG capsule Take 1 capsule (100 mg total) every 12 (twelve) hours by mouth. Patient not taking: Reported on 06/01/2017 05/25/17   Jola Schmidt, MD  doxycycline (VIBRAMYCIN) 100 MG  capsule Take 1 capsule (100 mg total) 2 (two) times daily by mouth. Patient not taking: Reported on 05/28/2017 05/24/17   Jola Schmidt, MD  glucose blood (ACCU-CHEK AVIVA) test strip Use as instructed daily 04/14/17   Arnoldo Morale, MD  glucose blood (TRUE METRIX BLOOD GLUCOSE TEST) test strip Use as instructed 03/19/17   Barrett, Evelene Croon, PA-C  Lancet Devices [facility]) lancets Use as instructed daily. 04/14/17   Arnoldo Morale, MD  oxyCODONE-acetaminophen (PERCOCET/ROXICET) 5-325 MG tablet Take 1 tablet by mouth every 6 (six) hours as needed for severe pain. Patient not taking: Reported on 06/01/2017 04/11/17   Dalia Heading, PA-C    Blood pressure (!) 131/58, pulse 77, temperature 98 F (36.7 C), resp. rate 18, height _0  (1.727 m), weight 292 lb (132.5 kg), SpO2 100 %. Physical Exam: General: pleasant, WD/WN AA female who is laying in bed in NAD HEENT: head is normocephalic, atraumatic.  Sclera are noninjected.  Pupils equal and round.  Ears and nose without any masses or lesions.  Mouth is pink and moist. Dentition fair Heart: regular, rate, and rhythm.  +murmur. Palpable pedal pulses bilaterally Lungs: CTAB, no wheezes, rhonchi, or rales noted.  Respiratory effort nonlabored Abd: obese, soft, NT/ND, +BS, no masses, hernias, or organomegaly MS: all 4 extremities are symmetrical with no cyanosis, clubbing, or edema. Skin: warm and dry with no other masses, lesions, or rashes Psych: A&Ox3 with an appropriate affect. Neuro: cranial nerves grossly intact, extremity CSM intact bilaterally, normal speech LLE: left proximal/medial thigh abscess s/p I&D with packing tightly in place, no active bleeding. Induration noted around incision but no fluctuance. There is cellulitis and few intact blisters in the skin surrounding wound >> dry gauze placed over wound and ACE tightly wrapped around thigh  Results for orders placed or performed during the hospital encounter of 06/01/17 (from  the past 48 hour(s))  CBC with Differential     Status: Abnormal   Collection Time: 06/01/17  9:00 PM  Result Value Ref Range   WBC 15.1 (H) 4.0 - 10.5 K/uL   RBC 3.14 (L) 3.87 - 5.11 MIL/uL   Hemoglobin 9.3 (L) 12.0 - 15.0 g/dL   HCT 27.7 (L) 36.0 - 46.0 %   MCV 88.2 78.0 - 100.0 fL   MCH 29.6 26.0 - 34.0 pg   MCHC 33.6 30.0 - 36.0 g/dL   RDW 14.8 11.5 - 15.5 %   Platelets 304 150 - 400 K/uL   Neutrophils Relative % 77 %   Neutro Abs 11.6 (H) 1.7 - 7.7 K/uL   Lymphocytes Relative 14 %   Lymphs Abs 2.2 0.7 - 4.0 K/uL   Monocytes Relative 7 %   Monocytes Absolute 1.1 (H) 0.1 - 1.0 K/uL   Eosinophils Relative 2 %   Eosinophils Absolute 0.2 0.0 - 0.7 K/uL   Basophils Relative 0 %   Basophils Absolute 0.0 0.0 - 0.1 K/uL  Basic metabolic panel     Status: Abnormal   Collection Time: 06/01/17  9:00 PM  Result Value Ref Range   Sodium 129 (L) 135 -  145 mmol/L   Potassium 5.1 3.5 - 5.1 mmol/L   Chloride 95 (L) 101 - 111 mmol/L   CO2 22 22 - 32 mmol/L   Glucose, Bld 423 (H) 65 - 99 mg/dL   BUN 82 (H) 6 - 20 mg/dL   Creatinine, Ser 2.56 (H) 0.44 - 1.00 mg/dL   Calcium 8.3 (L) 8.9 - 10.3 mg/dL   GFR calc non Af Amer 19 (L) >60 mL/min   GFR calc Af Amer 22 (L) >60 mL/min    Comment: (NOTE) The eGFR has been calculated using the CKD EPI equation. This calculation has not been validated in all clinical situations. eGFR's persistently <60 mL/min signify possible Chronic Kidney Disease.    Anion gap 12 5 - 15  I-Stat CG4 Lactic Acid, ED     Status: None   Collection Time: 06/01/17  9:18 PM  Result Value Ref Range   Lactic Acid, Venous 1.11 0.5 - 1.9 mmol/L  CBG monitoring, ED     Status: Abnormal   Collection Time: 06/01/17  9:31 PM  Result Value Ref Range   Glucose-Capillary 394 (H) 65 - 99 mg/dL  POC CBG, ED     Status: Abnormal   Collection Time: 06/02/17 12:02 AM  Result Value Ref Range   Glucose-Capillary 346 (H) 65 - 99 mg/dL  Wound or Superficial Culture     Status: None  (Preliminary result)   Collection Time: 06/02/17 12:20 AM  Result Value Ref Range   Specimen Description ABSCESS LEFT THIGH    Special Requests NONE    Gram Stain      FEW WBC PRESENT,BOTH PMN AND MONONUCLEAR MODERATE GRAM POSITIVE COCCI IN CHAINS MODERATE GRAM NEGATIVE RODS Performed at [facility], [place] [address], [place], [place] [address]    Culture PENDING    Report Status PENDING   I-Stat CG4 Lactic Acid, ED     Status: Abnormal   Collection Time: 06/02/17 12:34 AM  Result Value Ref Range   Lactic Acid, Venous 0.47 (L) 0.5 - 1.9 mmol/L  CBG monitoring, ED     Status: Abnormal   Collection Time: 06/02/17 12:39 AM  Result Value Ref Range   Glucose-Capillary 323 (H) 65 - 99 mg/dL  CBG monitoring, ED     Status: Abnormal   Collection Time: 06/02/17  1:48 AM  Result Value Ref Range   Glucose-Capillary 292 (H) 65 - 99 mg/dL  Glucose, capillary     Status: Abnormal   Collection Time: 06/02/17  7:23 AM  Result Value Ref Range   Glucose-Capillary 377 (H) 65 - 99 mg/dL   Ct Femur Left Wo Contrast  Result Date: 06/01/2017 CLINICAL DATA:  Left thigh swelling and pain. EXAM: CT OF THE LOWER LEFT EXTREMITY WITHOUT CONTRAST TECHNIQUE: Multidetector CT imaging of the lower left extremity was performed according to the standard protocol. COMPARISON:  None. FINDINGS: Bones/Joint/Cartilage Negative for acute fracture or bone destruction. Osteoarthritis of the left hip and knee with joint space narrowing. Spurring of the femoral condyles consistent with osteoarthritis. No suspicious osseous lesions. Ligaments Suboptimally assessed by CT. Muscles and Tendons Subcutaneous edema and scant tracking fluid overlying the anterior and medial compartmental muscles of the thigh. Soft tissues There is an approximately 7.4 x 6.6 x 2.5 cm subcutaneous abscess along the medial aspect of the mid to distal left thigh with adjacent subcutaneous soft tissue fatty induration consistent with cellulitis. This  collection is seen at least 19-20 cm from the inguinal crease. Two small lipomas  involving the medial compartment muscles are noted, the largest approximately 2 cm. IMPRESSION: 1. Within the medial aspect of the thigh approximately 20 cm caudad from the left inguinal crease is a subcutaneous abscess measuring 7.4 x 6.6 x 2.5 cm. There is associated skin thickening and subcutaneous fatty induration consistent with cellulitis. 2. No acute osseous appearing abnormality. There is osteoarthritis of the left knee joint and mild degenerative joint space narrowing of the left hip. Electronically Signed   By: Ashley Royalty M.D.   On: 06/01/2017 21:41    Anti-infectives (From admission, onward)   Start     Dose/Rate Route Frequency Ordered Stop   06/03/17 1200  vancomycin (VANCOCIN) 2,000 mg in sodium chloride 0.9 % 500 mL IVPB     2,000 mg 250 mL/hr over 120 Minutes Intravenous Every 36 hours 06/02/17 0412     06/02/17 2200  cefTRIAXone (ROCEPHIN) 1 g in dextrose 5 % 50 mL IVPB  Status:  Discontinued     1 g 100 mL/hr over 30 Minutes Intravenous Daily at bedtime 06/02/17 0234 06/02/17 1117   06/02/17 1200  cefTRIAXone (ROCEPHIN) 2 g in dextrose 5 % 50 mL IVPB  Status:  Discontinued     2 g 100 mL/hr over 30 Minutes Intravenous Daily at bedtime 06/02/17 1117 06/02/17 1125   06/02/17 1200  cefTRIAXone (ROCEPHIN) 2 g in dextrose 5 % 50 mL IVPB     2 g 100 mL/hr over 30 Minutes Intravenous Every 24 hours 06/02/17 1125     06/02/17 0000  vancomycin (VANCOCIN) 2,000 mg in sodium chloride 0.9 % 500 mL IVPB     2,000 mg 250 mL/hr over 120 Minutes Intravenous  Once 06/02/17 0003 06/02/17 0226       Assessment/Plan DM, uncontrolled CKD-IV Chronic systolic and diastolic CHF CAD - on Brillinta HTN Obesity  Left proximal/medial thigh abscess - s/p bedside I&D in the ED 11/13, discharged on doxycycline - returned to the ED 11/20 due to increased pain and swelling - CT scan shows 7.4 x 6.6 x 2.5 cm abscess  in the subcutaneous tissue - s/p bedside I&D in the ED 11/20; cultures pending - general surgery consulted today due to bleeding  ID - rocephin 11/21>>, vancomycin 11/21>> VTE - SCDs FEN - HH/CM diet Foley - none  Plan - Bleeding seems to have stopped. ACE was wrapped tightly around wound. Leave dressing in place x24 hours, reinforce with ABD pads as needed for saturation. Hold anticoagulants. Thrombin spray has been ordered and will be at bedside if needed during dressing change tomorrow. Continue IV antibiotics and follow culture.  Wellington Hampshire, [facility] Surgery 06/02/2017, 11:42 AM Pager: [phone] Consults: [phone] Mon-Fri 7:00 am-4:30 pm Sat-Sun 7:00 am-11:30 am

## 2017-06-02 NOTE — Progress Notes (Signed)
Pharmacy Antibiotic Note  [name] is a 61 y.o. female admitted on 06/01/2017 with wound infection, abscess.  Pharmacy has been consulted for vancomycin dosing.  Plan: Vancomycin 2gm iv x1, then 2gm iv q36hr Goal AUC = 400 - 500 for all indications, except meningitis (goal AUC > 500 and Cmin 15-20 mcg/mL)   Height: 5\' 8"  (172.7 cm) Weight: 292 lb (132.5 kg) IBW/kg (Calculated) : 63.9  Temp (24hrs), Avg:99 F (37.2 C), Min:98.8 F (37.1 C), Max:99.1 F (37.3 C)  Recent Labs  Lab 06/01/17 2100 06/01/17 2118 06/02/17 0034  WBC 15.1*  --   --   CREATININE 2.56*  --   --   LATICACIDVEN  --  1.11 0.47*    Estimated Creatinine Clearance: 34.1 mL/min (A) (by C-G formula based on SCr of 2.56 mg/dL (H)).    Allergies  Allergen Reactions  . Shellfish Allergy Anaphylaxis and Swelling  . Diazepam Other (See Comments)    "felt like out of body experience"  . Morphine And Related Itching    Antimicrobials this admission: Vancomycin 06/02/2017 >> Ceftriaxone 06/02/2017 >>   Dose adjustments this admission: -  Microbiology results: pending  Thank you for allowing pharmacy to be a part of this patient's care.  Nani Skillern Crowford 06/02/2017 4:13 AM

## 2017-06-02 NOTE — Progress Notes (Addendum)
[facility] PROGRESS NOTE    Progress Note  [name]  NOB:[id number] DOB: 09-Jun-1956 DOA: 06/01/2017 PCP: Arnoldo Morale, MD     Brief Narrative:   [name] is an 61 y.o. female past medical history of poorly controlled diabetes mellitus, coronary artery sees, chronic combined heart failure, recently discharged from the hospital on 05/24/2017 after and I indeed of the left thigh was discharged home on oral antibiotics which she completed treatment comes in for left thigh pain, CT scan of the thigh show fairly large amount of subcutaneous fluid collection the ED perform I and D and send cultures, she was started empirically on IV vancomycin and Rocephin  Assessment/Plan:   Abscess of left thigh: CT scan of the left thigh show no foreign things of necrotizing fasciitis. The abscess was I indeed in the emergency room cultures were sent. She was started empirically on IV vancomycin and Rocephin. The nurse called me that she was losing large amounts of blood from incision site, we'll repeat a CBC stat and consult cardiology regarding holding her aspirin and Berllintaa Will consult general surgery. Ordered a liter of normal saline, hemoglobin seems to be at baseline. Check CBC stat transfuse as needed. She remained afebrile  Acute metabolic encephalopathy: As per chart the patient was in her usual state on admission-she seems confused to answer yes and no question she doesn't know where she is at. Unlikely due to hyponatremia, could be due to narcotics which she received on admission. CBC showed a hemoglobin of 7.4 we will transfuse 1 unit of packed red blood cells and recheck a CBC 2 hours posttransfusion.  Hyponatremia: Her sodium on 05/21/2017 was 140, today 129. Likely due to D5W we'll discontinue D5 started on normal saline. Basic metabolic panel in the morning.  Diabetes mellitus type 2, uncontrolled, with complications (HCC) Discontinued D5W, continue sliding scale  insulin and long-acting.  Chronic kidney disease stage IV: Baseline creatinine is 2-3.  Chronic combined systolic and diastolic heart failure: Lasix was held on admission.  Coronary artery disease: She denies any chest pain, will hold aspirin and Berllintaa.  Essential hypertension: Hold antihypertensive medications except Coreg.    Obesity Counseling.  Acute blood loss anemia: Likely due to oozing from her incision, hemoglobin went from 9.3-7.4 will transfuse 1 unit of packed red blood cells as she is currently symptomatic.   DVT prophylaxis: Scd Family Communication:none Disposition Plan/Barrier to D/C: unable to determine Code Status:     Code Status Orders  (From admission, onward)        Start     Ordered   06/02/17 0235  Full code  Continuous     06/02/17 0234    Code Status History    Date Active Date Inactive Code Status Order ID Comments User Context   04/28/2017 05:16 05/02/2017 16:24 Full Code [id number]  Rise Patience, MD ED   03/17/2017 12:22 03/19/2017 20:30 Full Code [id number]  Liliane Shi, PA-C ED   12/13/2016 03:45 12/14/2016 16:47 Full Code [id number]  Reubin Milan, MD Inpatient   09/13/2016 11:49 09/19/2016 14:58 Full Code [id number]  Flossie Dibble, MD ED   09/04/2016 01:37 09/05/2016 18:01 Full Code [id number]  Etta Quill, DO ED   08/21/2016 18:46 08/22/2016 17:33 Full Code [id number]  Thurnell Lose, MD Inpatient   03/18/2015 09:39 03/19/2015 18:02 Full Code [id number]  Dellia Nims, MD Inpatient   02/15/2015 17:00 02/18/2015 18:17 Full Code [id number]  Francesca Oman, DO Inpatient  10/02/2014 15:24 10/04/2014 16:49 Full Code [id number]  Samella Parr, NP Inpatient   03/25/2012 05:31 04/03/2012 19:31 Full Code [id number]  Maudry Diego, RN Inpatient        IV Access:    Peripheral IV   Procedures and diagnostic studies:   Ct Femur Left Wo Contrast  Result Date: 06/01/2017 CLINICAL DATA:  Left thigh swelling and pain. EXAM: CT OF THE  LOWER LEFT EXTREMITY WITHOUT CONTRAST TECHNIQUE: Multidetector CT imaging of the lower left extremity was performed according to the standard protocol. COMPARISON:  None. FINDINGS: Bones/Joint/Cartilage Negative for acute fracture or bone destruction. Osteoarthritis of the left hip and knee with joint space narrowing. Spurring of the femoral condyles consistent with osteoarthritis. No suspicious osseous lesions. Ligaments Suboptimally assessed by CT. Muscles and Tendons Subcutaneous edema and scant tracking fluid overlying the anterior and medial compartmental muscles of the thigh. Soft tissues There is an approximately 7.4 x 6.6 x 2.5 cm subcutaneous abscess along the medial aspect of the mid to distal left thigh with adjacent subcutaneous soft tissue fatty induration consistent with cellulitis. This collection is seen at least 19-20 cm from the inguinal crease. Two small lipomas involving the medial compartment muscles are noted, the largest approximately 2 cm. IMPRESSION: 1. Within the medial aspect of the thigh approximately 20 cm caudad from the left inguinal crease is a subcutaneous abscess measuring 7.4 x 6.6 x 2.5 cm. There is associated skin thickening and subcutaneous fatty induration consistent with cellulitis. 2. No acute osseous appearing abnormality. There is osteoarthritis of the left knee joint and mild degenerative joint space narrowing of the left hip. Electronically Signed   By: Ashley Royalty M.D.   On: 06/01/2017 21:41     Medical Consultants:    None.  Anti-Infectives:   IV vancomycin and Rocephin  Subjective:    [name] she is significantly confused and unable to answer questions.  Objective:    Vitals:   06/02/17 0806 06/02/17 0808 06/02/17 0822 06/02/17 1103  BP:   139/60 (!) 131/58  Pulse:    77  Resp:    18  Temp:    98 F (36.7 C)  TempSrc:      SpO2: 97% 97%  100%  Weight:      Height:        Intake/Output Summary (Last 24 hours) at 06/02/2017  1104 Last data filed at 06/02/2017 0958 Gross per 24 hour  Intake 640 ml  Output 1000 ml  Net -360 ml   Filed Weights   06/01/17 1904  Weight: 132.5 kg (292 lb)    Exam: General exam: In no acute distress. Respiratory system: Good air movement and clear to auscultation. Cardiovascular system: S1 & S2 heard, RRR. Gastrointestinal system: Abdomen is nondistended, soft and nontender.  Central nervous system: She is only answer simple yes and no questions she's not alert or oriented to time place or person nonfocal deficits Extremities: No pedal edema. Skin: No rashes, lesions or ulcers Psychiatry: Confused   Data Reviewed:    Labs: Basic Metabolic Panel: Recent Labs  Lab 06/01/17 2100  NA 129*  K 5.1  CL 95*  CO2 22  GLUCOSE 423*  BUN 82*  CREATININE 2.56*  CALCIUM 8.3*   GFR Estimated Creatinine Clearance: 34.1 mL/min (A) (by C-G formula based on SCr of 2.56 mg/dL (H)). Liver Function Tests: No results for input(s): AST, ALT, ALKPHOS, BILITOT, PROT, ALBUMIN in the last 168 hours. No results for input(s): LIPASE, AMYLASE in  the last 168 hours. No results for input(s): AMMONIA in the last 168 hours. Coagulation profile No results for input(s): INR, PROTIME in the last 168 hours.  CBC: Recent Labs  Lab 06/01/17 2100  WBC 15.1*  NEUTROABS 11.6*  HGB 9.3*  HCT 27.7*  MCV 88.2  PLT 304   Cardiac Enzymes: No results for input(s): CKTOTAL, CKMB, CKMBINDEX, TROPONINI in the last 168 hours. BNP (last 3 results) Recent Labs    11/10/16 1418  PROBNP 202   CBG: Recent Labs  Lab 06/01/17 2131 06/02/17 0002 06/02/17 0039 06/02/17 0148 06/02/17 0723  GLUCAP 394* 346* 323* 292* 377*   D-Dimer: No results for input(s): DDIMER in the last 72 hours. Hgb A1c: No results for input(s): HGBA1C in the last 72 hours. Lipid Profile: No results for input(s): CHOL, HDL, LDLCALC, TRIG, CHOLHDL, LDLDIRECT in the last 72 hours. Thyroid function studies: No results  for input(s): TSH, T4TOTAL, T3FREE, THYROIDAB in the last 72 hours.  Invalid input(s): FREET3 Anemia work up: No results for input(s): VITAMINB12, FOLATE, FERRITIN, TIBC, IRON, RETICCTPCT in the last 72 hours. Sepsis Labs: Recent Labs  Lab 06/01/17 2100 06/01/17 2118 06/02/17 0034  WBC 15.1*  --   --   LATICACIDVEN  --  1.11 0.47*   Microbiology Recent Results (from the past 240 hour(s))  Wound or Superficial Culture     Status: None (Preliminary result)   Collection Time: 06/02/17 12:20 AM  Result Value Ref Range Status   Specimen Description ABSCESS LEFT THIGH  Final   Special Requests NONE  Final   Gram Stain   Final    FEW WBC PRESENT,BOTH PMN AND MONONUCLEAR MODERATE GRAM POSITIVE COCCI IN CHAINS MODERATE GRAM NEGATIVE RODS Performed at [facility], [place] [address], [place], [place] [address]    Culture PENDING  Incomplete   Report Status PENDING  Incomplete     Medications:   . allopurinol  100 mg Oral Daily  . amLODipine  10 mg Oral Daily  . atorvastatin  80 mg Oral q1800  . carvedilol  6.25 mg Oral BID WC  . gabapentin  200 mg Oral TID  . insulin aspart  0-5 Units Subcutaneous QHS  . insulin aspart  0-9 Units Subcutaneous TID WC  . insulin glargine  28 Units Subcutaneous BID  . mometasone-formoterol  2 puff Inhalation BID  . pantoprazole  40 mg Oral Daily   Continuous Infusions: . sodium chloride    . cefTRIAXone (ROCEPHIN)  IV    . sodium chloride    . [START ON 06/03/2017] vancomycin        LOS: 0 days   Charlynne Cousins  [facility] Pager [phone]  *Please refer to [email or web address], password [email or web address] to get updated schedule on who will round on this patient, as hospitalists switch teams weekly. If 7PM-7AM, please contact night-coverage at [email or web address], password [email or web address] for any overnight needs.  06/02/2017, 11:04 AM

## 2017-06-02 NOTE — ED Notes (Signed)
Patient cannot void at this time due to urinating before. Patient states they will notify staff when able to give a urine sample.

## 2017-06-02 NOTE — Progress Notes (Signed)
Report received from [place] and pt received to room 1613 via stretcher. Pt wanted to ambulate to BR, assisted pt to BR with front wheel RW. Pt fairly steady. Wound to left upper thigh dripping serosang to floor with this ambulation. Pt assisted to bed after BR visit. Oriented to hospital bed and call bell system. Educated to call for assist and to not get OOB by herself. Pt verbalized understanding. Saturated dressing to left thigh changed. 4x4, ABD with paper tape and pad over leg. Pt tolerated well.

## 2017-06-03 DIAGNOSIS — D62 Acute posthemorrhagic anemia: Secondary | ICD-10-CM

## 2017-06-03 DIAGNOSIS — R739 Hyperglycemia, unspecified: Secondary | ICD-10-CM

## 2017-06-03 LAB — CBC
HCT: 24.7 % — ABNORMAL LOW (ref 36.0–46.0)
Hemoglobin: 8.2 g/dL — ABNORMAL LOW (ref 12.0–15.0)
MCH: 29.3 pg (ref 26.0–34.0)
MCHC: 33.2 g/dL (ref 30.0–36.0)
MCV: 88.2 fL (ref 78.0–100.0)
Platelets: 272 10*3/uL (ref 150–400)
RBC: 2.8 MIL/uL — ABNORMAL LOW (ref 3.87–5.11)
RDW: 14.6 % (ref 11.5–15.5)
WBC: 13.7 10*3/uL — ABNORMAL HIGH (ref 4.0–10.5)

## 2017-06-03 LAB — BASIC METABOLIC PANEL
Anion gap: 10 (ref 5–15)
BUN: 75 mg/dL — ABNORMAL HIGH (ref 6–20)
CO2: 22 mmol/L (ref 22–32)
Calcium: 8.3 mg/dL — ABNORMAL LOW (ref 8.9–10.3)
Chloride: 103 mmol/L (ref 101–111)
Creatinine, Ser: 2.19 mg/dL — ABNORMAL HIGH (ref 0.44–1.00)
GFR calc Af Amer: 27 mL/min — ABNORMAL LOW (ref >60)
GFR calc non Af Amer: 23 mL/min — ABNORMAL LOW (ref >60)
Glucose, Bld: 293 mg/dL — ABNORMAL HIGH (ref 65–99)
Potassium: 4.8 mmol/L (ref 3.5–5.1)
Sodium: 135 mmol/L (ref 135–145)

## 2017-06-03 LAB — HEMOGLOBIN AND HEMATOCRIT, BLOOD
HCT: 22.4 % — ABNORMAL LOW (ref 36.0–46.0)
Hemoglobin: 7.4 g/dL — ABNORMAL LOW (ref 12.0–15.0)

## 2017-06-03 LAB — GLUCOSE, CAPILLARY
Glucose-Capillary: 300 mg/dL — ABNORMAL HIGH (ref 65–99)
Glucose-Capillary: 297 mg/dL — ABNORMAL HIGH (ref 65–99)
Glucose-Capillary: 202 mg/dL — ABNORMAL HIGH (ref 65–99)
Glucose-Capillary: 188 mg/dL — ABNORMAL HIGH (ref 65–99)

## 2017-06-03 LAB — PREPARE RBC (CROSSMATCH)

## 2017-06-03 LAB — ABO/RH: ABO/RH(D): A POS

## 2017-06-03 MED ORDER — INSULIN GLARGINE 100 UNIT/ML ~~LOC~~ SOLN
35.0000 [IU] | Freq: Two times a day (BID) | SUBCUTANEOUS | Status: DC
  Administered 2017-06-03 – 2017-06-05 (×5): 35 [IU] via SUBCUTANEOUS
  Filled 2017-06-03 (×5): qty 0.35

## 2017-06-03 MED ORDER — INSULIN ASPART 100 UNIT/ML ~~LOC~~ SOLN
6.0000 [IU] | Freq: Three times a day (TID) | SUBCUTANEOUS | Status: DC
  Administered 2017-06-03 – 2017-06-05 (×9): 6 [IU] via SUBCUTANEOUS

## 2017-06-03 MED ORDER — OXYCODONE HCL 5 MG PO TABS
5.0000 mg | ORAL_TABLET | ORAL | Status: DC | PRN
  Administered 2017-06-03 – 2017-06-06 (×10): 5 mg via ORAL
  Filled 2017-06-03 (×11): qty 1

## 2017-06-03 MED ORDER — SODIUM CHLORIDE 0.9 % IV SOLN
Freq: Once | INTRAVENOUS | Status: AC
  Administered 2017-06-03: 03:00:00 via INTRAVENOUS

## 2017-06-03 MED ORDER — VANCOMYCIN HCL IN DEXTROSE 750-5 MG/150ML-% IV SOLN
750.0000 mg | INTRAVENOUS | Status: DC
  Administered 2017-06-04 – 2017-06-05 (×2): 750 mg via INTRAVENOUS
  Filled 2017-06-03 (×2): qty 150

## 2017-06-03 NOTE — Progress Notes (Signed)
Patient called RN in room to discuss the blood transfusion process. Patient had several questions regarding this. After conversation, patient agreeable to receiving 1 unit of PRBCs. Night coverage paged with request.

## 2017-06-03 NOTE — Progress Notes (Signed)
[facility] PROGRESS NOTE    Progress Note  [name]  ZOX:[id number] DOB: [birth year] DOA: 06/01/2017 PCP: Arnoldo Morale, MD     Brief Narrative:   ANJELIQUE MAKAR is an 61 y.o. female past medical history of poorly controlled diabetes mellitus, coronary artery sees, chronic combined heart failure, recently discharged from the hospital on 05/24/2017 after and I indeed of the left thigh was discharged home on oral antibiotics which she completed treatment comes in for left thigh pain, CT scan of the thigh show fairly large amount of subcutaneous fluid collection the ED perform I and D and send cultures, she was started empirically on IV vancomycin and Rocephin  Assessment/Plan:   Abscess of left thigh: CT scan of the left thigh show no necrotizing fasciitis. The abscess was I & D in the emergency room, culture data pending. She was started empirically on IV vancomycin and Rocephin. appreciate General surgery assistance. Card agreed with stopping brillinta. She remained afebrile  Acute metabolic encephalopathy: Unlikely due to hyponatremia, likely due to the combination of narcotics, lyrica and gabapentin. We have decrease her narcotics. CBC showed a hemoglobin of 8.2 s/p 1 unit of PRBC.  Hyponatremia: Resolved with NS infusion. Likely due to hypovolemia.  Diabetes mellitus type 2, uncontrolled, with complications (HCC) Increase long acting insulin CBG cont to be high  Chronic kidney disease stage IV: Baseline creatinine is 2-3.  Chronic combined systolic and diastolic heart failure: Lasix was held on admission.  Coronary artery disease: She denies any chest pain, will hold aspirin and Berllintaa.  Essential hypertension: Hold antihypertensive medications except Coreg.    Obesity Counseling.  Acute blood loss anemia: Likely due to oozing from her incision, hemoglobin went from 9.3-7.4 will transfuse 1 unit of packed red blood cells. hbg now 8.2   DVT  prophylaxis: Scd Family Communication:none Disposition Plan/Barrier to D/C: unable to determine Code Status:     Code Status Orders  (From admission, onward)        Start     Ordered   06/02/17 0235  Full code  Continuous     06/02/17 0234    Code Status History    Date Active Date Inactive Code Status Order ID Comments User Context   04/28/2017 05:16 05/02/2017 16:24 Full Code [id number]  Rise Patience, MD ED   03/17/2017 12:22 03/19/2017 20:30 Full Code [id number]  Liliane Shi, PA-C ED   12/13/2016 03:45 12/14/2016 16:47 Full Code [id number]  Reubin Milan, MD Inpatient   09/13/2016 11:49 09/19/2016 14:58 Full Code [id number]  Flossie Dibble, MD ED   09/04/2016 01:37 09/05/2016 18:01 Full Code [id number]  Etta Quill, DO ED   08/21/2016 18:46 08/22/2016 17:33 Full Code [id number]  Thurnell Lose, MD Inpatient   03/18/2015 09:39 03/19/2015 18:02 Full Code [id number]  Dellia Nims, MD Inpatient   02/15/2015 17:00 02/18/2015 18:17 Full Code [id number]  Francesca Oman, DO Inpatient   10/02/2014 15:24 10/04/2014 16:49 Full Code [id number]  Samella Parr, NP Inpatient   03/25/2012 05:31 04/03/2012 19:31 Full Code [id number]  Maudry Diego, RN Inpatient        IV Access:    Peripheral IV   Procedures and diagnostic studies:   Ct Femur Left Wo Contrast  Result Date: 06/01/2017 CLINICAL DATA:  Left thigh swelling and pain. EXAM: CT OF THE LOWER LEFT EXTREMITY WITHOUT CONTRAST TECHNIQUE: Multidetector CT imaging of the lower left extremity was performed according to the standard protocol. COMPARISON:  None.  FINDINGS: Bones/Joint/Cartilage Negative for acute fracture or bone destruction. Osteoarthritis of the left hip and knee with joint space narrowing. Spurring of the femoral condyles consistent with osteoarthritis. No suspicious osseous lesions. Ligaments Suboptimally assessed by CT. Muscles and Tendons Subcutaneous edema and scant tracking fluid overlying the anterior and medial  compartmental muscles of the thigh. Soft tissues There is an approximately 7.4 x 6.6 x 2.5 cm subcutaneous abscess along the medial aspect of the mid to distal left thigh with adjacent subcutaneous soft tissue fatty induration consistent with cellulitis. This collection is seen at least 19-20 cm from the inguinal crease. Two small lipomas involving the medial compartment muscles are noted, the largest approximately 2 cm. IMPRESSION: 1. Within the medial aspect of the thigh approximately 20 cm caudad from the left inguinal crease is a subcutaneous abscess measuring 7.4 x 6.6 x 2.5 cm. There is associated skin thickening and subcutaneous fatty induration consistent with cellulitis. 2. No acute osseous appearing abnormality. There is osteoarthritis of the left knee joint and mild degenerative joint space narrowing of the left hip. Electronically Signed   By: Ashley Royalty M.D.   On: 06/01/2017 21:41     Medical Consultants:    None.  Anti-Infectives:   IV vancomycin and Rocephin  Subjective:    Josilynn M Dyal awake alert she relates her pain is bearable.  Objective:    Vitals:   06/02/17 2152 06/03/17 0215 06/03/17 0300 06/03/17 0510  BP: (!) 141/67 (!) 148/59 (!) 138/58 (!) 135/50  Pulse: 80 76 78 73  Resp: 18 17 17 16   Temp: 98.8 F (37.1 C) 98 F (36.7 C) 98.3 F (36.8 C) 98.3 F (36.8 C)  TempSrc: Oral Oral Oral Oral  SpO2: 99% 98% 96% 97%  Weight:      Height:        Intake/Output Summary (Last 24 hours) at 06/03/2017 1151 Last data filed at 06/03/2017 0800 Gross per 24 hour  Intake 1299.17 ml  Output 800 ml  Net 499.17 ml   Filed Weights   06/01/17 1904  Weight: 132.5 kg (292 lb)    Exam: General exam: In no acute distress. Respiratory system: Good air movement and clear to auscultation. Cardiovascular system: S1 & S2 heard, RRR. Gastrointestinal system: Abdomen is nondistended, soft and nontender.  Central nervous system: She is only answer simple yes and no  questions she's not alert or oriented to time place or person nonfocal deficits Extremities: No pedal edema. Skin: No rashes, lesions or ulcers Psychiatry: Confused   Data Reviewed:    Labs: Basic Metabolic Panel: Recent Labs  Lab 06/01/17 2100 06/03/17 0615  NA 129* 135  K 5.1 4.8  CL 95* 103  CO2 22 22  GLUCOSE 423* 293*  BUN 82* 75*  CREATININE 2.56* 2.19*  CALCIUM 8.3* 8.3*   GFR Estimated Creatinine Clearance: 39.9 mL/min (A) (by C-G formula based on SCr of 2.19 mg/dL (H)). Liver Function Tests: No results for input(s): AST, ALT, ALKPHOS, BILITOT, PROT, ALBUMIN in the last 168 hours. No results for input(s): LIPASE, AMYLASE in the last 168 hours. No results for input(s): AMMONIA in the last 168 hours. Coagulation profile No results for input(s): INR, PROTIME in the last 168 hours.  CBC: Recent Labs  Lab 06/01/17 2100 06/02/17 1126 06/03/17 0051 06/03/17 0615  WBC 15.1* 12.4*  --  13.7*  NEUTROABS 11.6*  --   --   --   HGB 9.3* 7.4* 7.4* 8.2*  HCT 27.7* 22.1* 22.4* 24.7*  MCV 88.2 88.4  --  88.2  PLT 304 288  --  272   Cardiac Enzymes: No results for input(s): CKTOTAL, CKMB, CKMBINDEX, TROPONINI in the last 168 hours. BNP (last 3 results) Recent Labs    11/10/16 1418  PROBNP 202   CBG: Recent Labs  Lab 06/02/17 1214 06/02/17 1718 06/02/17 2155 06/03/17 0739 06/03/17 1137  GLUCAP 395* 271* 249* 300* 297*   D-Dimer: No results for input(s): DDIMER in the last 72 hours. Hgb A1c: No results for input(s): HGBA1C in the last 72 hours. Lipid Profile: No results for input(s): CHOL, HDL, LDLCALC, TRIG, CHOLHDL, LDLDIRECT in the last 72 hours. Thyroid function studies: No results for input(s): TSH, T4TOTAL, T3FREE, THYROIDAB in the last 72 hours.  Invalid input(s): FREET3 Anemia work up: No results for input(s): VITAMINB12, FOLATE, FERRITIN, TIBC, IRON, RETICCTPCT in the last 72 hours. Sepsis Labs: Recent Labs  Lab 06/01/17 2100 06/01/17 2118  06/02/17 0034 06/02/17 1126 06/03/17 0615  WBC 15.1*  --   --  12.4* 13.7*  LATICACIDVEN  --  1.11 0.47*  --   --    Microbiology Recent Results (from the past 240 hour(s))  Wound or Superficial Culture     Status: None (Preliminary result)   Collection Time: 06/02/17 12:20 AM  Result Value Ref Range Status   Specimen Description ABSCESS LEFT THIGH  Final   Special Requests NONE  Final   Gram Stain   Final    FEW WBC PRESENT,BOTH PMN AND MONONUCLEAR MODERATE GRAM POSITIVE COCCI IN CHAINS MODERATE GRAM NEGATIVE RODS    Culture   Final    CULTURE REINCUBATED FOR BETTER GROWTH Performed at [facility], [place] [address] [place], [place] [address]    Report Status PENDING  Incomplete     Medications:   . allopurinol  100 mg Oral Daily  . amLODipine  10 mg Oral Daily  . aspirin  81 mg Oral Daily  . atorvastatin  80 mg Oral q1800  . carvedilol  6.25 mg Oral BID WC  . furosemide  40 mg Oral Daily  . gabapentin  200 mg Oral TID  . insulin aspart  0-20 Units Subcutaneous TID WC  . insulin aspart  0-5 Units Subcutaneous QHS  . insulin aspart  6 Units Subcutaneous TID WC  . insulin glargine  35 Units Subcutaneous BID  . isosorbide mononitrate  60 mg Oral Daily  . mometasone-formoterol  2 puff Inhalation BID  . pantoprazole  40 mg Oral Daily  . thrombin recombinant  20,000 Units Topical Once   Continuous Infusions: . sodium chloride 10 mL/hr at 06/02/17 2005  . sodium chloride    . cefTRIAXone (ROCEPHIN)  IV Stopped (06/02/17 1320)  . vancomycin        LOS: 1 day   Charlynne Cousins  [facility] Pager [phone]  *Please refer to [email or web address], password [email or web address] to get updated schedule on who will round on this patient, as hospitalists switch teams weekly. If 7PM-7AM, please contact night-coverage at [email or web address], password [email or web address] for any overnight needs.  06/03/2017, 11:51 AM

## 2017-06-03 NOTE — Progress Notes (Signed)
Patient received 1 unit of PRBCs Tolerated well. VSS.. CBC ordered. Will continue to monitor patient.

## 2017-06-03 NOTE — Progress Notes (Signed)
Pharmacy Antibiotic Note  [name] is a 61 y.o. female presented to the ED on 06/01/2017 with c/o left thigh pain from abscess/cellulitis -- s/p I&D in the ED.  Vancomycin and ceftriaxone started on admission for infection.  Today, 06/03/2017: - day #2 abx - afeb, wbc up 13.7 - scr down 2.19 (crcl~32 N)   Plan: - will adjust vancomycin dose to 750 mg IV q24h for est AUC 457 (goal 400-500)- next dose due on 11/23 at 2300 - continue ceftriaxone 2gm IV q24h per MD - monitor renal function - f/u cultures _______________________________  Height: 5\' 8"  (172.7 cm) Weight: 292 lb (132.5 kg) IBW/kg (Calculated) : 63.9  Temp (24hrs), Avg:98.3 F (36.8 C), Min:98 F (36.7 C), Max:98.8 F (37.1 C)  Recent Labs  Lab 06/01/17 2100 06/01/17 2118 06/02/17 0034 06/02/17 1126 06/03/17 0615  WBC 15.1*  --   --  12.4* 13.7*  CREATININE 2.56*  --   --   --  2.19*  LATICACIDVEN  --  1.11 0.47*  --   --     Estimated Creatinine Clearance: 39.9 mL/min (A) (by C-G formula based on SCr of 2.19 mg/dL (H)).    Allergies  Allergen Reactions  . Shellfish Allergy Anaphylaxis and Swelling  . Diazepam Other (See Comments)    "felt like out of body experience"  . Morphine And Related Itching   Antimicrobials this admission:  Vancomycin 06/02/2017 >> Ceftriaxone 06/02/2017 >>  Dose adjustments this admission:  11.21 CTX 1 gm >> 2gm 2nd obesity  Microbiology results:  11/21 L thigh abscess>> mod GP cocc in chains, mod GNRs   Thank you for allowing pharmacy to be a part of this patient's care.  Lynelle Doctor 06/03/2017 1:13 PM

## 2017-06-04 LAB — TYPE AND SCREEN
ABO/RH(D): A POS
Antibody Screen: NEGATIVE
Unit division: 0

## 2017-06-04 LAB — GLUCOSE, CAPILLARY
Glucose-Capillary: 143 mg/dL — ABNORMAL HIGH (ref 65–99)
Glucose-Capillary: 177 mg/dL — ABNORMAL HIGH (ref 65–99)
Glucose-Capillary: 141 mg/dL — ABNORMAL HIGH (ref 65–99)
Glucose-Capillary: 180 mg/dL — ABNORMAL HIGH (ref 65–99)

## 2017-06-04 LAB — BPAM RBC
Blood Product Expiration Date: 201812102359
ISSUE DATE / TIME: 201811220219
Unit Type and Rh: 6200

## 2017-06-04 NOTE — Progress Notes (Signed)
[facility] called 4 times with no response, hospitalist called and ordered was to take down Left thigh dressing and re-dress. Dressing was removed and a moderate amount of purulent serosanguineous drainage came out from wound. Once drainage stopped wound was cleaned, packed with iodoform with gauze and abd pad to cover with ace wrap to keep in place.

## 2017-06-04 NOTE — Progress Notes (Addendum)
[facility] PROGRESS NOTE    Progress Note  [name]  GLO:[id number] DOB: [birth year] DOA: 06/01/2017 PCP: Arnoldo Morale, MD     Brief Narrative:   [name] is an 61 y.o. female past medical history of poorly controlled diabetes mellitus, coronary artery sees, chronic combined heart failure, recently discharged from the hospital on 05/24/2017 after and I indeed of the left thigh was discharged home on oral antibiotics which she completed treatment comes in for left thigh pain, CT scan of the thigh show fairly large amount of subcutaneous fluid collection the ED perform I and D and send cultures, she was started empirically on IV vancomycin and Rocephin  Assessment/Plan:   Abscess of left thigh: CT scan of the left thigh show no necrotizing fasciitis. The abscess was I & D in the emergency room, culture data negative She was started empirically on IV vancomycin and Rocephin, to need empiric antibiotics for an additional 24 hours. Has remained afebrile, awaiting surgery to change dressing.  Acute metabolic encephalopathy: Unlikely due to hyponatremia, likely due to the combination of narcotics, lyrica and gabapentin. We have decrease her narcotics. Hemoglobin has remained stable.  Hyponatremia: Resolved with NS infusion. Likely due to hypovolemia.  Diabetes mellitus type 2, uncontrolled, with complications (HCC) Increase long acting insulin CBG cont to be high  Chronic kidney disease stage IV: Baseline creatinine is 2-3.  Chronic combined systolic and diastolic heart failure: Lasix was held on admission.  Coronary artery disease: She denies any chest pain, will hold aspirin and Berllintaa.  Essential hypertension: Hold antihypertensive medications except Coreg.    Obesity Counseling.  Acute blood loss anemia: Likely due to oozing from her incision in the setting of Brilinta, hemoglobin went from 9.3-7.4 will transfuse 1 unit of packed red blood  cells. Globin is stable.   DVT prophylaxis: Scd Family Communication:none Disposition Plan/Barrier to D/C: unable to determine Code Status:     Code Status Orders  (From admission, onward)        Start     Ordered   06/02/17 0235  Full code  Continuous     06/02/17 0234    Code Status History    Date Active Date Inactive Code Status Order ID Comments User Context   04/28/2017 05:16 05/02/2017 16:24 Full Code [id number]  Rise Patience, MD ED   03/17/2017 12:22 03/19/2017 20:30 Full Code [id number]  Liliane Shi, PA-C ED   12/13/2016 03:45 12/14/2016 16:47 Full Code [id number]  Reubin Milan, MD Inpatient   09/13/2016 11:49 09/19/2016 14:58 Full Code [id number]  Flossie Dibble, MD ED   09/04/2016 01:37 09/05/2016 18:01 Full Code [id number]  Etta Quill, DO ED   08/21/2016 18:46 08/22/2016 17:33 Full Code [id number]  Thurnell Lose, MD Inpatient   03/18/2015 09:39 03/19/2015 18:02 Full Code [id number]  Dellia Nims, MD Inpatient   02/15/2015 17:00 02/18/2015 18:17 Full Code [id number]  Francesca Oman, DO Inpatient   10/02/2014 15:24 10/04/2014 16:49 Full Code [id number]  Samella Parr, NP Inpatient   03/25/2012 05:31 04/03/2012 19:31 Full Code [id number]  Maudry Diego, RN Inpatient        IV Access:    Peripheral IV   Procedures and diagnostic studies:   No results found.   Medical Consultants:    None.  Anti-Infectives:   IV vancomycin and Rocephin  Subjective:    [name] awake alert she relates her pain is bearable.  Objective:    Vitals:  06/03/17 2130 06/04/17 0538 06/04/17 0837 06/04/17 0919  BP: (!) 140/55 (!) 150/59  (!) 152/68  Pulse: 74 77    Resp: 18 17    Temp: 98.3 F (36.8 C) 98.6 F (37 C)    TempSrc: Oral Oral    SpO2: 96% 93% 97%   Weight:      Height:        Intake/Output Summary (Last 24 hours) at 06/04/2017 0925 Last data filed at 06/04/2017 0750 Gross per 24 hour  Intake 1330 ml  Output 600 ml  Net 730 ml   Filed  Weights   06/01/17 1904  Weight: 132.5 kg (292 lb)    Exam: General exam: In no acute distress. Respiratory system: Good air movement and clear to auscultation. Cardiovascular system: S1 & S2 heard, RRR. Gastrointestinal system: Abdomen is nondistended, soft and nontender.  Central nervous system: She is only answer simple yes and no questions she's not alert or oriented to time place or person nonfocal deficits Extremities: No pedal edema.  Dressing on left thigh Skin: No rashes, lesions or ulcers Psychiatry: Confused   Data Reviewed:    Labs: Basic Metabolic Panel: Recent Labs  Lab 06/01/17 2100 06/03/17 0615  NA 129* 135  K 5.1 4.8  CL 95* 103  CO2 22 22  GLUCOSE 423* 293*  BUN 82* 75*  CREATININE 2.56* 2.19*  CALCIUM 8.3* 8.3*   GFR Estimated Creatinine Clearance: 39.9 mL/min (A) (by C-G formula based on SCr of 2.19 mg/dL (H)). Liver Function Tests: No results for input(s): AST, ALT, ALKPHOS, BILITOT, PROT, ALBUMIN in the last 168 hours. No results for input(s): LIPASE, AMYLASE in the last 168 hours. No results for input(s): AMMONIA in the last 168 hours. Coagulation profile No results for input(s): INR, PROTIME in the last 168 hours.  CBC: Recent Labs  Lab 06/01/17 2100 06/02/17 1126 06/03/17 0051 06/03/17 0615  WBC 15.1* 12.4*  --  13.7*  NEUTROABS 11.6*  --   --   --   HGB 9.3* 7.4* 7.4* 8.2*  HCT 27.7* 22.1* 22.4* 24.7*  MCV 88.2 88.4  --  88.2  PLT 304 288  --  272   Cardiac Enzymes: No results for input(s): CKTOTAL, CKMB, CKMBINDEX, TROPONINI in the last 168 hours. BNP (last 3 results) Recent Labs    11/10/16 1418  PROBNP 202   CBG: Recent Labs  Lab 06/03/17 0739 06/03/17 1137 06/03/17 1712 06/03/17 2204 06/04/17 0741  GLUCAP 300* 297* 202* 188* 143*   D-Dimer: No results for input(s): DDIMER in the last 72 hours. Hgb A1c: No results for input(s): HGBA1C in the last 72 hours. Lipid Profile: No results for input(s): CHOL, HDL,  LDLCALC, TRIG, CHOLHDL, LDLDIRECT in the last 72 hours. Thyroid function studies: No results for input(s): TSH, T4TOTAL, T3FREE, THYROIDAB in the last 72 hours.  Invalid input(s): FREET3 Anemia work up: No results for input(s): VITAMINB12, FOLATE, FERRITIN, TIBC, IRON, RETICCTPCT in the last 72 hours. Sepsis Labs: Recent Labs  Lab 06/01/17 2100 06/01/17 2118 06/02/17 0034 06/02/17 1126 06/03/17 0615  WBC 15.1*  --   --  12.4* 13.7*  LATICACIDVEN  --  1.11 0.47*  --   --    Microbiology Recent Results (from the past 240 hour(s))  Wound or Superficial Culture     Status: None (Preliminary result)   Collection Time: 06/02/17 12:20 AM  Result Value Ref Range Status   Specimen Description ABSCESS LEFT THIGH  Final   Special Requests NONE  Final   Gram Stain   Final    FEW WBC PRESENT,BOTH PMN AND MONONUCLEAR MODERATE GRAM POSITIVE COCCI IN CHAINS MODERATE GRAM NEGATIVE RODS    Culture   Final    CULTURE REINCUBATED FOR BETTER GROWTH Performed at [facility], [place] [address] [place], [place] [address]    Report Status PENDING  Incomplete     Medications:   . allopurinol  100 mg Oral Daily  . amLODipine  10 mg Oral Daily  . aspirin  81 mg Oral Daily  . atorvastatin  80 mg Oral q1800  . carvedilol  6.25 mg Oral BID WC  . furosemide  40 mg Oral Daily  . gabapentin  200 mg Oral TID  . insulin aspart  0-20 Units Subcutaneous TID WC  . insulin aspart  0-5 Units Subcutaneous QHS  . insulin aspart  6 Units Subcutaneous TID WC  . insulin glargine  35 Units Subcutaneous BID  . isosorbide mononitrate  60 mg Oral Daily  . mometasone-formoterol  2 puff Inhalation BID  . pantoprazole  40 mg Oral Daily  . thrombin recombinant  20,000 Units Topical Once   Continuous Infusions: . sodium chloride 10 mL/hr at 06/02/17 2005  . sodium chloride    . cefTRIAXone (ROCEPHIN)  IV Stopped (06/03/17 1224)  . vancomycin        LOS: 2 days   Charlynne Cousins  [facility] Pager [phone]  *Please refer to [email or web address], password [email or web address] to get updated schedule on who will round on this patient, as hospitalists switch teams weekly. If 7PM-7AM, please contact night-coverage at [email or web address], password [email or web address] for any overnight needs.  06/04/2017, 9:25 AM

## 2017-06-05 ENCOUNTER — Inpatient Hospital Stay (HOSPITAL_COMMUNITY): Payer: Medicaid Other

## 2017-06-05 LAB — GLUCOSE, CAPILLARY
Glucose-Capillary: 168 mg/dL — ABNORMAL HIGH (ref 65–99)
Glucose-Capillary: 168 mg/dL — ABNORMAL HIGH (ref 65–99)
Glucose-Capillary: 171 mg/dL — ABNORMAL HIGH (ref 65–99)
Glucose-Capillary: 171 mg/dL — ABNORMAL HIGH (ref 65–99)

## 2017-06-05 LAB — CREATININE, SERUM
Creatinine, Ser: 2.22 mg/dL — ABNORMAL HIGH (ref 0.44–1.00)
GFR calc Af Amer: 27 mL/min — ABNORMAL LOW (ref >60)
GFR calc non Af Amer: 23 mL/min — ABNORMAL LOW (ref >60)

## 2017-06-05 IMAGING — CT CT CHEST W/O CM
2 of 3 series · 13 of 36 positions shown, 16 images · non-contrast
Comparison: Chest radiograph, 02/15/2015.

CLINICAL DATA: Dyspnea and chest pain since [REDACTED].Hx: CHF, DM,
HTN, Hep C, Cholecystectomy

EXAM:
CT CHEST WITHOUT CONTRAST
TECHNIQUE: Multidetector CT imaging of the chest was performed following the
standard protocol without IV contrast.

[Series 201: chest without, idose (2) · axial · non-contrast · 0.70mm/px · z∈[+129,+374]mm · 10 of 59 slices shown, 13 images]
[im 5/59  mediastinal]
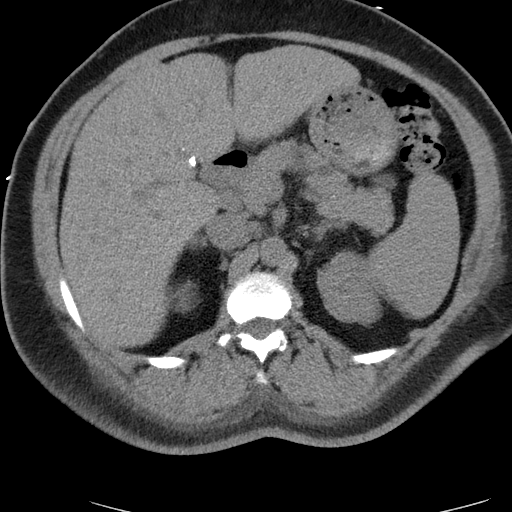
[im 5/59  lung]
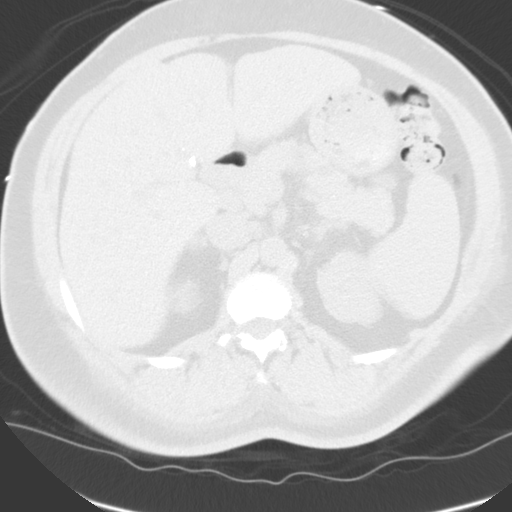
[im 9/59  lung]
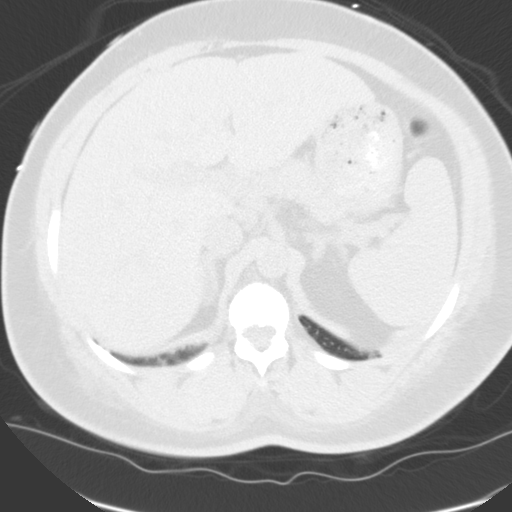
[im 16/59  lung]
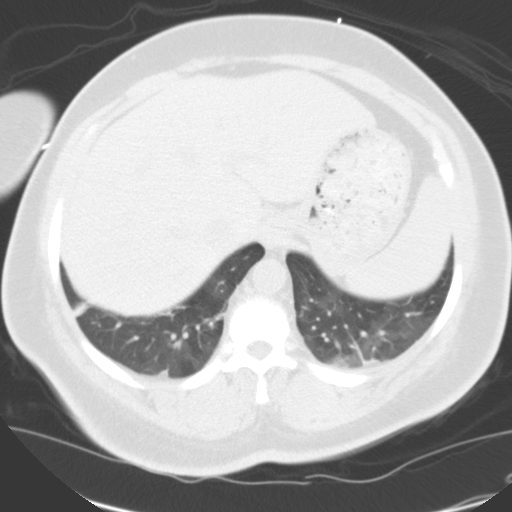
[im 22/59  lung]
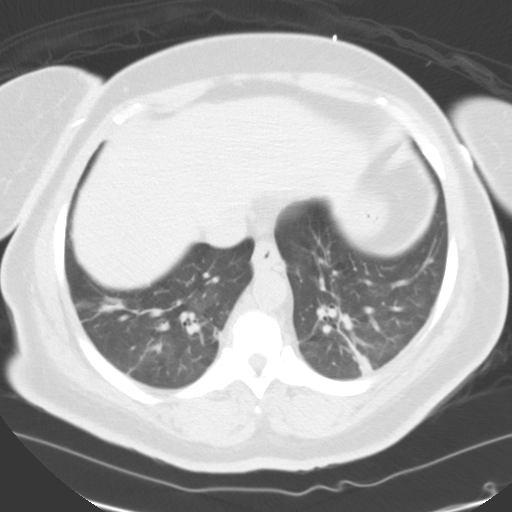
[im 26/59  mediastinal]
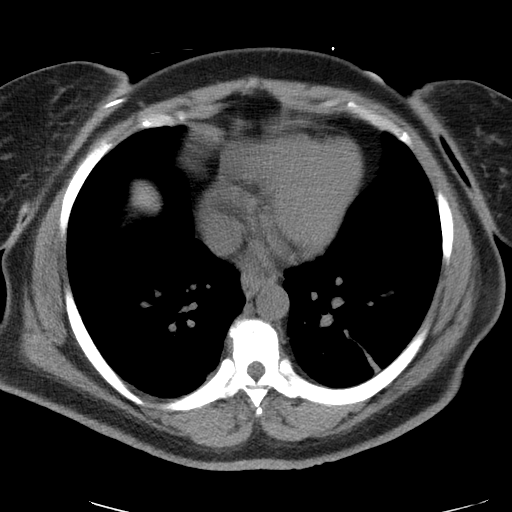
[im 26/59  lung]
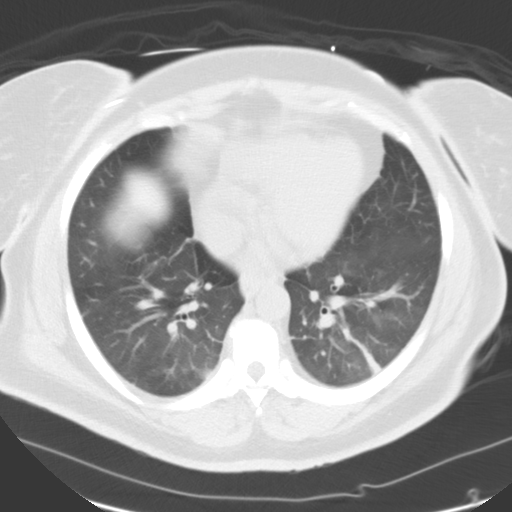
[im 33/59  lung]
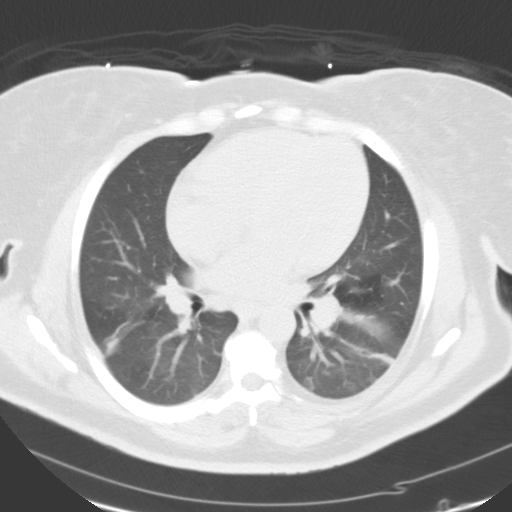
[im 37/59  lung]
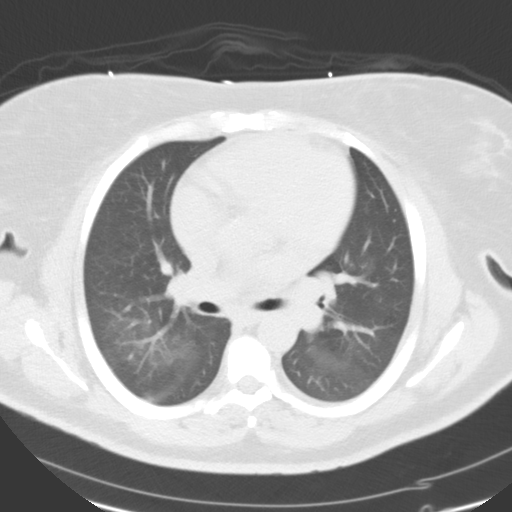
[im 43/59  lung]
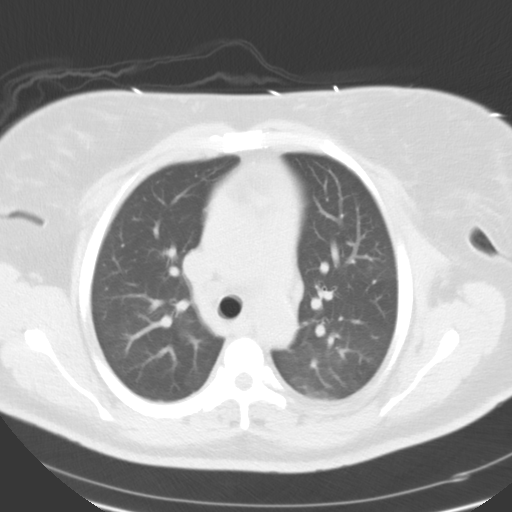
[im 50/59  mediastinal]
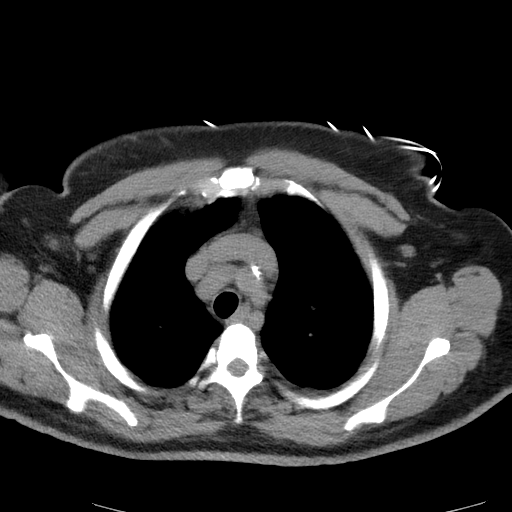
[im 50/59  lung]
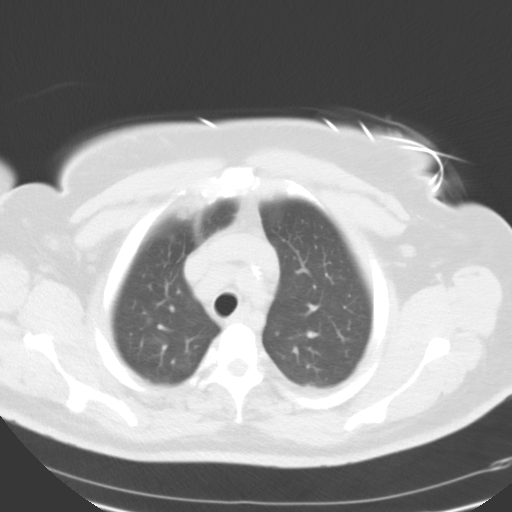
[im 54/59  lung]
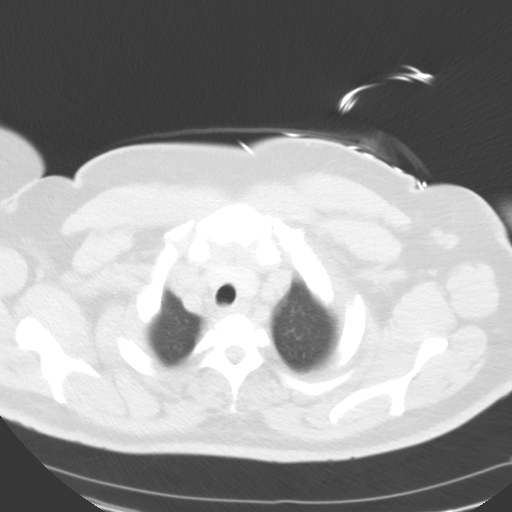

[Series 203: coronal, idose (2) · coronal · 0.45mm/px · 3 of 114 slices shown]
[im 23/114  lung]
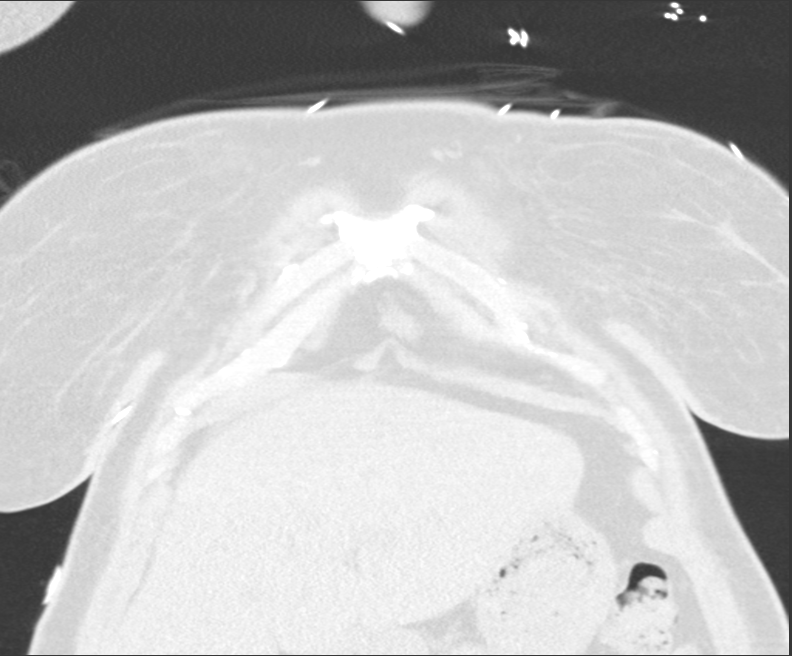
[im 46/114  lung]
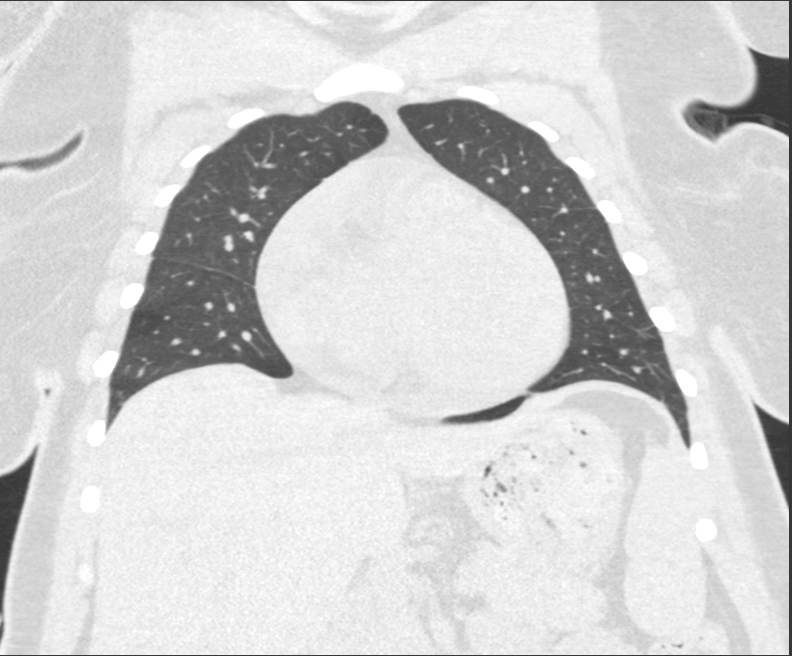
[im 68/114  lung]
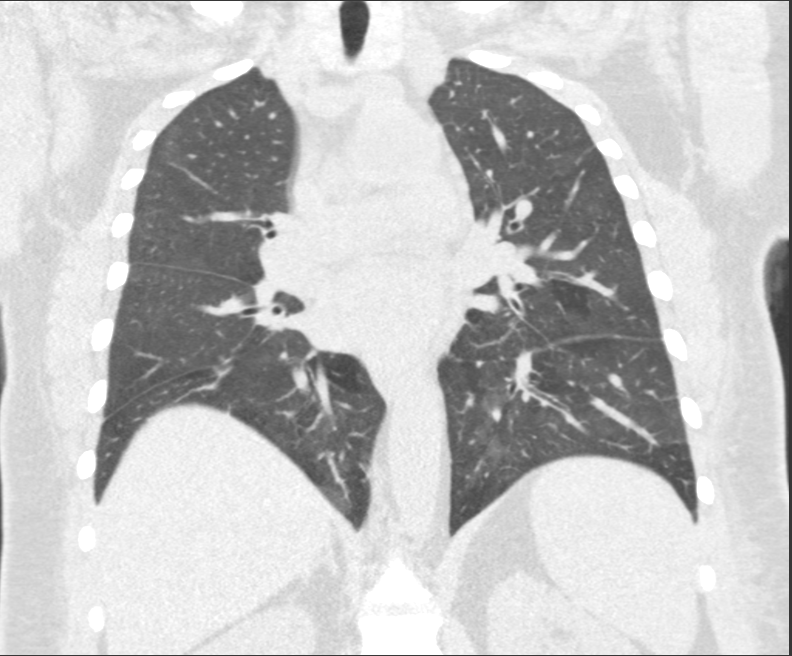

[13 of 36 positions shown; findings below may reference images not displayed]

FINDINGS: Thoracic inlet: No mass or adenopathy. Visualized thyroid is
unremarkable.

Mediastinum and hila: Heart mildly enlarged. Mild coronary artery
calcifications. Great vessels are normal in caliber. No mediastinal
or hilar masses or pathologically enlarged lymph nodes.

Lungs and pleura: Subtle areas of ground-glass opacity in the right
upper lobe, minimally in the left upper lobe and in both lower
lobes. There has linear and reticular opacity in both lower lobes
consistent with subsegmental atelectasis. No lung mass or suspicious
nodule. No pleural effusion. No pneumothorax.

Limited upper abdomen: Status post cholecystectomy. Otherwise
unremarkable.

Musculoskeletal: No discrete osteoblastic or osteolytic lesions.
Small probable hemangioma in the T7 vertebrae. Degenerative changes
noted along the mid to lower thoracic spine.
IMPRESSION: 1. There are subtle areas of hazy ground-glass opacity in a patchy
distribution most evident in the lower lobes and right upper lobe.
This is nonspecific. It may reflect multifocal areas of infection or
inflammation. There is no convincing pulmonary edema.
2. Additional linear and reticular opacities in the lower lobes
consistent with subsegmental atelectasis.

## 2017-06-05 MED ORDER — INSULIN GLARGINE 100 UNIT/ML ~~LOC~~ SOLN
40.0000 [IU] | Freq: Two times a day (BID) | SUBCUTANEOUS | Status: DC
  Administered 2017-06-05 – 2017-06-06 (×2): 40 [IU] via SUBCUTANEOUS
  Filled 2017-06-05 (×3): qty 0.4

## 2017-06-05 NOTE — Progress Notes (Signed)
IV team attempted to restart. Unable to restart with [place].  Another IV team member will attempt.

## 2017-06-05 NOTE — Progress Notes (Signed)
Paged [place] [phone] for consult.

## 2017-06-05 NOTE — Progress Notes (Signed)
Dressing changed per Dr. Olevia Bowens.  Pt to have wound care consult.

## 2017-06-05 NOTE — Progress Notes (Addendum)
[facility] PROGRESS NOTE    Progress Note  [name]  ZOX:[id number] DOB: [birth year] DOA: 06/01/2017 PCP: Arnoldo Morale, MD     Brief Narrative:   [name] is an 61 y.o. female past medical history of poorly controlled diabetes mellitus, coronary artery sees, chronic combined heart failure, recently discharged from the hospital on 05/24/2017 after and I indeed of the left thigh was discharged home on oral antibiotics which she completed treatment comes in for left thigh pain, CT scan of the thigh show fairly large amount of subcutaneous fluid collection the ED perform I and D and send cultures, she was started empirically on IV vancomycin and Rocephin  Assessment/Plan:   Abscess of left thigh: CT scan of the left thigh show no necrotizing fasciitis. The abscess was I & D in the emergency room, culture data negative Consult wound care, will continue IV vancomycin and Rocephin. Bandage change today, copious amount of purulent material draining. Will repeat Ct scan.  Acute metabolic encephalopathy: Unlikely due to hyponatremia, likely due to the combination of narcotics, lyrica and gabapentin. We have decrease her narcotics. Hemoglobin has remained stable.  Hyponatremia: Resolved with NS infusion. Likely due to hypovolemia.  Diabetes mellitus type 2, uncontrolled, with complications (HCC) Increase long acting insulin CBG cont to be high  Chronic kidney disease stage IV: Baseline creatinine is 2-3.  Chronic combined systolic and diastolic heart failure: Resume Lasix.  Continue Coreg.  Resume antihypertensive medication.  Coronary artery disease: She denies any chest pain, will hold aspirin and Berllintaa.  Essential hypertension: Hold antihypertensive medications except Coreg.    Obesity Counseling.  Acute blood loss anemia: Likely due to oozing from her incision in the setting of Brilinta, hemoglobin went from 9.3-7.4 will transfuse 1 unit of packed red  blood cells. Hbg is stable.   DVT prophylaxis: Scd Family Communication:none Disposition Plan/Barrier to D/C: unable to determine Code Status:     Code Status Orders  (From admission, onward)        Start     Ordered   06/02/17 0235  Full code  Continuous     06/02/17 0234    Code Status History    Date Active Date Inactive Code Status Order ID Comments User Context   04/28/2017 05:16 05/02/2017 16:24 Full Code [id number]  Rise Patience, MD ED   03/17/2017 12:22 03/19/2017 20:30 Full Code [id number]  Liliane Shi, PA-C ED   12/13/2016 03:45 12/14/2016 16:47 Full Code [id number]  Reubin Milan, MD Inpatient   09/13/2016 11:49 09/19/2016 14:58 Full Code [id number]  Flossie Dibble, MD ED   09/04/2016 01:37 09/05/2016 18:01 Full Code [id number]  Etta Quill, DO ED   08/21/2016 18:46 08/22/2016 17:33 Full Code [id number]  Thurnell Lose, MD Inpatient   03/18/2015 09:39 03/19/2015 18:02 Full Code [id number]  Dellia Nims, MD Inpatient   02/15/2015 17:00 02/18/2015 18:17 Full Code [id number]  Francesca Oman, DO Inpatient   10/02/2014 15:24 10/04/2014 16:49 Full Code [id number]  Samella Parr, NP Inpatient   03/25/2012 05:31 04/03/2012 19:31 Full Code [id number]  Maudry Diego, RN Inpatient        IV Access:    Peripheral IV   Procedures and diagnostic studies:   No results found.   Medical Consultants:    None.  Anti-Infectives:   IV vancomycin and Rocephin  Subjective:    Caroly M [name] no complains  Objective:    Vitals:   06/04/17 1928 06/04/17 2120 06/05/17  0459 06/05/17 0925  BP:  139/78 (!) 140/54   Pulse:  92 79   Resp:  18 18   Temp:  99.5 F (37.5 C) 98.9 F (37.2 C)   TempSrc:  Axillary Oral   SpO2: 96% 98% 95% 93%  Weight:      Height:        Intake/Output Summary (Last 24 hours) at 06/05/2017 1037 Last data filed at 06/05/2017 0644 Gross per 24 hour  Intake 1247.33 ml  Output 1900 ml  Net -652.67 ml   Filed Weights   06/01/17 1904    Weight: 132.5 kg (292 lb)    Exam: General exam: In no acute distress. Respiratory system: Good air movement and clear to auscultation. Cardiovascular system: S1 & S2 heard, RRR. Gastrointestinal system: Abdomen is nondistended, soft and nontender.  Central nervous system: She is only answer simple yes and no questions she's not alert or oriented to time place or person nonfocal deficits Extremities: No pedal edema.  Significant erythema and induration on the inner left thigh with purulent drainage. Skin: No rashes, lesions or ulcers Psychiatry: Confused   Data Reviewed:    Labs: Basic Metabolic Panel: Recent Labs  Lab 06/01/17 2100 06/03/17 0615 06/05/17 0552  NA 129* 135  --   K 5.1 4.8  --   CL 95* 103  --   CO2 22 22  --   GLUCOSE 423* 293*  --   BUN 82* 75*  --   CREATININE 2.56* 2.19* 2.22*  CALCIUM 8.3* 8.3*  --    GFR Estimated Creatinine Clearance: 39.3 mL/min (A) (by C-G formula based on SCr of 2.22 mg/dL (H)). Liver Function Tests: No results for input(s): AST, ALT, ALKPHOS, BILITOT, PROT, ALBUMIN in the last 168 hours. No results for input(s): LIPASE, AMYLASE in the last 168 hours. No results for input(s): AMMONIA in the last 168 hours. Coagulation profile No results for input(s): INR, PROTIME in the last 168 hours.  CBC: Recent Labs  Lab 06/01/17 2100 06/02/17 1126 06/03/17 0051 06/03/17 0615  WBC 15.1* 12.4*  --  13.7*  NEUTROABS 11.6*  --   --   --   HGB 9.3* 7.4* 7.4* 8.2*  HCT 27.7* 22.1* 22.4* 24.7*  MCV 88.2 88.4  --  88.2  PLT 304 288  --  272   Cardiac Enzymes: No results for input(s): CKTOTAL, CKMB, CKMBINDEX, TROPONINI in the last 168 hours. BNP (last 3 results) Recent Labs    11/10/16 1418  PROBNP 202   CBG: Recent Labs  Lab 06/04/17 0741 06/04/17 1158 06/04/17 1725 06/04/17 2118 06/05/17 0800  GLUCAP 143* 177* 141* 180* 168*   D-Dimer: No results for input(s): DDIMER in the last 72 hours. Hgb A1c: No results for  input(s): HGBA1C in the last 72 hours. Lipid Profile: No results for input(s): CHOL, HDL, LDLCALC, TRIG, CHOLHDL, LDLDIRECT in the last 72 hours. Thyroid function studies: No results for input(s): TSH, T4TOTAL, T3FREE, THYROIDAB in the last 72 hours.  Invalid input(s): FREET3 Anemia work up: No results for input(s): VITAMINB12, FOLATE, FERRITIN, TIBC, IRON, RETICCTPCT in the last 72 hours. Sepsis Labs: Recent Labs  Lab 06/01/17 2100 06/01/17 2118 06/02/17 0034 06/02/17 1126 06/03/17 0615  WBC 15.1*  --   --  12.4* 13.7*  LATICACIDVEN  --  1.11 0.47*  --   --    Microbiology Recent Results (from the past 240 hour(s))  Wound or Superficial Culture     Status: None (Preliminary result)  Collection Time: 06/02/17 12:20 AM  Result Value Ref Range Status   Specimen Description ABSCESS LEFT THIGH  Final   Special Requests NONE  Final   Gram Stain   Final    FEW WBC PRESENT,BOTH PMN AND MONONUCLEAR MODERATE GRAM POSITIVE COCCI IN CHAINS MODERATE GRAM NEGATIVE RODS    Culture   Final    CULTURE REINCUBATED FOR BETTER GROWTH Performed at [facility], [place] [address], [place], [place] [address]    Report Status PENDING  Incomplete     Medications:   . allopurinol  100 mg Oral Daily  . amLODipine  10 mg Oral Daily  . aspirin  81 mg Oral Daily  . atorvastatin  80 mg Oral q1800  . carvedilol  6.25 mg Oral BID WC  . furosemide  40 mg Oral Daily  . gabapentin  200 mg Oral TID  . insulin aspart  0-20 Units Subcutaneous TID WC  . insulin aspart  0-5 Units Subcutaneous QHS  . insulin aspart  6 Units Subcutaneous TID WC  . insulin glargine  35 Units Subcutaneous BID  . isosorbide mononitrate  60 mg Oral Daily  . mometasone-formoterol  2 puff Inhalation BID  . pantoprazole  40 mg Oral Daily  . thrombin recombinant  20,000 Units Topical Once   Continuous Infusions: . sodium chloride 10 mL/hr at 06/02/17 2005  . sodium chloride    . cefTRIAXone (ROCEPHIN)  IV Stopped  (06/04/17 1823)  . vancomycin Stopped (06/05/17 0027)      LOS: 3 days   Charlynne Cousins  [facility] Pager [phone]  *Please refer to [email or web address], password [email or web address] to get updated schedule on who will round on this patient, as hospitalists switch teams weekly. If 7PM-7AM, please contact night-coverage at [email or web address], password [email or web address] for any overnight needs.  06/05/2017, 10:37 AM

## 2017-06-05 NOTE — Progress Notes (Signed)
Pt has IV right wrist and right ac, wrist occluded, ac infiltrated.  Paged IV team pt very difficult stick.

## 2017-06-05 NOTE — Progress Notes (Signed)
Pt back from CT of left femur via bed.  Pt tolerated well.

## 2017-06-06 LAB — AEROBIC CULTURE  (SUPERFICIAL SPECIMEN)

## 2017-06-06 LAB — BASIC METABOLIC PANEL
Anion gap: 7 (ref 5–15)
BUN: 60 mg/dL — ABNORMAL HIGH (ref 6–20)
CO2: 24 mmol/L (ref 22–32)
Calcium: 8.4 mg/dL — ABNORMAL LOW (ref 8.9–10.3)
Chloride: 107 mmol/L (ref 101–111)
Creatinine, Ser: 2.16 mg/dL — ABNORMAL HIGH (ref 0.44–1.00)
GFR calc Af Amer: 28 mL/min — ABNORMAL LOW (ref >60)
GFR calc non Af Amer: 24 mL/min — ABNORMAL LOW (ref >60)
Glucose, Bld: 210 mg/dL — ABNORMAL HIGH (ref 65–99)
Potassium: 4.9 mmol/L (ref 3.5–5.1)
Sodium: 138 mmol/L (ref 135–145)

## 2017-06-06 LAB — CBC
HCT: 25.1 % — ABNORMAL LOW (ref 36.0–46.0)
Hemoglobin: 8.3 g/dL — ABNORMAL LOW (ref 12.0–15.0)
MCH: 29.3 pg (ref 26.0–34.0)
MCHC: 33.1 g/dL (ref 30.0–36.0)
MCV: 88.7 fL (ref 78.0–100.0)
Platelets: 290 10*3/uL (ref 150–400)
RBC: 2.83 MIL/uL — ABNORMAL LOW (ref 3.87–5.11)
RDW: 14.7 % (ref 11.5–15.5)
WBC: 11.1 10*3/uL — ABNORMAL HIGH (ref 4.0–10.5)

## 2017-06-06 LAB — AEROBIC CULTURE W GRAM STAIN (SUPERFICIAL SPECIMEN)

## 2017-06-06 LAB — GLUCOSE, CAPILLARY: Glucose-Capillary: 181 mg/dL — ABNORMAL HIGH (ref 65–99)

## 2017-06-06 MED ORDER — ASPIRIN EC 81 MG PO TBEC
81.0000 mg | DELAYED_RELEASE_TABLET | Freq: Every day | ORAL | Status: DC
  Administered 2017-06-06: 81 mg via ORAL
  Filled 2017-06-06: qty 1

## 2017-06-06 MED ORDER — TICAGRELOR 90 MG PO TABS
90.0000 mg | ORAL_TABLET | Freq: Two times a day (BID) | ORAL | Status: DC
  Administered 2017-06-06: 90 mg via ORAL
  Filled 2017-06-06: qty 1

## 2017-06-06 MED ORDER — SULFAMETHOXAZOLE-TRIMETHOPRIM 800-160 MG PO TABS: 1.0000 | ORAL_TABLET | Freq: Two times a day (BID) | ORAL | 0 refills | Status: DC

## 2017-06-06 MED ORDER — OXYCODONE-ACETAMINOPHEN 5-325 MG PO TABS: 1.0000 | ORAL_TABLET | Freq: Four times a day (QID) | ORAL | 0 refills | Status: DC | PRN

## 2017-06-06 NOTE — Consult Note (Signed)
[place] Nurse wound consult note Reason for Consult:I & D left leg abscess.  Discharging today.  Has supplies and wound care is provided.  Iodoform packing strip and dry dressing daily per MD orders.  Patient will have [place] for teaching and wound care.  Patient understands.  No further needs at this time.  Will not follow at this time.  Please re-consult if needed.  Domenic Moras RN BSN [place] Pager [phone]

## 2017-06-06 NOTE — Care Management Note (Signed)
Case Management Note  Patient Details  Name: [name] MRN: [id number] Date of Birth: March 02, 1956  Subjective/Objective:     Left leg abscess with cellulitis, acute encephalopathy, CHF               Action/Plan: Discharge Planning: NCM spoke to pt and offered choice for HH/list provided. Pt requested [facility] for [facility]. Contacted rep with new referral. Unit RN provided additional dressing changes, soc for [facility] is 24-48 hours. Pt requested RW and 3n1 bedside commode for home. Contacted [facility] to deliver to room prior to dc.   PCP Arnoldo Morale MD  Expected Discharge Date:  06/06/17               Expected Discharge Plan:  [place]  In-House Referral:  NA  Discharge planning Services  CM Consult  Post Acute Care Choice:  Home Health Choice offered to:  Patient  DME Arranged:  3-N-1, [place] rolling DME Agency:  [place]:  RN [facility] Agency:  [place]  Status of Service:  Completed, signed off  If discussed at [place] of Stay Meetings, dates discussed:    Additional Comments:  Erenest Rasher, RN 06/06/2017, 9:55 AM

## 2017-06-06 NOTE — Discharge Summary (Signed)
Physician Discharge Summary  [name] CMK:[id number] DOB: 1955/09/06 DOA: 06/01/2017  PCP: Arnoldo Morale, MD  Admit date: 06/01/2017 Discharge date: 06/06/2017  Admitted From: home Disposition:  Home  Recommendations for Outpatient Follow-up:  1. Follow up with Dr. Sherrie Sport in 1-2 weeks 2. Please obtain BMP/CBC in one week   Home Health:Yes Equipment/Devices:None  Discharge Condition:stable CODE STATUS:full  Diet recommendation:Carb Modified   Brief/Interim Summary: 61 y.o. female past medical history of poorly controlled diabetes mellitus, coronary artery sees, chronic combined heart failure, recently discharged from the hospital on 05/24/2017 after and I indeed of the left thigh was discharged home on oral antibiotics which she completed treatment comes in for left thigh pain, CT scan of the thigh show fairly large amount of subcutaneous fluid collection the ED perform I and D and send cultures.   Discharge Diagnoses:  Active Problems:   Diabetes mellitus type 2, uncontrolled, with complications (HCC)   Essential hypertension   Obesity   CKD (chronic kidney disease) stage 4, GFR 15-29 ml/min (HCC)   CAD (coronary artery disease)   Hyperlipidemia   Chronic gout due to renal impairment of multiple sites without tophus   Chronic combined systolic and diastolic CHF (congestive heart failure) (HCC)   Aortic stenosis   Abscess of left thigh   Acute metabolic encephalopathy  Left thigh abscess with cellulitis: A CT scan of the left thigh showed no necrotizing fasciitis, it was I indeed and cultures were sent from the ED she was started empirically on IV vancomycin and Zosyn. Due to the copious amount of bleeding general surgery was consulted and recommended to repack and apply pressure.  Cultures results show strep viridans. She remained afebrile she was changed to oral Bactrim which she will continue for a total of 10 days. She will follow-up with general surgery as an  outpatient in 1 week  Acute encephalopathy: Likely due to medications, narcotics, Lyrica and gabapentin. Her narcotics was decreased is resolved.  Hyponatremia: Likely due to hypovolemia resolved with IV fluid infusion.  Diabetes mellitus type 2: No changes were made to her medication.  Chronic kidney disease stage IV: Creatinine remained at baseline no changes were made.  Coronary artery disease: No changes were made to her medication. Her aspirin and Brilinta were held for 3 days cardiology was consulted which agreed with plan they recommended to continue it for at least a year.  Essential hypertension: All of her antihypertensive medications were held on admission due to the amount and copious bleeding she was having except for Coreg.  She will resume all of her antihypertensive medications an outpatient.  Obesity: Counseling was performed.  Acute blood loss anemia: After I&D she was bleeding out from the I&D, aspirin and Brilinta had to be held for 3 days, we applied pressure for over 30 minutes surgery was consulted which agreed with plan and her bleeding stopped. She was transfused 1 unit of packed red blood cells. After 3 days her aspirin and Plavix were resumed which she will continue as an outpatient.   Discharge Instructions  Discharge Instructions    Diet - low sodium heart healthy   Complete by:  As directed    Diet - low sodium heart healthy   Complete by:  As directed    Increase activity slowly   Complete by:  As directed    Increase activity slowly   Complete by:  As directed      Allergies as of 06/06/2017      Reactions  Shellfish Allergy Anaphylaxis, Swelling   Diazepam Other (See Comments)   "felt like out of body experience"   Morphine And Related Itching      Medication List    STOP taking these medications   doxycycline 100 MG capsule Commonly known as:  VIBRAMYCIN     TAKE these medications   ACCU-CHEK AVIVA device Use as  instructed daily.   accu-chek softclix lancets Use as instructed daily.   acetaminophen-codeine 300-30 MG tablet Commonly known as:  TYLENOL #3 Take 1 tablet by mouth every 12 (twelve) hours as needed for severe pain. For osteoarthritis (unable to take NSAIDs due to CKD)   albuterol 108 (90 Base) MCG/ACT inhaler Commonly known as:  VENTOLIN HFA Inhale 2 puffs into the lungs every 4 (four) hours as needed for wheezing or shortness of breath.   albuterol (2.5 MG/3ML) 0.083% nebulizer solution Commonly known as:  PROVENTIL Take 3 mLs (2.5 mg total) by nebulization every 6 (six) hours as needed for wheezing or shortness of breath.   allopurinol 100 MG tablet Commonly known as:  ZYLOPRIM Take 1 tablet (100 mg total) by mouth daily.   amLODipine 10 MG tablet Commonly known as:  NORVASC Take 1 tablet (10 mg total) by mouth daily.   aspirin 81 MG EC tablet Take 1 tablet (81 mg total) by mouth daily.   atorvastatin 80 MG tablet Commonly known as:  LIPITOR Take 1 tablet (80 mg total) by mouth daily at 6 PM.   carvedilol 25 MG tablet Commonly known as:  COREG TAKE 1 TABLET BY MOUTH 2 TIMES DAILY WITH A MEAL.   colchicine 0.6 MG tablet Take 0.5 tablets (0.3 mg total) by mouth daily. What changed:  how much to take   docusate sodium 100 MG capsule Commonly known as:  COLACE Take 1 capsule (100 mg total) every 12 (twelve) hours by mouth.   exenatide 10 MCG/0.04ML Sopn injection Commonly known as:  BYETTA 10 MCG PEN Inject 0.04 mLs (10 mcg total) into the skin 2 (two) times daily with a meal.   ferrous sulfate 325 (65 FE) MG tablet Take 1 tablet (325 mg total) by mouth 2 (two) times daily with a meal.   fluticasone 50 MCG/ACT nasal spray Commonly known as:  FLONASE Place 2 sprays into both nostrils daily as needed for allergies or rhinitis.   Fluticasone-Salmeterol 250-50 MCG/DOSE Aepb Commonly known as:  ADVAIR DISKUS Inhale 1 puff into the lungs 2 (two) times daily. What  changed:    when to take this  reasons to take this   furosemide 40 MG tablet Commonly known as:  LASIX Take 1 tablet (40 mg total) by mouth daily.   gabapentin 100 MG capsule Commonly known as:  NEURONTIN TAKE 2 CAPSULES BY MOUTH 3 TIMES A DAY. What changed:    how much to take  how to take this  when to take this  additional instructions   glucose blood test strip Commonly known as:  TRUE METRIX BLOOD GLUCOSE TEST Use as instructed   glucose blood test strip Commonly known as:  ACCU-CHEK AVIVA Use as instructed daily   hydrALAZINE 50 MG tablet Commonly known as:  APRESOLINE Take 1 tablet (50 mg total) by mouth 3 (three) times daily.   HYDROcodone-acetaminophen 5-325 MG tablet Commonly known as:  NORCO/VICODIN Take 1 tablet every 4 (four) hours as needed by mouth for moderate pain.   hydrOXYzine 50 MG tablet Commonly known as:  ATARAX/VISTARIL TAKE 1 TABLET (50 MG TOTAL)  BY MOUTH 3 (THREE) TIMES DAILY AS NEEDED. What changed:  reasons to take this   insulin aspart 100 UNIT/ML injection Commonly known as:  novoLOG 0-12 units subcutaneously 3 times daily before meals according to sliding scale   Insulin Glargine 100 UNIT/ML Solostar Pen Commonly known as:  LANTUS SOLOSTAR Inject 40 Units into the skin 2 (two) times daily. What changed:  how much to take   isosorbide mononitrate 60 MG 24 hr tablet Commonly known as:  IMDUR Take 1 tablet (60 mg total) by mouth daily.   ketotifen 0.025 % ophthalmic solution Commonly known as:  ZADITOR Place 1 drop into both eyes daily as needed (for dry eyes).   nitroGLYCERIN 0.4 MG SL tablet Commonly known as:  NITROSTAT Place 1 tablet (0.4 mg total) under the tongue every 5 (five) minutes x 3 doses as needed for chest pain.   omeprazole 20 MG capsule Commonly known as:  PRILOSEC Take 1 capsule (20 mg total) by mouth daily.   oxyCODONE-acetaminophen 5-325 MG tablet Commonly known as:  PERCOCET/ROXICET Take 1 tablet  by mouth every 6 (six) hours as needed for severe pain.   pregabalin 100 MG capsule Commonly known as:  LYRICA Take 1 capsule (100 mg total) by mouth 3 (three) times daily.   sulfamethoxazole-trimethoprim 800-160 MG tablet Commonly known as:  BACTRIM DS,SEPTRA DS Take 1 tablet by mouth 2 (two) times daily.   ticagrelor 90 MG Tabs tablet Commonly known as:  BRILINTA Take 1 tablet (90 mg total) by mouth 2 (two) times daily.   valACYclovir 500 MG tablet Commonly known as:  VALTREX Take 1 tablet (500 mg total) by mouth daily.       Allergies  Allergen Reactions  . Shellfish Allergy Anaphylaxis and Swelling  . Diazepam Other (See Comments)    "felt like out of body experience"  . Morphine And Related Itching    Consultations:  Cardiology  General surgery Dr. Rosendo Gros   Procedures/Studies: Ct Femur Left Wo Contrast  Result Date: 06/05/2017 CLINICAL DATA:  Status post incision and drainage of an abscess in the left thigh 06/01/2017. Subsequent encounter. EXAM: CT OF THE LOWER LEFT EXTREMITY WITHOUT CONTRAST TECHNIQUE: Multidetector CT imaging of the lower left extremity was performed according to the standard protocol. COMPARISON:  CT left upper leg 06/01/2017. FINDINGS: Bones/Joint/Cartilage No bony destructive change or periosteal reaction to suggest osteomyelitis is identified. There is some degenerative change about the left knee which appears worst in the medial compartment. Ligaments Suboptimally assessed by CT. Muscles and Tendons Again seen is edema in the deep subcutaneous tissues of the anterior thigh contiguous with anterior and lateral compartment musculature. No intramuscular fluid collection is identified. Fat planes in muscle are preserved. Small, simple lipoma in the adductor musculature is again seen. Soft tissues Since the prior examination, the patient has undergone incision and drainage of a subcutaneous fluid collection in the fatty soft tissues of the medial  thigh. The superficial aspect of wound appears to be packed. A small fluid collection containing a locule air measures 2.5 cm AP by 1.3 cm transverse by 2.9 cm craniocaudal compared to 2.5 cm transverse by 6.6 cm AP by 7.4 cm craniocaudal. There is persistent stranding in subcutaneous fat about the collection consistent with cellulitis. The appearance is mildly improved. IMPRESSION: No new abnormality since the prior examination. Marked decrease in the size of an abscess in subcutaneous fatty tissues with some improvement and changes consistent surrounding with cellulitis. Electronically Signed   By: Marcello Moores  Dalessio M.D.   On: 06/05/2017 16:07   Ct Femur Left Wo Contrast  Result Date: 06/01/2017 CLINICAL DATA:  Left thigh swelling and pain. EXAM: CT OF THE LOWER LEFT EXTREMITY WITHOUT CONTRAST TECHNIQUE: Multidetector CT imaging of the lower left extremity was performed according to the standard protocol. COMPARISON:  None. FINDINGS: Bones/Joint/Cartilage Negative for acute fracture or bone destruction. Osteoarthritis of the left hip and knee with joint space narrowing. Spurring of the femoral condyles consistent with osteoarthritis. No suspicious osseous lesions. Ligaments Suboptimally assessed by CT. Muscles and Tendons Subcutaneous edema and scant tracking fluid overlying the anterior and medial compartmental muscles of the thigh. Soft tissues There is an approximately 7.4 x 6.6 x 2.5 cm subcutaneous abscess along the medial aspect of the mid to distal left thigh with adjacent subcutaneous soft tissue fatty induration consistent with cellulitis. This collection is seen at least 19-20 cm from the inguinal crease. Two small lipomas involving the medial compartment muscles are noted, the largest approximately 2 cm. IMPRESSION: 1. Within the medial aspect of the thigh approximately 20 cm caudad from the left inguinal crease is a subcutaneous abscess measuring 7.4 x 6.6 x 2.5 cm. There is associated skin  thickening and subcutaneous fatty induration consistent with cellulitis. 2. No acute osseous appearing abnormality. There is osteoarthritis of the left knee joint and mild degenerative joint space narrowing of the left hip. Electronically Signed   By: Ashley Royalty M.D.   On: 06/01/2017 21:41    Subjective: No new complaints she feels great wants to go home.  Discharge Exam: Vitals:   06/06/17 0519 06/06/17 0750  BP: (!) 148/68   Pulse: 77   Resp: 18   Temp: 98.4 F (36.9 C)   SpO2: 97% 97%   Vitals:   06/05/17 2100 06/05/17 2106 06/06/17 0519 06/06/17 0750  BP:  (!) 155/67 (!) 148/68   Pulse:  76 77   Resp:  20 18   Temp:  98.5 F (36.9 C) 98.4 F (36.9 C)   TempSrc:  Oral Oral   SpO2: 96% 99% 97% 97%  Weight:      Height:        General: Pt is alert, awake, not in acute distress Cardiovascular: RRR, S1/S2 +, no rubs, no gallops Respiratory: CTA bilaterally, no wheezing, no rhonchi Abdominal: Soft, NT, ND, bowel sounds + Extremities: no edema, no cyanosis    The results of significant diagnostics from this hospitalization (including imaging, microbiology, ancillary and laboratory) are listed below for reference.     Microbiology: Recent Results (from the past 240 hour(s))  Wound or Superficial Culture     Status: None (Preliminary result)   Collection Time: 06/02/17 12:20 AM  Result Value Ref Range Status   Specimen Description ABSCESS LEFT THIGH  Final   Special Requests NONE  Final   Gram Stain   Final    FEW WBC PRESENT,BOTH PMN AND MONONUCLEAR MODERATE GRAM POSITIVE COCCI IN CHAINS MODERATE GRAM NEGATIVE RODS    Culture   Final    MODERATE VIRIDANS STREPTOCOCCUS CULTURE REINCUBATED FOR BETTER GROWTH Performed at [facility], [place] [address] [place], [place] [address]    Report Status PENDING  Incomplete     Labs: BNP (last 3 results) Recent Labs    12/01/16 0009 03/17/17 1300 04/28/17 0003  BNP 124.6* 18.8 01.6   Basic Metabolic  Panel: Recent Labs  Lab 06/01/17 2100 06/03/17 0615 06/05/17 0552 06/06/17 0514  NA 129* 135  --  138  K 5.1 4.8  --  4.9  CL 95* 103  --  107  CO2 22 22  --  24  GLUCOSE 423* 293*  --  210*  BUN 82* 75*  --  60*  CREATININE 2.56* 2.19* 2.22* 2.16*  CALCIUM 8.3* 8.3*  --  8.4*   Liver Function Tests: No results for input(s): AST, ALT, ALKPHOS, BILITOT, PROT, ALBUMIN in the last 168 hours. No results for input(s): LIPASE, AMYLASE in the last 168 hours. No results for input(s): AMMONIA in the last 168 hours. CBC: Recent Labs  Lab 06/01/17 2100 06/02/17 1126 06/03/17 0051 06/03/17 0615 06/06/17 0514  WBC 15.1* 12.4*  --  13.7* 11.1*  NEUTROABS 11.6*  --   --   --   --   HGB 9.3* 7.4* 7.4* 8.2* 8.3*  HCT 27.7* 22.1* 22.4* 24.7* 25.1*  MCV 88.2 88.4  --  88.2 88.7  PLT 304 288  --  272 290   Cardiac Enzymes: No results for input(s): CKTOTAL, CKMB, CKMBINDEX, TROPONINI in the last 168 hours. BNP: Invalid input(s): POCBNP CBG: Recent Labs  Lab 06/05/17 0800 06/05/17 1157 06/05/17 1720 06/05/17 2127 06/06/17 0718  GLUCAP 168* 168* 171* 171* 181*   D-Dimer No results for input(s): DDIMER in the last 72 hours. Hgb A1c No results for input(s): HGBA1C in the last 72 hours. Lipid Profile No results for input(s): CHOL, HDL, LDLCALC, TRIG, CHOLHDL, LDLDIRECT in the last 72 hours. Thyroid function studies No results for input(s): TSH, T4TOTAL, T3FREE, THYROIDAB in the last 72 hours.  Invalid input(s): FREET3 Anemia work up No results for input(s): VITAMINB12, FOLATE, FERRITIN, TIBC, IRON, RETICCTPCT in the last 72 hours. Urinalysis    Component Value Date/Time   COLORURINE YELLOW 06/02/2017 1455   APPEARANCEUR HAZY (A) 06/02/2017 1455   LABSPEC 1.010 06/02/2017 1455   PHURINE 5.0 06/02/2017 1455   GLUCOSEU 50 (A) 06/02/2017 1455   HGBUR LARGE (A) 06/02/2017 1455   BILIRUBINUR NEGATIVE 06/02/2017 1455   BILIRUBINUR neg 12/17/2016 1538   KETONESUR NEGATIVE  06/02/2017 1455   PROTEINUR 30 (A) 06/02/2017 1455   UROBILINOGEN 0.2 12/17/2016 1538   UROBILINOGEN 0.2 05/29/2010 2100   NITRITE NEGATIVE 06/02/2017 1455   LEUKOCYTESUR NEGATIVE 06/02/2017 1455   Sepsis Labs Invalid input(s): PROCALCITONIN,  WBC,  LACTICIDVEN Microbiology Recent Results (from the past 240 hour(s))  Wound or Superficial Culture     Status: None (Preliminary result)   Collection Time: 06/02/17 12:20 AM  Result Value Ref Range Status   Specimen Description ABSCESS LEFT THIGH  Final   Special Requests NONE  Final   Gram Stain   Final    FEW WBC PRESENT,BOTH PMN AND MONONUCLEAR MODERATE GRAM POSITIVE COCCI IN CHAINS MODERATE GRAM NEGATIVE RODS    Culture   Final    MODERATE VIRIDANS STREPTOCOCCUS CULTURE REINCUBATED FOR BETTER GROWTH Performed at [facility], [place] [address] [place], [place] [address]    Report Status PENDING  Incomplete     Time coordinating discharge: Over 30 minutes  SIGNED:   Charlynne Cousins, MD  [facility] 06/06/2017, 8:49 AM Pager   If 7PM-7AM, please contact night-coverage [email or web address] Password [email or web address]

## 2017-06-08 ENCOUNTER — Telehealth: Payer: Self-pay

## 2017-06-08 ENCOUNTER — Other Ambulatory Visit: Payer: Self-pay | Admitting: Family Medicine

## 2017-06-08 MED ORDER — LEVOFLOXACIN 750 MG PO TABS: 750.0000 mg | ORAL_TABLET | ORAL | 0 refills | Status: DC

## 2017-06-08 NOTE — Telephone Encounter (Signed)
Pt was called and informed of new antibiotic, pt request that medication be sent to [facility].

## 2017-06-14 MED FILL — NovoLOG 100 UNIT/ML SOLN: 100 | 27 days supply | Qty: 10 | Fill #1

## 2017-06-14 MED FILL — LANTUS SOLOSTAR 100 UNITS/M: 100 | 30 days supply | Qty: 24 | Fill #1

## 2017-06-14 MED FILL — BYETTA 10 MCG DOSE PEN INJ: 10 | 30 days supply | Qty: 2 | Fill #1

## 2017-06-15 ENCOUNTER — Encounter: Payer: Self-pay | Admitting: Family Medicine

## 2017-06-15 ENCOUNTER — Ambulatory Visit: Payer: Medicaid Other | Attending: Family Medicine | Admitting: Family Medicine

## 2017-06-15 VITALS — BP 166/74 | HR 94 | Temp 98.1°F | Ht 68.0 in | Wt 297.0 lb

## 2017-06-15 DIAGNOSIS — I10 Essential (primary) hypertension: Secondary | ICD-10-CM | POA: Diagnosis not present

## 2017-06-15 DIAGNOSIS — K219 Gastro-esophageal reflux disease without esophagitis: Secondary | ICD-10-CM | POA: Insufficient documentation

## 2017-06-15 DIAGNOSIS — Z7951 Long term (current) use of inhaled steroids: Secondary | ICD-10-CM | POA: Insufficient documentation

## 2017-06-15 DIAGNOSIS — E669 Obesity, unspecified: Secondary | ICD-10-CM | POA: Insufficient documentation

## 2017-06-15 DIAGNOSIS — E1122 Type 2 diabetes mellitus with diabetic chronic kidney disease: Secondary | ICD-10-CM | POA: Diagnosis not present

## 2017-06-15 DIAGNOSIS — Z9889 Other specified postprocedural states: Secondary | ICD-10-CM | POA: Insufficient documentation

## 2017-06-15 DIAGNOSIS — E118 Type 2 diabetes mellitus with unspecified complications: Secondary | ICD-10-CM | POA: Diagnosis not present

## 2017-06-15 DIAGNOSIS — N183 Chronic kidney disease, stage 3 (moderate): Secondary | ICD-10-CM | POA: Insufficient documentation

## 2017-06-15 DIAGNOSIS — Z7982 Long term (current) use of aspirin: Secondary | ICD-10-CM | POA: Diagnosis not present

## 2017-06-15 DIAGNOSIS — J45909 Unspecified asthma, uncomplicated: Secondary | ICD-10-CM | POA: Diagnosis not present

## 2017-06-15 DIAGNOSIS — Z885 Allergy status to narcotic agent status: Secondary | ICD-10-CM | POA: Diagnosis not present

## 2017-06-15 DIAGNOSIS — L02416 Cutaneous abscess of left lower limb: Secondary | ICD-10-CM

## 2017-06-15 DIAGNOSIS — Z888 Allergy status to other drugs, medicaments and biological substances status: Secondary | ICD-10-CM | POA: Insufficient documentation

## 2017-06-15 DIAGNOSIS — E1165 Type 2 diabetes mellitus with hyperglycemia: Secondary | ICD-10-CM | POA: Diagnosis not present

## 2017-06-15 DIAGNOSIS — B1920 Unspecified viral hepatitis C without hepatic coma: Secondary | ICD-10-CM | POA: Diagnosis not present

## 2017-06-15 DIAGNOSIS — Z79899 Other long term (current) drug therapy: Secondary | ICD-10-CM | POA: Insufficient documentation

## 2017-06-15 DIAGNOSIS — Z955 Presence of coronary angioplasty implant and graft: Secondary | ICD-10-CM | POA: Insufficient documentation

## 2017-06-15 DIAGNOSIS — I350 Nonrheumatic aortic (valve) stenosis: Secondary | ICD-10-CM | POA: Diagnosis not present

## 2017-06-15 DIAGNOSIS — I5042 Chronic combined systolic (congestive) and diastolic (congestive) heart failure: Secondary | ICD-10-CM | POA: Diagnosis not present

## 2017-06-15 DIAGNOSIS — Z91013 Allergy to seafood: Secondary | ICD-10-CM | POA: Diagnosis not present

## 2017-06-15 DIAGNOSIS — Z794 Long term (current) use of insulin: Secondary | ICD-10-CM | POA: Insufficient documentation

## 2017-06-15 DIAGNOSIS — K859 Acute pancreatitis without necrosis or infection, unspecified: Secondary | ICD-10-CM | POA: Diagnosis not present

## 2017-06-15 DIAGNOSIS — I2510 Atherosclerotic heart disease of native coronary artery without angina pectoris: Secondary | ICD-10-CM | POA: Diagnosis not present

## 2017-06-15 DIAGNOSIS — I130 Hypertensive heart and chronic kidney disease with heart failure and stage 1 through stage 4 chronic kidney disease, or unspecified chronic kidney disease: Secondary | ICD-10-CM | POA: Diagnosis not present

## 2017-06-15 DIAGNOSIS — IMO0002 Reserved for concepts with insufficient information to code with codable children: Secondary | ICD-10-CM

## 2017-06-15 DIAGNOSIS — E11649 Type 2 diabetes mellitus with hypoglycemia without coma: Secondary | ICD-10-CM | POA: Diagnosis not present

## 2017-06-15 LAB — GLUCOSE, POCT (MANUAL RESULT ENTRY): POC Glucose: 196 mg/dl — AB (ref 70–99)

## 2017-06-15 NOTE — Progress Notes (Signed)
Subjective:  Patient ID: [name], female    DOB: [birth year]  Age: 61 y.o. MRN: [id number]  CC: Fever and Wound Check   HPI [name] is a 61 year old female with a history of uncontrolled type 2 diabetes mellitus (A1c 9.8) hypertension, CHF (echo from 02/2017, EF 60-65%, no regional wall motion abnormalities and grade 1 diastolic dysfunction ), CAD (s/p staged PCI with DES to proximal LAD currently on dual antiplatelet therapy with aspirin and Brilinta), asthma, CK D stage III, anemia, GERD who presents to the clinic after her home care nurse called because she had a temperature of 99.2.  The patient was hospitalized for left thigh abscess at [facility] from 06/01/17 through 06/06/17 after she had failed outpatient treatment with doxycycline after an I&D at the ED on 05/24/17. CT of the left femur revealed abscess with surrounding cellulitis and she underwent an incision and drainageand IV vancomycin and Zosyn; abscess culture was positive for strep viridans. Seen by general surgery and cardiology during her hospital course. She did have a bleeding episode for which Brilinta was placed on hold for 3 days and she received 1 unit PRBC. Her condition stabilized and her antibiotics were switched to Bactrim after which she was discharged with recommendations to follow-up with general surgery.  She has not had an appointment with general surgery but has had daily wound dressing changes which are done by her nurse twice a week and on other days by herself and her daughter.  I had changed her Bactrim to Levaquin based on her abscess culture sensitivity and she has been compliant with Levaquin but complains of poor appetite hence has noticed low sugars in the 60s. She has been taking NovoLog 25 units twice daily in addition to her Lantus and Byetta. She has been taking Percocet pills and Tylenol No. 3 due to severity of the pain. She has not noticed any fever herself.   Past Medical  History:  Diagnosis Date  . Aortic stenosis    Echo 8/18: mean 13, peak 28, LVOT/AV mean velocity 0.51  . Asthma    As a child   . Bronchitis   . CAD (coronary artery disease)    a. 09/2016: 50% Ost 1st Mrg stenosis, 50% 2nd Mrg stenosis, 20% Mid-Cx, 95% Prox LAD, 40% mid-LAD, and 10% dist-LAD stenosis. Staged PCI with DES to Prox-LAD.   [name] Chronic combined systolic and diastolic CHF (congestive heart failure) ([place]) 2011   echo 2/18: EF 55-60, normal wall motion, grade 2 diastolic dysfunction, trivial AI // echo 3/18: Septal and apical HK, EF 45-50, normal wall motion, trivial AI, mild LAE, PASP 38 // echo 8/18: EF 60-65, normal wall motion, grade 1 diastolic dysfunction, calcified aortic valve leaflets, mild aortic stenosis (mean 13, peak 28, LVOT/AV mean velocity 0.51), mild AI, moderate MAC, mild LAE, trivial TR   . Complication of anesthesia   . Diabetes mellitus Dx 1989  . Hepatitis C Dx 2013  . Hypertension Dx 1989  . Obesity   . Pancreatitis 2013  . Refusal of blood transfusions as patient is Jehovah's Witness   . Tendinitis   . Ulcer 2010    Past Surgical History:  Procedure Laterality Date  . CHOLECYSTECTOMY    . CORONARY STENT INTERVENTION N/A 09/18/2016   Procedure: Coronary Stent Intervention;  Surgeon: Troy Sine, MD;  Location: [place] [facility];  Service: Cardiovascular;  Laterality: N/A;  . EYE SURGERY    . KNEE ARTHROSCOPY    .  LEFT HEART CATH N/A 09/18/2016   Procedure: Left Heart Cath;  Surgeon: Troy Sine, MD;  Location: [place] [facility];  Service: Cardiovascular;  Laterality: N/A;  . LEFT HEART CATH AND CORONARY ANGIOGRAPHY N/A 09/16/2016   Procedure: Left Heart Cath and Coronary Angiography;  Surgeon: Burnell Blanks, MD;  Location: [place] [facility];  Service: Cardiovascular;  Laterality: N/A;  . LEFT HEART CATH AND CORONARY ANGIOGRAPHY N/A 04/29/2017   Procedure: LEFT HEART CATH AND CORONARY ANGIOGRAPHY;  Surgeon: Nelva Bush, MD;  Location:  [place] [facility];  Service: Cardiovascular;  Laterality: N/A;  . TUBAL LIGATION    . TUBAL LIGATION      Allergies  Allergen Reactions  . Shellfish Allergy Anaphylaxis and Swelling  . Diazepam Other (See Comments)    "felt like out of body experience"  . Morphine And Related Itching     Outpatient Medications Prior to Visit  Medication Sig Dispense Refill  . acetaminophen-codeine (TYLENOL #3) 300-30 MG tablet Take 1 tablet by mouth every 12 (twelve) hours as needed for severe pain. For osteoarthritis (unable to take NSAIDs due to CKD) 30 tablet 1  . albuterol (PROVENTIL) (2.5 MG/3ML) 0.083% nebulizer solution Take 3 mLs (2.5 mg total) by nebulization every 6 (six) hours as needed for wheezing or shortness of breath. 150 mL 1  . albuterol (VENTOLIN HFA) 108 (90 Base) MCG/ACT inhaler Inhale 2 puffs into the lungs every 4 (four) hours as needed for wheezing or shortness of breath. 54 g 3  . allopurinol (ZYLOPRIM) 100 MG tablet Take 1 tablet (100 mg total) by mouth daily. 30 tablet 6  . amLODipine (NORVASC) 10 MG tablet Take 1 tablet (10 mg total) by mouth daily. 90 tablet 1  . aspirin EC 81 MG EC tablet Take 1 tablet (81 mg total) by mouth daily.    [name] atorvastatin (LIPITOR) 80 MG tablet Take 1 tablet (80 mg total) by mouth daily at 6 PM. 90 tablet 3  . Blood Glucose Monitoring Suppl (ACCU-CHEK AVIVA) device Use as instructed daily. 1 each 0  . carvedilol (COREG) 25 MG tablet TAKE 1 TABLET BY MOUTH 2 TIMES DAILY WITH A MEAL. 180 tablet 3  . colchicine 0.6 MG tablet Take 0.5 tablets (0.3 mg total) by mouth daily. (Patient taking differently: Take 0.6 mg by mouth daily. )    . exenatide (BYETTA 10 MCG PEN) 10 MCG/0.04ML SOPN injection Inject 0.04 mLs (10 mcg total) into the skin 2 (two) times daily with a meal. 30 mL 1  . ferrous sulfate 325 (65 FE) MG tablet Take 1 tablet (325 mg total) by mouth 2 (two) times daily with a meal. 180 tablet 1  . fluticasone (FLONASE) 50 MCG/ACT nasal spray  Place 2 sprays into both nostrils daily as needed for allergies or rhinitis. 16 g 5  . Fluticasone-Salmeterol (ADVAIR DISKUS) 250-50 MCG/DOSE AEPB Inhale 1 puff into the lungs 2 (two) times daily. (Patient taking differently: Inhale 1 puff into the lungs 2 (two) times daily as needed (shortness of breath). ) 60 each 3  . furosemide (LASIX) 40 MG tablet Take 1 tablet (40 mg total) by mouth daily. 90 tablet 3  . gabapentin (NEURONTIN) 100 MG capsule TAKE 2 CAPSULES BY MOUTH 3 TIMES A DAY. (Patient taking differently: Take 200 mg by mouth 3 (three) times daily. ) 180 capsule 1  . glucose blood (ACCU-CHEK AVIVA) test strip Use as instructed daily 100 each 12  . glucose blood (TRUE METRIX BLOOD GLUCOSE  TEST) test strip Use as instructed 100 each 12  . hydrALAZINE (APRESOLINE) 50 MG tablet Take 1 tablet (50 mg total) by mouth 3 (three) times daily. 270 tablet 3  . HYDROcodone-acetaminophen (NORCO/VICODIN) 5-325 MG tablet Take 1 tablet every 4 (four) hours as needed by mouth for moderate pain. 15 tablet 0  . hydrOXYzine (ATARAX/VISTARIL) 50 MG tablet TAKE 1 TABLET (50 MG TOTAL) BY MOUTH 3 (THREE) TIMES DAILY AS NEEDED. (Patient taking differently: Take 50 mg by mouth 3 (three) times daily as needed for itching. ) 90 tablet 0  . insulin aspart (NOVOLOG) 100 UNIT/ML injection 0-12 units subcutaneously 3 times daily before meals according to sliding scale 3 vial 5  . Insulin Glargine (LANTUS SOLOSTAR) 100 UNIT/ML Solostar Pen Inject 40 Units into the skin 2 (two) times daily. (Patient taking differently: Inject 30 Units into the skin 2 (two) times daily. ) 24 mL 2  . isosorbide mononitrate (IMDUR) 60 MG 24 hr tablet Take 1 tablet (60 mg total) by mouth daily. 30 tablet 6  . ketotifen (ZADITOR) 0.025 % ophthalmic solution Place 1 drop into both eyes daily as needed (for dry eyes). 10 mL 2  . Lancet Devices (ACCU-CHEK SOFTCLIX) lancets Use as instructed daily. 1 each 5  . levofloxacin (LEVAQUIN) 750 MG tablet Take  1 tablet (750 mg total) by mouth every other day. 7 tablet 0  . nitroGLYCERIN (NITROSTAT) 0.4 MG SL tablet Place 1 tablet (0.4 mg total) under the tongue every 5 (five) minutes x 3 doses as needed for chest pain. 25 tablet 12  . omeprazole (PRILOSEC) 20 MG capsule Take 1 capsule (20 mg total) by mouth daily. 90 capsule 1  . oxyCODONE-acetaminophen (PERCOCET/ROXICET) 5-325 MG tablet Take 1 tablet by mouth every 6 (six) hours as needed for severe pain. 15 tablet 0  . pregabalin (LYRICA) 100 MG capsule Take 1 capsule (100 mg total) by mouth 3 (three) times daily. 90 capsule 1  . ticagrelor (BRILINTA) 90 MG TABS tablet Take 1 tablet (90 mg total) by mouth 2 (two) times daily. 180 tablet 2  . valACYclovir (VALTREX) 500 MG tablet Take 1 tablet (500 mg total) by mouth daily. 90 tablet 3  . docusate sodium (COLACE) 100 MG capsule Take 1 capsule (100 mg total) every 12 (twelve) hours by mouth. (Patient not taking: Reported on 06/01/2017) 30 capsule 0   No facility-administered medications prior to visit.     ROS Review of Systems  Constitutional: Positive for appetite change. Negative for activity change and fatigue.  HENT: Negative for congestion, sinus pressure and sore throat.   Eyes: Negative for visual disturbance.  Respiratory: Negative for cough, chest tightness, shortness of breath and wheezing.   Cardiovascular: Negative for chest pain and palpitations.  Gastrointestinal: Negative for abdominal distention, abdominal pain and constipation.  Endocrine: Negative for polydipsia.  Genitourinary: Negative for dysuria and frequency.  Musculoskeletal: Negative for arthralgias and back pain.  Skin: Positive for wound. Negative for rash.  Neurological: Negative for tremors, light-headedness and numbness.  Hematological: Does not bruise/bleed easily.  Psychiatric/Behavioral: Negative for agitation and behavioral problems.    Objective:  BP (!) 166/74   Pulse 94   Temp 98.1 F (36.7 C) (Oral)    Ht _0  (1.727 m)   Wt 297 lb (134.7 kg)   SpO2 99%   BMI 45.16 kg/m   BP/Weight 06/15/2017 06/06/2017 28/41/3244  Systolic BP 010 272 -  Diastolic BP 74 68 -  Wt. (Lbs) 297 - 292  BMI 45.16 - 44.4      Physical Exam  Constitutional: She is oriented to person, place, and time. She appears well-developed and well-nourished.  Cardiovascular: Normal rate, normal heart sounds and intact distal pulses.  No murmur heard. Pulmonary/Chest: Effort normal and breath sounds normal. She has no wheezes. She has no rales. She exhibits no tenderness.  Abdominal: Soft. Bowel sounds are normal. She exhibits no distension and no mass. There is no tenderness.  Musculoskeletal: Normal range of motion.  Neurological: She is alert and oriented to person, place, and time.  Skin:  Left thigh abscess with surrounding hardness and tenderness and some purulent drainage  Psychiatric: She has a normal mood and affect.    Lab Results  Component Value Date   HGBA1C 9.2 05/28/2017    Assessment & Plan:   1. Diabetes mellitus type 2, uncontrolled, with complications ([place]) Controlled with A1c of 9.2. Due to current complaints of hypoglycemia advised to hold off on fixed NovoLog regimen and commence NovoLog sliding scale Continue Lantus and Byetta - POCT glucose (manual entry)  2. Essential hypertension Uncontrolled We will make no regimen changes at this time as elevation could be secondary to pain Continue antihypertensive  3. Abscess of left thigh Status post incision and drainage during hospitalization Currently on Levaquin. [facility] surgery and obtained appointment in 2 days She currently has Tylenol No. 3 which she received from the clinic and Percocet which she receives at discharge; I have advised her to take Percocet only when pain is severe and Tylenol No. 3 when pain is less severe and not to combine both narcotics. Dressing change performed in the clinic with iodoform  gauze, ABD pad and ACE wrap Discussed return to ED precautions. - CBC with Differential/Platelet   No orders of the defined types were placed in this encounter.   Follow-up: Return in about 2 weeks (around 06/29/2017) for Follow-up of left thigh abscess.   Arnoldo Morale MD

## 2017-06-15 NOTE — Patient Instructions (Signed)
Skin Abscess A skin abscess is an infected area on or under your skin that contains pus and other material. An abscess can happen almost anywhere on your body. Some abscesses break open (rupture) on their own. Most continue to get worse unless they are treated. The infection can spread deeper into the body and into your blood, which can make you feel sick. Treatment usually involves draining the abscess. Follow these instructions at home: Abscess Care  If you have an abscess that has not drained, place a warm, clean, wet washcloth over the abscess several times a day. Do this as told by your doctor.  Follow instructions from your doctor about how to take care of your abscess. Make sure you: ? Cover the abscess with a bandage (dressing). ? Change your bandage or gauze as told by your doctor. ? Wash your hands with soap and water before you change the bandage or gauze. If you cannot use soap and water, use hand sanitizer.  Check your abscess every day for signs that the infection is getting worse. Check for: ? More redness, swelling, or pain. ? More fluid or blood. ? Warmth. ? More pus or a bad smell. Medicines   Take over-the-counter and prescription medicines only as told by your doctor.  If you were prescribed an antibiotic medicine, take it as told by your doctor. Do not stop taking the antibiotic even if you start to feel better. General instructions  To avoid spreading the infection: ? Do not share personal care items, towels, or hot tubs with others. ? Avoid making skin-to-skin contact with other people.  Keep all follow-up visits as told by your doctor. This is important. Contact a doctor if:  You have more redness, swelling, or pain around your abscess.  You have more fluid or blood coming from your abscess.  Your abscess feels warm when you touch it.  You have more pus or a bad smell coming from your abscess.  You have a fever.  Your muscles ache.  You have  chills.  You feel sick. Get help right away if:  You have very bad (severe) pain.  You see red streaks on your skin spreading away from the abscess. This information is not intended to replace advice given to you by your health care provider. Make sure you discuss any questions you have with your health care provider. Document Released: 12/16/2007 Document Revised: 02/23/2016 Document Reviewed: 05/08/2015 Elsevier Interactive Patient Education  2018 [employer]  

## 2017-06-16 LAB — CBC WITH DIFFERENTIAL/PLATELET
Basophils Absolute: 0 10*3/uL (ref 0.0–0.2)
Basos: 0 %
EOS (ABSOLUTE): 0.2 10*3/uL (ref 0.0–0.4)
Eos: 2 %
Hematocrit: 30.3 % — ABNORMAL LOW (ref 34.0–46.6)
Hemoglobin: 10 g/dL — ABNORMAL LOW (ref 11.1–15.9)
Immature Grans (Abs): 0 10*3/uL (ref 0.0–0.1)
Immature Granulocytes: 0 %
Lymphocytes Absolute: 2.2 10*3/uL (ref 0.7–3.1)
Lymphs: 22 %
MCH: 28.7 pg (ref 26.6–33.0)
MCHC: 33 g/dL (ref 31.5–35.7)
MCV: 87 fL (ref 79–97)
Monocytes Absolute: 0.9 10*3/uL (ref 0.1–0.9)
Monocytes: 9 %
Neutrophils Absolute: 6.8 10*3/uL (ref 1.4–7.0)
Neutrophils: 67 %
Platelets: 384 10*3/uL — ABNORMAL HIGH (ref 150–379)
RBC: 3.49 x10E6/uL — ABNORMAL LOW (ref 3.77–5.28)
RDW: 15.9 % — ABNORMAL HIGH (ref 12.3–15.4)
WBC: 10.2 10*3/uL (ref 3.4–10.8)

## 2017-06-17 ENCOUNTER — Telehealth: Payer: Self-pay

## 2017-06-17 ENCOUNTER — Telehealth: Payer: Self-pay | Admitting: Family Medicine

## 2017-06-17 ENCOUNTER — Emergency Department (HOSPITAL_COMMUNITY)
Admission: EM | Admit: 2017-06-17 | Discharge: 2017-06-18 | Disposition: A | Discharge status: Home / Self Care | Payer: Medicaid Other | Attending: Emergency Medicine | Admitting: Emergency Medicine

## 2017-06-17 ENCOUNTER — Emergency Department (HOSPITAL_COMMUNITY): Payer: Medicaid Other

## 2017-06-17 ENCOUNTER — Encounter (HOSPITAL_COMMUNITY): Payer: Self-pay | Admitting: Emergency Medicine

## 2017-06-17 DIAGNOSIS — Z7982 Long term (current) use of aspirin: Secondary | ICD-10-CM | POA: Diagnosis not present

## 2017-06-17 DIAGNOSIS — R079 Chest pain, unspecified: Secondary | ICD-10-CM

## 2017-06-17 DIAGNOSIS — I5042 Chronic combined systolic (congestive) and diastolic (congestive) heart failure: Secondary | ICD-10-CM | POA: Insufficient documentation

## 2017-06-17 DIAGNOSIS — R531 Weakness: Secondary | ICD-10-CM | POA: Diagnosis not present

## 2017-06-17 DIAGNOSIS — L02416 Cutaneous abscess of left lower limb: Secondary | ICD-10-CM | POA: Insufficient documentation

## 2017-06-17 DIAGNOSIS — N184 Chronic kidney disease, stage 4 (severe): Secondary | ICD-10-CM | POA: Insufficient documentation

## 2017-06-17 DIAGNOSIS — Z794 Long term (current) use of insulin: Secondary | ICD-10-CM | POA: Insufficient documentation

## 2017-06-17 DIAGNOSIS — I130 Hypertensive heart and chronic kidney disease with heart failure and stage 1 through stage 4 chronic kidney disease, or unspecified chronic kidney disease: Secondary | ICD-10-CM | POA: Insufficient documentation

## 2017-06-17 DIAGNOSIS — Z79899 Other long term (current) drug therapy: Secondary | ICD-10-CM | POA: Diagnosis not present

## 2017-06-17 DIAGNOSIS — Z87891 Personal history of nicotine dependence: Secondary | ICD-10-CM | POA: Diagnosis not present

## 2017-06-17 DIAGNOSIS — R0601 Orthopnea: Secondary | ICD-10-CM | POA: Diagnosis not present

## 2017-06-17 DIAGNOSIS — E1165 Type 2 diabetes mellitus with hyperglycemia: Secondary | ICD-10-CM | POA: Insufficient documentation

## 2017-06-17 DIAGNOSIS — J45909 Unspecified asthma, uncomplicated: Secondary | ICD-10-CM | POA: Diagnosis not present

## 2017-06-17 DIAGNOSIS — R739 Hyperglycemia, unspecified: Secondary | ICD-10-CM

## 2017-06-17 LAB — CBC
HCT: 29 % — ABNORMAL LOW (ref 36.0–46.0)
Hemoglobin: 9.4 g/dL — ABNORMAL LOW (ref 12.0–15.0)
MCH: 28.1 pg (ref 26.0–34.0)
MCHC: 32.4 g/dL (ref 30.0–36.0)
MCV: 86.8 fL (ref 78.0–100.0)
Platelets: 335 10*3/uL (ref 150–400)
RBC: 3.34 MIL/uL — ABNORMAL LOW (ref 3.87–5.11)
RDW: 14.5 % (ref 11.5–15.5)
WBC: 7.6 10*3/uL (ref 4.0–10.5)

## 2017-06-17 LAB — BASIC METABOLIC PANEL
Anion gap: 13 (ref 5–15)
BUN: 59 mg/dL — ABNORMAL HIGH (ref 6–20)
CO2: 23 mmol/L (ref 22–32)
Calcium: 8.8 mg/dL — ABNORMAL LOW (ref 8.9–10.3)
Chloride: 98 mmol/L — ABNORMAL LOW (ref 101–111)
Creatinine, Ser: 2.35 mg/dL — ABNORMAL HIGH (ref 0.44–1.00)
GFR calc Af Amer: 25 mL/min — ABNORMAL LOW (ref >60)
GFR calc non Af Amer: 22 mL/min — ABNORMAL LOW (ref >60)
Glucose, Bld: 217 mg/dL — ABNORMAL HIGH (ref 65–99)
Potassium: 3.4 mmol/L — ABNORMAL LOW (ref 3.5–5.1)
Sodium: 134 mmol/L — ABNORMAL LOW (ref 135–145)

## 2017-06-17 LAB — BRAIN NATRIURETIC PEPTIDE: B Natriuretic Peptide: 9.5 pg/mL (ref 0.0–100.0)

## 2017-06-17 LAB — I-STAT BETA HCG BLOOD, ED (MC, WL, AP ONLY): I-stat hCG, quantitative: <5 m[IU]/mL (ref <5)

## 2017-06-17 LAB — I-STAT TROPONIN, ED: Troponin i, poc: 0.01 ng/mL (ref 0.00–0.08)

## 2017-06-17 MED FILL — BRILINTA 90 MG TABLET: 90 | 30 days supply | Qty: 60 | Fill #2

## 2017-06-17 MED FILL — VALACYCLOVIR HCL 500 MG TAB: 500 | 30 days supply | Qty: 30 | Fill #3

## 2017-06-17 MED FILL — hydrALAZINE HCL 50 MG TABS: 50 | 30 days supply | Qty: 90 | Fill #3

## 2017-06-17 MED FILL — hydrOXYzine HCL 50 MG TABS: 50 | 30 days supply | Qty: 90 | Fill #2

## 2017-06-17 MED FILL — CARVEDILOL 25 MG TABLET: 25 | 30 days supply | Qty: 60 | Fill #3

## 2017-06-17 NOTE — ED Triage Notes (Signed)
Patient here from home with complaints of chest pain and SOB that started today. Central chest pain radiating into back. Hx of CHF. Reports that she is unable to lie flat.

## 2017-06-17 NOTE — Telephone Encounter (Signed)
Pt was called and informed of lab results via VM.

## 2017-06-17 NOTE — Telephone Encounter (Signed)
Patient called about cold and sour stomach and no energy. Please fu

## 2017-06-18 ENCOUNTER — Other Ambulatory Visit: Payer: Self-pay

## 2017-06-18 LAB — URINALYSIS, ROUTINE W REFLEX MICROSCOPIC
Bilirubin Urine: NEGATIVE
Glucose, UA: NEGATIVE mg/dL
Hgb urine dipstick: NEGATIVE
Ketones, ur: NEGATIVE mg/dL
Leukocytes, UA: NEGATIVE
Nitrite: NEGATIVE
Protein, ur: 100 mg/dL — AB
Specific Gravity, Urine: 1.009 (ref 1.005–1.030)
pH: 5 (ref 5.0–8.0)

## 2017-06-18 MED ORDER — SODIUM CHLORIDE 0.9 % IV BOLUS (SEPSIS)
500.0000 mL | Freq: Once | INTRAVENOUS | Status: AC
  Administered 2017-06-18: 500 mL via INTRAVENOUS

## 2017-06-18 MED ORDER — OXYCODONE-ACETAMINOPHEN 5-325 MG PO TABS
1.0000 | ORAL_TABLET | Freq: Once | ORAL | Status: AC
  Administered 2017-06-18: 1 via ORAL
  Filled 2017-06-18: qty 1

## 2017-06-18 MED ORDER — OXYCODONE-ACETAMINOPHEN 5-325 MG PO TABS: 1.0000 | ORAL_TABLET | Freq: Four times a day (QID) | ORAL | 0 refills | Status: DC | PRN

## 2017-06-18 NOTE — Discharge Instructions (Signed)
FOLLOW UP WITH YOUR DOCTOR FOR RECHECK OF SYMPTOMS OF WEAKNESS, SHORTNESS OF BREATH, NONSPECIFIC CHEST PAIN AND FOR RECHECK OF YOUR BLOOD SUGAR CONTROL.

## 2017-06-18 NOTE — Telephone Encounter (Signed)
Pt would like to know what she can take for these symptoms.

## 2017-06-18 NOTE — Telephone Encounter (Signed)
She was seen at the ED and this was addressed.

## 2017-06-18 NOTE — ED Provider Notes (Signed)
[facility] DEPT Provider Note   CSN: [id number] Arrival date & time: 06/17/17  2036     History   Chief Complaint Chief Complaint  Patient presents with  . Chest Pain  . Shortness of Breath    HPI [name] is a 61 y.o. female.  Patient with a history of CHF, CAD, DM, HLD, HTN, asthma, GERD, Hepatitis C, OSA, CKD presents to the emergency department with multiple complaints including chest pain, SOB or orthopnea that started yesterday. She feels like she is dehydrated but denies N, V, D and continues to drink plenty of fluids. No recent change in her Lasix dosing. She feels profoundly weak today like she can't get herself up and around. She has a healing wound from abscess on left thigh that is painful and continues to drain. She was recently treated by I&D and IV abx inpatient, discharged home on 11/25. She continues to take antibiotics now. She reports being seen by PCP 2 days ago and by her surgeon yesterday morning (06/17/17) for in-office recheck and was told to continue her current treatment regimen. She has a home health nurse come to her house to assist with care. She felt increasingly weak today with onset of chest pain and SOB and came to the emergency department for further evaluation. No fever, cough, congestion, urinary symptoms.    The history is provided by the patient. No language interpreter was used.  Chest Pain   Associated symptoms include shortness of breath (Orthopnea) and weakness. Pertinent negatives include no diaphoresis, no fever and no headaches.  Shortness of Breath  Associated symptoms include chest pain and leg swelling. Pertinent negatives include no fever and no headaches.    Past Medical History:  Diagnosis Date  . Aortic stenosis    Echo 8/18: mean 13, peak 28, LVOT/AV mean velocity 0.51  . Asthma    As a child   . Bronchitis   . CAD (coronary artery disease)    a. 09/2016: 50% Ost 1st Mrg stenosis, 50% 2nd Mrg  stenosis, 20% Mid-Cx, 95% Prox LAD, 40% mid-LAD, and 10% dist-LAD stenosis. Staged PCI with DES to Prox-LAD.   [name] Chronic combined systolic and diastolic CHF (congestive heart failure) ([place]) 2011   echo 2/18: EF 55-60, normal wall motion, grade 2 diastolic dysfunction, trivial AI // echo 3/18: Septal and apical HK, EF 45-50, normal wall motion, trivial AI, mild LAE, PASP 38 // echo 8/18: EF 60-65, normal wall motion, grade 1 diastolic dysfunction, calcified aortic valve leaflets, mild aortic stenosis (mean 13, peak 28, LVOT/AV mean velocity 0.51), mild AI, moderate MAC, mild LAE, trivial TR   . Complication of anesthesia   . Diabetes mellitus Dx 1989  . Hepatitis C Dx 2013  . Hypertension Dx 1989  . Obesity   . Pancreatitis 2013  . Refusal of blood transfusions as patient is Jehovah's Witness   . Tendinitis   . Ulcer 2010    Patient Active Problem List   Diagnosis Date Noted  . Abscess of left thigh 06/02/2017  . Acute metabolic encephalopathy 96/28/3662  . Chest pain 04/29/2017  . Chest pain with moderate risk of acute coronary syndrome 04/28/2017  . Acute on chronic combined systolic and diastolic CHF, NYHA class 4 ([place]) 03/19/2017  . Unstable angina ([place]) 03/17/2017  . Aortic stenosis   . Herpes simplex infection of genitourinary system 02/12/2017  . Chronic pain of right knee 02/12/2017  . Chronic gout due to renal impairment of multiple sites  without tophus 02/12/2017  . Esophageal dysphagia 02/12/2017  . Osteoarthritis 01/22/2017  . Diabetic hyperosmolar non-ketotic state ([place]) 12/13/2016  . CAD (coronary artery disease) 12/13/2016  . Hyperlipidemia 12/13/2016  . Hyperphosphatemia 12/13/2016  . History of non-ST elevation myocardial infarction (NSTEMI) 09/13/2016  . Acute myopericarditis   . Injury of left hand 02/24/2016  . Asthma 11/29/2015  . Trigger middle finger 11/25/2015  . Depression 09/05/2015  . GERD (gastroesophageal reflux disease) 03/25/2015  . Precordial pain    . Environmental allergies 03/14/2015  . Hep C w/o coma, chronic ([address]) 01/31/2015  . Diabetic neuropathy ([place]) 01/31/2015  . OSA (obstructive sleep apnea) 01/01/2015  . Poor dentition 11/13/2014  . Essential hypertension 10/08/2014  . Obesity 10/08/2014  . CKD (chronic kidney disease) stage 4, GFR 15-29 ml/min (H[facility]) 10/08/2014  . Diabetes mellitus type 2, uncontrolled, with complications ([place]) 56/31/4970    Class: Chronic  . Anemia in stage 3 chronic kidney disease ([place]) 03/25/2012  . Chronic combined systolic and diastolic CHF (congestive heart failure) ([place]) 07/13/2009    Past Surgical History:  Procedure Laterality Date  . CHOLECYSTECTOMY    . CORONARY STENT INTERVENTION N/A 09/18/2016   Procedure: Coronary Stent Intervention;  Surgeon: Troy Sine, MD;  Location: [facility];  Service: Cardiovascular;  Laterality: N/A;  . EYE SURGERY    . KNEE ARTHROSCOPY    . LEFT HEART CATH N/A 09/18/2016   Procedure: Left Heart Cath;  Surgeon: Troy Sine, MD;  Location: [facility];  Service: Cardiovascular;  Laterality: N/A;  . LEFT HEART CATH AND CORONARY ANGIOGRAPHY N/A 09/16/2016   Procedure: Left Heart Cath and Coronary Angiography;  Surgeon: Burnell Blanks, MD;  Location: [facility];  Service: Cardiovascular;  Laterality: N/A;  . LEFT HEART CATH AND CORONARY ANGIOGRAPHY N/A 04/29/2017   Procedure: LEFT HEART CATH AND CORONARY ANGIOGRAPHY;  Surgeon: Nelva Bush, MD;  Location: [facility];  Service: Cardiovascular;  Laterality: N/A;  . TUBAL LIGATION    . TUBAL LIGATION      OB History    No data available       Home Medications    Prior to Admission medications   Medication Sig Start Date End Date Taking? Authorizing Provider  acetaminophen-codeine (TYLENOL #3) 300-30 MG tablet Take 1 tablet by mouth every 12 (twelve) hours as needed for severe pain. For osteoarthritis (unable to take NSAIDs due to CKD) 04/14/17  Yes Amao, Charlane Ferretti, MD    albuterol (PROVENTIL) (2.5 MG/3ML) 0.083% nebulizer solution Take 3 mLs (2.5 mg total) by nebulization every 6 (six) hours as needed for wheezing or shortness of breath. 12/17/16  Yes Argentina Donovan, PA-C  albuterol (VENTOLIN HFA) 108 (90 Base) MCG/ACT inhaler Inhale 2 puffs into the lungs every 4 (four) hours as needed for wheezing or shortness of breath. 08/03/16  Yes Funches, Josalyn, MD  allopurinol (ZYLOPRIM) 100 MG tablet Take 1 tablet (100 mg total) by mouth daily. 02/12/17  Yes Funches, Josalyn, MD  amLODipine (NORVASC) 10 MG tablet Take 1 tablet (10 mg total) by mouth daily. 04/14/17  Yes Arnoldo Morale, MD  aspirin EC 81 MG EC tablet Take 1 tablet (81 mg total) by mouth daily. 09/19/16  Yes Strader, [place] M, PA-C  atorvastatin (LIPITOR) 80 MG tablet Take 1 tablet (80 mg total) by mouth daily at 6 PM. 02/12/17  Yes Funches, Josalyn, MD  carvedilol (COREG) 25 MG tablet TAKE 1 TABLET BY MOUTH 2 TIMES DAILY WITH A  MEAL. 02/12/17  Yes Funches, Josalyn, MD  colchicine 0.6 MG tablet Take 0.5 tablets (0.3 mg total) by mouth daily. Patient taking differently: Take 0.6 mg by mouth daily.  05/03/17  Yes Velna Hatchet, MD  exenatide (BYETTA 10 MCG PEN) 10 MCG/0.04ML SOPN injection Inject 0.04 mLs (10 mcg total) into the skin 2 (two) times daily with a meal. 04/30/17  Yes Amao, Charlane Ferretti, MD  ferrous sulfate 325 (65 FE) MG tablet Take 1 tablet (325 mg total) by mouth 2 (two) times daily with a meal. 04/14/17  Yes Amao, Enobong, MD  fluticasone (FLONASE) 50 MCG/ACT nasal spray Place 2 sprays into both nostrils daily as needed for allergies or rhinitis. 01/07/17  Yes Funches, Josalyn, MD  Fluticasone-Salmeterol (ADVAIR DISKUS) 250-50 MCG/DOSE AEPB Inhale 1 puff into the lungs 2 (two) times daily. Patient taking differently: Inhale 1 puff into the lungs 2 (two) times daily as needed (shortness of breath).  12/17/16  Yes Argentina Donovan, PA-C  furosemide (LASIX) 40 MG tablet Take 1 tablet (40 mg total) by mouth  daily. 05/12/17 05/12/18 Yes Simmons, Brittainy M, PA-C  gabapentin (NEURONTIN) 100 MG capsule TAKE 2 CAPSULES BY MOUTH 3 TIMES A DAY. Patient taking differently: Take 200 mg by mouth 3 (three) times daily.  05/10/17  Yes Arnoldo Morale, MD  hydrALAZINE (APRESOLINE) 50 MG tablet Take 1 tablet (50 mg total) by mouth 3 (three) times daily. 02/12/17  Yes Funches, Josalyn, MD  hydrOXYzine (ATARAX/VISTARIL) 50 MG tablet TAKE 1 TABLET (50 MG TOTAL) BY MOUTH 3 (THREE) TIMES DAILY AS NEEDED. Patient taking differently: Take 50 mg by mouth 3 (three) times daily as needed for itching.  03/02/17  Yes Jegede, Olugbemiga E, MD  insulin aspart (NOVOLOG) 100 UNIT/ML injection 0-12 units subcutaneously 3 times daily before meals according to sliding scale 04/14/17  Yes Amao, Charlane Ferretti, MD  Insulin Glargine (LANTUS SOLOSTAR) 100 UNIT/ML Solostar Pen Inject 40 Units into the skin 2 (two) times daily. Patient taking differently: Inject 30 Units into the skin 2 (two) times daily.  04/14/17  Yes Arnoldo Morale, MD  isosorbide mononitrate (IMDUR) 60 MG 24 hr tablet Take 1 tablet (60 mg total) by mouth daily. 05/05/17  Yes Lyda Jester M, PA-C  ketotifen (ZADITOR) 0.025 % ophthalmic solution Place 1 drop into both eyes daily as needed (for dry eyes). 07/27/16  Yes Funches, Josalyn, MD  nitroGLYCERIN (NITROSTAT) 0.4 MG SL tablet Place 1 tablet (0.4 mg total) under the tongue every 5 (five) minutes x 3 doses as needed for chest pain. 03/19/17  Yes Barrett, Evelene Croon, PA-C  omeprazole (PRILOSEC) 20 MG capsule Take 1 capsule (20 mg total) by mouth daily. 04/14/17  Yes Arnoldo Morale, MD  oxyCODONE-acetaminophen (PERCOCET/ROXICET) 5-325 MG tablet Take 1 tablet by mouth every 6 (six) hours as needed for severe pain. 06/06/17  Yes Charlynne Cousins, MD  PEDIALYTE (PEDIALYTE) SOLN Take 240 mLs by mouth every 6 (six) hours.    Yes [provider]  pregabalin (LYRICA) 100 MG capsule Take 1 capsule (100 mg total) by mouth 3 (three)  times daily. 05/03/17  Yes Arnoldo Morale, MD  ticagrelor (BRILINTA) 90 MG TABS tablet Take 1 tablet (90 mg total) by mouth 2 (two) times daily. 05/05/17  Yes Lyda Jester M, PA-C  valACYclovir (VALTREX) 500 MG tablet Take 1 tablet (500 mg total) by mouth daily. 04/14/17  Yes Arnoldo Morale, MD  Blood Glucose Monitoring Suppl (ACCU-CHEK AVIVA) device Use as instructed daily. 04/14/17   Arnoldo Morale, MD  docusate sodium (COLACE) 100 MG capsule Take 1 capsule (100 mg total) every 12 (twelve) hours by mouth. Patient not taking: Reported on 06/01/2017 05/25/17   Jola Schmidt, MD  glucose blood (ACCU-CHEK AVIVA) test strip Use as instructed daily 04/14/17   Arnoldo Morale, MD  glucose blood (TRUE METRIX BLOOD GLUCOSE TEST) test strip Use as instructed 03/19/17   Barrett, Evelene Croon, PA-C  HYDROcodone-acetaminophen (NORCO/VICODIN) 5-325 MG tablet Take 1 tablet every 4 (four) hours as needed by mouth for moderate pain. Patient not taking: Reported on 06/18/2017 05/24/17   Jola Schmidt, MD  Lancet Devices [facility]) lancets Use as instructed daily. 04/14/17   Arnoldo Morale, MD  levofloxacin (LEVAQUIN) 750 MG tablet Take 1 tablet (750 mg total) by mouth every other day. Patient not taking: Reported on 06/18/2017 06/08/17   Arnoldo Morale, MD    Family History Family History  Problem Relation Age of Onset  . Colon cancer Mother   . Heart attack Other   . Heart attack Maternal Grandmother   . Hypertension Sister   . Hypertension Brother   . Diabetes Paternal Grandmother   . Breast cancer Neg Hx     Social History Social History   Tobacco Use  . Smoking status: Former Smoker    Last attempt to quit: 10/25/1980    Years since quitting: 36.6  . Smokeless tobacco: Never Used  . Tobacco comment: quit smoking in 1982  Substance Use Topics  . Alcohol use: Yes    Comment: 3 times in last year  . Drug use: No    Comment: 08/21/2016 "clean since 05/1998"     Allergies   Shellfish allergy;  Diazepam; and Morphine and related   Review of Systems Review of Systems  Constitutional: Positive for appetite change (She doesn't feel like eating but is drinking fluids.). Negative for chills, diaphoresis and fever.  HENT: Negative.   Respiratory: Positive for shortness of breath (Orthopnea).   Cardiovascular: Positive for chest pain and leg swelling.  Gastrointestinal: Negative.   Genitourinary: Negative.   Musculoskeletal: Negative.   Skin: Negative.   Neurological: Positive for weakness. Negative for syncope and headaches.     Physical Exam Updated Vital Signs BP (!) 155/77   Pulse 91   Temp 98.3 F (36.8 C) (Oral)   Resp 20   Ht _0  (1.727 m)   Wt 133.8 kg (295 lb)   SpO2 99%   BMI 44.85 kg/m   Physical Exam  Constitutional: She is oriented to person, place, and time. She appears well-developed and well-nourished. No distress.  HENT:  Head: Normocephalic.  Mouth/Throat: Mucous membranes are dry.  Neck: Normal range of motion. Neck supple.  Cardiovascular: Normal rate, regular rhythm and intact distal pulses.  Pulmonary/Chest: Effort normal and breath sounds normal. She has no wheezes. She has no rales. She exhibits no tenderness.  Abdominal: Soft. Bowel sounds are normal. There is no tenderness. There is no rebound and no guarding.  Musculoskeletal: Normal range of motion. She exhibits edema.  Left thigh has a large mass with healing incisional scar to center. No fluctuance. No active drainage. No erythema.   Neurological: She is alert and oriented to person, place, and time.  Skin: Skin is warm and dry. No rash noted.  Psychiatric: She has a normal mood and affect.     ED Treatments / Results  Labs (all labs ordered are listed, but only abnormal results are displayed) Labs Reviewed  BASIC METABOLIC PANEL - Abnormal; Notable for the  following components:      Result Value   Sodium 134 (*)    Potassium 3.4 (*)    Chloride 98 (*)    Glucose, Bld 217 (*)     BUN 59 (*)    Creatinine, Ser 2.35 (*)    Calcium 8.8 (*)    GFR calc non Af Amer 22 (*)    GFR calc Af Amer 25 (*)    All other components within normal limits  CBC - Abnormal; Notable for the following components:   RBC 3.34 (*)    Hemoglobin 9.4 (*)    HCT 29.0 (*)    All other components within normal limits  BRAIN NATRIURETIC PEPTIDE  URINALYSIS, ROUTINE W REFLEX MICROSCOPIC  I-STAT TROPONIN, ED  I-STAT BETA HCG BLOOD, ED ([facility], [facility], [facility] ONLY)    EKG  EKG Interpretation  Date/Time:  Thursday June 17 2017 20:50:08 EST Ventricular Rate:  89 PR Interval:    QRS Duration: 92 QT Interval:  415 QTC Calculation: 505 R Axis:   54 Text Interpretation:  Sinus rhythm Borderline T wave abnormalities Borderline prolonged QT interval No significant change since last tracing Confirmed by Pryor Curia [id number]) on 06/18/2017 2:19:10 AM       Radiology Dg Chest 2 View  Result Date: 06/17/2017 CLINICAL DATA:  61 year old female with chest pain. EXAM: CHEST  2 VIEW COMPARISON:  Chest radiograph dated 04/30/2017 FINDINGS: The heart size and mediastinal contours are within normal limits. Both lungs are clear. The visualized skeletal structures are unremarkable. IMPRESSION: No active cardiopulmonary disease. Electronically Signed   By: Anner Crete M.D.   On: 06/17/2017 21:42    Procedures Procedures (including critical care time)  Medications Ordered in ED Medications  sodium chloride 0.9 % bolus 500 mL (500 mLs Intravenous New Bag/Given 06/18/17 0228)     Initial Impression / Assessment and Plan / ED Course  I have reviewed the triage vital signs and the nursing notes.  Pertinent labs & imaging results that were available during my care of the patient were reviewed by me and considered in my medical decision making (see chart for details).     Patient to ED with multiple complaints, most notably chest pain, orthopnea, and generalized weakness.   VSS. Initial lab studies,  including troponin, are reassuring and essentially at baseline. She has a history of CHF but currently has a clear chest x-Miske without evidence of edema or infection. There has been no hypoxia while being observed here.  BNP is 9.5.   Her chest pain is persistent/constant, atypical for ACS. EKG without acute change. Troponin negative.   She is hyperglycemic without evidence of acidosis. Cr. And BUN are baseline for known kidney disease.   Abscess to left thigh is large but without fluctuance. She was just rechecked by her surgeon yesterday prior to coming to the emergency department. They did not recommend change in therapy or repeat I&D. No further treatment here was found necessary to this abscess. No evidence of cellulitic changes. No leukocytosis. No fever.   She is given a small bolus to address her "feeling dehydrated" and she reports she feels some better. She is concerned regarding persistent pain. A Percocet was ordered.   Feel she can be discharged home.  Final Clinical Impressions(s) / ED Diagnoses   Final diagnoses:  None   1. Generalized weakness 2. Nonspecific chest pain 3. Mild orthopnea 4. Left thigh wound 5. Hyperglycemia  ED Discharge Orders    None  Charlann Lange, PA-C 06/18/17 1856    Ward, Delice Bison, DO 06/18/17 [phone]

## 2017-06-23 ENCOUNTER — Telehealth: Payer: Self-pay | Admitting: Family Medicine

## 2017-06-23 NOTE — Telephone Encounter (Signed)
Pt called since she went to the Ed since her system got worse and they can't find what is wrong with her, she want to talk to the nurse or the provider

## 2017-06-24 ENCOUNTER — Telehealth: Payer: Self-pay

## 2017-06-24 MED FILL — GABAPENTIN 100 MG CAPSULE: 100 | 30 days supply | Qty: 180 | Fill #1

## 2017-06-24 NOTE — Telephone Encounter (Signed)
Pt was called and states that she is feeling dehydrated, weak, and nausea. Pt went to ED and they discharged patient. Please follow up.

## 2017-06-24 NOTE — Telephone Encounter (Signed)
Call received from [facility] with [facility] # [phone]. She was inquiring about the status of the chronic disease form that she faxed to [facility] for tele-monitoring for the patient. If the provider feels that the patient would benefit from the service, the completed form can be faxed back to her  - fax # [phone].

## 2017-06-25 MED FILL — ACCU-CHEK AVIVA PLUS TEST S: 25 days supply | Qty: 100 | Fill #1

## 2017-06-25 NOTE — Telephone Encounter (Signed)
She is followed by Cardiology and I would suggest they communicate with them regarding that. Thanks.

## 2017-06-25 NOTE — Telephone Encounter (Signed)
On further questioning she states she is weak and fatigued but nausea has resolved.  Labs reviewed reveal anemia which has improved;advised to increase oral hydration and rest, continue ferrous sulfate

## 2017-06-25 NOTE — Telephone Encounter (Signed)
Pt was called and she states that her blood sugars have been 150,164,176,209,117,143 from 12 -14/18-12/11/18)

## 2017-06-25 NOTE — Telephone Encounter (Signed)
Please obtain her blood sugar readings - fasting and random. Thanks.

## 2017-06-25 NOTE — Telephone Encounter (Signed)
Call placed to [facility] with [facility].  Informed her that the patient is followed by Cardiology and Dr Jarold Song suggests that [facility] follow up with them.  Provided [name] with the phone # for [facility] # [phone].

## 2017-06-30 ENCOUNTER — Other Ambulatory Visit: Payer: Self-pay | Admitting: Pharmacist

## 2017-06-30 MED ORDER — MITIGARE 0.6 MG PO CAPS: 1.0000 | ORAL_CAPSULE | Freq: Every day | ORAL | 0 refills | Status: DC

## 2017-06-30 MED FILL — FERROUS SULFATE 325 MG TAB: 325 (65 FE) | 30 days supply | Qty: 60 | Fill #2

## 2017-06-30 MED FILL — MITIGARE 0.6 MG CAPSULE: 0.6 | 30 days supply | Qty: 30 | Fill #0

## 2017-06-30 MED FILL — OMEPRAZOLE DR 20 MG CAPSULE: 20 | 30 days supply | Qty: 30 | Fill #2

## 2017-06-30 MED FILL — ISOSORBIDE MN ER 60 MG TAB: 60 | 30 days supply | Qty: 30 | Fill #2

## 2017-06-30 MED FILL — AMLODIPINE BESYLATE 10 MG T: 10 | 30 days supply | Qty: 30 | Fill #2

## 2017-06-30 MED FILL — ATORVASTATIN 80 MG TABLET: 80 | 30 days supply | Qty: 30 | Fill #2

## 2017-07-05 IMAGING — CR DG CHEST 1V PORT
1 series · 1 of 1 positions shown · non-contrast
Comparison: 02/15/2015

CLINICAL DATA: Chest pain with shortness of breath for 2 weeks.

EXAM:
PORTABLE CHEST - 1 VIEW

[AP]
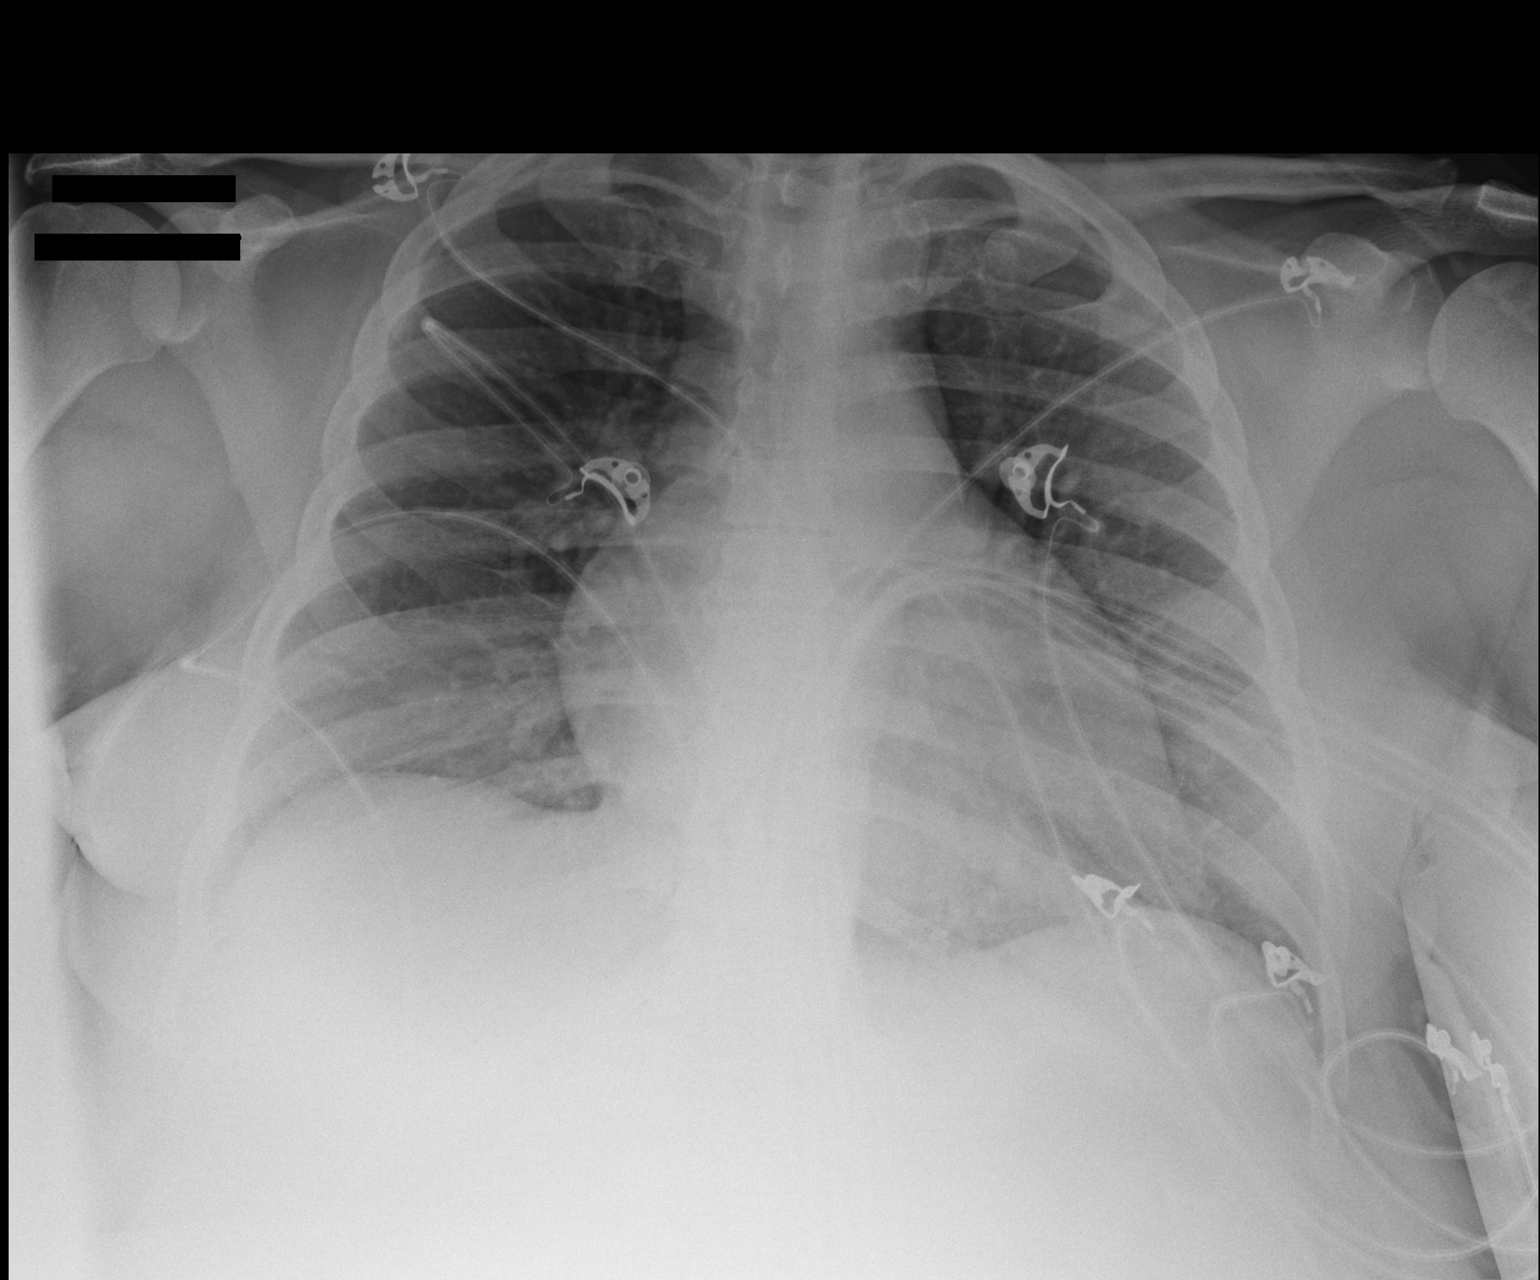

[1 of 1 positions shown; findings below may reference images not displayed]

FINDINGS: Normal heart size and pulmonary vascularity. Shallow inspiration.
Probable atelectasis in the lung bases. Soft tissue attenuation also
over the lower lungs. No focal consolidation. No blunting of
costophrenic angles. No pneumothorax. Mediastinal contours appear
intact.
IMPRESSION: Shallow inspiration with probable atelectasis in the lung bases.

## 2017-07-06 IMAGING — CR DG CHEST 2V
2 series · 2 of 2 positions shown · non-contrast
Comparison: Portable chest x-ray March 18, 2015

CLINICAL DATA: History of asthma, acute respiratory failure,
diabetes, CHF.

EXAM:
CHEST  2 VIEW

[chest pa]
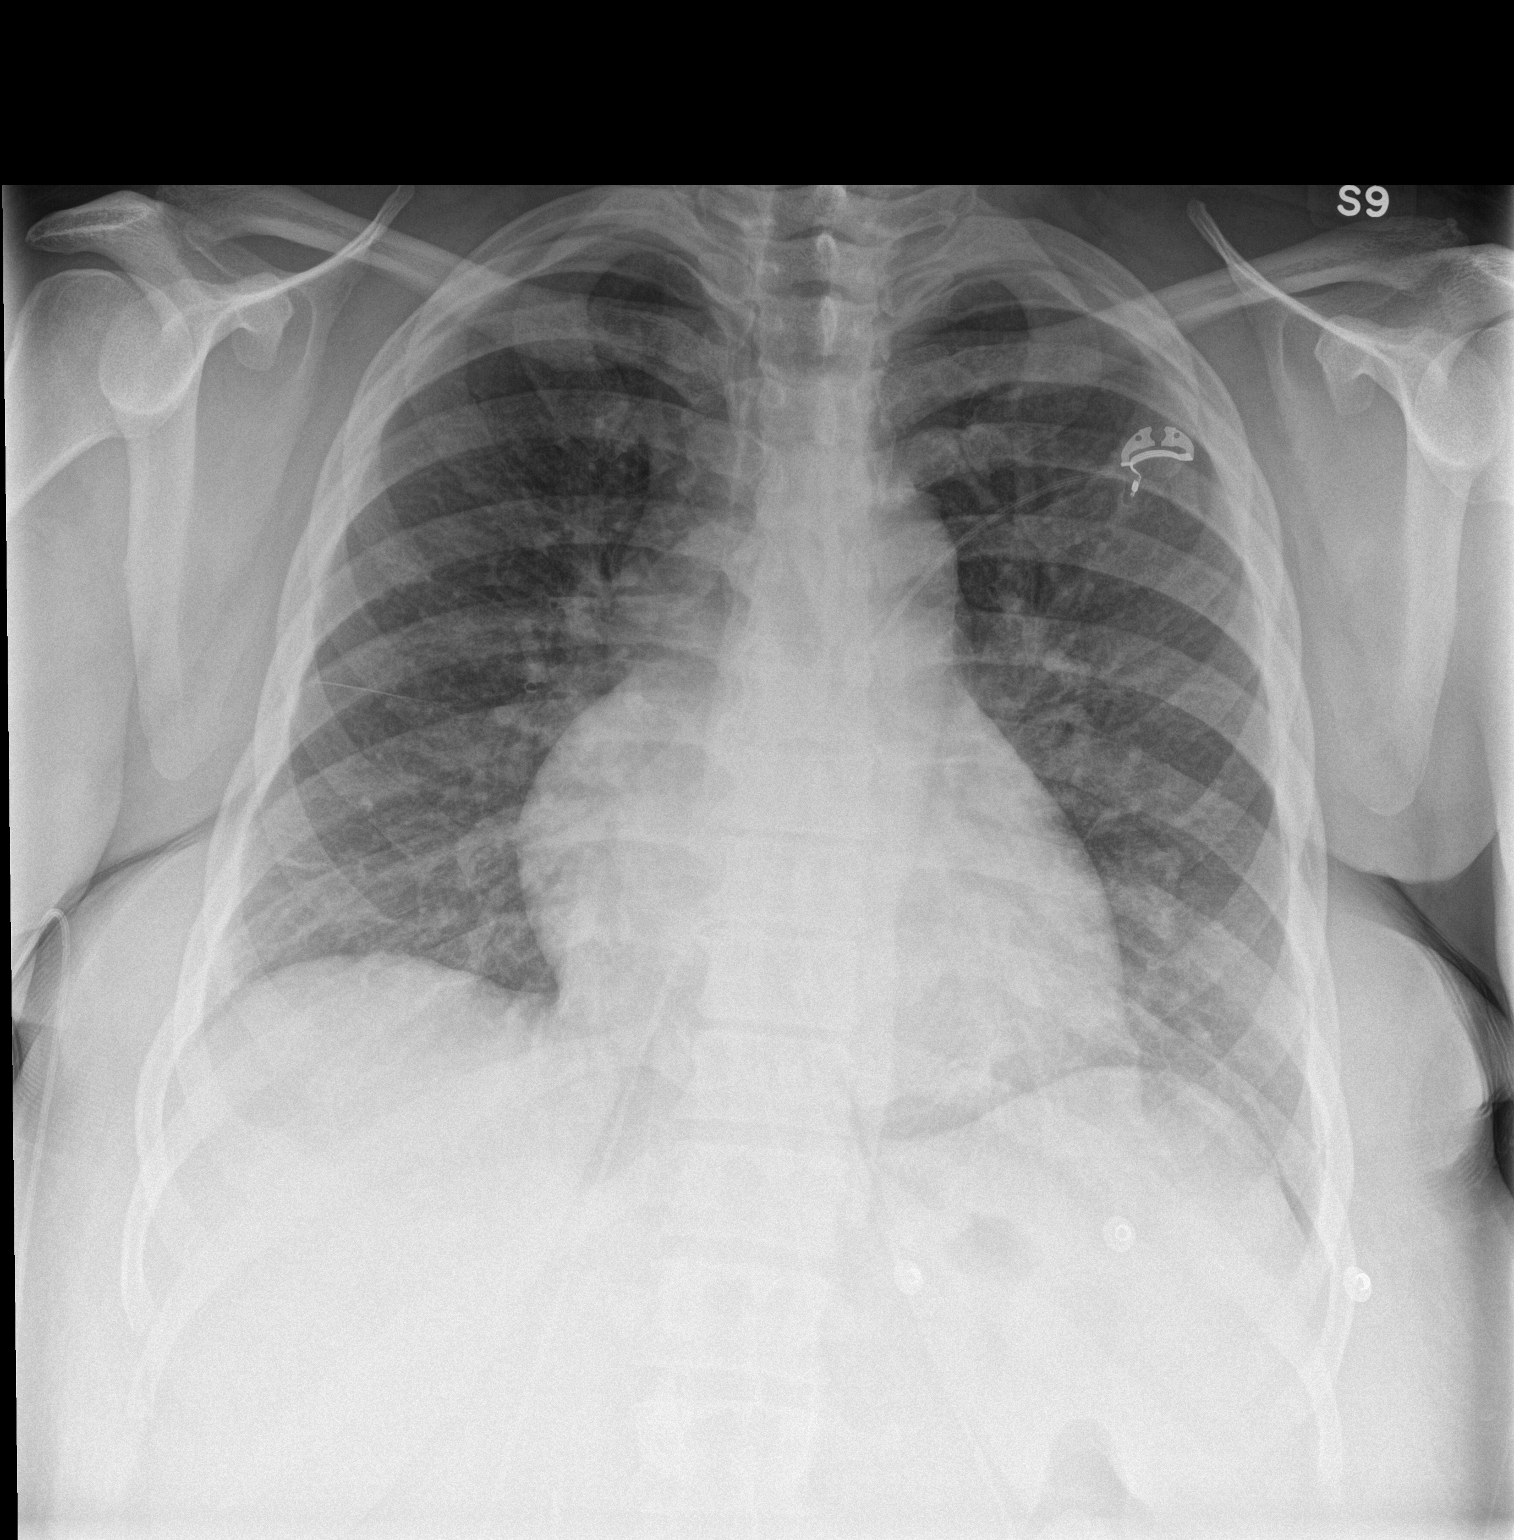

[chest lat]
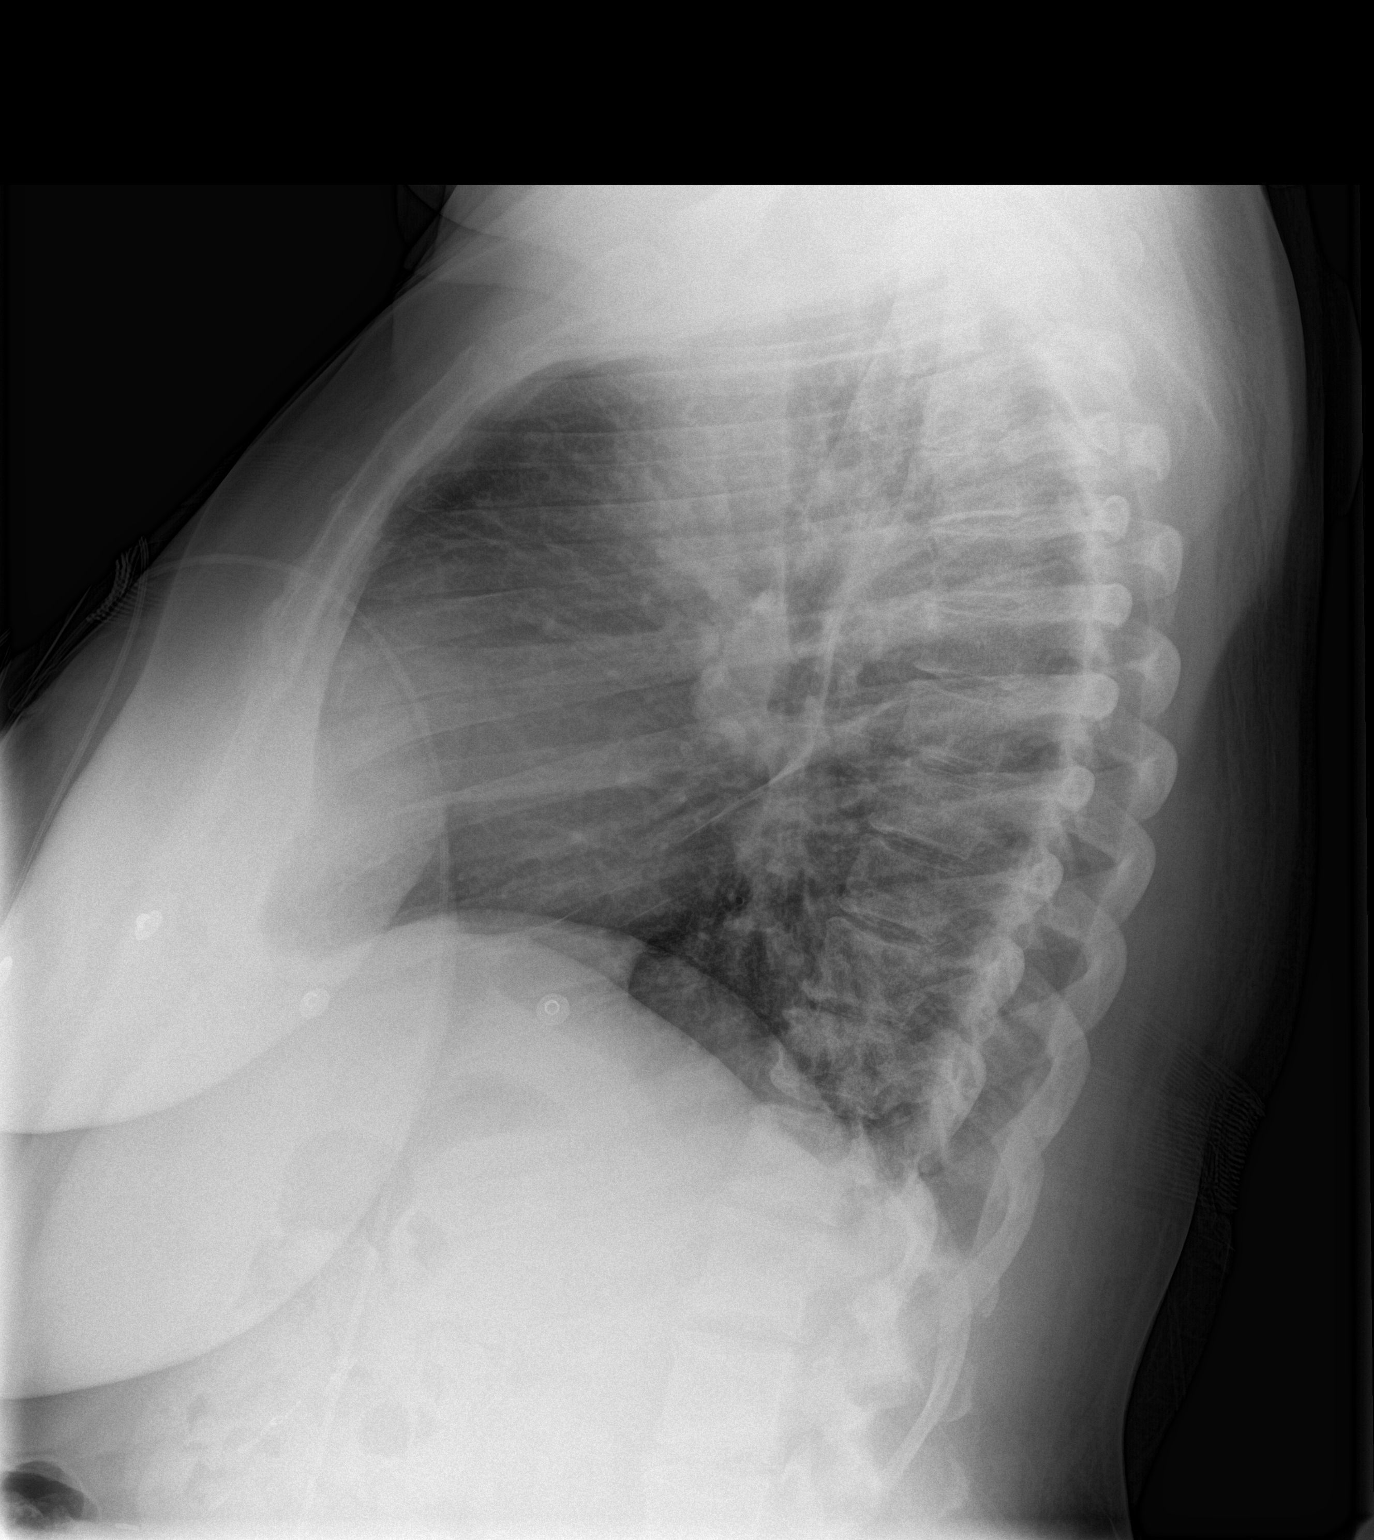

[2 of 2 positions shown; findings below may reference images not displayed]

FINDINGS: The lungs are well-expanded. The pulmonary interstitial markings are
more conspicuous today. The cardiac silhouette remains enlarged. The
central pulmonary vascularity is mildly engorged. The mediastinum is
normal in width. There is no significant pleural effusion. The bony
thorax is unremarkable.
IMPRESSION: Mild pulmonary interstitial edema secondary to CHF. There is no
alveolar pneumonia.

## 2017-07-15 ENCOUNTER — Encounter: Payer: Self-pay | Admitting: Physician Assistant

## 2017-07-15 ENCOUNTER — Ambulatory Visit (INDEPENDENT_AMBULATORY_CARE_PROVIDER_SITE_OTHER): Payer: Medicaid Other | Admitting: Physician Assistant

## 2017-07-15 ENCOUNTER — Other Ambulatory Visit: Payer: Self-pay

## 2017-07-15 ENCOUNTER — Encounter (HOSPITAL_COMMUNITY): Payer: Self-pay | Admitting: *Deleted

## 2017-07-15 ENCOUNTER — Emergency Department (HOSPITAL_COMMUNITY): Payer: Medicaid Other

## 2017-07-15 ENCOUNTER — Ambulatory Visit: Payer: Medicaid Other | Admitting: Podiatry

## 2017-07-15 ENCOUNTER — Emergency Department
Admission: EM | Admit: 2017-07-15 | Discharge: 2017-07-15 | Discharge status: Against Medical Advice | Payer: Medicaid Other

## 2017-07-15 ENCOUNTER — Emergency Department (HOSPITAL_COMMUNITY)
Admission: EM | Admit: 2017-07-15 | Discharge: 2017-07-15 | Disposition: A | Discharge status: Home / Self Care | Payer: Medicaid Other | Attending: Emergency Medicine | Admitting: Emergency Medicine

## 2017-07-15 VITALS — BP 138/70 | HR 81 | Temp 97.6°F | Resp 19 | Wt 323.0 lb

## 2017-07-15 DIAGNOSIS — I130 Hypertensive heart and chronic kidney disease with heart failure and stage 1 through stage 4 chronic kidney disease, or unspecified chronic kidney disease: Secondary | ICD-10-CM | POA: Insufficient documentation

## 2017-07-15 DIAGNOSIS — Z955 Presence of coronary angioplasty implant and graft: Secondary | ICD-10-CM | POA: Diagnosis not present

## 2017-07-15 DIAGNOSIS — I5043 Acute on chronic combined systolic (congestive) and diastolic (congestive) heart failure: Secondary | ICD-10-CM

## 2017-07-15 DIAGNOSIS — E1122 Type 2 diabetes mellitus with diabetic chronic kidney disease: Secondary | ICD-10-CM | POA: Diagnosis not present

## 2017-07-15 DIAGNOSIS — I2510 Atherosclerotic heart disease of native coronary artery without angina pectoris: Secondary | ICD-10-CM | POA: Insufficient documentation

## 2017-07-15 DIAGNOSIS — Z7982 Long term (current) use of aspirin: Secondary | ICD-10-CM | POA: Insufficient documentation

## 2017-07-15 DIAGNOSIS — I509 Heart failure, unspecified: Secondary | ICD-10-CM

## 2017-07-15 DIAGNOSIS — Z794 Long term (current) use of insulin: Secondary | ICD-10-CM | POA: Insufficient documentation

## 2017-07-15 DIAGNOSIS — Z87891 Personal history of nicotine dependence: Secondary | ICD-10-CM | POA: Insufficient documentation

## 2017-07-15 DIAGNOSIS — R0602 Shortness of breath: Secondary | ICD-10-CM | POA: Diagnosis present

## 2017-07-15 DIAGNOSIS — Z79899 Other long term (current) drug therapy: Secondary | ICD-10-CM | POA: Insufficient documentation

## 2017-07-15 DIAGNOSIS — N184 Chronic kidney disease, stage 4 (severe): Secondary | ICD-10-CM | POA: Diagnosis not present

## 2017-07-15 DIAGNOSIS — Z7689 Persons encountering health services in other specified circumstances: Secondary | ICD-10-CM | POA: Diagnosis not present

## 2017-07-15 LAB — BASIC METABOLIC PANEL WITH GFR
Anion gap: 10 (ref 5–15)
BUN: 66 mg/dL — ABNORMAL HIGH (ref 6–20)
CO2: 22 mmol/L (ref 22–32)
Calcium: 8.4 mg/dL — ABNORMAL LOW (ref 8.9–10.3)
Chloride: 108 mmol/L (ref 101–111)
Creatinine, Ser: 2.18 mg/dL — ABNORMAL HIGH (ref 0.44–1.00)
GFR calc Af Amer: 27 mL/min — ABNORMAL LOW
GFR calc non Af Amer: 24 mL/min — ABNORMAL LOW
Glucose, Bld: 302 mg/dL — ABNORMAL HIGH (ref 65–99)
Potassium: 4.1 mmol/L (ref 3.5–5.1)
Sodium: 140 mmol/L (ref 135–145)

## 2017-07-15 LAB — CBC
HCT: 31.6 % — ABNORMAL LOW (ref 36.0–46.0)
Hemoglobin: 10.1 g/dL — ABNORMAL LOW (ref 12.0–15.0)
MCH: 28.3 pg (ref 26.0–34.0)
MCHC: 32 g/dL (ref 30.0–36.0)
MCV: 88.5 fL (ref 78.0–100.0)
Platelets: 214 K/uL (ref 150–400)
RBC: 3.57 MIL/uL — ABNORMAL LOW (ref 3.87–5.11)
RDW: 16.3 % — ABNORMAL HIGH (ref 11.5–15.5)
WBC: 5.6 K/uL (ref 4.0–10.5)

## 2017-07-15 LAB — I-STAT BETA HCG BLOOD, ED (MC, WL, AP ONLY): I-stat hCG, quantitative: <5 m[IU]/mL

## 2017-07-15 LAB — BRAIN NATRIURETIC PEPTIDE: B Natriuretic Peptide: 110.1 pg/mL — ABNORMAL HIGH (ref 0.0–100.0)

## 2017-07-15 LAB — I-STAT TROPONIN, ED: Troponin i, poc: 0.01 ng/mL (ref 0.00–0.08)

## 2017-07-15 MED ORDER — SODIUM CHLORIDE 0.9 % IV SOLN
INTRAVENOUS | Status: DC
  Administered 2017-07-15: 18:00:00 via INTRAVENOUS

## 2017-07-15 MED ORDER — FUROSEMIDE 10 MG/ML IJ SOLN
80.0000 mg | Freq: Once | INTRAMUSCULAR | Status: AC
  Administered 2017-07-15: 80 mg via INTRAVENOUS
  Filled 2017-07-15: qty 8

## 2017-07-15 NOTE — ED Notes (Signed)
Patient states that she would no longer like to be seen here and she will be going to .  Patient instructed that she should really try and stay to be triaged due to her PA calling us and informing us she needed to be seen.  Sonia Baller, [place] called the hospital and informed us that the patient has had 30 lb weight gain since December and is a CHF patient with weakness and shortness of breath.  Patient refused to be triaged at this time.

## 2017-07-15 NOTE — ED Provider Notes (Signed)
[facility] EMERGENCY DEPARTMENT Provider Note   CSN: [id number] Arrival date & time: 07/15/17  1201     History   Chief Complaint Chief Complaint  Patient presents with  . Shortness of Breath    HPI   Blood pressure (!) 154/73, pulse 77, temperature 97.6 F (36.4 C), temperature source Oral, resp. rate 18, SpO2 99 %.  [name] is a 62 y.o. female complaining of redness of breath, dyspnea on exertion, orthopnea, PND and increasing peripheral edema worsening over the course of the last several weeks.  She has a 30 pound weight gain as per her PA who called and report prior to ED presentation.  She denies any active chest pain, she had an episode of chest pain on New Year's occurring while at rest, she not take any nitroglycerin because she thought it was acid reflux, it resolved spontaneously.  She denies fevers and chills but endorses difficulty completing her ADLs due to shortness of breath.  She states that she is wheezing and has a dry cough as well.  She denies any history of DVT/PE, recent immobilizations, palpitations, hemoptysis, fever.  Noncompliant with her low-sodium diet because her friend is visiting who enjoys eating pork.  She has been compliant with her 80 mg of Lasix.  She does not weigh herself regularly.  She states that she cannot find her scale.  Past Medical History:  Diagnosis Date  . Aortic stenosis    Echo 8/18: mean 13, peak 28, LVOT/AV mean velocity 0.51  . Asthma    As a child   . Bronchitis   . CAD (coronary artery disease)    a. 09/2016: 50% Ost 1st Mrg stenosis, 50% 2nd Mrg stenosis, 20% Mid-Cx, 95% Prox LAD, 40% mid-LAD, and 10% dist-LAD stenosis. Staged PCI with DES to Prox-LAD.   [name] Chronic combined systolic and diastolic CHF (congestive heart failure) ([place]) 2011   echo 2/18: EF 55-60, normal wall motion, grade 2 diastolic dysfunction, trivial AI // echo 3/18: Septal and apical HK, EF 45-50, normal wall motion, trivial AI, mild  LAE, PASP 38 // echo 8/18: EF 60-65, normal wall motion, grade 1 diastolic dysfunction, calcified aortic valve leaflets, mild aortic stenosis (mean 13, peak 28, LVOT/AV mean velocity 0.51), mild AI, moderate MAC, mild LAE, trivial TR   . Complication of anesthesia   . Diabetes mellitus Dx 1989  . Hepatitis C Dx 2013  . Hypertension Dx 1989  . Obesity   . Pancreatitis 2013  . Refusal of blood transfusions as patient is Jehovah's Witness   . Tendinitis   . Ulcer 2010    Patient Active Problem List   Diagnosis Date Noted  . Abscess of left thigh 06/02/2017  . Acute metabolic encephalopathy 22/33/6122  . Chest pain 04/29/2017  . Chest pain with moderate risk of acute coronary syndrome 04/28/2017  . Acute on chronic combined systolic and diastolic CHF, NYHA class 4 ([place]) 03/19/2017  . Unstable angina ([place]) 03/17/2017  . Aortic stenosis   . Herpes simplex infection of genitourinary system 02/12/2017  . Chronic pain of right knee 02/12/2017  . Chronic gout due to renal impairment of multiple sites without tophus 02/12/2017  . Esophageal dysphagia 02/12/2017  . Osteoarthritis 01/22/2017  . Diabetic hyperosmolar non-ketotic state ([place]) 12/13/2016  . CAD (coronary artery disease) 12/13/2016  . Hyperlipidemia 12/13/2016  . Hyperphosphatemia 12/13/2016  . History of non-ST elevation myocardial infarction (NSTEMI) 09/13/2016  . Acute myopericarditis   . Injury of left hand  02/24/2016  . Asthma 11/29/2015  . Trigger middle finger 11/25/2015  . Depression 09/05/2015  . GERD (gastroesophageal reflux disease) 03/25/2015  . Precordial pain   . Environmental allergies 03/14/2015  . Hep C w/o coma, chronic ([place]) 01/31/2015  . Diabetic neuropathy ([place]) 01/31/2015  . OSA (obstructive sleep apnea) 01/01/2015  . Poor dentition 11/13/2014  . Essential hypertension 10/08/2014  . Obesity 10/08/2014  . CKD (chronic kidney disease) stage 4, GFR 15-29 ml/min ([facility]) 10/08/2014  . Diabetes mellitus type  2, uncontrolled, with complications ([place]) 96/29/5284    Class: Chronic  . Anemia in stage 3 chronic kidney disease ([place]) 03/25/2012  . Chronic combined systolic and diastolic CHF (congestive heart failure) ([place]) 07/13/2009    Past Surgical History:  Procedure Laterality Date  . CHOLECYSTECTOMY    . CORONARY STENT INTERVENTION N/A 09/18/2016   Procedure: Coronary Stent Intervention;  Surgeon: Troy Sine, MD;  Location: [facility];  Service: Cardiovascular;  Laterality: N/A;  . EYE SURGERY    . KNEE ARTHROSCOPY    . LEFT HEART CATH N/A 09/18/2016   Procedure: Left Heart Cath;  Surgeon: Troy Sine, MD;  Location: [facility];  Service: Cardiovascular;  Laterality: N/A;  . LEFT HEART CATH AND CORONARY ANGIOGRAPHY N/A 09/16/2016   Procedure: Left Heart Cath and Coronary Angiography;  Surgeon: Burnell Blanks, MD;  Location: [facility];  Service: Cardiovascular;  Laterality: N/A;  . LEFT HEART CATH AND CORONARY ANGIOGRAPHY N/A 04/29/2017   Procedure: LEFT HEART CATH AND CORONARY ANGIOGRAPHY;  Surgeon: Nelva Bush, MD;  Location: [facility];  Service: Cardiovascular;  Laterality: N/A;  . TUBAL LIGATION    . TUBAL LIGATION      OB History    No data available       Home Medications    Prior to Admission medications   Medication Sig Start Date End Date Taking? Authorizing Provider  acetaminophen-codeine (TYLENOL #3) 300-30 MG tablet Take 1 tablet by mouth every 12 (twelve) hours as needed for severe pain. For osteoarthritis (unable to take NSAIDs due to CKD) 04/14/17   Arnoldo Morale, MD  albuterol (PROVENTIL) (2.5 MG/3ML) 0.083% nebulizer solution Take 3 mLs (2.5 mg total) by nebulization every 6 (six) hours as needed for wheezing or shortness of breath. 12/17/16   Argentina Donovan, PA-C  albuterol (VENTOLIN HFA) 108 (90 Base) MCG/ACT inhaler Inhale 2 puffs into the lungs every 4 (four) hours as needed for wheezing or shortness of breath. 08/03/16    Boykin Nearing, MD  allopurinol (ZYLOPRIM) 100 MG tablet Take 1 tablet (100 mg total) by mouth daily. 02/12/17   Funches, Adriana Mccallum, MD  amLODipine (NORVASC) 10 MG tablet Take 1 tablet (10 mg total) by mouth daily. 04/14/17   Arnoldo Morale, MD  aspirin EC 81 MG EC tablet Take 1 tablet (81 mg total) by mouth daily. 09/19/16   Strader, Fransisco Hertz, PA-C  atorvastatin (LIPITOR) 80 MG tablet Take 1 tablet (80 mg total) by mouth daily at 6 PM. 02/12/17   Boykin Nearing, MD  Blood Glucose Monitoring Suppl (ACCU-CHEK AVIVA) device Use as instructed daily. 04/14/17   Arnoldo Morale, MD  carvedilol (COREG) 25 MG tablet TAKE 1 TABLET BY MOUTH 2 TIMES DAILY WITH A MEAL. 02/12/17   Funches, Adriana Mccallum, MD  docusate sodium (COLACE) 100 MG capsule Take 1 capsule (100 mg total) every 12 (twelve) hours by mouth. Patient not taking: Reported on 06/01/2017 05/25/17   Jola Schmidt, MD  exenatide (BYETTA 10 MCG PEN) 10 MCG/0.04ML SOPN injection Inject 0.04 mLs (10 mcg total) into the skin 2 (two) times daily with a meal. 04/30/17   Arnoldo Morale, MD  ferrous sulfate 325 (65 FE) MG tablet Take 1 tablet (325 mg total) by mouth 2 (two) times daily with a meal. 04/14/17   Arnoldo Morale, MD  fluticasone (FLONASE) 50 MCG/ACT nasal spray Place 2 sprays into both nostrils daily as needed for allergies or rhinitis. 01/07/17   Funches, Adriana Mccallum, MD  Fluticasone-Salmeterol (ADVAIR DISKUS) 250-50 MCG/DOSE AEPB Inhale 1 puff into the lungs 2 (two) times daily. Patient taking differently: Inhale 1 puff into the lungs 2 (two) times daily as needed (shortness of breath).  12/17/16   Argentina Donovan, PA-C  furosemide (LASIX) 40 MG tablet Take 1 tablet (40 mg total) by mouth daily. 05/12/17 05/12/18  Lyda Jester M, PA-C  gabapentin (NEURONTIN) 100 MG capsule TAKE 2 CAPSULES BY MOUTH 3 TIMES A DAY. Patient taking differently: Take 200 mg by mouth 3 (three) times daily.  05/10/17   Arnoldo Morale, MD  glucose blood (ACCU-CHEK AVIVA) test strip  Use as instructed daily 04/14/17   Arnoldo Morale, MD  glucose blood (TRUE METRIX BLOOD GLUCOSE TEST) test strip Use as instructed 03/19/17   Barrett, Evelene Croon, PA-C  hydrALAZINE (APRESOLINE) 50 MG tablet Take 1 tablet (50 mg total) by mouth 3 (three) times daily. 02/12/17   Funches, Adriana Mccallum, MD  HYDROcodone-acetaminophen (NORCO/VICODIN) 5-325 MG tablet Take 1 tablet every 4 (four) hours as needed by mouth for moderate pain. Patient not taking: Reported on 06/18/2017 05/24/17   Jola Schmidt, MD  hydrOXYzine (ATARAX/VISTARIL) 50 MG tablet TAKE 1 TABLET (50 MG TOTAL) BY MOUTH 3 (THREE) TIMES DAILY AS NEEDED. Patient taking differently: Take 50 mg by mouth 3 (three) times daily as needed for itching.  03/02/17   Tresa Garter, MD  insulin aspart (NOVOLOG) 100 UNIT/ML injection 0-12 units subcutaneously 3 times daily before meals according to sliding scale 04/14/17   Arnoldo Morale, MD  Insulin Glargine (LANTUS SOLOSTAR) 100 UNIT/ML Solostar Pen Inject 40 Units into the skin 2 (two) times daily. Patient taking differently: Inject 30 Units into the skin 2 (two) times daily.  04/14/17   Arnoldo Morale, MD  isosorbide mononitrate (IMDUR) 60 MG 24 hr tablet Take 1 tablet (60 mg total) by mouth daily. 05/05/17   Lyda Jester M, PA-C  ketotifen (ZADITOR) 0.025 % ophthalmic solution Place 1 drop into both eyes daily as needed (for dry eyes). 07/27/16   Boykin Nearing, MD  Lancet Devices [facility]) lancets Use as instructed daily. 04/14/17   Arnoldo Morale, MD  levofloxacin (LEVAQUIN) 750 MG tablet Take 1 tablet (750 mg total) by mouth every other day. Patient not taking: Reported on 06/18/2017 06/08/17   Arnoldo Morale, MD  MITIGARE 0.6 MG CAPS Take 1 capsule by mouth daily. 06/30/17   Arnoldo Morale, MD  nitroGLYCERIN (NITROSTAT) 0.4 MG SL tablet Place 1 tablet (0.4 mg total) under the tongue every 5 (five) minutes x 3 doses as needed for chest pain. 03/19/17   Barrett, Evelene Croon, PA-C  omeprazole  (PRILOSEC) 20 MG capsule Take 1 capsule (20 mg total) by mouth daily. 04/14/17   Arnoldo Morale, MD  oxyCODONE-acetaminophen (PERCOCET/ROXICET) 5-325 MG tablet Take 1 tablet by mouth every 6 (six) hours as needed for severe pain. 06/18/17   Charlann Lange, PA-C  PEDIALYTE (PEDIALYTE) SOLN Take 240 mLs by mouth every 6 (six) hours.     [provider]  pregabalin (LYRICA) 100 MG capsule Take 1 capsule (100 mg total) by mouth 3 (three) times daily. 05/03/17   Arnoldo Morale, MD  ticagrelor (BRILINTA) 90 MG TABS tablet Take 1 tablet (90 mg total) by mouth 2 (two) times daily. 05/05/17   Lyda Jester M, PA-C  valACYclovir (VALTREX) 500 MG tablet Take 1 tablet (500 mg total) by mouth daily. 04/14/17   Arnoldo Morale, MD    Family History Family History  Problem Relation Age of Onset  . Colon cancer Mother   . Heart attack Other   . Heart attack Maternal Grandmother   . Hypertension Sister   . Hypertension Brother   . Diabetes Paternal Grandmother   . Breast cancer Neg Hx     Social History Social History   Tobacco Use  . Smoking status: Former Smoker    Last attempt to quit: 10/25/1980    Years since quitting: 36.7  . Smokeless tobacco: Never Used  . Tobacco comment: quit smoking in 1982  Substance Use Topics  . Alcohol use: Yes    Comment: 3 times in last year  . Drug use: No    Comment: 08/21/2016 "clean since 05/1998"     Allergies   Shellfish allergy; Diazepam; and Morphine and related   Review of Systems Review of Systems  A complete review of systems was obtained and all systems are negative except as noted in the HPI and PMH.   Physical Exam Updated Vital Signs BP (!) 170/88   Pulse 81   Temp 97.6 F (36.4 C) (Oral)   Resp (!) 25   SpO2 99%   Physical Exam  Constitutional: She is oriented to person, place, and time. She appears well-developed and well-nourished. No distress.  HENT:  Head: Normocephalic and atraumatic.  Mouth/Throat: Oropharynx is  clear and moist.  Eyes: Conjunctivae and EOM are normal. Pupils are equal, round, and reactive to light.  Neck: Normal range of motion.  Cardiovascular: Normal rate, regular rhythm and intact distal pulses.  Pulmonary/Chest: Effort normal and breath sounds normal. No stridor. No respiratory distress. She has no wheezes. She has no rales. She exhibits no tenderness.  Abdominal: Soft. There is no tenderness.  Musculoskeletal: Normal range of motion. She exhibits edema.  3+ pitting edema to midshin bilaterally  Neurological: She is alert and oriented to person, place, and time.  Skin: She is not diaphoretic.  Psychiatric: She has a normal mood and affect.  Nursing note and vitals reviewed.    ED Treatments / Results  Labs (all labs ordered are listed, but only abnormal results are displayed) Labs Reviewed  BASIC METABOLIC PANEL - Abnormal; Notable for the following components:      Result Value   Glucose, Bld 302 (*)    BUN 66 (*)    Creatinine, Ser 2.18 (*)    Calcium 8.4 (*)    GFR calc non Af Amer 24 (*)    GFR calc Af Amer 27 (*)    All other components within normal limits  CBC - Abnormal; Notable for the following components:   RBC 3.57 (*)    Hemoglobin 10.1 (*)    HCT 31.6 (*)    RDW 16.3 (*)    All other components within normal limits  BRAIN NATRIURETIC PEPTIDE - Abnormal; Notable for the following components:   B Natriuretic Peptide 110.1 (*)    All other components within normal limits  I-STAT TROPONIN, ED  I-STAT BETA HCG BLOOD, ED ([facility], [facility],  [facility] ONLY)    EKG  EKG Interpretation  Date/Time:  Thursday July 15 2017 12:07:39 EST Ventricular Rate:  82 PR Interval:  134 QRS Duration: 90 QT Interval:  448 QTC Calculation: 523 R Axis:   58 Text Interpretation:  Normal sinus rhythm Nonspecific T wave abnormality Prolonged QT Abnormal ECG no significant change since Dec 2018 Confirmed by Sherwood Gambler [phone]) on 07/15/2017 5:06:54 PM       Radiology Dg Chest  2 View  Result Date: 07/15/2017 CLINICAL DATA:  Shortness of breath, weakness EXAM: CHEST  2 VIEW COMPARISON:  None. FINDINGS: The heart size and mediastinal contours are within normal limits. Both lungs are clear. The visualized skeletal structures are unremarkable. IMPRESSION: No active cardiopulmonary disease. Electronically Signed   By: Kathreen Devoid   On: 07/15/2017 13:03    Procedures Procedures (including critical care time)  Medications Ordered in ED Medications  0.9 %  sodium chloride infusion ( Intravenous New Bag/Given 07/15/17 1811)  furosemide (LASIX) injection 80 mg (80 mg Intravenous Given 07/15/17 1812)     Initial Impression / Assessment and Plan / ED Course  I have reviewed the triage vital signs and the nursing notes.  Pertinent labs & imaging results that were available during my care of the patient were reviewed by me and considered in my medical decision making (see chart for details).     Vitals:   07/15/17 1845 07/15/17 1900 07/15/17 1915 07/15/17 2000  BP: (!) 157/81 (!) 156/77 (!) 156/83 (!) 170/88  Pulse: 81 85 82 81  Resp: _0 (!) 25  Temp:      TempSrc:      SpO2: 99% 99% 98% 99%    Medications  0.9 %  sodium chloride infusion ( Intravenous New Bag/Given 07/15/17 1811)  furosemide (LASIX) injection 80 mg (80 mg Intravenous Given 07/15/17 1812)    Davina Poke M Hammer is 62 y.o. female presenting with dyspnea on exertion, increasing peripheral edema, orthopnea and PND.  As per nursing notes she has had a 30 pound weight gain per her PA in the last several weeks.  She is been noncompliant with her low-sodium diet.  Patient with clear lung sounds, saturating well on room no tachypnea or tachycardia.  Chest x-Steller clear, proBNP with no significant elevation, EKG unchanged, troponin negative.  Doubt DVT/PE.  Patient given Lasix IV, I have advised her to increase her dose to 120 mg daily over the next 3 days.  Will follow with primary care or cardiology, we had an  extensive discussion of return precautions patient verbalized understanding.  This is a shared visit with the attending physician who personally evaluated the patient and agrees with the care plan.   Evaluation does not show pathology that would require ongoing emergent intervention or inpatient treatment. Pt is hemodynamically stable and mentating appropriately. Discussed findings and plan with patient/guardian, who agrees with care plan. All questions answered. Return precautions discussed and outpatient follow up given.   Final Clinical Impressions(s) / ED Diagnoses   Final diagnoses:  Acute on chronic congestive heart failure, unspecified heart failure type [facility])    ED Discharge Orders    None       Alannie Amodio, Charna Elizabeth 07/15/17 2021    Sherwood Gambler, MD 07/16/17 1047

## 2017-07-15 NOTE — ED Triage Notes (Signed)
Pt reports SOB for 1 week. Pt denies pain. Pt reports worsening SOB with movement. Hx of CHF. Pt reports feeling like she is fluid overloaded.

## 2017-07-15 NOTE — Discharge Instructions (Signed)
It is very important that you maintain your sodium restriction.  Over the next 3 days please increase her Lasix dosage to 120 mg, take 80 mg in the morning and 40 mg in the early afternoon.  Do not hesitate to return to the emergency department for any new, worsening or concerning symptoms.  Follow with your primary care doctor or cardiologist for checkup in the next 48 hours.

## 2017-07-15 NOTE — Progress Notes (Signed)
Name: [name]   MRN: [id number]    DOB: [birth year]   Date:07/15/2017       Progress Note  Subjective  Chief Complaint  Chief Complaint  Patient presents with  . New Patient (Initial Visit)    HPI Patient is a new patient coming in to establish care.  Patient c/o of allergies. She reports she wakes up every morning with her eyes swollen, hurting and her leg swelling. Treatments tried: Saline drops. Symptoms have been worsening for the last 2 weeks. She is also having SOB, DOE and increased fatigue.  Patient is a 62 yr old female presenting today to establish care. She has been followed by Dr. Jarold Song with Internal Medicine of recent. She is a T2DM with most recent A1c uncontrolled at 9.2. She also has h/o CHF and CAD. She is followed by Cardiology, Dr. Burt Knack. She also has CKD and is followed by a nephrologist in [place] that she cannot remember his name, but sees twice per year.   She was recently seen in the ER at [facility] on 06/17/17 due to weakness, fatigue and SOB. She was given IV fluids, had labs tested and discharged without any full cause or symptoms. Only thing of note per review of labs was hemoglobin decreasing slightly from 10.0 to 9.4. She was advised by Dr. Jarold Song to continue her iron supplementation as this was still an improvement from her previous hemoglobin following the surgery of the abscess noted on the left thigh where her hemoglobin dropped to 8.2.   She reports after the hospitalization she was feeling better but over the last two weeks she has had more SOB, fatigue, swelling of the face and legs. Most recent echo in 02/2017 showed a LVEF of 60-65%. BNP during hospitalization was normal. Her weight at hospitalization was 295 pounds on 06/17/17. Today her weight was 323 pounds.    Past Medical History:  Diagnosis Date  . Aortic stenosis    Echo 8/18: mean 13, peak 28, LVOT/AV mean velocity 0.51  . Asthma    As a child   . Bronchitis   . CAD (coronary artery  disease)    a. 09/2016: 50% Ost 1st Mrg stenosis, 50% 2nd Mrg stenosis, 20% Mid-Cx, 95% Prox LAD, 40% mid-LAD, and 10% dist-LAD stenosis. Staged PCI with DES to Prox-LAD.   [name] Chronic combined systolic and diastolic CHF (congestive heart failure) ([place]) 2011   echo 2/18: EF 55-60, normal wall motion, grade 2 diastolic dysfunction, trivial AI // echo 3/18: Septal and apical HK, EF 45-50, normal wall motion, trivial AI, mild LAE, PASP 38 // echo 8/18: EF 60-65, normal wall motion, grade 1 diastolic dysfunction, calcified aortic valve leaflets, mild aortic stenosis (mean 13, peak 28, LVOT/AV mean velocity 0.51), mild AI, moderate MAC, mild LAE, trivial TR   . Complication of anesthesia   . Diabetes mellitus Dx 1989  . Hepatitis C Dx 2013  . Hypertension Dx 1989  . Obesity   . Pancreatitis 2013  . Refusal of blood transfusions as patient is Jehovah's Witness   . Tendinitis   . Ulcer 2010    Past Surgical History:  Procedure Laterality Date  . CHOLECYSTECTOMY    . CORONARY STENT INTERVENTION N/A 09/18/2016   Procedure: Coronary Stent Intervention;  Surgeon: Troy Sine, MD;  Location: [place] [facility];  Service: Cardiovascular;  Laterality: N/A;  . EYE SURGERY    . KNEE ARTHROSCOPY    . LEFT HEART CATH N/A 09/18/2016  Procedure: Left Heart Cath;  Surgeon: Troy Sine, MD;  Location: [place] [facility];  Service: Cardiovascular;  Laterality: N/A;  . LEFT HEART CATH AND CORONARY ANGIOGRAPHY N/A 09/16/2016   Procedure: Left Heart Cath and Coronary Angiography;  Surgeon: Burnell Blanks, MD;  Location: [facility] [facility];  Service: Cardiovascular;  Laterality: N/A;  . LEFT HEART CATH AND CORONARY ANGIOGRAPHY N/A 04/29/2017   Procedure: LEFT HEART CATH AND CORONARY ANGIOGRAPHY;  Surgeon: Nelva Bush, MD;  Location: [place] [facility];  Service: Cardiovascular;  Laterality: N/A;  . TUBAL LIGATION    . TUBAL LIGATION      Family History  Problem Relation Age of Onset  . Colon  cancer Mother   . Heart attack Other   . Heart attack Maternal Grandmother   . Hypertension Sister   . Hypertension Brother   . Diabetes Paternal Grandmother   . Breast cancer Neg Hx     Social History   Socioeconomic History  . Marital status: Divorced    Spouse name: Not on file  . Number of children: Not on file  . Years of education: Not on file  . Highest education level: Not on file  Social Needs  . Financial resource strain: Not on file  . Food insecurity - worry: Not on file  . Food insecurity - inability: Not on file  . Transportation needs - medical: Not on file  . Transportation needs - non-medical: Not on file  Occupational History  . Not on file  Tobacco Use  . Smoking status: Former Smoker    Last attempt to quit: 10/25/1980    Years since quitting: 36.7  . Smokeless tobacco: Never Used  . Tobacco comment: quit smoking in 1982  Substance and Sexual Activity  . Alcohol use: Yes    Comment: 3 times in last year  . 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 Sexual activity: Not on file    Comment: Not asked  Other Topics Concern  . Not on file  Social History Narrative  . Not on file     Current Outpatient Medications:  .  acetaminophen-codeine (TYLENOL #3) 300-30 MG tablet, Take 1 tablet by mouth every 12 (twelve) hours as needed for severe pain. For osteoarthritis (unable to take NSAIDs due to CKD), Disp: 30 tablet, Rfl: 1 .  albuterol (PROVENTIL) (2.5 MG/3ML) 0.083% nebulizer solution, Take 3 mLs (2.5 mg total) by nebulization every 6 (six) hours as needed for wheezing or shortness of breath., Disp: 150 mL, Rfl: 1 .  albuterol (VENTOLIN HFA) 108 (90 Base) MCG/ACT inhaler, Inhale 2 puffs into the lungs every 4 (four) hours as needed for wheezing or shortness of breath., Disp: 54 g, Rfl: 3 .  allopurinol (ZYLOPRIM) 100 MG tablet, Take 1 tablet (100 mg total) by mouth daily., Disp: 30 tablet, Rfl: 6 .  amLODipine (NORVASC) 10 MG tablet, Take  1 tablet (10 mg total) by mouth daily., Disp: 90 tablet, Rfl: 1 .  aspirin EC 81 MG EC tablet, Take 1 tablet (81 mg total) by mouth daily., Disp: , Rfl:  .  atorvastatin (LIPITOR) 80 MG tablet, Take 1 tablet (80 mg total) by mouth daily at 6 PM., Disp: 90 tablet, Rfl: 3 .  Blood Glucose Monitoring Suppl (ACCU-CHEK AVIVA) device, Use as instructed daily., Disp: 1 each, Rfl: 0 .  carvedilol (COREG) 25 MG tablet, TAKE 1 TABLET BY MOUTH 2 TIMES DAILY WITH A MEAL., Disp: 180  tablet, Rfl: 3 .  exenatide (BYETTA 10 MCG PEN) 10 MCG/0.04ML SOPN injection, Inject 0.04 mLs (10 mcg total) into the skin 2 (two) times daily with a meal., Disp: 30 mL, Rfl: 1 .  ferrous sulfate 325 (65 FE) MG tablet, Take 1 tablet (325 mg total) by mouth 2 (two) times daily with a meal., Disp: 180 tablet, Rfl: 1 .  fluticasone (FLONASE) 50 MCG/ACT nasal spray, Place 2 sprays into both nostrils daily as needed for allergies or rhinitis., Disp: 16 g, Rfl: 5 .  Fluticasone-Salmeterol (ADVAIR DISKUS) 250-50 MCG/DOSE AEPB, Inhale 1 puff into the lungs 2 (two) times daily. (Patient taking differently: Inhale 1 puff into the lungs 2 (two) times daily as needed (shortness of breath). ), Disp: 60 each, Rfl: 3 .  furosemide (LASIX) 40 MG tablet, Take 1 tablet (40 mg total) by mouth daily., Disp: 90 tablet, Rfl: 3 .  gabapentin (NEURONTIN) 100 MG capsule, TAKE 2 CAPSULES BY MOUTH 3 TIMES A DAY. (Patient taking differently: Take 200 mg by mouth 3 (three) times daily. ), Disp: 180 capsule, Rfl: 1 .  glucose blood (ACCU-CHEK AVIVA) test strip, Use as instructed daily, Disp: 100 each, Rfl: 12 .  glucose blood (TRUE METRIX BLOOD GLUCOSE TEST) test strip, Use as instructed, Disp: 100 each, Rfl: 12 .  hydrALAZINE (APRESOLINE) 50 MG tablet, Take 1 tablet (50 mg total) by mouth 3 (three) times daily., Disp: 270 tablet, Rfl: 3 .  hydrOXYzine (ATARAX/VISTARIL) 50 MG tablet, TAKE 1 TABLET (50 MG TOTAL) BY MOUTH 3 (THREE) TIMES DAILY AS NEEDED. (Patient  taking differently: Take 50 mg by mouth 3 (three) times daily as needed for itching. ), Disp: 90 tablet, Rfl: 0 .  insulin aspart (NOVOLOG) 100 UNIT/ML injection, 0-12 units subcutaneously 3 times daily before meals according to sliding scale, Disp: 3 vial, Rfl: 5 .  Insulin Glargine (LANTUS SOLOSTAR) 100 UNIT/ML Solostar Pen, Inject 40 Units into the skin 2 (two) times daily. (Patient taking differently: Inject 30 Units into the skin 2 (two) times daily. ), Disp: 24 mL, Rfl: 2 .  isosorbide mononitrate (IMDUR) 60 MG 24 hr tablet, Take 1 tablet (60 mg total) by mouth daily., Disp: 30 tablet, Rfl: 6 .  ketotifen (ZADITOR) 0.025 % ophthalmic solution, Place 1 drop into both eyes daily as needed (for dry eyes)., Disp: 10 mL, Rfl: 2 .  Lancet Devices (ACCU-CHEK SOFTCLIX) lancets, Use as instructed daily., Disp: 1 each, Rfl: 5 .  MITIGARE 0.6 MG CAPS, Take 1 capsule by mouth daily., Disp: 90 capsule, Rfl: 0 .  nitroGLYCERIN (NITROSTAT) 0.4 MG SL tablet, Place 1 tablet (0.4 mg total) under the tongue every 5 (five) minutes x 3 doses as needed for chest pain., Disp: 25 tablet, Rfl: 12 .  omeprazole (PRILOSEC) 20 MG capsule, Take 1 capsule (20 mg total) by mouth daily., Disp: 90 capsule, Rfl: 1 .  oxyCODONE-acetaminophen (PERCOCET/ROXICET) 5-325 MG tablet, Take 1 tablet by mouth every 6 (six) hours as needed for severe pain., Disp: 6 tablet, Rfl: 0 .  pregabalin (LYRICA) 100 MG capsule, Take 1 capsule (100 mg total) by mouth 3 (three) times daily., Disp: 90 capsule, Rfl: 1 .  ticagrelor (BRILINTA) 90 MG TABS tablet, Take 1 tablet (90 mg total) by mouth 2 (two) times daily., Disp: 180 tablet, Rfl: 2 .  valACYclovir (VALTREX) 500 MG tablet, Take 1 tablet (500 mg total) by mouth daily., Disp: 90 tablet, Rfl: 3 .  docusate sodium (COLACE) 100 MG capsule, Take 1 capsule (100  mg total) every 12 (twelve) hours by mouth. (Patient not taking: Reported on 06/01/2017), Disp: 30 capsule, Rfl: 0 .   HYDROcodone-acetaminophen (NORCO/VICODIN) 5-325 MG tablet, Take 1 tablet every 4 (four) hours as needed by mouth for moderate pain. (Patient not taking: Reported on 06/18/2017), Disp: 15 tablet, Rfl: 0 .  levofloxacin (LEVAQUIN) 750 MG tablet, Take 1 tablet (750 mg total) by mouth every other day. (Patient not taking: Reported on 06/18/2017), Disp: 7 tablet, Rfl: 0 .  PEDIALYTE (PEDIALYTE) SOLN, Take 240 mLs by mouth every 6 (six) hours. , Disp: , Rfl:   Allergies  Allergen Reactions  . Shellfish Allergy Anaphylaxis and Swelling  . Diazepam Other (See Comments)    "felt like out of body experience"  . Morphine And Related Itching     Review of Systems  Constitutional: Positive for chills, diaphoresis and malaise/fatigue.       Crying, unexpected weight change  HENT: Positive for congestion, ear pain and tinnitus.        Facial Swelling  Eyes: Positive for pain and discharge.       Eye Discharge  Respiratory: Positive for shortness of breath and wheezing.        Chest tightness  Cardiovascular: Positive for leg swelling.  Gastrointestinal: Positive for constipation.       Bloating  Genitourinary:       Enuresis  Musculoskeletal:       Gait Problems  Skin: Negative.   Neurological: Positive for focal weakness.  Endo/Heme/Allergies:       Heat Intolerance  Psychiatric/Behavioral:       Sleep Disturbance    Objective  Vitals:   07/15/17 0939  BP: 138/70  Pulse: 81  Resp: 19  Temp: 97.6 F (36.4 C)  TempSrc: Oral  SpO2: 96%  Weight: (!) 323 lb (146.5 kg)    Physical Exam  Constitutional: She appears distressed.  HENT:  Head: Normocephalic and atraumatic.  Right Ear: Hearing, tympanic membrane, external ear and ear canal normal.  Left Ear: Hearing, tympanic membrane, external ear and ear canal normal.  Nose: Nose normal.  Mouth/Throat: Uvula is midline, oropharynx is clear and moist and mucous membranes are normal.  Eyes: Conjunctivae and EOM are normal. Pupils are  equal, round, and reactive to light.  Fluid retention noted under eyes  Neck: Normal range of motion. Neck supple. No tracheal deviation present. No thyromegaly present.  Cardiovascular: Normal rate and regular rhythm.  Murmur heard. Pulmonary/Chest: Effort normal. She has decreased breath sounds. She has no wheezes. She has no rhonchi. She has no rales.  Abdominal: Soft. Bowel sounds are normal.  Musculoskeletal: Normal range of motion. She exhibits edema (2+ pitting edema bilaterally).  Lymphadenopathy:    She has no cervical adenopathy.  Neurological: She is alert.  Skin: Skin is warm and dry.  Psychiatric: Mood, memory, affect and judgment normal.  Vitals reviewed.   Recent Results (from the past 2160 hour(s))  Basic metabolic panel     Status: Abnormal   Collection Time: 04/28/17 12:03 AM  Result Value Ref Range   Sodium 136 135 - 145 mmol/L   Potassium 3.7 3.5 - 5.1 mmol/L   Chloride 99 (L) 101 - 111 mmol/L   CO2 26 22 - 32 mmol/L   Glucose, Bld 198 (H) 65 - 99 mg/dL   BUN 69 (H) 6 - 20 mg/dL   Creatinine, Ser 2.19 (H) 0.44 - 1.00 mg/dL   Calcium 8.7 (L) 8.9 - 10.3 mg/dL   GFR calc  non Af Amer 23 (L) >60 mL/min   GFR calc Af Amer 27 (L) >60 mL/min    Comment: (NOTE) The eGFR has been calculated using the CKD EPI equation. This calculation has not been validated in all clinical situations. eGFR's persistently <60 mL/min signify possible Chronic Kidney Disease.    Anion gap 11 5 - 15  CBC     Status: Abnormal   Collection Time: 04/28/17 12:03 AM  Result Value Ref Range   WBC 6.5 4.0 - 10.5 K/uL   RBC 3.57 (L) 3.87 - 5.11 MIL/uL   Hemoglobin 10.0 (L) 12.0 - 15.0 g/dL   HCT 30.6 (L) 36.0 - 46.0 %   MCV 85.7 78.0 - 100.0 fL   MCH 28.0 26.0 - 34.0 pg   MCHC 32.7 30.0 - 36.0 g/dL   RDW 17.5 (H) 11.5 - 15.5 %   Platelets 227 150 - 400 K/uL  Brain natriuretic peptide     Status: None   Collection Time: 04/28/17 12:03 AM  Result Value Ref Range   B Natriuretic Peptide  38.6 0.0 - 100.0 pg/mL  I-stat troponin, ED     Status: None   Collection Time: 04/28/17 12:13 AM  Result Value Ref Range   Troponin i, poc 0.01 0.00 - 0.08 ng/mL   Comment 3            Comment: Due to the release kinetics of cTnI, a negative result within the first hours of the onset of symptoms does not rule out myocardial infarction with certainty. If myocardial infarction is still suspected, repeat the test at appropriate intervals.   I-Stat Beta hCG blood, ED ([facility], [facility], [facility] only)     Status: None   Collection Time: 04/28/17 12:13 AM  Result Value Ref Range   I-stat hCG, quantitative <5.0 <5 mIU/mL   Comment 3            Comment:   GEST. AGE      CONC.  (mIU/mL)   <=1 WEEK        5 - 50     2 WEEKS       50 - 500     3 WEEKS       100 - 10,000     4 WEEKS     1,000 - 30,000        FEMALE AND NON-PREGNANT FEMALE:     LESS THAN 5 mIU/mL   Troponin I (q 6hr x 3)     Status: None   Collection Time: 04/28/17  5:40 AM  Result Value Ref Range   Troponin I <0.03 <0.03 ng/mL  CBC     Status: Abnormal   Collection Time: 04/28/17  5:40 AM  Result Value Ref Range   WBC 6.0 4.0 - 10.5 K/uL   RBC 3.44 (L) 3.87 - 5.11 MIL/uL   Hemoglobin 9.6 (L) 12.0 - 15.0 g/dL   HCT 29.7 (L) 36.0 - 46.0 %   MCV 86.3 78.0 - 100.0 fL   MCH 27.9 26.0 - 34.0 pg   MCHC 32.3 30.0 - 36.0 g/dL   RDW 17.2 (H) 11.5 - 15.5 %   Platelets 202 150 - 400 K/uL  Creatinine, serum     Status: Abnormal   Collection Time: 04/28/17  5:40 AM  Result Value Ref Range   Creatinine, Ser 2.14 (H) 0.44 - 1.00 mg/dL   GFR calc non Af Amer 24 (L) >60 mL/min   GFR calc Af Amer 28 (L) >60  mL/min    Comment: (NOTE) The eGFR has been calculated using the CKD EPI equation. This calculation has not been validated in all clinical situations. eGFR's persistently <60 mL/min signify possible Chronic Kidney Disease.   Glucose, capillary     Status: Abnormal   Collection Time: 04/28/17  8:26 AM  Result Value Ref Range    Glucose-Capillary 194 (H) 65 - 99 mg/dL   Comment 1 Notify RN    Comment 2 Document in Chart   Troponin I (q 6hr x 3)     Status: None   Collection Time: 04/28/17 10:31 AM  Result Value Ref Range   Troponin I <0.03 <0.03 ng/mL  Glucose, capillary     Status: Abnormal   Collection Time: 04/28/17 12:43 PM  Result Value Ref Range   Glucose-Capillary 200 (H) 65 - 99 mg/dL   Comment 1 Notify RN    Comment 2 Document in Chart   Glucose, capillary     Status: Abnormal   Collection Time: 04/28/17  4:23 PM  Result Value Ref Range   Glucose-Capillary 193 (H) 65 - 99 mg/dL   Comment 1 Notify RN    Comment 2 Document in Chart   Troponin I (q 6hr x 3)     Status: None   Collection Time: 04/28/17  6:22 PM  Result Value Ref Range   Troponin I <0.03 <0.03 ng/mL  Glucose, capillary     Status: Abnormal   Collection Time: 04/28/17  8:59 PM  Result Value Ref Range   Glucose-Capillary 352 (H) 65 - 99 mg/dL   Comment 1 Notify RN    Comment 2 Document in Chart   Basic metabolic panel     Status: Abnormal   Collection Time: 04/29/17  4:11 AM  Result Value Ref Range   Sodium 134 (L) 135 - 145 mmol/L   Potassium 4.0 3.5 - 5.1 mmol/L   Chloride 97 (L) 101 - 111 mmol/L   CO2 25 22 - 32 mmol/L   Glucose, Bld 328 (H) 65 - 99 mg/dL   BUN 64 (H) 6 - 20 mg/dL   Creatinine, Ser 2.24 (H) 0.44 - 1.00 mg/dL   Calcium 8.5 (L) 8.9 - 10.3 mg/dL   GFR calc non Af Amer 23 (L) >60 mL/min   GFR calc Af Amer 26 (L) >60 mL/min    Comment: (NOTE) The eGFR has been calculated using the CKD EPI equation. This calculation has not been validated in all clinical situations. eGFR's persistently <60 mL/min signify possible Chronic Kidney Disease.    Anion gap 12 5 - 15  Protime-INR     Status: None   Collection Time: 04/29/17  4:11 AM  Result Value Ref Range   Prothrombin Time 12.7 11.4 - 15.2 seconds   INR 0.96   CBC     Status: Abnormal   Collection Time: 04/29/17  4:11 AM  Result Value Ref Range   WBC 5.0 4.0  - 10.5 K/uL   RBC 3.28 (L) 3.87 - 5.11 MIL/uL   Hemoglobin 9.3 (L) 12.0 - 15.0 g/dL   HCT 28.5 (L) 36.0 - 46.0 %   MCV 86.9 78.0 - 100.0 fL   MCH 28.4 26.0 - 34.0 pg   MCHC 32.6 30.0 - 36.0 g/dL   RDW 17.0 (H) 11.5 - 15.5 %   Platelets 212 150 - 400 K/uL  Glucose, capillary     Status: Abnormal   Collection Time: 04/29/17  7:11 AM  Result Value Ref Range  Glucose-Capillary 372 (H) 65 - 99 mg/dL   Comment 1 Notify RN    Comment 2 Document in Chart   Glucose, capillary     Status: Abnormal   Collection Time: 04/29/17 11:53 AM  Result Value Ref Range   Glucose-Capillary 444 (H) 65 - 99 mg/dL   Comment 1 Notify RN    Comment 2 Document in Chart   Glucose, capillary     Status: Abnormal   Collection Time: 04/29/17  4:43 PM  Result Value Ref Range   Glucose-Capillary 513 (HH) 65 - 99 mg/dL  Glucose, capillary     Status: Abnormal   Collection Time: 04/29/17  6:50 PM  Result Value Ref Range   Glucose-Capillary >600 (HH) 65 - 99 mg/dL  Glucose, capillary     Status: Abnormal   Collection Time: 04/29/17  7:47 PM  Result Value Ref Range   Glucose-Capillary 568 (HH) 65 - 99 mg/dL   Comment 1 Notify RN    Comment 2 Document in Chart   Glucose, capillary     Status: Abnormal   Collection Time: 04/29/17  9:21 PM  Result Value Ref Range   Glucose-Capillary 527 (HH) 65 - 99 mg/dL  Glucose, capillary     Status: Abnormal   Collection Time: 04/29/17 10:32 PM  Result Value Ref Range   Glucose-Capillary 515 (HH) 65 - 99 mg/dL  Glucose, capillary     Status: Abnormal   Collection Time: 04/29/17 11:41 PM  Result Value Ref Range   Glucose-Capillary 469 (H) 65 - 99 mg/dL   Comment 1 Notify RN    Comment 2 Document in Chart   Glucose, capillary     Status: Abnormal   Collection Time: 04/30/17 12:52 AM  Result Value Ref Range   Glucose-Capillary 389 (H) 65 - 99 mg/dL  Glucose, capillary     Status: Abnormal   Collection Time: 04/30/17  1:59 AM  Result Value Ref Range    Glucose-Capillary 391 (H) 65 - 99 mg/dL   Comment 1 Notify RN    Comment 2 Document in Chart   Glucose, capillary     Status: Abnormal   Collection Time: 04/30/17  3:31 AM  Result Value Ref Range   Glucose-Capillary 386 (H) 65 - 99 mg/dL   Comment 1 Notify RN    Comment 2 Document in Chart   Glucose, capillary     Status: Abnormal   Collection Time: 04/30/17  4:40 AM  Result Value Ref Range   Glucose-Capillary 302 (H) 65 - 99 mg/dL  Basic metabolic panel     Status: Abnormal   Collection Time: 04/30/17  5:39 AM  Result Value Ref Range   Sodium 130 (L) 135 - 145 mmol/L   Potassium 4.1 3.5 - 5.1 mmol/L   Chloride 98 (L) 101 - 111 mmol/L   CO2 21 (L) 22 - 32 mmol/L   Glucose, Bld 284 (H) 65 - 99 mg/dL   BUN 71 (H) 6 - 20 mg/dL   Creatinine, Ser 2.92 (H) 0.44 - 1.00 mg/dL   Calcium 8.2 (L) 8.9 - 10.3 mg/dL   GFR calc non Af Amer 17 (L) >60 mL/min   GFR calc Af Amer 19 (L) >60 mL/min    Comment: (NOTE) The eGFR has been calculated using the CKD EPI equation. This calculation has not been validated in all clinical situations. eGFR's persistently <60 mL/min signify possible Chronic Kidney Disease.    Anion gap 11 5 - 15  Glucose, capillary  Status: Abnormal   Collection Time: 04/30/17  5:55 AM  Result Value Ref Range   Glucose-Capillary 318 (H) 65 - 99 mg/dL   Comment 1 Notify RN    Comment 2 Document in Chart   Glucose, capillary     Status: Abnormal   Collection Time: 04/30/17  7:20 AM  Result Value Ref Range   Glucose-Capillary 244 (H) 65 - 99 mg/dL  Glucose, capillary     Status: Abnormal   Collection Time: 04/30/17  8:01 AM  Result Value Ref Range   Glucose-Capillary 219 (H) 65 - 99 mg/dL   Comment 1 Notify RN    Comment 2 Document in Chart   Glucose, capillary     Status: Abnormal   Collection Time: 04/30/17 10:03 AM  Result Value Ref Range   Glucose-Capillary 252 (H) 65 - 99 mg/dL   Comment 1 Notify RN   Glucose, capillary     Status: Abnormal   Collection  Time: 04/30/17 12:23 PM  Result Value Ref Range   Glucose-Capillary 309 (H) 65 - 99 mg/dL   Comment 1 Notify RN   Glucose, capillary     Status: Abnormal   Collection Time: 04/30/17  4:51 PM  Result Value Ref Range   Glucose-Capillary 319 (H) 65 - 99 mg/dL   Comment 1 Notify RN   Glucose, capillary     Status: Abnormal   Collection Time: 04/30/17  9:10 PM  Result Value Ref Range   Glucose-Capillary 259 (H) 65 - 99 mg/dL  Basic metabolic panel     Status: Abnormal   Collection Time: 05/01/17  4:32 AM  Result Value Ref Range   Sodium 130 (L) 135 - 145 mmol/L   Potassium 4.3 3.5 - 5.1 mmol/L   Chloride 97 (L) 101 - 111 mmol/L   CO2 21 (L) 22 - 32 mmol/L   Glucose, Bld 326 (H) 65 - 99 mg/dL   BUN 80 (H) 6 - 20 mg/dL   Creatinine, Ser 3.17 (H) 0.44 - 1.00 mg/dL   Calcium 7.9 (L) 8.9 - 10.3 mg/dL   GFR calc non Af Amer 15 (L) >60 mL/min   GFR calc Af Amer 17 (L) >60 mL/min    Comment: (NOTE) The eGFR has been calculated using the CKD EPI equation. This calculation has not been validated in all clinical situations. eGFR's persistently <60 mL/min signify possible Chronic Kidney Disease.    Anion gap 12 5 - 15  Glucose, capillary     Status: Abnormal   Collection Time: 05/01/17  7:34 AM  Result Value Ref Range   Glucose-Capillary 311 (H) 65 - 99 mg/dL  Glucose, capillary     Status: Abnormal   Collection Time: 05/01/17 12:17 PM  Result Value Ref Range   Glucose-Capillary 195 (H) 65 - 99 mg/dL  Glucose, capillary     Status: Abnormal   Collection Time: 05/01/17  5:06 PM  Result Value Ref Range   Glucose-Capillary 174 (H) 65 - 99 mg/dL  Glucose, capillary     Status: Abnormal   Collection Time: 05/01/17  9:02 PM  Result Value Ref Range   Glucose-Capillary 186 (H) 65 - 99 mg/dL  Basic metabolic panel     Status: Abnormal   Collection Time: 05/02/17  6:25 AM  Result Value Ref Range   Sodium 134 (L) 135 - 145 mmol/L   Potassium 4.3 3.5 - 5.1 mmol/L   Chloride 101 101 - 111  mmol/L   CO2 22 22 - 32 mmol/L  Glucose, Bld 286 (H) 65 - 99 mg/dL   BUN 85 (H) 6 - 20 mg/dL   Creatinine, Ser 3.08 (H) 0.44 - 1.00 mg/dL   Calcium 8.2 (L) 8.9 - 10.3 mg/dL   GFR calc non Af Amer 16 (L) >60 mL/min   GFR calc Af Amer 18 (L) >60 mL/min    Comment: (NOTE) The eGFR has been calculated using the CKD EPI equation. This calculation has not been validated in all clinical situations. eGFR's persistently <60 mL/min signify possible Chronic Kidney Disease.    Anion gap 11 5 - 15  Glucose, capillary     Status: Abnormal   Collection Time: 05/02/17  7:45 AM  Result Value Ref Range   Glucose-Capillary 269 (H) 65 - 99 mg/dL   Comment 1 Notify RN   Glucose, capillary     Status: Abnormal   Collection Time: 05/02/17 11:45 AM  Result Value Ref Range   Glucose-Capillary 189 (H) 65 - 99 mg/dL   Comment 1 Notify RN   Basic Metabolic Panel (BMET)     Status: Abnormal   Collection Time: 05/05/17 12:24 PM  Result Value Ref Range   Glucose 186 (H) 65 - 99 mg/dL   BUN 57 (H) 6 - 24 mg/dL   Creatinine, Ser 1.80 (H) 0.57 - 1.00 mg/dL   GFR calc non Af Amer 30 (L) >59 mL/min/1.73   GFR calc Af Amer 35 (L) >59 mL/min/1.73   BUN/Creatinine Ratio 32 (H) 9 - 23   Sodium 141 134 - 144 mmol/L   Potassium 5.0 3.5 - 5.2 mmol/L   Chloride 104 96 - 106 mmol/L   CO2 23 20 - 29 mmol/L   Calcium 9.0 8.7 - 10.2 mg/dL  Basic metabolic panel     Status: Abnormal   Collection Time: 05/21/17 11:45 AM  Result Value Ref Range   Glucose 226 (H) 65 - 99 mg/dL   BUN 57 (H) 6 - 24 mg/dL   Creatinine, Ser 2.14 (H) 0.57 - 1.00 mg/dL   GFR calc non Af Amer 25 (L) >59 mL/min/1.73   GFR calc Af Amer 28 (L) >59 mL/min/1.73   BUN/Creatinine Ratio 27 (H) 9 - 23   Sodium 140 134 - 144 mmol/L   Potassium 4.0 3.5 - 5.2 mmol/L   Chloride 102 96 - 106 mmol/L   CO2 20 20 - 29 mmol/L   Calcium 8.4 (L) 8.7 - 10.2 mg/dL  POCT glucose (manual entry)     Status: Abnormal   Collection Time: 05/28/17 10:07 AM  Result  Value Ref Range   POC Glucose 552 (A) 70 - 99 mg/dl  POCT glycosylated hemoglobin (Hb A1C)     Status: Abnormal   Collection Time: 05/28/17 10:22 AM  Result Value Ref Range   Hemoglobin A1C 9.2   POCT glucose (manual entry)     Status: Abnormal   Collection Time: 05/28/17 11:23 AM  Result Value Ref Range   POC Glucose 514 (A) 70 - 99 mg/dl  CBC with Differential     Status: Abnormal   Collection Time: 06/01/17  9:00 PM  Result Value Ref Range   WBC 15.1 (H) 4.0 - 10.5 K/uL   RBC 3.14 (L) 3.87 - 5.11 MIL/uL   Hemoglobin 9.3 (L) 12.0 - 15.0 g/dL   HCT 27.7 (L) 36.0 - 46.0 %   MCV 88.2 78.0 - 100.0 fL   MCH 29.6 26.0 - 34.0 pg   MCHC 33.6 30.0 - 36.0 g/dL   RDW  14.8 11.5 - 15.5 %   Platelets 304 150 - 400 K/uL   Neutrophils Relative % 77 %   Neutro Abs 11.6 (H) 1.7 - 7.7 K/uL   Lymphocytes Relative 14 %   Lymphs Abs 2.2 0.7 - 4.0 K/uL   Monocytes Relative 7 %   Monocytes Absolute 1.1 (H) 0.1 - 1.0 K/uL   Eosinophils Relative 2 %   Eosinophils Absolute 0.2 0.0 - 0.7 K/uL   Basophils Relative 0 %   Basophils Absolute 0.0 0.0 - 0.1 K/uL  Basic metabolic panel     Status: Abnormal   Collection Time: 06/01/17  9:00 PM  Result Value Ref Range   Sodium 129 (L) 135 - 145 mmol/L   Potassium 5.1 3.5 - 5.1 mmol/L   Chloride 95 (L) 101 - 111 mmol/L   CO2 22 22 - 32 mmol/L   Glucose, Bld 423 (H) 65 - 99 mg/dL   BUN 82 (H) 6 - 20 mg/dL   Creatinine, Ser 2.56 (H) 0.44 - 1.00 mg/dL   Calcium 8.3 (L) 8.9 - 10.3 mg/dL   GFR calc non Af Amer 19 (L) >60 mL/min   GFR calc Af Amer 22 (L) >60 mL/min    Comment: (NOTE) The eGFR has been calculated using the CKD EPI equation. This calculation has not been validated in all clinical situations. eGFR's persistently <60 mL/min signify possible Chronic Kidney Disease.    Anion gap 12 5 - 15  I-Stat CG4 Lactic Acid, ED     Status: None   Collection Time: 06/01/17  9:18 PM  Result Value Ref Range   Lactic Acid, Venous 1.11 0.5 - 1.9 mmol/L  CBG  monitoring, ED     Status: Abnormal   Collection Time: 06/01/17  9:31 PM  Result Value Ref Range   Glucose-Capillary 394 (H) 65 - 99 mg/dL  POC CBG, ED     Status: Abnormal   Collection Time: 06/02/17 12:02 AM  Result Value Ref Range   Glucose-Capillary 346 (H) 65 - 99 mg/dL  Wound or Superficial Culture     Status: None   Collection Time: 06/02/17 12:20 AM  Result Value Ref Range   Specimen Description ABSCESS LEFT THIGH    Special Requests NONE    Gram Stain      FEW WBC PRESENT,BOTH PMN AND MONONUCLEAR MODERATE GRAM POSITIVE COCCI IN CHAINS MODERATE GRAM NEGATIVE RODS    Culture      MODERATE VIRIDANS STREPTOCOCCUS RARE EIKENELLA CORRODENS Usually susceptible to penicillin and other beta lactam agents,quinolones,macrolides and tetracyclines. Performed at [facility], [place] [address], [place], [place] [address]    Report Status 06/06/2017 FINAL    Organism ID, Bacteria VIRIDANS STREPTOCOCCUS       Susceptibility   Viridans streptococcus - MIC*    PENICILLIN INTERMEDIATE Intermediate     CEFTRIAXONE 1 SENSITIVE Sensitive     ERYTHROMYCIN >=8 RESISTANT Resistant     LEVOFLOXACIN <=0.25 SENSITIVE Sensitive     VANCOMYCIN 0.5 SENSITIVE Sensitive     * MODERATE VIRIDANS STREPTOCOCCUS  I-Stat CG4 Lactic Acid, ED     Status: Abnormal   Collection Time: 06/02/17 12:34 AM  Result Value Ref Range   Lactic Acid, Venous 0.47 (L) 0.5 - 1.9 mmol/L  CBG monitoring, ED     Status: Abnormal   Collection Time: 06/02/17 12:39 AM  Result Value Ref Range   Glucose-Capillary 323 (H) 65 - 99 mg/dL  CBG monitoring, ED     Status: Abnormal  Collection Time: 06/02/17  1:48 AM  Result Value Ref Range   Glucose-Capillary 292 (H) 65 - 99 mg/dL  Glucose, capillary     Status: Abnormal   Collection Time: 06/02/17  7:23 AM  Result Value Ref Range   Glucose-Capillary 377 (H) 65 - 99 mg/dL  CBC     Status: Abnormal   Collection Time: 06/02/17 11:26 AM  Result Value Ref Range   WBC 12.4  (H) 4.0 - 10.5 K/uL   RBC 2.50 (L) 3.87 - 5.11 MIL/uL   Hemoglobin 7.4 (L) 12.0 - 15.0 g/dL    Comment: REPEATED TO VERIFY DELTA CHECK NOTED    HCT 22.1 (L) 36.0 - 46.0 %   MCV 88.4 78.0 - 100.0 fL   MCH 29.6 26.0 - 34.0 pg   MCHC 33.5 30.0 - 36.0 g/dL   RDW 14.5 11.5 - 15.5 %   Platelets 288 150 - 400 K/uL  Glucose, capillary     Status: Abnormal   Collection Time: 06/02/17 12:14 PM  Result Value Ref Range   Glucose-Capillary 395 (H) 65 - 99 mg/dL  Urinalysis, Routine w reflex microscopic     Status: Abnormal   Collection Time: 06/02/17  2:55 PM  Result Value Ref Range   Color, Urine YELLOW YELLOW   APPearance HAZY (A) CLEAR   Specific Gravity, Urine 1.010 1.005 - 1.030   pH 5.0 5.0 - 8.0   Glucose, UA 50 (A) NEGATIVE mg/dL   Hgb urine dipstick LARGE (A) NEGATIVE   Bilirubin Urine NEGATIVE NEGATIVE   Ketones, ur NEGATIVE NEGATIVE mg/dL   Protein, ur 30 (A) NEGATIVE mg/dL   Nitrite NEGATIVE NEGATIVE   Leukocytes, UA NEGATIVE NEGATIVE   RBC / HPF TOO NUMEROUS TO COUNT 0 - 5 RBC/hpf   WBC, UA 0-5 0 - 5 WBC/hpf   Bacteria, UA MANY (A) NONE SEEN   Squamous Epithelial / LPF 0-5 (A) NONE SEEN   Hyaline Casts, UA PRESENT   Glucose, capillary     Status: Abnormal   Collection Time: 06/02/17  5:18 PM  Result Value Ref Range   Glucose-Capillary 271 (H) 65 - 99 mg/dL  Glucose, capillary     Status: Abnormal   Collection Time: 06/02/17  9:55 PM  Result Value Ref Range   Glucose-Capillary 249 (H) 65 - 99 mg/dL  Type and screen [place]     Status: None   Collection Time: 06/03/17 12:38 AM  Result Value Ref Range   ABO/RH(D) A POS    Antibody Screen NEG    Sample Expiration 06/06/2017    Unit Number [id number]    Blood Component Type RED CELLS,LR    Unit division 00    Status of Unit ISSUED,FINAL    Transfusion Status OK TO TRANSFUSE    Crossmatch Result Compatible   ABO/Rh     Status: None   Collection Time: 06/03/17 12:38 AM  Result Value Ref  Range   ABO/RH(D) A POS   BPAM RBC     Status: None   Collection Time: 06/03/17 12:38 AM  Result Value Ref Range   ISSUE DATE / TIME [id number]    Blood Product Unit Number [id number]    PRODUCT CODE [id number]    Unit Type and Rh 6200    Blood Product Expiration Date [id number]   Hemoglobin and hematocrit, blood     Status: Abnormal   Collection Time: 06/03/17 12:51 AM  Result Value Ref Range   Hemoglobin 7.4 (L) 12.0 -  15.0 g/dL   HCT 22.4 (L) 36.0 - 46.0 %  Prepare RBC     Status: None   Collection Time: 06/03/17  2:30 AM  Result Value Ref Range   Order Confirmation ORDER PROCESSED BY BLOOD BANK   Basic metabolic panel     Status: Abnormal   Collection Time: 06/03/17  6:15 AM  Result Value Ref Range   Sodium 135 135 - 145 mmol/L   Potassium 4.8 3.5 - 5.1 mmol/L   Chloride 103 101 - 111 mmol/L   CO2 22 22 - 32 mmol/L   Glucose, Bld 293 (H) 65 - 99 mg/dL   BUN 75 (H) 6 - 20 mg/dL   Creatinine, Ser 2.19 (H) 0.44 - 1.00 mg/dL   Calcium 8.3 (L) 8.9 - 10.3 mg/dL   GFR calc non Af Amer 23 (L) >60 mL/min   GFR calc Af Amer 27 (L) >60 mL/min    Comment: (NOTE) The eGFR has been calculated using the CKD EPI equation. This calculation has not been validated in all clinical situations. eGFR's persistently <60 mL/min signify possible Chronic Kidney Disease.    Anion gap 10 5 - 15  CBC     Status: Abnormal   Collection Time: 06/03/17  6:15 AM  Result Value Ref Range   WBC 13.7 (H) 4.0 - 10.5 K/uL   RBC 2.80 (L) 3.87 - 5.11 MIL/uL   Hemoglobin 8.2 (L) 12.0 - 15.0 g/dL   HCT 24.7 (L) 36.0 - 46.0 %   MCV 88.2 78.0 - 100.0 fL   MCH 29.3 26.0 - 34.0 pg   MCHC 33.2 30.0 - 36.0 g/dL   RDW 14.6 11.5 - 15.5 %   Platelets 272 150 - 400 K/uL  Glucose, capillary     Status: Abnormal   Collection Time: 06/03/17  7:39 AM  Result Value Ref Range   Glucose-Capillary 300 (H) 65 - 99 mg/dL  Glucose, capillary     Status: Abnormal   Collection Time: 06/03/17 11:37 AM  Result Value Ref  Range   Glucose-Capillary 297 (H) 65 - 99 mg/dL  Glucose, capillary     Status: Abnormal   Collection Time: 06/03/17  5:12 PM  Result Value Ref Range   Glucose-Capillary 202 (H) 65 - 99 mg/dL  Glucose, capillary     Status: Abnormal   Collection Time: 06/03/17 10:04 PM  Result Value Ref Range   Glucose-Capillary 188 (H) 65 - 99 mg/dL  Glucose, capillary     Status: Abnormal   Collection Time: 06/04/17  7:41 AM  Result Value Ref Range   Glucose-Capillary 143 (H) 65 - 99 mg/dL  Glucose, capillary     Status: Abnormal   Collection Time: 06/04/17 11:58 AM  Result Value Ref Range   Glucose-Capillary 177 (H) 65 - 99 mg/dL  Glucose, capillary     Status: Abnormal   Collection Time: 06/04/17  5:25 PM  Result Value Ref Range   Glucose-Capillary 141 (H) 65 - 99 mg/dL  Glucose, capillary     Status: Abnormal   Collection Time: 06/04/17  9:18 PM  Result Value Ref Range   Glucose-Capillary 180 (H) 65 - 99 mg/dL  Creatinine, serum     Status: Abnormal   Collection Time: 06/05/17  5:52 AM  Result Value Ref Range   Creatinine, Ser 2.22 (H) 0.44 - 1.00 mg/dL   GFR calc non Af Amer 23 (L) >60 mL/min   GFR calc Af Amer 27 (L) >60 mL/min    Comment: (  NOTE) The eGFR has been calculated using the CKD EPI equation. This calculation has not been validated in all clinical situations. eGFR's persistently <60 mL/min signify possible Chronic Kidney Disease.   Glucose, capillary     Status: Abnormal   Collection Time: 06/05/17  8:00 AM  Result Value Ref Range   Glucose-Capillary 168 (H) 65 - 99 mg/dL  Glucose, capillary     Status: Abnormal   Collection Time: 06/05/17 11:57 AM  Result Value Ref Range   Glucose-Capillary 168 (H) 65 - 99 mg/dL  Glucose, capillary     Status: Abnormal   Collection Time: 06/05/17  5:20 PM  Result Value Ref Range   Glucose-Capillary 171 (H) 65 - 99 mg/dL  Glucose, capillary     Status: Abnormal   Collection Time: 06/05/17  9:27 PM  Result Value Ref Range    Glucose-Capillary 171 (H) 65 - 99 mg/dL  CBC     Status: Abnormal   Collection Time: 06/06/17  5:14 AM  Result Value Ref Range   WBC 11.1 (H) 4.0 - 10.5 K/uL   RBC 2.83 (L) 3.87 - 5.11 MIL/uL   Hemoglobin 8.3 (L) 12.0 - 15.0 g/dL   HCT 25.1 (L) 36.0 - 46.0 %   MCV 88.7 78.0 - 100.0 fL   MCH 29.3 26.0 - 34.0 pg   MCHC 33.1 30.0 - 36.0 g/dL   RDW 14.7 11.5 - 15.5 %   Platelets 290 150 - 400 K/uL  Basic metabolic panel     Status: Abnormal   Collection Time: 06/06/17  5:14 AM  Result Value Ref Range   Sodium 138 135 - 145 mmol/L   Potassium 4.9 3.5 - 5.1 mmol/L   Chloride 107 101 - 111 mmol/L   CO2 24 22 - 32 mmol/L   Glucose, Bld 210 (H) 65 - 99 mg/dL   BUN 60 (H) 6 - 20 mg/dL   Creatinine, Ser 2.16 (H) 0.44 - 1.00 mg/dL   Calcium 8.4 (L) 8.9 - 10.3 mg/dL   GFR calc non Af Amer 24 (L) >60 mL/min   GFR calc Af Amer 28 (L) >60 mL/min    Comment: (NOTE) The eGFR has been calculated using the CKD EPI equation. This calculation has not been validated in all clinical situations. eGFR's persistently <60 mL/min signify possible Chronic Kidney Disease.    Anion gap 7 5 - 15  Glucose, capillary     Status: Abnormal   Collection Time: 06/06/17  7:18 AM  Result Value Ref Range   Glucose-Capillary 181 (H) 65 - 99 mg/dL  POCT glucose (manual entry)     Status: Abnormal   Collection Time: 06/15/17 11:13 AM  Result Value Ref Range   POC Glucose 196 (A) 70 - 99 mg/dl  CBC with Differential/Platelet     Status: Abnormal   Collection Time: 06/15/17 12:02 PM  Result Value Ref Range   WBC 10.2 3.4 - 10.8 x10E3/uL   RBC 3.49 (L) 3.77 - 5.28 x10E6/uL   Hemoglobin 10.0 (L) 11.1 - 15.9 g/dL   Hematocrit 30.3 (L) 34.0 - 46.6 %   MCV 87 79 - 97 fL   MCH 28.7 26.6 - 33.0 pg   MCHC 33.0 31.5 - 35.7 g/dL   RDW 15.9 (H) 12.3 - 15.4 %   Platelets 384 (H) 150 - 379 x10E3/uL   Neutrophils 67 Not Estab. %   Lymphs 22 Not Estab. %   Monocytes 9 Not Estab. %   Eos 2 Not Estab. %  Basos 0 Not  Estab. %   Neutrophils Absolute 6.8 1.4 - 7.0 x10E3/uL   Lymphocytes Absolute 2.2 0.7 - 3.1 x10E3/uL   Monocytes Absolute 0.9 0.1 - 0.9 x10E3/uL   EOS (ABSOLUTE) 0.2 0.0 - 0.4 x10E3/uL   Basophils Absolute 0.0 0.0 - 0.2 x10E3/uL   Immature Granulocytes 0 Not Estab. %   Immature Grans (Abs) 0.0 0.0 - 0.1 x10E3/uL   Hematology Comments: Note:     Comment: Verified by microscopic examination.  Basic metabolic panel     Status: Abnormal   Collection Time: 06/17/17  9:05 PM  Result Value Ref Range   Sodium 134 (L) 135 - 145 mmol/L   Potassium 3.4 (L) 3.5 - 5.1 mmol/L   Chloride 98 (L) 101 - 111 mmol/L   CO2 23 22 - 32 mmol/L   Glucose, Bld 217 (H) 65 - 99 mg/dL   BUN 59 (H) 6 - 20 mg/dL   Creatinine, Ser 2.35 (H) 0.44 - 1.00 mg/dL   Calcium 8.8 (L) 8.9 - 10.3 mg/dL   GFR calc non Af Amer 22 (L) >60 mL/min   GFR calc Af Amer 25 (L) >60 mL/min    Comment: (NOTE) The eGFR has been calculated using the CKD EPI equation. This calculation has not been validated in all clinical situations. eGFR's persistently <60 mL/min signify possible Chronic Kidney Disease.    Anion gap 13 5 - 15  CBC     Status: Abnormal   Collection Time: 06/17/17  9:05 PM  Result Value Ref Range   WBC 7.6 4.0 - 10.5 K/uL   RBC 3.34 (L) 3.87 - 5.11 MIL/uL   Hemoglobin 9.4 (L) 12.0 - 15.0 g/dL   HCT 29.0 (L) 36.0 - 46.0 %   MCV 86.8 78.0 - 100.0 fL   MCH 28.1 26.0 - 34.0 pg   MCHC 32.4 30.0 - 36.0 g/dL   RDW 14.5 11.5 - 15.5 %   Platelets 335 150 - 400 K/uL  Brain natriuretic peptide     Status: None   Collection Time: 06/17/17  9:15 PM  Result Value Ref Range   B Natriuretic Peptide 9.5 0.0 - 100.0 pg/mL  I-stat troponin, ED     Status: None   Collection Time: 06/17/17  9:16 PM  Result Value Ref Range   Troponin i, poc 0.01 0.00 - 0.08 ng/mL   Comment 3            Comment: Due to the release kinetics of cTnI, a negative result within the first hours of the onset of symptoms does not rule out myocardial  infarction with certainty. If myocardial infarction is still suspected, repeat the test at appropriate intervals.   I-Stat beta hCG blood, ED     Status: None   Collection Time: 06/17/17  9:16 PM  Result Value Ref Range   I-stat hCG, quantitative <5.0 <5 mIU/mL   Comment 3            Comment:   GEST. AGE      CONC.  (mIU/mL)   <=1 WEEK        5 - 50     2 WEEKS       50 - 500     3 WEEKS       100 - 10,000     4 WEEKS     1,000 - 30,000        FEMALE AND NON-PREGNANT FEMALE:     LESS THAN 5 mIU/mL  Urinalysis, Routine w reflex microscopic     Status: Abnormal   Collection Time: 06/18/17  3:57 AM  Result Value Ref Range   Color, Urine YELLOW YELLOW   APPearance HAZY (A) CLEAR   Specific Gravity, Urine 1.009 1.005 - 1.030   pH 5.0 5.0 - 8.0   Glucose, UA NEGATIVE NEGATIVE mg/dL   Hgb urine dipstick NEGATIVE NEGATIVE   Bilirubin Urine NEGATIVE NEGATIVE   Ketones, ur NEGATIVE NEGATIVE mg/dL   Protein, ur 100 (A) NEGATIVE mg/dL   Nitrite NEGATIVE NEGATIVE   Leukocytes, UA NEGATIVE NEGATIVE   RBC / HPF 0-5 0 - 5 RBC/hpf   WBC, UA 0-5 0 - 5 WBC/hpf   Bacteria, UA RARE (A) NONE SEEN   Squamous Epithelial / LPF 6-30 (A) NONE SEEN   Mucus PRESENT    Hyaline Casts, UA PRESENT      Assessment & Plan  1. Establishing care with new doctor, encounter for [facility] care today.   2. Acute on chronic combined systolic and diastolic heart failure (HCC) Suspect acute on chronic HF as cause of symptoms. Weight gain almost 30 pounds over last 4 weeks (documented). Seems symptoms started 2 weeks ago per patient report. Patient and daughter were advised to report to the ER for further evaluation due to patient acuity. They are both in agreement.

## 2017-07-16 ENCOUNTER — Telehealth: Payer: Self-pay

## 2017-07-16 MED FILL — FUROSEMIDE 40 MG TAB: 40 | 30 days supply | Qty: 90 | Fill #3

## 2017-07-16 NOTE — Telephone Encounter (Signed)
-----   Message from Sherren Mocha, MD sent at 07/15/2017 10:59 PM EST ----- Valetta Fuller - can you get her an APP visit? Seen in ER today with CHF.   thx

## 2017-07-16 NOTE — Telephone Encounter (Signed)
Left message for patient to call and arrange APP OV.

## 2017-07-19 ENCOUNTER — Ambulatory Visit: Payer: Medicaid Other | Admitting: Family Medicine

## 2017-07-19 ENCOUNTER — Other Ambulatory Visit: Payer: Self-pay | Admitting: Family Medicine

## 2017-07-19 DIAGNOSIS — IMO0002 Reserved for concepts with insufficient information to code with codable children: Secondary | ICD-10-CM

## 2017-07-19 DIAGNOSIS — E1165 Type 2 diabetes mellitus with hyperglycemia: Secondary | ICD-10-CM

## 2017-07-19 DIAGNOSIS — E118 Type 2 diabetes mellitus with unspecified complications: Principal | ICD-10-CM

## 2017-07-19 MED FILL — VALACYCLOVIR HCL 500 MG TAB: 500 | 30 days supply | Qty: 30 | Fill #4

## 2017-07-19 MED FILL — CARVEDILOL 25 MG TABLET: 25 | 30 days supply | Qty: 60 | Fill #4

## 2017-07-19 MED FILL — LANTUS SOLOSTAR 100 UNITS/M: 100 | 30 days supply | Qty: 24 | Fill #2

## 2017-07-20 NOTE — Telephone Encounter (Signed)
Patient has been scheduled 1/24 with B. Bhagat, PA.

## 2017-07-22 ENCOUNTER — Telehealth: Payer: Self-pay | Admitting: Cardiovascular Disease

## 2017-07-22 NOTE — Telephone Encounter (Signed)
Informed [name] that form has not been yet received.  She will fax to [phone]. She understands Dr. Burt Knack will return on Monday and review. She was grateful for call.

## 2017-07-22 NOTE — Telephone Encounter (Signed)
New Message  [name] williams call to see if Dr. Burt Knack received the pts chronic decease form. Please call back to confirm

## 2017-07-23 ENCOUNTER — Encounter: Payer: Self-pay | Admitting: Physician Assistant

## 2017-07-23 ENCOUNTER — Ambulatory Visit: Payer: Medicaid Other | Admitting: Physician Assistant

## 2017-07-23 VITALS — BP 152/78 | HR 88 | Temp 98.1°F | Resp 18 | Wt 311.0 lb

## 2017-07-23 DIAGNOSIS — I5042 Chronic combined systolic (congestive) and diastolic (congestive) heart failure: Secondary | ICD-10-CM

## 2017-07-23 DIAGNOSIS — Z9889 Other specified postprocedural states: Secondary | ICD-10-CM

## 2017-07-23 DIAGNOSIS — IMO0002 Reserved for concepts with insufficient information to code with codable children: Secondary | ICD-10-CM

## 2017-07-23 DIAGNOSIS — E1165 Type 2 diabetes mellitus with hyperglycemia: Secondary | ICD-10-CM | POA: Diagnosis not present

## 2017-07-23 DIAGNOSIS — E118 Type 2 diabetes mellitus with unspecified complications: Secondary | ICD-10-CM | POA: Diagnosis not present

## 2017-07-23 DIAGNOSIS — N184 Chronic kidney disease, stage 4 (severe): Secondary | ICD-10-CM | POA: Diagnosis not present

## 2017-07-23 DIAGNOSIS — B182 Chronic viral hepatitis C: Secondary | ICD-10-CM

## 2017-07-23 DIAGNOSIS — G4733 Obstructive sleep apnea (adult) (pediatric): Secondary | ICD-10-CM | POA: Diagnosis not present

## 2017-07-23 DIAGNOSIS — I10 Essential (primary) hypertension: Secondary | ICD-10-CM | POA: Diagnosis not present

## 2017-07-23 DIAGNOSIS — L0232 Furuncle of buttock: Secondary | ICD-10-CM | POA: Diagnosis not present

## 2017-07-23 DIAGNOSIS — M170 Bilateral primary osteoarthritis of knee: Secondary | ICD-10-CM | POA: Diagnosis not present

## 2017-07-23 MED ORDER — DOXYCYCLINE HYCLATE 100 MG PO TABS: 100.0000 mg | ORAL_TABLET | Freq: Two times a day (BID) | ORAL | 0 refills | Status: DC

## 2017-07-23 MED ORDER — INSULIN ASPART 100 UNIT/ML FLEXPEN: PEN_INJECTOR | SUBCUTANEOUS | 11 refills | Status: DC

## 2017-07-23 MED ORDER — EXENATIDE ER 2 MG/0.85ML ~~LOC~~ AUIJ: 2.0000 mg | AUTO-INJECTOR | SUBCUTANEOUS | 5 refills | Status: DC

## 2017-07-23 MED ORDER — INSULIN GLARGINE 100 UNIT/ML SOLOSTAR PEN: 40.0000 [IU] | PEN_INJECTOR | Freq: Two times a day (BID) | SUBCUTANEOUS | 2 refills | Status: DC

## 2017-07-23 NOTE — Progress Notes (Signed)
Patient: [name] Female    DOB: September 26, 1955   62 y.o.   MRN: [id number] Visit Date: 07/23/2017  Today's Provider: [name], PA-C   Chief Complaint  Patient presents with  . Hospitalization Follow-up   Subjective:    HPI Pt is here today because she had a ED visit on 07/15/17 for Weight gain and shortness of breath. Pt reports that her shortness of breath is better she was 323 on 07/15/17 and today she is 311. She does f/u with cardiology on 08/05/17.   She reports that she also has what she thinks is a boil on her right buttock. She reports that it has been there for a week.   Also she wants a referral to GYN due to abnormal menstruals s/p ablation and to ortho for her knee pain (osteoarthritis). She was told in the past and told she needed a knee replacement. She is also in need of an eye exam for diabetes. Has podiatry appt already scheduled for 08/05/17 as well.      Allergies  Allergen Reactions  . Shellfish Allergy Anaphylaxis and Swelling  . Diazepam Other (See Comments)    "felt like out of body experience"  . Morphine And Related Itching     Current Outpatient Medications:  .  acetaminophen-codeine (TYLENOL #3) 300-30 MG tablet, Take 1 tablet by mouth every 12 (twelve) hours as needed for severe pain. For osteoarthritis (unable to take NSAIDs due to CKD), Disp: 30 tablet, Rfl: 1 .  albuterol (PROVENTIL) (2.5 MG/3ML) 0.083% nebulizer solution, Take 3 mLs (2.5 mg total) by nebulization every 6 (six) hours as needed for wheezing or shortness of breath., Disp: 150 mL, Rfl: 1 .  albuterol (VENTOLIN HFA) 108 (90 Base) MCG/ACT inhaler, Inhale 2 puffs into the lungs every 4 (four) hours as needed for wheezing or shortness of breath., Disp: 54 g, Rfl: 3 .  allopurinol (ZYLOPRIM) 100 MG tablet, Take 1 tablet (100 mg total) by mouth daily., Disp: 30 tablet, Rfl: 6 .  amLODipine (NORVASC) 10 MG tablet, Take 1 tablet (10 mg total) by mouth daily., Disp: 90 tablet, Rfl:  1 .  aspirin EC 81 MG EC tablet, Take 1 tablet (81 mg total) by mouth daily., Disp: , Rfl:  .  atorvastatin (LIPITOR) 80 MG tablet, Take 1 tablet (80 mg total) by mouth daily at 6 PM., Disp: 90 tablet, Rfl: 3 .  Blood Glucose Monitoring Suppl (ACCU-CHEK AVIVA) device, Use as instructed daily., Disp: 1 each, Rfl: 0 .  carvedilol (COREG) 25 MG tablet, TAKE 1 TABLET BY MOUTH 2 TIMES DAILY WITH A MEAL., Disp: 180 tablet, Rfl: 3 .  docusate sodium (COLACE) 100 MG capsule, Take 1 capsule (100 mg total) every 12 (twelve) hours by mouth. (Patient not taking: Reported on 06/01/2017), Disp: 30 capsule, Rfl: 0 .  exenatide (BYETTA 10 MCG PEN) 10 MCG/0.04ML SOPN injection, Inject 0.04 mLs (10 mcg total) into the skin 2 (two) times daily with a meal., Disp: 30 mL, Rfl: 1 .  ferrous sulfate 325 (65 FE) MG tablet, Take 1 tablet (325 mg total) by mouth 2 (two) times daily with a meal., Disp: 180 tablet, Rfl: 1 .  fluticasone (FLONASE) 50 MCG/ACT nasal spray, Place 2 sprays into both nostrils daily as needed for allergies or rhinitis., Disp: 16 g, Rfl: 5 .  Fluticasone-Salmeterol (ADVAIR DISKUS) 250-50 MCG/DOSE AEPB, Inhale 1 puff into the lungs 2 (two) times daily. (Patient taking differently: Inhale 1 puff  into the lungs 2 (two) times daily as needed (shortness of breath). ), Disp: 60 each, Rfl: 3 .  furosemide (LASIX) 40 MG tablet, Take 1 tablet (40 mg total) by mouth daily., Disp: 90 tablet, Rfl: 3 .  gabapentin (NEURONTIN) 100 MG capsule, TAKE 2 CAPSULES BY MOUTH 3 TIMES A DAY. (Patient taking differently: Take 200 mg by mouth 3 (three) times daily. ), Disp: 180 capsule, Rfl: 1 .  glucose blood (ACCU-CHEK AVIVA) test strip, Use as instructed daily, Disp: 100 each, Rfl: 12 .  glucose blood (TRUE METRIX BLOOD GLUCOSE TEST) test strip, Use as instructed, Disp: 100 each, Rfl: 12 .  hydrALAZINE (APRESOLINE) 50 MG tablet, Take 1 tablet (50 mg total) by mouth 3 (three) times daily., Disp: 270 tablet, Rfl: 3 .   HYDROcodone-acetaminophen (NORCO/VICODIN) 5-325 MG tablet, Take 1 tablet every 4 (four) hours as needed by mouth for moderate pain. (Patient not taking: Reported on 06/18/2017), Disp: 15 tablet, Rfl: 0 .  hydrOXYzine (ATARAX/VISTARIL) 50 MG tablet, TAKE 1 TABLET (50 MG TOTAL) BY MOUTH 3 (THREE) TIMES DAILY AS NEEDED. (Patient taking differently: Take 50 mg by mouth 3 (three) times daily as needed for itching. ), Disp: 90 tablet, Rfl: 0 .  insulin aspart (NOVOLOG) 100 UNIT/ML injection, 0-12 units subcutaneously 3 times daily before meals according to sliding scale, Disp: 3 vial, Rfl: 5 .  Insulin Glargine (LANTUS SOLOSTAR) 100 UNIT/ML Solostar Pen, Inject 40 Units into the skin 2 (two) times daily. (Patient taking differently: Inject 30 Units into the skin 2 (two) times daily. ), Disp: 24 mL, Rfl: 2 .  isosorbide mononitrate (IMDUR) 60 MG 24 hr tablet, Take 1 tablet (60 mg total) by mouth daily., Disp: 30 tablet, Rfl: 6 .  ketotifen (ZADITOR) 0.025 % ophthalmic solution, Place 1 drop into both eyes daily as needed (for dry eyes)., Disp: 10 mL, Rfl: 2 .  Lancet Devices (ACCU-CHEK SOFTCLIX) lancets, Use as instructed daily., Disp: 1 each, Rfl: 5 .  levofloxacin (LEVAQUIN) 750 MG tablet, Take 1 tablet (750 mg total) by mouth every other day. (Patient not taking: Reported on 06/18/2017), Disp: 7 tablet, Rfl: 0 .  MITIGARE 0.6 MG CAPS, Take 1 capsule by mouth daily., Disp: 90 capsule, Rfl: 0 .  nitroGLYCERIN (NITROSTAT) 0.4 MG SL tablet, Place 1 tablet (0.4 mg total) under the tongue every 5 (five) minutes x 3 doses as needed for chest pain., Disp: 25 tablet, Rfl: 12 .  omeprazole (PRILOSEC) 20 MG capsule, Take 1 capsule (20 mg total) by mouth daily., Disp: 90 capsule, Rfl: 1 .  oxyCODONE-acetaminophen (PERCOCET/ROXICET) 5-325 MG tablet, Take 1 tablet by mouth every 6 (six) hours as needed for severe pain., Disp: 6 tablet, Rfl: 0 .  PEDIALYTE (PEDIALYTE) SOLN, Take 240 mLs by mouth every 6 (six) hours. , Disp:  , Rfl:  .  pregabalin (LYRICA) 100 MG capsule, Take 1 capsule (100 mg total) by mouth 3 (three) times daily., Disp: 90 capsule, Rfl: 1 .  ticagrelor (BRILINTA) 90 MG TABS tablet, Take 1 tablet (90 mg total) by mouth 2 (two) times daily., Disp: 180 tablet, Rfl: 2 .  valACYclovir (VALTREX) 500 MG tablet, Take 1 tablet (500 mg total) by mouth daily., Disp: 90 tablet, Rfl: 3  Review of Systems  Constitutional: Negative.   HENT: Negative.   Eyes: Negative.   Respiratory: Positive for shortness of breath.   Cardiovascular: Negative.   Gastrointestinal: Negative.   Endocrine: Negative.   Genitourinary: Negative.   Musculoskeletal: Positive for arthralgias.  Skin: Positive for wound.  Allergic/Immunologic: Negative.   Neurological: Negative.   Hematological: Negative.   Psychiatric/Behavioral: Negative.     Social History   Tobacco Use  . Smoking status: Former Smoker    Last attempt to quit: 10/25/1980    Years since quitting: 36.7  . Smokeless tobacco: Never Used  . Tobacco comment: quit smoking in 1982  Substance Use Topics  . Alcohol use: Yes    Comment: 3 times in last year   Objective:   BP (!) 152/78 (BP Location: Right Arm, Patient Position: Sitting, Cuff Size: Normal) Comment (Cuff Size): taken on lower arm  Pulse 88   Temp 98.1 F (36.7 C) (Oral)   Resp 18   Wt (!) 311 lb (141.1 kg)   SpO2 97%   BMI 47.29 kg/m  Vitals:   07/23/17 1616  BP: (!) 152/78  Pulse: 88  Resp: 18  Temp: 98.1 F (36.7 C)  TempSrc: Oral  SpO2: 97%  Weight: (!) 311 lb (141.1 kg)     Physical Exam  Constitutional: She appears well-developed and well-nourished. No distress.  HENT:  Swelling of face much improved   Neck: Normal range of motion. Neck supple. No JVD present. No tracheal deviation present. No thyromegaly present.  Cardiovascular: Normal rate, regular rhythm and normal heart sounds. Exam reveals no gallop and no friction rub.  No murmur heard. Pulmonary/Chest: Effort  normal and breath sounds normal. No respiratory distress. She has no wheezes. She has no rales.  Genitourinary:     Musculoskeletal: She exhibits edema (2-3+ pitting edema).  Lymphadenopathy:    She has no cervical adenopathy.  Skin: She is not diaphoretic.  Vitals reviewed.       Assessment & Plan:     1. Essential hypertension Stable.   2. Chronic combined systolic and diastolic CHF (congestive heart failure) (HCC) Improving with furosemide 40mg  BID. Sees cardiology on 08/05/17.  3. Hep C w/o coma, chronic (HCC)  4. Primary osteoarthritis of both knees Worsening. Patient requesting referral for further evaluation.  - Ambulatory referral to Orthopedic Surgery  5. Diabetes mellitus type 2, uncontrolled, with complications ([place]) In need of diabetic eye exam. Referral placed. Patient starting on Bydureon BCise weekly injection. Lantus refilled. Novolog switched from vial to pen per patient request. I will see her back in 4 weeks to check labs.  - Ambulatory referral to Ophthalmology - Exenatide ER (BYDUREON BCISE) 2 MG/0.85ML AUIJ; Inject 2 mg into the skin once a week.  Dispense: 4 pen; Refill: 5 - Insulin Glargine (LANTUS SOLOSTAR) 100 UNIT/ML Solostar Pen; Inject 40 Units into the skin 2 (two) times daily.  Dispense: 24 mL; Refill: 2 - insulin aspart (NOVOLOG) 100 UNIT/ML FlexPen; 1-12 units with meals as needed per sliding scale instructions  Dispense: 15 mL; Refill: 11  6. CKD (chronic kidney disease) stage 4, GFR 15-29 ml/min (HCC) Currently stable. I will see patient back in 4 weeks to check labs.  7. H/O prior ablation treatment S/P uterine ablation. Prefers GYN to continue pelvic exams. - Ambulatory referral to Gynecology  8. Boil of buttock Worsening. No fluctuance to I&D and it is autodraining some. Will start doxycycline as below. Warm compresses. Sitz baths.  - doxycycline (VIBRA-TABS) 100 MG tablet; Take 1 tablet (100 mg total) by mouth 2 (two) times daily.   Dispense: 20 tablet; Refill: 0  9. OSA (obstructive sleep apnea) Patient has known sleep apnea. She is in need of a CPAP titration study to learn how to  work her CPAP.  - Ambulatory referral to Sleep Studies       [name], PA-C  [facility]

## 2017-07-23 NOTE — Telephone Encounter (Signed)
Fax received for Dr. Burt Knack to review Monday.

## 2017-07-23 NOTE — Patient Instructions (Signed)
Skin Abscess A skin abscess is an infected area on or under your skin that contains a collection of pus and other material. An abscess may also be called a furuncle, carbuncle, or boil. An abscess can occur in or on almost any part of your body. Some abscesses break open (rupture) on their own. Most continue to get worse unless they are treated. The infection can spread deeper into the body and eventually into your blood, which can make you feel ill. Treatment usually involves draining the abscess. What are the causes? An abscess occurs when germs, often bacteria, pass through your skin and cause an infection. This may be caused by:  A scrape or cut on your skin.  A puncture wound through your skin, including a needle injection.  Blocked oil or sweat glands.  Blocked and infected hair follicles.  A cyst that forms beneath your skin (sebaceous cyst) and becomes infected.  What increases the risk? This condition is more likely to develop in people who:  Have a weak body defense system (immune system).  Have diabetes.  Have dry and irritated skin.  Get frequent injections or use illegal IV drugs.  Have a foreign body in a wound, such as a splinter.  Have problems with their lymph system or veins.  What are the signs or symptoms? An abscess may start as a painful, firm bump under the skin. Over time, the abscess may get larger or become softer. Pus may appear at the top of the abscess, causing pressure and pain. It may eventually break through the skin and drain. Other symptoms include:  Redness.  Warmth.  Swelling.  Tenderness.  A sore on the skin.  How is this diagnosed? This condition is diagnosed based on your medical history and a physical exam. A sample of pus may be taken from the abscess to find out what is causing the infection and what antibiotics can be used to treat it. You also may have:  Blood tests to look for signs of infection or spread of an infection to  your blood.  Imaging studies such as ultrasound, CT scan, or MRI if the abscess is deep.  How is this treated? Small abscesses that drain on their own may not need treatment. Treatment for an abscess that does not rupture on its own may include:  Warm compresses applied to the area several times per day.  Incision and drainage. Your health care provider will make an incision to open the abscess and will remove pus and any foreign body or dead tissue. The incision area may be packed with gauze to keep it open for a few days while it heals.  Antibiotic medicines to treat infection. For a severe abscess, you may first get antibiotics through an IV and then change to oral antibiotics.  Follow these instructions at home: Abscess Care  If you have an abscess that has not drained, place a warm, clean, wet washcloth over the abscess several times a day. Do this as told by your health care provider.  Follow instructions from your health care provider about how to take care of your abscess. Make sure you: ? Cover the abscess with a bandage (dressing). ? Change your dressing or gauze as told by your health care provider. ? Wash your hands with soap and water before you change the dressing or gauze. If soap and water are not available, use hand sanitizer.  Check your abscess every day for signs of a worsening infection. Check for: ?   More redness, swelling, or pain. ? More fluid or blood. ? Warmth. ? More pus or a bad smell. Medicines  Take over-the-counter and prescription medicines only as told by your health care provider.  If you were prescribed an antibiotic medicine, take it as told by your health care provider. Do not stop taking the antibiotic even if you start to feel better. General instructions  To avoid spreading the infection: ? Do not share personal care items, towels, or hot tubs with others. ? Avoid making skin contact with other people.  Keep all follow-up visits as told by  your health care provider. This is important. Contact a health care provider if:  You have more redness, swelling, or pain around your abscess.  You have more fluid or blood coming from your abscess.  Your abscess feels warm to the touch.  You have more pus or a bad smell coming from your abscess.  You have a fever.  You have muscle aches.  You have chills or a general ill feeling. Get help right away if:  You have severe pain.  You see red streaks on your skin spreading away from the abscess. This information is not intended to replace advice given to you by your health care provider. Make sure you discuss any questions you have with your health care provider. Document Released: 04/08/2005 Document Revised: 02/23/2016 Document Reviewed: 05/08/2015 Elsevier Interactive Patient Education  2018 [employer]  

## 2017-07-27 ENCOUNTER — Telehealth: Payer: Self-pay | Admitting: Obstetrics & Gynecology

## 2017-07-27 NOTE — Telephone Encounter (Signed)
[facility] referring for H/O prior ablation treatment. Called and left voicemail for patient to call back to be schedule

## 2017-07-29 MED FILL — BRILINTA 90 MG TABLET: 90 | 30 days supply | Qty: 60 | Fill #3

## 2017-07-29 NOTE — Telephone Encounter (Signed)
Called and lvm for pt to call back to be schedule °

## 2017-08-02 MED FILL — hydrALAZINE HCL 50 MG TABS: 50 | 30 days supply | Qty: 90 | Fill #4

## 2017-08-02 NOTE — Telephone Encounter (Signed)
Called and lvm for pt to call back to be schedule °

## 2017-08-03 NOTE — Telephone Encounter (Signed)
Paperwork placed in B. Bhagat's box to address 1/24 (patient has OV).

## 2017-08-04 ENCOUNTER — Other Ambulatory Visit: Payer: Self-pay | Admitting: Family Medicine

## 2017-08-04 DIAGNOSIS — Z9109 Other allergy status, other than to drugs and biological substances: Secondary | ICD-10-CM

## 2017-08-04 MED ORDER — HYDROXYZINE HCL 50 MG PO TABS: 50.0000 mg | ORAL_TABLET | Freq: Three times a day (TID) | ORAL | 1 refills | Status: DC | PRN

## 2017-08-04 MED FILL — FERROUS SULFATE 325 MG TAB: 325 (65 FE) | 30 days supply | Qty: 60 | Fill #3

## 2017-08-04 MED FILL — OMEPRAZOLE DR 20 MG CAPSULE: 20 | 30 days supply | Qty: 30 | Fill #3

## 2017-08-04 MED FILL — MITIGARE 0.6 MG CAPSULE: 0.6 | 30 days supply | Qty: 30 | Fill #1

## 2017-08-04 MED FILL — AMLODIPINE BESYLATE 10 MG T: 10 | 30 days supply | Qty: 30 | Fill #3

## 2017-08-04 MED FILL — hydrOXYzine HCL 50 MG TABS: 50 | 30 days supply | Qty: 90 | Fill #0

## 2017-08-04 MED FILL — ISOSORBIDE MN ER 60 MG TAB: 60 | 30 days supply | Qty: 30 | Fill #3

## 2017-08-04 NOTE — Progress Notes (Signed)
Cardiology Office Note    Date:  08/05/2017   ID:  [name], DOB 1955-10-21, MRN [id number]  PCP:  [name], PA-C  Cardiologist:  Dr. Burt Knack  Chief Complaint: ER follow up   History of Present Illness:   [name] is a 62 y.o. female  DM, HTN, hyperlipidemia, CKD, HEP C, OSA, CAD, chronic diastolic CHF and mild AS presents for follow up.   Patient's cardiac history dates back to March 2018 when she presented with a non-ST elevation myocardial infarction. Echocardiogram showed ejection fraction 45-50% with septal and apical hypokinesis. She underwent cardiac catheterization and was found to have a 95% proximal LAD and she had PCI with a Synergy drug-eluting stent. Last echocardiogram August 2018 showed normal LV function, grade 1 diastolic dysfunction, mild aortic stenosis with mean gradient 13 mmHg, mild aortic insufficiency and mild left atrial enlargement. She had repeat cardiac catheterization October 2018 because of recurring chest pain. She was found to have a widely patent mid LAD stent. There was mild to moderate disease in her circumflex, obtuse marginal and diagonal. Left ventricular filling pressure was normal. Recommended medical therapy.   Last seen by Dr. Stanford Breed 06/02/2017 when patient presented for I&D of left thigh abscess and noted decreased hemoglobin. Previous LAD stent was in March or 9 months ago. Okay to hold brilinta but would resume when hemostasis achieved and continue for 12 months following prior PCI. Would continue ASA 81 mg daily uninterrupted. Continue statin.  Seen in ER 07/15/17 for SOB and weight gain. Given IV lasix and increased lasix for few days. Weight was 323lb.   Here today for follow up. She has lost 21lb since ER visit. Still has SOB with exertion, orthopnea and lower extremity edema.  She denies chest pain, palpitation, dizziness, melena or blood in his stool or urine.  Run out of some of her blood pressure medication week  ago.  Past Medical History:  Diagnosis Date  . Aortic stenosis    Echo 8/18: mean 13, peak 28, LVOT/AV mean velocity 0.51  . Asthma    As a child   . Bronchitis   . CAD (coronary artery disease)    a. 09/2016: 50% Ost 1st Mrg stenosis, 50% 2nd Mrg stenosis, 20% Mid-Cx, 95% Prox LAD, 40% mid-LAD, and 10% dist-LAD stenosis. Staged PCI with DES to Prox-LAD.   [name] Chronic combined systolic and diastolic CHF (congestive heart failure) ([place]) 2011   echo 2/18: EF 55-60, normal wall motion, grade 2 diastolic dysfunction, trivial AI // echo 3/18: Septal and apical HK, EF 45-50, normal wall motion, trivial AI, mild LAE, PASP 38 // echo 8/18: EF 60-65, normal wall motion, grade 1 diastolic dysfunction, calcified aortic valve leaflets, mild aortic stenosis (mean 13, peak 28, LVOT/AV mean velocity 0.51), mild AI, moderate MAC, mild LAE, trivial TR   . Complication of anesthesia   . Diabetes mellitus Dx 1989  . Hepatitis C Dx 2013  . Hypertension Dx 1989  . Obesity   . Pancreatitis 2013  . Refusal of blood transfusions as patient is Jehovah's Witness   . Tendinitis   . Ulcer 2010    Past Surgical History:  Procedure Laterality Date  . CHOLECYSTECTOMY    . CORONARY STENT INTERVENTION N/A 09/18/2016   Procedure: Coronary Stent Intervention;  Surgeon: Troy Sine, MD;  Location: [facility];  Service: Cardiovascular;  Laterality: N/A;  . EYE SURGERY    . KNEE ARTHROSCOPY    . LEFT  HEART CATH N/A 09/18/2016   Procedure: Left Heart Cath;  Surgeon: Troy Sine, MD;  Location: [facility];  Service: Cardiovascular;  Laterality: N/A;  . LEFT HEART CATH AND CORONARY ANGIOGRAPHY N/A 09/16/2016   Procedure: Left Heart Cath and Coronary Angiography;  Surgeon: Burnell Blanks, MD;  Location: [facility];  Service: Cardiovascular;  Laterality: N/A;  . LEFT HEART CATH AND CORONARY ANGIOGRAPHY N/A 04/29/2017   Procedure: LEFT HEART CATH AND CORONARY ANGIOGRAPHY;  Surgeon: Nelva Bush, MD;  Location: [facility];  Service: Cardiovascular;  Laterality: N/A;  . TUBAL LIGATION    . TUBAL LIGATION      Current Medications: Prior to Admission medications   Medication Sig Start Date End Date Taking? Authorizing Provider  acetaminophen-codeine (TYLENOL #3) 300-30 MG tablet Take 1 tablet by mouth every 12 (twelve) hours as needed for severe pain. For osteoarthritis (unable to take NSAIDs due to CKD) 04/14/17   Arnoldo Morale, MD  albuterol (PROVENTIL) (2.5 MG/3ML) 0.083% nebulizer solution Take 3 mLs (2.5 mg total) by nebulization every 6 (six) hours as needed for wheezing or shortness of breath. 12/17/16   Argentina Donovan, PA-C  albuterol (VENTOLIN HFA) 108 (90 Base) MCG/ACT inhaler Inhale 2 puffs into the lungs every 4 (four) hours as needed for wheezing or shortness of breath. 08/03/16   Boykin Nearing, MD  allopurinol (ZYLOPRIM) 100 MG tablet Take 1 tablet (100 mg total) by mouth daily. 02/12/17   Funches, Adriana Mccallum, MD  amLODipine (NORVASC) 10 MG tablet Take 1 tablet (10 mg total) by mouth daily. 04/14/17   Arnoldo Morale, MD  aspirin EC 81 MG EC tablet Take 1 tablet (81 mg total) by mouth daily. 09/19/16   Strader, Fransisco Hertz, PA-C  atorvastatin (LIPITOR) 80 MG tablet Take 1 tablet (80 mg total) by mouth daily at 6 PM. 02/12/17   Boykin Nearing, MD  Blood Glucose Monitoring Suppl (ACCU-CHEK AVIVA) device Use as instructed daily. 04/14/17   Arnoldo Morale, MD  carvedilol (COREG) 25 MG tablet TAKE 1 TABLET BY MOUTH 2 TIMES DAILY WITH A MEAL. 02/12/17   Funches, Adriana Mccallum, MD  docusate sodium (COLACE) 100 MG capsule Take 1 capsule (100 mg total) every 12 (twelve) hours by mouth. Patient not taking: Reported on 06/01/2017 05/25/17   Jola Schmidt, MD  doxycycline (VIBRA-TABS) 100 MG tablet Take 1 tablet (100 mg total) by mouth 2 (two) times daily. 07/23/17   [name], PA-C  Exenatide ER (BYDUREON BCISE) 2 MG/0.85ML AUIJ Inject 2 mg into the skin once a week. 07/23/17    [name], PA-C  ferrous sulfate 325 (65 FE) MG tablet Take 1 tablet (325 mg total) by mouth 2 (two) times daily with a meal. 04/14/17   Arnoldo Morale, MD  fluticasone (FLONASE) 50 MCG/ACT nasal spray Place 2 sprays into both nostrils daily as needed for allergies or rhinitis. 01/07/17   Funches, Adriana Mccallum, MD  Fluticasone-Salmeterol (ADVAIR DISKUS) 250-50 MCG/DOSE AEPB Inhale 1 puff into the lungs 2 (two) times daily. Patient taking differently: Inhale 1 puff into the lungs 2 (two) times daily as needed (shortness of breath).  12/17/16   Argentina Donovan, PA-C  furosemide (LASIX) 40 MG tablet Take 1 tablet (40 mg total) by mouth daily. 05/12/17 05/12/18  Lyda Jester M, PA-C  gabapentin (NEURONTIN) 100 MG capsule TAKE 2 CAPSULES BY MOUTH 3 TIMES A DAY. Patient taking differently: Take 200 mg by mouth 3 (three) times daily.  05/10/17  Arnoldo Morale, MD  glucose blood (ACCU-CHEK AVIVA) test strip Use as instructed daily 04/14/17   Arnoldo Morale, MD  glucose blood (TRUE METRIX BLOOD GLUCOSE TEST) test strip Use as instructed 03/19/17   Barrett, Evelene Croon, PA-C  hydrALAZINE (APRESOLINE) 50 MG tablet Take 1 tablet (50 mg total) by mouth 3 (three) times daily. 02/12/17   Funches, Adriana Mccallum, MD  HYDROcodone-acetaminophen (NORCO/VICODIN) 5-325 MG tablet Take 1 tablet every 4 (four) hours as needed by mouth for moderate pain. Patient not taking: Reported on 06/18/2017 05/24/17   Jola Schmidt, MD  hydrOXYzine (ATARAX/VISTARIL) 50 MG tablet TAKE 1 TABLET (50 MG TOTAL) BY MOUTH 3 (THREE) TIMES DAILY AS NEEDED. Patient taking differently: Take 50 mg by mouth 3 (three) times daily as needed for itching.  03/02/17   Tresa Garter, MD  insulin aspart (NOVOLOG) 100 UNIT/ML FlexPen 1-12 units with meals as needed per sliding scale instructions 07/23/17   [name], PA-C  Insulin Glargine (LANTUS SOLOSTAR) 100 UNIT/ML Solostar Pen Inject 40 Units into the skin 2 (two) times daily. 07/23/17    [name], PA-C  isosorbide mononitrate (IMDUR) 60 MG 24 hr tablet Take 1 tablet (60 mg total) by mouth daily. 05/05/17   Lyda Jester M, PA-C  ketotifen (ZADITOR) 0.025 % ophthalmic solution Place 1 drop into both eyes daily as needed (for dry eyes). 07/27/16   Boykin Nearing, MD  Lancet Devices [facility]) lancets Use as instructed daily. 04/14/17   Arnoldo Morale, MD  levofloxacin (LEVAQUIN) 750 MG tablet Take 1 tablet (750 mg total) by mouth every other day. Patient not taking: Reported on 06/18/2017 06/08/17   Arnoldo Morale, MD  MITIGARE 0.6 MG CAPS Take 1 capsule by mouth daily. 06/30/17   Arnoldo Morale, MD  nitroGLYCERIN (NITROSTAT) 0.4 MG SL tablet Place 1 tablet (0.4 mg total) under the tongue every 5 (five) minutes x 3 doses as needed for chest pain. 03/19/17   Barrett, Evelene Croon, PA-C  omeprazole (PRILOSEC) 20 MG capsule Take 1 capsule (20 mg total) by mouth daily. 04/14/17   Arnoldo Morale, MD  oxyCODONE-acetaminophen (PERCOCET/ROXICET) 5-325 MG tablet Take 1 tablet by mouth every 6 (six) hours as needed for severe pain. 06/18/17   Charlann Lange, PA-C  PEDIALYTE (PEDIALYTE) SOLN Take 240 mLs by mouth every 6 (six) hours.     [provider]  pregabalin (LYRICA) 100 MG capsule Take 1 capsule (100 mg total) by mouth 3 (three) times daily. 05/03/17   Arnoldo Morale, MD  ticagrelor (BRILINTA) 90 MG TABS tablet Take 1 tablet (90 mg total) by mouth 2 (two) times daily. 05/05/17   Lyda Jester M, PA-C  valACYclovir (VALTREX) 500 MG tablet Take 1 tablet (500 mg total) by mouth daily. 04/14/17   Arnoldo Morale, MD    Allergies:   Shellfish allergy; Diazepam; and Morphine and related   Social History   Socioeconomic History  . Marital status: Divorced    Spouse name: None  . Number of children: None  . Years of education: None  . Highest education level: None  Social Needs  . Financial resource strain: None  . Food insecurity - worry: None  . Food  insecurity - inability: None  . Transportation needs - medical: None  . Transportation needs - non-medical: None  Occupational History  . None  Tobacco Use  . Smoking status: Former Smoker    Last attempt to quit: 10/25/1980    Years since quitting: 36.8  . Smokeless tobacco: Never Used  .  Tobacco comment: quit smoking in 1982  Substance and Sexual Activity  . Alcohol use: Yes    Comment: 3 times in last year  . 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 Sexual activity: None    Comment: Not asked  Other Topics Concern  . None  Social History Narrative  . None     Family History:  The patient's family history includes Colon cancer in her mother; Diabetes in her paternal grandmother; Heart attack in her maternal grandmother and other; Hypertension in her brother and sister.   ROS:   Please see the history of present illness.    ROS All other systems reviewed and are negative.   PHYSICAL EXAM:   VS:  BP (!) 158/70   Pulse 80   Ht _0  (1.727 m)   Wt (!) 302 lb 12.8 oz (137.3 kg)   BMI 46.04 kg/m    GEN: Well nourished, well developed, in no acute distress  HEENT: normal  Neck: no JVD, carotid bruits, or masses Cardiac: RRR; no murmurs, rubs, or gallops, Trace to 1+ edema  Respiratory: faint rales GI: soft, nontender, nondistended, + BS MS: no deformity or atrophy  Skin: warm and dry, no rash Neuro:  Alert and Oriented x 3, Strength and sensation are intact Psych: euthymic mood, full affect  Wt Readings from Last 3 Encounters:  08/05/17 (!) 302 lb 12.8 oz (137.3 kg)  07/23/17 (!) 311 lb (141.1 kg)  07/15/17 (!) 323 lb (146.5 kg)      Studies/Labs Reviewed:   EKG:  EKG is not ordered today.    Recent Labs: 09/13/2016: TSH 1.860 11/10/2016: NT-Pro BNP 202 12/13/2016: Magnesium 2.2 03/17/2017: ALT 25 07/15/2017: B Natriuretic Peptide 110.1; BUN 66; Creatinine, Ser 2.18; Hemoglobin 10.1; Platelets 214; Potassium 4.1; Sodium 140   Lipid Panel     Component Value Date/Time   CHOL 122 09/13/2016 1245   TRIG 168 (H) 09/13/2016 1245   HDL 36 (L) 09/13/2016 1245   CHOLHDL 3.4 09/13/2016 1245   VLDL 34 09/13/2016 1245   LDLCALC 52 09/13/2016 1245    Additional studies/ records that were reviewed today include:   Echocardiogram: 02/2017 Study Conclusions  - Left ventricle: The cavity size was normal. Wall thickness was   normal. Systolic function was normal. The estimated ejection   fraction was in the range of 60% to 65%. Wall motion was normal;   there were no regional wall motion abnormalities. Doppler   parameters are consistent with abnormal left ventricular   relaxation (grade 1 diastolic dysfunction). - Aortic valve: Trileaflet; mildly thickened, mildly calcified   leaflets. There was mild stenosis. There was mild regurgitation.   Peak velocity (S): 265 cm/s. Mean gradient (S): 13 mm Hg. Valve   area (VTI): 1.82 cm^2. Valve area (Vmax): 1.25 cm^2. Valve area   (Vmean): 1.45 cm^2. - Mitral valve: Moderately calcified annulus. - Left atrium: The atrium was mildly dilated. - Tricuspid valve: There was trivial regurgitation.  Cardiac Catheterization: 04/2017 LEFT HEART CATH AND CORONARY ANGIOGRAPHY  Conclusion   Conclusions: 1. Widely patent mid LAD stent. 2. No significant change in mild to moderate LCx, OM, and diagonal disease compared with 09/2016. 3. Normal left ventricular filling pressure.  Recommendations: 1. Continue medical therapy and secondary prevention. I will restart isosorbide mononitrate at 90 mg daily with hope of weaning off NTG infusion later today. 2. Gentle hydration given CKD and normal LVEDP. Hold furosemide today and reassess volume status  and renal function tomorrow before restarting diuretic therapy.       ASSESSMENT & PLAN:    4. CAD - Last cath 04/2017 as above. No chest pain. Continue ASA, statin, BB and Imdur.  2. Acute on chronic diastolic CHF - Weight down 21lb since ER  visit. Still volume overloaded on exam. She is unsure about current dose of lasix. Advised to continue it. Check BMP and BNP. Compliant with low sodium diet.   3. Mild AS per echo 02/2017 - Repeat in 1 year  4. HLD - 09/13/2016: Cholesterol 122; HDL 36; LDL Cholesterol 52; Triglycerides 168; VLDL 34  - LDL at goal. Continue statin  5. HTN - Elevated as she run out of some antihypertensive week ago. Resume medications.   Encouraged to bring all medication during next OV.     Medication Adjustments/Labs and Tests Ordered: Current medicines are reviewed at length with the patient today.  Concerns regarding medicines are outlined above.  Medication changes, Labs and Tests ordered today are listed in the Patient Instructions below. Patient Instructions  Medication Instructions:   Your physician recommends that you continue on your current medications as directed. Please refer to the Current Medication list given to you today.    Labwork:  TODAY--BMET AND PRO-BNP     Follow-Up:  3 MONTHS WITH DR Burt Knack       If you need a refill on your cardiac medications before your next appointment, please call your pharmacy.      Jarrett Soho, [place]  08/05/2017 4:21 PM    [facility] [place], [place],   [address] Phone: [phone]; Fax: [phone]

## 2017-08-05 ENCOUNTER — Encounter: Payer: Self-pay | Admitting: Podiatry

## 2017-08-05 ENCOUNTER — Encounter: Payer: Self-pay | Admitting: Physician Assistant

## 2017-08-05 ENCOUNTER — Ambulatory Visit (INDEPENDENT_AMBULATORY_CARE_PROVIDER_SITE_OTHER): Payer: Medicaid Other | Admitting: Physician Assistant

## 2017-08-05 ENCOUNTER — Ambulatory Visit: Payer: Medicaid Other | Admitting: Podiatry

## 2017-08-05 ENCOUNTER — Telehealth: Payer: Self-pay | Admitting: Cardiovascular Disease

## 2017-08-05 VITALS — BP 158/70 | HR 80 | Ht 68.0 in | Wt 302.8 lb

## 2017-08-05 DIAGNOSIS — M79675 Pain in left toe(s): Principal | ICD-10-CM

## 2017-08-05 DIAGNOSIS — I5042 Chronic combined systolic (congestive) and diastolic (congestive) heart failure: Secondary | ICD-10-CM | POA: Diagnosis not present

## 2017-08-05 DIAGNOSIS — B351 Tinea unguium: Secondary | ICD-10-CM | POA: Diagnosis not present

## 2017-08-05 DIAGNOSIS — E1149 Type 2 diabetes mellitus with other diabetic neurological complication: Secondary | ICD-10-CM

## 2017-08-05 DIAGNOSIS — I2510 Atherosclerotic heart disease of native coronary artery without angina pectoris: Secondary | ICD-10-CM

## 2017-08-05 DIAGNOSIS — E782 Mixed hyperlipidemia: Secondary | ICD-10-CM

## 2017-08-05 DIAGNOSIS — I10 Essential (primary) hypertension: Secondary | ICD-10-CM | POA: Diagnosis not present

## 2017-08-05 DIAGNOSIS — I5033 Acute on chronic diastolic (congestive) heart failure: Secondary | ICD-10-CM

## 2017-08-05 DIAGNOSIS — M79676 Pain in unspecified toe(s): Secondary | ICD-10-CM

## 2017-08-05 DIAGNOSIS — E1140 Type 2 diabetes mellitus with diabetic neuropathy, unspecified: Secondary | ICD-10-CM

## 2017-08-05 DIAGNOSIS — M79674 Pain in right toe(s): Secondary | ICD-10-CM

## 2017-08-05 NOTE — Progress Notes (Signed)
Subjective:   Patient ID: [name], female   DOB: 62 y.o.   MRN: [id number]   HPI Patient presents with severely thickened nailbeds of both feet and does have diabetes that is not good control.  States that it is very hard for her to cut them there are thick yellow and brittle.  Patient does not smoke currently and likes to be active and is trouble wearing shoes   Review of Systems  All other systems reviewed and are negative.       Objective:  Physical Exam  Constitutional: She appears well-developed and well-nourished.  Cardiovascular: Intact distal pulses.  Pulmonary/Chest: Effort normal.  Musculoskeletal: Normal range of motion.  Neurological: She is alert.  Skin: Skin is warm.  Nursing note and vitals reviewed.   Neurovascular status was found to be intact muscle strength was adequate with diminishment of sharp dull vibratory.  Patient is noted to have severe nail disease 1-5 both feet with thick yellow brittle nails and is noted to have discomfort with palpation was found to have good digital perfusion well oriented x3     Assessment:  At risk diabetic with mycotic nail infection and skin infection left over right with pain in the nailbeds H&P all conditions reviewed and debridement of nailbeds accomplished with no iatrogenic bleeding noted.     Plan:  Advised this patient on antifungal therapy placed on Lamisil but first will get approval from Dr. and I have asked that he actually write the prescription given the amount of prescription she takes

## 2017-08-05 NOTE — Patient Instructions (Signed)
Medication Instructions:   Your physician recommends that you continue on your current medications as directed. Please refer to the Current Medication list given to you today.    Labwork:  TODAY--BMET AND PRO-BNP     Follow-Up:  3 MONTHS WITH DR Burt Knack       If you need a refill on your cardiac medications before your next appointment, please call your pharmacy.

## 2017-08-05 NOTE — Telephone Encounter (Signed)
[name], PCP is to complete paperwork.  Left message for [name] that patient will need to have PCP fill out paperwork for monitoring. Instructed her to call with questions or concerns.

## 2017-08-05 NOTE — Telephone Encounter (Signed)
New Message   [name] is calling to check on the status of the chronic disease form. Please call

## 2017-08-05 NOTE — Progress Notes (Signed)
   Subjective:    Patient ID: [name], female    DOB: 12/27/1955, 62 y.o.   MRN: [id number]  HPI    Review of Systems  All other systems reviewed and are negative.      Objective:   Physical Exam        Assessment & Plan:

## 2017-08-06 ENCOUNTER — Telehealth: Payer: Self-pay | Admitting: *Deleted

## 2017-08-06 DIAGNOSIS — I2510 Atherosclerotic heart disease of native coronary artery without angina pectoris: Secondary | ICD-10-CM

## 2017-08-06 DIAGNOSIS — I5033 Acute on chronic diastolic (congestive) heart failure: Secondary | ICD-10-CM

## 2017-08-06 LAB — BASIC METABOLIC PANEL
BUN/Creatinine Ratio: 26 — ABNORMAL HIGH (ref 9–23)
BUN: 47 mg/dL — ABNORMAL HIGH (ref 6–24)
CO2: 22 mmol/L (ref 20–29)
Calcium: 9.1 mg/dL (ref 8.7–10.2)
Chloride: 96 mmol/L (ref 96–106)
Creatinine, Ser: 1.81 mg/dL — ABNORMAL HIGH (ref 0.57–1.00)
GFR calc Af Amer: 35 mL/min/{1.73_m2} — ABNORMAL LOW (ref >59)
GFR calc non Af Amer: 30 mL/min/{1.73_m2} — ABNORMAL LOW (ref >59)
Glucose: 607 mg/dL (ref 65–99)
Potassium: 4.4 mmol/L (ref 3.5–5.2)
Sodium: 135 mmol/L (ref 134–144)

## 2017-08-06 LAB — PRO B NATRIURETIC PEPTIDE: NT-Pro BNP: 401 pg/mL — ABNORMAL HIGH (ref 0–287)

## 2017-08-06 NOTE — Telephone Encounter (Signed)
I spoke with pt and reviewed lab results and recommendations regarding glucose with her.  She does check blood sugar at home. Pt reports home dose of furosemide is 80 mg in the AM and 40 mg in the PM.  She is not taking potassium

## 2017-08-06 NOTE — Telephone Encounter (Signed)
I would continue current therapy and recheck BNP and BMP in 10 days.

## 2017-08-06 NOTE — Telephone Encounter (Signed)
Received call from [employer] of critical glucose level. Reviewed with Dr. Marlou Porch who recommends pt be notified of elevated glucose. She should follow her treatment plan for elevated glucose and contact primary care for possible adjustment of medications.  I placed call to pt and left message to call office

## 2017-08-06 NOTE — Telephone Encounter (Signed)
Reviewed V. Bhagat's advice with patient who verbalized understanding. She is scheduled for repeat lab work on 2/6. I advised her to call back with questions or concerns prior to follow-up. She thanked me for the call.

## 2017-08-10 MED FILL — TRUEPLUS PEN NDL 31GX5/16: 31G X 8 MM | 30 days supply | Qty: 100 | Fill #1

## 2017-08-10 MED FILL — TRUEPLUS PEN NDL 31GX5/16": 31G X 8 MM | 30 days supply | Qty: 100 | Fill #1

## 2017-08-11 ENCOUNTER — Ambulatory Visit (INDEPENDENT_AMBULATORY_CARE_PROVIDER_SITE_OTHER): Payer: Medicaid Other | Admitting: Physician Assistant

## 2017-08-11 ENCOUNTER — Encounter: Payer: Self-pay | Admitting: Radiology

## 2017-08-11 ENCOUNTER — Inpatient Hospital Stay: Payer: Medicaid Other

## 2017-08-11 ENCOUNTER — Inpatient Hospital Stay
Admission: AD | Admit: 2017-08-11 | Discharge: 2017-08-19 | DRG: 570 | Disposition: A | Discharge status: Home Health | Payer: Medicaid Other | Source: Ambulatory Visit | Attending: Internal Medicine | Admitting: Internal Medicine

## 2017-08-11 ENCOUNTER — Encounter: Payer: Self-pay | Admitting: Physician Assistant

## 2017-08-11 ENCOUNTER — Telehealth: Payer: Self-pay | Admitting: *Deleted

## 2017-08-11 ENCOUNTER — Other Ambulatory Visit: Payer: Self-pay

## 2017-08-11 VITALS — BP 128/70 | HR 84 | Temp 98.2°F | Resp 16 | Wt 301.0 lb

## 2017-08-11 DIAGNOSIS — I130 Hypertensive heart and chronic kidney disease with heart failure and stage 1 through stage 4 chronic kidney disease, or unspecified chronic kidney disease: Secondary | ICD-10-CM | POA: Diagnosis present

## 2017-08-11 DIAGNOSIS — Z885 Allergy status to narcotic agent status: Secondary | ICD-10-CM

## 2017-08-11 DIAGNOSIS — I5042 Chronic combined systolic (congestive) and diastolic (congestive) heart failure: Secondary | ICD-10-CM | POA: Diagnosis present

## 2017-08-11 DIAGNOSIS — Z833 Family history of diabetes mellitus: Secondary | ICD-10-CM

## 2017-08-11 DIAGNOSIS — Z9851 Tubal ligation status: Secondary | ICD-10-CM

## 2017-08-11 DIAGNOSIS — Z87892 Personal history of anaphylaxis: Secondary | ICD-10-CM

## 2017-08-11 DIAGNOSIS — I350 Nonrheumatic aortic (valve) stenosis: Secondary | ICD-10-CM | POA: Diagnosis present

## 2017-08-11 DIAGNOSIS — E118 Type 2 diabetes mellitus with unspecified complications: Secondary | ICD-10-CM

## 2017-08-11 DIAGNOSIS — N184 Chronic kidney disease, stage 4 (severe): Secondary | ICD-10-CM

## 2017-08-11 DIAGNOSIS — E1140 Type 2 diabetes mellitus with diabetic neuropathy, unspecified: Secondary | ICD-10-CM | POA: Diagnosis present

## 2017-08-11 DIAGNOSIS — K219 Gastro-esophageal reflux disease without esophagitis: Secondary | ICD-10-CM | POA: Diagnosis present

## 2017-08-11 DIAGNOSIS — Z794 Long term (current) use of insulin: Secondary | ICD-10-CM

## 2017-08-11 DIAGNOSIS — Z888 Allergy status to other drugs, medicaments and biological substances status: Secondary | ICD-10-CM

## 2017-08-11 DIAGNOSIS — E875 Hyperkalemia: Secondary | ICD-10-CM | POA: Diagnosis not present

## 2017-08-11 DIAGNOSIS — E1122 Type 2 diabetes mellitus with diabetic chronic kidney disease: Secondary | ICD-10-CM | POA: Diagnosis present

## 2017-08-11 DIAGNOSIS — G4733 Obstructive sleep apnea (adult) (pediatric): Secondary | ICD-10-CM | POA: Diagnosis present

## 2017-08-11 DIAGNOSIS — Z8249 Family history of ischemic heart disease and other diseases of the circulatory system: Secondary | ICD-10-CM | POA: Diagnosis not present

## 2017-08-11 DIAGNOSIS — IMO0002 Reserved for concepts with insufficient information to code with codable children: Secondary | ICD-10-CM

## 2017-08-11 DIAGNOSIS — N170 Acute kidney failure with tubular necrosis: Secondary | ICD-10-CM | POA: Diagnosis not present

## 2017-08-11 DIAGNOSIS — Z955 Presence of coronary angioplasty implant and graft: Secondary | ICD-10-CM | POA: Diagnosis not present

## 2017-08-11 DIAGNOSIS — I25119 Atherosclerotic heart disease of native coronary artery with unspecified angina pectoris: Secondary | ICD-10-CM | POA: Diagnosis present

## 2017-08-11 DIAGNOSIS — L03116 Cellulitis of left lower limb: Secondary | ICD-10-CM | POA: Diagnosis not present

## 2017-08-11 DIAGNOSIS — B182 Chronic viral hepatitis C: Secondary | ICD-10-CM

## 2017-08-11 DIAGNOSIS — E785 Hyperlipidemia, unspecified: Secondary | ICD-10-CM | POA: Diagnosis present

## 2017-08-11 DIAGNOSIS — Z800 Family history of malignant neoplasm of digestive organs: Secondary | ICD-10-CM

## 2017-08-11 DIAGNOSIS — Z87891 Personal history of nicotine dependence: Secondary | ICD-10-CM | POA: Diagnosis not present

## 2017-08-11 DIAGNOSIS — Z91013 Allergy to seafood: Secondary | ICD-10-CM

## 2017-08-11 DIAGNOSIS — R0600 Dyspnea, unspecified: Secondary | ICD-10-CM

## 2017-08-11 DIAGNOSIS — E1165 Type 2 diabetes mellitus with hyperglycemia: Secondary | ICD-10-CM

## 2017-08-11 DIAGNOSIS — Z7902 Long term (current) use of antithrombotics/antiplatelets: Secondary | ICD-10-CM

## 2017-08-11 DIAGNOSIS — L02416 Cutaneous abscess of left lower limb: Secondary | ICD-10-CM | POA: Diagnosis present

## 2017-08-11 DIAGNOSIS — D649 Anemia, unspecified: Secondary | ICD-10-CM | POA: Diagnosis not present

## 2017-08-11 DIAGNOSIS — L7631 Postprocedural hematoma of skin and subcutaneous tissue following a dermatologic procedure: Secondary | ICD-10-CM | POA: Diagnosis not present

## 2017-08-11 DIAGNOSIS — N183 Chronic kidney disease, stage 3 (moderate): Secondary | ICD-10-CM | POA: Diagnosis present

## 2017-08-11 DIAGNOSIS — Z8711 Personal history of peptic ulcer disease: Secondary | ICD-10-CM

## 2017-08-11 DIAGNOSIS — Z6841 Body Mass Index (BMI) 40.0 and over, adult: Secondary | ICD-10-CM | POA: Diagnosis not present

## 2017-08-11 DIAGNOSIS — Z7982 Long term (current) use of aspirin: Secondary | ICD-10-CM

## 2017-08-11 DIAGNOSIS — I5082 Biventricular heart failure: Secondary | ICD-10-CM | POA: Diagnosis present

## 2017-08-11 LAB — GLUCOSE, CAPILLARY
Glucose-Capillary: 407 mg/dL — ABNORMAL HIGH (ref 65–99)
Glucose-Capillary: 446 mg/dL — ABNORMAL HIGH (ref 65–99)
Glucose-Capillary: 350 mg/dL — ABNORMAL HIGH (ref 65–99)
Glucose-Capillary: 285 mg/dL — ABNORMAL HIGH (ref 65–99)

## 2017-08-11 LAB — APTT: aPTT: 28 seconds (ref 24–36)

## 2017-08-11 LAB — CBC WITH DIFFERENTIAL/PLATELET
Basophils Absolute: 0.1 10*3/uL (ref 0–0.1)
Basophils Relative: 1 %
Eosinophils Absolute: 0.3 10*3/uL (ref 0–0.7)
Eosinophils Relative: 5 %
HCT: 34.1 % — ABNORMAL LOW (ref 35.0–47.0)
Hemoglobin: 11 g/dL — ABNORMAL LOW (ref 12.0–16.0)
Lymphocytes Relative: 21 %
Lymphs Abs: 1.2 10*3/uL (ref 1.0–3.6)
MCH: 27.2 pg (ref 26.0–34.0)
MCHC: 32.2 g/dL (ref 32.0–36.0)
MCV: 84.3 fL (ref 80.0–100.0)
Monocytes Absolute: 0.5 10*3/uL (ref 0.2–0.9)
Monocytes Relative: 9 %
Neutro Abs: 3.7 10*3/uL (ref 1.4–6.5)
Neutrophils Relative %: 64 %
Platelets: 238 10*3/uL (ref 150–440)
RBC: 4.05 MIL/uL (ref 3.80–5.20)
RDW: 16.2 % — ABNORMAL HIGH (ref 11.5–14.5)
WBC: 5.7 10*3/uL (ref 3.6–11.0)

## 2017-08-11 LAB — COMPREHENSIVE METABOLIC PANEL
ALT: 36 U/L (ref 14–54)
AST: 49 U/L — ABNORMAL HIGH (ref 15–41)
Albumin: 3.4 g/dL — ABNORMAL LOW (ref 3.5–5.0)
Alkaline Phosphatase: 260 U/L — ABNORMAL HIGH (ref 38–126)
Anion gap: 12 (ref 5–15)
BUN: 50 mg/dL — ABNORMAL HIGH (ref 6–20)
CO2: 22 mmol/L (ref 22–32)
Calcium: 8.5 mg/dL — ABNORMAL LOW (ref 8.9–10.3)
Chloride: 101 mmol/L (ref 101–111)
Creatinine, Ser: 1.95 mg/dL — ABNORMAL HIGH (ref 0.44–1.00)
GFR calc Af Amer: 31 mL/min — ABNORMAL LOW (ref >60)
GFR calc non Af Amer: 27 mL/min — ABNORMAL LOW (ref >60)
Glucose, Bld: 422 mg/dL — ABNORMAL HIGH (ref 65–99)
Potassium: 3.8 mmol/L (ref 3.5–5.1)
Sodium: 135 mmol/L (ref 135–145)
Total Bilirubin: 0.5 mg/dL (ref 0.3–1.2)
Total Protein: 7.3 g/dL (ref 6.5–8.1)

## 2017-08-11 LAB — PROTIME-INR
INR: 0.9
Prothrombin Time: 12.1 seconds (ref 11.4–15.2)

## 2017-08-11 LAB — HEMOGLOBIN A1C
Hgb A1c MFr Bld: 9.1 % — ABNORMAL HIGH (ref 4.8–5.6)
Mean Plasma Glucose: 214.47 mg/dL

## 2017-08-11 LAB — TSH: TSH: 1.444 u[IU]/mL (ref 0.350–4.500)

## 2017-08-11 MED ORDER — AMLODIPINE BESYLATE 10 MG PO TABS
10.0000 mg | ORAL_TABLET | Freq: Every day | ORAL | Status: DC
  Administered 2017-08-11 – 2017-08-19 (×8): 10 mg via ORAL
  Filled 2017-08-11 (×8): qty 1

## 2017-08-11 MED ORDER — SODIUM CHLORIDE 0.9% FLUSH: 3.0000 mL | INTRAVENOUS | Status: DC | PRN

## 2017-08-11 MED ORDER — HYDRALAZINE HCL 20 MG/ML IJ SOLN: 10.0000 mg | Freq: Four times a day (QID) | INTRAMUSCULAR | Status: DC | PRN

## 2017-08-11 MED ORDER — HYDRALAZINE HCL 50 MG PO TABS
50.0000 mg | ORAL_TABLET | Freq: Three times a day (TID) | ORAL | Status: DC
  Administered 2017-08-11 – 2017-08-19 (×21): 50 mg via ORAL
  Filled 2017-08-11 (×21): qty 1

## 2017-08-11 MED ORDER — INSULIN ASPART 100 UNIT/ML ~~LOC~~ SOLN
0.0000 [IU] | Freq: Three times a day (TID) | SUBCUTANEOUS | Status: DC
  Administered 2017-08-11: 7 [IU] via SUBCUTANEOUS
  Filled 2017-08-11: qty 1

## 2017-08-11 MED ORDER — VANCOMYCIN HCL 10 G IV SOLR
2000.0000 mg | INTRAVENOUS | Status: DC
  Administered 2017-08-11: 2000 mg via INTRAVENOUS
  Filled 2017-08-11 (×2): qty 2000

## 2017-08-11 MED ORDER — ONDANSETRON HCL 4 MG/2ML IJ SOLN: 4.0000 mg | Freq: Four times a day (QID) | INTRAMUSCULAR | Status: DC | PRN

## 2017-08-11 MED ORDER — BISACODYL 5 MG PO TBEC: 5.0000 mg | DELAYED_RELEASE_TABLET | Freq: Every day | ORAL | Status: DC | PRN

## 2017-08-11 MED ORDER — INSULIN ASPART 100 UNIT/ML ~~LOC~~ SOLN: 0.0000 [IU] | Freq: Every day | SUBCUTANEOUS | Status: DC

## 2017-08-11 MED ORDER — MOMETASONE FURO-FORMOTEROL FUM 200-5 MCG/ACT IN AERO
2.0000 | INHALATION_SPRAY | Freq: Two times a day (BID) | RESPIRATORY_TRACT | Status: DC
  Administered 2017-08-11 – 2017-08-19 (×13): 2 via RESPIRATORY_TRACT
  Filled 2017-08-11: qty 8.8

## 2017-08-11 MED ORDER — ATORVASTATIN CALCIUM 20 MG PO TABS
80.0000 mg | ORAL_TABLET | Freq: Every day | ORAL | Status: DC
  Administered 2017-08-11 – 2017-08-18 (×7): 80 mg via ORAL
  Filled 2017-08-11 (×7): qty 4

## 2017-08-11 MED ORDER — PREGABALIN 50 MG PO CAPS
100.0000 mg | ORAL_CAPSULE | Freq: Three times a day (TID) | ORAL | Status: DC
  Administered 2017-08-11 – 2017-08-15 (×11): 100 mg via ORAL
  Filled 2017-08-11 (×12): qty 2

## 2017-08-11 MED ORDER — HYDROXYZINE HCL 25 MG PO TABS
50.0000 mg | ORAL_TABLET | Freq: Three times a day (TID) | ORAL | Status: DC | PRN
  Administered 2017-08-16 – 2017-08-18 (×3): 50 mg via ORAL
  Filled 2017-08-11 (×3): qty 2

## 2017-08-11 MED ORDER — ALLOPURINOL 100 MG PO TABS
100.0000 mg | ORAL_TABLET | Freq: Every day | ORAL | Status: DC
  Administered 2017-08-11 – 2017-08-19 (×9): 100 mg via ORAL
  Filled 2017-08-11 (×9): qty 1

## 2017-08-11 MED ORDER — KETOTIFEN FUMARATE 0.025 % OP SOLN
1.0000 [drp] | Freq: Every day | OPHTHALMIC | Status: DC | PRN
  Filled 2017-08-11: qty 5

## 2017-08-11 MED ORDER — FUROSEMIDE 40 MG PO TABS
40.0000 mg | ORAL_TABLET | Freq: Every day | ORAL | Status: DC
  Administered 2017-08-11 – 2017-08-13 (×2): 40 mg via ORAL
  Filled 2017-08-11 (×2): qty 1

## 2017-08-11 MED ORDER — TICAGRELOR 90 MG PO TABS
90.0000 mg | ORAL_TABLET | Freq: Two times a day (BID) | ORAL | Status: DC
  Administered 2017-08-11 – 2017-08-19 (×16): 90 mg via ORAL
  Filled 2017-08-11 (×18): qty 1

## 2017-08-11 MED ORDER — ISOSORBIDE MONONITRATE ER 30 MG PO TB24
60.0000 mg | ORAL_TABLET | Freq: Every day | ORAL | Status: DC
  Administered 2017-08-11 – 2017-08-19 (×8): 60 mg via ORAL
  Filled 2017-08-11 (×8): qty 2

## 2017-08-11 MED ORDER — NITROGLYCERIN 0.4 MG SL SUBL: 0.4000 mg | SUBLINGUAL_TABLET | SUBLINGUAL | Status: DC | PRN

## 2017-08-11 MED ORDER — SODIUM CHLORIDE 0.9% FLUSH
3.0000 mL | Freq: Two times a day (BID) | INTRAVENOUS | Status: DC
  Administered 2017-08-11 – 2017-08-19 (×15): 3 mL via INTRAVENOUS

## 2017-08-11 MED ORDER — DOCUSATE SODIUM 100 MG PO CAPS
100.0000 mg | ORAL_CAPSULE | Freq: Two times a day (BID) | ORAL | Status: DC
  Administered 2017-08-11 – 2017-08-19 (×13): 100 mg via ORAL
  Filled 2017-08-11 (×15): qty 1

## 2017-08-11 MED ORDER — FERROUS SULFATE 325 (65 FE) MG PO TABS
325.0000 mg | ORAL_TABLET | Freq: Two times a day (BID) | ORAL | Status: DC
  Administered 2017-08-11 – 2017-08-19 (×15): 325 mg via ORAL
  Filled 2017-08-11 (×15): qty 1

## 2017-08-11 MED ORDER — GABAPENTIN 100 MG PO CAPS
200.0000 mg | ORAL_CAPSULE | Freq: Three times a day (TID) | ORAL | Status: DC
  Administered 2017-08-11 – 2017-08-19 (×22): 200 mg via ORAL
  Filled 2017-08-11 (×23): qty 2

## 2017-08-11 MED ORDER — INSULIN ASPART 100 UNIT/ML ~~LOC~~ SOLN
0.0000 [IU] | Freq: Every day | SUBCUTANEOUS | Status: DC
  Administered 2017-08-11: 3 [IU] via SUBCUTANEOUS
  Administered 2017-08-13 – 2017-08-15 (×3): 2 [IU] via SUBCUTANEOUS
  Administered 2017-08-16: 4 [IU] via SUBCUTANEOUS
  Filled 2017-08-11 (×4): qty 1

## 2017-08-11 MED ORDER — PANTOPRAZOLE SODIUM 40 MG PO TBEC
40.0000 mg | DELAYED_RELEASE_TABLET | Freq: Every day | ORAL | Status: DC
  Administered 2017-08-11 – 2017-08-19 (×8): 40 mg via ORAL
  Filled 2017-08-11 (×8): qty 1

## 2017-08-11 MED ORDER — SODIUM CHLORIDE 0.9 % IV SOLN: 250.0000 mL | INTRAVENOUS | Status: DC | PRN

## 2017-08-11 MED ORDER — ALBUTEROL SULFATE (2.5 MG/3ML) 0.083% IN NEBU
2.5000 mg | INHALATION_SOLUTION | RESPIRATORY_TRACT | Status: DC | PRN
  Administered 2017-08-15 – 2017-08-17 (×4): 2.5 mg via RESPIRATORY_TRACT
  Filled 2017-08-11 (×5): qty 3

## 2017-08-11 MED ORDER — KETOROLAC TROMETHAMINE 30 MG/ML IJ SOLN
30.0000 mg | Freq: Four times a day (QID) | INTRAMUSCULAR | Status: DC | PRN
  Administered 2017-08-11 – 2017-08-13 (×4): 30 mg via INTRAVENOUS
  Filled 2017-08-11 (×4): qty 1

## 2017-08-11 MED ORDER — FLUTICASONE PROPIONATE 50 MCG/ACT NA SUSP
2.0000 | Freq: Every day | NASAL | Status: DC | PRN
  Filled 2017-08-11: qty 16

## 2017-08-11 MED ORDER — INSULIN ASPART 100 UNIT/ML ~~LOC~~ SOLN
20.0000 [IU] | Freq: Once | SUBCUTANEOUS | Status: AC
  Administered 2017-08-11: 20 [IU] via SUBCUTANEOUS
  Filled 2017-08-11: qty 1

## 2017-08-11 MED ORDER — INSULIN ASPART 100 UNIT/ML ~~LOC~~ SOLN
0.0000 [IU] | Freq: Three times a day (TID) | SUBCUTANEOUS | Status: DC
  Administered 2017-08-12: 8 [IU] via SUBCUTANEOUS
  Administered 2017-08-12: 3 [IU] via SUBCUTANEOUS
  Administered 2017-08-13 (×2): 5 [IU] via SUBCUTANEOUS
  Administered 2017-08-13: 3 [IU] via SUBCUTANEOUS
  Administered 2017-08-14: 8 [IU] via SUBCUTANEOUS
  Administered 2017-08-14 (×2): 5 [IU] via SUBCUTANEOUS
  Administered 2017-08-15: 8 [IU] via SUBCUTANEOUS
  Administered 2017-08-15: 5 [IU] via SUBCUTANEOUS
  Administered 2017-08-16: 11 [IU] via SUBCUTANEOUS
  Administered 2017-08-16 (×2): 8 [IU] via SUBCUTANEOUS
  Administered 2017-08-17: 5 [IU] via SUBCUTANEOUS
  Administered 2017-08-17 – 2017-08-18 (×4): 3 [IU] via SUBCUTANEOUS
  Administered 2017-08-18 – 2017-08-19 (×3): 5 [IU] via SUBCUTANEOUS
  Administered 2017-08-19: 8 [IU] via SUBCUTANEOUS
  Filled 2017-08-11 (×20): qty 1

## 2017-08-11 MED ORDER — INSULIN ASPART 100 UNIT/ML ~~LOC~~ SOLN
5.0000 [IU] | Freq: Three times a day (TID) | SUBCUTANEOUS | Status: DC
  Administered 2017-08-12: 5 [IU] via SUBCUTANEOUS
  Filled 2017-08-11: qty 1

## 2017-08-11 MED ORDER — INSULIN GLARGINE 100 UNIT/ML ~~LOC~~ SOLN
40.0000 [IU] | Freq: Two times a day (BID) | SUBCUTANEOUS | Status: DC
  Administered 2017-08-11 (×2): 40 [IU] via SUBCUTANEOUS
  Filled 2017-08-11 (×4): qty 0.4

## 2017-08-11 MED ORDER — CARVEDILOL 25 MG PO TABS
25.0000 mg | ORAL_TABLET | Freq: Two times a day (BID) | ORAL | Status: DC
  Administered 2017-08-11 – 2017-08-19 (×16): 25 mg via ORAL
  Filled 2017-08-11 (×16): qty 1

## 2017-08-11 MED ORDER — IOPAMIDOL (ISOVUE-300) INJECTION 61%
75.0000 mL | Freq: Once | INTRAVENOUS | Status: AC | PRN
  Administered 2017-08-11: 75 mL via INTRAVENOUS

## 2017-08-11 MED ORDER — ACETAMINOPHEN 650 MG RE SUPP: 650.0000 mg | Freq: Four times a day (QID) | RECTAL | Status: DC | PRN

## 2017-08-11 MED ORDER — ONDANSETRON HCL 4 MG PO TABS: 4.0000 mg | ORAL_TABLET | Freq: Four times a day (QID) | ORAL | Status: DC | PRN

## 2017-08-11 MED ORDER — PIPERACILLIN-TAZOBACTAM 3.375 G IVPB
3.3750 g | Freq: Three times a day (TID) | INTRAVENOUS | Status: DC
  Administered 2017-08-11 – 2017-08-14 (×10): 3.375 g via INTRAVENOUS
  Filled 2017-08-11 (×10): qty 50

## 2017-08-11 MED ORDER — SENNOSIDES-DOCUSATE SODIUM 8.6-50 MG PO TABS: 1.0000 | ORAL_TABLET | Freq: Every evening | ORAL | Status: DC | PRN

## 2017-08-11 MED ORDER — ACETAMINOPHEN 325 MG PO TABS: 650.0000 mg | ORAL_TABLET | Freq: Four times a day (QID) | ORAL | Status: DC | PRN

## 2017-08-11 MED ORDER — HYDROCODONE-ACETAMINOPHEN 5-325 MG PO TABS
1.0000 | ORAL_TABLET | ORAL | Status: DC | PRN
  Administered 2017-08-11 (×2): 1 via ORAL
  Filled 2017-08-11 (×2): qty 1

## 2017-08-11 NOTE — H&P (Addendum)
[facility] [place] NAME: [name]    MR#:  [id number]  DATE OF BIRTH:  Nov 19, 1955  DATE OF ADMISSION:  08/11/2017  PRIMARY CARE PHYSICIAN: [name], PA-C   REQUESTING/REFERRING PHYSICIAN: Dr. Bary Castilla  CHIEF COMPLAINT:  No chief complaint on file.  Left thigh abscess 2 months worsening recently HISTORY OF PRESENT ILLNESS:  [name]  is a 62 y.o. female with a known history of multiple medical problems as below.  The patient is sent for direct admission by Dr. Tollie Pizza due to above reason.  The patient denies any fever but has chills sometimes.  She said that she has had the abscess for 2-3 months, she was on doxycycline for 6 weeks and off antibiotics.  Her left thigh abscess has been worsening and bigger.  She denies any other symptoms.  Per Dr. Tollie Pizza, the patient blood sugar was not controlled and she has multiple medical problems, need to be admitted to the hospital under medical service.  He will order CT of left thigh and possible surgery.  PAST MEDICAL HISTORY:   Past Medical History:  Diagnosis Date  . Aortic stenosis    Echo 8/18: mean 13, peak 28, LVOT/AV mean velocity 0.51  . Asthma    As a child   . Bronchitis   . CAD (coronary artery disease)    a. 09/2016: 50% Ost 1st Mrg stenosis, 50% 2nd Mrg stenosis, 20% Mid-Cx, 95% Prox LAD, 40% mid-LAD, and 10% dist-LAD stenosis. Staged PCI with DES to Prox-LAD.   [name] Chronic combined systolic and diastolic CHF (congestive heart failure) ([place]) 2011   echo 2/18: EF 55-60, normal wall motion, grade 2 diastolic dysfunction, trivial AI // echo 3/18: Septal and apical HK, EF 45-50, normal wall motion, trivial AI, mild LAE, PASP 38 // echo 8/18: EF 60-65, normal wall motion, grade 1 diastolic dysfunction, calcified aortic valve leaflets, mild aortic stenosis (mean 13, peak 28, LVOT/AV mean velocity 0.51), mild AI, moderate MAC, mild LAE, trivial TR   . Complication of  anesthesia   . Diabetes mellitus Dx 1989  . Hepatitis C Dx 2013  . Hypertension Dx 1989  . Obesity   . Pancreatitis 2013  . Refusal of blood transfusions as patient is Jehovah's Witness   . Tendinitis   . Ulcer 2010    PAST SURGICAL HISTORY:   Past Surgical History:  Procedure Laterality Date  . CHOLECYSTECTOMY    . CORONARY STENT INTERVENTION N/A 09/18/2016   Procedure: Coronary Stent Intervention;  Surgeon: Troy Sine, MD;  Location: [facility];  Service: Cardiovascular;  Laterality: N/A;  . EYE SURGERY    . KNEE ARTHROSCOPY    . LEFT HEART CATH N/A 09/18/2016   Procedure: Left Heart Cath;  Surgeon: Troy Sine, MD;  Location: [facility];  Service: Cardiovascular;  Laterality: N/A;  . LEFT HEART CATH AND CORONARY ANGIOGRAPHY N/A 09/16/2016   Procedure: Left Heart Cath and Coronary Angiography;  Surgeon: Burnell Blanks, MD;  Location: [facility];  Service: Cardiovascular;  Laterality: N/A;  . LEFT HEART CATH AND CORONARY ANGIOGRAPHY N/A 04/29/2017   Procedure: LEFT HEART CATH AND CORONARY ANGIOGRAPHY;  Surgeon: Nelva Bush, MD;  Location: [facility];  Service: Cardiovascular;  Laterality: N/A;  . TUBAL LIGATION    . TUBAL LIGATION      SOCIAL HISTORY:   Social History   Tobacco Use  . Smoking status: Former  Smoker    Last attempt to quit: 10/25/1980    Years since quitting: 36.8  . Smokeless tobacco: Never Used  . Tobacco comment: quit smoking in 1982  Substance Use Topics  . Alcohol use: Yes    Comment: 3 times in last year    FAMILY HISTORY:   Family History  Problem Relation Age of Onset  . Colon cancer Mother   . Heart attack Other   . Heart attack Maternal Grandmother   . Hypertension Sister   . Hypertension Brother   . Diabetes Paternal Grandmother   . Breast cancer Neg Hx     DRUG ALLERGIES:   Allergies  Allergen Reactions  . Shellfish Allergy Anaphylaxis and Swelling  . Diazepam Other (See Comments)      "felt like out of body experience"  . Morphine And Related Itching    REVIEW OF SYSTEMS:   Review of Systems  Constitutional: Positive for chills. Negative for fever and malaise/fatigue.  HENT: Negative for sore throat.   Eyes: Negative for blurred vision and double vision.  Respiratory: Negative for cough, hemoptysis, shortness of breath, wheezing and stridor.   Cardiovascular: Negative for chest pain, palpitations, orthopnea and leg swelling.  Gastrointestinal: Negative for abdominal pain, blood in stool, diarrhea, melena, nausea and vomiting.  Genitourinary: Negative for dysuria, flank pain and hematuria.  Musculoskeletal: Negative for back pain and joint pain.       Left thigh abscess  Neurological: Negative for dizziness, sensory change, focal weakness, seizures, loss of consciousness, weakness and headaches.  Endo/Heme/Allergies: Negative for polydipsia.  Psychiatric/Behavioral: Negative for depression. The patient is not nervous/anxious.     MEDICATIONS AT HOME:   Prior to Admission medications   Medication Sig Start Date End Date Taking? Authorizing Provider  acetaminophen-codeine (TYLENOL #3) 300-30 MG tablet Take 1 tablet by mouth every 12 (twelve) hours as needed for severe pain. For osteoarthritis (unable to take NSAIDs due to CKD) 04/14/17   Charlott Rakes, MD  albuterol (PROVENTIL) (2.5 MG/3ML) 0.083% nebulizer solution Take 3 mLs (2.5 mg total) by nebulization every 6 (six) hours as needed for wheezing or shortness of breath. 12/17/16   Argentina Donovan, PA-C  albuterol (VENTOLIN HFA) 108 (90 Base) MCG/ACT inhaler Inhale 2 puffs into the lungs every 4 (four) hours as needed for wheezing or shortness of breath. 08/03/16   Boykin Nearing, MD  allopurinol (ZYLOPRIM) 100 MG tablet Take 1 tablet (100 mg total) by mouth daily. 02/12/17   Funches, Adriana Mccallum, MD  amLODipine (NORVASC) 10 MG tablet Take 1 tablet (10 mg total) by mouth daily. 04/14/17   Charlott Rakes, MD  aspirin  EC 81 MG EC tablet Take 1 tablet (81 mg total) by mouth daily. 09/19/16   Strader, Fransisco Hertz, PA-C  atorvastatin (LIPITOR) 80 MG tablet Take 1 tablet (80 mg total) by mouth daily at 6 PM. 02/12/17   Boykin Nearing, MD  Blood Glucose Monitoring Suppl (ACCU-CHEK AVIVA) device Use as instructed daily. 04/14/17   Charlott Rakes, MD  carvedilol (COREG) 25 MG tablet TAKE 1 TABLET BY MOUTH 2 TIMES DAILY WITH A MEAL. 02/12/17   Funches, Adriana Mccallum, MD  docusate sodium (COLACE) 100 MG capsule Take 1 capsule (100 mg total) every 12 (twelve) hours by mouth. 05/25/17   Jola Schmidt, MD  doxycycline (VIBRA-TABS) 100 MG tablet Take 1 tablet (100 mg total) by mouth 2 (two) times daily. Patient not taking: Reported on 08/05/2017 07/23/17   [name], PA-C  Exenatide ER (BYDUREON BCISE) 2  MG/0.85ML AUIJ Inject 2 mg into the skin once a week. Patient not taking: Reported on 08/11/2017 07/23/17   [name], PA-C  ferrous sulfate 325 (65 FE) MG tablet Take 1 tablet (325 mg total) by mouth 2 (two) times daily with a meal. 04/14/17   Charlott Rakes, MD  fluticasone (FLONASE) 50 MCG/ACT nasal spray Place 2 sprays into both nostrils daily as needed for allergies or rhinitis. 01/07/17   Funches, Adriana Mccallum, MD  Fluticasone-Salmeterol (ADVAIR DISKUS) 250-50 MCG/DOSE AEPB Inhale 1 puff into the lungs 2 (two) times daily. Patient taking differently: Inhale 1 puff into the lungs 2 (two) times daily as needed (shortness of breath).  12/17/16   Argentina Donovan, PA-C  furosemide (LASIX) 40 MG tablet Take 1 tablet (40 mg total) by mouth daily. 05/12/17 05/12/18  Lyda Jester M, PA-C  gabapentin (NEURONTIN) 100 MG capsule TAKE 2 CAPSULES BY MOUTH 3 TIMES A DAY. Patient taking differently: Take 200 mg by mouth 3 (three) times daily.  05/10/17   Charlott Rakes, MD  glucose blood (ACCU-CHEK AVIVA) test strip Use as instructed daily 04/14/17   Charlott Rakes, MD  glucose blood (TRUE METRIX BLOOD GLUCOSE TEST) test strip  Use as instructed 03/19/17   Barrett, Evelene Croon, PA-C  hydrALAZINE (APRESOLINE) 50 MG tablet Take 1 tablet (50 mg total) by mouth 3 (three) times daily. 02/12/17   Funches, Adriana Mccallum, MD  HYDROcodone-acetaminophen (NORCO/VICODIN) 5-325 MG tablet Take 1 tablet every 4 (four) hours as needed by mouth for moderate pain. 05/24/17   Jola Schmidt, MD  hydrOXYzine (ATARAX/VISTARIL) 50 MG tablet Take 1 tablet (50 mg total) by mouth 3 (three) times daily as needed for itching. 08/04/17   Charlott Rakes, MD  insulin aspart (NOVOLOG) 100 UNIT/ML FlexPen 1-12 units with meals as needed per sliding scale instructions 07/23/17   [name], PA-C  Insulin Glargine (LANTUS SOLOSTAR) 100 UNIT/ML Solostar Pen Inject 40 Units into the skin 2 (two) times daily. 07/23/17   [name], PA-C  isosorbide mononitrate (IMDUR) 60 MG 24 hr tablet Take 1 tablet (60 mg total) by mouth daily. 05/05/17   Lyda Jester M, PA-C  ketotifen (ZADITOR) 0.025 % ophthalmic solution Place 1 drop into both eyes daily as needed (for dry eyes). 07/27/16   Boykin Nearing, MD  Lancet Devices [facility]) lancets Use as instructed daily. 04/14/17   Charlott Rakes, MD  levofloxacin (LEVAQUIN) 750 MG tablet Take 1 tablet (750 mg total) by mouth every other day. Patient not taking: Reported on 08/05/2017 06/08/17   Charlott Rakes, MD  MITIGARE 0.6 MG CAPS Take 1 capsule by mouth daily. 06/30/17   Charlott Rakes, MD  nitroGLYCERIN (NITROSTAT) 0.4 MG SL tablet Place 1 tablet (0.4 mg total) under the tongue every 5 (five) minutes x 3 doses as needed for chest pain. 03/19/17   Barrett, Evelene Croon, PA-C  omeprazole (PRILOSEC) 20 MG capsule Take 1 capsule (20 mg total) by mouth daily. 04/14/17   Charlott Rakes, MD  oxyCODONE-acetaminophen (PERCOCET/ROXICET) 5-325 MG tablet Take 1 tablet by mouth every 6 (six) hours as needed for severe pain. 06/18/17   Charlann Lange, PA-C  PEDIALYTE (PEDIALYTE) SOLN Take 240 mLs by mouth every 6  (six) hours.     [provider]  pregabalin (LYRICA) 100 MG capsule Take 1 capsule (100 mg total) by mouth 3 (three) times daily. 05/03/17   Charlott Rakes, MD  ticagrelor (BRILINTA) 90 MG TABS tablet Take 1 tablet (90 mg total) by mouth 2 (two)  times daily. 05/05/17   Lyda Jester M, PA-C  valACYclovir (VALTREX) 500 MG tablet Take 1 tablet (500 mg total) by mouth daily. 04/14/17   Charlott Rakes, MD      VITAL SIGNS:  Blood pressure (!) 161/71, pulse 85, temperature 98 F (36.7 C), temperature source Oral, resp. rate 17, SpO2 99 %.  PHYSICAL EXAMINATION:  Physical Exam  Constitutional: She is oriented to person, place, and time and well-developed, well-nourished, and in no distress.  Morbid obesity.  HENT:  Head: Normocephalic.  Mouth/Throat: Oropharynx is clear and moist.  Eyes: Conjunctivae and EOM are normal. Pupils are equal, round, and reactive to light. No scleral icterus.  Neck: Normal range of motion. Neck supple. No JVD present. No tracheal deviation present.  Cardiovascular: Normal rate, regular rhythm and normal heart sounds. Exam reveals no gallop.  No murmur heard. Pulmonary/Chest: Effort normal and breath sounds normal. No respiratory distress. She has no wheezes. She has no rales.  Abdominal: Soft. Bowel sounds are normal. She exhibits no distension. There is no tenderness. There is no rebound.  Musculoskeletal: Normal range of motion. She exhibits no edema or tenderness.  Abscess about 10 cm in front of left thigh with erythema and tenderness.  Neurological: She is alert and oriented to person, place, and time. No cranial nerve deficit.  Skin: No rash noted. No erythema.  Psychiatric: Affect normal.    LABORATORY PANEL:   CBC No results for input(s): WBC, HGB, HCT, PLT in the last 168 hours. ------------------------------------------------------------------------------------------------------------------  Chemistries  Recent Labs  Lab  08/05/17 1557  NA 135  K 4.4  CL 96  CO2 22  GLUCOSE 607*  BUN 47*  CREATININE 1.81*  CALCIUM 9.1   ------------------------------------------------------------------------------------------------------------------  Cardiac Enzymes No results for input(s): TROPONINI in the last 168 hours. ------------------------------------------------------------------------------------------------------------------  RADIOLOGY:  No results found.    IMPRESSION AND PLAN:   Left thigh abscess The patient is admitted directly to the hospital. Start vancomycin and Zosyn pharmacy to dose. Dr. Bary Castilla will order CT of the left thigh. Follow-up with Dr. Bary Castilla for surgery.  History of chronic diastolic CHF. Stable, continue Lasix.  Hypertension.  Continue home hypertension medication.  Diabetes.  Continue Lantus 40 units twice daily and start sliding scale.  CKD stage III.  Follow-up BMP.  Morbid obesity and OSA.  CPAP at night.   All the records are reviewed and case discussed with ED provider. Management plans discussed with the patient, her daughter and they are in agreement.  CODE STATUS: Full code  TOTAL TIME TAKING CARE OF THIS PATIENT: 55 minutes.    Demetrios Loll M.D on 08/11/2017 at 1:44 PM  Between 7am to 6pm - Pager - [phone]  After 6pm go to [email or web address] - password [email or web address]  [facility]  Office  [phone]  CC: Primary care physician; [name], PA-C   Note: This dictation was prepared with Dragon dictation along with smaller phrase technology. Any transcriptional errors that result from this process are unin

## 2017-08-11 NOTE — Consult Note (Signed)
Reason for Consult: Left thigh abscess, recurrent Referring Physician: Joelene Millin, PA  [name] is an 62 y.o. female.  HPI: This 62 year old woman was seen at [facility] hospital in [place] on multiple occasions in November 2018.  On one occasion she underwent incision and drainage of an abscess which on CT was reported being up to 8 cm in diameter.  She had persistent pain in by report had enough bleeding to require transfusion after incision and drainage.  The patient was discharged home on oral antibiotics.  She has noticed increased swelling in the area over the last several weeks.  When seen by cardiology last week her blood sugar was over 600.  Today her blood sugar is 381.  There is a 8+ centimeters size mass with ulceration of the skin (likely site of previous incision and drainage) on the mid medial side of the left thigh.  Erythema noted below extending about 3 cm.  With her poor glucose control and likely recurrent abscess, admission to the hospital for blood sugar control, CT scan to assess the size of the abscess and to potentially delineate the source of the marked bleeding reported with the last incision and drainage in preparation for operative intervention.  The patient is on antiplatelet therapy for a stent placed in March 2018.  Review of the [place] record documented a transfusion, although noted below in her past medical history is refusal of blood transfusions.  Past Medical History:  Diagnosis Date  . Aortic stenosis    Echo 8/18: mean 13, peak 28, LVOT/AV mean velocity 0.51  . Asthma    As a child   . Bronchitis   . CAD (coronary artery disease)    a. 09/2016: 50% Ost 1st Mrg stenosis, 50% 2nd Mrg stenosis, 20% Mid-Cx, 95% Prox LAD, 40% mid-LAD, and 10% dist-LAD stenosis. Staged PCI with DES to Prox-LAD.   [name] Chronic combined systolic and diastolic CHF (congestive heart failure) ([place]) 2011   echo 2/18: EF 55-60, normal wall motion, grade 2 diastolic  dysfunction, trivial AI // echo 3/18: Septal and apical HK, EF 45-50, normal wall motion, trivial AI, mild LAE, PASP 38 // echo 8/18: EF 60-65, normal wall motion, grade 1 diastolic dysfunction, calcified aortic valve leaflets, mild aortic stenosis (mean 13, peak 28, LVOT/AV mean velocity 0.51), mild AI, moderate MAC, mild LAE, trivial TR   . Complication of anesthesia   . Diabetes mellitus Dx 1989  . Hepatitis C Dx 2013  . Hypertension Dx 1989  . Obesity   . Pancreatitis 2013  . Refusal of blood transfusions as patient is Jehovah's Witness   . Tendinitis   . Ulcer 2010    Past Surgical History:  Procedure Laterality Date  . CHOLECYSTECTOMY    . CORONARY STENT INTERVENTION N/A 09/18/2016   Procedure: Coronary Stent Intervention;  Surgeon: Troy Sine, MD;  Location: [facility];  Service: Cardiovascular;  Laterality: N/A;  . EYE SURGERY    . KNEE ARTHROSCOPY    . LEFT HEART CATH N/A 09/18/2016   Procedure: Left Heart Cath;  Surgeon: Troy Sine, MD;  Location: [facility];  Service: Cardiovascular;  Laterality: N/A;  . LEFT HEART CATH AND CORONARY ANGIOGRAPHY N/A 09/16/2016   Procedure: Left Heart Cath and Coronary Angiography;  Surgeon: Burnell Blanks, MD;  Location: [facility];  Service: Cardiovascular;  Laterality: N/A;  . LEFT HEART CATH AND CORONARY ANGIOGRAPHY N/A 04/29/2017   Procedure: LEFT HEART CATH  AND CORONARY ANGIOGRAPHY;  Surgeon: Nelva Bush, MD;  Location: [facility];  Service: Cardiovascular;  Laterality: N/A;  . TUBAL LIGATION    . TUBAL LIGATION      Family History  Problem Relation Age of Onset  . Colon cancer Mother   . Heart attack Other   . Heart attack Maternal Grandmother   . Hypertension Sister   . Hypertension Brother   . Diabetes Paternal Grandmother   . Breast cancer Neg Hx     Social History:  reports that she quit smoking about 36 years ago. she has never used smokeless tobacco. She reports that she  drinks alcohol. She reports that she does not use drugs.  Allergies:  Allergies  Allergen Reactions  . Shellfish Allergy Anaphylaxis and Swelling  . Diazepam Other (See Comments)    "felt like out of body experience"  . Morphine And Related Itching       No results found for this or any previous visit (from the past 48 hour(s)).  No results found.  ROS Blood pressure (!) 161/71, pulse 85, temperature 98 F (36.7 C), temperature source Oral, resp. rate 17, SpO2 99 %. Physical Exam  Musculoskeletal:       Legs:   Assessment/Plan: Recurrent left medial thigh abscess. Culture from June 02, 2017 showed strep viridans.  Gram-negative rods identified, but no gram-negative organism cultured.  The strep viridans was sensitive to ceftriaxone, levofloxacin and vancomycin.  Plan: CT of the left femur with IV contrast (estimated GFR 35 on August 05, 2017.  Glycemic control per medicine.  Plans for surgical debridement based on CT scan.  Forest Gleason Vinal Rosengrant 08/11/2017, 1:18 PM

## 2017-08-11 NOTE — Progress Notes (Signed)
MD made aware that pt.'s CBG at 1517 was 446, new orders to give one time dose of 20 units of Novolog with her scheduled 40 units of Lantus at this time. Will continue to monitor .   Muhammadali Ries [employer]

## 2017-08-11 NOTE — Progress Notes (Signed)
CT of the left femur reviewed.  5 x 6 x 11 cm area of soft tissue swelling with a central component up to 6 cm in diameter suggestive of a persistent abscess/hematoma.  The saphenous vein appears to run anterior lateral to the inflammatory mass.  We will schedule the patient for operative debridement and wound VAC application tomorrow.

## 2017-08-11 NOTE — Progress Notes (Signed)
Patient: [name] Female    DOB: 07-26-1955   62 y.o.   MRN: [id number] Visit Date: 08/11/2017  Today's Provider: Trinna Post, PA-C   Chief Complaint  Patient presents with  . Abscess   Subjective:    HPI   [name] is a 62 y/o with CHF, CKD Stage IV, Hepatitis C, severely uncontrolled Type II DM who presents today for worsening abscess.   She was admitted to [facility] on 06/01/2017 for left medial thigh abscess in the setting of uncontrolled diabetes mellitus. She had previously had this abscess incised and drained on 05/24/2017 and was discharged home on antibiotics. She represented to [facility] ED where she underwent I&D in the emergency room and had severe bleeding due to ASA and Brilinta therapy. Surgery was consulted and advised compression which did halt the bleeding. At that time, she received 1 unit PRBC. CT scan on 06/01/2017 of left thigh did not show necrotizing fasciitis, did show cellulitis. A subsequent CT scan on 06/05/2017 after I&D showed subcutaneous fluid collection with air locule and persistent stranding indicating cellulitis. She was start empirically on IV vancomycin and ceftriaxone while cultures were pending. She was then started on Bactrim which was subsequently changed to Levaquin. She was supposed to follow up with a surgeon after discharge, though no office visit is present in EMR. Her total hospital stay was 4 days.  Today she presents to the clinic with worsening abscess for one week. She endorses increased redness, swelling and pain at the site of prior abscess. She endorses drainage from the site. She says she has felt a bit chilly. She also endorses nausea without vomiting. She says her sugar this morning was 265. Her sugar in the clinic today was 338.    Allergies  Allergen Reactions  . Shellfish Allergy Anaphylaxis and Swelling  . Diazepam Other (See Comments)    "felt like out of body experience"  . Morphine And Related  Itching    No current outpatient medications on file.  Review of Systems  Constitutional: Positive for chills, diaphoresis, fatigue and fever. Negative for activity change, appetite change and unexpected weight change.  Skin: Positive for wound. Negative for color change, pallor and rash.   Family History  Problem Relation Age of Onset  . Colon cancer Mother   . Heart attack Other   . Heart attack Maternal Grandmother   . Hypertension Sister   . Hypertension Brother   . Diabetes Paternal Grandmother   . Breast cancer Neg Hx    Past Surgical History:  Procedure Laterality Date  . CHOLECYSTECTOMY    . CORONARY STENT INTERVENTION N/A 09/18/2016   Procedure: Coronary Stent Intervention;  Surgeon: Troy Sine, MD;  Location: [place] [facility];  Service: Cardiovascular;  Laterality: N/A;  . EYE SURGERY    . KNEE ARTHROSCOPY    . LEFT HEART CATH N/A 09/18/2016   Procedure: Left Heart Cath;  Surgeon: Troy Sine, MD;  Location: [place] [facility];  Service: Cardiovascular;  Laterality: N/A;  . LEFT HEART CATH AND CORONARY ANGIOGRAPHY N/A 09/16/2016   Procedure: Left Heart Cath and Coronary Angiography;  Surgeon: Burnell Blanks, MD;  Location: [place] [facility];  Service: Cardiovascular;  Laterality: N/A;  . LEFT HEART CATH AND CORONARY ANGIOGRAPHY N/A 04/29/2017   Procedure: LEFT HEART CATH AND CORONARY ANGIOGRAPHY;  Surgeon: Nelva Bush, MD;  Location: [place] [facility];  Service: Cardiovascular;  Laterality:  N/A;  . TUBAL LIGATION    . TUBAL LIGATION       Social History   Tobacco Use  . Smoking status: Former Smoker    Last attempt to quit: 10/25/1980    Years since quitting: 36.8  . Smokeless tobacco: Never Used  . Tobacco comment: quit smoking in 1982  Substance Use Topics  . Alcohol use: Yes    Comment: 3 times in last year   Objective:   BP 128/70 (BP Location: Left Arm, Patient Position: Sitting, Cuff Size: Large)   Pulse 84   Temp 98.2 F (36.8 C)  (Oral)   Resp 16   Wt (!) 301 lb (136.5 kg)   BMI 45.77 kg/m  Vitals:   08/11/17 1105  BP: 128/70  Pulse: 84  Resp: 16  Temp: 98.2 F (36.8 C)  TempSrc: Oral  Weight: (!) 301 lb (136.5 kg)     Physical Exam  Constitutional: She is oriented to person, place, and time. She appears well-developed and well-nourished.  Appears uncomfortable and to be in pain.  Cardiovascular: Normal rate.  Pulmonary/Chest: Effort normal and breath sounds normal.  Musculoskeletal: She exhibits edema and tenderness.  Neurological: She is alert and oriented to person, place, and time.  Skin: Skin is warm and dry. There is erythema.  There is an abscess present on her left medial thigh. There are several openings expressing purulent drainage. There is a 2 cm portion of induration just inferior to area draining. There is also some fluctuance surrounding this area. Please see included photo. First photo is from patient's phone of her initial abscess in November. Second picture is of abscess on 1/302019.  Psychiatric: She has a normal mood and affect. Her behavior is normal.   Media Information   Document Information   Photos  Initial abscess 05/2017  08/11/2017 11:33  Attached To:  Office Visit on 08/11/17 with Trinna Post, PA-C  Source Information   Paulene Floor  [facility]   Media Information   Document Information   Photos  Left medial thigh abscess  08/11/2017 11:33  Attached To:  Office Visit on 08/11/17 with Trinna Post, PA-C  Source Information   Paulene Floor  [facility]        Assessment & Plan:     1. Abscess of left thigh  Patient presents with worsening left medial thigh abscess in the setting of uncontrolled diabetes, sugar today 338. I have called general surgeon Dr. Bary Castilla who very kindly consulted on this patient in our clinic. After examinations, he is going to have this patient direct admitted at [facility] for  further workup with noncontrast CT and medical management of her comorbidities.  2. CKD (chronic kidney disease) stage 4, GFR 15-29 ml/min (HCC)   3. Diabetes mellitus type 2, uncontrolled, with complications ([place])   4. Chronic combined systolic and diastolic CHF (congestive heart failure) ([place])   5. Hep C w/o coma, chronic (HCC)  No Follow-up on file.  The entirety of the information documented in the History of Present Illness, Review of Systems and Physical Exam were personally obtained by me. Portions of this information were initially documented by Ashley Royalty, CMA and reviewed by me for thoroughness and accuracy.          Trinna Post, PA-C  [facility]

## 2017-08-11 NOTE — Progress Notes (Addendum)
Pharmacy Antibiotic Note  [name] is a 62 y.o. female admitted on 08/11/2017 with wound infection.  Pharmacy has been consulted for zosyn and vancomycin dosing.  Plan: Vancomycin 2000mg  IV every 24 hours.  Goal trough 15-20 mcg/mL. Zosyn 3.375g IV q8h (4 hour infusion). Vancomycin trough 2/2@1530     Temp (24hrs), Avg:98.1 F (36.7 C), Min:98 F (36.7 C), Max:98.2 F (36.8 C)  Recent Labs  Lab 08/05/17 1557 08/11/17 1435  WBC  --  5.7  CREATININE 1.81* 1.95*    Estimated Creatinine Clearance: 45.6 mL/min (A) (by C-G formula based on SCr of 1.95 mg/dL (H)).    Allergies  Allergen Reactions  . Shellfish Allergy Anaphylaxis and Swelling  . Diazepam Other (See Comments)    "felt like out of body experience"  . Morphine And Related Itching    Antimicrobials this admission: Anti-infectives (From admission, onward)   Start     Dose/Rate Route Frequency Ordered Stop   08/11/17 1615  piperacillin-tazobactam (ZOSYN) IVPB 3.375 g     3.375 g 12.5 mL/hr over 240 Minutes Intravenous Every 8 hours 08/11/17 1602     08/11/17 1600  vancomycin (VANCOCIN) 2,000 mg in sodium chloride 0.9 % 500 mL IVPB     2,000 mg 250 mL/hr over 120 Minutes Intravenous Every 24 hours 08/11/17 1558         Microbiology results: No results found for this or any previous visit (from the past 240 hour(s)).  Thank you for allowing pharmacy to be a part of this patient's care.  Donna Christen Reign Dziuba 08/11/2017 4:02 PM

## 2017-08-11 NOTE — Patient Instructions (Signed)
Skin Abscess A skin abscess is an infected area on or under your skin that contains pus and other material. An abscess can happen almost anywhere on your body. Some abscesses break open (rupture) on their own. Most continue to get worse unless they are treated. The infection can spread deeper into the body and into your blood, which can make you feel sick. Treatment usually involves draining the abscess. Follow these instructions at home: Abscess Care  If you have an abscess that has not drained, place a warm, clean, wet washcloth over the abscess several times a day. Do this as told by your doctor.  Follow instructions from your doctor about how to take care of your abscess. Make sure you: ? Cover the abscess with a bandage (dressing). ? Change your bandage or gauze as told by your doctor. ? Wash your hands with soap and water before you change the bandage or gauze. If you cannot use soap and water, use hand sanitizer.  Check your abscess every day for signs that the infection is getting worse. Check for: ? More redness, swelling, or pain. ? More fluid or blood. ? Warmth. ? More pus or a bad smell. Medicines   Take over-the-counter and prescription medicines only as told by your doctor.  If you were prescribed an antibiotic medicine, take it as told by your doctor. Do not stop taking the antibiotic even if you start to feel better. General instructions  To avoid spreading the infection: ? Do not share personal care items, towels, or hot tubs with others. ? Avoid making skin-to-skin contact with other people.  Keep all follow-up visits as told by your doctor. This is important. Contact a doctor if:  You have more redness, swelling, or pain around your abscess.  You have more fluid or blood coming from your abscess.  Your abscess feels warm when you touch it.  You have more pus or a bad smell coming from your abscess.  You have a fever.  Your muscles ache.  You have  chills.  You feel sick. Get help right away if:  You have very bad (severe) pain.  You see red streaks on your skin spreading away from the abscess. This information is not intended to replace advice given to you by your health care provider. Make sure you discuss any questions you have with your health care provider. Document Released: 12/16/2007 Document Revised: 02/23/2016 Document Reviewed: 05/08/2015 Elsevier Interactive Patient Education  2018 [employer]  

## 2017-08-11 NOTE — Telephone Encounter (Signed)
-----   Message from Robert Bellow, MD sent at 08/11/2017 12:13 PM EST ----- Call admitting, please,. Direct admit to Dr.Chen/ Hospitalist service.  Left thigh abscess, poorly controlled diabetes.

## 2017-08-11 NOTE — Telephone Encounter (Signed)
Room #228 per [facility] in Admissions.   Patient aware.

## 2017-08-12 ENCOUNTER — Encounter: Payer: Self-pay | Admitting: Certified Registered Nurse Anesthetist

## 2017-08-12 ENCOUNTER — Inpatient Hospital Stay: Payer: Medicaid Other | Admitting: Certified Registered Nurse Anesthetist

## 2017-08-12 ENCOUNTER — Encounter
Admission: AD | Disposition: A | Discharge status: Home Health | Payer: Self-pay | Source: Ambulatory Visit | Attending: Internal Medicine

## 2017-08-12 HISTORY — PX: INCISION AND DRAINAGE ABSCESS: SHX5864

## 2017-08-12 LAB — CBC
HCT: 30.6 % — ABNORMAL LOW (ref 35.0–47.0)
Hemoglobin: 10.2 g/dL — ABNORMAL LOW (ref 12.0–16.0)
MCH: 28.3 pg (ref 26.0–34.0)
MCHC: 33.4 g/dL (ref 32.0–36.0)
MCV: 84.6 fL (ref 80.0–100.0)
Platelets: 224 10*3/uL (ref 150–440)
RBC: 3.61 MIL/uL — ABNORMAL LOW (ref 3.80–5.20)
RDW: 15.9 % — ABNORMAL HIGH (ref 11.5–14.5)
WBC: 5.6 10*3/uL (ref 3.6–11.0)

## 2017-08-12 LAB — BASIC METABOLIC PANEL
Anion gap: 10 (ref 5–15)
BUN: 56 mg/dL — ABNORMAL HIGH (ref 6–20)
CO2: 21 mmol/L — ABNORMAL LOW (ref 22–32)
Calcium: 8 mg/dL — ABNORMAL LOW (ref 8.9–10.3)
Chloride: 103 mmol/L (ref 101–111)
Creatinine, Ser: 2.27 mg/dL — ABNORMAL HIGH (ref 0.44–1.00)
GFR calc Af Amer: 26 mL/min — ABNORMAL LOW (ref >60)
GFR calc non Af Amer: 22 mL/min — ABNORMAL LOW (ref >60)
Glucose, Bld: 347 mg/dL — ABNORMAL HIGH (ref 65–99)
Potassium: 3.8 mmol/L (ref 3.5–5.1)
Sodium: 134 mmol/L — ABNORMAL LOW (ref 135–145)

## 2017-08-12 LAB — GLUCOSE, CAPILLARY
Glucose-Capillary: 294 mg/dL — ABNORMAL HIGH (ref 65–99)
Glucose-Capillary: 310 mg/dL — ABNORMAL HIGH (ref 65–99)
Glucose-Capillary: 237 mg/dL — ABNORMAL HIGH (ref 65–99)
Glucose-Capillary: 184 mg/dL — ABNORMAL HIGH (ref 65–99)
Glucose-Capillary: 198 mg/dL — ABNORMAL HIGH (ref 65–99)

## 2017-08-12 LAB — SURGICAL PCR SCREEN
MRSA, PCR: NEGATIVE
Staphylococcus aureus: NEGATIVE

## 2017-08-12 SURGERY — INCISION AND DRAINAGE, ABSCESS
Anesthesia: General | Site: Thigh | Laterality: Left | Wound class: Dirty or Infected

## 2017-08-12 MED ORDER — INSULIN ASPART 100 UNIT/ML ~~LOC~~ SOLN
10.0000 [IU] | Freq: Three times a day (TID) | SUBCUTANEOUS | Status: DC
  Administered 2017-08-12 – 2017-08-15 (×7): 10 [IU] via SUBCUTANEOUS
  Filled 2017-08-12 (×7): qty 1

## 2017-08-12 MED ORDER — ENOXAPARIN SODIUM 40 MG/0.4ML ~~LOC~~ SOLN
40.0000 mg | Freq: Two times a day (BID) | SUBCUTANEOUS | Status: DC
  Administered 2017-08-13 – 2017-08-15 (×5): 40 mg via SUBCUTANEOUS
  Filled 2017-08-12 (×6): qty 0.4

## 2017-08-12 MED ORDER — SUCCINYLCHOLINE CHLORIDE 20 MG/ML IJ SOLN
INTRAMUSCULAR | Status: AC
  Filled 2017-08-12: qty 1

## 2017-08-12 MED ORDER — FENTANYL CITRATE (PF) 100 MCG/2ML IJ SOLN
INTRAMUSCULAR | Status: AC
  Administered 2017-08-12: 25 ug via INTRAVENOUS
  Filled 2017-08-12: qty 2

## 2017-08-12 MED ORDER — EPHEDRINE SULFATE 50 MG/ML IJ SOLN
INTRAMUSCULAR | Status: DC | PRN
  Administered 2017-08-12 (×3): 10 mg via INTRAVENOUS

## 2017-08-12 MED ORDER — SODIUM CHLORIDE 0.9 % IV SOLN
INTRAVENOUS | Status: DC
Start: 2017-08-12 — End: 2017-08-15
  Administered 2017-08-12 – 2017-08-14 (×4): via INTRAVENOUS

## 2017-08-12 MED ORDER — INSULIN GLARGINE 100 UNIT/ML ~~LOC~~ SOLN
44.0000 [IU] | Freq: Two times a day (BID) | SUBCUTANEOUS | Status: DC
  Administered 2017-08-12 – 2017-08-13 (×3): 44 [IU] via SUBCUTANEOUS
  Filled 2017-08-12 (×6): qty 0.44

## 2017-08-12 MED ORDER — HEPARIN SODIUM (PORCINE) 5000 UNIT/ML IJ SOLN: 5000.0000 [IU] | Freq: Three times a day (TID) | INTRAMUSCULAR | Status: DC

## 2017-08-12 MED ORDER — FENTANYL CITRATE (PF) 100 MCG/2ML IJ SOLN
INTRAMUSCULAR | Status: AC
  Filled 2017-08-12: qty 2

## 2017-08-12 MED ORDER — MIDAZOLAM HCL 2 MG/2ML IJ SOLN
INTRAMUSCULAR | Status: DC | PRN
  Administered 2017-08-12: 2 mg via INTRAVENOUS

## 2017-08-12 MED ORDER — PHENYLEPHRINE HCL 10 MG/ML IJ SOLN
INTRAMUSCULAR | Status: DC | PRN
  Administered 2017-08-12 (×2): 100 ug via INTRAVENOUS

## 2017-08-12 MED ORDER — ONDANSETRON HCL 4 MG/2ML IJ SOLN
INTRAMUSCULAR | Status: DC | PRN
  Administered 2017-08-12: 4 mg via INTRAVENOUS

## 2017-08-12 MED ORDER — SUCCINYLCHOLINE CHLORIDE 20 MG/ML IJ SOLN
INTRAMUSCULAR | Status: DC | PRN
  Administered 2017-08-12: 100 mg via INTRAVENOUS

## 2017-08-12 MED ORDER — PROPOFOL 10 MG/ML IV BOLUS
INTRAVENOUS | Status: DC | PRN
  Administered 2017-08-12: 150 mg via INTRAVENOUS

## 2017-08-12 MED ORDER — LIDOCAINE HCL (PF) 2 % IJ SOLN
INTRAMUSCULAR | Status: AC
  Filled 2017-08-12: qty 10

## 2017-08-12 MED ORDER — INSULIN ASPART 100 UNIT/ML ~~LOC~~ SOLN
SUBCUTANEOUS | Status: AC
  Administered 2017-08-12: 8 [IU] via SUBCUTANEOUS
  Filled 2017-08-12: qty 1

## 2017-08-12 MED ORDER — VANCOMYCIN HCL 10 G IV SOLR
1500.0000 mg | INTRAVENOUS | Status: DC
  Administered 2017-08-12 – 2017-08-14 (×3): 1500 mg via INTRAVENOUS
  Filled 2017-08-12 (×3): qty 1500

## 2017-08-12 MED ORDER — ONDANSETRON HCL 4 MG/2ML IJ SOLN: 4.0000 mg | Freq: Once | INTRAMUSCULAR | Status: DC | PRN

## 2017-08-12 MED ORDER — DEXAMETHASONE SODIUM PHOSPHATE 10 MG/ML IJ SOLN
INTRAMUSCULAR | Status: AC
  Filled 2017-08-12: qty 1

## 2017-08-12 MED ORDER — INSULIN ASPART 100 UNIT/ML ~~LOC~~ SOLN
8.0000 [IU] | Freq: Once | SUBCUTANEOUS | Status: AC
  Administered 2017-08-12: 8 [IU] via SUBCUTANEOUS

## 2017-08-12 MED ORDER — FENTANYL CITRATE (PF) 100 MCG/2ML IJ SOLN
25.0000 ug | INTRAMUSCULAR | Status: DC | PRN
  Administered 2017-08-12 (×4): 25 ug via INTRAVENOUS

## 2017-08-12 MED ORDER — MORPHINE SULFATE (PF) 2 MG/ML IV SOLN
2.0000 mg | INTRAVENOUS | Status: DC | PRN
  Administered 2017-08-12 – 2017-08-18 (×13): 2 mg via INTRAVENOUS
  Filled 2017-08-12 (×15): qty 1

## 2017-08-12 MED ORDER — FENTANYL CITRATE (PF) 100 MCG/2ML IJ SOLN
INTRAMUSCULAR | Status: DC | PRN
  Administered 2017-08-12: 100 ug via INTRAVENOUS

## 2017-08-12 MED ORDER — OXYCODONE HCL 5 MG PO TABS
5.0000 mg | ORAL_TABLET | ORAL | Status: DC | PRN
  Administered 2017-08-12 – 2017-08-19 (×13): 5 mg via ORAL
  Filled 2017-08-12 (×14): qty 1

## 2017-08-12 MED ORDER — PROPOFOL 10 MG/ML IV BOLUS
INTRAVENOUS | Status: AC
  Filled 2017-08-12: qty 20

## 2017-08-12 MED ORDER — LIDOCAINE HCL (CARDIAC) 20 MG/ML IV SOLN
INTRAVENOUS | Status: DC | PRN
  Administered 2017-08-12: 100 mg via INTRAVENOUS

## 2017-08-12 MED ORDER — ONDANSETRON HCL 4 MG/2ML IJ SOLN
INTRAMUSCULAR | Status: AC
  Filled 2017-08-12: qty 2

## 2017-08-12 MED ORDER — SODIUM CHLORIDE 0.9 % IV SOLN: INTRAVENOUS | Status: DC

## 2017-08-12 MED ORDER — MIDAZOLAM HCL 2 MG/2ML IJ SOLN
INTRAMUSCULAR | Status: AC
  Filled 2017-08-12: qty 2

## 2017-08-12 MED ORDER — PIPERACILLIN-TAZOBACTAM 3.375 G IVPB
INTRAVENOUS | Status: AC
  Filled 2017-08-12: qty 50

## 2017-08-12 SURGICAL SUPPLY — 27 items
CANISTER SUCT 1200ML W/VALVE (MISCELLANEOUS) ×2 IMPLANT
CHLORAPREP W/TINT 26ML (MISCELLANEOUS) ×2 IMPLANT
DRAPE LAPAROTOMY 100X77 ABD (DRAPES) ×2 IMPLANT
DRSG TELFA 4X3 1S NADH ST (GAUZE/BANDAGES/DRESSINGS) ×2 IMPLANT
DRSG VAC ATS MED SENSATRAC (GAUZE/BANDAGES/DRESSINGS) ×1 IMPLANT
ELECT REM PT RETURN 9FT ADLT (ELECTROSURGICAL) ×2
ELECTRODE REM PT RTRN 9FT ADLT (ELECTROSURGICAL) ×1 IMPLANT
GLOVE BIO SURGEON STRL SZ7.5 (GLOVE) ×2 IMPLANT
GLOVE INDICATOR 8.0 STRL GRN (GLOVE) ×2 IMPLANT
GOWN STRL REUS W/ TWL LRG LVL3 (GOWN DISPOSABLE) ×2 IMPLANT
GOWN STRL REUS W/TWL LRG LVL3 (GOWN DISPOSABLE) ×4
KIT RM TURNOVER STRD PROC AR (KITS) ×2 IMPLANT
LABEL OR SOLS (LABEL) ×2 IMPLANT
NDL HYPO 25X1 1.5 SAFETY (NEEDLE) ×1 IMPLANT
NEEDLE HYPO 22GX1.5 SAFETY (NEEDLE) ×2 IMPLANT
NEEDLE HYPO 25X1 1.5 SAFETY (NEEDLE) ×2 IMPLANT
NS IRRIG 1000ML POUR BTL (IV SOLUTION) ×1 IMPLANT
PACK BASIN MINOR ARMC (MISCELLANEOUS) ×2 IMPLANT
SPONGE LAP 18X18 5 PK (GAUZE/BANDAGES/DRESSINGS) ×2 IMPLANT
STRIP CLOSURE SKIN 1/2X4 (GAUZE/BANDAGES/DRESSINGS) ×2 IMPLANT
SUT VIC AB 3-0 SH 27 (SUTURE) ×4
SUT VIC AB 3-0 SH 27X BRD (SUTURE) ×2 IMPLANT
SWAB DUAL CULTURE TRANS RED ST (MISCELLANEOUS) ×1 IMPLANT
SWABSTK COMLB BENZOIN TINCTURE (MISCELLANEOUS) ×2 IMPLANT
SYR BULB IRRIG 60ML STRL (SYRINGE) ×1 IMPLANT
SYR CONTROL 10ML (SYRINGE) ×2 IMPLANT
WND VAC CANISTER 500ML (MISCELLANEOUS) ×2 IMPLANT

## 2017-08-12 NOTE — Progress Notes (Addendum)
Pharmacy Antibiotic Note  [name] is a 62 y.o. female admitted on 08/11/2017 with wound infection.  Pharmacy has been consulted for zosyn and vancomycin dosing.  Plan: Renal function declined slightly over night. Due to poor renal function and pt size she is at risk for accumulation and toxicity. I will decrease pt dose to vancomycin 1500mg  q 24 hours Vancomycin trough 2/2@1530  Zosyn 3.375g q 8 hr EI infusion  Height: 5\' 8"  (172.7 cm) Weight: 300 lb (136.1 kg) IBW/kg (Calculated) : 63.9  Temp (24hrs), Avg:98 F (36.7 C), Min:97.5 F (36.4 C), Max:98.7 F (37.1 C)  Recent Labs  Lab 08/05/17 1557 08/11/17 1435 08/12/17 0423  WBC  --  5.7 5.6  CREATININE 1.81* 1.95* 2.27*    Estimated Creatinine Clearance: 39.1 mL/min (A) (by C-G formula based on SCr of 2.27 mg/dL (H)).    Allergies  Allergen Reactions  . Shellfish Allergy Anaphylaxis and Swelling  . Diazepam Other (See Comments)    "felt like out of body experience"  . Morphine And Related Itching    Antimicrobials this admission: Anti-infectives (From admission, onward)   Start     Dose/Rate Route Frequency Ordered Stop   08/12/17 1600  vancomycin (VANCOCIN) 1,500 mg in sodium chloride 0.9 % 500 mL IVPB     1,500 mg 250 mL/hr over 120 Minutes Intravenous Every 24 hours 08/12/17 1305     08/12/17 1202  piperacillin-tazobactam (ZOSYN) 3.375 GM/50ML IVPB    Comments:  Lyman Bishop   : cabinet override      08/12/17 1202 08/12/17 1224   08/11/17 1615  [MAR Hold]  piperacillin-tazobactam (ZOSYN) IVPB 3.375 g     (MAR Hold since 08/12/17 1140)   3.375 g 12.5 mL/hr over 240 Minutes Intravenous Every 8 hours 08/11/17 1602     08/11/17 1600  vancomycin (VANCOCIN) 2,000 mg in sodium chloride 0.9 % 500 mL IVPB  Status:  Discontinued     2,000 mg 250 mL/hr over 120 Minutes Intravenous Every 24 hours 08/11/17 1558 08/12/17 1305       Microbiology results: Recent Results (from the past 240 hour(s))  Surgical pcr  screen     Status: None   Collection Time: 08/12/17  1:31 AM  Result Value Ref Range Status   MRSA, PCR NEGATIVE NEGATIVE Final   Staphylococcus aureus NEGATIVE NEGATIVE Final    Comment: (NOTE) The Xpert SA Assay (FDA approved for NASAL specimens in patients 44 years of age and older), is one component of a comprehensive surveillance program. It is not intended to diagnose infection nor to guide or monitor treatment. Performed at [facility], [address] [place], [place] [address]     Thank you for allowing pharmacy to be a part of this patient's care.  Ramond Dial, Pharm.D, BCPS Clinical Pharmacist  08/12/2017 1:06 PM

## 2017-08-12 NOTE — Progress Notes (Signed)
Reviewed plans for I&D this AM. Renal function down. Will supplement IV fluids prior to procedure.

## 2017-08-12 NOTE — Progress Notes (Signed)
Patient returned from surgery and bp was 98/55. Notified MD Verdell Carmine and MD Marlyn Corporal. Both agreed that BP medications could be held.

## 2017-08-12 NOTE — Anesthesia Post-op Follow-up Note (Signed)
Anesthesia QCDR form completed.        

## 2017-08-12 NOTE — Anesthesia Procedure Notes (Signed)
Procedure Name: Intubation Date/Time: 08/12/2017 12:23 PM Performed by: Eben Burow, CRNA Pre-anesthesia Checklist: Patient identified, Emergency Drugs available, Suction available, Patient being monitored and Timeout performed Patient Re-evaluated:Patient Re-evaluated prior to induction Oxygen Delivery Method: Circle system utilized Preoxygenation: Pre-oxygenation with 100% oxygen Induction Type: IV induction and Rapid sequence Laryngoscope Size: Miller and 2 Grade View: Grade I Tube type: Oral Tube size: 7.5 mm Number of attempts: 1 Airway Equipment and Method: Stylet Placement Confirmation: ETT inserted through vocal cords under direct vision,  positive ETCO2 and breath sounds checked- equal and bilateral Secured at: 22 cm Tube secured with: Tape Dental Injury: Teeth and Oropharynx as per pre-operative assessment

## 2017-08-12 NOTE — Transfer of Care (Signed)
Immediate Anesthesia Transfer of Care Note  Patient: [name]  Procedure(s) Performed: INCISION AND DRAINAGE ABSCESS (Left Thigh)  Patient Location: PACU  Anesthesia Type:General  Level of Consciousness: awake, alert , oriented and patient cooperative  Airway & Oxygen Therapy: Patient Spontanous Breathing and Patient connected to face mask oxygen  Post-op Assessment: Report given to RN and Post -op Vital signs reviewed and stable  Post vital signs: Reviewed and stable  Last Vitals:  Vitals:   08/12/17 1148 08/12/17 1335  BP: 125/60 125/61  Pulse: 70 74  Resp: 18 14  Temp: (!) 36.4 C (!) 36 C  SpO2: 100% 100%    Last Pain:  Vitals:   08/12/17 1148  TempSrc: Temporal  PainSc:       Patients Stated Pain Goal: 1 (03/88/82 8003)  Complications: No apparent anesthesia complications

## 2017-08-12 NOTE — Plan of Care (Signed)
Patient going for I&D in the afternoon.  [name]

## 2017-08-12 NOTE — Progress Notes (Signed)
Anticoagulation monitoring(Lovenox):  62 yo female ordered Lovenox 30 mg Q24h  Filed Weights   08/11/17 1800 08/12/17 1148  Weight: 300 lb (136.1 kg) 300 lb (136.1 kg)   BMI 45.63   Lab Results  Component Value Date   CREATININE 2.27 (H) 08/12/2017   CREATININE 1.95 (H) 08/11/2017   CREATININE 1.81 (H) 08/05/2017   Estimated Creatinine Clearance: 39.1 mL/min (A) (by C-G formula based on SCr of 2.27 mg/dL (H)). Hemoglobin & Hematocrit     Component Value Date/Time   HGB 10.2 (L) 08/12/2017 0423   HGB 10.0 (L) 06/15/2017 1202   HCT 30.6 (L) 08/12/2017 0423   HCT 30.3 (L) 06/15/2017 1202     Per Protocol for Patient with estCrcl > 30 ml/min and BMI > 40, will transition to Lovenox 40 mg Q12h.

## 2017-08-12 NOTE — Anesthesia Preprocedure Evaluation (Signed)
Anesthesia Evaluation  Patient identified by MRN, date of birth, ID band Patient awake    Reviewed: Allergy & Precautions, H&P , NPO status , Patient's Chart, lab work & pertinent test results, reviewed documented beta blocker date and time   History of Anesthesia Complications (+) history of anesthetic complications  Airway Mallampati: III  TM Distance: >3 FB Neck ROM: full    Dental  (+) Teeth Intact   Pulmonary neg pulmonary ROS, asthma , sleep apnea and Continuous Positive Airway Pressure Ventilation , former smoker,    Pulmonary exam normal        Cardiovascular Exercise Tolerance: Poor hypertension, On Medications + angina with exertion + CAD and +CHF  negative cardio ROS Normal cardiovascular exam+ Valvular Problems/Murmurs AS  Rhythm:regular Rate:Normal     Neuro/Psych PSYCHIATRIC DISORDERS negative neurological ROS  negative psych ROS   GI/Hepatic negative GI ROS, Neg liver ROS, GERD  Medicated,(+) Hepatitis -  Endo/Other  negative endocrine ROSdiabetes  Renal/GU Renal diseasenegative Renal ROS  negative genitourinary   Musculoskeletal   Abdominal   Peds  Hematology negative hematology ROS (+)   Anesthesia Other Findings Past Medical History: No date: Aortic stenosis     Comment:  Echo 8/18: mean 13, peak 28, LVOT/AV mean velocity 0.51 No date: Asthma     Comment:  As a child  No date: Bronchitis No date: CAD (coronary artery disease)     Comment:  a. 09/2016: 50% Ost 1st Mrg stenosis, 50% 2nd Mrg               stenosis, 20% Mid-Cx, 95% Prox LAD, 40% mid-LAD, and 10%               dist-LAD stenosis. Staged PCI with DES to Prox-LAD.  2011: Chronic combined systolic and diastolic CHF (congestive heart  failure) (HCC)     Comment:  echo 2/18: EF 55-60, normal wall motion, grade 2               diastolic dysfunction, trivial AI // echo 3/18: Septal               and apical HK, EF 45-50, normal wall  motion, trivial AI,               mild LAE, PASP 38 // echo 8/18: EF 60-65, normal wall               motion, grade 1 diastolic dysfunction, calcified aortic               valve leaflets, mild aortic stenosis (mean 13, peak 28,               LVOT/AV mean velocity 0.51), mild AI, moderate MAC, mild               LAE, trivial TR  No date: Complication of anesthesia Dx 1989: Diabetes mellitus Dx 2013: Hepatitis C Dx 1989: Hypertension No date: Obesity 2013: Pancreatitis No date: Refusal of blood transfusions as patient is Jehov[employer]'s Witness No date: Tendinitis 2010: Ulcer Past Surgical History: No date: CHOLECYSTECTOMY 09/18/2016: CORONARY STENT INTERVENTION; N/A     Comment:  Procedure: Coronary Stent Intervention;  Surgeon: Troy Sine, MD;  Location: [place] [facility];  Service:               Cardiovascular;  Laterality: N/A; No date: EYE SURGERY No date: KNEE ARTHROSCOPY 09/18/2016: LEFT  HEART CATH; N/A     Comment:  Procedure: Left Heart Cath;  Surgeon: Troy Sine,               MD;  Location: [place] [facility];  Service:               Cardiovascular;  Laterality: N/A; 09/16/2016: LEFT HEART CATH AND CORONARY ANGIOGRAPHY; N/A     Comment:  Procedure: Left Heart Cath and Coronary Angiography;                Surgeon: Burnell Blanks, MD;  Location: [place] [facility];  Service: Cardiovascular;  Laterality:               N/A; 04/29/2017: LEFT HEART CATH AND CORONARY ANGIOGRAPHY; N/A     Comment:  Procedure: LEFT HEART CATH AND CORONARY ANGIOGRAPHY;                Surgeon: Nelva Bush, MD;  Location: [place] [facility];  Service: Cardiovascular;  Laterality: N/A; No date: TUBAL LIGATION No date: TUBAL LIGATION BMI    Body Mass Index:  45.61 kg/m     Reproductive/Obstetrics negative OB ROS                             Anesthesia Physical Anesthesia Plan  ASA: IV and emergent  Anesthesia Plan:  General ETT   Post-op Pain Management:    Induction:   PONV Risk Score and Plan: 4 or greater  Airway Management Planned:   Additional Equipment:   Intra-op Plan:   Post-operative Plan:   Informed Consent: I have reviewed the patients History and Physical, chart, labs and discussed the procedure including the risks, benefits and alternatives for the proposed anesthesia with the patient or authorized representative who has indicated his/her understanding and acceptance.   Dental Advisory Given  Plan Discussed with: CRNA  Anesthesia Plan Comments:         Anesthesia Quick Evaluation

## 2017-08-12 NOTE — Progress Notes (Signed)
[facility] [place] NAME: [name]    MR#:  [id number]  DATE OF BIRTH:  Jan 05, 1956  SUBJECTIVE:   Patient here due to left thigh abscess, directly admitted from surgeon's office. Patient going for incision and drainage and wound VAC placement to the left thigh abscess today. No other complaints presently.  REVIEW OF SYSTEMS:    Review of Systems  Constitutional: Negative for chills and fever.  HENT: Negative for congestion and tinnitus.   Eyes: Negative for blurred vision and double vision.  Respiratory: Negative for cough, shortness of breath and wheezing.   Cardiovascular: Negative for chest pain, orthopnea and PND.  Gastrointestinal: Negative for abdominal pain, diarrhea, nausea and vomiting.  Genitourinary: Negative for dysuria and hematuria.  Neurological: Negative for dizziness, sensory change and focal weakness.  All other systems reviewed and are negative.   Nutrition: NPO for surgery Tolerating Diet: NO Tolerating PT: Await Eval.    DRUG ALLERGIES:   Allergies  Allergen Reactions  . Shellfish Allergy Anaphylaxis and Swelling  . Diazepam Other (See Comments)    "felt like out of body experience"  . Morphine And Related Itching    VITALS:  Blood pressure (!) 98/55, pulse 65, temperature (!) 97.5 F (36.4 C), resp. rate 16, height 5\' 8"  (1.727 m), weight 136.1 kg (300 lb), SpO2 97 %.  PHYSICAL EXAMINATION:   Physical Exam  GENERAL:  61 y.o.-year-old morbidly obese patient lying in bed in no acute distress.  EYES: Pupils equal, round, reactive to light and accommodation. No scleral icterus. Extraocular muscles intact.  HEENT: Head atraumatic, normocephalic. Oropharynx and nasopharynx clear.  NECK:  Supple, no jugular venous distention. No thyroid enlargement, no tenderness.  LUNGS: Normal breath sounds bilaterally, no wheezing, rales, rhonchi. No use of accessory muscles of respiration.  CARDIOVASCULAR: S1, S2  normal. No murmurs, rubs, or gallops.  ABDOMEN: Soft, nontender, nondistended. Bowel sounds present. No organomegaly or mass.  EXTREMITIES: No cyanosis, clubbing or edema b/l.  Left Thigh wound/abscess with induration and fluctuance but no acute drainange.   NEUROLOGIC: Cranial nerves II through XII are intact. No focal Motor or sensory deficits b/l.   PSYCHIATRIC: The patient is alert and oriented x 3.  SKIN: No obvious rash, lesion, or ulcer.    LABORATORY PANEL:   CBC Recent Labs  Lab 08/12/17 0423  WBC 5.6  HGB 10.2*  HCT 30.6*  PLT 224   ------------------------------------------------------------------------------------------------------------------  Chemistries  Recent Labs  Lab 08/11/17 1435 08/12/17 0423  NA 135 134*  K 3.8 3.8  CL 101 103  CO2 22 21*  GLUCOSE 422* 347*  BUN 50* 56*  CREATININE 1.95* 2.27*  CALCIUM 8.5* 8.0*  AST 49*  --   ALT 36  --   ALKPHOS 260*  --   BILITOT 0.5  --    ------------------------------------------------------------------------------------------------------------------  Cardiac Enzymes No results for input(s): TROPONINI in the last 168 hours. ------------------------------------------------------------------------------------------------------------------  RADIOLOGY:  Ct Femur Left W Contrast  Result Date: 08/12/2017 CLINICAL DATA:  Left thigh abscess. EXAM: CT OF THE LOWER RIGHT EXTREMITY WITH CONTRAST TECHNIQUE: Multidetector CT imaging of the lower right extremity was performed according to the standard protocol following intravenous contrast administration. COMPARISON:  CT scan 06/05/2017 CONTRAST:  67mL ISOVUE-300 IOPAMIDOL (ISOVUE-300) INJECTION 61% FINDINGS: There is a persistent or recurrent subcutaneous abscess involving the left anteromedial thigh. Significant surrounding inflammation and phlegmonous changes along with focal cellulitis and skin thickening. The entire area measures approximately 9.7 x 6.3  cm. There is  some fluid and edema surrounding the deep muscular fascia but I do not see any findings for myofasciitis or pyomyositis. Left hip joint degenerative changes but no evidence of septic arthritis. No findings to suggest osteomyelitis involving the left femur. Moderate left knee joint degenerative changes but no joint effusion or septic arthritis. Mildly enlarged/inflamed left inguinal lymph nodes. IMPRESSION: Recurrent left medial anterior thigh abscess and cellulitis. Electronically Signed   By: Marijo Sanes M.D.   On: 08/12/2017 09:12     ASSESSMENT AND PLAN:   62 year old female with past medical history of hepatitis C, diabetes, chronic kidney disease stage IV, history of coronary artery disease, history of chronic diastolic CHF who presented to the hospital directly from the surgeon's office due to a left thigh abscess.  1.left thigh abscess-patient was directly admitted from the surgeon's office. - Plan for debridement and incision and drainage of the left thigh abscess with wound VAC placement today. -continue empiric antibiotics with vancomycin, Zosyn. - will target abx once intraoperative cultures come back.   2. History of coronary artery disease- no acute chest pain. - cont. Coreg, Brillinta, Statin.   3. DM Type II w/out complication - continue Lantus, NovoLog with meals and sliding scale insulin coverage.  4. Hyperlipidemia-continue atorvastatin.  5. diabetic neuropathy-continue gabapentin.  6. Essential hypertension-continue carvedilol, Norvasc, hydralazine, Imdur  7. GERD - cont. Protonix.    All the records are reviewed and case discussed with Care Management/Social Worker. Management plans discussed with the patient, family and they are in agreement.  CODE STATUS: Full code  DVT Prophylaxis: Hep SQ  TOTAL TIME TAKING CARE OF THIS PATIENT: 30 minutes.   POSSIBLE D/C IN 2-3 DAYS, DEPENDING ON CLINICAL CONDITION.   Henreitta Leber M.D on 08/12/2017 at 3:05  PM  Between 7am to 6pm - Pager - [phone]  After 6pm go to [email or web address] - Technical brewer [facility]  Office  [phone]  CC: Primary care physician; [name], PA-C

## 2017-08-12 NOTE — Progress Notes (Signed)
Inpatient Diabetes Program Recommendations  AACE/ADA: New Consensus Statement on Inpatient Glycemic Control (2015)  Target Ranges:  Prepandial:   less than 140 mg/dL      Peak postprandial:   less than 180 mg/dL (1-2 hours)      Critically ill patients:  140 - 180 mg/dL   Lab Results  Component Value Date   GLUCAP 285 (H) 08/11/2017   HGBA1C 9.1 (H) 08/11/2017    Review of Glycemic Control  Results for [name], [name] (MRN [id number]) as of 08/12/2017 08:33  Ref. Range 08/11/2017 13:42 08/11/2017 15:17 08/11/2017 16:36 08/11/2017 21:17 08/12/2017 08:19  Glucose-Capillary Latest Ref Range: 65 - 99 mg/dL 407 (H) 446 (H) 350 (H) 285 (H) 294 (H)    Diabetes history: Type 2 Outpatient Diabetes medications: Novolog 25 units tid, Lantus 40 units bid   Current orders for Inpatient glycemic control: Lantus 40 units bid, Novolog 5 units tid, Novolog 0-15 units tid, Novolog 0-5 units qhs  Inpatient Diabetes Program Recommendations: Consider increasing Lantus to 45 units bid, increase Novolog mealtime insulin to 10 units tid (hold if patient eats less than 50%)- continue Novolog correction as ordered.   Gentry Fitz, RN, BA, MHA, CDE Diabetes Coordinator Inpatient Diabetes Program  [phone] (Team Pager) [phone] ([place]) 08/12/2017 8:35 AM

## 2017-08-12 NOTE — Op Note (Signed)
Preoperative diagnosis: Left medial thigh abscess.  Postoperative diagnosis: Same.  Operative procedure: Debridement left medial thigh including skin and soft tissue.  Placement of wound VAC.  Operating Surgeon: Hervey Ard, MD.  Anesthesia: General endotracheal.  Estimated blood loss: 100 cc.  Clinical note: This 62 year old woman with long-standing diabetes and essential hypertension, biventricular heart failure was seen in November 2018 with an abscess in the left medial thigh.  She underwent incision and drainage and admission, but the procedure was completed in the emergency room under local anesthesia.  CT scan showed decreased size of the abscess and she was discharged home with outpatient antibiotics.  Over the last several weeks she has had increasing pain, swelling and drainage and presented to her PCP yesterday.  At that time her blood sugar was noted to be 420 on admission and while afebrile and with a normal CBC she was felt to be a candidate for operative debridement.  CT scan completed yesterday showed the infectious process to be above the level of the fascia.  This encompasses an area approximately 12 cm in greatest diameter.  There had been a report of extensive bleeding at the time of the November incision and drainage and the only superficial vessel noted on the CT scan was the saphenous vein which was well lateral to the abscess site.  Operative note: With the patient under adequate general endotracheal anesthesia heels padded and the left hip flexed and externally rotated the left thigh was cleansed with ChloraPrep and draped.  A 3.5 x 11 cm ellipse of skin to encompass most of the draining sinuses was excised with hemostasis achieved by electrocautery.  This was essentially longitudinally/obliquely orientated along the distal half of the medial thigh.  Several pockets of grossly purulent material consistent with staph were identified and a culture for aerobic and anaerobic  organisms was obtained.  The chronically inflamed tissue was excised and the final size of the wound was approximately 8 x 13 cm.  This extended down to toward but did not traverse the deep fascia.  Once the chronic inflammatory tissue was excised and other areas debrided with a curette fine bleeding points were controlled with electrocautery.  The wound was irrigated with saline.  A 2 layer black wound VAC gauze was gently packed into the wound and the occlusive dressing applied.  Pressure was set at -125 mmHg and good adherence of the dressing was noted.  The patient tolerated the procedure well and was taken to the recovery room in stable condition.

## 2017-08-13 LAB — CBC
HCT: 28.8 % — ABNORMAL LOW (ref 35.0–47.0)
Hemoglobin: 9.4 g/dL — ABNORMAL LOW (ref 12.0–16.0)
MCH: 27.6 pg (ref 26.0–34.0)
MCHC: 32.7 g/dL (ref 32.0–36.0)
MCV: 84.4 fL (ref 80.0–100.0)
Platelets: 213 10*3/uL (ref 150–440)
RBC: 3.42 MIL/uL — ABNORMAL LOW (ref 3.80–5.20)
RDW: 15.9 % — ABNORMAL HIGH (ref 11.5–14.5)
WBC: 6.8 10*3/uL (ref 3.6–11.0)

## 2017-08-13 LAB — BASIC METABOLIC PANEL
Anion gap: 11 (ref 5–15)
BUN: 56 mg/dL — ABNORMAL HIGH (ref 6–20)
CO2: 19 mmol/L — ABNORMAL LOW (ref 22–32)
Calcium: 8.1 mg/dL — ABNORMAL LOW (ref 8.9–10.3)
Chloride: 105 mmol/L (ref 101–111)
Creatinine, Ser: 2.62 mg/dL — ABNORMAL HIGH (ref 0.44–1.00)
GFR calc Af Amer: 22 mL/min — ABNORMAL LOW (ref >60)
GFR calc non Af Amer: 19 mL/min — ABNORMAL LOW (ref >60)
Glucose, Bld: 216 mg/dL — ABNORMAL HIGH (ref 65–99)
Potassium: 4.2 mmol/L (ref 3.5–5.1)
Sodium: 135 mmol/L (ref 135–145)

## 2017-08-13 LAB — GLUCOSE, CAPILLARY
Glucose-Capillary: 234 mg/dL — ABNORMAL HIGH (ref 65–99)
Glucose-Capillary: 217 mg/dL — ABNORMAL HIGH (ref 65–99)
Glucose-Capillary: 186 mg/dL — ABNORMAL HIGH (ref 65–99)
Glucose-Capillary: 217 mg/dL — ABNORMAL HIGH (ref 65–99)

## 2017-08-13 LAB — SURGICAL PATHOLOGY

## 2017-08-13 NOTE — Anesthesia Postprocedure Evaluation (Signed)
Anesthesia Post Note  Patient: [name]  Procedure(s) Performed: INCISION AND DRAINAGE ABSCESS (Left Thigh)  Patient location during evaluation: PACU Anesthesia Type: General Level of consciousness: awake and alert Pain management: pain level controlled Vital Signs Assessment: post-procedure vital signs reviewed and stable Respiratory status: spontaneous breathing, nonlabored ventilation, respiratory function stable and patient connected to nasal cannula oxygen Cardiovascular status: blood pressure returned to baseline and stable Postop Assessment: no apparent nausea or vomiting Anesthetic complications: no     Last Vitals:  Vitals:   08/13/17 1139 08/13/17 1445  BP: (!) 143/60 126/60  Pulse: 70 67  Resp: 20 20  Temp: 36.7 C   SpO2: 98% 100%    Last Pain:  Vitals:   08/13/17 1524  TempSrc:   PainSc: 5                  Molli Barrows

## 2017-08-13 NOTE — Progress Notes (Signed)
Inpatient Diabetes Program Recommendations  AACE/ADA: New Consensus Statement on Inpatient Glycemic Control (2015)  Target Ranges:  Prepandial:   less than 140 mg/dL      Peak postprandial:   less than 180 mg/dL (1-2 hours)      Critically ill patients:  140 - 180 mg/dL   Lab Results  Component Value Date   GLUCAP 234 (H) 08/13/2017   HGBA1C 9.1 (H) 08/11/2017    Review of Glycemic Control  Results for [name], [name] (MRN [id number]) as of 08/13/2017 08:16  Ref. Range 08/12/2017 11:58 08/12/2017 13:42 08/12/2017 17:10 08/12/2017 21:36 08/13/2017 07:29  Glucose-Capillary Latest Ref Range: 65 - 99 mg/dL 310 (H) 237 (H) 184 (H) 198 (H) 234 (H)    Diabetes history: Type 2 Outpatient Diabetes medications: Novolog 25 units tid, Lantus 40 units bid   Current orders for Inpatient glycemic control: Lantus 44 units bid, Novolog 10 units tid, Novolog 0-15 units tid, Novolog 0-5 units qhs  Inpatient Diabetes Program Recommendations: Patient did not receive bid insulin yesterday as ordered likely resulting in elevated CBG today.  No medication changes recommended for today-   Gentry Fitz, RN, [place], , CDE Diabetes Coordinator Inpatient Diabetes Program  [phone] (Team Pager) [phone] ([place]) 08/13/2017 8:22 AM

## 2017-08-13 NOTE — Care Management (Signed)
[name] from [facility] states that case was closed in December.  They are not able to accept the patient back due to nursing staff.  Brad from [facility] a heads up on need for home health nursing.  [name] has been provided the hard copy of the wound vac order form, and is aware that patient will likely discharge over the weekend.

## 2017-08-13 NOTE — Care Management (Addendum)
Patient admitted with left thigh abscess.  Patient states post debridement.  Patient with wound vac in place.  Place to discharge home with wound vac.  Per Dr. Bary Castilla he would like to follow up with patient in office for initial dressing change on Tuesday, the home health to follow up with the continued dressing changes.  Vac order form has been signed.  Brad with [facility] notified of order for Vac  Patient was previously referred to [facility] health on 05/27/17.  Message left with Adonis Brook with [facility] to determine if patient is still open .  PCP Dr Marlyn Corporal .  Patient has RW and BSC in the home.  RNCM following.

## 2017-08-13 NOTE — Progress Notes (Signed)
[place] [place] NAME: [name]    MR#:  [id number]  DATE OF BIRTH:  [birth year]  SUBJECTIVE:   Patient is status post incision and drainage of the left thigh abscess with wound VAC placement. Renal function has not improved. Patient still having some pain in the left thigh near his wound VAC.  REVIEW OF SYSTEMS:    Review of Systems  Constitutional: Negative for chills and fever.  HENT: Negative for congestion and tinnitus.   Eyes: Negative for blurred vision and double vision.  Respiratory: Negative for cough, shortness of breath and wheezing.   Cardiovascular: Negative for chest pain, orthopnea and PND.  Gastrointestinal: Negative for abdominal pain, diarrhea, nausea and vomiting.  Genitourinary: Negative for dysuria and hematuria.  Neurological: Negative for dizziness, sensory change and focal weakness.  All other systems reviewed and are negative.   Nutrition: Carb control Tolerating Diet: Yes Tolerating PT: Await Eval.   DRUG ALLERGIES:   Allergies  Allergen Reactions  . Shellfish Allergy Anaphylaxis and Swelling  . Diazepam Other (See Comments)    "felt like out of body experience"  . Morphine And Related Itching    VITALS:  Blood pressure (!) 143/60, pulse 70, temperature 98.1 F (36.7 C), temperature source Oral, resp. rate 20, height 5\' 8"  (1.727 m), weight 136.1 kg (300 lb), SpO2 98 %.  PHYSICAL EXAMINATION:   Physical Exam  GENERAL:  62 y.o.-year-old morbidly obese patient lying in bed in no acute distress.  EYES: Pupils equal, round, reactive to light and accommodation. No scleral icterus. Extraocular muscles intact.  HEENT: Head atraumatic, normocephalic. Oropharynx and nasopharynx clear.  NECK:  Supple, no jugular venous distention. No thyroid enlargement, no tenderness.  LUNGS: Normal breath sounds bilaterally, no wheezing, rales, rhonchi. No use of accessory muscles of respiration.  CARDIOVASCULAR:  S1, S2 normal. No murmurs, rubs, or gallops.  ABDOMEN: Soft, nontender, nondistended. Bowel sounds present. No organomegaly or mass.  EXTREMITIES: No cyanosis, clubbing or edema b/l.  Left Thigh wound vac in place  NEUROLOGIC: Cranial nerves II through XII are intact. No focal Motor or sensory deficits b/l.   PSYCHIATRIC: The patient is alert and oriented x 3.  SKIN: No obvious rash, lesion, or ulcer.    LABORATORY PANEL:   CBC Recent Labs  Lab 08/13/17 0312  WBC 6.8  HGB 9.4*  HCT 28.8*  PLT 213   ------------------------------------------------------------------------------------------------------------------  Chemistries  Recent Labs  Lab 08/11/17 1435  08/13/17 0312  NA 135   < > 135  K 3.8   < > 4.2  CL 101   < > 105  CO2 22   < > 19*  GLUCOSE 422*   < > 216*  BUN 50*   < > 56*  CREATININE 1.95*   < > 2.62*  CALCIUM 8.5*   < > 8.1*  AST 49*  --   --   ALT 36  --   --   ALKPHOS 260*  --   --   BILITOT 0.5  --   --    < > = values in this interval not displayed.   ------------------------------------------------------------------------------------------------------------------  Cardiac Enzymes No results for input(s): TROPONINI in the last 168 hours. ------------------------------------------------------------------------------------------------------------------  RADIOLOGY:  Ct Femur Left W Contrast  Result Date: 08/12/2017 CLINICAL DATA:  Left thigh abscess. EXAM: CT OF THE LOWER RIGHT EXTREMITY WITH CONTRAST TECHNIQUE: Multidetector CT imaging of the lower right extremity was performed according to the standard protocol  following intravenous contrast administration. COMPARISON:  CT scan 06/05/2017 CONTRAST:  26mL ISOVUE-300 IOPAMIDOL (ISOVUE-300) INJECTION 61% FINDINGS: There is a persistent or recurrent subcutaneous abscess involving the left anteromedial thigh. Significant surrounding inflammation and phlegmonous changes along with focal cellulitis and skin  thickening. The entire area measures approximately 9.7 x 6.3 cm. There is some fluid and edema surrounding the deep muscular fascia but I do not see any findings for myofasciitis or pyomyositis. Left hip joint degenerative changes but no evidence of septic arthritis. No findings to suggest osteomyelitis involving the left femur. Moderate left knee joint degenerative changes but no joint effusion or septic arthritis. Mildly enlarged/inflamed left inguinal lymph nodes. IMPRESSION: Recurrent left medial anterior thigh abscess and cellulitis. Electronically Signed   By: Marijo Sanes M.D.   On: 08/12/2017 09:12     ASSESSMENT AND PLAN:   62 year old female with past medical history of hepatitis C, diabetes, chronic kidney disease stage IV, history of coronary artery disease, history of chronic diastolic CHF who presented to the hospital directly from the surgeon's office due to a left thigh abscess.  1.left thigh abscess-patient was directly admitted from the surgeon's office. -status post incision and drainage and wound VAC placement by general surgery yesterday. Plan is to take the patient back to the OR tomorrow for changing the wound VAC. She'll likely to go home with the wound VAC. -Continue empiric antibiotics with vancomycin, Zosyn. - will target abx once intraoperative cultures come back.   2. Acute on chronic kidney injury-patient's baseline creatinine is around 2.0, currently creatinine is 2.6. -Continue gentle IV fluids, follow BUN/creatinine. Renal dose meds, avoid nephrotoxins.  3. History of coronary artery disease- no acute chest pain. - cont. Coreg, Brillinta, Statin.   4. DM Type II w/out complication - continue Lantus, NovoLog with meals and sliding scale insulin coverage. - BS stable.   5. Hyperlipidemia-continue atorvastatin.  6. diabetic neuropathy-continue gabapentin.  7. Essential hypertension-continue carvedilol, Norvasc, hydralazine, Imdur  8. GERD - cont. Protonix.     All the records are reviewed and case discussed with Care Management/Social Worker. Management plans discussed with the patient, family and they are in agreement.  CODE STATUS: Full code  DVT Prophylaxis: Hep SQ  TOTAL TIME TAKING CARE OF THIS PATIENT: 30 minutes.   POSSIBLE D/C IN 1-2 DAYS, DEPENDING ON CLINICAL CONDITION.   Henreitta Leber M.D on 08/13/2017 at 2:41 PM  Between 7am to 6pm - Pager - [phone]  After 6pm go to [email or web address] - Technical brewer [facility] [facility]  Office  [phone]  CC: Primary care physician; [name], PA-C

## 2017-08-13 NOTE — Progress Notes (Signed)
AVSS. Blood sugars improved over admission.  Pain at surgical site, otherwise feeling well. No report of SOB/ CP. Leg: Wound vac intact. Exposed edges pink. Labs: Worsening renal failure post contrast CT, creatinine up to 2.6. Lytes ok, mild fall in CO2. WBC: Normal. Minimal fall in HGB post estimated 100 cc blood loss. Gram stain: Gram neg rods only. Pus looked more like a Staph. Culture pending. Plan: Wound vac change in OR tomorrow. Likely D/C home w/ wound vac and outpatient dressing changes in office. D/C will depend on renal function and choice of oral antibiotic.

## 2017-08-14 ENCOUNTER — Encounter: Payer: Self-pay | Admitting: Anesthesiology

## 2017-08-14 ENCOUNTER — Encounter
Admission: AD | Disposition: A | Discharge status: Home Health | Payer: Self-pay | Source: Ambulatory Visit | Attending: Internal Medicine

## 2017-08-14 ENCOUNTER — Inpatient Hospital Stay: Payer: Medicaid Other | Admitting: Registered Nurse

## 2017-08-14 ENCOUNTER — Ambulatory Visit: Admit: 2017-08-14 | Payer: Medicaid Other | Admitting: General Surgery

## 2017-08-14 HISTORY — PX: APPLICATION OF WOUND VAC: SHX5189

## 2017-08-14 LAB — BASIC METABOLIC PANEL
Anion gap: 9 (ref 5–15)
BUN: 62 mg/dL — ABNORMAL HIGH (ref 6–20)
CO2: 19 mmol/L — ABNORMAL LOW (ref 22–32)
Calcium: 7.6 mg/dL — ABNORMAL LOW (ref 8.9–10.3)
Chloride: 106 mmol/L (ref 101–111)
Creatinine, Ser: 3.37 mg/dL — ABNORMAL HIGH (ref 0.44–1.00)
GFR calc Af Amer: 16 mL/min — ABNORMAL LOW (ref >60)
GFR calc non Af Amer: 14 mL/min — ABNORMAL LOW (ref >60)
Glucose, Bld: 249 mg/dL — ABNORMAL HIGH (ref 65–99)
Potassium: 4.8 mmol/L (ref 3.5–5.1)
Sodium: 134 mmol/L — ABNORMAL LOW (ref 135–145)

## 2017-08-14 LAB — CBC
HCT: 27.1 % — ABNORMAL LOW (ref 35.0–47.0)
Hemoglobin: 8.8 g/dL — ABNORMAL LOW (ref 12.0–16.0)
MCH: 27.5 pg (ref 26.0–34.0)
MCHC: 32.5 g/dL (ref 32.0–36.0)
MCV: 84.4 fL (ref 80.0–100.0)
Platelets: 225 10*3/uL (ref 150–440)
RBC: 3.21 MIL/uL — ABNORMAL LOW (ref 3.80–5.20)
RDW: 16 % — ABNORMAL HIGH (ref 11.5–14.5)
WBC: 6.1 10*3/uL (ref 3.6–11.0)

## 2017-08-14 LAB — GLUCOSE, CAPILLARY
Glucose-Capillary: 237 mg/dL — ABNORMAL HIGH (ref 65–99)
Glucose-Capillary: 237 mg/dL — ABNORMAL HIGH (ref 65–99)
Glucose-Capillary: 230 mg/dL — ABNORMAL HIGH (ref 65–99)
Glucose-Capillary: 265 mg/dL — ABNORMAL HIGH (ref 65–99)
Glucose-Capillary: 243 mg/dL — ABNORMAL HIGH (ref 65–99)

## 2017-08-14 LAB — VANCOMYCIN, TROUGH: Vancomycin Tr: 31 ug/mL (ref 15–20)

## 2017-08-14 SURGERY — APPLICATION, WOUND VAC
Anesthesia: General | Site: Leg Upper | Laterality: Left | Wound class: Contaminated

## 2017-08-14 MED ORDER — ONDANSETRON HCL 4 MG/2ML IJ SOLN: 4.0000 mg | Freq: Once | INTRAMUSCULAR | Status: DC | PRN

## 2017-08-14 MED ORDER — PROPOFOL 10 MG/ML IV BOLUS
INTRAVENOUS | Status: AC
  Filled 2017-08-14: qty 20

## 2017-08-14 MED ORDER — FENTANYL CITRATE (PF) 100 MCG/2ML IJ SOLN
INTRAMUSCULAR | Status: AC
  Filled 2017-08-14: qty 2

## 2017-08-14 MED ORDER — VANCOMYCIN HCL IN DEXTROSE 1-5 GM/200ML-% IV SOLN
1000.0000 mg | INTRAVENOUS | Status: DC
  Administered 2017-08-14: 1000 mg via INTRAVENOUS
  Filled 2017-08-14 (×2): qty 200

## 2017-08-14 MED ORDER — FENTANYL CITRATE (PF) 100 MCG/2ML IJ SOLN
INTRAMUSCULAR | Status: AC
  Administered 2017-08-15: 25 ug via INTRAVENOUS
  Filled 2017-08-14: qty 2

## 2017-08-14 MED ORDER — INSULIN GLARGINE 100 UNIT/ML ~~LOC~~ SOLN
45.0000 [IU] | Freq: Two times a day (BID) | SUBCUTANEOUS | Status: DC
  Administered 2017-08-14 – 2017-08-16 (×4): 45 [IU] via SUBCUTANEOUS
  Filled 2017-08-14 (×5): qty 0.45

## 2017-08-14 MED ORDER — MIDAZOLAM HCL 2 MG/2ML IJ SOLN
INTRAMUSCULAR | Status: AC
  Filled 2017-08-14: qty 2

## 2017-08-14 MED ORDER — MIDAZOLAM HCL 2 MG/2ML IJ SOLN
INTRAMUSCULAR | Status: DC | PRN
  Administered 2017-08-14: 1 mg via INTRAVENOUS

## 2017-08-14 MED ORDER — LACTATED RINGERS IV SOLN
INTRAVENOUS | Status: DC | PRN
  Administered 2017-08-14: 10:00:00 via INTRAVENOUS

## 2017-08-14 MED ORDER — PROPOFOL 500 MG/50ML IV EMUL
INTRAVENOUS | Status: DC | PRN
  Administered 2017-08-14: 140 ug/kg/min via INTRAVENOUS

## 2017-08-14 MED ORDER — VALACYCLOVIR HCL 500 MG PO TABS
500.0000 mg | ORAL_TABLET | Freq: Every day | ORAL | Status: DC
  Administered 2017-08-14 – 2017-08-19 (×6): 500 mg via ORAL
  Filled 2017-08-14 (×6): qty 1

## 2017-08-14 MED ORDER — COLCHICINE 0.6 MG PO TABS
0.6000 mg | ORAL_TABLET | Freq: Every day | ORAL | Status: DC
  Administered 2017-08-14 – 2017-08-16 (×3): 0.6 mg via ORAL
  Filled 2017-08-14 (×3): qty 1

## 2017-08-14 MED ORDER — PROPOFOL 10 MG/ML IV BOLUS
INTRAVENOUS | Status: DC | PRN
  Administered 2017-08-14: 50 mg via INTRAVENOUS

## 2017-08-14 MED ORDER — FENTANYL CITRATE (PF) 100 MCG/2ML IJ SOLN
INTRAMUSCULAR | Status: DC | PRN
  Administered 2017-08-14: 50 ug via INTRAVENOUS

## 2017-08-14 MED ORDER — PEDIALYTE PO SOLN
240.0000 mL | Freq: Four times a day (QID) | ORAL | Status: DC
  Administered 2017-08-15 – 2017-08-16 (×3): 240 mL via ORAL

## 2017-08-14 MED ORDER — FENTANYL CITRATE (PF) 100 MCG/2ML IJ SOLN
25.0000 ug | INTRAMUSCULAR | Status: DC | PRN
  Administered 2017-08-14: 50 ug via INTRAVENOUS

## 2017-08-14 SURGICAL SUPPLY — 27 items
CANISTER SUCT 1200ML W/VALVE (MISCELLANEOUS) ×3 IMPLANT
CHLORAPREP W/TINT 26ML (MISCELLANEOUS) ×3 IMPLANT
DRAPE LAPAROTOMY 100X77 ABD (DRAPES) ×3 IMPLANT
DRSG TEGADERM 4X4.75 (GAUZE/BANDAGES/DRESSINGS) ×1 IMPLANT
DRSG TELFA 3X8 NADH (GAUZE/BANDAGES/DRESSINGS) IMPLANT
DRSG VAC ATS MED SENSATRAC (GAUZE/BANDAGES/DRESSINGS) ×2 IMPLANT
ELECT REM PT RETURN 9FT ADLT (ELECTROSURGICAL)
ELECTRODE REM PT RTRN 9FT ADLT (ELECTROSURGICAL) ×1 IMPLANT
GAUZE SPONGE 4X4 12PLY STRL (GAUZE/BANDAGES/DRESSINGS) ×1 IMPLANT
GLOVE BIO SURGEON STRL SZ7.5 (GLOVE) ×3 IMPLANT
GLOVE INDICATOR 8.0 STRL GRN (GLOVE) ×3 IMPLANT
GOWN STRL REUS W/ TWL LRG LVL3 (GOWN DISPOSABLE) ×4 IMPLANT
GOWN STRL REUS W/TWL LRG LVL3 (GOWN DISPOSABLE) ×6
KIT TURNOVER KIT A (KITS) ×3 IMPLANT
LABEL OR SOLS (LABEL) ×3 IMPLANT
NDL HYPO 25X1 1.5 SAFETY (NEEDLE) ×1 IMPLANT
NEEDLE HYPO 25X1 1.5 SAFETY (NEEDLE) ×3 IMPLANT
NS IRRIG 500ML POUR BTL (IV SOLUTION) ×3 IMPLANT
PACK BASIN MINOR ARMC (MISCELLANEOUS) ×3 IMPLANT
PAD DRESSING TELFA 3X8 NADH (GAUZE/BANDAGES/DRESSINGS) ×1 IMPLANT
STRIP CLOSURE SKIN 1/2X4 (GAUZE/BANDAGES/DRESSINGS) IMPLANT
SUT VIC AB 3-0 SH 27 (SUTURE)
SUT VIC AB 3-0 SH 27X BRD (SUTURE) IMPLANT
SUT VIC AB 4-0 FS2 27 (SUTURE) ×3 IMPLANT
SWABSTK COMLB BENZOIN TINCTURE (MISCELLANEOUS) ×3 IMPLANT
SYR BULB IRRIG 60ML STRL (SYRINGE) ×2 IMPLANT
SYR CONTROL 10ML (SYRINGE) ×3 IMPLANT

## 2017-08-14 NOTE — Op Note (Addendum)
Preoperative diagnosis: Abscess left thigh status post operative debridement.  Postoperative diagnosis: Same.  Operative procedure: Wound irrigation, wound VAC reapplication.  Operating Surgeon: Hervey Ard, MD.  Anesthesia: Sedation by anesthesia.  Estimated blood loss: None.  Clinical note: This 62 year old diabetic woman was taken to the operating room 2 days ago at which time she underwent extensive debridement of an abscess on the left distal medial thigh.  A wound VAC device was applied at that time and she returned to the operating that this time for dressing change.  Operative note: The patient was placed supine on the table and appropriately padded.  The area was cleansed with a non-iodine based cleanser after the previous wound VAC dressing is been removed.  Wound edges were healthy.  After draping there was noted to be about 30 cc of clot in the very base of the wound which was removed.  The wound was then irrigated with saline followed by peroxide followed by saline.  No necrotic tissue was identified.  A new black sponge was applied to fill the wound and the occlusive dressing applied.  VAC was resumed -125 mmHg.  The patient tolerated the procedure well and was taken to the recovery room in stable condition.  A photograph of the wound prior to reapplication of the [facility] device is visible in the media section of the chart.

## 2017-08-14 NOTE — Anesthesia Preprocedure Evaluation (Signed)
Anesthesia Evaluation  Patient identified by MRN, date of birth, ID band Patient awake    Reviewed: Allergy & Precautions, H&P , NPO status , Patient's Chart, lab work & pertinent test results, reviewed documented beta blocker date and time   History of Anesthesia Complications (+) history of anesthetic complications  Airway Mallampati: III  TM Distance: >3 FB Neck ROM: full    Dental  (+) Teeth Intact, Poor Dentition   Pulmonary neg pulmonary ROS, asthma , sleep apnea and Continuous Positive Airway Pressure Ventilation , former smoker,    Pulmonary exam normal        Cardiovascular Exercise Tolerance: Poor hypertension, On Medications + angina with exertion + CAD and +CHF  negative cardio ROS Normal cardiovascular exam+ Valvular Problems/Murmurs AS  Rhythm:regular Rate:Normal     Neuro/Psych PSYCHIATRIC DISORDERS negative neurological ROS  negative psych ROS   GI/Hepatic negative GI ROS, Neg liver ROS, GERD  Medicated,(+) Hepatitis -  Endo/Other  negative endocrine ROSdiabetes  Renal/GU Renal diseasenegative Renal ROS  negative genitourinary   Musculoskeletal   Abdominal   Peds  Hematology negative hematology ROS (+)   Anesthesia Other Findings Past Medical History: No date: Aortic stenosis     Comment:  Echo 8/18: mean 13, peak 28, LVOT/AV mean velocity 0.51 No date: Asthma     Comment:  As a child  No date: Bronchitis No date: CAD (coronary artery disease)     Comment:  a. 09/2016: 50% Ost 1st Mrg stenosis, 50% 2nd Mrg               stenosis, 20% Mid-Cx, 95% Prox LAD, 40% mid-LAD, and 10%               dist-LAD stenosis. Staged PCI with DES to Prox-LAD.  2011: Chronic combined systolic and diastolic CHF (congestive heart  failure) (HCC)     Comment:  echo 2/18: EF 55-60, normal wall motion, grade 2               diastolic dysfunction, trivial AI // echo 3/18: Septal               and apical HK, EF 45-50,  normal wall motion, trivial AI,               mild LAE, PASP 38 // echo 8/18: EF 60-65, normal wall               motion, grade 1 diastolic dysfunction, calcified aortic               valve leaflets, mild aortic stenosis (mean 13, peak 28,               LVOT/AV mean velocity 0.51), mild AI, moderate MAC, mild               LAE, trivial TR  No date: Complication of anesthesia Dx 1989: Diabetes mellitus Dx 2013: Hepatitis C Dx 1989: Hypertension No date: Obesity 2013: Pancreatitis No date: Refusal of blood transfusions as patient is Jehovah's Witness No date: Tendinitis 2010: Ulcer Past Surgical History: No date: CHOLECYSTECTOMY 09/18/2016: CORONARY STENT INTERVENTION; N/A     Comment:  Procedure: Coronary Stent Intervention;  Surgeon: Troy Sine, MD;  Location: [facility];  Service:               Cardiovascular;  Laterality: N/A; No date: EYE SURGERY No date: KNEE ARTHROSCOPY  09/18/2016: LEFT HEART CATH; N/A     Comment:  Procedure: Left Heart Cath;  Surgeon: Troy Sine,               MD;  Location: [facility];  Service:               Cardiovascular;  Laterality: N/A; 09/16/2016: LEFT HEART CATH AND CORONARY ANGIOGRAPHY; N/A     Comment:  Procedure: Left Heart Cath and Coronary Angiography;                Surgeon: Burnell Blanks, MD;  Location: [facility];  Service: Cardiovascular;  Laterality:               N/A; 04/29/2017: LEFT HEART CATH AND CORONARY ANGIOGRAPHY; N/A     Comment:  Procedure: LEFT HEART CATH AND CORONARY ANGIOGRAPHY;                Surgeon: Nelva Bush, MD;  Location: [facility];  Service: Cardiovascular;  Laterality: N/A; No date: TUBAL LIGATION No date: TUBAL LIGATION BMI    Body Mass Index:  45.61 kg/m     Reproductive/Obstetrics negative OB ROS                             Anesthesia Physical  Anesthesia Plan  ASA: IV  Anesthesia Plan:  General   Post-op Pain Management:    Induction:   PONV Risk Score and Plan: 4 or greater and Ondansetron and Dexamethasone  Airway Management Planned: LMA  Additional Equipment:   Intra-op Plan:   Post-operative Plan: Extubation in OR  Informed Consent: I have reviewed the patients History and Physical, chart, labs and discussed the procedure including the risks, benefits and alternatives for the proposed anesthesia with the patient or authorized representative who has indicated his/her understanding and acceptance.   Dental Advisory Given  Plan Discussed with: CRNA  Anesthesia Plan Comments:         Anesthesia Quick Evaluation

## 2017-08-14 NOTE — Evaluation (Signed)
Physical Therapy Evaluation Patient Details Name: [name] MRN: [id number] DOB: 02-14-1956 Today's Date: 08/14/2017   History of Present Illness  62 yo female with onset L thigh abscess was admitted for I and D with vac placed.  Had recent admission to [facility] for drainage and still has been unhealed.  Has had wound 2-3 months and is now having AKI.  PMHx:  DM, CAD, CKD, HTN, pancreatitis, aortic stenosis  Clinical Impression  Pt was seen for assessment of mobiltiy with significant pain and debility from her thigh wound and length of time with non healing.  Her plan is to ask for SNF stay to increase independence as she is  Home alone with adult children who work and cannot help much.  Follow for acute goals of strengthening, balance and gait on RW as tolerated to limit need for stay in SNF.    Follow Up Recommendations SNF    Equipment Recommendations  Rolling walker with 5" wheels    Recommendations for Other Services       Precautions / Restrictions Precautions Precautions: Fall Restrictions Weight Bearing Restrictions: No      Mobility  Bed Mobility Overal bed mobility: Needs Assistance Bed Mobility: Supine to Sit;Sit to Supine     Supine to sit: Mod assist Sit to supine: Mod assist   General bed mobility comments: mainly help to slide over LLE and then support trunk to sit up then assist for LE's back to bed  Transfers Overall transfer level: Needs assistance Equipment used: Rolling walker (2 wheeled);1 person hand held assist Transfers: Sit to/from Stand Sit to Stand: Mod assist            Ambulation/Gait Ambulation/Gait assistance: Min assist;Mod assist Ambulation Distance (Feet): 4 Feet Assistive device: Rolling walker (2 wheeled);1 person hand held assist Gait Pattern/deviations: Step-to pattern;Shuffle;Decreased stride length;Wide base of support;Trunk flexed Gait velocity: reduced Gait velocity interpretation: Below normal speed for  age/gender General Gait Details: limited wide based gait   Stairs            Wheelchair Mobility    Modified Rankin (Stroke Patients Only)       Balance Overall balance assessment: Needs assistance Sitting-balance support: Feet supported;Bilateral upper extremity supported Sitting balance-Leahy Scale: Fair     Standing balance support: Bilateral upper extremity supported;During functional activity Standing balance-Leahy Scale: Poor                               Pertinent Vitals/Pain Pain Assessment: 0-10 Pain Score: 10-Worst pain ever Pain Location: L thigh wiht mobility Pain Descriptors / Indicators: Burning;Sharp(stinging) Pain Intervention(s): Limited activity within patient's tolerance;Monitored during session;Premedicated before session;Repositioned;RN gave pain meds during session    [place] expects to be discharged to:: Private residence Living Arrangements: Children Available Help at Discharge: Family;Available PRN/intermittently Type of Home: House Home Access: Stairs to enter Entrance Stairs-Rails: Right;Left;Can reach both Entrance Stairs-Number of Steps: 4 Home Layout: One level Home Equipment: Walker - 2 wheels;Cane - single point Additional Comments: has been independent at home, unable to be out of house recently but did not use AD    Prior Function Level of Independence: Independent               Hand Dominance   Dominant Hand: Right    Extremity/Trunk Assessment   Upper Extremity Assessment Upper Extremity Assessment: Overall WFL for tasks assessed    Lower Extremity Assessment Lower Extremity Assessment:  Overall WFL for tasks assessed;LLE deficits/detail LLE Deficits / Details: pain limits wb on LLE LLE: Unable to fully assess due to pain LLE Coordination: decreased fine motor;decreased gross motor    Cervical / Trunk Assessment Cervical / Trunk Assessment: Normal  Communication    Communication: No difficulties  Cognition Arousal/Alertness: Awake/alert Behavior During Therapy: WFL for tasks assessed/performed Overall Cognitive Status: Within Functional Limits for tasks assessed                                        General Comments General comments (skin integrity, edema, etc.): wound vac is clean and draining wiht mobility    Exercises     Assessment/Plan    PT Assessment Patient needs continued PT services  PT Problem List Decreased strength;Decreased range of motion;Decreased activity tolerance;Decreased balance;Decreased mobility;Decreased coordination;Decreased knowledge of use of DME;Decreased safety awareness;Obesity;Decreased skin integrity;Pain       PT Treatment Interventions DME instruction;Gait training;Stair training;Functional mobility training;Therapeutic activities;Therapeutic exercise;Balance training;Neuromuscular re-education;Patient/family education    PT Goals (Current goals can be found in the Care Plan section)  Acute Rehab PT Goals Patient Stated Goal: to get pain managed PT Goal Formulation: With patient Time For Goal Achievement: 08/28/17 Potential to Achieve Goals: Good    Frequency Min 2X/week   Barriers to discharge Inaccessible home environment;Decreased caregiver support home alone with stairs to enter    Co-evaluation               AM-PAC PT "6 Clicks" Daily Activity  Outcome Measure Difficulty turning over in bed (including adjusting bedclothes, sheets and blankets)?: Unable Difficulty moving from lying on back to sitting on the side of the bed? : Unable Difficulty sitting down on and standing up from a chair with arms (e.g., wheelchair, bedside commode, etc,.)?: Unable Help needed moving to and from a bed to chair (including a wheelchair)?: A Lot Help needed walking in hospital room?: A Lot Help needed climbing 3-5 steps with a railing? : Total 6 Click Score: 8    End of Session Equipment  Utilized During Treatment: Gait belt Activity Tolerance: Patient limited by fatigue;Patient limited by pain Patient left: in bed;with call bell/phone within reach;with bed alarm set;with family/visitor present Nurse Communication: Mobility status PT Visit Diagnosis: Unsteadiness on feet (R26.81);Pain;Difficulty in walking, not elsewhere classified (R26.2);Muscle weakness (generalized) (M62.81) Pain - Right/Left: Left Pain - part of body: Leg    Time: 2330-0762 PT Time Calculation (min) (ACUTE ONLY): 40 min   Charges:   PT Evaluation $PT Eval Moderate Complexity: 1 Mod PT Treatments $Gait Training: 8-22 mins $Therapeutic Activity: 8-22 mins   PT G Codes:   PT G-Codes **NOT FOR INPATIENT CLASS** Functional Assessment Tool Used: AM-PAC 6 Clicks Basic Mobility  Ramond Dial 08/14/2017, 5:39 PM   Mee Hives, PT MS Acute Rehab Dept. Number: [place] [facility] [place]

## 2017-08-14 NOTE — Progress Notes (Signed)
Very drowsy post op but able to eat, drink and aroused-but goes right back to sleep. VSS post op.  Ate all her lunch.

## 2017-08-14 NOTE — Anesthesia Post-op Follow-up Note (Signed)
Anesthesia QCDR form completed.        

## 2017-08-14 NOTE — Progress Notes (Signed)
Pharmacy Antibiotic Note  [name] is a 62 y.o. female admitted on 08/11/2017 with wound infection.  Pharmacy has been consulted for zosyn and vancomycin dosing.  Plan: Renal function declined slightly over night. Due to poor renal function and pt size she is at risk for accumulation and toxicity. I will decrease pt dose to vancomycin 1500mg  q 24 hours Vancomycin trough 2/2@1530  Zosyn 3.375g q 8 hr EI infusion   Vancomycin trough 31. 2/2@1630   Changing dose to vancomycin 1gm iv q24h starting tonight at 2300,  Will recheck before 3rd dose.    Height: 5\' 8"  (172.7 cm) Weight: (!) 309 lb 1.4 oz (140.2 kg) IBW/kg (Calculated) : 63.9  Temp (24hrs), Avg:97.8 F (36.6 C), Min:97.5 F (36.4 C), Max:98 F (36.7 C)  Recent Labs  Lab 08/11/17 1435 08/12/17 0423 08/13/17 0312 08/14/17 0417 08/14/17 1551  WBC 5.7 5.6 6.8 6.1  --   CREATININE 1.95* 2.27* 2.62* 3.37*  --   VANCOTROUGH  --   --   --   --  31*    Estimated Creatinine Clearance: 26.8 mL/min (A) (by C-G formula based on SCr of 3.37 mg/dL (H)).    Allergies  Allergen Reactions  . Shellfish Allergy Anaphylaxis and Swelling  . Diazepam Other (See Comments)    "felt like out of body experience"  . Morphine And Related Itching    Antimicrobials this admission: Anti-infectives (From admission, onward)   Start     Dose/Rate Route Frequency Ordered Stop   08/14/17 1200  valACYclovir (VALTREX) tablet 500 mg     500 mg Oral Daily 08/14/17 1045     08/12/17 1600  vancomycin (VANCOCIN) 1,500 mg in sodium chloride 0.9 % 500 mL IVPB  Status:  Discontinued     1,500 mg 250 mL/hr over 120 Minutes Intravenous Every 24 hours 08/12/17 1305 08/14/17 1646   08/12/17 1202  piperacillin-tazobactam (ZOSYN) 3.375 GM/50ML IVPB    Comments:  Lyman Bishop   : cabinet override      08/12/17 1202 08/12/17 1224   08/11/17 1615  piperacillin-tazobactam (ZOSYN) IVPB 3.375 g     3.375 g 12.5 mL/hr over 240 Minutes Intravenous Every 8  hours 08/11/17 1602     08/11/17 1600  vancomycin (VANCOCIN) 2,000 mg in sodium chloride 0.9 % 500 mL IVPB  Status:  Discontinued     2,000 mg 250 mL/hr over 120 Minutes Intravenous Every 24 hours 08/11/17 1558 08/12/17 1305       Microbiology results: Recent Results (from the past 240 hour(s))  Surgical pcr screen     Status: None   Collection Time: 08/12/17  1:31 AM  Result Value Ref Range Status   MRSA, PCR NEGATIVE NEGATIVE Final   Staphylococcus aureus NEGATIVE NEGATIVE Final    Comment: (NOTE) The Xpert SA Assay (FDA approved for NASAL specimens in patients 66 years of age and older), is one component of a comprehensive surveillance program. It is not intended to diagnose infection nor to guide or monitor treatment. Performed at [facility], [address] [place], [place] [address]   Aerobic/Anaerobic Culture (surgical/deep wound)     Status: None (Preliminary result)   Collection Time: 08/12/17 12:34 PM  Result Value Ref Range Status   Specimen Description   Final    ABSCESS LEFT THIGH Performed at [facility], [place] [address], [place], [place] [address]    Special Requests   Final    NONE Performed at [facility], [address] [place], [place] [address]    Gram Stain   Final    FEW WBC PRESENT,BOTH PMN AND MONONUCLEAR FEW GRAM NEGATIVE RODS    Culture   Final    CULTURE REINCUBATED FOR BETTER GROWTH Performed at [facility], [place] [address] [place], [place] [address]    Report Status PENDING  Incomplete    Thank you for allowing pharmacy to be a part of this patient's care.  Thomasenia Sales, PharmD, MBA, [facility]     08/14/2017 4:47 PM

## 2017-08-14 NOTE — Transfer of Care (Signed)
Immediate Anesthesia Transfer of Care Note  Patient: [name]  Procedure(s) Performed: APPLICATION OF WOUND VAC Exchange (Left Leg Upper)  Patient Location: PACU  Anesthesia Type:General  Level of Consciousness: sedated  Airway & Oxygen Therapy: Patient Spontanous Breathing and Patient connected to nasal cannula oxygen  Post-op Assessment: Report given to RN and Post -op Vital signs reviewed and stable  Post vital signs: Reviewed and stable  Last Vitals:  Vitals:   08/14/17 0524 08/14/17 0951  BP: (!) 126/53 123/65  Pulse: 69 76  Resp: 20 10  Temp: 36.7 C 36.7 C  SpO2: 98%     Last Pain:  Vitals:   08/14/17 0524  TempSrc: Oral  PainSc:       Patients Stated Pain Goal: 0 (53/91/22 5834)  Complications: No apparent anesthesia complications

## 2017-08-14 NOTE — Plan of Care (Signed)
Patient going for wound vac change in morning.  [name]

## 2017-08-14 NOTE — Progress Notes (Signed)
Patient for wound vac change today. Labs reviewed: HGB at 8.8, likely secondary to IV fluids. Creatinine continues to rise, now at 3.37, eGFR down to 16. Lytes OK BS 200-250. D/C plans based on renal function unless wound findings are unusual.

## 2017-08-14 NOTE — Anesthesia Postprocedure Evaluation (Signed)
Anesthesia Post Note  Patient: [name]  Procedure(s) Performed: APPLICATION OF WOUND VAC Exchange (Left Leg Upper)  Patient location during evaluation: PACU Anesthesia Type: General Level of consciousness: awake and alert Pain management: pain level controlled Vital Signs Assessment: post-procedure vital signs reviewed and stable Respiratory status: spontaneous breathing, nonlabored ventilation, respiratory function stable and patient connected to nasal cannula oxygen Cardiovascular status: blood pressure returned to baseline and stable Postop Assessment: no apparent nausea or vomiting Anesthetic complications: no     Last Vitals:  Vitals:   08/14/17 1022 08/14/17 1036  BP: 133/76 119/65  Pulse: 64 66  Resp: 12 12  Temp: 36.6 C   SpO2: 96% 98%    Last Pain:  Vitals:   08/14/17 1008  TempSrc:   PainSc: 7                  Martha Clan

## 2017-08-14 NOTE — Progress Notes (Signed)
[place] [facility] [place] NAME: [name]    MR#:  [id number]  DATE OF BIRTH:  1956/03/06  SUBJECTIVE:   Patient is status post incision and drainage of the left thigh abscess with wound VAC placement. Renal function has not improved. Patient still having some pain in the left thigh near his wound VAC.  She underwent dressing change in the OR this morning by Dr. Bary Castilla  REVIEW OF SYSTEMS:    Review of Systems  Constitutional: Negative for chills and fever.  HENT: Negative for congestion and tinnitus.   Eyes: Negative for blurred vision and double vision.  Respiratory: Negative for cough, shortness of breath and wheezing.   Cardiovascular: Negative for chest pain, orthopnea and PND.  Gastrointestinal: Negative for abdominal pain, diarrhea, nausea and vomiting.  Genitourinary: Negative for dysuria and hematuria.  Neurological: Negative for dizziness, sensory change and focal weakness.  All other systems reviewed and are negative.   Nutrition: Carb control Tolerating Diet: Yes Tolerating PT: Await Eval.   DRUG ALLERGIES:   Allergies  Allergen Reactions  . Shellfish Allergy Anaphylaxis and Swelling  . Diazepam Other (See Comments)    "felt like out of body experience"  . Morphine And Related Itching    VITALS:  Blood pressure (!) 157/95, pulse 72, temperature 97.8 F (36.6 C), temperature source Oral, resp. rate 20, height 5\' 8"  (1.727 m), weight (!) 140.2 kg (309 lb 1.4 oz), SpO2 94 %.  PHYSICAL EXAMINATION:   Physical Exam  GENERAL:  62 y.o.-year-old morbidly obese patient lying in bed in no acute distress.  EYES: Pupils equal, round, reactive to light and accommodation. No scleral icterus. Extraocular muscles intact.  HEENT: Head atraumatic, normocephalic. Oropharynx and nasopharynx clear.  NECK:  Supple, no jugular venous distention. No thyroid enlargement, no tenderness.  LUNGS: Normal breath sounds bilaterally, no  wheezing, rales, rhonchi. No use of accessory muscles of respiration.  CARDIOVASCULAR: S1, S2 normal. No murmurs, rubs, or gallops.  ABDOMEN: Soft, nontender, nondistended. Bowel sounds present. No organomegaly or mass.  EXTREMITIES: No cyanosis, clubbing or edema b/l.  Left Thigh wound vac in place + NEUROLOGIC: Cranial nerves II through XII are intact. No focal Motor or sensory deficits b/l.   PSYCHIATRIC: The patient is alert and oriented x 3.  SKIN: No obvious rash, lesion, or ulcer.    LABORATORY PANEL:   CBC Recent Labs  Lab 08/14/17 0417  WBC 6.1  HGB 8.8*  HCT 27.1*  PLT 225   ------------------------------------------------------------------------------------------------------------------  Chemistries  Recent Labs  Lab 08/11/17 1435  08/14/17 0417  NA 135   < > 134*  K 3.8   < > 4.8  CL 101   < > 106  CO2 22   < > 19*  GLUCOSE 422*   < > 249*  BUN 50*   < > 62*  CREATININE 1.95*   < > 3.37*  CALCIUM 8.5*   < > 7.6*  AST 49*  --   --   ALT 36  --   --   ALKPHOS 260*  --   --   BILITOT 0.5  --   --    < > = values in this interval not displayed.   ------------------------------------------------------------------------------------------------------------------  Cardiac Enzymes No results for input(s): TROPONINI in the last 168 hours. ------------------------------------------------------------------------------------------------------------------  RADIOLOGY:  No results found.   ASSESSMENT AND PLAN:   62 year old female with past medical history of hepatitis C, diabetes, chronic kidney disease stage  IV, history of coronary artery disease, history of chronic diastolic CHF who presented to the hospital directly from the surgeon's office due to a left thigh abscess.  1.left thigh abscess -status post incision and drainage and wound VAC placement by general surgery POD #1 -s/p  changing the wound VAC in the OR today - She'll likely to go home with the  wound VAC. -Continue empiric antibiotics with vancomycin, Zosyn. - will target abx once intraoperative cultures come back.   2. Acute on chronic kidney injury -patient's baseline creatinine is around 2.0, currently creatinine is 2.6. -Continue gentle IV fluids, follow BUN/creatinine. Renal dose meds, avoid nephrotoxins.  3. History of coronary artery disease- no acute chest pain. - cont. Coreg, Brillinta, Statin.   4. DM Type II w/out complication - continue Lantus, NovoLog with meals and sliding scale insulin coverage. - BS stable.   5. Hyperlipidemia-continue atorvastatin.  6. diabetic neuropathy-continue gabapentin.  7. Essential hypertension-continue carvedilol, Norvasc, hydralazine, Imdur  8. GERD - cont. Protonix.   PT to see pt  All the records are reviewed and case discussed with Care Management/Social Worker. Management plans discussed with the patient, family and they are in agreement.  CODE STATUS: Full code  DVT Prophylaxis: Hep SQ  TOTAL TIME TAKING CARE OF THIS PATIENT: 30 minutes.   POSSIBLE D/C IN 1-2 DAYS, DEPENDING ON CLINICAL CONDITION.   [name] M.D on 08/14/2017 at 12:27 PM  Between 7am to 6pm - Pager - [phone]  After 6pm go to [email or web address] - [employer] [facility]  Office  [phone]  CC: Primary care physician; [name], PA-C

## 2017-08-15 ENCOUNTER — Encounter: Payer: Self-pay | Admitting: General Surgery

## 2017-08-15 ENCOUNTER — Encounter
Admission: AD | Disposition: A | Discharge status: Home Health | Payer: Self-pay | Source: Ambulatory Visit | Attending: Internal Medicine

## 2017-08-15 ENCOUNTER — Inpatient Hospital Stay: Payer: Medicaid Other | Admitting: Anesthesiology

## 2017-08-15 ENCOUNTER — Inpatient Hospital Stay: Payer: Medicaid Other

## 2017-08-15 HISTORY — PX: DRESSING CHANGE UNDER ANESTHESIA: SHX5237

## 2017-08-15 LAB — HEMOGLOBIN: Hemoglobin: 8.4 g/dL — ABNORMAL LOW (ref 12.0–16.0)

## 2017-08-15 LAB — GLUCOSE, CAPILLARY
Glucose-Capillary: 270 mg/dL — ABNORMAL HIGH (ref 65–99)
Glucose-Capillary: 213 mg/dL — ABNORMAL HIGH (ref 65–99)
Glucose-Capillary: 175 mg/dL — ABNORMAL HIGH (ref 65–99)
Glucose-Capillary: 157 mg/dL — ABNORMAL HIGH (ref 65–99)
Glucose-Capillary: 202 mg/dL — ABNORMAL HIGH (ref 65–99)

## 2017-08-15 SURGERY — REPLACEMENT, DRESSING, WITH ANESTHESIA
Anesthesia: General | Laterality: Left | Wound class: Dirty or Infected

## 2017-08-15 MED ORDER — FENTANYL CITRATE (PF) 100 MCG/2ML IJ SOLN
25.0000 ug | INTRAMUSCULAR | Status: DC | PRN
  Administered 2017-08-15 (×4): 25 ug via INTRAVENOUS

## 2017-08-15 MED ORDER — PROPOFOL 10 MG/ML IV BOLUS
INTRAVENOUS | Status: AC
Start: 2017-08-15 — End: ?
  Filled 2017-08-15: qty 20

## 2017-08-15 MED ORDER — SODIUM CHLORIDE 0.9 % IV SOLN
INTRAVENOUS | Status: DC | PRN
  Administered 2017-08-15: 18:00:00 via INTRAVENOUS

## 2017-08-15 MED ORDER — SUCCINYLCHOLINE CHLORIDE 20 MG/ML IJ SOLN
INTRAMUSCULAR | Status: DC | PRN
  Administered 2017-08-15: 140 mg via INTRAVENOUS

## 2017-08-15 MED ORDER — ONDANSETRON HCL 4 MG/2ML IJ SOLN: 4.0000 mg | Freq: Once | INTRAMUSCULAR | Status: DC | PRN

## 2017-08-15 MED ORDER — INSULIN ASPART 100 UNIT/ML ~~LOC~~ SOLN
12.0000 [IU] | Freq: Three times a day (TID) | SUBCUTANEOUS | Status: DC
  Administered 2017-08-15 – 2017-08-18 (×8): 12 [IU] via SUBCUTANEOUS
  Filled 2017-08-15 (×7): qty 1

## 2017-08-15 MED ORDER — DEXAMETHASONE SODIUM PHOSPHATE 10 MG/ML IJ SOLN
INTRAMUSCULAR | Status: DC | PRN
  Administered 2017-08-15: 5 mg via INTRAVENOUS

## 2017-08-15 MED ORDER — PHENYLEPHRINE HCL 10 MG/ML IJ SOLN
INTRAMUSCULAR | Status: DC | PRN
  Administered 2017-08-15: 50 ug via INTRAVENOUS
  Administered 2017-08-15: 100 ug via INTRAVENOUS
  Administered 2017-08-15: 150 ug via INTRAVENOUS
  Administered 2017-08-15: 50 ug via INTRAVENOUS
  Administered 2017-08-15: 100 ug via INTRAVENOUS

## 2017-08-15 MED ORDER — SUCCINYLCHOLINE CHLORIDE 20 MG/ML IJ SOLN
INTRAMUSCULAR | Status: AC
Start: 2017-08-15 — End: ?
  Filled 2017-08-15: qty 1

## 2017-08-15 MED ORDER — FENTANYL CITRATE (PF) 100 MCG/2ML IJ SOLN
INTRAMUSCULAR | Status: AC
  Filled 2017-08-15: qty 2

## 2017-08-15 MED ORDER — GLYCOPYRROLATE 0.2 MG/ML IJ SOLN
INTRAMUSCULAR | Status: DC | PRN
  Administered 2017-08-15: 0.2 mg via INTRAVENOUS

## 2017-08-15 MED ORDER — DEXAMETHASONE SODIUM PHOSPHATE 10 MG/ML IJ SOLN
INTRAMUSCULAR | Status: AC
  Filled 2017-08-15: qty 1

## 2017-08-15 MED ORDER — EPHEDRINE SULFATE 50 MG/ML IJ SOLN
INTRAMUSCULAR | Status: DC | PRN
  Administered 2017-08-15: 10 mg via INTRAVENOUS

## 2017-08-15 MED ORDER — ROCURONIUM BROMIDE 50 MG/5ML IV SOLN
INTRAVENOUS | Status: AC
  Filled 2017-08-15: qty 1

## 2017-08-15 MED ORDER — FENTANYL CITRATE (PF) 100 MCG/2ML IJ SOLN
INTRAMUSCULAR | Status: AC
  Administered 2017-08-15: 25 ug via INTRAVENOUS
  Filled 2017-08-15: qty 2

## 2017-08-15 MED ORDER — FUROSEMIDE 10 MG/ML IJ SOLN
80.0000 mg | Freq: Once | INTRAMUSCULAR | Status: AC
  Administered 2017-08-15: 80 mg via INTRAVENOUS
  Filled 2017-08-15: qty 8

## 2017-08-15 MED ORDER — ONDANSETRON HCL 4 MG/2ML IJ SOLN
INTRAMUSCULAR | Status: AC
  Filled 2017-08-15: qty 2

## 2017-08-15 MED ORDER — MIDAZOLAM HCL 2 MG/2ML IJ SOLN
INTRAMUSCULAR | Status: AC
  Filled 2017-08-15: qty 2

## 2017-08-15 MED ORDER — PROPOFOL 10 MG/ML IV BOLUS
INTRAVENOUS | Status: DC | PRN
  Administered 2017-08-15: 110 mg via INTRAVENOUS

## 2017-08-15 MED ORDER — MIDAZOLAM HCL 5 MG/5ML IJ SOLN
INTRAMUSCULAR | Status: DC | PRN
  Administered 2017-08-15: 2 mg via INTRAVENOUS

## 2017-08-15 MED ORDER — LIDOCAINE HCL (CARDIAC) 20 MG/ML IV SOLN
INTRAVENOUS | Status: DC | PRN
  Administered 2017-08-15: 60 mg via INTRAVENOUS

## 2017-08-15 MED ORDER — FENTANYL CITRATE (PF) 100 MCG/2ML IJ SOLN
INTRAMUSCULAR | Status: DC | PRN
  Administered 2017-08-15: 50 ug via INTRAVENOUS

## 2017-08-15 MED ORDER — ONDANSETRON HCL 4 MG/2ML IJ SOLN
INTRAMUSCULAR | Status: DC | PRN
  Administered 2017-08-15: 4 mg via INTRAVENOUS

## 2017-08-15 MED ORDER — FUROSEMIDE 10 MG/ML IJ SOLN
40.0000 mg | Freq: Every day | INTRAMUSCULAR | Status: DC
  Filled 2017-08-15: qty 4

## 2017-08-15 SURGICAL SUPPLY — 36 items
BANDAGE ELASTIC 6 LF NS (GAUZE/BANDAGES/DRESSINGS) ×1 IMPLANT
BNDG CMPR MED 5X6 ELC HKLP NS (GAUZE/BANDAGES/DRESSINGS)
BNDG GAUZE 4.5X4.1 6PLY STRL (MISCELLANEOUS) IMPLANT
CANISTER SUCT 1200ML W/VALVE (MISCELLANEOUS) ×2 IMPLANT
CHLORAPREP W/TINT 26ML (MISCELLANEOUS) ×1 IMPLANT
DRAPE LAPAROTOMY 100X77 ABD (DRAPES) ×2 IMPLANT
DRSG EMULSION OIL 3X8 NADH (GAUZE/BANDAGES/DRESSINGS) ×2 IMPLANT
DRSG TELFA 4X3 1S NADH ST (GAUZE/BANDAGES/DRESSINGS) ×1 IMPLANT
ELECT CAUTERY BLADE TIP 2.5 (TIP) ×2
ELECT REM PT RETURN 9FT ADLT (ELECTROSURGICAL) ×2
ELECTRODE CAUTERY BLDE TIP 2.5 (TIP) ×1 IMPLANT
ELECTRODE REM PT RTRN 9FT ADLT (ELECTROSURGICAL) ×1 IMPLANT
GLOVE BIO SURGEON STRL SZ7.5 (GLOVE) ×4 IMPLANT
GLOVE INDICATOR 8.0 STRL GRN (GLOVE) ×2 IMPLANT
GOWN STRL REUS W/ TWL LRG LVL3 (GOWN DISPOSABLE) ×2 IMPLANT
GOWN STRL REUS W/TWL LRG LVL3 (GOWN DISPOSABLE) ×4
KIT TURNOVER KIT A (KITS) ×2 IMPLANT
LABEL OR SOLS (LABEL) ×1 IMPLANT
NDL HYPO 25X1 1.5 SAFETY (NEEDLE) ×1 IMPLANT
NEEDLE HYPO 22GX1.5 SAFETY (NEEDLE) ×1 IMPLANT
NEEDLE HYPO 25X1 1.5 SAFETY (NEEDLE) IMPLANT
NS IRRIG 1000ML POUR BTL (IV SOLUTION) ×2 IMPLANT
OINTMENT BETADINE 1.5GM (MISCELLANEOUS) IMPLANT
PACK BASIN MINOR ARMC (MISCELLANEOUS) ×2 IMPLANT
PAD NEG PRESSURE SENSATRAC (MISCELLANEOUS) ×2 IMPLANT
STRIP CLOSURE SKIN 1/2X4 (GAUZE/BANDAGES/DRESSINGS) ×1 IMPLANT
SUT MON AB 2-0 CT1 36 (SUTURE) ×4 IMPLANT
SUT VIC AB 2-0 CT1 27 (SUTURE)
SUT VIC AB 2-0 CT1 TAPERPNT 27 (SUTURE) ×1 IMPLANT
SUT VIC AB 2-0 CT2 27 (SUTURE) ×2 IMPLANT
SUT VIC AB 4-0 FS2 27 (SUTURE) ×1 IMPLANT
SUT VICRYL/POLYSORB 3.0 (SUTURE) ×1 IMPLANT
SWAB CULTURE AMIES ANAERIB BLU (MISCELLANEOUS) IMPLANT
SWABSTK COMLB BENZOIN TINCTURE (MISCELLANEOUS) ×1 IMPLANT
SYR CONTROL 10ML (SYRINGE) ×2 IMPLANT
WND VAC CANISTER 500ML (MISCELLANEOUS) ×1 IMPLANT

## 2017-08-15 NOTE — Clinical Social Work Note (Signed)
Clinical Social Work Assessment  Patient Details  Name: [name] MRN: [id number] Date of Birth: [birth year]  Date of referral:  08/15/17               Reason for consult:  Facility Placement                Permission sought to share information with:  Facility Art therapist granted to share information::  Yes, Verbal Permission Granted  Name::        Agency::     Relationship::     Contact Information:     Housing/Transportation Living arrangements for the past 2 months:  Single Family Home Source of Information:  Patient, Medical Team Patient Interpreter Needed:  None Criminal Activity/Legal Involvement Pertinent to Current Situation/Hospitalization:    Significant Relationships:  Adult Children Lives with:  Adult Children Do you feel safe going back to the place where you live?  Yes Need for family participation in patient care:  No (Coment)  Care giving concerns:  PT recommendation for SNF   Social Worker assessment / plan:  CSW spoke with the patient about discharge planning and the PT recommendation. The patient consented to a SNF referral in [facility], and she indicated that she does not wish to be placed outside of this area. The CSW explained that her insurance may be a barrier (Medicaid). The patient indicated that she would still prefer only to have the referral in the [facility] area with expansion if no beds are available. At baseline, the patient lives with her daughter and son-in-law. She feels well cared for; however, she realizes that she needs a higher level of care at this time as they are at work, and she is alone most of the day.  The CSW has begun the referral process and will provide bed offers as available. CSW is following.  Employment status:  Retired Forensic scientist:  Medicaid In [place] PT Recommendations:  [place] / Referral to community resources:  [place]  Patient/Family's Response to care: The patient thanked the CSW.  Patient/Family's Understanding of and Emotional Response to Diagnosis, Current Treatment, and Prognosis: The patient understands and agrees with the discharge plan.  Emotional Assessment Appearance:  Appears stated age Attitude/Demeanor/Rapport:  Lethargic Affect (typically observed):  Accepting, Appropriate, Quiet Orientation:  Oriented to Self, Oriented to Place, Oriented to  Time, Oriented to Situation Alcohol / Substance use:  Never Used Psych involvement (Current and /or in the community):  No (Comment)  Discharge Needs  Concerns to be addressed:  Care Coordination, Discharge Planning Concerns Readmission within the last 30 days:  Yes Current discharge risk:  Chronically ill Barriers to Discharge:  Continued Medical Work up   [employer], LCSW 08/15/2017, 4:37 PM

## 2017-08-15 NOTE — Progress Notes (Signed)
The patient had been receiving Toradol up until yesterday morning and had been on Lovenox, 40 mg twice daily up until this morning.  This may have contributed to bleeding noted today as no discernible vessels or arterial bleeders were identified on wound exploration.  Will hold reinstitution of anticoagulant therapy and make use of foot, calf compressive devices to minimize the risk of DVT (which the patient is at high risk of based on her morbid obesity and poor mobility).  Will reassess for possible very low dose heparin therapy tomorrow.

## 2017-08-15 NOTE — Op Note (Signed)
Preoperative diagnosis: Bleeding left thigh wound.  Postoperative diagnosis: Same.  Operative procedure: Evacuation of hematoma, wound irrigation, wound VAC application.  Operating Surgeon: Hervey Ard, MD.  Anesthesia: General endotracheal.  Estimated blood loss: 10 cc.  Clinical note: This patient underwent wound VAC dressing change yesterday and the wound was very clean without evidence of any necrotic tissue.  The wound VAC was reapplied without incident.  Over the last 18 hours the patient had significant bleeding from the dressing site and she is return to the operating room for exploration.  Operative note: The patient underwent general endotracheal anesthesia with rapid sequence based on prior meals.  This was tolerated well and executed with skill.  The leg was cleansed with ChloraPrep.  The clot was evacuated.  A few skin edge bleeders and fat bleeders were noted treated with cautery.  The wound was explored to its depth with no vessels identified.  The patient's blood pressure during the procedure was approximately 90 systolic, but again no discernible vessels were noted, only capillary bed bleeding.  2-0 Monocryl sutures were used to approximate the depths of the wound where it was very clean and there was no evidence of any residual inflammatory tissue.  Adaptic gauze was applied to the base of the wound followed by a black wound VAC sponge and the occlusive plastic dressing.  It was elected not to reinstitute VAC therapy at this time, this will be delayed 12 hours.  Patient tolerated the procedure well and was taken to the recovery room stable condition.

## 2017-08-15 NOTE — Anesthesia Procedure Notes (Signed)
Procedure Name: Intubation Date/Time: 08/15/2017 6:06 PM Performed by: Dionne Bucy, CRNA Pre-anesthesia Checklist: Patient identified, Patient being monitored, Timeout performed, Emergency Drugs available and Suction available Patient Re-evaluated:Patient Re-evaluated prior to induction Oxygen Delivery Method: Circle system utilized Preoxygenation: Pre-oxygenation with 100% oxygen Induction Type: IV induction, Rapid sequence and Cricoid Pressure applied Laryngoscope Size: 3 and McGraph Grade View: Grade I Tube type: Oral Tube size: 7.0 mm Number of attempts: 1 Airway Equipment and Method: Stylet and Video-laryngoscopy Placement Confirmation: ETT inserted through vocal cords under direct vision,  positive ETCO2 and breath sounds checked- equal and bilateral Secured at: 21 cm Tube secured with: Tape Dental Injury: Teeth and Oropharynx as per pre-operative assessment

## 2017-08-15 NOTE — Progress Notes (Signed)
AVSS. Reports some wheezing, awaiting breathing treatment. Reported mid back pain that started last night. Leg drainage has been significant since wound vac change yesterday, called re: clotted tubing. 1000 cc recorded. Leg: No erythema. Vac dressing removed, moderate clot noted. Wound remains clean. Wound edge bleeding noted. No blood welling from depth. Cleaned with NS and saline dressing applied. Will d/c wound vac for present. Discussed status as a Jehovah's Witness. Reports that she has accepted blood in the past ( November at [facility]) and would take blood if necessary. HGB down to 8.8 yesterday, with blood in wound vac it is likely lower today. Discussed with Dr. Posey Pronto.

## 2017-08-15 NOTE — Progress Notes (Signed)
[place] [facility] [place] NAME: [name]    MR#:  [id number]  DATE OF BIRTH:  02/08/1956  SUBJECTIVE:   Patient is status post incision and drainage of the left thigh abscess with wound VAC placement. Renal function has not improved.   Wound VAC removed by Dr. Tollie [name].  Wound oozing blood.  Patient has some shortness of breath.  REVIEW OF SYSTEMS:    Review of Systems  Constitutional: Negative for chills and fever.  HENT: Negative for congestion and tinnitus.   Eyes: Negative for blurred vision and double vision.  Respiratory: Positive for shortness of breath. Negative for cough and wheezing.   Cardiovascular: Negative for chest pain, orthopnea and PND.  Gastrointestinal: Negative for abdominal pain, diarrhea, nausea and vomiting.  Genitourinary: Negative for dysuria and hematuria.  Neurological: Positive for weakness. Negative for dizziness, sensory change and focal weakness.  All other systems reviewed and are negative.   Nutrition: Carb control Tolerating Diet: Yes Tolerating PT: Await Eval.   DRUG ALLERGIES:   Allergies  Allergen Reactions  . Shellfish Allergy Anaphylaxis and Swelling  . Diazepam Other (See Comments)    "felt like out of body experience"  . Morphine And Related Itching    VITALS:  Blood pressure (!) 165/73, pulse 81, temperature (!) 97.4 F (36.3 C), temperature source Oral, resp. rate 20, height 5\' 8"  (1.727 m), weight (!) 146.5 kg (322 lb 15.6 oz), SpO2 99 %.  PHYSICAL EXAMINATION:   Physical Exam  GENERAL:  62 y.o.-year-old morbidly obese patient lying in bed in no acute distress.  EYES: Pupils equal, round, reactive to light and accommodation. No scleral icterus. Extraocular muscles intact.  HEENT: Head atraumatic, normocephalic. Oropharynx and nasopharynx clear.  NECK:  Supple, no jugular venous distention. No thyroid enlargement, no tenderness.  LUNGS: Normal breath sounds bilaterally, no wheezing,  rales, rhonchi. No use of accessory muscles of respiration.  CARDIOVASCULAR: S1, S2 normal. No murmurs, rubs, or gallops.  ABDOMEN: Soft, nontender, nondistended. Bowel sounds present. No organomegaly or mass.  EXTREMITIES: No cyanosis, clubbing or edema b/l.  Left Thigh wound vac removed. NEUROLOGIC: Cranial nerves II through XII are intact. No focal Motor or sensory deficits b/l.   PSYCHIATRIC: The patient is alert and oriented x 3.  SKIN: No obvious rash, lesion, or ulcer.    LABORATORY PANEL:   CBC Recent Labs  Lab 08/14/17 0417  WBC 6.1  HGB 8.8*  HCT 27.1*  PLT 225   ------------------------------------------------------------------------------------------------------------------  Chemistries  Recent Labs  Lab 08/11/17 1435  08/14/17 0417  NA 135   < > 134*  K 3.8   < > 4.8  CL 101   < > 106  CO2 22   < > 19*  GLUCOSE 422*   < > 249*  BUN 50*   < > 62*  CREATININE 1.95*   < > 3.37*  CALCIUM 8.5*   < > 7.6*  AST 49*  --   --   ALT 36  --   --   ALKPHOS 260*  --   --   BILITOT 0.5  --   --    < > = values in this interval not displayed.   ------------------------------------------------------------------------------------------------------------------  Cardiac Enzymes No results for input(s): TROPONINI in the last 168 hours. ------------------------------------------------------------------------------------------------------------------  RADIOLOGY:  No results found.   ASSESSMENT AND PLAN:   62 year old female with past medical history of hepatitis C, diabetes, chronic kidney disease stage IV, history of  coronary artery disease, history of chronic diastolic CHF who presented to the hospital directly from the surgeon's office due to a left thigh abscess.  1.left thigh abscess -status post incision and drainage and wound VAC placement by general surgery POD #1 -s/p  changing the wound VAC in the OR on 08/14/17 -Wound VAC removed by Dr. Charissa [name] at bedside.   Wound looks clean and healthy.  \ -DC empiric vancomycin, Zosyn--Dr. Bary Castilla okay with -Check hemoglobin given blood clot and oozing through wound VAC  2. Acute on chronic kidney injury stage III/4 -patient's baseline creatinine is around 2.0, currently creatinine is 2.6.--3.37  -Continue gentle IV fluids, follow BUN/creatinine. Renal dose meds, avoid nephrotoxins. -Neurology consultation plan  3. History of coronary artery disease- no acute chest pain. - cont. Coreg, Brillinta, Statin.   4. DM Type II w/out complication  - continue Lantus, NovoLog with meals and sliding scale insulin coverage. - BS stable.   5. Hyperlipidemia-continue atorvastatin.  6. diabetic neuropathy-continue gabapentin.  7. Essential hypertension-continue carvedilol, Norvasc, hydralazine, Imdur  8. GERD - cont. Protonix.   PT to see pt  All the records are reviewed and case discussed with Care Management/Social Worker. Management plans discussed with the patient, family and they are in agreement.  CODE STATUS: Full code  DVT Prophylaxis: Hep SQ  TOTAL TIME TAKING CARE OF THIS PATIENT: 30 minutes.   POSSIBLE D/C IN 1-2 DAYS, DEPENDING ON CLINICAL CONDITION.   [name] M.D on 08/15/2017 at 11:13 AM  Between 7am to 6pm - Pager - [phone]  After 6pm go to [email or web address] - Technical brewer [facility]  Office  [phone]  CC: Primary care physician; Heather Daring, PA-C

## 2017-08-15 NOTE — Consult Note (Signed)
[facility]  CONSULT NOTE    Date: 08/15/2017                  Patient Name:  [name]  MRN: [id number]  DOB: 19-May-1956  Age / Sex: 62 y.o., female         PCP: [name], PA-C                 Service Requesting Consult: Dr. Bary Castilla                 Reason for Consult: Acute renal failure            History of Present Illness: [name] is a 62 y.o. black female with hypertension, coronary artery disease, congestive heart failure with diastolic dysfunction, diabetes mellitus type II, hepatitis C chronic, peptic ulcer disease, obstructive sleep apnea, who was admitted to [facility] on 08/11/2017 for Lt thigh abscess  Patient was taken from debridement of left thigh abscess on 1/31 by Dr. Bary Castilla. Was given IV ketorolac for two days and then taken back to OR for irrigation of wound on 2/1. Patient was also given empiric vancomycin with vanc trough of 31.   Patient started on IV fluids with creatinine continue to rise. Nephrology consulted.   Patient with poorly controlled diabetes mellitus. Has not seen ophtho but endorses blurry vision. Endorses peripheral neuropathy. Hemoglobin A1c 9.1%.   Denies use of nonsteroidal anti-inflammatory agents.   Patient follows with [facility], Dr. Arty Baumgartner for her chronic kidney disease. Baseline creatinine of 1.81, GFR of 35.    Medications: Outpatient medications: Medications Prior to Admission  Medication Sig Dispense Refill Last Dose  . allopurinol (ZYLOPRIM) 100 MG tablet Take 1 tablet (100 mg total) by mouth daily. 30 tablet 6 08/10/2017 at Unknown time  . amLODipine (NORVASC) 10 MG tablet Take 1 tablet (10 mg total) by mouth daily. 90 tablet 1 08/10/2017 at Unknown time  . aspirin EC 81 MG EC tablet Take 1 tablet (81 mg total) by mouth daily.   08/10/2017 at Unknown time  . atorvastatin (LIPITOR) 80 MG tablet Take 1 tablet (80 mg total) by mouth daily at 6 PM. 90 tablet 3 08/10/2017 at Unknown  time  . carvedilol (COREG) 25 MG tablet TAKE 1 TABLET BY MOUTH 2 TIMES DAILY WITH A MEAL. 180 tablet 3 08/10/2017 at Unknown time  . ferrous sulfate 325 (65 FE) MG tablet Take 1 tablet (325 mg total) by mouth 2 (two) times daily with a meal. 180 tablet 1 08/10/2017 at Unknown time  . furosemide (LASIX) 40 MG tablet Take 1 tablet (40 mg total) by mouth daily. 90 tablet 3 08/10/2017 at Unknown time  . gabapentin (NEURONTIN) 100 MG capsule TAKE 2 CAPSULES BY MOUTH 3 TIMES A DAY. (Patient taking differently: Take 200 mg by mouth 3 (three) times daily. ) 180 capsule 1 08/10/2017 at Unknown time  . hydrALAZINE (APRESOLINE) 50 MG tablet Take 1 tablet (50 mg total) by mouth 3 (three) times daily. 270 tablet 3 08/10/2017 at Unknown time  . hydrOXYzine (ATARAX/VISTARIL) 50 MG tablet Take 1 tablet (50 mg total) by mouth 3 (three) times daily as needed for itching. 90 tablet 1 08/10/2017 at Unknown time  . insulin aspart (NOVOLOG) 100 UNIT/ML FlexPen 1-12 units with meals as needed per sliding scale instructions (Patient taking differently: Inject 25 Units into the skin 3 (three) times daily with meals. ) 15 mL 11 08/10/2017 at Unknown time  .  Insulin Glargine (LANTUS SOLOSTAR) 100 UNIT/ML Solostar Pen Inject 40 Units into the skin 2 (two) times daily. 24 mL 2 08/10/2017 at Unknown time  . isosorbide mononitrate (IMDUR) 60 MG 24 hr tablet Take 1 tablet (60 mg total) by mouth daily. 30 tablet 6 08/10/2017 at Unknown time  . MITIGARE 0.6 MG CAPS Take 1 capsule by mouth daily. 90 capsule 0 08/10/2017 at Unknown time  . omeprazole (PRILOSEC) 20 MG capsule Take 1 capsule (20 mg total) by mouth daily. 90 capsule 1 08/10/2017 at Unknown time  . PEDIALYTE (PEDIALYTE) SOLN Take 240 mLs by mouth every 6 (six) hours.    Past Month at Unknown time  . pregabalin (LYRICA) 100 MG capsule Take 1 capsule (100 mg total) by mouth 3 (three) times daily. 90 capsule 1 08/10/2017 at Unknown time  . ticagrelor (BRILINTA) 90 MG TABS tablet Take 1  tablet (90 mg total) by mouth 2 (two) times daily. 180 tablet 2 08/10/2017 at Unknown time  . valACYclovir (VALTREX) 500 MG tablet Take 1 tablet (500 mg total) by mouth daily. 90 tablet 3 08/10/2017 at Unknown time  . acetaminophen-codeine (TYLENOL #3) 300-30 MG tablet Take 1 tablet by mouth every 12 (twelve) hours as needed for severe pain. For osteoarthritis (unable to take NSAIDs due to CKD) 30 tablet 1 prn at prn  . albuterol (PROVENTIL) (2.5 MG/3ML) 0.083% nebulizer solution Take 3 mLs (2.5 mg total) by nebulization every 6 (six) hours as needed for wheezing or shortness of breath. 150 mL 1 PRN at PRN  . albuterol (VENTOLIN HFA) 108 (90 Base) MCG/ACT inhaler Inhale 2 puffs into the lungs every 4 (four) hours as needed for wheezing or shortness of breath. 54 g 3 PRN at PRN  . Blood Glucose Monitoring Suppl (ACCU-CHEK AVIVA) device Use as instructed daily. 1 each 0 Taking  . docusate sodium (COLACE) 100 MG capsule Take 1 capsule (100 mg total) every 12 (twelve) hours by mouth. (Patient not taking: Reported on 08/11/2017) 30 capsule 0 Not Taking at Unknown time  . doxycycline (VIBRA-TABS) 100 MG tablet Take 1 tablet (100 mg total) by mouth 2 (two) times daily. (Patient not taking: Reported on 08/05/2017) 20 tablet 0 Not Taking at Unknown time  . Exenatide ER (BYDUREON BCISE) 2 MG/0.85ML AUIJ Inject 2 mg into the skin once a week. (Patient not taking: Reported on 08/11/2017) 4 pen 5 Not Taking at Unknown time  . fluticasone (FLONASE) 50 MCG/ACT nasal spray Place 2 sprays into both nostrils daily as needed for allergies or rhinitis. 16 g 5 PRN at PRN  . Fluticasone-Salmeterol (ADVAIR DISKUS) 250-50 MCG/DOSE AEPB Inhale 1 puff into the lungs 2 (two) times daily. (Patient taking differently: Inhale 1 puff into the lungs 2 (two) times daily as needed (shortness of breath). ) 60 each 3 PRN at PRN  . glucose blood (ACCU-CHEK AVIVA) test strip Use as instructed daily 100 each 12 Taking  . glucose blood (TRUE METRIX  BLOOD GLUCOSE TEST) test strip Use as instructed 100 each 12 Taking  . HYDROcodone-acetaminophen (NORCO/VICODIN) 5-325 MG tablet Take 1 tablet every 4 (four) hours as needed by mouth for moderate pain. 15 tablet 0 PRN at PRN  . ketotifen (ZADITOR) 0.025 % ophthalmic solution Place 1 drop into both eyes daily as needed (for dry eyes). 10 mL 2 PRN at PRN  . Lancet Devices (ACCU-CHEK SOFTCLIX) lancets Use as instructed daily. 1 each 5 Taking  . levofloxacin (LEVAQUIN) 750 MG tablet Take 1 tablet (750 mg total)  by mouth every other day. (Patient not taking: Reported on 08/05/2017) 7 tablet 0 Not Taking at Unknown time  . nitroGLYCERIN (NITROSTAT) 0.4 MG SL tablet Place 1 tablet (0.4 mg total) under the tongue every 5 (five) minutes x 3 doses as needed for chest pain. 25 tablet 12 PRN at PRN  . oxyCODONE-acetaminophen (PERCOCET/ROXICET) 5-325 MG tablet Take 1 tablet by mouth every 6 (six) hours as needed for severe pain. 6 tablet 0 PRN at PRN    Current medications: Current Facility-Administered Medications  Medication Dose Route Frequency Provider Last Rate Last Dose  . acetaminophen (TYLENOL) tablet 650 mg  650 mg Oral Q6H PRN Demetrios Loll, MD       Or  . acetaminophen (TYLENOL) suppository 650 mg  650 mg Rectal Q6H PRN Demetrios Loll, MD      . albuterol (PROVENTIL) (2.5 MG/3ML) 0.083% nebulizer solution 2.5 mg  2.5 mg Nebulization Q2H PRN Demetrios Loll, MD   2.5 mg at 08/15/17 1051  . allopurinol (ZYLOPRIM) tablet 100 mg  100 mg Oral Daily Demetrios Loll, MD   100 mg at 08/15/17 1048  . amLODipine (NORVASC) tablet 10 mg  10 mg Oral Daily Demetrios Loll, MD   10 mg at 08/15/17 1048  . atorvastatin (LIPITOR) tablet 80 mg  80 mg Oral q1800 Demetrios Loll, MD   80 mg at 08/14/17 1713  . bisacodyl (DULCOLAX) EC tablet 5 mg  5 mg Oral Daily PRN Demetrios Loll, MD      . carvedilol (COREG) tablet 25 mg  25 mg Oral BID WC Demetrios Loll, MD   25 mg at 08/15/17 1048  . colchicine tablet 0.6 mg  0.6 mg Oral Daily Robert Bellow, MD    0.6 mg at 08/15/17 1047  . docusate sodium (COLACE) capsule 100 mg  100 mg Oral Q12H Demetrios Loll, MD   100 mg at 08/15/17 0131  . enoxaparin (LOVENOX) injection 40 mg  40 mg Subcutaneous Q12H Robert Bellow, MD   40 mg at 08/15/17 1047  . ferrous sulfate tablet 325 mg  325 mg Oral BID WC Demetrios Loll, MD   325 mg at 08/15/17 1047  . fluticasone (FLONASE) 50 MCG/ACT nasal spray 2 spray  2 spray Each Nare Daily PRN Demetrios Loll, MD      . Derrill Memo ON 08/16/2017] furosemide (LASIX) injection 40 mg  40 mg Intravenous Daily Carrina Schoenberger, MD      . furosemide (LASIX) injection 80 mg  80 mg Intravenous Once Coyt Govoni, MD      . gabapentin (NEURONTIN) capsule 200 mg  200 mg Oral TID Demetrios Loll, MD   200 mg at 08/15/17 1047  . hydrALAZINE (APRESOLINE) injection 10 mg  10 mg Intravenous Q6H PRN Demetrios Loll, MD      . hydrALAZINE (APRESOLINE) tablet 50 mg  50 mg Oral TID Demetrios Loll, MD   50 mg at 08/15/17 1047  . hydrOXYzine (ATARAX/VISTARIL) tablet 50 mg  50 mg Oral TID PRN Demetrios Loll, MD      . insulin aspart (novoLOG) injection 0-15 Units  0-15 Units Subcutaneous TID [facility] Demetrios Loll, MD   8 Units at 08/15/17 0830  . insulin aspart (novoLOG) injection 0-5 Units  0-5 Units Subcutaneous QHS Demetrios Loll, MD   2 Units at 08/14/17 2248  . insulin aspart (novoLOG) injection 12 Units  12 Units Subcutaneous TID WC [name], MD      . insulin glargine (LANTUS) injection 45 Units  45 Units Subcutaneous  BID [name], MD   45 Units at 08/15/17 1056  . isosorbide mononitrate (IMDUR) 24 hr tablet 60 mg  60 mg Oral Daily Demetrios Loll, MD   60 mg at 08/15/17 1048  . ketotifen (ZADITOR) 0.025 % ophthalmic solution 1 drop  1 drop Both Eyes Daily PRN Demetrios Loll, MD      . mometasone-formoterol [facility]) 200-5 MCG/ACT inhaler 2 puff  2 puff Inhalation BID Demetrios Loll, MD   2 puff at 08/15/17 1046  . morphine 2 MG/ML injection 2 mg  2 mg Intravenous Q2H PRN Robert Bellow, MD   2 mg at 08/15/17 0125  . nitroGLYCERIN  (NITROSTAT) SL tablet 0.4 mg  0.4 mg Sublingual Q5 Min x 3 PRN Demetrios Loll, MD      . ondansetron [facility]) tablet 4 mg  4 mg Oral Q6H PRN Demetrios Loll, MD       Or  . ondansetron [facility]) injection 4 mg  4 mg Intravenous Q6H PRN Demetrios Loll, MD      . oxyCODONE (Oxy IR/ROXICODONE) immediate release tablet 5 mg  5 mg Oral Q4H PRN Robert Bellow, MD   5 mg at 08/15/17 0931  . pantoprazole (PROTONIX) EC tablet 40 mg  40 mg Oral Daily Demetrios Loll, MD   40 mg at 08/15/17 1048  . PEDIALYTE solution SOLN 240 mL  240 mL Oral Q6H Byrnett, Forest Gleason, MD      . pregabalin (LYRICA) capsule 100 mg  100 mg Oral TID Demetrios Loll, MD   100 mg at 08/15/17 1047  . senna-docusate (Senokot-S) tablet 1 tablet  1 tablet Oral QHS PRN Demetrios Loll, MD      . sodium chloride flush (NS) 0.9 % injection 3 mL  3 mL Intravenous Q12H Demetrios Loll, MD   3 mL at 08/15/17 1051  . sodium chloride flush (NS) 0.9 % injection 3 mL  3 mL Intravenous PRN Demetrios Loll, MD      . ticagrelor [facility]) tablet 90 mg  90 mg Oral BID Demetrios Loll, MD   90 mg at 08/15/17 1050  . valACYclovir (VALTREX) tablet 500 mg  500 mg Oral Daily Robert Bellow, MD   500 mg at 08/15/17 1050      Allergies: Allergies  Allergen Reactions  . Shellfish Allergy Anaphylaxis and Swelling  . Diazepam Other (See Comments)    "felt like out of body experience"  . Morphine And Related Itching      Past Medical History: Past Medical History:  Diagnosis Date  . Aortic stenosis    Echo 8/18: mean 13, peak 28, LVOT/AV mean velocity 0.51  . Asthma    As a child   . Bronchitis   . CAD (coronary artery disease)    a. 09/2016: 50% Ost 1st Mrg stenosis, 50% 2nd Mrg stenosis, 20% Mid-Cx, 95% Prox LAD, 40% mid-LAD, and 10% dist-LAD stenosis. Staged PCI with DES to Prox-LAD.   [name] Chronic combined systolic and diastolic CHF (congestive heart failure) ([place]) 2011   echo 2/18: EF 55-60, normal wall motion, grade 2 diastolic dysfunction, trivial AI // echo 3/18: Septal and  apical HK, EF 45-50, normal wall motion, trivial AI, mild LAE, PASP 38 // echo 8/18: EF 60-65, normal wall motion, grade 1 diastolic dysfunction, calcified aortic valve leaflets, mild aortic stenosis (mean 13, peak 28, LVOT/AV mean velocity 0.51), mild AI, moderate MAC, mild LAE, trivial TR   . Complication of anesthesia   . Diabetes mellitus Dx 1989  .  Hepatitis C Dx 2013  . Hypertension Dx 1989  . Obesity   . Pancreatitis 2013  . Refusal of blood transfusions as patient is Jehovah's Witness   . Tendinitis   . Ulcer 2010     Past Surgical History: Past Surgical History:  Procedure Laterality Date  . APPLICATION OF WOUND VAC Left 08/14/2017   Procedure: APPLICATION OF WOUND VAC Exchange;  Surgeon: Robert Bellow, MD;  Location: [facility] ORS;  Service: General;  Laterality: Left;  . CHOLECYSTECTOMY    . CORONARY STENT INTERVENTION N/A 09/18/2016   Procedure: Coronary Stent Intervention;  Surgeon: Troy Sine, MD;  Location: [place] [facility];  Service: Cardiovascular;  Laterality: N/A;  . EYE SURGERY    . INCISION AND DRAINAGE ABSCESS Left 08/12/2017   Procedure: INCISION AND DRAINAGE ABSCESS;  Surgeon: Robert Bellow, MD;  Location: [facility] ORS;  Service: General;  Laterality: Left;  . KNEE ARTHROSCOPY    . LEFT HEART CATH N/A 09/18/2016   Procedure: Left Heart Cath;  Surgeon: Troy Sine, MD;  Location: [place] [facility];  Service: Cardiovascular;  Laterality: N/A;  . LEFT HEART CATH AND CORONARY ANGIOGRAPHY N/A 09/16/2016   Procedure: Left Heart Cath and Coronary Angiography;  Surgeon: Burnell Blanks, MD;  Location: [place] [facility];  Service: Cardiovascular;  Laterality: N/A;  . LEFT HEART CATH AND CORONARY ANGIOGRAPHY N/A 04/29/2017   Procedure: LEFT HEART CATH AND CORONARY ANGIOGRAPHY;  Surgeon: Nelva Bush, MD;  Location: [place] [facility];  Service: Cardiovascular;  Laterality: N/A;  . TUBAL LIGATION    . TUBAL LIGATION       Family History: Family History   Problem Relation Age of Onset  . Colon cancer Mother   . Heart attack Other   . Heart attack Maternal Grandmother   . Hypertension Sister   . Hypertension Brother   . Diabetes Paternal Grandmother   . Breast cancer Neg Hx      Social History: Social History   Socioeconomic History  . Marital status: Divorced    Spouse name: Not on file  . Number of children: Not on file  . Years of education: Not on file  . Highest education level: Not on file  Social Needs  . Financial resource strain: Not on file  . Food insecurity - worry: Not on file  . Food insecurity - inability: Not on file  . Transportation needs - medical: Not on file  . Transportation needs - non-medical: Not on file  Occupational History  . Not on file  Tobacco Use  . Smoking status: Former Smoker    Last attempt to quit: 10/25/1980    Years since quitting: 36.8  . Smokeless tobacco: Never Used  . Tobacco comment: quit smoking in 1982  Substance and Sexual Activity  . Alcohol use: Yes    Comment: 3 times in last year  . 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 Sexual activity: Not on file    Comment: Not asked  Other Topics Concern  . Not on file  Social History Narrative  . Not on file     Review of Systems: Review of Systems  Constitutional: Negative.  Negative for chills, diaphoresis, fever, malaise/fatigue and weight loss.  HENT: Negative.  Negative for congestion, ear discharge, ear pain, hearing loss, nosebleeds, sinus pain, sore throat and tinnitus.   Eyes: Negative.  Negative for blurred vision, double vision, photophobia, pain, discharge and redness.  Respiratory: Positive for shortness of breath. Negative for cough, hemoptysis, sputum production, wheezing and stridor.   Cardiovascular: Positive for leg swelling and PND. Negative for chest pain, palpitations, orthopnea and claudication.  Gastrointestinal: Negative for abdominal pain, blood in stool, constipation, diarrhea,  heartburn, melena, nausea and vomiting.  Genitourinary: Negative.  Negative for dysuria, flank pain, frequency, hematuria and urgency.  Musculoskeletal: Negative.  Negative for back pain, falls, joint pain, myalgias and neck pain.  Skin: Positive for itching. Negative for rash.       Left thigh wound with bleeding and itching  Neurological: Negative.  Negative for dizziness, tingling, tremors, sensory change, speech change, focal weakness, seizures, loss of consciousness, weakness and headaches.  Endo/Heme/Allergies: Negative.  Negative for environmental allergies and polydipsia. Does not bruise/bleed easily.  Psychiatric/Behavioral: Negative.  Negative for depression, hallucinations, memory loss, substance abuse and suicidal ideas. The patient is not nervous/anxious and does not have insomnia.     Vital Signs: Blood pressure (!) 165/73, pulse 81, temperature (!) 97.4 F (36.3 C), temperature source Oral, resp. rate 20, height _0  (1.727 m), weight (!) 146.5 kg (322 lb 15.6 oz), SpO2 99 %.  Weight trends: Filed Weights   08/12/17 1148 08/14/17 0524 08/15/17 0452  Weight: 136.1 kg (300 lb) (!) 140.2 kg (309 lb 1.4 oz) (!) 146.5 kg (322 lb 15.6 oz)    Physical Exam: General: NAD, sitting up in bed  Head: Normocephalic, atraumatic. Moist oral mucosal membranes  Eyes: Anicteric, PERRL  Neck: Supple, trachea midline  Lungs:  Basilar crackles bilaterally  Heart: Regular rate and rhythm  Abdomen:  Soft, nontender, obese  Extremities: 2+ peripheral edema.  Neurologic: Nonfocal, moving all four extremities  Skin: Left thigh wound, bloody dressings         Lab results: Basic Metabolic Panel: Recent Labs  Lab 08/12/17 0423 08/13/17 0312 08/14/17 0417  NA 134* 135 134*  K 3.8 4.2 4.8  CL 103 105 106  CO2 21* 19* 19*  GLUCOSE 347* 216* 249*  BUN 56* 56* 62*  CREATININE 2.27* 2.62* 3.37*  CALCIUM 8.0* 8.1* 7.6*    Liver Function Tests: Recent Labs  Lab 08/11/17 1435  AST  49*  ALT 36  ALKPHOS 260*  BILITOT 0.5  PROT 7.3  ALBUMIN 3.4*   No results for input(s): LIPASE, AMYLASE in the last 168 hours. No results for input(s): AMMONIA in the last 168 hours.  CBC: Recent Labs  Lab 08/11/17 1435 08/12/17 0423 08/13/17 0312 08/14/17 0417 08/15/17 1043  WBC 5.7 5.6 6.8 6.1  --   NEUTROABS 3.7  --   --   --   --   HGB 11.0* 10.2* 9.4* 8.8* 8.4*  HCT 34.1* 30.6* 28.8* 27.1*  --   MCV 84.3 84.6 84.4 84.4  --   PLT 238 224 213 225  --     Cardiac Enzymes: No results for input(s): CKTOTAL, CKMB, CKMBINDEX, TROPONINI in the last 168 hours.  BNP: Invalid input(s): POCBNP  CBG: Recent Labs  Lab 08/14/17 1141 08/14/17 1641 08/14/17 2142 08/15/17 0820 08/15/17 1152  GLUCAP 230* 265* 243* 270* 213*    Microbiology: Results for orders placed or performed during the hospital encounter of 08/11/17  Surgical pcr screen     Status: None   Collection Time: 08/12/17  1:31 AM  Result Value Ref Range Status   MRSA, PCR NEGATIVE NEGATIVE Final   Staphylococcus aureus NEGATIVE NEGATIVE Final    Comment: (NOTE) The Xpert SA Assay (FDA approved for  NASAL specimens in patients 22 years of age and older), is one component of a comprehensive surveillance program. It is not intended to diagnose infection nor to guide or monitor treatment. Performed at [facility], [address] [place], [place] [address]   Aerobic/Anaerobic Culture (surgical/deep wound)     Status: None (Preliminary result)   Collection Time: 08/12/17 12:34 PM  Result Value Ref Range Status   Specimen Description   Final    ABSCESS LEFT THIGH Performed at [facility], [place] [address] [place], [place] [address]    Special Requests   Final    NONE Performed at [facility], [place], [place] [address]    Gram Stain   Final    FEW WBC PRESENT,BOTH PMN AND MONONUCLEAR FEW GRAM NEGATIVE RODS    Culture   Final    CULTURE REINCUBATED FOR  BETTER GROWTH Performed at [facility], [place] [address], [place], [place] [address]    Report Status PENDING  Incomplete    Coagulation Studies: No results for input(s): LABPROT, INR in the last 72 hours.  Urinalysis: No results for input(s): COLORURINE, LABSPEC, PHURINE, GLUCOSEU, HGBUR, BILIRUBINUR, KETONESUR, PROTEINUR, UROBILINOGEN, NITRITE, LEUKOCYTESUR in the last 72 hours.  Invalid input(s): APPERANCEUR    Imaging:  No results found.   Assessment & Plan: [name] is a 62 y.o. black female with chronic kidney disease stage III followed by Dr. Arty Baumgartner, [facility], hypertension, coronary artery disease, congestive heart failure with diastolic dysfunction, diabetes mellitus type II, hepatitis C chronic, peptic ulcer disease, obstructive sleep apnea, who was admitted to [facility] on 08/11/2017 for Lt thigh abscess  1. Acute Kidney Disease on chronic kidney disease stage III with proteinuria: baseline creatinine 1.81, GFR of 35 on 08/05/17.  Chronic kidney disease secondary to diabetic nephropathy. Not currently on and ACE-I/ARB. Followed by [facility].  Acute renal failure seems to be ATN multifactorial with ketorolac exposure, IV contrast on 1/30 with CT scan, and vancomycin.  - Discontinue IV fluids. Patient with volume overload - Start furosemide IV  2. Hypertension: with peripheral edema 2+ on examination. Concern for acute exacerbation of diastolic congestive heart failure - IV furosemide as above.  - Continue amlodipine, carvedilol, hydralazine, isosorbide mononitrate  3. Diabetes mellitus type II with chronic kidney disease: insulin dependent. Hemoglobin A1c 9.1% 08/11/2017.  - Requires strict control.  4. Left thigh abcess: holding empiric antibiotics - Check PTH, phosphorus   LOS: 4 Reanne Nellums 2/3/201912:05 PM

## 2017-08-15 NOTE — Progress Notes (Signed)
L thigh dressing (abd pad x3) soaked again and some drainage down inner leg to chux. There's new bruiding around the wound bed , charge nurse, Kenney Houseman assessed and will call Dr. Bary Castilla.

## 2017-08-15 NOTE — Progress Notes (Signed)
Patient's wound vac has been leaking blood around the edges of the transparent dressing. Dressing has been reinforced with Tegaderm. The canister was changed at 0518, it was full of bloody output. The patient had no output at the start of the shift. The wound vac is now showing a message that there is a blockage. There is bulging of blood at the woundbed. Dr. Bary Castilla has been notified of the message on the wound vac and the leaking and bulging of blood. He ordered to stop the wound vac and cover the wound with ABD dressing.  The doctor stated he will see the patient at the bedside. Will continue to monitor patient.

## 2017-08-15 NOTE — Progress Notes (Signed)
Continued bleeding reported in spite of discontinuation of Vac dressing. Will require exploration and hemostasis in the OR. Discussed with anesthesia. In light of active bleeding, early, rather than late, intervention is indicated.

## 2017-08-15 NOTE — Anesthesia Post-op Follow-up Note (Signed)
Anesthesia QCDR form completed.        

## 2017-08-15 NOTE — NC FL2 (Signed)
[place] LEVEL OF CARE SCREENING TOOL     IDENTIFICATION  Patient Name: [name] Birthdate: 1956/02/22 Sex: female Admission Date (Current Location): 08/11/2017  [place] and [place] Number:  [name] [id number] [place] and Address:  [facility], [address] [place], [place] [address]      Provider Number: [id number]  Attending Physician Name and Address:  [name], MD  Relative Name and Phone Number:  Mishael Krysiak (Daughter) [phone]    Current Level of Care: Hospital Recommended Level of Care: [place] Prior Approval Number:    Date Approved/Denied: 08/15/17 PASRR Number: [id number]  Discharge Plan: SNF    Current Diagnoses: Patient Active Problem List   Diagnosis Date Noted  . Abscess of left thigh 06/02/2017  . Acute metabolic encephalopathy 40/34/7425  . Chest pain 04/29/2017  . Chest pain with moderate risk of acute coronary syndrome 04/28/2017  . Acute on chronic combined systolic and diastolic CHF, NYHA class 4 ([place]) 03/19/2017  . Unstable angina ([place]) 03/17/2017  . Aortic stenosis   . Herpes simplex infection of genitourinary system 02/12/2017  . Chronic pain of right knee 02/12/2017  . Chronic gout due to renal impairment of multiple sites without tophus 02/12/2017  . Esophageal dysphagia 02/12/2017  . Osteoarthritis 01/22/2017  . Diabetic hyperosmolar non-ketotic state ([place]) 12/13/2016  . CAD (coronary artery disease) 12/13/2016  . Hyperlipidemia 12/13/2016  . Hyperphosphatemia 12/13/2016  . History of non-ST elevation myocardial infarction (NSTEMI) 09/13/2016  . Acute myopericarditis   . Injury of left hand 02/24/2016  . Asthma 11/29/2015  . Trigger middle finger 11/25/2015  . Depression 09/05/2015  . GERD (gastroesophageal reflux disease) 03/25/2015  . Precordial pain   . Environmental allergies 03/14/2015  . Hep C w/o coma, chronic ([place]) 01/31/2015  . Diabetic neuropathy  ([place]) 01/31/2015  . OSA (obstructive sleep apnea) 01/01/2015  . Poor dentition 11/13/2014  . Essential hypertension 10/08/2014  . Obesity 10/08/2014  . CKD (chronic kidney disease) stage 4, GFR 15-29 ml/min (HCC) 10/08/2014  . Diabetes mellitus type 2, uncontrolled, with complications ([place]) 95/63/8756    Class: Chronic  . Anemia in stage 3 chronic kidney disease ([place]) 03/25/2012  . Chronic combined systolic and diastolic CHF (congestive heart failure) ([place]) 07/13/2009    Orientation RESPIRATION BLADDER Height & Weight     Self, Time, Situation, Place  Normal Continent Weight: (!) 322 lb 15.6 oz (146.5 kg) Height:  5\' 8"  (172.7 cm)  BEHAVIORAL SYMPTOMS/MOOD NEUROLOGICAL BOWEL NUTRITION STATUS      Continent Diet(NPO to be advanced post-op)  AMBULATORY STATUS COMMUNICATION OF NEEDS Skin   Extensive Assist Verbally Surgical wounds                       Personal Care Assistance Level of Assistance  Bathing, Feeding, Dressing Bathing Assistance: Limited assistance Feeding assistance: Independent Dressing Assistance: Limited assistance     Functional Limitations Info             SPECIAL CARE FACTORS FREQUENCY  PT (By licensed PT)     PT Frequency: Up tp 5X per day/5/week              Contractures Contractures Info: Not present    Additional Factors Info  Code Status, Allergies Code Status Info: Full Allergies Info: Shellfish Allergy, Diazepam, Morphine And Related           Current Medications (08/15/2017):  This is the current hospital active medication list Current Facility-Administered  Medications  Medication Dose Route Frequency Provider Last Rate Last Dose  . acetaminophen (TYLENOL) tablet 650 mg  650 mg Oral Q6H PRN Demetrios Loll, MD       Or  . acetaminophen (TYLENOL) suppository 650 mg  650 mg Rectal Q6H PRN Demetrios Loll, MD      . albuterol (PROVENTIL) (2.5 MG/3ML) 0.083% nebulizer solution 2.5 mg  2.5 mg Nebulization Q2H PRN Demetrios Loll, MD   2.5 mg at  08/15/17 1051  . allopurinol (ZYLOPRIM) tablet 100 mg  100 mg Oral Daily Demetrios Loll, MD   100 mg at 08/15/17 1048  . amLODipine (NORVASC) tablet 10 mg  10 mg Oral Daily Demetrios Loll, MD   10 mg at 08/15/17 1048  . atorvastatin (LIPITOR) tablet 80 mg  80 mg Oral q1800 Demetrios Loll, MD   80 mg at 08/14/17 1713  . bisacodyl (DULCOLAX) EC tablet 5 mg  5 mg Oral Daily PRN Demetrios Loll, MD      . carvedilol (COREG) tablet 25 mg  25 mg Oral BID WC Demetrios Loll, MD   25 mg at 08/15/17 1644  . colchicine tablet 0.6 mg  0.6 mg Oral Daily Robert Bellow, MD   0.6 mg at 08/15/17 1047  . docusate sodium (COLACE) capsule 100 mg  100 mg Oral Q12H Demetrios Loll, MD   100 mg at 08/15/17 1345  . enoxaparin (LOVENOX) injection 40 mg  40 mg Subcutaneous Q12H Robert Bellow, MD   40 mg at 08/15/17 1047  . ferrous sulfate tablet 325 mg  325 mg Oral BID WC Demetrios Loll, MD   325 mg at 08/15/17 1047  . fluticasone (FLONASE) 50 MCG/ACT nasal spray 2 spray  2 spray Each Nare Daily PRN Demetrios Loll, MD      . Derrill Memo ON 08/16/2017] furosemide (LASIX) injection 40 mg  40 mg Intravenous Daily Kolluru, Sarath, MD      . gabapentin (NEURONTIN) capsule 200 mg  200 mg Oral TID Demetrios Loll, MD   200 mg at 08/15/17 1047  . hydrALAZINE (APRESOLINE) injection 10 mg  10 mg Intravenous Q6H PRN Demetrios Loll, MD      . hydrALAZINE (APRESOLINE) tablet 50 mg  50 mg Oral TID Demetrios Loll, MD   50 mg at 08/15/17 1047  . hydrOXYzine (ATARAX/VISTARIL) tablet 50 mg  50 mg Oral TID PRN Demetrios Loll, MD      . insulin aspart (novoLOG) injection 0-15 Units  0-15 Units Subcutaneous TID [facility] Demetrios Loll, MD   5 Units at 08/15/17 1238  . insulin aspart (novoLOG) injection 0-5 Units  0-5 Units Subcutaneous QHS Demetrios Loll, MD   2 Units at 08/14/17 2248  . insulin aspart (novoLOG) injection 12 Units  12 Units Subcutaneous TID WC [name], MD   12 Units at 08/15/17 1238  . insulin glargine (LANTUS) injection 45 Units  45 Units Subcutaneous BID [name], MD   45 Units  at 08/15/17 1056  . isosorbide mononitrate (IMDUR) 24 hr tablet 60 mg  60 mg Oral Daily Demetrios Loll, MD   60 mg at 08/15/17 1048  . ketotifen (ZADITOR) 0.025 % ophthalmic solution 1 drop  1 drop Both Eyes Daily PRN Demetrios Loll, MD      . mometasone-formoterol [facility]) 200-5 MCG/ACT inhaler 2 puff  2 puff Inhalation BID Demetrios Loll, MD   2 puff at 08/15/17 1046  . morphine 2 MG/ML injection 2 mg  2 mg Intravenous Q2H PRN Byrnett, Forest Gleason, MD  2 mg at 08/15/17 1644  . nitroGLYCERIN (NITROSTAT) SL tablet 0.4 mg  0.4 mg Sublingual Q5 Min x 3 PRN Demetrios Loll, MD      . ondansetron [facility]) tablet 4 mg  4 mg Oral Q6H PRN Demetrios Loll, MD       Or  . ondansetron [facility]) injection 4 mg  4 mg Intravenous Q6H PRN Demetrios Loll, MD      . oxyCODONE (Oxy IR/ROXICODONE) immediate release tablet 5 mg  5 mg Oral Q4H PRN Robert Bellow, MD   5 mg at 08/15/17 0931  . pantoprazole (PROTONIX) EC tablet 40 mg  40 mg Oral Daily Demetrios Loll, MD   40 mg at 08/15/17 1048  . PEDIALYTE solution SOLN 240 mL  240 mL Oral Q6H Byrnett, Forest Gleason, MD   Stopped at 08/15/17 1700  . pregabalin (LYRICA) capsule 100 mg  100 mg Oral TID Demetrios Loll, MD   100 mg at 08/15/17 1047  . senna-docusate (Senokot-S) tablet 1 tablet  1 tablet Oral QHS PRN Demetrios Loll, MD      . sodium chloride flush (NS) 0.9 % injection 3 mL  3 mL Intravenous Q12H Demetrios Loll, MD   3 mL at 08/15/17 1051  . sodium chloride flush (NS) 0.9 % injection 3 mL  3 mL Intravenous PRN Demetrios Loll, MD      . ticagrelor [facility]) tablet 90 mg  90 mg Oral BID Demetrios Loll, MD   90 mg at 08/15/17 1050  . valACYclovir (VALTREX) tablet 500 mg  500 mg Oral Daily Robert Bellow, MD   500 mg at 08/15/17 1050     Discharge Medications: Please see discharge summary for a list of discharge medications.  Relevant Imaging Results:  Relevant Lab Results:   Additional Information SS# [id number]  Zettie Pho, LCSW

## 2017-08-15 NOTE — Anesthesia Preprocedure Evaluation (Signed)
Anesthesia Evaluation  Patient identified by MRN, date of birth, ID band Patient awake    Reviewed: Allergy & Precautions, H&P , NPO status , Patient's Chart, lab work & pertinent test results, reviewed documented beta blocker date and time   History of Anesthesia Complications (+) history of anesthetic complications  Airway Mallampati: III   Neck ROM: full    Dental  (+) Poor Dentition, Teeth Intact   Pulmonary neg pulmonary ROS, asthma , sleep apnea and Continuous Positive Airway Pressure Ventilation , former smoker,    Pulmonary exam normal        Cardiovascular Exercise Tolerance: Poor hypertension, On Medications + angina with exertion + CAD and +CHF  (-) Orthopnea negative cardio ROS Normal cardiovascular exam Rhythm:regular Rate:Normal     Neuro/Psych PSYCHIATRIC DISORDERS negative neurological ROS  negative psych ROS   GI/Hepatic negative GI ROS, Neg liver ROS, GERD  Medicated,(+) Hepatitis -  Endo/Other  negative endocrine ROSdiabetes  Renal/GU Renal diseasenegative Renal ROS  negative genitourinary   Musculoskeletal   Abdominal   Peds  Hematology negative hematology ROS (+) anemia ,   Anesthesia Other Findings Past Medical History: No date: Aortic stenosis     Comment:  Echo 8/18: mean 13, peak 28, LVOT/AV mean velocity 0.51 No date: Asthma     Comment:  As a child  No date: Bronchitis No date: CAD (coronary artery disease)     Comment:  a. 09/2016: 50% Ost 1st Mrg stenosis, 50% 2nd Mrg               stenosis, 20% Mid-Cx, 95% Prox LAD, 40% mid-LAD, and 10%               dist-LAD stenosis. Staged PCI with DES to Prox-LAD.  2011: Chronic combined systolic and diastolic CHF (congestive heart  failure) (HCC)     Comment:  echo 2/18: EF 55-60, normal wall motion, grade 2               diastolic dysfunction, trivial AI // echo 3/18: Septal               and apical HK, EF 45-50, normal wall motion,  trivial AI,               mild LAE, PASP 38 // echo 8/18: EF 60-65, normal wall               motion, grade 1 diastolic dysfunction, calcified aortic               valve leaflets, mild aortic stenosis (mean 13, peak 28,               LVOT/AV mean velocity 0.51), mild AI, moderate MAC, mild               LAE, trivial TR  No date: Complication of anesthesia Dx 1989: Diabetes mellitus Dx 2013: Hepatitis C Dx 1989: Hypertension No date: Obesity 2013: Pancreatitis No date: Refusal of blood transfusions as patient is Jehovah's Witness No date: Tendinitis 2010: Ulcer Past Surgical History: 10/18/5460: APPLICATION OF WOUND VAC; Left     Comment:  Procedure: APPLICATION OF WOUND VAC Exchange;  Surgeon:               Robert Bellow, MD;  Location: [facility] ORS;  Service:               General;  Laterality: Left; No date: CHOLECYSTECTOMY 09/18/2016: CORONARY STENT INTERVENTION; N/A  Comment:  Procedure: Coronary Stent Intervention;  Surgeon: Troy Sine, MD;  Location: [place] [facility];  Service:               Cardiovascular;  Laterality: N/A; No date: EYE SURGERY 08/12/2017: INCISION AND DRAINAGE ABSCESS; Left     Comment:  Procedure: INCISION AND DRAINAGE ABSCESS;  Surgeon:               Robert Bellow, MD;  Location: [facility] ORS;  Service:               General;  Laterality: Left; No date: KNEE ARTHROSCOPY 09/18/2016: LEFT HEART CATH; N/A     Comment:  Procedure: Left Heart Cath;  Surgeon: Troy Sine,               MD;  Location: [place] [facility];  Service:               Cardiovascular;  Laterality: N/A; 09/16/2016: LEFT HEART CATH AND CORONARY ANGIOGRAPHY; N/A     Comment:  Procedure: Left Heart Cath and Coronary Angiography;                Surgeon: Burnell Blanks, MD;  Location: [place] [facility];  Service: Cardiovascular;  Laterality:               N/A; 04/29/2017: LEFT HEART CATH AND CORONARY ANGIOGRAPHY; N/A     Comment:  Procedure: LEFT  HEART CATH AND CORONARY ANGIOGRAPHY;                Surgeon: Nelva Bush, MD;  Location: [place] CV               LAB;  Service: Cardiovascular;  Laterality: N/A; No date: TUBAL LIGATION No date: TUBAL LIGATION BMI    Body Mass Index:  49.11 kg/m     Reproductive/Obstetrics negative OB ROS                             Anesthesia Physical Anesthesia Plan  ASA: IV and emergent  Anesthesia Plan: General   Post-op Pain Management:    Induction:   PONV Risk Score and Plan:   Airway Management Planned:   Additional Equipment:   Intra-op Plan:   Post-operative Plan:   Informed Consent: I have reviewed the patients History and Physical, chart, labs and discussed the procedure including the risks, benefits and alternatives for the proposed anesthesia with the patient or authorized representative who has indicated his/her understanding and acceptance.   Dental Advisory Given  Plan Discussed with: CRNA  Anesthesia Plan Comments:         Anesthesia Quick Evaluation

## 2017-08-15 NOTE — Transfer of Care (Signed)
Immediate Anesthesia Transfer of Care Note  Patient: [name]  Procedure(s) Performed: exploration of wound for bleeding (Left )  Patient Location: PACU  Anesthesia Type:General  Level of Consciousness: sedated  Airway & Oxygen Therapy: Patient Spontanous Breathing and Patient connected to face mask oxygen  Post-op Assessment: Report given to RN and Post -op Vital signs reviewed and stable  Post vital signs: Reviewed and stable  Last Vitals:  Vitals:   08/15/17 0452 08/15/17 1214  BP: (!) 165/73 (!) 155/74  Pulse: 81 80  Resp: 20 17  Temp: (!) 36.3 C 36.6 C  SpO2: 99% 99%    Last Pain:  Vitals:   08/15/17 1214  TempSrc: Oral  PainSc:       Patients Stated Pain Goal: 1 (42/10/31 2811)  Complications: No apparent anesthesia complications

## 2017-08-15 NOTE — Progress Notes (Signed)
Changed L thigh dressing, was saturated w/serosanguinous fluid, aseptically.

## 2017-08-15 NOTE — Progress Notes (Signed)
Giving IV MSO4 2mg  now for pain, also giving her scheduled carvedilol w/sip H2O. NPO for OR as of 1630 today.

## 2017-08-16 ENCOUNTER — Encounter: Payer: Self-pay | Admitting: General Surgery

## 2017-08-16 ENCOUNTER — Telehealth: Payer: Self-pay

## 2017-08-16 LAB — RENAL FUNCTION PANEL
Albumin: 3.1 g/dL — ABNORMAL LOW (ref 3.5–5.0)
Anion gap: 12 (ref 5–15)
BUN: 81 mg/dL — ABNORMAL HIGH (ref 6–20)
CO2: 15 mmol/L — ABNORMAL LOW (ref 22–32)
Calcium: 7.9 mg/dL — ABNORMAL LOW (ref 8.9–10.3)
Chloride: 105 mmol/L (ref 101–111)
Creatinine, Ser: 3.96 mg/dL — ABNORMAL HIGH (ref 0.44–1.00)
GFR calc Af Amer: 13 mL/min — ABNORMAL LOW (ref >60)
GFR calc non Af Amer: 11 mL/min — ABNORMAL LOW (ref >60)
Glucose, Bld: 303 mg/dL — ABNORMAL HIGH (ref 65–99)
Phosphorus: 7 mg/dL — ABNORMAL HIGH (ref 2.5–4.6)
Potassium: 6.2 mmol/L — ABNORMAL HIGH (ref 3.5–5.1)
Sodium: 132 mmol/L — ABNORMAL LOW (ref 135–145)

## 2017-08-16 LAB — ABO/RH: ABO/RH(D): A POS

## 2017-08-16 LAB — CBC WITH DIFFERENTIAL/PLATELET
Basophils Absolute: 0 10*3/uL (ref 0–0.1)
Basophils Relative: 0 %
Eosinophils Absolute: 0 10*3/uL (ref 0–0.7)
Eosinophils Relative: 0 %
HCT: 23 % — ABNORMAL LOW (ref 35.0–47.0)
Hemoglobin: 7.5 g/dL — ABNORMAL LOW (ref 12.0–16.0)
Lymphocytes Relative: 7 %
Lymphs Abs: 0.7 10*3/uL — ABNORMAL LOW (ref 1.0–3.6)
MCH: 27.5 pg (ref 26.0–34.0)
MCHC: 32.5 g/dL (ref 32.0–36.0)
MCV: 84.5 fL (ref 80.0–100.0)
Monocytes Absolute: 0.2 10*3/uL (ref 0.2–0.9)
Monocytes Relative: 2 %
Neutro Abs: 8.9 10*3/uL — ABNORMAL HIGH (ref 1.4–6.5)
Neutrophils Relative %: 91 %
Platelets: 245 10*3/uL (ref 150–440)
RBC: 2.72 MIL/uL — ABNORMAL LOW (ref 3.80–5.20)
RDW: 16.3 % — ABNORMAL HIGH (ref 11.5–14.5)
WBC: 9.8 10*3/uL (ref 3.6–11.0)

## 2017-08-16 LAB — BASIC METABOLIC PANEL
Anion gap: 9 (ref 5–15)
BUN: 86 mg/dL — ABNORMAL HIGH (ref 6–20)
CO2: 18 mmol/L — ABNORMAL LOW (ref 22–32)
Calcium: 8.2 mg/dL — ABNORMAL LOW (ref 8.9–10.3)
Chloride: 106 mmol/L (ref 101–111)
Creatinine, Ser: 3.58 mg/dL — ABNORMAL HIGH (ref 0.44–1.00)
GFR calc Af Amer: 15 mL/min — ABNORMAL LOW (ref >60)
GFR calc non Af Amer: 13 mL/min — ABNORMAL LOW (ref >60)
Glucose, Bld: 223 mg/dL — ABNORMAL HIGH (ref 65–99)
Potassium: 5.1 mmol/L (ref 3.5–5.1)
Sodium: 133 mmol/L — ABNORMAL LOW (ref 135–145)

## 2017-08-16 LAB — GLUCOSE, CAPILLARY
Glucose-Capillary: 301 mg/dL — ABNORMAL HIGH (ref 65–99)
Glucose-Capillary: 274 mg/dL — ABNORMAL HIGH (ref 65–99)
Glucose-Capillary: 268 mg/dL — ABNORMAL HIGH (ref 65–99)
Glucose-Capillary: 309 mg/dL — ABNORMAL HIGH (ref 65–99)

## 2017-08-16 LAB — HEMOGLOBIN AND HEMATOCRIT, BLOOD
HCT: 23.4 % — ABNORMAL LOW (ref 35.0–47.0)
Hemoglobin: 7.6 g/dL — ABNORMAL LOW (ref 12.0–16.0)

## 2017-08-16 LAB — PREPARE RBC (CROSSMATCH)

## 2017-08-16 MED ORDER — SODIUM CHLORIDE 0.9 % IV SOLN
Freq: Once | INTRAVENOUS | Status: AC
  Administered 2017-08-16: 11:00:00 via INTRAVENOUS

## 2017-08-16 MED ORDER — SALINE SPRAY 0.65 % NA SOLN
1.0000 | NASAL | Status: DC | PRN
Start: 2017-08-16 — End: 2017-08-19
  Filled 2017-08-16: qty 44

## 2017-08-16 MED ORDER — INSULIN GLARGINE 100 UNIT/ML ~~LOC~~ SOLN
48.0000 [IU] | Freq: Two times a day (BID) | SUBCUTANEOUS | Status: DC
  Administered 2017-08-16 – 2017-08-19 (×6): 48 [IU] via SUBCUTANEOUS
  Filled 2017-08-16 (×8): qty 0.48

## 2017-08-16 MED ORDER — NEPRO/CARBSTEADY PO LIQD
237.0000 mL | Freq: Two times a day (BID) | ORAL | Status: DC
  Administered 2017-08-16 – 2017-08-19 (×6): 237 mL via ORAL

## 2017-08-16 MED ORDER — OCUVITE-LUTEIN PO CAPS
1.0000 | ORAL_CAPSULE | Freq: Every day | ORAL | Status: DC
  Administered 2017-08-16 – 2017-08-19 (×4): 1 via ORAL
  Filled 2017-08-16 (×4): qty 1

## 2017-08-16 MED ORDER — SODIUM CHLORIDE 0.9 % IV SOLN
1.0000 g | Freq: Once | INTRAVENOUS | Status: AC
  Administered 2017-08-16: 1 g via INTRAVENOUS
  Filled 2017-08-16: qty 10

## 2017-08-16 MED ORDER — INSULIN ASPART 100 UNIT/ML IV SOLN
10.0000 [IU] | Freq: Once | INTRAVENOUS | Status: AC
Start: 2017-08-16 — End: 2017-08-16
  Administered 2017-08-16: 10 [IU] via INTRAVENOUS
  Filled 2017-08-16: qty 0.1

## 2017-08-16 MED ORDER — SODIUM POLYSTYRENE SULFONATE 15 GM/60ML PO SUSP
30.0000 g | Freq: Once | ORAL | Status: AC
  Administered 2017-08-16: 30 g via ORAL
  Filled 2017-08-16: qty 120

## 2017-08-16 MED ORDER — DEXTROSE 50 % IV SOLN
25.0000 mL | Freq: Once | INTRAVENOUS | Status: AC
  Administered 2017-08-16: 25 mL via INTRAVENOUS
  Filled 2017-08-16: qty 50

## 2017-08-16 NOTE — Progress Notes (Signed)
[facility], [place] 08/16/17  Subjective:   Patient's creatinine has become critically elevated at 3.96, potassium is also critically high today at 6.2 She denies any nausea or vomiting.  Able to eat some Hemoglobin is down to 7.5.  Patient is seen today blood transfusion today Urine output is recorded at 1450 cc last 24 hours  Objective:  Vital signs in last 24 hours:  Temp:  [97.4 F (36.3 C)-98.5 F (36.9 C)] 98.1 F (36.7 C) (02/04 1213) Pulse Rate:  [74-92] 87 (02/04 1213) Resp:  [10-21] 20 (02/04 1213) BP: (116-159)/(45-87) 156/83 (02/04 1116) SpO2:  [92 %-100 %] 100 % (02/04 1213) Weight:  [146.1 kg (322 lb)] 146.1 kg (322 lb) (02/04 0500)  Weight change: -0.442 kg (-15.6 oz) Filed Weights   08/14/17 0524 08/15/17 0452 08/16/17 0500  Weight: (!) 140.2 kg (309 lb 1.4 oz) (!) 146.5 kg (322 lb 15.6 oz) (!) 146.1 kg (322 lb)    Intake/Output:    Intake/Output Summary (Last 24 hours) at 08/16/2017 1411 Last data filed at 08/16/2017 1400 Gross per 24 hour  Intake 1349.24 ml  Output 1810 ml  Net -460.76 ml     Physical Exam: General:  Ill-appearing, laying in the bed  HEENT  anicteric, moist oral mucous membranes  Neck  supple  Pulm/lungs  normal breathing effort, room air, clear to auscultation anteriorly and laterally  CVS/Heart  regular rhythm, 2/6 systolic murmur  Abdomen:   Soft, nontender  Extremities:  2+ pitting edema bilaterally, left thigh wound VAC  Neurologic:  Alert and oriented             Basic Metabolic Panel:  Recent Labs  Lab 08/11/17 1435 08/12/17 0423 08/13/17 0312 08/14/17 0417 08/16/17 0501  NA 135 134* 135 134* 132*  K 3.8 3.8 4.2 4.8 6.2*  CL 101 103 105 106 105  CO2 22 21* 19* 19* 15*  GLUCOSE 422* 347* 216* 249* 303*  BUN 50* 56* 56* 62* 81*  CREATININE 1.95* 2.27* 2.62* 3.37* 3.96*  CALCIUM 8.5* 8.0* 8.1* 7.6* 7.9*  PHOS  --   --   --   --  7.0*     CBC: Recent Labs  Lab 08/11/17 1435  08/12/17 0423 08/13/17 0312 08/14/17 0417 08/15/17 1043 08/16/17 0501  WBC 5.7 5.6 6.8 6.1  --  9.8  NEUTROABS 3.7  --   --   --   --  8.9*  HGB 11.0* 10.2* 9.4* 8.8* 8.4* 7.5*  HCT 34.1* 30.6* 28.8* 27.1*  --  23.0*  MCV 84.3 84.6 84.4 84.4  --  84.5  PLT 238 224 213 225  --  245     No results found for: HEPBSAG, HEPBSAB, HEPBIGM    Microbiology:  Recent Results (from the past 240 hour(s))  Surgical pcr screen     Status: None   Collection Time: 08/12/17  1:31 AM  Result Value Ref Range Status   MRSA, PCR NEGATIVE NEGATIVE Final   Staphylococcus aureus NEGATIVE NEGATIVE Final    Comment: (NOTE) The Xpert SA Assay (FDA approved for NASAL specimens in patients 60 years of age and older), is one component of a comprehensive surveillance program. It is not intended to diagnose infection nor to guide or monitor treatment. Performed at [facility], [address] [place], [place] [address]   Aerobic/Anaerobic Culture (surgical/deep wound)     Status: None (Preliminary result)   Collection Time: 08/12/17 12:34 PM  Result Value Ref Range Status  Specimen Description   Final    ABSCESS LEFT THIGH Performed at [facility], [place] [address], [place], [place] [address]    Special Requests   Final    NONE Performed at [facility], [place], [place] [address]    Gram Stain   Final    FEW WBC PRESENT,BOTH PMN AND MONONUCLEAR FEW GRAM NEGATIVE RODS    Culture   Final    HOLDING FOR POSSIBLE ANAEROBE Performed at [facility], [place] [address], [place], [place] [address]    Report Status PENDING  Incomplete    Coagulation Studies: No results for input(s): LABPROT, INR in the last 72 hours.  Urinalysis: No results for input(s): COLORURINE, LABSPEC, PHURINE, GLUCOSEU, HGBUR, BILIRUBINUR, KETONESUR, PROTEINUR, UROBILINOGEN, NITRITE, LEUKOCYTESUR in the last 72 hours.  Invalid input(s): APPERANCEUR    Imaging: Dg Chest  Port 1 View  Result Date: 08/15/2017 CLINICAL DATA:  Shortness of breath today.  Ex-smoker. EXAM: PORTABLE CHEST 1 VIEW COMPARISON:  07/15/2017. FINDINGS: Interval borderline enlarged cardiac silhouette. Stable mild prominence of the pulmonary vasculature and interstitial markings. No pleural fluid. Mild thoracic spine degenerative changes. IMPRESSION: Interval borderline cardiomegaly with stable mild pulmonary vascular congestion and mild chronic interstitial lung disease. Electronically Signed   By: Claudie Revering M.D.   On: 08/15/2017 12:16     Medications:    . allopurinol  100 mg Oral Daily  . amLODipine  10 mg Oral Daily  . atorvastatin  80 mg Oral q1800  . carvedilol  25 mg Oral BID WC  . docusate sodium  100 mg Oral Q12H  . feeding supplement (NEPRO CARB STEADY)  237 mL Oral BID BM  . ferrous sulfate  325 mg Oral BID WC  . furosemide  40 mg Intravenous Daily  . gabapentin  200 mg Oral TID  . hydrALAZINE  50 mg Oral TID  . insulin aspart  0-15 Units Subcutaneous TID WC  . insulin aspart  0-5 Units Subcutaneous QHS  . insulin aspart  12 Units Subcutaneous TID WC  . insulin glargine  48 Units Subcutaneous BID  . isosorbide mononitrate  60 mg Oral Daily  . mometasone-formoterol  2 puff Inhalation BID  . multivitamin-lutein  1 capsule Oral Daily  . pantoprazole  40 mg Oral Daily  . sodium chloride flush  3 mL Intravenous Q12H  . ticagrelor  90 mg Oral BID  . valACYclovir  500 mg Oral Daily   acetaminophen **OR** acetaminophen, albuterol, bisacodyl, fluticasone, hydrALAZINE, hydrOXYzine, ketotifen, morphine injection, nitroGLYCERIN, ondansetron **OR** ondansetron (ZOFRAN) IV, oxyCODONE, senna-docusate, sodium chloride, sodium chloride flush  Assessment/ Plan:  62 y.o. African-American female with chronic kidney disease stage III followed by Dr. Arty Baumgartner, [facility], hypertension, coronary artery disease, congestive heart failure with diastolic dysfunction, diabetes  mellitus type II, hepatitis C chronic, peptic ulcer disease, obstructive sleep apnea, who was admitted to [facility] on 08/11/2017 for Lt thigh abscess  1.  Acute renal failure on chronic kidney disease stage III , baseline creatinine 1.81, GFR of 35 on 08/05/17.  2.  Hyperkalemia 3.  Diabetes type 2 with chronic kidney disease, hemoglobin A1c 9.1% from August 11, 2017 4.  Left thigh abscess 5.  Peripheral edema  Patient likely has acute renal failure from ATN secondary to concurrent illness/thigh abscess She was getting IV Lasix for diuresis but because of worsening creatinine, it has been held She has also developed hyperkalemia which may be partly because of Pedialyte.  It  has been stopped. Repeat potassium check at 3 PM Try to achieve better control of blood sugar control Patient will be scheduled for wound VAC dressing change Her electrolytes and volume status are acceptable at present but if serum creatinine continues to worsen and potassium is not corrected, she may need hemodialysis.  This was discussed with patient.     LOS: [place] 2/4/20192:11 PM  [place], [place]

## 2017-08-16 NOTE — Progress Notes (Signed)
Inpatient Diabetes Program Recommendations  AACE/ADA: New Consensus Statement on Inpatient Glycemic Control (2015)  Target Ranges:  Prepandial:   less than 140 mg/dL      Peak postprandial:   less than 180 mg/dL (1-2 hours)      Critically ill patients:  140 - 180 mg/dL   Lab Results  Component Value Date   GLUCAP 301 (H) 08/16/2017   HGBA1C 9.1 (H) 08/11/2017    Review of Glycemic ControlResults for PARA, COSSEY (MRN [id number]) as of 08/16/2017 09:52  Ref. Range 08/15/2017 11:52 08/15/2017 16:44 08/15/2017 18:55 08/15/2017 21:21 08/16/2017 07:29  Glucose-Capillary Latest Ref Range: 65 - 99 mg/dL 213 (H) 175 (H) 157 (H) 202 (H) 301 (H)   Diabetes history:Type 2 Outpatient Diabetes medications:Novolog 25 units tid, Lantus 40 units bid  Current orders for Inpatient glycemic control:Lantus 45 units bid, Novolog 12 units tid, Novolog 0-15 units tid, Novolog 0-5 units qhs  Inpatient Diabetes Program Recommendations:    Please consider increasing Lantus to 50 units bid.  Will follow.   Thanks,  Adah Perl, RN, BC-ADM Inpatient Diabetes Coordinator Pager [phone] (8a-5p)

## 2017-08-16 NOTE — Progress Notes (Signed)
[place] at [place] NAME: [name]    MR#:  [id number]  DATE OF BIRTH:  10-08-1955  SUBJECTIVE:   Patient underwent reexploration of the wound last night given excessive bleeding from the wound.  Wound VAC on hold.  Hemoglobin down to 7.5.  Mild shortness of breath.  Sats stable. REVIEW OF SYSTEMS:    Review of Systems  Constitutional: Negative for chills and fever.  HENT: Negative for congestion and tinnitus.   Eyes: Negative for blurred vision and double vision.  Respiratory: Positive for shortness of breath. Negative for cough and wheezing.   Cardiovascular: Negative for chest pain, orthopnea and PND.  Gastrointestinal: Negative for abdominal pain, diarrhea, nausea and vomiting.  Genitourinary: Negative for dysuria and hematuria.  Neurological: Positive for weakness. Negative for dizziness, sensory change and focal weakness.  All other systems reviewed and are negative.   Nutrition: Carb control Tolerating Diet: Yes Tolerating PT: Await Eval.   DRUG ALLERGIES:   Allergies  Allergen Reactions  . Shellfish Allergy Anaphylaxis and Swelling  . Diazepam Other (See Comments)    "felt like out of body experience"  . Morphine And Related Itching    VITALS:  Blood pressure (!) 140/46, pulse 81, temperature 97.8 F (36.6 C), temperature source Oral, resp. rate (!) 21, height 5\' 8"  (1.727 m), weight (!) 146.1 kg (322 lb), SpO2 99 %.  PHYSICAL EXAMINATION:   Physical Exam  GENERAL:  62 y.o.-year-old morbidly obese patient lying in bed in no acute distress.  EYES: Pupils equal, round, reactive to light and accommodation. No scleral icterus. Extraocular muscles intact.  HEENT: Head atraumatic, normocephalic. Oropharynx and nasopharynx clear.  NECK:  Supple, no jugular venous distention. No thyroid enlargement, no tenderness.  LUNGS: Normal breath sounds bilaterally, no wheezing, rales, rhonchi. No use of accessory muscles of  respiration.  CARDIOVASCULAR: S1, S2 normal. No murmurs, rubs, or gallops.  ABDOMEN: Soft, nontender, nondistended. Bowel sounds present. No organomegaly or mass.  EXTREMITIES: No cyanosis, clubbing or edema b/l.  Left Thigh wound vac removed.dressing+ NEUROLOGIC: Cranial nerves II through XII are intact. No focal Motor or sensory deficits b/l.   PSYCHIATRIC: The patient is alert and oriented x 3.  SKIN: No obvious rash, lesion, or ulcer.    LABORATORY PANEL:   CBC Recent Labs  Lab 08/16/17 0501  WBC 9.8  HGB 7.5*  HCT 23.0*  PLT 245   ------------------------------------------------------------------------------------------------------------------  Chemistries  Recent Labs  Lab 08/11/17 1435  08/16/17 0501  NA 135   < > 132*  K 3.8   < > 6.2*  CL 101   < > 105  CO2 22   < > 15*  GLUCOSE 422*   < > 303*  BUN 50*   < > 81*  CREATININE 1.95*   < > 3.96*  CALCIUM 8.5*   < > 7.9*  AST 49*  --   --   ALT 36  --   --   ALKPHOS 260*  --   --   BILITOT 0.5  --   --    < > = values in this interval not displayed.   ------------------------------------------------------------------------------------------------------------------  Cardiac Enzymes No results for input(s): TROPONINI in the last 168 hours. ------------------------------------------------------------------------------------------------------------------  RADIOLOGY:  Dg Chest Port 1 View  Result Date: 08/15/2017 CLINICAL DATA:  Shortness of breath today.  Ex-smoker. EXAM: PORTABLE CHEST 1 VIEW COMPARISON:  07/15/2017. FINDINGS: Interval borderline enlarged cardiac silhouette. Stable mild prominence of the pulmonary  vasculature and interstitial markings. No pleural fluid. Mild thoracic spine degenerative changes. IMPRESSION: Interval borderline cardiomegaly with stable mild pulmonary vascular congestion and mild chronic interstitial lung disease. Electronically Signed   By: Claudie Revering M.D.   On: 08/15/2017 12:16      ASSESSMENT AND PLAN:   62 year old female with past medical history of hepatitis C, diabetes, chronic kidney disease stage IV, history of coronary artery disease, history of chronic diastolic CHF who presented to the hospital directly from the surgeon's office due to a left thigh abscess.  1.left thigh abscess -status post incision and drainage and wound VAC placement by general surgery POD #2 -s/p  changing the wound VAC in the OR on 08/14/17 -Wound VAC removed by Dr. Bary Castilla at bedside.  Wound looks clean and healthy.   -DC empiric vancomycin, Zosyn--Dr. Bary Castilla okay with -hgb 10.2--8.8--8.4--7.5--1 unit BT  2. Acute on chronic kidney injury stage III/4 with hyperkalemia -patient's baseline creatinine is around 2.0, currently creatinine is 2.6.--3.37--3.9 -started lasix IV bid by Nephrology -Renal dose meds, avoid nephrotoxins. -IV Dextrose +insulin, kayexalate and calcium gluconate--BMP later today -tele monitor  3. History of coronary artery disease- no acute chest pain. - cont. Coreg, Brillinta, Statin.   4. DM Type II w/out complication  - continue Lantus, NovoLog with meals and sliding scale insulin coverage. - BS stable.   5. Hyperlipidemia-continue atorvastatin.  6. diabetic neuropathy-continue gabapentin.  7. Essential hypertension-continue carvedilol, Norvasc, hydralazine, Imdur  8. GERD - cont. Protonix.   PT to see pt   D/w dr Bary Castilla All the records are reviewed and case discussed with Care Management/Social Worker. Management plans discussed with the patient, family and they are in agreement.  CODE STATUS: Full code  DVT Prophylaxis SCD only due to surgical site bleeding  TOTAL TIME TAKING CARE OF THIS PATIENT: 30 minutes.   POSSIBLE D/C IN few DAYS, DEPENDING ON CLINICAL CONDITION.   [name] M.D on 08/16/2017 at 9:11 AM  Between 7am to 6pm - Pager - [phone]  After 6pm go to [email or web address] - Technical brewer [place]  [facility]  Office  [phone]  CC: Primary care physician; [name], PA-C

## 2017-08-16 NOTE — Progress Notes (Signed)
<   100 cc of old blood under vac dressing. Area cleaned and vac reapplied at 75 mm HG continuous suction.Will follow.

## 2017-08-16 NOTE — Telephone Encounter (Signed)
Please review. Thanks!  

## 2017-08-16 NOTE — Telephone Encounter (Signed)
[name],  Can we let sleep med know that patient is currently hospitalized and will need to reschedule the sleep study that is schjeduled for 08/16/17

## 2017-08-16 NOTE — Progress Notes (Signed)
Patient ID: [name], female   DOB: [birth year], 62 y.o.   MRN: [id number] D/w pt [name] regarding transfusion 1 unit and she is agreeable with it

## 2017-08-16 NOTE — Telephone Encounter (Signed)
Patient called to advise [name] that she has a sleep study scheduled for 08/18/17, but she is currently admitted at [facility]. She would like the sleep study to be canceled.

## 2017-08-17 LAB — TYPE AND SCREEN
ABO/RH(D): A POS
Antibody Screen: NEGATIVE
Unit division: 0

## 2017-08-17 LAB — AEROBIC/ANAEROBIC CULTURE W GRAM STAIN (SURGICAL/DEEP WOUND)

## 2017-08-17 LAB — GLUCOSE, CAPILLARY
Glucose-Capillary: 157 mg/dL — ABNORMAL HIGH (ref 65–99)
Glucose-Capillary: 181 mg/dL — ABNORMAL HIGH (ref 65–99)
Glucose-Capillary: 219 mg/dL — ABNORMAL HIGH (ref 65–99)
Glucose-Capillary: 184 mg/dL — ABNORMAL HIGH (ref 65–99)

## 2017-08-17 LAB — BASIC METABOLIC PANEL
Anion gap: 12 (ref 5–15)
BUN: 99 mg/dL — ABNORMAL HIGH (ref 6–20)
CO2: 17 mmol/L — ABNORMAL LOW (ref 22–32)
Calcium: 8 mg/dL — ABNORMAL LOW (ref 8.9–10.3)
Chloride: 106 mmol/L (ref 101–111)
Creatinine, Ser: 3.23 mg/dL — ABNORMAL HIGH (ref 0.44–1.00)
GFR calc Af Amer: 17 mL/min — ABNORMAL LOW (ref >60)
GFR calc non Af Amer: 15 mL/min — ABNORMAL LOW (ref >60)
Glucose, Bld: 193 mg/dL — ABNORMAL HIGH (ref 65–99)
Potassium: 4.6 mmol/L (ref 3.5–5.1)
Sodium: 135 mmol/L (ref 135–145)

## 2017-08-17 LAB — BPAM RBC
Blood Product Expiration Date: 201902152359
ISSUE DATE / TIME: 201902041038
Unit Type and Rh: 6200

## 2017-08-17 LAB — PARATHYROID HORMONE, INTACT (NO CA): PTH: 232 pg/mL — ABNORMAL HIGH (ref 15–65)

## 2017-08-17 NOTE — Progress Notes (Signed)
[place] at [place] NAME: [name]    MR#:  [id number]  DATE OF BIRTH:  May 28, 1956  SUBJECTIVE:   Mild shortness of breath.  Sats stable.sat at the edge of the bed to eat BF REVIEW OF SYSTEMS:    Review of Systems  Constitutional: Negative for chills and fever.  HENT: Negative for congestion and tinnitus.   Eyes: Negative for blurred vision and double vision.  Respiratory: Positive for shortness of breath. Negative for cough and wheezing.   Cardiovascular: Negative for chest pain, orthopnea and PND.  Gastrointestinal: Negative for abdominal pain, diarrhea, nausea and vomiting.  Genitourinary: Negative for dysuria and hematuria.  Neurological: Positive for weakness. Negative for dizziness, sensory change and focal weakness.  All other systems reviewed and are negative.   Nutrition: Carb control Tolerating Diet: Yes Tolerating PT: Await Eval.  DRUG ALLERGIES:   Allergies  Allergen Reactions  . Shellfish Allergy Anaphylaxis and Swelling  . Diazepam Other (See Comments)    "felt like out of body experience"  . Morphine And Related Itching    VITALS:  Blood pressure (!) 147/84, pulse 87, temperature 98.6 F (37 C), temperature source Oral, resp. rate 18, height 5\' 8"  (1.727 m), weight (!) 145.5 kg (320 lb 12.3 oz), SpO2 99 %.  PHYSICAL EXAMINATION:   Physical Exam  GENERAL:  62 y.o.-year-old morbidly obese patient lying in bed in no acute distress.  EYES: Pupils equal, round, reactive to light and accommodation. No scleral icterus. Extraocular muscles intact.  HEENT: Head atraumatic, normocephalic. Oropharynx and nasopharynx clear.  NECK:  Supple, no jugular venous distention. No thyroid enlargement, no tenderness.  LUNGS: Normal breath sounds bilaterally, no wheezing, rales, rhonchi. No use of accessory muscles of respiration.  CARDIOVASCULAR: S1, S2 normal. No murmurs, rubs, or gallops.  ABDOMEN: Soft, nontender,  nondistended. Bowel sounds present. No organomegaly or mass.  EXTREMITIES: No cyanosis, clubbing or edema b/l.  Left Thigh wound vac removed.dressing+ NEUROLOGIC: Cranial nerves II through XII are intact. No focal Motor or sensory deficits b/l.   PSYCHIATRIC: The patient is alert and oriented x 3.  SKIN: No obvious rash, lesion, or ulcer.    LABORATORY PANEL:   CBC Recent Labs  Lab 08/16/17 0501 08/16/17 1501  WBC 9.8  --   HGB 7.5* 7.6*  HCT 23.0* 23.4*  PLT 245  --    ------------------------------------------------------------------------------------------------------------------  Chemistries  Recent Labs  Lab 08/11/17 1435  08/17/17 0836  NA 135   < > 135  K 3.8   < > 4.6  CL 101   < > 106  CO2 22   < > 17*  GLUCOSE 422*   < > 193*  BUN 50*   < > 99*  CREATININE 1.95*   < > 3.23*  CALCIUM 8.5*   < > 8.0*  AST 49*  --   --   ALT 36  --   --   ALKPHOS 260*  --   --   BILITOT 0.5  --   --    < > = values in this interval not displayed.   ------------------------------------------------------------------------------------------------------------------  Cardiac Enzymes No results for input(s): TROPONINI in the last 168 hours. ------------------------------------------------------------------------------------------------------------------  RADIOLOGY:  Dg Chest Port 1 View  Result Date: 08/15/2017 CLINICAL DATA:  Shortness of breath today.  Ex-smoker. EXAM: PORTABLE CHEST 1 VIEW COMPARISON:  07/15/2017. FINDINGS: Interval borderline enlarged cardiac silhouette. Stable mild prominence of the pulmonary vasculature and interstitial markings. No pleural  fluid. Mild thoracic spine degenerative changes. IMPRESSION: Interval borderline cardiomegaly with stable mild pulmonary vascular congestion and mild chronic interstitial lung disease. Electronically Signed   By: Claudie Revering M.D.   On: 08/15/2017 12:16     ASSESSMENT AND PLAN:   62 year old female with past medical  history of hepatitis C, diabetes, chronic kidney disease stage IV, history of coronary artery disease, history of chronic diastolic CHF who presented to the hospital directly from the surgeon's office due to a left thigh abscess.  1.left thigh abscess -status post incision and drainage and wound VAC placement by general surgery POD #2 -s/p  changing the wound VAC in the OR on 08/14/17 -Wound VAC removed by Dr. Bary Castilla at bedside.  Wound looks clean and healthy.   -DC empiric vancomycin, Zosyn--Dr. Bary Castilla okay with -hgb 10.2--8.8--8.4--7.5--1 unit BT--7.6  2. Acute on chronic kidney injury stage III/4 with hyperkalemia -patient's baseline creatinine is around 2.0, currently creatinine is 2.6.--3.37--3.9--3.58--3.23 -started lasix IV bid by Nephrology--HOLDING now given elevated creat -Renal dose meds, avoid nephrotoxins. -tele monitor--d/c since pt in NSR -K  6.2--treated-- 4.6  3. History of coronary artery disease- no acute chest pain. - cont. Coreg, Brillinta, Statin.   4. DM Type II w/out complication  - continue Lantus, NovoLog with meals and sliding scale insulin coverage. - BS stable.   5. Hyperlipidemia-continue atorvastatin.  6. diabetic neuropathy-continue gabapentin.  7. Essential hypertension-continue carvedilol, Norvasc, hydralazine, Imdur  8. GERD - cont. Protonix.   PT to see pt  Pt needs to be out of bed.  D/w dr Candiss Norse. All the records are reviewed and case discussed with Care Management/Social Worker. Management plans discussed with the patient, family and they are in agreement.  CODE STATUS: Full code  DVT Prophylaxis SCD only due to surgical site bleeding  TOTAL TIME TAKING CARE OF THIS PATIENT: 30 minutes.   POSSIBLE D/C IN few DAYS, DEPENDING ON CLINICAL CONDITION.   [name] M.D on 08/17/2017 at 9:39 AM  Between 7am to 6pm - Pager - [phone]  After 6pm go to [email or web address] - Technical brewer [place] [facility]  Office   [phone]  CC: Primary care physician; [name], PA-C

## 2017-08-17 NOTE — Progress Notes (Signed)
Wound very clean. Minimal blood/ drainage from Vac last 12 hours. Will start mobilization. Post TX HGB without significant change.

## 2017-08-17 NOTE — Progress Notes (Signed)
PT Cancellation Note  Patient Details Name: [name] MRN: [id number] DOB: 18-Dec-1955   Cancelled Treatment:    Reason Eval/Treat Not Completed: Patient declined, no reason specified.  Nursing notified pt that physical therapy was coming soon and pt requesting therapist come another time because she has family present.  Nursing also reporting that pt has been walking to the bathroom with a walker and SBA.  Will re-attempt PT treatment at a later date/time.  Leitha Bleak, PT 08/17/17, 4:19 PM [phone]

## 2017-08-17 NOTE — Progress Notes (Signed)
[place], [place] 08/17/17  Subjective:   This morning, patient is feeling well Denies any acute complaints States that she is able to eat without nausea or vomiting Continues to have leg swelling Potassium level has further improved to 4.6 this morning and serum creatinine is down to 3.23  Objective:  Vital signs in last 24 hours:  Temp:  [98.1 F (36.7 C)-98.6 F (37 C)] 98.6 F (37 C) (02/05 0528) Pulse Rate:  [84-92] 87 (02/05 0528) Resp:  [16-20] 18 (02/05 0528) BP: (143-177)/(73-87) 147/84 (02/05 0528) SpO2:  [98 %-100 %] 99 % (02/05 0528) Weight:  [145.5 kg (320 lb 12.3 oz)] 145.5 kg (320 lb 12.3 oz) (02/05 0528)  Weight change: -0.558 kg (-3.7 oz) Filed Weights   08/15/17 0452 08/16/17 0500 08/17/17 0528  Weight: (!) 146.5 kg (322 lb 15.6 oz) (!) 146.1 kg (322 lb) (!) 145.5 kg (320 lb 12.3 oz)    Intake/Output:    Intake/Output Summary (Last 24 hours) at 08/17/2017 1002 Last data filed at 08/17/2017 0956 Gross per 24 hour  Intake 1636.24 ml  Output 1300 ml  Net 336.24 ml     Physical Exam: General:  No acute distress, laying in the bed  HEENT  anicteric, moist oral mucous membranes  Neck  supple  Pulm/lungs  normal breathing effort, room air, mild expiratory wheezing  CVS/Heart  regular rhythm, 2/6 systolic murmur  Abdomen:   Soft, nontender  Extremities:  2+ pitting edema bilaterally, left thigh wound VAC  Neurologic:  Alert and oriented             Basic Metabolic Panel:  Recent Labs  Lab 08/13/17 0312 08/14/17 0417 08/16/17 0501 08/16/17 1501 08/17/17 0836  NA 135 134* 132* 133* 135  K 4.2 4.8 6.2* 5.1 4.6  CL 105 106 105 106 106  CO2 19* 19* 15* 18* 17*  GLUCOSE 216* 249* 303* 223* 193*  BUN 56* 62* 81* 86* 99*  CREATININE 2.62* 3.37* 3.96* 3.58* 3.23*  CALCIUM 8.1* 7.6* 7.9* 8.2* 8.0*  PHOS  --   --  7.0*  --   --      CBC: Recent Labs  Lab 08/11/17 1435 08/12/17 0423 08/13/17 0312 08/14/17 0417  08/15/17 1043 08/16/17 0501 08/16/17 1501  WBC 5.7 5.6 6.8 6.1  --  9.8  --   NEUTROABS 3.7  --   --   --   --  8.9*  --   HGB 11.0* 10.2* 9.4* 8.8* 8.4* 7.5* 7.6*  HCT 34.1* 30.6* 28.8* 27.1*  --  23.0* 23.4*  MCV 84.3 84.6 84.4 84.4  --  84.5  --   PLT 238 224 213 225  --  245  --      No results found for: HEPBSAG, HEPBSAB, HEPBIGM    Microbiology:  Recent Results (from the past 240 hour(s))  Surgical pcr screen     Status: None   Collection Time: 08/12/17  1:31 AM  Result Value Ref Range Status   MRSA, PCR NEGATIVE NEGATIVE Final   Staphylococcus aureus NEGATIVE NEGATIVE Final    Comment: (NOTE) The Xpert SA Assay (FDA approved for NASAL specimens in patients 28 years of age and older), is one component of a comprehensive surveillance program. It is not intended to diagnose infection nor to guide or monitor treatment. Performed at [facility], [address] [place], [place] [address]   Aerobic/Anaerobic Culture (surgical/deep wound)     Status: None (Preliminary result)  Collection Time: 08/12/17 12:34 PM  Result Value Ref Range Status   Specimen Description   Final    ABSCESS LEFT THIGH Performed at [facility], [address] [place], [place] [address]    Special Requests   Final    NONE Performed at [facility], [place], [place] [address]    Gram Stain   Final    FEW WBC PRESENT,BOTH PMN AND MONONUCLEAR FEW GRAM NEGATIVE RODS    Culture   Final    HOLDING FOR POSSIBLE ANAEROBE Performed at [facility], [place] [address], [place], [place] [address]    Report Status PENDING  Incomplete    Coagulation Studies: No results for input(s): LABPROT, INR in the last 72 hours.  Urinalysis: No results for input(s): COLORURINE, LABSPEC, PHURINE, GLUCOSEU, HGBUR, BILIRUBINUR, KETONESUR, PROTEINUR, UROBILINOGEN, NITRITE, LEUKOCYTESUR in the last 72 hours.  Invalid input(s): APPERANCEUR    Imaging: Dg Chest  Port 1 View  Result Date: 08/15/2017 CLINICAL DATA:  Shortness of breath today.  Ex-smoker. EXAM: PORTABLE CHEST 1 VIEW COMPARISON:  07/15/2017. FINDINGS: Interval borderline enlarged cardiac silhouette. Stable mild prominence of the pulmonary vasculature and interstitial markings. No pleural fluid. Mild thoracic spine degenerative changes. IMPRESSION: Interval borderline cardiomegaly with stable mild pulmonary vascular congestion and mild chronic interstitial lung disease. Electronically Signed   By: Claudie Revering M.D.   On: 08/15/2017 12:16     Medications:    . allopurinol  100 mg Oral Daily  . amLODipine  10 mg Oral Daily  . atorvastatin  80 mg Oral q1800  . carvedilol  25 mg Oral BID WC  . docusate sodium  100 mg Oral Q12H  . feeding supplement (NEPRO CARB STEADY)  237 mL Oral BID BM  . ferrous sulfate  325 mg Oral BID WC  . gabapentin  200 mg Oral TID  . hydrALAZINE  50 mg Oral TID  . insulin aspart  0-15 Units Subcutaneous TID WC  . insulin aspart  0-5 Units Subcutaneous QHS  . insulin aspart  12 Units Subcutaneous TID WC  . insulin glargine  48 Units Subcutaneous BID  . isosorbide mononitrate  60 mg Oral Daily  . mometasone-formoterol  2 puff Inhalation BID  . multivitamin-lutein  1 capsule Oral Daily  . pantoprazole  40 mg Oral Daily  . sodium chloride flush  3 mL Intravenous Q12H  . ticagrelor  90 mg Oral BID  . valACYclovir  500 mg Oral Daily   acetaminophen **OR** acetaminophen, albuterol, bisacodyl, fluticasone, hydrALAZINE, hydrOXYzine, ketotifen, morphine injection, nitroGLYCERIN, ondansetron **OR** ondansetron (ZOFRAN) IV, oxyCODONE, senna-docusate, sodium chloride, sodium chloride flush  Assessment/ Plan:  62 y.o. African-American female with chronic kidney disease stage III followed by Dr. Arty Baumgartner, [facility], hypertension, coronary artery disease, congestive heart failure with diastolic dysfunction, diabetes mellitus type II, hepatitis C chronic,  peptic ulcer disease, obstructive sleep apnea, who was admitted to [facility] on 08/11/2017 for Lt thigh abscess  1.  Acute renal failure on chronic kidney disease stage III , baseline creatinine 1.81, GFR of 35 on 08/05/17.  2.  Hyperkalemia 3.  Diabetes type 2 with chronic kidney disease, hemoglobin A1c 9.1% from August 11, 2017 4.  Left thigh abscess 5.  Peripheral edema  Patient likely has acute renal failure from ATN secondary to concurrent illness/thigh abscess She was getting IV Lasix for diuresis but because of worsening creatinine, it has been held Potassium level has improved after Pedialyte was stopped  Plan:  Try to achieve better control of blood sugar control Her electrolytes and volume status are acceptable and serum creatinine has started to improve.  No acute indication for dialysis. Continue to hold diuretics for now      LOS: [place] 2/5/201910:02 [place], [place]

## 2017-08-17 NOTE — Clinical Social Work Note (Signed)
CSW spoke with patient regarding the only facility that is able to offer to manage her care under medicaid. [facility] is that facility and patient is aware. Patient is going to think about her decision and let me know this afternoon. She is going to be getting up today to attempt to ambulate. Shela Leff MSW,LCSW [phone]

## 2017-08-17 NOTE — Anesthesia Postprocedure Evaluation (Signed)
Anesthesia Post Note  Patient: Brinlee Gambrell Micco  Procedure(s) Performed: exploration of wound for bleeding (Left )  Patient location during evaluation: PACU Anesthesia Type: General Level of consciousness: awake and alert Pain management: pain level controlled Vital Signs Assessment: post-procedure vital signs reviewed and stable Respiratory status: spontaneous breathing, nonlabored ventilation, respiratory function stable and patient connected to nasal cannula oxygen Cardiovascular status: blood pressure returned to baseline and stable Postop Assessment: no apparent nausea or vomiting Anesthetic complications: no     Last Vitals:  Vitals:   08/17/17 1227 08/17/17 1228  BP: (!) 150/60   Pulse: 86 86  Resp: 14   Temp: 36.8 C   SpO2: 94% 98%    Last Pain:  Vitals:   08/17/17 1227  TempSrc: Oral  PainSc:                  Molli Barrows

## 2017-08-18 ENCOUNTER — Other Ambulatory Visit: Payer: Medicaid Other

## 2017-08-18 LAB — GLUCOSE, CAPILLARY
Glucose-Capillary: 192 mg/dL — ABNORMAL HIGH (ref 65–99)
Glucose-Capillary: 197 mg/dL — ABNORMAL HIGH (ref 65–99)
Glucose-Capillary: 245 mg/dL — ABNORMAL HIGH (ref 65–99)
Glucose-Capillary: 176 mg/dL — ABNORMAL HIGH (ref 65–99)

## 2017-08-18 LAB — BASIC METABOLIC PANEL
Anion gap: 11 (ref 5–15)
BUN: 105 mg/dL — ABNORMAL HIGH (ref 6–20)
CO2: 18 mmol/L — ABNORMAL LOW (ref 22–32)
Calcium: 8.1 mg/dL — ABNORMAL LOW (ref 8.9–10.3)
Chloride: 108 mmol/L (ref 101–111)
Creatinine, Ser: 3.25 mg/dL — ABNORMAL HIGH (ref 0.44–1.00)
GFR calc Af Amer: 17 mL/min — ABNORMAL LOW (ref >60)
GFR calc non Af Amer: 15 mL/min — ABNORMAL LOW (ref >60)
Glucose, Bld: 229 mg/dL — ABNORMAL HIGH (ref 65–99)
Potassium: 4.6 mmol/L (ref 3.5–5.1)
Sodium: 137 mmol/L (ref 135–145)

## 2017-08-18 MED ORDER — INSULIN ASPART 100 UNIT/ML ~~LOC~~ SOLN
15.0000 [IU] | Freq: Three times a day (TID) | SUBCUTANEOUS | Status: DC
  Administered 2017-08-18 – 2017-08-19 (×5): 15 [IU] via SUBCUTANEOUS
  Filled 2017-08-18 (×5): qty 1

## 2017-08-18 NOTE — Progress Notes (Signed)
Wound vac changed. Scant amount of old clot ( < 2 cc). No loculated fluid. Tissue healthy. No odoer. Adaptic followed by black sponge applied at -70mm HG pressure. OK for d/c tomorrow. Vac change at [facility] facility. F/U in my office in two weeks.

## 2017-08-18 NOTE — Clinical Social Work Note (Signed)
Patient has accepted bed at [facility]. Patient can go to facility tomorrow if medically ready for discharge. They will provide wound vac.  Shela Leff MSW,LCSW [phone]

## 2017-08-18 NOTE — Progress Notes (Signed)
Reinforced wound vac tubing today as vac kept giving reading of blockage detected. Patient tolerated well. Wound vac now working properly, patient has had steady amount of drainage through out the day up to this point at 150 ml.

## 2017-08-18 NOTE — Progress Notes (Signed)
Inpatient Diabetes Program Recommendations  AACE/ADA: New Consensus Statement on Inpatient Glycemic Control (2015)  Target Ranges:  Prepandial:   less than 140 mg/dL      Peak postprandial:   less than 180 mg/dL (1-2 hours)      Critically ill patients:  140 - 180 mg/dL   Lab Results  Component Value Date   GLUCAP 192 (H) 08/18/2017   HGBA1C 9.1 (H) 08/11/2017    Review of Glycemic ControlResults for MCKAILA, DUFFUS (MRN [id number]) as of 08/18/2017 10:38  Ref. Range 08/17/2017 07:47 08/17/2017 11:42 08/17/2017 16:53 08/17/2017 21:24 08/18/2017 07:50  Glucose-Capillary Latest Ref Range: 65 - 99 mg/dL 157 (H) 181 (H) 219 (H) 184 (H) 192 (H)   Diabetes history:Type 2 Outpatient Diabetes medications:Novolog 25 units tid, Lantus 40 units bid Current orders for Inpatient glycemic control:Lantus 45units bid, Novolog 12units tid, Novolog 0-15 units tid, Novolog 0-5 units qhs Inpatient Diabetes Program Recommendations:   May consider increasing Novolog to 15 units tid with meals- Will follow.   Thanks,  Adah Perl, RN, BC-ADM Inpatient Diabetes Coordinator Pager [phone] (8a-5p)

## 2017-08-18 NOTE — Progress Notes (Signed)
[place] [facility] [place] NAME: Marsa Matteo    MR#:  [id number]  DATE OF BIRTH:  1956-06-06  SUBJECTIVE:   Mild shortness of breath.  Patient in bleeding in the hallways with physical therapy REVIEW OF SYSTEMS:    Review of Systems  Constitutional: Negative for chills and fever.  HENT: Negative for congestion and tinnitus.   Eyes: Negative for blurred vision and double vision.  Respiratory: Positive for shortness of breath. Negative for cough and wheezing.   Cardiovascular: Negative for chest pain, orthopnea and PND.  Gastrointestinal: Negative for abdominal pain, diarrhea, nausea and vomiting.  Genitourinary: Negative for dysuria and hematuria.  Neurological: Positive for weakness. Negative for dizziness, sensory change and focal weakness.  All other systems reviewed and are negative.   Nutrition: Carb control Tolerating Diet: Yes Tolerating PT: Await Eval.  DRUG ALLERGIES:   Allergies  Allergen Reactions  . Shellfish Allergy Anaphylaxis and Swelling  . Diazepam Other (See Comments)    "felt like out of body experience"  . Morphine And Related Itching    VITALS:  Blood pressure 136/67, pulse 84, temperature 98.4 F (36.9 C), temperature source Oral, resp. rate 20, height 5\' 8"  (1.727 m), weight (!) 151.2 kg (333 lb 5.4 oz), SpO2 100 %.  PHYSICAL EXAMINATION:   Physical Exam  GENERAL:  62 y.o.-year-old morbidly obese patient lying in bed in no acute distress.  EYES: Pupils equal, round, reactive to light and accommodation. No scleral icterus. Extraocular muscles intact.  HEENT: Head atraumatic, normocephalic. Oropharynx and nasopharynx clear.  NECK:  Supple, no jugular venous distention. No thyroid enlargement, no tenderness.  LUNGS: Normal breath sounds bilaterally, no wheezing, rales, rhonchi. No use of accessory muscles of respiration.  CARDIOVASCULAR: S1, S2 normal. No murmurs, rubs, or gallops.  ABDOMEN: Soft, nontender,  nondistended. Bowel sounds present. No organomegaly or mass.  EXTREMITIES: No cyanosis, clubbing or edema b/l.  Left Thigh wound vac removed.dressing+ NEUROLOGIC: Cranial nerves II through XII are intact. No focal Motor or sensory deficits b/l.   PSYCHIATRIC: The patient is alert and oriented x 3.  SKIN: No obvious rash, lesion, or ulcer.    LABORATORY PANEL:   CBC Recent Labs  Lab 08/16/17 0501 08/16/17 1501  WBC 9.8  --   HGB 7.5* 7.6*  HCT 23.0* 23.4*  PLT 245  --    ------------------------------------------------------------------------------------------------------------------  Chemistries  Recent Labs  Lab 08/11/17 1435  08/18/17 0534  NA 135   < > 137  K 3.8   < > 4.6  CL 101   < > 108  CO2 22   < > 18*  GLUCOSE 422*   < > 229*  BUN 50*   < > 105*  CREATININE 1.95*   < > 3.25*  CALCIUM 8.5*   < > 8.1*  AST 49*  --   --   ALT 36  --   --   ALKPHOS 260*  --   --   BILITOT 0.5  --   --    < > = values in this interval not displayed.   ------------------------------------------------------------------------------------------------------------------  Cardiac Enzymes No results for input(s): TROPONINI in the last 168 hours. ------------------------------------------------------------------------------------------------------------------  RADIOLOGY:  No results found.   ASSESSMENT AND PLAN:   62 year old female with past medical history of hepatitis C, diabetes, chronic kidney disease stage IV, history of coronary artery disease, history of chronic diastolic CHF who presented to the hospital directly from the surgeon's office due to  a left thigh abscess.  1.left thigh abscess -status post incision and drainage and wound VAC placement by general surgery POD #2 -s/p  changing the wound VAC in the OR on 08/14/17 -Wound VAC removed by Dr. Bary Castilla at bedside.  Wound looks clean and healthy.   -DC empiric vancomycin, Zosyn--Dr. Bary Castilla okay with -hgb  10.2--8.8--8.4--7.5--1 unit BT--7.6  2. Acute on chronic kidney injury stage III/4 with hyperkalemia -patient's baseline creatinine is around 2.0, currently creatinine is 2.6.--3.37--3.9--3.58--3.23--3.2 -started lasix IV bid by Nephrology--HOLDING now given elevated creat -Renal dose meds, avoid nephrotoxins. -tele monitor--d/c since pt in NSR -K  6.2--treated-- 4.6  3. History of coronary artery disease- no acute chest pain. - cont. Coreg, Brillinta, Statin.   4. DM Type II w/out complication  - continue Lantus, NovoLog with meals and sliding scale insulin coverage. - BS stable.   5. Hyperlipidemia-continue atorvastatin.  6. diabetic neuropathy-continue gabapentin.  7. Essential hypertension-continue carvedilol, Norvasc, hydralazine, Imdur  8. GERD - cont. Protonix.   PT to see pt --- ambulating in the hall   D/w dr Candiss Norse. All the records are reviewed and case discussed with Care Management/Social Worker. Management plans discussed with the patient, family and they are in agreement.  CODE STATUS: Full code  DVT Prophylaxis SCD only due to surgical site bleeding  TOTAL TIME TAKING CARE OF THIS PATIENT: 30 minutes.   POSSIBLE D/C IN few DAYS, DEPENDING ON CLINICAL CONDITION.   [name] M.D on 08/18/2017 at 11:49 AM  Between 7am to 6pm - Pager - [phone]  After 6pm go to [email or web address] - Technical brewer [facility]  Office  [phone]  CC: Primary care physician; [name], PA-C

## 2017-08-18 NOTE — Progress Notes (Signed)
[place], [place] 08/18/17  Subjective:   This morning, patient is feeling well Denies any acute complaints States that she is able to eat without nausea or vomiting Continues to have leg swelling Urine output has increased to 1550 cc Serum creatinine about the same as yesterday at 3.25, however, BUN level is high at 105  Objective:  Vital signs in last 24 hours:  Temp:  [98.2 F (36.8 C)-98.7 F (37.1 C)] 98.3 F (36.8 C) (02/06 1220) Pulse Rate:  [82-87] 82 (02/06 1220) Resp:  [16-20] 16 (02/06 1220) BP: (134-167)/(57-78) 156/73 (02/06 1220) SpO2:  [99 %-100 %] 99 % (02/06 1220) Weight:  [151.2 kg (333 lb 5.4 oz)] 151.2 kg (333 lb 5.4 oz) (02/06 0509)  Weight change: 5.7 kg (12 lb 9.1 oz) Filed Weights   08/16/17 0500 08/17/17 0528 08/18/17 0509  Weight: (!) 146.1 kg (322 lb) (!) 145.5 kg (320 lb 12.3 oz) (!) 151.2 kg (333 lb 5.4 oz)    Intake/Output:    Intake/Output Summary (Last 24 hours) at 08/18/2017 1248 Last data filed at 08/18/2017 1126 Gross per 24 hour  Intake 724 ml  Output 600 ml  Net 124 ml     Physical Exam: General:  No acute distress, laying in the bed  HEENT  anicteric, moist oral mucous membranes  Neck  supple  Pulm/lungs  normal breathing effort, room air, mild expiratory wheezing  CVS/Heart  regular rhythm, 2/6 systolic murmur  Abdomen:   Soft, nontender  Extremities:  2+ pitting edema bilaterally, left thigh wound VAC  Neurologic:  Alert and oriented             Basic Metabolic Panel:  Recent Labs  Lab 08/14/17 0417 08/16/17 0501 08/16/17 1501 08/17/17 0836 08/18/17 0534  NA 134* 132* 133* 135 137  K 4.8 6.2* 5.1 4.6 4.6  CL 106 105 106 106 108  CO2 19* 15* 18* 17* 18*  GLUCOSE 249* 303* 223* 193* 229*  BUN 62* 81* 86* 99* 105*  CREATININE 3.37* 3.96* 3.58* 3.23* 3.25*  CALCIUM 7.6* 7.9* 8.2* 8.0* 8.1*  PHOS  --  7.0*  --   --   --      CBC: Recent Labs  Lab 08/11/17 1435 08/12/17 0423  08/13/17 0312 08/14/17 0417 08/15/17 1043 08/16/17 0501 08/16/17 1501  WBC 5.7 5.6 6.8 6.1  --  9.8  --   NEUTROABS 3.7  --   --   --   --  8.9*  --   HGB 11.0* 10.2* 9.4* 8.8* 8.4* 7.5* 7.6*  HCT 34.1* 30.6* 28.8* 27.1*  --  23.0* 23.4*  MCV 84.3 84.6 84.4 84.4  --  84.5  --   PLT 238 224 213 225  --  245  --      No results found for: HEPBSAG, HEPBSAB, HEPBIGM    Microbiology:  Recent Results (from the past 240 hour(s))  Surgical pcr screen     Status: None   Collection Time: 08/12/17  1:31 AM  Result Value Ref Range Status   MRSA, PCR NEGATIVE NEGATIVE Final   Staphylococcus aureus NEGATIVE NEGATIVE Final    Comment: (NOTE) The Xpert SA Assay (FDA approved for NASAL specimens in patients 33 years of age and older), is one component of a comprehensive surveillance program. It is not intended to diagnose infection nor to guide or monitor treatment. Performed at [facility], [address] [place], [place] [address]   Aerobic/Anaerobic Culture (surgical/deep wound)  Status: None   Collection Time: 08/12/17 12:34 PM  Result Value Ref Range Status   Specimen Description   Final    ABSCESS LEFT THIGH Performed at [facility], [address] [place], [place] [address]    Special Requests   Final    NONE Performed at [facility], [place],  [address]    Gram Stain   Final    FEW WBC PRESENT,BOTH PMN AND MONONUCLEAR FEW GRAM NEGATIVE RODS    Culture   Final    MODERATE PREVOTELLA ORIS BETA LACTAMASE POSITIVE Performed at [facility], [place] [address] [place],  [address]    Report Status 08/17/2017 FINAL  Final    Coagulation Studies: No results for input(s): LABPROT, INR in the last 72 hours.  Urinalysis: No results for input(s): COLORURINE, LABSPEC, PHURINE, GLUCOSEU, HGBUR, BILIRUBINUR, KETONESUR, PROTEINUR, UROBILINOGEN, NITRITE, LEUKOCYTESUR in the last 72 hours.  Invalid input(s):  APPERANCEUR    Imaging: No results found.   Medications:    . allopurinol  100 mg Oral Daily  . amLODipine  10 mg Oral Daily  . atorvastatin  80 mg Oral q1800  . carvedilol  25 mg Oral BID WC  . docusate sodium  100 mg Oral Q12H  . feeding supplement (NEPRO CARB STEADY)  237 mL Oral BID BM  . ferrous sulfate  325 mg Oral BID WC  . gabapentin  200 mg Oral TID  . hydrALAZINE  50 mg Oral TID  . insulin aspart  0-15 Units Subcutaneous TID WC  . insulin aspart  0-5 Units Subcutaneous QHS  . insulin aspart  15 Units Subcutaneous TID WC  . insulin glargine  48 Units Subcutaneous BID  . isosorbide mononitrate  60 mg Oral Daily  . mometasone-formoterol  2 puff Inhalation BID  . multivitamin-lutein  1 capsule Oral Daily  . pantoprazole  40 mg Oral Daily  . sodium chloride flush  3 mL Intravenous Q12H  . ticagrelor  90 mg Oral BID  . valACYclovir  500 mg Oral Daily   acetaminophen **OR** acetaminophen, albuterol, bisacodyl, fluticasone, hydrALAZINE, hydrOXYzine, ketotifen, morphine injection, nitroGLYCERIN, ondansetron **OR** ondansetron (ZOFRAN) IV, oxyCODONE, senna-docusate, sodium chloride, sodium chloride flush  Assessment/ Plan:  62 y.o. African-American female with chronic kidney disease stage III followed by Dr. Arty Baumgartner, [facility], hypertension, coronary artery disease, congestive heart failure with diastolic dysfunction, diabetes mellitus type II, hepatitis C chronic, peptic ulcer disease, obstructive sleep apnea, who was admitted to [facility] on 08/11/2017 for Lt thigh abscess  1.  Acute renal failure on chronic kidney disease stage III , baseline creatinine 1.81, GFR of 35 on 08/05/17.  2.  Hyperkalemia 3.  Diabetes type 2 with chronic kidney disease, hemoglobin A1c 9.1% from August 11, 2017 4.  Left thigh abscess 5.  Peripheral edema  Patient likely has acute renal failure from ATN secondary to concurrent illness/thigh abscess She was getting IV Lasix for  diuresis but because of worsening creatinine, it has been held Potassium level has improved after Pedialyte was stopped   Plan:  Try to achieve better control of blood sugar control Her electrolytes and volume status are acceptable and serum creatinine has started to improve.  Urine output 1500 cc yesterday.  No acute indication for dialysis. Continue to hold diuretics for now    LOS: [place] 2/6/201912:48 [facility] [place], [place]

## 2017-08-18 NOTE — Progress Notes (Signed)
Physical Therapy Treatment Patient Details Name: [name] MRN: [id number] DOB: 03/09/1956 Today's Date: 08/18/2017    History of Present Illness 62 yo female with onset L thigh abscess was admitted for I and D with vac placed.  Had recent admission to [facility] for drainage and still has been unhealed.  Has had wound 2-3 months and is now having AKI.  Pt returned to OR 08/15/17 for evacuation of hematoma, wound irrigation, and wound vac application (new PT orders received).  PMHx:  DM, CAD, CKD, HTN, pancreatitis, aortic stenosis    PT Comments    Pt able to progress to ambulating at least 90 feet with RW; pt with slow gait speed with mild decreased stance time L LE and some increased effort noted to advance L LE but overall steady and safe with ambulation using RW.  Pain 0/10 L thigh beginning and end of session but 8.5/10 with ambulation (nursing notified).  Will continue to progress pt with strengthening and increasing ambulation distance per pt tolerance.  Pt is progressing well and anticipate (in terms of functional mobility) pt would be able to discharge home with HHPT (SW notified).    Follow Up Recommendations  Home health PT     Equipment Recommendations  Rolling walker with 5" wheels    Recommendations for Other Services       Precautions / Restrictions Precautions Precautions: Fall Precaution Comments: L thigh wound vac Restrictions Weight Bearing Restrictions: No    Mobility  Bed Mobility Overal bed mobility: Needs Assistance Bed Mobility: Supine to Sit     Supine to sit: Supervision;HOB elevated     General bed mobility comments: increased effort and time to perform supine to sit but no physical assist required  Transfers Overall transfer level: Needs assistance Equipment used: Rolling walker (2 wheeled) Transfers: Sit to/from [facility] Sit to Stand: Min guard Stand pivot transfers: Min guard       General transfer comment: pt using  momentum to stand from bed but only required one attempt to stand with RW; able to stand well from [facility] using BSC armrests  Ambulation/Gait Ambulation/Gait assistance: Min guard;Supervision Ambulation Distance (Feet): (20 feet to bathroom; at least 90 feet) Assistive device: Rolling walker (2 wheeled)   Gait velocity: slow gait speed but steady   General Gait Details: mild decreased stance time L LE; increased effort to advance L LE noted; decreased B step length; steady with RW   Stairs            Wheelchair Mobility    Modified Rankin (Stroke Patients Only)       Balance Overall balance assessment: Needs assistance Sitting-balance support: No upper extremity supported;Feet supported Sitting balance-Leahy Scale: Normal Sitting balance - Comments: steady sitting reaching outside BOS   Standing balance support: Single extremity supported Standing balance-Leahy Scale: Poor Standing balance comment: Pt able to perform toilet hygiene standing with single UE support (pt steady)                            Cognition Arousal/Alertness: Awake/alert Behavior During Therapy: WFL for tasks assessed/performed Overall Cognitive Status: Within Functional Limits for tasks assessed                                        Exercises      General Comments General comments (skin integrity,  edema, etc.): Wound vac to L thigh (working during session but shortly after pt sat in chair end of session wound vac started beeping (blockage); nursing notified immediately and came to assess.  Nursing cleared pt for participation in physical therapy.  Pt agreeable to PT session.      Pertinent Vitals/Pain Pain Assessment: 0-10 Pain Score: 0-No pain(0/10 at rest beginning and end of session; 8.5/10 with ambulation) Pain Location: L thigh with mobility Pain Descriptors / Indicators: Burning;Sore;Aching;Tender Pain Intervention(s): Limited activity within patient's  tolerance;Monitored during session;Premedicated before session;Repositioned;Other (comment)(RN notified of pt's pain during session)  Vitals (HR and O2 on room air) stable and WFL throughout treatment session.    Home Living                      Prior Function            PT Goals (current goals can now be found in the care plan section) Acute Rehab PT Goals Patient Stated Goal: to improve mobility PT Goal Formulation: With patient Time For Goal Achievement: 09/01/17 Potential to Achieve Goals: Good Progress towards PT goals: Progressing toward goals    Frequency    Min 2X/week      PT Plan Discharge plan needs to be updated(SW notified)    Co-evaluation              AM-PAC PT "6 Clicks" Daily Activity  Outcome Measure  Difficulty turning over in bed (including adjusting bedclothes, sheets and blankets)?: A Lot Difficulty moving from lying on back to sitting on the side of the bed? : A Lot Difficulty sitting down on and standing up from a chair with arms (e.g., wheelchair, bedside commode, etc,.)?: A Little Help needed moving to and from a bed to chair (including a wheelchair)?: A Little Help needed walking in hospital room?: A Little Help needed climbing 3-5 steps with a railing? : A Little 6 Click Score: 16    End of Session Equipment Utilized During Treatment: Gait belt Activity Tolerance: Patient tolerated treatment well Patient left: in chair;with call bell/phone within reach;with chair alarm set;with nursing/sitter in room Nurse Communication: Mobility status;Precautions;Other (comment)(Pt's pain status during session; wound vac beeping) PT Visit Diagnosis: Pain;Difficulty in walking, not elsewhere classified (R26.2);Muscle weakness (generalized) (M62.81);Other abnormalities of gait and mobility (R26.89) Pain - Right/Left: Left Pain - part of body: Leg     Time: 3295-1884 PT Time Calculation (min) (ACUTE ONLY): 40 min  Charges:  $Gait  Training: 8-22 mins $Therapeutic Exercise: 8-22 mins $Therapeutic Activity: 8-22 mins                    G CodesLeitha Bleak, PT 08/18/17, 11:28 AM [phone]

## 2017-08-19 LAB — BASIC METABOLIC PANEL
Anion gap: 12 (ref 5–15)
BUN: 105 mg/dL — ABNORMAL HIGH (ref 6–20)
CO2: 17 mmol/L — ABNORMAL LOW (ref 22–32)
Calcium: 8.4 mg/dL — ABNORMAL LOW (ref 8.9–10.3)
Chloride: 109 mmol/L (ref 101–111)
Creatinine, Ser: 2.9 mg/dL — ABNORMAL HIGH (ref 0.44–1.00)
GFR calc Af Amer: 19 mL/min — ABNORMAL LOW (ref >60)
GFR calc non Af Amer: 17 mL/min — ABNORMAL LOW (ref >60)
Glucose, Bld: 322 mg/dL — ABNORMAL HIGH (ref 65–99)
Potassium: 4.6 mmol/L (ref 3.5–5.1)
Sodium: 138 mmol/L (ref 135–145)

## 2017-08-19 LAB — GLUCOSE, CAPILLARY
Glucose-Capillary: 259 mg/dL — ABNORMAL HIGH (ref 65–99)
Glucose-Capillary: 211 mg/dL — ABNORMAL HIGH (ref 65–99)
Glucose-Capillary: 213 mg/dL — ABNORMAL HIGH (ref 65–99)

## 2017-08-19 MED ORDER — FUROSEMIDE 40 MG PO TABS: 40.0000 mg | ORAL_TABLET | ORAL | 1 refills | Status: DC

## 2017-08-19 MED ORDER — OXYCODONE-ACETAMINOPHEN 5-325 MG PO TABS: 1.0000 | ORAL_TABLET | Freq: Four times a day (QID) | ORAL | 0 refills | Status: DC | PRN

## 2017-08-19 MED ORDER — INSULIN ASPART 100 UNIT/ML FLEXPEN: 15.0000 [IU] | PEN_INJECTOR | Freq: Three times a day (TID) | SUBCUTANEOUS | 3 refills | Status: DC

## 2017-08-19 MED ORDER — INSULIN GLARGINE 100 UNIT/ML SOLOSTAR PEN: 48.0000 [IU] | PEN_INJECTOR | Freq: Two times a day (BID) | SUBCUTANEOUS | 2 refills | Status: DC

## 2017-08-19 MED ORDER — GABAPENTIN 100 MG PO CAPS: 100.0000 mg | ORAL_CAPSULE | Freq: Three times a day (TID) | ORAL | 1 refills | Status: DC

## 2017-08-19 NOTE — Progress Notes (Signed)
[place], [place] 08/19/17  Subjective:   This morning, patient is feeling well Denies any acute complaints States that she is able to eat without nausea or vomiting Continues to have leg swelling Urine output recorded at 1300 cc Patient is scheduled to go home today Serum creatinine has improved to 2.90, BUN 105  Objective:  Vital signs in last 24 hours:  Temp:  [97.9 F (36.6 C)-98.8 F (37.1 C)] 97.9 F (36.6 C) (02/07 0507) Pulse Rate:  [82-89] 82 (02/07 0838) Resp:  [14-20] 20 (02/07 0507) BP: (135-162)/(60-73) 159/61 (02/07 0838) SpO2:  [99 %-100 %] 100 % (02/07 0507) Weight:  [148.3 kg (326 lb 14.4 oz)] 148.3 kg (326 lb 14.4 oz) (02/07 0507)  Weight change: -2.919 kg (-7 oz) Filed Weights   08/17/17 0528 08/18/17 0509 08/19/17 0507  Weight: (!) 145.5 kg (320 lb 12.3 oz) (!) 151.2 kg (333 lb 5.4 oz) (!) 148.3 kg (326 lb 14.4 oz)    Intake/Output:    Intake/Output Summary (Last 24 hours) at 08/19/2017 1003 Last data filed at 08/19/2017 4034 Gross per 24 hour  Intake 600 ml  Output 1476 ml  Net -876 ml     Physical Exam: General:  No acute distress, laying in the bed  HEENT  anicteric, moist oral mucous membranes  Neck  supple  Pulm/lungs  normal breathing effort, room air, mild expiratory wheezing  CVS/Heart  regular rhythm, 2/6 systolic murmur  Abdomen:   Soft, nontender  Extremities:  2+ pitting edema bilaterally, left thigh wound VAC  Neurologic:  Alert and oriented             Basic Metabolic Panel:  Recent Labs  Lab 08/16/17 0501 08/16/17 1501 08/17/17 0836 08/18/17 0534 08/19/17 0502  NA 132* 133* 135 137 138  K 6.2* 5.1 4.6 4.6 4.6  CL 105 106 106 108 109  CO2 15* 18* 17* 18* 17*  GLUCOSE 303* 223* 193* 229* 322*  BUN 81* 86* 99* 105* 105*  CREATININE 3.96* 3.58* 3.23* 3.25* 2.90*  CALCIUM 7.9* 8.2* 8.0* 8.1* 8.4*  PHOS 7.0*  --   --   --   --      CBC: Recent Labs  Lab 08/13/17 0312 08/14/17 0417  08/15/17 1043 08/16/17 0501 08/16/17 1501  WBC 6.8 6.1  --  9.8  --   NEUTROABS  --   --   --  8.9*  --   HGB 9.4* 8.8* 8.4* 7.5* 7.6*  HCT 28.8* 27.1*  --  23.0* 23.4*  MCV 84.4 84.4  --  84.5  --   PLT 213 225  --  245  --      No results found for: HEPBSAG, HEPBSAB, HEPBIGM    Microbiology:  Recent Results (from the past 240 hour(s))  Surgical pcr screen     Status: None   Collection Time: 08/12/17  1:31 AM  Result Value Ref Range Status   MRSA, PCR NEGATIVE NEGATIVE Final   Staphylococcus aureus NEGATIVE NEGATIVE Final    Comment: (NOTE) The Xpert SA Assay (FDA approved for NASAL specimens in patients 59 years of age and older), is one component of a comprehensive surveillance program. It is not intended to diagnose infection nor to guide or monitor treatment. Performed at [facility], [address], [place], [place] [address]   Aerobic/Anaerobic Culture (surgical/deep wound)     Status: None   Collection Time: 08/12/17 12:34 PM  Result Value Ref Range Status  Specimen Description   Final    ABSCESS LEFT THIGH Performed at [facility], [place] [address], [place], [place] [address]    Special Requests   Final    NONE Performed at [facility], [place], [place] [address]    Gram Stain   Final    FEW WBC PRESENT,BOTH PMN AND MONONUCLEAR FEW GRAM NEGATIVE RODS    Culture   Final    MODERATE PREVOTELLA ORIS BETA LACTAMASE POSITIVE Performed at [facility], [place] [address], [place],  [address]    Report Status 08/17/2017 FINAL  Final    Coagulation Studies: No results for input(s): LABPROT, INR in the last 72 hours.  Urinalysis: No results for input(s): COLORURINE, LABSPEC, PHURINE, GLUCOSEU, HGBUR, BILIRUBINUR, KETONESUR, PROTEINUR, UROBILINOGEN, NITRITE, LEUKOCYTESUR in the last 72 hours.  Invalid input(s): APPERANCEUR    Imaging: No results found.   Medications:    . allopurinol  100 mg  Oral Daily  . amLODipine  10 mg Oral Daily  . atorvastatin  80 mg Oral q1800  . carvedilol  25 mg Oral BID WC  . docusate sodium  100 mg Oral Q12H  . feeding supplement (NEPRO CARB STEADY)  237 mL Oral BID BM  . ferrous sulfate  325 mg Oral BID WC  . gabapentin  200 mg Oral TID  . hydrALAZINE  50 mg Oral TID  . insulin aspart  0-15 Units Subcutaneous TID WC  . insulin aspart  0-5 Units Subcutaneous QHS  . insulin aspart  15 Units Subcutaneous TID WC  . insulin glargine  48 Units Subcutaneous BID  . isosorbide mononitrate  60 mg Oral Daily  . mometasone-formoterol  2 puff Inhalation BID  . multivitamin-lutein  1 capsule Oral Daily  . pantoprazole  40 mg Oral Daily  . sodium chloride flush  3 mL Intravenous Q12H  . ticagrelor  90 mg Oral BID  . valACYclovir  500 mg Oral Daily   acetaminophen **OR** acetaminophen, albuterol, bisacodyl, fluticasone, hydrALAZINE, hydrOXYzine, ketotifen, nitroGLYCERIN, ondansetron **OR** ondansetron (ZOFRAN) IV, oxyCODONE, senna-docusate, sodium chloride, sodium chloride flush  Assessment/ Plan:  62 y.o. African-American female with chronic kidney disease stage III followed by Dr. Arty Baumgartner, [facility], hypertension, coronary artery disease, congestive heart failure with diastolic dysfunction, diabetes mellitus type II, hepatitis C chronic, peptic ulcer disease, obstructive sleep apnea, who was admitted to [facility] on 08/11/2017 for Lt thigh abscess  1.  Acute renal failure on chronic kidney disease stage III , baseline creatinine 1.81, GFR of 35 on 08/05/17.  2.  Hyperkalemia 3.  Diabetes type 2 with chronic kidney disease, hemoglobin A1c 9.1% from August 11, 2017 4.  Left thigh abscess 5.  Peripheral edema  Patient likely has acute renal failure from ATN secondary to concurrent illness/thigh abscess She was getting IV Lasix for diuresis but because of worsening creatinine, it has been held Potassium level has improved after Pedialyte was  stopped   Plan:  Try to achieve better control of blood sugar control Her electrolytes and volume status are acceptable and serum creatinine has started to improve.  Urine output 1300 cc yesterday.   We will schedule office follow-up for her next week    LOS: [place] 2/7/201910:03 [place], [place]

## 2017-08-19 NOTE — Care Management (Signed)
Notified by CSW that patient has changed her mind and wishes to return home with home health services.  Corene Cornea with [facility] notified.  Home wound vac has already been delivered to room.  Bedside RN to place home vac on wound prior to discharge.  Home health services will start tomorrow by [facility] with initial dressing change on 08/20/17.  Patient to follow up in [facility] office.  RNCM signing off.

## 2017-08-19 NOTE — Progress Notes (Signed)
Initial Nutrition Assessment  DOCUMENTATION CODES:   Morbid obesity  INTERVENTION:   Nepro Shake po BID, each supplement provides 425 kcal and 19 grams protein  Ocuvite daily for wound healing (provides zinc, vitamin A, vitamin C, Vitamin E, copper, and selenium)  NUTRITION DIAGNOSIS:   Increased nutrient needs related to wound healing as evidenced by increased estimated needs from protein.  GOAL:   Patient will meet greater than or equal to 90% of their needs  MONITOR:   PO intake, Supplement acceptance, Weight trends, Skin, I & O's  REASON FOR ASSESSMENT:   LOS    ASSESSMENT:   62 year old female with past medical history of hepatitis C, diabetes, chronic kidney disease stage IV, history of coronary artery disease, history of chronic diastolic CHF who presented to the hospital directly from the surgeon's office due to a left thigh abscess.   Pt eating 100% of meals and drinking Nepro. Per chart, pt weight stable pta but presents ~20lbs above her UBW. Pt s/p I & D and VAC placement. Pt ordered for Ocuvite and Nepro. Per MD note, pt to discharge today.    Medications reviewed and include: allopurinol, colace, ferrous sulfate, insulin, ocuvite, protonix  Labs reviewed: BUN 105(H), creat 2.90(H), Ca 8.4(L) P 7.0(H)- 2/4 cbgs- 229, 322 x 24 hrs AIC 9.1(H)- 1/30  Nutrition-Focused physical exam completed. Findings are no fat depletion, no muscle depletion, and mild edema.   Diet Order:  Diet renal/carb modified with fluid restriction Diet-HS Snack? Nothing; Fluid restriction: 1200 mL Fluid; Room service appropriate? Yes; Fluid consistency: Thin Diet - low sodium heart healthy  EDUCATION NEEDS:   No education needs have been identified at this time  Skin:  Skin Assessment: (left thigh abscess )  Last BM:  2/6- type 6  Height:   Ht Readings from Last 1 Encounters:  08/12/17 5\' 8"  (1.727 m)    Weight:   Wt Readings from Last 1 Encounters:  08/19/17 (!) 326 lb  14.4 oz (148.3 kg)    Ideal Body Weight:  63.6 kg  BMI:  Body mass index is 49.7 kg/m.  Estimated Nutritional Needs:   Kcal:  2200-2500kcal/day   Protein:  >148g/day   Fluid:  >2.2L/day   Koleen Distance MS, RD, LDN Pager #[phone] After Hours Pager: [phone]

## 2017-08-19 NOTE — Progress Notes (Signed)
Discharge teaching given to patient, patient verbalized understanding and had no questions. Patient IV removed. Patient will be transported home by family. All patient belongings gathered prior to leaving. Patient placed on home wound vac prior to leaving.

## 2017-08-19 NOTE — Discharge Summary (Addendum)
[facility] NAME: [name]    MR#:  [id number]  DATE OF BIRTH:  [birth year]  DATE OF ADMISSION:  08/11/2017 ADMITTING PHYSICIAN: Demetrios Loll, MD  DATE OF DISCHARGE: 08/19/2017  PRIMARY CARE PHYSICIAN: [name], PA-C    ADMISSION DIAGNOSIS:  Lt thigh abscess  DISCHARGE DIAGNOSIS:   Left thigh abscess status post I and D now has wound VAC placement Postop anemia status post blood transfusion Acute on chronic renal failure stage III SECONDARY DIAGNOSIS:   Past Medical History:  Diagnosis Date  . Aortic stenosis    Echo 8/18: mean 13, peak 28, LVOT/AV mean velocity 0.51  . Asthma    As a child   . Bronchitis   . CAD (coronary artery disease)    a. 09/2016: 50% Ost 1st Mrg stenosis, 50% 2nd Mrg stenosis, 20% Mid-Cx, 95% Prox LAD, 40% mid-LAD, and 10% dist-LAD stenosis. Staged PCI with DES to Prox-LAD.   [name] Chronic combined systolic and diastolic CHF (congestive heart failure) ([place]) 2011   echo 2/18: EF 55-60, normal wall motion, grade 2 diastolic dysfunction, trivial AI // echo 3/18: Septal and apical HK, EF 45-50, normal wall motion, trivial AI, mild LAE, PASP 38 // echo 8/18: EF 60-65, normal wall motion, grade 1 diastolic dysfunction, calcified aortic valve leaflets, mild aortic stenosis (mean 13, peak 28, LVOT/AV mean velocity 0.51), mild AI, moderate MAC, mild LAE, trivial TR   . Complication of anesthesia   . Diabetes mellitus Dx 1989  . Hepatitis C Dx 2013  . Hypertension Dx 1989  . Obesity   . Pancreatitis 2013  . Refusal of blood transfusions as patient is Jehovah's Witness   . Tendinitis   . Ulcer 2010    HOSPITAL COURSE:   62 year old female with past medical history of hepatitis C, diabetes, chronic kidney disease stage IV, history of coronary artery disease, history of chronic diastolic CHF who presented to the hospital directly from the surgeon's office due to a left thigh abscess.  1.left  thigh abscess -status post incision and drainage and wound VAC placement by general surgery POD #2 -s/p  changing the wound VAC in the OR on 08/14/17 -Wound VAC replaced  by Dr. Bary Castilla at bedside.  Wound looks clean and healthy.   -DC empiric vancomycin, Zosyn--Dr. Bary Castilla okay with it -hgb 10.2--8.8--8.4--7.5--1 unit BT--7.6  2. Acute on chronic kidney injury stage III/4 with hyperkalemia -patient's baseline creatinine is around 2.0, currently creatinine is 2.6.--3.37--3.9--3.58--3.23--3.2--2.9 -resume home lasix QOD -Renal dose meds, avoid nephrotoxins. -tele monitor--d/c since pt in NSR -K  6.2--treated-- 4.6  3. History of coronary artery disease- no acute chest pain. - cont. Coreg, Brillinta, Statin.   4. DM Type II  - continue Lantus, NovoLog with meals and sliding scale insulin coverage. - BS stable.   5. Hyperlipidemia-continue atorvastatin.  6. diabetic neuropathy-continue gabapentin.  7. Essential hypertension-continue carvedilol, Norvasc, hydralazine, Imdur  8. GERD - cont. Protonix.   PT to see pt --- ambulating in the hall   WOUND [facility] DRESSING instructions:per Dr Bary Castilla 3 times a week change.  38mHg negative pressure.  Black foam.  Next change Friday or Saturday.  Match foam to wound size.  "Do not pack" which will prevent wound contractured  CONSULTS OBTAINED:  Treatment Team:  BRobert Bellow MD  DRUG ALLERGIES:   Allergies  Allergen Reactions  . Shellfish Allergy Anaphylaxis and Swelling  . Diazepam Other (See Comments)    "felt  like out of body experience"  . Morphine And Related Itching    DISCHARGE MEDICATIONS:   Allergies as of 08/19/2017      Reactions   Shellfish Allergy Anaphylaxis, Swelling   Diazepam Other (See Comments)   "felt like out of body experience"   Morphine And Related Itching      Medication List    STOP taking these medications   acetaminophen-codeine 300-30 MG tablet Commonly known as:  TYLENOL #3    doxycycline 100 MG tablet Commonly known as:  VIBRA-TABS   Exenatide ER 2 MG/0.85ML Auij Commonly known as:  BYDUREON BCISE   HYDROcodone-acetaminophen 5-325 MG tablet Commonly known as:  NORCO/VICODIN   levofloxacin 750 MG tablet Commonly known as:  LEVAQUIN   PEDIALYTE Soln     TAKE these medications   ACCU-CHEK AVIVA device Use as instructed daily.   accu-chek softclix lancets Use as instructed daily.   albuterol 108 (90 Base) MCG/ACT inhaler Commonly known as:  VENTOLIN HFA Inhale 2 puffs into the lungs every 4 (four) hours as needed for wheezing or shortness of breath.   albuterol (2.5 MG/3ML) 0.083% nebulizer solution Commonly known as:  PROVENTIL Take 3 mLs (2.5 mg total) by nebulization every 6 (six) hours as needed for wheezing or shortness of breath.   allopurinol 100 MG tablet Commonly known as:  ZYLOPRIM Take 1 tablet (100 mg total) by mouth daily.   amLODipine 10 MG tablet Commonly known as:  NORVASC Take 1 tablet (10 mg total) by mouth daily.   aspirin 81 MG EC tablet Take 1 tablet (81 mg total) by mouth daily.   atorvastatin 80 MG tablet Commonly known as:  LIPITOR Take 1 tablet (80 mg total) by mouth daily at 6 PM.   carvedilol 25 MG tablet Commonly known as:  COREG TAKE 1 TABLET BY MOUTH 2 TIMES DAILY WITH A MEAL.   docusate sodium 100 MG capsule Commonly known as:  COLACE Take 1 capsule (100 mg total) every 12 (twelve) hours by mouth.   ferrous sulfate 325 (65 FE) MG tablet Take 1 tablet (325 mg total) by mouth 2 (two) times daily with a meal.   fluticasone 50 MCG/ACT nasal spray Commonly known as:  FLONASE Place 2 sprays into both nostrils daily as needed for allergies or rhinitis.   Fluticasone-Salmeterol 250-50 MCG/DOSE Aepb Commonly known as:  ADVAIR DISKUS Inhale 1 puff into the lungs 2 (two) times daily. What changed:    when to take this  reasons to take this   furosemide 40 MG tablet Commonly known as:  LASIX Take 1  tablet (40 mg total) by mouth every other day. What changed:  when to take this   gabapentin 100 MG capsule Commonly known as:  NEURONTIN Take 1 capsule (100 mg total) by mouth 3 (three) times daily. What changed:    how much to take  how to take this  when to take this  additional instructions   glucose blood test strip Commonly known as:  TRUE METRIX BLOOD GLUCOSE TEST Use as instructed   glucose blood test strip Commonly known as:  ACCU-CHEK AVIVA Use as instructed daily   hydrALAZINE 50 MG tablet Commonly known as:  APRESOLINE Take 1 tablet (50 mg total) by mouth 3 (three) times daily.   hydrOXYzine 50 MG tablet Commonly known as:  ATARAX/VISTARIL Take 1 tablet (50 mg total) by mouth 3 (three) times daily as needed for itching.   insulin aspart 100 UNIT/ML FlexPen Commonly known as:  NOVOLOG Inject 15 Units into the skin 3 (three) times daily with meals. What changed:    how much to take  how to take this  when to take this  additional instructions   Insulin Glargine 100 UNIT/ML Solostar Pen Commonly known as:  LANTUS SOLOSTAR Inject 48 Units into the skin 2 (two) times daily. What changed:  how much to take   isosorbide mononitrate 60 MG 24 hr tablet Commonly known as:  IMDUR Take 1 tablet (60 mg total) by mouth daily.   ketotifen 0.025 % ophthalmic solution Commonly known as:  ZADITOR Place 1 drop into both eyes daily as needed (for dry eyes).   MITIGARE 0.6 MG Caps Generic drug:  Colchicine Take 1 capsule by mouth daily.   nitroGLYCERIN 0.4 MG SL tablet Commonly known as:  NITROSTAT Place 1 tablet (0.4 mg total) under the tongue every 5 (five) minutes x 3 doses as needed for chest pain.   omeprazole 20 MG capsule Commonly known as:  PRILOSEC Take 1 capsule (20 mg total) by mouth daily.   oxyCODONE-acetaminophen 5-325 MG tablet Commonly known as:  PERCOCET/ROXICET Take 1 tablet by mouth every 6 (six) hours as needed for severe pain.    pregabalin 100 MG capsule Commonly known as:  LYRICA Take 1 capsule (100 mg total) by mouth 3 (three) times daily.   ticagrelor 90 MG Tabs tablet Commonly known as:  BRILINTA Take 1 tablet (90 mg total) by mouth 2 (two) times daily.   valACYclovir 500 MG tablet Commonly known as:  VALTREX Take 1 tablet (500 mg total) by mouth daily.       If you experience worsening of your admission symptoms, develop shortness of breath, life threatening emergency, suicidal or homicidal thoughts you must seek medical attention immediately by calling 911 or calling your MD immediately  if symptoms less severe.  You Must read complete instructions/literature along with all the possible adverse reactions/side effects for all the Medicines you take and that have been prescribed to you. Take any new Medicines after you have completely understood and accept all the possible adverse reactions/side effects.   Please note  You were cared for by a hospitalist during your hospital stay. If you have any questions about your discharge medications or the care you received while you were in the hospital after you are discharged, you can call the unit and asked to speak with the hospitalist on call if the hospitalist that took care of you is not available. Once you are discharged, your primary care physician will handle any further medical issues. Please note that NO REFILLS for any discharge medications will be authorized once you are discharged, as it is imperative that you return to your primary care physician (or establish a relationship with a primary care physician if you do not have one) for your aftercare needs so that they can reassess your need for medications and monitor your lab values. Today   SUBJECTIVE   Doing ok  VITAL SIGNS:  Blood pressure (!) 149/64, pulse 82, temperature 98 F (36.7 C), temperature source Oral, resp. rate 20, height _0  (1.727 m), weight (!) 148.3 kg (326 lb 14.4 oz), SpO2 100  %.  I/O:    Intake/Output Summary (Last 24 hours) at 08/19/2017 1706 Last data filed at 08/19/2017 1543 Gross per 24 hour  Intake 960 ml  Output 1203 ml  Net -243 ml    PHYSICAL EXAMINATION:  GENERAL:  62 y.o.-year-old patient lying in the bed with  no acute distress. obese EYES: Pupils equal, round, reactive to light and accommodation. No scleral icterus. Extraocular muscles intact.  HEENT: Head atraumatic, normocephalic. Oropharynx and nasopharynx clear.  NECK:  Supple, no jugular venous distention. No thyroid enlargement, no tenderness.  LUNGS: Normal breath sounds bilaterally, no wheezing, rales,rhonchi or crepitation. No use of accessory muscles of respiration.  CARDIOVASCULAR: S1, S2 normal. No murmurs, rubs, or gallops.  ABDOMEN: Soft, non-tender, non-distended. Bowel sounds present. No organomegaly or mass.  EXTREMITIES: No pedal edema, cyanosis, or clubbing. Left thigh wound vac+ NEUROLOGIC: Cranial nerves II through XII are intact. Muscle strength 5/5 in all extremities. Sensation intact. Gait not checked.  PSYCHIATRIC: The patient is alert and oriented x 3.  SKIN: No obvious rash, lesion, or ulcer.   DATA REVIEW:   CBC  Recent Labs  Lab 08/16/17 0501 08/16/17 1501  WBC 9.8  --   HGB 7.5* 7.6*  HCT 23.0* 23.4*  PLT 245  --     Chemistries  Recent Labs  Lab 08/19/17 0502  NA 138  K 4.6  CL 109  CO2 17*  GLUCOSE 322*  BUN 105*  CREATININE 2.90*  CALCIUM 8.4*    Microbiology Results   Recent Results (from the past 240 hour(s))  Surgical pcr screen     Status: None   Collection Time: 08/12/17  1:31 AM  Result Value Ref Range Status   MRSA, PCR NEGATIVE NEGATIVE Final   Staphylococcus aureus NEGATIVE NEGATIVE Final    Comment: (NOTE) The Xpert SA Assay (FDA approved for NASAL specimens in patients 80 years of age and older), is one component of a comprehensive surveillance program. It is not intended to diagnose infection nor to guide or monitor  treatment. Performed at [facility], [address] [place], [place] [address]   Aerobic/Anaerobic Culture (surgical/deep wound)     Status: None   Collection Time: 08/12/17 12:34 PM  Result Value Ref Range Status   Specimen Description   Final    ABSCESS LEFT THIGH Performed at [facility], [place] [address] [place], [place] [address]    Special Requests   Final    NONE Performed at [facility], [place], [place] [address]    Gram Stain   Final    FEW WBC PRESENT,BOTH PMN AND MONONUCLEAR FEW GRAM NEGATIVE RODS    Culture   Final    MODERATE PREVOTELLA ORIS BETA LACTAMASE POSITIVE Performed at [facility], [place] [address] [place], [place] [address]    Report Status 08/17/2017 FINAL  Final    RADIOLOGY:  No results found.   Management plans discussed with the patient, family and they are in agreement.  CODE STATUS:     Code Status Orders  (From admission, onward)        Start     Ordered   08/15/17 2307  Full code  Continuous     08/15/17 2306    Code Status History    Date Active Date Inactive Code Status Order ID Comments User Context   08/11/2017 13:50 08/15/2017 23:06 Full Code [id number]  Demetrios Loll, MD Inpatient   06/02/2017 02:34 06/06/2017 15:36 Full Code [id number]  Caren Griffins, MD Inpatient   04/28/2017 05:16 05/02/2017 16:24 Full Code [id number]  Rise Patience, MD ED   03/17/2017 12:22 03/19/2017 20:30 Full Code [id number]  Liliane Shi, PA-C ED   12/13/2016 03:45 12/14/2016 16:47 Full Code [id number]  Reubin Milan, MD  Inpatient   09/13/2016 11:49 09/19/2016 14:58 Full Code [id number]  Flossie Dibble, MD ED   09/04/2016 01:37 09/05/2016 18:01 Full Code [id number]  Etta Quill, DO ED   08/21/2016 18:46 08/22/2016 17:33 Full Code [id number]  Thurnell Lose, MD Inpatient   03/18/2015 09:39 03/19/2015 18:02 Full Code [id number]  Dellia Nims, MD Inpatient   02/15/2015 17:00 02/18/2015 18:17 Full Code  [id number]  Francesca Oman, DO Inpatient   10/02/2014 15:24 10/04/2014 16:49 Full Code [id number]  Samella Parr, NP Inpatient   03/25/2012 05:31 04/03/2012 19:31 Full Code [id number]  Maudry Diego, RN Inpatient      TOTAL TIME TAKING CARE OF THIS PATIENT: *40* minutes.    [name] M.D on 08/19/2017 at 5:06 PM  Between 7am to 6pm - Pager - [phone] After 6pm go to [email or web address] - password [email or web address] [facility]  Office  [phone]  CC: Primary care physician; [name], PA-C

## 2017-08-19 NOTE — Clinical Social Work Note (Signed)
After several days of speaking with patient regarding snf for wound vac care and patient stating she definitely wanted to go, patient is now telling CSW this morning that she is going to decline STR and wishes to go home with home health. RN CM is aware. Shela Leff MSW,LCSW [phone]

## 2017-08-20 ENCOUNTER — Ambulatory Visit: Payer: Medicaid Other | Admitting: Physician Assistant

## 2017-08-20 ENCOUNTER — Telehealth: Payer: Self-pay | Admitting: *Deleted

## 2017-08-20 ENCOUNTER — Telehealth: Payer: Self-pay

## 2017-08-20 NOTE — Telephone Encounter (Signed)
[facility] for home care  and states she needs orders send over for 3 times a week wound vac changes and the machine is set for 80 and the order was for 75.  Her phone number is [phone] Fax [phone]

## 2017-08-20 NOTE — Telephone Encounter (Signed)
Transition Care Management Follow-Up Telephone Call   Date discharged and where: [facility] on 08/19/17.  How have you been since you were released from the hospital? Doing ok, resting. Wound is healing. Pain is about the same with moving but none when resting.   Any patient concerns? None  Items Reviewed:   Meds: verified  Allergies: verified  Dietary Changes Reviewed: N/A  Functional Questionnaire:  Independent-I Dependent-D  ADLs:   Dressing- I    Eating- I   Maintaining continence- I   Transferring- I   Transportation- I   Meal Prep- I   Managing Meds- I   Confirmed importance and Date/Time of follow-up visits scheduled: 08/30/17 @ 11:00 AM   Confirmed with patient if condition worsens to call PCP or go to the Emergency Dept. Patient was given office number and encouraged to call back with questions or concerns: YES

## 2017-08-24 ENCOUNTER — Telehealth: Payer: Self-pay | Admitting: *Deleted

## 2017-08-24 NOTE — Telephone Encounter (Signed)
Sonja with [facility] called asking if they can use gauze with the wound vac instead of the black foam. They said that the wound is odd shaped and deep that it hurts her when packing with the black foam (they have tried cutting it different ways). She had the wound nurse come and assess the wound as well and her suggestion would be to saturate a guaze with sterile saline and pack in the wound so not pockets will form. I told her I would let Dr Bary Castilla know and get back with her. Thanks Phone # is [phone].

## 2017-08-24 NOTE — Telephone Encounter (Signed)
Notified as instructed. Appreciates return call.

## 2017-08-24 NOTE — Telephone Encounter (Signed)
1cm thick black sponge cut to slightly smaller than the shape of the skin defect. Do not pack the deeper crevices with the sponge. OK to place one or two saline dampened 2x2 sponges under black Wound vac sponge.

## 2017-08-26 ENCOUNTER — Telehealth: Payer: Self-pay | Admitting: *Deleted

## 2017-08-26 NOTE — Telephone Encounter (Signed)
[name] with Physical therapy called to let us know that the frequency of patient therapy would be 1 wk 1 then 2 wk 1 and then follow up the next week for reevaluation.

## 2017-08-27 ENCOUNTER — Telehealth: Payer: Self-pay

## 2017-08-27 NOTE — Telephone Encounter (Signed)
[name] from Transition care Number phone number: [phone] calling to update [place] about [name]. Patient is not retaining much about her medical problems. She reports that patient states she feels overwhelmed by her health. Nurse reports that patient was advised to bring her medication. Patient seems like she is not aware of Diabetes, asthma,congestive heart failure. Patient reported to Nurse she doesn't know what is going on. Nurse is asking if we can have a plan for her and write it down [facility]erral to [facility] can deliver Checking her weight everyday. How often she needs to take her Gabapentin-because patient is taking 2 capsules 3 times a day. Podiatry-Black nail Patient also ordered a wight loss medication-doesn't know the name.  Patient was advised to bring all medicines with her.  Nurse states she went to visit her once and having conversation with her on the phone but patient doesn't remember.

## 2017-08-30 ENCOUNTER — Inpatient Hospital Stay: Payer: Self-pay | Admitting: Physician Assistant

## 2017-08-30 MED FILL — CARVEDILOL 25 MG TABLET: 25 | 30 days supply | Qty: 60 | Fill #5

## 2017-08-30 MED FILL — VALACYCLOVIR HCL 500 MG TAB: 500 | 30 days supply | Qty: 30 | Fill #5

## 2017-08-30 NOTE — Progress Notes (Deleted)
Patient: [name] Female    DOB: 03-31-1956   62 y.o.   MRN: [id number] Visit Date: 08/30/2017  Today's Provider: [name], PA-C   No chief complaint on file.  Subjective:    HPI   Follow up Hospitalization  Patient was admitted to [facility] on 08/11/17 and discharged on 08/19/17 She was treated for Lt thigh abscess status post I and D now has wound VAC placement. Treatment for this included  Telephone follow up was done on 08/20/17 She reports {DESC; EXCELLENT/GOOD/FAIR:19992} compliance with treatment. She reports this condition is {improved/worse/unchanged:3041574}.  ------------------------------------------------------------------------------------        Allergies  Allergen Reactions  . Shellfish Allergy Anaphylaxis and Swelling  . Diazepam Other (See Comments)    "felt like out of body experience"  . Morphine And Related Itching     Current Outpatient Medications:  .  albuterol (PROVENTIL) (2.5 MG/3ML) 0.083% nebulizer solution, Take 3 mLs (2.5 mg total) by nebulization every 6 (six) hours as needed for wheezing or shortness of breath., Disp: 150 mL, Rfl: 1 .  albuterol (VENTOLIN HFA) 108 (90 Base) MCG/ACT inhaler, Inhale 2 puffs into the lungs every 4 (four) hours as needed for wheezing or shortness of breath., Disp: 54 g, Rfl: 3 .  allopurinol (ZYLOPRIM) 100 MG tablet, Take 1 tablet (100 mg total) by mouth daily., Disp: 30 tablet, Rfl: 6 .  amLODipine (NORVASC) 10 MG tablet, Take 1 tablet (10 mg total) by mouth daily., Disp: 90 tablet, Rfl: 1 .  aspirin EC 81 MG EC tablet, Take 1 tablet (81 mg total) by mouth daily., Disp: , Rfl:  .  atorvastatin (LIPITOR) 80 MG tablet, Take 1 tablet (80 mg total) by mouth daily at 6 PM., Disp: 90 tablet, Rfl: 3 .  Blood Glucose Monitoring Suppl (ACCU-CHEK AVIVA) device, Use as instructed daily., Disp: 1 each, Rfl: 0 .  carvedilol (COREG) 25 MG tablet, TAKE 1 TABLET BY MOUTH 2 TIMES DAILY WITH A MEAL., Disp:  180 tablet, Rfl: 3 .  docusate sodium (COLACE) 100 MG capsule, Take 1 capsule (100 mg total) every 12 (twelve) hours by mouth. (Patient not taking: Reported on 08/20/2017), Disp: 30 capsule, Rfl: 0 .  ferrous sulfate 325 (65 FE) MG tablet, Take 1 tablet (325 mg total) by mouth 2 (two) times daily with a meal., Disp: 180 tablet, Rfl: 1 .  fluticasone (FLONASE) 50 MCG/ACT nasal spray, Place 2 sprays into both nostrils daily as needed for allergies or rhinitis., Disp: 16 g, Rfl: 5 .  Fluticasone-Salmeterol (ADVAIR DISKUS) 250-50 MCG/DOSE AEPB, Inhale 1 puff into the lungs 2 (two) times daily. (Patient taking differently: Inhale 1 puff into the lungs 2 (two) times daily as needed (shortness of breath). ), Disp: 60 each, Rfl: 3 .  furosemide (LASIX) 40 MG tablet, Take 1 tablet (40 mg total) by mouth every other day., Disp: 30 tablet, Rfl: 1 .  gabapentin (NEURONTIN) 100 MG capsule, Take 1 capsule (100 mg total) by mouth 3 (three) times daily., Disp: 90 capsule, Rfl: 1 .  glucose blood (ACCU-CHEK AVIVA) test strip, Use as instructed daily, Disp: 100 each, Rfl: 12 .  glucose blood (TRUE METRIX BLOOD GLUCOSE TEST) test strip, Use as instructed (Patient not taking: Reported on 08/20/2017), Disp: 100 each, Rfl: 12 .  hydrALAZINE (APRESOLINE) 50 MG tablet, Take 1 tablet (50 mg total) by mouth 3 (three) times daily., Disp: 270 tablet, Rfl: 3 .  hydrOXYzine (ATARAX/VISTARIL) 50 MG tablet, Take 1  tablet (50 mg total) by mouth 3 (three) times daily as needed for itching., Disp: 90 tablet, Rfl: 1 .  insulin aspart (NOVOLOG) 100 UNIT/ML FlexPen, Inject 15 Units into the skin 3 (three) times daily with meals., Disp: 15 mL, Rfl: 3 .  Insulin Glargine (LANTUS SOLOSTAR) 100 UNIT/ML Solostar Pen, Inject 48 Units into the skin 2 (two) times daily., Disp: 24 mL, Rfl: 2 .  isosorbide mononitrate (IMDUR) 60 MG 24 hr tablet, Take 1 tablet (60 mg total) by mouth daily., Disp: 30 tablet, Rfl: 6 .  ketotifen (ZADITOR) 0.025 %  ophthalmic solution, Place 1 drop into both eyes daily as needed (for dry eyes)., Disp: 10 mL, Rfl: 2 .  Lancet Devices (ACCU-CHEK SOFTCLIX) lancets, Use as instructed daily., Disp: 1 each, Rfl: 5 .  MITIGARE 0.6 MG CAPS, Take 1 capsule by mouth daily., Disp: 90 capsule, Rfl: 0 .  nitroGLYCERIN (NITROSTAT) 0.4 MG SL tablet, Place 1 tablet (0.4 mg total) under the tongue every 5 (five) minutes x 3 doses as needed for chest pain., Disp: 25 tablet, Rfl: 12 .  omeprazole (PRILOSEC) 20 MG capsule, Take 1 capsule (20 mg total) by mouth daily., Disp: 90 capsule, Rfl: 1 .  oxyCODONE-acetaminophen (PERCOCET/ROXICET) 5-325 MG tablet, Take 1 tablet by mouth every 6 (six) hours as needed for severe pain., Disp: 20 tablet, Rfl: 0 .  pregabalin (LYRICA) 100 MG capsule, Take 1 capsule (100 mg total) by mouth 3 (three) times daily., Disp: 90 capsule, Rfl: 1 .  ticagrelor (BRILINTA) 90 MG TABS tablet, Take 1 tablet (90 mg total) by mouth 2 (two) times daily., Disp: 180 tablet, Rfl: 2 .  valACYclovir (VALTREX) 500 MG tablet, Take 1 tablet (500 mg total) by mouth daily., Disp: 90 tablet, Rfl: 3  Review of Systems  Social History   Tobacco Use  . Smoking status: Former Smoker    Last attempt to quit: 10/25/1980    Years since quitting: 36.8  . Smokeless tobacco: Never Used  . Tobacco comment: quit smoking in 1982  Substance Use Topics  . Alcohol use: Yes    Comment: 3 times in last year   Objective:   There were no vitals taken for this visit.   Physical Exam      Assessment & Plan:           [name], PA-C  [facility]

## 2017-08-31 ENCOUNTER — Telehealth: Payer: Self-pay

## 2017-08-31 NOTE — Telephone Encounter (Signed)
Ben called from [employer] and states that he faxed over a form to our office yesterday in regards to patients medication and wanted to make sure that her meds had been reconciled. He request call back from nurse to verify. KW

## 2017-09-01 ENCOUNTER — Ambulatory Visit (INDEPENDENT_AMBULATORY_CARE_PROVIDER_SITE_OTHER): Payer: Medicaid Other | Admitting: Physician Assistant

## 2017-09-01 ENCOUNTER — Encounter: Payer: Self-pay | Admitting: Physician Assistant

## 2017-09-01 VITALS — BP 150/78 | HR 78 | Temp 97.8°F | Resp 20 | Wt 300.0 lb

## 2017-09-01 DIAGNOSIS — G629 Polyneuropathy, unspecified: Secondary | ICD-10-CM

## 2017-09-01 DIAGNOSIS — L02419 Cutaneous abscess of limb, unspecified: Secondary | ICD-10-CM

## 2017-09-01 DIAGNOSIS — E1121 Type 2 diabetes mellitus with diabetic nephropathy: Secondary | ICD-10-CM | POA: Diagnosis not present

## 2017-09-01 DIAGNOSIS — J4530 Mild persistent asthma, uncomplicated: Secondary | ICD-10-CM

## 2017-09-01 DIAGNOSIS — Z9189 Other specified personal risk factors, not elsewhere classified: Secondary | ICD-10-CM

## 2017-09-01 DIAGNOSIS — M1A39X0 Chronic gout due to renal impairment, multiple sites, without tophus (tophi): Secondary | ICD-10-CM | POA: Diagnosis not present

## 2017-09-01 DIAGNOSIS — E1165 Type 2 diabetes mellitus with hyperglycemia: Secondary | ICD-10-CM | POA: Diagnosis not present

## 2017-09-01 DIAGNOSIS — E559 Vitamin D deficiency, unspecified: Secondary | ICD-10-CM | POA: Diagnosis not present

## 2017-09-01 DIAGNOSIS — Z9109 Other allergy status, other than to drugs and biological substances: Secondary | ICD-10-CM

## 2017-09-01 DIAGNOSIS — N179 Acute kidney failure, unspecified: Secondary | ICD-10-CM

## 2017-09-01 DIAGNOSIS — IMO0001 Reserved for inherently not codable concepts without codable children: Secondary | ICD-10-CM

## 2017-09-01 DIAGNOSIS — I5041 Acute combined systolic (congestive) and diastolic (congestive) heart failure: Secondary | ICD-10-CM | POA: Diagnosis not present

## 2017-09-01 DIAGNOSIS — Z794 Long term (current) use of insulin: Secondary | ICD-10-CM | POA: Diagnosis not present

## 2017-09-01 DIAGNOSIS — E1140 Type 2 diabetes mellitus with diabetic neuropathy, unspecified: Secondary | ICD-10-CM | POA: Diagnosis not present

## 2017-09-01 MED ORDER — VITAMIN D (ERGOCALCIFEROL) 1.25 MG (50000 UNIT) PO CAPS: 50000.0000 [IU] | ORAL_CAPSULE | ORAL | 1 refills | Status: DC

## 2017-09-01 MED ORDER — ALBUTEROL SULFATE HFA 108 (90 BASE) MCG/ACT IN AERS: 2.0000 | INHALATION_SPRAY | RESPIRATORY_TRACT | 3 refills | Status: DC | PRN

## 2017-09-01 MED ORDER — PREGABALIN 100 MG PO CAPS: 100.0000 mg | ORAL_CAPSULE | Freq: Three times a day (TID) | ORAL | 1 refills | Status: DC

## 2017-09-01 MED ORDER — EXENATIDE ER 2 MG/0.85ML ~~LOC~~ AUIJ: 2.0000 mg | AUTO-INJECTOR | SUBCUTANEOUS | 3 refills | Status: DC

## 2017-09-01 MED ORDER — FLUTICASONE-SALMETEROL 250-50 MCG/DOSE IN AEPB: 1.0000 | INHALATION_SPRAY | Freq: Two times a day (BID) | RESPIRATORY_TRACT | 3 refills | Status: DC

## 2017-09-01 MED ORDER — ALLOPURINOL 100 MG PO TABS: 100.0000 mg | ORAL_TABLET | Freq: Every day | ORAL | 3 refills | Status: DC

## 2017-09-01 MED ORDER — ALBUTEROL SULFATE (2.5 MG/3ML) 0.083% IN NEBU: 2.5000 mg | INHALATION_SOLUTION | Freq: Four times a day (QID) | RESPIRATORY_TRACT | 1 refills | Status: DC | PRN

## 2017-09-01 MED ORDER — KETOTIFEN FUMARATE 0.025 % OP SOLN: 1.0000 [drp] | Freq: Every day | OPHTHALMIC | 2 refills | Status: DC | PRN

## 2017-09-01 MED ORDER — FUROSEMIDE 40 MG PO TABS: 40.0000 mg | ORAL_TABLET | ORAL | 3 refills | Status: DC

## 2017-09-01 MED ORDER — OXYCODONE-ACETAMINOPHEN 5-325 MG PO TABS: 1.0000 | ORAL_TABLET | Freq: Four times a day (QID) | ORAL | 0 refills | Status: DC | PRN

## 2017-09-01 NOTE — Telephone Encounter (Signed)
We did received the form it will be filled out once patient comes in to her appointment.  Thanks,  -Joseline

## 2017-09-01 NOTE — Progress Notes (Signed)
Patient: [name] Female    DOB: Nov 24, 1955   62 y.o.   MRN: [id number] Visit Date: 09/01/2017  Today's Provider: [name], PA-C   Chief Complaint  Patient presents with  . Follow-up   Subjective:    HPI  Follow up hospital admission  Patient was sent to ER for left thigh abscess on 08/11/2017. Patient was admitted and discharged on 08/19/17 Transition of care call was done on 08/20/2017 She was treated for left thigh abscess I and D. Treatment for this included I and D, wound VAC. She reports excellent compliance with treatment. She reports this condition is Improved. Patient reports abscess is being cleaned three times a week by nurse from [place]. Patient reports abscess is still painful.  There had been a phone note from her care coordinator that patient was confused over medications, disease processes, and treatment goals. Patient denies and states the day the nurse called she was in so much pain she couldn't think straight and was just trying to get off the phone. Today when questioned she was able to answer about all her medications and disease issues. ------------------------------------------------------------------------------------      Allergies  Allergen Reactions  . Shellfish Allergy Anaphylaxis and Swelling  . Diazepam Other (See Comments)    "felt like out of body experience"  . Morphine And Related Itching     Current Outpatient Medications:  .  allopurinol (ZYLOPRIM) 100 MG tablet, Take 1 tablet (100 mg total) by mouth daily., Disp: 30 tablet, Rfl: 6 .  amLODipine (NORVASC) 10 MG tablet, Take 1 tablet (10 mg total) by mouth daily., Disp: 90 tablet, Rfl: 1 .  aspirin EC 81 MG EC tablet, Take 1 tablet (81 mg total) by mouth daily., Disp: , Rfl:  .  atorvastatin (LIPITOR) 80 MG tablet, Take 1 tablet (80 mg total) by mouth daily at 6 PM., Disp: 90 tablet, Rfl: 3 .  Blood Glucose Monitoring Suppl (ACCU-CHEK AVIVA) device, Use as  instructed daily., Disp: 1 each, Rfl: 0 .  carvedilol (COREG) 25 MG tablet, TAKE 1 TABLET BY MOUTH 2 TIMES DAILY WITH A MEAL., Disp: 180 tablet, Rfl: 3 .  ferrous sulfate 325 (65 FE) MG tablet, Take 1 tablet (325 mg total) by mouth 2 (two) times daily with a meal., Disp: 180 tablet, Rfl: 1 .  fluticasone (FLONASE) 50 MCG/ACT nasal spray, Place 2 sprays into both nostrils daily as needed for allergies or rhinitis., Disp: 16 g, Rfl: 5 .  Fluticasone-Salmeterol (ADVAIR DISKUS) 250-50 MCG/DOSE AEPB, Inhale 1 puff into the lungs 2 (two) times daily. (Patient taking differently: Inhale 1 puff into the lungs 2 (two) times daily as needed (shortness of breath). ), Disp: 60 each, Rfl: 3 .  furosemide (LASIX) 40 MG tablet, Take 1 tablet (40 mg total) by mouth every other day., Disp: 30 tablet, Rfl: 1 .  gabapentin (NEURONTIN) 100 MG capsule, Take 1 capsule (100 mg total) by mouth 3 (three) times daily., Disp: 90 capsule, Rfl: 1 .  glucose blood (ACCU-CHEK AVIVA) test strip, Use as instructed daily, Disp: 100 each, Rfl: 12 .  glucose blood (TRUE METRIX BLOOD GLUCOSE TEST) test strip, Use as instructed, Disp: 100 each, Rfl: 12 .  hydrALAZINE (APRESOLINE) 50 MG tablet, Take 1 tablet (50 mg total) by mouth 3 (three) times daily., Disp: 270 tablet, Rfl: 3 .  hydrOXYzine (ATARAX/VISTARIL) 50 MG tablet, Take 1 tablet (50 mg total) by mouth 3 (three) times daily as needed  for itching., Disp: 90 tablet, Rfl: 1 .  insulin aspart (NOVOLOG) 100 UNIT/ML FlexPen, Inject 15 Units into the skin 3 (three) times daily with meals., Disp: 15 mL, Rfl: 3 .  Insulin Glargine (LANTUS SOLOSTAR) 100 UNIT/ML Solostar Pen, Inject 48 Units into the skin 2 (two) times daily., Disp: 24 mL, Rfl: 2 .  isosorbide mononitrate (IMDUR) 60 MG 24 hr tablet, Take 1 tablet (60 mg total) by mouth daily., Disp: 30 tablet, Rfl: 6 .  ketotifen (ZADITOR) 0.025 % ophthalmic solution, Place 1 drop into both eyes daily as needed (for dry eyes)., Disp: 10 mL,  Rfl: 2 .  Lancet Devices (ACCU-CHEK SOFTCLIX) lancets, Use as instructed daily., Disp: 1 each, Rfl: 5 .  MITIGARE 0.6 MG CAPS, Take 1 capsule by mouth daily., Disp: 90 capsule, Rfl: 0 .  nitroGLYCERIN (NITROSTAT) 0.4 MG SL tablet, Place 1 tablet (0.4 mg total) under the tongue every 5 (five) minutes x 3 doses as needed for chest pain., Disp: 25 tablet, Rfl: 12 .  omeprazole (PRILOSEC) 20 MG capsule, Take 1 capsule (20 mg total) by mouth daily., Disp: 90 capsule, Rfl: 1 .  oxyCODONE-acetaminophen (PERCOCET/ROXICET) 5-325 MG tablet, Take 1 tablet by mouth every 6 (six) hours as needed for severe pain., Disp: 20 tablet, Rfl: 0 .  pregabalin (LYRICA) 100 MG capsule, Take 1 capsule (100 mg total) by mouth 3 (three) times daily., Disp: 90 capsule, Rfl: 1 .  ticagrelor (BRILINTA) 90 MG TABS tablet, Take 1 tablet (90 mg total) by mouth 2 (two) times daily., Disp: 180 tablet, Rfl: 2 .  valACYclovir (VALTREX) 500 MG tablet, Take 1 tablet (500 mg total) by mouth daily., Disp: 90 tablet, Rfl: 3 .  albuterol (PROVENTIL) (2.5 MG/3ML) 0.083% nebulizer solution, Take 3 mLs (2.5 mg total) by nebulization every 6 (six) hours as needed for wheezing or shortness of breath. (Patient not taking: Reported on 09/01/2017), Disp: 150 mL, Rfl: 1 .  albuterol (VENTOLIN HFA) 108 (90 Base) MCG/ACT inhaler, Inhale 2 puffs into the lungs every 4 (four) hours as needed for wheezing or shortness of breath. (Patient not taking: Reported on 09/01/2017), Disp: 54 g, Rfl: 3 .  docusate sodium (COLACE) 100 MG capsule, Take 1 capsule (100 mg total) every 12 (twelve) hours by mouth. (Patient not taking: Reported on 08/20/2017), Disp: 30 capsule, Rfl: 0  Review of Systems  Constitutional: Negative.   HENT: Negative.   Respiratory: Negative.   Cardiovascular: Negative.   Gastrointestinal: Negative.   Skin: Positive for wound.  Neurological: Negative.     Social History   Tobacco Use  . Smoking status: Former Smoker    Last attempt to  quit: 10/25/1980    Years since quitting: 36.8  . Smokeless tobacco: Never Used  . Tobacco comment: quit smoking in 1982  Substance Use Topics  . Alcohol use: Yes    Comment: 3 times in last year   Objective:   BP (!) 150/78 (BP Location: Left Wrist, Patient Position: Sitting, Cuff Size: Large)   Pulse 78   Temp 97.8 F (36.6 C) (Oral)   Resp 20   Wt 300 lb (136.1 kg)   SpO2 99%   BMI 45.61 kg/m  Vitals:   09/01/17 1601  BP: (!) 150/78  Pulse: 78  Resp: 20  Temp: 97.8 F (36.6 C)  TempSrc: Oral  SpO2: 99%  Weight: 300 lb (136.1 kg)     Physical Exam  Constitutional: She appears well-developed and well-nourished. No distress.  Neck: Normal range  of motion. Neck supple. No JVD present. No tracheal deviation present. No thyromegaly present.  Cardiovascular: Normal rate, regular rhythm and normal heart sounds. Exam reveals no gallop and no friction rub.  No murmur heard. Pulmonary/Chest: Effort normal and breath sounds normal. No respiratory distress. She has no wheezes. She has no rales.  Lymphadenopathy:    She has no cervical adenopathy.  Skin: She is not diaphoretic.     Vitals reviewed.      Assessment & Plan:     1. Transition of care performed with sharing of clinical summary Notes, testing, Op notes, and results reviewed from hospitalization from 08/11/2017 - 08/19/2017.  2. Abscess of leg Doing much better. Picture of wound looked like adequate healing is taking place. Still has some pain in the area secondary to the wound vac and will take a pain pill prior to wound vac changes. Rx refilled as below.  - oxyCODONE-acetaminophen (PERCOCET/ROXICET) 5-325 MG tablet; Take 1 tablet by mouth every 6 (six) hours as needed for severe pain.  Dispense: 20 tablet; Refill: 0 - CBC w/Diff/Platelet  3. Acute renal failure, unspecified acute renal failure type ([place]) Acute renal failure noted in the hospital secondary to infection and dehydration. Will check labs to make  sure returning to baseline. - Basic Metabolic Panel (BMET)  4. Mild persistent asthma without complication Stable. Meds reconciled and new Rx sent for medications she is out of or close to being out of. Patient understands treatment plan and when to use which inhalers.  - albuterol (PROVENTIL) (2.5 MG/3ML) 0.083% nebulizer solution; Take 3 mLs (2.5 mg total) by nebulization every 6 (six) hours as needed for wheezing or shortness of breath.  Dispense: 150 mL; Refill: 1 - albuterol (VENTOLIN HFA) 108 (90 Base) MCG/ACT inhaler; Inhale 2 puffs into the lungs every 4 (four) hours as needed for wheezing or shortness of breath.  Dispense: 54 g; Refill: 3 - Fluticasone-Salmeterol (ADVAIR DISKUS) 250-50 MCG/DOSE AEPB; Inhale 1 puff into the lungs 2 (two) times daily.  Dispense: 180 each; Refill: 3  5. Neuropathy Patient had been on Lyrica previosuly, was stopped due to cost and patient was started on Gabapentin and titrated up. Per patient she doesn't know why but pharmacy started giving her lyrica again so she has been taking both. She would like to stop the gabapentin as it causes increased drowsiness with taking both and she feels lyrica works better. Lyrica refilled as below.  - pregabalin (LYRICA) 100 MG capsule; Take 1 capsule (100 mg total) by mouth 3 (three) times daily.  Dispense: 270 capsule; Refill: 1  6. Vitamin D deficiency Known deficiency. Patient just needed refill.  - Vitamin D, Ergocalciferol, (DRISDOL) 50000 units CAPS capsule; Take 1 capsule (50,000 Units total) by mouth every 30 (thirty) days.  Dispense: 4 capsule; Refill: 1  7. Environmental allergies Stable. Diagnosis pulled for medication refill. Continue current medical treatment plan. - ketotifen (ZADITOR) 0.025 % ophthalmic solution; Place 1 drop into both eyes daily as needed (for dry eyes).  Dispense: 10 mL; Refill: 2  8. Chronic gout due to renal impairment of multiple sites without tophus Stable. Diagnosis pulled for  medication refill. Continue current medical treatment plan. - allopurinol (ZYLOPRIM) 100 MG tablet; Take 1 tablet (100 mg total) by mouth daily.  Dispense: 90 tablet; Refill: 3  9. Acute combined systolic and diastolic heart failure (HCC) Stable. Diagnosis pulled for medication refill. Continue current medical treatment plan. If renal function improving may consider to go to once daily  dosing again.  - furosemide (LASIX) 40 MG tablet; Take 1 tablet (40 mg total) by mouth every other day.  Dispense: 45 tablet; Refill: 3  10. Type 2 diabetes mellitus with diabetic nephropathy, with long-term current use of insulin (HCC) Previosuly on Byetta but Byetta no longer manufactured. Will change to [facility] as this is same medication and we know she tolerates well. Continue all other diabetic medications as well.  - Exenatide ER (BYDUREON BCISE) 2 MG/0.85ML AUIJ; Inject 2 mg into the skin once a week.  Dispense: 12 pen; Refill: 3 - Basic Metabolic Panel (BMET)  11. Poorly controlled type 2 diabetes mellitus with neuropathy (HCC) Continue Lantus 48 units BID, continue Novolog 15 units with meals with sliding scale as directed by her endocrinologist, and start BCise as noted above to replace Byetta.        [name], PA-C  [facility]

## 2017-09-01 NOTE — Telephone Encounter (Signed)
Patient has an appointment today. We will review her medication today and update medication.

## 2017-09-02 ENCOUNTER — Ambulatory Visit: Payer: Medicaid Other | Admitting: General Surgery

## 2017-09-02 LAB — BASIC METABOLIC PANEL
BUN/Creatinine Ratio: 19 (ref 9–23)
BUN: 40 mg/dL — ABNORMAL HIGH (ref 6–24)
CO2: 19 mmol/L — ABNORMAL LOW (ref 20–29)
Calcium: 9.3 mg/dL (ref 8.7–10.2)
Chloride: 106 mmol/L (ref 96–106)
Creatinine, Ser: 2.08 mg/dL — ABNORMAL HIGH (ref 0.57–1.00)
GFR calc Af Amer: 29 mL/min/{1.73_m2} — ABNORMAL LOW (ref >59)
GFR calc non Af Amer: 26 mL/min/{1.73_m2} — ABNORMAL LOW (ref >59)
Glucose: 264 mg/dL — ABNORMAL HIGH (ref 65–99)
Potassium: 4.7 mmol/L (ref 3.5–5.2)
Sodium: 141 mmol/L (ref 134–144)

## 2017-09-02 LAB — CBC WITH DIFFERENTIAL/PLATELET
Basophils Absolute: 0 10*3/uL (ref 0.0–0.2)
Basos: 0 %
EOS (ABSOLUTE): 0.3 10*3/uL (ref 0.0–0.4)
Eos: 6 %
Hematocrit: 29.2 % — ABNORMAL LOW (ref 34.0–46.6)
Hemoglobin: 9 g/dL — ABNORMAL LOW (ref 11.1–15.9)
Immature Grans (Abs): 0 10*3/uL (ref 0.0–0.1)
Immature Granulocytes: 0 %
Lymphocytes Absolute: 1.6 10*3/uL (ref 0.7–3.1)
Lymphs: 29 %
MCH: 26.6 pg (ref 26.6–33.0)
MCHC: 30.8 g/dL — ABNORMAL LOW (ref 31.5–35.7)
MCV: 86 fL (ref 79–97)
Monocytes Absolute: 0.4 10*3/uL (ref 0.1–0.9)
Monocytes: 6 %
Neutrophils Absolute: 3.4 10*3/uL (ref 1.4–7.0)
Neutrophils: 59 %
Platelets: 348 10*3/uL (ref 150–379)
RBC: 3.38 x10E6/uL — ABNORMAL LOW (ref 3.77–5.28)
RDW: 18.4 % — ABNORMAL HIGH (ref 12.3–15.4)
WBC: 5.7 10*3/uL (ref 3.4–10.8)

## 2017-09-03 ENCOUNTER — Encounter: Payer: Self-pay | Admitting: Physician Assistant

## 2017-09-03 ENCOUNTER — Telehealth: Payer: Self-pay | Admitting: Physician Assistant

## 2017-09-03 ENCOUNTER — Telehealth: Payer: Self-pay

## 2017-09-03 NOTE — Telephone Encounter (Signed)
-----   Message from [name], [place] sent at 09/03/2017  1:09 PM EST ----- Hemoglobin continues to improve and is up to 9.0 when had been down to 7.6 following surgery. Continue ferrous sulfate. Renal function continues to improve and is down to 2.08 now. Would like to give a few more weeks of hydration and letting your body heal. Will recheck in 2-3 weeks. If continuing to improve will then allow furosemide dosing daily. As for now continue every other day dosing. Liver enzymes are borderline elevated. I would recommend trying an OTC topical nail fungal solution first while we continue to allow your body to heal and labs normalize even more then we may consider an oral antifungal if no improvements are being made.

## 2017-09-03 NOTE — Telephone Encounter (Signed)
Patient advised as below.  

## 2017-09-03 NOTE — Telephone Encounter (Signed)
Suezanne Jacquet would like to talk to one of [place] regarding Ms. Arabie.  Would not elaborate on details.

## 2017-09-03 NOTE — Telephone Encounter (Signed)
Spoke with [place] and he just wanted to know the update on the form that was sent to [place]. [name] filled the form out and it will be faxed today.  Thanks,  -Samreet [place]

## 2017-09-04 LAB — HEPATIC FUNCTION PANEL
ALT: 33 IU/L — ABNORMAL HIGH (ref 0–32)
AST: 33 IU/L (ref 0–40)
Albumin: 4.1 g/dL (ref 3.5–5.5)
Alkaline Phosphatase: 252 IU/L — ABNORMAL HIGH (ref 39–117)
Bilirubin Total: <0.2 mg/dL (ref 0.0–1.2)
Bilirubin, Direct: 0.11 mg/dL (ref 0.00–0.40)
Total Protein: 7.1 g/dL (ref 6.0–8.5)

## 2017-09-04 LAB — SPECIMEN STATUS REPORT

## 2017-09-07 ENCOUNTER — Other Ambulatory Visit: Payer: Self-pay

## 2017-09-07 DIAGNOSIS — I2510 Atherosclerotic heart disease of native coronary artery without angina pectoris: Secondary | ICD-10-CM

## 2017-09-07 MED ORDER — TICAGRELOR 90 MG PO TABS: 90.0000 mg | ORAL_TABLET | Freq: Two times a day (BID) | ORAL | 3 refills | Status: DC

## 2017-09-08 ENCOUNTER — Telehealth: Payer: Self-pay | Admitting: Physician Assistant

## 2017-09-08 NOTE — Telephone Encounter (Signed)
Please Review.  Thanks,  -Joseline 

## 2017-09-08 NOTE — Telephone Encounter (Signed)
Spoke with [name] and advised as directed below.  Thanks,  -Mariah Gerstenberger

## 2017-09-08 NOTE — Telephone Encounter (Signed)
Caryl Pina from [facility] called stating that pt's eyes are puffy and swollen,pt also has a cough.No fever and her vitals are fine.She was outside some yesterday so may just be allergies.Her contact # is [phone]

## 2017-09-08 NOTE — Telephone Encounter (Signed)
LM for [name] TCB  Thanks,  -Derotha Fishbaugh

## 2017-09-08 NOTE — Telephone Encounter (Signed)
Most likely allergies. She can use OTC claritin and eye drops if needed

## 2017-09-09 ENCOUNTER — Telehealth: Payer: Self-pay | Admitting: Emergency Medicine

## 2017-09-09 ENCOUNTER — Telehealth: Payer: Self-pay | Admitting: Physician Assistant

## 2017-09-09 DIAGNOSIS — J0140 Acute pansinusitis, unspecified: Secondary | ICD-10-CM

## 2017-09-09 MED ORDER — AMOXICILLIN-POT CLAVULANATE 875-125 MG PO TABS: 1.0000 | ORAL_TABLET | Freq: Two times a day (BID) | ORAL | 0 refills | Status: DC

## 2017-09-09 NOTE — Telephone Encounter (Signed)
7 page, paperwork received through fax 09-09-17.

## 2017-09-09 NOTE — Telephone Encounter (Signed)
Pt reports that her eyes and face is swollen for 3 days and benadryl is not helping and she is very congested. She felt like she may have had a fever this morning but it is gone now. She reports that she is wheezing, but no shortness of breath, no chest pain. She is coughing up thick, yellow sputum. She wants to know if she you can call her something in. Told her that she will most likely need an appt but she wanted me to send back a message any ways. Please advise.    [facility] [employer]

## 2017-09-09 NOTE — Telephone Encounter (Signed)
Sent in augmentin

## 2017-09-10 ENCOUNTER — Ambulatory Visit
Admission: RE | Admit: 2017-09-10 | Discharge: 2017-09-10 | Disposition: A | Discharge status: Home / Self Care | Payer: Medicaid Other | Source: Ambulatory Visit | Attending: Nephrology | Admitting: Nephrology

## 2017-09-10 NOTE — Telephone Encounter (Signed)
Patient advised as below.  

## 2017-09-14 ENCOUNTER — Encounter: Payer: Self-pay | Admitting: Emergency Medicine

## 2017-09-14 ENCOUNTER — Emergency Department: Payer: Medicaid Other

## 2017-09-14 ENCOUNTER — Inpatient Hospital Stay
Admission: EM | Admit: 2017-09-14 | Discharge: 2017-09-20 | DRG: 291 | Disposition: A | Discharge status: Home Health | Payer: Medicaid Other | Attending: Internal Medicine | Admitting: Internal Medicine

## 2017-09-14 ENCOUNTER — Telehealth: Payer: Self-pay | Admitting: Physician Assistant

## 2017-09-14 ENCOUNTER — Other Ambulatory Visit: Payer: Self-pay

## 2017-09-14 ENCOUNTER — Ambulatory Visit: Payer: Medicaid Other | Admitting: General Surgery

## 2017-09-14 DIAGNOSIS — G9340 Encephalopathy, unspecified: Secondary | ICD-10-CM | POA: Diagnosis present

## 2017-09-14 DIAGNOSIS — Z833 Family history of diabetes mellitus: Secondary | ICD-10-CM | POA: Diagnosis not present

## 2017-09-14 DIAGNOSIS — E662 Morbid (severe) obesity with alveolar hypoventilation: Secondary | ICD-10-CM | POA: Diagnosis present

## 2017-09-14 DIAGNOSIS — J9690 Respiratory failure, unspecified, unspecified whether with hypoxia or hypercapnia: Secondary | ICD-10-CM

## 2017-09-14 DIAGNOSIS — Z7951 Long term (current) use of inhaled steroids: Secondary | ICD-10-CM | POA: Diagnosis not present

## 2017-09-14 DIAGNOSIS — Z6841 Body Mass Index (BMI) 40.0 and over, adult: Secondary | ICD-10-CM | POA: Diagnosis not present

## 2017-09-14 DIAGNOSIS — J810 Acute pulmonary edema: Secondary | ICD-10-CM

## 2017-09-14 DIAGNOSIS — J9600 Acute respiratory failure, unspecified whether with hypoxia or hypercapnia: Secondary | ICD-10-CM | POA: Diagnosis present

## 2017-09-14 DIAGNOSIS — D631 Anemia in chronic kidney disease: Secondary | ICD-10-CM | POA: Diagnosis present

## 2017-09-14 DIAGNOSIS — J9621 Acute and chronic respiratory failure with hypoxia: Secondary | ICD-10-CM | POA: Diagnosis present

## 2017-09-14 DIAGNOSIS — I2510 Atherosclerotic heart disease of native coronary artery without angina pectoris: Secondary | ICD-10-CM | POA: Diagnosis present

## 2017-09-14 DIAGNOSIS — J9601 Acute respiratory failure with hypoxia: Secondary | ICD-10-CM

## 2017-09-14 DIAGNOSIS — Z9119 Patient's noncompliance with other medical treatment and regimen: Secondary | ICD-10-CM | POA: Diagnosis not present

## 2017-09-14 DIAGNOSIS — I350 Nonrheumatic aortic (valve) stenosis: Secondary | ICD-10-CM | POA: Diagnosis present

## 2017-09-14 DIAGNOSIS — N184 Chronic kidney disease, stage 4 (severe): Secondary | ICD-10-CM

## 2017-09-14 DIAGNOSIS — Z91013 Allergy to seafood: Secondary | ICD-10-CM

## 2017-09-14 DIAGNOSIS — IMO0002 Reserved for concepts with insufficient information to code with codable children: Secondary | ICD-10-CM

## 2017-09-14 DIAGNOSIS — Z7982 Long term (current) use of aspirin: Secondary | ICD-10-CM | POA: Diagnosis not present

## 2017-09-14 DIAGNOSIS — R0600 Dyspnea, unspecified: Secondary | ICD-10-CM | POA: Diagnosis not present

## 2017-09-14 DIAGNOSIS — I5033 Acute on chronic diastolic (congestive) heart failure: Secondary | ICD-10-CM

## 2017-09-14 DIAGNOSIS — Z79899 Other long term (current) drug therapy: Secondary | ICD-10-CM | POA: Diagnosis not present

## 2017-09-14 DIAGNOSIS — E1122 Type 2 diabetes mellitus with diabetic chronic kidney disease: Secondary | ICD-10-CM | POA: Diagnosis present

## 2017-09-14 DIAGNOSIS — E118 Type 2 diabetes mellitus with unspecified complications: Secondary | ICD-10-CM

## 2017-09-14 DIAGNOSIS — Z794 Long term (current) use of insulin: Secondary | ICD-10-CM

## 2017-09-14 DIAGNOSIS — E1165 Type 2 diabetes mellitus with hyperglycemia: Secondary | ICD-10-CM

## 2017-09-14 DIAGNOSIS — Z955 Presence of coronary angioplasty implant and graft: Secondary | ICD-10-CM | POA: Diagnosis not present

## 2017-09-14 DIAGNOSIS — L02419 Cutaneous abscess of limb, unspecified: Secondary | ICD-10-CM

## 2017-09-14 DIAGNOSIS — I10 Essential (primary) hypertension: Secondary | ICD-10-CM

## 2017-09-14 DIAGNOSIS — I130 Hypertensive heart and chronic kidney disease with heart failure and stage 1 through stage 4 chronic kidney disease, or unspecified chronic kidney disease: Principal | ICD-10-CM | POA: Diagnosis present

## 2017-09-14 DIAGNOSIS — N179 Acute kidney failure, unspecified: Secondary | ICD-10-CM | POA: Diagnosis present

## 2017-09-14 DIAGNOSIS — I161 Hypertensive emergency: Secondary | ICD-10-CM | POA: Diagnosis present

## 2017-09-14 DIAGNOSIS — Z8249 Family history of ischemic heart disease and other diseases of the circulatory system: Secondary | ICD-10-CM

## 2017-09-14 DIAGNOSIS — Z87891 Personal history of nicotine dependence: Secondary | ICD-10-CM | POA: Diagnosis not present

## 2017-09-14 DIAGNOSIS — E785 Hyperlipidemia, unspecified: Secondary | ICD-10-CM | POA: Diagnosis present

## 2017-09-14 LAB — BASIC METABOLIC PANEL
Anion gap: 13 (ref 5–15)
BUN: 80 mg/dL — ABNORMAL HIGH (ref 6–20)
CO2: 17 mmol/L — ABNORMAL LOW (ref 22–32)
Calcium: 8 mg/dL — ABNORMAL LOW (ref 8.9–10.3)
Chloride: 109 mmol/L (ref 101–111)
Creatinine, Ser: 2.89 mg/dL — ABNORMAL HIGH (ref 0.44–1.00)
GFR calc Af Amer: 19 mL/min — ABNORMAL LOW (ref >60)
GFR calc non Af Amer: 17 mL/min — ABNORMAL LOW (ref >60)
Glucose, Bld: 245 mg/dL — ABNORMAL HIGH (ref 65–99)
Potassium: 4.4 mmol/L (ref 3.5–5.1)
Sodium: 139 mmol/L (ref 135–145)

## 2017-09-14 LAB — BLOOD GAS, ARTERIAL
Acid-base deficit: 6.1 mmol/L — ABNORMAL HIGH (ref 0.0–2.0)
Bicarbonate: 18.6 mmol/L — ABNORMAL LOW (ref 20.0–28.0)
Delivery systems: POSITIVE
Expiratory PAP: 6
FIO2: 35
Inspiratory PAP: 12
O2 Saturation: 97.2 %
Patient temperature: 37
pCO2 arterial: 33 mmHg (ref 32.0–48.0)
pH, Arterial: 7.36 (ref 7.350–7.450)
pO2, Arterial: 96 mmHg (ref 83.0–108.0)

## 2017-09-14 LAB — CBC WITH DIFFERENTIAL/PLATELET
Basophils Absolute: 0.1 10*3/uL (ref 0–0.1)
Basophils Absolute: 0 10*3/uL (ref 0–0.1)
Basophils Relative: 1 %
Basophils Relative: 0 %
Eosinophils Absolute: 0.2 10*3/uL (ref 0–0.7)
Eosinophils Absolute: 0 10*3/uL (ref 0–0.7)
Eosinophils Relative: 2 %
Eosinophils Relative: 0 %
HCT: 27.8 % — ABNORMAL LOW (ref 35.0–47.0)
HCT: 25.6 % — ABNORMAL LOW (ref 35.0–47.0)
Hemoglobin: 9.1 g/dL — ABNORMAL LOW (ref 12.0–16.0)
Hemoglobin: 8.3 g/dL — ABNORMAL LOW (ref 12.0–16.0)
Lymphocytes Relative: 11 %
Lymphocytes Relative: 6 %
Lymphs Abs: 1.1 10*3/uL (ref 1.0–3.6)
Lymphs Abs: 0.4 10*3/uL — ABNORMAL LOW (ref 1.0–3.6)
MCH: 28 pg (ref 26.0–34.0)
MCH: 27.7 pg (ref 26.0–34.0)
MCHC: 32.8 g/dL (ref 32.0–36.0)
MCHC: 32.3 g/dL (ref 32.0–36.0)
MCV: 85.4 fL (ref 80.0–100.0)
MCV: 85.6 fL (ref 80.0–100.0)
Monocytes Absolute: 1 10*3/uL — ABNORMAL HIGH (ref 0.2–0.9)
Monocytes Absolute: 0.2 10*3/uL (ref 0.2–0.9)
Monocytes Relative: 9 %
Monocytes Relative: 3 %
Neutro Abs: 8.3 10*3/uL — ABNORMAL HIGH (ref 1.4–6.5)
Neutro Abs: 6.8 10*3/uL — ABNORMAL HIGH (ref 1.4–6.5)
Neutrophils Relative %: 77 %
Neutrophils Relative %: 91 %
Platelets: 272 10*3/uL (ref 150–440)
Platelets: 207 10*3/uL (ref 150–440)
RBC: 3.26 MIL/uL — ABNORMAL LOW (ref 3.80–5.20)
RBC: 2.99 MIL/uL — ABNORMAL LOW (ref 3.80–5.20)
RDW: 18.9 % — ABNORMAL HIGH (ref 11.5–14.5)
RDW: 19.1 % — ABNORMAL HIGH (ref 11.5–14.5)
WBC: 10.7 10*3/uL (ref 3.6–11.0)
WBC: 7.4 10*3/uL (ref 3.6–11.0)

## 2017-09-14 LAB — GLUCOSE, CAPILLARY
Glucose-Capillary: 316 mg/dL — ABNORMAL HIGH (ref 65–99)
Glucose-Capillary: 341 mg/dL — ABNORMAL HIGH (ref 65–99)

## 2017-09-14 LAB — PROTIME-INR
INR: 0.98
Prothrombin Time: 12.9 seconds (ref 11.4–15.2)

## 2017-09-14 LAB — TROPONIN I
Troponin I: <0.03 ng/mL (ref <0.03)
Troponin I: <0.03 ng/mL (ref <0.03)
Troponin I: <0.03 ng/mL (ref <0.03)

## 2017-09-14 LAB — BRAIN NATRIURETIC PEPTIDE: B Natriuretic Peptide: 195 pg/mL — ABNORMAL HIGH (ref 0.0–100.0)

## 2017-09-14 LAB — HEPATIC FUNCTION PANEL
ALT: 52 U/L (ref 14–54)
AST: 32 U/L (ref 15–41)
Albumin: 3.7 g/dL (ref 3.5–5.0)
Alkaline Phosphatase: 291 U/L — ABNORMAL HIGH (ref 38–126)
Bilirubin, Direct: 0.1 mg/dL (ref 0.1–0.5)
Indirect Bilirubin: 0.6 mg/dL (ref 0.3–0.9)
Total Bilirubin: 0.7 mg/dL (ref 0.3–1.2)
Total Protein: 7.6 g/dL (ref 6.5–8.1)

## 2017-09-14 LAB — BLOOD GAS, VENOUS
pCO2, Ven: 38 mmHg — ABNORMAL LOW (ref 44.0–60.0)
pH, Ven: 7.31 (ref 7.250–7.430)
pO2, Ven: 46 mmHg — ABNORMAL HIGH (ref 32.0–45.0)

## 2017-09-14 LAB — FIBRIN DERIVATIVES D-DIMER (ARMC ONLY): Fibrin derivatives D-dimer (ARMC): 421.69 ng/mL (FEU) (ref 0.00–499.00)

## 2017-09-14 LAB — MRSA PCR SCREENING: MRSA by PCR: NEGATIVE

## 2017-09-14 MED ORDER — ALBUTEROL SULFATE (2.5 MG/3ML) 0.083% IN NEBU: 2.5000 mg | INHALATION_SOLUTION | Freq: Four times a day (QID) | RESPIRATORY_TRACT | Status: DC | PRN

## 2017-09-14 MED ORDER — INSULIN ASPART 100 UNIT/ML ~~LOC~~ SOLN
0.0000 [IU] | Freq: Every day | SUBCUTANEOUS | Status: DC
  Administered 2017-09-14: 4 [IU] via SUBCUTANEOUS
  Filled 2017-09-14: qty 1

## 2017-09-14 MED ORDER — PANTOPRAZOLE SODIUM 40 MG PO TBEC
40.0000 mg | DELAYED_RELEASE_TABLET | Freq: Every day | ORAL | Status: DC
  Administered 2017-09-14 – 2017-09-20 (×7): 40 mg via ORAL
  Filled 2017-09-14 (×7): qty 1

## 2017-09-14 MED ORDER — SODIUM CHLORIDE 0.9 % IV SOLN: 250.0000 mL | INTRAVENOUS | Status: DC | PRN

## 2017-09-14 MED ORDER — FUROSEMIDE 10 MG/ML IJ SOLN
80.0000 mg | Freq: Once | INTRAMUSCULAR | Status: AC
Start: 2017-09-14 — End: 2017-09-14
  Administered 2017-09-14: 80 mg via INTRAVENOUS
  Filled 2017-09-14: qty 8

## 2017-09-14 MED ORDER — EXENATIDE ER 2 MG/0.85ML ~~LOC~~ AUIJ: 2.0000 mg | AUTO-INJECTOR | SUBCUTANEOUS | Status: DC

## 2017-09-14 MED ORDER — COLCHICINE 0.6 MG PO TABS
0.6000 mg | ORAL_TABLET | Freq: Every day | ORAL | Status: DC
  Administered 2017-09-15 – 2017-09-20 (×6): 0.6 mg via ORAL
  Filled 2017-09-14 (×6): qty 1

## 2017-09-14 MED ORDER — HYDRALAZINE HCL 50 MG PO TABS
50.0000 mg | ORAL_TABLET | Freq: Three times a day (TID) | ORAL | Status: DC
  Administered 2017-09-14 – 2017-09-15 (×3): 50 mg via ORAL
  Filled 2017-09-14 (×3): qty 1

## 2017-09-14 MED ORDER — TICAGRELOR 90 MG PO TABS
90.0000 mg | ORAL_TABLET | Freq: Two times a day (BID) | ORAL | Status: DC
  Administered 2017-09-14 – 2017-09-20 (×12): 90 mg via ORAL
  Filled 2017-09-14 (×13): qty 1

## 2017-09-14 MED ORDER — METHYLPREDNISOLONE SODIUM SUCC 125 MG IJ SOLR
125.0000 mg | Freq: Once | INTRAMUSCULAR | Status: AC
  Administered 2017-09-14: 125 mg via INTRAVENOUS
  Filled 2017-09-14: qty 2

## 2017-09-14 MED ORDER — HYDROXYZINE HCL 50 MG PO TABS
50.0000 mg | ORAL_TABLET | Freq: Three times a day (TID) | ORAL | Status: DC | PRN
  Administered 2017-09-15 – 2017-09-20 (×5): 50 mg via ORAL
  Filled 2017-09-14: qty 2
  Filled 2017-09-14: qty 1
  Filled 2017-09-14: qty 2
  Filled 2017-09-14: qty 1
  Filled 2017-09-14: qty 2
  Filled 2017-09-14: qty 1
  Filled 2017-09-14: qty 2

## 2017-09-14 MED ORDER — VITAMIN D (ERGOCALCIFEROL) 1.25 MG (50000 UNIT) PO CAPS: 50000.0000 [IU] | ORAL_CAPSULE | ORAL | Status: DC

## 2017-09-14 MED ORDER — DOCUSATE SODIUM 100 MG PO CAPS
100.0000 mg | ORAL_CAPSULE | Freq: Two times a day (BID) | ORAL | Status: DC
  Administered 2017-09-14 – 2017-09-15 (×2): 100 mg via ORAL
  Filled 2017-09-14 (×5): qty 1

## 2017-09-14 MED ORDER — HEPARIN SODIUM (PORCINE) 5000 UNIT/ML IJ SOLN
5000.0000 [IU] | Freq: Three times a day (TID) | INTRAMUSCULAR | Status: DC
  Administered 2017-09-14 – 2017-09-20 (×18): 5000 [IU] via SUBCUTANEOUS
  Filled 2017-09-14 (×18): qty 1

## 2017-09-14 MED ORDER — ATORVASTATIN CALCIUM 20 MG PO TABS
80.0000 mg | ORAL_TABLET | Freq: Every day | ORAL | Status: DC
  Administered 2017-09-14 – 2017-09-19 (×6): 80 mg via ORAL
  Filled 2017-09-14 (×7): qty 4

## 2017-09-14 MED ORDER — FAMOTIDINE IN NACL 20-0.9 MG/50ML-% IV SOLN
20.0000 mg | INTRAVENOUS | Status: DC
  Filled 2017-09-14: qty 50

## 2017-09-14 MED ORDER — HYDROXYZINE HCL 25 MG PO TABS
50.0000 mg | ORAL_TABLET | Freq: Three times a day (TID) | ORAL | Status: DC | PRN
  Administered 2017-09-18 (×2): 50 mg via ORAL
  Filled 2017-09-14: qty 2
  Filled 2017-09-14: qty 1
  Filled 2017-09-14: qty 2

## 2017-09-14 MED ORDER — FUROSEMIDE 10 MG/ML IJ SOLN
INTRAMUSCULAR | Status: AC
  Filled 2017-09-14: qty 4

## 2017-09-14 MED ORDER — ASPIRIN 81 MG PO CHEW
324.0000 mg | CHEWABLE_TABLET | ORAL | Status: AC
  Administered 2017-09-14: 324 mg via ORAL
  Filled 2017-09-14: qty 4

## 2017-09-14 MED ORDER — KETOTIFEN FUMARATE 0.025 % OP SOLN
1.0000 [drp] | Freq: Every day | OPHTHALMIC | Status: DC | PRN
  Filled 2017-09-14: qty 5

## 2017-09-14 MED ORDER — NITROGLYCERIN IN D5W 200-5 MCG/ML-% IV SOLN
0.0000 ug/min | Freq: Once | INTRAVENOUS | Status: AC
  Administered 2017-09-14: 5 ug/min via INTRAVENOUS
  Filled 2017-09-14: qty 250

## 2017-09-14 MED ORDER — ASPIRIN 300 MG RE SUPP: 300.0000 mg | RECTAL | Status: AC

## 2017-09-14 MED ORDER — ALLOPURINOL 100 MG PO TABS
100.0000 mg | ORAL_TABLET | Freq: Every day | ORAL | Status: DC
  Administered 2017-09-15 – 2017-09-20 (×6): 100 mg via ORAL
  Filled 2017-09-14 (×6): qty 1

## 2017-09-14 MED ORDER — FUROSEMIDE 40 MG PO TABS: 40.0000 mg | ORAL_TABLET | ORAL | Status: DC

## 2017-09-14 MED ORDER — FUROSEMIDE 10 MG/ML IJ SOLN
80.0000 mg | Freq: Once | INTRAMUSCULAR | Status: AC
  Administered 2017-09-14: 80 mg via INTRAVENOUS
  Filled 2017-09-14: qty 8

## 2017-09-14 MED ORDER — INSULIN ASPART 100 UNIT/ML ~~LOC~~ SOLN
0.0000 [IU] | Freq: Three times a day (TID) | SUBCUTANEOUS | Status: DC
  Administered 2017-09-14: 15 [IU] via SUBCUTANEOUS
  Administered 2017-09-15: 20 [IU] via SUBCUTANEOUS
  Filled 2017-09-14 (×2): qty 1

## 2017-09-14 MED ORDER — HYDRALAZINE HCL 20 MG/ML IJ SOLN
10.0000 mg | INTRAMUSCULAR | Status: DC | PRN
  Administered 2017-09-16: 10 mg via INTRAVENOUS
  Administered 2017-09-16: 20 mg via INTRAVENOUS
  Administered 2017-09-16: 10 mg via INTRAVENOUS
  Filled 2017-09-14 (×3): qty 1

## 2017-09-14 MED ORDER — FLUTICASONE PROPIONATE 50 MCG/ACT NA SUSP
2.0000 | Freq: Every day | NASAL | Status: DC | PRN
  Filled 2017-09-14: qty 16

## 2017-09-14 MED ORDER — FUROSEMIDE 20 MG PO TABS: 40.0000 mg | ORAL_TABLET | ORAL | Status: DC

## 2017-09-14 MED ORDER — NITROGLYCERIN IN D5W 200-5 MCG/ML-% IV SOLN
0.0000 ug/min | INTRAVENOUS | Status: DC
  Administered 2017-09-14: 15 ug/min via INTRAVENOUS

## 2017-09-14 MED ORDER — NITROGLYCERIN 2 % TD OINT
1.0000 [in_us] | TOPICAL_OINTMENT | Freq: Once | TRANSDERMAL | Status: AC
  Administered 2017-09-14: 1 [in_us] via TOPICAL
  Filled 2017-09-14: qty 1

## 2017-09-14 MED ORDER — ALBUTEROL SULFATE (2.5 MG/3ML) 0.083% IN NEBU: 3.0000 mL | INHALATION_SOLUTION | RESPIRATORY_TRACT | Status: DC | PRN

## 2017-09-14 MED ORDER — IPRATROPIUM-ALBUTEROL 0.5-2.5 (3) MG/3ML IN SOLN
3.0000 mL | Freq: Four times a day (QID) | RESPIRATORY_TRACT | Status: DC
  Administered 2017-09-14 – 2017-09-20 (×24): 3 mL via RESPIRATORY_TRACT
  Filled 2017-09-14 (×21): qty 3

## 2017-09-14 MED ORDER — ASPIRIN EC 81 MG PO TBEC
81.0000 mg | DELAYED_RELEASE_TABLET | Freq: Every day | ORAL | Status: DC
  Administered 2017-09-15 – 2017-09-20 (×6): 81 mg via ORAL
  Filled 2017-09-14 (×6): qty 1

## 2017-09-14 MED ORDER — INSULIN ASPART 100 UNIT/ML ~~LOC~~ SOLN
15.0000 [IU] | Freq: Three times a day (TID) | SUBCUTANEOUS | Status: DC
  Administered 2017-09-14 – 2017-09-15 (×2): 15 [IU] via SUBCUTANEOUS
  Filled 2017-09-14 (×2): qty 1

## 2017-09-14 MED ORDER — MOMETASONE FURO-FORMOTEROL FUM 200-5 MCG/ACT IN AERO
2.0000 | INHALATION_SPRAY | Freq: Two times a day (BID) | RESPIRATORY_TRACT | Status: DC
Start: 2017-09-14 — End: 2017-09-15
  Administered 2017-09-15: 2 via RESPIRATORY_TRACT
  Filled 2017-09-14: qty 8.8

## 2017-09-14 MED ORDER — PREGABALIN 75 MG PO CAPS
100.0000 mg | ORAL_CAPSULE | Freq: Three times a day (TID) | ORAL | Status: DC
  Administered 2017-09-14 – 2017-09-15 (×3): 100 mg via ORAL
  Filled 2017-09-14 (×3): qty 1

## 2017-09-14 MED ORDER — ISOSORBIDE MONONITRATE ER 30 MG PO TB24
60.0000 mg | ORAL_TABLET | Freq: Every day | ORAL | Status: DC
  Administered 2017-09-14 – 2017-09-15 (×2): 60 mg via ORAL
  Filled 2017-09-14 (×2): qty 2

## 2017-09-14 MED ORDER — VALACYCLOVIR HCL 500 MG PO TABS
500.0000 mg | ORAL_TABLET | Freq: Every day | ORAL | Status: DC
  Administered 2017-09-14 – 2017-09-20 (×7): 500 mg via ORAL
  Filled 2017-09-14 (×7): qty 1

## 2017-09-14 MED ORDER — FAMOTIDINE IN NACL 20-0.9 MG/50ML-% IV SOLN: 20.0000 mg | Freq: Two times a day (BID) | INTRAVENOUS | Status: DC

## 2017-09-14 MED ORDER — NITROGLYCERIN 0.4 MG SL SUBL: 0.4000 mg | SUBLINGUAL_TABLET | SUBLINGUAL | Status: DC | PRN

## 2017-09-14 MED ORDER — AMLODIPINE BESYLATE 10 MG PO TABS
10.0000 mg | ORAL_TABLET | Freq: Every day | ORAL | Status: DC
  Administered 2017-09-14 – 2017-09-20 (×7): 10 mg via ORAL
  Filled 2017-09-14: qty 1
  Filled 2017-09-14: qty 2
  Filled 2017-09-14 (×6): qty 1

## 2017-09-14 MED ORDER — INSULIN GLARGINE 100 UNIT/ML ~~LOC~~ SOLN
48.0000 [IU] | Freq: Two times a day (BID) | SUBCUTANEOUS | Status: DC
  Administered 2017-09-14 – 2017-09-15 (×2): 48 [IU] via SUBCUTANEOUS
  Filled 2017-09-14 (×3): qty 0.48

## 2017-09-14 MED ORDER — FERROUS SULFATE 325 (65 FE) MG PO TABS
325.0000 mg | ORAL_TABLET | Freq: Two times a day (BID) | ORAL | Status: DC
  Administered 2017-09-14 – 2017-09-20 (×12): 325 mg via ORAL
  Filled 2017-09-14 (×12): qty 1

## 2017-09-14 NOTE — ED Notes (Signed)
Pt placed back on bipap. sats remain WNL but increased WOB and pt feels worsening dyspnea.

## 2017-09-14 NOTE — ED Notes (Signed)
Pt sleeping. NAD. Remains on bipap.

## 2017-09-14 NOTE — ED Triage Notes (Signed)
SHOB X 2 days.  Recent wound vac. On abx. Arrived on cpap 85% sat.  3 NTG by EMS.  Rales throughout per EMS. Edema to face and legs.

## 2017-09-14 NOTE — ED Notes (Signed)
Sheets/gown changed.  purwick catheter was leaking

## 2017-09-14 NOTE — Plan of Care (Addendum)
Patient oriented to her room, admission packet given, understanding verbalized, Nitro gtt continued d/t BP  Remained elevated after receiving her oral meds. Pt weaned off bipap to 4 liters soon after arrival to ICU

## 2017-09-14 NOTE — Progress Notes (Signed)
Chaplain responded to a page for Emergency support. No family came with Pt. Chaplain prayed for staff and Pt silently. Will check back for family shortly   09/14/17 0710  Clinical Encounter Type  Visited With Patient;Health care provider  Visit Type Initial;Spiritual support  Referral From Care management  Spiritual Encounters  Spiritual Needs Prayer;Emotional  Advance Directives (For Healthcare)  Does Patient Have a Medical Advance Directive? No

## 2017-09-14 NOTE — Consult Note (Signed)
Cardiology Consultation:   Patient ID: [name]; [id number]; 01-16-1956   Admit date: 09/14/2017 Date of Consult: 09/14/2017  Primary Care Provider: [name], PA-C Primary Cardiologist: Burt Knack   Patient Profile:   [name] is a 62 y.o. female with a hx of CAD as detailed below, HFpEF, mild aortic stenosis, CKD stage IV (baseline ~ 2), anemia of chronic disease, DM, HTN, HLD, hepatitis C, morbid obesity, and OSA who is being seen today for the evaluation of SOB at the request of Dr. Manuella Ghazi.  History of Present Illness:   [name] was admitted in 09/2016 with a NSTEMI. Echo showed EF 45-50%, septal and apical hypokinesis. LHC at that time showed a proximal LAD 95% stenosis with successful PCI/DES. Echo from 02/2017 showed normal LVSF, Gr1DD, mild aortic stenosis with a mean gradient of 13 mmHg, mild aortic insufficiency, mild left atrial enlargement. Repeat LHC in 04/2017 for recurring chest pain that showed a widely patent mid LAD stent with mild to moderate LCx, OM, and diagonal disease that was unchanged from 09/2016. Normal LV filling pressure. Medical therapy was advised. She was noted to have a low HGB in 05/2017 when she presented for possible I&D of left thigh abscess. Seen in the ED 07/2017 for SOB and weight gain to 323 pounds. She was diuresed with IV Lasix. Seen in the office on 08/05/2017 with a weight loss of 21 pounds from her ED visit. She continued to note SOB with exertion, orthopnea, and lower extremity edema. Weight of 302 pounds at that time. Labs at that time showed a BNP of 401, glucose of 607, SCr 1.81, K+ 4.4. She was admitted 08/11/17 through 08/19/17 for the left thigh abscess and underwent successful I&D followed by wound VAC. Post-operative course was complicated by post-op anemia requiring pRBC transfusion. SCr trended up to a peak of 3.96 during her admission and had improved to 2.9 at discharge. In follow up with PCP on 2/20 SCr had improved to 2.08   She  has noticed increased SOB, dry cough, and lower extremity swelling for the past 2-3 days. No chest pain. She is unable to tell me how her weight has been trending at home due to BiPAP currently. She feels similar to how she has felt in the past with PNA. She reports compliance with her medications and diet. Because of her worsening SOB she called EMS who found the patient to be hypoxic in the field and placed her on CPAP.   Upon her arrival to [facility] she was noted to have a BP of 214/81, HR 103 bpm, weight 300 pounds, O2 saturation of 85% on room air requiring BiPAP, RR 22, temp 98.5. CXR showed diffuse bilateral airspace opacities. EKG as below. Labs showed troponin negative x 1, BNP 195, D-dimer 421.69, WBC 10.7, HGB 9.1, PLT 272, Na 139, K+ 4.4, glucose 245, SCr 2.89, BUN 80. In the ED she was given IV Lasix 80 mg x 2, Solu-Medrol, nitropaste was applied and she was started on a nitro gtt. No UOP documented. Renal ultrasound negative for hydronephrosis. BP has improved into the 007M systolic currently. She remains on BiPAP.    Past Medical History:  Diagnosis Date  . Aortic stenosis    Echo 8/18: mean 13, peak 28, LVOT/AV mean velocity 0.51  . Asthma    As a child   . Bronchitis   . CAD (coronary artery disease)    a. 09/2016: 50% Ost 1st Mrg stenosis, 50% 2nd  Mrg stenosis, 20% Mid-Cx, 95% Prox LAD, 40% mid-LAD, and 10% dist-LAD stenosis. Staged PCI with DES to Prox-LAD.   [name] Chronic combined systolic and diastolic CHF (congestive heart failure) ([place]) 2011   echo 2/18: EF 55-60, normal wall motion, grade 2 diastolic dysfunction, trivial AI // echo 3/18: Septal and apical HK, EF 45-50, normal wall motion, trivial AI, mild LAE, PASP 38 // echo 8/18: EF 60-65, normal wall motion, grade 1 diastolic dysfunction, calcified aortic valve leaflets, mild aortic stenosis (mean 13, peak 28, LVOT/AV mean velocity 0.51), mild AI, moderate MAC, mild LAE, trivial TR   . Complication of anesthesia   . Diabetes  mellitus Dx 1989  . Hepatitis C Dx 2013  . Hypertension Dx 1989  . Obesity   . Pancreatitis 2013  . Refusal of blood transfusions as patient is Jehovah's Witness   . Tendinitis   . Ulcer 2010    Past Surgical History:  Procedure Laterality Date  . APPLICATION OF WOUND VAC Left 08/14/2017   Procedure: APPLICATION OF WOUND VAC Exchange;  Surgeon: Robert Bellow, MD;  Location: [facility] ORS;  Service: General;  Laterality: Left;  . CHOLECYSTECTOMY    . CORONARY STENT INTERVENTION N/A 09/18/2016   Procedure: Coronary Stent Intervention;  Surgeon: Troy Sine, MD;  Location: [place] [facility];  Service: Cardiovascular;  Laterality: N/A;  . DRESSING CHANGE UNDER ANESTHESIA Left 08/15/2017   Procedure: exploration of wound for bleeding;  Surgeon: Robert Bellow, MD;  Location: [facility] ORS;  Service: General;  Laterality: Left;  . EYE SURGERY    . INCISION AND DRAINAGE ABSCESS Left 08/12/2017   Procedure: INCISION AND DRAINAGE ABSCESS;  Surgeon: Robert Bellow, MD;  Location: [facility] ORS;  Service: General;  Laterality: Left;  . KNEE ARTHROSCOPY    . LEFT HEART CATH N/A 09/18/2016   Procedure: Left Heart Cath;  Surgeon: Troy Sine, MD;  Location: [place] [facility];  Service: Cardiovascular;  Laterality: N/A;  . LEFT HEART CATH AND CORONARY ANGIOGRAPHY N/A 09/16/2016   Procedure: Left Heart Cath and Coronary Angiography;  Surgeon: Burnell Blanks, MD;  Location: [place] [facility];  Service: Cardiovascular;  Laterality: N/A;  . LEFT HEART CATH AND CORONARY ANGIOGRAPHY N/A 04/29/2017   Procedure: LEFT HEART CATH AND CORONARY ANGIOGRAPHY;  Surgeon: Nelva Bush, MD;  Location: [place] [facility];  Service: Cardiovascular;  Laterality: N/A;  . TUBAL LIGATION    . TUBAL LIGATION       Home Meds: Prior to Admission medications   Medication Sig Start Date End Date Taking? Authorizing Provider  albuterol (PROVENTIL) (2.5 MG/3ML) 0.083% nebulizer solution Take 3 mLs (2.5 mg total) by  nebulization every 6 (six) hours as needed for wheezing or shortness of breath. 09/01/17  Yes [name], PA-C  amLODipine (NORVASC) 10 MG tablet Take 1 tablet (10 mg total) by mouth daily. 04/14/17  Yes Charlott Rakes, MD  aspirin EC 81 MG EC tablet Take 1 tablet (81 mg total) by mouth daily. 09/19/16  Yes Strader, [place] M, PA-C  atorvastatin (LIPITOR) 80 MG tablet Take 1 tablet (80 mg total) by mouth daily at 6 PM. 02/12/17  Yes Funches, Josalyn, MD  carvedilol (COREG) 25 MG tablet TAKE 1 TABLET BY MOUTH 2 TIMES DAILY WITH A MEAL. 02/12/17  Yes Funches, Josalyn, MD  Exenatide ER (BYDUREON BCISE) 2 MG/0.85ML AUIJ Inject 2 mg into the skin once a week. 09/01/17  Yes [name], PA-C  ferrous sulfate 325 (65  FE) MG tablet Take 1 tablet (325 mg total) by mouth 2 (two) times daily with a meal. 04/14/17  Yes Newlin, Enobong, MD  fluticasone (FLONASE) 50 MCG/ACT nasal spray Place 2 sprays into both nostrils daily as needed for allergies or rhinitis. 01/07/17  Yes Funches, Josalyn, MD  furosemide (LASIX) 40 MG tablet Take 1 tablet (40 mg total) by mouth every other day. 09/01/17 09/01/18 Yes [name], PA-C  hydrALAZINE (APRESOLINE) 50 MG tablet Take 1 tablet (50 mg total) by mouth 3 (three) times daily. 02/12/17  Yes Funches, Josalyn, MD  insulin aspart (NOVOLOG) 100 UNIT/ML FlexPen Inject 15 Units into the skin 3 (three) times daily with meals. 08/19/17  Yes [name], MD  Insulin Glargine (LANTUS SOLOSTAR) 100 UNIT/ML Solostar Pen Inject 48 Units into the skin 2 (two) times daily. 08/19/17  Yes [name], MD  isosorbide mononitrate (IMDUR) 60 MG 24 hr tablet Take 1 tablet (60 mg total) by mouth daily. 05/05/17  Yes Simmons, Brittainy M, PA-C  MITIGARE 0.6 MG CAPS Take 1 capsule by mouth daily. 06/30/17  Yes Charlott Rakes, MD  omeprazole (PRILOSEC) 20 MG capsule Take 1 capsule (20 mg total) by mouth daily. 04/14/17  Yes Charlott Rakes, MD  pregabalin (LYRICA) 100 MG capsule Take 1  capsule (100 mg total) by mouth 3 (three) times daily. 09/01/17  Yes [name], PA-C  ticagrelor (BRILINTA) 90 MG TABS tablet Take 1 tablet (90 mg total) by mouth 2 (two) times daily. 09/07/17  Yes [name], PA-C  Vitamin D, Ergocalciferol, (DRISDOL) 50000 units CAPS capsule Take 1 capsule (50,000 Units total) by mouth every 30 (thirty) days. 09/01/17  Yes [name], PA-C  albuterol (VENTOLIN HFA) 108 (90 Base) MCG/ACT inhaler Inhale 2 puffs into the lungs every 4 (four) hours as needed for wheezing or shortness of breath. 09/01/17   [name], PA-C  allopurinol (ZYLOPRIM) 100 MG tablet Take 1 tablet (100 mg total) by mouth daily. 09/01/17   [name], PA-C  amoxicillin-clavulanate (AUGMENTIN) 875-125 MG tablet Take 1 tablet by mouth 2 (two) times daily. 09/09/17   [name], PA-C  Blood Glucose Monitoring Suppl (ACCU-CHEK AVIVA) device Use as instructed daily. 04/14/17   Charlott Rakes, MD  docusate sodium (COLACE) 100 MG capsule Take 1 capsule (100 mg total) every 12 (twelve) hours by mouth. Patient not taking: Reported on 08/20/2017 05/25/17   Jola Schmidt, MD  Fluticasone-Salmeterol (ADVAIR DISKUS) 250-50 MCG/DOSE AEPB Inhale 1 puff into the lungs 2 (two) times daily. 09/01/17   [name], PA-C  glucose blood (ACCU-CHEK AVIVA) test strip Use as instructed daily 04/14/17   Charlott Rakes, MD  glucose blood (TRUE METRIX BLOOD GLUCOSE TEST) test strip Use as instructed 03/19/17   Barrett, Evelene Croon, PA-C  hydrOXYzine (ATARAX/VISTARIL) 50 MG tablet Take 1 tablet (50 mg total) by mouth 3 (three) times daily as needed for itching. 08/04/17   Charlott Rakes, MD  ketotifen (ZADITOR) 0.025 % ophthalmic solution Place 1 drop into both eyes daily as needed (for dry eyes). 09/01/17   [name], PA-C  Lancet Devices [facility]) lancets Use as instructed daily. 04/14/17   Charlott Rakes, MD  nitroGLYCERIN (NITROSTAT) 0.4 MG SL  tablet Place 1 tablet (0.4 mg total) under the tongue every 5 (five) minutes x 3 doses as needed for chest pain. 03/19/17   Barrett, Evelene Croon, PA-C  oxyCODONE-acetaminophen (PERCOCET/ROXICET) 5-325 MG tablet Take 1 tablet by mouth every 6 (six) hours  as needed for severe pain. 09/01/17   [name], PA-C  valACYclovir (VALTREX) 500 MG tablet Take 1 tablet (500 mg total) by mouth daily. 04/14/17   Charlott Rakes, MD  gabapentin (NEURONTIN) 100 MG capsule Take 1 capsule (100 mg total) by mouth 3 (three) times daily. 08/19/17 09/01/17  [name], MD    Inpatient Medications: Scheduled Meds:  Continuous Infusions:  PRN Meds:   Allergies:   Allergies  Allergen Reactions  . Shellfish Allergy Anaphylaxis and Swelling  . Diazepam Other (See Comments)    "felt like out of body experience"  . Morphine And Related Itching    Social History:   Social History   Socioeconomic History  . Marital status: Divorced    Spouse name: Not on file  . Number of children: Not on file  . Years of education: Not on file  . Highest education level: Not on file  Social Needs  . Financial resource strain: Not on file  . Food insecurity - worry: Not on file  . Food insecurity - inability: Not on file  . Transportation needs - medical: Not on file  . Transportation needs - non-medical: Not on file  Occupational History  . Not on file  Tobacco Use  . Smoking status: Former Smoker    Last attempt to quit: 10/25/1980    Years since quitting: 36.9  . Smokeless tobacco: Never Used  . Tobacco comment: quit smoking in 1982  Substance and Sexual Activity  . Alcohol use: Yes    Comment: 3 times in last year  . 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 Sexual activity: Not on file    Comment: Not asked  Other Topics Concern  . Not on file  Social History Narrative  . Not on file     Family History:   Family History  Problem Relation Age of Onset  . Colon cancer Mother   .  Heart attack Other   . Heart attack Maternal Grandmother   . Hypertension Sister   . Hypertension Brother   . Diabetes Paternal Grandmother   . Breast cancer Neg Hx     ROS:  Review of Systems  Constitutional: Positive for malaise/fatigue. Negative for chills, diaphoresis, fever and weight loss.  HENT: Negative for congestion.   Eyes: Negative for discharge and redness.  Respiratory: Positive for cough and shortness of breath. Negative for hemoptysis, sputum production and wheezing.   Cardiovascular: Positive for orthopnea and leg swelling. Negative for chest pain, palpitations, claudication and PND.  Gastrointestinal: Negative for abdominal pain, blood in stool, heartburn, melena, nausea and vomiting.  Genitourinary: Negative for hematuria.  Musculoskeletal: Negative for falls and myalgias.  Skin: Negative for rash.  Neurological: Positive for weakness and headaches. Negative for dizziness, tingling, tremors, sensory change, speech change, focal weakness and loss of consciousness.  Endo/Heme/Allergies: Does not bruise/bleed easily.  Psychiatric/Behavioral: Negative for substance abuse. The patient is not nervous/anxious.   All other systems reviewed and are negative.     Physical Exam/Data:   Vitals:   09/14/17 0915 09/14/17 0930 09/14/17 0945 09/14/17 1000  BP: (!) 193/120 (!) 176/78 (!) 174/86 (!) 172/89  Pulse: 96 96 95 93  Resp: (!) 21 18 (!) 21 19  Temp:      TempSrc:      SpO2: 96% 97% 96% 97%  Weight:      Height:        Intake/Output Summary (Last 24 hours) at  09/14/2017 1038 Last data filed at 09/14/2017 0819 Gross per 24 hour  Intake -  Output 0 ml  Net 0 ml   Filed Weights   09/14/17 0709  Weight: 300 lb (136.1 kg)   Body mass index is 45.61 kg/m.   Physical Exam: General: Well developed, well nourished, in no acute distress. Head: Normocephalic, atraumatic, sclera non-icteric, no xanthomas, nares without discharge. Neck: Negative for carotid bruits.  JVD elevated. Lungs: Diminished breath sounds bilaterally with bibasilar crackles. BiPAP in place.  Heart: RRR with S1 S2. II/VI systolic murmur RUSB, no rubs, or gallops appreciated. Abdomen: Soft, non-tender, non-distended with normoactive bowel sounds. No hepatomegaly. No rebound/guarding. No obvious abdominal masses. Msk:  Strength and tone appear normal for age. Extremities: No clubbing or cyanosis. 2+ bilateral pitting lower extremity edema. Distal pedal pulses are 2+ and equal bilaterally. Neuro: Alert and oriented X 3. No facial asymmetry. No focal deficit. Moves all extremities spontaneously. Psych:  Responds to questions appropriately with a normal affect.   EKG:  The EKG was personally reviewed and demonstrates: sinus tachycardia, 103 bpm, nonspecific st/t changes Telemetry:  Telemetry was personally reviewed and demonstrates: NSR  Weights: [employer]   09/14/17 0709  Weight: 300 lb (136.1 kg)    Relevant CV Studies: LHC 04/2017: Coronary Findings   Diagnostic  Dominance: Left  Left Anterior Descending  Prox LAD to Mid LAD lesion 95% stenosed  The lesion was previously treated using a drug eluting stent between 6-12 months ago. Previously placed stent displays no restenosis.  Mid LAD lesion 40% stenosed  Mid LAD lesion.  Dist LAD lesion 10% stenosed  Dist LAD lesion.  First Diagonal Branch  Vessel is moderate in size.  First Septal Branch  Vessel is small in size.  Second Diagonal Branch  Vessel is small in size.  Ost 2nd Diag lesion 60% stenosed  Ost 2nd Diag lesion.  Second Septal Branch  Vessel is small in size.  Third Diagonal Branch  Vessel is small in size.  Left Circumflex  Mid Cx lesion 20% stenosed  Mid Cx lesion.  First Obtuse Marginal Branch  Vessel is moderate in size.  Ost 1st Mrg to 1st Mrg lesion 60% stenosed  Ost 1st Mrg to 1st Mrg lesion.  Second Obtuse Marginal Branch  Vessel is moderate in size.  Ost 2nd Mrg to 2nd Mrg lesion 50%  stenosed  Ost 2nd Mrg to 2nd Mrg lesion.  Third Obtuse Marginal Branch  Vessel is moderate in size.  Fourth Obtuse Marginal Branch  Vessel is small in size.  Left Posterior Descending Artery  Vessel is moderate in size.  Right Coronary Artery  Vessel is small.  Intervention   No interventions have been documented.  Left Heart   Left Ventricle LV end diastolic pressure is normal.  Coronary Diagrams   Diagnostic Diagram        Conclusion   Conclusions: 1. Widely patent mid LAD stent. 2. No significant change in mild to moderate LCx, OM, and diagonal disease compared with 09/2016. 3. Normal left ventricular filling pressure.  Recommendations: 1. Continue medical therapy and secondary prevention. I will restart isosorbide mononitrate at 90 mg daily with hope of weaning off NTG infusion later today. 2. Gentle hydration given CKD and normal LVEDP. Hold furosemide today and reassess volume status and renal function tomorrow before restarting diuretic therapy.    TTE 02/2017: Study Conclusions  - Left ventricle: The cavity size was normal. Wall thickness was   normal. Systolic function  was normal. The estimated ejection   fraction was in the range of 60% to 65%. Wall motion was normal;   there were no regional wall motion abnormalities. Doppler   parameters are consistent with abnormal left ventricular   relaxation (grade 1 diastolic dysfunction). - Aortic valve: Trileaflet; mildly thickened, mildly calcified   leaflets. There was mild stenosis. There was mild regurgitation.   Peak velocity (S): 265 cm/s. Mean gradient (S): 13 mm Hg. Valve   area (VTI): 1.82 cm^2. Valve area (Vmax): 1.25 cm^2. Valve area   (Vmean): 1.45 cm^2. - Mitral valve: Moderately calcified annulus. - Left atrium: The atrium was mildly dilated. - Tricuspid valve: There was trivial regurgitation.  Laboratory Data:  Chemistry Recent Labs  Lab 10/05/17 0710  NA 139  K 4.4  CL 109  CO2 17*    GLUCOSE 245*  BUN 80*  CREATININE 2.89*  CALCIUM 8.0*  GFRNONAA 17*  GFRAA 19*  ANIONGAP 13    Recent Labs  Lab 05-Oct-2017 0706  PROT 7.6  ALBUMIN 3.7  AST 32  ALT 52  ALKPHOS 291*  BILITOT 0.7   Hematology Recent Labs  Lab 05-Oct-2017 0710  WBC 10.7  RBC 3.26*  HGB 9.1*  HCT 27.8*  MCV 85.4  MCH 28.0  MCHC 32.8  RDW 18.9*  PLT 272   Cardiac Enzymes Recent Labs  Lab 10/05/17 0710  TROPONINI <0.03   No results for input(s): TROPIPOC in the last 168 hours.  BNP Recent Labs  Lab 05-Oct-2017 0710  BNP 195.0*    DDimer No results for input(s): DDIMER in the last 168 hours.  Radiology/Studies:  US Renal  Result Date: 2017-10-05 IMPRESSION: Negative for hydronephrosis.  No acute abnormality. Increased cortical echogenicity bilaterally consistent with medical renal disease. Electronically Signed   By: Inge Rise M.D.   On: 10/05/17 09:39   Dg Chest Port 1 View  Result Date: 10-05-2017 IMPRESSION: Diffuse bilateral airspace opacities could reflect edema/CHF or infection. Electronically Signed   By: Rolm Baptise M.D.   On: 10/05/17 07:36    Assessment and Plan:   1. Acute respiratory failure with hypoxia: -Likely in the setting of her hypertensive heart disease with acute on chronic diastolic CHF -BiPAP per PCCM -Check echo -D dimer negative  2. Acute on chronic diastolic CHF: -Significantly volume overloaded -IV Lasix with KCl repletion and close monitoring of renal function -Echo pending -CHF education -Daily weights and strict Is and Os -Not on spironolactone given CKD  3. CAD: -No chest pain -ASA and Brilinta -Echo pending -Lipitor  4. Hypertensive heart disease: -Exacerbated her CHF -BP slowly improving on nitro gtt, titrate as needed -Amlodipine/hydralazine/Imdur -Not on beta blocker currently given respiratory status  5. Acute on CKD stage IV: -Monitor with diuresis   6. Anemia of chronic disease: -HGB, low though stable -Per  IM  7. Diabetes: -Poorly controlled -Per IM  8. Morbid obesity/OSA/possible OHS: -BiPAP as above   For questions or updates, please contact [place] Please consult [email or web address] for contact info under Cardiology/STEMI.   Signed, Christell Faith, PA-C [facility] Pager: [phone] 05-Oct-2017, 10:38 AM

## 2017-09-14 NOTE — Progress Notes (Signed)
Renal adjustment:    Patient ordered famotidine 20mg  IV BID. Patient's estimated Creatinine Clearance is 6mL/min. Per policy, will transition patient to famotidine 20mg  IV Q24hr.   Pharmacy will continue to monitor and adjust per consult.   MLS  03.05.2019 1552

## 2017-09-14 NOTE — Consult Note (Signed)
Name: CHANNA HAZELETT MRN: [id number] DOB: 1956-06-17    ADMISSION DATE:  09/14/2017 CONSULTATION DATE: 09/14/2017  REFERRING MD : Dr. Manuella Ghazi   CHIEF COMPLAINT: Shortness of Breath   BRIEF PATIENT DESCRIPTION: 62 yo female admitted with malignant hypertension, acute on chronic renal failure, and acute on chronic hypoxic respiratory failure secondary to CHF exacerbation vs. possible pneumonia requiring Bipap   SIGNIFICANT EVENTS  03/5-Pt admitted to stepdown unit   STUDIES:  Renal US 03/5>>Negative for hydronephrosis.  No acute abnormality. Increased cortical echogenicity bilaterally consistent with medical renal disease.  HISTORY OF PRESENT ILLNESS:   This is a 62 yo female with a PMH of Pancreatitis, Obesity, HTN, Hepatitis C, Diabetes Mellitus, Chronic Combined Systolic and Diastolic CHF, CAD, Bronchitis, Asthma, and Aortic Stenosis.  She presented to [facility] ER via EMS 03/5 with shortness of breath onset of symptoms 2 days prior to presentation.  Per EMS pt had  audible rales throughout, generalized edema, and found hypoxic requiring CPAP and administration of 3 nitroglycerin.  She recently required left thigh wound vac and outpatient antibiotic treatment following I&D of left thigh abscess.  Upon arrival to ER her systolic bp was in the 299'B with O2 sats 85% on CPAP.  CXR revealed bilateral opacities concerning for pulmonary edema vs. pneumonia. Therefore, nitroglycerin gtt initiated, pt received iv lasix, and transitioned to Bipap.  She was subsequently admitted to the stepdown unit for further workup and treatment.    PAST MEDICAL HISTORY :   has a past medical history of Aortic stenosis, Asthma, Bronchitis, CAD (coronary artery disease), Chronic combined systolic and diastolic CHF (congestive heart failure) ([place]) (7169), Complication of anesthesia, Diabetes mellitus (Dx 1989), Hepatitis C (Dx 2013), Hypertension (Dx 1989), Obesity, Pancreatitis (2013), Refusal of blood transfusions as patient  is Jehovah's Witness, Tendinitis, and Ulcer (2010).  has a past surgical history that includes Tubal ligation; Cholecystectomy; Knee arthroscopy; Tubal ligation; Eye surgery; LEFT HEART CATH AND CORONARY ANGIOGRAPHY (N/A, 09/16/2016); CORONARY STENT INTERVENTION (N/A, 09/18/2016); Left Heart Cath (N/A, 09/18/2016); LEFT HEART CATH AND CORONARY ANGIOGRAPHY (N/A, 04/29/2017); Incision and drainage abscess (Left, 08/12/2017); Application if wound vac (Left, 08/14/2017); and Dressing change under anesthesia (Left, 08/15/2017). Prior to Admission medications   Medication Sig Start Date End Date Taking? Authorizing Provider  albuterol (PROVENTIL) (2.5 MG/3ML) 0.083% nebulizer solution Take 3 mLs (2.5 mg total) by nebulization every 6 (six) hours as needed for wheezing or shortness of breath. 09/01/17  Yes [name], PA-C  amLODipine (NORVASC) 10 MG tablet Take 1 tablet (10 mg total) by mouth daily. 04/14/17  Yes Charlott Rakes, MD  aspirin EC 81 MG EC tablet Take 1 tablet (81 mg total) by mouth daily. 09/19/16  Yes Strader, [place] M, PA-C  atorvastatin (LIPITOR) 80 MG tablet Take 1 tablet (80 mg total) by mouth daily at 6 PM. 02/12/17  Yes Funches, Josalyn, MD  carvedilol (COREG) 25 MG tablet TAKE 1 TABLET BY MOUTH 2 TIMES DAILY WITH A MEAL. 02/12/17  Yes Funches, Josalyn, MD  Exenatide ER (BYDUREON BCISE) 2 MG/0.85ML AUIJ Inject 2 mg into the skin once a week. 09/01/17  Yes [name], PA-C  ferrous sulfate 325 (65 FE) MG tablet Take 1 tablet (325 mg total) by mouth 2 (two) times daily with a meal. 04/14/17  Yes Newlin, Enobong, MD  fluticasone (FLONASE) 50 MCG/ACT nasal spray Place 2 sprays into both nostrils daily as needed for allergies or rhinitis. 01/07/17  Yes Funches, Adriana Mccallum, MD  furosemide (LASIX) 40 MG  tablet Take 1 tablet (40 mg total) by mouth every other day. 09/01/17 09/01/18 Yes [name], PA-C  hydrALAZINE (APRESOLINE) 50 MG tablet Take 1 tablet (50 mg total) by mouth 3 (three)  times daily. 02/12/17  Yes Funches, Josalyn, MD  insulin aspart (NOVOLOG) 100 UNIT/ML FlexPen Inject 15 Units into the skin 3 (three) times daily with meals. 08/19/17  Yes [name], MD  Insulin Glargine (LANTUS SOLOSTAR) 100 UNIT/ML Solostar Pen Inject 48 Units into the skin 2 (two) times daily. 08/19/17  Yes [name], MD  isosorbide mononitrate (IMDUR) 60 MG 24 hr tablet Take 1 tablet (60 mg total) by mouth daily. 05/05/17  Yes Simmons, Brittainy M, PA-C  MITIGARE 0.6 MG CAPS Take 1 capsule by mouth daily. 06/30/17  Yes Charlott Rakes, MD  omeprazole (PRILOSEC) 20 MG capsule Take 1 capsule (20 mg total) by mouth daily. 04/14/17  Yes Charlott Rakes, MD  pregabalin (LYRICA) 100 MG capsule Take 1 capsule (100 mg total) by mouth 3 (three) times daily. 09/01/17  Yes [name], PA-C  ticagrelor (BRILINTA) 90 MG TABS tablet Take 1 tablet (90 mg total) by mouth 2 (two) times daily. 09/07/17  Yes [name], PA-C  Vitamin D, Ergocalciferol, (DRISDOL) 50000 units CAPS capsule Take 1 capsule (50,000 Units total) by mouth every 30 (thirty) days. 09/01/17  Yes [name], PA-C  albuterol (VENTOLIN HFA) 108 (90 Base) MCG/ACT inhaler Inhale 2 puffs into the lungs every 4 (four) hours as needed for wheezing or shortness of breath. 09/01/17   [name], PA-C  allopurinol (ZYLOPRIM) 100 MG tablet Take 1 tablet (100 mg total) by mouth daily. 09/01/17   [name], PA-C  amoxicillin-clavulanate (AUGMENTIN) 875-125 MG tablet Take 1 tablet by mouth 2 (two) times daily. 09/09/17   [name], PA-C  Blood Glucose Monitoring Suppl (ACCU-CHEK AVIVA) device Use as instructed daily. 04/14/17   Charlott Rakes, MD  docusate sodium (COLACE) 100 MG capsule Take 1 capsule (100 mg total) every 12 (twelve) hours by mouth. Patient not taking: Reported on 08/20/2017 05/25/17   Jola Schmidt, MD  Fluticasone-Salmeterol (ADVAIR DISKUS) 250-50 MCG/DOSE AEPB Inhale 1 puff into the lungs  2 (two) times daily. 09/01/17   [name], PA-C  glucose blood (ACCU-CHEK AVIVA) test strip Use as instructed daily 04/14/17   Charlott Rakes, MD  glucose blood (TRUE METRIX BLOOD GLUCOSE TEST) test strip Use as instructed 03/19/17   Barrett, Evelene Croon, PA-C  hydrOXYzine (ATARAX/VISTARIL) 50 MG tablet Take 1 tablet (50 mg total) by mouth 3 (three) times daily as needed for itching. 08/04/17   Charlott Rakes, MD  ketotifen (ZADITOR) 0.025 % ophthalmic solution Place 1 drop into both eyes daily as needed (for dry eyes). 09/01/17   [name], PA-C  Lancet Devices [facility]) lancets Use as instructed daily. 04/14/17   Charlott Rakes, MD  nitroGLYCERIN (NITROSTAT) 0.4 MG SL tablet Place 1 tablet (0.4 mg total) under the tongue every 5 (five) minutes x 3 doses as needed for chest pain. 03/19/17   Barrett, Evelene Croon, PA-C  oxyCODONE-acetaminophen (PERCOCET/ROXICET) 5-325 MG tablet Take 1 tablet by mouth every 6 (six) hours as needed for severe pain. 09/01/17   [name], PA-C  valACYclovir (VALTREX) 500 MG tablet Take 1 tablet (500 mg total) by mouth daily. 04/14/17   Charlott Rakes, MD  gabapentin (NEURONTIN) 100 MG capsule Take 1 capsule (100 mg total) by mouth 3 (three) times daily. 08/19/17 09/01/17  Posey Pronto,  Gus Height, MD   Allergies  Allergen Reactions  . Shellfish Allergy Anaphylaxis and Swelling  . Diazepam Other (See Comments)    "felt like out of body experience"  . Morphine And Related Itching    FAMILY HISTORY:  family history includes Colon cancer in her mother; Diabetes in her paternal grandmother; Heart attack in her maternal grandmother and other; Hypertension in her brother and sister. SOCIAL HISTORY:  reports that she quit smoking about 36 years ago. she has never used smokeless tobacco. She reports that she drinks alcohol. She reports that she does not use drugs.  REVIEW OF SYSTEMS:   Unable to assess pt lethargic   SUBJECTIVE:  Unable to assess pt  lethargic   VITAL SIGNS: Temp:  [98 F (36.7 C)-98.5 F (36.9 C)] 98.3 F (36.8 C) (03/05 2000) Pulse Rate:  [91-103] 92 (03/05 2000) Resp:  [14-25] 19 (03/05 2000) BP: (140-214)/(66-144) 145/91 (03/05 2000) SpO2:  [85 %-100 %] 99 % (03/05 2000) FiO2 (%):  [35 %-40 %] 35 % (03/05 1959) Weight:  [136.1 kg (300 lb)-145.3 kg (320 lb 5.3 oz)] 145.3 kg (320 lb 5.3 oz) (03/05 1600)  PHYSICAL EXAMINATION: General: acutely ill appearing female, NAD on Bipap  Neuro: lethargic, follows commands, PERRL  HEENT: supple, no JVD  Cardiovascular: nsr, s1s2, no M/R/G  Lungs: crackles throughout, even, non labored  Abdomen: +BS x4, obese, soft, non tender  Musculoskeletal: 2+ generalized edema  Skin: left thigh wound vac with dried drainage   Recent Labs  Lab 09/14/17 0710  NA 139  K 4.4  CL 109  CO2 17*  BUN 80*  CREATININE 2.89*  GLUCOSE 245*   Recent Labs  Lab 09/14/17 0710  HGB 9.1*  HCT 27.8*  WBC 10.7  PLT 272   US Renal  Result Date: 09/14/2017 CLINICAL DATA:  Shortness of breath for 2 days.  Oliguria. EXAM: RENAL / URINARY TRACT ULTRASOUND COMPLETE COMPARISON:  Renal ultrasound 03/19/2017. FINDINGS: Right Kidney: Length: 11.8 cm. Cortical echogenicity is increased. No hydronephrosis. Two small cysts are identified measuring approximately 1 cm in diameter. Left Kidney: Length: 11.2 cm. Cortical echogenicity is increased. No hydronephrosis. Single simple cyst measures 2.4 cm in diameter. Bladder: Appears normal for degree of bladder distention. IMPRESSION: Negative for hydronephrosis.  No acute abnormality. Increased cortical echogenicity bilaterally consistent with medical renal disease. Electronically Signed   By: Inge Rise M.D.   On: 09/14/2017 09:39   Dg Chest Port 1 View  Result Date: 09/14/2017 CLINICAL DATA:  Shortness of Breath EXAM: PORTABLE CHEST 1 VIEW COMPARISON:  09/04/2017 FINDINGS: Cardiomegaly. Diffuse bilateral airspace opacities are new since prior study.  No visible effusions. No acute bony abnormality. IMPRESSION: Diffuse bilateral airspace opacities could reflect edema/CHF or infection. Electronically Signed   By: Rolm Baptise M.D.   On: 09/14/2017 07:36    ASSESSMENT / PLAN: Acute on chronic hypoxic respiratory failure secondary to CHF exacerbation and possible pneumonia  Acute encephalopathy  Malignant HTN Acute on chronic renal failure  Anemia without obvious acute blood loss Left Thigh Wound Vac Hx: Asthma  P: Prn Bipap for encephalopathy, dyspnea, and/or hypoxia  Repeat CXR in am  Continue lasix  Continue bronchodilator therapy  Continuous telemetry monitoring  Trend troponin's  Echo pending  Continue po cardiac medications once able to tolerate po's Prn nitroglycerin gtt and hydralazine for bp control  Trend BMP's Replace electrolytes as indicated  Monitor UOP  Subq heparin for VTE prophylaxis  Trend CBC Monitor for s/sx of bleeding  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

## 2017-09-14 NOTE — H&P (Signed)
[facility] [place] NAME: [name]    MR#:  [id number]  DATE OF BIRTH:  [birth year]  DATE OF ADMISSION:  09/14/2017  PRIMARY CARE PHYSICIAN: [name], PA-C   REQUESTING/REFERRING PHYSICIAN: Earleen Newport, MD  CHIEF COMPLAINT:   Chief Complaint  Patient presents with  . Respiratory Distress    HISTORY OF PRESENT ILLNESS:  [name]  is a 62 y.o. female with a known history of aortic stenosis, asthma, bronchitis, CHF, diabetes is being admitted for acute hypoxic resp failure. She is feeling shortness of breath for at least the past 2 days.  Patient states she has had a cough but cannot actually cough anything up.  EMS arrived to find her hypoxic and she was placed on CPAP.  Rales were noted throughout her lung fields by EMS.  She was also noted to be very edematous diffusely.  She is currently being treated with antibiotics for a wound on her left upper extremity which also has a wound VAC.  She arrived on CPAP with oxygen saturations of 85%. Her SBP in 200s. In the ED, she was placed on BIPAP and started her on nitroglycerin via IV infusion with improvement in her blood pressure.  She was also given IV Lasix but has not diuresed well. EDP has talked with Nephrology who has requested renal US and they will see her later. PAST MEDICAL HISTORY:   Past Medical History:  Diagnosis Date  . Aortic stenosis    Echo 8/18: mean 13, peak 28, LVOT/AV mean velocity 0.51  . Asthma    As a child   . Bronchitis   . CAD (coronary artery disease)    a. 09/2016: 50% Ost 1st Mrg stenosis, 50% 2nd Mrg stenosis, 20% Mid-Cx, 95% Prox LAD, 40% mid-LAD, and 10% dist-LAD stenosis. Staged PCI with DES to Prox-LAD.   [name] Chronic combined systolic and diastolic CHF (congestive heart failure) ([place]) 2011   echo 2/18: EF 55-60, normal wall motion, grade 2 diastolic dysfunction, trivial AI // echo 3/18: Septal and apical HK, EF 45-50, normal wall  motion, trivial AI, mild LAE, PASP 38 // echo 8/18: EF 60-65, normal wall motion, grade 1 diastolic dysfunction, calcified aortic valve leaflets, mild aortic stenosis (mean 13, peak 28, LVOT/AV mean velocity 0.51), mild AI, moderate MAC, mild LAE, trivial TR   . Complication of anesthesia   . Diabetes mellitus Dx 1989  . Hepatitis C Dx 2013  . Hypertension Dx 1989  . Obesity   . Pancreatitis 2013  . Refusal of blood transfusions as patient is Jehovah's Witness   . Tendinitis   . Ulcer 2010    PAST SURGICAL HISTORY:   Past Surgical History:  Procedure Laterality Date  . APPLICATION OF WOUND VAC Left 08/14/2017   Procedure: APPLICATION OF WOUND VAC Exchange;  Surgeon: Robert Bellow, MD;  Location: [facility];  Service: General;  Laterality: Left;  . CHOLECYSTECTOMY    . CORONARY STENT INTERVENTION N/A 09/18/2016   Procedure: Coronary Stent Intervention;  Surgeon: Troy Sine, MD;  Location: [place] [facility];  Service: Cardiovascular;  Laterality: N/A;  . DRESSING CHANGE UNDER ANESTHESIA Left 08/15/2017   Procedure: exploration of wound for bleeding;  Surgeon: Robert Bellow, MD;  Location: [facility];  Service: General;  Laterality: Left;  . EYE SURGERY    . INCISION AND DRAINAGE ABSCESS Left 08/12/2017   Procedure: INCISION AND DRAINAGE ABSCESS;  Surgeon: Robert Bellow,  MD;  Location: [facility];  Service: General;  Laterality: Left;  . KNEE ARTHROSCOPY    . LEFT HEART CATH N/A 09/18/2016   Procedure: Left Heart Cath;  Surgeon: Troy Sine, MD;  Location: [place] [facility];  Service: Cardiovascular;  Laterality: N/A;  . LEFT HEART CATH AND CORONARY ANGIOGRAPHY N/A 09/16/2016   Procedure: Left Heart Cath and Coronary Angiography;  Surgeon: Burnell Blanks, MD;  Location: [place] [facility];  Service: Cardiovascular;  Laterality: N/A;  . LEFT HEART CATH AND CORONARY ANGIOGRAPHY N/A 04/29/2017   Procedure: LEFT HEART CATH AND CORONARY ANGIOGRAPHY;  Surgeon: Nelva Bush,  MD;  Location: [place] [facility];  Service: Cardiovascular;  Laterality: N/A;  . TUBAL LIGATION    . TUBAL LIGATION      SOCIAL HISTORY:   Social History   Tobacco Use  . Smoking status: Former Smoker    Last attempt to quit: 10/25/1980    Years since quitting: 36.9  . Smokeless tobacco: Never Used  . Tobacco comment: quit smoking in 1982  Substance Use Topics  . Alcohol use: Yes    Comment: 3 times in last year    FAMILY HISTORY:   Family History  Problem Relation Age of Onset  . Colon cancer Mother   . Heart attack Other   . Heart attack Maternal Grandmother   . Hypertension Sister   . Hypertension Brother   . Diabetes Paternal Grandmother   . Breast cancer Neg Hx     DRUG ALLERGIES:   Allergies  Allergen Reactions  . Shellfish Allergy Anaphylaxis and Swelling  . Diazepam Other (See Comments)    "felt like out of body experience"  . Morphine And Related Itching    REVIEW OF SYSTEMS:   Review of Systems  Constitutional: Negative for chills, fever and weight loss.  HENT: Negative for nosebleeds and sore throat.   Eyes: Negative for blurred vision.  Respiratory: Positive for cough and shortness of breath. Negative for wheezing.   Cardiovascular: Negative for chest pain, orthopnea, leg swelling and PND.  Gastrointestinal: Negative for abdominal pain, constipation, diarrhea, heartburn, nausea and vomiting.  Genitourinary: Negative for dysuria and urgency.  Musculoskeletal: Negative for back pain.  Skin: Negative for rash.  Neurological: Negative for dizziness, speech change, focal weakness and headaches.  Endo/Heme/Allergies: Does not bruise/bleed easily.  Psychiatric/Behavioral: Negative for depression.   MEDICATIONS AT HOME:   Prior to Admission medications   Medication Sig Start Date End Date Taking? Authorizing Provider  albuterol (PROVENTIL) (2.5 MG/3ML) 0.083% nebulizer solution Take 3 mLs (2.5 mg total) by nebulization every 6 (six) hours as needed  for wheezing or shortness of breath. 09/01/17  Yes [name], PA-C  amLODipine (NORVASC) 10 MG tablet Take 1 tablet (10 mg total) by mouth daily. 04/14/17  Yes Charlott Rakes, MD  aspirin EC 81 MG EC tablet Take 1 tablet (81 mg total) by mouth daily. 09/19/16  Yes Strader, [place] M, PA-C  atorvastatin (LIPITOR) 80 MG tablet Take 1 tablet (80 mg total) by mouth daily at 6 PM. 02/12/17  Yes Funches, Josalyn, MD  carvedilol (COREG) 25 MG tablet TAKE 1 TABLET BY MOUTH 2 TIMES DAILY WITH A MEAL. 02/12/17  Yes Funches, Josalyn, MD  Exenatide ER (BYDUREON BCISE) 2 MG/0.85ML AUIJ Inject 2 mg into the skin once a week. 09/01/17  Yes [name], PA-C  ferrous sulfate 325 (65 FE) MG tablet Take 1 tablet (325 mg total) by mouth 2 (two) times daily  with a meal. 04/14/17  Yes Newlin, Enobong, MD  fluticasone (FLONASE) 50 MCG/ACT nasal spray Place 2 sprays into both nostrils daily as needed for allergies or rhinitis. 01/07/17  Yes Funches, Josalyn, MD  furosemide (LASIX) 40 MG tablet Take 1 tablet (40 mg total) by mouth every other day. 09/01/17 09/01/18 Yes [name], PA-C  hydrALAZINE (APRESOLINE) 50 MG tablet Take 1 tablet (50 mg total) by mouth 3 (three) times daily. 02/12/17  Yes Funches, Josalyn, MD  insulin aspart (NOVOLOG) 100 UNIT/ML FlexPen Inject 15 Units into the skin 3 (three) times daily with meals. 08/19/17  Yes [name], MD  Insulin Glargine (LANTUS SOLOSTAR) 100 UNIT/ML Solostar Pen Inject 48 Units into the skin 2 (two) times daily. 08/19/17  Yes [name], MD  isosorbide mononitrate (IMDUR) 60 MG 24 hr tablet Take 1 tablet (60 mg total) by mouth daily. 05/05/17  Yes Simmons, Brittainy M, PA-C  MITIGARE 0.6 MG CAPS Take 1 capsule by mouth daily. 06/30/17  Yes Charlott Rakes, MD  omeprazole (PRILOSEC) 20 MG capsule Take 1 capsule (20 mg total) by mouth daily. 04/14/17  Yes Charlott Rakes, MD  pregabalin (LYRICA) 100 MG capsule Take 1 capsule (100 mg total) by mouth 3 (three)  times daily. 09/01/17  Yes [name], PA-C  ticagrelor (BRILINTA) 90 MG TABS tablet Take 1 tablet (90 mg total) by mouth 2 (two) times daily. 09/07/17  Yes [name], PA-C  Vitamin D, Ergocalciferol, (DRISDOL) 50000 units CAPS capsule Take 1 capsule (50,000 Units total) by mouth every 30 (thirty) days. 09/01/17  Yes [name], PA-C  albuterol (VENTOLIN HFA) 108 (90 Base) MCG/ACT inhaler Inhale 2 puffs into the lungs every 4 (four) hours as needed for wheezing or shortness of breath. 09/01/17   [name], PA-C  allopurinol (ZYLOPRIM) 100 MG tablet Take 1 tablet (100 mg total) by mouth daily. 09/01/17   [name], PA-C  amoxicillin-clavulanate (AUGMENTIN) 875-125 MG tablet Take 1 tablet by mouth 2 (two) times daily. 09/09/17   [name], PA-C  Blood Glucose Monitoring Suppl (ACCU-CHEK AVIVA) device Use as instructed daily. 04/14/17   Charlott Rakes, MD  docusate sodium (COLACE) 100 MG capsule Take 1 capsule (100 mg total) every 12 (twelve) hours by mouth. Patient not taking: Reported on 08/20/2017 05/25/17   Jola Schmidt, MD  Fluticasone-Salmeterol (ADVAIR DISKUS) 250-50 MCG/DOSE AEPB Inhale 1 puff into the lungs 2 (two) times daily. 09/01/17   [name], PA-C  glucose blood (ACCU-CHEK AVIVA) test strip Use as instructed daily 04/14/17   Charlott Rakes, MD  glucose blood (TRUE METRIX BLOOD GLUCOSE TEST) test strip Use as instructed 03/19/17   Barrett, Evelene Croon, PA-C  hydrOXYzine (ATARAX/VISTARIL) 50 MG tablet Take 1 tablet (50 mg total) by mouth 3 (three) times daily as needed for itching. 08/04/17   Charlott Rakes, MD  ketotifen (ZADITOR) 0.025 % ophthalmic solution Place 1 drop into both eyes daily as needed (for dry eyes). 09/01/17   [name], PA-C  Lancet Devices [facility]) lancets Use as instructed daily. 04/14/17   Charlott Rakes, MD  nitroGLYCERIN (NITROSTAT) 0.4 MG SL tablet Place 1 tablet (0.4 mg total)  under the tongue every 5 (five) minutes x 3 doses as needed for chest pain. 03/19/17   Barrett, Evelene Croon, PA-C  oxyCODONE-acetaminophen (PERCOCET/ROXICET) 5-325 MG tablet Take 1 tablet by mouth every 6 (six) hours as needed for severe pain. 09/01/17   [name], PA-C  valACYclovir Estell Harpin)  500 MG tablet Take 1 tablet (500 mg total) by mouth daily. 04/14/17   Charlott Rakes, MD  gabapentin (NEURONTIN) 100 MG capsule Take 1 capsule (100 mg total) by mouth 3 (three) times daily. 08/19/17 09/01/17  [name], MD      VITAL SIGNS:  Blood pressure (!) 162/66, pulse 92, temperature 98.5 F (36.9 C), temperature source Axillary, resp. rate (!) 22, height _0  (1.727 m), weight 136.1 kg (300 lb), SpO2 99 %.  PHYSICAL EXAMINATION:  Physical Exam  GENERAL:  62 y.o.-year-old patient lying in the bed with no acute distress. Anasarca + EYES: Pupils equal, round, reactive to light and accommodation. No scleral icterus. Extraocular muscles intact.  HEENT: Head atraumatic, normocephalic. Oropharynx and nasopharynx clear.  NECK:  Supple, no jugular venous distention. No thyroid enlargement, no tenderness.  LUNGS: Decreased breath sounds bilaterally, no wheezing, + rales,No rhonchi or crepitation. No use of accessory muscles of respiration.  CARDIOVASCULAR: S1, S2 normal. No murmurs, rubs, or gallops.  ABDOMEN: Soft, nontender, nondistended. Bowel sounds present. No organomegaly or mass.  EXTREMITIES: No pedal edema, cyanosis, or clubbing.  NEUROLOGIC: Cranial nerves II through XII are intact. Muscle strength 5/5 in all extremities. Sensation intact. Gait not checked.  PSYCHIATRIC: The patient is alert and oriented x 3.  SKIN: No obvious rash, lesion, or ulcer.  LABORATORY PANEL:   CBC Recent Labs  Lab 09/14/17 0710  WBC 10.7  HGB 9.1*  HCT 27.8*  PLT 272   ------------------------------------------------------------------------------------------------------------------  Chemistries    Recent Labs  Lab 09/14/17 0706 09/14/17 0710  NA  --  139  K  --  4.4  CL  --  109  CO2  --  17*  GLUCOSE  --  245*  BUN  --  80*  CREATININE  --  2.89*  CALCIUM  --  8.0*  AST 32  --   ALT 52  --   ALKPHOS 291*  --   BILITOT 0.7  --    ------------------------------------------------------------------------------------------------------------------  Cardiac Enzymes Recent Labs  Lab 09/14/17 1635  TROPONINI <0.03   ------------------------------------------------------------------------------------------------------------------  RADIOLOGY:  US Renal  Result Date: 09/14/2017 CLINICAL DATA:  Shortness of breath for 2 days.  Oliguria. EXAM: RENAL / URINARY TRACT ULTRASOUND COMPLETE COMPARISON:  Renal ultrasound 03/19/2017. FINDINGS: Right Kidney: Length: 11.8 cm. Cortical echogenicity is increased. No hydronephrosis. Two small cysts are identified measuring approximately 1 cm in diameter. Left Kidney: Length: 11.2 cm. Cortical echogenicity is increased. No hydronephrosis. Single simple cyst measures 2.4 cm in diameter. Bladder: Appears normal for degree of bladder distention. IMPRESSION: Negative for hydronephrosis.  No acute abnormality. Increased cortical echogenicity bilaterally consistent with medical renal disease. Electronically Signed   By: Inge Rise M.D.   On: 09/14/2017 09:39   Dg Chest Port 1 View  Result Date: 09/14/2017 CLINICAL DATA:  Shortness of Breath EXAM: PORTABLE CHEST 1 VIEW COMPARISON:  09/04/2017 FINDINGS: Cardiomegaly. Diffuse bilateral airspace opacities are new since prior study. No visible effusions. No acute bony abnormality. IMPRESSION: Diffuse bilateral airspace opacities could reflect edema/CHF or infection. Electronically Signed   By: Rolm Baptise M.D.   On: 09/14/2017 07:36   IMPRESSION AND PLAN:  62 y.o. female with a hx of CAD, mild aortic stenosis, CKD stage IV, anemia of chronic kidney disease, DM, HTN, HLD, hepatitis C, morbid obesity, and  OSA admitted for acute hypoxic resp failure  * Acute hypoxic respiratory failure: -Likely due to malignant HTN with worsening CKD 4, CHF, Anasarca - HD Decision per  Nephro -BiPAP per PCCM -Check echo  * Acute on chronic diastolic CHF: - Cardio c/s (d/w Dr End) -IV Lasix - but not having much response (150 cc neg) -Echo  -Daily weights and strict Is and Os  * Acute on CKD stage IV: not much response to Diuresis - may be needing HD now (ESRD?) - Nephro c/s   * CAD: -ASA, lipitor and Brilinta -Cardio c/s  * Malignant Hypertension: -BP slowly improving on nitro gtt, titrate as needed - continue Amlodipine,Hydralazine,Imdur -Not on beta blocker currently given respiratory status        All the records are reviewed and case discussed with ED provider. Management plans discussed with the patient, family (daughter at bedside), Dr Alva Garnet and they are in agreement.  CODE STATUS: FULL CODE  TOTAL TIME TAKING CARE OF THIS PATIENT: 45 minutes.    Max Sane M.D on 09/14/2017 at 5:07 PM  Between 7am to 6pm - Pager - [phone]  After 6pm go to [email or web address] - password [email or web address] [facility]  [facility]  Office  [phone]  CC: Primary care physician; [name], PA-C   Note: This dictation was prepared with Dragon dictation along with smaller phrase technology. Any transcriptional errors that result from this process are unintentional.

## 2017-09-14 NOTE — ED Notes (Signed)
Pt reports some relief in respiratory effort.

## 2017-09-14 NOTE — Telephone Encounter (Signed)
Pt was rushed to hospital this afternoon and pt's daughter wanted to inform the provider.  No need to call back

## 2017-09-14 NOTE — ED Provider Notes (Addendum)
[facility] Emergency Department Provider Note       Time seen: ----------------------------------------- 7:13 AM on 09/14/2017 -----------------------------------------   I have reviewed the triage vital signs and the nursing notes.  HISTORY   Chief Complaint Respiratory Distress   HPI [name] is a 62 y.o. female with a history of aortic stenosis, asthma, bronchitis, CHF, diabetes who presents to the ED for shortness of breath for at least the past 2 days.  Patient states she has had a cough but cannot actually cough anything up.  She is felt this way similarly in the past when she has had pneumonia.  EMS arrived to find her hypoxic and she was placed on CPAP.  Rales were noted throughout her lung fields by EMS.  She was also noted to be very edematous diffusely.  She is currently being treated with antibiotics for a wound on her left upper extremity which also has a wound VAC.  She arrived on CPAP with oxygen saturations of 85%.  Patient states she feels like the oxygen is helping at this time.  Past Medical History:  Diagnosis Date  . Aortic stenosis    Echo 8/18: mean 13, peak 28, LVOT/AV mean velocity 0.51  . Asthma    As a child   . Bronchitis   . CAD (coronary artery disease)    a. 09/2016: 50% Ost 1st Mrg stenosis, 50% 2nd Mrg stenosis, 20% Mid-Cx, 95% Prox LAD, 40% mid-LAD, and 10% dist-LAD stenosis. Staged PCI with DES to Prox-LAD.   [name] Chronic combined systolic and diastolic CHF (congestive heart failure) ([place]) 2011   echo 2/18: EF 55-60, normal wall motion, grade 2 diastolic dysfunction, trivial AI // echo 3/18: Septal and apical HK, EF 45-50, normal wall motion, trivial AI, mild LAE, PASP 38 // echo 8/18: EF 60-65, normal wall motion, grade 1 diastolic dysfunction, calcified aortic valve leaflets, mild aortic stenosis (mean 13, peak 28, LVOT/AV mean velocity 0.51), mild AI, moderate MAC, mild LAE, trivial TR   . Complication of anesthesia   .  Diabetes mellitus Dx 1989  . Hepatitis C Dx 2013  . Hypertension Dx 1989  . Obesity   . Pancreatitis 2013  . Refusal of blood transfusions as patient is Jehovah's Witness   . Tendinitis   . Ulcer 2010    Patient Active Problem List   Diagnosis Date Noted  . Abscess of left thigh 06/02/2017  . Acute metabolic encephalopathy 25/95/6387  . Chest pain 04/29/2017  . Chest pain with moderate risk of acute coronary syndrome 04/28/2017  . Acute on chronic combined systolic and diastolic CHF, NYHA class 4 ([place]) 03/19/2017  . Unstable angina ([place]) 03/17/2017  . Aortic stenosis   . Herpes simplex infection of genitourinary system 02/12/2017  . Chronic pain of right knee 02/12/2017  . Chronic gout due to renal impairment of multiple sites without tophus 02/12/2017  . Esophageal dysphagia 02/12/2017  . Osteoarthritis 01/22/2017  . Diabetic hyperosmolar non-ketotic state ([place]) 12/13/2016  . CAD (coronary artery disease) 12/13/2016  . Hyperlipidemia 12/13/2016  . Hyperphosphatemia 12/13/2016  . History of non-ST elevation myocardial infarction (NSTEMI) 09/13/2016  . Acute myopericarditis   . Injury of left hand 02/24/2016  . Asthma 11/29/2015  . Trigger middle finger 11/25/2015  . Depression 09/05/2015  . GERD (gastroesophageal reflux disease) 03/25/2015  . Precordial pain   . Environmental allergies 03/14/2015  . Hep C w/o coma, chronic ([place]) 01/31/2015  . Diabetic neuropathy ([place]) 01/31/2015  . OSA (obstructive  sleep apnea) 01/01/2015  . Poor dentition 11/13/2014  . Essential hypertension 10/08/2014  . Obesity 10/08/2014  . CKD (chronic kidney disease) stage 4, GFR 15-29 ml/min (HCC) 10/08/2014  . Diabetes mellitus type 2, uncontrolled, with complications ([place]) 16/04/9603    Class: Chronic  . Anemia in stage 3 chronic kidney disease ([place]) 03/25/2012  . Chronic combined systolic and diastolic CHF (congestive heart failure) ([place]) 07/13/2009    Past Surgical History:  Procedure  Laterality Date  . APPLICATION OF WOUND VAC Left 08/14/2017   Procedure: APPLICATION OF WOUND VAC Exchange;  Surgeon: Robert Bellow, MD;  Location: [facility] ORS;  Service: General;  Laterality: Left;  . CHOLECYSTECTOMY    . CORONARY STENT INTERVENTION N/A 09/18/2016   Procedure: Coronary Stent Intervention;  Surgeon: Troy Sine, MD;  Location: [place] CV LAB;  Service: Cardiovascular;  Laterality: N/A;  . DRESSING CHANGE UNDER ANESTHESIA Left 08/15/2017   Procedure: exploration of wound for bleeding;  Surgeon: Robert Bellow, MD;  Location: [facility] ORS;  Service: General;  Laterality: Left;  . EYE SURGERY    . INCISION AND DRAINAGE ABSCESS Left 08/12/2017   Procedure: INCISION AND DRAINAGE ABSCESS;  Surgeon: Robert Bellow, MD;  Location: [facility] ORS;  Service: General;  Laterality: Left;  . KNEE ARTHROSCOPY    . LEFT HEART CATH N/A 09/18/2016   Procedure: Left Heart Cath;  Surgeon: Troy Sine, MD;  Location: [place] CV LAB;  Service: Cardiovascular;  Laterality: N/A;  . LEFT HEART CATH AND CORONARY ANGIOGRAPHY N/A 09/16/2016   Procedure: Left Heart Cath and Coronary Angiography;  Surgeon: Burnell Blanks, MD;  Location: [place] CV LAB;  Service: Cardiovascular;  Laterality: N/A;  . LEFT HEART CATH AND CORONARY ANGIOGRAPHY N/A 04/29/2017   Procedure: LEFT HEART CATH AND CORONARY ANGIOGRAPHY;  Surgeon: Nelva Bush, MD;  Location: [place] CV LAB;  Service: Cardiovascular;  Laterality: N/A;  . TUBAL LIGATION    . TUBAL LIGATION      Allergies Shellfish allergy; Diazepam; and Morphine and related  Social History Social History   Tobacco Use  . Smoking status: Former Smoker    Last attempt to quit: 10/25/1980    Years since quitting: 36.9  . Smokeless tobacco: Never Used  . Tobacco comment: quit smoking in 1982  Substance Use Topics  . Alcohol use: Yes    Comment: 3 times in last year  . Drug use: No    Comment: 08/21/2016 "clean since 05/1998"    Review  of Systems Constitutional: Negative for fever. Eyes: Negative for vision changes ENT: Positive for facial swelling Cardiovascular: Negative for chest pain. Respiratory: Positive for shortness of breath, cough Gastrointestinal: Negative for abdominal pain, vomiting and diarrhea. Musculoskeletal: Negative for back pain.  Positive for peripheral edema Skin: Negative for rash. Neurological: Negative for headaches, positive for generalized weakness  All systems negative/normal/unremarkable except as stated in the HPI  ____________________________________________   PHYSICAL EXAM:  VITAL SIGNS: ED Triage Vitals  Enc Vitals Group     BP --      Pulse --      Resp --      Temp --      Temp src --      SpO2 09/14/17 0708 (!) 85 %     Weight 09/14/17 0709 300 lb (136.1 kg)     Height 09/14/17 0709 _0  (1.727 m)     Head Circumference --      Peak Flow --  Pain Score --      Pain Loc --      Pain Edu? --      Excl. in [place]? --     Constitutional: Alert and oriented.  Moderate distress Eyes: Conjunctivae are normal. Normal extraocular movements.  Periorbital edema bilaterally ENT   Head: Normocephalic and atraumatic.  Diffuse facial edema   Nose: No congestion/rhinnorhea.   Mouth/Throat: Mucous membranes are moist.   Neck: No stridor. Cardiovascular: Rapid rate, regular rhythm. No murmurs, rubs, or gallops. Respiratory: Tachypnea with rales bilaterally diffusely Gastrointestinal: Soft and nontender. Normal bowel sounds Musculoskeletal: Nontender with normal range of motion in extremities.  Bilateral pitting edema is noted, left upper extremity wound VAC in place Neurologic:  Normal speech and language. No gross focal neurologic deficits are appreciated.  Skin:  Skin is warm, dry and intact. No rash noted. Psychiatric: Anxious mood and affect ____________________________________________  EKG: Interpreted by me.  Sinus tachycardia with a rate of 103 bpm, normal  PR interval, normal axis, normal QT  ____________________________________________  ED COURSE:  As part of my medical decision making, I reviewed the following data within the [place] History obtained from family if available, nursing notes, old chart and ekg, as well as notes from prior ED visits. Patient presented for respiratory distress, we will assess with labs and imaging as indicated at this time. Clinical Course as of Sep 14 837  Tue Sep 14, 2017  0821 BP is improved to 163/117 on NTG drip  [JW]    Clinical Course User Index [JW] Earleen Newport, MD   Procedures ____________________________________________   LABS (pertinent positives/negatives)  Labs Reviewed  CBC WITH DIFFERENTIAL/PLATELET - Abnormal; Notable for the following components:      Result Value   RBC 3.26 (*)    Hemoglobin 9.1 (*)    HCT 27.8 (*)    RDW 18.9 (*)    Neutro Abs 8.3 (*)    Monocytes Absolute 1.0 (*)    All other components within normal limits  BASIC METABOLIC PANEL - Abnormal; Notable for the following components:   CO2 17 (*)    Glucose, Bld 245 (*)    BUN 80 (*)    Creatinine, Ser 2.89 (*)    Calcium 8.0 (*)    GFR calc non Af Amer 17 (*)    GFR calc Af Amer 19 (*)    All other components within normal limits  BLOOD GAS, VENOUS - Abnormal; Notable for the following components:   pCO2, Ven 38 (*)    pO2, Ven 46.0 (*)    All other components within normal limits  TROPONIN I  BRAIN NATRIURETIC PEPTIDE  FIBRIN DERIVATIVES D-DIMER ([facility] ONLY)   CRITICAL CARE Performed by: Laurence Aly   Total critical care time: 30 minutes  Critical care time was exclusive of separately billable procedures and treating other patients.  Critical care was necessary to treat or prevent imminent or life-threatening deterioration.  Critical care was time spent personally by me on the following activities: development of treatment plan with patient and/or surrogate as  well as nursing, discussions with consultants, evaluation of patient's response to treatment, examination of patient, obtaining history from patient or surrogate, ordering and performing treatments and interventions, ordering and review of laboratory studies, ordering and review of radiographic studies, pulse oximetry and re-evaluation of patient's condition.  RADIOLOGY Images were viewed by me  Chest x-[place] IMPRESSION: Diffuse bilateral airspace opacities could reflect edema/CHF or infection.  ____________________________________________  DIFFERENTIAL DIAGNOSIS   CHF, anasarca, sepsis, pneumonia, allergic reaction  FINAL ASSESSMENT AND PLAN  Acute respiratory failure with hypoxia, pulmonary edema, hypertension   Plan: The patient had presented for shortness of breath and hypoxia. Patient's labs revealed borderline acidemia, chronic kidney disease but no other acute process. Patient's imaging appear to reflect bilateral edema.  I do not think that she has pneumonia at this time.  We placed her on BiPAP and gave her nitroglycerin via IV infusion with improvement in her blood pressure.  Currently she is improving in terms of her shortness of breath.  She was also given IV Lasix but has not diuresed well.  I discussed with nephrology, we will obtain a renal ultrasound and then start continuous IV Lasix infusion if she is still not urinating at that time.  I will discussed with the hospitalist for ICU admission.   Laurence Aly, MD   Note: This note was generated in part or whole with voice recognition software. Voice recognition is usually quite accurate but there are transcription errors that can and very often do occur. I apologize for any typographical errors that were not detected and corrected.     Earleen Newport, MD 09/14/17 0840    Earleen Newport, MD 09/14/17 1624    Earleen Newport, MD 09/30/17 2257

## 2017-09-14 NOTE — Care Management (Addendum)
Patient going to ICU SDU for Bipap. This patient is currently followed by [facility] nurse.  Recent wound vac per note. RNCM to follow. [place] approved by Dr. Doy Hutching PA- Corene Cornea updated with [facility].

## 2017-09-14 NOTE — ED Notes (Signed)
Pt very sleepy. Does wake to voice but reports feels more drowsy than normal.  Repeat vbg sent.

## 2017-09-14 NOTE — ED Notes (Signed)
Pt sleeping.  Remains on bipap.  NAD at this time. Remains on full cardiac monitor. Waiting on stepdown bed.

## 2017-09-14 NOTE — ED Notes (Signed)
Per dr Jimmye Norman ok to trial off bipap. RT notified.  Placed on 2 L . Pt verbalized understanding to call if breathing worsens. Will continue to monitor.

## 2017-09-15 ENCOUNTER — Inpatient Hospital Stay: Payer: Medicaid Other

## 2017-09-15 ENCOUNTER — Inpatient Hospital Stay (HOSPITAL_COMMUNITY)
Admit: 2017-09-15 | Discharge: 2017-09-15 | Disposition: A | Discharge status: Home / Self Care | Payer: Medicaid Other | Attending: Internal Medicine | Admitting: Internal Medicine

## 2017-09-15 DIAGNOSIS — R0600 Dyspnea, unspecified: Secondary | ICD-10-CM

## 2017-09-15 DIAGNOSIS — I2510 Atherosclerotic heart disease of native coronary artery without angina pectoris: Secondary | ICD-10-CM

## 2017-09-15 LAB — BASIC METABOLIC PANEL
Anion gap: 13 (ref 5–15)
Anion gap: 10 (ref 5–15)
BUN: 90 mg/dL — ABNORMAL HIGH (ref 6–20)
BUN: 93 mg/dL — ABNORMAL HIGH (ref 6–20)
CO2: 16 mmol/L — ABNORMAL LOW (ref 22–32)
CO2: 18 mmol/L — ABNORMAL LOW (ref 22–32)
Calcium: 8 mg/dL — ABNORMAL LOW (ref 8.9–10.3)
Calcium: 8 mg/dL — ABNORMAL LOW (ref 8.9–10.3)
Chloride: 106 mmol/L (ref 101–111)
Chloride: 106 mmol/L (ref 101–111)
Creatinine, Ser: 2.86 mg/dL — ABNORMAL HIGH (ref 0.44–1.00)
Creatinine, Ser: 2.83 mg/dL — ABNORMAL HIGH (ref 0.44–1.00)
GFR calc Af Amer: 20 mL/min — ABNORMAL LOW (ref >60)
GFR calc Af Amer: 20 mL/min — ABNORMAL LOW (ref >60)
GFR calc non Af Amer: 17 mL/min — ABNORMAL LOW (ref >60)
GFR calc non Af Amer: 17 mL/min — ABNORMAL LOW (ref >60)
Glucose, Bld: 387 mg/dL — ABNORMAL HIGH (ref 65–99)
Glucose, Bld: 393 mg/dL — ABNORMAL HIGH (ref 65–99)
Potassium: 4.3 mmol/L (ref 3.5–5.1)
Potassium: 4.3 mmol/L (ref 3.5–5.1)
Sodium: 135 mmol/L (ref 135–145)
Sodium: 134 mmol/L — ABNORMAL LOW (ref 135–145)

## 2017-09-15 LAB — URINALYSIS, COMPLETE (UACMP) WITH MICROSCOPIC
Bacteria, UA: NONE SEEN
Bilirubin Urine: NEGATIVE
Glucose, UA: NEGATIVE mg/dL
Hgb urine dipstick: NEGATIVE
Ketones, ur: NEGATIVE mg/dL
Leukocytes, UA: NEGATIVE
Nitrite: NEGATIVE
Protein, ur: 30 mg/dL — AB
Specific Gravity, Urine: 1.011 (ref 1.005–1.030)
pH: 5 (ref 5.0–8.0)

## 2017-09-15 LAB — URINE DRUG SCREEN, QUALITATIVE (ARMC ONLY)
Amphetamines, Ur Screen: NOT DETECTED
Barbiturates, Ur Screen: NOT DETECTED
Benzodiazepine, Ur Scrn: NOT DETECTED
Cannabinoid 50 Ng, Ur ~~LOC~~: NOT DETECTED
Cocaine Metabolite,Ur ~~LOC~~: NOT DETECTED
MDMA (Ecstasy)Ur Screen: NOT DETECTED
Methadone Scn, Ur: NOT DETECTED
Opiate, Ur Screen: NOT DETECTED
Phencyclidine (PCP) Ur S: NOT DETECTED
Tricyclic, Ur Screen: NOT DETECTED

## 2017-09-15 LAB — CBC WITH DIFFERENTIAL/PLATELET
Basophils Absolute: 0 10*3/uL (ref 0–0.1)
Basophils Relative: 1 %
Eosinophils Absolute: 0 10*3/uL (ref 0–0.7)
Eosinophils Relative: 0 %
HCT: 24.7 % — ABNORMAL LOW (ref 35.0–47.0)
Hemoglobin: 7.9 g/dL — ABNORMAL LOW (ref 12.0–16.0)
Lymphocytes Relative: 8 %
Lymphs Abs: 0.6 10*3/uL — ABNORMAL LOW (ref 1.0–3.6)
MCH: 27.2 pg (ref 26.0–34.0)
MCHC: 32 g/dL (ref 32.0–36.0)
MCV: 85.2 fL (ref 80.0–100.0)
Monocytes Absolute: 0.6 10*3/uL (ref 0.2–0.9)
Monocytes Relative: 7 %
Neutro Abs: 6.4 10*3/uL (ref 1.4–6.5)
Neutrophils Relative %: 84 %
Platelets: 208 10*3/uL (ref 150–440)
RBC: 2.9 MIL/uL — ABNORMAL LOW (ref 3.80–5.20)
RDW: 18.9 % — ABNORMAL HIGH (ref 11.5–14.5)
WBC: 7.6 10*3/uL (ref 3.6–11.0)

## 2017-09-15 LAB — GLUCOSE, CAPILLARY
Glucose-Capillary: 417 mg/dL — ABNORMAL HIGH (ref 65–99)
Glucose-Capillary: 355 mg/dL — ABNORMAL HIGH (ref 65–99)
Glucose-Capillary: 345 mg/dL — ABNORMAL HIGH (ref 65–99)
Glucose-Capillary: 313 mg/dL — ABNORMAL HIGH (ref 65–99)
Glucose-Capillary: 254 mg/dL — ABNORMAL HIGH (ref 65–99)
Glucose-Capillary: 303 mg/dL — ABNORMAL HIGH (ref 65–99)
Glucose-Capillary: 224 mg/dL — ABNORMAL HIGH (ref 65–99)
Glucose-Capillary: 215 mg/dL — ABNORMAL HIGH (ref 65–99)
Glucose-Capillary: 166 mg/dL — ABNORMAL HIGH (ref 65–99)
Glucose-Capillary: 159 mg/dL — ABNORMAL HIGH (ref 65–99)
Glucose-Capillary: 191 mg/dL — ABNORMAL HIGH (ref 65–99)

## 2017-09-15 LAB — PROCALCITONIN: Procalcitonin: 0.64 ng/mL

## 2017-09-15 LAB — TROPONIN I: Troponin I: <0.03 ng/mL (ref <0.03)

## 2017-09-15 LAB — ECHOCARDIOGRAM COMPLETE
Height: 68 in
Weight: 5220.49 oz

## 2017-09-15 MED ORDER — FAMOTIDINE 20 MG PO TABS
20.0000 mg | ORAL_TABLET | Freq: Every day | ORAL | Status: DC
  Administered 2017-09-15 – 2017-09-16 (×2): 20 mg via ORAL
  Filled 2017-09-15 (×2): qty 1

## 2017-09-15 MED ORDER — OXYCODONE-ACETAMINOPHEN 5-325 MG PO TABS
1.0000 | ORAL_TABLET | Freq: Four times a day (QID) | ORAL | Status: DC | PRN
  Administered 2017-09-16: 1 via ORAL
  Administered 2017-09-17 – 2017-09-19 (×3): 2 via ORAL
  Filled 2017-09-15 (×3): qty 2
  Filled 2017-09-15: qty 1

## 2017-09-15 MED ORDER — FUROSEMIDE 10 MG/ML IJ SOLN
5.0000 mg/h | INTRAVENOUS | Status: DC
  Administered 2017-09-15 – 2017-09-17 (×2): 5 mg/h via INTRAVENOUS
  Filled 2017-09-15 (×2): qty 25

## 2017-09-15 MED ORDER — BUDESONIDE 0.5 MG/2ML IN SUSP
0.5000 mg | Freq: Two times a day (BID) | RESPIRATORY_TRACT | Status: DC
  Administered 2017-09-15 – 2017-09-20 (×10): 0.5 mg via RESPIRATORY_TRACT
  Filled 2017-09-15 (×10): qty 2

## 2017-09-15 MED ORDER — OXYCODONE-ACETAMINOPHEN 5-325 MG PO TABS
1.0000 | ORAL_TABLET | Freq: Four times a day (QID) | ORAL | Status: DC | PRN
  Administered 2017-09-15: 1 via ORAL
  Filled 2017-09-15: qty 1

## 2017-09-15 MED ORDER — OXYCODONE-ACETAMINOPHEN 5-325 MG PO TABS
1.0000 | ORAL_TABLET | Freq: Once | ORAL | Status: AC
  Administered 2017-09-15: 1 via ORAL
  Filled 2017-09-15: qty 1

## 2017-09-15 MED ORDER — SODIUM CHLORIDE 0.9 % IV SOLN
INTRAVENOUS | Status: DC
  Administered 2017-09-15: 3 [IU]/h via INTRAVENOUS
  Administered 2017-09-15: 9.2 [IU]/h via INTRAVENOUS
  Administered 2017-09-15: 4.8 [IU]/h via INTRAVENOUS
  Filled 2017-09-15 (×3): qty 1

## 2017-09-15 MED ORDER — HYDRALAZINE HCL 50 MG PO TABS
50.0000 mg | ORAL_TABLET | Freq: Three times a day (TID) | ORAL | Status: DC
  Administered 2017-09-15: 50 mg via ORAL
  Filled 2017-09-15: qty 1

## 2017-09-15 MED ORDER — PREGABALIN 50 MG PO CAPS
100.0000 mg | ORAL_CAPSULE | Freq: Three times a day (TID) | ORAL | Status: DC
  Administered 2017-09-15 – 2017-09-20 (×14): 100 mg via ORAL
  Filled 2017-09-15 (×4): qty 2
  Filled 2017-09-15: qty 1
  Filled 2017-09-15 (×4): qty 2
  Filled 2017-09-15: qty 1
  Filled 2017-09-15 (×3): qty 2
  Filled 2017-09-15: qty 1

## 2017-09-15 NOTE — Progress Notes (Signed)
Progress Note  Patient Name: [name] Date of Encounter: 09/15/2017  Primary Cardiologist: Burt Knack  Subjective   BP continues to improve, into the 878M systolic now. Remains on BiPAP. Weights likely inaccurate. Documented net - 862 mL for the admission. Cardiac enzymes negative. No chest pain.   Inpatient Medications    Scheduled Meds: . allopurinol  100 mg Oral Daily  . amLODipine  10 mg Oral Daily  . aspirin EC  81 mg Oral Daily  . atorvastatin  80 mg Oral q1800  . colchicine  0.6 mg Oral Daily  . docusate sodium  100 mg Oral Q12H  . ferrous sulfate  325 mg Oral BID WC  . heparin  5,000 Units Subcutaneous Q8H  . hydrALAZINE  50 mg Oral TID  . insulin aspart  0-20 Units Subcutaneous TID WC  . insulin aspart  0-5 Units Subcutaneous QHS  . insulin aspart  15 Units Subcutaneous TID WC  . insulin glargine  48 Units Subcutaneous BID  . ipratropium-albuterol  3 mL Nebulization Q6H  . isosorbide mononitrate  60 mg Oral Daily  . mometasone-formoterol  2 puff Inhalation BID  . pantoprazole  40 mg Oral Daily  . pregabalin  100 mg Oral TID  . ticagrelor  90 mg Oral BID  . valACYclovir  500 mg Oral Daily  . [START ON 10/11/2017] Vitamin D (Ergocalciferol)  50,000 Units Oral Q30 days   Continuous Infusions: . sodium chloride    . famotidine (PEPCID) IV    . furosemide (LASIX) infusion    . nitroGLYCERIN 25 mcg/min (09/14/17 2108)   PRN Meds: sodium chloride, albuterol, fluticasone, hydrALAZINE, hydrOXYzine, hydrOXYzine, ketotifen, nitroGLYCERIN   Vital Signs    Vitals:   09/15/17 0430 09/15/17 0500 09/15/17 0553 09/15/17 0600  BP: (!) 155/80 (!) 147/78 (!) 157/77 (!) 155/90  Pulse: 90 88 90 86  Resp: (!) 24 17 (!) 23 (!) 21  Temp:      TempSrc:      SpO2: 98% 96% 98% 99%  Weight:      Height:        Intake/Output Summary (Last 24 hours) at 09/15/2017 0946 Last data filed at 09/15/2017 0600 Gross per 24 hour  Intake 1187.95 ml  Output 2050 ml  Net -862.05 ml    Filed Weights   09/14/17 0709 09/14/17 1600 09/15/17 0332  Weight: 300 lb (136.1 kg) (!) 320 lb 5.3 oz (145.3 kg) (!) 326 lb 4.5 oz (148 kg)    Telemetry    NSR - Personally Reviewed  ECG    n/a - Personally Reviewed  Physical Exam   GEN: Ill appearing; No acute distress.   Neck: JVD mildly elevated. Cardiac: RRR, II/VI systolic murmur, no rubs, or gallops.  Respiratory: Diminished breath sounds bilaterally, on BiPAP.  GI: Soft, nontender, non-distended.   MS: 1-2+ pitting edema; No deformity. Neuro:  Alert and oriented x 3; Nonfocal.  Psych: Normal affect.  Labs    Chemistry Recent Labs  Lab 09/14/17 0706 09/14/17 0710 09/14/17 2312 09/15/17 0618  NA  --  139 135 134*  K  --  4.4 4.3 4.3  CL  --  109 106 106  CO2  --  17* 16* 18*  GLUCOSE  --  245* 387* 393*  BUN  --  80* 90* 93*  CREATININE  --  2.89* 2.86* 2.83*  CALCIUM  --  8.0* 8.0* 8.0*  PROT 7.6  --   --   --  ALBUMIN 3.7  --   --   --   AST 32  --   --   --   ALT 52  --   --   --   ALKPHOS 291*  --   --   --   BILITOT 0.7  --   --   --   GFRNONAA  --  17* 17* 17*  GFRAA  --  19* 20* 20*  ANIONGAP  --  13 13 10      Hematology Recent Labs  Lab 09/14/17 0710 09/14/17 2312 09/15/17 0618  WBC 10.7 7.4 7.6  RBC 3.26* 2.99* 2.90*  HGB 9.1* 8.3* 7.9*  HCT 27.8* 25.6* 24.7*  MCV 85.4 85.6 85.2  MCH 28.0 27.7 27.2  MCHC 32.8 32.3 32.0  RDW 18.9* 19.1* 18.9*  PLT 272 207 208    Cardiac Enzymes Recent Labs  Lab 09/14/17 0710 09/14/17 1104 09/14/17 1635 09/14/17 2312  TROPONINI <0.03 <0.03 <0.03 <0.03   No results for input(s): TROPIPOC in the last 168 hours.   BNP Recent Labs  Lab 09/14/17 0710  BNP 195.0*     DDimer No results for input(s): DDIMER in the last 168 hours.   Radiology    US Renal  Result Date: 09/14/2017 IMPRESSION: Negative for hydronephrosis.  No acute abnormality. Increased cortical echogenicity bilaterally consistent with medical renal disease.  Electronically Signed   By: Inge Rise M.D.   On: 09/14/2017 09:39   Dg Chest Port 1 View  Result Date: 09/15/2017 IMPRESSION: 1. Overall improved aeration since prior radiograph with mild residual ground-glass opacity bilaterally, likely resolving edema. 2. Cardiomegaly Electronically Signed   By: Donavan Foil M.D.   On: 09/15/2017 03:53   Dg Chest Port 1 View  Result Date: 09/14/2017 IMPRESSION: Diffuse bilateral airspace opacities could reflect edema/CHF or infection. Electronically Signed   By: Rolm Baptise M.D.   On: 09/14/2017 07:36    Cardiac Studies   LHC 04/2017: Coronary Findings   Diagnostic  Dominance: Left  Left Anterior Descending  Prox LAD to Mid LAD lesion 95% stenosed  The lesion was previously treated using a drug eluting stent between 6-12 months ago. Previously placed stent displays no restenosis.  Mid LAD lesion 40% stenosed  Mid LAD lesion.  Dist LAD lesion 10% stenosed  Dist LAD lesion.  First Diagonal Branch  Vessel is moderate in size.  First Septal Branch  Vessel is small in size.  Second Diagonal Branch  Vessel is small in size.  Ost 2nd Diag lesion 60% stenosed  Ost 2nd Diag lesion.  Second Septal Branch  Vessel is small in size.  Third Diagonal Branch  Vessel is small in size.  Left Circumflex  Mid Cx lesion 20% stenosed  Mid Cx lesion.  First Obtuse Marginal Branch  Vessel is moderate in size.  Ost 1st Mrg to 1st Mrg lesion 60% stenosed  Ost 1st Mrg to 1st Mrg lesion.  Second Obtuse Marginal Branch  Vessel is moderate in size.  Ost 2nd Mrg to 2nd Mrg lesion 50% stenosed  Ost 2nd Mrg to 2nd Mrg lesion.  Third Obtuse Marginal Branch  Vessel is moderate in size.  Fourth Obtuse Marginal Branch  Vessel is small in size.  Left Posterior Descending Artery  Vessel is moderate in size.  Right Coronary Artery  Vessel is small.  Intervention   No interventions have been documented.  Left Heart   Left Ventricle LV end diastolic  pressure is normal.  Coronary Diagrams  Diagnostic Diagram        Conclusion   Conclusions: 1. Widely patent mid LAD stent. 2. No significant change in mild to moderate LCx, OM, and diagonal disease compared with 09/2016. 3. Normal left ventricular filling pressure.  Recommendations: 1. Continue medical therapy and secondary prevention. I will restart isosorbide mononitrate at 90 mg daily with hope of weaning off NTG infusion later today. 2. Gentle hydration given CKD and normal LVEDP. Hold furosemide today and reassess volume status and renal function tomorrow before restarting diuretic therapy.    TTE 02/2017: Study Conclusions  - Left ventricle: The cavity size was normal. Wall thickness was normal. Systolic function was normal. The estimated ejection fraction was in the range of 60% to 65%. Wall motion was normal; there were no regional wall motion abnormalities. Doppler parameters are consistent with abnormal left ventricular relaxation (grade 1 diastolic dysfunction). - Aortic valve: Trileaflet; mildly thickened, mildly calcified leaflets. There was mild stenosis. There was mild regurgitation. Peak velocity (S): 265 cm/s. Mean gradient (S): 13 mm Hg. Valve area (VTI): 1.82 cm^2. Valve area (Vmax): 1.25 cm^2. Valve area (Vmean): 1.45 cm^2. - Mitral valve: Moderately calcified annulus. - Left atrium: The atrium was mildly dilated. - Tricuspid valve: There was trivial regurgitation.   TTE pending.   Patient Profile     62 y.o. female with history of CAD as detailed below, HFpEF, mild aortic stenosis, CKD stage IV (baseline ~ 2), anemia of chronic disease, DM, HTN, HLD, hepatitis C, morbid obesity, and OSA who is being seen today for the evaluation of SOB.  Assessment & Plan    1. Acute respiratory failure with hypoxia: -Likely in the setting of her hypertensive heart disease with acute on chronic diastolic CHF with possible PNA -BiPAP per  PCCM -Check echo -D dimer negative  2. Acute on chronic diastolic CHF: -Remains significantly volume overloaded -IV Lasix with KCl repletion and close monitoring of renal function -Echo pending -CHF education -Daily weights and strict Is and Os -Not on spironolactone given CKD  3. CAD: -No chest pain -ASA and Brilinta -Echo pending -Lipitor  4. Hypertensive heart disease: -Exacerbated her CHF -BP slowly improving on nitro gtt, titrate as needed -Amlodipine/hydralazine/Imdur -Not on beta blocker currently given respiratory status  5. Acute on CKD stage IV: -Stable -Monitor with diuresis   6. Anemia of chronic disease: -Recommend transfusion to goal HGB > 8.5 -Per IM  7. Diabetes: -Poorly controlled -Per IM  8. Morbid obesity/OSA/possible OHS: -BiPAP as above   For questions or updates, please contact [place] Please consult [email or web address] for contact info under Cardiology/STEMI.    Signed, Christell Faith, PA-C [place] Pager: [phone] 09/15/2017, 9:46 AM

## 2017-09-15 NOTE — Progress Notes (Signed)
*  PRELIMINARY RESULTS* Echocardiogram 2D Echocardiogram has been performed.  [name] Char Muriel Wilber 09/15/2017, 9:17 AM

## 2017-09-15 NOTE — Consult Note (Signed)
[facility] Nurse wound consult note Reason for Consult: Wound care recommendations for chronic wound on left thigh.Patient admitted with NPWT device from home. Wound type: Infectious.  Patient is s/p I&D for abscess in November 2018. Pressure Injury POA: NA Measurement: 4cm x 7.5cm x 2cm (deepest depth in center) Wound bed: Pale pink, moist. Hypergranulation tissue noted at wound periphery from 8-2 o'clock from excessive moisture accumulation. Drainage (amount, consistency, odor): Moderate amount of serous to light yellow exudate Periwound: Intact with evidence of previous wound healing (scarring) Dressing procedure/placement/frequency: I will suggest discontinuing negative pressure wound therapy for a minimum of 2 weeks in favor of daily wound care using a silver hydrofiber (Aquacel Ag+) for both the donation of the antimicrobial property found in silver and for return of moisture balance to this wound bed/eliminate hypergranulation at the periphery as noted above. NPWT may be reinstated upon return to home. If hypergranulation is not yet at skin level, recommend using foam rather than gauze wound interface layer. Dressing cannister is discarded and pump placed near patient belongings for transport home or return to [place].  [place] nursing team will not follow routinely, but will remain available to this patient, the nursing and medical teams.  Please re-consult if needed. Thanks, Maudie Flakes, MSN, RN, [place], Arther Abbott  Pager# [phone]

## 2017-09-15 NOTE — Progress Notes (Signed)
[name] NAME: [name]    MR#:  [id number]  DATE OF BIRTH:  1955/11/16  SUBJECTIVE:  CHIEF COMPLAINT:   Chief Complaint  Patient presents with  . Respiratory Distress  sob improving.  REVIEW OF SYSTEMS:  Review of Systems  Constitutional: Negative for chills, fever and weight loss.  HENT: Negative for nosebleeds and sore throat.   Eyes: Negative for blurred vision.  Respiratory: Positive for shortness of breath. Negative for cough and wheezing.   Cardiovascular: Negative for chest pain, orthopnea, leg swelling and PND.  Gastrointestinal: Negative for abdominal pain, constipation, diarrhea, heartburn, nausea and vomiting.  Genitourinary: Negative for dysuria and urgency.  Musculoskeletal: Negative for back pain.  Skin: Negative for rash.  Neurological: Negative for dizziness, speech change, focal weakness and headaches.  Endo/Heme/Allergies: Does not bruise/bleed easily.  Psychiatric/Behavioral: Negative for depression.    DRUG ALLERGIES:   Allergies  Allergen Reactions  . Shellfish Allergy Anaphylaxis and Swelling  . Diazepam Other (See Comments)    "felt like out of body experience"  . Morphine And Related Itching   VITALS:  Blood pressure (!) 173/69, pulse 90, temperature 98 F (36.7 C), temperature source Oral, resp. rate 19, height 5\' 8"  (1.727 m), weight (!) 148 kg (326 lb 4.5 oz), SpO2 97 %. PHYSICAL EXAMINATION:  Physical Exam  Constitutional: She is oriented to person, place, and time and well-developed, well-nourished, and in no distress.  HENT:  Head: Normocephalic and atraumatic.  Eyes: Conjunctivae and EOM are normal. Pupils are equal, round, and reactive to light.  Neck: Normal range of motion. Neck supple. No tracheal deviation present. No thyromegaly present.  Cardiovascular: Normal rate, regular rhythm and normal heart sounds.  Pulmonary/Chest: Effort normal and breath sounds normal. No  respiratory distress. She has no wheezes. She exhibits no tenderness.  Abdominal: Soft. Bowel sounds are normal. She exhibits no distension. There is no tenderness.  Musculoskeletal: Normal range of motion. She exhibits edema.  Neurological: She is alert and oriented to person, place, and time. No cranial nerve deficit.  Skin: Skin is warm and dry. No rash noted.  Psychiatric: Mood and affect normal.   LABORATORY PANEL:  Female CBC Recent Labs  Lab 09/15/17 0618  WBC 7.6  HGB 7.9*  HCT 24.7*  PLT 208   ------------------------------------------------------------------------------------------------------------------ Chemistries  Recent Labs  Lab 09/14/17 0706  09/15/17 0618  NA  --    < > 134*  K  --    < > 4.3  CL  --    < > 106  CO2  --    < > 18*  GLUCOSE  --    < > 393*  BUN  --    < > 93*  CREATININE  --    < > 2.83*  CALCIUM  --    < > 8.0*  AST 32  --   --   ALT 52  --   --   ALKPHOS 291*  --   --   BILITOT 0.7  --   --    < > = values in this interval not displayed.   RADIOLOGY:  Dg Chest Port 1 View  Result Date: 09/15/2017 CLINICAL DATA:  Respiratory failure EXAM: PORTABLE CHEST 1 VIEW COMPARISON:  09/14/2017, 07/15/2017 FINDINGS: Cardiomegaly. Subtle bilateral ground-glass opacities. No pleural effusion. No pneumothorax. IMPRESSION: 1. Overall improved aeration since prior radiograph with mild residual ground-glass opacity bilaterally, likely resolving edema. 2. Cardiomegaly Electronically  Signed   By: Donavan Foil M.D.   On: 09/15/2017 03:53   ASSESSMENT AND PLAN:  62 y.o.femalewith a hx of CAD, mild aortic stenosis, CKD stage IV, anemia of chronic kidney disease, DM, HTN, HLD, hepatitis C, morbid obesity, and OSAadmitted for acute hypoxic resp failure  *Acute hypoxic respiratory failure: -Likely due to malignant HTN with worsening CKD 4, CHF, Anasarca - HD Decision per Nephro  * Acute on chronic diastolic CHF - Cardio following - Lasix drip per  nephro and cardio -Echo pending -Daily weights and strict Is and Os - neg 1.4 liters  * Acute on CKD stage III: not much response to Diuresis - may be needing HD now (ESRD?) - Nephro following  * CAD: -ASA, lipitor and Brilinta -Cardio following  * Malignant Hypertension: - BP slowly improving on nitro gtt, titrate as needed - continue Amlodipine,Hydralazine,Imdur -Not on beta blocker currently given respiratory status       All the records are reviewed and case discussed with Care Management/Social Worker. Management plans discussed with the patient, family and they are in agreement.  CODE STATUS: Full Code  TOTAL TIME TAKING CARE OF THIS PATIENT: 15 minutes.   More than 50% of the time was spent in counseling/coordination of care: YES    Max Sane M.D on 09/15/2017 at 4:32 PM  Between 7am to 6pm - Pager - [phone]  After 6pm go to [email or web address] - password [email or web address] [facility]  [facility]  Office  [phone]  CC: Primary care physician; [name], PA-C  Note: This dictation was prepared with Dragon dictation along with smaller phrase technology. Any transcriptional errors that result from this process are unintentional.

## 2017-09-15 NOTE — Progress Notes (Signed)
Notified MD Kasa of elevated CBG of 417. Ordered to treat with highest amount of sliding scale insulin and monitor.

## 2017-09-15 NOTE — Progress Notes (Signed)
[facility]  ROUNDING NOTE   Subjective:  Patient well known to [place] from previous admission. Presents now with increasing shortness of breath and lower extremity edema.  She has known CKD, baseline Cr 1.95, egfr 35.   Renal function significantly worse at the moment, egfr down to 20. Echocardiogram repeated today and EF is normal.    Objective:  Vital signs in last 24 hours:  Temp:  [97.6 F (36.4 C)-98.6 F (37 C)] 98.6 F (37 C) (03/06 0830) Pulse Rate:  [84-108] 87 (03/06 1100) Resp:  [13-27] 23 (03/06 1100) BP: (121-183)/(53-144) 153/75 (03/06 1100) SpO2:  [92 %-100 %] 97 % (03/06 1100) FiO2 (%):  [35 %] 35 % (03/06 0330) Weight:  [145.3 kg (320 lb 5.3 oz)-148 kg (326 lb 4.5 oz)] 148 kg (326 lb 4.5 oz) (03/06 0332)  Weight change:  Filed Weights   09/14/17 0709 09/14/17 1600 09/15/17 0332  Weight: 136.1 kg (300 lb) (!) 145.3 kg (320 lb 5.3 oz) (!) 148 kg (326 lb 4.5 oz)    Intake/Output: I/O last 3 completed shifts: In: 1188 [P.O.:1060; I.V.:128] Out: 2050 [Urine:2050]   Intake/Output this shift:  Total I/O In: 32.4 [I.V.:32.4] Out: 0   Physical Exam: General: No acute distress  Head: Normocephalic, atraumatic. Moist oral mucosal membranes  Eyes: Anicteric  Neck: Supple, trachea midline  Lungs:  Basilar rales, normal effort  Heart: S1S2 no rubs  Abdomen:  Soft, nontender, bowel sounds present  Extremities: 2+ peripheral edema.  Neurologic: Awake, alert, following commands  Skin: No lesions       Basic Metabolic Panel: Recent Labs  Lab 09/14/17 0710 09/14/17 2312 09/15/17 0618  NA 139 135 134*  K 4.4 4.3 4.3  CL 109 106 106  CO2 17* 16* 18*  GLUCOSE 245* 387* 393*  BUN 80* 90* 93*  CREATININE 2.89* 2.86* 2.83*  CALCIUM 8.0* 8.0* 8.0*    Liver Function Tests: Recent Labs  Lab 09/14/17 0706  AST 32  ALT 52  ALKPHOS 291*  BILITOT 0.7  PROT 7.6  ALBUMIN 3.7   No results for input(s): LIPASE, AMYLASE in the last 168  hours. No results for input(s): AMMONIA in the last 168 hours.  CBC: Recent Labs  Lab 09/14/17 0710 09/14/17 2312 09/15/17 0618  WBC 10.7 7.4 7.6  NEUTROABS 8.3* 6.8* 6.4  HGB 9.1* 8.3* 7.9*  HCT 27.8* 25.6* 24.7*  MCV 85.4 85.6 85.2  PLT 272 207 208    Cardiac Enzymes: Recent Labs  Lab 09/14/17 0710 09/14/17 1104 09/14/17 1635 09/14/17 2312  TROPONINI <0.03 <0.03 <0.03 <0.03    BNP: Invalid input(s): POCBNP  CBG: Recent Labs  Lab 09/14/17 1542 09/14/17 2128 09/15/17 0716  GLUCAP 316* 341* 417*    Microbiology: Results for orders placed or performed during the hospital encounter of 09/14/17  MRSA PCR Screening     Status: None   Collection Time: 09/14/17  4:40 PM  Result Value Ref Range Status   MRSA by PCR NEGATIVE NEGATIVE Final    Comment:        The GeneXpert MRSA Assay (FDA approved for NASAL specimens only), is one component of a comprehensive MRSA colonization surveillance program. It is not intended to diagnose MRSA infection nor to guide or monitor treatment for MRSA infections. Performed at [facility], [address] [place], [place] [address]   CULTURE, BLOOD (ROUTINE X 2) w Reflex to ID Panel     Status: None (Preliminary result)   Collection Time: 09/14/17  11:11 PM  Result Value Ref Range Status   Specimen Description BLOOD LT [facility]  Final   Special Requests   Final    BOTTLES DRAWN AEROBIC AND ANAEROBIC Blood Culture adequate volume   Culture   Final    NO GROWTH < 12 HOURS Performed at [facility], [address], [place], [place] [address]    Report Status PENDING  Incomplete  CULTURE, BLOOD (ROUTINE X 2) w Reflex to ID Panel     Status: None (Preliminary result)   Collection Time: 09/14/17 11:37 PM  Result Value Ref Range Status   Specimen Description BLOOD RT ARM  Final   Special Requests   Final    BOTTLES DRAWN AEROBIC AND ANAEROBIC Blood Culture results may not be optimal due to an inadequate volume of  blood received in culture bottles   Culture   Final    NO GROWTH < 12 HOURS Performed at [facility], [address], [place], [place] [address]    Report Status PENDING  Incomplete    Coagulation Studies: Recent Labs    09/14/17 0706  LABPROT 12.9  INR 0.98    Urinalysis: Recent Labs    09/15/17 0318  COLORURINE YELLOW*  LABSPEC 1.011  PHURINE 5.0  GLUCOSEU NEGATIVE  HGBUR NEGATIVE  BILIRUBINUR NEGATIVE  KETONESUR NEGATIVE  PROTEINUR 30*  NITRITE NEGATIVE  LEUKOCYTESUR NEGATIVE      Imaging: US Renal  Result Date: 09/14/2017 CLINICAL DATA:  Shortness of breath for 2 days.  Oliguria. EXAM: RENAL / URINARY TRACT ULTRASOUND COMPLETE COMPARISON:  Renal ultrasound 03/19/2017. FINDINGS: Right Kidney: Length: 11.8 cm. Cortical echogenicity is increased. No hydronephrosis. Two small cysts are identified measuring approximately 1 cm in diameter. Left Kidney: Length: 11.2 cm. Cortical echogenicity is increased. No hydronephrosis. Single simple cyst measures 2.4 cm in diameter. Bladder: Appears normal for degree of bladder distention. IMPRESSION: Negative for hydronephrosis.  No acute abnormality. Increased cortical echogenicity bilaterally consistent with medical renal disease. Electronically Signed   By: Inge Rise M.D.   On: 09/14/2017 09:39   Dg Chest Port 1 View  Result Date: 09/15/2017 CLINICAL DATA:  Respiratory failure EXAM: PORTABLE CHEST 1 VIEW COMPARISON:  09/14/2017, 07/15/2017 FINDINGS: Cardiomegaly. Subtle bilateral ground-glass opacities. No pleural effusion. No pneumothorax. IMPRESSION: 1. Overall improved aeration since prior radiograph with mild residual ground-glass opacity bilaterally, likely resolving edema. 2. Cardiomegaly Electronically Signed   By: Donavan Foil M.D.   On: 09/15/2017 03:53   Dg Chest Port 1 View  Result Date: 09/14/2017 CLINICAL DATA:  Shortness of Breath EXAM: PORTABLE CHEST 1 VIEW COMPARISON:  09/04/2017 FINDINGS:  Cardiomegaly. Diffuse bilateral airspace opacities are new since prior study. No visible effusions. No acute bony abnormality. IMPRESSION: Diffuse bilateral airspace opacities could reflect edema/CHF or infection. Electronically Signed   By: Rolm Baptise M.D.   On: 09/14/2017 07:36     Medications:   . sodium chloride    . famotidine (PEPCID) IV    . furosemide (LASIX) infusion 5 mg/hr (09/15/17 1034)  . insulin (NOVOLIN-R) infusion    . nitroGLYCERIN 15 mcg/min (09/15/17 1034)   . allopurinol  100 mg Oral Daily  . amLODipine  10 mg Oral Daily  . aspirin EC  81 mg Oral Daily  . atorvastatin  80 mg Oral q1800  . budesonide (PULMICORT) nebulizer solution  0.5 mg Nebulization BID  . colchicine  0.6 mg Oral Daily  . docusate sodium  100 mg Oral Q12H  . ferrous sulfate  325 mg Oral BID WC  . heparin  5,000 Units Subcutaneous Q8H  . hydrALAZINE  50 mg Oral TID  . ipratropium-albuterol  3 mL Nebulization Q6H  . isosorbide mononitrate  60 mg Oral Daily  . pantoprazole  40 mg Oral Daily  . pregabalin  100 mg Oral TID  . ticagrelor  90 mg Oral BID  . valACYclovir  500 mg Oral Daily  . [START ON 10/11/2017] Vitamin D (Ergocalciferol)  50,000 Units Oral Q30 days   sodium chloride, albuterol, fluticasone, hydrALAZINE, hydrOXYzine, hydrOXYzine, ketotifen, nitroGLYCERIN  Assessment/ Plan:  62 y.o. female  African-American female with chronic kidney disease stage III followed by Dr. Arty Baumgartner, [facility], hypertension, coronary artery disease, congestive heart failure with diastolic dysfunction, diabetes mellitus type II, hepatitis C chronic, peptic ulcer disease, obstructive sleep apnea  1.  Acute renal failure on chronic kidney disease stage III baseline creatinine 1.95 2.  Peripheral edema. 3.  Hypertension, severe upon admission. 4.  Acute respiratory failure, now off of BiPAP.  Plan: Patient is well known to was from prior admission.patient admitted now with progressive  shortness of breath and edema.  BUN currently up to 93 with a creatinine of 2.8.  We'll transition the patient to Lasix drip at 5 mg per hour.  If she does not have an adequate diuretic response to this we may need to increase the dosage.  Continue to monitor renal parameters closely.  She is high risk for further deterioration of her renal function givenLasix usage.  We plan to check renal ultrasound to make sure there is no underlying obstruction.  Continue blood pressure control with amlodipine, hydralazine, isosorbide mononitrate.   LOS: 1 [name] 3/6/20191:08 PM

## 2017-09-16 DIAGNOSIS — I5033 Acute on chronic diastolic (congestive) heart failure: Secondary | ICD-10-CM

## 2017-09-16 DIAGNOSIS — I161 Hypertensive emergency: Secondary | ICD-10-CM

## 2017-09-16 LAB — GLUCOSE, CAPILLARY
Glucose-Capillary: 121 mg/dL — ABNORMAL HIGH (ref 65–99)
Glucose-Capillary: 124 mg/dL — ABNORMAL HIGH (ref 65–99)
Glucose-Capillary: 126 mg/dL — ABNORMAL HIGH (ref 65–99)
Glucose-Capillary: 134 mg/dL — ABNORMAL HIGH (ref 65–99)
Glucose-Capillary: 136 mg/dL — ABNORMAL HIGH (ref 65–99)
Glucose-Capillary: 136 mg/dL — ABNORMAL HIGH (ref 65–99)
Glucose-Capillary: 138 mg/dL — ABNORMAL HIGH (ref 65–99)
Glucose-Capillary: 141 mg/dL — ABNORMAL HIGH (ref 65–99)
Glucose-Capillary: 141 mg/dL — ABNORMAL HIGH (ref 65–99)
Glucose-Capillary: 156 mg/dL — ABNORMAL HIGH (ref 65–99)
Glucose-Capillary: 177 mg/dL — ABNORMAL HIGH (ref 65–99)
Glucose-Capillary: 210 mg/dL — ABNORMAL HIGH (ref 65–99)
Glucose-Capillary: 212 mg/dL — ABNORMAL HIGH (ref 65–99)
Glucose-Capillary: 218 mg/dL — ABNORMAL HIGH (ref 65–99)

## 2017-09-16 LAB — BASIC METABOLIC PANEL
Anion gap: 13 (ref 5–15)
BUN: 104 mg/dL — ABNORMAL HIGH (ref 6–20)
CO2: 18 mmol/L — ABNORMAL LOW (ref 22–32)
Calcium: 8.1 mg/dL — ABNORMAL LOW (ref 8.9–10.3)
Chloride: 107 mmol/L (ref 101–111)
Creatinine, Ser: 3.03 mg/dL — ABNORMAL HIGH (ref 0.44–1.00)
GFR calc Af Amer: 18 mL/min — ABNORMAL LOW (ref >60)
GFR calc non Af Amer: 16 mL/min — ABNORMAL LOW (ref >60)
Glucose, Bld: 147 mg/dL — ABNORMAL HIGH (ref 65–99)
Potassium: 3.9 mmol/L (ref 3.5–5.1)
Sodium: 138 mmol/L (ref 135–145)

## 2017-09-16 LAB — CBC WITH DIFFERENTIAL/PLATELET
Basophils Absolute: 0 10*3/uL (ref 0–0.1)
Basophils Relative: 1 %
Eosinophils Absolute: 0.1 10*3/uL (ref 0–0.7)
Eosinophils Relative: 2 %
HCT: 25 % — ABNORMAL LOW (ref 35.0–47.0)
Hemoglobin: 8 g/dL — ABNORMAL LOW (ref 12.0–16.0)
Lymphocytes Relative: 23 %
Lymphs Abs: 1.9 10*3/uL (ref 1.0–3.6)
MCH: 27.4 pg (ref 26.0–34.0)
MCHC: 32 g/dL (ref 32.0–36.0)
MCV: 85.7 fL (ref 80.0–100.0)
Monocytes Absolute: 1.3 10*3/uL — ABNORMAL HIGH (ref 0.2–0.9)
Monocytes Relative: 16 %
Neutro Abs: 5.1 10*3/uL (ref 1.4–6.5)
Neutrophils Relative %: 60 %
Platelets: 233 10*3/uL (ref 150–440)
RBC: 2.91 MIL/uL — ABNORMAL LOW (ref 3.80–5.20)
RDW: 18.7 % — ABNORMAL HIGH (ref 11.5–14.5)
WBC: 8.5 10*3/uL (ref 3.6–11.0)

## 2017-09-16 LAB — URINE CULTURE: Culture: NO GROWTH

## 2017-09-16 LAB — HIV ANTIBODY (ROUTINE TESTING W REFLEX): HIV Screen 4th Generation wRfx: NONREACTIVE

## 2017-09-16 MED ORDER — CARVEDILOL 3.125 MG PO TABS
3.1250 mg | ORAL_TABLET | Freq: Two times a day (BID) | ORAL | Status: DC
  Administered 2017-09-16 – 2017-09-17 (×3): 3.125 mg via ORAL
  Filled 2017-09-16 (×3): qty 1

## 2017-09-16 MED ORDER — INSULIN GLARGINE 100 UNIT/ML ~~LOC~~ SOLN
45.0000 [IU] | Freq: Two times a day (BID) | SUBCUTANEOUS | Status: DC
  Administered 2017-09-16 – 2017-09-20 (×9): 45 [IU] via SUBCUTANEOUS
  Filled 2017-09-16 (×10): qty 0.45

## 2017-09-16 MED ORDER — INSULIN ASPART 100 UNIT/ML ~~LOC~~ SOLN
0.0000 [IU] | Freq: Three times a day (TID) | SUBCUTANEOUS | Status: DC
  Administered 2017-09-16 (×2): 2 [IU] via SUBCUTANEOUS
  Administered 2017-09-17: 3 [IU] via SUBCUTANEOUS
  Administered 2017-09-17: 2 [IU] via SUBCUTANEOUS
  Administered 2017-09-18: 10 [IU] via SUBCUTANEOUS
  Administered 2017-09-18: 8 [IU] via SUBCUTANEOUS
  Administered 2017-09-18 – 2017-09-19 (×4): 3 [IU] via SUBCUTANEOUS
  Administered 2017-09-20: 1 [IU] via SUBCUTANEOUS
  Filled 2017-09-16 (×11): qty 1

## 2017-09-16 MED ORDER — INSULIN ASPART 100 UNIT/ML ~~LOC~~ SOLN
0.0000 [IU] | Freq: Every day | SUBCUTANEOUS | Status: DC
  Administered 2017-09-18: 2 [IU] via SUBCUTANEOUS
  Filled 2017-09-16: qty 1

## 2017-09-16 MED ORDER — INSULIN ASPART 100 UNIT/ML ~~LOC~~ SOLN
10.0000 [IU] | Freq: Three times a day (TID) | SUBCUTANEOUS | Status: DC
  Administered 2017-09-16 – 2017-09-19 (×11): 10 [IU] via SUBCUTANEOUS
  Filled 2017-09-16 (×11): qty 1

## 2017-09-16 MED ORDER — ISOSORBIDE MONONITRATE ER 60 MG PO TB24
90.0000 mg | ORAL_TABLET | Freq: Every day | ORAL | Status: DC
  Administered 2017-09-16 – 2017-09-20 (×5): 90 mg via ORAL
  Filled 2017-09-16 (×4): qty 1
  Filled 2017-09-16: qty 3

## 2017-09-16 MED ORDER — HYDRALAZINE HCL 50 MG PO TABS
100.0000 mg | ORAL_TABLET | Freq: Three times a day (TID) | ORAL | Status: DC
  Administered 2017-09-16 – 2017-09-17 (×4): 100 mg via ORAL
  Filled 2017-09-16 (×4): qty 2

## 2017-09-16 NOTE — Progress Notes (Signed)
Inpatient Diabetes Program Recommendations  AACE/ADA: New Consensus Statement on Inpatient Glycemic Control (2015)  Target Ranges:  Prepandial:   less than 140 mg/dL      Peak postprandial:   less than 180 mg/dL (1-2 hours)      Critically ill patients:  140 - 180 mg/dL   Results for [name], [name] (MRN [id number]) as of 09/16/2017 09:10  Ref. Range 09/16/2017 00:35 09/16/2017 02:11 09/16/2017 03:08 09/16/2017 05:09 09/16/2017 06:24 09/16/2017 07:30 09/16/2017 08:32  Glucose-Capillary Latest Ref Range: 65 - 99 mg/dL 212 (H) 138 (H) 124 (H) 141 (H) 136 (H) 136 (H) 177 (H)   Review of Glycemic Control  Diabetes history: DM2 Outpatient Diabetes medications: Bydureon 2 mg weekly, Lantus 48 units BID, Novolog 15 units TID with meals Current orders for Inpatient glycemic control: ICU Glycemic Control; phase 2 IV insulin drip  Inpatient Diabetes Program Recommendations:  Insulin - Basal: At time of transition from IV to SQ insulin, please consider ordering Lantus 45 units BID. Correction (SSI): At time of transition from IV to SQ insulin, please consider ordering CBGs with Novolog 0-15 units TID with meals and Novolog 0-5 units QHS.  Insulin - Meal Coverage: At time of transition from IV to SQ insulin, please consider ordering Novolog 10 units TID with meals if patient eats at least 50% of meal.  NOTE: [place], RN paged Diabetes Coordinator for transition recommendations. Patient is currently on IV insulin per ICU Glycemic control Phase 2. Patient is no longer on steroids, off BiPAP, and eating diet. Therefore, would not recommend going to Phase 3 of the ICU order set. Instead recommend ordering Lantus 45 units BID, Novolog 10 units TID with meals for meal coverage, Novolog 0-15 units TID with meals, and Novolog 0-5 units QHS.  Thanks, Barnie Alderman, RN, MSN, CDE Diabetes Coordinator Inpatient Diabetes Program [phone] (Team Pager from 8am to 5pm)

## 2017-09-16 NOTE — Progress Notes (Signed)
Patient moved to room 241 by wheelchair with this RN.  Alert with no distress noted.  95% on RA after arriving to patient's room.  Patient does not want to use oxygen if she doesn't need it.  Aniceto Boss, RN and Verdis Frederickson, NT at bedside with patient.

## 2017-09-16 NOTE — Consult Note (Signed)
   Name: TEIGHLOR KORSON MRN: [id number] DOB: [birth year]    ADMISSION DATE:  09/14/2017 CONSULTATION DATE: 09/14/2017  REFERRING MD : Dr. Manuella Ghazi   CHIEF COMPLAINT: Shortness of Breath   BRIEF PATIENT DESCRIPTION: 62 yo female admitted with malignant hypertension, acute on chronic renal failure, and acute on chronic hypoxic respiratory failure secondary to CHF exacerbation vs. possible pneumonia requiring Bipap   SIGNIFICANT EVENTS  03/5-Pt admitted to stepdown unit   STUDIES:  Renal US 03/5>>Negative for hydronephrosis.  No acute abnormality. Increased cortical echogenicity bilaterally consistent with medical renal disease.  HISTORY OF PRESENT ILLNESS:   Placed on biPAP yesterday Weaned off today No acute distress Mild SOB No fevers No abd issues  ROS as per HPI Otherwise Negative  VITAL SIGNS: Temp:  [97.6 F (36.4 C)-98.6 F (37 C)] 97.6 F (36.4 C) (03/07 0500) Pulse Rate:  [79-93] 85 (03/07 0600) Resp:  [10-25] 10 (03/07 0600) BP: (126-174)/(53-99) 145/72 (03/07 0600) SpO2:  [94 %-100 %] 98 % (03/07 0600) Weight:  [326 lb 8 oz (148.1 kg)] 326 lb 8 oz (148.1 kg) (03/07 0500)  PHYSICAL EXAMINATION: General: acutely ill appearing female, off biPAP Neuro: lethargic, follows commands, PERRL  HEENT: supple, no JVD  Cardiovascular: nsr, s1s2, no M/R/G  Lungs: crackles throughout, even, non labored  Abdomen: +BS x4, obese, soft, non tender  Musculoskeletal: 2+ generalized edema  Skin: left thigh wound vac with dried drainage   Recent Labs  Lab 09/14/17 2312 09/15/17 0618 09/16/17 0632  NA 135 134* 138  K 4.3 4.3 3.9  CL 106 106 107  CO2 16* 18* 18*  BUN 90* 93* 104*  CREATININE 2.86* 2.83* 3.03*  GLUCOSE 387* 393* 147*   Recent Labs  Lab 09/14/17 2312 09/15/17 0618 09/16/17 0632  HGB 8.3* 7.9* 8.0*  HCT 25.6* 24.7* 25.0*  WBC 7.4 7.6 8.5  PLT 207 208 233    ASSESSMENT / PLAN: Acute on chronic hypoxic respiratory failure secondary to CHF exacerbation  and possible pneumonia  Acute encephalopathy  Malignant HTN Acute on chronic renal failure  Anemia without obvious acute blood loss Left Thigh Wound Vac Hx: Asthma  P: Prn Bipap for encephalopathy, dyspnea, and/or hypoxia  Continue lasix  Continue bronchodilator therapy  Echo-EF 60%, Grade 1 Diastolic dysfunction  Continue po cardiac medications once able to tolerate po's Prn nitroglycerin gtt and hydralazine for bp control  Trend BMP's Replace electrolytes as indicated  Monitor UOP  Subq heparin for VTE prophylaxis  Trend CBC Monitor for s/sx of bleeding  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  [facility]  Medical Director [place] Director [facility] Cardio-Pulmonary Department

## 2017-09-16 NOTE — Progress Notes (Signed)
Report called to Aniceto Boss, RN on 2A.  Patient will be moved to room 241.  Patient is A&Ox4.  Dyspnic with exertion.  Family has visited during shift.

## 2017-09-16 NOTE — Progress Notes (Signed)
Pt is alert and oriented, one complaint of bilateral hand pain relieved with percocet.  NSR on telemetry, lungs diminished, has altered between BiPaP and 3L [place].  Remains on Lasix, Nitroglycerin, and Insulin gtt's.  Pt has had 4 consecutive CBG's <180, 2 more needed to be able to converted off insulin gtt.  Purewick external catheter removed per pt request, pt now up to bedside commode 2 times.  Vital signs stable, afebrile.

## 2017-09-16 NOTE — Progress Notes (Signed)
Per ICU glycemic control protocol patient is ready to be transitioned off insulin drip therefore RN spoke with Diabetes coordinator who placed recommendations and RN asked Dr. Mortimer Fries and MD stated to order lantus, meal coverage and sliding scale insulin per Diabetes coordinators recommendations.

## 2017-09-16 NOTE — Progress Notes (Signed)
Progress Note  Patient Name: [name] Date of Encounter: 09/16/2017  Primary Cardiologist: Burt Knack  Subjective   SOB slightly improved. On nasal cannula this morning. Still feels swollen. No chest pain. Changed to Lasix gtt per nephrology. 1.7 L UOP over the past 24 hours with a net - 2.6 L for the admission. Weight stable at 326 pounds. BMP pending. HGB stable.   Inpatient Medications    Scheduled Meds: . allopurinol  100 mg Oral Daily  . amLODipine  10 mg Oral Daily  . aspirin EC  81 mg Oral Daily  . atorvastatin  80 mg Oral q1800  . budesonide (PULMICORT) nebulizer solution  0.5 mg Nebulization BID  . colchicine  0.6 mg Oral Daily  . docusate sodium  100 mg Oral Q12H  . famotidine  20 mg Oral Daily  . ferrous sulfate  325 mg Oral BID WC  . heparin  5,000 Units Subcutaneous Q8H  . hydrALAZINE  50 mg Oral TID  . ipratropium-albuterol  3 mL Nebulization Q6H  . isosorbide mononitrate  60 mg Oral Daily  . pantoprazole  40 mg Oral Daily  . pregabalin  100 mg Oral TID  . ticagrelor  90 mg Oral BID  . valACYclovir  500 mg Oral Daily  . [START ON 10/11/2017] Vitamin D (Ergocalciferol)  50,000 Units Oral Q30 days   Continuous Infusions: . sodium chloride    . furosemide (LASIX) infusion 5 mg/hr (09/16/17 0630)  . insulin (NOVOLIN-R) infusion 3.1 Units/hr (09/16/17 0630)  . nitroGLYCERIN 15 mcg/min (09/16/17 0630)   PRN Meds: sodium chloride, albuterol, fluticasone, hydrALAZINE, hydrOXYzine, hydrOXYzine, ketotifen, nitroGLYCERIN, oxyCODONE-acetaminophen   Vital Signs    Vitals:   09/16/17 0300 09/16/17 0400 09/16/17 0500 09/16/17 0600  BP: 136/75 (!) 148/73 (!) 152/96 (!) 145/72  Pulse: 81 79 88 85  Resp: 16 10 13 10   Temp:   97.6 F (36.4 C)   TempSrc:   Oral   SpO2: 99% 99% 97% 98%  Weight:   (!) 326 lb 8 oz (148.1 kg)   Height:        Intake/Output Summary (Last 24 hours) at 09/16/2017 0725 Last data filed at 09/16/2017 0512 Gross per 24 hour  Intake 101.97 ml   Output 1875 ml  Net -1773.03 ml   Filed Weights   09/14/17 1600 09/15/17 0332 09/16/17 0500  Weight: (!) 320 lb 5.3 oz (145.3 kg) (!) 326 lb 4.5 oz (148 kg) (!) 326 lb 8 oz (148.1 kg)    Telemetry    NSR - Personally Reviewed  ECG    n/a - Personally Reviewed  Physical Exam   GEN: No acute distress.   Neck: No JVD. Cardiac: RRR, II/Vi systolic murmur, no rubs, or gallops.  Respiratory: Diminished breath sounds bilaterally.  GI: Soft, nontender, non-distended.   MS: 1+ bilateral pitting edema; No deformity. Neuro:  Alert and oriented x 3; Nonfocal.  Psych: Normal affect.  Labs    Chemistry Recent Labs  Lab 09/14/17 0706 09/14/17 0710 09/14/17 2312 09/15/17 0618  NA  --  139 135 134*  K  --  4.4 4.3 4.3  CL  --  109 106 106  CO2  --  17* 16* 18*  GLUCOSE  --  245* 387* 393*  BUN  --  80* 90* 93*  CREATININE  --  2.89* 2.86* 2.83*  CALCIUM  --  8.0* 8.0* 8.0*  PROT 7.6  --   --   --   ALBUMIN  3.7  --   --   --   AST 32  --   --   --   ALT 52  --   --   --   ALKPHOS 291*  --   --   --   BILITOT 0.7  --   --   --   GFRNONAA  --  17* 17* 17*  GFRAA  --  19* 20* 20*  ANIONGAP  --  13 13 10      Hematology Recent Labs  Lab 09/14/17 2312 09/15/17 0618 09/16/17 0632  WBC 7.4 7.6 8.5  RBC 2.99* 2.90* 2.91*  HGB 8.3* 7.9* 8.0*  HCT 25.6* 24.7* 25.0*  MCV 85.6 85.2 85.7  MCH 27.7 27.2 27.4  MCHC 32.3 32.0 32.0  RDW 19.1* 18.9* 18.7*  PLT 207 208 233    Cardiac Enzymes Recent Labs  Lab 09/14/17 0710 09/14/17 1104 09/14/17 1635 09/14/17 2312  TROPONINI <0.03 <0.03 <0.03 <0.03   No results for input(s): TROPIPOC in the last 168 hours.   BNP Recent Labs  Lab 09/14/17 0710  BNP 195.0*     DDimer No results for input(s): DDIMER in the last 168 hours.   Radiology    US Renal  Result Date: 09/14/2017 IMPRESSION: Negative for hydronephrosis.  No acute abnormality. Increased cortical echogenicity bilaterally consistent with medical renal  disease. Electronically Signed   By: Inge Rise M.D.   On: 09/14/2017 09:39   Dg Chest Port 1 View  Result Date: 09/15/2017 IMPRESSION: 1. Overall improved aeration since prior radiograph with mild residual ground-glass opacity bilaterally, likely resolving edema. 2. Cardiomegaly Electronically Signed   By: Donavan Foil M.D.   On: 09/15/2017 03:53   Dg Chest Port 1 View  Result Date: 09/14/2017 IMPRESSION: Diffuse bilateral airspace opacities could reflect edema/CHF or infection. Electronically Signed   By: Rolm Baptise M.D.   On: 09/14/2017 07:36    Cardiac Studies   LHC 04/2017: Coronary Findings   Diagnostic  Dominance: Left  Left Anterior Descending  Prox LAD to Mid LAD lesion 95% stenosed  The lesion was previously treated using a drug eluting stent between 6-12 months ago. Previously placed stent displays no restenosis.  Mid LAD lesion 40% stenosed  Mid LAD lesion.  Dist LAD lesion 10% stenosed  Dist LAD lesion.  First Diagonal Branch  Vessel is moderate in size.  First Septal Branch  Vessel is small in size.  Second Diagonal Branch  Vessel is small in size.  Ost 2nd Diag lesion 60% stenosed  Ost 2nd Diag lesion.  Second Septal Branch  Vessel is small in size.  Third Diagonal Branch  Vessel is small in size.  Left Circumflex  Mid Cx lesion 20% stenosed  Mid Cx lesion.  First Obtuse Marginal Branch  Vessel is moderate in size.  Ost 1st Mrg to 1st Mrg lesion 60% stenosed  Ost 1st Mrg to 1st Mrg lesion.  Second Obtuse Marginal Branch  Vessel is moderate in size.  Ost 2nd Mrg to 2nd Mrg lesion 50% stenosed  Ost 2nd Mrg to 2nd Mrg lesion.  Third Obtuse Marginal Branch  Vessel is moderate in size.  Fourth Obtuse Marginal Branch  Vessel is small in size.  Left Posterior Descending Artery  Vessel is moderate in size.  Right Coronary Artery  Vessel is small.  Intervention   No interventions have been documented.  Left Heart   Left Ventricle LV end  diastolic pressure is normal.  Coronary Diagrams  Diagnostic Diagram        Conclusion   Conclusions: 1. Widely patent mid LAD stent. 2. No significant change in mild to moderate LCx, OM, and diagonal disease compared with 09/2016. 3. Normal left ventricular filling pressure.  Recommendations: 1. Continue medical therapy and secondary prevention. I will restart isosorbide mononitrate at 90 mg daily with hope of weaning off NTG infusion later today. 2. Gentle hydration given CKD and normal LVEDP. Hold furosemide today and reassess volume status and renal function tomorrow before restarting diuretic therapy.    TTE 09/15/17: Study Conclusions  - Left ventricle: The cavity size was at the upper limits of   normal. Wall thickness was increased in a pattern of mild LVH.   Systolic function was vigorous. The estimated ejection fraction   was in the range of 65% to 70%. Wall motion was normal; there   were no regional wall motion abnormalities. Doppler parameters   are consistent with abnormal left ventricular relaxation (grade 1   diastolic dysfunction). Doppler parameters are consistent with   high ventricular filling pressure. - Aortic valve: Transvalvular velocity was increased. There was   moderate stenosis. There was trivial regurgitation. Peak velocity   (S): 308 cm/s. Mean gradient (S): 20 mm Hg. Valve area (VTI):   1.34 cm^2. Valve area (Vmax): 1.11 cm^2. - Ascending aorta: The ascending aorta was mildly dilated. - Mitral valve: Calcified annulus. Mildly thickened leaflets . - Left atrium: The atrium was mildly dilated. - Right ventricle: The cavity size was normal. Systolic function   was normal. - Inferior vena cava: The vessel was dilated. The respirophasic   diameter changes were blunted (< 50%), consistent with elevated   central venous pressure. - Pericardium, extracardiac: A trivial pericardial effusion was   identified posterior to the heart.   Patient  Profile     62 y.o. female with history of CAD as detailed below, HFpEF, mild aortic stenosis, CKD stage IV (baseline ~ 2), anemia of chronic disease, DM, HTN, HLD, hepatitis C, morbid obesity, and OSAwho is being seen today for the evaluation of SOB.  Assessment & Plan    1. Acute respiratory failure with hypoxia: -Likely in the setting of her hypertensive heart disease with acute on chronic diastolic CHF with possible PNA -Improving -Echo as above -D dimer negative  2. Acute on chronic diastolic CHF: -Remains significantly volume overloaded -Now on Lasix gtt  -Echo as above -CHF education -Daily weights and strict Is and Os -Not on spironolactone given CKD  3. CAD: -No chest pain -ASA and Brilinta -Echo as above -No plans for inpatient ischemic evaluation -Lipitor  4. Hypertensive heart disease: -Improved -Exacerbated her CHF -Continue to taper off nitro gtt  -Escalate Imdur to 90 mg daily and hydralazine to 100 mg tid in an effort to taper off nitro gtt -Amlodipine -Not on beta blocker currently given respiratory status  5. Acute on CKD stage IV: -BMP pending this morning -Nephrology on board -Lasix gtt  -Monitor with diuresis   6. Anemia of chronic disease: -Recommend transfusion to goal HGB > 8.5 -Stable -Per IM  7. Diabetes: -Poorly controlled -Per IM  8. Morbid obesity/OSA/possible OHS: -BiPAP as above    For questions or updates, please contact [place] Please consult [email or web address] for contact info under Cardiology/STEMI.    Signed, Christell Faith, PA-C [place] Pager: [phone] 09/16/2017, 7:25 AM

## 2017-09-16 NOTE — Progress Notes (Signed)
[facility] [place] NAME: [name]    MR#:  [id number]  DATE OF BIRTH:  [birth year]  SUBJECTIVE:  CHIEF COMPLAINT:   Chief Complaint  Patient presents with  . Respiratory Distress   Still on oxygen. Urine output 1900 mL over the last 24 hours.  On Lasix, nitro drip.  REVIEW OF SYSTEMS:  Review of Systems  Constitutional: Negative for chills, fever and weight loss.  HENT: Negative for nosebleeds and sore throat.   Eyes: Negative for blurred vision.  Respiratory: Positive for shortness of breath. Negative for cough and wheezing.   Cardiovascular: Negative for chest pain, orthopnea, leg swelling and PND.  Gastrointestinal: Negative for abdominal pain, constipation, diarrhea, heartburn, nausea and vomiting.  Genitourinary: Negative for dysuria and urgency.  Musculoskeletal: Negative for back pain.  Skin: Negative for rash.  Neurological: Negative for dizziness, speech change, focal weakness and headaches.  Endo/Heme/Allergies: Does not bruise/bleed easily.  Psychiatric/Behavioral: Negative for depression.   DRUG ALLERGIES:   Allergies  Allergen Reactions  . Shellfish Allergy Anaphylaxis and Swelling  . Diazepam Other (See Comments)    "felt like out of body experience"  . Morphine And Related Itching   VITALS:  Blood pressure (!) 154/97, pulse 86, temperature 97.9 F (36.6 C), temperature source Axillary, resp. rate 18, height 5\' 8"  (1.727 m), weight (!) 148.1 kg (326 lb 8 oz), SpO2 97 %. PHYSICAL EXAMINATION:  Physical Exam  Constitutional: She is oriented to person, place, and time and well-developed, well-nourished, and in no distress.  HENT:  Head: Normocephalic and atraumatic.  Eyes: Conjunctivae and EOM are normal. Pupils are equal, round, and reactive to light.  Neck: Normal range of motion. Neck supple. No tracheal deviation present. No thyromegaly present.  Cardiovascular: Normal rate, regular rhythm and normal  heart sounds.  Pulmonary/Chest: Effort normal and breath sounds normal. No respiratory distress. She has no wheezes. She exhibits no tenderness.  Abdominal: Soft. Bowel sounds are normal. She exhibits no distension. There is no tenderness.  Musculoskeletal: Normal range of motion. She exhibits edema.  Neurological: She is alert and oriented to person, place, and time. No cranial nerve deficit.  Skin: Skin is warm and dry. No rash noted.  Psychiatric: Mood and affect normal.   LABORATORY PANEL:  Female CBC Recent Labs  Lab 09/16/17 0632  WBC 8.5  HGB 8.0*  HCT 25.0*  PLT 233   ------------------------------------------------------------------------------------------------------------------ Chemistries  Recent Labs  Lab 09/14/17 0706  09/16/17 0632  NA  --    < > 138  K  --    < > 3.9  CL  --    < > 107  CO2  --    < > 18*  GLUCOSE  --    < > 147*  BUN  --    < > 104*  CREATININE  --    < > 3.03*  CALCIUM  --    < > 8.1*  AST 32  --   --   ALT 52  --   --   ALKPHOS 291*  --   --   BILITOT 0.7  --   --    < > = values in this interval not displayed.   RADIOLOGY:  No results found. ASSESSMENT AND PLAN:  62 y.o.femalewith a hx of CAD, mild aortic stenosis, CKD stage IV, anemia of chronic kidney disease, DM, HTN, HLD, hepatitis C, morbid obesity, and OSAadmitted for acute hypoxic resp failure  *  Acute hypoxic respiratory failure with Acute on chronic diastolic chf - Cardiology following -Continue Lasix drip -Echocardiogram with ejection fraction 65-70% - Daily weights and strict Is and Os  * Acute kidney injury over CKD stage III On Lasix drip.  BUN and creatinine continues to worsen but patient needs diuresis due to her fluid overload and respiratory failure. Discussed with Dr. Zollie Scale.  Likely hemodialysis if further worsening  * CAD: -ASA, lipitor and Brilinta -Cardio following  * Malignant Hypertension - BP slowly improving on nitro gtt, titrate as  needed. - Continue Amlodipine, Hydralazine, Imdur. - Not on beta blocker currently given respiratory status. - Titrate of nitro drip  All the records are reviewed and case discussed with Care Management/Social Worker. Management plans discussed with the patient, family and they are in agreement.  CODE STATUS: Full Code  TOTAL TIME TAKING CARE OF THIS PATIENT: 25 minutes.   Neita Carp M.D on 09/16/2017 at 11:12 AM  Between 7am to 6pm - Pager - [phone]  After 6pm go to [email or web address] - password [email or web address] [facility]  [facility]  Office  [phone]  CC: Primary care physician; [name], PA-C  Note: This dictation was prepared with Dragon dictation along with smaller phrase technology. Any transcriptional errors that result from this process are unintentional.

## 2017-09-16 NOTE — Progress Notes (Signed)
[facility]  ROUNDING NOTE   Subjective:  Patient had good urine output of 1.8 L over the preceding 24 hours. Renal function does appear to be a bit worse this a.m. Overall shortness of breath has improved.    Objective:  Vital signs in last 24 hours:  Temp:  [97.6 F (36.4 C)-98.6 F (37 C)] 97.6 F (36.4 C) (03/07 0500) Pulse Rate:  [79-93] 85 (03/07 0600) Resp:  [10-25] 10 (03/07 0600) BP: (126-174)/(53-99) 145/72 (03/07 0600) SpO2:  [94 %-100 %] 98 % (03/07 0600) Weight:  [148.1 kg (326 lb 8 oz)] 148.1 kg (326 lb 8 oz) (03/07 0500)  Weight change: 12 kg (26 lb 8 oz) Filed Weights   09/14/17 1600 09/15/17 0332 09/16/17 0500  Weight: (!) 145.3 kg (320 lb 5.3 oz) (!) 148 kg (326 lb 4.5 oz) (!) 148.1 kg (326 lb 8 oz)    Intake/Output: I/O last 3 completed shifts: In: 74 [P.O.:710; I.V.:192] Out: 3075 [Urine:3075]   Intake/Output this shift:  No intake/output data recorded.  Physical Exam: General: No acute distress  Head: Normocephalic, atraumatic. Moist oral mucosal membranes  Eyes: Anicteric  Neck: Supple, trachea midline  Lungs:  Basilar rales, normal effort  Heart: S1S2 no rubs  Abdomen:  Soft, nontender, bowel sounds present  Extremities: 2+ peripheral edema.  Neurologic: Awake, alert, following commands  Skin: No lesions       Basic Metabolic Panel: Recent Labs  Lab 09/14/17 0710 09/14/17 2312 09/15/17 0618 09/16/17 0632  NA 139 135 134* 138  K 4.4 4.3 4.3 3.9  CL 109 106 106 107  CO2 17* 16* 18* 18*  GLUCOSE 245* 387* 393* 147*  BUN 80* 90* 93* 104*  CREATININE 2.89* 2.86* 2.83* 3.03*  CALCIUM 8.0* 8.0* 8.0* 8.1*    Liver Function Tests: Recent Labs  Lab 09/14/17 0706  AST 32  ALT 52  ALKPHOS 291*  BILITOT 0.7  PROT 7.6  ALBUMIN 3.7   No results for input(s): LIPASE, AMYLASE in the last 168 hours. No results for input(s): AMMONIA in the last 168 hours.  CBC: Recent Labs  Lab 09/14/17 0710 09/14/17 2312  09/15/17 0618 09/16/17 0632  WBC 10.7 7.4 7.6 8.5  NEUTROABS 8.3* 6.8* 6.4 5.1  HGB 9.1* 8.3* 7.9* 8.0*  HCT 27.8* 25.6* 24.7* 25.0*  MCV 85.4 85.6 85.2 85.7  PLT 272 207 208 233    Cardiac Enzymes: Recent Labs  Lab 09/14/17 0710 09/14/17 1104 09/14/17 1635 09/14/17 2312  TROPONINI <0.03 <0.03 <0.03 <0.03    BNP: Invalid input(s): POCBNP  CBG: Recent Labs  Lab 09/16/17 0211 09/16/17 0308 09/16/17 0509 09/16/17 0624 09/16/17 0730  GLUCAP 138* 26* 141* 136* 136*    Microbiology: Results for orders placed or performed during the hospital encounter of 09/14/17  MRSA PCR Screening     Status: None   Collection Time: 09/14/17  4:40 PM  Result Value Ref Range Status   MRSA by PCR NEGATIVE NEGATIVE Final    Comment:        The GeneXpert MRSA Assay (FDA approved for NASAL specimens only), is one component of a comprehensive MRSA colonization surveillance program. It is not intended to diagnose MRSA infection nor to guide or monitor treatment for MRSA infections. Performed at [facility], [address] [place],  [address]   CULTURE, BLOOD (ROUTINE X 2) w Reflex to ID Panel     Status: None (Preliminary result)   Collection Time: 09/14/17 11:11 PM  Result Value  Ref Range Status   Specimen Description BLOOD LT [facility]  Final   Special Requests   Final    BOTTLES DRAWN AEROBIC AND ANAEROBIC Blood Culture adequate volume   Culture   Final    NO GROWTH 2 DAYS Performed at [facility], [address] [place], [place] [address]    Report Status PENDING  Incomplete  CULTURE, BLOOD (ROUTINE X 2) w Reflex to ID Panel     Status: None (Preliminary result)   Collection Time: 09/14/17 11:37 PM  Result Value Ref Range Status   Specimen Description BLOOD RT ARM  Final   Special Requests   Final    BOTTLES DRAWN AEROBIC AND ANAEROBIC Blood Culture results may not be optimal due to an inadequate volume of blood received in culture bottles    Culture   Final    NO GROWTH 2 DAYS Performed at [facility], [address] [place], [place] [address]    Report Status PENDING  Incomplete    Coagulation Studies: Recent Labs    09/14/17 0706  LABPROT 12.9  INR 0.98    Urinalysis: Recent Labs    09/15/17 0318  COLORURINE YELLOW*  LABSPEC 1.011  PHURINE 5.0  GLUCOSEU NEGATIVE  HGBUR NEGATIVE  BILIRUBINUR NEGATIVE  KETONESUR NEGATIVE  PROTEINUR 30*  NITRITE NEGATIVE  LEUKOCYTESUR NEGATIVE      Imaging: US Renal  Result Date: 09/14/2017 CLINICAL DATA:  Shortness of breath for 2 days.  Oliguria. EXAM: RENAL / URINARY TRACT ULTRASOUND COMPLETE COMPARISON:  Renal ultrasound 03/19/2017. FINDINGS: Right Kidney: Length: 11.8 cm. Cortical echogenicity is increased. No hydronephrosis. Two small cysts are identified measuring approximately 1 cm in diameter. Left Kidney: Length: 11.2 cm. Cortical echogenicity is increased. No hydronephrosis. Single simple cyst measures 2.4 cm in diameter. Bladder: Appears normal for degree of bladder distention. IMPRESSION: Negative for hydronephrosis.  No acute abnormality. Increased cortical echogenicity bilaterally consistent with medical renal disease. Electronically Signed   By: Inge Rise M.D.   On: 09/14/2017 09:39   Dg Chest Port 1 View  Result Date: 09/15/2017 CLINICAL DATA:  Respiratory failure EXAM: PORTABLE CHEST 1 VIEW COMPARISON:  09/14/2017, 07/15/2017 FINDINGS: Cardiomegaly. Subtle bilateral ground-glass opacities. No pleural effusion. No pneumothorax. IMPRESSION: 1. Overall improved aeration since prior radiograph with mild residual ground-glass opacity bilaterally, likely resolving edema. 2. Cardiomegaly Electronically Signed   By: Donavan Foil M.D.   On: 09/15/2017 03:53     Medications:   . sodium chloride    . furosemide (LASIX) infusion 5 mg/hr (09/16/17 0630)  . insulin (NOVOLIN-R) infusion 2.4 Units/hr (09/16/17 0733)   . allopurinol  100 mg Oral Daily   . amLODipine  10 mg Oral Daily  . aspirin EC  81 mg Oral Daily  . atorvastatin  80 mg Oral q1800  . budesonide (PULMICORT) nebulizer solution  0.5 mg Nebulization BID  . colchicine  0.6 mg Oral Daily  . docusate sodium  100 mg Oral Q12H  . famotidine  20 mg Oral Daily  . ferrous sulfate  325 mg Oral BID WC  . heparin  5,000 Units Subcutaneous Q8H  . hydrALAZINE  100 mg Oral TID  . ipratropium-albuterol  3 mL Nebulization Q6H  . isosorbide mononitrate  90 mg Oral Daily  . pantoprazole  40 mg Oral Daily  . pregabalin  100 mg Oral TID  . ticagrelor  90 mg Oral BID  . valACYclovir  500 mg Oral Daily  . [START ON 10/11/2017] Vitamin D (Ergocalciferol)  50,000 Units Oral Q30 days   sodium chloride, albuterol, fluticasone, hydrALAZINE, hydrOXYzine, hydrOXYzine, ketotifen, nitroGLYCERIN, oxyCODONE-acetaminophen  Assessment/ Plan:  62 y.o. female  African-American female with chronic kidney disease stage III followed by Dr. Arty Baumgartner, [facility], hypertension, coronary artery disease, congestive heart failure with diastolic dysfunction, diabetes mellitus type II, hepatitis C chronic, peptic ulcer disease, obstructive sleep apnea  1.  Acute renal failure on chronic kidney disease stage III baseline creatinine 1.95 2.  Peripheral edema. 3.  Hypertension, severe upon admission. 4.  Acute respiratory failure, now off of BiPAP.  Plan: Renal function unfortunately is a bit worse.  However she is requiring Lasix for diuresis.  Good urine output of 1.8 L was noted over the preceding 24 hours.  For now we will maintain the patient on furosemide drip.  If renal function continues to deteriorate we may need to consider renal replacement therapy in the relative near future.  However since she is responding to diuretic therapy we will hold off on this for now.  Respiratory failure has improved as she is currently off of BiPAP.  Continue to monitor renal parameters daily for now.   LOS:  2 Kamal Jurgens 3/7/20197:58 AM

## 2017-09-17 ENCOUNTER — Other Ambulatory Visit: Payer: Self-pay | Admitting: Physician Assistant

## 2017-09-17 ENCOUNTER — Telehealth: Payer: Self-pay | Admitting: Physician Assistant

## 2017-09-17 DIAGNOSIS — IMO0002 Reserved for concepts with insufficient information to code with codable children: Secondary | ICD-10-CM

## 2017-09-17 DIAGNOSIS — E118 Type 2 diabetes mellitus with unspecified complications: Principal | ICD-10-CM

## 2017-09-17 DIAGNOSIS — E1165 Type 2 diabetes mellitus with hyperglycemia: Secondary | ICD-10-CM

## 2017-09-17 LAB — GLUCOSE, CAPILLARY
Glucose-Capillary: 116 mg/dL — ABNORMAL HIGH (ref 65–99)
Glucose-Capillary: 140 mg/dL — ABNORMAL HIGH (ref 65–99)
Glucose-Capillary: 159 mg/dL — ABNORMAL HIGH (ref 65–99)
Glucose-Capillary: 135 mg/dL — ABNORMAL HIGH (ref 65–99)

## 2017-09-17 MED ORDER — HYDRALAZINE HCL 50 MG PO TABS
75.0000 mg | ORAL_TABLET | Freq: Three times a day (TID) | ORAL | Status: DC
  Administered 2017-09-17 – 2017-09-20 (×9): 75 mg via ORAL
  Filled 2017-09-17 (×9): qty 2

## 2017-09-17 MED ORDER — CARVEDILOL 6.25 MG PO TABS
6.2500 mg | ORAL_TABLET | Freq: Two times a day (BID) | ORAL | Status: DC
  Administered 2017-09-17 – 2017-09-18 (×2): 6.25 mg via ORAL
  Filled 2017-09-17 (×2): qty 1

## 2017-09-17 NOTE — Progress Notes (Signed)
[facility]  ROUNDING NOTE   Subjective:  Patient had good urine output of 3150 cc Still feels short of breath Continued on lasix drip  Objective:  Vital signs in last 24 hours:  Temp:  [98 F (36.7 C)-99.1 F (37.3 C)] 98.2 F (36.8 C) (03/08 0736) Pulse Rate:  [82-102] 88 (03/08 0736) Resp:  [12-18] 15 (03/07 1600) BP: (126-179)/(59-99) 145/83 (03/08 0736) SpO2:  [93 %-100 %] 96 % (03/08 0949) Weight:  [143.8 kg (317 lb)-143.8 kg (317 lb 1.6 oz)] 143.8 kg (317 lb 1.6 oz) (03/08 0355)  Weight change: -4.31 kg (-8 oz) Filed Weights   09/16/17 0500 09/16/17 1730 09/17/17 0355  Weight: (!) 148.1 kg (326 lb 8 oz) (!) 143.8 kg (317 lb) (!) 143.8 kg (317 lb 1.6 oz)    Intake/Output: I/O last 3 completed shifts: In: 1168 [P.O.:840; I.V.:328] Out: 2409 [Urine:3925]   Intake/Output this shift:  No intake/output data recorded.  Physical Exam: General: No acute distress  Head: Normocephalic, atraumatic. Moist oral mucosal membranes  Eyes: Anicteric  Neck: Supple, trachea midline  Lungs:  Mild Basilar rales, normal effort  Heart: S1S2 no rubs  Abdomen:  Soft, nontender, bowel sounds present  Extremities: 2+ peripheral edema.  Neurologic: Awake, alert, following commands  Skin: No lesions       Basic Metabolic Panel: Recent Labs  Lab 09/14/17 0710 09/14/17 2312 09/15/17 0618 09/16/17 0632  NA 139 135 134* 138  K 4.4 4.3 4.3 3.9  CL 109 106 106 107  CO2 17* 16* 18* 18*  GLUCOSE 245* 387* 393* 147*  BUN 80* 90* 93* 104*  CREATININE 2.89* 2.86* 2.83* 3.03*  CALCIUM 8.0* 8.0* 8.0* 8.1*    Liver Function Tests: Recent Labs  Lab 09/14/17 0706  AST 32  ALT 52  ALKPHOS 291*  BILITOT 0.7  PROT 7.6  ALBUMIN 3.7   No results for input(s): LIPASE, AMYLASE in the last 168 hours. No results for input(s): AMMONIA in the last 168 hours.  CBC: Recent Labs  Lab 09/14/17 0710 09/14/17 2312 09/15/17 0618 09/16/17 0632  WBC 10.7 7.4 7.6 8.5   NEUTROABS 8.3* 6.8* 6.4 5.1  HGB 9.1* 8.3* 7.9* 8.0*  HCT 27.8* 25.6* 24.7* 25.0*  MCV 85.4 85.6 85.2 85.7  PLT 272 207 208 233    Cardiac Enzymes: Recent Labs  Lab 09/14/17 0710 09/14/17 1104 09/14/17 1635 09/14/17 2312  TROPONINI <0.03 <0.03 <0.03 <0.03    BNP: Invalid input(s): POCBNP  CBG: Recent Labs  Lab 09/16/17 1127 09/16/17 1223 09/16/17 1400 09/16/17 1737 09/16/17 2029  GLUCAP 156* 121* 134* 141* 126*    Microbiology: Results for orders placed or performed during the hospital encounter of 09/14/17  MRSA PCR Screening     Status: None   Collection Time: 09/14/17  4:40 PM  Result Value Ref Range Status   MRSA by PCR NEGATIVE NEGATIVE Final    Comment:        The GeneXpert MRSA Assay (FDA approved for NASAL specimens only), is one component of a comprehensive MRSA colonization surveillance program. It is not intended to diagnose MRSA infection nor to guide or monitor treatment for MRSA infections. Performed at [facility], [address] [place], [place] [address]   CULTURE, BLOOD (ROUTINE X 2) w Reflex to ID Panel     Status: None (Preliminary result)   Collection Time: 09/14/17 11:11 PM  Result Value Ref Range Status   Specimen Description BLOOD LT [facility]  Final   Special  Requests   Final    BOTTLES DRAWN AEROBIC AND ANAEROBIC Blood Culture adequate volume   Culture   Final    NO GROWTH 3 DAYS Performed at [facility], [address] [place], [place] [address]    Report Status PENDING  Incomplete  CULTURE, BLOOD (ROUTINE X 2) w Reflex to ID Panel     Status: None (Preliminary result)   Collection Time: 09/14/17 11:37 PM  Result Value Ref Range Status   Specimen Description BLOOD RT ARM  Final   Special Requests   Final    BOTTLES DRAWN AEROBIC AND ANAEROBIC Blood Culture results may not be optimal due to an inadequate volume of blood received in culture bottles   Culture   Final    NO GROWTH 3 DAYS Performed at  [facility], [address], [place], [place] [address]    Report Status PENDING  Incomplete  Urine Culture     Status: None   Collection Time: 09/15/17  3:18 AM  Result Value Ref Range Status   Specimen Description   Final    URINE, RANDOM Performed at [facility], [address] [place], [place] [address]    Special Requests   Final    NONE Performed at [facility], [address], [place], [place] [address]    Culture   Final    NO GROWTH Performed at [facility], [place] [address], [place], [place] [address]    Report Status 09/16/2017 FINAL  Final    Coagulation Studies: No results for input(s): LABPROT, INR in the last 72 hours.  Urinalysis: Recent Labs    09/15/17 0318  COLORURINE YELLOW*  LABSPEC 1.011  PHURINE 5.0  GLUCOSEU NEGATIVE  HGBUR NEGATIVE  BILIRUBINUR NEGATIVE  KETONESUR NEGATIVE  PROTEINUR 30*  NITRITE NEGATIVE  LEUKOCYTESUR NEGATIVE      Imaging: No results found.   Medications:   . sodium chloride    . furosemide (LASIX) infusion 5 mg/hr (09/16/17 0630)   . allopurinol  100 mg Oral Daily  . amLODipine  10 mg Oral Daily  . aspirin EC  81 mg Oral Daily  . atorvastatin  80 mg Oral q1800  . budesonide (PULMICORT) nebulizer solution  0.5 mg Nebulization BID  . carvedilol  6.25 mg Oral BID WC  . colchicine  0.6 mg Oral Daily  . docusate sodium  100 mg Oral Q12H  . ferrous sulfate  325 mg Oral BID WC  . heparin  5,000 Units Subcutaneous Q8H  . hydrALAZINE  75 mg Oral TID  . insulin aspart  0-15 Units Subcutaneous TID WC  . insulin aspart  0-5 Units Subcutaneous QHS  . insulin aspart  10 Units Subcutaneous TID WC  . insulin glargine  45 Units Subcutaneous BID  . ipratropium-albuterol  3 mL Nebulization Q6H  . isosorbide mononitrate  90 mg Oral Daily  . pantoprazole  40 mg Oral Daily  . pregabalin  100 mg Oral TID  . ticagrelor  90 mg Oral BID  . valACYclovir  500 mg Oral Daily  . [START ON  10/11/2017] Vitamin D (Ergocalciferol)  50,000 Units Oral Q30 days   sodium chloride, albuterol, fluticasone, hydrALAZINE, hydrOXYzine, hydrOXYzine, ketotifen, nitroGLYCERIN, oxyCODONE-acetaminophen  Assessment/ Plan:  62 y.o. [name]  African-American [name] with chronic kidney disease stage III followed by Dr. Arty Baumgartner, [facility], hypertension, coronary artery disease, congestive heart failure with diastolic dysfunction, diabetes mellitus type II, hepatitis C chronic, peptic ulcer disease, obstructive  sleep apnea  1.  Acute renal failure on chronic kidney disease stage III baseline creatinine 1.95 2.  Peripheral edema. 3.  Hypertension, severe upon admission. 4.  Acute respiratory failure, now off of BiPAP.  Plan: Renal function unfortunately is a bit worse, likely secondary to malignant HTN.   Continue lasix infusion for another day Low salt diet PT/OT No acute indication for HD at present   LOS: 3 Heather [name] 3/8/201910:04 AM

## 2017-09-17 NOTE — Progress Notes (Signed)
BCise on national backorder. Patient currently in hospital. Will await discharge to see what she is sent home on prior to changing treatment at this time.

## 2017-09-17 NOTE — Telephone Encounter (Signed)
Please Review.  Thanks,  -Dulcinea Kinser 

## 2017-09-17 NOTE — Telephone Encounter (Signed)
[facility] Nurse Care Manager with [facility] wanted to let [name] know that she contacted pt for a follow up call  And pt advised her that she was in the hospital for pneumonia. Also, she had previously spoke with pt about diabetic education and pt was open to it. [name] is requesting a referral for diabetic education at [facility]. Please advise. Thanks TNP

## 2017-09-17 NOTE — Progress Notes (Signed)
[place] at [facility]                                                                                                                                                                                  Patient Demographics   [name], is a 62 y.o. female, DOB - 11/28/1955, OZH:[id number]  Admit date - 09/14/2017   Admitting Physician Max Sane, MD  Outpatient Primary MD for the patient is [name], PA-C   LOS - 3  Subjective: Patient still has shortness of breath Still on oxygen via nasal canula and on IV Lasix drip Urine output last 24 hours 720 mL    Review of Systems:   CONSTITUTIONAL: No documented fever. No fatigue, weakness. No weight gain, no weight loss.  EYES: No blurry or double vision.  ENT: No tinnitus. No postnasal drip. No redness of the oropharynx.  RESPIRATORY: No cough, no wheeze, no hemoptysis.  Has shortness of breath CARDIOVASCULAR: No chest pain. Has orthopnea. No palpitations. No syncope.  Has leg edema. GASTROINTESTINAL: No nausea, no vomiting or diarrhea. No abdominal pain. No melena or hematochezia.  GENITOURINARY: No dysuria or hematuria.  ENDOCRINE: No polyuria or nocturia. No heat or cold intolerance.  HEMATOLOGY: No anemia. No bruising. No bleeding.  INTEGUMENTARY: No rashes. No lesions.  MUSCULOSKELETAL: No arthritis. Leg  Swelling present. No gout.  NEUROLOGIC: No numbness, tingling, or ataxia. No seizure-type activity.  PSYCHIATRIC: No anxiety. No insomnia. No ADD.    Vitals:   Vitals:   09/17/17 0355 09/17/17 0736 09/17/17 0748 09/17/17 0949  BP: 139/68 (!) 145/83    Pulse: 93 88    Resp:      Temp: 99.1 F (37.3 C) 98.2 F (36.8 C)    TempSrc: Oral Oral    SpO2: 100%  96% 96%  Weight: (!) 143.8 kg (317 lb 1.6 oz)     Height:        Wt Readings from Last 3 Encounters:  09/17/17 (!) 143.8 kg (317 lb 1.6 oz)  09/01/17 136.1 kg (300 lb)  08/19/17 (!) 148.3 kg (326 lb 14.4 oz)      Intake/Output Summary (Last 24 hours) at 09/17/2017 1125 Last data filed at 09/17/2017 1008 Gross per 24 hour  Intake 1287.96 ml  Output 2750 ml  Net -1462.04 ml    Physical Exam:   GENERAL: Pleasant-appearing female patient lying on the bed on oxygen via nasal canula.[name]  HEAD, EYES, EARS, NOSE AND THROAT: Atraumatic, normocephalic. Extraocular muscles are intact. Pupils equal and reactive to light. Sclerae anicteric. No conjunctival injection. No oro-pharyngeal erythema.  NECK: Supple. There is no jugular venous distention. No bruits, no  lymphadenopathy, no thyromegaly.  HEART: Regular rate and rhythm,. No murmurs, no rubs, no clicks.  LUNGS: Clear to auscultation bilaterally. No rales or rhonchi. No wheezes.  ABDOMEN: Soft, flat, nontender, nondistended. Has good bowel sounds. No hepatosplenomegaly appreciated.  EXTREMITIES: No evidence of any cyanosis, clubbing.  +2 pedal and radial pulses bilaterally.  Has pedal edema bilaterally NEUROLOGIC: The patient is alert, awake, and oriented x3 with no focal motor or sensory deficits appreciated bilaterally.  SKIN: Moist and warm with no rashes appreciated.  Psych: Not anxious, depressed LN: No inguinal LN enlargement    Antibiotics   Anti-infectives (From admission, onward)   Start     Dose/Rate Route Frequency Ordered Stop   09/14/17 1315  valACYclovir (VALTREX) tablet 500 mg     500 mg Oral Daily 09/14/17 1310        Medications   Scheduled Meds: . allopurinol  100 mg Oral Daily  . amLODipine  10 mg Oral Daily  . aspirin EC  81 mg Oral Daily  . atorvastatin  80 mg Oral q1800  . budesonide (PULMICORT) nebulizer solution  0.5 mg Nebulization BID  . carvedilol  6.25 mg Oral BID WC  . colchicine  0.6 mg Oral Daily  . docusate sodium  100 mg Oral Q12H  . ferrous sulfate  325 mg Oral BID WC  . heparin  5,000 Units Subcutaneous Q8H  . hydrALAZINE  75 mg Oral TID  . insulin aspart  0-15 Units Subcutaneous TID WC  . insulin  aspart  0-5 Units Subcutaneous QHS  . insulin aspart  10 Units Subcutaneous TID WC  . insulin glargine  45 Units Subcutaneous BID  . ipratropium-albuterol  3 mL Nebulization Q6H  . isosorbide mononitrate  90 mg Oral Daily  . pantoprazole  40 mg Oral Daily  . pregabalin  100 mg Oral TID  . ticagrelor  90 mg Oral BID  . valACYclovir  500 mg Oral Daily  . [START ON 10/11/2017] Vitamin D (Ergocalciferol)  50,000 Units Oral Q30 days   Continuous Infusions: . sodium chloride    . furosemide (LASIX) infusion 5 mg/hr (09/16/17 0630)   PRN Meds:.sodium chloride, albuterol, fluticasone, hydrALAZINE, hydrOXYzine, hydrOXYzine, ketotifen, nitroGLYCERIN, oxyCODONE-acetaminophen   Data Review:   Micro Results Recent Results (from the past 240 hour(s))  MRSA PCR Screening     Status: None   Collection Time: 09/14/17  4:40 PM  Result Value Ref Range Status   MRSA by PCR NEGATIVE NEGATIVE Final    Comment:        The GeneXpert MRSA Assay (FDA approved for NASAL specimens only), is one component of a comprehensive MRSA colonization surveillance program. It is not intended to diagnose MRSA infection nor to guide or monitor treatment for MRSA infections. Performed at [facility], [address] [place], [place] [address]   CULTURE, BLOOD (ROUTINE X 2) w Reflex to ID Panel     Status: None (Preliminary result)   Collection Time: 09/14/17 11:11 PM  Result Value Ref Range Status   Specimen Description BLOOD LT [facility]  Final   Special Requests   Final    BOTTLES DRAWN AEROBIC AND ANAEROBIC Blood Culture adequate volume   Culture   Final    NO GROWTH 3 DAYS Performed at [facility], [address], [place], [place] [address]    Report Status PENDING  Incomplete  CULTURE, BLOOD (ROUTINE X 2) w Reflex to ID Panel     Status: None (Preliminary result)  Collection Time: 09/14/17 11:37 PM  Result Value Ref Range Status   Specimen Description BLOOD RT ARM  Final   Special  Requests   Final    BOTTLES DRAWN AEROBIC AND ANAEROBIC Blood Culture results may not be optimal due to an inadequate volume of blood received in culture bottles   Culture   Final    NO GROWTH 3 DAYS Performed at [facility], [address] [place], [place] [address]    Report Status PENDING  Incomplete  Urine Culture     Status: None   Collection Time: 09/15/17  3:18 AM  Result Value Ref Range Status   Specimen Description   Final    URINE, RANDOM Performed at [facility], [address] [place], [place] [address]    Special Requests   Final    NONE Performed at [facility], [address] [place], [place] [address]    Culture   Final    NO GROWTH Performed at [facility], [place] [address], [place], [place] [address]    Report Status 09/16/2017 FINAL  Final    Radiology Reports US Renal  Result Date: 09/14/2017 CLINICAL DATA:  Shortness of breath for 2 days.  Oliguria. EXAM: RENAL / URINARY TRACT ULTRASOUND COMPLETE COMPARISON:  Renal ultrasound 03/19/2017. FINDINGS: Right Kidney: Length: 11.8 cm. Cortical echogenicity is increased. No hydronephrosis. Two small cysts are identified measuring approximately 1 cm in diameter. Left Kidney: Length: 11.2 cm. Cortical echogenicity is increased. No hydronephrosis. Single simple cyst measures 2.4 cm in diameter. Bladder: Appears normal for degree of bladder distention. IMPRESSION: Negative for hydronephrosis.  No acute abnormality. Increased cortical echogenicity bilaterally consistent with medical renal disease. Electronically Signed   By: Inge Rise M.D.   On: 09/14/2017 09:39   Dg Chest Port 1 View  Result Date: 09/15/2017 CLINICAL DATA:  Respiratory failure EXAM: PORTABLE CHEST 1 VIEW COMPARISON:  09/14/2017, 07/15/2017 FINDINGS: Cardiomegaly. Subtle bilateral ground-glass opacities. No pleural effusion. No pneumothorax. IMPRESSION: 1. Overall improved aeration since prior radiograph with mild  residual ground-glass opacity bilaterally, likely resolving edema. 2. Cardiomegaly Electronically Signed   By: Donavan Foil M.D.   On: 09/15/2017 03:53   Dg Chest Port 1 View  Result Date: 09/14/2017 CLINICAL DATA:  Shortness of Breath EXAM: PORTABLE CHEST 1 VIEW COMPARISON:  09/04/2017 FINDINGS: Cardiomegaly. Diffuse bilateral airspace opacities are new since prior study. No visible effusions. No acute bony abnormality. IMPRESSION: Diffuse bilateral airspace opacities could reflect edema/CHF or infection. Electronically Signed   By: Rolm Baptise M.D.   On: 09/14/2017 07:36     CBC Recent Labs  Lab 09/14/17 0710 09/14/17 2312 09/15/17 0618 09/16/17 0632  WBC 10.7 7.4 7.6 8.5  HGB 9.1* 8.3* 7.9* 8.0*  HCT 27.8* 25.6* 24.7* 25.0*  PLT 272 207 208 233  MCV 85.4 85.6 85.2 85.7  MCH 28.0 27.7 27.2 27.4  MCHC 32.8 32.3 32.0 32.0  RDW 18.9* 19.1* 18.9* 18.7*  LYMPHSABS 1.1 0.4* 0.6* 1.9  MONOABS 1.0* 0.2 0.6 1.3*  EOSABS 0.2 0.0 0.0 0.1  BASOSABS 0.1 0.0 0.0 0.0    Chemistries  Recent Labs  Lab 09/14/17 0706 09/14/17 0710 09/14/17 2312 09/15/17 0618 09/16/17 0632  NA  --  139 135 134* 138  K  --  4.4 4.3 4.3 3.9  CL  --  109 106 106 107  CO2  --  17* 16* 18* 18*  GLUCOSE  --  245* 387* 393* 147*  BUN  --  80* 90* 93* 104*  CREATININE  --  2.89* 2.86* 2.83* 3.03*  CALCIUM  --  8.0* 8.0* 8.0* 8.1*  AST 32  --   --   --   --   ALT 52  --   --   --   --   ALKPHOS 291*  --   --   --   --   BILITOT 0.7  --   --   --   --    ------------------------------------------------------------------------------------------------------------------ estimated creatinine clearance is 30.3 mL/min (A) (by C-G formula based on SCr of 3.03 mg/dL (H)). ------------------------------------------------------------------------------------------------------------------ No results for input(s): HGBA1C in the last 72  hours. ------------------------------------------------------------------------------------------------------------------ No results for input(s): CHOL, HDL, LDLCALC, TRIG, CHOLHDL, LDLDIRECT in the last 72 hours. ------------------------------------------------------------------------------------------------------------------ No results for input(s): TSH, T4TOTAL, T3FREE, THYROIDAB in the last 72 hours.  Invalid input(s): FREET3 ------------------------------------------------------------------------------------------------------------------ No results for input(s): VITAMINB12, FOLATE, FERRITIN, TIBC, IRON, RETICCTPCT in the last 72 hours.  Coagulation profile Recent Labs  Lab 09/14/17 0706  INR 0.98    No results for input(s): DDIMER in the last 72 hours.  Cardiac Enzymes Recent Labs  Lab 09/14/17 1104 09/14/17 1635 09/14/17 2312  TROPONINI <0.03 <0.03 <0.03   ------------------------------------------------------------------------------------------------------------------ Invalid input(s): POCBNP    Assessment & Plan   62 y.o.femalewith a hx of CAD,mild aortic stenosis, CKD stage IV,anemia of chronic kidneydisease, DM, HTN, HLD, hepatitis C, morbid obesity, and OSAadmitted for acute hypoxic resp failure and heart failure exacerbation.  *Acutehypoxicrespiratory failure with Acute on chronic diastolic Congestive heart failure -Cardiology following -Continue Lasix drip for 1 more day -Echocardiogram with ejection fraction 65-70% - Daily weights and strict Is and Os  *Acute kidney injury over CKD stage III On Lasix drip.  Discussed with Dr. Candiss Norse.   No dialysis for now  *CAD: -ASA, lipitorand Brilinta -Cardio following  * MalignantHypertension - BP improved -off Nitroglycerin drip -ContinueAmlodipine, Hydralazine, Imdur. - Not on beta blocker currently given respiratory status.   All the records are reviewed and case discussed with Care  Management/Social Worker. Management plans discussed with the patient, family and they are in agreement.  CODE STATUS: Full Code       Code Status Orders  (From admission, onward)        Start     Ordered   09/14/17 1311  Full code  Continuous     09/14/17 1310    Code Status History    Date Active Date Inactive Code Status Order ID Comments User Context   08/15/2017 23:06 08/19/2017 20:39 Full Code [id number]  Robert Bellow, MD Inpatient   08/11/2017 13:50 08/15/2017 23:06 Full Code [id number]  Demetrios Loll, MD Inpatient   06/02/2017 02:34 06/06/2017 15:36 Full Code [id number]  Caren Griffins, MD Inpatient   04/28/2017 05:16 05/02/2017 16:24 Full Code [id number]  Rise Patience, MD ED   03/17/2017 12:22 03/19/2017 20:30 Full Code [id number]  Liliane Shi, PA-C ED   12/13/2016 03:45 12/14/2016 16:47 Full Code [id number]  Reubin Milan, MD Inpatient   09/13/2016 11:49 09/19/2016 14:58 Full Code [id number]  Flossie Dibble, MD ED   09/04/2016 01:37 09/05/2016 18:01 Full Code [id number]  Etta Quill, DO ED   08/21/2016 18:46 08/22/2016 17:33 Full Code [id number]  Thurnell Lose, MD Inpatient   03/18/2015 09:39 03/19/2015 18:02 Full Code [id number]  Dellia Nims, MD Inpatient   02/15/2015 17:00 02/18/2015 18:17 Full Code [id number]  Francesca Oman, DO Inpatient   10/02/2014 15:24 10/04/2014 16:49 Full Code  [id number]  Samella Parr, NP Inpatient   03/25/2012 05:31 04/03/2012 19:31 Full Code [id number]  Maudry Diego, RN Inpatient      Lab Results  Component Value Date   PLT 233 09/16/2017     Time Spent in minutes   25 minutes  Reatha Harps Seana Underwood M.D on 09/17/2017 at 11:25 AM  Between 7am to 6pm - Pager - [phone]  After 6pm go to [email or web address] - Proofreader  [employer]   Office  [phone]

## 2017-09-17 NOTE — Progress Notes (Signed)
Progress Note  Patient Name: [name] Date of Encounter: 09/17/2017  Primary Cardiologist: Burt Knack  Subjective   Still with shortness of breath but she reports improvement.  No chest pain.  Good urine output on furosemide drip with slight worsening of renal function.  She is -4 L and her weight is down 3 pounds.  Inpatient Medications    Scheduled Meds: . allopurinol  100 mg Oral Daily  . amLODipine  10 mg Oral Daily  . aspirin EC  81 mg Oral Daily  . atorvastatin  80 mg Oral q1800  . budesonide (PULMICORT) nebulizer solution  0.5 mg Nebulization BID  . carvedilol  6.25 mg Oral BID WC  . colchicine  0.6 mg Oral Daily  . docusate sodium  100 mg Oral Q12H  . ferrous sulfate  325 mg Oral BID WC  . heparin  5,000 Units Subcutaneous Q8H  . hydrALAZINE  75 mg Oral TID  . insulin aspart  0-15 Units Subcutaneous TID WC  . insulin aspart  0-5 Units Subcutaneous QHS  . insulin aspart  10 Units Subcutaneous TID WC  . insulin glargine  45 Units Subcutaneous BID  . ipratropium-albuterol  3 mL Nebulization Q6H  . isosorbide mononitrate  90 mg Oral Daily  . pantoprazole  40 mg Oral Daily  . pregabalin  100 mg Oral TID  . ticagrelor  90 mg Oral BID  . valACYclovir  500 mg Oral Daily  . [START ON 10/11/2017] Vitamin D (Ergocalciferol)  50,000 Units Oral Q30 days   Continuous Infusions: . sodium chloride    . furosemide (LASIX) infusion 5 mg/hr (09/16/17 0630)   PRN Meds: sodium chloride, albuterol, fluticasone, hydrALAZINE, hydrOXYzine, hydrOXYzine, ketotifen, nitroGLYCERIN, oxyCODONE-acetaminophen   Vital Signs    Vitals:   09/16/17 2122 09/17/17 0355 09/17/17 0736 09/17/17 0949  BP:  139/68 (!) 145/83   Pulse:  93 88   Resp:      Temp:  99.1 F (37.3 C) 98.2 F (36.8 C)   TempSrc:  Oral Oral   SpO2: 96% 100%  96%  Weight:  (!) 317 lb 1.6 oz (143.8 kg)    Height:        Intake/Output Summary (Last 24 hours) at 09/17/2017 1002 Last data filed at 09/17/2017 0358 Gross per  24 hour  Intake 807.96 ml  Output 2750 ml  Net -1942.04 ml   Filed Weights   09/16/17 0500 09/16/17 1730 09/17/17 0355  Weight: (!) 326 lb 8 oz (148.1 kg) (!) 317 lb (143.8 kg) (!) 317 lb 1.6 oz (143.8 kg)    Telemetry    Normal sinus rhythm with sinus tachycardia- Personally Reviewed  ECG    Not done today.- Personally Reviewed  Physical Exam   GEN: No acute distress.  Morbidly obese Neck:  Jugular venous pressure is not visualized. Cardiac: RRR, no rubs, or gallops.  2 out of 6 crescendo decrescendo systolic murmur in the aortic area Respiratory: Clear to auscultation bilaterally with diminished breath sounds. GI: Soft, nontender, non-distended  MS:  No deformity.  Moderate bilateral pitting edema Neuro:  Nonfocal  Psych: Normal affect   Labs    Chemistry Recent Labs  Lab 09/14/17 0706  09/14/17 2312 09/15/17 0618 09/16/17 3662  NA  --    < > 135 134* 138  K  --    < > 4.3 4.3 3.9  CL  --    < > 106 106 107  CO2  --    < >  16* 18* 18*  GLUCOSE  --    < > 387* 393* 147*  BUN  --    < > 90* 93* 104*  CREATININE  --    < > 2.86* 2.83* 3.03*  CALCIUM  --    < > 8.0* 8.0* 8.1*  PROT 7.6  --   --   --   --   ALBUMIN 3.7  --   --   --   --   AST 32  --   --   --   --   ALT 52  --   --   --   --   ALKPHOS 291*  --   --   --   --   BILITOT 0.7  --   --   --   --   GFRNONAA  --    < > 17* 17* 16*  GFRAA  --    < > 20* 20* 18*  ANIONGAP  --    < > 13 10 13    < > = values in this interval not displayed.     Hematology Recent Labs  Lab 09/14/17 2312 09/15/17 0618 09/16/17 0632  WBC 7.4 7.6 8.5  RBC 2.99* 2.90* 2.91*  HGB 8.3* 7.9* 8.0*  HCT 25.6* 24.7* 25.0*  MCV 85.6 85.2 85.7  MCH 27.7 27.2 27.4  MCHC 32.3 32.0 32.0  RDW 19.1* 18.9* 18.7*  PLT 207 208 233    Cardiac Enzymes Recent Labs  Lab 09/14/17 0710 09/14/17 1104 09/14/17 1635 09/14/17 2312  TROPONINI <0.03 <0.03 <0.03 <0.03   No results for input(s): TROPIPOC in the last 168 hours.    BNP Recent Labs  Lab 09/14/17 0710  BNP 195.0*     DDimer No results for input(s): DDIMER in the last 168 hours.   Radiology    No results found.  Cardiac Studies   Echocardiogram during this admission showed normal LV systolic function with grade 2 diastolic dysfunction, moderate aortic stenosis.  Pulmonary pressure could not be estimated.  Patient Profile     62 y.o. female female with history of CAD as detailed below, HFpEF, mild aortic stenosis, CKD stage IV (baseline ~ 2), anemia of chronic disease, DM, HTN, HLD, hepatitis C, morbid obesity, and OSAwho is being followed for acute on chronic diastolic heart failure.  Assessment & Plan     1. Acute respiratory failure with hypoxia:  Likely multifactorial due to acute on chronic diastolic heart failure, obstructive sleep apnea and obesity hypoventilation syndrome.  2. Acute on chronic diastolic CHF: -She remains volume overloaded in spite of worsening renal function with diuresis. I discussed the case with Dr. Candiss Norse and the plan is to continue furosemide drip for at least another day. Continue blood pressure control.  3. CAD: -No chest pain -ASA and Brilinta -No plans for inpatient ischemic evaluation -Lipitor  4. Hypertensive heart disease: -Blood pressure improved with the addition of carvedilol.  She was supposed to be on this medication as an outpatient but somehow it fell off her medication list. I increased carvedilol to 6.25 mg twice daily. I decreased hydralazine to 75 mg 3 times daily to avoid hypotension.  5. Acute on CKD stage IV: -Nephrology on board -Lasix gtt  -Monitor with diuresis   6. Anemia of chronic disease: -Recommend transfusion to goal HGB > 8.5 -Stable -Per IM  7. Diabetes: -Poorly controlled -Per IM  8. Morbid obesity/OSA/possible OHS: -BiPAP as above     For questions or updates,  please contact [place] Please consult [email or web address] for contact info under  Cardiology/STEMI.      Signed, Kathlyn Sacramento, MD  09/17/2017, 10:02 AM

## 2017-09-17 NOTE — Telephone Encounter (Signed)
Cindy advised via VM.

## 2017-09-17 NOTE — Telephone Encounter (Signed)
[facility]erral placed to [facility]

## 2017-09-17 NOTE — Care Management (Signed)
Patient moved out of icu within the last 24 hours. 02 is acute. She is currently on nasal cannula- 1-3 liters over the last 24 hours.  Anticipate lasix drip for another 24 hours. Reached out to attending regarding PT consult.  patient does have a chronic wound vac on leg. Discussed in progression the need to wean and or perform home 02 assessment priot to discharge.

## 2017-09-18 LAB — BASIC METABOLIC PANEL
Anion gap: 11 (ref 5–15)
BUN: 108 mg/dL — ABNORMAL HIGH (ref 6–20)
CO2: 20 mmol/L — ABNORMAL LOW (ref 22–32)
Calcium: 8.4 mg/dL — ABNORMAL LOW (ref 8.9–10.3)
Chloride: 104 mmol/L (ref 101–111)
Creatinine, Ser: 2.82 mg/dL — ABNORMAL HIGH (ref 0.44–1.00)
GFR calc Af Amer: 20 mL/min — ABNORMAL LOW (ref >60)
GFR calc non Af Amer: 17 mL/min — ABNORMAL LOW (ref >60)
Glucose, Bld: 324 mg/dL — ABNORMAL HIGH (ref 65–99)
Potassium: 3.5 mmol/L (ref 3.5–5.1)
Sodium: 135 mmol/L (ref 135–145)

## 2017-09-18 LAB — GLUCOSE, CAPILLARY
Glucose-Capillary: 290 mg/dL — ABNORMAL HIGH (ref 65–99)
Glucose-Capillary: 268 mg/dL — ABNORMAL HIGH (ref 65–99)
Glucose-Capillary: 195 mg/dL — ABNORMAL HIGH (ref 65–99)
Glucose-Capillary: 244 mg/dL — ABNORMAL HIGH (ref 65–99)
Glucose-Capillary: 218 mg/dL — ABNORMAL HIGH (ref 65–99)

## 2017-09-18 MED ORDER — CARVEDILOL 12.5 MG PO TABS
12.5000 mg | ORAL_TABLET | Freq: Two times a day (BID) | ORAL | Status: DC
  Administered 2017-09-18 – 2017-09-20 (×4): 12.5 mg via ORAL
  Filled 2017-09-18 (×4): qty 1

## 2017-09-18 MED ORDER — FUROSEMIDE 40 MG PO TABS
40.0000 mg | ORAL_TABLET | Freq: Two times a day (BID) | ORAL | Status: DC
  Administered 2017-09-18 – 2017-09-20 (×5): 40 mg via ORAL
  Filled 2017-09-18 (×5): qty 1

## 2017-09-18 MED ORDER — ALUM & MAG HYDROXIDE-SIMETH 200-200-20 MG/5ML PO SUSP: 30.0000 mL | Freq: Four times a day (QID) | ORAL | Status: DC | PRN

## 2017-09-18 NOTE — Progress Notes (Signed)
[facility]  ROUNDING NOTE   Subjective:  Patient had good urine output of 3950 cc with iv lasix infusion Still feels short of breath with exertion Lasix drip discontinued earlier today Serum creatinine has improved slightly to 2.82  Objective:  Vital signs in last 24 hours:  Temp:  [98 F (36.7 C)-99 F (37.2 C)] 98.7 F (37.1 C) (03/09 0425) Pulse Rate:  [90-94] 93 (03/09 0831) Resp:  [18-19] 18 (03/09 0831) BP: (141-172)/(60-66) 172/66 (03/09 0831) SpO2:  [95 %-96 %] 96 % (03/09 0425) Weight:  [137.2 kg (302 lb 6.4 oz)] 137.2 kg (302 lb 6.4 oz) (03/09 0414)  Weight change: -6.623 kg (-9.6 oz) Filed Weights   09/16/17 1730 09/17/17 0355 09/18/17 0414  Weight: (!) 143.8 kg (317 lb) (!) 143.8 kg (317 lb 1.6 oz) (!) 137.2 kg (302 lb 6.4 oz)    Intake/Output: I/O last 3 completed shifts: In: 840 [P.O.:840] Out: 5200 [Urine:5200]   Intake/Output this shift:  No intake/output data recorded.  Physical Exam: General: No acute distress  Head: Normocephalic, atraumatic. Moist oral mucosal membranes  Eyes: Anicteric  Neck: Supple, trachea midline  Lungs:  Mild Basilar rales, normal effort  Heart: S1S2 no rubs  Abdomen:  Soft, nontender, bowel sounds present  Extremities: 2+ peripheral edema.  Neurologic: Awake, alert, following commands          Basic Metabolic Panel: Recent Labs  Lab 09/14/17 0710 09/14/17 2312 09/15/17 0618 09/16/17 0632 09/18/17 0440  NA 139 135 134* 138 135  K 4.4 4.3 4.3 3.9 3.5  CL 109 106 106 107 104  CO2 17* 16* 18* 18* 20*  GLUCOSE 245* 387* 393* 147* 324*  BUN 80* 90* 93* 104* 108*  CREATININE 2.89* 2.86* 2.83* 3.03* 2.82*  CALCIUM 8.0* 8.0* 8.0* 8.1* 8.4*    Liver Function Tests: Recent Labs  Lab 09/14/17 0706  AST 32  ALT 52  ALKPHOS 291*  BILITOT 0.7  PROT 7.6  ALBUMIN 3.7   No results for input(s): LIPASE, AMYLASE in the last 168 hours. No results for input(s): AMMONIA in the last 168  hours.  CBC: Recent Labs  Lab 09/14/17 0710 09/14/17 2312 09/15/17 0618 09/16/17 0632  WBC 10.7 7.4 7.6 8.5  NEUTROABS 8.3* 6.8* 6.4 5.1  HGB 9.1* 8.3* 7.9* 8.0*  HCT 27.8* 25.6* 24.7* 25.0*  MCV 85.4 85.6 85.2 85.7  PLT 272 207 208 233    Cardiac Enzymes: Recent Labs  Lab 09/14/17 0710 09/14/17 1104 09/14/17 1635 09/14/17 2312  TROPONINI <0.03 <0.03 <0.03 <0.03    BNP: Invalid input(s): POCBNP  CBG: Recent Labs  Lab 09/17/17 0840 09/17/17 1216 09/17/17 1634 09/17/17 2126 09/18/17 0757  GLUCAP 116* 140* 159* 135* 290*    Microbiology: Results for orders placed or performed during the hospital encounter of 09/14/17  MRSA PCR Screening     Status: None   Collection Time: 09/14/17  4:40 PM  Result Value Ref Range Status   MRSA by PCR NEGATIVE NEGATIVE Final    Comment:        The GeneXpert MRSA Assay (FDA approved for NASAL specimens only), is one component of a comprehensive MRSA colonization surveillance program. It is not intended to diagnose MRSA infection nor to guide or monitor treatment for MRSA infections. Performed at [facility], [place], [place] [address]   CULTURE, BLOOD (ROUTINE X 2) w Reflex to ID Panel     Status: None (Preliminary result)   Collection Time: 09/14/17 11:11  PM  Result Value Ref Range Status   Specimen Description BLOOD LT [facility]  Final   Special Requests   Final    BOTTLES DRAWN AEROBIC AND ANAEROBIC Blood Culture adequate volume   Culture   Final    NO GROWTH 4 DAYS Performed at [facility], [address], [place], [place] [address]    Report Status PENDING  Incomplete  CULTURE, BLOOD (ROUTINE X 2) w Reflex to ID Panel     Status: None (Preliminary result)   Collection Time: 09/14/17 11:37 PM  Result Value Ref Range Status   Specimen Description BLOOD RT ARM  Final   Special Requests   Final    BOTTLES DRAWN AEROBIC AND ANAEROBIC Blood Culture results may not be optimal due to an  inadequate volume of blood received in culture bottles   Culture   Final    NO GROWTH 4 DAYS Performed at [facility], [address] [place], [place] [address]    Report Status PENDING  Incomplete  Urine Culture     Status: None   Collection Time: 09/15/17  3:18 AM  Result Value Ref Range Status   Specimen Description   Final    URINE, RANDOM Performed at [facility], [address] [place], [place] [address]    Special Requests   Final    NONE Performed at [facility], [address] [place], [place] [address]    Culture   Final    NO GROWTH Performed at [facility], [place] [address], [place],  [address]    Report Status 09/16/2017 FINAL  Final    Coagulation Studies: No results for input(s): LABPROT, INR in the last 72 hours.  Urinalysis: No results for input(s): COLORURINE, LABSPEC, PHURINE, GLUCOSEU, HGBUR, BILIRUBINUR, KETONESUR, PROTEINUR, UROBILINOGEN, NITRITE, LEUKOCYTESUR in the last 72 hours.  Invalid input(s): APPERANCEUR    Imaging: No results found.   Medications:   . sodium chloride     . allopurinol  100 mg Oral Daily  . amLODipine  10 mg Oral Daily  . aspirin EC  81 mg Oral Daily  . atorvastatin  80 mg Oral q1800  . budesonide (PULMICORT) nebulizer solution  0.5 mg Nebulization BID  . carvedilol  6.25 mg Oral BID WC  . colchicine  0.6 mg Oral Daily  . docusate sodium  100 mg Oral Q12H  . ferrous sulfate  325 mg Oral BID WC  . furosemide  40 mg Oral BID  . heparin  5,000 Units Subcutaneous Q8H  . hydrALAZINE  75 mg Oral TID  . insulin aspart  0-15 Units Subcutaneous TID WC  . insulin aspart  0-5 Units Subcutaneous QHS  . insulin aspart  10 Units Subcutaneous TID WC  . insulin glargine  45 Units Subcutaneous BID  . ipratropium-albuterol  3 mL Nebulization Q6H  . isosorbide mononitrate  90 mg Oral Daily  . pantoprazole  40 mg Oral Daily  . pregabalin  100 mg Oral TID  . ticagrelor  90 mg Oral  BID  . valACYclovir  500 mg Oral Daily  . [START ON 10/11/2017] Vitamin D (Ergocalciferol)  50,000 Units Oral Q30 days   sodium chloride, albuterol, fluticasone, hydrALAZINE, hydrOXYzine, hydrOXYzine, ketotifen, nitroGLYCERIN, oxyCODONE-acetaminophen  Assessment/ Plan:  62 y.o. female  African-American female with chronic kidney disease stage III followed by Dr. Arty Baumgartner, [facility], hypertension, coronary artery disease, congestive heart failure with diastolic dysfunction, diabetes mellitus type II, hepatitis C chronic, peptic  ulcer disease, obstructive sleep apnea  1.  Acute renal failure on chronic kidney disease stage III baseline creatinine 1.81/GFR 35 (Aug 05, 2017) 2.  Peripheral edema. 3.  Hypertension, severe upon admission. 4.  Acute respiratory failure, now off of BiPAP.  Plan:  Patient had good response to IV Lasix infusion.  Serum creatinine today has improved slightly however her BUN remains critically elevated.  Patient is currently on room air.  Lower extremity edema appears to be improving slowly.  We will change Lasix to 40 mg orally twice a day. Continue PT for deconditioning.   LOS: 4 [place]one 3/9/20199:07 AM

## 2017-09-18 NOTE — Progress Notes (Addendum)
Progress Note  Patient Name: [name] Date of Encounter: 09/18/2017  Primary Cardiologist: Burt Knack Patient Profile     62 y.o. female with history of CAD with prior LAD intervention,, HFpEF, mild aortic stenosis, CKD stage IV (baseline ~ 2), anemia of chronic disease, DM, HTN, HLD, hepatitis C, morbid obesity, and OSAadmitted with shortness of breath, hypertensive crisis bilateral airspace opacities and hypoxemia Most recent LHC 10/18 patent stent no other significant obstruction. 3/19 echocardiogram EF 65-70 mild LVH and hyperdynamic LV function.  Moderate aortic stenosis with mean gradient of 20 Creatinine 2.8--3  Subjective  Very SOB but a little better,  Had just stripped her bed when I had entered  Inpatient Medications    Scheduled Meds: . allopurinol  100 mg Oral Daily  . amLODipine  10 mg Oral Daily  . aspirin EC  81 mg Oral Daily  . atorvastatin  80 mg Oral q1800  . budesonide (PULMICORT) nebulizer solution  0.5 mg Nebulization BID  . carvedilol  6.25 mg Oral BID WC  . colchicine  0.6 mg Oral Daily  . docusate sodium  100 mg Oral Q12H  . ferrous sulfate  325 mg Oral BID WC  . furosemide  40 mg Oral BID  . heparin  5,000 Units Subcutaneous Q8H  . hydrALAZINE  75 mg Oral TID  . insulin aspart  0-15 Units Subcutaneous TID WC  . insulin aspart  0-5 Units Subcutaneous QHS  . insulin aspart  10 Units Subcutaneous TID WC  . insulin glargine  45 Units Subcutaneous BID  . ipratropium-albuterol  3 mL Nebulization Q6H  . isosorbide mononitrate  90 mg Oral Daily  . pantoprazole  40 mg Oral Daily  . pregabalin  100 mg Oral TID  . ticagrelor  90 mg Oral BID  . valACYclovir  500 mg Oral Daily  . [START ON 10/11/2017] Vitamin D (Ergocalciferol)  50,000 Units Oral Q30 days   Continuous Infusions: . sodium chloride     PRN Meds: sodium chloride, albuterol, fluticasone, hydrALAZINE, hydrOXYzine, hydrOXYzine, ketotifen, nitroGLYCERIN, oxyCODONE-acetaminophen   Vital Signs     Vitals:   09/18/17 0414 09/18/17 0425 09/18/17 0831 09/18/17 0835  BP:  (!) 156/60 (!) 172/66 (!) 172/66  Pulse:  93 93 93  Resp:  19 18 18   Temp:  98.7 F (37.1 C)    TempSrc:  Oral    SpO2:  96%    Weight: (!) 302 lb 6.4 oz (137.2 kg)     Height:        Intake/Output Summary (Last 24 hours) at 09/18/2017 1219 Last data filed at 09/18/2017 1884 Gross per 24 hour  Intake 720 ml  Output 3950 ml  Net -3230 ml   Filed Weights   09/16/17 1730 09/17/17 0355 09/18/17 0414  Weight: (!) 317 lb (143.8 kg) (!) 317 lb 1.6 oz (143.8 kg) (!) 302 lb 6.4 oz (137.2 kg)    Telemetry    Personally reviewed  NSR  ECG    n/a - Personally Reviewed  Physical Exam     Well developed and Morbidly obese in mod resp distress HENT normal Neck supple with JVP unable to discern Clear Regular rate and rhythm, no murmurs or gallops Abd-soft with active BS No Clubbing cyanosis 2+edema Skin-warm and dry A & Oriented  Grossly normal sensory and motor function   Labs    Chemistry Recent Labs  Lab 09/14/17 0706  09/15/17 0618 09/16/17 1660 09/18/17 0440  NA  --    < >  134* 138 135  K  --    < > 4.3 3.9 3.5  CL  --    < > 106 107 104  CO2  --    < > 18* 18* 20*  GLUCOSE  --    < > 393* 147* 324*  BUN  --    < > 93* 104* 108*  CREATININE  --    < > 2.83* 3.03* 2.82*  CALCIUM  --    < > 8.0* 8.1* 8.4*  PROT 7.6  --   --   --   --   ALBUMIN 3.7  --   --   --   --   AST 32  --   --   --   --   ALT 52  --   --   --   --   ALKPHOS 291*  --   --   --   --   BILITOT 0.7  --   --   --   --   GFRNONAA  --    < > 17* 16* 17*  GFRAA  --    < > 20* 18* 20*  ANIONGAP  --    < > 10 13 11    < > = values in this interval not displayed.     Hematology Recent Labs  Lab 09/14/17 2312 09/15/17 0618 09/16/17 0632  WBC 7.4 7.6 8.5  RBC 2.99* 2.90* 2.91*  HGB 8.3* 7.9* 8.0*  HCT 25.6* 24.7* 25.0*  MCV 85.6 85.2 85.7  MCH 27.7 27.2 27.4  MCHC 32.3 32.0 32.0  RDW 19.1* 18.9* 18.7*  PLT  207 208 233    Cardiac Enzymes Recent Labs  Lab 09/14/17 0710 09/14/17 1104 09/14/17 1635 09/14/17 2312  TROPONINI <0.03 <0.03 <0.03 <0.03   No results for input(s): TROPIPOC in the last 168 hours.   BNP Recent Labs  Lab 09/14/17 0710  BNP 195.0*     DDimer No results for input(s): DDIMER in the last 168 hours.   Radiology    US Renal  Result Date: 09/14/2017 IMPRESSION: Negative for hydronephrosis.  No acute abnormality. Increased cortical echogenicity bilaterally consistent with medical renal disease. Electronically Signed   By: Inge Rise M.D.   On: 09/14/2017 09:39   Dg Chest Port 1 View  Result Date: 09/15/2017 IMPRESSION: 1. Overall improved aeration since prior radiograph with mild residual ground-glass opacity bilaterally, likely resolving edema. 2. Cardiomegaly Electronically Signed   By: Donavan Foil M.D.   On: 09/15/2017 03:53   Dg Chest Port 1 View  Result Date: 09/14/2017 IMPRESSION: Diffuse bilateral airspace opacities could reflect edema/CHF or infection. Electronically Signed   By: Rolm Baptise M.D.   On: 09/14/2017 07:36    Cardiac Studies   LHC 04/2017: Coronary Findings   Diagnostic  Dominance: Left  Left Anterior Descending  Prox LAD to Mid LAD lesion 95% stenosed  The lesion was previously treated using a drug eluting stent between 6-12 months ago. Previously placed stent displays no restenosis.  Mid LAD lesion 40% stenosed  Mid LAD lesion.  Dist LAD lesion 10% stenosed  Dist LAD lesion.  First Diagonal Branch  Vessel is moderate in size.  First Septal Branch  Vessel is small in size.  Second Diagonal Branch  Vessel is small in size.  Ost 2nd Diag lesion 60% stenosed  Ost 2nd Diag lesion.  Second Septal Branch  Vessel is small in size.  Third Diagonal Branch  Vessel is small  in size.  Left Circumflex  Mid Cx lesion 20% stenosed  Mid Cx lesion.  First Obtuse Marginal Branch  Vessel is moderate in size.  Ost 1st Mrg to 1st  Mrg lesion 60% stenosed  Ost 1st Mrg to 1st Mrg lesion.  Second Obtuse Marginal Branch  Vessel is moderate in size.  Ost 2nd Mrg to 2nd Mrg lesion 50% stenosed  Ost 2nd Mrg to 2nd Mrg lesion.  Third Obtuse Marginal Branch  Vessel is moderate in size.  Fourth Obtuse Marginal Branch  Vessel is small in size.  Left Posterior Descending Artery  Vessel is moderate in size.  Right Coronary Artery  Vessel is small.  Intervention   No interventions have been documented.  Left Heart   Left Ventricle LV end diastolic pressure is normal.  Coronary Diagrams   Diagnostic Diagram        Conclusion   Conclusions: 1. Widely patent mid LAD stent. 2. No significant change in mild to moderate LCx, OM, and diagonal disease compared with 09/2016. 3. Normal left ventricular filling pressure.  Recommendations: 1. Continue medical therapy and secondary prevention. I will restart isosorbide mononitrate at 90 mg daily with hope of weaning off NTG infusion later today. 2. Gentle hydration given CKD and normal LVEDP. Hold furosemide today and reassess volume status and renal function tomorrow before restarting diuretic therapy.    TTE 09/15/17: Study Conclusions  - Left ventricle: The cavity size was at the upper limits of   normal. Wall thickness was increased in a pattern of mild LVH.   Systolic function was vigorous. The estimated ejection fraction   was in the range of 65% to 70%. Wall motion was normal; there   were no regional wall motion abnormalities. Doppler parameters   are consistent with abnormal left ventricular relaxation (grade 1   diastolic dysfunction). Doppler parameters are consistent with   high ventricular filling pressure. - Aortic valve: Transvalvular velocity was increased. There was   moderate stenosis. There was trivial regurgitation. Peak velocity   (S): 308 cm/s. Mean gradient (S): 20 mm Hg. Valve area (VTI):   1.34 cm^2. Valve area (Vmax): 1.11 cm^2. -  Ascending aorta: The ascending aorta was mildly dilated. - Mitral valve: Calcified annulus. Mildly thickened leaflets . - Left atrium: The atrium was mildly dilated. - Right ventricle: The cavity size was normal. Systolic function   was normal. - Inferior vena cava: The vessel was dilated. The respirophasic   diameter changes were blunted (< 50%), consistent with elevated   central venous pressure. - Pericardium, extracardiac: A trivial pericardial effusion was   identified posterior to the heart.     Assessment & Plan    Acute on chronic diastolic heart failure  Coronary artery disease with prior stenting LAD 3/18  Hypertensive crisis with hypertensive heart disease  Acute on chronic renal insufficiency  Morbid obesity  Obstructive sleep apnea   Good diuresis overnight.  Encouraging that her creatinine is down a little bit. Weights are hard to understand as she is down now 15 pounds overnight 20 pounds since admission  Blood pressures are better.  Will defer management to renal however given her renal insufficiency.  Augmented beta blockade in slowing her heart rate might be of value.  She could also tolerate more hydralazine.  Discussed the long term value of exercise and would recommend Cardiac rehab at discharge   She is non compliant with her CPAP   Discussed potential contributions to her symptoms and the value of  using it-- we will see   For questions or updates, please contact [place] Please consult [email or web address] for contact info under Cardiology/STEMI.    Signed, Christell Faith, PA-C [place] Pager: [phone] 09/18/2017, 12:19 PM

## 2017-09-18 NOTE — Evaluation (Signed)
Physical Therapy Evaluation Patient Details Name: [name] MRN: [id number] DOB: 1955-12-08 Today's Date: 09/18/2017   History of Present Illness  62 y.o. female with a known history of aortic stenosis, asthma, bronchitis, CHF, diabetes is being admitted for acute hypoxic resp failure.  Was here ~5 weeks ago with AKI.  Clinical Impression  Pt initially quite sleepy, but was able to wake and participate with PT exam.  She felt more confident than this PT felt was reasonable as she was unsteady and needing UEs to maintain balance during ambulation for ~35 ft.  Her O2 remained in the 90s t/o the effort but she did feel very fatigued with the minimal effort of walking to the door.  Pt states she is not too far from her baseline; if this is true per today's performance she is questionably safe at home w/o AD.  PT is recommending HHPT.    Follow Up Recommendations Home health PT    Equipment Recommendations       Recommendations for Other Services       Precautions / Restrictions Precautions Precautions: Fall Restrictions Weight Bearing Restrictions: No      Mobility  Bed Mobility Overal bed mobility: Modified Independent             General bed mobility comments: Pt heavily reliant on the rails, slow to rise but able to do so w/o assist, mild dizziness  Transfers Overall transfer level: Needs assistance Equipment used: 1 person hand held assist Transfers: Sit to/from Stand Sit to Stand: Min assist;Mod assist         General transfer comment: Pt attempted to get to standing w/o assist, unable to do so.  Ultimately needed assist to get to standing, general unsteadiness  Ambulation/Gait Ambulation/Gait assistance: Min assist Ambulation Distance (Feet): 35 Feet Assistive device: None       General Gait Details: Pt adamant that she could walk w/o HHA or RW, needed to hold counter top, wall, funiture t/o the entire effort and generally was unsafe and showed poor  balance.  She reports this is not too far from baseline, PT encouraged her to use ADs.  Stairs            Wheelchair Mobility    Modified Rankin (Stroke Patients Only)       Balance Overall balance assessment: Needs assistance Sitting-balance support: No upper extremity supported;Feet supported Sitting balance-Leahy Scale: Good     Standing balance support: No upper extremity supported Standing balance-Leahy Scale: Poor Standing balance comment: Pt with general unsteadiness, more confident than she ought to have been                             Pertinent Vitals/Pain Pain Assessment: No/denies pain    Home Living Family/patient expects to be discharged to:: Private residence Living Arrangements: Children Available Help at Discharge: Family;Available PRN/intermittently Type of Home: House Home Access: Stairs to enter Entrance Stairs-Rails: Right;Left;Can reach both Entrance Stairs-Number of Steps: 4 Home Layout: One level Home Equipment: Walker - 2 wheels;Cane - single point      Prior Function Level of Independence: Independent with assistive device(s)         Comments: Pt only out of the house for MD appointments, uses cane or funiture in the house and can get dressed, etc w/o assist     Hand Dominance        Extremity/Trunk Assessment   Upper Extremity Assessment Upper Extremity  Assessment: Generalized weakness;Overall [facility] for tasks assessed    Lower Extremity Assessment Lower Extremity Assessment: Overall WFL for tasks assessed;Generalized weakness       Communication   Communication: No difficulties  Cognition Arousal/Alertness: Lethargic Behavior During Therapy: WFL for tasks assessed/performed Overall Cognitive Status: Difficult to assess                                 General Comments: Pt took some time waking up, ultimately seemed mildly confused t/o the exam      General Comments      Exercises      Assessment/Plan    PT Assessment Patient needs continued PT services  PT Problem List Decreased strength;Decreased range of motion;Decreased activity tolerance;Decreased balance;Decreased mobility;Decreased coordination;Decreased knowledge of use of DME;Decreased safety awareness       PT Treatment Interventions DME instruction;Gait training;Stair training;Functional mobility training;Therapeutic activities;Therapeutic exercise;Balance training;Neuromuscular re-education;Patient/family education    PT Goals (Current goals can be found in the Care Plan section)  Acute Rehab PT Goals Patient Stated Goal: go home  PT Goal Formulation: With patient Time For Goal Achievement: 10/02/17 Potential to Achieve Goals: Good    Frequency Min 2X/week   Barriers to discharge        Co-evaluation               AM-PAC PT "6 Clicks" Daily Activity  Outcome Measure Difficulty turning over in bed (including adjusting bedclothes, sheets and blankets)?: A Little Difficulty moving from lying on back to sitting on the side of the bed? : A Lot Difficulty sitting down on and standing up from a chair with arms (e.g., wheelchair, bedside commode, etc,.)?: A Little Help needed moving to and from a bed to chair (including a wheelchair)?: A Little Help needed walking in hospital room?: A Little Help needed climbing 3-5 steps with a railing? : A Lot 6 Click Score: 16    End of Session Equipment Utilized During Treatment: Gait belt Activity Tolerance: Patient limited by fatigue Patient left: with bed alarm set;with call bell/phone within reach   PT Visit Diagnosis: Muscle weakness (generalized) (M62.81);Difficulty in walking, not elsewhere classified (R26.2)    Time: 5183-4373 PT Time Calculation (min) (ACUTE ONLY): 28 min   Charges:   PT Evaluation $PT Eval Low Complexity: 1 Low     PT G Codes:        Kreg Shropshire, DPT 09/18/2017, 5:48 PM

## 2017-09-18 NOTE — Progress Notes (Signed)
[place] at [facility]                                                                                                                                                                                  Patient Demographics   [name], is a 62 y.o. female, DOB - 1955/11/07, YIF:[id number]  Admit date - 09/14/2017   Admitting Physician Max Sane, MD  Outpatient Primary MD for the patient is [name], PA-C   LOS - 4  Subjective: SOB is better, but reports she is very tired   Review of Systems:   CONSTITUTIONAL: No documented fever. No fatigue, weakness. No weight gain, no weight loss.  EYES: No blurry or double vision.  ENT: No tinnitus. No postnasal drip. No redness of the oropharynx.  RESPIRATORY: No cough, no wheeze, no hemoptysis.  Has shortness of breath CARDIOVASCULAR: No chest pain. Has orthopnea. No palpitations. No syncope.  Has leg edema. GASTROINTESTINAL: No nausea, no vomiting or diarrhea. No abdominal pain. No melena or hematochezia.  GENITOURINARY: No dysuria or hematuria.  ENDOCRINE: No polyuria or nocturia. No heat or cold intolerance.  HEMATOLOGY: No anemia. No bruising. No bleeding.  INTEGUMENTARY: No rashes. No lesions.  MUSCULOSKELETAL: No arthritis. Leg  Swelling present. No gout.  NEUROLOGIC: No numbness, tingling, or ataxia. No seizure-type activity.  PSYCHIATRIC: No anxiety. No insomnia. No ADD.    Vitals:   Vitals:   09/18/17 0425 09/18/17 0831 09/18/17 0835 09/18/17 1355  BP: (!) 156/60 (!) 172/66 (!) 172/66   Pulse: 93 93 93   Resp: 19 18 18    Temp: 98.7 F (37.1 C)     TempSrc: Oral     SpO2: 96%   98%  Weight:      Height:        Wt Readings from Last 3 Encounters:  09/18/17 (!) 137.2 kg (302 lb 6.4 oz)  09/01/17 136.1 kg (300 lb)  08/19/17 (!) 148.3 kg (326 lb 14.4 oz)     Intake/Output Summary (Last 24 hours) at 09/18/2017 1508 Last data filed at 09/18/2017 1404 Gross per 24 hour  Intake 840 ml   Output 3050 ml  Net -2210 ml    Physical Exam:   GENERAL: Pleasant-appearing female patient lying on the bed on oxygen via nasal canula.[name]  HEAD, EYES, EARS, NOSE AND THROAT: Atraumatic, normocephalic. Extraocular muscles are intact. Pupils equal and reactive to light. Sclerae anicteric. No conjunctival injection. No oro-pharyngeal erythema.  NECK: Supple. There is no jugular venous distention. No bruits, no lymphadenopathy, no thyromegaly.  HEART: Regular rate and rhythm,. No murmurs, no rubs, no clicks.  LUNGS: Clear to auscultation bilaterally. No rales  or rhonchi. No wheezes.  ABDOMEN: Soft, flat, nontender, nondistended. Has good bowel sounds. No hepatosplenomegaly appreciated.  EXTREMITIES: No evidence of any cyanosis, clubbing.  +2 pedal and radial pulses bilaterally.  Has pedal edema bilaterally NEUROLOGIC: The patient is alert, awake, and oriented x3 with no focal motor or sensory deficits appreciated bilaterally.  SKIN: Moist and warm with no rashes appreciated.  Psych: Not anxious, depressed LN: No inguinal LN enlargement    Antibiotics   Anti-infectives (From admission, onward)   Start     Dose/Rate Route Frequency Ordered Stop   09/14/17 1315  valACYclovir (VALTREX) tablet 500 mg     500 mg Oral Daily 09/14/17 1310        Medications   Scheduled Meds: . allopurinol  100 mg Oral Daily  . amLODipine  10 mg Oral Daily  . aspirin EC  81 mg Oral Daily  . atorvastatin  80 mg Oral q1800  . budesonide (PULMICORT) nebulizer solution  0.5 mg Nebulization BID  . carvedilol  6.25 mg Oral BID WC  . colchicine  0.6 mg Oral Daily  . docusate sodium  100 mg Oral Q12H  . ferrous sulfate  325 mg Oral BID WC  . furosemide  40 mg Oral BID  . heparin  5,000 Units Subcutaneous Q8H  . hydrALAZINE  75 mg Oral TID  . insulin aspart  0-15 Units Subcutaneous TID WC  . insulin aspart  0-5 Units Subcutaneous QHS  . insulin aspart  10 Units Subcutaneous TID WC  . insulin glargine  45  Units Subcutaneous BID  . ipratropium-albuterol  3 mL Nebulization Q6H  . isosorbide mononitrate  90 mg Oral Daily  . pantoprazole  40 mg Oral Daily  . pregabalin  100 mg Oral TID  . ticagrelor  90 mg Oral BID  . valACYclovir  500 mg Oral Daily  . [START ON 10/11/2017] Vitamin D (Ergocalciferol)  50,000 Units Oral Q30 days   Continuous Infusions: . sodium chloride     PRN Meds:.sodium chloride, albuterol, fluticasone, hydrALAZINE, hydrOXYzine, hydrOXYzine, ketotifen, nitroGLYCERIN, oxyCODONE-acetaminophen   Data Review:   Micro Results Recent Results (from the past 240 hour(s))  MRSA PCR Screening     Status: None   Collection Time: 09/14/17  4:40 PM  Result Value Ref Range Status   MRSA by PCR NEGATIVE NEGATIVE Final    Comment:        The GeneXpert MRSA Assay (FDA approved for NASAL specimens only), is one component of a comprehensive MRSA colonization surveillance program. It is not intended to diagnose MRSA infection nor to guide or monitor treatment for MRSA infections. Performed at [facility], [address] [place], [place] [address]   CULTURE, BLOOD (ROUTINE X 2) w Reflex to ID Panel     Status: None (Preliminary result)   Collection Time: 09/14/17 11:11 PM  Result Value Ref Range Status   Specimen Description BLOOD LT [facility]  Final   Special Requests   Final    BOTTLES DRAWN AEROBIC AND ANAEROBIC Blood Culture adequate volume   Culture   Final    NO GROWTH 4 DAYS Performed at [facility], [address] [place], [place] [address]    Report Status PENDING  Incomplete  CULTURE, BLOOD (ROUTINE X 2) w Reflex to ID Panel     Status: None (Preliminary result)   Collection Time: 09/14/17 11:37 PM  Result Value Ref Range Status   Specimen Description BLOOD RT ARM  Final   Special  Requests   Final    BOTTLES DRAWN AEROBIC AND ANAEROBIC Blood Culture results may not be optimal due to an inadequate volume of blood received in culture bottles    Culture   Final    NO GROWTH 4 DAYS Performed at [facility], [address], [place], [place] [address]    Report Status PENDING  Incomplete  Urine Culture     Status: None   Collection Time: 09/15/17  3:18 AM  Result Value Ref Range Status   Specimen Description   Final    URINE, RANDOM Performed at [facility], [address] [place], [place] [address]    Special Requests   Final    NONE Performed at [facility], [address], [place], [place] [address]    Culture   Final    NO GROWTH Performed at [facility], [place] [address], [place], [place] [address]    Report Status 09/16/2017 FINAL  Final    Radiology Reports US Renal  Result Date: 09/14/2017 CLINICAL DATA:  Shortness of breath for 2 days.  Oliguria. EXAM: RENAL / URINARY TRACT ULTRASOUND COMPLETE COMPARISON:  Renal ultrasound 03/19/2017. FINDINGS: Right Kidney: Length: 11.8 cm. Cortical echogenicity is increased. No hydronephrosis. Two small cysts are identified measuring approximately 1 cm in diameter. Left Kidney: Length: 11.2 cm. Cortical echogenicity is increased. No hydronephrosis. Single simple cyst measures 2.4 cm in diameter. Bladder: Appears normal for degree of bladder distention. IMPRESSION: Negative for hydronephrosis.  No acute abnormality. Increased cortical echogenicity bilaterally consistent with medical renal disease. Electronically Signed   By: Inge Rise M.D.   On: 09/14/2017 09:39   Dg Chest Port 1 View  Result Date: 09/15/2017 CLINICAL DATA:  Respiratory failure EXAM: PORTABLE CHEST 1 VIEW COMPARISON:  09/14/2017, 07/15/2017 FINDINGS: Cardiomegaly. Subtle bilateral ground-glass opacities. No pleural effusion. No pneumothorax. IMPRESSION: 1. Overall improved aeration since prior radiograph with mild residual ground-glass opacity bilaterally, likely resolving edema. 2. Cardiomegaly Electronically Signed   By: Donavan Foil M.D.   On: 09/15/2017 03:53   Dg Chest  Port 1 View  Result Date: 09/14/2017 CLINICAL DATA:  Shortness of Breath EXAM: PORTABLE CHEST 1 VIEW COMPARISON:  09/04/2017 FINDINGS: Cardiomegaly. Diffuse bilateral airspace opacities are new since prior study. No visible effusions. No acute bony abnormality. IMPRESSION: Diffuse bilateral airspace opacities could reflect edema/CHF or infection. Electronically Signed   By: Rolm Baptise M.D.   On: 09/14/2017 07:36     CBC Recent Labs  Lab 09/14/17 0710 09/14/17 2312 09/15/17 0618 09/16/17 0632  WBC 10.7 7.4 7.6 8.5  HGB 9.1* 8.3* 7.9* 8.0*  HCT 27.8* 25.6* 24.7* 25.0*  PLT 272 207 208 233  MCV 85.4 85.6 85.2 85.7  MCH 28.0 27.7 27.2 27.4  MCHC 32.8 32.3 32.0 32.0  RDW 18.9* 19.1* 18.9* 18.7*  LYMPHSABS 1.1 0.4* 0.6* 1.9  MONOABS 1.0* 0.2 0.6 1.3*  EOSABS 0.2 0.0 0.0 0.1  BASOSABS 0.1 0.0 0.0 0.0    Chemistries  Recent Labs  Lab 09/14/17 0706 09/14/17 0710 09/14/17 2312 09/15/17 0618 09/16/17 0632 09/18/17 0440  NA  --  139 135 134* 138 135  K  --  4.4 4.3 4.3 3.9 3.5  CL  --  109 106 106 107 104  CO2  --  17* 16* 18* 18* 20*  GLUCOSE  --  245* 387* 393* 147* 324*  BUN  --  80* 90* 93* 104* 108*  CREATININE  --  2.89* 2.86* 2.83* 3.03*  2.82*  CALCIUM  --  8.0* 8.0* 8.0* 8.1* 8.4*  AST 32  --   --   --   --   --   ALT 52  --   --   --   --   --   ALKPHOS 291*  --   --   --   --   --   BILITOT 0.7  --   --   --   --   --    ------------------------------------------------------------------------------------------------------------------ estimated creatinine clearance is 31.6 mL/min (A) (by C-G formula based on SCr of 2.82 mg/dL (H)). ------------------------------------------------------------------------------------------------------------------ No results for input(s): HGBA1C in the last 72 hours. ------------------------------------------------------------------------------------------------------------------ No results for input(s): CHOL, HDL, LDLCALC, TRIG,  CHOLHDL, LDLDIRECT in the last 72 hours. ------------------------------------------------------------------------------------------------------------------ No results for input(s): TSH, T4TOTAL, T3FREE, THYROIDAB in the last 72 hours.  Invalid input(s): FREET3 ------------------------------------------------------------------------------------------------------------------ No results for input(s): VITAMINB12, FOLATE, FERRITIN, TIBC, IRON, RETICCTPCT in the last 72 hours.  Coagulation profile Recent Labs  Lab 09/14/17 0706  INR 0.98    No results for input(s): DDIMER in the last 72 hours.  Cardiac Enzymes Recent Labs  Lab 09/14/17 1104 09/14/17 1635 09/14/17 2312  TROPONINI <0.03 <0.03 <0.03   ------------------------------------------------------------------------------------------------------------------ Invalid input(s): POCBNP    Assessment & Plan   62 y.o.femalewith a hx of CAD,mild aortic stenosis, CKD stage IV,anemia of chronic kidneydisease, DM, HTN, HLD, hepatitis C, morbid obesity, and OSAadmitted for acute hypoxic resp failure and heart failure exacerbation.  *Acutehypoxicrespiratory failure with Acute on chronic diastolic Congestive heart failure Clinically much better [facility] following - Lasix drip to p.o. Lasix twice a day -Echocardiogram with ejection fraction 65-70% - Daily weights and strict Is and Os  *Acute kidney injury over CKD stage III On Lasix p.o. twice daily.  Discussed with Dr. Candiss Norse.   No dialysis for now -Creatinine 3.03 -2.82 today  *CAD: -ASA, Coreg, Imdur ,Lipitorand Brilinta -Cardio following  * MalignantHypertension - BP improved -off Nitroglycerin drip -ContinueAmlodipine, Hydralazine, Imdur.  Increased the dose of Coreg - Not on beta blocker currently given respiratory status.  *Generalized weakness Needs PT assessment for deconditioning   All the records are reviewed and case discussed with  Care Management/Social Worker. Management plans discussed with the patient, family and they are in agreement.  CODE STATUS: Full Code       Code Status Orders  (From admission, onward)        Start     Ordered   09/14/17 1311  Full code  Continuous     09/14/17 1310    Code Status History    Date Active Date Inactive Code Status Order ID Comments User Context   08/15/2017 23:06 08/19/2017 20:39 Full Code [id number]  Robert Bellow, MD Inpatient   08/11/2017 13:50 08/15/2017 23:06 Full Code [id number]  Demetrios Loll, MD Inpatient   06/02/2017 02:34 06/06/2017 15:36 Full Code [id number]  Caren Griffins, MD Inpatient   04/28/2017 05:16 05/02/2017 16:24 Full Code [id number]  Rise Patience, MD ED   03/17/2017 12:22 03/19/2017 20:30 Full Code [id number]  Liliane Shi, PA-C ED   12/13/2016 03:45 12/14/2016 16:47 Full Code [id number]  Reubin Milan, MD Inpatient   09/13/2016 11:49 09/19/2016 14:58 Full Code [id number]  Flossie Dibble, MD ED   09/04/2016 01:37 09/05/2016 18:01 Full Code [id number]  Etta Quill, DO ED   08/21/2016 18:46 08/22/2016 17:33 Full Code [id number]  Thurnell Lose, MD Inpatient   03/18/2015 09:39 03/19/2015 18:02 Full Code  [id number]  Dellia Nims, MD Inpatient   02/15/2015 17:00 02/18/2015 18:17 Full Code [id number]  Francesca Oman, DO Inpatient   10/02/2014 15:24 10/04/2014 16:49 Full Code [id number]  Samella Parr, NP Inpatient   03/25/2012 05:31 04/03/2012 19:31 Full Code [id number]  Maudry Diego, RN Inpatient      Lab Results  Component Value Date   PLT 233 09/16/2017     Time Spent in minutes   25 minutes  Nicholes Mango M.D on 09/18/2017 at 3:08 PM  Between 7am to 6pm - Pager - [phone]  After 6pm go to [email or web address] - Proofreader  [employer]   Office  [phone]

## 2017-09-19 LAB — GLUCOSE, CAPILLARY
Glucose-Capillary: 157 mg/dL — ABNORMAL HIGH (ref 65–99)
Glucose-Capillary: 177 mg/dL — ABNORMAL HIGH (ref 65–99)
Glucose-Capillary: 156 mg/dL — ABNORMAL HIGH (ref 65–99)
Glucose-Capillary: 175 mg/dL — ABNORMAL HIGH (ref 65–99)

## 2017-09-19 LAB — CBC
HCT: 25.4 % — ABNORMAL LOW (ref 35.0–47.0)
Hemoglobin: 8.3 g/dL — ABNORMAL LOW (ref 12.0–16.0)
MCH: 27.4 pg (ref 26.0–34.0)
MCHC: 32.7 g/dL (ref 32.0–36.0)
MCV: 83.8 fL (ref 80.0–100.0)
Platelets: 224 10*3/uL (ref 150–440)
RBC: 3.03 MIL/uL — ABNORMAL LOW (ref 3.80–5.20)
RDW: 18.3 % — ABNORMAL HIGH (ref 11.5–14.5)
WBC: 5.9 10*3/uL (ref 3.6–11.0)

## 2017-09-19 LAB — BASIC METABOLIC PANEL
Anion gap: 12 (ref 5–15)
BUN: 105 mg/dL — ABNORMAL HIGH (ref 6–20)
CO2: 21 mmol/L — ABNORMAL LOW (ref 22–32)
Calcium: 8.4 mg/dL — ABNORMAL LOW (ref 8.9–10.3)
Chloride: 104 mmol/L (ref 101–111)
Creatinine, Ser: 2.62 mg/dL — ABNORMAL HIGH (ref 0.44–1.00)
GFR calc Af Amer: 22 mL/min — ABNORMAL LOW (ref >60)
GFR calc non Af Amer: 19 mL/min — ABNORMAL LOW (ref >60)
Glucose, Bld: 189 mg/dL — ABNORMAL HIGH (ref 65–99)
Potassium: 3.6 mmol/L (ref 3.5–5.1)
Sodium: 137 mmol/L (ref 135–145)

## 2017-09-19 LAB — CULTURE, BLOOD (ROUTINE X 2)
Culture: NO GROWTH
Culture: NO GROWTH
Special Requests: ADEQUATE

## 2017-09-19 MED ORDER — SODIUM CHLORIDE 0.9% FLUSH
3.0000 mL | Freq: Two times a day (BID) | INTRAVENOUS | Status: DC
  Administered 2017-09-19 – 2017-09-20 (×2): 3 mL via INTRAVENOUS

## 2017-09-19 NOTE — Progress Notes (Signed)
[place] [facility]                                                                                                                                                                                  Patient Demographics   [name], is a 62 y.o. female, DOB - March 21, 1956, [id number]  Admit date - 09/14/2017   Admitting Physician Max Sane, MD  Outpatient Primary MD for the patient is [name], PA-C   LOS - 5  Subjective: SOB is better, but reports she usually feels tired and sleepy in the morning No new complaints.  No overnight events   Review of Systems:   CONSTITUTIONAL: No documented fever. No fatigue, weakness. No weight gain, no weight loss.  EYES: No blurry or double vision.  ENT: No tinnitus. No postnasal drip. No redness of the oropharynx.  RESPIRATORY: No cough, no wheeze, no hemoptysis.  Has shortness of breath CARDIOVASCULAR: No chest pain. Has orthopnea. No palpitations. No syncope.  Has leg edema. GASTROINTESTINAL: No nausea, no vomiting or diarrhea. No abdominal pain. No melena or hematochezia.  GENITOURINARY: No dysuria or hematuria.  ENDOCRINE: No polyuria or nocturia. No heat or cold intolerance.  HEMATOLOGY: No anemia. No bruising. No bleeding.  INTEGUMENTARY: No rashes. No lesions.  MUSCULOSKELETAL: No arthritis. Leg  Swelling present. No gout.  NEUROLOGIC: No numbness, tingling, or ataxia. No seizure-type activity.  PSYCHIATRIC: No anxiety. No insomnia. No ADD.    Vitals:   Vitals:   09/18/17 2100 09/19/17 0341 09/19/17 0714 09/19/17 0840  BP:  (!) 141/59 (!) 171/72   Pulse:  81 86   Resp:  18 20   Temp:  98.4 F (36.9 C)    TempSrc:  Oral    SpO2: 95% 100%  95%  Weight:  (!) 140.2 kg (309 lb)    Height:        Wt Readings from Last 3 Encounters:  09/19/17 (!) 140.2 kg (309 lb)  09/01/17 136.1 kg (300 lb)  08/19/17 (!) 148.3 kg (326 lb 14.4 oz)     Intake/Output Summary (Last 24 hours) at  09/19/2017 1110 Last data filed at 09/19/2017 0943 Gross per 24 hour  Intake 600 ml  Output 1500 ml  Net -900 ml    Physical Exam:   GENERAL: Pleasant-appearing female patient lying on the bed on oxygen via nasal canula.[name]  HEAD, EYES, EARS, NOSE AND THROAT: Atraumatic, normocephalic. Extraocular muscles are intact. Pupils equal and reactive to light. Sclerae anicteric. No conjunctival injection. No oro-pharyngeal erythema.  NECK: Supple. There is no jugular venous distention. No bruits, no lymphadenopathy, no thyromegaly.  HEART: Regular rate and rhythm,. No  murmurs, no rubs, no clicks.  LUNGS: Clear to auscultation bilaterally. No rales or rhonchi. No wheezes.  ABDOMEN: Soft, flat, nontender, nondistended. Has good bowel sounds. No hepatosplenomegaly appreciated.  EXTREMITIES: No evidence of any cyanosis, clubbing.  +2 pedal and radial pulses bilaterally.  Has pedal edema bilaterally NEUROLOGIC: The patient is alert, awake, and oriented x3 with no focal motor or sensory deficits appreciated bilaterally.  SKIN: Moist and warm with no rashes appreciated.  Psych: Not anxious, depressed LN: No inguinal LN enlargement    Antibiotics   Anti-infectives (From admission, onward)   Start     Dose/Rate Route Frequency Ordered Stop   09/14/17 1315  valACYclovir (VALTREX) tablet 500 mg     500 mg Oral Daily 09/14/17 1310        Medications   Scheduled Meds: . allopurinol  100 mg Oral Daily  . amLODipine  10 mg Oral Daily  . aspirin EC  81 mg Oral Daily  . atorvastatin  80 mg Oral q1800  . budesonide (PULMICORT) nebulizer solution  0.5 mg Nebulization BID  . carvedilol  12.5 mg Oral BID WC  . colchicine  0.6 mg Oral Daily  . docusate sodium  100 mg Oral Q12H  . ferrous sulfate  325 mg Oral BID WC  . furosemide  40 mg Oral BID  . heparin  5,000 Units Subcutaneous Q8H  . hydrALAZINE  75 mg Oral TID  . insulin aspart  0-15 Units Subcutaneous TID WC  . insulin aspart  0-5 Units  Subcutaneous QHS  . insulin aspart  10 Units Subcutaneous TID WC  . insulin glargine  45 Units Subcutaneous BID  . ipratropium-albuterol  3 mL Nebulization Q6H  . isosorbide mononitrate  90 mg Oral Daily  . pantoprazole  40 mg Oral Daily  . pregabalin  100 mg Oral TID  . ticagrelor  90 mg Oral BID  . valACYclovir  500 mg Oral Daily  . [START ON 10/11/2017] Vitamin D (Ergocalciferol)  50,000 Units Oral Q30 days   Continuous Infusions: . sodium chloride     PRN Meds:.sodium chloride, albuterol, alum & mag hydroxide-simeth, fluticasone, hydrALAZINE, hydrOXYzine, hydrOXYzine, ketotifen, nitroGLYCERIN, oxyCODONE-acetaminophen   Data Review:   Micro Results Recent Results (from the past 240 hour(s))  MRSA PCR Screening     Status: None   Collection Time: 09/14/17  4:40 PM  Result Value Ref Range Status   MRSA by PCR NEGATIVE NEGATIVE Final    Comment:        The GeneXpert MRSA Assay (FDA approved for NASAL specimens only), is one component of a comprehensive MRSA colonization surveillance program. It is not intended to diagnose MRSA infection nor to guide or monitor treatment for MRSA infections. Performed at [facility], [address] [place], [place] [address]   CULTURE, BLOOD (ROUTINE X 2) w Reflex to ID Panel     Status: None   Collection Time: 09/14/17 11:11 PM  Result Value Ref Range Status   Specimen Description BLOOD LT [facility]  Final   Special Requests   Final    BOTTLES DRAWN AEROBIC AND ANAEROBIC Blood Culture adequate volume   Culture   Final    NO GROWTH 5 DAYS Performed at [facility], [address], [place], [place] [address]    Report Status 09/19/2017 FINAL  Final  CULTURE, BLOOD (ROUTINE X 2) w Reflex to ID Panel     Status: None   Collection Time: 09/14/17 11:37 PM  Result Value Ref  Range Status   Specimen Description BLOOD RT ARM  Final   Special Requests   Final    BOTTLES DRAWN AEROBIC AND ANAEROBIC Blood Culture results may not  be optimal due to an inadequate volume of blood received in culture bottles   Culture   Final    NO GROWTH 5 DAYS Performed at [facility], [address], [place], [place] [address]    Report Status 09/19/2017 FINAL  Final  Urine Culture     Status: None   Collection Time: 09/15/17  3:18 AM  Result Value Ref Range Status   Specimen Description   Final    URINE, RANDOM Performed at [facility], [address], [place], [place] [address]    Special Requests   Final    NONE Performed at [facility], [address], [place], [place] [address]    Culture   Final    NO GROWTH Performed at [facility], [place] [address], [place], [place] [address]    Report Status 09/16/2017 FINAL  Final    Radiology Reports US Renal  Result Date: 09/14/2017 CLINICAL DATA:  Shortness of breath for 2 days.  Oliguria. EXAM: RENAL / URINARY TRACT ULTRASOUND COMPLETE COMPARISON:  Renal ultrasound 03/19/2017. FINDINGS: Right Kidney: Length: 11.8 cm. Cortical echogenicity is increased. No hydronephrosis. Two small cysts are identified measuring approximately 1 cm in diameter. Left Kidney: Length: 11.2 cm. Cortical echogenicity is increased. No hydronephrosis. Single simple cyst measures 2.4 cm in diameter. Bladder: Appears normal for degree of bladder distention. IMPRESSION: Negative for hydronephrosis.  No acute abnormality. Increased cortical echogenicity bilaterally consistent with medical renal disease. Electronically Signed   By: Inge Rise M.D.   On: 09/14/2017 09:39   Dg Chest Port 1 View  Result Date: 09/15/2017 CLINICAL DATA:  Respiratory failure EXAM: PORTABLE CHEST 1 VIEW COMPARISON:  09/14/2017, 07/15/2017 FINDINGS: Cardiomegaly. Subtle bilateral ground-glass opacities. No pleural effusion. No pneumothorax. IMPRESSION: 1. Overall improved aeration since prior radiograph with mild residual ground-glass opacity bilaterally, likely resolving edema. 2. Cardiomegaly  Electronically Signed   By: Donavan Foil M.D.   On: 09/15/2017 03:53   Dg Chest Port 1 View  Result Date: 09/14/2017 CLINICAL DATA:  Shortness of Breath EXAM: PORTABLE CHEST 1 VIEW COMPARISON:  09/04/2017 FINDINGS: Cardiomegaly. Diffuse bilateral airspace opacities are new since prior study. No visible effusions. No acute bony abnormality. IMPRESSION: Diffuse bilateral airspace opacities could reflect edema/CHF or infection. Electronically Signed   By: Rolm Baptise M.D.   On: 09/14/2017 07:36     CBC Recent Labs  Lab 09/14/17 0710 09/14/17 2312 09/15/17 0618 09/16/17 0632 09/19/17 0457  WBC 10.7 7.4 7.6 8.5 5.9  HGB 9.1* 8.3* 7.9* 8.0* 8.3*  HCT 27.8* 25.6* 24.7* 25.0* 25.4*  PLT 272 207 208 233 224  MCV 85.4 85.6 85.2 85.7 83.8  MCH 28.0 27.7 27.2 27.4 27.4  MCHC 32.8 32.3 32.0 32.0 32.7  RDW 18.9* 19.1* 18.9* 18.7* 18.3*  LYMPHSABS 1.1 0.4* 0.6* 1.9  --   MONOABS 1.0* 0.2 0.6 1.3*  --   EOSABS 0.2 0.0 0.0 0.1  --   BASOSABS 0.1 0.0 0.0 0.0  --     Chemistries  Recent Labs  Lab 09/14/17 0706  09/14/17 2312 09/15/17 0618 09/16/17 0632 09/18/17 0440 09/19/17 0457  NA  --    < > 135 134* 138 135 137  K  --    < > 4.3 4.3 3.9 3.5 3.6  CL  --    < >  106 106 107 104 104  CO2  --    < > 16* 18* 18* 20* 21*  GLUCOSE  --    < > 387* 393* 147* 324* 189*  BUN  --    < > 90* 93* 104* 108* 105*  CREATININE  --    < > 2.86* 2.83* 3.03* 2.82* 2.62*  CALCIUM  --    < > 8.0* 8.0* 8.1* 8.4* 8.4*  AST 32  --   --   --   --   --   --   ALT 52  --   --   --   --   --   --   ALKPHOS 291*  --   --   --   --   --   --   BILITOT 0.7  --   --   --   --   --   --    < > = values in this interval not displayed.   ------------------------------------------------------------------------------------------------------------------ estimated creatinine clearance is 34.5 mL/min (A) (by C-G formula based on SCr of 2.62 mg/dL  (H)). ------------------------------------------------------------------------------------------------------------------ No results for input(s): HGBA1C in the last 72 hours. ------------------------------------------------------------------------------------------------------------------ No results for input(s): CHOL, HDL, LDLCALC, TRIG, CHOLHDL, LDLDIRECT in the last 72 hours. ------------------------------------------------------------------------------------------------------------------ No results for input(s): TSH, T4TOTAL, T3FREE, THYROIDAB in the last 72 hours.  Invalid input(s): FREET3 ------------------------------------------------------------------------------------------------------------------ No results for input(s): VITAMINB12, FOLATE, FERRITIN, TIBC, IRON, RETICCTPCT in the last 72 hours.  Coagulation profile Recent Labs  Lab 09/14/17 0706  INR 0.98    No results for input(s): DDIMER in the last 72 hours.  Cardiac Enzymes Recent Labs  Lab 09/14/17 1104 09/14/17 1635 09/14/17 2312  TROPONINI <0.03 <0.03 <0.03   ------------------------------------------------------------------------------------------------------------------ Invalid input(s): POCBNP    Assessment & Plan   62 y.o.femalewith a hx of CAD,mild aortic stenosis, CKD stage IV,anemia of chronic kidneydisease, DM, HTN, HLD, hepatitis C, morbid obesity, and OSAadmitted for acute hypoxic resp failure and heart failure exacerbation.  *Acutehypoxicrespiratory failure with Acute on chronic diastolic Congestive heart failure Clinically much better [facility] following - Lasix drip to p.o. Lasix twice a day from 09/18/2017 -Echocardiogram with ejection fraction 65-70% - Daily weights and strict Is and Os  *Acute kidney injury over CKD stage III On Lasix p.o. twice daily.  Discussed with Dr. Candiss Norse.   No dialysis for now as patient is clinically improving.  Baseline creatinine is  1.8 -Creatinine 3.03 -2.82--2.62 today  *CAD: -ASA, Coreg, Imdur ,Lipitorand Brilinta -Cardio following  * MalignantHypertension - BP improved -off Nitroglycerin drip -ContinueAmlodipine, Hydralazine, Imdur.  Increased the dose of Coreg - Not on beta blocker currently given respiratory status.  *Generalized weakness Physical therapy is recommending home health PT  Plan is to discharge patient home tomorrow if  renal function continues to improve All the records are reviewed and case discussed with Care Management/Social Worker. Management plans discussed with the patient, family and they are in agreement.  CODE STATUS: Full Code       Code Status Orders  (From admission, onward)        Start     Ordered   09/14/17 1311  Full code  Continuous     09/14/17 1310    Code Status History    Date Active Date Inactive Code Status Order ID Comments User Context   08/15/2017 23:06 08/19/2017 20:39 Full Code [id number]  Robert Bellow, MD Inpatient   08/11/2017 13:50 08/15/2017 23:06 Full Code [id number]  Demetrios Loll, MD  Inpatient   06/02/2017 02:34 06/06/2017 15:36 Full Code [id number]  Caren Griffins, MD Inpatient   04/28/2017 05:16 05/02/2017 16:24 Full Code [id number]  Rise Patience, MD ED   03/17/2017 12:22 03/19/2017 20:30 Full Code [id number]  Liliane Shi, PA-C ED   12/13/2016 03:45 12/14/2016 16:47 Full Code [id number]  Reubin Milan, MD Inpatient   09/13/2016 11:49 09/19/2016 14:58 Full Code [id number]  Flossie Dibble, MD ED   09/04/2016 01:37 09/05/2016 18:01 Full Code [id number]  Etta Quill, DO ED   08/21/2016 18:46 08/22/2016 17:33 Full Code [id number]  Thurnell Lose, MD Inpatient   03/18/2015 09:39 03/19/2015 18:02 Full Code [id number]  Dellia Nims, MD Inpatient   02/15/2015 17:00 02/18/2015 18:17 Full Code [id number]  Francesca Oman, DO Inpatient   10/02/2014 15:24 10/04/2014 16:49 Full Code [id number]  Samella Parr, NP Inpatient   03/25/2012 05:31 04/03/2012  19:31 Full Code [id number]  Maudry Diego, RN Inpatient      Lab Results  Component Value Date   PLT 224 09/19/2017     Time Spent in minutes   25 minutes  Nicholes Mango M.D on 09/19/2017 at 11:10 AM  Between 7am to 6pm - Pager - [phone]  After 6pm go to [email or web address] - Proofreader  [employer]   Office  [phone]

## 2017-09-19 NOTE — Progress Notes (Signed)
[facility]  ROUNDING NOTE   Subjective:  Patient had good urine output of 2000 cc  Still feels short of breath with exertion but walked around the nursing station yesterday with help Serum creatinine has further improved to 2.62  Objective:  Vital signs in last 24 hours:  Temp:  [98.2 F (36.8 C)-98.4 F (36.9 C)] 98.4 F (36.9 C) (03/10 0341) Pulse Rate:  [81-90] 86 (03/10 0714) Resp:  [18-20] 20 (03/10 0714) BP: (139-171)/(48-72) 171/72 (03/10 0714) SpO2:  [95 %-100 %] 95 % (03/10 0840) Weight:  [140.2 kg (309 lb)] 140.2 kg (309 lb) (03/10 0341)  Weight change: 2.994 kg (6 lb 9.6 oz) Filed Weights   09/17/17 0355 09/18/17 0414 09/19/17 0341  Weight: (!) 143.8 kg (317 lb 1.6 oz) (!) 137.2 kg (302 lb 6.4 oz) (!) 140.2 kg (309 lb)    Intake/Output: I/O last 3 completed shifts: In: 840 [P.O.:840] Out: 3950 [Urine:3950]   Intake/Output this shift:  Total I/O In: 120 [P.O.:120] Out: 900 [Urine:900]  Physical Exam: General: No acute distress  Head: Normocephalic, atraumatic. Moist oral mucosal membranes  Eyes: Anicteric  Neck: Supple, trachea midline  Lungs:   Clear to auscultation, normal effort  Heart: S1S2 no rubs  Abdomen:  Soft, nontender, bowel sounds present  Extremities: 2+ peripheral edema.  Neurologic: Awake, alert, following commands          Basic Metabolic Panel: Recent Labs  Lab 09/14/17 2312 09/15/17 0618 09/16/17 0632 09/18/17 0440 09/19/17 0457  NA 135 134* 138 135 137  K 4.3 4.3 3.9 3.5 3.6  CL 106 106 107 104 104  CO2 16* 18* 18* 20* 21*  GLUCOSE 387* 393* 147* 324* 189*  BUN 90* 93* 104* 108* 105*  CREATININE 2.86* 2.83* 3.03* 2.82* 2.62*  CALCIUM 8.0* 8.0* 8.1* 8.4* 8.4*    Liver Function Tests: Recent Labs  Lab 09/14/17 0706  AST 32  ALT 52  ALKPHOS 291*  BILITOT 0.7  PROT 7.6  ALBUMIN 3.7   No results for input(s): LIPASE, AMYLASE in the last 168 hours. No results for input(s): AMMONIA in the last 168  hours.  CBC: Recent Labs  Lab 09/14/17 0710 09/14/17 2312 09/15/17 0618 09/16/17 0632 09/19/17 0457  WBC 10.7 7.4 7.6 8.5 5.9  NEUTROABS 8.3* 6.8* 6.4 5.1  --   HGB 9.1* 8.3* 7.9* 8.0* 8.3*  HCT 27.8* 25.6* 24.7* 25.0* 25.4*  MCV 85.4 85.6 85.2 85.7 83.8  PLT 272 207 208 233 224    Cardiac Enzymes: Recent Labs  Lab 09/14/17 0710 09/14/17 1104 09/14/17 1635 09/14/17 2312  TROPONINI <0.03 <0.03 <0.03 <0.03    BNP: Invalid input(s): POCBNP  CBG: Recent Labs  Lab 09/18/17 1633 09/18/17 2125 09/18/17 2300 09/19/17 0814 09/19/17 1156  GLUCAP 195* 244* 218* 157* 177*    Microbiology: Results for orders placed or performed during the hospital encounter of 09/14/17  MRSA PCR Screening     Status: None   Collection Time: 09/14/17  4:40 PM  Result Value Ref Range Status   MRSA by PCR NEGATIVE NEGATIVE Final    Comment:        The GeneXpert MRSA Assay (FDA approved for NASAL specimens only), is one component of a comprehensive MRSA colonization surveillance program. It is not intended to diagnose MRSA infection nor to guide or monitor treatment for MRSA infections. Performed at [facility], [place], [place] [address]   CULTURE, BLOOD (ROUTINE X 2) w Reflex to ID Panel  Status: None   Collection Time: 09/14/17 11:11 PM  Result Value Ref Range Status   Specimen Description BLOOD LT [facility]  Final   Special Requests   Final    BOTTLES DRAWN AEROBIC AND ANAEROBIC Blood Culture adequate volume   Culture   Final    NO GROWTH 5 DAYS Performed at [facility], [address] [place], [place] [address]    Report Status 09/19/2017 FINAL  Final  CULTURE, BLOOD (ROUTINE X 2) w Reflex to ID Panel     Status: None   Collection Time: 09/14/17 11:37 PM  Result Value Ref Range Status   Specimen Description BLOOD RT ARM  Final   Special Requests   Final    BOTTLES DRAWN AEROBIC AND ANAEROBIC Blood Culture results may not be optimal due  to an inadequate volume of blood received in culture bottles   Culture   Final    NO GROWTH 5 DAYS Performed at [facility], [address], [place], [place] [address]    Report Status 09/19/2017 FINAL  Final  Urine Culture     Status: None   Collection Time: 09/15/17  3:18 AM  Result Value Ref Range Status   Specimen Description   Final    URINE, RANDOM Performed at [facility], [address], [place], [place] [address]    Special Requests   Final    NONE Performed at [facility], [address], [place], [place] [address]    Culture   Final    NO GROWTH Performed at [facility], [place] [address], [place], [place] [address]    Report Status 09/16/2017 FINAL  Final    Coagulation Studies: No results for input(s): LABPROT, INR in the last 72 hours.  Urinalysis: No results for input(s): COLORURINE, LABSPEC, PHURINE, GLUCOSEU, HGBUR, BILIRUBINUR, KETONESUR, PROTEINUR, UROBILINOGEN, NITRITE, LEUKOCYTESUR in the last 72 hours.  Invalid input(s): APPERANCEUR    Imaging: No results found.   Medications:   . sodium chloride     . allopurinol  100 mg Oral Daily  . amLODipine  10 mg Oral Daily  . aspirin EC  81 mg Oral Daily  . atorvastatin  80 mg Oral q1800  . budesonide (PULMICORT) nebulizer solution  0.5 mg Nebulization BID  . carvedilol  12.5 mg Oral BID WC  . colchicine  0.6 mg Oral Daily  . docusate sodium  100 mg Oral Q12H  . ferrous sulfate  325 mg Oral BID WC  . furosemide  40 mg Oral BID  . heparin  5,000 Units Subcutaneous Q8H  . hydrALAZINE  75 mg Oral TID  . insulin aspart  0-15 Units Subcutaneous TID WC  . insulin aspart  0-5 Units Subcutaneous QHS  . insulin aspart  10 Units Subcutaneous TID WC  . insulin glargine  45 Units Subcutaneous BID  . ipratropium-albuterol  3 mL Nebulization Q6H  . isosorbide mononitrate  90 mg Oral Daily  . pantoprazole  40 mg Oral Daily  . pregabalin  100 mg Oral TID  . ticagrelor  90  mg Oral BID  . valACYclovir  500 mg Oral Daily  . [START ON 10/11/2017] Vitamin D (Ergocalciferol)  50,000 Units Oral Q30 days   sodium chloride, albuterol, alum & mag hydroxide-simeth, fluticasone, hydrALAZINE, hydrOXYzine, hydrOXYzine, ketotifen, nitroGLYCERIN, oxyCODONE-acetaminophen  Assessment/ Plan:  62 y.o. female  African-American female with chronic kidney disease stage III followed by Dr. Arty Baumgartner, [facility], hypertension, coronary artery disease, congestive heart  failure with diastolic dysfunction, diabetes mellitus type II, hepatitis C chronic, peptic ulcer disease, obstructive sleep apnea  1.  Acute renal failure on chronic kidney disease stage III baseline creatinine 1.81/GFR 35 (Aug 05, 2017) 2.  Peripheral edema. 3.  Hypertension, severe upon admission. 4.  Acute respiratory failure, now off of BiPAP.  Plan:  Patient had good response to IV Lasix infusion.  Serum creatinine today has improved slightly however her BUN remains critically elevated.  Patient is currently on room air.  Lower extremity edema appears to be improving slowly.  Continue Lasix at 40 mg orally twice a day. Continue PT for deconditioning.   LOS: 5 Carilyn Woolston 3/10/20191:08 PM

## 2017-09-19 NOTE — Plan of Care (Signed)
Patient is progressing towards goals. Patient has minimal dyspnea with exertion.

## 2017-09-19 NOTE — Progress Notes (Signed)
Progress Note  Patient Name: [name] Date of Encounter: 09/19/2017  Primary Cardiologist: Burt Knack Patient Profile     62 y.o. female with history of CAD with prior LAD intervention,, HFpEF, mild aortic stenosis, CKD stage IV (baseline ~ 2), anemia of chronic disease, DM, HTN, HLD, hepatitis C, morbid obesity, and OSAadmitted with shortness of breath, hypertensive crisis bilateral airspace opacities and hypoxemia Most recent LHC 10/18 patent stent no other significant obstruction. 3/19 echocardiogram EF 65-70 mild LVH and hyperdynamic LV function.  Moderate aortic stenosis with mean gradient of 20 Creatinine 2.8--3>>2.6  Subjective  Still sob but some better  No chest pain   Inpatient Medications    Scheduled Meds: . allopurinol  100 mg Oral Daily  . amLODipine  10 mg Oral Daily  . aspirin EC  81 mg Oral Daily  . atorvastatin  80 mg Oral q1800  . budesonide (PULMICORT) nebulizer solution  0.5 mg Nebulization BID  . carvedilol  12.5 mg Oral BID WC  . colchicine  0.6 mg Oral Daily  . docusate sodium  100 mg Oral Q12H  . ferrous sulfate  325 mg Oral BID WC  . furosemide  40 mg Oral BID  . heparin  5,000 Units Subcutaneous Q8H  . hydrALAZINE  75 mg Oral TID  . insulin aspart  0-15 Units Subcutaneous TID WC  . insulin aspart  0-5 Units Subcutaneous QHS  . insulin aspart  10 Units Subcutaneous TID WC  . insulin glargine  45 Units Subcutaneous BID  . ipratropium-albuterol  3 mL Nebulization Q6H  . isosorbide mononitrate  90 mg Oral Daily  . pantoprazole  40 mg Oral Daily  . pregabalin  100 mg Oral TID  . ticagrelor  90 mg Oral BID  . valACYclovir  500 mg Oral Daily  . [START ON 10/11/2017] Vitamin D (Ergocalciferol)  50,000 Units Oral Q30 days   Continuous Infusions: . sodium chloride     PRN Meds: sodium chloride, albuterol, alum & mag hydroxide-simeth, fluticasone, hydrALAZINE, hydrOXYzine, hydrOXYzine, ketotifen, nitroGLYCERIN, oxyCODONE-acetaminophen   Vital  Signs    Vitals:   09/18/17 2100 09/19/17 0341 09/19/17 0714 09/19/17 0840  BP:  (!) 141/59 (!) 171/72   Pulse:  81 86   Resp:  18 20   Temp:  98.4 F (36.9 C)    TempSrc:  Oral    SpO2: 95% 100%  95%  Weight:  (!) 309 lb (140.2 kg)    Height:        Intake/Output Summary (Last 24 hours) at 09/19/2017 1306 Last data filed at 09/19/2017 0943 Gross per 24 hour  Intake 600 ml  Output 1500 ml  Net -900 ml   Filed Weights   09/17/17 0355 09/18/17 0414 09/19/17 0341  Weight: (!) 317 lb 1.6 oz (143.8 kg) (!) 302 lb 6.4 oz (137.2 kg) (!) 309 lb (140.2 kg)    Telemetry    Personally reviewed  NSR  ECG    n/a - Personally Reviewed  Physical Exam    Well developed and Morbidly obese  in no acute distress  HENT normal Neck supple with JVP unable to discern  Clear Regular rate and rhythm, no murmurs or gallops Abd-soft with active BS No Clubbing cyanosis 2+edema Skin-warm and dry A & Oriented  Grossly normal sensory and motor function   Labs    Chemistry Recent Labs  Lab 09/14/17 0706  09/16/17 2947 09/18/17 0440 09/19/17 0457  NA  --    < >  138 135 137  K  --    < > 3.9 3.5 3.6  CL  --    < > 107 104 104  CO2  --    < > 18* 20* 21*  GLUCOSE  --    < > 147* 324* 189*  BUN  --    < > 104* 108* 105*  CREATININE  --    < > 3.03* 2.82* 2.62*  CALCIUM  --    < > 8.1* 8.4* 8.4*  PROT 7.6  --   --   --   --   ALBUMIN 3.7  --   --   --   --   AST 32  --   --   --   --   ALT 52  --   --   --   --   ALKPHOS 291*  --   --   --   --   BILITOT 0.7  --   --   --   --   GFRNONAA  --    < > 16* 17* 19*  GFRAA  --    < > 18* 20* 22*  ANIONGAP  --    < > 13 11 12    < > = values in this interval not displayed.     Hematology Recent Labs  Lab 09/15/17 0618 09/16/17 0632 09/19/17 0457  WBC 7.6 8.5 5.9  RBC 2.90* 2.91* 3.03*  HGB 7.9* 8.0* 8.3*  HCT 24.7* 25.0* 25.4*  MCV 85.2 85.7 83.8  MCH 27.2 27.4 27.4  MCHC 32.0 32.0 32.7  RDW 18.9* 18.7* 18.3*  PLT 208 233  224    Cardiac Enzymes Recent Labs  Lab 09/14/17 0710 09/14/17 1104 09/14/17 1635 09/14/17 2312  TROPONINI <0.03 <0.03 <0.03 <0.03   No results for input(s): TROPIPOC in the last 168 hours.   BNP Recent Labs  Lab 09/14/17 0710  BNP 195.0*     DDimer No results for input(s): DDIMER in the last 168 hours.   Radiology    US Renal  Result Date: 09/14/2017 IMPRESSION: Negative for hydronephrosis.  No acute abnormality. Increased cortical echogenicity bilaterally consistent with medical renal disease. Electronically Signed   By: Inge Rise M.D.   On: 09/14/2017 09:39   Dg Chest Port 1 View  Result Date: 09/15/2017 IMPRESSION: 1. Overall improved aeration since prior radiograph with mild residual ground-glass opacity bilaterally, likely resolving edema. 2. Cardiomegaly Electronically Signed   By: Donavan Foil M.D.   On: 09/15/2017 03:53   Dg Chest Port 1 View  Result Date: 09/14/2017 IMPRESSION: Diffuse bilateral airspace opacities could reflect edema/CHF or infection. Electronically Signed   By: Rolm Baptise M.D.   On: 09/14/2017 07:36    Cardiac Studies   LHC 04/2017: Coronary Findings   Diagnostic  Dominance: Left  Left Anterior Descending  Prox LAD to Mid LAD lesion 95% stenosed  The lesion was previously treated using a drug eluting stent between 6-12 months ago. Previously placed stent displays no restenosis.  Mid LAD lesion 40% stenosed  Mid LAD lesion.  Dist LAD lesion 10% stenosed  Dist LAD lesion.  First Diagonal Branch  Vessel is moderate in size.  First Septal Branch  Vessel is small in size.  Second Diagonal Branch  Vessel is small in size.  Ost 2nd Diag lesion 60% stenosed  Ost 2nd Diag lesion.  Second Septal Branch  Vessel is small in size.  Third Diagonal Branch  Vessel is small  in size.  Left Circumflex  Mid Cx lesion 20% stenosed  Mid Cx lesion.  First Obtuse Marginal Branch  Vessel is moderate in size.  Ost 1st Mrg to 1st Mrg lesion  60% stenosed  Ost 1st Mrg to 1st Mrg lesion.  Second Obtuse Marginal Branch  Vessel is moderate in size.  Ost 2nd Mrg to 2nd Mrg lesion 50% stenosed  Ost 2nd Mrg to 2nd Mrg lesion.  Third Obtuse Marginal Branch  Vessel is moderate in size.  Fourth Obtuse Marginal Branch  Vessel is small in size.  Left Posterior Descending Artery  Vessel is moderate in size.  Right Coronary Artery  Vessel is small.  Intervention   No interventions have been documented.  Left Heart   Left Ventricle LV end diastolic pressure is normal.  Coronary Diagrams   Diagnostic Diagram        Conclusion   Conclusions: 1. Widely patent mid LAD stent. 2. No significant change in mild to moderate LCx, OM, and diagonal disease compared with 09/2016. 3. Normal left ventricular filling pressure.  Recommendations: 1. Continue medical therapy and secondary prevention. I will restart isosorbide mononitrate at 90 mg daily with hope of weaning off NTG infusion later today. 2. Gentle hydration given CKD and normal LVEDP. Hold furosemide today and reassess volume status and renal function tomorrow before restarting diuretic therapy.    TTE 09/15/17: Study Conclusions  - Left ventricle: The cavity size was at the upper limits of   normal. Wall thickness was increased in a pattern of mild LVH.   Systolic function was vigorous. The estimated ejection fraction   was in the range of 65% to 70%. Wall motion was normal; there   were no regional wall motion abnormalities. Doppler parameters   are consistent with abnormal left ventricular relaxation (grade 1   diastolic dysfunction). Doppler parameters are consistent with   high ventricular filling pressure. - Aortic valve: Transvalvular velocity was increased. There was   moderate stenosis. There was trivial regurgitation. Peak velocity   (S): 308 cm/s. Mean gradient (S): 20 mm Hg. Valve area (VTI):   1.34 cm^2. Valve area (Vmax): 1.11 cm^2. - Ascending  aorta: The ascending aorta was mildly dilated. - Mitral valve: Calcified annulus. Mildly thickened leaflets . - Left atrium: The atrium was mildly dilated. - Right ventricle: The cavity size was normal. Systolic function   was normal. - Inferior vena cava: The vessel was dilated. The respirophasic   diameter changes were blunted (< 50%), consistent with elevated   central venous pressure. - Pericardium, extracardiac: A trivial pericardial effusion was   identified posterior to the heart.     Assessment & Plan    Acute on chronic diastolic heart failure  Coronary artery disease with prior stenting LAD 3/18  Hypertensive crisis with hypertensive heart disease  Acute on chronic renal insufficiency  Morbid obesity  Obstructive sleep apnea      Blood pressures are better.  Beta-blocker was increased yesterday.     think she could benefit from more hydralazine.    Will defer to renal as well as deferring diuresis to renal.    I would be more aggressive as she is still significantly volume overloaded and her creatinine is decreasing.  I wonder if she is 1 of those people will have improved renal perfusion as renal venous hypertension diminishes with diuresis.   For questions or updates, please contact [place] Please consult [email or web address] for contact info under Cardiology/STEMI.    Signed, Thurmond Butts  [facility] Pager: [phone] 09/19/2017, 1:06 PM

## 2017-09-19 NOTE — Plan of Care (Signed)
  Pain Managment: General experience of comfort will improve 09/19/2017 0033 - Progressing by Jeri Cos, RN

## 2017-09-20 LAB — BASIC METABOLIC PANEL
Anion gap: 14 (ref 5–15)
BUN: 103 mg/dL — ABNORMAL HIGH (ref 6–20)
CO2: 21 mmol/L — ABNORMAL LOW (ref 22–32)
Calcium: 8.3 mg/dL — ABNORMAL LOW (ref 8.9–10.3)
Chloride: 105 mmol/L (ref 101–111)
Creatinine, Ser: 2.5 mg/dL — ABNORMAL HIGH (ref 0.44–1.00)
GFR calc Af Amer: 23 mL/min — ABNORMAL LOW (ref >60)
GFR calc non Af Amer: 20 mL/min — ABNORMAL LOW (ref >60)
Glucose, Bld: 143 mg/dL — ABNORMAL HIGH (ref 65–99)
Potassium: 3.8 mmol/L (ref 3.5–5.1)
Sodium: 140 mmol/L (ref 135–145)

## 2017-09-20 LAB — GLUCOSE, CAPILLARY: Glucose-Capillary: 136 mg/dL — ABNORMAL HIGH (ref 65–99)

## 2017-09-20 MED ORDER — INSULIN ASPART 100 UNIT/ML FLEXPEN: 10.0000 [IU] | PEN_INJECTOR | Freq: Three times a day (TID) | SUBCUTANEOUS | 3 refills | Status: DC

## 2017-09-20 MED ORDER — HYDRALAZINE HCL 25 MG PO TABS: 75.0000 mg | ORAL_TABLET | Freq: Three times a day (TID) | ORAL | 0 refills | Status: DC

## 2017-09-20 MED ORDER — ALUM & MAG HYDROXIDE-SIMETH 200-200-20 MG/5ML PO SUSP: 30.0000 mL | Freq: Four times a day (QID) | ORAL | 0 refills | Status: DC | PRN

## 2017-09-20 MED ORDER — IPRATROPIUM-ALBUTEROL 0.5-2.5 (3) MG/3ML IN SOLN: 3.0000 mL | Freq: Two times a day (BID) | RESPIRATORY_TRACT | Status: DC

## 2017-09-20 MED ORDER — OXYCODONE-ACETAMINOPHEN 5-325 MG PO TABS: 1.0000 | ORAL_TABLET | Freq: Four times a day (QID) | ORAL | 0 refills | Status: DC | PRN

## 2017-09-20 MED ORDER — DOCUSATE SODIUM 100 MG PO CAPS: 100.0000 mg | ORAL_CAPSULE | Freq: Two times a day (BID) | ORAL | 0 refills | Status: DC | PRN

## 2017-09-20 MED ORDER — FUROSEMIDE 40 MG PO TABS: 40.0000 mg | ORAL_TABLET | Freq: Two times a day (BID) | ORAL | 0 refills | Status: DC

## 2017-09-20 MED ORDER — ISOSORBIDE MONONITRATE ER 30 MG PO TB24: 90.0000 mg | ORAL_TABLET | Freq: Every day | ORAL | 0 refills | Status: DC

## 2017-09-20 NOTE — Progress Notes (Signed)
Progress Note  Patient Name: [name] Date of Encounter: 09/20/2017  Primary Cardiologist: Dr. Burt Knack  Subjective   She feels significantly better today with improved shortness of breath.  She wants to go home.  Her weight is down 19 pounds since admission and she is -10.2 L.  Inpatient Medications    Scheduled Meds: . allopurinol  100 mg Oral Daily  . amLODipine  10 mg Oral Daily  . aspirin EC  81 mg Oral Daily  . atorvastatin  80 mg Oral q1800  . budesonide (PULMICORT) nebulizer solution  0.5 mg Nebulization BID  . carvedilol  12.5 mg Oral BID WC  . colchicine  0.6 mg Oral Daily  . docusate sodium  100 mg Oral Q12H  . ferrous sulfate  325 mg Oral BID WC  . furosemide  40 mg Oral BID  . heparin  5,000 Units Subcutaneous Q8H  . hydrALAZINE  75 mg Oral TID  . insulin aspart  0-15 Units Subcutaneous TID WC  . insulin aspart  0-5 Units Subcutaneous QHS  . insulin aspart  10 Units Subcutaneous TID WC  . insulin glargine  45 Units Subcutaneous BID  . ipratropium-albuterol  3 mL Nebulization BID  . isosorbide mononitrate  90 mg Oral Daily  . pantoprazole  40 mg Oral Daily  . pregabalin  100 mg Oral TID  . sodium chloride flush  3 mL Intravenous Q12H  . ticagrelor  90 mg Oral BID  . valACYclovir  500 mg Oral Daily  . [START ON 10/11/2017] Vitamin D (Ergocalciferol)  50,000 Units Oral Q30 days   Continuous Infusions: . sodium chloride     PRN Meds: sodium chloride, albuterol, alum & mag hydroxide-simeth, fluticasone, hydrALAZINE, hydrOXYzine, hydrOXYzine, ketotifen, nitroGLYCERIN, oxyCODONE-acetaminophen   Vital Signs    Vitals:   09/20/17 0209 09/20/17 0258 09/20/17 0744 09/20/17 0747  BP:  (!) 156/67  (!) 143/59  Pulse:  85  80  Resp:  20  20  Temp:  98.2 F (36.8 C)    TempSrc:  Oral    SpO2: 96% 100% 96% 100%  Weight:  (!) 301 lb 8 oz (136.8 kg)    Height:        Intake/Output Summary (Last 24 hours) at 09/20/2017 1102 Last data filed at 09/20/2017  1012 Gross per 24 hour  Intake 720 ml  Output 1300 ml  Net -580 ml   Filed Weights   09/18/17 0414 09/19/17 0341 09/20/17 0258  Weight: (!) 302 lb 6.4 oz (137.2 kg) (!) 309 lb (140.2 kg) (!) 301 lb 8 oz (136.8 kg)    Telemetry    NSR - Personally Reviewed  ECG    Not done - Personally Reviewed  Physical Exam   GEN: No acute distress.   Neck:  Not able to visualize jugular venous pressure. Cardiac: RRR, no murmurs, rubs, or gallops.  Respiratory: Clear to auscultation bilaterally. GI: Soft, nontender, non-distended  MS: No deformity.  Mild bilateral leg edema. Neuro:  Nonfocal  Psych: Normal affect   Labs    Chemistry Recent Labs  Lab 09/14/17 0706  09/18/17 0440 09/19/17 0457 09/20/17 0544  NA  --    < > 135 137 140  K  --    < > 3.5 3.6 3.8  CL  --    < > 104 104 105  CO2  --    < > 20* 21* 21*  GLUCOSE  --    < > 324* 189* 143*  BUN  --    < > 108* 105* 103*  CREATININE  --    < > 2.82* 2.62* 2.50*  CALCIUM  --    < > 8.4* 8.4* 8.3*  PROT 7.6  --   --   --   --   ALBUMIN 3.7  --   --   --   --   AST 32  --   --   --   --   ALT 52  --   --   --   --   ALKPHOS 291*  --   --   --   --   BILITOT 0.7  --   --   --   --   GFRNONAA  --    < > 17* 19* 20*  GFRAA  --    < > 20* 22* 23*  ANIONGAP  --    < > 11 12 14    < > = values in this interval not displayed.     Hematology Recent Labs  Lab 09/15/17 0618 09/16/17 0632 09/19/17 0457  WBC 7.6 8.5 5.9  RBC 2.90* 2.91* 3.03*  HGB 7.9* 8.0* 8.3*  HCT 24.7* 25.0* 25.4*  MCV 85.2 85.7 83.8  MCH 27.2 27.4 27.4  MCHC 32.0 32.0 32.7  RDW 18.9* 18.7* 18.3*  PLT 208 233 224    Cardiac Enzymes Recent Labs  Lab 09/14/17 0710 09/14/17 1104 09/14/17 1635 09/14/17 2312  TROPONINI <0.03 <0.03 <0.03 <0.03   No results for input(s): TROPIPOC in the last 168 hours.   BNP Recent Labs  Lab 09/14/17 0710  BNP 195.0*     DDimer No results for input(s): DDIMER in the last 168 hours.   Radiology    No  results found.  Cardiac Studies   Echocardiogram EF 65-70 mild LVH and hyperdynamic LV function.  Moderate aortic stenosis with mean gradient of 20   Patient Profile     62 y.o. female with history of CAD with prior LAD intervention,, HFpEF, mild aortic stenosis, CKD stage IV (baseline ~ 2), anemia of chronic disease, DM, HTN, HLD, hepatitis C, morbid obesity, and OSAadmitted with shortness of breath, hypertensive crisis bilateral airspace opacities and hypoxemia Most recent LHC 10/18 patent stent no other significant obstruction.     Assessment & Plan     1.  Acute on chronic diastolic heart failure: Significant improvement in symptoms and the patient appears to be euvolemic.  She was taking furosemide 40 mg every other day as an outpatient.  I agree with current dosing of 40 mg twice daily. Continue treatment with the carvedilol, hydralazine and Imdur.  2.  Coronary artery disease involving native coronary arteries without angina: Continue medical therapy and dual antiplatelet therapy with aspirin and Brilinta.  3.  Essential hypertension: Blood pressure improved with the addition of carvedilol.  4.  Morbid obesity with obstructive sleep apnea.   The patient seems to be ready for hospital discharge today.  I will arrange for follow-up in our office within 2 weeks.    For questions or updates, please contact [place] Please consult [email or web address] for contact info under Cardiology/STEMI.      Signed, Kathlyn [place], MD  09/20/2017, 11:02 AM

## 2017-09-20 NOTE — Progress Notes (Signed)
A & O. Ambulating in the room. NSR. Room air. Pt reports no pain. IV and tele removed. Discharge instructions given to pt. Prescriptions given to pt. Pt has no further concerns at this time.

## 2017-09-20 NOTE — Discharge Summary (Signed)
[facility] NAME: [name]    MR#:  [id number]  DATE OF BIRTH:  12/25/1955  DATE OF ADMISSION:  09/14/2017 ADMITTING PHYSICIAN: Max Sane, MD  DATE OF DISCHARGE: 09/20/17   PRIMARY CARE PHYSICIAN: [name], PA-C    ADMISSION DIAGNOSIS:  Acute pulmonary edema ([place]) [J81.0] Acute respiratory failure with hypoxia (HCC) [J96.01] Hypertension, unspecified type [I10]  DISCHARGE DIAGNOSIS:  Active Problems:   Hypertensive emergency   Acute on chronic diastolic heart failure ([place])   Acute respiratory failure ([place])   SECONDARY DIAGNOSIS:   Past Medical History:  Diagnosis Date  . Aortic stenosis    Echo 8/18: mean 13, peak 28, LVOT/AV mean velocity 0.51  . Asthma    As a child   . Bronchitis   . CAD (coronary artery disease)    a. 09/2016: 50% Ost 1st Mrg stenosis, 50% 2nd Mrg stenosis, 20% Mid-Cx, 95% Prox LAD, 40% mid-LAD, and 10% dist-LAD stenosis. Staged PCI with DES to Prox-LAD.   [name] Chronic combined systolic and diastolic CHF (congestive heart failure) ([place]) 2011   echo 2/18: EF 55-60, normal wall motion, grade 2 diastolic dysfunction, trivial AI // echo 3/18: Septal and apical HK, EF 45-50, normal wall motion, trivial AI, mild LAE, PASP 38 // echo 8/18: EF 60-65, normal wall motion, grade 1 diastolic dysfunction, calcified aortic valve leaflets, mild aortic stenosis (mean 13, peak 28, LVOT/AV mean velocity 0.51), mild AI, moderate MAC, mild LAE, trivial TR   . Complication of anesthesia   . Diabetes mellitus Dx 1989  . Hepatitis C Dx 2013  . Hypertension Dx 1989  . Obesity   . Pancreatitis 2013  . Refusal of blood transfusions as patient is Jehovah's Witness   . Tendinitis   . Ulcer 2010    HOSPITAL COURSE:   HPI  [name]  is a 62 y.o. female with a known history of aortic stenosis, asthma, bronchitis, CHF, diabetes isbeing admitted for acute hypoxic resp failure. She is feeling shortness of  breath for at least the past 2 days. Patient states she has had a cough but cannot actually cough anything up. EMS arrived to find her hypoxic and she was placed on CPAP. Rales were noted throughout her lung fields by EMS. She was also noted to be very edematous diffusely. She is currently being treated with antibiotics for a wound on her left upper extremity which also has a wound VAC. She arrived on CPAP with oxygen saturations of 85%. Her SBP in 200s. In the ED, she was placed on BIPAP and started her on nitroglycerin via IV infusion with improvement in her blood pressure. She was also given IV Lasix but has not diuresed well. EDP has talked with Nephrology who has requested renal US and they will see her later.   *Acutehypoxicrespiratory failurewith Acute on chronic diastolic Congestive heart failure Clinically much better -[facility]iology[facility] - Lasix drip to p.o. Lasix twice a day from 09/18/2017.  Patient clinically doing much better.  Plan is to discharge patient home with Lasix 40 mg p.o. twice daily and outpatient follow-up with Dr. Burt Knack in 1-2 weeks -Echocardiogram with ejection fraction 65-70% - Daily weights and strict Is and Os  *Acute kidney injury over CKD stage III On Lasix p.o. twice daily.  Discussed with Dr. Candiss Norse.  No dialysis for now as patient is clinically improving.  Baseline creatinine is 1.8 -Creatinine 3.03 -2.82--2.62 --2.5 today  *CAD: -ASA, Coreg, Imdur ,  Lipitorand Brilinta -[facility] Cardio followed   * MalignantHypertension - BP improved -off Nitroglycerin drip -ContinueAmlodipine, Hydralazine, Imdur.  Increased the dose of Coreg - Not on beta blocker currently given respiratory status.  *Generalized weakness Physical therapy is recommending home health PT  Plan is to discharge patient home    DISCHARGE CONDITIONS:   stable  CONSULTS OBTAINED:  Treatment Team:  Nelva Bush, MD Anthonette Legato, MD   PROCEDURES   None   DRUG ALLERGIES:   Allergies  Allergen Reactions  . Shellfish Allergy Anaphylaxis and Swelling  . Diazepam Other (See Comments)    "felt like out of body experience"  . Morphine And Related Itching    DISCHARGE MEDICATIONS:   Allergies as of 09/20/2017      Reactions   Shellfish Allergy Anaphylaxis, Swelling   Diazepam Other (See Comments)   "felt like out of body experience"   Morphine And Related Itching      Medication List    STOP taking these medications   amoxicillin-clavulanate 875-125 MG tablet Commonly known as:  AUGMENTIN     TAKE these medications   ACCU-CHEK AVIVA device Use as instructed daily.   accu-chek softclix lancets Use as instructed daily.   albuterol (2.5 MG/3ML) 0.083% nebulizer solution Commonly known as:  PROVENTIL Take 3 mLs (2.5 mg total) by nebulization every 6 (six) hours as needed for wheezing or shortness of breath.   albuterol 108 (90 Base) MCG/ACT inhaler Commonly known as:  VENTOLIN HFA Inhale 2 puffs into the lungs every 4 (four) hours as needed for wheezing or shortness of breath.   allopurinol 100 MG tablet Commonly known as:  ZYLOPRIM Take 1 tablet (100 mg total) by mouth daily.   alum & mag hydroxide-simeth 200-200-20 MG/5ML suspension Commonly known as:  MAALOX/MYLANTA Take 30 mLs by mouth every 6 (six) hours as needed for indigestion or heartburn.   amLODipine 10 MG tablet Commonly known as:  NORVASC Take 1 tablet (10 mg total) by mouth daily.   aspirin 81 MG EC tablet Take 1 tablet (81 mg total) by mouth daily.   atorvastatin 80 MG tablet Commonly known as:  LIPITOR Take 1 tablet (80 mg total) by mouth daily at 6 PM.   carvedilol 25 MG tablet Commonly known as:  COREG TAKE 1 TABLET BY MOUTH 2 TIMES DAILY WITH A MEAL.   docusate sodium 100 MG capsule Commonly known as:  COLACE Take 1 capsule (100 mg total) by mouth 2 (two) times daily as needed for mild constipation. What changed:    when to take  this  reasons to take this   Exenatide ER 2 MG/0.85ML Auij Commonly known as:  BYDUREON BCISE Inject 2 mg into the skin once a week.   ferrous sulfate 325 (65 FE) MG tablet Take 1 tablet (325 mg total) by mouth 2 (two) times daily with a meal.   fluticasone 50 MCG/ACT nasal spray Commonly known as:  FLONASE Place 2 sprays into both nostrils daily as needed for allergies or rhinitis.   Fluticasone-Salmeterol 250-50 MCG/DOSE Aepb Commonly known as:  ADVAIR DISKUS Inhale 1 puff into the lungs 2 (two) times daily.   furosemide 40 MG tablet Commonly known as:  LASIX Take 1 tablet (40 mg total) by mouth 2 (two) times daily. What changed:  when to take this   glucose blood test strip Commonly known as:  TRUE METRIX BLOOD GLUCOSE TEST Use as instructed   glucose blood test strip Commonly known as:  ACCU-CHEK AVIVA  Use as instructed daily   hydrALAZINE 25 MG tablet Commonly known as:  APRESOLINE Take 3 tablets (75 mg total) by mouth 3 (three) times daily. What changed:    medication strength  how much to take   hydrOXYzine 50 MG tablet Commonly known as:  ATARAX/VISTARIL Take 1 tablet (50 mg total) by mouth 3 (three) times daily as needed for itching.   insulin aspart 100 UNIT/ML FlexPen Commonly known as:  NOVOLOG Inject 10 Units into the skin 3 (three) times daily with meals. What changed:  how much to take   Insulin Glargine 100 UNIT/ML Solostar Pen Commonly known as:  LANTUS SOLOSTAR Inject 48 Units into the skin 2 (two) times daily.   isosorbide mononitrate 30 MG 24 hr tablet Commonly known as:  IMDUR Take 3 tablets (90 mg total) by mouth daily. Start taking on:  09/21/2017 What changed:    medication strength  how much to take   ketotifen 0.025 % ophthalmic solution Commonly known as:  ZADITOR Place 1 drop into both eyes daily as needed (for dry eyes).   MITIGARE 0.6 MG Caps Generic drug:  Colchicine Take 1 capsule by mouth daily.   nitroGLYCERIN  0.4 MG SL tablet Commonly known as:  NITROSTAT Place 1 tablet (0.4 mg total) under the tongue every 5 (five) minutes x 3 doses as needed for chest pain.   omeprazole 20 MG capsule Commonly known as:  PRILOSEC Take 1 capsule (20 mg total) by mouth daily.   oxyCODONE-acetaminophen 5-325 MG tablet Commonly known as:  PERCOCET/ROXICET Take 1 tablet by mouth every 6 (six) hours as needed for severe pain.   pregabalin 100 MG capsule Commonly known as:  LYRICA Take 1 capsule (100 mg total) by mouth 3 (three) times daily.   ticagrelor 90 MG Tabs tablet Commonly known as:  BRILINTA Take 1 tablet (90 mg total) by mouth 2 (two) times daily.   valACYclovir 500 MG tablet Commonly known as:  VALTREX Take 1 tablet (500 mg total) by mouth daily.   Vitamin D (Ergocalciferol) 50000 units Caps capsule Commonly known as:  DRISDOL Take 1 capsule (50,000 Units total) by mouth every 30 (thirty) days.        DISCHARGE INSTRUCTIONS:   Follow-up with primary care physician in a week  follow-up withNephrology  on 09/30/2017 Follow-up with CHF clinic on 09/21/2017 Follow-up with Dr. Burt Knack in 2 weeks or sooner as needed  DIET:  Cardiac diet and Diabetic diet  DISCHARGE CONDITION:  Stable  ACTIVITY:  Activity as tolerated  OXYGEN:  Home Oxygen: No.   Oxygen Delivery: room air  DISCHARGE LOCATION:  home   If you experience worsening of your admission symptoms, develop shortness of breath, life threatening emergency, suicidal or homicidal thoughts you must seek medical attention immediately by calling 911 or calling your MD immediately  if symptoms less severe.  You Must read complete instructions/literature along with all the possible adverse reactions/side effects for all the Medicines you take and that have been prescribed to you. Take any new Medicines after you have completely understood and accpet all the possible adverse reactions/side effects.   Please note  You were cared for by  a hospitalist during your hospital stay. If you have any questions about your discharge medications or the care you received while you were in the hospital after you are discharged, you can call the unit and asked to speak with the hospitalist on call if the hospitalist that took care of you is not  available. Once you are discharged, your primary care physician will handle any further medical issues. Please note that NO REFILLS for any discharge medications will be authorized once you are discharged, as it is imperative that you return to your primary care physician (or establish a relationship with a primary care physician if you do not have one) for your aftercare needs so that they can reassess your need for medications and monitor your lab values.     Today  Chief Complaint  Patient presents with  . Respiratory Distress    Patient is doing fine.  Denies any shortness of breath or chest pain.  Wants to go home. ROS:  CONSTITUTIONAL: Denies fevers, chills. Denies any fatigue, weakness.  EYES: Denies blurry vision, double vision, eye pain. EARS, NOSE, THROAT: Denies tinnitus, ear pain, hearing loss. RESPIRATORY: Denies cough, wheeze, shortness of breath.  CARDIOVASCULAR: Denies chest pain, palpitations, edema.  GASTROINTESTINAL: Denies nausea, vomiting, diarrhea, abdominal pain. Denies bright red blood per rectum. GENITOURINARY: Denies dysuria, hematuria. ENDOCRINE: Denies nocturia or thyroid problems. HEMATOLOGIC AND LYMPHATIC: Denies easy bruising or bleeding. SKIN: Denies rash or lesion. MUSCULOSKELETAL: Denies pain in neck, back, shoulder, knees, hips or arthritic symptoms.  NEUROLOGIC: Denies paralysis, paresthesias.  PSYCHIATRIC: Denies anxiety or depressive symptoms.   VITAL SIGNS:  Blood pressure (!) 143/59, pulse 80, temperature 98.2 F (36.8 C), temperature source Oral, resp. rate 20, height _0  (1.727 m), weight (!) 136.8 kg (301 lb 8 oz), SpO2 100 %.  I/O:     Intake/Output Summary (Last 24 hours) at 09/20/2017 1114 Last data filed at 09/20/2017 1012 Gross per 24 hour  Intake 720 ml  Output 1300 ml  Net -580 ml    PHYSICAL EXAMINATION:  GENERAL:  62 y.o.-year-old patient lying in the bed with no acute distress.  EYES: Pupils equal, round, reactive to light and accommodation. No scleral icterus. Extraocular muscles intact.  HEENT: Head atraumatic, normocephalic. Oropharynx and nasopharynx clear.  NECK:  Supple, no jugular venous distention. No thyroid enlargement, no tenderness.  LUNGS: Normal breath sounds bilaterally, no wheezing, rales,rhonchi or crepitation. No use of accessory muscles of respiration.  CARDIOVASCULAR: S1, S2 normal. No murmurs, rubs, or gallops.  ABDOMEN: Soft, non-tender, non-distended. Bowel sounds present. No organomegaly or mass.  EXTREMITIES: No pedal edema, cyanosis, or clubbing.  NEUROLOGIC: Cranial nerves II through XII are intact. Muscle strength 5/5 in all extremities. Sensation intact. Gait not checked.  PSYCHIATRIC: The patient is alert and oriented x 3.  SKIN: No obvious rash, lesion, or ulcer.   DATA REVIEW:   CBC Recent Labs  Lab 09/19/17 0457  WBC 5.9  HGB 8.3*  HCT 25.4*  PLT 224    Chemistries  Recent Labs  Lab 09/14/17 0706  09/20/17 0544  NA  --    < > 140  K  --    < > 3.8  CL  --    < > 105  CO2  --    < > 21*  GLUCOSE  --    < > 143*  BUN  --    < > 103*  CREATININE  --    < > 2.50*  CALCIUM  --    < > 8.3*  AST 32  --   --   ALT 52  --   --   ALKPHOS 291*  --   --   BILITOT 0.7  --   --    < > = values in this interval not displayed.  Cardiac Enzymes Recent Labs  Lab 09/14/17 2312  TROPONINI <0.03    Microbiology Results  Results for orders placed or performed during the hospital encounter of 09/14/17  MRSA PCR Screening     Status: None   Collection Time: 09/14/17  4:40 PM  Result Value Ref Range Status   MRSA by PCR NEGATIVE NEGATIVE Final    Comment:         The GeneXpert MRSA Assay (FDA approved for NASAL specimens only), is one component of a comprehensive MRSA colonization surveillance program. It is not intended to diagnose MRSA infection nor to guide or monitor treatment for MRSA infections. Performed at [facility], [address] [place], [place] [address]   CULTURE, BLOOD (ROUTINE X 2) w Reflex to ID Panel     Status: None   Collection Time: 09/14/17 11:11 PM  Result Value Ref Range Status   Specimen Description BLOOD LT [facility]  Final   Special Requests   Final    BOTTLES DRAWN AEROBIC AND ANAEROBIC Blood Culture adequate volume   Culture   Final    NO GROWTH 5 DAYS Performed at [facility], [address] [place], [place] [address]    Report Status 09/19/2017 FINAL  Final  CULTURE, BLOOD (ROUTINE X 2) w Reflex to ID Panel     Status: None   Collection Time: 09/14/17 11:37 PM  Result Value Ref Range Status   Specimen Description BLOOD RT ARM  Final   Special Requests   Final    BOTTLES DRAWN AEROBIC AND ANAEROBIC Blood Culture results may not be optimal due to an inadequate volume of blood received in culture bottles   Culture   Final    NO GROWTH 5 DAYS Performed at [facility], [address] [place], [place] [address]    Report Status 09/19/2017 FINAL  Final  Urine Culture     Status: None   Collection Time: 09/15/17  3:18 AM  Result Value Ref Range Status   Specimen Description   Final    URINE, RANDOM Performed at [facility], [address], [place], [place] [address]    Special Requests   Final    NONE Performed at [facility], [address] [place], [place] [address]    Culture   Final    NO GROWTH Performed at [facility], [place] [address], [place], [place] [address]    Report Status 09/16/2017 FINAL  Final    RADIOLOGY:  No results found.  EKG:   Orders placed or performed during the hospital encounter of 09/14/17  . ED EKG  . ED  EKG      Management plans discussed with the patient, family and they are in agreement.  CODE STATUS:     Code Status Orders  (From admission, onward)        Start     Ordered   09/14/17 1311  Full code  Continuous     09/14/17 1310    Code Status History    Date Active Date Inactive Code Status Order ID Comments User Context   08/15/2017 23:06 08/19/2017 20:39 Full Code [id number]  Robert Bellow, MD Inpatient   08/11/2017 13:50 08/15/2017 23:06 Full Code [id number]  Demetrios Loll, MD Inpatient   06/02/2017 02:34 06/06/2017 15:36 Full Code [id number]  Caren Griffins, MD Inpatient   04/28/2017 05:16 05/02/2017 16:24 Full Code [id number]  Rise Patience, MD ED   03/17/2017 12:22 03/19/2017  20:30 Full Code [id number]  Sharmon Revere ED   12/13/2016 03:45 12/14/2016 16:47 Full Code [id number]  Reubin Milan, MD Inpatient   09/13/2016 11:49 09/19/2016 14:58 Full Code [id number]  Flossie Dibble, MD ED   09/04/2016 01:37 09/05/2016 18:01 Full Code [id number]  Etta Quill, DO ED   08/21/2016 18:46 08/22/2016 17:33 Full Code [id number]  Thurnell Lose, MD Inpatient   03/18/2015 09:39 03/19/2015 18:02 Full Code [id number]  Dellia Nims, MD Inpatient   02/15/2015 17:00 02/18/2015 18:17 Full Code [id number]  Francesca Oman, DO Inpatient   10/02/2014 15:24 10/04/2014 16:49 Full Code [id number]  Samella Parr, NP Inpatient   03/25/2012 05:31 04/03/2012 19:31 Full Code [id number]  Maudry Diego, RN Inpatient      TOTAL TIME TAKING CARE OF THIS PATIENT: 43  minutes.   Note: This dictation was prepared with Dragon dictation along with smaller phrase technology. Any transcriptional errors that result from this process are unintentional.   _0 @  on 09/20/2017 at 11:14 AM  Between 7am to 6pm - Pager - [phone]  After 6pm go to [email or web address] - password [email or web address] [facility]  Office  [phone]  CC: Primary care physician; [name], PA-C

## 2017-09-20 NOTE — Progress Notes (Signed)
[facility] received an order requisition to consult with patient. Patient is being discharged. CH provided support and an attentive ear.

## 2017-09-20 NOTE — Discharge Instructions (Signed)
Follow-up with primary care physician in a week  follow-up withNephrology  on 09/30/2017 Follow-up with CHF clinic on 09/21/2017 Follow-up with Dr. Burt Knack in 2 weeks or sooner as needed

## 2017-09-21 ENCOUNTER — Ambulatory Visit: Payer: Medicaid Other | Admitting: Family

## 2017-09-21 ENCOUNTER — Telehealth: Payer: Self-pay | Admitting: Family

## 2017-09-21 ENCOUNTER — Telehealth: Payer: Self-pay

## 2017-09-21 NOTE — Telephone Encounter (Signed)
**Note De-identified  Obfuscation** -----  **Note De-Identified  Obfuscation** Message from [place], [place] sent at 09/20/2017 11:55 AM EDT ----- Regarding: FW: f/u   ----- Message ----- From: Wellington Hampshire, MD Sent: 09/20/2017  11:12 AM To: [facility] Triage Subject: f/u                                            This is a patient of Dr. Burt Knack who is being discharged today from [facility] after presenting with heart failure. TCM follow-up needed with an APP within 2 weeks.

## 2017-09-21 NOTE — Telephone Encounter (Signed)
LMTCB.  The pt has an appointment scheduled with Dr Burt Knack on 10/04/17 at 9 am.

## 2017-09-21 NOTE — Telephone Encounter (Signed)
Pt called and scheduled hospital F/U for 09/29/17 @ 8:20 am with [name]. I didn't see the message that [place] had tried to contact pt until after pt hung up. Please advise. Thanks TNP

## 2017-09-21 NOTE — Telephone Encounter (Signed)
**Note De-Identified  Obfuscation** Patient contacted regarding discharge from [facility] on 09/20/17.  Patient understands to follow up with provider Dr Burt Knack on 10/04/17 at 9:00 at [facility] in [place]. Patient understands discharge instructions? Yes Patient understands medications and regiment? Yes Patient understands to bring all medications to this visit? Yes  The pt states that she is doing "ok" and is without any complaints at this time. She does have [place] phone number to call if she has any questions or concerns.

## 2017-09-21 NOTE — Telephone Encounter (Signed)
Patient missed her initial appointment at the [facility] on 09/21/17. Will attempt to reschedule.

## 2017-09-22 NOTE — Telephone Encounter (Signed)
Transition Care Management Follow-Up Telephone Call   Date discharged and where: [facility] on 09/20/17.  How have you been since you were released from the hospital? Still weak but feels ok. Pt is still congested, wheezing and eyes are swollen. Currently blowing dried blood from her nose. No SOB, fever or n/v/d.   Any patient concerns? Has wet the bed twice since being home. Pt is doing this while sleeping. No increase in fluid intake.  Items Reviewed:   Meds: verified  Allergies: verified  Dietary Changes Reviewed: N/A  Functional Questionnaire:  Independent-I Dependent-D  ADLs:   Dressing- I    Eating- I   Maintaining continence- I   Transferring- I   Transportation- I   Meal Prep- I   Managing Meds- I  Confirmed importance and Date/Time of follow-up visits scheduled: 09/29/17 @ 8:40 AM.   Confirmed with patient if condition worsens to call PCP or go to the Emergency Dept. Patient was given office number and encouraged to call back with questions or concerns: YES

## 2017-09-23 ENCOUNTER — Telehealth: Payer: Self-pay

## 2017-09-23 NOTE — Telephone Encounter (Signed)
EMMI Follow-up: [name] said she just received a call from my number and she is on the Future Pts list.  I explained our new process post discharge and let her know she would receive one more automated call and to please respond to the prompts if she had any concerns at that time. She stated she did not have any concerns today.

## 2017-09-24 ENCOUNTER — Other Ambulatory Visit: Payer: Self-pay | Admitting: Physician Assistant

## 2017-09-24 DIAGNOSIS — E1165 Type 2 diabetes mellitus with hyperglycemia: Secondary | ICD-10-CM

## 2017-09-24 DIAGNOSIS — K219 Gastro-esophageal reflux disease without esophagitis: Secondary | ICD-10-CM

## 2017-09-24 DIAGNOSIS — I25110 Atherosclerotic heart disease of native coronary artery with unstable angina pectoris: Secondary | ICD-10-CM

## 2017-09-24 DIAGNOSIS — N183 Chronic kidney disease, stage 3 unspecified: Secondary | ICD-10-CM

## 2017-09-24 DIAGNOSIS — IMO0002 Reserved for concepts with insufficient information to code with codable children: Secondary | ICD-10-CM

## 2017-09-24 DIAGNOSIS — I10 Essential (primary) hypertension: Secondary | ICD-10-CM

## 2017-09-24 DIAGNOSIS — Z9109 Other allergy status, other than to drugs and biological substances: Secondary | ICD-10-CM

## 2017-09-24 DIAGNOSIS — J45909 Unspecified asthma, uncomplicated: Secondary | ICD-10-CM

## 2017-09-24 DIAGNOSIS — I5033 Acute on chronic diastolic (congestive) heart failure: Secondary | ICD-10-CM

## 2017-09-24 DIAGNOSIS — A6000 Herpesviral infection of urogenital system, unspecified: Secondary | ICD-10-CM

## 2017-09-24 DIAGNOSIS — E118 Type 2 diabetes mellitus with unspecified complications: Principal | ICD-10-CM

## 2017-09-24 DIAGNOSIS — D631 Anemia in chronic kidney disease: Secondary | ICD-10-CM

## 2017-09-24 MED ORDER — AMLODIPINE BESYLATE 10 MG PO TABS: 10.0000 mg | ORAL_TABLET | Freq: Every day | ORAL | 3 refills | Status: DC

## 2017-09-24 MED ORDER — FERROUS SULFATE 325 (65 FE) MG PO TABS: 325.0000 mg | ORAL_TABLET | Freq: Two times a day (BID) | ORAL | 3 refills | Status: DC

## 2017-09-24 MED ORDER — ATORVASTATIN CALCIUM 80 MG PO TABS: 80.0000 mg | ORAL_TABLET | Freq: Every day | ORAL | 3 refills | Status: DC

## 2017-09-24 MED ORDER — OMEPRAZOLE 20 MG PO CPDR: 20.0000 mg | DELAYED_RELEASE_CAPSULE | Freq: Every day | ORAL | 3 refills | Status: DC

## 2017-09-24 MED ORDER — HYDROXYZINE HCL 50 MG PO TABS: 50.0000 mg | ORAL_TABLET | Freq: Three times a day (TID) | ORAL | 3 refills | Status: DC | PRN

## 2017-09-24 NOTE — Telephone Encounter (Signed)
Please review. The allopurinol you just refilled on 09/01/17 with a 90 day supply. Thanks!

## 2017-09-24 NOTE — Telephone Encounter (Signed)
Patient is requesting a refill on the following medications  hydrOXYzine (ATARAX/VISTARIL) 50 MG tablet   ferrous sulfate 325 (65 FE) MG tablet   omeprazole (PRILOSEC) 20 MG capsule  amLODipine (NORVASC) 10 MG tablet   She uses [facility] on [address] and [address] in [place].

## 2017-09-24 NOTE — Telephone Encounter (Signed)
Pt also requesting refill Allopurinol 100 MG and Atrovastiatin 80 MG sent to [facility] in [place] at [address]

## 2017-09-27 ENCOUNTER — Telehealth: Payer: Self-pay | Admitting: Cardiovascular Disease

## 2017-09-27 ENCOUNTER — Telehealth: Payer: Self-pay | Admitting: *Deleted

## 2017-09-27 NOTE — Telephone Encounter (Signed)
Emmi indicates patient has some concerns. Unable to determine what they might be.  Left voicemail message with CM call back number at 10:00a

## 2017-09-27 NOTE — Telephone Encounter (Signed)
CSW attempted to contact patient to follow up on emmi call where she reported losing interest in things.  CSW left a message awaiting for call back from her.  Jones Broom. Norval Morton, MSW, [place]  09/27/2017 5:32 PM

## 2017-09-27 NOTE — Telephone Encounter (Signed)
Patient calling,  Pt c/o Shortness Of Breath: STAT if SOB developed within the last 24 hours or pt is noticeably SOB on the phone  1. Are you currently SOB (can you hear that pt is SOB on the phone)? No   2. How long have you been experiencing SOB? About a week   3. Are you SOB when sitting or when up moving around? Sitting   4. Are you currently experiencing any other symptoms? no

## 2017-09-27 NOTE — Telephone Encounter (Signed)
Error

## 2017-09-27 NOTE — Telephone Encounter (Signed)
Patient c/o constant SOB for about one week. She states her symptoms seem to be the worst in the morning. She is very congested as well. She wakes up in the morning and her eyes are puffy. She has a dry cough and her voice sounds scratchy over the phone.  Instructed the patient to see her PCP for evaluation. She has an appointment on Wednesday. Instructed her to call to see if they could see her earlier because she may have some sort of infection. Reiterated to her that they would call if they thought something is wrong with her heart. She was grateful for call and agrees with treatment plan.

## 2017-09-27 NOTE — Telephone Encounter (Signed)
Emmi call indicated patient had "other concerns." Patient returned CM call and stated she was having some shortness of breath.  She did not keep her appointment at the [facility] because "I did not know why I had to keep that one when I  just got out of the hospital the day before."   She has obtained all of her medications and taking them as ordered. She has an appointment with her physician on 3/20. Discussed contacting her physician to report her symptoms and patient stated she would do it right after this call is comleted.  CM also provided rationale for appointments at the heart failure clinic. Clinic will contact to rescheduled and patient says she will keep it.

## 2017-09-28 ENCOUNTER — Ambulatory Visit: Payer: Medicaid Other | Admitting: Physician Assistant

## 2017-09-28 ENCOUNTER — Encounter: Payer: Self-pay | Admitting: Physician Assistant

## 2017-09-28 VITALS — BP 140/70 | HR 76 | Temp 97.8°F | Resp 20 | Ht 68.0 in | Wt 313.0 lb

## 2017-09-28 DIAGNOSIS — I10 Essential (primary) hypertension: Secondary | ICD-10-CM | POA: Diagnosis not present

## 2017-09-28 DIAGNOSIS — J9601 Acute respiratory failure with hypoxia: Secondary | ICD-10-CM | POA: Diagnosis not present

## 2017-09-28 DIAGNOSIS — E1165 Type 2 diabetes mellitus with hyperglycemia: Secondary | ICD-10-CM

## 2017-09-28 DIAGNOSIS — J984 Other disorders of lung: Secondary | ICD-10-CM | POA: Diagnosis not present

## 2017-09-28 DIAGNOSIS — E118 Type 2 diabetes mellitus with unspecified complications: Secondary | ICD-10-CM

## 2017-09-28 DIAGNOSIS — IMO0001 Reserved for inherently not codable concepts without codable children: Secondary | ICD-10-CM

## 2017-09-28 DIAGNOSIS — J301 Allergic rhinitis due to pollen: Secondary | ICD-10-CM | POA: Diagnosis not present

## 2017-09-28 DIAGNOSIS — Z9189 Other specified personal risk factors, not elsewhere classified: Secondary | ICD-10-CM | POA: Diagnosis not present

## 2017-09-28 DIAGNOSIS — I5042 Chronic combined systolic (congestive) and diastolic (congestive) heart failure: Secondary | ICD-10-CM | POA: Diagnosis not present

## 2017-09-28 DIAGNOSIS — IMO0002 Reserved for concepts with insufficient information to code with codable children: Secondary | ICD-10-CM

## 2017-09-28 MED ORDER — MONTELUKAST SODIUM 10 MG PO TABS: 10.0000 mg | ORAL_TABLET | Freq: Every day | ORAL | 1 refills | Status: DC

## 2017-09-28 MED ORDER — DULAGLUTIDE 0.75 MG/0.5ML ~~LOC~~ SOAJ: 0.7500 mg | SUBCUTANEOUS | 3 refills | Status: DC

## 2017-09-28 MED ORDER — HYDRALAZINE HCL 100 MG PO TABS: 100.0000 mg | ORAL_TABLET | Freq: Two times a day (BID) | ORAL | 1 refills | Status: DC

## 2017-09-28 MED ORDER — LORATADINE 10 MG PO TABS: 10.0000 mg | ORAL_TABLET | Freq: Every day | ORAL | 1 refills | Status: DC

## 2017-09-28 MED ORDER — FLUTICASONE PROPIONATE 50 MCG/ACT NA SUSP: 2.0000 | Freq: Every day | NASAL | 1 refills | Status: AC | PRN

## 2017-09-28 NOTE — Progress Notes (Signed)
Patient: [name] Female    DOB: 1956/05/18   62 y.o.   MRN: [id number] Visit Date: 09/28/2017  Today's Provider: [name], PA-C   Chief Complaint  Patient presents with  . Hospitalization Follow-up   Subjective:    HPI   Follow up Hospitalization  Patient was admitted to [facility] on 09/14/17 and discharged on 09/20/17. She was treated for acute hypoxic resp failure. Treatment for this included Lasix, labs. Telephone follow up was done on 09/21/17 She reports excellent compliance with treatment. She reports this condition is Unchanged. ------------------------------------------------------------------------------------     Allergies  Allergen Reactions  . Shellfish Allergy Anaphylaxis and Swelling  . Diazepam Other (See Comments)    "felt like out of body experience"  . Morphine And Related Itching     Current Outpatient Medications:  .  albuterol (PROVENTIL) (2.5 MG/3ML) 0.083% nebulizer solution, Take 3 mLs (2.5 mg total) by nebulization every 6 (six) hours as needed for wheezing or shortness of breath., Disp: 150 mL, Rfl: 1 .  albuterol (VENTOLIN HFA) 108 (90 Base) MCG/ACT inhaler, Inhale 2 puffs into the lungs every 4 (four) hours as needed for wheezing or shortness of breath., Disp: 54 g, Rfl: 3 .  allopurinol (ZYLOPRIM) 100 MG tablet, Take 1 tablet (100 mg total) by mouth daily., Disp: 90 tablet, Rfl: 3 .  amLODipine (NORVASC) 10 MG tablet, Take 1 tablet (10 mg total) by mouth daily., Disp: 90 tablet, Rfl: 3 .  aspirin EC 81 MG EC tablet, Take 1 tablet (81 mg total) by mouth daily., Disp: , Rfl:  .  atorvastatin (LIPITOR) 80 MG tablet, Take 1 tablet (80 mg total) by mouth daily at 6 PM., Disp: 90 tablet, Rfl: 3 .  Blood Glucose Monitoring Suppl (ACCU-CHEK AVIVA) device, Use as instructed daily., Disp: 1 each, Rfl: 0 .  carvedilol (COREG) 25 MG tablet, TAKE 1 TABLET BY MOUTH 2 TIMES DAILY WITH A MEAL., Disp: 180 tablet, Rfl: 3 .  docusate sodium  (COLACE) 100 MG capsule, Take 1 capsule (100 mg total) by mouth 2 (two) times daily as needed for mild constipation., Disp: 30 capsule, Rfl: 0 .  ferrous sulfate 325 (65 FE) MG tablet, Take 1 tablet (325 mg total) by mouth 2 (two) times daily with a meal., Disp: 180 tablet, Rfl: 3 .  fluticasone (FLONASE) 50 MCG/ACT nasal spray, Place 2 sprays into both nostrils daily as needed for allergies or rhinitis., Disp: 16 g, Rfl: 5 .  Fluticasone-Salmeterol (ADVAIR DISKUS) 250-50 MCG/DOSE AEPB, Inhale 1 puff into the lungs 2 (two) times daily., Disp: 180 each, Rfl: 3 .  furosemide (LASIX) 40 MG tablet, Take 1 tablet (40 mg total) by mouth 2 (two) times daily., Disp: 60 tablet, Rfl: 0 .  glucose blood (ACCU-CHEK AVIVA) test strip, Use as instructed daily, Disp: 100 each, Rfl: 12 .  glucose blood (TRUE METRIX BLOOD GLUCOSE TEST) test strip, Use as instructed, Disp: 100 each, Rfl: 12 .  hydrALAZINE (APRESOLINE) 25 MG tablet, Take 3 tablets (75 mg total) by mouth 3 (three) times daily., Disp: 90 tablet, Rfl: 0 .  hydrOXYzine (ATARAX/VISTARIL) 50 MG tablet, Take 1 tablet (50 mg total) by mouth 3 (three) times daily as needed for itching., Disp: 90 tablet, Rfl: 3 .  insulin aspart (NOVOLOG) 100 UNIT/ML FlexPen, Inject 10 Units into the skin 3 (three) times daily with meals., Disp: 15 mL, Rfl: 3 .  Insulin Glargine (LANTUS SOLOSTAR) 100 UNIT/ML Solostar Pen, Inject 48  Units into the skin 2 (two) times daily., Disp: 24 mL, Rfl: 2 .  isosorbide mononitrate (IMDUR) 30 MG 24 hr tablet, Take 3 tablets (90 mg total) by mouth daily., Disp: 90 tablet, Rfl: 0 .  Lancet Devices (ACCU-CHEK SOFTCLIX) lancets, Use as instructed daily., Disp: 1 each, Rfl: 5 .  MITIGARE 0.6 MG CAPS, Take 1 capsule by mouth daily., Disp: 90 capsule, Rfl: 0 .  nitroGLYCERIN (NITROSTAT) 0.4 MG SL tablet, Place 1 tablet (0.4 mg total) under the tongue every 5 (five) minutes x 3 doses as needed for chest pain., Disp: 25 tablet, Rfl: 12 .  omeprazole  (PRILOSEC) 20 MG capsule, Take 1 capsule (20 mg total) by mouth daily., Disp: 90 capsule, Rfl: 3 .  oxyCODONE-acetaminophen (PERCOCET/ROXICET) 5-325 MG tablet, Take 1 tablet by mouth every 6 (six) hours as needed for severe pain., Disp: 20 tablet, Rfl: 0 .  pregabalin (LYRICA) 100 MG capsule, Take 1 capsule (100 mg total) by mouth 3 (three) times daily., Disp: 270 capsule, Rfl: 1 .  ticagrelor (BRILINTA) 90 MG TABS tablet, Take 1 tablet (90 mg total) by mouth 2 (two) times daily., Disp: 180 tablet, Rfl: 3 .  valACYclovir (VALTREX) 500 MG tablet, Take 1 tablet (500 mg total) by mouth daily., Disp: 90 tablet, Rfl: 3 .  Vitamin D, Ergocalciferol, (DRISDOL) 50000 units CAPS capsule, Take 1 capsule (50,000 Units total) by mouth every 30 (thirty) days., Disp: 4 capsule, Rfl: 1 .  Exenatide ER (BYDUREON BCISE) 2 MG/0.85ML AUIJ, Inject 2 mg into the skin once a week. (Patient not taking: Reported on 09/22/2017), Disp: 12 pen, Rfl: 3 .  ketotifen (ZADITOR) 0.025 % ophthalmic solution, Place 1 drop into both eyes daily as needed (for dry eyes). (Patient not taking: Reported on 09/28/2017), Disp: 10 mL, Rfl: 2  Review of Systems  Constitutional: Positive for fatigue.  Respiratory: Positive for cough, shortness of breath and wheezing.   Cardiovascular: Negative.   Gastrointestinal: Negative.   Neurological: Negative.     Social History   Tobacco Use  . Smoking status: Former Smoker    Last attempt to quit: 10/25/1980    Years since quitting: 36.9  . Smokeless tobacco: Never Used  . Tobacco comment: quit smoking in 1982  Substance Use Topics  . Alcohol use: Yes    Comment: 3 times in last year   Objective:   BP 140/70 (BP Location: Left Arm, Patient Position: Sitting, Cuff Size: Large)   Pulse 76   Temp 97.8 F (36.6 C) (Oral)   Resp 20   Ht 5\' 8"  (1.727 m)   Wt (!) 313 lb (142 kg)   SpO2 95%   BMI 47.59 kg/m  Vitals:   09/28/17 0936  BP: 140/70  Pulse: 76  Resp: 20  Temp: 97.8 F (36.6  C)  TempSrc: Oral  SpO2: 95%  Weight: (!) 313 lb (142 kg)  Height: 5\' 8"  (1.727 m)     Physical Exam  Constitutional: She appears well-developed and well-nourished. No distress.  Neck: Normal range of motion. Neck supple. No JVD present. No tracheal deviation present. No thyromegaly present.  Cardiovascular: Normal rate and regular rhythm. Exam reveals no gallop and no friction rub.  Murmur heard.  Systolic murmur is present with a grade of 3/6. Pulmonary/Chest: Effort normal and breath sounds normal. No respiratory distress. She has no wheezes. She has no rales.  Musculoskeletal: She exhibits edema (3+ pitting edema).  Lymphadenopathy:    She has no cervical adenopathy.  Skin:  She is not diaphoretic.  Vitals reviewed.       Assessment & Plan:     1. Transition of care performed with sharing of clinical summary All notes, labs and images from hospitalization all reviewed.   2. Chronic combined systolic and diastolic CHF (congestive heart failure) (HCC) Spirometry shows moderate restrictive disease. I feel this has been worsened with multiple CHF exacerbations on top of her baseline asthma. She has been using Advair 250-50 BID and albuterol prn. I feel she may require better optimization due to multiple medical issues and will refer her to Pulmonology. Also she has an appt tomorrow with Darylene Price in the heart failure clinic and an appt with Dr. Burt Knack on 10/04/17. She has not been responding well to furosemide so I wonder if she may respond to [facility] better. She does also have renal failure so she does not have much room for increasing doses on the diuretics. I will see her back in 4-6 weeks.  - Spirometry with Graph  3. Acute respiratory failure with hypoxia ([place]) See above medical treatment plan. - Spirometry with Graph - montelukast (SINGULAIR) 10 MG tablet; Take 1 tablet (10 mg total) by mouth at bedtime.  Dispense: 90 tablet; Refill: 1 - Ambulatory referral to  Pulmonology  4. Essential hypertension Will change hydralazine to 100mg  BID as below as patient reports she does not like taking 3 25mg  tabs TID.  - hydrALAZINE (APRESOLINE) 100 MG tablet; Take 1 tablet (100 mg total) by mouth 2 (two) times daily.  Dispense: 180 tablet; Refill: 1  5. Seasonal allergic rhinitis due to pollen Still awakening with puffy eyes and post nasal drainage. Suspect allergies. Patient not on any medications for allergies at this time. Flonase refilled, start loratadine and singulair.  - fluticasone (FLONASE) 50 MCG/ACT nasal spray; Place 2 sprays into both nostrils daily as needed for allergies or rhinitis.  Dispense: 48 g; Refill: 1 - montelukast (SINGULAIR) 10 MG tablet; Take 1 tablet (10 mg total) by mouth at bedtime.  Dispense: 90 tablet; Refill: 1 - loratadine (CLARITIN) 10 MG tablet; Take 1 tablet (10 mg total) by mouth daily.  Dispense: 90 tablet; Refill: 1  6. Diabetes mellitus type 2, uncontrolled, with complications ([place]) BCise on back order. Patient given sample of trulicity in the office today. Will switch therapy. Will recheck A1c in 4-6 weeks.  - Dulaglutide (TRULICITY) 3.88 IL/5.7VJ SOPN; Inject 0.75 mg into the skin once a week.  Dispense: 4 pen; Refill: 3  7. Chronic restrictive lung disease Noted on spirometry today. Note plan for #2.  - Ambulatory referral to [place], PA-C  [facility]

## 2017-09-29 ENCOUNTER — Ambulatory Visit: Payer: Medicaid Other | Admitting: Family

## 2017-09-29 ENCOUNTER — Inpatient Hospital Stay: Payer: Medicaid Other | Admitting: Physician Assistant

## 2017-09-30 ENCOUNTER — Encounter: Payer: Self-pay | Admitting: Family

## 2017-09-30 ENCOUNTER — Ambulatory Visit: Payer: Medicaid Other | Attending: Family | Admitting: Family

## 2017-09-30 VITALS — BP 119/56 | HR 73 | Resp 18 | Ht 68.0 in | Wt 312.5 lb

## 2017-09-30 DIAGNOSIS — Z7982 Long term (current) use of aspirin: Secondary | ICD-10-CM | POA: Insufficient documentation

## 2017-09-30 DIAGNOSIS — G4733 Obstructive sleep apnea (adult) (pediatric): Secondary | ICD-10-CM | POA: Diagnosis not present

## 2017-09-30 DIAGNOSIS — J45909 Unspecified asthma, uncomplicated: Secondary | ICD-10-CM | POA: Diagnosis not present

## 2017-09-30 DIAGNOSIS — I10 Essential (primary) hypertension: Secondary | ICD-10-CM

## 2017-09-30 DIAGNOSIS — I890 Lymphedema, not elsewhere classified: Secondary | ICD-10-CM | POA: Insufficient documentation

## 2017-09-30 DIAGNOSIS — I130 Hypertensive heart and chronic kidney disease with heart failure and stage 1 through stage 4 chronic kidney disease, or unspecified chronic kidney disease: Secondary | ICD-10-CM | POA: Diagnosis present

## 2017-09-30 DIAGNOSIS — I2510 Atherosclerotic heart disease of native coronary artery without angina pectoris: Secondary | ICD-10-CM | POA: Diagnosis not present

## 2017-09-30 DIAGNOSIS — E669 Obesity, unspecified: Secondary | ICD-10-CM | POA: Diagnosis not present

## 2017-09-30 DIAGNOSIS — E1165 Type 2 diabetes mellitus with hyperglycemia: Secondary | ICD-10-CM

## 2017-09-30 DIAGNOSIS — I5042 Chronic combined systolic (congestive) and diastolic (congestive) heart failure: Secondary | ICD-10-CM | POA: Diagnosis not present

## 2017-09-30 DIAGNOSIS — Z794 Long term (current) use of insulin: Secondary | ICD-10-CM | POA: Diagnosis not present

## 2017-09-30 DIAGNOSIS — IMO0002 Reserved for concepts with insufficient information to code with codable children: Secondary | ICD-10-CM

## 2017-09-30 DIAGNOSIS — I350 Nonrheumatic aortic (valve) stenosis: Secondary | ICD-10-CM | POA: Insufficient documentation

## 2017-09-30 DIAGNOSIS — Z6841 Body Mass Index (BMI) 40.0 and over, adult: Secondary | ICD-10-CM | POA: Diagnosis not present

## 2017-09-30 DIAGNOSIS — Z885 Allergy status to narcotic agent status: Secondary | ICD-10-CM | POA: Diagnosis not present

## 2017-09-30 DIAGNOSIS — Z955 Presence of coronary angioplasty implant and graft: Secondary | ICD-10-CM | POA: Diagnosis not present

## 2017-09-30 DIAGNOSIS — E1122 Type 2 diabetes mellitus with diabetic chronic kidney disease: Secondary | ICD-10-CM | POA: Insufficient documentation

## 2017-09-30 DIAGNOSIS — Z79899 Other long term (current) drug therapy: Secondary | ICD-10-CM | POA: Insufficient documentation

## 2017-09-30 DIAGNOSIS — N189 Chronic kidney disease, unspecified: Secondary | ICD-10-CM | POA: Diagnosis not present

## 2017-09-30 DIAGNOSIS — Z87891 Personal history of nicotine dependence: Secondary | ICD-10-CM | POA: Diagnosis not present

## 2017-09-30 DIAGNOSIS — E118 Type 2 diabetes mellitus with unspecified complications: Secondary | ICD-10-CM

## 2017-09-30 DIAGNOSIS — I5032 Chronic diastolic (congestive) heart failure: Secondary | ICD-10-CM | POA: Insufficient documentation

## 2017-09-30 LAB — GLUCOSE, CAPILLARY: Glucose-Capillary: 304 mg/dL — ABNORMAL HIGH (ref 65–99)

## 2017-09-30 NOTE — Progress Notes (Signed)
Patient ID: [name], female    DOB: 06/21/1956, 62 y.o.   MRN: [id number]  HPI  [name] is a 62 y/o female with a history of asthma, CAD, DM, HTN, CKD, hepatitis C, pancreatitis, obstructive sleep apnea, previous tobacco use and chronic heart failure.   Echo report from 09/15/17 reviewed and showed an EF of 65-70% along with moderate AS, trivial AR and calcified mitral valve annulus. Cardiac catheterization done 04/29/17 which showed a widely patient mid LAD stent and normal left ventricular filling pressure.   Admitted 09/14/17 due to acute on chronic HF. Initially needed lasix drip and then transitioned to oral lasix. Cardiology and pulmonology consults were obtained. Had acute kidney injury which did improve. Discussed with nephrology. Discharged after 6 days.   She presents today for her initial visit with a chief complaint of moderate fatigue with little exertion. She says this has been chronic in nature having been present for several years. She does feel like it's worsening recently. She has associated shortness of breath, edema, abdominal distention and difficulty sleeping along with this. She denies palpitations, chest pain or dizziness. She says that she doesn't feel like her lasix works as good as it used to.   Past Medical History:  Diagnosis Date  . Aortic stenosis    Echo 8/18: mean 13, peak 28, LVOT/AV mean velocity 0.51  . Asthma    As a child   . Bronchitis   . CAD (coronary artery disease)    a. 09/2016: 50% Ost 1st Mrg stenosis, 50% 2nd Mrg stenosis, 20% Mid-Cx, 95% Prox LAD, 40% mid-LAD, and 10% dist-LAD stenosis. Staged PCI with DES to Prox-LAD.   [name] Chronic combined systolic and diastolic CHF (congestive heart failure) ([place]) 2011   echo 2/18: EF 55-60, normal wall motion, grade 2 diastolic dysfunction, trivial AI // echo 3/18: Septal and apical HK, EF 45-50, normal wall motion, trivial AI, mild LAE, PASP 38 // echo 8/18: EF 60-65, normal wall motion, grade 1 diastolic  dysfunction, calcified aortic valve leaflets, mild aortic stenosis (mean 13, peak 28, LVOT/AV mean velocity 0.51), mild AI, moderate MAC, mild LAE, trivial TR   . Chronic kidney disease   . Complication of anesthesia   . Diabetes mellitus Dx 1989  . Hepatitis C Dx 2013  . Hypertension Dx 1989  . Obesity   . Obstructive sleep apnea   . Pancreatitis 2013  . Refusal of blood transfusions as patient is Jehovah's Witness   . Tendinitis   . Ulcer 2010   Past Surgical History:  Procedure Laterality Date  . APPLICATION OF WOUND VAC Left 08/14/2017   Procedure: APPLICATION OF WOUND VAC Exchange;  Surgeon: Robert Bellow, MD;  Location: [facility] ORS;  Service: General;  Laterality: Left;  . CHOLECYSTECTOMY    . CORONARY STENT INTERVENTION N/A 09/18/2016   Procedure: Coronary Stent Intervention;  Surgeon: Troy Sine, MD;  Location: [place] [facility];  Service: Cardiovascular;  Laterality: N/A;  . DRESSING CHANGE UNDER ANESTHESIA Left 08/15/2017   Procedure: exploration of wound for bleeding;  Surgeon: Robert Bellow, MD;  Location: [facility] ORS;  Service: General;  Laterality: Left;  . EYE SURGERY    . INCISION AND DRAINAGE ABSCESS Left 08/12/2017   Procedure: INCISION AND DRAINAGE ABSCESS;  Surgeon: Robert Bellow, MD;  Location: [facility] ORS;  Service: General;  Laterality: Left;  . KNEE ARTHROSCOPY    . LEFT HEART CATH N/A 09/18/2016   Procedure: Left Heart Cath;  Surgeon:  Troy Sine, MD;  Location: [place] [facility];  Service: Cardiovascular;  Laterality: N/A;  . LEFT HEART CATH AND CORONARY ANGIOGRAPHY N/A 09/16/2016   Procedure: Left Heart Cath and Coronary Angiography;  Surgeon: Burnell Blanks, MD;  Location: [place] [facility];  Service: Cardiovascular;  Laterality: N/A;  . LEFT HEART CATH AND CORONARY ANGIOGRAPHY N/A 04/29/2017   Procedure: LEFT HEART CATH AND CORONARY ANGIOGRAPHY;  Surgeon: Nelva Bush, MD;  Location: [place] [facility];  Service: Cardiovascular;   Laterality: N/A;  . TUBAL LIGATION    . TUBAL LIGATION     Family History  Problem Relation Age of Onset  . Colon cancer Mother   . Heart attack Other   . Heart attack Maternal Grandmother   . Hypertension Sister   . Hypertension Brother   . Diabetes Paternal Grandmother   . Breast cancer Neg Hx    Social History   Tobacco Use  . Smoking status: Former Smoker    Last attempt to quit: 10/25/1980    Years since quitting: 36.9  . Smokeless tobacco: Never Used  . Tobacco comment: quit smoking in 1982  Substance Use Topics  . Alcohol use: Yes    Comment: 3 times in last year   Allergies  Allergen Reactions  . Shellfish Allergy Anaphylaxis and Swelling  . Diazepam Other (See Comments)    "felt like out of body experience"  . Morphine And Related Itching   Prior to Admission medications   Medication Sig Start Date End Date Taking? Authorizing Provider  albuterol (PROVENTIL) (2.5 MG/3ML) 0.083% nebulizer solution Take 3 mLs (2.5 mg total) by nebulization every 6 (six) hours as needed for wheezing or shortness of breath. 09/01/17  Yes [name], PA-C  albuterol (VENTOLIN HFA) 108 (90 Base) MCG/ACT inhaler Inhale 2 puffs into the lungs every 4 (four) hours as needed for wheezing or shortness of breath. 09/01/17  Yes [name], PA-C  allopurinol (ZYLOPRIM) 100 MG tablet Take 1 tablet (100 mg total) by mouth daily. 09/01/17  Yes [name], PA-C  amLODipine (NORVASC) 10 MG tablet Take 1 tablet (10 mg total) by mouth daily. 09/24/17  Yes [name], PA-C  aspirin EC 81 MG EC tablet Take 1 tablet (81 mg total) by mouth daily. 09/19/16  Yes Strader, [place], PA-C  atorvastatin (LIPITOR) 80 MG tablet Take 1 tablet (80 mg total) by mouth daily at 6 PM. 09/24/17  Yes Burnette, Clearnce Sorrel, PA-C  Blood Glucose Monitoring Suppl (ACCU-CHEK AVIVA) device Use as instructed daily. 04/14/17  Yes Newlin, Charlane Ferretti, MD  carvedilol (COREG) 25 MG tablet TAKE 1 TABLET BY  MOUTH 2 TIMES DAILY WITH A MEAL. 02/12/17  Yes Funches, Josalyn, MD  docusate sodium (COLACE) 100 MG capsule Take 1 capsule (100 mg total) by mouth 2 (two) times daily as needed for mild constipation. 09/20/17  Yes Gouru, Aruna, MD  Dulaglutide (TRULICITY) 2.35 TI/1.4ER SOPN Inject 0.75 mg into the skin once a week. 09/28/17  Yes [name], PA-C  ferrous sulfate 325 (65 FE) MG tablet Take 1 tablet (325 mg total) by mouth 2 (two) times daily with a meal. 09/24/17  Yes Burnette, Anderson Malta M, PA-C  fluticasone (FLONASE) 50 MCG/ACT nasal spray Place 2 sprays into both nostrils daily as needed for allergies or rhinitis. 09/28/17  Yes [name], PA-C  Fluticasone-Salmeterol (ADVAIR DISKUS) 250-50 MCG/DOSE AEPB Inhale 1 puff into the lungs 2 (two) times daily. 09/01/17  Yes [name]  M, PA-C  furosemide (LASIX) 40 MG tablet Take 1 tablet (40 mg total) by mouth 2 (two) times daily. 09/20/17  Yes Gouru, Illene Silver, MD  glucose blood (ACCU-CHEK AVIVA) test strip Use as instructed daily 04/14/17  Yes Newlin, Enobong, MD  glucose blood (TRUE METRIX BLOOD GLUCOSE TEST) test strip Use as instructed 03/19/17  Yes Barrett, Rhonda G, PA-C  hydrALAZINE (APRESOLINE) 100 MG tablet Take 1 tablet (100 mg total) by mouth 2 (two) times daily. 09/28/17  Yes [name], PA-C  hydrOXYzine (ATARAX/VISTARIL) 50 MG tablet Take 1 tablet (50 mg total) by mouth 3 (three) times daily as needed for itching. 09/24/17  Yes [name], PA-C  insulin aspart (NOVOLOG) 100 UNIT/ML FlexPen Inject 10 Units into the skin 3 (three) times daily with meals. 09/20/17  Yes Gouru, Illene Silver, MD  Insulin Glargine (LANTUS SOLOSTAR) 100 UNIT/ML Solostar Pen Inject 48 Units into the skin 2 (two) times daily. 08/19/17  Yes [name], MD  isosorbide mononitrate (IMDUR) 30 MG 24 hr tablet Take 3 tablets (90 mg total) by mouth daily. 09/21/17  Yes Gouru, Illene Silver, MD  ketotifen (ZADITOR) 0.025 % ophthalmic solution Place 1 drop into  both eyes daily as needed (for dry eyes). 09/01/17  Yes [name], PA-C  Lancet Devices (ACCU-CHEK [place]) lancets Use as instructed daily. 04/14/17  Yes Charlott Rakes, MD  loratadine (CLARITIN) 10 MG tablet Take 1 tablet (10 mg total) by mouth daily. 09/28/17  Yes Burnette, Jennifer M, PA-C  MITIGARE 0.6 MG CAPS Take 1 capsule by mouth daily. 06/30/17  Yes Newlin, Charlane Ferretti, MD  montelukast (SINGULAIR) 10 MG tablet Take 1 tablet (10 mg total) by mouth at bedtime. 09/28/17  Yes [name], PA-C  nitroGLYCERIN (NITROSTAT) 0.4 MG SL tablet Place 1 tablet (0.4 mg total) under the tongue every 5 (five) minutes x 3 doses as needed for chest pain. 03/19/17  Yes Barrett, Evelene Croon, PA-C  omeprazole (PRILOSEC) 20 MG capsule Take 1 capsule (20 mg total) by mouth daily. 09/24/17  Yes [name], PA-C  oxyCODONE-acetaminophen (PERCOCET/ROXICET) 5-325 MG tablet Take 1 tablet by mouth every 6 (six) hours as needed for severe pain. 09/20/17  Yes Gouru, Illene Silver, MD  pregabalin (LYRICA) 100 MG capsule Take 1 capsule (100 mg total) by mouth 3 (three) times daily. 09/01/17  Yes [name], PA-C  ticagrelor (BRILINTA) 90 MG TABS tablet Take 1 tablet (90 mg total) by mouth 2 (two) times daily. 09/07/17  Yes [name], PA-C  valACYclovir (VALTREX) 500 MG tablet Take 1 tablet (500 mg total) by mouth daily. 04/14/17  Yes Charlott Rakes, MD  Vitamin D, Ergocalciferol, (DRISDOL) 50000 units CAPS capsule Take 1 capsule (50,000 Units total) by mouth every 30 (thirty) days. 09/01/17  Yes [name], PA-C  gabapentin (NEURONTIN) 100 MG capsule Take 1 capsule (100 mg total) by mouth 3 (three) times daily. 08/19/17 09/01/17  [name], MD   Review of Systems  Constitutional: Positive for fatigue. Negative for appetite change.  HENT: Negative for congestion, postnasal drip and sore throat.   Eyes: Negative.   Respiratory: Positive for shortness of breath. Negative for chest  tightness.   Cardiovascular: Positive for leg swelling. Negative for chest pain and palpitations.  Gastrointestinal: Positive for abdominal distention. Negative for abdominal pain.  Endocrine: Negative.   Genitourinary: Negative.   Musculoskeletal: Positive for arthralgias (left leg). Negative for back pain and neck pain.  Skin: Negative.   Allergic/Immunologic: Negative.   Neurological: Negative  for dizziness and light-headedness.  Hematological: Negative for adenopathy. Does not bruise/bleed easily.  Psychiatric/Behavioral: Positive for sleep disturbance (sleeping on 2 pillows). Negative for dysphoric mood. The patient is not nervous/anxious.     Vitals:   09/30/17 1151  BP: (!) 119/56  Pulse: 73  Resp: 18  SpO2: 98%  Weight: (!) 312 lb 8 oz (141.7 kg)  Height: 5' 8"  (1.727 m)   Wt Readings from Last 3 Encounters:  09/30/17 (!) 312 lb 8 oz (141.7 kg)  09/28/17 (!) 313 lb (142 kg)  09/20/17 (!) 301 lb 8 oz (136.8 kg)   Lab Results  Component Value Date   CREATININE 2.50 (H) 09/20/2017   CREATININE 2.62 (H) 09/19/2017   CREATININE 2.82 (H) 09/18/2017   Physical Exam  Constitutional: She is oriented to person, place, and time. She appears well-developed and well-nourished.  HENT:  Head: Normocephalic and atraumatic.  Neck: Normal range of motion. Neck supple.  Cardiovascular: Normal rate and regular rhythm.  Pulmonary/Chest: Effort normal. She has no wheezes. She has no rales.  Abdominal: Soft. She exhibits distension. There is no tenderness.  Musculoskeletal: She exhibits edema (3+ pitting edema in bilateral lower legs) and tenderness.  Neurological: She is alert and oriented to person, place, and time.  Skin: Skin is warm and dry.  Psychiatric: She has a normal mood and affect. Her behavior is normal. Thought content normal.  Nursing note and vitals reviewed.  Assessment & Plan:  1: Chronic heart failure with preserved ejection fraction- - NYHA class III -  moderately fluid overloaded with lower extremity edema - weighing but isn't sure her scales are correct. Instructed her to change the batteries and to weigh in the morning and call for an overnight weight gain of >2 pounds or a weekly weight gain of >5 pounds - not adding salt. Importance of closely following a 2074m sodium diet was discussed and written dietary information was given to her about this - she says that she doesn't think her lasix is working as well as before. Discussed changing her diuretic to torsemide. She sees nephrology later today so will defer this to him and patient says that she will talk with him about this.  - saw cardiology (Bhagat) 08/05/17 & sees CBurt Knack3/25/19 - sees pulmonology (Ashby Dawes 10/05/17 - says that she's scheduled for a repeat sleep study  2: HTN- - BP looks good today - saw PCP (J. Burnette) 09/28/17 - BMP on 09/20/17 reviewed and showed sodium 140, potassium 3.8 and GFR 23  3: Diabetes- - fasting glucose in clinic today was 304; she is going to eat and take her insulin when she gets home - sees nephrology ([facility] later today  4: Lymphedema- - stage 2 - hasn't been elevating her legs consistently and she was encouraged to elevate them when she's sitting for long periods of time - had compression socks previously but she says that they cut into her skin below her knee. Encouraged her to get a larger pair and put them on daily with removal at bedtime - currently limited in exercise due to her shortness of breath - should edema persist, consider lymphapress compression boots  Patient did not bring her medications nor a list. Each medication was verbally reviewed with the patient and she was encouraged to bring the bottles to every visit to confirm accuracy of list.  Return in 6 weeks or sooner for any questions/problems before then.

## 2017-09-30 NOTE — Patient Instructions (Addendum)
Continue weighing daily and call for an overnight weight gain of > 2 pounds or a weekly weight gain of >5 pounds.  Discuss with Dr. Juleen China about changing your fluid pill to torsemide     Low-Sodium Eating Plan Sodium, which is an element that makes up salt, helps you maintain a healthy balance of fluids in your body. Too much sodium can increase your blood pressure and cause fluid and waste to be held in your body. Your health care provider or dietitian may recommend following this plan if you have high blood pressure (hypertension), kidney disease, liver disease, or heart failure. Eating less sodium can help lower your blood pressure, reduce swelling, and protect your heart, liver, and kidneys. What are tips for following this plan? General guidelines  Most people on this plan should limit their sodium intake to 1,500-2,000 mg (milligrams) of sodium each day. Reading food labels  The Nutrition Facts label lists the amount of sodium in one serving of the food. If you eat more than one serving, you must multiply the listed amount of sodium by the number of servings.  Choose foods with less than 140 mg of sodium per serving.  Avoid foods with 300 mg of sodium or more per serving. Shopping  Look for lower-sodium products, often labeled as "low-sodium" or "no salt added."  Always check the sodium content even if foods are labeled as "unsalted" or "no salt added".  Buy fresh foods. ? Avoid canned foods and premade or frozen meals. ? Avoid canned, cured, or processed meats  Buy breads that have less than 80 mg of sodium per slice. Cooking  Eat more home-cooked food and less restaurant, buffet, and fast food.  Avoid adding salt when cooking. Use salt-free seasonings or herbs instead of table salt or sea salt. Check with your health care provider or pharmacist before using salt substitutes.  Cook with plant-based oils, such as canola, sunflower, or olive oil. Meal planning  When  eating at a restaurant, ask that your food be prepared with less salt or no salt, if possible.  Avoid foods that contain MSG (monosodium glutamate). MSG is sometimes added to [place] food, bouillon, and some canned foods. What foods are recommended? The items listed may not be a complete list. Talk with your dietitian about what dietary choices are best for you. Grains Low-sodium cereals, including oats, puffed wheat and rice, and shredded wheat. Low-sodium crackers. Unsalted rice. Unsalted pasta. Low-sodium bread. Whole-grain breads and whole-grain pasta. Vegetables Fresh or frozen vegetables. "No salt added" canned vegetables. "No salt added" tomato sauce and paste. Low-sodium or reduced-sodium tomato and vegetable juice. Fruits Fresh, frozen, or canned fruit. Fruit juice. Meats and other protein foods Fresh or frozen (no salt added) meat, poultry, seafood, and fish. Low-sodium canned tuna and salmon. Unsalted nuts. Dried peas, beans, and lentils without added salt. Unsalted canned beans. Eggs. Unsalted nut butters. Dairy Milk. Soy milk. Cheese that is naturally low in sodium, such as ricotta cheese, fresh mozzarella, or Swiss cheese Low-sodium or reduced-sodium cheese. Cream cheese. Yogurt. Fats and oils Unsalted butter. Unsalted margarine with no trans fat. Vegetable oils such as canola or olive oils. Seasonings and other foods Fresh and dried herbs and spices. Salt-free seasonings. Low-sodium mustard and ketchup. Sodium-free salad dressing. Sodium-free light mayonnaise. Fresh or refrigerated horseradish. Lemon juice. Vinegar. Homemade, reduced-sodium, or low-sodium soups. Unsalted popcorn and pretzels. Low-salt or salt-free chips. What foods are not recommended? The items listed may not be a complete list. Talk with  your dietitian about what dietary choices are best for you. Grains Instant hot cereals. Bread stuffing, pancake, and biscuit mixes. Croutons. Seasoned rice or pasta mixes.  Noodle soup cups. Boxed or frozen macaroni and cheese. Regular salted crackers. Self-rising flour. Vegetables Sauerkraut, pickled vegetables, and relishes. Olives. [place] fries. Onion rings. Regular canned vegetables (not low-sodium or reduced-sodium). Regular canned tomato sauce and paste (not low-sodium or reduced-sodium). Regular tomato and vegetable juice (not low-sodium or reduced-sodium). Frozen vegetables in sauces. Meats and other protein foods Meat or fish that is salted, canned, smoked, spiced, or pickled. Bacon, ham, sausage, hotdogs, corned beef, chipped beef, packaged lunch meats, salt pork, jerky, pickled herring, anchovies, regular canned tuna, sardines, salted nuts. Dairy Processed cheese and cheese spreads. Cheese curds. Blue cheese. Feta cheese. String cheese. Regular cottage cheese. Buttermilk. Canned milk. Fats and oils Salted butter. Regular margarine. Ghee. Bacon fat. Seasonings and other foods Onion salt, garlic salt, seasoned salt, table salt, and sea salt. Canned and packaged gravies. Worcestershire sauce. Tartar sauce. Barbecue sauce. Teriyaki sauce. Soy sauce, including reduced-sodium. Steak sauce. Fish sauce. Oyster sauce. Cocktail sauce. Horseradish that you find on the shelf. Regular ketchup and mustard. Meat flavorings and tenderizers. Bouillon cubes. Hot sauce and Tabasco sauce. Premade or packaged marinades. Premade or packaged taco seasonings. Relishes. Regular salad dressings. Salsa. Potato and tortilla chips. Corn chips and puffs. Salted popcorn and pretzels. Canned or dried soups. Pizza. Frozen entrees and pot pies. Summary  Eating less sodium can help lower your blood pressure, reduce swelling, and protect your heart, liver, and kidneys.  Most people on this plan should limit their sodium intake to 1,500-2,000 mg (milligrams) of sodium each day.  Canned, boxed, and frozen foods are high in sodium. Restaurant foods, fast foods, and pizza are also very high in  sodium. You also get sodium by adding salt to food.  Try to cook at home, eat more fresh fruits and vegetables, and eat less fast food, canned, processed, or prepared foods. This information is not intended to replace advice given to you by your health care provider. Make sure you discuss any questions you have with your health care provider. Document Released: 12/19/2001 Document Revised: 06/22/2016 Document Reviewed: 06/22/2016 Elsevier Interactive Patient Education  [employer].

## 2017-10-01 ENCOUNTER — Telehealth: Payer: Self-pay | Admitting: Physician Assistant

## 2017-10-01 DIAGNOSIS — M1A39X0 Chronic gout due to renal impairment, multiple sites, without tophus (tophi): Secondary | ICD-10-CM

## 2017-10-01 MED ORDER — MITIGARE 0.6 MG PO CAPS: 1.0000 | ORAL_CAPSULE | Freq: Every day | ORAL | 1 refills | Status: DC

## 2017-10-01 NOTE — Telephone Encounter (Signed)
refilled 

## 2017-10-01 NOTE — Telephone Encounter (Signed)
Would like to have a refill on her [facility] on [address] and [address].

## 2017-10-03 ENCOUNTER — Encounter: Payer: Self-pay | Admitting: Emergency Medicine

## 2017-10-03 ENCOUNTER — Inpatient Hospital Stay
Admission: EM | Admit: 2017-10-03 | Discharge: 2017-10-11 | DRG: 291 | Disposition: A | Discharge status: Home Health | Payer: Medicaid Other | Attending: Family Medicine | Admitting: Family Medicine

## 2017-10-03 ENCOUNTER — Other Ambulatory Visit: Payer: Self-pay

## 2017-10-03 ENCOUNTER — Emergency Department: Payer: Medicaid Other

## 2017-10-03 DIAGNOSIS — N184 Chronic kidney disease, stage 4 (severe): Secondary | ICD-10-CM | POA: Diagnosis not present

## 2017-10-03 DIAGNOSIS — I5033 Acute on chronic diastolic (congestive) heart failure: Secondary | ICD-10-CM | POA: Diagnosis present

## 2017-10-03 DIAGNOSIS — D631 Anemia in chronic kidney disease: Secondary | ICD-10-CM | POA: Diagnosis present

## 2017-10-03 DIAGNOSIS — I2510 Atherosclerotic heart disease of native coronary artery without angina pectoris: Secondary | ICD-10-CM | POA: Diagnosis present

## 2017-10-03 DIAGNOSIS — I350 Nonrheumatic aortic (valve) stenosis: Secondary | ICD-10-CM | POA: Diagnosis present

## 2017-10-03 DIAGNOSIS — B182 Chronic viral hepatitis C: Secondary | ICD-10-CM | POA: Diagnosis present

## 2017-10-03 DIAGNOSIS — Y92230 Patient room in hospital as the place of occurrence of the external cause: Secondary | ICD-10-CM | POA: Diagnosis present

## 2017-10-03 DIAGNOSIS — Z7951 Long term (current) use of inhaled steroids: Secondary | ICD-10-CM

## 2017-10-03 DIAGNOSIS — R011 Cardiac murmur, unspecified: Secondary | ICD-10-CM | POA: Diagnosis present

## 2017-10-03 DIAGNOSIS — E1122 Type 2 diabetes mellitus with diabetic chronic kidney disease: Secondary | ICD-10-CM | POA: Diagnosis present

## 2017-10-03 DIAGNOSIS — I472 Ventricular tachycardia: Secondary | ICD-10-CM | POA: Diagnosis not present

## 2017-10-03 DIAGNOSIS — Z6841 Body Mass Index (BMI) 40.0 and over, adult: Secondary | ICD-10-CM

## 2017-10-03 DIAGNOSIS — J449 Chronic obstructive pulmonary disease, unspecified: Secondary | ICD-10-CM | POA: Diagnosis present

## 2017-10-03 DIAGNOSIS — Z9049 Acquired absence of other specified parts of digestive tract: Secondary | ICD-10-CM

## 2017-10-03 DIAGNOSIS — K219 Gastro-esophageal reflux disease without esophagitis: Secondary | ICD-10-CM | POA: Diagnosis present

## 2017-10-03 DIAGNOSIS — J9601 Acute respiratory failure with hypoxia: Secondary | ICD-10-CM | POA: Diagnosis present

## 2017-10-03 DIAGNOSIS — G4733 Obstructive sleep apnea (adult) (pediatric): Secondary | ICD-10-CM | POA: Diagnosis present

## 2017-10-03 DIAGNOSIS — R0902 Hypoxemia: Secondary | ICD-10-CM | POA: Diagnosis not present

## 2017-10-03 DIAGNOSIS — N179 Acute kidney failure, unspecified: Secondary | ICD-10-CM | POA: Diagnosis present

## 2017-10-03 DIAGNOSIS — K279 Peptic ulcer, site unspecified, unspecified as acute or chronic, without hemorrhage or perforation: Secondary | ICD-10-CM | POA: Diagnosis present

## 2017-10-03 DIAGNOSIS — R0600 Dyspnea, unspecified: Secondary | ICD-10-CM | POA: Diagnosis not present

## 2017-10-03 DIAGNOSIS — E785 Hyperlipidemia, unspecified: Secondary | ICD-10-CM | POA: Diagnosis present

## 2017-10-03 DIAGNOSIS — Z955 Presence of coronary angioplasty implant and graft: Secondary | ICD-10-CM

## 2017-10-03 DIAGNOSIS — Z87891 Personal history of nicotine dependence: Secondary | ICD-10-CM

## 2017-10-03 DIAGNOSIS — Z9111 Patient's noncompliance with dietary regimen: Secondary | ICD-10-CM | POA: Diagnosis not present

## 2017-10-03 DIAGNOSIS — Z888 Allergy status to other drugs, medicaments and biological substances status: Secondary | ICD-10-CM

## 2017-10-03 DIAGNOSIS — Z885 Allergy status to narcotic agent status: Secondary | ICD-10-CM

## 2017-10-03 DIAGNOSIS — E1140 Type 2 diabetes mellitus with diabetic neuropathy, unspecified: Secondary | ICD-10-CM | POA: Diagnosis present

## 2017-10-03 DIAGNOSIS — N183 Chronic kidney disease, stage 3 (moderate): Secondary | ICD-10-CM | POA: Diagnosis present

## 2017-10-03 DIAGNOSIS — I130 Hypertensive heart and chronic kidney disease with heart failure and stage 1 through stage 4 chronic kidney disease, or unspecified chronic kidney disease: Secondary | ICD-10-CM | POA: Diagnosis present

## 2017-10-03 DIAGNOSIS — Z531 Procedure and treatment not carried out because of patient's decision for reasons of belief and group pressure: Secondary | ICD-10-CM | POA: Diagnosis present

## 2017-10-03 DIAGNOSIS — M1A39X0 Chronic gout due to renal impairment, multiple sites, without tophus (tophi): Secondary | ICD-10-CM | POA: Diagnosis present

## 2017-10-03 DIAGNOSIS — R0602 Shortness of breath: Secondary | ICD-10-CM

## 2017-10-03 DIAGNOSIS — Z794 Long term (current) use of insulin: Secondary | ICD-10-CM

## 2017-10-03 DIAGNOSIS — Z9119 Patient's noncompliance with other medical treatment and regimen: Secondary | ICD-10-CM

## 2017-10-03 DIAGNOSIS — N189 Chronic kidney disease, unspecified: Secondary | ICD-10-CM | POA: Diagnosis not present

## 2017-10-03 DIAGNOSIS — N171 Acute kidney failure with acute cortical necrosis: Secondary | ICD-10-CM | POA: Diagnosis not present

## 2017-10-03 DIAGNOSIS — E1165 Type 2 diabetes mellitus with hyperglycemia: Secondary | ICD-10-CM | POA: Diagnosis present

## 2017-10-03 DIAGNOSIS — Z9851 Tubal ligation status: Secondary | ICD-10-CM

## 2017-10-03 DIAGNOSIS — Z7982 Long term (current) use of aspirin: Secondary | ICD-10-CM

## 2017-10-03 DIAGNOSIS — Z79899 Other long term (current) drug therapy: Secondary | ICD-10-CM

## 2017-10-03 DIAGNOSIS — G4730 Sleep apnea, unspecified: Secondary | ICD-10-CM | POA: Diagnosis not present

## 2017-10-03 DIAGNOSIS — R601 Generalized edema: Secondary | ICD-10-CM | POA: Diagnosis not present

## 2017-10-03 DIAGNOSIS — T501X5A Adverse effect of loop [high-ceiling] diuretics, initial encounter: Secondary | ICD-10-CM | POA: Diagnosis present

## 2017-10-03 DIAGNOSIS — Z833 Family history of diabetes mellitus: Secondary | ICD-10-CM

## 2017-10-03 DIAGNOSIS — Z8249 Family history of ischemic heart disease and other diseases of the circulatory system: Secondary | ICD-10-CM

## 2017-10-03 DIAGNOSIS — Z91013 Allergy to seafood: Secondary | ICD-10-CM

## 2017-10-03 DIAGNOSIS — Z800 Family history of malignant neoplasm of digestive organs: Secondary | ICD-10-CM

## 2017-10-03 DIAGNOSIS — Z7902 Long term (current) use of antithrombotics/antiplatelets: Secondary | ICD-10-CM

## 2017-10-03 HISTORY — DX: Acute on chronic diastolic (congestive) heart failure: I50.33

## 2017-10-03 LAB — HEPATIC FUNCTION PANEL
ALT: 74 U/L — ABNORMAL HIGH (ref 14–54)
AST: 74 U/L — ABNORMAL HIGH (ref 15–41)
Albumin: 3.5 g/dL (ref 3.5–5.0)
Alkaline Phosphatase: 393 U/L — ABNORMAL HIGH (ref 38–126)
Bilirubin, Direct: 0.2 mg/dL (ref 0.1–0.5)
Indirect Bilirubin: 0.6 mg/dL (ref 0.3–0.9)
Total Bilirubin: 0.8 mg/dL (ref 0.3–1.2)
Total Protein: 7.3 g/dL (ref 6.5–8.1)

## 2017-10-03 LAB — URINALYSIS, COMPLETE (UACMP) WITH MICROSCOPIC
Bacteria, UA: NONE SEEN
Bilirubin Urine: NEGATIVE
Glucose, UA: NEGATIVE mg/dL
Hgb urine dipstick: NEGATIVE
Ketones, ur: NEGATIVE mg/dL
Leukocytes, UA: NEGATIVE
Nitrite: NEGATIVE
Protein, ur: NEGATIVE mg/dL
RBC / HPF: NONE SEEN RBC/hpf (ref 0–5)
Specific Gravity, Urine: 1.008 (ref 1.005–1.030)
pH: 5 (ref 5.0–8.0)

## 2017-10-03 LAB — CBC
HCT: 26.4 % — ABNORMAL LOW (ref 35.0–47.0)
Hemoglobin: 8.5 g/dL — ABNORMAL LOW (ref 12.0–16.0)
MCH: 27.5 pg (ref 26.0–34.0)
MCHC: 32.1 g/dL (ref 32.0–36.0)
MCV: 85.7 fL (ref 80.0–100.0)
Platelets: 205 10*3/uL (ref 150–440)
RBC: 3.08 MIL/uL — ABNORMAL LOW (ref 3.80–5.20)
RDW: 18.8 % — ABNORMAL HIGH (ref 11.5–14.5)
WBC: 6.7 10*3/uL (ref 3.6–11.0)

## 2017-10-03 LAB — BASIC METABOLIC PANEL
Anion gap: 12 (ref 5–15)
BUN: 95 mg/dL — ABNORMAL HIGH (ref 6–20)
CO2: 21 mmol/L — ABNORMAL LOW (ref 22–32)
Calcium: 8.7 mg/dL — ABNORMAL LOW (ref 8.9–10.3)
Chloride: 105 mmol/L (ref 101–111)
Creatinine, Ser: 2.73 mg/dL — ABNORMAL HIGH (ref 0.44–1.00)
GFR calc Af Amer: 21 mL/min — ABNORMAL LOW (ref >60)
GFR calc non Af Amer: 18 mL/min — ABNORMAL LOW (ref >60)
Glucose, Bld: 298 mg/dL — ABNORMAL HIGH (ref 65–99)
Potassium: 4.5 mmol/L (ref 3.5–5.1)
Sodium: 138 mmol/L (ref 135–145)

## 2017-10-03 LAB — BLOOD GAS, ARTERIAL
Acid-base deficit: 1.6 mmol/L (ref 0.0–2.0)
Bicarbonate: 22.8 mmol/L (ref 20.0–28.0)
FIO2: 0.21
O2 Saturation: 75.5 %
Patient temperature: 37
pCO2 arterial: 36 mmHg (ref 32.0–48.0)
pH, Arterial: 7.41 (ref 7.350–7.450)
pO2, Arterial: 40 mmHg — CL (ref 83.0–108.0)

## 2017-10-03 LAB — GLUCOSE, CAPILLARY: Glucose-Capillary: 273 mg/dL — ABNORMAL HIGH (ref 65–99)

## 2017-10-03 LAB — TROPONIN I: Troponin I: <0.03 ng/mL (ref <0.03)

## 2017-10-03 LAB — PROTIME-INR
INR: 1.01
Prothrombin Time: 13.2 seconds (ref 11.4–15.2)

## 2017-10-03 LAB — BRAIN NATRIURETIC PEPTIDE: B Natriuretic Peptide: 147 pg/mL — ABNORMAL HIGH (ref 0.0–100.0)

## 2017-10-03 MED ORDER — FUROSEMIDE 10 MG/ML IJ SOLN
10.0000 mg/h | INTRAMUSCULAR | Status: AC
  Administered 2017-10-03: 10 mg/h via INTRAVENOUS
  Filled 2017-10-03: qty 25

## 2017-10-03 MED ORDER — ACETAMINOPHEN 650 MG RE SUPP: 650.0000 mg | Freq: Four times a day (QID) | RECTAL | Status: DC | PRN

## 2017-10-03 MED ORDER — TRAZODONE HCL 50 MG PO TABS
25.0000 mg | ORAL_TABLET | Freq: Every evening | ORAL | Status: DC | PRN
  Administered 2017-10-07: 25 mg via ORAL
  Filled 2017-10-03: qty 1

## 2017-10-03 MED ORDER — TICAGRELOR 90 MG PO TABS
90.0000 mg | ORAL_TABLET | Freq: Two times a day (BID) | ORAL | Status: DC
  Administered 2017-10-03 – 2017-10-11 (×16): 90 mg via ORAL
  Filled 2017-10-03 (×16): qty 1

## 2017-10-03 MED ORDER — ACETAMINOPHEN 325 MG PO TABS: 650.0000 mg | ORAL_TABLET | Freq: Four times a day (QID) | ORAL | Status: DC | PRN

## 2017-10-03 MED ORDER — ATORVASTATIN CALCIUM 20 MG PO TABS
80.0000 mg | ORAL_TABLET | Freq: Every day | ORAL | Status: DC
  Administered 2017-10-03 – 2017-10-10 (×8): 80 mg via ORAL
  Filled 2017-10-03 (×2): qty 4
  Filled 2017-10-03: qty 8
  Filled 2017-10-03 (×5): qty 4

## 2017-10-03 MED ORDER — NITROGLYCERIN 0.4 MG SL SUBL
0.4000 mg | SUBLINGUAL_TABLET | SUBLINGUAL | Status: DC | PRN
Start: 2017-10-03 — End: 2017-10-11

## 2017-10-03 MED ORDER — DOCUSATE SODIUM 100 MG PO CAPS
100.0000 mg | ORAL_CAPSULE | Freq: Two times a day (BID) | ORAL | Status: DC
  Administered 2017-10-07 – 2017-10-09 (×3): 100 mg via ORAL
  Filled 2017-10-03 (×11): qty 1

## 2017-10-03 MED ORDER — ENOXAPARIN SODIUM 40 MG/0.4ML ~~LOC~~ SOLN
40.0000 mg | Freq: Two times a day (BID) | SUBCUTANEOUS | Status: DC
  Administered 2017-10-03 – 2017-10-04 (×2): 40 mg via SUBCUTANEOUS
  Filled 2017-10-03 (×2): qty 0.4

## 2017-10-03 MED ORDER — FUROSEMIDE 10 MG/ML IJ SOLN
40.0000 mg | Freq: Once | INTRAMUSCULAR | Status: AC
  Administered 2017-10-03: 40 mg via INTRAVENOUS
  Filled 2017-10-03: qty 4

## 2017-10-03 MED ORDER — PANTOPRAZOLE SODIUM 40 MG PO TBEC
40.0000 mg | DELAYED_RELEASE_TABLET | Freq: Every day | ORAL | Status: DC
  Administered 2017-10-04 – 2017-10-11 (×8): 40 mg via ORAL
  Filled 2017-10-03 (×8): qty 1

## 2017-10-03 MED ORDER — MONTELUKAST SODIUM 10 MG PO TABS
10.0000 mg | ORAL_TABLET | Freq: Every day | ORAL | Status: DC
  Administered 2017-10-03 – 2017-10-10 (×8): 10 mg via ORAL
  Filled 2017-10-03 (×8): qty 1

## 2017-10-03 MED ORDER — BISACODYL 5 MG PO TBEC: 5.0000 mg | DELAYED_RELEASE_TABLET | Freq: Every day | ORAL | Status: DC | PRN

## 2017-10-03 MED ORDER — VITAMIN D (ERGOCALCIFEROL) 1.25 MG (50000 UNIT) PO CAPS
50000.0000 [IU] | ORAL_CAPSULE | ORAL | Status: DC
  Filled 2017-10-03: qty 1

## 2017-10-03 MED ORDER — ALBUTEROL SULFATE HFA 108 (90 BASE) MCG/ACT IN AERS: 2.0000 | INHALATION_SPRAY | RESPIRATORY_TRACT | Status: DC | PRN

## 2017-10-03 MED ORDER — MOMETASONE FURO-FORMOTEROL FUM 200-5 MCG/ACT IN AERO
2.0000 | INHALATION_SPRAY | Freq: Two times a day (BID) | RESPIRATORY_TRACT | Status: DC
  Administered 2017-10-03 – 2017-10-11 (×16): 2 via RESPIRATORY_TRACT
  Filled 2017-10-03: qty 8.8

## 2017-10-03 MED ORDER — INSULIN GLARGINE 100 UNIT/ML ~~LOC~~ SOLN
48.0000 [IU] | Freq: Two times a day (BID) | SUBCUTANEOUS | Status: DC
  Administered 2017-10-03 – 2017-10-06 (×6): 48 [IU] via SUBCUTANEOUS
  Filled 2017-10-03 (×7): qty 0.48

## 2017-10-03 MED ORDER — ENALAPRILAT 1.25 MG/ML IV INJ
0.6250 mg | INJECTION | Freq: Once | INTRAVENOUS | Status: AC
Start: 2017-10-03 — End: 2017-10-03
  Administered 2017-10-03: 0.625 mg via INTRAVENOUS
  Filled 2017-10-03: qty 2

## 2017-10-03 MED ORDER — ALBUTEROL SULFATE (2.5 MG/3ML) 0.083% IN NEBU
2.5000 mg | INHALATION_SOLUTION | Freq: Four times a day (QID) | RESPIRATORY_TRACT | Status: DC | PRN
  Administered 2017-10-04: 2.5 mg via RESPIRATORY_TRACT
  Filled 2017-10-03: qty 3

## 2017-10-03 MED ORDER — CARVEDILOL 25 MG PO TABS
25.0000 mg | ORAL_TABLET | Freq: Two times a day (BID) | ORAL | Status: DC
  Administered 2017-10-03 – 2017-10-11 (×16): 25 mg via ORAL
  Filled 2017-10-03 (×16): qty 1

## 2017-10-03 MED ORDER — ALLOPURINOL 100 MG PO TABS
100.0000 mg | ORAL_TABLET | Freq: Every day | ORAL | Status: DC
  Administered 2017-10-04 – 2017-10-11 (×8): 100 mg via ORAL
  Filled 2017-10-03 (×8): qty 1

## 2017-10-03 MED ORDER — ISOSORBIDE MONONITRATE ER 60 MG PO TB24
90.0000 mg | ORAL_TABLET | Freq: Every day | ORAL | Status: DC
  Administered 2017-10-04 – 2017-10-11 (×8): 90 mg via ORAL
  Filled 2017-10-03 (×8): qty 2

## 2017-10-03 MED ORDER — INSULIN ASPART 100 UNIT/ML FLEXPEN: 10.0000 [IU] | PEN_INJECTOR | Freq: Three times a day (TID) | SUBCUTANEOUS | Status: DC

## 2017-10-03 MED ORDER — AMLODIPINE BESYLATE 5 MG PO TABS
10.0000 mg | ORAL_TABLET | Freq: Every day | ORAL | Status: DC
  Administered 2017-10-04 – 2017-10-05 (×2): 10 mg via ORAL
  Filled 2017-10-03 (×2): qty 2

## 2017-10-03 MED ORDER — INSULIN ASPART 100 UNIT/ML ~~LOC~~ SOLN
0.0000 [IU] | Freq: Three times a day (TID) | SUBCUTANEOUS | Status: DC
  Administered 2017-10-04 (×2): 5 [IU] via SUBCUTANEOUS
  Administered 2017-10-04 – 2017-10-05 (×2): 3 [IU] via SUBCUTANEOUS
  Administered 2017-10-05: 5 [IU] via SUBCUTANEOUS
  Administered 2017-10-05: 3 [IU] via SUBCUTANEOUS
  Administered 2017-10-06: 7 [IU] via SUBCUTANEOUS
  Filled 2017-10-03 (×7): qty 1

## 2017-10-03 MED ORDER — ORAL CARE MOUTH RINSE
15.0000 mL | Freq: Two times a day (BID) | OROMUCOSAL | Status: DC
  Administered 2017-10-03 – 2017-10-11 (×10): 15 mL via OROMUCOSAL

## 2017-10-03 MED ORDER — DOCUSATE SODIUM 100 MG PO CAPS: 100.0000 mg | ORAL_CAPSULE | Freq: Two times a day (BID) | ORAL | Status: DC | PRN

## 2017-10-03 MED ORDER — ONDANSETRON HCL 4 MG/2ML IJ SOLN: 4.0000 mg | Freq: Four times a day (QID) | INTRAMUSCULAR | Status: DC | PRN

## 2017-10-03 MED ORDER — ASPIRIN EC 81 MG PO TBEC
81.0000 mg | DELAYED_RELEASE_TABLET | Freq: Every day | ORAL | Status: DC
Start: 2017-10-03 — End: 2017-10-11
  Administered 2017-10-04 – 2017-10-11 (×8): 81 mg via ORAL
  Filled 2017-10-03 (×8): qty 1

## 2017-10-03 MED ORDER — ONDANSETRON HCL 4 MG PO TABS: 4.0000 mg | ORAL_TABLET | Freq: Four times a day (QID) | ORAL | Status: DC | PRN

## 2017-10-03 NOTE — ED Triage Notes (Signed)
Patient presents to the ED via [facility].  Patient presents to the ED with increased swelling over the past couple days.  Patient's face appears very swollen, eyelids, cheeks, and lips are very puffy.  Patient also became very short of breath transferring from EMS stretcher to wheelchair.  Patient placed on 2L [place] by this triage nurse.  Patient having difficulty speaking in full sentences. Patient reports history of CHF and stage 4 kidney disease.  STates she is not on dialysis.

## 2017-10-03 NOTE — H&P (Signed)
[place] [facility] [place] NAME: [name]    MR#:  [id number]  DATE OF BIRTH:  December 20, 1955  DATE OF ADMISSION:  10/03/2017  PRIMARY CARE PHYSICIAN: [name], PA-C   REQUESTING/REFERRING PHYSICIAN: Conni Slipper  CHIEF COMPLAINT: Shortness of breath   Chief Complaint  Patient presents with  . Shortness of Breath  . Facial Swelling    HISTORY OF PRESENT ILLNESS:  [name]  is a 62 y.o. female with a known history chronic systolic, diastolic heart failure, CAD, diabetes mellitus type 2, hep C, hypertension, obstructive sleep apnea because of shortness of breath since Thursday getting progressively worse associated with 6 pound weight gain since Thursday, orthopnea, PND, pedal edema, worsening shortness of breath.  Cannot hypoxic with sats of upper 80s on room air and now she is on 3 L and saturation patient has history of stage IV kidney disease.  Admitted for times in the last 6 months.  Charged on March 11.  Patient received Lasix drip during the last admission.  generalized body swelling including face also. PAST MEDICAL HISTORY:   Past Medical History:  Diagnosis Date  . Aortic stenosis    Echo 8/18: mean 13, peak 28, LVOT/AV mean velocity 0.51  . Asthma    As a child   . Bronchitis   . CAD (coronary artery disease)    a. 09/2016: 50% Ost 1st Mrg stenosis, 50% 2nd Mrg stenosis, 20% Mid-Cx, 95% Prox LAD, 40% mid-LAD, and 10% dist-LAD stenosis. Staged PCI with DES to Prox-LAD.   [name] Chronic combined systolic and diastolic CHF (congestive heart failure) ([place]) 2011   echo 2/18: EF 55-60, normal wall motion, grade 2 diastolic dysfunction, trivial AI // echo 3/18: Septal and apical HK, EF 45-50, normal wall motion, trivial AI, mild LAE, PASP 38 // echo 8/18: EF 60-65, normal wall motion, grade 1 diastolic dysfunction, calcified aortic valve leaflets, mild aortic stenosis (mean 13, peak 28, LVOT/AV mean velocity 0.51), mild AI,  moderate MAC, mild LAE, trivial TR   . Chronic kidney disease   . Complication of anesthesia   . Diabetes mellitus Dx 1989  . Hepatitis C Dx 2013  . Hypertension Dx 1989  . Obesity   . Obstructive sleep apnea   . Pancreatitis 2013  . Refusal of blood transfusions as patient is Jehovah's Witness   . Tendinitis   . Ulcer 2010    PAST SURGICAL HISTOIRY:   Past Surgical History:  Procedure Laterality Date  . APPLICATION OF WOUND VAC Left 08/14/2017   Procedure: APPLICATION OF WOUND VAC Exchange;  Surgeon: Robert Bellow, MD;  Location: [facility] OR[facility];  Service: General;  Laterality: Left;  . CHOLECYSTECTOMY    . CORONARY STENT INTERVENTION N/A 09/18/2016   Procedure: Coronary Stent Intervention;  Surgeon: Troy Sine, MD;  Location: [place] [facility];  Service: Cardiovascular;  Laterality: N/A;  . DRESSING CHANGE UNDER ANESTHESIA Left 08/15/2017   Procedure: exploration of wound for bleeding;  Surgeon: Robert Bellow, MD;  Location: [facility] OR[facility];  Service: General;  Laterality: Left;  . EYE SURGERY    . INCISION AND DRAINAGE ABSCESS Left 08/12/2017   Procedure: INCISION AND DRAINAGE ABSCESS;  Surgeon: Robert Bellow, MD;  Location: [facility] OR[facility];  Service: General;  Laterality: Left;  . KNEE ARTHROSCOPY    . LEFT HEART CATH N/A 09/18/2016   Procedure: Left Heart Cath;  Surgeon: Troy Sine, MD;  Location: [place] [facility];  Service: Cardiovascular;  Laterality: N/A;  . LEFT HEART CATH AND CORONARY ANGIOGRAPHY N/A 09/16/2016   Procedure: Left Heart Cath and Coronary Angiography;  Surgeon: Burnell Blanks, MD;  Location: [place] [facility];  Service: Cardiovascular;  Laterality: N/A;  . LEFT HEART CATH AND CORONARY ANGIOGRAPHY N/A 04/29/2017   Procedure: LEFT HEART CATH AND CORONARY ANGIOGRAPHY;  Surgeon: Nelva Bush, MD;  Location: [place] [facility];  Service: Cardiovascular;  Laterality: N/A;  . TUBAL LIGATION    . TUBAL LIGATION      SOCIAL HISTORY:   Social History    Tobacco Use  . Smoking status: Former Smoker    Last attempt to quit: 10/25/1980    Years since quitting: 36.9  . Smokeless tobacco: Never Used  . Tobacco comment: quit smoking in 1982  Substance Use Topics  . Alcohol use: Yes    Comment: 3 times in last year    FAMILY HISTORY:   Family History  Problem Relation Age of Onset  . Colon cancer Mother   . Heart attack Other   . Heart attack Maternal Grandmother   . Hypertension Sister   . Hypertension Brother   . Diabetes Paternal Grandmother   . Breast cancer Neg Hx     DRUG ALLERGIES:   Allergies  Allergen Reactions  . Shellfish Allergy Anaphylaxis and Swelling  . Diazepam Other (See Comments)    "felt like out of body experience"  . Morphine And Related Itching    REVIEW OF SYSTEMS:  CONSTITUTIONAL: No fever, fatigue or weakness.  Has generalized anasarca. EYES: No blurred or double vision.  EARS, NOSE, AND THROAT: No tinnitus or ear pain.  RESPIRATORY: Shortness of breath, orthopnea, PND.  CARDIOVASCULAR: Pedal edema, generalized anasarca. GASTROINTESTINAL: No nausea, vomiting, diarrhea or abdominal pain.  GENITOURINARY: No dysuria, hematuria.  ENDOCRINE: No polyuria, nocturia,  HEMATOLOGY: No anemia, easy bruising or bleeding SKIN: No rash or lesion. MUSCULOSKELETAL: No joint pain or arthritis.   NEUROLOGIC: No tingling, numbness, weakness.  PSYCHIATRY: No anxiety or depression.   MEDICATIONS AT HOME:   Prior to Admission medications   Medication Sig Start Date End Date Taking? Authorizing Provider  albuterol (PROVENTIL) (2.5 MG/3ML) 0.083% nebulizer solution Take 3 mLs (2.5 mg total) by nebulization every 6 (six) hours as needed for wheezing or shortness of breath. 09/01/17  Yes [name], PA-C  albuterol (VENTOLIN HFA) 108 (90 Base) MCG/ACT inhaler Inhale 2 puffs into the lungs every 4 (four) hours as needed for wheezing or shortness of breath. 09/01/17  Yes [name], PA-C  Blood  Glucose Monitoring Suppl (ACCU-CHEK AVIVA) device Use as instructed daily. 04/14/17  Yes Charlott Rakes, MD  docusate sodium (COLACE) 100 MG capsule Take 1 capsule (100 mg total) by mouth 2 (two) times daily as needed for mild constipation. 09/20/17  Yes Gouru, Illene Silver, MD  fluticasone (FLONASE) 50 MCG/ACT nasal spray Place 2 sprays into both nostrils daily as needed for allergies or rhinitis. 09/28/17  Yes [name] M, PA-C  glucose blood (ACCU-CHEK AVIVA) test strip Use as instructed daily 04/14/17  Yes Newlin, Charlane Ferretti, MD  glucose blood (TRUE METRIX BLOOD GLUCOSE TEST) test strip Use as instructed 03/19/17  Yes Barrett, Evelene Croon, PA-C  ketotifen (ZADITOR) 0.025 % ophthalmic solution Place 1 drop into both eyes daily as needed (for dry eyes). 09/01/17  Yes [name], PA-C  Lancet Devices (ACCU-CHEK [place]) lancets Use as instructed daily. 04/14/17  Yes Charlott Rakes, MD  nitroGLYCERIN (NITROSTAT) 0.4 MG  SL tablet Place 1 tablet (0.4 mg total) under the tongue every 5 (five) minutes x 3 doses as needed for chest pain. 03/19/17  Yes Barrett, Evelene Croon, PA-C  oxyCODONE-acetaminophen (PERCOCET/ROXICET) 5-325 MG tablet Take 1 tablet by mouth every 6 (six) hours as needed for severe pain. 09/20/17  Yes Gouru, Illene Silver, MD  allopurinol (ZYLOPRIM) 100 MG tablet Take 1 tablet (100 mg total) by mouth daily. 09/01/17   [name], PA-C  amLODipine (NORVASC) 10 MG tablet Take 1 tablet (10 mg total) by mouth daily. 09/24/17   [name], PA-C  aspirin EC 81 MG EC tablet Take 1 tablet (81 mg total) by mouth daily. 09/19/16   Strader, Fransisco Hertz, PA-C  atorvastatin (LIPITOR) 80 MG tablet Take 1 tablet (80 mg total) by mouth daily at 6 PM. 09/24/17   Burnette, Clearnce Sorrel, PA-C  carvedilol (COREG) 25 MG tablet TAKE 1 TABLET BY MOUTH 2 TIMES DAILY WITH A MEAL. 02/12/17   Funches, Josalyn, MD  Dulaglutide (TRULICITY) 4.00 QQ/7.6PP SOPN Inject 0.75 mg into the skin once a week. 09/28/17   [name], PA-C  ferrous sulfate 325 (65 FE) MG tablet Take 1 tablet (325 mg total) by mouth 2 (two) times daily with a meal. 09/24/17   Burnette, Clearnce Sorrel, PA-C  Fluticasone-Salmeterol (ADVAIR DISKUS) 250-50 MCG/DOSE AEPB Inhale 1 puff into the lungs 2 (two) times daily. 09/01/17   [name], PA-C  furosemide (LASIX) 40 MG tablet Take 1 tablet (40 mg total) by mouth 2 (two) times daily. 09/20/17   Nicholes Mango, MD  hydrALAZINE (APRESOLINE) 100 MG tablet Take 1 tablet (100 mg total) by mouth 2 (two) times daily. 09/28/17   [name], PA-C  hydrOXYzine (ATARAX/VISTARIL) 50 MG tablet Take 1 tablet (50 mg total) by mouth 3 (three) times daily as needed for itching. 09/24/17   [name], PA-C  insulin aspart (NOVOLOG) 100 UNIT/ML FlexPen Inject 10 Units into the skin 3 (three) times daily with meals. 09/20/17   Nicholes Mango, MD  Insulin Glargine (LANTUS SOLOSTAR) 100 UNIT/ML Solostar Pen Inject 48 Units into the skin 2 (two) times daily. 08/19/17   [name], MD  isosorbide mononitrate (IMDUR) 30 MG 24 hr tablet Take 3 tablets (90 mg total) by mouth daily. 09/21/17   Nicholes Mango, MD  loratadine (CLARITIN) 10 MG tablet Take 1 tablet (10 mg total) by mouth daily. 09/28/17   [name], PA-C  MITIGARE 0.6 MG CAPS Take 1 capsule by mouth daily. 10/01/17   [name], PA-C  montelukast (SINGULAIR) 10 MG tablet Take 1 tablet (10 mg total) by mouth at bedtime. 09/28/17   [name], PA-C  omeprazole (PRILOSEC) 20 MG capsule Take 1 capsule (20 mg total) by mouth daily. 09/24/17   [name], PA-C  pregabalin (LYRICA) 100 MG capsule Take 1 capsule (100 mg total) by mouth 3 (three) times daily. 09/01/17   [name], PA-C  ticagrelor (BRILINTA) 90 MG TABS tablet Take 1 tablet (90 mg total) by mouth 2 (two) times daily. 09/07/17   [name], PA-C  valACYclovir (VALTREX) 500 MG tablet Take 1 tablet (500 mg total) by mouth daily.  04/14/17   Charlott Rakes, MD  Vitamin D, Ergocalciferol, (DRISDOL) 50000 units CAPS capsule Take 1 capsule (50,000 Units total) by mouth every 30 (thirty) days. 09/01/17   [name], PA-C  gabapentin (NEURONTIN) 100 MG capsule Take 1 capsule (100 mg total) by mouth  3 (three) times daily. 08/19/17 09/01/17  [name], MD      VITAL SIGNS:  Blood pressure (!) 144/68, pulse 71, temperature 98.3 F (36.8 C), temperature source Oral, resp. rate 16, height 5' 8"  (1.727 m), weight (!) 137 kg (302 lb), SpO2 98 %.  PHYSICAL EXAMINATION:  GENERAL:  62 y.o.-year-old patient lying in the bed with no acute distress.  EYES: Pupils equal, round, reactive to light n. No scleral icterus. Extraocular muscles intact.  HEENT: Head atraumatic, normocephalic. Oropharynx and nasopharynx clear.  NECK:  Supple,  jugular venous distention. No thyroid enlargement, no tenderness.  LUNGS:  has decreased breath sounds bilaterally with rales. CARDIOVASCULAR: S1, S2 normal. No murmurs, rubs, or gallops.  ABDOMEN: Soft, nontender, nondistended. Bowel sounds present. No organomegaly or mass.  EXTREMITIES: Patient has pitting pedal edema up to knees bilaterally. NEUROLOGIC: Cranial nerves II through XII are intact. Muscle strength 5/5 in all extremities. Sensation intact. Gait not checked.  PSYCHIATRIC: The patient is alert and oriented x 3.  She is very sleepy. SKIN: No obvious rash, lesion, or ulcer.   LABORATORY PANEL:   CBC Recent Labs  Lab 10/03/17 1500  WBC 6.7  HGB 8.5*  HCT 26.4*  PLT 205   ------------------------------------------------------------------------------------------------------------------  Chemistries  Recent Labs  Lab 10/03/17 1500 10/03/17 1628  NA 138  --   K 4.5  --   CL 105  --   CO2 21*  --   GLUCOSE 298*  --   BUN 95*  --   CREATININE 2.73*  --   CALCIUM 8.7*  --   AST  --  74*  ALT  --  74*  ALKPHOS  --  393*  BILITOT  --  0.8    ------------------------------------------------------------------------------------------------------------------  Cardiac Enzymes Recent Labs  Lab 10/03/17 1500  TROPONINI <0.03   ------------------------------------------------------------------------------------------------------------------  RADIOLOGY:  Dg Chest Port 1 View  Result Date: 10/03/2017 CLINICAL DATA:  Shortness of breath with body swelling for 1 week. History of diabetes. EXAM: PORTABLE CHEST 1 VIEW COMPARISON:  09/15/2017 and 09/14/2017 radiographs. FINDINGS: 1436 hours. The heart size is at the upper limits of normal for portable AP technique. There are mild residual ground-glass opacities throughout the lungs, improved from the previous studies. There is no confluent airspace opacity, pleural effusion or pneumothorax. The bones appear unremarkable. Telemetry leads overlie the chest. IMPRESSION: Mild residual or recurrent ground-glass pulmonary opacities, improved from earlier this month and likely edema. No new findings. Electronically Signed   By: Richardean Sale M.D.   On: 10/03/2017 15:31    EKG:   Orders placed or performed during the hospital encounter of 10/03/17  . ED EKG within 10 minutes  . ED EKG within 10 minutes  . ED EKG  . ED EKG  . EKG 12-Lead  . EKG 12-Lead   EKG shows normal sinus rhythm at 70 bpm, no ST-T changes.   IMPRESSION AND PLAN:   62 year old female patient with shortness of breath, weight gain, has history of combined heart failure, chest x-[place] showed CHF and she became hypoxic upon walking to 20 feet and sats dropped to 85%.  And now she is on 2 L of oxygen and saturations are more than 98%.  Admit to telemetry. 1 acute on chronic diastolic heart failure, patient recently discharged from hospital on March 11 comes back with weight gain, pedal edema, shortness of breath and she will be admitted to telemetry, start on IV Lasix drip, obtain nephrology consult because of her CKD  stage3 2  history of CAD, patient is on aspirin, Brilinta, beta-blockers.  Statins 3.  Morbid obesity with obstructive sleep apnea: C #5 history of COPD: No wheezing.  Continue albuterol inhaler as needed. 6.  History of diabetes mellitus type 2: Resume home dose  Lantus #.7 history of diabetic neuropathy: Continue Lyrica.  All the records are reviewed and case discussed with ED provider. Management plans discussed with the patient, family and they are in agreement.  CODE STATUS: full  TOTAL TIME TAKING CARE OF THIS PATIENT: 55 minutes.    Epifanio Lesches M.D on 10/03/2017 at 6:06 PM  Between 7am to 6pm - Pager - [phone]  After 6pm go to [email or web address] - password [email or web address] [facility]  [facility]  Office  [phone]  CC: Primary care physician; [name], PA-C  Note: This dictation was prepared with Dragon dictation along with smaller phrase technology. Any transcriptional errors that result from this process are unintentional.

## 2017-10-03 NOTE — ED Notes (Signed)
EMS pt to lobby , breathing difficulty , breathing tx en route with Sats 95%

## 2017-10-03 NOTE — ED Provider Notes (Signed)
[facility] Emergency Department Provider Note   ____________________________________________   First MD Initiated Contact with Patient 10/03/17 1510     (approximate)  I have reviewed the triage vital signs and the nursing notes.   HISTORY  Chief Complaint Shortness of Breath and Facial Swelling    HPI [name] is a 62 y.o. female Who reports gradually increasing shortness of breath and swelling diffusely for the last week or 2 hours to its been getting gradually worse. She reports she's had this before which she's had her CHF acting up. Review the old record shows that she has had anasarca with diffuse swelling in the past. She is not having itching. She is short of breath. She requires oxygen to maintain her sats. Lying still on the bed she's not having any marked complaints. When she tries to move or sit up she gets short of breath. She is taking Lasix 40 twice a day.she is not on any Ace inhibitors.   Past Medical History:  Diagnosis Date  . Aortic stenosis    Echo 8/18: mean 13, peak 28, LVOT/AV mean velocity 0.51  . Asthma    As a child   . Bronchitis   . CAD (coronary artery disease)    a. 09/2016: 50% Ost 1st Mrg stenosis, 50% 2nd Mrg stenosis, 20% Mid-Cx, 95% Prox LAD, 40% mid-LAD, and 10% dist-LAD stenosis. Staged PCI with DES to Prox-LAD.   [name] Chronic combined systolic and diastolic CHF (congestive heart failure) ([place]) 2011   echo 2/18: EF 55-60, normal wall motion, grade 2 diastolic dysfunction, trivial AI // echo 3/18: Septal and apical HK, EF 45-50, normal wall motion, trivial AI, mild LAE, PASP 38 // echo 8/18: EF 60-65, normal wall motion, grade 1 diastolic dysfunction, calcified aortic valve leaflets, mild aortic stenosis (mean 13, peak 28, LVOT/AV mean velocity 0.51), mild AI, moderate MAC, mild LAE, trivial TR   . Chronic kidney disease   . Complication of anesthesia   . Diabetes mellitus Dx 1989  . Hepatitis C Dx 2013  .  Hypertension Dx 1989  . Obesity   . Obstructive sleep apnea   . Pancreatitis 2013  . Refusal of blood transfusions as patient is Jehovah's Witness   . Tendinitis   . Ulcer 2010    Patient Active Problem List   Diagnosis Date Noted  . Chronic diastolic heart failure ([place]) 09/30/2017  . Lymphedema 09/30/2017  . Acute metabolic encephalopathy 93/57/0177  . Acute on chronic diastolic heart failure ([place]) 03/19/2017  . Unstable angina ([place]) 03/17/2017  . Aortic stenosis   . Chronic pain of right knee 02/12/2017  . Chronic gout due to renal impairment of multiple sites without tophus 02/12/2017  . Esophageal dysphagia 02/12/2017  . Osteoarthritis 01/22/2017  . Diabetic hyperosmolar non-ketotic state ([place]) 12/13/2016  . CAD (coronary artery disease) 12/13/2016  . Hyperlipidemia 12/13/2016  . Hyperphosphatemia 12/13/2016  . Asthma 11/29/2015  . Depression 09/05/2015  . GERD (gastroesophageal reflux disease) 03/25/2015  . Precordial pain   . Environmental allergies 03/14/2015  . Hep C w/o coma, chronic ([place]) 01/31/2015  . Diabetic neuropathy ([place]) 01/31/2015  . OSA (obstructive sleep apnea) 01/01/2015  . Poor dentition 11/13/2014  . Essential hypertension 10/08/2014  . Obesity 10/08/2014  . CKD (chronic kidney disease) stage 4, GFR 15-29 ml/min (HCC) 10/08/2014  . Diabetes mellitus type 2, uncontrolled, with complications ([place]) 93/90/3009    Class: Chronic  . Anemia in stage 3 chronic kidney disease ([place]) 03/25/2012  .  Chronic combined systolic and diastolic CHF (congestive heart failure) ([place]) 07/13/2009    Past Surgical History:  Procedure Laterality Date  . APPLICATION OF WOUND VAC Left 08/14/2017   Procedure: APPLICATION OF WOUND VAC Exchange;  Surgeon: Robert Bellow, MD;  Location: [facility] ORS;  Service: General;  Laterality: Left;  . CHOLECYSTECTOMY    . CORONARY STENT INTERVENTION N/A 09/18/2016   Procedure: Coronary Stent Intervention;  Surgeon: Troy Sine, MD;   Location: [place] [facility];  Service: Cardiovascular;  Laterality: N/A;  . DRESSING CHANGE UNDER ANESTHESIA Left 08/15/2017   Procedure: exploration of wound for bleeding;  Surgeon: Robert Bellow, MD;  Location: [facility] ORS;  Service: General;  Laterality: Left;  . EYE SURGERY    . INCISION AND DRAINAGE ABSCESS Left 08/12/2017   Procedure: INCISION AND DRAINAGE ABSCESS;  Surgeon: Robert Bellow, MD;  Location: [facility];  Service: General;  Laterality: Left;  . KNEE ARTHROSCOPY    . LEFT HEART CATH N/A 09/18/2016   Procedure: Left Heart Cath;  Surgeon: Troy Sine, MD;  Location: [place] [facility];  Service: Cardiovascular;  Laterality: N/A;  . LEFT HEART CATH AND CORONARY ANGIOGRAPHY N/A 09/16/2016   Procedure: Left Heart Cath and Coronary Angiography;  Surgeon: Burnell Blanks, MD;  Location: [place] [facility];  Service: Cardiovascular;  Laterality: N/A;  . LEFT HEART CATH AND CORONARY ANGIOGRAPHY N/A 04/29/2017   Procedure: LEFT HEART CATH AND CORONARY ANGIOGRAPHY;  Surgeon: Nelva Bush, MD;  Location: [place] [facility];  Service: Cardiovascular;  Laterality: N/A;  . TUBAL LIGATION    . TUBAL LIGATION      Prior to Admission medications   Medication Sig Start Date End Date Taking? Authorizing Provider  albuterol (PROVENTIL) (2.5 MG/3ML) 0.083% nebulizer solution Take 3 mLs (2.5 mg total) by nebulization every 6 (six) hours as needed for wheezing or shortness of breath. 09/01/17  Yes [name], PA-C  albuterol (VENTOLIN HFA) 108 (90 Base) MCG/ACT inhaler Inhale 2 puffs into the lungs every 4 (four) hours as needed for wheezing or shortness of breath. 09/01/17  Yes [name], PA-C  Blood Glucose Monitoring Suppl (ACCU-CHEK AVIVA) device Use as instructed daily. 04/14/17  Yes Charlott Rakes, MD  docusate sodium (COLACE) 100 MG capsule Take 1 capsule (100 mg total) by mouth 2 (two) times daily as needed for mild constipation. 09/20/17  Yes Gouru, Illene Silver, MD    fluticasone (FLONASE) 50 MCG/ACT nasal spray Place 2 sprays into both nostrils daily as needed for allergies or rhinitis. 09/28/17  Yes [name] M, PA-C  glucose blood (ACCU-CHEK AVIVA) test strip Use as instructed daily 04/14/17  Yes Newlin, Charlane Ferretti, MD  glucose blood (TRUE METRIX BLOOD GLUCOSE TEST) test strip Use as instructed 03/19/17  Yes Barrett, Evelene Croon, PA-C  ketotifen (ZADITOR) 0.025 % ophthalmic solution Place 1 drop into both eyes daily as needed (for dry eyes). 09/01/17  Yes [name], PA-C  Lancet Devices (ACCU-CHEK [place]) lancets Use as instructed daily. 04/14/17  Yes Charlott Rakes, MD  nitroGLYCERIN (NITROSTAT) 0.4 MG SL tablet Place 1 tablet (0.4 mg total) under the tongue every 5 (five) minutes x 3 doses as needed for chest pain. 03/19/17  Yes Barrett, Evelene Croon, PA-C  oxyCODONE-acetaminophen (PERCOCET/ROXICET) 5-325 MG tablet Take 1 tablet by mouth every 6 (six) hours as needed for severe pain. 09/20/17  Yes Gouru, Illene Silver, MD  allopurinol (ZYLOPRIM) 100 MG tablet Take 1 tablet (100 mg total) by mouth daily.  09/01/17   [name], PA-C  amLODipine (NORVASC) 10 MG tablet Take 1 tablet (10 mg total) by mouth daily. 09/24/17   [name], PA-C  aspirin EC 81 MG EC tablet Take 1 tablet (81 mg total) by mouth daily. 09/19/16   Strader, Fransisco Hertz, PA-C  atorvastatin (LIPITOR) 80 MG tablet Take 1 tablet (80 mg total) by mouth daily at 6 PM. 09/24/17   Burnette, Clearnce Sorrel, PA-C  carvedilol (COREG) 25 MG tablet TAKE 1 TABLET BY MOUTH 2 TIMES DAILY WITH A MEAL. 02/12/17   Funches, Josalyn, MD  Dulaglutide (TRULICITY) 9.56 LO/7.5IE SOPN Inject 0.75 mg into the skin once a week. 09/28/17   [name], PA-C  ferrous sulfate 325 (65 FE) MG tablet Take 1 tablet (325 mg total) by mouth 2 (two) times daily with a meal. 09/24/17   Burnette, Clearnce Sorrel, PA-C  Fluticasone-Salmeterol (ADVAIR DISKUS) 250-50 MCG/DOSE AEPB Inhale 1 puff into the lungs 2 (two) times  daily. 09/01/17   [name], PA-C  furosemide (LASIX) 40 MG tablet Take 1 tablet (40 mg total) by mouth 2 (two) times daily. 09/20/17   Nicholes Mango, MD  hydrALAZINE (APRESOLINE) 100 MG tablet Take 1 tablet (100 mg total) by mouth 2 (two) times daily. 09/28/17   [name], PA-C  hydrOXYzine (ATARAX/VISTARIL) 50 MG tablet Take 1 tablet (50 mg total) by mouth 3 (three) times daily as needed for itching. 09/24/17   [name], PA-C  insulin aspart (NOVOLOG) 100 UNIT/ML FlexPen Inject 10 Units into the skin 3 (three) times daily with meals. 09/20/17   Nicholes Mango, MD  Insulin Glargine (LANTUS SOLOSTAR) 100 UNIT/ML Solostar Pen Inject 48 Units into the skin 2 (two) times daily. 08/19/17   [name], MD  isosorbide mononitrate (IMDUR) 30 MG 24 hr tablet Take 3 tablets (90 mg total) by mouth daily. 09/21/17   Nicholes Mango, MD  loratadine (CLARITIN) 10 MG tablet Take 1 tablet (10 mg total) by mouth daily. 09/28/17   [name], PA-C  MITIGARE 0.6 MG CAPS Take 1 capsule by mouth daily. 10/01/17   [name], PA-C  montelukast (SINGULAIR) 10 MG tablet Take 1 tablet (10 mg total) by mouth at bedtime. 09/28/17   [name], PA-C  omeprazole (PRILOSEC) 20 MG capsule Take 1 capsule (20 mg total) by mouth daily. 09/24/17   [name], PA-C  pregabalin (LYRICA) 100 MG capsule Take 1 capsule (100 mg total) by mouth 3 (three) times daily. 09/01/17   [name], PA-C  ticagrelor (BRILINTA) 90 MG TABS tablet Take 1 tablet (90 mg total) by mouth 2 (two) times daily. 09/07/17   [name], PA-C  valACYclovir (VALTREX) 500 MG tablet Take 1 tablet (500 mg total) by mouth daily. 04/14/17   Charlott Rakes, MD  Vitamin D, Ergocalciferol, (DRISDOL) 50000 units CAPS capsule Take 1 capsule (50,000 Units total) by mouth every 30 (thirty) days. 09/01/17   [name], PA-C  gabapentin (NEURONTIN) 100 MG capsule Take 1 capsule (100 mg total) by  mouth 3 (three) times daily. 08/19/17 09/01/17  [name], MD    Allergies Shellfish allergy; Diazepam; and Morphine and related  Family History  Problem Relation Age of Onset  . Colon cancer Mother   . Heart attack Other   . Heart attack Maternal Grandmother   . Hypertension Sister   . Hypertension Brother   . Diabetes Paternal Grandmother   . Breast cancer Neg Hx  Social History Social History   Tobacco Use  . Smoking status: Former Smoker    Last attempt to quit: 10/25/1980    Years since quitting: 36.9  . Smokeless tobacco: Never Used  . Tobacco comment: quit smoking in 1982  Substance Use Topics  . Alcohol use: Yes    Comment: 3 times in last year  . Drug use: No    Types: "Crack" cocaine    Comment: 08/21/2016 "clean since 05/1998"    Review of Systems  Constitutional: No fever/chills Eyes: No visual changes. ENT: No sore throat. Cardiovascular: Denies chest pain. Respiratory:  shortness of breath. Gastrointestinal: No abdominal pain.  No nausea, no vomiting.  No diarrhea.  No constipation. Genitourinary: Negative for dysuria. Musculoskeletal: Negative for back pain. Skin: Negative for rash. Neurological: Negative for headaches, focal weakness  ____________________________________________   PHYSICAL EXAM:  VITAL SIGNS: ED Triage Vitals  Enc Vitals Group     BP 10/03/17 1421 (!) 150/59     Pulse Rate 10/03/17 1421 76     Resp 10/03/17 1421 18     Temp 10/03/17 1421 98.3 F (36.8 C)     Temp Source 10/03/17 1421 Oral     SpO2 10/03/17 1421 92 %     Weight 10/03/17 1428 (!) 302 lb (137 kg)     Height 10/03/17 1428 5' 8"  (1.727 m)     Head Circumference --      Peak Flow --      Pain Score 10/03/17 1428 0     Pain Loc --      Pain Edu? --      Excl. in [place]? --     Constitutional: Alert and oriented. Well appearing and in no acute distress. Eyes: Conjunctivae are normal.  Head: Atraumatic. Nose: No congestion/rhinnorhea. Mouth/Throat: Mucous  membranes are moist.  Oropharynx non-erythematous. Neck: No stridor.  Cardiovascular: Normal rate, regular rhythm. Grossly normal heart sounds.  Good peripheral circulation. Respiratory: Normal respiratory effort.  No retractions. Lungs CTAB. Gastrointestinal: Soft and nontender. No distention. No abdominal bruits. No CVA tenderness. Musculoskeletal: No lower extremity tenderness marked edema.   Neurologic:  Normal speech and language. No gross focal neurologic deficits are appreciated. Skin:  Skin is warm, dry and intact. No rash noted. Psychiatric: Mood and affect are normal. Speech and behavior are normal.  ____________________________________________   LABS (all labs ordered are listed, but only abnormal results are displayed)  Labs Reviewed  BASIC METABOLIC PANEL - Abnormal; Notable for the following components:      Result Value   CO2 21 (*)    Glucose, Bld 298 (*)    BUN 95 (*)    Creatinine, Ser 2.73 (*)    Calcium 8.7 (*)    GFR calc non Af Amer 18 (*)    GFR calc Af Amer 21 (*)    All other components within normal limits  CBC - Abnormal; Notable for the following components:   RBC 3.08 (*)    Hemoglobin 8.5 (*)    HCT 26.4 (*)    RDW 18.8 (*)    All other components within normal limits  HEPATIC FUNCTION PANEL - Abnormal; Notable for the following components:   AST 74 (*)    ALT 74 (*)    Alkaline Phosphatase 393 (*)    All other components within normal limits  BRAIN NATRIURETIC PEPTIDE - Abnormal; Notable for the following components:   B Natriuretic Peptide 147.0 (*)    All other components within  normal limits  URINALYSIS, COMPLETE (UACMP) WITH MICROSCOPIC - Abnormal; Notable for the following components:   Color, Urine YELLOW (*)    APPearance CLEAR (*)    Squamous Epithelial / LPF 0-5 (*)    All other components within normal limits  TROPONIN I  PROTIME-INR   ____________________________________________  EKG EKG read and interpreted by me shows  normal sinus rhythm rate of 70 normal axis no acute ST-T wave changes QTC is 500  ____________________________________________  RADIOLOGY  ED MD interpretation: chest x-[place] large heart and congestive failure  Official radiology report(s): Dg Chest Port 1 View  Result Date: 10/03/2017 CLINICAL DATA:  Shortness of breath with body swelling for 1 week. History of diabetes. EXAM: PORTABLE CHEST 1 VIEW COMPARISON:  09/15/2017 and 09/14/2017 radiographs. FINDINGS: 1436 hours. The heart size is at the upper limits of normal for portable AP technique. There are mild residual ground-glass opacities throughout the lungs, improved from the previous studies. There is no confluent airspace opacity, pleural effusion or pneumothorax. The bones appear unremarkable. Telemetry leads overlie the chest. IMPRESSION: Mild residual or recurrent ground-glass pulmonary opacities, improved from earlier this month and likely edema. No new findings. Electronically Signed   By: Richardean Sale M.D.   On: 10/03/2017 15:31    ____________________________________________   PROCEDURES  Procedure(s) performed:   Procedures  Critical Care performed:   ____________________________________________   INITIAL IMPRESSION / ASSESSMENT AND PLAN / ED COURSE   patient ambulated in the room without oxygen. Patient is not  on oxygen at home.patient desats to 85 after 20 feet.I have already given the patient enalapril and Lasix. However she has so much fluid onboard believe the best thing to do would be admit her for several days       ____________________________________________   FINAL CLINICAL IMPRESSION(S) / ED DIAGNOSES  Final diagnoses:  Anasarca  Dyspnea, unspecified type  Hypoxia     ED Discharge Orders    None       Note:  This document was prepared using Dragon voice recognition software and may include unintentional dictation errors.    Nena Polio, MD 10/03/17 1739

## 2017-10-04 ENCOUNTER — Ambulatory Visit: Payer: Medicaid Other | Admitting: Cardiovascular Disease

## 2017-10-04 DIAGNOSIS — I5033 Acute on chronic diastolic (congestive) heart failure: Secondary | ICD-10-CM

## 2017-10-04 DIAGNOSIS — G4730 Sleep apnea, unspecified: Secondary | ICD-10-CM

## 2017-10-04 LAB — BASIC METABOLIC PANEL
Anion gap: 11 (ref 5–15)
BUN: 98 mg/dL — ABNORMAL HIGH (ref 6–20)
CO2: 21 mmol/L — ABNORMAL LOW (ref 22–32)
Calcium: 8.4 mg/dL — ABNORMAL LOW (ref 8.9–10.3)
Chloride: 105 mmol/L (ref 101–111)
Creatinine, Ser: 2.86 mg/dL — ABNORMAL HIGH (ref 0.44–1.00)
GFR calc Af Amer: 20 mL/min — ABNORMAL LOW (ref >60)
GFR calc non Af Amer: 17 mL/min — ABNORMAL LOW (ref >60)
Glucose, Bld: 301 mg/dL — ABNORMAL HIGH (ref 65–99)
Potassium: 4.3 mmol/L (ref 3.5–5.1)
Sodium: 137 mmol/L (ref 135–145)

## 2017-10-04 LAB — GLUCOSE, CAPILLARY
Glucose-Capillary: 285 mg/dL — ABNORMAL HIGH (ref 65–99)
Glucose-Capillary: 279 mg/dL — ABNORMAL HIGH (ref 65–99)
Glucose-Capillary: 244 mg/dL — ABNORMAL HIGH (ref 65–99)
Glucose-Capillary: 258 mg/dL — ABNORMAL HIGH (ref 65–99)

## 2017-10-04 MED ORDER — HEPARIN SODIUM (PORCINE) 5000 UNIT/ML IJ SOLN
5000.0000 [IU] | Freq: Three times a day (TID) | INTRAMUSCULAR | Status: DC
  Administered 2017-10-04 – 2017-10-07 (×7): 5000 [IU] via SUBCUTANEOUS
  Filled 2017-10-04 (×13): qty 1

## 2017-10-04 MED ORDER — DEXTROSE 5 % IV SOLN
10.0000 mg/h | INTRAVENOUS | Status: AC
  Administered 2017-10-04: 5 mg/h via INTRAVENOUS
  Administered 2017-10-05: 10 mg/h via INTRAVENOUS
  Filled 2017-10-04: qty 25

## 2017-10-04 NOTE — Progress Notes (Deleted)
Cardiology Office Note Date:  10/04/2017   ID:  [name], DOB 18-Mar-1956, MRN [id number]  PCP:  [name], PA-C  Cardiologist:  Sherren Mocha, MD    No chief complaint on file.    History of Present Illness: [name] is a 62 y.o. female who presents for ***   Past Medical History:  Diagnosis Date  . Aortic stenosis    Echo 8/18: mean 13, peak 28, LVOT/AV mean velocity 0.51  . Asthma    As a child   . Bronchitis   . CAD (coronary artery disease)    a. 09/2016: 50% Ost 1st Mrg stenosis, 50% 2nd Mrg stenosis, 20% Mid-Cx, 95% Prox LAD, 40% mid-LAD, and 10% dist-LAD stenosis. Staged PCI with DES to Prox-LAD.   [name] Chronic combined systolic and diastolic CHF (congestive heart failure) ([place]) 2011   echo 2/18: EF 55-60, normal wall motion, grade 2 diastolic dysfunction, trivial AI // echo 3/18: Septal and apical HK, EF 45-50, normal wall motion, trivial AI, mild LAE, PASP 38 // echo 8/18: EF 60-65, normal wall motion, grade 1 diastolic dysfunction, calcified aortic valve leaflets, mild aortic stenosis (mean 13, peak 28, LVOT/AV mean velocity 0.51), mild AI, moderate MAC, mild LAE, trivial TR   . Chronic kidney disease   . Complication of anesthesia   . Diabetes mellitus Dx 1989  . Hepatitis C Dx 2013  . Hypertension Dx 1989  . Obesity   . Obstructive sleep apnea   . Pancreatitis 2013  . Refusal of blood transfusions as patient is Jehovah's Witness   . Tendinitis   . Ulcer 2010    Past Surgical History:  Procedure Laterality Date  . APPLICATION OF WOUND VAC Left 08/14/2017   Procedure: APPLICATION OF WOUND VAC Exchange;  Surgeon: Robert Bellow, MD;  Location: [facility];  Service: General;  Laterality: Left;  . CHOLECYSTECTOMY    . CORONARY STENT INTERVENTION N/A 09/18/2016   Procedure: Coronary Stent Intervention;  Surgeon: Troy Sine, MD;  Location: [place] [facility];  Service: Cardiovascular;  Laterality: N/A;  . DRESSING CHANGE UNDER ANESTHESIA Left  08/15/2017   Procedure: exploration of wound for bleeding;  Surgeon: Robert Bellow, MD;  Location: [facility];  Service: General;  Laterality: Left;  . EYE SURGERY    . INCISION AND DRAINAGE ABSCESS Left 08/12/2017   Procedure: INCISION AND DRAINAGE ABSCESS;  Surgeon: Robert Bellow, MD;  Location: [facility];  Service: General;  Laterality: Left;  . KNEE ARTHROSCOPY    . LEFT HEART CATH N/A 09/18/2016   Procedure: Left Heart Cath;  Surgeon: Troy Sine, MD;  Location: [place] [facility];  Service: Cardiovascular;  Laterality: N/A;  . LEFT HEART CATH AND CORONARY ANGIOGRAPHY N/A 09/16/2016   Procedure: Left Heart Cath and Coronary Angiography;  Surgeon: Burnell Blanks, MD;  Location: [place] [facility];  Service: Cardiovascular;  Laterality: N/A;  . LEFT HEART CATH AND CORONARY ANGIOGRAPHY N/A 04/29/2017   Procedure: LEFT HEART CATH AND CORONARY ANGIOGRAPHY;  Surgeon: Nelva Bush, MD;  Location: [place] [facility];  Service: Cardiovascular;  Laterality: N/A;  . TUBAL LIGATION    . TUBAL LIGATION      No current facility-administered medications for this visit.    No current outpatient medications on file.   Facility-Administered Medications Ordered in Other Visits  Medication Dose Route Frequency Provider Last Rate Last Dose  . acetaminophen (TYLENOL) tablet 650 mg  650 mg Oral Q6H PRN  Epifanio Lesches, MD       Or  . acetaminophen (TYLENOL) suppository 650 mg  650 mg Rectal Q6H PRN Epifanio Lesches, MD      . albuterol (PROVENTIL) (2.5 MG/3ML) 0.083% nebulizer solution 2.5 mg  2.5 mg Nebulization Q6H PRN Epifanio Lesches, MD   2.5 mg at 10/04/17 0650  . allopurinol (ZYLOPRIM) tablet 100 mg  100 mg Oral Daily Epifanio Lesches, MD      . amLODipine (NORVASC) tablet 10 mg  10 mg Oral Daily Epifanio Lesches, MD      . aspirin EC tablet 81 mg  81 mg Oral Daily Epifanio Lesches, MD      . atorvastatin (LIPITOR) tablet 80 mg  80 mg Oral q1800 Epifanio Lesches, MD   80 mg at 10/03/17 2208  . bisacodyl (DULCOLAX) EC tablet 5 mg  5 mg Oral Daily PRN Epifanio Lesches, MD      . carvedilol (COREG) tablet 25 mg  25 mg Oral BID WC Epifanio Lesches, MD   25 mg at 10/03/17 2209  . docusate sodium (COLACE) capsule 100 mg  100 mg Oral BID PRN Epifanio Lesches, MD      . docusate sodium (COLACE) capsule 100 mg  100 mg Oral BID Epifanio Lesches, MD      . enoxaparin (LOVENOX) injection 40 mg  40 mg Subcutaneous BID Epifanio Lesches, MD   40 mg at 10/03/17 2149  . insulin aspart (novoLOG) injection 0-9 Units  0-9 Units Subcutaneous TID WC Epifanio Lesches, MD      . insulin glargine (LANTUS) injection 48 Units  48 Units Subcutaneous BID Epifanio Lesches, MD   48 Units at 10/03/17 2148  . isosorbide mononitrate (IMDUR) 24 hr tablet 90 mg  90 mg Oral Daily Epifanio Lesches, MD      . MEDLINE mouth rinse  15 mL Mouth Rinse BID Epifanio Lesches, MD   15 mL at 10/03/17 2213  . mometasone-formoterol (DULERA) 200-5 MCG/ACT inhaler 2 puff  2 puff Inhalation BID Epifanio Lesches, MD   2 puff at 10/03/17 2148  . montelukast (SINGULAIR) tablet 10 mg  10 mg Oral QHS Epifanio Lesches, MD   10 mg at 10/03/17 2148  . nitroGLYCERIN (NITROSTAT) SL tablet 0.4 mg  0.4 mg Sublingual Q5 Min x 3 PRN Epifanio Lesches, MD      . ondansetron (ZOFRAN) tablet 4 mg  4 mg Oral Q6H PRN Epifanio Lesches, MD       Or  . ondansetron (ZOFRAN) injection 4 mg  4 mg Intravenous Q6H PRN Epifanio Lesches, MD      . pantoprazole (PROTONIX) EC tablet 40 mg  40 mg Oral QAC breakfast Epifanio Lesches, MD      . ticagrelor (BRILINTA) tablet 90 mg  90 mg Oral BID Epifanio Lesches, MD   90 mg at 10/03/17 2148  . traZODone (DESYREL) tablet 25 mg  25 mg Oral QHS PRN Epifanio Lesches, MD      . Derrill Memo ON 10/05/2017] Vitamin D (Ergocalciferol) (DRISDOL) capsule 50,000 Units  50,000 Units Oral Q30 days Epifanio Lesches, MD         Allergies:   Shellfish allergy; Diazepam; and Morphine and related   Social History:  The patient  reports that she quit smoking about 36 years ago. She has never used smokeless tobacco. She reports that she drinks alcohol. She reports that she does not use drugs.   Family History:  The patient's *** family history includes Colon cancer in her mother; Diabetes  in her paternal grandmother; Heart attack in her maternal grandmother and other; Hypertension in her brother and sister.    ROS:  Please see the history of present illness.  Otherwise, review of systems is positive for ***.  All other systems are reviewed and negative.    PHYSICAL EXAM: VS:  There were no vitals taken for this visit. , BMI There is no height or weight on file to calculate BMI. GEN: Well nourished, well developed, in no acute distress HEENT: normal Neck: no JVD, no masses. No carotid bruits*** Cardiac: ***RRR without murmur or gallop                Respiratory:  clear to auscultation bilaterally, normal work of breathing GI: soft, nontender, nondistended, + BS MS: no deformity or atrophy Ext: no pretibial edema, ***pedal pulses 2+= bilaterally Skin: warm and dry, no rash Neuro:  Strength and sensation are intact Psych: euthymic mood, full affect  EKG:  EKG {ACTION; IS/IS [id number]} ordered today. The ekg ordered today shows ***  Recent Labs: 12/13/2016: Magnesium 2.2 08/05/2017: NT-Pro BNP 401 08/11/2017: TSH 1.444 10/03/2017: ALT 74; B Natriuretic Peptide 147.0; Hemoglobin 8.5; Platelets 205 10/04/2017: BUN 98; Creatinine, Ser 2.86; Potassium 4.3; Sodium 137   Lipid Panel     Component Value Date/Time   CHOL 122 09/13/2016 1245   TRIG 168 (H) 09/13/2016 1245   HDL 36 (L) 09/13/2016 1245   CHOLHDL 3.4 09/13/2016 1245   VLDL 34 09/13/2016 1245   LDLCALC 52 09/13/2016 1245      Wt Readings from Last 3 Encounters:  10/04/17 (!) 318 lb 9.6 oz (144.5 kg)  09/30/17 (!) 312 lb 8 oz (141.7 kg)   09/28/17 (!) 313 lb (142 kg)     Cardiac Studies Reviewed: ***  ASSESSMENT AND PLAN: 1.  ***  Current medicines are reviewed with the patient today.  The patient {ACTIONS; HAS/DOES NOT HAVE:19233} concerns regarding medicines.  Labs/ tests ordered today include: *** No orders of the defined types were placed in this encounter.   Disposition:   FU ***  Signed, Sherren Mocha, MD  10/04/2017 8:00 AM    [facility] [place], [place], [place]  [address] Phone: [phone]; Fax: [phone]

## 2017-10-04 NOTE — Progress Notes (Signed)
[facility]  ROUNDING NOTE   Subjective:  Patient well-known to [place] from prior admission. At that time she had significant peripheral edema and required Lasix drip. She now presents with increasing shortness of breath, weight gain, and edema. Patient has been restarted on IV Lasix at the moment. Significant renal function deterioration now as well.   Objective:  Vital signs in last 24 hours:  Temp:  [98.2 F (36.8 C)-99.2 F (37.3 C)] 98.3 F (36.8 C) (03/25 0801) Pulse Rate:  [63-77] 68 (03/25 0801) Resp:  [12-22] 18 (03/25 0801) BP: (116-160)/(54-93) 116/65 (03/25 0801) SpO2:  [92 %-99 %] 94 % (03/25 0801) Weight:  [137 kg (302 lb)-144.9 kg (319 lb 6.4 oz)] 144.5 kg (318 lb 9.6 oz) (03/25 0401)  Weight change:  Filed Weights   10/03/17 1428 10/03/17 2028 10/04/17 0401  Weight: (!) 137 kg (302 lb) (!) 144.9 kg (319 lb 6.4 oz) (!) 144.5 kg (318 lb 9.6 oz)    Intake/Output: I/O last 3 completed shifts: In: 72 [I.V.:72] Out: 600 [Urine:600]   Intake/Output this shift:  Total I/O In: 360 [P.O.:360] Out: 1150 [Urine:1150]  Physical Exam: General: No acute distress  Head: Normocephalic, atraumatic. Moist oral mucosal membranes  Eyes: Anicteric  Neck: Supple, trachea midline  Lungs:  Basilar rales and rhonchi, normal effort  Heart: S1S2 no rubs  Abdomen:  Soft, nontender, bowel sounds present  Extremities: 2+ peripheral edema.  Neurologic: Awake, alert, following commands  Skin: No lesions  Access:     Basic Metabolic Panel: Recent Labs  Lab 10/03/17 1500 10/04/17 0415  NA 138 137  K 4.5 4.3  CL 105 105  CO2 21* 21*  GLUCOSE 298* 301*  BUN 95* 98*  CREATININE 2.73* 2.86*  CALCIUM 8.7* 8.4*    Liver Function Tests: Recent Labs  Lab 10/03/17 1628  AST 74*  ALT 74*  ALKPHOS 393*  BILITOT 0.8  PROT 7.3  ALBUMIN 3.5   No results for input(s): LIPASE, AMYLASE in the last 168 hours. No results for input(s): AMMONIA in the last 168  hours.  CBC: Recent Labs  Lab 10/03/17 1500  WBC 6.7  HGB 8.5*  HCT 26.4*  MCV 85.7  PLT 205    Cardiac Enzymes: Recent Labs  Lab 10/03/17 1500  TROPONINI <0.03    BNP: Invalid input(s): POCBNP  CBG: Recent Labs  Lab 09/30/17 1208 10/03/17 2055 10/04/17 0803 10/04/17 1151  GLUCAP 304* 273* 285* 279*    Microbiology: Results for orders placed or performed during the hospital encounter of 09/14/17  MRSA PCR Screening     Status: None   Collection Time: 09/14/17  4:40 PM  Result Value Ref Range Status   MRSA by PCR NEGATIVE NEGATIVE Final    Comment:        The GeneXpert MRSA Assay (FDA approved for NASAL specimens only), is one component of a comprehensive MRSA colonization surveillance program. It is not intended to diagnose MRSA infection nor to guide or monitor treatment for MRSA infections. Performed at [facility], [address] [place], [place] [address]   CULTURE, BLOOD (ROUTINE X 2) w Reflex to ID Panel     Status: None   Collection Time: 09/14/17 11:11 PM  Result Value Ref Range Status   Specimen Description BLOOD LT [facility]  Final   Special Requests   Final    BOTTLES DRAWN AEROBIC AND ANAEROBIC Blood Culture adequate volume   Culture   Final    NO GROWTH 5  DAYS Performed at [facility], [address] [place], [place] [address]    Report Status 09/19/2017 FINAL  Final  CULTURE, BLOOD (ROUTINE X 2) w Reflex to ID Panel     Status: None   Collection Time: 09/14/17 11:37 PM  Result Value Ref Range Status   Specimen Description BLOOD RT ARM  Final   Special Requests   Final    BOTTLES DRAWN AEROBIC AND ANAEROBIC Blood Culture results may not be optimal due to an inadequate volume of blood received in culture bottles   Culture   Final    NO GROWTH 5 DAYS Performed at [facility], [address] [place], [place] [address]    Report Status 09/19/2017 FINAL  Final  Urine Culture     Status: None   Collection  Time: 09/15/17  3:18 AM  Result Value Ref Range Status   Specimen Description   Final    URINE, RANDOM Performed at [facility], [address], [place], [place] [address]    Special Requests   Final    NONE Performed at [facility], [address] [place], [place] [address]    Culture   Final    NO GROWTH Performed at [facility], [place] [address], [place], [place] [address]    Report Status 09/16/2017 FINAL  Final    Coagulation Studies: Recent Labs    10/03/17 1500  LABPROT 13.2  INR 1.01    Urinalysis: Recent Labs    10/03/17 1628  COLORURINE YELLOW*  LABSPEC 1.008  PHURINE 5.0  GLUCOSEU NEGATIVE  HGBUR NEGATIVE  BILIRUBINUR NEGATIVE  KETONESUR NEGATIVE  PROTEINUR NEGATIVE  NITRITE NEGATIVE  LEUKOCYTESUR NEGATIVE      Imaging: Dg Chest Port 1 View  Result Date: 10/03/2017 CLINICAL DATA:  Shortness of breath with body swelling for 1 week. History of diabetes. EXAM: PORTABLE CHEST 1 VIEW COMPARISON:  09/15/2017 and 09/14/2017 radiographs. FINDINGS: 1436 hours. The heart size is at the upper limits of normal for portable AP technique. There are mild residual ground-glass opacities throughout the lungs, improved from the previous studies. There is no confluent airspace opacity, pleural effusion or pneumothorax. The bones appear unremarkable. Telemetry leads overlie the chest. IMPRESSION: Mild residual or recurrent ground-glass pulmonary opacities, improved from earlier this month and likely edema. No new findings. Electronically Signed   By: Richardean Sale M.D.   On: 10/03/2017 15:31     Medications:   . furosemide (LASIX) infusion 5 mg/hr (10/04/17 0852)   . allopurinol  100 mg Oral Daily  . amLODipine  10 mg Oral Daily  . aspirin EC  81 mg Oral Daily  . atorvastatin  80 mg Oral q1800  . carvedilol  25 mg Oral BID WC  . docusate sodium  100 mg Oral BID  . heparin injection (subcutaneous)  5,000 Units Subcutaneous Q8H  . insulin  aspart  0-9 Units Subcutaneous TID WC  . insulin glargine  48 Units Subcutaneous BID  . isosorbide mononitrate  90 mg Oral Daily  . mouth rinse  15 mL Mouth Rinse BID  . mometasone-formoterol  2 puff Inhalation BID  . montelukast  10 mg Oral QHS  . pantoprazole  40 mg Oral QAC breakfast  . ticagrelor  90 mg Oral BID  . [START ON 10/05/2017] Vitamin D (Ergocalciferol)  50,000 Units Oral Q30 days   acetaminophen **OR** acetaminophen, albuterol, bisacodyl, docusate sodium, nitroGLYCERIN, ondansetron **OR** ondansetron (ZOFRAN) IV, traZODone  Assessment/ Plan:  62 y.o. female African-American femalewith chronic kidney disease stage III followed by Dr. Arty Baumgartner, [facility], hypertension, coronary artery disease, congestive heart failure with diastolic dysfunction, diabetes mellitus type II, hepatitis C chronic, peptic ulcer disease, obstructive sleep apnea  1.  Acute renal failure on chronic kidney disease stage III baseline creatinine 1.81/GFR 35 (Aug 05, 2017) 2.  Peripheral edema. 3.  Hypertension. 4.  Pulmonary edema  Plan:  Patient continues to have significant renal dysfunction.  BUN currently 95 with a creatinine of 2.7 which are similar to values when the patient was discharged on September 20, 2017.  She was discharged home on Lasix 40 mill grams p.o. twice daily but continues to have significant shortness of breath and lower extremity edema.  She has been transitioned back to Lasix drip.  I did advise the patient that we may need to consider renal replacement therapy if renal function continues to deteriorate.  She verbalized understanding of this.  Further plan based upon patient progress.   LOS: 1 Maryanna Stuber 3/25/20191:25 PM

## 2017-10-04 NOTE — Progress Notes (Signed)
[facility] [place] NAME: [name]    MR#:  [id number]  DATE OF BIRTH:  07/18/1955  SUBJECTIVE:  CHIEF COMPLAINT:   Chief Complaint  Patient presents with  . Shortness of Breath  . Facial Swelling   Better shortness of breath and cough, on oxygen 2 L. REVIEW OF SYSTEMS:  Review of Systems  Constitutional: Negative for chills, fever and malaise/fatigue.  HENT: Negative for sore throat.   Eyes: Negative for blurred vision and double vision.  Respiratory: Positive for cough and shortness of breath. Negative for hemoptysis, sputum production, wheezing and stridor.   Cardiovascular: Negative for chest pain, palpitations, orthopnea and leg swelling.  Gastrointestinal: Negative for abdominal pain, blood in stool, diarrhea, melena, nausea and vomiting.  Genitourinary: Negative for dysuria, flank pain and hematuria.  Musculoskeletal: Negative for back pain and joint pain.  Skin: Negative for rash.  Neurological: Negative for dizziness, sensory change, focal weakness, seizures, loss of consciousness, weakness and headaches.  Endo/Heme/Allergies: Negative for polydipsia.  Psychiatric/Behavioral: Negative for depression. The patient is not nervous/anxious.     DRUG ALLERGIES:   Allergies  Allergen Reactions  . Shellfish Allergy Anaphylaxis and Swelling  . Diazepam Other (See Comments)    "felt like out of body experience"  . Morphine And Related Itching   VITALS:  Blood pressure (!) 136/55, pulse 69, temperature 98.3 F (36.8 C), temperature source Oral, resp. rate 18, height 5\' 8"  (1.727 m), weight (!) 318 lb 9.6 oz (144.5 kg), SpO2 100 %. PHYSICAL EXAMINATION:  Physical Exam  Constitutional: She is oriented to person, place, and time and well-developed, well-nourished, and in no distress.  Morbid obesity.  HENT:  Head: Normocephalic.  Mouth/Throat: Oropharynx is clear and moist.  Eyes: Pupils are equal, round, and reactive to  light. Conjunctivae and EOM are normal. No scleral icterus.  Neck: Normal range of motion. Neck supple. No JVD present. No tracheal deviation present.  Cardiovascular: Normal rate, regular rhythm and normal heart sounds. Exam reveals no gallop.  No murmur heard. Pulmonary/Chest: Effort normal. No respiratory distress. She has no wheezes. She has rales.  Abdominal: Soft. Bowel sounds are normal. She exhibits no distension. There is no tenderness. There is no rebound.  Musculoskeletal: Normal range of motion. She exhibits no edema or tenderness.  Neurological: She is alert and oriented to person, place, and time. No cranial nerve deficit.  Skin: No rash noted. No erythema.  Psychiatric: Affect normal.   LABORATORY PANEL:  Female CBC Recent Labs  Lab 10/03/17 1500  WBC 6.7  HGB 8.5*  HCT 26.4*  PLT 205   ------------------------------------------------------------------------------------------------------------------ Chemistries  Recent Labs  Lab 10/03/17 1628 10/04/17 0415  NA  --  137  K  --  4.3  CL  --  105  CO2  --  21*  GLUCOSE  --  301*  BUN  --  98*  CREATININE  --  2.86*  CALCIUM  --  8.4*  AST 74*  --   ALT 74*  --   ALKPHOS 393*  --   BILITOT 0.8  --    RADIOLOGY:  No results found. ASSESSMENT AND PLAN:   62 year old female patient with shortness of breath, weight gain, has history of combined heart failure, chest x-Browe showed CHF and she became hypoxic upon walking to 20 feet and sats dropped to 85%.  1 Acute respiratory failure with hypoxia due to acute on chronic diastolic heart failure LV EF: 65% -  70%,  The patient recently discharged from hospital on March 11 comes back with weight gain, pedal edema, shortness of breath  Continue IV Lasix drip, CHF protocol.  * Acute renal failure on chronic kidney disease stage III baseline creatinine 1.81/GFR 35  Follow-up BMP while on Lasix.  The patient may need renal replacement therapy if renal function get  worse per Dr. Holley Raring.  2 History of CAD, patient is on aspirin, Brilinta, beta-blockers.  Statins  3.  Morbid obesity with obstructive sleep apnea: CPAP at night.  #5 history of COPD: No wheezing.  Continue albuterol inhaler as needed.  6.  History of diabetes mellitus type 2: Resumed home dose  Lantus and continue sliding scale.  #.7 history of diabetic neuropathy: Continue Lyrica.   All the records are reviewed and case discussed with Care Management/Social Worker. Management plans discussed with the patient, family and they are in agreement.  CODE STATUS: Full Code  TOTAL TIME TAKING CARE OF THIS PATIENT: 36 minutes.   More than 50% of the time was spent in counseling/coordination of care: YES  POSSIBLE D/C IN 2 DAYS, DEPENDING ON CLINICAL CONDITION.   Demetrios Loll M.D on 10/04/2017 at 6:33 PM  Between 7am to 6pm - Pager - [phone]  After 6pm go to [email or web address] - [facility]

## 2017-10-04 NOTE — Plan of Care (Signed)
Pt admitted last night with CHF. Lasix gtt infused for a few hours and orders in to stop. 2L O2 acute, pt did complain of shortness of breath this morning, PRN breathing treatment given with relief. Sinus rhythm on box 25. Up with one assist to [facility]. Wound to left inner thigh, verified by Levester Fresh, RN. Cleansed and dressed, wound care consult placed. BM last night 10/03/17.

## 2017-10-04 NOTE — Progress Notes (Signed)
Inpatient Diabetes Program Recommendations  AACE/ADA: New Consensus Statement on Inpatient Glycemic Control (2015)  Target Ranges:  Prepandial:   less than 140 mg/dL      Peak postprandial:   less than 180 mg/dL (1-2 hours)      Critically ill patients:  140 - 180 mg/dL   Results for HALSEY, PERSAUD (MRN [id number]) as of 10/04/2017 11:50  Ref. Range 10/03/2017 20:55 10/04/2017 08:03  Glucose-Capillary Latest Ref Range: 65 - 99 mg/dL 273 (H) 285 (H)  Results for YEIMI, DEBNAM (MRN [id number]) as of 10/04/2017 11:50  Ref. Range 08/11/2017 14:35  Hemoglobin A1C Latest Ref Range: 4.8 - 5.6 % 9.1 (H)   Review of Glycemic Control  Diabetes history: DM2 Outpatient Diabetes medications: Lantus 48 units BID, Novolog 10 units TID with meals, Trulicity 9.23 mg weekly Current orders for Inpatient glycemic control: Lantus 48 units BID, Novolog 0-9 units TID with meals  Inpatient Diabetes Program Recommendations: Correction (SSI): Please consider ordering Novolog 0-5 units QHS for bedtime correction. Insulin - Meal Coverage: If post prandial glucose is consistently greater than 180 mg/dl, may want to consider ordering Novolog 5 units TID with meals for meal coverage if patient eats at least 50% of meals.  Thanks, Barnie Alderman, RN, MSN, CDE Diabetes Coordinator Inpatient Diabetes Program [phone] (Team Pager from 8am to 5pm)

## 2017-10-04 NOTE — Consult Note (Signed)
Cardiology Consult    Patient ID: [name]; [id number]; Mar 22, 1956   Admit date: 10/03/2017 Date of Consult: 10/04/2017  Primary Care Provider: [name], PA-C Primary Cardiologist: Sherren Mocha, MD    Patient Profile    [name] is a 62 y.o. female with past medical history of chronic diastolic CHF, CAD (s/p DES to Prox-LAD in 09/2016), moderate AS, HTN, HLD, morbid obesity, Stage 4 CKD, and OSA who is being seen today for the evaluation of acute CHF at the request of Dr. Vianne Bulls.   History of Present Illness    [name] was recently admitted earlier this month for acute hypoxic respiratory failure in the setting of a CHF exacerbation and uncontrolled HTN . She initially required IV NTG for BP control but was transitioned to PO medications throughout her hospitalization. Nephrology followed as well for diuresis and she diuresed well with a IV Lasix drip (at 22m/hr). Weight declined over 19 lbs during admission and discharge weight was 301 lbs (creatinine 2.50 at discharge). Was switched to PO Lasix 496mBID at the time of discharge.   She followed up with TiDarylene PriceFNP on 09/30/2017 and weight had trended up to 312 lbs. Changes were not made to her medication regimen and were deferred to Nephrology. Followed up with Nephrology later that day and was transitioned from Lasix to Torsemide per her report. Unsure of the exact dosing but says she did not start this until 3/23.  [facility] for worsening dyspnea on exertion and orthopnea. Has experienced associated lower extremity edema and facial swelling. Reports her weight has continued to trend upwards on her home scales and has been at 318 - 319 lbs over the past several days. Was using her inhalers with no improvement in her breathing. Denies any recent chest pain or palpitations. Says she did not feel like she was urinating much with PTA Lasix. Unsure about urinary output with Torsemide as  she only took this twice. Says she has been compliant with a low-sodium diet.   Initial labs show WBC of 6.7, Hgb 8.5, platelets 205, Na+ 138, K+ 4.5, and creatinine 2.73. BNP 147. CXR shows mild residual or recurrent ground-glass pulmonary opacities, improved from earlier this month and likely edema. EKG shows NSR, HR 70, with no acute ST or T-wave changes when compared to prior tracings.   She was started on IV Lasix at 10 mg/hr on admission and has not noticed any changes in her respiratory status. Repeat BMET this AM shows creatinine has trended up to 2.86.    Past Medical History:  Diagnosis Date  . Aortic stenosis    Echo 8/18: mean 13, peak 28, LVOT/AV mean velocity 0.51  . Asthma    As a child   . Bronchitis   . CAD (coronary artery disease)    a. 09/2016: 50% Ost 1st Mrg stenosis, 50% 2nd Mrg stenosis, 20% Mid-Cx, 95% Prox LAD, 40% mid-LAD, and 10% dist-LAD stenosis. Staged PCI with DES to Prox-LAD.   . Marland Kitchenhronic combined systolic and diastolic CHF (congestive heart failure) ([facility]2011   echo 2/18: EF 55-60, normal wall motion, grade 2 diastolic dysfunction, trivial AI // echo 3/18: Septal and apical HK, EF 45-50, normal wall motion, trivial AI, mild LAE, PASP 38 // echo 8/18: EF 60-65, normal wall motion, grade 1 diastolic dysfunction, calcified aortic valve leaflets, mild aortic stenosis (mean 13, peak 28, LVOT/AV mean velocity 0.51), mild AI, moderate MAC, mild  LAE, trivial TR   . Chronic kidney disease   . Complication of anesthesia   . Diabetes mellitus Dx 1989  . Hepatitis C Dx 2013  . Hypertension Dx 1989  . Obesity   . Obstructive sleep apnea   . Pancreatitis 2013  . Refusal of blood transfusions as patient is Jehovah's Witness   . Tendinitis   . Ulcer 2010    Past Surgical History:  Procedure Laterality Date  . APPLICATION OF WOUND VAC Left 08/14/2017   Procedure: APPLICATION OF WOUND VAC Exchange;  Surgeon: Robert Bellow, MD;  Location: [facility];  Service:  General;  Laterality: Left;  . CHOLECYSTECTOMY    . CORONARY STENT INTERVENTION N/A 09/18/2016   Procedure: Coronary Stent Intervention;  Surgeon: Troy Sine, MD;  Location: [place] [facility];  Service: Cardiovascular;  Laterality: N/A;  . DRESSING CHANGE UNDER ANESTHESIA Left 08/15/2017   Procedure: exploration of wound for bleeding;  Surgeon: Robert Bellow, MD;  Location: [facility];  Service: General;  Laterality: Left;  . EYE SURGERY    . INCISION AND DRAINAGE ABSCESS Left 08/12/2017   Procedure: INCISION AND DRAINAGE ABSCESS;  Surgeon: Robert Bellow, MD;  Location: [facility];  Service: General;  Laterality: Left;  . KNEE ARTHROSCOPY    . LEFT HEART CATH N/A 09/18/2016   Procedure: Left Heart Cath;  Surgeon: Troy Sine, MD;  Location: [place] [facility];  Service: Cardiovascular;  Laterality: N/A;  . LEFT HEART CATH AND CORONARY ANGIOGRAPHY N/A 09/16/2016   Procedure: Left Heart Cath and Coronary Angiography;  Surgeon: Burnell Blanks, MD;  Location: [place] [facility];  Service: Cardiovascular;  Laterality: N/A;  . LEFT HEART CATH AND CORONARY ANGIOGRAPHY N/A 04/29/2017   Procedure: LEFT HEART CATH AND CORONARY ANGIOGRAPHY;  Surgeon: Nelva Bush, MD;  Location: [place] [facility];  Service: Cardiovascular;  Laterality: N/A;  . TUBAL LIGATION    . TUBAL LIGATION       Home Medications:  Prior to Admission medications   Medication Sig Start Date End Date Taking? Authorizing Provider  albuterol (PROVENTIL) (2.5 MG/3ML) 0.083% nebulizer solution Take 3 mLs (2.5 mg total) by nebulization every 6 (six) hours as needed for wheezing or shortness of breath. 09/01/17  Yes [name], PA-C  albuterol (VENTOLIN HFA) 108 (90 Base) MCG/ACT inhaler Inhale 2 puffs into the lungs every 4 (four) hours as needed for wheezing or shortness of breath. 09/01/17  Yes [name], PA-C  Blood Glucose Monitoring Suppl (ACCU-CHEK AVIVA) device Use as instructed daily. 04/14/17   Yes Charlott Rakes, MD  docusate sodium (COLACE) 100 MG capsule Take 1 capsule (100 mg total) by mouth 2 (two) times daily as needed for mild constipation. 09/20/17  Yes Gouru, Illene Silver, MD  fluticasone (FLONASE) 50 MCG/ACT nasal spray Place 2 sprays into both nostrils daily as needed for allergies or rhinitis. 09/28/17  Yes [name] M, PA-C  glucose blood (ACCU-CHEK AVIVA) test strip Use as instructed daily 04/14/17  Yes Newlin, Charlane Ferretti, MD  glucose blood (TRUE METRIX BLOOD GLUCOSE TEST) test strip Use as instructed 03/19/17  Yes Barrett, Evelene Croon, PA-C  ketotifen (ZADITOR) 0.025 % ophthalmic solution Place 1 drop into both eyes daily as needed (for dry eyes). 09/01/17  Yes [name], PA-C  Lancet Devices (ACCU-CHEK [place]) lancets Use as instructed daily. 04/14/17  Yes Charlott Rakes, MD  nitroGLYCERIN (NITROSTAT) 0.4 MG SL tablet Place 1 tablet (0.4 mg total) under the tongue every  5 (five) minutes x 3 doses as needed for chest pain. 03/19/17  Yes Barrett, Evelene Croon, PA-C  oxyCODONE-acetaminophen (PERCOCET/ROXICET) 5-325 MG tablet Take 1 tablet by mouth every 6 (six) hours as needed for severe pain. 09/20/17  Yes Gouru, Illene Silver, MD  allopurinol (ZYLOPRIM) 100 MG tablet Take 1 tablet (100 mg total) by mouth daily. 09/01/17   [name], PA-C  amLODipine (NORVASC) 10 MG tablet Take 1 tablet (10 mg total) by mouth daily. 09/24/17   [name], PA-C  aspirin EC 81 MG EC tablet Take 1 tablet (81 mg total) by mouth daily. 09/19/16   Sheehan Stacey, Fransisco Hertz, PA-C  atorvastatin (LIPITOR) 80 MG tablet Take 1 tablet (80 mg total) by mouth daily at 6 PM. 09/24/17   Burnette, Clearnce Sorrel, PA-C  carvedilol (COREG) 25 MG tablet TAKE 1 TABLET BY MOUTH 2 TIMES DAILY WITH A MEAL. 02/12/17   Funches, Josalyn, MD  Dulaglutide (TRULICITY) 6.06 TK/1.6WF SOPN Inject 0.75 mg into the skin once a week. 09/28/17   [name], PA-C  ferrous sulfate 325 (65 FE) MG tablet Take 1 tablet (325 mg  total) by mouth 2 (two) times daily with a meal. 09/24/17   Burnette, Clearnce Sorrel, PA-C  Fluticasone-Salmeterol (ADVAIR DISKUS) 250-50 MCG/DOSE AEPB Inhale 1 puff into the lungs 2 (two) times daily. 09/01/17   [name], PA-C  furosemide (LASIX) 40 MG tablet Take 1 tablet (40 mg total) by mouth 2 (two) times daily. 09/20/17   Nicholes Mango, MD  hydrALAZINE (APRESOLINE) 100 MG tablet Take 1 tablet (100 mg total) by mouth 2 (two) times daily. 09/28/17   [name], PA-C  hydrOXYzine (ATARAX/VISTARIL) 50 MG tablet Take 1 tablet (50 mg total) by mouth 3 (three) times daily as needed for itching. 09/24/17   [name], PA-C  insulin aspart (NOVOLOG) 100 UNIT/ML FlexPen Inject 10 Units into the skin 3 (three) times daily with meals. 09/20/17   Nicholes Mango, MD  Insulin Glargine (LANTUS SOLOSTAR) 100 UNIT/ML Solostar Pen Inject 48 Units into the skin 2 (two) times daily. 08/19/17   [name], MD  isosorbide mononitrate (IMDUR) 30 MG 24 hr tablet Take 3 tablets (90 mg total) by mouth daily. 09/21/17   Nicholes Mango, MD  loratadine (CLARITIN) 10 MG tablet Take 1 tablet (10 mg total) by mouth daily. 09/28/17   [name], PA-C  MITIGARE 0.6 MG CAPS Take 1 capsule by mouth daily. 10/01/17   [name], PA-C  montelukast (SINGULAIR) 10 MG tablet Take 1 tablet (10 mg total) by mouth at bedtime. 09/28/17   [name], PA-C  omeprazole (PRILOSEC) 20 MG capsule Take 1 capsule (20 mg total) by mouth daily. 09/24/17   [name], PA-C  pregabalin (LYRICA) 100 MG capsule Take 1 capsule (100 mg total) by mouth 3 (three) times daily. 09/01/17   [name], PA-C  ticagrelor (BRILINTA) 90 MG TABS tablet Take 1 tablet (90 mg total) by mouth 2 (two) times daily. 09/07/17   [name], PA-C  valACYclovir (VALTREX) 500 MG tablet Take 1 tablet (500 mg total) by mouth daily. 04/14/17   Charlott Rakes, MD  Vitamin D, Ergocalciferol, (DRISDOL) 50000 units  CAPS capsule Take 1 capsule (50,000 Units total) by mouth every 30 (thirty) days. 09/01/17   [name], PA-C  gabapentin (NEURONTIN) 100 MG capsule Take 1 capsule (100 mg total) by mouth 3 (three) times daily. 08/19/17 09/01/17  [name], MD  Inpatient Medications: Scheduled Meds: . allopurinol  100 mg Oral Daily  . amLODipine  10 mg Oral Daily  . aspirin EC  81 mg Oral Daily  . atorvastatin  80 mg Oral q1800  . carvedilol  25 mg Oral BID WC  . docusate sodium  100 mg Oral BID  . enoxaparin (LOVENOX) injection  40 mg Subcutaneous BID  . insulin aspart  0-9 Units Subcutaneous TID WC  . insulin glargine  48 Units Subcutaneous BID  . isosorbide mononitrate  90 mg Oral Daily  . mouth rinse  15 mL Mouth Rinse BID  . mometasone-formoterol  2 puff Inhalation BID  . montelukast  10 mg Oral QHS  . pantoprazole  40 mg Oral QAC breakfast  . ticagrelor  90 mg Oral BID  . [START ON 10/05/2017] Vitamin D (Ergocalciferol)  50,000 Units Oral Q30 days   Continuous Infusions:  PRN Meds: acetaminophen **OR** acetaminophen, albuterol, bisacodyl, docusate sodium, nitroGLYCERIN, ondansetron **OR** ondansetron (ZOFRAN) IV, traZODone  Allergies:    Allergies  Allergen Reactions  . Shellfish Allergy Anaphylaxis and Swelling  . Diazepam Other (See Comments)    "felt like out of body experience"  . Morphine And Related Itching    Social History:   Social History   Socioeconomic History  . Marital status: Divorced    Spouse name: Not on file  . Number of children: Not on file  . Years of education: Not on file  . Highest education level: Not on file  Occupational History  . Not on file  Social Needs  . Financial resource strain: Not on file  . Food insecurity:    Worry: Not on file    Inability: Not on file  . Transportation needs:    Medical: Not on file    Non-medical: Not on file  Tobacco Use  . Smoking status: Former Smoker    Last attempt to quit: 10/25/1980    Years  since quitting: 36.9  . Smokeless tobacco: Never Used  . Tobacco comment: quit smoking in 1982  Substance and Sexual Activity  . Alcohol use: Yes    Comment: 3 times in last year  . Drug use: No    Types: "Crack" cocaine    Comment: 08/21/2016 "clean since 05/1998"  . Sexual activity: Not on file    Comment: Not asked  Lifestyle  . Physical activity:    Days per week: Not on file    Minutes per session: Not on file  . Stress: Not on file  Relationships  . Social connections:    Talks on phone: Not on file    Gets together: Not on file    Attends religious service: Not on file    Active member of club or organization: Not on file    Attends meetings of clubs or organizations: Not on file    Relationship status: Not on file  . Intimate partner violence:    Fear of current or ex partner: Not on file    Emotionally abused: Not on file    Physically abused: Not on file    Forced sexual activity: Not on file  Other Topics Concern  . Not on file  Social History Narrative  . Not on file     Family History:    Family History  Problem Relation Age of Onset  . Colon cancer Mother   . Heart attack Other   . Heart attack Maternal Grandmother   . Hypertension Sister   . Hypertension  Brother   . Diabetes Paternal Grandmother   . Breast cancer Neg Hx       Review of Systems    General:  No chills, fever, night sweats or weight changes.  Cardiovascular:  No chest pain, palpitations, paroxysmal nocturnal dyspnea. Positive for dyspnea on exertion, edema, and orthopnea.  Dermatological: No rash, lesions/masses Respiratory: No cough, dyspnea Urologic: No hematuria, dysuria Abdominal:   No nausea, vomiting, diarrhea, bright red blood per rectum, melena, or hematemesis Neurologic:  No visual changes, wkns, changes in mental status. All other systems reviewed and are otherwise negative except as noted above.  Physical Exam/Data    Vitals:   10/03/17 1930 10/03/17 2028 10/04/17  0401 10/04/17 0652  BP: (!) 153/72 (!) 153/70 124/60   Pulse: 63 77 70 69  Resp: _0 Temp:  98.2 F (36.8 C) 99.2 F (37.3 C)   TempSrc:  Oral Oral   SpO2: 97% 97% 98% 99%  Weight:  (!) 319 lb 6.4 oz (144.9 kg) (!) 318 lb 9.6 oz (144.5 kg)   Height:  _1  (1.727 m)      Intake/Output Summary (Last 24 hours) at 10/04/2017 0745 Last data filed at 10/04/2017 0500 Gross per 24 hour  Intake 72 ml  Output 600 ml  Net -528 ml   Filed Weights   10/03/17 1428 10/03/17 2028 10/04/17 0401  Weight: (!) 302 lb (137 kg) (!) 319 lb 6.4 oz (144.9 kg) (!) 318 lb 9.6 oz (144.5 kg)   Body mass index is 48.44 kg/m.   General: Pleasant, obese African American female appearing in NAD Psych: Normal affect. Neuro: Alert and oriented X 3. Moves all extremities spontaneously. HEENT: Normal  Neck: Supple without bruits. JVD unable to be assessed secondary to body habitus. Lungs:  Resp regular and unlabored, decreased breath sounds along bases bilaterally. Heart: RRR no s3, s4, 2/6 SEM along RUSB. Abdomen: Soft, non-tender, non-distended, BS + x 4.  Extremities: No clubbing or cyanosis. 1+ pitting edema bilaterally. DP/PT/Radials 2+ and equal bilaterally.   EKG:  The EKG was personally reviewed and demonstrates: NSR, HR 70, with no acute ST or T-wave changes when compared to prior tracings.    Labs/Studies     Relevant CV Studies:  Echocardiogram: 09/15/2017 Study Conclusions  - Left ventricle: The cavity size was at the upper limits of   normal. Wall thickness was increased in a pattern of mild LVH.   Systolic function was vigorous. The estimated ejection fraction   was in the range of 65% to 70%. Wall motion was normal; there   were no regional wall motion abnormalities. Doppler parameters   are consistent with abnormal left ventricular relaxation (grade 1   diastolic dysfunction). Doppler parameters are consistent with   high ventricular filling pressure. - Aortic valve:  Transvalvular velocity was increased. There was   moderate stenosis. There was trivial regurgitation. Peak velocity   (S): 308 cm/s. Mean gradient (S): 20 mm Hg. Valve area (VTI):   1.34 cm^2. Valve area (Vmax): 1.11 cm^2. - Ascending aorta: The ascending aorta was mildly dilated. - Mitral valve: Calcified annulus. Mildly thickened leaflets . - Left atrium: The atrium was mildly dilated. - Right ventricle: The cavity size was normal. Systolic function   was normal. - Inferior vena cava: The vessel was dilated. The respirophasic   diameter changes were blunted (< 50%), consistent with elevated   central venous pressure. - Pericardium, extracardiac: A trivial pericardial effusion was   identified  posterior to the heart.   Laboratory Data:  Chemistry Recent Labs  Lab 10/03/17 1500 10/04/17 0415  NA 138 137  K 4.5 4.3  CL 105 105  CO2 21* 21*  GLUCOSE 298* 301*  BUN 95* 98*  CREATININE 2.73* 2.86*  CALCIUM 8.7* 8.4*  GFRNONAA 18* 17*  GFRAA 21* 20*  ANIONGAP 12 11    Recent Labs  Lab 10/03/17 1628  PROT 7.3  ALBUMIN 3.5  AST 74*  ALT 74*  ALKPHOS 393*  BILITOT 0.8   Hematology Recent Labs  Lab 10/03/17 1500  WBC 6.7  RBC 3.08*  HGB 8.5*  HCT 26.4*  MCV 85.7  MCH 27.5  MCHC 32.1  RDW 18.8*  PLT 205   Cardiac Enzymes Recent Labs  Lab 10/03/17 1500  TROPONINI <0.03   No results for input(s): TROPIPOC in the last 168 hours.  BNP Recent Labs  Lab 10/03/17 1628  BNP 147.0*    DDimer No results for input(s): DDIMER in the last 168 hours.  Radiology/Studies:  Dg Chest Port 1 View  Result Date: 10/03/2017 CLINICAL DATA:  Shortness of breath with body swelling for 1 week. History of diabetes. EXAM: PORTABLE CHEST 1 VIEW COMPARISON:  09/15/2017 and 09/14/2017 radiographs. FINDINGS: 1436 hours. The heart size is at the upper limits of normal for portable AP technique. There are mild residual ground-glass opacities throughout the lungs, improved from the  previous studies. There is no confluent airspace opacity, pleural effusion or pneumothorax. The bones appear unremarkable. Telemetry leads overlie the chest. IMPRESSION: Mild residual or recurrent ground-glass pulmonary opacities, improved from earlier this month and likely edema. No new findings. Electronically Signed   By: Richardean Sale M.D.   On: 10/03/2017 15:31     Assessment & Plan    1. Acute on Chronic Diastolic CHF - the patient was just admitted earlier this month for an acute CHF exacerbation and weight had declined to 301 lbs at the time of discharge. She was recently transitioned from Lasix to Torsemide (unknown dose) by Nephrology due to continued weight gain. Had just started Torsemide the day prior to admission.  - Presents with worsening dyspnea on exertion and orthopnea over the past week with associated weight gain of over 20 lbs. CXR shows mild residual or recurrent ground-glass pulmonary opacities, improved from earlier this month and likely edema. BNP falsely low at 147 in the setting of her morbid obesity. No indication to repeat her echo as this was just obtained earlier this month and showed a preserved EF of 65-70%.   - she was started on a Lasix drip at 10 mg/hr on admission but this expired this overnight and she has not received further diuresis. Will re-initiate at 5 mg/hr (dosing she responded well to last admission) until Nephrology can further evaluate the patient. Follow I&O's along with daily weights. Repeat BMET in AM.   2. CAD - s/p DES to Prox-LAD in 09/2016.  - Has baseline dyspnea on exertion but denies any recent chest pain. - Initial troponin negative and EKG shows no acute ischemic changes. No plans for further evaluation at this time. - continue ASA, Brilinta, BB, and statin therapy.   3. Aortic Stenosis - Moderate by most recent echo with mean gradient (S): 20 mm Hg. - continue to follow as an outpatient.   4. HTN - BP well-controlled at 116/65  this morning. - Continue current medication regimen.  5. HLD - Most recent FLP in 09/2016 showed total cholesterol 122, triglycerides  168, HDL 36, and LDL 52. - remains on Atorvastatin 79m daily.   6. Anemia of Chronic Disease - Hgb stable at 8.5. She denies any evidence of active bleeding.   7. Stage 4 CKD - creatinine peaked at 3.96 in 08/2017, improved to 2.50 on 09/20/2017. At 2.73 on admission and trending up to 2.86 today.  - Nephrology consult pending.   8. Morbid Obesity/ OSA - scheduled to see Pulmonology as an outpatient (scheduled for tomorrow and will need to be re-scheduled as patient will remain admitted).     For questions or updates, please contact CEriePlease consult [email or web address] for contact info under Cardiology/STEMI.  Signed, BErma Heritage PA-C 10/04/2017, 7:45 AM Pager: [phone]

## 2017-10-04 NOTE — Progress Notes (Signed)
PT Cancellation Note  Patient Details Name: [name] MRN: [id number] DOB: 02/17/1956   Cancelled Treatment:    Reason Eval/Treat Not Completed: Patient declined, no reason specified(Consult received and chart reviewed.  Patient sleeping upon arrival to room.  Opens eyes and acknowledges therapist into room, but closes eyes and returns to sleep once introduced as therapist.  Continues to minimally acknowledge therapist and verbalize 1-2 word answers, but refuses participation with session and minimally opens eyes further throughout interaction.  Will continue efforts at later time/date as medically appropriate and patient agreeable to participation.)   Lucely Leard H. Owens Shark, PT, DPT, NCS 10/04/17, 3:07 PM [phone]

## 2017-10-04 NOTE — Consult Note (Signed)
[facility] Nurse wound consult note Reason for Consult: Chronic, but healing full thickness wound on left medial thigh. Last seen by this writer 3 weeks ago on 09/15/17.  Wound type: Full thickness Pressure Injury POA: N/A Measurement: 3cm x 5.4cm x 0.4cm Wound bed:red, moist, granulating and contracting Drainage (amount, consistency, odor) small amount light yellow, no odor Periwound:Intact with evidence of previous wound healing Dressing procedure/placement/frequency: I will continue the wound care in place, i.e., silver hydrofiber placed and changed daily.  Patient has [place] who changes wound dressing 3 times/week; patient changes it 4 times/week. Continued HHRN services needed to monitor for changes in exudate and other signs and symptoms of infection.  [place] nursing team will not follow, but will remain available to this patient, the nursing and medical teams.  Please re-consult if needed. Thanks, Maudie Flakes, MSN, RN, [place], Arther Abbott  Pager# [phone]

## 2017-10-04 NOTE — Care Management (Addendum)
Patient readmitted with congestive heart failure.  She was evaluated by Heart Failure Clinic 3.21.  Patient is currently followed at Home by [place], PT SW under [facility] program.  Her current need for 02 is acute.[facility] is aware of admission. Discussed patient's statement of thinking her scales are not working. Heart Failure Clinic instructed patient to check the batteries.  [facility] liaison is reaching out to office to determine if concern regarding scales communicated.

## 2017-10-05 ENCOUNTER — Encounter (HOSPITAL_COMMUNITY): Payer: Self-pay | Admitting: Internal Medicine

## 2017-10-05 ENCOUNTER — Institutional Professional Consult (permissible substitution): Payer: Medicaid Other | Admitting: Internal Medicine

## 2017-10-05 ENCOUNTER — Ambulatory Visit: Payer: Medicaid Other | Admitting: General Surgery

## 2017-10-05 ENCOUNTER — Ambulatory Visit: Payer: Medicaid Other | Admitting: Family

## 2017-10-05 DIAGNOSIS — N184 Chronic kidney disease, stage 4 (severe): Secondary | ICD-10-CM

## 2017-10-05 LAB — BASIC METABOLIC PANEL
Anion gap: 12 (ref 5–15)
BUN: 100 mg/dL — ABNORMAL HIGH (ref 6–20)
CO2: 22 mmol/L (ref 22–32)
Calcium: 8.4 mg/dL — ABNORMAL LOW (ref 8.9–10.3)
Chloride: 103 mmol/L (ref 101–111)
Creatinine, Ser: 2.89 mg/dL — ABNORMAL HIGH (ref 0.44–1.00)
GFR calc Af Amer: 19 mL/min — ABNORMAL LOW (ref >60)
GFR calc non Af Amer: 17 mL/min — ABNORMAL LOW (ref >60)
Glucose, Bld: 264 mg/dL — ABNORMAL HIGH (ref 65–99)
Potassium: 4.3 mmol/L (ref 3.5–5.1)
Sodium: 137 mmol/L (ref 135–145)

## 2017-10-05 LAB — GLUCOSE, CAPILLARY
Glucose-Capillary: 220 mg/dL — ABNORMAL HIGH (ref 65–99)
Glucose-Capillary: 270 mg/dL — ABNORMAL HIGH (ref 65–99)
Glucose-Capillary: 227 mg/dL — ABNORMAL HIGH (ref 65–99)

## 2017-10-05 LAB — MAGNESIUM: Magnesium: 2 mg/dL (ref 1.7–2.4)

## 2017-10-05 MED ORDER — INSULIN ASPART 100 UNIT/ML ~~LOC~~ SOLN
5.0000 [IU] | Freq: Three times a day (TID) | SUBCUTANEOUS | Status: DC
  Administered 2017-10-05 – 2017-10-06 (×3): 5 [IU] via SUBCUTANEOUS
  Filled 2017-10-05 (×3): qty 1

## 2017-10-05 MED ORDER — OXYCODONE-ACETAMINOPHEN 5-325 MG PO TABS
1.0000 | ORAL_TABLET | Freq: Four times a day (QID) | ORAL | Status: DC | PRN
  Administered 2017-10-05 – 2017-10-10 (×4): 1 via ORAL
  Filled 2017-10-05 (×4): qty 1

## 2017-10-05 NOTE — Progress Notes (Signed)
Patient has worked with physical therapy, nurse aid has assisted pt to the bedside commode. After this, patient is refusing the bed alarm. Patient educated on the importance of safety and the bed alarm. Patient states she can get herself on the bedside commode without any assistance. Bed alarm off, will continue to monitor patient.

## 2017-10-05 NOTE — Progress Notes (Signed)
Wound care and dressing change done. Patient complaining of some pain, stated she takes percocet at home.  MD paged to receive pain medication order. Will continue to monitor patient.

## 2017-10-05 NOTE — Progress Notes (Signed)
MD put orders in for pain. Patient received PRN oxycodone-acetaminophen for pain. Will continue to monitor patient.

## 2017-10-05 NOTE — Progress Notes (Signed)
[facility]  ROUNDING NOTE   Subjective:  Renal function predictably worse due to lasix gtt. Lasix gtt increased to 10mg /hr.  UOP 1.8 liters over preceding 24 hours.    Objective:  Vital signs in last 24 hours:  Temp:  [98.2 F (36.8 C)-98.7 F (37.1 C)] 98.2 F (36.8 C) (03/26 0840) Pulse Rate:  [68-71] 71 (03/26 0840) Resp:  [18-20] 18 (03/26 0840) BP: (108-144)/(47-71) 141/59 (03/26 0840) SpO2:  [98 %-100 %] 99 % (03/26 0840) Weight:  [146.2 kg (322 lb 6.4 oz)] 146.2 kg (322 lb 6.4 oz) (03/26 0446)  Weight change: 9.253 kg (20 lb 6.4 oz) Filed Weights   10/03/17 2028 10/04/17 0401 10/05/17 0446  Weight: (!) 144.9 kg (319 lb 6.4 oz) (!) 144.5 kg (318 lb 9.6 oz) (!) 146.2 kg (322 lb 6.4 oz)    Intake/Output: I/O last 3 completed shifts: In: 1017.8 [P.O.:840; I.V.:177.8] Out: 2450 [Urine:2450]   Intake/Output this shift:  Total I/O In: -  Out: 600 [Urine:600]  Physical Exam: General: No acute distress  Head: Normocephalic, atraumatic. Moist oral mucosal membranes  Eyes: Anicteric  Neck: Supple, trachea midline  Lungs:  Minimal rales, normal effort  Heart: S1S2 no rubs  Abdomen:  Soft, nontender, bowel sounds present  Extremities: 2+ peripheral edema.  Neurologic: Awake, alert, following commands  Skin: No lesions  Access:     Basic Metabolic Panel: Recent Labs  Lab 10/03/17 1500 10/04/17 0415 10/05/17 0436  NA 138 137 137  K 4.5 4.3 4.3  CL 105 105 103  CO2 21* 21* 22  GLUCOSE 298* 301* 264*  BUN 95* 98* 100*  CREATININE 2.73* 2.86* 2.89*  CALCIUM 8.7* 8.4* 8.4*  MG  --   --  2.0    Liver Function Tests: Recent Labs  Lab 10/03/17 1628  AST 74*  ALT 74*  ALKPHOS 393*  BILITOT 0.8  PROT 7.3  ALBUMIN 3.5   No results for input(s): LIPASE, AMYLASE in the last 168 hours. No results for input(s): AMMONIA in the last 168 hours.  CBC: Recent Labs  Lab 10/03/17 1500  WBC 6.7  HGB 8.5*  HCT 26.4*  MCV 85.7  PLT 205     Cardiac Enzymes: Recent Labs  Lab 10/03/17 1500  TROPONINI <0.03    BNP: Invalid input(s): POCBNP  CBG: Recent Labs  Lab 10/04/17 0803 10/04/17 1151 10/04/17 1654 10/04/17 2053 10/05/17 0752  GLUCAP 285* 279* 244* 258* 220*    Microbiology: Results for orders placed or performed during the hospital encounter of 09/14/17  MRSA PCR Screening     Status: None   Collection Time: 09/14/17  4:40 PM  Result Value Ref Range Status   MRSA by PCR NEGATIVE NEGATIVE Final    Comment:        The GeneXpert MRSA Assay (FDA approved for NASAL specimens only), is one component of a comprehensive MRSA colonization surveillance program. It is not intended to diagnose MRSA infection nor to guide or monitor treatment for MRSA infections. Performed at [facility], [address], [place] [address]   CULTURE, BLOOD (ROUTINE X 2) w Reflex to ID Panel     Status: None   Collection Time: 09/14/17 11:11 PM  Result Value Ref Range Status   Specimen Description BLOOD LT [facility]  Final   Special Requests   Final    BOTTLES DRAWN AEROBIC AND ANAEROBIC Blood Culture adequate volume   Culture   Final    NO GROWTH 5 DAYS  Performed at [facility], [address] [place], [place] [address]    Report Status 09/19/2017 FINAL  Final  CULTURE, BLOOD (ROUTINE X 2) w Reflex to ID Panel     Status: None   Collection Time: 09/14/17 11:37 PM  Result Value Ref Range Status   Specimen Description BLOOD RT ARM  Final   Special Requests   Final    BOTTLES DRAWN AEROBIC AND ANAEROBIC Blood Culture results may not be optimal due to an inadequate volume of blood received in culture bottles   Culture   Final    NO GROWTH 5 DAYS Performed at [facility], [address] [place], [place] [address]    Report Status 09/19/2017 FINAL  Final  Urine Culture     Status: None   Collection Time: 09/15/17  3:18 AM  Result Value Ref Range Status   Specimen Description    Final    URINE, RANDOM Performed at [facility], [address] [place], [place] [address]    Special Requests   Final    NONE Performed at [facility], [address] [place] [address], [place], [place] [address]    Culture   Final    NO GROWTH Performed at [facility], [place] [address] [place], [place] [address]    Report Status 09/16/2017 FINAL  Final    Coagulation Studies: Recent Labs    10/03/17 1500  LABPROT 13.2  INR 1.01    Urinalysis: Recent Labs    10/03/17 1628  COLORURINE YELLOW*  LABSPEC 1.008  PHURINE 5.0  GLUCOSEU NEGATIVE  HGBUR NEGATIVE  BILIRUBINUR NEGATIVE  KETONESUR NEGATIVE  PROTEINUR NEGATIVE  NITRITE NEGATIVE  LEUKOCYTESUR NEGATIVE      Imaging: Dg Chest Port 1 View  Result Date: 10/03/2017 CLINICAL DATA:  Shortness of breath with body swelling for 1 week. History of diabetes. EXAM: PORTABLE CHEST 1 VIEW COMPARISON:  09/15/2017 and 09/14/2017 radiographs. FINDINGS: 1436 hours. The heart size is at the upper limits of normal for portable AP technique. There are mild residual ground-glass opacities throughout the lungs, improved from the previous studies. There is no confluent airspace opacity, pleural effusion or pneumothorax. The bones appear unremarkable. Telemetry leads overlie the chest. IMPRESSION: Mild residual or recurrent ground-glass pulmonary opacities, improved from earlier this month and likely edema. No new findings. Electronically Signed   By: Richardean Sale M.D.   On: 10/03/2017 15:31     Medications:   . furosemide (LASIX) infusion 10 mg/hr (10/05/17 0826)   . allopurinol  100 mg Oral Daily  . amLODipine  10 mg Oral Daily  . aspirin EC  81 mg Oral Daily  . atorvastatin  80 mg Oral q1800  . carvedilol  25 mg Oral BID WC  . docusate sodium  100 mg Oral BID  . heparin injection (subcutaneous)  5,000 Units Subcutaneous Q8H  . insulin aspart  0-9 Units Subcutaneous TID WC  . insulin aspart  5 Units Subcutaneous  TID WC  . insulin glargine  48 Units Subcutaneous BID  . isosorbide mononitrate  90 mg Oral Daily  . mouth rinse  15 mL Mouth Rinse BID  . mometasone-formoterol  2 puff Inhalation BID  . montelukast  10 mg Oral QHS  . pantoprazole  40 mg Oral QAC breakfast  . ticagrelor  90 mg Oral BID  . Vitamin D (Ergocalciferol)  50,000 Units Oral Q30 days   acetaminophen **OR** acetaminophen, albuterol, bisacodyl, docusate sodium, nitroGLYCERIN, ondansetron **OR** ondansetron (ZOFRAN)  IV, traZODone  Assessment/ Plan:  62 y.o. female African-American femalewith chronic kidney disease stage III followed by Dr. Arty Baumgartner, [facility], hypertension, coronary artery disease, congestive heart failure with diastolic dysfunction, diabetes mellitus type II, hepatitis C chronic, peptic ulcer disease, obstructive sleep apnea  1.  Acute renal failure on chronic kidney disease stage III baseline creatinine 1.81/GFR 35 (Aug 05, 2017) 2.  Peripheral edema. 3.  Hypertension. 4.  Pulmonary edema  Plan:  Lasix drip was increased to 10 mg IV per hour by cardiology today.  This may cause worsening of renal function.  Cardiology following on the case.  She may end up requiring a right heart catheterization.  Continue to monitor renal parameters and serum electro E closely while on high-dose Lasix drip.  Further plan as patient progresses.   LOS: 2 Bryker Fletchall 3/26/20199:57 AM

## 2017-10-05 NOTE — Progress Notes (Signed)
Progress Note  Patient Name: [name] Date of Encounter: 10/05/2017  Primary Cardiologist: Burt Knack  Subjective   SOB improving. Able to lay more supine, though not fully flat 2/2 SOB. Remains on supplemental oxygen via [place] at 2 L. On Lasix gtt at 5 mg/hr with documented UOP of 900 mL for the past 24 hours and net - 1.5 L for the admission. Weight up 4 pounds (318-->322 pounds). Renal function BUN/SCr 98/2.86-->100/2.89.   Inpatient Medications    Scheduled Meds: . allopurinol  100 mg Oral Daily  . amLODipine  10 mg Oral Daily  . aspirin EC  81 mg Oral Daily  . atorvastatin  80 mg Oral q1800  . carvedilol  25 mg Oral BID WC  . docusate sodium  100 mg Oral BID  . heparin injection (subcutaneous)  5,000 Units Subcutaneous Q8H  . insulin aspart  0-9 Units Subcutaneous TID WC  . insulin aspart  5 Units Subcutaneous TID WC  . insulin glargine  48 Units Subcutaneous BID  . isosorbide mononitrate  90 mg Oral Daily  . mouth rinse  15 mL Mouth Rinse BID  . mometasone-formoterol  2 puff Inhalation BID  . montelukast  10 mg Oral QHS  . pantoprazole  40 mg Oral QAC breakfast  . ticagrelor  90 mg Oral BID  . Vitamin D (Ergocalciferol)  50,000 Units Oral Q30 days   Continuous Infusions: . furosemide (LASIX) infusion 10 mg/hr (10/05/17 0826)   PRN Meds: acetaminophen **OR** acetaminophen, albuterol, bisacodyl, docusate sodium, nitroGLYCERIN, ondansetron **OR** ondansetron (ZOFRAN) IV, traZODone   Vital Signs    Vitals:   10/04/17 1708 10/04/17 2016 10/05/17 0446 10/05/17 0840  BP: (!) 136/55 (!) 144/71 (!) 120/56 (!) 141/59  Pulse: 69 71 68 71  Resp:  20  18  Temp:  98.7 F (37.1 C) 98.6 F (37 C) 98.2 F (36.8 C)  TempSrc:  Oral Oral Oral  SpO2:  98% 100% 99%  Weight:   (!) 322 lb 6.4 oz (146.2 kg)   Height:        Intake/Output Summary (Last 24 hours) at 10/05/2017 1018 Last data filed at 10/05/2017 1004 Gross per 24 hour  Intake 1065.83 ml  Output 1300 ml  Net  -234.17 ml   Filed Weights   10/03/17 2028 10/04/17 0401 10/05/17 0446  Weight: (!) 319 lb 6.4 oz (144.9 kg) (!) 318 lb 9.6 oz (144.5 kg) (!) 322 lb 6.4 oz (146.2 kg)    Telemetry    NSR - Personally Reviewed  ECG    n/a - Personally Reviewed  Physical Exam   GEN: No acute distress.   Neck: No JVD. Cardiac: RRR, II/VI systolic murmur, no rubs, or gallops.  Respiratory: Clear to auscultation bilaterally.  GI: Soft, nontender, non-distended.   MS: No edema; No deformity. Neuro:  Alert and oriented x 3; Nonfocal.  Psych: Normal affect.  Labs    Chemistry Recent Labs  Lab 10/03/17 1500 10/03/17 1628 10/04/17 0415 10/05/17 0436  NA 138  --  137 137  K 4.5  --  4.3 4.3  CL 105  --  105 103  CO2 21*  --  21* 22  GLUCOSE 298*  --  301* 264*  BUN 95*  --  98* 100*  CREATININE 2.73*  --  2.86* 2.89*  CALCIUM 8.7*  --  8.4* 8.4*  PROT  --  7.3  --   --   ALBUMIN  --  3.5  --   --  AST  --  74*  --   --   ALT  --  74*  --   --   ALKPHOS  --  393*  --   --   BILITOT  --  0.8  --   --   GFRNONAA 18*  --  17* 17*  GFRAA 21*  --  20* 19*  ANIONGAP 12  --  11 12     Hematology Recent Labs  Lab 10/03/17 1500  WBC 6.7  RBC 3.08*  HGB 8.5*  HCT 26.4*  MCV 85.7  MCH 27.5  MCHC 32.1  RDW 18.8*  PLT 205    Cardiac Enzymes Recent Labs  Lab 10/03/17 1500  TROPONINI <0.03   No results for input(s): TROPIPOC in the last 168 hours.   BNP Recent Labs  Lab 10/03/17 1628  BNP 147.0*     DDimer No results for input(s): DDIMER in the last 168 hours.   Radiology    Dg Chest Port 1 View  Result Date: 10/03/2017 IMPRESSION: Mild residual or recurrent ground-glass pulmonary opacities, improved from earlier this month and likely edema. No new findings. Electronically Signed   By: Richardean Sale M.D.   On: 10/03/2017 15:31    Cardiac Studies   Echocardiogram: 09/15/2017 Study Conclusions  - Left ventricle: The cavity size was at the upper limits  of normal. Wall thickness was increased in a pattern of mild LVH. Systolic function was vigorous. The estimated ejection fraction was in the range of 65% to 70%. Wall motion was normal; there were no regional wall motion abnormalities. Doppler parameters are consistent with abnormal left ventricular relaxation (grade 1 diastolic dysfunction). Doppler parameters are consistent with high ventricular filling pressure. - Aortic valve: Transvalvular velocity was increased. There was moderate stenosis. There was trivial regurgitation. Peak velocity (S): 308 cm/s. Mean gradient (S): 20 mm Hg. Valve area (VTI): 1.34 cm^2. Valve area (Vmax): 1.11 cm^2. - Ascending aorta: The ascending aorta was mildly dilated. - Mitral valve: Calcified annulus. Mildly thickened leaflets . - Left atrium: The atrium was mildly dilated. - Right ventricle: The cavity size was normal. Systolic function was normal. - Inferior vena cava: The vessel was dilated. The respirophasic diameter changes were blunted (<50%), consistent with elevated central venous pressure. - Pericardium, extracardiac: A trivial pericardial effusion was identified posterior to the heart.   Patient Profile     62 y.o. female with history of chronic diastolic CHF, CAD (s/p DES to Prox-LAD in 09/2016), moderate AS, HTN, HLD, morbid obesity, Stage 4 CKD, and OSA who is being seen today for the evaluation of acute CHF.  Assessment & Plan    1. Acute on chronic diastolic CHF: -She continues to appear volume up -Weight increase of 4 pounds overnight -Titrate Lasix gtt to 10 mg/hr -Echo as above -Nephrology on board, may need RRT  2. Acute on CKD stage IV: -Nephrology on board -Lasix as above -May need RRT  3. CAD: -No chest pain -Cycle troponin to rule out -ASA, Briltina, Coreg, Imdur, Lipitor -No plans for inpatient ischemic evaluation at this time  4. Aortic stenosis: -Echo as above -Follow up as an  outpatient  5. Obesity/OSA: -CPAP  6. Anemia on chronic disease: -Per IM  7. HTN: -Continue current medications   For questions or updates, please contact [place] Please consult [email or web address] for contact info under Cardiology/STEMI.    Signed, Christell Faith, PA-C [facility] Pager: [phone] 10/05/2017, 10:18 AM

## 2017-10-05 NOTE — Progress Notes (Signed)
[place] [facility] [place] NAME: [name]    MR#:  [id number]  DATE OF BIRTH:  1956/06/05  SUBJECTIVE:  CHIEF COMPLAINT:   Chief Complaint  Patient presents with  . Shortness of Breath  . Facial Swelling   Better shortness of breath and cough, on oxygen 2 L. REVIEW OF SYSTEMS:  Review of Systems  Constitutional: Negative for chills, fever and malaise/fatigue.  HENT: Negative for sore throat.   Eyes: Negative for blurred vision and double vision.  Respiratory: Positive for cough and shortness of breath. Negative for hemoptysis, sputum production, wheezing and stridor.   Cardiovascular: Negative for chest pain, palpitations, orthopnea and leg swelling.  Gastrointestinal: Negative for abdominal pain, blood in stool, diarrhea, melena, nausea and vomiting.  Genitourinary: Negative for dysuria, flank pain and hematuria.  Musculoskeletal: Negative for back pain and joint pain.  Skin: Negative for rash.  Neurological: Negative for dizziness, sensory change, focal weakness, seizures, loss of consciousness, weakness and headaches.  Endo/Heme/Allergies: Negative for polydipsia.  Psychiatric/Behavioral: Negative for depression. The patient is not nervous/anxious.     DRUG ALLERGIES:   Allergies  Allergen Reactions  . Shellfish Allergy Anaphylaxis and Swelling  . Diazepam Other (See Comments)    "felt like out of body experience"  . Morphine And Related Itching   VITALS:  Blood pressure 140/61, pulse 66, temperature 97.7 F (36.5 C), temperature source Oral, resp. rate 18, height 5\' 8"  (1.727 m), weight (!) 322 lb 6.4 oz (146.2 kg), SpO2 97 %. PHYSICAL EXAMINATION:  Physical Exam  Constitutional: She is oriented to person, place, and time and well-developed, well-nourished, and in no distress.  Morbid obesity.  HENT:  Head: Normocephalic.  Mouth/Throat: Oropharynx is clear and moist.  Eyes: Pupils are equal, round, and reactive to  light. Conjunctivae and EOM are normal. No scleral icterus.  Neck: Normal range of motion. Neck supple. No JVD present. No tracheal deviation present.  Cardiovascular: Normal rate, regular rhythm and normal heart sounds. Exam reveals no gallop.  No murmur heard. Pulmonary/Chest: Effort normal. No respiratory distress. She has no wheezes. She has no rales.  Abdominal: Soft. Bowel sounds are normal. She exhibits no distension. There is no tenderness. There is no rebound.  Musculoskeletal: Normal range of motion. She exhibits edema. She exhibits no tenderness.  Neurological: She is alert and oriented to person, place, and time. No cranial nerve deficit.  Skin: No rash noted. No erythema.  Psychiatric: Affect normal.   LABORATORY PANEL:  Female CBC Recent Labs  Lab 10/03/17 1500  WBC 6.7  HGB 8.5*  HCT 26.4*  PLT 205   ------------------------------------------------------------------------------------------------------------------ Chemistries  Recent Labs  Lab 10/03/17 1628  10/05/17 0436  NA  --    < > 137  K  --    < > 4.3  CL  --    < > 103  CO2  --    < > 22  GLUCOSE  --    < > 264*  BUN  --    < > 100*  CREATININE  --    < > 2.89*  CALCIUM  --    < > 8.4*  MG  --   --  2.0  AST 74*  --   --   ALT 74*  --   --   ALKPHOS 393*  --   --   BILITOT 0.8  --   --    < > = values in  this interval not displayed.   RADIOLOGY:  No results found. ASSESSMENT AND PLAN:   62 year old female patient with shortness of breath, weight gain, has history of combined heart failure, chest x-[place]aimondo showed CHF and she became hypoxic upon walking to 20 feet and sats dropped to 85%.  1 Acute respiratory failure with hypoxia due to acute on chronic diastolic heart failure LV EF: 65% -   70%,  The patient recently discharged from hospital on March 11 comes back with weight gain, pedal edema, shortness of breath  Per Dr.  Saunders Revel, IV Lasix drip is increased, If her creatinine worsens and/or Ms. Drennan does  not have any improvement in her dyspnea and urine output, she may require right heart catheterization to better characterize her filling pressures and exclude pulmonary hypertension/right heart failure or noncardiac etiology as the underlying cause of her dyspnea.  * Acute renal failure on chronic kidney disease stage III baseline creatinine 1.81/GFR 35  Follow-up BMP while on Lasix.  The patient may need renal replacement therapy if renal function get worse per Dr. Holley Raring.  2 History of CAD, patient is on aspirin, Brilinta, beta-blockers.  Statins  3.  Morbid obesity with obstructive sleep apnea: CPAP at night.  #5 history of COPD: No wheezing.  Continue albuterol inhaler as needed.  6.  History of diabetes mellitus type 2: Resumed home dose  Lantus and continue sliding scale.  #.7 history of diabetic neuropathy: Continue Lyrica.  Discussed with Dr. Holley Raring and Mr. Idolina Primer, cardiology PA. All the records are reviewed and case discussed with Care Management/Social Worker. Management plans discussed with the patient, family and they are in agreement.  CODE STATUS: Full Code  TOTAL TIME TAKING CARE OF THIS PATIENT: 36 minutes.   More than 50% of the time was spent in counseling/coordination of care: YES  POSSIBLE D/C IN 2 DAYS, DEPENDING ON CLINICAL CONDITION.   Demetrios Loll M.D on 10/05/2017 at 5:48 PM  Between 7am to 6pm - Pager - [phone]  After 6pm go to [email or web address] - [facility]

## 2017-10-05 NOTE — Progress Notes (Signed)
Inpatient Diabetes Program Recommendations  AACE/ADA: New Consensus Statement on Inpatient Glycemic Control (2015)  Target Ranges:  Prepandial:   less than 140 mg/dL      Peak postprandial:   less than 180 mg/dL (1-2 hours)      Critically ill patients:  140 - 180 mg/dL   Results for SOLARIS, KRAM (MRN [id number]) as of 10/05/2017 10:07  Ref. Range 10/04/2017 08:03 10/04/2017 11:51 10/04/2017 16:54 10/04/2017 20:53 10/05/2017 07:52  Glucose-Capillary Latest Ref Range: 65 - 99 mg/dL 285 (H) 279 (H) 244 (H) 258 (H) 220 (H)   Review of Glycemic Control  Diabetes history: DM2 Outpatient Diabetes medications: Lantus 48 units BID, Novolog 10 units TID with meals, Trulicity 1.58 mg weekly Current orders for Inpatient glycemic control: Lantus 48 units BID, Novolog 0-9 units TID with meals, Novolog 5 units TID with meals for meal coverage  Inpatient Diabetes Program Recommendations: Correction (SSI): Please consider ordering Novolog 0-5 units QHS for bedtime correction. Insulin-Meal Coverage: Noted Novolog 5 units TID with meals for meal coverage was ordered today to start with lunch.  Thanks, Barnie Alderman, RN, MSN, CDE Diabetes Coordinator Inpatient Diabetes Program [phone] (Team Pager from 8am to 5pm)

## 2017-10-05 NOTE — Evaluation (Signed)
Physical Therapy Evaluation Patient Details Name: [name] MRN: [id number] DOB: 1956/01/31 Today's Date: 10/05/2017   History of Present Illness  62 y.o. female with a known history of aortic stenosis, asthma, bronchitis, CHF, diabetes is being admitted for acute hypoxic resp failure.  Was here ~3 weeks ago with same.  Clinical Impression  Pt is able to do prolonged bout of ambulation and stair negotiation on room air but did have considerable fatigue by the end of the effort.  Overall she showed good effort and was relatively confident despite a few small stagger steps/wobbles and needing occasional UE use on rails.  She states that she did more walking today than she has in a while and feels she is not too far from her baseline. She indicated that she is not interested in HHPT when the option was discussed.  Pt safe to return home, though O2 levels did drop with activity (as low as 89% but generally stayed in the mid 90s).    Follow Up Recommendations (Pt not interested in f/u home PT, encouraged to be active)  Will keep pt on PT caseload while here in the hospital to address activity tolerance and balance issues.    Equipment Recommendations       Recommendations for Other Services       Precautions / Restrictions Precautions Precautions: Fall Restrictions Weight Bearing Restrictions: No      Mobility  Bed Mobility               General bed mobility comments: Pt sitting up at EOB on arrival, not tested  Transfers Overall transfer level: Modified independent Equipment used: None Transfers: Sit to/from Stand Sit to Stand: Min guard            Ambulation/Gait Ambulation/Gait assistance: Min guard Ambulation Distance (Feet): 175 Feet Assistive device: None       General Gait Details: Pt did better this session than 2-3 weeks ago as she needed less use of rails, counter tops, etc.  She did have some occasional lateral lean/wobble w/o LOBs or need for assist  to maintain balance.  Pt's O2 (on room air) dropped from high 90s to low 90s (89% briefly) with obvious fatigue, but generally safe for in-home/limited distances  Stairs Stairs: Yes Stairs assistance: Modified independent (Device/Increase time) Stair Management: Two rails Number of Stairs: 4 General stair comments: Pt able to negotiate up/down steps w/o direct assist, some fatigue with the effort  Wheelchair Mobility    Modified Rankin (Stroke Patients Only)       Balance Overall balance assessment: Modified Independent   Sitting balance-Leahy Scale: Good     Standing balance support: No upper extremity supported Standing balance-Leahy Scale: Fair Standing balance comment: Pt did not have any overt LOBs, but showed general unsteadiness, especially as she fatigued                             Pertinent Vitals/Pain Pain Assessment: No/denies pain    Home Living Family/patient expects to be discharged to:: Private residence Living Arrangements: Children Available Help at Discharge: Family;Available PRN/intermittently Type of Home: House Home Access: Stairs to enter Entrance Stairs-Rails: Right;Left;Can reach both Entrance Stairs-Number of Steps: 4 Home Layout: One level Home Equipment: Walker - 2 wheels;Cane - single point      Prior Function Level of Independence: Independent with assistive device(s)         Comments: Pt only out of the house  for MD appointments, uses cane or funiture in the house and can get dressed, etc w/o assist     Hand Dominance   Dominant Hand: Right    Extremity/Trunk Assessment   Upper Extremity Assessment Upper Extremity Assessment: Overall WFL for tasks assessed;Generalized weakness    Lower Extremity Assessment Lower Extremity Assessment: Overall WFL for tasks assessed;Generalized weakness       Communication   Communication: No difficulties  Cognition Arousal/Alertness: Awake/alert Behavior During Therapy: WFL  for tasks assessed/performed Overall Cognitive Status: Within Functional Limits for tasks assessed                                        General Comments      Exercises     Assessment/Plan    PT Assessment Patient needs continued PT services  PT Problem List Decreased strength;Decreased range of motion;Decreased activity tolerance;Decreased balance;Decreased mobility;Decreased coordination;Decreased knowledge of use of DME;Decreased safety awareness       PT Treatment Interventions DME instruction;Gait training;Stair training;Functional mobility training;Therapeutic activities;Therapeutic exercise;Balance training;Neuromuscular re-education;Patient/family education    PT Goals (Current goals can be found in the Care Plan section)  Acute Rehab PT Goals Patient Stated Goal: go home  PT Goal Formulation: With patient Time For Goal Achievement: 10/19/17 Potential to Achieve Goals: Good    Frequency Min 2X/week   Barriers to discharge        Co-evaluation               AM-PAC PT "6 Clicks" Daily Activity  Outcome Measure Difficulty turning over in bed (including adjusting bedclothes, sheets and blankets)?: A Little Difficulty moving from lying on back to sitting on the side of the bed? : A Lot Difficulty sitting down on and standing up from a chair with arms (e.g., wheelchair, bedside commode, etc,.)?: A Little Help needed moving to and from a bed to chair (including a wheelchair)?: None Help needed walking in hospital room?: A Little Help needed climbing 3-5 steps with a railing? : A Little 6 Click Score: 18    End of Session Equipment Utilized During Treatment: Gait belt Activity Tolerance: Patient limited by fatigue Patient left: with bed alarm set;with call bell/phone within reach Nurse Communication: Mobility status PT Visit Diagnosis: Muscle weakness (generalized) (M62.81);Difficulty in walking, not elsewhere classified (R26.2)    Time:  1012-1040 PT Time Calculation (min) (ACUTE ONLY): 28 min   Charges:   PT Evaluation $PT Eval Low Complexity: 1 Low     PT G CodesKreg Shropshire, DPT 10/05/2017, 1:49 PM

## 2017-10-06 ENCOUNTER — Telehealth: Payer: Self-pay

## 2017-10-06 ENCOUNTER — Ambulatory Visit: Payer: Medicaid Other | Admitting: Family

## 2017-10-06 DIAGNOSIS — G4733 Obstructive sleep apnea (adult) (pediatric): Secondary | ICD-10-CM

## 2017-10-06 DIAGNOSIS — R601 Generalized edema: Secondary | ICD-10-CM

## 2017-10-06 LAB — BASIC METABOLIC PANEL
Anion gap: 12 (ref 5–15)
BUN: 100 mg/dL — ABNORMAL HIGH (ref 6–20)
CO2: 22 mmol/L (ref 22–32)
Calcium: 8.5 mg/dL — ABNORMAL LOW (ref 8.9–10.3)
Chloride: 102 mmol/L (ref 101–111)
Creatinine, Ser: 2.52 mg/dL — ABNORMAL HIGH (ref 0.44–1.00)
GFR calc Af Amer: 23 mL/min — ABNORMAL LOW (ref >60)
GFR calc non Af Amer: 20 mL/min — ABNORMAL LOW (ref >60)
Glucose, Bld: 288 mg/dL — ABNORMAL HIGH (ref 65–99)
Potassium: 4 mmol/L (ref 3.5–5.1)
Sodium: 136 mmol/L (ref 135–145)

## 2017-10-06 LAB — GLUCOSE, CAPILLARY
Glucose-Capillary: 230 mg/dL — ABNORMAL HIGH (ref 65–99)
Glucose-Capillary: 337 mg/dL — ABNORMAL HIGH (ref 65–99)
Glucose-Capillary: 360 mg/dL — ABNORMAL HIGH (ref 65–99)
Glucose-Capillary: 238 mg/dL — ABNORMAL HIGH (ref 65–99)
Glucose-Capillary: 234 mg/dL — ABNORMAL HIGH (ref 65–99)

## 2017-10-06 MED ORDER — HYDROXYZINE HCL 25 MG PO TABS
50.0000 mg | ORAL_TABLET | Freq: Three times a day (TID) | ORAL | Status: DC | PRN
  Administered 2017-10-06 – 2017-10-10 (×6): 50 mg via ORAL
  Filled 2017-10-06 (×7): qty 2

## 2017-10-06 MED ORDER — INSULIN ASPART 100 UNIT/ML ~~LOC~~ SOLN
0.0000 [IU] | Freq: Every day | SUBCUTANEOUS | Status: DC
Start: 2017-10-06 — End: 2017-10-11
  Administered 2017-10-06: 2 [IU] via SUBCUTANEOUS
  Filled 2017-10-06: qty 1

## 2017-10-06 MED ORDER — INSULIN GLARGINE 100 UNIT/ML ~~LOC~~ SOLN
50.0000 [IU] | Freq: Two times a day (BID) | SUBCUTANEOUS | Status: DC
  Administered 2017-10-06 – 2017-10-11 (×10): 50 [IU] via SUBCUTANEOUS
  Filled 2017-10-06 (×11): qty 0.5

## 2017-10-06 MED ORDER — INSULIN ASPART 100 UNIT/ML ~~LOC~~ SOLN
8.0000 [IU] | Freq: Three times a day (TID) | SUBCUTANEOUS | Status: DC
  Administered 2017-10-06 – 2017-10-11 (×12): 8 [IU] via SUBCUTANEOUS
  Filled 2017-10-06 (×13): qty 1

## 2017-10-06 MED ORDER — INSULIN ASPART 100 UNIT/ML ~~LOC~~ SOLN
0.0000 [IU] | Freq: Three times a day (TID) | SUBCUTANEOUS | Status: DC
  Administered 2017-10-06: 3 [IU] via SUBCUTANEOUS
  Administered 2017-10-06: 9 [IU] via SUBCUTANEOUS
  Administered 2017-10-07: 3 [IU] via SUBCUTANEOUS
  Administered 2017-10-07 – 2017-10-08 (×3): 1 [IU] via SUBCUTANEOUS
  Administered 2017-10-09: 2 [IU] via SUBCUTANEOUS
  Administered 2017-10-09: 1 [IU] via SUBCUTANEOUS
  Administered 2017-10-09: 3 [IU] via SUBCUTANEOUS
  Administered 2017-10-10 (×2): 5 [IU] via SUBCUTANEOUS
  Administered 2017-10-11: 2 [IU] via SUBCUTANEOUS
  Filled 2017-10-06 (×12): qty 1

## 2017-10-06 MED ORDER — HYDRALAZINE HCL 25 MG PO TABS
25.0000 mg | ORAL_TABLET | Freq: Three times a day (TID) | ORAL | Status: DC
  Administered 2017-10-06 – 2017-10-07 (×3): 25 mg via ORAL
  Filled 2017-10-06 (×3): qty 1

## 2017-10-06 MED ORDER — FUROSEMIDE 10 MG/ML IJ SOLN
10.0000 mg/h | INTRAVENOUS | Status: AC
  Administered 2017-10-06 – 2017-10-07 (×2): 10 mg/h via INTRAVENOUS
  Filled 2017-10-06: qty 25

## 2017-10-06 NOTE — Progress Notes (Addendum)
[facility] NAME: [name]    MR#:  [id number]  DATE OF BIRTH:  07-29-1955  SUBJECTIVE:  CHIEF COMPLAINT:   Chief Complaint  Patient presents with  . Shortness of Breath  . Facial Swelling   Better shortness of breath and cough, off oxygen 2 L. REVIEW OF SYSTEMS:  Review of Systems  Constitutional: Negative for chills, fever and malaise/fatigue.  HENT: Negative for sore throat.   Eyes: Negative for blurred vision and double vision.  Respiratory: Positive for cough and shortness of breath. Negative for hemoptysis, sputum production, wheezing and stridor.   Cardiovascular: Positive for leg swelling. Negative for chest pain, palpitations and orthopnea.  Gastrointestinal: Negative for abdominal pain, blood in stool, diarrhea, melena, nausea and vomiting.  Genitourinary: Negative for dysuria, flank pain and hematuria.  Musculoskeletal: Negative for back pain and joint pain.  Skin: Negative for rash.  Neurological: Negative for dizziness, sensory change, focal weakness, seizures, loss of consciousness, weakness and headaches.  Endo/Heme/Allergies: Negative for polydipsia.  Psychiatric/Behavioral: Negative for depression. The patient is not nervous/anxious.     DRUG ALLERGIES:   Allergies  Allergen Reactions  . Shellfish Allergy Anaphylaxis and Swelling  . Diazepam Other (See Comments)    "felt like out of body experience"  . Morphine And Related Itching   VITALS:  Blood pressure (!) 160/68, pulse 75, temperature 98.7 F (37.1 C), temperature source Oral, resp. rate 18, height 5\' 8"  (1.727 m), weight (!) 315 lb 1.6 oz (142.9 kg), SpO2 96 %. PHYSICAL EXAMINATION:  Physical Exam  Constitutional: She is oriented to person, place, and time and well-developed, well-nourished, and in no distress.  Morbid obesity.  HENT:  Head: Normocephalic.  Mouth/Throat: Oropharynx is clear and moist.  Eyes: Pupils are equal, round,  and reactive to light. Conjunctivae and EOM are normal. No scleral icterus.  Neck: Normal range of motion. Neck supple. No JVD present. No tracheal deviation present.  Cardiovascular: Normal rate, regular rhythm and normal heart sounds. Exam reveals no gallop.  No murmur heard. Pulmonary/Chest: Effort normal. No respiratory distress. She has no wheezes. She has no rales.  Abdominal: Soft. Bowel sounds are normal. She exhibits no distension. There is no tenderness. There is no rebound.  Musculoskeletal: Normal range of motion. She exhibits edema. She exhibits no tenderness.  Neurological: She is alert and oriented to person, place, and time. No cranial nerve deficit.  Skin: No rash noted. No erythema.  Psychiatric: Affect normal.   LABORATORY PANEL:  Female CBC Recent Labs  Lab 10/03/17 1500  WBC 6.7  HGB 8.5*  HCT 26.4*  PLT 205   ------------------------------------------------------------------------------------------------------------------ Chemistries  Recent Labs  Lab 10/03/17 1628  10/05/17 0436 10/06/17 0346  NA  --    < > 137 136  K  --    < > 4.3 4.0  CL  --    < > 103 102  CO2  --    < > 22 22  GLUCOSE  --    < > 264* 288*  BUN  --    < > 100* 100*  CREATININE  --    < > 2.89* 2.52*  CALCIUM  --    < > 8.4* 8.5*  MG  --   --  2.0  --   AST 74*  --   --   --   ALT 74*  --   --   --   ALKPHOS 393*  --   --   --  BILITOT 0.8  --   --   --    < > = values in this interval not displayed.   RADIOLOGY:  No results found. ASSESSMENT AND PLAN:   62 year old female patient with shortness of breath, weight gain, has history of combined heart failure, chest x-Migues showed CHF and she became hypoxic upon walking to 20 feet and sats dropped to 85%.  1 Acute respiratory failure with hypoxia due to acute on chronic diastolic heart failure LV EF: 65% -   70%,  The patient recently discharged from hospital on March 11 comes back with weight gain, pedal edema, shortness of  breath  Per Dr.  Saunders Revel, IV Lasix drip is increased, If her creatinine worsens and/or [name] does not have any improvement in her dyspnea and urine output, she may require right heart catheterization to better characterize her filling pressures and exclude pulmonary hypertension/right heart failure or noncardiac etiology as the underlying cause of her dyspnea. Per Dr. Rockey Situ, Continue Lasix gtt to 10 mg/hr, will likely need a few more days of diuresis.  * Acute renal failure on chronic kidney disease stage III baseline creatinine 1.81/GFR 35  Follow-up BMP while on Lasix drip.  Recommend transition to torsemide upon discharge per Dr. Holley Raring.  2 History of CAD, patient is on aspirin, Brilinta, beta-blockers.  Statins  3.  Morbid obesity with obstructive sleep apnea: CPAP at night.  #5 history of COPD: No wheezing.  Continue albuterol inhaler as needed.  6.    Hyperglycemia with history of diabetes mellitus type 2:  Increased Lantus to 50 units twice daily, increased NovoLog 8 units AC and continue sliding scale.  #.7 history of diabetic neuropathy: Continue Lyrica.  Discussed with Dr. Holley Raring and Mr. Idolina Primer, cardiology PA. All the records are reviewed and case discussed with Care Management/Social Worker. Management plans discussed with the patient, family and they are in agreement.  CODE STATUS: Full Code  TOTAL TIME TAKING CARE OF THIS PATIENT: 36 minutes.   More than 50% of the time was spent in counseling/coordination of care: YES  POSSIBLE D/C IN 3 DAYS, DEPENDING ON CLINICAL CONDITION.   Demetrios Loll M.D on 10/06/2017 at 4:17 PM  Between 7am to 6pm - Pager - [phone]  After 6pm go to [email or web address] - [facility]

## 2017-10-06 NOTE — Plan of Care (Signed)
No complaints of pain this shift, BM this shift, also very good urine output as well. Lasix gtt still infusing @ 73ml/hr. Pt tearful about the thoughts of having to go on dialysis, pt reassured and information about dialysis ans chronic kidney disease printed off for pt to read.

## 2017-10-06 NOTE — Progress Notes (Signed)
Pt complaining of itching, pt has scratched herself until she's bleeding. Pt takes atarax at home 50mg  PRN TID. Dr. Irving Burton gave orders to continue home dose.  Conley Simmonds, RN, BSN

## 2017-10-06 NOTE — Progress Notes (Signed)
Inpatient Diabetes Program Recommendations  AACE/ADA: New Consensus Statement on Inpatient Glycemic Control (2015)  Target Ranges:  Prepandial:   less than 140 mg/dL      Peak postprandial:   less than 180 mg/dL (1-2 hours)      Critically ill patients:  140 - 180 mg/dL  Results for ASHLIE, MCMENAMY (MRN [id number]) as of 10/06/2017 08:55  Ref. Range 10/05/2017 07:52 10/05/2017 12:27 10/05/2017 16:48 10/05/2017 20:43 10/06/2017 07:59  Glucose-Capillary Latest Ref Range: 65 - 99 mg/dL 220 (H) 270 (H) 227 (H) 230 (H) 337 (H)    Review of Glycemic Control  Diabetes history:DM2 Outpatient Diabetes medications:Lantus 48 units BID, Novolog 10 units TID with meals, Trulicity 8.01 mg weekly Current orders for Inpatient glycemic control:Lantus 48 units BID, Novolog 0-9 units TID with meals, Novolog 5 units TID with meals for meal coverage  Inpatient Diabetes Program Recommendations: Insulin-Basal: Please consider increasing Lantus to 50 units BID. Correction (SSI):Bedtime glucose consistently elevated but no insulin correction being given since none ordered. Please consider ordering Novolog 0-5 units QHS for bedtime correction. Insulin-Meal Coverage: Please consider increasing meal coverage to Novolog 8 units TID with meals if patient eats at least 50% of meals.  Thanks, Barnie Alderman, RN, MSN, CDE Diabetes Coordinator Inpatient Diabetes Program [phone] (Team Pager from 8am to 5pm)

## 2017-10-06 NOTE — Progress Notes (Signed)
[facility]  ROUNDING NOTE   Subjective:  Good urine output of 3.2 L over the preceding 24 hours. Patient remains on Lasix drip 10 mg IV per hour.   Objective:  Vital signs in last 24 hours:  Temp:  [97.7 F (36.5 C)-98.7 F (37.1 C)] 98.7 F (37.1 C) (03/27 0757) Pulse Rate:  [66-75] 75 (03/27 0757) Resp:  [18] 18 (03/26 1705) BP: (131-160)/(54-68) 160/68 (03/27 0757) SpO2:  [92 %-97 %] 96 % (03/27 0757) Weight:  [142.9 kg (315 lb 1.6 oz)] 142.9 kg (315 lb 1.6 oz) (03/27 0355)  Weight change: -3.311 kg (-7 lb 4.8 oz) Filed Weights   10/04/17 0401 10/05/17 0446 10/06/17 0355  Weight: (!) 144.5 kg (318 lb 9.6 oz) (!) 146.2 kg (322 lb 6.4 oz) (!) 142.9 kg (315 lb 1.6 oz)    Intake/Output: I/O last 3 completed shifts: In: 1054.3 [P.O.:720; I.V.:334.3] Out: 3950 [Urine:3950]   Intake/Output this shift:  Total I/O In: 240 [P.O.:240] Out: 1600 [Urine:1600]  Physical Exam: General: No acute distress  Head: Normocephalic, atraumatic. Moist oral mucosal membranes  Eyes: Anicteric  Neck: Supple, trachea midline  Lungs:  Minimal rales, normal effort  Heart: S1S2 no rubs  Abdomen:  Soft, nontender, bowel sounds present  Extremities: 2+ peripheral edema.  Neurologic: Awake, alert, following commands  Skin: No lesions  Access:     Basic Metabolic Panel: Recent Labs  Lab 10/03/17 1500 10/04/17 0415 10/05/17 0436 10/06/17 0346  NA 138 137 137 136  K 4.5 4.3 4.3 4.0  CL 105 105 103 102  CO2 21* 21* 22 22  GLUCOSE 298* 301* 264* 288*  BUN 95* 98* 100* 100*  CREATININE 2.73* 2.86* 2.89* 2.52*  CALCIUM 8.7* 8.4* 8.4* 8.5*  MG  --   --  2.0  --     Liver Function Tests: Recent Labs  Lab 10/03/17 1628  AST 74*  ALT 74*  ALKPHOS 393*  BILITOT 0.8  PROT 7.3  ALBUMIN 3.5   No results for input(s): LIPASE, AMYLASE in the last 168 hours. No results for input(s): AMMONIA in the last 168 hours.  CBC: Recent Labs  Lab 10/03/17 1500  WBC 6.7  HGB  8.5*  HCT 26.4*  MCV 85.7  PLT 205    Cardiac Enzymes: Recent Labs  Lab 10/03/17 1500  TROPONINI <0.03    BNP: Invalid input(s): POCBNP  CBG: Recent Labs  Lab 10/05/17 1227 10/05/17 1648 10/05/17 2043 10/06/17 0759 10/06/17 1134  GLUCAP 270* 227* 230* 32* 360*    Microbiology: Results for orders placed or performed during the hospital encounter of 09/14/17  MRSA PCR Screening     Status: None   Collection Time: 09/14/17  4:40 PM  Result Value Ref Range Status   MRSA by PCR NEGATIVE NEGATIVE Final    Comment:        The GeneXpert MRSA Assay (FDA approved for NASAL specimens only), is one component of a comprehensive MRSA colonization surveillance program. It is not intended to diagnose MRSA infection nor to guide or monitor treatment for MRSA infections. Performed at [facility], [address] [place],  [address]   CULTURE, BLOOD (ROUTINE X 2) w Reflex to ID Panel     Status: None   Collection Time: 09/14/17 11:11 PM  Result Value Ref Range Status   Specimen Description BLOOD LT [facility]  Final   Special Requests   Final    BOTTLES DRAWN AEROBIC AND ANAEROBIC Blood Culture adequate volume  Culture   Final    NO GROWTH 5 DAYS Performed at [facility], [address] [place], [place] [address]    Report Status 09/19/2017 FINAL  Final  CULTURE, BLOOD (ROUTINE X 2) w Reflex to ID Panel     Status: None   Collection Time: 09/14/17 11:37 PM  Result Value Ref Range Status   Specimen Description BLOOD RT ARM  Final   Special Requests   Final    BOTTLES DRAWN AEROBIC AND ANAEROBIC Blood Culture results may not be optimal due to an inadequate volume of blood received in culture bottles   Culture   Final    NO GROWTH 5 DAYS Performed at [facility], [address] [place], [place] [address]    Report Status 09/19/2017 FINAL  Final  Urine Culture     Status: None   Collection Time: 09/15/17  3:18 AM  Result Value Ref  Range Status   Specimen Description   Final    URINE, RANDOM Performed at [facility], [address] [place], [place] [address]    Special Requests   Final    NONE Performed at [facility], [address] [place], [place] [address]    Culture   Final    NO GROWTH Performed at [facility], [place] [address], [place], [place] [address]    Report Status 09/16/2017 FINAL  Final    Coagulation Studies: Recent Labs    10/03/17 1500  LABPROT 13.2  INR 1.01    Urinalysis: Recent Labs    10/03/17 1628  COLORURINE YELLOW*  LABSPEC 1.008  PHURINE 5.0  GLUCOSEU NEGATIVE  HGBUR NEGATIVE  BILIRUBINUR NEGATIVE  KETONESUR NEGATIVE  PROTEINUR NEGATIVE  NITRITE NEGATIVE  LEUKOCYTESUR NEGATIVE      Imaging: No results found.   Medications:   . furosemide (LASIX) infusion 10 mg/hr (10/06/17 1053)   . allopurinol  100 mg Oral Daily  . aspirin EC  81 mg Oral Daily  . atorvastatin  80 mg Oral q1800  . carvedilol  25 mg Oral BID WC  . docusate sodium  100 mg Oral BID  . heparin injection (subcutaneous)  5,000 Units Subcutaneous Q8H  . hydrALAZINE  25 mg Oral Q8H  . insulin aspart  0-5 Units Subcutaneous QHS  . insulin aspart  0-9 Units Subcutaneous TID WC  . insulin aspart  8 Units Subcutaneous TID WC  . insulin glargine  50 Units Subcutaneous BID  . isosorbide mononitrate  90 mg Oral Daily  . mouth rinse  15 mL Mouth Rinse BID  . mometasone-formoterol  2 puff Inhalation BID  . montelukast  10 mg Oral QHS  . pantoprazole  40 mg Oral QAC breakfast  . ticagrelor  90 mg Oral BID  . Vitamin D (Ergocalciferol)  50,000 Units Oral Q30 days   acetaminophen **OR** acetaminophen, albuterol, bisacodyl, docusate sodium, nitroGLYCERIN, ondansetron **OR** ondansetron (ZOFRAN) IV, oxyCODONE-acetaminophen, traZODone  Assessment/ Plan:  62 y.o. female African-American femalewith chronic kidney disease stage III followed by Dr. Arty Baumgartner, [facility], hypertension, coronary artery disease, congestive heart failure with diastolic dysfunction, diabetes mellitus type II, hepatitis C chronic, peptic ulcer disease, obstructive sleep apnea  1.  Acute renal failure on chronic kidney disease stage III baseline creatinine 1.81/GFR 35 (Aug 05, 2017) 2.  Peripheral edema. 3.  Hypertension. 4.  Pulmonary edema  Plan:  Patient did have good urine output of 3.2 L over the preceding 24 hours.  Therefore patient  will be maintained on Lasix drip at 10 mg IV per hour.  Case discussed with cardiology.  Right heart catheterization to be deferred for now.  Continue efforts at diuresis.  Continue to monitor renal parameters very closely while the patient remains on Lasix drip.  Recommend transition to torsemide upon discharge.   LOS: 3 Ramina Hulet 3/27/201912:16 PM

## 2017-10-06 NOTE — Care Management (Signed)
Obtained orders for patient to be followed by Advanced Heart Failure Protocol at discharge.  There is discussion of right heart cath

## 2017-10-06 NOTE — Telephone Encounter (Signed)
Yes I have seen notes. Thanks.

## 2017-10-06 NOTE — Telephone Encounter (Signed)
Patient called office to inform her physician that she has been hospitalized since the weekend. KW

## 2017-10-06 NOTE — Progress Notes (Addendum)
Progress Note  Patient Name: [name] Date of Encounter: 10/06/2017  Primary Cardiologist: Burt Knack  Subjective   SOB slowly improving, though still SOB with ambulation. Minus 2.3 L for the past 24 hours and net - 4.3 L for the admission. Weight 322-->315 pounds. Renal function 2.89-->2.52. Remains on Lasix gtt 10 mg/hr. BP in the 237S systolic. No chest pain. Feels like LE swelling is improving.   Inpatient Medications    Scheduled Meds: . allopurinol  100 mg Oral Daily  . aspirin EC  81 mg Oral Daily  . atorvastatin  80 mg Oral q1800  . carvedilol  25 mg Oral BID WC  . docusate sodium  100 mg Oral BID  . heparin injection (subcutaneous)  5,000 Units Subcutaneous Q8H  . hydrALAZINE  25 mg Oral Q8H  . insulin aspart  0-9 Units Subcutaneous TID WC  . insulin aspart  5 Units Subcutaneous TID WC  . insulin glargine  48 Units Subcutaneous BID  . isosorbide mononitrate  90 mg Oral Daily  . mouth rinse  15 mL Mouth Rinse BID  . mometasone-formoterol  2 puff Inhalation BID  . montelukast  10 mg Oral QHS  . pantoprazole  40 mg Oral QAC breakfast  . ticagrelor  90 mg Oral BID  . Vitamin D (Ergocalciferol)  50,000 Units Oral Q30 days   Continuous Infusions:  PRN Meds: acetaminophen **OR** acetaminophen, albuterol, bisacodyl, docusate sodium, nitroGLYCERIN, ondansetron **OR** ondansetron (ZOFRAN) IV, oxyCODONE-acetaminophen, traZODone   Vital Signs    Vitals:   10/05/17 1705 10/05/17 1941 10/06/17 0355 10/06/17 0757  BP: 140/61 (!) 131/54 (!) 152/67 (!) 160/68  Pulse: 66 67 70 75  Resp: 18     Temp: 97.7 F (36.5 C) 97.9 F (36.6 C) 97.8 F (36.6 C) 98.7 F (37.1 C)  TempSrc: Oral Oral Oral Oral  SpO2: 97% 95% 92% 96%  Weight:   (!) 315 lb 1.6 oz (142.9 kg)   Height:        Intake/Output Summary (Last 24 hours) at 10/06/2017 0905 Last data filed at 10/06/2017 0800 Gross per 24 hour  Intake 948.5 ml  Output 3850 ml  Net -2901.5 ml   Filed Weights   10/04/17  0401 10/05/17 0446 10/06/17 0355  Weight: (!) 318 lb 9.6 oz (144.5 kg) (!) 322 lb 6.4 oz (146.2 kg) (!) 315 lb 1.6 oz (142.9 kg)    Telemetry    NSR - Personally Reviewed  ECG    n/a - Personally Reviewed  Physical Exam   GEN: No acute distress.   Neck: No JVD. Cardiac: RRR, II/VI systolic murmur, no rubs, or gallops.  Respiratory: Mildly diminished along the bilateral bases.  GI: Soft, nontender, non-distended.   MS: 1+ bilateral lower extremity edema to the knees; No deformity. Neuro:  Alert and oriented x 3; Nonfocal.  Psych: Normal affect.  Labs    Chemistry Recent Labs  Lab 10/03/17 1628 10/04/17 0415 10/05/17 0436 10/06/17 0346  NA  --  137 137 136  K  --  4.3 4.3 4.0  CL  --  105 103 102  CO2  --  21* 22 22  GLUCOSE  --  301* 264* 288*  BUN  --  98* 100* 100*  CREATININE  --  2.86* 2.89* 2.52*  CALCIUM  --  8.4* 8.4* 8.5*  PROT 7.3  --   --   --   ALBUMIN 3.5  --   --   --   AST  74*  --   --   --   ALT 74*  --   --   --   ALKPHOS 393*  --   --   --   BILITOT 0.8  --   --   --   GFRNONAA  --  17* 17* 20*  GFRAA  --  20* 19* 23*  ANIONGAP  --  11 12 12      Hematology Recent Labs  Lab 10/03/17 1500  WBC 6.7  RBC 3.08*  HGB 8.5*  HCT 26.4*  MCV 85.7  MCH 27.5  MCHC 32.1  RDW 18.8*  PLT 205    Cardiac Enzymes Recent Labs  Lab 10/03/17 1500  TROPONINI <0.03   No results for input(s): TROPIPOC in the last 168 hours.   BNP Recent Labs  Lab 10/03/17 1628  BNP 147.0*     DDimer No results for input(s): DDIMER in the last 168 hours.   Radiology    No results found.  Cardiac Studies   Echocardiogram: 09/15/2017 Study Conclusions  - Left ventricle: The cavity size was at the upper limits of normal. Wall thickness was increased in a pattern of mild LVH. Systolic function was vigorous. The estimated ejection fraction was in the range of 65% to 70%. Wall motion was normal; there were no regional wall motion abnormalities.  Doppler parameters are consistent with abnormal left ventricular relaxation (grade 1 diastolic dysfunction). Doppler parameters are consistent with high ventricular filling pressure. - Aortic valve: Transvalvular velocity was increased. There was moderate stenosis. There was trivial regurgitation. Peak velocity (S): 308 cm/s. Mean gradient (S): 20 mm Hg. Valve area (VTI): 1.34 cm^2. Valve area (Vmax): 1.11 cm^2. - Ascending aorta: The ascending aorta was mildly dilated. - Mitral valve: Calcified annulus. Mildly thickened leaflets . - Left atrium: The atrium was mildly dilated. - Right ventricle: The cavity size was normal. Systolic function was normal. - Inferior vena cava: The vessel was dilated. The respirophasic diameter changes were blunted (<50%), consistent with elevated central venous pressure. - Pericardium, extracardiac: A trivial pericardial effusion was identified posterior to the heart.   Patient Profile     62 y.o. female with history of chronic diastolic CHF, CAD (s/p DES to Prox-LAD in 09/2016), moderateAS, HTN, HLD, morbid obesity, Stage 4 CKD, and OSAwho is being seen today for the evaluation of acute CHF.  Assessment & Plan    1. Acute on chronic diastolic CHF: -She continues to appear volume up, though improving -Weight increase of 4 pounds overnight -Continue Lasix gtt to 10 mg/hr -Echo as above -May benefit from [place] prior to discharge vs outpatient  -Nephrology on board  2. Acute on CKD stage IV: -Nephrology on board -Lasix as above -Continue to monitor with diuresis   3. CAD: -No chest pain -Cycle troponin to rule out -ASA, Briltina, Coreg, Imdur, Lipitor -No plans for inpatient ischemic evaluation at this time  4. Aortic stenosis: -Echo as above -Follow up as an outpatient  5. Obesity/OSA: -CPAP  6. Anemia of chronic disease: -Per IM  7. HTN: -Add hydralazine 25 mg tid -Continue to attempt to avoid amlodipine  if able -Continue remaining medications   For questions or updates, please contact [facility] Please consult [email or web address] for contact info under Cardiology/STEMI.    Signed, Christell Faith, PA-C [place] Pager: [phone] 10/06/2017, 9:05 AM   Attending Note Patient seen and examined, agree with detailed note above,  Patient presentation and plan discussed on rounds.   Still SOB,  severe pitting leg swelling with tenderness, Significant urine outpt Weight down  Constitutional:  oriented to person, place, and time. No distress.  HENT:  Head: Normocephalic and atraumatic.  Eyes:  no discharge. No scleral icterus.  Neck: Normal range of motion. Neck supple. No JVD present.  Cardiovascular: Normal rate, regular rhythm, normal heart sounds and intact distal pulses. Exam reveals no gallop and no friction rub. No edema No murmur heard. Pulmonary/Chest: Effort normal and breath sounds normal. No stridor. No respiratory distress.  no wheezes.  no rales.  no tenderness.  Abdominal: Soft.  no distension.  no tenderness.  Musculoskeletal: Normal range of motion.  no  tenderness or deformity.  Neurological:  normal muscle tone. Coordination normal. No atrophy Skin: Skin is warm and dry. No rash noted. not diaphoretic.  Psychiatric:  normal mood and affect. behavior is normal. Thought content normal.   Lab reviewed CR 2.5, BUN 100, BNP 147, ast 74  A/P 1. Acute on chronic diastolic CHF: volume up, though improving Continue Lasix gtt to 10 mg/hr Will likely need a few more days of diuresis  2. Acute on CKD stage IV: Nephrology following Lasix infusion  3. CAD: ASA, Briltina, Coreg, Imdur, Lipitor No plans for inpatient ischemic evaluation at this time  4. Aortic stenosis: Mean gradient of 20, outpt follow up Not severe  5. Obesity/OSA: Not on CPAP Needs repeat sleep study, Then needs charity to help purchase a CPAP  6. Anemia of chronic  disease: Secondary to CRI  7. HTN: hydralazine 25 mg tid -Continue remaining medications  Case discussed with Dr. Holley Raring No urgent need for RHC at this time, still way fluid up Greater than 50% was spent in counseling and coordination of care with patient Total encounter time 35 minutes or more   Signed: Esmond Plants  M.D., Ph.D. [facility]

## 2017-10-07 DIAGNOSIS — R0600 Dyspnea, unspecified: Secondary | ICD-10-CM

## 2017-10-07 DIAGNOSIS — R0602 Shortness of breath: Secondary | ICD-10-CM

## 2017-10-07 DIAGNOSIS — R0902 Hypoxemia: Secondary | ICD-10-CM

## 2017-10-07 LAB — BASIC METABOLIC PANEL
Anion gap: 13 (ref 5–15)
Anion gap: 12 (ref 5–15)
BUN: 100 mg/dL — ABNORMAL HIGH (ref 6–20)
BUN: 99 mg/dL — ABNORMAL HIGH (ref 6–20)
CO2: 23 mmol/L (ref 22–32)
CO2: 23 mmol/L (ref 22–32)
Calcium: 8.8 mg/dL — ABNORMAL LOW (ref 8.9–10.3)
Calcium: 8.9 mg/dL (ref 8.9–10.3)
Chloride: 104 mmol/L (ref 101–111)
Chloride: 105 mmol/L (ref 101–111)
Creatinine, Ser: 2.52 mg/dL — ABNORMAL HIGH (ref 0.44–1.00)
Creatinine, Ser: 2.46 mg/dL — ABNORMAL HIGH (ref 0.44–1.00)
GFR calc Af Amer: 23 mL/min — ABNORMAL LOW (ref >60)
GFR calc Af Amer: 24 mL/min — ABNORMAL LOW (ref >60)
GFR calc non Af Amer: 20 mL/min — ABNORMAL LOW (ref >60)
GFR calc non Af Amer: 20 mL/min — ABNORMAL LOW (ref >60)
Glucose, Bld: 97 mg/dL (ref 65–99)
Glucose, Bld: 77 mg/dL (ref 65–99)
Potassium: 3.8 mmol/L (ref 3.5–5.1)
Potassium: 3.8 mmol/L (ref 3.5–5.1)
Sodium: 140 mmol/L (ref 135–145)
Sodium: 140 mmol/L (ref 135–145)

## 2017-10-07 LAB — GLUCOSE, CAPILLARY
Glucose-Capillary: 86 mg/dL (ref 65–99)
Glucose-Capillary: 144 mg/dL — ABNORMAL HIGH (ref 65–99)
Glucose-Capillary: 228 mg/dL — ABNORMAL HIGH (ref 65–99)
Glucose-Capillary: 197 mg/dL — ABNORMAL HIGH (ref 65–99)

## 2017-10-07 LAB — MAGNESIUM: Magnesium: 1.7 mg/dL (ref 1.7–2.4)

## 2017-10-07 MED ORDER — HYDRALAZINE HCL 50 MG PO TABS
50.0000 mg | ORAL_TABLET | Freq: Three times a day (TID) | ORAL | Status: DC
  Administered 2017-10-07 – 2017-10-11 (×13): 50 mg via ORAL
  Filled 2017-10-07 (×13): qty 1

## 2017-10-07 NOTE — Progress Notes (Signed)
[place] [facility] [place] NAME: [name]    MR#:  [id number]  DATE OF BIRTH:  July 29, 1955  SUBJECTIVE:  CHIEF COMPLAINT:   Chief Complaint  Patient presents with  . Shortness of Breath  . Facial Swelling   -  Still complaining of dyspnea on exertion. Very noncompliant with her diet. -On Lasix drip  REVIEW OF SYSTEMS:  Review of Systems  Constitutional: Negative for chills, fever and malaise/fatigue.  HENT: Negative for congestion, hearing loss and nosebleeds.   Eyes: Negative for blurred vision and double vision.  Respiratory: Positive for shortness of breath. Negative for cough and wheezing.   Cardiovascular: Positive for leg swelling. Negative for chest pain and palpitations.  Gastrointestinal: Negative for abdominal pain, constipation, diarrhea, nausea and vomiting.  Genitourinary: Negative for dysuria.  Musculoskeletal: Negative for myalgias.  Neurological: Negative for dizziness, focal weakness, seizures, weakness and headaches.  Psychiatric/Behavioral: Negative for depression.    DRUG ALLERGIES:   Allergies  Allergen Reactions  . Shellfish Allergy Anaphylaxis and Swelling  . Diazepam Other (See Comments)    "felt like out of body experience"  . Morphine And Related Itching    VITALS:  Blood pressure (!) 159/71, pulse 65, temperature 98.7 F (37.1 C), temperature source Oral, resp. rate 18, height 5\' 8"  (1.727 m), weight (!) 142.2 kg (313 lb 9.6 oz), SpO2 97 %.  PHYSICAL EXAMINATION:  Physical Exam  GENERAL:  62 y.o.-year-old obese patient lying in the bed with no acute distress.  EYES: Pupils equal, round, reactive to light and accommodation. No scleral icterus. Extraocular muscles intact.  HEENT: Head atraumatic, normocephalic. Oropharynx and nasopharynx clear.  NECK:  Supple, no jugular venous distention. No thyroid enlargement, no tenderness.  LUNGS: Normal breath sounds bilaterally, no wheezing, rales,rhonchi or  crepitation. No use of accessory muscles of respiration. Decreased bibasilar breath sounds CARDIOVASCULAR: S1, S2 normal. No rubs, or gallops. 2/6 systolic murmur ABDOMEN: Soft, obese, nontender, nondistended. Bowel sounds present. No organomegaly or mass.  EXTREMITIES: No  cyanosis, or clubbing. 2+ pedal edema NEUROLOGIC: Cranial nerves II through XII are intact. Muscle strength 5/5 in all extremities. Sensation intact. Gait not checked.  PSYCHIATRIC: The patient is alert and oriented x 3.  SKIN: No obvious rash, lesion, or ulcer.    LABORATORY PANEL:   CBC Recent Labs  Lab 10/03/17 1500  WBC 6.7  HGB 8.5*  HCT 26.4*  PLT 205   ------------------------------------------------------------------------------------------------------------------  Chemistries  Recent Labs  Lab 10/03/17 1628  10/07/17 0607 10/07/17 0827  NA  --    < > 140 140  K  --    < > 3.8 3.8  CL  --    < > 104 105  CO2  --    < > 23 23  GLUCOSE  --    < > 97 77  BUN  --    < > 100* 99*  CREATININE  --    < > 2.52* 2.46*  CALCIUM  --    < > 8.8* 8.9  MG  --    < > 1.7  --   AST 74*  --   --   --   ALT 74*  --   --   --   ALKPHOS 393*  --   --   --   BILITOT 0.8  --   --   --    < > = values in this interval not displayed.   ------------------------------------------------------------------------------------------------------------------  Cardiac Enzymes Recent Labs  Lab 10/03/17 1500  TROPONINI <0.03   ------------------------------------------------------------------------------------------------------------------  RADIOLOGY:  No results found.  EKG:   Orders placed or performed during the hospital encounter of 10/03/17  . ED EKG within 10 minutes  . ED EKG within 10 minutes  . ED EKG  . ED EKG  . EKG 12-Lead  . EKG 12-Lead    ASSESSMENT AND PLAN:    62 year old female with past medical history significant for chronic diastolic CHF, CAD status post stent, hypertension, morbid  obesity, stage IV CK D, sleep apnea presents to hospital secondary to dyspnea on exertion and weight gain  1. Acute on chronic diastolic CHF exacerbation-  - noncompliant with the diet. Started on Lasix drip -Continue to monitor  -at discharge will need torsemide and as needed metolazone. -Encourage ambulation. Daily weights and dietary restriction. -Right heart catheterization as outpatient   2. Acute on chronic kidney disease stage IV-monitor while on Lasix drip. -Nephrology is following    3. CAD-history of stents in 2018 -[place] cardiology consult. Troponin stable -Continue aspirin, Brilinta, Coreg, Imdur and Lipitor  4. DM- lantus and novolog, SSI  5.  COPD-stable, continue inhalers.  6. DVT prophylaxis- SQ heparin  Ambulate patient   All the records are reviewed and case discussed with Care Management/Social Workerr. Management plans discussed with the patient, family and they are in agreement.  CODE STATUS: Full Code  TOTAL TIME TAKING CARE OF THIS PATIENT: 39 minutes.   POSSIBLE D/C IN 3-4 DAYS, DEPENDING ON CLINICAL CONDITION.   Gladstone Lighter M.D on 10/07/2017 at 9:33 AM  Between 7am to 6pm - Pager - [phone]  After 6pm go to [email or web address] - password [email or web address] [facility]  Office  [phone]  CC: Primary care physician; [name], PA-C

## 2017-10-07 NOTE — Progress Notes (Signed)
[facility]  ROUNDING NOTE   Subjective:  Patient significantly improved. Urine output over the preceding 24 hours was 4.4 L. She states that her breathing has significantly improved.   Objective:  Vital signs in last 24 hours:  Temp:  [97.5 F (36.4 C)-98.7 F (37.1 C)] 98.7 F (37.1 C) (03/28 0800) Pulse Rate:  [64-71] 65 (03/28 0800) Resp:  [18] 18 (03/27 1624) BP: (120-159)/(54-72) 159/71 (03/28 0800) SpO2:  [97 %-100 %] 97 % (03/28 0800) Weight:  [142.2 kg (313 lb 9.6 oz)] 142.2 kg (313 lb 9.6 oz) (03/28 0329)  Weight change: -0.68 kg (-1 lb 8 oz) Filed Weights   10/05/17 0446 10/06/17 0355 10/07/17 0329  Weight: (!) 146.2 kg (322 lb 6.4 oz) (!) 142.9 kg (315 lb 1.6 oz) (!) 142.2 kg (313 lb 9.6 oz)    Intake/Output: I/O last 3 completed shifts: In: 840.8 [P.O.:480; I.V.:360.8] Out: 6500 [Urine:6500]   Intake/Output this shift:  Total I/O In: -  Out: 1150 [Urine:1150]  Physical Exam: General: No acute distress  Head: Normocephalic, atraumatic. Moist oral mucosal membranes  Eyes: Anicteric  Neck: Supple, trachea midline  Lungs:  Minimal rales, normal effort  Heart: S1S2 no rubs  Abdomen:  Soft, nontender, bowel sounds present  Extremities: 1+ peripheral edema.  Neurologic: Awake, alert, following commands  Skin: No lesions  Access:     Basic Metabolic Panel: Recent Labs  Lab 10/04/17 0415 10/05/17 0436 10/06/17 0346 10/07/17 0607 10/07/17 0827  NA 137 137 136 140 140  K 4.3 4.3 4.0 3.8 3.8  CL 105 103 102 104 105  CO2 21* 22 22 23 23   GLUCOSE 301* 264* 288* 97 77  BUN 98* 100* 100* 100* 99*  CREATININE 2.86* 2.89* 2.52* 2.52* 2.46*  CALCIUM 8.4* 8.4* 8.5* 8.8* 8.9  MG  --  2.0  --  1.7  --     Liver Function Tests: Recent Labs  Lab 10/03/17 1628  AST 74*  ALT 74*  ALKPHOS 393*  BILITOT 0.8  PROT 7.3  ALBUMIN 3.5   No results for input(s): LIPASE, AMYLASE in the last 168 hours. No results for input(s): AMMONIA in the  last 168 hours.  CBC: Recent Labs  Lab 10/03/17 1500  WBC 6.7  HGB 8.5*  HCT 26.4*  MCV 85.7  PLT 205    Cardiac Enzymes: Recent Labs  Lab 10/03/17 1500  TROPONINI <0.03    BNP: Invalid input(s): POCBNP  CBG: Recent Labs  Lab 10/06/17 0759 10/06/17 1134 10/06/17 1624 10/06/17 2027 10/07/17 0743  GLUCAP 337* 360* 238* 234* 86    Microbiology: Results for orders placed or performed during the hospital encounter of 09/14/17  MRSA PCR Screening     Status: None   Collection Time: 09/14/17  4:40 PM  Result Value Ref Range Status   MRSA by PCR NEGATIVE NEGATIVE Final    Comment:        The GeneXpert MRSA Assay (FDA approved for NASAL specimens only), is one component of a comprehensive MRSA colonization surveillance program. It is not intended to diagnose MRSA infection nor to guide or monitor treatment for MRSA infections. Performed at [facility], [address] [place], [place] [address]   CULTURE, BLOOD (ROUTINE X 2) w Reflex to ID Panel     Status: None   Collection Time: 09/14/17 11:11 PM  Result Value Ref Range Status   Specimen Description BLOOD LT [facility]  Final   Special Requests   Final  BOTTLES DRAWN AEROBIC AND ANAEROBIC Blood Culture adequate volume   Culture   Final    NO GROWTH 5 DAYS Performed at [facility], [address] [place], [place] [address]    Report Status 09/19/2017 FINAL  Final  CULTURE, BLOOD (ROUTINE X 2) w Reflex to ID Panel     Status: None   Collection Time: 09/14/17 11:37 PM  Result Value Ref Range Status   Specimen Description BLOOD RT ARM  Final   Special Requests   Final    BOTTLES DRAWN AEROBIC AND ANAEROBIC Blood Culture results may not be optimal due to an inadequate volume of blood received in culture bottles   Culture   Final    NO GROWTH 5 DAYS Performed at [facility], [address] [place], [place] [address]    Report Status 09/19/2017 FINAL  Final  Urine Culture      Status: None   Collection Time: 09/15/17  3:18 AM  Result Value Ref Range Status   Specimen Description   Final    URINE, RANDOM Performed at [facility], [address], [place], [place] [address]    Special Requests   Final    NONE Performed at [facility], [address], [place], [place] [address]    Culture   Final    NO GROWTH Performed at [facility], [place] [address] [place], [place] [address]    Report Status 09/16/2017 FINAL  Final    Coagulation Studies: No results for input(s): LABPROT, INR in the last 72 hours.  Urinalysis: No results for input(s): COLORURINE, LABSPEC, PHURINE, GLUCOSEU, HGBUR, BILIRUBINUR, KETONESUR, PROTEINUR, UROBILINOGEN, NITRITE, LEUKOCYTESUR in the last 72 hours.  Invalid input(s): APPERANCEUR    Imaging: No results found.   Medications:   . furosemide (LASIX) infusion 10 mg/hr (10/07/17 0111)   . allopurinol  100 mg Oral Daily  . aspirin EC  81 mg Oral Daily  . atorvastatin  80 mg Oral q1800  . carvedilol  25 mg Oral BID WC  . docusate sodium  100 mg Oral BID  . heparin injection (subcutaneous)  5,000 Units Subcutaneous Q8H  . hydrALAZINE  50 mg Oral Q8H  . insulin aspart  0-5 Units Subcutaneous QHS  . insulin aspart  0-9 Units Subcutaneous TID WC  . insulin aspart  8 Units Subcutaneous TID WC  . insulin glargine  50 Units Subcutaneous BID  . isosorbide mononitrate  90 mg Oral Daily  . mouth rinse  15 mL Mouth Rinse BID  . mometasone-formoterol  2 puff Inhalation BID  . montelukast  10 mg Oral QHS  . pantoprazole  40 mg Oral QAC breakfast  . ticagrelor  90 mg Oral BID  . Vitamin D (Ergocalciferol)  50,000 Units Oral Q30 days   acetaminophen **OR** acetaminophen, albuterol, bisacodyl, docusate sodium, hydrOXYzine, nitroGLYCERIN, ondansetron **OR** ondansetron (ZOFRAN) IV, oxyCODONE-acetaminophen, traZODone  Assessment/ Plan:  62 y.o. female African-American femalewith chronic kidney disease stage  III followed by Dr. Arty Baumgartner, [facility], hypertension, coronary artery disease, congestive heart failure with diastolic dysfunction, diabetes mellitus type II, hepatitis C chronic, peptic ulcer disease, obstructive sleep apnea  1.  Acute renal failure on chronic kidney disease stage III baseline creatinine 1.81/GFR 35 (Aug 05, 2017) 2.  Peripheral edema. 3.  Hypertension. 4.  Pulmonary edema  Plan:  Over the past several days the patient has had excellent urine output on Lasix drip.  Her edema has improved.  Her respiratory status  has also improved.  Renal parameters remain stable despite aggressive diuresis.  Case discussed with cardiology this a.m.  We have agreed to continue Lasix drip for another 24 hours.  She will likely be transitioned to torsemide tomorrow.  Further plan as patient progresses.   LOS: 4 Jeziah Kretschmer 3/28/20199:31 AM

## 2017-10-07 NOTE — Progress Notes (Addendum)
Progress Note  Patient Name: [name] Date of Encounter: 10/07/2017  Primary Cardiologist: Burt Knack  Subjective   SOB much improved. Still SOB with ambulation in her room. Did not get up into chair or walk in the hallway on 3/27. Renal function stable at 2.52, BUN stable at 100. Weight down another 2 pounds (315--313) and down 9 pounds for the past 2 days.   Inpatient Medications    Scheduled Meds: . allopurinol  100 mg Oral Daily  . aspirin EC  81 mg Oral Daily  . atorvastatin  80 mg Oral q1800  . carvedilol  25 mg Oral BID WC  . docusate sodium  100 mg Oral BID  . heparin injection (subcutaneous)  5,000 Units Subcutaneous Q8H  . hydrALAZINE  25 mg Oral Q8H  . insulin aspart  0-5 Units Subcutaneous QHS  . insulin aspart  0-9 Units Subcutaneous TID WC  . insulin aspart  8 Units Subcutaneous TID WC  . insulin glargine  50 Units Subcutaneous BID  . isosorbide mononitrate  90 mg Oral Daily  . mouth rinse  15 mL Mouth Rinse BID  . mometasone-formoterol  2 puff Inhalation BID  . montelukast  10 mg Oral QHS  . pantoprazole  40 mg Oral QAC breakfast  . ticagrelor  90 mg Oral BID  . Vitamin D (Ergocalciferol)  50,000 Units Oral Q30 days   Continuous Infusions: . furosemide (LASIX) infusion 10 mg/hr (10/07/17 0111)   PRN Meds: acetaminophen **OR** acetaminophen, albuterol, bisacodyl, docusate sodium, hydrOXYzine, nitroGLYCERIN, ondansetron **OR** ondansetron (ZOFRAN) IV, oxyCODONE-acetaminophen, traZODone   Vital Signs    Vitals:   10/06/17 0757 10/06/17 1624 10/06/17 1959 10/07/17 0329  BP: (!) 160/68 125/72 120/62 (!) 135/54  Pulse: 75 64 65 71  Resp:  18    Temp: 98.7 F (37.1 C) 98.1 F (36.7 C) (!) 97.5 F (36.4 C) 98.4 F (36.9 C)  TempSrc: Oral Oral Oral Oral  SpO2: 96% 99% 100% 100%  Weight:    (!) 313 lb 9.6 oz (142.2 kg)  Height:        Intake/Output Summary (Last 24 hours) at 10/07/2017 0749 Last data filed at 10/07/2017 0541 Gross per 24 hour    Intake 612.33 ml  Output 4450 ml  Net -3837.67 ml   Filed Weights   10/05/17 0446 10/06/17 0355 10/07/17 0329  Weight: (!) 322 lb 6.4 oz (146.2 kg) (!) 315 lb 1.6 oz (142.9 kg) (!) 313 lb 9.6 oz (142.2 kg)    Telemetry    NSR - Personally Reviewed  ECG    n/a - Personally Reviewed  Physical Exam   GEN: No acute distress.   Neck: No JVD. Cardiac: RRR, no murmurs, rubs, or gallops.  Respiratory: Clear to auscultation bilaterally.  GI: Soft, nontender, mildly distended.   MS: No edema; No deformity. Neuro:  Alert and oriented x 3; Nonfocal.  Psych: Normal affect.  Labs    Chemistry Recent Labs  Lab 10/03/17 1628 10/04/17 0415 10/05/17 0436 10/06/17 0346  NA  --  137 137 136  K  --  4.3 4.3 4.0  CL  --  105 103 102  CO2  --  21* 22 22  GLUCOSE  --  301* 264* 288*  BUN  --  98* 100* 100*  CREATININE  --  2.86* 2.89* 2.52*  CALCIUM  --  8.4* 8.4* 8.5*  PROT 7.3  --   --   --   ALBUMIN 3.5  --   --   --  AST 74*  --   --   --   ALT 74*  --   --   --   ALKPHOS 393*  --   --   --   BILITOT 0.8  --   --   --   GFRNONAA  --  17* 17* 20*  GFRAA  --  20* 19* 23*  ANIONGAP  --  11 12 12      Hematology Recent Labs  Lab 10/03/17 1500  WBC 6.7  RBC 3.08*  HGB 8.5*  HCT 26.4*  MCV 85.7  MCH 27.5  MCHC 32.1  RDW 18.8*  PLT 205    Cardiac Enzymes Recent Labs  Lab 10/03/17 1500  TROPONINI <0.03   No results for input(s): TROPIPOC in the last 168 hours.   BNP Recent Labs  Lab 10/03/17 1628  BNP 147.0*     DDimer No results for input(s): DDIMER in the last 168 hours.   Radiology    No results found.  Cardiac Studies   Echocardiogram: 09/15/2017 Study Conclusions  - Left ventricle: The cavity size was at the upper limits of normal. Wall thickness was increased in a pattern of mild LVH. Systolic function was vigorous. The estimated ejection fraction was in the range of 65% to 70%. Wall motion was normal; there were no regional wall  motion abnormalities. Doppler parameters are consistent with abnormal left ventricular relaxation (grade 1 diastolic dysfunction). Doppler parameters are consistent with high ventricular filling pressure. - Aortic valve: Transvalvular velocity was increased. There was moderate stenosis. There was trivial regurgitation. Peak velocity (S): 308 cm/s. Mean gradient (S): 20 mm Hg. Valve area (VTI): 1.34 cm^2. Valve area (Vmax): 1.11 cm^2. - Ascending aorta: The ascending aorta was mildly dilated. - Mitral valve: Calcified annulus. Mildly thickened leaflets . - Left atrium: The atrium was mildly dilated. - Right ventricle: The cavity size was normal. Systolic function was normal. - Inferior vena cava: The vessel was dilated. The respirophasic diameter changes were blunted (<50%), consistent with elevated central venous pressure. - Pericardium, extracardiac: A trivial pericardial effusion was identified posterior to the heart.  Patient Profile     63 y.o. female with history of chronic diastolic CHF, CAD (s/p DES to Prox-LAD in 09/2016), moderateAS, HTN, HLD, morbid obesity, Stage 4 CKD, and OSAwho is being seen today for the evaluation of acute CHF.  Assessment & Plan    1. Acute on chronic diastolic CHF: -She continues to appear volume up, though improving -Weight increase of 4 pounds overnight -Continue Lasix gtt to 10 mg/hr -Possible transition to torsemide with prn metolazone on 3/29 -Ambulate today -Echo as above -May benefit from [place] as outpatient  -Nephrology on board  2. Acute on CKD stage IV: -Stable -Nephrology on board -Lasix as above -Continue to monitor with diuresis   3. CAD: -No chest pain -Cycle troponin to rule out -ASA, Briltina, Coreg, Imdur, Lipitor -No plans for inpatient ischemic evaluation at this time  4.Aortic stenosis: -Echo as above -Follow up as an outpatient  5. Obesity/OSA: -CPAP  6. Anemia of chronic  disease: -Per IM  7. HTN: -Increase hydralazine to 50 mg tid -Continue to attempt to avoid amlodipine if able -Continue remaining medications    For questions or updates, please contact [place] Please consult [email or web address] for contact info under Cardiology/STEMI.    Signed, Christell Faith, PA-C [place] Pager: [phone] 10/07/2017, 7:49 AM   Attending Note Patient seen and examined, agree with detailed note above,  Patient  presentation and plan discussed on rounds.   Dramatic urine output in the past 24 hours Notes indicating 3.8 L -+800 this morning Reports continued shortness of breath Drowsy this morning, unable to wake up for good conversation Fumbling to answer the phone  On physical exam unable to estimate JVP supine plus obese, large neck Lungs with crackles at the bases heart sounds regular normal S1-S2, distant Obese nontender, still with trace to 1+ pitting lower extremity edema to at least below the knees if not the thighs  Lab work reviewed showing improved renal function creatinine down to 2.46 previously 2.52, potassium 3.8 sodium 140  A/P 1. Acute on chronic diastolic CHF: Still volume up ,  Dramatic urine output, slow improvement  Continue Lasix gtt to 10 mg/hr Will likely need a few more days of diuresis At discharge will need torsemide 40 twice daily Situation worse in the setting of untreated sleep apnea  2. Acute on CKD stage IV: Nephrology following Lasix infusion Renal function stable to improving with diuresis  3. CAD: ASA, Briltina, Coreg, Imdur, Lipitor  4.Aortic stenosis: Mean gradient of 20, outpt follow up Not severe  5. Obesity/OSA: Not on CPAP Needs repeat sleep study, Then needs charity to help purchase a CPAP Ideally to decrease risk of readmission would be nice to have this done as inpatient -----Consider getting pulmonary involved as inpatient now to see if they have any suggestions.  barely able to  get her awake this morning for exam.  CO2 retaining?  Need BiPAP?  6. Anemia of chronic disease: Secondary to CRI  7. HTN: -Continue remaining medications Blood pressure up and down  Greater than 50% was spent in counseling and coordination of care with patient Total encounter time 25 minutes or more   Signed: Esmond Plants  M.D., Ph.D. [facility]

## 2017-10-08 ENCOUNTER — Other Ambulatory Visit: Payer: Self-pay | Admitting: Physician Assistant

## 2017-10-08 DIAGNOSIS — N171 Acute kidney failure with acute cortical necrosis: Secondary | ICD-10-CM

## 2017-10-08 LAB — BASIC METABOLIC PANEL
Anion gap: 13 (ref 5–15)
BUN: 100 mg/dL — ABNORMAL HIGH (ref 6–20)
CO2: 24 mmol/L (ref 22–32)
Calcium: 9 mg/dL (ref 8.9–10.3)
Chloride: 103 mmol/L (ref 101–111)
Creatinine, Ser: 2.39 mg/dL — ABNORMAL HIGH (ref 0.44–1.00)
GFR calc Af Amer: 24 mL/min — ABNORMAL LOW (ref >60)
GFR calc non Af Amer: 21 mL/min — ABNORMAL LOW (ref >60)
Glucose, Bld: 143 mg/dL — ABNORMAL HIGH (ref 65–99)
Potassium: 3.8 mmol/L (ref 3.5–5.1)
Sodium: 140 mmol/L (ref 135–145)

## 2017-10-08 LAB — GLUCOSE, CAPILLARY
Glucose-Capillary: 116 mg/dL — ABNORMAL HIGH (ref 65–99)
Glucose-Capillary: 125 mg/dL — ABNORMAL HIGH (ref 65–99)
Glucose-Capillary: 124 mg/dL — ABNORMAL HIGH (ref 65–99)
Glucose-Capillary: 156 mg/dL — ABNORMAL HIGH (ref 65–99)

## 2017-10-08 MED ORDER — FUROSEMIDE 10 MG/ML IJ SOLN
10.0000 mg/h | INTRAVENOUS | Status: AC
  Administered 2017-10-08 – 2017-10-10 (×4): 10 mg/h via INTRAVENOUS
  Filled 2017-10-08 (×2): qty 25

## 2017-10-08 MED ORDER — SODIUM CHLORIDE 0.9% FLUSH
3.0000 mL | Freq: Two times a day (BID) | INTRAVENOUS | Status: DC
  Administered 2017-10-08 – 2017-10-11 (×5): 3 mL via INTRAVENOUS

## 2017-10-08 NOTE — Progress Notes (Signed)
Error

## 2017-10-08 NOTE — Progress Notes (Signed)
[facility]  ROUNDING NOTE   Subjective:  Patient has had good diuresis over the past several days. Maintained on a Lasix drip. Case discussed with cardiology this a.m.   Objective:  Vital signs in last 24 hours:  Temp:  [97.5 F (36.4 C)-98.2 F (36.8 C)] 98.2 F (36.8 C) (03/29 0406) Pulse Rate:  [60-73] 68 (03/29 0925) Resp:  [14-18] 14 (03/29 0925) BP: (110-151)/(54-60) 151/58 (03/29 0925) SpO2:  [96 %-98 %] 98 % (03/29 0925) Weight:  [141 kg (310 lb 14.4 oz)] 141 kg (310 lb 14.4 oz) (03/29 0406)  Weight change: -1.225 kg (-2 lb 11.2 oz) Filed Weights   10/06/17 0355 10/07/17 0329 10/08/17 0406  Weight: (!) 142.9 kg (315 lb 1.6 oz) (!) 142.2 kg (313 lb 9.6 oz) (!) 141 kg (310 lb 14.4 oz)    Intake/Output: I/O last 3 completed shifts: In: 444.3 [P.O.:360; I.V.:84.3] Out: 5300 [Urine:5300]   Intake/Output this shift:  Total I/O In: 240 [P.O.:240] Out: 2650 [Urine:2650]  Physical Exam: General: No acute distress  Head: Normocephalic, atraumatic. Moist oral mucosal membranes  Eyes: Anicteric  Neck: Supple, trachea midline  Lungs:  Minimal rales, normal effort  Heart: S1S2 no rubs  Abdomen:  Soft, nontender, bowel sounds present  Extremities: 1+ peripheral edema.  Neurologic: Awake, alert, following commands  Skin: No lesions  Access:     Basic Metabolic Panel: Recent Labs  Lab 10/05/17 0436 10/06/17 0346 10/07/17 0607 10/07/17 0827 10/08/17 0637  NA 137 136 140 140 140  K 4.3 4.0 3.8 3.8 3.8  CL 103 102 104 105 103  CO2 22 22 23 23 24   GLUCOSE 264* 288* 97 77 143*  BUN 100* 100* 100* 99* 100*  CREATININE 2.89* 2.52* 2.52* 2.46* 2.39*  CALCIUM 8.4* 8.5* 8.8* 8.9 9.0  MG 2.0  --  1.7  --   --     Liver Function Tests: Recent Labs  Lab 10/03/17 1628  AST 74*  ALT 74*  ALKPHOS 393*  BILITOT 0.8  PROT 7.3  ALBUMIN 3.5   No results for input(s): LIPASE, AMYLASE in the last 168 hours. No results for input(s): AMMONIA in the last  168 hours.  CBC: Recent Labs  Lab 10/03/17 1500  WBC 6.7  HGB 8.5*  HCT 26.4*  MCV 85.7  PLT 205    Cardiac Enzymes: Recent Labs  Lab 10/03/17 1500  TROPONINI <0.03    BNP: Invalid input(s): POCBNP  CBG: Recent Labs  Lab 10/07/17 0743 10/07/17 1133 10/07/17 1623 10/07/17 2048 10/08/17 0808  GLUCAP 86 144* 228* 197* 116*    Microbiology: Results for orders placed or performed during the hospital encounter of 09/14/17  MRSA PCR Screening     Status: None   Collection Time: 09/14/17  4:40 PM  Result Value Ref Range Status   MRSA by PCR NEGATIVE NEGATIVE Final    Comment:        The GeneXpert MRSA Assay (FDA approved for NASAL specimens only), is one component of a comprehensive MRSA colonization surveillance program. It is not intended to diagnose MRSA infection nor to guide or monitor treatment for MRSA infections. Performed at [facility], [address] [place], [place] [address]   CULTURE, BLOOD (ROUTINE X 2) w Reflex to ID Panel     Status: None   Collection Time: 09/14/17 11:11 PM  Result Value Ref Range Status   Specimen Description BLOOD LT [facility]  Final   Special Requests   Final  BOTTLES DRAWN AEROBIC AND ANAEROBIC Blood Culture adequate volume   Culture   Final    NO GROWTH 5 DAYS Performed at [facility], [address] [place], [place] [address]    Report Status 09/19/2017 FINAL  Final  CULTURE, BLOOD (ROUTINE X 2) w Reflex to ID Panel     Status: None   Collection Time: 09/14/17 11:37 PM  Result Value Ref Range Status   Specimen Description BLOOD RT ARM  Final   Special Requests   Final    BOTTLES DRAWN AEROBIC AND ANAEROBIC Blood Culture results may not be optimal due to an inadequate volume of blood received in culture bottles   Culture   Final    NO GROWTH 5 DAYS Performed at [facility], [address] [place], [place] [address]    Report Status 09/19/2017 FINAL  Final  Urine Culture     Status:  None   Collection Time: 09/15/17  3:18 AM  Result Value Ref Range Status   Specimen Description   Final    URINE, RANDOM Performed at [facility], [address], [place], [place] [address]    Special Requests   Final    NONE Performed at [facility], [address], [place], [place] [address]    Culture   Final    NO GROWTH Performed at [facility], [place], [place], [place] [address]    Report Status 09/16/2017 FINAL  Final    Coagulation Studies: No results for input(s): LABPROT, INR in the last 72 hours.  Urinalysis: No results for input(s): COLORURINE, LABSPEC, PHURINE, GLUCOSEU, HGBUR, BILIRUBINUR, KETONESUR, PROTEINUR, UROBILINOGEN, NITRITE, LEUKOCYTESUR in the last 72 hours.  Invalid input(s): APPERANCEUR    Imaging: No results found.   Medications:    . allopurinol  100 mg Oral Daily  . aspirin EC  81 mg Oral Daily  . atorvastatin  80 mg Oral q1800  . carvedilol  25 mg Oral BID WC  . docusate sodium  100 mg Oral BID  . heparin injection (subcutaneous)  5,000 Units Subcutaneous Q8H  . hydrALAZINE  50 mg Oral Q8H  . insulin aspart  0-5 Units Subcutaneous QHS  . insulin aspart  0-9 Units Subcutaneous TID WC  . insulin aspart  8 Units Subcutaneous TID WC  . insulin glargine  50 Units Subcutaneous BID  . isosorbide mononitrate  90 mg Oral Daily  . mouth rinse  15 mL Mouth Rinse BID  . mometasone-formoterol  2 puff Inhalation BID  . montelukast  10 mg Oral QHS  . pantoprazole  40 mg Oral QAC breakfast  . sodium chloride flush  3 mL Intravenous Q12H  . ticagrelor  90 mg Oral BID  . Vitamin D (Ergocalciferol)  50,000 Units Oral Q30 days   acetaminophen **OR** acetaminophen, albuterol, bisacodyl, docusate sodium, hydrOXYzine, nitroGLYCERIN, ondansetron **OR** ondansetron (ZOFRAN) IV, oxyCODONE-acetaminophen, traZODone  Assessment/ Plan:  62 y.o. female African-American femalewith chronic kidney disease stage III followed by  Dr. Arty Baumgartner, [facility], hypertension, coronary artery disease, congestive heart failure with diastolic dysfunction, diabetes mellitus type II, hepatitis C chronic, peptic ulcer disease, obstructive sleep apnea  1.  Acute renal failure on chronic kidney disease stage III baseline creatinine 1.81/GFR 35 (Aug 05, 2017) 2.  Peripheral edema. 3.  Hypertension. 4.  Pulmonary edema  Plan:  Case discussed extensively with cardiology today.  They prefer that we continue Lasix drip at this time and try to get the patient  to a better dry weight.  Therefore at this time we will maintain the patient on Lasix drip 10 mg IV per hour.  Her renal function has remained relatively stable throughout all this but creatinine still remains above her higher baseline in January.  The plan will be to transition her to oral diuretics hopefully within the next several days.  We will continue to monitor the patient's progress.   LOS: 5 [place] 3/29/201911:27 AM

## 2017-10-08 NOTE — Progress Notes (Addendum)
Progress Note  Patient Name: [name] Date of Encounter: 10/08/2017  Primary Cardiologist: Burt Knack  Subjective   SOB continues to improve. No chest pain. Remains somnolent. Weight down another 3 pounds on Lasix gtt at 10 mg/hr. Renal function improving SCr 2.46-->2.39.   Inpatient Medications    Scheduled Meds: . allopurinol  100 mg Oral Daily  . aspirin EC  81 mg Oral Daily  . atorvastatin  80 mg Oral q1800  . carvedilol  25 mg Oral BID WC  . docusate sodium  100 mg Oral BID  . heparin injection (subcutaneous)  5,000 Units Subcutaneous Q8H  . hydrALAZINE  50 mg Oral Q8H  . insulin aspart  0-5 Units Subcutaneous QHS  . insulin aspart  0-9 Units Subcutaneous TID WC  . insulin aspart  8 Units Subcutaneous TID WC  . insulin glargine  50 Units Subcutaneous BID  . isosorbide mononitrate  90 mg Oral Daily  . mouth rinse  15 mL Mouth Rinse BID  . mometasone-formoterol  2 puff Inhalation BID  . montelukast  10 mg Oral QHS  . pantoprazole  40 mg Oral QAC breakfast  . sodium chloride flush  3 mL Intravenous Q12H  . ticagrelor  90 mg Oral BID  . Vitamin D (Ergocalciferol)  50,000 Units Oral Q30 days   Continuous Infusions: . furosemide (LASIX) infusion 10 mg/hr (10/07/17 0111)   PRN Meds: acetaminophen **OR** acetaminophen, albuterol, bisacodyl, docusate sodium, hydrOXYzine, nitroGLYCERIN, ondansetron **OR** ondansetron (ZOFRAN) IV, oxyCODONE-acetaminophen, traZODone   Vital Signs    Vitals:   10/07/17 0800 10/07/17 1715 10/07/17 1933 10/08/17 0406  BP: (!) 159/71 (!) 142/54 (!) 110/56 134/60  Pulse: 65 60 66 73  Resp:  16 18 17   Temp: 98.7 F (37.1 C)  (!) 97.5 F (36.4 C) 98.2 F (36.8 C)  TempSrc: Oral  Oral Oral  SpO2: 97%  97% 96%  Weight:    (!) 310 lb 14.4 oz (141 kg)  Height:        Intake/Output Summary (Last 24 hours) at 10/08/2017 0741 Last data filed at 10/07/2017 2100 Gross per 24 hour  Intake 360 ml  Output 3050 ml  Net -2690 ml   Filed  Weights   10/06/17 0355 10/07/17 0329 10/08/17 0406  Weight: (!) 315 lb 1.6 oz (142.9 kg) (!) 313 lb 9.6 oz (142.2 kg) (!) 310 lb 14.4 oz (141 kg)    Telemetry    NSR - Personally Reviewed  ECG    n/a - Personally Reviewed  Physical Exam   GEN: No acute distress.   Neck: No JVD. Cardiac: RRR, no murmurs, rubs, or gallops.  Respiratory: Improved breath sounds bilaterally.  GI: Soft, nontender, mildly distended.   MS: Trace bilateral LE edema; No deformity. Neuro:  Alert and oriented x 3; Nonfocal.  Psych: Normal affect.  Labs    Chemistry Recent Labs  Lab 10/03/17 1628  10/07/17 5427 10/07/17 0827 10/08/17 0637  NA  --    < > 140 140 140  K  --    < > 3.8 3.8 3.8  CL  --    < > 104 105 103  CO2  --    < > 23 23 24   GLUCOSE  --    < > 97 77 143*  BUN  --    < > 100* 99* 100*  CREATININE  --    < > 2.52* 2.46* 2.39*  CALCIUM  --    < > 8.8*  8.9 9.0  PROT 7.3  --   --   --   --   ALBUMIN 3.5  --   --   --   --   AST 74*  --   --   --   --   ALT 74*  --   --   --   --   ALKPHOS 393*  --   --   --   --   BILITOT 0.8  --   --   --   --   GFRNONAA  --    < > 20* 20* 21*  GFRAA  --    < > 23* 24* 24*  ANIONGAP  --    < > 13 12 13    < > = values in this interval not displayed.     Hematology Recent Labs  Lab 10/03/17 1500  WBC 6.7  RBC 3.08*  HGB 8.5*  HCT 26.4*  MCV 85.7  MCH 27.5  MCHC 32.1  RDW 18.8*  PLT 205    Cardiac Enzymes Recent Labs  Lab 10/03/17 1500  TROPONINI <0.03   No results for input(s): TROPIPOC in the last 168 hours.   BNP Recent Labs  Lab 10/03/17 1628  BNP 147.0*     DDimer No results for input(s): DDIMER in the last 168 hours.   Radiology    No results found.  Cardiac Studies   Echocardiogram: 09/15/2017 Study Conclusions  - Left ventricle: The cavity size was at the upper limits of normal. Wall thickness was increased in a pattern of mild LVH. Systolic function was vigorous. The estimated ejection  fraction was in the range of 65% to 70%. Wall motion was normal; there were no regional wall motion abnormalities. Doppler parameters are consistent with abnormal left ventricular relaxation (grade 1 diastolic dysfunction). Doppler parameters are consistent with high ventricular filling pressure. - Aortic valve: Transvalvular velocity was increased. There was moderate stenosis. There was trivial regurgitation. Peak velocity (S): 308 cm/s. Mean gradient (S): 20 mm Hg. Valve area (VTI): 1.34 cm^2. Valve area (Vmax): 1.11 cm^2. - Ascending aorta: The ascending aorta was mildly dilated. - Mitral valve: Calcified annulus. Mildly thickened leaflets . - Left atrium: The atrium was mildly dilated. - Right ventricle: The cavity size was normal. Systolic function was normal. - Inferior vena cava: The vessel was dilated. The respirophasic diameter changes were blunted (<50%), consistent with elevated central venous pressure. - Pericardium, extracardiac: A trivial pericardial effusion was identified posterior to the heart.  Patient Profile     62 y.o. female with history of chronic diastolic CHF, CAD (s/p DES to Prox-LAD in 09/2016), moderateAS, HTN, HLD, morbid obesity, Stage 4 CKD, and OSAwho is being seen today for the evaluation of acute CHF.  Assessment & Plan    1. Acute on chronic diastolic CHF: -She continues to appear volume up, though improving -Weight down 3 pounds over the past 24 hours -ContinueLasix gtt to 10 mg/hr as renal function continues to improve with diuresis  At discharge, she will likely be placed to torsemide with prn metolazone -Ambulate today -Echo as above -May benefit from [facility] [address] [place] as outpatient -Nephrology on board  2. Acute on CKD stage IV: -Improving with diuresis  -Nephrology on board -Lasix as above -Continue to monitor with diuresis  3. CAD: -No chest pain -Cycle troponin to rule out -ASA, Briltina, Coreg, Imdur,  Lipitor -No plans for inpatient ischemic evaluation at this time  4.Aortic stenosis: -Echo as above -Follow up as  an outpatient  5. Obesity/OSA/possible OHS: -Consulted pulmonology   6. Anemia ofchronic disease: -Per IM  7. HTN: -Improving  -Continue hydralazine to 50 mg tid -Continue to attempt to avoid amlodipine if able -Continue remaining medications    For questions or updates, please contact [place] Please consult [email or web address] for contact info under Cardiology/STEMI.    Signed, Christell Faith, PA-C [place] Pager: [phone] 10/08/2017, 7:41 AM   Attending Note Patient seen and examined, agree with detailed note above,  Patient presentation and plan discussed on rounds.   Starting to feel better after continued aggressive diuresis Still with significant thigh edema, lower extremity pitting very tender to palpation Does not feel that she is at her baseline Has not been walking very much, only sitting in a chair for short periods of time  On physical exam morbidly obese laying supine not on oxygen unable to estimate JVP given girth of neck, lungs clear to auscultation scant Rales at the bases, heart sounds regular normal S1-S2 2/6 SEM RSB, abdomen obese soft nontender tender trace pitting edema lower extremities to below the knees  Lab work reviewed showing improved creatinine 2.39 BUN 100 sodium 140 potassium 3.8   A/P 1.Acute on chronic diastolic CHF: Still volume up ,  Slow steady improvement clinically and renal function Suspect cardiorenal syndrome ContinueLasix gtt to 10 mg/hr Suspect she will need through the weekend with Lasix infusion Will likely need a few more days of diuresis At discharge will need torsemide 40 twice daily Situation worse in the setting of untreated sleep apnea  2. Acute on CKD stage IV: Nephrologyfollowing Lasixinfusion Renal function  improving with diuresis , case discussed with Dr. Holley Raring  3.  CAD: ASA, Briltina, Coreg, Imdur, Lipitor  4.Aortic stenosis: Mean gradient of 20, outpt follow up Not severe.  Murmur appreciated on exam  5. Obesity/OSA: Not on CPAP Needs repeat sleep study, Then needs charity to help purchase a CPAP (can be brought through [employer], refurbished machine (sleep apnea.org)  6. Anemia ofchronic disease: Secondary to CRI  7. HTN: -Continue remaining medications Blood pressure up and down Slow steady improvement with diuresis  Case discussed at length with Dr. Holley Raring Greater than 50% was spent in counseling and coordination of care with patient Total encounter time 35 minutes or more   Signed: Esmond Plants  M.D., Ph.D. [facility]

## 2017-10-08 NOTE — Progress Notes (Signed)
[place] [facility] [place] NAME: [name]    MR#:  [id number]  DATE OF BIRTH:  1955-09-16  SUBJECTIVE:  CHIEF COMPLAINT:   Chief Complaint  Patient presents with  . Shortness of Breath  . Facial Swelling   -  Feels better today, ambulating well - some dyspnea on exertion still present - remains on lasix drip  REVIEW OF SYSTEMS:  Review of Systems  Constitutional: Negative for chills, fever and malaise/fatigue.  HENT: Negative for congestion, hearing loss and nosebleeds.   Eyes: Negative for blurred vision and double vision.  Respiratory: Positive for shortness of breath. Negative for cough and wheezing.   Cardiovascular: Positive for leg swelling. Negative for chest pain and palpitations.  Gastrointestinal: Negative for abdominal pain, constipation, diarrhea, nausea and vomiting.  Genitourinary: Negative for dysuria.  Musculoskeletal: Negative for myalgias.  Neurological: Negative for dizziness, focal weakness, seizures, weakness and headaches.  Psychiatric/Behavioral: Negative for depression.    DRUG ALLERGIES:   Allergies  Allergen Reactions  . Shellfish Allergy Anaphylaxis and Swelling  . Diazepam Other (See Comments)    "felt like out of body experience"  . Morphine And Related Itching    VITALS:  Blood pressure (!) 151/58, pulse 68, temperature 98.2 F (36.8 C), temperature source Oral, resp. rate 14, height 5\' 8"  (1.727 m), weight (!) 141 kg (310 lb 14.4 oz), SpO2 98 %.  PHYSICAL EXAMINATION:  Physical Exam  GENERAL:  62 y.o.-year-old obese patient lying in the bed with no acute distress.  EYES: Pupils equal, round, reactive to light and accommodation. No scleral icterus. Extraocular muscles intact.  HEENT: Head atraumatic, normocephalic. Oropharynx and nasopharynx clear.  NECK:  Supple, no jugular venous distention. No thyroid enlargement, no tenderness.  LUNGS: Normal breath sounds bilaterally, no wheezing,  rales,rhonchi or crepitation. No use of accessory muscles of respiration. Decreased bibasilar breath sounds CARDIOVASCULAR: S1, S2 normal. No rubs, or gallops. 2/6 systolic murmur ABDOMEN: Soft, obese, nontender, nondistended. Bowel sounds present. No organomegaly or mass.  EXTREMITIES: No  cyanosis, or clubbing. 2+ pedal edema NEUROLOGIC: Cranial nerves II through XII are intact. Muscle strength 5/5 in all extremities. Sensation intact. Gait not checked.  PSYCHIATRIC: The patient is alert and oriented x 3.  SKIN: No obvious rash, lesion, or ulcer.    LABORATORY PANEL:   CBC Recent Labs  Lab 10/03/17 1500  WBC 6.7  HGB 8.5*  HCT 26.4*  PLT 205   ------------------------------------------------------------------------------------------------------------------  Chemistries  Recent Labs  Lab 10/03/17 1628  10/07/17 0607  10/08/17 0637  NA  --    < > 140   < > 140  K  --    < > 3.8   < > 3.8  CL  --    < > 104   < > 103  CO2  --    < > 23   < > 24  GLUCOSE  --    < > 97   < > 143*  BUN  --    < > 100*   < > 100*  CREATININE  --    < > 2.52*   < > 2.39*  CALCIUM  --    < > 8.8*   < > 9.0  MG  --    < > 1.7  --   --   AST 74*  --   --   --   --   ALT 74*  --   --   --   --  ALKPHOS 393*  --   --   --   --   BILITOT 0.8  --   --   --   --    < > = values in this interval not displayed.   ------------------------------------------------------------------------------------------------------------------  Cardiac Enzymes Recent Labs  Lab 10/03/17 1500  TROPONINI <0.03   ------------------------------------------------------------------------------------------------------------------  RADIOLOGY:  No results found.  EKG:   Orders placed or performed during the hospital encounter of 10/03/17  . ED EKG within 10 minutes  . ED EKG within 10 minutes  . ED EKG  . ED EKG  . EKG 12-Lead  . EKG 12-Lead    ASSESSMENT AND PLAN:    62 year old female with past medical  history significant for chronic diastolic CHF, CAD status post stent, hypertension, morbid obesity, stage IV CK D, sleep apnea presents to hospital secondary to dyspnea on exertion and weight gain  1. Acute on chronic diastolic CHF exacerbation-  - noncompliant with the diet. Continue on Lasix drip for another day or two per cardiology -Continue to monitor  -at discharge need torsemide bid  and as needed metolazone or weekly metolazone. -Encourage ambulation. Daily weights and dietary restriction. -Right heart catheterization as outpatient - off oxygen   2. Acute on chronic kidney disease stage IV-monitor while on Lasix drip. -Nephrology is following  - Cr stable to improving- BUN still elevated   3. CAD-history of stents in 2018 -[place] cardiology consult. Troponin stable -Continue aspirin, Brilinta, Coreg, Imdur and Lipitor  4. DM- lantus and novolog, SSI  5.  COPD-stable, continue inhalers.  6. DVT prophylaxis- SQ heparin   Patient has been ambulating well   All the records are reviewed and case discussed with Care Management/Social Workerr. Management plans discussed with the patient, family and they are in agreement.  CODE STATUS: Full Code  TOTAL TIME TAKING CARE OF THIS PATIENT: 39 minutes.   POSSIBLE D/C IN 3-4 DAYS, DEPENDING ON CLINICAL CONDITION.   Gladstone Lighter M.D on 10/08/2017 at 12:12 PM  Between 7am to 6pm - Pager - [phone]  After 6pm go to [email or web address] - password [email or web address] [facility]  Office  [phone]  CC: Primary care physician; [name], PA-C

## 2017-10-08 NOTE — Progress Notes (Signed)
PT Cancellation Note  Patient Details Name: VERNON ARIEL MRN: [id number] DOB: 05-10-1956   Cancelled Treatment:     Pt has been up and walking around ad lib in hallway w/o assist, w/o O2.  Per nurse she has been up and safe w/o issues.  Pt not interested in HHPT, seems to be safe and getting regular activity here in the hospital.  PT will sign off, no needs.   Kreg Shropshire, DPT 10/08/2017, 4:00 PM

## 2017-10-08 NOTE — Plan of Care (Signed)

## 2017-10-09 LAB — CBC
HCT: 29.7 % — ABNORMAL LOW (ref 35.0–47.0)
Hemoglobin: 9.5 g/dL — ABNORMAL LOW (ref 12.0–16.0)
MCH: 27 pg (ref 26.0–34.0)
MCHC: 32.2 g/dL (ref 32.0–36.0)
MCV: 84.1 fL (ref 80.0–100.0)
Platelets: 257 10*3/uL (ref 150–440)
RBC: 3.53 MIL/uL — ABNORMAL LOW (ref 3.80–5.20)
RDW: 18.6 % — ABNORMAL HIGH (ref 11.5–14.5)
WBC: 7.7 10*3/uL (ref 3.6–11.0)

## 2017-10-09 LAB — BASIC METABOLIC PANEL
Anion gap: 13 (ref 5–15)
BUN: 100 mg/dL — ABNORMAL HIGH (ref 6–20)
CO2: 23 mmol/L (ref 22–32)
Calcium: 8.7 mg/dL — ABNORMAL LOW (ref 8.9–10.3)
Chloride: 103 mmol/L (ref 101–111)
Creatinine, Ser: 2.79 mg/dL — ABNORMAL HIGH (ref 0.44–1.00)
GFR calc Af Amer: 20 mL/min — ABNORMAL LOW (ref >60)
GFR calc non Af Amer: 17 mL/min — ABNORMAL LOW (ref >60)
Glucose, Bld: 205 mg/dL — ABNORMAL HIGH (ref 65–99)
Potassium: 3.5 mmol/L (ref 3.5–5.1)
Sodium: 139 mmol/L (ref 135–145)

## 2017-10-09 LAB — GLUCOSE, CAPILLARY
Glucose-Capillary: 202 mg/dL — ABNORMAL HIGH (ref 65–99)
Glucose-Capillary: 136 mg/dL — ABNORMAL HIGH (ref 65–99)
Glucose-Capillary: 181 mg/dL — ABNORMAL HIGH (ref 65–99)
Glucose-Capillary: 149 mg/dL — ABNORMAL HIGH (ref 65–99)

## 2017-10-09 NOTE — Progress Notes (Signed)
[facility]  ROUNDING NOTE   Subjective:  Urine output yesterday was improved at 5.3 L. Remains on Lasix drip. Creatinine up slightly to 2.7.   Objective:  Vital signs in last 24 hours:  Temp:  [97.8 F (36.6 C)-98.5 F (36.9 C)] 97.8 F (36.6 C) (03/30 0812) Pulse Rate:  [64-80] 70 (03/30 1140) Resp:  [16-18] 18 (03/30 0812) BP: (131-152)/(50-62) 139/50 (03/30 1140) SpO2:  [96 %-100 %] 97 % (03/30 0812) Weight:  [136.1 kg (300 lb 1.6 oz)] 136.1 kg (300 lb 1.6 oz) (03/30 0547)  Weight change: -4.899 kg (-10 lb 12.8 oz) Filed Weights   10/07/17 0329 10/08/17 0406 10/09/17 0547  Weight: (!) 142.2 kg (313 lb 9.6 oz) (!) 141 kg (310 lb 14.4 oz) (!) 136.1 kg (300 lb 1.6 oz)    Intake/Output: I/O last 3 completed shifts: In: 240 [P.O.:240] Out: 5350 [Urine:5350]   Intake/Output this shift:  No intake/output data recorded.  Physical Exam: General: No acute distress  Head: Normocephalic, atraumatic. Moist oral mucosal membranes  Eyes: Anicteric  Neck: Supple, trachea midline  Lungs:  CTAB, normal effort  Heart: S1S2 no rubs 2/6 SEM  Abdomen:  Soft, nontender, bowel sounds present  Extremities: 1+ peripheral edema.  Neurologic: Awake, alert, following commands  Skin: No lesions  Access:     Basic Metabolic Panel: Recent Labs  Lab 10/05/17 0436 10/06/17 0346 10/07/17 0607 10/07/17 0827 10/08/17 0637 10/09/17 0700  NA 137 136 140 140 140 139  K 4.3 4.0 3.8 3.8 3.8 3.5  CL 103 102 104 105 103 103  CO2 22 22 23 23 24 23   GLUCOSE 264* 288* 97 77 143* 205*  BUN 100* 100* 100* 99* 100* 100*  CREATININE 2.89* 2.52* 2.52* 2.46* 2.39* 2.79*  CALCIUM 8.4* 8.5* 8.8* 8.9 9.0 8.7*  MG 2.0  --  1.7  --   --   --     Liver Function Tests: Recent Labs  Lab 10/03/17 1628  AST 74*  ALT 74*  ALKPHOS 393*  BILITOT 0.8  PROT 7.3  ALBUMIN 3.5   No results for input(s): LIPASE, AMYLASE in the last 168 hours. No results for input(s): AMMONIA in the last  168 hours.  CBC: Recent Labs  Lab 10/03/17 1500 10/09/17 0700  WBC 6.7 7.7  HGB 8.5* 9.5*  HCT 26.4* 29.7*  MCV 85.7 84.1  PLT 205 257    Cardiac Enzymes: Recent Labs  Lab 10/03/17 1500  TROPONINI <0.03    BNP: Invalid input(s): POCBNP  CBG: Recent Labs  Lab 10/08/17 1150 10/08/17 1708 10/08/17 2128 10/09/17 0809 10/09/17 1133  GLUCAP 125* 124* 156* 202* 136*    Microbiology: Results for orders placed or performed during the hospital encounter of 09/14/17  MRSA PCR Screening     Status: None   Collection Time: 09/14/17  4:40 PM  Result Value Ref Range Status   MRSA by PCR NEGATIVE NEGATIVE Final    Comment:        The GeneXpert MRSA Assay (FDA approved for NASAL specimens only), is one component of a comprehensive MRSA colonization surveillance program. It is not intended to diagnose MRSA infection nor to guide or monitor treatment for MRSA infections. Performed at [facility], [address] [place], [place] [address]   CULTURE, BLOOD (ROUTINE X 2) w Reflex to ID Panel     Status: None   Collection Time: 09/14/17 11:11 PM  Result Value Ref Range Status   Specimen Description BLOOD LT  [facility]  Final   Special Requests   Final    BOTTLES DRAWN AEROBIC AND ANAEROBIC Blood Culture adequate volume   Culture   Final    NO GROWTH 5 DAYS Performed at [facility], [address] [place], [place] [address]    Report Status 09/19/2017 FINAL  Final  CULTURE, BLOOD (ROUTINE X 2) w Reflex to ID Panel     Status: None   Collection Time: 09/14/17 11:37 PM  Result Value Ref Range Status   Specimen Description BLOOD RT ARM  Final   Special Requests   Final    BOTTLES DRAWN AEROBIC AND ANAEROBIC Blood Culture results may not be optimal due to an inadequate volume of blood received in culture bottles   Culture   Final    NO GROWTH 5 DAYS Performed at [facility], [address] [place], [place] [address]    Report Status 09/19/2017  FINAL  Final  Urine Culture     Status: None   Collection Time: 09/15/17  3:18 AM  Result Value Ref Range Status   Specimen Description   Final    URINE, RANDOM Performed at [facility], [address], [place], [place] [address]    Special Requests   Final    NONE Performed at [facility], [address], [place], [place] [address]    Culture   Final    NO GROWTH Performed at [facility], [place] [address] [place], [place] [address]    Report Status 09/16/2017 FINAL  Final    Coagulation Studies: No results for input(s): LABPROT, INR in the last 72 hours.  Urinalysis: No results for input(s): COLORURINE, LABSPEC, PHURINE, GLUCOSEU, HGBUR, BILIRUBINUR, KETONESUR, PROTEINUR, UROBILINOGEN, NITRITE, LEUKOCYTESUR in the last 72 hours.  Invalid input(s): APPERANCEUR    Imaging: No results found.   Medications:   . furosemide (LASIX) infusion 10 mg/hr (10/08/17 1820)   . allopurinol  100 mg Oral Daily  . aspirin EC  81 mg Oral Daily  . atorvastatin  80 mg Oral q1800  . carvedilol  25 mg Oral BID WC  . docusate sodium  100 mg Oral BID  . heparin injection (subcutaneous)  5,000 Units Subcutaneous Q8H  . hydrALAZINE  50 mg Oral Q8H  . insulin aspart  0-5 Units Subcutaneous QHS  . insulin aspart  0-9 Units Subcutaneous TID WC  . insulin aspart  8 Units Subcutaneous TID WC  . insulin glargine  50 Units Subcutaneous BID  . isosorbide mononitrate  90 mg Oral Daily  . mouth rinse  15 mL Mouth Rinse BID  . mometasone-formoterol  2 puff Inhalation BID  . montelukast  10 mg Oral QHS  . pantoprazole  40 mg Oral QAC breakfast  . sodium chloride flush  3 mL Intravenous Q12H  . ticagrelor  90 mg Oral BID  . Vitamin D (Ergocalciferol)  50,000 Units Oral Q30 days   acetaminophen **OR** acetaminophen, albuterol, bisacodyl, hydrOXYzine, nitroGLYCERIN, ondansetron **OR** ondansetron (ZOFRAN) IV, oxyCODONE-acetaminophen, traZODone  Assessment/ Plan:  62 y.o.  female African-American femalewith chronic kidney disease stage III followed by Dr. Arty Baumgartner, [facility], hypertension, coronary artery disease, congestive heart failure with diastolic dysfunction, diabetes mellitus type II, hepatitis C chronic, peptic ulcer disease, obstructive sleep apnea  1.  Acute renal failure on chronic kidney disease stage III baseline creatinine 1.81/GFR 35 (Aug 05, 2017) 2.  Peripheral edema. 3.  Hypertension. 4.  Pulmonary edema  Plan:  Patient continues to  have significant diuresis.  Urine output was 5 L over the preceding 24 hours.  We will maintain the patient on Lasix drip through the weekend.  Consider transitioning the patient to torsemide as well as as needed metolazone upon discharge.  Creatinine is up a bit to 2.7 today.  We may need to decrease Lasix drip tomorrow if creatinine continues to rise.  Further plan as patient progresses.   LOS: 6 Emmakate Hypes 3/30/201912:36 PM

## 2017-10-09 NOTE — Progress Notes (Signed)
[place] at [place] NAME: [name]    MR#:  [id number]  DATE OF BIRTH:  [birth year]  SUBJECTIVE:  CHIEF COMPLAINT:   Chief Complaint  Patient presents with  . Shortness of Breath  . Facial Swelling  Complains of leg swelling, no events overnight per nursing staff S, home when cleared by cardiology  REVIEW OF SYSTEMS:  CONSTITUTIONAL: No fever, fatigue or weakness.  EYES: No blurred or double vision.  EARS, NOSE, AND THROAT: No tinnitus or ear pain.  RESPIRATORY: No cough, shortness of breath, wheezing or hemoptysis.  CARDIOVASCULAR: No chest pain, orthopnea, edema.  GASTROINTESTINAL: No nausea, vomiting, diarrhea or abdominal pain.  GENITOURINARY: No dysuria, hematuria.  ENDOCRINE: No polyuria, nocturia,  HEMATOLOGY: No anemia, easy bruising or bleeding SKIN: No rash or lesion. MUSCULOSKELETAL: No joint pain or arthritis.   NEUROLOGIC: No tingling, numbness, weakness.  PSYCHIATRY: No anxiety or depression.   ROS  DRUG ALLERGIES:   Allergies  Allergen Reactions  . Shellfish Allergy Anaphylaxis and Swelling  . Diazepam Other (See Comments)    "felt like out of body experience"  . Morphine And Related Itching    VITALS:  Blood pressure (!) 139/50, pulse 70, temperature 97.8 F (36.6 C), resp. rate 18, height 5\' 8"  (1.727 m), weight (!) 136.1 kg (300 lb 1.6 oz), SpO2 97 %.  PHYSICAL EXAMINATION:  GENERAL:  62 y.o.-year-old patient lying in the bed with no acute distress.  EYES: Pupils equal, round, reactive to light and accommodation. No scleral icterus. Extraocular muscles intact.  HEENT: Head atraumatic, normocephalic. Oropharynx and nasopharynx clear.  NECK:  Supple, no jugular venous distention. No thyroid enlargement, no tenderness.  LUNGS: Normal breath sounds bilaterally, no wheezing, rales,rhonchi or crepitation. No use of accessory muscles of respiration.  CARDIOVASCULAR: S1, S2 normal. No murmurs, rubs, or gallops.   ABDOMEN: Soft, nontender, nondistended. Bowel sounds present. No organomegaly or mass.  EXTREMITIES: No pedal edema, cyanosis, or clubbing.  NEUROLOGIC: Cranial nerves II through XII are intact. Muscle strength 5/5 in all extremities. Sensation intact. Gait not checked.  PSYCHIATRIC: The patient is alert and oriented x 3.  SKIN: No obvious rash, lesion, or ulcer.   Physical Exam LABORATORY PANEL:   CBC Recent Labs  Lab 10/09/17 0700  WBC 7.7  HGB 9.5*  HCT 29.7*  PLT 257   ------------------------------------------------------------------------------------------------------------------  Chemistries  Recent Labs  Lab 10/03/17 1628  10/07/17 0607  10/09/17 0700  NA  --    < > 140   < > 139  K  --    < > 3.8   < > 3.5  CL  --    < > 104   < > 103  CO2  --    < > 23   < > 23  GLUCOSE  --    < > 97   < > 205*  BUN  --    < > 100*   < > 100*  CREATININE  --    < > 2.52*   < > 2.79*  CALCIUM  --    < > 8.8*   < > 8.7*  MG  --    < > 1.7  --   --   AST 74*  --   --   --   --   ALT 74*  --   --   --   --   ALKPHOS 393*  --   --   --   --  BILITOT 0.8  --   --   --   --    < > = values in this interval not displayed.   ------------------------------------------------------------------------------------------------------------------  Cardiac Enzymes Recent Labs  Lab 10/03/17 1500  TROPONINI <0.03   ------------------------------------------------------------------------------------------------------------------  RADIOLOGY:  No results found.  ASSESSMENT AND PLAN:  62 year old female with past medical history significant for chronic diastolic CHF, CAD status post stent, hypertension, morbid obesity, stage IV CK D, sleep apnea presents to hospital secondary to dyspnea on exertion and weight gain  1. Acute on chronic diastolic CHF exacerbation Resolving Secondary to noncompliance Continue current regiment, on IV Lasix drips per cardiology, at discharge need torsemide  bid and as needed metolazone or weekly metolazone, continue to encourage ambulation, TED hose, low-sodium/heart healthy diet Right heart catheterization as outpatient   2. Acute on chronic kidney disease stage IV Nephrology input appreciated Most likely with cardiorenal syndrome   3. CAD-history of stents in 2018 Stable Cardiology input appreciated, continue aspirin, Brilinta, Coreg, Imdur and Lipitor  4. DM type II Stable on current regiment  5.  COPD without exacerbation Stable Breathing treatments as needed  Disposition home once cleared by cardiology  All the records are reviewed and case discussed with Care Management/Social Workerr. Management plans discussed with the patient, family and they are in agreement.  CODE STATUS: full  TOTAL TIME TAKING CARE OF THIS PATIENT: 35 minutes.     POSSIBLE D/C IN 1-3 DAYS, DEPENDING ON CLINICAL CONDITION.   Avel Peace Salary M.D on 10/09/2017   Between 7am to 6pm - Pager - [phone]  After 6pm go to [email or web address] - password [email or web address] [facility]  Office  [phone]  CC: Primary care physician; [name], PA-C  Note: This dictation was prepared with Dragon dictation along with smaller phrase technology. Any transcriptional errors that result from this process are unintentional.

## 2017-10-09 NOTE — Progress Notes (Signed)
Progress Note  Patient Name: [name] Date of Encounter: 10/09/2017  Primary Cardiologist: Sherren Mocha, MD   Subjective   Breathing is better  No CP    Inpatient Medications    Scheduled Meds: . allopurinol  100 mg Oral Daily  . aspirin EC  81 mg Oral Daily  . atorvastatin  80 mg Oral q1800  . carvedilol  25 mg Oral BID WC  . docusate sodium  100 mg Oral BID  . heparin injection (subcutaneous)  5,000 Units Subcutaneous Q8H  . hydrALAZINE  50 mg Oral Q8H  . insulin aspart  0-5 Units Subcutaneous QHS  . insulin aspart  0-9 Units Subcutaneous TID WC  . insulin aspart  8 Units Subcutaneous TID WC  . insulin glargine  50 Units Subcutaneous BID  . isosorbide mononitrate  90 mg Oral Daily  . mouth rinse  15 mL Mouth Rinse BID  . mometasone-formoterol  2 puff Inhalation BID  . montelukast  10 mg Oral QHS  . pantoprazole  40 mg Oral QAC breakfast  . sodium chloride flush  3 mL Intravenous Q12H  . ticagrelor  90 mg Oral BID  . Vitamin D (Ergocalciferol)  50,000 Units Oral Q30 days   Continuous Infusions: . furosemide (LASIX) infusion 10 mg/hr (10/08/17 1820)   PRN Meds: acetaminophen **OR** acetaminophen, albuterol, bisacodyl, hydrOXYzine, nitroGLYCERIN, ondansetron **OR** ondansetron (ZOFRAN) IV, oxyCODONE-acetaminophen, traZODone   Vital Signs    Vitals:   10/08/17 1707 10/08/17 1931 10/09/17 0547 10/09/17 0812  BP: (!) 143/62 (!) 131/55 (!) 152/62 (!) 143/50  Pulse: 64 65 75 80  Resp: 16 18 17 18   Temp:  98.1 F (36.7 C) 98.5 F (36.9 C) 97.8 F (36.6 C)  TempSrc:  Oral Oral   SpO2: 100% 100% 96% 97%  Weight:   (!) 300 lb 1.6 oz (136.1 kg)   Height:        Intake/Output Summary (Last 24 hours) at 10/09/2017 1029 Last data filed at 10/09/2017 0400 Gross per 24 hour  Intake 240 ml  Output 2700 ml  Net -2460 ml   Filed Weights   10/07/17 0329 10/08/17 0406 10/09/17 0547  Weight: (!) 313 lb 9.6 oz (142.2 kg) (!) 310 lb 14.4 oz (141 kg) (!) 300 lb 1.6 oz  (136.1 kg)    Telemetry    SR   - Personally Reviewed  ECG      Physical Exam  Morbidly obese 62 yo  GEN: No acute distress.   Neck: Neck is full   Cardiac: RRR, II/VI systolic murmur  No rubs, or gallops.  Respiratory: Bilateral rhonchi   GI: Soft, nontender, non-distended  MS: Tr  edema; No deformity. Neuro:  Nonfocal  Psych: Normal affect   Labs    Chemistry Recent Labs  Lab 10/03/17 1628  10/07/17 0827 10/08/17 0637 10/09/17 0700  NA  --    < > 140 140 139  K  --    < > 3.8 3.8 3.5  CL  --    < > 105 103 103  CO2  --    < > 23 24 23   GLUCOSE  --    < > 77 143* 205*  BUN  --    < > 99* 100* 100*  CREATININE  --    < > 2.46* 2.39* 2.79*  CALCIUM  --    < > 8.9 9.0 8.7*  PROT 7.3  --   --   --   --  ALBUMIN 3.5  --   --   --   --   AST 74*  --   --   --   --   ALT 74*  --   --   --   --   ALKPHOS 393*  --   --   --   --   BILITOT 0.8  --   --   --   --   GFRNONAA  --    < > 20* 21* 17*  GFRAA  --    < > 24* 24* 20*  ANIONGAP  --    < > 12 13 13    < > = values in this interval not displayed.     Hematology Recent Labs  Lab 10/03/17 1500 10/09/17 0700  WBC 6.7 7.7  RBC 3.08* 3.53*  HGB 8.5* 9.5*  HCT 26.4* 29.7*  MCV 85.7 84.1  MCH 27.5 27.0  MCHC 32.1 32.2  RDW 18.8* 18.6*  PLT 205 257    Cardiac Enzymes Recent Labs  Lab 10/03/17 1500  TROPONINI <0.03   No results for input(s): TROPIPOC in the last 168 hours.   BNP Recent Labs  Lab 10/03/17 1628  BNP 147.0*     DDimer No results for input(s): DDIMER in the last 168 hours.   Radiology    No results found.  Cardiac Studies   Echocardiogram: 09/15/2017 Study Conclusions  - Left ventricle: The cavity size was at the upper limits of normal. Wall thickness was increased in a pattern of mild LVH. Systolic function was vigorous. The estimated ejection fraction was in the range of 65% to 70%. Wall motion was normal; there were no regional wall motion abnormalities. Doppler  parameters are consistent with abnormal left ventricular relaxation (grade 1 diastolic dysfunction). Doppler parameters are consistent with high ventricular filling pressure. - Aortic valve: Transvalvular velocity was increased. There was moderate stenosis. There was trivial regurgitation. Peak velocity (S): 308 cm/s. Mean gradient (S): 20 mm Hg. Valve area (VTI): 1.34 cm^2. Valve area (Vmax): 1.11 cm^2. - Ascending aorta: The ascending aorta was mildly dilated. - Mitral valve: Calcified annulus. Mildly thickened leaflets . - Left atrium: The atrium was mildly dilated. - Right ventricle: The cavity size was normal. Systolic function was normal. - Inferior vena cava: The vessel was dilated. The respirophasic diameter changes were blunted (<50%), consistent with elevated central venous pressure. - Pericardium, extracardiac: A trivial pericardial effusion was identified posterior to the heart.    Patient Profile     62 y.o. female history of chronic diastolic CHF, CAD (s/p DES to Prox-LAD in 09/2016), moderateAS, HTN, HLD, morbid obesity, Stage 4 CKD, and OSAwho is being seen today for the evaluation of acute CHF.    Assessment & Plan    Acute on chronic diastolic CHF    Pt has diuresed significantly since admit  Wt down to 300 from ? 319   Breathing better    Still has some increased volume on exam    On Lasix gtt    2  CKD   Stage IV    Cr has bumped some   REnal to see pt     3  CAD   On ASA, Brilinta, Coreg, Imdur and lipitor  No symptoms to sugg angina    4  AS  Moderate by echo    5  Obbesity   6  sleep apnea     Pt says she has it  Cannot afford CPAP  This  is contrbuting to CHF    Need to contact SW to see if this can be addressed   For questions or updates, please contact [facility] Please consult [email or web address] for contact info under Cardiology/STEMI.      Signed, Dorris Carnes, MD  10/09/2017, 10:29 AM

## 2017-10-10 DIAGNOSIS — I2510 Atherosclerotic heart disease of native coronary artery without angina pectoris: Secondary | ICD-10-CM

## 2017-10-10 DIAGNOSIS — N189 Chronic kidney disease, unspecified: Secondary | ICD-10-CM

## 2017-10-10 LAB — BASIC METABOLIC PANEL
Anion gap: 15 (ref 5–15)
BUN: 99 mg/dL — ABNORMAL HIGH (ref 6–20)
CO2: 22 mmol/L (ref 22–32)
Calcium: 8.9 mg/dL (ref 8.9–10.3)
Chloride: 100 mmol/L — ABNORMAL LOW (ref 101–111)
Creatinine, Ser: 2.58 mg/dL — ABNORMAL HIGH (ref 0.44–1.00)
GFR calc Af Amer: 22 mL/min — ABNORMAL LOW (ref >60)
GFR calc non Af Amer: 19 mL/min — ABNORMAL LOW (ref >60)
Glucose, Bld: 273 mg/dL — ABNORMAL HIGH (ref 65–99)
Potassium: 3.7 mmol/L (ref 3.5–5.1)
Sodium: 137 mmol/L (ref 135–145)

## 2017-10-10 LAB — GLUCOSE, CAPILLARY
Glucose-Capillary: 297 mg/dL — ABNORMAL HIGH (ref 65–99)
Glucose-Capillary: 281 mg/dL — ABNORMAL HIGH (ref 65–99)
Glucose-Capillary: 87 mg/dL (ref 65–99)
Glucose-Capillary: 152 mg/dL — ABNORMAL HIGH (ref 65–99)

## 2017-10-10 NOTE — Progress Notes (Signed)
[facility]  ROUNDING NOTE   Subjective:  Patient seen at bedside. Continues to diurese well with Lasix drip. Urine output was 3.4 L over the preceding 24 hours. Weight currently 137.6 kg. Of note the patient had a half empty Gatorade bottle at her bedside.   Objective:  Vital signs in last 24 hours:  Temp:  [98.3 F (36.8 C)-98.5 F (36.9 C)] 98.3 F (36.8 C) (03/31 0805) Pulse Rate:  [65-74] 66 (03/31 1227) Resp:  [17-18] 18 (03/31 0805) BP: (123-144)/(47-70) 144/70 (03/31 1228) SpO2:  [97 %-98 %] 98 % (03/31 0805) Weight:  [137.6 kg (303 lb 4.8 oz)] 137.6 kg (303 lb 4.8 oz) (03/31 0442)  Weight change: 1.452 kg (3 lb 3.2 oz) Filed Weights   10/08/17 0406 10/09/17 0547 10/10/17 0442  Weight: (!) 141 kg (310 lb 14.4 oz) (!) 136.1 kg (300 lb 1.6 oz) (!) 137.6 kg (303 lb 4.8 oz)    Intake/Output: I/O last 3 completed shifts: In: 960.7 [P.O.:240; I.V.:720.7] Out: 4700 [Urine:4700]   Intake/Output this shift:  Total I/O In: 120 [P.O.:120] Out: -   Physical Exam: General: No acute distress  Head: Normocephalic, atraumatic. Moist oral mucosal membranes  Eyes: Anicteric  Neck: Supple, trachea midline  Lungs:  CTAB, normal effort  Heart: S1S2 no rubs 2/6 SEM  Abdomen:  Soft, nontender, bowel sounds present  Extremities: 1+ peripheral edema.  Neurologic: Awake, alert, following commands  Skin: No lesions  Access:     Basic Metabolic Panel: Recent Labs  Lab 10/05/17 0436  10/07/17 0607 10/07/17 0827 10/08/17 0637 10/09/17 0700 10/10/17 0857  NA 137   < > 140 140 140 139 137  K 4.3   < > 3.8 3.8 3.8 3.5 3.7  CL 103   < > 104 105 103 103 100*  CO2 22   < > 23 23 24 23 22   GLUCOSE 264*   < > 97 77 143* 205* 273*  BUN 100*   < > 100* 99* 100* 100* 99*  CREATININE 2.89*   < > 2.52* 2.46* 2.39* 2.79* 2.58*  CALCIUM 8.4*   < > 8.8* 8.9 9.0 8.7* 8.9  MG 2.0  --  1.7  --   --   --   --    < > = values in this interval not displayed.    Liver  Function Tests: Recent Labs  Lab 10/03/17 1628  AST 74*  ALT 74*  ALKPHOS 393*  BILITOT 0.8  PROT 7.3  ALBUMIN 3.5   No results for input(s): LIPASE, AMYLASE in the last 168 hours. No results for input(s): AMMONIA in the last 168 hours.  CBC: Recent Labs  Lab 10/03/17 1500 10/09/17 0700  WBC 6.7 7.7  HGB 8.5* 9.5*  HCT 26.4* 29.7*  MCV 85.7 84.1  PLT 205 257    Cardiac Enzymes: Recent Labs  Lab 10/03/17 1500  TROPONINI <0.03    BNP: Invalid input(s): POCBNP  CBG: Recent Labs  Lab 10/09/17 1133 10/09/17 1653 10/09/17 2114 10/10/17 0806 10/10/17 1151  GLUCAP 136* 181* 149* 297* 281*    Microbiology: Results for orders placed or performed during the hospital encounter of 09/14/17  MRSA PCR Screening     Status: None   Collection Time: 09/14/17  4:40 PM  Result Value Ref Range Status   MRSA by PCR NEGATIVE NEGATIVE Final    Comment:        The GeneXpert MRSA Assay (FDA approved for NASAL specimens only), is one component  of a comprehensive MRSA colonization surveillance program. It is not intended to diagnose MRSA infection nor to guide or monitor treatment for MRSA infections. Performed at [facility], [address] [place], [place] [address]   CULTURE, BLOOD (ROUTINE X 2) w Reflex to ID Panel     Status: None   Collection Time: 09/14/17 11:11 PM  Result Value Ref Range Status   Specimen Description BLOOD LT [facility]  Final   Special Requests   Final    BOTTLES DRAWN AEROBIC AND ANAEROBIC Blood Culture adequate volume   Culture   Final    NO GROWTH 5 DAYS Performed at [facility], [address] [place], [place] [address]    Report Status 09/19/2017 FINAL  Final  CULTURE, BLOOD (ROUTINE X 2) w Reflex to ID Panel     Status: None   Collection Time: 09/14/17 11:37 PM  Result Value Ref Range Status   Specimen Description BLOOD RT ARM  Final   Special Requests   Final    BOTTLES DRAWN AEROBIC AND ANAEROBIC Blood Culture  results may not be optimal due to an inadequate volume of blood received in culture bottles   Culture   Final    NO GROWTH 5 DAYS Performed at [facility], [address], [place], [place] [address]    Report Status 09/19/2017 FINAL  Final  Urine Culture     Status: None   Collection Time: 09/15/17  3:18 AM  Result Value Ref Range Status   Specimen Description   Final    URINE, RANDOM Performed at [facility], [address], [place], [place] [address]    Special Requests   Final    NONE Performed at [facility], [address] [place],  [address]    Culture   Final    NO GROWTH Performed at [facility], [place] [address], [place],  [address]    Report Status 09/16/2017 FINAL  Final    Coagulation Studies: No results for input(s): LABPROT, INR in the last 72 hours.  Urinalysis: No results for input(s): COLORURINE, LABSPEC, PHURINE, GLUCOSEU, HGBUR, BILIRUBINUR, KETONESUR, PROTEINUR, UROBILINOGEN, NITRITE, LEUKOCYTESUR in the last 72 hours.  Invalid input(s): APPERANCEUR    Imaging: No results found.   Medications:   . furosemide (LASIX) infusion 10 mg/hr (10/10/17 1042)   . allopurinol  100 mg Oral Daily  . aspirin EC  81 mg Oral Daily  . atorvastatin  80 mg Oral q1800  . carvedilol  25 mg Oral BID WC  . docusate sodium  100 mg Oral BID  . heparin injection (subcutaneous)  5,000 Units Subcutaneous Q8H  . hydrALAZINE  50 mg Oral Q8H  . insulin aspart  0-5 Units Subcutaneous QHS  . insulin aspart  0-9 Units Subcutaneous TID WC  . insulin aspart  8 Units Subcutaneous TID WC  . insulin glargine  50 Units Subcutaneous BID  . isosorbide mononitrate  90 mg Oral Daily  . mouth rinse  15 mL Mouth Rinse BID  . mometasone-formoterol  2 puff Inhalation BID  . montelukast  10 mg Oral QHS  . pantoprazole  40 mg Oral QAC breakfast  . sodium chloride flush  3 mL Intravenous Q12H  . ticagrelor  90 mg Oral BID  . Vitamin D  (Ergocalciferol)  50,000 Units Oral Q30 days   acetaminophen **OR** acetaminophen, albuterol, bisacodyl, hydrOXYzine, nitroGLYCERIN, ondansetron **OR** ondansetron (ZOFRAN) IV, oxyCODONE-acetaminophen, traZODone  Assessment/ Plan:  62 y.o. female  African-American femalewith chronic kidney disease stage III followed by Dr. Arty Baumgartner, [facility], hypertension, coronary artery disease, congestive heart failure with diastolic dysfunction, diabetes mellitus type II, hepatitis C chronic, peptic ulcer disease, obstructive sleep apnea  1.  Acute renal failure on chronic kidney disease stage III baseline creatinine 1.81/GFR 35 (Aug 05, 2017) 2.  Peripheral edema. 3.  Hypertension. 4.  Pulmonary edema  Plan:  UF achieved yesterday was 3.4 kg.  However the patient's weight is not changing as fast as we would hope given the amount of diuresis she is experiencing.  For now patient will be maintained on Lasix drip.  Consider transition to p.o. torsemide along with as needed metolazone tomorrow.  Of note there was a half empty bottle of Gatorade at her bedside today.  Continue to monitor renal function and urine output daily for now.   LOS: 7 Kiasha Bellin 3/31/20191:54 PM

## 2017-10-10 NOTE — Progress Notes (Signed)
.   Progress Note  Patient Name: [name] Date of Encounter: 10/10/2017  Primary Cardiologist: Sherren Mocha, MD   Subjective   Pt sleeping   Relatively supine  Denies SOB  No CP    Inpatient Medications    Scheduled Meds: . allopurinol  100 mg Oral Daily  . aspirin EC  81 mg Oral Daily  . atorvastatin  80 mg Oral q1800  . carvedilol  25 mg Oral BID WC  . docusate sodium  100 mg Oral BID  . heparin injection (subcutaneous)  5,000 Units Subcutaneous Q8H  . hydrALAZINE  50 mg Oral Q8H  . insulin aspart  0-5 Units Subcutaneous QHS  . insulin aspart  0-9 Units Subcutaneous TID WC  . insulin aspart  8 Units Subcutaneous TID WC  . insulin glargine  50 Units Subcutaneous BID  . isosorbide mononitrate  90 mg Oral Daily  . mouth rinse  15 mL Mouth Rinse BID  . mometasone-formoterol  2 puff Inhalation BID  . montelukast  10 mg Oral QHS  . pantoprazole  40 mg Oral QAC breakfast  . sodium chloride flush  3 mL Intravenous Q12H  . ticagrelor  90 mg Oral BID  . Vitamin D (Ergocalciferol)  50,000 Units Oral Q30 days   Continuous Infusions: . furosemide (LASIX) infusion Stopped (10/10/17 0826)   PRN Meds: Vital Signs    Vitals:   10/09/17 1654 10/09/17 2008 10/10/17 0442 10/10/17 0805  BP: (!) 123/47 (!) 125/52 123/64 (!) 125/55  Pulse: 71 72 74 65  Resp: 18 17 18 18   Temp:  98.5 F (36.9 C) 98.5 F (36.9 C) 98.3 F (36.8 C)  TempSrc:  Oral Oral Oral  SpO2: 97% 98% 98% 98%  Weight:   (!) 303 lb 4.8 oz (137.6 kg)   Height:        Intake/Output Summary (Last 24 hours) at 10/10/2017 0934 Last data filed at 10/10/2017 0600 Gross per 24 hour  Intake 960.67 ml  Output 2600 ml  Net -1639.33 ml   Filed Weights   10/08/17 0406 10/09/17 0547 10/10/17 0442  Weight: (!) 310 lb 14.4 oz (141 kg) (!) 300 lb 1.6 oz (136.1 kg) (!) 303 lb 4.8 oz (137.6 kg)    Telemetry    SR + 1 11 beat NSVT  Cannot exclude artifact  - Personally Reviewed  ECG      Physical Exam    Morbidly obese 62 yo  GEN: No acute distress.   Neck: Neck is full   Cardiac: RRR, II/VI systolic murmur  No rubs, or gallops.  Respiratory: Bilateral rhonchi   GI: Soft, nontender, non-distended  MS: Tr to 1+edema; Tender to palpation Neuro:  Nonfocal  Psych: Normal affect   Labs    Chemistry Recent Labs  Lab 10/03/17 1628  10/08/17 0637 10/09/17 0700 10/10/17 0857  NA  --    < > 140 139 137  K  --    < > 3.8 3.5 3.7  CL  --    < > 103 103 100*  CO2  --    < > 24 23 22   GLUCOSE  --    < > 143* 205* 273*  BUN  --    < > 100* 100* 99*  CREATININE  --    < > 2.39* 2.79* 2.58*  CALCIUM  --    < > 9.0 8.7* 8.9  PROT 7.3  --   --   --   --  ALBUMIN 3.5  --   --   --   --   AST 74*  --   --   --   --   ALT 74*  --   --   --   --   ALKPHOS 393*  --   --   --   --   BILITOT 0.8  --   --   --   --   GFRNONAA  --    < > 21* 17* 19*  GFRAA  --    < > 24* 20* 22*  ANIONGAP  --    < > 13 13 15    < > = values in this interval not displayed.     Hematology Recent Labs  Lab 10/03/17 1500 10/09/17 0700  WBC 6.7 7.7  RBC 3.08* 3.53*  HGB 8.5* 9.5*  HCT 26.4* 29.7*  MCV 85.7 84.1  MCH 27.5 27.0  MCHC 32.1 32.2  RDW 18.8* 18.6*  PLT 205 257    Cardiac Enzymes Recent Labs  Lab 10/03/17 1500  TROPONINI <0.03   No results for input(s): TROPIPOC in the last 168 hours.   BNP Recent Labs  Lab 10/03/17 1628  BNP 147.0*     DDimer No results for input(s): DDIMER in the last 168 hours.   Radiology    No results found.  Cardiac Studies   Echocardiogram: 09/15/2017 Study Conclusions  - Left ventricle: The cavity size was at the upper limits of normal. Wall thickness was increased in a pattern of mild LVH. Systolic function was vigorous. The estimated ejection fraction was in the range of 65% to 70%. Wall motion was normal; there were no regional wall motion abnormalities. Doppler parameters are consistent with abnormal left ventricular relaxation  (grade 1 diastolic dysfunction). Doppler parameters are consistent with high ventricular filling pressure. - Aortic valve: Transvalvular velocity was increased. There was moderate stenosis. There was trivial regurgitation. Peak velocity (S): 308 cm/s. Mean gradient (S): 20 mm Hg. Valve area (VTI): 1.34 cm^2. Valve area (Vmax): 1.11 cm^2. - Ascending aorta: The ascending aorta was mildly dilated. - Mitral valve: Calcified annulus. Mildly thickened leaflets . - Left atrium: The atrium was mildly dilated. - Right ventricle: The cavity size was normal. Systolic function was normal. - Inferior vena cava: The vessel was dilated. The respirophasic diameter changes were blunted (<50%), consistent with elevated central venous pressure. - Pericardium, extracardiac: A trivial pericardial effusion was identified posterior to the heart.    Patient Profile     62 y.o. female history of chronic diastolic CHF, CAD (s/p DES to Prox-LAD in 09/2016), moderateAS, HTN, HLD, morbid obesity, Stage 4 CKD, and OSAwho is being seen today for the evaluation of acute CHF.    Assessment & Plan    Acute on chronic diastolic CHF    Pt has diuresed significantly since admit  Wt down to 313 from ? 319   Appears comfortable but still with some volume load on exam   ? compliaance with fluid restriction esp at home    2  CKD   Stage IV Cr 2.58  Renal service following    3  CAD   On ASA, Brilinta, Coreg, Imdur and lipitor  No symptoms to sugg angina    4  AS  Moderate by echo    5  Morbid obesity   6  sleep apnea     Pt says she has it  Cannot afford CPAP  This is contrbuting to CHF  Need to contact SW to see if this can be addressed   For questions or updates, please contact [facility] Please consult [email or web address] for contact info under Cardiology/STEMI.      Signed, Dorris Carnes, MD  10/10/2017, 9:34 AM

## 2017-10-10 NOTE — Progress Notes (Signed)
[place] NAME: [name]    MR#:  [id number]  DATE OF BIRTH:  1956/02/27  SUBJECTIVE:  CHIEF COMPLAINT:   Chief Complaint  Patient presents with  . Shortness of Breath  . Facial Swelling  Patient without complaint, per nursing staff-has lost IV access, attempting to replace IV access to continue Lasix drip, nephrology input greatly appreciated  REVIEW OF SYSTEMS:  CONSTITUTIONAL: No fever, fatigue or weakness.  EYES: No blurred or double vision.  EARS, NOSE, AND THROAT: No tinnitus or ear pain.  RESPIRATORY: No cough, shortness of breath, wheezing or hemoptysis.  CARDIOVASCULAR: No chest pain, orthopnea, edema.  GASTROINTESTINAL: No nausea, vomiting, diarrhea or abdominal pain.  GENITOURINARY: No dysuria, hematuria.  ENDOCRINE: No polyuria, nocturia,  HEMATOLOGY: No anemia, easy bruising or bleeding SKIN: No rash or lesion. MUSCULOSKELETAL: No joint pain or arthritis.   NEUROLOGIC: No tingling, numbness, weakness.  PSYCHIATRY: No anxiety or depression.   ROS  DRUG ALLERGIES:   Allergies  Allergen Reactions  . Shellfish Allergy Anaphylaxis and Swelling  . Diazepam Other (See Comments)    "felt like out of body experience"  . Morphine And Related Itching    VITALS:  Blood pressure (!) 144/70, pulse 66, temperature 98.3 F (36.8 C), temperature source Oral, resp. rate 18, height 5\' 8"  (1.727 m), weight (!) 137.6 kg (303 lb 4.8 oz), SpO2 98 %.  PHYSICAL EXAMINATION:  GENERAL:  62 y.o.-year-old patient lying in the bed with no acute distress.  EYES: Pupils equal, round, reactive to light and accommodation. No scleral icterus. Extraocular muscles intact.  HEENT: Head atraumatic, normocephalic. Oropharynx and nasopharynx clear.  NECK:  Supple, no jugular venous distention. No thyroid enlargement, no tenderness.  LUNGS: Normal breath sounds bilaterally, no wheezing, rales,rhonchi or crepitation. No use of accessory  muscles of respiration.  CARDIOVASCULAR: S1, S2 normal. No murmurs, rubs, or gallops.  ABDOMEN: Soft, nontender, nondistended. Bowel sounds present. No organomegaly or mass.  EXTREMITIES: No pedal edema, cyanosis, or clubbing.  NEUROLOGIC: Cranial nerves II through XII are intact. Muscle strength 5/5 in all extremities. Sensation intact. Gait not checked.  PSYCHIATRIC: The patient is alert and oriented x 3.  SKIN: No obvious rash, lesion, or ulcer.   Physical Exam LABORATORY PANEL:   CBC Recent Labs  Lab 10/09/17 0700  WBC 7.7  HGB 9.5*  HCT 29.7*  PLT 257   ------------------------------------------------------------------------------------------------------------------  Chemistries  Recent Labs  Lab 10/03/17 1628  10/07/17 0607  10/10/17 0857  NA  --    < > 140   < > 137  K  --    < > 3.8   < > 3.7  CL  --    < > 104   < > 100*  CO2  --    < > 23   < > 22  GLUCOSE  --    < > 97   < > 273*  BUN  --    < > 100*   < > 99*  CREATININE  --    < > 2.52*   < > 2.58*  CALCIUM  --    < > 8.8*   < > 8.9  MG  --    < > 1.7  --   --   AST 74*  --   --   --   --   ALT 74*  --   --   --   --   Arabella Merles  393*  --   --   --   --   BILITOT 0.8  --   --   --   --    < > = values in this interval not displayed.   ------------------------------------------------------------------------------------------------------------------  Cardiac Enzymes Recent Labs  Lab 10/03/17 1500  TROPONINI <0.03   ------------------------------------------------------------------------------------------------------------------  RADIOLOGY:  No results found.  ASSESSMENT AND PLAN:  62 year old female with past medical history significant for chronic diastolic CHF, CAD status post stent, hypertension, morbid obesity, stage IV CK D, sleep apnea presents to hospital secondary to dyspnea on exertion and weight gain  1. Acute on chronic diastolic CHF exacerbation Resolving Secondary to  noncompliance Continue current regiment, on IV Lasix drips per cardiology, at discharge need torsemidebidand as needed metolazone or weekly metolazone, continue to encourage ambulation, TED hose, low-sodium/heart healthy diet Right heart catheterization as outpatient  2. Acute on chronic kidney disease stage IV Nephrology input appreciated Most likely with cardiorenal syndrome  3. CAD-history of stents in 2018 Stable Cardiology input appreciated, continue aspirin, Brilinta, Coreg, Imdur and Lipitor  4. DM type II Stable on current regiment  5. COPD without exacerbation Stable Breathing treatments as needed  Disposition home once cleared by cardiology  All the records are reviewed and case discussed with Care Management/Social Workerr. Management plans discussed with the patient, family and they are in agreement.  CODE STATUS: full  TOTAL TIME TAKING CARE OF THIS PATIENT: 35 minutes.     POSSIBLE D/C IN 1-5 DAYS, DEPENDING ON CLINICAL CONDITION.   Avel Peace Salary M.D on 10/10/2017   Between 7am to 6pm - Pager - [phone]  After 6pm go to [email or web address] - password [email or web address] [facility]  Office  [phone]  CC: Primary care physician; [name], PA-C  Note: This dictation was prepared with Dragon dictation along with smaller phrase technology. Any transcriptional errors that result from this process are unintentional.

## 2017-10-11 ENCOUNTER — Ambulatory Visit: Payer: Medicaid Other | Admitting: Family

## 2017-10-11 LAB — BASIC METABOLIC PANEL
Anion gap: 13 (ref 5–15)
BUN: 97 mg/dL — ABNORMAL HIGH (ref 6–20)
CO2: 25 mmol/L (ref 22–32)
Calcium: 8.8 mg/dL — ABNORMAL LOW (ref 8.9–10.3)
Chloride: 101 mmol/L (ref 101–111)
Creatinine, Ser: 2.7 mg/dL — ABNORMAL HIGH (ref 0.44–1.00)
GFR calc Af Amer: 21 mL/min — ABNORMAL LOW (ref >60)
GFR calc non Af Amer: 18 mL/min — ABNORMAL LOW (ref >60)
Glucose, Bld: 166 mg/dL — ABNORMAL HIGH (ref 65–99)
Potassium: 3.6 mmol/L (ref 3.5–5.1)
Sodium: 139 mmol/L (ref 135–145)

## 2017-10-11 LAB — GLUCOSE, CAPILLARY: Glucose-Capillary: 172 mg/dL — ABNORMAL HIGH (ref 65–99)

## 2017-10-11 MED ORDER — TORSEMIDE 20 MG PO TABS: 40.0000 mg | ORAL_TABLET | Freq: Two times a day (BID) | ORAL | 0 refills | Status: DC

## 2017-10-11 MED ORDER — TORSEMIDE 20 MG PO TABS
40.0000 mg | ORAL_TABLET | Freq: Two times a day (BID) | ORAL | Status: DC
  Administered 2017-10-11: 40 mg via ORAL
  Filled 2017-10-11: qty 2

## 2017-10-11 MED ORDER — METOLAZONE 2.5 MG PO TABS: 2.5000 mg | ORAL_TABLET | Freq: Every day | ORAL | 0 refills | Status: DC | PRN

## 2017-10-11 MED ORDER — METOLAZONE 2.5 MG PO TABS
2.5000 mg | ORAL_TABLET | Freq: Every day | ORAL | Status: DC | PRN
  Filled 2017-10-11: qty 1

## 2017-10-11 MED ORDER — POTASSIUM CHLORIDE CRYS ER 20 MEQ PO TBCR: 20.0000 meq | EXTENDED_RELEASE_TABLET | Freq: Two times a day (BID) | ORAL | 0 refills | Status: DC

## 2017-10-11 MED ORDER — HYDRALAZINE HCL 50 MG PO TABS: 50.0000 mg | ORAL_TABLET | Freq: Three times a day (TID) | ORAL | 0 refills | Status: DC

## 2017-10-11 MED ORDER — POTASSIUM CHLORIDE CRYS ER 20 MEQ PO TBCR
20.0000 meq | EXTENDED_RELEASE_TABLET | Freq: Two times a day (BID) | ORAL | Status: DC
  Administered 2017-10-11: 20 meq via ORAL
  Filled 2017-10-11: qty 1

## 2017-10-11 NOTE — Progress Notes (Signed)
Progress Note  Patient Name: [name] Date of Encounter: 10/11/2017  Primary Cardiologist: Burt Knack  Subjective   SOB improved. Ambulated in the hallway over the weekend without SOB/fatgiue. Feels "great" this morning. Weight down to 291 pounds (down from ~ 330 pounds, though there appears to be some inaccuracy of initial weights this admission). Renal function BUN/SCr 99/2.58-->97/2.70. BP remains elevated in the 097D mmHg systolic.   Inpatient Medications    Scheduled Meds: . allopurinol  100 mg Oral Daily  . aspirin EC  81 mg Oral Daily  . atorvastatin  80 mg Oral q1800  . carvedilol  25 mg Oral BID WC  . docusate sodium  100 mg Oral BID  . heparin injection (subcutaneous)  5,000 Units Subcutaneous Q8H  . hydrALAZINE  50 mg Oral Q8H  . insulin aspart  0-5 Units Subcutaneous QHS  . insulin aspart  0-9 Units Subcutaneous TID WC  . insulin aspart  8 Units Subcutaneous TID WC  . insulin glargine  50 Units Subcutaneous BID  . isosorbide mononitrate  90 mg Oral Daily  . mouth rinse  15 mL Mouth Rinse BID  . mometasone-formoterol  2 puff Inhalation BID  . montelukast  10 mg Oral QHS  . pantoprazole  40 mg Oral QAC breakfast  . sodium chloride flush  3 mL Intravenous Q12H  . ticagrelor  90 mg Oral BID  . Vitamin D (Ergocalciferol)  50,000 Units Oral Q30 days   Continuous Infusions:  PRN Meds: acetaminophen **OR** acetaminophen, albuterol, bisacodyl, hydrOXYzine, nitroGLYCERIN, ondansetron **OR** ondansetron (ZOFRAN) IV, oxyCODONE-acetaminophen, traZODone   Vital Signs    Vitals:   10/10/17 1555 10/10/17 2052 10/11/17 0551 10/11/17 0759  BP: (!) 123/54 140/60 (!) 147/53 (!) 159/68  Pulse: 69 72 71 77  Resp: 18     Temp: 98 F (36.7 C) 98.4 F (36.9 C) 98.3 F (36.8 C) 98.2 F (36.8 C)  TempSrc: Oral Oral Oral Oral  SpO2: 98% 96% 95% 99%  Weight:   291 lb 11.2 oz (132.3 kg)   Height:        Intake/Output Summary (Last 24 hours) at 10/11/2017 0918 Last data  filed at 10/11/2017 0601 Gross per 24 hour  Intake 360 ml  Output 3200 ml  Net -2840 ml   Filed Weights   10/09/17 0547 10/10/17 0442 10/11/17 0551  Weight: (!) 300 lb 1.6 oz (136.1 kg) (!) 303 lb 4.8 oz (137.6 kg) 291 lb 11.2 oz (132.3 kg)    Telemetry    NSR - Personally Reviewed  ECG    n/a - Personally Reviewed  Physical Exam   GEN: No acute distress.   Neck: No JVD. Cardiac: RRR, no murmurs, rubs, or gallops.  Respiratory: Clear to auscultation bilaterally.  GI: Soft, nontender, non-distended.   MS: No edema; No deformity. Neuro:  Alert and oriented x 3; Nonfocal.  Psych: Normal affect.  Labs    Chemistry Recent Labs  Lab 10/09/17 0700 10/10/17 0857 10/11/17 0424  NA 139 137 139  K 3.5 3.7 3.6  CL 103 100* 101  CO2 23 22 25   GLUCOSE 205* 273* 166*  BUN 100* 99* 97*  CREATININE 2.79* 2.58* 2.70*  CALCIUM 8.7* 8.9 8.8*  GFRNONAA 17* 19* 18*  GFRAA 20* 22* 21*  ANIONGAP 13 15 13      Hematology Recent Labs  Lab 10/09/17 0700  WBC 7.7  RBC 3.53*  HGB 9.5*  HCT 29.7*  MCV 84.1  MCH 27.0  MCHC 32.2  RDW 18.6*  PLT 257    Cardiac EnzymesNo results for input(s): TROPONINI in the last 168 hours. No results for input(s): TROPIPOC in the last 168 hours.   BNPNo results for input(s): BNP, PROBNP in the last 168 hours.   DDimer No results for input(s): DDIMER in the last 168 hours.   Radiology    No results found.  Cardiac Studies   Echocardiogram: 09/15/2017 Study Conclusions  - Left ventricle: The cavity size was at the upper limits of normal. Wall thickness was increased in a pattern of mild LVH. Systolic function was vigorous. The estimated ejection fraction was in the range of 65% to 70%. Wall motion was normal; there were no regional wall motion abnormalities. Doppler parameters are consistent with abnormal left ventricular relaxation (grade 1 diastolic dysfunction). Doppler parameters are consistent with high ventricular  filling pressure. - Aortic valve: Transvalvular velocity was increased. There was moderate stenosis. There was trivial regurgitation. Peak velocity (S): 308 cm/s. Mean gradient (S): 20 mm Hg. Valve area (VTI): 1.34 cm^2. Valve area (Vmax): 1.11 cm^2. - Ascending aorta: The ascending aorta was mildly dilated. - Mitral valve: Calcified annulus. Mildly thickened leaflets . - Left atrium: The atrium was mildly dilated. - Right ventricle: The cavity size was normal. Systolic function was normal. - Inferior vena cava: The vessel was dilated. The respirophasic diameter changes were blunted (<50%), consistent with elevated central venous pressure. - Pericardium, extracardiac: A trivial pericardial effusion was identified posterior to the heart.   Patient Profile     62 y.o. female with history of chronic diastolic CHF, CAD (s/p DES to Prox-LAD in 09/2016), moderateAS, HTN, HLD, morbid obesity, Stage 4 CKD, and OSAwho is being seen today for the evaluation of acute CHF.  Assessment & Plan    1. Acute on chronic diastolic CHF: -She does not appear grossly volume up at this time -Weight down to 291 pounds from ~ 330 pounds at admission  -Off Lasix gtt -Would place on torsemide 40 mg bid with KCl repletion -Close follow up in the office, may need prn metolazone, can evaluate this in the office -Echo as above -May benefit from [facility] -Nephrology on board  2. Acute on CKD stage IV: -Slight bump in SCr this morning -Nephrology on board -Torsemide as above  3. CAD: -No chest pain -ASA, Briltina, Coreg, Imdur, Lipitor -No plans for inpatient ischemic evaluation at this time  4.Aortic stenosis: -Echo as above -Follow up as an outpatient  5. Obesity/OSA/possible OHS: -Consulted pulmonology  -Reports inability to afford CPAP, recommend social work consult   6. Anemia ofchronic disease: -Per IM -Stable as of 3/30  7. HTN: -Increase hydralazine  to 75 mg tid -Continue to attempt to avoid amlodipine if able -Continue remaining medications    For questions or updates, please contact [place] Please consult [email or web address] for contact info under Cardiology/STEMI.    Signed, Christell Faith, PA-C [place] Pager: [phone] 10/11/2017, 9:18 AM

## 2017-10-11 NOTE — Care Management (Signed)
Anticipate discharge home today.  Notified [facility]. [facility] has the signed HF protocol. Requested RN PT SW orders

## 2017-10-11 NOTE — Plan of Care (Signed)
  Problem: Activity: Goal: Capacity to carry out activities will improve Outcome: Adequate for Discharge   Problem: Cardiac: Goal: Ability to achieve and maintain adequate cardiopulmonary perfusion will improve Outcome: Adequate for Discharge   Problem: Education: Goal: Ability to verbalize understanding of medication therapies will improve Outcome: Adequate for Discharge   Problem: Activity: Goal: Risk for activity intolerance will decrease Outcome: Adequate for Discharge

## 2017-10-11 NOTE — Progress Notes (Signed)
Rounded on patient.  62 year old with past medical history of chronic diastolic CHF, CAD s/p post stent, HTN, morbid obesity, DM type 2, Hep C, CKD - Stage IV, OSA.  Patient presented to the ED with anasarca, SOB, hypoxia, and dyspnea. Pt's admitting dx of acute on chronic diastolic CHF.    Active problem list this admission: 1. Acute on chronic diastolic CHF exacerbation 2. Acute on CKD Stage IV 3. CAD - history of stents in 2018 - aspirin, Brilinta, Coreg, Imdur and Lipitor 4. Aortic Stenosis 5. Obesity/OSA 6. Anemia of Chronic Disease 7. HTN 8. DM Type 2 9. COPD without exacerbation  Patient has been seen in the [facility] in the past.  Patient reported her scales are broken.  She states, "They are are so old and broken and she is not sure what is wrong with them."  Spoke with CM RN regarding patient needing scales.  CM RN to provide scales to patient.  Spoke with patient about [facility] appointment.  Patient requested this RN to  reschedule from Friday, October 15, 2017 at 12 noon to Thursday, October 14, 2017 at 12:40 p.m. So her daughter would not have to miss another day of work.  Patient reported she also has an eye appointment on October 14, 2017 as well.  Patient currently has an appointment to f/u with her PCP, HF Clinic this week as above, Dr. Juleen China for lab work on 10/14/2017 at 10 a.m. And will see Dr. Juleen China on 10/18/2017 on Monday at 1:40 p.m. the following week.  Patient's cardiologist is Dr. Burt Knack in [place], [place].  Patient has an appointment scheduled with Dr. Antionette Char NP on October 26, 2017 at 3:45 p.m.  Stressed the importance of keeping all appointments.    Education:   Reviewed the 5 Steps of Living Better with Heart Failure: 1. Weigh oneself every morning in the same amount of clothing or no clothing - be consistent.   2. Compare weight to the previous day's weight and assess symptoms.  Contact HF Clinic / Cardiologist of weight gain of 2-3 pounds overnight or 5 pounds in one  week; increasing SOB;  Fatigue; cough; chest pressure/tightness/discomfort; nausea; increase swelling in feet, ankles, abdomen, hands.     3. Take all medications as prescribed.  4. Follow low sodium diet.  Patient reported to this RN that Dr. Juleen China has instructed her on how much sodium/salt she can have as well as fluids.  Reviewed foods to avoid.  5. Exercise - remain as active as possible.    Patient will be followed by [facility] with the Heart Failure Protocol in the home.  Patient will be followed by RN, PT, SW under [facility] program per CM RN.    Roanna Epley, RN, BSN, [facility] Cardiovascular and Pulmonary Nurse Navigator

## 2017-10-11 NOTE — Progress Notes (Signed)
[facility]  ROUNDING NOTE   Subjective:   Patient feels great. No shortness of breath, no edema.    Objective:  Vital signs in last 24 hours:  Temp:  [98.2 F (36.8 C)-98.4 F (36.9 C)] 98.2 F (36.8 C) (04/01 0759) Pulse Rate:  [71-77] 77 (04/01 0759) BP: (140-159)/(53-68) 159/68 (04/01 0759) SpO2:  [95 %-99 %] 99 % (04/01 0759) Weight:  [132.3 kg (291 lb 11.2 oz)] 132.3 kg (291 lb 11.2 oz) (04/01 0551)  Weight change: -5.262 kg (-11 lb 9.6 oz) Filed Weights   10/09/17 0547 10/10/17 0442 10/11/17 0551  Weight: (!) 136.1 kg (300 lb 1.6 oz) (!) 137.6 kg (303 lb 4.8 oz) 132.3 kg (291 lb 11.2 oz)    Intake/Output: I/O last 3 completed shifts: In: 360 [P.O.:360] Out: 3200 [Urine:3200]   Intake/Output this shift:  No intake/output data recorded.  Physical Exam: General: No acute distress  Head: Normocephalic, atraumatic. Moist oral mucosal membranes  Eyes: Anicteric  Neck: Supple, trachea midline  Lungs:  CTAB, normal effort  Heart: S1S2 no rubs 2/6 SEM  Abdomen:  Soft, nontender, bowel sounds present  Extremities: no peripheral edema.  Neurologic: Awake, alert, following commands  Skin: No lesions  Access:     Basic Metabolic Panel: Recent Labs  Lab 10/05/17 0436  10/07/17 0607 10/07/17 0827 10/08/17 0637 10/09/17 0700 10/10/17 0857 10/11/17 0424  NA 137   < > 140 140 140 139 137 139  K 4.3   < > 3.8 3.8 3.8 3.5 3.7 3.6  CL 103   < > 104 105 103 103 100* 101  CO2 22   < > 23 23 24 23 22 25   GLUCOSE 264*   < > 97 77 143* 205* 273* 166*  BUN 100*   < > 100* 99* 100* 100* 99* 97*  CREATININE 2.89*   < > 2.52* 2.46* 2.39* 2.79* 2.58* 2.70*  CALCIUM 8.4*   < > 8.8* 8.9 9.0 8.7* 8.9 8.8*  MG 2.0  --  1.7  --   --   --   --   --    < > = values in this interval not displayed.    Liver Function Tests: No results for input(s): AST, ALT, ALKPHOS, BILITOT, PROT, ALBUMIN in the last 168 hours. No results for input(s): LIPASE, AMYLASE in the last  168 hours. No results for input(s): AMMONIA in the last 168 hours.  CBC: Recent Labs  Lab 10/09/17 0700  WBC 7.7  HGB 9.5*  HCT 29.7*  MCV 84.1  PLT 257    Cardiac Enzymes: No results for input(s): CKTOTAL, CKMB, CKMBINDEX, TROPONINI in the last 168 hours.  BNP: Invalid input(s): POCBNP  CBG: Recent Labs  Lab 10/10/17 0806 10/10/17 1151 10/10/17 1648 10/10/17 2054 10/11/17 0800  GLUCAP 297* 281* 6 152* 172*    Microbiology: Results for orders placed or performed during the hospital encounter of 09/14/17  MRSA PCR Screening     Status: None   Collection Time: 09/14/17  4:40 PM  Result Value Ref Range Status   MRSA by PCR NEGATIVE NEGATIVE Final    Comment:        The GeneXpert MRSA Assay (FDA approved for NASAL specimens only), is one component of a comprehensive MRSA colonization surveillance program. It is not intended to diagnose MRSA infection nor to guide or monitor treatment for MRSA infections. Performed at [facility], [address] [place], [place] [address]   CULTURE, BLOOD (ROUTINE X  2) w Reflex to ID Panel     Status: None   Collection Time: 09/14/17 11:11 PM  Result Value Ref Range Status   Specimen Description BLOOD LT [facility]  Final   Special Requests   Final    BOTTLES DRAWN AEROBIC AND ANAEROBIC Blood Culture adequate volume   Culture   Final    NO GROWTH 5 DAYS Performed at [facility], [address] [place], [place] [address]    Report Status 09/19/2017 FINAL  Final  CULTURE, BLOOD (ROUTINE X 2) w Reflex to ID Panel     Status: None   Collection Time: 09/14/17 11:37 PM  Result Value Ref Range Status   Specimen Description BLOOD RT ARM  Final   Special Requests   Final    BOTTLES DRAWN AEROBIC AND ANAEROBIC Blood Culture results may not be optimal due to an inadequate volume of blood received in culture bottles   Culture   Final    NO GROWTH 5 DAYS Performed at [facility], [address],  [place], [place] [address]    Report Status 09/19/2017 FINAL  Final  Urine Culture     Status: None   Collection Time: 09/15/17  3:18 AM  Result Value Ref Range Status   Specimen Description   Final    URINE, RANDOM Performed at [facility], [address] [place], [place] [address]    Special Requests   Final    NONE Performed at [facility], [address], [place], [place] [address]    Culture   Final    NO GROWTH Performed at [facility], [place] [address] [place],  [address]    Report Status 09/16/2017 FINAL  Final    Coagulation Studies: No results for input(s): LABPROT, INR in the last 72 hours.  Urinalysis: No results for input(s): COLORURINE, LABSPEC, PHURINE, GLUCOSEU, HGBUR, BILIRUBINUR, KETONESUR, PROTEINUR, UROBILINOGEN, NITRITE, LEUKOCYTESUR in the last 72 hours.  Invalid input(s): APPERANCEUR    Imaging: No results found.   Medications:       Assessment/ Plan:  62 y.o. female African-American femalewith chronic kidney disease stage III followed by Dr. Arty Baumgartner, [facility], hypertension, coronary artery disease, congestive heart failure with diastolic dysfunction, diabetes mellitus type II, hepatitis C chronic, peptic ulcer disease, obstructive sleep apnea  1.  Acute renal failure on chronic kidney disease stage III baseline creatinine 1.81/GFR 35 (Aug 05, 2017) 2.  Peripheral edema. 3.  Hypertension. 4.  Pulmonary edema  Plan:  - Continue torsemide  - Metolazone PRN - Fluid restriction - Low salt diet - Patient to have labs at my office this week [facility] follow up with me next week.    LOS: 8 Prentis Langdon 4/1/20197:57 PM

## 2017-10-11 NOTE — Progress Notes (Signed)
Left thigh wound markedly improved since last evaluation.  1 x 3.5 cm area of granulation tissue, clean without hypertrophy perhaps 5 mm below the adjacent skin level.  Previously the wound was 6-8 cm in depth and 4 cm in width.  Continued application of silver nitrate as an outpatient by the visiting nurse will be adequate.  No additional therapy required.

## 2017-10-11 NOTE — Progress Notes (Signed)
Patient discharged home today, prescriptions provided and education reviewed. Patient aware of follow up plan and appoinments. VSS, no complaints at this time.

## 2017-10-11 NOTE — Discharge Summary (Signed)
[facility] [place] NAME: [name]    MR#:  [id number]  DATE OF BIRTH:  [birth year]  DATE OF ADMISSION:  10/03/2017 ADMITTING PHYSICIAN: Epifanio Lesches, MD  DATE OF DISCHARGE: No discharge date for patient encounter.  PRIMARY CARE PHYSICIAN: [name], PA-C    ADMISSION DIAGNOSIS:  Anasarca [R60.1] SOB (shortness of breath) [R06.02] Hypoxia [R09.02] Dyspnea, unspecified type [R06.00]  DISCHARGE DIAGNOSIS:  Active Problems:   Acute on chronic diastolic CHF (congestive heart failure) ([place])   SECONDARY DIAGNOSIS:   Past Medical History:  Diagnosis Date  . Aortic stenosis    Echo 8/18: mean 13, peak 28, LVOT/AV mean velocity 0.51  . Asthma    As a child   . Bronchitis   . CAD (coronary artery disease)    a. 09/2016: 50% Ost 1st Mrg stenosis, 50% 2nd Mrg stenosis, 20% Mid-Cx, 95% Prox LAD, 40% mid-LAD, and 10% dist-LAD stenosis. Staged PCI with DES to Prox-LAD.   [name] Chronic combined systolic and diastolic CHF (congestive heart failure) ([place]) 2011   echo 2/18: EF 55-60, normal wall motion, grade 2 diastolic dysfunction, trivial AI // echo 3/18: Septal and apical HK, EF 45-50, normal wall motion, trivial AI, mild LAE, PASP 38 // echo 8/18: EF 60-65, normal wall motion, grade 1 diastolic dysfunction, calcified aortic valve leaflets, mild aortic stenosis (mean 13, peak 28, LVOT/AV mean velocity 0.51), mild AI, moderate MAC, mild LAE, trivial TR   . Chronic kidney disease   . Complication of anesthesia   . Diabetes mellitus Dx 1989  . Hepatitis C Dx 2013  . Hypertension Dx 1989  . Obesity   . Obstructive sleep apnea   . Pancreatitis 2013  . Refusal of blood transfusions as patient is Jehovah's Witness   . Tendinitis   . Ulcer 2010    HOSPITAL COURSE:  62 year old female with past medical history significant for chronic diastolic CHF, CAD status post stent, hypertension, morbid obesity, stage IV CK D, sleep  apnea presents to hospital secondary to dyspnea on exertion and weight gain  1. Acute on chronic diastolic CHF exacerbation Resolved Secondary to noncompliance Treated on our congestive heart failure protocol, IV Lasix drip converted to p.o. torsemide, cardiology to see patient while in house, metolazone prn edema, provided low-sodium/heart healthy diet, and patient did well Right heart catheterization as outpatient-follow-up with cardiology status post discharge  2. Acute on chronic kidney disease stage IV Nephrology did see patient while in house-no need for dialysis at this time   3. CAD-history of stents in 2018 Stable Cardiology did see patient while in house Continuedaspirin, Brilinta, Coreg, Imdur and Lipitor  4. DMtype II Stable on current regiment  5. COPDwithout exacerbation Stable Breathing treatments as needed  DISCHARGE CONDITIONS:  On the day of discharge patient is afebrile, hemodynamic stable, tolerating diet, ambulating without difficulty rec for discharge to home with appropriate follow-up with primary care provider and cardiology, for more specific details please see chart  CONSULTS OBTAINED:  Treatment Team:  Murlean Iba, MD Wellington Hampshire, MD Allyne Gee, MD  DRUG ALLERGIES:   Allergies  Allergen Reactions  . Shellfish Allergy Anaphylaxis and Swelling  . Diazepam Other (See Comments)    "felt like out of body experience"  . Morphine And Related Itching    DISCHARGE MEDICATIONS:   Allergies as of 10/11/2017      Reactions   Shellfish Allergy Anaphylaxis, Swelling   Diazepam Other (See Comments)   "  felt like out of body experience"   Morphine And Related Itching      Medication List    STOP taking these medications   amLODipine 10 MG tablet Commonly known as:  NORVASC   furosemide 40 MG tablet Commonly known as:  LASIX     TAKE these medications   ACCU-CHEK AVIVA device Use as instructed daily.   accu-chek softclix  lancets Use as instructed daily.   albuterol (2.5 MG/3ML) 0.083% nebulizer solution Commonly known as:  PROVENTIL Take 3 mLs (2.5 mg total) by nebulization every 6 (six) hours as needed for wheezing or shortness of breath.   albuterol 108 (90 Base) MCG/ACT inhaler Commonly known as:  VENTOLIN HFA Inhale 2 puffs into the lungs every 4 (four) hours as needed for wheezing or shortness of breath.   allopurinol 100 MG tablet Commonly known as:  ZYLOPRIM Take 1 tablet (100 mg total) by mouth daily.   aspirin 81 MG EC tablet Take 1 tablet (81 mg total) by mouth daily.   atorvastatin 80 MG tablet Commonly known as:  LIPITOR Take 1 tablet (80 mg total) by mouth daily at 6 PM.   carvedilol 25 MG tablet Commonly known as:  COREG TAKE 1 TABLET BY MOUTH 2 TIMES DAILY WITH A MEAL.   docusate sodium 100 MG capsule Commonly known as:  COLACE Take 1 capsule (100 mg total) by mouth 2 (two) times daily as needed for mild constipation.   Dulaglutide 0.75 MG/0.5ML Sopn Commonly known as:  TRULICITY Inject 2.37 mg into the skin once a week.   ferrous sulfate 325 (65 FE) MG tablet Take 1 tablet (325 mg total) by mouth 2 (two) times daily with a meal.   fluticasone 50 MCG/ACT nasal spray Commonly known as:  FLONASE Place 2 sprays into both nostrils daily as needed for allergies or rhinitis.   Fluticasone-Salmeterol 250-50 MCG/DOSE Aepb Commonly known as:  ADVAIR DISKUS Inhale 1 puff into the lungs 2 (two) times daily.   glucose blood test strip Commonly known as:  TRUE METRIX BLOOD GLUCOSE TEST Use as instructed   glucose blood test strip Commonly known as:  ACCU-CHEK AVIVA Use as instructed daily   hydrALAZINE 50 MG tablet Commonly known as:  APRESOLINE Take 1 tablet (50 mg total) by mouth every 8 (eight) hours. What changed:    medication strength  how much to take  when to take this  Another medication with the same name was removed. Continue taking this medication, and  follow the directions you see here.   hydrOXYzine 50 MG tablet Commonly known as:  ATARAX/VISTARIL Take 1 tablet (50 mg total) by mouth 3 (three) times daily as needed for itching.   insulin aspart 100 UNIT/ML FlexPen Commonly known as:  NOVOLOG Inject 10 Units into the skin 3 (three) times daily with meals.   Insulin Glargine 100 UNIT/ML Solostar Pen Commonly known as:  LANTUS SOLOSTAR Inject 48 Units into the skin 2 (two) times daily.   isosorbide mononitrate 30 MG 24 hr tablet Commonly known as:  IMDUR Take 3 tablets (90 mg total) by mouth daily.   ketotifen 0.025 % ophthalmic solution Commonly known as:  ZADITOR Place 1 drop into both eyes daily as needed (for dry eyes).   loratadine 10 MG tablet Commonly known as:  CLARITIN Take 1 tablet (10 mg total) by mouth daily.   metolazone 2.5 MG tablet Commonly known as:  ZAROXOLYN Take 1 tablet (2.5 mg total) by mouth daily as needed (swelling).  MITIGARE 0.6 MG Caps Generic drug:  Colchicine Take 1 capsule by mouth daily.   montelukast 10 MG tablet Commonly known as:  SINGULAIR Take 1 tablet (10 mg total) by mouth at bedtime.   nitroGLYCERIN 0.4 MG SL tablet Commonly known as:  NITROSTAT Place 1 tablet (0.4 mg total) under the tongue every 5 (five) minutes x 3 doses as needed for chest pain.   omeprazole 20 MG capsule Commonly known as:  PRILOSEC Take 1 capsule (20 mg total) by mouth daily.   oxyCODONE-acetaminophen 5-325 MG tablet Commonly known as:  PERCOCET/ROXICET Take 1 tablet by mouth every 6 (six) hours as needed for severe pain.   potassium chloride SA 20 MEQ tablet Commonly known as:  K-DUR,KLOR-CON Take 1 tablet (20 mEq total) by mouth 2 (two) times daily.   pregabalin 100 MG capsule Commonly known as:  LYRICA Take 1 capsule (100 mg total) by mouth 3 (three) times daily.   ticagrelor 90 MG Tabs tablet Commonly known as:  BRILINTA Take 1 tablet (90 mg total) by mouth 2 (two) times daily.    torsemide 20 MG tablet Commonly known as:  DEMADEX Take 2 tablets (40 mg total) by mouth 2 (two) times daily. What changed:  how much to take   valACYclovir 500 MG tablet Commonly known as:  VALTREX Take 1 tablet (500 mg total) by mouth daily.   Vitamin D (Ergocalciferol) 50000 units Caps capsule Commonly known as:  DRISDOL Take 1 capsule (50,000 Units total) by mouth every 30 (thirty) days.        DISCHARGE INSTRUCTIONS:   If you experience worsening of your admission symptoms, develop shortness of breath, life threatening emergency, suicidal or homicidal thoughts you must seek medical attention immediately by calling 911 or calling your MD immediately  if symptoms less severe.  You Must read complete instructions/literature along with all the possible adverse reactions/side effects for all the Medicines you take and that have been prescribed to you. Take any new Medicines after you have completely understood and accept all the possible adverse reactions/side effects.   Please note  You were cared for by a hospitalist during your hospital stay. If you have any questions about your discharge medications or the care you received while you were in the hospital after you are discharged, you can call the unit and asked to speak with the hospitalist on call if the hospitalist that took care of you is not available. Once you are discharged, your primary care physician will handle any further medical issues. Please note that NO REFILLS for any discharge medications will be authorized once you are discharged, as it is imperative that you return to your primary care physician (or establish a relationship with a primary care physician if you do not have one) for your aftercare needs so that they can reassess your need for medications and monitor your lab values.    Today   CHIEF COMPLAINT:   Chief Complaint  Patient presents with  . Shortness of Breath  . Facial Swelling    HISTORY OF  PRESENT ILLNESS:  Minetta Krisher  is a 62 y.o. female with a known history chronic systolic, diastolic heart failure, CAD, diabetes mellitus type 2, hep C, hypertension, obstructive sleep apnea because of shortness of breath since Thursday getting progressively worse associated with 6 pound weight gain since Thursday, orthopnea, PND, pedal edema, worsening shortness of breath.  Cannot hypoxic with sats of upper 80s on room air and now she is on 3  L and saturation patient has history of stage IV kidney disease.  Admitted for times in the last 6 months.  Charged on March 11.  Patient received Lasix drip during the last admission.  generalized body swelling including face  VITAL SIGNS:  Blood pressure (!) 159/68, pulse 77, temperature 98.2 F (36.8 C), temperature source Oral, resp. rate 18, height _0  (1.727 m), weight 132.3 kg (291 lb 11.2 oz), SpO2 99 %.  I/O:    Intake/Output Summary (Last 24 hours) at 10/11/2017 1050 Last data filed at 10/11/2017 0601 Gross per 24 hour  Intake 240 ml  Output 3200 ml  Net -2960 ml    PHYSICAL EXAMINATION:  GENERAL:  62 y.o.-year-old patient lying in the bed with no acute distress.  EYES: Pupils equal, round, reactive to light and accommodation. No scleral icterus. Extraocular muscles intact.  HEENT: Head atraumatic, normocephalic. Oropharynx and nasopharynx clear.  NECK:  Supple, no jugular venous distention. No thyroid enlargement, no tenderness.  LUNGS: Normal breath sounds bilaterally, no wheezing, rales,rhonchi or crepitation. No use of accessory muscles of respiration.  CARDIOVASCULAR: S1, S2 normal. No murmurs, rubs, or gallops.  ABDOMEN: Soft, non-tender, non-distended. Bowel sounds present. No organomegaly or mass.  EXTREMITIES: No pedal edema, cyanosis, or clubbing.  NEUROLOGIC: Cranial nerves II through XII are intact. Muscle strength 5/5 in all extremities. Sensation intact. Gait not checked.  PSYCHIATRIC: The patient is alert and oriented x 3.   SKIN: No obvious rash, lesion, or ulcer.   DATA REVIEW:   CBC Recent Labs  Lab 10/09/17 0700  WBC 7.7  HGB 9.5*  HCT 29.7*  PLT 257    Chemistries  Recent Labs  Lab 10/07/17 0607  10/11/17 0424  NA 140   < > 139  K 3.8   < > 3.6  CL 104   < > 101  CO2 23   < > 25  GLUCOSE 97   < > 166*  BUN 100*   < > 97*  CREATININE 2.52*   < > 2.70*  CALCIUM 8.8*   < > 8.8*  MG 1.7  --   --    < > = values in this interval not displayed.    Cardiac Enzymes No results for input(s): TROPONINI in the last 168 hours.  Microbiology Results  Results for orders placed or performed during the hospital encounter of 09/14/17  MRSA PCR Screening     Status: None   Collection Time: 09/14/17  4:40 PM  Result Value Ref Range Status   MRSA by PCR NEGATIVE NEGATIVE Final    Comment:        The GeneXpert MRSA Assay (FDA approved for NASAL specimens only), is one component of a comprehensive MRSA colonization surveillance program. It is not intended to diagnose MRSA infection nor to guide or monitor treatment for MRSA infections. Performed at [facility], [address] [place], [place] [address]   CULTURE, BLOOD (ROUTINE X 2) w Reflex to ID Panel     Status: None   Collection Time: 09/14/17 11:11 PM  Result Value Ref Range Status   Specimen Description BLOOD LT [facility]  Final   Special Requests   Final    BOTTLES DRAWN AEROBIC AND ANAEROBIC Blood Culture adequate volume   Culture   Final    NO GROWTH 5 DAYS Performed at [facility], [address], [place], [name] [address]    Report Status 09/19/2017 FINAL  Final  CULTURE, BLOOD (ROUTINE X 2)  w Reflex to ID Panel     Status: None   Collection Time: 09/14/17 11:37 PM  Result Value Ref Range Status   Specimen Description BLOOD RT ARM  Final   Special Requests   Final    BOTTLES DRAWN AEROBIC AND ANAEROBIC Blood Culture results may not be optimal due to an inadequate volume of blood received in culture bottles    Culture   Final    NO GROWTH 5 DAYS Performed at [facility], [address], [place], [place] [address]    Report Status 09/19/2017 FINAL  Final  Urine Culture     Status: None   Collection Time: 09/15/17  3:18 AM  Result Value Ref Range Status   Specimen Description   Final    URINE, RANDOM Performed at [facility], [address], [place], [place] [address]    Special Requests   Final    NONE Performed at [facility], [address] [place], [place] [address]    Culture   Final    NO GROWTH Performed at [facility], [place] [address], [place], [place] [address]    Report Status 09/16/2017 FINAL  Final    RADIOLOGY:  No results found.  EKG:   Orders placed or performed during the hospital encounter of 10/03/17  . ED EKG within 10 minutes  . ED EKG within 10 minutes  . ED EKG  . ED EKG  . EKG 12-Lead  . EKG 12-Lead      Management plans discussed with the patient, family and they are in agreement.  CODE STATUS:     Code Status Orders  (From admission, onward)        Start     Ordered   10/03/17 1802  Full code  Continuous     10/03/17 1803    Code Status History    Date Active Date Inactive Code Status Order ID Comments User Context   09/14/2017 1310 09/20/2017 1434 Full Code [id number]  Max Sane, MD ED   08/15/2017 2306 08/19/2017 2039 Full Code [id number]  Robert Bellow, MD Inpatient   08/11/2017 1350 08/15/2017 2306 Full Code [id number]  Demetrios Loll, MD Inpatient   06/02/2017 0234 06/06/2017 1536 Full Code [id number]  Caren Griffins, MD Inpatient   04/28/2017 0516 05/02/2017 1624 Full Code [id number]  Rise Patience, MD ED   03/17/2017 1222 03/19/2017 2030 Full Code [id number]  Liliane Shi, PA-C ED   12/13/2016 0345 12/14/2016 1647 Full Code [id number]  Reubin Milan, MD Inpatient   09/13/2016 1149 09/19/2016 1458 Full Code [id number]  Flossie Dibble, MD ED   09/04/2016 0137 09/05/2016 1801 Full Code [id number]   Etta Quill, DO ED   08/21/2016 1846 08/22/2016 1733 Full Code [id number]  Thurnell Lose, MD Inpatient   03/18/2015 0939 03/19/2015 1802 Full Code [id number]  Dellia Nims, MD Inpatient   02/15/2015 1700 02/18/2015 1817 Full Code [id number]  Francesca Oman, DO Inpatient   10/02/2014 1524 10/04/2014 1649 Full Code [id number]  Samella Parr, NP Inpatient   03/25/2012 0531 04/03/2012 1931 Full Code [id number]  Maudry Diego, RN Inpatient      TOTAL TIME TAKING CARE OF THIS PATIENT: 45 minutes.    Avel Peace Neeley Sedivy M.D on 10/11/2017 at 10:50 AM  Between 7am to 6pm - Pager - [phone]  After 6pm go to [email or web address] - password [email or web address] [facility]  Office  [phone]  CC:  Primary care physician; [name], PA-C   Note: This dictation was prepared with Dragon dictation along with smaller phrase technology. Any transcriptional errors that result from this process are unintentional.

## 2017-10-12 ENCOUNTER — Telehealth: Payer: Self-pay

## 2017-10-13 ENCOUNTER — Inpatient Hospital Stay: Payer: Medicaid Other | Admitting: Physician Assistant

## 2017-10-13 DIAGNOSIS — I10 Essential (primary) hypertension: Secondary | ICD-10-CM

## 2017-10-13 NOTE — Progress Notes (Signed)
Patient ID: [name], female    DOB: [birth year], 62 y.o.   MRN: [id number]  HPI  [name] is a 62 y/o female with a history of asthma, CAD, DM, HTN, CKD, hepatitis C, pancreatitis, obstructive sleep apnea, previous tobacco use and chronic heart failure.   Echo report from 09/15/17 reviewed and showed an EF of 65-70% along with moderate AS, trivial AR and calcified mitral valve annulus. Cardiac catheterization done 04/29/17 which showed a widely patient mid LAD stent and normal left ventricular filling pressure.   Admitted 10/03/17 due to acute on chronic HF due to noncompliance. Cardiology consult obtained. Initially needed IV lasix and then transitioned to oral diuretics. Nephrology also saw patient and decided she didn't need dialysis at this time. Discharged after 8 days. Admitted 09/14/17 due to acute on chronic HF. Initially needed lasix drip and then transitioned to oral lasix. Cardiology and pulmonology consults were obtained. Had acute kidney injury which did improve. Discussed with nephrology. Discharged after 6 days.   She presents today for a follow-up visit with a chief complaint of minimal fatigue upon moderate exertion. She says this has been present for several years but has recently improved. She has associated blurry vision, headaches and edema. She denies any difficulty sleeping, abdominal distention, shortness of breath, chest pain, dizziness or weight gain.   Past Medical History:  Diagnosis Date  . Aortic stenosis    Echo 8/18: mean 13, peak 28, LVOT/AV mean velocity 0.51  . Asthma    As a child   . Bronchitis   . CAD (coronary artery disease)    a. 09/2016: 50% Ost 1st Mrg stenosis, 50% 2nd Mrg stenosis, 20% Mid-Cx, 95% Prox LAD, 40% mid-LAD, and 10% dist-LAD stenosis. Staged PCI with DES to Prox-LAD.   [name] Chronic combined systolic and diastolic CHF (congestive heart failure) ([place]) 2011   echo 2/18: EF 55-60, normal wall motion, grade 2 diastolic dysfunction, trivial AI //  echo 3/18: Septal and apical HK, EF 45-50, normal wall motion, trivial AI, mild LAE, PASP 38 // echo 8/18: EF 60-65, normal wall motion, grade 1 diastolic dysfunction, calcified aortic valve leaflets, mild aortic stenosis (mean 13, peak 28, LVOT/AV mean velocity 0.51), mild AI, moderate MAC, mild LAE, trivial TR   . Chronic kidney disease   . Complication of anesthesia   . Diabetes mellitus Dx 1989  . Hepatitis C Dx 2013  . Hypertension Dx 1989  . Obesity   . Obstructive sleep apnea   . Pancreatitis 2013  . Refusal of blood transfusions as patient is Jehovah's Witness   . Tendinitis   . Ulcer 2010   Past Surgical History:  Procedure Laterality Date  . APPLICATION OF WOUND VAC Left 08/14/2017   Procedure: APPLICATION OF WOUND VAC Exchange;  Surgeon: Robert Bellow, MD;  Location: [facility];  Service: General;  Laterality: Left;  . CHOLECYSTECTOMY    . CORONARY STENT INTERVENTION N/A 09/18/2016   Procedure: Coronary Stent Intervention;  Surgeon: Troy Sine, MD;  Location: [facility];  Service: Cardiovascular;  Laterality: N/A;  . DRESSING CHANGE UNDER ANESTHESIA Left 08/15/2017   Procedure: exploration of wound for bleeding;  Surgeon: Robert Bellow, MD;  Location: [facility];  Service: General;  Laterality: Left;  . EYE SURGERY    . INCISION AND DRAINAGE ABSCESS Left 08/12/2017   Procedure: INCISION AND DRAINAGE ABSCESS;  Surgeon: Robert Bellow, MD;  Location: [facility];  Service: General;  Laterality: Left;  .  KNEE ARTHROSCOPY    . LEFT HEART CATH N/A 09/18/2016   Procedure: Left Heart Cath;  Surgeon: Troy Sine, MD;  Location: [facility];  Service: Cardiovascular;  Laterality: N/A;  . LEFT HEART CATH AND CORONARY ANGIOGRAPHY N/A 09/16/2016   Procedure: Left Heart Cath and Coronary Angiography;  Surgeon: Burnell Blanks, MD;  Location: [facility];  Service: Cardiovascular;  Laterality: N/A;  . LEFT HEART CATH AND CORONARY ANGIOGRAPHY N/A 04/29/2017    Procedure: LEFT HEART CATH AND CORONARY ANGIOGRAPHY;  Surgeon: Nelva Bush, MD;  Location: [facility];  Service: Cardiovascular;  Laterality: N/A;  . TUBAL LIGATION    . TUBAL LIGATION     Family History  Problem Relation Age of Onset  . Colon cancer Mother   . Heart attack Other   . Heart attack Maternal Grandmother   . Hypertension Sister   . Hypertension Brother   . Diabetes Paternal Grandmother   . Breast cancer Neg Hx    Social History   Tobacco Use  . Smoking status: Former Smoker    Last attempt to quit: 10/25/1980    Years since quitting: 36.9  . Smokeless tobacco: Never Used  . Tobacco comment: quit smoking in 1982  Substance Use Topics  . Alcohol use: Yes    Comment: 3 times in last year   Allergies  Allergen Reactions  . Shellfish Allergy Anaphylaxis and Swelling  . Diazepam Other (See Comments)    "felt like out of body experience"  . Morphine And Related Itching   Prior to Admission medications   Medication Sig Start Date End Date Taking? Authorizing Provider  albuterol (PROVENTIL) (2.5 MG/3ML) 0.083% nebulizer solution Take 3 mLs (2.5 mg total) by nebulization every 6 (six) hours as needed for wheezing or shortness of breath. 09/01/17  Yes [name], PA-C  albuterol (VENTOLIN HFA) 108 (90 Base) MCG/ACT inhaler Inhale 2 puffs into the lungs every 4 (four) hours as needed for wheezing or shortness of breath. 09/01/17  Yes [name], PA-C  allopurinol (ZYLOPRIM) 300 MG tablet Take 1 tablet (300 mg total) by mouth daily. 10/14/17  Yes [name], PA-C  aspirin EC 81 MG EC tablet Take 1 tablet (81 mg total) by mouth daily. 09/19/16  Yes Strader, , PA-C  atorvastatin (LIPITOR) 80 MG tablet Take 1 tablet (80 mg total) by mouth daily at 6 PM. 09/24/17  Yes Burnette, Clearnce Sorrel, PA-C  Blood Glucose Monitoring Suppl (ACCU-CHEK AVIVA) device Use as instructed daily. 04/14/17  Yes Charlott Rakes, MD  Blood Pressure  Monitoring (ADULT BLOOD PRESSURE CUFF LG) KIT Check blood pressure once daily 10/14/17  Yes Burnette, Jennifer M, PA-C  carvedilol (COREG) 25 MG tablet TAKE 1 TABLET BY MOUTH 2 TIMES DAILY WITH A MEAL. 10/14/17  Yes [name] M, PA-C  docusate sodium (COLACE) 100 MG capsule Take 1 capsule (100 mg total) by mouth 2 (two) times daily as needed for mild constipation. 09/20/17  Yes Gouru, Aruna, MD  Dulaglutide (TRULICITY) 3.29 JJ/8.8CZ SOPN Inject 0.75 mg into the skin once a week. 09/28/17  Yes [name], PA-C  ferrous sulfate 325 (65 FE) MG tablet Take 1 tablet (325 mg total) by mouth 2 (two) times daily with a meal. 10/14/17  Yes Burnette, Anderson Malta M, PA-C  fluticasone (FLONASE) 50 MCG/ACT nasal spray Place 2 sprays into both nostrils daily as needed for allergies or rhinitis. 09/28/17  Yes [name], PA-C  Fluticasone-Salmeterol (  ADVAIR DISKUS) 250-50 MCG/DOSE AEPB Inhale 1 puff into the lungs 2 (two) times daily. 09/01/17  Yes [name] M, PA-C  glucose blood (ACCU-CHEK AVIVA) test strip Use as instructed daily 04/14/17  Yes Newlin, Charlane Ferretti, MD  glucose blood (TRUE METRIX BLOOD GLUCOSE TEST) test strip Use as instructed 03/19/17  Yes Barrett, Rhonda G, PA-C  hydrALAZINE (APRESOLINE) 50 MG tablet Take 1 tablet (50 mg total) by mouth every 8 (eight) hours. 10/11/17  Yes Salary, Avel Peace, MD  hydrOXYzine (ATARAX/VISTARIL) 50 MG tablet Take 1 tablet (50 mg total) by mouth 3 (three) times daily as needed for itching. 09/24/17  Yes [name], PA-C  insulin aspart (NOVOLOG) 100 UNIT/ML FlexPen Inject 10 Units into the skin 3 (three) times daily with meals. 09/20/17  Yes Gouru, Illene Silver, MD  Insulin Glargine (LANTUS SOLOSTAR) 100 UNIT/ML Solostar Pen Inject 48 Units into the skin 2 (two) times daily. 08/19/17  Yes [name], MD  isosorbide mononitrate (IMDUR) 30 MG 24 hr tablet Take 3 tablets (90 mg total) by mouth daily. 09/21/17  Yes Gouru, Illene Silver, MD  ketotifen (ZADITOR)  0.025 % ophthalmic solution Place 1 drop into both eyes daily as needed (for dry eyes). 09/01/17  Yes [name], PA-C  Lancet Devices (ACCU-CHEK [place]) lancets Use as instructed daily. 04/14/17  Yes Charlott Rakes, MD  loratadine (CLARITIN) 10 MG tablet Take 1 tablet (10 mg total) by mouth daily. 09/28/17  Yes [name], PA-C  metolazone (ZAROXOLYN) 2.5 MG tablet Take 1 tablet (2.5 mg total) by mouth daily as needed (swelling). 10/11/17  Yes Salary, Avel Peace, MD  MITIGARE 0.6 MG CAPS Take 1 capsule by mouth daily as needed. 10/14/17  Yes [name] M, PA-C  montelukast (SINGULAIR) 10 MG tablet Take 1 tablet (10 mg total) by mouth at bedtime. 09/28/17  Yes [name], PA-C  nitroGLYCERIN (NITROSTAT) 0.4 MG SL tablet Place 1 tablet (0.4 mg total) under the tongue every 5 (five) minutes x 3 doses as needed for chest pain. 03/19/17  Yes Barrett, Evelene Croon, PA-C  omeprazole (PRILOSEC) 20 MG capsule Take 1 capsule (20 mg total) by mouth daily. 09/24/17  Yes [name] M, PA-C  potassium chloride SA (K-DUR,KLOR-CON) 20 MEQ tablet Take 1 tablet (20 mEq total) by mouth 2 (two) times daily. 10/11/17  Yes Salary, Avel Peace, MD  pregabalin (LYRICA) 100 MG capsule Take 1 capsule (100 mg total) by mouth 3 (three) times daily. 10/14/17  Yes [name], PA-C  ticagrelor (BRILINTA) 90 MG TABS tablet Take 1 tablet (90 mg total) by mouth 2 (two) times daily. 10/14/17  Yes [name], PA-C  torsemide (DEMADEX) 20 MG tablet Take 2 tablets (40 mg total) by mouth 2 (two) times daily. 10/11/17  Yes Salary, Avel Peace, MD  valACYclovir (VALTREX) 500 MG tablet Take 1 tablet (500 mg total) by mouth daily. 10/14/17  Yes [name], PA-C  Vitamin D, Ergocalciferol, (DRISDOL) 50000 units CAPS capsule Take 1 capsule (50,000 Units total) by mouth every 30 (thirty) days. 10/14/17  Yes [name], PA-C  gabapentin (NEURONTIN) 100 MG capsule Take 1 capsule (100 mg  total) by mouth 3 (three) times daily. 08/19/17 09/01/17  [name], MD    Review of Systems  Constitutional: Positive for fatigue. Negative for appetite change.  HENT: Negative for congestion, postnasal drip and sore throat.   Eyes: Positive for visual disturbance (blurry vision).  Respiratory: Negative for chest tightness and shortness of breath.  Cardiovascular: Positive for leg swelling (better). Negative for chest pain and palpitations.  Gastrointestinal: Negative for abdominal distention and abdominal pain.  Endocrine: Negative.   Genitourinary: Negative.   Musculoskeletal: Positive for arthralgias (left leg). Negative for back pain and neck pain.  Skin: Negative.   Allergic/Immunologic: Negative.   Neurological: Positive for headaches. Negative for dizziness and light-headedness.  Hematological: Negative for adenopathy. Does not bruise/bleed easily.  Psychiatric/Behavioral: Negative for dysphoric mood and sleep disturbance (sleeping on 2 pillows). The patient is not nervous/anxious.    Vitals:   10/14/17 1239  BP: (!) 157/68  Pulse: 84  Resp: 18  SpO2: 99%  Weight: 296 lb 2 oz (134.3 kg)  Height: 5' 8" (1.727 m)   Wt Readings from Last 3 Encounters:  10/14/17 296 lb 2 oz (134.3 kg)  10/14/17 296 lb 6.4 oz (134.4 kg)  10/11/17 291 lb 11.2 oz (132.3 kg)   Lab Results  Component Value Date   CREATININE 2.70 (H) 10/11/2017   CREATININE 2.58 (H) 10/10/2017   CREATININE 2.79 (H) 10/09/2017   Physical Exam  Constitutional: She is oriented to person, place, and time. She appears well-developed and well-nourished.  HENT:  Head: Normocephalic and atraumatic.  Neck: Normal range of motion. Neck supple.  Cardiovascular: Normal rate and regular rhythm.  Pulmonary/Chest: Effort normal. She has no wheezes. She has no rales.  Abdominal: Soft. She exhibits no distension. There is no tenderness.  Musculoskeletal: She exhibits edema (1+ pitting edema in bilateral lower legs). She  exhibits no tenderness.  Neurological: She is alert and oriented to person, place, and time.  Skin: Skin is warm and dry.  Psychiatric: She has a normal mood and affect. Her behavior is normal. Thought content normal.  Nursing note and vitals reviewed.  Assessment & Plan:  1: Chronic heart failure with preserved ejection fraction- - NYHA class II - euvolemic today - weighing daily and has noticed a gradual weight loss. Reminded to call for an overnight weight gain of >2 pounds or a weekly weight gain of >5 pounds - weight down 16 pounds from 09/30/17 - not adding salt. Reminded to closely follow a 2072m sodium diet - saw cardiology (Bhagat) 08/05/17   2: HTN- - BP ok today - saw PCP (Joette Catching earlier today - BMP on 10/11/17 reviewed and showed sodium 139, potassium 3.6 and GFR 21  3: Diabetes- - fasting glucose at home was 228 - sees nephrology ([facility]  - has an eye appointment later today regarding her worsening vision  4: Lymphedema- - stage 2 - edema much better since diuretic has been changed - currently limited in exercise due to her shortness of breath - should edema persist, consider lymphapress compression boots  Patient did not bring her medications nor a list. Each medication was verbally reviewed with the patient and she was encouraged to bring the bottles to every visit to confirm accuracy of list.  Return in 1 month or sooner for any questions/problems before then.

## 2017-10-14 ENCOUNTER — Encounter: Payer: Self-pay | Admitting: Family

## 2017-10-14 ENCOUNTER — Ambulatory Visit (INDEPENDENT_AMBULATORY_CARE_PROVIDER_SITE_OTHER): Payer: Medicaid Other | Admitting: Physician Assistant

## 2017-10-14 ENCOUNTER — Encounter: Payer: Self-pay | Admitting: Physician Assistant

## 2017-10-14 ENCOUNTER — Ambulatory Visit: Payer: Medicaid Other | Attending: Family | Admitting: Family

## 2017-10-14 VITALS — BP 160/80 | HR 94 | Temp 97.9°F | Resp 16 | Ht 68.0 in | Wt 296.4 lb

## 2017-10-14 VITALS — BP 157/68 | HR 84 | Resp 18 | Ht 68.0 in | Wt 296.1 lb

## 2017-10-14 DIAGNOSIS — Z9889 Other specified postprocedural states: Secondary | ICD-10-CM | POA: Diagnosis not present

## 2017-10-14 DIAGNOSIS — I509 Heart failure, unspecified: Secondary | ICD-10-CM | POA: Insufficient documentation

## 2017-10-14 DIAGNOSIS — M1A39X0 Chronic gout due to renal impairment, multiple sites, without tophus (tophi): Secondary | ICD-10-CM | POA: Diagnosis not present

## 2017-10-14 DIAGNOSIS — I890 Lymphedema, not elsewhere classified: Secondary | ICD-10-CM | POA: Diagnosis not present

## 2017-10-14 DIAGNOSIS — E1165 Type 2 diabetes mellitus with hyperglycemia: Secondary | ICD-10-CM

## 2017-10-14 DIAGNOSIS — E1122 Type 2 diabetes mellitus with diabetic chronic kidney disease: Secondary | ICD-10-CM | POA: Diagnosis not present

## 2017-10-14 DIAGNOSIS — G629 Polyneuropathy, unspecified: Secondary | ICD-10-CM

## 2017-10-14 DIAGNOSIS — I130 Hypertensive heart and chronic kidney disease with heart failure and stage 1 through stage 4 chronic kidney disease, or unspecified chronic kidney disease: Secondary | ICD-10-CM | POA: Diagnosis present

## 2017-10-14 DIAGNOSIS — N183 Chronic kidney disease, stage 3 unspecified: Secondary | ICD-10-CM

## 2017-10-14 DIAGNOSIS — E559 Vitamin D deficiency, unspecified: Secondary | ICD-10-CM

## 2017-10-14 DIAGNOSIS — E118 Type 2 diabetes mellitus with unspecified complications: Secondary | ICD-10-CM

## 2017-10-14 DIAGNOSIS — Z794 Long term (current) use of insulin: Secondary | ICD-10-CM | POA: Diagnosis not present

## 2017-10-14 DIAGNOSIS — N184 Chronic kidney disease, stage 4 (severe): Secondary | ICD-10-CM

## 2017-10-14 DIAGNOSIS — I2510 Atherosclerotic heart disease of native coronary artery without angina pectoris: Secondary | ICD-10-CM

## 2017-10-14 DIAGNOSIS — Z9049 Acquired absence of other specified parts of digestive tract: Secondary | ICD-10-CM | POA: Diagnosis not present

## 2017-10-14 DIAGNOSIS — Z01419 Encounter for gynecological examination (general) (routine) without abnormal findings: Secondary | ICD-10-CM

## 2017-10-14 DIAGNOSIS — A6000 Herpesviral infection of urogenital system, unspecified: Secondary | ICD-10-CM | POA: Diagnosis not present

## 2017-10-14 DIAGNOSIS — Z7982 Long term (current) use of aspirin: Secondary | ICD-10-CM | POA: Insufficient documentation

## 2017-10-14 DIAGNOSIS — Z9851 Tubal ligation status: Secondary | ICD-10-CM | POA: Diagnosis not present

## 2017-10-14 DIAGNOSIS — I10 Essential (primary) hypertension: Secondary | ICD-10-CM

## 2017-10-14 DIAGNOSIS — IMO0002 Reserved for concepts with insufficient information to code with codable children: Secondary | ICD-10-CM

## 2017-10-14 DIAGNOSIS — Z79899 Other long term (current) drug therapy: Secondary | ICD-10-CM | POA: Diagnosis not present

## 2017-10-14 DIAGNOSIS — N189 Chronic kidney disease, unspecified: Secondary | ICD-10-CM | POA: Diagnosis not present

## 2017-10-14 DIAGNOSIS — I5042 Chronic combined systolic (congestive) and diastolic (congestive) heart failure: Secondary | ICD-10-CM | POA: Diagnosis not present

## 2017-10-14 DIAGNOSIS — Z87891 Personal history of nicotine dependence: Secondary | ICD-10-CM | POA: Insufficient documentation

## 2017-10-14 DIAGNOSIS — I5032 Chronic diastolic (congestive) heart failure: Secondary | ICD-10-CM

## 2017-10-14 DIAGNOSIS — D631 Anemia in chronic kidney disease: Secondary | ICD-10-CM

## 2017-10-14 LAB — HM DIABETES EYE EXAM

## 2017-10-14 MED ORDER — VITAMIN D (ERGOCALCIFEROL) 1.25 MG (50000 UNIT) PO CAPS: 50000.0000 [IU] | ORAL_CAPSULE | ORAL | 1 refills | Status: DC

## 2017-10-14 MED ORDER — PREGABALIN 100 MG PO CAPS: 100.0000 mg | ORAL_CAPSULE | Freq: Three times a day (TID) | ORAL | 1 refills | Status: DC

## 2017-10-14 MED ORDER — CARVEDILOL 25 MG PO TABS: ORAL_TABLET | ORAL | 3 refills | Status: DC

## 2017-10-14 MED ORDER — ADULT BLOOD PRESSURE CUFF LG KIT
PACK | 0 refills | Status: DC
Start: 2017-10-14 — End: 2018-05-09

## 2017-10-14 MED ORDER — MITIGARE 0.6 MG PO CAPS: 1.0000 | ORAL_CAPSULE | Freq: Every day | ORAL | 1 refills | Status: DC | PRN

## 2017-10-14 MED ORDER — ALLOPURINOL 300 MG PO TABS: 300.0000 mg | ORAL_TABLET | Freq: Every day | ORAL | 1 refills | Status: DC

## 2017-10-14 MED ORDER — TICAGRELOR 90 MG PO TABS: 90.0000 mg | ORAL_TABLET | Freq: Two times a day (BID) | ORAL | 3 refills | Status: DC

## 2017-10-14 MED ORDER — FERROUS SULFATE 325 (65 FE) MG PO TABS: 325.0000 mg | ORAL_TABLET | Freq: Two times a day (BID) | ORAL | 3 refills | Status: DC

## 2017-10-14 MED ORDER — VALACYCLOVIR HCL 500 MG PO TABS: 500.0000 mg | ORAL_TABLET | Freq: Every day | ORAL | 3 refills | Status: DC

## 2017-10-14 NOTE — Progress Notes (Signed)
Patient: [name] Female    DOB: [birth year]   62 y.o.   MRN: [id number] Visit Date: 10/14/2017  Today's Provider: [name], PA-C   Chief Complaint  Patient presents with  . Hospitalization Follow-up   Subjective:    HPI   Follow up Hospitalization  Patient was admitted to [facility] on 10/03/17 and discharged on 10/11/17 She was treated for acute on chronic heart failure and acute on chronic renal failure. Treatment for this included IV lasix. Changed oral lasix to torsemide as outpatient.  Telephone follow up was attempted on 10/12/17. Unable to contact patient.  She reports good compliance with treatment. She reports this condition is Improved. ------------------------------------------------------------------------------------    Allergies  Allergen Reactions  . Shellfish Allergy Anaphylaxis and Swelling  . Diazepam Other (See Comments)    "felt like out of body experience"  . Morphine And Related Itching     Current Outpatient Medications:  .  albuterol (PROVENTIL) (2.5 MG/3ML) 0.083% nebulizer solution, Take 3 mLs (2.5 mg total) by nebulization every 6 (six) hours as needed for wheezing or shortness of breath., Disp: 150 mL, Rfl: 1 .  albuterol (VENTOLIN HFA) 108 (90 Base) MCG/ACT inhaler, Inhale 2 puffs into the lungs every 4 (four) hours as needed for wheezing or shortness of breath., Disp: 54 g, Rfl: 3 .  allopurinol (ZYLOPRIM) 100 MG tablet, Take 1 tablet (100 mg total) by mouth daily., Disp: 90 tablet, Rfl: 3 .  aspirin EC 81 MG EC tablet, Take 1 tablet (81 mg total) by mouth daily., Disp: , Rfl:  .  atorvastatin (LIPITOR) 80 MG tablet, Take 1 tablet (80 mg total) by mouth daily at 6 PM., Disp: 90 tablet, Rfl: 3 .  Blood Glucose Monitoring Suppl (ACCU-CHEK AVIVA) device, Use as instructed daily., Disp: 1 each, Rfl: 0 .  carvedilol (COREG) 25 MG tablet, TAKE 1 TABLET BY MOUTH 2 TIMES DAILY WITH A MEAL., Disp: 180 tablet, Rfl: 3 .  docusate sodium  (COLACE) 100 MG capsule, Take 1 capsule (100 mg total) by mouth 2 (two) times daily as needed for mild constipation., Disp: 30 capsule, Rfl: 0 .  Dulaglutide (TRULICITY) 2.63 FH/5.4TG SOPN, Inject 0.75 mg into the skin once a week., Disp: 4 pen, Rfl: 3 .  ferrous sulfate 325 (65 FE) MG tablet, Take 1 tablet (325 mg total) by mouth 2 (two) times daily with a meal., Disp: 180 tablet, Rfl: 3 .  fluticasone (FLONASE) 50 MCG/ACT nasal spray, Place 2 sprays into both nostrils daily as needed for allergies or rhinitis., Disp: 48 g, Rfl: 1 .  Fluticasone-Salmeterol (ADVAIR DISKUS) 250-50 MCG/DOSE AEPB, Inhale 1 puff into the lungs 2 (two) times daily., Disp: 180 each, Rfl: 3 .  glucose blood (ACCU-CHEK AVIVA) test strip, Use as instructed daily, Disp: 100 each, Rfl: 12 .  glucose blood (TRUE METRIX BLOOD GLUCOSE TEST) test strip, Use as instructed, Disp: 100 each, Rfl: 12 .  hydrALAZINE (APRESOLINE) 50 MG tablet, Take 1 tablet (50 mg total) by mouth every 8 (eight) hours., Disp: 90 tablet, Rfl: 0 .  hydrOXYzine (ATARAX/VISTARIL) 50 MG tablet, Take 1 tablet (50 mg total) by mouth 3 (three) times daily as needed for itching., Disp: 90 tablet, Rfl: 3 .  insulin aspart (NOVOLOG) 100 UNIT/ML FlexPen, Inject 10 Units into the skin 3 (three) times daily with meals., Disp: 15 mL, Rfl: 3 .  Insulin Glargine (LANTUS SOLOSTAR) 100 UNIT/ML Solostar Pen, Inject 48 Units into the skin 2 (  two) times daily., Disp: 24 mL, Rfl: 2 .  isosorbide mononitrate (IMDUR) 30 MG 24 hr tablet, Take 3 tablets (90 mg total) by mouth daily., Disp: 90 tablet, Rfl: 0 .  ketotifen (ZADITOR) 0.025 % ophthalmic solution, Place 1 drop into both eyes daily as needed (for dry eyes)., Disp: 10 mL, Rfl: 2 .  Lancet Devices (ACCU-CHEK SOFTCLIX) lancets, Use as instructed daily., Disp: 1 each, Rfl: 5 .  loratadine (CLARITIN) 10 MG tablet, Take 1 tablet (10 mg total) by mouth daily., Disp: 90 tablet, Rfl: 1 .  metolazone (ZAROXOLYN) 2.5 MG tablet, Take  1 tablet (2.5 mg total) by mouth daily as needed (swelling)., Disp: 30 tablet, Rfl: 0 .  MITIGARE 0.6 MG CAPS, Take 1 capsule by mouth daily., Disp: 90 capsule, Rfl: 1 .  montelukast (SINGULAIR) 10 MG tablet, Take 1 tablet (10 mg total) by mouth at bedtime., Disp: 90 tablet, Rfl: 1 .  nitroGLYCERIN (NITROSTAT) 0.4 MG SL tablet, Place 1 tablet (0.4 mg total) under the tongue every 5 (five) minutes x 3 doses as needed for chest pain., Disp: 25 tablet, Rfl: 12 .  omeprazole (PRILOSEC) 20 MG capsule, Take 1 capsule (20 mg total) by mouth daily., Disp: 90 capsule, Rfl: 3 .  oxyCODONE-acetaminophen (PERCOCET/ROXICET) 5-325 MG tablet, Take 1 tablet by mouth every 6 (six) hours as needed for severe pain., Disp: 20 tablet, Rfl: 0 .  potassium chloride SA (K-DUR,KLOR-CON) 20 MEQ tablet, Take 1 tablet (20 mEq total) by mouth 2 (two) times daily., Disp: 60 tablet, Rfl: 0 .  pregabalin (LYRICA) 100 MG capsule, Take 1 capsule (100 mg total) by mouth 3 (three) times daily., Disp: 270 capsule, Rfl: 1 .  ticagrelor (BRILINTA) 90 MG TABS tablet, Take 1 tablet (90 mg total) by mouth 2 (two) times daily., Disp: 180 tablet, Rfl: 3 .  torsemide (DEMADEX) 20 MG tablet, Take 2 tablets (40 mg total) by mouth 2 (two) times daily., Disp: 120 tablet, Rfl: 0 .  valACYclovir (VALTREX) 500 MG tablet, Take 1 tablet (500 mg total) by mouth daily., Disp: 90 tablet, Rfl: 3 .  Vitamin D, Ergocalciferol, (DRISDOL) 50000 units CAPS capsule, Take 1 capsule (50,000 Units total) by mouth every 30 (thirty) days., Disp: 4 capsule, Rfl: 1  Review of Systems  Constitutional: Negative.   HENT: Negative.   Respiratory: Negative.   Cardiovascular: Negative.   Gastrointestinal: Negative.   Neurological: Negative.     Social History   Tobacco Use  . Smoking status: Former Smoker    Last attempt to quit: 10/25/1980    Years since quitting: 36.9  . Smokeless tobacco: Never Used  . Tobacco comment: quit smoking in 1982  Substance Use  Topics  . Alcohol use: Yes    Comment: 3 times in last year   Objective:   BP (!) 160/80 (BP Location: Left Wrist, Patient Position: Sitting, Cuff Size: Normal)   Pulse 94   Temp 97.9 F (36.6 C) (Oral)   Resp 16   Ht '5\' 8"'$  (1.727 m)   Wt 296 lb 6.4 oz (134.4 kg)   SpO2 96%   BMI 45.07 kg/m    Physical Exam  Constitutional: She appears well-developed and well-nourished. No distress.  HENT:  Head: Normocephalic and atraumatic.  Right Ear: Hearing, tympanic membrane, external ear and ear canal normal.  Left Ear: Hearing, tympanic membrane, external ear and ear canal normal.  Nose: Nose normal.  Mouth/Throat: Uvula is midline, oropharynx is clear and moist and mucous membranes are normal.  No oropharyngeal exudate.  Eyes: Pupils are equal, round, and reactive to light. Conjunctivae are normal. Right eye exhibits no discharge. Left eye exhibits no discharge. No scleral icterus.  Neck: Normal range of motion. Neck supple. No tracheal deviation present. No thyromegaly present.  Cardiovascular: Normal rate and regular rhythm. Exam reveals no gallop and no friction rub.  Murmur heard. Pulmonary/Chest: Effort normal and breath sounds normal. No stridor. No respiratory distress. She has no wheezes. She has no rales.  Musculoskeletal: She exhibits edema (1-2+ pitting edema).  Lymphadenopathy:    She has no cervical adenopathy.  Skin: Skin is warm and dry. She is not diaphoretic.  Vitals reviewed.      Assessment & Plan:     1. 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  - allopurinol (ZYLOPRIM) 300 MG tablet; Take 1 tablet (300 mg total) by mouth daily.  Dispense: 90 tablet; Refill: 1 - MITIGARE 0.6 MG CAPS; Take 1 capsule  by mouth daily as needed.  Dispense: 90 capsule; Refill: 1  2. Essential hypertension Elevated today in the office. No blood pressure cuff at home to check readings. Will give prescription to see if insurance will cover a blood pressure cuff for her to have at home and check readings. She is to continue Imdur '30mg'$  TID (90 mg total daily), Hydralazine '50mg'$  TID, Carvedilol '25mg'$  BID. I will see her back in 4 weeks.  - Blood Pressure Monitoring (ADULT BLOOD PRESSURE CUFF LG) KIT; Check blood pressure once daily  Dispense: 1 each; Refill: 0  3. Chronic combined systolic and diastolic CHF (congestive heart failure) (HCC) Stable today. Getting labs drawn by Dr. Juleen China, Nephrology, today and also has f/u with Darylene Price, NP, [facility]. She is to continue Carvedilol '25mg'$  BID, Torsemide '20mg'$  ('40mg'$  BID). She also has Metolazone 2.'5mg'$  for prn use for any weight gain over 2 pounds in a day.  - carvedilol (COREG) 25 MG tablet; TAKE 1 TABLET BY MOUTH 2 TIMES DAILY WITH A MEAL.  Dispense: 180 tablet; Refill: 3  4. Diabetes mellitus type 2, uncontrolled, with complications (HCC) Stable neuropathy controlled with Lyrica as below. Continue Lantus 48 units BID, Novolog 10 units TID with meals, and Trulicity 0.'75mg'$  once weekly. I will see her back in 4 weeks to recheck her diabetes.  - pregabalin (LYRICA) 100 MG capsule; Take 1 capsule (100 mg total) by mouth 3 (three) times daily.  Dispense: 270 capsule; Refill: 1  5. CKD (chronic kidney disease) stage 4, GFR 15-29 ml/min (HCC) Currently stable. Having labs drawn with Dr. Juleen China today.   6. Herpes simplex infection of genitourinary system Stable. Diagnosis pulled for medication refill. Continue current medical treatment plan. - valACYclovir (VALTREX) 500 MG tablet; Take 1 tablet (500 mg total) by mouth daily.  Dispense: 90 tablet; Refill: 3  7. Vitamin D deficiency Stable. Diagnosis pulled for medication refill. Continue current medical treatment  plan. - Vitamin D, Ergocalciferol, (DRISDOL) 50000 units CAPS capsule; Take 1 capsule (50,000 Units total) by mouth every 30 (thirty) days.  Dispense: 4 capsule; Refill: 1  8. Coronary artery disease involving native coronary artery of native heart without angina pectoris Stable. Diagnosis pulled for medication refill. Continue current medical treatment plan. - ticagrelor (BRILINTA) 90 MG TABS tablet;  Take 1 tablet (90 mg total) by mouth 2 (two) times daily.  Dispense: 180 tablet; Refill: 3  9. Neuropathy Stable. Diagnosis pulled for medication refill. Continue current medical treatment plan. - pregabalin (LYRICA) 100 MG capsule; Take 1 capsule (100 mg total) by mouth 3 (three) times daily.  Dispense: 270 capsule; Refill: 1  10. Anemia in stage 3 chronic kidney disease (HCC) Stable. Diagnosis pulled for medication refill. Continue current medical treatment plan. - ferrous sulfate 325 (65 FE) MG tablet; Take 1 tablet (325 mg total) by mouth 2 (two) times daily with a meal.  Dispense: 180 tablet; Refill: 3  11. Encounter for gynecological examination Needs local GYN. Referral placed for establishing care.  - Ambulatory referral to Gynecology       [name], PA-C  [facility]

## 2017-10-14 NOTE — Patient Instructions (Signed)
Living With Heart Failure  Heart failure is a long-term (chronic) condition in which the heart cannot pump enough blood through the body. When this happens, parts of the body do not get the blood and oxygen they need. There is no cure for heart failure at this time, so it is important for you to take good care of yourself and follow the treatment plan set by your health care provider. If you are living with heart failure, there are ways to help you manage the disease. Follow these instructions at home: Living with heart failure requires you to make changes in your life. Your health care team will teach you about the changes you need to make in order to relieve your symptoms and lower your risk of going to the hospital. Follow the treatment plan as set by your health care provider. Medicines Medicines are important in reducing your heart's workload, slowing the progression of heart failure, and improving your symptoms.  Take over-the-counter and prescription medicines only as told by your health care provider.  Do not stop taking your medicine unless your health care provider tells you to do that.  Do not skip any dose of your medicine.  Refill prescriptions before you run out of medicine. You need your medicines every day.  Eating and drinking  Eat heart-healthy foods. Talk with a dietitian to make an eating plan that is right for you. ? If directed by your health care provider: ? Limit salt (sodium). Lowering your sodium intake may reduce symptoms of heart failure. Ask a dietitian to recommend heart-healthy seasonings. ? Limit your fluid intake. Fluid restriction may reduce symptoms of heart failure. ? Use low-fat cooking methods instead of frying. Low-fat methods include roasting, grilling, broiling, baking, poaching, steaming, and stir-frying. ? Choose foods that contain no trans fat and are low in saturated fat and cholesterol. Healthy choices include fresh or frozen fruits and vegetables,  fish, lean meats, legumes, fat-free or low-fat dairy products, and whole-grain or high-fiber foods.  Limit alcohol intake to no more than 1 drink a day for nonpregnant women and 2 drinks a day for men. One drink equals 12 oz of beer, 5 oz of wine, or 1 oz of hard liquor. ? Drinking more than that is harmful to your heart. Tell your health care provider if you drink alcohol several times a week. ? Talk with your health care provider about whether any level of alcohol use is safe for you. Activity  Ask your health care provider about attending cardiac rehabilitation. These programs include aerobic physical activity, which provides many benefits for your heart.  If no cardiac rehabilitation program is available, ask your health care provider what aerobic exercises are safe for you to do. Lifestyle Make the lifestyle changes recommended by your health care provider. In general:  Lose weight if your health care provider tells you to do that. Weight loss may reduce symptoms of heart failure.  Do not use any products that contain nicotine or tobacco, such as cigarettes or e-cigarettes. If you need help quitting, ask your health care provider.  Do not use street (illegal) drugs.  Return to your normal activities as told by your health care provider. Ask your health care provider what activities are safe for you.  General instructions  Make sure you weigh yourself every day to track your weight. Rapid weight gain may indicate an increase in fluid in your body and may increase the workload of your heart. ? Weigh yourself every morning. Do   this after you urinate but before you eat breakfast. ? Wear the same type of clothing, without shoes, each time you weigh yourself. ? Weigh yourself on the same scale and in the same spot each time.  Living with chronic heart failure often leads to emotions such as fear, stress, anxiety, and depression. If you feel any of these emotions and need help coping,  contact your health care provider. Other ways to get help include: ? Talking to friends and family members about your condition. They can give you support and guidance. Explain your symptoms to them and, if comfortable, invite them to attend appointments or rehabilitation with you. ? Joining a support group for people with chronic heart failure. Talking with other people who have the same symptoms may give you new ways of coping with your disease and your emotions.  Stay up to date with your shots (vaccines). Staying current on pneumococcal and influenza vaccines is especially important in preventing germs from attacking your airways (respiratory infections).  Keep all follow-up visits as told by your health care provider. This is important. How to recognize changes in your condition You and your family members need to know what changes to watch for in your condition. Watch for the following changes and report them to your health care provider:  Sudden weight gain. Ask your health care provider what amount of weight gain to report.  Shortness of breath: ? Feeling short of breath while at rest, with no exercise or activity that required great effort. ? Feeling breathless with activity.  Swelling of your lower legs or ankles.  Difficulty sleeping: ? You wake up feeling short of breath. ? You have to use more pillows to raise your head in order to sleep.  Frequent, dry, hacking cough.  Loss of appetite.  Feeling more tired all the time.  Depression or feelings of sadness or hopelessness.  Bloating in the stomach.  Where to find more information  Local support groups. Ask your health care provider about groups near you.  The American Heart Association: www.heart.org Contact a health care provider if:  You have a rapid weight gain.  You have increasing shortness of breath that is unusual for you.  You are unable to participate in your usual physical activities.  You tire  easily.  You cough more than normal, especially with physical activity.  You have any swelling or more swelling in areas such as your hands, feet, ankles, or abdomen.  You feel like your heart is beating quickly (palpitations).  You become dizzy or light-headed when you stand up. Get help right away if:  You have difficulty breathing.  You notice or your family notices a change in your awareness, such as having trouble staying awake or having difficulty with concentration.  You have pain or discomfort in your chest.  You have an episode of fainting (syncope). Summary  There is no cure for heart failure, so it is important for you to take good care of yourself and follow the treatment plan set by your health care provider.  Medicines are important in reducing your heart's workload, slowing the progression of heart failure, and improving your symptoms.  Living with chronic heart failure often leads to emotions such as fear, stress, anxiety, and depression. If you are feeling any of these emotions and need help coping, contact your health care provider. This information is not intended to replace advice given to you by your health care provider. Make sure you discuss any questions you   have with your health care provider. Document Released: 11/11/2016 Document Revised: 11/11/2016 Document Reviewed: 11/11/2016 Elsevier Interactive Patient Education  2018 Elsevier Inc.  

## 2017-10-14 NOTE — Telephone Encounter (Signed)
I have tried to call pt of two separate occasions and left two VM requesting a CB. Pt has not returned either calls. FYI to PCP! -MM

## 2017-10-14 NOTE — Patient Instructions (Signed)
Continue weighing daily and call for an overnight weight gain of > 2 pounds or a weekly weight gain of >5 pounds. 

## 2017-10-15 ENCOUNTER — Ambulatory Visit: Payer: Medicaid Other | Admitting: Family

## 2017-10-18 ENCOUNTER — Telehealth: Payer: Self-pay

## 2017-10-18 ENCOUNTER — Other Ambulatory Visit: Payer: Self-pay | Admitting: Physician Assistant

## 2017-10-18 DIAGNOSIS — I5042 Chronic combined systolic (congestive) and diastolic (congestive) heart failure: Secondary | ICD-10-CM

## 2017-10-18 DIAGNOSIS — N184 Chronic kidney disease, stage 4 (severe): Secondary | ICD-10-CM

## 2017-10-18 DIAGNOSIS — E118 Type 2 diabetes mellitus with unspecified complications: Secondary | ICD-10-CM

## 2017-10-18 DIAGNOSIS — E1165 Type 2 diabetes mellitus with hyperglycemia: Secondary | ICD-10-CM

## 2017-10-18 DIAGNOSIS — IMO0002 Reserved for concepts with insufficient information to code with codable children: Secondary | ICD-10-CM

## 2017-10-18 DIAGNOSIS — J45909 Unspecified asthma, uncomplicated: Secondary | ICD-10-CM

## 2017-10-18 DIAGNOSIS — G4733 Obstructive sleep apnea (adult) (pediatric): Secondary | ICD-10-CM

## 2017-10-18 NOTE — Progress Notes (Signed)
Home health orders placed.

## 2017-10-18 NOTE — Telephone Encounter (Signed)
Faxed prescription to [facility] 336 [phone] for Tub Bench. sd

## 2017-10-20 ENCOUNTER — Encounter: Payer: Medicaid Other | Admitting: Obstetrics and Gynecology

## 2017-10-20 ENCOUNTER — Encounter: Payer: Self-pay | Admitting: Cardiology

## 2017-10-21 ENCOUNTER — Telehealth: Payer: Self-pay | Admitting: Physician Assistant

## 2017-10-21 DIAGNOSIS — B182 Chronic viral hepatitis C: Secondary | ICD-10-CM

## 2017-10-21 NOTE — Telephone Encounter (Signed)
Patient wants to know if we have faxed the papers to Social Security and are you going to prescribed medication for the Hepatitis C?

## 2017-10-21 NOTE — Telephone Encounter (Signed)
Yes those letters were faxed and mailed.  I do not prescribe Hep C treatment. She will need to see GI for this.

## 2017-10-22 NOTE — Telephone Encounter (Signed)
LMTCB  Thanks,  -Manal Kreutzer 

## 2017-10-25 ENCOUNTER — Telehealth: Payer: Self-pay | Admitting: Physician Assistant

## 2017-10-25 NOTE — Telephone Encounter (Signed)
Please review. Thanks!  

## 2017-10-25 NOTE — Telephone Encounter (Signed)
Patient advised as directed below.She does want the referral to GI  Thanks,  -Adalida Garver

## 2017-10-25 NOTE — Telephone Encounter (Signed)
[name] with [facility] wanted to let us know that the tub bench that was ordered for pt was refused by pt due to it wouldn't fit in her shower/tub. Please advise. Thanks TNP CB# [phone]

## 2017-10-25 NOTE — Telephone Encounter (Signed)
Referral placed.

## 2017-10-25 NOTE — Progress Notes (Signed)
[facility] Consultation      Assessment and Plan:  Excessive daytime sleepiness.  -Remote history of obstructive sleep apnea, did not tolerate CPAP and stopped using more than 15 years ago. - Now recurrent symptoms of excessive daytime sleepiness, with poor sleep hygiene habits, patient goes to bed usually around 1:59 AM, wakes up around noon to 1 PM.  She wakes up with continued symptoms of excessive daytime sleepiness, which continued throughout the day. -We will send for sleep study, start CPAP as indicated.  Chronic diastolic congestive heart failure. - Likely contributing to dyspnea, continue follow-up with heart failure clinic.  Morbid obesity.  -Likely contributing to dyspnea, weight loss recommended.  Dyspnea on exertion. -Likely multifactorial secondary to above, continue management of underlying conditions, patient also has some degree of debility/deconditioning suspected.   Orders Placed This Encounter  Procedures  . Split night study   Return in about 3 months (around 01/25/2018).     Date: 10/26/2017  MRN# [id number] [name] [birth year]  Referring Physician: PCP.  [name] is a 62 y.o. old female seen in consultation for chief complaint of:    Chief Complaint  Patient presents with  . Consult    pt here after recent hospitalization, she denies all pulmonary sx sob,cough chest tighness  . Wheezing    occasional    HPI:   [name] is a 62 y/o female with a history of asthma, CAD, DM, HTN, CKD, hepatitis C, pancreatitis, obstructive sleep apnea, previous tobacco use and chronic heart failure.  Patient notes that she is wheezing, she has trouble breathing when laying flat, and nighttime congestion. She has a hsitory of CHF.  She snores at night, she was being set up for a sleep study, but has not been able to go because she gets admitted to the hospital because of heart failure issues.   She feels that her breathing is ok, but she  has trouble with laying down, but she can sleep with 2 pillows. She does not wear oxygen at night. She has no pets at home, no currently diagnosis of asthma or other respiratory issues. She had childhood asthma and used primatine. She last smoked in 1982, she smoked less than half ppd.  She is sleepy during the day, she stays up until 2 or 3 am, she falls asleep in bed watching television. She wakes at 1 pm. She wakes feeling tired and exhausted. She had a sleep study about 15 yrs ago, she was told that she had OSA, started on CPAP but did not like it and never really used it.   She lives at home with her daughter, she does all her own ADL's and looks after grandkids of 2yo and 4 months, she also has a niece who comes in and helps look after the kids.   Desat walk 10/26/17; on ra at rest; sat 98% and HR 84. Walked 180 at slow pace with moderate dyspnea with sat of 95% and HR 98.    Imaging personally reviewed, chest x-[place] 10/03/17; cardiomegaly, in particular there is right heart enlargement, bibasilar atelectasis. Spirometry 09/28/17.  FVC is 1.84 L, 59%, FEV1 is 1.48 L, 60%, ratio is 80%, there was no bronchodilator given.  This test is overall consistent with restrictive lung disease though this appears to be in a suboptimal study.  Echo report from 09/15/17 reviewed and showed an EF of 65-70%, moderate AS, trivial AR and calcified mitral valve annulus. Cardiac catheterization done 04/29/17 which  showed a widely patient mid LAD stent and normal left ventricular filling pressure.   PMHX:   Past Medical History:  Diagnosis Date  . Aortic stenosis    Echo 8/18: mean 13, peak 28, LVOT/AV mean velocity 0.51  . Asthma    As a child   . Bronchitis   . CAD (coronary artery disease)    a. 09/2016: 50% Ost 1st Mrg stenosis, 50% 2nd Mrg stenosis, 20% Mid-Cx, 95% Prox LAD, 40% mid-LAD, and 10% dist-LAD stenosis. Staged PCI with DES to Prox-LAD.   [name] Chronic combined systolic and diastolic CHF (congestive  heart failure) ([place]) 2011   echo 2/18: EF 55-60, normal wall motion, grade 2 diastolic dysfunction, trivial AI // echo 3/18: Septal and apical HK, EF 45-50, normal wall motion, trivial AI, mild LAE, PASP 38 // echo 8/18: EF 60-65, normal wall motion, grade 1 diastolic dysfunction, calcified aortic valve leaflets, mild aortic stenosis (mean 13, peak 28, LVOT/AV mean velocity 0.51), mild AI, moderate MAC, mild LAE, trivial TR   . Chronic kidney disease   . Complication of anesthesia   . Diabetes mellitus Dx 1989  . Hepatitis C Dx 2013  . Hypertension Dx 1989  . Obesity   . Obstructive sleep apnea   . Pancreatitis 2013  . Refusal of blood transfusions as patient is Jehovah's Witness   . Tendinitis   . Ulcer 2010   Surgical Hx:  Past Surgical History:  Procedure Laterality Date  . APPLICATION OF WOUND VAC Left 08/14/2017   Procedure: APPLICATION OF WOUND VAC Exchange;  Surgeon: Robert Bellow, MD;  Location: [facility];  Service: General;  Laterality: Left;  . CHOLECYSTECTOMY    . CORONARY STENT INTERVENTION N/A 09/18/2016   Procedure: Coronary Stent Intervention;  Surgeon: Troy Sine, MD;  Location: [facility];  Service: Cardiovascular;  Laterality: N/A;  . DRESSING CHANGE UNDER ANESTHESIA Left 08/15/2017   Procedure: exploration of wound for bleeding;  Surgeon: Robert Bellow, MD;  Location: [facility];  Service: General;  Laterality: Left;  . EYE SURGERY    . INCISION AND DRAINAGE ABSCESS Left 08/12/2017   Procedure: INCISION AND DRAINAGE ABSCESS;  Surgeon: Robert Bellow, MD;  Location: [facility];  Service: General;  Laterality: Left;  . KNEE ARTHROSCOPY    . LEFT HEART CATH N/A 09/18/2016   Procedure: Left Heart Cath;  Surgeon: Troy Sine, MD;  Location: [facility];  Service: Cardiovascular;  Laterality: N/A;  . LEFT HEART CATH AND CORONARY ANGIOGRAPHY N/A 09/16/2016   Procedure: Left Heart Cath and Coronary Angiography;  Surgeon: Burnell Blanks, MD;   Location: [facility];  Service: Cardiovascular;  Laterality: N/A;  . LEFT HEART CATH AND CORONARY ANGIOGRAPHY N/A 04/29/2017   Procedure: LEFT HEART CATH AND CORONARY ANGIOGRAPHY;  Surgeon: Nelva Bush, MD;  Location: [facility];  Service: Cardiovascular;  Laterality: N/A;  . TUBAL LIGATION    . TUBAL LIGATION     Family Hx:  Family History  Problem Relation Age of Onset  . Colon cancer Mother   . Heart attack Other   . Heart attack Maternal Grandmother   . Hypertension Sister   . Hypertension Brother   . Diabetes Paternal Grandmother   . Breast cancer Neg Hx    Social Hx:   Social History   Tobacco Use  . Smoking status: Former Smoker    Last attempt to quit: 10/25/1980    Years since quitting: 37.0  .  Smokeless tobacco: Never Used  . Tobacco comment: quit smoking in 1982  Substance Use Topics  . Alcohol use: Yes    Comment: 3 times in last year  . Drug use: No    Types: "Crack" cocaine    Comment: 08/21/2016 "clean since 05/1998"   Medication:    Current Outpatient Medications:  .  albuterol (PROVENTIL) (2.5 MG/3ML) 0.083% nebulizer solution, Take 3 mLs (2.5 mg total) by nebulization every 6 (six) hours as needed for wheezing or shortness of breath., Disp: 150 mL, Rfl: 1 .  albuterol (VENTOLIN HFA) 108 (90 Base) MCG/ACT inhaler, Inhale 2 puffs into the lungs every 4 (four) hours as needed for wheezing or shortness of breath., Disp: 54 g, Rfl: 3 .  allopurinol (ZYLOPRIM) 300 MG tablet, Take 1 tablet (300 mg total) by mouth daily., Disp: 90 tablet, Rfl: 1 .  aspirin EC 81 MG EC tablet, Take 1 tablet (81 mg total) by mouth daily., Disp: , Rfl:  .  atorvastatin (LIPITOR) 80 MG tablet, Take 1 tablet (80 mg total) by mouth daily at 6 PM., Disp: 90 tablet, Rfl: 3 .  Blood Glucose Monitoring Suppl (ACCU-CHEK AVIVA) device, Use as instructed daily., Disp: 1 each, Rfl: 0 .  Blood Pressure Monitoring (ADULT BLOOD PRESSURE CUFF LG) KIT, Check blood pressure once  daily, Disp: 1 each, Rfl: 0 .  carvedilol (COREG) 25 MG tablet, TAKE 1 TABLET BY MOUTH 2 TIMES DAILY WITH A MEAL., Disp: 180 tablet, Rfl: 3 .  docusate sodium (COLACE) 100 MG capsule, Take 1 capsule (100 mg total) by mouth 2 (two) times daily as needed for mild constipation., Disp: 30 capsule, Rfl: 0 .  Dulaglutide (TRULICITY) 8.81 JS/3.1RX SOPN, Inject 0.75 mg into the skin once a week., Disp: 4 pen, Rfl: 3 .  ferrous sulfate 325 (65 FE) MG tablet, Take 1 tablet (325 mg total) by mouth 2 (two) times daily with a meal., Disp: 180 tablet, Rfl: 3 .  fluticasone (FLONASE) 50 MCG/ACT nasal spray, Place 2 sprays into both nostrils daily as needed for allergies or rhinitis., Disp: 48 g, Rfl: 1 .  Fluticasone-Salmeterol (ADVAIR DISKUS) 250-50 MCG/DOSE AEPB, Inhale 1 puff into the lungs 2 (two) times daily., Disp: 180 each, Rfl: 3 .  glucose blood (ACCU-CHEK AVIVA) test strip, Use as instructed daily, Disp: 100 each, Rfl: 12 .  glucose blood (TRUE METRIX BLOOD GLUCOSE TEST) test strip, Use as instructed, Disp: 100 each, Rfl: 12 .  hydrALAZINE (APRESOLINE) 50 MG tablet, Take 1 tablet (50 mg total) by mouth every 8 (eight) hours., Disp: 90 tablet, Rfl: 0 .  hydrOXYzine (ATARAX/VISTARIL) 50 MG tablet, Take 1 tablet (50 mg total) by mouth 3 (three) times daily as needed for itching., Disp: 90 tablet, Rfl: 3 .  insulin aspart (NOVOLOG) 100 UNIT/ML FlexPen, Inject 10 Units into the skin 3 (three) times daily with meals., Disp: 15 mL, Rfl: 3 .  Insulin Glargine (LANTUS SOLOSTAR) 100 UNIT/ML Solostar Pen, Inject 48 Units into the skin 2 (two) times daily., Disp: 24 mL, Rfl: 2 .  isosorbide mononitrate (IMDUR) 30 MG 24 hr tablet, Take 3 tablets (90 mg total) by mouth daily., Disp: 90 tablet, Rfl: 0 .  ketotifen (ZADITOR) 0.025 % ophthalmic solution, Place 1 drop into both eyes daily as needed (for dry eyes)., Disp: 10 mL, Rfl: 2 .  Lancet Devices (ACCU-CHEK SOFTCLIX) lancets, Use as instructed daily., Disp: 1 each,  Rfl: 5 .  loratadine (CLARITIN) 10 MG tablet, Take 1 tablet (10 mg  total) by mouth daily., Disp: 90 tablet, Rfl: 1 .  metolazone (ZAROXOLYN) 2.5 MG tablet, Take 1 tablet (2.5 mg total) by mouth daily as needed (swelling)., Disp: 30 tablet, Rfl: 0 .  MITIGARE 0.6 MG CAPS, Take 1 capsule by mouth daily as needed., Disp: 90 capsule, Rfl: 1 .  montelukast (SINGULAIR) 10 MG tablet, Take 1 tablet (10 mg total) by mouth at bedtime., Disp: 90 tablet, Rfl: 1 .  nitroGLYCERIN (NITROSTAT) 0.4 MG SL tablet, Place 1 tablet (0.4 mg total) under the tongue every 5 (five) minutes x 3 doses as needed for chest pain., Disp: 25 tablet, Rfl: 12 .  omeprazole (PRILOSEC) 20 MG capsule, Take 1 capsule (20 mg total) by mouth daily., Disp: 90 capsule, Rfl: 3 .  potassium chloride SA (K-DUR,KLOR-CON) 20 MEQ tablet, Take 1 tablet (20 mEq total) by mouth 2 (two) times daily., Disp: 60 tablet, Rfl: 0 .  pregabalin (LYRICA) 100 MG capsule, Take 1 capsule (100 mg total) by mouth 3 (three) times daily., Disp: 270 capsule, Rfl: 1 .  ticagrelor (BRILINTA) 90 MG TABS tablet, Take 1 tablet (90 mg total) by mouth 2 (two) times daily., Disp: 180 tablet, Rfl: 3 .  torsemide (DEMADEX) 20 MG tablet, Take 2 tablets (40 mg total) by mouth 2 (two) times daily., Disp: 120 tablet, Rfl: 0 .  valACYclovir (VALTREX) 500 MG tablet, Take 1 tablet (500 mg total) by mouth daily., Disp: 90 tablet, Rfl: 3 .  Vitamin D, Ergocalciferol, (DRISDOL) 50000 units CAPS capsule, Take 1 capsule (50,000 Units total) by mouth every 30 (thirty) days., Disp: 4 capsule, Rfl: 1   Allergies:  Shellfish allergy; Diazepam; and Morphine and related  Review of Systems: Gen:  Denies  fever, sweats, chills HEENT: Denies blurred vision, double vision. bleeds, sore throat Cvc:  No dizziness, chest pain. Resp:   Denies cough or sputum production, shortness of breath Gi: Denies swallowing difficulty, stomach pain. Gu:  Denies bladder incontinence, burning urine Ext:   No  Joint pain, stiffness. Skin: No skin rash,  hives  Endoc:  No polyuria, polydipsia. Psych: No depression, insomnia. Other:  All other systems were reviewed with the patient and were negative other that what is mentioned in the HPI.   Physical Examination:   VS: BP 140/80 (BP Location: Left Arm, Cuff Size: Large)   Pulse 91   Resp 16   Ht 5' 8"  (1.727 m)   Wt 299 lb (135.6 kg)   SpO2 99%   BMI 45.46 kg/m   General Appearance: No distress  Neuro:without focal findings,  speech normal,  HEENT: PERRLA, EOM intact.   Pulmonary: normal breath sounds, No wheezing.  CardiovascularNormal S1,S2.  No m/r/g.   Abdomen: Benign, Soft, non-tender. Renal:  No costovertebral tenderness  GU:  No performed at this time. Endoc: No evident thyromegaly, no signs of acromegaly. Skin:   warm, no rashes, no ecchymosis  Extremities: normal, no cyanosis, clubbing.  Other findings:    LABORATORY PANEL:   CBC No results for input(s): WBC, HGB, HCT, PLT in the last 168 hours. ------------------------------------------------------------------------------------------------------------------  Chemistries  No results for input(s): NA, K, CL, CO2, GLUCOSE, BUN, CREATININE, CALCIUM, MG, AST, ALT, ALKPHOS, BILITOT in the last 168 hours.  Invalid input(s): GFRCGP ------------------------------------------------------------------------------------------------------------------  Cardiac Enzymes No results for input(s): TROPONINI in the last 168 hours. ------------------------------------------------------------  RADIOLOGY:  No results found.     Thank  you for the consultation and for allowing [facility] to assist in the care  of your patient. Our recommendations are noted above.  Please contact us if we can be of further service.   Marda Stalker, MD.  Board Certified in Internal Medicine, Pulmonary Medicine, [place], and Sleep Medicine.  [facility] Office Number: [phone]  Patricia Pesa, M.D.  Merton Border, M.D  10/26/2017

## 2017-10-25 NOTE — Telephone Encounter (Signed)
OK 

## 2017-10-26 ENCOUNTER — Ambulatory Visit (INDEPENDENT_AMBULATORY_CARE_PROVIDER_SITE_OTHER): Payer: Medicaid Other | Admitting: Internal Medicine

## 2017-10-26 ENCOUNTER — Ambulatory Visit: Payer: Medicaid Other | Admitting: Cardiology

## 2017-10-26 ENCOUNTER — Encounter: Payer: Self-pay | Admitting: Internal Medicine

## 2017-10-26 VITALS — BP 140/80 | HR 91 | Resp 16 | Ht 68.0 in | Wt 299.0 lb

## 2017-10-26 DIAGNOSIS — G4719 Other hypersomnia: Secondary | ICD-10-CM

## 2017-10-26 NOTE — Progress Notes (Deleted)
Cardiology Office Note:    Date:  10/26/2017   ID:  [name], DOB 1955/11/18, MRN [id number]  PCP:  [name], PA-C  Cardiologist:  Sherren Mocha, MD  Referring MD: Florian Buff*   No chief complaint on file. ***  History of Present Illness:    [name] is a 62 y.o. female with a past medical history significant for diabetes type 2, hypertension, hyperlipidemia, CKD stage IV, hep C, OSA, CAD, chronic diastolic CHF and mild AS.  The patient has a history of a non-ST elevation MI in 09/2016 and found to have 95% proximal LAD stenosis.  She received a Synergy drug-eluting stent.  Echocardiogram at that time showed EF 45-50% with septal and apical hypokinesis.  A follow-up echocardiogram in 02/2017 showed normal LV function, grade 1 diastolic dysfunction, mild aortic stenosis with mean gradient 13 mmHg, mild aortic insufficiency and mild left atrial enlargement.  She had repeat catheterization in October 2018 because of recurrent chest pain and was found to have a widely patent mid LAD stent.  There was mild to moderate disease in her circumflex, use marginal and diagonal.  LV filling pressures were normal and medical therapy was recommended.  She is treated with Brilinta, aspirin 81 mg, statin, carvedilol, Imdur  She was seen in the emergency department in January for shortness of breath and weight.  She was diuresed with IV Lasix and discharged on an increased dose of Lasix. Weight was 323 pounds she subsequently lost 21 pounds by her follow-up appointment on 08/05/17.  She was recently admitted to the hospital 10/03/17-10/11/17 for anasarca, shortness of breath and hypoxia.  She was treated for acute on chronic diastolic CHF secondary to noncompliance.  She was diuresed with IV Lasix drip converted to oral torsemide with as needed metolazone.  Weight came down from approximately 330 pounds to 291 pounds with much improvement in breathing.  She was seen by nephrology  while in the hospital with serum creatinine around 2.5.   -----------------------------------------------------------------------  Acute on chronic diastolic CHF: Recently admitted to the hospital for volume overload.  Diuresed with IV Lasix, now on torsemide 40 mg twice daily and metolazone 2.5 mg daily as needed swelling  Hypertension: Hydralazine 50 mg 3 times daily, Imdur 30 mg and diuretics (trying to avoid amlodipine)  CKD stage III: Followed by Dr. Arty Baumgartner, [facility].  Baseline creatinine about 1.81/GFR 35.  Serum creatinine up to 2.7 with diuresis.  Arctic stenosis:  Hyperlipidemia: Atorvastatin 80 mg.  LDL 52 on 09/13/16, at goal less than 100  Diabetes type 2: On insulin  Obesity  OSA: Patient reported that she has sleep apnea but cannot afford CPAP.  Past Medical History:  Diagnosis Date  . Aortic stenosis    Echo 8/18: mean 13, peak 28, LVOT/AV mean velocity 0.51  . Asthma    As a child   . Bronchitis   . CAD (coronary artery disease)    a. 09/2016: 50% Ost 1st Mrg stenosis, 50% 2nd Mrg stenosis, 20% Mid-Cx, 95% Prox LAD, 40% mid-LAD, and 10% dist-LAD stenosis. Staged PCI with DES to Prox-LAD.   [name] Chronic combined systolic and diastolic CHF (congestive heart failure) ([place]) 2011   echo 2/18: EF 55-60, normal wall motion, grade 2 diastolic dysfunction, trivial AI // echo 3/18: Septal and apical HK, EF 45-50, normal wall motion, trivial AI, mild LAE, PASP 38 // echo 8/18: EF 60-65, normal wall motion, grade 1 diastolic dysfunction, calcified aortic valve leaflets, mild  aortic stenosis (mean 13, peak 28, LVOT/AV mean velocity 0.51), mild AI, moderate MAC, mild LAE, trivial TR   . Chronic kidney disease   . Complication of anesthesia   . Diabetes mellitus Dx 1989  . Hepatitis C Dx 2013  . Hypertension Dx 1989  . Obesity   . Obstructive sleep apnea   . Pancreatitis 2013  . Refusal of blood transfusions as patient is Jehovah's Witness   . Tendinitis   . Ulcer  2010    Past Surgical History:  Procedure Laterality Date  . APPLICATION OF WOUND VAC Left 08/14/2017   Procedure: APPLICATION OF WOUND VAC Exchange;  Surgeon: Robert Bellow, MD;  Location: [facility];  Service: General;  Laterality: Left;  . CHOLECYSTECTOMY    . CORONARY STENT INTERVENTION N/A 09/18/2016   Procedure: Coronary Stent Intervention;  Surgeon: Troy Sine, MD;  Location: [place] [facility];  Service: Cardiovascular;  Laterality: N/A;  . DRESSING CHANGE UNDER ANESTHESIA Left 08/15/2017   Procedure: exploration of wound for bleeding;  Surgeon: Robert Bellow, MD;  Location: [facility];  Service: General;  Laterality: Left;  . EYE SURGERY    . INCISION AND DRAINAGE ABSCESS Left 08/12/2017   Procedure: INCISION AND DRAINAGE ABSCESS;  Surgeon: Robert Bellow, MD;  Location: [facility];  Service: General;  Laterality: Left;  . KNEE ARTHROSCOPY    . LEFT HEART CATH N/A 09/18/2016   Procedure: Left Heart Cath;  Surgeon: Troy Sine, MD;  Location: [place] [facility];  Service: Cardiovascular;  Laterality: N/A;  . LEFT HEART CATH AND CORONARY ANGIOGRAPHY N/A 09/16/2016   Procedure: Left Heart Cath and Coronary Angiography;  Surgeon: Burnell Blanks, MD;  Location: [place] [facility];  Service: Cardiovascular;  Laterality: N/A;  . LEFT HEART CATH AND CORONARY ANGIOGRAPHY N/A 04/29/2017   Procedure: LEFT HEART CATH AND CORONARY ANGIOGRAPHY;  Surgeon: Nelva Bush, MD;  Location: [place] [facility];  Service: Cardiovascular;  Laterality: N/A;  . TUBAL LIGATION    . TUBAL LIGATION      Current Medications: No outpatient medications have been marked as taking for the 10/26/17 encounter (Appointment) with Daune Perch, NP.     Allergies:   Shellfish allergy; Diazepam; and Morphine and related   Social History   Socioeconomic History  . Marital status: Divorced    Spouse name: Not on file  . Number of children: Not on file  . Years of education: Not on file  .  Highest education level: Not on file  Occupational History  . Not on file  Social Needs  . Financial resource strain: Not on file  . Food insecurity:    Worry: Not on file    Inability: Not on file  . Transportation needs:    Medical: Not on file    Non-medical: Not on file  Tobacco Use  . Smoking status: Former Smoker    Last attempt to quit: 10/25/1980    Years since quitting: 37.0  . Smokeless tobacco: Never Used  . Tobacco comment: quit smoking in 1982  Substance and Sexual Activity  . Alcohol use: Yes    Comment: 3 times in last year  . Drug use: No    Types: "Crack" cocaine    Comment: 08/21/2016 "clean since 05/1998"  . Sexual activity: Not on file    Comment: Not asked  Lifestyle  . Physical activity:    Days per week: Not on file    Minutes per session: Not  on file  . Stress: Not on file  Relationships  . Social connections:    Talks on phone: Not on file    Gets together: Not on file    Attends religious service: Not on file    Active member of club or organization: Not on file    Attends meetings of clubs or organizations: Not on file    Relationship status: Not on file  Other Topics Concern  . Not on file  Social History Narrative  . Not on file     Family History: The patient's ***family history includes Colon cancer in her mother; Diabetes in her paternal grandmother; Heart attack in her maternal grandmother and other; Hypertension in her brother and sister. There is no history of Breast cancer. ROS:   Please see the history of present illness.    *** All other systems reviewed and are negative.  EKGs/Labs/Other Studies Reviewed:    The following studies were reviewed today:  EchoCardiogram 09/15/17 Study Conclusions - Left ventricle: The cavity size was at the upper limits of   normal. Wall thickness was increased in a pattern of mild LVH.   Systolic function was vigorous. The estimated ejection fraction   was in the range of 65% to 70%. Wall  motion was normal; there   were no regional wall motion abnormalities. Doppler parameters   are consistent with abnormal left ventricular relaxation (grade 1   diastolic dysfunction). Doppler parameters are consistent with   high ventricular filling pressure. - Aortic valve: Transvalvular velocity was increased. There was   moderate stenosis. There was trivial regurgitation. Peak velocity   (S): 308 cm/s. Mean gradient (S): 20 mm Hg. Valve area (VTI):   1.34 cm^2. Valve area (Vmax): 1.11 cm^2. - Ascending aorta: The ascending aorta was mildly dilated. - Mitral valve: Calcified annulus. Mildly thickened leaflets . - Left atrium: The atrium was mildly dilated. - Right ventricle: The cavity size was normal. Systolic function   was normal. - Inferior vena cava: The vessel was dilated. The respirophasic   diameter changes were blunted (< 50%), consistent with elevated   central venous pressure. - Pericardium, extracardiac: A trivial pericardial effusion was   identified posterior to the heart.  LEFT HEART CATH AND CORONARY ANGIOGRAPHY  04/29/17   Conclusions: 1. Widely patent mid LAD stent. 2. No significant change in mild to moderate LCx, OM, and diagonal disease compared with 09/2016. 3. Normal left ventricular filling pressure. Recommendations: 1. Continue medical therapy and secondary prevention. I will restart isosorbide mononitrate at 90 mg daily with hope of weaning off NTG infusion later today. 2. Gentle hydration given CKD and normal LVEDP. Hold furosemide today and reassess volume status and renal function tomorrow before restarting diuretic therapy.      EKG:  EKG is *** ordered today.  The ekg ordered today demonstrates ***  Recent Labs: 08/05/2017: NT-Pro BNP 401 08/11/2017: TSH 1.444 10/03/2017: ALT 74; B Natriuretic Peptide 147.0 10/07/2017: Magnesium 1.7 10/09/2017: Hemoglobin 9.5; Platelets 257 10/11/2017: BUN 97; Creatinine, Ser 2.70; Potassium 3.6; Sodium 139    Recent Lipid Panel    Component Value Date/Time   CHOL 122 09/13/2016 1245   TRIG 168 (H) 09/13/2016 1245   HDL 36 (L) 09/13/2016 1245   CHOLHDL 3.4 09/13/2016 1245   VLDL 34 09/13/2016 1245   LDLCALC 52 09/13/2016 1245    Physical Exam:    VS:  There were no vitals taken for this visit.    Wt Readings from Last 3  Encounters:  10/14/17 296 lb 2 oz (134.3 kg)  10/14/17 296 lb 6.4 oz (134.4 kg)  10/11/17 291 lb 11.2 oz (132.3 kg)     Physical Exam***   ASSESSMENT:    No diagnosis found. PLAN:    In order of problems listed above:  1. ***   Medication Adjustments/Labs and Tests Ordered: Current medicines are reviewed at length with the patient today.  Concerns regarding medicines are outlined above. Labs and tests ordered and medication changes are outlined in the patient instructions below:  There are no Patient Instructions on file for this visit.   Signed, Daune Perch, NP  10/26/2017 6:36 AM    [place]

## 2017-10-26 NOTE — Patient Instructions (Addendum)
--

## 2017-10-28 ENCOUNTER — Other Ambulatory Visit: Payer: Self-pay | Admitting: Physician Assistant

## 2017-10-28 ENCOUNTER — Encounter: Payer: Medicaid Other | Admitting: Obstetrics and Gynecology

## 2017-10-28 ENCOUNTER — Encounter: Payer: Self-pay | Admitting: Obstetrics and Gynecology

## 2017-10-28 ENCOUNTER — Encounter: Payer: Self-pay | Admitting: Cardiology

## 2017-10-28 ENCOUNTER — Ambulatory Visit (INDEPENDENT_AMBULATORY_CARE_PROVIDER_SITE_OTHER): Payer: Medicaid Other | Admitting: Obstetrics and Gynecology

## 2017-10-28 VITALS — BP 144/98 | Ht 68.0 in | Wt 288.0 lb

## 2017-10-28 DIAGNOSIS — Z01419 Encounter for gynecological examination (general) (routine) without abnormal findings: Secondary | ICD-10-CM

## 2017-10-28 DIAGNOSIS — Z124 Encounter for screening for malignant neoplasm of cervix: Secondary | ICD-10-CM | POA: Diagnosis not present

## 2017-10-28 DIAGNOSIS — Z0000 Encounter for general adult medical examination without abnormal findings: Secondary | ICD-10-CM

## 2017-10-28 DIAGNOSIS — I10 Essential (primary) hypertension: Secondary | ICD-10-CM

## 2017-10-28 MED ORDER — ISOSORBIDE MONONITRATE ER 30 MG PO TB24: 90.0000 mg | ORAL_TABLET | Freq: Every day | ORAL | 1 refills | Status: DC

## 2017-10-28 NOTE — Telephone Encounter (Signed)
Patient needs refills on Isosorbide called to [facility] on [address].

## 2017-10-28 NOTE — Progress Notes (Signed)
Routine Annual Gynecology Examination   PCP: [name], PA-C  Chief Complaint  Patient presents with  . Referral  Referral from [name], [place], at [facility] for gynecologic exam.  History of Present Illness: Patient is a 62 y.o. [id number] presents for annual exam. The patient has no complaints today.   Menopausal bleeding: denies  Menopausal symptoms: denies  Breast symptoms: denies  Last pap smear: 2 years ago.  Result Normal  Last mammogram: 6 months go.  Result: BiRads 2  Feels like she is having a genital herpes outbreak.   Past Medical History:  Diagnosis Date  . Aortic stenosis    Echo 8/18: mean 13, peak 28, LVOT/AV mean velocity 0.51  . Asthma    As a child   . Bronchitis   . CAD (coronary artery disease)    a. 09/2016: 50% Ost 1st Mrg stenosis, 50% 2nd Mrg stenosis, 20% Mid-Cx, 95% Prox LAD, 40% mid-LAD, and 10% dist-LAD stenosis. Staged PCI with DES to Prox-LAD.   [name] Chronic combined systolic and diastolic CHF (congestive heart failure) ([place]) 2011   echo 2/18: EF 55-60, normal wall motion, grade 2 diastolic dysfunction, trivial AI // echo 3/18: Septal and apical HK, EF 45-50, normal wall motion, trivial AI, mild LAE, PASP 38 // echo 8/18: EF 60-65, normal wall motion, grade 1 diastolic dysfunction, calcified aortic valve leaflets, mild aortic stenosis (mean 13, peak 28, LVOT/AV mean velocity 0.51), mild AI, moderate MAC, mild LAE, trivial TR   . Chronic kidney disease   . Complication of anesthesia   . Diabetes mellitus Dx 1989  . Hepatitis C Dx 2013  . Hypertension Dx 1989  . Obesity   . Obstructive sleep apnea   . Pancreatitis 2013  . Refusal of blood transfusions as patient is Jehovah's Witness   . Tendinitis   . Ulcer 2010    Past Surgical History:  Procedure Laterality Date  . APPLICATION OF WOUND VAC Left 08/14/2017   Procedure: APPLICATION OF WOUND VAC Exchange;  Surgeon: Robert Bellow, MD;  Location: [facility];   Service: General;  Laterality: Left;  . CHOLECYSTECTOMY    . CORONARY STENT INTERVENTION N/A 09/18/2016   Procedure: Coronary Stent Intervention;  Surgeon: Troy Sine, MD;  Location: [place] [facility];  Service: Cardiovascular;  Laterality: N/A;  . DRESSING CHANGE UNDER ANESTHESIA Left 08/15/2017   Procedure: exploration of wound for bleeding;  Surgeon: Robert Bellow, MD;  Location: [facility];  Service: General;  Laterality: Left;  . EYE SURGERY    . INCISION AND DRAINAGE ABSCESS Left 08/12/2017   Procedure: INCISION AND DRAINAGE ABSCESS;  Surgeon: Robert Bellow, MD;  Location: [facility];  Service: General;  Laterality: Left;  . KNEE ARTHROSCOPY    . LEFT HEART CATH N/A 09/18/2016   Procedure: Left Heart Cath;  Surgeon: Troy Sine, MD;  Location: [place] [facility];  Service: Cardiovascular;  Laterality: N/A;  . LEFT HEART CATH AND CORONARY ANGIOGRAPHY N/A 09/16/2016   Procedure: Left Heart Cath and Coronary Angiography;  Surgeon: Burnell Blanks, MD;  Location: [place] [facility];  Service: Cardiovascular;  Laterality: N/A;  . LEFT HEART CATH AND CORONARY ANGIOGRAPHY N/A 04/29/2017   Procedure: LEFT HEART CATH AND CORONARY ANGIOGRAPHY;  Surgeon: Nelva Bush, MD;  Location: [place] [facility];  Service: Cardiovascular;  Laterality: N/A;  . TUBAL LIGATION    . TUBAL LIGATION      Medications   Medication Sig Start  Date End Date Taking? Authorizing Provider  albuterol (PROVENTIL) (2.5 MG/3ML) 0.083% nebulizer solution Take 3 mLs (2.5 mg total) by nebulization every 6 (six) hours as needed for wheezing or shortness of breath. 09/01/17   [name], PA-C  albuterol (VENTOLIN HFA) 108 (90 Base) MCG/ACT inhaler Inhale 2 puffs into the lungs every 4 (four) hours as needed for wheezing or shortness of breath. 09/01/17   [name], PA-C  allopurinol (ZYLOPRIM) 300 MG tablet Take 1 tablet (300 mg total) by mouth daily. 10/14/17   [name], PA-C    aspirin EC 81 MG EC tablet Take 1 tablet (81 mg total) by mouth daily. 09/19/16   Strader, Fransisco Hertz, PA-C  atorvastatin (LIPITOR) 80 MG tablet Take 1 tablet (80 mg total) by mouth daily at 6 PM. 09/24/17   Burnette, Clearnce Sorrel, PA-C  Blood Glucose Monitoring Suppl (ACCU-CHEK AVIVA) device Use as instructed daily. 04/14/17   Charlott Rakes, MD  Blood Pressure Monitoring (ADULT BLOOD PRESSURE CUFF LG) KIT Check blood pressure once daily 10/14/17   [name], PA-C  carvedilol (COREG) 25 MG tablet TAKE 1 TABLET BY MOUTH 2 TIMES DAILY WITH A MEAL. 10/14/17   [name], PA-C  docusate sodium (COLACE) 100 MG capsule Take 1 capsule (100 mg total) by mouth 2 (two) times daily as needed for mild constipation. 09/20/17   Gouru, Illene Silver, MD  Dulaglutide (TRULICITY) 3.14 HF/0.2OV SOPN Inject 0.75 mg into the skin once a week. 09/28/17   [name], PA-C  ferrous sulfate 325 (65 FE) MG tablet Take 1 tablet (325 mg total) by mouth 2 (two) times daily with a meal. 10/14/17   Burnette, Clearnce Sorrel, PA-C  fluticasone (FLONASE) 50 MCG/ACT nasal spray Place 2 sprays into both nostrils daily as needed for allergies or rhinitis. 09/28/17   [name], PA-C  Fluticasone-Salmeterol (ADVAIR DISKUS) 250-50 MCG/DOSE AEPB Inhale 1 puff into the lungs 2 (two) times daily. 09/01/17   [name], PA-C  glucose blood (ACCU-CHEK AVIVA) test strip Use as instructed daily 04/14/17   Charlott Rakes, MD  glucose blood (TRUE METRIX BLOOD GLUCOSE TEST) test strip Use as instructed 03/19/17   Barrett, Evelene Croon, PA-C  hydrALAZINE (APRESOLINE) 50 MG tablet Take 1 tablet (50 mg total) by mouth every 8 (eight) hours. 10/11/17   Salary, Avel Peace, MD  hydrOXYzine (ATARAX/VISTARIL) 50 MG tablet Take 1 tablet (50 mg total) by mouth 3 (three) times daily as needed for itching. 09/24/17   [name], PA-C  insulin aspart (NOVOLOG) 100 UNIT/ML FlexPen Inject 10 Units into the skin 3 (three) times daily  with meals. 09/20/17   Nicholes Mango, MD  Insulin Glargine (LANTUS SOLOSTAR) 100 UNIT/ML Solostar Pen Inject 48 Units into the skin 2 (two) times daily. 08/19/17   [name], MD  isosorbide mononitrate (IMDUR) 30 MG 24 hr tablet Take 3 tablets (90 mg total) by mouth daily. 10/28/17   [name], PA-C  ketotifen (ZADITOR) 0.025 % ophthalmic solution Place 1 drop into both eyes daily as needed (for dry eyes). 09/01/17   [name], PA-C  Lancet Devices [facility]) lancets Use as instructed daily. 04/14/17   Charlott Rakes, MD  loratadine (CLARITIN) 10 MG tablet Take 1 tablet (10 mg total) by mouth daily. 09/28/17   [name], PA-C  metolazone (ZAROXOLYN) 2.5 MG tablet Take 1 tablet (2.5 mg total) by mouth daily as needed (swelling). 10/11/17   Salary, Avel Peace, MD  MITIGARE 0.6 MG CAPS Take 1 capsule by mouth daily as needed. 10/14/17   [name], PA-C  montelukast (SINGULAIR) 10 MG tablet Take 1 tablet (10 mg total) by mouth at bedtime. 09/28/17   [name], PA-C  nitroGLYCERIN (NITROSTAT) 0.4 MG SL tablet Place 1 tablet (0.4 mg total) under the tongue every 5 (five) minutes x 3 doses as needed for chest pain. 03/19/17   Barrett, Evelene Croon, PA-C  omeprazole (PRILOSEC) 20 MG capsule Take 1 capsule (20 mg total) by mouth daily. 09/24/17   [name], PA-C  potassium chloride SA (K-DUR,KLOR-CON) 20 MEQ tablet Take 1 tablet (20 mEq total) by mouth 2 (two) times daily. 10/11/17   Salary, Avel Peace, MD  pregabalin (LYRICA) 100 MG capsule Take 1 capsule (100 mg total) by mouth 3 (three) times daily. 10/14/17   [name], PA-C  ticagrelor (BRILINTA) 90 MG TABS tablet Take 1 tablet (90 mg total) by mouth 2 (two) times daily. 10/14/17   [name], PA-C  torsemide (DEMADEX) 20 MG tablet Take 2 tablets (40 mg total) by mouth 2 (two) times daily. 10/11/17   Salary, Avel Peace, MD  valACYclovir (VALTREX) 500 MG tablet Take 1 tablet (500 mg  total) by mouth daily. 10/14/17   [name], PA-C  Vitamin D, Ergocalciferol, (DRISDOL) 50000 units CAPS capsule Take 1 capsule (50,000 Units total) by mouth every 30 (thirty) days. 10/14/17   [name], PA-C  gabapentin (NEURONTIN) 100 MG capsule Take 1 capsule (100 mg total) by mouth 3 (three) times daily. 08/19/17 09/01/17  [name], MD    Allergies  Allergen Reactions  . Shellfish Allergy Anaphylaxis and Swelling  . Diazepam Other (See Comments)    "felt like out of body experience"  . Morphine And Related Itching    Obstetric History: [id number], s/p SVD x 3  Social History   Socioeconomic History  . Marital status: Divorced    Spouse name: Not on file  . Number of children: Not on file  . Years of education: Not on file  . Highest education level: Not on file  Occupational History  . Not on file  Social Needs  . Financial resource strain: Not on file  . Food insecurity:    Worry: Not on file    Inability: Not on file  . Transportation needs:    Medical: Not on file    Non-medical: Not on file  Tobacco Use  . Smoking status: Former Smoker    Last attempt to quit: 10/25/1980    Years since quitting: 37.0  . Smokeless tobacco: Never Used  . Tobacco comment: quit smoking in 1982  Substance and Sexual Activity  . Alcohol use: Yes    Comment: 3 times in last year  . Drug use: No    Types: "Crack" cocaine    Comment: 08/21/2016 "clean since 05/1998"  . Sexual activity: Not on file    Comment: Not asked  Lifestyle  . Physical activity:    Days per week: Not on file    Minutes per session: Not on file  . Stress: Not on file  Relationships  . Social connections:    Talks on phone: Not on file    Gets together: Not on file    Attends religious service: Not on file    Active member of club or organization: Not on file    Attends meetings of clubs or organizations: Not on file    Relationship  status: Not on file  . Intimate partner violence:    Fear  of current or ex partner: Not on file    Emotionally abused: Not on file    Physically abused: Not on file    Forced sexual activity: Not on file  Other Topics Concern  . Not on file  Social History Narrative  . Not on file    Family History  Problem Relation Age of Onset  . Colon cancer Mother   . Heart attack Other   . Heart attack Maternal Grandmother   . Hypertension Sister   . Hypertension Brother   . Diabetes Paternal Grandmother   . Breast cancer Neg Hx     Review of Systems  Constitutional: Negative.   HENT: Negative.   Eyes: Negative.   Respiratory: Negative.   Cardiovascular: Negative.   Gastrointestinal: Negative.   Genitourinary: Negative.   Musculoskeletal: Negative.   Skin: Negative.   Neurological: Negative.   Psychiatric/Behavioral: Negative.      Physical Exam Vitals: BP (!) 144/98   Ht _0  (1.727 m)   Wt 288 lb (130.6 kg)   BMI 43.79 kg/m   Physical Exam  Constitutional: She is oriented to person, place, and time. She appears well-developed and well-nourished. No distress.  Morbidly obese  Genitourinary: Vagina normal and uterus normal. Pelvic exam was performed with patient supine. There is no rash, tenderness or lesion on the right labia. There is no rash, tenderness or lesion on the left labia. Right adnexum does not display mass, does not display tenderness and does not display fullness. Left adnexum does not display mass, does not display tenderness and does not display fullness. Cervix does not exhibit motion tenderness, lesion, friability or polyp.  Genitourinary Comments: Bimanual exam severely limited by patient's body habitus.   HENT:  Head: Normocephalic and atraumatic.  Eyes: Conjunctivae are normal. No scleral icterus.  Neck: Normal range of motion. Neck supple.  Cardiovascular: Normal rate and regular rhythm.  Pulmonary/Chest: Effort normal. No respiratory distress.  Abdominal: Soft. Bowel sounds are normal. She exhibits no  distension and no mass. There is no tenderness. There is no rebound and no guarding.  Musculoskeletal: Normal range of motion. She exhibits no edema.  Neurological: She is alert and oriented to person, place, and time. No cranial nerve deficit (grossly).  Skin: Skin is warm and dry. No erythema.  Psychiatric: She has a normal mood and affect. Her behavior is normal. Judgment normal.     Female chaperone present for pelvic and breast  portions of the physical exam   Assessment and Plan:  62 y.o. [phone] female here for routine annual gynecologic examination  Plan: Problem List Items Addressed This Visit    None    Visit Diagnoses    Women's annual routine gynecological examination    -  Primary   Relevant Orders   IGP, Aptima HPV, rfx 16/18,45   Pap smear for cervical cancer screening       Relevant Orders   IGP, Aptima HPV, rfx 16/18,45      Screening: -- Blood pressure screen managed by PCP -- Colonoscopy - per PCP -- Mammogram - will be due in 6 months. Patient understands it is her responsibility to call and schedule. -- Weight screening: obese: discussed management options, including lifestyle, dietary, and exercise. -- Depression screening negative (PHQ-9) -- Nutrition: normal -- cholesterol screening: per PCP -- osteoporosis screening: not due -- tobacco screening: not using -- alcohol screening: AUDIT questionnaire indicates low-risk  usage. -- family history of breast cancer screening: done. not at high risk. -- no evidence of domestic violence or intimate partner violence. -- STD screening: gonorrhea/chlamydia NAAT not collected per patient request. -- pap smear collected per ASCCP guidelines -- HPV vaccination series: not eligilbe   Prentice Docker, MD 10/29/2017 5:22 PM    CC: [name], PA-C [address], [place] [id number]

## 2017-10-29 ENCOUNTER — Encounter: Payer: Self-pay | Admitting: Obstetrics and Gynecology

## 2017-10-29 ENCOUNTER — Other Ambulatory Visit: Payer: Self-pay | Admitting: Physician Assistant

## 2017-10-29 DIAGNOSIS — I10 Essential (primary) hypertension: Secondary | ICD-10-CM

## 2017-11-01 ENCOUNTER — Encounter: Payer: Self-pay | Admitting: Physician Assistant

## 2017-11-01 ENCOUNTER — Encounter: Payer: Self-pay | Admitting: Gastroenterology

## 2017-11-01 ENCOUNTER — Encounter: Payer: Self-pay | Admitting: Cardiology

## 2017-11-02 ENCOUNTER — Ambulatory Visit: Payer: Medicaid Other | Attending: Internal Medicine

## 2017-11-02 DIAGNOSIS — R0683 Snoring: Secondary | ICD-10-CM | POA: Insufficient documentation

## 2017-11-02 LAB — IGP, APTIMA HPV, RFX 16/18,45
HPV Aptima: POSITIVE — AB
PAP Smear Comment: 0

## 2017-11-03 ENCOUNTER — Telehealth: Payer: Self-pay | Admitting: Physician Assistant

## 2017-11-03 NOTE — Telephone Encounter (Signed)
KAtie with [facility] orders for pt for nursing orders for copd 2 times weeks for 2 week and 1 time a week for 5 weeks  Her call back is [phone]  Thanks teri

## 2017-11-03 NOTE — Telephone Encounter (Signed)
Left message advising Joellen Jersey ok for COPD care.

## 2017-11-03 NOTE — Telephone Encounter (Signed)
OK for verbal nursing order for COPD care 2 times per week x 2 weeks then 1 time per week x 5 weeks.

## 2017-11-04 ENCOUNTER — Telehealth: Payer: Self-pay | Admitting: *Deleted

## 2017-11-04 ENCOUNTER — Telehealth: Payer: Self-pay | Admitting: Physician Assistant

## 2017-11-04 DIAGNOSIS — E118 Type 2 diabetes mellitus with unspecified complications: Principal | ICD-10-CM

## 2017-11-04 DIAGNOSIS — E1165 Type 2 diabetes mellitus with hyperglycemia: Secondary | ICD-10-CM

## 2017-11-04 DIAGNOSIS — IMO0002 Reserved for concepts with insufficient information to code with codable children: Secondary | ICD-10-CM

## 2017-11-04 MED ORDER — INSULIN GLARGINE 100 UNIT/ML SOLOSTAR PEN: 48.0000 [IU] | PEN_INJECTOR | Freq: Two times a day (BID) | SUBCUTANEOUS | 5 refills | Status: DC

## 2017-11-04 NOTE — Telephone Encounter (Signed)
Sent Lantus.

## 2017-11-04 NOTE — Telephone Encounter (Signed)
Error

## 2017-11-04 NOTE — Telephone Encounter (Signed)
Pt contacted office for refill request on the following medications:  1. Insulin Glargine (LANTUS SOLOSTAR) 100 UNIT/ML Solostar Pen  2. Lantus pen needles  [facility] Please advise. Thanks TNP

## 2017-11-05 DIAGNOSIS — G4710 Hypersomnia, unspecified: Secondary | ICD-10-CM | POA: Diagnosis not present

## 2017-11-10 ENCOUNTER — Telehealth: Payer: Self-pay | Admitting: *Deleted

## 2017-11-10 DIAGNOSIS — R079 Chest pain, unspecified: Secondary | ICD-10-CM

## 2017-11-10 DIAGNOSIS — R0600 Dyspnea, unspecified: Secondary | ICD-10-CM

## 2017-11-10 NOTE — Telephone Encounter (Signed)
Left message to back for results   Final impression: Snoring Sleep apnea not seen in study.  Recommendations:  Follow up with referring physician. Sleep hygiene measures, going to bed at reg time, not watching tv in bed.

## 2017-11-10 NOTE — Telephone Encounter (Signed)
Patient aware of results.ss

## 2017-11-12 ENCOUNTER — Other Ambulatory Visit: Payer: Self-pay | Admitting: Physician Assistant

## 2017-11-12 ENCOUNTER — Telehealth: Payer: Self-pay | Admitting: Obstetrics and Gynecology

## 2017-11-12 MED ORDER — PEN NEEDLES 30G X 8 MM MISC: 1.0000 | Freq: Three times a day (TID) | 11 refills | Status: DC

## 2017-11-12 NOTE — Telephone Encounter (Signed)
Pt requesting to order her some needles for her Novolog pen, pt uses [employer] [facility] # to Pharmacy is [phone].  Thanks CC

## 2017-11-12 NOTE — Telephone Encounter (Signed)
Left generic vm 

## 2017-11-13 NOTE — Progress Notes (Deleted)
Patient ID: [name], female    DOB: [birth year], 62 y.o.   MRN: [id number]  HPI  [name] is a 63 y/o female with a history of asthma, CAD, DM, HTN, CKD, hepatitis C, pancreatitis, obstructive sleep apnea, previous tobacco use and chronic heart failure.   Echo report from 09/15/17 reviewed and showed an EF of 65-70% along with moderate AS, trivial AR and calcified mitral valve annulus. Cardiac catheterization done 04/29/17 which showed a widely patient mid LAD stent and normal left ventricular filling pressure.   Admitted 10/03/17 due to acute on chronic HF due to noncompliance. Cardiology consult obtained. Initially needed IV lasix and then transitioned to oral diuretics. Nephrology also saw patient and decided she didn't need dialysis at this time. Discharged after 8 days. Admitted 09/14/17 due to acute on chronic HF. Initially needed lasix drip and then transitioned to oral lasix. Cardiology and pulmonology consults were obtained. Had acute kidney injury which did improve. Discussed with nephrology. Discharged after 6 days.   She presents today for a follow-up visit with a chief complaint of   Past Medical History:  Diagnosis Date  . Aortic stenosis    Echo 8/18: mean 13, peak 28, LVOT/AV mean velocity 0.51  . Asthma    As a child   . Bronchitis   . CAD (coronary artery disease)    a. 09/2016: 50% Ost 1st Mrg stenosis, 50% 2nd Mrg stenosis, 20% Mid-Cx, 95% Prox LAD, 40% mid-LAD, and 10% dist-LAD stenosis. Staged PCI with DES to Prox-LAD.   [name] Chronic combined systolic and diastolic CHF (congestive heart failure) ([place]) 2011   echo 2/18: EF 55-60, normal wall motion, grade 2 diastolic dysfunction, trivial AI // echo 3/18: Septal and apical HK, EF 45-50, normal wall motion, trivial AI, mild LAE, PASP 38 // echo 8/18: EF 60-65, normal wall motion, grade 1 diastolic dysfunction, calcified aortic valve leaflets, mild aortic stenosis (mean 13, peak 28, LVOT/AV mean velocity 0.51), mild AI, moderate  MAC, mild LAE, trivial TR   . Chronic kidney disease   . Complication of anesthesia   . Diabetes mellitus Dx 1989  . Hepatitis C Dx 2013  . Hypertension Dx 1989  . Obesity   . Obstructive sleep apnea   . Pancreatitis 2013  . Refusal of blood transfusions as patient is Jehovah's Witness   . Tendinitis   . Ulcer 2010   Past Surgical History:  Procedure Laterality Date  . APPLICATION OF WOUND VAC Left 08/14/2017   Procedure: APPLICATION OF WOUND VAC Exchange;  Surgeon: Robert Bellow, MD;  Location: [facility];  Service: General;  Laterality: Left;  . CHOLECYSTECTOMY    . CORONARY STENT INTERVENTION N/A 09/18/2016   Procedure: Coronary Stent Intervention;  Surgeon: Troy Sine, MD;  Location: [place] [facility];  Service: Cardiovascular;  Laterality: N/A;  . DRESSING CHANGE UNDER ANESTHESIA Left 08/15/2017   Procedure: exploration of wound for bleeding;  Surgeon: Robert Bellow, MD;  Location: [facility];  Service: General;  Laterality: Left;  . EYE SURGERY    . INCISION AND DRAINAGE ABSCESS Left 08/12/2017   Procedure: INCISION AND DRAINAGE ABSCESS;  Surgeon: Robert Bellow, MD;  Location: [facility];  Service: General;  Laterality: Left;  . KNEE ARTHROSCOPY    . LEFT HEART CATH N/A 09/18/2016   Procedure: Left Heart Cath;  Surgeon: Troy Sine, MD;  Location: [place] [facility];  Service: Cardiovascular;  Laterality: N/A;  . LEFT HEART CATH AND CORONARY  ANGIOGRAPHY N/A 09/16/2016   Procedure: Left Heart Cath and Coronary Angiography;  Surgeon: Burnell Blanks, MD;  Location: [place] [facility];  Service: Cardiovascular;  Laterality: N/A;  . LEFT HEART CATH AND CORONARY ANGIOGRAPHY N/A 04/29/2017   Procedure: LEFT HEART CATH AND CORONARY ANGIOGRAPHY;  Surgeon: Nelva Bush, MD;  Location: [place] [facility];  Service: Cardiovascular;  Laterality: N/A;  . TUBAL LIGATION    . TUBAL LIGATION     Family History  Problem Relation Age of Onset  . Colon cancer Mother   .  Heart attack Other   . Heart attack Maternal Grandmother   . Hypertension Sister   . Hypertension Brother   . Diabetes Paternal Grandmother   . Breast cancer Neg Hx    Social History   Tobacco Use  . Smoking status: Former Smoker    Last attempt to quit: 10/25/1980    Years since quitting: 37.0  . Smokeless tobacco: Never Used  . Tobacco comment: quit smoking in 1982  Substance Use Topics  . Alcohol use: Yes    Comment: 3 times in last year   Allergies  Allergen Reactions  . Shellfish Allergy Anaphylaxis and Swelling  . Diazepam Other (See Comments)    "felt like out of body experience"  . Morphine And Related Itching     Review of Systems  Constitutional: Positive for fatigue. Negative for appetite change.  HENT: Negative for congestion, postnasal drip and sore throat.   Eyes: Positive for visual disturbance (blurry vision).  Respiratory: Negative for chest tightness and shortness of breath.   Cardiovascular: Positive for leg swelling (better). Negative for chest pain and palpitations.  Gastrointestinal: Negative for abdominal distention and abdominal pain.  Endocrine: Negative.   Genitourinary: Negative.   Musculoskeletal: Positive for arthralgias (left leg). Negative for back pain and neck pain.  Skin: Negative.   Allergic/Immunologic: Negative.   Neurological: Positive for headaches. Negative for dizziness and light-headedness.  Hematological: Negative for adenopathy. Does not bruise/bleed easily.  Psychiatric/Behavioral: Negative for dysphoric mood and sleep disturbance (sleeping on 2 pillows). The patient is not nervous/anxious.     Physical Exam  Constitutional: She is oriented to person, place, and time. She appears well-developed and well-nourished.  HENT:  Head: Normocephalic and atraumatic.  Neck: Normal range of motion. Neck supple.  Cardiovascular: Normal rate and regular rhythm.  Pulmonary/Chest: Effort normal. She has no wheezes. She has no rales.   Abdominal: Soft. She exhibits no distension. There is no tenderness.  Musculoskeletal: She exhibits edema (1+ pitting edema in bilateral lower legs). She exhibits no tenderness.  Neurological: She is alert and oriented to person, place, and time.  Skin: Skin is warm and dry.  Psychiatric: She has a normal mood and affect. Her behavior is normal. Thought content normal.  Nursing note and vitals reviewed.  Assessment & Plan:  1: Chronic heart failure with preserved ejection fraction- - NYHA class II - euvolemic today - weighing daily and has noticed a gradual weight loss. Reminded to call for an overnight weight gain of >2 pounds or a weekly weight gain of >5 pounds - weight down 16 pounds from 09/30/17 - not adding salt. Reminded to closely follow a 2078m sodium diet - saw cardiology ([place] 08/05/17  - saw pulmonology (Ashby Dawes 10/26/17  2: HTN- - BP ok today - saw PCP (J. Burnette) 10/14/17 - BMP on 10/11/17 reviewed and showed sodium 139, potassium 3.6 and GFR 21  3: Diabetes- - fasting glucose at home was  -  sees nephrology [facility])  - has an eye appointment later today regarding her worsening vision  4: Lymphedema- - stage 2 - edema much better since diuretic has been changed - currently limited in exercise due to her shortness of breath - should edema persist, consider lymphapress compression boots  Patient did not bring her medications nor a list. Each medication was verbally reviewed with the patient and she was encouraged to bring the bottles to every visit to confirm accuracy of list.

## 2017-11-15 ENCOUNTER — Other Ambulatory Visit: Payer: Self-pay

## 2017-11-15 ENCOUNTER — Encounter: Payer: Self-pay | Admitting: Physician Assistant

## 2017-11-15 ENCOUNTER — Ambulatory Visit: Payer: Medicaid Other | Admitting: Family

## 2017-11-15 ENCOUNTER — Ambulatory Visit: Payer: Medicaid Other | Admitting: Physician Assistant

## 2017-11-15 VITALS — BP 152/70 | HR 90 | Wt 296.0 lb

## 2017-11-15 DIAGNOSIS — R400 Somnolence: Secondary | ICD-10-CM | POA: Diagnosis not present

## 2017-11-15 DIAGNOSIS — E1165 Type 2 diabetes mellitus with hyperglycemia: Secondary | ICD-10-CM | POA: Diagnosis not present

## 2017-11-15 DIAGNOSIS — G629 Polyneuropathy, unspecified: Secondary | ICD-10-CM | POA: Diagnosis not present

## 2017-11-15 DIAGNOSIS — E118 Type 2 diabetes mellitus with unspecified complications: Secondary | ICD-10-CM

## 2017-11-15 DIAGNOSIS — R42 Dizziness and giddiness: Secondary | ICD-10-CM | POA: Diagnosis not present

## 2017-11-15 DIAGNOSIS — I5042 Chronic combined systolic (congestive) and diastolic (congestive) heart failure: Secondary | ICD-10-CM | POA: Diagnosis not present

## 2017-11-15 DIAGNOSIS — IMO0002 Reserved for concepts with insufficient information to code with codable children: Secondary | ICD-10-CM

## 2017-11-15 MED ORDER — PEN NEEDLES 30G X 8 MM MISC: 1.0000 | Freq: Three times a day (TID) | 11 refills | Status: DC

## 2017-11-15 MED ORDER — PREGABALIN 300 MG PO CAPS: 300.0000 mg | ORAL_CAPSULE | Freq: Every day | ORAL | 1 refills | Status: DC

## 2017-11-15 MED ORDER — INSULIN ASPART 100 UNIT/ML FLEXPEN: 10.0000 [IU] | PEN_INJECTOR | Freq: Three times a day (TID) | SUBCUTANEOUS | 3 refills | Status: DC

## 2017-11-15 NOTE — Patient Instructions (Signed)
Lyrica 300mg  to be taken once daily at bedtime now. STOP lyrica 100mg  (hold on to them but set away in case we have to go back to that dose)

## 2017-11-15 NOTE — Progress Notes (Addendum)
Patient: [name] Female    DOB: Sep 28, 1955   62 y.o.   MRN: [id number] Visit Date: 11/15/2017  Today's Provider: [name], PA-C   Chief Complaint  Patient presents with  . Congestive Heart Failure   Subjective:    Congestive Heart Failure  Presents for follow-up visit. Associated symptoms include fatigue and shortness of breath. Pertinent negatives include no palpitations. The symptoms have been stable. Compliance problems include adherence to diet and adherence to exercise.        Allergies  Allergen Reactions  . Shellfish Allergy Anaphylaxis and Swelling  . Diazepam Other (See Comments)    "felt like out of body experience"  . Morphine And Related Itching     Current Outpatient Medications:  .  albuterol (PROVENTIL) (2.5 MG/3ML) 0.083% nebulizer solution, Take 3 mLs (2.5 mg total) by nebulization every 6 (six) hours as needed for wheezing or shortness of breath., Disp: 150 mL, Rfl: 1 .  albuterol (VENTOLIN HFA) 108 (90 Base) MCG/ACT inhaler, Inhale 2 puffs into the lungs every 4 (four) hours as needed for wheezing or shortness of breath., Disp: 54 g, Rfl: 3 .  allopurinol (ZYLOPRIM) 300 MG tablet, Take 1 tablet (300 mg total) by mouth daily., Disp: 90 tablet, Rfl: 1 .  aspirin EC 81 MG EC tablet, Take 1 tablet (81 mg total) by mouth daily., Disp: , Rfl:  .  atorvastatin (LIPITOR) 80 MG tablet, Take 1 tablet (80 mg total) by mouth daily at 6 PM., Disp: 90 tablet, Rfl: 3 .  Blood Glucose Monitoring Suppl (ACCU-CHEK AVIVA) device, Use as instructed daily., Disp: 1 each, Rfl: 0 .  Blood Pressure Monitoring (ADULT BLOOD PRESSURE CUFF LG) KIT, Check blood pressure once daily, Disp: 1 each, Rfl: 0 .  carvedilol (COREG) 25 MG tablet, TAKE 1 TABLET BY MOUTH 2 TIMES DAILY WITH A MEAL., Disp: 180 tablet, Rfl: 3 .  docusate sodium (COLACE) 100 MG capsule, Take 1 capsule (100 mg total) by mouth 2 (two) times daily as needed for mild constipation., Disp: 30 capsule,  Rfl: 0 .  Dulaglutide (TRULICITY) 3.72 BM/2.1JD SOPN, Inject 0.75 mg into the skin once a week., Disp: 4 pen, Rfl: 3 .  ferrous sulfate 325 (65 FE) MG tablet, Take 1 tablet (325 mg total) by mouth 2 (two) times daily with a meal., Disp: 180 tablet, Rfl: 3 .  fluticasone (FLONASE) 50 MCG/ACT nasal spray, Place 2 sprays into both nostrils daily as needed for allergies or rhinitis., Disp: 48 g, Rfl: 1 .  Fluticasone-Salmeterol (ADVAIR DISKUS) 250-50 MCG/DOSE AEPB, Inhale 1 puff into the lungs 2 (two) times daily., Disp: 180 each, Rfl: 3 .  glucose blood (ACCU-CHEK AVIVA) test strip, Use as instructed daily, Disp: 100 each, Rfl: 12 .  glucose blood (TRUE METRIX BLOOD GLUCOSE TEST) test strip, Use as instructed, Disp: 100 each, Rfl: 12 .  hydrALAZINE (APRESOLINE) 50 MG tablet, Take 1 tablet (50 mg total) by mouth every 8 (eight) hours., Disp: 90 tablet, Rfl: 0 .  hydrOXYzine (ATARAX/VISTARIL) 50 MG tablet, Take 1 tablet (50 mg total) by mouth 3 (three) times daily as needed for itching., Disp: 90 tablet, Rfl: 3 .  insulin aspart (NOVOLOG) 100 UNIT/ML FlexPen, Inject 10 Units into the skin 3 (three) times daily with meals., Disp: 15 mL, Rfl: 3 .  Insulin Glargine (LANTUS SOLOSTAR) 100 UNIT/ML Solostar Pen, Inject 48 Units into the skin 2 (two) times daily., Disp: 24 mL, Rfl: 5 .  Insulin Pen Needle (PEN NEEDLES) 30G X 8 MM MISC, 1 each by Does not apply route 3 (three) times daily before meals. AC for novolog injections, Disp: 100 each, Rfl: 11 .  isosorbide mononitrate (IMDUR) 30 MG 24 hr tablet, TAKE 3 TABLETS(90 MG) BY MOUTH DAILY, Disp: 270 tablet, Rfl: 1 .  ketotifen (ZADITOR) 0.025 % ophthalmic solution, Place 1 drop into both eyes daily as needed (for dry eyes)., Disp: 10 mL, Rfl: 2 .  Lancet Devices (ACCU-CHEK SOFTCLIX) lancets, Use as instructed daily., Disp: 1 each, Rfl: 5 .  loratadine (CLARITIN) 10 MG tablet, Take 1 tablet (10 mg total) by mouth daily., Disp: 90 tablet, Rfl: 1 .  metolazone  (ZAROXOLYN) 2.5 MG tablet, Take 1 tablet (2.5 mg total) by mouth daily as needed (swelling)., Disp: 30 tablet, Rfl: 0 .  MITIGARE 0.6 MG CAPS, Take 1 capsule by mouth daily as needed., Disp: 90 capsule, Rfl: 1 .  nitroGLYCERIN (NITROSTAT) 0.4 MG SL tablet, Place 1 tablet (0.4 mg total) under the tongue every 5 (five) minutes x 3 doses as needed for chest pain., Disp: 25 tablet, Rfl: 12 .  omeprazole (PRILOSEC) 20 MG capsule, Take 1 capsule (20 mg total) by mouth daily., Disp: 90 capsule, Rfl: 3 .  potassium chloride SA (K-DUR,KLOR-CON) 20 MEQ tablet, Take 1 tablet (20 mEq total) by mouth 2 (two) times daily., Disp: 60 tablet, Rfl: 0 .  pregabalin (LYRICA) 100 MG capsule, Take 1 capsule (100 mg total) by mouth 3 (three) times daily., Disp: 270 capsule, Rfl: 1 .  ticagrelor (BRILINTA) 90 MG TABS tablet, Take 1 tablet (90 mg total) by mouth 2 (two) times daily., Disp: 180 tablet, Rfl: 3 .  torsemide (DEMADEX) 20 MG tablet, Take 2 tablets (40 mg total) by mouth 2 (two) times daily., Disp: 120 tablet, Rfl: 0 .  valACYclovir (VALTREX) 500 MG tablet, Take 1 tablet (500 mg total) by mouth daily., Disp: 90 tablet, Rfl: 3 .  Vitamin D, Ergocalciferol, (DRISDOL) 50000 units CAPS capsule, Take 1 capsule (50,000 Units total) by mouth every 30 (thirty) days., Disp: 4 capsule, Rfl: 1 .  montelukast (SINGULAIR) 10 MG tablet, Take 1 tablet (10 mg total) by mouth at bedtime., Disp: 90 tablet, Rfl: 1  Review of Systems  Constitutional: Positive for fatigue.  HENT: Positive for ear pain (right). Negative for congestion, hearing loss, nosebleeds, postnasal drip, rhinorrhea, sinus pressure, sinus pain, sore throat and tinnitus.   Respiratory: Positive for shortness of breath. Negative for chest tightness.   Cardiovascular: Negative for palpitations.  Neurological: Positive for weakness and headaches. Negative for dizziness and light-headedness.  Psychiatric/Behavioral: Positive for dysphoric mood and sleep disturbance.      Social History   Tobacco Use  . Smoking status: Former Smoker    Last attempt to quit: 10/25/1980    Years since quitting: 37.0  . Smokeless tobacco: Never Used  . Tobacco comment: quit smoking in 1982  Substance Use Topics  . Alcohol use: Yes    Comment: 3 times in last year   Objective:   BP (!) 152/70 (BP Location: Left Arm, Patient Position: Sitting, Cuff Size: Large)   Pulse 90   Wt 296 lb (134.3 kg)   SpO2 95%   BMI 45.01 kg/m  Vitals:   11/15/17 1439  BP: (!) 152/70  Pulse: 90  SpO2: 95%  Weight: 296 lb (134.3 kg)     Physical Exam  Constitutional: She appears well-developed and well-nourished. No distress.  Patient struggling to stay  awake today in office.  HENT:  Head: Normocephalic and atraumatic.  Right Ear: Hearing, tympanic membrane, external ear and ear canal normal.  Left Ear: Hearing, tympanic membrane, external ear and ear canal normal.  Nose: Nose normal.  Mouth/Throat: Uvula is midline, oropharynx is clear and moist and mucous membranes are normal. No oropharyngeal exudate.  Eyes: Pupils are equal, round, and reactive to light. Conjunctivae are normal. Right eye exhibits no discharge. Left eye exhibits no discharge. No scleral icterus.  Neck: Normal range of motion. Neck supple. No tracheal deviation present. No thyromegaly present.  Cardiovascular: Normal rate and regular rhythm. Exam reveals no gallop and no friction rub.  Murmur heard. Pulmonary/Chest: Effort normal. No stridor. No respiratory distress. She has no wheezes. She has rhonchi (expiratory throughout). She has no rales.  Musculoskeletal: She exhibits edema (1+ pitting edema today).  Lymphadenopathy:    She has no cervical adenopathy.  Skin: Skin is warm and dry. She is not diaphoretic.  Vitals reviewed.      Assessment & Plan:     1. Daytime somnolence Sleep study was negative for apnea. Possibly due to medication interactions. Discussed decreasing hydroxyzine but patient  declines at this time, stating she needs to for the itching and it is helping. Will change Lyrica to '300mg'$  to take at bedtime instead of being broken up to 3 times through the day to see if this will help her sleep better and decrease daytime somnolence. I will see her back in 6 weeks to see how she is doing with the change.   2. Neuropathy See above medical treatment plan. - pregabalin (LYRICA) 300 MG capsule; Take 1 capsule (300 mg total) by mouth at bedtime.  Dispense: 90 capsule; Refill: 1  3. Diabetes mellitus type 2, uncontrolled, with complications (HCC) Stable. Diagnosis pulled for medication refill. Continue current medical treatment plan. - Insulin Pen Needle (PEN NEEDLES) 30G X 8 MM MISC; 1 each by Does not apply route 3 (three) times daily before meals. AC for novolog injections  Dispense: 100 each; Refill: 11 - insulin aspart (NOVOLOG) 100 UNIT/ML FlexPen; Inject 10 Units into the skin 3 (three) times daily with meals.  Dispense: 15 mL; Refill: 3  4. Chronic combined systolic and diastolic CHF (congestive heart failure) [facility]) Missed appointment today with [facility]. Rescheduled for 11/23/17. Continue current medical treatment plan. Patient reports weight being stable (fluctuates between 293-296 per patient).   5. Disequilibrium Possibly medication induced. TM and right ear canal looked normal today. Continue flonase. Discussed vestibular rehab for the off balance sensation, but she declines at this time.        [name], PA-C  [facility]

## 2017-11-16 ENCOUNTER — Other Ambulatory Visit: Payer: Self-pay | Admitting: Physician Assistant

## 2017-11-16 DIAGNOSIS — E876 Hypokalemia: Secondary | ICD-10-CM

## 2017-11-16 DIAGNOSIS — Z9109 Other allergy status, other than to drugs and biological substances: Secondary | ICD-10-CM

## 2017-11-16 NOTE — Telephone Encounter (Signed)
[facility] [employer] called for a new rx for her  K-DUR-Klor CON 20 meg  [employer]

## 2017-11-17 ENCOUNTER — Other Ambulatory Visit: Payer: Self-pay | Admitting: Physician Assistant

## 2017-11-17 DIAGNOSIS — E876 Hypokalemia: Secondary | ICD-10-CM

## 2017-11-17 MED ORDER — POTASSIUM CHLORIDE CRYS ER 20 MEQ PO TBCR: 20.0000 meq | EXTENDED_RELEASE_TABLET | Freq: Two times a day (BID) | ORAL | 0 refills | Status: DC

## 2017-11-17 MED ORDER — POTASSIUM CHLORIDE CRYS ER 20 MEQ PO TBCR: 20.0000 meq | EXTENDED_RELEASE_TABLET | Freq: Two times a day (BID) | ORAL | 5 refills | Status: DC

## 2017-11-17 NOTE — Progress Notes (Signed)
Potassium reordered

## 2017-11-19 ENCOUNTER — Ambulatory Visit: Payer: Medicaid Other | Admitting: Cardiology

## 2017-11-19 NOTE — Progress Notes (Deleted)
Cardiology Office Note:    Date:  11/19/2017   ID:  [name], DOB 1956-04-17, MRN [id number]  PCP:  [name], PA-C  Cardiologist:  Sherren Mocha, MD  Referring MD: Florian Buff*   No chief complaint on file. ***  History of Present Illness:    [name] is a 62 y.o. female with a past medical history significant for CAD s/p DES to Prox LAD 09/2016, hypertension, diabetes, CKD, hepatitis C, pancreatitis, previous tobacco use and chronic heart failure.  [name] was recently admitted to the hospital 10/03/17-10/11/17 for acute on chronic diastolic CHF with anasarca and shortness of breath. Her symptoms were noted to be secondary to noncompliance with medical therapy and lifestyle. She was treated with IV Lasix, converted to oral torsemide with metolazone as needed. She was recommended for right heart catheterization as outpatient. She also had acute on chronic kidney disease stage IV and was seen by nephrology. No dialysis was recommended at the time. She has also been admitted earlier in March for CHF.   [name] is followed in the heart failure clinic at [facility].   ------------------------------------------------------   diastolic CHF    --> HF clinic, possible RHC  Hypertension: hydralazine recently increased to 75 mg 3 times a day in the hospital. A voiding amlodipineif possible.  CAD status post stent to LAD and 07/2016. She continues on aspirin, Brilinta, Coreg, Imdur and Lipitor Diabetes type 2 on insulin: Poorly controlled  CKD stage IV: seen by Dr.Kolluru in the hospital and planned for follow up in his office with labs. Aortic stenosis  obesity  Past Medical History:  Diagnosis Date  . Aortic stenosis    Echo 8/18: mean 13, peak 28, LVOT/AV mean velocity 0.51  . Asthma    As a child   . Bronchitis   . CAD (coronary artery disease)    a. 09/2016: 50% Ost 1st Mrg stenosis, 50% 2nd Mrg stenosis, 20% Mid-Cx, 95% Prox LAD, 40% mid-LAD, and 10%  dist-LAD stenosis. Staged PCI with DES to Prox-LAD.   [name] Chronic combined systolic and diastolic CHF (congestive heart failure) ([place]) 2011   echo 2/18: EF 55-60, normal wall motion, grade 2 diastolic dysfunction, trivial AI // echo 3/18: Septal and apical HK, EF 45-50, normal wall motion, trivial AI, mild LAE, PASP 38 // echo 8/18: EF 60-65, normal wall motion, grade 1 diastolic dysfunction, calcified aortic valve leaflets, mild aortic stenosis (mean 13, peak 28, LVOT/AV mean velocity 0.51), mild AI, moderate MAC, mild LAE, trivial TR   . Chronic kidney disease   . Complication of anesthesia   . Diabetes mellitus Dx 1989  . Hepatitis C Dx 2013  . Hypertension Dx 1989  . Obesity   . Obstructive sleep apnea   . Pancreatitis 2013  . Refusal of blood transfusions as patient is Jehovah's Witness   . Tendinitis   . Ulcer 2010    Past Surgical History:  Procedure Laterality Date  . APPLICATION OF WOUND VAC Left 08/14/2017   Procedure: APPLICATION OF WOUND VAC Exchange;  Surgeon: Robert Bellow, MD;  Location: [facility];  Service: General;  Laterality: Left;  . CHOLECYSTECTOMY    . CORONARY STENT INTERVENTION N/A 09/18/2016   Procedure: Coronary Stent Intervention;  Surgeon: Troy Sine, MD;  Location: [place] [facility];  Service: Cardiovascular;  Laterality: N/A;  . DRESSING CHANGE UNDER ANESTHESIA Left 08/15/2017   Procedure: exploration of wound for bleeding;  Surgeon: Robert Bellow, MD;  Location: [facility];  Service: General;  Laterality: Left;  . EYE SURGERY    . INCISION AND DRAINAGE ABSCESS Left 08/12/2017   Procedure: INCISION AND DRAINAGE ABSCESS;  Surgeon: Robert Bellow, MD;  Location: [facility];  Service: General;  Laterality: Left;  . KNEE ARTHROSCOPY    . LEFT HEART CATH N/A 09/18/2016   Procedure: Left Heart Cath;  Surgeon: Troy Sine, MD;  Location: [place] [facility];  Service: Cardiovascular;  Laterality: N/A;  . LEFT HEART CATH AND CORONARY ANGIOGRAPHY N/A 09/16/2016     Procedure: Left Heart Cath and Coronary Angiography;  Surgeon: Burnell Blanks, MD;  Location: [place] [facility];  Service: Cardiovascular;  Laterality: N/A;  . LEFT HEART CATH AND CORONARY ANGIOGRAPHY N/A 04/29/2017   Procedure: LEFT HEART CATH AND CORONARY ANGIOGRAPHY;  Surgeon: Nelva Bush, MD;  Location: [place] [facility];  Service: Cardiovascular;  Laterality: N/A;  . TUBAL LIGATION    . TUBAL LIGATION      Current Medications: No outpatient medications have been marked as taking for the 11/19/17 encounter (Appointment) with Daune Perch, NP.     Allergies:   Shellfish allergy; Diazepam; and Morphine and related   Social History   Socioeconomic History  . Marital status: Divorced    Spouse name: Not on file  . Number of children: Not on file  . Years of education: Not on file  . Highest education level: Not on file  Occupational History  . Not on file  Social Needs  . Financial resource strain: Not on file  . Food insecurity:    Worry: Not on file    Inability: Not on file  . Transportation needs:    Medical: Not on file    Non-medical: Not on file  Tobacco Use  . Smoking status: Former Smoker    Last attempt to quit: 10/25/1980    Years since quitting: 37.0  . Smokeless tobacco: Never Used  . Tobacco comment: quit smoking in 1982  Substance and Sexual Activity  . Alcohol use: Yes    Comment: 3 times in last year  . Drug use: No    Types: "Crack" cocaine    Comment: 08/21/2016 "clean since 05/1998"  . Sexual activity: Not on file    Comment: Not asked  Lifestyle  . Physical activity:    Days per week: Not on file    Minutes per session: Not on file  . Stress: Not on file  Relationships  . Social connections:    Talks on phone: Not on file    Gets together: Not on file    Attends religious service: Not on file    Active member of club or organization: Not on file    Attends meetings of clubs or organizations: Not on file    Relationship  status: Not on file  Other Topics Concern  . Not on file  Social History Narrative  . Not on file     Family History: The patient's ***family history includes Colon cancer in her mother; Diabetes in her paternal grandmother; Heart attack in her maternal grandmother and other; Hypertension in her brother and sister. There is no history of Breast cancer. ROS:   Please see the history of present illness.    *** All other systems reviewed and are negative.  EKGs/Labs/Other Studies Reviewed:    The following studies were reviewed today:  Echocardiogram: 09/15/2017 Study Conclusions  - Left ventricle: The cavity size was at the upper  limits of normal. Wall thickness was increased in a pattern of mild LVH. Systolic function was vigorous. The estimated ejection fraction was in the range of 65% to 70%. Wall motion was normal; there were no regional wall motion abnormalities. Doppler parameters are consistent with abnormal left ventricular relaxation (grade 1 diastolic dysfunction). Doppler parameters are consistent with high ventricular filling pressure. - Aortic valve: Transvalvular velocity was increased. There was moderate stenosis. There was trivial regurgitation. Peak velocity (S): 308 cm/s. Mean gradient (S): 20 mm Hg. Valve area (VTI): 1.34 cm^2. Valve area (Vmax): 1.11 cm^2. - Ascending aorta: The ascending aorta was mildly dilated. - Mitral valve: Calcified annulus. Mildly thickened leaflets . - Left atrium: The atrium was mildly dilated. - Right ventricle: The cavity size was normal. Systolic function was normal. - Inferior vena cava: The vessel was dilated. The respirophasic diameter changes were blunted (<50%), consistent with elevated central venous pressure. - Pericardium, extracardiac: A trivial pericardial effusion was identified posterior to the heart.     EKG:  EKG is *** ordered today.  The ekg ordered today demonstrates  ***  Recent Labs: 08/05/2017: NT-Pro BNP 401 08/11/2017: TSH 1.444 10/03/2017: ALT 74; B Natriuretic Peptide 147.0 10/07/2017: Magnesium 1.7 10/09/2017: Hemoglobin 9.5; Platelets 257 10/11/2017: BUN 97; Creatinine, Ser 2.70; Potassium 3.6; Sodium 139   Recent Lipid Panel    Component Value Date/Time   CHOL 122 09/13/2016 1245   TRIG 168 (H) 09/13/2016 1245   HDL 36 (L) 09/13/2016 1245   CHOLHDL 3.4 09/13/2016 1245   VLDL 34 09/13/2016 1245   LDLCALC 52 09/13/2016 1245    Physical Exam:    VS:  There were no vitals taken for this visit.    Wt Readings from Last 3 Encounters:  11/15/17 296 lb (134.3 kg)  10/28/17 288 lb (130.6 kg)  10/26/17 299 lb (135.6 kg)     Physical Exam***   ASSESSMENT:    No diagnosis found. PLAN:    In order of problems listed above:  1. ***   Medication Adjustments/Labs and Tests Ordered: Current medicines are reviewed at length with the patient today.  Concerns regarding medicines are outlined above. Labs and tests ordered and medication changes are outlined in the patient instructions below:  There are no Patient Instructions on file for this visit.   Signed, Daune Perch, NP  11/19/2017 4:36 AM    [place]

## 2017-11-19 NOTE — Progress Notes (Signed)
Patient ID: [name], female    DOB: 01/11/1956, 62 y.o.   MRN: [id number]  HPI  [name] is a 62 y/o female with a history of asthma, CAD, DM, HTN, CKD, hepatitis C, pancreatitis, obstructive sleep apnea, previous tobacco use and chronic heart failure.   Echo report from 09/15/17 reviewed and showed an EF of 65-70% along with moderate AS, trivial AR and calcified mitral valve annulus. Cardiac catheterization done 04/29/17 which showed a widely patient mid LAD stent and normal left ventricular filling pressure.   Admitted 10/03/17 due to acute on chronic HF due to noncompliance. Cardiology consult obtained. Initially needed IV lasix and then transitioned to oral diuretics. Nephrology also saw patient and decided she didn't need dialysis at this time. Discharged after 8 days. Admitted 09/14/17 due to acute on chronic HF. Initially needed lasix drip and then transitioned to oral lasix. Cardiology and pulmonology consults were obtained. Had acute kidney injury which did improve. Discussed with nephrology. Discharged after 6 days.   She presents today for a follow-up visit with a chief complaint of moderate fatigue upon minimal exertion. She describes this as chronic in nature. She does feel like she's becoming more tired. She has associated edema, visual disturbances and headaches. She denies any difficulty sleeping, abdominal distention, palpitations, chest pain, shortness of breath, dizziness or weight gain. Due to have a cataract removed next week and has pre-op later today. Does not have a glucometer so doesn't check her glucose at home.   Past Medical History:  Diagnosis Date  . Aortic stenosis    Echo 8/18: mean 13, peak 28, LVOT/AV mean velocity 0.51  . Asthma    As a child   . Bronchitis   . CAD (coronary artery disease)    a. 09/2016: 50% Ost 1st Mrg stenosis, 50% 2nd Mrg stenosis, 20% Mid-Cx, 95% Prox LAD, 40% mid-LAD, and 10% dist-LAD stenosis. Staged PCI with DES to Prox-LAD.   [name] Chronic  combined systolic and diastolic CHF (congestive heart failure) ([place]) 2011   echo 2/18: EF 55-60, normal wall motion, grade 2 diastolic dysfunction, trivial AI // echo 3/18: Septal and apical HK, EF 45-50, normal wall motion, trivial AI, mild LAE, PASP 38 // echo 8/18: EF 60-65, normal wall motion, grade 1 diastolic dysfunction, calcified aortic valve leaflets, mild aortic stenosis (mean 13, peak 28, LVOT/AV mean velocity 0.51), mild AI, moderate MAC, mild LAE, trivial TR   . Chronic kidney disease   . Complication of anesthesia   . Diabetes mellitus Dx 1989  . Hepatitis C Dx 2013  . Hypertension Dx 1989  . Obesity   . Obstructive sleep apnea   . Pancreatitis 2013  . Refusal of blood transfusions as patient is Jehovah's Witness   . Tendinitis   . Ulcer 2010   Past Surgical History:  Procedure Laterality Date  . APPLICATION OF WOUND VAC Left 08/14/2017   Procedure: APPLICATION OF WOUND VAC Exchange;  Surgeon: Robert Bellow, MD;  Location: [facility];  Service: General;  Laterality: Left;  . CHOLECYSTECTOMY    . CORONARY STENT INTERVENTION N/A 09/18/2016   Procedure: Coronary Stent Intervention;  Surgeon: Troy Sine, MD;  Location: [place] [facility];  Service: Cardiovascular;  Laterality: N/A;  . DRESSING CHANGE UNDER ANESTHESIA Left 08/15/2017   Procedure: exploration of wound for bleeding;  Surgeon: Robert Bellow, MD;  Location: [facility];  Service: General;  Laterality: Left;  . EYE SURGERY    . INCISION AND DRAINAGE  ABSCESS Left 08/12/2017   Procedure: INCISION AND DRAINAGE ABSCESS;  Surgeon: Robert Bellow, MD;  Location: [facility];  Service: General;  Laterality: Left;  . KNEE ARTHROSCOPY    . LEFT HEART CATH N/A 09/18/2016   Procedure: Left Heart Cath;  Surgeon: Troy Sine, MD;  Location: [place] [facility];  Service: Cardiovascular;  Laterality: N/A;  . LEFT HEART CATH AND CORONARY ANGIOGRAPHY N/A 09/16/2016   Procedure: Left Heart Cath and Coronary Angiography;  Surgeon:  Burnell Blanks, MD;  Location: [place] [facility];  Service: Cardiovascular;  Laterality: N/A;  . LEFT HEART CATH AND CORONARY ANGIOGRAPHY N/A 04/29/2017   Procedure: LEFT HEART CATH AND CORONARY ANGIOGRAPHY;  Surgeon: Nelva Bush, MD;  Location: [place] [facility];  Service: Cardiovascular;  Laterality: N/A;  . TUBAL LIGATION    . TUBAL LIGATION     Family History  Problem Relation Age of Onset  . Colon cancer Mother   . Heart attack Other   . Heart attack Maternal Grandmother   . Hypertension Sister   . Hypertension Brother   . Diabetes Paternal Grandmother   . Breast cancer Neg Hx    Social History   Tobacco Use  . Smoking status: Former Smoker    Last attempt to quit: 10/25/1980    Years since quitting: 37.0  . Smokeless tobacco: Never Used  . Tobacco comment: quit smoking in 1982  Substance Use Topics  . Alcohol use: Yes    Comment: 3 times in last year   Allergies  Allergen Reactions  . Shellfish Allergy Anaphylaxis and Swelling  . Diazepam Other (See Comments)    "felt like out of body experience"  . Morphine And Related Itching   Prior to Admission medications   Medication Sig Start Date End Date Taking? Authorizing Provider  albuterol (PROVENTIL) (2.5 MG/3ML) 0.083% nebulizer solution Take 3 mLs (2.5 mg total) by nebulization every 6 (six) hours as needed for wheezing or shortness of breath. 09/01/17  Yes [name], PA-C  albuterol (VENTOLIN HFA) 108 (90 Base) MCG/ACT inhaler Inhale 2 puffs into the lungs every 4 (four) hours as needed for wheezing or shortness of breath. 09/01/17  Yes [name], PA-C  allopurinol (ZYLOPRIM) 300 MG tablet Take 1 tablet (300 mg total) by mouth daily. 10/14/17  Yes [name], PA-C  aspirin EC 81 MG EC tablet Take 1 tablet (81 mg total) by mouth daily. 09/19/16  Yes Strader, Hardy, PA-C  atorvastatin (LIPITOR) 80 MG tablet Take 1 tablet (80 mg total) by mouth daily at 6 PM. 09/24/17  Yes  Burnette, Clearnce Sorrel, PA-C  Blood Glucose Monitoring Suppl (ACCU-CHEK AVIVA) device Use as instructed daily. 04/14/17  Yes Charlott Rakes, MD  Blood Pressure Monitoring (ADULT BLOOD PRESSURE CUFF LG) KIT Check blood pressure once daily 10/14/17  Yes Burnette, Jennifer M, PA-C  carvedilol (COREG) 25 MG tablet TAKE 1 TABLET BY MOUTH 2 TIMES DAILY WITH A MEAL. 10/14/17  Yes [name] M, PA-C  docusate sodium (COLACE) 100 MG capsule Take 1 capsule (100 mg total) by mouth 2 (two) times daily as needed for mild constipation. 09/20/17  Yes Gouru, Aruna, MD  Dulaglutide (TRULICITY) 4.74 QV/9.5GL SOPN Inject 0.75 mg into the skin once a week. 11/22/17  Yes [name], PA-C  ferrous sulfate 325 (65 FE) MG tablet Take 1 tablet (325 mg total) by mouth 2 (two) times daily with a meal. 10/14/17  Yes Burnette, Clearnce Sorrel, PA-C  fluticasone (FLONASE) 50 MCG/ACT nasal spray Place 2 sprays into both nostrils daily as needed for allergies or rhinitis. 09/28/17  Yes [name], PA-C  Fluticasone-Salmeterol (ADVAIR DISKUS) 250-50 MCG/DOSE AEPB Inhale 1 puff into the lungs 2 (two) times daily. 09/01/17  Yes [name] M, PA-C  glucose blood (ACCU-CHEK AVIVA) test strip Use as instructed daily 04/14/17  Yes Newlin, Charlane Ferretti, MD  glucose blood (TRUE METRIX BLOOD GLUCOSE TEST) test strip Use as instructed 03/19/17  Yes Barrett, Rhonda G, PA-C  hydrALAZINE (APRESOLINE) 50 MG tablet Take 1 tablet (50 mg total) by mouth every 8 (eight) hours. 10/11/17  Yes Salary, Avel Peace, MD  hydrOXYzine (ATARAX/VISTARIL) 50 MG tablet Take 1 tablet (50 mg total) by mouth 2 (two) times daily as needed. 11/17/17  Yes [name], PA-C  insulin aspart (NOVOLOG) 100 UNIT/ML FlexPen Inject 10 Units into the skin 3 (three) times daily with meals. 11/15/17  Yes [name] M, PA-C  Insulin Glargine (LANTUS SOLOSTAR) 100 UNIT/ML Solostar Pen Inject 48 Units into the skin 2 (two) times daily. 11/04/17  Yes [name], PA-C  Insulin Pen Needle (PEN NEEDLES) 30G X 8 MM MISC 1 each by Does not apply route 3 (three) times daily before meals. AC for novolog injections 11/15/17  Yes [name], PA-C  isosorbide mononitrate (IMDUR) 30 MG 24 hr tablet TAKE 3 TABLETS(90 MG) BY MOUTH DAILY 10/29/17  Yes [name] M, PA-C  ketotifen (ZADITOR) 0.025 % ophthalmic solution Place 1 drop into both eyes daily as needed (for dry eyes). 09/01/17  Yes [name], PA-C  Lancet Devices (ACCU-CHEK [place]) lancets Use as instructed daily. 04/14/17  Yes Charlott Rakes, MD  loratadine (CLARITIN) 10 MG tablet Take 1 tablet (10 mg total) by mouth daily. 09/28/17  Yes [name], PA-C  metolazone (ZAROXOLYN) 2.5 MG tablet Take 1 tablet (2.5 mg total) by mouth daily as needed (swelling). 11/22/17  Yes Burnette, Jennifer M, PA-C  MITIGARE 0.6 MG CAPS Take 1 capsule by mouth daily as needed. 10/14/17  Yes [name] M, PA-C  montelukast (SINGULAIR) 10 MG tablet Take 1 tablet (10 mg total) by mouth at bedtime. 09/28/17  Yes [name], PA-C  nitroGLYCERIN (NITROSTAT) 0.4 MG SL tablet Place 1 tablet (0.4 mg total) under the tongue every 5 (five) minutes x 3 doses as needed for chest pain. 03/19/17  Yes Barrett, Evelene Croon, PA-C  omeprazole (PRILOSEC) 20 MG capsule Take 1 capsule (20 mg total) by mouth daily. 09/24/17  Yes [name] M, PA-C  potassium chloride SA (K-DUR,KLOR-CON) 20 MEQ tablet Take 1 tablet (20 mEq total) by mouth 2 (two) times daily. 11/17/17  Yes [name], PA-C  pregabalin (LYRICA) 300 MG capsule Take 1 capsule (300 mg total) by mouth at bedtime. 11/15/17  Yes [name], PA-C  ticagrelor (BRILINTA) 90 MG TABS tablet Take 1 tablet (90 mg total) by mouth 2 (two) times daily. 10/14/17  Yes [name], PA-C  torsemide (DEMADEX) 20 MG tablet Take 2 tablets (40 mg total) by mouth 2 (two) times daily. 10/11/17  Yes Salary, Avel Peace, MD   valACYclovir (VALTREX) 500 MG tablet Take 1 tablet (500 mg total) by mouth daily. 10/14/17  Yes [name], PA-C  Vitamin D, Ergocalciferol, (DRISDOL) 50000 units CAPS capsule Take 1 capsule (50,000 Units total) by mouth every 30 (thirty) days. 10/14/17  Yes [name], PA-C  gabapentin (NEURONTIN) 100 MG capsule Take 1 capsule (  100 mg total) by mouth 3 (three) times daily. 08/19/17 09/01/17  [name], MD    Review of Systems  Constitutional: Positive for fatigue (tire easily). Negative for appetite change.  HENT: Negative for congestion, postnasal drip and sore throat.   Eyes: Positive for visual disturbance (blurry vision).  Respiratory: Negative for chest tightness and shortness of breath.   Cardiovascular: Positive for leg swelling (better). Negative for chest pain and palpitations.  Gastrointestinal: Negative for abdominal distention and abdominal pain.  Endocrine: Negative.   Genitourinary: Negative.   Musculoskeletal: Negative for arthralgias, back pain and neck pain.  Skin: Negative.   Allergic/Immunologic: Negative.   Neurological: Positive for headaches. Negative for dizziness and light-headedness.  Hematological: Negative for adenopathy. Does not bruise/bleed easily.  Psychiatric/Behavioral: Negative for dysphoric mood and sleep disturbance (sleeping on 2 pillows). The patient is not nervous/anxious.    Vitals:   11/23/17 1448  BP: (!) 142/78  Pulse: 98  Resp: 18  SpO2: 98%  Weight: 299 lb 4 oz (135.7 kg)  Height: _0  (1.727 m)   Wt Readings from Last 3 Encounters:  11/23/17 299 lb 4 oz (135.7 kg)  11/15/17 296 lb (134.3 kg)  10/28/17 288 lb (130.6 kg)   Lab Results  Component Value Date   CREATININE 2.70 (H) 10/11/2017   CREATININE 2.58 (H) 10/10/2017   CREATININE 2.79 (H) 10/09/2017   Physical Exam  Constitutional: She is oriented to person, place, and time. She appears well-developed and well-nourished.  HENT:  Head: Normocephalic and  atraumatic.  Neck: Normal range of motion. Neck supple.  Cardiovascular: Normal rate and regular rhythm.  Pulmonary/Chest: Effort normal. She has no wheezes. She has no rales.  Abdominal: Soft. She exhibits no distension. There is no tenderness.  Musculoskeletal: She exhibits edema (1+ pitting edema in bilateral lower legs). She exhibits no tenderness.  Neurological: She is alert and oriented to person, place, and time.  Skin: Skin is warm and dry.  Psychiatric: She has a normal mood and affect. Her behavior is normal. Thought content normal.  Nursing note and vitals reviewed.  Assessment & Plan:  1: Chronic heart failure with preserved ejection fraction- - NYHA class II - euvolemic today - weighing daily and has says that her home weight runs 294-297 pounds. Reminded to call for an overnight weight gain of >2 pounds or a weekly weight gain of >5 pounds - weight up 3 pounds from last time she was here - not adding salt although did have a hotdog with mustard/ketchup this morning. Reminded to closely follow a 2041m sodium diet - saw cardiology ([place] 08/05/17  - saw pulmonology (Ashby Dawes 10/26/17  2: HTN- - BP ok today - saw PCP (J. Burnette) 11/15/17 - BMP on 10/11/17 reviewed and showed sodium 139, potassium 3.6 and GFR 21  3: Diabetes- - nonfasting glucose in clinic today was 534.  - she says that she ate a hotdog and drank a regular coke earlier today  - sees nephrology (Kolloru)   4: Lymphedema- - stage 2 - edema stable - not elevating her legs and she was instructed to elevate them when sitting for long periods of time along with putting on compression socks - currently limited in exercise due to her fatigue - should edema persist after above therapies, consider lymphapress compression boots  Patient did not bring her medications nor a list. Each medication was verbally reviewed with the patient and she was encouraged to bring the bottles to every visit to confirm  accuracy  of list.  Return in 4 months or sooner for any questions/problems before then.

## 2017-11-22 ENCOUNTER — Other Ambulatory Visit: Payer: Self-pay

## 2017-11-22 DIAGNOSIS — E118 Type 2 diabetes mellitus with unspecified complications: Principal | ICD-10-CM

## 2017-11-22 DIAGNOSIS — E1165 Type 2 diabetes mellitus with hyperglycemia: Secondary | ICD-10-CM

## 2017-11-22 DIAGNOSIS — IMO0002 Reserved for concepts with insufficient information to code with codable children: Secondary | ICD-10-CM

## 2017-11-22 MED ORDER — METOLAZONE 2.5 MG PO TABS: 2.5000 mg | ORAL_TABLET | Freq: Every day | ORAL | 1 refills | Status: DC | PRN

## 2017-11-22 MED ORDER — DULAGLUTIDE 0.75 MG/0.5ML ~~LOC~~ SOAJ: 0.7500 mg | SUBCUTANEOUS | 3 refills | Status: DC

## 2017-11-22 NOTE — Telephone Encounter (Signed)
Patient called requesting refills

## 2017-11-23 ENCOUNTER — Ambulatory Visit: Payer: Medicaid Other | Attending: Family | Admitting: Family

## 2017-11-23 ENCOUNTER — Encounter: Payer: Self-pay | Admitting: Family

## 2017-11-23 VITALS — BP 142/78 | HR 98 | Resp 18 | Ht 68.0 in | Wt 299.2 lb

## 2017-11-23 DIAGNOSIS — I10 Essential (primary) hypertension: Secondary | ICD-10-CM

## 2017-11-23 DIAGNOSIS — B1920 Unspecified viral hepatitis C without hepatic coma: Secondary | ICD-10-CM | POA: Diagnosis not present

## 2017-11-23 DIAGNOSIS — Z955 Presence of coronary angioplasty implant and graft: Secondary | ICD-10-CM | POA: Insufficient documentation

## 2017-11-23 DIAGNOSIS — Z885 Allergy status to narcotic agent status: Secondary | ICD-10-CM | POA: Diagnosis not present

## 2017-11-23 DIAGNOSIS — I350 Nonrheumatic aortic (valve) stenosis: Secondary | ICD-10-CM | POA: Diagnosis not present

## 2017-11-23 DIAGNOSIS — I2510 Atherosclerotic heart disease of native coronary artery without angina pectoris: Secondary | ICD-10-CM | POA: Diagnosis not present

## 2017-11-23 DIAGNOSIS — IMO0002 Reserved for concepts with insufficient information to code with codable children: Secondary | ICD-10-CM

## 2017-11-23 DIAGNOSIS — I890 Lymphedema, not elsewhere classified: Secondary | ICD-10-CM | POA: Insufficient documentation

## 2017-11-23 DIAGNOSIS — E669 Obesity, unspecified: Secondary | ICD-10-CM | POA: Insufficient documentation

## 2017-11-23 DIAGNOSIS — E1122 Type 2 diabetes mellitus with diabetic chronic kidney disease: Secondary | ICD-10-CM | POA: Diagnosis not present

## 2017-11-23 DIAGNOSIS — Z7982 Long term (current) use of aspirin: Secondary | ICD-10-CM | POA: Insufficient documentation

## 2017-11-23 DIAGNOSIS — Z79899 Other long term (current) drug therapy: Secondary | ICD-10-CM | POA: Diagnosis not present

## 2017-11-23 DIAGNOSIS — J45909 Unspecified asthma, uncomplicated: Secondary | ICD-10-CM | POA: Insufficient documentation

## 2017-11-23 DIAGNOSIS — Z87891 Personal history of nicotine dependence: Secondary | ICD-10-CM | POA: Diagnosis not present

## 2017-11-23 DIAGNOSIS — G4733 Obstructive sleep apnea (adult) (pediatric): Secondary | ICD-10-CM | POA: Diagnosis not present

## 2017-11-23 DIAGNOSIS — Z794 Long term (current) use of insulin: Secondary | ICD-10-CM | POA: Insufficient documentation

## 2017-11-23 DIAGNOSIS — I130 Hypertensive heart and chronic kidney disease with heart failure and stage 1 through stage 4 chronic kidney disease, or unspecified chronic kidney disease: Secondary | ICD-10-CM | POA: Insufficient documentation

## 2017-11-23 DIAGNOSIS — E1165 Type 2 diabetes mellitus with hyperglycemia: Secondary | ICD-10-CM

## 2017-11-23 DIAGNOSIS — I5032 Chronic diastolic (congestive) heart failure: Secondary | ICD-10-CM

## 2017-11-23 DIAGNOSIS — N189 Chronic kidney disease, unspecified: Secondary | ICD-10-CM | POA: Diagnosis not present

## 2017-11-23 DIAGNOSIS — E118 Type 2 diabetes mellitus with unspecified complications: Secondary | ICD-10-CM

## 2017-11-23 DIAGNOSIS — Z6841 Body Mass Index (BMI) 40.0 and over, adult: Secondary | ICD-10-CM | POA: Insufficient documentation

## 2017-11-23 DIAGNOSIS — E1136 Type 2 diabetes mellitus with diabetic cataract: Secondary | ICD-10-CM | POA: Diagnosis not present

## 2017-11-23 DIAGNOSIS — I5042 Chronic combined systolic (congestive) and diastolic (congestive) heart failure: Secondary | ICD-10-CM | POA: Insufficient documentation

## 2017-11-23 LAB — GLUCOSE, CAPILLARY: Glucose-Capillary: 534 mg/dL (ref 65–99)

## 2017-11-23 NOTE — Telephone Encounter (Signed)
Pt has still not returned call. Would you like to send letter?

## 2017-11-23 NOTE — Patient Instructions (Signed)
Continue weighing daily and call for an overnight weight gain of > 2 pounds or a weekly weight gain of >5 pounds.  Elevate legs when sitting for long periods of time.

## 2017-11-24 ENCOUNTER — Encounter: Payer: Self-pay | Admitting: *Deleted

## 2017-11-24 ENCOUNTER — Telehealth: Payer: Self-pay | Admitting: Physician Assistant

## 2017-11-24 NOTE — Telephone Encounter (Signed)
Received medical clearance form from [facility], placed form in providers box. Thanks CC

## 2017-11-24 NOTE — Pre-Procedure Instructions (Signed)
BS 11/23/17 IN HEART CLINIC WAS 534. NOTIFIED CINDY AT [place] . THEY WILL INITATE.

## 2017-11-25 NOTE — Telephone Encounter (Signed)
appt 11/26/17 11am

## 2017-11-25 NOTE — Telephone Encounter (Signed)
Note given to [facility] to call patient to schedule appt. Will need to optimize sugar prior to surgery.

## 2017-11-26 ENCOUNTER — Encounter: Payer: Self-pay | Admitting: Physician Assistant

## 2017-11-26 ENCOUNTER — Ambulatory Visit (INDEPENDENT_AMBULATORY_CARE_PROVIDER_SITE_OTHER): Payer: Medicaid Other | Admitting: Physician Assistant

## 2017-11-26 VITALS — BP 130/80 | HR 88 | Temp 98.0°F | Resp 16 | Wt 303.0 lb

## 2017-11-26 DIAGNOSIS — E1165 Type 2 diabetes mellitus with hyperglycemia: Secondary | ICD-10-CM

## 2017-11-26 DIAGNOSIS — E118 Type 2 diabetes mellitus with unspecified complications: Secondary | ICD-10-CM | POA: Diagnosis not present

## 2017-11-26 DIAGNOSIS — R251 Tremor, unspecified: Secondary | ICD-10-CM

## 2017-11-26 DIAGNOSIS — IMO0002 Reserved for concepts with insufficient information to code with codable children: Secondary | ICD-10-CM

## 2017-11-26 LAB — GLUCOSE, POCT (MANUAL RESULT ENTRY): POC Glucose: 455 mg/dl — AB (ref 70–99)

## 2017-11-26 LAB — POCT GLYCOSYLATED HEMOGLOBIN (HGB A1C)
Est. average glucose Bld gHb Est-mCnc: 258
Hemoglobin A1C: 10.6

## 2017-11-26 NOTE — Patient Instructions (Addendum)
Take an extra metolazone today and tomorrow if weight remains over 297 pounds.  Start trulicity [employer]. Take second pen next Tuesday.  May get Biotene for dry mouth.  I will see you back in one week.

## 2017-11-26 NOTE — Progress Notes (Addendum)
Patient: [name] Female    DOB: 05/10/1956   62 y.o.   MRN: [id number] Visit Date: 11/26/2017  Today's Provider: [name], PA-C   Chief Complaint  Patient presents with  . Diabetes   Subjective:    HPI  Patient here today to be cleared for cataract surgery with Dr. Michelene Heady. Was seen by Darylene Price, FNP and was found to have a random glucose of 534 on 11/23/17. She was scheduled to have cataract removal on 12/01/17 but clearance was requested due to elevated BS. Patient had admitted to eating a hot dog and drinking a coke that day as well. Today she also admits to not taking her trulicity nor her Novolog midday. She does state she takes her Lantus as prescribed BID and takes Novolog most mornings and most evenings.    Diabetes Mellitus Type II, Follow-up:   Lab Results  Component Value Date   HGBA1C 10.6 11/26/2017   HGBA1C 9.1 (H) 08/11/2017   HGBA1C 9.2 05/28/2017    Last seen for diabetes 3 months ago.  Management since then includes medication adjustments. She reports excellent compliance with treatment. She is not having side effects.  Current symptoms include paresthesia of the feet and have been unchanged. Home blood sugar records: fasting range: 300's  Episodes of hypoglycemia? no   Current Insulin Regimen: per patient compliant Most Recent Eye Exam: UTD Weight trend: stable Prior visit with dietician: no Current diet: in general, an "unhealthy" diet Current exercise: none  Pertinent Labs:    Component Value Date/Time   CHOL 122 09/13/2016 1245   TRIG 168 (H) 09/13/2016 1245   HDL 36 (L) 09/13/2016 1245   LDLCALC 52 09/13/2016 1245   CREATININE 2.70 (H) 10/11/2017 0424   CREATININE 1.36 (H) 09/23/2016 1451    Wt Readings from Last 3 Encounters:  11/26/17 (!) 303 lb (137.4 kg)  11/23/17 299 lb 4 oz (135.7 kg)  11/15/17 296 lb (134.3 kg)   ------------------------------------------------------------------------       Allergies  Allergen Reactions  . Shellfish Allergy Anaphylaxis and Swelling  . Diazepam Other (See Comments)    "felt like out of body experience"  . Morphine And Related Itching     Current Outpatient Medications:  .  albuterol (PROVENTIL) (2.5 MG/3ML) 0.083% nebulizer solution, Take 3 mLs (2.5 mg total) by nebulization every 6 (six) hours as needed for wheezing or shortness of breath., Disp: 150 mL, Rfl: 1 .  albuterol (VENTOLIN HFA) 108 (90 Base) MCG/ACT inhaler, Inhale 2 puffs into the lungs every 4 (four) hours as needed for wheezing or shortness of breath., Disp: 54 g, Rfl: 3 .  allopurinol (ZYLOPRIM) 300 MG tablet, Take 1 tablet (300 mg total) by mouth daily., Disp: 90 tablet, Rfl: 1 .  aspirin EC 81 MG EC tablet, Take 1 tablet (81 mg total) by mouth daily., Disp: , Rfl:  .  atorvastatin (LIPITOR) 80 MG tablet, Take 1 tablet (80 mg total) by mouth daily at 6 PM., Disp: 90 tablet, Rfl: 3 .  Blood Glucose Monitoring Suppl (ACCU-CHEK AVIVA) device, Use as instructed daily., Disp: 1 each, Rfl: 0 .  Blood Pressure Monitoring (ADULT BLOOD PRESSURE CUFF LG) KIT, Check blood pressure once daily, Disp: 1 each, Rfl: 0 .  carvedilol (COREG) 25 MG tablet, TAKE 1 TABLET BY MOUTH 2 TIMES DAILY WITH A MEAL. (Patient taking differently: Take 25 mg by mouth 2 (two) times daily with a meal. ), Disp: 180 tablet, Rfl:  3 .  docusate sodium (COLACE) 100 MG capsule, Take 1 capsule (100 mg total) by mouth 2 (two) times daily as needed for mild constipation., Disp: 30 capsule, Rfl: 0 .  Dulaglutide (TRULICITY) 7.98 XQ/1.1HE SOPN, Inject 0.75 mg into the skin once a week. (Patient taking differently: Inject 0.75 mg into the skin every Tuesday. ), Disp: 4 pen, Rfl: 3 .  ferrous sulfate 325 (65 FE) MG tablet, Take 1 tablet (325 mg total) by mouth 2 (two) times daily with a meal., Disp: 180 tablet, Rfl: 3 .  fluticasone (FLONASE) 50 MCG/ACT nasal spray, Place 2 sprays into both nostrils daily as needed for  allergies or rhinitis., Disp: 48 g, Rfl: 1 .  Fluticasone-Salmeterol (ADVAIR DISKUS) 250-50 MCG/DOSE AEPB, Inhale 1 puff into the lungs 2 (two) times daily., Disp: 180 each, Rfl: 3 .  glucose blood (TRUE METRIX BLOOD GLUCOSE TEST) test strip, Use as instructed, Disp: 100 each, Rfl: 12 .  hydrALAZINE (APRESOLINE) 50 MG tablet, Take 1 tablet (50 mg total) by mouth every 8 (eight) hours., Disp: 90 tablet, Rfl: 0 .  hydrOXYzine (ATARAX/VISTARIL) 50 MG tablet, Take 1 tablet (50 mg total) by mouth 2 (two) times daily as needed. (Patient taking differently: Take 50 mg by mouth 3 (three) times daily. ), Disp: 60 tablet, Rfl: 0 .  insulin aspart (NOVOLOG) 100 UNIT/ML FlexPen, Inject 10 Units into the skin 3 (three) times daily with meals., Disp: 15 mL, Rfl: 3 .  Insulin Glargine (LANTUS SOLOSTAR) 100 UNIT/ML Solostar Pen, Inject 48 Units into the skin 2 (two) times daily., Disp: 24 mL, Rfl: 5 .  Insulin Pen Needle (PEN NEEDLES) 30G X 8 MM MISC, 1 each by Does not apply route 3 (three) times daily before meals. AC for novolog injections, Disp: 100 each, Rfl: 11 .  isosorbide mononitrate (IMDUR) 30 MG 24 hr tablet, TAKE 3 TABLETS(90 MG) BY MOUTH DAILY, Disp: 270 tablet, Rfl: 1 .  ketotifen (ZADITOR) 0.025 % ophthalmic solution, Place 1 drop into both eyes daily as needed (for dry eyes)., Disp: 10 mL, Rfl: 2 .  Lancet Devices (ACCU-CHEK SOFTCLIX) lancets, Use as instructed daily., Disp: 1 each, Rfl: 5 .  loratadine (CLARITIN) 10 MG tablet, Take 1 tablet (10 mg total) by mouth daily., Disp: 90 tablet, Rfl: 1 .  metolazone (ZAROXOLYN) 2.5 MG tablet, Take 1 tablet (2.5 mg total) by mouth daily as needed (swelling)., Disp: 90 tablet, Rfl: 1 .  MITIGARE 0.6 MG CAPS, Take 1 capsule by mouth daily as needed. (Patient taking differently: Take 1 capsule by mouth daily as needed (for gout). ), Disp: 90 capsule, Rfl: 1 .  montelukast (SINGULAIR) 10 MG tablet, Take 1 tablet (10 mg total) by mouth at bedtime., Disp: 90 tablet,  Rfl: 1 .  nitroGLYCERIN (NITROSTAT) 0.4 MG SL tablet, Place 1 tablet (0.4 mg total) under the tongue every 5 (five) minutes x 3 doses as needed for chest pain., Disp: 25 tablet, Rfl: 12 .  omeprazole (PRILOSEC) 20 MG capsule, Take 1 capsule (20 mg total) by mouth daily., Disp: 90 capsule, Rfl: 3 .  potassium chloride SA (K-DUR,KLOR-CON) 20 MEQ tablet, Take 1 tablet (20 mEq total) by mouth 2 (two) times daily., Disp: 60 tablet, Rfl: 5 .  pregabalin (LYRICA) 300 MG capsule, Take 1 capsule (300 mg total) by mouth at bedtime., Disp: 90 capsule, Rfl: 1 .  ticagrelor (BRILINTA) 90 MG TABS tablet, Take 1 tablet (90 mg total) by mouth 2 (two) times daily., Disp: 180 tablet, Rfl:  3 .  torsemide (DEMADEX) 20 MG tablet, Take 2 tablets (40 mg total) by mouth 2 (two) times daily., Disp: 120 tablet, Rfl: 0 .  valACYclovir (VALTREX) 500 MG tablet, Take 1 tablet (500 mg total) by mouth daily., Disp: 90 tablet, Rfl: 3 .  Vitamin D, Ergocalciferol, (DRISDOL) 50000 units CAPS capsule, Take 1 capsule (50,000 Units total) by mouth every 30 (thirty) days., Disp: 4 capsule, Rfl: 1 .  glucose blood (ACCU-CHEK AVIVA) test strip, Use as instructed daily (Patient not taking: Reported on 11/24/2017), Disp: 100 each, Rfl: 12  Review of Systems  Constitutional: Positive for activity change.  HENT: Negative.   Respiratory: Positive for shortness of breath.   Cardiovascular: Positive for leg swelling.  Genitourinary: Negative.   Neurological: Positive for dizziness, tremors and weakness.    Social History   Tobacco Use  . Smoking status: Former Smoker    Last attempt to quit: 10/25/1980    Years since quitting: 37.1  . Smokeless tobacco: Never Used  . Tobacco comment: quit smoking in 1982  Substance Use Topics  . Alcohol use: Yes    Comment: 3 times in last year   Objective:   BP 130/80 (BP Location: Left Arm, Patient Position: Sitting, Cuff Size: Large)   Pulse 88   Temp 98 F (36.7 C) (Oral)   Resp 16   Wt (!)  303 lb (137.4 kg)   SpO2 97%   BMI 46.07 kg/m  Vitals:   11/26/17 1450  BP: 130/80  Pulse: 88  Resp: 16  Temp: 98 F (36.7 C)  TempSrc: Oral  SpO2: 97%  Weight: (!) 303 lb (137.4 kg)     Physical Exam  Constitutional: She appears well-developed and well-nourished. No distress.  HENT:  Edema noted of face around eyes  Neck: Normal range of motion. Neck supple. No JVD present. No tracheal deviation present. No thyromegaly present.  Cardiovascular: Normal rate and regular rhythm. Exam reveals no gallop and no friction rub.  Murmur heard. Pulmonary/Chest: Effort normal. No respiratory distress. She has no wheezes. She has rales (throughout).  Musculoskeletal: She exhibits edema (1+-2+ pitting edema).  Lymphadenopathy:    She has no cervical adenopathy.  Skin: She is not diaphoretic.  Vitals reviewed. Falls asleep while talking to her.     Assessment & Plan:     1. Diabetes mellitus type 2, uncontrolled, with complications (HCC) E5U today up from 9.1 to 10.6. Random glucose today was 455. Patient not cleared for cataract surgery. Will refer to endocrinology since having a hard time controlling BS despite basal and bolus insulin as well as a GLP1. Will see her back in 1 week to check sugar and see if she can be cleared for cataract surgery.  - POCT glycosylated hemoglobin (Hb A1C) - POCT glucose (manual entry) - Ambulatory referral to Endocrinology  2. Tremor of right hand Occurs at rest and with intention. Worsening rapidly. Will refer to neurology for further testing and evaluation. - Ambulatory referral to Neurology       [name], PA-C  [facility]

## 2017-11-29 ENCOUNTER — Telehealth: Payer: Self-pay | Admitting: Physician Assistant

## 2017-11-29 NOTE — Telephone Encounter (Signed)
Called the patient, and spoke with her daughter. She reports that the patient is beginning to have tremors really bad. She reports that all of her symptoms started last night. Her fasting blood sugar is 114 this morning. Per [name], she recommended that patient eat breakfast and check blood sugars in about 27mins and let us know what they are.   Patient's blood sugars were averaging in the 450's prior to today. She was just started back on her insulin on Friday (3 days ago). Patient's daughter also mentioned that her weight is down to 295. She denies any sweats, nausea, chest pain, or blurred vision.

## 2017-11-29 NOTE — Telephone Encounter (Signed)
Patient called back saying that her blood sugar is now 294. She reports that she feels a little better, but she is still having the bad tremors. She wanted to know if she should take her insulin now? Please advise. Thanks!

## 2017-11-29 NOTE — Telephone Encounter (Signed)
[name] with [facility] [place], stated that she contacted pt this morning just to check in and stated pt advised she isn't feeling well and feels like something is wrong. Blood sugar 114. [name] said pt might be weak and dehydrated. [name] stated that she advised pt to contact our office to be triaged and pt stated that her daughter was getting pt something to eat and she would call [name] back after she ate and hung up. [name] stated she wanted to let our office know and suggested someone might need to contact pt to triage. Please advise. Thanks TNP

## 2017-11-29 NOTE — Telephone Encounter (Signed)
Yes ok to take insulin now.

## 2017-11-29 NOTE — Telephone Encounter (Signed)
Advised patient as below.  

## 2017-11-30 ENCOUNTER — Telehealth: Payer: Self-pay | Admitting: Physician Assistant

## 2017-11-30 NOTE — Telephone Encounter (Signed)
Shayla will refax.

## 2017-11-30 NOTE — Telephone Encounter (Signed)
Shayla with [facility] is requesting call back for status update on orders that were faxed on 11/10/17. Please advise. Thanks TNP

## 2017-12-01 ENCOUNTER — Ambulatory Visit: Admission: RE | Admit: 2017-12-01 | Payer: Medicaid Other | Source: Ambulatory Visit | Admitting: Ophthalmology

## 2017-12-01 ENCOUNTER — Encounter: Admission: RE | Payer: Self-pay | Source: Ambulatory Visit

## 2017-12-01 HISTORY — DX: Gout, unspecified: M10.9

## 2017-12-01 HISTORY — DX: Tremor, unspecified: R25.1

## 2017-12-01 HISTORY — DX: Depression, unspecified: F32.A

## 2017-12-01 HISTORY — DX: Major depressive disorder, single episode, unspecified: F32.9

## 2017-12-01 HISTORY — DX: Gastro-esophageal reflux disease without esophagitis: K21.9

## 2017-12-01 HISTORY — DX: Anemia, unspecified: D64.9

## 2017-12-01 HISTORY — DX: Unspecified osteoarthritis, unspecified site: M19.90

## 2017-12-01 SURGERY — PHACOEMULSIFICATION, CATARACT, WITH IOL INSERTION
Anesthesia: Choice | Laterality: Right

## 2017-12-03 ENCOUNTER — Ambulatory Visit: Payer: Medicaid Other | Admitting: Physician Assistant

## 2017-12-03 ENCOUNTER — Encounter: Payer: Self-pay | Admitting: Physician Assistant

## 2017-12-03 VITALS — BP 130/60 | HR 97 | Temp 98.5°F | Resp 20 | Wt 298.0 lb

## 2017-12-03 DIAGNOSIS — E118 Type 2 diabetes mellitus with unspecified complications: Secondary | ICD-10-CM | POA: Diagnosis not present

## 2017-12-03 DIAGNOSIS — E1165 Type 2 diabetes mellitus with hyperglycemia: Secondary | ICD-10-CM | POA: Diagnosis not present

## 2017-12-03 DIAGNOSIS — H8113 Benign paroxysmal vertigo, bilateral: Secondary | ICD-10-CM | POA: Diagnosis not present

## 2017-12-03 DIAGNOSIS — IMO0002 Reserved for concepts with insufficient information to code with codable children: Secondary | ICD-10-CM

## 2017-12-03 MED ORDER — MECLIZINE HCL 25 MG PO TABS: 25.0000 mg | ORAL_TABLET | Freq: Three times a day (TID) | ORAL | 3 refills | Status: DC | PRN

## 2017-12-03 NOTE — Patient Instructions (Signed)
How to Perform the Epley Maneuver The Epley maneuver is an exercise that relieves symptoms of vertigo. Vertigo is the feeling that you or your surroundings are moving when they are not. When you feel vertigo, you may feel like the room is spinning and have trouble walking. Dizziness is a little different than vertigo. When you are dizzy, you may feel unsteady or light-headed. You can do this maneuver at home whenever you have symptoms of vertigo. You can do it up to 3 times a day until your symptoms go away. Even though the Epley maneuver may relieve your vertigo for a few weeks, it is possible that your symptoms will return. This maneuver relieves vertigo, but it does not relieve dizziness. What are the risks? If it is done correctly, the Epley maneuver is considered safe. Sometimes it can lead to dizziness or nausea that goes away after a short time. If you develop other symptoms, such as changes in vision, weakness, or numbness, stop doing the maneuver and call your health care provider. How to perform the Epley maneuver 1. Sit on the edge of a bed or table with your back straight and your legs extended or hanging over the edge of the bed or table. 2. Turn your head halfway toward the affected ear or side. 3. Lie backward quickly with your head turned until you are lying flat on your back. You may want to position a pillow under your shoulders. 4. Hold this position for 30 seconds. You may experience an attack of vertigo. This is normal. 5. Turn your head to the opposite direction until your unaffected ear is facing the floor. 6. Hold this position for 30 seconds. You may experience an attack of vertigo. This is normal. Hold this position until the vertigo stops. 7. Turn your whole body to the same side as your head. Hold for another 30 seconds. 8. Sit back up. You can repeat this exercise up to 3 times a day. Follow these instructions at home:  After doing the Epley maneuver, you can return to  your normal activities.  Ask your health care provider if there is anything you should do at home to prevent vertigo. He or she may recommend that you: ? Keep your head raised (elevated) with two or more pillows while you sleep. ? Do not sleep on the side of your affected ear. ? Get up slowly from bed. ? Avoid sudden movements during the day. ? Avoid extreme head movement, like looking up or bending over. Contact a health care provider if:  Your vertigo gets worse.  You have other symptoms, including: ? Nausea. ? Vomiting. ? Headache. Get help right away if:  You have vision changes.  You have a severe or worsening headache or neck pain.  You cannot stop vomiting.  You have new numbness or weakness in any part of your body. Summary  Vertigo is the feeling that you or your surroundings are moving when they are not.  The Epley maneuver is an exercise that relieves symptoms of vertigo.  If the Epley maneuver is done correctly, it is considered safe. You can do it up to 3 times a day. This information is not intended to replace advice given to you by your health care provider. Make sure you discuss any questions you have with your health care provider. Document Released: 07/04/2013 Document Revised: 05/19/2016 Document Reviewed: 05/19/2016 Elsevier Interactive Patient Education  2017 [employer]  Benign Positional Vertigo Vertigo is the feeling that you or   your surroundings are moving when they are not. Benign positional vertigo is the most common form of vertigo. The cause of this condition is not serious (is benign). This condition is triggered by certain movements and positions (is positional). This condition can be dangerous if it occurs while you are doing something that could endanger you or others, such as driving. What are the causes? In many cases, the cause of this condition is not known. It may be caused by a disturbance in an area of the inner ear that helps your  brain to sense movement and balance. This disturbance can be caused by a viral infection (labyrinthitis), head injury, or repetitive motion. What increases the risk? This condition is more likely to develop in:  Women.  People who are 50 years of age or older.  What are the signs or symptoms? Symptoms of this condition usually happen when you move your head or your eyes in different directions. Symptoms may start suddenly, and they usually last for less than a minute. Symptoms may include:  Loss of balance and falling.  Feeling like you are spinning or moving.  Feeling like your surroundings are spinning or moving.  Nausea and vomiting.  Blurred vision.  Dizziness.  Involuntary eye movement (nystagmus).  Symptoms can be mild and cause only slight annoyance, or they can be severe and interfere with daily life. Episodes of benign positional vertigo may return (recur) over time, and they may be triggered by certain movements. Symptoms may improve over time. How is this diagnosed? This condition is usually diagnosed by medical history and a physical exam of the head, neck, and ears. You may be referred to a health care provider who specializes in ear, nose, and throat (ENT) problems (otolaryngologist) or a provider who specializes in disorders of the nervous system (neurologist). You may have additional testing, including:  MRI.  A CT scan.  Eye movement tests. Your health care provider may ask you to change positions quickly while he or she watches you for symptoms of benign positional vertigo, such as nystagmus. Eye movement may be tested with an electronystagmogram (ENG), caloric stimulation, the Dix-Hallpike test, or the roll test.  An electroencephalogram (EEG). This records electrical activity in your brain.  Hearing tests.  How is this treated? Usually, your health care provider will treat this by moving your head in specific positions to adjust your inner ear back to  normal. Surgery may be needed in severe cases, but this is rare. In some cases, benign positional vertigo may resolve on its own in 2-4 weeks. Follow these instructions at home: Safety  Move slowly.Avoid sudden body or head movements.  Avoid driving.  Avoid operating heavy machinery.  Avoid doing any tasks that would be dangerous to you or others if a vertigo episode would occur.  If you have trouble walking or keeping your balance, try using a cane for stability. If you feel dizzy or unstable, sit down right away.  Return to your normal activities as told by your health care provider. Ask your health care provider what activities are safe for you. General instructions  Take over-the-counter and prescription medicines only as told by your health care provider.  Avoid certain positions or movements as told by your health care provider.  Drink enough fluid to keep your urine clear or pale yellow.  Keep all follow-up visits as told by your health care provider. This is important. Contact a health care provider if:  You have a fever.  Your   condition gets worse or you develop new symptoms.  Your family or friends notice any behavioral changes.  Your nausea or vomiting gets worse.  You have numbness or a "pins and needles" sensation. Get help right away if:  You have difficulty speaking or moving.  You are always dizzy.  You faint.  You develop severe headaches.  You have weakness in your legs or arms.  You have changes in your hearing or vision.  You develop a stiff neck.  You develop sensitivity to light. This information is not intended to replace advice given to you by your health care provider. Make sure you discuss any questions you have with your health care provider. Document Released: 04/06/2006 Document Revised: 12/05/2015 Document Reviewed: 10/22/2014 Elsevier Interactive Patient Education  2018 [employer] Inc.  

## 2017-12-03 NOTE — Progress Notes (Signed)
Patient: [name] Female    DOB: 10/29/1955   62 y.o.   MRN: [id number] Visit Date: 12/03/2017  Today's Provider: [name], PA-C   Chief Complaint  Patient presents with  . Follow-up   Subjective:    HPI Patient here today to follow up from last office visit 11/26/17. Patient was not cleared for cataract surgery and was referred to endocrinology. Patient reports good tolerance and compliance with medications. Patient reports fbs was 173 this morning.   She continues to have tremors of the right hand with intention as well. She has been referred to Neurology for this.      Allergies  Allergen Reactions  . Shellfish Allergy Anaphylaxis and Swelling  . Diazepam Other (See Comments)    "felt like out of body experience"  . Morphine And Related Itching     Current Outpatient Medications:  .  albuterol (PROVENTIL) (2.5 MG/3ML) 0.083% nebulizer solution, Take 3 mLs (2.5 mg total) by nebulization every 6 (six) hours as needed for wheezing or shortness of breath., Disp: 150 mL, Rfl: 1 .  albuterol (VENTOLIN HFA) 108 (90 Base) MCG/ACT inhaler, Inhale 2 puffs into the lungs every 4 (four) hours as needed for wheezing or shortness of breath., Disp: 54 g, Rfl: 3 .  allopurinol (ZYLOPRIM) 300 MG tablet, Take 1 tablet (300 mg total) by mouth daily., Disp: 90 tablet, Rfl: 1 .  aspirin EC 81 MG EC tablet, Take 1 tablet (81 mg total) by mouth daily., Disp: , Rfl:  .  atorvastatin (LIPITOR) 80 MG tablet, Take 1 tablet (80 mg total) by mouth daily at 6 PM., Disp: 90 tablet, Rfl: 3 .  Blood Glucose Monitoring Suppl (ACCU-CHEK AVIVA) device, Use as instructed daily., Disp: 1 each, Rfl: 0 .  Blood Pressure Monitoring (ADULT BLOOD PRESSURE CUFF LG) KIT, Check blood pressure once daily, Disp: 1 each, Rfl: 0 .  carvedilol (COREG) 25 MG tablet, TAKE 1 TABLET BY MOUTH 2 TIMES DAILY WITH A MEAL. (Patient taking differently: Take 25 mg by mouth 2 (two) times daily with a meal. ), Disp:  180 tablet, Rfl: 3 .  docusate sodium (COLACE) 100 MG capsule, Take 1 capsule (100 mg total) by mouth 2 (two) times daily as needed for mild constipation., Disp: 30 capsule, Rfl: 0 .  Dulaglutide (TRULICITY) 9.52 WU/1.3KG SOPN, Inject 0.75 mg into the skin once a week. (Patient taking differently: Inject 0.75 mg into the skin every Tuesday. ), Disp: 4 pen, Rfl: 3 .  ferrous sulfate 325 (65 FE) MG tablet, Take 1 tablet (325 mg total) by mouth 2 (two) times daily with a meal., Disp: 180 tablet, Rfl: 3 .  fluticasone (FLONASE) 50 MCG/ACT nasal spray, Place 2 sprays into both nostrils daily as needed for allergies or rhinitis., Disp: 48 g, Rfl: 1 .  Fluticasone-Salmeterol (ADVAIR DISKUS) 250-50 MCG/DOSE AEPB, Inhale 1 puff into the lungs 2 (two) times daily., Disp: 180 each, Rfl: 3 .  glucose blood (ACCU-CHEK AVIVA) test strip, Use as instructed daily, Disp: 100 each, Rfl: 12 .  glucose blood (TRUE METRIX BLOOD GLUCOSE TEST) test strip, Use as instructed, Disp: 100 each, Rfl: 12 .  hydrALAZINE (APRESOLINE) 50 MG tablet, Take 1 tablet (50 mg total) by mouth every 8 (eight) hours., Disp: 90 tablet, Rfl: 0 .  hydrOXYzine (ATARAX/VISTARIL) 50 MG tablet, Take 1 tablet (50 mg total) by mouth 2 (two) times daily as needed. (Patient taking differently: Take 50 mg by mouth 3 (  three) times daily. ), Disp: 60 tablet, Rfl: 0 .  insulin aspart (NOVOLOG) 100 UNIT/ML FlexPen, Inject 10 Units into the skin 3 (three) times daily with meals., Disp: 15 mL, Rfl: 3 .  Insulin Glargine (LANTUS SOLOSTAR) 100 UNIT/ML Solostar Pen, Inject 48 Units into the skin 2 (two) times daily., Disp: 24 mL, Rfl: 5 .  Insulin Pen Needle (PEN NEEDLES) 30G X 8 MM MISC, 1 each by Does not apply route 3 (three) times daily before meals. AC for novolog injections, Disp: 100 each, Rfl: 11 .  isosorbide mononitrate (IMDUR) 30 MG 24 hr tablet, TAKE 3 TABLETS(90 MG) BY MOUTH DAILY, Disp: 270 tablet, Rfl: 1 .  ketotifen (ZADITOR) 0.025 % ophthalmic  solution, Place 1 drop into both eyes daily as needed (for dry eyes)., Disp: 10 mL, Rfl: 2 .  Lancet Devices (ACCU-CHEK SOFTCLIX) lancets, Use as instructed daily., Disp: 1 each, Rfl: 5 .  loratadine (CLARITIN) 10 MG tablet, Take 1 tablet (10 mg total) by mouth daily., Disp: 90 tablet, Rfl: 1 .  metolazone (ZAROXOLYN) 2.5 MG tablet, Take 1 tablet (2.5 mg total) by mouth daily as needed (swelling)., Disp: 90 tablet, Rfl: 1 .  MITIGARE 0.6 MG CAPS, Take 1 capsule by mouth daily as needed. (Patient taking differently: Take 1 capsule by mouth daily as needed (for gout). ), Disp: 90 capsule, Rfl: 1 .  montelukast (SINGULAIR) 10 MG tablet, Take 1 tablet (10 mg total) by mouth at bedtime., Disp: 90 tablet, Rfl: 1 .  nitroGLYCERIN (NITROSTAT) 0.4 MG SL tablet, Place 1 tablet (0.4 mg total) under the tongue every 5 (five) minutes x 3 doses as needed for chest pain., Disp: 25 tablet, Rfl: 12 .  omeprazole (PRILOSEC) 20 MG capsule, Take 1 capsule (20 mg total) by mouth daily., Disp: 90 capsule, Rfl: 3 .  potassium chloride SA (K-DUR,KLOR-CON) 20 MEQ tablet, Take 1 tablet (20 mEq total) by mouth 2 (two) times daily., Disp: 60 tablet, Rfl: 5 .  pregabalin (LYRICA) 300 MG capsule, Take 1 capsule (300 mg total) by mouth at bedtime., Disp: 90 capsule, Rfl: 1 .  ticagrelor (BRILINTA) 90 MG TABS tablet, Take 1 tablet (90 mg total) by mouth 2 (two) times daily., Disp: 180 tablet, Rfl: 3 .  torsemide (DEMADEX) 20 MG tablet, Take 2 tablets (40 mg total) by mouth 2 (two) times daily., Disp: 120 tablet, Rfl: 0 .  valACYclovir (VALTREX) 500 MG tablet, Take 1 tablet (500 mg total) by mouth daily., Disp: 90 tablet, Rfl: 3 .  Vitamin D, Ergocalciferol, (DRISDOL) 50000 units CAPS capsule, Take 1 capsule (50,000 Units total) by mouth every 30 (thirty) days., Disp: 4 capsule, Rfl: 1  Review of Systems  Constitutional: Positive for activity change, chills and fatigue.  Neurological: Positive for dizziness, tremors and weakness.      Social History   Tobacco Use  . Smoking status: Former Smoker    Last attempt to quit: 10/25/1980    Years since quitting: 37.1  . Smokeless tobacco: Never Used  . Tobacco comment: quit smoking in 1982  Substance Use Topics  . Alcohol use: Yes    Comment: 3 times in last year   Objective:   BP 130/60 (BP Location: Left Arm, Patient Position: Sitting, Cuff Size: Large)   Pulse 97   Temp 98.5 F (36.9 C) (Oral)   Resp 20   Wt 298 lb (135.2 kg)   SpO2 97%   BMI 45.31 kg/m  Vitals:   12/03/17 1601  BP: 130/60  Pulse: 97  Resp: 20  Temp: 98.5 F (36.9 C)  TempSrc: Oral  SpO2: 97%  Weight: 298 lb (135.2 kg)     Physical Exam  Constitutional: She appears well-developed and well-nourished. No distress.  Neck: Normal range of motion. Neck supple. No JVD present. No tracheal deviation present. No thyromegaly present.  Cardiovascular: Normal rate and regular rhythm. Exam reveals no gallop and no friction rub.  Murmur heard. Pulmonary/Chest: Effort normal and breath sounds normal. No respiratory distress. She has no wheezes. She has no rales.  Musculoskeletal: She exhibits edema (2+ pitting edema).  Lymphadenopathy:    She has no cervical adenopathy.  Skin: She is not diaphoretic.  Vitals reviewed.      Assessment & Plan:     1. Diabetes mellitus type 2, uncontrolled, with complications (HCC) Q9V was 10.7, random glucose today was 339. Levels are decreasing some but still not to goal. Continue Metformin, Trulicity, Lantus and Novolog for now. I will see her back in 2 weeks for recheck.  - POCT HgB A1C - POCT Glucose (CBG)  2. Benign paroxysmal positional vertigo due to bilateral vestibular disorder Having some recurrence of her vertigo. Occurs most often with change of position (particularly lying flat) and with turning her head. Will Give meclizine as below. Discussed drowsiness precautions. She voices understanding.  - meclizine (ANTIVERT) 25 MG tablet; Take 1  tablet (25 mg total) by mouth 3 (three) times daily as needed for dizziness.  Dispense: 30 tablet; Refill: [place], PA-C  [facility]

## 2017-12-08 ENCOUNTER — Other Ambulatory Visit: Payer: Self-pay | Admitting: Physician Assistant

## 2017-12-08 DIAGNOSIS — E559 Vitamin D deficiency, unspecified: Secondary | ICD-10-CM

## 2017-12-08 LAB — GLUCOSE, POCT (MANUAL RESULT ENTRY): POC Glucose: 339 mg/dl — AB (ref 70–99)

## 2017-12-08 LAB — POCT GLYCOSYLATED HEMOGLOBIN (HGB A1C): Hemoglobin A1C: 10.7 % — AB (ref 4.0–5.6)

## 2017-12-09 ENCOUNTER — Telehealth: Payer: Self-pay | Admitting: Physician Assistant

## 2017-12-09 DIAGNOSIS — H8113 Benign paroxysmal vertigo, bilateral: Secondary | ICD-10-CM

## 2017-12-09 NOTE — Telephone Encounter (Signed)
Patient called stating that we need to prior-authorize Trulicity for her and to call in her Meclizine to [facility].

## 2017-12-10 MED ORDER — MECLIZINE HCL 25 MG PO TABS: 25.0000 mg | ORAL_TABLET | Freq: Three times a day (TID) | ORAL | 3 refills | Status: DC | PRN

## 2017-12-10 NOTE — Telephone Encounter (Signed)
I had faxed PA of the 16th and received a confirmation. I called Pharmacy PA call center yesterday and they report they had not received the fax. PA was done over phone yesterday so result pending for Trulicity.

## 2017-12-10 NOTE — Telephone Encounter (Signed)
Meclizine sent.

## 2017-12-14 ENCOUNTER — Other Ambulatory Visit: Payer: Self-pay

## 2017-12-14 ENCOUNTER — Other Ambulatory Visit
Admission: RE | Admit: 2017-12-14 | Discharge: 2017-12-14 | Disposition: A | Discharge status: Home / Self Care | Payer: Medicaid Other | Source: Ambulatory Visit | Attending: Gastroenterology | Admitting: Gastroenterology

## 2017-12-14 ENCOUNTER — Ambulatory Visit: Payer: Medicaid Other | Admitting: Gastroenterology

## 2017-12-14 ENCOUNTER — Encounter: Payer: Self-pay | Admitting: Gastroenterology

## 2017-12-14 VITALS — BP 115/70 | HR 86 | Resp 18 | Ht 68.0 in | Wt 296.0 lb

## 2017-12-14 DIAGNOSIS — K5909 Other constipation: Secondary | ICD-10-CM

## 2017-12-14 DIAGNOSIS — R945 Abnormal results of liver function studies: Secondary | ICD-10-CM | POA: Insufficient documentation

## 2017-12-14 DIAGNOSIS — B182 Chronic viral hepatitis C: Secondary | ICD-10-CM

## 2017-12-14 DIAGNOSIS — R7989 Other specified abnormal findings of blood chemistry: Secondary | ICD-10-CM

## 2017-12-14 DIAGNOSIS — D649 Anemia, unspecified: Secondary | ICD-10-CM

## 2017-12-14 LAB — FOLATE: Folate: 15.2 ng/mL (ref >5.9)

## 2017-12-14 LAB — PROTIME-INR
INR: 1.02
Prothrombin Time: 13.3 seconds (ref 11.4–15.2)

## 2017-12-14 LAB — CBC
HCT: 27.7 % — ABNORMAL LOW (ref 35.0–47.0)
Hemoglobin: 8.9 g/dL — ABNORMAL LOW (ref 12.0–16.0)
MCH: 26.4 pg (ref 26.0–34.0)
MCHC: 32.2 g/dL (ref 32.0–36.0)
MCV: 81.9 fL (ref 80.0–100.0)
Platelets: 224 10*3/uL (ref 150–440)
RBC: 3.38 MIL/uL — ABNORMAL LOW (ref 3.80–5.20)
RDW: 18.6 % — ABNORMAL HIGH (ref 11.5–14.5)
WBC: 11.8 10*3/uL — ABNORMAL HIGH (ref 3.6–11.0)

## 2017-12-14 LAB — FERRITIN: Ferritin: 93 ng/mL (ref 11–307)

## 2017-12-14 NOTE — Patient Instructions (Addendum)
High-Fiber Diet Fiber, also called dietary fiber, is a type of carbohydrate found in fruits, vegetables, whole grains, and beans. A high-fiber diet can have many health benefits. Your health care provider may recommend a high-fiber diet to help:  Prevent constipation. Fiber can make your bowel movements more regular.  Lower your cholesterol.  Relieve hemorrhoids, uncomplicated diverticulosis, or irritable bowel syndrome.  Prevent overeating as part of a weight-loss plan.  Prevent heart disease, type 2 diabetes, and certain cancers.  What is my plan? The recommended daily intake of fiber includes:  38 grams for men under age 72.  75 grams for men over age 54.  44 grams for women under age 41.  27 grams for women over age 72.  You can get the recommended daily intake of dietary fiber by eating a variety of fruits, vegetables, grains, and beans. Your health care provider may also recommend a fiber supplement if it is not possible to get enough fiber through your diet. What do I need to know about a high-fiber diet?  Fiber supplements have not been widely studied for their effectiveness, so it is better to get fiber through food sources.  Always check the fiber content on thenutrition facts label of any prepackaged food. Look for foods that contain at least 5 grams of fiber per serving.  Ask your dietitian if you have questions about specific foods that are related to your condition, especially if those foods are not listed in the following section.  Increase your daily fiber consumption gradually. Increasing your intake of dietary fiber too quickly may cause bloating, cramping, or gas.  Drink plenty of water. Water helps you to digest fiber. What foods can I eat? Grains Whole-grain breads. Multigrain cereal. Oats and oatmeal. Brown rice. Barley. Bulgur wheat. [place]. Bran muffins. Popcorn. Rye wafer crackers. Vegetables Sweet potatoes. Spinach. Kale. Artichokes. Cabbage.  Broccoli. Green peas. Carrots. Squash. Fruits Berries. Pears. Apples. Oranges. Avocados. Prunes and raisins. Dried figs. Meats and Other Protein Sources [place], kidney, pinto, and soy beans. Split peas. Lentils. Nuts and seeds. Dairy Fiber-fortified yogurt. Beverages Fiber-fortified soy milk. Fiber-fortified orange juice. Other Fiber bars. The items listed above may not be a complete list of recommended foods or beverages. Contact your dietitian for more options. What foods are not recommended? Grains White bread. Pasta made with refined flour. White rice. Vegetables Fried potatoes. Canned vegetables. Well-cooked vegetables. Fruits Fruit juice. Cooked, strained fruit. Meats and Other Protein Sources Fatty cuts of meat. Fried Sales executive or fried fish. Dairy Milk. Yogurt. Cream cheese. Sour cream. Beverages Soft drinks. Other Cakes and pastries. Butter and oils. The items listed above may not be a complete list of foods and beverages to avoid. Contact your dietitian for more information. What are some tips for including high-fiber foods in my diet?  Eat a wide variety of high-fiber foods.  Make sure that half of all grains consumed each day are whole grains.  Replace breads and cereals made from refined flour or white flour with whole-grain breads and cereals.  Replace white rice with brown rice, bulgur wheat, or millet.  Start the day with a breakfast that is high in fiber, such as a cereal that contains at least 5 grams of fiber per serving.  Use beans in place of meat in soups, salads, or pasta.  Eat high-fiber snacks, such as berries, raw vegetables, nuts, or popcorn. This information is not intended to replace advice given to you by your health care provider. Make sure you discuss any  questions you have with your health care provider. Document Released: 06/29/2005 Document Revised: 12/05/2015 Document Reviewed: 12/12/2013 Elsevier Interactive Patient Education  2018  [place].  Low-Sodium Eating Plan Sodium, which is an element that makes up salt, helps you maintain a healthy balance of fluids in your body. Too much sodium can increase your blood pressure and cause fluid and waste to be held in your body. Your health care provider or dietitian may recommend following this plan if you have high blood pressure (hypertension), kidney disease, liver disease, or heart failure. Eating less sodium can help lower your blood pressure, reduce swelling, and protect your heart, liver, and kidneys. What are tips for following this plan? General guidelines  Most people on this plan should limit their sodium intake to 1,500-2,000 mg (milligrams) of sodium each day. Reading food labels  The Nutrition Facts label lists the amount of sodium in one serving of the food. If you eat more than one serving, you must multiply the listed amount of sodium by the number of servings.  Choose foods with less than 140 mg of sodium per serving.  Avoid foods with 300 mg of sodium or more per serving. Shopping  Look for lower-sodium products, often labeled as "low-sodium" or "no salt added."  Always check the sodium content even if foods are labeled as "unsalted" or "no salt added".  Buy fresh foods. ? Avoid canned foods and premade or frozen meals. ? Avoid canned, cured, or processed meats  Buy breads that have less than 80 mg of sodium per slice. Cooking  Eat more home-cooked food and less restaurant, buffet, and fast food.  Avoid adding salt when cooking. Use salt-free seasonings or herbs instead of table salt or sea salt. Check with your health care provider or pharmacist before using salt substitutes.  Cook with plant-based oils, such as canola, sunflower, or olive oil. Meal planning  When eating at a restaurant, ask that your food be prepared with less salt or no salt, if possible.  Avoid foods that contain MSG (monosodium glutamate). MSG is sometimes added to  [place] food, bouillon, and some canned foods. What foods are recommended? The items listed may not be a complete list. Talk with your dietitian about what dietary choices are best for you. Grains Low-sodium cereals, including oats, puffed wheat and rice, and shredded wheat. Low-sodium crackers. Unsalted rice. Unsalted pasta. Low-sodium bread. Whole-grain breads and whole-grain pasta. Vegetables Fresh or frozen vegetables. "No salt added" canned vegetables. "No salt added" tomato sauce and paste. Low-sodium or reduced-sodium tomato and vegetable juice. Fruits Fresh, frozen, or canned fruit. Fruit juice. Meats and other protein foods Fresh or frozen (no salt added) meat, poultry, seafood, and fish. Low-sodium canned tuna and salmon. Unsalted nuts. Dried peas, beans, and lentils without added salt. Unsalted canned beans. Eggs. Unsalted nut butters. Dairy Milk. Soy milk. Cheese that is naturally low in sodium, such as ricotta cheese, fresh mozzarella, or Swiss cheese Low-sodium or reduced-sodium cheese. Cream cheese. Yogurt. Fats and oils Unsalted butter. Unsalted margarine with no trans fat. Vegetable oils such as canola or olive oils. Seasonings and other foods Fresh and dried herbs and spices. Salt-free seasonings. Low-sodium mustard and ketchup. Sodium-free salad dressing. Sodium-free light mayonnaise. Fresh or refrigerated horseradish. Lemon juice. Vinegar. Homemade, reduced-sodium, or low-sodium soups. Unsalted popcorn and pretzels. Low-salt or salt-free chips. What foods are not recommended? The items listed may not be a complete list. Talk with your dietitian about what dietary choices are best for you. Grains  Instant hot cereals. Bread stuffing, pancake, and biscuit mixes. Croutons. Seasoned rice or pasta mixes. Noodle soup cups. Boxed or frozen macaroni and cheese. Regular salted crackers. Self-rising flour. Vegetables Sauerkraut, pickled vegetables, and relishes. Olives. [place] fries.  Onion rings. Regular canned vegetables (not low-sodium or reduced-sodium). Regular canned tomato sauce and paste (not low-sodium or reduced-sodium). Regular tomato and vegetable juice (not low-sodium or reduced-sodium). Frozen vegetables in sauces. Meats and other protein foods Meat or fish that is salted, canned, smoked, spiced, or pickled. Bacon, ham, sausage, hotdogs, corned beef, chipped beef, packaged lunch meats, salt pork, jerky, pickled herring, anchovies, regular canned tuna, sardines, salted nuts. Dairy Processed cheese and cheese spreads. Cheese curds. Blue cheese. Feta cheese. String cheese. Regular cottage cheese. Buttermilk. Canned milk. Fats and oils Salted butter. Regular margarine. Ghee. Bacon fat. Seasonings and other foods Onion salt, garlic salt, seasoned salt, table salt, and sea salt. Canned and packaged gravies. Worcestershire sauce. Tartar sauce. Barbecue sauce. Teriyaki sauce. Soy sauce, including reduced-sodium. Steak sauce. Fish sauce. Oyster sauce. Cocktail sauce. Horseradish that you find on the shelf. Regular ketchup and mustard. Meat flavorings and tenderizers. Bouillon cubes. Hot sauce and Tabasco sauce. Premade or packaged marinades. Premade or packaged taco seasonings. Relishes. Regular salad dressings. Salsa. Potato and tortilla chips. Corn chips and puffs. Salted popcorn and pretzels. Canned or dried soups. Pizza. Frozen entrees and pot pies. Summary  Eating less sodium can help lower your blood pressure, reduce swelling, and protect your heart, liver, and kidneys.  Most people on this plan should limit their sodium intake to 1,500-2,000 mg (milligrams) of sodium each day.  Canned, boxed, and frozen foods are high in sodium. Restaurant foods, fast foods, and pizza are also very high in sodium. You also get sodium by adding salt to food.  Try to cook at home, eat more fresh fruits and vegetables, and eat less fast food, canned, processed, or prepared foods. This  information is not intended to replace advice given to you by your health care provider. Make sure you discuss any questions you have with your health care provider. Document Released: 12/19/2001 Document Revised: 06/22/2016 Document Reviewed: 06/22/2016 Elsevier Interactive Patient Education  [employer].

## 2017-12-14 NOTE — Progress Notes (Signed)
[name], MD [address]  [place], [place] [address]  Main: [phone]  Fax: [phone]    Gastroenterology Consultation  Referring Provider:     Florian Buff* Primary Care Physician:  [name], PA-C Primary Gastroenterologist:  Dr. [name] Reason for Consultation:     chronic hepatitis C, chronic constipation        HPI:   [name] is a 62 y.o. female referred by Dr. [name], PA-C  for consultation & management of and chronic hepatitis C infection and chronic constipation. Patient reports that she is found to have chronic hepatitis C approximately in 2010. She acknowledges practicing unprotected sex with multiple sexual partners and also using cocaine when she was young. He has mildly elevated transaminases,no evidence of Thrombocytopenia, jaundice. She has history of colon artery disease, underwent PCI with stent placement in 07/2016, currently on aspirin 81 and Brilinta daily, CHF, insulin-dependent diabetes, morbid obesity. She is on several different medications. Patient appears drowsy my office today but able to provide history and alert. She is accompanied by her daughter today.she reports that she was never treated for hepatitis C. No known cirrhosis. She also reports having chronic constipation, takes Colace and MiraLAX as needed. Associated with occasional straining. She has abdominal bloating. She denies melena, rectal bleeding, abdominal pain, nausea, vomiting, hematemesis Ultrasound right upper quadrant in 07/2013 revealed increased echogenicity of the liver consistent with steatosis, hepatomegaly  NSAIDs: none  Antiplts/Anticoagulants/Anti thrombotics: aspirin and Brilinta daily for coronary artery disease, and stent  GI Procedures: never had an EGD before Reports having had a colonoscopy more than 5 years ago, report not available She denies family history of GI malignancy  Past Medical History:    Diagnosis Date  . Anemia   . Aortic stenosis    Echo 8/18: mean 13, peak 28, LVOT/AV mean velocity 0.51  . Arthritis   . Asthma    As a child   . Bronchitis   . CAD (coronary artery disease)    a. 09/2016: 50% Ost 1st Mrg stenosis, 50% 2nd Mrg stenosis, 20% Mid-Cx, 95% Prox LAD, 40% mid-LAD, and 10% dist-LAD stenosis. Staged PCI with DES to Prox-LAD.   [name] Chronic combined systolic and diastolic CHF (congestive heart failure) ([place]) 2011   echo 2/18: EF 55-60, normal wall motion, grade 2 diastolic dysfunction, trivial AI // echo 3/18: Septal and apical HK, EF 45-50, normal wall motion, trivial AI, mild LAE, PASP 38 // echo 8/18: EF 60-65, normal wall motion, grade 1 diastolic dysfunction, calcified aortic valve leaflets, mild aortic stenosis (mean 13, peak 28, LVOT/AV mean velocity 0.51), mild AI, moderate MAC, mild LAE, trivial TR   . Chronic kidney disease    STAGE 4  . Depression   . Diabetes mellitus Dx 1989  . GERD (gastroesophageal reflux disease)   . Gout   . Hepatitis C Dx 2013  . Hypertension Dx 1989  . Obesity   . Obstructive sleep apnea   . Pancreatitis 2013  . Refusal of blood transfusions as patient is Jehovah's Witness   . Tendinitis   . Tremors of nervous system    LEFT HAND  . Ulcer 2010    Past Surgical History:  Procedure Laterality Date  . APPLICATION OF WOUND VAC Left 08/14/2017   Procedure: APPLICATION OF WOUND VAC Exchange;  Surgeon: Robert Bellow, MD;  Location: [facility];  Service: General;  Laterality: Left;  . CHOLECYSTECTOMY    .  CORONARY ANGIOPLASTY     STENT  . CORONARY STENT INTERVENTION N/A 09/18/2016   Procedure: Coronary Stent Intervention;  Surgeon: Troy Sine, MD;  Location: [facility];  Service: Cardiovascular;  Laterality: N/A;  . DRESSING CHANGE UNDER ANESTHESIA Left 08/15/2017   Procedure: exploration of wound for bleeding;  Surgeon: Robert Bellow, MD;  Location: [facility];  Service: General;  Laterality: Left;  . EYE  SURGERY    . INCISION AND DRAINAGE ABSCESS Left 08/12/2017   Procedure: INCISION AND DRAINAGE ABSCESS;  Surgeon: Robert Bellow, MD;  Location: [facility];  Service: General;  Laterality: Left;  . KNEE ARTHROSCOPY    . LEFT HEART CATH N/A 09/18/2016   Procedure: Left Heart Cath;  Surgeon: Troy Sine, MD;  Location: [facility];  Service: Cardiovascular;  Laterality: N/A;  . LEFT HEART CATH AND CORONARY ANGIOGRAPHY N/A 09/16/2016   Procedure: Left Heart Cath and Coronary Angiography;  Surgeon: Burnell Blanks, MD;  Location: [facility];  Service: Cardiovascular;  Laterality: N/A;  . LEFT HEART CATH AND CORONARY ANGIOGRAPHY N/A 04/29/2017   Procedure: LEFT HEART CATH AND CORONARY ANGIOGRAPHY;  Surgeon: Nelva Bush, MD;  Location: [facility];  Service: Cardiovascular;  Laterality: N/A;  . TUBAL LIGATION    . TUBAL LIGATION      Current Outpatient Medications:  .  albuterol (PROVENTIL) (2.5 MG/3ML) 0.083% nebulizer solution, Take 3 mLs (2.5 mg total) by nebulization every 6 (six) hours as needed for wheezing or shortness of breath., Disp: 150 mL, Rfl: 1 .  albuterol (VENTOLIN HFA) 108 (90 Base) MCG/ACT inhaler, Inhale 2 puffs into the lungs every 4 (four) hours as needed for wheezing or shortness of breath., Disp: 54 g, Rfl: 3 .  allopurinol (ZYLOPRIM) 300 MG tablet, Take 1 tablet (300 mg total) by mouth daily., Disp: 90 tablet, Rfl: 1 .  aspirin EC 81 MG EC tablet, Take 1 tablet (81 mg total) by mouth daily., Disp: , Rfl:  .  atorvastatin (LIPITOR) 80 MG tablet, Take 1 tablet (80 mg total) by mouth daily at 6 PM., Disp: 90 tablet, Rfl: 3 .  Blood Glucose Monitoring Suppl (ACCU-CHEK AVIVA) device, Use as instructed daily., Disp: 1 each, Rfl: 0 .  Blood Pressure Monitoring (ADULT BLOOD PRESSURE CUFF LG) KIT, Check blood pressure once daily, Disp: 1 each, Rfl: 0 .  carvedilol (COREG) 25 MG tablet, TAKE 1 TABLET BY MOUTH 2 TIMES DAILY WITH A MEAL. (Patient taking  differently: Take 25 mg by mouth 2 (two) times daily with a meal. ), Disp: 180 tablet, Rfl: 3 .  Difluprednate (DUREZOL) 0.05 % EMUL, , Disp: , Rfl:  .  docusate sodium (COLACE) 100 MG capsule, Take 1 capsule (100 mg total) by mouth 2 (two) times daily as needed for mild constipation., Disp: 30 capsule, Rfl: 0 .  Dulaglutide (TRULICITY) 2.99 BZ/1.6RC SOPN, Inject 0.75 mg into the skin once a week. (Patient taking differently: Inject 0.75 mg into the skin every Tuesday. ), Disp: 4 pen, Rfl: 3 .  ferrous sulfate 325 (65 FE) MG tablet, Take 1 tablet (325 mg total) by mouth 2 (two) times daily with a meal., Disp: 180 tablet, Rfl: 3 .  fluticasone (FLONASE) 50 MCG/ACT nasal spray, Place 2 sprays into both nostrils daily as needed for allergies or rhinitis., Disp: 48 g, Rfl: 1 .  Fluticasone-Salmeterol (ADVAIR DISKUS) 250-50 MCG/DOSE AEPB, Inhale 1 puff into the lungs 2 (two) times daily., Disp: 180 each,  Rfl: 3 .  furosemide (LASIX) 40 MG tablet, Take by mouth., Disp: , Rfl:  .  glucose blood (ACCU-CHEK AVIVA) test strip, Use as instructed daily, Disp: 100 each, Rfl: 12 .  glucose blood (TRUE METRIX BLOOD GLUCOSE TEST) test strip, Use as instructed, Disp: 100 each, Rfl: 12 .  hydrALAZINE (APRESOLINE) 50 MG tablet, Take 1 tablet (50 mg total) by mouth every 8 (eight) hours., Disp: 90 tablet, Rfl: 0 .  HYDROcodone-acetaminophen (NORCO/VICODIN) 5-325 MG tablet, TAKE 1-2 TABLET EVERY 4-6 HOURS AS NEEDED FOR PAIN, Disp: , Rfl:  .  hydrOXYzine (ATARAX/VISTARIL) 50 MG tablet, Take 1 tablet (50 mg total) by mouth 2 (two) times daily as needed. (Patient taking differently: Take 50 mg by mouth 3 (three) times daily. ), Disp: 60 tablet, Rfl: 0 .  insulin aspart (NOVOLOG) 100 UNIT/ML FlexPen, Inject 10 Units into the skin 3 (three) times daily with meals., Disp: 15 mL, Rfl: 3 .  Insulin Glargine (LANTUS SOLOSTAR) 100 UNIT/ML Solostar Pen, Inject 48 Units into the skin 2 (two) times daily., Disp: 24 mL, Rfl: 5 .   Insulin Pen Needle (PEN NEEDLES) 30G X 8 MM MISC, 1 each by Does not apply route 3 (three) times daily before meals. AC for novolog injections, Disp: 100 each, Rfl: 11 .  isosorbide mononitrate (IMDUR) 30 MG 24 hr tablet, TAKE 3 TABLETS(90 MG) BY MOUTH DAILY, Disp: 270 tablet, Rfl: 1 .  Lancet Devices (ACCU-CHEK SOFTCLIX) lancets, Use as instructed daily., Disp: 1 each, Rfl: 5 .  loratadine (CLARITIN) 10 MG tablet, Take 1 tablet (10 mg total) by mouth daily., Disp: 90 tablet, Rfl: 1 .  metolazone (ZAROXOLYN) 2.5 MG tablet, Take 1 tablet (2.5 mg total) by mouth daily as needed (swelling)., Disp: 90 tablet, Rfl: 1 .  MITIGARE 0.6 MG CAPS, Take 1 capsule by mouth daily as needed. (Patient taking differently: Take 1 capsule by mouth daily as needed (for gout). ), Disp: 90 capsule, Rfl: 1 .  montelukast (SINGULAIR) 10 MG tablet, Take 1 tablet (10 mg total) by mouth at bedtime., Disp: 90 tablet, Rfl: 1 .  nepafenac (ILEVRO) 0.3 % ophthalmic suspension, , Disp: , Rfl:  .  nitroGLYCERIN (NITROSTAT) 0.4 MG SL tablet, Place 1 tablet (0.4 mg total) under the tongue every 5 (five) minutes x 3 doses as needed for chest pain., Disp: 25 tablet, Rfl: 12 .  omeprazole (PRILOSEC) 20 MG capsule, Take 1 capsule (20 mg total) by mouth daily., Disp: 90 capsule, Rfl: 3 .  oxyCODONE-acetaminophen (PERCOCET/ROXICET) 5-325 MG tablet, TAKE 1 TABLET BY MOUTH EVERY 6 HOURS AS NEEDED FOR SEVERE PAIN, Disp: , Rfl:  .  potassium chloride SA (K-DUR,KLOR-CON) 20 MEQ tablet, Take 1 tablet (20 mEq total) by mouth 2 (two) times daily., Disp: 60 tablet, Rfl: 5 .  pregabalin (LYRICA) 300 MG capsule, Take 1 capsule (300 mg total) by mouth at bedtime., Disp: 90 capsule, Rfl: 1 .  ticagrelor (BRILINTA) 90 MG TABS tablet, Take 1 tablet (90 mg total) by mouth 2 (two) times daily., Disp: 180 tablet, Rfl: 3 .  torsemide (DEMADEX) 20 MG tablet, Take 2 tablets (40 mg total) by mouth 2 (two) times daily., Disp: 120 tablet, Rfl: 0 .  valACYclovir  (VALTREX) 500 MG tablet, Take 1 tablet (500 mg total) by mouth daily., Disp: 90 tablet, Rfl: 3 .  Vitamin D, Ergocalciferol, (DRISDOL) 50000 units CAPS capsule, TAKE 1 CAPSULE BY MOUTH ONCE A MONTH, Disp: 3 capsule, Rfl: 3 .  ketotifen (ZADITOR) 0.025 % ophthalmic  solution, Place 1 drop into both eyes daily as needed (for dry eyes). (Patient not taking: Reported on 12/14/2017), Disp: 10 mL, Rfl: 2 .  meclizine (ANTIVERT) 25 MG tablet, Take 1 tablet (25 mg total) by mouth 3 (three) times daily as needed for dizziness. (Patient not taking: Reported on 12/14/2017), Disp: 30 tablet, Rfl: 3    Family History  Problem Relation Age of Onset  . Colon cancer Mother   . Heart attack Other   . Heart attack Maternal Grandmother   . Hypertension Sister   . Hypertension Brother   . Diabetes Paternal Grandmother   . Breast cancer Neg Hx      Social History   Tobacco Use  . Smoking status: Former Smoker    Last attempt to quit: 10/25/1980    Years since quitting: 37.1  . Smokeless tobacco: Never Used  . Tobacco comment: quit smoking in 1982  Substance Use Topics  . Alcohol use: Yes    Comment: 3 times in last year  . Drug use: No    Types: "Crack" cocaine    Comment: 08/21/2016 "clean since 05/1998"    Allergies as of 12/14/2017 - Review Complete 12/14/2017  Allergen Reaction Noted  . Shellfish allergy Anaphylaxis and Swelling 05/14/2012  . Diazepam Other (See Comments) 10/07/2015  . Morphine and related Itching 05/14/2012    Review of Systems:    All systems reviewed and negative except where noted in HPI.   Physical Exam:  BP 115/70 (BP Location: Left Arm, Patient Position: Sitting, Cuff Size: Large)   Pulse 86   Resp 18   Ht _0  (1.727 m)   Wt 296 lb (134.3 kg)   BMI 45.01 kg/m  No LMP recorded. Patient has had an ablation.  General: drowsy but arousable, Alert,  obese, pleasant and cooperative in NAD Head:  Normocephalic and atraumatic. Eyes:  Sclera clear, no icterus.    Conjunctiva pink. Ears:  Normal auditory acuity. Nose:  No deformity, discharge, or lesions. Mouth:  No deformity or lesions,oropharynx pink & moist. Neck:  Supple; no masses or thyromegaly. Lungs:  Respirations even and unlabored.  Clear throughout to auscultation.   No wheezes, crackles, or rhonchi. No acute distress. Heart:  Regular rate and rhythm; no murmurs, clicks, rubs, or gallops. Abdomen:  Normal bowel sounds. Severe abdominal obesity, Soft, non-tender and limited abdominal exam due to body habitus. No guarding or rebound tenderness.   Rectal: Not performed Msk:  Symmetrical without gross deformities. Good, equal movement & strength bilaterally. Pulses:  Normal pulses noted. Extremities:  No clubbing, 2+ edema.  No cyanosis. Neurologic:  Alert and oriented x3;  grossly normal neurologically. Skin:  She has a wound dressing in left inner aspect of upper thigh. No jaundice. Lymph Nodes:  No significant cervical adenopathy. Psych:  Alert and cooperative. Normal mood and affect.  Imaging Studies: reviewed  Assessment and Plan:   [name] is a 62 y.o. African-American female with metabolic syndrome, insulin-dependent diabetes, coronary artery disease, status post PCI, on dual antiplatelet therapy, chronic hepatitis C, treatment nave. She does not have biochemical or imaging, clinical evidence of cirrhosis  Chronic hepatitis C: Recheck viral load, genotype Complete secondary liver disease workup Ultrasound liver with Dopplers EGD for variceal screening Treat hep C after the above workup Low-sodium diet  Colon cancer screening: Schedule colonoscopy with 2 day prep  Chronic constipation: Recommend high-fiber diet Fiber supplements and stool softeners   Follow up in 4 weeks   Cannie Muckle R  Marius Ditch, MD

## 2017-12-15 ENCOUNTER — Other Ambulatory Visit: Payer: Self-pay

## 2017-12-15 DIAGNOSIS — D649 Anemia, unspecified: Secondary | ICD-10-CM

## 2017-12-15 LAB — ALPHA-1 ANTITRYPSIN PHENOTYPE: A-1 Antitrypsin, Ser: 173 mg/dL (ref 90–200)

## 2017-12-15 LAB — HEPATITIS A ANTIBODY, TOTAL: Hep A Total Ab: POSITIVE — AB

## 2017-12-15 LAB — HEPATITIS B SURFACE ANTIBODY, QUANTITATIVE: Hepatitis B-Post: <3.1 m[IU]/mL — ABNORMAL LOW (ref >9.9)

## 2017-12-15 LAB — HEPATITIS B SURFACE ANTIGEN: Hepatitis B Surface Ag: NEGATIVE

## 2017-12-15 LAB — HEPATITIS B CORE ANTIBODY, TOTAL: Hep B Core Total Ab: NEGATIVE

## 2017-12-15 LAB — ANTI-MICROSOMAL ANTIBODY LIVER / KIDNEY: LKM1 Ab: 1.1 Units (ref 0.0–20.0)

## 2017-12-15 LAB — ANA W/REFLEX IF POSITIVE: Anti Nuclear Antibody(ANA): NEGATIVE

## 2017-12-15 LAB — CERULOPLASMIN: Ceruloplasmin: 46.3 mg/dL — ABNORMAL HIGH (ref 19.0–39.0)

## 2017-12-16 ENCOUNTER — Ambulatory Visit
Admission: RE | Admit: 2017-12-16 | Discharge: 2017-12-16 | Disposition: A | Discharge status: Home / Self Care | Payer: Medicaid Other | Source: Ambulatory Visit | Attending: Gastroenterology | Admitting: Gastroenterology

## 2017-12-16 ENCOUNTER — Other Ambulatory Visit
Admission: RE | Admit: 2017-12-16 | Discharge: 2017-12-16 | Disposition: A | Discharge status: Home / Self Care | Payer: Medicaid Other | Source: Ambulatory Visit | Attending: Gastroenterology | Admitting: Gastroenterology

## 2017-12-16 DIAGNOSIS — B182 Chronic viral hepatitis C: Secondary | ICD-10-CM | POA: Insufficient documentation

## 2017-12-16 DIAGNOSIS — R945 Abnormal results of liver function studies: Secondary | ICD-10-CM | POA: Insufficient documentation

## 2017-12-16 LAB — ANTI-SMOOTH MUSCLE ANTIBODY, IGG: F-Actin IgG: 9 Units (ref 0–19)

## 2017-12-16 LAB — MITOCHONDRIAL ANTIBODIES: Mitochondrial M2 Ab, IgG: <20 Units (ref 0.0–20.0)

## 2017-12-17 ENCOUNTER — Ambulatory Visit: Payer: Self-pay | Admitting: Physician Assistant

## 2017-12-17 ENCOUNTER — Ambulatory Visit: Payer: Medicaid Other | Admitting: Cardiology

## 2017-12-17 ENCOUNTER — Other Ambulatory Visit: Payer: Self-pay | Admitting: Physician Assistant

## 2017-12-17 DIAGNOSIS — I5042 Chronic combined systolic (congestive) and diastolic (congestive) heart failure: Secondary | ICD-10-CM

## 2017-12-17 LAB — HEPATITIS C GENOTYPE

## 2017-12-17 NOTE — Progress Notes (Deleted)
Cardiology Office Note:    Date:  12/17/2017   ID:  [name], DOB Feb 14, 1956, MRN [id number]  PCP:  [name], PA-C  Cardiologist:  Sherren Mocha, MD  Referring MD: Florian Buff*   No chief complaint on file. ***  History of Present Illness:    [name] is a 62 y.o. female with a past medical history significant for DM, hypertension, hyperlipidemia, CKD, hepatitis C, OSA, CAD s/p NSTEMI 12/7891, chronic diastolic heart failure, previous tobacco use and mild aortic stenosis.  [name] was recently admitted to the hospital 10/03/17-10/11/17 for acute on chronic diastolic CHF with anasarca and shortness of breath. Her symptoms were noted to be secondary to noncompliance with medical therapy and lifestyle. She was treated with IV Lasix, converted to oral torsemide with metolazone as needed. She was recommended for right heart catheterization as outpatient. She also had acute on chronic kidney disease stage IV and was seen by nephrology. No dialysis was recommended at the time. She has also been admitted earlier in March for CHF.    [name] is followed in the heart failure clinic at [facility].    ------------------------------------------------------    diastolic CHF   --> HF clinic, possible RHC    Hypertension: hydralazine recently increased to 75 mg 3 times a day in the hospital. A voiding amlodipineif possible.    CAD status post stent to LAD and 07/2016. She continues on aspirin, Brilinta, Coreg, Imdur and Lipitor  Diabetes type 2 on insulin: Poorly controlled    CKD stage IV: seen by Dr.Kolluru in the hospital and planned for follow up in his office with labs.  Aortic stenosis    obesity     Past Medical History:  Diagnosis Date  . Anemia   . Aortic stenosis    Echo 8/18: mean 13, peak 28, LVOT/AV mean velocity 0.51  . Arthritis   . Asthma    As a child   . Bronchitis   . CAD (coronary artery disease)    a. 09/2016: 50% Ost 1st Mrg  stenosis, 50% 2nd Mrg stenosis, 20% Mid-Cx, 95% Prox LAD, 40% mid-LAD, and 10% dist-LAD stenosis. Staged PCI with DES to Prox-LAD.   [name] Chronic combined systolic and diastolic CHF (congestive heart failure) ([place]) 2011   echo 2/18: EF 55-60, normal wall motion, grade 2 diastolic dysfunction, trivial AI // echo 3/18: Septal and apical HK, EF 45-50, normal wall motion, trivial AI, mild LAE, PASP 38 // echo 8/18: EF 60-65, normal wall motion, grade 1 diastolic dysfunction, calcified aortic valve leaflets, mild aortic stenosis (mean 13, peak 28, LVOT/AV mean velocity 0.51), mild AI, moderate MAC, mild LAE, trivial TR   . Chronic kidney disease    STAGE 4  . Depression   . Diabetes mellitus Dx 1989  . GERD (gastroesophageal reflux disease)   . Gout   . Hepatitis C Dx 2013  . Hypertension Dx 1989  . Obesity   . Obstructive sleep apnea   . Pancreatitis 2013  . Refusal of blood transfusions as patient is Jehovah's Witness   . Tendinitis   . Tremors of nervous system    LEFT HAND  . Ulcer 2010    Past Surgical History:  Procedure Laterality Date  . APPLICATION OF WOUND VAC Left 08/14/2017   Procedure: APPLICATION OF WOUND VAC Exchange;  Surgeon: Robert Bellow, MD;  Location: [facility];  Service: General;  Laterality: Left;  . CHOLECYSTECTOMY    . CORONARY ANGIOPLASTY  STENT  . CORONARY STENT INTERVENTION N/A 09/18/2016   Procedure: Coronary Stent Intervention;  Surgeon: Troy Sine, MD;  Location: [facility];  Service: Cardiovascular;  Laterality: N/A;  . DRESSING CHANGE UNDER ANESTHESIA Left 08/15/2017   Procedure: exploration of wound for bleeding;  Surgeon: Robert Bellow, MD;  Location: [facility];  Service: General;  Laterality: Left;  . EYE SURGERY    . INCISION AND DRAINAGE ABSCESS Left 08/12/2017   Procedure: INCISION AND DRAINAGE ABSCESS;  Surgeon: Robert Bellow, MD;  Location: [facility];  Service: General;  Laterality: Left;  . KNEE ARTHROSCOPY    . LEFT HEART CATH  N/A 09/18/2016   Procedure: Left Heart Cath;  Surgeon: Troy Sine, MD;  Location: [facility];  Service: Cardiovascular;  Laterality: N/A;  . LEFT HEART CATH AND CORONARY ANGIOGRAPHY N/A 09/16/2016   Procedure: Left Heart Cath and Coronary Angiography;  Surgeon: Burnell Blanks, MD;  Location: [facility];  Service: Cardiovascular;  Laterality: N/A;  . LEFT HEART CATH AND CORONARY ANGIOGRAPHY N/A 04/29/2017   Procedure: LEFT HEART CATH AND CORONARY ANGIOGRAPHY;  Surgeon: Nelva Bush, MD;  Location: [facility];  Service: Cardiovascular;  Laterality: N/A;  . TUBAL LIGATION    . TUBAL LIGATION      Current Medications: No outpatient medications have been marked as taking for the 12/17/17 encounter (Appointment) with Daune Perch, NP.     Allergies:   Shellfish allergy; Diazepam; and Morphine and related   Social History   Socioeconomic History  . Marital status: Divorced    Spouse name: Not on file  . Number of children: Not on file  . Years of education: Not on file  . Highest education level: Not on file  Occupational History  . Not on file  Social Needs  . Financial resource strain: Not on file  . Food insecurity:    Worry: Not on file    Inability: Not on file  . Transportation needs:    Medical: Not on file    Non-medical: Not on file  Tobacco Use  . Smoking status: Former Smoker    Last attempt to quit: 10/25/1980    Years since quitting: 37.1  . Smokeless tobacco: Never Used  . Tobacco comment: quit smoking in 1982  Substance and Sexual Activity  . Alcohol use: Yes    Comment: 3 times in last year  . Drug use: No    Types: "Crack" cocaine    Comment: 08/21/2016 "clean since 05/1998"  . Sexual activity: Not on file    Comment: Not asked  Lifestyle  . Physical activity:    Days per week: Not on file    Minutes per session: Not on file  . Stress: Not on file  Relationships  . Social connections:    Talks on phone: Not on file     Gets together: Not on file    Attends religious service: Not on file    Active member of club or organization: Not on file    Attends meetings of clubs or organizations: Not on file    Relationship status: Not on file  Other Topics Concern  . Not on file  Social History Narrative  . Not on file     Family History: The patient's ***family history includes Colon cancer in her mother; Diabetes in her paternal grandmother; Heart attack in her maternal grandmother and other; Hypertension in her brother and sister. There is no  history of Breast cancer. ROS:   Please see the history of present illness.    *** All other systems reviewed and are negative.  EKGs/Labs/Other Studies Reviewed:    The following studies were reviewed today: ***  EKG:  EKG is *** ordered today.  The ekg ordered today demonstrates ***  Recent Labs: 08/05/2017: NT-Pro BNP 401 08/11/2017: TSH 1.444 10/03/2017: ALT 74; B Natriuretic Peptide 147.0 10/07/2017: Magnesium 1.7 10/11/2017: BUN 97; Creatinine, Ser 2.70; Potassium 3.6; Sodium 139 12/14/2017: Hemoglobin 8.9; Platelets 224   Recent Lipid Panel    Component Value Date/Time   CHOL 122 09/13/2016 1245   TRIG 168 (H) 09/13/2016 1245   HDL 36 (L) 09/13/2016 1245   CHOLHDL 3.4 09/13/2016 1245   VLDL 34 09/13/2016 1245   LDLCALC 52 09/13/2016 1245    Physical Exam:    VS:  There were no vitals taken for this visit.    Wt Readings from Last 3 Encounters:  12/14/17 296 lb (134.3 kg)  12/03/17 298 lb (135.2 kg)  11/26/17 (!) 303 lb (137.4 kg)     Physical Exam***   ASSESSMENT:    No diagnosis found. PLAN:    In order of problems listed above:  1. ***   Medication Adjustments/Labs and Tests Ordered: Current medicines are reviewed at length with the patient today.  Concerns regarding medicines are outlined above. Labs and tests ordered and medication changes are outlined in the patient instructions below:  There are no Patient Instructions on file  for this visit.   Signed, Daune Perch, NP  12/17/2017 8:08 AM    [place]

## 2017-12-21 ENCOUNTER — Telehealth: Payer: Self-pay | Admitting: Physician Assistant

## 2017-12-21 NOTE — Telephone Encounter (Signed)
Cindy with [employer] wanting a copy of her last office visit note faxed to [place] would like a nurse call her back.  She said she is having a hard time understanding patient.  She seems pretty out of it when she talks to her.   She is just concerned about her and would like to compared notes to how the patient is doing.    Call back is [phone]  Thanks teri

## 2017-12-22 MED ORDER — MOXIFLOXACIN HCL 0.5 % OP SOLN: 1.0000 [drp] | OPHTHALMIC | Status: DC | PRN

## 2017-12-22 MED ORDER — ARMC OPHTHALMIC DILATING DROPS: 1.0000 "application " | OPHTHALMIC | Status: AC

## 2017-12-22 MED ORDER — SODIUM CHLORIDE 0.9 % IV SOLN
INTRAVENOUS | Status: DC
Start: 2017-12-22 — End: 2018-02-03

## 2017-12-22 MED ORDER — SODIUM CHLORIDE 0.9 % IV SOLN
INTRAVENOUS | Status: DC
  Administered 2018-02-03: 09:00:00 via INTRAVENOUS

## 2017-12-22 NOTE — Telephone Encounter (Signed)
[facility] called saying the fax number she gave you she wasn't able to get the records and could you please fax them to this number [phone]  Thanks teri

## 2017-12-22 NOTE — Telephone Encounter (Signed)
Ok to send 12/03/17 note but also let them know she is scheduled to come in on Friday so they may wish to have that note instead.

## 2017-12-22 NOTE — Telephone Encounter (Signed)
Re faxed to new fax number. sd

## 2017-12-22 NOTE — Telephone Encounter (Signed)
FAXED OFFICE NOTES. SD

## 2017-12-23 ENCOUNTER — Telehealth: Payer: Self-pay

## 2017-12-23 ENCOUNTER — Telehealth: Payer: Self-pay | Admitting: Gastroenterology

## 2017-12-23 NOTE — Telephone Encounter (Signed)
Patient has been rescheduled due to previous date provided was not available.  She has been rescheduled to 01/20/18.  Informed [name].  Thanks [employer]

## 2017-12-23 NOTE — Telephone Encounter (Signed)
Patient called to cancel her procedure for today due to a family emergency and going out of town.  Please call today if possible to reschedule. I called [facility] and Trish said she would keep it in the depot

## 2017-12-23 NOTE — Telephone Encounter (Signed)
Patients call has been returned.  She has been rescheduled to 12/30/17.  Trish in Endo has been informed.  Thanks [employer]

## 2017-12-24 ENCOUNTER — Ambulatory Visit (INDEPENDENT_AMBULATORY_CARE_PROVIDER_SITE_OTHER): Payer: Medicaid Other | Admitting: Physician Assistant

## 2017-12-24 ENCOUNTER — Other Ambulatory Visit: Payer: Self-pay

## 2017-12-24 ENCOUNTER — Encounter: Payer: Self-pay | Admitting: Physician Assistant

## 2017-12-24 ENCOUNTER — Inpatient Hospital Stay (HOSPITAL_COMMUNITY)
Admission: EM | Admit: 2017-12-24 | Discharge: 2017-12-28 | DRG: 682 | Disposition: A | Discharge status: Home Health | Payer: Medicare Other | Attending: Internal Medicine | Admitting: Internal Medicine

## 2017-12-24 ENCOUNTER — Encounter (HOSPITAL_COMMUNITY): Payer: Self-pay

## 2017-12-24 VITALS — BP 140/80 | HR 99 | Temp 98.4°F | Resp 18 | Wt 293.0 lb

## 2017-12-24 DIAGNOSIS — Z6841 Body Mass Index (BMI) 40.0 and over, adult: Secondary | ICD-10-CM

## 2017-12-24 DIAGNOSIS — N183 Chronic kidney disease, stage 3 unspecified: Secondary | ICD-10-CM | POA: Diagnosis present

## 2017-12-24 DIAGNOSIS — IMO0002 Reserved for concepts with insufficient information to code with codable children: Secondary | ICD-10-CM

## 2017-12-24 DIAGNOSIS — R627 Adult failure to thrive: Secondary | ICD-10-CM | POA: Diagnosis present

## 2017-12-24 DIAGNOSIS — N189 Chronic kidney disease, unspecified: Secondary | ICD-10-CM

## 2017-12-24 DIAGNOSIS — Z794 Long term (current) use of insulin: Secondary | ICD-10-CM

## 2017-12-24 DIAGNOSIS — G629 Polyneuropathy, unspecified: Secondary | ICD-10-CM

## 2017-12-24 DIAGNOSIS — J45909 Unspecified asthma, uncomplicated: Secondary | ICD-10-CM | POA: Diagnosis present

## 2017-12-24 DIAGNOSIS — R251 Tremor, unspecified: Secondary | ICD-10-CM

## 2017-12-24 DIAGNOSIS — G4733 Obstructive sleep apnea (adult) (pediatric): Secondary | ICD-10-CM | POA: Diagnosis present

## 2017-12-24 DIAGNOSIS — E669 Obesity, unspecified: Secondary | ICD-10-CM

## 2017-12-24 DIAGNOSIS — N289 Disorder of kidney and ureter, unspecified: Secondary | ICD-10-CM | POA: Diagnosis not present

## 2017-12-24 DIAGNOSIS — B182 Chronic viral hepatitis C: Secondary | ICD-10-CM | POA: Diagnosis present

## 2017-12-24 DIAGNOSIS — A6000 Herpesviral infection of urogenital system, unspecified: Secondary | ICD-10-CM

## 2017-12-24 DIAGNOSIS — Z79899 Other long term (current) drug therapy: Secondary | ICD-10-CM

## 2017-12-24 DIAGNOSIS — E118 Type 2 diabetes mellitus with unspecified complications: Secondary | ICD-10-CM

## 2017-12-24 DIAGNOSIS — E1165 Type 2 diabetes mellitus with hyperglycemia: Secondary | ICD-10-CM

## 2017-12-24 DIAGNOSIS — K219 Gastro-esophageal reflux disease without esophagitis: Secondary | ICD-10-CM | POA: Diagnosis present

## 2017-12-24 DIAGNOSIS — I5032 Chronic diastolic (congestive) heart failure: Secondary | ICD-10-CM | POA: Diagnosis not present

## 2017-12-24 DIAGNOSIS — N179 Acute kidney failure, unspecified: Secondary | ICD-10-CM

## 2017-12-24 DIAGNOSIS — I10 Essential (primary) hypertension: Secondary | ICD-10-CM | POA: Diagnosis present

## 2017-12-24 DIAGNOSIS — Z833 Family history of diabetes mellitus: Secondary | ICD-10-CM

## 2017-12-24 DIAGNOSIS — M79674 Pain in right toe(s): Secondary | ICD-10-CM | POA: Diagnosis not present

## 2017-12-24 DIAGNOSIS — M10371 Gout due to renal impairment, right ankle and foot: Secondary | ICD-10-CM | POA: Diagnosis not present

## 2017-12-24 DIAGNOSIS — I2510 Atherosclerotic heart disease of native coronary artery without angina pectoris: Secondary | ICD-10-CM | POA: Diagnosis present

## 2017-12-24 DIAGNOSIS — R739 Hyperglycemia, unspecified: Secondary | ICD-10-CM | POA: Diagnosis present

## 2017-12-24 DIAGNOSIS — E1122 Type 2 diabetes mellitus with diabetic chronic kidney disease: Secondary | ICD-10-CM | POA: Diagnosis present

## 2017-12-24 DIAGNOSIS — Z7982 Long term (current) use of aspirin: Secondary | ICD-10-CM

## 2017-12-24 DIAGNOSIS — Z91013 Allergy to seafood: Secondary | ICD-10-CM

## 2017-12-24 DIAGNOSIS — I5042 Chronic combined systolic (congestive) and diastolic (congestive) heart failure: Secondary | ICD-10-CM | POA: Diagnosis present

## 2017-12-24 DIAGNOSIS — Z8249 Family history of ischemic heart disease and other diseases of the circulatory system: Secondary | ICD-10-CM

## 2017-12-24 DIAGNOSIS — Z955 Presence of coronary angioplasty implant and graft: Secondary | ICD-10-CM

## 2017-12-24 DIAGNOSIS — N184 Chronic kidney disease, stage 4 (severe): Secondary | ICD-10-CM | POA: Diagnosis not present

## 2017-12-24 DIAGNOSIS — E1142 Type 2 diabetes mellitus with diabetic polyneuropathy: Secondary | ICD-10-CM | POA: Diagnosis present

## 2017-12-24 DIAGNOSIS — I130 Hypertensive heart and chronic kidney disease with heart failure and stage 1 through stage 4 chronic kidney disease, or unspecified chronic kidney disease: Secondary | ICD-10-CM | POA: Diagnosis not present

## 2017-12-24 DIAGNOSIS — Z7951 Long term (current) use of inhaled steroids: Secondary | ICD-10-CM

## 2017-12-24 DIAGNOSIS — E1140 Type 2 diabetes mellitus with diabetic neuropathy, unspecified: Secondary | ICD-10-CM | POA: Diagnosis present

## 2017-12-24 DIAGNOSIS — D631 Anemia in chronic kidney disease: Secondary | ICD-10-CM | POA: Diagnosis present

## 2017-12-24 DIAGNOSIS — M25569 Pain in unspecified knee: Secondary | ICD-10-CM

## 2017-12-24 DIAGNOSIS — E860 Dehydration: Secondary | ICD-10-CM | POA: Diagnosis present

## 2017-12-24 DIAGNOSIS — M109 Gout, unspecified: Secondary | ICD-10-CM | POA: Diagnosis present

## 2017-12-24 DIAGNOSIS — E1100 Type 2 diabetes mellitus with hyperosmolarity without nonketotic hyperglycemic-hyperosmolar coma (NKHHC): Secondary | ICD-10-CM | POA: Diagnosis not present

## 2017-12-24 DIAGNOSIS — Z87891 Personal history of nicotine dependence: Secondary | ICD-10-CM

## 2017-12-24 LAB — CBC WITH DIFFERENTIAL/PLATELET
Basophils Absolute: 0 10*3/uL (ref 0.0–0.1)
Basophils Relative: 0 %
Eosinophils Absolute: 0.4 10*3/uL (ref 0.0–0.7)
Eosinophils Relative: 4 %
HCT: 29.6 % — ABNORMAL LOW (ref 36.0–46.0)
Hemoglobin: 9.2 g/dL — ABNORMAL LOW (ref 12.0–15.0)
Lymphocytes Relative: 20 %
Lymphs Abs: 2.2 10*3/uL (ref 0.7–4.0)
MCH: 25.8 pg — ABNORMAL LOW (ref 26.0–34.0)
MCHC: 31.1 g/dL (ref 30.0–36.0)
MCV: 82.9 fL (ref 78.0–100.0)
Monocytes Absolute: 0.5 10*3/uL (ref 0.1–1.0)
Monocytes Relative: 5 %
Neutro Abs: 7.8 10*3/uL — ABNORMAL HIGH (ref 1.7–7.7)
Neutrophils Relative %: 71 %
Platelets: 218 10*3/uL (ref 150–400)
RBC: 3.57 MIL/uL — ABNORMAL LOW (ref 3.87–5.11)
RDW: 17.8 % — ABNORMAL HIGH (ref 11.5–15.5)
WBC: 10.9 10*3/uL — ABNORMAL HIGH (ref 4.0–10.5)

## 2017-12-24 LAB — COMPREHENSIVE METABOLIC PANEL
ALT: 34 U/L (ref 14–54)
AST: 29 U/L (ref 15–41)
Albumin: 3.4 g/dL — ABNORMAL LOW (ref 3.5–5.0)
Alkaline Phosphatase: 161 U/L — ABNORMAL HIGH (ref 38–126)
Anion gap: 13 (ref 5–15)
BUN: 121 mg/dL — ABNORMAL HIGH (ref 6–20)
CO2: 26 mmol/L (ref 22–32)
Calcium: 8.1 mg/dL — ABNORMAL LOW (ref 8.9–10.3)
Chloride: 95 mmol/L — ABNORMAL LOW (ref 101–111)
Creatinine, Ser: 3.62 mg/dL — ABNORMAL HIGH (ref 0.44–1.00)
GFR calc Af Amer: 15 mL/min — ABNORMAL LOW (ref >60)
GFR calc non Af Amer: 13 mL/min — ABNORMAL LOW (ref >60)
Glucose, Bld: 379 mg/dL — ABNORMAL HIGH (ref 65–99)
Potassium: 3.7 mmol/L (ref 3.5–5.1)
Sodium: 134 mmol/L — ABNORMAL LOW (ref 135–145)
Total Bilirubin: 0.3 mg/dL (ref 0.3–1.2)
Total Protein: 8 g/dL (ref 6.5–8.1)

## 2017-12-24 LAB — URINALYSIS, ROUTINE W REFLEX MICROSCOPIC
Bilirubin Urine: NEGATIVE
Glucose, UA: NEGATIVE mg/dL
Hgb urine dipstick: NEGATIVE
Ketones, ur: NEGATIVE mg/dL
Leukocytes, UA: NEGATIVE
Nitrite: NEGATIVE
Protein, ur: 30 mg/dL — AB
Specific Gravity, Urine: 1.009 (ref 1.005–1.030)
pH: 6 (ref 5.0–8.0)

## 2017-12-24 LAB — BLOOD GAS, VENOUS
Acid-Base Excess: 1.7 mmol/L (ref 0.0–2.0)
Bicarbonate: 25.5 mmol/L (ref 20.0–28.0)
O2 Saturation: 75.7 %
Patient temperature: 98.6
pCO2, Ven: 38.5 mmHg — ABNORMAL LOW (ref 44.0–60.0)
pH, Ven: 7.436 — ABNORMAL HIGH (ref 7.250–7.430)
pO2, Ven: 41.1 mmHg (ref 32.0–45.0)

## 2017-12-24 LAB — POCT GLYCOSYLATED HEMOGLOBIN (HGB A1C)
Est. average glucose Bld gHb Est-mCnc: 283
Hemoglobin A1C: 11.5 % — AB (ref 4.0–5.6)

## 2017-12-24 LAB — CBG MONITORING, ED
Glucose-Capillary: 365 mg/dL — ABNORMAL HIGH (ref 65–99)
Glucose-Capillary: 337 mg/dL — ABNORMAL HIGH (ref 65–99)

## 2017-12-24 LAB — I-STAT CG4 LACTIC ACID, ED: Lactic Acid, Venous: 0.99 mmol/L (ref 0.5–1.9)

## 2017-12-24 LAB — GLUCOSE, POCT (MANUAL RESULT ENTRY): POC Glucose: 408 mg/dl — AB (ref 70–99)

## 2017-12-24 MED ORDER — SODIUM CHLORIDE 0.9 % IV BOLUS
1000.0000 mL | Freq: Once | INTRAVENOUS | Status: AC
  Administered 2017-12-24: 1000 mL via INTRAVENOUS

## 2017-12-24 NOTE — ED Notes (Signed)
This RN attempted IC access x 2 both unsuccessful.

## 2017-12-24 NOTE — ED Notes (Signed)
Pt aware of need for urine sample.  

## 2017-12-24 NOTE — H&P (Signed)
[name] EGB:[id number] DOB: 17-Feb-1956 DOA: 12/24/2017     PCP: [name], PA-C family practice Outpatient Specialists:     [facility]  [place]  Patient arrived to ER on 12/24/17 at 1704  Patient coming from:   home Lives   With family    Chief Complaint:  Chief Complaint  Patient presents with  . Hyperglycemia  . Acute Renal Failure    HPI: [name] is a 62 y.o. female with medical history significant of poorly controlled diabetes, CKD chronic hepatitis C anemia of chronic disease, aortic stenosis, diastolic CHF, CAD, depression, GERD, gout OSA,     Presented with she was seen by her primary care provider today noted to have elevated creatinine and was sent to emergency department for further management She has known history of chronic kidney disease with creatinine baseline around 2. She has been having generalized fatigue and feeling like her legs are giving out on her. She describes severe pain in right great toe similar to prior gout attacks for the past 2 weeks  Reports RUQ pain worse with movement and palpation.  Pt is Sp cholecysteectomy Regarding pertinent Chronic problems: Poorly controlled diabetes hemoglobin A1c 10.7 in May on Metformin,Trulicity, Lantus and Novolog History of CAD status post DES in March 2018 Echogram in 2018 showed grade 2 diastolic dysfunction  Hx of Chronic anemia have accepted blood transfusion in the past.  While in ER:   Following Medications were ordered in ER: Medications  sodium chloride 0.9 % bolus 1,000 mL (has no administration in time range)    Significant initial  Findings: Abnormal Labs Reviewed  COMPREHENSIVE METABOLIC PANEL - Abnormal; Notable for the following components:      Result Value   Sodium 134 (*)    Chloride 95 (*)    Glucose, Bld 379 (*)    BUN 121 (*)    Creatinine, Ser 3.62 (*)    Calcium 8.1 (*)    Albumin 3.4 (*)    Alkaline Phosphatase 161 (*)    GFR calc non  Af Amer 13 (*)    GFR calc Af Amer 15 (*)    All other components within normal limits  CBC WITH DIFFERENTIAL/PLATELET - Abnormal; Notable for the following components:   WBC 10.9 (*)    RBC 3.57 (*)    Hemoglobin 9.2 (*)    HCT 29.6 (*)    MCH 25.8 (*)    RDW 17.8 (*)    Neutro Abs 7.8 (*)    All other components within normal limits  URINALYSIS, ROUTINE W REFLEX MICROSCOPIC - Abnormal; Notable for the following components:   Protein, ur 30 (*)    Bacteria, UA RARE (*)    All other components within normal limits  BLOOD GAS, VENOUS - Abnormal; Notable for the following components:   pH, Ven 7.436 (*)    pCO2, Ven 38.5 (*)    All other components within normal limits  CBG MONITORING, ED - Abnormal; Notable for the following components:   Glucose-Capillary 365 (*)    All other components within normal limits  CBG MONITORING, ED - Abnormal; Notable for the following components:   Glucose-Capillary 337 (*)    All other components within normal limits    VBG 7.436/48.5 Na 134 K 3.7  Cr Up from baseline see below Lab Results  Component Value Date   CREATININE 3.62 (H) 12/24/2017   CREATININE 2.70 (H) 10/11/2017   CREATININE 2.58 (H)  10/10/2017      WBC 10.9   HG/HCT stable,       Component Value Date/Time   HGB 9.2 (L) 12/24/2017 1809   HGB 9.0 (L) 09/01/2017 1655   HCT 29.6 (L) 12/24/2017 1809   HCT 29.2 (L) 09/01/2017 1655    BNP (last 3 results) Recent Labs    07/15/17 1213 09/14/17 0710 10/03/17 1628  BNP 110.1* 195.0* 147.0*    ProBNP (last 3 results) Recent Labs    08/05/17 1557  PROBNP 401*    Lactic Acid, Venous    Component Value Date/Time   LATICACIDVEN 0.99 12/24/2017 1833      UA   no evidence of UTI      ECG:   not ordered     ED Triage Vitals  Enc Vitals Group     BP 12/24/17 1723 (!) 170/83     Pulse Rate 12/24/17 1723 95     Resp 12/24/17 1723 18     Temp 12/24/17 1723 97.6 F (36.4 C)     Temp Source 12/24/17 1723  Oral     SpO2 12/24/17 1723 98 %     Weight --      Height --      Head Circumference --      Peak Flow --      Pain Score 12/24/17 1729 9     Pain Loc --      Pain Edu? --      Excl. in [place]? --   TMAX(24)@       Latest  Blood pressure (!) 161/78, pulse 94, temperature 98.8 F (37.1 C), temperature source Oral, resp. rate 13, SpO2 99 %.    Hospitalist was called for admission for AKI in the setting of hyperglycemia   Review of Systems:    Pertinent positives include:  fatigue,   Constitutional:  No weight loss, night sweats, Fevers, chills,weight loss  HEENT:  No headaches, Difficulty swallowing,Tooth/dental problems,Sore throat,  No sneezing, itching, ear ache, nasal congestion, post nasal drip,  Cardio-vascular:  No chest pain, Orthopnea, PND, anasarca, dizziness, palpitations.no Bilateral lower extremity swelling  GI:  No heartburn, indigestion, abdominal pain, nausea, vomiting, diarrhea, change in bowel habits, loss of appetite, melena, blood in stool, hematemesis Resp:  no shortness of breath at rest. No dyspnea on exertion, No excess mucus, no productive cough, No non-productive cough, No coughing up of blood.No change in color of mucus.No wheezing. Skin:  no rash or lesions. No jaundice GU:  no dysuria, change in color of urine, no urgency or frequency. No straining to urinate.  No flank pain.  Musculoskeletal:  No joint pain or no joint swelling. No decreased range of motion. No back pain.  Psych:  No change in mood or affect. No depression or anxiety. No memory loss.  Neuro: no localizing neurological complaints, no tingling, no weakness, no double vision, no gait abnormality, no slurred speech, no confusion  As per HPI otherwise 10 point review of systems negative.   Past Medical History:   Past Medical History:  Diagnosis Date  . Anemia   . Aortic stenosis    Echo 8/18: mean 13, peak 28, LVOT/AV mean velocity 0.51  . Arthritis   . Asthma    As a  child   . Bronchitis   . CAD (coronary artery disease)    a. 09/2016: 50% Ost 1st Mrg stenosis, 50% 2nd Mrg stenosis, 20% Mid-Cx, 95% Prox LAD, 40% mid-LAD, and 10% dist-LAD stenosis. Staged  PCI with DES to Prox-LAD.   [name] Chronic combined systolic and diastolic CHF (congestive heart failure) ([place]) 2011   echo 2/18: EF 55-60, normal wall motion, grade 2 diastolic dysfunction, trivial AI // echo 3/18: Septal and apical HK, EF 45-50, normal wall motion, trivial AI, mild LAE, PASP 38 // echo 8/18: EF 60-65, normal wall motion, grade 1 diastolic dysfunction, calcified aortic valve leaflets, mild aortic stenosis (mean 13, peak 28, LVOT/AV mean velocity 0.51), mild AI, moderate MAC, mild LAE, trivial TR   . Chronic kidney disease    STAGE 4  . Depression   . Diabetes mellitus Dx 1989  . GERD (gastroesophageal reflux disease)   . Gout   . Hepatitis C Dx 2013  . Hypertension Dx 1989  . Obesity   . Obstructive sleep apnea   . Pancreatitis 2013  . Refusal of blood transfusions as patient is Jehovah's Witness   . Tendinitis   . Tremors of nervous system    LEFT HAND  . Ulcer 2010      Past Surgical History:  Procedure Laterality Date  . APPLICATION OF WOUND VAC Left 08/14/2017   Procedure: APPLICATION OF WOUND VAC Exchange;  Surgeon: Robert Bellow, MD;  Location: [facility];  Service: General;  Laterality: Left;  . CHOLECYSTECTOMY    . CORONARY ANGIOPLASTY     STENT  . CORONARY STENT INTERVENTION N/A 09/18/2016   Procedure: Coronary Stent Intervention;  Surgeon: Troy Sine, MD;  Location: [facility];  Service: Cardiovascular;  Laterality: N/A;  . DRESSING CHANGE UNDER ANESTHESIA Left 08/15/2017   Procedure: exploration of wound for bleeding;  Surgeon: Robert Bellow, MD;  Location: [facility];  Service: General;  Laterality: Left;  . EYE SURGERY    . INCISION AND DRAINAGE ABSCESS Left 08/12/2017   Procedure: INCISION AND DRAINAGE ABSCESS;  Surgeon: Robert Bellow, MD;   Location: [facility];  Service: General;  Laterality: Left;  . KNEE ARTHROSCOPY    . LEFT HEART CATH N/A 09/18/2016   Procedure: Left Heart Cath;  Surgeon: Troy Sine, MD;  Location: [facility] [facility];  Service: Cardiovascular;  Laterality: N/A;  . LEFT HEART CATH AND CORONARY ANGIOGRAPHY N/A 09/16/2016   Procedure: Left Heart Cath and Coronary Angiography;  Surgeon: Burnell Blanks, MD;  Location: [place] [facility];  Service: Cardiovascular;  Laterality: N/A;  . LEFT HEART CATH AND CORONARY ANGIOGRAPHY N/A 04/29/2017   Procedure: LEFT HEART CATH AND CORONARY ANGIOGRAPHY;  Surgeon: Nelva Bush, MD;  Location: [facility] [facility];  Service: Cardiovascular;  Laterality: N/A;  . TUBAL LIGATION    . TUBAL LIGATION      Social History:  Ambulatory  walker       reports that she quit smoking about 37 years ago. She has never used smokeless tobacco. She reports that she drinks alcohol. She reports that she does not use drugs.     Family History:   Family History  Problem Relation Age of Onset  . Colon cancer Mother   . Heart attack Other   . Heart attack Maternal Grandmother   . Hypertension Sister   . Hypertension Brother   . Diabetes Paternal Grandmother   . Breast cancer Neg Hx     Allergies: Allergies  Allergen Reactions  . Shellfish Allergy Anaphylaxis and Swelling  . Diazepam Other (See Comments)    "felt like out of body experience"  . Morphine And Related Itching     Prior to  Admission medications   Medication Sig Start Date End Date Taking? Authorizing Provider  albuterol (PROVENTIL) (2.5 MG/3ML) 0.083% nebulizer solution Take 3 mLs (2.5 mg total) by nebulization every 6 (six) hours as needed for wheezing or shortness of breath. 09/01/17  Yes [name], PA-C  albuterol (VENTOLIN HFA) 108 (90 Base) MCG/ACT inhaler Inhale 2 puffs into the lungs every 4 (four) hours as needed for wheezing or shortness of breath. 09/01/17  Yes [name], PA-C   allopurinol (ZYLOPRIM) 300 MG tablet Take 1 tablet (300 mg total) by mouth daily. 10/14/17  Yes [name], PA-C  aspirin EC 81 MG EC tablet Take 1 tablet (81 mg total) by mouth daily. 09/19/16  Yes Strader, [place] M, PA-C  atorvastatin (LIPITOR) 80 MG tablet Take 1 tablet (80 mg total) by mouth daily at 6 PM. Patient taking differently: Take 80 mg by mouth every morning.  09/24/17  Yes Burnette, Anderson Malta M, PA-C  carvedilol (COREG) 25 MG tablet TAKE 1 TABLET BY MOUTH 2 TIMES DAILY WITH A MEAL. Patient taking differently: Take 25 mg by mouth 2 (two) times daily with a meal.  10/14/17  Yes [name], PA-C  ferrous sulfate 325 (65 FE) MG tablet Take 1 tablet (325 mg total) by mouth 2 (two) times daily with a meal. 10/14/17  Yes Burnette, Anderson Malta M, PA-C  fluticasone (FLONASE) 50 MCG/ACT nasal spray Place 2 sprays into both nostrils daily as needed for allergies or rhinitis. 09/28/17  Yes [name], PA-C  Fluticasone-Salmeterol (ADVAIR DISKUS) 250-50 MCG/DOSE AEPB Inhale 1 puff into the lungs 2 (two) times daily. 09/01/17  Yes [name], PA-C  hydrALAZINE (APRESOLINE) 100 MG tablet Take 100 mg by mouth 3 (three) times daily.  12/17/17  Yes [provider]  hydrOXYzine (ATARAX/VISTARIL) 50 MG tablet Take 1 tablet (50 mg total) by mouth 2 (two) times daily as needed. Patient taking differently: Take 50 mg by mouth 3 (three) times daily.  11/17/17  Yes [name], PA-C  insulin aspart (NOVOLOG) 100 UNIT/ML FlexPen Inject 10 Units into the skin 3 (three) times daily with meals. 11/15/17  Yes [name] M, PA-C  Insulin Glargine (LANTUS SOLOSTAR) 100 UNIT/ML Solostar Pen Inject 48 Units into the skin 2 (two) times daily. 11/04/17  Yes [name], PA-C  isosorbide mononitrate (IMDUR) 30 MG 24 hr tablet TAKE 3 TABLETS(90 MG) BY MOUTH DAILY 10/29/17  Yes [name] M, PA-C  loratadine (CLARITIN) 10 MG tablet Take 1 tablet (10 mg total) by  mouth daily. 09/28/17  Yes [name], PA-C  metolazone (ZAROXOLYN) 2.5 MG tablet Take 1 tablet (2.5 mg total) by mouth daily as needed (swelling). 11/22/17  Yes [name] M, PA-C  montelukast (SINGULAIR) 10 MG tablet Take 1 tablet (10 mg total) by mouth at bedtime. 09/28/17  Yes [name], PA-C  omeprazole (PRILOSEC) 20 MG capsule Take 1 capsule (20 mg total) by mouth daily. 09/24/17  Yes [name] M, PA-C  potassium chloride SA (K-DUR,KLOR-CON) 20 MEQ tablet Take 1 tablet (20 mEq total) by mouth 2 (two) times daily. 11/17/17  Yes [name], PA-C  pregabalin (LYRICA) 300 MG capsule Take 1 capsule (300 mg total) by mouth at bedtime. 11/15/17  Yes [name], PA-C  ticagrelor (BRILINTA) 90 MG TABS tablet Take 1 tablet (90 mg total) by mouth 2 (two) times daily. 10/14/17  Yes [name], PA-C  torsemide (DEMADEX) 20 MG tablet TAKE (2) TABLETS  BY MOUTH TWICE DAILY. 12/17/17  Yes [name], PA-C  valACYclovir (VALTREX) 500 MG tablet Take 1 tablet (500 mg total) by mouth daily. 10/14/17  Yes [name], PA-C  Vitamin D, Ergocalciferol, (DRISDOL) 50000 units CAPS capsule TAKE 1 CAPSULE BY MOUTH ONCE A MONTH 12/08/17  Yes [name] M, PA-C  Blood Glucose Monitoring Suppl (ACCU-CHEK AVIVA) device Use as instructed daily. 04/14/17   Charlott Rakes, MD  Blood Pressure Monitoring (ADULT BLOOD PRESSURE CUFF LG) KIT Check blood pressure once daily 10/14/17   [name], PA-C  docusate sodium (COLACE) 100 MG capsule Take 1 capsule (100 mg total) by mouth 2 (two) times daily as needed for mild constipation. 09/20/17   Gouru, Illene Silver, MD  Dulaglutide (TRULICITY) 6.43 PI/9.5JO SOPN Inject 0.75 mg into the skin once a week. Patient taking differently: Inject 0.75 mg into the skin once a week. Thurs 11/22/17   [name], PA-C  glucose blood (ACCU-CHEK AVIVA) test strip Use as instructed daily 04/14/17   Charlott Rakes, MD   glucose blood (TRUE METRIX BLOOD GLUCOSE TEST) test strip Use as instructed 03/19/17   Barrett, Evelene Croon, PA-C  hydrALAZINE (APRESOLINE) 50 MG tablet Take 1 tablet (50 mg total) by mouth every 8 (eight) hours. Patient not taking: Reported on 12/24/2017 10/11/17   Salary, Holly Bodily D, MD  Insulin Pen Needle (PEN NEEDLES) 30G X 8 MM MISC 1 each by Does not apply route 3 (three) times daily before meals. AC for novolog injections 11/15/17   [name], PA-C  ketotifen (ZADITOR) 0.025 % ophthalmic solution Place 1 drop into both eyes daily as needed (for dry eyes). Patient not taking: Reported on 12/14/2017 09/01/17   [name], PA-C  Lancet Devices [facility]) lancets Use as instructed daily. 04/14/17   Charlott Rakes, MD  MITIGARE 0.6 MG CAPS Take 1 capsule by mouth daily as needed. Patient taking differently: Take 1 capsule by mouth daily as needed (for gout).  10/14/17   [name], PA-C  nitroGLYCERIN (NITROSTAT) 0.4 MG SL tablet Place 1 tablet (0.4 mg total) under the tongue every 5 (five) minutes x 3 doses as needed for chest pain. 03/19/17   Barrett, Evelene Croon, PA-C  gabapentin (NEURONTIN) 100 MG capsule Take 1 capsule (100 mg total) by mouth 3 (three) times daily. 08/19/17 09/01/17  [name], MD   Physical Exam: Blood pressure (!) 161/78, pulse 94, temperature 98.8 F (37.1 C), temperature source Oral, resp. rate 13, SpO2 99 %. 1. General:  in No Acute distress   Chronically ill  -appearing 2. Psychological: Alert and   Oriented 3. Head/ENT:  Dry Mucous Membranes                          Head Non traumatic, neck supple                            Poor Dentition 4. SKIN:  decreased Skin turgor,  Skin clean Dry and intact no rash slight erythema of right toe no warmth appreciated 5. Heart: Regular rate and rhythm systolic  Murmur, no Rub or gallop 6. Lungs:   no wheezes or crackles   7. Abdomen: Soft,  RUQ tender, Non distended obese bowel sounds present 8. Lower  extremities: no clubbing, cyanosis, or edema 9. Neurologically Grossly intact, moving all 4 extremities equally  10. MSK: Normal range of motion  LABS:     Recent Labs  Lab 12/24/17 1809  WBC 10.9*  NEUTROABS 7.8*  HGB 9.2*  HCT 29.6*  MCV 82.9  PLT 659   Basic Metabolic Panel: Recent Labs  Lab 12/24/17 1809  NA 134*  K 3.7  CL 95*  CO2 26  GLUCOSE 379*  BUN 121*  CREATININE 3.62*  CALCIUM 8.1*      Recent Labs  Lab 12/24/17 1809  AST 29  ALT 34  ALKPHOS 161*  BILITOT 0.3  PROT 8.0  ALBUMIN 3.4*   No results for input(s): LIPASE, AMYLASE in the last 168 hours. No results for input(s): AMMONIA in the last 168 hours.    HbA1C: Recent Labs    12/24/17 1512  HGBA1C 11.5*   CBG: Recent Labs  Lab 12/24/17 1735 12/24/17 2216  GLUCAP 365* 337*      Urine analysis:    Component Value Date/Time   COLORURINE YELLOW 12/24/2017 2210   APPEARANCEUR CLEAR 12/24/2017 2210   LABSPEC 1.009 12/24/2017 2210   PHURINE 6.0 12/24/2017 2210   GLUCOSEU NEGATIVE 12/24/2017 2210   HGBUR NEGATIVE 12/24/2017 2210   BILIRUBINUR NEGATIVE 12/24/2017 2210   BILIRUBINUR neg 12/17/2016 1538   KETONESUR NEGATIVE 12/24/2017 2210   PROTEINUR 30 (A) 12/24/2017 2210   UROBILINOGEN 0.2 12/17/2016 1538   UROBILINOGEN 0.2 05/29/2010 2100   NITRITE NEGATIVE 12/24/2017 2210   LEUKOCYTESUR NEGATIVE 12/24/2017 2210       Cultures:    Component Value Date/Time   SDES  09/15/2017 0318    URINE, RANDOM Performed at [facility], [address] [place], [place] [address]    [facility]  09/15/2017 [phone]    NONE Performed at [facility], [address] [place], [place] [address]    CULT  09/15/2017 0318    NO GROWTH Performed at [facility], [place] [address] [place], [place] [address]    REPTSTATUS 09/16/2017 FINAL 09/15/2017 0318     Radiological Exams on Admission: No results found.  Chart has been reviewed    Assessment/Plan  62  y.o. female with medical history significant of poorly controlled diabetes, CKD chronic hepatitis C anemia of chronic disease, aortic stenosis, diastolic CHF, CAD, depression, GERD, gout OSA, Jehovah's Witness no blood transfusions  Admitted for  AKI in the setting of hyperglycemia   Present on Admission: . AKI (acute kidney injury) ([place]) hold torsemide and metolazone gently rehydrate obtain urine electrolytes . Anemia in stage 3 chronic kidney disease (HCC) -check anemia panel at this point no indication for transfusion . CAD (coronary artery disease) -  - chronic, continue aspirin  and statin  and beta blocker   . Chronic diastolic heart failure (HCC) - - currently appears to be slightly on the dry side, hold home diuretics for tonight and restart when appears euvolemic, carefuly follow fluid status and Cr  . CKD (chronic kidney disease) stage 4, GFR 15-29 ml/min (HCC) -worsening from baseline avoid nephrotoxic medications monitor kidney status hold diuretics and gently rehydrate . Diabetes mellitus type 2, uncontrolled, with complications (HCC)-poorly controlled order diabetes coordinator consult may need increased dose of Lantus but given worsening kidney function will be careful secondary to probably worsening insulin clearance . Essential hypertension stable continue medications . Hyperglycemia -long-standing poor diabetes control may benefit from follow-up with endocrinology . Obesity stable . Diabetic hyperosmolar non-ketotic state ([place]) -given hyperglycemia we will gently rehydrate restart home dose Lantus increase likely to 50 twice daily and monitoring . Diabetic neuropathy (  [place]) continue Lyrica . Asthma stable continue home medications . Gout -right foot pain and redness of the toe similar to prior gout attacks will hold off on NSAIDs given worsening kidney function also hold off on steroids given worsening hyperglycemia give renally dose colchicine and reassess Extensive right knee  pain-  will image OSA states not using CPAP Other plan as per orders.  DVT prophylaxis:   Lovenox     Code Status:  FULL CODE as per patient   I had personally discussed CODE STATUS with patient    Family Communication:   Family not at  Bedside    Disposition Plan:    To home once workup is complete and patient is stable                       Would benefit from PT/OT eval prior to DC  ordered                        Diabetes coordinator   consulted                          Consults called: none  Admission status:   inpatient     Level of care   tele             Toy Baker 12/25/2017, 12:13 AM    [facility]  Pager [phone]   after 2 AM please page floor coverage PA If 7AM-7PM, please contact the day team taking care of the patient  [email or web address]  Password [email or web address]

## 2017-12-24 NOTE — Progress Notes (Signed)
Patient: [name] Female    DOB: 1955/12/26   62 y.o.   MRN: [id number] Visit Date: 12/24/2017  Today's Provider: [name], PA-C   Chief Complaint  Patient presents with  . Follow-up    2 weeks   Subjective:    HPI Patient here today to follow-up from last office visit 12/03/17. Patient's A1C was 10.7, random glucose was 339. Patient takes Metformin,Trulicity, Lantus and Novolog with no side effect. Patient reports that he legs are giving out. Reports that on Monday when she went to see the Neurologist that her legs gave out and they locked on her yesterday and it hurt. She also states that it feels like they are getting" paralyzed".  When she was seen by Neurology she had labs drawn on 12/10/17. She had a creatinine of 3.6 up from previous labs on 10/11/17 where her creatinine was 2.70. She is followed by Dr. Juleen China at [facility] but is not scheduled to see him until July.   Over the last month, especially in the last 2-3 weeks, she has noticed worsening tremors in her hands and now having issues with "numbness" and "weakness" in her legs. Per Neurology, it was felt to be from poorly controlled diabetes. Patient reports medication compliance. Her labs today in office now reveal an A1c of 11.5 and random glucose of 408. She is scheduled to see Endocrinology, Dr. Gabriel Carina, on Monday 12/27/17.      Allergies  Allergen Reactions  . Shellfish Allergy Anaphylaxis and Swelling  . Diazepam Other (See Comments)    "felt like out of body experience"  . Morphine And Related Itching     Current Outpatient Medications:  .  albuterol (PROVENTIL) (2.5 MG/3ML) 0.083% nebulizer solution, Take 3 mLs (2.5 mg total) by nebulization every 6 (six) hours as needed for wheezing or shortness of breath., Disp: 150 mL, Rfl: 1 .  albuterol (VENTOLIN HFA) 108 (90 Base) MCG/ACT inhaler, Inhale 2 puffs into the lungs every 4 (four) hours as needed for wheezing or shortness of  breath., Disp: 54 g, Rfl: 3 .  allopurinol (ZYLOPRIM) 300 MG tablet, Take 1 tablet (300 mg total) by mouth daily., Disp: 90 tablet, Rfl: 1 .  aspirin EC 81 MG EC tablet, Take 1 tablet (81 mg total) by mouth daily., Disp: , Rfl:  .  atorvastatin (LIPITOR) 80 MG tablet, Take 1 tablet (80 mg total) by mouth daily at 6 PM., Disp: 90 tablet, Rfl: 3 .  Blood Glucose Monitoring Suppl (ACCU-CHEK AVIVA) device, Use as instructed daily., Disp: 1 each, Rfl: 0 .  Blood Pressure Monitoring (ADULT BLOOD PRESSURE CUFF LG) KIT, Check blood pressure once daily, Disp: 1 each, Rfl: 0 .  carvedilol (COREG) 25 MG tablet, TAKE 1 TABLET BY MOUTH 2 TIMES DAILY WITH A MEAL. (Patient taking differently: Take 25 mg by mouth 2 (two) times daily with a meal. ), Disp: 180 tablet, Rfl: 3 .  Difluprednate (DUREZOL) 0.05 % EMUL, , Disp: , Rfl:  .  docusate sodium (COLACE) 100 MG capsule, Take 1 capsule (100 mg total) by mouth 2 (two) times daily as needed for mild constipation., Disp: 30 capsule, Rfl: 0 .  Dulaglutide (TRULICITY) 9.47 ML/4.6TK SOPN, Inject 0.75 mg into the skin once a week. (Patient taking differently: Inject 0.75 mg into the skin every Tuesday. ), Disp: 4 pen, Rfl: 3 .  ferrous sulfate 325 (65 FE) MG tablet, Take 1 tablet (325 mg total) by mouth 2 (  two) times daily with a meal., Disp: 180 tablet, Rfl: 3 .  fluticasone (FLONASE) 50 MCG/ACT nasal spray, Place 2 sprays into both nostrils daily as needed for allergies or rhinitis., Disp: 48 g, Rfl: 1 .  Fluticasone-Salmeterol (ADVAIR DISKUS) 250-50 MCG/DOSE AEPB, Inhale 1 puff into the lungs 2 (two) times daily., Disp: 180 each, Rfl: 3 .  furosemide (LASIX) 40 MG tablet, Take by mouth., Disp: , Rfl:  .  glucose blood (ACCU-CHEK AVIVA) test strip, Use as instructed daily, Disp: 100 each, Rfl: 12 .  glucose blood (TRUE METRIX BLOOD GLUCOSE TEST) test strip, Use as instructed, Disp: 100 each, Rfl: 12 .  hydrALAZINE (APRESOLINE) 50 MG tablet, Take 1 tablet (50 mg total) by  mouth every 8 (eight) hours., Disp: 90 tablet, Rfl: 0 .  HYDROcodone-acetaminophen (NORCO/VICODIN) 5-325 MG tablet, TAKE 1-2 TABLET EVERY 4-6 HOURS AS NEEDED FOR PAIN, Disp: , Rfl:  .  hydrOXYzine (ATARAX/VISTARIL) 50 MG tablet, Take 1 tablet (50 mg total) by mouth 2 (two) times daily as needed. (Patient taking differently: Take 50 mg by mouth 3 (three) times daily. ), Disp: 60 tablet, Rfl: 0 .  insulin aspart (NOVOLOG) 100 UNIT/ML FlexPen, Inject 10 Units into the skin 3 (three) times daily with meals., Disp: 15 mL, Rfl: 3 .  Insulin Glargine (LANTUS SOLOSTAR) 100 UNIT/ML Solostar Pen, Inject 48 Units into the skin 2 (two) times daily., Disp: 24 mL, Rfl: 5 .  Insulin Pen Needle (PEN NEEDLES) 30G X 8 MM MISC, 1 each by Does not apply route 3 (three) times daily before meals. AC for novolog injections, Disp: 100 each, Rfl: 11 .  isosorbide mononitrate (IMDUR) 30 MG 24 hr tablet, TAKE 3 TABLETS(90 MG) BY MOUTH DAILY, Disp: 270 tablet, Rfl: 1 .  Lancet Devices (ACCU-CHEK SOFTCLIX) lancets, Use as instructed daily., Disp: 1 each, Rfl: 5 .  loratadine (CLARITIN) 10 MG tablet, Take 1 tablet (10 mg total) by mouth daily., Disp: 90 tablet, Rfl: 1 .  metolazone (ZAROXOLYN) 2.5 MG tablet, Take 1 tablet (2.5 mg total) by mouth daily as needed (swelling)., Disp: 90 tablet, Rfl: 1 .  MITIGARE 0.6 MG CAPS, Take 1 capsule by mouth daily as needed. (Patient taking differently: Take 1 capsule by mouth daily as needed (for gout). ), Disp: 90 capsule, Rfl: 1 .  montelukast (SINGULAIR) 10 MG tablet, Take 1 tablet (10 mg total) by mouth at bedtime., Disp: 90 tablet, Rfl: 1 .  nepafenac (ILEVRO) 0.3 % ophthalmic suspension, , Disp: , Rfl:  .  nitroGLYCERIN (NITROSTAT) 0.4 MG SL tablet, Place 1 tablet (0.4 mg total) under the tongue every 5 (five) minutes x 3 doses as needed for chest pain., Disp: 25 tablet, Rfl: 12 .  omeprazole (PRILOSEC) 20 MG capsule, Take 1 capsule (20 mg total) by mouth daily., Disp: 90 capsule, Rfl:  3 .  oxyCODONE-acetaminophen (PERCOCET/ROXICET) 5-325 MG tablet, TAKE 1 TABLET BY MOUTH EVERY 6 HOURS AS NEEDED FOR SEVERE PAIN, Disp: , Rfl:  .  potassium chloride SA (K-DUR,KLOR-CON) 20 MEQ tablet, Take 1 tablet (20 mEq total) by mouth 2 (two) times daily., Disp: 60 tablet, Rfl: 5 .  pregabalin (LYRICA) 300 MG capsule, Take 1 capsule (300 mg total) by mouth at bedtime., Disp: 90 capsule, Rfl: 1 .  ticagrelor (BRILINTA) 90 MG TABS tablet, Take 1 tablet (90 mg total) by mouth 2 (two) times daily., Disp: 180 tablet, Rfl: 3 .  torsemide (DEMADEX) 20 MG tablet, TAKE (2) TABLETS BY MOUTH TWICE DAILY., Disp: 120 tablet,  Rfl: 5 .  valACYclovir (VALTREX) 500 MG tablet, Take 1 tablet (500 mg total) by mouth daily., Disp: 90 tablet, Rfl: 3 .  Vitamin D, Ergocalciferol, (DRISDOL) 50000 units CAPS capsule, TAKE 1 CAPSULE BY MOUTH ONCE A MONTH, Disp: 3 capsule, Rfl: 3 .  ketotifen (ZADITOR) 0.025 % ophthalmic solution, Place 1 drop into both eyes daily as needed (for dry eyes). (Patient not taking: Reported on 12/14/2017), Disp: 10 mL, Rfl: 2 .  meclizine (ANTIVERT) 25 MG tablet, Take 1 tablet (25 mg total) by mouth 3 (three) times daily as needed for dizziness. (Patient not taking: Reported on 12/14/2017), Disp: 30 tablet, Rfl: 3 No current facility-administered medications for this visit.   Facility-Administered Medications Ordered in Other Visits:  .  0.9 %  sodium chloride infusion, , Intravenous, Continuous, Vanga, Tally Due, MD .  0.9 %  sodium chloride infusion, , Intravenous, Continuous, Piscitello, Precious Haws, MD .  moxifloxacin (VIGAMOX) 0.5 % ophthalmic solution 1 drop, 1 drop, Right Eye, PRN, Birder Robson, MD  Review of Systems  Constitutional: Positive for fatigue.  Respiratory: Negative.   Cardiovascular: Negative.   Gastrointestinal: Negative.   Neurological: Positive for tremors, weakness and numbness.    Social History   Tobacco Use  . Smoking status: Former Smoker    Last attempt  to quit: 10/25/1980    Years since quitting: 37.1  . Smokeless tobacco: Never Used  . Tobacco comment: quit smoking in 1982  Substance Use Topics  . Alcohol use: Yes    Comment: 3 times in last year   Objective:   BP 140/80 (BP Location: Right Wrist, Patient Position: Sitting, Cuff Size: Normal)   Pulse 99   Temp 98.4 F (36.9 C) (Oral)   Resp 18   Wt 293 lb (132.9 kg)   SpO2 98%   BMI 44.55 kg/m    Physical Exam  Constitutional: She is oriented to person, place, and time. Vital signs are normal. She appears well-developed and well-nourished. She is sleeping. She is easily aroused. No distress.  Neck: Normal range of motion. Neck supple. No JVD present. No tracheal deviation present. No thyromegaly present.  Cardiovascular: Normal rate and regular rhythm. Exam reveals no gallop and no friction rub.  Murmur heard. Pulmonary/Chest: Effort normal and breath sounds normal. No respiratory distress. She has no wheezes. She has no rales.  Lymphadenopathy:    She has no cervical adenopathy.  Neurological: She is alert, oriented to person, place, and time and easily aroused.  Resting tremors noted of UE bilaterally, worsened since previous visit.  Skin: She is not diaphoretic.  Vitals reviewed.      Assessment & Plan:     1. Acute kidney injury superimposed on chronic kidney disease ([place]) Due to worsening kidney function and uncontrolled T2DM patient was referred to [facility] (patient preference). I did attempt to call her nephrologist first but there was no answer at their office. Fear of elevated ammonia or phosphorus as cause of worsening tremors and weakness. Patient will need aggressive diabetic management to lower blood sugar and fluids as she appears slightly dehydrated today. Her daughter accompanies her today and will transport via private vehicle.   2. Diabetes mellitus type 2, uncontrolled, with complications ([place]) See above medical treatment plan. - POCT glucose  (manual entry) - POCT glycosylated hemoglobin (Hb A1C)  3. Tremor of both hands See above medical treatment plan.       [name], PA-C  [facility]

## 2017-12-24 NOTE — ED Notes (Signed)
Attempted IV x2. Was able to obtain blood, but could not thread the needle. Will ask another RN to attempt.

## 2017-12-24 NOTE — ED Notes (Signed)
Per PA from [facility]-coming for an acute kidney injury-states creatine 3.0

## 2017-12-24 NOTE — ED Provider Notes (Signed)
[facility] DEPT Provider Note   CSN: [id number] Arrival date & time: 12/24/17  1704     History   Chief Complaint Chief Complaint  Patient presents with  . Hyperglycemia  . Acute Renal Failure    HPI [name] is a 62 y.o. female.  HPI Patient presents to the emergency department with elevated creatinine and blood sugar from her doctor's office.  The patient states she was seen at her doctor's office who felt she need to be admitted to the hospital due to the changes in her creatinine.  She does have chronic renal insufficiency.  The patient states that she has been feeling little bit weaker and more fatigued over the last couple weeks.  The patient denies chest pain, shortness of breath, headache,blurred vision, neck pain, fever, cough,  numbness, dizziness, anorexia, edema, abdominal pain, nausea, vomiting, diarrhea, rash, back pain, dysuria, hematemesis, bloody stool, near syncope, or syncope. Past Medical History:  Diagnosis Date  . Anemia   . Aortic stenosis    Echo 8/18: mean 13, peak 28, LVOT/AV mean velocity 0.51  . Arthritis   . Asthma    As a child   . Bronchitis   . CAD (coronary artery disease)    a. 09/2016: 50% Ost 1st Mrg stenosis, 50% 2nd Mrg stenosis, 20% Mid-Cx, 95% Prox LAD, 40% mid-LAD, and 10% dist-LAD stenosis. Staged PCI with DES to Prox-LAD.   [name] Chronic combined systolic and diastolic CHF (congestive heart failure) ([place]) 2011   echo 2/18: EF 55-60, normal wall motion, grade 2 diastolic dysfunction, trivial AI // echo 3/18: Septal and apical HK, EF 45-50, normal wall motion, trivial AI, mild LAE, PASP 38 // echo 8/18: EF 60-65, normal wall motion, grade 1 diastolic dysfunction, calcified aortic valve leaflets, mild aortic stenosis (mean 13, peak 28, LVOT/AV mean velocity 0.51), mild AI, moderate MAC, mild LAE, trivial TR   . Chronic kidney disease    STAGE 4  . Depression   . Diabetes mellitus Dx 1989  . GERD  (gastroesophageal reflux disease)   . Gout   . Hepatitis C Dx 2013  . Hypertension Dx 1989  . Obesity   . Obstructive sleep apnea   . Pancreatitis 2013  . Refusal of blood transfusions as patient is Jehovah's Witness   . Tendinitis   . Tremors of nervous system    LEFT HAND  . Ulcer 2010    Patient Active Problem List   Diagnosis Date Noted  . AKI (acute kidney injury) ([place]) 12/24/2017  . Chronic diastolic heart failure ([place]) 09/30/2017  . Lymphedema 09/30/2017  . Aortic stenosis   . Chronic pain of right knee 02/12/2017  . Chronic gout due to renal impairment of multiple sites without tophus 02/12/2017  . Esophageal dysphagia 02/12/2017  . Osteoarthritis 01/22/2017  . Diabetic hyperosmolar non-ketotic state ([place]) 12/13/2016  . CAD (coronary artery disease) 12/13/2016  . Hyperlipidemia 12/13/2016  . Hyperphosphatemia 12/13/2016  . Asthma 11/29/2015  . Depression 09/05/2015  . GERD (gastroesophageal reflux disease) 03/25/2015  . Environmental allergies 03/14/2015  . Hep C w/o coma, chronic ([place]) 01/31/2015  . Diabetic neuropathy ([place]) 01/31/2015  . OSA (obstructive sleep apnea) 01/01/2015  . Poor dentition 11/13/2014  . Essential hypertension 10/08/2014  . Obesity 10/08/2014  . CKD (chronic kidney disease) stage 4, GFR 15-29 ml/min ([facility]) 10/08/2014  . Diabetes mellitus type 2, uncontrolled, with complications ([place]) 74/06/8785    Class: Chronic  . Anemia in stage 3 chronic kidney disease (  [place]) 03/25/2012    Past Surgical History:  Procedure Laterality Date  . APPLICATION OF WOUND VAC Left 08/14/2017   Procedure: APPLICATION OF WOUND VAC Exchange;  Surgeon: Robert Bellow, MD;  Location: [facility] ORS;  Service: General;  Laterality: Left;  . CHOLECYSTECTOMY    . CORONARY ANGIOPLASTY     STENT  . CORONARY STENT INTERVENTION N/A 09/18/2016   Procedure: Coronary Stent Intervention;  Surgeon: Troy Sine, MD;  Location: [facility];  Service: Cardiovascular;   Laterality: N/A;  . DRESSING CHANGE UNDER ANESTHESIA Left 08/15/2017   Procedure: exploration of wound for bleeding;  Surgeon: Robert Bellow, MD;  Location: [facility] ORS;  Service: General;  Laterality: Left;  . EYE SURGERY    . INCISION AND DRAINAGE ABSCESS Left 08/12/2017   Procedure: INCISION AND DRAINAGE ABSCESS;  Surgeon: Robert Bellow, MD;  Location: [facility] ORS;  Service: General;  Laterality: Left;  . KNEE ARTHROSCOPY    . LEFT HEART CATH N/A 09/18/2016   Procedure: Left Heart Cath;  Surgeon: Troy Sine, MD;  Location: [facility];  Service: Cardiovascular;  Laterality: N/A;  . LEFT HEART CATH AND CORONARY ANGIOGRAPHY N/A 09/16/2016   Procedure: Left Heart Cath and Coronary Angiography;  Surgeon: Burnell Blanks, MD;  Location: [place];  Service: Cardiovascular;  Laterality: N/A;  . LEFT HEART CATH AND CORONARY ANGIOGRAPHY N/A 04/29/2017   Procedure: LEFT HEART CATH AND CORONARY ANGIOGRAPHY;  Surgeon: Nelva Bush, MD;  Location: [facility];  Service: Cardiovascular;  Laterality: N/A;  . TUBAL LIGATION    . TUBAL LIGATION       OB History    Gravida  4   Para  3   Term  3   Preterm      AB  1   Living  3     SAB  1   TAB      Ectopic      Multiple      Live Births  3            Home Medications    Prior to Admission medications   Medication Sig Start Date End Date Taking? Authorizing Provider  albuterol (PROVENTIL) (2.5 MG/3ML) 0.083% nebulizer solution Take 3 mLs (2.5 mg total) by nebulization every 6 (six) hours as needed for wheezing or shortness of breath. 09/01/17  Yes [name], PA-C  albuterol (VENTOLIN HFA) 108 (90 Base) MCG/ACT inhaler Inhale 2 puffs into the lungs every 4 (four) hours as needed for wheezing or shortness of breath. 09/01/17  Yes [name], PA-C  allopurinol (ZYLOPRIM) 300 MG tablet Take 1 tablet (300 mg total) by mouth daily. 10/14/17  Yes [name], PA-C  aspirin  EC 81 MG EC tablet Take 1 tablet (81 mg total) by mouth daily. 09/19/16  Yes Strader, [place] M, PA-C  atorvastatin (LIPITOR) 80 MG tablet Take 1 tablet (80 mg total) by mouth daily at 6 PM. Patient taking differently: Take 80 mg by mouth every morning.  09/24/17  Yes Burnette, Anderson Malta M, PA-C  carvedilol (COREG) 25 MG tablet TAKE 1 TABLET BY MOUTH 2 TIMES DAILY WITH A MEAL. Patient taking differently: Take 25 mg by mouth 2 (two) times daily with a meal.  10/14/17  Yes [name], PA-C  ferrous sulfate 325 (65 FE) MG tablet Take 1 tablet (325 mg total) by mouth 2 (two) times daily with a meal. 10/14/17  Yes [name]  M, PA-C  fluticasone (FLONASE) 50 MCG/ACT nasal spray Place 2 sprays into both nostrils daily as needed for allergies or rhinitis. 09/28/17  Yes [name], PA-C  Fluticasone-Salmeterol (ADVAIR DISKUS) 250-50 MCG/DOSE AEPB Inhale 1 puff into the lungs 2 (two) times daily. 09/01/17  Yes [name], PA-C  hydrALAZINE (APRESOLINE) 100 MG tablet Take 100 mg by mouth 3 (three) times daily.  12/17/17  Yes [provider]  hydrOXYzine (ATARAX/VISTARIL) 50 MG tablet Take 1 tablet (50 mg total) by mouth 2 (two) times daily as needed. Patient taking differently: Take 50 mg by mouth 3 (three) times daily.  11/17/17  Yes [name], PA-C  insulin aspart (NOVOLOG) 100 UNIT/ML FlexPen Inject 10 Units into the skin 3 (three) times daily with meals. 11/15/17  Yes [name] M, PA-C  Insulin Glargine (LANTUS SOLOSTAR) 100 UNIT/ML Solostar Pen Inject 48 Units into the skin 2 (two) times daily. 11/04/17  Yes [name], PA-C  isosorbide mononitrate (IMDUR) 30 MG 24 hr tablet TAKE 3 TABLETS(90 MG) BY MOUTH DAILY 10/29/17  Yes [name] M, PA-C  loratadine (CLARITIN) 10 MG tablet Take 1 tablet (10 mg total) by mouth daily. 09/28/17  Yes [name], PA-C  metolazone (ZAROXOLYN) 2.5 MG tablet Take 1 tablet (2.5 mg total) by mouth  daily as needed (swelling). 11/22/17  Yes [name] M, PA-C  montelukast (SINGULAIR) 10 MG tablet Take 1 tablet (10 mg total) by mouth at bedtime. 09/28/17  Yes [name], PA-C  omeprazole (PRILOSEC) 20 MG capsule Take 1 capsule (20 mg total) by mouth daily. 09/24/17  Yes [name] M, PA-C  potassium chloride SA (K-DUR,KLOR-CON) 20 MEQ tablet Take 1 tablet (20 mEq total) by mouth 2 (two) times daily. 11/17/17  Yes [name], PA-C  pregabalin (LYRICA) 300 MG capsule Take 1 capsule (300 mg total) by mouth at bedtime. 11/15/17  Yes [name], PA-C  ticagrelor (BRILINTA) 90 MG TABS tablet Take 1 tablet (90 mg total) by mouth 2 (two) times daily. 10/14/17  Yes [name], PA-C  torsemide (DEMADEX) 20 MG tablet TAKE (2) TABLETS BY MOUTH TWICE DAILY. 12/17/17  Yes [name], PA-C  valACYclovir (VALTREX) 500 MG tablet Take 1 tablet (500 mg total) by mouth daily. 10/14/17  Yes [name], PA-C  Vitamin D, Ergocalciferol, (DRISDOL) 50000 units CAPS capsule TAKE 1 CAPSULE BY MOUTH ONCE A MONTH 12/08/17  Yes [name] M, PA-C  Blood Glucose Monitoring Suppl (ACCU-CHEK AVIVA) device Use as instructed daily. 04/14/17   Charlott Rakes, MD  Blood Pressure Monitoring (ADULT BLOOD PRESSURE CUFF LG) KIT Check blood pressure once daily 10/14/17   [name], PA-C  docusate sodium (COLACE) 100 MG capsule Take 1 capsule (100 mg total) by mouth 2 (two) times daily as needed for mild constipation. 09/20/17   Gouru, Illene Silver, MD  Dulaglutide (TRULICITY) 4.68 EH/2.1YY SOPN Inject 0.75 mg into the skin once a week. Patient taking differently: Inject 0.75 mg into the skin once a week. Thurs 11/22/17   [name], PA-C  glucose blood (ACCU-CHEK AVIVA) test strip Use as instructed daily 04/14/17   Charlott Rakes, MD  glucose blood (TRUE METRIX BLOOD GLUCOSE TEST) test strip Use as instructed 03/19/17   Barrett, Evelene Croon, PA-C  hydrALAZINE  (APRESOLINE) 50 MG tablet Take 1 tablet (50 mg total) by mouth every 8 (eight) hours. Patient not taking: Reported on 12/24/2017 10/11/17   Salary, Avel Peace, MD  Insulin Pen Needle (PEN NEEDLES) 30G X 8 MM MISC 1 each by Does not apply route 3 (three) times daily before meals. AC for novolog injections 11/15/17   [name], PA-C  ketotifen (ZADITOR) 0.025 % ophthalmic solution Place 1 drop into both eyes daily as needed (for dry eyes). Patient not taking: Reported on 12/14/2017 09/01/17   [name], PA-C  Lancet Devices [facility]) lancets Use as instructed daily. 04/14/17   Charlott Rakes, MD  MITIGARE 0.6 MG CAPS Take 1 capsule by mouth daily as needed. Patient taking differently: Take 1 capsule by mouth daily as needed (for gout).  10/14/17   [name], PA-C  nitroGLYCERIN (NITROSTAT) 0.4 MG SL tablet Place 1 tablet (0.4 mg total) under the tongue every 5 (five) minutes x 3 doses as needed for chest pain. 03/19/17   Barrett, Evelene Croon, PA-C  gabapentin (NEURONTIN) 100 MG capsule Take 1 capsule (100 mg total) by mouth 3 (three) times daily. 08/19/17 09/01/17  [name], MD    Family History Family History  Problem Relation Age of Onset  . Colon cancer Mother   . Heart attack Other   . Heart attack Maternal Grandmother   . Hypertension Sister   . Hypertension Brother   . Diabetes Paternal Grandmother   . Breast cancer Neg Hx     Social History Social History   Tobacco Use  . Smoking status: Former Smoker    Last attempt to quit: 10/25/1980    Years since quitting: 37.1  . Smokeless tobacco: Never Used  . Tobacco comment: quit smoking in 1982  Substance Use Topics  . Alcohol use: Yes    Comment: 3 times in last year  . Drug use: No    Types: "Crack" cocaine    Comment: 08/21/2016 "clean since 05/1998"     Allergies   Shellfish allergy; Diazepam; and Morphine and related   Review of Systems Review of Systems All other systems negative except as  documented in the HPI. All pertinent positives and negatives as reviewed in the HPI. Physical Exam Updated Vital Signs BP (!) 161/78 (BP Location: Right Arm)   Pulse 94   Temp 98.8 F (37.1 C) (Oral)   Resp 13   SpO2 99%   Physical Exam  Constitutional: She is oriented to person, place, and time. She appears well-developed and well-nourished. No distress.  HENT:  Head: Normocephalic and atraumatic.  Mouth/Throat: Oropharynx is clear and moist.  Eyes: Pupils are equal, round, and reactive to light.  Neck: Normal range of motion. Neck supple.  Cardiovascular: Normal rate, regular rhythm and normal heart sounds. Exam reveals no gallop and no friction rub.  No murmur heard. Pulmonary/Chest: Effort normal and breath sounds normal. No respiratory distress. She has no wheezes.  Abdominal: Soft. Bowel sounds are normal. She exhibits no distension and no mass. There is no tenderness. There is no guarding.  Neurological: She is alert and oriented to person, place, and time. She exhibits normal muscle tone. Coordination normal.  Skin: Skin is warm and dry. Capillary refill takes less than 2 seconds. No rash noted. No erythema.  Psychiatric: She has a normal mood and affect. Her behavior is normal.  Nursing note and vitals reviewed.    ED Treatments / Results  Labs (all labs ordered are listed, but only abnormal results are displayed) Labs Reviewed  COMPREHENSIVE METABOLIC PANEL - Abnormal; Notable for the following components:      Result Value   Sodium 134 (*)  Chloride 95 (*)    Glucose, Bld 379 (*)    BUN 121 (*)    Creatinine, Ser 3.62 (*)    Calcium 8.1 (*)    Albumin 3.4 (*)    Alkaline Phosphatase 161 (*)    GFR calc non Af Amer 13 (*)    GFR calc Af Amer 15 (*)    All other components within normal limits  CBC WITH DIFFERENTIAL/PLATELET - Abnormal; Notable for the following components:   WBC 10.9 (*)    RBC 3.57 (*)    Hemoglobin 9.2 (*)    HCT 29.6 (*)    MCH 25.8  (*)    RDW 17.8 (*)    Neutro Abs 7.8 (*)    All other components within normal limits  URINALYSIS, ROUTINE W REFLEX MICROSCOPIC - Abnormal; Notable for the following components:   Protein, ur 30 (*)    Bacteria, UA RARE (*)    All other components within normal limits  BLOOD GAS, VENOUS - Abnormal; Notable for the following components:   pH, Ven 7.436 (*)    pCO2, Ven 38.5 (*)    All other components within normal limits  CBG MONITORING, ED - Abnormal; Notable for the following components:   Glucose-Capillary 365 (*)    All other components within normal limits  CBG MONITORING, ED - Abnormal; Notable for the following components:   Glucose-Capillary 337 (*)    All other components within normal limits  I-STAT CG4 LACTIC ACID, ED    EKG None  Radiology No results found.  Procedures Procedures (including critical care time)  Medications Ordered in ED Medications  sodium chloride 0.9 % bolus 1,000 mL (1,000 mLs Intravenous New Bag/Given 12/24/17 2300)     Initial Impression / Assessment and Plan / ED Course  I have reviewed the triage vital signs and the nursing notes.  Pertinent labs & imaging results that were available during my care of the patient were reviewed by me and considered in my medical decision making (see chart for details).     I spoke with the [facility] who will admit the patient for further evaluation and care of her fairly significant increase in her creatinine.  Patient will be slowly given IV fluids.  Final Clinical Impressions(s) / ED Diagnoses   Final diagnoses:  None    ED Discharge Orders    None       Rebeca Allegra 12/24/17 2355    Hayden Rasmussen, MD 12/25/17 1048

## 2017-12-24 NOTE — ED Triage Notes (Signed)
Patient reports that her PCP called and told her to come to the ED for admission due to hyperglycemia and renal failure.

## 2017-12-25 ENCOUNTER — Inpatient Hospital Stay (HOSPITAL_COMMUNITY): Payer: Medicare Other

## 2017-12-25 DIAGNOSIS — I2510 Atherosclerotic heart disease of native coronary artery without angina pectoris: Secondary | ICD-10-CM | POA: Diagnosis present

## 2017-12-25 DIAGNOSIS — J45909 Unspecified asthma, uncomplicated: Secondary | ICD-10-CM | POA: Diagnosis present

## 2017-12-25 DIAGNOSIS — Z833 Family history of diabetes mellitus: Secondary | ICD-10-CM | POA: Diagnosis not present

## 2017-12-25 DIAGNOSIS — Z794 Long term (current) use of insulin: Secondary | ICD-10-CM

## 2017-12-25 DIAGNOSIS — Z91013 Allergy to seafood: Secondary | ICD-10-CM | POA: Diagnosis not present

## 2017-12-25 DIAGNOSIS — Z79899 Other long term (current) drug therapy: Secondary | ICD-10-CM | POA: Diagnosis not present

## 2017-12-25 DIAGNOSIS — M109 Gout, unspecified: Secondary | ICD-10-CM | POA: Diagnosis present

## 2017-12-25 DIAGNOSIS — N183 Chronic kidney disease, stage 3 (moderate): Secondary | ICD-10-CM | POA: Diagnosis not present

## 2017-12-25 DIAGNOSIS — D631 Anemia in chronic kidney disease: Secondary | ICD-10-CM | POA: Diagnosis not present

## 2017-12-25 DIAGNOSIS — E1122 Type 2 diabetes mellitus with diabetic chronic kidney disease: Secondary | ICD-10-CM | POA: Diagnosis present

## 2017-12-25 DIAGNOSIS — I130 Hypertensive heart and chronic kidney disease with heart failure and stage 1 through stage 4 chronic kidney disease, or unspecified chronic kidney disease: Secondary | ICD-10-CM | POA: Diagnosis present

## 2017-12-25 DIAGNOSIS — N184 Chronic kidney disease, stage 4 (severe): Secondary | ICD-10-CM

## 2017-12-25 DIAGNOSIS — N179 Acute kidney failure, unspecified: Secondary | ICD-10-CM | POA: Diagnosis not present

## 2017-12-25 DIAGNOSIS — Z8249 Family history of ischemic heart disease and other diseases of the circulatory system: Secondary | ICD-10-CM | POA: Diagnosis not present

## 2017-12-25 DIAGNOSIS — Z955 Presence of coronary angioplasty implant and graft: Secondary | ICD-10-CM | POA: Diagnosis not present

## 2017-12-25 DIAGNOSIS — E1165 Type 2 diabetes mellitus with hyperglycemia: Secondary | ICD-10-CM | POA: Diagnosis not present

## 2017-12-25 DIAGNOSIS — K219 Gastro-esophageal reflux disease without esophagitis: Secondary | ICD-10-CM | POA: Diagnosis present

## 2017-12-25 DIAGNOSIS — Z6841 Body Mass Index (BMI) 40.0 and over, adult: Secondary | ICD-10-CM | POA: Diagnosis not present

## 2017-12-25 DIAGNOSIS — E1100 Type 2 diabetes mellitus with hyperosmolarity without nonketotic hyperglycemic-hyperosmolar coma (NKHHC): Secondary | ICD-10-CM | POA: Diagnosis present

## 2017-12-25 DIAGNOSIS — E118 Type 2 diabetes mellitus with unspecified complications: Secondary | ICD-10-CM | POA: Diagnosis not present

## 2017-12-25 DIAGNOSIS — I5042 Chronic combined systolic (congestive) and diastolic (congestive) heart failure: Secondary | ICD-10-CM | POA: Diagnosis present

## 2017-12-25 DIAGNOSIS — I5032 Chronic diastolic (congestive) heart failure: Secondary | ICD-10-CM | POA: Diagnosis not present

## 2017-12-25 DIAGNOSIS — Z87891 Personal history of nicotine dependence: Secondary | ICD-10-CM | POA: Diagnosis not present

## 2017-12-25 DIAGNOSIS — E119 Type 2 diabetes mellitus without complications: Secondary | ICD-10-CM | POA: Diagnosis not present

## 2017-12-25 DIAGNOSIS — G4733 Obstructive sleep apnea (adult) (pediatric): Secondary | ICD-10-CM | POA: Diagnosis present

## 2017-12-25 DIAGNOSIS — Z7982 Long term (current) use of aspirin: Secondary | ICD-10-CM | POA: Diagnosis not present

## 2017-12-25 DIAGNOSIS — Z7951 Long term (current) use of inhaled steroids: Secondary | ICD-10-CM | POA: Diagnosis not present

## 2017-12-25 LAB — GLUCOSE, CAPILLARY
Glucose-Capillary: 381 mg/dL — ABNORMAL HIGH (ref 65–99)
Glucose-Capillary: 340 mg/dL — ABNORMAL HIGH (ref 65–99)
Glucose-Capillary: 393 mg/dL — ABNORMAL HIGH (ref 65–99)
Glucose-Capillary: 538 mg/dL (ref 65–99)
Glucose-Capillary: 447 mg/dL — ABNORMAL HIGH (ref 65–99)
Glucose-Capillary: 345 mg/dL — ABNORMAL HIGH (ref 65–99)

## 2017-12-25 LAB — COMPREHENSIVE METABOLIC PANEL
ALT: 29 U/L (ref 14–54)
AST: 26 U/L (ref 15–41)
Albumin: 3 g/dL — ABNORMAL LOW (ref 3.5–5.0)
Alkaline Phosphatase: 143 U/L — ABNORMAL HIGH (ref 38–126)
Anion gap: 12 (ref 5–15)
BUN: 124 mg/dL — ABNORMAL HIGH (ref 6–20)
CO2: 26 mmol/L (ref 22–32)
Calcium: 7.6 mg/dL — ABNORMAL LOW (ref 8.9–10.3)
Chloride: 98 mmol/L — ABNORMAL LOW (ref 101–111)
Creatinine, Ser: 3.5 mg/dL — ABNORMAL HIGH (ref 0.44–1.00)
GFR calc Af Amer: 15 mL/min — ABNORMAL LOW (ref >60)
GFR calc non Af Amer: 13 mL/min — ABNORMAL LOW (ref >60)
Glucose, Bld: 353 mg/dL — ABNORMAL HIGH (ref 65–99)
Potassium: 3.7 mmol/L (ref 3.5–5.1)
Sodium: 136 mmol/L (ref 135–145)
Total Bilirubin: 0.7 mg/dL (ref 0.3–1.2)
Total Protein: 7 g/dL (ref 6.5–8.1)

## 2017-12-25 LAB — MAGNESIUM: Magnesium: 1 mg/dL — ABNORMAL LOW (ref 1.7–2.4)

## 2017-12-25 LAB — RETICULOCYTES
RBC.: 3.3 MIL/uL — ABNORMAL LOW (ref 3.87–5.11)
Retic Count, Absolute: 85.8 10*3/uL (ref 19.0–186.0)
Retic Ct Pct: 2.6 % (ref 0.4–3.1)

## 2017-12-25 LAB — RAPID URINE DRUG SCREEN, HOSP PERFORMED
Amphetamines: NOT DETECTED
Benzodiazepines: NOT DETECTED
Cocaine: NOT DETECTED
Opiates: NOT DETECTED
Tetrahydrocannabinol: NOT DETECTED

## 2017-12-25 LAB — IRON AND TIBC
Iron: 52 ug/dL (ref 28–170)
Saturation Ratios: 12 % (ref 10.4–31.8)
TIBC: 440 ug/dL (ref 250–450)
UIBC: 388 ug/dL

## 2017-12-25 LAB — CBC
HCT: 26.7 % — ABNORMAL LOW (ref 36.0–46.0)
Hemoglobin: 8.4 g/dL — ABNORMAL LOW (ref 12.0–15.0)
MCH: 25.5 pg — ABNORMAL LOW (ref 26.0–34.0)
MCHC: 31.5 g/dL (ref 30.0–36.0)
MCV: 80.9 fL (ref 78.0–100.0)
Platelets: 173 10*3/uL (ref 150–400)
RBC: 3.3 MIL/uL — ABNORMAL LOW (ref 3.87–5.11)
RDW: 17.8 % — ABNORMAL HIGH (ref 11.5–15.5)
WBC: 10.8 10*3/uL — ABNORMAL HIGH (ref 4.0–10.5)

## 2017-12-25 LAB — PHOSPHORUS: Phosphorus: 4.1 mg/dL (ref 2.5–4.6)

## 2017-12-25 LAB — FERRITIN: Ferritin: 98 ng/mL (ref 11–307)

## 2017-12-25 LAB — TSH: TSH: 1.577 u[IU]/mL (ref 0.350–4.500)

## 2017-12-25 LAB — FOLATE: Folate: 15.4 ng/mL (ref >5.9)

## 2017-12-25 LAB — VITAMIN B12: Vitamin B-12: 769 pg/mL (ref 180–914)

## 2017-12-25 MED ORDER — CARVEDILOL 25 MG PO TABS
25.0000 mg | ORAL_TABLET | Freq: Two times a day (BID) | ORAL | Status: DC
  Administered 2017-12-25 – 2017-12-28 (×7): 25 mg via ORAL
  Filled 2017-12-25 (×7): qty 1

## 2017-12-25 MED ORDER — ISOSORBIDE MONONITRATE ER 30 MG PO TB24
30.0000 mg | ORAL_TABLET | Freq: Every day | ORAL | Status: DC
  Administered 2017-12-25 – 2017-12-28 (×4): 30 mg via ORAL
  Filled 2017-12-25 (×4): qty 1

## 2017-12-25 MED ORDER — ATORVASTATIN CALCIUM 40 MG PO TABS
80.0000 mg | ORAL_TABLET | Freq: Every day | ORAL | Status: DC
  Administered 2017-12-25 – 2017-12-27 (×3): 80 mg via ORAL
  Filled 2017-12-25 (×3): qty 2

## 2017-12-25 MED ORDER — INSULIN GLARGINE 100 UNIT/ML ~~LOC~~ SOLN
50.0000 [IU] | Freq: Two times a day (BID) | SUBCUTANEOUS | Status: DC
  Administered 2017-12-25 (×2): 50 [IU] via SUBCUTANEOUS
  Filled 2017-12-25 (×3): qty 0.5

## 2017-12-25 MED ORDER — ACETAMINOPHEN 650 MG RE SUPP: 650.0000 mg | Freq: Four times a day (QID) | RECTAL | Status: DC | PRN

## 2017-12-25 MED ORDER — SODIUM CHLORIDE 0.9 % IV SOLN
INTRAVENOUS | Status: AC
  Administered 2017-12-25 (×2): via INTRAVENOUS

## 2017-12-25 MED ORDER — ASPIRIN EC 81 MG PO TBEC
81.0000 mg | DELAYED_RELEASE_TABLET | Freq: Every day | ORAL | Status: DC
  Administered 2017-12-25 – 2017-12-28 (×4): 81 mg via ORAL
  Filled 2017-12-25 (×4): qty 1

## 2017-12-25 MED ORDER — SODIUM CHLORIDE 0.9 % IV SOLN
INTRAVENOUS | Status: AC
  Administered 2017-12-25: 01:00:00 via INTRAVENOUS

## 2017-12-25 MED ORDER — PANTOPRAZOLE SODIUM 40 MG PO TBEC
40.0000 mg | DELAYED_RELEASE_TABLET | Freq: Every day | ORAL | Status: DC
  Administered 2017-12-25 – 2017-12-28 (×4): 40 mg via ORAL
  Filled 2017-12-25 (×4): qty 1

## 2017-12-25 MED ORDER — SENNOSIDES-DOCUSATE SODIUM 8.6-50 MG PO TABS
1.0000 | ORAL_TABLET | Freq: Two times a day (BID) | ORAL | Status: DC
  Administered 2017-12-25 – 2017-12-26 (×3): 1 via ORAL
  Filled 2017-12-25 (×5): qty 1

## 2017-12-25 MED ORDER — BISACODYL 10 MG RE SUPP: 10.0000 mg | Freq: Every day | RECTAL | Status: DC | PRN

## 2017-12-25 MED ORDER — INSULIN GLARGINE 100 UNIT/ML ~~LOC~~ SOLN
60.0000 [IU] | Freq: Two times a day (BID) | SUBCUTANEOUS | Status: DC
  Administered 2017-12-25 – 2017-12-28 (×6): 60 [IU] via SUBCUTANEOUS
  Filled 2017-12-25 (×7): qty 0.6

## 2017-12-25 MED ORDER — LORATADINE 10 MG PO TABS
10.0000 mg | ORAL_TABLET | Freq: Every day | ORAL | Status: DC
  Administered 2017-12-25 – 2017-12-28 (×4): 10 mg via ORAL
  Filled 2017-12-25 (×4): qty 1

## 2017-12-25 MED ORDER — HYDROCODONE-ACETAMINOPHEN 5-325 MG PO TABS
1.0000 | ORAL_TABLET | ORAL | Status: DC | PRN
  Administered 2017-12-25 (×2): 2 via ORAL
  Filled 2017-12-25 (×2): qty 2

## 2017-12-25 MED ORDER — FERROUS SULFATE 325 (65 FE) MG PO TABS
325.0000 mg | ORAL_TABLET | Freq: Two times a day (BID) | ORAL | Status: DC
  Administered 2017-12-25: 325 mg via ORAL
  Filled 2017-12-25: qty 1

## 2017-12-25 MED ORDER — INSULIN ASPART 100 UNIT/ML ~~LOC~~ SOLN
0.0000 [IU] | Freq: Three times a day (TID) | SUBCUTANEOUS | Status: DC
  Administered 2017-12-25: 20 [IU] via SUBCUTANEOUS
  Administered 2017-12-26: 15 [IU] via SUBCUTANEOUS
  Administered 2017-12-26: 20 [IU] via SUBCUTANEOUS
  Administered 2017-12-26 – 2017-12-27 (×2): 11 [IU] via SUBCUTANEOUS
  Administered 2017-12-27: 4 [IU] via SUBCUTANEOUS
  Administered 2017-12-27: 15 [IU] via SUBCUTANEOUS
  Administered 2017-12-28 (×2): 7 [IU] via SUBCUTANEOUS

## 2017-12-25 MED ORDER — FERROUS SULFATE 325 (65 FE) MG PO TABS
325.0000 mg | ORAL_TABLET | Freq: Every day | ORAL | Status: DC
  Administered 2017-12-26 – 2017-12-28 (×3): 325 mg via ORAL
  Filled 2017-12-25 (×3): qty 1

## 2017-12-25 MED ORDER — ACETAMINOPHEN 325 MG PO TABS: 650.0000 mg | ORAL_TABLET | Freq: Four times a day (QID) | ORAL | Status: DC | PRN

## 2017-12-25 MED ORDER — POLYETHYLENE GLYCOL 3350 17 G PO PACK: 17.0000 g | PACK | Freq: Every day | ORAL | Status: DC | PRN

## 2017-12-25 MED ORDER — INSULIN ASPART 100 UNIT/ML ~~LOC~~ SOLN
10.0000 [IU] | Freq: Three times a day (TID) | SUBCUTANEOUS | Status: DC
  Administered 2017-12-25 – 2017-12-28 (×9): 10 [IU] via SUBCUTANEOUS

## 2017-12-25 MED ORDER — MOMETASONE FURO-FORMOTEROL FUM 200-5 MCG/ACT IN AERO
2.0000 | INHALATION_SPRAY | Freq: Two times a day (BID) | RESPIRATORY_TRACT | Status: DC
  Administered 2017-12-25 – 2017-12-28 (×6): 2 via RESPIRATORY_TRACT
  Filled 2017-12-25: qty 8.8

## 2017-12-25 MED ORDER — MAGNESIUM SULFATE 2 GM/50ML IV SOLN
2.0000 g | Freq: Once | INTRAVENOUS | Status: AC
  Administered 2017-12-25: 2 g via INTRAVENOUS
  Filled 2017-12-25: qty 50

## 2017-12-25 MED ORDER — TICAGRELOR 90 MG PO TABS
90.0000 mg | ORAL_TABLET | Freq: Two times a day (BID) | ORAL | Status: DC
  Administered 2017-12-25 – 2017-12-28 (×8): 90 mg via ORAL
  Filled 2017-12-25 (×8): qty 1

## 2017-12-25 MED ORDER — ONDANSETRON HCL 4 MG/2ML IJ SOLN: 4.0000 mg | Freq: Four times a day (QID) | INTRAMUSCULAR | Status: DC | PRN

## 2017-12-25 MED ORDER — SODIUM CHLORIDE 0.9 % IV BOLUS
500.0000 mL | Freq: Once | INTRAVENOUS | Status: AC
  Administered 2017-12-25: 500 mL via INTRAVENOUS

## 2017-12-25 MED ORDER — MONTELUKAST SODIUM 10 MG PO TABS
10.0000 mg | ORAL_TABLET | Freq: Every day | ORAL | Status: DC
  Administered 2017-12-25 – 2017-12-27 (×4): 10 mg via ORAL
  Filled 2017-12-25 (×4): qty 1

## 2017-12-25 MED ORDER — PREGABALIN 50 MG PO CAPS
300.0000 mg | ORAL_CAPSULE | Freq: Every day | ORAL | Status: DC
  Administered 2017-12-25 – 2017-12-27 (×4): 300 mg via ORAL
  Filled 2017-12-25 (×4): qty 6

## 2017-12-25 MED ORDER — VALACYCLOVIR HCL 500 MG PO TABS
500.0000 mg | ORAL_TABLET | Freq: Every day | ORAL | Status: DC
  Administered 2017-12-25 – 2017-12-26 (×2): 500 mg via ORAL
  Filled 2017-12-25 (×2): qty 1

## 2017-12-25 MED ORDER — INSULIN ASPART 100 UNIT/ML ~~LOC~~ SOLN: 0.0000 [IU] | Freq: Every day | SUBCUTANEOUS | Status: DC

## 2017-12-25 MED ORDER — HYDRALAZINE HCL 50 MG PO TABS
100.0000 mg | ORAL_TABLET | Freq: Three times a day (TID) | ORAL | Status: DC
  Administered 2017-12-25 – 2017-12-28 (×10): 100 mg via ORAL
  Filled 2017-12-25 (×10): qty 2

## 2017-12-25 MED ORDER — ALLOPURINOL 300 MG PO TABS
300.0000 mg | ORAL_TABLET | Freq: Every day | ORAL | Status: DC
  Administered 2017-12-25: 300 mg via ORAL
  Filled 2017-12-25: qty 1

## 2017-12-25 MED ORDER — ENOXAPARIN SODIUM 30 MG/0.3ML ~~LOC~~ SOLN
30.0000 mg | Freq: Every day | SUBCUTANEOUS | Status: DC
  Administered 2017-12-25 – 2017-12-28 (×4): 30 mg via SUBCUTANEOUS
  Filled 2017-12-25 (×4): qty 0.3

## 2017-12-25 MED ORDER — ALBUTEROL SULFATE (2.5 MG/3ML) 0.083% IN NEBU: 2.5000 mg | INHALATION_SOLUTION | Freq: Four times a day (QID) | RESPIRATORY_TRACT | Status: DC | PRN

## 2017-12-25 MED ORDER — INSULIN ASPART 100 UNIT/ML ~~LOC~~ SOLN
20.0000 [IU] | Freq: Once | SUBCUTANEOUS | Status: AC
  Administered 2017-12-25: 20 [IU] via SUBCUTANEOUS

## 2017-12-25 MED ORDER — INSULIN ASPART 100 UNIT/ML ~~LOC~~ SOLN
0.0000 [IU] | Freq: Three times a day (TID) | SUBCUTANEOUS | Status: DC
  Administered 2017-12-25: 9 [IU] via SUBCUTANEOUS
  Administered 2017-12-25: 7 [IU] via SUBCUTANEOUS

## 2017-12-25 MED ORDER — COLCHICINE 0.6 MG PO TABS
0.3000 mg | ORAL_TABLET | Freq: Every day | ORAL | Status: DC
  Administered 2017-12-25 – 2017-12-26 (×2): 0.3 mg via ORAL
  Filled 2017-12-25 (×3): qty 0.5

## 2017-12-25 MED ORDER — INSULIN ASPART 100 UNIT/ML ~~LOC~~ SOLN
0.0000 [IU] | Freq: Every day | SUBCUTANEOUS | Status: DC
  Administered 2017-12-25: 5 [IU] via SUBCUTANEOUS
  Administered 2017-12-25 – 2017-12-26 (×2): 4 [IU] via SUBCUTANEOUS
  Administered 2017-12-27: 3 [IU] via SUBCUTANEOUS

## 2017-12-25 MED ORDER — ONDANSETRON HCL 4 MG PO TABS: 4.0000 mg | ORAL_TABLET | Freq: Four times a day (QID) | ORAL | Status: DC | PRN

## 2017-12-25 NOTE — Plan of Care (Signed)
  Problem: Nutrition: Goal: Adequate nutrition will be maintained Outcome: Progressing   Problem: Coping: Goal: Level of anxiety will decrease Outcome: Progressing   Problem: Elimination: Goal: Will not experience complications related to bowel motility Outcome: Progressing Goal: Will not experience complications related to urinary retention Outcome: Progressing   Problem: Pain Managment: Goal: General experience of comfort will improve Outcome: Progressing   Problem: Safety: Goal: Ability to remain free from injury will improve Outcome: Progressing   Problem: Skin Integrity: Goal: Risk for impaired skin integrity will decrease Outcome: Progressing   

## 2017-12-25 NOTE — Evaluation (Signed)
Physical Therapy Evaluation Patient Details Name: [name] MRN: [id number] DOB: 02-Mar-1956 Today's Date: 12/25/2017   History of Present Illness  62 y.o. female with a known history of aortic stenosis, asthma, bronchitis, CHF, diabetes and chronic anemia with c/o fatigue/weakness and dx with AKI  Clinical Impression  Pt admitted as above and presenting with functional mobility limitations 2* generalized weakness, obesity and ambulatory balance deficits.  Pt should progress to dc home with family assist and would benefit from follow up HHPT to further address deficits.    Follow Up Recommendations Home health PT    Equipment Recommendations  None recommended by PT    Recommendations for Other Services       Precautions / Restrictions Precautions Precautions: Fall Restrictions Weight Bearing Restrictions: No      Mobility  Bed Mobility Overal bed mobility: Modified Independent             General bed mobility comments: Pt to/from EOB unassisted  Transfers Overall transfer level: Needs assistance Equipment used: Rolling walker (2 wheeled) Transfers: Sit to/from Stand Sit to Stand: Min guard         General transfer comment: steady assist on rising  Ambulation/Gait Ambulation/Gait assistance: Min assist Gait Distance (Feet): 200 Feet Assistive device: Rolling walker (2 wheeled) Gait Pattern/deviations: Step-through pattern;Decreased step length - right;Decreased step length - left;Shuffle;Wide base of support;Staggering left;Staggering right Gait velocity: decr   General Gait Details: Pt unstable all directions - paritally corrected with use of RW - and with one episode mild buckling.   Increased time with multiple standing rest breaks to complete task  Stairs            Wheelchair Mobility    Modified Rankin (Stroke Patients Only)       Balance Overall balance assessment: Needs assistance Sitting-balance support: No upper extremity  supported;Feet supported Sitting balance-Leahy Scale: Good     Standing balance support: No upper extremity supported Standing balance-Leahy Scale: Fair                               Pertinent Vitals/Pain Pain Assessment: Faces Faces Pain Scale: Hurts even more(Sore to touch ) Pain Location: R great toe Pain Descriptors / Indicators: Sore;Grimacing Pain Intervention(s): Limited activity within patient's tolerance;Monitored during session    Home Living Family/patient expects to be discharged to:: Private residence Living Arrangements: Children Available Help at Discharge: Family;Available PRN/intermittently Type of Home: House Home Access: Stairs to enter Entrance Stairs-Rails: Right;Left;Can reach both Entrance Stairs-Number of Steps: 4 Home Layout: One level Home Equipment: Walker - 2 wheels;Cane - single point Additional Comments: has been independent at home, unable to be out of house recently but did not use AD    Prior Function Level of Independence: Independent with assistive device(s)               Hand Dominance   Dominant Hand: Right    Extremity/Trunk Assessment   Upper Extremity Assessment Upper Extremity Assessment: Generalized weakness    Lower Extremity Assessment Lower Extremity Assessment: Generalized weakness       Communication   Communication: No difficulties  Cognition Arousal/Alertness: Awake/alert Behavior During Therapy: Flat affect Overall Cognitive Status: Within Functional Limits for tasks assessed  General Comments      Exercises     Assessment/Plan    PT Assessment Patient needs continued PT services  PT Problem List Decreased strength;Decreased range of motion;Decreased activity tolerance;Decreased mobility;Pain;Decreased balance;Decreased knowledge of use of DME       PT Treatment Interventions DME instruction;Gait training;Stair training;Functional  mobility training;Therapeutic activities;Therapeutic exercise;Patient/family education    PT Goals (Current goals can be found in the Care Plan section)  Acute Rehab PT Goals Patient Stated Goal: Regain IND PT Goal Formulation: With patient Time For Goal Achievement: 01/08/18 Potential to Achieve Goals: Good    Frequency Min 3X/week   Barriers to discharge        Co-evaluation               AM-PAC PT "6 Clicks" Daily Activity  Outcome Measure Difficulty turning over in bed (including adjusting bedclothes, sheets and blankets)?: A Little Difficulty moving from lying on back to sitting on the side of the bed? : A Lot Difficulty sitting down on and standing up from a chair with arms (e.g., wheelchair, bedside commode, etc,.)?: A Lot Help needed moving to and from a bed to chair (including a wheelchair)?: A Little Help needed walking in hospital room?: A Little Help needed climbing 3-5 steps with a railing? : A Little 6 Click Score: 16    End of Session   Activity Tolerance: Patient limited by fatigue;Patient tolerated treatment well Patient left: in bed;with call bell/phone within reach;with bed alarm set Nurse Communication: Mobility status PT Visit Diagnosis: Unsteadiness on feet (R26.81);Difficulty in walking, not elsewhere classified (R26.2)    Time: 1610-9604 PT Time Calculation (min) (ACUTE ONLY): 31 min   Charges:   PT Evaluation $PT Eval Low Complexity: 1 Low PT Treatments $Gait Training: 8-22 mins   PT G Codes:        Pg [phone]   Leilene Diprima 12/25/2017, 3:41 PM

## 2017-12-25 NOTE — Progress Notes (Signed)
Inpatient Diabetes Program Recommendations  AACE/ADA: New Consensus Statement on Inpatient Glycemic Control (2015)  Target Ranges:  Prepandial:   less than 140 mg/dL      Peak postprandial:   less than 180 mg/dL (1-2 hours)      Critically ill patients:  140 - 180 mg/dL   Lab Results  Component Value Date   GLUCAP 340 (H) 12/25/2017   HGBA1C 11.5 (A) 12/24/2017    Review of Glycemic Control  Diabetes history: Type 2  Outpatient Diabetes medications: Lantus 48 units BID, Novolog 10 units TID with meals, Trulicity 8.61 mg/wk. Current orders for Inpatient glycemic control: Lantus 50 units BID, Novolog SENSITIVE TID & HS  Inpatient Diabetes Program Recommendations:    Noted that last HgbA1C is 11.5%. Noted that BUN=124 and Creatinine = 3.5.  The hgbA1C may not be accurate due to the elevated BUN and Cr.    May need to adjust correction scale or add Novolog meal coverage if blood sugars continue to be elevated. Will continue to monitor blood sugars while in the hospital.  Harvel Ricks RN BSN CDE Diabetes Coordinator Pager: [phone]  8am-5pm

## 2017-12-25 NOTE — Progress Notes (Signed)
Patient confirmed with RN should she need blood she would be willing to receive a blood transfusion although patient is currently a practicing Jehovah's Witness.

## 2017-12-25 NOTE — ED Notes (Signed)
Provider slow down fluids

## 2017-12-25 NOTE — ED Notes (Signed)
ED TO INPATIENT HANDOFF REPORT  Name/Age/Gender [name] 62 y.o. female  Code Status Code Status History    Date Active Date Inactive Code Status Order ID Comments User Context   10/03/2017 1803 10/11/2017 1625 Full Code [id number]  Epifanio Lesches, MD ED   09/14/2017 1310 09/20/2017 1434 Full Code [id number]  Max Sane, MD ED   08/15/2017 2306 08/19/2017 2039 Full Code [id number]  Robert Bellow, MD Inpatient   08/11/2017 1350 08/15/2017 2306 Full Code [id number]  Demetrios Loll, MD Inpatient   06/02/2017 0234 06/06/2017 1536 Full Code [id number]  Caren Griffins, MD Inpatient   04/28/2017 0516 05/02/2017 1624 Full Code [id number]  Rise Patience, MD ED   03/17/2017 1222 03/19/2017 2030 Full Code [id number]  Liliane Shi, PA-C ED   12/13/2016 0345 12/14/2016 1647 Full Code [id number]  Reubin Milan, MD Inpatient   09/13/2016 1149 09/19/2016 1458 Full Code [id number]  Flossie Dibble, MD ED   09/04/2016 0137 09/05/2016 1801 Full Code [id number]  Etta Quill, DO ED   08/21/2016 1846 08/22/2016 1733 Full Code [id number]  Thurnell Lose, MD Inpatient   03/18/2015 0939 03/19/2015 1802 Full Code [id number]  Dellia Nims, MD Inpatient   02/15/2015 1700 02/18/2015 1817 Full Code [id number]  Francesca Oman, DO Inpatient   10/02/2014 1524 10/04/2014 1649 Full Code [id number]  Samella Parr, NP Inpatient   03/25/2012 0531 04/03/2012 1931 Full Code [id number]  Maudry Diego, RN Inpatient      Home/SNF/Other Home  Chief Complaint acute kidney injury  Level of Care/Admitting Diagnosis ED Disposition    ED Disposition Condition [facility] Area: [facility] [[id number]]  Level of Care: Telemetry [5]  Admit to tele based on following criteria: Other see comments  Comments: aki  Diagnosis: AKI (acute kidney injury) [facility]) [[id number]]  Admitting Physician: Toy Baker [[id number]]  Attending Physician: Toy Baker [[id number]]  Estimated length of stay: 3 - 4 days  Certification:: I certify this patient will need inpatient services for at least 2 midnights  PT Class (Do Not Modify): Inpatient [101]  PT Acc Code (Do Not Modify): Private [1]       Medical History Past Medical History:  Diagnosis Date  . Anemia   . Aortic stenosis    Echo 8/18: mean 13, peak 28, LVOT/AV mean velocity 0.51  . Arthritis   . Asthma    As a child   . Bronchitis   . CAD (coronary artery disease)    a. 09/2016: 50% Ost 1st Mrg stenosis, 50% 2nd Mrg stenosis, 20% Mid-Cx, 95% Prox LAD, 40% mid-LAD, and 10% dist-LAD stenosis. Staged PCI with DES to Prox-LAD.   [name] Chronic combined systolic and diastolic CHF (congestive heart failure) ([place]) 2011   echo 2/18: EF 55-60, normal wall motion, grade 2 diastolic dysfunction, trivial AI // echo 3/18: Septal and apical HK, EF 45-50, normal wall motion, trivial AI, mild LAE, PASP 38 // echo 8/18: EF 60-65, normal wall motion, grade 1 diastolic dysfunction, calcified aortic valve leaflets, mild aortic stenosis (mean 13, peak 28, LVOT/AV mean velocity 0.51), mild AI, moderate MAC, mild LAE, trivial TR   . Chronic kidney disease    STAGE 4  . Depression   . Diabetes mellitus Dx 1989  . GERD (gastroesophageal reflux disease)   . Gout   . Hepatitis C Dx 2013  . Hypertension Dx 1989  . Obesity   . Obstructive sleep apnea   .  Pancreatitis 2013  . Refusal of blood transfusions as patient is Jehovah's Witness   . Tendinitis   . Tremors of nervous system    LEFT HAND  . Ulcer 2010    Allergies Allergies  Allergen Reactions  . Shellfish Allergy Anaphylaxis and Swelling  . Diazepam Other (See Comments)    "felt like out of body experience"  . Morphine And Related Itching    IV Location/Drains/Wounds Patient Lines/Drains/Airways Status   Active Line/Drains/Airways    Name:   Placement date:   Placement time:   Site:   Days:   Peripheral IV 12/24/17 Left Forearm   12/24/17    2008    Forearm   1   External Urinary Catheter    10/03/17    -    -   83   Incision (Closed) 08/12/17 Thigh Left   08/12/17    1320     135   Incision (Closed) 08/14/17 Leg Left   08/14/17    0947     133   Wound / Incision (Open or Dehisced) 09/14/17 Non-pressure wound Thigh Left;Medial;Upper per patient this was a "boil", a wound vac is in place   09/14/17    1600    Thigh   102          Labs/Imaging Results for orders placed or performed during the hospital encounter of 12/24/17 (from the past 48 hour(s))  CBG monitoring, ED     Status: Abnormal   Collection Time: 12/24/17  5:35 PM  Result Value Ref Range   Glucose-Capillary 365 (H) 65 - 99 mg/dL  Comprehensive metabolic panel     Status: Abnormal   Collection Time: 12/24/17  6:09 PM  Result Value Ref Range   Sodium 134 (L) 135 - 145 mmol/L   Potassium 3.7 3.5 - 5.1 mmol/L   Chloride 95 (L) 101 - 111 mmol/L   CO2 26 22 - 32 mmol/L   Glucose, Bld 379 (H) 65 - 99 mg/dL   BUN 121 (H) 6 - 20 mg/dL    Comment: RESULTS CONFIRMED BY MANUAL DILUTION   Creatinine, Ser 3.62 (H) 0.44 - 1.00 mg/dL   Calcium 8.1 (L) 8.9 - 10.3 mg/dL   Total Protein 8.0 6.5 - 8.1 g/dL   Albumin 3.4 (L) 3.5 - 5.0 g/dL   AST 29 15 - 41 U/L   ALT 34 14 - 54 U/L   Alkaline Phosphatase 161 (H) 38 - 126 U/L   Total Bilirubin 0.3 0.3 - 1.2 mg/dL   GFR calc non Af Amer 13 (L) >60 mL/min   GFR calc Af Amer 15 (L) >60 mL/min    Comment: (NOTE) The eGFR has been calculated using the CKD EPI equation. This calculation has not been validated in all clinical situations. eGFR's persistently <60 mL/min signify possible Chronic Kidney Disease.    Anion gap 13 5 - 15    Comment: Performed at [facility], [place] [address], [place], [place] [address]  CBC with Differential     Status: Abnormal   Collection Time: 12/24/17  6:09 PM  Result Value Ref Range   WBC 10.9 (H) 4.0 - 10.5 K/uL   RBC 3.57 (L) 3.87 - 5.11 MIL/uL   Hemoglobin 9.2 (L) 12.0 - 15.0 g/dL   HCT 29.6 (L) 36.0 - 46.0 %   MCV 82.9  78.0 - 100.0 fL   MCH 25.8 (L) 26.0 - 34.0 pg   MCHC 31.1 30.0 - 36.0 g/dL  RDW 17.8 (H) 11.5 - 15.5 %   Platelets 218 150 - 400 K/uL   Neutrophils Relative % 71 %   Lymphocytes Relative 20 %   Monocytes Relative 5 %   Eosinophils Relative 4 %   Basophils Relative 0 %   Neutro Abs 7.8 (H) 1.7 - 7.7 K/uL   Lymphs Abs 2.2 0.7 - 4.0 K/uL   Monocytes Absolute 0.5 0.1 - 1.0 K/uL   Eosinophils Absolute 0.4 0.0 - 0.7 K/uL   Basophils Absolute 0.0 0.0 - 0.1 K/uL   Smear Review MORPHOLOGY UNREMARKABLE     Comment: Performed at [facility], [place] [address] [place], [place] [address]  Blood gas, venous     Status: Abnormal   Collection Time: 12/24/17  6:22 PM  Result Value Ref Range   pH, Ven 7.436 (H) 7.250 - 7.430   pCO2, Ven 38.5 (L) 44.0 - 60.0 mmHg   pO2, Ven 41.1 32.0 - 45.0 mmHg   Bicarbonate 25.5 20.0 - 28.0 mmol/L   Acid-Base Excess 1.7 0.0 - 2.0 mmol/L   O2 Saturation 75.7 %   Patient temperature 98.6    Collection site VENOUS    Drawn by DRAWN BY RN    Sample type VENOUS     Comment: Performed at [facility], [place] [address] [place], [place] [address]  I-Stat CG4 Lactic Acid, ED     Status: None   Collection Time: 12/24/17  6:33 PM  Result Value Ref Range   Lactic Acid, Venous 0.99 0.5 - 1.9 mmol/L  Urinalysis, Routine w reflex microscopic     Status: Abnormal   Collection Time: 12/24/17 10:10 PM  Result Value Ref Range   Color, Urine YELLOW YELLOW   APPearance CLEAR CLEAR   Specific Gravity, Urine 1.009 1.005 - 1.030   pH 6.0 5.0 - 8.0   Glucose, UA NEGATIVE NEGATIVE mg/dL   Hgb urine dipstick NEGATIVE NEGATIVE   Bilirubin Urine NEGATIVE NEGATIVE   Ketones, ur NEGATIVE NEGATIVE mg/dL   Protein, ur 30 (A) NEGATIVE mg/dL   Nitrite NEGATIVE NEGATIVE   Leukocytes, UA NEGATIVE NEGATIVE   RBC / HPF 0-5 0 - 5 RBC/hpf   WBC, UA 0-5 0 - 5 WBC/hpf   Bacteria, UA RARE (A) NONE SEEN   Squamous Epithelial / LPF 0-5 0 - 5   Hyaline  Casts, UA PRESENT     Comment: Performed at [facility], [place] [address], [place], [place] [address]  CBG monitoring, ED     Status: Abnormal   Collection Time: 12/24/17 10:16 PM  Result Value Ref Range   Glucose-Capillary 337 (H) 65 - 99 mg/dL   No results found.  Pending Labs [employer] (From admission, onward)   Start     Ordered   Signed and Held  Hemoglobin A1c  Tomorrow morning,   R    Comments:  To assess prior glycemic control    Signed and Held   Signed and Held  Magnesium  Tomorrow morning,   R    Comments:  Call MD if <1.5    Signed and Held   Signed and Held  Phosphorus  Tomorrow morning,   R     Signed and Held   Signed and Held  TSH  Once,   R    Comments:  Cancel if already done within 1 month and notify MD    Signed and Held   Signed and Held  Comprehensive metabolic panel  Once,   R    Comments:  Cal MD for K<3.5 or >5.0    Signed and Held   Signed and Held  CBC  Once,   R    Comments:  Call for hg <8.0    Signed and Held      Vitals/Pain Today's Vitals   12/24/17 1729 12/24/17 1755 12/24/17 2211 12/24/17 2258  BP:  (!) 168/78 (!) 162/78 (!) 161/78  Pulse:  90 97 94  Resp:  _0 Temp:  98.8 F (37.1 C)    TempSrc:  Oral    SpO2:  100% 98% 99%  PainSc: 9        Isolation Precautions No active isolations  Medications Medications  sodium chloride 0.9 % bolus 1,000 mL (0 mLs Intravenous Stopped 12/25/17 0029)    Mobility walks

## 2017-12-25 NOTE — Progress Notes (Signed)
Paged doctor due to patients high blood pressure. Awaiting response/orders.

## 2017-12-25 NOTE — Progress Notes (Signed)
PROGRESS NOTE  [name] ION:[id number] DOB: Oct 31, 1955 DOA: 12/24/2017 PCP: [name], PA-C  HPI/Recap of past 24 hours:  Generalized weakness C/o right big toe pain,  She reports she is started on torsemide and zaroxolyn a month ago for leg edema, prior to that she was on lasix She reports her edema has resolved for the last few weeks  She reports she is scheduled to have EEG for tremors that was started recently  She reports she is scheduled to have egd and colonoscopy in the near further, she is not sure what is the reason for that   She reports she used to see a nephrology in the past   Assessment/Plan: Active Problems:   Diabetes mellitus type 2, uncontrolled, with complications (HCC)   Anemia in stage 3 chronic kidney disease ([name])   Essential hypertension   Obesity   CKD (chronic kidney disease) stage 4, GFR 15-29 ml/min (HCC)   Diabetic neuropathy ([name])   Asthma   Diabetic hyperosmolar non-ketotic state ([name])   CAD (coronary artery disease)   Chronic diastolic heart failure (HCC)   AKI (acute kidney injury) ([name])   Hyperglycemia   Gout   AKI on CKDIV -with uremia bun 124, she appear drowsy, per chart review, she was noticed to be drowsy when she is seen at the gi clinic on 6/4 -she also has tremors recently for which she is referred to neurology for EEG -she reports recently started on new diuretics which has helped her leg edema, her blood glucose is elevated as well -ua no infection, no blood, no wbc -renal US no hydro, + medical renal disease -hold diuretics and continue gentle hydration, repeat lab in am, if no improvement will need to consult nephrology.  hypomagnesemia Presumably from diuretics She reports quit drinking alcohol for a long time Replace  Diastolic chf Currently dry Continue coreg, hold diuretics, on gentle hydration,  Strict intake and output, daily weight  CAD s/p stent on asa,  ticagrelor, statin and  betablocker Denies chest pain  Insulin dependent dm2, uncontrolled a1c 11.5 Blood glucose 379 on presentation Titrate insulin Diabetes education  Anemia of chronic disease -Iron/b12/folate unremarkable -retic inappropriately low --FOBT pending collection -hgb appear at baseline between 8-9  HTN:  stable on coreg, hydralazine, imdur,  Hold diuretics  Gout ; continue home meds allopurinol  Chronic hepc  Per chart review, she is closely followed by GI, she is scheduled to have egd/colonoscopy, then plan for hep c treatment in the near future.  Outpatient F/u with gi clinc  H/o alcohol use, reports quit a long time ago  Morbid obesity: Body mass index is 45.55 kg/m.   FTT: get PT  Code Status: full  Family Communication: patient  Disposition Plan: not ready for discharge   Consultants:  Diabetes RN  PT  Procedures:  none  Antibiotics:  none   Objective: BP (!) 163/71 (BP Location: Right Arm)   Pulse 95   Temp 98.2 F (36.8 C) (Oral)   Resp 18   Ht 5\' 8"  (1.727 m)   Wt 135.9 kg (299 lb 9.7 oz)   SpO2 97%   BMI 45.55 kg/m   Intake/Output Summary (Last 24 hours) at 12/25/2017 1052 Last data filed at 12/25/2017 0815 Gross per 24 hour  Intake 815.83 ml  Output 600 ml  Net 215.83 ml   Filed Weights   12/25/17 0103  Weight: 135.9 kg (299 lb 9.7 oz)    Exam: Patient is examined daily  including today on 12/25/2017, exams remain the same as of yesterday except that has changed    General:  Weak, obese, drowsy, but oriented x3, she walked with PT  Cardiovascular: RRR  Respiratory: diminished at basis, no wheezing, no rales, no rhonchi  Abdomen: Soft/ND/NT, positive BS  Musculoskeletal: No Edema  Neuro: drowsy, oriented x3  Data Reviewed: Basic Metabolic Panel: Recent Labs  Lab 12/24/17 1809 12/25/17 0511  NA 134* 136  K 3.7 3.7  CL 95* 98*  CO2 26 26  GLUCOSE 379* 353*  BUN 121* 124*  CREATININE 3.62* 3.50*  CALCIUM 8.1* 7.6*   MG  --  1.0*  PHOS  --  4.1   Liver Function Tests: Recent Labs  Lab 12/24/17 1809 12/25/17 0511  AST 29 26  ALT 34 29  ALKPHOS 161* 143*  BILITOT 0.3 0.7  PROT 8.0 7.0  ALBUMIN 3.4* 3.0*   No results for input(s): LIPASE, AMYLASE in the last 168 hours. No results for input(s): AMMONIA in the last 168 hours. CBC: Recent Labs  Lab 12/24/17 1809 12/25/17 0511  WBC 10.9* 10.8*  NEUTROABS 7.8*  --   HGB 9.2* 8.4*  HCT 29.6* 26.7*  MCV 82.9 80.9  PLT 218 173   Cardiac Enzymes:   No results for input(s): CKTOTAL, CKMB, CKMBINDEX, TROPONINI in the last 168 hours. BNP (last 3 results) Recent Labs    07/15/17 1213 09/14/17 0710 10/03/17 1628  BNP 110.1* 195.0* 147.0*    ProBNP (last 3 results) Recent Labs    08/05/17 1557  PROBNP 401*    CBG: Recent Labs  Lab 12/24/17 1735 12/24/17 2216 12/25/17 0113 12/25/17 0750  GLUCAP 365* 337* 381* 340*    No results found for this or any previous visit (from the past 240 hour(s)).   Studies: Dg Knee 1-2 Views Right  Result Date: 12/25/2017 CLINICAL DATA:  Right knee pain EXAM: RIGHT KNEE - 1-2 VIEW COMPARISON:  None. FINDINGS: No fracture or malalignment. Moderate arthritis of the medial compartment. Mild patellofemoral degenerative changes. No significant knee effusion IMPRESSION: Mild to moderate arthritis of the knee. No acute osseous abnormality. Electronically Signed   By: Donavan Foil M.D.   On: 12/25/2017 01:58   US Renal  Result Date: 12/25/2017 CLINICAL DATA:  Acute renal failure EXAM: RENAL / URINARY TRACT ULTRASOUND COMPLETE COMPARISON:  09/14/2017 FINDINGS: Right Kidney: Length: 12.0 cm. Diffusely increased echotexture. No mass or hydronephrosis. Left Kidney: Length: 11.7 cm. Diffusely increased echotexture. No hydronephrosis. 17 mm cyst in the midpole. Bladder: Appears normal for degree of bladder distention. IMPRESSION: Increased echotexture bilaterally compatible with chronic medical renal disease. No  hydronephrosis. Electronically Signed   By: Rolm Baptise M.D.   On: 12/25/2017 09:53   Dg Foot 2 Views Right  Result Date: 12/25/2017 CLINICAL DATA:  RIGHT big toe pain for 2 days. No injury. History of diabetes. EXAM: RIGHT FOOT - 2 VIEW COMPARISON:  RIGHT foot radiograph April 11, 2017 FINDINGS: No acute fracture deformity or dislocation. Chronic deformity fifth PIP, probable old injury. No destructive bony lesions. Soft tissue planes are not suspicious. IMPRESSION: Stable examination, no acute osseous process. Electronically Signed   By: Elon Alas M.D.   On: 12/25/2017 00:47    Scheduled Meds: . allopurinol  300 mg Oral Daily  . aspirin EC  81 mg Oral Daily  . atorvastatin  80 mg Oral QPC supper  . carvedilol  25 mg Oral BID WC  . colchicine  0.3 mg Oral Daily  .  enoxaparin (LOVENOX) injection  30 mg Subcutaneous Daily  . ferrous sulfate  325 mg Oral BID WC  . hydrALAZINE  100 mg Oral TID  . insulin aspart  0-5 Units Subcutaneous QHS  . insulin aspart  0-9 Units Subcutaneous TID WC  . insulin glargine  50 Units Subcutaneous BID  . isosorbide mononitrate  30 mg Oral Daily  . loratadine  10 mg Oral Daily  . mometasone-formoterol  2 puff Inhalation BID  . montelukast  10 mg Oral QHS  . pantoprazole  40 mg Oral Daily  . pregabalin  300 mg Oral QHS  . ticagrelor  90 mg Oral BID  . valACYclovir  500 mg Oral Daily    Continuous Infusions: . sodium chloride 50 mL/hr at 12/25/17 0123     Time spent: 35 mins I have personally reviewed and interpreted on  12/25/2017 daily labs, tele strips, imagings as discussed above under date review session and assessment and plans.  I reviewed all nursing notes, vitals, pertinent old records  I have discussed plan of care as described above with RN , patient  on 12/25/2017   Florencia Reasons MD, PhD  [facility] Pager [phone]. If 7PM-7AM, please contact night-coverage at [email or web address], password [facility] 12/25/2017, 10:52 AM  LOS: 0 days

## 2017-12-25 NOTE — Plan of Care (Signed)
  Problem: Activity: Goal: Risk for activity intolerance will decrease Outcome: Progressing   Problem: Nutrition: Goal: Adequate nutrition will be maintained Outcome: Progressing   Problem: Coping: Goal: Level of anxiety will decrease Outcome: Progressing   Problem: Elimination: Goal: Will not experience complications related to bowel motility Outcome: Progressing Goal: Will not experience complications related to urinary retention Outcome: Progressing   Problem: Pain Managment: Goal: General experience of comfort will improve Outcome: Progressing   Problem: Safety: Goal: Ability to remain free from injury will improve Outcome: Progressing   Problem: Skin Integrity: Goal: Risk for impaired skin integrity will decrease Outcome: Progressing   

## 2017-12-26 LAB — CBC WITH DIFFERENTIAL/PLATELET
Basophils Absolute: 0 10*3/uL (ref 0.0–0.1)
Basophils Relative: 0 %
Eosinophils Absolute: 0.4 10*3/uL (ref 0.0–0.7)
Eosinophils Relative: 5 %
HCT: 27 % — ABNORMAL LOW (ref 36.0–46.0)
Hemoglobin: 8.4 g/dL — ABNORMAL LOW (ref 12.0–15.0)
Lymphocytes Relative: 21 %
Lymphs Abs: 1.7 10*3/uL (ref 0.7–4.0)
MCH: 25.8 pg — ABNORMAL LOW (ref 26.0–34.0)
MCHC: 31.1 g/dL (ref 30.0–36.0)
MCV: 83.1 fL (ref 78.0–100.0)
Monocytes Absolute: 0.5 10*3/uL (ref 0.1–1.0)
Monocytes Relative: 6 %
Neutro Abs: 5.4 10*3/uL (ref 1.7–7.7)
Neutrophils Relative %: 68 %
Platelets: 174 10*3/uL (ref 150–400)
RBC: 3.25 MIL/uL — ABNORMAL LOW (ref 3.87–5.11)
RDW: 18 % — ABNORMAL HIGH (ref 11.5–15.5)
WBC: 8 10*3/uL (ref 4.0–10.5)

## 2017-12-26 LAB — GLUCOSE, CAPILLARY
Glucose-Capillary: 271 mg/dL — ABNORMAL HIGH (ref 65–99)
Glucose-Capillary: 323 mg/dL — ABNORMAL HIGH (ref 65–99)
Glucose-Capillary: 360 mg/dL — ABNORMAL HIGH (ref 65–99)
Glucose-Capillary: 325 mg/dL — ABNORMAL HIGH (ref 65–99)

## 2017-12-26 LAB — BASIC METABOLIC PANEL
Anion gap: 12 (ref 5–15)
BUN: 109 mg/dL — ABNORMAL HIGH (ref 6–20)
CO2: 24 mmol/L (ref 22–32)
Calcium: 7.6 mg/dL — ABNORMAL LOW (ref 8.9–10.3)
Chloride: 102 mmol/L (ref 101–111)
Creatinine, Ser: 3.39 mg/dL — ABNORMAL HIGH (ref 0.44–1.00)
GFR calc Af Amer: 16 mL/min — ABNORMAL LOW (ref >60)
GFR calc non Af Amer: 14 mL/min — ABNORMAL LOW (ref >60)
Glucose, Bld: 249 mg/dL — ABNORMAL HIGH (ref 65–99)
Potassium: 3.7 mmol/L (ref 3.5–5.1)
Sodium: 138 mmol/L (ref 135–145)

## 2017-12-26 LAB — AMMONIA: Ammonia: 21 umol/L (ref 9–35)

## 2017-12-26 LAB — MAGNESIUM: Magnesium: 1.4 mg/dL — ABNORMAL LOW (ref 1.7–2.4)

## 2017-12-26 MED ORDER — HYDROXYZINE HCL 10 MG PO TABS
10.0000 mg | ORAL_TABLET | Freq: Three times a day (TID) | ORAL | Status: DC | PRN
  Administered 2017-12-26: 10 mg via ORAL
  Filled 2017-12-26: qty 1

## 2017-12-26 MED ORDER — ALLOPURINOL 100 MG PO TABS
200.0000 mg | ORAL_TABLET | Freq: Every day | ORAL | Status: DC
  Administered 2017-12-26 – 2017-12-28 (×3): 200 mg via ORAL
  Filled 2017-12-26 (×3): qty 2

## 2017-12-26 MED ORDER — POLYETHYLENE GLYCOL 3350 17 G PO PACK
17.0000 g | PACK | Freq: Every day | ORAL | Status: DC
  Filled 2017-12-26 (×2): qty 1

## 2017-12-26 MED ORDER — VALACYCLOVIR HCL 500 MG PO TABS
500.0000 mg | ORAL_TABLET | ORAL | Status: DC
  Administered 2017-12-28: 500 mg via ORAL
  Filled 2017-12-26: qty 1

## 2017-12-26 MED ORDER — MAGNESIUM SULFATE 2 GM/50ML IV SOLN
2.0000 g | Freq: Once | INTRAVENOUS | Status: AC
  Administered 2017-12-26: 2 g via INTRAVENOUS
  Filled 2017-12-26: qty 50

## 2017-12-26 NOTE — Progress Notes (Addendum)
PROGRESS NOTE  [name] PPI:[id number] DOB: 05/18/1956 DOA: 12/24/2017 PCP: [name], PA-C  HPI/Recap of past 24 hours:   Report generalized itching, bilateral hands pain and bilateral foot numbness,  No fever, no visible tremor  Had bm x1 last night  She is a poor historian   Assessment/Plan: Active Problems:   Diabetes mellitus type 2, uncontrolled, with complications (HCC)   Anemia in stage 3 chronic kidney disease (HCC)   Essential hypertension   Obesity   CKD (chronic kidney disease) stage 4, GFR 15-29 ml/min (HCC)   Diabetic neuropathy (HCC)   Asthma   Diabetic hyperosmolar non-ketotic state ([place])   CAD (coronary artery disease)   Chronic diastolic heart failure (HCC)   AKI (acute kidney injury) ([place])   Hyperglycemia   Gout   AKI on CKDIV -with uremia bun 124, she appear drowsy, per chart review, she was noticed to be drowsy when she is seen at the gi clinic on 6/4 -she also has tremors recently for which she is referred to neurology for EEG -she reports recently started on new diuretics which has helped her leg edema, her blood glucose is elevated as well -ua no infection, no blood, no wbc -renal US no hydro, + medical renal disease -hold diuretics and continue gentle hydration, bun/cr slowing improving, repeat lab in am, -need to follow up with nephrology outpatient.  hypomagnesemia Presumably from diuretics She reports quit drinking alcohol for a long time Replace  Diastolic chf Currently dry Continue coreg, hold diuretics, on gentle hydration,  Strict intake and output, daily weight  CAD s/p stent on asa,  ticagrelor, statin and betablocker Denies chest pain  Insulin dependent dm2, uncontrolled with significant peripheral neuropathy a1c 11.5 Blood glucose 379 on presentation Titrate insulin Diabetes education  Anemia of chronic disease -Iron/b12/folate unremarkable -retic inappropriately low --FOBT pending collection -hgb  appear at baseline between 8-9 "Patient confirmed with RN should she need blood she would be willing to receive a blood transfusion although 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     HTN:  stable on coreg, hydralazine, imdur,  Hold diuretics  Gout ; continue home meds allopurinol, colchicine, ( renal dosing) I donot see any significant joint edema, will check uric acid   Chronic hepc  Per chart review, she is closely followed by GI, she is scheduled to have egd/colonoscopy, then plan for hep c treatment in the near future.  Outpatient F/u with gi clinc  H/o alcohol use, reports quit a long time ago  Morbid obesity: Body mass index is 45.55 kg/m.   FTT: get PT  Code Status: full  Family Communication: patient  Disposition Plan: not ready for discharge   Consultants:  Diabetes RN  PT  Procedures:  none  Antibiotics:  none   Objective: BP (!) 160/81 (BP Location: Right Arm)   Pulse 82   Temp 98.4 F (36.9 C) (Oral)   Resp 20   Ht 5\' 8"  (1.727 m)   Wt 135.9 kg (299 lb 9.7 oz)   SpO2 97%   BMI 45.55 kg/m   Intake/Output Summary (Last 24 hours) at 12/26/2017 0800 Last data filed at 12/26/2017 0500 Gross per 24 hour  Intake 1715 ml  Output 1150 ml  Net 565 ml   Filed Weights   12/25/17 0103  Weight: 135.9 kg (299 lb 9.7 oz)  Exam: Patient is examined daily including today on 12/26/2017, exams remain the same as of yesterday except that has changed    General:  Weak, obese,  Alert, oriented x3,   Cardiovascular: RRR  Respiratory: diminished at basis, no wheezing, no rales, no rhonchi  Abdomen: Soft/ND/NT, positive BS  Musculoskeletal: No Edema  Neuro: alert, oriented x3  Data Reviewed: Basic Metabolic Panel: Recent Labs  Lab 12/24/17 1809 12/25/17 0511 12/26/17 0532  NA 134* 136 138  K  3.7 3.7 3.7  CL 95* 98* 102  CO2 26 26 24   GLUCOSE 379* 353* 249*  BUN 121* 124* 109*  CREATININE 3.62* 3.50* 3.39*  CALCIUM 8.1* 7.6* 7.6*  MG  --  1.0* 1.4*  PHOS  --  4.1  --    Liver Function Tests: Recent Labs  Lab 12/24/17 1809 12/25/17 0511  AST 29 26  ALT 34 29  ALKPHOS 161* 143*  BILITOT 0.3 0.7  PROT 8.0 7.0  ALBUMIN 3.4* 3.0*   No results for input(s): LIPASE, AMYLASE in the last 168 hours. Recent Labs  Lab 12/26/17 0532  AMMONIA 21   CBC: Recent Labs  Lab 12/24/17 1809 12/25/17 0511 12/26/17 0532  WBC 10.9* 10.8* 8.0  NEUTROABS 7.8*  --  5.4  HGB 9.2* 8.4* 8.4*  HCT 29.6* 26.7* 27.0*  MCV 82.9 80.9 83.1  PLT 218 173 174   Cardiac Enzymes:   No results for input(s): CKTOTAL, CKMB, CKMBINDEX, TROPONINI in the last 168 hours. BNP (last 3 results) Recent Labs    07/15/17 1213 09/14/17 0710 10/03/17 1628  BNP 110.1* 195.0* 147.0*    ProBNP (last 3 results) Recent Labs    08/05/17 1557  PROBNP 401*    CBG: Recent Labs  Lab 12/25/17 1148 12/25/17 1648 12/25/17 1752 12/25/17 2130 12/26/17 0730  GLUCAP 393* 538* 447* 345* 271*    No results found for this or any previous visit (from the past 240 hour(s)).   Studies: US Renal  Result Date: 12/25/2017 CLINICAL DATA:  Acute renal failure EXAM: RENAL / URINARY TRACT ULTRASOUND COMPLETE COMPARISON:  09/14/2017 FINDINGS: Right Kidney: Length: 12.0 cm. Diffusely increased echotexture. No mass or hydronephrosis. Left Kidney: Length: 11.7 cm. Diffusely increased echotexture. No hydronephrosis. 17 mm cyst in the midpole. Bladder: Appears normal for degree of bladder distention. IMPRESSION: Increased echotexture bilaterally compatible with chronic medical renal disease. No hydronephrosis. Electronically Signed   By: Rolm Baptise M.D.   On: 12/25/2017 09:53    Scheduled Meds: . allopurinol  200 mg Oral Daily  . aspirin EC  81 mg Oral Daily  . atorvastatin  80 mg Oral QPC supper  . carvedilol   25 mg Oral BID WC  . colchicine  0.3 mg Oral Daily  . enoxaparin (LOVENOX) injection  30 mg Subcutaneous Daily  . ferrous sulfate  325 mg Oral Q breakfast  . hydrALAZINE  100 mg Oral TID  . insulin aspart  0-20 Units Subcutaneous TID WC  . insulin aspart  0-5 Units Subcutaneous QHS  . insulin aspart  10 Units Subcutaneous TID WC  . insulin glargine  60 Units Subcutaneous BID  . isosorbide mononitrate  30 mg Oral Daily  . loratadine  10 mg Oral Daily  . mometasone-formoterol  2 puff Inhalation BID  . montelukast  10 mg Oral QHS  . pantoprazole  40 mg Oral Daily  . polyethylene glycol  17 g Oral Daily  . pregabalin  300 mg Oral QHS  . senna-docusate  1  tablet Oral BID  . ticagrelor  90 mg Oral BID  . valACYclovir  500 mg Oral Daily    Continuous Infusions: . sodium chloride 75 mL/hr at 12/26/17 0500  . magnesium sulfate 1 - 4 g bolus IVPB       Time spent: 35 mins I have personally reviewed and interpreted on  12/26/2017 daily labs, tele strips, imagings as discussed above under date review session and assessment and plans.  I reviewed all nursing notes, vitals, pertinent old records  I have discussed plan of care as described above with RN , patient  on 12/26/2017   Florencia Reasons MD, PhD  [facility] Pager [phone]. If 7PM-7AM, please contact night-coverage at [email or web address], password [facility] 12/26/2017, 8:00 AM  LOS: 1 day

## 2017-12-27 ENCOUNTER — Encounter: Payer: Self-pay | Admitting: Physician Assistant

## 2017-12-27 DIAGNOSIS — N183 Chronic kidney disease, stage 3 (moderate): Secondary | ICD-10-CM

## 2017-12-27 DIAGNOSIS — D631 Anemia in chronic kidney disease: Secondary | ICD-10-CM

## 2017-12-27 LAB — BASIC METABOLIC PANEL
Anion gap: 11 (ref 5–15)
BUN: 108 mg/dL — ABNORMAL HIGH (ref 6–20)
CO2: 22 mmol/L (ref 22–32)
Calcium: 7.9 mg/dL — ABNORMAL LOW (ref 8.9–10.3)
Chloride: 109 mmol/L (ref 101–111)
Creatinine, Ser: 3.19 mg/dL — ABNORMAL HIGH (ref 0.44–1.00)
GFR calc Af Amer: 17 mL/min — ABNORMAL LOW (ref >60)
GFR calc non Af Amer: 15 mL/min — ABNORMAL LOW (ref >60)
Glucose, Bld: 190 mg/dL — ABNORMAL HIGH (ref 65–99)
Potassium: 3.6 mmol/L (ref 3.5–5.1)
Sodium: 142 mmol/L (ref 135–145)

## 2017-12-27 LAB — GLUCOSE, CAPILLARY
Glucose-Capillary: 176 mg/dL — ABNORMAL HIGH (ref 65–99)
Glucose-Capillary: 265 mg/dL — ABNORMAL HIGH (ref 65–99)
Glucose-Capillary: 340 mg/dL — ABNORMAL HIGH (ref 65–99)
Glucose-Capillary: 297 mg/dL — ABNORMAL HIGH (ref 65–99)

## 2017-12-27 LAB — OCCULT BLOOD X 1 CARD TO LAB, STOOL: Fecal Occult Bld: NEGATIVE

## 2017-12-27 LAB — SEDIMENTATION RATE: Sed Rate: 55 mm/hr — ABNORMAL HIGH (ref 0–22)

## 2017-12-27 LAB — URIC ACID: Uric Acid, Serum: 5.8 mg/dL (ref 2.3–6.6)

## 2017-12-27 MED ORDER — SENNOSIDES-DOCUSATE SODIUM 8.6-50 MG PO TABS: 1.0000 | ORAL_TABLET | Freq: Every day | ORAL | Status: DC

## 2017-12-27 MED ORDER — LIVING WELL WITH DIABETES BOOK
Freq: Once | Status: AC
Start: 2017-12-27 — End: 2017-12-27
  Administered 2017-12-27: 13:00:00
  Filled 2017-12-27: qty 1

## 2017-12-27 MED ORDER — HYDROXYZINE HCL 25 MG PO TABS
50.0000 mg | ORAL_TABLET | Freq: Two times a day (BID) | ORAL | Status: DC | PRN
  Administered 2017-12-27: 50 mg via ORAL
  Filled 2017-12-27: qty 2

## 2017-12-27 NOTE — Progress Notes (Signed)
Inpatient Diabetes Program Recommendations  AACE/ADA: New Consensus Statement on Inpatient Glycemic Control (2015)  Target Ranges:  Prepandial:   less than 140 mg/dL      Peak postprandial:   less than 180 mg/dL (1-2 hours)      Critically ill patients:  140 - 180 mg/dL   Lab Results  Component Value Date   GLUCAP 265 (H) 12/27/2017   HGBA1C 11.5 (A) 12/24/2017    Review of Glycemic Control  Spoke with pt at length regarding her glucose control and HgbA1C of 11.5%. Pt states she has not been taking care of herself in the past few months. Not monitoring blood sugars daily and regularly misses doses of Novolog. Pt states she eats 2 meals/day and does not do any exercises. Said she feels depressed d/t so many medical problems. Wants to lose weight and feel good. Instructed pt on importance of controlling her blood sugars to prevent long and short term complications. Discussed eating 3 meals/day and trying to do some walking every single day. Pt interested in joining [facility]. Has recently moved and lives alone at present. Discussed how diet, exercise and stress affect blood sugar control. Talked about CGM - Libre, and pt will discuss with her PCP or endo. Appt with endo was scheduled for today, but she has cancelled d/t hospitalizaiton. Much time spent encouraging pt to take care of herself and control blood sugars and she will likely feel much better and have more energy. Instructed to check blood sugars at least 3x/day and take logbook to MD for any needed insulin adjusments.  Ordered Living Well with Diabetes book and will give info on [place]. Will f/u in am with any questions. Needs a lot of encouragement.   Continue to follow.  Thank you. Lorenda Peck, RD, LDN, CDE Inpatient Diabetes Coordinator [phone]

## 2017-12-27 NOTE — Evaluation (Signed)
Occupational Therapy Evaluation Patient Details Name: [name] MRN: [id number] DOB: 1956-01-31 Today's Date: 12/27/2017    History of Present Illness 62 y.o. female with a known history of aortic stenosis, asthma, bronchitis, CHF, diabetes and chronic anemia with c/o fatigue/weakness and dx with AKI   Clinical Impression   Pt admitted with fatigue and weakness. Pt currently with functional limitations due to the deficits listed below (see OT Problem List).  Pt will benefit from skilled OT to increase their safety and independence with ADL and functional mobility for ADL to facilitate discharge to venue listed below.      Follow Up Recommendations  Home health OT;Supervision/Assistance - 24 hour    Equipment Recommendations  3 in 1 bedside commode       Precautions / Restrictions Precautions Precautions: Fall             ADL either performed or assessed with clinical judgement   ADL Overall ADL's : Needs assistance/impaired                                       General ADL Comments: Pt declined OOB with OT as she had just gotten back in bed. OT session focused on pts hands. Instructed and issued yellow theraputty for pts hands.  Educated pt this would help with strength in hands as well as potentially  decrease pain in joints . educated pt to use 3 times a day for 10 min.     Vision Patient Visual Report: No change from baseline              Pertinent Vitals/Pain Pain Assessment: 0-10 Pain Score: 2  Faces Pain Scale: Hurts a little bit Pain Location: hands Pain Descriptors / Indicators: Discomfort Pain Intervention(s): Limited activity within patient's tolerance     Hand Dominance Right   Extremity/Trunk Assessment Upper Extremity Assessment Upper Extremity Assessment: Generalized weakness(pt states she has pain in her hands.  Pt with good ROM but does have generalized weakness)           Communication Communication Communication: No  difficulties   Cognition Arousal/Alertness: Awake/alert Behavior During Therapy: Flat affect Overall Cognitive Status: Within Functional Limits for tasks assessed                                                Home Living Family/patient expects to be discharged to:: Private residence Living Arrangements: Children Available Help at Discharge: Family;Available PRN/intermittently Type of Home: House Home Access: Stairs to enter [employer] of Steps: 4 Entrance Stairs-Rails: Right;Left;Can reach both Home Layout: One level         Bathroom Toilet: Standard     Home Equipment: Environmental consultant - 2 wheels;Cane - single point   Additional Comments: has been independent at home, unable to be out of house recently but did not use AD      Prior Functioning/Environment Level of Independence: Independent with assistive device(s)                 OT Problem List: Decreased strength;Decreased activity tolerance;Impaired balance (sitting and/or standing);Obesity      OT Treatment/Interventions: Patient/family education;Therapeutic exercise;Therapeutic activities;Self-care/ADL training    OT Goals(Current goals can be found in the care plan section) Acute Rehab OT Goals Patient Stated  Goal: Regain IND OT Goal Formulation: With patient Time For Goal Achievement: 01/03/18  OT Frequency: Min 2X/week    AM-PAC PT "6 Clicks" Daily Activity     Outcome Measure Help from another person eating meals?: None Help from another person taking care of personal grooming?: None Help from another person toileting, which includes using toliet, bedpan, or urinal?: A Little Help from another person bathing (including washing, rinsing, drying)?: A Little Help from another person to put on and taking off regular upper body clothing?: A Little Help from another person to put on and taking off regular lower body clothing?: A Little 6 Click Score: 20   End of Session Nurse  Communication: Mobility status  Activity Tolerance: Patient tolerated treatment well Patient left: in bed  OT Visit Diagnosis: Unsteadiness on feet (R26.81);History of falling (Z91.81);Muscle weakness (generalized) (M62.81)                Time: 1044-1100 OT Time Calculation (min): 16 min Charges:  OT General Charges $OT Visit: 1 Visit OT Evaluation $OT Eval Moderate Complexity: 1 Mod G-Codes:     Kari Baars, [place]  Payton Mccallum D 12/27/2017, 12:46 PM

## 2017-12-27 NOTE — Progress Notes (Signed)
PROGRESS NOTE  [name] YIR:[id number] DOB: [birth year] DOA: 12/24/2017 PCP: [name], PA-C  HPI/Recap of past 24 hours:  Feeling better, appear more alert today She denies pain this am She had bm  No fever, no visible tremor, no edema   She reports she has been on daily valtrex for years, she reports she recently is started on colchicine for hands pain   Assessment/Plan: Active Problems:   Diabetes mellitus type 2, uncontrolled, with complications (HCC)   Anemia in stage 3 chronic kidney disease ([place])   Essential hypertension   Obesity   CKD (chronic kidney disease) stage 4, GFR 15-29 ml/min (HCC)   Diabetic neuropathy ([place])   Asthma   Diabetic hyperosmolar non-ketotic state ([place])   CAD (coronary artery disease)   Chronic diastolic heart failure (HCC)   AKI (acute kidney injury) ([place])   Hyperglycemia   Gout   AKI on CKDIV -with uremia bun 124, she appear drowsy, per chart review, she was noticed to be drowsy when she is seen at the gi clinic on 6/4 -she also has tremors recently for which she is referred to neurology for EEG -she reports recently started on new diuretics which has helped her leg edema, her blood glucose is elevated as well -ua no infection, no blood, no wbc -renal US no hydro, + medical renal disease -continue hold diuretics -she received  gentle hydration , will hold today -D/c colchicine - bun/cr slowing improving, repeat lab in am, -need to follow up with nephrology outpatient.  hypomagnesemia Presumably from diuretics She reports quit drinking alcohol for a long time Replace  Diastolic chf She presented with dehydartion Continue coreg, hold diuretics, volume improving, d/c  hydration,  Strict intake and output, daily weight  CAD s/p stent on asa,  ticagrelor, statin and betablocker Denies chest pain  Insulin dependent dm2, uncontrolled with significant peripheral neuropathy a1c 11.5 Blood glucose 379 on  presentation Titrate insulin Diabetes RN input appreciated, agree with libre monitoring, will benefit from ongoing outpatient diabetes education   Anemia of chronic disease -Iron/b12/folate unremarkable -retic inappropriately low --FOBT negative -hgb appear at baseline between 8-9 "Patient confirmed with RN should she need blood she would be willing to receive a blood transfusion although 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    HTN:  stable on coreg, hydralazine, imdur,  Hold diuretics  Gout ;  continue home meds allopurinol (renal dosing) colchicine discontinued, she does not appear in gout flare, no joint edema, uric acid 5.8    Chronic hepc  Per chart review, she is closely followed by GI, she is scheduled to have egd/colonoscopy, then plan for hep c treatment in the near future.  Outpatient F/u with gi clinc  H/o alcohol use, reports quit a long time ago  Morbid obesity: Body mass index is 47.3 kg/m.   FTT: get PT  Code Status: full  Family Communication: patient at bedside, daughter over the phone  Disposition Plan: home with home health , hopefully on 6/18   Consultants:  Diabetes RN  PT  Procedures:  none  Antibiotics:  none   Objective: BP (!) 155/64 (BP Location: Right Arm)   Pulse 80   Temp 98.1 F (36.7 C) (Oral)   Resp 18   Ht 5\' 8"  (1.727 m)   Wt (!) 141.1 kg (311 lb 1.1 oz)  SpO2 98%   BMI 47.30 kg/m   Intake/Output Summary (Last 24 hours) at 12/27/2017 1234 Last data filed at 12/27/2017 0600 Gross per 24 hour  Intake 240 ml  Output -  Net 240 ml   Filed Weights   12/25/17 0103 12/27/17 0521  Weight: 135.9 kg (299 lb 9.7 oz) (!) 141.1 kg (311 lb 1.1 oz)    Exam: Patient is examined daily including today on 12/27/2017, exams remain the same as of yesterday except that has changed     General:  Weak, obese,  Alert, oriented x3,   Cardiovascular: RRR  Respiratory: diminished at basis, no wheezing, no rales, no rhonchi  Abdomen: Soft/ND/NT, positive BS  Musculoskeletal: No Edema  Neuro: alert, oriented x3  Data Reviewed: Basic Metabolic Panel: Recent Labs  Lab 12/24/17 1809 12/25/17 0511 12/26/17 0532 12/27/17 0531  NA 134* 136 138 142  K 3.7 3.7 3.7 3.6  CL 95* 98* 102 109  CO2 26 26 24 22   GLUCOSE 379* 353* 249* 190*  BUN 121* 124* 109* 108*  CREATININE 3.62* 3.50* 3.39* 3.19*  CALCIUM 8.1* 7.6* 7.6* 7.9*  MG  --  1.0* 1.4*  --   PHOS  --  4.1  --   --    Liver Function Tests: Recent Labs  Lab 12/24/17 1809 12/25/17 0511  AST 29 26  ALT 34 29  ALKPHOS 161* 143*  BILITOT 0.3 0.7  PROT 8.0 7.0  ALBUMIN 3.4* 3.0*   No results for input(s): LIPASE, AMYLASE in the last 168 hours. Recent Labs  Lab 12/26/17 0532  AMMONIA 21   CBC: Recent Labs  Lab 12/24/17 1809 12/25/17 0511 12/26/17 0532  WBC 10.9* 10.8* 8.0  NEUTROABS 7.8*  --  5.4  HGB 9.2* 8.4* 8.4*  HCT 29.6* 26.7* 27.0*  MCV 82.9 80.9 83.1  PLT 218 173 174   Cardiac Enzymes:   No results for input(s): CKTOTAL, CKMB, CKMBINDEX, TROPONINI in the last 168 hours. BNP (last 3 results) Recent Labs    07/15/17 1213 09/14/17 0710 10/03/17 1628  BNP 110.1* 195.0* 147.0*    ProBNP (last 3 results) Recent Labs    08/05/17 1557  PROBNP 401*    CBG: Recent Labs  Lab 12/26/17 1216 12/26/17 1649 12/26/17 2153 12/27/17 0736 12/27/17 1137  GLUCAP 323* 360* 325* 176* 265*    No results found for this or any previous visit (from the past 240 hour(s)).   Studies: No results found.  Scheduled Meds: . allopurinol  200 mg Oral Daily  . aspirin EC  81 mg Oral Daily  . atorvastatin  80 mg Oral QPC supper  . carvedilol  25 mg Oral BID WC  . enoxaparin (LOVENOX) injection  30 mg Subcutaneous Daily  . ferrous sulfate  325 mg Oral Q breakfast  . hydrALAZINE  100 mg  Oral TID  . insulin aspart  0-20 Units Subcutaneous TID WC  . insulin aspart  0-5 Units Subcutaneous QHS  . insulin aspart  10 Units Subcutaneous TID WC  . insulin glargine  60 Units Subcutaneous BID  . isosorbide mononitrate  30 mg Oral Daily  . living well with diabetes book   Does not apply Once  . loratadine  10 mg Oral Daily  . mometasone-formoterol  2 puff Inhalation BID  . montelukast  10 mg Oral QHS  . pantoprazole  40 mg Oral Daily  . pregabalin  300 mg Oral QHS  . [START ON 12/28/2017] senna-docusate  1 tablet Oral QHS  .  ticagrelor  90 mg Oral BID  . [START ON 12/28/2017] valACYclovir  500 mg Oral Q48H    Continuous Infusions:    Time spent: 25 mins I have personally reviewed and interpreted on  12/27/2017 daily labs, tele strips, imagings as discussed above under date review session and assessment and plans.  I reviewed all nursing notes, vitals, pertinent old records  I have discussed plan of care as described above with RN , patient and daughter   on 12/27/2017   Florencia Reasons MD, PhD  [facility] Pager [phone]. If 7PM-7AM, please contact night-coverage at [email or web address], password [facility] 12/27/2017, 12:34 PM  LOS: 2 days

## 2017-12-27 NOTE — Progress Notes (Signed)
Physical Therapy Treatment/ Discharge Summary Patient Details Name: [name] MRN: [id number] DOB: 1955-08-15 Today's Date: 12/27/2017    History of Present Illness 62 y.o. female with a known history of aortic stenosis, asthma, bronchitis, CHF, diabetes and chronic anemia with c/o fatigue/weakness and dx with AKI    PT Comments    Patient ambulated 450 feet without AD with no overt LOB. Patient did report that she "looses her equilibrium at times." Patient is agreeable to OPPT for strengthening and balance training to improve her safety, increase strength and activity tolerance and decrease her risk for falls. Patient is making good progress with PT.  From a mobility standpoint anticipate patient will be ready for DC home today.  Patient has been DC'ed from PT. All goals met and all education completed. Thank you for the referral.     Follow Up Recommendations  Outpatient PT     Equipment Recommendations  None recommended by PT    Recommendations for Other Services       Precautions / Restrictions Precautions Precautions: Fall    Mobility  Bed Mobility Overal bed mobility: Independent             General bed mobility comments: Pt to/from EOB unassisted  Transfers Overall transfer level: Independent Equipment used: None Transfers: Sit to/from [employer] to Stand: Supervision Stand pivot transfers: Supervision          Ambulation/Gait Ambulation/Gait assistance: Min assist Gait Distance (Feet): 450 Feet Assistive device: None Gait Pattern/deviations: Step-through pattern;Wide base of support Gait velocity: decreased   General Gait Details: no overt LOB, good endurance for mobility.   Stairs             Wheelchair Mobility    Modified Rankin (Stroke Patients Only)       Balance Overall balance assessment: Needs assistance Sitting-balance support: No upper extremity supported;Feet supported Sitting balance-Leahy Scale:  Good     Standing balance support: No upper extremity supported Standing balance-Leahy Scale: Fair                              Cognition Arousal/Alertness: Awake/alert Behavior During Therapy: WFL for tasks assessed/performed Overall Cognitive Status: Within Functional Limits for tasks assessed                                        Exercises      General Comments        Pertinent Vitals/Pain Pain Assessment: No/denies pain    Home Living Family/patient expects to be discharged to:: Private residence Living Arrangements: Children Available Help at Discharge: Family;Available PRN/intermittently Type of Home: House Home Access: Stairs to enter Entrance Stairs-Rails: Right;Left;Can reach both Home Layout: One level Home Equipment: Environmental consultant - 2 wheels;Cane - single point Additional Comments: has been independent at home, unable to be out of house recently but did not use AD    Prior Function Level of Independence: Independent with assistive device(s)          PT Goals (current goals can now be found in the care plan section) Acute Rehab PT Goals Patient Stated Goal: Regain IND Progress towards PT goals: Goals met/education completed, patient discharged from PT    Frequency    Min 3X/week      PT Plan Discharge plan needs to be updated    Co-evaluation  AM-PAC PT "6 Clicks" Daily Activity  Outcome Measure  Difficulty turning over in bed (including adjusting bedclothes, sheets and blankets)?: A Little Difficulty moving from lying on back to sitting on the side of the bed? : A Little Difficulty sitting down on and standing up from a chair with arms (e.g., wheelchair, bedside commode, etc,.)?: None Help needed moving to and from a bed to chair (including a wheelchair)?: None Help needed walking in hospital room?: A Little Help needed climbing 3-5 steps with a railing? : A Little 6 Click Score: 20    End of  Session   Activity Tolerance: Patient tolerated treatment well Patient left: with call bell/phone within reach;in chair Nurse Communication: Mobility status PT Visit Diagnosis: Unsteadiness on feet (R26.81);Difficulty in walking, not elsewhere classified (R26.2)     Time: 4401-0272 PT Time Calculation (min) (ACUTE ONLY): 30 min  Charges:  $Gait Training: 23-37 mins                    G Codes:       Kamden Reber D. Hartnett-Rands, MS, PT Per [place] [phone] 12/27/2017, 2:25 PM

## 2017-12-28 ENCOUNTER — Ambulatory Visit: Payer: Medicaid Other | Admitting: General Surgery

## 2017-12-28 ENCOUNTER — Telehealth: Payer: Self-pay

## 2017-12-28 ENCOUNTER — Ambulatory Visit: Payer: Medicaid Other | Admitting: Cardiology

## 2017-12-28 DIAGNOSIS — E1165 Type 2 diabetes mellitus with hyperglycemia: Secondary | ICD-10-CM

## 2017-12-28 DIAGNOSIS — IMO0002 Reserved for concepts with insufficient information to code with codable children: Secondary | ICD-10-CM

## 2017-12-28 DIAGNOSIS — E118 Type 2 diabetes mellitus with unspecified complications: Secondary | ICD-10-CM

## 2017-12-28 LAB — CBC WITH DIFFERENTIAL/PLATELET
Basophils Absolute: 0 10*3/uL (ref 0.0–0.1)
Basophils Relative: 0 %
Eosinophils Absolute: 0.3 10*3/uL (ref 0.0–0.7)
Eosinophils Relative: 4 %
HCT: 25.8 % — ABNORMAL LOW (ref 36.0–46.0)
Hemoglobin: 8 g/dL — ABNORMAL LOW (ref 12.0–15.0)
Lymphocytes Relative: 21 %
Lymphs Abs: 1.5 10*3/uL (ref 0.7–4.0)
MCH: 26 pg (ref 26.0–34.0)
MCHC: 31 g/dL (ref 30.0–36.0)
MCV: 83.8 fL (ref 78.0–100.0)
Monocytes Absolute: 0.5 10*3/uL (ref 0.1–1.0)
Monocytes Relative: 7 %
Neutro Abs: 4.8 10*3/uL (ref 1.7–7.7)
Neutrophils Relative %: 68 %
Platelets: 158 10*3/uL (ref 150–400)
RBC: 3.08 MIL/uL — ABNORMAL LOW (ref 3.87–5.11)
RDW: 18.4 % — ABNORMAL HIGH (ref 11.5–15.5)
WBC: 7.2 10*3/uL (ref 4.0–10.5)

## 2017-12-28 LAB — BASIC METABOLIC PANEL
Anion gap: 10 (ref 5–15)
BUN: 90 mg/dL — ABNORMAL HIGH (ref 6–20)
CO2: 22 mmol/L (ref 22–32)
Calcium: 7.7 mg/dL — ABNORMAL LOW (ref 8.9–10.3)
Chloride: 108 mmol/L (ref 101–111)
Creatinine, Ser: 2.85 mg/dL — ABNORMAL HIGH (ref 0.44–1.00)
GFR calc Af Amer: 20 mL/min — ABNORMAL LOW (ref >60)
GFR calc non Af Amer: 17 mL/min — ABNORMAL LOW (ref >60)
Glucose, Bld: 251 mg/dL — ABNORMAL HIGH (ref 65–99)
Potassium: 3.6 mmol/L (ref 3.5–5.1)
Sodium: 140 mmol/L (ref 135–145)

## 2017-12-28 LAB — RHEUMATOID FACTOR: Rhuematoid fact SerPl-aCnc: 12.7 IU/mL (ref 0.0–13.9)

## 2017-12-28 LAB — GLUCOSE, CAPILLARY
Glucose-Capillary: 226 mg/dL — ABNORMAL HIGH (ref 65–99)
Glucose-Capillary: 240 mg/dL — ABNORMAL HIGH (ref 65–99)

## 2017-12-28 LAB — MAGNESIUM: Magnesium: 1.5 mg/dL — ABNORMAL LOW (ref 1.7–2.4)

## 2017-12-28 LAB — ANTINUCLEAR ANTIBODIES, IFA: ANA Ab, IFA: NEGATIVE

## 2017-12-28 MED ORDER — VALACYCLOVIR HCL 500 MG PO TABS: 500.0000 mg | ORAL_TABLET | ORAL | 0 refills | Status: DC

## 2017-12-28 MED ORDER — SENNOSIDES-DOCUSATE SODIUM 8.6-50 MG PO TABS: 1.0000 | ORAL_TABLET | Freq: Every day | ORAL | 0 refills | Status: DC

## 2017-12-28 MED ORDER — FERROUS SULFATE 325 (65 FE) MG PO TABS: 325.0000 mg | ORAL_TABLET | ORAL | 0 refills | Status: DC

## 2017-12-28 MED ORDER — PREGABALIN 150 MG PO CAPS: 150.0000 mg | ORAL_CAPSULE | Freq: Every day | ORAL | 0 refills | Status: DC

## 2017-12-28 MED ORDER — INSULIN GLARGINE 100 UNIT/ML SOLOSTAR PEN: 60.0000 [IU] | PEN_INJECTOR | Freq: Two times a day (BID) | SUBCUTANEOUS | 0 refills | Status: DC

## 2017-12-28 MED ORDER — ALLOPURINOL 100 MG PO TABS: 200.0000 mg | ORAL_TABLET | Freq: Every day | ORAL | 0 refills | Status: DC

## 2017-12-28 MED ORDER — MAGNESIUM SULFATE IN D5W 1-5 GM/100ML-% IV SOLN
1.0000 g | Freq: Once | INTRAVENOUS | Status: AC
  Administered 2017-12-28: 1 g via INTRAVENOUS
  Filled 2017-12-28: qty 100

## 2017-12-28 NOTE — Discharge Summary (Signed)
Discharge Summary  [name] TGP:[id number] DOB: May 23, 1956  PCP: [name], PA-C  Admit date: 12/24/2017 Discharge date: 12/28/2017  Time spent: 64mns, more than 50% time spent on coordination of care.  Recommendations for Outpatient Follow-up:  1. F/u with PMD within a week  for hospital discharge follow up, repeat cbc/bmp at follow up.  All her home meds need to be renally dosed.  2. F/u with nephrology in one week for progressive ckdIV  Discharge Diagnoses:  Active Hospital Problems   Diagnosis Date Noted  . Gout 12/25/2017  . AKI (acute kidney injury) ([facility] 12/24/2017  . Hyperglycemia 12/24/2017  . Chronic diastolic heart failure ([facility] 09/30/2017  . CAD (coronary artery disease) 12/13/2016  . Diabetic hyperosmolar non-ketotic state ([facility] [place] 12/13/2016  . Asthma 11/29/2015  . Diabetic neuropathy ([facility] 01/31/2015  . CKD (chronic kidney disease) stage 4, GFR 15-29 ml/min (HCC) 10/08/2014  . Essential hypertension 10/08/2014  . Obesity 10/08/2014  . Anemia in stage 3 chronic kidney disease ([facility] 03/25/2012  . Diabetes mellitus type 2, uncontrolled, with complications ([facility] 030/94/[id number]   Class: Chronic    Resolved Hospital Problems  No resolved problems to display.    Discharge Condition: stable  Diet recommendation: heart healthy/carb modified  Filed Weights   12/25/17 0103 12/27/17 0521 12/28/17 0514  Weight: 135.9 kg (299 lb 9.7 oz) (!) 141.1 kg (311 lb 1.1 oz) (!) 141.3 kg (311 lb 8 oz)    History of present illness: (per Admitting MD Dr DRoel Cluck HPI: ASHERRI MCARTHYis a 62y.o. female with medical history significant of poorly controlled diabetes, CKD chronic hepatitis C anemia of chronic disease, aortic stenosis, diastolic CHF, CAD, depression, GERD, gout OSA,     Presented with she was seen by her primary care provider today noted to have elevated creatinine and was sent to emergency department for further management She has known history of  chronic kidney disease with creatinine baseline around 2. She has been having generalized fatigue and feeling like her legs are giving out on her. She describes severe pain in right great toe similar to prior gout attacks for the past 2 weeks  Reports RUQ pain worse with movement and palpation.  Pt is Sp cholecysteectomy Regarding pertinent Chronic problems: Poorly controlled diabetes hemoglobin A1c 10.7 in May on Metformin,Trulicity, Lantus and Novolog History of CAD status post DES in March 2018 Echogram in 2018 showed grade 2 diastolic dysfunction  Hx of Chronic anemia have accepted blood transfusion in the past.  While in ER:   Following Medications were ordered in ER: Medications  sodium chloride 0.9 % bolus 1,000 mL (has no administration in time range)    Significant initial  Findings:      Abnormal Labs Reviewed  COMPREHENSIVE METABOLIC PANEL - Abnormal; Notable for the following components:      Result Value    Sodium 134 (*)    Chloride 95 (*)    Glucose, Bld 379 (*)    BUN 121 (*)    Creatinine, Ser 3.62 (*)    Calcium 8.1 (*)    Albumin 3.4 (*)    Alkaline Phosphatase 161 (*)    GFR calc non Af Amer 13 (*)    GFR calc Af Amer 15 (*)    All other components within normal limits  CBC WITH DIFFERENTIAL/PLATELET - Abnormal; Notable for the following components:   WBC 10.9 (*)    RBC 3.57 (*)    Hemoglobin 9.2 (*)  HCT 29.6 (*)    MCH 25.8 (*)    RDW 17.8 (*)    Neutro Abs 7.8 (*)    All other components within normal limits  URINALYSIS, ROUTINE W REFLEX MICROSCOPIC - Abnormal; Notable for the following components:   Protein, ur 30 (*)    Bacteria, UA RARE (*)    All other components within normal limits  BLOOD GAS, VENOUS - Abnormal; Notable for the following components:   pH, Ven 7.436 (*)    pCO2, Ven 38.5 (*)    All other components within normal limits  CBG MONITORING, ED - Abnormal; Notable for the  following components:   Glucose-Capillary 365 (*)    All other components within normal limits  CBG MONITORING, ED - Abnormal; Notable for the following components:   Glucose-Capillary 337 (*)    All other components within normal limits    VBG 7.436/48.5 Na 134 K 3.7  Cr Up from baseline see below RecentLabs       Lab Results  Component Value Date   CREATININE 3.62 (H) 12/24/2017   CREATININE 2.70 (H) 10/11/2017   CREATININE 2.58 (H) 10/10/2017        WBC 10.9   HG/HCT stable,    Labs(Brief)          Component Value Date/Time   HGB 9.2 (L) 12/24/2017 1809   HGB 9.0 (L) 09/01/2017 1655   HCT 29.6 (L) 12/24/2017 1809   HCT 29.2 (L) 09/01/2017 1655      BNP (last 3 results) RecentLabs(withinlast365days)       Recent Labs    07/15/17 1213 09/14/17 0710 10/03/17 1628  BNP 110.1* 195.0* 147.0*      ProBNP (last 3 results) RecentLabs(withinlast365days)     Recent Labs    08/05/17 1557  PROBNP 401*      Lactic Acid, Venous Labs(Brief)          Component Value Date/Time   LATICACIDVEN 0.99 12/24/2017 1833        UA   no evidence of UTI      ECG:   not ordered         ED Triage Vitals  Enc Vitals Group     BP 12/24/17 1723 (!) 170/83     Pulse Rate 12/24/17 1723 95     Resp 12/24/17 1723 18     Temp 12/24/17 1723 97.6 F (36.4 C)     Temp Source 12/24/17 1723 Oral     SpO2 12/24/17 1723 98 %     Weight --      Height --      Head Circumference --      Peak Flow --      Pain Score 12/24/17 1729 9     Pain Loc --      Pain Edu? --      Excl. in [place]? --   TMAX(24)@       Latest  Blood pressure (!) 161/78, pulse 94, temperature 98.8 F (37.1 C), temperature source Oral, resp. rate 13, SpO2 99 %.    Hospitalist was called for admission for AKI in the setting of hyperglycemia      Hospital Course:  Active Problems:   Diabetes mellitus type 2, uncontrolled, with  complications (HCC)   Anemia in stage 3 chronic kidney disease (HCC)   Essential hypertension   Obesity   CKD (chronic kidney disease) stage 4, GFR 15-29 ml/min (HCC)   Diabetic neuropathy ([place])   Asthma  Diabetic hyperosmolar non-ketotic state ([place])   CAD (coronary artery disease)   Chronic diastolic heart failure (HCC)   AKI (acute kidney injury) ([place])   Hyperglycemia   Gout   AKI on CKDIV -with uremia bun 124/cr 3.6, she appear drowsy on presentation -per chart review, she was noticed to be drowsy when she is seen at the gi clinic on 6/4 -she also has tremors recently for which she is referred to neurology for EEG - I suspect progressive ckd and uremia at least partially contribute to these symptoms.  -she reports recently started on new diuretics which has helped her leg edema, her blood glucose is elevated as well -ua no infection, no blood, no wbc -renal US no hydro, + medical renal disease -continue hold diuretics -she received  gentle hydration  -D/c colchicine, all meds need to be renally dosed - bun/cr slowing improving, bun90/cr 2.8 at discharge. -need to follow up with nephrology outpatient closely.  hypomagnesemia Presumably from diuretics She reports quit drinking alcohol for a long time Replaced  Diastolic chf She presented with dehydration, she received gentle hydration, diuretics held in the hospital, resumed at discharge Continue coreg,   Strict intake and output, daily weight  CAD s/p stent on asa,  ticagrelor, statin and betablocker Denies chest pain  Insulin dependent dm2, uncontrolled with significant peripheral neuropathy a1c 11.5 Blood glucose 379 on presentation She reports she does not check her blood glucose regularly Titrate insulin Diabetes RN input appreciated, agree with libre monitoring, will benefit from ongoing outpatient diabetes education   Anemia of chronic disease -Iron/b12/folate unremarkable -retic inappropriately  low --FOBT negative -hgb appear at baseline between 8-9 "Patient confirmed with RN should she need blood she would be willing to receive a blood transfusion although 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    HTN:  stable on coreg, hydralazine, imdur,  diuretics held in the hospital, resumed at discharge.  Gout ;  continue home meds allopurinol (renal dosing) colchicine discontinued, she does not appear in gout flare, no joint edema, uric acid 5.8    Chronic hepc  Per chart review, she is closely followed by GI, she is scheduled to have egd/colonoscopy, then plan for hep c treatment in the near future.  Outpatient F/u with gi clinc  H/o alcohol use, reports quit a long time ago  Morbid obesity: Body mass index is 47.3 kg/m.   FTT: improved, PT eval recommended outpatient PT   Code Status: full  Family Communication: patient at bedside, daughter over the phone   Disposition Plan: home on 6/18   Consultants:  Diabetes RN  PT  Procedures:  none  Antibiotics:  none   Discharge Exam: BP 128/70 (BP Location: Left Wrist)   Pulse 85   Temp 98.6 F (37 C)   Resp 20   Ht _0  (1.727 m)   Wt (!) 141.3 kg (311 lb 8 oz)   SpO2 95%   BMI 47.36 kg/m     General: NAD, fully alert, obese,    Cardiovascular: RRR  Respiratory: diminished at basis, no wheezing, no rales, no rhonchi  Abdomen: Soft/ND/NT, positive BS  Musculoskeletal: No Edema  Neuro: alert, oriented x3    Discharge Instructions You were cared for by a hospitalist during your hospital stay. If you have any questions about your discharge medications or the care you received while you  were in the hospital after you are discharged, you can call the unit and asked to speak with the hospitalist on call if the hospitalist that took care  of you is not available. Once you are discharged, your primary care physician will handle any further medical issues. Please note that NO REFILLS for any discharge medications will be authorized once you are discharged, as it is imperative that you return to your primary care physician (or establish a relationship with a primary care physician if you do not have one) for your aftercare needs so that they can reassess your need for medications and monitor your lab values.  Discharge Instructions    Ambulatory referral to Nutrition and Diabetic Education   Complete by:  As directed    Ambulatory referral to Physical Therapy   Complete by:  As directed    Diet - low sodium heart healthy   Complete by:  As directed    Carb modified   Increase activity slowly   Complete by:  As directed      Allergies as of 12/28/2017      Reactions   Shellfish Allergy Anaphylaxis, Swelling   Diazepam Other (See Comments)   "felt like out of body experience"   Morphine And Related Itching      Medication List    STOP taking these medications   ketotifen 0.025 % ophthalmic solution Commonly known as:  ZADITOR   MITIGARE 0.6 MG Caps Generic drug:  Colchicine     TAKE these medications   ACCU-CHEK AVIVA device Use as instructed daily.   accu-chek softclix lancets Use as instructed daily.   Adult Blood Pressure Cuff Lg Kit Check blood pressure once daily   albuterol (2.5 MG/3ML) 0.083% nebulizer solution Commonly known as:  PROVENTIL Take 3 mLs (2.5 mg total) by nebulization every 6 (six) hours as needed for wheezing or shortness of breath.   albuterol 108 (90 Base) MCG/ACT inhaler Commonly known as:  VENTOLIN HFA Inhale 2 puffs into the lungs every 4 (four) hours as needed for wheezing or shortness of breath.   allopurinol 100 MG tablet Commonly known as:  ZYLOPRIM Take 2 tablets (200 mg total) by mouth daily. Start taking on:  12/29/2017 What changed:    medication strength  how much  to take   aspirin 81 MG EC tablet Take 1 tablet (81 mg total) by mouth daily.   atorvastatin 80 MG tablet Commonly known as:  LIPITOR Take 1 tablet (80 mg total) by mouth daily at 6 PM. What changed:  when to take this   carvedilol 25 MG tablet Commonly known as:  COREG TAKE 1 TABLET BY MOUTH 2 TIMES DAILY WITH A MEAL. What changed:    how much to take  how to take this  when to take this  additional instructions   docusate sodium 100 MG capsule Commonly known as:  COLACE Take 1 capsule (100 mg total) by mouth 2 (two) times daily as needed for mild constipation.   Dulaglutide 0.75 MG/0.5ML Sopn Commonly known as:  TRULICITY Inject 4.40 mg into the skin once a week. What changed:  additional instructions   ferrous sulfate 325 (65 FE) MG tablet Take 1 tablet (325 mg total) by mouth every other day. What changed:  when to take this   fluticasone 50 MCG/ACT nasal spray Commonly known as:  FLONASE Place 2 sprays into both nostrils daily as needed for allergies or rhinitis.   Fluticasone-Salmeterol 250-50 MCG/DOSE Aepb Commonly known as:  ADVAIR DISKUS Inhale 1 puff into the lungs 2 (two) times daily.   glucose blood test strip Commonly known as:  TRUE METRIX BLOOD GLUCOSE TEST Use as instructed   glucose blood test strip Commonly known as:  ACCU-CHEK AVIVA Use as instructed daily   hydrALAZINE 100 MG tablet Commonly known as:  APRESOLINE Take 100 mg by mouth 3 (three) times daily. What changed:  Another medication with the same name was removed. Continue taking this medication, and follow the directions you see here.   hydrOXYzine 50 MG tablet Commonly known as:  ATARAX/VISTARIL Take 1 tablet (50 mg total) by mouth 2 (two) times daily as needed. What changed:  when to take this   insulin aspart 100 UNIT/ML FlexPen Commonly known as:  NOVOLOG Inject 10 Units into the skin 3 (three) times daily with meals.   Insulin Glargine 100 UNIT/ML Solostar  Pen Commonly known as:  LANTUS SOLOSTAR Inject 60 Units into the skin 2 (two) times daily. What changed:  how much to take   isosorbide mononitrate 30 MG 24 hr tablet Commonly known as:  IMDUR TAKE 3 TABLETS(90 MG) BY MOUTH DAILY   loratadine 10 MG tablet Commonly known as:  CLARITIN Take 1 tablet (10 mg total) by mouth daily.   metolazone 2.5 MG tablet Commonly known as:  ZAROXOLYN Take 1 tablet (2.5 mg total) by mouth daily as needed (swelling).   montelukast 10 MG tablet Commonly known as:  SINGULAIR Take 1 tablet (10 mg total) by mouth at bedtime.   nitroGLYCERIN 0.4 MG SL tablet Commonly known as:  NITROSTAT Place 1 tablet (0.4 mg total) under the tongue every 5 (five) minutes x 3 doses as needed for chest pain.   omeprazole 20 MG capsule Commonly known as:  PRILOSEC Take 1 capsule (20 mg total) by mouth daily.   Pen Needles 30G X 8 MM Misc 1 each by Does not apply route 3 (three) times daily before meals. AC for novolog injections   potassium chloride SA 20 MEQ tablet Commonly known as:  K-DUR,KLOR-CON Take 1 tablet (20 mEq total) by mouth 2 (two) times daily.   pregabalin 150 MG capsule Commonly known as:  LYRICA Take 1 capsule (150 mg total) by mouth at bedtime. What changed:    medication strength  how much to take   senna-docusate 8.6-50 MG tablet Commonly known as:  Senokot-S Take 1 tablet by mouth at bedtime.   ticagrelor 90 MG Tabs tablet Commonly known as:  BRILINTA Take 1 tablet (90 mg total) by mouth 2 (two) times daily.   torsemide 20 MG tablet Commonly known as:  DEMADEX TAKE (2) TABLETS BY MOUTH TWICE DAILY.   valACYclovir 500 MG tablet Commonly known as:  VALTREX Take 1 tablet (500 mg total) by mouth every other day. What changed:  when to take this   Vitamin D (Ergocalciferol) 50000 units Caps capsule Commonly known as:  DRISDOL TAKE 1 CAPSULE BY MOUTH ONCE A MONTH      Allergies  Allergen Reactions  . Shellfish Allergy  Anaphylaxis and Swelling  . Diazepam Other (See Comments)    "felt like out of body experience"  . Morphine And Related Itching   Follow-up Information    [name], PA-C Follow up in 1 week(s).   Specialty:  Family Medicine Why:  hospital discharge follow up. pcp to repeat cbc/bmp at hospital follow up. pcp to ensure patient follow up with nephrology closely to monitor progression of ckd. pcp to set  up libre device for close blood glucose monitor. Contact information: [address] [place] [address] [phone]        Lavonia Dana, MD Follow up in 1 week(s).   Specialty:  Internal Medicine Why:  progressive ckd/renal failure Contact information: [address] [place] [address] [phone]            The results of significant diagnostics from this hospitalization (including imaging, microbiology, ancillary and laboratory) are listed below for reference.    Significant Diagnostic Studies: Dg Knee 1-2 Views Right  Result Date: 12/25/2017 CLINICAL DATA:  Right knee pain EXAM: RIGHT KNEE - 1-2 VIEW COMPARISON:  None. FINDINGS: No fracture or malalignment. Moderate arthritis of the medial compartment. Mild patellofemoral degenerative changes. No significant knee effusion IMPRESSION: Mild to moderate arthritis of the knee. No acute osseous abnormality. Electronically Signed   By: Donavan Foil M.D.   On: 12/25/2017 01:58   US Renal  Result Date: 12/25/2017 CLINICAL DATA:  Acute renal failure EXAM: RENAL / URINARY TRACT ULTRASOUND COMPLETE COMPARISON:  09/14/2017 FINDINGS: Right Kidney: Length: 12.0 cm. Diffusely increased echotexture. No mass or hydronephrosis. Left Kidney: Length: 11.7 cm. Diffusely increased echotexture. No hydronephrosis. 17 mm cyst in the midpole. Bladder: Appears normal for degree of bladder distention. IMPRESSION: Increased echotexture bilaterally compatible with chronic medical renal disease. No  hydronephrosis. Electronically Signed   By: Rolm Baptise M.D.   On: 12/25/2017 09:53   Dg Foot 2 Views Right  Result Date: 12/25/2017 CLINICAL DATA:  RIGHT big toe pain for 2 days. No injury. History of diabetes. EXAM: RIGHT FOOT - 2 VIEW COMPARISON:  RIGHT foot radiograph April 11, 2017 FINDINGS: No acute fracture deformity or dislocation. Chronic deformity fifth PIP, probable old injury. No destructive bony lesions. Soft tissue planes are not suspicious. IMPRESSION: Stable examination, no acute osseous process. Electronically Signed   By: Elon Alas M.D.   On: 12/25/2017 00:47   US Liver Doppler  Result Date: 12/16/2017 CLINICAL DATA:  Chronic hepatitis-C. EXAM: DUPLEX ULTRASOUND OF LIVER TECHNIQUE: Color and duplex Doppler ultrasound was performed to evaluate the hepatic in-flow and out-flow vessels. COMPARISON:  None. FINDINGS: Liver: Normal parenchymal echogenicity. Normal hepatic contour without nodularity. No focal lesion, mass or intrahepatic biliary ductal dilatation. Portal Vein Velocities Main:  35 cm/sec Right:  23 cm/sec Left:  11 cm/sec Normal hepatopetal flow is noted in the portal veins. Hepatic Vein Velocities Right:  30 cm/sec Middle:  27 cm/sec Left:  33 cm/sec Normal hepatopetal flow is noted in the hepatic veins. IVC: Present and patent with normal respiratory phasicity. Hepatic Artery Velocity:  53 cm/sec Splenic Vein Velocity:  22 cm/sec Varices: Absent. Ascites: Absent. Spleen appears to be normal in size and appearance. IMPRESSION: No evidence of hepatic, portal or splenic venous thrombosis or occlusion. Electronically Signed   By: Marijo Conception, M.D.   On: 12/16/2017 16:38    Microbiology: No results found for this or any previous visit (from the past 240 hour(s)).   Labs: Basic Metabolic Panel: Recent Labs  Lab 12/24/17 1809 12/25/17 0511 12/26/17 0532 12/27/17 0531 12/28/17 0545  NA 134* 136 138 142 140  K 3.7 3.7 3.7 3.6 3.6  CL 95* 98* 102 109 108   CO2 _0 GLUCOSE 379* 353* 249* 190* 251*  BUN 121* 124* 109* 108* 90*  CREATININE 3.62* 3.50* 3.39* 3.19* 2.85*  CALCIUM 8.1* 7.6* 7.6* 7.9* 7.7*  MG  --  1.0* 1.4*  --  1.5*  PHOS  --  4.1  --   --   --    Liver Function Tests: Recent Labs  Lab 12/24/17 1809 12/25/17 0511  AST 29 26  ALT 34 29  ALKPHOS 161* 143*  BILITOT 0.3 0.7  PROT 8.0 7.0  ALBUMIN 3.4* 3.0*   No results for input(s): LIPASE, AMYLASE in the last 168 hours. Recent Labs  Lab 12/26/17 0532  AMMONIA 21   CBC: Recent Labs  Lab 12/24/17 1809 12/25/17 0511 12/26/17 0532 12/28/17 0545  WBC 10.9* 10.8* 8.0 7.2  NEUTROABS 7.8*  --  5.4 4.8  HGB 9.2* 8.4* 8.4* 8.0*  HCT 29.6* 26.7* 27.0* 25.8*  MCV 82.9 80.9 83.1 83.8  PLT 218 173 174 158   Cardiac Enzymes: No results for input(s): CKTOTAL, CKMB, CKMBINDEX, TROPONINI in the last 168 hours. BNP: BNP (last 3 results) Recent Labs    07/15/17 1213 09/14/17 0710 10/03/17 1628  BNP 110.1* 195.0* 147.0*    ProBNP (last 3 results) Recent Labs    08/05/17 1557  PROBNP 401*    CBG: Recent Labs  Lab 12/27/17 0736 12/27/17 1137 12/27/17 1633 12/27/17 2049 12/28/17 0734  GLUCAP 176* 265* 340* 297* 226*       Signed:  Florencia Reasons MD, PhD  [facility] 12/28/2017, 11:25 AM

## 2017-12-28 NOTE — Care Management Note (Signed)
Case Management Note  Patient Details  Name: [name] MRN: [id number] Date of Birth: 08-02-1955  Subjective/Objective:  PT-otpt PT. MD provided script for otpt PT. No further CM needs.                  Action/Plan:d/c home.   Expected Discharge Date:  12/28/17               Expected Discharge Plan:  Home/Self Care  In-House Referral:     Discharge planning Services  CM Consult  Post Acute Care Choice:    Choice offered to:     DME Arranged:    DME Agency:     HH Arranged:    HH Agency:     Status of Service:  Completed, signed off  If discussed at [employer] of Stay Meetings, dates discussed:    Additional Comments:  Dessa Phi, RN 12/28/2017, 11:25 AM

## 2017-12-28 NOTE — Progress Notes (Signed)
Patient is stable at discharge. Patient verbalized understanding of discharge instructions. AVS was given.

## 2017-12-28 NOTE — Telephone Encounter (Signed)
Patient requesting Freestyle Libre glucose meter be ordered at [facility]

## 2017-12-28 NOTE — Progress Notes (Signed)
Inpatient Diabetes Program Recommendations  AACE/ADA: New Consensus Statement on Inpatient Glycemic Control (2015)  Target Ranges:  Prepandial:   less than 140 mg/dL      Peak postprandial:   less than 180 mg/dL (1-2 hours)      Critically ill patients:  140 - 180 mg/dL   Lab Results  Component Value Date   GLUCAP 226 (H) 12/28/2017   HGBA1C 11.5 (A) 12/24/2017    Review of Glycemic Control  FBS this am 251. Pt takes Lantus 48 units bid at home. Needs insulin adjustment.  Inpatient Diabetes Program Recommendations:     Increase Lantus to 70 units QAM Increase Novolog to 15 units tidwc  Will give info on [place] prior to discharge.  Thank you. Lorenda Peck, RD, LDN, CDE Inpatient Diabetes Coordinator [phone]

## 2017-12-28 NOTE — Progress Notes (Signed)
Occupational Therapy Treatment Patient Details Name: [name] MRN: [id number] DOB: 07-20-1955 Today's Date: 12/28/2017    History of present illness 62 y.o. female with a known history of aortic stenosis, asthma, bronchitis, CHF, diabetes and chronic anemia with c/o fatigue/weakness and dx with AKI   OT comments  Pt plans to DC this day/ daughter will A as needed  Follow Up Recommendations  Home health OT;Supervision/Assistance - 24 hour    Equipment Recommendations  3 in 1 bedside commode       Precautions / Restrictions Precautions Precautions: Fall       Mobility Bed Mobility Overal bed mobility: Independent                Transfers Overall transfer level: Independent Equipment used: None Transfers: Sit to/from [employer] to Stand: Supervision Stand pivot transfers: Supervision            Balance Overall balance assessment: Needs assistance Sitting-balance support: No upper extremity supported;Feet supported Sitting balance-Leahy Scale: Good     Standing balance support: No upper extremity supported Standing balance-Leahy Scale: Fair                             ADL either performed or assessed with clinical judgement   ADL Overall ADL's : Needs assistance/impaired     Grooming: Standing;Set up                   Toilet Transfer: Min guard;RW;Comfort height toilet;Stand-pivot   Toileting- Water quality scientist and Hygiene: Min guard;Sit to/from stand         General ADL Comments: Pt agreed to OOB briefly.   Pt anxious to go home.  Pt verabalized and demonstrated understanding of hand exercises with theraputty     Vision Patient Visual Report: No change from baseline            Cognition Arousal/Alertness: Awake/alert Behavior During Therapy: WFL for tasks assessed/performed Overall Cognitive Status: Within Functional Limits for tasks assessed                                                     Pertinent Vitals/ Pain       Pain Assessment: No/denies pain         Frequency  Min 2X/week        Progress Toward Goals  OT Goals(current goals can now be found in the care plan section)  Progress towards OT goals: Progressing toward goals     Plan         AM-PAC PT "6 Clicks" Daily Activity     Outcome Measure   Help from another person eating meals?: None Help from another person taking care of personal grooming?: None Help from another person toileting, which includes using toliet, bedpan, or urinal?: A Little Help from another person bathing (including washing, rinsing, drying)?: A Little Help from another person to put on and taking off regular upper body clothing?: None Help from another person to put on and taking off regular lower body clothing?: A Little 6 Click Score: 21    End of Session    OT Visit Diagnosis: Unsteadiness on feet (R26.81);History of falling (Z91.81);Muscle weakness (generalized) (M62.81)   Activity Tolerance Patient tolerated treatment well   Patient Left in bed  Nurse Communication Mobility status        Time: 4739-5844 OT Time Calculation (min): 23 min  Charges: OT General Charges $OT Visit: 1 Visit OT Treatments $Self Care/Home Management : 8-22 mins  Melvin, Salvo   Betsy Pries 12/28/2017, 1:59 PM

## 2017-12-29 ENCOUNTER — Telehealth: Payer: Self-pay

## 2017-12-29 MED ORDER — FREESTYLE LIBRE 14 DAY READER DEVI: 1.0000 | 24 refills | Status: DC

## 2017-12-29 NOTE — Telephone Encounter (Signed)
Sent over

## 2017-12-30 ENCOUNTER — Telehealth: Payer: Self-pay

## 2017-12-30 NOTE — Telephone Encounter (Signed)
Pt returned call. Thanks TNP °

## 2017-12-30 NOTE — Telephone Encounter (Signed)
[name] with community [facility] called concerned about [name].  She states her blood sugars are out of control, seems to be confused easily.  She is concerned that she is not able to "Manage herself anymore".    [name] wanted to let you know this before her hospital follow up 01/05/2018  Thanks,   -Mickel Baas

## 2017-12-31 ENCOUNTER — Ambulatory Visit: Payer: Self-pay | Admitting: Physician Assistant

## 2018-01-04 NOTE — Telephone Encounter (Signed)
Tried to contact pt again after her CB and was unable to reach pt. LM to CB and pt did not CB. TCM not completed. FYI! -MM

## 2018-01-05 ENCOUNTER — Telehealth: Payer: Self-pay | Admitting: Physician Assistant

## 2018-01-05 ENCOUNTER — Inpatient Hospital Stay: Payer: Medicaid Other | Admitting: Physician Assistant

## 2018-01-05 NOTE — Telephone Encounter (Signed)
lmtcb

## 2018-01-05 NOTE — Telephone Encounter (Signed)
Patient has canceled hospital F/U x2. Not scheduled to come in until 01/10/18 now.

## 2018-01-05 NOTE — Telephone Encounter (Signed)
[facility] needs pt's last Office Notes with meds listed    Faxed to  [phone].    Thanks [employer]

## 2018-01-06 ENCOUNTER — Encounter: Payer: Self-pay | Admitting: Obstetrics and Gynecology

## 2018-01-06 ENCOUNTER — Other Ambulatory Visit: Payer: Self-pay | Admitting: Physician Assistant

## 2018-01-06 DIAGNOSIS — M1A39X0 Chronic gout due to renal impairment, multiple sites, without tophus (tophi): Secondary | ICD-10-CM

## 2018-01-06 DIAGNOSIS — G629 Polyneuropathy, unspecified: Secondary | ICD-10-CM

## 2018-01-06 DIAGNOSIS — D631 Anemia in chronic kidney disease: Secondary | ICD-10-CM

## 2018-01-06 DIAGNOSIS — A6000 Herpesviral infection of urogenital system, unspecified: Secondary | ICD-10-CM

## 2018-01-06 DIAGNOSIS — N183 Chronic kidney disease, stage 3 unspecified: Secondary | ICD-10-CM

## 2018-01-06 NOTE — Telephone Encounter (Signed)
[facility] called back and wanted to remind [place] to send office notes to faxed # below. They would like notes from pt's upcoming visit on 01/10/2018.

## 2018-01-07 ENCOUNTER — Telehealth: Payer: Self-pay

## 2018-01-07 NOTE — Telephone Encounter (Signed)
[name], patient care guide is calling to advise [name] that the patient was recently in the hospital and they noticed that the patient is taking the Hydralazine twice daily. She wanted to know if you wanted the patient to go back to three times daily? Please call her at [phone], and she will call the pharmacy and let them know as well. Thanks!

## 2018-01-10 ENCOUNTER — Ambulatory Visit: Payer: Medicaid Other | Admitting: Physician Assistant

## 2018-01-10 ENCOUNTER — Encounter: Payer: Self-pay | Admitting: Physician Assistant

## 2018-01-10 VITALS — BP 130/60 | HR 78 | Temp 98.2°F | Resp 20 | Ht 68.0 in | Wt 296.0 lb

## 2018-01-10 DIAGNOSIS — E118 Type 2 diabetes mellitus with unspecified complications: Secondary | ICD-10-CM

## 2018-01-10 DIAGNOSIS — I5032 Chronic diastolic (congestive) heart failure: Secondary | ICD-10-CM | POA: Diagnosis not present

## 2018-01-10 DIAGNOSIS — E1165 Type 2 diabetes mellitus with hyperglycemia: Secondary | ICD-10-CM

## 2018-01-10 DIAGNOSIS — N184 Chronic kidney disease, stage 4 (severe): Secondary | ICD-10-CM

## 2018-01-10 DIAGNOSIS — E1142 Type 2 diabetes mellitus with diabetic polyneuropathy: Secondary | ICD-10-CM

## 2018-01-10 DIAGNOSIS — L299 Pruritus, unspecified: Secondary | ICD-10-CM | POA: Diagnosis not present

## 2018-01-10 DIAGNOSIS — IMO0002 Reserved for concepts with insufficient information to code with codable children: Secondary | ICD-10-CM

## 2018-01-10 DIAGNOSIS — I10 Essential (primary) hypertension: Secondary | ICD-10-CM | POA: Diagnosis not present

## 2018-01-10 DIAGNOSIS — N179 Acute kidney failure, unspecified: Secondary | ICD-10-CM

## 2018-01-10 MED ORDER — TRIAMCINOLONE ACETONIDE 0.1 % EX CREA: 1.0000 "application " | TOPICAL_CREAM | Freq: Two times a day (BID) | CUTANEOUS | 0 refills | Status: DC

## 2018-01-10 MED ORDER — HYDRALAZINE HCL 100 MG PO TABS: 100.0000 mg | ORAL_TABLET | Freq: Three times a day (TID) | ORAL | 1 refills | Status: DC

## 2018-01-10 MED ORDER — HYDROXYZINE HCL 25 MG PO TABS: 25.0000 mg | ORAL_TABLET | Freq: Two times a day (BID) | ORAL | 1 refills | Status: DC | PRN

## 2018-01-10 MED ORDER — PREGABALIN 75 MG PO CAPS: 75.0000 mg | ORAL_CAPSULE | Freq: Two times a day (BID) | ORAL | 1 refills | Status: DC

## 2018-01-10 NOTE — Telephone Encounter (Signed)
Faxed today's office notes to number provided below.  Thanks,  -Joseline

## 2018-01-10 NOTE — Patient Instructions (Signed)
Lyrica will be changed to 75mg  twice daily Hydroxyzine 25mg  twice daily as needed for itching Hydralazine 100mg  three times daily for blood pressure

## 2018-01-10 NOTE — Telephone Encounter (Signed)
Anna advised as below.  

## 2018-01-10 NOTE — Telephone Encounter (Signed)
She is to be taking the hydralazine TID and hydroxyzine BID (drowsiness).

## 2018-01-10 NOTE — Addendum Note (Signed)
Addended by: [name] on: 01/10/2018 11:38 AM   Modules accepted: Level of Service

## 2018-01-10 NOTE — Progress Notes (Signed)
Patient: [name] Female    DOB: May 21, 1956   62 y.o.   MRN: [id number] Visit Date: 01/10/2018  Today's Provider: [name], PA-C   Chief Complaint  Patient presents with  . Follow-up   Subjective:    HPI  Follow up Hospitalization  Patient was admitted to [facility] on 12/24/17 and discharged on 12/28/17. She was treated for AKI on CKD and uncontrolled T2DM. Treatment for this included labs, fluids and IV insulin.  Telephone follow up was done on 12/29/17 She reports excellent compliance with treatment. She reports this condition is Improved. Patient reports FBS was 267 this morning. Patient denies any shortness of breath or cough. Patient denies any swelling around feet or ankles.  ------------------------------------------------------------------------------------    Allergies  Allergen Reactions  . Shellfish Allergy Anaphylaxis and Swelling  . Diazepam Other (See Comments)    "felt like out of body experience"  . Morphine And Related Itching     Current Outpatient Medications:  .  albuterol (PROVENTIL) (2.5 MG/3ML) 0.083% nebulizer solution, Take 3 mLs (2.5 mg total) by nebulization every 6 (six) hours as needed for wheezing or shortness of breath., Disp: 150 mL, Rfl: 1 .  albuterol (VENTOLIN HFA) 108 (90 Base) MCG/ACT inhaler, Inhale 2 puffs into the lungs every 4 (four) hours as needed for wheezing or shortness of breath., Disp: 54 g, Rfl: 3 .  allopurinol (ZYLOPRIM) 100 MG tablet, TAKE (2) TABLETS BY MOUTH ONCE DAILY., Disp: 180 tablet, Rfl: 1 .  aspirin EC 81 MG EC tablet, Take 1 tablet (81 mg total) by mouth daily., Disp: , Rfl:  .  atorvastatin (LIPITOR) 80 MG tablet, Take 1 tablet (80 mg total) by mouth daily at 6 PM. (Patient taking differently: Take 80 mg by mouth every morning. ), Disp: 90 tablet, Rfl: 3 .  Blood Glucose Monitoring Suppl (ACCU-CHEK AVIVA) device, Use as instructed daily., Disp: 1 each, Rfl: 0 .  Blood Pressure Monitoring  (ADULT BLOOD PRESSURE CUFF LG) KIT, Check blood pressure once daily, Disp: 1 each, Rfl: 0 .  carvedilol (COREG) 25 MG tablet, TAKE 1 TABLET BY MOUTH 2 TIMES DAILY WITH A MEAL. (Patient taking differently: Take 25 mg by mouth 2 (two) times daily with a meal. ), Disp: 180 tablet, Rfl: 3 .  Continuous Blood Gluc Receiver (FREESTYLE LIBRE 14 DAY READER) DEVI, 1 each by Does not apply route every 14 (fourteen) days., Disp: 1 Device, Rfl: 24 .  docusate sodium (COLACE) 100 MG capsule, Take 1 capsule (100 mg total) by mouth 2 (two) times daily as needed for mild constipation., Disp: 30 capsule, Rfl: 0 .  Dulaglutide (TRULICITY) 3.53 GD/9.2EQ SOPN, Inject 0.75 mg into the skin once a week. (Patient taking differently: Inject 0.75 mg into the skin once a week. Thurs), Disp: 4 pen, Rfl: 3 .  FEROSUL 325 (65 Fe) MG tablet, TAKE (1) TABLET BY MOUTH EVERY OTHER DAY., Disp: 45 tablet, Rfl: 1 .  fluticasone (FLONASE) 50 MCG/ACT nasal spray, Place 2 sprays into both nostrils daily as needed for allergies or rhinitis., Disp: 48 g, Rfl: 1 .  Fluticasone-Salmeterol (ADVAIR DISKUS) 250-50 MCG/DOSE AEPB, Inhale 1 puff into the lungs 2 (two) times daily., Disp: 180 each, Rfl: 3 .  glucose blood (ACCU-CHEK AVIVA) test strip, Use as instructed daily, Disp: 100 each, Rfl: 12 .  glucose blood (TRUE METRIX BLOOD GLUCOSE TEST) test strip, Use as instructed, Disp: 100 each, Rfl: 12 .  hydrALAZINE (APRESOLINE) 100 MG  tablet, Take 100 mg by mouth 3 (three) times daily. , Disp: , Rfl: 1 .  hydrOXYzine (ATARAX/VISTARIL) 50 MG tablet, Take 1 tablet (50 mg total) by mouth 2 (two) times daily as needed. (Patient taking differently: Take 50 mg by mouth 3 (three) times daily. ), Disp: 60 tablet, Rfl: 0 .  insulin aspart (NOVOLOG) 100 UNIT/ML FlexPen, Inject 10 Units into the skin 3 (three) times daily with meals. (Patient taking differently: Inject 25 Units into the skin 2 (two) times daily. ), Disp: 15 mL, Rfl: 3 .  Insulin Glargine  (LANTUS SOLOSTAR) 100 UNIT/ML Solostar Pen, Inject 60 Units into the skin 2 (two) times daily., Disp: 24 mL, Rfl: 0 .  Insulin Pen Needle (PEN NEEDLES) 30G X 8 MM MISC, 1 each by Does not apply route 3 (three) times daily before meals. AC for novolog injections, Disp: 100 each, Rfl: 11 .  isosorbide mononitrate (IMDUR) 30 MG 24 hr tablet, TAKE 3 TABLETS(90 MG) BY MOUTH DAILY, Disp: 270 tablet, Rfl: 1 .  Lancet Devices (ACCU-CHEK SOFTCLIX) lancets, Use as instructed daily., Disp: 1 each, Rfl: 5 .  loratadine (CLARITIN) 10 MG tablet, Take 1 tablet (10 mg total) by mouth daily., Disp: 90 tablet, Rfl: 1 .  metolazone (ZAROXOLYN) 2.5 MG tablet, Take 1 tablet (2.5 mg total) by mouth daily as needed (swelling)., Disp: 90 tablet, Rfl: 1 .  montelukast (SINGULAIR) 10 MG tablet, Take 1 tablet (10 mg total) by mouth at bedtime., Disp: 90 tablet, Rfl: 1 .  nitroGLYCERIN (NITROSTAT) 0.4 MG SL tablet, Place 1 tablet (0.4 mg total) under the tongue every 5 (five) minutes x 3 doses as needed for chest pain., Disp: 25 tablet, Rfl: 12 .  omeprazole (PRILOSEC) 20 MG capsule, Take 1 capsule (20 mg total) by mouth daily., Disp: 90 capsule, Rfl: 3 .  potassium chloride SA (K-DUR,KLOR-CON) 20 MEQ tablet, Take 1 tablet (20 mEq total) by mouth 2 (two) times daily., Disp: 60 tablet, Rfl: 5 .  pregabalin (LYRICA) 150 MG capsule, Take 1 capsule (150 mg total) by mouth at bedtime., Disp: 30 capsule, Rfl: 0 .  ticagrelor (BRILINTA) 90 MG TABS tablet, Take 1 tablet (90 mg total) by mouth 2 (two) times daily., Disp: 180 tablet, Rfl: 3 .  torsemide (DEMADEX) 20 MG tablet, TAKE (2) TABLETS BY MOUTH TWICE DAILY., Disp: 120 tablet, Rfl: 5 .  valACYclovir (VALTREX) 500 MG tablet, TAKE (1) TABLET BY MOUTH EVERY OTHER DAY., Disp: 45 tablet, Rfl: 1 .  Vitamin D, Ergocalciferol, (DRISDOL) 50000 units CAPS capsule, TAKE 1 CAPSULE BY MOUTH ONCE A MONTH, Disp: 3 capsule, Rfl: 3 No current facility-administered medications for this visit.    Facility-Administered Medications Ordered in Other Visits:  .  0.9 %  sodium chloride infusion, , Intravenous, Continuous, Vanga, Tally Due, MD .  0.9 %  sodium chloride infusion, , Intravenous, Continuous, Piscitello, Precious Haws, MD .  moxifloxacin (VIGAMOX) 0.5 % ophthalmic solution 1 drop, 1 drop, Right Eye, PRN, Birder Robson, MD  Review of Systems  Constitutional: Negative.   Eyes: Positive for visual disturbance.  Respiratory: Negative.   Cardiovascular: Negative.   Neurological: Negative.     Social History   Tobacco Use  . Smoking status: Former Smoker    Last attempt to quit: 10/25/1980    Years since quitting: 37.2  . Smokeless tobacco: Never Used  . Tobacco comment: quit smoking in 1982  Substance Use Topics  . Alcohol use: Yes    Comment: 3 times in last  year   Objective:   BP 130/60 (BP Location: Left Arm, Patient Position: Sitting, Cuff Size: Normal)   Pulse 78   Temp 98.2 F (36.8 C) (Oral)   Resp 20   Ht '5\' 8"'$  (1.727 m)   Wt 296 lb (134.3 kg)   SpO2 97%   BMI 45.01 kg/m  Vitals:   01/10/18 1052  BP: 130/60  Pulse: 78  Resp: 20  Temp: 98.2 F (36.8 C)  TempSrc: Oral  SpO2: 97%  Weight: 296 lb (134.3 kg)  Height: '5\' 8"'$  (1.727 m)     Physical Exam  Constitutional: She appears well-developed and well-nourished. No distress.  Neck: Normal range of motion. Neck supple. No JVD present. No tracheal deviation present. No thyromegaly present.  Cardiovascular: Normal rate and regular rhythm. Exam reveals no gallop and no friction rub.  Murmur heard. Pulmonary/Chest: Effort normal. No respiratory distress. She has wheezes (mild expiratory wheezes heard mid lung fields bilaterally.). She has no rales.  Musculoskeletal: She exhibits edema (trace today).  Lymphadenopathy:    She has no cervical adenopathy.  Skin: She is not diaphoretic.  Vitals reviewed.       Assessment & Plan:     1. Essential hypertension Medication updated with current  dosing and refills sent to [facility] for her. BP today doing well. Continue hydralazine '100mg'$  TID, Imdur '30mg'$  daily, carvedilol '25mg'$  BID.  - hydrALAZINE (APRESOLINE) 100 MG tablet; Take 1 tablet (100 mg total) by mouth 3 (three) times daily.  Dispense: 270 tablet; Refill: 1  2. AKI (acute kidney injury) (Start) Improved. Meds reviewed today and renally dosed. Will check labs as below and f/u pending results. She will see her nephrologist, Dr. Abigail Butts today following this appointment. I will send results of blood work to him once received.  - CBC w/Diff/Platelet - HgB A1c - Renal Function Panel  3. CKD (chronic kidney disease) stage 4, GFR 15-29 ml/min (HCC) See above medical treatment plan. - CBC w/Diff/Platelet - HgB A1c - Renal Function Panel  4. Chronic diastolic heart failure (HCC) Continue BP meds as noted above. Continue torsemide '20mg'$  BID. Use metalozone 2.'5mg'$  prn for weight gain of over 3 pounds in a day or 5 pounds in a week.  - CBC w/Diff/Platelet - HgB A1c  5. Diabetes mellitus type 2, uncontrolled, with complications (HCC) Fasting sugars are improving per patient. Continue Lantus 60 units BID and Novolog 10 units TID (patient reports taking '25mg'$  BID instead). Will check labs as below and f/u pending results. - CBC w/Diff/Platelet - HgB A1c  6. Itching Renally dosed hydroxyzine and decreased to '25mg'$  BID instead of '50mg'$ . Triamcinolone sent in for acute "hot spots". - hydrOXYzine (ATARAX/VISTARIL) 25 MG tablet; Take 1 tablet (25 mg total) by mouth 2 (two) times daily as needed.  Dispense: 180 tablet; Refill: 1 - triamcinolone cream (KENALOG) 0.1 %; Apply 1 application topically 2 (two) times daily.  Dispense: 30 g; Refill: 0  7. Diabetic polyneuropathy associated with type 2 diabetes mellitus (HCC) Lyrica changed to '75mg'$  BID for renal dosing instead of '150mg'$  once daily.  - pregabalin (LYRICA) 75 MG capsule; Take 1 capsule (75 mg total) by mouth 2 (two) times daily.   Dispense: 180 capsule; Refill: [place], PA-C  [facility]

## 2018-01-11 LAB — CBC WITH DIFFERENTIAL/PLATELET
Basophils Absolute: 0 10*3/uL (ref 0.0–0.2)
Basos: 0 %
EOS (ABSOLUTE): 0.5 10*3/uL — ABNORMAL HIGH (ref 0.0–0.4)
Eos: 4 %
Hematocrit: 28.2 % — ABNORMAL LOW (ref 34.0–46.6)
Hemoglobin: 8.9 g/dL — ABNORMAL LOW (ref 11.1–15.9)
Immature Grans (Abs): 0.4 10*3/uL — ABNORMAL HIGH (ref 0.0–0.1)
Immature Granulocytes: 4 %
Lymphocytes Absolute: 1.9 10*3/uL (ref 0.7–3.1)
Lymphs: 17 %
MCH: 25.6 pg — ABNORMAL LOW (ref 26.6–33.0)
MCHC: 31.6 g/dL (ref 31.5–35.7)
MCV: 81 fL (ref 79–97)
Monocytes Absolute: 0.8 10*3/uL (ref 0.1–0.9)
Monocytes: 7 %
Neutrophils Absolute: 7.9 10*3/uL — ABNORMAL HIGH (ref 1.4–7.0)
Neutrophils: 68 %
Platelets: 285 10*3/uL (ref 150–450)
RBC: 3.48 x10E6/uL — ABNORMAL LOW (ref 3.77–5.28)
RDW: 19.6 % — ABNORMAL HIGH (ref 12.3–15.4)
WBC: 11.1 10*3/uL — ABNORMAL HIGH (ref 3.4–10.8)

## 2018-01-11 LAB — RENAL FUNCTION PANEL
Albumin: 4.2 g/dL (ref 3.5–5.5)
BUN/Creatinine Ratio: 36 — ABNORMAL HIGH (ref 9–23)
BUN: 108 mg/dL (ref 6–24)
CO2: 20 mmol/L (ref 20–29)
Calcium: 8.9 mg/dL (ref 8.7–10.2)
Chloride: 98 mmol/L (ref 96–106)
Creatinine, Ser: 3.01 mg/dL — ABNORMAL HIGH (ref 0.57–1.00)
GFR calc Af Amer: 19 mL/min/{1.73_m2} — ABNORMAL LOW (ref >59)
GFR calc non Af Amer: 16 mL/min/{1.73_m2} — ABNORMAL LOW (ref >59)
Glucose: 278 mg/dL — ABNORMAL HIGH (ref 65–99)
Phosphorus: 4.9 mg/dL — ABNORMAL HIGH (ref 2.5–4.5)
Potassium: 4.1 mmol/L (ref 3.5–5.2)
Sodium: 139 mmol/L (ref 134–144)

## 2018-01-11 LAB — HEMOGLOBIN A1C
Est. average glucose Bld gHb Est-mCnc: 269 mg/dL
Hgb A1c MFr Bld: 11 % — ABNORMAL HIGH (ref 4.8–5.6)

## 2018-01-12 ENCOUNTER — Telehealth: Payer: Self-pay

## 2018-01-12 NOTE — Telephone Encounter (Signed)
-----   Message from [name], PA-C sent at 01/12/2018  9:17 AM EDT ----- Hemoglobin stable at 8.9. Hemoglobin A1c for diabetes at 11.0, down from 11.5. Creatinine is back up to 3.01 and phosphorus up to 4.9. Discussed with Dr. Abigail Butts and we recommend to return to see me in 2 weeks for labs. We will try to call to schedule, but if you dont hear from [place], please call to schedule.

## 2018-01-12 NOTE — Telephone Encounter (Signed)
Patient advised as below. Patient verbalizes understanding and is in agreement with treatment plan.  

## 2018-01-13 IMAGING — CT CT HEAD W/O CM
3 of 4 series · 15 of 47 positions shown, 18 images · non-contrast
Comparison: 06/18/2014

CLINICAL DATA: Left-sided headaches. Persistent dizziness, onset 12
days ago .

EXAM:
CT HEAD WITHOUT CONTRAST
CT MAXILLOFACIAL WITHOUT CONTRAST
TECHNIQUE: Multidetector CT imaging of the head and maxillofacial structures
were performed using the standard protocol without intravenous
contrast. Multiplanar CT image reconstructions of the maxillofacial
structures were also generated.

[Series 5: facial/ orbits 2.0 h30s · axial · 0.36mm/px · z∈[-182,-44]mm · 9 of 87 slices shown, 12 images]
[im 9/87  brain]
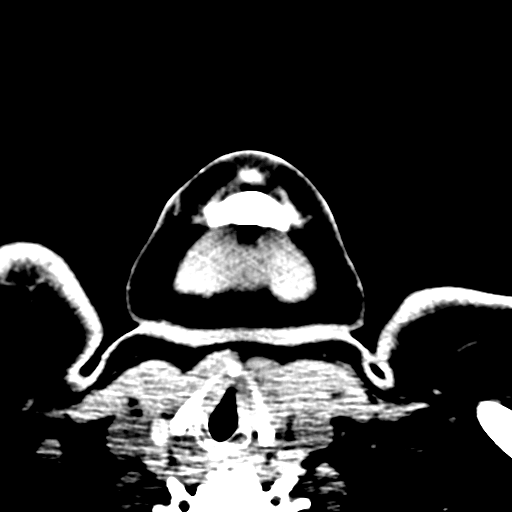
[im 9/87  bone]
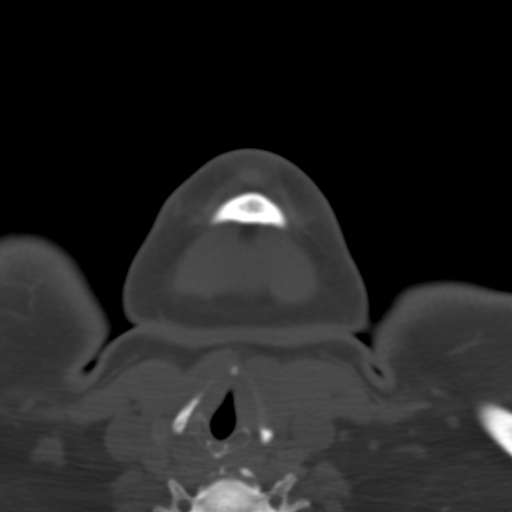
[im 17/87  brain]
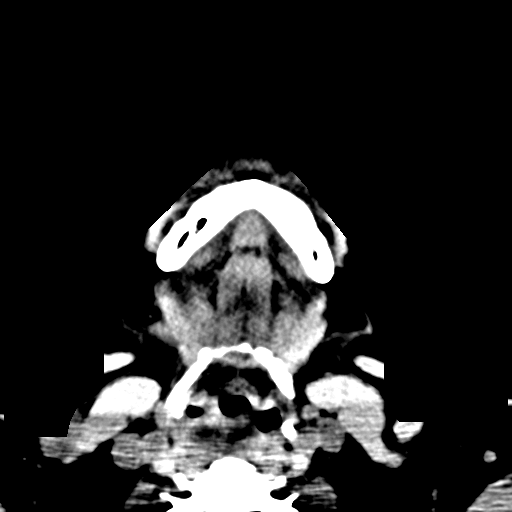
[im 25/87  brain]
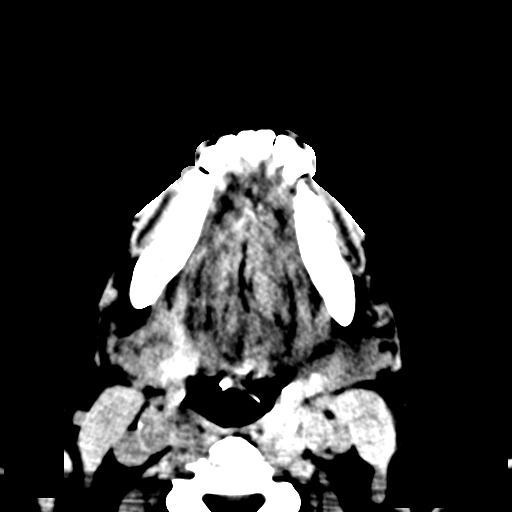
[im 33/87  brain]
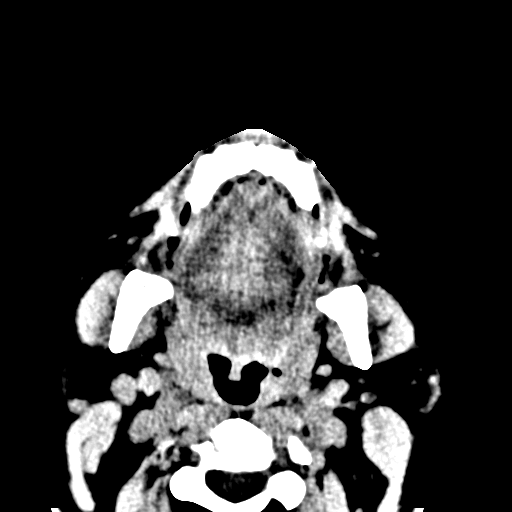
[im 46/87  brain]
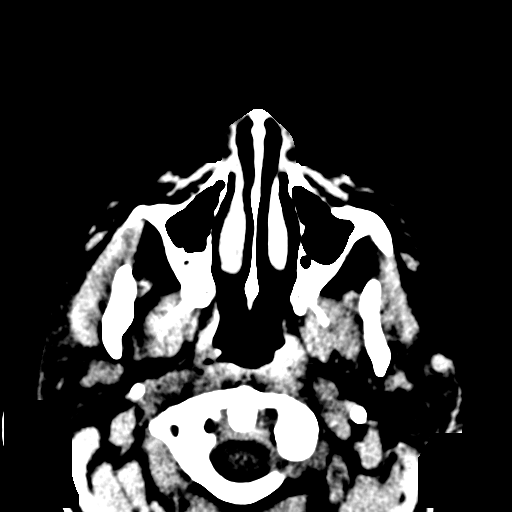
[im 46/87  bone]
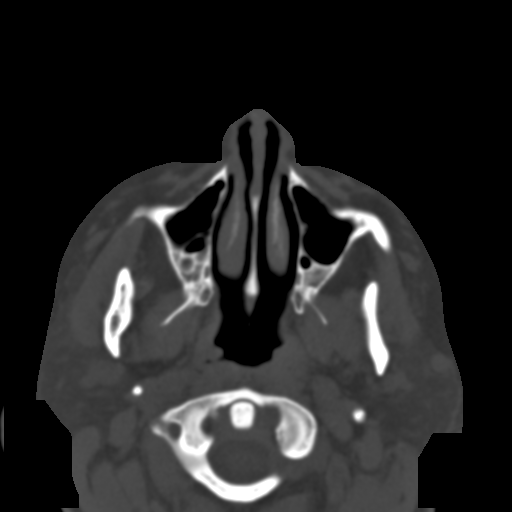
[im 54/87  brain]
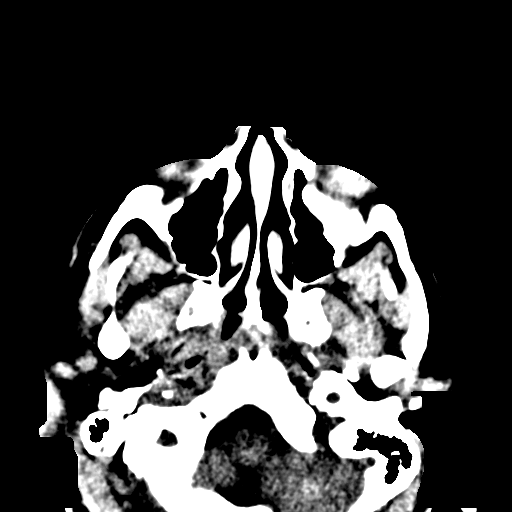
[im 62/87  brain]
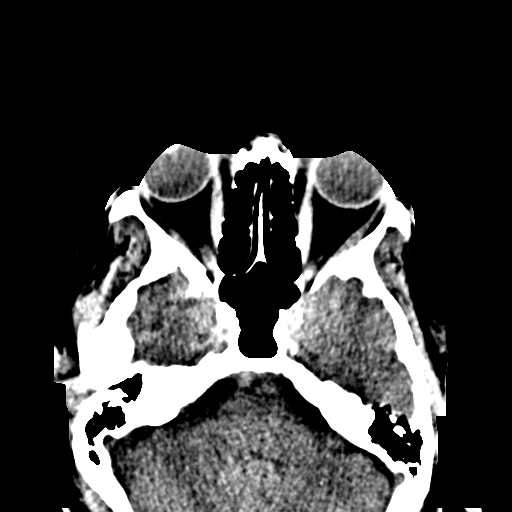
[im 70/87  brain]
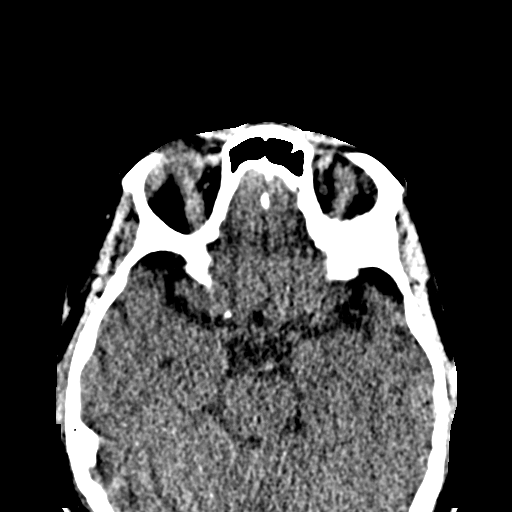
[im 78/87  brain]
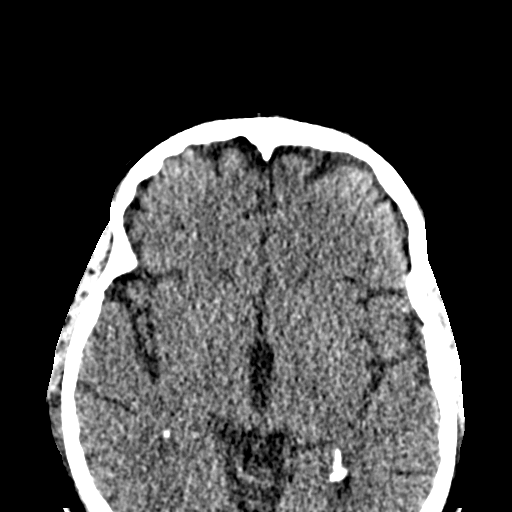
[im 78/87  bone]
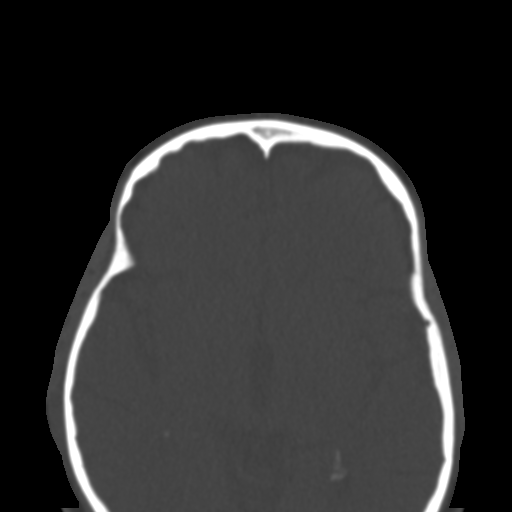

[Series 9: coronal soft tissue · coronal · 0.35mm/px · 3 of 73 slices shown]
[im 25/73  brain]
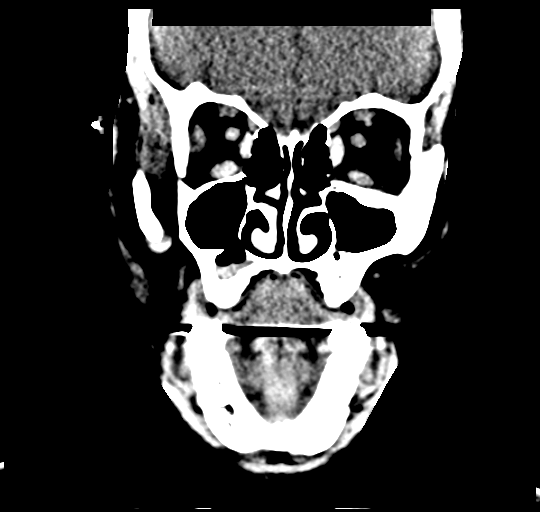
[im 33/73  brain]
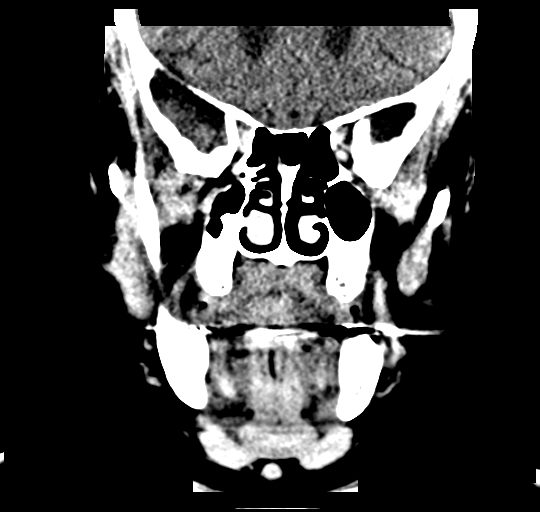
[im 41/73  brain]
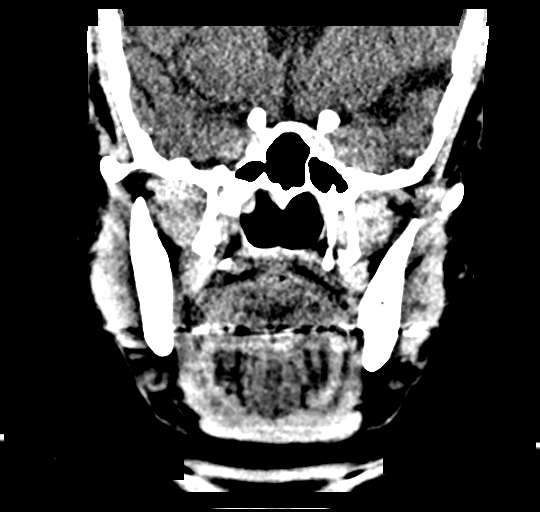

[Series 10: sagittal soft tissue · sagittal · 0.30mm/px · 3 of 81 slices shown]
[im 27/81  brain]
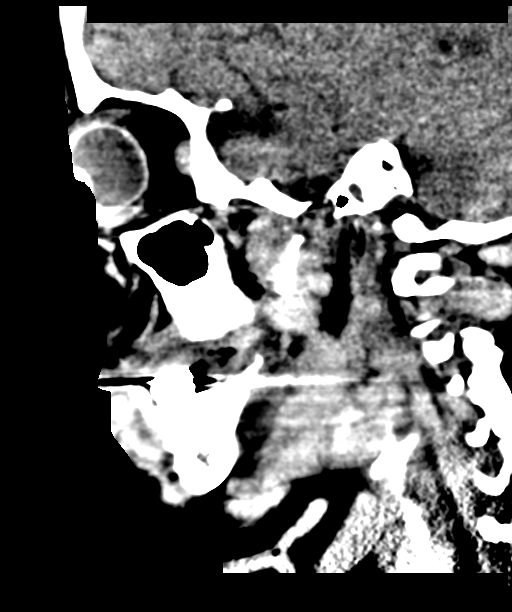
[im 41/81  brain]
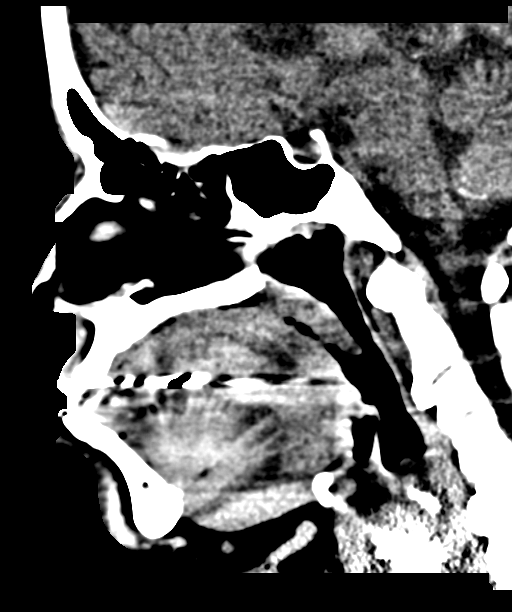
[im 54/81  brain]
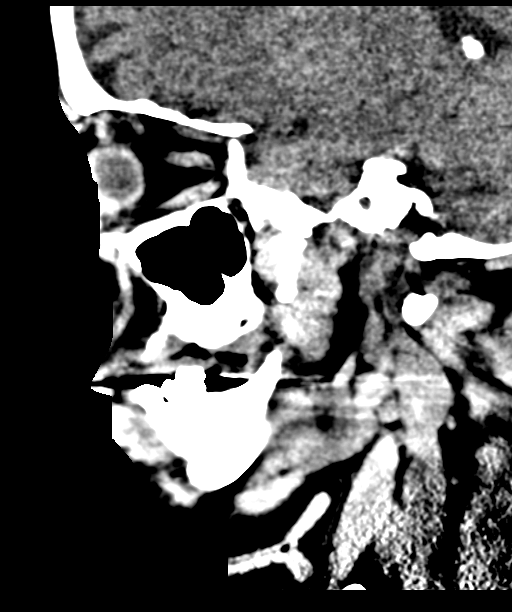

[15 of 47 positions shown; findings below may reference images not displayed]

FINDINGS: CT HEAD FINDINGS

The brainstem, cerebellum, cerebral peduncles, thalami, basal
ganglia, basilar cisterns, and ventricular system appear within
normal limits. No intracranial hemorrhage, mass lesion, or acute
CVA. There is atherosclerotic calcification of the cavernous carotid
arteries bilaterally.

CT MAXILLOFACIAL FINDINGS

Chronic right maxillary sinusitis.

Platelike appearance along the left inferior orbital wall, query
prior prosthesis along the inferior orbital wall. Small medial
orbital wall fracture inferiorly, chronic and stable.

Mild mucosal thickening in the right nasal cavity.

No intraconal abnormality.  The globes appear symmetric.

No acute facial fracture.
IMPRESSION: 1. Mild chronic right maxillary sinusitis.
2. No acute intracranial findings.
3. Stable appearance of thickening and possible prosthesis along the
left inferior orbit likely from an old fracture.
4. Atherosclerosis.

## 2018-01-19 ENCOUNTER — Other Ambulatory Visit: Payer: Self-pay

## 2018-01-21 ENCOUNTER — Telehealth: Payer: Self-pay | Admitting: Physician Assistant

## 2018-01-21 ENCOUNTER — Other Ambulatory Visit: Payer: Self-pay | Admitting: Physician Assistant

## 2018-01-21 DIAGNOSIS — L299 Pruritus, unspecified: Secondary | ICD-10-CM

## 2018-01-21 MED ORDER — TRIAMCINOLONE ACETONIDE 0.1 % EX CREA: 1.0000 "application " | TOPICAL_CREAM | Freq: Two times a day (BID) | CUTANEOUS | 0 refills | Status: DC

## 2018-01-21 NOTE — Telephone Encounter (Signed)
I haven't see any fax.

## 2018-01-21 NOTE — Telephone Encounter (Signed)
Pt called saying her GI dr sent a fax asking that she go off the blood thinner so she can have the colonoscopy.  Pt is following up to see if we have received the request.  She is waiting to schedule the colonoscopy  Thanks teri

## 2018-01-21 NOTE — Telephone Encounter (Signed)
Pt states since the  Hydroxyzine dose has been lowered she has been itching a lot more.  She was prescribed a Kenalog cream which help but she is almost out .  She needs a refill on the generic Kenalog cream  She uses [facility] in [place]  CB# is [phone]  Thanks [employer]

## 2018-01-21 NOTE — Telephone Encounter (Signed)
Hold for 5 days

## 2018-01-24 ENCOUNTER — Telehealth: Payer: Self-pay

## 2018-01-24 NOTE — Telephone Encounter (Signed)
Patient contacted office stated that Dr. Alphonzo Cruise office informed her to stop blood thinner 5 days before colonoscopy.  Since her procedure was scheduled for 07/18 we've rescheduled to 02/01/18 to allow 5 days off blood thinner.  Contacted Penny in Endo to reschedule.  Blood Thinner hold is documented in chart message 01/21/18 from Dr. Marlyn Corporal.  Thanks [employer]

## 2018-01-24 NOTE — Telephone Encounter (Signed)
Patient advised as directed below. Advised patient that [name] also message the GI clinic. Per patient she is also going to call to let them know.  Thanks,  -Deaja Rizo

## 2018-01-26 ENCOUNTER — Ambulatory Visit: Payer: Self-pay | Admitting: Physician Assistant

## 2018-01-27 ENCOUNTER — Ambulatory Visit (INDEPENDENT_AMBULATORY_CARE_PROVIDER_SITE_OTHER): Payer: Medicaid Other | Admitting: Obstetrics and Gynecology

## 2018-01-27 ENCOUNTER — Ambulatory Visit (INDEPENDENT_AMBULATORY_CARE_PROVIDER_SITE_OTHER): Payer: Medicaid Other | Admitting: Physician Assistant

## 2018-01-27 ENCOUNTER — Encounter: Payer: Self-pay | Admitting: Obstetrics and Gynecology

## 2018-01-27 ENCOUNTER — Telehealth: Payer: Self-pay

## 2018-01-27 ENCOUNTER — Other Ambulatory Visit (HOSPITAL_COMMUNITY)
Admission: RE | Admit: 2018-01-27 | Discharge: 2018-01-27 | Disposition: A | Discharge status: Home / Self Care | Payer: Medicaid Other | Source: Ambulatory Visit | Attending: Obstetrics and Gynecology | Admitting: Obstetrics and Gynecology

## 2018-01-27 ENCOUNTER — Encounter: Payer: Self-pay | Admitting: Physician Assistant

## 2018-01-27 VITALS — BP 130/70 | HR 92 | Temp 99.4°F | Resp 19 | Wt 307.6 lb

## 2018-01-27 VITALS — BP 138/64 | HR 96 | Ht 68.0 in | Wt 298.0 lb

## 2018-01-27 DIAGNOSIS — E118 Type 2 diabetes mellitus with unspecified complications: Secondary | ICD-10-CM

## 2018-01-27 DIAGNOSIS — E1165 Type 2 diabetes mellitus with hyperglycemia: Secondary | ICD-10-CM

## 2018-01-27 DIAGNOSIS — I5032 Chronic diastolic (congestive) heart failure: Secondary | ICD-10-CM | POA: Diagnosis not present

## 2018-01-27 DIAGNOSIS — N870 Mild cervical dysplasia: Secondary | ICD-10-CM | POA: Diagnosis not present

## 2018-01-27 DIAGNOSIS — N184 Chronic kidney disease, stage 4 (severe): Secondary | ICD-10-CM

## 2018-01-27 DIAGNOSIS — IMO0002 Reserved for concepts with insufficient information to code with codable children: Secondary | ICD-10-CM

## 2018-01-27 DIAGNOSIS — B351 Tinea unguium: Secondary | ICD-10-CM

## 2018-01-27 DIAGNOSIS — R87612 Low grade squamous intraepithelial lesion on cytologic smear of cervix (LGSIL): Secondary | ICD-10-CM | POA: Diagnosis not present

## 2018-01-27 MED ORDER — TRAMADOL HCL 50 MG PO TABS: 50.0000 mg | ORAL_TABLET | Freq: Three times a day (TID) | ORAL | 0 refills | Status: DC | PRN

## 2018-01-27 MED ORDER — METOLAZONE 2.5 MG PO TABS: 2.5000 mg | ORAL_TABLET | Freq: Every day | ORAL | 1 refills | Status: DC | PRN

## 2018-01-27 NOTE — Progress Notes (Signed)
Patient: [name] Female    DOB: [birth year]   62 y.o.   MRN: [id number] Visit Date: 01/27/2018  Today's Provider: [name], PA-C   Chief Complaint  Patient presents with  . Follow-up    Labs   Subjective:    HPI Patient here today to recheck labs. Patients creatinine is back up to 3.01 and phosphorus up to 4.9. She is followed by Dr. Juleen China.   Lab Results  Component Value Date   WBC 11.1 (H) 01/10/2018   HGB 8.9 (L) 01/10/2018   HCT 28.2 (L) 01/10/2018   PLT 285 01/10/2018   GLUCOSE 278 (H) 01/10/2018   CHOL 122 09/13/2016   TRIG 168 (H) 09/13/2016   HDL 36 (L) 09/13/2016   LDLCALC 52 09/13/2016   ALT 29 12/25/2017   AST 26 12/25/2017   NA 139 01/10/2018   K 4.1 01/10/2018   CL 98 01/10/2018   CREATININE 3.01 (H) 01/10/2018   BUN 108 (HH) 01/10/2018   CO2 20 01/10/2018   TSH 1.577 12/25/2017   INR 1.02 12/14/2017   HGBA1C 11.0 (H) 01/10/2018   MICROALBUR 237.2 (H) 11/08/2014   Lab Results  Component Value Date   PTH 232 (H) 08/16/2017   CALCIUM 8.9 01/10/2018   CAION 1.00 (L) 09/14/2015   PHOS 4.9 (H) 01/10/2018    Patient also c/o right toe nail coming off. She reports she hit it and the nail raised off the nail bed. She is having a lot of pain. No active bleeding. Patient is diabetic with nephropathy and neuropathy.     Allergies  Allergen Reactions  . Shellfish Allergy Anaphylaxis and Swelling  . Diazepam Other (See Comments)    "felt like out of body experience"  . Morphine And Related Itching     Current Outpatient Medications:  .  albuterol (PROVENTIL) (2.5 MG/3ML) 0.083% nebulizer solution, Take 3 mLs (2.5 mg total) by nebulization every 6 (six) hours as needed for wheezing or shortness of breath., Disp: 150 mL, Rfl: 1 .  albuterol (VENTOLIN HFA) 108 (90 Base) MCG/ACT inhaler, Inhale 2 puffs into the lungs every 4 (four) hours as needed for wheezing or shortness of breath., Disp: 54 g, Rfl: 3 .  allopurinol (ZYLOPRIM) 100  MG tablet, TAKE (2) TABLETS BY MOUTH ONCE DAILY., Disp: 180 tablet, Rfl: 1 .  aspirin EC 81 MG EC tablet, Take 1 tablet (81 mg total) by mouth daily., Disp: , Rfl:  .  atorvastatin (LIPITOR) 80 MG tablet, Take 1 tablet (80 mg total) by mouth daily at 6 PM. (Patient taking differently: Take 80 mg by mouth every morning. ), Disp: 90 tablet, Rfl: 3 .  Blood Glucose Monitoring Suppl (ACCU-CHEK AVIVA) device, Use as instructed daily., Disp: 1 each, Rfl: 0 .  Blood Pressure Monitoring (ADULT BLOOD PRESSURE CUFF LG) KIT, Check blood pressure once daily, Disp: 1 each, Rfl: 0 .  carvedilol (COREG) 25 MG tablet, TAKE 1 TABLET BY MOUTH 2 TIMES DAILY WITH A MEAL. (Patient taking differently: Take 25 mg by mouth 2 (two) times daily with a meal. ), Disp: 180 tablet, Rfl: 3 .  Continuous Blood Gluc Receiver (FREESTYLE LIBRE 14 DAY READER) DEVI, 1 each by Does not apply route every 14 (fourteen) days., Disp: 1 Device, Rfl: 24 .  docusate sodium (COLACE) 100 MG capsule, Take 1 capsule (100 mg total) by mouth 2 (two) times daily as needed for mild constipation., Disp: 30 capsule, Rfl: 0 .  Dulaglutide (  TRULICITY) 8.36 OQ/9.4TM SOPN, Inject 0.75 mg into the skin once a week. (Patient taking differently: Inject 0.75 mg into the skin once a week. Thurs), Disp: 4 pen, Rfl: 3 .  FEROSUL 325 (65 Fe) MG tablet, TAKE (1) TABLET BY MOUTH EVERY OTHER DAY., Disp: 45 tablet, Rfl: 1 .  fluticasone (FLONASE) 50 MCG/ACT nasal spray, Place 2 sprays into both nostrils daily as needed for allergies or rhinitis., Disp: 48 g, Rfl: 1 .  Fluticasone-Salmeterol (ADVAIR DISKUS) 250-50 MCG/DOSE AEPB, Inhale 1 puff into the lungs 2 (two) times daily., Disp: 180 each, Rfl: 3 .  glucose blood (ACCU-CHEK AVIVA) test strip, Use as instructed daily, Disp: 100 each, Rfl: 12 .  glucose blood (TRUE METRIX BLOOD GLUCOSE TEST) test strip, Use as instructed, Disp: 100 each, Rfl: 12 .  hydrALAZINE (APRESOLINE) 100 MG tablet, Take 1 tablet (100 mg total) by  mouth 3 (three) times daily., Disp: 270 tablet, Rfl: 1 .  hydrOXYzine (ATARAX/VISTARIL) 25 MG tablet, Take 1 tablet (25 mg total) by mouth 2 (two) times daily as needed., Disp: 180 tablet, Rfl: 1 .  insulin aspart (NOVOLOG) 100 UNIT/ML FlexPen, Inject 10 Units into the skin 3 (three) times daily with meals. (Patient taking differently: Inject 25 Units into the skin 2 (two) times daily. ), Disp: 15 mL, Rfl: 3 .  Insulin Glargine (LANTUS SOLOSTAR) 100 UNIT/ML Solostar Pen, Inject 60 Units into the skin 2 (two) times daily., Disp: 24 mL, Rfl: 0 .  Insulin Pen Needle (PEN NEEDLES) 30G X 8 MM MISC, 1 each by Does not apply route 3 (three) times daily before meals. AC for novolog injections, Disp: 100 each, Rfl: 11 .  isosorbide mononitrate (IMDUR) 30 MG 24 hr tablet, TAKE 3 TABLETS(90 MG) BY MOUTH DAILY, Disp: 270 tablet, Rfl: 1 .  Lancet Devices (ACCU-CHEK SOFTCLIX) lancets, Use as instructed daily., Disp: 1 each, Rfl: 5 .  loratadine (CLARITIN) 10 MG tablet, Take 1 tablet (10 mg total) by mouth daily., Disp: 90 tablet, Rfl: 1 .  metolazone (ZAROXOLYN) 2.5 MG tablet, Take 1 tablet (2.5 mg total) by mouth daily as needed (swelling)., Disp: 90 tablet, Rfl: 1 .  montelukast (SINGULAIR) 10 MG tablet, Take 1 tablet (10 mg total) by mouth at bedtime., Disp: 90 tablet, Rfl: 1 .  nitroGLYCERIN (NITROSTAT) 0.4 MG SL tablet, Place 1 tablet (0.4 mg total) under the tongue every 5 (five) minutes x 3 doses as needed for chest pain., Disp: 25 tablet, Rfl: 12 .  omeprazole (PRILOSEC) 20 MG capsule, Take 1 capsule (20 mg total) by mouth daily., Disp: 90 capsule, Rfl: 3 .  potassium chloride SA (K-DUR,KLOR-CON) 20 MEQ tablet, Take 1 tablet (20 mEq total) by mouth 2 (two) times daily., Disp: 60 tablet, Rfl: 5 .  pregabalin (LYRICA) 75 MG capsule, Take 1 capsule (75 mg total) by mouth 2 (two) times daily., Disp: 180 capsule, Rfl: 1 .  ticagrelor (BRILINTA) 90 MG TABS tablet, Take 1 tablet (90 mg total) by mouth 2 (two) times  daily., Disp: 180 tablet, Rfl: 3 .  torsemide (DEMADEX) 20 MG tablet, TAKE (2) TABLETS BY MOUTH TWICE DAILY., Disp: 120 tablet, Rfl: 5 .  triamcinolone cream (KENALOG) 0.1 %, Apply 1 application topically 2 (two) times daily., Disp: 80 g, Rfl: 0 .  valACYclovir (VALTREX) 500 MG tablet, TAKE (1) TABLET BY MOUTH EVERY OTHER DAY., Disp: 45 tablet, Rfl: 1 .  Vitamin D, Ergocalciferol, (DRISDOL) 50000 units CAPS capsule, TAKE 1 CAPSULE BY MOUTH ONCE A MONTH, Disp:  3 capsule, Rfl: 3 No current facility-administered medications for this visit.   Facility-Administered Medications Ordered in Other Visits:  .  0.9 %  sodium chloride infusion, , Intravenous, Continuous, Vanga, Tally Due, MD .  0.9 %  sodium chloride infusion, , Intravenous, Continuous, Piscitello, Precious Haws, MD .  moxifloxacin (VIGAMOX) 0.5 % ophthalmic solution 1 drop, 1 drop, Right Eye, PRN, Birder Robson, MD  Review of Systems  Constitutional: Negative.   Respiratory: Positive for shortness of breath.   Cardiovascular: Positive for leg swelling.  Skin:       Right great toe nail pain Pruritis  Neurological: Negative.     Social History   Tobacco Use  . Smoking status: Former Smoker    Last attempt to quit: 10/25/1980    Years since quitting: 37.2  . Smokeless tobacco: Never Used  . Tobacco comment: quit smoking in 1982  Substance Use Topics  . Alcohol use: Yes    Comment: 3 times in last year   Objective:   BP 130/70 (BP Location: Left Wrist, Patient Position: Sitting, Cuff Size: Normal)   Pulse 92   Temp 99.4 F (37.4 C) (Oral)   Resp 19   Wt (!) 307 lb 9.6 oz (139.5 kg)   SpO2 96%   BMI 46.77 kg/m  Vitals:   01/27/18 1314  BP: 130/70  Pulse: 92  Resp: 19  Temp: 99.4 F (37.4 C)  TempSrc: Oral  SpO2: 96%  Weight: (!) 307 lb 9.6 oz (139.5 kg)     Physical Exam  Constitutional: She appears well-developed and well-nourished. No distress.  HENT:  Swollen looking under eyes  Neck: Normal range of  motion. Neck supple.  Cardiovascular: Normal rate and regular rhythm. Exam reveals no gallop and no friction rub.  Murmur heard. Pulmonary/Chest: Effort normal. No respiratory distress. She has wheezes. She has no rales.  Musculoskeletal: She exhibits edema (2-3+ pitting edema).       Feet:  Skin: She is not diaphoretic.  Vitals reviewed.       Assessment & Plan:     1. Chronic diastolic heart failure (HCC) Starting to have more signs fluid overload again today. [employer] refilled today. Only use when weight is up 3 pounds in a day.  - metolazone (ZAROXOLYN) 2.5 MG tablet; Take 1 tablet (2.5 mg total) by mouth daily as needed (swelling).  Dispense: 90 tablet; Refill: 1  2. CKD (chronic kidney disease) stage 4, GFR 15-29 ml/min (HCC) Will check labs as below and f/u pending results. I will forward results to Dr. Juleen China. Suspect this is source of patient's itching as well.  - Renal Function Panel  3. Diabetes mellitus type 2, uncontrolled, with complications ([place]) Will check labs as below and f/u pending results. Hopefully A1c continues to improve so patient can reschedule her eye surgery.  - HgB A1c  4. Onychomycosis Tramadol given for pain. Referral placed to Dr. Paulla Dolly for consideration of complete removal of the right great toe nail since it is removed from the nail bed currently.  - traMADol (ULTRAM) 50 MG tablet; Take 1 tablet (50 mg total) by mouth every 8 (eight) hours as needed.  Dispense: 30 tablet; Refill: 0 - Ambulatory referral to [place], PA-C  [facility]

## 2018-01-27 NOTE — Telephone Encounter (Signed)
[name] from [facility] [phone] calling to let us know that she found a program for Ms.Matura at the Heart Failure clinic provider name is Otila Kluver and they are going to reach out to the patient to see if she is interested in the program. Per [name] she has been trying to get in contact with the patient but No answer. Per [name] this program have a paramedic go to the patients home to check at least twice a day to see how patient is doing and check vitals. [name] feels that patient would benefit a lot from this. Just FYI.  Do you want me to call patient and see if she wants to try this program?  Thanks,  -Korey Prashad

## 2018-01-27 NOTE — Progress Notes (Signed)
HPI:  [name] is a 62 y.o.  [phone]  who presents today for evaluation and management of abnormal cervical cytology.    Dysplasia History:  LGSIL, HPV positive  OB History  Gravida Para Term Preterm AB Living  _0 SAB TAB Ectopic Multiple Live Births  1       3    # Outcome Date GA Lbr Len/2nd Weight Sex Delivery Anes PTL Lv  4 SAB           3 Term           2 Term           1 Term             Past Medical History:  Diagnosis Date  . Anemia   . Aortic stenosis    Echo 8/18: mean 13, peak 28, LVOT/AV mean velocity 0.51  . Arthritis   . Asthma    As a child   . Bronchitis   . CAD (coronary artery disease)    a. 09/2016: 50% Ost 1st Mrg stenosis, 50% 2nd Mrg stenosis, 20% Mid-Cx, 95% Prox LAD, 40% mid-LAD, and 10% dist-LAD stenosis. Staged PCI with DES to Prox-LAD.   [name] Chronic combined systolic and diastolic CHF (congestive heart failure) ([place]) 2011   echo 2/18: EF 55-60, normal wall motion, grade 2 diastolic dysfunction, trivial AI // echo 3/18: Septal and apical HK, EF 45-50, normal wall motion, trivial AI, mild LAE, PASP 38 // echo 8/18: EF 60-65, normal wall motion, grade 1 diastolic dysfunction, calcified aortic valve leaflets, mild aortic stenosis (mean 13, peak 28, LVOT/AV mean velocity 0.51), mild AI, moderate MAC, mild LAE, trivial TR   . Chronic kidney disease    STAGE 4  . Depression   . Diabetes mellitus Dx 1989  . GERD (gastroesophageal reflux disease)   . Gout   . Hepatitis C Dx 2013  . Hypertension Dx 1989  . Obesity   . Obstructive sleep apnea   . Pancreatitis 2013  . Refusal of blood transfusions as patient is Jehovah's Witness   . Tendinitis   . Tremors of nervous system    LEFT HAND  . Ulcer 2010    Past Surgical History:  Procedure Laterality Date  . APPLICATION OF WOUND VAC Left 08/14/2017   Procedure: APPLICATION OF WOUND VAC Exchange;  Surgeon: Robert Bellow, MD;  Location: [facility];  Service: General;  Laterality: Left;    . CHOLECYSTECTOMY    . CORONARY ANGIOPLASTY     STENT  . CORONARY STENT INTERVENTION N/A 09/18/2016   Procedure: Coronary Stent Intervention;  Surgeon: Troy Sine, MD;  Location: [place] [facility];  Service: Cardiovascular;  Laterality: N/A;  . DRESSING CHANGE UNDER ANESTHESIA Left 08/15/2017   Procedure: exploration of wound for bleeding;  Surgeon: Robert Bellow, MD;  Location: [facility];  Service: General;  Laterality: Left;  . EYE SURGERY    . INCISION AND DRAINAGE ABSCESS Left 08/12/2017   Procedure: INCISION AND DRAINAGE ABSCESS;  Surgeon: Robert Bellow, MD;  Location: [facility];  Service: General;  Laterality: Left;  . KNEE ARTHROSCOPY    . LEFT HEART CATH N/A 09/18/2016   Procedure: Left Heart Cath;  Surgeon: Troy Sine, MD;  Location: [place] [facility];  Service: Cardiovascular;  Laterality: N/A;  . LEFT HEART CATH AND CORONARY ANGIOGRAPHY N/A 09/16/2016   Procedure: Left Heart Cath and Coronary Angiography;  Surgeon: Burnell Blanks, MD;  Location: [place] [facility];  Service: Cardiovascular;  Laterality: N/A;  . LEFT HEART CATH AND CORONARY ANGIOGRAPHY N/A 04/29/2017   Procedure: LEFT HEART CATH AND CORONARY ANGIOGRAPHY;  Surgeon: Nelva Bush, MD;  Location: [place] [facility];  Service: Cardiovascular;  Laterality: N/A;  . TUBAL LIGATION    . TUBAL LIGATION      SOCIAL HISTORY:  Social History   Substance and Sexual Activity  Alcohol Use Yes   Comment: 3 times in last year    Social History   Substance and Sexual Activity  Drug Use No  . Types: "Crack" cocaine   Comment: 08/21/2016 "clean since 05/1998"     Family History  Problem Relation Age of Onset  . Colon cancer Mother   . Heart attack Other   . Heart attack Maternal Grandmother   . Hypertension Sister   . Hypertension Brother   . Diabetes Paternal Grandmother   . Breast cancer Neg Hx     ALLERGIES:  Shellfish allergy; Diazepam; and Morphine and related  Current Outpatient  Medications on File Prior to Visit  Medication Sig Dispense Refill  . albuterol (PROVENTIL) (2.5 MG/3ML) 0.083% nebulizer solution Take 3 mLs (2.5 mg total) by nebulization every 6 (six) hours as needed for wheezing or shortness of breath. 150 mL 1  . albuterol (VENTOLIN HFA) 108 (90 Base) MCG/ACT inhaler Inhale 2 puffs into the lungs every 4 (four) hours as needed for wheezing or shortness of breath. 54 g 3  . allopurinol (ZYLOPRIM) 100 MG tablet TAKE (2) TABLETS BY MOUTH ONCE DAILY. 180 tablet 1  . aspirin EC 81 MG EC tablet Take 1 tablet (81 mg total) by mouth daily.    [name] atorvastatin (LIPITOR) 80 MG tablet Take 1 tablet (80 mg total) by mouth daily at 6 PM. (Patient taking differently: Take 80 mg by mouth every morning. ) 90 tablet 3  . Blood Glucose Monitoring Suppl (ACCU-CHEK AVIVA) device Use as instructed daily. 1 each 0  . Blood Pressure Monitoring (ADULT BLOOD PRESSURE CUFF LG) KIT Check blood pressure once daily 1 each 0  . carvedilol (COREG) 25 MG tablet TAKE 1 TABLET BY MOUTH 2 TIMES DAILY WITH A MEAL. (Patient taking differently: Take 25 mg by mouth 2 (two) times daily with a meal. ) 180 tablet 3  . Continuous Blood Gluc Receiver (FREESTYLE LIBRE 14 DAY READER) DEVI 1 each by Does not apply route every 14 (fourteen) days. 1 Device 24  . docusate sodium (COLACE) 100 MG capsule Take 1 capsule (100 mg total) by mouth 2 (two) times daily as needed for mild constipation. 30 capsule 0  . FEROSUL 325 (65 Fe) MG tablet TAKE (1) TABLET BY MOUTH EVERY OTHER DAY. 45 tablet 1  . fluticasone (FLONASE) 50 MCG/ACT nasal spray Place 2 sprays into both nostrils daily as needed for allergies or rhinitis. 48 g 1  . Fluticasone-Salmeterol (ADVAIR DISKUS) 250-50 MCG/DOSE AEPB Inhale 1 puff into the lungs 2 (two) times daily. 180 each 3  . glucose blood (ACCU-CHEK AVIVA) test strip Use as instructed daily 100 each 12  . glucose blood (TRUE METRIX BLOOD GLUCOSE TEST) test strip Use as instructed 100 each 12    . hydrALAZINE (APRESOLINE) 100 MG tablet Take 1 tablet (100 mg total) by mouth 3 (three) times daily. 270 tablet 1  . hydrOXYzine (ATARAX/VISTARIL) 25 MG tablet Take 1 tablet (25 mg total) by mouth 2 (two) times daily as needed. 180 tablet  1  . insulin aspart (NOVOLOG) 100 UNIT/ML FlexPen Inject 10 Units into the skin 3 (three) times daily with meals. (Patient taking differently: Inject 25 Units into the skin 2 (two) times daily. ) 15 mL 3  . Insulin Glargine (LANTUS SOLOSTAR) 100 UNIT/ML Solostar Pen Inject 60 Units into the skin 2 (two) times daily. 24 mL 0  . Insulin Pen Needle (PEN NEEDLES) 30G X 8 MM MISC 1 each by Does not apply route 3 (three) times daily before meals. AC for novolog injections 100 each 11  . isosorbide mononitrate (IMDUR) 30 MG 24 hr tablet TAKE 3 TABLETS(90 MG) BY MOUTH DAILY 270 tablet 1  . Lancet Devices (ACCU-CHEK SOFTCLIX) lancets Use as instructed daily. 1 each 5  . loratadine (CLARITIN) 10 MG tablet Take 1 tablet (10 mg total) by mouth daily. 90 tablet 1  . montelukast (SINGULAIR) 10 MG tablet Take 1 tablet (10 mg total) by mouth at bedtime. 90 tablet 1  . nitroGLYCERIN (NITROSTAT) 0.4 MG SL tablet Place 1 tablet (0.4 mg total) under the tongue every 5 (five) minutes x 3 doses as needed for chest pain. 25 tablet 12  . omeprazole (PRILOSEC) 20 MG capsule Take 1 capsule (20 mg total) by mouth daily. 90 capsule 3  . potassium chloride SA (K-DUR,KLOR-CON) 20 MEQ tablet Take 1 tablet (20 mEq total) by mouth 2 (two) times daily. 60 tablet 5  . pregabalin (LYRICA) 75 MG capsule Take 1 capsule (75 mg total) by mouth 2 (two) times daily. 180 capsule 1  . torsemide (DEMADEX) 20 MG tablet TAKE (2) TABLETS BY MOUTH TWICE DAILY. 120 tablet 5  . triamcinolone cream (KENALOG) 0.1 % Apply 1 application topically 2 (two) times daily. 80 g 0  . valACYclovir (VALTREX) 500 MG tablet TAKE (1) TABLET BY MOUTH EVERY OTHER DAY. 45 tablet 1  . Vitamin D, Ergocalciferol, (DRISDOL) 50000 units  CAPS capsule TAKE 1 CAPSULE BY MOUTH ONCE A MONTH 3 capsule 3  . Dulaglutide (TRULICITY) 3.79 KW/4.0XB SOPN Inject 0.75 mg into the skin once a week. (Patient taking differently: Inject 0.75 mg into the skin once a week. Thurs) 4 pen 3  . ticagrelor (BRILINTA) 90 MG TABS tablet Take 1 tablet (90 mg total) by mouth 2 (two) times daily. (Patient not taking: Reported on 01/27/2018) 180 tablet 3  . [DISCONTINUED] gabapentin (NEURONTIN) 100 MG capsule Take 1 capsule (100 mg total) by mouth 3 (three) times daily. 90 capsule 1   Current Facility-Administered Medications on File Prior to Visit  Medication Dose Route Frequency Provider Last Rate Last Dose  . 0.9 %  sodium chloride infusion   Intravenous Continuous Vanga, Tally Due, MD      . 0.9 %  sodium chloride infusion   Intravenous Continuous Piscitello, Precious Haws, MD      . moxifloxacin (VIGAMOX) 0.5 % ophthalmic solution 1 drop  1 drop Right Eye PRN Birder Robson, MD        Physical Exam: -Vitals:  BP 138/64 (BP Location: Left Arm, Patient Position: Sitting, Cuff Size: Large)   Pulse 96   Ht _0  (1.727 m)   Wt 298 lb (135.2 kg)   SpO2 96%   BMI 45.31 kg/m  GEN: WD, WN, NAD.  A+ O x 3, good mood and affect. ABD:  NT, ND.  Soft, no masses.  No hernias noted.   Pelvic:   Vulva: Normal appearance.  No lesions.  Vagina: No lesions or abnormalities noted.  Support: Normal pelvic support.  Urethra No masses tenderness or scarring.  Meatus Normal size without lesions or prolapse.  Cervix: See below.  Anus: Normal exam.  No lesions.  Perineum: Normal exam.  No lesions.        Bimanual   Uterus: Normal size.  Non-tender.  Mobile.  AV.  Adnexae: No masses.  Non-tender to palpation.  Cul-de-sac: Negative for abnormality.   PROCEDURE: 1.  Urine Pregnancy Test:  not done 2.  Colposcopy performed with 4% acetic acid after verbal consent obtained                                         -Aceto-white Lesions Location(s): diffusely  anterior and posteriorly               -Biopsy performed at 6 and 12 o'clock               -ECC indicated and performed: Yes.       -Biopsy sites made hemostatic with pressure, AgNO3, and/or Monsel's solution   -Satisfactory colposcopy: No.    -Evidence of Invasive cervical CA :  NO  ASSESSMENT:  [name] is a 62 y.o. [id number] here for  1. LGSIL on Pap smear of cervix   .  PLAN: I discussed the grading system of pap smears and HPV high risk viral types.  We will discuss and base management after colpo results return.  Note: this was a technically difficult colposcopy.  LEEP, if needed should be done in a more controlled setting.     Prentice Docker, MD  [facility] 01/27/2018  3:17 PM

## 2018-01-28 LAB — RENAL FUNCTION PANEL
Albumin: 3.7 g/dL (ref 3.5–5.5)
BUN/Creatinine Ratio: 29 — ABNORMAL HIGH (ref 9–23)
BUN: 82 mg/dL (ref 6–24)
CO2: 19 mmol/L — ABNORMAL LOW (ref 20–29)
Calcium: 8.3 mg/dL — ABNORMAL LOW (ref 8.7–10.2)
Chloride: 105 mmol/L (ref 96–106)
Creatinine, Ser: 2.78 mg/dL — ABNORMAL HIGH (ref 0.57–1.00)
GFR calc Af Amer: 21 mL/min/{1.73_m2} — ABNORMAL LOW (ref >59)
GFR calc non Af Amer: 18 mL/min/{1.73_m2} — ABNORMAL LOW (ref >59)
Glucose: 134 mg/dL — ABNORMAL HIGH (ref 65–99)
Phosphorus: 3.3 mg/dL (ref 2.5–4.5)
Potassium: 4.5 mmol/L (ref 3.5–5.2)
Sodium: 142 mmol/L (ref 134–144)

## 2018-01-28 LAB — HEMOGLOBIN A1C
Est. average glucose Bld gHb Est-mCnc: 246 mg/dL
Hgb A1c MFr Bld: 10.2 % — ABNORMAL HIGH (ref 4.8–5.6)

## 2018-01-28 NOTE — Telephone Encounter (Signed)
Yes I agree. 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 Lets see if she would be interested in [place] getting her into this program. There is also a lab result note for her so when we call her with results we can also see if she would like to join this program. Her labs are getting better so I think she is actually taking her meds currently.

## 2018-01-28 NOTE — Telephone Encounter (Signed)
Patient made aware and has already followed up on this.

## 2018-01-28 NOTE — Telephone Encounter (Signed)
No answer  Thanks,  -Antonino Nienhuis 

## 2018-01-28 NOTE — Telephone Encounter (Signed)
-----   Message from [name], PA-C sent at 01/28/2018  8:47 AM EDT ----- Kidney function is improving. A1c is down to 10.2. Keep up the good work. I will forward labs to Dr. Juleen China.

## 2018-01-31 ENCOUNTER — Ambulatory Visit: Payer: Medicaid Other | Admitting: Podiatry

## 2018-01-31 ENCOUNTER — Encounter: Payer: Self-pay | Admitting: Podiatry

## 2018-01-31 ENCOUNTER — Encounter: Payer: Self-pay | Admitting: *Deleted

## 2018-01-31 DIAGNOSIS — M79674 Pain in right toe(s): Secondary | ICD-10-CM

## 2018-01-31 DIAGNOSIS — L600 Ingrowing nail: Secondary | ICD-10-CM

## 2018-01-31 DIAGNOSIS — B351 Tinea unguium: Secondary | ICD-10-CM

## 2018-01-31 DIAGNOSIS — M79675 Pain in left toe(s): Secondary | ICD-10-CM

## 2018-01-31 NOTE — Telephone Encounter (Signed)
Patient advised as directed below. Per patient she spoke with [name] regarding the program.  Patient is also asking if you know exactly where the A1c number needs to be at for her to have her eye surgery.  Thanks,  -Rodnesha Elie.

## 2018-01-31 NOTE — Patient Instructions (Signed)
Soak Instructions    THE DAY AFTER THE PROCEDURE  Place 1/4 cup of epsom salts in a quart of warm tap water.  Submerge your foot or feet with outer bandage intact for the initial soak; this will allow the bandage to become moist and wet for easy lift off.  Once you remove your bandage, continue to soak in the solution for 20 minutes.  This soak should be done twice a day.  Next, remove your foot or feet from solution, blot dry the affected area and cover.  You may use a band aid large enough to cover the area or use gauze and tape.  Apply other medications to the area as directed by the doctor such as polysporin neosporin.  IF YOUR SKIN BECOMES IRRITATED WHILE USING THESE INSTRUCTIONS, IT IS OKAY TO SWITCH TO  WHITE VINEGAR AND WATER. Or you may use antibacterial soap and water to keep the toe clean  Monitor for any signs/symptoms of infection. Call the office immediately if any occur or go directly to the emergency room. Call with any questions/concerns.    Long Term Care Instructions-Post Nail Surgery  You have had your ingrown toenail and root treated with a chemical.  This chemical causes a burn that will drain and ooze like a blister.  This can drain for 6-8 weeks or longer.  It is important to keep this area clean, covered, and follow the soaking instructions dispensed at the time of your surgery.  This area will eventually dry and form a scab.  Once the scab forms you no longer need to soak or apply a dressing.  If at any time you experience an increase in pain, redness, swelling, or drainage, you should contact the office as soon as possible.  

## 2018-01-31 NOTE — Telephone Encounter (Signed)
LMTCB with eye surgeon number.  Thanks,  -Latoyia Tecson

## 2018-01-31 NOTE — Telephone Encounter (Signed)
Can you see the name of her eye surgeon so I can call him please.

## 2018-02-01 ENCOUNTER — Telehealth: Payer: Self-pay

## 2018-02-01 ENCOUNTER — Telehealth: Payer: Self-pay | Admitting: Podiatry

## 2018-02-01 ENCOUNTER — Telehealth (HOSPITAL_COMMUNITY): Payer: Self-pay

## 2018-02-01 NOTE — Telephone Encounter (Signed)
Melissa with [facility] states patient requested Continuous Blood Gluc Receiver (FREESTYLE LIBRE 14 DAY READER) [place] but Medicaid will only pay for Accu-Chek [place]. She states a PA can be attempted but she is unsure if they will approve a different devise.

## 2018-02-01 NOTE — Telephone Encounter (Signed)
I saw Dr. Paulla Dolly yesterday and he removed my toenail. I took tylenol and tramadol last night because I was in terrible pain all night and only got about two hours of sleep. I'm still in pain and I was wondering if he could give me something for this pain? My number is [phone].

## 2018-02-01 NOTE — Telephone Encounter (Signed)
Should I do a PA?

## 2018-02-01 NOTE — Telephone Encounter (Signed)
Pt was referred to [facility] program, I called pt however the call went straight to VM. I left a message asking for a return call.   Marylouise Stacks, EMT-Paramedic  02/01/18

## 2018-02-01 NOTE — Progress Notes (Deleted)
[facility] Consultation      Assessment and Plan:  Excessive daytime sleepiness.  - Sleep study negative for obstructive sleep apnea.  Chronic diastolic congestive heart failure. - Likely contributing to dyspnea, continue follow-up with heart failure clinic.  Morbid obesity.  -Likely contributing to dyspnea, weight loss recommended.  Dyspnea on exertion. -Likely multifactorial secondary to above, continue management of underlying conditions, patient also has some degree of debility/deconditioning suspected.   No orders of the defined types were placed in this encounter.  No follow-ups on file.     Date: 02/01/2018  MRN# [id number] [name] 1956-05-08  Referring Physician: PCP.  [name] is a 62 y.o. old female seen in consultation for chief complaint of:    No chief complaint on file.   HPI:   [name] is a 62 y/o female with a history of asthma, CAD, DM, HTN, CKD, hepatitis C, pancreatitis, previous tobacco use and chronic diastolic heart failure with morbid obesity and chronic dyspnea on exertion.  At last visit she was sent for sleep study which did not show evidence of obstructive sleep apnea. She lives at home with her daughter, she does all her own ADL's and looks after grandkids of 2yo and 4 months, she also has a niece who comes in and helps look after the kids.   **Desat walk 10/26/17>> on ra at rest; sat 98% and HR 84. Walked 180 at slow pace with moderate dyspnea with sat of 95% and HR 98.  **chest x-[facility] 10/03/17>> cardiomegaly, in particular there is right heart enlargement, bibasilar atelectasis. Spirometry 09/28/17.  FVC is 1.84 L, 59%, FEV1 is 1.48 L, 60%, ratio is 80%, there was no bronchodilator given.  This test is overall consistent with restrictive lung disease though this appears to be in a suboptimal study. **Echo 09/15/17 >>reviewed and showed an EF of 65-70%, moderate AS, trivial AR and calcified mitral valve annulus. Cardiac  catheterization done 04/29/17 which showed a widely patient mid LAD stent and normal left ventricular filling pressure.  Medication:    Current Outpatient Medications:  .  albuterol (PROVENTIL) (2.5 MG/3ML) 0.083% nebulizer solution, Take 3 mLs (2.5 mg total) by nebulization every 6 (six) hours as needed for wheezing or shortness of breath., Disp: 150 mL, Rfl: 1 .  albuterol (VENTOLIN HFA) 108 (90 Base) MCG/ACT inhaler, Inhale 2 puffs into the lungs every 4 (four) hours as needed for wheezing or shortness of breath., Disp: 54 g, Rfl: 3 .  allopurinol (ZYLOPRIM) 100 MG tablet, TAKE (2) TABLETS BY MOUTH ONCE DAILY., Disp: 180 tablet, Rfl: 1 .  aspirin EC 81 MG EC tablet, Take 1 tablet (81 mg total) by mouth daily., Disp: , Rfl:  .  atorvastatin (LIPITOR) 80 MG tablet, Take 1 tablet (80 mg total) by mouth daily at 6 PM. (Patient taking differently: Take 80 mg by mouth every morning. ), Disp: 90 tablet, Rfl: 3 .  Blood Glucose Monitoring Suppl (ACCU-CHEK AVIVA) device, Use as instructed daily., Disp: 1 each, Rfl: 0 .  Blood Pressure Monitoring (ADULT BLOOD PRESSURE CUFF LG) KIT, Check blood pressure once daily, Disp: 1 each, Rfl: 0 .  carvedilol (COREG) 25 MG tablet, TAKE 1 TABLET BY MOUTH 2 TIMES DAILY WITH A MEAL. (Patient taking differently: Take 25 mg by mouth 2 (two) times daily with a meal. ), Disp: 180 tablet, Rfl: 3 .  Continuous Blood Gluc Receiver (FREESTYLE LIBRE 14 DAY READER) DEVI, 1 each by Does not apply route every  14 (fourteen) days., Disp: 1 Device, Rfl: 24 .  docusate sodium (COLACE) 100 MG capsule, Take 1 capsule (100 mg total) by mouth 2 (two) times daily as needed for mild constipation., Disp: 30 capsule, Rfl: 0 .  Dulaglutide (TRULICITY) 2.26 JF/3.5KT SOPN, Inject 0.75 mg into the skin once a week. (Patient taking differently: Inject 0.75 mg into the skin once a week. Thurs), Disp: 4 pen, Rfl: 3 .  FEROSUL 325 (65 Fe) MG tablet, TAKE (1) TABLET BY MOUTH EVERY OTHER DAY., Disp: 45  tablet, Rfl: 1 .  fluticasone (FLONASE) 50 MCG/ACT nasal spray, Place 2 sprays into both nostrils daily as needed for allergies or rhinitis., Disp: 48 g, Rfl: 1 .  Fluticasone-Salmeterol (ADVAIR DISKUS) 250-50 MCG/DOSE AEPB, Inhale 1 puff into the lungs 2 (two) times daily., Disp: 180 each, Rfl: 3 .  glucose blood (ACCU-CHEK AVIVA) test strip, Use as instructed daily, Disp: 100 each, Rfl: 12 .  glucose blood (TRUE METRIX BLOOD GLUCOSE TEST) test strip, Use as instructed, Disp: 100 each, Rfl: 12 .  hydrALAZINE (APRESOLINE) 100 MG tablet, Take 1 tablet (100 mg total) by mouth 3 (three) times daily., Disp: 270 tablet, Rfl: 1 .  hydrOXYzine (ATARAX/VISTARIL) 25 MG tablet, Take 1 tablet (25 mg total) by mouth 2 (two) times daily as needed., Disp: 180 tablet, Rfl: 1 .  insulin aspart (NOVOLOG) 100 UNIT/ML FlexPen, Inject 10 Units into the skin 3 (three) times daily with meals. (Patient taking differently: Inject 25 Units into the skin 2 (two) times daily. ), Disp: 15 mL, Rfl: 3 .  Insulin Glargine (LANTUS SOLOSTAR) 100 UNIT/ML Solostar Pen, Inject 60 Units into the skin 2 (two) times daily., Disp: 24 mL, Rfl: 0 .  Insulin Pen Needle (PEN NEEDLES) 30G X 8 MM MISC, 1 each by Does not apply route 3 (three) times daily before meals. AC for novolog injections, Disp: 100 each, Rfl: 11 .  isosorbide mononitrate (IMDUR) 30 MG 24 hr tablet, TAKE 3 TABLETS(90 MG) BY MOUTH DAILY, Disp: 270 tablet, Rfl: 1 .  Lancet Devices (ACCU-CHEK SOFTCLIX) lancets, Use as instructed daily., Disp: 1 each, Rfl: 5 .  loratadine (CLARITIN) 10 MG tablet, Take 1 tablet (10 mg total) by mouth daily., Disp: 90 tablet, Rfl: 1 .  metolazone (ZAROXOLYN) 2.5 MG tablet, Take 1 tablet (2.5 mg total) by mouth daily as needed (swelling)., Disp: 90 tablet, Rfl: 1 .  montelukast (SINGULAIR) 10 MG tablet, Take 1 tablet (10 mg total) by mouth at bedtime., Disp: 90 tablet, Rfl: 1 .  nitroGLYCERIN (NITROSTAT) 0.4 MG SL tablet, Place 1 tablet (0.4 mg  total) under the tongue every 5 (five) minutes x 3 doses as needed for chest pain., Disp: 25 tablet, Rfl: 12 .  omeprazole (PRILOSEC) 20 MG capsule, Take 1 capsule (20 mg total) by mouth daily., Disp: 90 capsule, Rfl: 3 .  potassium chloride SA (K-DUR,KLOR-CON) 20 MEQ tablet, Take 1 tablet (20 mEq total) by mouth 2 (two) times daily., Disp: 60 tablet, Rfl: 5 .  pregabalin (LYRICA) 75 MG capsule, Take 1 capsule (75 mg total) by mouth 2 (two) times daily., Disp: 180 capsule, Rfl: 1 .  SENNA PLUS 8.6-50 MG tablet, , Disp: , Rfl: 5 .  ticagrelor (BRILINTA) 90 MG TABS tablet, Take 1 tablet (90 mg total) by mouth 2 (two) times daily., Disp: 180 tablet, Rfl: 3 .  torsemide (DEMADEX) 20 MG tablet, TAKE (2) TABLETS BY MOUTH TWICE DAILY., Disp: 120 tablet, Rfl: 5 .  traMADol (ULTRAM) 50 MG  tablet, Take 1 tablet (50 mg total) by mouth every 8 (eight) hours as needed., Disp: 30 tablet, Rfl: 0 .  triamcinolone cream (KENALOG) 0.1 %, Apply 1 application topically 2 (two) times daily., Disp: 80 g, Rfl: 0 .  valACYclovir (VALTREX) 500 MG tablet, TAKE (1) TABLET BY MOUTH EVERY OTHER DAY., Disp: 45 tablet, Rfl: 1 .  Vitamin D, Ergocalciferol, (DRISDOL) 50000 units CAPS capsule, TAKE 1 CAPSULE BY MOUTH ONCE A MONTH, Disp: 3 capsule, Rfl: 3 No current facility-administered medications for this visit.   Facility-Administered Medications Ordered in Other Visits:  .  0.9 %  sodium chloride infusion, , Intravenous, Continuous, Vanga, Tally Due, MD .  0.9 %  sodium chloride infusion, , Intravenous, Continuous, Piscitello, Precious Haws, MD .  moxifloxacin (VIGAMOX) 0.5 % ophthalmic solution 1 drop, 1 drop, Right Eye, PRN, Porfilio, William, MD   Allergies:  Shellfish allergy; Diazepam; and Morphine and related      LABORATORY PANEL:   CBC No results for input(s): WBC, HGB, HCT, PLT in the last 168  hours. ------------------------------------------------------------------------------------------------------------------  Chemistries  Recent Labs  Lab 01/27/18 1353  NA 142  K 4.5  CL 105  CO2 19*  GLUCOSE 134*  BUN 82*  CREATININE 2.78*  CALCIUM 8.3*   ------------------------------------------------------------------------------------------------------------------  Cardiac Enzymes No results for input(s): TROPONINI in the last 168 hours. ------------------------------------------------------------  RADIOLOGY:  No results found.     Thank  you for the consultation and for allowing [facility] to assist in the care of your patient. Our recommendations are noted above.  Please contact us if we can be of further service.   Marda Stalker, MD.  Board Certified in Internal Medicine, Pulmonary Medicine, [place], and Sleep Medicine.  [facility] Office Number: [phone]  Patricia Pesa, M.D.  Merton Border, M.D  02/01/2018

## 2018-02-01 NOTE — Telephone Encounter (Signed)
I asked pt if she had begun the soaks and she states no. I told pt the post procedure dressing was put on snuggly to decrease any postop bleeding, and if she was having pain she needed to begin her soaks and take the tramadol and ibuprofen inbetween the doses of tramadol, and call with concerns.

## 2018-02-01 NOTE — Telephone Encounter (Signed)
We can try but I am sure it will prob be denied, but worth a try.

## 2018-02-02 ENCOUNTER — Telehealth: Payer: Self-pay | Admitting: Podiatry

## 2018-02-02 ENCOUNTER — Ambulatory Visit: Payer: Medicaid Other | Admitting: Internal Medicine

## 2018-02-02 NOTE — Telephone Encounter (Signed)
I told pt the procedure Dr. Paulla Dolly performed would only cause the toe the procedure was performed to be numb, any other numbness would need to be evaluated by a doctor. I asked pt if she had neuropathy and she said she use to. I told pt that she needed to contact her doctor that managed her neuropathy and she said her primary doctor. I told her to use the ibuprofen and tramadol to manage the pain and continue the soak and antibiotic ointment.

## 2018-02-02 NOTE — Telephone Encounter (Signed)
Dr. Paulla Dolly did a procedure on Monday morning to remove my toenail. My toe/feet are numb and its painful and I cannot move it. If somebody could please get back with me at [phone]. Thank you.

## 2018-02-02 NOTE — Progress Notes (Signed)
Subjective:   Patient ID: [name], female   DOB: 62 y.o.   MRN: [id number]   HPI Patient states my right big toenail is lifting off the bed and is been very sore and I cannot cut it.  I do not remember specific trauma to the area to been going on for the last 2 to 4 weeks.  Also the second nail is thickened   ROS      Objective:  Physical Exam  Neurovascular status intact with patient found to have thickened damaged second nail right and loose thickened brittle big toenail right that is painful when palpated     Assessment:  Significant trauma to the right hallux nail with thick dystrophic changes and pain with thickness of the second nail but not loose     Plan:  Discussed both conditions and at this time I went ahead and I infiltrated the right hallux 60 mg like Marcaine mixture sterile prep applied to the toe and using sterile instrumentation I remove the hallux nail in in the entirety removed the matrix to flush the area out did not note any drainage and applied sterile dressing.  Gave instructions on soaks debrided the second nail and explained ultimately may require permanent procedure but were trying to hold off due to her long-term diabetic history and this should heal uneventfully

## 2018-02-03 ENCOUNTER — Encounter: Payer: Self-pay | Admitting: Certified Registered"

## 2018-02-03 ENCOUNTER — Ambulatory Visit: Payer: Medicaid Other | Admitting: Anesthesiology

## 2018-02-03 ENCOUNTER — Encounter
Admission: RE | Disposition: A | Discharge status: Home / Self Care | Payer: Self-pay | Source: Ambulatory Visit | Attending: Gastroenterology

## 2018-02-03 ENCOUNTER — Ambulatory Visit
Admission: RE | Admit: 2018-02-03 | Discharge: 2018-02-03 | Disposition: A | Discharge status: Home / Self Care | Payer: Medicaid Other | Source: Ambulatory Visit | Attending: Gastroenterology | Admitting: Gastroenterology

## 2018-02-03 ENCOUNTER — Ambulatory Visit: Payer: Medicaid Other | Admitting: General Surgery

## 2018-02-03 DIAGNOSIS — E1122 Type 2 diabetes mellitus with diabetic chronic kidney disease: Secondary | ICD-10-CM | POA: Insufficient documentation

## 2018-02-03 DIAGNOSIS — I2510 Atherosclerotic heart disease of native coronary artery without angina pectoris: Secondary | ICD-10-CM | POA: Insufficient documentation

## 2018-02-03 DIAGNOSIS — Z7982 Long term (current) use of aspirin: Secondary | ICD-10-CM | POA: Insufficient documentation

## 2018-02-03 DIAGNOSIS — D123 Benign neoplasm of transverse colon: Secondary | ICD-10-CM | POA: Diagnosis not present

## 2018-02-03 DIAGNOSIS — E669 Obesity, unspecified: Secondary | ICD-10-CM | POA: Diagnosis not present

## 2018-02-03 DIAGNOSIS — Z87891 Personal history of nicotine dependence: Secondary | ICD-10-CM | POA: Insufficient documentation

## 2018-02-03 DIAGNOSIS — I130 Hypertensive heart and chronic kidney disease with heart failure and stage 1 through stage 4 chronic kidney disease, or unspecified chronic kidney disease: Secondary | ICD-10-CM | POA: Insufficient documentation

## 2018-02-03 DIAGNOSIS — B182 Chronic viral hepatitis C: Secondary | ICD-10-CM | POA: Diagnosis not present

## 2018-02-03 DIAGNOSIS — G4733 Obstructive sleep apnea (adult) (pediatric): Secondary | ICD-10-CM | POA: Diagnosis not present

## 2018-02-03 DIAGNOSIS — N184 Chronic kidney disease, stage 4 (severe): Secondary | ICD-10-CM | POA: Insufficient documentation

## 2018-02-03 DIAGNOSIS — Z955 Presence of coronary angioplasty implant and graft: Secondary | ICD-10-CM | POA: Diagnosis not present

## 2018-02-03 DIAGNOSIS — D122 Benign neoplasm of ascending colon: Secondary | ICD-10-CM

## 2018-02-03 DIAGNOSIS — Z794 Long term (current) use of insulin: Secondary | ICD-10-CM | POA: Insufficient documentation

## 2018-02-03 DIAGNOSIS — Z7951 Long term (current) use of inhaled steroids: Secondary | ICD-10-CM | POA: Diagnosis not present

## 2018-02-03 DIAGNOSIS — Z13810 Encounter for screening for upper gastrointestinal disorder: Secondary | ICD-10-CM | POA: Diagnosis not present

## 2018-02-03 DIAGNOSIS — Z1211 Encounter for screening for malignant neoplasm of colon: Secondary | ICD-10-CM | POA: Diagnosis not present

## 2018-02-03 DIAGNOSIS — M109 Gout, unspecified: Secondary | ICD-10-CM | POA: Insufficient documentation

## 2018-02-03 DIAGNOSIS — D631 Anemia in chronic kidney disease: Secondary | ICD-10-CM | POA: Insufficient documentation

## 2018-02-03 DIAGNOSIS — Z79899 Other long term (current) drug therapy: Secondary | ICD-10-CM | POA: Insufficient documentation

## 2018-02-03 DIAGNOSIS — J45909 Unspecified asthma, uncomplicated: Secondary | ICD-10-CM | POA: Insufficient documentation

## 2018-02-03 DIAGNOSIS — K635 Polyp of colon: Secondary | ICD-10-CM | POA: Diagnosis not present

## 2018-02-03 DIAGNOSIS — I5042 Chronic combined systolic (congestive) and diastolic (congestive) heart failure: Secondary | ICD-10-CM | POA: Diagnosis not present

## 2018-02-03 DIAGNOSIS — D120 Benign neoplasm of cecum: Secondary | ICD-10-CM

## 2018-02-03 DIAGNOSIS — K5909 Other constipation: Secondary | ICD-10-CM

## 2018-02-03 DIAGNOSIS — Z6841 Body Mass Index (BMI) 40.0 and over, adult: Secondary | ICD-10-CM | POA: Diagnosis not present

## 2018-02-03 HISTORY — PX: COLONOSCOPY WITH PROPOFOL: SHX5780

## 2018-02-03 HISTORY — PX: ESOPHAGOGASTRODUODENOSCOPY (EGD) WITH PROPOFOL: SHX5813

## 2018-02-03 LAB — GLUCOSE, CAPILLARY: Glucose-Capillary: 206 mg/dL — ABNORMAL HIGH (ref 70–99)

## 2018-02-03 SURGERY — ESOPHAGOGASTRODUODENOSCOPY (EGD) WITH PROPOFOL
Anesthesia: General

## 2018-02-03 MED ORDER — KETAMINE HCL 50 MG/ML IJ SOLN
INTRAMUSCULAR | Status: AC
  Filled 2018-02-03: qty 10

## 2018-02-03 MED ORDER — GLYCOPYRROLATE 0.2 MG/ML IJ SOLN
INTRAMUSCULAR | Status: DC | PRN
  Administered 2018-02-03: 0.1 mg via INTRAVENOUS

## 2018-02-03 MED ORDER — MIDAZOLAM HCL 2 MG/2ML IJ SOLN
INTRAMUSCULAR | Status: DC | PRN
  Administered 2018-02-03: 2 mg via INTRAVENOUS

## 2018-02-03 MED ORDER — LIDOCAINE HCL (PF) 1 % IJ SOLN
INTRAMUSCULAR | Status: AC
  Filled 2018-02-03: qty 2

## 2018-02-03 MED ORDER — LIDOCAINE HCL (CARDIAC) PF 100 MG/5ML IV SOSY
PREFILLED_SYRINGE | INTRAVENOUS | Status: DC | PRN
  Administered 2018-02-03: 50 mg via INTRAVENOUS

## 2018-02-03 MED ORDER — MIDAZOLAM HCL 2 MG/2ML IJ SOLN
INTRAMUSCULAR | Status: AC
  Filled 2018-02-03: qty 2

## 2018-02-03 MED ORDER — PROPOFOL 500 MG/50ML IV EMUL
INTRAVENOUS | Status: AC
Start: 2018-02-03 — End: ?
  Filled 2018-02-03: qty 50

## 2018-02-03 MED ORDER — PROPOFOL 500 MG/50ML IV EMUL
INTRAVENOUS | Status: DC | PRN
  Administered 2018-02-03: 75 ug/kg/min via INTRAVENOUS

## 2018-02-03 MED ORDER — KETAMINE HCL 10 MG/ML IJ SOLN
INTRAMUSCULAR | Status: DC | PRN
  Administered 2018-02-03: 20 mg via INTRAVENOUS
  Administered 2018-02-03: 30 mg via INTRAVENOUS

## 2018-02-03 MED ORDER — PROPOFOL 10 MG/ML IV BOLUS
INTRAVENOUS | Status: AC
  Filled 2018-02-03: qty 40

## 2018-02-03 NOTE — Anesthesia Post-op Follow-up Note (Signed)
Anesthesia QCDR form completed.        

## 2018-02-03 NOTE — Anesthesia Preprocedure Evaluation (Signed)
Anesthesia Evaluation  Patient identified by MRN, date of birth, ID band Patient awake    Reviewed: Allergy & Precautions, H&P , NPO status , Patient's Chart, lab work & pertinent test results  History of Anesthesia Complications Negative for: history of anesthetic complications  Airway Mallampati: III  TM Distance: >3 FB Neck ROM: full    Dental  (+) Chipped, Poor Dentition, Missing, Partial Upper   Pulmonary neg shortness of breath, asthma , sleep apnea , former smoker,           Cardiovascular Exercise Tolerance: Good hypertension, (-) angina+ CAD and +CHF  (-) Past MI and (-) DOE      Neuro/Psych PSYCHIATRIC DISORDERS Depression negative neurological ROS     GI/Hepatic GERD  ,(+) Hepatitis -  Endo/Other  diabetes, Type 2, Insulin Dependent  Renal/GU Renal disease  negative genitourinary   Musculoskeletal  (+) Arthritis ,   Abdominal   Peds  Hematology negative hematology ROS (+)   Anesthesia Other Findings Past Medical History: No date: Anemia No date: Aortic stenosis     Comment:  Echo 8/18: mean 13, peak 28, LVOT/AV mean velocity 0.51 No date: Arthritis No date: Asthma     Comment:  As a child  No date: Bronchitis No date: CAD (coronary artery disease)     Comment:  a. 09/2016: 50% Ost 1st Mrg stenosis, 50% 2nd Mrg               stenosis, 20% Mid-Cx, 95% Prox LAD, 40% mid-LAD, and 10%               dist-LAD stenosis. Staged PCI with DES to Prox-LAD.  2011: Chronic combined systolic and diastolic CHF (congestive heart  failure) (HCC)     Comment:  echo 2/18: EF 55-60, normal wall motion, grade 2               diastolic dysfunction, trivial AI // echo 3/18: Septal               and apical HK, EF 45-50, normal wall motion, trivial AI,               mild LAE, PASP 38 // echo 8/18: EF 60-65, normal wall               motion, grade 1 diastolic dysfunction, calcified aortic               valve leaflets,  mild aortic stenosis (mean 13, peak 28,               LVOT/AV mean velocity 0.51), mild AI, moderate MAC, mild               LAE, trivial TR  No date: Chronic kidney disease     Comment:  STAGE 4 No date: Depression Dx 1989: Diabetes mellitus No date: GERD (gastroesophageal reflux disease) No date: Gout Dx 2013: Hepatitis C Dx 1989: Hypertension No date: Obesity No date: Obstructive sleep apnea 2013: Pancreatitis No date: Refusal of blood transfusions as patient is Jehovah's Witness No date: Tendinitis No date: Tremors of nervous system     Comment:  LEFT HAND 2010: Ulcer  Past Surgical History: 0/09/8880: APPLICATION OF WOUND VAC; Left     Comment:  Procedure: APPLICATION OF WOUND VAC Exchange;  Surgeon:               Robert Bellow, MD;  Location: [facility] ORS;  Service:  General;  Laterality: Left; No date: CHOLECYSTECTOMY No date: CORONARY ANGIOPLASTY     Comment:  STENT 09/18/2016: CORONARY STENT INTERVENTION; N/A     Comment:  Procedure: Coronary Stent Intervention;  Surgeon: Troy Sine, MD;  Location: [place] [facility];  Service:               Cardiovascular;  Laterality: N/A; 08/15/2017: DRESSING CHANGE UNDER ANESTHESIA; Left     Comment:  Procedure: exploration of wound for bleeding;  Surgeon:               Robert Bellow, MD;  Location: [facility] ORS;  Service:               General;  Laterality: Left; No date: EYE SURGERY 08/12/2017: INCISION AND DRAINAGE ABSCESS; Left     Comment:  Procedure: INCISION AND DRAINAGE ABSCESS;  Surgeon:               Robert Bellow, MD;  Location: [facility] ORS;  Service:               General;  Laterality: Left; No date: KNEE ARTHROSCOPY 09/18/2016: LEFT HEART CATH; N/A     Comment:  Procedure: Left Heart Cath;  Surgeon: Troy Sine,               MD;  Location: [place] [facility];  Service:               Cardiovascular;  Laterality: N/A; 09/16/2016: LEFT HEART CATH AND CORONARY ANGIOGRAPHY; N/A      Comment:  Procedure: Left Heart Cath and Coronary Angiography;                Surgeon: Burnell Blanks, MD;  Location: [place] [facility];  Service: Cardiovascular;  Laterality:               N/A; 04/29/2017: LEFT HEART CATH AND CORONARY ANGIOGRAPHY; N/A     Comment:  Procedure: LEFT HEART CATH AND CORONARY ANGIOGRAPHY;                Surgeon: Nelva Bush, MD;  Location: [place] [facility]               [facility];  Service: Cardiovascular;  Laterality: N/A; No date: TUBAL LIGATION No date: TUBAL LIGATION  BMI    Body Mass Index:  45.01 kg/m      Reproductive/Obstetrics negative OB ROS                             Anesthesia Physical Anesthesia Plan  ASA: III  Anesthesia Plan: General   Post-op Pain Management:    Induction: Intravenous  PONV Risk Score and Plan: Propofol infusion and TIVA  Airway Management Planned: Natural Airway and Nasal Cannula  Additional Equipment:   Intra-op Plan:   Post-operative Plan:   Informed Consent: I have reviewed the patients History and Physical, chart, labs and discussed the procedure including the risks, benefits and alternatives for the proposed anesthesia with the patient or authorized representative who has indicated his/her understanding and acceptance.   Dental Advisory Given  Plan Discussed with: Anesthesiologist, CRNA and Surgeon  Anesthesia Plan Comments: (Patient consented for risks of anesthesia including but not limited to:  - adverse reactions to medications -  risk of intubation if required - damage to teeth, lips or other oral mucosa - sore throat or hoarseness - Damage to heart, brain, lungs or loss of life  Patient voiced understanding.)        Anesthesia Quick Evaluation

## 2018-02-03 NOTE — Transfer of Care (Signed)
Immediate Anesthesia Transfer of Care Note  Patient: [name]  Procedure(s) Performed: ESOPHAGOGASTRODUODENOSCOPY (EGD) WITH PROPOFOL (N/A ) COLONOSCOPY WITH PROPOFOL (N/A )  Patient Location: PACU and Endoscopy Unit  Anesthesia Type:General  Level of Consciousness: drowsy and patient cooperative  Airway & Oxygen Therapy: Patient Spontanous Breathing  Post-op Assessment: Report given to RN, Post -op Vital signs reviewed and stable and Patient moving all extremities  Post vital signs: Reviewed and stable  Last Vitals:  Vitals Value Taken Time  BP 125/70 02/03/2018 12:01 PM  Temp 36 C 02/03/2018 12:02 PM  Pulse 80 02/03/2018 12:02 PM  Resp 13 02/03/2018 12:02 PM  SpO2 95 % 02/03/2018 12:02 PM  Vitals shown include unvalidated device data.  Last Pain:  Vitals:   02/03/18 0835  TempSrc: Tympanic  PainSc: 0-No pain         Complications: No apparent anesthesia complications

## 2018-02-03 NOTE — Op Note (Signed)
[facility] Patient Name: [name] Procedure Date: 02/03/2018 11:21 AM MRN: [id number] Account #: [id number] Date of Birth: 23-Aug-1955 Admit Type: Outpatient Age: 62 Room: [facility] ENDO ROOM 3 Gender: Female Note Status: Finalized Procedure:            Upper GI endoscopy Indications:          Variceal screening (no known varices or prior bleeding) Providers:            Lin Landsman MD, MD Referring MD:         [name] (Referring MD) Medicines:            Monitored Anesthesia Care Complications:        No immediate complications. Estimated blood loss: None. Procedure:            Pre-Anesthesia Assessment:                       - Prior to the procedure, a History and Physical was                        performed, and patient medications and allergies were                        reviewed. The patient is competent. The risks and                        benefits of the procedure and the sedation options and                        risks were discussed with the patient. All questions                        were answered and informed consent was obtained.                        Patient identification and proposed procedure were                        verified by the physician, the nurse, the                        anesthesiologist, the anesthetist and the technician in                        the pre-procedure area in the procedure room in the                        endoscopy suite. Mental Status Examination: alert and                        oriented. Airway Examination: normal oropharyngeal                        airway and neck mobility. Respiratory Examination:                        clear to auscultation. CV Examination: normal.                        Prophylactic Antibiotics: The patient does not require  prophylactic antibiotics. Prior Anticoagulants: The                        patient has taken no previous  anticoagulant or                        antiplatelet agents. ASA Grade Assessment: III - A                        patient with severe systemic disease. After reviewing                        the risks and benefits, the patient was deemed in                        satisfactory condition to undergo the procedure. The                        anesthesia plan was to use monitored anesthesia care                        (MAC). Immediately prior to administration of                        medications, the patient was re-assessed for adequacy                        to receive sedatives. The heart rate, respiratory rate,                        oxygen saturations, blood pressure, adequacy of                        pulmonary ventilation, and response to care were                        monitored throughout the procedure. The physical status                        of the patient was re-assessed after the procedure.                       After obtaining informed consent, the endoscope was                        passed under direct vision. Throughout the procedure,                        the patient's blood pressure, pulse, and oxygen                        saturations were monitored continuously. The Endoscope                        was introduced through the mouth, and advanced to the                        second part of duodenum. The upper GI endoscopy was  accomplished without difficulty. The patient tolerated                        the procedure fairly well. Findings:      The duodenal bulb and second portion of the duodenum were normal.      The entire examined stomach was normal.      The gastroesophageal junction and examined esophagus were normal. Impression:           - Normal duodenal bulb and second portion of the                        duodenum.                       - Normal stomach.                       - Normal gastroesophageal junction and esophagus.                        - No specimens collected. Recommendation:       - Treat chronic hep C                       - Proceed with colonoscopy as scheduled                       - See colonoscopy report Procedure Code(s):    --- Professional ---                       [id number], Esophagogastroduodenoscopy, flexible, transoral;                        diagnostic, including collection of specimen(s) by                        brushing or washing, when performed (separate procedure) Diagnosis Code(s):    --- Professional ---                       O83.254, Encounter for screening for upper                        gastrointestinal disorder CPT copyright 2017 [employer]. All rights reserved. The codes documented in this report are preliminary and upon coder review may  be revised to meet current compliance requirements. Dr. Ulyess Mort Lin Landsman MD, MD 02/03/2018 11:32:27 AM This report has been signed electronically. Number of Addenda: 0 Note Initiated On: 02/03/2018 11:21 AM      [facility]

## 2018-02-03 NOTE — Op Note (Signed)
[facility] Gastroenterology Patient Name: [name] Procedure Date: 02/03/2018 11:20 AM MRN: [id number] Account #: [id number] Date of Birth: September 01, 1955 Admit Type: Outpatient Age: 62 Room: [facility] ENDO ROOM 3 Gender: Female Note Status: Finalized Procedure:            Colonoscopy Indications:          Screening for colorectal malignant neoplasm, This is                        the patient's first colonoscopy Providers:            Lin Landsman MD, MD Referring MD:         [name] (Referring MD) Medicines:            Monitored Anesthesia Care Complications:        No immediate complications. Estimated blood loss: None. Procedure:            Pre-Anesthesia Assessment:                       - Prior to the procedure, a History and Physical was                        performed, and patient medications and allergies were                        reviewed. The patient is competent. The risks and                        benefits of the procedure and the sedation options and                        risks were discussed with the patient. All questions                        were answered and informed consent was obtained.                        Patient identification and proposed procedure were                        verified by the physician, the nurse, the                        anesthesiologist, the anesthetist and the technician in                        the pre-procedure area in the procedure room in the                        endoscopy suite. Mental Status Examination: alert and                        oriented. Airway Examination: normal oropharyngeal                        airway and neck mobility. Respiratory Examination:                        clear to auscultation. CV Examination: normal.  Prophylactic Antibiotics: The patient does not require                        prophylactic antibiotics. Prior Anticoagulants: The             patient has taken no previous anticoagulant or                        antiplatelet agents. ASA Grade Assessment: III - A                        patient with severe systemic disease. After reviewing                        the risks and benefits, the patient was deemed in                        satisfactory condition to undergo the procedure. The                        anesthesia plan was to use monitored anesthesia care                        (MAC). Immediately prior to administration of                        medications, the patient was re-assessed for adequacy                        to receive sedatives. The heart rate, respiratory rate,                        oxygen saturations, blood pressure, adequacy of                        pulmonary ventilation, and response to care were                        monitored throughout the procedure. The physical status                        of the patient was re-assessed after the procedure.                       After obtaining informed consent, the colonoscope was                        passed under direct vision. Throughout the procedure,                        the patient's blood pressure, pulse, and oxygen                        saturations were monitored continuously. The                        Colonoscope was introduced through the anus and                        advanced to the the cecum, identified by appendiceal  orifice and ileocecal valve. The colonoscopy was                        performed without difficulty. The patient tolerated the                        procedure well. The quality of the bowel preparation                        was evaluated using the BBPS [facility] Bowel Preparation                        Scale) with scores of: Right Colon = 3, Transverse                        Colon = 3 and Left Colon = 3 (entire mucosa seen well                        with no residual staining, small fragments of stool  or                        opaque liquid). The total BBPS score equals 9. Findings:      The perianal and digital rectal examinations were normal. Pertinent       negatives include normal sphincter tone and no palpable rectal lesions.      A 5 mm polyp was found in the cecum. The polyp was sessile. The polyp       was removed with a cold snare. Resection and retrieval were complete.      Two sessile polyps were found in the transverse colon and ascending       colon. The polyps were diminutive in size. These polyps were removed       with a cold biopsy forceps. Resection and retrieval were complete.      The exam was otherwise without abnormality.      Unable to perform retroflexion in rectum as pt was not holding air Impression:           - One 5 mm polyp in the cecum, removed with a cold                        snare. Resected and retrieved.                       - Two diminutive polyps in the transverse colon and in                        the ascending colon, removed with a cold biopsy                        forceps. Resected and retrieved.                       - The examination was otherwise normal. Recommendation:       - Discharge patient to home (with escort).                       - Low sodium diet today.                       -  Continue present medications.                       - Await pathology results.                       - Repeat colonoscopy in 5-10 years for surveillance                        based on pathology results.                       - Return to my office as previously scheduled. Procedure Code(s):    --- Professional ---                       [phone], Colonoscopy, flexible; with removal of tumor(s),                        polyp(s), or other lesion(s) by snare technique                       [id number], 50, Colonoscopy, flexible; with biopsy, single                        or multiple Diagnosis Code(s):    --- Professional ---                       Z12.11, Encounter for  screening for malignant neoplasm                        of colon                       D12.0, Benign neoplasm of cecum                       D12.3, Benign neoplasm of transverse colon (hepatic                        flexure or splenic flexure)                       D12.2, Benign neoplasm of ascending colon CPT copyright 2017 American Medical Association. All rights reserved. The codes documented in this report are preliminary and upon coder review may  be revised to meet current compliance requirements. Dr. Ulyess Mort Lin Landsman MD, MD 02/03/2018 11:59:12 AM This report has been signed electronically. Number of Addenda: 0 Note Initiated On: 02/03/2018 11:20 AM Scope Withdrawal Time: 0 hours 15 minutes 34 seconds  Total Procedure Duration: 0 hours 18 minutes 56 seconds       [facility]

## 2018-02-03 NOTE — Anesthesia Postprocedure Evaluation (Signed)
Anesthesia Post Note  Patient: [name]  Procedure(s) Performed: ESOPHAGOGASTRODUODENOSCOPY (EGD) WITH PROPOFOL (N/A ) COLONOSCOPY WITH PROPOFOL (N/A )  Patient location during evaluation: Endoscopy Anesthesia Type: General Level of consciousness: awake and alert and oriented Pain management: satisfactory to patient Vital Signs Assessment: post-procedure vital signs reviewed and stable Respiratory status: spontaneous breathing and respiratory function stable Cardiovascular status: blood pressure returned to baseline and stable Postop Assessment: no headache, no backache, patient able to bend at knees, no apparent nausea or vomiting, adequate PO intake and able to ambulate Anesthetic complications: no     Last Vitals:  Vitals:   02/03/18 1201 02/03/18 1202  BP: 125/70   Pulse:    Resp:    Temp:  (!) 36 C  SpO2:      Last Pain:  Vitals:   02/03/18 0835  TempSrc: Tympanic  PainSc: 0-No pain                 Keirah Konitzer H Mikal Wisman

## 2018-02-03 NOTE — H&P (Signed)
[name], MD [address]  [place]  [place], [place] [address]  Main: [phone]  Fax: [phone] Pager: [phone]  Primary Care Physician:  [name], PA-C Primary Gastroenterologist:  Dr. [name]  Pre-Procedure History & Physical: HPI:  [name] is a 62 y.o. female is here for an endoscopy and colonoscopy.   Past Medical History:  Diagnosis Date  . Anemia   . Aortic stenosis    Echo 8/18: mean 13, peak 28, LVOT/AV mean velocity 0.51  . Arthritis   . Asthma    As a child   . Bronchitis   . CAD (coronary artery disease)    a. 09/2016: 50% Ost 1st Mrg stenosis, 50% 2nd Mrg stenosis, 20% Mid-Cx, 95% Prox LAD, 40% mid-LAD, and 10% dist-LAD stenosis. Staged PCI with DES to Prox-LAD.   [name] Chronic combined systolic and diastolic CHF (congestive heart failure) ([place]) 2011   echo 2/18: EF 55-60, normal wall motion, grade 2 diastolic dysfunction, trivial AI // echo 3/18: Septal and apical HK, EF 45-50, normal wall motion, trivial AI, mild LAE, PASP 38 // echo 8/18: EF 60-65, normal wall motion, grade 1 diastolic dysfunction, calcified aortic valve leaflets, mild aortic stenosis (mean 13, peak 28, LVOT/AV mean velocity 0.51), mild AI, moderate MAC, mild LAE, trivial TR   . Chronic kidney disease    STAGE 4  . Depression   . Diabetes mellitus Dx 1989  . GERD (gastroesophageal reflux disease)   . Gout   . Hepatitis C Dx 2013  . Hypertension Dx 1989  . Obesity   . Obstructive sleep apnea   . Pancreatitis 2013  . Refusal of blood transfusions as patient is Jehovah's Witness   . Tendinitis   . Tremors of nervous system    LEFT HAND  . Ulcer 2010    Past Surgical History:  Procedure Laterality Date  . APPLICATION OF WOUND VAC Left 08/14/2017   Procedure: APPLICATION OF WOUND VAC Exchange;  Surgeon: Robert Bellow, MD;  Location: [facility] ORS;  Service: General;  Laterality: Left;  . CHOLECYSTECTOMY    . CORONARY ANGIOPLASTY     STENT  .  CORONARY STENT INTERVENTION N/A 09/18/2016   Procedure: Coronary Stent Intervention;  Surgeon: Troy Sine, MD;  Location: [place] [facility];  Service: Cardiovascular;  Laterality: N/A;  . DRESSING CHANGE UNDER ANESTHESIA Left 08/15/2017   Procedure: exploration of wound for bleeding;  Surgeon: Robert Bellow, MD;  Location: [facility] ORS;  Service: General;  Laterality: Left;  . EYE SURGERY    . INCISION AND DRAINAGE ABSCESS Left 08/12/2017   Procedure: INCISION AND DRAINAGE ABSCESS;  Surgeon: Robert Bellow, MD;  Location: [facility] ORS;  Service: General;  Laterality: Left;  . KNEE ARTHROSCOPY    . LEFT HEART CATH N/A 09/18/2016   Procedure: Left Heart Cath;  Surgeon: Troy Sine, MD;  Location: [place] [facility];  Service: Cardiovascular;  Laterality: N/A;  . LEFT HEART CATH AND CORONARY ANGIOGRAPHY N/A 09/16/2016   Procedure: Left Heart Cath and Coronary Angiography;  Surgeon: Burnell Blanks, MD;  Location: [place] [facility];  Service: Cardiovascular;  Laterality: N/A;  . LEFT HEART CATH AND CORONARY ANGIOGRAPHY N/A 04/29/2017   Procedure: LEFT HEART CATH AND CORONARY ANGIOGRAPHY;  Surgeon: Nelva Bush, MD;  Location: [place] [facility];  Service: Cardiovascular;  Laterality: N/A;  . TUBAL LIGATION    . TUBAL LIGATION      Prior to Admission  medications   Medication Sig Start Date End Date Taking? Authorizing Provider  albuterol (PROVENTIL) (2.5 MG/3ML) 0.083% nebulizer solution Take 3 mLs (2.5 mg total) by nebulization every 6 (six) hours as needed for wheezing or shortness of breath. 09/01/17  Yes [name], PA-C  albuterol (VENTOLIN HFA) 108 (90 Base) MCG/ACT inhaler Inhale 2 puffs into the lungs every 4 (four) hours as needed for wheezing or shortness of breath. 09/01/17  Yes [name] M, PA-C  allopurinol (ZYLOPRIM) 100 MG tablet TAKE (2) TABLETS BY MOUTH ONCE DAILY. 01/06/18  Yes [name], PA-C  aspirin EC 81 MG EC tablet Take 1 tablet (81 mg  total) by mouth daily. 09/19/16  Yes Strader, [place] M, PA-C  atorvastatin (LIPITOR) 80 MG tablet Take 1 tablet (80 mg total) by mouth daily at 6 PM. Patient taking differently: Take 80 mg by mouth every morning.  09/24/17  Yes [name], PA-C  Blood Glucose Monitoring Suppl (ACCU-CHEK AVIVA) device Use as instructed daily. 04/14/17  Yes Charlott Rakes, MD  Blood Pressure Monitoring (ADULT BLOOD PRESSURE CUFF LG) KIT Check blood pressure once daily 10/14/17  Yes Burnette, Jennifer M, PA-C  carvedilol (COREG) 25 MG tablet TAKE 1 TABLET BY MOUTH 2 TIMES DAILY WITH A MEAL. Patient taking differently: Take 25 mg by mouth 2 (two) times daily with a meal.  10/14/17  Yes Burnette, Clearnce Sorrel, PA-C  Continuous Blood Gluc Receiver (FREESTYLE LIBRE 14 DAY READER) DEVI 1 each by Does not apply route every 14 (fourteen) days. 12/29/17  Yes [name], PA-C  docusate sodium (COLACE) 100 MG capsule Take 1 capsule (100 mg total) by mouth 2 (two) times daily as needed for mild constipation. 09/20/17  Yes Gouru, Aruna, MD  Dulaglutide (TRULICITY) 3.22 GU/5.4YH SOPN Inject 0.75 mg into the skin once a week. Patient taking differently: Inject 0.75 mg into the skin once a week. Thurs 11/22/17  Yes [name], PA-C  FEROSUL 325 (65 Fe) MG tablet TAKE (1) TABLET BY MOUTH EVERY OTHER DAY. 01/06/18  Yes Burnette, Clearnce Sorrel, PA-C  fluticasone (FLONASE) 50 MCG/ACT nasal spray Place 2 sprays into both nostrils daily as needed for allergies or rhinitis. 09/28/17  Yes [name], PA-C  Fluticasone-Salmeterol (ADVAIR DISKUS) 250-50 MCG/DOSE AEPB Inhale 1 puff into the lungs 2 (two) times daily. 09/01/17  Yes [name] M, PA-C  glucose blood (ACCU-CHEK AVIVA) test strip Use as instructed daily 04/14/17  Yes Newlin, Charlane Ferretti, MD  glucose blood (TRUE METRIX BLOOD GLUCOSE TEST) test strip Use as instructed 03/19/17  Yes Barrett, Rhonda G, PA-C  hydrALAZINE (APRESOLINE) 100 MG tablet Take 1 tablet  (100 mg total) by mouth 3 (three) times daily. 01/10/18  Yes [name], PA-C  hydrOXYzine (ATARAX/VISTARIL) 25 MG tablet Take 1 tablet (25 mg total) by mouth 2 (two) times daily as needed. 01/10/18  Yes [name], PA-C  insulin aspart (NOVOLOG) 100 UNIT/ML FlexPen Inject 10 Units into the skin 3 (three) times daily with meals. Patient taking differently: Inject 25 Units into the skin 2 (two) times daily.  11/15/17  Yes [name] M, PA-C  Insulin Glargine (LANTUS SOLOSTAR) 100 UNIT/ML Solostar Pen Inject 60 Units into the skin 2 (two) times daily. 12/28/17  Yes Florencia Reasons, MD  Insulin Pen Needle (PEN NEEDLES) 30G X 8 MM MISC 1 each by Does not apply route 3 (three) times daily before meals. AC for novolog injections 11/15/17  Yes [name], PA-C  isosorbide  mononitrate (IMDUR) 30 MG 24 hr tablet TAKE 3 TABLETS(90 MG) BY MOUTH DAILY 10/29/17  Yes [name], PA-C  Lancet Devices (ACCU-CHEK [place]) lancets Use as instructed daily. 04/14/17  Yes Charlott Rakes, MD  loratadine (CLARITIN) 10 MG tablet Take 1 tablet (10 mg total) by mouth daily. 09/28/17  Yes [name], PA-C  metolazone (ZAROXOLYN) 2.5 MG tablet Take 1 tablet (2.5 mg total) by mouth daily as needed (swelling). 01/27/18  Yes [name] M, PA-C  montelukast (SINGULAIR) 10 MG tablet Take 1 tablet (10 mg total) by mouth at bedtime. 09/28/17  Yes [name], PA-C  nitroGLYCERIN (NITROSTAT) 0.4 MG SL tablet Place 1 tablet (0.4 mg total) under the tongue every 5 (five) minutes x 3 doses as needed for chest pain. 03/19/17  Yes Barrett, Evelene Croon, PA-C  omeprazole (PRILOSEC) 20 MG capsule Take 1 capsule (20 mg total) by mouth daily. 09/24/17  Yes [name] M, PA-C  potassium chloride SA (K-DUR,KLOR-CON) 20 MEQ tablet Take 1 tablet (20 mEq total) by mouth 2 (two) times daily. 11/17/17  Yes [name], PA-C  pregabalin (LYRICA) 75 MG capsule Take 1 capsule (75 mg total) by  mouth 2 (two) times daily. 01/10/18  Yes [name], PA-C  SENNA PLUS 8.6-50 MG tablet  01/10/18  Yes [provider]  ticagrelor (BRILINTA) 90 MG TABS tablet Take 1 tablet (90 mg total) by mouth 2 (two) times daily. 10/14/17  Yes [name], PA-C  torsemide (DEMADEX) 20 MG tablet TAKE (2) TABLETS BY MOUTH TWICE DAILY. 12/17/17  Yes [name], PA-C  traMADol (ULTRAM) 50 MG tablet Take 1 tablet (50 mg total) by mouth every 8 (eight) hours as needed. 01/27/18  Yes [name], PA-C  triamcinolone cream (KENALOG) 0.1 % Apply 1 application topically 2 (two) times daily. 01/21/18  Yes Burnette, Anderson Malta M, PA-C  valACYclovir (VALTREX) 500 MG tablet TAKE (1) TABLET BY MOUTH EVERY OTHER DAY. 01/06/18  Yes Burnette, Clearnce Sorrel, PA-C  Vitamin D, Ergocalciferol, (DRISDOL) 50000 units CAPS capsule TAKE 1 CAPSULE BY MOUTH ONCE A MONTH 12/08/17  Yes [name] M, PA-C  gabapentin (NEURONTIN) 100 MG capsule Take 1 capsule (100 mg total) by mouth 3 (three) times daily. 08/19/17 09/01/17  [name], MD    Allergies as of 12/14/2017 - Review Complete 12/14/2017  Allergen Reaction Noted  . Shellfish allergy Anaphylaxis and Swelling 05/14/2012  . Diazepam Other (See Comments) 10/07/2015  . Morphine and related Itching 05/14/2012    Family History  Problem Relation Age of Onset  . Colon cancer Mother   . Heart attack Other   . Heart attack Maternal Grandmother   . Hypertension Sister   . Hypertension Brother   . Diabetes Paternal Grandmother   . Breast cancer Neg Hx     Social History   Socioeconomic History  . Marital status: Divorced    Spouse name: Not on file  . Number of children: Not on file  . Years of education: Not on file  . Highest education level: Not on file  Occupational History  . Not on file  Social Needs  . Financial resource strain: Not on file  . Food insecurity:    Worry: Not on file    Inability: Not on file  . Transportation  needs:    Medical: Not on file    Non-medical: Not on file  Tobacco Use  . Smoking status: Former Smoker    Last attempt to  quit: 10/25/1980    Years since quitting: 37.3  . Smokeless tobacco: Never Used  . Tobacco comment: quit smoking in 1982  Substance and Sexual Activity  . Alcohol use: Yes    Comment: 3 times in last year  . Drug use: No    Types: "Crack" cocaine    Comment: 08/21/2016 "clean since 05/1998"  . Sexual activity: Not on file    Comment: Not asked  Lifestyle  . Physical activity:    Days per week: Not on file    Minutes per session: Not on file  . Stress: Not on file  Relationships  . Social connections:    Talks on phone: Not on file    Gets together: Not on file    Attends religious service: Not on file    Active member of club or organization: Not on file    Attends meetings of clubs or organizations: Not on file    Relationship status: Not on file  . Intimate partner violence:    Fear of current or ex partner: Not on file    Emotionally abused: Not on file    Physically abused: Not on file    Forced sexual activity: Not on file  Other Topics Concern  . Not on file  Social History Narrative  . Not on file    Review of Systems: See HPI, otherwise negative ROS  Physical Exam: BP (!) 152/74   Pulse 79   Temp (!) 96.4 F (35.8 C) (Tympanic)   Resp (!) 22   Ht 5' 8"  (1.727 m)   Wt 296 lb (134.3 kg)   SpO2 98%   BMI 45.01 kg/m  General:   Alert,  pleasant and cooperative in NAD Head:  Normocephalic and atraumatic. Neck:  Supple; no masses or thyromegaly. Lungs:  Clear throughout to auscultation.    Heart:  Regular rate and rhythm. Abdomen:  Soft, nontender and nondistended. Normal bowel sounds, without guarding, and without rebound.   Neurologic:  Alert and  oriented x4;  grossly normal neurologically.  Impression/Plan: [name] is here for an endoscopy and colonoscopy to be performed for variceal screening and colon cancer  screening  Risks, benefits, limitations, and alternatives regarding  endoscopy and colonoscopy have been reviewed with the patient.  Questions have been answered.  All parties agreeable.   Sherri Sear, MD  02/03/2018, 8:51 AM

## 2018-02-04 LAB — SURGICAL PATHOLOGY

## 2018-02-04 NOTE — Telephone Encounter (Signed)
Pt stated that her eye doctors phone number is [phone]. Thanks TNP

## 2018-02-05 ENCOUNTER — Encounter: Payer: Self-pay | Admitting: Gastroenterology

## 2018-02-07 ENCOUNTER — Encounter: Payer: Self-pay | Admitting: Gastroenterology

## 2018-02-08 ENCOUNTER — Other Ambulatory Visit: Payer: Self-pay | Admitting: Physician Assistant

## 2018-02-08 DIAGNOSIS — J9601 Acute respiratory failure with hypoxia: Secondary | ICD-10-CM

## 2018-02-08 DIAGNOSIS — J301 Allergic rhinitis due to pollen: Secondary | ICD-10-CM

## 2018-02-08 NOTE — Telephone Encounter (Signed)
So we are going to be shooting to try to get your A1c closest to 8.0-9.0 before proceeding with surgery. Keep working on dietary habits and keep taking medications as prescribed.

## 2018-02-09 NOTE — Telephone Encounter (Signed)
PA sent

## 2018-02-09 NOTE — Telephone Encounter (Signed)
LMTCB  Thanks,  -Dylynn Ketner 

## 2018-02-10 ENCOUNTER — Other Ambulatory Visit (HOSPITAL_COMMUNITY): Payer: Self-pay

## 2018-02-10 NOTE — Progress Notes (Signed)
Paramedicine Encounter    Patient ID: [name], female    DOB: [birth year], 62 y.o.   MRN: [id number]   Patient Care Team: Rubye Beach as PCP - General (Family Medicine) Sherren Mocha, MD as PCP - Cardiology (Cardiology)  Patient Active Problem List   Diagnosis Date Noted  . Colon cancer screening   . LGSIL on Pap smear of cervix 01/27/2018  . Gout 12/25/2017  . AKI (acute kidney injury) ([place]) 12/24/2017  . Hyperglycemia 12/24/2017  . Chronic diastolic heart failure ([place]) 09/30/2017  . Lymphedema 09/30/2017  . Aortic stenosis   . Chronic pain of right knee 02/12/2017  . Chronic gout due to renal impairment of multiple sites without tophus 02/12/2017  . Esophageal dysphagia 02/12/2017  . Osteoarthritis 01/22/2017  . Diabetic hyperosmolar non-ketotic state ([place]) 12/13/2016  . CAD (coronary artery disease) 12/13/2016  . Hyperlipidemia 12/13/2016  . Hyperphosphatemia 12/13/2016  . Asthma 11/29/2015  . Depression 09/05/2015  . GERD (gastroesophageal reflux disease) 03/25/2015  . Environmental allergies 03/14/2015  . Chronic hepatitis C without hepatic coma ([place]) 01/31/2015  . Diabetic neuropathy ([place]) 01/31/2015  . OSA (obstructive sleep apnea) 01/01/2015  . Poor dentition 11/13/2014  . Essential hypertension 10/08/2014  . Obesity 10/08/2014  . CKD (chronic kidney disease) stage 4, GFR 15-29 ml/min ([facility]) 10/08/2014  . Diabetes mellitus type 2, uncontrolled, with complications ([place]) 40/81/4481    Class: Chronic  . Anemia in stage 3 chronic kidney disease ([place]) 03/25/2012    Current Outpatient Medications:  .  albuterol (PROVENTIL) (2.5 MG/3ML) 0.083% nebulizer solution, Take 3 mLs (2.5 mg total) by nebulization every 6 (six) hours as needed for wheezing or shortness of breath., Disp: 150 mL, Rfl: 1 .  albuterol (VENTOLIN HFA) 108 (90 Base) MCG/ACT inhaler, Inhale 2 puffs into the lungs every 4 (four) hours as needed for wheezing or shortness of breath.,  Disp: 54 g, Rfl: 3 .  allopurinol (ZYLOPRIM) 100 MG tablet, TAKE (2) TABLETS BY MOUTH ONCE DAILY., Disp: 180 tablet, Rfl: 1 .  aspirin EC 81 MG EC tablet, Take 1 tablet (81 mg total) by mouth daily., Disp: , Rfl:  .  atorvastatin (LIPITOR) 80 MG tablet, Take 1 tablet (80 mg total) by mouth daily at 6 PM. (Patient taking differently: Take 80 mg by mouth every morning. ), Disp: 90 tablet, Rfl: 3 .  Blood Glucose Monitoring Suppl (ACCU-CHEK AVIVA) device, Use as instructed daily., Disp: 1 each, Rfl: 0 .  Blood Pressure Monitoring (ADULT BLOOD PRESSURE CUFF LG) KIT, Check blood pressure once daily, Disp: 1 each, Rfl: 0 .  carvedilol (COREG) 25 MG tablet, TAKE 1 TABLET BY MOUTH 2 TIMES DAILY WITH A MEAL. (Patient taking differently: Take 25 mg by mouth 2 (two) times daily with a meal. ), Disp: 180 tablet, Rfl: 3 .  Continuous Blood Gluc Receiver (FREESTYLE LIBRE 14 DAY READER) DEVI, 1 each by Does not apply route every 14 (fourteen) days., Disp: 1 Device, Rfl: 24 .  Dulaglutide (TRULICITY) 8.56 DJ/4.9FW SOPN, Inject 0.75 mg into the skin once a week. (Patient taking differently: Inject 0.75 mg into the skin once a week. Thurs), Disp: 4 pen, Rfl: 3 .  FEROSUL 325 (65 Fe) MG tablet, TAKE (1) TABLET BY MOUTH EVERY OTHER DAY., Disp: 45 tablet, Rfl: 1 .  fluticasone (FLONASE) 50 MCG/ACT nasal spray, Place 2 sprays into both nostrils daily as needed for allergies or rhinitis., Disp: 48 g, Rfl: 1 .  Fluticasone-Salmeterol (ADVAIR DISKUS) 250-50 MCG/DOSE AEPB, Inhale  1 puff into the lungs 2 (two) times daily., Disp: 180 each, Rfl: 3 .  glucose blood (ACCU-CHEK AVIVA) test strip, Use as instructed daily, Disp: 100 each, Rfl: 12 .  glucose blood (TRUE METRIX BLOOD GLUCOSE TEST) test strip, Use as instructed, Disp: 100 each, Rfl: 12 .  hydrALAZINE (APRESOLINE) 100 MG tablet, Take 1 tablet (100 mg total) by mouth 3 (three) times daily., Disp: 270 tablet, Rfl: 1 .  hydrOXYzine (ATARAX/VISTARIL) 25 MG tablet, Take 1  tablet (25 mg total) by mouth 2 (two) times daily as needed., Disp: 180 tablet, Rfl: 1 .  insulin aspart (NOVOLOG) 100 UNIT/ML FlexPen, Inject 10 Units into the skin 3 (three) times daily with meals. (Patient taking differently: Inject 25 Units into the skin 2 (two) times daily. ), Disp: 15 mL, Rfl: 3 .  Insulin Glargine (LANTUS SOLOSTAR) 100 UNIT/ML Solostar Pen, Inject 60 Units into the skin 2 (two) times daily., Disp: 24 mL, Rfl: 0 .  Insulin Pen Needle (PEN NEEDLES) 30G X 8 MM MISC, 1 each by Does not apply route 3 (three) times daily before meals. AC for novolog injections, Disp: 100 each, Rfl: 11 .  isosorbide mononitrate (IMDUR) 30 MG 24 hr tablet, TAKE 3 TABLETS(90 MG) BY MOUTH DAILY, Disp: 270 tablet, Rfl: 1 .  Lancet Devices (ACCU-CHEK SOFTCLIX) lancets, Use as instructed daily., Disp: 1 each, Rfl: 5 .  loratadine (CLARITIN) 10 MG tablet, Take 1 tablet (10 mg total) by mouth daily., Disp: 90 tablet, Rfl: 1 .  metolazone (ZAROXOLYN) 2.5 MG tablet, Take 1 tablet (2.5 mg total) by mouth daily as needed (swelling)., Disp: 90 tablet, Rfl: 1 .  montelukast (SINGULAIR) 10 MG tablet, TAKE ONE TABLET BY MOUTH AT BEDTIME., Disp: 90 tablet, Rfl: 1 .  omeprazole (PRILOSEC) 20 MG capsule, Take 1 capsule (20 mg total) by mouth daily., Disp: 90 capsule, Rfl: 3 .  potassium chloride SA (K-DUR,KLOR-CON) 20 MEQ tablet, Take 1 tablet (20 mEq total) by mouth 2 (two) times daily., Disp: 60 tablet, Rfl: 5 .  pregabalin (LYRICA) 75 MG capsule, Take 1 capsule (75 mg total) by mouth 2 (two) times daily., Disp: 180 capsule, Rfl: 1 .  SENNA PLUS 8.6-50 MG tablet, , Disp: , Rfl: 5 .  ticagrelor (BRILINTA) 90 MG TABS tablet, Take 1 tablet (90 mg total) by mouth 2 (two) times daily., Disp: 180 tablet, Rfl: 3 .  torsemide (DEMADEX) 20 MG tablet, TAKE (2) TABLETS BY MOUTH TWICE DAILY., Disp: 120 tablet, Rfl: 5 .  traMADol (ULTRAM) 50 MG tablet, Take 1 tablet (50 mg total) by mouth every 8 (eight) hours as needed., Disp: 30  tablet, Rfl: 0 .  triamcinolone cream (KENALOG) 0.1 %, Apply 1 application topically 2 (two) times daily., Disp: 80 g, Rfl: 0 .  valACYclovir (VALTREX) 500 MG tablet, TAKE (1) TABLET BY MOUTH EVERY OTHER DAY., Disp: 45 tablet, Rfl: 1 .  Vitamin D, Ergocalciferol, (DRISDOL) 50000 units CAPS capsule, TAKE 1 CAPSULE BY MOUTH ONCE A MONTH, Disp: 3 capsule, Rfl: 3 .  docusate sodium (COLACE) 100 MG capsule, Take 1 capsule (100 mg total) by mouth 2 (two) times daily as needed for mild constipation. (Patient not taking: Reported on 02/10/2018), Disp: 30 capsule, Rfl: 0 .  nitroGLYCERIN (NITROSTAT) 0.4 MG SL tablet, Place 1 tablet (0.4 mg total) under the tongue every 5 (five) minutes x 3 doses as needed for chest pain. (Patient not taking: Reported on 02/10/2018), Disp: 25 tablet, Rfl: 12 Allergies  Allergen Reactions  .  Shellfish Allergy Anaphylaxis and Swelling  . Diazepam Other (See Comments)    "felt like out of body experience"  . Morphine And Related Itching      Social History   Socioeconomic History  . Marital status: Divorced    Spouse name: Not on file  . Number of children: Not on file  . Years of education: Not on file  . Highest education level: Not on file  Occupational History  . Not on file  Social Needs  . Financial resource strain: Not on file  . Food insecurity:    Worry: Not on file    Inability: Not on file  . Transportation needs:    Medical: Not on file    Non-medical: Not on file  Tobacco Use  . Smoking status: Former Smoker    Last attempt to quit: 10/25/1980    Years since quitting: 37.3  . Smokeless tobacco: Never Used  . Tobacco comment: quit smoking in 1982  Substance and Sexual Activity  . Alcohol use: Yes    Comment: 3 times in last year  . Drug use: No    Types: "Crack" cocaine    Comment: 08/21/2016 "clean since 05/1998"  . Sexual activity: Not on file    Comment: Not asked  Lifestyle  . Physical activity:    Days per week: Not on file    Minutes  per session: Not on file  . Stress: Not on file  Relationships  . Social connections:    Talks on phone: Not on file    Gets together: Not on file    Attends religious service: Not on file    Active member of club or organization: Not on file    Attends meetings of clubs or organizations: Not on file    Relationship status: Not on file  . Intimate partner violence:    Fear of current or ex partner: Not on file    Emotionally abused: Not on file    Physically abused: Not on file    Forced sexual activity: Not on file  Other Topics Concern  . Not on file  Social History Narrative  . Not on file    Physical Exam      Future Appointments  Date Time Provider [place]  03/01/2018  2:45 PM Robert Bellow, MD [facility] None  03/21/2018  4:00 PM Alisa Graff, FNP [facility] None    BP 134/68   Pulse 88   Resp 15   SpO2 98%   Weight last week-296 [facility]-[id number]   First visit with pt this morning, pt lives here with her brother and his son and she keeps his nephew.  Her brother is her primary support person.  She uses [employer]. She gets the bubble pack of meds and delivery.  meds verified.  She feels sob upon exertion, she can do most household chores but has to take her time or she gets too tired.  Checks [facility] most days.  Pt does not work, she has filed for disability and has been waiting for 3 yrs on it and recently obtained lawyer this year for representation.  Pt states she feels tired a lot, and she itches a lot.  Her sister takes her to appointments.  She doesn't weigh daily, she reports just getting the scales.  She states she eats horrible-cant afford foods that are better for her.  She may drink more fluid than her limit so that was reviewed along with better  food options.  She states she used to listen to music and dance some for exercise--I will look to see if I can get her a workout video like zumba videos or something to help.  Pt states she  has not been eating lately, yesterday she didn't eat until 7pm, she reports a lot of home stress, didn't elaborate as to what, so therefore she isnt eating she isnt taking her insulin. Her sister called while we was there to take her to get breakfast b/c she knew she hadnt been eating.   Marylouise Stacks, [facility] Paramedic  02/10/18

## 2018-02-10 NOTE — Telephone Encounter (Signed)
Patient advised as directed below. Patient scheduled a 3 month f/u with [name] to f/u Diabetes.

## 2018-02-15 ENCOUNTER — Telehealth: Payer: Self-pay

## 2018-02-15 NOTE — Telephone Encounter (Signed)
Have patient keep toe dry and clean. She can be scheduled for 8/8 with me or can call her podiatrist.

## 2018-02-15 NOTE — Telephone Encounter (Signed)
Patient called saying that she has a cut on her right toe and now it is turning dark purple. She does not know how long the cut has been there. She happened to look down and noticed that it is turning color. Patient only wanted to see you. However, you do not have any openings until Thursday (8/8). Should patient go to the ER for evaluation?   She denies any pain as patient has neuropathy in both feet. She denies any drainage, pus, or signs of infection. Contact info is correct. Thanks!

## 2018-02-15 NOTE — Telephone Encounter (Signed)
Patient advised as directed below.  Scheduled patient 08/08 at 8:20

## 2018-02-17 ENCOUNTER — Ambulatory Visit (INDEPENDENT_AMBULATORY_CARE_PROVIDER_SITE_OTHER): Payer: Medicaid Other | Admitting: Physician Assistant

## 2018-02-17 ENCOUNTER — Ambulatory Visit (INDEPENDENT_AMBULATORY_CARE_PROVIDER_SITE_OTHER): Payer: Medicaid Other | Admitting: Vascular Surgery

## 2018-02-17 ENCOUNTER — Encounter (INDEPENDENT_AMBULATORY_CARE_PROVIDER_SITE_OTHER): Payer: Self-pay | Admitting: Vascular Surgery

## 2018-02-17 ENCOUNTER — Ambulatory Visit
Admission: RE | Admit: 2018-02-17 | Discharge: 2018-02-17 | Disposition: A | Discharge status: Home / Self Care | Payer: Medicaid Other | Source: Ambulatory Visit | Attending: Physician Assistant | Admitting: Physician Assistant

## 2018-02-17 ENCOUNTER — Telehealth: Payer: Self-pay

## 2018-02-17 ENCOUNTER — Encounter: Payer: Self-pay | Admitting: Physician Assistant

## 2018-02-17 VITALS — BP 128/70 | HR 90 | Temp 98.4°F | Resp 16 | Wt 297.0 lb

## 2018-02-17 VITALS — BP 151/75 | HR 89 | Resp 19 | Ht 68.0 in | Wt 297.0 lb

## 2018-02-17 DIAGNOSIS — I998 Other disorder of circulatory system: Secondary | ICD-10-CM

## 2018-02-17 DIAGNOSIS — E1122 Type 2 diabetes mellitus with diabetic chronic kidney disease: Secondary | ICD-10-CM | POA: Diagnosis not present

## 2018-02-17 DIAGNOSIS — IMO0002 Reserved for concepts with insufficient information to code with codable children: Secondary | ICD-10-CM

## 2018-02-17 DIAGNOSIS — E1165 Type 2 diabetes mellitus with hyperglycemia: Secondary | ICD-10-CM

## 2018-02-17 DIAGNOSIS — N184 Chronic kidney disease, stage 4 (severe): Secondary | ICD-10-CM | POA: Diagnosis not present

## 2018-02-17 DIAGNOSIS — E118 Type 2 diabetes mellitus with unspecified complications: Secondary | ICD-10-CM

## 2018-02-17 DIAGNOSIS — I5032 Chronic diastolic (congestive) heart failure: Secondary | ICD-10-CM | POA: Insufficient documentation

## 2018-02-17 DIAGNOSIS — L089 Local infection of the skin and subcutaneous tissue, unspecified: Secondary | ICD-10-CM

## 2018-02-17 DIAGNOSIS — I739 Peripheral vascular disease, unspecified: Secondary | ICD-10-CM | POA: Diagnosis not present

## 2018-02-17 DIAGNOSIS — E785 Hyperlipidemia, unspecified: Secondary | ICD-10-CM

## 2018-02-17 DIAGNOSIS — I10 Essential (primary) hypertension: Secondary | ICD-10-CM | POA: Diagnosis not present

## 2018-02-17 DIAGNOSIS — E1152 Type 2 diabetes mellitus with diabetic peripheral angiopathy with gangrene: Secondary | ICD-10-CM

## 2018-02-17 DIAGNOSIS — B351 Tinea unguium: Secondary | ICD-10-CM

## 2018-02-17 MED ORDER — TRAMADOL HCL 50 MG PO TABS: ORAL_TABLET | ORAL | 0 refills | Status: DC

## 2018-02-17 MED ORDER — TRAMADOL HCL 50 MG PO TABS: 50.0000 mg | ORAL_TABLET | Freq: Four times a day (QID) | ORAL | 0 refills | Status: DC | PRN

## 2018-02-17 MED ORDER — SULFAMETHOXAZOLE-TRIMETHOPRIM 800-160 MG PO TABS: 1.0000 | ORAL_TABLET | Freq: Two times a day (BID) | ORAL | 0 refills | Status: DC

## 2018-02-17 MED ORDER — SILVER SULFADIAZINE 1 % EX CREA
1.0000 | TOPICAL_CREAM | Freq: Every day | CUTANEOUS | 0 refills | Status: DC
Start: 2018-02-17 — End: 2018-05-09

## 2018-02-17 NOTE — Telephone Encounter (Signed)
LMTCB  Thanks,  -Joseline 

## 2018-02-17 NOTE — Progress Notes (Signed)
Subjective:    Patient ID: [name], female    DOB: [birth year], 62 y.o.   MRN: [id number] Chief Complaint  Patient presents with  . New Patient (Initial Visit)    Ischemic Great toe   Presents as a new patient referred by Lillia Abed, PA-C for emergent evaluation of an possible ischemic toe.  The patient endorses a history of undergoing the removal of the toenail on her right first toe.  The patient states this occurred approximately 3 weeks ago.  The patient has started to experience progressively worsening pain to the big toe, a discoloration to the medial aspect of the toe and the nail is not healing.  The patient noted she tried to call the podiatrist for a follow-up and the office told her to follow-up with her primary care.  When she was seen by her primary care physician assistant she was referred over to our office to rule out peripheral artery disease / toe ischemia.  The patient denies any fever, nausea vomiting.  The patient denies any erythema to the toe.  Patient denies any discharge to the toe.  The patient does endorse claudication-like symptoms to the buttock thigh and calf with ambulation.  The patient denies any rest pain or ulcer formation to the bilateral lower extremity.  The patient notes that an antibiotic was called into her pharmacy by her primary care physician assistant.  Patient had dopplerable dorsalis pedis pulses on exam today.  The patient's foot was warm.  There is no discoloration to the big toe.  Review of Systems  Constitutional: Negative.   HENT: Negative.   Eyes: Negative.   Respiratory: Negative.   Cardiovascular:       Claudication-like symptoms Painful right big toe  Gastrointestinal: Negative.   Endocrine: Negative.   Genitourinary: Negative.   Musculoskeletal: Negative.   Skin: Negative.   Allergic/Immunologic: Negative.   Neurological: Negative.   Hematological: Negative.   Psychiatric/Behavioral: Negative.       Objective:   Physical Exam  Constitutional: She is oriented to person, place, and time. She appears well-developed and well-nourished. No distress.  HENT:  Head: Normocephalic and atraumatic.  Right Ear: External ear normal.  Left Ear: External ear normal.  Eyes: Pupils are equal, round, and reactive to light. Conjunctivae and EOM are normal.  Neck: Normal range of motion.  Cardiovascular: Normal rate, regular rhythm, normal heart sounds and intact distal pulses.  Pulses:      Radial pulses are 2+ on the right side, and 2+ on the left side.  Hard to palpate pedal pulses bilaterally however there is a dopplerable DP pulse to the right lower extremity.  The foot is warm.  There is no discoloration to the right big toe with the exception of a brownish discoloration to the medial aspect.  No erythema or discharge noted to the toe.  No signs of ischemia noted.  Pulmonary/Chest: Effort normal and breath sounds normal.  Musculoskeletal: Normal range of motion. She exhibits edema (Plus pitting edema noted to the bilateral lower extremity).  Neurological: She is alert and oriented to person, place, and time.  Skin: Skin is warm and dry. She is not diaphoretic.  Psychiatric: She has a normal mood and affect. Her behavior is normal. Judgment and thought content normal.  Vitals reviewed.  BP (!) 151/75 (BP Location: Right Arm, Patient Position: Sitting)   Pulse 89   Resp 19   Ht _0  (1.727 m)   Wt 297 lb (134.7 kg)  BMI 45.16 kg/m   Past Medical History:  Diagnosis Date  . Anemia   . Aortic stenosis    Echo 8/18: mean 13, peak 28, LVOT/AV mean velocity 0.51  . Arthritis   . Asthma    As a child   . Bronchitis   . CAD (coronary artery disease)    a. 09/2016: 50% Ost 1st Mrg stenosis, 50% 2nd Mrg stenosis, 20% Mid-Cx, 95% Prox LAD, 40% mid-LAD, and 10% dist-LAD stenosis. Staged PCI with DES to Prox-LAD.   [name] Chronic combined systolic and diastolic CHF (congestive heart failure) ([place]) 2011   echo 2/18:  EF 55-60, normal wall motion, grade 2 diastolic dysfunction, trivial AI // echo 3/18: Septal and apical HK, EF 45-50, normal wall motion, trivial AI, mild LAE, PASP 38 // echo 8/18: EF 60-65, normal wall motion, grade 1 diastolic dysfunction, calcified aortic valve leaflets, mild aortic stenosis (mean 13, peak 28, LVOT/AV mean velocity 0.51), mild AI, moderate MAC, mild LAE, trivial TR   . Chronic kidney disease    STAGE 4  . Depression   . Diabetes mellitus Dx 1989  . GERD (gastroesophageal reflux disease)   . Gout   . Hepatitis C Dx 2013  . Hypertension Dx 1989  . Obesity   . Obstructive sleep apnea   . Pancreatitis 2013  . Refusal of blood transfusions as patient is Jehovah's Witness   . Tendinitis   . Tremors of nervous system    LEFT HAND  . Ulcer 2010   Social History   Socioeconomic History  . Marital status: Divorced    Spouse name: Not on file  . Number of children: Not on file  . Years of education: Not on file  . Highest education level: Not on file  Occupational History  . Not on file  Social Needs  . Financial resource strain: Not on file  . Food insecurity:    Worry: Not on file    Inability: Not on file  . Transportation needs:    Medical: Not on file    Non-medical: Not on file  Tobacco Use  . Smoking status: Former Smoker    Last attempt to quit: 10/25/1980    Years since quitting: 37.3  . Smokeless tobacco: Never Used  . Tobacco comment: quit smoking in 1982  Substance and Sexual Activity  . Alcohol use: Yes    Comment: 3 times in last year  . Drug use: No    Types: "Crack" cocaine    Comment: 08/21/2016 "clean since 05/1998"  . Sexual activity: Not on file    Comment: Not asked  Lifestyle  . Physical activity:    Days per week: Not on file    Minutes per session: Not on file  . Stress: Not on file  Relationships  . Social connections:    Talks on phone: Not on file    Gets together: Not on file    Attends religious service: Not on file     Active member of club or organization: Not on file    Attends meetings of clubs or organizations: Not on file    Relationship status: Not on file  . Intimate partner violence:    Fear of current or ex partner: Not on file    Emotionally abused: Not on file    Physically abused: Not on file    Forced sexual activity: Not on file  Other Topics Concern  . Not on file  Social History Narrative  . Not  on file   Past Surgical History:  Procedure Laterality Date  . APPLICATION OF WOUND VAC Left 08/14/2017   Procedure: APPLICATION OF WOUND VAC Exchange;  Surgeon: Robert Bellow, MD;  Location: [facility] ORS;  Service: General;  Laterality: Left;  . CHOLECYSTECTOMY    . COLONOSCOPY WITH PROPOFOL N/A 02/03/2018   Procedure: COLONOSCOPY WITH PROPOFOL;  Surgeon: Lin Landsman, MD;  Location: [facility];  Service: Gastroenterology;  Laterality: N/A;  . CORONARY ANGIOPLASTY     STENT  . CORONARY STENT INTERVENTION N/A 09/18/2016   Procedure: Coronary Stent Intervention;  Surgeon: Troy Sine, MD;  Location: [name] [facility];  Service: Cardiovascular;  Laterality: N/A;  . DRESSING CHANGE UNDER ANESTHESIA Left 08/15/2017   Procedure: exploration of wound for bleeding;  Surgeon: Robert Bellow, MD;  Location: [facility] ORS;  Service: General;  Laterality: Left;  . ESOPHAGOGASTRODUODENOSCOPY (EGD) WITH PROPOFOL N/A 02/03/2018   Procedure: ESOPHAGOGASTRODUODENOSCOPY (EGD) WITH PROPOFOL;  Surgeon: Lin Landsman, MD;  Location: [facility] ENDOSCOPY;  Service: Gastroenterology;  Laterality: N/A;  . EYE SURGERY    . INCISION AND DRAINAGE ABSCESS Left 08/12/2017   Procedure: INCISION AND DRAINAGE ABSCESS;  Surgeon: Robert Bellow, MD;  Location: [facility] ORS;  Service: General;  Laterality: Left;  . KNEE ARTHROSCOPY    . LEFT HEART CATH N/A 09/18/2016   Procedure: Left Heart Cath;  Surgeon: Troy Sine, MD;  Location: [place] [facility];  Service: Cardiovascular;  Laterality: N/A;  . LEFT HEART CATH  AND CORONARY ANGIOGRAPHY N/A 09/16/2016   Procedure: Left Heart Cath and Coronary Angiography;  Surgeon: Burnell Blanks, MD;  Location: [place] [facility];  Service: Cardiovascular;  Laterality: N/A;  . LEFT HEART CATH AND CORONARY ANGIOGRAPHY N/A 04/29/2017   Procedure: LEFT HEART CATH AND CORONARY ANGIOGRAPHY;  Surgeon: Nelva Bush, MD;  Location: [place] [facility];  Service: Cardiovascular;  Laterality: N/A;  . TUBAL LIGATION    . TUBAL LIGATION     Family History  Problem Relation Age of Onset  . Colon cancer Mother   . Heart attack Other   . Heart attack Maternal Grandmother   . Hypertension Sister   . Hypertension Brother   . Diabetes Paternal Grandmother   . Breast cancer Neg Hx    Allergies  Allergen Reactions  . Shellfish Allergy Anaphylaxis and Swelling  . Diazepam Other (See Comments)    "felt like out of body experience"  . Morphine And Related Itching      Assessment & Plan:  Presents as a new patient referred by Lillia Abed, PA-C for emergent evaluation of an possible ischemic toe.  The patient endorses a history of undergoing the removal of the toenail on her right first toe.  The patient states this occurred approximately 3 weeks ago.  The patient has started to experience progressively worsening pain to the big toe, a discoloration to the medial aspect of the toe and the nail is not healing.  The patient noted she tried to call the podiatrist for a follow-up and the office told her to follow-up with her primary care.  When she was seen by her primary care physician assistant she was referred over to our office to rule out peripheral artery disease / toe ischemia.  The patient denies any fever, nausea vomiting.  The patient denies any erythema to the toe.  Patient denies any discharge to the toe.  The patient does endorse claudication-like symptoms to the buttock thigh and calf with  ambulation.  The patient denies any rest pain or ulcer formation to the  bilateral lower extremity.  The patient notes that an antibiotic was called into her pharmacy by her primary care physician assistant.  Patient had dopplerable dorsalis pedis pulses on exam today.  The patient's foot was warm.  There is no discoloration to the big toe.  1. PAD (peripheral artery disease) ([place]) - New Patient with multiple risk factors for peripheral artery disease The patient endorses a history of claudication-like symptoms to the buttocks thigh and calf with ambulation The symptoms have progressively worsened The patient recently had her right first toenail removed which is slow to heal and has now has become progressively more painful and red over the last 3 weeks Recommend a right lower extremity angiogram with possible intervention to assess the patient's anatomy and contributing degree of peripheral artery disease Procedure, risks and benefits explained to the patient All questions answered Patient wishes to proceed Silvadene and tramadol called into the patient's pharmacy The patient's primary care physician assistant has prescribed her an antibiotic  2. Essential hypertension - Stable Encouraged good control as its slows the progression of atherosclerotic disease  3. Diabetes mellitus type 2, uncontrolled, with complications ([place]) - Stable Encouraged good control as its slows the progression of atherosclerotic disease  4. Hyperlipidemia, unspecified hyperlipidemia type - Stable Encouraged good control as its slows the progression of atherosclerotic disease  5. Onychomycosis - Stable They made a referral to a new podiatrist for the patient as she feels that her previous podiatrist was not providing her adequate care.  - traMADol (ULTRAM) 50 MG tablet; One to Two Tabs Every Six Hours As Needed For Pain  Dispense: 60 tablet; Refill: 0 - silvadene cream 1% 50 gram tube apply to area once daily - referral to podiatry  Current Outpatient Medications on File Prior to  Visit  Medication Sig Dispense Refill  . albuterol (PROVENTIL) (2.5 MG/3ML) 0.083% nebulizer solution Take 3 mLs (2.5 mg total) by nebulization every 6 (six) hours as needed for wheezing or shortness of breath. 150 mL 1  . albuterol (VENTOLIN HFA) 108 (90 Base) MCG/ACT inhaler Inhale 2 puffs into the lungs every 4 (four) hours as needed for wheezing or shortness of breath. 54 g 3  . allopurinol (ZYLOPRIM) 100 MG tablet TAKE (2) TABLETS BY MOUTH ONCE DAILY. 180 tablet 1  . aspirin EC 81 MG EC tablet Take 1 tablet (81 mg total) by mouth daily.    [name] atorvastatin (LIPITOR) 80 MG tablet Take 1 tablet (80 mg total) by mouth daily at 6 PM. (Patient taking differently: Take 80 mg by mouth every morning. ) 90 tablet 3  . Blood Glucose Monitoring Suppl (ACCU-CHEK AVIVA) device Use as instructed daily. 1 each 0  . Blood Pressure Monitoring (ADULT BLOOD PRESSURE CUFF LG) KIT Check blood pressure once daily 1 each 0  . carvedilol (COREG) 25 MG tablet TAKE 1 TABLET BY MOUTH 2 TIMES DAILY WITH A MEAL. (Patient taking differently: Take 25 mg by mouth 2 (two) times daily with a meal. ) 180 tablet 3  . Continuous Blood Gluc Receiver (FREESTYLE LIBRE 14 DAY READER) DEVI 1 each by Does not apply route every 14 (fourteen) days. 1 Device 24  . Difluprednate (DUREZOL) 0.05 % EMUL     . docusate sodium (COLACE) 100 MG capsule Take 1 capsule (100 mg total) by mouth 2 (two) times daily as needed for mild constipation. 30 capsule 0  . Dulaglutide (TRULICITY) 5.32 DJ/2.4QA  SOPN Inject 0.75 mg into the skin once a week. (Patient taking differently: Inject 0.75 mg into the skin once a week. Thurs) 4 pen 3  . FEROSUL 325 (65 Fe) MG tablet TAKE (1) TABLET BY MOUTH EVERY OTHER DAY. 45 tablet 1  . fluticasone (FLONASE) 50 MCG/ACT nasal spray Place 2 sprays into both nostrils daily as needed for allergies or rhinitis. 48 g 1  . Fluticasone-Salmeterol (ADVAIR DISKUS) 250-50 MCG/DOSE AEPB Inhale 1 puff into the lungs 2 (two) times  daily. 180 each 3  . glucose blood (ACCU-CHEK AVIVA) test strip Use as instructed daily 100 each 12  . glucose blood (TRUE METRIX BLOOD GLUCOSE TEST) test strip Use as instructed 100 each 12  . hydrALAZINE (APRESOLINE) 100 MG tablet Take 1 tablet (100 mg total) by mouth 3 (three) times daily. 270 tablet 1  . hydrOXYzine (ATARAX/VISTARIL) 25 MG tablet Take 1 tablet (25 mg total) by mouth 2 (two) times daily as needed. 180 tablet 1  . insulin aspart (NOVOLOG) 100 UNIT/ML FlexPen Inject 10 Units into the skin 3 (three) times daily with meals. (Patient taking differently: Inject 25 Units into the skin 2 (two) times daily. ) 15 mL 3  . Insulin Glargine (LANTUS SOLOSTAR) 100 UNIT/ML Solostar Pen Inject 60 Units into the skin 2 (two) times daily. 24 mL 0  . Insulin Pen Needle (PEN NEEDLES) 30G X 8 MM MISC 1 each by Does not apply route 3 (three) times daily before meals. AC for novolog injections 100 each 11  . isosorbide mononitrate (IMDUR) 30 MG 24 hr tablet TAKE 3 TABLETS(90 MG) BY MOUTH DAILY 270 tablet 1  . ketotifen (ZADITOR) 0.025 % ophthalmic solution Apply to eye.    Elmore Guise Devices (ACCU-CHEK SOFTCLIX) lancets Use as instructed daily. 1 each 5  . loratadine (CLARITIN) 10 MG tablet Take 1 tablet (10 mg total) by mouth daily. 90 tablet 1  . meclizine (ANTIVERT) 25 MG tablet Take by mouth.    . metolazone (ZAROXOLYN) 2.5 MG tablet Take 1 tablet (2.5 mg total) by mouth daily as needed (swelling). 90 tablet 1  . montelukast (SINGULAIR) 10 MG tablet TAKE ONE TABLET BY MOUTH AT BEDTIME. 90 tablet 1  . nitroGLYCERIN (NITROSTAT) 0.4 MG SL tablet Place 1 tablet (0.4 mg total) under the tongue every 5 (five) minutes x 3 doses as needed for chest pain. 25 tablet 12  . omeprazole (PRILOSEC) 20 MG capsule Take 1 capsule (20 mg total) by mouth daily. 90 capsule 3  . potassium chloride SA (K-DUR,KLOR-CON) 20 MEQ tablet Take 1 tablet (20 mEq total) by mouth 2 (two) times daily. 60 tablet 5  . pregabalin  (LYRICA) 75 MG capsule Take 1 capsule (75 mg total) by mouth 2 (two) times daily. 180 capsule 1  . SENNA PLUS 8.6-50 MG tablet   5  . ticagrelor (BRILINTA) 90 MG TABS tablet Take 1 tablet (90 mg total) by mouth 2 (two) times daily. 180 tablet 3  . torsemide (DEMADEX) 20 MG tablet TAKE (2) TABLETS BY MOUTH TWICE DAILY. 120 tablet 5  . triamcinolone cream (KENALOG) 0.1 % Apply 1 application topically 2 (two) times daily. 80 g 0  . valACYclovir (VALTREX) 500 MG tablet TAKE (1) TABLET BY MOUTH EVERY OTHER DAY. 45 tablet 1  . Vitamin D, Ergocalciferol, (DRISDOL) 50000 units CAPS capsule TAKE 1 CAPSULE BY MOUTH ONCE A MONTH 3 capsule 3  . [DISCONTINUED] gabapentin (NEURONTIN) 100 MG capsule Take 1 capsule (100 mg total) by mouth 3 (  three) times daily. 90 capsule 1   No current facility-administered medications on file prior to visit.    There are no Patient Instructions on file for this visit. No follow-ups on file.  Carsin Randazzo A Charmon Thorson, PA-C

## 2018-02-17 NOTE — Telephone Encounter (Signed)
-----   Message from [name], [place] sent at 02/17/2018 10:18 AM EDT ----- Odette Horns was unremarkable for the toe. No obvious osteomyelitis on xray noted.

## 2018-02-17 NOTE — Addendum Note (Signed)
Addended by: [name] on: 02/17/2018 11:32 AM   Modules accepted: Orders

## 2018-02-17 NOTE — Progress Notes (Signed)
Patient: [name] Female    DOB: February 27, 1956   62 y.o.   MRN: [id number] Visit Date: 02/17/2018  Today's Provider: [name], PA-C   Chief Complaint  Patient presents with  . Toe Pain   Subjective:    HPI Patient here today with c/o cut in her right great toe. She reports it hurts. She reports that she can't feel the cut and the color of it is "purple and blue". Treatment tried: Epsom salt soaks.  On 01/31/18 she had her right great toe nail removed by Dr. Paulla Dolly due to toe nail fungus. She reports she started developing pain and discoloration in her toe over a week ago. She has been using epsom salts to try to help the pain. Toe now has a discharge and foul odor.   She does have uncontrolled T2DM with most recent A1c of 10.2 (improving of recent). She also has CKD stage 4, chronic Hep C and CHF.     Allergies  Allergen Reactions  . Shellfish Allergy Anaphylaxis and Swelling  . Diazepam Other (See Comments)    "felt like out of body experience"  . Morphine And Related Itching     Current Outpatient Medications:  .  albuterol (PROVENTIL) (2.5 MG/3ML) 0.083% nebulizer solution, Take 3 mLs (2.5 mg total) by nebulization every 6 (six) hours as needed for wheezing or shortness of breath., Disp: 150 mL, Rfl: 1 .  albuterol (VENTOLIN HFA) 108 (90 Base) MCG/ACT inhaler, Inhale 2 puffs into the lungs every 4 (four) hours as needed for wheezing or shortness of breath., Disp: 54 g, Rfl: 3 .  allopurinol (ZYLOPRIM) 100 MG tablet, TAKE (2) TABLETS BY MOUTH ONCE DAILY., Disp: 180 tablet, Rfl: 1 .  aspirin EC 81 MG EC tablet, Take 1 tablet (81 mg total) by mouth daily., Disp: , Rfl:  .  atorvastatin (LIPITOR) 80 MG tablet, Take 1 tablet (80 mg total) by mouth daily at 6 PM. (Patient taking differently: Take 80 mg by mouth every morning. ), Disp: 90 tablet, Rfl: 3 .  Blood Glucose Monitoring Suppl (ACCU-CHEK AVIVA) device, Use as instructed daily., Disp: 1 each, Rfl: 0 .  Blood  Pressure Monitoring (ADULT BLOOD PRESSURE CUFF LG) KIT, Check blood pressure once daily, Disp: 1 each, Rfl: 0 .  carvedilol (COREG) 25 MG tablet, TAKE 1 TABLET BY MOUTH 2 TIMES DAILY WITH A MEAL. (Patient taking differently: Take 25 mg by mouth 2 (two) times daily with a meal. ), Disp: 180 tablet, Rfl: 3 .  Continuous Blood Gluc Receiver (FREESTYLE LIBRE 14 DAY READER) DEVI, 1 each by Does not apply route every 14 (fourteen) days., Disp: 1 Device, Rfl: 24 .  docusate sodium (COLACE) 100 MG capsule, Take 1 capsule (100 mg total) by mouth 2 (two) times daily as needed for mild constipation., Disp: 30 capsule, Rfl: 0 .  Dulaglutide (TRULICITY) 7.03 JK/0.9FG SOPN, Inject 0.75 mg into the skin once a week. (Patient taking differently: Inject 0.75 mg into the skin once a week. Thurs), Disp: 4 pen, Rfl: 3 .  FEROSUL 325 (65 Fe) MG tablet, TAKE (1) TABLET BY MOUTH EVERY OTHER DAY., Disp: 45 tablet, Rfl: 1 .  fluticasone (FLONASE) 50 MCG/ACT nasal spray, Place 2 sprays into both nostrils daily as needed for allergies or rhinitis., Disp: 48 g, Rfl: 1 .  Fluticasone-Salmeterol (ADVAIR DISKUS) 250-50 MCG/DOSE AEPB, Inhale 1 puff into the lungs 2 (two) times daily., Disp: 180 each, Rfl: 3 .  glucose  blood (ACCU-CHEK AVIVA) test strip, Use as instructed daily, Disp: 100 each, Rfl: 12 .  glucose blood (TRUE METRIX BLOOD GLUCOSE TEST) test strip, Use as instructed, Disp: 100 each, Rfl: 12 .  hydrALAZINE (APRESOLINE) 100 MG tablet, Take 1 tablet (100 mg total) by mouth 3 (three) times daily., Disp: 270 tablet, Rfl: 1 .  hydrOXYzine (ATARAX/VISTARIL) 25 MG tablet, Take 1 tablet (25 mg total) by mouth 2 (two) times daily as needed., Disp: 180 tablet, Rfl: 1 .  insulin aspart (NOVOLOG) 100 UNIT/ML FlexPen, Inject 10 Units into the skin 3 (three) times daily with meals. (Patient taking differently: Inject 25 Units into the skin 2 (two) times daily. ), Disp: 15 mL, Rfl: 3 .  Insulin Glargine (LANTUS SOLOSTAR) 100 UNIT/ML  Solostar Pen, Inject 60 Units into the skin 2 (two) times daily., Disp: 24 mL, Rfl: 0 .  Insulin Pen Needle (PEN NEEDLES) 30G X 8 MM MISC, 1 each by Does not apply route 3 (three) times daily before meals. AC for novolog injections, Disp: 100 each, Rfl: 11 .  isosorbide mononitrate (IMDUR) 30 MG 24 hr tablet, TAKE 3 TABLETS(90 MG) BY MOUTH DAILY, Disp: 270 tablet, Rfl: 1 .  Lancet Devices (ACCU-CHEK SOFTCLIX) lancets, Use as instructed daily., Disp: 1 each, Rfl: 5 .  loratadine (CLARITIN) 10 MG tablet, Take 1 tablet (10 mg total) by mouth daily., Disp: 90 tablet, Rfl: 1 .  metolazone (ZAROXOLYN) 2.5 MG tablet, Take 1 tablet (2.5 mg total) by mouth daily as needed (swelling)., Disp: 90 tablet, Rfl: 1 .  montelukast (SINGULAIR) 10 MG tablet, TAKE ONE TABLET BY MOUTH AT BEDTIME., Disp: 90 tablet, Rfl: 1 .  nitroGLYCERIN (NITROSTAT) 0.4 MG SL tablet, Place 1 tablet (0.4 mg total) under the tongue every 5 (five) minutes x 3 doses as needed for chest pain., Disp: 25 tablet, Rfl: 12 .  omeprazole (PRILOSEC) 20 MG capsule, Take 1 capsule (20 mg total) by mouth daily., Disp: 90 capsule, Rfl: 3 .  potassium chloride SA (K-DUR,KLOR-CON) 20 MEQ tablet, Take 1 tablet (20 mEq total) by mouth 2 (two) times daily., Disp: 60 tablet, Rfl: 5 .  pregabalin (LYRICA) 75 MG capsule, Take 1 capsule (75 mg total) by mouth 2 (two) times daily., Disp: 180 capsule, Rfl: 1 .  SENNA PLUS 8.6-50 MG tablet, , Disp: , Rfl: 5 .  ticagrelor (BRILINTA) 90 MG TABS tablet, Take 1 tablet (90 mg total) by mouth 2 (two) times daily., Disp: 180 tablet, Rfl: 3 .  torsemide (DEMADEX) 20 MG tablet, TAKE (2) TABLETS BY MOUTH TWICE DAILY., Disp: 120 tablet, Rfl: 5 .  traMADol (ULTRAM) 50 MG tablet, Take 1 tablet (50 mg total) by mouth every 8 (eight) hours as needed., Disp: 30 tablet, Rfl: 0 .  triamcinolone cream (KENALOG) 0.1 %, Apply 1 application topically 2 (two) times daily., Disp: 80 g, Rfl: 0 .  valACYclovir (VALTREX) 500 MG tablet, TAKE  (1) TABLET BY MOUTH EVERY OTHER DAY., Disp: 45 tablet, Rfl: 1 .  Vitamin D, Ergocalciferol, (DRISDOL) 50000 units CAPS capsule, TAKE 1 CAPSULE BY MOUTH ONCE A MONTH, Disp: 3 capsule, Rfl: 3  Review of Systems  Constitutional: Negative.   Respiratory: Negative.   Cardiovascular: Negative.   Musculoskeletal: Positive for arthralgias and gait problem.  Skin: Positive for color change and wound.    Social History   Tobacco Use  . Smoking status: Former Smoker    Last attempt to quit: 10/25/1980    Years since quitting: 37.3  . Smokeless  tobacco: Never Used  . Tobacco comment: quit smoking in 1982  Substance Use Topics  . Alcohol use: Yes    Comment: 3 times in last year   Objective:   BP 128/70 (BP Location: Left Wrist, Patient Position: Sitting, Cuff Size: Normal)   Pulse 90   Temp 98.4 F (36.9 C) (Oral)   Resp 16   Wt 297 lb (134.7 kg)   SpO2 96%   BMI 45.16 kg/m  Vitals:   02/17/18 0827  BP: 128/70  Pulse: 90  Resp: 16  Temp: 98.4 F (36.9 C)  TempSrc: Oral  SpO2: 96%  Weight: 297 lb (134.7 kg)     Physical Exam  Constitutional: She appears well-developed and well-nourished. No distress.  Neck: Normal range of motion. Neck supple.  Cardiovascular: Normal rate and regular rhythm. Exam reveals no gallop and no friction rub.  Murmur heard. Pulmonary/Chest: Effort normal and breath sounds normal. No respiratory distress. She has no wheezes. She has no rales.  Skin: Skin is warm. She is not diaphoretic.     Right great toe with wet gangrenous appearance to the nail bed with yellow sloughy tissue predominantly through the nail bed. There is a brownish darkening discoloration to the lateral great toe that is hard to palpate and tender. There is a foul, necrotic odor to the toe. Patient is also having pain out of proportion c/w rest pain. Foot is warm but pulses not palpated.  Vitals reviewed.       Assessment & Plan:     1. Diabetic wet gangrene of the foot  ([place]) Will get imaging as below to R/O osteomyelitis. Urgent referral placed to vascular clinic. Appt made for today at 10:30. I will f/u pending results. Vascular consult greatly appreciated.  - DG Toe Great Right; Future  2. Ischemia of toe See above medical treatment plan. - DG Toe Great Right; Future  3. Chronic diastolic heart failure ([place]) See above medical treatment plan. - DG Toe Great Right; Future  4. Diabetes mellitus type 2, uncontrolled, with complications ([place]) See above medical treatment plan. - DG Toe Great Right; Future  5. CKD (chronic kidney disease) stage 4, GFR 15-29 ml/min ([facility]) See above medical treatment plan. - DG Toe Great Right; Future         [name], PA-C  [facility]

## 2018-02-18 ENCOUNTER — Encounter (INDEPENDENT_AMBULATORY_CARE_PROVIDER_SITE_OTHER): Payer: Self-pay

## 2018-02-18 NOTE — Telephone Encounter (Signed)
Patient advised as below.  

## 2018-02-23 ENCOUNTER — Telehealth (HOSPITAL_COMMUNITY): Payer: Self-pay

## 2018-02-23 NOTE — Telephone Encounter (Signed)
Tried to call pt this week for a home visit, she did not answer the phone, I left VM for her to return my call.    Marylouise Stacks, EMT-Paramedic  02/23/18

## 2018-02-28 ENCOUNTER — Telehealth (HOSPITAL_COMMUNITY): Payer: Self-pay

## 2018-03-01 ENCOUNTER — Ambulatory Visit (INDEPENDENT_AMBULATORY_CARE_PROVIDER_SITE_OTHER): Payer: Medicaid Other | Admitting: General Surgery

## 2018-03-01 ENCOUNTER — Other Ambulatory Visit: Payer: Self-pay | Admitting: Physician Assistant

## 2018-03-01 VITALS — BP 140/80 | HR 87 | Resp 14 | Ht 68.0 in | Wt 297.0 lb

## 2018-03-01 DIAGNOSIS — E1165 Type 2 diabetes mellitus with hyperglycemia: Secondary | ICD-10-CM

## 2018-03-01 DIAGNOSIS — L02416 Cutaneous abscess of left lower limb: Secondary | ICD-10-CM

## 2018-03-01 DIAGNOSIS — IMO0002 Reserved for concepts with insufficient information to code with codable children: Secondary | ICD-10-CM

## 2018-03-01 DIAGNOSIS — E118 Type 2 diabetes mellitus with unspecified complications: Principal | ICD-10-CM

## 2018-03-01 NOTE — Progress Notes (Signed)
Patient ID: [name], female   DOB: 08-Mar-1956, 62 y.o.   MRN: [id number]  Chief Complaint  Patient presents with  . Routine Post Op    HPI [name] is a 62 y.o. female here today for his follow up hospital stay in February 2019 at which time she underwent I & D of a left thigh abscess. Patient states she is doing well.   Past Medical History:  Diagnosis Date  . Anemia   . Aortic stenosis    Echo 8/18: mean 13, peak 28, LVOT/AV mean velocity 0.51  . Arthritis   . Asthma    As a child   . Bronchitis   . CAD (coronary artery disease)    a. 09/2016: 50% Ost 1st Mrg stenosis, 50% 2nd Mrg stenosis, 20% Mid-Cx, 95% Prox LAD, 40% mid-LAD, and 10% dist-LAD stenosis. Staged PCI with DES to Prox-LAD.   [name] Chronic combined systolic and diastolic CHF (congestive heart failure) ([place]) 2011   echo 2/18: EF 55-60, normal wall motion, grade 2 diastolic dysfunction, trivial AI // echo 3/18: Septal and apical HK, EF 45-50, normal wall motion, trivial AI, mild LAE, PASP 38 // echo 8/18: EF 60-65, normal wall motion, grade 1 diastolic dysfunction, calcified aortic valve leaflets, mild aortic stenosis (mean 13, peak 28, LVOT/AV mean velocity 0.51), mild AI, moderate MAC, mild LAE, trivial TR   . Chronic kidney disease    STAGE 4  . Depression   . Diabetes mellitus Dx 1989  . GERD (gastroesophageal reflux disease)   . Gout   . Hepatitis C Dx 2013  . Hypertension Dx 1989  . Obesity   . Obstructive sleep apnea   . Pancreatitis 2013  . Refusal of blood transfusions as patient is Jehovah's Witness   . Tendinitis   . Tremors of nervous system    LEFT HAND  . Ulcer 2010    Past Surgical History:  Procedure Laterality Date  . APPLICATION OF WOUND VAC Left 08/14/2017   Procedure: APPLICATION OF WOUND VAC Exchange;  Surgeon: Robert Bellow, MD;  Location: [facility] ORS;  Service: General;  Laterality: Left;  . CHOLECYSTECTOMY    . COLONOSCOPY WITH PROPOFOL N/A 02/03/2018   Procedure: COLONOSCOPY  WITH PROPOFOL;  Surgeon: Lin Landsman, MD;  [facility];  Service: Gastroenterology;  Laterality: N/A;  . CORONARY ANGIOPLASTY     STENT  . CORONARY STENT INTERVENTION N/A 09/18/2016   Procedure: Coronary Stent Intervention;  Surgeon: Troy Sine, MD;  Location: [place] CV LAB;  Service: Cardiovascular;  Laterality: N/A;  . DRESSING CHANGE UNDER ANESTHESIA Left 08/15/2017   Procedure: exploration of wound for bleeding;  Surgeon: Robert Bellow, MD;  Location: [facility] ORS;  Service: General;  Laterality: Left;  . ESOPHAGOGASTRODUODENOSCOPY (EGD) WITH PROPOFOL N/A 02/03/2018   Procedure: ESOPHAGOGASTRODUODENOSCOPY (EGD) WITH PROPOFOL;  Surgeon: Lin Landsman, MD;  Location: [facility] ENDOSCOPY;  Service: Gastroenterology;  Laterality: N/A;  . EYE SURGERY    . INCISION AND DRAINAGE ABSCESS Left 08/12/2017   Procedure: INCISION AND DRAINAGE ABSCESS;  Surgeon: Robert Bellow, MD;  Location: [facility] ORS;  Service: General;  Laterality: Left;  . KNEE ARTHROSCOPY    . LEFT HEART CATH N/A 09/18/2016   Procedure: Left Heart Cath;  Surgeon: Troy Sine, MD;  Location: [place] CV LAB;  Service: Cardiovascular;  Laterality: N/A;  . LEFT HEART CATH AND CORONARY ANGIOGRAPHY N/A 09/16/2016   Procedure: Left Heart Cath and Coronary Angiography;  Surgeon: Harrell Gave  Santina Evans, MD;  Location: [place] CV LAB;  Service: Cardiovascular;  Laterality: N/A;  . LEFT HEART CATH AND CORONARY ANGIOGRAPHY N/A 04/29/2017   Procedure: LEFT HEART CATH AND CORONARY ANGIOGRAPHY;  Surgeon: Nelva Bush, MD;  Location: [place] CV LAB;  Service: Cardiovascular;  Laterality: N/A;  . TUBAL LIGATION    . TUBAL LIGATION      Family History  Problem Relation Age of Onset  . Colon cancer Mother   . Heart attack Other   . Heart attack Maternal Grandmother   . Hypertension Sister   . Hypertension Brother   . Diabetes Paternal Grandmother   . Breast cancer Neg Hx     Social  History Social History   Tobacco Use  . Smoking status: Former Smoker    Last attempt to quit: 10/25/1980    Years since quitting: 37.3  . Smokeless tobacco: Never Used  . Tobacco comment: quit smoking in 1982  Substance Use Topics  . Alcohol use: Yes    Comment: 3 times in last year  . Drug use: No    Types: "Crack" cocaine    Comment: 08/21/2016 "clean since 05/1998"    Allergies  Allergen Reactions  . Shellfish Allergy Anaphylaxis and Swelling  . Diazepam Other (See Comments)    "felt like out of body experience"  . Morphine And Related Itching    Current Outpatient Medications  Medication Sig Dispense Refill  . albuterol (PROVENTIL) (2.5 MG/3ML) 0.083% nebulizer solution Take 3 mLs (2.5 mg total) by nebulization every 6 (six) hours as needed for wheezing or shortness of breath. 150 mL 1  . albuterol (VENTOLIN HFA) 108 (90 Base) MCG/ACT inhaler Inhale 2 puffs into the lungs every 4 (four) hours as needed for wheezing or shortness of breath. 54 g 3  . allopurinol (ZYLOPRIM) 100 MG tablet TAKE (2) TABLETS BY MOUTH ONCE DAILY. (Patient taking differently: Take 200 mg by mouth daily. ) 180 tablet 1  . aspirin EC 81 MG EC tablet Take 1 tablet (81 mg total) by mouth daily.    [name] atorvastatin (LIPITOR) 80 MG tablet Take 1 tablet (80 mg total) by mouth daily at 6 PM. (Patient taking differently: Take 80 mg by mouth daily. ) 90 tablet 3  . Blood Glucose Monitoring Suppl (ACCU-CHEK AVIVA) device Use as instructed daily. 1 each 0  . Blood Pressure Monitoring (ADULT BLOOD PRESSURE CUFF LG) KIT Check blood pressure once daily 1 each 0  . carvedilol (COREG) 25 MG tablet TAKE 1 TABLET BY MOUTH 2 TIMES DAILY WITH A MEAL. (Patient taking differently: Take 25 mg by mouth 2 (two) times daily with a meal. ) 180 tablet 3  . Continuous Blood Gluc Receiver (FREESTYLE LIBRE 14 DAY READER) DEVI 1 each by Does not apply route every 14 (fourteen) days. 1 Device 24  . docusate sodium (COLACE) 100 MG capsule  Take 1 capsule (100 mg total) by mouth 2 (two) times daily as needed for mild constipation. 30 capsule 0  . Dulaglutide (TRULICITY) 3.54 TG/2.5WL SOPN Inject 0.75 mg into the skin once a week. (Patient taking differently: Inject 0.75 mg into the skin once a week. Thurs) 4 pen 3  . FEROSUL 325 (65 Fe) MG tablet TAKE (1) TABLET BY MOUTH EVERY OTHER DAY. (Patient not taking: Reported on 03/01/2018) 45 tablet 1  . fluticasone (FLONASE) 50 MCG/ACT nasal spray Place 2 sprays into both nostrils daily as needed for allergies or rhinitis. 48 g 1  . Fluticasone-Salmeterol (ADVAIR DISKUS)  250-50 MCG/DOSE AEPB Inhale 1 puff into the lungs 2 (two) times daily. 180 each 3  . glucose blood (ACCU-CHEK AVIVA) test strip Use as instructed daily 100 each 12  . glucose blood (TRUE METRIX BLOOD GLUCOSE TEST) test strip Use as instructed 100 each 12  . hydrALAZINE (APRESOLINE) 100 MG tablet Take 1 tablet (100 mg total) by mouth 3 (three) times daily. 270 tablet 1  . hydrOXYzine (ATARAX/VISTARIL) 25 MG tablet Take 1 tablet (25 mg total) by mouth 2 (two) times daily as needed. (Patient taking differently: Take 25 mg by mouth 2 (two) times daily as needed for itching. ) 180 tablet 1  . insulin aspart (NOVOLOG) 100 UNIT/ML FlexPen Inject 10 Units into the skin 3 (three) times daily with meals. (Patient taking differently: Inject 25 Units into the skin 3 (three) times daily with meals. ) 15 mL 3  . Insulin Pen Needle (PEN NEEDLES) 30G X 8 MM MISC 1 each by Does not apply route 3 (three) times daily before meals. AC for novolog injections 100 each 11  . isosorbide mononitrate (IMDUR) 30 MG 24 hr tablet TAKE 3 TABLETS(90 MG) BY MOUTH DAILY (Patient taking differently: Take 90 mg by mouth daily. ) 270 tablet 1  . ketotifen (ZADITOR) 0.025 % ophthalmic solution Place 1 drop into both eyes daily.     Elmore Guise Devices (ACCU-CHEK SOFTCLIX) lancets Use as instructed daily. 1 each 5  . loratadine (CLARITIN) 10 MG tablet Take 1 tablet (10  mg total) by mouth daily. 90 tablet 1  . metolazone (ZAROXOLYN) 2.5 MG tablet Take 1 tablet (2.5 mg total) by mouth daily as needed (swelling). 90 tablet 1  . montelukast (SINGULAIR) 10 MG tablet TAKE ONE TABLET BY MOUTH AT BEDTIME. (Patient taking differently: Take 10 mg by mouth at bedtime. ) 90 tablet 1  . nitroGLYCERIN (NITROSTAT) 0.4 MG SL tablet Place 1 tablet (0.4 mg total) under the tongue every 5 (five) minutes x 3 doses as needed for chest pain. 25 tablet 12  . omeprazole (PRILOSEC) 20 MG capsule Take 1 capsule (20 mg total) by mouth daily. 90 capsule 3  . potassium chloride SA (K-DUR,KLOR-CON) 20 MEQ tablet Take 1 tablet (20 mEq total) by mouth 2 (two) times daily. 60 tablet 5  . pregabalin (LYRICA) 75 MG capsule Take 1 capsule (75 mg total) by mouth 2 (two) times daily. 180 capsule 1  . SENNA PLUS 8.6-50 MG tablet Take 1 tablet by mouth daily as needed.   5  . silver sulfADIAZINE (SILVADENE) 1 % cream Apply 1 application topically daily. Apply to right first toe 50 g 0  . ticagrelor (BRILINTA) 90 MG TABS tablet Take 1 tablet (90 mg total) by mouth 2 (two) times daily. 180 tablet 3  . torsemide (DEMADEX) 20 MG tablet TAKE (2) TABLETS BY MOUTH TWICE DAILY. (Patient taking differently: Take 40 mg by mouth 2 (two) times daily. ) 120 tablet 5  . traMADol (ULTRAM) 50 MG tablet One to Two Tabs Every Six Hours As Needed For Pain (Patient taking differently: Take 50-100 mg by mouth every 6 (six) hours as needed for moderate pain. ) 60 tablet 0  . triamcinolone cream (KENALOG) 0.1 % Apply 1 application topically 2 (two) times daily. 80 g 0  . valACYclovir (VALTREX) 500 MG tablet TAKE (1) TABLET BY MOUTH EVERY OTHER DAY. (Patient taking differently: Take 500 mg by mouth every other day. ) 45 tablet 1  . Vitamin D, Ergocalciferol, (DRISDOL) 50000 units CAPS capsule  TAKE 1 CAPSULE BY MOUTH ONCE A MONTH (Patient taking differently: Take 50,000 Units by mouth every 30 (thirty) days. ) 3 capsule 3  .  LANTUS SOLOSTAR 100 UNIT/ML Solostar Pen INJECT 60 UNITS S.Q. TWICE DAILY. (Patient taking differently: Inject 60 Units into the skin 2 (two) times daily. ) 24 mL 11   No current facility-administered medications for this visit.     Review of Systems Review of Systems  Blood pressure 140/80, pulse 87, resp. rate 14, height _0  (1.727 m), weight 297 lb (134.7 kg).  Physical Exam Physical Exam  Musculoskeletal:       Legs:      Assessment    Doing well post left thigh abscess drainage and wound VAC treatment.    Plan  Return as needed. The patient is aware to call back for any questions or concerns.   HPI, Physical Exam, Assessment and Plan have been scribed under the direction and in the presence of Hervey Ard, MD.  Gaspar Cola, CMA  I have completed the exam and reviewed the above documentation for accuracy and completeness.  I agree with the above.  Haematologist has been used and any errors in dictation or transcription are unintentional.  Hervey Ard, M.D., F.A.C.S.  Forest Gleason [name] 03/02/2018, 5:15 PM

## 2018-03-01 NOTE — Patient Instructions (Addendum)
The patient is aware to call back for any questions or concerns. Return as needed.

## 2018-03-02 NOTE — Telephone Encounter (Signed)
Attempted to contact pt regarding home visit with no success.  Left VM for a return call.   Marylouise Stacks, EMT-Paramedic  03/02/18

## 2018-03-03 ENCOUNTER — Other Ambulatory Visit (INDEPENDENT_AMBULATORY_CARE_PROVIDER_SITE_OTHER): Payer: Self-pay | Admitting: Nurse Practitioner

## 2018-03-07 ENCOUNTER — Telehealth (HOSPITAL_COMMUNITY): Payer: Self-pay

## 2018-03-07 ENCOUNTER — Other Ambulatory Visit: Payer: Self-pay

## 2018-03-07 ENCOUNTER — Encounter
Admission: RE | Admit: 2018-03-07 | Discharge: 2018-03-07 | Disposition: A | Discharge status: Home / Self Care | Payer: Medicaid Other | Source: Ambulatory Visit | Attending: Vascular Surgery | Admitting: Vascular Surgery

## 2018-03-07 DIAGNOSIS — Z01812 Encounter for preprocedural laboratory examination: Secondary | ICD-10-CM | POA: Insufficient documentation

## 2018-03-07 HISTORY — DX: Acute myocardial infarction, unspecified: I21.9

## 2018-03-07 HISTORY — DX: Pneumonia, unspecified organism: J18.9

## 2018-03-07 LAB — CREATININE, SERUM
Creatinine, Ser: 3.41 mg/dL — ABNORMAL HIGH (ref 0.44–1.00)
GFR calc Af Amer: 16 mL/min — ABNORMAL LOW (ref >60)
GFR calc non Af Amer: 14 mL/min — ABNORMAL LOW (ref >60)

## 2018-03-07 LAB — BUN: BUN: 115 mg/dL — ABNORMAL HIGH (ref 6–20)

## 2018-03-07 MED ORDER — DEXTROSE 5 % IV SOLN
2.0000 g | Freq: Once | INTRAVENOUS | Status: AC
  Administered 2018-03-08: 2 g via INTRAVENOUS
  Filled 2018-03-07: qty 20

## 2018-03-07 NOTE — Telephone Encounter (Signed)
Call placed to pt regarding further home visits as she has been unable to be reached for the past few wks. Pt advised she has been busy with multiple doc visits and has a procedure tomor and does not prefer to have further home visits. She states she is doing fine and does not need further assistance at this time. She reports she is going to call pharmacy to stop the bubble packs as she prefers to do it herself so she knows exactly what she is taking. Pt will be d/c from paramedicine program at this time.  Advised pt if she changed her mind to call me back.   [name], [place] 03/07/18

## 2018-03-07 NOTE — Patient Instructions (Signed)
FOLLOW INSTRUCTIONS GIVEN TO YOU BY [facility].  Your procedure is scheduled with Dr. Delana Meyer.                           On:  Tuesday, March 08, 2018    Go to '[facility]' on the first floor of the [facility].  Do not eat or drink 8 hours prior to your procedure.    Please call Dr Nino Parsley office with any questions or concerns: [phone].  You will need to have someone drive you home and stay with you the night of the procedure.

## 2018-03-08 ENCOUNTER — Encounter: Payer: Self-pay | Admitting: Vascular Surgery

## 2018-03-08 ENCOUNTER — Ambulatory Visit: Payer: Medicaid Other

## 2018-03-08 ENCOUNTER — Ambulatory Visit
Admission: RE | Admit: 2018-03-08 | Discharge: 2018-03-08 | Disposition: A | Discharge status: Home / Self Care | Payer: Medicaid Other | Source: Ambulatory Visit | Attending: Vascular Surgery | Admitting: Vascular Surgery

## 2018-03-08 ENCOUNTER — Encounter
Admission: RE | Disposition: A | Discharge status: Home / Self Care | Payer: Self-pay | Source: Ambulatory Visit | Attending: Vascular Surgery

## 2018-03-08 ENCOUNTER — Other Ambulatory Visit: Payer: Self-pay | Admitting: Physician Assistant

## 2018-03-08 DIAGNOSIS — Z833 Family history of diabetes mellitus: Secondary | ICD-10-CM | POA: Insufficient documentation

## 2018-03-08 DIAGNOSIS — K219 Gastro-esophageal reflux disease without esophagitis: Secondary | ICD-10-CM | POA: Diagnosis not present

## 2018-03-08 DIAGNOSIS — Z9889 Other specified postprocedural states: Secondary | ICD-10-CM | POA: Insufficient documentation

## 2018-03-08 DIAGNOSIS — I998 Other disorder of circulatory system: Secondary | ICD-10-CM | POA: Diagnosis not present

## 2018-03-08 DIAGNOSIS — E785 Hyperlipidemia, unspecified: Secondary | ICD-10-CM | POA: Diagnosis not present

## 2018-03-08 DIAGNOSIS — I2510 Atherosclerotic heart disease of native coronary artery without angina pectoris: Secondary | ICD-10-CM | POA: Diagnosis not present

## 2018-03-08 DIAGNOSIS — Z888 Allergy status to other drugs, medicaments and biological substances status: Secondary | ICD-10-CM | POA: Diagnosis not present

## 2018-03-08 DIAGNOSIS — Z79899 Other long term (current) drug therapy: Secondary | ICD-10-CM | POA: Insufficient documentation

## 2018-03-08 DIAGNOSIS — B1920 Unspecified viral hepatitis C without hepatic coma: Secondary | ICD-10-CM | POA: Insufficient documentation

## 2018-03-08 DIAGNOSIS — E559 Vitamin D deficiency, unspecified: Secondary | ICD-10-CM

## 2018-03-08 DIAGNOSIS — E118 Type 2 diabetes mellitus with unspecified complications: Secondary | ICD-10-CM | POA: Insufficient documentation

## 2018-03-08 DIAGNOSIS — Z992 Dependence on renal dialysis: Secondary | ICD-10-CM | POA: Diagnosis not present

## 2018-03-08 DIAGNOSIS — R918 Other nonspecific abnormal finding of lung field: Secondary | ICD-10-CM | POA: Diagnosis not present

## 2018-03-08 DIAGNOSIS — M1990 Unspecified osteoarthritis, unspecified site: Secondary | ICD-10-CM | POA: Diagnosis not present

## 2018-03-08 DIAGNOSIS — I749 Embolism and thrombosis of unspecified artery: Secondary | ICD-10-CM

## 2018-03-08 DIAGNOSIS — Z87891 Personal history of nicotine dependence: Secondary | ICD-10-CM | POA: Insufficient documentation

## 2018-03-08 DIAGNOSIS — Z955 Presence of coronary angioplasty implant and graft: Secondary | ICD-10-CM | POA: Insufficient documentation

## 2018-03-08 DIAGNOSIS — L97909 Non-pressure chronic ulcer of unspecified part of unspecified lower leg with unspecified severity: Secondary | ICD-10-CM

## 2018-03-08 DIAGNOSIS — Z7982 Long term (current) use of aspirin: Secondary | ICD-10-CM | POA: Insufficient documentation

## 2018-03-08 DIAGNOSIS — E1122 Type 2 diabetes mellitus with diabetic chronic kidney disease: Secondary | ICD-10-CM | POA: Insufficient documentation

## 2018-03-08 DIAGNOSIS — Z885 Allergy status to narcotic agent status: Secondary | ICD-10-CM | POA: Insufficient documentation

## 2018-03-08 DIAGNOSIS — I77810 Thoracic aortic ectasia: Secondary | ICD-10-CM | POA: Insufficient documentation

## 2018-03-08 DIAGNOSIS — E669 Obesity, unspecified: Secondary | ICD-10-CM | POA: Diagnosis not present

## 2018-03-08 DIAGNOSIS — B351 Tinea unguium: Secondary | ICD-10-CM | POA: Insufficient documentation

## 2018-03-08 DIAGNOSIS — N184 Chronic kidney disease, stage 4 (severe): Secondary | ICD-10-CM | POA: Diagnosis not present

## 2018-03-08 DIAGNOSIS — I130 Hypertensive heart and chronic kidney disease with heart failure and stage 1 through stage 4 chronic kidney disease, or unspecified chronic kidney disease: Secondary | ICD-10-CM | POA: Insufficient documentation

## 2018-03-08 DIAGNOSIS — I10 Essential (primary) hypertension: Secondary | ICD-10-CM

## 2018-03-08 DIAGNOSIS — Z9049 Acquired absence of other specified parts of digestive tract: Secondary | ICD-10-CM | POA: Insufficient documentation

## 2018-03-08 DIAGNOSIS — I700 Atherosclerosis of aorta: Secondary | ICD-10-CM | POA: Diagnosis not present

## 2018-03-08 DIAGNOSIS — Z6841 Body Mass Index (BMI) 40.0 and over, adult: Secondary | ICD-10-CM | POA: Insufficient documentation

## 2018-03-08 DIAGNOSIS — I70299 Other atherosclerosis of native arteries of extremities, unspecified extremity: Secondary | ICD-10-CM

## 2018-03-08 DIAGNOSIS — I5042 Chronic combined systolic (congestive) and diastolic (congestive) heart failure: Secondary | ICD-10-CM | POA: Insufficient documentation

## 2018-03-08 DIAGNOSIS — Z7951 Long term (current) use of inhaled steroids: Secondary | ICD-10-CM | POA: Insufficient documentation

## 2018-03-08 DIAGNOSIS — F1411 Cocaine abuse, in remission: Secondary | ICD-10-CM | POA: Insufficient documentation

## 2018-03-08 DIAGNOSIS — Z8249 Family history of ischemic heart disease and other diseases of the circulatory system: Secondary | ICD-10-CM | POA: Insufficient documentation

## 2018-03-08 DIAGNOSIS — Z794 Long term (current) use of insulin: Secondary | ICD-10-CM | POA: Insufficient documentation

## 2018-03-08 DIAGNOSIS — Z91013 Allergy to seafood: Secondary | ICD-10-CM | POA: Insufficient documentation

## 2018-03-08 DIAGNOSIS — Z9851 Tubal ligation status: Secondary | ICD-10-CM | POA: Insufficient documentation

## 2018-03-08 DIAGNOSIS — G4733 Obstructive sleep apnea (adult) (pediatric): Secondary | ICD-10-CM | POA: Diagnosis not present

## 2018-03-08 HISTORY — PX: LOWER EXTREMITY ANGIOGRAPHY: CATH118251

## 2018-03-08 LAB — GLUCOSE, CAPILLARY: Glucose-Capillary: 146 mg/dL — ABNORMAL HIGH (ref 70–99)

## 2018-03-08 SURGERY — LOWER EXTREMITY ANGIOGRAPHY
Anesthesia: Moderate Sedation | Laterality: Right

## 2018-03-08 MED ORDER — DIPHENHYDRAMINE HCL 50 MG/ML IJ SOLN: 50.0000 mg | Freq: Once | INTRAMUSCULAR | Status: AC

## 2018-03-08 MED ORDER — CEFAZOLIN SODIUM-DEXTROSE 2-4 GM/100ML-% IV SOLN
INTRAVENOUS | Status: AC
  Filled 2018-03-08: qty 100

## 2018-03-08 MED ORDER — HEPARIN (PORCINE) IN NACL 1000-0.9 UT/500ML-% IV SOLN
INTRAVENOUS | Status: AC
  Filled 2018-03-08: qty 1000

## 2018-03-08 MED ORDER — FENTANYL CITRATE (PF) 100 MCG/2ML IJ SOLN
INTRAMUSCULAR | Status: AC
  Filled 2018-03-08: qty 2

## 2018-03-08 MED ORDER — HYDROMORPHONE HCL 1 MG/ML IJ SOLN: 1.0000 mg | Freq: Once | INTRAMUSCULAR | Status: DC | PRN

## 2018-03-08 MED ORDER — SODIUM CHLORIDE 0.9 % IV SOLN
INTRAVENOUS | Status: DC
  Administered 2018-03-08: 08:00:00 via INTRAVENOUS

## 2018-03-08 MED ORDER — SODIUM CHLORIDE 0.9% FLUSH: 3.0000 mL | Freq: Two times a day (BID) | INTRAVENOUS | Status: DC

## 2018-03-08 MED ORDER — FAMOTIDINE 20 MG PO TABS
ORAL_TABLET | ORAL | Status: AC
  Administered 2018-03-08: 40 mg via ORAL
  Filled 2018-03-08: qty 2

## 2018-03-08 MED ORDER — SODIUM CHLORIDE 0.9% FLUSH: 3.0000 mL | INTRAVENOUS | Status: DC | PRN

## 2018-03-08 MED ORDER — DIPHENHYDRAMINE HCL 25 MG PO CAPS
50.0000 mg | ORAL_CAPSULE | Freq: Once | ORAL | Status: AC
  Administered 2018-03-08: 50 mg via ORAL

## 2018-03-08 MED ORDER — SODIUM CHLORIDE 0.9 % IV SOLN: INTRAVENOUS | Status: AC

## 2018-03-08 MED ORDER — IOPAMIDOL (ISOVUE-300) INJECTION 61%
INTRAVENOUS | Status: DC | PRN
  Administered 2018-03-08: 40 mL via INTRA_ARTERIAL

## 2018-03-08 MED ORDER — FENTANYL CITRATE (PF) 100 MCG/2ML IJ SOLN
INTRAMUSCULAR | Status: DC | PRN
  Administered 2018-03-08 (×2): 50 ug via INTRAVENOUS
  Administered 2018-03-08: 25 ug via INTRAVENOUS

## 2018-03-08 MED ORDER — HYDRALAZINE HCL 20 MG/ML IJ SOLN: 5.0000 mg | INTRAMUSCULAR | Status: DC | PRN

## 2018-03-08 MED ORDER — MORPHINE SULFATE (PF) 4 MG/ML IV SOLN: 2.0000 mg | INTRAVENOUS | Status: DC | PRN

## 2018-03-08 MED ORDER — ONDANSETRON HCL 4 MG/2ML IJ SOLN: 4.0000 mg | Freq: Four times a day (QID) | INTRAMUSCULAR | Status: DC | PRN

## 2018-03-08 MED ORDER — LABETALOL HCL 5 MG/ML IV SOLN: 10.0000 mg | INTRAVENOUS | Status: DC | PRN

## 2018-03-08 MED ORDER — ACETAMINOPHEN 325 MG PO TABS: 650.0000 mg | ORAL_TABLET | ORAL | Status: DC | PRN

## 2018-03-08 MED ORDER — SODIUM CHLORIDE 0.9 % IV BOLUS: 500.0000 mL | Freq: Once | INTRAVENOUS | Status: DC

## 2018-03-08 MED ORDER — HEPARIN SODIUM (PORCINE) 1000 UNIT/ML IJ SOLN
INTRAMUSCULAR | Status: AC
  Filled 2018-03-08: qty 1

## 2018-03-08 MED ORDER — SODIUM CHLORIDE 0.9 % IV SOLN: 250.0000 mL | INTRAVENOUS | Status: DC | PRN

## 2018-03-08 MED ORDER — METHYLPREDNISOLONE SODIUM SUCC 125 MG IJ SOLR
INTRAMUSCULAR | Status: AC
  Administered 2018-03-08: 125 mg via INTRAVENOUS
  Filled 2018-03-08: qty 2

## 2018-03-08 MED ORDER — MIDAZOLAM HCL 2 MG/2ML IJ SOLN
INTRAMUSCULAR | Status: DC | PRN
  Administered 2018-03-08: 0.5 mg via INTRAVENOUS
  Administered 2018-03-08 (×2): 2 mg via INTRAVENOUS

## 2018-03-08 MED ORDER — MIDAZOLAM HCL 5 MG/5ML IJ SOLN
INTRAMUSCULAR | Status: AC
  Filled 2018-03-08: qty 5

## 2018-03-08 MED ORDER — METHYLPREDNISOLONE SODIUM SUCC 125 MG IJ SOLR
125.0000 mg | Freq: Once | INTRAMUSCULAR | Status: AC
  Administered 2018-03-08: 125 mg via INTRAVENOUS

## 2018-03-08 MED ORDER — FAMOTIDINE 20 MG PO TABS
40.0000 mg | ORAL_TABLET | Freq: Once | ORAL | Status: AC
  Administered 2018-03-08: 40 mg via ORAL

## 2018-03-08 MED ORDER — DIPHENHYDRAMINE HCL 25 MG PO CAPS
ORAL_CAPSULE | ORAL | Status: AC
  Administered 2018-03-08: 50 mg via ORAL
  Filled 2018-03-08: qty 2

## 2018-03-08 MED ORDER — OXYCODONE HCL 5 MG PO TABS: 5.0000 mg | ORAL_TABLET | ORAL | Status: DC | PRN

## 2018-03-08 SURGICAL SUPPLY — 15 items
CANNULA 5F STIFF (CANNULA) ×2 IMPLANT
CATH PIG 70CM (CATHETERS) ×1 IMPLANT
COVER PROBE U/S 5X48 (MISCELLANEOUS) ×2 IMPLANT
DEVICE STARCLOSE SE CLOSURE (Vascular Products) ×1 IMPLANT
DEVICE TORQUE .025-.038 (MISCELLANEOUS) ×1 IMPLANT
GUIDEWIRE SUPER STIFF .035X180 (WIRE) ×1 IMPLANT
NDL ENTRY 21GA 7CM ECHOTIP (NEEDLE) IMPLANT
NEEDLE ENTRY 21GA 7CM ECHOTIP (NEEDLE) ×2 IMPLANT
PACK ANGIOGRAPHY (CUSTOM PROCEDURE TRAY) ×2 IMPLANT
SET INTRO CAPELLA COAXIAL (SET/KITS/TRAYS/PACK) ×1 IMPLANT
SHEATH BRITE TIP 5FRX11 (SHEATH) ×1 IMPLANT
TOWEL OR 17X26 4PK STRL BLUE (TOWEL DISPOSABLE) ×1 IMPLANT
TUBING CONTRAST HIGH PRESS 72 (TUBING) ×1 IMPLANT
WIRE AQUATRACK .035X260CM (WIRE) ×2 IMPLANT
WIRE J 3MM .035X145CM (WIRE) ×1 IMPLANT

## 2018-03-08 NOTE — Op Note (Signed)
[facility]  Percutaneous Study/Intervention Procedural Note   Date of Surgery: 03/08/2018,9:28 AM  Surgeon:Gertrude Bucks, Dolores Lory   Pre-operative Diagnosis: Ischemic right great toe; arterial embolization right foot  Post-operative diagnosis:  Same  Procedure(s) Performed:  1.  Abdominal aortogram  2.  Right lower extremity distal runoff third order catheter placement  3.  Start closed left common femoral artery    Anesthesia: Conscious sedation was administered by the interventional radiology RN under my direct supervision. IV Versed plus fentanyl were utilized. Continuous ECG, pulse oximetry and blood pressure was monitored throughout the entire procedure.  Conscious sedation was administered for a total of 30 minutes.  Sheath: 5 French Pinnacle sheath left common femoral artery retrograde  Contrast: 40 cc   Fluoroscopy Time: 2.9 minutes  Indications:  The patient presents to [facility] with ischemic changes to the right foot and heel.  Pedal pulses are nonpalpable bilaterally suggesting atherosclerotic occlusive disease.  The risks and benefits as well as alternative therapies for lower extremity revascularization are reviewed with the patient all questions are answered the patient agrees to proceed.  The patient is therefore undergoing angiography with the hope for intervention for limb salvage.   Procedure:  [name] a 62 y.o. female who was identified and appropriate procedural time out was performed.  The patient was then placed supine on the table and prepped and draped in the usual sterile fashion.  Ultrasound was used to evaluate the left common femoral artery.  It was echolucent and pulsatile indicating it is patent .  An ultrasound image was acquired for the permanent record.  A micropuncture needle was used to access the left common femoral artery under direct ultrasound guidance.  The microwire was then advanced under fluoroscopic guidance  without difficulty followed by the micro-sheath.  A 0.035 J wire was advanced without resistance and a 5Fr sheath was placed.    Pigtail catheter was then advanced to the level of T12 and AP projection of the aorta was obtained. Pigtail catheter was then repositioned to above the bifurcation and LAO view of the pelvis was obtained. Stiff angled Glidewire and pigtail catheter was then used across the bifurcation and the catheter was positioned in the distal external iliac artery.  RAO of the right groin was then obtained. Wire was reintroduced and negotiated into the SFA and the catheter was advanced into the SFA. Distal runoff was then performed.  After review of the images the catheter was removed over wire and an LAO view of the groin was obtained. StarClose device was deployed without difficulty.   Findings:   Aortogram: Widely patent no evidence of atherosclerotic plaque formation no abnormalities or aneurysmal changes noted no dissection noted  Right Lower Extremity: The right common femoral profunda femoris superficial femoral and popliteal arteries are opacified with contrast.  There are no abnormalities noted.  Trifurcation is widely patent and there is three-vessel runoff to the foot.  Pedal arch is opacified and there are digital vessels noted to the toes.  No strictures, obstructions, aneurysmal deteriorations or dissections are identified.  Summary: No arterial abnormality is identified.  No source for embolization is identified.  I will obtain a CT scan of the chest abdomen and pelvis using the contrast we have already administered for a delayed image to exclude aneurysmal changes and once this is been confirmed a cardiac source for embolization becomes the most likely explanation.   Disposition: Patient was taken to the recovery room in stable condition  having tolerated the procedure well.  [name] 03/08/2018,9:28 AM

## 2018-03-08 NOTE — Discharge Instructions (Signed)
Angiogram, Care After °This sheet gives you information about how to care for yourself after your procedure. Your doctor may also give you more specific instructions. If you have problems or questions, contact your doctor. °Follow these instructions at home: °Insertion site care °· Follow instructions from your doctor about how to take care of your long, thin tube (catheter) insertion area. Make sure you: °? Wash your hands with soap and water before you change your bandage (dressing). If you cannot use soap and water, use hand sanitizer. °? Change your bandage as told by your doctor. °? Leave stitches (sutures), skin glue, or skin tape (adhesive) strips in place. They may need to stay in place for 2 weeks or longer. If tape strips get loose and curl up, you may trim the loose edges. Do not remove tape strips completely unless your doctor says it is okay. °· Do not take baths, swim, or use a hot tub until your doctor says it is okay. °· You may shower 24-48 hours after the procedure or as told by your doctor. °? Gently wash the area with plain soap and water. °? Pat the area dry with a clean towel. °? Do not rub the area. This may cause bleeding. °· Do not apply powder or lotion to the area. Keep the area clean and dry. °· Check your insertion area every day for signs of infection. Check for: °? More redness, swelling, or pain. °? Fluid or blood. °? Warmth. °? Pus or a bad smell. °Activity °· Rest as told by your doctor, usually for 1-2 days. °· Do not lift anything that is heavier than 10 lbs. (4.5 kg) or as told by your doctor. °· Do not drive for 24 hours if you were given a medicine to help you relax (sedative). °· Do not drive or use heavy machinery while taking prescription pain medicine. °General instructions °· Go back to your normal activities as told by your doctor, usually in about a week. Ask your doctor what activities are safe for you. °· If the insertion area starts to bleed, lie flat and put pressure  on the area. If the bleeding does not stop, get help right away. This is an emergency. °· Drink enough fluid to keep your pee (urine) clear or pale yellow. °· Take over-the-counter and prescription medicines only as told by your doctor. °· Keep all follow-up visits as told by your doctor. This is important. °Contact a doctor if: °· You have a fever. °· You have chills. °· You have more redness, swelling, or pain around your insertion area. °· You have fluid or blood coming from your insertion area. °· The insertion area feels warm to the touch. °· You have pus or a bad smell coming from your insertion area. °· You have more bruising around the insertion area. °· Blood collects in the tissue around the insertion area (hematoma) that may be painful to the touch. °Get help right away if: °· You have a lot of pain in the insertion area. °· The insertion area swells very fast. °· The insertion area is bleeding, and the bleeding does not stop after holding steady pressure on the area. °· The area near or just beyond the insertion area becomes pale, cool, tingly, or numb. °These symptoms may be an emergency. Do not wait to see if the symptoms will go away. Get medical help right away. Call your local emergency services (911 in the [place] Do not drive yourself to the hospital. °Summary °·   After the procedure, it is common to have bruising and tenderness at the long, thin tube insertion area. °· After the procedure, it is important to rest and drink plenty of fluids. °· Do not take baths, swim, or use a hot tub until your doctor says it is okay to do so. You may shower 24-48 hours after the procedure or as told by your doctor. °· If the insertion area starts to bleed, lie flat and put pressure on the area. If the bleeding does not stop, get help right away. This is an emergency. °This information is not intended to replace advice given to you by your health care provider. Make sure you discuss any questions you have with  your health care provider. °Document Released: 09/25/2008 Document Revised: 06/23/2016 Document Reviewed: 06/23/2016 °[employer]vier Interactive Patient Education © 2017 [employer] ° °

## 2018-03-08 NOTE — H&P (Signed)
[facility] History & Physical Update  The patient was interviewed and re-examined.  The patient's previous History and Physical has been reviewed and is unchanged.  There is no change in the plan of care. We plan to proceed with the scheduled procedure.  Hortencia Pilar, MD  03/08/2018, 8:03 AM

## 2018-03-21 ENCOUNTER — Ambulatory Visit: Payer: Medicaid Other | Admitting: Family

## 2018-03-22 ENCOUNTER — Ambulatory Visit (INDEPENDENT_AMBULATORY_CARE_PROVIDER_SITE_OTHER): Payer: Medicaid Other | Admitting: Vascular Surgery

## 2018-03-22 ENCOUNTER — Encounter (INDEPENDENT_AMBULATORY_CARE_PROVIDER_SITE_OTHER): Payer: Self-pay | Admitting: Vascular Surgery

## 2018-03-22 VITALS — BP 176/74 | HR 96 | Resp 16 | Ht 68.0 in | Wt 303.0 lb

## 2018-03-22 DIAGNOSIS — I749 Embolism and thrombosis of unspecified artery: Secondary | ICD-10-CM

## 2018-03-22 DIAGNOSIS — I712 Thoracic aortic aneurysm, without rupture, unspecified: Secondary | ICD-10-CM

## 2018-03-22 NOTE — Progress Notes (Signed)
Subjective:    Patient ID: [name], female    DOB: 10/05/1955, 62 y.o.   MRN: [id number] Chief Complaint  Patient presents with  . Follow-up    2 week Angio (LE) follow up   Patient presents for her first post procedure follow-up.  The patient is status post a right lower extremity angiogram without the need for intervention on March 08, 2018.  The patient originally presented with what looks like a embolus to the toes located on her right foot.  An angiogram of the right lower extremity was unremarkable.  CT of the abdomen and pelvis looking for aneurysmal disease was notable for a 3.9 cm a sending thoracic aneurysm.  This is stable when compared to imaging in 2016.  The patient notes an improvement in the overall pain and discoloration of her right foot since our last visit.  The patient notes that she has not received a phone call from podiatry in regard to the referral.  Patient denies any worsening in symptoms.  Patient denies any claudication-like symptoms, rest pain or ulcer formation to the bilateral lower extremity.  Patient denies any fever, nausea vomiting.  Patient denies any shortness of breath or chest pain.  Review of Systems  Constitutional: Negative.   HENT: Negative.   Eyes: Negative.   Respiratory: Negative.   Cardiovascular:       Thoracic Aneurysm  Gastrointestinal: Negative.   Endocrine: Negative.   Genitourinary: Negative.   Musculoskeletal: Negative.   Skin: Negative.   Allergic/Immunologic: Negative.   Neurological: Negative.   Hematological: Negative.   Psychiatric/Behavioral: Negative.       Objective:   Physical Exam  Constitutional: She is oriented to person, place, and time. She appears well-developed and well-nourished. No distress.  HENT:  Head: Normocephalic and atraumatic.  Right Ear: External ear normal.  Left Ear: External ear normal.  Eyes: Pupils are equal, round, and reactive to light. Conjunctivae and EOM are normal.  Neck: Normal  range of motion.  Cardiovascular: Normal rate, regular rhythm, normal heart sounds and intact distal pulses.  Pulses:      Radial pulses are 2+ on the right side, and 2+ on the left side.  Faint pedal pulses.  Discoloration has improved.  Swelling is improved.  Erythema has improved.  No ulcers are noted.  Pulmonary/Chest: Effort normal and breath sounds normal.  Musculoskeletal: Normal range of motion. She exhibits edema (Mild bilateral lower extremity edema).  Neurological: She is alert and oriented to person, place, and time.  Skin: Skin is warm and dry. She is not diaphoretic.  Psychiatric: She has a normal mood and affect. Her behavior is normal. Judgment and thought content normal.  Vitals reviewed.  BP (!) 176/74 (BP Location: Right Arm, Patient Position: Sitting)   Pulse 96   Resp 16   Ht 5' 8"  (1.727 m)   Wt (!) 303 lb (137.4 kg)   BMI 46.07 kg/m   Past Medical History:  Diagnosis Date  . Anemia   . Aortic stenosis    Echo 8/18: mean 13, peak 28, LVOT/AV mean velocity 0.51  . Arthritis   . Asthma    As a child   . Bronchitis   . CAD (coronary artery disease)    a. 09/2016: 50% Ost 1st Mrg stenosis, 50% 2nd Mrg stenosis, 20% Mid-Cx, 95% Prox LAD, 40% mid-LAD, and 10% dist-LAD stenosis. Staged PCI with DES to Prox-LAD.   [name] Chronic combined systolic and diastolic CHF (congestive heart failure) ([place])  2011   echo 2/18: EF 55-60, normal wall motion, grade 2 diastolic dysfunction, trivial AI // echo 3/18: Septal and apical HK, EF 45-50, normal wall motion, trivial AI, mild LAE, PASP 38 // echo 8/18: EF 60-65, normal wall motion, grade 1 diastolic dysfunction, calcified aortic valve leaflets, mild aortic stenosis (mean 13, peak 28, LVOT/AV mean velocity 0.51), mild AI, moderate MAC, mild LAE, trivial TR   . Chronic kidney disease    STAGE 4  . Depression   . Diabetes mellitus Dx 1989  . GERD (gastroesophageal reflux disease)   . Gout   . Hepatitis C Dx 2013  . Hypertension Dx  1989  . Myocardial infarction ([place]) 07/2015  . Obesity   . Obstructive sleep apnea   . Pancreatitis 2013  . Pneumonia   . Refusal of blood transfusions as patient is Jehovah's Witness   . Tendinitis   . Tremors of nervous system    LEFT HAND  . Ulcer 2010   Social History   Socioeconomic History  . Marital status: Divorced    Spouse name: Not on file  . Number of children: Not on file  . Years of education: Not on file  . Highest education level: Not on file  Occupational History  . Not on file  Social Needs  . Financial resource strain: Not on file  . Food insecurity:    Worry: Not on file    Inability: Not on file  . Transportation needs:    Medical: Not on file    Non-medical: Not on file  Tobacco Use  . Smoking status: Former Smoker    Types: Cigarettes    Last attempt to quit: 10/25/1980    Years since quitting: 37.4  . Smokeless tobacco: Never Used  . Tobacco comment: quit smoking in 1982  Substance and Sexual Activity  . Alcohol use: Yes    Comment: rarely  . Drug use: No    Types: "Crack" cocaine    Comment: 08/21/2016 "clean since 05/1998"  . Sexual activity: Not on file    Comment: Not asked  Lifestyle  . Physical activity:    Days per week: Not on file    Minutes per session: Not on file  . Stress: Not on file  Relationships  . Social connections:    Talks on phone: Not on file    Gets together: Not on file    Attends religious service: Not on file    Active member of club or organization: Not on file    Attends meetings of clubs or organizations: Not on file    Relationship status: Not on file  . Intimate partner violence:    Fear of current or ex partner: Not on file    Emotionally abused: Not on file    Physically abused: Not on file    Forced sexual activity: Not on file  Other Topics Concern  . Not on file  Social History Narrative  . Not on file   Past Surgical History:  Procedure Laterality Date  . APPLICATION OF WOUND VAC Left  08/14/2017   Procedure: APPLICATION OF WOUND VAC Exchange;  Surgeon: Robert Bellow, MD;  Location: [facility] ORS;  Service: General;  Laterality: Left;  . CHOLECYSTECTOMY    . COLONOSCOPY WITH PROPOFOL N/A 02/03/2018   Procedure: COLONOSCOPY WITH PROPOFOL;  Surgeon: Lin Landsman, MD;  Location: [facility] ENDOSCOPY;  Service: Gastroenterology;  Laterality: N/A;  . CORONARY ANGIOPLASTY  07/2015   STENT  . CORONARY  STENT INTERVENTION N/A 09/18/2016   Procedure: Coronary Stent Intervention;  Surgeon: Troy Sine, MD;  Location: [place] [facility];  Service: Cardiovascular;  Laterality: N/A;  . DRESSING CHANGE UNDER ANESTHESIA Left 08/15/2017   Procedure: exploration of wound for bleeding;  Surgeon: Robert Bellow, MD;  Location: [facility] ORS;  Service: General;  Laterality: Left;  . ESOPHAGOGASTRODUODENOSCOPY (EGD) WITH PROPOFOL N/A 02/03/2018   Procedure: ESOPHAGOGASTRODUODENOSCOPY (EGD) WITH PROPOFOL;  Surgeon: Lin Landsman, MD;  Location: [facility] ENDOSCOPY;  Service: Gastroenterology;  Laterality: N/A;  . EYE SURGERY    . INCISION AND DRAINAGE ABSCESS Left 08/12/2017   Procedure: INCISION AND DRAINAGE ABSCESS;  Surgeon: Robert Bellow, MD;  Location: [facility] ORS;  Service: General;  Laterality: Left;  . KNEE ARTHROSCOPY    . LEFT HEART CATH N/A 09/18/2016   Procedure: Left Heart Cath;  Surgeon: Troy Sine, MD;  Location: [place] [facility];  Service: Cardiovascular;  Laterality: N/A;  . LEFT HEART CATH AND CORONARY ANGIOGRAPHY N/A 09/16/2016   Procedure: Left Heart Cath and Coronary Angiography;  Surgeon: Burnell Blanks, MD;  Location: [place] [facility];  Service: Cardiovascular;  Laterality: N/A;  . LEFT HEART CATH AND CORONARY ANGIOGRAPHY N/A 04/29/2017   Procedure: LEFT HEART CATH AND CORONARY ANGIOGRAPHY;  Surgeon: Nelva Bush, MD;  Location: [place] [facility];  Service: Cardiovascular;  Laterality: N/A;  . LOWER EXTREMITY ANGIOGRAPHY Right 03/08/2018   Procedure: LOWER  EXTREMITY ANGIOGRAPHY;  Surgeon: Katha Cabal, MD;  Location: [place] [facility];  Service: Cardiovascular;  Laterality: Right;  . TUBAL LIGATION    . TUBAL LIGATION     Family History  Problem Relation Age of Onset  . Colon cancer Mother   . Heart attack Other   . Heart attack Maternal Grandmother   . Hypertension Sister   . Hypertension Brother   . Diabetes Paternal Grandmother   . Breast cancer Neg Hx    Allergies  Allergen Reactions  . Shellfish Allergy Anaphylaxis and Swelling  . Diazepam Other (See Comments)    "felt like out of body experience"  . Morphine And Related Itching      Assessment & Plan:  Patient presents for her first post procedure follow-up.  The patient is status post a right lower extremity angiogram without the need for intervention on March 08, 2018.  The patient originally presented with what looks like a embolus to the toes located on her right foot.  An angiogram of the right lower extremity was unremarkable.  CT of the abdomen and pelvis looking for aneurysmal disease was notable for a 3.9 cm a sending thoracic aneurysm.  This is stable when compared to imaging in 2016.  The patient notes an improvement in the overall pain and discoloration of her right foot since our last visit.  The patient notes that she has not received a phone call from podiatry in regard to the referral.  Patient denies any worsening in symptoms.  Patient denies any claudication-like symptoms, rest pain or ulcer formation to the bilateral lower extremity.  Patient denies any fever, nausea vomiting.  Patient denies any shortness of breath or chest pain.  1. Thoracic aortic aneurysm without rupture ([place]) - Stable No growth when compared to 2016 studies. Be happy to follow this on a yearly basis due to the size of the patient's aneurysm. Orders have been placed for CTA of the chest in approximately 1 year Patient is to seek medical attention immediately if she  should experience  any shortness of breath or chest pain Patient expresses her understanding Patient understands that she will follow-up in our office to review her CT scan in approximately 1 year  - CT Angio Chest W/Cm &/Or Wo Cm; Future - BUN+Creat; Future  2. Embolism ([facility]) - Stable The patient is status post a right lower extremity angiogram without the need of intervention to rule out peripheral artery disease as a contributing factor to the patient's recent right toe embolus. Will refer the patient to podiatry for routine care as patient is a diabetic and does not have a current podiatrist. Patient is to follow-up PRN  - Ambulatory referral to Podiatry  Current Outpatient Medications on File Prior to Visit  Medication Sig Dispense Refill  . albuterol (PROVENTIL) (2.5 MG/3ML) 0.083% nebulizer solution Take 3 mLs (2.5 mg total) by nebulization every 6 (six) hours as needed for wheezing or shortness of breath. 150 mL 1  . albuterol (VENTOLIN HFA) 108 (90 Base) MCG/ACT inhaler Inhale 2 puffs into the lungs every 4 (four) hours as needed for wheezing or shortness of breath. 54 g 3  . allopurinol (ZYLOPRIM) 100 MG tablet TAKE (2) TABLETS BY MOUTH ONCE DAILY. (Patient taking differently: Take 200 mg by mouth daily. ) 180 tablet 1  . aspirin EC 81 MG EC tablet Take 1 tablet (81 mg total) by mouth daily.    [name] atorvastatin (LIPITOR) 80 MG tablet Take 1 tablet (80 mg total) by mouth daily at 6 PM. (Patient taking differently: Take 80 mg by mouth daily. ) 90 tablet 3  . Blood Glucose Monitoring Suppl (ACCU-CHEK AVIVA) device Use as instructed daily. 1 each 0  . Blood Pressure Monitoring (ADULT BLOOD PRESSURE CUFF LG) KIT Check blood pressure once daily 1 each 0  . carvedilol (COREG) 25 MG tablet TAKE 1 TABLET BY MOUTH 2 TIMES DAILY WITH A MEAL. (Patient taking differently: Take 25 mg by mouth 2 (two) times daily with a meal. ) 180 tablet 3  . Continuous Blood Gluc Receiver (FREESTYLE LIBRE 14 DAY READER) DEVI 1 each  by Does not apply route every 14 (fourteen) days. 1 Device 24  . docusate sodium (COLACE) 100 MG capsule Take 1 capsule (100 mg total) by mouth 2 (two) times daily as needed for mild constipation. 30 capsule 0  . Dulaglutide (TRULICITY) 2.95 JO/8.4ZY SOPN Inject 0.75 mg into the skin once a week. (Patient taking differently: Inject 0.75 mg into the skin once a week. Thurs) 4 pen 3  . FEROSUL 325 (65 Fe) MG tablet TAKE (1) TABLET BY MOUTH EVERY OTHER DAY. 45 tablet 1  . Fluticasone-Salmeterol (ADVAIR DISKUS) 250-50 MCG/DOSE AEPB Inhale 1 puff into the lungs 2 (two) times daily. 180 each 3  . glucose blood (ACCU-CHEK AVIVA) test strip Use as instructed daily 100 each 12  . glucose blood (TRUE METRIX BLOOD GLUCOSE TEST) test strip Use as instructed 100 each 12  . hydrALAZINE (APRESOLINE) 100 MG tablet Take 1 tablet (100 mg total) by mouth 3 (three) times daily. 270 tablet 1  . hydrOXYzine (ATARAX/VISTARIL) 25 MG tablet Take 1 tablet (25 mg total) by mouth 2 (two) times daily as needed. (Patient taking differently: Take 25 mg by mouth 2 (two) times daily as needed for itching. ) 180 tablet 1  . insulin aspart (NOVOLOG) 100 UNIT/ML FlexPen Inject 10 Units into the skin 3 (three) times daily with meals. (Patient taking differently: Inject 25 Units into the skin 3 (three) times daily with meals. )  15 mL 3  . Insulin Pen Needle (PEN NEEDLES) 30G X 8 MM MISC 1 each by Does not apply route 3 (three) times daily before meals. AC for novolog injections 100 each 11  . isosorbide mononitrate (IMDUR) 30 MG 24 hr tablet TAKE (3) TABLETS BY MOUTH ONCE DAILY. 90 tablet 3  . ketotifen (ZADITOR) 0.025 % ophthalmic solution Place 1 drop into both eyes daily.     Elmore Guise Devices (ACCU-CHEK SOFTCLIX) lancets Use as instructed daily. 1 each 5  . LANTUS SOLOSTAR 100 UNIT/ML Solostar Pen INJECT 60 UNITS S.Q. TWICE DAILY. (Patient taking differently: Inject 60 Units into the skin 2 (two) times daily. ) 24 mL 11  . loratadine  (CLARITIN) 10 MG tablet Take 1 tablet (10 mg total) by mouth daily. 90 tablet 1  . metolazone (ZAROXOLYN) 2.5 MG tablet Take 1 tablet (2.5 mg total) by mouth daily as needed (swelling). 90 tablet 1  . montelukast (SINGULAIR) 10 MG tablet TAKE ONE TABLET BY MOUTH AT BEDTIME. (Patient taking differently: Take 10 mg by mouth at bedtime. ) 90 tablet 1  . nitroGLYCERIN (NITROSTAT) 0.4 MG SL tablet Place 1 tablet (0.4 mg total) under the tongue every 5 (five) minutes x 3 doses as needed for chest pain. 25 tablet 12  . omeprazole (PRILOSEC) 20 MG capsule Take 1 capsule (20 mg total) by mouth daily. 90 capsule 3  . potassium chloride SA (K-DUR,KLOR-CON) 20 MEQ tablet Take 1 tablet (20 mEq total) by mouth 2 (two) times daily. 60 tablet 5  . pregabalin (LYRICA) 75 MG capsule Take 1 capsule (75 mg total) by mouth 2 (two) times daily. 180 capsule 1  . SENNA PLUS 8.6-50 MG tablet Take 1 tablet by mouth daily as needed.   5  . silver sulfADIAZINE (SILVADENE) 1 % cream Apply 1 application topically daily. Apply to right first toe 50 g 0  . ticagrelor (BRILINTA) 90 MG TABS tablet Take 1 tablet (90 mg total) by mouth 2 (two) times daily. 180 tablet 3  . torsemide (DEMADEX) 20 MG tablet TAKE (2) TABLETS BY MOUTH TWICE DAILY. (Patient taking differently: Take 40 mg by mouth 2 (two) times daily. ) 120 tablet 5  . traMADol (ULTRAM) 50 MG tablet One to Two Tabs Every Six Hours As Needed For Pain (Patient taking differently: Take 50-100 mg by mouth every 6 (six) hours as needed for moderate pain. ) 60 tablet 0  . triamcinolone cream (KENALOG) 0.1 % Apply 1 application topically 2 (two) times daily. 80 g 0  . valACYclovir (VALTREX) 500 MG tablet TAKE (1) TABLET BY MOUTH EVERY OTHER DAY. (Patient taking differently: Take 500 mg by mouth every other day. ) 45 tablet 1  . Vitamin D, Ergocalciferol, (DRISDOL) 50000 units CAPS capsule TAKE 1 CAPSULE BY MOUTH ONCE A MONTH 3 capsule 3  . fluticasone (FLONASE) 50 MCG/ACT nasal spray  Place 2 sprays into both nostrils daily as needed for allergies or rhinitis. (Patient not taking: Reported on 03/22/2018) 48 g 1  . [DISCONTINUED] gabapentin (NEURONTIN) 100 MG capsule Take 1 capsule (100 mg total) by mouth 3 (three) times daily. 90 capsule 1   No current facility-administered medications on file prior to visit.    There are no Patient Instructions on file for this visit. No follow-ups on file.  Ellinore Merced A Kamdyn Colborn, PA-C

## 2018-03-23 ENCOUNTER — Ambulatory Visit: Payer: Medicaid Other | Admitting: Family

## 2018-03-23 ENCOUNTER — Telehealth: Payer: Self-pay | Admitting: Family

## 2018-03-23 NOTE — Telephone Encounter (Signed)
Patient did not show for her [facility] appointment on 03/23/18. Will attempt to reschedule.

## 2018-03-28 ENCOUNTER — Ambulatory Visit: Payer: Medicaid Other | Admitting: Physician Assistant

## 2018-03-28 NOTE — Progress Notes (Deleted)
Patient: [name] Female    DOB: December 31, 1955   62 y.o.   MRN: [id number] Visit Date: 03/28/2018  Today's Provider: [name], PA-C   No chief complaint on file.  Subjective:    HPI     Allergies  Allergen Reactions  . Shellfish Allergy Anaphylaxis and Swelling  . Diazepam Other (See Comments)    "felt like out of body experience"  . Morphine And Related Itching     Current Outpatient Medications:  .  albuterol (PROVENTIL) (2.5 MG/3ML) 0.083% nebulizer solution, Take 3 mLs (2.5 mg total) by nebulization every 6 (six) hours as needed for wheezing or shortness of breath., Disp: 150 mL, Rfl: 1 .  albuterol (VENTOLIN HFA) 108 (90 Base) MCG/ACT inhaler, Inhale 2 puffs into the lungs every 4 (four) hours as needed for wheezing or shortness of breath., Disp: 54 g, Rfl: 3 .  allopurinol (ZYLOPRIM) 100 MG tablet, TAKE (2) TABLETS BY MOUTH ONCE DAILY. (Patient taking differently: Take 200 mg by mouth daily. ), Disp: 180 tablet, Rfl: 1 .  aspirin EC 81 MG EC tablet, Take 1 tablet (81 mg total) by mouth daily., Disp: , Rfl:  .  atorvastatin (LIPITOR) 80 MG tablet, Take 1 tablet (80 mg total) by mouth daily at 6 PM. (Patient taking differently: Take 80 mg by mouth daily. ), Disp: 90 tablet, Rfl: 3 .  Blood Glucose Monitoring Suppl (ACCU-CHEK AVIVA) device, Use as instructed daily., Disp: 1 each, Rfl: 0 .  Blood Pressure Monitoring (ADULT BLOOD PRESSURE CUFF LG) KIT, Check blood pressure once daily, Disp: 1 each, Rfl: 0 .  carvedilol (COREG) 25 MG tablet, TAKE 1 TABLET BY MOUTH 2 TIMES DAILY WITH A MEAL. (Patient taking differently: Take 25 mg by mouth 2 (two) times daily with a meal. ), Disp: 180 tablet, Rfl: 3 .  Continuous Blood Gluc Receiver (FREESTYLE LIBRE 14 DAY READER) DEVI, 1 each by Does not apply route every 14 (fourteen) days., Disp: 1 Device, Rfl: 24 .  docusate sodium (COLACE) 100 MG capsule, Take 1 capsule (100 mg total) by mouth 2 (two) times daily as needed  for mild constipation., Disp: 30 capsule, Rfl: 0 .  Dulaglutide (TRULICITY) 1.70 YF/7.4BS SOPN, Inject 0.75 mg into the skin once a week. (Patient taking differently: Inject 0.75 mg into the skin once a week. Thurs), Disp: 4 pen, Rfl: 3 .  FEROSUL 325 (65 Fe) MG tablet, TAKE (1) TABLET BY MOUTH EVERY OTHER DAY., Disp: 45 tablet, Rfl: 1 .  fluticasone (FLONASE) 50 MCG/ACT nasal spray, Place 2 sprays into both nostrils daily as needed for allergies or rhinitis. (Patient not taking: Reported on 03/22/2018), Disp: 48 g, Rfl: 1 .  Fluticasone-Salmeterol (ADVAIR DISKUS) 250-50 MCG/DOSE AEPB, Inhale 1 puff into the lungs 2 (two) times daily., Disp: 180 each, Rfl: 3 .  glucose blood (ACCU-CHEK AVIVA) test strip, Use as instructed daily, Disp: 100 each, Rfl: 12 .  glucose blood (TRUE METRIX BLOOD GLUCOSE TEST) test strip, Use as instructed, Disp: 100 each, Rfl: 12 .  hydrALAZINE (APRESOLINE) 100 MG tablet, Take 1 tablet (100 mg total) by mouth 3 (three) times daily., Disp: 270 tablet, Rfl: 1 .  hydrOXYzine (ATARAX/VISTARIL) 25 MG tablet, Take 1 tablet (25 mg total) by mouth 2 (two) times daily as needed. (Patient taking differently: Take 25 mg by mouth 2 (two) times daily as needed for itching. ), Disp: 180 tablet, Rfl: 1 .  insulin aspart (NOVOLOG) 100 UNIT/ML FlexPen, Inject  10 Units into the skin 3 (three) times daily with meals. (Patient taking differently: Inject 25 Units into the skin 3 (three) times daily with meals. ), Disp: 15 mL, Rfl: 3 .  Insulin Pen Needle (PEN NEEDLES) 30G X 8 MM MISC, 1 each by Does not apply route 3 (three) times daily before meals. AC for novolog injections, Disp: 100 each, Rfl: 11 .  isosorbide mononitrate (IMDUR) 30 MG 24 hr tablet, TAKE (3) TABLETS BY MOUTH ONCE DAILY., Disp: 90 tablet, Rfl: 3 .  ketotifen (ZADITOR) 0.025 % ophthalmic solution, Place 1 drop into both eyes daily. , Disp: , Rfl:  .  Lancet Devices (ACCU-CHEK SOFTCLIX) lancets, Use as instructed daily., Disp: 1  each, Rfl: 5 .  LANTUS SOLOSTAR 100 UNIT/ML Solostar Pen, INJECT 60 UNITS S.Q. TWICE DAILY. (Patient taking differently: Inject 60 Units into the skin 2 (two) times daily. ), Disp: 24 mL, Rfl: 11 .  loratadine (CLARITIN) 10 MG tablet, Take 1 tablet (10 mg total) by mouth daily., Disp: 90 tablet, Rfl: 1 .  metolazone (ZAROXOLYN) 2.5 MG tablet, Take 1 tablet (2.5 mg total) by mouth daily as needed (swelling)., Disp: 90 tablet, Rfl: 1 .  montelukast (SINGULAIR) 10 MG tablet, TAKE ONE TABLET BY MOUTH AT BEDTIME. (Patient taking differently: Take 10 mg by mouth at bedtime. ), Disp: 90 tablet, Rfl: 1 .  nitroGLYCERIN (NITROSTAT) 0.4 MG SL tablet, Place 1 tablet (0.4 mg total) under the tongue every 5 (five) minutes x 3 doses as needed for chest pain., Disp: 25 tablet, Rfl: 12 .  omeprazole (PRILOSEC) 20 MG capsule, Take 1 capsule (20 mg total) by mouth daily., Disp: 90 capsule, Rfl: 3 .  potassium chloride SA (K-DUR,KLOR-CON) 20 MEQ tablet, Take 1 tablet (20 mEq total) by mouth 2 (two) times daily., Disp: 60 tablet, Rfl: 5 .  pregabalin (LYRICA) 75 MG capsule, Take 1 capsule (75 mg total) by mouth 2 (two) times daily., Disp: 180 capsule, Rfl: 1 .  SENNA PLUS 8.6-50 MG tablet, Take 1 tablet by mouth daily as needed. , Disp: , Rfl: 5 .  silver sulfADIAZINE (SILVADENE) 1 % cream, Apply 1 application topically daily. Apply to right first toe, Disp: 50 g, Rfl: 0 .  ticagrelor (BRILINTA) 90 MG TABS tablet, Take 1 tablet (90 mg total) by mouth 2 (two) times daily., Disp: 180 tablet, Rfl: 3 .  torsemide (DEMADEX) 20 MG tablet, TAKE (2) TABLETS BY MOUTH TWICE DAILY. (Patient taking differently: Take 40 mg by mouth 2 (two) times daily. ), Disp: 120 tablet, Rfl: 5 .  traMADol (ULTRAM) 50 MG tablet, One to Two Tabs Every Six Hours As Needed For Pain (Patient taking differently: Take 50-100 mg by mouth every 6 (six) hours as needed for moderate pain. ), Disp: 60 tablet, Rfl: 0 .  triamcinolone cream (KENALOG) 0.1 %,  Apply 1 application topically 2 (two) times daily., Disp: 80 g, Rfl: 0 .  valACYclovir (VALTREX) 500 MG tablet, TAKE (1) TABLET BY MOUTH EVERY OTHER DAY. (Patient taking differently: Take 500 mg by mouth every other day. ), Disp: 45 tablet, Rfl: 1 .  Vitamin D, Ergocalciferol, (DRISDOL) 50000 units CAPS capsule, TAKE 1 CAPSULE BY MOUTH ONCE A MONTH, Disp: 3 capsule, Rfl: 3  Review of Systems  Social History   Tobacco Use  . Smoking status: Former Smoker    Types: Cigarettes    Last attempt to quit: 10/25/1980    Years since quitting: 37.4  . Smokeless tobacco: Never Used  .  Tobacco comment: quit smoking in 1982  Substance Use Topics  . Alcohol use: Yes    Comment: rarely   Objective:   There were no vitals taken for this visit. There were no vitals filed for this visit.   Physical Exam      Assessment & Plan:           [name], PA-C  [facility]

## 2018-03-30 ENCOUNTER — Other Ambulatory Visit (HOSPITAL_COMMUNITY): Payer: Medicaid Other

## 2018-03-30 ENCOUNTER — Other Ambulatory Visit: Payer: Self-pay | Admitting: Physician Assistant

## 2018-03-30 DIAGNOSIS — IMO0002 Reserved for concepts with insufficient information to code with codable children: Secondary | ICD-10-CM

## 2018-03-30 DIAGNOSIS — E1165 Type 2 diabetes mellitus with hyperglycemia: Secondary | ICD-10-CM

## 2018-03-30 DIAGNOSIS — E118 Type 2 diabetes mellitus with unspecified complications: Principal | ICD-10-CM

## 2018-04-06 ENCOUNTER — Telehealth (HOSPITAL_COMMUNITY): Payer: Self-pay | Admitting: Surgery

## 2018-04-06 NOTE — Telephone Encounter (Signed)
Patient will be discharged at this time from the [facility] program secondary to her refusing the program at this time.

## 2018-04-07 ENCOUNTER — Other Ambulatory Visit: Payer: Self-pay | Admitting: Physician Assistant

## 2018-04-07 DIAGNOSIS — E876 Hypokalemia: Secondary | ICD-10-CM

## 2018-04-07 DIAGNOSIS — E1142 Type 2 diabetes mellitus with diabetic polyneuropathy: Secondary | ICD-10-CM

## 2018-04-07 DIAGNOSIS — J9601 Acute respiratory failure with hypoxia: Secondary | ICD-10-CM

## 2018-04-07 DIAGNOSIS — J4530 Mild persistent asthma, uncomplicated: Secondary | ICD-10-CM

## 2018-04-07 DIAGNOSIS — L299 Pruritus, unspecified: Secondary | ICD-10-CM

## 2018-04-07 DIAGNOSIS — Z794 Long term (current) use of insulin: Secondary | ICD-10-CM

## 2018-04-07 DIAGNOSIS — J301 Allergic rhinitis due to pollen: Secondary | ICD-10-CM

## 2018-04-07 DIAGNOSIS — I5032 Chronic diastolic (congestive) heart failure: Secondary | ICD-10-CM

## 2018-04-11 ENCOUNTER — Ambulatory Visit (INDEPENDENT_AMBULATORY_CARE_PROVIDER_SITE_OTHER): Payer: Medicaid Other | Admitting: Physician Assistant

## 2018-04-11 ENCOUNTER — Encounter: Payer: Self-pay | Admitting: Physician Assistant

## 2018-04-11 VITALS — BP 138/50 | HR 78 | Temp 97.6°F | Resp 20 | Ht 68.0 in | Wt 298.0 lb

## 2018-04-11 DIAGNOSIS — R5381 Other malaise: Secondary | ICD-10-CM | POA: Diagnosis not present

## 2018-04-11 DIAGNOSIS — R2681 Unsteadiness on feet: Secondary | ICD-10-CM | POA: Diagnosis not present

## 2018-04-11 DIAGNOSIS — L299 Pruritus, unspecified: Secondary | ICD-10-CM

## 2018-04-11 DIAGNOSIS — M1712 Unilateral primary osteoarthritis, left knee: Secondary | ICD-10-CM

## 2018-04-11 DIAGNOSIS — I5032 Chronic diastolic (congestive) heart failure: Secondary | ICD-10-CM

## 2018-04-11 DIAGNOSIS — N184 Chronic kidney disease, stage 4 (severe): Secondary | ICD-10-CM

## 2018-04-11 DIAGNOSIS — R0600 Dyspnea, unspecified: Secondary | ICD-10-CM

## 2018-04-11 DIAGNOSIS — R0609 Other forms of dyspnea: Secondary | ICD-10-CM

## 2018-04-11 DIAGNOSIS — J9811 Atelectasis: Secondary | ICD-10-CM

## 2018-04-11 MED ORDER — HYDROXYZINE HCL 50 MG PO TABS: 50.0000 mg | ORAL_TABLET | Freq: Two times a day (BID) | ORAL | 1 refills | Status: DC | PRN

## 2018-04-11 NOTE — Progress Notes (Signed)
Patient: [name] Female    DOB: [birth year]   62 y.o.   MRN: [id number] Visit Date: 04/11/2018  Today's Provider: [name], PA-C   Chief Complaint  Patient presents with  . Abdominal Pain   Subjective:     [name] is a 62 yr old female with multiple chronic health issues that presents to the office today with abdominal pain. She reports it is on the left side. It has been present for about 3 months. It only bothers her when she goes from sitting to standing or changing positions. Rest makes it better. She has not taken anything for the pain. She denies diarrhea, constipation, GERD.   She also reports worsening DOE. She reports weights being stable. She does have CHF and is followed by the HF clinic, except she missed her most recent appointment with them. She also has interstitial lung disease as well complicating the situation. She reports using Advair twice daily. Has nebulizer for prn use. She has been seen previously by Dr. Ashby Dawes in April, but has not followed up.   She also reports worsening gait stability, feelings of being off balance and weakness in her legs. She does report increasing knee pain in the left knee as well. Was previously seen by an orthopedist and reports having arthroscopic surgery previously. He has told her then she needed TKR but she declined.   She was also seen today by Dr. Abigail Butts, nephrology, for CKD stage 4.  In the interim between visits she was also seen by vascular surgery for embolism to the toes. She underwent lower extremity arteriogram and CTA abd/chest. Had a bump in creatinine secondary to contrast that was brought back to baseline with fluids.     Allergies  Allergen Reactions  . Shellfish Allergy Anaphylaxis and Swelling  . Diazepam Other (See Comments)    "felt like out of body experience"  . Morphine And Related Itching     Current Outpatient Medications:  .  ACCU-CHEK AVIVA PLUS test strip, CHECK BLOOD  SUGAR THREE TIMES DAILY & RECORD., Disp: 400 each, Rfl: 3 .  albuterol (PROVENTIL) (2.5 MG/3ML) 0.083% nebulizer solution, Take 3 mLs (2.5 mg total) by nebulization every 6 (six) hours as needed for wheezing or shortness of breath., Disp: 150 mL, Rfl: 1 .  allopurinol (ZYLOPRIM) 100 MG tablet, TAKE (2) TABLETS BY MOUTH ONCE DAILY. (Patient taking differently: Take 200 mg by mouth daily. ), Disp: 180 tablet, Rfl: 1 .  aspirin EC 81 MG EC tablet, Take 1 tablet (81 mg total) by mouth daily., Disp: , Rfl:  .  atorvastatin (LIPITOR) 80 MG tablet, Take 1 tablet (80 mg total) by mouth daily at 6 PM. (Patient taking differently: Take 80 mg by mouth daily. ), Disp: 90 tablet, Rfl: 3 .  Blood Glucose Monitoring Suppl (ACCU-CHEK AVIVA) device, Use as instructed daily., Disp: 1 each, Rfl: 0 .  Blood Pressure Monitoring (ADULT BLOOD PRESSURE CUFF LG) KIT, Check blood pressure once daily, Disp: 1 each, Rfl: 0 .  carvedilol (COREG) 25 MG tablet, TAKE 1 TABLET BY MOUTH 2 TIMES DAILY WITH A MEAL. (Patient taking differently: Take 25 mg by mouth 2 (two) times daily with a meal. ), Disp: 180 tablet, Rfl: 3 .  docusate sodium (COLACE) 100 MG capsule, Take 1 capsule (100 mg total) by mouth 2 (two) times daily as needed for mild constipation., Disp: 30 capsule, Rfl: 0 .  FEROSUL 325 (65 Fe) MG tablet, TAKE (  1) TABLET BY MOUTH EVERY OTHER DAY., Disp: 45 tablet, Rfl: 1 .  fluticasone (FLONASE) 50 MCG/ACT nasal spray, Place 2 sprays into both nostrils daily as needed for allergies or rhinitis., Disp: 48 g, Rfl: 1 .  Fluticasone-Salmeterol (ADVAIR DISKUS) 250-50 MCG/DOSE AEPB, Inhale 1 puff into the lungs 2 (two) times daily., Disp: 180 each, Rfl: 3 .  hydrALAZINE (APRESOLINE) 100 MG tablet, Take 1 tablet (100 mg total) by mouth 3 (three) times daily., Disp: 270 tablet, Rfl: 1 .  hydrOXYzine (ATARAX/VISTARIL) 25 MG tablet, Take 1 tablet (25 mg total) by mouth 2 (two) times daily as needed for itching., Disp: 180 tablet, Rfl:  1 .  insulin aspart (NOVOLOG FLEXPEN) 100 UNIT/ML FlexPen, Inject 25 Units into the skin 3 (three) times daily with meals., Disp: 15 mL, Rfl: 11 .  Insulin Pen Needle (PEN NEEDLES) 30G X 8 MM MISC, 1 each by Does not apply route 3 (three) times daily before meals. AC for novolog injections, Disp: 100 each, Rfl: 11 .  isosorbide mononitrate (IMDUR) 30 MG 24 hr tablet, TAKE (3) TABLETS BY MOUTH ONCE DAILY., Disp: 90 tablet, Rfl: 3 .  ketotifen (ZADITOR) 0.025 % ophthalmic solution, Place 1 drop into both eyes daily. , Disp: , Rfl:  .  Lancet Devices (ACCU-CHEK SOFTCLIX) lancets, Use as instructed daily., Disp: 1 each, Rfl: 5 .  LANTUS SOLOSTAR 100 UNIT/ML Solostar Pen, INJECT 60 UNITS S.Q. TWICE DAILY. (Patient taking differently: Inject 60 Units into the skin 2 (two) times daily. ), Disp: 24 mL, Rfl: 11 .  loratadine (CLARITIN) 10 MG tablet, TAKE 1 TABLET BY MOUTH ONCE DAILY., Disp: 90 tablet, Rfl: 1 .  metolazone (ZAROXOLYN) 2.5 MG tablet, TAKE (1) TABLET BY MOUTH DAILY AS NEEDED FOR SWELLING., Disp: 90 tablet, Rfl: 0 .  montelukast (SINGULAIR) 10 MG tablet, TAKE ONE TABLET BY MOUTH AT BEDTIME., Disp: 90 tablet, Rfl: 1 .  nitroGLYCERIN (NITROSTAT) 0.4 MG SL tablet, Place 1 tablet (0.4 mg total) under the tongue every 5 (five) minutes x 3 doses as needed for chest pain., Disp: 25 tablet, Rfl: 12 .  omeprazole (PRILOSEC) 20 MG capsule, Take 1 capsule (20 mg total) by mouth daily., Disp: 90 capsule, Rfl: 3 .  potassium chloride SA (K-DUR,KLOR-CON) 20 MEQ tablet, TAKE 1 TABLET BY MOUTH TWICE DAILY, Disp: 180 tablet, Rfl: 1 .  pregabalin (LYRICA) 75 MG capsule, Take 1 capsule (75 mg total) by mouth 2 (two) times daily., Disp: 180 capsule, Rfl: 1 .  PROAIR HFA 108 (90 Base) MCG/ACT inhaler, INHALE 2 PUFFS BY MOUTH EVERY 4 HOURS AS NEEDED FOR SHORTNESS OF BREATH, Disp: 8.5 g, Rfl: 5 .  SENNA PLUS 8.6-50 MG tablet, Take 1 tablet by mouth daily as needed. , Disp: , Rfl: 5 .  silver sulfADIAZINE (SILVADENE) 1  % cream, Apply 1 application topically daily. Apply to right first toe, Disp: 50 g, Rfl: 0 .  ticagrelor (BRILINTA) 90 MG TABS tablet, Take 1 tablet (90 mg total) by mouth 2 (two) times daily., Disp: 180 tablet, Rfl: 3 .  torsemide (DEMADEX) 20 MG tablet, TAKE (2) TABLETS BY MOUTH TWICE DAILY. (Patient taking differently: Take 40 mg by mouth 2 (two) times daily. ), Disp: 120 tablet, Rfl: 5 .  traMADol (ULTRAM) 50 MG tablet, One to Two Tabs Every Six Hours As Needed For Pain (Patient taking differently: Take 50-100 mg by mouth every 6 (six) hours as needed for moderate pain. ), Disp: 60 tablet, Rfl: 0 .  triamcinolone cream (KENALOG)  0.1 %, Apply 1 application topically 2 (two) times daily., Disp: 80 g, Rfl: 0 .  TRULICITY 0.62 BJ/6.2GB SOPN, INJECT 0.75MG INTO THE SKIN ONCE WEEKLY, Disp: 2 mL, Rfl: 5 .  valACYclovir (VALTREX) 500 MG tablet, TAKE (1) TABLET BY MOUTH EVERY OTHER DAY. (Patient taking differently: Take 500 mg by mouth every other day. ), Disp: 45 tablet, Rfl: 1 .  Vitamin D, Ergocalciferol, (DRISDOL) 50000 units CAPS capsule, TAKE 1 CAPSULE BY MOUTH ONCE A MONTH, Disp: 3 capsule, Rfl: 3  Review of Systems  Constitutional: Negative.   Respiratory: Negative.   Cardiovascular: Negative.   Gastrointestinal: Positive for abdominal pain. Negative for constipation, diarrhea, nausea and vomiting.  Genitourinary: Negative.   Musculoskeletal: Positive for arthralgias and gait problem.  Neurological: Positive for weakness and numbness.    Social History   Tobacco Use  . Smoking status: Former Smoker    Types: Cigarettes    Last attempt to quit: 10/25/1980    Years since quitting: 37.4  . Smokeless tobacco: Never Used  . Tobacco comment: quit smoking in 1982  Substance Use Topics  . Alcohol use: Yes    Comment: rarely   Objective:   BP (!) 138/50 (BP Location: Left Arm, Patient Position: Sitting, Cuff Size: Large)   Pulse 78   Temp 97.6 F (36.4 C) (Oral)   Resp 20   Ht 5' 8"   (1.727 m)   Wt 298 lb (135.2 kg)   SpO2 99%   BMI 45.31 kg/m  Vitals:   04/11/18 1558  BP: (!) 138/50  Pulse: 78  Resp: 20  Temp: 97.6 F (36.4 C)  TempSrc: Oral  SpO2: 99%  Weight: 298 lb (135.2 kg)  Height: 5' 8"  (1.727 m)     Physical Exam  Constitutional: She appears well-developed and well-nourished. No distress.  HENT:  Head: Normocephalic and atraumatic.  Neck: Normal range of motion. Neck supple.  Cardiovascular: Normal rate and regular rhythm. Exam reveals no gallop and no friction rub.  Murmur heard. Pulmonary/Chest: Effort normal and breath sounds normal. No respiratory distress. She has no wheezes. She has no rales.  Abdominal: Soft. Normal appearance and bowel sounds are normal. There is tenderness in the epigastric area, left upper quadrant and left lower quadrant.  Musculoskeletal: She exhibits edema.  Neurological: She is alert.  Skin: She is not diaphoretic.  Vitals reviewed.  CLINICAL DATA:  62 year old with arterial embolization to the right great toe. Evaluate for thoracic aortic aneurysm.  EXAM: CT CHEST, ABDOMEN AND PELVIS WITHOUT CONTRAST  TECHNIQUE: Multidetector CT imaging of the chest, abdomen and pelvis was performed following the standard protocol without IV contrast.  COMPARISON:  CT 02/16/2015 and abdominal CT 2013  FINDINGS: CT CHEST FINDINGS  Cardiovascular: Evaluation of the thoracic aorta is difficult due to the lack of contrast and the configuration of the patient's heart and aorta. The ascending thoracic aorta appears to measure roughly 3.9 cm and this is essentially unchanged from the exam in 2016. Atherosclerotic calcifications involving the thoracic aorta. Patient also has coronary artery calcifications. Proximal descending thoracic aorta measures 3 cm. No significant pericardial fluid.  Mediastinum/Nodes: No evidence for chest lymphadenopathy but limited evaluation of the hilar regions on this noncontrast  examination. No axillary lymph node enlargement.  Lungs/Pleura: Trachea and mainstem bronchi are patent. Stable poorly defined nodular density in the periphery of the right upper lobe roughly measuring 5 mm on sequence 4, image 47. Chronic pleural-based densities along the right major fissure on sequence 4, image  68. New peripheral densities in the posterior right upper lobe sequence 4, image 59. Again noted are bandlike densities and interstitial thickening in both lower lobes that are suggestive for a combination of scarring and atelectasis. Stable pleural-based density along the left major fissure on sequence 4, image 70. No large pleural effusions. New posterior peripheral densities in the left upper lobe are suggestive for atelectasis.  Musculoskeletal: Mild subcutaneous edema. No suspicious bone findings. Sclerosis in the bones may be related to chronic kidney disease.  CT ABDOMEN PELVIS FINDINGS  Hepatobiliary: No acute abnormality to the liver. Gallbladder has been surgically removed. No significant biliary dilatation.  Pancreas: Small focus of low density near the pancreatic head probably represents focal fat and this is adjacent to the portal confluence. No evidence for pancreatic inflammation.  Spleen: Normal in size without focal abnormality.  Adrenals/Urinary Tract: Adrenal glands are normal. Negative for hydronephrosis. Low-density structures in left kidney are suggestive for renal cysts. Urinary bladder is decompressed.  Stomach/Bowel: Stomach is within normal limits. Appendix appears normal. No evidence of bowel wall thickening, distention, or inflammatory changes.  Vascular/Lymphatic: Normal caliber of the abdominal aorta with atherosclerotic calcifications. Few atherosclerotic calcifications in the iliac vessels without aneurysm. Atherosclerotic calcifications in the proximal left femoral arteries. Again noted are few prominent inguinal lymph  nodes. No significant lymph node enlargement in the abdomen or pelvis.  Reproductive: Limited evaluation of the uterus and adnexal structures but suspect tubal ligation clips.  Other: No free fluid in the abdomen or pelvis. Negative for free air.  Musculoskeletal: No acute abnormality. Subcutaneous edema in left groin related to recent arterial catheterization. No evidence for hematoma formation in this area. Evidence for a StarClose device clip anterior to the left common femoral artery.  IMPRESSION: Ectasia of the ascending thoracic aorta measuring up to 3.9 cm and no significant change since 2016. No aneurysm involving the abdominal aorta or iliac arteries.  Aortic Atherosclerosis (ICD10-I70.0).  Pleural-based densities in lungs are suggestive for areas of atelectasis and scarring.  No acute abnormality in the abdomen or pelvis.   Electronically Signed   By: Markus Daft M.D.   On: 03/08/2018 11:24     Assessment & Plan:     1. Gait instability Referral placed for [facility] due to worsening symptoms and deconditioning from multiple hospitalizations through the year.  - Ambulatory referral to Home Health  2. Physical deconditioning See above medical treatment plan. - Ambulatory referral to Home Health  3. Chronic diastolic heart failure (HCC) Continue torsemide 48m BID. Dr. KAbigail Buttschecked labs today. I will f/u pending results.  - [facility]ulatory [facility]. CKD (chronic kidney disease) stage 4, GFR 15-29 ml/min (HCC) See above medical treatment plan. - Ambulatory referral to Home Health  5. Atelectasis of both lungs Worsening lung function. Recommended patient call to scheduled f/u appt with Dr. RAshby Dawes I also gave a sample of Spiriva 2.573m Continue advair 250-50 BID. She is to call if working well and will send in Rx.  - Ambulatory referral to [id number]. DOE (dyspnea on exertion) Multifactorial due to weight, CHF and chronic lung  disease.  - Ambulatory referral to [facility]. Itching Stable. Diagnosis pulled for medication refill. Continue current medical treatment plan. - hydrOXYzine (ATARAX/VISTARIL) 50 MG tablet; Take 1 tablet (50 mg total) by mouth 2 (two) times daily as needed.  Dispense: 180 tablet; Refill: 1  8. Primary osteoarthritis of left knee Worsening. Continue Tylenol prn. Still trying  to optimize sugar so patient can have an eye surgery then may consider referral to Ortho for consideration of treatment options for the knee. May benefit with PT for this as well in the interim.        [name], PA-C  [facility]

## 2018-04-13 ENCOUNTER — Telehealth: Payer: Self-pay | Admitting: Physician Assistant

## 2018-04-13 NOTE — Telephone Encounter (Signed)
Copied from [place] [id number]. Topic: Referral - Request >> Apr 13, 2018 11:29 AM Parke Poisson wrote: Reason for CRM: Pt's insurance(Medicaid) will not cover in home physical therapy.Can pt be referred to PT outside of home ? If so I will need new order

## 2018-04-14 ENCOUNTER — Telehealth: Payer: Self-pay | Admitting: Physician Assistant

## 2018-04-14 DIAGNOSIS — N184 Chronic kidney disease, stage 4 (severe): Secondary | ICD-10-CM

## 2018-04-14 DIAGNOSIS — E1122 Type 2 diabetes mellitus with diabetic chronic kidney disease: Secondary | ICD-10-CM

## 2018-04-14 DIAGNOSIS — Z794 Long term (current) use of insulin: Secondary | ICD-10-CM

## 2018-04-14 MED ORDER — GLUCOSE BLOOD VI STRP: ORAL_STRIP | 12 refills | Status: DC

## 2018-04-14 MED ORDER — ACCU-CHEK GUIDE W/DEVICE KIT: 1.0000 | PACK | Freq: Four times a day (QID) | 0 refills | Status: DC

## 2018-04-14 NOTE — Telephone Encounter (Signed)
Can we call to see if Dr. Marcy Salvo office can fax [place] her lab results please.   Meter sent in

## 2018-04-14 NOTE — Telephone Encounter (Signed)
Please Review

## 2018-04-14 NOTE — Telephone Encounter (Signed)
Pt needing Accucheck Guide Glucose monitor. The one she was using is showing error.  Pt hasn't check BS for 2 weeks.   Please fill at:  [place], [place] -  [address]  [phone] (Phone) [phone] (Fax)   Pt also needing to know her recent  A1C levels.  Thanks, [employer]

## 2018-04-15 ENCOUNTER — Other Ambulatory Visit: Payer: Self-pay | Admitting: Physician Assistant

## 2018-04-15 DIAGNOSIS — L299 Pruritus, unspecified: Secondary | ICD-10-CM

## 2018-04-15 NOTE — Telephone Encounter (Signed)
Pt called wanting to know if we have sent in the new order for her Accucheck Guide Monitor.  She said she needs a new one.  The one she has does not work.  [employer]  terii

## 2018-04-15 NOTE — Telephone Encounter (Signed)
Patient advised RX has been sent to pharmacy. Patient states [facility] said insurance denied coverage for glucometer. Pharmacy should be faxing a PA. Patient advised it will take a few days for Medicaid PA.   Patient is also requesting lab results from Dr. Abigail Butts. Contacted his office they are closed. Will need to call back on Monday to request lab results

## 2018-04-18 ENCOUNTER — Telehealth: Payer: Self-pay | Admitting: Physician Assistant

## 2018-04-18 NOTE — Telephone Encounter (Signed)
Reviewed. Will discuss with patient when she returns to the office since she may not have reliable transportation.

## 2018-04-18 NOTE — Telephone Encounter (Signed)
Please see other telephone encounter.

## 2018-04-18 NOTE — Telephone Encounter (Signed)
Called Dr.Kolloru office and per patient that answered the called reports that she doesn't see any labs done on 04/11/18 but that she is going to check with lab tomorrow and give me a call back.  Thanks,  -Kahari Critzer

## 2018-04-18 NOTE — Telephone Encounter (Signed)
Pt calling requesting to get her results of her blood work that pt had recently. Please advise pt.  Thanks CC

## 2018-04-29 ENCOUNTER — Encounter: Payer: Self-pay | Admitting: Physician Assistant

## 2018-04-29 ENCOUNTER — Ambulatory Visit (INDEPENDENT_AMBULATORY_CARE_PROVIDER_SITE_OTHER): Payer: Medicaid Other | Admitting: Physician Assistant

## 2018-04-29 ENCOUNTER — Telehealth: Payer: Self-pay | Admitting: Physician Assistant

## 2018-04-29 VITALS — BP 158/68 | HR 83 | Temp 97.6°F | Resp 18 | Wt 299.6 lb

## 2018-04-29 DIAGNOSIS — Z23 Encounter for immunization: Secondary | ICD-10-CM

## 2018-04-29 DIAGNOSIS — N184 Chronic kidney disease, stage 4 (severe): Secondary | ICD-10-CM | POA: Diagnosis not present

## 2018-04-29 DIAGNOSIS — E1122 Type 2 diabetes mellitus with diabetic chronic kidney disease: Secondary | ICD-10-CM | POA: Diagnosis not present

## 2018-04-29 DIAGNOSIS — Z794 Long term (current) use of insulin: Secondary | ICD-10-CM

## 2018-04-29 LAB — POCT GLYCOSYLATED HEMOGLOBIN (HGB A1C)
Est. average glucose Bld gHb Est-mCnc: 269
Hemoglobin A1C: 11 % — AB (ref 4.0–5.6)

## 2018-04-29 MED ORDER — LIRAGLUTIDE 18 MG/3ML ~~LOC~~ SOPN: PEN_INJECTOR | SUBCUTANEOUS | 6 refills | Status: DC

## 2018-04-29 NOTE — Telephone Encounter (Signed)
0.6mg  daily, then increase by 0.6mg  per week, so next week will be 1.2 mg then week 3 will be 1.8mg  (max dose)

## 2018-04-29 NOTE — Telephone Encounter (Signed)
Pt called saying [name] gave her samples of Victoza but she does not know how to use it.  She said she does not know how many MG's she is supposed to take  Please advise  Pt's CB# is [phone]  Thanks Con Memos

## 2018-04-29 NOTE — Patient Instructions (Signed)
Victoza/Liraglutide injection What is this medicine? LIRAGLUTIDE (LIR a GLOO tide) is used to improve blood sugar control in adults with type 2 diabetes. This medicine may be used with other diabetes medicines. This drug may also reduce the risk of heart attack or stroke if you have type 2 diabetes and risk factors for heart disease. This medicine may be used for other purposes; ask your health care provider or pharmacist if you have questions. COMMON BRAND NAME(S): Victoza What should I tell my health care provider before I take this medicine? They need to know if you have any of these conditions: -endocrine tumors (MEN 2) or if someone in your family had these tumors -gallbladder disease -high cholesterol -history of alcohol abuse problem -history of pancreatitis -kidney disease or if you are on dialysis -liver disease -previous swelling of the tongue, face, or lips with difficulty breathing, difficulty swallowing, hoarseness, or tightening of the throat -stomach problems -thyroid cancer or if someone in your family had thyroid cancer -an unusual or allergic reaction to liraglutide, other medicines, foods, dyes, or preservatives -pregnant or trying to get pregnant -breast-feeding How should I use this medicine? This medicine is for injection under the skin of your upper leg, stomach area, or upper arm. You will be taught how to prepare and give this medicine. Use exactly as directed. Take your medicine at regular intervals. Do not take it more often than directed. It is important that you put your used needles and syringes in a special sharps container. Do not put them in a trash can. If you do not have a sharps container, call your pharmacist or healthcare provider to get one. A special MedGuide will be given to you by the pharmacist with each prescription and refill. Be sure to read this information carefully each time. Talk to your pediatrician regarding the use of this medicine in  children. Special care may be needed. Overdosage: If you think you have taken too much of this medicine contact a poison control center or emergency room at once. NOTE: This medicine is only for you. Do not share this medicine with others. What if I miss a dose? If you miss a dose, take it as soon as you can. If it is almost time for your next dose, take only that dose. Do not take double or extra doses. What may interact with this medicine? -other medicines for diabetes Many medications may cause changes in blood sugar, these include: -alcohol containing beverages -antiviral medicines for HIV or AIDS -aspirin and aspirin-like drugs -certain medicines for blood pressure, heart disease, irregular heart beat -chromium -diuretics -female hormones, such as estrogens or progestins, birth control pills -fenofibrate -gemfibrozil -isoniazid -lanreotide -female hormones or anabolic steroids -MAOIs like Carbex, Eldepryl, Marplan, Nardil, and Parnate -medicines for weight loss -medicines for allergies, asthma, cold, or cough -medicines for depression, anxiety, or psychotic disturbances -niacin -nicotine -NSAIDs, medicines for pain and inflammation, like ibuprofen or naproxen -octreotide -pasireotide -pentamidine -phenytoin -probenecid -quinolone antibiotics such as ciprofloxacin, levofloxacin, ofloxacin -some herbal dietary supplements -steroid medicines such as prednisone or cortisone -sulfamethoxazole; trimethoprim -thyroid hormones Some medications can hide the warning symptoms of low blood sugar (hypoglycemia). You may need to monitor your blood sugar more closely if you are taking one of these medications. These include: -beta-blockers, often used for high blood pressure or heart problems (examples include atenolol, metoprolol, propranolol) -clonidine -guanethidine -reserpine This list may not describe all possible interactions. Give your health care provider a list of all the  medicines,  herbs, non-prescription drugs, or dietary supplements you use. Also tell them if you smoke, drink alcohol, or use illegal drugs. Some items may interact with your medicine. What should I watch for while using this medicine? Visit your doctor or health care professional for regular checks on your progress. Drink plenty of fluids while taking this medicine. Check with your doctor or health care professional if you get an attack of severe diarrhea, nausea, and vomiting. The loss of too much body fluid can make it dangerous for you to take this medicine. A test called the HbA1C (A1C) will be monitored. This is a simple blood test. It measures your blood sugar control over the last 2 to 3 months. You will receive this test every 3 to 6 months. Learn how to check your blood sugar. Learn the symptoms of low and high blood sugar and how to manage them. Always carry a quick-source of sugar with you in case you have symptoms of low blood sugar. Examples include hard sugar candy or glucose tablets. Make sure others know that you can choke if you eat or drink when you develop serious symptoms of low blood sugar, such as seizures or unconsciousness. They must get medical help at once. Tell your doctor or health care professional if you have high blood sugar. You might need to change the dose of your medicine. If you are sick or exercising more than usual, you might need to change the dose of your medicine. Do not skip meals. Ask your doctor or health care professional if you should avoid alcohol. Many nonprescription cough and cold products contain sugar or alcohol. These can affect blood sugar. Pens should never be shared. Even if the needle is changed, sharing may result in passing of viruses like hepatitis or HIV. Wear a medical ID bracelet or chain, and carry a card that describes your disease and details of your medicine and dosage times. What side effects may I notice from receiving this  medicine? Side effects that you should report to your doctor or health care professional as soon as possible: -allergic reactions like skin rash, itching or hives, swelling of the face, lips, or tongue -breathing problems -diarrhea that continues or is severe -lump or swelling on the neck -severe nausea -signs and symptoms of infection like fever or chills; cough; sore throat; pain or trouble passing urine -signs and symptoms of low blood sugar such as feeling anxious, confusion, dizziness, increased hunger, unusually weak or tired, sweating, shakiness, cold, irritable, headache, blurred vision, fast heartbeat, loss of consciousness -signs and symptoms of kidney injury like trouble passing urine or change in the amount of urine -trouble swallowing -unusual stomach upset or pain -vomiting Side effects that usually do not require medical attention (report to your doctor or health care professional if they continue or are bothersome): -constipation -decreased appetite -diarrhea -fatigue -headache -nausea -pain, redness, or irritation at site where injected -stomach upset -stuffy or runny nose This list may not describe all possible side effects. Call your doctor for medical advice about side effects. You may report side effects to FDA at 1-800-FDA-1088. Where should I keep my medicine? Keep out of the reach of children. Store unopened pen in a refrigerator between 2 and 8 degrees C (36 and 46 degrees F). Do not freeze or use if the medicine has been frozen. Protect from light and excessive heat. After you first use the pen, it can be stored at room temperature between 15 and 30 degrees C (59 and  86 degrees F) or in a refrigerator. Throw away your used pen after 30 days or after the expiration date, whichever comes first. Do not store your pen with the needle attached. If the needle is left on, medicine may leak from the pen. NOTE: This sheet is a summary. It may not cover all possible  information. If you have questions about this medicine, talk to your doctor, pharmacist, or health care provider.  2018 [employer]vier/[employer] Standard (2016-07-16 14:39:40)

## 2018-04-29 NOTE — Telephone Encounter (Signed)
Pt calling back asking about how to take the Victoza.  Pt needing to know before the weekend.  Pt asking for a call  back today.  Thanks, [employer]

## 2018-04-29 NOTE — Progress Notes (Signed)
Patient: [name] Female    DOB: 1956-02-15   62 y.o.   MRN: [id number] Visit Date: 04/29/2018  Today's Provider: [name], PA-C   Chief Complaint  Patient presents with  . Follow-up    T2DM   Subjective:    HPI [name] is a 62 yr old female with multiple chronic issues. Patient is here today to follow-up uncontrolled T2DM. She also has CKD stage 4,chronic Hep C and CHF.   Diabetes Mellitus Type II, Follow-up:   Lab Results  Component Value Date   HGBA1C 11.0 (A) 04/29/2018   HGBA1C 10.2 (H) 01/27/2018   HGBA1C 11.0 (H) 01/10/2018    Last seen for diabetes 3 months ago.  Management since then includes none. She reports good compliance with treatment. She is not having side effects.  Current symptoms include polydipsia and have been stable. Home blood sugar records: Patient reports that her meter is not working  Pertinent Labs:    Component Value Date/Time   CHOL 122 09/13/2016 1245   TRIG 168 (H) 09/13/2016 1245   HDL 36 (L) 09/13/2016 1245   LDLCALC 52 09/13/2016 1245   CREATININE 3.41 (H) 03/07/2018 1139   CREATININE 1.36 (H) 09/23/2016 1451    Wt Readings from Last 3 Encounters:  04/29/18 299 lb 9.6 oz (135.9 kg)  04/11/18 298 lb (135.2 kg)  03/22/18 (!) 303 lb (137.4 kg)   ------------------------------------------------------------------------      Allergies  Allergen Reactions  . Shellfish Allergy Anaphylaxis and Swelling  . Diazepam Other (See Comments)    "felt like out of body experience"  . Morphine And Related Itching     Current Outpatient Medications:  .  albuterol (PROVENTIL) (2.5 MG/3ML) 0.083% nebulizer solution, Take 3 mLs (2.5 mg total) by nebulization every 6 (six) hours as needed for wheezing or shortness of breath., Disp: 150 mL, Rfl: 1 .  allopurinol (ZYLOPRIM) 100 MG tablet, TAKE (2) TABLETS BY MOUTH ONCE DAILY. (Patient taking differently: Take 200 mg by mouth daily. ), Disp: 180 tablet, Rfl: 1 .   aspirin EC 81 MG EC tablet, Take 1 tablet (81 mg total) by mouth daily., Disp: , Rfl:  .  atorvastatin (LIPITOR) 80 MG tablet, Take 1 tablet (80 mg total) by mouth daily at 6 PM. (Patient taking differently: Take 80 mg by mouth daily. ), Disp: 90 tablet, Rfl: 3 .  Blood Glucose Monitoring Suppl (ACCU-CHEK GUIDE) w/Device KIT, 1 kit by Does not apply route 4 (four) times daily., Disp: 1 kit, Rfl: 0 .  Blood Pressure Monitoring (ADULT BLOOD PRESSURE CUFF LG) KIT, Check blood pressure once daily, Disp: 1 each, Rfl: 0 .  carvedilol (COREG) 25 MG tablet, TAKE 1 TABLET BY MOUTH 2 TIMES DAILY WITH A MEAL. (Patient taking differently: Take 25 mg by mouth 2 (two) times daily with a meal. ), Disp: 180 tablet, Rfl: 3 .  docusate sodium (COLACE) 100 MG capsule, Take 1 capsule (100 mg total) by mouth 2 (two) times daily as needed for mild constipation., Disp: 30 capsule, Rfl: 0 .  FEROSUL 325 (65 Fe) MG tablet, TAKE (1) TABLET BY MOUTH EVERY OTHER DAY., Disp: 45 tablet, Rfl: 1 .  fluticasone (FLONASE) 50 MCG/ACT nasal spray, Place 2 sprays into both nostrils daily as needed for allergies or rhinitis., Disp: 48 g, Rfl: 1 .  Fluticasone-Salmeterol (ADVAIR DISKUS) 250-50 MCG/DOSE AEPB, Inhale 1 puff into the lungs 2 (two) times daily., Disp: 180 each, Rfl: 3 .  glucose blood (ACCU-CHEK GUIDE) test strip, To check blood sugar before meals and bedtime, Disp: 400 each, Rfl: 12 .  hydrALAZINE (APRESOLINE) 100 MG tablet, Take 1 tablet (100 mg total) by mouth 3 (three) times daily., Disp: 270 tablet, Rfl: 1 .  hydrOXYzine (ATARAX/VISTARIL) 50 MG tablet, Take 1 tablet (50 mg total) by mouth 2 (two) times daily as needed., Disp: 180 tablet, Rfl: 1 .  insulin aspart (NOVOLOG FLEXPEN) 100 UNIT/ML FlexPen, Inject 25 Units into the skin 3 (three) times daily with meals., Disp: 15 mL, Rfl: 11 .  Insulin Pen Needle (PEN NEEDLES) 30G X 8 MM MISC, 1 each by Does not apply route 3 (three) times daily before meals. AC for novolog  injections, Disp: 100 each, Rfl: 11 .  isosorbide mononitrate (IMDUR) 30 MG 24 hr tablet, TAKE (3) TABLETS BY MOUTH ONCE DAILY., Disp: 90 tablet, Rfl: 3 .  ketotifen (ZADITOR) 0.025 % ophthalmic solution, Place 1 drop into both eyes daily. , Disp: , Rfl:  .  Lancet Devices (ACCU-CHEK SOFTCLIX) lancets, Use as instructed daily., Disp: 1 each, Rfl: 5 .  LANTUS SOLOSTAR 100 UNIT/ML Solostar Pen, INJECT 60 UNITS S.Q. TWICE DAILY. (Patient taking differently: Inject 60 Units into the skin 2 (two) times daily. ), Disp: 24 mL, Rfl: 11 .  loratadine (CLARITIN) 10 MG tablet, TAKE 1 TABLET BY MOUTH ONCE DAILY., Disp: 90 tablet, Rfl: 1 .  metolazone (ZAROXOLYN) 2.5 MG tablet, TAKE (1) TABLET BY MOUTH DAILY AS NEEDED FOR SWELLING., Disp: 90 tablet, Rfl: 0 .  montelukast (SINGULAIR) 10 MG tablet, TAKE ONE TABLET BY MOUTH AT BEDTIME., Disp: 90 tablet, Rfl: 1 .  nitroGLYCERIN (NITROSTAT) 0.4 MG SL tablet, Place 1 tablet (0.4 mg total) under the tongue every 5 (five) minutes x 3 doses as needed for chest pain., Disp: 25 tablet, Rfl: 12 .  omeprazole (PRILOSEC) 20 MG capsule, Take 1 capsule (20 mg total) by mouth daily., Disp: 90 capsule, Rfl: 3 .  potassium chloride SA (K-DUR,KLOR-CON) 20 MEQ tablet, TAKE 1 TABLET BY MOUTH TWICE DAILY, Disp: 180 tablet, Rfl: 1 .  pregabalin (LYRICA) 75 MG capsule, Take 1 capsule (75 mg total) by mouth 2 (two) times daily., Disp: 180 capsule, Rfl: 1 .  PROAIR HFA 108 (90 Base) MCG/ACT inhaler, INHALE 2 PUFFS BY MOUTH EVERY 4 HOURS AS NEEDED FOR SHORTNESS OF BREATH, Disp: 8.5 g, Rfl: 5 .  SENNA PLUS 8.6-50 MG tablet, Take 1 tablet by mouth daily as needed. , Disp: , Rfl: 5 .  silver sulfADIAZINE (SILVADENE) 1 % cream, Apply 1 application topically daily. Apply to right first toe, Disp: 50 g, Rfl: 0 .  ticagrelor (BRILINTA) 90 MG TABS tablet, Take 1 tablet (90 mg total) by mouth 2 (two) times daily., Disp: 180 tablet, Rfl: 3 .  torsemide (DEMADEX) 20 MG tablet, TAKE (2) TABLETS BY  MOUTH TWICE DAILY. (Patient taking differently: Take 40 mg by mouth 2 (two) times daily. ), Disp: 120 tablet, Rfl: 5 .  traMADol (ULTRAM) 50 MG tablet, One to Two Tabs Every Six Hours As Needed For Pain (Patient taking differently: Take 50-100 mg by mouth every 6 (six) hours as needed for moderate pain. ), Disp: 60 tablet, Rfl: 0 .  triamcinolone cream (KENALOG) 0.1 %, APPLY TO AFFECTED AREA TWICE DAILY, Disp: 80 g, Rfl: 0 .  TRULICITY 1.49 FW/2.6VZ SOPN, INJECT 0.75MG INTO THE SKIN ONCE WEEKLY, Disp: 2 mL, Rfl: 5 .  valACYclovir (VALTREX) 500 MG tablet, TAKE (1) TABLET BY MOUTH EVERY  OTHER DAY. (Patient taking differently: Take 500 mg by mouth every other day. ), Disp: 45 tablet, Rfl: 1 .  Vitamin D, Ergocalciferol, (DRISDOL) 50000 units CAPS capsule, TAKE 1 CAPSULE BY MOUTH ONCE A MONTH, Disp: 3 capsule, Rfl: 3  Review of Systems  Constitutional: Positive for fatigue (chronic).  HENT: Negative.   Eyes: Positive for visual disturbance (worsening).  Respiratory: Positive for shortness of breath ("sometimes").   Cardiovascular: Negative for chest pain, palpitations and leg swelling.  Musculoskeletal: Positive for gait problem.  Neurological: Positive for weakness ("legs').    Social History   Tobacco Use  . Smoking status: Former Smoker    Types: Cigarettes    Last attempt to quit: 10/25/1980    Years since quitting: 37.5  . Smokeless tobacco: Never Used  . Tobacco comment: quit smoking in 1982  Substance Use Topics  . Alcohol use: Yes    Comment: rarely   Objective:   BP (!) 158/68 (BP Location: Left Wrist, Patient Position: Sitting, Cuff Size: Normal)   Pulse 83   Temp 97.6 F (36.4 C) (Oral)   Resp 18   Wt 299 lb 9.6 oz (135.9 kg)   SpO2 95%   BMI 45.55 kg/m  Vitals:   04/29/18 1103  BP: (!) 158/68  Pulse: 83  Resp: 18  Temp: 97.6 F (36.4 C)  TempSrc: Oral  SpO2: 95%  Weight: 299 lb 9.6 oz (135.9 kg)     Physical Exam  Constitutional: She appears well-developed  and well-nourished. No distress.  Neck: Normal range of motion. Neck supple.  Cardiovascular: Normal rate and regular rhythm. Exam reveals no gallop and no friction rub.  Murmur heard. Pulmonary/Chest: Effort normal and breath sounds normal. No respiratory distress. She has no wheezes. She has no rales.  Skin: She is not diaphoretic.  Vitals reviewed.      Assessment & Plan:     1. Type 2 diabetes mellitus with stage 4 chronic kidney disease, with long-term current use of insulin (HCC) A1c still not to goal and increased since last time. Patient reports compliance with medications and states she is taking her Trulicity once weekly, Lantus 65 units BID, and Novolog 25 units with meals. However, despite these medications she is still having increases in her A1c. She has previously been referred to Endocrine for her diabetes but unfortunately missed that appt due to being hospitalized with her CHF at the time. I will place a new referral for her as below. Will also stop Trulicity at patient request and try Victoza as noted below.  - POCT glycosylated hemoglobin (Hb A1C) - liraglutide (VICTOZA) 18 MG/3ML SOPN; Start with 0.55m daily and increase by 0.647mper week until max dose of 1.8 mg dose achieved.  Dispense: 1 pen; Refill: 6 - Ambulatory referral to Endocrinology  2. Need for influenza vaccination Flu vaccine given today without complication. Patient sat upright for 15 minutes to check for adverse reaction before being released. - Flu Vaccine QUAD 36+ mos IM       JeMar DaringPA-C  [facility]

## 2018-05-02 NOTE — Telephone Encounter (Signed)
No answer. LM

## 2018-05-03 ENCOUNTER — Encounter: Payer: Self-pay | Admitting: Physician Assistant

## 2018-05-03 ENCOUNTER — Other Ambulatory Visit: Payer: Self-pay | Admitting: Physician Assistant

## 2018-05-03 DIAGNOSIS — I5042 Chronic combined systolic (congestive) and diastolic (congestive) heart failure: Secondary | ICD-10-CM

## 2018-05-04 ENCOUNTER — Telehealth: Payer: Self-pay | Admitting: Physician Assistant

## 2018-05-04 NOTE — Telephone Encounter (Signed)
Yes. Should stop Trulicity and start Victoza as prescribed  Bacigalupo, Dionne Bucy, MD, MPH [facility] 05/04/2018 1:06 PM

## 2018-05-04 NOTE — Telephone Encounter (Signed)
Pharmacist advised.  Thanks,  -Yanai Hobson

## 2018-05-04 NOTE — Telephone Encounter (Signed)
Adam w/ [facility] is needing to know if pt needs to stop the dulaglutide 0.75 mg per 1/2 mil and start on the new Rx - liraglutide (VICTOZA) 18 MG/3ML SOPN that was recently called in.  Please call Adam back at [phone].  Please advise.  Thanks, [employer]

## 2018-05-05 ENCOUNTER — Telehealth: Payer: Self-pay | Admitting: Cardiovascular Disease

## 2018-05-05 NOTE — Telephone Encounter (Signed)
Spoke to patient who is calling because she has become weak in the arms and legs, along with becoming very SOB with walking, worse in the past 2 weeks. She denies CP, swelling or weight gain.  I scheduled an OV with Scott 10/29 for further evaluation.

## 2018-05-05 NOTE — Telephone Encounter (Signed)
New Message          Pt c/o Shortness Of Breath: STAT if SOB developed within the last 24 hours or pt is noticeably SOB on the phone  1. Are you currently SOB (can you hear that pt is SOB on the phone)? YES  2. How long have you been experiencing SOB? Past 2 weeks and gotten worse  3. Are you SOB when sitting or when up moving around? Both  4. Are you currently experiencing any other symptoms? Patient is very weak.

## 2018-05-09 ENCOUNTER — Emergency Department: Payer: Medicare Other

## 2018-05-09 ENCOUNTER — Encounter: Payer: Self-pay | Admitting: Intensive Care

## 2018-05-09 ENCOUNTER — Ambulatory Visit (INDEPENDENT_AMBULATORY_CARE_PROVIDER_SITE_OTHER): Payer: Medicaid Other | Admitting: Physician Assistant

## 2018-05-09 ENCOUNTER — Encounter: Payer: Self-pay | Admitting: Physician Assistant

## 2018-05-09 ENCOUNTER — Other Ambulatory Visit: Payer: Self-pay

## 2018-05-09 ENCOUNTER — Inpatient Hospital Stay
Admission: EM | Admit: 2018-05-09 | Discharge: 2018-05-14 | DRG: 674 | Disposition: A | Discharge status: Home / Self Care | Payer: Medicare Other | Attending: Internal Medicine | Admitting: Internal Medicine

## 2018-05-09 VITALS — BP 120/70 | HR 86 | Temp 97.8°F | Resp 20 | Wt 290.0 lb

## 2018-05-09 DIAGNOSIS — Z992 Dependence on renal dialysis: Secondary | ICD-10-CM | POA: Diagnosis not present

## 2018-05-09 DIAGNOSIS — K219 Gastro-esophageal reflux disease without esophagitis: Secondary | ICD-10-CM | POA: Diagnosis present

## 2018-05-09 DIAGNOSIS — I350 Nonrheumatic aortic (valve) stenosis: Secondary | ICD-10-CM | POA: Diagnosis present

## 2018-05-09 DIAGNOSIS — M109 Gout, unspecified: Secondary | ICD-10-CM | POA: Diagnosis present

## 2018-05-09 DIAGNOSIS — Z7982 Long term (current) use of aspirin: Secondary | ICD-10-CM

## 2018-05-09 DIAGNOSIS — N2581 Secondary hyperparathyroidism of renal origin: Secondary | ICD-10-CM | POA: Diagnosis present

## 2018-05-09 DIAGNOSIS — R0609 Other forms of dyspnea: Secondary | ICD-10-CM | POA: Diagnosis present

## 2018-05-09 DIAGNOSIS — E1122 Type 2 diabetes mellitus with diabetic chronic kidney disease: Secondary | ICD-10-CM | POA: Diagnosis present

## 2018-05-09 DIAGNOSIS — I2510 Atherosclerotic heart disease of native coronary artery without angina pectoris: Secondary | ICD-10-CM | POA: Diagnosis present

## 2018-05-09 DIAGNOSIS — Z6841 Body Mass Index (BMI) 40.0 and over, adult: Secondary | ICD-10-CM

## 2018-05-09 DIAGNOSIS — N186 End stage renal disease: Secondary | ICD-10-CM | POA: Diagnosis not present

## 2018-05-09 DIAGNOSIS — N179 Acute kidney failure, unspecified: Principal | ICD-10-CM | POA: Diagnosis present

## 2018-05-09 DIAGNOSIS — Z885 Allergy status to narcotic agent status: Secondary | ICD-10-CM

## 2018-05-09 DIAGNOSIS — G4733 Obstructive sleep apnea (adult) (pediatric): Secondary | ICD-10-CM | POA: Diagnosis present

## 2018-05-09 DIAGNOSIS — Z87891 Personal history of nicotine dependence: Secondary | ICD-10-CM | POA: Diagnosis not present

## 2018-05-09 DIAGNOSIS — D631 Anemia in chronic kidney disease: Secondary | ICD-10-CM | POA: Diagnosis present

## 2018-05-09 DIAGNOSIS — Z794 Long term (current) use of insulin: Secondary | ICD-10-CM

## 2018-05-09 DIAGNOSIS — R109 Unspecified abdominal pain: Secondary | ICD-10-CM | POA: Diagnosis not present

## 2018-05-09 DIAGNOSIS — I120 Hypertensive chronic kidney disease with stage 5 chronic kidney disease or end stage renal disease: Secondary | ICD-10-CM | POA: Diagnosis not present

## 2018-05-09 DIAGNOSIS — A084 Viral intestinal infection, unspecified: Secondary | ICD-10-CM | POA: Diagnosis present

## 2018-05-09 DIAGNOSIS — Z91013 Allergy to seafood: Secondary | ICD-10-CM

## 2018-05-09 DIAGNOSIS — R197 Diarrhea, unspecified: Secondary | ICD-10-CM | POA: Diagnosis not present

## 2018-05-09 DIAGNOSIS — T82868A Thrombosis of vascular prosthetic devices, implants and grafts, initial encounter: Secondary | ICD-10-CM | POA: Diagnosis not present

## 2018-05-09 DIAGNOSIS — I252 Old myocardial infarction: Secondary | ICD-10-CM

## 2018-05-09 DIAGNOSIS — E876 Hypokalemia: Secondary | ICD-10-CM | POA: Diagnosis present

## 2018-05-09 DIAGNOSIS — E785 Hyperlipidemia, unspecified: Secondary | ICD-10-CM | POA: Diagnosis present

## 2018-05-09 DIAGNOSIS — R1084 Generalized abdominal pain: Secondary | ICD-10-CM

## 2018-05-09 DIAGNOSIS — Z888 Allergy status to other drugs, medicaments and biological substances status: Secondary | ICD-10-CM

## 2018-05-09 DIAGNOSIS — I132 Hypertensive heart and chronic kidney disease with heart failure and with stage 5 chronic kidney disease, or end stage renal disease: Secondary | ICD-10-CM | POA: Diagnosis present

## 2018-05-09 DIAGNOSIS — Z79899 Other long term (current) drug therapy: Secondary | ICD-10-CM

## 2018-05-09 DIAGNOSIS — Z7951 Long term (current) use of inhaled steroids: Secondary | ICD-10-CM

## 2018-05-09 DIAGNOSIS — M1A39X0 Chronic gout due to renal impairment, multiple sites, without tophus (tophi): Secondary | ICD-10-CM

## 2018-05-09 DIAGNOSIS — E860 Dehydration: Secondary | ICD-10-CM | POA: Diagnosis present

## 2018-05-09 DIAGNOSIS — Z955 Presence of coronary angioplasty implant and graft: Secondary | ICD-10-CM

## 2018-05-09 DIAGNOSIS — F329 Major depressive disorder, single episode, unspecified: Secondary | ICD-10-CM | POA: Diagnosis present

## 2018-05-09 DIAGNOSIS — B182 Chronic viral hepatitis C: Secondary | ICD-10-CM | POA: Diagnosis present

## 2018-05-09 DIAGNOSIS — R799 Abnormal finding of blood chemistry, unspecified: Secondary | ICD-10-CM

## 2018-05-09 DIAGNOSIS — J45909 Unspecified asthma, uncomplicated: Secondary | ICD-10-CM | POA: Diagnosis present

## 2018-05-09 DIAGNOSIS — I5042 Chronic combined systolic (congestive) and diastolic (congestive) heart failure: Secondary | ICD-10-CM | POA: Diagnosis present

## 2018-05-09 LAB — URINALYSIS, COMPLETE (UACMP) WITH MICROSCOPIC
Bilirubin Urine: NEGATIVE
Glucose, UA: NEGATIVE mg/dL
Hgb urine dipstick: NEGATIVE
Ketones, ur: NEGATIVE mg/dL
Leukocytes, UA: NEGATIVE
Nitrite: NEGATIVE
Protein, ur: NEGATIVE mg/dL
Specific Gravity, Urine: 1.009 (ref 1.005–1.030)
pH: 5 (ref 5.0–8.0)

## 2018-05-09 LAB — MAGNESIUM: Magnesium: 1.2 mg/dL — ABNORMAL LOW (ref 1.7–2.4)

## 2018-05-09 LAB — CBC
HCT: 27.4 % — ABNORMAL LOW (ref 36.0–46.0)
Hemoglobin: 8.6 g/dL — ABNORMAL LOW (ref 12.0–15.0)
MCH: 27.6 pg (ref 26.0–34.0)
MCHC: 31.4 g/dL (ref 30.0–36.0)
MCV: 87.8 fL (ref 80.0–100.0)
Platelets: 248 10*3/uL (ref 150–400)
RBC: 3.12 MIL/uL — ABNORMAL LOW (ref 3.87–5.11)
RDW: 16.4 % — ABNORMAL HIGH (ref 11.5–15.5)
WBC: 11 10*3/uL — ABNORMAL HIGH (ref 4.0–10.5)
nRBC: 0 % (ref 0.0–0.2)

## 2018-05-09 LAB — HEPATIC FUNCTION PANEL
ALT: 19 U/L (ref 0–44)
AST: 22 U/L (ref 15–41)
Albumin: 3.5 g/dL (ref 3.5–5.0)
Alkaline Phosphatase: 147 U/L — ABNORMAL HIGH (ref 38–126)
Bilirubin, Direct: 0.1 mg/dL (ref 0.0–0.2)
Indirect Bilirubin: 0.5 mg/dL (ref 0.3–0.9)
Total Bilirubin: 0.6 mg/dL (ref 0.3–1.2)
Total Protein: 7.8 g/dL (ref 6.5–8.1)

## 2018-05-09 LAB — BASIC METABOLIC PANEL
Anion gap: 13 (ref 5–15)
BUN: 142 mg/dL — ABNORMAL HIGH (ref 6–20)
CO2: 25 mmol/L (ref 22–32)
Calcium: 8.4 mg/dL — ABNORMAL LOW (ref 8.9–10.3)
Chloride: 98 mmol/L (ref 98–111)
Creatinine, Ser: 4.15 mg/dL — ABNORMAL HIGH (ref 0.44–1.00)
GFR calc Af Amer: 12 mL/min — ABNORMAL LOW (ref >60)
GFR calc non Af Amer: 11 mL/min — ABNORMAL LOW (ref >60)
Glucose, Bld: 179 mg/dL — ABNORMAL HIGH (ref 70–99)
Potassium: 3.3 mmol/L — ABNORMAL LOW (ref 3.5–5.1)
Sodium: 136 mmol/L (ref 135–145)

## 2018-05-09 LAB — TROPONIN I: Troponin I: <0.03 ng/mL (ref <0.03)

## 2018-05-09 LAB — LIPASE, BLOOD: Lipase: 52 U/L — ABNORMAL HIGH (ref 11–51)

## 2018-05-09 MED ORDER — FENTANYL CITRATE (PF) 100 MCG/2ML IJ SOLN
50.0000 ug | Freq: Once | INTRAMUSCULAR | Status: AC
  Administered 2018-05-09: 50 ug via INTRAVENOUS
  Filled 2018-05-09: qty 2

## 2018-05-09 MED ORDER — ACETAMINOPHEN 500 MG PO TABS
1000.0000 mg | ORAL_TABLET | Freq: Once | ORAL | Status: AC
  Administered 2018-05-10: 1000 mg via ORAL
  Filled 2018-05-09: qty 2

## 2018-05-09 MED ORDER — ONDANSETRON HCL 4 MG/2ML IJ SOLN
4.0000 mg | Freq: Once | INTRAMUSCULAR | Status: AC
  Administered 2018-05-09: 4 mg via INTRAVENOUS
  Filled 2018-05-09: qty 2

## 2018-05-09 MED ORDER — SODIUM CHLORIDE 0.9 % IV BOLUS
1000.0000 mL | Freq: Once | INTRAVENOUS | Status: AC
  Administered 2018-05-09: 1000 mL via INTRAVENOUS

## 2018-05-09 NOTE — ED Provider Notes (Signed)
[facility] Emergency Department Provider Note  ____________________________________________  Time seen: Approximately 9:46 PM  I have reviewed the triage vital signs and the nursing notes.   HISTORY  Chief Complaint Chest Pain and Abdominal Pain   HPI [name] is a 62 y.o. female with a history of CAD, CHF with preserved EF, chronic kidney disease, diabetes, chronic hepatitis C who presents to the emergency department for evaluation of abdominal pain and diarrhea.  Patient reports 3 days of 10-15 daily episodes of watery diarrhea.  She reports that yesterday she noted blood in the stool however that has resolved.  The stool has been dark in color which is not sure if he has been black.  No prior history of GI bleed.  She is on Brilinta.  She denies any recent antibiotic use or history of C. difficile.  She is also complaining of a dull achy constant left-sided abdominal pain for 3 days associated with nausea but no vomiting.  No fever or chills, no urinary symptoms.  The abdominal pain does not radiate to her back.  Patient reports mild intermittent chest pain that she describes as a sharp stabbing pain in the center of her chest that occurs when she drinks or eats something and lasts a few seconds and resolved with no intervention.  No chest pain at this time.   Past Medical History:  Diagnosis Date  . Anemia   . Aortic stenosis    Echo 8/18: mean 13, peak 28, LVOT/AV mean velocity 0.51  . Arthritis   . Asthma    As a child   . Bronchitis   . CAD (coronary artery disease)    a. 09/2016: 50% Ost 1st Mrg stenosis, 50% 2nd Mrg stenosis, 20% Mid-Cx, 95% Prox LAD, 40% mid-LAD, and 10% dist-LAD stenosis. Staged PCI with DES to Prox-LAD.   [name] Chronic combined systolic and diastolic CHF (congestive heart failure) ([place]) 2011   echo 2/18: EF 55-60, normal wall motion, grade 2 diastolic dysfunction, trivial AI // echo 3/18: Septal and apical HK, EF 45-50, normal  wall motion, trivial AI, mild LAE, PASP 38 // echo 8/18: EF 60-65, normal wall motion, grade 1 diastolic dysfunction, calcified aortic valve leaflets, mild aortic stenosis (mean 13, peak 28, LVOT/AV mean velocity 0.51), mild AI, moderate MAC, mild LAE, trivial TR   . Chronic kidney disease    STAGE 4  . Depression   . Diabetes mellitus Dx 1989  . GERD (gastroesophageal reflux disease)   . Gout   . Hepatitis C Dx 2013  . Hypertension Dx 1989  . Myocardial infarction ([place]) 07/2015  . Obesity   . Obstructive sleep apnea   . Pancreatitis 2013  . Pneumonia   . Refusal of blood transfusions as patient is Jehovah's Witness   . Tendinitis   . Tremors of nervous system    LEFT HAND  . Ulcer 2010    Patient Active Problem List   Diagnosis Date Noted  . Colon cancer screening   . LGSIL on Pap smear of cervix 01/27/2018  . Gout 12/25/2017  . AKI (acute kidney injury) ([place]) 12/24/2017  . Hyperglycemia 12/24/2017  . Chronic diastolic heart failure ([place]) 09/30/2017  . Lymphedema 09/30/2017  . Aortic stenosis   . Chronic pain of right knee 02/12/2017  . Chronic gout due to renal impairment of multiple sites without tophus 02/12/2017  . Esophageal dysphagia 02/12/2017  . Osteoarthritis 01/22/2017  . Diabetic hyperosmolar non-ketotic state ([place]) 12/13/2016  .  CAD (coronary artery disease) 12/13/2016  . Hyperlipidemia 12/13/2016  . Hyperphosphatemia 12/13/2016  . Asthma 11/29/2015  . Depression 09/05/2015  . GERD (gastroesophageal reflux disease) 03/25/2015  . Environmental allergies 03/14/2015  . Chronic hepatitis C without hepatic coma ([place]) 01/31/2015  . Diabetic neuropathy ([place]) 01/31/2015  . OSA (obstructive sleep apnea) 01/01/2015  . Poor dentition 11/13/2014  . Essential hypertension 10/08/2014  . Obesity 10/08/2014  . CKD (chronic kidney disease) stage 4, GFR 15-29 ml/min (HCC) 10/08/2014  . Diabetes mellitus type 2, uncontrolled, with complications ([place]) 80/32/1224    Class:  Chronic  . Anemia in stage 3 chronic kidney disease ([place]) 03/25/2012    Past Surgical History:  Procedure Laterality Date  . APPLICATION OF WOUND VAC Left 08/14/2017   Procedure: APPLICATION OF WOUND VAC Exchange;  Surgeon: Robert Bellow, MD;  Location: [facility] ORS;  Service: General;  Laterality: Left;  . CHOLECYSTECTOMY    . COLONOSCOPY WITH PROPOFOL N/A 02/03/2018   Procedure: COLONOSCOPY WITH PROPOFOL;  Surgeon: Lin Landsman, MD;  Location: [facility] ENDOSCOPY;  Service: Gastroenterology;  Laterality: N/A;  . CORONARY ANGIOPLASTY  07/2015   STENT  . CORONARY STENT INTERVENTION N/A 09/18/2016   Procedure: Coronary Stent Intervention;  Surgeon: Troy Sine, MD;  Location: [place] CV LAB;  Service: Cardiovascular;  Laterality: N/A;  . DRESSING CHANGE UNDER ANESTHESIA Left 08/15/2017   Procedure: exploration of wound for bleeding;  Surgeon: Robert Bellow, MD;  Location: [facility] ORS;  Service: General;  Laterality: Left;  . ESOPHAGOGASTRODUODENOSCOPY (EGD) WITH PROPOFOL N/A 02/03/2018   Procedure: ESOPHAGOGASTRODUODENOSCOPY (EGD) WITH PROPOFOL;  Surgeon: Lin Landsman, MD;  Location: [facility] ENDOSCOPY;  Service: Gastroenterology;  Laterality: N/A;  . EYE SURGERY    . INCISION AND DRAINAGE ABSCESS Left 08/12/2017   Procedure: INCISION AND DRAINAGE ABSCESS;  Surgeon: Robert Bellow, MD;  Location: [facility] ORS;  Service: General;  Laterality: Left;  . KNEE ARTHROSCOPY    . LEFT HEART CATH N/A 09/18/2016   Procedure: Left Heart Cath;  Surgeon: Troy Sine, MD;  Location: [place] [facility];  Service: Cardiovascular;  Laterality: N/A;  . LEFT HEART CATH AND CORONARY ANGIOGRAPHY N/A 09/16/2016   Procedure: Left Heart Cath and Coronary Angiography;  Surgeon: Burnell Blanks, MD;  Location: [place] [facility];  Service: Cardiovascular;  Laterality: N/A;  . LEFT HEART CATH AND CORONARY ANGIOGRAPHY N/A 04/29/2017   Procedure: LEFT HEART CATH AND CORONARY ANGIOGRAPHY;  Surgeon: Nelva Bush, MD;  Location: [place] [facility];  Service: Cardiovascular;  Laterality: N/A;  . LOWER EXTREMITY ANGIOGRAPHY Right 03/08/2018   Procedure: LOWER EXTREMITY ANGIOGRAPHY;  Surgeon: Katha Cabal, MD;  Location: [place] [facility];  Service: Cardiovascular;  Laterality: Right;  . TUBAL LIGATION    . TUBAL LIGATION      Prior to Admission medications   Medication Sig Start Date End Date Taking? Authorizing Provider  albuterol (PROVENTIL) (2.5 MG/3ML) 0.083% nebulizer solution Take 3 mLs (2.5 mg total) by nebulization every 6 (six) hours as needed for wheezing or shortness of breath. 09/01/17   [name], PA-C  allopurinol (ZYLOPRIM) 100 MG tablet TAKE (2) TABLETS BY MOUTH ONCE DAILY. Patient taking differently: Take 200 mg by mouth daily.  01/06/18   [name], PA-C  aspirin EC 81 MG EC tablet Take 1 tablet (81 mg total) by mouth daily. 09/19/16   Strader, Fransisco Hertz, PA-C  atorvastatin (LIPITOR) 80 MG tablet Take 1 tablet (80 mg total) by  mouth daily at 6 PM. Patient taking differently: Take 80 mg by mouth daily.  09/24/17   [name], PA-C  Blood Glucose Monitoring Suppl (ACCU-CHEK GUIDE) w/Device KIT 1 kit by Does not apply route 4 (four) times daily. 04/14/18   [name], PA-C  Blood Pressure Monitoring (ADULT BLOOD PRESSURE CUFF LG) KIT Check blood pressure once daily 10/14/17   [name], PA-C  carvedilol (COREG) 25 MG tablet TAKE 1 TABLET BY MOUTH 2 TIMES DAILY WITH A MEAL. Patient taking differently: Take 25 mg by mouth 2 (two) times daily with a meal.  10/14/17   Burnette, Clearnce Sorrel, PA-C  docusate sodium (COLACE) 100 MG capsule Take 1 capsule (100 mg total) by mouth 2 (two) times daily as needed for mild constipation. 09/20/17   Gouru, Illene Silver, MD  FEROSUL 325 (65 Fe) MG tablet TAKE (1) TABLET BY MOUTH EVERY OTHER DAY. 01/06/18   [name], PA-C  fluticasone (FLONASE) 50 MCG/ACT nasal spray Place 2 sprays into both  nostrils daily as needed for allergies or rhinitis. 09/28/17   [name], PA-C  Fluticasone-Salmeterol (ADVAIR DISKUS) 250-50 MCG/DOSE AEPB Inhale 1 puff into the lungs 2 (two) times daily. 09/01/17   [name], PA-C  glucose blood (ACCU-CHEK GUIDE) test strip To check blood sugar before meals and bedtime 04/14/18   [name], PA-C  hydrALAZINE (APRESOLINE) 100 MG tablet Take 1 tablet (100 mg total) by mouth 3 (three) times daily. 01/10/18   [name], PA-C  hydrOXYzine (ATARAX/VISTARIL) 50 MG tablet Take 1 tablet (50 mg total) by mouth 2 (two) times daily as needed. 04/11/18   [name], PA-C  insulin aspart (NOVOLOG FLEXPEN) 100 UNIT/ML FlexPen Inject 25 Units into the skin 3 (three) times daily with meals. 03/30/18   [name], PA-C  Insulin Pen Needle (PEN NEEDLES) 30G X 8 MM MISC 1 each by Does not apply route 3 (three) times daily before meals. AC for novolog injections 11/15/17   [name] M, PA-C  isosorbide mononitrate (IMDUR) 30 MG 24 hr tablet TAKE (3) TABLETS BY MOUTH ONCE DAILY. 03/08/18   [name], PA-C  ketotifen (ZADITOR) 0.025 % ophthalmic solution Place 1 drop into both eyes daily.  09/01/17   [provider]  Lancet Devices [facility]) lancets Use as instructed daily. 04/14/17   Charlott Rakes, MD  LANTUS SOLOSTAR 100 UNIT/ML Solostar Pen INJECT 60 UNITS S.Q. TWICE DAILY. Patient taking differently: Inject 60 Units into the skin 2 (two) times daily.  03/01/18   [name], PA-C  liraglutide (VICTOZA) 18 MG/3ML SOPN Start with 0.50m daily and increase by 0.672mper week until max dose of 1.8 mg dose achieved. 04/29/18   BuMar DaringPA-C  loratadine (CLARITIN) 10 MG tablet TAKE 1 TABLET BY MOUTH ONCE DAILY. 04/07/18   BuMar DaringPA-C  metolazone (ZAROXOLYN) 2.5 MG tablet TAKE (1) TABLET BY MOUTH DAILY AS NEEDED FOR SWELLING. 04/07/18   BuMar DaringPA-C    montelukast (SINGULAIR) 10 MG tablet TAKE ONE TABLET BY MOUTH AT BEDTIME. 04/07/18   Burnette, JeClearnce SorrelPA-C  nitroGLYCERIN (NITROSTAT) 0.4 MG SL tablet Place 1 tablet (0.4 mg total) under the tongue every 5 (five) minutes x 3 doses as needed for chest pain. 03/19/17   Barrett, RhEvelene CroonPA-C  omeprazole (PRILOSEC) 20 MG capsule Take 1 capsule (20 mg total) by mouth daily. 09/24/17   BuMar DaringPA-C  potassium  chloride SA (K-DUR,KLOR-CON) 20 MEQ tablet TAKE 1 TABLET BY MOUTH TWICE DAILY 04/07/18   [name], PA-C  pregabalin (LYRICA) 75 MG capsule Take 1 capsule (75 mg total) by mouth 2 (two) times daily. 01/10/18   [name], PA-C  PROAIR HFA 108 (90 Base) MCG/ACT inhaler INHALE 2 PUFFS BY MOUTH EVERY 4 HOURS AS NEEDED FOR SHORTNESS OF BREATH 04/07/18   [name], PA-C  SENNA PLUS 8.6-50 MG tablet Take 1 tablet by mouth daily as needed.  01/10/18   [provider]  silver sulfADIAZINE (SILVADENE) 1 % cream Apply 1 application topically daily. Apply to right first toe 02/17/18   Stegmayer, Janalyn Harder, PA-C  ticagrelor (BRILINTA) 90 MG TABS tablet Take 1 tablet (90 mg total) by mouth 2 (two) times daily. 10/14/17   [name], PA-C  torsemide (DEMADEX) 20 MG tablet TAKE (2) TABLETS BY MOUTH TWICE DAILY. 05/03/18   [name], PA-C  traMADol (ULTRAM) 50 MG tablet One to Two Tabs Every Six Hours As Needed For Pain Patient taking differently: Take 50-100 mg by mouth every 6 (six) hours as needed for moderate pain.  02/17/18   Stegmayer, Joelene Millin A, PA-C  triamcinolone cream (KENALOG) 0.1 % APPLY TO AFFECTED AREA TWICE DAILY 04/15/18   [name], PA-C  valACYclovir (VALTREX) 500 MG tablet TAKE (1) TABLET BY MOUTH EVERY OTHER DAY. Patient taking differently: Take 500 mg by mouth every other day.  01/06/18   [name], PA-C  Vitamin D, Ergocalciferol, (DRISDOL) 50000 units CAPS capsule TAKE 1 CAPSULE BY MOUTH ONCE A MONTH  03/08/18   [name], PA-C  gabapentin (NEURONTIN) 100 MG capsule Take 1 capsule (100 mg total) by mouth 3 (three) times daily. 08/19/17 09/01/17  [name], MD    Allergies Shellfish allergy; Diazepam; and Morphine and related  Family History  Problem Relation Age of Onset  . Colon cancer Mother   . Heart attack Other   . Heart attack Maternal Grandmother   . Hypertension Sister   . Hypertension Brother   . Diabetes Paternal Grandmother   . Breast cancer Neg Hx     Social History Social History   Tobacco Use  . Smoking status: Former Smoker    Types: Cigarettes    Last attempt to quit: 10/25/1980    Years since quitting: 37.5  . Smokeless tobacco: Never Used  . Tobacco comment: quit smoking in 1982  Substance Use Topics  . Alcohol use: Yes    Comment: rarely  . Drug use: Not Currently    Types: "Crack" cocaine    Comment: 08/21/2016 "clean since 05/1998"    Review of Systems  Constitutional: Negative for fever. Eyes: Negative for visual changes. ENT: Negative for sore throat. Neck: No neck pain  Cardiovascular: + chest pain. Respiratory: Negative for shortness of breath. Gastrointestinal: + abdominal pain, nausea, and diarrhea. No vomiting Genitourinary: Negative for dysuria. Musculoskeletal: Negative for back pain. Skin: Negative for rash. Neurological: Negative for headaches, weakness or numbness. Psych: No SI or HI  ____________________________________________   PHYSICAL EXAM:  VITAL SIGNS: ED Triage Vitals  Enc Vitals Group     BP 05/09/18 1709 (!) 143/63     Pulse Rate 05/09/18 1709 84     Resp 05/09/18 1709 18     Temp 05/09/18 1709 98 F (36.7 C)     Temp Source 05/09/18 1709 Oral     SpO2 05/09/18 1709 96 %  Weight 05/09/18 1711 289 lb (131.1 kg)     Height 05/09/18 1711 5' 8"  (1.727 m)     Head Circumference --      Peak Flow --      Pain Score 05/09/18 1711 8     Pain Loc --      Pain Edu? --      Excl. in [place]? --      Constitutional: Alert and oriented. Well appearing and in no apparent distress. HEENT:      Head: Normocephalic and atraumatic.         Eyes: Conjunctivae are normal. Sclera is non-icteric.       Mouth/Throat: Mucous membranes are moist.       Neck: Supple with no signs of meningismus. Cardiovascular: Regular rate and rhythm. No murmurs, gallops, or rubs. 2+ symmetrical distal pulses are present in all extremities. No JVD. Respiratory: Normal respiratory effort. Lungs are clear to auscultation bilaterally. No wheezes, crackles, or rhonchi.  Gastrointestinal: Soft, obese diffuse left sided tenderness to palpation, and non distended with positive bowel sounds. No rebound or guarding.  Rectal exam showing brown stool guaiac negative Musculoskeletal: Nontender with normal range of motion in all extremities. No edema, cyanosis, or erythema of extremities. Neurologic: Normal speech and language. Face is symmetric. Moving all extremities. No gross focal neurologic deficits are appreciated. Skin: Skin is warm, dry and intact. No rash noted. Psychiatric: Mood and affect are normal. Speech and behavior are normal.  ____________________________________________   LABS (all labs ordered are listed, but only abnormal results are displayed)  Labs Reviewed  BASIC METABOLIC PANEL - Abnormal; Notable for the following components:      Result Value   Potassium 3.3 (*)    Glucose, Bld 179 (*)    BUN 142 (*)    Creatinine, Ser 4.15 (*)    Calcium 8.4 (*)    GFR calc non Af Amer 11 (*)    GFR calc Af Amer 12 (*)    All other components within normal limits  CBC - Abnormal; Notable for the following components:   WBC 11.0 (*)    RBC 3.12 (*)    Hemoglobin 8.6 (*)    HCT 27.4 (*)    RDW 16.4 (*)    All other components within normal limits  URINALYSIS, COMPLETE (UACMP) WITH MICROSCOPIC - Abnormal; Notable for the following components:   Color, Urine YELLOW (*)    APPearance CLEAR (*)     Bacteria, UA RARE (*)    All other components within normal limits  C DIFFICILE QUICK SCREEN W PCR REFLEX  TROPONIN I  HEPATIC FUNCTION PANEL  LIPASE, BLOOD  MAGNESIUM   ____________________________________________  EKG  ED ECG REPORT I, Rudene Re, the attending physician, personally viewed and interpreted this ECG.  Normal sinus rhythm, rate of 85, prolonged QTC, normal axis, no ST elevations or depressions.  Unchanged from prior. ____________________________________________  RADIOLOGY  I have personally reviewed the images performed during this visit and I agree with the Radiologist's read.   Interpretation by Radiologist:  Ct Abdomen Pelvis Wo Contrast  Result Date: 05/09/2018 CLINICAL DATA:  Left-sided sharp chest pain beginning yesterday with nausea. Diarrhea. EXAM: CT ABDOMEN AND PELVIS WITHOUT CONTRAST TECHNIQUE: Multidetector CT imaging of the abdomen and pelvis was performed following the standard protocol without IV contrast. COMPARISON:  03/08/2018 and 03/25/2012 FINDINGS: Lower chest: Minimal bibasilar scarring. Hepatobiliary: Previous cholecystectomy. Liver and biliary tree are otherwise within normal. Pancreas: 5 mm lipoma over the  pancreatic head.  No acute changes. Spleen: Normal. Adrenals/Urinary Tract: Adrenal glands are normal. Kidneys are normal in size without hydronephrosis or nephrolithiasis. A few small left renal cysts unchanged. Ureters and bladder are normal. Stomach/Bowel: Stomach and small bowel are normal. Appendix is normal. Minimal diverticulosis of the colon. Vascular/Lymphatic: Minimal calcified plaque over the abdominal aorta no adenopathy. Reproductive: Normal. Evidence of previous bilateral tubal ligation. Other: No free fluid or focal inflammatory change. Musculoskeletal: Minimal degenerative change of the spine and hips. IMPRESSION: No acute findings in the abdomen/pelvis. Few small left renal cysts unchanged. Mild colonic diverticulosis.  Aortic Atherosclerosis (ICD10-I70.0). Electronically Signed   By: Marin Olp M.D.   On: 05/09/2018 22:17   Dg Chest 2 View  Result Date: 05/09/2018 CLINICAL DATA:  LEFT chest pain and nausea since yesterday. History of CHF. EXAM: CHEST - 2 VIEW COMPARISON:  CT chest March 08, 2018 FINDINGS: Cardiomediastinal silhouette is unremarkable for this low inspiratory examination with crowded vasculature markings. Mild bronchitic changes. The lungs are clear without pleural effusions or focal consolidations. Trachea projects midline and there is no pneumothorax. Included soft tissue planes and osseous structures are non-suspicious. IMPRESSION: Mild bronchitic changes. Electronically Signed   By: Elon Alas M.D.   On: 05/09/2018 18:05     ____________________________________________   PROCEDURES  Procedure(s) performed: None Procedures Critical Care performed:  None ____________________________________________   INITIAL IMPRESSION / ASSESSMENT AND PLAN / ED COURSE   62 y.o. female with a history of CAD, CHF with preserved EF, chronic kidney disease, diabetes, chronic hepatitis C who presents to the emergency department for evaluation of abdominal pain and diarrhea.  Patient is well-appearing and in no distress, she has normal vital signs, her abdomen is obese with diffuse left-sided tenderness, no rebound or guarding.  Rectal exam showing brown stool guaiac negative.  Labs show stable GFR with worsening BUN currently at 142, mild hypokalemia, stable hemoglobin, mild leukocytosis, troponin is negative. C. Diff pending. Ddx C. Diff, colitis, diverticulitis, enteritis. Will give IVF, zofran, morphine for pain.     _________________________ 11:23 PM on 05/09/2018 -----------------------------------------  CT with no acute findings.  C. difficile is pending.  Due to elevated BUN and worsening creatinine I spoke with Dr. Candiss Norse from nephrology who recommended admission for IV hydration and  possible dialysis catheter placement.  Will discuss with the hospitalist for admission   As part of my medical decision making, I reviewed the following data within the [place] notes reviewed and incorporated, Labs reviewed , Old chart reviewed, Radiograph reviewed , Discussed with admitting physician , A consult was requested and obtained from this/these consultant(s) Nephrology, Notes from prior ED visits and [place] Controlled Substance Database    Pertinent labs & imaging results that were available during my care of the patient were reviewed by me and considered in my medical decision making (see chart for details).    ____________________________________________   FINAL CLINICAL IMPRESSION(S) / ED DIAGNOSES  Final diagnoses:  Generalized abdominal pain  Diarrhea of presumed infectious origin  Elevated BUN      NEW MEDICATIONS STARTED DURING THIS VISIT:  ED Discharge Orders    None       Note:  This document was prepared using Dragon voice recognition software and may include unintentional dictation errors.    Rudene Re, MD 05/09/18 [phone]

## 2018-05-09 NOTE — Progress Notes (Signed)
Patient: [name] Female    DOB: April 24, 1956   62 y.o.   MRN: [id number] Visit Date: 05/13/2018  Today's Provider: Trinna Post, PA-C   Chief Complaint  Patient presents with  . Abdominal Pain   Subjective:    HPI   Patient has a history of uncontrolled Type II diabetes, CKD IV, HTN, chronic hepatitis C, OSA, CHF Patient here today c/o chest pain, abdominal pain, coughing up blood, nausea, weak and diarrhea. Patient reports symptoms have been present since Thursday night. She reports she has intermittent chest pain for several days. She reports she coughed up a handful of blood with clots. She reports weakness and difficulty walking. She denies vomiting.     Allergies  Allergen Reactions  . Shellfish Allergy Anaphylaxis and Swelling  . Diazepam Other (See Comments)    "felt like out of body experience"  . Morphine And Related Itching    No current facility-administered medications for this visit.  No current outpatient medications on file.  Facility-Administered Medications Ordered in Other Visits:  .  acetaminophen (TYLENOL) tablet 650 mg, 650 mg, Oral, Q6H PRN **OR** acetaminophen (TYLENOL) suppository 650 mg, 650 mg, Rectal, Q6H PRN, Schnier, Dolores Lory, MD .  albuterol (PROVENTIL) (2.5 MG/3ML) 0.083% nebulizer solution 3 mL, 3 mL, Inhalation, Q4H PRN, Schnier, Dolores Lory, MD, 3 mL at 05/11/18 1318 .  allopurinol (ZYLOPRIM) tablet 100 mg, 100 mg, Oral, Daily, Schnier, Dolores Lory, MD, 100 mg at 05/13/18 0849 .  aspirin EC tablet 81 mg, 81 mg, Oral, Daily, Schnier, Dolores Lory, MD, 81 mg at 05/13/18 0849 .  atorvastatin (LIPITOR) tablet 80 mg, 80 mg, Oral, Daily, Schnier, Dolores Lory, MD, 80 mg at 05/13/18 0849 .  bisacodyl (DULCOLAX) EC tablet 5 mg, 5 mg, Oral, Daily PRN, Schnier, Dolores Lory, MD .  carvedilol (COREG) tablet 25 mg, 25 mg, Oral, BID WC, Schnier, Dolores Lory, MD, 25 mg at 05/11/18 1747 .  Chlorhexidine Gluconate Cloth 2 % PADS 6 each, 6 each, Topical, Q0600,  Kolluru, Sarath, MD, 6 each at 05/12/18 2300 .  ciprofloxacin (CIPRO) tablet 500 mg, 500 mg, Oral, Q24H, Salary, Montell D, MD, 500 mg at 05/12/18 1234 .  diphenhydrAMINE (BENADRYL) injection 50 mg, 50 mg, Intravenous, Once, Schnier, Dolores Lory, MD .  ferrous sulfate tablet 325 mg, 325 mg, Oral, Q breakfast, Schnier, Dolores Lory, MD, 325 mg at 05/13/18 0849 .  fluticasone (FLONASE) 50 MCG/ACT nasal spray 2 spray, 2 spray, Each Nare, Daily PRN, Schnier, Dolores Lory, MD .  heparin injection 5,000 Units, 5,000 Units, Subcutaneous, Q8H, Schnier, Dolores Lory, MD, 5,000 Units at 05/13/18 0548 .  hydrALAZINE (APRESOLINE) injection 10 mg, 10 mg, Intravenous, Q4H PRN, Schnier, Dolores Lory, MD .  hydrALAZINE (APRESOLINE) tablet 100 mg, 100 mg, Oral, TID, Schnier, Dolores Lory, MD, 100 mg at 05/12/18 2122 .  HYDROcodone-acetaminophen (NORCO/VICODIN) 5-325 MG per tablet 1-2 tablet, 1-2 tablet, Oral, Q8H PRN, Schnier, Dolores Lory, MD, 2 tablet at 05/13/18 0859 .  hydrocortisone cream 1 %, , Topical, BID, Mody, Sital, MD .  hydrOXYzine (ATARAX/VISTARIL) tablet 50 mg, 50 mg, Oral, Q4H PRN, Schnier, Dolores Lory, MD .  insulin aspart (novoLOG) injection 0-20 Units, 0-20 Units, Subcutaneous, TID WC, Salary, Montell D, MD, 3 Units at 05/13/18 0846 .  insulin aspart (novoLOG) injection 0-5 Units, 0-5 Units, Subcutaneous, QHS, Salary, Montell D, MD, 2 Units at 05/12/18 2121 .  insulin aspart (novoLOG) injection 25 Units, 25 Units, Subcutaneous, TID WC, Salary, Montell  D, MD, 25 Units at 05/12/18 1235 .  insulin glargine (LANTUS) injection 60 Units, 60 Units, Subcutaneous, BID, Salary, Montell D, MD, 60 Units at 05/13/18 0847 .  isosorbide mononitrate (IMDUR) 24 hr tablet 90 mg, 90 mg, Oral, Daily, Schnier, Dolores Lory, MD, 90 mg at 05/10/18 0948 .  lactobacillus (FLORANEX/LACTINEX) granules 1 g, 1 g, Oral, TID WC, Schnier, Dolores Lory, MD, 1 g at 05/13/18 0848 .  loratadine (CLARITIN) tablet 10 mg, 10 mg, Oral, Daily, Schnier, Dolores Lory,  MD, 10 mg at 05/12/18 2123 .  methylPREDNISolone sodium succinate (SOLU-MEDROL) 125 mg/2 mL injection 125 mg, 125 mg, Intravenous, Once, Schnier, Dolores Lory, MD .  mometasone-formoterol [facility]) 200-5 MCG/ACT inhaler 2 puff, 2 puff, Inhalation, BID, Schnier, Dolores Lory, MD, 2 puff at 05/13/18 0849 .  montelukast (SINGULAIR) tablet 10 mg, 10 mg, Oral, QHS, Schnier, Dolores Lory, MD, 10 mg at 05/12/18 2123 .  nitroGLYCERIN (NITROSTAT) SL tablet 0.4 mg, 0.4 mg, Sublingual, Q5 Min x 3 PRN, Schnier, Dolores Lory, MD .  ondansetron (ZOFRAN) tablet 4 mg, 4 mg, Oral, Q6H PRN **OR** ondansetron (ZOFRAN) injection 4 mg, 4 mg, Intravenous, Q6H PRN, Schnier, Dolores Lory, MD .  pantoprazole (PROTONIX) EC tablet 40 mg, 40 mg, Oral, Daily, Schnier, Dolores Lory, MD, 40 mg at 05/13/18 0849 .  pregabalin (LYRICA) capsule 75 mg, 75 mg, Oral, BID, Schnier, Dolores Lory, MD, 75 mg at 05/13/18 0849 .  ticagrelor (BRILINTA) tablet 90 mg, 90 mg, Oral, BID, Schnier, Dolores Lory, MD, 90 mg at 05/13/18 0848 .  traZODone (DESYREL) tablet 25 mg, 25 mg, Oral, QHS PRN, Schnier, Dolores Lory, MD .  valACYclovir (VALTREX) tablet 500 mg, 500 mg, Oral, Beverly Gust, Schnier, Dolores Lory, MD, 500 mg at 05/12/18 0854  Review of Systems  Constitutional: Positive for appetite change, diaphoresis and fatigue.  Respiratory: Positive for cough.   Cardiovascular: Positive for chest pain.  Gastrointestinal: Positive for abdominal distention, abdominal pain, blood in stool, diarrhea, nausea and vomiting.  Neurological: Positive for weakness.    Social History   Tobacco Use  . Smoking status: Former Smoker    Types: Cigarettes    Last attempt to quit: 10/25/1980    Years since quitting: 37.5  . Smokeless tobacco: Never Used  . Tobacco comment: quit smoking in 1982  Substance Use Topics  . Alcohol use: Yes    Comment: rarely   Objective:   BP 120/70 (BP Location: Left Arm, Patient Position: Sitting, Cuff Size: Normal)   Pulse 86   Temp 97.8 F (36.6 C)  (Oral)   Resp 20   Wt 290 lb (131.5 kg)   SpO2 96%   BMI 44.09 kg/m  Vitals:   05/09/18 1607  BP: 120/70  Pulse: 86  Resp: 20  Temp: 97.8 F (36.6 C)  TempSrc: Oral  SpO2: 96%  Weight: 290 lb (131.5 kg)     Physical Exam  Constitutional: She appears well-developed. She appears ill.  Walks slowly and with great effort into the exam room. Is bent over resting her head on the exam table when I enter the room.   Cardiovascular: Normal rate, regular rhythm and normal heart sounds.  Pulmonary/Chest: Effort normal and breath sounds normal.  Neurological: She is alert.  Skin: Skin is warm and dry.  Psychiatric: She has a normal mood and affect. Her behavior is normal.        Assessment & Plan:     1. Diarrhea, unspecified type  Chronically ill woman presenting with above complaints,  mainly diarrhea. At this point, differential is quite broad and I have concern for her renal function and the potential she may need dialysis. She needs thorough workup in the ER and I have sent her there.  Return if symptoms worsen or fail to improve.  The entirety of the information documented in the History of Present Illness, Review of Systems and Physical Exam were personally obtained by me. Portions of this information were initially documented by Lynford Humphrey, CMA and reviewed by me for thoroughness and accuracy.          Trinna Post, PA-C  [name]

## 2018-05-09 NOTE — ED Notes (Signed)
Pt uprite on stretcher in exam room with no distress noted; pt reports right sided HA 10/10 and lower abd pain; MD notified

## 2018-05-09 NOTE — ED Triage Notes (Signed)
Patient c/o L sided, sharp chest pain that started yesterday with nausea. Also c/o diarrhea non stop since saturday

## 2018-05-10 ENCOUNTER — Ambulatory Visit: Payer: Medicaid Other | Admitting: Physician Assistant

## 2018-05-10 ENCOUNTER — Inpatient Hospital Stay
Admission: EM | Disposition: A | Discharge status: Home / Self Care | Payer: Self-pay | Source: Home / Self Care | Attending: Family Medicine

## 2018-05-10 DIAGNOSIS — I120 Hypertensive chronic kidney disease with stage 5 chronic kidney disease or end stage renal disease: Secondary | ICD-10-CM

## 2018-05-10 DIAGNOSIS — I2510 Atherosclerotic heart disease of native coronary artery without angina pectoris: Secondary | ICD-10-CM

## 2018-05-10 DIAGNOSIS — N186 End stage renal disease: Secondary | ICD-10-CM

## 2018-05-10 DIAGNOSIS — E1122 Type 2 diabetes mellitus with diabetic chronic kidney disease: Secondary | ICD-10-CM

## 2018-05-10 DIAGNOSIS — T82868A Thrombosis of vascular prosthetic devices, implants and grafts, initial encounter: Secondary | ICD-10-CM

## 2018-05-10 DIAGNOSIS — R109 Unspecified abdominal pain: Secondary | ICD-10-CM

## 2018-05-10 DIAGNOSIS — R197 Diarrhea, unspecified: Secondary | ICD-10-CM

## 2018-05-10 DIAGNOSIS — Z992 Dependence on renal dialysis: Secondary | ICD-10-CM

## 2018-05-10 HISTORY — PX: DIALYSIS/PERMA CATHETER INSERTION: CATH118288

## 2018-05-10 LAB — C DIFFICILE QUICK SCREEN W PCR REFLEX
C Diff antigen: POSITIVE — AB
C Diff toxin: NEGATIVE

## 2018-05-10 LAB — GASTROINTESTINAL PANEL BY PCR, STOOL (REPLACES STOOL CULTURE)

## 2018-05-10 LAB — BASIC METABOLIC PANEL
Anion gap: 12 (ref 5–15)
BUN: 147 mg/dL — ABNORMAL HIGH (ref 6–20)
CO2: 25 mmol/L (ref 22–32)
Calcium: 7.6 mg/dL — ABNORMAL LOW (ref 8.9–10.3)
Chloride: 102 mmol/L (ref 98–111)
Creatinine, Ser: 3.99 mg/dL — ABNORMAL HIGH (ref 0.44–1.00)
GFR calc Af Amer: 13 mL/min — ABNORMAL LOW (ref >60)
GFR calc non Af Amer: 11 mL/min — ABNORMAL LOW (ref >60)
Glucose, Bld: 195 mg/dL — ABNORMAL HIGH (ref 70–99)
Potassium: 3.1 mmol/L — ABNORMAL LOW (ref 3.5–5.1)
Sodium: 139 mmol/L (ref 135–145)

## 2018-05-10 LAB — CBC
HCT: 26.3 % — ABNORMAL LOW (ref 36.0–46.0)
Hemoglobin: 8.1 g/dL — ABNORMAL LOW (ref 12.0–15.0)
MCH: 27.2 pg (ref 26.0–34.0)
MCHC: 30.8 g/dL (ref 30.0–36.0)
MCV: 88.3 fL (ref 80.0–100.0)
Platelets: 232 10*3/uL (ref 150–400)
RBC: 2.98 MIL/uL — ABNORMAL LOW (ref 3.87–5.11)
RDW: 16.4 % — ABNORMAL HIGH (ref 11.5–15.5)
WBC: 9.7 10*3/uL (ref 4.0–10.5)
nRBC: 0 % (ref 0.0–0.2)

## 2018-05-10 LAB — LACTIC ACID, PLASMA
Lactic Acid, Venous: 0.9 mmol/L (ref 0.5–1.9)
Lactic Acid, Venous: 1.3 mmol/L (ref 0.5–1.9)

## 2018-05-10 LAB — CLOSTRIDIUM DIFFICILE BY PCR, REFLEXED: Toxigenic C. Difficile by PCR: NEGATIVE

## 2018-05-10 LAB — MAGNESIUM: Magnesium: 1.3 mg/dL — ABNORMAL LOW (ref 1.7–2.4)

## 2018-05-10 LAB — POTASSIUM: Potassium: 4.1 mmol/L (ref 3.5–5.1)

## 2018-05-10 LAB — GLUCOSE, CAPILLARY
Glucose-Capillary: 189 mg/dL — ABNORMAL HIGH (ref 70–99)
Glucose-Capillary: 213 mg/dL — ABNORMAL HIGH (ref 70–99)
Glucose-Capillary: 232 mg/dL — ABNORMAL HIGH (ref 70–99)
Glucose-Capillary: 397 mg/dL — ABNORMAL HIGH (ref 70–99)

## 2018-05-10 LAB — PHOSPHORUS: Phosphorus: 5.5 mg/dL — ABNORMAL HIGH (ref 2.5–4.6)

## 2018-05-10 SURGERY — DIALYSIS/PERMA CATHETER INSERTION
Anesthesia: Moderate Sedation

## 2018-05-10 MED ORDER — ALLOPURINOL 100 MG PO TABS
200.0000 mg | ORAL_TABLET | Freq: Every day | ORAL | Status: DC
  Administered 2018-05-10: 200 mg via ORAL
  Filled 2018-05-10: qty 2

## 2018-05-10 MED ORDER — PREGABALIN 75 MG PO CAPS
75.0000 mg | ORAL_CAPSULE | Freq: Two times a day (BID) | ORAL | Status: DC
  Administered 2018-05-10 – 2018-05-13 (×8): 75 mg via ORAL
  Filled 2018-05-10 (×8): qty 1

## 2018-05-10 MED ORDER — METHYLPREDNISOLONE SODIUM SUCC 125 MG IJ SOLR
INTRAMUSCULAR | Status: AC
  Administered 2018-05-10: 125 mg
  Filled 2018-05-10: qty 2

## 2018-05-10 MED ORDER — METHYLPREDNISOLONE SODIUM SUCC 125 MG IJ SOLR
125.0000 mg | Freq: Once | INTRAMUSCULAR | Status: DC
  Filled 2018-05-10: qty 2

## 2018-05-10 MED ORDER — SODIUM CHLORIDE 0.9 % IV SOLN
INTRAVENOUS | Status: DC
  Administered 2018-05-10: 05:00:00 via INTRAVENOUS

## 2018-05-10 MED ORDER — INSULIN ASPART 100 UNIT/ML ~~LOC~~ SOLN
0.0000 [IU] | Freq: Every day | SUBCUTANEOUS | Status: DC
  Administered 2018-05-10: 5 [IU] via SUBCUTANEOUS
  Filled 2018-05-10: qty 1

## 2018-05-10 MED ORDER — INSULIN GLARGINE 100 UNIT/ML ~~LOC~~ SOLN
45.0000 [IU] | Freq: Two times a day (BID) | SUBCUTANEOUS | Status: DC
  Administered 2018-05-10 (×2): 45 [IU] via SUBCUTANEOUS
  Filled 2018-05-10 (×5): qty 0.45

## 2018-05-10 MED ORDER — FENTANYL CITRATE (PF) 100 MCG/2ML IJ SOLN
INTRAMUSCULAR | Status: AC
  Filled 2018-05-10: qty 2

## 2018-05-10 MED ORDER — FLORANEX PO PACK
1.0000 g | PACK | Freq: Three times a day (TID) | ORAL | Status: DC
  Administered 2018-05-11 – 2018-05-13 (×6): 1 g via ORAL
  Filled 2018-05-10 (×15): qty 1

## 2018-05-10 MED ORDER — MONTELUKAST SODIUM 10 MG PO TABS
10.0000 mg | ORAL_TABLET | Freq: Every day | ORAL | Status: DC
  Administered 2018-05-10 – 2018-05-13 (×4): 10 mg via ORAL
  Filled 2018-05-10 (×4): qty 1

## 2018-05-10 MED ORDER — DIPHENHYDRAMINE HCL 50 MG/ML IJ SOLN
50.0000 mg | Freq: Once | INTRAMUSCULAR | Status: DC
  Filled 2018-05-10: qty 1

## 2018-05-10 MED ORDER — PANTOPRAZOLE SODIUM 40 MG PO TBEC
40.0000 mg | DELAYED_RELEASE_TABLET | Freq: Every day | ORAL | Status: DC
  Administered 2018-05-10 – 2018-05-14 (×5): 40 mg via ORAL
  Filled 2018-05-10 (×5): qty 1

## 2018-05-10 MED ORDER — HYDRALAZINE HCL 20 MG/ML IJ SOLN: 10.0000 mg | INTRAMUSCULAR | Status: DC | PRN

## 2018-05-10 MED ORDER — BISACODYL 5 MG PO TBEC
5.0000 mg | DELAYED_RELEASE_TABLET | Freq: Every day | ORAL | Status: DC | PRN
  Filled 2018-05-10: qty 1

## 2018-05-10 MED ORDER — DOCUSATE SODIUM 100 MG PO CAPS
100.0000 mg | ORAL_CAPSULE | Freq: Two times a day (BID) | ORAL | Status: DC
  Administered 2018-05-10: 100 mg via ORAL
  Filled 2018-05-10: qty 1

## 2018-05-10 MED ORDER — ACETAMINOPHEN 325 MG PO TABS: 650.0000 mg | ORAL_TABLET | Freq: Four times a day (QID) | ORAL | Status: DC | PRN

## 2018-05-10 MED ORDER — CEFAZOLIN SODIUM-DEXTROSE 2-4 GM/100ML-% IV SOLN
2.0000 g | INTRAVENOUS | Status: AC
  Filled 2018-05-10: qty 100

## 2018-05-10 MED ORDER — MOMETASONE FURO-FORMOTEROL FUM 200-5 MCG/ACT IN AERO
2.0000 | INHALATION_SPRAY | Freq: Two times a day (BID) | RESPIRATORY_TRACT | Status: DC
  Administered 2018-05-10 – 2018-05-13 (×8): 2 via RESPIRATORY_TRACT
  Filled 2018-05-10 (×2): qty 8.8

## 2018-05-10 MED ORDER — ONDANSETRON HCL 4 MG/2ML IJ SOLN: 4.0000 mg | Freq: Four times a day (QID) | INTRAMUSCULAR | Status: DC | PRN

## 2018-05-10 MED ORDER — MAGNESIUM SULFATE 2 GM/50ML IV SOLN
2.0000 g | Freq: Once | INTRAVENOUS | Status: AC
  Administered 2018-05-10: 2 g via INTRAVENOUS

## 2018-05-10 MED ORDER — MAGNESIUM SULFATE 2 GM/50ML IV SOLN
2.0000 g | Freq: Once | INTRAVENOUS | Status: AC
  Administered 2018-05-10: 2 g via INTRAVENOUS
  Filled 2018-05-10: qty 50

## 2018-05-10 MED ORDER — VALACYCLOVIR HCL 500 MG PO TABS
500.0000 mg | ORAL_TABLET | ORAL | Status: DC
  Administered 2018-05-10 – 2018-05-14 (×3): 500 mg via ORAL
  Filled 2018-05-10 (×3): qty 1

## 2018-05-10 MED ORDER — FENTANYL CITRATE (PF) 100 MCG/2ML IJ SOLN
INTRAMUSCULAR | Status: DC | PRN
  Administered 2018-05-10: 50 ug via INTRAVENOUS

## 2018-05-10 MED ORDER — FERROUS SULFATE 325 (65 FE) MG PO TABS
325.0000 mg | ORAL_TABLET | Freq: Every day | ORAL | Status: DC
  Administered 2018-05-10 – 2018-05-14 (×5): 325 mg via ORAL
  Filled 2018-05-10 (×5): qty 1

## 2018-05-10 MED ORDER — HEPARIN SODIUM (PORCINE) 5000 UNIT/ML IJ SOLN
5000.0000 [IU] | Freq: Three times a day (TID) | INTRAMUSCULAR | Status: DC
  Administered 2018-05-10 – 2018-05-13 (×9): 5000 [IU] via SUBCUTANEOUS
  Filled 2018-05-10 (×12): qty 1

## 2018-05-10 MED ORDER — DIPHENHYDRAMINE HCL 50 MG/ML IJ SOLN
INTRAMUSCULAR | Status: AC
  Administered 2018-05-10: 50 mg
  Filled 2018-05-10: qty 1

## 2018-05-10 MED ORDER — FAMOTIDINE 20 MG PO TABS
40.0000 mg | ORAL_TABLET | Freq: Once | ORAL | Status: AC
  Administered 2018-05-10: 40 mg via ORAL

## 2018-05-10 MED ORDER — LIDOCAINE-EPINEPHRINE (PF) 1 %-1:200000 IJ SOLN
INTRAMUSCULAR | Status: AC
  Filled 2018-05-10: qty 60

## 2018-05-10 MED ORDER — ACETAMINOPHEN 650 MG RE SUPP: 650.0000 mg | Freq: Four times a day (QID) | RECTAL | Status: DC | PRN

## 2018-05-10 MED ORDER — TICAGRELOR 90 MG PO TABS
90.0000 mg | ORAL_TABLET | Freq: Two times a day (BID) | ORAL | Status: DC
  Administered 2018-05-10 – 2018-05-13 (×8): 90 mg via ORAL
  Filled 2018-05-10 (×11): qty 1

## 2018-05-10 MED ORDER — ALLOPURINOL 100 MG PO TABS
100.0000 mg | ORAL_TABLET | Freq: Every day | ORAL | Status: DC
  Administered 2018-05-11 – 2018-05-14 (×4): 100 mg via ORAL
  Filled 2018-05-10 (×4): qty 1

## 2018-05-10 MED ORDER — ISOSORBIDE MONONITRATE ER 30 MG PO TB24
90.0000 mg | ORAL_TABLET | Freq: Every day | ORAL | Status: DC
  Administered 2018-05-10 – 2018-05-14 (×2): 90 mg via ORAL
  Filled 2018-05-10: qty 2
  Filled 2018-05-10: qty 3

## 2018-05-10 MED ORDER — POTASSIUM CHLORIDE CRYS ER 20 MEQ PO TBCR: 20.0000 meq | EXTENDED_RELEASE_TABLET | Freq: Two times a day (BID) | ORAL | Status: DC

## 2018-05-10 MED ORDER — HYDRALAZINE HCL 50 MG PO TABS
100.0000 mg | ORAL_TABLET | Freq: Three times a day (TID) | ORAL | Status: DC
  Administered 2018-05-10 – 2018-05-13 (×7): 100 mg via ORAL
  Filled 2018-05-10 (×7): qty 2

## 2018-05-10 MED ORDER — POTASSIUM CHLORIDE IN NACL 40-0.9 MEQ/L-% IV SOLN
INTRAVENOUS | Status: DC
  Administered 2018-05-10: 75 mL/h via INTRAVENOUS
  Filled 2018-05-10 (×4): qty 1000

## 2018-05-10 MED ORDER — FAMOTIDINE 20 MG PO TABS
ORAL_TABLET | ORAL | Status: AC
  Administered 2018-05-10: 40 mg via ORAL
  Filled 2018-05-10: qty 2

## 2018-05-10 MED ORDER — HYDROXYZINE HCL 25 MG PO TABS: 50.0000 mg | ORAL_TABLET | ORAL | Status: DC | PRN

## 2018-05-10 MED ORDER — MIDAZOLAM HCL 5 MG/5ML IJ SOLN
INTRAMUSCULAR | Status: AC
  Filled 2018-05-10: qty 5

## 2018-05-10 MED ORDER — CHLORHEXIDINE GLUCONATE CLOTH 2 % EX PADS
6.0000 | MEDICATED_PAD | Freq: Every day | CUTANEOUS | Status: DC
  Administered 2018-05-11 – 2018-05-14 (×4): 6 via TOPICAL

## 2018-05-10 MED ORDER — LORATADINE 10 MG PO TABS
10.0000 mg | ORAL_TABLET | Freq: Every day | ORAL | Status: DC
  Administered 2018-05-10 – 2018-05-13 (×4): 10 mg via ORAL
  Filled 2018-05-10 (×4): qty 1

## 2018-05-10 MED ORDER — LIDOCAINE-EPINEPHRINE (PF) 1 %-1:200000 IJ SOLN
INTRAMUSCULAR | Status: AC
  Filled 2018-05-10: qty 30

## 2018-05-10 MED ORDER — INSULIN ASPART 100 UNIT/ML ~~LOC~~ SOLN
0.0000 [IU] | Freq: Three times a day (TID) | SUBCUTANEOUS | Status: DC
  Administered 2018-05-10: 5 [IU] via SUBCUTANEOUS
  Administered 2018-05-10: 3 [IU] via SUBCUTANEOUS
  Filled 2018-05-10 (×2): qty 1

## 2018-05-10 MED ORDER — CARVEDILOL 25 MG PO TABS
25.0000 mg | ORAL_TABLET | Freq: Two times a day (BID) | ORAL | Status: DC
  Administered 2018-05-10 – 2018-05-13 (×4): 25 mg via ORAL
  Filled 2018-05-10 (×4): qty 1

## 2018-05-10 MED ORDER — NITROGLYCERIN 0.4 MG SL SUBL: 0.4000 mg | SUBLINGUAL_TABLET | SUBLINGUAL | Status: DC | PRN

## 2018-05-10 MED ORDER — ASPIRIN EC 81 MG PO TBEC
81.0000 mg | DELAYED_RELEASE_TABLET | Freq: Every day | ORAL | Status: DC
  Administered 2018-05-10 – 2018-05-14 (×5): 81 mg via ORAL
  Filled 2018-05-10 (×5): qty 1

## 2018-05-10 MED ORDER — HYDROCODONE-ACETAMINOPHEN 5-325 MG PO TABS
1.0000 | ORAL_TABLET | Freq: Three times a day (TID) | ORAL | Status: DC | PRN
  Administered 2018-05-10 – 2018-05-13 (×8): 2 via ORAL
  Filled 2018-05-10 (×8): qty 2

## 2018-05-10 MED ORDER — HYDROCODONE-ACETAMINOPHEN 5-325 MG PO TABS: 1.0000 | ORAL_TABLET | ORAL | Status: DC | PRN

## 2018-05-10 MED ORDER — MIDAZOLAM HCL 2 MG/2ML IJ SOLN
INTRAMUSCULAR | Status: DC | PRN
  Administered 2018-05-10: 1 mg via INTRAVENOUS

## 2018-05-10 MED ORDER — FLUTICASONE PROPIONATE 50 MCG/ACT NA SUSP
2.0000 | Freq: Every day | NASAL | Status: DC | PRN
  Filled 2018-05-10: qty 16

## 2018-05-10 MED ORDER — ONDANSETRON HCL 4 MG PO TABS: 4.0000 mg | ORAL_TABLET | Freq: Four times a day (QID) | ORAL | Status: DC | PRN

## 2018-05-10 MED ORDER — PROCHLORPERAZINE EDISYLATE 10 MG/2ML IJ SOLN
10.0000 mg | Freq: Once | INTRAMUSCULAR | Status: AC
  Administered 2018-05-10: 10 mg via INTRAVENOUS
  Filled 2018-05-10: qty 2

## 2018-05-10 MED ORDER — TRAZODONE HCL 50 MG PO TABS: 25.0000 mg | ORAL_TABLET | Freq: Every evening | ORAL | Status: DC | PRN

## 2018-05-10 MED ORDER — HEPARIN (PORCINE) IN NACL 1000-0.9 UT/500ML-% IV SOLN
INTRAVENOUS | Status: AC
  Filled 2018-05-10: qty 500

## 2018-05-10 MED ORDER — MAGNESIUM SULFATE 2 GM/50ML IV SOLN
INTRAVENOUS | Status: AC
  Filled 2018-05-10: qty 50

## 2018-05-10 MED ORDER — HEPARIN SODIUM (PORCINE) 10000 UNIT/ML IJ SOLN
INTRAMUSCULAR | Status: AC
  Filled 2018-05-10: qty 1

## 2018-05-10 MED ORDER — POTASSIUM CHLORIDE 20 MEQ PO PACK
40.0000 meq | PACK | Freq: Once | ORAL | Status: AC
  Administered 2018-05-10: 40 meq via ORAL

## 2018-05-10 MED ORDER — INSULIN GLARGINE 100 UNIT/ML SOLOSTAR PEN: 45.0000 [IU] | PEN_INJECTOR | Freq: Two times a day (BID) | SUBCUTANEOUS | Status: DC

## 2018-05-10 MED ORDER — MAGNESIUM CHLORIDE 64 MG PO TBEC
1.0000 | DELAYED_RELEASE_TABLET | Freq: Two times a day (BID) | ORAL | Status: AC
  Administered 2018-05-10 – 2018-05-11 (×4): 64 mg via ORAL
  Filled 2018-05-10 (×4): qty 1

## 2018-05-10 MED ORDER — ALBUTEROL SULFATE (2.5 MG/3ML) 0.083% IN NEBU
3.0000 mL | INHALATION_SOLUTION | RESPIRATORY_TRACT | Status: DC | PRN
  Administered 2018-05-11: 3 mL via RESPIRATORY_TRACT
  Filled 2018-05-10: qty 3

## 2018-05-10 MED ORDER — ATORVASTATIN CALCIUM 20 MG PO TABS
80.0000 mg | ORAL_TABLET | Freq: Every day | ORAL | Status: DC
  Administered 2018-05-10 – 2018-05-13 (×4): 80 mg via ORAL
  Filled 2018-05-10 (×4): qty 4

## 2018-05-10 MED ORDER — POTASSIUM CHLORIDE 20 MEQ PO PACK
PACK | ORAL | Status: AC
  Filled 2018-05-10: qty 2

## 2018-05-10 MED ORDER — HYDROCORTISONE 1 % EX CREA
TOPICAL_CREAM | Freq: Two times a day (BID) | CUTANEOUS | Status: DC
  Administered 2018-05-10 – 2018-05-13 (×7): via TOPICAL
  Filled 2018-05-10: qty 28

## 2018-05-10 SURGICAL SUPPLY — 10 items
ADH SKN CLS APL DERMABOND .7 (GAUZE/BANDAGES/DRESSINGS) ×1
CATH PALINDROME RT-P 15FX19CM (CATHETERS) ×1 IMPLANT
DERMABOND ADVANCED (GAUZE/BANDAGES/DRESSINGS) ×1
DERMABOND ADVANCED .7 DNX12 (GAUZE/BANDAGES/DRESSINGS) ×1 IMPLANT
DRAPE INCISE IOBAN 66X45 STRL (DRAPES) ×1 IMPLANT
NEEDLE ENTRY 21GA 7CM ECHOTIP (NEEDLE) ×2 IMPLANT
PACK ANGIOGRAPHY (CUSTOM PROCEDURE TRAY) ×1 IMPLANT
SET INTRO CAPELLA COAXIAL (SET/KITS/TRAYS/PACK) ×1 IMPLANT
SUT MNCRL AB 4-0 PS2 18 (SUTURE) ×1 IMPLANT
SUT SILK 0 FSL (SUTURE) ×1 IMPLANT

## 2018-05-10 NOTE — ED Notes (Signed)
Pt resting quietly with eyes closed on stretcher in darkened exam room with no distress noted; resp even/unlab

## 2018-05-10 NOTE — Progress Notes (Addendum)
Pharmacy Electrolyte Monitoring Consult:  Pharmacy consulted to assist in monitoring and replacing electrolytes in this 62 y.o. female admitted on 05/09/2018 with Chest Pain and Abdominal Pain   Labs:  Sodium (mmol/L)  Date Value  05/10/2018 139  01/27/2018 142   Potassium (mmol/L)  Date Value  05/10/2018 3.1 (L)   Magnesium (mg/dL)  Date Value  05/10/2018 1.3 (L)   Phosphorus (mg/dL)  Date Value  01/27/2018 3.3   Calcium (mg/dL)  Date Value  05/10/2018 7.6 (L)   Albumin (g/dL)  Date Value  05/09/2018 3.5  01/27/2018 3.7    Assessment/Plan: K 3.1, Mag 1.3 - KCl 40 mEq PO x1 and mag sulfate 2 g IV x1 already given. Orders already for another mag sulfate 2 g IV x1  Add on Phos level Recheck labs in AM  Pharmacy will continue to follow.   Rocky Morel 05/10/2018 2:19 PM                        Phos 5.5 No supp needed  Rayna Sexton, PharmD, BCPS Clinical Pharmacist 05/10/2018 4:12 PM

## 2018-05-10 NOTE — H&P (Signed)
[facility] NAME: [name]    MR#:  [id number]  DATE OF BIRTH:  Jun 20, 1956  DATE OF ADMISSION:  05/09/2018  PRIMARY CARE PHYSICIAN: [name], PA-C   REQUESTING/REFERRING PHYSICIAN:   CHIEF COMPLAINT:   Chief Complaint  Patient presents with  . Chest Pain  . Abdominal Pain    HISTORY OF PRESENT ILLNESS: [name]  is a 62 y.o. female with a known history of CHF, CKD, anemia, CAD, hypertension, diabetes and other comorbidities. Patient presented to emergency room for acute onset of abdominal pain and diarrhea going on for the past 3 days.  Patient reports she has 10-15 episodes of watery diarrhea per day.  Nausea is associated but no vomiting.  No blood in stool, but per patient, the stool color is dark.  She denies any recent antibiotic use.  No history of C. difficile colitis. The abdominal pain is described as crampy diffuse in the lower abdomen, worse on the left lower quadrant, without radiation.  The abdominal pain improves after bowel movement. No sick contacts, no recent travel, no changes in medication list. No reports of fever, chills, bleeding. Test done emergency room are notable for elevated WBC at 11,000, potassium 3.3, BUN 142, creatinine level is 4.15. No acute findings per chest x-Heid and abdominal CAT scan. Is admitted for further evaluation and treatment.  PAST MEDICAL HISTORY:   Past Medical History:  Diagnosis Date  . Anemia   . Aortic stenosis    Echo 8/18: mean 13, peak 28, LVOT/AV mean velocity 0.51  . Arthritis   . Asthma    As a child   . Bronchitis   . CAD (coronary artery disease)    a. 09/2016: 50% Ost 1st Mrg stenosis, 50% 2nd Mrg stenosis, 20% Mid-Cx, 95% Prox LAD, 40% mid-LAD, and 10% dist-LAD stenosis. Staged PCI with DES to Prox-LAD.   [name] Chronic combined systolic and diastolic CHF (congestive heart failure) ([place]) 2011   echo 2/18: EF 55-60, normal wall motion, grade 2  diastolic dysfunction, trivial AI // echo 3/18: Septal and apical HK, EF 45-50, normal wall motion, trivial AI, mild LAE, PASP 38 // echo 8/18: EF 60-65, normal wall motion, grade 1 diastolic dysfunction, calcified aortic valve leaflets, mild aortic stenosis (mean 13, peak 28, LVOT/AV mean velocity 0.51), mild AI, moderate MAC, mild LAE, trivial TR   . Chronic kidney disease    STAGE 4  . Depression   . Diabetes mellitus Dx 1989  . GERD (gastroesophageal reflux disease)   . Gout   . Hepatitis C Dx 2013  . Hypertension Dx 1989  . Myocardial infarction ([place]) 07/2015  . Obesity   . Obstructive sleep apnea   . Pancreatitis 2013  . Pneumonia   . Refusal of blood transfusions as patient is Jehovah's Witness   . Tendinitis   . Tremors of nervous system    LEFT HAND  . Ulcer 2010    PAST SURGICAL HISTORY:  Past Surgical History:  Procedure Laterality Date  . APPLICATION OF WOUND VAC Left 08/14/2017   Procedure: APPLICATION OF WOUND VAC Exchange;  Surgeon: Robert Bellow, MD;  Location: [facility] ORS;  Service: General;  Laterality: Left;  . CHOLECYSTECTOMY    . COLONOSCOPY WITH PROPOFOL N/A 02/03/2018   Procedure: COLONOSCOPY WITH PROPOFOL;  Surgeon: Lin Landsman, MD;  Location: [facility] ENDOSCOPY;  Service: Gastroenterology;  Laterality: N/A;  . CORONARY ANGIOPLASTY  07/2015   STENT  .  CORONARY STENT INTERVENTION N/A 09/18/2016   Procedure: Coronary Stent Intervention;  Surgeon: Troy Sine, MD;  Location: [place] [facility];  Service: Cardiovascular;  Laterality: N/A;  . DRESSING CHANGE UNDER ANESTHESIA Left 08/15/2017   Procedure: exploration of wound for bleeding;  Surgeon: Robert Bellow, MD;  Location: [facility] ORS;  Service: General;  Laterality: Left;  . ESOPHAGOGASTRODUODENOSCOPY (EGD) WITH PROPOFOL N/A 02/03/2018   Procedure: ESOPHAGOGASTRODUODENOSCOPY (EGD) WITH PROPOFOL;  Surgeon: Lin Landsman, MD;  Location: [facility] ENDOSCOPY;  Service: Gastroenterology;  Laterality: N/A;   . EYE SURGERY    . INCISION AND DRAINAGE ABSCESS Left 08/12/2017   Procedure: INCISION AND DRAINAGE ABSCESS;  Surgeon: Robert Bellow, MD;  Location: [facility] ORS;  Service: General;  Laterality: Left;  . KNEE ARTHROSCOPY    . LEFT HEART CATH N/A 09/18/2016   Procedure: Left Heart Cath;  Surgeon: Troy Sine, MD;  Location: [place] [facility];  Service: Cardiovascular;  Laterality: N/A;  . LEFT HEART CATH AND CORONARY ANGIOGRAPHY N/A 09/16/2016   Procedure: Left Heart Cath and Coronary Angiography;  Surgeon: Burnell Blanks, MD;  Location: [place] [facility];  Service: Cardiovascular;  Laterality: N/A;  . LEFT HEART CATH AND CORONARY ANGIOGRAPHY N/A 04/29/2017   Procedure: LEFT HEART CATH AND CORONARY ANGIOGRAPHY;  Surgeon: Nelva Bush, MD;  Location: [place] [facility];  Service: Cardiovascular;  Laterality: N/A;  . LOWER EXTREMITY ANGIOGRAPHY Right 03/08/2018   Procedure: LOWER EXTREMITY ANGIOGRAPHY;  Surgeon: Katha Cabal, MD;  Location: [place] [facility];  Service: Cardiovascular;  Laterality: Right;  . TUBAL LIGATION    . TUBAL LIGATION      SOCIAL HISTORY:  Social History   Tobacco Use  . Smoking status: Former Smoker    Types: Cigarettes    Last attempt to quit: 10/25/1980    Years since quitting: 37.5  . Smokeless tobacco: Never Used  . Tobacco comment: quit smoking in 1982  Substance Use Topics  . Alcohol use: Yes    Comment: rarely    FAMILY HISTORY:  Family History  Problem Relation Age of Onset  . Colon cancer Mother   . Heart attack Other   . Heart attack Maternal Grandmother   . Hypertension Sister   . Hypertension Brother   . Diabetes Paternal Grandmother   . Breast cancer Neg Hx     DRUG ALLERGIES:  Allergies  Allergen Reactions  . Shellfish Allergy Anaphylaxis and Swelling  . Diazepam Other (See Comments)    "felt like out of body experience"  . Morphine And Related Itching    REVIEW OF SYSTEMS:   CONSTITUTIONAL: No fever,  fatigue or weakness.  EYES: No changes in vision.  EARS, NOSE, AND THROAT: No tinnitus or ear pain.  RESPIRATORY: No cough, shortness of breath, wheezing or hemoptysis.  CARDIOVASCULAR: No chest pain, orthopnea, edema.  GASTROINTESTINAL: Positive for nausea, no vomiting.  Positive for diarrhea and abdominal pain.  GENITOURINARY: No dysuria, hematuria.  ENDOCRINE: No polyuria, nocturia. HEMATOLOGY: No bleeding. SKIN: No rash or lesion. MUSCULOSKELETAL: No joint pain at this time.   NEUROLOGIC: No focal weakness.  PSYCHIATRY: No anxiety or depression.   MEDICATIONS AT HOME:  Prior to Admission medications   Medication Sig Start Date End Date Taking? Authorizing Provider  allopurinol (ZYLOPRIM) 100 MG tablet TAKE (2) TABLETS BY MOUTH ONCE DAILY. Patient taking differently: Take 200 mg by mouth daily.  01/06/18  Yes [name], PA-C  aspirin EC 81 MG EC tablet  Take 1 tablet (81 mg total) by mouth daily. 09/19/16  Yes Strader, [place] M, PA-C  atorvastatin (LIPITOR) 80 MG tablet Take 1 tablet (80 mg total) by mouth daily at 6 PM. Patient taking differently: Take 80 mg by mouth daily.  09/24/17  Yes Burnette, Anderson [place] M, PA-C  carvedilol (COREG) 25 MG tablet TAKE 1 TABLET BY MOUTH 2 TIMES DAILY WITH A MEAL. Patient taking differently: Take 25 mg by mouth 2 (two) times daily with a meal.  10/14/17  Yes Burnette, Clearnce Sorrel, PA-C  docusate sodium (COLACE) 100 MG capsule Take 1 capsule (100 mg total) by mouth 2 (two) times daily as needed for mild constipation. 09/20/17  Yes Gouru, Aruna, MD  FEROSUL 325 (65 Fe) MG tablet TAKE (1) TABLET BY MOUTH EVERY OTHER DAY. 01/06/18  Yes Burnette, Clearnce Sorrel, PA-C  fluticasone (FLONASE) 50 MCG/ACT nasal spray Place 2 sprays into both nostrils daily as needed for allergies or rhinitis. 09/28/17  Yes [name], PA-C  Fluticasone-Salmeterol (ADVAIR DISKUS) 250-50 MCG/DOSE AEPB Inhale 1 puff into the lungs 2 (two) times daily. 09/01/17  Yes [name] M, PA-C  glucose blood (ACCU-CHEK GUIDE) test strip To check blood sugar before meals and bedtime 04/14/18  Yes [name], PA-C  hydrOXYzine (ATARAX/VISTARIL) 50 MG tablet Take 1 tablet (50 mg total) by mouth 2 (two) times daily as needed. 04/11/18  Yes [name] M, PA-C  insulin aspart (NOVOLOG FLEXPEN) 100 UNIT/ML FlexPen Inject 25 Units into the skin 3 (three) times daily with meals. 03/30/18  Yes [name], PA-C  Insulin Pen Needle (PEN NEEDLES) 30G X 8 MM MISC 1 each by Does not apply route 3 (three) times daily before meals. AC for novolog injections 11/15/17  Yes [name], PA-C  isosorbide mononitrate (IMDUR) 30 MG 24 hr tablet TAKE (3) TABLETS BY MOUTH ONCE DAILY. 03/08/18  Yes [name], PA-C  Lancet Devices (ACCU-CHEK [place]) lancets Use as instructed daily. 04/14/17  Yes Newlin, Enobong, MD  LANTUS SOLOSTAR 100 UNIT/ML Solostar Pen INJECT 60 UNITS S.Q. TWICE DAILY. Patient taking differently: Inject 65 Units into the skin 2 (two) times daily.  03/01/18  Yes [name], PA-C  liraglutide (VICTOZA) 18 MG/3ML SOPN Start with 0.70m daily and increase by 0.666mper week until max dose of 1.8 mg dose achieved. 04/29/18  Yes BuFenton Malling, PA-C  loratadine (CLARITIN) 10 MG tablet TAKE 1 TABLET BY MOUTH ONCE DAILY. 04/07/18  Yes BuMar DaringPA-C  metolazone (ZAROXOLYN) 2.5 MG tablet TAKE (1) TABLET BY MOUTH DAILY AS NEEDED FOR SWELLING. 04/07/18  Yes Burnette, Jennifer M, PA-C  montelukast (SINGULAIR) 10 MG tablet TAKE ONE TABLET BY MOUTH AT BEDTIME. 04/07/18  Yes Burnette, JeClearnce SorrelPA-C  omeprazole (PRILOSEC) 20 MG capsule Take 1 capsule (20 mg total) by mouth daily. 09/24/17  Yes BuMar DaringPA-C  potassium chloride SA (K-DUR,KLOR-CON) 20 MEQ tablet TAKE 1 TABLET BY MOUTH TWICE DAILY 04/07/18  Yes BuMar DaringPA-C  pregabalin (LYRICA) 75 MG capsule Take 1 capsule (75 mg total) by mouth 2 (two)  times daily. 01/10/18  Yes BuMar DaringPA-C  PROAIR HFA 108 ([phone]) MCG/ACT inhaler INHALE 2 PUFFS BY MOUTH EVERY 4 HOURS AS NEEDED FOR SHORTNESS OF BREATH 04/07/18  Yes Burnette, Jennifer M, PA-C  SENNA PLUS 8.6-50 MG tablet Take 1 tablet by mouth daily as needed.  01/10/18  Yes [provider]  ticagrelor (BRILINTA) 90 MG TABS tablet  Take 1 tablet (90 mg total) by mouth 2 (two) times daily. 10/14/17  Yes [name], PA-C  torsemide (DEMADEX) 20 MG tablet TAKE (2) TABLETS BY MOUTH TWICE DAILY. 05/03/18  Yes [name], PA-C  triamcinolone cream (KENALOG) 0.1 % APPLY TO AFFECTED AREA TWICE DAILY 04/15/18  Yes Burnette, Jennifer M, PA-C  valACYclovir (VALTREX) 500 MG tablet TAKE (1) TABLET BY MOUTH EVERY OTHER DAY. Patient taking differently: Take 500 mg by mouth every other day.  01/06/18  Yes Burnette, Clearnce Sorrel, PA-C  Vitamin D, Ergocalciferol, (DRISDOL) 50000 units CAPS capsule TAKE 1 CAPSULE BY MOUTH ONCE A MONTH 03/08/18  Yes [name] M, PA-C  hydrALAZINE (APRESOLINE) 100 MG tablet Take 1 tablet (100 mg total) by mouth 3 (three) times daily. 01/10/18   [name], PA-C  nitroGLYCERIN (NITROSTAT) 0.4 MG SL tablet Place 1 tablet (0.4 mg total) under the tongue every 5 (five) minutes x 3 doses as needed for chest pain. 03/19/17   Barrett, Evelene Croon, PA-C  gabapentin (NEURONTIN) 100 MG capsule Take 1 capsule (100 mg total) by mouth 3 (three) times daily. 08/19/17 09/01/17  [name], MD      PHYSICAL EXAMINATION:   VITAL SIGNS: Blood pressure (!) 164/69, pulse 81, temperature 98 F (36.7 C), temperature source Oral, resp. rate 10, height _0  (1.727 m), weight 131.1 kg, SpO2 99 %.  GENERAL:  62 y.o.-year-old patient lying in the bed with mild to moderate distress, secondary to abdominal pain.  She looks acutely ill, exhausted. EYES: Pupils equal, round, reactive to light and accommodation. No scleral icterus. Extraocular muscles intact.  HEENT:  Head atraumatic, normocephalic. Oropharynx and nasopharynx clear.  NECK:  Supple, no jugular venous distention. No thyroid enlargement, no tenderness.  LUNGS: Normal breath sounds bilaterally, no wheezing, rales,rhonchi or crepitation. No use of accessory muscles of respiration.  CARDIOVASCULAR: S1, S2 normal. No S3/S4.  ABDOMEN: There is tenderness with palpation, diffusely at the lower abdomen.  Otherwise, abdomen is obese, soft, nondistended. Bowel sounds present.   EXTREMITIES: No pedal edema, cyanosis, or clubbing.  NEUROLOGIC: No focal weakness. PSYCHIATRIC: The patient is alert and oriented x 3.  SKIN: No obvious rash, lesion, or ulcer.   LABORATORY PANEL:   CBC Recent Labs  Lab 05/09/18 1715  WBC 11.0*  HGB 8.6*  HCT 27.4*  PLT 248  MCV 87.8  MCH 27.6  MCHC 31.4  RDW 16.4*   ------------------------------------------------------------------------------------------------------------------  Chemistries  Recent Labs  Lab 05/09/18 1715  Heather 136  K 3.3*  CL 98  CO2 25  GLUCOSE 179*  BUN 142*  CREATININE 4.15*  CALCIUM 8.4*  MG 1.2*  AST 22  ALT 19  ALKPHOS 147*  BILITOT 0.6   ------------------------------------------------------------------------------------------------------------------ estimated creatinine clearance is 20.7 mL/min (A) (by C-G formula based on SCr of 4.15 mg/dL (H)). ------------------------------------------------------------------------------------------------------------------ No results for input(s): TSH, T4TOTAL, T3FREE, THYROIDAB in the last 72 hours.  Invalid input(s): FREET3   Coagulation profile No results for input(s): INR, PROTIME in the last 168 hours. ------------------------------------------------------------------------------------------------------------------- No results for input(s): DDIMER in the last 72  hours. -------------------------------------------------------------------------------------------------------------------  Cardiac Enzymes Recent Labs  Lab 05/09/18 1715  TROPONINI <0.03   ------------------------------------------------------------------------------------------------------------------ Invalid input(s): POCBNP  ---------------------------------------------------------------------------------------------------------------  Urinalysis    Component Value Date/Time   COLORURINE YELLOW (A) 05/09/2018 2146   APPEARANCEUR CLEAR (A) 05/09/2018 2146   LABSPEC 1.009 05/09/2018 2146   PHURINE 5.0 05/09/2018 2146   GLUCOSEU NEGATIVE 05/09/2018 2146   [place] NEGATIVE 05/09/2018 2146  [place] NEGATIVE 05/09/2018 2146   BILIRUBINUR neg 12/17/2016 1538   KETONESUR NEGATIVE 05/09/2018 2146   PROTEINUR NEGATIVE 05/09/2018 2146   UROBILINOGEN 0.2 12/17/2016 1538   UROBILINOGEN 0.2 05/29/2010 2100   NITRITE NEGATIVE 05/09/2018 2146   LEUKOCYTESUR NEGATIVE 05/09/2018 2146     RADIOLOGY: Ct Abdomen Pelvis Wo Contrast  Result Date: 05/09/2018 CLINICAL DATA:  Left-sided sharp chest pain beginning yesterday with nausea. Diarrhea. EXAM: CT ABDOMEN AND PELVIS WITHOUT CONTRAST TECHNIQUE: Multidetector CT imaging of the abdomen and pelvis was performed following the standard protocol without IV contrast. COMPARISON:  03/08/2018 and 03/25/2012 FINDINGS: Lower chest: Minimal bibasilar scarring. Hepatobiliary: Previous cholecystectomy. Liver and biliary tree are otherwise within normal. Pancreas: 5 mm lipoma over the pancreatic head.  No acute changes. Spleen: Normal. Adrenals/Urinary Tract: Adrenal glands are normal. Kidneys are normal in size without hydronephrosis or nephrolithiasis. A few small left renal cysts unchanged. Ureters and bladder are normal. Stomach/Bowel: Stomach and small bowel are normal. Appendix is normal. Minimal diverticulosis of the colon. Vascular/Lymphatic:  Minimal calcified plaque over the abdominal aorta no adenopathy. Reproductive: Normal. Evidence of previous bilateral tubal ligation. Other: No free fluid or focal inflammatory change. Musculoskeletal: Minimal degenerative change of the spine and hips. IMPRESSION: No acute findings in the abdomen/pelvis. Few small left renal cysts unchanged. Mild colonic diverticulosis. Aortic Atherosclerosis (ICD10-I70.0). Electronically Signed   By: Marin Olp M.D.   On: 05/09/2018 22:17   Dg Chest 2 View  Result Date: 05/09/2018 CLINICAL DATA:  LEFT chest pain and nausea since yesterday. History of CHF. EXAM: CHEST - 2 VIEW COMPARISON:  CT chest March 08, 2018 FINDINGS: Cardiomediastinal silhouette is unremarkable for this low inspiratory examination with crowded vasculature markings. Mild bronchitic changes. The lungs are clear without pleural effusions or focal consolidations. Trachea projects midline and there is no pneumothorax. Included soft tissue planes and osseous structures are non-suspicious. IMPRESSION: Mild bronchitic changes. Electronically Signed   By: Elon Alas M.D.   On: 05/09/2018 18:05    EKG: Orders placed or performed during the hospital encounter of 05/09/18  . EKG 12-Lead  . EKG 12-Lead  . ED EKG within 10 minutes  . ED EKG within 10 minutes    IMPRESSION AND PLAN:  1.  Acute enteritis, will rule out infectious causes.  We will start treatment with IV fluids and antinausea medications. 2.  ARF/CKD4, likely prerenal, secondary to dehydration from acute diarrhea.  We will start treatment with IV fluids and monitor kidney function closely.  Avoid nephrotoxic medications.  Nephrology is consulted for further evaluation and treatment. 3.  CHF, currently clinically compensated, continue medical treatment. 4.  Diabetes type 2.  Will monitor blood sugars before meals and at bedtime and use insulin treatment during the hospital stay. 5.  Hypertension, will resume home  medications. 6.  Hypokalemia, secondary to diarrhea.  Will replace potassium per protocol.   All the records are reviewed and case discussed with ED provider. Management plans discussed with the patient, family and they are in agreement.  CODE STATUS: Full Code Status History    Date Active Date Inactive Code Status Order ID Comments User Context   03/08/2018 0925 03/10/2018 0312 Full Code [id number]  Katha Cabal, MD Inpatient   12/25/2017 0059 12/28/2017 1721 Full Code [id number]  Toy Baker, MD ED   10/03/2017 1803 10/11/2017 1625 Full Code [id number]  Epifanio Lesches, MD ED   09/14/2017 1310 09/20/2017 1434 Full Code [id number]  Max Sane, MD ED   08/15/2017 2306  08/19/2017 2039 Full Code [id number]  Robert Bellow, MD Inpatient   08/11/2017 1350 08/15/2017 2306 Full Code [id number]  Demetrios Loll, MD Inpatient   06/02/2017 0234 06/06/2017 1536 Full Code [id number]  Caren Griffins, MD Inpatient   04/28/2017 0516 05/02/2017 1624 Full Code [id number]  Rise Patience, MD ED   03/17/2017 1222 03/19/2017 2030 Full Code [id number]  Liliane Shi, PA-C ED   12/13/2016 0345 12/14/2016 1647 Full Code [id number]  Reubin Milan, MD Inpatient   09/13/2016 1149 09/19/2016 1458 Full Code [id number]  Flossie Dibble, MD ED   09/04/2016 0137 09/05/2016 1801 Full Code [id number]  Etta Quill, DO ED   08/21/2016 1846 08/22/2016 1733 Full Code [id number]  Thurnell Lose, MD Inpatient   03/18/2015 0939 03/19/2015 1802 Full Code [id number]  Dellia Nims, MD Inpatient   02/15/2015 1700 02/18/2015 1817 Full Code [id number]  Francesca Oman, DO Inpatient   10/02/2014 1524 10/04/2014 1649 Full Code [id number]  Samella Parr, NP Inpatient   03/25/2012 0531 04/03/2012 1931 Full Code [id number]  Maudry Diego, RN Inpatient       TOTAL TIME TAKING CARE OF THIS PATIENT: 50 minutes.    Amelia Jo M.D on 05/10/2018 at 12:57 AM  Between 7am to 6pm - Pager - [phone]  After 6pm go to [email or web address] - password  [facility] [place] [facility]  [phone]  CC: Primary care physician; [name], PA-C

## 2018-05-10 NOTE — Progress Notes (Signed)
[facility]  ROUNDING NOTE   Subjective:   [name] admitted to [facility] on 05/09/2018 for chest pain, nausea   Found to have elevated creatinine and BUN.  Daughter at bedside.   Patient complains of orthopnea, dyspnea on exertion, dysguesia and poor appetite.   Patient is agreeable for dialysis.   Objective:  Vital signs in last 24 hours:  Temp:  [97.8 F (36.6 C)-98 F (36.7 C)] 98 F (36.7 C) (10/28 1709) Pulse Rate:  [72-89] 87 (10/29 0800) Resp:  [9-20] 12 (10/29 0800) BP: (112-167)/(58-100) 147/71 (10/29 0800) SpO2:  [94 %-100 %] 98 % (10/29 0800) Weight:  [131.1 kg-131.5 kg] 131.1 kg (10/28 1711)  Weight change:  Filed Weights   05/09/18 1711  Weight: 131.1 kg    Intake/Output: I/O last 3 completed shifts: In: 240 [P.O.:240] Out: -    Intake/Output this shift:  No intake/output data recorded.  Physical Exam: General: NAD,   Head: Normocephalic, atraumatic. Moist oral mucosal membranes  Eyes: Anicteric, PERRL  Neck: Supple, trachea midline  Lungs:  Bilateral crackles  Heart: Regular rate and rhythm  Abdomen:  +distended  Extremities:  trace peripheral edema.  Neurologic: Nonfocal, moving all four extremities  Skin: No lesions  Access: none    Basic Metabolic Panel: Recent Labs  Lab 05/09/18 1715 05/10/18 0558 05/10/18 0834  NA 136 139  --   K 3.3* 3.1*  --   CL 98 102  --   CO2 25 25  --   GLUCOSE 179* 195*  --   BUN 142* 147*  --   CREATININE 4.15* 3.99*  --   CALCIUM 8.4* 7.6*  --   MG 1.2*  --  1.3*    Liver Function Tests: Recent Labs  Lab 05/09/18 1715  AST 22  ALT 19  ALKPHOS 147*  BILITOT 0.6  PROT 7.8  ALBUMIN 3.5   Recent Labs  Lab 05/09/18 1715  LIPASE 52*   No results for input(s): AMMONIA in the last 168 hours.  CBC: Recent Labs  Lab 05/09/18 1715 05/10/18 0516  WBC 11.0* 9.7  HGB 8.6* 8.1*  HCT 27.4* 26.3*  MCV 87.8 88.3  PLT 248 232    Cardiac Enzymes: Recent Labs  Lab  05/09/18 1715  TROPONINI <0.03    BNP: Invalid input(s): POCBNP  CBG: Recent Labs  Lab 05/10/18 0729 05/10/18 1223  GLUCAP 189* 213*    Microbiology: Results for orders placed or performed during the hospital encounter of 09/14/17  MRSA PCR Screening     Status: None   Collection Time: 09/14/17  4:40 PM  Result Value Ref Range Status   MRSA by PCR NEGATIVE NEGATIVE Final    Comment:        The GeneXpert MRSA Assay (FDA approved for NASAL specimens only), is one component of a comprehensive MRSA colonization surveillance program. It is not intended to diagnose MRSA infection nor to guide or monitor treatment for MRSA infections. Performed at [facility], [address] [place], [place] [address]   CULTURE, BLOOD (ROUTINE X 2) w Reflex to ID Panel     Status: None   Collection Time: 09/14/17 11:11 PM  Result Value Ref Range Status   Specimen Description BLOOD LT [facility]  Final   Special Requests   Final    BOTTLES DRAWN AEROBIC AND ANAEROBIC Blood Culture adequate volume   Culture   Final    NO GROWTH 5 DAYS Performed at [facility], [address] [place], [place] [address]    Report Status 09/19/2017 FINAL  Final  CULTURE, BLOOD (ROUTINE X 2) w Reflex to ID Panel     Status: None   Collection Time: 09/14/17 11:37 PM  Result Value Ref Range Status   Specimen Description BLOOD RT ARM  Final   Special Requests   Final    BOTTLES DRAWN AEROBIC AND ANAEROBIC Blood Culture results may not be optimal due to an inadequate volume of blood received in culture bottles   Culture   Final    NO GROWTH 5 DAYS Performed at [facility], [address] [place], [place] [address]    Report Status 09/19/2017 FINAL  Final  Urine Culture     Status: None   Collection Time: 09/15/17  3:18 AM  Result Value Ref Range Status   Specimen Description   Final    URINE, RANDOM Performed at [facility], [address] [place], [place]  [address]    Special Requests   Final    NONE Performed at [facility], [address], [place], [place] [address]    Culture   Final    NO GROWTH Performed at [facility], [place] [address], [place],  [address]    Report Status 09/16/2017 FINAL  Final    Coagulation Studies: No results for input(s): LABPROT, INR in the last 72 hours.  Urinalysis: Recent Labs    05/09/18 2146  COLORURINE YELLOW*  LABSPEC 1.009  PHURINE 5.0  GLUCOSEU NEGATIVE  HGBUR NEGATIVE  BILIRUBINUR NEGATIVE  KETONESUR NEGATIVE  PROTEINUR NEGATIVE  NITRITE NEGATIVE  LEUKOCYTESUR NEGATIVE      Imaging: Ct Abdomen Pelvis Wo Contrast  Result Date: 05/09/2018 CLINICAL DATA:  Left-sided sharp chest pain beginning yesterday with nausea. Diarrhea. EXAM: CT ABDOMEN AND PELVIS WITHOUT CONTRAST TECHNIQUE: Multidetector CT imaging of the abdomen and pelvis was performed following the standard protocol without IV contrast. COMPARISON:  03/08/2018 and 03/25/2012 FINDINGS: Lower chest: Minimal bibasilar scarring. Hepatobiliary: Previous cholecystectomy. Liver and biliary tree are otherwise within normal. Pancreas: 5 mm lipoma over the pancreatic head.  No acute changes. Spleen: Normal. Adrenals/Urinary Tract: Adrenal glands are normal. Kidneys are normal in size without hydronephrosis or nephrolithiasis. A few small left renal cysts unchanged. Ureters and bladder are normal. Stomach/Bowel: Stomach and small bowel are normal. Appendix is normal. Minimal diverticulosis of the colon. Vascular/Lymphatic: Minimal calcified plaque over the abdominal aorta no adenopathy. Reproductive: Normal. Evidence of previous bilateral tubal ligation. Other: No free fluid or focal inflammatory change. Musculoskeletal: Minimal degenerative change of the spine and hips. IMPRESSION: No acute findings in the abdomen/pelvis. Few small left renal cysts unchanged. Mild colonic diverticulosis. Aortic Atherosclerosis (ICD10-I70.0).  Electronically Signed   By: Marin Olp M.D.   On: 05/09/2018 22:17   Dg Chest 2 View  Result Date: 05/09/2018 CLINICAL DATA:  LEFT chest pain and nausea since yesterday. History of CHF. EXAM: CHEST - 2 VIEW COMPARISON:  CT chest March 08, 2018 FINDINGS: Cardiomediastinal silhouette is unremarkable for this low inspiratory examination with crowded vasculature markings. Mild bronchitic changes. The lungs are clear without pleural effusions or focal consolidations. Trachea projects midline and there is no pneumothorax. Included soft tissue planes and osseous structures are non-suspicious. IMPRESSION: Mild bronchitic changes. Electronically Signed   By: Elon Alas M.D.   On: 05/09/2018 18:05     Medications:   . 0.9 % NaCl with KCl 40 mEq / L 75 mL/hr (05/10/18 1245)  .  magnesium sulfate 1 - 4 g bolus IVPB     . allopurinol  200 mg Oral Daily  . aspirin EC  81 mg Oral Daily  . atorvastatin  80 mg Oral Daily  . carvedilol  25 mg Oral BID WC  . docusate sodium  100 mg Oral BID  . ferrous sulfate  325 mg Oral Q breakfast  . heparin  5,000 Units Subcutaneous Q8H  . hydrALAZINE  100 mg Oral TID  . insulin aspart  0-15 Units Subcutaneous TID WC  . insulin aspart  0-5 Units Subcutaneous QHS  . insulin glargine  45 Units Subcutaneous BID  . isosorbide mononitrate  90 mg Oral Daily  . lactobacillus  1 g Oral TID WC  . loratadine  10 mg Oral Daily  . magnesium chloride  1 tablet Oral BID  . mometasone-formoterol  2 puff Inhalation BID  . montelukast  10 mg Oral QHS  . pantoprazole  40 mg Oral Daily  . potassium chloride      . pregabalin  75 mg Oral BID  . ticagrelor  90 mg Oral BID  . valACYclovir  500 mg Oral QODAY   acetaminophen **OR** acetaminophen, albuterol, bisacodyl, fluticasone, hydrALAZINE, HYDROcodone-acetaminophen, hydrOXYzine, nitroGLYCERIN, ondansetron **OR** ondansetron (ZOFRAN) IV, traZODone  Assessment/ Plan:  [name] is a 62 y.o. black female with  hypertension, coronary artery disease, congestive heart failure with diastolic dysfunction, diabetes mellitus type II, chronic hepatitis C, peptic ulcer disease, obstructive sleep apnea who presents for follow up  1. Acute renal failure on chronic kidney disease stage IV: with proteinuria. Versus progression to End Stage Renal Disease Chronic kidney disease secondary to hypertension and diabetes. Acute renal failure versus progression of chronic kidney disease Creatinine was 3.42, GFR of 16 on 9/30 - Discussed dialysis. Plan on tunneled catheter for later today.  - Initial dialysis for either today or tomorrow.  - Check hepatitis screen and Quantiferon gold  2. Hypertension: 147/71 - hold diuretics  3. Diabetes mellitus type II with chronic kidney disease: Insulin dependent. Not well controlled. Hemoglobin A1c 11.1% on 04/11/18  4. Gout: no recent acute flares - Continue allopurinol   5. Anemia with chronic kidney disease: hemoglobin 8.1 normocytic - EPO with HD treatments.    6. Secondary Hyperparathyroidism: PTH 341 on 04/11/18. Hypocalcemia today.  - Check phosphorus - discussed binders.    LOS: 1 Julie-Ann Vanmaanen 10/29/201912:49 PM

## 2018-05-10 NOTE — ED Notes (Signed)
Pt sleeping soundly; awakens briefly for sched lab draw; denies c/o

## 2018-05-10 NOTE — Progress Notes (Signed)
[facility] [place] NAME: [name]    MR#:  [id number]  DATE OF BIRTH:  [birth year]  SUBJECTIVE:  CHIEF COMPLAINT:   Chief Complaint  Patient presents with  . Chest Pain  . Abdominal Pain  Patient without complaint diarrhea has resolved, no vomiting/emesis, tolerating a chicken biscuit sandwich per patient, GI panel unable to be obtained given lack of diarrhea  REVIEW OF SYSTEMS:  CONSTITUTIONAL: No fever, fatigue or weakness.  EYES: No blurred or double vision.  EARS, NOSE, AND THROAT: No tinnitus or ear pain.  RESPIRATORY: No cough, shortness of breath, wheezing or hemoptysis.  CARDIOVASCULAR: No chest pain, orthopnea, edema.  GASTROINTESTINAL: No nausea, vomiting, diarrhea or abdominal pain.  GENITOURINARY: No dysuria, hematuria.  ENDOCRINE: No polyuria, nocturia,  HEMATOLOGY: No anemia, easy bruising or bleeding SKIN: No rash or lesion. MUSCULOSKELETAL: No joint pain or arthritis.   NEUROLOGIC: No tingling, numbness, weakness.  PSYCHIATRY: No anxiety or depression.   ROS  DRUG ALLERGIES:   Allergies  Allergen Reactions  . Shellfish Allergy Anaphylaxis and Swelling  . Diazepam Other (See Comments)    "felt like out of body experience"  . Morphine And Related Itching    VITALS:  Blood pressure (!) 147/71, pulse 87, temperature 98 F (36.7 C), temperature source Oral, resp. rate 12, height 5\' 8"  (1.727 m), weight 131.1 kg, SpO2 98 %.  PHYSICAL EXAMINATION:  GENERAL:  62 y.o.-year-old patient lying in the bed with no acute distress.  EYES: Pupils equal, round, reactive to light and accommodation. No scleral icterus. Extraocular muscles intact.  HEENT: Head atraumatic, normocephalic. Oropharynx and nasopharynx clear.  NECK:  Supple, no jugular venous distention. No thyroid enlargement, no tenderness.  LUNGS: Normal breath sounds bilaterally, no wheezing, rales,rhonchi or crepitation. No use of accessory muscles of  respiration.  CARDIOVASCULAR: S1, S2 normal. No murmurs, rubs, or gallops.  ABDOMEN: Soft, nontender, nondistended. Bowel sounds present. No organomegaly or mass.  EXTREMITIES: No pedal edema, cyanosis, or clubbing.  NEUROLOGIC: Cranial nerves II through XII are intact. Muscle strength 5/5 in all extremities. Sensation intact. Gait not checked.  PSYCHIATRIC: The patient is alert and oriented x 3.  SKIN: No obvious rash, lesion, or ulcer.   Physical Exam LABORATORY PANEL:   CBC Recent Labs  Lab 05/10/18 0516  WBC 9.7  HGB 8.1*  HCT 26.3*  PLT 232   ------------------------------------------------------------------------------------------------------------------  Chemistries  Recent Labs  Lab 05/09/18 1715 05/10/18 0558 05/10/18 0834  NA 136 139  --   K 3.3* 3.1*  --   CL 98 102  --   CO2 25 25  --   GLUCOSE 179* 195*  --   BUN 142* 147*  --   CREATININE 4.15* 3.99*  --   CALCIUM 8.4* 7.6*  --   MG 1.2*  --  1.3*  AST 22  --   --   ALT 19  --   --   ALKPHOS 147*  --   --   BILITOT 0.6  --   --    ------------------------------------------------------------------------------------------------------------------  Cardiac Enzymes Recent Labs  Lab 05/09/18 1715  TROPONINI <0.03   ------------------------------------------------------------------------------------------------------------------  RADIOLOGY:  Ct Abdomen Pelvis Wo Contrast  Result Date: 05/09/2018 CLINICAL DATA:  Left-sided sharp chest pain beginning yesterday with nausea. Diarrhea. EXAM: CT ABDOMEN AND PELVIS WITHOUT CONTRAST TECHNIQUE: Multidetector CT imaging of the abdomen and pelvis was performed following the standard protocol without IV contrast. COMPARISON:  03/08/2018 and 03/25/2012 FINDINGS: Lower  chest: Minimal bibasilar scarring. Hepatobiliary: Previous cholecystectomy. Liver and biliary tree are otherwise within normal. Pancreas: 5 mm lipoma over the pancreatic head.  No acute changes. Spleen:  Normal. Adrenals/Urinary Tract: Adrenal glands are normal. Kidneys are normal in size without hydronephrosis or nephrolithiasis. A few small left renal cysts unchanged. Ureters and bladder are normal. Stomach/Bowel: Stomach and small bowel are normal. Appendix is normal. Minimal diverticulosis of the colon. Vascular/Lymphatic: Minimal calcified plaque over the abdominal aorta no adenopathy. Reproductive: Normal. Evidence of previous bilateral tubal ligation. Other: No free fluid or focal inflammatory change. Musculoskeletal: Minimal degenerative change of the spine and hips. IMPRESSION: No acute findings in the abdomen/pelvis. Few small left renal cysts unchanged. Mild colonic diverticulosis. Aortic Atherosclerosis (ICD10-I70.0). Electronically Signed   By: Marin Olp M.D.   On: 05/09/2018 22:17   Dg Chest 2 View  Result Date: 05/09/2018 CLINICAL DATA:  LEFT chest pain and nausea since yesterday. History of CHF. EXAM: CHEST - 2 VIEW COMPARISON:  CT chest March 08, 2018 FINDINGS: Cardiomediastinal silhouette is unremarkable for this low inspiratory examination with crowded vasculature markings. Mild bronchitic changes. The lungs are clear without pleural effusions or focal consolidations. Trachea projects midline and there is no pneumothorax. Included soft tissue planes and osseous structures are non-suspicious. IMPRESSION: Mild bronchitic changes. Electronically Signed   By: Elon Alas M.D.   On: 05/09/2018 18:05    ASSESSMENT AND PLAN:  *Acute probable viral gastroenteritis Resolving Antiemetics PRN, check GI panel if able to obtain stool, Lactinex 3 times daily  *AKI w/CKD IV Improving Most likely exacerbated by above Gentle IV fluids for rehydration, nephrology to see, avoid nephrotoxic agents, strict I&O monitoring, daily weights, BMP in the morning  *History of CHF-without exacerbation Stable On Brilinta, continue Coreg  *Chronic diabetes mellitus type 2 Sliding scale insulin  with Accu-Cheks per routine  *Hypertension Continue home regiment, PRN hydralazine, vitals per routine, make changes as per necessary  *Acute hypokalemia/hypomagnesemia Likely secondary to diarrhea Replete as needed  All the records are reviewed and case discussed with Care Management/Social Workerr. Management plans discussed with the patient, family and they are in agreement.  CODE STATUS: full  TOTAL TIME TAKING CARE OF THIS PATIENT: 40 minutes.   POSSIBLE D/C IN 1-2 DAYS, DEPENDING ON CLINICAL CONDITION.   Avel Peace Salary M.D on 05/10/2018   Between 7am to 6pm - Pager - [phone]  After 6pm go to [email or web address] - password [email or web address] [facility]  Office  [phone]  CC: Primary care physician; [name], PA-C  Note: This dictation was prepared with Dragon dictation along with smaller phrase technology. Any transcriptional errors that result from this process are unintentional.

## 2018-05-10 NOTE — Consult Note (Signed)
[facility] Admission History & Physical  MRN : [id number]  [name] is a 62 y.o. ([birth year]) female who presents with chief complaint of  Chief Complaint  Patient presents with  . Chest Pain  . Abdominal Pain  .  History of Present Illness:  I am asked to evaluate the patient by Dr Juleen China.   The patient is a 62 year old woman admitted to [facility] yesterday with increasing abdominal pain and diarrhea.  Symptoms began 3 days ago.  They have been steady throughout the 3 days.  She reports the diarrhea is watery having multiple episodes no visible blood.  She does have some nausea but no vomiting.  She was noted to have a BUN of 142 and a creatinine of 4.15 on her initial labs in the emergency room.  This has not improved with hydration she will require hemodialysis.   Given the urgent nature tunneled catheter is being placed.  Current Facility-Administered Medications  Medication Dose Route Frequency Provider Last Rate Last Dose  . 0.9 % NaCl with KCl 40 mEq / L  infusion   Intravenous Continuous Salary, Montell D, MD 75 mL/hr at 05/10/18 1630    . [MAR Hold] acetaminophen (TYLENOL) tablet 650 mg  650 mg Oral Q6H PRN Amelia Jo, MD       Or  . Doug Sou Hold] acetaminophen (TYLENOL) suppository 650 mg  650 mg Rectal Q6H PRN Amelia Jo, MD      . Doug Sou Hold] albuterol (PROVENTIL) (2.5 MG/3ML) 0.083% nebulizer solution 3 mL  3 mL Inhalation Q4H PRN Amelia Jo, MD      . Doug Sou Hold] allopurinol (ZYLOPRIM) tablet 100 mg  100 mg Oral Daily Salary, Montell D, MD      . Doug Sou Hold] aspirin EC tablet 81 mg  81 mg Oral Daily Amelia Jo, MD   81 mg at 05/10/18 0948  . [MAR Hold] atorvastatin (LIPITOR) tablet 80 mg  80 mg Oral Daily Amelia Jo, MD   80 mg at 05/10/18 0947  . [MAR Hold] bisacodyl (DULCOLAX) EC tablet 5 mg  5 mg Oral Daily PRN Amelia Jo, MD      . Doug Sou Hold] carvedilol (COREG) tablet 25 mg  25 mg Oral BID WC Amelia Jo, MD   25 mg at 05/10/18 1609  . [MAR Hold] ceFAZolin (ANCEF) IVPB 2g/100 mL premix  2 g Intravenous On Call to [place], Dolores Lory, MD      . diphenhydrAMINE (BENADRYL) injection 50 mg  50 mg Intravenous Once Nayla Dias, Dolores Lory, MD      . Doug Sou Hold] ferrous sulfate tablet 325 mg  325 mg Oral Q breakfast Amelia Jo, MD   325 mg at 05/10/18 0756  . [MAR Hold] fluticasone (FLONASE) 50 MCG/ACT nasal spray 2 spray  2 spray Each Nare Daily PRN Amelia Jo, MD      . Doug Sou Hold] heparin injection 5,000 Units  5,000 Units Subcutaneous Q8H Amelia Jo, MD   5,000 Units at 05/10/18 0520  . [MAR Hold] hydrALAZINE (APRESOLINE) injection 10 mg  10 mg Intravenous Q4H PRN Salary, Montell D, MD      . Doug Sou Hold] hydrALAZINE (APRESOLINE) tablet 100 mg  100 mg Oral TID Amelia Jo, MD   100 mg at 05/10/18 1609  . [MAR Hold] HYDROcodone-acetaminophen (NORCO/VICODIN) 5-325 MG per tablet 1-2 tablet  1-2 tablet Oral Q8H PRN Salary, Avel Peace, MD   2 tablet at 05/10/18 1608  . [MAR Hold] hydrOXYzine (  ATARAX/VISTARIL) tablet 50 mg  50 mg Oral Q4H PRN Amelia Jo, MD      . Doug Sou Hold] insulin aspart (novoLOG) injection 0-15 Units  0-15 Units Subcutaneous TID WC Amelia Jo, MD   5 Units at 05/10/18 1245  . [MAR Hold] insulin aspart (novoLOG) injection 0-5 Units  0-5 Units Subcutaneous QHS Amelia Jo, MD      . Doug Sou Hold] insulin glargine (LANTUS) injection 45 Units  45 Units Subcutaneous BID Amelia Jo, MD   45 Units at 05/10/18 1030  . [MAR Hold] isosorbide mononitrate (IMDUR) 24 hr tablet 90 mg  90 mg Oral Daily Amelia Jo, MD   90 mg at 05/10/18 0948  . [MAR Hold] lactobacillus (FLORANEX/LACTINEX) granules 1 g  1 g Oral TID WC Salary, Montell D, MD      . Doug Sou Hold] loratadine (CLARITIN) tablet 10 mg  10 mg Oral Daily Amelia Jo, MD   10 mg at 05/10/18 0948  . [MAR Hold] magnesium chloride (SLOW-MAG) 64 MG SR tablet 64 mg  1 tablet Oral BID Salary, Montell D, MD   64 mg at 05/10/18 1609   . methylPREDNISolone sodium succinate (SOLU-MEDROL) 125 mg/2 mL injection 125 mg  125 mg Intravenous Once Laretta Pyatt, Dolores Lory, MD      . Doug Sou Hold] mometasone-formoterol [facility]) 200-5 MCG/ACT inhaler 2 puff  2 puff Inhalation BID Amelia Jo, MD   2 puff at 05/10/18 0756  . [MAR Hold] montelukast (SINGULAIR) tablet 10 mg  10 mg Oral QHS Amelia Jo, MD      . Doug Sou Hold] nitroGLYCERIN (NITROSTAT) SL tablet 0.4 mg  0.4 mg Sublingual Q5 Min x 3 PRN Amelia Jo, MD      . Doug Sou Hold] ondansetron [facility]) tablet 4 mg  4 mg Oral Q6H PRN Amelia Jo, MD       Or  . Doug Sou Hold] ondansetron [facility]) injection 4 mg  4 mg Intravenous Q6H PRN Amelia Jo, MD      . Doug Sou Hold] pantoprazole (PROTONIX) EC tablet 40 mg  40 mg Oral Daily Amelia Jo, MD   40 mg at 05/10/18 0947  . potassium chloride (KLOR-CON) 20 MEQ packet           . [MAR Hold] pregabalin (LYRICA) capsule 75 mg  75 mg Oral BID Amelia Jo, MD   75 mg at 05/10/18 0948  . [MAR Hold] ticagrelor (BRILINTA) tablet 90 mg  90 mg Oral BID Amelia Jo, MD   90 mg at 05/10/18 1030  . [MAR Hold] traZODone (DESYREL) tablet 25 mg  25 mg Oral QHS PRN Amelia Jo, MD      . Doug Sou Hold] valACYclovir (VALTREX) tablet 500 mg  500 mg Oral Darl Householder, MD   500 mg at 05/10/18 4854     Past Surgical History:  Procedure Laterality Date  . APPLICATION OF WOUND VAC Left 08/14/2017   Procedure: APPLICATION OF WOUND VAC Exchange;  Surgeon: Robert Bellow, MD;  Location: [facility] ORS;  Service: General;  Laterality: Left;  . CHOLECYSTECTOMY    . COLONOSCOPY WITH PROPOFOL N/A 02/03/2018   Procedure: COLONOSCOPY WITH PROPOFOL;  Surgeon: Lin Landsman, MD;  Location: [facility] ENDOSCOPY;  Service: Gastroenterology;  Laterality: N/A;  . CORONARY ANGIOPLASTY  07/2015   STENT  . CORONARY STENT INTERVENTION N/A 09/18/2016   Procedure: Coronary Stent Intervention;  Surgeon: Troy Sine, MD;  Location: [place] [facility];  Service: Cardiovascular;   Laterality: N/A;  . DRESSING CHANGE UNDER ANESTHESIA Left  08/15/2017   Procedure: exploration of wound for bleeding;  Surgeon: Robert Bellow, MD;  Location: [facility] ORS;  Service: General;  Laterality: Left;  . ESOPHAGOGASTRODUODENOSCOPY (EGD) WITH PROPOFOL N/A 02/03/2018   Procedure: ESOPHAGOGASTRODUODENOSCOPY (EGD) WITH PROPOFOL;  Surgeon: Lin Landsman, MD;  Location[facility] ENDOSCOPY;  Service: Gastroenterology;  Laterality: N/A;  . EYE SURGERY    . INCISION AND DRAINAGE ABSCESS Left 08/12/2017   Procedure: INCISION AND DRAINAGE ABSCESS;  Surgeon: Robert Bellow, MD;  Location: [facility] ORS;  Service: General;  Laterality: Left;  . KNEE ARTHROSCOPY    . LEFT HEART CATH N/A 09/18/2016   Procedure: Left Heart Cath;  Surgeon: Troy Sine, MD;  Location: [place] [facility];  Service: Cardiovascular;  Laterality: N/A;  . LEFT HEART CATH AND CORONARY ANGIOGRAPHY N/A 09/16/2016   Procedure: Left Heart Cath and Coronary Angiography;  Surgeon: Burnell Blanks, MD;  Location: [place] [facility];  Service: Cardiovascular;  Laterality: N/A;  . LEFT HEART CATH AND CORONARY ANGIOGRAPHY N/A 04/29/2017   Procedure: LEFT HEART CATH AND CORONARY ANGIOGRAPHY;  Surgeon: Nelva Bush, MD;  Location: [place] [facility];  Service: Cardiovascular;  Laterality: N/A;  . LOWER EXTREMITY ANGIOGRAPHY Right 03/08/2018   Procedure: LOWER EXTREMITY ANGIOGRAPHY;  Surgeon: Katha Cabal, MD;  Location: [place] [facility];  Service: Cardiovascular;  Laterality: Right;  . TUBAL LIGATION    . TUBAL LIGATION      Social History Social History   Tobacco Use  . Smoking status: Former Smoker    Types: Cigarettes    Last attempt to quit: 10/25/1980    Years since quitting: 37.5  . Smokeless tobacco: Never Used  . Tobacco comment: quit smoking in 1982  Substance Use Topics  . Alcohol use: Yes    Comment: rarely  . Drug use: Not Currently    Types: "Crack" cocaine    Comment: 08/21/2016 "clean since  05/1998"    Family History Family History  Problem Relation Age of Onset  . Colon cancer Mother   . Heart attack Other   . Heart attack Maternal Grandmother   . Hypertension Sister   . Hypertension Brother   . Diabetes Paternal Grandmother   . Breast cancer Neg Hx     No family history of bleeding or clotting disorders, autoimmune disease or porphyria  Allergies  Allergen Reactions  . Shellfish Allergy Anaphylaxis and Swelling  . Diazepam Other (See Comments)    "felt like out of body experience"  . Morphine And Related Itching     REVIEW OF SYSTEMS (Negative unless checked)  Constitutional: [] Weight loss  [] Fever  [] Chills Cardiac: [] Chest pain   [] Chest pressure   [] Palpitations   [] Shortness of breath when laying flat   [] Shortness of breath at rest   [x] Shortness of breath with exertion. Vascular:  [] Pain in legs with walking   [] Pain in legs at rest   [] Pain in legs when laying flat   [] Claudication   [] Pain in feet when walking  [] Pain in feet at rest  [] Pain in feet when laying flat   [] History of DVT   [] Phlebitis   [] Swelling in legs   [] Varicose veins   [] Non-healing ulcers Pulmonary:   [] Uses home oxygen   [] Productive cough   [] Hemoptysis   [] Wheeze  [] COPD   [] Asthma Neurologic:  [] Dizziness  [] Blackouts   [] Seizures   [] History of stroke   [] History of TIA  [] Aphasia   [] Temporary blindness   [] Dysphagia   []   Weakness or numbness in arms   [] Weakness or numbness in legs Musculoskeletal:  [] Arthritis   [] Joint swelling   [] Joint pain   [] Low back pain Hematologic:  [] Easy bruising  [] Easy bleeding   [] Hypercoagulable state   [] Anemic  [] Hepatitis Gastrointestinal:  [] Blood in stool   [] Vomiting blood  [x] Gastroesophageal reflux/heartburn   [] Difficulty swallowing. Genitourinary:  [x] Chronic kidney disease   [] Difficult urination  [] Frequent urination  [] Burning with urination   [] Blood in urine Skin:  [] Rashes   [] Ulcers   [] Wounds Psychological:  [] History of anxiety    []  History of major depression.  Physical Examination  Vitals:   05/10/18 1750 05/10/18 1755 05/10/18 1800 05/10/18 1805  BP:      Pulse:      Resp:      Temp:      TempSrc:      SpO2: 100% 100% 100% 100%  Weight:      Height:       Body mass index is 43.91 kg/m. Gen: WD/WN, NAD Head: [place]/AT, No temporalis wasting. Prominent temp pulse not noted. Ear/Nose/Throat: Hearing grossly intact, nares w/o erythema or drainage, oropharynx w/o Erythema/Exudate,  Eyes: Conjunctiva clear, sclera non-icteric Neck: Trachea midline.  No JVD.  Pulmonary:  Good air movement, respirations not labored, no use of accessory muscles.  Cardiac: RRR, normal S1, S2. Vascular:  Vessel Right Left  Radial Palpable Palpable  Gastrointestinal: soft, non-tender/non-distended. No guarding/reflex.  Musculoskeletal: M/S 5/5 throughout.  Extremities without ischemic changes.  No deformity or atrophy.  Neurologic: Sensation grossly intact in extremities.  Symmetrical.  Speech is fluent. Motor exam as listed above. Psychiatric: Judgment intact, Mood & affect appropriate for pt's clinical situation. Dermatologic: No rashes or ulcers noted.  No cellulitis or open wounds. Lymph : No Cervical, Axillary, or Inguinal lymphadenopathy.   CBC Lab Results  Component Value Date   WBC 9.7 05/10/2018   HGB 8.1 (L) 05/10/2018   HCT 26.3 (L) 05/10/2018   MCV 88.3 05/10/2018   PLT 232 05/10/2018    BMET    Component Value Date/Time   NA 139 05/10/2018 0558   NA 142 01/27/2018 1353   K 3.1 (L) 05/10/2018 0558   CL 102 05/10/2018 0558   CO2 25 05/10/2018 0558   GLUCOSE 195 (H) 05/10/2018 0558   BUN 147 (H) 05/10/2018 0558   BUN 82 (HH) 01/27/2018 1353   CREATININE 3.99 (H) 05/10/2018 0558   CREATININE 1.36 (H) 09/23/2016 1451   CALCIUM 7.6 (L) 05/10/2018 0558   GFRNONAA 11 (L) 05/10/2018 0558   GFRNONAA 43 (L) 09/23/2016 1451   GFRAA 13 (L) 05/10/2018 0558   GFRAA 49 (L) 09/23/2016 1451   Estimated  Creatinine Clearance: 21.5 mL/min (A) (by C-G formula based on SCr of 3.99 mg/dL (H)).  COAG Lab Results  Component Value Date   INR 1.02 12/14/2017   INR 1.01 10/03/2017   INR 0.98 09/14/2017    Radiology Ct Abdomen Pelvis Wo Contrast  Result Date: 05/09/2018 CLINICAL DATA:  Left-sided sharp chest pain beginning yesterday with nausea. Diarrhea. EXAM: CT ABDOMEN AND PELVIS WITHOUT CONTRAST TECHNIQUE: Multidetector CT imaging of the abdomen and pelvis was performed following the standard protocol without IV contrast. COMPARISON:  03/08/2018 and 03/25/2012 FINDINGS: Lower chest: Minimal bibasilar scarring. Hepatobiliary: Previous cholecystectomy. Liver and biliary tree are otherwise within normal. Pancreas: 5 mm lipoma over the pancreatic head.  No acute changes. Spleen: Normal. Adrenals/Urinary Tract: Adrenal glands are normal. Kidneys are normal in size without hydronephrosis or nephrolithiasis.  A few small left renal cysts unchanged. Ureters and bladder are normal. Stomach/Bowel: Stomach and small bowel are normal. Appendix is normal. Minimal diverticulosis of the colon. Vascular/Lymphatic: Minimal calcified plaque over the abdominal aorta no adenopathy. Reproductive: Normal. Evidence of previous bilateral tubal ligation. Other: No free fluid or focal inflammatory change. Musculoskeletal: Minimal degenerative change of the spine and hips. IMPRESSION: No acute findings in the abdomen/pelvis. Few small left renal cysts unchanged. Mild colonic diverticulosis. Aortic Atherosclerosis (ICD10-I70.0). Electronically Signed   By: Marin Olp M.D.   On: 05/09/2018 22:17   Dg Chest 2 View  Result Date: 05/09/2018 CLINICAL DATA:  LEFT chest pain and nausea since yesterday. History of CHF. EXAM: CHEST - 2 VIEW COMPARISON:  CT chest March 08, 2018 FINDINGS: Cardiomediastinal silhouette is unremarkable for this low inspiratory examination with crowded vasculature markings. Mild bronchitic changes. The lungs  are clear without pleural effusions or focal consolidations. Trachea projects midline and there is no pneumothorax. Included soft tissue planes and osseous structures are non-suspicious. IMPRESSION: Mild bronchitic changes. Electronically Signed   By: Elon Alas M.D.   On: 05/09/2018 18:05    Assessment/Plan 1.  Complication dialysis device with thrombosis AV access:  Patient will require initiation of hemodialysis urgently.  Therefore tunneled catheter is being placed.  I do not believe that a temporary catheter placed in the femoral region would be wise given her diarrhea.  The risks and benefits were described to the patient.  All questions were answered.  The patient agrees to proceed with intervention and placement of a tunneled catheter.  2.  End-stage renal disease requiring hemodialysis:  Patient will begin dialysis therapy without further interruption. Dialysis has already been arranged. 3.  Hypertension:  Patient will continue medical management; nephrology is following no changes in oral medications. 4. Diabetes mellitus:  Glucose will be monitored and oral medications been held this morning once the patient has undergone the patient's procedure po intake will be reinitiated and again Accu-Cheks will be used to assess the blood glucose level and treat as needed. The patient will be restarted on the patient's usual hypoglycemic regime 5.  Coronary artery disease:  EKG will be monitored. Nitrates will be used if needed. The patient's oral cardiac medications will be continued.    Hortencia Pilar, MD  05/10/2018 6:23 PM

## 2018-05-10 NOTE — ED Notes (Signed)
Diet status verified on admission orders; sandwich tray and diet ginger ale given to pt as requested

## 2018-05-10 NOTE — Progress Notes (Signed)
Electrolyte replacement  10/29 K+ 3.1. KCl 20 mEq PO x 2 doses ordered. Recheck BMP with tomorrow AM labs.  Sim Boast, PharmD, BCPS  05/10/18 7:29 AM

## 2018-05-10 NOTE — Op Note (Signed)
OPERATIVE NOTE   PROCEDURE: 1. Insertion of tunneled dialysis catheter right IJ approach with ultrasound and fluoroscopic guidance.  PRE-OPERATIVE DIAGNOSIS: Acute on chronic renal insufficiency now requiring hemodialysis  POST-OPERATIVE DIAGNOSIS: Same  SURGEON: Hortencia Pilar.  ANESTHESIA: Conscious sedation was administered under my direct supervision by the interventional radiology RN. IV Versed plus fentanyl were utilized. Continuous ECG, pulse oximetry and blood pressure was monitored throughout the entire procedure. Conscious sedation was for a total of 37 minutes.  ESTIMATED BLOOD LOSS: Minimal cc  CONTRAST USED:  None  FLUOROSCOPY TIME: 1.5 minutes  INDICATIONS:   [name] a 62 y.o. y.o. female who presents with worsening renal function.  In the emergency room she was noted to have a BUN greater than 100 with a creatinine greater than 4.  She will require hemodialysis and therefore is undergoing placement of a tunneled catheter.  Risks and benefits of been reviewed all questions answered patient has agreed to proceed with tunneled catheter placement.  DESCRIPTION: After obtaining full informed written consent, the patient was positioned supine. The right neck and chest wall was prepped and draped in a sterile fashion. Ultrasound was placed in a sterile sleeve. Ultrasound was utilized to identify the right internal jugular vein which is noted to be echolucent and compressible indicating patency. Image is recorded for the permanent record. Under direct ultrasound visualization a micro-needle is inserted into the vein followed by the micro-wire. Micro-sheath was then advanced and a J wire is inserted without difficulty under fluoroscopic guidance. Small counterincision was made at the wire insertion site. Dilators are passed over the wire and the tunneled dialysis catheter is fed into the central venous system without difficulty.  Under fluoroscopy the catheter tip positioned  at the atrial caval junction. The catheter is then approximated to the chest wall and an exit site selected. 1% lidocaine is infiltrated in soft tissues at this level small incision is made and the tunneling device is then passed from the exit site to the neck counterincision. Catheter is then connected to the tunneling device and the catheter was pulled subcutaneously. It is then transected and the hub assembly connected without difficulty. Both lumens aspirate and flush easily. After verification of smooth contour with proper tip position under fluoroscopy the catheter is packed with 5000 units of heparin per lumen.  Catheter secured to the skin of the right chest with 0 silk. A sterile dressing is applied with a Biopatch.  COMPLICATIONS: None  CONDITION: Good  Hortencia Pilar [facility] renovascular. Office:  [phone]   05/10/2018,6:28 PM

## 2018-05-11 ENCOUNTER — Encounter: Payer: Self-pay | Admitting: Vascular Surgery

## 2018-05-11 LAB — GLUCOSE, CAPILLARY
Glucose-Capillary: 414 mg/dL — ABNORMAL HIGH (ref 70–99)
Glucose-Capillary: 429 mg/dL — ABNORMAL HIGH (ref 70–99)
Glucose-Capillary: 474 mg/dL — ABNORMAL HIGH (ref 70–99)
Glucose-Capillary: 499 mg/dL — ABNORMAL HIGH (ref 70–99)
Glucose-Capillary: 316 mg/dL — ABNORMAL HIGH (ref 70–99)
Glucose-Capillary: 308 mg/dL — ABNORMAL HIGH (ref 70–99)

## 2018-05-11 LAB — MAGNESIUM: Magnesium: 2.1 mg/dL (ref 1.7–2.4)

## 2018-05-11 LAB — POTASSIUM: Potassium: 3.8 mmol/L (ref 3.5–5.1)

## 2018-05-11 MED ORDER — INSULIN GLARGINE 100 UNIT/ML ~~LOC~~ SOLN
60.0000 [IU] | Freq: Two times a day (BID) | SUBCUTANEOUS | Status: DC
  Administered 2018-05-11 – 2018-05-13 (×5): 60 [IU] via SUBCUTANEOUS
  Filled 2018-05-11 (×8): qty 0.6

## 2018-05-11 MED ORDER — INSULIN ASPART 100 UNIT/ML ~~LOC~~ SOLN
35.0000 [IU] | Freq: Once | SUBCUTANEOUS | Status: AC
  Administered 2018-05-11: 35 [IU] via SUBCUTANEOUS
  Filled 2018-05-11: qty 1

## 2018-05-11 MED ORDER — INSULIN ASPART 100 UNIT/ML ~~LOC~~ SOLN
25.0000 [IU] | Freq: Three times a day (TID) | SUBCUTANEOUS | Status: DC
  Administered 2018-05-11 – 2018-05-14 (×5): 25 [IU] via SUBCUTANEOUS
  Filled 2018-05-11 (×5): qty 1

## 2018-05-11 MED ORDER — INSULIN GLARGINE 100 UNIT/ML ~~LOC~~ SOLN
50.0000 [IU] | Freq: Two times a day (BID) | SUBCUTANEOUS | Status: DC
  Administered 2018-05-11: 50 [IU] via SUBCUTANEOUS
  Filled 2018-05-11 (×2): qty 0.5

## 2018-05-11 MED ORDER — CIPROFLOXACIN HCL 500 MG PO TABS
500.0000 mg | ORAL_TABLET | ORAL | Status: AC
  Administered 2018-05-11 – 2018-05-13 (×3): 500 mg via ORAL
  Filled 2018-05-11 (×3): qty 1

## 2018-05-11 MED ORDER — INSULIN ASPART 100 UNIT/ML ~~LOC~~ SOLN
0.0000 [IU] | Freq: Three times a day (TID) | SUBCUTANEOUS | Status: DC
  Administered 2018-05-11: 15 [IU] via SUBCUTANEOUS
  Administered 2018-05-12: 7 [IU] via SUBCUTANEOUS
  Administered 2018-05-12 – 2018-05-13 (×2): 4 [IU] via SUBCUTANEOUS
  Administered 2018-05-13: 3 [IU] via SUBCUTANEOUS
  Administered 2018-05-14: 11 [IU] via SUBCUTANEOUS
  Filled 2018-05-11 (×6): qty 1

## 2018-05-11 MED ORDER — SODIUM CHLORIDE 0.9 % IV SOLN
1.0000 g | Freq: Once | INTRAVENOUS | Status: AC
  Administered 2018-05-11: 1 g via INTRAVENOUS
  Filled 2018-05-11: qty 1

## 2018-05-11 MED ORDER — INSULIN ASPART 100 UNIT/ML ~~LOC~~ SOLN: 15.0000 [IU] | Freq: Three times a day (TID) | SUBCUTANEOUS | Status: DC

## 2018-05-11 MED ORDER — INSULIN ASPART 100 UNIT/ML ~~LOC~~ SOLN
25.0000 [IU] | Freq: Once | SUBCUTANEOUS | Status: AC
  Administered 2018-05-11: 25 [IU] via SUBCUTANEOUS
  Filled 2018-05-11: qty 1

## 2018-05-11 MED ORDER — INSULIN ASPART 100 UNIT/ML ~~LOC~~ SOLN
0.0000 [IU] | Freq: Every day | SUBCUTANEOUS | Status: DC
  Administered 2018-05-11: 4 [IU] via SUBCUTANEOUS
  Administered 2018-05-12: 2 [IU] via SUBCUTANEOUS
  Filled 2018-05-11 (×2): qty 1

## 2018-05-11 NOTE — Care Management (Signed)
Notified that patient to be new outpatient HD.  Perm cath has been placed.  [name] HD liaison aware. Requested nephrology to order PPD if indicated.

## 2018-05-11 NOTE — Progress Notes (Signed)
Post HD assessment. Pt tolerated tx well without c/o or complication. Estill Bamberg with pt pathways at bedside. Net UF 8m, goal met. Dressing changed, lines heparin locked and clampped 1.5 x2.    05/11/18 1600  Vital Signs  Temp 97.7 F (36.5 C)  Temp Source Oral  Pulse Rate 88  Pulse Rate Source Monitor  Resp 14  BP (!) 180/74  BP Location Right Wrist  BP Method Automatic  Patient Position (if appropriate) Sitting  Oxygen Therapy  SpO2 98 %  O2 Device Room Air  Dialysis Weight  Weight 133 kg  Type of Weight Post-Dialysis  Post-Hemodialysis Assessment  Rinseback Volume (mL) 250 mL  KECN 23.5 V  Dialyzer Clearance Lightly streaked  Duration of HD Treatment -hour(s) 2 hour(s)  Hemodialysis Intake (mL) 500 mL  UF Total -Machine (mL) 503 mL  Net UF (mL) 3 mL  Tolerated HD Treatment Yes  Education / Care Plan  Dialysis Education Provided Yes  Documented Education in Care Plan Yes

## 2018-05-11 NOTE — Progress Notes (Signed)
[name] NAME: [name]    MR#:  [id number]  DATE OF BIRTH:  1955-08-06  SUBJECTIVE:  CHIEF COMPLAINT:   Chief Complaint  Patient presents with  . Chest Pain  . Abdominal Pain  Patient feeling better, continues to complain of abdominal pain, discussion with nephrology-to start hemodialysis on today, status post right IJ hemodialysis catheter placement by vascular surgery on yesterday, uncontrolled blood sugars noted-restart home regiment REVIEW OF SYSTEMS:  CONSTITUTIONAL: No fever, fatigue or weakness.  EYES: No blurred or double vision.  EARS, NOSE, AND THROAT: No tinnitus or ear pain.  RESPIRATORY: No cough, shortness of breath, wheezing or hemoptysis.  CARDIOVASCULAR: No chest pain, orthopnea, edema.  GASTROINTESTINAL: No nausea, vomiting, diarrhea or abdominal pain.  GENITOURINARY: No dysuria, hematuria.  ENDOCRINE: No polyuria, nocturia,  HEMATOLOGY: No anemia, easy bruising or bleeding SKIN: No rash or lesion. MUSCULOSKELETAL: No joint pain or arthritis.   NEUROLOGIC: No tingling, numbness, weakness.  PSYCHIATRY: No anxiety or depression.   ROS  DRUG ALLERGIES:   Allergies  Allergen Reactions  . Shellfish Allergy Anaphylaxis and Swelling  . Diazepam Other (See Comments)    "felt like out of body experience"  . Morphine And Related Itching    VITALS:  Blood pressure (!) 182/84, pulse 98, temperature 98 F (36.7 C), temperature source Oral, resp. rate 17, height 5\' 8"  (1.727 m), weight 133.1 kg, SpO2 95 %.  PHYSICAL EXAMINATION:  GENERAL:  62 y.o.-year-old patient lying in the bed with no acute distress.  EYES: Pupils equal, round, reactive to light and accommodation. No scleral icterus. Extraocular muscles intact.  HEENT: Head atraumatic, normocephalic. Oropharynx and nasopharynx clear.  NECK:  Supple, no jugular venous distention. No thyroid enlargement, no tenderness.  LUNGS: Normal breath sounds  bilaterally, no wheezing, rales,rhonchi or crepitation. No use of accessory muscles of respiration.  CARDIOVASCULAR: S1, S2 normal. No murmurs, rubs, or gallops.  ABDOMEN: Soft, nontender, nondistended. Bowel sounds present. No organomegaly or mass.  EXTREMITIES: No pedal edema, cyanosis, or clubbing.  NEUROLOGIC: Cranial nerves II through XII are intact. Muscle strength 5/5 in all extremities. Sensation intact. Gait not checked.  PSYCHIATRIC: The patient is alert and oriented x 3.  SKIN: No obvious rash, lesion, or ulcer.   Physical Exam LABORATORY PANEL:   CBC Recent Labs  Lab 05/10/18 0516  WBC 9.7  HGB 8.1*  HCT 26.3*  PLT 232   ------------------------------------------------------------------------------------------------------------------  Chemistries  Recent Labs  Lab 05/09/18 1715 05/10/18 0558  05/11/18 0443 05/11/18 0857  NA 136 139  --   --   --   K 3.3* 3.1*   < >  --  3.8  CL 98 102  --   --   --   CO2 25 25  --   --   --   GLUCOSE 179* 195*  --   --   --   BUN 142* 147*  --   --   --   CREATININE 4.15* 3.99*  --   --   --   CALCIUM 8.4* 7.6*  --   --   --   MG 1.2*  --    < > 2.1  --   AST 22  --   --   --   --   ALT 19  --   --   --   --   ALKPHOS 147*  --   --   --   --  BILITOT 0.6  --   --   --   --    < > = values in this interval not displayed.   ------------------------------------------------------------------------------------------------------------------  Cardiac Enzymes Recent Labs  Lab 05/09/18 1715  TROPONINI <0.03   ------------------------------------------------------------------------------------------------------------------  RADIOLOGY:  Ct Abdomen Pelvis Wo Contrast  Result Date: 05/09/2018 CLINICAL DATA:  Left-sided sharp chest pain beginning yesterday with nausea. Diarrhea. EXAM: CT ABDOMEN AND PELVIS WITHOUT CONTRAST TECHNIQUE: Multidetector CT imaging of the abdomen and pelvis was performed following the standard  protocol without IV contrast. COMPARISON:  03/08/2018 and 03/25/2012 FINDINGS: Lower chest: Minimal bibasilar scarring. Hepatobiliary: Previous cholecystectomy. Liver and biliary tree are otherwise within normal. Pancreas: 5 mm lipoma over the pancreatic head.  No acute changes. Spleen: Normal. Adrenals/Urinary Tract: Adrenal glands are normal. Kidneys are normal in size without hydronephrosis or nephrolithiasis. A few small left renal cysts unchanged. Ureters and bladder are normal. Stomach/Bowel: Stomach and small bowel are normal. Appendix is normal. Minimal diverticulosis of the colon. Vascular/Lymphatic: Minimal calcified plaque over the abdominal aorta no adenopathy. Reproductive: Normal. Evidence of previous bilateral tubal ligation. Other: No free fluid or focal inflammatory change. Musculoskeletal: Minimal degenerative change of the spine and hips. IMPRESSION: No acute findings in the abdomen/pelvis. Few small left renal cysts unchanged. Mild colonic diverticulosis. Aortic Atherosclerosis (ICD10-I70.0). Electronically Signed   By: Marin Olp M.D.   On: 05/09/2018 22:17   Dg Chest 2 View  Result Date: 05/09/2018 CLINICAL DATA:  LEFT chest pain and nausea since yesterday. History of CHF. EXAM: CHEST - 2 VIEW COMPARISON:  CT chest March 08, 2018 FINDINGS: Cardiomediastinal silhouette is unremarkable for this low inspiratory examination with crowded vasculature markings. Mild bronchitic changes. The lungs are clear without pleural effusions or focal consolidations. Trachea projects midline and there is no pneumothorax. Included soft tissue planes and osseous structures are non-suspicious. IMPRESSION: Mild bronchitic changes. Electronically Signed   By: Elon Alas M.D.   On: 05/09/2018 18:05    ASSESSMENT AND PLAN:  *Acute gastroenteritis GI panel noted for enteropathogenic E. coli  Cipro for 3-day course, Lactinex 3 times daily, antiemetics PRN   *New onset  ESRD Status post right IJ  hemodialysis catheter placement by vascular surgery on May 10, 2018  Nephrology to start with hemodialysis today, tentative plans for discharge on Friday   *History of CHF-without exacerbation Stable Continue Brilinta, Coreg  *Chronic diabetes mellitus type 2 Uncontrolled Restart Lantus 60 units twice daily, NovoLog 25 units 3 times daily with meals, increase to high-dose sliding scale insulin, continue Accu-Cheks per routine  *Hypertension Stable on home regiment  *Acute hypokalemia/hypomagnesemia Likely secondary to diarrhea Repleted  Discharge tentatively planned for on Friday  All the records are reviewed and case discussed with Care Management/Social Workerr. Management plans discussed with the patient, family and they are in agreement.  CODE STATUS: full  TOTAL TIME TAKING CARE OF THIS PATIENT: 40 minutes.   POSSIBLE D/C IN 1-2 DAYS, DEPENDING ON CLINICAL CONDITION.   Avel Peace Salary M.D on 05/11/2018   Between 7am to 6pm - Pager - [phone]  After 6pm go to [email or web address] - password [email or web address] [facility]  Office  [phone]  CC: Primary care physician; [name], PA-C  Note: This dictation was prepared with Dragon dictation along with smaller phrase technology. Any transcriptional errors that result from this process are unintentional.

## 2018-05-11 NOTE — Progress Notes (Signed)
Post HD assessment    05/11/18 1559  Neurological  Level of Consciousness Alert  Orientation Level Oriented X4  Respiratory  Respiratory Pattern Regular;Unlabored  Chest Assessment Chest expansion symmetrical  Cardiac  ECG Monitor Yes  Cardiac Rhythm NSR  Vascular  R Radial Pulse +2  L Radial Pulse +2  Edema Right lower extremity;Left lower extremity;Generalized  Integumentary  Integumentary (WDL) X  Skin Color Appropriate for ethnicity  Musculoskeletal  Musculoskeletal (WDL) X  Generalized Weakness Yes  Assistive Device None  GU Assessment  Genitourinary (WDL) X  Genitourinary Symptoms  (HD)  Psychosocial  Psychosocial (WDL) WDL

## 2018-05-11 NOTE — Progress Notes (Signed)
[facility]  ROUNDING NOTE   Subjective:   Seen and examined on hemodialysis. First treatment. Tolerated treatment well.   Stool with E. Coli - started on ciprofloxacin.     HEMODIALYSIS FLOWSHEET:  Blood Flow Rate (mL/min): 200 mL/min Arterial Pressure (mmHg): -70 mmHg Venous Pressure (mmHg): 60 mmHg Transmembrane Pressure (mmHg): 40 mmHg Ultrafiltration Rate (mL/min): 250 mL/min Dialysate Flow Rate (mL/min): 300 ml/min Conductivity: Machine : 13.9 Conductivity: Machine : 13.9 Dialysis Fluid Bolus: Normal Saline Bolus Amount (mL): 250 mL    Objective:  Vital signs in last 24 hours:  Temp:  [97.7 F (36.5 C)-98.7 F (37.1 C)] 97.7 F (36.5 C) (10/30 1600) Pulse Rate:  [80-98] 86 (10/30 1604) Resp:  [10-25] 11 (10/30 1604) BP: (140-182)/(66-102) 180/74 (10/30 1600) SpO2:  [94 %-100 %] 98 % (10/30 1600) Weight:  [131 kg-133.1 kg] 133 kg (10/30 1600)  Weight change: -0.09 kg Filed Weights   05/11/18 0514 05/11/18 1335 05/11/18 1600  Weight: 133.1 kg 133.1 kg 133 kg    Intake/Output: I/O last 3 completed shifts: In: 526 [P.O.:240; I.V.:236; IV Piggyback:50] Out: -    Intake/Output this shift:  Total I/O In: 100 [IV Piggyback:100] Out: 3 [Other:3]  Physical Exam: General: NAD,   Head: Normocephalic, atraumatic. Moist oral mucosal membranes  Eyes: Anicteric, PERRL  Neck: Supple, trachea midline  Lungs:  Bilateral crackles  Heart: Regular rate and rhythm  Abdomen:  +distended  Extremities:  trace peripheral edema.  Neurologic: Nonfocal, moving all four extremities  Skin: No lesions  Access: RIJ permcath 10/29 Dr. Delana Meyer    Basic Metabolic Panel: Recent Labs  Lab 05/09/18 1715 05/10/18 5053 05/10/18 9767 05/10/18 1417 05/10/18 1945 05/11/18 0443 05/11/18 0857  NA 136 139  --   --   --   --   --   K 3.3* 3.1*  --   --  4.1  --  3.8  CL 98 102  --   --   --   --   --   CO2 25 25  --   --   --   --   --   GLUCOSE 179* 195*  --   --    --   --   --   BUN 142* 147*  --   --   --   --   --   CREATININE 4.15* 3.99*  --   --   --   --   --   CALCIUM 8.4* 7.6*  --   --   --   --   --   MG 1.2*  --  1.3*  --   --  2.1  --   PHOS  --   --   --  5.5*  --   --   --     Liver Function Tests: Recent Labs  Lab 05/09/18 1715  AST 22  ALT 19  ALKPHOS 147*  BILITOT 0.6  PROT 7.8  ALBUMIN 3.5   Recent Labs  Lab 05/09/18 1715  LIPASE 52*   No results for input(s): AMMONIA in the last 168 hours.  CBC: Recent Labs  Lab 05/09/18 1715 05/10/18 0516  WBC 11.0* 9.7  HGB 8.6* 8.1*  HCT 27.4* 26.3*  MCV 87.8 88.3  PLT 248 232    Cardiac Enzymes: Recent Labs  Lab 05/09/18 1715  TROPONINI <0.03    BNP: Invalid input(s): POCBNP  CBG: Recent Labs  Lab 05/11/18 0743 05/11/18 0753 05/11/18 1125 05/11/18 1149 05/11/18 1658  [place]    Microbiology: Results for orders placed or performed during the hospital encounter of 05/09/18  C difficile quick scan w PCR reflex     Status: Abnormal   Collection Time: 05/09/18  9:46 PM  Result Value Ref Range Status   C Diff antigen POSITIVE (A) NEGATIVE Final   C Diff toxin NEGATIVE NEGATIVE Final   C Diff interpretation Results are indeterminate. See PCR results.  Final    Comment: Performed at [facility], [address] [place], [place] [address]  C. Diff by PCR, Reflexed     Status: None   Collection Time: 05/09/18  9:46 PM  Result Value Ref Range Status   Toxigenic C. Difficile by PCR NEGATIVE NEGATIVE Final    Comment: Patient is colonized with non toxigenic C. difficile. May not need treatment unless significant symptoms are present. Performed at [facility], [address] [place], [place] [address]   Gastrointestinal Panel by PCR , Stool     Status: Abnormal   Collection Time: 05/10/18  8:34 AM  Result Value Ref Range Status   Campylobacter species NOT DETECTED NOT DETECTED Final   Plesimonas shigelloides  NOT DETECTED NOT DETECTED Final   Salmonella species NOT DETECTED NOT DETECTED Final   Yersinia enterocolitica NOT DETECTED NOT DETECTED Final   Vibrio species NOT DETECTED NOT DETECTED Final   Vibrio cholerae NOT DETECTED NOT DETECTED Final   Enteroaggregative E coli (EAEC) NOT DETECTED NOT DETECTED Final   Enteropathogenic E coli (EPEC) DETECTED (A) NOT DETECTED Final    Comment: CRITICAL RESULT CALLED TO, READ BACK BY AND VERIFIED WITH: JOSH SIMSER AT 1504 ON 05/10/18,KP    Enterotoxigenic E coli (ETEC) NOT DETECTED NOT DETECTED Final   Shiga like toxin producing E coli (STEC) NOT DETECTED NOT DETECTED Final   Shigella/Enteroinvasive E coli (EIEC) NOT DETECTED NOT DETECTED Final   Cryptosporidium NOT DETECTED NOT DETECTED Final   Cyclospora cayetanensis NOT DETECTED NOT DETECTED Final   Entamoeba histolytica NOT DETECTED NOT DETECTED Final   Giardia lamblia NOT DETECTED NOT DETECTED Final   Adenovirus F40/41 NOT DETECTED NOT DETECTED Final   Astrovirus NOT DETECTED NOT DETECTED Final   Norovirus GI/GII NOT DETECTED NOT DETECTED Final   Rotavirus A NOT DETECTED NOT DETECTED Final   Sapovirus (I, II, IV, and V) NOT DETECTED NOT DETECTED Final    Comment: Performed at [facility], [address] [place], [place] [address]    Coagulation Studies: No results for input(s): LABPROT, INR in the last 72 hours.  Urinalysis: Recent Labs    05/09/18 2146  COLORURINE YELLOW*  LABSPEC 1.009  PHURINE 5.0  GLUCOSEU NEGATIVE  HGBUR NEGATIVE  BILIRUBINUR NEGATIVE  KETONESUR NEGATIVE  PROTEINUR NEGATIVE  NITRITE NEGATIVE  LEUKOCYTESUR NEGATIVE      Imaging: Ct Abdomen Pelvis Wo Contrast  Result Date: 05/09/2018 CLINICAL DATA:  Left-sided sharp chest pain beginning yesterday with nausea. Diarrhea. EXAM: CT ABDOMEN AND PELVIS WITHOUT CONTRAST TECHNIQUE: Multidetector CT imaging of the abdomen and pelvis was performed following the standard protocol without IV contrast.  COMPARISON:  03/08/2018 and 03/25/2012 FINDINGS: Lower chest: Minimal bibasilar scarring. Hepatobiliary: Previous cholecystectomy. Liver and biliary tree are otherwise within normal. Pancreas: 5 mm lipoma over the pancreatic head.  No acute changes. Spleen: Normal. Adrenals/Urinary Tract: Adrenal glands are normal. Kidneys are normal in size without hydronephrosis or nephrolithiasis. A few small left renal cysts unchanged. Ureters and bladder are normal. Stomach/Bowel: Stomach and  small bowel are normal. Appendix is normal. Minimal diverticulosis of the colon. Vascular/Lymphatic: Minimal calcified plaque over the abdominal aorta no adenopathy. Reproductive: Normal. Evidence of previous bilateral tubal ligation. Other: No free fluid or focal inflammatory change. Musculoskeletal: Minimal degenerative change of the spine and hips. IMPRESSION: No acute findings in the abdomen/pelvis. Few small left renal cysts unchanged. Mild colonic diverticulosis. Aortic Atherosclerosis (ICD10-I70.0). Electronically Signed   By: Marin Olp M.D.   On: 05/09/2018 22:17   Dg Chest 2 View  Result Date: 05/09/2018 CLINICAL DATA:  LEFT chest pain and nausea since yesterday. History of CHF. EXAM: CHEST - 2 VIEW COMPARISON:  CT chest March 08, 2018 FINDINGS: Cardiomediastinal silhouette is unremarkable for this low inspiratory examination with crowded vasculature markings. Mild bronchitic changes. The lungs are clear without pleural effusions or focal consolidations. Trachea projects midline and there is no pneumothorax. Included soft tissue planes and osseous structures are non-suspicious. IMPRESSION: Mild bronchitic changes. Electronically Signed   By: Elon Alas M.D.   On: 05/09/2018 18:05     Medications:   .  ceFAZolin (ANCEF) IV     . allopurinol  100 mg Oral Daily  . aspirin EC  81 mg Oral Daily  . atorvastatin  80 mg Oral Daily  . carvedilol  25 mg Oral BID WC  . Chlorhexidine Gluconate Cloth  6 each  Topical Q0600  . ciprofloxacin  500 mg Oral Q24H  . diphenhydrAMINE  50 mg Intravenous Once  . ferrous sulfate  325 mg Oral Q breakfast  . heparin  5,000 Units Subcutaneous Q8H  . hydrALAZINE  100 mg Oral TID  . hydrocortisone cream   Topical BID  . insulin aspart  0-20 Units Subcutaneous TID WC  . insulin aspart  0-5 Units Subcutaneous QHS  . insulin aspart  25 Units Subcutaneous TID WC  . insulin glargine  60 Units Subcutaneous BID  . isosorbide mononitrate  90 mg Oral Daily  . lactobacillus  1 g Oral TID WC  . loratadine  10 mg Oral Daily  . magnesium chloride  1 tablet Oral BID  . methylPREDNISolone (SOLU-MEDROL) injection  125 mg Intravenous Once  . mometasone-formoterol  2 puff Inhalation BID  . montelukast  10 mg Oral QHS  . pantoprazole  40 mg Oral Daily  . pregabalin  75 mg Oral BID  . ticagrelor  90 mg Oral BID  . valACYclovir  500 mg Oral QODAY   acetaminophen **OR** acetaminophen, albuterol, bisacodyl, fluticasone, hydrALAZINE, HYDROcodone-acetaminophen, hydrOXYzine, nitroGLYCERIN, ondansetron **OR** ondansetron (ZOFRAN) IV, traZODone  Assessment/ Plan:  [name] is a 62 y.o. black female with hypertension, coronary artery disease, congestive heart failure with diastolic dysfunction, diabetes mellitus type II, chronic hepatitis C, peptic ulcer disease, obstructive sleep apnea who presents for follow up  1. End Stage Renal Disease secondary to hypertension and diabetes. - Hemodialysis started today - Check hepatitis screen and CXR  2. Hypertension:  - hold diuretics  3. Diabetes mellitus type II with chronic kidney disease: Insulin dependent. Not well controlled. Hemoglobin A1c 11.1% on 04/11/18 - restart home medications  4. Gout: no recent acute flares - Continue allopurinol   5. Anemia with chronic kidney disease: hemoglobin 8.1 normocytic - EPO with HD treatments TTS    6. Secondary Hyperparathyroidism: PTH 341 on 04/11/18. Hypocalcemia today.  -  calcium acetate discussed, will start after gastroenteritis resolves.    LOS: 2 [name] 10/30/20195:12 PM

## 2018-05-11 NOTE — Progress Notes (Signed)
HD tx start    05/11/18 1350  Vital Signs  Pulse Rate 92  Pulse Rate Source Monitor  Resp 18  BP (!) 181/71  BP Location Right Wrist  BP Method Automatic  Patient Position (if appropriate) Sitting  Oxygen Therapy  SpO2 98 %  O2 Device Room Air  During Hemodialysis Assessment  Blood Flow Rate (mL/min) 200 mL/min  Arterial Pressure (mmHg) -60 mmHg  Venous Pressure (mmHg) 60 mmHg  Transmembrane Pressure (mmHg) 40 mmHg  Ultrafiltration Rate (mL/min) 250 mL/min  Dialysate Flow Rate (mL/min) 300 ml/min  Conductivity: Machine  14  HD Safety Checks Performed Yes  Dialysis Fluid Bolus Normal Saline  Bolus Amount (mL) 250 mL  Intra-Hemodialysis Comments Tx initiated

## 2018-05-11 NOTE — Progress Notes (Signed)
HD tx end   05/11/18 1553  Vital Signs  Pulse Rate 82  Pulse Rate Source Monitor  Resp 18  BP (!) 176/89  BP Location Right Wrist  BP Method Automatic  Patient Position (if appropriate) Sitting  Oxygen Therapy  SpO2 98 %  O2 Device Room Air  During Hemodialysis Assessment  Dialysis Fluid Bolus Normal Saline  Bolus Amount (mL) 250 mL  Intra-Hemodialysis Comments Tx completed

## 2018-05-11 NOTE — Progress Notes (Signed)
Pre HD assessment    05/11/18 1336  Neurological  Level of Consciousness Alert  Orientation Level Oriented X4  Respiratory  Respiratory Pattern Regular;Unlabored  Chest Assessment Chest expansion symmetrical  Cardiac  ECG Monitor Yes  Cardiac Rhythm NSR  Vascular  R Radial Pulse +2  L Radial Pulse +2  Edema Right lower extremity;Left lower extremity;Generalized  Integumentary  Integumentary (WDL) X  Skin Color Appropriate for ethnicity  Musculoskeletal  Musculoskeletal (WDL) X  Generalized Weakness Yes  Assistive Device None  GU Assessment  Genitourinary (WDL) X  Genitourinary Symptoms  (HD)  Psychosocial  Psychosocial (WDL) WDL

## 2018-05-11 NOTE — Progress Notes (Signed)
Pre HD assessment   05/11/18 1335  Vital Signs  Temp 98.7 F (37.1 C)  Temp Source Oral  Pulse Rate 89  Pulse Rate Source Monitor  Resp 15  BP (!) 160/78  BP Location Right Wrist  BP Method Automatic  Patient Position (if appropriate) Sitting  Oxygen Therapy  SpO2 98 %  O2 Device Room Air  Pain Assessment  Pain Scale 0-10  Pain Score 8  Pain Type Surgical pain  Pain Location Neck  Pain Orientation Right  Pain Descriptors / Indicators Sore;Tender  Pain Intervention(s) RN made aware  Dialysis Weight  Weight 133.1 kg  Type of Weight Pre-Dialysis  Time-Out for Hemodialysis  What Procedure? HD  Pt Identifiers(min of two) First/Last Name;MRN/Account#  Correct Site? Yes  Correct Side? Yes  Correct Procedure? Yes  Consents Verified? Yes  Rad Studies Available? N/A  Safety Precautions Reviewed? Yes  Engineer, civil (consulting) Number  (1A)  Station Number 3  UF/Alarm Test Passed  Conductivity: Meter 13.8  Conductivity: Machine  14  pH 7.6  Reverse Osmosis main  Normal Saline Lot Number [id number]  Dialyzer Lot Number [id number]  Disposable Set Lot Number [id number]  Machine Temperature 98.6 F (37 C)  Musician and Audible Yes  Blood Lines Intact and Secured Yes  Pre Treatment Patient Checks  Vascular access used during treatment Catheter  Patient is receiving dialysis in a chair Yes  Hepatitis B Surface Antigen Results Negative  Date Hepatitis B Surface Antigen Drawn 04/11/18  Hepatitis B Surface Antibody  (<10)  Date Hepatitis B Surface Antibody Drawn 04/11/18  Hemodialysis Consent Verified Yes  Hemodialysis Standing Orders Initiated Yes  ECG (Telemetry) Monitor On Yes  Prime Ordered Normal Saline  Length of  DialysisTreatment -hour(s) 2 Hour(s)  Dialyzer Elisio 17H NR  Dialysate 3K, 2.5 Ca  Dialysis Anticoagulant None  Dialysate Flow Ordered 300  Blood Flow Rate Ordered 200 mL/min  Ultrafiltration Goal 0 Liters  Pre Treatment Labs Hepatitis B Surface Antigen   Dialysis Blood Pressure Support Ordered Normal Saline  Education / Care Plan  Dialysis Education Provided Yes  Documented Education in Care Plan Yes

## 2018-05-12 LAB — GLUCOSE, CAPILLARY
Glucose-Capillary: 229 mg/dL — ABNORMAL HIGH (ref 70–99)
Glucose-Capillary: 183 mg/dL — ABNORMAL HIGH (ref 70–99)
Glucose-Capillary: 100 mg/dL — ABNORMAL HIGH (ref 70–99)
Glucose-Capillary: 206 mg/dL — ABNORMAL HIGH (ref 70–99)

## 2018-05-12 LAB — HEPATITIS B SURFACE ANTIGEN: Hepatitis B Surface Ag: NEGATIVE

## 2018-05-12 NOTE — Progress Notes (Signed)
HD Treatment Complete    05/12/18 1725  Vital Signs  Pulse Rate 77  Pulse Rate Source Monitor  Resp (!) 24  BP (!) 163/63  BP Location Left Arm  BP Method Automatic  Patient Position (if appropriate) Sitting  Oxygen Therapy  SpO2 100 %  O2 Device Room Air  During Hemodialysis Assessment  Blood Flow Rate (mL/min) 250 mL/min  Arterial Pressure (mmHg) -100 mmHg  Venous Pressure (mmHg) 80 mmHg  Transmembrane Pressure (mmHg) 60 mmHg  Ultrafiltration Rate (mL/min) 600 mL/min  Dialysate Flow Rate (mL/min) 500 ml/min  Conductivity: Machine  13.9  HD Safety Checks Performed Yes  Intra-Hemodialysis Comments Progressing as prescribed;Tolerated well;Tx completed (UF 1514)

## 2018-05-12 NOTE — Progress Notes (Signed)
Pre HD Assessment    05/12/18 1440  Neurological  Level of Consciousness Alert  Orientation Level Oriented X4  Respiratory  Respiratory Pattern Regular;Unlabored;Symmetrical  Chest Assessment Chest expansion symmetrical  Cardiac  Pulse Regular  Heart Sounds S1, S2  Jugular Venous Distention (JVD) No  ECG Monitor Yes  Cardiac Rhythm NSR  Antiarrhythmic device No  Vascular  R Radial Pulse +2  L Radial Pulse +2  R Dorsalis Pedis Pulse +1  L Dorsalis Pedis Pulse +1  Edema Right lower extremity  RLE Edema +2  LLE Edema +2  Integumentary  Integumentary (WDL) X  Skin Integrity Abrasion (Right leg)  Musculoskeletal  Musculoskeletal (WDL) X  Generalized Weakness Yes  GU Assessment  Genitourinary (WDL) X (HD pt)  Psychosocial  Psychosocial (WDL) WDL

## 2018-05-12 NOTE — Progress Notes (Signed)
[facility]  ROUNDING NOTE   Subjective:   Patient states she is feeling better and states her appetite and weakness are improving.   First hemodialysis treatment yesterday. Tolerated treatment well. UF of even.   Second treatment for later today.    Objective:  Vital signs in last 24 hours:  Temp:  [97.1 F (36.2 C)-98.1 F (36.7 C)] 97.1 F (36.2 C) (10/31 1252) Pulse Rate:  [79-92] 81 (10/31 1252) Resp:  [10-25] 16 (10/31 1252) BP: (146-181)/(66-89) 170/66 (10/31 1252) SpO2:  [97 %-100 %] 100 % (10/31 1252) Weight:  [133 kg-136 kg] 136 kg (10/31 0500)  Weight change: 2.1 kg Filed Weights   05/11/18 1335 05/11/18 1600 05/12/18 0500  Weight: 133.1 kg 133 kg 136 kg    Intake/Output: I/O last 3 completed shifts: In: 157.5 [I.V.:57.5; IV Piggyback:100] Out: 3 [Other:3]   Intake/Output this shift:  Total I/O In: 240 [P.O.:240] Out: -   Physical Exam: General: NAD,   Head: Normocephalic, atraumatic. Moist oral mucosal membranes  Eyes: Anicteric, PERRL  Neck: Supple, trachea midline  Lungs:  Bilateral crackles  Heart: Regular rate and rhythm  Abdomen:  +soft  Extremities:  trace peripheral edema.  Neurologic: Nonfocal, moving all four extremities  Skin: No lesions  Access: RIJ permcath 10/29 Dr. Delana Meyer    Basic Metabolic Panel: Recent Labs  Lab 05/09/18 1715 05/10/18 7342 05/10/18 8768 05/10/18 1417 05/10/18 1945 05/11/18 0443 05/11/18 0857  NA 136 139  --   --   --   --   --   K 3.3* 3.1*  --   --  4.1  --  3.8  CL 98 102  --   --   --   --   --   CO2 25 25  --   --   --   --   --   GLUCOSE 179* 195*  --   --   --   --   --   BUN 142* 147*  --   --   --   --   --   CREATININE 4.15* 3.99*  --   --   --   --   --   CALCIUM 8.4* 7.6*  --   --   --   --   --   MG 1.2*  --  1.3*  --   --  2.1  --   PHOS  --   --   --  5.5*  --   --   --     Liver Function Tests: Recent Labs  Lab 05/09/18 1715  AST 22  ALT 19  ALKPHOS 147*   BILITOT 0.6  PROT 7.8  ALBUMIN 3.5   Recent Labs  Lab 05/09/18 1715  LIPASE 52*   No results for input(s): AMMONIA in the last 168 hours.  CBC: Recent Labs  Lab 05/09/18 1715 05/10/18 0516  WBC 11.0* 9.7  HGB 8.6* 8.1*  HCT 27.4* 26.3*  MCV 87.8 88.3  PLT 248 232    Cardiac Enzymes: Recent Labs  Lab 05/09/18 1715  TROPONINI <0.03    BNP: Invalid input(s): POCBNP  CBG: Recent Labs  Lab 05/11/18 1149 05/11/18 1658 05/11/18 2111 05/12/18 0746 05/12/18 1159  GLUCAP 499* 316* 308* 229* 183*    Microbiology: Results for orders placed or performed during the hospital encounter of 05/09/18  C difficile quick scan w PCR reflex     Status: Abnormal   Collection Time: 05/09/18  9:46 PM  Result Value Ref Range Status   C Diff antigen POSITIVE (A) NEGATIVE Final   C Diff toxin NEGATIVE NEGATIVE Final   C Diff interpretation Results are indeterminate. See PCR results.  Final    Comment: Performed at [facility], [address] [place], [place] [address]  C. Diff by PCR, Reflexed     Status: None   Collection Time: 05/09/18  9:46 PM  Result Value Ref Range Status   Toxigenic C. Difficile by PCR NEGATIVE NEGATIVE Final    Comment: Patient is colonized with non toxigenic C. difficile. May not need treatment unless significant symptoms are present. Performed at [facility], [address] [place], [place] [address]   Gastrointestinal Panel by PCR , Stool     Status: Abnormal   Collection Time: 05/10/18  8:34 AM  Result Value Ref Range Status   Campylobacter species NOT DETECTED NOT DETECTED Final   Plesimonas shigelloides NOT DETECTED NOT DETECTED Final   Salmonella species NOT DETECTED NOT DETECTED Final   Yersinia enterocolitica NOT DETECTED NOT DETECTED Final   Vibrio species NOT DETECTED NOT DETECTED Final   Vibrio cholerae NOT DETECTED NOT DETECTED Final   Enteroaggregative E coli (EAEC) NOT DETECTED NOT DETECTED Final    Enteropathogenic E coli (EPEC) DETECTED (A) NOT DETECTED Final    Comment: CRITICAL RESULT CALLED TO, READ BACK BY AND VERIFIED WITH: JOSH SIMSER AT 1504 ON 05/10/18,KP    Enterotoxigenic E coli (ETEC) NOT DETECTED NOT DETECTED Final   Shiga like toxin producing E coli (STEC) NOT DETECTED NOT DETECTED Final   Shigella/Enteroinvasive E coli (EIEC) NOT DETECTED NOT DETECTED Final   Cryptosporidium NOT DETECTED NOT DETECTED Final   Cyclospora cayetanensis NOT DETECTED NOT DETECTED Final   Entamoeba histolytica NOT DETECTED NOT DETECTED Final   Giardia lamblia NOT DETECTED NOT DETECTED Final   Adenovirus F40/41 NOT DETECTED NOT DETECTED Final   Astrovirus NOT DETECTED NOT DETECTED Final   Norovirus GI/GII NOT DETECTED NOT DETECTED Final   Rotavirus A NOT DETECTED NOT DETECTED Final   Sapovirus (I, II, IV, and V) NOT DETECTED NOT DETECTED Final    Comment: Performed at [facility], [address] [place], [place] [address]    Coagulation Studies: No results for input(s): LABPROT, INR in the last 72 hours.  Urinalysis: Recent Labs    05/09/18 2146  COLORURINE YELLOW*  LABSPEC 1.009  PHURINE 5.0  GLUCOSEU NEGATIVE  HGBUR NEGATIVE  BILIRUBINUR NEGATIVE  KETONESUR NEGATIVE  PROTEINUR NEGATIVE  NITRITE NEGATIVE  LEUKOCYTESUR NEGATIVE      Imaging: No results found.   Medications:    . allopurinol  100 mg Oral Daily  . aspirin EC  81 mg Oral Daily  . atorvastatin  80 mg Oral Daily  . carvedilol  25 mg Oral BID WC  . Chlorhexidine Gluconate Cloth  6 each Topical Q0600  . ciprofloxacin  500 mg Oral Q24H  . diphenhydrAMINE  50 mg Intravenous Once  . ferrous sulfate  325 mg Oral Q breakfast  . heparin  5,000 Units Subcutaneous Q8H  . hydrALAZINE  100 mg Oral TID  . hydrocortisone cream   Topical BID  . insulin aspart  0-20 Units Subcutaneous TID WC  . insulin aspart  0-5 Units Subcutaneous QHS  . insulin aspart  25 Units Subcutaneous TID WC  . insulin glargine   60 Units Subcutaneous BID  . isosorbide mononitrate  90 mg Oral Daily  . lactobacillus  1 g Oral TID  WC  . loratadine  10 mg Oral Daily  . methylPREDNISolone (SOLU-MEDROL) injection  125 mg Intravenous Once  . mometasone-formoterol  2 puff Inhalation BID  . montelukast  10 mg Oral QHS  . pantoprazole  40 mg Oral Daily  . pregabalin  75 mg Oral BID  . ticagrelor  90 mg Oral BID  . valACYclovir  500 mg Oral QODAY   acetaminophen **OR** acetaminophen, albuterol, bisacodyl, fluticasone, hydrALAZINE, HYDROcodone-acetaminophen, hydrOXYzine, nitroGLYCERIN, ondansetron **OR** ondansetron (ZOFRAN) IV, traZODone  Assessment/ Plan:  [name] is a 62 y.o. black female with hypertension, coronary artery disease, congestive heart failure with diastolic dysfunction, diabetes mellitus type II, chronic hepatitis C, peptic ulcer disease, obstructive sleep apnea who presents for follow up  1. End Stage Renal Disease secondary to hypertension and diabetes. First hemodialysis treatment on 10/30 - Second treatment for later today.   2. Hypertension: elevated - hold diuretics  3. Diabetes mellitus type II with chronic kidney disease: Insulin dependent. Not well controlled. Hemoglobin A1c 11.1% on 04/11/18  4. Gout: no recent acute flares - Continue allopurinol   5. Anemia with chronic kidney disease: hemoglobin 8.1 normocytic - EPO with HD treatments TTS    6. Secondary Hyperparathyroidism: PTH 341 on 04/11/18. Hypocalcemia today.  - calcium acetate discussed, will start after gastroenteritis resolves.    LOS: 3 Zaley Talley 10/31/20191:47 PM

## 2018-05-12 NOTE — Progress Notes (Addendum)
Post HD Treatment  Pt tolerated treatment well. Her net UF was 1014 and BVP was 37.3. All blood was returned back to patient. No complaints noted at this time. Report called to primary RN.      05/12/18 1730  Hand-Off documentation  Report given to (Full Name) Hoyle Sauer, RN  Report received from (Full Name) Stephannie Peters, RN  Vital Signs  Temp 98.3 F (36.8 C)  Temp Source Oral  Pulse Rate 78  Pulse Rate Source Monitor  Resp 14  BP (!) 152/69  BP Location Left Arm  BP Method Automatic  Patient Position (if appropriate) Sitting  Oxygen Therapy  SpO2 99 %  O2 Device Room Air  Pain Assessment  Pain Scale 0-10  Pain Score 0  Dialysis Weight  Weight 132.9 kg  Type of Weight Post-Dialysis  Post-Hemodialysis Assessment  Rinseback Volume (mL) 250 mL  KECN 191 V  Dialyzer Clearance Lightly streaked  Duration of HD Treatment -hour(s) 2.5 hour(s)  Hemodialysis Intake (mL) 500 mL  UF Total -Machine (mL) 1514 mL  Net UF (mL) 1014 mL  Tolerated HD Treatment Yes  Post-Hemodialysis Comments Pt tolerated treatment well  Hemodialysis Catheter Right Subclavian Double-lumen  Placement Date/Time: (c) (c)   Placed prior to admission: (c) No  Orientation: Right  Access Location: Subclavian  Hemodialysis Catheter Type: Double-lumen  Site Condition No complications  Blue Lumen Status Capped (Central line);Heparin locked  Red Lumen Status Capped (Central line);Heparin locked  Purple Lumen Status N/A  Catheter fill solution Heparin 1000 units/ml  Catheter fill volume (Arterial) 1.5 cc  Catheter fill volume (Venous) 1.5  Dressing Type Biopatch;Occlusive  Dressing Status Clean;Dry;Intact  Post treatment catheter status Capped and Clamped

## 2018-05-12 NOTE — Progress Notes (Signed)
Pre HD Treatment    05/12/18 1427  Vital Signs  Temp 97.9 F (36.6 C)  Temp Source Oral  Pulse Rate 82  Pulse Rate Source Monitor  Resp 20  BP (!) 148/45  BP Location Left Arm  BP Method Automatic  Patient Position (if appropriate) Sitting  Oxygen Therapy  SpO2 100 %  O2 Device Room Air  Pain Assessment  Pain Scale 0-10  Pain Score 0  Dialysis Weight  Weight 134.5 kg  Type of Weight Pre-Dialysis  Time-Out for Hemodialysis  What Procedure? HD  Pt Identifiers(min of two) First/Last Name;MRN/Account#;Pt's DOB(use if MRN/Acct# not available  Correct Site? Yes  Correct Side? Yes  Correct Procedure? Yes  Consents Verified? Yes  Rad Studies Available? N/A  Safety Precautions Reviewed? Yes  Engineer, civil (consulting) Number 4  Station Number 4  UF/Alarm Test Passed  Conductivity: Meter 13.9  Conductivity: Machine  14  pH 7.4  Reverse Osmosis Main  Normal Saline Lot Number [id number]  Dialyzer Lot Number [id number]  Disposable Set Lot Number [id number]  Machine Temperature 98.6 F (37 C)  Musician and Audible Yes  Blood Lines Intact and Secured Yes  Pre Treatment Patient Checks  Vascular access used during treatment Catheter  Patient is receiving dialysis in a chair Yes  Hepatitis B Surface Antigen Results Negative  Date Hepatitis B Surface Antigen Drawn 05/11/18  Hepatitis B Surface Antibody  (<10)  Date Hepatitis B Surface Antibody Drawn 04/11/18  Hemodialysis Consent Verified Yes  Hemodialysis Standing Orders Initiated Yes  ECG (Telemetry) Monitor On Yes  Prime Ordered Normal Saline  Length of  DialysisTreatment -hour(s) 2.5 Hour(s)  Dialyzer Elisio 17H NR  Dialysate 3K, 2.5 Ca  Dialysis Anticoagulant None  Dialysate Flow Ordered 500  Blood Flow Rate Ordered 250 mL/min  Ultrafiltration Goal 1 Liters  Pre Treatment Labs Renal panel;CBC  Dialysis Blood Pressure Support Ordered Normal Saline  Education / Care Plan  Dialysis Education Provided Yes  Documented  Education in Care Plan Yes  Hemodialysis Catheter Right Subclavian Double-lumen  Placement Date/Time: (c) (c)   Placed prior to admission: (c) No  Orientation: Right  Access Location: Subclavian  Hemodialysis Catheter Type: Double-lumen  Site Condition No complications  Blue Lumen Status Capped (Central line)  Red Lumen Status Capped (Central line)  Purple Lumen Status N/A  Dressing Type Biopatch;Occlusive  Dressing Status Clean;Dry;Intact

## 2018-05-12 NOTE — Progress Notes (Signed)
Post HD Assessment    05/12/18 1735  Neurological  Level of Consciousness Alert  Orientation Level Oriented X4  Respiratory  Respiratory Pattern Regular;Unlabored;Symmetrical  Chest Assessment Chest expansion symmetrical  Bilateral Breath Sounds Diminished  Cardiac  Pulse Regular  Heart Sounds S1, S2  Jugular Venous Distention (JVD) No  ECG Monitor Yes  Cardiac Rhythm NSR  Antiarrhythmic device No  Vascular  R Radial Pulse +2  L Radial Pulse +2  R Dorsalis Pedis Pulse +1  L Dorsalis Pedis Pulse +1  Edema Right lower extremity  RLE Edema +2  LLE Edema +2  Integumentary  Integumentary (WDL) X  Skin Integrity Abrasion (Right leg)  Musculoskeletal  Musculoskeletal (WDL) X  Generalized Weakness Yes  GU Assessment  Genitourinary (WDL) X (HD pt)  Psychosocial  Psychosocial (WDL) WDL

## 2018-05-12 NOTE — Progress Notes (Signed)
[place] [facility] [place] NAME: [name]    MR#:  [id number]  DATE OF BIRTH:  28-Aug-1955  SUBJECTIVE:   Patient doing well this morning.  Going for dialysis later today.  REVIEW OF SYSTEMS:    Review of Systems  Constitutional: Negative for fever, chills weight loss HENT: Negative for ear pain, nosebleeds, congestion, facial swelling, rhinorrhea, neck pain, neck stiffness and ear discharge.   Respiratory: Negative for cough, shortness of breath, wheezing  Cardiovascular: Negative for chest pain, palpitations and leg swelling.  Gastrointestinal: Negative for heartburn, abdominal pain, vomiting, diarrhea or consitpation Genitourinary: Negative for dysuria, urgency, frequency, hematuria Musculoskeletal: Negative for back pain or joint pain Neurological: Negative for dizziness, seizures, syncope, focal weakness,  numbness and headaches.  Hematological: Does not bruise/bleed easily.  Psychiatric/Behavioral: Negative for hallucinations, confusion, dysphoric mood    Tolerating Diet: yes      DRUG ALLERGIES:   Allergies  Allergen Reactions  . Shellfish Allergy Anaphylaxis and Swelling  . Diazepam Other (See Comments)    "felt like out of body experience"  . Morphine And Related Itching    VITALS:  Blood pressure (!) 146/68, pulse 79, temperature 98.1 F (36.7 C), temperature source Oral, resp. rate 20, height 5\' 8"  (1.727 m), weight 136 kg, SpO2 98 %.  PHYSICAL EXAMINATION:  Constitutional: Appears well-developed and well-nourished. No distress. HENT: Normocephalic. [name] Oropharynx is clear and moist.  Eyes: Conjunctivae and EOM are normal. PERRLA, no scleral icterus.  Neck: Normal ROM. Neck supple. No JVD. No tracheal deviation. CVS: RRR, S1/S2 +, no murmurs, no gallops, no carotid bruit.  Pulmonary: Effort and breath sounds normal, no stridor, rhonchi, wheezes, rales.  Abdominal: Soft. BS +,  no distension, tenderness, rebound or  guarding.  Musculoskeletal: Normal range of motion. No edema and no tenderness.  Neuro: Alert. CN 2-12 grossly intact. No focal deficits. Skin: Skin is warm and dry. No rash noted. Psychiatric: Normal mood and affect.      LABORATORY PANEL:   CBC Recent Labs  Lab 05/10/18 0516  WBC 9.7  HGB 8.1*  HCT 26.3*  PLT 232   ------------------------------------------------------------------------------------------------------------------  Chemistries  Recent Labs  Lab 05/09/18 1715 05/10/18 0558  05/11/18 0443 05/11/18 0857  NA 136 139  --   --   --   K 3.3* 3.1*   < >  --  3.8  CL 98 102  --   --   --   CO2 25 25  --   --   --   GLUCOSE 179* 195*  --   --   --   BUN 142* 147*  --   --   --   CREATININE 4.15* 3.99*  --   --   --   CALCIUM 8.4* 7.6*  --   --   --   MG 1.2*  --    < > 2.1  --   AST 22  --   --   --   --   ALT 19  --   --   --   --   ALKPHOS 147*  --   --   --   --   BILITOT 0.6  --   --   --   --    < > = values in this interval not displayed.   ------------------------------------------------------------------------------------------------------------------  Cardiac Enzymes Recent Labs  Lab 05/09/18 1715  TROPONINI <0.03   ------------------------------------------------------------------------------------------------------------------  RADIOLOGY:  No results found.  ASSESSMENT AND PLAN:   62 year old female with history of chronic diastolic congestive heart failure, diabetes and hepatitis C who presented to the ER with enteritis and found to have acute on chronic kidney disease.   1.  New onset end-stage renal disease due to hypertension diabetes: Patient is now started on dialysis. Today is day 2 and patient will need 3 days prior to discharge and will need outpatient hemodialysis set up.  2.  Acute gastroenteritis: GI panel was noted for enteropathogenic E. coli.  Patient has completed ciprofloxacin 3-day course.  3  Chronic diastolic  heart failure without signs of exacerbation  4.  Diabetes: Continue Lantus with sliding scale and NovoLog 5 Hypertension: Continue Coreg and hydralazine, isosorbide 6  Hyperlipidemia: Continue statin  7.  CAD: Continue Brilinta, aspirin, Coreg, beta-blocker   Management plans discussed with the patient and she is in agreement.  CODE STATUS: Full  TOTAL TIME TAKING CARE OF THIS PATIENT: 23 minutes.     POSSIBLE D/C 2 to 3 days, DEPENDING ON CLINICAL CONDITION/outpatient dialysis set up   [name] M.D on 05/12/2018 at 10:35 AM  Between 7am to 6pm - Pager - [phone] After 6pm go to [email or web address] - password [email or web address] [facility]  Office  [phone]  CC: Primary care physician; [name], PA-C  Note: This dictation was prepared with Dragon dictation along with smaller phrase technology. Any transcriptional errors that result from this process are unintentional.

## 2018-05-12 NOTE — Progress Notes (Signed)
HD Treatment Initiated    05/12/18 1449  Vital Signs  Pulse Rate 78  Pulse Rate Source Monitor  Resp (!) 24  Oxygen Therapy  SpO2 100 %  During Hemodialysis Assessment  Blood Flow Rate (mL/min) 250 mL/min  Arterial Pressure (mmHg) -80 mmHg  Venous Pressure (mmHg) 60 mmHg  Transmembrane Pressure (mmHg) 70 mmHg  Ultrafiltration Rate (mL/min) 600 mL/min  Dialysate Flow Rate (mL/min) 500 ml/min  Conductivity: Machine  13.9  HD Safety Checks Performed Yes  Dialysis Fluid Bolus Normal Saline  Bolus Amount (mL) 250 mL  Intra-Hemodialysis Comments Tx initiated;Progressing as prescribed  Hemodialysis Catheter Right Subclavian Double-lumen  Placement Date/Time: (c) (c)   Placed prior to admission: (c) No  Orientation: Right  Access Location: Subclavian  Hemodialysis Catheter Type: Double-lumen  Blue Lumen Status Infusing  Red Lumen Status Infusing

## 2018-05-13 LAB — RENAL FUNCTION PANEL
Albumin: 3.1 g/dL — ABNORMAL LOW (ref 3.5–5.0)
Anion gap: 8 (ref 5–15)
BUN: 75 mg/dL — ABNORMAL HIGH (ref 6–20)
CO2: 27 mmol/L (ref 22–32)
Calcium: 7.5 mg/dL — ABNORMAL LOW (ref 8.9–10.3)
Chloride: 100 mmol/L (ref 98–111)
Creatinine, Ser: 2.28 mg/dL — ABNORMAL HIGH (ref 0.44–1.00)
GFR calc Af Amer: 26 mL/min — ABNORMAL LOW (ref >60)
GFR calc non Af Amer: 22 mL/min — ABNORMAL LOW (ref >60)
Glucose, Bld: 163 mg/dL — ABNORMAL HIGH (ref 70–99)
Phosphorus: 3 mg/dL (ref 2.5–4.6)
Potassium: 3.6 mmol/L (ref 3.5–5.1)
Sodium: 135 mmol/L (ref 135–145)

## 2018-05-13 LAB — CBC
HCT: 26.3 % — ABNORMAL LOW (ref 36.0–46.0)
Hemoglobin: 8.1 g/dL — ABNORMAL LOW (ref 12.0–15.0)
MCH: 27.1 pg (ref 26.0–34.0)
MCHC: 30.8 g/dL (ref 30.0–36.0)
MCV: 88 fL (ref 80.0–100.0)
Platelets: 201 10*3/uL (ref 150–400)
RBC: 2.99 MIL/uL — ABNORMAL LOW (ref 3.87–5.11)
RDW: 16.3 % — ABNORMAL HIGH (ref 11.5–15.5)
WBC: 9.8 10*3/uL (ref 4.0–10.5)
nRBC: 0 % (ref 0.0–0.2)

## 2018-05-13 LAB — GLUCOSE, CAPILLARY
Glucose-Capillary: 131 mg/dL — ABNORMAL HIGH (ref 70–99)
Glucose-Capillary: 171 mg/dL — ABNORMAL HIGH (ref 70–99)
Glucose-Capillary: 186 mg/dL — ABNORMAL HIGH (ref 70–99)

## 2018-05-13 NOTE — Progress Notes (Signed)
Patient refused walking this evening and stated that she has been walking and just doesn't feel like getting up to do so during scheduled time to walk.

## 2018-05-13 NOTE — Care Management (Signed)
Per Elvera Bicker HD liaison outpatient HD has been confirmed.  Patient to dialyze in hospital tomorrow.  Then will start outpatient HD on Tuesday.  Outpatient schedule TTS at 10 am at [facility].

## 2018-05-13 NOTE — Progress Notes (Signed)
[facility]  Daily Progress Note   Subjective: 3 Days Post-Op: Permcath Insertion with Dr. Verdell Face working well.  Patient to follow up as outpatient for vein mapping if permanent dialysis is needed. Vascular surgery to sign off at this time.   Marcelle Overlie PA-C 05/13/2018 11:13 AM

## 2018-05-13 NOTE — Progress Notes (Signed)
[facility]  ROUNDING NOTE   Subjective:   Seen and examined on hemodialysis. Tolerating treatment well. UF of 1 liter.     HEMODIALYSIS FLOWSHEET:  Blood Flow Rate (mL/min): 300 mL/min Arterial Pressure (mmHg): -120 mmHg Venous Pressure (mmHg): 90 mmHg Transmembrane Pressure (mmHg): 60 mmHg Ultrafiltration Rate (mL/min): 660 mL/min Dialysate Flow Rate (mL/min): 600 ml/min Conductivity: Machine : 13.5 Conductivity: Machine : 13.5 Dialysis Fluid Bolus: Normal Saline Bolus Amount (mL): 250 mL     Objective:  Vital signs in last 24 hours:  Temp:  [97.9 F (36.6 C)-98.3 F (36.8 C)] 97.9 F (36.6 C) (11/01 1400) Pulse Rate:  [69-82] 72 (11/01 1414) Resp:  [8-24] 9 (11/01 1414) BP: (113-168)/(54-77) 163/69 (11/01 1535) SpO2:  [95 %-100 %] 99 % (11/01 1100) Weight:  [132 kg-133.7 kg] 132 kg (11/01 1400)  Weight change: 1.4 kg Filed Weights   05/13/18 0500 05/13/18 1100 05/13/18 1400  Weight: 133.6 kg 133.7 kg 132 kg    Intake/Output: I/O last 3 completed shifts: In: 480 [P.O.:480] Out: 1014 [Other:1014]   Intake/Output this shift:  Total I/O In: 1840 [P.O.:1840] Out: 1500 [Other:1500]  Physical Exam: General: NAD,   Head: Normocephalic, atraumatic. Moist oral mucosal membranes  Eyes: Anicteric, PERRL  Neck: Supple, trachea midline  Lungs:  Bilateral crackles  Heart: Regular rate and rhythm  Abdomen:  +soft  Extremities:  trace peripheral edema.  Neurologic: Nonfocal, moving all four extremities  Skin: No lesions  Access: RIJ permcath 10/29 Dr. Delana Meyer    Basic Metabolic Panel: Recent Labs  Lab 05/09/18 1715 05/10/18 9937 05/10/18 1696 05/10/18 1417 05/10/18 1945 05/11/18 0443 05/11/18 0857 05/13/18 1116  NA 136 139  --   --   --   --   --  135  K 3.3* 3.1*  --   --  4.1  --  3.8 3.6  CL 98 102  --   --   --   --   --  100  CO2 25 25  --   --   --   --   --  27  GLUCOSE 179* 195*  --   --   --   --   --  163*  BUN 142* 147*  --    --   --   --   --  75*  CREATININE 4.15* 3.99*  --   --   --   --   --  2.28*  CALCIUM 8.4* 7.6*  --   --   --   --   --  7.5*  MG 1.2*  --  1.3*  --   --  2.1  --   --   PHOS  --   --   --  5.5*  --   --   --  3.0    Liver Function Tests: Recent Labs  Lab 05/09/18 1715 05/13/18 1116  AST 22  --   ALT 19  --   ALKPHOS 147*  --   BILITOT 0.6  --   PROT 7.8  --   ALBUMIN 3.5 3.1*   Recent Labs  Lab 05/09/18 1715  LIPASE 52*   No results for input(s): AMMONIA in the last 168 hours.  CBC: Recent Labs  Lab 05/09/18 1715 05/10/18 0516 05/13/18 1116  WBC 11.0* 9.7 9.8  HGB 8.6* 8.1* 8.1*  HCT 27.4* 26.3* 26.3*  MCV 87.8 88.3 88.0  PLT 248 232 201    Cardiac Enzymes: Recent Labs  Lab  05/09/18 1715  TROPONINI <0.03    BNP: Invalid input(s): POCBNP  CBG: Recent Labs  Lab 05/12/18 1159 05/12/18 1750 05/12/18 2116 05/13/18 0724 05/13/18 1633  GLUCAP 183* 100* 206* 131* 171*    Microbiology: Results for orders placed or performed during the hospital encounter of 05/09/18  C difficile quick scan w PCR reflex     Status: Abnormal   Collection Time: 05/09/18  9:46 PM  Result Value Ref Range Status   C Diff antigen POSITIVE (A) NEGATIVE Final   C Diff toxin NEGATIVE NEGATIVE Final   C Diff interpretation Results are indeterminate. See PCR results.  Final    Comment: Performed at [facility], [address] [place], [place] [address]  C. Diff by PCR, Reflexed     Status: None   Collection Time: 05/09/18  9:46 PM  Result Value Ref Range Status   Toxigenic C. Difficile by PCR NEGATIVE NEGATIVE Final    Comment: Patient is colonized with non toxigenic C. difficile. May not need treatment unless significant symptoms are present. Performed at [facility], [address] [place], [place] [address]   Gastrointestinal Panel by PCR , Stool     Status: Abnormal   Collection Time: 05/10/18  8:34 AM  Result Value Ref Range Status    Campylobacter species NOT DETECTED NOT DETECTED Final   Plesimonas shigelloides NOT DETECTED NOT DETECTED Final   Salmonella species NOT DETECTED NOT DETECTED Final   Yersinia enterocolitica NOT DETECTED NOT DETECTED Final   Vibrio species NOT DETECTED NOT DETECTED Final   Vibrio cholerae NOT DETECTED NOT DETECTED Final   Enteroaggregative E coli (EAEC) NOT DETECTED NOT DETECTED Final   Enteropathogenic E coli (EPEC) DETECTED (A) NOT DETECTED Final    Comment: CRITICAL RESULT CALLED TO, READ BACK BY AND VERIFIED WITH: JOSH SIMSER AT 1504 ON 05/10/18,KP    Enterotoxigenic E coli (ETEC) NOT DETECTED NOT DETECTED Final   Shiga like toxin producing E coli (STEC) NOT DETECTED NOT DETECTED Final   Shigella/Enteroinvasive E coli (EIEC) NOT DETECTED NOT DETECTED Final   Cryptosporidium NOT DETECTED NOT DETECTED Final   Cyclospora cayetanensis NOT DETECTED NOT DETECTED Final   Entamoeba histolytica NOT DETECTED NOT DETECTED Final   Giardia lamblia NOT DETECTED NOT DETECTED Final   Adenovirus F40/41 NOT DETECTED NOT DETECTED Final   Astrovirus NOT DETECTED NOT DETECTED Final   Norovirus GI/GII NOT DETECTED NOT DETECTED Final   Rotavirus A NOT DETECTED NOT DETECTED Final   Sapovirus (I, II, IV, and V) NOT DETECTED NOT DETECTED Final    Comment: Performed at [facility], [address] [place], [place] [address]    Coagulation Studies: No results for input(s): LABPROT, INR in the last 72 hours.  Urinalysis: No results for input(s): COLORURINE, LABSPEC, PHURINE, GLUCOSEU, HGBUR, BILIRUBINUR, KETONESUR, PROTEINUR, UROBILINOGEN, NITRITE, LEUKOCYTESUR in the last 72 hours.  Invalid input(s): APPERANCEUR    Imaging: No results found.   Medications:    . allopurinol  100 mg Oral Daily  . aspirin EC  81 mg Oral Daily  . atorvastatin  80 mg Oral Daily  . carvedilol  25 mg Oral BID WC  . Chlorhexidine Gluconate Cloth  6 each Topical Q0600  . diphenhydrAMINE  50 mg Intravenous  Once  . ferrous sulfate  325 mg Oral Q breakfast  . heparin  5,000 Units Subcutaneous Q8H  . hydrALAZINE  100 mg Oral TID  . hydrocortisone cream   Topical BID  . insulin aspart  0-20 Units Subcutaneous TID WC  . insulin aspart  0-5 Units Subcutaneous QHS  . insulin aspart  25 Units Subcutaneous TID WC  . insulin glargine  60 Units Subcutaneous BID  . isosorbide mononitrate  90 mg Oral Daily  . lactobacillus  1 g Oral TID WC  . loratadine  10 mg Oral Daily  . methylPREDNISolone (SOLU-MEDROL) injection  125 mg Intravenous Once  . mometasone-formoterol  2 puff Inhalation BID  . montelukast  10 mg Oral QHS  . pantoprazole  40 mg Oral Daily  . pregabalin  75 mg Oral BID  . ticagrelor  90 mg Oral BID  . valACYclovir  500 mg Oral QODAY   acetaminophen **OR** acetaminophen, albuterol, bisacodyl, fluticasone, hydrALAZINE, HYDROcodone-acetaminophen, hydrOXYzine, nitroGLYCERIN, ondansetron **OR** ondansetron (ZOFRAN) IV, traZODone  Assessment/ Plan:  [name] is a 62 y.o. black female with hypertension, coronary artery disease, congestive heart failure with diastolic dysfunction, diabetes mellitus type II, chronic hepatitis C, peptic ulcer disease, obstructive sleep apnea who presents for follow up  1. End Stage Renal Disease secondary to hypertension and diabetes. First hemodialysis treatment on 10/30 - Third treatment today. Fourth treatment for tomorrow and then may be discharged - Outpatient planning for TTS schedule at [facility]  2. Hypertension: elevated - start losartan 100mg  daily  3. Diabetes mellitus type II with chronic kidney disease: Insulin dependent. Not well controlled. Hemoglobin A1c 11.1% on 04/11/18  4. Gout: no recent acute flares - Continue allopurinol   5. Anemia with chronic kidney disease:   - EPO with HD treatments TTS    6. Secondary Hyperparathyroidism: PTH 341 on 04/11/18. Hypocalcemia today. Normal range phosphorus - no indication for  binders at this time.    LOS: 4 Ronaldo Crilly 11/1/20194:46 PM

## 2018-05-13 NOTE — Progress Notes (Signed)
[facility] [place] NAME: [name]    MR#:  [id number]  DATE OF BIRTH:  May 18, 1956  SUBJECTIVE:  patient doing well. Today will be Thursday for dialysis. Complains of some pain at the perm cath site  REVIEW OF SYSTEMS:   Review of Systems  Constitutional: Negative for chills, fever and weight loss.  HENT: Negative for ear discharge, ear pain and nosebleeds.   Eyes: Negative for blurred vision, pain and discharge.  Respiratory: Negative for sputum production, shortness of breath, wheezing and stridor.   Cardiovascular: Negative for chest pain, palpitations, orthopnea and PND.  Gastrointestinal: Negative for abdominal pain, diarrhea, nausea and vomiting.  Genitourinary: Negative for frequency and urgency.  Musculoskeletal: Negative for back pain and joint pain.  Neurological: Negative for sensory change, speech change, focal weakness and weakness.  Psychiatric/Behavioral: Negative for depression and hallucinations. The patient is not nervous/anxious.    Tolerating Diet:yesTolerating PT: walks by herself  DRUG ALLERGIES:   Allergies  Allergen Reactions  . Shellfish Allergy Anaphylaxis and Swelling  . Diazepam Other (See Comments)    "felt like out of body experience"  . Morphine And Related Itching    VITALS:  Blood pressure (!) 113/54, pulse 77, temperature 98.3 F (36.8 C), temperature source Oral, resp. rate 17, height 5\' 8"  (1.727 m), weight 133.6 kg, SpO2 98 %.  PHYSICAL EXAMINATION:   Physical Exam  GENERAL:  62 y.o.-year-old patient lying in the bed with no acute distress. Obese EYES: Pupils equal, round, reactive to light and accommodation. No scleral icterus. Extraocular muscles intact.  HEENT: Head atraumatic, normocephalic. Oropharynx and nasopharynx clear.  NECK:  Supple, no jugular venous distention. No thyroid enlargement, no tenderness.  LUNGS: Normal breath sounds bilaterally, no wheezing, rales,  rhonchi. No use of accessory muscles of respiration. HD perm cath + CARDIOVASCULAR: S1, S2 normal. No murmurs, rubs, or gallops.  ABDOMEN: Soft, nontender, nondistended. Bowel sounds present. No organomegaly or mass.  EXTREMITIES: No cyanosis, clubbing or edema b/l.    NEUROLOGIC: Cranial nerves II through XII are intact. No focal Motor or sensory deficits b/l.   PSYCHIATRIC:  patient is alert and oriented x 3.  SKIN: No obvious rash, lesion, or ulcer.   LABORATORY PANEL:  CBC Recent Labs  Lab 05/10/18 0516  WBC 9.7  HGB 8.1*  HCT 26.3*  PLT 232    Chemistries  Recent Labs  Lab 05/09/18 1715 05/10/18 0558  05/11/18 0443 05/11/18 0857  NA 136 139  --   --   --   K 3.3* 3.1*   < >  --  3.8  CL 98 102  --   --   --   CO2 25 25  --   --   --   GLUCOSE 179* 195*  --   --   --   BUN 142* 147*  --   --   --   CREATININE 4.15* 3.99*  --   --   --   CALCIUM 8.4* 7.6*  --   --   --   MG 1.2*  --    < > 2.1  --   AST 22  --   --   --   --   ALT 19  --   --   --   --   ALKPHOS 147*  --   --   --   --   BILITOT 0.6  --   --   --   --    < > =  values in this interval not displayed.   Cardiac Enzymes Recent Labs  Lab 05/09/18 1715  TROPONINI <0.03   RADIOLOGY:  No results found. ASSESSMENT AND PLAN:  [name] is a 62 y.o. black female with hypertension, coronary artery disease, congestive heart failure with diastolic dysfunction, diabetes mellitus type II, chronic hepatitis C, peptic ulcer disease, obstructive sleep apnea who presents for follow up  1. End Stage Renal Disease secondary to hypertension and diabetes. First hemodialysis treatment on 10/30 - third treatment for later today.   2. Hypertension: elevated - hold diuretics -cont home meds  3. Diabetes mellitus type II with chronic kidney disease: Insulin dependent.  -Not well controlled. - Hemoglobin A1c 11.1% on 04/11/18  4. Gout: no recent acute flares - Continue allopurinol   5. Anemia with  chronic kidney disease: hemoglobin 8.1 normocytic - EPO with HD treatments TTS    6. Secondary Hyperparathyroidism: PTH 341 on 04/11/18. Hypocalcemia today.  - calcium acetate discussed, will start after gastroenteritis resolves.   Care management working on getting dialysis chair time as outpatient  Case discussed with Care Management/Social Worker. Management plans discussed with the patient, family and they are in agreement.  CODE STATUS: full  DVT Prophylaxis: heparin  TOTAL TIME TAKING CARE OF THIS PATIENT: *30* minutes.  >50% time spent on counselling and coordination of care  POSSIBLE D/C IN 1-2 DAYS, DEPENDING ON CLINICAL CONDITION.  Note: This dictation was prepared with Dragon dictation along with smaller phrase technology. Any transcriptional errors that result from this process are unintentional.  [name] M.D on 05/13/2018 at 8:09 AM  Between 7am to 6pm - Pager - [phone]  After 6pm go to [email or web address] - password [email or web address] [facility]  Office  [phone]  CC: Primary care physician; [name], PA-CPatient ID: [name], female   DOB: Aug 29, 1955, 62 y.o.   MRN: [id number]

## 2018-05-13 NOTE — Progress Notes (Signed)
This note also relates to the following rows which could not be included: Pulse Rate - Cannot attach notes to unvalidated device data Resp - Cannot attach notes to unvalidated device data BP - Cannot attach notes to unvalidated device data  Hd started  

## 2018-05-13 NOTE — Care Management (Signed)
Patient for 3 HD session today. Awaiting confirmation of outpatient HD.  Per nursing patient is independent and shouldn't have needs at discharge.

## 2018-05-13 NOTE — Progress Notes (Signed)
This note also relates to the following rows which could not be included: Pulse Rate - Cannot attach notes to unvalidated device data Resp - Cannot attach notes to unvalidated device data BP - Cannot attach notes to unvalidated device data  Hd completed  

## 2018-05-14 LAB — GLUCOSE, CAPILLARY
Glucose-Capillary: 259 mg/dL — ABNORMAL HIGH (ref 70–99)
Glucose-Capillary: 254 mg/dL — ABNORMAL HIGH (ref 70–99)

## 2018-05-14 MED ORDER — EPOETIN ALFA 10000 UNIT/ML IJ SOLN
10000.0000 [IU] | INTRAMUSCULAR | Status: DC
  Administered 2018-05-14: 10000 [IU] via INTRAVENOUS

## 2018-05-14 MED ORDER — ALLOPURINOL 100 MG PO TABS: 100.0000 mg | ORAL_TABLET | Freq: Every day | ORAL | 1 refills | Status: DC

## 2018-05-14 MED ORDER — HYDROCODONE-ACETAMINOPHEN 5-325 MG PO TABS: 1.0000 | ORAL_TABLET | Freq: Three times a day (TID) | ORAL | 0 refills | Status: DC | PRN

## 2018-05-14 NOTE — Progress Notes (Signed)
Discharge instructions reviewed with patient including followup visits and new medications.  Understanding was verbalized and all questions were answered.  IV removed without complication; patient tolerated well.  Patient discharged home via wheelchair in stable condition escorted by nursing staff.  

## 2018-05-14 NOTE — Discharge Instructions (Signed)
Your hemodialysis has been scheduled and [name] 10 AM on Tuesday Thursday and Saturday

## 2018-05-14 NOTE — Progress Notes (Signed)
[facility]  ROUNDING NOTE   Subjective:   Seen and examined on hemodialysis. Tolerating treatment well. UF of 1.5 liter. Seated in chair    HEMODIALYSIS FLOWSHEET:  Blood Flow Rate (mL/min): 400 mL/min Arterial Pressure (mmHg): -150 mmHg Venous Pressure (mmHg): 130 mmHg Transmembrane Pressure (mmHg): 60 mmHg Ultrafiltration Rate (mL/min): 450 mL/min Dialysate Flow Rate (mL/min): 600 ml/min Conductivity: Machine : 14 Conductivity: Machine : 14 Dialysis Fluid Bolus: Normal Saline Bolus Amount (mL): 250 mL     Objective:  Vital signs in last 24 hours:  Temp:  [97.9 F (36.6 C)-98.7 F (37.1 C)] 98.7 F (37.1 C) (11/02 0909) Pulse Rate:  [69-85] 73 (11/02 1130) Resp:  [9-20] 18 (11/02 1130) BP: (125-166)/(51-139) 158/62 (11/02 1130) SpO2:  [96 %-100 %] 100 % (11/02 1130) Weight:  [132 kg] 132 kg (11/01 1400)  Weight change: -0.8 kg Filed Weights   05/13/18 0500 05/13/18 1100 05/13/18 1400  Weight: 133.6 kg 133.7 kg 132 kg    Intake/Output: I/O last 3 completed shifts: In: 1840 [P.O.:1840] Out: 1500 [Other:1500]   Intake/Output this shift:  No intake/output data recorded.  Physical Exam: General: NAD, seated in chair  Head: Normocephalic, atraumatic. Moist oral mucosal membranes  Eyes: Anicteric, PERRL  Neck: Supple, trachea midline  Lungs:  Bilateral crackles  Heart: Regular rate and rhythm  Abdomen:  +soft  Extremities:  no peripheral edema.  Neurologic: Nonfocal, moving all four extremities  Skin: No lesions  Access: RIJ permcath 10/29 Dr. Delana Meyer    Basic Metabolic Panel: Recent Labs  Lab 05/09/18 1715 05/10/18 5093 05/10/18 2671 05/10/18 1417 05/10/18 1945 05/11/18 0443 05/11/18 0857 05/13/18 1116  NA 136 139  --   --   --   --   --  135  K 3.3* 3.1*  --   --  4.1  --  3.8 3.6  CL 98 102  --   --   --   --   --  100  CO2 25 25  --   --   --   --   --  27  GLUCOSE 179* 195*  --   --   --   --   --  163*  BUN 142* 147*  --    --   --   --   --  75*  CREATININE 4.15* 3.99*  --   --   --   --   --  2.28*  CALCIUM 8.4* 7.6*  --   --   --   --   --  7.5*  MG 1.2*  --  1.3*  --   --  2.1  --   --   PHOS  --   --   --  5.5*  --   --   --  3.0    Liver Function Tests: Recent Labs  Lab 05/09/18 1715 05/13/18 1116  AST 22  --   ALT 19  --   ALKPHOS 147*  --   BILITOT 0.6  --   PROT 7.8  --   ALBUMIN 3.5 3.1*   Recent Labs  Lab 05/09/18 1715  LIPASE 52*   No results for input(s): AMMONIA in the last 168 hours.  CBC: Recent Labs  Lab 05/09/18 1715 05/10/18 0516 05/13/18 1116  WBC 11.0* 9.7 9.8  HGB 8.6* 8.1* 8.1*  HCT 27.4* 26.3* 26.3*  MCV 87.8 88.3 88.0  PLT 248 232 201    Cardiac Enzymes: Recent Labs  Lab 05/09/18  [place] <0.03    BNP: Invalid input(s): POCBNP  CBG: Recent Labs  Lab 05/12/18 2116 05/13/18 0724 05/13/18 1633 05/13/18 2138 05/14/18 0737  GLUCAP 206* 131* 171* 186* 8*    Microbiology: Results for orders placed or performed during the hospital encounter of 05/09/18  C difficile quick scan w PCR reflex     Status: Abnormal   Collection Time: 05/09/18  9:46 PM  Result Value Ref Range Status   C Diff antigen POSITIVE (A) NEGATIVE Final   C Diff toxin NEGATIVE NEGATIVE Final   C Diff interpretation Results are indeterminate. See PCR results.  Final    Comment: Performed at [facility], [address] [place], [place] [address]  C. Diff by PCR, Reflexed     Status: None   Collection Time: 05/09/18  9:46 PM  Result Value Ref Range Status   Toxigenic C. Difficile by PCR NEGATIVE NEGATIVE Final    Comment: Patient is colonized with non toxigenic C. difficile. May not need treatment unless significant symptoms are present. Performed at [facility], [address] [place], [place] [address]   Gastrointestinal Panel by PCR , Stool     Status: Abnormal   Collection Time: 05/10/18  8:34 AM  Result Value Ref Range Status    Campylobacter species NOT DETECTED NOT DETECTED Final   Plesimonas shigelloides NOT DETECTED NOT DETECTED Final   Salmonella species NOT DETECTED NOT DETECTED Final   Yersinia enterocolitica NOT DETECTED NOT DETECTED Final   Vibrio species NOT DETECTED NOT DETECTED Final   Vibrio cholerae NOT DETECTED NOT DETECTED Final   Enteroaggregative E coli (EAEC) NOT DETECTED NOT DETECTED Final   Enteropathogenic E coli (EPEC) DETECTED (A) NOT DETECTED Final    Comment: CRITICAL RESULT CALLED TO, READ BACK BY AND VERIFIED WITH: JOSH SIMSER AT 1504 ON 05/10/18,KP    Enterotoxigenic E coli (ETEC) NOT DETECTED NOT DETECTED Final   Shiga like toxin producing E coli (STEC) NOT DETECTED NOT DETECTED Final   Shigella/Enteroinvasive E coli (EIEC) NOT DETECTED NOT DETECTED Final   Cryptosporidium NOT DETECTED NOT DETECTED Final   Cyclospora cayetanensis NOT DETECTED NOT DETECTED Final   Entamoeba histolytica NOT DETECTED NOT DETECTED Final   Giardia lamblia NOT DETECTED NOT DETECTED Final   Adenovirus F40/41 NOT DETECTED NOT DETECTED Final   Astrovirus NOT DETECTED NOT DETECTED Final   Norovirus GI/GII NOT DETECTED NOT DETECTED Final   Rotavirus A NOT DETECTED NOT DETECTED Final   Sapovirus (I, II, IV, and V) NOT DETECTED NOT DETECTED Final    Comment: Performed at [facility], [address] [place], [place] [address]    Coagulation Studies: No results for input(s): LABPROT, INR in the last 72 hours.  Urinalysis: No results for input(s): COLORURINE, LABSPEC, PHURINE, GLUCOSEU, HGBUR, BILIRUBINUR, KETONESUR, PROTEINUR, UROBILINOGEN, NITRITE, LEUKOCYTESUR in the last 72 hours.  Invalid input(s): APPERANCEUR    Imaging: No results found.   Medications:    . allopurinol  100 mg Oral Daily  . aspirin EC  81 mg Oral Daily  . atorvastatin  80 mg Oral Daily  . carvedilol  25 mg Oral BID WC  . Chlorhexidine Gluconate Cloth  6 each Topical Q0600  . diphenhydrAMINE  50 mg Intravenous  Once  . ferrous sulfate  325 mg Oral Q breakfast  . heparin  5,000 Units Subcutaneous Q8H  . hydrALAZINE  100 mg Oral TID  . hydrocortisone cream   Topical BID  . insulin aspart  0-20 Units Subcutaneous TID WC  . insulin aspart  0-5 Units Subcutaneous QHS  . insulin aspart  25 Units Subcutaneous TID WC  . insulin glargine  60 Units Subcutaneous BID  . isosorbide mononitrate  90 mg Oral Daily  . lactobacillus  1 g Oral TID WC  . loratadine  10 mg Oral Daily  . methylPREDNISolone (SOLU-MEDROL) injection  125 mg Intravenous Once  . mometasone-formoterol  2 puff Inhalation BID  . montelukast  10 mg Oral QHS  . pantoprazole  40 mg Oral Daily  . pregabalin  75 mg Oral BID  . ticagrelor  90 mg Oral BID  . valACYclovir  500 mg Oral QODAY   acetaminophen **OR** acetaminophen, albuterol, bisacodyl, fluticasone, hydrALAZINE, HYDROcodone-acetaminophen, hydrOXYzine, nitroGLYCERIN, ondansetron **OR** ondansetron (ZOFRAN) IV, traZODone  Assessment/ Plan:  Ms. ARACELI ARANGO is a 62 y.o. black female with hypertension, coronary artery disease, congestive heart failure with diastolic dysfunction, diabetes mellitus type II, chronic hepatitis C, peptic ulcer disease, obstructive sleep apnea who presents for follow up  1. End Stage Renal Disease secondary to hypertension and diabetes. First hemodialysis treatment on 10/30 - Seen and examined on hemodialysis. Tolerating treatment well.  - Outpatient planning for TTS schedule at [facility]  2. Hypertension: 158/62 - losartan 100mg  daily started on this admission - carvedilol, hydralazine, and imdur  3. Diabetes mellitus type II with chronic kidney disease: Insulin dependent. Not well controlled. Hemoglobin A1c 11.1% on 04/11/18  4. Gout: no recent acute flares - Continue allopurinol   5. Anemia with chronic kidney disease:   - EPO with HD treatments TTS    6. Secondary Hyperparathyroidism: PTH 341 on 04/11/18. Hypocalcemia today. Normal range  phosphorus - no indication for binders at this time.    LOS: 5 [name] 11/2/201911:41 AM

## 2018-05-14 NOTE — Discharge Summary (Addendum)
[facility] NAME: [name]    MR#:  [id number]  DATE OF BIRTH:  [birth year]  DATE OF ADMISSION:  05/09/2018 ADMITTING PHYSICIAN: Heather Jo, MD  DATE OF DISCHARGE: 05/14/2018  PRIMARY CARE PHYSICIAN: Heather Daring, PA-C    ADMISSION DIAGNOSIS:  Diarrhea of presumed infectious origin [R19.7] Generalized abdominal pain [R10.84] Elevated BUN [R79.9]  DISCHARGE DIAGNOSIS:  end-stage renal disease now started on hemodialysis  SECONDARY DIAGNOSIS:   Past Medical History:  Diagnosis Date  . Anemia   . Aortic stenosis    Echo 8/18: mean 13, peak 28, LVOT/AV mean velocity 0.51  . Arthritis   . Asthma    As a child   . Bronchitis   . CAD (coronary artery disease)    a. 09/2016: 50% Ost 1st Mrg stenosis, 50% 2nd Mrg stenosis, 20% Mid-Cx, 95% Prox LAD, 40% mid-LAD, and 10% dist-LAD stenosis. Staged PCI with DES to Prox-LAD.   [name] Chronic combined systolic and diastolic CHF (congestive heart failure) ([place]) 2011   echo 2/18: EF 55-60, normal wall motion, grade 2 diastolic dysfunction, trivial AI // echo 3/18: Septal and apical HK, EF 45-50, normal wall motion, trivial AI, mild LAE, PASP 38 // echo 8/18: EF 60-65, normal wall motion, grade 1 diastolic dysfunction, calcified aortic valve leaflets, mild aortic stenosis (mean 13, peak 28, LVOT/AV mean velocity 0.51), mild AI, moderate MAC, mild LAE, trivial TR   . Chronic kidney disease    STAGE 4  . Depression   . Diabetes mellitus Dx 1989  . GERD (gastroesophageal reflux disease)   . Gout   . Hepatitis C Dx 2013  . Hypertension Dx 1989  . Myocardial infarction ([place]) 07/2015  . Obesity   . Obstructive sleep apnea   . Pancreatitis 2013  . Pneumonia   . Refusal of blood transfusions as patient is Jehovah's Witness   . Tendinitis   . Tremors of nervous system    LEFT HAND  . Ulcer 2010    HOSPITAL COURSE:  Ms.[name] a 62 y.o.black femalewith  hypertension, coronary artery disease, congestive heart failure with diastolic dysfunction, diabetes mellitus type II, chronic hepatitis C, peptic ulcer disease, obstructive sleep apnea who presents for follow up  1. End Stage Renal Disease secondary to hypertension and diabetes. First hemodialysis treatment on 10/30 -tolerating hemodialysis well. Patient has had good amount of ultrafiltration. -Patient dialysis has been scheduled for Tuesday Thursdays and Saturdays at [facility]  2. Hypertension: -resumed torsemide and cont Bp meds -cont home meds  3. Diabetes mellitus type II with chronic kidney disease: Insulin dependent.  -Not well controlled. - Hemoglobin A1c 11.1% on 04/11/18  4. Gout: no recent acute flares - Continue allopurinol   5. Anemia with chronic kidney disease: hemoglobin 8.1 normocytic - EPO with HD treatments TTS   6. Secondary Hyperparathyroidism: PTH 341 on 04/11/18. Hypocalcemia today.  - calcium acetate discussed, will start after gastroenteritis resolves.   pt will discharged to home after dialysis today.  CONSULTS OBTAINED:  Treatment Team:  Lavonia Dana, MD  DRUG ALLERGIES:   Allergies  Allergen Reactions  . Shellfish Allergy Anaphylaxis and Swelling  . Diazepam Other (See Comments)    "felt like out of body experience"  . Morphine And Related Itching    DISCHARGE MEDICATIONS:   Allergies as of 05/14/2018      Reactions   Shellfish Allergy Anaphylaxis, Swelling   Diazepam Other (See Comments)   "felt  like out of body experience"   Morphine And Related Itching      Medication List    STOP taking these medications   metolazone 2.5 MG tablet Commonly known as:  ZAROXOLYN     TAKE these medications   accu-chek softclix lancets Use as instructed daily.   allopurinol 100 MG tablet Commonly known as:  ZYLOPRIM Take 1 tablet (100 mg total) by mouth daily. What changed:  See the new instructions.   aspirin 81 MG EC tablet Take  1 tablet (81 mg total) by mouth daily.   atorvastatin 80 MG tablet Commonly known as:  LIPITOR Take 1 tablet (80 mg total) by mouth daily at 6 PM. What changed:  when to take this   carvedilol 25 MG tablet Commonly known as:  COREG TAKE 1 TABLET BY MOUTH 2 TIMES DAILY WITH A MEAL. What changed:    how much to take  how to take this  when to take this  additional instructions   docusate sodium 100 MG capsule Commonly known as:  COLACE Take 1 capsule (100 mg total) by mouth 2 (two) times daily as needed for mild constipation.   FEROSUL 325 (65 FE) MG tablet Generic drug:  ferrous sulfate TAKE (1) TABLET BY MOUTH EVERY OTHER DAY.   fluticasone 50 MCG/ACT nasal spray Commonly known as:  FLONASE Place 2 sprays into both nostrils daily as needed for allergies or rhinitis.   Fluticasone-Salmeterol 250-50 MCG/DOSE Aepb Commonly known as:  ADVAIR Inhale 1 puff into the lungs 2 (two) times daily.   glucose blood test strip To check blood sugar before meals and bedtime   hydrALAZINE 100 MG tablet Commonly known as:  APRESOLINE Take 1 tablet (100 mg total) by mouth 3 (three) times daily.   HYDROcodone-acetaminophen 5-325 MG tablet Commonly known as:  NORCO/VICODIN Take 1 tablet by mouth every 8 (eight) hours as needed for moderate pain.   hydrOXYzine 50 MG tablet Commonly known as:  ATARAX/VISTARIL Take 1 tablet (50 mg total) by mouth 2 (two) times daily as needed.   insulin aspart 100 UNIT/ML FlexPen Commonly known as:  NOVOLOG Inject 25 Units into the skin 3 (three) times daily with meals.   isosorbide mononitrate 30 MG 24 hr tablet Commonly known as:  IMDUR TAKE (3) TABLETS BY MOUTH ONCE DAILY.   LANTUS SOLOSTAR 100 UNIT/ML Solostar Pen Generic drug:  Insulin Glargine INJECT 60 UNITS S.Q. TWICE DAILY. What changed:  See the new instructions.   liraglutide 18 MG/3ML Sopn Commonly known as:  [employer] with 0.47m daily and increase by 0.689mper week until max  dose of 1.8 mg dose achieved.   loratadine 10 MG tablet Commonly known as:  CLARITIN TAKE 1 TABLET BY MOUTH ONCE DAILY.   montelukast 10 MG tablet Commonly known as:  SINGULAIR TAKE ONE TABLET BY MOUTH AT BEDTIME.   nitroGLYCERIN 0.4 MG SL tablet Commonly known as:  NITROSTAT Place 1 tablet (0.4 mg total) under the tongue every 5 (five) minutes x 3 doses as needed for chest pain.   omeprazole 20 MG capsule Commonly known as:  PRILOSEC Take 1 capsule (20 mg total) by mouth daily.   Pen Needles 30G X 8 MM Misc 1 each by Does not apply route 3 (three) times daily before meals. AC for novolog injections   potassium chloride SA 20 MEQ tablet Commonly known as:  K-DUR,KLOR-CON TAKE 1 TABLET BY MOUTH TWICE DAILY   pregabalin 75 MG capsule Commonly known as:  LYRICA  Take 1 capsule (75 mg total) by mouth 2 (two) times daily.   PROAIR HFA 108 (90 Base) MCG/ACT inhaler Generic drug:  albuterol INHALE 2 PUFFS BY MOUTH EVERY 4 HOURS AS NEEDED FOR SHORTNESS OF BREATH   SENNA PLUS 8.6-50 MG tablet Generic drug:  senna-docusate Take 1 tablet by mouth daily as needed.   ticagrelor 90 MG Tabs tablet Commonly known as:  BRILINTA Take 1 tablet (90 mg total) by mouth 2 (two) times daily.   torsemide 20 MG tablet Commonly known as:  DEMADEX TAKE (2) TABLETS BY MOUTH TWICE DAILY.   triamcinolone cream 0.1 % Commonly known as:  KENALOG APPLY TO AFFECTED AREA TWICE DAILY   valACYclovir 500 MG tablet Commonly known as:  VALTREX TAKE (1) TABLET BY MOUTH EVERY OTHER DAY. What changed:  See the new instructions.   Vitamin D (Ergocalciferol) 50000 units Caps capsule Commonly known as:  DRISDOL TAKE 1 CAPSULE BY MOUTH ONCE A MONTH       If you experience worsening of your admission symptoms, develop shortness of breath, life threatening emergency, suicidal or homicidal thoughts you must seek medical attention immediately by calling 911 or calling your MD immediately  if symptoms less  severe.  You Must read complete instructions/literature along with all the possible adverse reactions/side effects for all the Medicines you take and that have been prescribed to you. Take any new Medicines after you have completely understood and accept all the possible adverse reactions/side effects.   Please note  You were cared for by a hospitalist during your hospital stay. If you have any questions about your discharge medications or the care you received while you were in the hospital after you are discharged, you can call the unit and asked to speak with the hospitalist on call if the hospitalist that took care of you is not available. Once you are discharged, your primary care physician will handle any further medical issues. Please note that NO REFILLS for any discharge medications will be authorized once you are discharged, as it is imperative that you return to your primary care physician (or establish a relationship with a primary care physician if you do not have one) for your aftercare needs so that they can reassess your need for medications and monitor your lab values. Today   SUBJECTIVE   Overall doing well.  VITAL SIGNS:  Blood pressure (!) 171/61, pulse 92, temperature 97.9 F (36.6 C), temperature source Oral, resp. rate 16, height _0  (1.727 m), weight 131 kg, SpO2 97 %.  I/O:    Intake/Output Summary (Last 24 hours) at 05/14/2018 1439 Last data filed at 05/14/2018 1243 Gross per 24 hour  Intake 1600 ml  Output 1445 ml  Net 155 ml    PHYSICAL EXAMINATION:  GENERAL:  62 y.o.-year-old patient lying in the bed with no acute distress.  EYES: Pupils equal, round, reactive to light and accommodation. No scleral icterus. Extraocular muscles intact.  HEENT: Head atraumatic, normocephalic. Oropharynx and nasopharynx clear.  NECK:  Supple, no jugular venous distention. No thyroid enlargement, no tenderness.  LUNGS: Normal breath sounds bilaterally, no wheezing,  rales,rhonchi or crepitation. No use of accessory muscles of respiration. Perm cath right upper chest CARDIOVASCULAR: S1, S2 normal. No murmurs, rubs, or gallops.  ABDOMEN: Soft, non-tender, non-distended. Bowel sounds present. No organomegaly or mass.  EXTREMITIES: No pedal edema, cyanosis, or clubbing.  NEUROLOGIC: Cranial nerves II through XII are intact. Muscle strength 5/5 in all extremities. Sensation intact. Gait not checked.  PSYCHIATRIC: The patient is alert and oriented x 3.  SKIN: No obvious rash, lesion, or ulcer.   DATA REVIEW:   CBC  Recent Labs  Lab 05/13/18 1116  WBC 9.8  HGB 8.1*  HCT 26.3*  PLT 201    Chemistries  Recent Labs  Lab 05/09/18 1715  05/11/18 0443  05/13/18 1116  NA 136   < >  --   --  135  K 3.3*   < >  --    < > 3.6  CL 98   < >  --   --  100  CO2 25   < >  --   --  27  GLUCOSE 179*   < >  --   --  163*  BUN 142*   < >  --   --  75*  CREATININE 4.15*   < >  --   --  2.28*  CALCIUM 8.4*   < >  --   --  7.5*  MG 1.2*   < > 2.1  --   --   AST 22  --   --   --   --   ALT 19  --   --   --   --   ALKPHOS 147*  --   --   --   --   BILITOT 0.6  --   --   --   --    < > = values in this interval not displayed.    Microbiology Results   Recent Results (from the past 240 hour(s))  C difficile quick scan w PCR reflex     Status: Abnormal   Collection Time: 05/09/18  9:46 PM  Result Value Ref Range Status   C Diff antigen POSITIVE (A) NEGATIVE Final   C Diff toxin NEGATIVE NEGATIVE Final   C Diff interpretation Results are indeterminate. See PCR results.  Final    Comment: Performed at [facility], [place], [address] [place], [place] [address]  C. Diff by PCR, Reflexed     Status: None   Collection Time: 05/09/18  9:46 PM  Result Value Ref Range Status   Toxigenic C. Difficile by PCR NEGATIVE NEGATIVE Final    Comment: Patient is colonized with non toxigenic C. difficile. May not need treatment unless significant symptoms are  present. Performed at [facility], [address] [place], [place] [address]   Gastrointestinal Panel by PCR , Stool     Status: Abnormal   Collection Time: 05/10/18  8:34 AM  Result Value Ref Range Status   Campylobacter species NOT DETECTED NOT DETECTED Final   Plesimonas shigelloides NOT DETECTED NOT DETECTED Final   Salmonella species NOT DETECTED NOT DETECTED Final   Yersinia enterocolitica NOT DETECTED NOT DETECTED Final   Vibrio species NOT DETECTED NOT DETECTED Final   Vibrio cholerae NOT DETECTED NOT DETECTED Final   Enteroaggregative E coli (EAEC) NOT DETECTED NOT DETECTED Final   Enteropathogenic E coli (EPEC) DETECTED (A) NOT DETECTED Final    Comment: CRITICAL RESULT CALLED TO, READ BACK BY AND VERIFIED WITH: JOSH SIMSER AT 1504 ON 05/10/18,KP    Enterotoxigenic E coli (ETEC) NOT DETECTED NOT DETECTED Final   Shiga like toxin producing E coli (STEC) NOT DETECTED NOT DETECTED Final   Shigella/Enteroinvasive E coli (EIEC) NOT DETECTED NOT DETECTED Final   Cryptosporidium NOT DETECTED NOT DETECTED Final   Cyclospora cayetanensis NOT DETECTED NOT DETECTED Final   Entamoeba histolytica NOT DETECTED  NOT DETECTED Final   Giardia lamblia NOT DETECTED NOT DETECTED Final   Adenovirus F40/41 NOT DETECTED NOT DETECTED Final   Astrovirus NOT DETECTED NOT DETECTED Final   Norovirus GI/GII NOT DETECTED NOT DETECTED Final   Rotavirus A NOT DETECTED NOT DETECTED Final   Sapovirus (I, II, IV, and V) NOT DETECTED NOT DETECTED Final    Comment: Performed at [facility], [address] [place], [place] [address]    RADIOLOGY:  No results found.   Management plans discussed with the patient, family and they are in agreement.  CODE STATUS:     Code Status Orders  (From admission, onward)         Start     Ordered   05/10/18 0122  Full code  Continuous     05/10/18 0121        Code Status History    Date Active Date Inactive Code Status Order ID  Comments User Context   03/08/2018 0925 03/10/2018 0312 Full Code [id number]  Katha Cabal, MD Inpatient   12/25/2017 0059 12/28/2017 1721 Full Code [id number]  Toy Baker, MD ED   10/03/2017 1803 10/11/2017 1625 Full Code [id number]  Epifanio Lesches, MD ED   09/14/2017 1310 09/20/2017 1434 Full Code [id number]  Max Sane, MD ED   08/15/2017 2306 08/19/2017 2039 Full Code [id number]  Robert Bellow, MD Inpatient   08/11/2017 1350 08/15/2017 2306 Full Code [id number]  Demetrios Loll, MD Inpatient   06/02/2017 0234 06/06/2017 1536 Full Code [id number]  Caren Griffins, MD Inpatient   04/28/2017 0516 05/02/2017 1624 Full Code [id number]  Rise Patience, MD ED   03/17/2017 1222 03/19/2017 2030 Full Code [id number]  Liliane Shi, PA-C ED   12/13/2016 0345 12/14/2016 1647 Full Code [id number]  Reubin Milan, MD Inpatient   09/13/2016 1149 09/19/2016 1458 Full Code [id number]  Flossie Dibble, MD ED   09/04/2016 0137 09/05/2016 1801 Full Code [id number]  Etta Quill, DO ED   08/21/2016 1846 08/22/2016 1733 Full Code [id number]  Thurnell Lose, MD Inpatient   03/18/2015 0939 03/19/2015 1802 Full Code [id number]  Dellia Nims, MD Inpatient   02/15/2015 1700 02/18/2015 1817 Full Code [id number]  Francesca Oman, DO Inpatient   10/02/2014 1524 10/04/2014 1649 Full Code [id number]  Samella Parr, NP Inpatient   03/25/2012 0531 04/03/2012 1931 Full Code [id number]  Maudry Diego, RN Inpatient      TOTAL TIME TAKING CARE OF THIS PATIENT: 40 minutes.    [name] M.D on 05/14/2018 at 2:39 PM  Between 7am to 6pm - Pager - [phone] After 6pm go to [email or web address] - password [email or web address]  Office  [phone]  CC: Primary care physician; Heather Daring, PA-C

## 2018-05-14 NOTE — Progress Notes (Signed)
Machine set up

## 2018-05-16 ENCOUNTER — Telehealth: Payer: Self-pay

## 2018-05-16 NOTE — Telephone Encounter (Signed)
Transition Care Management Follow-up Telephone Call  Date of discharge and from where: [facility] on 05/14/18.  How have you been since you were released from the hospital? Still in pain where the chest tube was placed. Pt states the pain level is currently a 10, but pt hasn't started the Vicodin yet. Pt just received the Rx today (was just delivered). Declines any other s/s. Starts dialysis tomorrow.  Any questions or concerns? No   Items Reviewed:  Did the pt receive and understand the discharge instructions provided? Yes   Medications obtained and verified? Will review at [facility] apt.   Any new allergies since your discharge? No   Dietary orders reviewed? Yes  Do you have support at home? Yes   Other (ie: DME, Home Health, etc) N/A  Functional Questionnaire: (I = Independent and D = Dependent)  Bathing/Dressing- I   Meal Prep- I  Eating- I  Maintaining continence- I  Transferring/Ambulation- I  Managing Meds- I   Follow up appointments reviewed:    PCP Hospital f/u appt confirmed? Yes  scheduled to see [name] on 05/25/18 @ 11:00 AM.  [facility] f/u appt confirmed? No, pt to call and set apt up.  Are transportation arrangements needed? No   If their condition worsens, is the pt aware to call  their PCP or go to the ED? Yes  Was the patient provided with contact information for the PCP's office or ED? Yes  Was the pt encouraged to call back with questions or concerns? Yes

## 2018-05-24 NOTE — Progress Notes (Deleted)
Patient: [name] Female    DOB: [birth year]   62 y.o.   MRN: [id number] Visit Date: 05/24/2018  Today's Provider: [name], PA-C   No chief complaint on file.  Subjective:    HPI  Follow up Hospitalization  Patient was admitted to [facility] on 05/09/18 and discharged on 05/14/18. She was treated for End-Stage Renal Disease now started on Hemodialysis.  Treatment for this included Started on Hemodialysis. Patient's dialysis has been scheduled for Tuesday, Thursday and Saturday at [facility]. Telephone follow up was done on 05/16/18 She reports {DESC; EXCELLENT/GOOD/FAIR:19992} compliance with treatment. She reports this condition is {improved/worse/unchanged:3041574}.  ------------------------------------------------------------------------------------       Allergies  Allergen Reactions  . Shellfish Allergy Anaphylaxis and Swelling  . Diazepam Other (See Comments)    "felt like out of body experience"  . Morphine And Related Itching     Current Outpatient Medications:  .  allopurinol (ZYLOPRIM) 100 MG tablet, Take 1 tablet (100 mg total) by mouth daily., Disp: 30 tablet, Rfl: 1 .  aspirin EC 81 MG EC tablet, Take 1 tablet (81 mg total) by mouth daily., Disp: , Rfl:  .  atorvastatin (LIPITOR) 80 MG tablet, Take 1 tablet (80 mg total) by mouth daily at 6 PM. (Patient taking differently: Take 80 mg by mouth daily. ), Disp: 90 tablet, Rfl: 3 .  carvedilol (COREG) 25 MG tablet, TAKE 1 TABLET BY MOUTH 2 TIMES DAILY WITH A MEAL. (Patient taking differently: Take 25 mg by mouth 2 (two) times daily with a meal. ), Disp: 180 tablet, Rfl: 3 .  docusate sodium (COLACE) 100 MG capsule, Take 1 capsule (100 mg total) by mouth 2 (two) times daily as needed for mild constipation., Disp: 30 capsule, Rfl: 0 .  FEROSUL 325 (65 Fe) MG tablet, TAKE (1) TABLET BY MOUTH EVERY OTHER DAY., Disp: 45 tablet, Rfl: 1 .  fluticasone (FLONASE) 50 MCG/ACT nasal spray, Place 2 sprays  into both nostrils daily as needed for allergies or rhinitis., Disp: 48 g, Rfl: 1 .  Fluticasone-Salmeterol (ADVAIR DISKUS) 250-50 MCG/DOSE AEPB, Inhale 1 puff into the lungs 2 (two) times daily., Disp: 180 each, Rfl: 3 .  glucose blood (ACCU-CHEK GUIDE) test strip, To check blood sugar before meals and bedtime, Disp: 400 each, Rfl: 12 .  hydrALAZINE (APRESOLINE) 100 MG tablet, Take 1 tablet (100 mg total) by mouth 3 (three) times daily., Disp: 270 tablet, Rfl: 1 .  HYDROcodone-acetaminophen (NORCO/VICODIN) 5-325 MG tablet, Take 1 tablet by mouth every 8 (eight) hours as needed for moderate pain., Disp: 15 tablet, Rfl: 0 .  hydrOXYzine (ATARAX/VISTARIL) 50 MG tablet, Take 1 tablet (50 mg total) by mouth 2 (two) times daily as needed., Disp: 180 tablet, Rfl: 1 .  insulin aspart (NOVOLOG FLEXPEN) 100 UNIT/ML FlexPen, Inject 25 Units into the skin 3 (three) times daily with meals., Disp: 15 mL, Rfl: 11 .  Insulin Pen Needle (PEN NEEDLES) 30G X 8 MM MISC, 1 each by Does not apply route 3 (three) times daily before meals. AC for novolog injections, Disp: 100 each, Rfl: 11 .  isosorbide mononitrate (IMDUR) 30 MG 24 hr tablet, TAKE (3) TABLETS BY MOUTH ONCE DAILY., Disp: 90 tablet, Rfl: 3 .  Lancet Devices (ACCU-CHEK SOFTCLIX) lancets, Use as instructed daily., Disp: 1 each, Rfl: 5 .  LANTUS SOLOSTAR 100 UNIT/ML Solostar Pen, INJECT 60 UNITS S.Q. TWICE DAILY. (Patient taking differently: Inject 65 Units into the skin 2 (two) times daily. ),  Disp: 24 mL, Rfl: 11 .  liraglutide (VICTOZA) 18 MG/3ML SOPN, Start with 0.6mg  daily and increase by 0.6mg  per week until max dose of 1.8 mg dose achieved., Disp: 1 pen, Rfl: 6 .  loratadine (CLARITIN) 10 MG tablet, TAKE 1 TABLET BY MOUTH ONCE DAILY., Disp: 90 tablet, Rfl: 1 .  montelukast (SINGULAIR) 10 MG tablet, TAKE ONE TABLET BY MOUTH AT BEDTIME., Disp: 90 tablet, Rfl: 1 .  nitroGLYCERIN (NITROSTAT) 0.4 MG SL tablet, Place 1 tablet (0.4 mg total) under the tongue  every 5 (five) minutes x 3 doses as needed for chest pain., Disp: 25 tablet, Rfl: 12 .  omeprazole (PRILOSEC) 20 MG capsule, Take 1 capsule (20 mg total) by mouth daily., Disp: 90 capsule, Rfl: 3 .  potassium chloride SA (K-DUR,KLOR-CON) 20 MEQ tablet, TAKE 1 TABLET BY MOUTH TWICE DAILY, Disp: 180 tablet, Rfl: 1 .  pregabalin (LYRICA) 75 MG capsule, Take 1 capsule (75 mg total) by mouth 2 (two) times daily., Disp: 180 capsule, Rfl: 1 .  PROAIR HFA 108 (90 Base) MCG/ACT inhaler, INHALE 2 PUFFS BY MOUTH EVERY 4 HOURS AS NEEDED FOR SHORTNESS OF BREATH, Disp: 8.5 g, Rfl: 5 .  SENNA PLUS 8.6-50 MG tablet, Take 1 tablet by mouth daily as needed. , Disp: , Rfl: 5 .  ticagrelor (BRILINTA) 90 MG TABS tablet, Take 1 tablet (90 mg total) by mouth 2 (two) times daily., Disp: 180 tablet, Rfl: 3 .  torsemide (DEMADEX) 20 MG tablet, TAKE (2) TABLETS BY MOUTH TWICE DAILY., Disp: 120 tablet, Rfl: 0 .  triamcinolone cream (KENALOG) 0.1 %, APPLY TO AFFECTED AREA TWICE DAILY, Disp: 80 g, Rfl: 0 .  valACYclovir (VALTREX) 500 MG tablet, TAKE (1) TABLET BY MOUTH EVERY OTHER DAY. (Patient taking differently: Take 500 mg by mouth every other day. ), Disp: 45 tablet, Rfl: 1 .  Vitamin D, Ergocalciferol, (DRISDOL) 50000 units CAPS capsule, TAKE 1 CAPSULE BY MOUTH ONCE A MONTH, Disp: 3 capsule, Rfl: 3  Review of Systems  Social History   Tobacco Use  . Smoking status: Former Smoker    Types: Cigarettes    Last attempt to quit: 10/25/1980    Years since quitting: 37.6  . Smokeless tobacco: Never Used  . Tobacco comment: quit smoking in 1982  Substance Use Topics  . Alcohol use: Yes    Comment: rarely   Objective:   There were no vitals taken for this visit. There were no vitals filed for this visit.   Physical Exam      Assessment & Plan:           [name], PA-C  [facility]

## 2018-05-25 ENCOUNTER — Inpatient Hospital Stay: Payer: Medicaid Other | Admitting: Physician Assistant

## 2018-05-26 ENCOUNTER — Other Ambulatory Visit: Payer: Self-pay | Admitting: Physician Assistant

## 2018-05-26 DIAGNOSIS — D631 Anemia in chronic kidney disease: Secondary | ICD-10-CM

## 2018-05-26 DIAGNOSIS — J301 Allergic rhinitis due to pollen: Secondary | ICD-10-CM

## 2018-05-26 DIAGNOSIS — J9601 Acute respiratory failure with hypoxia: Secondary | ICD-10-CM

## 2018-05-26 DIAGNOSIS — G629 Polyneuropathy, unspecified: Secondary | ICD-10-CM

## 2018-05-26 DIAGNOSIS — A6000 Herpesviral infection of urogenital system, unspecified: Secondary | ICD-10-CM

## 2018-05-26 DIAGNOSIS — E1142 Type 2 diabetes mellitus with diabetic polyneuropathy: Secondary | ICD-10-CM

## 2018-05-26 DIAGNOSIS — N183 Chronic kidney disease, stage 3 unspecified: Secondary | ICD-10-CM

## 2018-05-26 DIAGNOSIS — I10 Essential (primary) hypertension: Secondary | ICD-10-CM

## 2018-05-26 NOTE — Telephone Encounter (Signed)
[facility] faxed refill request for the following medications:  pregabalin (LYRICA) 75 MG capsule  90 ay supply  Last Rx: 01/10/18 LOV: 04/29/18 Please advise. Thanks TNP

## 2018-05-27 MED ORDER — PREGABALIN 75 MG PO CAPS: 75.0000 mg | ORAL_CAPSULE | Freq: Two times a day (BID) | ORAL | 1 refills | Status: DC

## 2018-05-27 NOTE — Telephone Encounter (Signed)
refilled 

## 2018-05-30 IMAGING — DX DG HAND COMPLETE 3+V*L*
3 series · 3 of 3 positions shown · non-contrast
Comparison: Left hand series 9544.

CLINICAL DATA: 59-year-old female after blunt trauma in freezer
door. Left middle finger pain. Initial encounter.

EXAM:
LEFT HAND - COMPLETE 3+ VIEW

[hand pa]
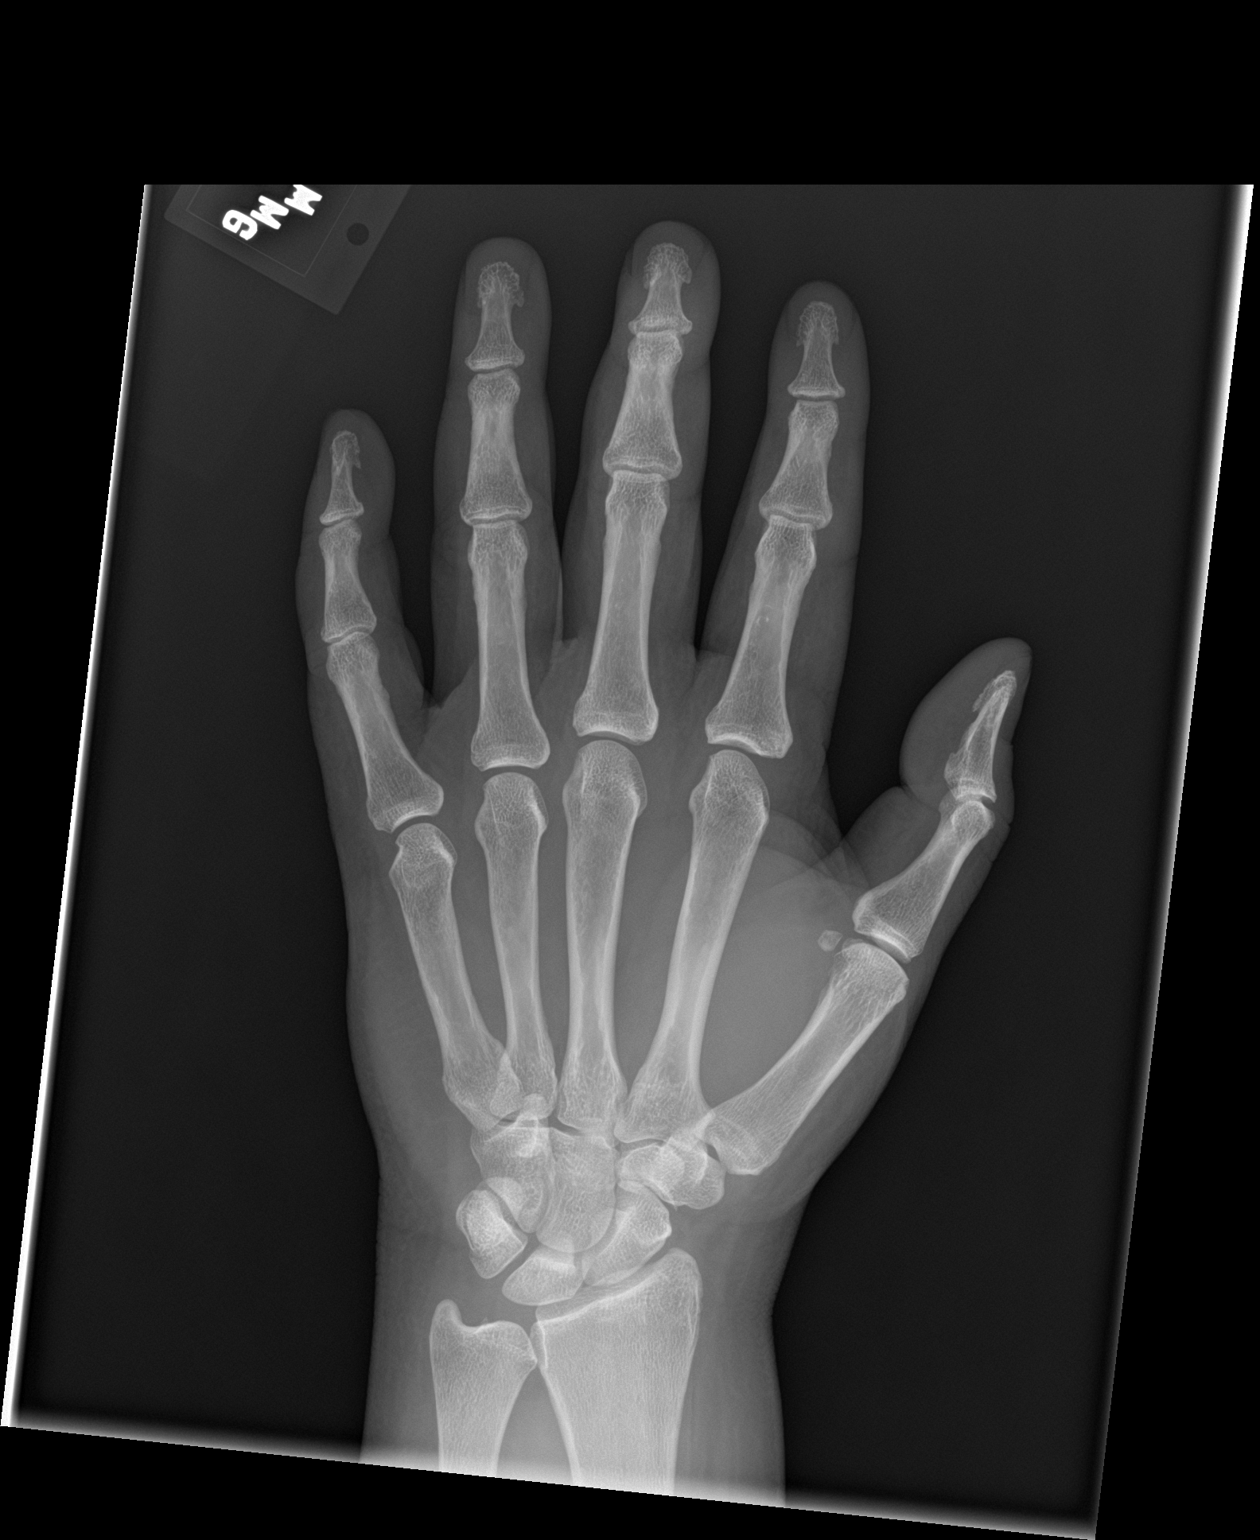

[hand obl]
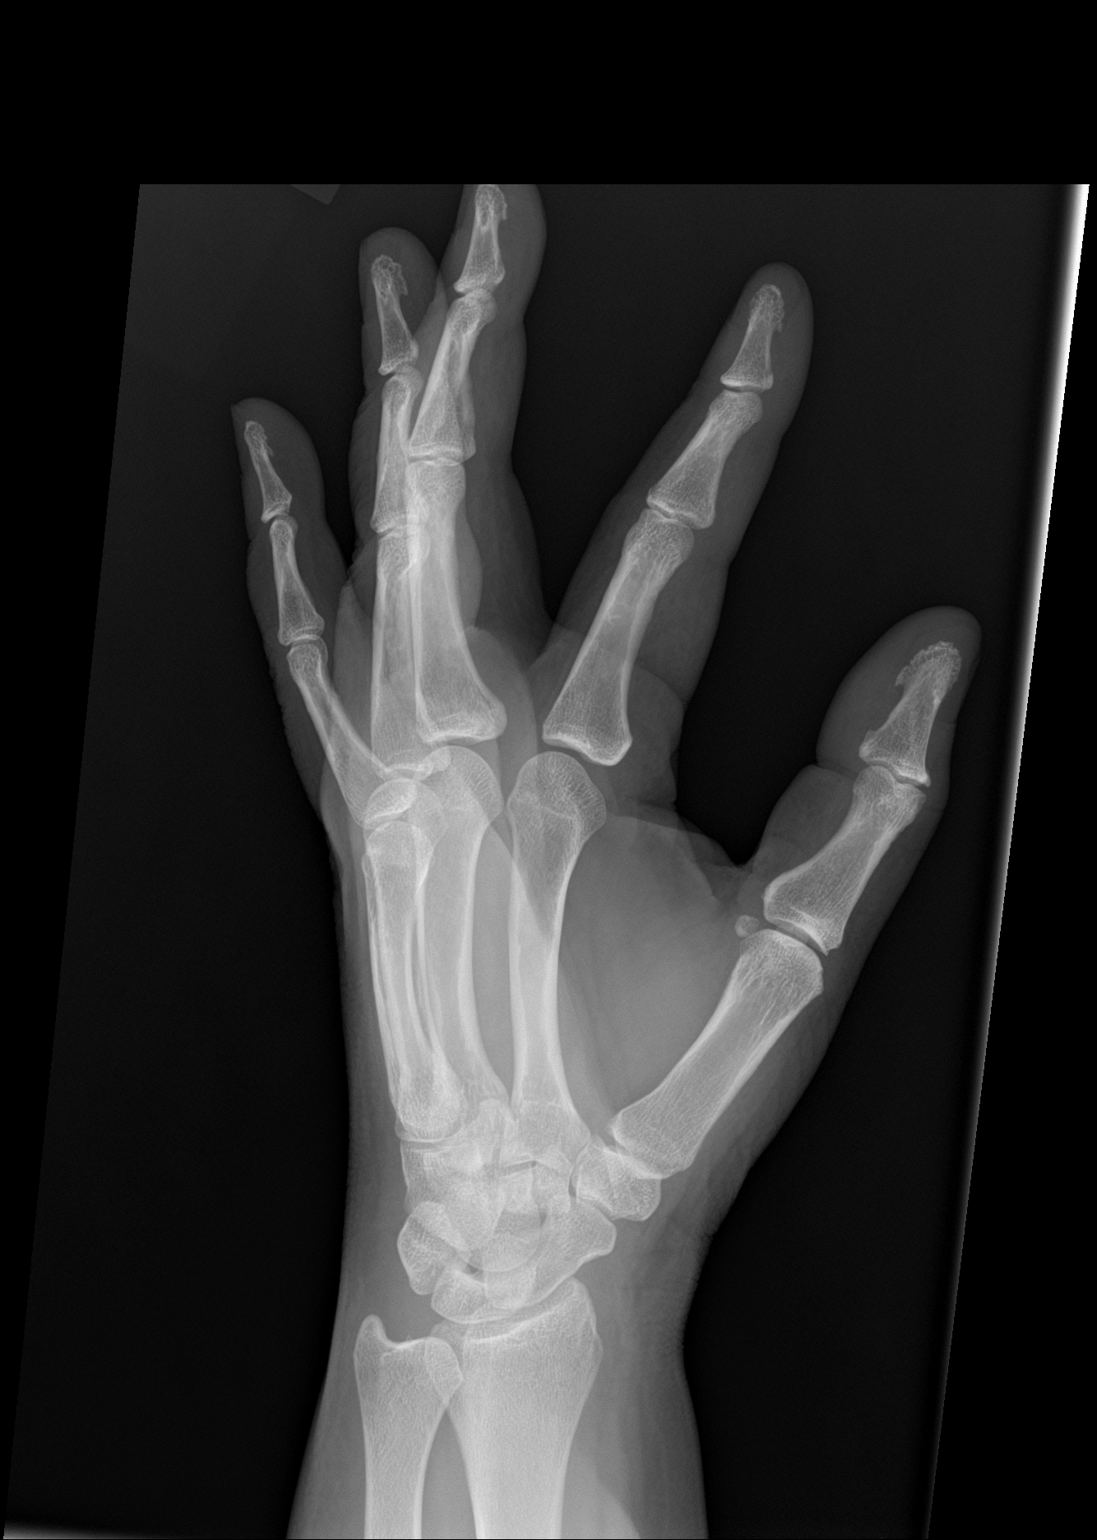

[hand lat]
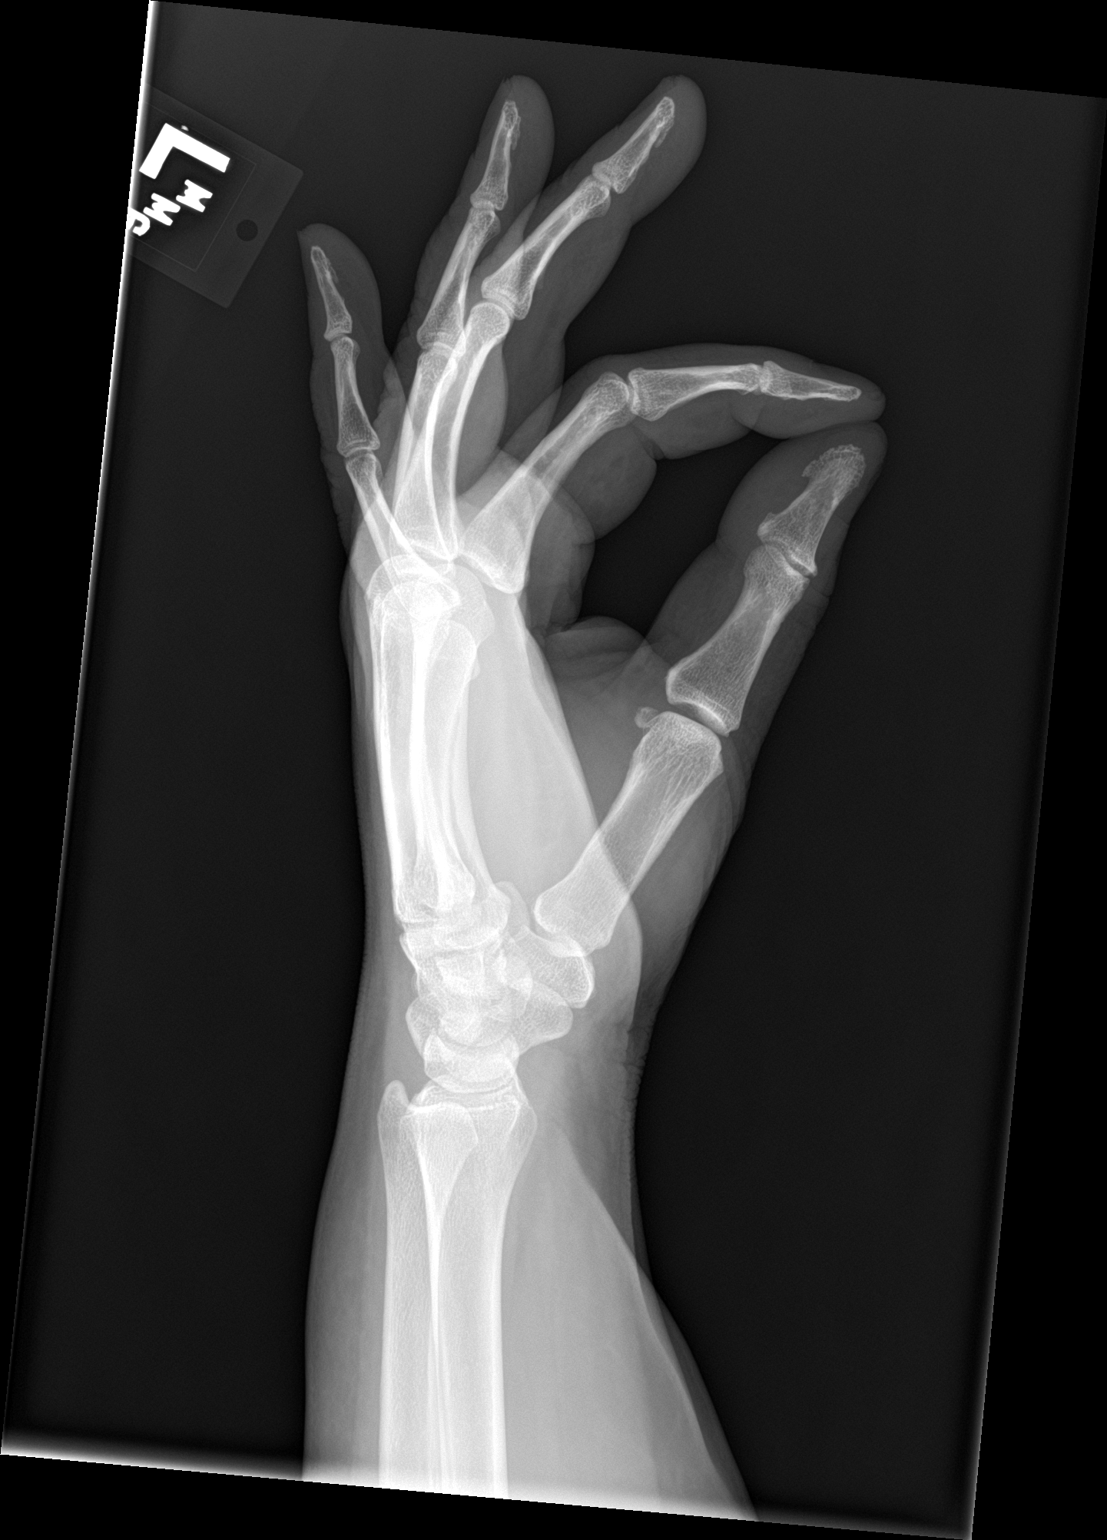

[3 of 3 positions shown; findings below may reference images not displayed]

FINDINGS: Distal radius and ulna intact. Carpal bone alignment remains normal.
Metacarpals are stable and intact. Phalanges appear intact. Distal
joint spaces and alignment preserved. No acute osseous abnormality
identified.
IMPRESSION: No acute fracture or dislocation identified about the left hand.

## 2018-05-31 ENCOUNTER — Other Ambulatory Visit (INDEPENDENT_AMBULATORY_CARE_PROVIDER_SITE_OTHER): Payer: Self-pay | Admitting: Vascular Surgery

## 2018-05-31 DIAGNOSIS — N186 End stage renal disease: Secondary | ICD-10-CM

## 2018-06-01 ENCOUNTER — Encounter

## 2018-06-01 ENCOUNTER — Encounter (INDEPENDENT_AMBULATORY_CARE_PROVIDER_SITE_OTHER): Payer: Self-pay | Admitting: Vascular Surgery

## 2018-06-01 ENCOUNTER — Ambulatory Visit (INDEPENDENT_AMBULATORY_CARE_PROVIDER_SITE_OTHER): Payer: Medicaid Other

## 2018-06-01 ENCOUNTER — Telehealth: Payer: Self-pay | Admitting: Cardiovascular Disease

## 2018-06-01 ENCOUNTER — Telehealth: Payer: Self-pay | Admitting: Physician Assistant

## 2018-06-01 ENCOUNTER — Encounter: Payer: Self-pay | Admitting: Physician Assistant

## 2018-06-01 ENCOUNTER — Ambulatory Visit (INDEPENDENT_AMBULATORY_CARE_PROVIDER_SITE_OTHER): Payer: Medicaid Other | Admitting: Physician Assistant

## 2018-06-01 ENCOUNTER — Ambulatory Visit (INDEPENDENT_AMBULATORY_CARE_PROVIDER_SITE_OTHER): Payer: Medicaid Other | Admitting: Vascular Surgery

## 2018-06-01 VITALS — BP 145/71 | HR 94 | Resp 17 | Wt 293.4 lb

## 2018-06-01 VITALS — BP 138/60 | HR 92 | Temp 98.0°F | Resp 16 | Wt 295.2 lb

## 2018-06-01 DIAGNOSIS — N186 End stage renal disease: Secondary | ICD-10-CM

## 2018-06-01 DIAGNOSIS — IMO0002 Reserved for concepts with insufficient information to code with codable children: Secondary | ICD-10-CM

## 2018-06-01 DIAGNOSIS — E1165 Type 2 diabetes mellitus with hyperglycemia: Secondary | ICD-10-CM

## 2018-06-01 DIAGNOSIS — E1122 Type 2 diabetes mellitus with diabetic chronic kidney disease: Secondary | ICD-10-CM | POA: Diagnosis not present

## 2018-06-01 DIAGNOSIS — L299 Pruritus, unspecified: Secondary | ICD-10-CM

## 2018-06-01 DIAGNOSIS — E785 Hyperlipidemia, unspecified: Secondary | ICD-10-CM

## 2018-06-01 DIAGNOSIS — E118 Type 2 diabetes mellitus with unspecified complications: Secondary | ICD-10-CM | POA: Diagnosis not present

## 2018-06-01 DIAGNOSIS — Z992 Dependence on renal dialysis: Secondary | ICD-10-CM | POA: Diagnosis not present

## 2018-06-01 LAB — POCT GLYCOSYLATED HEMOGLOBIN (HGB A1C)
Est. average glucose Bld gHb Est-mCnc: 220
Hemoglobin A1C: 9.3 % — AB (ref 4.0–5.6)

## 2018-06-01 MED ORDER — HYDROXYZINE HCL 25 MG PO TABS: 25.0000 mg | ORAL_TABLET | Freq: Four times a day (QID) | ORAL | 1 refills | Status: DC | PRN

## 2018-06-01 MED ORDER — HYDROXYZINE PAMOATE 100 MG PO CAPS: 100.0000 mg | ORAL_CAPSULE | Freq: Two times a day (BID) | ORAL | 0 refills | Status: DC | PRN

## 2018-06-01 NOTE — Telephone Encounter (Signed)
Dose changed to hydroxyzine 25mg  Take one every 6 hrs prn itching.

## 2018-06-01 NOTE — Patient Instructions (Signed)
Dialysis Diet Dialysis is a treatment that you may undergo if you have significant damage to your kidneys. Dialysis replaces some of the work that the kidneys do. One of the jobs that it takes over is removing wastes, salt, and extra water from your blood. This helps to keep the amount of potassium and other nutrients in your blood at healthy levels. When you need dialysis, it is important to pay careful attention to your diet. Between dialysis sessions, certain nutrients can build up in your blood and cause you to get sick. Vitamins and minerals are an important part of a healthy diet and should not be avoided entirely. However, it is commonly recommended that you limit your intake of potassium, phosphorus, and sodium. It may also be necessary to restrict other nutrients, such as carbohydrates or fat, if you have other health conditions. Your health care provider or dietitian can help you to determine the amount of these nutrients that is right for you. What is my plan? Your dietitian will help you to design a meal plan that is specific to your needs. Generally, meal plans include:  Grains, 6-11 servings per day. One serving is equal to 1 slice of bread or  cup of cooked rice or pasta.  Low-potassium vegetables, 2-3 servings per day. One serving is equal to  cup.  Low-potassium fruits, 2-3 servings per day. One serving is equal to  cup.  Meats and other protein sources, 8-11 oz per day.  Dairy,  cup per day.  Your dietitian will provide you with specific instructions about the amount of fluids you can have each day. What do I need to know about a dialysis diet?  Limit your intake of potassium. Potassium is found in milk, fruits, and vegetables.  Limit your intake of phosphorus. Phosphorus is found in milk, cheese, beans, nuts, and carbonated beverages. Avoid whole-grain and high-fiber foods because they contain high amounts of phosphorus.  Limit your intake of sodium. Foods that are high  in sodium include processed and cured meats, ready-made frozen meals, canned vegetables, and salty snack foods. Do not use salt substitutes because they contain potassium.  If you were instructed to restrict your fluid intake, follow your health care provider's specific instructions. You may be told to: ? Write down what you drink and any foods you eat that are made mostly from water, such as gelatin and soups. ? Drink from small cups to help control how much you drink.  Ask your health care provider if you should regularly take an over-the-counter medicine that binds phosphorus, such as antacid products that contain calcium carbonate.  Take vitamin and mineral supplements only as directed by your health care provider.  Eat high-quality proteins, such as meat, poultry, fish, and eggs. Limit low-quality plant-based proteins, such as nuts and beans.  Cut potatoes into small pieces and boil them in unsalted water before you eat them. This can help to remove some potassium from the potato.  Drain all fluid from cooked vegetables and canned fruits before eating them. What foods can I eat? Grains White bread. White rice. Cooked cereal. Unsalted popcorn. Tortillas. Pasta. Vegetables Fresh or frozen broccoli, carrots, and green beans. Cabbage. Cauliflower. Celery. Cucumbers. Eggplant. Radishes. Zucchini. Fruits Apples. Fresh or frozen berries. Fresh or canned pears, peaches, and pineapple. Grapes. Plums. Meats and Other Protein Sources Fresh or frozen beef, pork, chicken, and fish. Eggs. Low-sodium canned tuna or salmon. Dairy Cream cheese. Heavy cream. Ricotta cheese. Beverages Apple cider. Cranberry juice. Grape juice.  Lemonade. Black coffee. Condiments Herbs. Spices. Jam and jelly. Honey. Sweets and Desserts Sherbet. Cakes. Cookies. Fats and Oils Olive oil, canola oil, and safflower oil. Other Non-dairy creamer. Non-dairy whipped topping. Homemade broth without salt. The items listed  above may not be a complete list of recommended foods or beverages. Contact your dietitian for more options. What foods are not recommended? Grains Whole-grain bread. Whole-grain pasta. High-fiber cereal. Vegetables Potatoes. Beets. Tomatoes. Winter squash and pumpkin. Asparagus. Spinach. Parsnips. Fruits Star fruit. Bananas. Oranges. Kiwi. Nectarines. Prunes. Melon. Dried fruit. Avocado. Meats and Other Protein Sources Canned, smoked, and cured meats. Soil scientist. Sardines. Nuts and seeds. Peanut butter. Beans and legumes. Dairy Milk. Buttermilk. Yogurt. Cheese and cottage cheese. Processed cheese spreads. Beverages Orange juice. Prune juice. Carbonated soft drinks. Condiments Salt. Salt substitutes. Soy sauce. Sweets and Desserts Ice cream. Chocolate. Candied nuts. Fats and Oils Butter. Margarine. Other Ready-made frozen meals. Canned soups. The items listed above may not be a complete list of foods and beverages to avoid. Contact your dietitian for more information. This information is not intended to replace advice given to you by your health care provider. Make sure you discuss any questions you have with your health care provider. Document Released: 03/26/2004 Document Revised: 11/07/2015 Document Reviewed: 01/30/2014 Elsevier Interactive Patient Education  2018 [place] Kidney Disease End-stage kidney disease occurs when the kidneys are so damaged that they cannot do their job. The kidneys are two organs that do many important jobs in the body, which include:  Removing wastes and extra fluids from the blood.  Making hormones that maintain the amount of fluid in your tissues and blood vessels.  Maintaining the right amount of fluids and chemicals in the body.  When the kidneys are damaged and cannot do their job, life-threatening problems occur. Without the help of the kidneys, toxins build up in the blood. In end-stage kidney disease, the  kidneys cannot get better. What are the causes? End-stage kidney disease usually occurs when a long-lasting (chronic) kidney disease gets worse. It may also occur after the kidneys are suddenly damaged (acute kidney injury). What increases the risk? This condition is more likely to develop in people who are:  Older than age 7.  Female.  Of African-American descent.  Current smokers or former smokers.  Obese.  You may also have an increased risk for end-stage kidney disease if you:  Have a family history of chronic kidney disease (CKD).  Have had kidney disease for many years.  Have other longstanding medical conditions that affect the kidneys, such as: ? Cardiovascular disease including high blood pressure. ? Diabetes. ? Certain diseases that affect the immune system.  What are the signs or symptoms?  Swelling (edema) of the face, legs, ankles, or feet.  Numbness, tingling, or loss of feeling (sensation) in your hands or feet.  Tiredness (lethargy).  Nausea or vomiting.  Confusion, trouble concentrating, or loss of consciousness.  Chest pain.  Shortness of breath.  Little to no urine production.  Muscle twitches and cramps, especially in the legs.  Constant itchiness.  Loss of appetite.  Pale skin and tissue lining your eyelids (conjunctiva).  Headaches.  Abnormally dark or light skin.  Decrease in muscle size (muscle wasting).  Easy bruising.  Frequent hiccups.  Stopping of menstruation in women.  Seizures. How is this diagnosed? Your health care provider will measure your blood pressure and do some tests. These may include:  Urine tests.  Blood tests.  Imaging  tests.  A test in which a sample of tissue is removed from the kidneys to be looked at under a microscope (kidney biopsy).  How is this treated? There are two treatments for end-stage kidney disease:  A procedure that removes toxic wastes from the body (dialysis). Depending on the  type of dialysis you choose, it may be performed more than one time a day (peritoneal dialysis) or several times a week (hemodialysis).  Surgery toreceive a new kidney (kidney transplant).  In addition to having dialysis or a kidney transplant, you may need to take medicines:  To control high blood pressure (hypertension).  To control cholesterol.  To maintain healthy electrolyte levels in your blood.  You may also be given a specific diet to follow that includes requirements or limits for:  Salt (sodium).  Protein.  Phosphorous.  Potassium.  Calcium.  Follow these instructions at home:  Follow your prescribed diet.  Take over-the-counter and prescription medicines only as told by your health care provider. ? Do not take any new medicines unless approved by your health care provider. Many medicines can worsen your kidney damage. ? Do not take any vitamin and mineral supplements unless approved by your health care provider. Many nutritional supplements can worsen your kidney damage. ? The dose of some medicines that you take may need to be adjusted.  Do not use any tobacco products, such as cigarettes, chewing tobacco, and e-cigarettes. If you need help quitting, ask your health care provider.  Keep all follow-up visits as told by your health care provider. This is important.  Keep track of your blood pressure. Report changes in your blood pressure as told by your health care provider.  Achieve and maintain a healthy weight. If you need help with this, ask your health care provider.  Start or continue an exercise plan. Try to exercise at least 30 minutes a day, 5 days a week.  Stay current with immunizations as told by your health care provider. Where to find more information:  American Association of Kidney Patients: BombTimer.gl  National Kidney Foundation: www.kidney.[place]: https://mathis.com/  [facility]:  www.lifeoptions.org and www.kidneyschool.org Contact a health care provider if:  Your symptoms get worse.  You develop new symptoms. Get help right away if:  You have weakness in an arm or leg on one side of your body.  You have difficulty speaking or you are slurring your speech.  You have a sudden change in your vision.  You have a sudden, severe headache.  You have a sudden weight increase.  You have difficulty breathing.  Your symptoms suddenly get worse. This information is not intended to replace advice given to you by your health care provider. Make sure you discuss any questions you have with your health care provider. Document Released: 09/19/2003 Document Revised: 12/05/2015 Document Reviewed: 02/26/2012 Elsevier Interactive Patient Education  2017 [employer].

## 2018-06-01 NOTE — Telephone Encounter (Signed)
Please Review

## 2018-06-01 NOTE — Progress Notes (Signed)
Subjective:    Patient ID: [name], female    DOB: 06-29-1956, 62 y.o.   MRN: [id number] Chief Complaint  Patient presents with  . Follow-up    [facility] 1week vein mapping   Patient presents to discuss a permanent dialysis access.  The patient had a right PermCath placed on 05/10/2018 by Dr. Delana Meyer.  The patient presents today without complaint in regard to the functioning of her PermCath.  The patient dialyzes Tuesday Thursday Saturday.  The patient is right-hand dominant.  The patient denies any uremic symptoms.  The patient underwent bilateral upper extremity vein mapping which was amenable to creation of a left brachial basilic AV fistula creation followed by a basilic transposition.  The patient denies any issues with her left upper extremity such as pain or motor/sensory deficits.  No obstruction visualized in the left upper extremity as far as the brachial radial and ulnar arteries are noted.  Patient denies any fever, nausea vomiting.  Review of Systems  Constitutional: Negative.   HENT: Negative.   Eyes: Negative.   Respiratory: Negative.   Cardiovascular: Negative.   Gastrointestinal: Negative.   Endocrine: Negative.   Genitourinary:       ESRD  Musculoskeletal: Negative.   Skin: Negative.   Allergic/Immunologic: Negative.   Neurological: Negative.   Hematological: Negative.   Psychiatric/Behavioral: Negative.       Objective:   Physical Exam  Constitutional: She is oriented to person, place, and time. She appears well-developed and well-nourished. No distress.  HENT:  Head: Normocephalic.  Right Ear: External ear normal.  Left Ear: External ear normal.  Eyes: Pupils are equal, round, and reactive to light. Conjunctivae and EOM are normal.  Neck: Normal range of motion.  Cardiovascular: Normal rate, regular rhythm, normal heart sounds and intact distal pulses.  Pulses:      Radial pulses are 2+ on the right side, and 2+ on the left side.  Right IJ PermCath:  Intact.  Sutures are intact.  There is no signs of infection.  There is no discharge.  Pulmonary/Chest: Effort normal and breath sounds normal.  Musculoskeletal: Normal range of motion. She exhibits no edema.  Neurological: She is alert and oriented to person, place, and time.  Skin: Skin is warm and dry. She is not diaphoretic.  Psychiatric: She has a normal mood and affect. Her behavior is normal. Judgment and thought content normal.  Vitals reviewed.  BP (!) 145/71 (BP Location: Right Arm)   Pulse 94   Resp 17   Wt 293 lb 6.4 oz (133.1 kg)   BMI 44.61 kg/m   Past Medical History:  Diagnosis Date  . Anemia   . Aortic stenosis    Echo 8/18: mean 13, peak 28, LVOT/AV mean velocity 0.51  . Arthritis   . Asthma    As a child   . Bronchitis   . CAD (coronary artery disease)    a. 09/2016: 50% Ost 1st Mrg stenosis, 50% 2nd Mrg stenosis, 20% Mid-Cx, 95% Prox LAD, 40% mid-LAD, and 10% dist-LAD stenosis. Staged PCI with DES to Prox-LAD.   [name] Chronic combined systolic and diastolic CHF (congestive heart failure) ([place]) 2011   echo 2/18: EF 55-60, normal wall motion, grade 2 diastolic dysfunction, trivial AI // echo 3/18: Septal and apical HK, EF 45-50, normal wall motion, trivial AI, mild LAE, PASP 38 // echo 8/18: EF 60-65, normal wall motion, grade 1 diastolic dysfunction, calcified aortic valve leaflets, mild aortic stenosis (mean 13, peak 28, LVOT/AV  mean velocity 0.51), mild AI, moderate MAC, mild LAE, trivial TR   . Chronic kidney disease    STAGE 4  . Depression   . Diabetes mellitus Dx 1989  . GERD (gastroesophageal reflux disease)   . Gout   . Hepatitis C Dx 2013  . Hypertension Dx 1989  . Myocardial infarction ([place]) 07/2015  . Obesity   . Obstructive sleep apnea   . Pancreatitis 2013  . Pneumonia   . Refusal of blood transfusions as patient is Jehovah's Witness   . Tendinitis   . Tremors of nervous system    LEFT HAND  . Ulcer 2010   Social History   Socioeconomic  History  . Marital status: Divorced    Spouse name: Not on file  . Number of children: Not on file  . Years of education: Not on file  . Highest education level: Not on file  Occupational History  . Not on file  Social Needs  . Financial resource strain: Not on file  . Food insecurity:    Worry: Not on file    Inability: Not on file  . Transportation needs:    Medical: Not on file    Non-medical: Not on file  Tobacco Use  . Smoking status: Former Smoker    Types: Cigarettes    Last attempt to quit: 10/25/1980    Years since quitting: 37.6  . Smokeless tobacco: Never Used  . Tobacco comment: quit smoking in 1982  Substance and Sexual Activity  . Alcohol use: Yes    Comment: rarely  . Drug use: Not Currently    Types: "Crack" cocaine    Comment: 08/21/2016 "clean since 05/1998"  . Sexual activity: Not on file    Comment: Not asked  Lifestyle  . Physical activity:    Days per week: Not on file    Minutes per session: Not on file  . Stress: Not on file  Relationships  . Social connections:    Talks on phone: Not on file    Gets together: Not on file    Attends religious service: Not on file    Active member of club or organization: Not on file    Attends meetings of clubs or organizations: Not on file    Relationship status: Not on file  . Intimate partner violence:    Fear of current or ex partner: Not on file    Emotionally abused: Not on file    Physically abused: Not on file    Forced sexual activity: Not on file  Other Topics Concern  . Not on file  Social History Narrative  . Not on file   Past Surgical History:  Procedure Laterality Date  . APPLICATION OF WOUND VAC Left 08/14/2017   Procedure: APPLICATION OF WOUND VAC Exchange;  Surgeon: Robert Bellow, MD;  Location: [facility] ORS;  Service: General;  Laterality: Left;  . CHOLECYSTECTOMY    . COLONOSCOPY WITH PROPOFOL N/A 02/03/2018   Procedure: COLONOSCOPY WITH PROPOFOL;  Surgeon: Lin Landsman, MD;   Location: [facility] ENDOSCOPY;  Service: Gastroenterology;  Laterality: N/A;  . CORONARY ANGIOPLASTY  07/2015   STENT  . CORONARY STENT INTERVENTION N/A 09/18/2016   Procedure: Coronary Stent Intervention;  Surgeon: Troy Sine, MD;  Location: [place] CV LAB;  Service: Cardiovascular;  Laterality: N/A;  . DIALYSIS/PERMA CATHETER INSERTION N/A 05/10/2018   Procedure: DIALYSIS/PERMA CATHETER INSERTION;  Surgeon: Katha Cabal, MD;  Location: [place] CV LAB;  Service: Cardiovascular;  Laterality: N/A;  . DRESSING CHANGE UNDER ANESTHESIA Left 08/15/2017   Procedure: exploration of wound for bleeding;  Surgeon: Robert Bellow, MD;  Location: [facility] ORS;  Service: General;  Laterality: Left;  . ESOPHAGOGASTRODUODENOSCOPY (EGD) WITH PROPOFOL N/A 02/03/2018   Procedure: ESOPHAGOGASTRODUODENOSCOPY (EGD) WITH PROPOFOL;  Surgeon: Lin Landsman, MD;  Location: [facility] ENDOSCOPY;  Service: Gastroenterology;  Laterality: N/A;  . EYE SURGERY    . INCISION AND DRAINAGE ABSCESS Left 08/12/2017   Procedure: INCISION AND DRAINAGE ABSCESS;  Surgeon: Robert Bellow, MD;  Location: [facility] ORS;  Service: General;  Laterality: Left;  . KNEE ARTHROSCOPY    . LEFT HEART CATH N/A 09/18/2016   Procedure: Left Heart Cath;  Surgeon: Troy Sine, MD;  Location: [place] CV LAB;  Service: Cardiovascular;  Laterality: N/A;  . LEFT HEART CATH AND CORONARY ANGIOGRAPHY N/A 09/16/2016   Procedure: Left Heart Cath and Coronary Angiography;  Surgeon: Burnell Blanks, MD;  Location: [place] CV LAB;  Service: Cardiovascular;  Laterality: N/A;  . LEFT HEART CATH AND CORONARY ANGIOGRAPHY N/A 04/29/2017   Procedure: LEFT HEART CATH AND CORONARY ANGIOGRAPHY;  Surgeon: Nelva Bush, MD;  Location: [place] CV LAB;  Service: Cardiovascular;  Laterality: N/A;  . LOWER EXTREMITY ANGIOGRAPHY Right 03/08/2018   Procedure: LOWER EXTREMITY ANGIOGRAPHY;  Surgeon: Katha Cabal, MD;  Location: [place] CV  LAB;  Service: Cardiovascular;  Laterality: Right;  . TUBAL LIGATION    . TUBAL LIGATION     Family History  Problem Relation Age of Onset  . Colon cancer Mother   . Heart attack Other   . Heart attack Maternal Grandmother   . Hypertension Sister   . Hypertension Brother   . Diabetes Paternal Grandmother   . Breast cancer Neg Hx    Allergies  Allergen Reactions  . Shellfish Allergy Anaphylaxis and Swelling  . Diazepam Other (See Comments)    "felt like out of body experience"  . Morphine And Related Itching      Assessment & Plan:  Patient presents to discuss a permanent dialysis access.  The patient had a right PermCath placed on 05/10/2018 by Dr. Delana Meyer.  The patient presents today without complaint in regard to the functioning of her PermCath.  The patient dialyzes Tuesday Thursday Saturday.  The patient is right-hand dominant.  The patient denies any uremic symptoms.  The patient underwent bilateral upper extremity vein mapping which was amenable to creation of a left brachial basilic AV fistula creation followed by a basilic transposition.  The patient denies any issues with her left upper extremity such as pain or motor/sensory deficits.  No obstruction visualized in the left upper extremity as far as the brachial radial and ulnar arteries are noted.  Patient denies any fever, nausea vomiting.  1. ESRD on dialysis ([place]) - Stable Patient is currently being maintained by a right IJ PermCath that issue Vein mapping was notable for creation of a right brachial cephalic or brachial basilic however the patient is right-hand dominant did want an access placed in her dominant hand.  Vein mapping was also I am able for a left brachiobasilic creation.  The patient understands that she will have to return to the operating room approximately 4 weeks later to undergo a basilic transposition and then wait approximately 6 weeks for her fistula to mature before we we will be able to access it.   Patient also understands that her PermCath will have to remain in place until her fistula  is matured and it is running adequately.  Procedure, risks and benefits explained to the patient.  All questions answered.  The patient wishes to proceed.  2. Hyperlipidemia, unspecified hyperlipidemia type - Stable On ASA and statin. Encouraged good control as its slows the progression of atherosclerotic disease.  3. Diabetes mellitus type 2, uncontrolled, with complications ([place]) - Stable Encouraged good control as its slows the progression of atherosclerotic disease  Current Outpatient Medications on File Prior to Visit  Medication Sig Dispense Refill  . allopurinol (ZYLOPRIM) 100 MG tablet Take 1 tablet (100 mg total) by mouth daily. 30 tablet 1  . aspirin EC 81 MG EC tablet Take 1 tablet (81 mg total) by mouth daily.    [name] atorvastatin (LIPITOR) 80 MG tablet Take 1 tablet (80 mg total) by mouth daily at 6 PM. (Patient taking differently: Take 80 mg by mouth daily. ) 90 tablet 3  . carvedilol (COREG) 25 MG tablet TAKE 1 TABLET BY MOUTH 2 TIMES DAILY WITH A MEAL. (Patient taking differently: Take 25 mg by mouth 2 (two) times daily with a meal. ) 180 tablet 3  . docusate sodium (COLACE) 100 MG capsule Take 1 capsule (100 mg total) by mouth 2 (two) times daily as needed for mild constipation. 30 capsule 0  . FEROSUL 325 (65 Fe) MG tablet TAKE (1) TABLET BY MOUTH EVERY OTHER DAY. 45 tablet 1  . fluticasone (FLONASE) 50 MCG/ACT nasal spray Place 2 sprays into both nostrils daily as needed for allergies or rhinitis. 48 g 1  . Fluticasone-Salmeterol (ADVAIR DISKUS) 250-50 MCG/DOSE AEPB Inhale 1 puff into the lungs 2 (two) times daily. 180 each 3  . glucose blood (ACCU-CHEK GUIDE) test strip To check blood sugar before meals and bedtime 400 each 12  . hydrALAZINE (APRESOLINE) 100 MG tablet TAKE (1) TABLET BY MOUTH THREE TIMES DAILY 90 tablet 1  . HYDROcodone-acetaminophen (NORCO/VICODIN) 5-325 MG tablet Take 1  tablet by mouth every 8 (eight) hours as needed for moderate pain. 15 tablet 0  . hydrOXYzine (ATARAX/VISTARIL) 50 MG tablet Take 1 tablet (50 mg total) by mouth 2 (two) times daily as needed. 180 tablet 1  . insulin aspart (NOVOLOG FLEXPEN) 100 UNIT/ML FlexPen Inject 25 Units into the skin 3 (three) times daily with meals. 15 mL 11  . Insulin Pen Needle (PEN NEEDLES) 30G X 8 MM MISC 1 each by Does not apply route 3 (three) times daily before meals. AC for novolog injections 100 each 11  . isosorbide mononitrate (IMDUR) 30 MG 24 hr tablet TAKE (3) TABLETS BY MOUTH ONCE DAILY. 90 tablet 3  . Lancet Devices (ACCU-CHEK SOFTCLIX) lancets Use as instructed daily. 1 each 5  . LANTUS SOLOSTAR 100 UNIT/ML Solostar Pen INJECT 60 UNITS S.Q. TWICE DAILY. (Patient taking differently: Inject 65 Units into the skin 2 (two) times daily. ) 24 mL 11  . liraglutide (VICTOZA) 18 MG/3ML SOPN Start with 0.83m daily and increase by 0.632mper week until max dose of 1.8 mg dose achieved. 1 pen 6  . loratadine (CLARITIN) 10 MG tablet TAKE 1 TABLET BY MOUTH ONCE DAILY. 90 tablet 1  . montelukast (SINGULAIR) 10 MG tablet TAKE ONE TABLET BY MOUTH AT BEDTIME. 90 tablet 1  . nitroGLYCERIN (NITROSTAT) 0.4 MG SL tablet Place 1 tablet (0.4 mg total) under the tongue every 5 (five) minutes x 3 doses as needed for chest pain. 25 tablet 12  . omeprazole (PRILOSEC) 20 MG capsule Take 1 capsule (20 mg total) by  mouth daily. 90 capsule 3  . potassium chloride SA (K-DUR,KLOR-CON) 20 MEQ tablet TAKE 1 TABLET BY MOUTH TWICE DAILY 180 tablet 1  . pregabalin (LYRICA) 75 MG capsule Take 1 capsule (75 mg total) by mouth 2 (two) times daily. 180 capsule 1  . PROAIR HFA 108 (90 Base) MCG/ACT inhaler INHALE 2 PUFFS BY MOUTH EVERY 4 HOURS AS NEEDED FOR SHORTNESS OF BREATH 8.5 g 5  . SENNA PLUS 8.6-50 MG tablet TAKE ONE TABLET BY MOUTH AT BEDTIME. 90 tablet 1  . ticagrelor (BRILINTA) 90 MG TABS tablet Take 1 tablet (90 mg total) by mouth 2 (two)  times daily. 180 tablet 3  . torsemide (DEMADEX) 20 MG tablet TAKE (2) TABLETS BY MOUTH TWICE DAILY. 120 tablet 0  . triamcinolone cream (KENALOG) 0.1 % APPLY TO AFFECTED AREA TWICE DAILY 80 g 0  . valACYclovir (VALTREX) 500 MG tablet TAKE (1) TABLET BY MOUTH EVERY OTHER DAY. 45 tablet 1  . Vitamin D, Ergocalciferol, (DRISDOL) 50000 units CAPS capsule TAKE 1 CAPSULE BY MOUTH ONCE A MONTH 3 capsule 3  . [DISCONTINUED] gabapentin (NEURONTIN) 100 MG capsule Take 1 capsule (100 mg total) by mouth 3 (three) times daily. 90 capsule 1   No current facility-administered medications on file prior to visit.    There are no Patient Instructions on file for this visit. No follow-ups on file.  Siddharth Babington A Rabecka Brendel, PA-C

## 2018-06-01 NOTE — Telephone Encounter (Signed)
[name] [place] [phone]  Is questioning the change in  hydrOXYzine (VISTARIL) 100 MG capsule - dosage form and dosage amount.  Please call pharmacist back to confirm for pt's refill.  Thanks, [employer]

## 2018-06-01 NOTE — Telephone Encounter (Signed)
LM on the pharmacy voicemail

## 2018-06-01 NOTE — Telephone Encounter (Signed)
° °  [facility] Pre-operative Risk Assessment    Request for surgical clearance:  1. What type of surgery is being performed? Fistula Placement   2. When is this surgery scheduled? Pending  3. What type of clearance is required (medical clearance vs. Pharmacy clearance to hold med vs. Both)? Medical Clearance  4. Are there any medications that need to be held prior to surgery and how long? Not sure about pt's medicine   5. Practice name and name of physician performing surgery?  Sr Belenda Cruise [facility]   6. What is your office phone number  [phone]   7.   What is your office fax number [phone]  8.   Anesthesia type (None, local, MAC, general) ? General   [name] 06/01/2018, 2:41 PM  _________________________________________________________________   (provider comments below)

## 2018-06-01 NOTE — Progress Notes (Signed)
Patient: [name] Female    DOB: 1956/01/23   62 y.o.   MRN: [id number] Visit Date: 06/01/2018  Today's Provider: [name], PA-C   Chief Complaint  Patient presents with  . Hospitalization Follow-up   Subjective:    HPI  Follow up Hospitalization  Patient was admitted to [facility] on 05/09/18 and discharged on 05/14/18 She was treated for end-Stage Renal Disease now started on Hemodialysis. Treatment for this included Dialysis scheduled Tuesday,Thursday and Saturday at [facility]. Telephone follow up was done on 05/26/18 She reports excellent compliance with treatment. She reports this condition is Improved. ------------------------------------------------------------------------------------    Allergies  Allergen Reactions  . Shellfish Allergy Anaphylaxis and Swelling  . Diazepam Other (See Comments)    "felt like out of body experience"  . Morphine And Related Itching     Current Outpatient Medications:  .  allopurinol (ZYLOPRIM) 100 MG tablet, Take 1 tablet (100 mg total) by mouth daily., Disp: 30 tablet, Rfl: 1 .  aspirin EC 81 MG EC tablet, Take 1 tablet (81 mg total) by mouth daily., Disp: , Rfl:  .  atorvastatin (LIPITOR) 80 MG tablet, Take 1 tablet (80 mg total) by mouth daily at 6 PM. (Patient taking differently: Take 80 mg by mouth daily. ), Disp: 90 tablet, Rfl: 3 .  carvedilol (COREG) 25 MG tablet, TAKE 1 TABLET BY MOUTH 2 TIMES DAILY WITH A MEAL. (Patient taking differently: Take 25 mg by mouth 2 (two) times daily with a meal. ), Disp: 180 tablet, Rfl: 3 .  docusate sodium (COLACE) 100 MG capsule, Take 1 capsule (100 mg total) by mouth 2 (two) times daily as needed for mild constipation., Disp: 30 capsule, Rfl: 0 .  fluticasone (FLONASE) 50 MCG/ACT nasal spray, Place 2 sprays into both nostrils daily as needed for allergies or rhinitis., Disp: 48 g, Rfl: 1 .  Fluticasone-Salmeterol (ADVAIR DISKUS) 250-50 MCG/DOSE AEPB, Inhale 1 puff into the  lungs 2 (two) times daily., Disp: 180 each, Rfl: 3 .  glucose blood (ACCU-CHEK GUIDE) test strip, To check blood sugar before meals and bedtime, Disp: 400 each, Rfl: 12 .  hydrALAZINE (APRESOLINE) 100 MG tablet, TAKE (1) TABLET BY MOUTH THREE TIMES DAILY, Disp: 90 tablet, Rfl: 1 .  HYDROcodone-acetaminophen (NORCO/VICODIN) 5-325 MG tablet, Take 1 tablet by mouth every 8 (eight) hours as needed for moderate pain., Disp: 15 tablet, Rfl: 0 .  hydrOXYzine (ATARAX/VISTARIL) 50 MG tablet, Take 1 tablet (50 mg total) by mouth 2 (two) times daily as needed., Disp: 180 tablet, Rfl: 1 .  insulin aspart (NOVOLOG FLEXPEN) 100 UNIT/ML FlexPen, Inject 25 Units into the skin 3 (three) times daily with meals., Disp: 15 mL, Rfl: 11 .  Insulin Pen Needle (PEN NEEDLES) 30G X 8 MM MISC, 1 each by Does not apply route 3 (three) times daily before meals. AC for novolog injections, Disp: 100 each, Rfl: 11 .  isosorbide mononitrate (IMDUR) 30 MG 24 hr tablet, TAKE (3) TABLETS BY MOUTH ONCE DAILY., Disp: 90 tablet, Rfl: 3 .  Lancet Devices (ACCU-CHEK SOFTCLIX) lancets, Use as instructed daily., Disp: 1 each, Rfl: 5 .  LANTUS SOLOSTAR 100 UNIT/ML Solostar Pen, INJECT 60 UNITS S.Q. TWICE DAILY. (Patient taking differently: Inject 65 Units into the skin 2 (two) times daily. ), Disp: 24 mL, Rfl: 11 .  liraglutide (VICTOZA) 18 MG/3ML SOPN, Start with 0.6mg  daily and increase by 0.6mg  per week until max dose of 1.8 mg dose achieved., Disp: 1  pen, Rfl: 6 .  loratadine (CLARITIN) 10 MG tablet, TAKE 1 TABLET BY MOUTH ONCE DAILY., Disp: 90 tablet, Rfl: 1 .  montelukast (SINGULAIR) 10 MG tablet, TAKE ONE TABLET BY MOUTH AT BEDTIME., Disp: 90 tablet, Rfl: 1 .  nitroGLYCERIN (NITROSTAT) 0.4 MG SL tablet, Place 1 tablet (0.4 mg total) under the tongue every 5 (five) minutes x 3 doses as needed for chest pain., Disp: 25 tablet, Rfl: 12 .  omeprazole (PRILOSEC) 20 MG capsule, Take 1 capsule (20 mg total) by mouth daily., Disp: 90 capsule,  Rfl: 3 .  pregabalin (LYRICA) 75 MG capsule, Take 1 capsule (75 mg total) by mouth 2 (two) times daily., Disp: 180 capsule, Rfl: 1 .  PROAIR HFA 108 (90 Base) MCG/ACT inhaler, INHALE 2 PUFFS BY MOUTH EVERY 4 HOURS AS NEEDED FOR SHORTNESS OF BREATH, Disp: 8.5 g, Rfl: 5 .  SENNA PLUS 8.6-50 MG tablet, TAKE ONE TABLET BY MOUTH AT BEDTIME., Disp: 90 tablet, Rfl: 1 .  ticagrelor (BRILINTA) 90 MG TABS tablet, Take 1 tablet (90 mg total) by mouth 2 (two) times daily., Disp: 180 tablet, Rfl: 3 .  torsemide (DEMADEX) 20 MG tablet, TAKE (2) TABLETS BY MOUTH TWICE DAILY., Disp: 120 tablet, Rfl: 0 .  triamcinolone cream (KENALOG) 0.1 %, APPLY TO AFFECTED AREA TWICE DAILY, Disp: 80 g, Rfl: 0 .  valACYclovir (VALTREX) 500 MG tablet, TAKE (1) TABLET BY MOUTH EVERY OTHER DAY., Disp: 45 tablet, Rfl: 1 .  Vitamin D, Ergocalciferol, (DRISDOL) 50000 units CAPS capsule, TAKE 1 CAPSULE BY MOUTH ONCE A MONTH, Disp: 3 capsule, Rfl: 3 .  FEROSUL 325 (65 Fe) MG tablet, TAKE (1) TABLET BY MOUTH EVERY OTHER DAY. (Patient not taking: Reported on 06/01/2018), Disp: 45 tablet, Rfl: 1 .  potassium chloride SA (K-DUR,KLOR-CON) 20 MEQ tablet, TAKE 1 TABLET BY MOUTH TWICE DAILY (Patient not taking: Reported on 06/01/2018), Disp: 180 tablet, Rfl: 1  Review of Systems  Constitutional: Negative.   Respiratory: Negative for cough, chest tightness and shortness of breath.   Cardiovascular: Negative for chest pain, palpitations and leg swelling.  Gastrointestinal: Negative for abdominal pain.  Skin:       Itching    Social History   Tobacco Use  . Smoking status: Former Smoker    Types: Cigarettes    Last attempt to quit: 10/25/1980    Years since quitting: 37.6  . Smokeless tobacco: Never Used  . Tobacco comment: quit smoking in 1982  Substance Use Topics  . Alcohol use: Yes    Comment: rarely   Objective:   BP 138/60 (BP Location: Left Arm, Patient Position: Sitting, Cuff Size: Large)   Pulse 92   Temp 98 F (36.7 C)  (Oral)   Resp 16   Wt 295 lb 3.2 oz (133.9 kg)   SpO2 96%   BMI 44.89 kg/m  Vitals:   06/01/18 1100  BP: 138/60  Pulse: 92  Resp: 16  Temp: 98 F (36.7 C)  TempSrc: Oral  SpO2: 96%  Weight: 295 lb 3.2 oz (133.9 kg)     Physical Exam  Constitutional: She appears well-developed and well-nourished. No distress.  Neck: Normal range of motion. Neck supple.  Cardiovascular: Normal rate and regular rhythm. Exam reveals no gallop and no friction rub.  Murmur heard. Pulmonary/Chest: Effort normal and breath sounds normal. No respiratory distress. She has no wheezes. She has no rales.  Skin: She is not diaphoretic.  Vitals reviewed.       Assessment & Plan:  1. ESRD on dialysis [facility]) Dialyzing on Tu, Th, Sa from permacath in right chest wall. She was seen by Vascular surgery today to schedule permanent dialysis access via fistula. She reports overall she is doing better, just getting fatigued after dialysis.   2. Diabetes mellitus type 2, uncontrolled, with complications ([facility]) W2H down to 9.3 now. Continue medications as prescribed. Has an appt with endocrinology on 06/08/18. May proceed with eye surgery.  - POCT HgB A1C  3. Itching Reports continued itching. Will increase hydroxyzine as below to see if this helps symptoms. Discussed keeping skin moisturized and avoid fragrant soaps that may exacerbate the itching. Drowsiness side effects warned. Do not drive while taking hydroxyzine.  - hydrOXYzine (VISTARIL) 100 MG capsule; Take 1 capsule (100 mg total) by mouth 2 (two) times daily as needed for itching.  Dispense: 180 capsule; Refill: 0       [name], PA-C  [facility]

## 2018-06-03 ENCOUNTER — Telehealth: Payer: Self-pay

## 2018-06-03 DIAGNOSIS — J3489 Other specified disorders of nose and nasal sinuses: Secondary | ICD-10-CM

## 2018-06-03 MED ORDER — MUPIROCIN CALCIUM 2 % NA OINT: 1.0000 "application " | TOPICAL_OINTMENT | Freq: Two times a day (BID) | NASAL | 0 refills | Status: DC

## 2018-06-03 NOTE — Telephone Encounter (Signed)
Yes that would be fine. I would consider discontinuation of brilinta prior to fistula surgery since she is well over 12 months out from PCI. thx

## 2018-06-03 NOTE — Telephone Encounter (Signed)
Patient called saying that she has sores in her nose that are very painful. She is requesting that [name] sends in an ointment to help with this. Patient uses [employer]. Contact info is correct. Thanks!

## 2018-06-03 NOTE — Telephone Encounter (Signed)
bactroban sent in

## 2018-06-03 NOTE — Telephone Encounter (Signed)
1st Attempt : Left messaging instructing pt to call the preop pool back to schedule an appointment for surgical clearance.

## 2018-06-03 NOTE — Telephone Encounter (Signed)
   Primary Calais, MD  Chart reviewed as part of pre-operative protocol coverage. Because of [name]'s past medical history and time since last visit, he/she will require a follow-up visit in order to better assess preoperative cardiovascular risk.  Pre-op covering staff: - Please schedule appointment and call patient to inform them. - Please contact requesting surgeon's office via preferred method (i.e, phone, fax) to inform them of need for appointment prior to surgery.  If applicable, this message will also be routed to pharmacy pool and/or primary cardiologist for input on holding anticoagulant/antiplatelet agent as requested below so that this information is available at time of patient's appointment.   Daune Perch, NP  06/03/2018, 8:43 AM

## 2018-06-03 NOTE — Telephone Encounter (Signed)
Dr. Burt Knack, this pt needs a dialysis fistula. There is no specific request to hold Brillinta, but if needed would you approve. Her last cath in 04/2017 showed patent mid LAD stent (3 X 32 placed in 09/2016).  We will bring her in for visit with APP to clear her.   Please route response back to P [facility] PREOP  Thank you

## 2018-06-04 ENCOUNTER — Encounter: Payer: Self-pay | Admitting: Physician Assistant

## 2018-06-06 ENCOUNTER — Encounter: Payer: Self-pay | Admitting: Physician Assistant

## 2018-06-06 NOTE — Telephone Encounter (Signed)
Spoke with pt and scheduled appointment with Richardson Dopp, PA-C 12/9 @ 11:15 am. Pt verbalized understanding. Pre-op clearance notes have been faxed over to requesting surgeon's office.

## 2018-06-17 ENCOUNTER — Other Ambulatory Visit: Payer: Self-pay | Admitting: Physician Assistant

## 2018-06-17 DIAGNOSIS — L299 Pruritus, unspecified: Secondary | ICD-10-CM

## 2018-06-17 DIAGNOSIS — J3489 Other specified disorders of nose and nasal sinuses: Secondary | ICD-10-CM

## 2018-06-20 ENCOUNTER — Ambulatory Visit: Payer: Medicaid Other | Admitting: Physician Assistant

## 2018-06-20 NOTE — Progress Notes (Deleted)
Cardiology Office Note:    Date:  06/20/2018   ID:  [name], DOB Oct 03, 1955, MRN [id number]  PCP:  [name], PA-C  Cardiologist:  Sherren Mocha, MD *** Electrophysiologist:  None   Referring MD: Florian Buff*   No chief complaint on file. ***  History of Present Illness:    [name] is a 62 y.o. female with CAD, combined systolic and diastolic HF, CKD, diabetes, hypertension, hyperlipidemia, OSA, HCV. She suffered a non-STEMI in 8/67 complicated by acute heart failure. She ultimately underwent DES to the LAD.  Follow up Echo in 8/18 demonstrated normal LVF.  Last seen in clinic in 07/2017.  She has had multiple admissions for heart failure and was started on hemodialysis last month.  ***  [name] ***  Prior CV studies:   The following studies were reviewed today:  Echo 09/15/2017 Mild LVH, EF 65-70, normal wall motion, grade 1 diastolic dysfunction, moderate aortic stenosis (mean 20), trivial AI, mildly dilated ascending aorta, MAC, mild LAE, normal RVSF, trivial pericardial effusion posterior to the heart  Cardiac catheterization 04/29/2017 LAD proximal stent patent, mid 40, distal 10; D2 ostial 60 LCx mid 20, OM1 60, OM2 50    Echocardiogram 03/10/17 EF 60-65, normal wall motion, grade 1 diastolic dysfunction, calcified aortic valve leaflets, mild aortic stenosis (mean 13, peak 28, LVOT/AV mean velocity 0.51), mild AI, moderate MAC, mild LAE, trivial TR  Percutaneous Coronary Intervention 09/18/16 LAD proximal 95, mid 40, distal 50 PCI:  3 x 32 mm Synergy DES to LAD Post-Intervention Diagram    Cardiac Catheterization 09/16/16 Proximal 95, mid 40, distal 10 LCx mid 20, OM1 50, OM2 50  Echocardiogram 09/14/16 Septal and apical HK, EF 45-50, normal wall motion, trivial AI, mild LAE, PASP 38  Renal artery [place] 09/14/16 Summary: Findings suggest 1-59% right renal artery stenosis. Unable to adequately evaluate the left renal artery due to  technical Limitations.  Echocardiogram 09/04/16 EF 55-60, normal wall motion, grade 2 diastolic dysfunction, trivial AI  Past Medical History:  Diagnosis Date  . Anemia   . Aortic stenosis    Echo 8/18: mean 13, peak 28, LVOT/AV mean velocity 0.51  . Arthritis   . Asthma    As a child   . Bronchitis   . CAD (coronary artery disease)    a. 09/2016: 50% Ost 1st Mrg stenosis, 50% 2nd Mrg stenosis, 20% Mid-Cx, 95% Prox LAD, 40% mid-LAD, and 10% dist-LAD stenosis. Staged PCI with DES to Prox-LAD.   [name] Chronic combined systolic and diastolic CHF (congestive heart failure) ([place]) 2011   echo 2/18: EF 55-60, normal wall motion, grade 2 diastolic dysfunction, trivial AI // echo 3/18: Septal and apical HK, EF 45-50, normal wall motion, trivial AI, mild LAE, PASP 38 // echo 8/18: EF 60-65, normal wall motion, grade 1 diastolic dysfunction, calcified aortic valve leaflets, mild aortic stenosis (mean 13, peak 28, LVOT/AV mean velocity 0.51), mild AI, moderate MAC, mild LAE, trivial TR   . Chronic kidney disease    STAGE 4  . Depression   . Diabetes mellitus Dx 1989  . GERD (gastroesophageal reflux disease)   . Gout   . Hepatitis C Dx 2013  . Hypertension Dx 1989  . Myocardial infarction ([place]) 07/2015  . Obesity   . Obstructive sleep apnea   . Pancreatitis 2013  . Pneumonia   . Refusal of blood transfusions as patient is Jehovah's Witness   . Tendinitis   . Tremors of nervous  system    LEFT HAND  . Ulcer 2010   Surgical Hx: The patient  has a past surgical history that includes Tubal ligation; Cholecystectomy; Knee arthroscopy; Tubal ligation; Eye surgery; LEFT HEART CATH AND CORONARY ANGIOGRAPHY (N/A, 09/16/2016); CORONARY STENT INTERVENTION (N/A, 09/18/2016); Left Heart Cath (N/A, 09/18/2016); Incision and drainage abscess (Left, 08/12/2017); Application if wound vac (Left, 08/14/2017); Dressing change under anesthesia (Left, 08/15/2017); LEFT HEART CATH AND CORONARY ANGIOGRAPHY (N/A, 04/29/2017);  Esophagogastroduodenoscopy (egd) with propofol (N/A, 02/03/2018); Colonoscopy with propofol (N/A, 02/03/2018); Coronary angioplasty (07/2015); Lower Extremity Angiography (Right, 03/08/2018); and DIALYSIS/PERMA CATHETER INSERTION (N/A, 05/10/2018).   Current Medications: No outpatient medications have been marked as taking for the 06/20/18 encounter (Appointment) with Richardson Dopp T, PA-C.     Allergies:   Shellfish allergy; Diazepam; and Morphine and related   Social History   Tobacco Use  . Smoking status: Former Smoker    Types: Cigarettes    Last attempt to quit: 10/25/1980    Years since quitting: 37.6  . Smokeless tobacco: Never Used  . Tobacco comment: quit smoking in 1982  Substance Use Topics  . Alcohol use: Yes    Comment: rarely  . Drug use: Not Currently    Types: "Crack" cocaine    Comment: 08/21/2016 "clean since 05/1998"     Family Hx: The patient's family history includes Colon cancer in her mother; Diabetes in her paternal grandmother; Heart attack in her maternal grandmother and other; Hypertension in her brother and sister. There is no history of Breast cancer.  ROS:   Please see the history of present illness.    ROS All other systems reviewed and are negative.   EKGs/Labs/Other Test Reviewed:    EKG:  EKG is *** ordered today.  The ekg ordered today demonstrates ***  Recent Labs: 08/05/2017: NT-Pro BNP 401 10/03/2017: B Natriuretic Peptide 147.0 12/25/2017: TSH 1.577 05/09/2018: ALT 19 05/11/2018: Magnesium 2.1 05/13/2018: BUN 75; Creatinine, Ser 2.28; Hemoglobin 8.1; Platelets 201; Potassium 3.6; Sodium 135   Recent Lipid Panel Lab Results  Component Value Date/Time   CHOL 122 09/13/2016 12:45 PM   TRIG 168 (H) 09/13/2016 12:45 PM   HDL 36 (L) 09/13/2016 12:45 PM   CHOLHDL 3.4 09/13/2016 12:45 PM   LDLCALC 52 09/13/2016 12:45 PM    Physical Exam:    VS:  There were no vitals taken for this visit.    Wt Readings from Last 3 Encounters:  06/01/18  295 lb 3.2 oz (133.9 kg)  06/01/18 293 lb 6.4 oz (133.1 kg)  05/14/18 288 lb 12.8 oz (131 kg)     ***Physical Exam  ASSESSMENT & PLAN:    No diagnosis found.*** 1. Coronary artery disease involving native coronary artery of native heart with unstable angina pectoris ([place]) She presents with symptoms of chest pain and worsening shortness of breath. Her symptoms are concerning for unstable angina. Her ECG is not acute.  She is visibly uncomfortable in the exam room.  I have recommended admission to the hospital. I discussed with Dr. Liam Rogers who agreed.               -  Admit to [facility] (going to ED as there are no beds; via EMS)             -  Check serial Troponins             -  CMET, CBC, BNP, CXR             -  Add IV NTG, IV Heparin              -  Continue ASA, Brilinta, statin, beta-blocker             -  Suspect she will need Cardiac Catheterization at some point this admit             -  Will try IV diuresis initially prior to proceeding with cath             -  Will also obtain VQ scan to r/o pulmonary embolism   2. Acute on chronic combined systolic (congestive) and diastolic (congestive) heart failure ([place]) She seems to be volume overloaded as well.  Her weight is down 7 lbs since last seen.  Her exam is difficult and her lungs are clear.  But, overall, her exam does suggest volume excess.               -  Continue beta-blocker, hydralazine, nitrates.               -  Will give Lasix 80 mg IV x 1             -  Reconsider Lasix dose/route in AM             -  BMET in AM  3. Nonrheumatic aortic valve stenosis Mild by recent echo.  4. Essential hypertension The patient's blood pressure is controlled on her current regimen.     5. CKD (chronic kidney disease) stage 4, GFR 15-29 ml/min (HCC) She is due to see Nephrology next week.  We may need to consult Nephrology during this admit.  As noted, she will need Cardiac Catheterization to evaluate her chest pain.  She is high  risk for contrast induced nephropathy.  Hold ACE inhibitor.   Dispo:  No follow-ups on file.   Medication Adjustments/Labs and Tests Ordered: Current medicines are reviewed at length with the patient today.  Concerns regarding medicines are outlined above.  Tests Ordered: No orders of the defined types were placed in this encounter.  Medication Changes: No orders of the defined types were placed in this encounter.   Signed, Richardson Dopp, PA-C  06/20/2018 9:11 AM    [facility] [address], [place], [place]  [address] Phone: [phone]; Fax: [phone]

## 2018-06-21 ENCOUNTER — Encounter: Payer: Self-pay | Admitting: Physician Assistant

## 2018-06-24 ENCOUNTER — Other Ambulatory Visit: Payer: Self-pay | Admitting: Physician Assistant

## 2018-06-24 ENCOUNTER — Encounter: Payer: Self-pay | Admitting: Physician Assistant

## 2018-06-24 DIAGNOSIS — I5032 Chronic diastolic (congestive) heart failure: Secondary | ICD-10-CM

## 2018-06-24 DIAGNOSIS — I10 Essential (primary) hypertension: Secondary | ICD-10-CM

## 2018-06-24 DIAGNOSIS — M1A39X0 Chronic gout due to renal impairment, multiple sites, without tophus (tophi): Secondary | ICD-10-CM

## 2018-06-24 DIAGNOSIS — J4530 Mild persistent asthma, uncomplicated: Secondary | ICD-10-CM

## 2018-06-24 DIAGNOSIS — L299 Pruritus, unspecified: Secondary | ICD-10-CM

## 2018-06-24 MED ORDER — MOMETASONE FUROATE 0.1 % EX CREA: 1.0000 "application " | TOPICAL_CREAM | Freq: Every day | CUTANEOUS | 0 refills | Status: DC

## 2018-06-24 MED ORDER — ALBUTEROL SULFATE HFA 108 (90 BASE) MCG/ACT IN AERS: INHALATION_SPRAY | RESPIRATORY_TRACT | 5 refills | Status: DC

## 2018-06-24 MED ORDER — FLUTICASONE-SALMETEROL 250-50 MCG/DOSE IN AEPB: 1.0000 | INHALATION_SPRAY | Freq: Two times a day (BID) | RESPIRATORY_TRACT | 3 refills | Status: DC

## 2018-06-30 ENCOUNTER — Encounter: Payer: Self-pay | Admitting: Physician Assistant

## 2018-07-11 ENCOUNTER — Telehealth: Payer: Self-pay | Admitting: Cardiovascular Disease

## 2018-07-11 NOTE — Telephone Encounter (Signed)
Spoke to patient and arranged a Clearance appt with Cecilie Kicks 07/28/18 @ 9:00.  She verbalized understanding.

## 2018-07-11 NOTE — Telephone Encounter (Signed)
New Message   Pt is returning call that she received today. Please call to discuss

## 2018-07-11 NOTE — Telephone Encounter (Signed)
Left message for pt to call back.  Pt needs an appt with APP to get cleared for surgery.

## 2018-07-14 DIAGNOSIS — D509 Iron deficiency anemia, unspecified: Secondary | ICD-10-CM | POA: Diagnosis not present

## 2018-07-14 DIAGNOSIS — Z23 Encounter for immunization: Secondary | ICD-10-CM | POA: Diagnosis not present

## 2018-07-14 DIAGNOSIS — D631 Anemia in chronic kidney disease: Secondary | ICD-10-CM | POA: Diagnosis not present

## 2018-07-14 DIAGNOSIS — N186 End stage renal disease: Secondary | ICD-10-CM | POA: Diagnosis not present

## 2018-07-14 DIAGNOSIS — N2581 Secondary hyperparathyroidism of renal origin: Secondary | ICD-10-CM | POA: Diagnosis not present

## 2018-07-16 DIAGNOSIS — D631 Anemia in chronic kidney disease: Secondary | ICD-10-CM | POA: Diagnosis not present

## 2018-07-16 DIAGNOSIS — Z23 Encounter for immunization: Secondary | ICD-10-CM | POA: Diagnosis not present

## 2018-07-16 DIAGNOSIS — N2581 Secondary hyperparathyroidism of renal origin: Secondary | ICD-10-CM | POA: Diagnosis not present

## 2018-07-16 DIAGNOSIS — D509 Iron deficiency anemia, unspecified: Secondary | ICD-10-CM | POA: Diagnosis not present

## 2018-07-16 DIAGNOSIS — N186 End stage renal disease: Secondary | ICD-10-CM | POA: Diagnosis not present

## 2018-07-19 ENCOUNTER — Other Ambulatory Visit: Payer: Self-pay | Admitting: Physician Assistant

## 2018-07-19 ENCOUNTER — Encounter: Payer: Self-pay | Admitting: Physician Assistant

## 2018-07-19 DIAGNOSIS — N186 End stage renal disease: Secondary | ICD-10-CM | POA: Diagnosis not present

## 2018-07-19 DIAGNOSIS — N2581 Secondary hyperparathyroidism of renal origin: Secondary | ICD-10-CM | POA: Diagnosis not present

## 2018-07-19 DIAGNOSIS — Z23 Encounter for immunization: Secondary | ICD-10-CM | POA: Diagnosis not present

## 2018-07-19 DIAGNOSIS — L299 Pruritus, unspecified: Secondary | ICD-10-CM

## 2018-07-19 DIAGNOSIS — D631 Anemia in chronic kidney disease: Secondary | ICD-10-CM | POA: Diagnosis not present

## 2018-07-19 DIAGNOSIS — D509 Iron deficiency anemia, unspecified: Secondary | ICD-10-CM | POA: Diagnosis not present

## 2018-07-19 DIAGNOSIS — Z1231 Encounter for screening mammogram for malignant neoplasm of breast: Secondary | ICD-10-CM

## 2018-07-20 ENCOUNTER — Other Ambulatory Visit: Payer: Self-pay | Admitting: Physician Assistant

## 2018-07-20 DIAGNOSIS — N186 End stage renal disease: Secondary | ICD-10-CM

## 2018-07-20 DIAGNOSIS — Z794 Long term (current) use of insulin: Secondary | ICD-10-CM

## 2018-07-20 DIAGNOSIS — Z992 Dependence on renal dialysis: Principal | ICD-10-CM

## 2018-07-20 DIAGNOSIS — E1122 Type 2 diabetes mellitus with diabetic chronic kidney disease: Secondary | ICD-10-CM

## 2018-07-20 IMAGING — CR DG FOOT COMPLETE 3+V*R*
3 series · 3 of 3 positions shown · non-contrast
Comparison: None.

CLINICAL DATA: Posterior heel pain, foot gave out while driving
today.

EXAM:
RIGHT FOOT COMPLETE - 3+ VIEW

[x foot ap right]
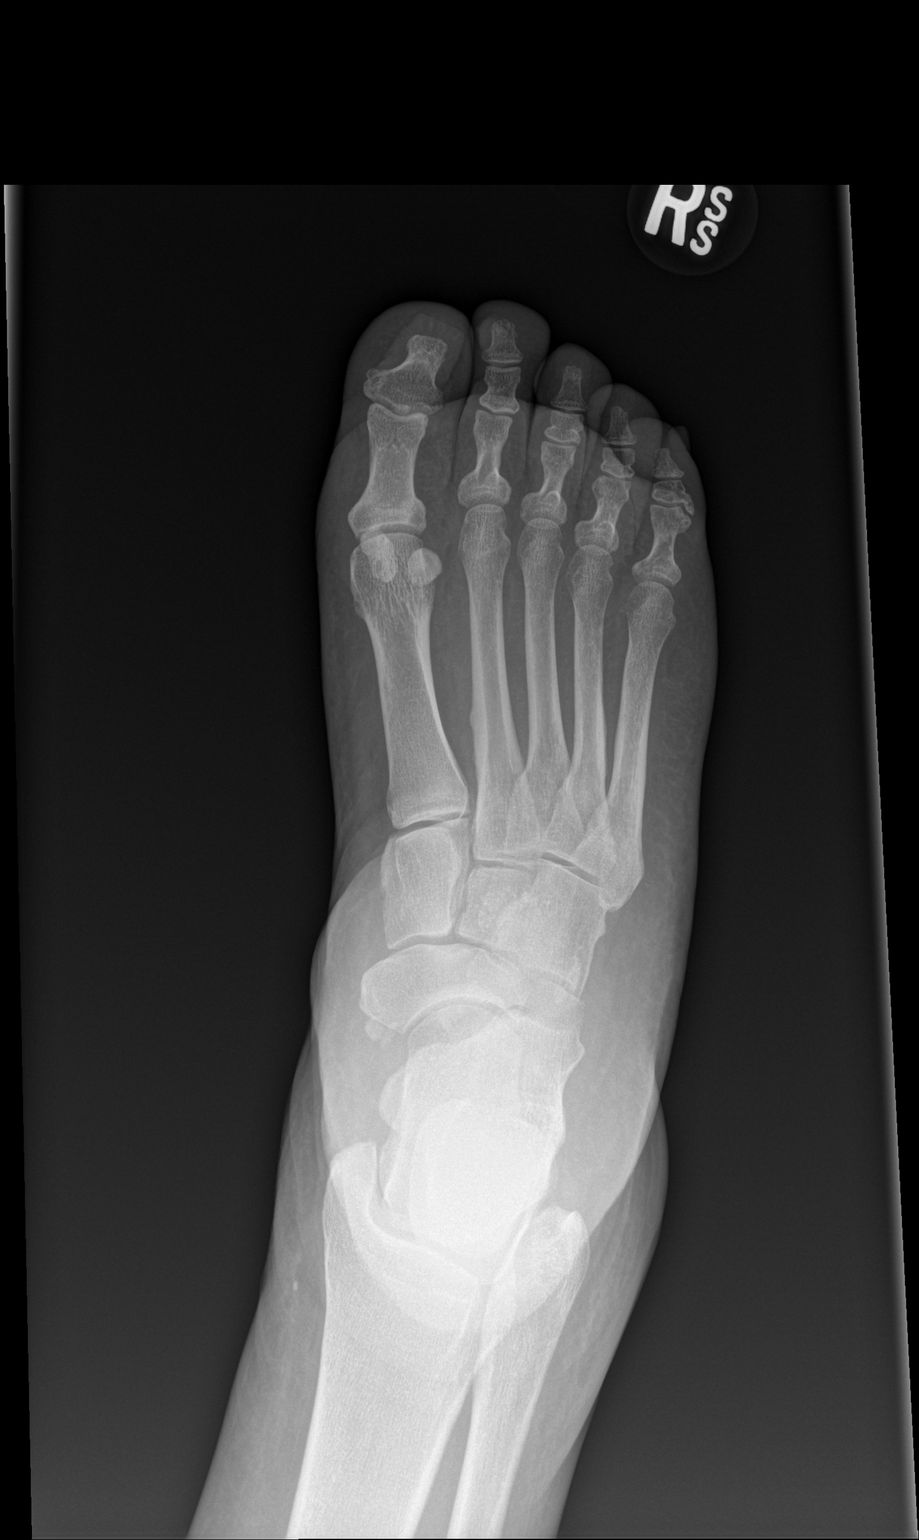

[x foot obl right]
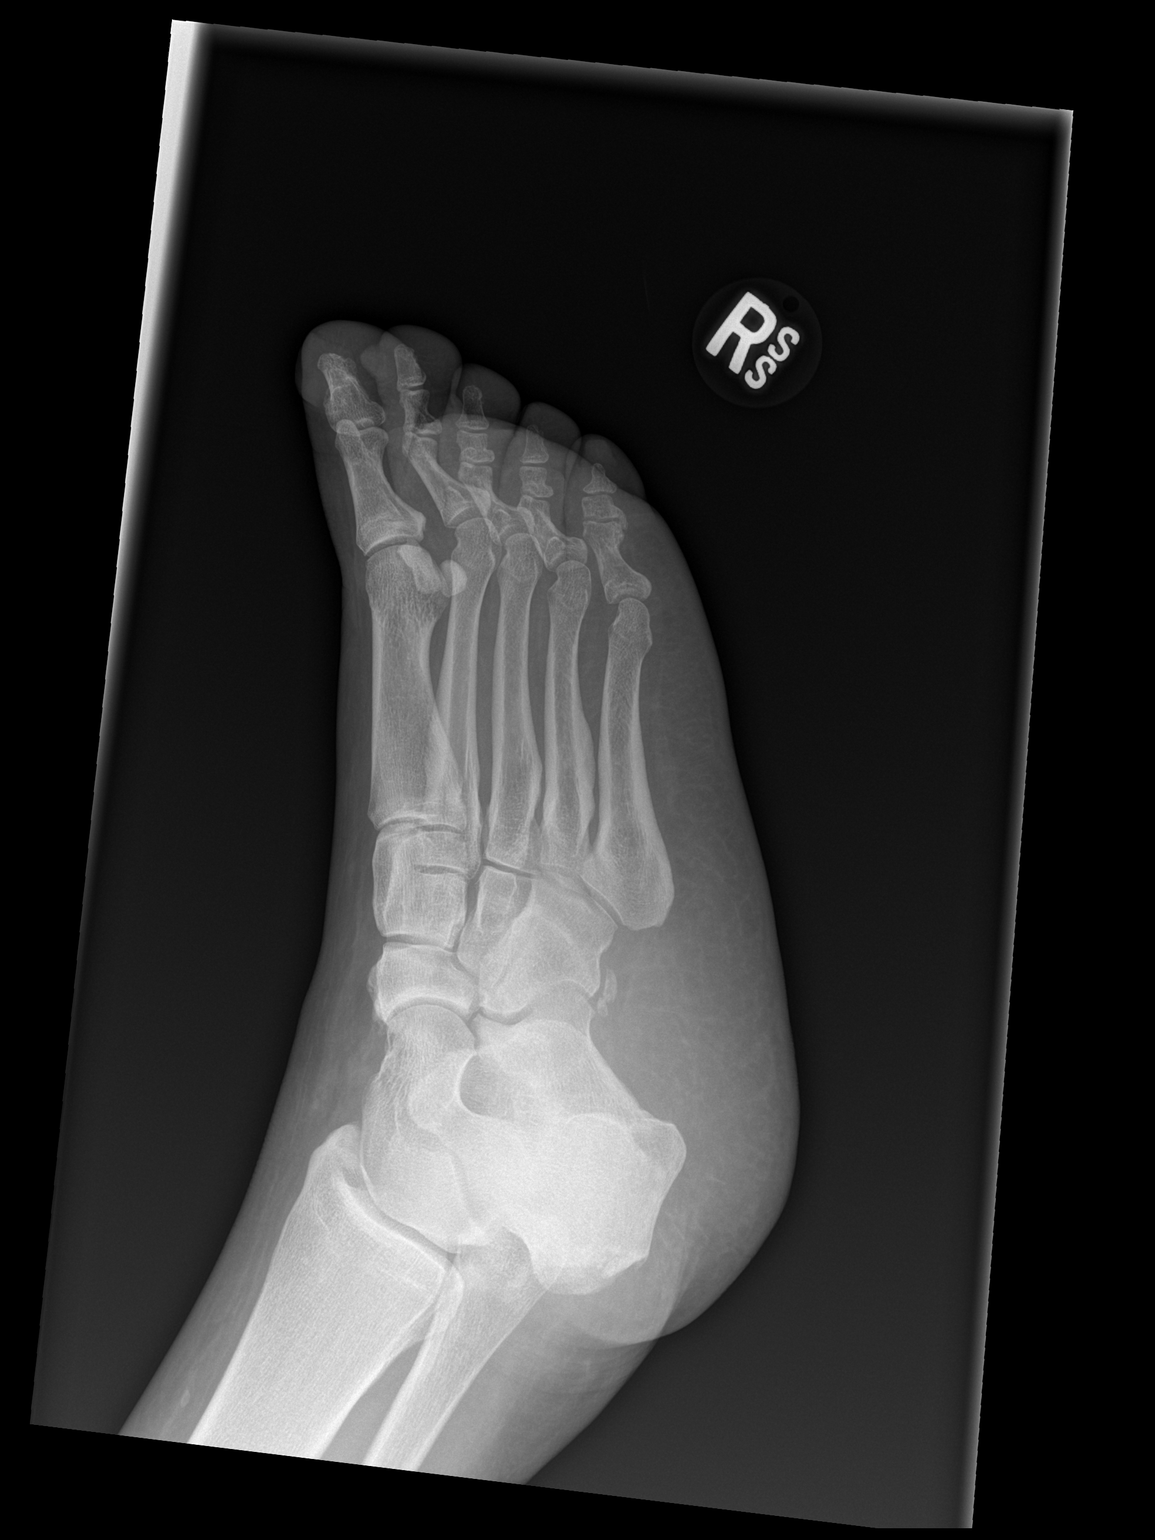

[x foot lat right]
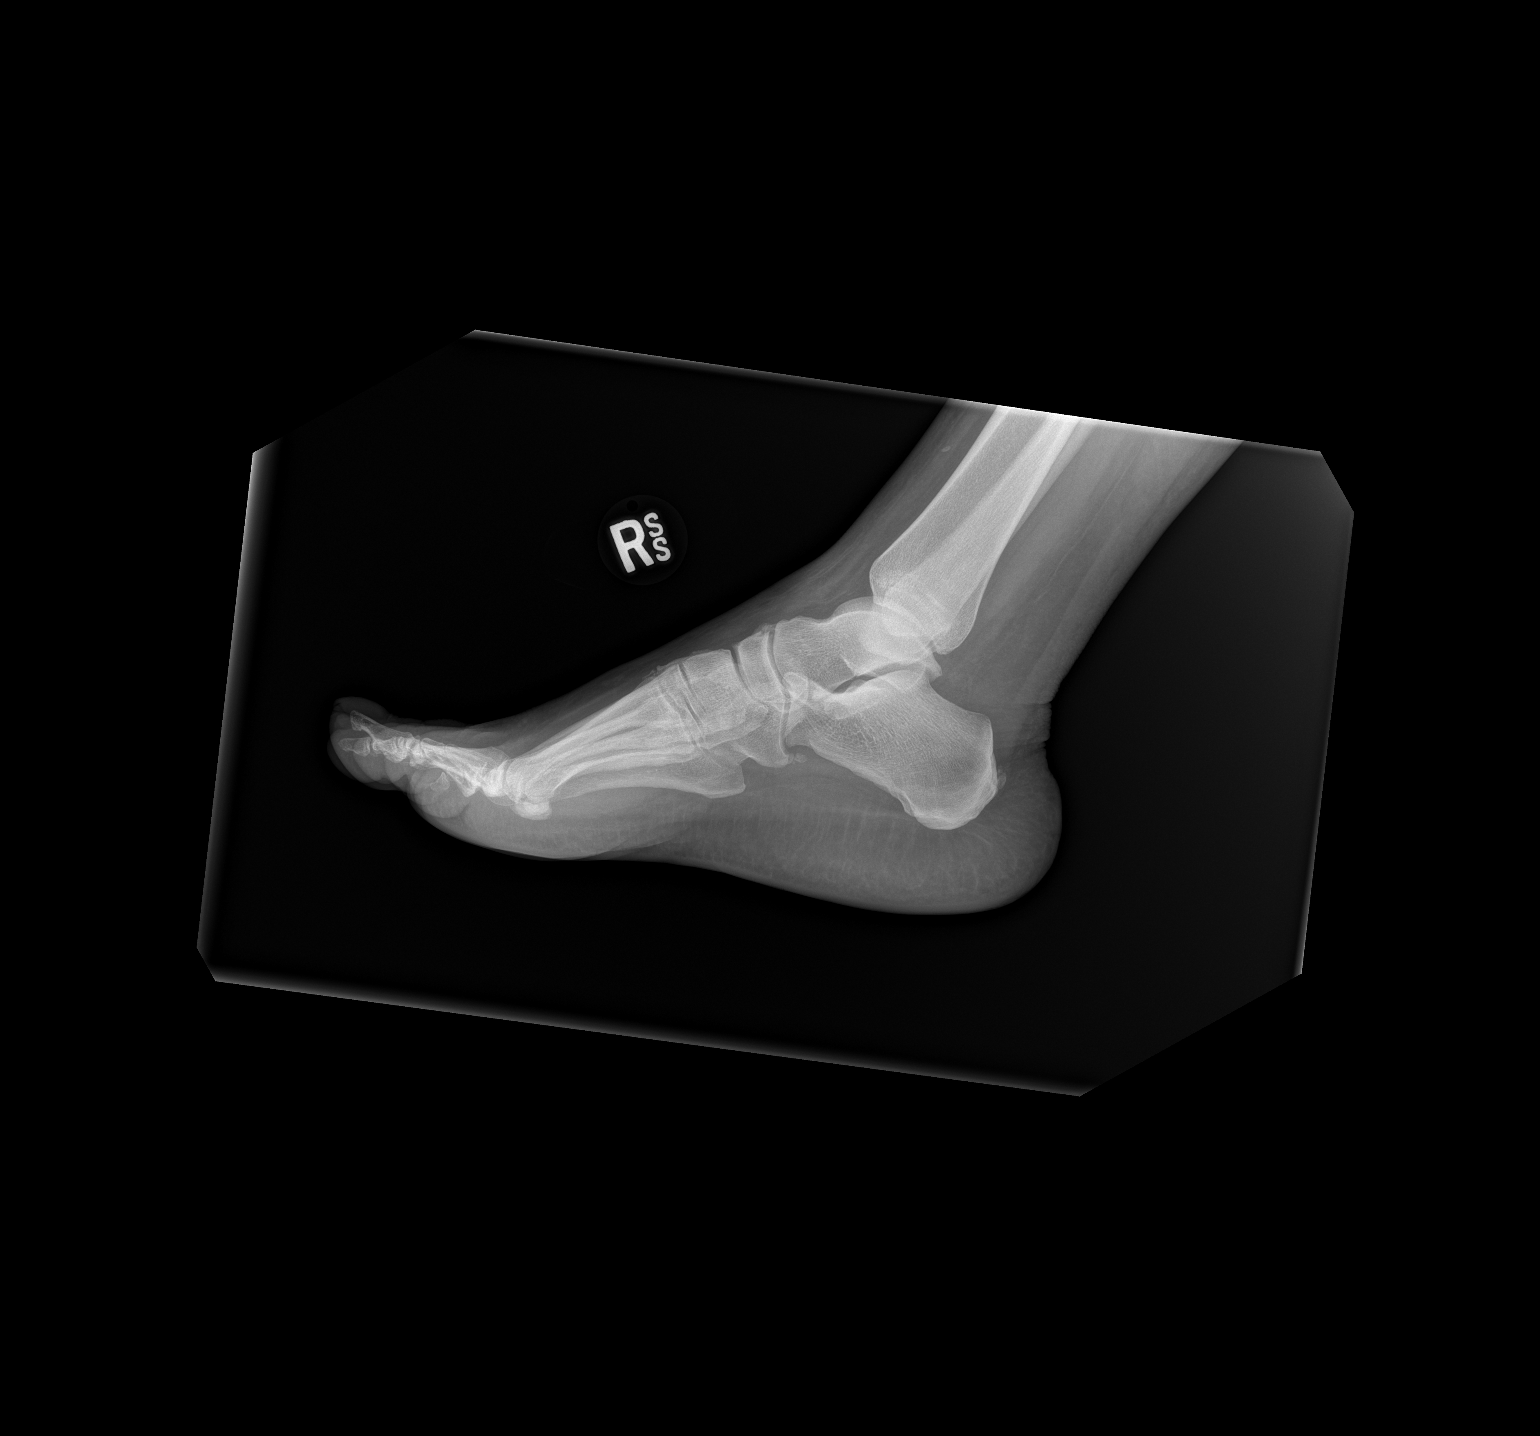

[3 of 3 positions shown; findings below may reference images not displayed]

FINDINGS: No acute fracture deformity or dislocation. Marginal fifth PIP
spurring. Small os perineum. Joint space intact without erosions. No
destructive bony lesions. Soft tissue planes are not suspicious.
IMPRESSION: Negative.

## 2018-07-21 DIAGNOSIS — Z23 Encounter for immunization: Secondary | ICD-10-CM | POA: Diagnosis not present

## 2018-07-21 DIAGNOSIS — D509 Iron deficiency anemia, unspecified: Secondary | ICD-10-CM | POA: Diagnosis not present

## 2018-07-21 DIAGNOSIS — D631 Anemia in chronic kidney disease: Secondary | ICD-10-CM | POA: Diagnosis not present

## 2018-07-21 DIAGNOSIS — N186 End stage renal disease: Secondary | ICD-10-CM | POA: Diagnosis not present

## 2018-07-21 DIAGNOSIS — N2581 Secondary hyperparathyroidism of renal origin: Secondary | ICD-10-CM | POA: Diagnosis not present

## 2018-07-23 ENCOUNTER — Other Ambulatory Visit: Payer: Self-pay | Admitting: Physician Assistant

## 2018-07-23 DIAGNOSIS — I5032 Chronic diastolic (congestive) heart failure: Secondary | ICD-10-CM

## 2018-07-23 DIAGNOSIS — D631 Anemia in chronic kidney disease: Secondary | ICD-10-CM | POA: Diagnosis not present

## 2018-07-23 DIAGNOSIS — I10 Essential (primary) hypertension: Secondary | ICD-10-CM

## 2018-07-23 DIAGNOSIS — N186 End stage renal disease: Secondary | ICD-10-CM | POA: Diagnosis not present

## 2018-07-23 DIAGNOSIS — Z23 Encounter for immunization: Secondary | ICD-10-CM | POA: Diagnosis not present

## 2018-07-23 DIAGNOSIS — N2581 Secondary hyperparathyroidism of renal origin: Secondary | ICD-10-CM | POA: Diagnosis not present

## 2018-07-23 DIAGNOSIS — D509 Iron deficiency anemia, unspecified: Secondary | ICD-10-CM | POA: Diagnosis not present

## 2018-07-23 IMAGING — MR MR [PERSON_NAME]*[PERSON_NAME]* W/O CM
7 series · 40 of 40 positions shown · non-contrast
Comparison: Plain films of the left hand 04/04/2016

CLINICAL DATA: Left hand pain since a twisting injury of opening a
refrigerator door 2 months ago. Pain is centered at the distal thumb
and index finger. Left hand weakness with prolonged disuse.

EXAM:
MRI OF THE LEFT HAND WITHOUT CONTRAST
TECHNIQUE: Multiplanar, multisequence MR imaging was performed. No intravenous
contrast was administered.

[Series 4: T2 fat-sat · axial · left · 3.0mm · 0.38mm/px · z∈[-52,+110]mm · 8 of 50 slices shown (1 of 4)]
[im 1/50]
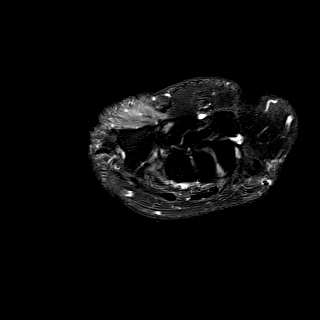
[im 8/50]
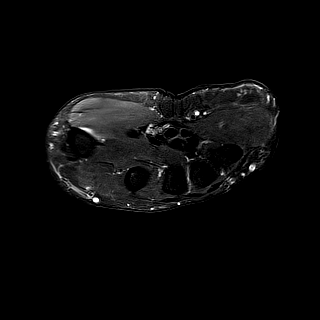
[im 15/50]
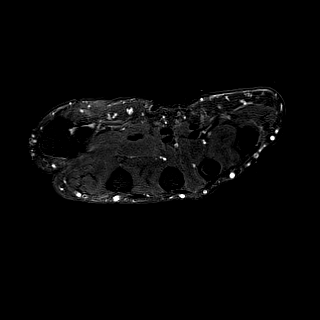
[im 22/50]
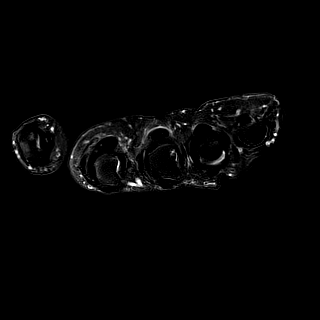
[im 29/50]
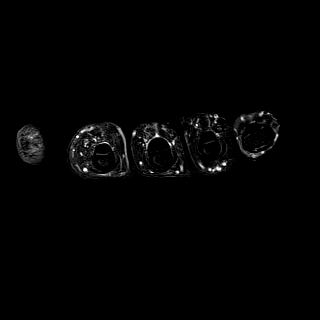
[im 36/50]
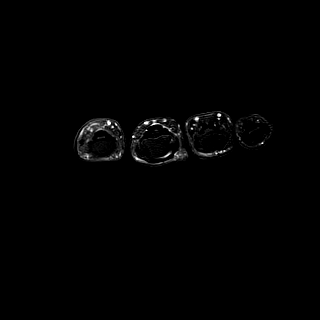
[im 43/50]
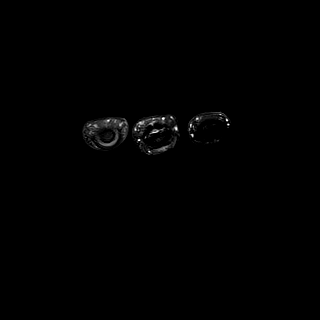
[im 50/50]
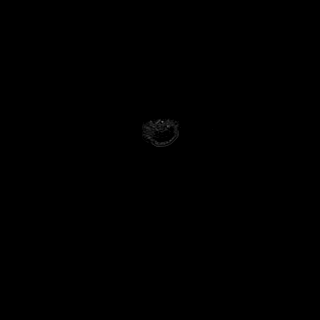

[Series 5: T1 · coronal · left · 3.0mm · 0.49mm/px · 3 of 20 slices shown (1 of 2)]
[im 1/20]
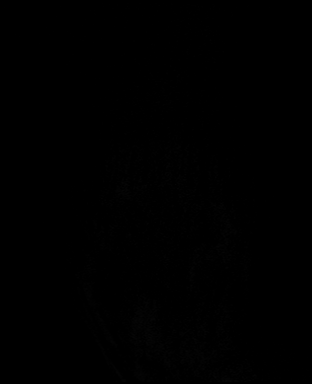
[im 10/20]
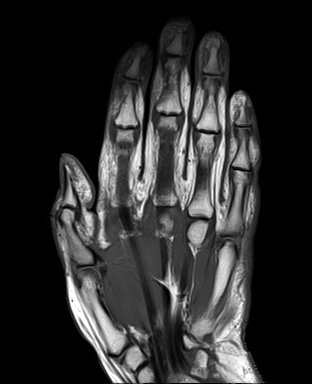
[im 20/20]
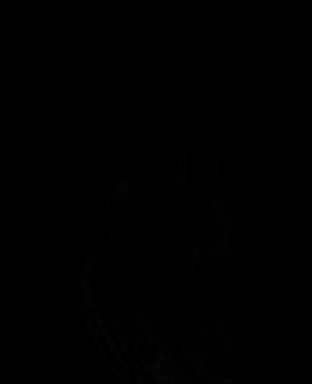

[Series 6: T2 fat-sat · coronal · left · 3.0mm · 0.59mm/px · 3 of 20 slices shown (2 of 4)]
[im 1/20]
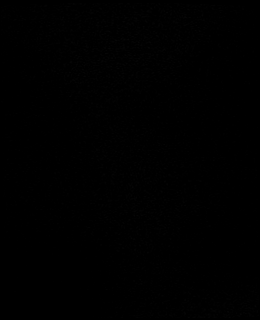
[im 10/20]
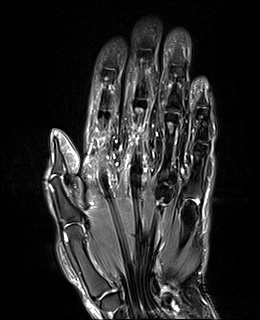
[im 20/20]
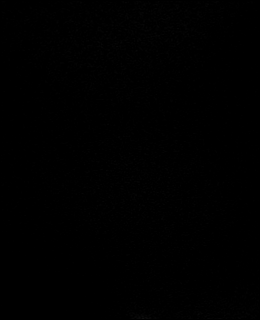

[Series 7: PD fat-sat · coronal · left · 3.0mm · 0.49mm/px · 3 of 20 slices shown]
[im 1/20]
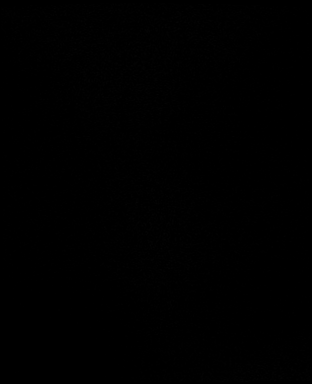
[im 10/20]
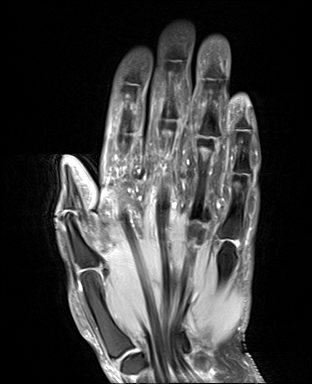
[im 20/20]
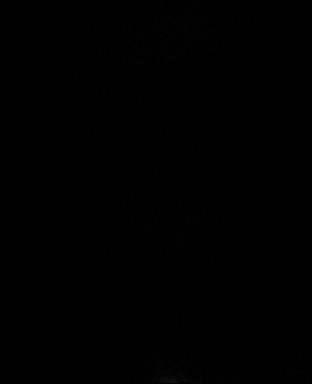

[Series 10: T2 fat-sat · sagittal · left · 3.0mm · 0.59mm/px · 7 of 44 slices shown (3 of 4)]
[im 1/44]
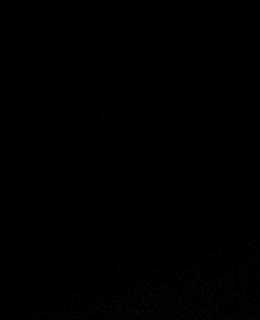
[im 8/44]
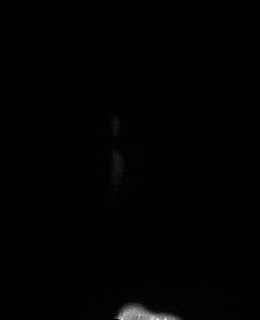
[im 15/44]
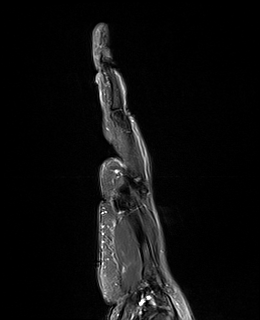
[im 22/44]
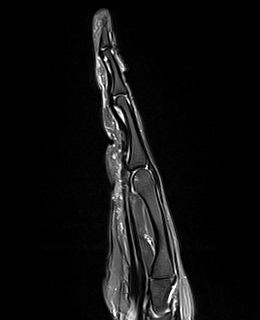
[im 29/44]
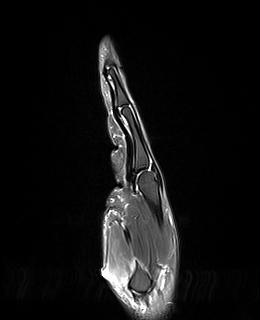
[im 36/44]
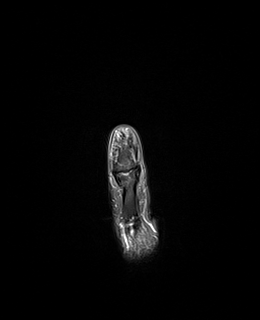
[im 44/44]
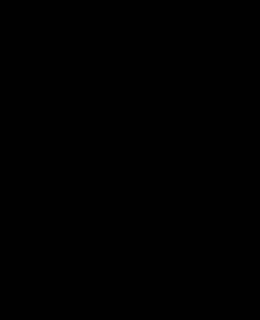

[Series 11: T1 · sagittal · left · 3.0mm · 0.59mm/px · 7 of 44 slices shown (2 of 2)]
[im 1/44]
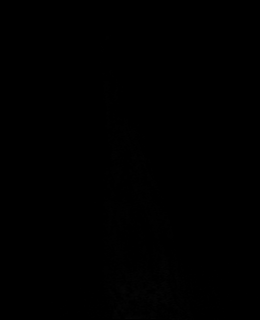
[im 8/44]
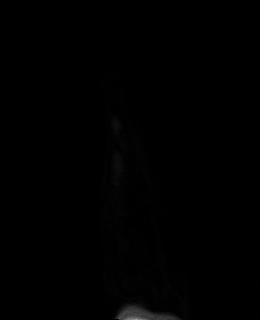
[im 15/44]
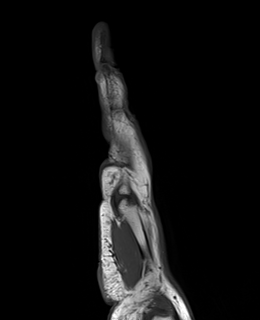
[im 22/44]
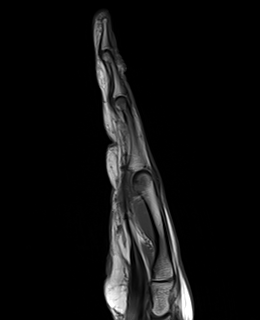
[im 29/44]
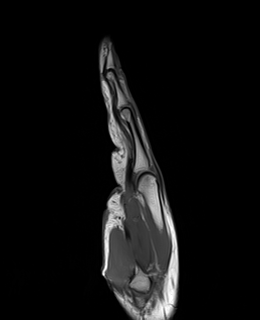
[im 36/44]
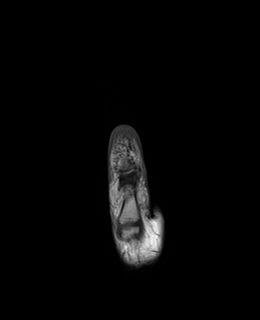
[im 44/44]
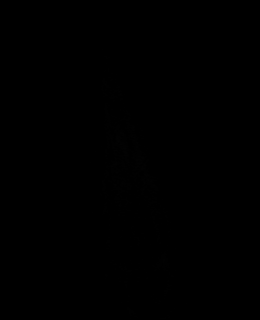

[Series 12: T2 fat-sat · axial · left · 3.0mm · 0.38mm/px · z∈[-78,+119]mm · 9 of 60 slices shown (4 of 4)]
[im 1/60]
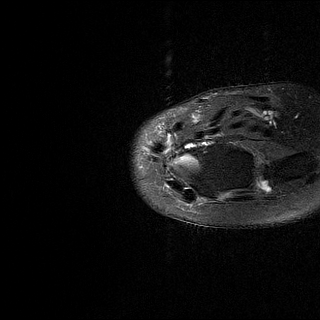
[im 8/60]
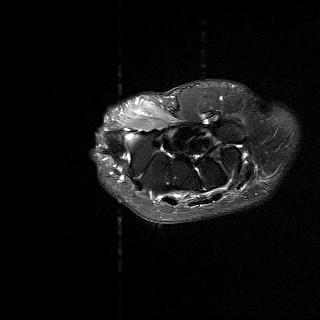
[im 15/60]
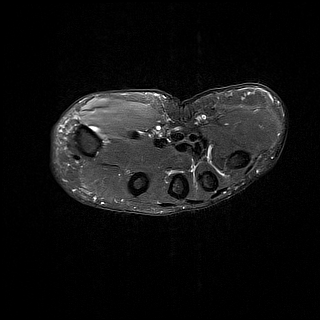
[im 23/60]
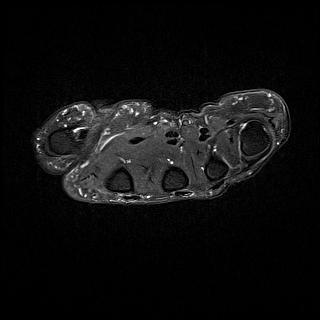
[im 30/60]
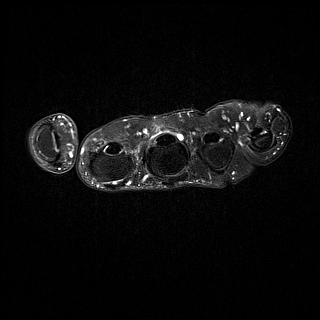
[im 37/60]
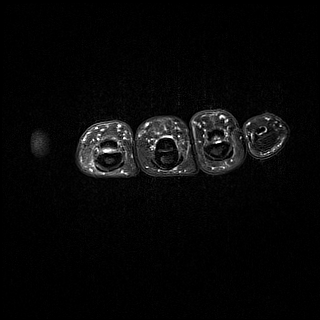
[im 45/60]
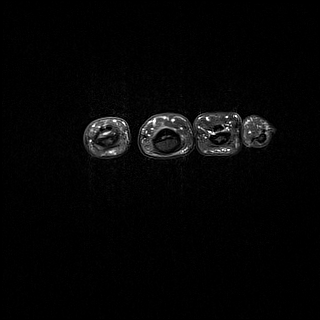
[im 52/60]
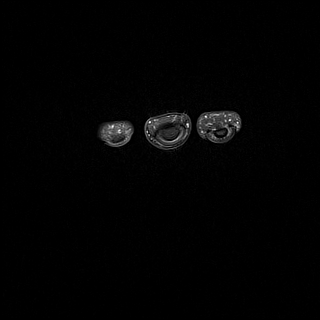
[im 60/60]
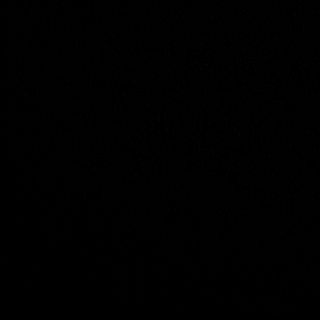

[40 of 40 positions shown; findings below may reference images not displayed]

FINDINGS: Bone marrow signal is normal throughout. The ulnar collateral
ligament of the thumb appears partially torn from the base the
proximal phalanx. Ligaments otherwise unremarkable. Mild edema is
seen in the thenar eminence muscles eccentrically prominent
laterally. No tear or focal fluid collection is identified. No
pulley injury is identified. There is no fluid collection or mass.
IMPRESSION: Findings suggestive of partial tear of the ulnar collateral ligament
of the first MCP joint from the proximal phalanx of the thumb. If
there is clinical concern for tear, MR arthrogram would be useful
for further evaluation.

Mild edema in the muscles of the thenar eminence could be due to
contusion, strain or myositis. No tear.

## 2018-07-25 ENCOUNTER — Other Ambulatory Visit: Payer: Self-pay | Admitting: Physician Assistant

## 2018-07-25 DIAGNOSIS — E1142 Type 2 diabetes mellitus with diabetic polyneuropathy: Secondary | ICD-10-CM

## 2018-07-25 MED ORDER — PREGABALIN 75 MG PO CAPS: 75.0000 mg | ORAL_CAPSULE | Freq: Two times a day (BID) | ORAL | 1 refills | Status: DC

## 2018-07-25 NOTE — Telephone Encounter (Signed)
[facility] faxed refill request for the following medications:  pregabalin (LYRICA) 75 MG capsule  Last dispensed: 06/24/2018  Please advise.

## 2018-07-26 DIAGNOSIS — N2581 Secondary hyperparathyroidism of renal origin: Secondary | ICD-10-CM | POA: Diagnosis not present

## 2018-07-26 DIAGNOSIS — D631 Anemia in chronic kidney disease: Secondary | ICD-10-CM | POA: Diagnosis not present

## 2018-07-26 DIAGNOSIS — D509 Iron deficiency anemia, unspecified: Secondary | ICD-10-CM | POA: Diagnosis not present

## 2018-07-26 DIAGNOSIS — N186 End stage renal disease: Secondary | ICD-10-CM | POA: Diagnosis not present

## 2018-07-26 DIAGNOSIS — Z23 Encounter for immunization: Secondary | ICD-10-CM | POA: Diagnosis not present

## 2018-07-27 NOTE — Progress Notes (Addendum)
Cardiology Office Note   Date:  07/28/2018   ID:  [name], DOB Apr 04, 1956, MRN [id number]  PCP:  [name], PA-C  Cardiologist:  Dr. Burt Knack     Chief Complaint  Patient presents with  . Pre-op Exam      History of Present Illness: [name] is a 63 y.o. female who presents for pre-op eval for AV graft.  By Dr. Delana Meyer.   DM, HTN, hyperlipidemia, CKD, HEP C, OSA, CAD, chronic diastolic CHF and mild AS  Patient's cardiac history dates back to March 2018 when she presented with a non-ST elevation myocardial infarction. Echocardiogram showed ejection fraction 45-50% with septal and apical hypokinesis. She underwent cardiac catheterization and was found to have a 95% proximal LAD and she had PCI with a Synergy drug-eluting stent. Last echocardiogram August 2018 showed normal LV function, grade 1 diastolic dysfunction, mild aortic stenosis with mean gradient 13 mmHg, mild aortic insufficiency and mild left atrial enlargement. She had repeat cardiac catheterization October 2018 because of recurring chest pain. She was found to have a widely patent mid LAD stent. There was mild to moderate disease in her circumflex, obtuse marginal and diagonal. Left ventricular filling pressure was normal. Recommended medical therapy.    Seen in ER 07/15/17 for SOB and weight gain. Given IV lasix and increased lasix for few days. Weight was 323lb.   Here today for follow up. She has lost 21lb since ER visit. Still has SOB with exertion, orthopnea and lower extremity edema.  She denies chest pain, palpitation, dizziness, melena or blood in his stool or urine.  Run out of some of her blood pressure medication week ago. Admitted 10/03/17 due to acute on chronic HF due to noncompliance. Cardiology consult obtained. Initially needed IV lasix and then transitioned to oral diuretics. Nephrology also saw patient and decided she didn't need dialysis at this time. Discharged after 8 days. Admitted  09/14/17 due to acute on chronic HF. Initially needed lasix drip and then transitioned to oral lasix. Cardiology and pulmonology consults were obtained. Had acute kidney injury which did improve. Discussed with nephrology. Discharged after 6 days  [facility]ospital[facility] 05/2018 at [facility] on dialysis.  Needs  Pre-op eval, for AV graft.  Lt arm, has subclavian cath currently on Rt.   Now since stopping imdur she is DOE.  No chest pain but dyspneic.  She can lie flat to sleep without dyspnea.  Her last EKG was without changes.   Her diabetes is improved with Hgb A1C down from 11 to 8.3 now.  She has dialysis and is very tired afterward.  Her cath in subclavian is uncomfortable. Her stent has been in almost 2 years.  discussed with Dr. Burt Knack and ok to stop Brilinta at this time. Continue ASA 81 mg daily.      Past Medical History:  Diagnosis Date  . Anemia   . Aortic stenosis    Echo 8/18: mean 13, peak 28, LVOT/AV mean velocity 0.51  . Arthritis   . Asthma    As a child   . Bronchitis   . CAD (coronary artery disease)    a. 09/2016: 50% Ost 1st Mrg stenosis, 50% 2nd Mrg stenosis, 20% Mid-Cx, 95% Prox LAD, 40% mid-LAD, and 10% dist-LAD stenosis. Staged PCI with DES to Prox-LAD.   [name] Chronic combined systolic and diastolic CHF (congestive heart failure) ([place]) 2011   echo 2/18: EF 55-60, normal wall motion, grade 2 diastolic dysfunction, trivial AI // echo 3/18:  Septal and apical HK, EF 45-50, normal wall motion, trivial AI, mild LAE, PASP 38 // echo 8/18: EF 60-65, normal wall motion, grade 1 diastolic dysfunction, calcified aortic valve leaflets, mild aortic stenosis (mean 13, peak 28, LVOT/AV mean velocity 0.51), mild AI, moderate MAC, mild LAE, trivial TR   . Chronic kidney disease    STAGE 4  . Depression   . Diabetes mellitus Dx 1989  . GERD (gastroesophageal reflux disease)   . Gout   . Hepatitis C Dx 2013  . Hypertension Dx 1989  . Myocardial infarction ([place]) 07/2015  . Obesity   .  Obstructive sleep apnea   . Pancreatitis 2013  . Pneumonia   . Refusal of blood transfusions as patient is Jehovah's Witness   . Tendinitis   . Tremors of nervous system    LEFT HAND  . Ulcer 2010    Past Surgical History:  Procedure Laterality Date  . APPLICATION OF WOUND VAC Left 08/14/2017   Procedure: APPLICATION OF WOUND VAC Exchange;  Surgeon: Robert Bellow, MD;  Location: [facility] ORS;  Service: General;  Laterality: Left;  . CHOLECYSTECTOMY    . COLONOSCOPY WITH PROPOFOL N/A 02/03/2018   Procedure: COLONOSCOPY WITH PROPOFOL;  Surgeon: Lin Landsman, MD;  Location: [facility] ENDOSCOPY;  Service: Gastroenterology;  Laterality: N/A;  . CORONARY ANGIOPLASTY  07/2015   STENT  . CORONARY STENT INTERVENTION N/A 09/18/2016   Procedure: Coronary Stent Intervention;  Surgeon: Troy Sine, MD;  Location: [place] CV LAB;  Service: Cardiovascular;  Laterality: N/A;  . DIALYSIS/PERMA CATHETER INSERTION N/A 05/10/2018   Procedure: DIALYSIS/PERMA CATHETER INSERTION;  Surgeon: Katha Cabal, MD;  Location: [place] CV LAB;  Service: Cardiovascular;  Laterality: N/A;  . DRESSING CHANGE UNDER ANESTHESIA Left 08/15/2017   Procedure: exploration of wound for bleeding;  Surgeon: Robert Bellow, MD;  Location: [facility] ORS;  Service: General;  Laterality: Left;  . ESOPHAGOGASTRODUODENOSCOPY (EGD) WITH PROPOFOL N/A 02/03/2018   Procedure: ESOPHAGOGASTRODUODENOSCOPY (EGD) WITH PROPOFOL;  Surgeon: Lin Landsman, MD;  Location: [facility] ENDOSCOPY;  Service: Gastroenterology;  Laterality: N/A;  . EYE SURGERY    . INCISION AND DRAINAGE ABSCESS Left 08/12/2017   Procedure: INCISION AND DRAINAGE ABSCESS;  Surgeon: Robert Bellow, MD;  Location: [facility] ORS;  Service: General;  Laterality: Left;  . KNEE ARTHROSCOPY    . LEFT HEART CATH N/A 09/18/2016   Procedure: Left Heart Cath;  Surgeon: Troy Sine, MD;  Location: [place] CV LAB;  Service: Cardiovascular;  Laterality: N/A;  . LEFT HEART  CATH AND CORONARY ANGIOGRAPHY N/A 09/16/2016   Procedure: Left Heart Cath and Coronary Angiography;  Surgeon: Burnell Blanks, MD;  Location: [place] CV LAB;  Service: Cardiovascular;  Laterality: N/A;  . LEFT HEART CATH AND CORONARY ANGIOGRAPHY N/A 04/29/2017   Procedure: LEFT HEART CATH AND CORONARY ANGIOGRAPHY;  Surgeon: Nelva Bush, MD;  Location: [place] [facility] LAB;  Service: Cardiovascular;  Laterality: N/A;  . LOWER EXTREMITY ANGIOGRAPHY Right 03/08/2018   Procedure: LOWER EXTREMITY ANGIOGRAPHY;  Surgeon: Katha Cabal, MD;  Location: [place] [facility] LAB;  Service: Cardiovascular;  Laterality: Right;  . TUBAL LIGATION    . TUBAL LIGATION       Current Outpatient Medications  Medication Sig Dispense Refill  . ACCU-CHEK SOFTCLIX LANCETS lancets USE AS DIRECTED TO CHECK BLOOD SUGAR THREE TIMES DAILY AND AT BEDTIME 400 each 1  . albuterol (PROAIR HFA) 108 (90 Base) MCG/ACT inhaler INHALE 2 PUFFS BY MOUTH EVERY  4 HOURS AS NEEDED FOR SHORTNESS OF BREATH 8.5 g 5  . allopurinol (ZYLOPRIM) 100 MG tablet TAKE 1 TABLET BY MOUTH ONCE DAILY. 90 tablet 1  . aspirin EC 81 MG EC tablet Take 1 tablet (81 mg total) by mouth daily.    [name] atorvastatin (LIPITOR) 80 MG tablet Take 80 mg by mouth daily.    . carvedilol (COREG) 25 MG tablet Take 25 mg by mouth 2 (two) times daily with a meal.    . docusate sodium (COLACE) 100 MG capsule Take 1 capsule (100 mg total) by mouth 2 (two) times daily as needed for mild constipation. 30 capsule 0  . fluticasone (FLONASE) 50 MCG/ACT nasal spray Place 2 sprays into both nostrils daily as needed for allergies or rhinitis. 48 g 1  . Fluticasone-Salmeterol (ADVAIR DISKUS) 250-50 MCG/DOSE AEPB Inhale 1 puff into the lungs 2 (two) times daily. 180 each 3  . glucose blood (ACCU-CHEK GUIDE) test strip To check blood sugar before meals and bedtime 400 each 12  . hydrALAZINE (APRESOLINE) 100 MG tablet TAKE (1) TABLET BY MOUTH THREE TIMES DAILY 90 tablet 5  .  HYDROcodone-acetaminophen (NORCO/VICODIN) 5-325 MG tablet Take 1 tablet by mouth every 8 (eight) hours as needed for moderate pain. 15 tablet 0  . hydrOXYzine (ATARAX/VISTARIL) 25 MG tablet TAKE (1) TABLET BY MOUTH EVERY SIX HOURS AS NEEDED FOR ITCHING. 120 tablet 0  . insulin aspart (NOVOLOG FLEXPEN) 100 UNIT/ML FlexPen Inject 25 Units into the skin 3 (three) times daily with meals. 15 mL 11  . Insulin Pen Needle (PEN NEEDLES) 30G X 8 MM MISC 1 each by Does not apply route 3 (three) times daily before meals. AC for novolog injections 100 each 11  . isosorbide mononitrate (IMDUR) 30 MG 24 hr tablet TAKE (3) TABLETS BY MOUTH ONCE DAILY. 90 tablet 1  . Lancet Devices (ACCU-CHEK SOFTCLIX) lancets Use as instructed daily. 1 each 5  . LANTUS SOLOSTAR 100 UNIT/ML Solostar Pen INJECT 60 UNITS S.Q. TWICE DAILY. (Patient taking differently: Inject 65 Units into the skin 2 (two) times daily. ) 24 mL 11  . liraglutide (VICTOZA) 18 MG/3ML SOPN Start with 0.74m daily and increase by 0.650mper week until max dose of 1.8 mg dose achieved. 1 pen 6  . loratadine (CLARITIN) 10 MG tablet TAKE 1 TABLET BY MOUTH ONCE DAILY. 90 tablet 1  . mometasone (ELOCON) 0.1 % cream Apply 1 application topically daily. 45 g 0  . montelukast (SINGULAIR) 10 MG tablet TAKE ONE TABLET BY MOUTH AT BEDTIME. 90 tablet 1  . mupirocin ointment (BACTROBAN) 2 % APPLY INTO THE NOSTRILS TWICE DAILY AS DIRECTED. 22 g 0  . nitroGLYCERIN (NITROSTAT) 0.4 MG SL tablet Place 1 tablet (0.4 mg total) under the tongue every 5 (five) minutes x 3 doses as needed for chest pain. 25 tablet 12  . omeprazole (PRILOSEC) 20 MG capsule Take 1 capsule (20 mg total) by mouth daily. 90 capsule 3  . pregabalin (LYRICA) 75 MG capsule Take 1 capsule (75 mg total) by mouth 2 (two) times daily. 180 capsule 1  . SENNA PLUS 8.6-50 MG tablet TAKE ONE TABLET BY MOUTH AT BEDTIME. 90 tablet 1  . torsemide (DEMADEX) 20 MG tablet TAKE (2) TABLETS BY MOUTH TWICE DAILY. 120 tablet  5  . triamcinolone cream (KENALOG) 0.1 % APPLY TO AFFECTED AREA TWICE DAILY 80 g 0  . valACYclovir (VALTREX) 500 MG tablet TAKE (1) TABLET BY MOUTH EVERY OTHER DAY. 45 tablet 1  .  Vitamin D, Ergocalciferol, (DRISDOL) 50000 units CAPS capsule TAKE 1 CAPSULE BY MOUTH ONCE A MONTH 3 capsule 3   No current facility-administered medications for this visit.     Allergies:   Shellfish allergy; Diazepam; and Morphine and related    Social History:  The patient  reports that she quit smoking about 37 years ago. Her smoking use included cigarettes. She has never used smokeless tobacco. She reports current alcohol use. She reports previous drug use. Drug: "Crack" cocaine.   Family History:  The patient's family history includes Colon cancer in her mother; Diabetes in her paternal grandmother; Heart attack in her maternal grandmother and another family member; Hypertension in her brother and sister.    ROS:  General:no colds or fevers, + weight loss since beginning dialysis Skin:no rashes or ulcers HEENT:no blurred vision, no congestion CV:see HPI PUL:see HPI GI:no diarrhea constipation or melena, no indigestion GU:no hematuria, no dysuria MS:no joint pain, no claudication Neuro:no syncope, no lightheadedness Endo:+ diabetes, no thyroid disease  Wt Readings from Last 3 Encounters:  07/28/18 289 lb 1.9 oz (131.1 kg)  06/01/18 295 lb 3.2 oz (133.9 kg)  06/01/18 293 lb 6.4 oz (133.1 kg)     PHYSICAL EXAM: VS:  BP 138/62   Pulse 87   Ht _0  (1.727 m)   Wt 289 lb 1.9 oz (131.1 kg)   SpO2 96%   BMI 43.96 kg/m  , BMI Body mass index is 43.96 kg/m. General:Pleasant affect, NAD Skin:Warm and dry, brisk capillary refill HEENT:normocephalic, sclera clear, mucus membranes moist Neck:supple, no JVD, no bruits  Heart:S1S2 RRR with 2/6 systolic murmur, no gallup, rub or click Lungs:clear without rales, rhonchi, or wheezes LTR:VUYE, non tender, + BS, do not palpate liver spleen or  masses Ext:no lower ext edema, 2+ pedal pulses, 2+ radial pulses Neuro:alert and oriented X 3, MAE, follows commands, + facial symmetry    EKG:  EKG is not ordered today. The ekg from Nov is reviewed and no changes.    Recent Labs: 08/05/2017: NT-Pro BNP 401 10/03/2017: B Natriuretic Peptide 147.0 12/25/2017: TSH 1.577 05/09/2018: ALT 19 05/11/2018: Magnesium 2.1 05/13/2018: BUN 75; Creatinine, Ser 2.28; Hemoglobin 8.1; Platelets 201; Potassium 3.6; Sodium 135    Lipid Panel    Component Value Date/Time   CHOL 122 09/13/2016 1245   TRIG 168 (H) 09/13/2016 1245   HDL 36 (L) 09/13/2016 1245   CHOLHDL 3.4 09/13/2016 1245   VLDL 34 09/13/2016 1245   LDLCALC 52 09/13/2016 1245       Other studies Reviewed: Additional studies/ records that were reviewed today include:  Last cath 04/2017  Conclusions: 1. Widely patent mid LAD stent. 2. No significant change in mild to moderate LCx, OM, and diagonal disease compared with 09/2016. 3. Normal left ventricular filling pressure.  Recommendations: 1. Continue medical therapy and secondary prevention. I will restart isosorbide mononitrate at 90 mg daily with hope of weaning off NTG infusion later today. Gentle hydration given CKD and normal LVEDP. Hold furosemide today and reassess volume status and renal function tomorrow before restarting diuretic therapy.   Echo 09/15/17 Study Conclusions  - Left ventricle: The cavity size was at the upper limits of   normal. Wall thickness was increased in a pattern of mild LVH.   Systolic function was vigorous. The estimated ejection fraction   was in the range of 65% to 70%. Wall motion was normal; there   were no regional wall motion abnormalities. Doppler parameters   are consistent  with abnormal left ventricular relaxation (grade 1   diastolic dysfunction). Doppler parameters are consistent with   high ventricular filling pressure. - Aortic valve: Transvalvular velocity was increased.  There was   moderate stenosis. There was trivial regurgitation. Peak velocity   (S): 308 cm/s. Mean gradient (S): 20 mm Hg. Valve area (VTI):   1.34 cm^2. Valve area (Vmax): 1.11 cm^2. - Ascending aorta: The ascending aorta was mildly dilated. - Mitral valve: Calcified annulus. Mildly thickened leaflets . - Left atrium: The atrium was mildly dilated. - Right ventricle: The cavity size was normal. Systolic function   was normal. - Inferior vena cava: The vessel was dilated. The respirophasic   diameter changes were blunted (< 50%), consistent with elevated   central venous pressure. - Pericardium, extracardiac: A trivial pericardial effusion was   identified posterior to the heart.  ASSESSMENT AND PLAN:  1.  Pre-op eval.  Pt with CAD including MI in 09/2016 undergoing AV graft insertion for HD.  She has subclavian cath now, causing her pain at time.  Her stent to LAD was placed 09/2016 and at this point can stop Brilinta.  Continue asa.  She has no chest pain and last cath 10/20-18 with patent stent and non obstructive CAD.  She does have DOE this has increased with dialysis, will check echo to eval her moderate AS and EF.  If stable she would be cleared to have done.       2.  CAD see above no chest pain  3.   Chronic diastolic HF though improved with dialysis, still on diuretic followed by renal.   4.  HLD continue statin   5.  HTN controlled.    6.  ESRD on HD  Follow up in 6 months with Dr. Burt Knack.   Echo 07/29/18  Study Conclusions  - Left ventricle: The cavity size was normal. Wall thickness was   increased in a pattern of moderate LVH. Systolic function was   normal. The estimated ejection fraction was in the range of 60%   to 65%. Doppler parameters are consistent with abnormal left   ventricular relaxation (grade 1 diastolic dysfunction). - Aortic valve: There was mild to moderate stenosis. There was   trivial regurgitation. Valve area (VTI): 2.07 cm^2. Valve area    (Vmax): 1.69 cm^2. Valve area (Vmean): 1.67 cm^2. - Mitral valve: Calcified annulus. - Left atrium: The atrium was mildly dilated. - Atrial septum: No defect or patent foramen ovale was identified.   Current medicines are reviewed with the patient today.  The patient Has no concerns regarding medicines.  The following changes have been made:  See above Labs/ tests ordered today include:see above  Disposition:   FU:  see above  Signed, Cecilie Kicks, NP  07/28/2018 5:46 PM    [facility] [place], [place] [place], [place] Phone: [phone]; Fax: [phone]  Echo stable discussed with  Dr. Burt Knack and acceptable risk for procedure.     Primary Cardiologist: Sherren Mocha, MD  Chart reviewed as part of pre-operative protocol coverage. Given past medical history and time since last visit, based on ACC/AHA guidelines, Kelvin M Nihiser would be at acceptable risk for the planned procedure without further cardiovascular testing.   I will route this recommendation to the requesting party via Epic fax function and remove from pre-op pool.  Please call with questions.  Cecilie Kicks, NP 08/01/2018, 8:15 AM

## 2018-07-28 ENCOUNTER — Encounter: Payer: Self-pay | Admitting: Cardiology

## 2018-07-28 ENCOUNTER — Ambulatory Visit (INDEPENDENT_AMBULATORY_CARE_PROVIDER_SITE_OTHER): Payer: Medicare Other | Admitting: Cardiology

## 2018-07-28 VITALS — BP 138/62 | HR 87 | Ht 68.0 in | Wt 289.1 lb

## 2018-07-28 DIAGNOSIS — N2581 Secondary hyperparathyroidism of renal origin: Secondary | ICD-10-CM | POA: Diagnosis not present

## 2018-07-28 DIAGNOSIS — N186 End stage renal disease: Secondary | ICD-10-CM | POA: Diagnosis not present

## 2018-07-28 DIAGNOSIS — E785 Hyperlipidemia, unspecified: Secondary | ICD-10-CM | POA: Diagnosis not present

## 2018-07-28 DIAGNOSIS — I10 Essential (primary) hypertension: Secondary | ICD-10-CM

## 2018-07-28 DIAGNOSIS — D509 Iron deficiency anemia, unspecified: Secondary | ICD-10-CM | POA: Diagnosis not present

## 2018-07-28 DIAGNOSIS — D631 Anemia in chronic kidney disease: Secondary | ICD-10-CM | POA: Diagnosis not present

## 2018-07-28 DIAGNOSIS — I5032 Chronic diastolic (congestive) heart failure: Secondary | ICD-10-CM

## 2018-07-28 DIAGNOSIS — I2510 Atherosclerotic heart disease of native coronary artery without angina pectoris: Secondary | ICD-10-CM | POA: Diagnosis not present

## 2018-07-28 DIAGNOSIS — I350 Nonrheumatic aortic (valve) stenosis: Secondary | ICD-10-CM | POA: Diagnosis not present

## 2018-07-28 DIAGNOSIS — Z992 Dependence on renal dialysis: Secondary | ICD-10-CM | POA: Diagnosis not present

## 2018-07-28 DIAGNOSIS — Z23 Encounter for immunization: Secondary | ICD-10-CM | POA: Diagnosis not present

## 2018-07-28 NOTE — Patient Instructions (Addendum)
Medication Instructions:  STOP TAKING BIRLINTA   If you need a refill on your cardiac medications before your next appointment, please call your pharmacy.   Lab work:  LIPIDS AND LFT TODAY   If you have labs (blood work) drawn today and your tests are completely normal, you will receive your results only by: [name] MyChart Message (if you have MyChart) OR . A paper copy in the mail If you have any lab test that is abnormal or we need to change your treatment, we will call you to review the results.  Testing/Procedures: ASAP Your physician has requested that you have an echocardiogram. Echocardiography is a painless test that uses sound waves to create images of your heart. It provides your doctor with information about the size and shape of your heart and how well your heart's chambers and valves are working. This procedure takes approximately one hour. There are no restrictions for this procedure.  Follow-Up: At [facility], you and your health needs are our priority.  As part of our continuing mission to provide you with exceptional heart care, we have created designated Provider Care Teams.  These Care Teams include your primary Cardiologist (physician) and Advanced Practice Providers (APPs -  Physician Assistants and Nurse Practitioners) who all work together to provide you with the care you need, when you need it. You will need a follow up appointment in:  6  MONTHS  Please call our office 2 months in advance to schedule this appointment.  You may see Sherren Mocha, MD  or one of the following Advanced Practice Providers on your designated Care Team: Richardson Dopp, PA-C [place], [place] . Daune Perch, NP  Any Other Special Instructions Will Be Listed Below (If Applicable).

## 2018-07-29 ENCOUNTER — Other Ambulatory Visit: Payer: Self-pay

## 2018-07-29 ENCOUNTER — Ambulatory Visit (HOSPITAL_COMMUNITY): Payer: Medicare Other | Attending: Cardiovascular Disease

## 2018-07-29 DIAGNOSIS — I350 Nonrheumatic aortic (valve) stenosis: Secondary | ICD-10-CM

## 2018-07-30 DIAGNOSIS — N186 End stage renal disease: Secondary | ICD-10-CM | POA: Diagnosis not present

## 2018-07-30 DIAGNOSIS — D509 Iron deficiency anemia, unspecified: Secondary | ICD-10-CM | POA: Diagnosis not present

## 2018-07-30 DIAGNOSIS — D631 Anemia in chronic kidney disease: Secondary | ICD-10-CM | POA: Diagnosis not present

## 2018-07-30 DIAGNOSIS — N2581 Secondary hyperparathyroidism of renal origin: Secondary | ICD-10-CM | POA: Diagnosis not present

## 2018-07-30 DIAGNOSIS — Z23 Encounter for immunization: Secondary | ICD-10-CM | POA: Diagnosis not present

## 2018-08-02 ENCOUNTER — Telehealth: Payer: Self-pay

## 2018-08-02 ENCOUNTER — Telehealth (INDEPENDENT_AMBULATORY_CARE_PROVIDER_SITE_OTHER): Payer: Self-pay | Admitting: Vascular Surgery

## 2018-08-02 DIAGNOSIS — D509 Iron deficiency anemia, unspecified: Secondary | ICD-10-CM | POA: Diagnosis not present

## 2018-08-02 DIAGNOSIS — N2581 Secondary hyperparathyroidism of renal origin: Secondary | ICD-10-CM | POA: Diagnosis not present

## 2018-08-02 DIAGNOSIS — D631 Anemia in chronic kidney disease: Secondary | ICD-10-CM | POA: Diagnosis not present

## 2018-08-02 DIAGNOSIS — Z23 Encounter for immunization: Secondary | ICD-10-CM | POA: Diagnosis not present

## 2018-08-02 DIAGNOSIS — N186 End stage renal disease: Secondary | ICD-10-CM | POA: Diagnosis not present

## 2018-08-02 DIAGNOSIS — J45909 Unspecified asthma, uncomplicated: Secondary | ICD-10-CM

## 2018-08-02 NOTE — Telephone Encounter (Signed)
Spoke to pt and she is asking for a different inhaler that you have prescribed for her other than the advair and albuterol.  She is not sure of the name of it.  Can you please check and if she has one she would like to get a new rx for it sent to the pharmacy.

## 2018-08-03 ENCOUNTER — Encounter (INDEPENDENT_AMBULATORY_CARE_PROVIDER_SITE_OTHER): Payer: Self-pay

## 2018-08-03 ENCOUNTER — Telehealth (INDEPENDENT_AMBULATORY_CARE_PROVIDER_SITE_OTHER): Payer: Self-pay

## 2018-08-03 MED ORDER — MOMETASONE FURO-FORMOTEROL FUM 200-5 MCG/ACT IN AERO: 2.0000 | INHALATION_SPRAY | Freq: Two times a day (BID) | RESPIRATORY_TRACT | 5 refills | Status: DC

## 2018-08-03 NOTE — Telephone Encounter (Signed)
The only other inhaler I can find is [facility] and was given to her in the hospital. This is the same class of medicine as Advair. If it works better I will send in and she can stop the advair. Just see if Dulera sounds like what she is looking for please?

## 2018-08-03 NOTE — Telephone Encounter (Signed)
Patient advised as directed below.  Thanks,  -George Alcantar 

## 2018-08-03 NOTE — Telephone Encounter (Signed)
Attempted to contact the patient to schedule her AVF surgery and had to leave a message for a return call.

## 2018-08-03 NOTE — Telephone Encounter (Signed)
Patient states [name] does sound right. Patient wants rx for [name] sent to [name]. Patient also wanted [name] to know for the last 2 weeks her nose has been bleeding and she has been spitting up blood.

## 2018-08-03 NOTE — Telephone Encounter (Signed)
Spoke with the patient and she is now scheduled to have her surgery with Dr. Delana Meyer on 08/19/2018 and her pre-op is 08/10/2018 with a 11:15 am arrival time. This will be mailed out as well.

## 2018-08-03 NOTE — Telephone Encounter (Signed)
See below

## 2018-08-03 NOTE — Addendum Note (Signed)
Addended by: [name] on: 08/03/2018 11:07 AM   Modules accepted: Orders

## 2018-08-03 NOTE — Telephone Encounter (Signed)
That is probably due to dry air. Stop flonase for now. Use saline irrigations only.   [facility] sent in. Stop Advair.

## 2018-08-04 DIAGNOSIS — N186 End stage renal disease: Secondary | ICD-10-CM | POA: Diagnosis not present

## 2018-08-04 DIAGNOSIS — D631 Anemia in chronic kidney disease: Secondary | ICD-10-CM | POA: Diagnosis not present

## 2018-08-04 DIAGNOSIS — Z23 Encounter for immunization: Secondary | ICD-10-CM | POA: Diagnosis not present

## 2018-08-04 DIAGNOSIS — N2581 Secondary hyperparathyroidism of renal origin: Secondary | ICD-10-CM | POA: Diagnosis not present

## 2018-08-04 DIAGNOSIS — D509 Iron deficiency anemia, unspecified: Secondary | ICD-10-CM | POA: Diagnosis not present

## 2018-08-05 DIAGNOSIS — Z23 Encounter for immunization: Secondary | ICD-10-CM | POA: Diagnosis not present

## 2018-08-05 DIAGNOSIS — N2581 Secondary hyperparathyroidism of renal origin: Secondary | ICD-10-CM | POA: Diagnosis not present

## 2018-08-05 DIAGNOSIS — D631 Anemia in chronic kidney disease: Secondary | ICD-10-CM | POA: Diagnosis not present

## 2018-08-05 DIAGNOSIS — D509 Iron deficiency anemia, unspecified: Secondary | ICD-10-CM | POA: Diagnosis not present

## 2018-08-05 DIAGNOSIS — N186 End stage renal disease: Secondary | ICD-10-CM | POA: Diagnosis not present

## 2018-08-08 ENCOUNTER — Other Ambulatory Visit: Payer: Medicaid Other

## 2018-08-08 ENCOUNTER — Other Ambulatory Visit (INDEPENDENT_AMBULATORY_CARE_PROVIDER_SITE_OTHER): Payer: Self-pay | Admitting: Nurse Practitioner

## 2018-08-09 DIAGNOSIS — D631 Anemia in chronic kidney disease: Secondary | ICD-10-CM | POA: Diagnosis not present

## 2018-08-09 DIAGNOSIS — Z23 Encounter for immunization: Secondary | ICD-10-CM | POA: Diagnosis not present

## 2018-08-09 DIAGNOSIS — N2581 Secondary hyperparathyroidism of renal origin: Secondary | ICD-10-CM | POA: Diagnosis not present

## 2018-08-09 DIAGNOSIS — N186 End stage renal disease: Secondary | ICD-10-CM | POA: Diagnosis not present

## 2018-08-09 DIAGNOSIS — D509 Iron deficiency anemia, unspecified: Secondary | ICD-10-CM | POA: Diagnosis not present

## 2018-08-10 ENCOUNTER — Other Ambulatory Visit: Payer: Self-pay

## 2018-08-10 ENCOUNTER — Encounter
Admission: RE | Admit: 2018-08-10 | Discharge: 2018-08-10 | Disposition: A | Discharge status: Home / Self Care | Payer: Medicare Other | Source: Ambulatory Visit | Attending: Vascular Surgery | Admitting: Vascular Surgery

## 2018-08-10 DIAGNOSIS — Z01812 Encounter for preprocedural laboratory examination: Secondary | ICD-10-CM | POA: Diagnosis not present

## 2018-08-10 LAB — BASIC METABOLIC PANEL WITH GFR
Anion gap: 12 (ref 5–15)
BUN: 42 mg/dL — ABNORMAL HIGH (ref 6–20)
CO2: 29 mmol/L (ref 22–32)
Calcium: 7.9 mg/dL — ABNORMAL LOW (ref 8.9–10.3)
Chloride: 97 mmol/L — ABNORMAL LOW (ref 98–111)
Creatinine, Ser: 3.57 mg/dL — ABNORMAL HIGH (ref 0.44–1.00)
GFR calc Af Amer: 15 mL/min — ABNORMAL LOW
GFR calc non Af Amer: 13 mL/min — ABNORMAL LOW
Glucose, Bld: 331 mg/dL — ABNORMAL HIGH (ref 70–99)
Potassium: 3.4 mmol/L — ABNORMAL LOW (ref 3.5–5.1)
Sodium: 138 mmol/L (ref 135–145)

## 2018-08-10 LAB — CBC WITH DIFFERENTIAL/PLATELET
Abs Immature Granulocytes: 0.16 K/uL — ABNORMAL HIGH (ref 0.00–0.07)
Basophils Absolute: 0 K/uL (ref 0.0–0.1)
Basophils Relative: 0 %
Eosinophils Absolute: 0.1 K/uL (ref 0.0–0.5)
Eosinophils Relative: 2 %
HCT: 28.7 % — ABNORMAL LOW (ref 36.0–46.0)
Hemoglobin: 8.9 g/dL — ABNORMAL LOW (ref 12.0–15.0)
Immature Granulocytes: 3 %
Lymphocytes Relative: 12 %
Lymphs Abs: 0.7 K/uL (ref 0.7–4.0)
MCH: 28.6 pg (ref 26.0–34.0)
MCHC: 31 g/dL (ref 30.0–36.0)
MCV: 92.3 fL (ref 80.0–100.0)
Monocytes Absolute: 0.7 K/uL (ref 0.1–1.0)
Monocytes Relative: 12 %
Neutro Abs: 4.3 K/uL (ref 1.7–7.7)
Neutrophils Relative %: 71 %
Platelets: 200 K/uL (ref 150–400)
RBC: 3.11 MIL/uL — ABNORMAL LOW (ref 3.87–5.11)
RDW: 16.9 % — ABNORMAL HIGH (ref 11.5–15.5)
WBC: 6 K/uL (ref 4.0–10.5)
nRBC: 0 % (ref 0.0–0.2)

## 2018-08-10 LAB — APTT: aPTT: 30 seconds (ref 24–36)

## 2018-08-10 LAB — TYPE AND SCREEN
ABO/RH(D): A POS
Antibody Screen: NEGATIVE

## 2018-08-10 LAB — PROTIME-INR
INR: 1.02
Prothrombin Time: 13.3 seconds (ref 11.4–15.2)

## 2018-08-10 NOTE — Patient Instructions (Signed)
Your procedure is scheduled on: August 19, 2018 FRIDAY Report to Day Surgery on the 2nd floor of the [facility]. To find out your arrival time, please call [phone] between 1PM - 3PM on: Thursday August 18, 2018  REMEMBER: Instructions that are not followed completely may result in serious medical risk, up to and including death; or upon the discretion of your surgeon and anesthesiologist your surgery may need to be rescheduled.  Do not eat food after midnight the night before surgery.  No gum chewing, lozengers or hard candies.  You may however, drink CLEAR liquids up to 2 hours before you are scheduled to arrive for your surgery. Do not drink anything within 2 hours of the start of your surgery.  Clear liquids include: - water   Do NOT drink anything that is not on this list.  Type 1 and Type 2 diabetics should only drink water.  No Alcohol for 24 hours before or after surgery.  No Smoking including e-cigarettes for 24 hours prior to surgery.  No chewable tobacco products for at least 6 hours prior to surgery.  No nicotine patches on the day of surgery.  On the morning of surgery brush your teeth with toothpaste and water, you may rinse your mouth with mouthwash if you wish. Do not swallow any toothpaste or mouthwash.  Notify your doctor if there is any change in your medical condition (cold, fever, infection).  Do not wear jewelry, make-up, hairpins, clips or nail polish.  Do not wear lotions, powders, or perfumes.   Do not shave 48 hours prior to surgery.   Contacts and dentures may not be worn into surgery.  Do not bring valuables to the hospital, including drivers license, insurance or credit cards.   is not responsible for any belongings or valuables.   TAKE THESE MEDICATIONS THE MORNING OF SURGERY: Allopurinol Carvedilol Atorvastatin Hydralazine lyrica  valtrex omeprazole (take one the night before and one on the morning of surgery - helps  to prevent nausea after surgery.)   Use CHG  wipes as directed on instruction sheets  Use inhalers day of surgery   Take 1/2 of usual insulin dose the night before surgery and none on the morning of surgery.  Follow recommendations from Cardiologist, Pulmonologist or PCP regarding stopping Aspirin, Coumadin, Plavix, Eliquis, Pradaxa, or Pletal.  Stop Anti-inflammatories (NSAIDS) such as Advil, Aleve, Ibuprofen, Motrin, Naproxen, Naprosyn and Aspirin based products such as Excedrin, Goodys Powder, BC Powder. (May take Tylenol or Acetaminophen if needed.)  Stop ANY OVER THE COUNTER supplements until after surgery. (May continue Vitamin D, Vitamin B, and multivitamin.)  Wear comfortable clothing (specific to your surgery type) to the hospital.  Plan for stool softeners for home use.  If you are being discharged the day of surgery, you will not be allowed to drive home. You will need a responsible adult to drive you home and stay with you that night.   If you are taking public transportation, you will need to have a responsible adult with you. Please confirm with your physician that it is acceptable to use public transportation.   Please call [phone] if you have any questions about these instructions.

## 2018-08-10 NOTE — Pre-Procedure Instructions (Signed)
Abnormal labs faxed to Dr Delana Meyer office

## 2018-08-11 ENCOUNTER — Encounter: Payer: Self-pay | Admitting: Emergency Medicine

## 2018-08-11 ENCOUNTER — Other Ambulatory Visit: Payer: Self-pay

## 2018-08-11 ENCOUNTER — Emergency Department
Admission: EM | Admit: 2018-08-11 | Discharge: 2018-08-11 | Disposition: A | Discharge status: Home / Self Care | Payer: Medicare Other | Attending: Emergency Medicine | Admitting: Emergency Medicine

## 2018-08-11 DIAGNOSIS — I252 Old myocardial infarction: Secondary | ICD-10-CM | POA: Diagnosis not present

## 2018-08-11 DIAGNOSIS — R05 Cough: Secondary | ICD-10-CM | POA: Diagnosis present

## 2018-08-11 DIAGNOSIS — Z794 Long term (current) use of insulin: Secondary | ICD-10-CM | POA: Diagnosis not present

## 2018-08-11 DIAGNOSIS — Z87891 Personal history of nicotine dependence: Secondary | ICD-10-CM | POA: Diagnosis not present

## 2018-08-11 DIAGNOSIS — I5032 Chronic diastolic (congestive) heart failure: Secondary | ICD-10-CM | POA: Insufficient documentation

## 2018-08-11 DIAGNOSIS — I2510 Atherosclerotic heart disease of native coronary artery without angina pectoris: Secondary | ICD-10-CM | POA: Diagnosis not present

## 2018-08-11 DIAGNOSIS — Z7982 Long term (current) use of aspirin: Secondary | ICD-10-CM | POA: Diagnosis not present

## 2018-08-11 DIAGNOSIS — N186 End stage renal disease: Secondary | ICD-10-CM | POA: Diagnosis not present

## 2018-08-11 DIAGNOSIS — E1122 Type 2 diabetes mellitus with diabetic chronic kidney disease: Secondary | ICD-10-CM | POA: Insufficient documentation

## 2018-08-11 DIAGNOSIS — I132 Hypertensive heart and chronic kidney disease with heart failure and with stage 5 chronic kidney disease, or end stage renal disease: Secondary | ICD-10-CM | POA: Diagnosis not present

## 2018-08-11 DIAGNOSIS — Z955 Presence of coronary angioplasty implant and graft: Secondary | ICD-10-CM | POA: Diagnosis not present

## 2018-08-11 DIAGNOSIS — Z79899 Other long term (current) drug therapy: Secondary | ICD-10-CM | POA: Insufficient documentation

## 2018-08-11 DIAGNOSIS — J111 Influenza due to unidentified influenza virus with other respiratory manifestations: Secondary | ICD-10-CM | POA: Diagnosis not present

## 2018-08-11 DIAGNOSIS — Z992 Dependence on renal dialysis: Secondary | ICD-10-CM | POA: Diagnosis not present

## 2018-08-11 LAB — INFLUENZA PANEL BY PCR (TYPE A & B)
Influenza A By PCR: POSITIVE — AB
Influenza B By PCR: NEGATIVE

## 2018-08-11 MED ORDER — BENZONATATE 100 MG PO CAPS: ORAL_CAPSULE | ORAL | 0 refills | Status: DC

## 2018-08-11 MED ORDER — AZITHROMYCIN 500 MG PO TABS
500.0000 mg | ORAL_TABLET | Freq: Once | ORAL | Status: AC
  Administered 2018-08-11: 500 mg via ORAL
  Filled 2018-08-11: qty 1

## 2018-08-11 MED ORDER — OSELTAMIVIR PHOSPHATE 75 MG PO CAPS: 75.0000 mg | ORAL_CAPSULE | Freq: Two times a day (BID) | ORAL | 0 refills | Status: AC

## 2018-08-11 MED ORDER — IPRATROPIUM-ALBUTEROL 0.5-2.5 (3) MG/3ML IN SOLN
3.0000 mL | Freq: Once | RESPIRATORY_TRACT | Status: AC
  Administered 2018-08-11: 3 mL via RESPIRATORY_TRACT
  Filled 2018-08-11: qty 3

## 2018-08-11 MED ORDER — AZITHROMYCIN 250 MG PO TABS: 250.0000 mg | ORAL_TABLET | Freq: Every day | ORAL | 0 refills | Status: AC

## 2018-08-11 NOTE — ED Triage Notes (Addendum)
Pt to triage via w/c, mask in place with no distress noted; reports since Tuesday having chills, body aches, prod cough yellow sputum; st recent exposure to flu

## 2018-08-11 NOTE — ED Notes (Signed)
Pt given mask.

## 2018-08-11 NOTE — Discharge Instructions (Addendum)
You have tested positive for the flu. You must start the Tamiflu tonight when you get the prescription. You are also being treated for a potential pneumonia/bronchitis with a once-daily antibiotic. You were given the first dose in the ED, and should start this prescription tomorrow. Take the cough medicine along with Tylenol, Motrin, and Delsym for additional cough relief. Follow-up with your provider or return as needed.

## 2018-08-11 NOTE — ED Provider Notes (Signed)
[facility] Emergency Department Provider Note ____________________________________________  Time seen: 2055  I have reviewed the triage vital signs and the nursing notes.  HISTORY  Chief Complaint  Cough and Generalized Body Aches  HPI [name] is a 63 y.o. female presents to the ED for 2-day complaint of cough, congestion, shortness of breath, and fatigue.  Patient received the seasonal flu vaccine, but notes that since Tuesday she has had chills, body aches, productive cough, and malaise.  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  She presents now for further evaluation of her symptoms.  She uses a daily nebulizer treatment and has been using it since her symptoms began.  Past Medical History:  Diagnosis Date  . Anemia   . Aortic stenosis    Echo 8/18: mean 13, peak 28, LVOT/AV mean velocity 0.51  . Arthritis   . Asthma    As a child   . Bronchitis   . CAD (coronary artery disease)    a. 09/2016: 50% Ost 1st Mrg stenosis, 50% 2nd Mrg stenosis, 20% Mid-Cx, 95% Prox LAD, 40% mid-LAD, and 10% dist-LAD stenosis. Staged PCI with DES to Prox-LAD.   [name] Chronic combined systolic and diastolic CHF (congestive heart failure) ([place]) 2011   echo 2/18: EF 55-60, normal wall motion, grade 2 diastolic dysfunction, trivial AI // echo 3/18: Septal and apical HK, EF 45-50, normal wall motion, trivial AI, mild LAE, PASP 38 // echo 8/18: EF 60-65, normal wall motion, grade 1 diastolic dysfunction, calcified aortic valve leaflets, mild aortic stenosis (mean 13, peak 28, LVOT/AV mean velocity 0.51), mild AI, moderate MAC, mild LAE, trivial TR   . Chronic kidney disease    STAGE 4  . Depression   . Diabetes mellitus Dx 1989  . GERD (gastroesophageal reflux disease)   . Gout   . Hepatitis C Dx 2013  . Hypertension Dx 1989  . Myocardial infarction ([place]) 07/2015  . Obesity   . Obstructive sleep apnea   . Pancreatitis 2013  . Pneumonia   .  Refusal of blood transfusions as patient is Jehovah's Witness   . Tendinitis   . Tremors of nervous system    LEFT HAND  . Ulcer 2010    Patient Active Problem List   Diagnosis Date Noted  . ESRD on dialysis ([place]) 06/01/2018  . ARF (acute renal failure) ([place]) 05/09/2018  . Colon cancer screening   . LGSIL on Pap smear of cervix 01/27/2018  . Gout 12/25/2017  . AKI (acute kidney injury) ([place]) 12/24/2017  . Hyperglycemia 12/24/2017  . Chronic diastolic heart failure ([place]) 09/30/2017  . Lymphedema 09/30/2017  . Aortic stenosis   . Chronic pain of right knee 02/12/2017  . Chronic gout due to renal impairment of multiple sites without tophus 02/12/2017  . Esophageal dysphagia 02/12/2017  . Osteoarthritis 01/22/2017  . Diabetic hyperosmolar non-ketotic state ([place]) 12/13/2016  . CAD (coronary artery disease) 12/13/2016  . Hyperlipidemia 12/13/2016  . Hyperphosphatemia 12/13/2016  . Asthma 11/29/2015  . Depression 09/05/2015  . GERD (gastroesophageal reflux disease) 03/25/2015  . Environmental allergies 03/14/2015  . Chronic hepatitis C without hepatic coma ([place]) 01/31/2015  . Diabetic neuropathy ([place]) 01/31/2015  . OSA (obstructive sleep apnea) 01/01/2015  . Poor dentition 11/13/2014  . Essential hypertension 10/08/2014  . Obesity 10/08/2014  . Diabetes mellitus type 2, uncontrolled, with complications ([place]) 58/59/2924    Class: Chronic  . Anemia in stage 3 chronic kidney  disease ([place]) 03/25/2012    Past Surgical History:  Procedure Laterality Date  . APPLICATION OF WOUND VAC Left 08/14/2017   Procedure: APPLICATION OF WOUND VAC Exchange;  Surgeon: Robert Bellow, MD;  Location: [facility] ORS;  Service: General;  Laterality: Left;  . CHOLECYSTECTOMY    . COLONOSCOPY WITH PROPOFOL N/A 02/03/2018   Procedure: COLONOSCOPY WITH PROPOFOL;  Surgeon: Lin Landsman, MD;  Location: [facility] ENDOSCOPY;  Service: Gastroenterology;  Laterality: N/A;  . CORONARY ANGIOPLASTY  07/2015    STENT  . CORONARY STENT INTERVENTION N/A 09/18/2016   Procedure: Coronary Stent Intervention;  Surgeon: Troy Sine, MD;  Location: [place] CV LAB;  Service: Cardiovascular;  Laterality: N/A;  . DIALYSIS/PERMA CATHETER INSERTION N/A 05/10/2018   Procedure: DIALYSIS/PERMA CATHETER INSERTION;  Surgeon: Katha Cabal, MD;  Location: [place] CV LAB;  Service: Cardiovascular;  Laterality: N/A;  . DRESSING CHANGE UNDER ANESTHESIA Left 08/15/2017   Procedure: exploration of wound for bleeding;  Surgeon: Robert Bellow, MD;  Location: [facility] ORS;  Service: General;  Laterality: Left;  . ESOPHAGOGASTRODUODENOSCOPY (EGD) WITH PROPOFOL N/A 02/03/2018   Procedure: ESOPHAGOGASTRODUODENOSCOPY (EGD) WITH PROPOFOL;  Surgeon: Lin Landsman, MD;  Location: [facility] ENDOSCOPY;  Service: Gastroenterology;  Laterality: N/A;  . EYE SURGERY    . INCISION AND DRAINAGE ABSCESS Left 08/12/2017   Procedure: INCISION AND DRAINAGE ABSCESS;  Surgeon: Robert Bellow, MD;  Location: [facility] ORS;  Service: General;  Laterality: Left;  . KNEE ARTHROSCOPY    . LEFT HEART CATH N/A 09/18/2016   Procedure: Left Heart Cath;  Surgeon: Troy Sine, MD;  Location: [place] CV LAB;  Service: Cardiovascular;  Laterality: N/A;  . LEFT HEART CATH AND CORONARY ANGIOGRAPHY N/A 09/16/2016   Procedure: Left Heart Cath and Coronary Angiography;  Surgeon: Burnell Blanks, MD;  Location: [place] CV LAB;  Service: Cardiovascular;  Laterality: N/A;  . LEFT HEART CATH AND CORONARY ANGIOGRAPHY N/A 04/29/2017   Procedure: LEFT HEART CATH AND CORONARY ANGIOGRAPHY;  Surgeon: Nelva Bush, MD;  Location: [place] CV LAB;  Service: Cardiovascular;  Laterality: N/A;  . LOWER EXTREMITY ANGIOGRAPHY Right 03/08/2018   Procedure: LOWER EXTREMITY ANGIOGRAPHY;  Surgeon: Katha Cabal, MD;  Location: [place] CV LAB;  Service: Cardiovascular;  Laterality: Right;  . TUBAL LIGATION    . TUBAL LIGATION      Prior to  Admission medications   Medication Sig Start Date End Date Taking? Authorizing Provider  ACCU-CHEK SOFTCLIX LANCETS lancets USE AS DIRECTED TO CHECK BLOOD SUGAR THREE TIMES DAILY AND AT BEDTIME 07/20/18   [name], PA-C  albuterol (PROAIR HFA) 108 (90 Base) MCG/ACT inhaler INHALE 2 PUFFS BY MOUTH EVERY 4 HOURS AS NEEDED FOR SHORTNESS OF BREATH Patient taking differently: Inhale 2 puffs into the lungs every 4 (four) hours as needed for wheezing or shortness of breath. INHALE 2 PUFFS BY MOUTH EVERY 4 HOURS AS NEEDED FOR SHORTNESS OF BREATH 06/24/18   [name], PA-C  allopurinol (ZYLOPRIM) 100 MG tablet TAKE 1 TABLET BY MOUTH ONCE DAILY. Patient taking differently: Take 100 mg by mouth daily.  06/24/18   [name], PA-C  aspirin EC 81 MG EC tablet Take 1 tablet (81 mg total) by mouth daily. 09/19/16   Strader, Fransisco Hertz, PA-C  atorvastatin (LIPITOR) 80 MG tablet Take 80 mg by mouth daily.    [provider]  azithromycin (ZITHROMAX Z-PAK) 250 MG tablet Take 1 tablet (250 mg total) by mouth daily  for 4 days. 08/12/18 08/16/18  Lyrik Buresh, Dannielle Karvonen, PA-C  benzonatate (TESSALON PERLES) 100 MG capsule Take 1-2 tabs TID prn cough 08/11/18   Raydin Bielinski, Dannielle Karvonen, PA-C  carvedilol (COREG) 25 MG tablet Take 25 mg by mouth 2 (two) times daily with a meal.    [provider]  docusate sodium (COLACE) 100 MG capsule Take 1 capsule (100 mg total) by mouth 2 (two) times daily as needed for mild constipation. Patient taking differently: Take 100 mg by mouth daily as needed for mild constipation.  09/20/17   Gouru, Illene Silver, MD  fluticasone (FLONASE) 50 MCG/ACT nasal spray Place 2 sprays into both nostrils daily as needed for allergies or rhinitis. 09/28/17   [name], PA-C  glucose blood (ACCU-CHEK GUIDE) test strip To check blood sugar before meals and bedtime 04/14/18   [name], PA-C  hydrALAZINE (APRESOLINE) 100 MG tablet TAKE (1) TABLET BY  MOUTH THREE TIMES DAILY Patient taking differently: Take 100 mg by mouth 3 (three) times daily.  07/25/18   [name], PA-C  HYDROcodone-acetaminophen (NORCO/VICODIN) 5-325 MG tablet Take 1 tablet by mouth every 8 (eight) hours as needed for moderate pain. 05/14/18   [name], MD  hydrOXYzine (ATARAX/VISTARIL) 25 MG tablet TAKE (1) TABLET BY MOUTH EVERY SIX HOURS AS NEEDED FOR ITCHING. Patient taking differently: Take 50 mg by mouth every 6 (six) hours as needed for itching.  07/19/18   [name], PA-C  insulin aspart (NOVOLOG FLEXPEN) 100 UNIT/ML FlexPen Inject 25 Units into the skin 3 (three) times daily with meals. 03/30/18   [name], PA-C  Insulin Pen Needle (PEN NEEDLES) 30G X 8 MM MISC 1 each by Does not apply route 3 (three) times daily before meals. AC for novolog injections 11/15/17   [name] M, PA-C  isosorbide mononitrate (IMDUR) 30 MG 24 hr tablet TAKE (3) TABLETS BY MOUTH ONCE DAILY. Patient not taking: Reported on 08/04/2018 06/24/18   [name], PA-C  Lancet Devices [facility]) lancets Use as instructed daily. 04/14/17   Charlott Rakes, MD  LANTUS SOLOSTAR 100 UNIT/ML Solostar Pen INJECT 60 UNITS S.Q. TWICE DAILY. Patient taking differently: Inject 60 Units into the skin 2 (two) times daily.  03/01/18   [name], PA-C  liraglutide (VICTOZA) 18 MG/3ML SOPN Start with 0.34m daily and increase by 0.650mper week until max dose of 1.8 mg dose achieved. Patient taking differently: Inject 1.2 mg into the skin daily. Start with 0.74m45maily and increase by 0.74mg42mr week until max dose of 1.8 mg dose achieved. 04/29/18   BurnMar Daring-C  loratadine (CLARITIN) 10 MG tablet TAKE 1 TABLET BY MOUTH ONCE DAILY. Patient taking differently: Take 10 mg by mouth daily.  04/07/18   BurnMar Daring-C  mometasone (ELOCON) 0.1 % cream Apply 1 application topically daily. Patient taking differently: Apply 1  application topically daily as needed (rash).  06/24/18   BurnMar Daring-C  mometasone-formoterol (DULERA) 200-5 MCG/ACT AERO Inhale 2 puffs into the lungs 2 (two) times daily. Patient taking differently: Inhale 2 puffs into the lungs 2 (two) times daily as needed for wheezing or shortness of breath.  08/03/18   Burnette, JennClearnce Sorrel-C  montelukast (SINGULAIR) 10 MG tablet TAKE ONE TABLET BY MOUTH AT BEDTIME. Patient taking differently: Take 10 mg by mouth at bedtime.  05/26/18   BurnMar Daring-C  multivitamin (RENA-VIT) TABS tablet Take 1 tablet by mouth  daily.    [provider]  mupirocin ointment (BACTROBAN) 2 % APPLY INTO THE NOSTRILS TWICE DAILY AS DIRECTED. Patient taking differently: Apply 1 application topically daily as needed.  06/17/18   [name], PA-C  nitroGLYCERIN (NITROSTAT) 0.4 MG SL tablet Place 1 tablet (0.4 mg total) under the tongue every 5 (five) minutes x 3 doses as needed for chest pain. 03/19/17   Barrett, Evelene Croon, PA-C  omeprazole (PRILOSEC) 20 MG capsule Take 1 capsule (20 mg total) by mouth daily. 09/24/17   [name], PA-C  oseltamivir (TAMIFLU) 75 MG capsule Take 1 capsule (75 mg total) by mouth 2 (two) times daily for 5 days. 08/11/18 08/16/18  Paidyn Mcferran, Dannielle Karvonen, PA-C  pregabalin (LYRICA) 75 MG capsule Take 1 capsule (75 mg total) by mouth 2 (two) times daily. 07/25/18   Burnette, Jennifer M, PA-C  SENNA PLUS 8.6-50 MG tablet TAKE ONE TABLET BY MOUTH AT BEDTIME. Patient taking differently: Take 1 tablet by mouth at bedtime.  05/26/18   [name], PA-C  torsemide (DEMADEX) 20 MG tablet TAKE (2) TABLETS BY MOUTH TWICE DAILY. Patient taking differently: Take 40 mg by mouth 2 (two) times daily.  07/25/18   [name], PA-C  triamcinolone cream (KENALOG) 0.1 % APPLY TO AFFECTED AREA TWICE DAILY Patient taking differently: Apply 1 application topically daily as needed (itching).  06/17/18   [name], PA-C  valACYclovir (VALTREX) 500 MG tablet TAKE (1) TABLET BY MOUTH EVERY OTHER DAY. Patient taking differently: Take 500 mg by mouth every other day.  05/26/18   [name], PA-C  Vitamin D, Ergocalciferol, (DRISDOL) 50000 units CAPS capsule TAKE 1 CAPSULE BY MOUTH ONCE A MONTH Patient taking differently: Take 50,000 Units by mouth every 30 (thirty) days.  03/08/18   [name], PA-C  gabapentin (NEURONTIN) 100 MG capsule Take 1 capsule (100 mg total) by mouth 3 (three) times daily. 08/19/17 09/01/17  [name], MD    Allergies Shellfish allergy; Diazepam; and Morphine and related  Family History  Problem Relation Age of Onset  . Colon cancer Mother   . Heart attack Other   . Heart attack Maternal Grandmother   . Hypertension Sister   . Hypertension Brother   . Diabetes Paternal Grandmother   . Breast cancer Neg Hx     Social History Social History   Tobacco Use  . Smoking status: Former Smoker    Types: Cigarettes    Last attempt to quit: 10/25/1980    Years since quitting: 37.8  . Smokeless tobacco: Never Used  . Tobacco comment: quit smoking in 1982  Substance Use Topics  . Alcohol use: Yes    Comment: rarely  . Drug use: Not Currently    Types: "Crack" cocaine    Comment: 08/21/2016 "clean since 05/1998"    Review of Systems  Constitutional: Positive for fever and chills. Eyes: Negative for visual changes. ENT: Negative for sore throat. Cardiovascular: Negative for chest pain. Respiratory: Positive for shortness of breath and productive cough Gastrointestinal: Negative for abdominal pain, vomiting and diarrhea. Genitourinary: Negative for dysuria. Musculoskeletal: Negative for back pain. Skin: Negative for rash. Neurological: Negative for headaches, focal weakness or numbness. ____________________________________________  PHYSICAL EXAM:  VITAL SIGNS: ED Triage Vitals  Enc Vitals Group     BP 08/11/18 2013 (!) 112/46     Pulse  Rate 08/11/18 2013 86     Resp 08/11/18 2013 18     Temp 08/11/18  2013 99.5 F (37.5 C)     Temp Source 08/11/18 2013 Oral     SpO2 08/11/18 2013 94 %     Weight 08/11/18 2013 292 lb (132.5 kg)     Height 08/11/18 2013 5' 8"  (1.727 m)     Head Circumference --      Peak Flow --      Pain Score 08/11/18 2021 7     Pain Loc --      Pain Edu? --      Excl. in [place]? --     Constitutional: Alert and oriented. Well appearing and in no distress.  Patient sleeping in the room with multiple layers of clothing on. Head: Normocephalic and atraumatic. Eyes: Conjunctivae are normal. PERRL. Normal extraocular movements Ears: Canals clear. TMs intact bilaterally. Nose: No congestion/rhinorrhea/epistaxis. Mouth/Throat: Mucous membranes are moist. Cardiovascular: Normal rate, regular rhythm. Normal distal pulses. Respiratory: Normal respiratory effort. No wheezes/rales/rhonchi. Gastrointestinal: Soft and nontender. No distention. Musculoskeletal: Nontender with normal range of motion in all extremities.  Neurologic:  Normal gait without ataxia. Normal speech and language. No gross focal neurologic deficits are appreciated. ____________________________________________   LABS (pertinent positives/negatives) Labs Reviewed  INFLUENZA PANEL BY PCR (TYPE A & B) - Abnormal; Notable for the following components:      Result Value   Influenza A By PCR POSITIVE (*)    All other components within normal limits  ___________________________________________  PROCEDURES  Procedures DuoNeb x1 Azithromycin 500 mg p.o. ____________________________________________  INITIAL IMPRESSION / ASSESSMENT AND PLAN / ED COURSE  Patient with ED evaluation of symptoms concerning for influenza. Flu is confirmed.  She is 2 days into her course and is had a confirmed fluid exposure as well.  Patient's clinical picture is that of a patient with malaise and fatigue, concerning for an acute infectious process including  influenza and/or acute bronchitis.  Patient be treated empirically for influenza as well as a azithromycin for a potential pneumonia/bronchitis.  She will follow with primary provider or return to the ED as needed.  ____________________________________________  FINAL CLINICAL IMPRESSION(S) / ED DIAGNOSES  Final diagnoses:  Influenza      Carmie End, Dannielle Karvonen, PA-C 08/11/18 2140    Carrie Mew, MD 08/11/18 2302

## 2018-08-12 DIAGNOSIS — N186 End stage renal disease: Secondary | ICD-10-CM | POA: Diagnosis not present

## 2018-08-12 DIAGNOSIS — E1122 Type 2 diabetes mellitus with diabetic chronic kidney disease: Secondary | ICD-10-CM | POA: Diagnosis not present

## 2018-08-12 DIAGNOSIS — Z992 Dependence on renal dialysis: Secondary | ICD-10-CM | POA: Diagnosis not present

## 2018-08-13 DIAGNOSIS — N2581 Secondary hyperparathyroidism of renal origin: Secondary | ICD-10-CM | POA: Diagnosis not present

## 2018-08-13 DIAGNOSIS — E1122 Type 2 diabetes mellitus with diabetic chronic kidney disease: Secondary | ICD-10-CM | POA: Diagnosis not present

## 2018-08-13 DIAGNOSIS — N186 End stage renal disease: Secondary | ICD-10-CM | POA: Diagnosis not present

## 2018-08-13 DIAGNOSIS — D631 Anemia in chronic kidney disease: Secondary | ICD-10-CM | POA: Diagnosis not present

## 2018-08-13 DIAGNOSIS — D509 Iron deficiency anemia, unspecified: Secondary | ICD-10-CM | POA: Diagnosis not present

## 2018-08-13 DIAGNOSIS — Z23 Encounter for immunization: Secondary | ICD-10-CM | POA: Diagnosis not present

## 2018-08-13 DIAGNOSIS — Z992 Dependence on renal dialysis: Secondary | ICD-10-CM | POA: Diagnosis not present

## 2018-08-16 DIAGNOSIS — D631 Anemia in chronic kidney disease: Secondary | ICD-10-CM | POA: Diagnosis not present

## 2018-08-16 DIAGNOSIS — D509 Iron deficiency anemia, unspecified: Secondary | ICD-10-CM | POA: Diagnosis not present

## 2018-08-16 DIAGNOSIS — Z23 Encounter for immunization: Secondary | ICD-10-CM | POA: Diagnosis not present

## 2018-08-16 DIAGNOSIS — N186 End stage renal disease: Secondary | ICD-10-CM | POA: Diagnosis not present

## 2018-08-16 DIAGNOSIS — N2581 Secondary hyperparathyroidism of renal origin: Secondary | ICD-10-CM | POA: Diagnosis not present

## 2018-08-16 NOTE — Progress Notes (Deleted)
Patient: [name] Female    DOB: April 06, 1956   63 y.o.   MRN: [id number] Visit Date: 08/16/2018  Today's Provider: [name], PA-C   No chief complaint on file.  Subjective:     HPI   Follow Up ER Visit  Patient is here for ER follow up.  She was recently seen at [facility] for Influenza on 08/11/2018. Treatment for this included treatment for Influenza and Azithromycin for possible pneumonia/bronchitis. She reports {DESC; EXCELLENT/GOOD/FAIR:19992} compliance with treatment. She reports this condition is {improved/worse/unchanged:3041574}.  ------------------------------------------------------------------------------------   Allergies  Allergen Reactions  . Shellfish Allergy Anaphylaxis and Swelling  . Diazepam Other (See Comments)    "felt like out of body experience"  . Morphine And Related Itching     Current Outpatient Medications:  .  ACCU-CHEK SOFTCLIX LANCETS lancets, USE AS DIRECTED TO CHECK BLOOD SUGAR THREE TIMES DAILY AND AT BEDTIME, Disp: 400 each, Rfl: 1 .  albuterol (PROAIR HFA) 108 (90 Base) MCG/ACT inhaler, INHALE 2 PUFFS BY MOUTH EVERY 4 HOURS AS NEEDED FOR SHORTNESS OF BREATH (Patient taking differently: Inhale 2 puffs into the lungs every 4 (four) hours as needed for wheezing or shortness of breath. INHALE 2 PUFFS BY MOUTH EVERY 4 HOURS AS NEEDED FOR SHORTNESS OF BREATH), Disp: 8.5 g, Rfl: 5 .  allopurinol (ZYLOPRIM) 100 MG tablet, TAKE 1 TABLET BY MOUTH ONCE DAILY. (Patient taking differently: Take 100 mg by mouth daily. ), Disp: 90 tablet, Rfl: 1 .  aspirin EC 81 MG EC tablet, Take 1 tablet (81 mg total) by mouth daily., Disp: , Rfl:  .  atorvastatin (LIPITOR) 80 MG tablet, Take 80 mg by mouth daily., Disp: , Rfl:  .  azithromycin (ZITHROMAX Z-PAK) 250 MG tablet, Take 1 tablet (250 mg total) by mouth daily for 4 days., Disp: 4 each, Rfl: 0 .  benzonatate (TESSALON PERLES) 100 MG capsule, Take 1-2 tabs TID prn cough, Disp: 30 capsule, Rfl:  0 .  carvedilol (COREG) 25 MG tablet, Take 25 mg by mouth 2 (two) times daily with a meal., Disp: , Rfl:  .  docusate sodium (COLACE) 100 MG capsule, Take 1 capsule (100 mg total) by mouth 2 (two) times daily as needed for mild constipation. (Patient taking differently: Take 100 mg by mouth daily as needed for mild constipation. ), Disp: 30 capsule, Rfl: 0 .  fluticasone (FLONASE) 50 MCG/ACT nasal spray, Place 2 sprays into both nostrils daily as needed for allergies or rhinitis., Disp: 48 g, Rfl: 1 .  glucose blood (ACCU-CHEK GUIDE) test strip, To check blood sugar before meals and bedtime, Disp: 400 each, Rfl: 12 .  hydrALAZINE (APRESOLINE) 100 MG tablet, TAKE (1) TABLET BY MOUTH THREE TIMES DAILY (Patient taking differently: Take 100 mg by mouth 3 (three) times daily. ), Disp: 90 tablet, Rfl: 5 .  HYDROcodone-acetaminophen (NORCO/VICODIN) 5-325 MG tablet, Take 1 tablet by mouth every 8 (eight) hours as needed for moderate pain., Disp: 15 tablet, Rfl: 0 .  hydrOXYzine (ATARAX/VISTARIL) 25 MG tablet, TAKE (1) TABLET BY MOUTH EVERY SIX HOURS AS NEEDED FOR ITCHING. (Patient taking differently: Take 50 mg by mouth every 6 (six) hours as needed for itching. ), Disp: 120 tablet, Rfl: 0 .  insulin aspart (NOVOLOG FLEXPEN) 100 UNIT/ML FlexPen, Inject 25 Units into the skin 3 (three) times daily with meals., Disp: 15 mL, Rfl: 11 .  Insulin Pen Needle (PEN NEEDLES) 30G X 8 MM MISC, 1 each by Does not apply  route 3 (three) times daily before meals. AC for novolog injections, Disp: 100 each, Rfl: 11 .  isosorbide mononitrate (IMDUR) 30 MG 24 hr tablet, TAKE (3) TABLETS BY MOUTH ONCE DAILY. (Patient not taking: Reported on 08/04/2018), Disp: 90 tablet, Rfl: 1 .  Lancet Devices (ACCU-CHEK SOFTCLIX) lancets, Use as instructed daily., Disp: 1 each, Rfl: 5 .  LANTUS SOLOSTAR 100 UNIT/ML Solostar Pen, INJECT 60 UNITS S.Q. TWICE DAILY. (Patient taking differently: Inject 60 Units into the skin 2 (two) times daily. ),  Disp: 24 mL, Rfl: 11 .  liraglutide (VICTOZA) 18 MG/3ML SOPN, Start with 0.6mg  daily and increase by 0.6mg  per week until max dose of 1.8 mg dose achieved. (Patient taking differently: Inject 1.2 mg into the skin daily. Start with 0.6mg  daily and increase by 0.6mg  per week until max dose of 1.8 mg dose achieved.), Disp: 1 pen, Rfl: 6 .  loratadine (CLARITIN) 10 MG tablet, TAKE 1 TABLET BY MOUTH ONCE DAILY. (Patient taking differently: Take 10 mg by mouth daily. ), Disp: 90 tablet, Rfl: 1 .  mometasone (ELOCON) 0.1 % cream, Apply 1 application topically daily. (Patient taking differently: Apply 1 application topically daily as needed (rash). ), Disp: 45 g, Rfl: 0 .  mometasone-formoterol (DULERA) 200-5 MCG/ACT AERO, Inhale 2 puffs into the lungs 2 (two) times daily. (Patient taking differently: Inhale 2 puffs into the lungs 2 (two) times daily as needed for wheezing or shortness of breath. ), Disp: 13 g, Rfl: 5 .  montelukast (SINGULAIR) 10 MG tablet, TAKE ONE TABLET BY MOUTH AT BEDTIME. (Patient taking differently: Take 10 mg by mouth at bedtime. ), Disp: 90 tablet, Rfl: 1 .  multivitamin (RENA-VIT) TABS tablet, Take 1 tablet by mouth daily., Disp: , Rfl:  .  mupirocin ointment (BACTROBAN) 2 %, APPLY INTO THE NOSTRILS TWICE DAILY AS DIRECTED. (Patient taking differently: Apply 1 application topically daily as needed. ), Disp: 22 g, Rfl: 0 .  nitroGLYCERIN (NITROSTAT) 0.4 MG SL tablet, Place 1 tablet (0.4 mg total) under the tongue every 5 (five) minutes x 3 doses as needed for chest pain., Disp: 25 tablet, Rfl: 12 .  omeprazole (PRILOSEC) 20 MG capsule, Take 1 capsule (20 mg total) by mouth daily., Disp: 90 capsule, Rfl: 3 .  oseltamivir (TAMIFLU) 75 MG capsule, Take 1 capsule (75 mg total) by mouth 2 (two) times daily for 5 days., Disp: 10 capsule, Rfl: 0 .  pregabalin (LYRICA) 75 MG capsule, Take 1 capsule (75 mg total) by mouth 2 (two) times daily., Disp: 180 capsule, Rfl: 1 .  SENNA PLUS 8.6-50 MG  tablet, TAKE ONE TABLET BY MOUTH AT BEDTIME. (Patient taking differently: Take 1 tablet by mouth at bedtime. ), Disp: 90 tablet, Rfl: 1 .  torsemide (DEMADEX) 20 MG tablet, TAKE (2) TABLETS BY MOUTH TWICE DAILY. (Patient taking differently: Take 40 mg by mouth 2 (two) times daily. ), Disp: 120 tablet, Rfl: 5 .  triamcinolone cream (KENALOG) 0.1 %, APPLY TO AFFECTED AREA TWICE DAILY (Patient taking differently: Apply 1 application topically daily as needed (itching). ), Disp: 80 g, Rfl: 0 .  valACYclovir (VALTREX) 500 MG tablet, TAKE (1) TABLET BY MOUTH EVERY OTHER DAY. (Patient taking differently: Take 500 mg by mouth every other day. ), Disp: 45 tablet, Rfl: 1 .  Vitamin D, Ergocalciferol, (DRISDOL) 50000 units CAPS capsule, TAKE 1 CAPSULE BY MOUTH ONCE A MONTH (Patient taking differently: Take 50,000 Units by mouth every 30 (thirty) days. ), Disp: 3 capsule, Rfl: 3  Review  of Systems  Social History   Tobacco Use  . Smoking status: Former Smoker    Types: Cigarettes    Last attempt to quit: 10/25/1980    Years since quitting: 37.8  . Smokeless tobacco: Never Used  . Tobacco comment: quit smoking in 1982  Substance Use Topics  . Alcohol use: Yes    Comment: rarely      Objective:   There were no vitals taken for this visit. There were no vitals filed for this visit.   Physical Exam      Assessment & [place], PA-C  [facility]

## 2018-08-17 ENCOUNTER — Ambulatory Visit: Payer: Medicare Other | Admitting: Physician Assistant

## 2018-08-17 ENCOUNTER — Ambulatory Visit: Payer: Medicaid Other

## 2018-08-18 DIAGNOSIS — Z23 Encounter for immunization: Secondary | ICD-10-CM | POA: Diagnosis not present

## 2018-08-18 DIAGNOSIS — N2581 Secondary hyperparathyroidism of renal origin: Secondary | ICD-10-CM | POA: Diagnosis not present

## 2018-08-18 DIAGNOSIS — D631 Anemia in chronic kidney disease: Secondary | ICD-10-CM | POA: Diagnosis not present

## 2018-08-18 DIAGNOSIS — N186 End stage renal disease: Secondary | ICD-10-CM | POA: Diagnosis not present

## 2018-08-18 DIAGNOSIS — D509 Iron deficiency anemia, unspecified: Secondary | ICD-10-CM | POA: Diagnosis not present

## 2018-08-18 MED ORDER — SODIUM CHLORIDE 0.9 % IV SOLN
INTRAVENOUS | Status: DC
  Administered 2018-08-19: 08:00:00 via INTRAVENOUS

## 2018-08-18 MED ORDER — CEFAZOLIN SODIUM-DEXTROSE 1-4 GM/50ML-% IV SOLN
1.0000 g | INTRAVENOUS | Status: AC
  Administered 2018-08-19: 1 g via INTRAVENOUS

## 2018-08-18 NOTE — Pre-Procedure Instructions (Signed)
Patient called at 20:40.  Stated "could not find her pre-op instructions."  Went over the pre-op instructions that were in her chart.  Asked if had any questions?  Told her she may call back if any other concerns and/or questions.

## 2018-08-19 ENCOUNTER — Ambulatory Visit: Payer: Medicare Other | Admitting: Anesthesiology

## 2018-08-19 ENCOUNTER — Encounter
Admission: RE | Disposition: A | Discharge status: Home / Self Care | Payer: Self-pay | Source: Ambulatory Visit | Attending: Vascular Surgery

## 2018-08-19 ENCOUNTER — Ambulatory Visit
Admission: RE | Admit: 2018-08-19 | Discharge: 2018-08-19 | Disposition: A | Discharge status: Home / Self Care | Payer: Medicare Other | Source: Ambulatory Visit | Attending: Vascular Surgery | Admitting: Vascular Surgery

## 2018-08-19 ENCOUNTER — Other Ambulatory Visit: Payer: Self-pay

## 2018-08-19 DIAGNOSIS — E669 Obesity, unspecified: Secondary | ICD-10-CM | POA: Diagnosis not present

## 2018-08-19 DIAGNOSIS — Z87891 Personal history of nicotine dependence: Secondary | ICD-10-CM | POA: Diagnosis not present

## 2018-08-19 DIAGNOSIS — G4733 Obstructive sleep apnea (adult) (pediatric): Secondary | ICD-10-CM | POA: Diagnosis not present

## 2018-08-19 DIAGNOSIS — Z992 Dependence on renal dialysis: Secondary | ICD-10-CM | POA: Diagnosis not present

## 2018-08-19 DIAGNOSIS — Z955 Presence of coronary angioplasty implant and graft: Secondary | ICD-10-CM | POA: Insufficient documentation

## 2018-08-19 DIAGNOSIS — T82868A Thrombosis of vascular prosthetic devices, implants and grafts, initial encounter: Secondary | ICD-10-CM | POA: Diagnosis not present

## 2018-08-19 DIAGNOSIS — M79632 Pain in left forearm: Secondary | ICD-10-CM | POA: Diagnosis not present

## 2018-08-19 DIAGNOSIS — I132 Hypertensive heart and chronic kidney disease with heart failure and with stage 5 chronic kidney disease, or end stage renal disease: Secondary | ICD-10-CM | POA: Diagnosis not present

## 2018-08-19 DIAGNOSIS — J449 Chronic obstructive pulmonary disease, unspecified: Secondary | ICD-10-CM | POA: Diagnosis not present

## 2018-08-19 DIAGNOSIS — Z7982 Long term (current) use of aspirin: Secondary | ICD-10-CM | POA: Insufficient documentation

## 2018-08-19 DIAGNOSIS — K219 Gastro-esophageal reflux disease without esophagitis: Secondary | ICD-10-CM | POA: Insufficient documentation

## 2018-08-19 DIAGNOSIS — Z794 Long term (current) use of insulin: Secondary | ICD-10-CM | POA: Diagnosis not present

## 2018-08-19 DIAGNOSIS — Z6841 Body Mass Index (BMI) 40.0 and over, adult: Secondary | ICD-10-CM | POA: Insufficient documentation

## 2018-08-19 DIAGNOSIS — B1920 Unspecified viral hepatitis C without hepatic coma: Secondary | ICD-10-CM | POA: Diagnosis not present

## 2018-08-19 DIAGNOSIS — I5042 Chronic combined systolic (congestive) and diastolic (congestive) heart failure: Secondary | ICD-10-CM | POA: Insufficient documentation

## 2018-08-19 DIAGNOSIS — D631 Anemia in chronic kidney disease: Secondary | ICD-10-CM | POA: Diagnosis not present

## 2018-08-19 DIAGNOSIS — Z7951 Long term (current) use of inhaled steroids: Secondary | ICD-10-CM | POA: Diagnosis not present

## 2018-08-19 DIAGNOSIS — G8918 Other acute postprocedural pain: Secondary | ICD-10-CM | POA: Diagnosis not present

## 2018-08-19 DIAGNOSIS — I2510 Atherosclerotic heart disease of native coronary artery without angina pectoris: Secondary | ICD-10-CM | POA: Insufficient documentation

## 2018-08-19 DIAGNOSIS — M109 Gout, unspecified: Secondary | ICD-10-CM | POA: Diagnosis not present

## 2018-08-19 DIAGNOSIS — I252 Old myocardial infarction: Secondary | ICD-10-CM | POA: Diagnosis not present

## 2018-08-19 DIAGNOSIS — Y831 Surgical operation with implant of artificial internal device as the cause of abnormal reaction of the patient, or of later complication, without mention of misadventure at the time of the procedure: Secondary | ICD-10-CM | POA: Diagnosis not present

## 2018-08-19 DIAGNOSIS — E1122 Type 2 diabetes mellitus with diabetic chronic kidney disease: Secondary | ICD-10-CM | POA: Diagnosis not present

## 2018-08-19 DIAGNOSIS — N186 End stage renal disease: Secondary | ICD-10-CM | POA: Insufficient documentation

## 2018-08-19 HISTORY — PX: AV FISTULA PLACEMENT: SHX1204

## 2018-08-19 LAB — URINE DRUG SCREEN, QUALITATIVE (ARMC ONLY)
Amphetamines, Ur Screen: NOT DETECTED
Barbiturates, Ur Screen: NOT DETECTED
Benzodiazepine, Ur Scrn: NOT DETECTED
Cannabinoid 50 Ng, Ur ~~LOC~~: NOT DETECTED
Cocaine Metabolite,Ur ~~LOC~~: NOT DETECTED
MDMA (Ecstasy)Ur Screen: NOT DETECTED
Methadone Scn, Ur: NOT DETECTED
Opiate, Ur Screen: NOT DETECTED
Phencyclidine (PCP) Ur S: NOT DETECTED
Tricyclic, Ur Screen: POSITIVE — AB

## 2018-08-19 LAB — POCT I-STAT 4, (NA,K, GLUC, HGB,HCT)
Glucose, Bld: 400 mg/dL — ABNORMAL HIGH (ref 70–99)
HCT: 31 % — ABNORMAL LOW (ref 36.0–46.0)
Hemoglobin: 10.5 g/dL — ABNORMAL LOW (ref 12.0–15.0)
Potassium: 3.5 mmol/L (ref 3.5–5.1)
Sodium: 136 mmol/L (ref 135–145)

## 2018-08-19 LAB — GLUCOSE, CAPILLARY
Glucose-Capillary: 392 mg/dL — ABNORMAL HIGH (ref 70–99)
Glucose-Capillary: 266 mg/dL — ABNORMAL HIGH (ref 70–99)

## 2018-08-19 SURGERY — ARTERIOVENOUS (AV) FISTULA CREATION
Anesthesia: General | Laterality: Left

## 2018-08-19 MED ORDER — EVICEL 2 ML EX KIT
PACK | CUTANEOUS | Status: AC
  Filled 2018-08-19: qty 1

## 2018-08-19 MED ORDER — ROPIVACAINE HCL 5 MG/ML IJ SOLN
INTRAMUSCULAR | Status: DC | PRN
  Administered 2018-08-19: 10 mL
  Administered 2018-08-19: 20 mL via PERINEURAL

## 2018-08-19 MED ORDER — INSULIN ASPART 100 UNIT/ML ~~LOC~~ SOLN
15.0000 [IU] | Freq: Once | SUBCUTANEOUS | Status: AC
  Administered 2018-08-19: 15 [IU] via SUBCUTANEOUS

## 2018-08-19 MED ORDER — PAPAVERINE HCL 30 MG/ML IJ SOLN
INTRAMUSCULAR | Status: AC
  Filled 2018-08-19: qty 2

## 2018-08-19 MED ORDER — FENTANYL CITRATE (PF) 100 MCG/2ML IJ SOLN
INTRAMUSCULAR | Status: DC | PRN
  Administered 2018-08-19 (×2): 50 ug via INTRAVENOUS

## 2018-08-19 MED ORDER — BUPIVACAINE LIPOSOME 1.3 % IJ SUSP
INTRAMUSCULAR | Status: DC | PRN
  Administered 2018-08-19: 40 mL

## 2018-08-19 MED ORDER — ONDANSETRON HCL 4 MG/2ML IJ SOLN: 4.0000 mg | Freq: Once | INTRAMUSCULAR | Status: DC | PRN

## 2018-08-19 MED ORDER — LIDOCAINE HCL (PF) 1 % IJ SOLN
INTRAMUSCULAR | Status: DC | PRN
  Administered 2018-08-19: 3 mL

## 2018-08-19 MED ORDER — BUPIVACAINE LIPOSOME 1.3 % IJ SUSP
INTRAMUSCULAR | Status: AC
  Filled 2018-08-19: qty 20

## 2018-08-19 MED ORDER — FENTANYL CITRATE (PF) 100 MCG/2ML IJ SOLN
50.0000 ug | Freq: Once | INTRAMUSCULAR | Status: AC
  Administered 2018-08-19: 50 ug via INTRAVENOUS

## 2018-08-19 MED ORDER — MIDAZOLAM HCL 2 MG/2ML IJ SOLN
1.0000 mg | Freq: Once | INTRAMUSCULAR | Status: AC
  Administered 2018-08-19: 1 mg via INTRAVENOUS

## 2018-08-19 MED ORDER — FENTANYL CITRATE (PF) 100 MCG/2ML IJ SOLN: 25.0000 ug | INTRAMUSCULAR | Status: DC | PRN

## 2018-08-19 MED ORDER — LIDOCAINE HCL (PF) 1 % IJ SOLN
INTRAMUSCULAR | Status: AC
  Filled 2018-08-19: qty 5

## 2018-08-19 MED ORDER — HEPARIN SODIUM (PORCINE) 5000 UNIT/ML IJ SOLN
INTRAMUSCULAR | Status: AC
  Filled 2018-08-19: qty 1

## 2018-08-19 MED ORDER — OXYCODONE-ACETAMINOPHEN 5-325 MG PO TABS: 1.0000 | ORAL_TABLET | Freq: Four times a day (QID) | ORAL | 0 refills | Status: DC | PRN

## 2018-08-19 MED ORDER — INSULIN ASPART 100 UNIT/ML ~~LOC~~ SOLN
SUBCUTANEOUS | Status: AC
  Filled 2018-08-19: qty 1

## 2018-08-19 MED ORDER — EVICEL 5 ML EX KIT
PACK | CUTANEOUS | Status: AC
  Filled 2018-08-19: qty 1

## 2018-08-19 MED ORDER — MIDAZOLAM HCL 2 MG/2ML IJ SOLN
INTRAMUSCULAR | Status: AC
  Filled 2018-08-19: qty 2

## 2018-08-19 MED ORDER — PROPOFOL 500 MG/50ML IV EMUL
INTRAVENOUS | Status: DC | PRN
  Administered 2018-08-19: 25 ug/kg/min via INTRAVENOUS

## 2018-08-19 MED ORDER — FENTANYL CITRATE (PF) 100 MCG/2ML IJ SOLN
INTRAMUSCULAR | Status: AC
  Filled 2018-08-19: qty 2

## 2018-08-19 MED ORDER — SODIUM CHLORIDE 0.9 % IV SOLN
INTRAVENOUS | Status: DC | PRN
  Administered 2018-08-19: 25 mL via INTRAMUSCULAR

## 2018-08-19 MED ORDER — ROPIVACAINE HCL 5 MG/ML IJ SOLN
INTRAMUSCULAR | Status: AC
  Filled 2018-08-19: qty 30

## 2018-08-19 MED ORDER — BUPIVACAINE HCL (PF) 0.5 % IJ SOLN
INTRAMUSCULAR | Status: AC
  Filled 2018-08-19: qty 30

## 2018-08-19 SURGICAL SUPPLY — 64 items
ADH SKN CLS APL DERMABOND .7 (GAUZE/BANDAGES/DRESSINGS) ×1
APPLIER CLIP 11 MED OPEN (CLIP)
APPLIER CLIP 9.375 SM OPEN (CLIP)
APR CLP MED 11 20 MLT OPN (CLIP)
APR CLP SM 9.3 20 MLT OPN (CLIP)
BAG DECANTER FOR FLEXI CONT (MISCELLANEOUS) ×2 IMPLANT
BLADE SURG SZ11 CARB STEEL (BLADE) ×2 IMPLANT
BOOT SUTURE AID YELLOW STND (SUTURE) ×2 IMPLANT
BRUSH SCRUB EZ  4% CHG (MISCELLANEOUS) ×1
BRUSH SCRUB EZ 4% CHG (MISCELLANEOUS) ×1 IMPLANT
CANISTER SUCT 1200ML W/VALVE (MISCELLANEOUS) ×2 IMPLANT
CHLORAPREP W/TINT 26ML (MISCELLANEOUS) ×2 IMPLANT
CLIP APPLIE 11 MED OPEN (CLIP) IMPLANT
CLIP APPLIE 9.375 SM OPEN (CLIP) IMPLANT
COVER WAND RF STERILE (DRAPES) ×2 IMPLANT
DERMABOND ADVANCED (GAUZE/BANDAGES/DRESSINGS) ×1
DERMABOND ADVANCED .7 DNX12 (GAUZE/BANDAGES/DRESSINGS) ×1 IMPLANT
DRESSING SURGICEL FIBRLLR 1X2 (HEMOSTASIS) ×1 IMPLANT
DRSG SURGICEL FIBRILLAR 1X2 (HEMOSTASIS) ×2
ELECT CAUTERY BLADE 6.4 (BLADE) ×2 IMPLANT
ELECT REM PT RETURN 9FT ADLT (ELECTROSURGICAL) ×2
ELECTRODE REM PT RTRN 9FT ADLT (ELECTROSURGICAL) ×1 IMPLANT
GEL ULTRASOUND 20GR AQUASONIC (MISCELLANEOUS) IMPLANT
GLOVE BIO SURGEON STRL SZ7 (GLOVE) ×2 IMPLANT
GLOVE INDICATOR 7.5 STRL GRN (GLOVE) ×2 IMPLANT
GLOVE SURG SYN 8.0 (GLOVE) ×2 IMPLANT
GLOVE SURG SYN 8.0 PF PI (GLOVE) ×1 IMPLANT
GOWN STRL REUS W/ TWL LRG LVL3 (GOWN DISPOSABLE) ×2 IMPLANT
GOWN STRL REUS W/ TWL XL LVL3 (GOWN DISPOSABLE) ×1 IMPLANT
GOWN STRL REUS W/TWL LRG LVL3 (GOWN DISPOSABLE) ×4
GOWN STRL REUS W/TWL XL LVL3 (GOWN DISPOSABLE) ×2
IV NS 500ML (IV SOLUTION) ×2
IV NS 500ML BAXH (IV SOLUTION) ×1 IMPLANT
KIT TURNOVER KIT A (KITS) ×2 IMPLANT
LABEL OR SOLS (LABEL) ×2 IMPLANT
LOOP RED MAXI  1X406MM (MISCELLANEOUS) ×1
LOOP VESSEL MAXI 1X406 RED (MISCELLANEOUS) ×1 IMPLANT
LOOP VESSEL MINI 0.8X406 BLUE (MISCELLANEOUS) ×2 IMPLANT
LOOPS BLUE MINI 0.8X406MM (MISCELLANEOUS) ×2
NDL FILTER BLUNT 18X1 1/2 (NEEDLE) ×1 IMPLANT
NDL HYPO 30X.5 LL (NEEDLE) IMPLANT
NEEDLE FILTER BLUNT 18X 1/2SAF (NEEDLE) ×1
NEEDLE FILTER BLUNT 18X1 1/2 (NEEDLE) ×1 IMPLANT
NEEDLE HYPO 30X.5 LL (NEEDLE) IMPLANT
NS IRRIG 500ML POUR BTL (IV SOLUTION) ×2 IMPLANT
PACK EXTREMITY ARMC (MISCELLANEOUS) ×2 IMPLANT
PAD PREP 24X41 OB/GYN DISP (PERSONAL CARE ITEMS) ×2 IMPLANT
PUNCH SURGICAL ROTATE 2.7MM (MISCELLANEOUS) IMPLANT
STOCKINETTE 48X4 2 PLY STRL (GAUZE/BANDAGES/DRESSINGS) ×1 IMPLANT
STOCKINETTE STRL 4IN 9604848 (GAUZE/BANDAGES/DRESSINGS) ×2 IMPLANT
SUT MNCRL+ 5-0 UNDYED PC-3 (SUTURE) ×1 IMPLANT
SUT MONOCRYL 5-0 (SUTURE) ×1
SUT PROLENE 6 0 BV (SUTURE) ×8 IMPLANT
SUT SILK 2 0 (SUTURE) ×2
SUT SILK 2-0 18XBRD TIE 12 (SUTURE) ×1 IMPLANT
SUT SILK 3 0 (SUTURE) ×2
SUT SILK 3-0 18XBRD TIE 12 (SUTURE) ×1 IMPLANT
SUT SILK 4 0 (SUTURE) ×2
SUT SILK 4-0 18XBRD TIE 12 (SUTURE) ×1 IMPLANT
SUT VIC AB 3-0 SH 27 (SUTURE) ×2
SUT VIC AB 3-0 SH 27X BRD (SUTURE) ×1 IMPLANT
SYR 20CC LL (SYRINGE) ×2 IMPLANT
SYR 3ML LL SCALE MARK (SYRINGE) ×2 IMPLANT
TOWEL OR 17X26 4PK STRL BLUE (TOWEL DISPOSABLE) IMPLANT

## 2018-08-19 NOTE — Anesthesia Post-op Follow-up Note (Signed)
Anesthesia QCDR form completed.        

## 2018-08-19 NOTE — Op Note (Signed)
OPERATIVE NOTE   PROCEDURE: Left brachial basilic arteriovenous fistula placement  PRE-OPERATIVE DIAGNOSIS: End Stage Renal Disease  POST-OPERATIVE DIAGNOSIS: End Stage Renal Disease  SURGEON: Hortencia Pilar  ASSISTANT(S): Ms. Hezzie Bump  ANESTHESIA: Regional with sedation  ESTIMATED BLOOD LOSS: <50 cc  FINDING(S): 4 mm basilic vein  SPECIMEN(S):  none  INDICATIONS:   [name] is a 63 y.o. female who presents with end stage renal disease.  The patient is scheduled for left basilic arteriovenous fistula creation as the first stage of a basilic transposition.  The patient is aware the risks include but are not limited to: bleeding, infection, steal syndrome, nerve damage, ischemic monomelic neuropathy, failure to mature, and need for additional procedures.  The patient is aware of the risks of the procedure and elects to proceed forward.  DESCRIPTION: After full informed written consent was obtained from the patient, the patient was brought back to the operating room and placed supine upon the operating table.  Prior to induction, the patient received IV antibiotics.   After obtaining adequate anesthesia, the patient was then prepped and draped in the standard fashion for a left arm access procedure.   A first assistant was required to provide a safe and appropriate environment for executing the surgery.  The assistant was integral in providing retraction, exposure, running suture providing suction and in the closing process.   A curvilinear incision was then created midway between the brachial impulse and the basilic vein at the level of the antecubital fossa. The basilic vein was then identified and dissected circumferentially. It was marked with a surgical marker.    Attention was then turned to the brachial artery which was exposed through the same incision and looped proximally and distally. Side branches were controlled with 4-0 silk ties.  The distal segment  of the vein was ligated with a  2-0 silk, and the vein was transected.  The proximal segment was interrogated with [facility] coronary dilators.  The vein accepted a 4 mm dilator without any difficulty. Heparinized saline was infused into the vein and clamped it with a small bulldog.  At this point, the exposure of the brachial artery was then reset and controlled the artery was obtained with vessel loops proximally and distally.  An arteriotomy was then made with a #11 blade, and extended with a Potts scissor.  Heparinized saline was injected proximal and distal into the brachial artery.  The vein was then approximated to the artery while the artery was in its native bed and subsequently the vein was beveled using Potts scissors. The vein was then sewn to the artery in an end-to-side configuration with a running stitch of 6-0 Prolene.  Prior to completing this anastomosis Flushing maneuvers were performed and the artery was allowed to forward and back bleed.  There was no evidence of clot from any vessels.  The anastomosis was completed in the usual fashion and then the vessel loops were released.    There was good  thrill in the venous outflow, and there was 1+ palpable radial pulse.  At this point, the wound was irrigated.  There was no further active bleeding.  The subcutaneous tissue was reapproximated with a running stitch of 3-0 Vicryl.  The skin was then reapproximated with a running subcuticular stitch of 4-0 Vicryl.  The skin was then cleaned, dried, and reinforced with Dermabond.    The patient tolerated this procedure well.   COMPLICATIONS: None  CONDITION: [name]  [facility]  Office: [phone]   08/19/2018, 9:30 AM

## 2018-08-19 NOTE — H&P (Signed)
[facility] Admission History & Physical  MRN : [id number]  [name] is a 63 y.o. (12/27/1955) female who presents with chief complaint of No chief complaint on file. [name]  History of Present Illness:    The patient is seen for evaluation of dialysis access.  The patient has a history of starting dialysis in November 2019.  There have not been any accesses in the arms or in the thighs.    Current access is via a catheter which is functioning adequately but she not it hurts at the insertion site.  There have not been several episodes of catheter infection.  No fever or chills.  The patient denies amaurosis fugax or recent TIA symptoms. There are no recent neurological changes noted.  The patient denies claudication symptoms or rest pain symptoms.  The patient denies history of DVT, PE or superficial thrombophlebitis. The patient denies recent episodes of angina or shortness of breath.    Current Facility-Administered Medications  Medication Dose Route Frequency Provider Last Rate Last Dose  . 0.9 %  sodium chloride infusion   Intravenous Continuous Martha Clan, MD      . ceFAZolin (ANCEF) IVPB 1 g/50 mL premix  1 g Intravenous On Call to OR Kris Hartmann, NP      . fentaNYL (SUBLIMAZE) 100 MCG/2ML injection           . insulin aspart (novoLOG) 100 UNIT/ML injection           . lidocaine (PF) (XYLOCAINE) 1 % injection           . midazolam (VERSED) 2 MG/2ML injection           . ropivacaine (PF) 5 mg/mL (0.5%) (NAROPIN) 5 MG/ML injection             Past Medical History:  Diagnosis Date  . Anemia   . Aortic stenosis    Echo 8/18: mean 13, peak 28, LVOT/AV mean velocity 0.51  . Arthritis   . Asthma    As a child   . Bronchitis   . CAD (coronary artery disease)    a. 09/2016: 50% Ost 1st Mrg stenosis, 50% 2nd Mrg stenosis, 20% Mid-Cx, 95% Prox LAD, 40% mid-LAD, and 10% dist-LAD stenosis. Staged PCI with DES to Prox-LAD.   [name] Chronic combined  systolic and diastolic CHF (congestive heart failure) ([place]) 2011   echo 2/18: EF 55-60, normal wall motion, grade 2 diastolic dysfunction, trivial AI // echo 3/18: Septal and apical HK, EF 45-50, normal wall motion, trivial AI, mild LAE, PASP 38 // echo 8/18: EF 60-65, normal wall motion, grade 1 diastolic dysfunction, calcified aortic valve leaflets, mild aortic stenosis (mean 13, peak 28, LVOT/AV mean velocity 0.51), mild AI, moderate MAC, mild LAE, trivial TR   . Chronic kidney disease    STAGE 4  . Depression   . Diabetes mellitus Dx 1989  . GERD (gastroesophageal reflux disease)   . Gout   . Hepatitis C Dx 2013  . Hypertension Dx 1989  . Myocardial infarction ([place]) 07/2015  . Obesity   . Obstructive sleep apnea   . Pancreatitis 2013  . Pneumonia   . Refusal of blood transfusions as patient is Jehovah's Witness   . Tendinitis   . Tremors of nervous system    LEFT HAND  . Ulcer 2010    Past Surgical History:  Procedure Laterality Date  . APPLICATION OF WOUND VAC Left 08/14/2017   Procedure: APPLICATION OF WOUND  VAC Exchange;  Surgeon: Robert Bellow, MD;  Location: [facility];  Service: General;  Laterality: Left;  . CHOLECYSTECTOMY    . COLONOSCOPY WITH PROPOFOL N/A 02/03/2018   Procedure: COLONOSCOPY WITH PROPOFOL;  Surgeon: Lin Landsman, MD;  Location: [facility];  Service: Gastroenterology;  Laterality: N/A;  . CORONARY ANGIOPLASTY  07/2015   STENT  . CORONARY STENT INTERVENTION N/A 09/18/2016   Procedure: Coronary Stent Intervention;  Surgeon: Troy Sine, MD;  Location: [place] [facility];  Service: Cardiovascular;  Laterality: N/A;  . DIALYSIS/PERMA CATHETER INSERTION N/A 05/10/2018   Procedure: DIALYSIS/PERMA CATHETER INSERTION;  Surgeon: Katha Cabal, MD;  Location: [place] [facility];  Service: Cardiovascular;  Laterality: N/A;  . DRESSING CHANGE UNDER ANESTHESIA Left 08/15/2017   Procedure: exploration of wound for bleeding;  Surgeon: Robert Bellow, MD;  Location: [facility];  Service: General;  Laterality: Left;  . ESOPHAGOGASTRODUODENOSCOPY (EGD) WITH PROPOFOL N/A 02/03/2018   Procedure: ESOPHAGOGASTRODUODENOSCOPY (EGD) WITH PROPOFOL;  Surgeon: Lin Landsman, MD;  Location: [facility] ENDOSCOPY;  Service: Gastroenterology;  Laterality: N/A;  . EYE SURGERY    . INCISION AND DRAINAGE ABSCESS Left 08/12/2017   Procedure: INCISION AND DRAINAGE ABSCESS;  Surgeon: Robert Bellow, MD;  Location: [facility] OR[facility];  Service: General;  Laterality: Left;  . KNEE ARTHROSCOPY    . LEFT HEART CATH N/A 09/18/2016   Procedure: Left Heart Cath;  Surgeon: Troy Sine, MD;  Location: [place] [facility];  Service: Cardiovascular;  Laterality: N/A;  . LEFT HEART CATH AND CORONARY ANGIOGRAPHY N/A 09/16/2016   Procedure: Left Heart Cath and Coronary Angiography;  Surgeon: Burnell Blanks, MD;  Location: [place] [facility];  Service: Cardiovascular;  Laterality: N/A;  . LEFT HEART CATH AND CORONARY ANGIOGRAPHY N/A 04/29/2017   Procedure: LEFT HEART CATH AND CORONARY ANGIOGRAPHY;  Surgeon: Nelva Bush, MD;  Location: [place] [facility];  Service: Cardiovascular;  Laterality: N/A;  . LOWER EXTREMITY ANGIOGRAPHY Right 03/08/2018   Procedure: LOWER EXTREMITY ANGIOGRAPHY;  Surgeon: Katha Cabal, MD;  Location: [place] [facility];  Service: Cardiovascular;  Laterality: Right;  . TUBAL LIGATION    . TUBAL LIGATION      Social History Social History   Tobacco Use  . Smoking status: Former Smoker    Types: Cigarettes    Last attempt to quit: 10/25/1980    Years since quitting: 37.8  . Smokeless tobacco: Never Used  . Tobacco comment: quit smoking in 1982  Substance Use Topics  . Alcohol use: Yes    Comment: rarely  . Drug use: Not Currently    Types: "Crack" cocaine    Comment: 08/21/2016 "clean since 05/1998"    Family History Family History  Problem Relation Age of Onset  . Colon cancer Mother   . Heart attack Other   . Heart  attack Maternal Grandmother   . Hypertension Sister   . Hypertension Brother   . Diabetes Paternal Grandmother   . Breast cancer Neg Hx     No family history of bleeding or clotting disorders, autoimmune disease or porphyria  Allergies  Allergen Reactions  . Shellfish Allergy Anaphylaxis and Swelling  . Diazepam Other (See Comments)    "felt like out of body experience"  . Morphine And Related Itching     REVIEW OF SYSTEMS (Negative unless checked)  Constitutional: _0 Weight loss  _1 Fever  _2 Chills Cardiac: _3 Chest pain   _4 Chest pressure   _5 Palpitations   _6 Shortness of breath when  laying flat   _0 Shortness of breath at rest   _1 Shortness of breath with exertion. Vascular:  _2 Pain in legs with walking   _3 Pain in legs at rest   _4 Pain in legs when laying flat   _5 Claudication   _6 Pain in feet when walking  _7 Pain in feet at rest  _8 Pain in feet when laying flat   _9 History of DVT   _10 Phlebitis   _11 Swelling in legs   _12 Varicose veins   _13 Non-healing ulcers Pulmonary:   _14 Uses home oxygen   _15 Productive cough   _16 Hemoptysis   _17 Wheeze  _18 COPD   _19 Asthma Neurologic:  _20 Dizziness  _21 Blackouts   _22 Seizures   _23 History of stroke   _24 History of TIA  _25 Aphasia   _26 Temporary blindness   _27 Dysphagia   _28 Weakness or numbness in arms   _29 Weakness or numbness in legs Musculoskeletal:  _30 Arthritis   _31 Joint swelling   _32 Joint pain   _33 Low back pain Hematologic:  _34 Easy bruising  _35 Easy bleeding   _36 Hypercoagulable state   _37 Anemic  _38 Hepatitis Gastrointestinal:  _39 Blood in stool   _40 Vomiting blood  _41 Gastroesophageal reflux/heartburn   _42 Difficulty swallowing. Genitourinary:  _43 Chronic kidney disease   _44 Difficult urination  _45 Frequent urination  _46 Burning with urination   _47 Blood in urine Skin:  _48 Rashes   _49 Ulcers   _50 Wounds Psychological:  _51 History of anxiety   _52  History of major depression.  Physical Examination  Vitals:   08/19/18 0648 08/19/18 0719 08/19/18 0724  BP: (!) 140/54 (!)  149/60 (!) 146/59  Pulse: 80 80 80  Resp: _53 Temp: (!) 97.1 F (36.2 C)    TempSrc: Tympanic    SpO2: 94% 98% 99%  Weight: 131.1 kg    Height: _54  (1.727 m)     Body mass index is 43.94 kg/m. Gen: WD/WN, NAD Head: [place]/AT, No temporalis wasting. Prominent temp pulse not noted. Ear/Nose/Throat: Hearing grossly intact, nares w/o erythema or drainage, oropharynx w/o Erythema/Exudate,  Eyes: Conjunctiva clear, sclera non-icteric Neck: Trachea midline.  No JVD.  Pulmonary:  Good air movement, respirations not labored, no use of accessory muscles.  Cardiac: RRR, normal S1, S2. Vascular: right IJ catheter intact no redness no drainage Vessel Right Left  Radial Palpable Palpable  Ulnar Not Palpable Not Palpable  Brachial Palpable Palpable  Carotid Palpable, without bruit Palpable, without bruit  Gastrointestinal: soft, non-tender/non-distended. No guarding/reflex.  Musculoskeletal: M/S 5/5 throughout.  Extremities without ischemic changes.  No deformity or atrophy.  Neurologic: Sensation grossly intact in extremities.  Symmetrical.  Speech is fluent. Motor exam as listed above. Psychiatric: Judgment intact, Mood & affect appropriate for pt's clinical situation. Dermatologic: No rashes or ulcers noted.  No cellulitis or open wounds. Lymph : No Cervical, Axillary, or Inguinal lymphadenopathy.   CBC Lab Results  Component Value Date   WBC 6.0 08/10/2018   HGB 10.5 (L) 08/19/2018   HCT 31.0 (L) 08/19/2018   MCV 92.3 08/10/2018   PLT 200 08/10/2018    BMET    Component Value Date/Time   NA 136 08/19/2018 0656   NA 142 01/27/2018 1353   K 3.5 08/19/2018 0656   CL 97 (L) 08/10/2018 1208   CO2 29 08/10/2018 1208   GLUCOSE 400 (H) 08/19/2018 0656   BUN 42 (H) 08/10/2018 1208   BUN 82 (HH) 01/27/2018 1353   CREATININE 3.57 (H) 08/10/2018 1208   CREATININE 1.36 (H) 09/23/2016 1451   CALCIUM 7.9 (L) 08/10/2018 1208   GFRNONAA 13 (L) 08/10/2018 1208   GFRNONAA 43 (L)  09/23/2016 1451   GFRAA 15 (L) 08/10/2018 1208   GFRAA 49 (L) 09/23/2016 1451   Estimated Creatinine Clearance: 24 mL/min (A) (by C-G formula based on SCr of 3.57 mg/dL (H)).  COAG Lab Results  Component Value Date   INR 1.02 08/10/2018   INR 1.02 12/14/2017   INR 1.01 10/03/2017    Radiology No results found.  Assessment/Plan 1.  Complication dialysis device with thrombosis AV access:  Recommend:  At this time the patient does not have appropriate extremity access for dialysis  Patient should have a  Left upper arm av fistula created.  The risks, benefits and alternative therapies were reviewed in detail with the patient.  All questions were answered.  The patient agrees to proceed with surgery.   2.  End-stage renal disease requiring hemodialysis:  Patient will continue dialysis therapy without further interruption if a successful intervention is not achieved then a tunneled catheter will be placed. Dialysis has already been arranged.  3.  Hypertension:  Patient will continue medical management; nephrology is following no changes in oral medications.  4. Diabetes mellitus:  Glucose will be monitored and oral medications been held this morning once the patient has undergone the patient's procedure po intake will be reinitiated and again Accu-Cheks will be used to assess the blood glucose level and treat as needed. The patient will be restarted on the patient's usual hypoglycemic regime  5.  Coronary artery disease:  EKG will be monitored. Nitrates will be used if needed. The patient's oral cardiac medications will be continued.    Hortencia Pilar, MD  08/19/2018 7:42 AM

## 2018-08-19 NOTE — Anesthesia Preprocedure Evaluation (Signed)
Anesthesia Evaluation  Patient identified by MRN, date of birth, ID band Patient awake    Reviewed: Allergy & Precautions, NPO status , Patient's Chart, lab work & pertinent test results  History of Anesthesia Complications Negative for: history of anesthetic complications  Airway Mallampati: III  TM Distance: >3 FB Neck ROM: Full    Dental  (+) Poor Dentition, Missing   Pulmonary asthma , neg sleep apnea, COPD,  COPD inhaler, former smoker,    breath sounds clear to auscultation- rhonchi (-) wheezing      Cardiovascular hypertension, + CAD, + Past MI, + Cardiac Stents and +CHF   Rhythm:Regular Rate:Normal - Systolic murmurs and - Diastolic murmurs Echo 10/01/00: - Left ventricle: The cavity size was normal. Wall thickness was   increased in a pattern of moderate LVH. Systolic function was   normal. The estimated ejection fraction was in the range of 60%   to 65%. Doppler parameters are consistent with abnormal left   ventricular relaxation (grade 1 diastolic dysfunction). - Aortic valve: There was mild to moderate stenosis. There was   trivial regurgitation. Valve area (VTI): 2.07 cm^2. Valve area   (Vmax): 1.69 cm^2. Valve area (Vmean): 1.67 cm^2. - Mitral valve: Calcified annulus. - Left atrium: The atrium was mildly dilated. - Atrial septum: No defect or patent foramen ovale was identified.   Neuro/Psych neg Seizures PSYCHIATRIC DISORDERS Depression negative neurological ROS     GI/Hepatic GERD  ,(+) Hepatitis -, C  Endo/Other  diabetes, Insulin Dependent  Renal/GU ESRF and DialysisRenal disease     Musculoskeletal  (+) Arthritis ,   Abdominal (+) + obese,   Peds  Hematology  (+) anemia ,   Anesthesia Other Findings Past Medical History: No date: Anemia No date: Aortic stenosis     Comment:  Echo 8/18: mean 13, peak 28, LVOT/AV mean velocity 0.51 No date: Arthritis No date: Asthma     Comment:  As a  child  No date: Bronchitis No date: CAD (coronary artery disease)     Comment:  a. 09/2016: 50% Ost 1st Mrg stenosis, 50% 2nd Mrg               stenosis, 20% Mid-Cx, 95% Prox LAD, 40% mid-LAD, and 10%               dist-LAD stenosis. Staged PCI with DES to Prox-LAD.  2011: Chronic combined systolic and diastolic CHF (congestive heart  failure) ([facility])     Comment:  echo 2/18: EF 55-60, normal wall motion, grade 2               diastolic dysfunction, trivial AI // echo 3/18: Septal               and apical HK, EF 45-50, normal wall motion, trivial AI,               mild LAE, PASP 38 // echo 8/18: EF 60-65, normal wall               motion, grade 1 diastolic dysfunction, calcified aortic               valve leaflets, mild aortic stenosis (mean 13, peak 28,               LVOT/AV mean velocity 0.51), mild AI, moderate MAC, mild               LAE, trivial TR  No date: Chronic kidney disease  Comment:  STAGE 4 No date: Depression Dx 1989: Diabetes mellitus No date: GERD (gastroesophageal reflux disease) No date: Gout Dx 2013: Hepatitis C Dx 1989: Hypertension 07/2015: Myocardial infarction ([place]) No date: Obesity No date: Obstructive sleep apnea 2013: Pancreatitis No date: Pneumonia No date: Refusal of blood transfusions as patient is Jehovah's Witness No date: Tendinitis No date: Tremors of nervous system     Comment:  LEFT HAND 2010: Ulcer   Reproductive/Obstetrics                             Anesthesia Physical Anesthesia Plan  ASA: IV  Anesthesia Plan: General   Post-op Pain Management:  Regional for Post-op pain   Induction: Intravenous  PONV Risk Score and Plan: 2 and Propofol infusion  Airway Management Planned: Natural Airway  Additional Equipment:   Intra-op Plan:   Post-operative Plan:   Informed Consent: I have reviewed the patients History and Physical, chart, labs and discussed the procedure including the risks, benefits and  alternatives for the proposed anesthesia with the patient or authorized representative who has indicated his/her understanding and acceptance.     Dental advisory given  Plan Discussed with: CRNA and Anesthesiologist  Anesthesia Plan Comments:         Anesthesia Quick Evaluation

## 2018-08-19 NOTE — Transfer of Care (Signed)
Immediate Anesthesia Transfer of Care Note  Patient: [name]  Procedure(s) Performed: ARTERIOVENOUS (AV) FISTULA CREATION ( BRACHIOBASILIC ) (Left )  Patient Location: PACU  Anesthesia Type:General and Regional  Level of Consciousness: awake, alert  and oriented  Airway & Oxygen Therapy: Patient Spontanous Breathing and Patient connected to nasal cannula oxygen  Post-op Assessment: Report given to RN and Post -op Vital signs reviewed and stable  Post vital signs: Reviewed and stable  Last Vitals:  Vitals Value Taken Time  BP    Temp    Pulse    Resp    SpO2      Last Pain:  Vitals:   08/19/18 0724  TempSrc:   PainSc: 0-No pain         Complications: No apparent anesthesia complications

## 2018-08-19 NOTE — Anesthesia Procedure Notes (Signed)
Anesthesia Regional Block: Supraclavicular block   Pre-Anesthetic Checklist: ,, timeout performed, Correct Patient, Correct Site, Correct Laterality, Correct Procedure, Correct Position, site marked, Risks and benefits discussed,  Surgical consent,  Pre-op evaluation,  At surgeon's request and post-op pain management  Laterality: Left  Prep: chloraprep       Needles:  Injection technique: Single-shot  Needle Type: Stimiplex     Needle Length: 10cm  Needle Gauge: 21     Additional Needles:   Procedures:,,,, ultrasound used (permanent image in chart),,,,  Narrative:  Start time: 08/19/2018 7:19 AM End time: 08/19/2018 7:23 AM Injection made incrementally with aspirations every 5 mL.  Performed by: Personally  Anesthesiologist: Emmie Niemann, MD  Additional Notes: Functioning IV was confirmed and monitors were applied.  A Stimuplex needle was used. Sterile prep and drape,hand hygiene and sterile gloves were used.  Negative aspiration and negative test dose prior to incremental administration of local anesthetic. The patient tolerated the procedure well.  Supplemental intercostal brachial block also performed

## 2018-08-19 NOTE — Anesthesia Postprocedure Evaluation (Signed)
Anesthesia Post Note  Patient: Blenda Wisecup Samudio  Procedure(s) Performed: ARTERIOVENOUS (AV) FISTULA CREATION ( BRACHIOBASILIC ) (Left )  Patient location during evaluation: PACU Anesthesia Type: General Level of consciousness: awake and alert and oriented Pain management: pain level controlled Vital Signs Assessment: post-procedure vital signs reviewed and stable Respiratory status: spontaneous breathing, nonlabored ventilation and respiratory function stable Cardiovascular status: blood pressure returned to baseline and stable Postop Assessment: no signs of nausea or vomiting Anesthetic complications: no     Last Vitals:  Vitals:   08/19/18 1020 08/19/18 1027  BP:  139/69  Pulse: 78 80  Resp: 14 16  Temp: (!) 36.3 C   SpO2: 96% 96%    Last Pain:  Vitals:   08/19/18 1020  TempSrc:   PainSc: 0-No pain                 Donnia Poplaski

## 2018-08-19 NOTE — Discharge Instructions (Signed)
AV Fistula Placement, Care After  This sheet gives you information about how to care for yourself after your procedure. Your health care provider may also give you more specific instructions. If you have problems or questions, contact your health care provider.  What can I expect after the procedure?  After the procedure, it is common to:  · Feel sore.  · Feel a vibration (thrill) over the fistula.  Follow these instructions at home:  Incision care         · Follow instructions from your health care provider about how to take care of your incision. Make sure you:  ? Wash your hands with soap and water before and after you change your bandage (dressing). If soap and water are not available, use hand sanitizer.  ? Change your dressing as told by your health care provider.  ? Leave stitches (sutures), skin glue, or adhesive strips in place. These skin closures may need to stay in place for 2 weeks or longer. If adhesive strip edges start to loosen and curl up, you may trim the loose edges. Do not remove adhesive strips completely unless your health care provider tells you to do that.  Fistula care  · Check your fistula site every day to make sure the thrill feels the same.  · Check your fistula site every day for signs of infection. Check for:  ? Redness, swelling, or pain.  ? Fluid or blood.  ? Warmth.  ? Pus or a bad smell.  · Raise (elevate) the affected area above the level of your heart while you are sitting or lying down.  · Do not lift anything that is heavier than 10 lb (4.5 kg), or the limit that you are told, until your health care provider says that it is safe.  · Do not lie down on your fistula arm.  · Do not let anyone draw blood or take a blood pressure reading on your fistula arm. This is important.  · Do not wear tight jewelry or clothing over your fistula arm.  Bathing  · Do not take baths, swim, or use a hot tub until your health care provider approves. Ask your health care provider if you may take  showers. You may only be allowed to take sponge baths.  · Keep the area around your incision clean and dry.  Medicines  · Take over-the-counter and prescription medicines only as told by your health care provider.  · Ask your health care provider if any medicine prescribed to you can cause constipation. You may need to take steps to prevent or treat constipation, such as:  ? Drink enough fluid to keep your urine pale yellow.  ? Take over-the-counter or prescription medicines.  ? Eat foods that are high in fiber, such as beans, whole grains, and fresh fruits and vegetables.  ? Limit foods that are high in fat and processed sugars, such as fried or sweet foods.  General instructions  · Rest at home for a day or two.  · Return to your normal activities as told by your health care provider. Ask your health care provider what activities are safe for you.  · Keep all follow-up visits as told by your health care provider. This is important.  Contact a health care provider if:  · You have redness, swelling, or pain around your fistula site.  · Your fistula site feels warm to the touch.  · You have pus or a bad smell coming from your   pain.  Have trouble breathing. Summary  Follow instructions from your health care provider about how to take care of your incision.  Do not let anyone draw blood or take a blood pressure reading on your fistula arm. This is important.  Return to your normal activities as told by your health care provider. Ask your health care provider what activities are safe for you.  Contact a health care provider if you have a change in the thrill or have any signs of infection at your fistula site.  Keep all follow-up visits as told by your health care provider. This is  important. This information is not intended to replace advice given to you by your health care provider. Make sure you discuss any questions you have with your health care provider. Document Released: 06/29/2005 Document Revised: 01/03/2018 Document Reviewed: 01/03/2018 Elsevier Interactive Patient Education  2019 [place]   1) The drugs that you were given will stay in your system until tomorrow so for the next 24 hours you should not:  A) Drive an automobile B) Make any legal decisions C) Drink any alcoholic beverage   2) You may resume regular meals tomorrow.  Today it is better to start with liquids and gradually work up to solid foods.  You may eat anything you prefer, but it is better to start with liquids, then soup and crackers, and gradually work up to solid foods.   3) Please notify your doctor immediately if you have any unusual bleeding, trouble breathing, redness and pain at the surgery site, drainage, fever, or pain not relieved by medication.    4) Additional Instructions:        Please contact your physician with any problems or Same Day Surgery at [phone], Monday through Friday 6 am to 4 pm, or [place] at [facility] number at [phone].

## 2018-08-20 DIAGNOSIS — D509 Iron deficiency anemia, unspecified: Secondary | ICD-10-CM | POA: Diagnosis not present

## 2018-08-20 DIAGNOSIS — N2581 Secondary hyperparathyroidism of renal origin: Secondary | ICD-10-CM | POA: Diagnosis not present

## 2018-08-20 DIAGNOSIS — N186 End stage renal disease: Secondary | ICD-10-CM | POA: Diagnosis not present

## 2018-08-20 DIAGNOSIS — D631 Anemia in chronic kidney disease: Secondary | ICD-10-CM | POA: Diagnosis not present

## 2018-08-20 DIAGNOSIS — Z23 Encounter for immunization: Secondary | ICD-10-CM | POA: Diagnosis not present

## 2018-08-22 MED FILL — Heparin Sodium (Porcine) Inj 1000 Unit/ML: INTRAMUSCULAR | Qty: 5000 | Status: AC

## 2018-08-23 ENCOUNTER — Other Ambulatory Visit: Payer: Self-pay | Admitting: Physician Assistant

## 2018-08-23 DIAGNOSIS — N183 Chronic kidney disease, stage 3 unspecified: Secondary | ICD-10-CM

## 2018-08-23 DIAGNOSIS — I10 Essential (primary) hypertension: Secondary | ICD-10-CM

## 2018-08-23 DIAGNOSIS — Z23 Encounter for immunization: Secondary | ICD-10-CM | POA: Diagnosis not present

## 2018-08-23 DIAGNOSIS — D631 Anemia in chronic kidney disease: Secondary | ICD-10-CM

## 2018-08-23 DIAGNOSIS — N2581 Secondary hyperparathyroidism of renal origin: Secondary | ICD-10-CM | POA: Diagnosis not present

## 2018-08-23 DIAGNOSIS — N186 End stage renal disease: Secondary | ICD-10-CM | POA: Diagnosis not present

## 2018-08-23 DIAGNOSIS — D509 Iron deficiency anemia, unspecified: Secondary | ICD-10-CM | POA: Diagnosis not present

## 2018-08-25 DIAGNOSIS — D509 Iron deficiency anemia, unspecified: Secondary | ICD-10-CM | POA: Diagnosis not present

## 2018-08-25 DIAGNOSIS — Z23 Encounter for immunization: Secondary | ICD-10-CM | POA: Diagnosis not present

## 2018-08-25 DIAGNOSIS — D631 Anemia in chronic kidney disease: Secondary | ICD-10-CM | POA: Diagnosis not present

## 2018-08-25 DIAGNOSIS — N186 End stage renal disease: Secondary | ICD-10-CM | POA: Diagnosis not present

## 2018-08-25 DIAGNOSIS — N2581 Secondary hyperparathyroidism of renal origin: Secondary | ICD-10-CM | POA: Diagnosis not present

## 2018-08-26 ENCOUNTER — Inpatient Hospital Stay: Payer: Self-pay | Admitting: Physician Assistant

## 2018-08-27 DIAGNOSIS — Z23 Encounter for immunization: Secondary | ICD-10-CM | POA: Diagnosis not present

## 2018-08-27 DIAGNOSIS — D631 Anemia in chronic kidney disease: Secondary | ICD-10-CM | POA: Diagnosis not present

## 2018-08-27 DIAGNOSIS — N186 End stage renal disease: Secondary | ICD-10-CM | POA: Diagnosis not present

## 2018-08-27 DIAGNOSIS — D509 Iron deficiency anemia, unspecified: Secondary | ICD-10-CM | POA: Diagnosis not present

## 2018-08-27 DIAGNOSIS — N2581 Secondary hyperparathyroidism of renal origin: Secondary | ICD-10-CM | POA: Diagnosis not present

## 2018-08-28 ENCOUNTER — Encounter (INDEPENDENT_AMBULATORY_CARE_PROVIDER_SITE_OTHER): Payer: Self-pay

## 2018-08-29 ENCOUNTER — Encounter: Payer: Self-pay | Admitting: Physician Assistant

## 2018-08-30 ENCOUNTER — Other Ambulatory Visit: Payer: Self-pay

## 2018-08-30 ENCOUNTER — Encounter (INDEPENDENT_AMBULATORY_CARE_PROVIDER_SITE_OTHER): Payer: Self-pay | Admitting: Vascular Surgery

## 2018-08-30 ENCOUNTER — Ambulatory Visit (INDEPENDENT_AMBULATORY_CARE_PROVIDER_SITE_OTHER): Payer: Medicare Other | Admitting: Vascular Surgery

## 2018-08-30 VITALS — BP 145/75 | HR 88 | Resp 14 | Ht 68.0 in | Wt 293.0 lb

## 2018-08-30 DIAGNOSIS — Z992 Dependence on renal dialysis: Secondary | ICD-10-CM

## 2018-08-30 DIAGNOSIS — D631 Anemia in chronic kidney disease: Secondary | ICD-10-CM

## 2018-08-30 DIAGNOSIS — N183 Chronic kidney disease, stage 3 unspecified: Secondary | ICD-10-CM

## 2018-08-30 DIAGNOSIS — N186 End stage renal disease: Secondary | ICD-10-CM | POA: Diagnosis not present

## 2018-08-30 DIAGNOSIS — Z23 Encounter for immunization: Secondary | ICD-10-CM | POA: Diagnosis not present

## 2018-08-30 DIAGNOSIS — N2581 Secondary hyperparathyroidism of renal origin: Secondary | ICD-10-CM | POA: Diagnosis not present

## 2018-08-30 DIAGNOSIS — D509 Iron deficiency anemia, unspecified: Secondary | ICD-10-CM | POA: Diagnosis not present

## 2018-08-30 MED ORDER — CEPHALEXIN 500 MG PO CAPS: 500.0000 mg | ORAL_CAPSULE | Freq: Two times a day (BID) | ORAL | 0 refills | Status: DC

## 2018-08-30 MED ORDER — TRAMADOL HCL 50 MG PO TABS: ORAL_TABLET | ORAL | 0 refills | Status: DC

## 2018-08-30 NOTE — Progress Notes (Signed)
Subjective:    Patient ID: [name], female    DOB: Oct 31, 1955, 63 y.o.   MRN: [id number] Chief Complaint  Patient presents with  . Follow-up   Patient presents sent sooner than her originally scheduled first postoperative follow-up.  Patient with a chief complaint of incisional pain and dehiscence.  The patient is status post a left brachial basilic arteriovenous fistula placement on 08/19/18.  The patient denies any fever, nausea vomiting.  The patient notes that the incision is painful.  Patient notes that a small medial aspect of the incision has opened.  Patient denies any drainage from incision.  Patient with some surrounding erythema to incision.  She has not notes pain is radiating towards her hand.  Denies any ulcer formation to the left hand.  Patient is currently being maintained by a right IJ PermCath without issue.  Review of Systems  Constitutional: Negative.   HENT: Negative.   Eyes: Negative.   Respiratory: Negative.   Cardiovascular: Negative.   Gastrointestinal: Negative.   Endocrine: Negative.   Genitourinary: Negative.   Musculoskeletal: Negative.   Skin: Positive for wound.  Allergic/Immunologic: Negative.   Neurological: Negative.   Hematological: Negative.   Psychiatric/Behavioral: Negative.       Objective:   Physical Exam Vitals signs reviewed.  Constitutional:      Appearance: Normal appearance. She is normal weight.  HENT:     Head: Normocephalic and atraumatic.     Right Ear: External ear normal.     Left Ear: External ear normal.     Nose: Nose normal.     Mouth/Throat:     Mouth: Mucous membranes are moist.     Pharynx: Oropharynx is clear.  Eyes:     Extraocular Movements: Extraocular movements intact.     Conjunctiva/sclera: Conjunctivae normal.     Pupils: Pupils are equal, round, and reactive to light.  Neck:     Musculoskeletal: Normal range of motion.  Cardiovascular:     Rate and Rhythm: Normal rate and regular rhythm.   Pulses: Normal pulses.     Comments: There is a 2+ radial pulse to the left hand.  The left hand is warm. Fistula: Hard to palpate a thrill however a bruit is clearly heard at the anastomosis and throughout the fistula.  The upper extremity is soft and the forearm is soft.  Sensory/motor is intact to the arm / hand is intact.  Slight erythema surrounding the wound and a very small aspect of the subcuticular layer (approximately 1cm) has dehisced.  The wound bed is healthy and noninfected. Pulmonary:     Effort: Pulmonary effort is normal.     Breath sounds: Normal breath sounds.  Musculoskeletal: Normal range of motion.  Skin:    General: Skin is warm and dry.     Capillary Refill: Capillary refill takes less than 2 seconds.  Neurological:     General: No focal deficit present.     Mental Status: She is alert and oriented to person, place, and time. Mental status is at baseline.  Psychiatric:        Mood and Affect: Mood normal.        Behavior: Behavior normal.        Thought Content: Thought content normal.        Judgment: Judgment normal.    BP (!) 145/75 (BP Location: Right Arm, Patient Position: Sitting, Cuff Size: Large)   Pulse 88   Resp 14   Ht 5' 8"  (1.727  m)   Wt 293 lb (132.9 kg)   BMI 44.55 kg/m   Past Medical History:  Diagnosis Date  . Anemia   . Aortic stenosis    Echo 8/18: mean 13, peak 28, LVOT/AV mean velocity 0.51  . Arthritis   . Asthma    As a child   . Bronchitis   . CAD (coronary artery disease)    a. 09/2016: 50% Ost 1st Mrg stenosis, 50% 2nd Mrg stenosis, 20% Mid-Cx, 95% Prox LAD, 40% mid-LAD, and 10% dist-LAD stenosis. Staged PCI with DES to Prox-LAD.   [name] Chronic combined systolic and diastolic CHF (congestive heart failure) ([place]) 2011   echo 2/18: EF 55-60, normal wall motion, grade 2 diastolic dysfunction, trivial AI // echo 3/18: Septal and apical HK, EF 45-50, normal wall motion, trivial AI, mild LAE, PASP 38 // echo 8/18: EF 60-65, normal wall  motion, grade 1 diastolic dysfunction, calcified aortic valve leaflets, mild aortic stenosis (mean 13, peak 28, LVOT/AV mean velocity 0.51), mild AI, moderate MAC, mild LAE, trivial TR   . Chronic kidney disease    STAGE 4  . Depression   . Diabetes mellitus Dx 1989  . GERD (gastroesophageal reflux disease)   . Gout   . Hepatitis C Dx 2013  . Hypertension Dx 1989  . Myocardial infarction ([place]) 07/2015  . Obesity   . Obstructive sleep apnea   . Pancreatitis 2013  . Pneumonia   . Refusal of blood transfusions as patient is Jehovah's Witness   . Tendinitis   . Tremors of nervous system    LEFT HAND  . Ulcer 2010   Social History   Socioeconomic History  . Marital status: Divorced    Spouse name: Not on file  . Number of children: Not on file  . Years of education: Not on file  . Highest education level: Not on file  Occupational History  . Not on file  Social Needs  . Financial resource strain: Not on file  . Food insecurity:    Worry: Not on file    Inability: Not on file  . Transportation needs:    Medical: Not on file    Non-medical: Not on file  Tobacco Use  . Smoking status: Former Smoker    Types: Cigarettes    Last attempt to quit: 10/25/1980    Years since quitting: 37.8  . Smokeless tobacco: Never Used  . Tobacco comment: quit smoking in 1982  Substance and Sexual Activity  . Alcohol use: Yes    Comment: rarely  . Drug use: Not Currently    Types: "Crack" cocaine    Comment: 08/21/2016 "clean since 05/1998"  . Sexual activity: Not on file    Comment: Not asked  Lifestyle  . Physical activity:    Days per week: Not on file    Minutes per session: Not on file  . Stress: Not on file  Relationships  . Social connections:    Talks on phone: Not on file    Gets together: Not on file    Attends religious service: Not on file    Active member of club or organization: Not on file    Attends meetings of clubs or organizations: Not on file    Relationship  status: Not on file  . Intimate partner violence:    Fear of current or ex partner: Not on file    Emotionally abused: Not on file    Physically abused: Not on file  Forced sexual activity: Not on file  Other Topics Concern  . Not on file  Social History Narrative  . Not on file   Past Surgical History:  Procedure Laterality Date  . APPLICATION OF WOUND VAC Left 08/14/2017   Procedure: APPLICATION OF WOUND VAC Exchange;  Surgeon: Robert Bellow, MD;  Location: [facility] ORS;  Service: General;  Laterality: Left;  . AV FISTULA PLACEMENT Left 08/19/2018   Procedure: ARTERIOVENOUS (AV) FISTULA CREATION ( BRACHIOBASILIC );  Surgeon: Katha Cabal, MD;  Location: [facility] ORS;  Service: Vascular;  Laterality: Left;  . CHOLECYSTECTOMY    . COLONOSCOPY WITH PROPOFOL N/A 02/03/2018   Procedure: COLONOSCOPY WITH PROPOFOL;  Surgeon: Lin Landsman, MD;  Location: [facility] ENDOSCOPY;  Service: Gastroenterology;  Laterality: N/A;  . CORONARY ANGIOPLASTY  07/2015   STENT  . CORONARY STENT INTERVENTION N/A 09/18/2016   Procedure: Coronary Stent Intervention;  Surgeon: Troy Sine, MD;  Location: [place] [facility] LAB;  Service: Cardiovascular;  Laterality: N/A;  . DIALYSIS/PERMA CATHETER INSERTION N/A 05/10/2018   Procedure: DIALYSIS/PERMA CATHETER INSERTION;  Surgeon: Katha Cabal, MD;  Location: [place] [facility] LAB;  Service: Cardiovascular;  Laterality: N/A;  . DRESSING CHANGE UNDER ANESTHESIA Left 08/15/2017   Procedure: exploration of wound for bleeding;  Surgeon: Robert Bellow, MD;  Location: [facility] ORS;  Service: General;  Laterality: Left;  . ESOPHAGOGASTRODUODENOSCOPY (EGD) WITH PROPOFOL N/A 02/03/2018   Procedure: ESOPHAGOGASTRODUODENOSCOPY (EGD) WITH PROPOFOL;  Surgeon: Lin Landsman, MD;  Location: [facility] ENDOSCOPY;  Service: Gastroenterology;  Laterality: N/A;  . EYE SURGERY    . INCISION AND DRAINAGE ABSCESS Left 08/12/2017   Procedure: INCISION AND DRAINAGE ABSCESS;  Surgeon:  Robert Bellow, MD;  Location: [facility] ORS;  Service: General;  Laterality: Left;  . KNEE ARTHROSCOPY    . LEFT HEART CATH N/A 09/18/2016   Procedure: Left Heart Cath;  Surgeon: Troy Sine, MD;  Location: [place] CV LAB;  Service: Cardiovascular;  Laterality: N/A;  . LEFT HEART CATH AND CORONARY ANGIOGRAPHY N/A 09/16/2016   Procedure: Left Heart Cath and Coronary Angiography;  Surgeon: Burnell Blanks, MD;  Location: [place] [facility];  Service: Cardiovascular;  Laterality: N/A;  . LEFT HEART CATH AND CORONARY ANGIOGRAPHY N/A 04/29/2017   Procedure: LEFT HEART CATH AND CORONARY ANGIOGRAPHY;  Surgeon: Nelva Bush, MD;  Location: [place] [facility] LAB;  Service: Cardiovascular;  Laterality: N/A;  . LOWER EXTREMITY ANGIOGRAPHY Right 03/08/2018   Procedure: LOWER EXTREMITY ANGIOGRAPHY;  Surgeon: Katha Cabal, MD;  Location: [place] [facility];  Service: Cardiovascular;  Laterality: Right;  . TUBAL LIGATION    . TUBAL LIGATION     Family History  Problem Relation Age of Onset  . Colon cancer Mother   . Heart attack Other   . Heart attack Maternal Grandmother   . Hypertension Sister   . Hypertension Brother   . Diabetes Paternal Grandmother   . Breast cancer Neg Hx    Allergies  Allergen Reactions  . Shellfish Allergy Anaphylaxis and Swelling  . Diazepam Other (See Comments)    "felt like out of body experience"  . Morphine And Related Itching      Assessment & Plan:  Patient presents sent sooner than her originally scheduled first postoperative follow-up.  Patient with a chief complaint of incisional pain and dehiscence.  The patient is status post a left brachial basilic arteriovenous fistula placement on 08/19/18.  The patient denies any fever, nausea vomiting.  The patient  notes that the incision is painful.  Patient notes that a small medial aspect of the incision has opened.  Patient denies any drainage from incision.  Patient with some surrounding erythema to  incision.  She has not notes pain is radiating towards her hand.  Denies any ulcer formation to the left hand.  Patient is currently being maintained by a right IJ PermCath without issue.  1. ESRD on dialysis ([place]) - Stable The patient is status post creation of a brachial basilic AV fistula on 03/17/61. She presents today with a small dehiscence to the medial aspect.  There is some surrounding erythema for which I will call in some Keflex. The patient is asking for more pain medicine.  I have called in some tramadol. There is no acute vascular compromise to the left upper extremity on exam today.  Due to the patient's body habitus start to palpate a thrill however you can clearly hear a bruit. The patient has not been cleaning her wound.  We discussed on a daily basis gently cleaning the incision with some antibacterial soap and patting it dry. Some Steri-Strips were placed to the incision for support. The patient is scheduled to return in 10 days for her first post operative HDA. The patient is to call the office in 24 to 48 hours if the surrounding erythema has not improved or if she should experience any pain, nausea or vomiting.  2. Anemia in stage 3 chronic kidney disease ([place]) - Stable This is followed by the patient's nephrologist and primary care physician. The patient presents asymptomatically today  Current Outpatient Medications on File Prior to Visit  Medication Sig Dispense Refill  . ACCU-CHEK SOFTCLIX LANCETS lancets USE AS DIRECTED TO CHECK BLOOD SUGAR THREE TIMES DAILY AND AT BEDTIME 400 each 1  . albuterol (PROAIR HFA) 108 (90 Base) MCG/ACT inhaler INHALE 2 PUFFS BY MOUTH EVERY 4 HOURS AS NEEDED FOR SHORTNESS OF BREATH (Patient taking differently: Inhale 2 puffs into the lungs every 4 (four) hours as needed for wheezing or shortness of breath. INHALE 2 PUFFS BY MOUTH EVERY 4 HOURS AS NEEDED FOR SHORTNESS OF BREATH) 8.5 g 5  . allopurinol (ZYLOPRIM) 100 MG tablet TAKE 1 TABLET BY  MOUTH ONCE DAILY. (Patient taking differently: Take 100 mg by mouth daily. ) 90 tablet 1  . aspirin EC 81 MG EC tablet Take 1 tablet (81 mg total) by mouth daily.    [name] atorvastatin (LIPITOR) 80 MG tablet TAKE 1 TABLET BY MOUTH ONCE DAILY AT 6PM 28 tablet 10  . benzonatate (TESSALON PERLES) 100 MG capsule Take 1-2 tabs TID prn cough 30 capsule 0  . carvedilol (COREG) 25 MG tablet TAKE (1) TABLET BY MOUTH TWICE A DAY WITH MEALS (BREAKFAST AND SUPPER) 56 tablet 10  . docusate sodium (COLACE) 100 MG capsule Take 1 capsule (100 mg total) by mouth 2 (two) times daily as needed for mild constipation. (Patient taking differently: Take 100 mg by mouth daily as needed for mild constipation. ) 30 capsule 0  . fluticasone (FLONASE) 50 MCG/ACT nasal spray Place 2 sprays into both nostrils daily as needed for allergies or rhinitis. 48 g 1  . glucose blood (ACCU-CHEK GUIDE) test strip To check blood sugar before meals and bedtime 400 each 12  . hydrALAZINE (APRESOLINE) 100 MG tablet TAKE (1) TABLET BY MOUTH THREE TIMES DAILY (Patient taking differently: Take 100 mg by mouth 2 (two) times daily. ) 90 tablet 5  . hydrOXYzine (ATARAX/VISTARIL) 25 MG tablet TAKE (  1) TABLET BY MOUTH EVERY SIX HOURS AS NEEDED FOR ITCHING. (Patient taking differently: Take 50 mg by mouth every 6 (six) hours as needed for itching. ) 120 tablet 0  . insulin aspart (NOVOLOG FLEXPEN) 100 UNIT/ML FlexPen Inject 25 Units into the skin 3 (three) times daily with meals. 15 mL 11  . Insulin Pen Needle (PEN NEEDLES) 30G X 8 MM MISC 1 each by Does not apply route 3 (three) times daily before meals. AC for novolog injections 100 each 11  . Lancet Devices (ACCU-CHEK SOFTCLIX) lancets Use as instructed daily. 1 each 5  . LANTUS SOLOSTAR 100 UNIT/ML Solostar Pen INJECT 60 UNITS S.Q. TWICE DAILY. (Patient taking differently: Inject 60 Units into the skin 2 (two) times daily. ) 24 mL 11  . liraglutide (VICTOZA) 18 MG/3ML SOPN Start with 0.547m daily and  increase by 0.672mper week until max dose of 1.8 mg dose achieved. (Patient taking differently: Inject 1.2 mg into the skin daily. Start with 0.47m73maily and increase by 0.47mg59mr week until max dose of 1.8 mg dose achieved.) 1 pen 6  . loratadine (CLARITIN) 10 MG tablet TAKE 1 TABLET BY MOUTH ONCE DAILY. (Patient taking differently: Take 10 mg by mouth daily. ) 90 tablet 1  . mometasone (ELOCON) 0.1 % cream Apply 1 application topically daily. (Patient taking differently: Apply 1 application topically daily as needed (rash). ) 45 g 0  . mometasone-formoterol (DULERA) 200-5 MCG/ACT AERO Inhale 2 puffs into the lungs 2 (two) times daily. (Patient taking differently: Inhale 2 puffs into the lungs 2 (two) times daily as needed for wheezing or shortness of breath. ) 13 g 5  . montelukast (SINGULAIR) 10 MG tablet TAKE ONE TABLET BY MOUTH AT BEDTIME. (Patient taking differently: Take 10 mg by mouth at bedtime. ) 90 tablet 1  . multivitamin (RENA-VIT) TABS tablet Take 1 tablet by mouth daily.    . nitroGLYCERIN (NITROSTAT) 0.4 MG SL tablet Place 1 tablet (0.4 mg total) under the tongue every 5 (five) minutes x 3 doses as needed for chest pain. 25 tablet 12  . omeprazole (PRILOSEC) 20 MG capsule Take 1 capsule (20 mg total) by mouth daily. 90 capsule 3  . oxyCODONE-acetaminophen (PERCOCET) 5-325 MG tablet Take 1-2 tablets by mouth every 6 (six) hours as needed for moderate pain or severe pain. 40 tablet 0  . pregabalin (LYRICA) 75 MG capsule Take 1 capsule (75 mg total) by mouth 2 (two) times daily. 180 capsule 1  . SENNA PLUS 8.6-50 MG tablet TAKE ONE TABLET BY MOUTH AT BEDTIME. (Patient taking differently: Take 1 tablet by mouth at bedtime. ) 90 tablet 1  . torsemide (DEMADEX) 20 MG tablet TAKE (2) TABLETS BY MOUTH TWICE DAILY. (Patient taking differently: Take 40 mg by mouth 2 (two) times daily. ) 120 tablet 5  . triamcinolone cream (KENALOG) 0.1 % APPLY TO AFFECTED AREA TWICE DAILY (Patient taking  differently: Apply 1 application topically daily as needed (itching). ) 80 g 0  . valACYclovir (VALTREX) 500 MG tablet TAKE (1) TABLET BY MOUTH EVERY OTHER DAY. (Patient taking differently: Take 500 mg by mouth every other day. ) 45 tablet 1  . Vitamin D, Ergocalciferol, (DRISDOL) 50000 units CAPS capsule TAKE 1 CAPSULE BY MOUTH ONCE A MONTH (Patient taking differently: Take 50,000 Units by mouth every 30 (thirty) days. ) 3 capsule 3  . BRILINTA 90 MG TABS tablet TAKE 1 TABLET BY MOUTH TWICE DAILY (Patient not taking: Reported on 08/30/2018) 56 tablet  10  . FEROSUL 325 (65 Fe) MG tablet TAKE (1) TABLET BY MOUTH EVERY OTHER DAY. (Patient not taking: Reported on 08/30/2018) 14 tablet 4  . isosorbide mononitrate (IMDUR) 30 MG 24 hr tablet TAKE (3) TABLETS BY MOUTH ONCE DAILY. (Patient not taking: Reported on 08/30/2018) 84 tablet 10  . mupirocin ointment (BACTROBAN) 2 % APPLY INTO THE NOSTRILS TWICE DAILY AS DIRECTED. (Patient not taking: No sig reported) 22 g 0  . [DISCONTINUED] gabapentin (NEURONTIN) 100 MG capsule Take 1 capsule (100 mg total) by mouth 3 (three) times daily. 90 capsule 1   No current facility-administered medications on file prior to visit.    There are no Patient Instructions on file for this visit. No follow-ups on file.  Jonessa Triplett A Neshawn Aird, PA-C

## 2018-08-31 DIAGNOSIS — H2513 Age-related nuclear cataract, bilateral: Secondary | ICD-10-CM | POA: Diagnosis not present

## 2018-09-01 ENCOUNTER — Telehealth (INDEPENDENT_AMBULATORY_CARE_PROVIDER_SITE_OTHER): Payer: Self-pay

## 2018-09-01 DIAGNOSIS — Z23 Encounter for immunization: Secondary | ICD-10-CM | POA: Diagnosis not present

## 2018-09-01 DIAGNOSIS — N186 End stage renal disease: Secondary | ICD-10-CM | POA: Diagnosis not present

## 2018-09-01 DIAGNOSIS — D631 Anemia in chronic kidney disease: Secondary | ICD-10-CM | POA: Diagnosis not present

## 2018-09-01 DIAGNOSIS — N2581 Secondary hyperparathyroidism of renal origin: Secondary | ICD-10-CM | POA: Diagnosis not present

## 2018-09-01 DIAGNOSIS — D509 Iron deficiency anemia, unspecified: Secondary | ICD-10-CM | POA: Diagnosis not present

## 2018-09-01 NOTE — Telephone Encounter (Signed)
Patient has called in stating that the incision site has white pus draining from it. Patient was seen on 08/30/2018, patient had surgery on 08/19/2018. Patient is on antibiotics ( Keflex 500mg ) and she is making an appt to be seen in our office next week. I did advise that if it got worse to go to the ED to be evaluated.

## 2018-09-02 ENCOUNTER — Ambulatory Visit: Payer: Self-pay | Admitting: Physician Assistant

## 2018-09-03 DIAGNOSIS — N2581 Secondary hyperparathyroidism of renal origin: Secondary | ICD-10-CM | POA: Diagnosis not present

## 2018-09-03 DIAGNOSIS — N186 End stage renal disease: Secondary | ICD-10-CM | POA: Diagnosis not present

## 2018-09-03 DIAGNOSIS — Z23 Encounter for immunization: Secondary | ICD-10-CM | POA: Diagnosis not present

## 2018-09-03 DIAGNOSIS — D631 Anemia in chronic kidney disease: Secondary | ICD-10-CM | POA: Diagnosis not present

## 2018-09-03 DIAGNOSIS — D509 Iron deficiency anemia, unspecified: Secondary | ICD-10-CM | POA: Diagnosis not present

## 2018-09-06 DIAGNOSIS — D509 Iron deficiency anemia, unspecified: Secondary | ICD-10-CM | POA: Diagnosis not present

## 2018-09-06 DIAGNOSIS — D631 Anemia in chronic kidney disease: Secondary | ICD-10-CM | POA: Diagnosis not present

## 2018-09-06 DIAGNOSIS — Z23 Encounter for immunization: Secondary | ICD-10-CM | POA: Diagnosis not present

## 2018-09-06 DIAGNOSIS — N2581 Secondary hyperparathyroidism of renal origin: Secondary | ICD-10-CM | POA: Diagnosis not present

## 2018-09-06 DIAGNOSIS — N186 End stage renal disease: Secondary | ICD-10-CM | POA: Diagnosis not present

## 2018-09-08 ENCOUNTER — Other Ambulatory Visit (INDEPENDENT_AMBULATORY_CARE_PROVIDER_SITE_OTHER): Payer: Self-pay | Admitting: Vascular Surgery

## 2018-09-08 DIAGNOSIS — D509 Iron deficiency anemia, unspecified: Secondary | ICD-10-CM | POA: Diagnosis not present

## 2018-09-08 DIAGNOSIS — D631 Anemia in chronic kidney disease: Secondary | ICD-10-CM | POA: Diagnosis not present

## 2018-09-08 DIAGNOSIS — N2581 Secondary hyperparathyroidism of renal origin: Secondary | ICD-10-CM | POA: Diagnosis not present

## 2018-09-08 DIAGNOSIS — N186 End stage renal disease: Secondary | ICD-10-CM | POA: Diagnosis not present

## 2018-09-08 DIAGNOSIS — Z23 Encounter for immunization: Secondary | ICD-10-CM | POA: Diagnosis not present

## 2018-09-09 ENCOUNTER — Other Ambulatory Visit: Payer: Self-pay

## 2018-09-09 ENCOUNTER — Ambulatory Visit (INDEPENDENT_AMBULATORY_CARE_PROVIDER_SITE_OTHER): Payer: Medicare Other

## 2018-09-09 ENCOUNTER — Encounter (INDEPENDENT_AMBULATORY_CARE_PROVIDER_SITE_OTHER): Payer: Self-pay | Admitting: Nurse Practitioner

## 2018-09-09 ENCOUNTER — Ambulatory Visit (INDEPENDENT_AMBULATORY_CARE_PROVIDER_SITE_OTHER): Payer: Medicare Other | Admitting: Nurse Practitioner

## 2018-09-09 VITALS — BP 143/74 | HR 93 | Resp 12 | Ht 68.0 in | Wt 296.0 lb

## 2018-09-09 DIAGNOSIS — Z992 Dependence on renal dialysis: Secondary | ICD-10-CM

## 2018-09-09 DIAGNOSIS — Z79899 Other long term (current) drug therapy: Secondary | ICD-10-CM

## 2018-09-09 DIAGNOSIS — N186 End stage renal disease: Secondary | ICD-10-CM | POA: Diagnosis not present

## 2018-09-09 DIAGNOSIS — E118 Type 2 diabetes mellitus with unspecified complications: Secondary | ICD-10-CM

## 2018-09-09 DIAGNOSIS — K219 Gastro-esophageal reflux disease without esophagitis: Secondary | ICD-10-CM

## 2018-09-09 DIAGNOSIS — IMO0002 Reserved for concepts with insufficient information to code with codable children: Secondary | ICD-10-CM

## 2018-09-09 DIAGNOSIS — E1165 Type 2 diabetes mellitus with hyperglycemia: Secondary | ICD-10-CM

## 2018-09-09 MED ORDER — SILVER SULFADIAZINE 1 % EX CREA: 1.0000 "application " | TOPICAL_CREAM | Freq: Every day | CUTANEOUS | 0 refills | Status: DC

## 2018-09-09 MED ORDER — TRAMADOL HCL 50 MG PO TABS: 100.0000 mg | ORAL_TABLET | Freq: Four times a day (QID) | ORAL | 0 refills | Status: AC | PRN

## 2018-09-10 DIAGNOSIS — N2581 Secondary hyperparathyroidism of renal origin: Secondary | ICD-10-CM | POA: Diagnosis not present

## 2018-09-10 DIAGNOSIS — N186 End stage renal disease: Secondary | ICD-10-CM | POA: Diagnosis not present

## 2018-09-10 DIAGNOSIS — Z23 Encounter for immunization: Secondary | ICD-10-CM | POA: Diagnosis not present

## 2018-09-10 DIAGNOSIS — D631 Anemia in chronic kidney disease: Secondary | ICD-10-CM | POA: Diagnosis not present

## 2018-09-10 DIAGNOSIS — D509 Iron deficiency anemia, unspecified: Secondary | ICD-10-CM | POA: Diagnosis not present

## 2018-09-11 DIAGNOSIS — E1122 Type 2 diabetes mellitus with diabetic chronic kidney disease: Secondary | ICD-10-CM | POA: Diagnosis not present

## 2018-09-11 DIAGNOSIS — Z992 Dependence on renal dialysis: Secondary | ICD-10-CM | POA: Diagnosis not present

## 2018-09-11 DIAGNOSIS — N186 End stage renal disease: Secondary | ICD-10-CM | POA: Diagnosis not present

## 2018-09-13 DIAGNOSIS — Z23 Encounter for immunization: Secondary | ICD-10-CM | POA: Diagnosis not present

## 2018-09-13 DIAGNOSIS — N186 End stage renal disease: Secondary | ICD-10-CM | POA: Diagnosis not present

## 2018-09-13 DIAGNOSIS — D509 Iron deficiency anemia, unspecified: Secondary | ICD-10-CM | POA: Diagnosis not present

## 2018-09-13 DIAGNOSIS — D631 Anemia in chronic kidney disease: Secondary | ICD-10-CM | POA: Diagnosis not present

## 2018-09-13 DIAGNOSIS — N2581 Secondary hyperparathyroidism of renal origin: Secondary | ICD-10-CM | POA: Diagnosis not present

## 2018-09-15 DIAGNOSIS — D631 Anemia in chronic kidney disease: Secondary | ICD-10-CM | POA: Diagnosis not present

## 2018-09-15 DIAGNOSIS — N186 End stage renal disease: Secondary | ICD-10-CM | POA: Diagnosis not present

## 2018-09-15 DIAGNOSIS — D509 Iron deficiency anemia, unspecified: Secondary | ICD-10-CM | POA: Diagnosis not present

## 2018-09-15 DIAGNOSIS — N2581 Secondary hyperparathyroidism of renal origin: Secondary | ICD-10-CM | POA: Diagnosis not present

## 2018-09-15 DIAGNOSIS — Z23 Encounter for immunization: Secondary | ICD-10-CM | POA: Diagnosis not present

## 2018-09-16 DIAGNOSIS — Z23 Encounter for immunization: Secondary | ICD-10-CM | POA: Diagnosis not present

## 2018-09-16 DIAGNOSIS — N186 End stage renal disease: Secondary | ICD-10-CM | POA: Diagnosis not present

## 2018-09-16 DIAGNOSIS — N2581 Secondary hyperparathyroidism of renal origin: Secondary | ICD-10-CM | POA: Diagnosis not present

## 2018-09-16 DIAGNOSIS — D509 Iron deficiency anemia, unspecified: Secondary | ICD-10-CM | POA: Diagnosis not present

## 2018-09-16 DIAGNOSIS — D631 Anemia in chronic kidney disease: Secondary | ICD-10-CM | POA: Diagnosis not present

## 2018-09-19 ENCOUNTER — Encounter (INDEPENDENT_AMBULATORY_CARE_PROVIDER_SITE_OTHER): Payer: Self-pay | Admitting: Nurse Practitioner

## 2018-09-19 NOTE — Progress Notes (Signed)
SUBJECTIVE:  Patient ID: [name], female    DOB: [birth year], 63 y.o.   MRN: [id number] Chief Complaint  Patient presents with  . Follow-up    HPI  [name] is a 63 y.o. female presents today for first look at her brachiobasilic AV fistula creation done on 08/19/2018.  It also is to look at the dehiscence of her wound.  The patient states that her arm is still sore and painful.  She has a superficial dehiscence of her wound.  It is not draining. It appears to be very superficial.  There is no drainage from the wound.  The patient denies any fever, chills, nausea, vomiting or diarrhea.  She denies any chest pain or shortness of breath.  She denies any TIA-like symptoms or amaurosis fugax.  The patient underwent a hemodialysis access duplex today which revealed a flow volume of 631.  The brachiobasilic AV fistula is patent with a hemodynamically significant velocity increase noted at the anastomosis.  Past Medical History:  Diagnosis Date  . Anemia   . Aortic stenosis    Echo 8/18: mean 13, peak 28, LVOT/AV mean velocity 0.51  . Arthritis   . Asthma    As a child   . Bronchitis   . CAD (coronary artery disease)    a. 09/2016: 50% Ost 1st Mrg stenosis, 50% 2nd Mrg stenosis, 20% Mid-Cx, 95% Prox LAD, 40% mid-LAD, and 10% dist-LAD stenosis. Staged PCI with DES to Prox-LAD.   [name] Chronic combined systolic and diastolic CHF (congestive heart failure) ([place]) 2011   echo 2/18: EF 55-60, normal wall motion, grade 2 diastolic dysfunction, trivial AI // echo 3/18: Septal and apical HK, EF 45-50, normal wall motion, trivial AI, mild LAE, PASP 38 // echo 8/18: EF 60-65, normal wall motion, grade 1 diastolic dysfunction, calcified aortic valve leaflets, mild aortic stenosis (mean 13, peak 28, LVOT/AV mean velocity 0.51), mild AI, moderate MAC, mild LAE, trivial TR   . Chronic kidney disease    STAGE 4  . Depression   . Diabetes mellitus Dx 1989  . GERD (gastroesophageal reflux disease)   .  Gout   . Hepatitis C Dx 2013  . Hypertension Dx 1989  . Myocardial infarction ([place]) 07/2015  . Obesity   . Obstructive sleep apnea   . Pancreatitis 2013  . Pneumonia   . Refusal of blood transfusions as patient is Jehovah's Witness   . Tendinitis   . Tremors of nervous system    LEFT HAND  . Ulcer 2010    Past Surgical History:  Procedure Laterality Date  . APPLICATION OF WOUND VAC Left 08/14/2017   Procedure: APPLICATION OF WOUND VAC Exchange;  Surgeon: Robert Bellow, MD;  Location: [facility] ORS;  Service: General;  Laterality: Left;  . AV FISTULA PLACEMENT Left 08/19/2018   Procedure: ARTERIOVENOUS (AV) FISTULA CREATION ( BRACHIOBASILIC );  Surgeon: Katha Cabal, MD;  Location: [facility];  Service: Vascular;  Laterality: Left;  . CHOLECYSTECTOMY    . COLONOSCOPY WITH PROPOFOL N/A 02/03/2018   Procedure: COLONOSCOPY WITH PROPOFOL;  Surgeon: Lin Landsman, MD;  Location: [facility] END[facility]COPY;  Service: Gastroenterology;  Laterality: N/A;  . CORONARY ANGIOPLASTY  07/2015   STENT  . CORONARY STENT INTERVENTION N/A 09/18/2016   Procedure: Coronary Stent Intervention;  Surgeon: Troy Sine, MD;  Location: [place] [facility];  Service: Cardiovascular;  Laterality: N/A;  . DIALYSIS/PERMA CATHETER INSERTION N/A 05/10/2018   Procedure: DIALYSIS/PERMA CATHETER INSERTION;  Surgeon: Delana Meyer,  Dolores Lory, MD;  Location: [place] [facility];  Service: Cardiovascular;  Laterality: N/A;  . DRESSING CHANGE UNDER ANESTHESIA Left 08/15/2017   Procedure: exploration of wound for bleeding;  Surgeon: Robert Bellow, MD;  Location: [facility];  Service: General;  Laterality: Left;  . ESOPHAGOGASTRODUODENOSCOPY (EGD) WITH PROPOFOL N/A 02/03/2018   Procedure: ESOPHAGOGASTRODUODENOSCOPY (EGD) WITH PROPOFOL;  Surgeon: Lin Landsman, MD;  Location: [facility] ENDOSCOPY;  Service: Gastroenterology;  Laterality: N/A;  . EYE SURGERY    . INCISION AND DRAINAGE ABSCESS Left 08/12/2017   Procedure: INCISION AND  DRAINAGE ABSCESS;  Surgeon: Robert Bellow, MD;  Location: [facility] OR[facility];  Service: General;  Laterality: Left;  . KNEE ARTHROSCOPY    . LEFT HEART CATH N/A 09/18/2016   Procedure: Left Heart Cath;  Surgeon: Troy Sine, MD;  Location: [place] [facility];  Service: Cardiovascular;  Laterality: N/A;  . LEFT HEART CATH AND CORONARY ANGIOGRAPHY N/A 09/16/2016   Procedure: Left Heart Cath and Coronary Angiography;  Surgeon: Burnell Blanks, MD;  Location: [place] [facility];  Service: Cardiovascular;  Laterality: N/A;  . LEFT HEART CATH AND CORONARY ANGIOGRAPHY N/A 04/29/2017   Procedure: LEFT HEART CATH AND CORONARY ANGIOGRAPHY;  Surgeon: Nelva Bush, MD;  Location: [place] [facility];  Service: Cardiovascular;  Laterality: N/A;  . LOWER EXTREMITY ANGIOGRAPHY Right 03/08/2018   Procedure: LOWER EXTREMITY ANGIOGRAPHY;  Surgeon: Katha Cabal, MD;  Location: [place] [facility];  Service: Cardiovascular;  Laterality: Right;  . TUBAL LIGATION    . TUBAL LIGATION      Social History   Socioeconomic History  . Marital status: Divorced    Spouse name: Not on file  . Number of children: Not on file  . Years of education: Not on file  . Highest education level: Not on file  Occupational History  . Not on file  Social Needs  . Financial resource strain: Not on file  . Food insecurity:    Worry: Not on file    Inability: Not on file  . Transportation needs:    Medical: Not on file    Non-medical: Not on file  Tobacco Use  . Smoking status: Former Smoker    Types: Cigarettes    Last attempt to quit: 10/25/1980    Years since quitting: 37.9  . Smokeless tobacco: Never Used  . Tobacco comment: quit smoking in 1982  Substance and Sexual Activity  . Alcohol use: Yes    Comment: rarely  . Drug use: Not Currently    Types: "Crack" cocaine    Comment: 08/21/2016 "clean since 05/1998"  . Sexual activity: Not on file    Comment: Not asked  Lifestyle  . Physical activity:    Days  per week: Not on file    Minutes per session: Not on file  . Stress: Not on file  Relationships  . Social connections:    Talks on phone: Not on file    Gets together: Not on file    Attends religious service: Not on file    Active member of club or organization: Not on file    Attends meetings of clubs or organizations: Not on file    Relationship status: Not on file  . Intimate partner violence:    Fear of current or ex partner: Not on file    Emotionally abused: Not on file    Physically abused: Not on file    Forced sexual activity: Not on file  Other  Topics Concern  . Not on file  Social History Narrative  . Not on file    Family History  Problem Relation Age of Onset  . Colon cancer Mother   . Heart attack Other   . Heart attack Maternal Grandmother   . Hypertension Sister   . Hypertension Brother   . Diabetes Paternal Grandmother   . Breast cancer Neg Hx     Allergies  Allergen Reactions  . Shellfish Allergy Anaphylaxis and Swelling  . Diazepam Other (See Comments)    "felt like out of body experience"  . Morphine And Related Itching     Review of Systems   Review of Systems: Negative Unless Checked Constitutional: _0 Weight loss  _1 Fever  _2 Chills Cardiac: _3 Chest pain   _4  Atrial Fibrillation  _5 Palpitations   _6 Shortness of breath when laying flat   _7 Shortness of breath with exertion. _8 Shortness of breath at rest Vascular:  _9 Pain in legs with walking   _10 Pain in legs with standing _11 Pain in legs when laying flat   _12 Claudication    _13 Pain in feet when laying flat    _14 History of DVT   _15 Phlebitis   _16 Swelling in legs   _17 Varicose veins   _18 Non-healing ulcers Pulmonary:   _19 Uses home oxygen   _20 Productive cough   _21 Hemoptysis   _22 Wheeze  _23 COPD   _24 Asthma Neurologic:  _25 Dizziness   _26 Seizures  _27 Blackouts _28 History of stroke   _29 History of TIA  _30 Aphasia   _31 Temporary Blindness   _32 Weakness or numbness in arm   _33 Weakness or numbness in  leg Musculoskeletal:   _34 Joint swelling   _35 Joint pain   _36 Low back pain  _37  History of Knee Replacement _38 Arthritis _39 back Surgeries  _40  Spinal Stenosis    Hematologic:  _41 Easy bruising  _42 Easy bleeding   _43 Hypercoagulable state   _44 Anemic Gastrointestinal:  _45 Diarrhea   _46 Vomiting  _47 Gastroesophageal reflux/heartburn   _48 Difficulty swallowing. _49 Abdominal pain Genitourinary:  _50 Chronic kidney disease   _51 Difficult urination  _52 Anuric   _53 Blood in urine _54 Frequent urination  _55 Burning with urination   _56 Hematuria Skin:  _57 Rashes   _58 Ulcers _59 Wounds Psychological:  _60 History of anxiety   _61  History of major depression  _62  Memory Difficulties      OBJECTIVE:   Physical Exam  BP (!) 143/74 (BP Location: Right Arm, Patient Position: Sitting, Cuff Size: Large)   Pulse 93   Resp 12   Ht _63  (1.727 m)   Wt 296 lb (134.3 kg)   BMI 45.01 kg/m   Gen: WD/WN, NAD Head: [place]/AT, No temporalis wasting.  Ear/Nose/Throat: Hearing grossly intact, nares w/o erythema or drainage Eyes: PER, EOMI, sclera nonicteric.  Neck: Supple, no masses.  No JVD.  Pulmonary:  Good air movement, no use of accessory muscles.  Cardiac: RRR Vascular:  Soft thrill and bruit.  Small incision line dehiscence near distal edge of wound.  Superficial Vessel Right Left  Radial Palpable Palpable   Gastrointestinal: soft, non-distended. No guarding/no peritoneal signs.  Musculoskeletal: M/S 5/5 throughout.  No deformity or atrophy.  Neurologic: Pain and light touch intact in extremities.  Symmetrical.  Speech is fluent. Motor exam as listed above. Psychiatric: Judgment intact, Mood & affect appropriate for pt's clinical situation. Dermatologic: No Venous rashes. No Ulcers Noted.  No changes consistent with cellulitis. Lymph : No Cervical lymphadenopathy, no lichenification or skin changes of chronic lymphedema.       ASSESSMENT AND PLAN:  1. ESRD on dialysis [facility]) Currently the patient does not have adequate flow  volume in  her brachiobasilic AV fistula.  This however is  first stage.  She is not quite yet 1 month postop.  The patient also has some small wound dehiscence at the site.  I have given her some sulfa Silvadene cream to apply to the wound daily.  In order to try to help heal some.  Her diabetes is well as end-stage renal disease we will not make this an easy process.  Patient will follow-up in 4 weeks in order to determine if the flow volume has improved as well as if the dehiscence has healed adequately.  If so, the next step would also be to discuss proceeding with the second stage, transposition, of the brachiobasilic AV fistula. 2. Diabetes mellitus type 2, uncontrolled, with complications ([place]) Continue hypoglycemic medications as already ordered, these medications have been reviewed and there are no changes at this time.  Hgb A1C to be monitored as already arranged by primary service   3. Gastroesophageal reflux disease without esophagitis Continue PPI as already ordered, this medication has been reviewed and there are no changes at this time.  Avoidence of caffeine and alcohol  Moderate elevation of the head of the bed    Current Outpatient Medications on File Prior to Visit  Medication Sig Dispense Refill  . ACCU-CHEK SOFTCLIX LANCETS lancets USE AS DIRECTED TO CHECK BLOOD SUGAR THREE TIMES DAILY AND AT BEDTIME 400 each 1  . albuterol (PROAIR HFA) 108 (90 Base) MCG/ACT inhaler INHALE 2 PUFFS BY MOUTH EVERY 4 HOURS AS NEEDED FOR SHORTNESS OF BREATH (Patient taking differently: Inhale 2 puffs into the lungs every 4 (four) hours as needed for wheezing or shortness of breath. INHALE 2 PUFFS BY MOUTH EVERY 4 HOURS AS NEEDED FOR SHORTNESS OF BREATH) 8.5 g 5  . allopurinol (ZYLOPRIM) 100 MG tablet TAKE 1 TABLET BY MOUTH ONCE DAILY. (Patient taking differently: Take 100 mg by mouth daily. ) 90 tablet 1  . aspirin EC 81 MG EC tablet Take 1 tablet (81 mg total) by mouth daily.    [name]  atorvastatin (LIPITOR) 80 MG tablet TAKE 1 TABLET BY MOUTH ONCE DAILY AT 6PM 28 tablet 10  . carvedilol (COREG) 25 MG tablet TAKE (1) TABLET BY MOUTH TWICE A DAY WITH MEALS (BREAKFAST AND SUPPER) 56 tablet 10  . cephALEXin (KEFLEX) 500 MG capsule Take 1 capsule (500 mg total) by mouth 2 (two) times daily. 14 capsule 0  . docusate sodium (COLACE) 100 MG capsule Take 1 capsule (100 mg total) by mouth 2 (two) times daily as needed for mild constipation. (Patient taking differently: Take 100 mg by mouth daily as needed for mild constipation. ) 30 capsule 0  . fluticasone (FLONASE) 50 MCG/ACT nasal spray Place 2 sprays into both nostrils daily as needed for allergies or rhinitis. 48 g 1  . glucose blood (ACCU-CHEK GUIDE) test strip To check blood sugar before meals and bedtime 400 each 12  . hydrALAZINE (APRESOLINE) 100 MG tablet TAKE (1) TABLET BY MOUTH THREE TIMES DAILY (Patient taking differently: Take 100 mg by mouth 2 (two) times daily. ) 90 tablet 5  . hydrOXYzine (ATARAX/VISTARIL) 25 MG tablet TAKE (1) TABLET BY MOUTH EVERY SIX HOURS AS NEEDED FOR ITCHING. (Patient taking differently: Take 50 mg by mouth every 6 (six) hours as needed for itching. ) 120 tablet 0  . insulin aspart (NOVOLOG FLEXPEN) 100 UNIT/ML FlexPen Inject 25 Units into the skin 3 (three) times daily with meals. 15 mL 11  . Insulin Pen Needle (PEN NEEDLES)  30G X 8 MM MISC 1 each by Does not apply route 3 (three) times daily before meals. AC for novolog injections 100 each 11  . Lancet Devices (ACCU-CHEK SOFTCLIX) lancets Use as instructed daily. 1 each 5  . LANTUS SOLOSTAR 100 UNIT/ML Solostar Pen INJECT 60 UNITS S.Q. TWICE DAILY. (Patient taking differently: Inject 60 Units into the skin 2 (two) times daily. ) 24 mL 11  . liraglutide (VICTOZA) 18 MG/3ML SOPN Start with 0.57m daily and increase by 0.623mper week until max dose of 1.8 mg dose achieved. (Patient taking differently: Inject 1.2 mg into the skin daily. Start with 0.12m3mdaily and increase by 0.12mg28mr week until max dose of 1.8 mg dose achieved.) 1 pen 6  . loratadine (CLARITIN) 10 MG tablet TAKE 1 TABLET BY MOUTH ONCE DAILY. (Patient taking differently: Take 10 mg by mouth daily. ) 90 tablet 1  . mometasone (ELOCON) 0.1 % cream Apply 1 application topically daily. (Patient taking differently: Apply 1 application topically daily as needed (rash). ) 45 g 0  . mometasone-formoterol (DULERA) 200-5 MCG/ACT AERO Inhale 2 puffs into the lungs 2 (two) times daily. (Patient taking differently: Inhale 2 puffs into the lungs 2 (two) times daily as needed for wheezing or shortness of breath. ) 13 g 5  . montelukast (SINGULAIR) 10 MG tablet TAKE ONE TABLET BY MOUTH AT BEDTIME. (Patient taking differently: Take 10 mg by mouth at bedtime. ) 90 tablet 1  . multivitamin (RENA-VIT) TABS tablet Take 1 tablet by mouth daily.    . mupirocin ointment (BACTROBAN) 2 % APPLY INTO THE NOSTRILS TWICE DAILY AS DIRECTED. 22 g 0  . nitroGLYCERIN (NITROSTAT) 0.4 MG SL tablet Place 1 tablet (0.4 mg total) under the tongue every 5 (five) minutes x 3 doses as needed for chest pain. 25 tablet 12  . omeprazole (PRILOSEC) 20 MG capsule Take 1 capsule (20 mg total) by mouth daily. 90 capsule 3  . oxyCODONE-acetaminophen (PERCOCET) 5-325 MG tablet Take 1-2 tablets by mouth every 6 (six) hours as needed for moderate pain or severe pain. 40 tablet 0  . pregabalin (LYRICA) 75 MG capsule Take 1 capsule (75 mg total) by mouth 2 (two) times daily. 180 capsule 1  . SENNA PLUS 8.6-50 MG tablet TAKE ONE TABLET BY MOUTH AT BEDTIME. (Patient taking differently: Take 1 tablet by mouth at bedtime. ) 90 tablet 1  . torsemide (DEMADEX) 20 MG tablet TAKE (2) TABLETS BY MOUTH TWICE DAILY. (Patient taking differently: Take 40 mg by mouth 2 (two) times daily. ) 120 tablet 5  . traMADol (ULTRAM) 50 MG tablet One to Two Tabs Every Six Hours as Needed For Pain 40 tablet 0  . triamcinolone cream (KENALOG) 0.1 % APPLY TO  AFFECTED AREA TWICE DAILY (Patient taking differently: Apply 1 application topically daily as needed (itching). ) 80 g 0  . valACYclovir (VALTREX) 500 MG tablet TAKE (1) TABLET BY MOUTH EVERY OTHER DAY. (Patient taking differently: Take 500 mg by mouth every other day. ) 45 tablet 1  . Vitamin D, Ergocalciferol, (DRISDOL) 50000 units CAPS capsule TAKE 1 CAPSULE BY MOUTH ONCE A MONTH (Patient taking differently: Take 50,000 Units by mouth every 30 (thirty) days. ) 3 capsule 3  . [DISCONTINUED] gabapentin (NEURONTIN) 100 MG capsule Take 1 capsule (100 mg total) by mouth 3 (three) times daily. 90 capsule 1   No current facility-administered medications on file prior to visit.     There are no Patient Instructions on file  for this visit. No follow-ups on file.   Kris Hartmann, NP  This note was completed with Sales executive.  Any errors are purely unintentional.

## 2018-09-20 ENCOUNTER — Other Ambulatory Visit: Payer: Self-pay | Admitting: Physician Assistant

## 2018-09-20 DIAGNOSIS — N186 End stage renal disease: Secondary | ICD-10-CM | POA: Diagnosis not present

## 2018-09-20 DIAGNOSIS — N2581 Secondary hyperparathyroidism of renal origin: Secondary | ICD-10-CM | POA: Diagnosis not present

## 2018-09-20 DIAGNOSIS — Z23 Encounter for immunization: Secondary | ICD-10-CM | POA: Diagnosis not present

## 2018-09-20 DIAGNOSIS — G629 Polyneuropathy, unspecified: Secondary | ICD-10-CM

## 2018-09-20 DIAGNOSIS — K219 Gastro-esophageal reflux disease without esophagitis: Secondary | ICD-10-CM

## 2018-09-20 DIAGNOSIS — E876 Hypokalemia: Secondary | ICD-10-CM

## 2018-09-20 DIAGNOSIS — D509 Iron deficiency anemia, unspecified: Secondary | ICD-10-CM | POA: Diagnosis not present

## 2018-09-20 DIAGNOSIS — J301 Allergic rhinitis due to pollen: Secondary | ICD-10-CM

## 2018-09-20 DIAGNOSIS — D631 Anemia in chronic kidney disease: Secondary | ICD-10-CM | POA: Diagnosis not present

## 2018-09-22 DIAGNOSIS — Z23 Encounter for immunization: Secondary | ICD-10-CM | POA: Diagnosis not present

## 2018-09-22 DIAGNOSIS — D509 Iron deficiency anemia, unspecified: Secondary | ICD-10-CM | POA: Diagnosis not present

## 2018-09-22 DIAGNOSIS — N2581 Secondary hyperparathyroidism of renal origin: Secondary | ICD-10-CM | POA: Diagnosis not present

## 2018-09-22 DIAGNOSIS — D631 Anemia in chronic kidney disease: Secondary | ICD-10-CM | POA: Diagnosis not present

## 2018-09-22 DIAGNOSIS — N186 End stage renal disease: Secondary | ICD-10-CM | POA: Diagnosis not present

## 2018-09-24 DIAGNOSIS — D509 Iron deficiency anemia, unspecified: Secondary | ICD-10-CM | POA: Diagnosis not present

## 2018-09-24 DIAGNOSIS — N186 End stage renal disease: Secondary | ICD-10-CM | POA: Diagnosis not present

## 2018-09-24 DIAGNOSIS — N2581 Secondary hyperparathyroidism of renal origin: Secondary | ICD-10-CM | POA: Diagnosis not present

## 2018-09-24 DIAGNOSIS — D631 Anemia in chronic kidney disease: Secondary | ICD-10-CM | POA: Diagnosis not present

## 2018-09-24 DIAGNOSIS — Z23 Encounter for immunization: Secondary | ICD-10-CM | POA: Diagnosis not present

## 2018-09-26 ENCOUNTER — Other Ambulatory Visit: Payer: Self-pay | Admitting: Physician Assistant

## 2018-09-26 DIAGNOSIS — Z992 Dependence on renal dialysis: Secondary | ICD-10-CM | POA: Diagnosis not present

## 2018-09-26 DIAGNOSIS — H2513 Age-related nuclear cataract, bilateral: Secondary | ICD-10-CM | POA: Diagnosis not present

## 2018-09-26 DIAGNOSIS — L299 Pruritus, unspecified: Secondary | ICD-10-CM

## 2018-09-26 DIAGNOSIS — Z01818 Encounter for other preprocedural examination: Secondary | ICD-10-CM | POA: Diagnosis not present

## 2018-09-27 DIAGNOSIS — D631 Anemia in chronic kidney disease: Secondary | ICD-10-CM | POA: Diagnosis not present

## 2018-09-27 DIAGNOSIS — N186 End stage renal disease: Secondary | ICD-10-CM | POA: Diagnosis not present

## 2018-09-27 DIAGNOSIS — Z23 Encounter for immunization: Secondary | ICD-10-CM | POA: Diagnosis not present

## 2018-09-27 DIAGNOSIS — D509 Iron deficiency anemia, unspecified: Secondary | ICD-10-CM | POA: Diagnosis not present

## 2018-09-27 DIAGNOSIS — N2581 Secondary hyperparathyroidism of renal origin: Secondary | ICD-10-CM | POA: Diagnosis not present

## 2018-09-28 ENCOUNTER — Other Ambulatory Visit: Payer: Self-pay | Admitting: Physician Assistant

## 2018-09-28 DIAGNOSIS — N186 End stage renal disease: Secondary | ICD-10-CM

## 2018-09-28 DIAGNOSIS — Z992 Dependence on renal dialysis: Principal | ICD-10-CM

## 2018-09-28 DIAGNOSIS — E1122 Type 2 diabetes mellitus with diabetic chronic kidney disease: Secondary | ICD-10-CM

## 2018-09-28 DIAGNOSIS — Z794 Long term (current) use of insulin: Secondary | ICD-10-CM

## 2018-09-29 DIAGNOSIS — N186 End stage renal disease: Secondary | ICD-10-CM | POA: Diagnosis not present

## 2018-09-29 DIAGNOSIS — R400 Somnolence: Secondary | ICD-10-CM | POA: Diagnosis not present

## 2018-09-29 DIAGNOSIS — N2581 Secondary hyperparathyroidism of renal origin: Secondary | ICD-10-CM | POA: Diagnosis not present

## 2018-09-29 DIAGNOSIS — D509 Iron deficiency anemia, unspecified: Secondary | ICD-10-CM | POA: Diagnosis not present

## 2018-09-29 DIAGNOSIS — Z23 Encounter for immunization: Secondary | ICD-10-CM | POA: Diagnosis not present

## 2018-09-29 DIAGNOSIS — E1151 Type 2 diabetes mellitus with diabetic peripheral angiopathy without gangrene: Secondary | ICD-10-CM | POA: Diagnosis not present

## 2018-09-29 DIAGNOSIS — D631 Anemia in chronic kidney disease: Secondary | ICD-10-CM | POA: Diagnosis not present

## 2018-10-01 DIAGNOSIS — Z23 Encounter for immunization: Secondary | ICD-10-CM | POA: Diagnosis not present

## 2018-10-01 DIAGNOSIS — N186 End stage renal disease: Secondary | ICD-10-CM | POA: Diagnosis not present

## 2018-10-01 DIAGNOSIS — N2581 Secondary hyperparathyroidism of renal origin: Secondary | ICD-10-CM | POA: Diagnosis not present

## 2018-10-01 DIAGNOSIS — D509 Iron deficiency anemia, unspecified: Secondary | ICD-10-CM | POA: Diagnosis not present

## 2018-10-01 DIAGNOSIS — D631 Anemia in chronic kidney disease: Secondary | ICD-10-CM | POA: Diagnosis not present

## 2018-10-04 DIAGNOSIS — Z23 Encounter for immunization: Secondary | ICD-10-CM | POA: Diagnosis not present

## 2018-10-04 DIAGNOSIS — D509 Iron deficiency anemia, unspecified: Secondary | ICD-10-CM | POA: Diagnosis not present

## 2018-10-04 DIAGNOSIS — N186 End stage renal disease: Secondary | ICD-10-CM | POA: Diagnosis not present

## 2018-10-04 DIAGNOSIS — D631 Anemia in chronic kidney disease: Secondary | ICD-10-CM | POA: Diagnosis not present

## 2018-10-04 DIAGNOSIS — N2581 Secondary hyperparathyroidism of renal origin: Secondary | ICD-10-CM | POA: Diagnosis not present

## 2018-10-07 DIAGNOSIS — D509 Iron deficiency anemia, unspecified: Secondary | ICD-10-CM | POA: Diagnosis not present

## 2018-10-07 DIAGNOSIS — Z23 Encounter for immunization: Secondary | ICD-10-CM | POA: Diagnosis not present

## 2018-10-07 DIAGNOSIS — D631 Anemia in chronic kidney disease: Secondary | ICD-10-CM | POA: Diagnosis not present

## 2018-10-07 DIAGNOSIS — N186 End stage renal disease: Secondary | ICD-10-CM | POA: Diagnosis not present

## 2018-10-07 DIAGNOSIS — N2581 Secondary hyperparathyroidism of renal origin: Secondary | ICD-10-CM | POA: Diagnosis not present

## 2018-10-08 DIAGNOSIS — N2581 Secondary hyperparathyroidism of renal origin: Secondary | ICD-10-CM | POA: Diagnosis not present

## 2018-10-08 DIAGNOSIS — D631 Anemia in chronic kidney disease: Secondary | ICD-10-CM | POA: Diagnosis not present

## 2018-10-08 DIAGNOSIS — D509 Iron deficiency anemia, unspecified: Secondary | ICD-10-CM | POA: Diagnosis not present

## 2018-10-08 DIAGNOSIS — N186 End stage renal disease: Secondary | ICD-10-CM | POA: Diagnosis not present

## 2018-10-08 DIAGNOSIS — Z23 Encounter for immunization: Secondary | ICD-10-CM | POA: Diagnosis not present

## 2018-10-10 ENCOUNTER — Other Ambulatory Visit (INDEPENDENT_AMBULATORY_CARE_PROVIDER_SITE_OTHER): Payer: Self-pay | Admitting: Vascular Surgery

## 2018-10-10 DIAGNOSIS — N186 End stage renal disease: Secondary | ICD-10-CM

## 2018-10-11 DIAGNOSIS — D509 Iron deficiency anemia, unspecified: Secondary | ICD-10-CM | POA: Diagnosis not present

## 2018-10-11 DIAGNOSIS — D631 Anemia in chronic kidney disease: Secondary | ICD-10-CM | POA: Diagnosis not present

## 2018-10-11 DIAGNOSIS — N2581 Secondary hyperparathyroidism of renal origin: Secondary | ICD-10-CM | POA: Diagnosis not present

## 2018-10-11 DIAGNOSIS — Z23 Encounter for immunization: Secondary | ICD-10-CM | POA: Diagnosis not present

## 2018-10-11 DIAGNOSIS — N186 End stage renal disease: Secondary | ICD-10-CM | POA: Diagnosis not present

## 2018-10-12 DIAGNOSIS — E1122 Type 2 diabetes mellitus with diabetic chronic kidney disease: Secondary | ICD-10-CM | POA: Diagnosis not present

## 2018-10-12 DIAGNOSIS — Z992 Dependence on renal dialysis: Secondary | ICD-10-CM | POA: Diagnosis not present

## 2018-10-12 DIAGNOSIS — N186 End stage renal disease: Secondary | ICD-10-CM | POA: Diagnosis not present

## 2018-10-13 ENCOUNTER — Encounter (INDEPENDENT_AMBULATORY_CARE_PROVIDER_SITE_OTHER): Payer: Medicare Other

## 2018-10-13 ENCOUNTER — Ambulatory Visit (INDEPENDENT_AMBULATORY_CARE_PROVIDER_SITE_OTHER): Payer: Medicare Other | Admitting: Vascular Surgery

## 2018-10-13 DIAGNOSIS — D509 Iron deficiency anemia, unspecified: Secondary | ICD-10-CM | POA: Diagnosis not present

## 2018-10-13 DIAGNOSIS — N2581 Secondary hyperparathyroidism of renal origin: Secondary | ICD-10-CM | POA: Diagnosis not present

## 2018-10-13 DIAGNOSIS — N186 End stage renal disease: Secondary | ICD-10-CM | POA: Diagnosis not present

## 2018-10-15 DIAGNOSIS — D509 Iron deficiency anemia, unspecified: Secondary | ICD-10-CM | POA: Diagnosis not present

## 2018-10-15 DIAGNOSIS — N186 End stage renal disease: Secondary | ICD-10-CM | POA: Diagnosis not present

## 2018-10-15 DIAGNOSIS — N2581 Secondary hyperparathyroidism of renal origin: Secondary | ICD-10-CM | POA: Diagnosis not present

## 2018-10-18 DIAGNOSIS — D689 Coagulation defect, unspecified: Secondary | ICD-10-CM | POA: Insufficient documentation

## 2018-10-18 DIAGNOSIS — N186 End stage renal disease: Secondary | ICD-10-CM | POA: Diagnosis not present

## 2018-10-18 DIAGNOSIS — N2581 Secondary hyperparathyroidism of renal origin: Secondary | ICD-10-CM | POA: Diagnosis not present

## 2018-10-18 DIAGNOSIS — D509 Iron deficiency anemia, unspecified: Secondary | ICD-10-CM | POA: Diagnosis not present

## 2018-10-20 ENCOUNTER — Encounter (INDEPENDENT_AMBULATORY_CARE_PROVIDER_SITE_OTHER): Payer: Self-pay | Admitting: Nurse Practitioner

## 2018-10-20 ENCOUNTER — Ambulatory Visit (INDEPENDENT_AMBULATORY_CARE_PROVIDER_SITE_OTHER): Payer: Medicare Other

## 2018-10-20 ENCOUNTER — Ambulatory Visit (INDEPENDENT_AMBULATORY_CARE_PROVIDER_SITE_OTHER): Payer: Medicare Other | Admitting: Vascular Surgery

## 2018-10-20 ENCOUNTER — Other Ambulatory Visit: Payer: Self-pay | Admitting: Physician Assistant

## 2018-10-20 ENCOUNTER — Other Ambulatory Visit: Payer: Self-pay

## 2018-10-20 VITALS — BP 175/72 | HR 85 | Resp 16 | Ht 68.0 in | Wt 293.0 lb

## 2018-10-20 DIAGNOSIS — T829XXS Unspecified complication of cardiac and vascular prosthetic device, implant and graft, sequela: Secondary | ICD-10-CM

## 2018-10-20 DIAGNOSIS — I120 Hypertensive chronic kidney disease with stage 5 chronic kidney disease or end stage renal disease: Secondary | ICD-10-CM

## 2018-10-20 DIAGNOSIS — I10 Essential (primary) hypertension: Secondary | ICD-10-CM

## 2018-10-20 DIAGNOSIS — N2581 Secondary hyperparathyroidism of renal origin: Secondary | ICD-10-CM | POA: Diagnosis not present

## 2018-10-20 DIAGNOSIS — N186 End stage renal disease: Secondary | ICD-10-CM

## 2018-10-20 DIAGNOSIS — N184 Chronic kidney disease, stage 4 (severe): Secondary | ICD-10-CM

## 2018-10-20 DIAGNOSIS — Z794 Long term (current) use of insulin: Secondary | ICD-10-CM

## 2018-10-20 DIAGNOSIS — I2510 Atherosclerotic heart disease of native coronary artery without angina pectoris: Secondary | ICD-10-CM

## 2018-10-20 DIAGNOSIS — Z992 Dependence on renal dialysis: Secondary | ICD-10-CM

## 2018-10-20 DIAGNOSIS — IMO0002 Reserved for concepts with insufficient information to code with codable children: Secondary | ICD-10-CM

## 2018-10-20 DIAGNOSIS — T829XXA Unspecified complication of cardiac and vascular prosthetic device, implant and graft, initial encounter: Secondary | ICD-10-CM | POA: Insufficient documentation

## 2018-10-20 DIAGNOSIS — Z79899 Other long term (current) drug therapy: Secondary | ICD-10-CM

## 2018-10-20 DIAGNOSIS — E1122 Type 2 diabetes mellitus with diabetic chronic kidney disease: Secondary | ICD-10-CM

## 2018-10-20 DIAGNOSIS — D509 Iron deficiency anemia, unspecified: Secondary | ICD-10-CM | POA: Diagnosis not present

## 2018-10-20 DIAGNOSIS — E118 Type 2 diabetes mellitus with unspecified complications: Secondary | ICD-10-CM

## 2018-10-20 DIAGNOSIS — E1165 Type 2 diabetes mellitus with hyperglycemia: Secondary | ICD-10-CM

## 2018-10-20 DIAGNOSIS — E1129 Type 2 diabetes mellitus with other diabetic kidney complication: Secondary | ICD-10-CM | POA: Diagnosis not present

## 2018-10-20 DIAGNOSIS — D631 Anemia in chronic kidney disease: Secondary | ICD-10-CM | POA: Diagnosis not present

## 2018-10-20 DIAGNOSIS — Z87891 Personal history of nicotine dependence: Secondary | ICD-10-CM

## 2018-10-20 DIAGNOSIS — E785 Hyperlipidemia, unspecified: Secondary | ICD-10-CM

## 2018-10-20 NOTE — Progress Notes (Signed)
MRN : [id number]  [name] is a 63 y.o. (09-17-1955) female who presents with chief complaint of  Chief Complaint  Patient presents with   Follow-up    4week HDA  .  History of Present Illness:    The patient is seen for evaluation of dialysis access.  Brachial Basilic fistula was created 08/19/2018.   Current access is via a catheter which is functioning well.  There have not been any episodes of catheter infection.  The patient denies amaurosis fugax or recent TIA symptoms. There are no recent neurological changes noted. The patient denies claudication symptoms or rest pain symptoms. The patient denies history of DVT, PE or superficial thrombophlebitis. The patient denies recent episodes of angina or shortness of breath.   Duplex ultrasound shows a patent fistula with vein diameters of 0.7 and flow volumes of 1900  Current Meds  Medication Sig   Accu-Chek FastClix Lancets MISC USE AS DIRECTED TO CHECK BLOOD SUGAR THREE TIMES DAILY AND AT BEDTIME   albuterol (PROAIR HFA) 108 (90 Base) MCG/ACT inhaler INHALE 2 PUFFS BY MOUTH EVERY 4 HOURS AS NEEDED FOR SHORTNESS OF BREATH (Patient taking differently: Inhale 2 puffs into the lungs every 4 (four) hours as needed for wheezing or shortness of breath. INHALE 2 PUFFS BY MOUTH EVERY 4 HOURS AS NEEDED FOR SHORTNESS OF BREATH)   allopurinol (ZYLOPRIM) 100 MG tablet TAKE 1 TABLET BY MOUTH ONCE DAILY. (Patient taking differently: Take 100 mg by mouth daily. )   aspirin EC 81 MG EC tablet Take 1 tablet (81 mg total) by mouth daily.   atorvastatin (LIPITOR) 80 MG tablet TAKE 1 TABLET BY MOUTH ONCE DAILY AT 6PM   carvedilol (COREG) 25 MG tablet TAKE (1) TABLET BY MOUTH TWICE A DAY WITH MEALS (BREAKFAST AND SUPPER)   cephALEXin (KEFLEX) 500 MG capsule Take 1 capsule (500 mg total) by mouth 2 (two) times daily.   docusate sodium (COLACE) 100 MG capsule Take 1 capsule (100 mg total) by mouth 2 (two) times daily as needed for mild  constipation. (Patient taking differently: Take 100 mg by mouth daily as needed for mild constipation. )   fluticasone (FLONASE) 50 MCG/ACT nasal spray Place 2 sprays into both nostrils daily as needed for allergies or rhinitis.   glucose blood (ACCU-CHEK GUIDE) test strip To check blood sugar before meals and bedtime   hydrALAZINE (APRESOLINE) 100 MG tablet TAKE (1) TABLET BY MOUTH THREE TIMES DAILY (Patient taking differently: Take 100 mg by mouth 2 (two) times daily. )   hydrOXYzine (ATARAX/VISTARIL) 25 MG tablet TAKE (1) TABLET BY MOUTH EVERY SIX HOURS AS NEEDED FOR ITCHING.   insulin aspart (NOVOLOG FLEXPEN) 100 UNIT/ML FlexPen Inject 25 Units into the skin 3 (three) times daily with meals.   Insulin Pen Needle (PEN NEEDLES) 30G X 8 MM MISC 1 each by Does not apply route 3 (three) times daily before meals. AC for novolog injections   Lancet Devices (ACCU-CHEK SOFTCLIX) lancets Use as instructed daily.   LANTUS SOLOSTAR 100 UNIT/ML Solostar Pen INJECT 60 UNITS S.Q. TWICE DAILY. (Patient taking differently: Inject 60 Units into the skin 2 (two) times daily. )   liraglutide (VICTOZA) 18 MG/3ML SOPN Start with 0.3m daily and increase by 0.614mper week until max dose of 1.8 mg dose achieved. (Patient taking differently: Inject 1.2 mg into the skin daily. Start with 0.63m40maily and increase by 0.63mg81mr week until max dose of 1.8 mg dose achieved.)   loratadine (CLARITIN)  10 MG tablet TAKE 1 TABLET BY MOUTH ONCE DAILY.   mometasone (ELOCON) 0.1 % cream Apply 1 application topically daily. (Patient taking differently: Apply 1 application topically daily as needed (rash). )   mometasone-formoterol (DULERA) 200-5 MCG/ACT AERO Inhale 2 puffs into the lungs 2 (two) times daily. (Patient taking differently: Inhale 2 puffs into the lungs 2 (two) times daily as needed for wheezing or shortness of breath. )   montelukast (SINGULAIR) 10 MG tablet TAKE ONE TABLET BY MOUTH AT BEDTIME. (Patient taking  differently: Take 10 mg by mouth at bedtime. )   multivitamin (RENA-VIT) TABS tablet Take 1 tablet by mouth daily.   mupirocin ointment (BACTROBAN) 2 % APPLY INTO THE NOSTRILS TWICE DAILY AS DIRECTED.   nitroGLYCERIN (NITROSTAT) 0.4 MG SL tablet Place 1 tablet (0.4 mg total) under the tongue every 5 (five) minutes x 3 doses as needed for chest pain.   omeprazole (PRILOSEC) 20 MG capsule TAKE (1) CAPSULE BY MOUTH ONCE DAILY.   oxyCODONE-acetaminophen (PERCOCET) 5-325 MG tablet Take 1-2 tablets by mouth every 6 (six) hours as needed for moderate pain or severe pain.   potassium chloride SA (K-DUR,KLOR-CON) 20 MEQ tablet TAKE 1 TABLET BY MOUTH TWICE DAILY   pregabalin (LYRICA) 75 MG capsule Take 1 capsule (75 mg total) by mouth 2 (two) times daily.   SENNA PLUS 8.6-50 MG tablet TAKE ONE TABLET BY MOUTH AT BEDTIME.   silver sulfADIAZINE (SILVADENE) 1 % cream Apply 1 application topically daily.   torsemide (DEMADEX) 20 MG tablet TAKE (2) TABLETS BY MOUTH TWICE DAILY. (Patient taking differently: Take 40 mg by mouth 2 (two) times daily. )   traMADol (ULTRAM) 50 MG tablet One to Two Tabs Every Six Hours as Needed For Pain   triamcinolone cream (KENALOG) 0.1 % APPLY TO AFFECTED AREA TWICE DAILY (Patient taking differently: Apply 1 application topically daily as needed (itching). )   valACYclovir (VALTREX) 500 MG tablet TAKE (1) TABLET BY MOUTH EVERY OTHER DAY. (Patient taking differently: Take 500 mg by mouth every other day. )   Vitamin D, Ergocalciferol, (DRISDOL) 50000 units CAPS capsule TAKE 1 CAPSULE BY MOUTH ONCE A MONTH (Patient taking differently: Take 50,000 Units by mouth every 30 (thirty) days. )    Past Medical History:  Diagnosis Date   Anemia    Aortic stenosis    Echo 8/18: mean 13, peak 28, LVOT/AV mean velocity 0.51   Arthritis    Asthma    As a child    Bronchitis    CAD (coronary artery disease)    a. 09/2016: 50% Ost 1st Mrg stenosis, 50% 2nd Mrg stenosis,  20% Mid-Cx, 95% Prox LAD, 40% mid-LAD, and 10% dist-LAD stenosis. Staged PCI with DES to Prox-LAD.    Chronic combined systolic and diastolic CHF (congestive heart failure) ([place]) 2011   echo 2/18: EF 55-60, normal wall motion, grade 2 diastolic dysfunction, trivial AI // echo 3/18: Septal and apical HK, EF 45-50, normal wall motion, trivial AI, mild LAE, PASP 38 // echo 8/18: EF 60-65, normal wall motion, grade 1 diastolic dysfunction, calcified aortic valve leaflets, mild aortic stenosis (mean 13, peak 28, LVOT/AV mean velocity 0.51), mild AI, moderate MAC, mild LAE, trivial TR    Chronic kidney disease    STAGE 4   Depression    Diabetes mellitus Dx 1989   GERD (gastroesophageal reflux disease)    Gout    Hepatitis C Dx 2013   Hypertension Dx 1989   Myocardial infarction ([place]) 07/2015  Obesity    Obstructive sleep apnea    Pancreatitis 2013   Pneumonia    Refusal of blood transfusions as patient is Jehovah's Witness    Tendinitis    Tremors of nervous system    LEFT HAND   Ulcer 2010    Past Surgical History:  Procedure Laterality Date   APPLICATION OF WOUND VAC Left 08/14/2017   Procedure: APPLICATION OF WOUND VAC Exchange;  Surgeon: Robert Bellow, MD;  Location: [facility];  Service: General;  Laterality: Left;   AV FISTULA PLACEMENT Left 08/19/2018   Procedure: ARTERIOVENOUS (AV) FISTULA CREATION ( BRACHIOBASILIC );  Surgeon: Katha Cabal, MD;  Location: [facility];  Service: Vascular;  Laterality: Left;   CHOLECYSTECTOMY     COLONOSCOPY WITH PROPOFOL N/A 02/03/2018   Procedure: COLONOSCOPY WITH PROPOFOL;  Surgeon: Lin Landsman, MD;  Location: [facility];  Service: Gastroenterology;  Laterality: N/A;   CORONARY ANGIOPLASTY  07/2015   STENT   CORONARY STENT INTERVENTION N/A 09/18/2016   Procedure: Coronary Stent Intervention;  Surgeon: Troy Sine, MD;  Location: [place] [facility];  Service: Cardiovascular;  Laterality: N/A;    DIALYSIS/PERMA CATHETER INSERTION N/A 05/10/2018   Procedure: DIALYSIS/PERMA CATHETER INSERTION;  Surgeon: Katha Cabal, MD;  Location: [place] [facility];  Service: Cardiovascular;  Laterality: N/A;   DRESSING CHANGE UNDER ANESTHESIA Left 08/15/2017   Procedure: exploration of wound for bleeding;  Surgeon: Robert Bellow, MD;  Location: [facility];  Service: General;  Laterality: Left;   ESOPHAGOGASTRODUODENOSCOPY (EGD) WITH PROPOFOL N/A 02/03/2018   Procedure: ESOPHAGOGASTRODUODENOSCOPY (EGD) WITH PROPOFOL;  Surgeon: Lin Landsman, MD;  Location: [facility];  Service: Gastroenterology;  Laterality: N/A;   EYE SURGERY     INCISION AND DRAINAGE ABSCESS Left 08/12/2017   Procedure: INCISION AND DRAINAGE ABSCESS;  Surgeon: Robert Bellow, MD;  Location: [facility];  Service: General;  Laterality: Left;   KNEE ARTHROSCOPY     LEFT HEART CATH N/A 09/18/2016   Procedure: Left Heart Cath;  Surgeon: Troy Sine, MD;  Location: [place] [facility];  Service: Cardiovascular;  Laterality: N/A;   LEFT HEART CATH AND CORONARY ANGIOGRAPHY N/A 09/16/2016   Procedure: Left Heart Cath and Coronary Angiography;  Surgeon: Burnell Blanks, MD;  Location: [place] [facility];  Service: Cardiovascular;  Laterality: N/A;   LEFT HEART CATH AND CORONARY ANGIOGRAPHY N/A 04/29/2017   Procedure: LEFT HEART CATH AND CORONARY ANGIOGRAPHY;  Surgeon: Nelva Bush, MD;  Location: [place] [facility];  Service: Cardiovascular;  Laterality: N/A;   LOWER EXTREMITY ANGIOGRAPHY Right 03/08/2018   Procedure: LOWER EXTREMITY ANGIOGRAPHY;  Surgeon: Katha Cabal, MD;  Location: [place] [facility];  Service: Cardiovascular;  Laterality: Right;   TUBAL LIGATION     TUBAL LIGATION      Social History Social History   Tobacco Use   Smoking status: Former Smoker    Types: Cigarettes    Last attempt to quit: 10/25/1980    Years since quitting: 38.0   Smokeless tobacco: Never Used    Tobacco comment: quit smoking in 1982  Substance Use Topics   Alcohol use: Yes    Comment: rarely   Drug use: Not Currently    Types: "Crack" cocaine    Comment: 08/21/2016 "clean since 05/1998"    Family History Family History  Problem Relation Age of Onset   Colon cancer Mother    Heart attack Other    Heart attack Maternal Grandmother  Hypertension Sister    Hypertension Brother    Diabetes Paternal Grandmother    Breast cancer Neg Hx     Allergies  Allergen Reactions   Shellfish Allergy Anaphylaxis and Swelling   Diazepam Other (See Comments)    "felt like out of body experience"   Morphine And Related Itching     REVIEW OF SYSTEMS (Negative unless checked)  Constitutional: _0 Weight loss  _1 Fever  _2 Chills Cardiac: _3 Chest pain   _4 Chest pressure   _5 Palpitations   _6 Shortness of breath when laying flat   _7 Shortness of breath with exertion. Vascular:  _8 Pain in legs with walking   _9 Pain in legs at rest  _10 History of DVT   _11 Phlebitis   _12 Swelling in legs   _13 Varicose veins   _14 Non-healing ulcers Pulmonary:   _15 Uses home oxygen   _16 Productive cough   _17 Hemoptysis   _18 Wheeze  _19 COPD   _20 Asthma Neurologic:  _21 Dizziness   _22 Seizures   _23 History of stroke   _24 History of TIA  _25 Aphasia   _26 Vissual changes   _27 Weakness or numbness in arm   _28 Weakness or numbness in leg Musculoskeletal:   _29 Joint swelling   _30 Joint pain   _31 Low back pain Hematologic:  _32 Easy bruising  _33 Easy bleeding   _34 Hypercoagulable state   _35 Anemic Gastrointestinal:  _36 Diarrhea   _37 Vomiting  _38 Gastroesophageal reflux/heartburn   _39 Difficulty swallowing. Genitourinary:  _40 Chronic kidney disease   _41 Difficult urination  _42 Frequent urination   _43 Blood in urine Skin:  _44 Rashes   _45 Ulcers  Psychological:  _46 History of anxiety   _47  History of major depression.  Physical Examination  Vitals:   10/20/18 0830  BP: (!) 175/72  Pulse: 85  Resp: 16  Weight: 293 lb (132.9 kg)  Height: _48   (1.727 m)   Body mass index is 44.55 kg/m. Gen: WD/WN, NAD Head: [place]/AT, No temporalis wasting.  Ear/Nose/Throat: Hearing grossly intact, nares w/o erythema or drainage Eyes: PER, EOMI, sclera nonicteric.  Neck: Supple, no large masses.   Pulmonary:  Good air movement, no audible wheezing bilaterally, no use of accessory muscles.  Cardiac: RRR, no JVD Vascular:  Good thrill good bruit at antecubital fossa.  Arm is large. Vessel Right Left  Radial Palpable Palpable  Ulnar Palpable Not Palpable  Brachial Palpable Palpable  Gastrointestinal: Non-distended. No guarding/no peritoneal signs.  Musculoskeletal: M/S 5/5 throughout.  No deformity or atrophy.  Neurologic: CN 2-12 intact. Symmetrical.  Speech is fluent. Motor exam as listed above. Psychiatric: Judgment intact, Mood & affect appropriate for pt's clinical situation. Dermatologic: No rashes or ulcers noted.  No changes consistent with cellulitis. Lymph : No lichenification or skin changes of chronic lymphedema.  CBC Lab Results  Component Value Date   WBC 6.0 08/10/2018   HGB 10.5 (L) 08/19/2018   HCT 31.0 (L) 08/19/2018   MCV 92.3 08/10/2018   PLT 200 08/10/2018    BMET    Component Value Date/Time   NA 136 08/19/2018 0656   NA 142 01/27/2018 1353   K 3.5 08/19/2018 0656   CL 97 (L) 08/10/2018 1208   CO2 29 08/10/2018 1208   GLUCOSE 400 (H) 08/19/2018 0656   BUN 42 (H) 08/10/2018 1208   BUN 82 (HH) 01/27/2018 1353   CREATININE 3.57 (H) 08/10/2018 1208   CREATININE 1.36 (H) 09/23/2016 1451   CALCIUM 7.9 (L) 08/10/2018 1208   GFRNONAA 13 (L) 08/10/2018 1208   GFRNONAA 43 (L) 09/23/2016 1451   GFRAA 15 (L) 08/10/2018 1208   GFRAA 49 (L) 09/23/2016 1451   CrCl  cannot be calculated (Patient's most recent lab result is older than the maximum 21 days allowed.).  COAG Lab Results  Component Value Date   INR 1.02 08/10/2018   INR 1.02 12/14/2017   INR 1.01 10/03/2017    Radiology No results  found.   Assessment/Plan 1. Complication from renal dialysis device, sequela Recommend:  At this time the patient does not have appropriate extremity access for dialysis  Patient should have her basilic vein transposed to complete her access.  Given the Covid crisis I will plan for this in June, 20220.  The risks, benefits and alternative therapies were reviewed in detail with the patient.  All questions were answered.  The patient agrees to proceed with surgery.    2. ESRD on dialysis [facility]) The patient is status post successful stage one of basilic transposition.  The access remains patent with improved flow dynamics compared with the previous noninvasive study.  Continue to use the tunneled access for dialysis, continue dialysis without interruption.   3. Hyperlipidemia, unspecified hyperlipidemia type Continue statin as ordered and reviewed, no changes at this time   4. Diabetes mellitus type 2, uncontrolled, with complications ([place]) Continue hypoglycemic medications as already ordered, these medications have been reviewed and there are no changes at this time.  Hgb A1C to be monitored as already arranged by primary service   5. Essential hypertension Continue antihypertensive medications as already ordered, these medications have been reviewed and there are no changes at this time.   Hortencia Pilar, MD  10/20/2018 8:53 AM

## 2018-10-22 DIAGNOSIS — D631 Anemia in chronic kidney disease: Secondary | ICD-10-CM | POA: Diagnosis not present

## 2018-10-22 DIAGNOSIS — E1129 Type 2 diabetes mellitus with other diabetic kidney complication: Secondary | ICD-10-CM | POA: Diagnosis not present

## 2018-10-22 DIAGNOSIS — D509 Iron deficiency anemia, unspecified: Secondary | ICD-10-CM | POA: Diagnosis not present

## 2018-10-22 DIAGNOSIS — N2581 Secondary hyperparathyroidism of renal origin: Secondary | ICD-10-CM | POA: Diagnosis not present

## 2018-10-22 DIAGNOSIS — N186 End stage renal disease: Secondary | ICD-10-CM | POA: Diagnosis not present

## 2018-10-25 DIAGNOSIS — D631 Anemia in chronic kidney disease: Secondary | ICD-10-CM | POA: Diagnosis not present

## 2018-10-25 DIAGNOSIS — N186 End stage renal disease: Secondary | ICD-10-CM | POA: Diagnosis not present

## 2018-10-25 DIAGNOSIS — E1129 Type 2 diabetes mellitus with other diabetic kidney complication: Secondary | ICD-10-CM | POA: Diagnosis not present

## 2018-10-25 DIAGNOSIS — D509 Iron deficiency anemia, unspecified: Secondary | ICD-10-CM | POA: Diagnosis not present

## 2018-10-25 DIAGNOSIS — N2581 Secondary hyperparathyroidism of renal origin: Secondary | ICD-10-CM | POA: Diagnosis not present

## 2018-10-27 DIAGNOSIS — N2581 Secondary hyperparathyroidism of renal origin: Secondary | ICD-10-CM | POA: Diagnosis not present

## 2018-10-27 DIAGNOSIS — N186 End stage renal disease: Secondary | ICD-10-CM | POA: Diagnosis not present

## 2018-10-27 DIAGNOSIS — D509 Iron deficiency anemia, unspecified: Secondary | ICD-10-CM | POA: Diagnosis not present

## 2018-10-27 DIAGNOSIS — E1129 Type 2 diabetes mellitus with other diabetic kidney complication: Secondary | ICD-10-CM | POA: Diagnosis not present

## 2018-10-27 DIAGNOSIS — D631 Anemia in chronic kidney disease: Secondary | ICD-10-CM | POA: Diagnosis not present

## 2018-10-28 ENCOUNTER — Other Ambulatory Visit: Payer: Self-pay | Admitting: Physician Assistant

## 2018-10-28 ENCOUNTER — Encounter: Payer: Self-pay | Admitting: Physician Assistant

## 2018-10-28 ENCOUNTER — Telehealth: Payer: Self-pay

## 2018-10-28 DIAGNOSIS — L299 Pruritus, unspecified: Secondary | ICD-10-CM

## 2018-10-28 NOTE — Telephone Encounter (Signed)
[name] can you call [facility] and have them D/C isorobide, iron and potassium. These were stopped by her nephrologist.  Thanks

## 2018-10-28 NOTE — Telephone Encounter (Signed)
Patient requesting a call back from [facility], she declined to say what the concern was. KW

## 2018-10-29 DIAGNOSIS — N2581 Secondary hyperparathyroidism of renal origin: Secondary | ICD-10-CM | POA: Diagnosis not present

## 2018-10-29 DIAGNOSIS — D509 Iron deficiency anemia, unspecified: Secondary | ICD-10-CM | POA: Diagnosis not present

## 2018-10-29 DIAGNOSIS — N186 End stage renal disease: Secondary | ICD-10-CM | POA: Diagnosis not present

## 2018-10-29 DIAGNOSIS — D631 Anemia in chronic kidney disease: Secondary | ICD-10-CM | POA: Diagnosis not present

## 2018-10-29 DIAGNOSIS — E1129 Type 2 diabetes mellitus with other diabetic kidney complication: Secondary | ICD-10-CM | POA: Diagnosis not present

## 2018-10-31 NOTE — Telephone Encounter (Signed)
Patient's sent a mychart message to provider.

## 2018-11-01 DIAGNOSIS — E1129 Type 2 diabetes mellitus with other diabetic kidney complication: Secondary | ICD-10-CM | POA: Diagnosis not present

## 2018-11-01 DIAGNOSIS — N2581 Secondary hyperparathyroidism of renal origin: Secondary | ICD-10-CM | POA: Diagnosis not present

## 2018-11-01 DIAGNOSIS — N186 End stage renal disease: Secondary | ICD-10-CM | POA: Diagnosis not present

## 2018-11-01 DIAGNOSIS — D509 Iron deficiency anemia, unspecified: Secondary | ICD-10-CM | POA: Diagnosis not present

## 2018-11-01 DIAGNOSIS — D631 Anemia in chronic kidney disease: Secondary | ICD-10-CM | POA: Diagnosis not present

## 2018-11-03 DIAGNOSIS — N186 End stage renal disease: Secondary | ICD-10-CM | POA: Diagnosis not present

## 2018-11-03 DIAGNOSIS — E1129 Type 2 diabetes mellitus with other diabetic kidney complication: Secondary | ICD-10-CM | POA: Diagnosis not present

## 2018-11-03 DIAGNOSIS — D509 Iron deficiency anemia, unspecified: Secondary | ICD-10-CM | POA: Diagnosis not present

## 2018-11-03 DIAGNOSIS — N2581 Secondary hyperparathyroidism of renal origin: Secondary | ICD-10-CM | POA: Diagnosis not present

## 2018-11-03 DIAGNOSIS — D631 Anemia in chronic kidney disease: Secondary | ICD-10-CM | POA: Diagnosis not present

## 2018-11-04 DIAGNOSIS — N186 End stage renal disease: Secondary | ICD-10-CM | POA: Diagnosis not present

## 2018-11-04 DIAGNOSIS — D631 Anemia in chronic kidney disease: Secondary | ICD-10-CM | POA: Diagnosis not present

## 2018-11-04 DIAGNOSIS — D509 Iron deficiency anemia, unspecified: Secondary | ICD-10-CM | POA: Diagnosis not present

## 2018-11-04 DIAGNOSIS — N2581 Secondary hyperparathyroidism of renal origin: Secondary | ICD-10-CM | POA: Diagnosis not present

## 2018-11-04 DIAGNOSIS — E1129 Type 2 diabetes mellitus with other diabetic kidney complication: Secondary | ICD-10-CM | POA: Diagnosis not present

## 2018-11-08 ENCOUNTER — Telehealth (INDEPENDENT_AMBULATORY_CARE_PROVIDER_SITE_OTHER): Payer: Self-pay

## 2018-11-08 DIAGNOSIS — E1129 Type 2 diabetes mellitus with other diabetic kidney complication: Secondary | ICD-10-CM | POA: Diagnosis not present

## 2018-11-08 DIAGNOSIS — N186 End stage renal disease: Secondary | ICD-10-CM | POA: Diagnosis not present

## 2018-11-08 DIAGNOSIS — D509 Iron deficiency anemia, unspecified: Secondary | ICD-10-CM | POA: Diagnosis not present

## 2018-11-08 DIAGNOSIS — N2581 Secondary hyperparathyroidism of renal origin: Secondary | ICD-10-CM | POA: Diagnosis not present

## 2018-11-08 DIAGNOSIS — D631 Anemia in chronic kidney disease: Secondary | ICD-10-CM | POA: Diagnosis not present

## 2018-11-08 NOTE — Telephone Encounter (Signed)
I attempted to contact the patient to schedule her surgery with Dr. Delana Meyer. I left a message for a return call.

## 2018-11-10 DIAGNOSIS — D509 Iron deficiency anemia, unspecified: Secondary | ICD-10-CM | POA: Diagnosis not present

## 2018-11-10 DIAGNOSIS — E1129 Type 2 diabetes mellitus with other diabetic kidney complication: Secondary | ICD-10-CM | POA: Diagnosis not present

## 2018-11-10 DIAGNOSIS — N186 End stage renal disease: Secondary | ICD-10-CM | POA: Diagnosis not present

## 2018-11-10 DIAGNOSIS — D631 Anemia in chronic kidney disease: Secondary | ICD-10-CM | POA: Diagnosis not present

## 2018-11-10 DIAGNOSIS — N2581 Secondary hyperparathyroidism of renal origin: Secondary | ICD-10-CM | POA: Diagnosis not present

## 2018-11-11 DIAGNOSIS — E1122 Type 2 diabetes mellitus with diabetic chronic kidney disease: Secondary | ICD-10-CM | POA: Diagnosis not present

## 2018-11-11 DIAGNOSIS — Z992 Dependence on renal dialysis: Secondary | ICD-10-CM | POA: Diagnosis not present

## 2018-11-11 DIAGNOSIS — N186 End stage renal disease: Secondary | ICD-10-CM | POA: Diagnosis not present

## 2018-11-12 DIAGNOSIS — S41102A Unspecified open wound of left upper arm, initial encounter: Secondary | ICD-10-CM | POA: Diagnosis not present

## 2018-11-12 DIAGNOSIS — D631 Anemia in chronic kidney disease: Secondary | ICD-10-CM | POA: Diagnosis not present

## 2018-11-12 DIAGNOSIS — D509 Iron deficiency anemia, unspecified: Secondary | ICD-10-CM | POA: Diagnosis not present

## 2018-11-12 DIAGNOSIS — N2581 Secondary hyperparathyroidism of renal origin: Secondary | ICD-10-CM | POA: Diagnosis not present

## 2018-11-12 DIAGNOSIS — N186 End stage renal disease: Secondary | ICD-10-CM | POA: Diagnosis not present

## 2018-11-14 NOTE — Telephone Encounter (Signed)
Spoke with the patient today and I will attempt to get the patient an appt for pre-op tomorrow and surgery on Wednesday with Dr. Delana Meyer

## 2018-11-15 ENCOUNTER — Encounter (INDEPENDENT_AMBULATORY_CARE_PROVIDER_SITE_OTHER): Payer: Self-pay

## 2018-11-15 ENCOUNTER — Other Ambulatory Visit (INDEPENDENT_AMBULATORY_CARE_PROVIDER_SITE_OTHER): Payer: Self-pay | Admitting: Nurse Practitioner

## 2018-11-15 DIAGNOSIS — D509 Iron deficiency anemia, unspecified: Secondary | ICD-10-CM | POA: Diagnosis not present

## 2018-11-15 DIAGNOSIS — Z1159 Encounter for screening for other viral diseases: Secondary | ICD-10-CM | POA: Diagnosis not present

## 2018-11-15 DIAGNOSIS — D631 Anemia in chronic kidney disease: Secondary | ICD-10-CM | POA: Diagnosis not present

## 2018-11-15 DIAGNOSIS — N2581 Secondary hyperparathyroidism of renal origin: Secondary | ICD-10-CM | POA: Diagnosis not present

## 2018-11-15 DIAGNOSIS — N186 End stage renal disease: Secondary | ICD-10-CM | POA: Diagnosis not present

## 2018-11-15 DIAGNOSIS — S41102A Unspecified open wound of left upper arm, initial encounter: Secondary | ICD-10-CM | POA: Diagnosis not present

## 2018-11-15 NOTE — Telephone Encounter (Signed)
Spoke with the patient and she is scheduled for surgery with Dr. Delana Meyer on 11/18/2018 with pre-op being on 11/16/2018. This information will be faxed to the patient's dialysis center.

## 2018-11-16 ENCOUNTER — Other Ambulatory Visit: Payer: Self-pay

## 2018-11-16 ENCOUNTER — Encounter
Admission: RE | Admit: 2018-11-16 | Discharge: 2018-11-16 | Disposition: A | Discharge status: Home / Self Care | Payer: Medicare Other | Source: Ambulatory Visit | Attending: Vascular Surgery | Admitting: Vascular Surgery

## 2018-11-16 DIAGNOSIS — I5042 Chronic combined systolic (congestive) and diastolic (congestive) heart failure: Secondary | ICD-10-CM | POA: Diagnosis not present

## 2018-11-16 DIAGNOSIS — B1920 Unspecified viral hepatitis C without hepatic coma: Secondary | ICD-10-CM | POA: Diagnosis not present

## 2018-11-16 DIAGNOSIS — E1140 Type 2 diabetes mellitus with diabetic neuropathy, unspecified: Secondary | ICD-10-CM | POA: Diagnosis not present

## 2018-11-16 DIAGNOSIS — E1122 Type 2 diabetes mellitus with diabetic chronic kidney disease: Secondary | ICD-10-CM | POA: Diagnosis not present

## 2018-11-16 DIAGNOSIS — K219 Gastro-esophageal reflux disease without esophagitis: Secondary | ICD-10-CM | POA: Diagnosis not present

## 2018-11-16 DIAGNOSIS — Z885 Allergy status to narcotic agent status: Secondary | ICD-10-CM | POA: Diagnosis not present

## 2018-11-16 DIAGNOSIS — Z794 Long term (current) use of insulin: Secondary | ICD-10-CM | POA: Diagnosis not present

## 2018-11-16 DIAGNOSIS — J449 Chronic obstructive pulmonary disease, unspecified: Secondary | ICD-10-CM | POA: Diagnosis not present

## 2018-11-16 DIAGNOSIS — M109 Gout, unspecified: Secondary | ICD-10-CM | POA: Diagnosis not present

## 2018-11-16 DIAGNOSIS — D631 Anemia in chronic kidney disease: Secondary | ICD-10-CM | POA: Diagnosis not present

## 2018-11-16 DIAGNOSIS — I2510 Atherosclerotic heart disease of native coronary artery without angina pectoris: Secondary | ICD-10-CM | POA: Diagnosis not present

## 2018-11-16 DIAGNOSIS — M1990 Unspecified osteoarthritis, unspecified site: Secondary | ICD-10-CM | POA: Diagnosis not present

## 2018-11-16 DIAGNOSIS — G4733 Obstructive sleep apnea (adult) (pediatric): Secondary | ICD-10-CM | POA: Diagnosis not present

## 2018-11-16 DIAGNOSIS — E1100 Type 2 diabetes mellitus with hyperosmolarity without nonketotic hyperglycemic-hyperosmolar coma (NKHHC): Secondary | ICD-10-CM | POA: Diagnosis not present

## 2018-11-16 DIAGNOSIS — E1165 Type 2 diabetes mellitus with hyperglycemia: Secondary | ICD-10-CM | POA: Diagnosis not present

## 2018-11-16 DIAGNOSIS — N186 End stage renal disease: Secondary | ICD-10-CM | POA: Diagnosis not present

## 2018-11-16 DIAGNOSIS — Z48812 Encounter for surgical aftercare following surgery on the circulatory system: Secondary | ICD-10-CM | POA: Diagnosis not present

## 2018-11-16 DIAGNOSIS — F329 Major depressive disorder, single episode, unspecified: Secondary | ICD-10-CM | POA: Diagnosis not present

## 2018-11-16 DIAGNOSIS — Z888 Allergy status to other drugs, medicaments and biological substances status: Secondary | ICD-10-CM | POA: Diagnosis not present

## 2018-11-16 DIAGNOSIS — Z7982 Long term (current) use of aspirin: Secondary | ICD-10-CM | POA: Diagnosis not present

## 2018-11-16 DIAGNOSIS — Z87891 Personal history of nicotine dependence: Secondary | ICD-10-CM | POA: Diagnosis not present

## 2018-11-16 DIAGNOSIS — E669 Obesity, unspecified: Secondary | ICD-10-CM | POA: Diagnosis not present

## 2018-11-16 DIAGNOSIS — Z6841 Body Mass Index (BMI) 40.0 and over, adult: Secondary | ICD-10-CM | POA: Diagnosis not present

## 2018-11-16 DIAGNOSIS — Z79899 Other long term (current) drug therapy: Secondary | ICD-10-CM | POA: Diagnosis not present

## 2018-11-16 DIAGNOSIS — Z992 Dependence on renal dialysis: Secondary | ICD-10-CM | POA: Diagnosis not present

## 2018-11-16 DIAGNOSIS — N179 Acute kidney failure, unspecified: Secondary | ICD-10-CM | POA: Diagnosis not present

## 2018-11-16 DIAGNOSIS — I132 Hypertensive heart and chronic kidney disease with heart failure and with stage 5 chronic kidney disease, or end stage renal disease: Secondary | ICD-10-CM | POA: Diagnosis not present

## 2018-11-16 DIAGNOSIS — I252 Old myocardial infarction: Secondary | ICD-10-CM | POA: Diagnosis not present

## 2018-11-16 DIAGNOSIS — E785 Hyperlipidemia, unspecified: Secondary | ICD-10-CM | POA: Diagnosis not present

## 2018-11-16 DIAGNOSIS — Z01818 Encounter for other preprocedural examination: Secondary | ICD-10-CM | POA: Insufficient documentation

## 2018-11-16 HISTORY — DX: End stage renal disease: Z99.2

## 2018-11-16 HISTORY — DX: Cardiac murmur, unspecified: R01.1

## 2018-11-16 HISTORY — DX: End stage renal disease: N18.6

## 2018-11-16 LAB — CBC WITH DIFFERENTIAL/PLATELET
Abs Immature Granulocytes: 0.06 10*3/uL (ref 0.00–0.07)
Basophils Absolute: 0 10*3/uL (ref 0.0–0.1)
Basophils Relative: 0 %
Eosinophils Absolute: 0.1 10*3/uL (ref 0.0–0.5)
Eosinophils Relative: 2 %
HCT: 36.4 % (ref 36.0–46.0)
Hemoglobin: 11.5 g/dL — ABNORMAL LOW (ref 12.0–15.0)
Immature Granulocytes: 1 %
Lymphocytes Relative: 18 %
Lymphs Abs: 1.2 10*3/uL (ref 0.7–4.0)
MCH: 29.4 pg (ref 26.0–34.0)
MCHC: 31.6 g/dL (ref 30.0–36.0)
MCV: 93.1 fL (ref 80.0–100.0)
Monocytes Absolute: 0.6 10*3/uL (ref 0.1–1.0)
Monocytes Relative: 9 %
Neutro Abs: 4.7 10*3/uL (ref 1.7–7.7)
Neutrophils Relative %: 70 %
Platelets: 195 10*3/uL (ref 150–400)
RBC: 3.91 MIL/uL (ref 3.87–5.11)
RDW: 14.8 % (ref 11.5–15.5)
WBC: 6.8 10*3/uL (ref 4.0–10.5)
nRBC: 0 % (ref 0.0–0.2)

## 2018-11-16 LAB — BASIC METABOLIC PANEL
Anion gap: 14 (ref 5–15)
BUN: 67 mg/dL — ABNORMAL HIGH (ref 6–20)
CO2: 23 mmol/L (ref 22–32)
Calcium: 8.9 mg/dL (ref 8.9–10.3)
Chloride: 97 mmol/L — ABNORMAL LOW (ref 98–111)
Creatinine, Ser: 5.39 mg/dL — ABNORMAL HIGH (ref 0.44–1.00)
GFR calc Af Amer: 9 mL/min — ABNORMAL LOW (ref >60)
GFR calc non Af Amer: 8 mL/min — ABNORMAL LOW (ref >60)
Glucose, Bld: 378 mg/dL — ABNORMAL HIGH (ref 70–99)
Potassium: 3.9 mmol/L (ref 3.5–5.1)
Sodium: 134 mmol/L — ABNORMAL LOW (ref 135–145)

## 2018-11-16 LAB — TYPE AND SCREEN
ABO/RH(D): A POS
Antibody Screen: NEGATIVE

## 2018-11-16 NOTE — Patient Instructions (Signed)
Your procedure is scheduled on: 11-18-18 FRIDAY Report to Same Day Surgery 2nd floor medical mall [facility] Entrance-take elevator on left to 2nd floor.  Check in with surgery information desk.) To find out your arrival time please call [phone] between 1PM - 3PM on 11-17-18 THURSDAY  Remember: Instructions that are not followed completely may result in serious medical risk, up to and including death, or upon the discretion of your surgeon and anesthesiologist your surgery may need to be rescheduled.    _x___ 1. Do not eat food after midnight the night before your procedure. NO GUM OR CANDY AFTER MIDNIGHT.  You may drink WATER up to 2 hours before you are scheduled to arrive at the hospital for your procedure.  Do not drink WATER  within 2 hours of your scheduled arrival to the hospital.  Type 1 and type 2 diabetics should only drink water.   ____Ensure clear carbohydrate drink on the way to the hospital for bariatric patients  ____Ensure clear carbohydrate drink 3 hours before surgery for Dr Dwyane Luo patients if physician instructed.     __x__ 2. No Alcohol for 24 hours before or after surgery.   __x__3. No Smoking or e-cigarettes for 24 prior to surgery.  Do not use any chewable tobacco products for at least 6 hour prior to surgery   ____  4. Bring all medications with you on the day of surgery if instructed.    __x__ 5. Notify your doctor if there is any change in your medical condition     (cold, fever, infections).    x___6. On the morning of surgery brush your teeth with toothpaste and water.  You may rinse your mouth with mouth wash if you wish.  Do not swallow any toothpaste or mouthwash.   Do not wear jewelry, make-up, hairpins, clips or nail polish.  Do not wear lotions, powders, or perfumes. You may wear deodorant.  Do not shave 48 hours prior to surgery. Men may shave face and neck.  Do not bring valuables to the hospital.    [facility] is not responsible for any  belongings or valuables.               Contacts, dentures or bridgework may not be worn into surgery.  Leave your suitcase in the car. After surgery it may be brought to your room.  For patients admitted to the hospital, discharge time is determined by your treatment team.  _  Patients discharged the day of surgery will not be allowed to drive home.  You will need someone to drive you home and stay with you the night of your procedure.    Please read over the following fact sheets that you were given:   [facility] Preparing for Surgery  _x___ TAKE THE FOLLOWING MEDICATION THE MORNING OF SURGERY WITH A SMALL SIP OF WATER. These include:  1. ALLOPURINOL  2. LIPITOR (ATORVASTATIN)  3. COREG (CARVEDILOL)  4. HYDRALAZINE   5. CLARITIN (LORATADINE)  6. PRILOSEC (OMEPRAZOLE)  7. TAKE A PRILOSEC THE NIGHT BEFORE YOUR SURGERY  ____Fleets enema or Magnesium Citrate as directed.   _x___ Use CHG Soap or sage wipes as directed on instruction sheet   _X___ Use inhalers on the day of surgery and bring to hospital day of surgery-USE YOUR ALBUTEROL [facility]  ____ Stop Metformin and Janumet 2 days prior to surgery.    _X___ Take 1/2 of usual insulin dose the night before  surgery and none on the morning  Surgery-TAKE 30 UNITS OF LANTUS THE NIGHT BEFORE SURGERY AND NO INSULIN THE MORNING OF SURGERY  _x___ Follow recommendations from Cardiologist, Pulmonologist or PCP regarding stopping Aspirin, Coumadin, Plavix ,Eliquis, Effient, or Pradaxa, and Pletal-CONTINUE 81 MG ASA -DO NOT TAKE AM OF SURGERY  X____Stop Anti-inflammatories such as Advil, Aleve, Ibuprofen, Motrin, Naproxen, Naprosyn, Goodies powders or aspirin products NOW-OK to take Tylenol   ____ Stop supplements until after surgery.    ____ Bring C-Pap to the hospital.

## 2018-11-16 NOTE — Pre-Procedure Instructions (Signed)
Heather Serge, NP  Nurse Practitioner  Cardiology  Progress Notes  Addendum  Encounter Date:  07/28/2018          Expand All Collapse All    Show:Clear all _0 Manual_1 Template_2 Copied  Added by: _3 Heather Serge, NP  _4 Hover for details    Cardiology Office Note   Date:  07/28/2018   ID:  [name], DOB [birth year], MRN [id number]  PCP:  [name], PA-C       Cardiologist:  Dr. Burt Knack        Chief Complaint  Patient presents with   Pre-op Exam      History of Present Illness: [name] is a 63 y.o. female who presents for pre-op eval for AV graft.  By Dr. Delana Meyer.   DM, HTN, hyperlipidemia, CKD, HEP C, OSA, CAD, chronic diastolic CHF and mild AS  Patient's cardiac history dates back to March 2018 when she presented with a non-ST elevation myocardial infarction. Echocardiogram showed ejection fraction 45-50% with septal and apical hypokinesis. She underwent cardiac catheterization and was found to have a 95% proximal LAD and she had PCI with a Synergy drug-eluting stent. Last echocardiogram August 2018 showed normal LV function, grade 1 diastolic dysfunction, mild aortic stenosis with mean gradient 13 mmHg, mild aortic insufficiency and mild left atrial enlargement. She had repeat cardiac catheterization October 2018 because of recurring chest pain. She was found to have a widely patent mid LAD stent. There was mild to moderate disease in her circumflex, obtuse marginal and diagonal. Left ventricular filling pressure was normal. Recommended medical therapy.    Seen in ER 07/15/17 for SOB and weight gain. Given IV lasix and increased lasix for few days. Weight was 323lb.   Here today for follow up.She has lost 21lb since ER visit. Still has SOB with exertion,orthopnea and lower extremity edema. She denies chest pain, palpitation, dizziness, melena or blood in his stool or urine. Runout of some of her blood pressure medication week  ago. Admitted 10/03/17 due to acute on chronic HF due to noncompliance. Cardiology consult obtained. Initially needed IV lasix and then transitioned to oral diuretics. Nephrology also saw patient and decided she didn't need dialysis at this time. Discharged after 8 days. Admitted 09/14/17 due to acute on chronic HF. Initially needed lasix drip and then transitioned to oral lasix. Cardiology and pulmonology consults were obtained. Had acute kidney injury which did improve. Discussed with nephrology. Discharged after 6 days  Hospitalization 05/2018 at [facility] on dialysis.  Needs  Pre-op eval, for AV graft.  Lt arm, has subclavian cath currently on Rt.   Now since stopping imdur she is DOE.  No chest pain but dyspneic.  She can lie flat to sleep without dyspnea.  Her last EKG was without changes.   Her diabetes is improved with Hgb A1C down from 11 to 8.3 now.  She has dialysis and is very tired afterward.  Her cath in subclavian is uncomfortable. Her stent has been in almost 2 years.  discussed with Dr. Burt Knack and ok to stop Brilinta at this time. Continue ASA 81 mg daily.          Past Medical History:  Diagnosis Date   Anemia    Aortic stenosis    Echo 8/18: mean 13, peak 28, LVOT/AV mean velocity 0.51   Arthritis    Asthma    As a child    Bronchitis    CAD (coronary artery disease)    a. 09/2016:  50% Ost 1st Mrg stenosis, 50% 2nd Mrg stenosis, 20% Mid-Cx, 95% Prox LAD, 40% mid-LAD, and 10% dist-LAD stenosis. Staged PCI with DES to Prox-LAD.    Chronic combined systolic and diastolic CHF (congestive heart failure) ([place]) 2011   echo 2/18: EF 55-60, normal wall motion, grade 2 diastolic dysfunction, trivial AI // echo 3/18: Septal and apical HK, EF 45-50, normal wall motion, trivial AI, mild LAE, PASP 38 // echo 8/18: EF 60-65, normal wall motion, grade 1 diastolic dysfunction, calcified aortic valve leaflets, mild aortic stenosis (mean 13, peak 28, LVOT/AV mean velocity 0.51),  mild AI, moderate MAC, mild LAE, trivial TR    Chronic kidney disease    STAGE 4   Depression    Diabetes mellitus Dx 1989   GERD (gastroesophageal reflux disease)    Gout    Hepatitis C Dx 2013   Hypertension Dx 1989   Myocardial infarction ([place]) 07/2015   Obesity    Obstructive sleep apnea    Pancreatitis 2013   Pneumonia    Refusal of blood transfusions as patient is Jehovah's Witness    Tendinitis    Tremors of nervous system    LEFT HAND   Ulcer 2010         Past Surgical History:  Procedure Laterality Date   APPLICATION OF WOUND VAC Left 08/14/2017   Procedure: APPLICATION OF WOUND VAC Exchange;  Surgeon: Robert Bellow, MD;  Location: [facility];  Service: General;  Laterality: Left;   CHOLECYSTECTOMY     COLONOSCOPY WITH PROPOFOL N/A 02/03/2018   Procedure: COLONOSCOPY WITH PROPOFOL;  Surgeon: Lin Landsman, MD;  Location: [facility];  Service: Gastroenterology;  Laterality: N/A;   CORONARY ANGIOPLASTY  07/2015   STENT   CORONARY STENT INTERVENTION N/A 09/18/2016   Procedure: Coronary Stent Intervention;  Surgeon: Troy Sine, MD;  Location: [place] [facility];  Service: Cardiovascular;  Laterality: N/A;   DIALYSIS/PERMA CATHETER INSERTION N/A 05/10/2018   Procedure: DIALYSIS/PERMA CATHETER INSERTION;  Surgeon: Katha Cabal, MD;  Location: [place] [facility];  Service: Cardiovascular;  Laterality: N/A;   DRESSING CHANGE UNDER ANESTHESIA Left 08/15/2017   Procedure: exploration of wound for bleeding;  Surgeon: Robert Bellow, MD;  Location: [facility];  Service: General;  Laterality: Left;   ESOPHAGOGASTRODUODENOSCOPY (EGD) WITH PROPOFOL N/A 02/03/2018   Procedure: ESOPHAGOGASTRODUODENOSCOPY (EGD) WITH PROPOFOL;  Surgeon: Lin Landsman, MD;  Location: [facility];  Service: Gastroenterology;  Laterality: N/A;   EYE SURGERY     INCISION AND DRAINAGE ABSCESS Left 08/12/2017   Procedure:  INCISION AND DRAINAGE ABSCESS;  Surgeon: Robert Bellow, MD;  Location: [facility];  Service: General;  Laterality: Left;   KNEE ARTHROSCOPY     LEFT HEART CATH N/A 09/18/2016   Procedure: Left Heart Cath;  Surgeon: Troy Sine, MD;  Location: [place] [facility];  Service: Cardiovascular;  Laterality: N/A;   LEFT HEART CATH AND CORONARY ANGIOGRAPHY N/A 09/16/2016   Procedure: Left Heart Cath and Coronary Angiography;  Surgeon: Burnell Blanks, MD;  Location: [place] [facility];  Service: Cardiovascular;  Laterality: N/A;   LEFT HEART CATH AND CORONARY ANGIOGRAPHY N/A 04/29/2017   Procedure: LEFT HEART CATH AND CORONARY ANGIOGRAPHY;  Surgeon: Nelva Bush, MD;  Location: [place] [facility];  Service: Cardiovascular;  Laterality: N/A;   LOWER EXTREMITY ANGIOGRAPHY Right 03/08/2018   Procedure: LOWER EXTREMITY ANGIOGRAPHY;  Surgeon: Katha Cabal, MD;  Location: [place] [facility];  Service: Cardiovascular;  Laterality:  Right;   TUBAL LIGATION     TUBAL LIGATION             Current Outpatient Medications  Medication Sig Dispense Refill   ACCU-CHEK SOFTCLIX LANCETS lancets USE AS DIRECTED TO CHECK BLOOD SUGAR THREE TIMES DAILY AND AT BEDTIME 400 each 1   albuterol (PROAIR HFA) 108 (90 Base) MCG/ACT inhaler INHALE 2 PUFFS BY MOUTH EVERY 4 HOURS AS NEEDED FOR SHORTNESS OF BREATH 8.5 g 5   allopurinol (ZYLOPRIM) 100 MG tablet TAKE 1 TABLET BY MOUTH ONCE DAILY. 90 tablet 1   aspirin EC 81 MG EC tablet Take 1 tablet (81 mg total) by mouth daily.     atorvastatin (LIPITOR) 80 MG tablet Take 80 mg by mouth daily.     carvedilol (COREG) 25 MG tablet Take 25 mg by mouth 2 (two) times daily with a meal.     docusate sodium (COLACE) 100 MG capsule Take 1 capsule (100 mg total) by mouth 2 (two) times daily as needed for mild constipation. 30 capsule 0   fluticasone (FLONASE) 50 MCG/ACT nasal spray Place 2 sprays into both nostrils daily as needed for  allergies or rhinitis. 48 g 1   Fluticasone-Salmeterol (ADVAIR DISKUS) 250-50 MCG/DOSE AEPB Inhale 1 puff into the lungs 2 (two) times daily. 180 each 3   glucose blood (ACCU-CHEK GUIDE) test strip To check blood sugar before meals and bedtime 400 each 12   hydrALAZINE (APRESOLINE) 100 MG tablet TAKE (1) TABLET BY MOUTH THREE TIMES DAILY 90 tablet 5   HYDROcodone-acetaminophen (NORCO/VICODIN) 5-325 MG tablet Take 1 tablet by mouth every 8 (eight) hours as needed for moderate pain. 15 tablet 0   hydrOXYzine (ATARAX/VISTARIL) 25 MG tablet TAKE (1) TABLET BY MOUTH EVERY SIX HOURS AS NEEDED FOR ITCHING. 120 tablet 0   insulin aspart (NOVOLOG FLEXPEN) 100 UNIT/ML FlexPen Inject 25 Units into the skin 3 (three) times daily with meals. 15 mL 11   Insulin Pen Needle (PEN NEEDLES) 30G X 8 MM MISC 1 each by Does not apply route 3 (three) times daily before meals. AC for novolog injections 100 each 11   isosorbide mononitrate (IMDUR) 30 MG 24 hr tablet TAKE (3) TABLETS BY MOUTH ONCE DAILY. 90 tablet 1   Lancet Devices (ACCU-CHEK SOFTCLIX) lancets Use as instructed daily. 1 each 5   LANTUS SOLOSTAR 100 UNIT/ML Solostar Pen INJECT 60 UNITS S.Q. TWICE DAILY. (Patient taking differently: Inject 65 Units into the skin 2 (two) times daily. ) 24 mL 11   liraglutide (VICTOZA) 18 MG/3ML SOPN Start with 0.65m daily and increase by 0.684mper week until max dose of 1.8 mg dose achieved. 1 pen 6   loratadine (CLARITIN) 10 MG tablet TAKE 1 TABLET BY MOUTH ONCE DAILY. 90 tablet 1   mometasone (ELOCON) 0.1 % cream Apply 1 application topically daily. 45 g 0   montelukast (SINGULAIR) 10 MG tablet TAKE ONE TABLET BY MOUTH AT BEDTIME. 90 tablet 1   mupirocin ointment (BACTROBAN) 2 % APPLY INTO THE NOSTRILS TWICE DAILY AS DIRECTED. 22 g 0   nitroGLYCERIN (NITROSTAT) 0.4 MG SL tablet Place 1 tablet (0.4 mg total) under the tongue every 5 (five) minutes x 3 doses as needed for chest pain. 25 tablet 12   omeprazole  (PRILOSEC) 20 MG capsule Take 1 capsule (20 mg total) by mouth daily. 90 capsule 3   pregabalin (LYRICA) 75 MG capsule Take 1 capsule (75 mg total) by mouth 2 (two) times daily. 180 capsule 1  SENNA PLUS 8.6-50 MG tablet TAKE ONE TABLET BY MOUTH AT BEDTIME. 90 tablet 1   torsemide (DEMADEX) 20 MG tablet TAKE (2) TABLETS BY MOUTH TWICE DAILY. 120 tablet 5   triamcinolone cream (KENALOG) 0.1 % APPLY TO AFFECTED AREA TWICE DAILY 80 g 0   valACYclovir (VALTREX) 500 MG tablet TAKE (1) TABLET BY MOUTH EVERY OTHER DAY. 45 tablet 1   Vitamin D, Ergocalciferol, (DRISDOL) 50000 units CAPS capsule TAKE 1 CAPSULE BY MOUTH ONCE A MONTH 3 capsule 3   No current facility-administered medications for this visit.     Allergies:   Shellfish allergy; Diazepam; and Morphine and related    Social History:  The patient  reports that she quit smoking about 37 years ago. Her smoking use included cigarettes. She has never used smokeless tobacco. She reports current alcohol use. She reports previous drug use. Drug: "Crack" cocaine.   Family History:  The patient's family history includes Colon cancer in her mother; Diabetes in her paternal grandmother; Heart attack in her maternal grandmother and another family member; Hypertension in her brother and sister.    ROS:  General:no colds or fevers, + weight loss since beginning dialysis Skin:no rashes or ulcers HEENT:no blurred vision, no congestion CV:see HPI PUL:see HPI GI:no diarrhea constipation or melena, no indigestion GU:no hematuria, no dysuria MS:no joint pain, no claudication Neuro:no syncope, no lightheadedness Endo:+ diabetes, no thyroid disease     Wt Readings from Last 3 Encounters:  07/28/18 289 lb 1.9 oz (131.1 kg)  06/01/18 295 lb 3.2 oz (133.9 kg)  06/01/18 293 lb 6.4 oz (133.1 kg)     PHYSICAL EXAM: VS:  BP 138/62    Pulse 87    Ht _0  (1.727 m)    Wt 289 lb 1.9 oz (131.1 kg)    SpO2 96%    BMI 43.96 kg/m  , BMI Body  mass index is 43.96 kg/m. General:Pleasant affect, NAD Skin:Warm and dry, brisk capillary refill HEENT:normocephalic, sclera clear, mucus membranes moist Neck:supple, no JVD, no bruits  Heart:S1S2 RRR with 2/6 systolic murmur, no gallup, rub or click Lungs:clear without rales, rhonchi, or wheezes DDU:KGUR, non tender, + BS, do not palpate liver spleen or masses Ext:no lower ext edema, 2+ pedal pulses, 2+ radial pulses Neuro:alert and oriented X 3, MAE, follows commands, + facial symmetry    EKG:  EKG is not ordered today. The ekg from Nov is reviewed and no changes.    Recent Labs: 08/05/2017: NT-Pro BNP 401 10/03/2017: B Natriuretic Peptide 147.0 12/25/2017: TSH 1.577 05/09/2018: ALT 19 05/11/2018: Magnesium 2.1 05/13/2018: BUN 75; Creatinine, Ser 2.28; Hemoglobin 8.1; Platelets 201; Potassium 3.6; Sodium 135    Lipid Panel Labs(Brief)          Component Value Date/Time   CHOL 122 09/13/2016 1245   TRIG 168 (H) 09/13/2016 1245   HDL 36 (L) 09/13/2016 1245   CHOLHDL 3.4 09/13/2016 1245   VLDL 34 09/13/2016 1245   LDLCALC 52 09/13/2016 1245         Other studies Reviewed: Additional studies/ records that were reviewed today include:  Last cath 04/2017  Conclusions: 1. Widely patent mid LAD stent. 2. No significant change in mild to moderate LCx, OM, and diagonal disease compared with 09/2016. 3. Normal left ventricular filling pressure.  Recommendations: 1. Continue medical therapy and secondary prevention. I will restart isosorbide mononitrate at 90 mg daily with hope of weaning off NTG infusion later today. Gentle hydration given CKD and normal LVEDP. Hold  furosemide today and reassess volume status and renal function tomorrow before restarting diuretic therapy.   Echo 09/15/17 Study Conclusions  - Left ventricle: The cavity size was at the upper limits of normal. Wall thickness was increased in a pattern of mild LVH. Systolic function was  vigorous. The estimated ejection fraction was in the range of 65% to 70%. Wall motion was normal; there were no regional wall motion abnormalities. Doppler parameters are consistent with abnormal left ventricular relaxation (grade 1 diastolic dysfunction). Doppler parameters are consistent with high ventricular filling pressure. - Aortic valve: Transvalvular velocity was increased. There was moderate stenosis. There was trivial regurgitation. Peak velocity (S): 308 cm/s. Mean gradient (S): 20 mm Hg. Valve area (VTI): 1.34 cm^2. Valve area (Vmax): 1.11 cm^2. - Ascending aorta: The ascending aorta was mildly dilated. - Mitral valve: Calcified annulus. Mildly thickened leaflets . - Left atrium: The atrium was mildly dilated. - Right ventricle: The cavity size was normal. Systolic function was normal. - Inferior vena cava: The vessel was dilated. The respirophasic diameter changes were blunted (<50%), consistent with elevated central venous pressure. - Pericardium, extracardiac: A trivial pericardial effusion was identified posterior to the heart.  ASSESSMENT AND PLAN:  1.  Pre-op eval.  Pt with CAD including MI in 09/2016 undergoing AV graft insertion for HD.  She has subclavian cath now, causing her pain at time.  Her stent to LAD was placed 09/2016 and at this point can stop Brilinta.  Continue asa.  She has no chest pain and last cath 10/20-18 with patent stent and non obstructive CAD.  She does have DOE this has increased with dialysis, will check echo to eval her moderate AS and EF.  If stable she would be cleared to have done.       2.  CAD see above no chest pain  3.   Chronic diastolic HF though improved with dialysis, still on diuretic followed by renal.   4.  HLD continue statin   5.  HTN controlled.    6.  ESRD on HD  Follow up in 6 months with Dr. Burt Knack.   Echo 07/29/18  Study Conclusions  - Left ventricle: The cavity size was normal.  Wall thickness was increased in a pattern of moderate LVH. Systolic function was normal. The estimated ejection fraction was in the range of 60% to 65%. Doppler parameters are consistent with abnormal left ventricular relaxation (grade 1 diastolic dysfunction). - Aortic valve: There was mild to moderate stenosis. There was trivial regurgitation. Valve area (VTI): 2.07 cm^2. Valve area (Vmax): 1.69 cm^2. Valve area (Vmean): 1.67 cm^2. - Mitral valve: Calcified annulus. - Left atrium: The atrium was mildly dilated. - Atrial septum: No defect or patent foramen ovale was identified.   Current medicines are reviewed with the patient today.  The patient Has no concerns regarding medicines.  The following changes have been made:  See above Labs/ tests ordered today include:see above  Disposition:   FU:  see above  Signed, Cecilie Kicks, NP  07/28/2018 5:46 PM    [facility] [address] [place], [place] Phone: [phone]; Fax: [phone]  Echo stable discussed with  Dr. Burt Knack and acceptable risk for procedure.     Primary Cardiologist: Sherren Mocha, MD  Chart reviewed as part of pre-operative protocol coverage. Given past medical history and time since last visit, based on ACC/AHA guidelines, Payten M Delcid would  be at acceptable risk for the planned procedure without further cardiovascular testing.   I will route this recommendation to the requesting party via Epic fax function and remove from pre-op pool.  Please call with questions.  Cecilie Kicks, NP 08/01/2018, 8:15 AM         Electronically signed by Heather Serge, NP at 07/28/2018 5:58 PM Electronically signed by Heather Serge, NP at 08/01/2018 8:15 AM     Office Visit on 07/28/2018       Revision History       Detailed Report

## 2018-11-17 ENCOUNTER — Encounter: Payer: Self-pay | Admitting: *Deleted

## 2018-11-17 DIAGNOSIS — S41102A Unspecified open wound of left upper arm, initial encounter: Secondary | ICD-10-CM | POA: Diagnosis not present

## 2018-11-17 DIAGNOSIS — H25812 Combined forms of age-related cataract, left eye: Secondary | ICD-10-CM | POA: Diagnosis not present

## 2018-11-17 DIAGNOSIS — D509 Iron deficiency anemia, unspecified: Secondary | ICD-10-CM | POA: Diagnosis not present

## 2018-11-17 DIAGNOSIS — N186 End stage renal disease: Secondary | ICD-10-CM | POA: Diagnosis not present

## 2018-11-17 DIAGNOSIS — N2581 Secondary hyperparathyroidism of renal origin: Secondary | ICD-10-CM | POA: Diagnosis not present

## 2018-11-17 DIAGNOSIS — I10 Essential (primary) hypertension: Secondary | ICD-10-CM | POA: Diagnosis not present

## 2018-11-17 DIAGNOSIS — D631 Anemia in chronic kidney disease: Secondary | ICD-10-CM | POA: Diagnosis not present

## 2018-11-17 DIAGNOSIS — E118 Type 2 diabetes mellitus with unspecified complications: Secondary | ICD-10-CM | POA: Diagnosis not present

## 2018-11-17 HISTORY — PX: EYE SURGERY: SHX253

## 2018-11-17 LAB — PROTIME-INR

## 2018-11-17 LAB — APTT

## 2018-11-17 MED ORDER — CEFAZOLIN SODIUM-DEXTROSE 1-4 GM/50ML-% IV SOLN
1.0000 g | INTRAVENOUS | Status: AC
  Administered 2018-11-18: 2 g via INTRAVENOUS

## 2018-11-17 NOTE — Pre-Procedure Instructions (Signed)
Messaged dr Andree Elk about glucose of 387. He asked if pt could adjust using her ss insulin. Pt is not on a ss but I informed him that pt would follow insulin instructions per PCP

## 2018-11-18 ENCOUNTER — Ambulatory Visit: Payer: Medicare Other | Admitting: Anesthesiology

## 2018-11-18 ENCOUNTER — Ambulatory Visit
Admission: RE | Admit: 2018-11-18 | Discharge: 2018-11-18 | Disposition: A | Discharge status: Home / Self Care | Payer: Medicare Other | Attending: Vascular Surgery | Admitting: Vascular Surgery

## 2018-11-18 ENCOUNTER — Encounter
Admission: RE | Disposition: A | Discharge status: Home / Self Care | Payer: Self-pay | Source: Home / Self Care | Attending: Vascular Surgery

## 2018-11-18 ENCOUNTER — Other Ambulatory Visit: Payer: Self-pay

## 2018-11-18 ENCOUNTER — Other Ambulatory Visit: Payer: Self-pay | Admitting: Physician Assistant

## 2018-11-18 ENCOUNTER — Encounter: Payer: Self-pay | Admitting: *Deleted

## 2018-11-18 DIAGNOSIS — E1100 Type 2 diabetes mellitus with hyperosmolarity without nonketotic hyperglycemic-hyperosmolar coma (NKHHC): Secondary | ICD-10-CM | POA: Insufficient documentation

## 2018-11-18 DIAGNOSIS — G4733 Obstructive sleep apnea (adult) (pediatric): Secondary | ICD-10-CM | POA: Diagnosis not present

## 2018-11-18 DIAGNOSIS — I5042 Chronic combined systolic (congestive) and diastolic (congestive) heart failure: Secondary | ICD-10-CM | POA: Insufficient documentation

## 2018-11-18 DIAGNOSIS — Z992 Dependence on renal dialysis: Secondary | ICD-10-CM | POA: Insufficient documentation

## 2018-11-18 DIAGNOSIS — J9601 Acute respiratory failure with hypoxia: Secondary | ICD-10-CM

## 2018-11-18 DIAGNOSIS — Z888 Allergy status to other drugs, medicaments and biological substances status: Secondary | ICD-10-CM | POA: Insufficient documentation

## 2018-11-18 DIAGNOSIS — N179 Acute kidney failure, unspecified: Secondary | ICD-10-CM | POA: Insufficient documentation

## 2018-11-18 DIAGNOSIS — I132 Hypertensive heart and chronic kidney disease with heart failure and with stage 5 chronic kidney disease, or end stage renal disease: Secondary | ICD-10-CM | POA: Diagnosis not present

## 2018-11-18 DIAGNOSIS — J449 Chronic obstructive pulmonary disease, unspecified: Secondary | ICD-10-CM | POA: Insufficient documentation

## 2018-11-18 DIAGNOSIS — M109 Gout, unspecified: Secondary | ICD-10-CM | POA: Insufficient documentation

## 2018-11-18 DIAGNOSIS — Z7982 Long term (current) use of aspirin: Secondary | ICD-10-CM | POA: Insufficient documentation

## 2018-11-18 DIAGNOSIS — E1140 Type 2 diabetes mellitus with diabetic neuropathy, unspecified: Secondary | ICD-10-CM | POA: Insufficient documentation

## 2018-11-18 DIAGNOSIS — Z9851 Tubal ligation status: Secondary | ICD-10-CM | POA: Insufficient documentation

## 2018-11-18 DIAGNOSIS — Z79899 Other long term (current) drug therapy: Secondary | ICD-10-CM | POA: Insufficient documentation

## 2018-11-18 DIAGNOSIS — J301 Allergic rhinitis due to pollen: Secondary | ICD-10-CM

## 2018-11-18 DIAGNOSIS — N186 End stage renal disease: Secondary | ICD-10-CM | POA: Diagnosis not present

## 2018-11-18 DIAGNOSIS — Z955 Presence of coronary angioplasty implant and graft: Secondary | ICD-10-CM | POA: Insufficient documentation

## 2018-11-18 DIAGNOSIS — E669 Obesity, unspecified: Secondary | ICD-10-CM | POA: Insufficient documentation

## 2018-11-18 DIAGNOSIS — E1165 Type 2 diabetes mellitus with hyperglycemia: Secondary | ICD-10-CM

## 2018-11-18 DIAGNOSIS — I252 Old myocardial infarction: Secondary | ICD-10-CM | POA: Insufficient documentation

## 2018-11-18 DIAGNOSIS — B1920 Unspecified viral hepatitis C without hepatic coma: Secondary | ICD-10-CM | POA: Insufficient documentation

## 2018-11-18 DIAGNOSIS — Z794 Long term (current) use of insulin: Secondary | ICD-10-CM | POA: Insufficient documentation

## 2018-11-18 DIAGNOSIS — Z6841 Body Mass Index (BMI) 40.0 and over, adult: Secondary | ICD-10-CM | POA: Insufficient documentation

## 2018-11-18 DIAGNOSIS — I2510 Atherosclerotic heart disease of native coronary artery without angina pectoris: Secondary | ICD-10-CM | POA: Insufficient documentation

## 2018-11-18 DIAGNOSIS — E1122 Type 2 diabetes mellitus with diabetic chronic kidney disease: Secondary | ICD-10-CM | POA: Diagnosis not present

## 2018-11-18 DIAGNOSIS — A6000 Herpesviral infection of urogenital system, unspecified: Secondary | ICD-10-CM

## 2018-11-18 DIAGNOSIS — IMO0002 Reserved for concepts with insufficient information to code with codable children: Secondary | ICD-10-CM

## 2018-11-18 DIAGNOSIS — K219 Gastro-esophageal reflux disease without esophagitis: Secondary | ICD-10-CM | POA: Insufficient documentation

## 2018-11-18 DIAGNOSIS — Z87891 Personal history of nicotine dependence: Secondary | ICD-10-CM | POA: Insufficient documentation

## 2018-11-18 DIAGNOSIS — Z885 Allergy status to narcotic agent status: Secondary | ICD-10-CM | POA: Insufficient documentation

## 2018-11-18 DIAGNOSIS — E785 Hyperlipidemia, unspecified: Secondary | ICD-10-CM | POA: Insufficient documentation

## 2018-11-18 HISTORY — PX: BASCILIC VEIN TRANSPOSITION: SHX5742

## 2018-11-18 LAB — URINE DRUG SCREEN, QUALITATIVE (ARMC ONLY)
Amphetamines, Ur Screen: NOT DETECTED
Barbiturates, Ur Screen: NOT DETECTED
Benzodiazepine, Ur Scrn: POSITIVE — AB
Cannabinoid 50 Ng, Ur ~~LOC~~: NOT DETECTED
Cocaine Metabolite,Ur ~~LOC~~: NOT DETECTED
MDMA (Ecstasy)Ur Screen: NOT DETECTED
Methadone Scn, Ur: NOT DETECTED
Opiate, Ur Screen: NOT DETECTED
Phencyclidine (PCP) Ur S: NOT DETECTED
Tricyclic, Ur Screen: NOT DETECTED

## 2018-11-18 LAB — GLUCOSE, CAPILLARY
Glucose-Capillary: 218 mg/dL — ABNORMAL HIGH (ref 70–99)
Glucose-Capillary: 268 mg/dL — ABNORMAL HIGH (ref 70–99)
Glucose-Capillary: 312 mg/dL — ABNORMAL HIGH (ref 70–99)
Glucose-Capillary: 262 mg/dL — ABNORMAL HIGH (ref 70–99)

## 2018-11-18 LAB — APTT: aPTT: 30 seconds (ref 24–36)

## 2018-11-18 LAB — PROTIME-INR
INR: 0.9 (ref 0.8–1.2)
Prothrombin Time: 12.2 seconds (ref 11.4–15.2)

## 2018-11-18 SURGERY — TRANSPOSITION, VEIN, BASILIC
Anesthesia: General | Laterality: Left

## 2018-11-18 MED ORDER — ACETAMINOPHEN 10 MG/ML IV SOLN
INTRAVENOUS | Status: AC
  Filled 2018-11-18: qty 100

## 2018-11-18 MED ORDER — ROCURONIUM BROMIDE 50 MG/5ML IV SOLN
INTRAVENOUS | Status: AC
  Filled 2018-11-18: qty 1

## 2018-11-18 MED ORDER — HEPARIN SODIUM (PORCINE) 5000 UNIT/ML IJ SOLN
INTRAMUSCULAR | Status: AC
  Filled 2018-11-18: qty 1

## 2018-11-18 MED ORDER — OXYCODONE-ACETAMINOPHEN 5-325 MG PO TABS: 1.0000 | ORAL_TABLET | Freq: Four times a day (QID) | ORAL | 0 refills | Status: DC | PRN

## 2018-11-18 MED ORDER — SODIUM CHLORIDE 0.9 % IV SOLN
INTRAVENOUS | Status: DC | PRN
  Administered 2018-11-18: 20 ug/min via INTRAVENOUS

## 2018-11-18 MED ORDER — SUCCINYLCHOLINE CHLORIDE 20 MG/ML IJ SOLN
INTRAMUSCULAR | Status: DC | PRN
  Administered 2018-11-18: 140 mg via INTRAVENOUS

## 2018-11-18 MED ORDER — KETAMINE HCL 50 MG/ML IJ SOLN
INTRAMUSCULAR | Status: AC
  Filled 2018-11-18: qty 10

## 2018-11-18 MED ORDER — MIDAZOLAM HCL 2 MG/2ML IJ SOLN
INTRAMUSCULAR | Status: AC
  Filled 2018-11-18: qty 2

## 2018-11-18 MED ORDER — LIDOCAINE HCL (PF) 2 % IJ SOLN
INTRAMUSCULAR | Status: AC
  Filled 2018-11-18: qty 10

## 2018-11-18 MED ORDER — EPHEDRINE SULFATE 50 MG/ML IJ SOLN
INTRAMUSCULAR | Status: AC
  Filled 2018-11-18: qty 1

## 2018-11-18 MED ORDER — FENTANYL CITRATE (PF) 100 MCG/2ML IJ SOLN
INTRAMUSCULAR | Status: AC
  Filled 2018-11-18: qty 2

## 2018-11-18 MED ORDER — PHENYLEPHRINE HCL (PRESSORS) 10 MG/ML IV SOLN
INTRAVENOUS | Status: AC
  Filled 2018-11-18: qty 1

## 2018-11-18 MED ORDER — BUPIVACAINE HCL (PF) 0.5 % IJ SOLN
INTRAMUSCULAR | Status: AC
  Filled 2018-11-18: qty 30

## 2018-11-18 MED ORDER — LIDOCAINE HCL (CARDIAC) PF 100 MG/5ML IV SOSY
PREFILLED_SYRINGE | INTRAVENOUS | Status: DC | PRN
  Administered 2018-11-18: 40 mg via INTRAVENOUS

## 2018-11-18 MED ORDER — CHLORHEXIDINE GLUCONATE CLOTH 2 % EX PADS: 6.0000 | MEDICATED_PAD | Freq: Once | CUTANEOUS | Status: DC

## 2018-11-18 MED ORDER — INSULIN ASPART 100 UNIT/ML ~~LOC~~ SOLN
SUBCUTANEOUS | Status: AC
  Administered 2018-11-18: 12:00:00 5 [IU] via SUBCUTANEOUS
  Filled 2018-11-18: qty 1

## 2018-11-18 MED ORDER — HYDROMORPHONE HCL 1 MG/ML IJ SOLN: 0.2500 mg | INTRAMUSCULAR | Status: DC | PRN

## 2018-11-18 MED ORDER — FENTANYL CITRATE (PF) 100 MCG/2ML IJ SOLN
INTRAMUSCULAR | Status: DC | PRN
  Administered 2018-11-18: 50 ug via INTRAVENOUS

## 2018-11-18 MED ORDER — BUPIVACAINE HCL (PF) 0.5 % IJ SOLN
INTRAMUSCULAR | Status: DC | PRN
  Administered 2018-11-18: 30 mL

## 2018-11-18 MED ORDER — SODIUM CHLORIDE 0.9 % IV SOLN
INTRAVENOUS | Status: DC | PRN
  Administered 2018-11-18: 500 mL via INTRAMUSCULAR

## 2018-11-18 MED ORDER — BUPIVACAINE LIPOSOME 1.3 % IJ SUSP
INTRAMUSCULAR | Status: DC | PRN
  Administered 2018-11-18: 20 mL

## 2018-11-18 MED ORDER — PHENYLEPHRINE HCL (PRESSORS) 10 MG/ML IV SOLN
INTRAVENOUS | Status: DC | PRN
  Administered 2018-11-18 (×3): 100 ug via INTRAVENOUS

## 2018-11-18 MED ORDER — PROPOFOL 10 MG/ML IV BOLUS
INTRAVENOUS | Status: AC
  Filled 2018-11-18: qty 20

## 2018-11-18 MED ORDER — BUPIVACAINE LIPOSOME 1.3 % IJ SUSP
INTRAMUSCULAR | Status: AC
  Filled 2018-11-18: qty 20

## 2018-11-18 MED ORDER — PROPOFOL 10 MG/ML IV BOLUS
INTRAVENOUS | Status: DC | PRN
  Administered 2018-11-18: 120 mg via INTRAVENOUS

## 2018-11-18 MED ORDER — CEFAZOLIN SODIUM-DEXTROSE 1-4 GM/50ML-% IV SOLN
INTRAVENOUS | Status: AC
  Filled 2018-11-18: qty 50

## 2018-11-18 MED ORDER — FENTANYL CITRATE (PF) 100 MCG/2ML IJ SOLN: 12.5000 ug | Freq: Once | INTRAMUSCULAR | Status: DC | PRN

## 2018-11-18 MED ORDER — ROCURONIUM BROMIDE 100 MG/10ML IV SOLN
INTRAVENOUS | Status: DC | PRN
  Administered 2018-11-18 (×3): 10 mg via INTRAVENOUS
  Administered 2018-11-18: 30 mg via INTRAVENOUS

## 2018-11-18 MED ORDER — SODIUM CHLORIDE 0.9 % IV SOLN
INTRAVENOUS | Status: DC
  Administered 2018-11-18: 08:00:00 via INTRAVENOUS

## 2018-11-18 MED ORDER — SEVOFLURANE IN SOLN
RESPIRATORY_TRACT | Status: AC
  Filled 2018-11-18: qty 250

## 2018-11-18 MED ORDER — INSULIN ASPART 100 UNIT/ML ~~LOC~~ SOLN
5.0000 [IU] | Freq: Once | SUBCUTANEOUS | Status: AC
  Administered 2018-11-18: 5 [IU] via SUBCUTANEOUS

## 2018-11-18 MED ORDER — ONDANSETRON HCL 4 MG/2ML IJ SOLN: 4.0000 mg | Freq: Four times a day (QID) | INTRAMUSCULAR | Status: DC | PRN

## 2018-11-18 MED ORDER — SUGAMMADEX SODIUM 200 MG/2ML IV SOLN
INTRAVENOUS | Status: DC | PRN
  Administered 2018-11-18: 200 mg via INTRAVENOUS
  Administered 2018-11-18: 60 mg via INTRAVENOUS
  Administered 2018-11-18: 200 mg via INTRAVENOUS
  Administered 2018-11-18: 60 mg via INTRAVENOUS

## 2018-11-18 SURGICAL SUPPLY — 68 items
ADH SKN CLS APL DERMABOND .7 (GAUZE/BANDAGES/DRESSINGS) ×1
APL PRP STRL LF DISP 70% ISPRP (MISCELLANEOUS) ×2
APPLIER CLIP 11 MED OPEN (CLIP)
APPLIER CLIP 9.375 SM OPEN (CLIP) ×2
APR CLP MED 11 20 MLT OPN (CLIP)
APR CLP SM 9.3 20 MLT OPN (CLIP) ×1
BAG DECANTER FOR FLEXI CONT (MISCELLANEOUS) ×2 IMPLANT
BLADE SURG 15 STRL LF DISP TIS (BLADE) ×2 IMPLANT
BLADE SURG 15 STRL SS (BLADE) ×4
BLADE SURG SZ11 CARB STEEL (BLADE) ×2 IMPLANT
BOOT SUTURE AID YELLOW STND (SUTURE) ×2 IMPLANT
BRUSH SCRUB EZ  4% CHG (MISCELLANEOUS) ×1
BRUSH SCRUB EZ 4% CHG (MISCELLANEOUS) ×1 IMPLANT
CANISTER SUCT 1200ML W/VALVE (MISCELLANEOUS) ×2 IMPLANT
CHLORAPREP W/TINT 26 (MISCELLANEOUS) ×4 IMPLANT
CLIP APPLIE 11 MED OPEN (CLIP) IMPLANT
CLIP APPLIE 9.375 SM OPEN (CLIP) IMPLANT
COVER PROBE FLX POLY STRL (MISCELLANEOUS) ×1 IMPLANT
COVER WAND RF STERILE (DRAPES) ×2 IMPLANT
DECANTER SPIKE VIAL GLASS SM (MISCELLANEOUS) ×1 IMPLANT
DERMABOND ADVANCED (GAUZE/BANDAGES/DRESSINGS) ×1
DERMABOND ADVANCED .7 DNX12 (GAUZE/BANDAGES/DRESSINGS) ×1 IMPLANT
DRESSING SURGICEL FIBRLLR 1X2 (HEMOSTASIS) ×1 IMPLANT
DRSG SURGICEL FIBRILLAR 1X2 (HEMOSTASIS) ×2
ELECT CAUTERY BLADE 6.4 (BLADE) ×2 IMPLANT
ELECT REM PT RETURN 9FT ADLT (ELECTROSURGICAL) ×2
ELECTRODE REM PT RTRN 9FT ADLT (ELECTROSURGICAL) ×1 IMPLANT
GAUZE 4X4 16PLY RFD (DISPOSABLE) ×1 IMPLANT
GEL ULTRASOUND 20GR AQUASONIC (MISCELLANEOUS) IMPLANT
GLOVE BIO SURGEON STRL SZ7 (GLOVE) ×5 IMPLANT
GLOVE INDICATOR 7.5 STRL GRN (GLOVE) ×3 IMPLANT
GLOVE SURG SYN 8.0 (GLOVE) ×2 IMPLANT
GLOVE SURG SYN 8.0 PF PI (GLOVE) ×1 IMPLANT
GOWN L4 XLG 20 PK N/S (GOWN DISPOSABLE) ×2 IMPLANT
GOWN STRL REUS W/ TWL LRG LVL3 (GOWN DISPOSABLE) ×2 IMPLANT
GOWN STRL REUS W/TWL LRG LVL3 (GOWN DISPOSABLE) ×4
IV NS 500ML (IV SOLUTION) ×2
IV NS 500ML BAXH (IV SOLUTION) ×1 IMPLANT
KIT TURNOVER KIT A (KITS) ×2 IMPLANT
LABEL OR SOLS (LABEL) ×2 IMPLANT
LOOP RED MAXI  1X406MM (MISCELLANEOUS) ×1
LOOP VESSEL MAXI 1X406 RED (MISCELLANEOUS) ×1 IMPLANT
LOOP VESSEL MINI 0.8X406 BLUE (MISCELLANEOUS) ×2 IMPLANT
LOOPS BLUE MINI 0.8X406MM (MISCELLANEOUS) ×2
NDL FILTER BLUNT 18X1 1/2 (NEEDLE) ×1 IMPLANT
NEEDLE FILTER BLUNT 18X 1/2SAF (NEEDLE) ×1
NEEDLE FILTER BLUNT 18X1 1/2 (NEEDLE) ×1 IMPLANT
PACK EXTREMITY ARMC (MISCELLANEOUS) ×2 IMPLANT
PAD PREP 24X41 OB/GYN DISP (PERSONAL CARE ITEMS) ×2 IMPLANT
STOCKINETTE 48X4 2 PLY STRL (GAUZE/BANDAGES/DRESSINGS) ×1 IMPLANT
STOCKINETTE ORTHO 4X25 (MISCELLANEOUS) ×1 IMPLANT
STOCKINETTE STRL 4IN 9604848 (GAUZE/BANDAGES/DRESSINGS) ×2 IMPLANT
SUT MNCRL+ 5-0 UNDYED PC-3 (SUTURE) ×2 IMPLANT
SUT MONOCRYL 5-0 (SUTURE) ×1
SUT PROLENE 6 0 BV (SUTURE) ×9 IMPLANT
SUT PROLENE 7 0 BV 1 (SUTURE) ×2 IMPLANT
SUT SILK 2 0 (SUTURE) ×4
SUT SILK 2 0 SH (SUTURE) IMPLANT
SUT SILK 2-0 18XBRD TIE 12 (SUTURE) ×1 IMPLANT
SUT SILK 3 0 (SUTURE) ×2
SUT SILK 3-0 18XBRD TIE 12 (SUTURE) ×1 IMPLANT
SUT SILK 4 0 (SUTURE) ×2
SUT SILK 4-0 18XBRD TIE 12 (SUTURE) ×1 IMPLANT
SUT VIC AB 2-0 CT1 (SUTURE) ×2 IMPLANT
SUT VIC AB 3-0 SH 27 (SUTURE) ×4
SUT VIC AB 3-0 SH 27X BRD (SUTURE) ×3 IMPLANT
SYR 20CC LL (SYRINGE) ×4 IMPLANT
SYR 3ML LL SCALE MARK (SYRINGE) ×2 IMPLANT

## 2018-11-18 NOTE — Op Note (Signed)
OPERATIVE NOTE   PROCEDURE: left basilic vein transposition (brachiobasilic arteriovenous fistula) creation   PRE-OPERATIVE DIAGNOSIS: End-stage renal disease  POST-OPERATIVE DIAGNOSIS: Same  SURGEON: Hortencia Pilar  ASSISTANT(S): Ms. Hezzie Bump  ANESTHESIA: General  ESTIMATED BLOOD LOSS: Minimal cc  FINDING(S): Basilic vein of good caliber  SPECIMEN(S):  None  INDICATIONS:   [name] is a 63 y.o. female who presents with end-stage renal disease. The patient is therefore undergoing creation of a fistula so that she will have adequate dialysis access in the future and avoid catheter placement..  The patient is scheduled for left second stage basilic vein transposition.  The patient is aware the risks include but are not limited to: bleeding, infection, steal syndrome, nerve damage, failure to mature, and need for additional procedures.  The patient is aware of the risks of the procedure and elects to proceed forward.  DESCRIPTION: After full informed written consent was obtained from the patient, the patient was brought back to the operating room and placed supine upon the operating table.  Prior to induction, the patient received IV antibiotics.   After obtaining adequate anesthesia, the patient's left arm was then prepped and draped in the standard fashion. Appropriate timeout is called  Preoperative vein mapping indicates a basilic vein has now matured nicely and is greater than 4 mm throughout its course.  Therefore the second stage of a basilic transposition will be performed to allow for an adequate access.   A first assistant was required to provide a safe and appropriate environment for executing the surgery.  The assistant was integral in providing retraction, exposure, running suture providing suction and in the closing process.  Attention was turned to first identifying the patient's basilic vein.  A longitudinal incision was made over the vein from the level of  the antecubital fossa up to its axillary extent.  The vein was dissected from the adjacent nerves.  The entirety of the basilic vein was mobilized and the vein was then marked with a surgical marker to maintain appropriate orientation. The vein was then transected near the antecubital fossa with 2-0 silk ties.  It was then irrigated with heparinized saline.   The vein was then approximated to the skin in order to plan for the position of the arterial anastomosis.  A small skin incision was created with a 15 blade scalpel at the apex of the vein. A small pocket was fashioned with a hemostat. The deep subcutaneous tissue was inspected for bleeding.  The basilic vein was then pulled subcutaneously using a Kelly clamp. The vein was then irrigated with heparinized saline to ensure that it had not been twisted.  The brachial artery is then dissected and looped with Silastic Vessel loops. Brachial artery is then controlled with the vessel loops and arteriotomy is made with an 11 blade and extended with Potts scissors and 6-0 Prolene stay sutures were placed. End vein to side brachial artery anastomosis is then fashioned with running 6-0 Prolene. Flushing maneuvers performed and flow was established through the fistula. Excellent thrill is noted within the vein 2+ radial pulses maintained at the wrist.   Bleeding was controlled with electrocautery and placement of large pieces of EvaSeal.  I washed out the surgical site after waiting a few minutes, and there was no further bleeding.  The fascia was reapproximated with interrupted stitches of 3-0 Vicryl to eliminate some of the deep space.  The superficial subcutaneous tissue was then reapproximated along the incision line with a running stitch  of 3-0 Vicryl.  The skin was then reapproximated with a running subcuticular of 4-0 Monocryl.  The skin was then cleaned, dried, and reinforced with Dermabond.    The patient tolerated this procedure well.   COMPLICATIONS:  None  CONDITION: [name]  Office: [phone]  11/18/2018,10:35 AM

## 2018-11-18 NOTE — Anesthesia Procedure Notes (Signed)
Procedure Name: Intubation Date/Time: 11/18/2018 8:00 AM Performed by: Allean Found, CRNA Pre-anesthesia Checklist: Patient identified, Patient being monitored, Timeout performed, Emergency Drugs available and Suction available Patient Re-evaluated:Patient Re-evaluated prior to induction Oxygen Delivery Method: Circle system utilized Preoxygenation: Pre-oxygenation with 100% oxygen Induction Type: IV induction Ventilation: Mask ventilation without difficulty Laryngoscope Size: 3 and McGraph Grade View: Grade I Tube type: Oral Tube size: 7.0 mm Number of attempts: 1 Airway Equipment and Method: Stylet Placement Confirmation: ETT inserted through vocal cords under direct vision,  positive ETCO2 and breath sounds checked- equal and bilateral Secured at: 21 cm Tube secured with: Tape Dental Injury: Teeth and Oropharynx as per pre-operative assessment  Difficulty Due To: Difficult Airway- due to large tongue and Difficult Airway- due to reduced neck mobility

## 2018-11-18 NOTE — Discharge Instructions (Signed)

## 2018-11-18 NOTE — Anesthesia Postprocedure Evaluation (Signed)
Anesthesia Post Note  Patient: [name]  Procedure(s) Performed: [place] (Left )  Patient location during evaluation: PACU Anesthesia Type: General Level of consciousness: awake and alert Pain management: pain level controlled Vital Signs Assessment: post-procedure vital signs reviewed and stable Respiratory status: spontaneous breathing, nonlabored ventilation and respiratory function stable Cardiovascular status: blood pressure returned to baseline and stable Postop Assessment: no apparent nausea or vomiting Anesthetic complications: no     Last Vitals:  Vitals:   11/18/18 1230 11/18/18 1314  BP: (!) 118/50 (!) 135/52  Pulse: 77 77  Resp: 16   Temp: 36.6 C   SpO2: 97% 99%    Last Pain:  Vitals:   11/18/18 1230  TempSrc: Temporal  PainSc: 0-No pain                 Durenda Hurt

## 2018-11-18 NOTE — H&P (Signed)
[facility] History & Physical Update  The patient was interviewed and re-examined.  The patient's previous History and Physical has been reviewed and is unchanged.  There is no change in the plan of care. We plan to proceed with the scheduled procedure.  Hortencia Pilar, MD  11/18/2018, 7:31 AM

## 2018-11-18 NOTE — Anesthesia Post-op Follow-up Note (Signed)
Anesthesia QCDR form completed.        

## 2018-11-18 NOTE — Transfer of Care (Signed)
Immediate Anesthesia Transfer of Care Note  Patient: [name]  Procedure(s) Performed: [place] (Left )  Patient Location: PACU  Anesthesia Type:General  Level of Consciousness: awake, alert  and oriented  Airway & Oxygen Therapy: Patient Spontanous Breathing and Patient connected to face mask oxygen  Post-op Assessment: Report given to RN and Post -op Vital signs reviewed and stable  Post vital signs: Reviewed and stable  Last Vitals:  Vitals Value Taken Time  BP 126/60 11/18/2018 11:28 AM  Temp 36.7 C 11/18/2018 11:28 AM  Pulse 78 11/18/2018 11:34 AM  Resp 13 11/18/2018 11:34 AM  SpO2 100 % 11/18/2018 11:34 AM  Vitals shown include unvalidated device data.  Last Pain:  Vitals:   11/18/18 0630  TempSrc: Tympanic  PainSc: 0-No pain         Complications: No apparent anesthesia complications

## 2018-11-18 NOTE — Anesthesia Preprocedure Evaluation (Addendum)
Anesthesia Evaluation  Patient identified by MRN, date of birth, ID band Patient awake    Reviewed: Allergy & Precautions, NPO status , Patient's Chart, lab work & pertinent test results  Airway Mallampati: III       Dental  (+) Missing, Poor Dentition   Pulmonary asthma , neg sleep apnea, COPD,  COPD inhaler, former smoker,           Cardiovascular hypertension, + CAD, + Past MI, + Cardiac Stents and +CHF (grade 1 diastolic dysfunction, normal systolic function)  + Valvular Problems/Murmurs (mild to moderate AS) AS  - Systolic murmurs Echo 4/54/09: - Left ventricle: The cavity size was normal. Wall thickness was   increased in a pattern of moderate LVH. Systolic function was   normal. The estimated ejection fraction was in the range of 60%   to 65%. Doppler parameters are consistent with abnormal left   ventricular relaxation (grade 1 diastolic dysfunction). - Aortic valve: There was mild to moderate stenosis. There was   trivial regurgitation. Valve area (VTI): 2.07 cm^2. Valve area   (Vmax): 1.69 cm^2. Valve area (Vmean): 1.67 cm^2. - Mitral valve: Calcified annulus. - Left atrium: The atrium was mildly dilated. - Atrial septum: No defect or patent foramen ovale was identified.   Neuro/Psych neg Seizures PSYCHIATRIC DISORDERS Depression negative neurological ROS     GI/Hepatic GERD  ,(+) Hepatitis -, C  Endo/Other  diabetes, Insulin DependentMorbid obesity  Renal/GU ESRF and DialysisRenal disease     Musculoskeletal  (+) Arthritis ,   Abdominal (+) + obese,   Peds  Hematology  (+) Blood dyscrasia, anemia ,   Anesthesia Other Findings Past Medical History: No date: Anemia No date: Aortic stenosis     Comment:  Echo 8/18: mean 13, peak 28, LVOT/AV mean velocity 0.51 No date: Arthritis No date: Asthma     Comment:  As a child  No date: Bronchitis No date: CAD (coronary artery disease)     Comment:  a.  09/2016: 50% Ost 1st Mrg stenosis, 50% 2nd Mrg               stenosis, 20% Mid-Cx, 95% Prox LAD, 40% mid-LAD, and 10%               dist-LAD stenosis. Staged PCI with DES to Prox-LAD.  2011: Chronic combined systolic and diastolic CHF (congestive heart  failure) (HCC)     Comment:  echo 2/18: EF 55-60, normal wall motion, grade 2               diastolic dysfunction, trivial AI // echo 3/18: Septal               and apical HK, EF 45-50, normal wall motion, trivial AI,               mild LAE, PASP 38 // echo 8/18: EF 60-65, normal wall               motion, grade 1 diastolic dysfunction, calcified aortic               valve leaflets, mild aortic stenosis (mean 13, peak 28,               LVOT/AV mean velocity 0.51), mild AI, moderate MAC, mild               LAE, trivial TR  No date: Chronic kidney disease     Comment:  STAGE 4 No date: Depression Dx 1989: Diabetes  mellitus No date: GERD (gastroesophageal reflux disease) No date: Gout Dx 2013: Hepatitis C Dx 1989: Hypertension 07/2015: Myocardial infarction ([place]) No date: Obesity No date: Obstructive sleep apnea 2013: Pancreatitis No date: Pneumonia No date: Refusal of blood transfusions as patient is Jehov[employer]'s Witness No date: Tendinitis No date: Tremors of nervous system     Comment:  LEFT HAND 2010: Ulcer   Reproductive/Obstetrics                           Anesthesia Physical  Anesthesia Plan  ASA: IV  Anesthesia Plan: General ETT   Post-op Pain Management:  Regional for Post-op pain   Induction: Intravenous  PONV Risk Score and Plan: 2  Airway Management Planned: Oral ETT  Additional Equipment:   Intra-op Plan:   Post-operative Plan: Extubation in OR  Informed Consent: I have reviewed the patients History and Physical, chart, labs and discussed the procedure including the risks, benefits and alternatives for the proposed anesthesia with the patient or authorized representative who has  indicated his/her understanding and acceptance.     Dental advisory given  Plan Discussed with: CRNA and Anesthesiologist  Anesthesia Plan Comments: (I offered a peripheral nerve block but pt declined as she did not like the way the block made her arm feel last time.  Risks/benefits discussed.  Plan GETA.  KLF)      Anesthesia Quick Evaluation

## 2018-11-19 NOTE — Addendum Note (Signed)
Addendum  created 11/19/18 0800 by Allean Found, CRNA   Intraprocedure Event edited

## 2018-11-21 ENCOUNTER — Encounter: Payer: Self-pay | Admitting: Vascular Surgery

## 2018-11-21 DIAGNOSIS — D509 Iron deficiency anemia, unspecified: Secondary | ICD-10-CM | POA: Diagnosis not present

## 2018-11-21 DIAGNOSIS — I5042 Chronic combined systolic (congestive) and diastolic (congestive) heart failure: Secondary | ICD-10-CM | POA: Diagnosis not present

## 2018-11-21 DIAGNOSIS — I2510 Atherosclerotic heart disease of native coronary artery without angina pectoris: Secondary | ICD-10-CM | POA: Diagnosis not present

## 2018-11-21 DIAGNOSIS — N2581 Secondary hyperparathyroidism of renal origin: Secondary | ICD-10-CM | POA: Diagnosis not present

## 2018-11-21 DIAGNOSIS — D631 Anemia in chronic kidney disease: Secondary | ICD-10-CM | POA: Diagnosis not present

## 2018-11-21 DIAGNOSIS — I132 Hypertensive heart and chronic kidney disease with heart failure and with stage 5 chronic kidney disease, or end stage renal disease: Secondary | ICD-10-CM | POA: Diagnosis not present

## 2018-11-21 DIAGNOSIS — S41102A Unspecified open wound of left upper arm, initial encounter: Secondary | ICD-10-CM | POA: Diagnosis not present

## 2018-11-21 DIAGNOSIS — Z48812 Encounter for surgical aftercare following surgery on the circulatory system: Secondary | ICD-10-CM | POA: Diagnosis not present

## 2018-11-21 DIAGNOSIS — E1122 Type 2 diabetes mellitus with diabetic chronic kidney disease: Secondary | ICD-10-CM | POA: Diagnosis not present

## 2018-11-21 DIAGNOSIS — N186 End stage renal disease: Secondary | ICD-10-CM | POA: Diagnosis not present

## 2018-11-21 LAB — POCT I-STAT 4, (NA,K, GLUC, HGB,HCT)
Glucose, Bld: 229 mg/dL — ABNORMAL HIGH (ref 70–99)
HCT: 36 % (ref 36.0–46.0)
Hemoglobin: 12.2 g/dL (ref 12.0–15.0)
Potassium: 4 mmol/L (ref 3.5–5.1)
Sodium: 132 mmol/L — ABNORMAL LOW (ref 135–145)

## 2018-11-23 DIAGNOSIS — S41102A Unspecified open wound of left upper arm, initial encounter: Secondary | ICD-10-CM | POA: Diagnosis not present

## 2018-11-23 DIAGNOSIS — N186 End stage renal disease: Secondary | ICD-10-CM | POA: Diagnosis not present

## 2018-11-23 DIAGNOSIS — D509 Iron deficiency anemia, unspecified: Secondary | ICD-10-CM | POA: Diagnosis not present

## 2018-11-23 DIAGNOSIS — D631 Anemia in chronic kidney disease: Secondary | ICD-10-CM | POA: Diagnosis not present

## 2018-11-23 DIAGNOSIS — N2581 Secondary hyperparathyroidism of renal origin: Secondary | ICD-10-CM | POA: Diagnosis not present

## 2018-11-24 ENCOUNTER — Other Ambulatory Visit (INDEPENDENT_AMBULATORY_CARE_PROVIDER_SITE_OTHER): Payer: Self-pay | Admitting: Nurse Practitioner

## 2018-11-24 ENCOUNTER — Telehealth (INDEPENDENT_AMBULATORY_CARE_PROVIDER_SITE_OTHER): Payer: Self-pay | Admitting: Nurse Practitioner

## 2018-11-24 DIAGNOSIS — D631 Anemia in chronic kidney disease: Secondary | ICD-10-CM | POA: Diagnosis not present

## 2018-11-24 DIAGNOSIS — N2581 Secondary hyperparathyroidism of renal origin: Secondary | ICD-10-CM | POA: Diagnosis not present

## 2018-11-24 DIAGNOSIS — D509 Iron deficiency anemia, unspecified: Secondary | ICD-10-CM | POA: Diagnosis not present

## 2018-11-24 DIAGNOSIS — L819 Disorder of pigmentation, unspecified: Secondary | ICD-10-CM

## 2018-11-24 DIAGNOSIS — S41102A Unspecified open wound of left upper arm, initial encounter: Secondary | ICD-10-CM | POA: Diagnosis not present

## 2018-11-24 DIAGNOSIS — N186 End stage renal disease: Secondary | ICD-10-CM

## 2018-11-24 DIAGNOSIS — R208 Other disturbances of skin sensation: Secondary | ICD-10-CM

## 2018-11-24 NOTE — Telephone Encounter (Signed)
Patient daughter called stating that patient has been in pain x 1 week since her surgery. States that her moms arm is purple and red and cold to the touch. No swelling.  Stated that her mother is at dialysis now and that the charge nurse requested daughter to call us to see if we could get patient in to be seen. They are concerned about arm. Please advise. AS, CMA

## 2018-11-24 NOTE — Telephone Encounter (Signed)
Let's do bring her in with an HDA since the arm is cold.  She can see me or schnier.

## 2018-11-24 NOTE — Telephone Encounter (Signed)
Patient has a appointment on 10-26-2018 with hda  And to see Eulogio Ditch.

## 2018-11-25 ENCOUNTER — Ambulatory Visit (INDEPENDENT_AMBULATORY_CARE_PROVIDER_SITE_OTHER): Payer: Medicare Other | Admitting: Nurse Practitioner

## 2018-11-25 ENCOUNTER — Encounter (INDEPENDENT_AMBULATORY_CARE_PROVIDER_SITE_OTHER): Payer: Self-pay | Admitting: Nurse Practitioner

## 2018-11-25 ENCOUNTER — Encounter (INDEPENDENT_AMBULATORY_CARE_PROVIDER_SITE_OTHER): Payer: Medicare Other

## 2018-11-25 ENCOUNTER — Other Ambulatory Visit: Payer: Self-pay

## 2018-11-25 ENCOUNTER — Ambulatory Visit (INDEPENDENT_AMBULATORY_CARE_PROVIDER_SITE_OTHER): Payer: Medicare Other

## 2018-11-25 VITALS — BP 120/69 | HR 83 | Resp 12 | Ht 68.0 in | Wt 286.0 lb

## 2018-11-25 DIAGNOSIS — N186 End stage renal disease: Secondary | ICD-10-CM | POA: Diagnosis not present

## 2018-11-25 DIAGNOSIS — L819 Disorder of pigmentation, unspecified: Secondary | ICD-10-CM

## 2018-11-25 DIAGNOSIS — J45909 Unspecified asthma, uncomplicated: Secondary | ICD-10-CM

## 2018-11-25 DIAGNOSIS — Z79899 Other long term (current) drug therapy: Secondary | ICD-10-CM

## 2018-11-25 DIAGNOSIS — I120 Hypertensive chronic kidney disease with stage 5 chronic kidney disease or end stage renal disease: Secondary | ICD-10-CM

## 2018-11-25 DIAGNOSIS — I10 Essential (primary) hypertension: Secondary | ICD-10-CM

## 2018-11-25 DIAGNOSIS — R208 Other disturbances of skin sensation: Secondary | ICD-10-CM

## 2018-11-25 DIAGNOSIS — Z992 Dependence on renal dialysis: Secondary | ICD-10-CM

## 2018-11-25 DIAGNOSIS — K219 Gastro-esophageal reflux disease without esophagitis: Secondary | ICD-10-CM

## 2018-11-25 DIAGNOSIS — Z87891 Personal history of nicotine dependence: Secondary | ICD-10-CM

## 2018-11-25 MED ORDER — DOXYCYCLINE HYCLATE 100 MG PO CAPS: 100.0000 mg | ORAL_CAPSULE | Freq: Two times a day (BID) | ORAL | 0 refills | Status: DC

## 2018-11-25 MED ORDER — OXYCODONE-ACETAMINOPHEN 5-325 MG PO TABS: 1.0000 | ORAL_TABLET | Freq: Four times a day (QID) | ORAL | 0 refills | Status: AC | PRN

## 2018-11-25 NOTE — Progress Notes (Signed)
SUBJECTIVE:  Patient ID: [name], female    DOB: 10/04/1955, 63 y.o.   MRN: [id number] Chief Complaint  Patient presents with   Follow-up    HPI  [name] is a 63 y.o. female that contacted our office with concerns about her arm following recent brachial basilic vein transposition surgery on 11/18/2018.  She states that she was at dialysis and they advised her to contact us.  She states that sometimes arm feels cold and that it is bruised and extremely painful.  She denies any fever, chills, nausea, vomiting or diarrhea.  She denies any chest pain or shortness of breath.  She states that she has been taking her pain medicine but it has not limited her pain.  She said that it is painful to move around.  Today the patient underwent hemodialysis duplex today and it revealed no evidence of steal syndrome.  The area is extremely edematous.  The left brachiobasilic trans-AV fistula is patent.  Arterial anastomosis shows moderate increased velocities.  There is antegrade flow proximal and distally in the radial artery the arm pain is superficial at the incision sites.  Limited views of the upper extremity veins appear patent as well.  Past Medical History:  Diagnosis Date   Anemia    Aortic stenosis    Echo 8/18: mean 13, peak 28, LVOT/AV mean velocity 0.51   Arthritis    Asthma    As a child    Bronchitis    CAD (coronary artery disease)    a. 09/2016: 50% Ost 1st Mrg stenosis, 50% 2nd Mrg stenosis, 20% Mid-Cx, 95% Prox LAD, 40% mid-LAD, and 10% dist-LAD stenosis. Staged PCI with DES to Prox-LAD.    Chronic combined systolic and diastolic CHF (congestive heart failure) ([place]) 2011   echo 2/18: EF 55-60, normal wall motion, grade 2 diastolic dysfunction, trivial AI // echo 3/18: Septal and apical HK, EF 45-50, normal wall motion, trivial AI, mild LAE, PASP 38 // echo 8/18: EF 60-65, normal wall motion, grade 1 diastolic dysfunction, calcified aortic valve leaflets, mild aortic  stenosis (mean 13, peak 28, LVOT/AV mean velocity 0.51), mild AI, moderate MAC, mild LAE, trivial TR    Chronic kidney disease    STAGE 4   Chronic kidney disease on chronic dialysis (HCC)    t, th, sat   Complication of anesthesia    Depression    Diabetes mellitus Dx 1989   GERD (gastroesophageal reflux disease)    Gout    Heart murmur    asymptomatic   Hepatitis C Dx 2013   Hypertension Dx 1989   Myocardial infarction ([place]) 07/2015   Obesity    Pancreatitis 2013   Pneumonia    Refusal of blood transfusions as patient is Jehovah's Witness    Tendinitis    Tremors of nervous system    LEFT HAND   Ulcer 2010    Past Surgical History:  Procedure Laterality Date   APPLICATION OF WOUND VAC Left 08/14/2017   Procedure: APPLICATION OF WOUND VAC Exchange;  Surgeon: Robert Bellow, MD;  Location: [facility];  Service: General;  Laterality: Left;   AV FISTULA PLACEMENT Left 08/19/2018   Procedure: ARTERIOVENOUS (AV) FISTULA CREATION ( BRACHIOBASILIC );  Surgeon: Katha Cabal, MD;  Location: [facility];  Service: Vascular;  Laterality: Left;   [place] Left 11/18/2018   Procedure: BASCILIC VEIN TRANSPOSITION;  Surgeon: Katha Cabal, MD;  Location: [facility];  Service: Vascular;  Laterality: Left;  CHOLECYSTECTOMY     COLONOSCOPY WITH PROPOFOL N/A 02/03/2018   Procedure: COLONOSCOPY WITH PROPOFOL;  Surgeon: Lin Landsman, MD;  Location: [facility] ENDOSCOPY;  Service: Gastroenterology;  Laterality: N/A;   CORONARY ANGIOPLASTY  07/2015   STENT   CORONARY STENT INTERVENTION N/A 09/18/2016   Procedure: Coronary Stent Intervention;  Surgeon: Troy Sine, MD;  Location: [place] [facility];  Service: Cardiovascular;  Laterality: N/A;   DIALYSIS/PERMA CATHETER INSERTION N/A 05/10/2018   Procedure: DIALYSIS/PERMA CATHETER INSERTION;  Surgeon: Katha Cabal, MD;  Location: [place] [facility];  Service: Cardiovascular;  Laterality: N/A;     DRESSING CHANGE UNDER ANESTHESIA Left 08/15/2017   Procedure: exploration of wound for bleeding;  Surgeon: Robert Bellow, MD;  Location: [facility];  Service: General;  Laterality: Left;   ESOPHAGOGASTRODUODENOSCOPY (EGD) WITH PROPOFOL N/A 02/03/2018   Procedure: ESOPHAGOGASTRODUODENOSCOPY (EGD) WITH PROPOFOL;  Surgeon: Lin Landsman, MD;  Location: [facility] ENDOSCOPY;  Service: Gastroenterology;  Laterality: N/A;   EYE SURGERY  11/17/2018   INCISION AND DRAINAGE ABSCESS Left 08/12/2017   Procedure: INCISION AND DRAINAGE ABSCESS;  Surgeon: Robert Bellow, MD;  Location: [facility];  Service: General;  Laterality: Left;   KNEE ARTHROSCOPY     LEFT HEART CATH N/A 09/18/2016   Procedure: Left Heart Cath;  Surgeon: Troy Sine, MD;  Location: [place] [facility];  Service: Cardiovascular;  Laterality: N/A;   LEFT HEART CATH AND CORONARY ANGIOGRAPHY N/A 09/16/2016   Procedure: Left Heart Cath and Coronary Angiography;  Surgeon: Burnell Blanks, MD;  Location: [place] [facility];  Service: Cardiovascular;  Laterality: N/A;   LEFT HEART CATH AND CORONARY ANGIOGRAPHY N/A 04/29/2017   Procedure: LEFT HEART CATH AND CORONARY ANGIOGRAPHY;  Surgeon: Nelva Bush, MD;  Location: [place] [facility];  Service: Cardiovascular;  Laterality: N/A;   LOWER EXTREMITY ANGIOGRAPHY Right 03/08/2018   Procedure: LOWER EXTREMITY ANGIOGRAPHY;  Surgeon: Katha Cabal, MD;  Location: [place] [facility];  Service: Cardiovascular;  Laterality: Right;   TUBAL LIGATION     TUBAL LIGATION      Social History   Socioeconomic History   Marital status: Divorced    Spouse name: Not on file   Number of children: Not on file   Years of education: Not on file   Highest education level: Not on file  Occupational History   Not on file  Social Needs   Financial resource strain: Not on file   Food insecurity:    Worry: Not on file    Inability: Not on file   Transportation needs:     Medical: Not on file    Non-medical: Not on file  Tobacco Use   Smoking status: Former Smoker    Packs/day: 0.25    Years: 6.00    Pack years: 1.50    Types: Cigarettes    Last attempt to quit: 10/25/1980    Years since quitting: 38.1   Smokeless tobacco: Never Used  Substance and Sexual Activity   Alcohol use: Yes    Comment: rarely   Drug use: Not Currently    Types: "Crack" cocaine, Marijuana    Comment: 08/21/2016 "clean since 05/1998"   Sexual activity: Not on file    Comment: Not asked  Lifestyle   Physical activity:    Days per week: Not on file    Minutes per session: Not on file   Stress: Not on file  Relationships   Social connections:    Talks  on phone: Not on file    Gets together: Not on file    Attends religious service: Not on file    Active member of club or organization: Not on file    Attends meetings of clubs or organizations: Not on file    Relationship status: Not on file   Intimate partner violence:    Fear of current or ex partner: Not on file    Emotionally abused: Not on file    Physically abused: Not on file    Forced sexual activity: Not on file  Other Topics Concern   Not on file  Social History Narrative   Not on file    Family History  Problem Relation Age of Onset   Colon cancer Mother    Heart attack Other    Heart attack Maternal Grandmother    Hypertension Sister    Hypertension Brother    Diabetes Paternal Grandmother    Breast cancer Neg Hx     Allergies  Allergen Reactions   Shellfish Allergy Anaphylaxis and Swelling   Diazepam Other (See Comments)    "felt like out of body experience"   Morphine And Related Itching     Review of Systems   Review of Systems: Negative Unless Checked Constitutional: _0 Weight loss  _1 Fever  _2 Chills Cardiac: _3 Chest pain   _4  Atrial Fibrillation  _5 Palpitations   _6 Shortness of breath when laying flat   _7 Shortness of breath with exertion. _8 Shortness of breath at  rest Vascular:  _9 Pain in legs with walking   _10 Pain in legs with standing _11 Pain in legs when laying flat   _12 Claudication    _13 Pain in feet when laying flat    _14 History of DVT   _15 Phlebitis   _16 Swelling in legs   _17 Varicose veins   _18 Non-healing ulcers Pulmonary:   _19 Uses home oxygen   _20 Productive cough   _21 Hemoptysis   _22 Wheeze  _23 COPD   _24 Asthma Neurologic:  _25 Dizziness   _26 Seizures  _27 Blackouts _28 History of stroke   _29 History of TIA  _30 Aphasia   _31 Temporary Blindness   _32 Weakness or numbness in arm   _33 Weakness or numbness in leg Musculoskeletal:   _34 Joint swelling   _35 Joint pain   _36 Low back pain  _37  History of Knee Replacement _38 Arthritis _39 back Surgeries  _40  Spinal Stenosis    Hematologic:  _41 Easy bruising  _42 Easy bleeding   _43 Hypercoagulable state   _44 Anemic Gastrointestinal:  _45 Diarrhea   _46 Vomiting  _47 Gastroesophageal reflux/heartburn   _48 Difficulty swallowing. _49 Abdominal pain Genitourinary:  _50 Chronic kidney disease   _51 Difficult urination  _52 Anuric   _53 Blood in urine _54 Frequent urination  _55 Burning with urination   _56 Hematuria Skin:  _57 Rashes   _58 Ulcers _59 Wounds Psychological:  _60 History of anxiety   _61  History of major depression  _62  Memory Difficulties      OBJECTIVE:   Physical Exam  BP 120/69 (BP Location: Right Wrist, Patient Position: Sitting, Cuff Size: Large)    Pulse 83    Resp 12    Ht _63  (1.727 m)    Wt 286 lb (129.7 kg)    BMI 43.49 kg/m   Gen: WD/WN, NAD Head: [place]/AT, No temporalis wasting.  Ear/Nose/Throat: Hearing grossly intact, nares w/o erythema or drainage Eyes: PER, EOMI, sclera nonicteric.  Neck: Supple, no masses.  No JVD.  Pulmonary:  Good air movement, no use of accessory muscles.  Cardiac: RRR Vascular:  Extreme swelling on left upper extremity following brachial basilic vein transposition.  Wound is well approximated.  Area of redness surrounding some areas  of the wound.  Little bruising present. Vessel Right Left  Radial Palpable Palpable     Gastrointestinal: soft, non-distended. No guarding/no peritoneal signs.  Musculoskeletal: M/S 5/5 throughout.  No deformity or atrophy.  Neurologic: Pain and light touch intact in extremities.  Symmetrical.  Speech is fluent. Motor exam as listed above. Psychiatric: Judgment intact, Mood & affect appropriate for pt's clinical situation. Dermatologic: No Venous rashes. No Ulcers Noted.  No changes consistent with cellulitis. Lymph : No Cervical lymphadenopathy, no lichenification or skin changes of chronic lymphedema.       ASSESSMENT AND PLAN:  1. ESRD on dialysis [facility])  Today the patient underwent hemodialysis duplex today and it revealed no evidence of steal syndrome.  The area is extremely edematous.  The left brachiobasilic trans-AV fistula is patent.  Arterial anastomosis shows moderate increased velocities.  There is antegrade flow proximal and distally in the radial artery the arm pain is superficial at the incision sites.  Limited views of the upper extremity veins appear patent as well.  Noninvasive studies show that there is no hemodynamically significant stenosis or steal syndrome.  Essentially the patient is extremely edematous following surgery.  Explained to the patient that following this type of procedure it is extremely normal to have pain, discomfort and swelling.  She should elevate her extremity as much as possible above the level of her heart to encourage draining.  She should also expect some discomfort from the procedure and that pain medication may not necessarily eliminate her pain together.  She should strive for a tolerable pain level versus being pain-free.  The only area of concern is near the incision it is erythematous, which concerns me slightly for possible infection, therefore we will also do a short course of antibiotics as well.  We will have the patient return in 6 weeks to determine if the fistula is mature and healed enough for usage.  Discussed with  the patient that once the fistula is mature she will be able to utilize it and wants to dialysis center deems her fistula to be functioning well we will schedule her PermCath removal. - doxycycline (VIBRAMYCIN) 100 MG capsule; Take 1 capsule (100 mg total) by mouth 2 (two) times daily.  Dispense: 14 capsule; Refill: 0 - oxyCODONE-acetaminophen (PERCOCET) 5-325 MG tablet; Take 1-2 tablets by mouth every 6 (six) hours as needed for up to 8 days for moderate pain or severe pain.  Dispense: 60 tablet; Refill: 0 - VAS US DUPLEX DIALYSIS ACCESS (AVF, AVG); Future  2. Essential hypertension Continue antihypertensive medications as already ordered, these medications have been reviewed and there are no changes at this time.   3. Gastroesophageal reflux disease without esophagitis Continue PPI as already ordered, this medication has been reviewed and there are no changes at this time.  Avoidence of caffeine and alcohol  Moderate elevation of the head of the bed   4. Uncomplicated asthma, unspecified asthma severity, unspecified whether persistent Continue pulmonary medications and aerosols as already ordered, these medications have been reviewed and there are no changes at this time.     Current Outpatient Medications on File Prior to Visit  Medication Sig Dispense Refill   albuterol (PROAIR HFA) 108 (90 Base) MCG/ACT inhaler INHALE 2 PUFFS BY MOUTH EVERY 4 HOURS AS NEEDED FOR SHORTNESS OF BREATH (Patient taking differently: Inhale 2 puffs into the lungs every 4 (four) hours as needed for wheezing or shortness of breath. INHALE 2 PUFFS BY MOUTH EVERY 4 HOURS AS NEEDED FOR  SHORTNESS OF BREATH) 8.5 g 5   allopurinol (ZYLOPRIM) 100 MG tablet TAKE 1 TABLET BY MOUTH ONCE DAILY. (Patient taking differently: Take 100 mg by mouth every morning. ) 90 tablet 1   aspirin EC 81 MG EC tablet Take 1 tablet (81 mg total) by mouth daily.     atorvastatin (LIPITOR) 80 MG tablet TAKE 1 TABLET BY MOUTH ONCE DAILY  AT 6PM (Patient taking differently: Take 80 mg by mouth every morning. ) 28 tablet 10   AURYXIA 1 GM 210 MG(Fe) tablet Take 420 mg by mouth 3 (three) times daily with meals.      carvedilol (COREG) 25 MG tablet TAKE (1) TABLET BY MOUTH TWICE A DAY WITH MEALS (BREAKFAST AND SUPPER) (Patient taking differently: Take 25 mg by mouth 2 (two) times daily with a meal. ) 56 tablet 10   docusate sodium (COLACE) 100 MG capsule Take 1 capsule (100 mg total) by mouth 2 (two) times daily as needed for mild constipation. (Patient taking differently: Take 100 mg by mouth daily as needed for mild constipation. ) 30 capsule 0   fluticasone (FLONASE) 50 MCG/ACT nasal spray Place 2 sprays into both nostrils daily as needed for allergies or rhinitis. 48 g 1   glucose blood (ACCU-CHEK GUIDE) test strip To check blood sugar before meals and bedtime 400 each 12   hydrALAZINE (APRESOLINE) 100 MG tablet TAKE (1) TABLET BY MOUTH THREE TIMES DAILY (Patient taking differently: Take 100 mg by mouth 3 (three) times daily. ) 90 tablet 5   hydrOXYzine (ATARAX/VISTARIL) 25 MG tablet TAKE (1) TABLET BY MOUTH EVERY SIX HOURS AS NEEDED FOR ITCHING. (Patient taking differently: Take 25 mg by mouth every 6 (six) hours as needed for itching. ) 120 tablet 10   Insulin Pen Needle (PEN NEEDLES) 30G X 8 MM MISC 1 each by Does not apply route 3 (three) times daily before meals. AC for novolog injections 100 each 11   Lancet Devices (ACCU-CHEK SOFTCLIX) lancets Use as instructed daily. 1 each 5   LANTUS SOLOSTAR 100 UNIT/ML Solostar Pen INJECT 60 UNITS S.Q. TWICE DAILY. 15 mL 10   liraglutide (VICTOZA) 18 MG/3ML SOPN Start with 0.67m daily and increase by 0.645mper week until max dose of 1.8 mg dose achieved. (Patient taking differently: Inject 1.2 mg into the skin every morning. Start with 0.27m4maily and increase by 0.27mg24mr week until max dose of 1.8 mg dose achieved.) 9 mL 10   loratadine (CLARITIN) 10 MG tablet TAKE 1 TABLET BY  MOUTH ONCE DAILY. (Patient taking differently: Take 10 mg by mouth every morning. ) 28 tablet 10   mometasone (ELOCON) 0.1 % cream Apply 1 application topically daily. (Patient taking differently: Apply 1 application topically daily as needed (rash). ) 45 g 0   mometasone-formoterol (DULERA) 200-5 MCG/ACT AERO Inhale 2 puffs into the lungs 2 (two) times daily. (Patient taking differently: Inhale 2 puffs into the lungs 2 (two) times daily as needed for wheezing or shortness of breath. ) 13 g 5   montelukast (SINGULAIR) 10 MG tablet TAKE ONE TABLET BY MOUTH AT BEDTIME. 28 tablet 10   nitroGLYCERIN (NITROSTAT) 0.4 MG SL tablet Place 1 tablet (0.4 mg total) under the tongue every 5 (five) minutes x 3 doses as needed for chest pain. 25 tablet 12   NOVOLOG FLEXPEN 100 UNIT/ML FlexPen INJECT 25 UNITS S.Q. THREE TIMES A DAY WITH MEALS. (Patient taking differently: Inject 30 Units into the skin 3 (three) times daily with meals. )  15 mL 10   omeprazole (PRILOSEC) 20 MG capsule TAKE (1) CAPSULE BY MOUTH ONCE DAILY. (Patient taking differently: Take 20 mg by mouth every morning. ) 28 capsule 10   pregabalin (LYRICA) 75 MG capsule Take 1 capsule (75 mg total) by mouth 2 (two) times daily. 180 capsule 1   SENNA PLUS 8.6-50 MG tablet TAKE ONE TABLET BY MOUTH AT BEDTIME. 28 tablet 10   silver sulfADIAZINE (SILVADENE) 1 % cream Apply 1 application topically daily. (Patient taking differently: Apply 1 application topically daily as needed (sore). ) 50 g 0   torsemide (DEMADEX) 20 MG tablet TAKE (2) TABLETS BY MOUTH TWICE DAILY. (Patient taking differently: Take 40 mg by mouth 2 (two) times daily. ) 120 tablet 5   traMADol (ULTRAM) 50 MG tablet One to Two Tabs Every Six Hours as Needed For Pain (Patient taking differently: Take 50-100 mg by mouth every 6 (six) hours as needed (pain). One to Two Tabs Every Six Hours as Needed For Pain) 40 tablet 0   triamcinolone cream (KENALOG) 0.1 % APPLY TO AFFECTED AREA  TWICE DAILY (Patient taking differently: Apply 1 application topically daily as needed (itching). ) 80 g 0   valACYclovir (VALTREX) 500 MG tablet TAKE (1) TABLET BY MOUTH EVERY OTHER DAY. 14 tablet 10   Vitamin D, Ergocalciferol, (DRISDOL) 50000 units CAPS capsule TAKE 1 CAPSULE BY MOUTH ONCE A MONTH (Patient taking differently: Take 50,000 Units by mouth every 30 (thirty) days. ) 3 capsule 3   Accu-Chek FastClix Lancets MISC USE AS DIRECTED TO CHECK BLOOD SUGAR THREE TIMES DAILY AND AT BEDTIME 102 each 0   multivitamin (RENA-VIT) TABS tablet Take 1 tablet by mouth daily.     [DISCONTINUED] gabapentin (NEURONTIN) 100 MG capsule Take 1 capsule (100 mg total) by mouth 3 (three) times daily. 90 capsule 1   No current facility-administered medications on file prior to visit.     There are no Patient Instructions on file for this visit. Return in about 6 weeks (around 01/06/2019) for HDA.   Kris Hartmann, NP  This note was completed with Sales executive.  Any errors are purely unintentional.

## 2018-11-26 DIAGNOSIS — D509 Iron deficiency anemia, unspecified: Secondary | ICD-10-CM | POA: Diagnosis not present

## 2018-11-26 DIAGNOSIS — N2581 Secondary hyperparathyroidism of renal origin: Secondary | ICD-10-CM | POA: Diagnosis not present

## 2018-11-26 DIAGNOSIS — N186 End stage renal disease: Secondary | ICD-10-CM | POA: Diagnosis not present

## 2018-11-26 DIAGNOSIS — D631 Anemia in chronic kidney disease: Secondary | ICD-10-CM | POA: Diagnosis not present

## 2018-11-26 DIAGNOSIS — S41102A Unspecified open wound of left upper arm, initial encounter: Secondary | ICD-10-CM | POA: Diagnosis not present

## 2018-11-28 ENCOUNTER — Telehealth (INDEPENDENT_AMBULATORY_CARE_PROVIDER_SITE_OTHER): Payer: Self-pay | Admitting: Vascular Surgery

## 2018-11-28 DIAGNOSIS — Z1159 Encounter for screening for other viral diseases: Secondary | ICD-10-CM | POA: Diagnosis not present

## 2018-11-28 NOTE — Telephone Encounter (Signed)
I spoke with the patient and inform her we don't carry sleeves in our office and I recommended her to call either dialysis center or nephrologist to see if they carry the sleeve she is requesting

## 2018-11-29 DIAGNOSIS — D631 Anemia in chronic kidney disease: Secondary | ICD-10-CM | POA: Diagnosis not present

## 2018-11-29 DIAGNOSIS — N2581 Secondary hyperparathyroidism of renal origin: Secondary | ICD-10-CM | POA: Diagnosis not present

## 2018-11-29 DIAGNOSIS — D509 Iron deficiency anemia, unspecified: Secondary | ICD-10-CM | POA: Diagnosis not present

## 2018-11-29 DIAGNOSIS — S41102A Unspecified open wound of left upper arm, initial encounter: Secondary | ICD-10-CM | POA: Diagnosis not present

## 2018-11-29 DIAGNOSIS — N186 End stage renal disease: Secondary | ICD-10-CM | POA: Diagnosis not present

## 2018-11-30 ENCOUNTER — Telehealth: Payer: Self-pay

## 2018-11-30 DIAGNOSIS — I5042 Chronic combined systolic (congestive) and diastolic (congestive) heart failure: Secondary | ICD-10-CM | POA: Diagnosis not present

## 2018-11-30 DIAGNOSIS — I2510 Atherosclerotic heart disease of native coronary artery without angina pectoris: Secondary | ICD-10-CM | POA: Diagnosis not present

## 2018-11-30 DIAGNOSIS — Z48812 Encounter for surgical aftercare following surgery on the circulatory system: Secondary | ICD-10-CM | POA: Diagnosis not present

## 2018-11-30 DIAGNOSIS — S41102A Unspecified open wound of left upper arm, initial encounter: Secondary | ICD-10-CM | POA: Diagnosis not present

## 2018-11-30 DIAGNOSIS — N2581 Secondary hyperparathyroidism of renal origin: Secondary | ICD-10-CM | POA: Diagnosis not present

## 2018-11-30 DIAGNOSIS — N186 End stage renal disease: Secondary | ICD-10-CM | POA: Diagnosis not present

## 2018-11-30 DIAGNOSIS — E1122 Type 2 diabetes mellitus with diabetic chronic kidney disease: Secondary | ICD-10-CM | POA: Diagnosis not present

## 2018-11-30 DIAGNOSIS — D631 Anemia in chronic kidney disease: Secondary | ICD-10-CM | POA: Diagnosis not present

## 2018-11-30 DIAGNOSIS — D509 Iron deficiency anemia, unspecified: Secondary | ICD-10-CM | POA: Diagnosis not present

## 2018-11-30 DIAGNOSIS — I132 Hypertensive heart and chronic kidney disease with heart failure and with stage 5 chronic kidney disease, or end stage renal disease: Secondary | ICD-10-CM | POA: Diagnosis not present

## 2018-11-30 NOTE — Telephone Encounter (Signed)
[place] hensley, LPN from [employer] called asking if pt can have a written order to keep port in left upper inside arm clean until they put the new port in that arm.  Pt is on antibiotics.    Order can be faxed to: [phone]. Call back [phone].

## 2018-12-01 ENCOUNTER — Encounter: Payer: Self-pay | Admitting: Physician Assistant

## 2018-12-01 ENCOUNTER — Other Ambulatory Visit: Payer: Self-pay | Admitting: Physician Assistant

## 2018-12-01 DIAGNOSIS — Z79899 Other long term (current) drug therapy: Secondary | ICD-10-CM | POA: Diagnosis not present

## 2018-12-01 DIAGNOSIS — Z955 Presence of coronary angioplasty implant and graft: Secondary | ICD-10-CM | POA: Diagnosis not present

## 2018-12-01 DIAGNOSIS — I10 Essential (primary) hypertension: Secondary | ICD-10-CM | POA: Diagnosis not present

## 2018-12-01 DIAGNOSIS — H25811 Combined forms of age-related cataract, right eye: Secondary | ICD-10-CM | POA: Diagnosis not present

## 2018-12-01 DIAGNOSIS — Z992 Dependence on renal dialysis: Secondary | ICD-10-CM | POA: Diagnosis not present

## 2018-12-01 DIAGNOSIS — B182 Chronic viral hepatitis C: Secondary | ICD-10-CM | POA: Diagnosis not present

## 2018-12-01 DIAGNOSIS — Z87891 Personal history of nicotine dependence: Secondary | ICD-10-CM | POA: Diagnosis not present

## 2018-12-01 DIAGNOSIS — E1165 Type 2 diabetes mellitus with hyperglycemia: Secondary | ICD-10-CM

## 2018-12-01 DIAGNOSIS — IMO0002 Reserved for concepts with insufficient information to code with codable children: Secondary | ICD-10-CM

## 2018-12-01 DIAGNOSIS — I5032 Chronic diastolic (congestive) heart failure: Secondary | ICD-10-CM | POA: Diagnosis not present

## 2018-12-01 DIAGNOSIS — Z794 Long term (current) use of insulin: Secondary | ICD-10-CM | POA: Diagnosis not present

## 2018-12-01 DIAGNOSIS — E1122 Type 2 diabetes mellitus with diabetic chronic kidney disease: Secondary | ICD-10-CM | POA: Diagnosis not present

## 2018-12-01 DIAGNOSIS — I2510 Atherosclerotic heart disease of native coronary artery without angina pectoris: Secondary | ICD-10-CM | POA: Diagnosis not present

## 2018-12-01 DIAGNOSIS — I132 Hypertensive heart and chronic kidney disease with heart failure and with stage 5 chronic kidney disease, or end stage renal disease: Secondary | ICD-10-CM | POA: Diagnosis not present

## 2018-12-01 DIAGNOSIS — N186 End stage renal disease: Secondary | ICD-10-CM | POA: Diagnosis not present

## 2018-12-01 NOTE — Telephone Encounter (Signed)
Order faxed.

## 2018-12-01 NOTE — Telephone Encounter (Signed)
Rx written and can be faxed

## 2018-12-03 DIAGNOSIS — D509 Iron deficiency anemia, unspecified: Secondary | ICD-10-CM | POA: Diagnosis not present

## 2018-12-03 DIAGNOSIS — N186 End stage renal disease: Secondary | ICD-10-CM | POA: Diagnosis not present

## 2018-12-03 DIAGNOSIS — D631 Anemia in chronic kidney disease: Secondary | ICD-10-CM | POA: Diagnosis not present

## 2018-12-03 DIAGNOSIS — N2581 Secondary hyperparathyroidism of renal origin: Secondary | ICD-10-CM | POA: Diagnosis not present

## 2018-12-03 DIAGNOSIS — S41102A Unspecified open wound of left upper arm, initial encounter: Secondary | ICD-10-CM | POA: Diagnosis not present

## 2018-12-06 ENCOUNTER — Encounter: Payer: Self-pay | Admitting: Physician Assistant

## 2018-12-06 ENCOUNTER — Telehealth (INDEPENDENT_AMBULATORY_CARE_PROVIDER_SITE_OTHER): Payer: Self-pay | Admitting: Vascular Surgery

## 2018-12-06 DIAGNOSIS — S41102A Unspecified open wound of left upper arm, initial encounter: Secondary | ICD-10-CM | POA: Diagnosis not present

## 2018-12-06 DIAGNOSIS — N186 End stage renal disease: Secondary | ICD-10-CM | POA: Diagnosis not present

## 2018-12-06 DIAGNOSIS — D631 Anemia in chronic kidney disease: Secondary | ICD-10-CM | POA: Diagnosis not present

## 2018-12-06 DIAGNOSIS — N2581 Secondary hyperparathyroidism of renal origin: Secondary | ICD-10-CM | POA: Diagnosis not present

## 2018-12-06 DIAGNOSIS — D509 Iron deficiency anemia, unspecified: Secondary | ICD-10-CM | POA: Diagnosis not present

## 2018-12-06 NOTE — Telephone Encounter (Signed)
Per Arna Medici- call in Doxycycline 100mg  BID x14days. Patient is to take antibiotic and have apt scheduled to see Schnier with HDA next week.  Patient is aware of the above and the medication called into [facility] on [address] per Patient request.  Can you please call patient and schedule with Schnier with HDA per Arna Medici for next week. AS, CMA

## 2018-12-07 ENCOUNTER — Encounter: Payer: Self-pay | Admitting: Physician Assistant

## 2018-12-07 DIAGNOSIS — L299 Pruritus, unspecified: Secondary | ICD-10-CM

## 2018-12-07 MED ORDER — HYDROXYZINE HCL 25 MG PO TABS: 25.0000 mg | ORAL_TABLET | Freq: Four times a day (QID) | ORAL | 10 refills | Status: DC | PRN

## 2018-12-08 ENCOUNTER — Other Ambulatory Visit: Payer: Self-pay | Admitting: Physician Assistant

## 2018-12-08 DIAGNOSIS — S41102A Unspecified open wound of left upper arm, initial encounter: Secondary | ICD-10-CM | POA: Diagnosis not present

## 2018-12-08 DIAGNOSIS — D631 Anemia in chronic kidney disease: Secondary | ICD-10-CM | POA: Diagnosis not present

## 2018-12-08 DIAGNOSIS — N2581 Secondary hyperparathyroidism of renal origin: Secondary | ICD-10-CM | POA: Diagnosis not present

## 2018-12-08 DIAGNOSIS — D509 Iron deficiency anemia, unspecified: Secondary | ICD-10-CM | POA: Diagnosis not present

## 2018-12-08 DIAGNOSIS — L299 Pruritus, unspecified: Secondary | ICD-10-CM

## 2018-12-08 DIAGNOSIS — N186 End stage renal disease: Secondary | ICD-10-CM | POA: Diagnosis not present

## 2018-12-09 DIAGNOSIS — E1122 Type 2 diabetes mellitus with diabetic chronic kidney disease: Secondary | ICD-10-CM | POA: Diagnosis not present

## 2018-12-09 DIAGNOSIS — Z48812 Encounter for surgical aftercare following surgery on the circulatory system: Secondary | ICD-10-CM | POA: Diagnosis not present

## 2018-12-09 DIAGNOSIS — I5042 Chronic combined systolic (congestive) and diastolic (congestive) heart failure: Secondary | ICD-10-CM | POA: Diagnosis not present

## 2018-12-09 DIAGNOSIS — I2510 Atherosclerotic heart disease of native coronary artery without angina pectoris: Secondary | ICD-10-CM | POA: Diagnosis not present

## 2018-12-09 DIAGNOSIS — N186 End stage renal disease: Secondary | ICD-10-CM | POA: Diagnosis not present

## 2018-12-09 DIAGNOSIS — I132 Hypertensive heart and chronic kidney disease with heart failure and with stage 5 chronic kidney disease, or end stage renal disease: Secondary | ICD-10-CM | POA: Diagnosis not present

## 2018-12-10 DIAGNOSIS — N186 End stage renal disease: Secondary | ICD-10-CM | POA: Diagnosis not present

## 2018-12-10 DIAGNOSIS — D509 Iron deficiency anemia, unspecified: Secondary | ICD-10-CM | POA: Diagnosis not present

## 2018-12-10 DIAGNOSIS — D631 Anemia in chronic kidney disease: Secondary | ICD-10-CM | POA: Diagnosis not present

## 2018-12-10 DIAGNOSIS — S41102A Unspecified open wound of left upper arm, initial encounter: Secondary | ICD-10-CM | POA: Diagnosis not present

## 2018-12-10 DIAGNOSIS — N2581 Secondary hyperparathyroidism of renal origin: Secondary | ICD-10-CM | POA: Diagnosis not present

## 2018-12-12 DIAGNOSIS — N186 End stage renal disease: Secondary | ICD-10-CM | POA: Diagnosis not present

## 2018-12-12 DIAGNOSIS — E1122 Type 2 diabetes mellitus with diabetic chronic kidney disease: Secondary | ICD-10-CM | POA: Diagnosis not present

## 2018-12-12 DIAGNOSIS — Z992 Dependence on renal dialysis: Secondary | ICD-10-CM | POA: Diagnosis not present

## 2018-12-13 DIAGNOSIS — D509 Iron deficiency anemia, unspecified: Secondary | ICD-10-CM | POA: Diagnosis not present

## 2018-12-13 DIAGNOSIS — S41102A Unspecified open wound of left upper arm, initial encounter: Secondary | ICD-10-CM | POA: Diagnosis not present

## 2018-12-13 DIAGNOSIS — N186 End stage renal disease: Secondary | ICD-10-CM | POA: Diagnosis not present

## 2018-12-13 DIAGNOSIS — S51832A Puncture wound without foreign body of left forearm, initial encounter: Secondary | ICD-10-CM | POA: Diagnosis not present

## 2018-12-13 DIAGNOSIS — D631 Anemia in chronic kidney disease: Secondary | ICD-10-CM | POA: Diagnosis not present

## 2018-12-13 DIAGNOSIS — N2581 Secondary hyperparathyroidism of renal origin: Secondary | ICD-10-CM | POA: Diagnosis not present

## 2018-12-13 DIAGNOSIS — E1129 Type 2 diabetes mellitus with other diabetic kidney complication: Secondary | ICD-10-CM | POA: Diagnosis not present

## 2018-12-14 ENCOUNTER — Encounter (INDEPENDENT_AMBULATORY_CARE_PROVIDER_SITE_OTHER): Payer: Medicare Other

## 2018-12-14 ENCOUNTER — Ambulatory Visit (INDEPENDENT_AMBULATORY_CARE_PROVIDER_SITE_OTHER): Payer: Medicare Other | Admitting: Nurse Practitioner

## 2018-12-14 DIAGNOSIS — E1122 Type 2 diabetes mellitus with diabetic chronic kidney disease: Secondary | ICD-10-CM | POA: Diagnosis not present

## 2018-12-14 DIAGNOSIS — I5042 Chronic combined systolic (congestive) and diastolic (congestive) heart failure: Secondary | ICD-10-CM | POA: Diagnosis not present

## 2018-12-14 DIAGNOSIS — I2510 Atherosclerotic heart disease of native coronary artery without angina pectoris: Secondary | ICD-10-CM | POA: Diagnosis not present

## 2018-12-14 DIAGNOSIS — Z48812 Encounter for surgical aftercare following surgery on the circulatory system: Secondary | ICD-10-CM | POA: Diagnosis not present

## 2018-12-14 DIAGNOSIS — N186 End stage renal disease: Secondary | ICD-10-CM | POA: Diagnosis not present

## 2018-12-14 DIAGNOSIS — I132 Hypertensive heart and chronic kidney disease with heart failure and with stage 5 chronic kidney disease, or end stage renal disease: Secondary | ICD-10-CM | POA: Diagnosis not present

## 2018-12-15 ENCOUNTER — Telehealth (INDEPENDENT_AMBULATORY_CARE_PROVIDER_SITE_OTHER): Payer: Self-pay

## 2018-12-15 ENCOUNTER — Telehealth (INDEPENDENT_AMBULATORY_CARE_PROVIDER_SITE_OTHER): Payer: Self-pay | Admitting: Vascular Surgery

## 2018-12-15 DIAGNOSIS — S41102A Unspecified open wound of left upper arm, initial encounter: Secondary | ICD-10-CM | POA: Diagnosis not present

## 2018-12-15 DIAGNOSIS — S51832A Puncture wound without foreign body of left forearm, initial encounter: Secondary | ICD-10-CM | POA: Diagnosis not present

## 2018-12-15 DIAGNOSIS — N2581 Secondary hyperparathyroidism of renal origin: Secondary | ICD-10-CM | POA: Diagnosis not present

## 2018-12-15 DIAGNOSIS — N186 End stage renal disease: Secondary | ICD-10-CM | POA: Diagnosis not present

## 2018-12-15 DIAGNOSIS — D631 Anemia in chronic kidney disease: Secondary | ICD-10-CM | POA: Diagnosis not present

## 2018-12-15 DIAGNOSIS — D509 Iron deficiency anemia, unspecified: Secondary | ICD-10-CM | POA: Diagnosis not present

## 2018-12-15 NOTE — Telephone Encounter (Signed)
Patient called inquring about getting an appt. I advised her that a message was out to the provider and we are waiting for a response.

## 2018-12-15 NOTE — Telephone Encounter (Signed)
Patient's daughter called and left a message for a return call to the patient. The message did not state a question. I attempted to contact the patient and had to leave a message for a return call.

## 2018-12-15 NOTE — Telephone Encounter (Signed)
Can you please schedule patient an apt per Maudie Mercury. AS, CMA

## 2018-12-15 NOTE — Telephone Encounter (Signed)
[name] with [facility] called and said that patients wound has dehisced and that patient is complaining of pain. AS, CMA

## 2018-12-15 NOTE — Telephone Encounter (Signed)
Spoke with the patient and the nurse at dialysis center. The patients wound on her arm has opened up with pain. The patient has been put on Vancomycin.

## 2018-12-15 NOTE — Telephone Encounter (Signed)
Schedule the patient to see a provider.

## 2018-12-16 ENCOUNTER — Other Ambulatory Visit: Payer: Self-pay | Admitting: Physician Assistant

## 2018-12-16 ENCOUNTER — Encounter (INDEPENDENT_AMBULATORY_CARE_PROVIDER_SITE_OTHER): Payer: Self-pay | Admitting: Vascular Surgery

## 2018-12-16 ENCOUNTER — Ambulatory Visit (INDEPENDENT_AMBULATORY_CARE_PROVIDER_SITE_OTHER): Payer: Medicare Other | Admitting: Vascular Surgery

## 2018-12-16 ENCOUNTER — Other Ambulatory Visit: Payer: Self-pay

## 2018-12-16 VITALS — BP 156/66 | HR 93 | Resp 16 | Wt 289.4 lb

## 2018-12-16 DIAGNOSIS — F329 Major depressive disorder, single episode, unspecified: Secondary | ICD-10-CM | POA: Diagnosis not present

## 2018-12-16 DIAGNOSIS — M1990 Unspecified osteoarthritis, unspecified site: Secondary | ICD-10-CM | POA: Diagnosis not present

## 2018-12-16 DIAGNOSIS — Z79899 Other long term (current) drug therapy: Secondary | ICD-10-CM

## 2018-12-16 DIAGNOSIS — D631 Anemia in chronic kidney disease: Secondary | ICD-10-CM | POA: Diagnosis not present

## 2018-12-16 DIAGNOSIS — Z794 Long term (current) use of insulin: Secondary | ICD-10-CM | POA: Diagnosis not present

## 2018-12-16 DIAGNOSIS — M1A39X0 Chronic gout due to renal impairment, multiple sites, without tophus (tophi): Secondary | ICD-10-CM

## 2018-12-16 DIAGNOSIS — E1165 Type 2 diabetes mellitus with hyperglycemia: Secondary | ICD-10-CM

## 2018-12-16 DIAGNOSIS — T829XXA Unspecified complication of cardiac and vascular prosthetic device, implant and graft, initial encounter: Secondary | ICD-10-CM

## 2018-12-16 DIAGNOSIS — Z992 Dependence on renal dialysis: Secondary | ICD-10-CM | POA: Diagnosis not present

## 2018-12-16 DIAGNOSIS — I120 Hypertensive chronic kidney disease with stage 5 chronic kidney disease or end stage renal disease: Secondary | ICD-10-CM

## 2018-12-16 DIAGNOSIS — IMO0002 Reserved for concepts with insufficient information to code with codable children: Secondary | ICD-10-CM

## 2018-12-16 DIAGNOSIS — Z87891 Personal history of nicotine dependence: Secondary | ICD-10-CM | POA: Diagnosis not present

## 2018-12-16 DIAGNOSIS — I5042 Chronic combined systolic (congestive) and diastolic (congestive) heart failure: Secondary | ICD-10-CM | POA: Diagnosis not present

## 2018-12-16 DIAGNOSIS — N186 End stage renal disease: Secondary | ICD-10-CM

## 2018-12-16 DIAGNOSIS — I2510 Atherosclerotic heart disease of native coronary artery without angina pectoris: Secondary | ICD-10-CM | POA: Diagnosis not present

## 2018-12-16 DIAGNOSIS — E669 Obesity, unspecified: Secondary | ICD-10-CM | POA: Diagnosis not present

## 2018-12-16 DIAGNOSIS — E1122 Type 2 diabetes mellitus with diabetic chronic kidney disease: Secondary | ICD-10-CM

## 2018-12-16 DIAGNOSIS — I10 Essential (primary) hypertension: Secondary | ICD-10-CM

## 2018-12-16 DIAGNOSIS — G4733 Obstructive sleep apnea (adult) (pediatric): Secondary | ICD-10-CM | POA: Diagnosis not present

## 2018-12-16 DIAGNOSIS — Z48812 Encounter for surgical aftercare following surgery on the circulatory system: Secondary | ICD-10-CM | POA: Diagnosis not present

## 2018-12-16 DIAGNOSIS — I132 Hypertensive heart and chronic kidney disease with heart failure and with stage 5 chronic kidney disease, or end stage renal disease: Secondary | ICD-10-CM | POA: Diagnosis not present

## 2018-12-16 NOTE — Assessment & Plan Note (Signed)
Will need to use the catheter for now.  Trying to salvage the fistula.

## 2018-12-16 NOTE — Progress Notes (Signed)
Patient ID: [name], female   DOB: 05/27/1956, 63 y.o.   MRN: [id number]  Chief Complaint  Patient presents with   Follow-up    left arm wound dehised    HPI [name] is a 63 y.o. female.  Patient returns in follow-up.  Her left arm wound has separated and she has severe swelling and pain in the left her fistula was a basilic transposition with her most recent surgery several weeks ago.  She is still using her catheter for dialysis.  She was started on antibiotics by her dialysis center this week.   Past Medical History:  Diagnosis Date   Anemia    Aortic stenosis    Echo 8/18: mean 13, peak 28, LVOT/AV mean velocity 0.51   Arthritis    Asthma    As a child    Bronchitis    CAD (coronary artery disease)    a. 09/2016: 50% Ost 1st Mrg stenosis, 50% 2nd Mrg stenosis, 20% Mid-Cx, 95% Prox LAD, 40% mid-LAD, and 10% dist-LAD stenosis. Staged PCI with DES to Prox-LAD.    Chronic combined systolic and diastolic CHF (congestive heart failure) ([place]) 2011   echo 2/18: EF 55-60, normal wall motion, grade 2 diastolic dysfunction, trivial AI // echo 3/18: Septal and apical HK, EF 45-50, normal wall motion, trivial AI, mild LAE, PASP 38 // echo 8/18: EF 60-65, normal wall motion, grade 1 diastolic dysfunction, calcified aortic valve leaflets, mild aortic stenosis (mean 13, peak 28, LVOT/AV mean velocity 0.51), mild AI, moderate MAC, mild LAE, trivial TR    Chronic kidney disease    STAGE 4   Chronic kidney disease on chronic dialysis (HCC)    t, th, sat   Complication of anesthesia    Depression    Diabetes mellitus Dx 1989   GERD (gastroesophageal reflux disease)    Gout    Heart murmur    asymptomatic   Hepatitis C Dx 2013   Hypertension Dx 1989   Myocardial infarction ([place]) 07/2015   Obesity    Pancreatitis 2013   Pneumonia    Refusal of blood transfusions as patient is Jehov[employer]'s Witness    Tendinitis    Tremors of nervous system    LEFT HAND     Ulcer 2010    Past Surgical History:  Procedure Laterality Date   APPLICATION OF WOUND VAC Left 08/14/2017   Procedure: APPLICATION OF WOUND VAC Exchange;  Surgeon: Robert Bellow, MD;  Location: [facility];  Service: General;  Laterality: Left;   AV FISTULA PLACEMENT Left 08/19/2018   Procedure: ARTERIOVENOUS (AV) FISTULA CREATION ( BRACHIOBASILIC );  Surgeon: Katha Cabal, MD;  Location: [facility];  Service: Vascular;  Laterality: Left;   BASCILIC VEIN TRANSPOSITION Left 11/18/2018   Procedure: BASCILIC VEIN TRANSPOSITION;  Surgeon: Katha Cabal, MD;  Location: [facility];  Service: Vascular;  Laterality: Left;   CHOLECYSTECTOMY     COLONOSCOPY WITH PROPOFOL N/A 02/03/2018   Procedure: COLONOSCOPY WITH PROPOFOL;  Surgeon: Lin Landsman, MD;  Location: [facility] ENDOSCOPY;  Service: Gastroenterology;  Laterality: N/A;   CORONARY ANGIOPLASTY  07/2015   STENT   CORONARY STENT INTERVENTION N/A 09/18/2016   Procedure: Coronary Stent Intervention;  Surgeon: Troy Sine, MD;  Location: [place] [facility];  Service: Cardiovascular;  Laterality: N/A;   DIALYSIS/PERMA CATHETER INSERTION N/A 05/10/2018   Procedure: DIALYSIS/PERMA CATHETER INSERTION;  Surgeon: Katha Cabal, MD;  Location: [place] [facility];  Service: Cardiovascular;  Laterality: N/A;   DRESSING CHANGE UNDER ANESTHESIA Left 08/15/2017   Procedure: exploration of wound for bleeding;  Surgeon: Robert Bellow, MD;  Location: [facility];  Service: General;  Laterality: Left;   ESOPHAGOGASTRODUODENOSCOPY (EGD) WITH PROPOFOL N/A 02/03/2018   Procedure: ESOPHAGOGASTRODUODENOSCOPY (EGD) WITH PROPOFOL;  Surgeon: Lin Landsman, MD;  Location[facility] ENDOSCOPY;  Service: Gastroenterology;  Laterality: N/A;   EYE SURGERY  11/17/2018   INCISION AND DRAINAGE ABSCESS Left 08/12/2017   Procedure: INCISION AND DRAINAGE ABSCESS;  Surgeon: Robert Bellow, MD;  Location: [facility];  Service: General;  Laterality: Left;    KNEE ARTHROSCOPY     LEFT HEART CATH N/A 09/18/2016   Procedure: Left Heart Cath;  Surgeon: Troy Sine, MD;  Location: [place] [facility];  Service: Cardiovascular;  Laterality: N/A;   LEFT HEART CATH AND CORONARY ANGIOGRAPHY N/A 09/16/2016   Procedure: Left Heart Cath and Coronary Angiography;  Surgeon: Burnell Blanks, MD;  Location: [place] [facility];  Service: Cardiovascular;  Laterality: N/A;   LEFT HEART CATH AND CORONARY ANGIOGRAPHY N/A 04/29/2017   Procedure: LEFT HEART CATH AND CORONARY ANGIOGRAPHY;  Surgeon: Nelva Bush, MD;  Location: [place] [facility];  Service: Cardiovascular;  Laterality: N/A;   LOWER EXTREMITY ANGIOGRAPHY Right 03/08/2018   Procedure: LOWER EXTREMITY ANGIOGRAPHY;  Surgeon: Katha Cabal, MD;  Location: [place] [facility];  Service: Cardiovascular;  Laterality: Right;   TUBAL LIGATION     TUBAL LIGATION        Allergies  Allergen Reactions   Shellfish Allergy Anaphylaxis and Swelling   Diazepam Other (See Comments)    "felt like out of body experience"   Morphine And Related Itching    Current Outpatient Medications  Medication Sig Dispense Refill   Accu-Chek FastClix Lancets MISC USE AS DIRECTED TO CHECK BLOOD SUGAR THREE TIMES DAILY AND AT BEDTIME 102 each 0   albuterol (PROAIR HFA) 108 (90 Base) MCG/ACT inhaler INHALE 2 PUFFS BY MOUTH EVERY 4 HOURS AS NEEDED FOR SHORTNESS OF BREATH (Patient taking differently: Inhale 2 puffs into the lungs every 4 (four) hours as needed for wheezing or shortness of breath. INHALE 2 PUFFS BY MOUTH EVERY 4 HOURS AS NEEDED FOR SHORTNESS OF BREATH) 8.5 g 5   allopurinol (ZYLOPRIM) 100 MG tablet TAKE 1 TABLET BY MOUTH ONCE DAILY. (Patient taking differently: Take 100 mg by mouth every morning. ) 90 tablet 1   aspirin EC 81 MG EC tablet Take 1 tablet (81 mg total) by mouth daily.     atorvastatin (LIPITOR) 80 MG tablet TAKE 1 TABLET BY MOUTH ONCE DAILY AT 6PM (Patient taking differently:  Take 80 mg by mouth every morning. ) 28 tablet 10   AURYXIA 1 GM 210 MG(Fe) tablet Take 420 mg by mouth 3 (three) times daily with meals.      carvedilol (COREG) 25 MG tablet TAKE (1) TABLET BY MOUTH TWICE A DAY WITH MEALS (BREAKFAST AND SUPPER) (Patient taking differently: Take 25 mg by mouth 2 (two) times daily with a meal. ) 56 tablet 10   docusate sodium (COLACE) 100 MG capsule Take 1 capsule (100 mg total) by mouth 2 (two) times daily as needed for mild constipation. (Patient taking differently: Take 100 mg by mouth daily as needed for mild constipation. ) 30 capsule 0   doxycycline (VIBRAMYCIN) 100 MG capsule Take 1 capsule (100 mg total) by mouth 2 (two) times daily. 14 capsule 0   fluticasone (FLONASE) 50 MCG/ACT nasal spray  Place 2 sprays into both nostrils daily as needed for allergies or rhinitis. 48 g 1   glucose blood (ACCU-CHEK GUIDE) test strip To check blood sugar before meals and bedtime 400 each 12   hydrALAZINE (APRESOLINE) 100 MG tablet TAKE (1) TABLET BY MOUTH THREE TIMES DAILY (Patient taking differently: Take 100 mg by mouth 3 (three) times daily. ) 90 tablet 5   hydrOXYzine (ATARAX/VISTARIL) 25 MG tablet Take 1 tablet (25 mg total) by mouth every 6 (six) hours as needed for itching. 120 tablet 10   Lancet Devices (ACCU-CHEK SOFTCLIX) lancets Use as instructed daily. 1 each 5   LANTUS SOLOSTAR 100 UNIT/ML Solostar Pen INJECT 60 UNITS S.Q. TWICE DAILY. 15 mL 10   liraglutide (VICTOZA) 18 MG/3ML SOPN Start with 0.17m daily and increase by 0.688mper week until max dose of 1.8 mg dose achieved. (Patient taking differently: Inject 1.2 mg into the skin every morning. Start with 0.33m33maily and increase by 0.33mg63mr week until max dose of 1.8 mg dose achieved.) 9 mL 10   loratadine (CLARITIN) 10 MG tablet TAKE 1 TABLET BY MOUTH ONCE DAILY. (Patient taking differently: Take 10 mg by mouth every morning. ) 28 tablet 10   mometasone (ELOCON) 0.1 % cream APPLY AS DIRECTED TO  AFFECTED AREA ONCE DAILY. 45 g 10   mometasone-formoterol (DULERA) 200-5 MCG/ACT AERO Inhale 2 puffs into the lungs 2 (two) times daily. (Patient taking differently: Inhale 2 puffs into the lungs 2 (two) times daily as needed for wheezing or shortness of breath. ) 13 g 5   montelukast (SINGULAIR) 10 MG tablet TAKE ONE TABLET BY MOUTH AT BEDTIME. 28 tablet 10   multivitamin (RENA-VIT) TABS tablet Take 1 tablet by mouth daily.     nitroGLYCERIN (NITROSTAT) 0.4 MG SL tablet Place 1 tablet (0.4 mg total) under the tongue every 5 (five) minutes x 3 doses as needed for chest pain. 25 tablet 12   NOVOLOG FLEXPEN 100 UNIT/ML FlexPen INJECT 25 UNITS S.Q. THREE TIMES A DAY WITH MEALS. (Patient taking differently: Inject 30 Units into the skin 3 (three) times daily with meals. ) 15 mL 10   omeprazole (PRILOSEC) 20 MG capsule TAKE (1) CAPSULE BY MOUTH ONCE DAILY. (Patient taking differently: Take 20 mg by mouth every morning. ) 28 capsule 10   pregabalin (LYRICA) 75 MG capsule Take 1 capsule (75 mg total) by mouth 2 (two) times daily. 180 capsule 1   SENNA PLUS 8.6-50 MG tablet TAKE ONE TABLET BY MOUTH AT BEDTIME. 28 tablet 10   silver sulfADIAZINE (SILVADENE) 1 % cream Apply 1 application topically daily. (Patient taking differently: Apply 1 application topically daily as needed (sore). ) 50 g 0   torsemide (DEMADEX) 20 MG tablet TAKE (2) TABLETS BY MOUTH TWICE DAILY. (Patient taking differently: Take 40 mg by mouth 2 (two) times daily. ) 120 tablet 5   traMADol (ULTRAM) 50 MG tablet One to Two Tabs Every Six Hours as Needed For Pain (Patient taking differently: Take 50-100 mg by mouth every 6 (six) hours as needed (pain). One to Two Tabs Every Six Hours as Needed For Pain) 40 tablet 0   triamcinolone cream (KENALOG) 0.1 % APPLY TO AFFECTED AREA TWICE DAILY (Patient taking differently: Apply 1 application topically daily as needed (itching). ) 80 g 0   ULTICARE SHORT PEN NEEDLES 31G X 8 MM MISC USE  THREE TIMES DAILY WITH INSULIN 100 each 0   valACYclovir (VALTREX) 500 MG tablet TAKE (1) TABLET BY  MOUTH EVERY OTHER DAY. 14 tablet 10   Vitamin D, Ergocalciferol, (DRISDOL) 50000 units CAPS capsule TAKE 1 CAPSULE BY MOUTH ONCE A MONTH (Patient taking differently: Take 50,000 Units by mouth every 30 (thirty) days. ) 3 capsule 3   No current facility-administered medications for this visit.         Physical Exam BP (!) 156/66 (BP Location: Right Arm)    Pulse 93    Resp 16    Wt 289 lb 6.4 oz (131.3 kg)    BMI 44.00 kg/m  Gen:  WD/WN, NAD Skin: Wound has 1 to 2 cm of separation throughout with wider areas and some spots.  The underlying adipose tissue is pale and unhealthy appearing.  There is still a good thrill in the AV fistula.     Assessment/Plan:  Essential hypertension blood pressure control important in reducing the progression of atherosclerotic disease. On appropriate oral medications.   ESRD on dialysis [facility]) Will need to use the catheter for now.  Trying to salvage the fistula.  Diabetes mellitus type 2, uncontrolled, with complications (HCC) blood glucose control important in reducing the progression of atherosclerotic disease. Also, involved in wound healing. On appropriate medications.   Complication from renal dialysis device Patient's left arm wound will need to be debrided.  I think will be best managed with a VAC.  It has 2 to 3 cm of separation throughout and the incision is basically the whole length of the upper arm.  She has already been started on antibiotics by her dialysis unit so no further antibiotics will be given.  I think a negative pressure dressing will help keep the wound healed and give [place] the best chance to salvage the fistula.      Leotis Pain 12/16/2018, 2:53 PM   This note was created with Dragon medical transcription system.  Any errors from dictation are unintentional.

## 2018-12-16 NOTE — Assessment & Plan Note (Signed)
blood glucose control important in reducing the progression of atherosclerotic disease. Also, involved in wound healing. On appropriate medications.  

## 2018-12-16 NOTE — Assessment & Plan Note (Signed)
Patient's left arm wound will need to be debrided.  I think will be best managed with a VAC.  It has 2 to 3 cm of separation throughout and the incision is basically the whole length of the upper arm.  She has already been started on antibiotics by her dialysis unit so no further antibiotics will be given.  I think a negative pressure dressing will help keep the wound healed and give [place] the best chance to salvage the fistula.

## 2018-12-16 NOTE — Assessment & Plan Note (Signed)
blood pressure control important in reducing the progression of atherosclerotic disease. On appropriate oral medications.  

## 2018-12-17 DIAGNOSIS — D509 Iron deficiency anemia, unspecified: Secondary | ICD-10-CM | POA: Diagnosis not present

## 2018-12-17 DIAGNOSIS — N2581 Secondary hyperparathyroidism of renal origin: Secondary | ICD-10-CM | POA: Diagnosis not present

## 2018-12-17 DIAGNOSIS — N186 End stage renal disease: Secondary | ICD-10-CM | POA: Diagnosis not present

## 2018-12-17 DIAGNOSIS — S51832A Puncture wound without foreign body of left forearm, initial encounter: Secondary | ICD-10-CM | POA: Diagnosis not present

## 2018-12-17 DIAGNOSIS — S41102A Unspecified open wound of left upper arm, initial encounter: Secondary | ICD-10-CM | POA: Diagnosis not present

## 2018-12-17 DIAGNOSIS — D631 Anemia in chronic kidney disease: Secondary | ICD-10-CM | POA: Diagnosis not present

## 2018-12-18 ENCOUNTER — Emergency Department
Admission: EM | Admit: 2018-12-18 | Discharge: 2018-12-18 | Disposition: A | Discharge status: Home / Self Care | Payer: Medicare Other | Attending: Emergency Medicine | Admitting: Emergency Medicine

## 2018-12-18 ENCOUNTER — Encounter: Payer: Self-pay | Admitting: Emergency Medicine

## 2018-12-18 ENCOUNTER — Other Ambulatory Visit: Payer: Self-pay

## 2018-12-18 DIAGNOSIS — T8149XA Infection following a procedure, other surgical site, initial encounter: Secondary | ICD-10-CM | POA: Diagnosis not present

## 2018-12-18 DIAGNOSIS — Z87891 Personal history of nicotine dependence: Secondary | ICD-10-CM | POA: Diagnosis not present

## 2018-12-18 DIAGNOSIS — I252 Old myocardial infarction: Secondary | ICD-10-CM | POA: Insufficient documentation

## 2018-12-18 DIAGNOSIS — Z79899 Other long term (current) drug therapy: Secondary | ICD-10-CM | POA: Diagnosis not present

## 2018-12-18 DIAGNOSIS — T8140XA Infection following a procedure, unspecified, initial encounter: Secondary | ICD-10-CM | POA: Diagnosis not present

## 2018-12-18 DIAGNOSIS — Z7982 Long term (current) use of aspirin: Secondary | ICD-10-CM | POA: Insufficient documentation

## 2018-12-18 DIAGNOSIS — I120 Hypertensive chronic kidney disease with stage 5 chronic kidney disease or end stage renal disease: Secondary | ICD-10-CM | POA: Diagnosis not present

## 2018-12-18 DIAGNOSIS — E1122 Type 2 diabetes mellitus with diabetic chronic kidney disease: Secondary | ICD-10-CM | POA: Diagnosis not present

## 2018-12-18 DIAGNOSIS — N186 End stage renal disease: Secondary | ICD-10-CM | POA: Diagnosis not present

## 2018-12-18 DIAGNOSIS — I5042 Chronic combined systolic (congestive) and diastolic (congestive) heart failure: Secondary | ICD-10-CM | POA: Insufficient documentation

## 2018-12-18 DIAGNOSIS — J45909 Unspecified asthma, uncomplicated: Secondary | ICD-10-CM | POA: Insufficient documentation

## 2018-12-18 DIAGNOSIS — Y658 Other specified misadventures during surgical and medical care: Secondary | ICD-10-CM | POA: Insufficient documentation

## 2018-12-18 DIAGNOSIS — E1140 Type 2 diabetes mellitus with diabetic neuropathy, unspecified: Secondary | ICD-10-CM | POA: Diagnosis not present

## 2018-12-18 DIAGNOSIS — Z992 Dependence on renal dialysis: Secondary | ICD-10-CM | POA: Diagnosis not present

## 2018-12-18 DIAGNOSIS — I2510 Atherosclerotic heart disease of native coronary artery without angina pectoris: Secondary | ICD-10-CM | POA: Insufficient documentation

## 2018-12-18 DIAGNOSIS — Z794 Long term (current) use of insulin: Secondary | ICD-10-CM | POA: Insufficient documentation

## 2018-12-18 LAB — CBC WITH DIFFERENTIAL/PLATELET
Abs Immature Granulocytes: 0.13 10*3/uL — ABNORMAL HIGH (ref 0.00–0.07)
Basophils Absolute: 0 10*3/uL (ref 0.0–0.1)
Basophils Relative: 0 %
Eosinophils Absolute: 0.2 10*3/uL (ref 0.0–0.5)
Eosinophils Relative: 2 %
HCT: 33.5 % — ABNORMAL LOW (ref 36.0–46.0)
Hemoglobin: 10.4 g/dL — ABNORMAL LOW (ref 12.0–15.0)
Immature Granulocytes: 2 %
Lymphocytes Relative: 20 %
Lymphs Abs: 1.5 10*3/uL (ref 0.7–4.0)
MCH: 29.5 pg (ref 26.0–34.0)
MCHC: 31 g/dL (ref 30.0–36.0)
MCV: 94.9 fL (ref 80.0–100.0)
Monocytes Absolute: 0.9 10*3/uL (ref 0.1–1.0)
Monocytes Relative: 12 %
Neutro Abs: 4.9 10*3/uL (ref 1.7–7.7)
Neutrophils Relative %: 64 %
Platelets: 214 10*3/uL (ref 150–400)
RBC: 3.53 MIL/uL — ABNORMAL LOW (ref 3.87–5.11)
RDW: 16.9 % — ABNORMAL HIGH (ref 11.5–15.5)
WBC: 7.6 10*3/uL (ref 4.0–10.5)
nRBC: 0 % (ref 0.0–0.2)

## 2018-12-18 LAB — BASIC METABOLIC PANEL
Anion gap: 10 (ref 5–15)
BUN: 37 mg/dL — ABNORMAL HIGH (ref 6–20)
CO2: 28 mmol/L (ref 22–32)
Calcium: 8.1 mg/dL — ABNORMAL LOW (ref 8.9–10.3)
Chloride: 97 mmol/L — ABNORMAL LOW (ref 98–111)
Creatinine, Ser: 5.73 mg/dL — ABNORMAL HIGH (ref 0.44–1.00)
GFR calc Af Amer: 9 mL/min — ABNORMAL LOW (ref >60)
GFR calc non Af Amer: 7 mL/min — ABNORMAL LOW (ref >60)
Glucose, Bld: 194 mg/dL — ABNORMAL HIGH (ref 70–99)
Potassium: 4.2 mmol/L (ref 3.5–5.1)
Sodium: 135 mmol/L (ref 135–145)

## 2018-12-18 MED ORDER — HYDROMORPHONE HCL 2 MG PO TABS: 2.0000 mg | ORAL_TABLET | Freq: Two times a day (BID) | ORAL | 0 refills | Status: AC | PRN

## 2018-12-18 MED ORDER — MORPHINE SULFATE (PF) 4 MG/ML IV SOLN
4.0000 mg | Freq: Once | INTRAVENOUS | Status: AC
  Administered 2018-12-18: 4 mg via INTRAVENOUS
  Filled 2018-12-18: qty 1

## 2018-12-18 MED ORDER — ONDANSETRON HCL 4 MG/2ML IJ SOLN
4.0000 mg | Freq: Once | INTRAMUSCULAR | Status: AC
  Administered 2018-12-18: 4 mg via INTRAVENOUS
  Filled 2018-12-18: qty 2

## 2018-12-18 MED ORDER — HYDROMORPHONE HCL 1 MG/ML IJ SOLN
1.0000 mg | Freq: Once | INTRAMUSCULAR | Status: AC
  Administered 2018-12-18: 1 mg via INTRAVENOUS
  Filled 2018-12-18: qty 1

## 2018-12-18 MED ORDER — HYDROMORPHONE HCL 2 MG PO TABS: 2.0000 mg | ORAL_TABLET | Freq: Two times a day (BID) | ORAL | 0 refills | Status: DC | PRN

## 2018-12-18 NOTE — ED Notes (Signed)
Pt's daughter was the one who called and she called back; pt spoke with her briefly and then this nurse spoke with her at pt's request to update her on plan of care, lab results, etc; questions answered; daughter and pt appreciative of update;

## 2018-12-18 NOTE — ED Triage Notes (Signed)
Pt to ED via POV, pt had surgery on her left upper arm in mid May, pt states that around the end of May the wound started to bust open. Pt states that the area was bleeding today, she called the on call doctor for [facility] and they advised her to come to the ED to be evaluated

## 2018-12-18 NOTE — ED Notes (Signed)
Pt's sats noted to drop on monitor to 88%; in to check on pt; she had nodded off to sleep after dilaudid; put on 2L via [place] and sats up to 98%; pain down to 2/10

## 2018-12-18 NOTE — ED Provider Notes (Signed)
[facility] Emergency Department Provider Note  ____________________________________________  Time seen: Approximately 6:19 PM  I have reviewed the triage vital signs and the nursing notes.   HISTORY  Chief Complaint Wound Dehiscence    HPI [name] is a 63 y.o. female with a history of diabetes, pancreatitis, end-stage renal disease on hemodialysis who complains of pain and swelling and redness in the left upper arm around a dehisced surgical wound from the creation of her left upper extremity AV fistula a month ago.  She saw vascular surgery 2 days ago who plans a debridement procedure this week and wound VAC.  I reviewed their clinic note.  Today she started noticing some bleeding from the wound, called on-call number for vascular surgery who advised her to come to the ED for evaluation.  Denies chest pain shortness of breath fever chills sweats body aches.  No dizziness or syncope.  Had her normal dialysis session yesterday through her tunneled right IJ catheter.  Pain is constant, severe, nonradiating, no aggravating or alleviating factors.      Past Medical History:  Diagnosis Date  . Anemia   . Aortic stenosis    Echo 8/18: mean 13, peak 28, LVOT/AV mean velocity 0.51  . Arthritis   . Asthma    As a child   . Bronchitis   . CAD (coronary artery disease)    a. 09/2016: 50% Ost 1st Mrg stenosis, 50% 2nd Mrg stenosis, 20% Mid-Cx, 95% Prox LAD, 40% mid-LAD, and 10% dist-LAD stenosis. Staged PCI with DES to Prox-LAD.   [name] Chronic combined systolic and diastolic CHF (congestive heart failure) ([place]) 2011   echo 2/18: EF 55-60, normal wall motion, grade 2 diastolic dysfunction, trivial AI // echo 3/18: Septal and apical HK, EF 45-50, normal wall motion, trivial AI, mild LAE, PASP 38 // echo 8/18: EF 60-65, normal wall motion, grade 1 diastolic dysfunction, calcified aortic valve leaflets, mild aortic stenosis (mean 13, peak 28, LVOT/AV mean velocity 0.51),  mild AI, moderate MAC, mild LAE, trivial TR   . Chronic kidney disease    STAGE 4  . Chronic kidney disease on chronic dialysis (HCC)    t, th, sat  . Complication of anesthesia   . Depression   . Diabetes mellitus Dx 1989  . GERD (gastroesophageal reflux disease)   . Gout   . Heart murmur    asymptomatic  . Hepatitis C Dx 2013  . Hypertension Dx 1989  . Myocardial infarction ([place]) 07/2015  . Obesity   . Pancreatitis 2013  . Pneumonia   . Refusal of blood transfusions as patient is Jehovah's Witness   . Tendinitis   . Tremors of nervous system    LEFT HAND  . Ulcer 2010     Patient Active Problem List   Diagnosis Date Noted  . Complication from renal dialysis device 10/20/2018  . ESRD on dialysis ([place]) 06/01/2018  . ARF (acute renal failure) ([place]) 05/09/2018  . Colon cancer screening   . LGSIL on Pap smear of cervix 01/27/2018  . Gout 12/25/2017  . AKI (acute kidney injury) ([place]) 12/24/2017  . Hyperglycemia 12/24/2017  . Chronic diastolic heart failure ([place]) 09/30/2017  . Lymphedema 09/30/2017  . Aortic stenosis   . Chronic pain of right knee 02/12/2017  . Chronic gout due to renal impairment of multiple sites without tophus 02/12/2017  . Esophageal dysphagia 02/12/2017  . Osteoarthritis 01/22/2017  . Diabetic hyperosmolar non-ketotic state ([place]) 12/13/2016  . CAD (coronary artery  disease) 12/13/2016  . Hyperlipidemia 12/13/2016  . Hyperphosphatemia 12/13/2016  . Asthma 11/29/2015  . Depression 09/05/2015  . GERD (gastroesophageal reflux disease) 03/25/2015  . Environmental allergies 03/14/2015  . Chronic hepatitis C without hepatic coma ([place]) 01/31/2015  . Diabetic neuropathy ([place]) 01/31/2015  . OSA (obstructive sleep apnea) 01/01/2015  . Poor dentition 11/13/2014  . Essential hypertension 10/08/2014  . Obesity 10/08/2014  . Diabetes mellitus type 2, uncontrolled, with complications ([place]) 09/32/6712    Class: Chronic     Past Surgical History:   Procedure Laterality Date  . APPLICATION OF WOUND VAC Left 08/14/2017   Procedure: APPLICATION OF WOUND VAC Exchange;  Surgeon: Robert Bellow, MD;  Location: [facility] ORS;  Service: General;  Laterality: Left;  . AV FISTULA PLACEMENT Left 08/19/2018   Procedure: ARTERIOVENOUS (AV) FISTULA CREATION ( BRACHIOBASILIC );  Surgeon: Katha Cabal, MD;  Location: [facility] ORS;  Service: Vascular;  Laterality: Left;  . BASCILIC VEIN TRANSPOSITION Left 11/18/2018   Procedure: BASCILIC VEIN TRANSPOSITION;  Surgeon: Katha Cabal, MD;  Location: [facility] ORS;  Service: Vascular;  Laterality: Left;  . CHOLECYSTECTOMY    . COLONOSCOPY WITH PROPOFOL N/A 02/03/2018   Procedure: COLONOSCOPY WITH PROPOFOL;  Surgeon: Lin Landsman, MD;  Location: [facility] ENDOSCOPY;  Service: Gastroenterology;  Laterality: N/A;  . CORONARY ANGIOPLASTY  07/2015   STENT  . CORONARY STENT INTERVENTION N/A 09/18/2016   Procedure: Coronary Stent Intervention;  Surgeon: Troy Sine, MD;  Location: [place] CV LAB;  Service: Cardiovascular;  Laterality: N/A;  . DIALYSIS/PERMA CATHETER INSERTION N/A 05/10/2018   Procedure: DIALYSIS/PERMA CATHETER INSERTION;  Surgeon: Katha Cabal, MD;  Location: [place] CV LAB;  Service: Cardiovascular;  Laterality: N/A;  . DRESSING CHANGE UNDER ANESTHESIA Left 08/15/2017   Procedure: exploration of wound for bleeding;  Surgeon: Robert Bellow, MD;  Location: [facility] ORS;  Service: General;  Laterality: Left;  . ESOPHAGOGASTRODUODENOSCOPY (EGD) WITH PROPOFOL N/A 02/03/2018   Procedure: ESOPHAGOGASTRODUODENOSCOPY (EGD) WITH PROPOFOL;  Surgeon: Lin Landsman, MD;  Location: [facility] ENDOSCOPY;  Service: Gastroenterology;  Laterality: N/A;  . EYE SURGERY  11/17/2018  . INCISION AND DRAINAGE ABSCESS Left 08/12/2017   Procedure: INCISION AND DRAINAGE ABSCESS;  Surgeon: Robert Bellow, MD;  Location: [facility] ORS;  Service: General;  Laterality: Left;  . KNEE ARTHROSCOPY    . LEFT HEART CATH  N/A 09/18/2016   Procedure: Left Heart Cath;  Surgeon: Troy Sine, MD;  Location: [place] CV LAB;  Service: Cardiovascular;  Laterality: N/A;  . LEFT HEART CATH AND CORONARY ANGIOGRAPHY N/A 09/16/2016   Procedure: Left Heart Cath and Coronary Angiography;  Surgeon: Burnell Blanks, MD;  Location: [place] CV LAB;  Service: Cardiovascular;  Laterality: N/A;  . LEFT HEART CATH AND CORONARY ANGIOGRAPHY N/A 04/29/2017   Procedure: LEFT HEART CATH AND CORONARY ANGIOGRAPHY;  Surgeon: Nelva Bush, MD;  Location: [place] CV LAB;  Service: Cardiovascular;  Laterality: N/A;  . LOWER EXTREMITY ANGIOGRAPHY Right 03/08/2018   Procedure: LOWER EXTREMITY ANGIOGRAPHY;  Surgeon: Katha Cabal, MD;  Location: [place] CV LAB;  Service: Cardiovascular;  Laterality: Right;  . TUBAL LIGATION    . TUBAL LIGATION       Prior to Admission medications   Medication Sig Start Date End Date Taking? Authorizing Provider  Accu-Chek FastClix Lancets MISC USE AS DIRECTED TO CHECK BLOOD SUGAR THREE TIMES DAILY AND AT BEDTIME 09/28/18   [name], PA-C  albuterol [facility] HFA) [phone] Base) MCG/ACT  inhaler INHALE 2 PUFFS BY MOUTH EVERY 4 HOURS AS NEEDED FOR SHORTNESS OF BREATH Patient taking differently: Inhale 2 puffs into the lungs every 4 (four) hours as needed for wheezing or shortness of breath. INHALE 2 PUFFS BY MOUTH EVERY 4 HOURS AS NEEDED FOR SHORTNESS OF BREATH 06/24/18   [name], PA-C  allopurinol (ZYLOPRIM) 100 MG tablet Take 1 tablet (100 mg total) by mouth every morning. 12/16/18   [name], PA-C  aspirin EC 81 MG EC tablet Take 1 tablet (81 mg total) by mouth daily. 09/19/16   Strader, Fransisco Hertz, PA-C  atorvastatin (LIPITOR) 80 MG tablet TAKE 1 TABLET BY MOUTH ONCE DAILY AT 6PM Patient taking differently: Take 80 mg by mouth every morning.  08/23/18   Burnette, Clearnce Sorrel, PA-C  AURYXIA 1 GM 210 MG(Fe) tablet Take 420 mg by mouth 3 (three) times daily  with meals.  10/10/18   [provider]  carvedilol (COREG) 25 MG tablet TAKE (1) TABLET BY MOUTH TWICE A DAY WITH MEALS (BREAKFAST AND SUPPER) Patient taking differently: Take 25 mg by mouth 2 (two) times daily with a meal.  08/23/18   Burnette, Clearnce Sorrel, PA-C  docusate sodium (COLACE) 100 MG capsule Take 1 capsule (100 mg total) by mouth 2 (two) times daily as needed for mild constipation. Patient taking differently: Take 100 mg by mouth daily as needed for mild constipation.  09/20/17   Nicholes Mango, MD  doxycycline (VIBRAMYCIN) 100 MG capsule Take 1 capsule (100 mg total) by mouth 2 (two) times daily. 11/25/18   Kris Hartmann, NP  fluticasone (FLONASE) 50 MCG/ACT nasal spray Place 2 sprays into both nostrils daily as needed for allergies or rhinitis. 09/28/17   [name], PA-C  glucose blood (ACCU-CHEK GUIDE) test strip To check blood sugar before meals and bedtime 04/14/18   [name], PA-C  hydrALAZINE (APRESOLINE) 100 MG tablet TAKE (1) TABLET BY MOUTH THREE TIMES DAILY Patient taking differently: Take 100 mg by mouth 3 (three) times daily.  07/25/18   [name], PA-C  HYDROmorphone (DILAUDID) 2 MG tablet Take 1 tablet (2 mg total) by mouth every 12 (twelve) hours as needed for up to 3 days for severe pain. 12/18/18 12/21/18  Carrie Mew, MD  hydrOXYzine (ATARAX/VISTARIL) 25 MG tablet Take 1 tablet (25 mg total) by mouth every 6 (six) hours as needed for itching. 12/07/18   [name], PA-C  Lancet Devices (ACCU-CHEK [facility]) lancets Use as instructed daily. 04/14/17   Charlott Rakes, MD  LANTUS SOLOSTAR 100 UNIT/ML Solostar Pen INJECT 60 UNITS S.Q. TWICE DAILY. 11/18/18   [name], PA-C  liraglutide (VICTOZA) 18 MG/3ML SOPN Start with 0.78m daily and increase by 0.627mper week until max dose of 1.8 mg dose achieved. Patient taking differently: Inject 1.2 mg into the skin every morning. Start with 0.78m91maily and increase by 0.78mg24mer week until max dose of 1.8 mg dose achieved. 10/20/18   BurnMar Daring-C  loratadine (CLARITIN) 10 MG tablet TAKE 1 TABLET BY MOUTH ONCE DAILY. Patient taking differently: Take 10 mg by mouth every morning.  09/20/18   BurnMar Daring-C  mometasone (ELOCON) 0.1 % cream APPLY AS DIRECTED TO AFFECTED AREA ONCE DAILY. 12/08/18   BurnMar Daring-C  mometasone-formoterol (DULERA) 200-5 MCG/ACT AERO Inhale 2 puffs into the lungs 2 (two) times daily. Patient taking differently: Inhale 2 puffs into the lungs 2 (two) times daily as needed  for wheezing or shortness of breath.  08/03/18   [name], PA-C  montelukast (SINGULAIR) 10 MG tablet TAKE ONE TABLET BY MOUTH AT BEDTIME. 11/18/18   [name], PA-C  multivitamin (RENA-VIT) TABS tablet Take 1 tablet by mouth daily.    [provider]  nitroGLYCERIN (NITROSTAT) 0.4 MG SL tablet Place 1 tablet (0.4 mg total) under the tongue every 5 (five) minutes x 3 doses as needed for chest pain. 03/19/17   Barrett, Rhonda G, PA-C  NOVOLOG FLEXPEN 100 UNIT/ML FlexPen INJECT 25 UNITS S.Q. THREE TIMES A DAY WITH MEALS. Patient taking differently: Inject 30 Units into the skin 3 (three) times daily with meals.  10/20/18   [name], PA-C  omeprazole (PRILOSEC) 20 MG capsule TAKE (1) CAPSULE BY MOUTH ONCE DAILY. Patient taking differently: Take 20 mg by mouth every morning.  09/20/18   [name], PA-C  pregabalin (LYRICA) 75 MG capsule Take 1 capsule (75 mg total) by mouth 2 (two) times daily. 07/25/18   [name], PA-C  SENNA PLUS 8.6-50 MG tablet TAKE ONE TABLET BY MOUTH AT BEDTIME. 09/20/18   [name], PA-C  silver sulfADIAZINE (SILVADENE) 1 % cream Apply 1 application topically daily. Patient taking differently: Apply 1 application topically daily as needed (sore).  09/09/18   Kris Hartmann, NP  torsemide (DEMADEX) 20 MG tablet TAKE (2) TABLETS BY MOUTH TWICE DAILY. Patient  taking differently: Take 40 mg by mouth 2 (two) times daily.  07/25/18   [name], PA-C  traMADol (ULTRAM) 50 MG tablet One to Two Tabs Every Six Hours as Needed For Pain Patient taking differently: Take 50-100 mg by mouth every 6 (six) hours as needed (pain). One to Two Tabs Every Six Hours as Needed For Pain 08/30/18   Stegmayer, Joelene Millin A, PA-C  triamcinolone cream (KENALOG) 0.1 % APPLY TO AFFECTED AREA TWICE DAILY Patient taking differently: Apply 1 application topically daily as needed (itching).  06/17/18   [name], PA-C  ULTICARE SHORT PEN NEEDLES 31G X 8 MM MISC USE THREE TIMES DAILY WITH INSULIN 12/02/18   [name], PA-C  valACYclovir (VALTREX) 500 MG tablet TAKE (1) TABLET BY MOUTH EVERY OTHER DAY. 11/18/18   [name], PA-C  Vitamin D, Ergocalciferol, (DRISDOL) 50000 units CAPS capsule TAKE 1 CAPSULE BY MOUTH ONCE A MONTH Patient taking differently: Take 50,000 Units by mouth every 30 (thirty) days.  03/08/18   [name], PA-C  gabapentin (NEURONTIN) 100 MG capsule Take 1 capsule (100 mg total) by mouth 3 (three) times daily. 08/19/17 09/01/17  [name], MD     Allergies Shellfish allergy; Diazepam; and Morphine and related   Family History  Problem Relation Age of Onset  . Colon cancer Mother   . Heart attack Other   . Heart attack Maternal Grandmother   . Hypertension Sister   . Hypertension Brother   . Diabetes Paternal Grandmother   . Breast cancer Neg Hx     Social History Social History   Tobacco Use  . Smoking status: Former Smoker    Packs/day: 0.25    Years: 6.00    Pack years: 1.50    Types: Cigarettes    Last attempt to quit: 10/25/1980    Years since quitting: 38.1  . Smokeless tobacco: Never Used  Substance Use Topics  . Alcohol use: Yes    Comment: rarely  . Drug use: Not Currently    Types: "Crack"  cocaine, Marijuana    Comment: 08/21/2016 "clean since 05/1998"    Review of Systems   Constitutional:   No fever or chills.  ENT:   No sore throat. No rhinorrhea. Cardiovascular:   No chest pain or syncope. Respiratory:   No dyspnea or cough. Gastrointestinal:   Negative for abdominal pain, vomiting and diarrhea.  Musculoskeletal:   Left upper extremity pain and swelling as above All other systems reviewed and are negative except as documented above in ROS and HPI.  ____________________________________________   PHYSICAL EXAM:  VITAL SIGNS: ED Triage Vitals  Enc Vitals Group     BP 12/18/18 1741 (!) 153/60     Pulse Rate 12/18/18 1741 81     Resp --      Temp 12/18/18 1741 98.9 F (37.2 C)     Temp Source 12/18/18 1741 Oral     SpO2 12/18/18 1741 96 %     Weight --      Height --      Head Circumference --      Peak Flow --      Pain Score 12/18/18 1738 10     Pain Loc --      Pain Edu? --      Excl. in [place]? --     Vital signs reviewed, nursing assessments reviewed.   Constitutional:   Alert and oriented. Non-toxic appearance. Eyes:   Conjunctivae are normal. EOMI. PERRL. ENT      Head:   Normocephalic and atraumatic.      Nose:   No congestion/rhinnorhea.       Mouth/Throat:   MMM, no pharyngeal erythema. No peritonsillar mass.       Neck:   No meningismus. Full ROM. Hematological/Lymphatic/Immunilogical:   No cervical lymphadenopathy. Cardiovascular:   RRR.  Diminished left radial pulse.  Left upper extremity AV fistula has a strong pulse, bruit, no palpable thrill. Respiratory:   Normal respiratory effort without tachypnea/retractions. Breath sounds are clear and equal bilaterally. No wheezes/rales/rhonchi. Gastrointestinal:   Soft and nontender. Non distended. There is no CVA tenderness.  No rebound, rigidity, or guarding.  Musculoskeletal:   Normal range of motion in all extremities. No joint effusions.  No lower extremity tenderness.  No edema.  Left upper extremity has large dehisced surgical wound on the upper arm with exposed subcutaneous fat.   Hemostatic.  No purulent drainage.  There is surrounding cellulitis and some necrotic tissue in the wound edges.       Neurologic:   Normal speech and language.  Motor grossly intact. No acute focal neurologic deficits are appreciated.  Skin:    Skin is warm, dry with infected wound on left upper extremity as described above.    No petechiae, purpura, or bullae.  ____________________________________________    LABS (pertinent positives/negatives) (all labs ordered are listed, but only abnormal results are displayed) Labs Reviewed  BASIC METABOLIC PANEL - Abnormal; Notable for the following components:      Result Value   Chloride 97 (*)    Glucose, Bld 194 (*)    BUN 37 (*)    Creatinine, Ser 5.73 (*)    Calcium 8.1 (*)    GFR calc non Af Amer 7 (*)    GFR calc Af Amer 9 (*)    All other components within normal limits  CBC WITH DIFFERENTIAL/PLATELET - Abnormal; Notable for the following components:   RBC 3.53 (*)    Hemoglobin 10.4 (*)    HCT 33.5 (*)  RDW 16.9 (*)    Abs Immature Granulocytes 0.13 (*)    All other components within normal limits   ____________________________________________   EKG    ____________________________________________    RADIOLOGY  No results found.  ____________________________________________   PROCEDURES Procedures  ____________________________________________    CLINICAL IMPRESSION / ASSESSMENT AND PLAN / ED COURSE  Medications ordered in the ED: Medications  morphine 4 MG/ML injection 4 mg (4 mg Intravenous Given 12/18/18 1827)  ondansetron (ZOFRAN) injection 4 mg (4 mg Intravenous Given 12/18/18 1826)  HYDROmorphone (DILAUDID) injection 1 mg (1 mg Intravenous Given 12/18/18 1924)    Pertinent labs & imaging results that were available during my care of the patient were reviewed by me and considered in my medical decision making (see chart for details).  [name] was evaluated in Emergency Department on  12/18/2018 for the symptoms described in the history of present illness. She was evaluated in the context of the global COVID-19 pandemic, which necessitated consideration that the patient might be at risk for infection with the SARS-CoV-2 virus that causes COVID-19. Institutional protocols and algorithms that pertain to the evaluation of patients at risk for COVID-19 are in a state of rapid change based on information released by regulatory bodies including the CDC and federal and state organizations. These policies and algorithms were followed during the patient's care in the ED.   Patient presents with infected dehisced surgical wound overlying her AV fistula.  She was seen by vascular surgery 2 days ago for the same.  She has been on antibiotics through dialysis who she reports also drew cultures.  She is nontoxic appearing, vital signs unremarkable.  Will check labs to look at her white count, give morphine for pain control.  The wound is hemostatic.  Discussed with vascular surgery Dr. Juanda Crumble, including the necrotic wound edges and absent thrill who advises that they continue to follow-up plan with her vascular surgery team is sufficient for now, unless she has a high white blood cell count or other signs of potentially evolving sepsis.  Clinical Course as of Dec 17 2025  Nancy Fetter Dec 18, 2018  1922 Labs unremarkable.  Still having 8/10 pain after 4 mg of IV morphine.  I will give Dilaudid 1 mg IV for greater pain control.   [PS]  2018 Pain adequately controlled.  I will prescribe Dilaudid tablets to control her pain until she can follow-up in get definitive management by surgery.   [PS]    Clinical Course User Index [PS] Carrie Mew, MD     ____________________________________________   FINAL CLINICAL IMPRESSION(S) / ED DIAGNOSES    Final diagnoses:  Surgical wound infection  End-stage renal disease on hemodialysis [facility])     ED Discharge Orders         Ordered    HYDROmorphone  (DILAUDID) 2 MG tablet  Every 12 hours PRN     12/18/18 2021          Portions of this note were generated with dragon dictation software. Dictation errors may occur despite best attempts at proofreading.   Carrie Mew, MD 12/18/18 2027

## 2018-12-18 NOTE — ED Notes (Signed)
Dr Joni Fears available and gave verbal order for Dilaudid for pain

## 2018-12-18 NOTE — ED Notes (Signed)
Answered phone call from pt's family member but was unable to hear her on the phone; into room to let pt know somebody was trying to reach her; pt says the pain in her upper arm is gone except for one area of redness and swelling near elbow; she says that pain is 8/10;

## 2018-12-19 ENCOUNTER — Other Ambulatory Visit (INDEPENDENT_AMBULATORY_CARE_PROVIDER_SITE_OTHER): Payer: Self-pay | Admitting: Nurse Practitioner

## 2018-12-19 ENCOUNTER — Telehealth (INDEPENDENT_AMBULATORY_CARE_PROVIDER_SITE_OTHER): Payer: Self-pay

## 2018-12-19 NOTE — Telephone Encounter (Signed)
Spoke with the patient and gave her the information for her surgery with Dr. Delana Meyer on Wednesday. Patient will call same day surgery on Tuesday for her arrival time. Patient is scheduled for Covid testing and pre-op on 12/20/2018. Patient understood all information.

## 2018-12-20 ENCOUNTER — Other Ambulatory Visit: Payer: Self-pay

## 2018-12-20 ENCOUNTER — Telehealth: Payer: Self-pay | Admitting: Physician Assistant

## 2018-12-20 ENCOUNTER — Encounter
Admission: RE | Admit: 2018-12-20 | Discharge: 2018-12-20 | Disposition: A | Discharge status: Home / Self Care | Payer: Medicare Other | Source: Ambulatory Visit | Attending: Vascular Surgery | Admitting: Vascular Surgery

## 2018-12-20 DIAGNOSIS — Y832 Surgical operation with anastomosis, bypass or graft as the cause of abnormal reaction of the patient, or of later complication, without mention of misadventure at the time of the procedure: Secondary | ICD-10-CM | POA: Diagnosis not present

## 2018-12-20 DIAGNOSIS — Z7982 Long term (current) use of aspirin: Secondary | ICD-10-CM | POA: Diagnosis not present

## 2018-12-20 DIAGNOSIS — L7634 Postprocedural seroma of skin and subcutaneous tissue following other procedure: Secondary | ICD-10-CM | POA: Diagnosis not present

## 2018-12-20 DIAGNOSIS — Z1159 Encounter for screening for other viral diseases: Secondary | ICD-10-CM | POA: Diagnosis not present

## 2018-12-20 DIAGNOSIS — S51832A Puncture wound without foreign body of left forearm, initial encounter: Secondary | ICD-10-CM | POA: Diagnosis not present

## 2018-12-20 DIAGNOSIS — N2581 Secondary hyperparathyroidism of renal origin: Secondary | ICD-10-CM | POA: Diagnosis not present

## 2018-12-20 DIAGNOSIS — IMO0002 Reserved for concepts with insufficient information to code with codable children: Secondary | ICD-10-CM

## 2018-12-20 DIAGNOSIS — K219 Gastro-esophageal reflux disease without esophagitis: Secondary | ICD-10-CM | POA: Diagnosis not present

## 2018-12-20 DIAGNOSIS — N186 End stage renal disease: Secondary | ICD-10-CM | POA: Diagnosis not present

## 2018-12-20 DIAGNOSIS — T8130XA Disruption of wound, unspecified, initial encounter: Secondary | ICD-10-CM | POA: Diagnosis not present

## 2018-12-20 DIAGNOSIS — D509 Iron deficiency anemia, unspecified: Secondary | ICD-10-CM | POA: Diagnosis not present

## 2018-12-20 DIAGNOSIS — E1122 Type 2 diabetes mellitus with diabetic chronic kidney disease: Secondary | ICD-10-CM | POA: Diagnosis not present

## 2018-12-20 DIAGNOSIS — Z955 Presence of coronary angioplasty implant and graft: Secondary | ICD-10-CM | POA: Diagnosis not present

## 2018-12-20 DIAGNOSIS — Z7951 Long term (current) use of inhaled steroids: Secondary | ICD-10-CM | POA: Diagnosis not present

## 2018-12-20 DIAGNOSIS — D631 Anemia in chronic kidney disease: Secondary | ICD-10-CM | POA: Diagnosis not present

## 2018-12-20 DIAGNOSIS — I5042 Chronic combined systolic (congestive) and diastolic (congestive) heart failure: Secondary | ICD-10-CM | POA: Diagnosis not present

## 2018-12-20 DIAGNOSIS — Z6841 Body Mass Index (BMI) 40.0 and over, adult: Secondary | ICD-10-CM | POA: Diagnosis not present

## 2018-12-20 DIAGNOSIS — Z01810 Encounter for preprocedural cardiovascular examination: Secondary | ICD-10-CM | POA: Diagnosis not present

## 2018-12-20 DIAGNOSIS — E669 Obesity, unspecified: Secondary | ICD-10-CM | POA: Diagnosis not present

## 2018-12-20 DIAGNOSIS — I2510 Atherosclerotic heart disease of native coronary artery without angina pectoris: Secondary | ICD-10-CM | POA: Diagnosis not present

## 2018-12-20 DIAGNOSIS — Z794 Long term (current) use of insulin: Secondary | ICD-10-CM | POA: Diagnosis not present

## 2018-12-20 DIAGNOSIS — E1165 Type 2 diabetes mellitus with hyperglycemia: Secondary | ICD-10-CM

## 2018-12-20 DIAGNOSIS — S41102A Unspecified open wound of left upper arm, initial encounter: Secondary | ICD-10-CM | POA: Diagnosis not present

## 2018-12-20 DIAGNOSIS — N184 Chronic kidney disease, stage 4 (severe): Secondary | ICD-10-CM | POA: Diagnosis not present

## 2018-12-20 DIAGNOSIS — I252 Old myocardial infarction: Secondary | ICD-10-CM | POA: Diagnosis not present

## 2018-12-20 DIAGNOSIS — M109 Gout, unspecified: Secondary | ICD-10-CM | POA: Diagnosis not present

## 2018-12-20 DIAGNOSIS — Z992 Dependence on renal dialysis: Secondary | ICD-10-CM | POA: Diagnosis not present

## 2018-12-20 DIAGNOSIS — Z79899 Other long term (current) drug therapy: Secondary | ICD-10-CM | POA: Diagnosis not present

## 2018-12-20 DIAGNOSIS — I132 Hypertensive heart and chronic kidney disease with heart failure and with stage 5 chronic kidney disease, or end stage renal disease: Secondary | ICD-10-CM | POA: Diagnosis not present

## 2018-12-20 HISTORY — DX: Infection following a procedure, other surgical site, initial encounter: T81.49XA

## 2018-12-20 HISTORY — DX: Hyperlipidemia, unspecified: E78.5

## 2018-12-20 LAB — CBC WITH DIFFERENTIAL/PLATELET
Abs Immature Granulocytes: 0.13 10*3/uL — ABNORMAL HIGH (ref 0.00–0.07)
Basophils Absolute: 0 10*3/uL (ref 0.0–0.1)
Basophils Relative: 0 %
Eosinophils Absolute: 0.2 10*3/uL (ref 0.0–0.5)
Eosinophils Relative: 2 %
HCT: 37 % (ref 36.0–46.0)
Hemoglobin: 11.4 g/dL — ABNORMAL LOW (ref 12.0–15.0)
Immature Granulocytes: 1 %
Lymphocytes Relative: 14 %
Lymphs Abs: 1.3 10*3/uL (ref 0.7–4.0)
MCH: 29.8 pg (ref 26.0–34.0)
MCHC: 30.8 g/dL (ref 30.0–36.0)
MCV: 96.6 fL (ref 80.0–100.0)
Monocytes Absolute: 0.7 10*3/uL (ref 0.1–1.0)
Monocytes Relative: 7 %
Neutro Abs: 7.1 10*3/uL (ref 1.7–7.7)
Neutrophils Relative %: 76 %
Platelets: 209 10*3/uL (ref 150–400)
RBC: 3.83 MIL/uL — ABNORMAL LOW (ref 3.87–5.11)
RDW: 16.6 % — ABNORMAL HIGH (ref 11.5–15.5)
WBC: 9.5 10*3/uL (ref 4.0–10.5)
nRBC: 0 % (ref 0.0–0.2)

## 2018-12-20 LAB — BASIC METABOLIC PANEL
Anion gap: 13 (ref 5–15)
BUN: 22 mg/dL — ABNORMAL HIGH (ref 6–20)
CO2: 27 mmol/L (ref 22–32)
Calcium: 8.1 mg/dL — ABNORMAL LOW (ref 8.9–10.3)
Chloride: 95 mmol/L — ABNORMAL LOW (ref 98–111)
Creatinine, Ser: 4.17 mg/dL — ABNORMAL HIGH (ref 0.44–1.00)
GFR calc Af Amer: 13 mL/min — ABNORMAL LOW (ref >60)
GFR calc non Af Amer: 11 mL/min — ABNORMAL LOW (ref >60)
Glucose, Bld: 266 mg/dL — ABNORMAL HIGH (ref 70–99)
Potassium: 4.2 mmol/L (ref 3.5–5.1)
Sodium: 135 mmol/L (ref 135–145)

## 2018-12-20 LAB — TYPE AND SCREEN
ABO/RH(D): A POS
Antibody Screen: NEGATIVE

## 2018-12-20 LAB — PROTIME-INR
INR: 1 (ref 0.8–1.2)
Prothrombin Time: 13.2 seconds (ref 11.4–15.2)

## 2018-12-20 LAB — SARS CORONAVIRUS 2 BY RT PCR (HOSPITAL ORDER, PERFORMED IN ~~LOC~~ HOSPITAL LAB): SARS Coronavirus 2: NEGATIVE

## 2018-12-20 LAB — APTT: aPTT: 29 seconds (ref 24–36)

## 2018-12-20 MED ORDER — CHLORHEXIDINE GLUCONATE CLOTH 2 % EX PADS
6.0000 | MEDICATED_PAD | Freq: Once | CUTANEOUS | Status: DC
  Filled 2018-12-20: qty 6

## 2018-12-20 MED ORDER — CEFAZOLIN SODIUM-DEXTROSE 1-4 GM/50ML-% IV SOLN
1.0000 g | INTRAVENOUS | Status: AC
  Administered 2018-12-21: 1 g via INTRAVENOUS

## 2018-12-20 NOTE — Telephone Encounter (Signed)
Dam with [employer] called saying the [employer] is no longer on the formulary for that patients insurance.  He wants to know if you can give a substitute  [phone]  Thanks teri

## 2018-12-20 NOTE — Patient Instructions (Addendum)
Your procedure is scheduled on: 12/21/2018 Wed at 12:00 Noon Report to Same Day Surgery 2nd floor medical mall [facility] Entrance-take elevator on left to 2nd floor.  Check in with surgery information desk.)   Remember: Instructions that are not followed completely may result in serious medical risk, up to and including death, or upon the discretion of your surgeon and anesthesiologist your surgery may need to be rescheduled.    _x___ 1. Do not eat food after midnight the night before your procedure. You may drink clear liquids up to 2 hours before you are scheduled to arrive at the hospital for your procedure.  Do not drink clear liquids within 2 hours of your scheduled arrival to the hospital.  Clear liquids include  --Water or Apple juice without pulp  --Clear carbohydrate beverage such as ClearFast or Gatorade  --Black Coffee or Clear Tea (No milk, no creamers, do not add anything to                  the coffee or Tea Type 1 and type 2 diabetics should only drink water.   ____Ensure clear carbohydrate drink on the way to the hospital for bariatric patients  ____Ensure clear carbohydrate drink 3 hours before surgery for Dr Dwyane Luo patients if physician instructed.   No gum chewing or hard candies.     __x__ 2. No Alcohol for 24 hours before or after surgery.   __x__3. No Smoking or e-cigarettes for 24 prior to surgery.  Do not use any chewable tobacco products for at least 6 hour prior to surgery   ____  4. Bring all medications with you on the day of surgery if instructed.    __x__ 5. Notify your doctor if there is any change in your medical condition     (cold, fever, infections).    x___6. On the morning of surgery brush your teeth with toothpaste and water.  You may rinse your mouth with mouth wash if you wish.  Do not swallow any toothpaste or mouthwash.   Do not wear jewelry, make-up, hairpins, clips or nail polish.  Do not wear lotions, powders, or perfumes. You may  wear deodorant.  Do not shave 48 hours prior to surgery. Men may shave face and neck.  Do not bring valuables to the hospital.    [facility] is not responsible for any belongings or valuables.               Contacts, dentures or bridgework may not be worn into surgery.  Leave your suitcase in the car. After surgery it may be brought to your room.  For patients admitted to the hospital, discharge time is determined by your                       treatment team.  _  Patients discharged the day of surgery will not be allowed to drive home.  You will need someone to drive you home and stay with you the night of your procedure.    Please read over the following fact sheets that you were given:   [facility] Preparing for Surgery and or MRSA Information   _x___ Take anti-hypertensive listed below, cardiac, seizure, asthma,     anti-reflux and psychiatric medicines. These include:  1. albuterol (PROAIR HFA)   2.carvedilol (COREG) 25 MG tablet  3.omeprazole (PRILOSEC) 20 MG capsule  4.hydrALAZINE (APRESOLINE) 100 MG tablet  5.  6.  ____Fleets enema or Magnesium Citrate  as directed.   _x___ Use CHG Soap or sage wipes as directed on instruction sheet   __x__ Use inhalers on the day of surgery and bring to hospital day of surgery  ____ Stop Metformin and Janumet 2 days prior to surgery.    __x__ Take 1/2 of usual insulin dose the night before surgery and none on the morning     surgery.   _x___ Follow recommendations from Cardiologist, Pulmonologist or PCP regarding          stopping Aspirin, Coumadin, Plavix ,Eliquis, Effient, or Pradaxa, and Pletal.  X____Stop Anti-inflammatories such as Advil, Aleve, Ibuprofen, Motrin, Naproxen, Naprosyn, Goodies powders or aspirin products. OK to take Tylenol and                          Celebrex.   _x___ Stop supplements until after surgery.  But may continue Vitamin D, Vitamin B,       and multivitamin.   ____ Bring C-Pap to the hospital.

## 2018-12-21 ENCOUNTER — Encounter
Admission: RE | Disposition: A | Discharge status: Home / Self Care | Payer: Self-pay | Source: Home / Self Care | Attending: Vascular Surgery

## 2018-12-21 ENCOUNTER — Ambulatory Visit
Admission: RE | Admit: 2018-12-21 | Discharge: 2018-12-21 | Disposition: A | Discharge status: Home / Self Care | Payer: Medicare Other | Attending: Vascular Surgery | Admitting: Vascular Surgery

## 2018-12-21 ENCOUNTER — Ambulatory Visit: Payer: Medicare Other | Admitting: Anesthesiology

## 2018-12-21 ENCOUNTER — Other Ambulatory Visit: Payer: Self-pay

## 2018-12-21 DIAGNOSIS — Z992 Dependence on renal dialysis: Secondary | ICD-10-CM | POA: Insufficient documentation

## 2018-12-21 DIAGNOSIS — L7634 Postprocedural seroma of skin and subcutaneous tissue following other procedure: Secondary | ICD-10-CM | POA: Diagnosis not present

## 2018-12-21 DIAGNOSIS — Z7982 Long term (current) use of aspirin: Secondary | ICD-10-CM | POA: Insufficient documentation

## 2018-12-21 DIAGNOSIS — T8130XA Disruption of wound, unspecified, initial encounter: Secondary | ICD-10-CM | POA: Insufficient documentation

## 2018-12-21 DIAGNOSIS — E1122 Type 2 diabetes mellitus with diabetic chronic kidney disease: Secondary | ICD-10-CM | POA: Diagnosis not present

## 2018-12-21 DIAGNOSIS — Z79899 Other long term (current) drug therapy: Secondary | ICD-10-CM | POA: Insufficient documentation

## 2018-12-21 DIAGNOSIS — N186 End stage renal disease: Secondary | ICD-10-CM | POA: Insufficient documentation

## 2018-12-21 DIAGNOSIS — Z6841 Body Mass Index (BMI) 40.0 and over, adult: Secondary | ICD-10-CM | POA: Insufficient documentation

## 2018-12-21 DIAGNOSIS — I130 Hypertensive heart and chronic kidney disease with heart failure and stage 1 through stage 4 chronic kidney disease, or unspecified chronic kidney disease: Secondary | ICD-10-CM | POA: Diagnosis not present

## 2018-12-21 DIAGNOSIS — G4733 Obstructive sleep apnea (adult) (pediatric): Secondary | ICD-10-CM | POA: Diagnosis not present

## 2018-12-21 DIAGNOSIS — Z1159 Encounter for screening for other viral diseases: Secondary | ICD-10-CM | POA: Insufficient documentation

## 2018-12-21 DIAGNOSIS — Z955 Presence of coronary angioplasty implant and graft: Secondary | ICD-10-CM | POA: Insufficient documentation

## 2018-12-21 DIAGNOSIS — Z7951 Long term (current) use of inhaled steroids: Secondary | ICD-10-CM | POA: Insufficient documentation

## 2018-12-21 DIAGNOSIS — K219 Gastro-esophageal reflux disease without esophagitis: Secondary | ICD-10-CM | POA: Insufficient documentation

## 2018-12-21 DIAGNOSIS — M109 Gout, unspecified: Secondary | ICD-10-CM | POA: Insufficient documentation

## 2018-12-21 DIAGNOSIS — I5042 Chronic combined systolic (congestive) and diastolic (congestive) heart failure: Secondary | ICD-10-CM | POA: Insufficient documentation

## 2018-12-21 DIAGNOSIS — T8131XA Disruption of external operation (surgical) wound, not elsewhere classified, initial encounter: Secondary | ICD-10-CM | POA: Diagnosis not present

## 2018-12-21 DIAGNOSIS — I132 Hypertensive heart and chronic kidney disease with heart failure and with stage 5 chronic kidney disease, or end stage renal disease: Secondary | ICD-10-CM | POA: Diagnosis not present

## 2018-12-21 DIAGNOSIS — Z794 Long term (current) use of insulin: Secondary | ICD-10-CM | POA: Insufficient documentation

## 2018-12-21 DIAGNOSIS — I5032 Chronic diastolic (congestive) heart failure: Secondary | ICD-10-CM | POA: Diagnosis not present

## 2018-12-21 DIAGNOSIS — I96 Gangrene, not elsewhere classified: Secondary | ICD-10-CM | POA: Diagnosis not present

## 2018-12-21 DIAGNOSIS — I2510 Atherosclerotic heart disease of native coronary artery without angina pectoris: Secondary | ICD-10-CM | POA: Insufficient documentation

## 2018-12-21 DIAGNOSIS — E669 Obesity, unspecified: Secondary | ICD-10-CM | POA: Insufficient documentation

## 2018-12-21 DIAGNOSIS — I252 Old myocardial infarction: Secondary | ICD-10-CM | POA: Insufficient documentation

## 2018-12-21 DIAGNOSIS — Y832 Surgical operation with anastomosis, bypass or graft as the cause of abnormal reaction of the patient, or of later complication, without mention of misadventure at the time of the procedure: Secondary | ICD-10-CM | POA: Insufficient documentation

## 2018-12-21 DIAGNOSIS — N184 Chronic kidney disease, stage 4 (severe): Secondary | ICD-10-CM | POA: Diagnosis not present

## 2018-12-21 DIAGNOSIS — T8189XA Other complications of procedures, not elsewhere classified, initial encounter: Secondary | ICD-10-CM | POA: Diagnosis not present

## 2018-12-21 HISTORY — PX: WOUND DEBRIDEMENT: SHX247

## 2018-12-21 HISTORY — PX: APPLICATION OF WOUND VAC: SHX5189

## 2018-12-21 LAB — GLUCOSE, CAPILLARY
Glucose-Capillary: 209 mg/dL — ABNORMAL HIGH (ref 70–99)
Glucose-Capillary: 196 mg/dL — ABNORMAL HIGH (ref 70–99)

## 2018-12-21 LAB — POCT I-STAT 4, (NA,K, GLUC, HGB,HCT)
Glucose, Bld: 246 mg/dL — ABNORMAL HIGH (ref 70–99)
HCT: 36 % (ref 36.0–46.0)
Hemoglobin: 12.2 g/dL (ref 12.0–15.0)
Potassium: 4 mmol/L (ref 3.5–5.1)
Sodium: 134 mmol/L — ABNORMAL LOW (ref 135–145)

## 2018-12-21 SURGERY — DEBRIDEMENT, WOUND
Anesthesia: General | Laterality: Left

## 2018-12-21 MED ORDER — PROPOFOL 10 MG/ML IV BOLUS
INTRAVENOUS | Status: AC
  Filled 2018-12-21: qty 20

## 2018-12-21 MED ORDER — PHENYLEPHRINE HCL (PRESSORS) 10 MG/ML IV SOLN
INTRAVENOUS | Status: DC | PRN
  Administered 2018-12-21 (×6): 100 ug via INTRAVENOUS

## 2018-12-21 MED ORDER — MIDAZOLAM HCL 2 MG/2ML IJ SOLN
INTRAMUSCULAR | Status: AC
  Filled 2018-12-21: qty 2

## 2018-12-21 MED ORDER — HYDROMORPHONE HCL 1 MG/ML IJ SOLN
INTRAMUSCULAR | Status: AC
  Administered 2018-12-21: 0.25 mg via INTRAVENOUS
  Filled 2018-12-21: qty 1

## 2018-12-21 MED ORDER — OXYCODONE-ACETAMINOPHEN 5-325 MG PO TABS
ORAL_TABLET | ORAL | Status: AC
  Filled 2018-12-21: qty 2

## 2018-12-21 MED ORDER — HYDROMORPHONE HCL 1 MG/ML IJ SOLN
0.2500 mg | INTRAMUSCULAR | Status: AC | PRN
  Administered 2018-12-21 (×8): 0.25 mg via INTRAVENOUS

## 2018-12-21 MED ORDER — LIDOCAINE HCL (PF) 2 % IJ SOLN
INTRAMUSCULAR | Status: AC
  Filled 2018-12-21: qty 10

## 2018-12-21 MED ORDER — BUPIVACAINE LIPOSOME 1.3 % IJ SUSP
INTRAMUSCULAR | Status: AC
  Filled 2018-12-21: qty 20

## 2018-12-21 MED ORDER — OXYCODONE-ACETAMINOPHEN 5-325 MG PO TABS
2.0000 | ORAL_TABLET | Freq: Once | ORAL | Status: AC
  Administered 2018-12-21: 2 via ORAL

## 2018-12-21 MED ORDER — FENTANYL CITRATE (PF) 100 MCG/2ML IJ SOLN
INTRAMUSCULAR | Status: AC
  Filled 2018-12-21: qty 2

## 2018-12-21 MED ORDER — MIDAZOLAM HCL 2 MG/2ML IJ SOLN
INTRAMUSCULAR | Status: DC | PRN
  Administered 2018-12-21: 1 mg via INTRAVENOUS

## 2018-12-21 MED ORDER — ROCURONIUM BROMIDE 50 MG/5ML IV SOLN
INTRAVENOUS | Status: AC
  Filled 2018-12-21: qty 1

## 2018-12-21 MED ORDER — LIDOCAINE HCL (CARDIAC) PF 100 MG/5ML IV SOSY
PREFILLED_SYRINGE | INTRAVENOUS | Status: DC | PRN
  Administered 2018-12-21: 100 mg via INTRAVENOUS

## 2018-12-21 MED ORDER — OXYCODONE HCL 5 MG PO TABS: 5.0000 mg | ORAL_TABLET | Freq: Four times a day (QID) | ORAL | 0 refills | Status: DC | PRN

## 2018-12-21 MED ORDER — ONDANSETRON HCL 4 MG/2ML IJ SOLN: 4.0000 mg | Freq: Four times a day (QID) | INTRAMUSCULAR | Status: DC | PRN

## 2018-12-21 MED ORDER — SODIUM CHLORIDE 0.9 % IV SOLN
INTRAVENOUS | Status: DC
  Administered 2018-12-21: 12:00:00 via INTRAVENOUS

## 2018-12-21 MED ORDER — PROPOFOL 10 MG/ML IV BOLUS
INTRAVENOUS | Status: DC | PRN
  Administered 2018-12-21: 100 mg via INTRAVENOUS

## 2018-12-21 MED ORDER — FENTANYL CITRATE (PF) 100 MCG/2ML IJ SOLN
INTRAMUSCULAR | Status: DC | PRN
  Administered 2018-12-21: 50 ug via INTRAVENOUS

## 2018-12-21 MED ORDER — SUCCINYLCHOLINE CHLORIDE 20 MG/ML IJ SOLN
INTRAMUSCULAR | Status: AC
  Filled 2018-12-21: qty 1

## 2018-12-21 MED ORDER — BUPIVACAINE LIPOSOME 1.3 % IJ SUSP
INTRAMUSCULAR | Status: DC | PRN
  Administered 2018-12-21: 20 mL

## 2018-12-21 MED ORDER — INSULIN DETEMIR 100 UNIT/ML FLEXPEN: 60.0000 [IU] | PEN_INJECTOR | Freq: Two times a day (BID) | SUBCUTANEOUS | 11 refills | Status: DC

## 2018-12-21 SURGICAL SUPPLY — 28 items
APL PRP STRL LF DISP 70% ISPRP (MISCELLANEOUS)
BNDG COHESIVE 6X5 TAN STRL LF (GAUZE/BANDAGES/DRESSINGS) ×2 IMPLANT
BNDG CONFORM 2 STRL LF (GAUZE/BANDAGES/DRESSINGS) IMPLANT
CANISTER SUCT 1200ML W/VALVE (MISCELLANEOUS) ×2 IMPLANT
CANISTER WOUND CARE 500ML ATS (WOUND CARE) ×1 IMPLANT
CHLORAPREP W/TINT 26 (MISCELLANEOUS) ×1 IMPLANT
COVER WAND RF STERILE (DRAPES) ×1 IMPLANT
DRAPE INCISE IOBAN 66X45 STRL (DRAPES) ×1 IMPLANT
DRSG EMULSION OIL 3X3 NADH (GAUZE/BANDAGES/DRESSINGS) ×1 IMPLANT
DRSG MEPITEL 4X7.2 (GAUZE/BANDAGES/DRESSINGS) ×2 IMPLANT
DRSG VAC ATS MED SENSATRAC (GAUZE/BANDAGES/DRESSINGS) ×1 IMPLANT
ELECT REM PT RETURN 9FT ADLT (ELECTROSURGICAL) ×2
ELECTRODE REM PT RTRN 9FT ADLT (ELECTROSURGICAL) ×1 IMPLANT
GAUZE SPONGE 4X4 12PLY STRL (GAUZE/BANDAGES/DRESSINGS) IMPLANT
GLOVE SURG SYN 8.0 (GLOVE) ×2 IMPLANT
GLOVE SURG SYN 8.0 PF PI (GLOVE) ×1 IMPLANT
GOWN STRL REUS W/ TWL LRG LVL3 (GOWN DISPOSABLE) ×1 IMPLANT
GOWN STRL REUS W/ TWL XL LVL3 (GOWN DISPOSABLE) ×1 IMPLANT
GOWN STRL REUS W/TWL LRG LVL3 (GOWN DISPOSABLE) ×2
GOWN STRL REUS W/TWL XL LVL3 (GOWN DISPOSABLE) ×2
KIT TURNOVER KIT A (KITS) ×2 IMPLANT
LABEL OR SOLS (LABEL) ×2 IMPLANT
NS IRRIG 500ML POUR BTL (IV SOLUTION) ×2 IMPLANT
PACK EXTREMITY ARMC (MISCELLANEOUS) ×2 IMPLANT
PAD PREP 24X41 OB/GYN DISP (PERSONAL CARE ITEMS) ×1 IMPLANT
SOL PREP PVP 2OZ (MISCELLANEOUS) ×2
SOLUTION PREP PVP 2OZ (MISCELLANEOUS) ×1 IMPLANT
STOCKINETTE IMPERV 14X48 (MISCELLANEOUS) ×2 IMPLANT

## 2018-12-21 NOTE — OR Nursing (Signed)
Dr. Delana Meyer in to see pt postop 1430

## 2018-12-21 NOTE — Anesthesia Post-op Follow-up Note (Signed)
Anesthesia QCDR form completed.        

## 2018-12-21 NOTE — Transfer of Care (Signed)
Immediate Anesthesia Transfer of Care Note  Patient: [name]  Procedure(s) Performed: DEBRIDEMENT WOUND (Left ) APPLICATION OF WOUND VAC (Left )  Patient Location: PACU  Anesthesia Type:General  Level of Consciousness: awake, alert  and oriented  Airway & Oxygen Therapy: Patient Spontanous Breathing and Patient connected to face mask oxygen  Post-op Assessment: Report given to RN and Post -op Vital signs reviewed and stable  Post vital signs: Reviewed and stable  Last Vitals:  Vitals Value Taken Time  BP 150/59 12/21/2018  1:06 PM  Temp    Pulse 79 12/21/2018  1:06 PM  Resp 12 12/21/2018  1:06 PM  SpO2 100 % 12/21/2018  1:06 PM    Last Pain:  Vitals:   12/21/18 1306  TempSrc:   PainSc: 10-Worst pain ever         Complications: No apparent anesthesia complications

## 2018-12-21 NOTE — Anesthesia Preprocedure Evaluation (Signed)
Anesthesia Evaluation    History of Anesthesia Complications Negative for: history of anesthetic complications  Airway Mallampati: III  TM Distance: >3 FB     Dental  (+) Partial Upper   Pulmonary asthma , sleep apnea , former smoker,           Cardiovascular hypertension, + CAD, + Past MI, + Cardiac Stents and +CHF       Neuro/Psych PSYCHIATRIC DISORDERS Depression    GI/Hepatic GERD  ,(+) Hepatitis -, C  Endo/Other  diabetes  Renal/GU Dialysis and ESRFRenal disease     Musculoskeletal   Abdominal   Peds  Hematology  (+) Blood dyscrasia, anemia ,   Anesthesia Other Findings   Reproductive/Obstetrics                             Anesthesia Physical Anesthesia Plan  ASA: IV  Anesthesia Plan: General LMA   Post-op Pain Management:    Induction:   PONV Risk Score and Plan:   Airway Management Planned:   Additional Equipment:   Intra-op Plan:   Post-operative Plan:   Informed Consent:   Plan Discussed with:   Anesthesia Plan Comments:         Anesthesia Quick Evaluation

## 2018-12-21 NOTE — Discharge Instructions (Signed)

## 2018-12-21 NOTE — Op Note (Signed)
    OPERATIVE NOTE   PROCEDURE: Excisional debridement with drainage of a seroma left arm  PRE-OPERATIVE DIAGNOSIS: Necrosis of incision left arm basilic transposition  POST-OPERATIVE DIAGNOSIS: Same  SURGEON: Hortencia Pilar  ASSISTANT(S): Ms. Hezzie Bump  ANESTHESIA: general  ESTIMATED BLOOD LOSS: 25 cc  FINDING(S): Necrotic skin and subcutaneous tissues large seroma  SPECIMEN(S): Culture of the deep wound sent to microbiology  INDICATIONS:   [name] is a 63 y.o. female who presents with increasing pain with necrosis of the skin and subcutaneous tissues left arm.  She is status post basilic transposition approximately 1 month ago.  Postoperatively she has had skin necrosis along the entire length of her incision.  Risk and benefits for excisional debridement were reviewed all questions answered patient has agreed to proceed.  DESCRIPTION: After full informed written consent was obtained from the patient, the patient was brought back to the operating room and placed supine upon the operating table.  Prior to induction, the patient received IV antibiotics.   After obtaining adequate anesthesia, the patient was then prepped and draped in the standard fashion for a debridement of her left arm incisional wound.  Using a combination of Metzenbaum scissors, 15 blade scalpel as well as Bovie cautery all of the necrotic skin and subcutaneous soft tissues was debrided.  In the midportion of the wound I entered what proved to be a large seroma.  This extended all the way down to the muscle.  Fibrinous exudate was debrided from the muscle and surrounding tissues.  Deep wound culture was obtained from this area.  Hemostasis was achieved with Bovie cautery.  The wound was irrigated Exparel was then infiltrated into the skin edges and soft tissues.  A wound VAC was then placed with a wedge extending down to the bottom of the seroma cavity and then a second strip along the length of the  incision.  Once and intact dressing had been achieved the VAC was hooked up successfully.  The wound itself measures 23 cm in length 6 cm in width and 8 cm in depth    The patient tolerated this procedure well.   COMPLICATIONS: None  CONDITION: [name] [facility]  Office: [phone]   12/21/2018, 3:15 PM

## 2018-12-21 NOTE — Telephone Encounter (Signed)
Changed to Levemir and sent in electronically

## 2018-12-21 NOTE — H&P (Signed)
[facility] History & Physical Update  The patient was interviewed and re-examined.  The patient's previous History and Physical has been reviewed and is unchanged.  There is no change in the plan of care. We plan to proceed with the scheduled procedure.  Hortencia Pilar, MD  12/21/2018, 11:37 AM

## 2018-12-21 NOTE — Anesthesia Procedure Notes (Signed)
Procedure Name: LMA Insertion Date/Time: 12/21/2018 11:58 AM Performed by: Aline Brochure, CRNA Pre-anesthesia Checklist: Patient identified, Emergency Drugs available, Suction available and Patient being monitored Patient Re-evaluated:Patient Re-evaluated prior to induction Oxygen Delivery Method: Circle system utilized Induction Type: IV induction Ventilation: Mask ventilation without difficulty LMA: LMA inserted LMA Size: 4.0 Number of attempts: 1 Placement Confirmation: positive ETCO2 and breath sounds checked- equal and bilateral Tube secured with: Tape Dental Injury: Teeth and Oropharynx as per pre-operative assessment

## 2018-12-22 ENCOUNTER — Telehealth (INDEPENDENT_AMBULATORY_CARE_PROVIDER_SITE_OTHER): Payer: Self-pay

## 2018-12-22 ENCOUNTER — Encounter: Payer: Self-pay | Admitting: Vascular Surgery

## 2018-12-22 DIAGNOSIS — S51832A Puncture wound without foreign body of left forearm, initial encounter: Secondary | ICD-10-CM | POA: Diagnosis not present

## 2018-12-22 DIAGNOSIS — N2581 Secondary hyperparathyroidism of renal origin: Secondary | ICD-10-CM | POA: Diagnosis not present

## 2018-12-22 DIAGNOSIS — D631 Anemia in chronic kidney disease: Secondary | ICD-10-CM | POA: Diagnosis not present

## 2018-12-22 DIAGNOSIS — D509 Iron deficiency anemia, unspecified: Secondary | ICD-10-CM | POA: Diagnosis not present

## 2018-12-22 DIAGNOSIS — N186 End stage renal disease: Secondary | ICD-10-CM | POA: Diagnosis not present

## 2018-12-22 DIAGNOSIS — S41102A Unspecified open wound of left upper arm, initial encounter: Secondary | ICD-10-CM | POA: Diagnosis not present

## 2018-12-22 NOTE — Telephone Encounter (Signed)
I received a call from Hezzie Bump PA about the patient calling her regarding the wound vac and it not working. Kim advised the patient call the office and for the moment to take the vac off and place gauze. Patient went to dialysis. I called [facility] and Ronnell Gu[place]ara with [facility] regarding this and per Santiago Glad with [facility] I sent over a new order for vac changes and Jeneen Rinks went to the dialysis center to see the patient. Per Jeneen Rinks the vac was still on but was upside down so that caused the canister to register full and stop suctioning. Jeneen Rinks turned the pump right side up and checked everything else, per Jeneen Rinks everything looked good and was running fine.

## 2018-12-22 NOTE — Anesthesia Postprocedure Evaluation (Signed)
Anesthesia Post Note  Patient: [name]  Procedure(s) Performed: DEBRIDEMENT WOUND (Left ) APPLICATION OF WOUND VAC (Left )  Patient location during evaluation: PACU Anesthesia Type: General Level of consciousness: awake and alert Pain management: pain level controlled Vital Signs Assessment: post-procedure vital signs reviewed and stable Respiratory status: spontaneous breathing, nonlabored ventilation and respiratory function stable Cardiovascular status: blood pressure returned to baseline and stable Postop Assessment: no apparent nausea or vomiting Anesthetic complications: no     Last Vitals:  Vitals:   12/21/18 1422 12/21/18 1458  BP: (!) 136/56 (!) 123/51  Pulse: 78 74  Resp: 16 16  Temp: (!) 36.2 C   SpO2: 100% 100%    Last Pain:  Vitals:   12/21/18 1458  TempSrc:   PainSc: [place]

## 2018-12-23 DIAGNOSIS — I132 Hypertensive heart and chronic kidney disease with heart failure and with stage 5 chronic kidney disease, or end stage renal disease: Secondary | ICD-10-CM | POA: Diagnosis not present

## 2018-12-23 DIAGNOSIS — I2510 Atherosclerotic heart disease of native coronary artery without angina pectoris: Secondary | ICD-10-CM | POA: Diagnosis not present

## 2018-12-23 DIAGNOSIS — Z48812 Encounter for surgical aftercare following surgery on the circulatory system: Secondary | ICD-10-CM | POA: Diagnosis not present

## 2018-12-23 DIAGNOSIS — N186 End stage renal disease: Secondary | ICD-10-CM | POA: Diagnosis not present

## 2018-12-23 DIAGNOSIS — E1122 Type 2 diabetes mellitus with diabetic chronic kidney disease: Secondary | ICD-10-CM | POA: Diagnosis not present

## 2018-12-23 DIAGNOSIS — I5042 Chronic combined systolic (congestive) and diastolic (congestive) heart failure: Secondary | ICD-10-CM | POA: Diagnosis not present

## 2018-12-24 DIAGNOSIS — I5042 Chronic combined systolic (congestive) and diastolic (congestive) heart failure: Secondary | ICD-10-CM | POA: Diagnosis not present

## 2018-12-24 DIAGNOSIS — E1122 Type 2 diabetes mellitus with diabetic chronic kidney disease: Secondary | ICD-10-CM | POA: Diagnosis not present

## 2018-12-24 DIAGNOSIS — I2510 Atherosclerotic heart disease of native coronary artery without angina pectoris: Secondary | ICD-10-CM | POA: Diagnosis not present

## 2018-12-24 DIAGNOSIS — S51832A Puncture wound without foreign body of left forearm, initial encounter: Secondary | ICD-10-CM | POA: Diagnosis not present

## 2018-12-24 DIAGNOSIS — Z48812 Encounter for surgical aftercare following surgery on the circulatory system: Secondary | ICD-10-CM | POA: Diagnosis not present

## 2018-12-24 DIAGNOSIS — D509 Iron deficiency anemia, unspecified: Secondary | ICD-10-CM | POA: Diagnosis not present

## 2018-12-24 DIAGNOSIS — S41102A Unspecified open wound of left upper arm, initial encounter: Secondary | ICD-10-CM | POA: Diagnosis not present

## 2018-12-24 DIAGNOSIS — N2581 Secondary hyperparathyroidism of renal origin: Secondary | ICD-10-CM | POA: Diagnosis not present

## 2018-12-24 DIAGNOSIS — I132 Hypertensive heart and chronic kidney disease with heart failure and with stage 5 chronic kidney disease, or end stage renal disease: Secondary | ICD-10-CM | POA: Diagnosis not present

## 2018-12-24 DIAGNOSIS — N186 End stage renal disease: Secondary | ICD-10-CM | POA: Diagnosis not present

## 2018-12-24 DIAGNOSIS — D631 Anemia in chronic kidney disease: Secondary | ICD-10-CM | POA: Diagnosis not present

## 2018-12-26 ENCOUNTER — Encounter (INDEPENDENT_AMBULATORY_CARE_PROVIDER_SITE_OTHER): Payer: Medicare Other

## 2018-12-26 ENCOUNTER — Encounter

## 2018-12-26 ENCOUNTER — Ambulatory Visit (INDEPENDENT_AMBULATORY_CARE_PROVIDER_SITE_OTHER): Payer: Medicare Other | Admitting: Vascular Surgery

## 2018-12-26 DIAGNOSIS — I132 Hypertensive heart and chronic kidney disease with heart failure and with stage 5 chronic kidney disease, or end stage renal disease: Secondary | ICD-10-CM | POA: Diagnosis not present

## 2018-12-26 DIAGNOSIS — I2510 Atherosclerotic heart disease of native coronary artery without angina pectoris: Secondary | ICD-10-CM | POA: Diagnosis not present

## 2018-12-26 DIAGNOSIS — E1122 Type 2 diabetes mellitus with diabetic chronic kidney disease: Secondary | ICD-10-CM | POA: Diagnosis not present

## 2018-12-26 DIAGNOSIS — Z48812 Encounter for surgical aftercare following surgery on the circulatory system: Secondary | ICD-10-CM | POA: Diagnosis not present

## 2018-12-26 DIAGNOSIS — I5042 Chronic combined systolic (congestive) and diastolic (congestive) heart failure: Secondary | ICD-10-CM | POA: Diagnosis not present

## 2018-12-26 DIAGNOSIS — N186 End stage renal disease: Secondary | ICD-10-CM | POA: Diagnosis not present

## 2018-12-26 LAB — AEROBIC/ANAEROBIC CULTURE W GRAM STAIN (SURGICAL/DEEP WOUND)

## 2018-12-27 ENCOUNTER — Telehealth (INDEPENDENT_AMBULATORY_CARE_PROVIDER_SITE_OTHER): Payer: Self-pay | Admitting: Nurse Practitioner

## 2018-12-27 ENCOUNTER — Other Ambulatory Visit (INDEPENDENT_AMBULATORY_CARE_PROVIDER_SITE_OTHER): Payer: Self-pay | Admitting: Nurse Practitioner

## 2018-12-27 DIAGNOSIS — D509 Iron deficiency anemia, unspecified: Secondary | ICD-10-CM | POA: Diagnosis not present

## 2018-12-27 DIAGNOSIS — S51832A Puncture wound without foreign body of left forearm, initial encounter: Secondary | ICD-10-CM | POA: Diagnosis not present

## 2018-12-27 DIAGNOSIS — N186 End stage renal disease: Secondary | ICD-10-CM | POA: Diagnosis not present

## 2018-12-27 DIAGNOSIS — S41102A Unspecified open wound of left upper arm, initial encounter: Secondary | ICD-10-CM | POA: Diagnosis not present

## 2018-12-27 DIAGNOSIS — N2581 Secondary hyperparathyroidism of renal origin: Secondary | ICD-10-CM | POA: Diagnosis not present

## 2018-12-27 DIAGNOSIS — D631 Anemia in chronic kidney disease: Secondary | ICD-10-CM | POA: Diagnosis not present

## 2018-12-27 NOTE — Telephone Encounter (Signed)
Vaughan Basta with [facility] has called back stating that patient is declining services with them. AS, CMA

## 2018-12-27 NOTE — Telephone Encounter (Signed)
Mickel Baas has spoken with her and is working on facilitating a different home health agency.  I have also instructed Mickel Baas to talk with her about expectations with wound vac therapy and that it is generally not pain free, especially in the early stages of therapy.

## 2018-12-27 NOTE — Telephone Encounter (Signed)
[name] with [facility] home health calling stating that patient has called several time complaining of pain with wound vac. Patient has had wound vac changed 4 times since Friday because of complaint of pain. [name] would like to know what to do. Please advise. AS, CMA

## 2018-12-28 ENCOUNTER — Inpatient Hospital Stay
Admission: EM | Admit: 2018-12-28 | Discharge: 2019-01-02 | DRG: 901 | Disposition: A | Discharge status: Home Health | Payer: Medicare Other | Attending: Internal Medicine | Admitting: Internal Medicine

## 2018-12-28 ENCOUNTER — Encounter: Payer: Self-pay | Admitting: Emergency Medicine

## 2018-12-28 ENCOUNTER — Encounter: Payer: Self-pay | Admitting: Physician Assistant

## 2018-12-28 ENCOUNTER — Other Ambulatory Visit: Payer: Self-pay

## 2018-12-28 DIAGNOSIS — T827XXA Infection and inflammatory reaction due to other cardiac and vascular devices, implants and grafts, initial encounter: Secondary | ICD-10-CM | POA: Diagnosis not present

## 2018-12-28 DIAGNOSIS — N186 End stage renal disease: Secondary | ICD-10-CM | POA: Diagnosis not present

## 2018-12-28 DIAGNOSIS — E1122 Type 2 diabetes mellitus with diabetic chronic kidney disease: Secondary | ICD-10-CM | POA: Diagnosis not present

## 2018-12-28 DIAGNOSIS — E1165 Type 2 diabetes mellitus with hyperglycemia: Secondary | ICD-10-CM | POA: Diagnosis not present

## 2018-12-28 DIAGNOSIS — I5042 Chronic combined systolic (congestive) and diastolic (congestive) heart failure: Secondary | ICD-10-CM | POA: Diagnosis present

## 2018-12-28 DIAGNOSIS — T8189XA Other complications of procedures, not elsewhere classified, initial encounter: Secondary | ICD-10-CM | POA: Diagnosis present

## 2018-12-28 DIAGNOSIS — T8130XA Disruption of wound, unspecified, initial encounter: Secondary | ICD-10-CM | POA: Diagnosis not present

## 2018-12-28 DIAGNOSIS — Z955 Presence of coronary angioplasty implant and graft: Secondary | ICD-10-CM | POA: Diagnosis not present

## 2018-12-28 DIAGNOSIS — Z87891 Personal history of nicotine dependence: Secondary | ICD-10-CM

## 2018-12-28 DIAGNOSIS — D631 Anemia in chronic kidney disease: Secondary | ICD-10-CM | POA: Diagnosis not present

## 2018-12-28 DIAGNOSIS — E785 Hyperlipidemia, unspecified: Secondary | ICD-10-CM | POA: Diagnosis present

## 2018-12-28 DIAGNOSIS — K219 Gastro-esophageal reflux disease without esophagitis: Secondary | ICD-10-CM | POA: Diagnosis present

## 2018-12-28 DIAGNOSIS — R739 Hyperglycemia, unspecified: Secondary | ICD-10-CM | POA: Diagnosis not present

## 2018-12-28 DIAGNOSIS — I252 Old myocardial infarction: Secondary | ICD-10-CM

## 2018-12-28 DIAGNOSIS — E1142 Type 2 diabetes mellitus with diabetic polyneuropathy: Secondary | ICD-10-CM | POA: Diagnosis present

## 2018-12-28 DIAGNOSIS — B182 Chronic viral hepatitis C: Secondary | ICD-10-CM | POA: Diagnosis present

## 2018-12-28 DIAGNOSIS — Z794 Long term (current) use of insulin: Secondary | ICD-10-CM

## 2018-12-28 DIAGNOSIS — I2510 Atherosclerotic heart disease of native coronary artery without angina pectoris: Secondary | ICD-10-CM | POA: Diagnosis not present

## 2018-12-28 DIAGNOSIS — Z833 Family history of diabetes mellitus: Secondary | ICD-10-CM | POA: Diagnosis not present

## 2018-12-28 DIAGNOSIS — G4733 Obstructive sleep apnea (adult) (pediatric): Secondary | ICD-10-CM | POA: Diagnosis present

## 2018-12-28 DIAGNOSIS — Z992 Dependence on renal dialysis: Secondary | ICD-10-CM | POA: Diagnosis not present

## 2018-12-28 DIAGNOSIS — I132 Hypertensive heart and chronic kidney disease with heart failure and with stage 5 chronic kidney disease, or end stage renal disease: Secondary | ICD-10-CM | POA: Diagnosis present

## 2018-12-28 DIAGNOSIS — T8131XA Disruption of external operation (surgical) wound, not elsewhere classified, initial encounter: Secondary | ICD-10-CM | POA: Diagnosis present

## 2018-12-28 DIAGNOSIS — Z7982 Long term (current) use of aspirin: Secondary | ICD-10-CM | POA: Diagnosis not present

## 2018-12-28 DIAGNOSIS — B9689 Other specified bacterial agents as the cause of diseases classified elsewhere: Secondary | ICD-10-CM | POA: Diagnosis not present

## 2018-12-28 DIAGNOSIS — Z03818 Encounter for observation for suspected exposure to other biological agents ruled out: Secondary | ICD-10-CM | POA: Diagnosis not present

## 2018-12-28 DIAGNOSIS — I120 Hypertensive chronic kidney disease with stage 5 chronic kidney disease or end stage renal disease: Secondary | ICD-10-CM | POA: Diagnosis not present

## 2018-12-28 DIAGNOSIS — Z8249 Family history of ischemic heart disease and other diseases of the circulatory system: Secondary | ICD-10-CM | POA: Diagnosis not present

## 2018-12-28 DIAGNOSIS — Y832 Surgical operation with anastomosis, bypass or graft as the cause of abnormal reaction of the patient, or of later complication, without mention of misadventure at the time of the procedure: Secondary | ICD-10-CM | POA: Diagnosis present

## 2018-12-28 DIAGNOSIS — L089 Local infection of the skin and subcutaneous tissue, unspecified: Secondary | ICD-10-CM | POA: Diagnosis not present

## 2018-12-28 DIAGNOSIS — S41109A Unspecified open wound of unspecified upper arm, initial encounter: Secondary | ICD-10-CM | POA: Diagnosis present

## 2018-12-28 DIAGNOSIS — N2581 Secondary hyperparathyroidism of renal origin: Secondary | ICD-10-CM | POA: Diagnosis present

## 2018-12-28 DIAGNOSIS — T8141XA Infection following a procedure, superficial incisional surgical site, initial encounter: Secondary | ICD-10-CM | POA: Diagnosis present

## 2018-12-28 DIAGNOSIS — I5032 Chronic diastolic (congestive) heart failure: Secondary | ICD-10-CM

## 2018-12-28 DIAGNOSIS — B963 Hemophilus influenzae [H. influenzae] as the cause of diseases classified elsewhere: Secondary | ICD-10-CM | POA: Diagnosis not present

## 2018-12-28 DIAGNOSIS — B952 Enterococcus as the cause of diseases classified elsewhere: Secondary | ICD-10-CM | POA: Diagnosis not present

## 2018-12-28 DIAGNOSIS — Z1159 Encounter for screening for other viral diseases: Secondary | ICD-10-CM

## 2018-12-28 DIAGNOSIS — E119 Type 2 diabetes mellitus without complications: Secondary | ICD-10-CM | POA: Diagnosis not present

## 2018-12-28 DIAGNOSIS — M109 Gout, unspecified: Secondary | ICD-10-CM | POA: Diagnosis present

## 2018-12-28 DIAGNOSIS — I509 Heart failure, unspecified: Secondary | ICD-10-CM

## 2018-12-28 LAB — C-REACTIVE PROTEIN: CRP: 1.1 mg/dL — ABNORMAL HIGH (ref <1.0)

## 2018-12-28 LAB — COMPREHENSIVE METABOLIC PANEL
ALT: 23 U/L (ref 0–44)
AST: 24 U/L (ref 15–41)
Albumin: 3.2 g/dL — ABNORMAL LOW (ref 3.5–5.0)
Alkaline Phosphatase: 287 U/L — ABNORMAL HIGH (ref 38–126)
Anion gap: 14 (ref 5–15)
BUN: 44 mg/dL — ABNORMAL HIGH (ref 6–20)
CO2: 26 mmol/L (ref 22–32)
Calcium: 7.9 mg/dL — ABNORMAL LOW (ref 8.9–10.3)
Chloride: 91 mmol/L — ABNORMAL LOW (ref 98–111)
Creatinine, Ser: 5.74 mg/dL — ABNORMAL HIGH (ref 0.44–1.00)
GFR calc Af Amer: 9 mL/min — ABNORMAL LOW (ref >60)
GFR calc non Af Amer: 7 mL/min — ABNORMAL LOW (ref >60)
Glucose, Bld: 565 mg/dL (ref 70–99)
Potassium: 4.4 mmol/L (ref 3.5–5.1)
Sodium: 131 mmol/L — ABNORMAL LOW (ref 135–145)
Total Bilirubin: 0.4 mg/dL (ref 0.3–1.2)
Total Protein: 7.4 g/dL (ref 6.5–8.1)

## 2018-12-28 LAB — CBC WITH DIFFERENTIAL/PLATELET
Abs Immature Granulocytes: 0.1 10*3/uL — ABNORMAL HIGH (ref 0.00–0.07)
Basophils Absolute: 0 10*3/uL (ref 0.0–0.1)
Basophils Relative: 0 %
Eosinophils Absolute: 0.2 10*3/uL (ref 0.0–0.5)
Eosinophils Relative: 2 %
HCT: 31.4 % — ABNORMAL LOW (ref 36.0–46.0)
Hemoglobin: 9.9 g/dL — ABNORMAL LOW (ref 12.0–15.0)
Immature Granulocytes: 1 %
Lymphocytes Relative: 15 %
Lymphs Abs: 1.2 10*3/uL (ref 0.7–4.0)
MCH: 29.6 pg (ref 26.0–34.0)
MCHC: 31.5 g/dL (ref 30.0–36.0)
MCV: 94 fL (ref 80.0–100.0)
Monocytes Absolute: 0.8 10*3/uL (ref 0.1–1.0)
Monocytes Relative: 10 %
Neutro Abs: 6.1 10*3/uL (ref 1.7–7.7)
Neutrophils Relative %: 72 %
Platelets: 189 10*3/uL (ref 150–400)
RBC: 3.34 MIL/uL — ABNORMAL LOW (ref 3.87–5.11)
RDW: 16.3 % — ABNORMAL HIGH (ref 11.5–15.5)
WBC: 8.4 10*3/uL (ref 4.0–10.5)
nRBC: 0 % (ref 0.0–0.2)

## 2018-12-28 LAB — SARS CORONAVIRUS 2 BY RT PCR (HOSPITAL ORDER, PERFORMED IN ~~LOC~~ HOSPITAL LAB): SARS Coronavirus 2: NEGATIVE

## 2018-12-28 LAB — LACTIC ACID, PLASMA
Lactic Acid, Venous: 1.8 mmol/L (ref 0.5–1.9)
Lactic Acid, Venous: 1.6 mmol/L (ref 0.5–1.9)

## 2018-12-28 LAB — GLUCOSE, CAPILLARY: Glucose-Capillary: 534 mg/dL (ref 70–99)

## 2018-12-28 LAB — SEDIMENTATION RATE: Sed Rate: 82 mm/hr — ABNORMAL HIGH (ref 0–30)

## 2018-12-28 LAB — TSH: TSH: 1.56 u[IU]/mL (ref 0.350–4.500)

## 2018-12-28 MED ORDER — MOMETASONE FURO-FORMOTEROL FUM 200-5 MCG/ACT IN AERO
2.0000 | INHALATION_SPRAY | Freq: Two times a day (BID) | RESPIRATORY_TRACT | Status: DC
  Administered 2018-12-29 – 2019-01-02 (×7): 2 via RESPIRATORY_TRACT
  Filled 2018-12-28: qty 8.8

## 2018-12-28 MED ORDER — HYDRALAZINE HCL 50 MG PO TABS
100.0000 mg | ORAL_TABLET | Freq: Three times a day (TID) | ORAL | Status: DC
  Administered 2018-12-28 – 2019-01-02 (×10): 100 mg via ORAL
  Filled 2018-12-28 (×12): qty 2

## 2018-12-28 MED ORDER — INSULIN ASPART 100 UNIT/ML ~~LOC~~ SOLN
10.0000 [IU] | Freq: Once | SUBCUTANEOUS | Status: AC
  Administered 2018-12-28: 10 [IU] via INTRAVENOUS
  Filled 2018-12-28: qty 1

## 2018-12-28 MED ORDER — SODIUM CHLORIDE 0.9 % IV SOLN
250.0000 mL | INTRAVENOUS | Status: DC | PRN
  Administered 2018-12-29: 250 mL via INTRAVENOUS
  Administered 2018-12-30: 15 mL via INTRAVENOUS
  Administered 2019-01-01: 250 mL via INTRAVENOUS

## 2018-12-28 MED ORDER — HYDROMORPHONE HCL 1 MG/ML IJ SOLN
0.5000 mg | Freq: Once | INTRAMUSCULAR | Status: AC
  Administered 2018-12-28: 0.5 mg via INTRAVENOUS
  Filled 2018-12-28: qty 1

## 2018-12-28 MED ORDER — PREGABALIN 75 MG PO CAPS
75.0000 mg | ORAL_CAPSULE | Freq: Two times a day (BID) | ORAL | Status: DC
  Administered 2018-12-28 – 2019-01-02 (×8): 75 mg via ORAL
  Filled 2018-12-28 (×8): qty 1

## 2018-12-28 MED ORDER — HYDROCODONE-ACETAMINOPHEN 5-325 MG PO TABS
1.0000 | ORAL_TABLET | Freq: Four times a day (QID) | ORAL | Status: DC | PRN
  Administered 2018-12-28 – 2019-01-02 (×7): 1 via ORAL
  Filled 2018-12-28 (×7): qty 1

## 2018-12-28 MED ORDER — SODIUM CHLORIDE 0.9% FLUSH: 3.0000 mL | Freq: Once | INTRAVENOUS | Status: DC

## 2018-12-28 MED ORDER — HEPARIN SODIUM (PORCINE) 5000 UNIT/ML IJ SOLN
5000.0000 [IU] | Freq: Three times a day (TID) | INTRAMUSCULAR | Status: DC
  Administered 2018-12-28 – 2019-01-02 (×13): 5000 [IU] via SUBCUTANEOUS
  Filled 2018-12-28 (×13): qty 1

## 2018-12-28 MED ORDER — VITAMIN D (ERGOCALCIFEROL) 1.25 MG (50000 UNIT) PO CAPS
50000.0000 [IU] | ORAL_CAPSULE | ORAL | Status: DC
  Filled 2018-12-28: qty 1

## 2018-12-28 MED ORDER — LORATADINE 10 MG PO TABS
10.0000 mg | ORAL_TABLET | Freq: Every day | ORAL | Status: DC
  Administered 2018-12-28 – 2019-01-01 (×4): 10 mg via ORAL
  Filled 2018-12-28 (×6): qty 1

## 2018-12-28 MED ORDER — ASPIRIN EC 81 MG PO TBEC
81.0000 mg | DELAYED_RELEASE_TABLET | Freq: Every day | ORAL | Status: DC
  Administered 2018-12-29 – 2019-01-02 (×4): 81 mg via ORAL
  Filled 2018-12-28 (×4): qty 1

## 2018-12-28 MED ORDER — INSULIN ASPART 100 UNIT/ML ~~LOC~~ SOLN
0.0000 [IU] | Freq: Every day | SUBCUTANEOUS | Status: DC
  Administered 2018-12-30: 4 [IU] via SUBCUTANEOUS
  Administered 2018-12-31: 3 [IU] via SUBCUTANEOUS
  Filled 2018-12-28 (×2): qty 1

## 2018-12-28 MED ORDER — TORSEMIDE 20 MG PO TABS
40.0000 mg | ORAL_TABLET | Freq: Two times a day (BID) | ORAL | Status: DC
  Administered 2018-12-29 – 2019-01-02 (×6): 40 mg via ORAL
  Filled 2018-12-28 (×10): qty 2

## 2018-12-28 MED ORDER — MORPHINE SULFATE (PF) 2 MG/ML IV SOLN
2.0000 mg | Freq: Once | INTRAVENOUS | Status: AC
  Administered 2018-12-28: 2 mg via INTRAVENOUS
  Filled 2018-12-28: qty 1

## 2018-12-28 MED ORDER — ACETAMINOPHEN 650 MG RE SUPP: 650.0000 mg | Freq: Four times a day (QID) | RECTAL | Status: DC | PRN

## 2018-12-28 MED ORDER — ACETAMINOPHEN 325 MG PO TABS
650.0000 mg | ORAL_TABLET | Freq: Four times a day (QID) | ORAL | Status: DC | PRN
  Filled 2018-12-28: qty 2

## 2018-12-28 MED ORDER — ONDANSETRON HCL 4 MG/2ML IJ SOLN
4.0000 mg | Freq: Four times a day (QID) | INTRAMUSCULAR | Status: DC | PRN
  Administered 2018-12-30: 4 mg via INTRAVENOUS
  Filled 2018-12-28: qty 2

## 2018-12-28 MED ORDER — ALLOPURINOL 100 MG PO TABS
100.0000 mg | ORAL_TABLET | ORAL | Status: DC
  Administered 2018-12-29 – 2019-01-02 (×4): 100 mg via ORAL
  Filled 2018-12-28 (×5): qty 1

## 2018-12-28 MED ORDER — INSULIN ASPART 100 UNIT/ML ~~LOC~~ SOLN
20.0000 [IU] | Freq: Three times a day (TID) | SUBCUTANEOUS | Status: DC
  Administered 2018-12-29 (×2): 20 [IU] via SUBCUTANEOUS
  Filled 2018-12-28: qty 1

## 2018-12-28 MED ORDER — LIRAGLUTIDE 18 MG/3ML ~~LOC~~ SOPN: 0.6000 mg | PEN_INJECTOR | Freq: Every day | SUBCUTANEOUS | Status: DC

## 2018-12-28 MED ORDER — INSULIN ASPART 100 UNIT/ML ~~LOC~~ SOLN
0.0000 [IU] | Freq: Three times a day (TID) | SUBCUTANEOUS | Status: DC
  Administered 2018-12-29 – 2018-12-30 (×3): 15 [IU] via SUBCUTANEOUS
  Administered 2018-12-31: 11 [IU] via SUBCUTANEOUS
  Administered 2018-12-31: 1 [IU] via SUBCUTANEOUS
  Administered 2018-12-31: 5 [IU] via SUBCUTANEOUS
  Administered 2019-01-01: 15 [IU] via SUBCUTANEOUS
  Filled 2018-12-28 (×6): qty 1

## 2018-12-28 MED ORDER — SODIUM CHLORIDE 0.9% FLUSH: 3.0000 mL | Freq: Two times a day (BID) | INTRAVENOUS | Status: DC

## 2018-12-28 MED ORDER — SENNOSIDES-DOCUSATE SODIUM 8.6-50 MG PO TABS
1.0000 | ORAL_TABLET | Freq: Every day | ORAL | Status: DC
  Administered 2018-12-28 – 2019-01-01 (×5): 1 via ORAL
  Filled 2018-12-28 (×5): qty 1

## 2018-12-28 MED ORDER — CARVEDILOL 25 MG PO TABS
25.0000 mg | ORAL_TABLET | Freq: Two times a day (BID) | ORAL | Status: DC
  Administered 2018-12-29 – 2019-01-02 (×7): 25 mg via ORAL
  Filled 2018-12-28 (×9): qty 1

## 2018-12-28 MED ORDER — INSULIN ASPART 100 UNIT/ML ~~LOC~~ SOLN
20.0000 [IU] | Freq: Once | SUBCUTANEOUS | Status: AC
  Administered 2018-12-29: 20 [IU] via SUBCUTANEOUS
  Filled 2018-12-28: qty 1

## 2018-12-28 MED ORDER — SODIUM CHLORIDE 0.9% FLUSH
3.0000 mL | Freq: Two times a day (BID) | INTRAVENOUS | Status: DC
  Administered 2018-12-28 – 2019-01-02 (×10): 3 mL via INTRAVENOUS

## 2018-12-28 MED ORDER — DOCUSATE SODIUM 100 MG PO CAPS: 100.0000 mg | ORAL_CAPSULE | Freq: Two times a day (BID) | ORAL | Status: DC | PRN

## 2018-12-28 MED ORDER — SODIUM CHLORIDE 0.9% FLUSH: 3.0000 mL | INTRAVENOUS | Status: DC | PRN

## 2018-12-28 MED ORDER — ATORVASTATIN CALCIUM 20 MG PO TABS
80.0000 mg | ORAL_TABLET | Freq: Every day | ORAL | Status: DC
  Administered 2018-12-28 – 2019-01-01 (×5): 80 mg via ORAL
  Filled 2018-12-28 (×6): qty 4

## 2018-12-28 MED ORDER — POLYETHYLENE GLYCOL 3350 17 G PO PACK: 17.0000 g | PACK | Freq: Every day | ORAL | Status: DC | PRN

## 2018-12-28 MED ORDER — SODIUM CHLORIDE 0.9 % IV SOLN
3.0000 g | Freq: Two times a day (BID) | INTRAVENOUS | Status: DC
  Administered 2018-12-28 – 2018-12-29 (×2): 3 g via INTRAVENOUS
  Filled 2018-12-28 (×3): qty 3

## 2018-12-28 MED ORDER — VALACYCLOVIR HCL 500 MG PO TABS
500.0000 mg | ORAL_TABLET | ORAL | Status: DC
  Administered 2018-12-29 – 2019-01-02 (×3): 500 mg via ORAL
  Filled 2018-12-28 (×3): qty 1

## 2018-12-28 MED ORDER — ONDANSETRON HCL 4 MG PO TABS: 4.0000 mg | ORAL_TABLET | Freq: Four times a day (QID) | ORAL | Status: DC | PRN

## 2018-12-28 MED ORDER — PANTOPRAZOLE SODIUM 40 MG PO TBEC
40.0000 mg | DELAYED_RELEASE_TABLET | Freq: Every day | ORAL | Status: DC
  Administered 2018-12-29 – 2019-01-02 (×5): 40 mg via ORAL
  Filled 2018-12-28 (×5): qty 1

## 2018-12-28 MED ORDER — FERRIC CITRATE 1 GM 210 MG(FE) PO TABS
420.0000 mg | ORAL_TABLET | Freq: Two times a day (BID) | ORAL | Status: DC
  Administered 2018-12-29 – 2019-01-02 (×8): 420 mg via ORAL
  Filled 2018-12-28 (×10): qty 2

## 2018-12-28 MED ORDER — MONTELUKAST SODIUM 10 MG PO TABS
10.0000 mg | ORAL_TABLET | Freq: Every day | ORAL | Status: DC
  Administered 2018-12-28 – 2019-01-01 (×5): 10 mg via ORAL
  Filled 2018-12-28 (×5): qty 1

## 2018-12-28 NOTE — ED Notes (Signed)
PT expressed distaste for pain medication she is receiving. RN explained that even though Norco has tylenol in it, the hydrocodone is stronger and will help pain better. PT also dissatisfied with amount of time she will have to wait to feel the effects of the medicine.

## 2018-12-28 NOTE — ED Notes (Signed)
Pt states she has been "doubling up" on her percocet's that were prescribed by her doctor for pain. Pt reports taking 2 percocet's this morning around 10am. Wound on left upper inner arm currently covered with dressing the patient placed this morning.

## 2018-12-28 NOTE — H&P (Signed)
[facility] [place] NAME: [name]    MR#:  [id number]  DATE OF BIRTH:  [birth year]  DATE OF ADMISSION:  12/28/2018  PRIMARY CARE PHYSICIAN: [name], PA-C   REQUESTING/REFERRING PHYSICIAN: Duffy Bruce, MD  CHIEF COMPLAINT:   Chief Complaint  Patient presents with  . Wound Check    HISTORY OF PRESENT ILLNESS:  [name]  is a 63 y.o. female with a known history of end-stage renal disease on hemodialysis, diastolic CHF, hyperlipidemia, diabetes mellitus, hypertension, aortic stenosis.  She presented to the emergency room for evaluation of left upper arm nonhealing wound.  Patient initially had left AV fistula removed resulting in current nonhealing wound to her left upper arm.  She underwent debridement with Dr.Schnier on 12/21/2018 with subsequent placement of wound VAC last Friday by home health care.  Wound VAC was replaced 2 days ago, on Monday.  However patient reports cyanotic discoloration of her left hand with severe pain at the wound site.  Therefore, she removed the wound VAC and continue to do wet-to-dry dressings to the area.  Patient has been receiving IV antibiotic therapy for approximately 2 months at the hemodialysis center following dialysis.  On review of records, wound culture at the time of debridement on 12/21/2018 grew enterococcus.  Vascular surgery has been consulted.  She has been admitted to the hospitalist service for further management.  PAST MEDICAL HISTORY:   Past Medical History:  Diagnosis Date  . Anemia   . Aortic stenosis    Echo 8/18: mean 13, peak 28, LVOT/AV mean velocity 0.51  . Arthritis   . Asthma    As a child   . Bronchitis   . CAD (coronary artery disease)    a. 09/2016: 50% Ost 1st Mrg stenosis, 50% 2nd Mrg stenosis, 20% Mid-Cx, 95% Prox LAD, 40% mid-LAD, and 10% dist-LAD stenosis. Staged PCI with DES to Prox-LAD.   [name] Chronic combined systolic and diastolic CHF (congestive  heart failure) ([place]) 2011   echo 2/18: EF 55-60, normal wall motion, grade 2 diastolic dysfunction, trivial AI // echo 3/18: Septal and apical HK, EF 45-50, normal wall motion, trivial AI, mild LAE, PASP 38 // echo 8/18: EF 60-65, normal wall motion, grade 1 diastolic dysfunction, calcified aortic valve leaflets, mild aortic stenosis (mean 13, peak 28, LVOT/AV mean velocity 0.51), mild AI, moderate MAC, mild LAE, trivial TR   . Chronic kidney disease    STAGE 4  . Chronic kidney disease on chronic dialysis (HCC)    t, th, sat  . Complication of anesthesia   . Depression   . Diabetes mellitus Dx 1989  . Elevated lipids   . GERD (gastroesophageal reflux disease)   . Gout   . Heart murmur    asymptomatic  . Hepatitis C Dx 2013  . Hypertension Dx 1989  . Infected surgical wound    Lt arm  . Myocardial infarction ([place]) 07/2015  . Obesity   . Pancreatitis 2013  . Pneumonia   . Refusal of blood transfusions as patient is Jehovah's Witness   . Tendinitis   . Tremors of nervous system    LEFT HAND  . Ulcer 2010    PAST SURGICAL HISTORY:   Past Surgical History:  Procedure Laterality Date  . APPLICATION OF WOUND VAC Left 08/14/2017   Procedure: APPLICATION OF WOUND VAC Exchange;  Surgeon: Robert Bellow, MD;  Location: [facility] ORS;  Service: General;  Laterality: Left;  .  APPLICATION OF WOUND VAC Left 12/21/2018   Procedure: APPLICATION OF WOUND VAC;  Surgeon: Katha Cabal, MD;  Location: [facility] ORS;  Service: Vascular;  Laterality: Left;  . AV FISTULA PLACEMENT Left 08/19/2018   Procedure: ARTERIOVENOUS (AV) FISTULA CREATION ( BRACHIOBASILIC );  Surgeon: Katha Cabal, MD;  Location: [facility] OR[facility];  Service: Vascular;  Laterality: Left;  . BASCILIC VEIN TRANSPOSITION Left 11/18/2018   Procedure: BASCILIC VEIN TRANSPOSITION;  Surgeon: Katha Cabal, MD;  Location: [facility] OR[facility];  Service: Vascular;  Laterality: Left;  . CHOLECYSTECTOMY    . COLONOSCOPY WITH PROPOFOL N/A 02/03/2018    Procedure: COLONOSCOPY WITH PROPOFOL;  Surgeon: Lin Landsman, MD;  Location[facility] ENDOSCOPY;  Service: Gastroenterology;  Laterality: N/A;  . CORONARY ANGIOPLASTY  07/2015   STENT  . CORONARY STENT INTERVENTION N/A 09/18/2016   Procedure: Coronary Stent Intervention;  Surgeon: Troy Sine, MD;  Location: [place] [facility];  Service: Cardiovascular;  Laterality: N/A;  . DIALYSIS/PERMA CATHETER INSERTION N/A 05/10/2018   Procedure: DIALYSIS/PERMA CATHETER INSERTION;  Surgeon: Katha Cabal, MD;  Location: [place] [facility];  Service: Cardiovascular;  Laterality: N/A;  . DRESSING CHANGE UNDER ANESTHESIA Left 08/15/2017   Procedure: exploration of wound for bleeding;  Surgeon: Robert Bellow, MD;  Location: [facility] ORS;  Service: General;  Laterality: Left;  . ESOPHAGOGASTRODUODENOSCOPY (EGD) WITH PROPOFOL N/A 02/03/2018   Procedure: ESOPHAGOGASTRODUODENOSCOPY (EGD) WITH PROPOFOL;  Surgeon: Lin Landsman, MD;  Location: [facility] ENDOSCOPY;  Service: Gastroenterology;  Laterality: N/A;  . EYE SURGERY  11/17/2018  . INCISION AND DRAINAGE ABSCESS Left 08/12/2017   Procedure: INCISION AND DRAINAGE ABSCESS;  Surgeon: Robert Bellow, MD;  Location: [facility] ORS;  Service: General;  Laterality: Left;  . KNEE ARTHROSCOPY    . LEFT HEART CATH N/A 09/18/2016   Procedure: Left Heart Cath;  Surgeon: Troy Sine, MD;  Location: [place] [facility];  Service: Cardiovascular;  Laterality: N/A;  . LEFT HEART CATH AND CORONARY ANGIOGRAPHY N/A 09/16/2016   Procedure: Left Heart Cath and Coronary Angiography;  Surgeon: Burnell Blanks, MD;  Location: [place] [facility];  Service: Cardiovascular;  Laterality: N/A;  . LEFT HEART CATH AND CORONARY ANGIOGRAPHY N/A 04/29/2017   Procedure: LEFT HEART CATH AND CORONARY ANGIOGRAPHY;  Surgeon: Nelva Bush, MD;  Location: [place] [facility];  Service: Cardiovascular;  Laterality: N/A;  . LOWER EXTREMITY ANGIOGRAPHY Right 03/08/2018   Procedure: LOWER  EXTREMITY ANGIOGRAPHY;  Surgeon: Katha Cabal, MD;  Location: [place] [facility];  Service: Cardiovascular;  Laterality: Right;  . TUBAL LIGATION    . TUBAL LIGATION    . WOUND DEBRIDEMENT Left 12/21/2018   Procedure: DEBRIDEMENT WOUND;  Surgeon: Katha Cabal, MD;  Location: [facility] ORS;  Service: Vascular;  Laterality: Left;    SOCIAL HISTORY:   Social History   Tobacco Use  . Smoking status: Former Smoker    Packs/day: 0.25    Years: 6.00    Pack years: 1.50    Types: Cigarettes    Quit date: 10/25/1980    Years since quitting: 38.2  . Smokeless tobacco: Never Used  Substance Use Topics  . Alcohol use: Yes    Comment: rarely    FAMILY HISTORY:   Family History  Problem Relation Age of Onset  . Colon cancer Mother   . Heart attack Other   . Heart attack Maternal Grandmother   . Hypertension Sister   . Hypertension Brother   . Diabetes Paternal Grandmother   .  Breast cancer Neg Hx     DRUG ALLERGIES:   Allergies  Allergen Reactions  . Shellfish Allergy Anaphylaxis and Swelling  . Diazepam Other (See Comments)    "felt like out of body experience"    REVIEW OF SYSTEMS:   Review of Systems  Constitutional: Positive for malaise/fatigue. Negative for chills, diaphoresis, fever and weight loss.  HENT: Negative for congestion, sinus pain and sore throat.   Eyes: Negative for blurred vision and double vision.  Respiratory: Negative for cough, shortness of breath and wheezing.   Cardiovascular: Negative for chest pain and palpitations.  Gastrointestinal: Negative for abdominal pain, diarrhea, heartburn, nausea and vomiting.  Genitourinary: Negative for dysuria and urgency.  Musculoskeletal: Negative for falls and myalgias.  Skin: Negative for itching and rash.  Neurological: Negative for dizziness, weakness and headaches.  Psychiatric/Behavioral: Negative for depression.    MEDICATIONS AT HOME:   Prior to Admission medications   Medication Sig Start  Date End Date Taking? Authorizing Provider  Accu-Chek FastClix Lancets MISC USE AS DIRECTED TO CHECK BLOOD SUGAR THREE TIMES DAILY AND AT BEDTIME 09/28/18  Yes Burnette, Jennifer M, PA-C  albuterol (PROAIR HFA) 108 (90 Base) MCG/ACT inhaler INHALE 2 PUFFS BY MOUTH EVERY 4 HOURS AS NEEDED FOR SHORTNESS OF BREATH Patient taking differently: Inhale 2 puffs into the lungs every 4 (four) hours as needed for wheezing or shortness of breath. INHALE 2 PUFFS BY MOUTH EVERY 4 HOURS AS NEEDED FOR SHORTNESS OF BREATH 06/24/18  Yes [name], PA-C  allopurinol (ZYLOPRIM) 100 MG tablet Take 1 tablet (100 mg total) by mouth every morning. 12/16/18  Yes [name], PA-C  aspirin EC 81 MG EC tablet Take 1 tablet (81 mg total) by mouth daily. 09/19/16  Yes Strader, [place] M, PA-C  atorvastatin (LIPITOR) 80 MG tablet TAKE 1 TABLET BY MOUTH ONCE DAILY AT 6PM Patient taking differently: Take 80 mg by mouth daily.  08/23/18  Yes Burnette, Clearnce Sorrel, PA-C  AURYXIA 1 GM 210 MG(Fe) tablet Take 420 mg by mouth 2 (two) times daily with a meal.  10/10/18  Yes [provider]  carvedilol (COREG) 25 MG tablet TAKE (1) TABLET BY MOUTH TWICE A DAY WITH MEALS (BREAKFAST AND SUPPER) Patient taking differently: Take 25 mg by mouth 2 (two) times daily with a meal.  08/23/18  Yes Burnette, Anderson Malta M, PA-C  docusate sodium (COLACE) 100 MG capsule Take 1 capsule (100 mg total) by mouth 2 (two) times daily as needed for mild constipation. 09/20/17  Yes Gouru, Illene Silver, MD  fluticasone (FLONASE) 50 MCG/ACT nasal spray Place 2 sprays into both nostrils daily as needed for allergies or rhinitis. 09/28/17  Yes [name], PA-C  hydrALAZINE (APRESOLINE) 100 MG tablet TAKE (1) TABLET BY MOUTH THREE TIMES DAILY Patient taking differently: Take 100 mg by mouth 3 (three) times daily.  07/25/18  Yes [name], PA-C  hydrOXYzine (ATARAX/VISTARIL) 25 MG tablet Take 1 tablet (25 mg total) by mouth every 6 (six)  hours as needed for itching. 12/07/18  Yes Burnette, Clearnce Sorrel, PA-C  liraglutide (VICTOZA) 18 MG/3ML SOPN Start with 0.31m daily and increase by 0.642mper week until max dose of 1.8 mg dose achieved. Patient taking differently: Inject 0.6 mg into the skin daily. Start with 0.55m55maily and increase by 0.55mg555mr week until max dose of 1.8 mg dose achieved. 10/20/18  Yes BurnFenton MallingPA-C  loratadine (CLARITIN) 10 MG tablet TAKE 1 TABLET BY MOUTH ONCE DAILY. Patient  taking differently: Take 10 mg by mouth at bedtime.  09/20/18  Yes Burnette, Jennifer M, PA-C  mometasone (ELOCON) 0.1 % cream APPLY AS DIRECTED TO AFFECTED AREA ONCE DAILY. 12/08/18  Yes Burnette, Anderson Malta M, PA-C  mometasone-formoterol (DULERA) 200-5 MCG/ACT AERO Inhale 2 puffs into the lungs 2 (two) times daily. Patient taking differently: Inhale 2 puffs into the lungs 2 (two) times daily as needed for wheezing or shortness of breath.  08/03/18  Yes Burnette, Anderson Malta M, PA-C  montelukast (SINGULAIR) 10 MG tablet TAKE ONE TABLET BY MOUTH AT BEDTIME. Patient taking differently: Take 10 mg by mouth at bedtime.  11/18/18  Yes [name], PA-C  nitroGLYCERIN (NITROSTAT) 0.4 MG SL tablet Place 1 tablet (0.4 mg total) under the tongue every 5 (five) minutes x 3 doses as needed for chest pain. 03/19/17  Yes Barrett, Rhonda G, PA-C  NOVOLOG FLEXPEN 100 UNIT/ML FlexPen INJECT 25 UNITS S.Q. THREE TIMES A DAY WITH MEALS. Patient taking differently: Inject 30 Units into the skin 3 (three) times daily with meals.  10/20/18  Yes [name] M, PA-C  omeprazole (PRILOSEC) 20 MG capsule TAKE (1) CAPSULE BY MOUTH ONCE DAILY. Patient taking differently: Take 20 mg by mouth daily.  09/20/18  Yes [name], PA-C  oxyCODONE (ROXICODONE) 5 MG immediate release tablet Take 1-2 tablets (5-10 mg total) by mouth every 6 (six) hours as needed for moderate pain or severe pain. 12/21/18 12/21/19 Yes Schnier, Dolores Lory, MD  pregabalin (LYRICA)  75 MG capsule Take 1 capsule (75 mg total) by mouth 2 (two) times daily. 07/25/18  Yes Burnette, Jennifer M, PA-C  SENNA PLUS 8.6-50 MG tablet TAKE ONE TABLET BY MOUTH AT BEDTIME. Patient taking differently: Take 1 tablet by mouth at bedtime.  09/20/18  Yes [name], PA-C  silver sulfADIAZINE (SILVADENE) 1 % cream Apply 1 application topically daily. Patient taking differently: Apply 1 application topically daily as needed (sore).  09/09/18  Yes Kris Hartmann, NP  torsemide (DEMADEX) 20 MG tablet TAKE (2) TABLETS BY MOUTH TWICE DAILY. Patient taking differently: Take 40 mg by mouth 2 (two) times daily.  07/25/18  Yes [name], PA-C  traMADol (ULTRAM) 50 MG tablet One to Two Tabs Every Six Hours as Needed For Pain Patient taking differently: Take 50-100 mg by mouth every 6 (six) hours as needed (pain). One to Two Tabs Every Six Hours as Needed For Pain 08/30/18  Yes Stegmayer, Joelene Millin A, PA-C  triamcinolone cream (KENALOG) 0.1 % APPLY TO AFFECTED AREA TWICE DAILY Patient taking differently: Apply 1 application topically daily as needed (itching).  06/17/18  Yes Burnette, Anderson Malta M, PA-C  valACYclovir (VALTREX) 500 MG tablet TAKE (1) TABLET BY MOUTH EVERY OTHER DAY. Patient taking differently: Take 500 mg by mouth every other day.  11/18/18  Yes Burnette, Clearnce Sorrel, PA-C  Vitamin D, Ergocalciferol, (DRISDOL) 50000 units CAPS capsule TAKE 1 CAPSULE BY MOUTH ONCE A MONTH Patient taking differently: Take 50,000 Units by mouth every 30 (thirty) days.  03/08/18  Yes [name], PA-C  gabapentin (NEURONTIN) 100 MG capsule Take 1 capsule (100 mg total) by mouth 3 (three) times daily. 08/19/17 09/01/17  [name], MD      VITAL SIGNS:  Blood pressure 134/67, pulse 81, temperature 98.3 F (36.8 C), temperature source Oral, resp. rate 16, weight 131.1 kg, SpO2 93 %.  PHYSICAL EXAMINATION:  Physical Exam Vitals signs and nursing note reviewed.  Constitutional:      General:  She is not in acute distress.    Appearance: She is ill-appearing.  HENT:     Head: Normocephalic.     Right Ear: External ear normal.     Left Ear: External ear normal.     Nose: Nose normal.     Mouth/Throat:     Mouth: Mucous membranes are moist.     Pharynx: Oropharynx is clear.  Eyes:     General: No scleral icterus.    Extraocular Movements: Extraocular movements intact.     Conjunctiva/sclera: Conjunctivae normal.     Pupils: Pupils are equal, round, and reactive to light.  Neck:     Musculoskeletal: Normal range of motion and neck supple. No muscular tenderness.  Cardiovascular:     Rate and Rhythm: Normal rate and regular rhythm.     Pulses: Normal pulses.     Heart sounds: Normal heart sounds. No murmur. No friction rub. No gallop.   Pulmonary:     Effort: Pulmonary effort is normal. No respiratory distress.     Breath sounds: Normal breath sounds. No wheezing, rhonchi or rales.  Abdominal:     General: Bowel sounds are normal. There is no distension.     Palpations: Abdomen is soft.     Tenderness: There is no abdominal tenderness. There is no guarding or rebound.  Musculoskeletal: Normal range of motion.        General: Tenderness (Left upper extremity wound) present.     Right lower leg: No edema.     Left lower leg: No edema.  Skin:    General: Skin is warm and dry.     Capillary Refill: Capillary refill takes less than 2 seconds.     Findings: No rash.  Neurological:     General: No focal deficit present.     Mental Status: She is alert and oriented to person, place, and time.     Motor: No weakness.  Psychiatric:        Mood and Affect: Mood normal.        Behavior: Behavior normal.    LABORATORY PANEL:   CBC Recent Labs  Lab 12/28/18 1345  WBC 8.4  HGB 9.9*  HCT 31.4*  PLT 189   ------------------------------------------------------------------------------------------------------------------  Chemistries  Recent Labs  Lab 12/28/18 1345   NA 131*  K 4.4  CL 91*  CO2 26  GLUCOSE 565*  BUN 44*  CREATININE 5.74*  CALCIUM 7.9*  AST 24  ALT 23  ALKPHOS 287*  BILITOT 0.4   ------------------------------------------------------------------------------------------------------------------  Cardiac Enzymes No results for input(s): TROPONINI in the last 168 hours. ------------------------------------------------------------------------------------------------------------------  RADIOLOGY:  No results found.    IMPRESSION AND PLAN:   1. nonhealing left upper arm wound - Blood cultures are pending - IV antibiotic therapy initiated with Unasyn - Dr.Schnier- Vascular surgery conulted - Wound care nurse consulted for management - We will repeat CBC and BMP in the a.m.  2.  End-stage renal disease on hemodialysis - Nephrology consulted for hemodialysis Tuesday, Thursday, Friday - Patient has right chest permacath access - Repeat BMP in the a.m.  3.  Diabetes mellitus with hyperglycemia - Moderate sliding scale insulin -Continue scheduled NovoLog insulin 3 times daily - Hemoglobin A1c in the a.m. - Glucose 565 on arrival.  She received 10 units IV insulin in the emergency room.  4.  CHF - No evidence of exacerbation. -We will get chest x-[place] in the a.m. -Telemetry monitoring -CBC and BMP in the a.m.   All the  records are reviewed and case discussed with ED provider. The plan of care was discussed in details with the patient (and family). I answered all questions. The patient agreed to proceed with the above mentioned plan. Further management will depend upon hospital course.   CODE STATUS: Full code  TOTAL TIME TAKING CARE OF THIS PATIENT:81mnutes.    ADay Heights6/17/2020 at 6:54 PM  Pager - [phone] After 6pm go to [email or web address] - password [email or web address] [facility] [place] [facility]  Office  [phone] CC: Primary care physician; BMar Daring PA-C   Note: This  dictation was prepared with Dragon dictation along with smaller phrase technology. Any transcriptional errors that result from this process are unintentional.

## 2018-12-28 NOTE — ED Triage Notes (Signed)
Patient had surgery to left upper arm last Wednesday for AV Shunt.  Seen by a nurse at home to manage a wound vac.  Patient states after two home visits by nurse, left arm started hurting.  States left arm started to swell, painful, and cool to touch on Saturday at dialysis.  STates she took wound vac off and has been doing wet to dry dressings since Saturday.  States arm continues to hurt.  Surgery done at [facility].  Arrives to ED today for wound to be checked and to manage pain.

## 2018-12-28 NOTE — ED Provider Notes (Addendum)
[facility] Emergency Department Provider Note  ____________________________________________   First MD Initiated Contact with Patient 12/28/18 1528     (approximate)  I have reviewed the triage vital signs and the nursing notes.   HISTORY  Chief Complaint Wound Check    HPI [name] is a 63 y.o. female 31-year-old female with extensive past medical history as below including end-stage renal disease, coronary disease, chronic wound of left upper extremity status post recent irrigation and debridement on 6/10, here with increasing pain, redness, and drainage from left upper arm wound.  The patient states that her wound was last dressed on Monday.  She states that she believes the nurse put her wound VAC on incorrectly and she had developed worsening pain and throbbing/numbness sensation distal to the wound VAC.        Past Medical History:  Diagnosis Date   Anemia    Aortic stenosis    Echo 8/18: mean 13, peak 28, LVOT/AV mean velocity 0.51   Arthritis    Asthma    As a child    Bronchitis    CAD (coronary artery disease)    a. 09/2016: 50% Ost 1st Mrg stenosis, 50% 2nd Mrg stenosis, 20% Mid-Cx, 95% Prox LAD, 40% mid-LAD, and 10% dist-LAD stenosis. Staged PCI with DES to Prox-LAD.    Chronic combined systolic and diastolic CHF (congestive heart failure) ([place]) 2011   echo 2/18: EF 55-60, normal wall motion, grade 2 diastolic dysfunction, trivial AI // echo 3/18: Septal and apical HK, EF 45-50, normal wall motion, trivial AI, mild LAE, PASP 38 // echo 8/18: EF 60-65, normal wall motion, grade 1 diastolic dysfunction, calcified aortic valve leaflets, mild aortic stenosis (mean 13, peak 28, LVOT/AV mean velocity 0.51), mild AI, moderate MAC, mild LAE, trivial TR    Chronic kidney disease    STAGE 4   Chronic kidney disease on chronic dialysis (HCC)    t, th, sat   Complication of anesthesia    Depression    Diabetes mellitus Dx 1989    Elevated lipids    GERD (gastroesophageal reflux disease)    Gout    Heart murmur    asymptomatic   Hepatitis C Dx 2013   Hypertension Dx 1989   Infected surgical wound    Lt arm   Myocardial infarction ([place]) 07/2015   Obesity    Pancreatitis 2013   Pneumonia    Refusal of blood transfusions as patient is Jehovah's Witness    Tendinitis    Tremors of nervous system    LEFT HAND   Ulcer 2010    Patient Active Problem List   Diagnosis Date Noted   Complication from renal dialysis device 10/20/2018   ESRD on dialysis ([place]) 06/01/2018   ARF (acute renal failure) ([place]) 05/09/2018   Colon cancer screening    LGSIL on Pap smear of cervix 01/27/2018   Gout 12/25/2017   AKI (acute kidney injury) ([place]) 12/24/2017   Hyperglycemia 12/24/2017   Chronic diastolic heart failure ([place]) 09/30/2017   Lymphedema 09/30/2017   Aortic stenosis    Chronic pain of right knee 02/12/2017   Chronic gout due to renal impairment of multiple sites without tophus 02/12/2017   Esophageal dysphagia 02/12/2017   Osteoarthritis 01/22/2017   Diabetic hyperosmolar non-ketotic state ([place]) 12/13/2016   CAD (coronary artery disease) 12/13/2016   Hyperlipidemia 12/13/2016   Hyperphosphatemia 12/13/2016   Asthma 11/29/2015   Depression 09/05/2015   GERD (gastroesophageal reflux disease) 03/25/2015  Environmental allergies 03/14/2015   Chronic hepatitis C without hepatic coma ([place]) 01/31/2015   Diabetic neuropathy ([place]) 01/31/2015   OSA (obstructive sleep apnea) 01/01/2015   Poor dentition 11/13/2014   Essential hypertension 10/08/2014   Obesity 10/08/2014   Diabetes mellitus type 2, uncontrolled, with complications ([place]) 38/33/3832    Class: Chronic    Past Surgical History:  Procedure Laterality Date   APPLICATION OF WOUND VAC Left 08/14/2017   Procedure: APPLICATION OF WOUND VAC Exchange;  Surgeon: Robert Bellow, MD;  Location: [facility] ORS;  Service:  General;  Laterality: Left;   APPLICATION OF WOUND VAC Left 12/21/2018   Procedure: APPLICATION OF WOUND VAC;  Surgeon: Katha Cabal, MD;  Location: [facility] ORS;  Service: Vascular;  Laterality: Left;   AV FISTULA PLACEMENT Left 08/19/2018   Procedure: ARTERIOVENOUS (AV) FISTULA CREATION ( BRACHIOBASILIC );  Surgeon: Katha Cabal, MD;  Location: [facility] ORS;  Service: Vascular;  Laterality: Left;   BASCILIC VEIN TRANSPOSITION Left 11/18/2018   Procedure: BASCILIC VEIN TRANSPOSITION;  Surgeon: Katha Cabal, MD;  Location: [facility] ORS;  Service: Vascular;  Laterality: Left;   CHOLECYSTECTOMY     COLONOSCOPY WITH PROPOFOL N/A 02/03/2018   Procedure: COLONOSCOPY WITH PROPOFOL;  Surgeon: Lin Landsman, MD;  Location: [facility] ENDOSCOPY;  Service: Gastroenterology;  Laterality: N/A;   CORONARY ANGIOPLASTY  07/2015   STENT   CORONARY STENT INTERVENTION N/A 09/18/2016   Procedure: Coronary Stent Intervention;  Surgeon: Troy Sine, MD;  Location: [place] CV LAB;  Service: Cardiovascular;  Laterality: N/A;   DIALYSIS/PERMA CATHETER INSERTION N/A 05/10/2018   Procedure: DIALYSIS/PERMA CATHETER INSERTION;  Surgeon: Katha Cabal, MD;  Location: [place] [facility];  Service: Cardiovascular;  Laterality: N/A;   DRESSING CHANGE UNDER ANESTHESIA Left 08/15/2017   Procedure: exploration of wound for bleeding;  Surgeon: Robert Bellow, MD;  Location: [facility] ORS;  Service: General;  Laterality: Left;   ESOPHAGOGASTRODUODENOSCOPY (EGD) WITH PROPOFOL N/A 02/03/2018   Procedure: ESOPHAGOGASTRODUODENOSCOPY (EGD) WITH PROPOFOL;  Surgeon: Lin Landsman, MD;  Location: [facility] ENDOSCOPY;  Service: Gastroenterology;  Laterality: N/A;   EYE SURGERY  11/17/2018   INCISION AND DRAINAGE ABSCESS Left 08/12/2017   Procedure: INCISION AND DRAINAGE ABSCESS;  Surgeon: Robert Bellow, MD;  Location: [facility] ORS;  Service: General;  Laterality: Left;   KNEE ARTHROSCOPY     LEFT HEART CATH N/A  09/18/2016   Procedure: Left Heart Cath;  Surgeon: Troy Sine, MD;  Location: [place] [facility] LAB;  Service: Cardiovascular;  Laterality: N/A;   LEFT HEART CATH AND CORONARY ANGIOGRAPHY N/A 09/16/2016   Procedure: Left Heart Cath and Coronary Angiography;  Surgeon: Burnell Blanks, MD;  Location: [place] [facility];  Service: Cardiovascular;  Laterality: N/A;   LEFT HEART CATH AND CORONARY ANGIOGRAPHY N/A 04/29/2017   Procedure: LEFT HEART CATH AND CORONARY ANGIOGRAPHY;  Surgeon: Nelva Bush, MD;  Location: [place] [facility] LAB;  Service: Cardiovascular;  Laterality: N/A;   LOWER EXTREMITY ANGIOGRAPHY Right 03/08/2018   Procedure: LOWER EXTREMITY ANGIOGRAPHY;  Surgeon: Katha Cabal, MD;  Location: [place] [facility];  Service: Cardiovascular;  Laterality: Right;   TUBAL LIGATION     TUBAL LIGATION     WOUND DEBRIDEMENT Left 12/21/2018   Procedure: DEBRIDEMENT WOUND;  Surgeon: Katha Cabal, MD;  Location: [facility] ORS;  Service: Vascular;  Laterality: Left;    Prior to Admission medications   Medication Sig Start Date End Date Taking? Authorizing Provider  Accu-Chek FastClix Lancets  MISC USE AS DIRECTED TO CHECK BLOOD SUGAR THREE TIMES DAILY AND AT BEDTIME 09/28/18   [name], PA-C  albuterol (PROAIR HFA) 108 (90 Base) MCG/ACT inhaler INHALE 2 PUFFS BY MOUTH EVERY 4 HOURS AS NEEDED FOR SHORTNESS OF BREATH Patient taking differently: Inhale 2 puffs into the lungs every 4 (four) hours as needed for wheezing or shortness of breath. INHALE 2 PUFFS BY MOUTH EVERY 4 HOURS AS NEEDED FOR SHORTNESS OF BREATH 06/24/18   [name], PA-C  allopurinol (ZYLOPRIM) 100 MG tablet Take 1 tablet (100 mg total) by mouth every morning. 12/16/18   [name], PA-C  aspirin EC 81 MG EC tablet Take 1 tablet (81 mg total) by mouth daily. 09/19/16   Strader, Fransisco Hertz, PA-C  atorvastatin (LIPITOR) 80 MG tablet TAKE 1 TABLET BY MOUTH ONCE DAILY AT 6PM Patient taking  differently: Take 80 mg by mouth daily.  08/23/18   [name], PA-C  AURYXIA 1 GM 210 MG(Fe) tablet Take 420 mg by mouth 2 (two) times daily with a meal.  10/10/18   [provider]  carvedilol (COREG) 25 MG tablet TAKE (1) TABLET BY MOUTH TWICE A DAY WITH MEALS (BREAKFAST AND SUPPER) Patient taking differently: Take 25 mg by mouth 2 (two) times daily with a meal.  08/23/18   Burnette, Clearnce Sorrel, PA-C  docusate sodium (COLACE) 100 MG capsule Take 1 capsule (100 mg total) by mouth 2 (two) times daily as needed for mild constipation. 09/20/17   Nicholes Mango, MD  doxycycline (VIBRAMYCIN) 100 MG capsule Take 1 capsule (100 mg total) by mouth 2 (two) times daily. Patient not taking: Reported on 12/20/2018 11/25/18   Kris Hartmann, NP  fluticasone [facility]) 50 MCG/ACT nasal spray Place 2 sprays into both nostrils daily as needed for allergies or rhinitis. 09/28/17   [name], PA-C  glucose blood (ACCU-CHEK GUIDE) test strip To check blood sugar before meals and bedtime 04/14/18   [name], PA-C  hydrALAZINE (APRESOLINE) 100 MG tablet TAKE (1) TABLET BY MOUTH THREE TIMES DAILY Patient taking differently: Take 100 mg by mouth 3 (three) times daily.  07/25/18   [name], PA-C  hydrOXYzine (ATARAX/VISTARIL) 25 MG tablet Take 1 tablet (25 mg total) by mouth every 6 (six) hours as needed for itching. 12/07/18   Burnette, Clearnce Sorrel, PA-C  ketorolac (ACULAR) 0.4 % SOLN Place 1 drop into the right eye 2 (two) times a day.    [provider]  Lancet Devices [facility]) lancets Use as instructed daily. 04/14/17   Charlott Rakes, MD  liraglutide (VICTOZA) 18 MG/3ML SOPN Start with 0.69m daily and increase by 0.683mper week until max dose of 1.8 mg dose achieved. Patient taking differently: Inject 0.6 mg into the skin daily. Start with 0.30m71maily and increase by 0.30mg70mr week until max dose of 1.8 mg dose achieved. 10/20/18   BurnMar Daring-C    loratadine (CLARITIN) 10 MG tablet TAKE 1 TABLET BY MOUTH ONCE DAILY. Patient taking differently: Take 10 mg by mouth at bedtime.  09/20/18   BurnMar Daring-C  mometasone (ELOCON) 0.1 % cream APPLY AS DIRECTED TO AFFECTED AREA ONCE DAILY. 12/08/18   BurnMar Daring-C  mometasone-formoterol (DULERA) 200-5 MCG/ACT AERO Inhale 2 puffs into the lungs 2 (two) times daily. Patient taking differently: Inhale 2 puffs into the lungs 2 (two) times daily as needed for wheezing or shortness of breath.  08/03/18  [name] M, PA-C  montelukast (SINGULAIR) 10 MG tablet TAKE ONE TABLET BY MOUTH AT BEDTIME. Patient taking differently: Take 10 mg by mouth at bedtime.  11/18/18   [name], PA-C  moxifloxacin (VIGAMOX) 0.5 % ophthalmic solution Place 1 drop into the right eye 2 (two) times a day.    [provider]  multivitamin (RENA-VIT) TABS tablet Take 1 tablet by mouth 2 (two) times a day.     [provider]  nitroGLYCERIN (NITROSTAT) 0.4 MG SL tablet Place 1 tablet (0.4 mg total) under the tongue every 5 (five) minutes x 3 doses as needed for chest pain. 03/19/17   Barrett, Rhonda G, PA-C  NOVOLOG FLEXPEN 100 UNIT/ML FlexPen INJECT 25 UNITS S.Q. THREE TIMES A DAY WITH MEALS. Patient taking differently: Inject 30 Units into the skin 3 (three) times daily with meals.  10/20/18   [name], PA-C  omeprazole (PRILOSEC) 20 MG capsule TAKE (1) CAPSULE BY MOUTH ONCE DAILY. Patient taking differently: Take 20 mg by mouth daily.  09/20/18   [name], PA-C  oxyCODONE (ROXICODONE) 5 MG immediate release tablet Take 1-2 tablets (5-10 mg total) by mouth every 6 (six) hours as needed for moderate pain or severe pain. 12/21/18 12/21/19  Schnier, Dolores Lory, MD  pregabalin (LYRICA) 75 MG capsule Take 1 capsule (75 mg total) by mouth 2 (two) times daily. 07/25/18   Burnette, Jennifer M, PA-C  SENNA PLUS 8.6-50 MG tablet TAKE ONE TABLET BY MOUTH AT  BEDTIME. Patient taking differently: Take 1 tablet by mouth at bedtime.  09/20/18   [name], PA-C  silver sulfADIAZINE (SILVADENE) 1 % cream Apply 1 application topically daily. Patient taking differently: Apply 1 application topically daily as needed (sore).  09/09/18   Kris Hartmann, NP  torsemide (DEMADEX) 20 MG tablet TAKE (2) TABLETS BY MOUTH TWICE DAILY. Patient taking differently: Take 40 mg by mouth 2 (two) times daily.  07/25/18   [name], PA-C  traMADol (ULTRAM) 50 MG tablet One to Two Tabs Every Six Hours as Needed For Pain Patient taking differently: Take 50-100 mg by mouth every 6 (six) hours as needed (pain). One to Two Tabs Every Six Hours as Needed For Pain 08/30/18   Stegmayer, Joelene Millin A, PA-C  triamcinolone cream (KENALOG) 0.1 % APPLY TO AFFECTED AREA TWICE DAILY Patient taking differently: Apply 1 application topically daily as needed (itching).  06/17/18   [name], PA-C  ULTICARE SHORT PEN NEEDLES 31G X 8 MM MISC USE THREE TIMES DAILY WITH INSULIN 12/02/18   [name], PA-C  valACYclovir (VALTREX) 500 MG tablet TAKE (1) TABLET BY MOUTH EVERY OTHER DAY. Patient taking differently: Take 500 mg by mouth every other day.  11/18/18   [name], PA-C  Vitamin D, Ergocalciferol, (DRISDOL) 50000 units CAPS capsule TAKE 1 CAPSULE BY MOUTH ONCE A MONTH Patient taking differently: Take 50,000 Units by mouth every 30 (thirty) days.  03/08/18   [name], PA-C  gabapentin (NEURONTIN) 100 MG capsule Take 1 capsule (100 mg total) by mouth 3 (three) times daily. 08/19/17 09/01/17  [name], MD    Allergies Shellfish allergy and Diazepam  Family History  Problem Relation Age of Onset   Colon cancer Mother    Heart attack Other    Heart attack Maternal Grandmother    Hypertension Sister    Hypertension Brother    Diabetes Paternal Grandmother    Breast cancer Neg Hx  Social History Social History    Tobacco Use   Smoking status: Former Smoker    Packs/day: 0.25    Years: 6.00    Pack years: 1.50    Types: Cigarettes    Quit date: 10/25/1980    Years since quitting: 38.2   Smokeless tobacco: Never Used  Substance Use Topics   Alcohol use: Yes    Comment: rarely   Drug use: Not Currently    Types: "Crack" cocaine, Marijuana    Comment: 08/21/2016 "clean since 05/1998"    Review of Systems  Review of Systems   Review of Systems  Constitutional: Positive for fatigue. Negative for chills and fever.  HENT: Negative for congestion and rhinorrhea.   Eyes: Negative for visual disturbance.  Respiratory: Negative for cough and shortness of breath.   Gastrointestinal: Negative for abdominal pain, diarrhea, nausea and vomiting.  Genitourinary: Negative for dysuria and flank pain.  Skin: Positive for rash and wound.  Neurological: Negative for weakness and light-headedness.  All other systems reviewed and are negative. ____________________________________________  PHYSICAL EXAM:      VITAL SIGNS: ED Triage Vitals  Enc Vitals Group     BP 12/28/18 1336 135/63     Pulse Rate 12/28/18 1336 82     Resp 12/28/18 1336 16     Temp 12/28/18 1336 98.3 F (36.8 C)     Temp Source 12/28/18 1336 Oral     SpO2 12/28/18 1336 93 %     Weight 12/28/18 1334 289 lb 0.4 oz (131.1 kg)     Height --      Head Circumference --      Peak Flow --      Pain Score 12/28/18 1333 10     Pain Loc --      Pain Edu? --      Excl. in [place]? --      Physical Exam   Physical Exam Vitals signs and nursing note reviewed.  Constitutional:      General: He is not in acute distress.    Appearance: He is well-developed.  HENT:     Head: Normocephalic and atraumatic.  Eyes:     Conjunctiva/sclera: Conjunctivae normal.  Neck:     Musculoskeletal: Neck supple.  Cardiovascular:     Rate and Rhythm: Normal rate and regular rhythm.     Heart sounds: Normal heart sounds. No murmur. No friction rub.   Pulmonary:     Effort: Pulmonary effort is normal. No respiratory distress.     Breath sounds: Rales (Mild, bibasilar) present. No wheezing.     Comments: Normal work of breathing.   Abdominal:     General: There is no distension.     Palpations: Abdomen is soft.     Tenderness: There is no abdominal tenderness.     Comments: Obese  Skin:    General: Skin is warm.     Capillary Refill: Capillary refill takes less than 2 seconds.  Neurological:     Mental Status: He is alert and oriented to person, place, and time.     Motor: No abnormal muscle tone.      UPPER EXTREMITY EXAM: Left  INSPECTION & PALPATION: Large, open, foul-smelling wound to the left upper extremity with deep wound cavity extending to the deep tissues.  There is purulent and fibrinous eschar along the wound edges.  Tissue appears viable, but not particularly blanching or bleeding.  Moderate surrounding erythema, warmth, and tenderness.  No crepitance.  SENSORY: Sensation  is intact to light touch in:  Superficial radial nerve distribution (dorsal first web space) Median nerve distribution (tip of index finger)   Ulnar nerve distribution (tip of small finger)     MOTOR:  + Motor posterior interosseous nerve (thumb IP extension) + Anterior interosseous nerve (thumb IP flexion, index finger DIP flexion) + Radial nerve (wrist extension) + Median nerve (palpable firing thenar mass) + Ulnar nerve (palpable firing of first dorsal interosseous muscle)  VASCULAR: 2+ radial pulse Brisk capillary refill < 2 sec, fingers warm and well-perfused         ____________________________________________   LABS (all labs ordered are listed, but only abnormal results are displayed)  Labs Reviewed  COMPREHENSIVE METABOLIC PANEL - Abnormal; Notable for the following components:      Result Value   Sodium 131 (*)    Chloride 91 (*)    Glucose, Bld 565 (*)    BUN 44 (*)    Creatinine, Ser 5.74 (*)    Calcium 7.9 (*)     Albumin 3.2 (*)    Alkaline Phosphatase 287 (*)    GFR calc non Af Amer 7 (*)    GFR calc Af Amer 9 (*)    All other components within normal limits  CBC WITH DIFFERENTIAL/PLATELET - Abnormal; Notable for the following components:   RBC 3.34 (*)    Hemoglobin 9.9 (*)    HCT 31.4 (*)    RDW 16.3 (*)    Abs Immature Granulocytes 0.10 (*)    All other components within normal limits  CULTURE, BLOOD (ROUTINE X 2)  CULTURE, BLOOD (ROUTINE X 2)  SARS CORONAVIRUS 2 (HOSPITAL ORDER, [place] [facility])  LACTIC ACID, PLASMA  LACTIC ACID, PLASMA  URINALYSIS, COMPLETE (UACMP) WITH MICROSCOPIC  SEDIMENTATION RATE  C-REACTIVE PROTEIN    ____________________________________________  EKG: None ________________________________________  RADIOLOGY All imaging, including plain films, CT scans, and ultrasounds, independently reviewed by me, and interpretations confirmed via formal radiology reads.  ED MD interpretation:   None indicated  Official radiology report(s): No results found.  ____________________________________________  PROCEDURES   Procedure(s) performed (including Critical Care):  Ultrasound ED Peripheral IV (Provider)  Date/Time: 12/28/2018 6:42 PM Performed by: Duffy Bruce, MD Authorized by: Duffy Bruce, MD   Procedure details:    Indications: multiple failed IV attempts and poor IV access     Skin Prep: chlorhexidine gluconate     Location:  Right forearm   Angiocath:  20 G   Bedside Ultrasound Guided: Yes     Images: archived     Patient tolerated procedure without complications: Yes     Dressing applied: Yes    .Critical Care Performed by: Duffy Bruce, MD Authorized by: Duffy Bruce, MD   Critical care provider statement:    Critical care time (minutes):  45   Critical care time was exclusive of:  Separately billable procedures and treating other patients and teaching time   Critical care was necessary to treat or  prevent imminent or life-threatening deterioration of the following conditions:  Cardiac failure, circulatory failure and sepsis   Critical care was time spent personally by me on the following activities:  Development of treatment plan with patient or surrogate, discussions with consultants, evaluation of patient's response to treatment, examination of patient, obtaining history from patient or surrogate, ordering and performing treatments and interventions, ordering and review of laboratory studies, ordering and review of radiographic studies, pulse oximetry, re-evaluation of patient's condition and review of old charts  I assumed direction of critical care for this patient from another provider in my specialty: no      ____________________________________________  INITIAL IMPRESSION / MDM / [place] / ED COURSE  As part of my medical decision making, I reviewed the following data within the electronic MEDICAL RECORD NUMBER Notes from prior ED visits and [place] Controlled Substance Database      *[name] was evaluated in Emergency Department on 12/28/2018 for the symptoms described in the history of present illness. She was evaluated in the context of the global COVID-19 pandemic, which necessitated consideration that the patient might be at risk for infection with the SARS-CoV-2 virus that causes COVID-19. Institutional protocols and algorithms that pertain to the evaluation of patients at risk for COVID-19 are in a state of rapid change based on information released by regulatory bodies including the CDC and federal and state organizations. These policies and algorithms were followed during the patient's care in the ED.  Some ED evaluations and interventions may be delayed as a result of limited staffing during the pandemic.*      Medical Decision Making: 63 year old female here with open, draining wound to left upper extremity.  Symptoms and exam is consistent with possible acute,  recurrent left upper arm wound infection.  Discussed with Dr. Delana Meyer of Vascular Surgery who last debrided the wound on 6/10. Will start on IV Unasyn per discussion with Pharmacy based on wound cultures from OR, admit for IV ABX. Of note, patient markedly hyperglycemic but with normal AG - likely reactive 2/2 infection. Not consistent with DKA. Will give insulin, monitor. Holding on fluids 2/2 ESRD status, no signs of severe sepsis. Wound re-dressed with sterile wet to dry for now. COVID pending. ____________________________________________  FINAL CLINICAL IMPRESSION(S) / ED DIAGNOSES  Final diagnoses:  Wound infection  ESRD (end stage renal disease) ([place])  Hyperglycemia     MEDICATIONS GIVEN DURING THIS VISIT:  Medications  sodium chloride flush (NS) 0.9 % injection 3 mL (has no administration in time range)  insulin aspart (novoLOG) injection 10 Units (has no administration in time range)  Ampicillin-Sulbactam (UNASYN) 3 g in sodium chloride 0.9 % 100 mL IVPB (has no administration in time range)     ED Discharge Orders    None       Note:  This document was prepared using Dragon voice recognition software and may include unintentional dictation errors.   Duffy Bruce, MD 12/28/18 1704    Duffy Bruce, MD 12/28/18 Karin Golden    Duffy Bruce, MD 01/11/19 [phone]

## 2018-12-28 NOTE — ED Notes (Signed)
MD states only need one culture set due to difficult stick

## 2018-12-28 NOTE — Progress Notes (Signed)
[facility]  Vascular Surgery Communication Note Consult received. Plan is to admit to medicine. Plan for debridement on OR Friday with Schnier Wet to dry dressings to wound for now. Full consult to follow. Discussed with Dr. Delana Meyer     Document Information  Photos    12/28/2018 16:42  Attached To:  Hospital Encounter on 12/28/18  Source Information  Dammon Makarewicz, Janalyn Harder, PA-C  [facility]-Emergency Department   Marcelle Overlie PA-C 12/28/2018 4:52 PM

## 2018-12-28 NOTE — ED Notes (Signed)
PT is sat up in bed and  eating dinner

## 2018-12-28 NOTE — Consult Note (Addendum)
Pharmacy Antibiotic Note  [name] is a 63 y.o. female admitted on 12/28/2018 with wound infection.  Pharmacy has been consulted for Unasyn dosing.  ESRD Pt currently on T, Th, Sat dialyses.  Plan: Will dose Unasyn 3g q 12 hours  (CrCl borderline - may consider decreasing dosing to q24h if deemed appropriate)  Weight: 289 lb 0.4 oz (131.1 kg)  Temp (24hrs), Avg:98.3 F (36.8 C), Min:98.3 F (36.8 C), Max:98.3 F (36.8 C)  Recent Labs  Lab 12/28/18 1345 12/28/18 1740  WBC 8.4  --   CREATININE 5.74*  --   LATICACIDVEN 1.8 1.6    Estimated Creatinine Clearance: 14.9 mL/min (A) (by C-G formula based on SCr of 5.74 mg/dL (H)).    Allergies  Allergen Reactions  . Shellfish Allergy Anaphylaxis and Swelling  . Diazepam Other (See Comments)    "felt like out of body experience"    Antimicrobials this admission: Unasyn 6/17 >>  Dose adjustments this admission: None  Microbiology results: 6/17 BCx: pending  COVID NEG   Thank you for allowing pharmacy to be a part of this patient's care.  Lu Duffel, PharmD, BCPS Clinical Pharmacist 12/28/2018 7:12 PM

## 2018-12-28 NOTE — ED Notes (Signed)
Pt c/o pain, refusing tylenol that admitting MD has ordered for pain. Pt requesting something else for pain. Will contact admitting MD.

## 2018-12-28 NOTE — ED Notes (Signed)
ED TO INPATIENT HANDOFF REPORT  ED Nurse Name and Phone #: Deldrick Linch [phone] S Name/Age/Gender Heather [id number].[id number]. female Room/Bed: ED03A/ED03A  Code Status   Code Status: Full Code  Home/SNF/Other Home Patient oriented to: self, place, time and situation Is this baseline? Yes   Triage Complete: Triage complete  Chief Complaint wound check, left arm  Triage Note Patient had surgery to left upper arm last Wednesday for AV Shunt.  Seen by a nurse at home to manage a wound vac.  Patient states after two home visits by nurse, left arm started hurting.  States left arm started to swell, painful, and cool to touch on Saturday at dialysis.  STates she took wound vac off and has been doing wet to dry dressings since Saturday.  States arm continues to hurt.  [facility]  Arrives to ED today for wound to be checked and to manage pain.   Allergies Allergies  Allergen Reactions  . Shellfish Allergy Anaphylaxis and Swelling  . Diazepam Other (See Comments)    "felt like out of body experience"    Level of Care/Admitting Diagnosis ED Disposition    [id number]  Level of Care: Med-Surg [16]  Covid Evaluation: Confirmed COVID Negative  Diagnosis: Non-healing wound of upper extremity, left, subsequent encounter [[[id number]] Admitting Physician: MChristel Mormon[[[id number]] Attending Physician: MChristel Mormon[[[id number]] Estimated length of stay: past midnight tomorrow  Certification:: I certify this patient will need inpatient services for at least 2 midnights  PT Class (Do Not Modify): Inpatient [101]  PT Acc Code (Do Not Modify): Private [1]       B Medical/Surgery History Past Medical History:  Diagnosis Date  . Anemia   . Aortic stenosis    Echo 8/18: mean 13, peak 28, LVOT/AV mean velocity 0.51  . Arthritis   . Asthma    As a child   . Bronchitis   . CAD (coronary artery disease)    a. 09/2016: 50% Ost 1st Mrg stenosis, 50% 2nd Mrg stenosis, 20% Mid-Cx, 95% Prox LAD, 40% mid-LAD, and 10% dist-LAD stenosis. Staged PCI with DES to Prox-LAD.   .Marland KitchenChronic combined systolic and diastolic CHF (congestive heart failure) ([place]   echo 2/18: EF 55-60, normal wall motion, grade 2 diastolic dysfunction, trivial AI // echo 3/18: Septal and apical HK, EF 45-50, normal wall motion, trivial AI, mild LAE, PASP 38 // echo 8/18: EF 60-65, normal wall motion, grade 1 diastolic dysfunction, calcified aortic valve leaflets, mild aortic stenosis (mean 13, peak 28, LVOT/AV mean velocity 0.51), mild AI, moderate MAC, mild LAE, trivial TR   . Chronic kidney disease    STAGE 4  . Chronic kidney disease on chronic dialysis (HCC)    t, th, sat  . Complication of anesthesia   . Depression   . Diabetes mellitus Dx 1989  . Elevated lipids   . GERD (gastroesophageal reflux disease)   . Gout   . Heart murmur    asymptomatic  . Hepatitis C Dx 2013  . Hypertension Dx 1989  . Infected surgical wound    Lt arm  . [place] infarction ([place]  . Obesity   . Pancreatitis 2013  . Pneumonia   . Refusal of blood transfusions as patient is Jehovah's Witness   . Tendinitis   . Tremors of nervous system    LEFT HAND  .  Ulcer 2010   Past Surgical History:  Procedure Laterality Date  . APPLICATION OF WOUND VAC Left 08/14/2017   Procedure: APPLICATION OF WOUND VAC Exchange;  Surgeon: Robert Bellow, MD;  Location: [facility] ORS;  Service: General;  Laterality: Left;  . APPLICATION OF WOUND VAC Left 12/21/2018   Procedure: APPLICATION OF WOUND VAC;  Surgeon: Katha Cabal, MD;  Location: [facility] ORS;  Service: Vascular;  Laterality: Left;  . AV FISTULA PLACEMENT Left 08/19/2018   Procedure: ARTERIOVENOUS (AV) FISTULA CREATION ( BRACHIOBASILIC );  Surgeon: Katha Cabal, MD;  Location: [facility] ORS;  Service: Vascular;  Laterality: Left;  . BASCILIC VEIN TRANSPOSITION Left 11/18/2018   Procedure:  BASCILIC VEIN TRANSPOSITION;  Surgeon: Katha Cabal, MD;  Location: [facility] ORS;  Service: Vascular;  Laterality: Left;  . CHOLECYSTECTOMY    . COLONOSCOPY WITH PROPOFOL N/A 02/03/2018   Procedure: COLONOSCOPY WITH PROPOFOL;  Surgeon: Lin Landsman, MD;  Location: [facility] ENDOSCOPY;  Service: Gastroenterology;  Laterality: N/A;  . CORONARY ANGIOPLASTY  07/2015   STENT  . CORONARY STENT INTERVENTION N/A 09/18/2016   Procedure: Coronary Stent Intervention;  Surgeon: Troy Sine, MD;  Location: [place] CV LAB;  Service: Cardiovascular;  Laterality: N/A;  . DIALYSIS/PERMA CATHETER INSERTION N/A 05/10/2018   Procedure: DIALYSIS/PERMA CATHETER INSERTION;  Surgeon: Katha Cabal, MD;  Location: [place] CV LAB;  Service: Cardiovascular;  Laterality: N/A;  . DRESSING CHANGE UNDER ANESTHESIA Left 08/15/2017   Procedure: exploration of wound for bleeding;  Surgeon: Robert Bellow, MD;  Location: [facility] ORS;  Service: General;  Laterality: Left;  . ESOPHAGOGASTRODUODENOSCOPY (EGD) WITH PROPOFOL N/A 02/03/2018   Procedure: ESOPHAGOGASTRODUODENOSCOPY (EGD) WITH PROPOFOL;  Surgeon: Lin Landsman, MD;  Location: [facility] ENDOSCOPY;  Service: Gastroenterology;  Laterality: N/A;  . EYE SURGERY  11/17/2018  . INCISION AND DRAINAGE ABSCESS Left 08/12/2017   Procedure: INCISION AND DRAINAGE ABSCESS;  Surgeon: Robert Bellow, MD;  Location: [facility] ORS;  Service: General;  Laterality: Left;  . KNEE ARTHROSCOPY    . LEFT HEART CATH N/A 09/18/2016   Procedure: Left Heart Cath;  Surgeon: Troy Sine, MD;  Location: [place] CV LAB;  Service: Cardiovascular;  Laterality: N/A;  . LEFT HEART CATH AND CORONARY ANGIOGRAPHY N/A 09/16/2016   Procedure: Left Heart Cath and Coronary Angiography;  Surgeon: Burnell Blanks, MD;  Location: [place] CV LAB;  Service: Cardiovascular;  Laterality: N/A;  . LEFT HEART CATH AND CORONARY ANGIOGRAPHY N/A 04/29/2017   Procedure: LEFT HEART CATH AND  CORONARY ANGIOGRAPHY;  Surgeon: Nelva Bush, MD;  Location: [place] CV LAB;  Service: Cardiovascular;  Laterality: N/A;  . LOWER EXTREMITY ANGIOGRAPHY Right 03/08/2018   Procedure: LOWER EXTREMITY ANGIOGRAPHY;  Surgeon: Katha Cabal, MD;  Location: [place] CV LAB;  Service: Cardiovascular;  Laterality: Right;  . TUBAL LIGATION    . TUBAL LIGATION    . WOUND DEBRIDEMENT Left 12/21/2018   Procedure: DEBRIDEMENT WOUND;  Surgeon: Katha Cabal, MD;  Location: [facility] ORS;  Service: Vascular;  Laterality: Left;     A IV Location/Drains/Wounds Patient Lines/Drains/Airways Status   Active Line/Drains/Airways    Name:   Placement date:   Placement time:   Site:   Days:   Peripheral IV 12/18/18 Right Antecubital   12/18/18    1821    Antecubital   10   Peripheral IV 12/28/18 Right Forearm   12/28/18    1746    Forearm   less than 1  Fistula / Graft Left Upper arm Arteriovenous fistula   08/19/18    0911    Upper arm   131   Hemodialysis Catheter Right Subclavian Double-lumen   -    -    Subclavian      Negative Pressure Wound Therapy Arm Left   12/21/18    1231    -   7   Incision (Closed) 08/19/18 Arm Left   08/19/18    0922     131   Incision (Closed) 11/18/18 Arm Left   11/18/18    1100     40   Incision (Closed) 12/21/18 Arm Left   12/21/18    1232     7          Intake/Output Last 24 hours No intake or output data in the 24 hours ending 12/28/18 2056  Labs/Imaging Results for orders placed or performed during the hospital encounter of 12/28/18 (from the past 48 hour(s))  Lactic acid, plasma     Status: None   Collection Time: 12/28/18  1:45 PM  Result Value Ref Range   Lactic Acid, Venous 1.8 0.5 - 1.9 mmol/L    Comment: Performed at [facility], [address] [place], [place] [address]  Comprehensive metabolic panel     Status: Abnormal   Collection Time: 12/28/18  1:45 PM  Result Value Ref Range   Sodium 131 (L) 135 - 145 mmol/L   Potassium  4.4 3.5 - 5.1 mmol/L   Chloride 91 (L) 98 - 111 mmol/L   CO2 26 22 - 32 mmol/L   Glucose, Bld 565 (HH) 70 - 99 mg/dL    Comment: CRITICAL RESULT CALLED TO, READ BACK BY AND VERIFIED WITH STEPHANIE RUDD _0  ON 12/28/2018 BY FMW    BUN 44 (H) 6 - 20 mg/dL   Creatinine, Ser 5.74 (H) 0.44 - 1.00 mg/dL   Calcium 7.9 (L) 8.9 - 10.3 mg/dL   Total Protein 7.4 6.5 - 8.1 g/dL   Albumin 3.2 (L) 3.5 - 5.0 g/dL   AST 24 15 - 41 U/L   ALT 23 0 - 44 U/L   Alkaline Phosphatase 287 (H) 38 - 126 U/L   Total Bilirubin 0.4 0.3 - 1.2 mg/dL   GFR calc non Af Amer 7 (L) >60 mL/min   GFR calc Af Amer 9 (L) >60 mL/min   Anion gap 14 5 - 15    Comment: Performed at [facility], [address] [place], [place] [address]  CBC with Differential     Status: Abnormal   Collection Time: 12/28/18  1:45 PM  Result Value Ref Range   WBC 8.4 4.0 - 10.5 K/uL   RBC 3.34 (L) 3.87 - 5.11 MIL/uL   Hemoglobin 9.9 (L) 12.0 - 15.0 g/dL   HCT 31.4 (L) 36.0 - 46.0 %   MCV 94.0 80.0 - 100.0 fL   MCH 29.6 26.0 - 34.0 pg   MCHC 31.5 30.0 - 36.0 g/dL   RDW 16.3 (H) 11.5 - 15.5 %   Platelets 189 150 - 400 K/uL   nRBC 0.0 0.0 - 0.2 %   Neutrophils Relative % 72 %   Neutro Abs 6.1 1.7 - 7.7 K/uL   Lymphocytes Relative 15 %   Lymphs Abs 1.2 0.7 - 4.0 K/uL   Monocytes Relative 10 %   Monocytes Absolute 0.8 0.1 - 1.0 K/uL   Eosinophils Relative 2 %   Eosinophils Absolute 0.2 0.0 - 0.5 K/uL  Basophils Relative 0 %   Basophils Absolute 0.0 0.0 - 0.1 K/uL   Immature Granulocytes 1 %   Abs Immature Granulocytes 0.10 (H) 0.00 - 0.07 K/uL    Comment: Performed at [facility], [address] [place], [place] [address]  SARS Coronavirus 2 (CEPHEID - Performed in [place] hospital lab), Hosp Order     Status: None   Collection Time: 12/28/18  5:22 PM   Specimen: Nasopharyngeal Swab  Result Value Ref Range   SARS Coronavirus 2 NEGATIVE NEGATIVE    Comment: (NOTE) If result is NEGATIVE SARS-CoV-2  target nucleic acids are NOT DETECTED. The SARS-CoV-2 RNA is generally detectable in upper and lower  respiratory specimens during the acute phase of infection. The lowest  concentration of SARS-CoV-2 viral copies this assay can detect is 250  copies / mL. A negative result does not preclude SARS-CoV-2 infection  and should not be used as the sole basis for treatment or other  patient management decisions.  A negative result may occur with  improper specimen collection / handling, submission of specimen other  than nasopharyngeal swab, presence of viral mutation(s) within the  areas targeted by this assay, and inadequate number of viral copies  (<250 copies / mL). A negative result must be combined with clinical  observations, patient history, and epidemiological information. If result is POSITIVE SARS-CoV-2 target nucleic acids are DETECTED. The SARS-CoV-2 RNA is generally detectable in upper and lower  respiratory specimens dur ing the acute phase of infection.  Positive  results are indicative of active infection with SARS-CoV-2.  Clinical  correlation with patient history and other diagnostic information is  necessary to determine patient infection status.  Positive results do  not rule out bacterial infection or co-infection with other viruses. If result is PRESUMPTIVE POSTIVE SARS-CoV-2 nucleic acids MAY BE PRESENT.   A presumptive positive result was obtained on the submitted specimen  and confirmed on repeat testing.  While 2019 novel coronavirus  (SARS-CoV-2) nucleic acids may be present in the submitted sample  additional confirmatory testing may be necessary for epidemiological  and / or clinical management purposes  to differentiate between  SARS-CoV-2 and other Sarbecovirus currently known to infect humans.  If clinically indicated additional testing with an alternate test  methodology [id number]) is advised. The SARS-CoV-2 RNA is generally  detectable in upper and lower  respiratory sp ecimens during the acute  phase of infection. The expected result is Negative. Fact Sheet for Patients:  StrictlyIdeas.no Fact Sheet for Healthcare Providers: BankingDealers.co.za This test is not yet approved or cleared by the [place] FDA and has been authorized for detection and/or diagnosis of SARS-CoV-2 by FDA under an Emergency Use Authorization (EUA).  This EUA will remain in effect (meaning this test can be used) for the duration of the COVID-19 declaration under Section 564(b)(1) of the Act, 21 U.S.C. section 360bbb-3(b)(1), unless the authorization is terminated or revoked sooner. Performed at [facility], [address] [place], [place] [address]   Lactic acid, plasma     Status: None   Collection Time: 12/28/18  5:40 PM  Result Value Ref Range   Lactic Acid, Venous 1.6 0.5 - 1.9 mmol/L    Comment: Performed at [facility], [address] [place], [place] [address]  Sedimentation rate     Status: Abnormal   Collection Time: 12/28/18  5:40 PM  Result Value Ref Range   Sed Rate 82 (H) 0 - 30 mm/hr  Comment: Performed at [facility] [address] [place], [place] [address]  C-reactive protein     Status: Abnormal   Collection Time: 12/28/18  5:40 PM  Result Value Ref Range   CRP 1.1 (H) <1.0 mg/dL    Comment: Performed at [facility], [address], [place], [place] [address]   No results found.  Pending Labs Unresulted Labs (From admission, onward)    Start     Ordered   12/29/18 1941  Basic metabolic panel  Tomorrow morning,   STAT     12/28/18 2041   12/29/18 0500  CBC  Tomorrow morning,   STAT     12/28/18 2041   12/28/18 2042  HIV antibody (Routine Testing)  Once,   STAT     12/28/18 2041   12/28/18 2042  CBC  (heparin)  Once,   STAT    Comments: Baseline for heparin therapy IF NOT ALREADY DRAWN.  Notify MD if PLT < 100 K.    12/28/18 2041    12/28/18 2042  Creatinine, serum  (heparin)  Once,   STAT    Comments: Baseline for heparin therapy IF NOT ALREADY DRAWN.    12/28/18 2041   12/28/18 2042  TSH  Once,   STAT     12/28/18 2041   12/28/18 2042  Hemoglobin A1c  Once,   STAT     12/28/18 2041   12/28/18 1607  Blood culture (routine x 2)  BLOOD CULTURE X 2,   STAT     12/28/18 1606   12/28/18 1345  Urinalysis, Complete w Microscopic  ONCE - STAT,   STAT     12/28/18 1344          Vitals/Pain Today's Vitals   12/28/18 1630 12/28/18 1830 12/28/18 1900 12/28/18 1946  BP: 134/67 136/72 122/62 139/61  Pulse: 81 81 77 79  Resp:    18  Temp:      TempSrc:      SpO2: 93% 91% 93% 97%  Weight:      PainSc:        Isolation Precautions No active isolations  Medications Medications  sodium chloride flush (NS) 0.9 % injection 3 mL (has no administration in time range)  Ampicillin-Sulbactam (UNASYN) 3 g in sodium chloride 0.9 % 100 mL IVPB (0 g Intravenous Stopped 12/28/18 1904)  heparin injection 5,000 Units (has no administration in time range)  sodium chloride flush (NS) 0.9 % injection 3 mL (has no administration in time range)  sodium chloride flush (NS) 0.9 % injection 3 mL (has no administration in time range)  sodium chloride flush (NS) 0.9 % injection 3 mL (has no administration in time range)  0.9 %  sodium chloride infusion (has no administration in time range)  acetaminophen (TYLENOL) tablet 650 mg (has no administration in time range)    Or  acetaminophen (TYLENOL) suppository 650 mg (has no administration in time range)  polyethylene glycol (MIRALAX / GLYCOLAX) packet 17 g (has no administration in time range)  ondansetron (ZOFRAN) tablet 4 mg (has no administration in time range)    Or  ondansetron (ZOFRAN) injection 4 mg (has no administration in time range)  insulin aspart (novoLOG) injection 0-5 Units (has no administration in time range)  insulin aspart (novoLOG) injection 0-15 Units (has no  administration in time range)  allopurinol (ZYLOPRIM) tablet 100 mg (has no administration in time range)  aspirin EC tablet 81 mg (has no administration in time range)  atorvastatin (LIPITOR) tablet 80 mg (has no administration in time range)  carvedilol (COREG) tablet 25 mg (has no administration in time range)  docusate sodium (COLACE) capsule 100 mg (has no administration in time range)  hydrALAZINE (APRESOLINE) tablet 100 mg (has no administration in time range)  liraglutide (VICTOZA) SOPN 0.6 mg (has no administration in time range)  loratadine (CLARITIN) tablet 10 mg (has no administration in time range)  mometasone-formoterol (DULERA) 200-5 MCG/ACT inhaler 2 puff (has no administration in time range)  montelukast (SINGULAIR) tablet 10 mg (has no administration in time range)  insulin aspart (NOVOLOG) FlexPen 30 Units (has no administration in time range)  pantoprazole (PROTONIX) EC tablet 40 mg (has no administration in time range)  pregabalin (LYRICA) capsule 75 mg (has no administration in time range)  senna-docusate (Senokot-S) tablet 1 tablet (has no administration in time range)  torsemide (DEMADEX) tablet 40 mg (has no administration in time range)  valACYclovir (VALTREX) tablet 500 mg (has no administration in time range)  Vitamin D (Ergocalciferol) (DRISDOL) capsule 50,000 Units (has no administration in time range)  ferric citrate (AURYXIA) tablet 420 mg (has no administration in time range)  HYDROcodone-acetaminophen (NORCO) 7.5-325 MG per tablet 1 tablet (has no administration in time range)  insulin aspart (novoLOG) injection 10 Units (10 Units Intravenous Given 12/28/18 1802)  HYDROmorphone (DILAUDID) injection 0.5 mg (0.5 mg Intravenous Given 12/28/18 1759)    Mobility uta Low fall risk   Focused Assessments     R Recommendations: See Admitting Provider Note  Report given to:   Additional Notes: pt currently experiencing pain, provider contacted and order for  norco obtained, waiting on pharmacy verification

## 2018-12-29 ENCOUNTER — Inpatient Hospital Stay: Payer: Medicare Other

## 2018-12-29 DIAGNOSIS — T8131XA Disruption of external operation (surgical) wound, not elsewhere classified, initial encounter: Principal | ICD-10-CM

## 2018-12-29 LAB — BASIC METABOLIC PANEL
Anion gap: 15 (ref 5–15)
BUN: 51 mg/dL — ABNORMAL HIGH (ref 6–20)
CO2: 23 mmol/L (ref 22–32)
Calcium: 7.8 mg/dL — ABNORMAL LOW (ref 8.9–10.3)
Chloride: 91 mmol/L — ABNORMAL LOW (ref 98–111)
Creatinine, Ser: 6.37 mg/dL — ABNORMAL HIGH (ref 0.44–1.00)
GFR calc Af Amer: 8 mL/min — ABNORMAL LOW (ref >60)
GFR calc non Af Amer: 7 mL/min — ABNORMAL LOW (ref >60)
Glucose, Bld: 516 mg/dL (ref 70–99)
Potassium: 4.3 mmol/L (ref 3.5–5.1)
Sodium: 129 mmol/L — ABNORMAL LOW (ref 135–145)

## 2018-12-29 LAB — HEMOGLOBIN A1C
Hgb A1c MFr Bld: 9.6 % — ABNORMAL HIGH (ref 4.8–5.6)
Mean Plasma Glucose: 228.82 mg/dL

## 2018-12-29 LAB — GLUCOSE, CAPILLARY
Glucose-Capillary: 378 mg/dL — ABNORMAL HIGH (ref 70–99)
Glucose-Capillary: 352 mg/dL — ABNORMAL HIGH (ref 70–99)
Glucose-Capillary: 421 mg/dL — ABNORMAL HIGH (ref 70–99)

## 2018-12-29 LAB — URINALYSIS, COMPLETE (UACMP) WITH MICROSCOPIC
Bacteria, UA: NONE SEEN
Bilirubin Urine: NEGATIVE
Glucose, UA: 50 mg/dL — AB
Hgb urine dipstick: NEGATIVE
Ketones, ur: NEGATIVE mg/dL
Leukocytes,Ua: NEGATIVE
Nitrite: NEGATIVE
Protein, ur: NEGATIVE mg/dL
Specific Gravity, Urine: 1.02 (ref 1.005–1.030)
pH: 5 (ref 5.0–8.0)

## 2018-12-29 LAB — CBC
HCT: 29.8 % — ABNORMAL LOW (ref 36.0–46.0)
Hemoglobin: 9.3 g/dL — ABNORMAL LOW (ref 12.0–15.0)
MCH: 29.3 pg (ref 26.0–34.0)
MCHC: 31.2 g/dL (ref 30.0–36.0)
MCV: 94 fL (ref 80.0–100.0)
Platelets: 201 10*3/uL (ref 150–400)
RBC: 3.17 MIL/uL — ABNORMAL LOW (ref 3.87–5.11)
RDW: 16.1 % — ABNORMAL HIGH (ref 11.5–15.5)
WBC: 7.7 10*3/uL (ref 4.0–10.5)
nRBC: 0 % (ref 0.0–0.2)

## 2018-12-29 LAB — MRSA PCR SCREENING: MRSA by PCR: NEGATIVE

## 2018-12-29 MED ORDER — CHLORHEXIDINE GLUCONATE CLOTH 2 % EX PADS
6.0000 | MEDICATED_PAD | Freq: Every day | CUTANEOUS | Status: DC
  Administered 2018-12-29 – 2019-01-01 (×4): 6 via TOPICAL

## 2018-12-29 MED ORDER — HYDROXYZINE HCL 25 MG PO TABS
25.0000 mg | ORAL_TABLET | Freq: Four times a day (QID) | ORAL | Status: DC | PRN
  Administered 2018-12-30 – 2019-01-02 (×4): 25 mg via ORAL
  Filled 2018-12-29 (×5): qty 1

## 2018-12-29 MED ORDER — SODIUM CHLORIDE 0.9 % IV SOLN
3.0000 g | INTRAVENOUS | Status: DC
  Administered 2018-12-29 – 2019-01-01 (×4): 3 g via INTRAVENOUS
  Filled 2018-12-29 (×6): qty 3

## 2018-12-29 MED ORDER — MORPHINE SULFATE (PF) 2 MG/ML IV SOLN
1.0000 mg | INTRAVENOUS | Status: DC | PRN
  Administered 2018-12-29 – 2019-01-02 (×16): 2 mg via INTRAVENOUS
  Filled 2018-12-29 (×17): qty 1

## 2018-12-29 MED ORDER — INSULIN ASPART 100 UNIT/ML ~~LOC~~ SOLN
20.0000 [IU] | Freq: Once | SUBCUTANEOUS | Status: AC
  Administered 2018-12-29: 20 [IU] via SUBCUTANEOUS
  Filled 2018-12-29: qty 1

## 2018-12-29 MED ORDER — EPOETIN ALFA 10000 UNIT/ML IJ SOLN
10000.0000 [IU] | INTRAMUSCULAR | Status: DC
  Administered 2018-12-29 – 2018-12-31 (×2): 10000 [IU] via INTRAVENOUS

## 2018-12-29 MED ORDER — INSULIN DETEMIR 100 UNIT/ML ~~LOC~~ SOLN
30.0000 [IU] | Freq: Two times a day (BID) | SUBCUTANEOUS | Status: DC
  Administered 2018-12-29 – 2018-12-30 (×3): 30 [IU] via SUBCUTANEOUS
  Filled 2018-12-29 (×4): qty 0.3

## 2018-12-29 NOTE — Progress Notes (Signed)
HD Tx Start    12/29/18 1430  Vital Signs  Temp 99.4 F (37.4 C)  Temp Source Oral  Pulse Rate 78  Pulse Rate Source Monitor  Resp 18  BP 119/69  BP Location Right Arm  BP Method Automatic  Patient Position (if appropriate) Sitting  Oxygen Therapy  SpO2 98 %  O2 Device Room Air  Pain Assessment  Pain Scale 0-10  Pain Score 8  Pain Type Acute pain  Pain Location Arm  Pain Orientation Left (from infected AV fistula)  Pain Intervention(s) Medication (See eMAR);RN made aware;Relaxation  Dialysis Weight  Weight 135.4 kg  Type of Weight Pre-Dialysis  Time-Out for Hemodialysis  What Procedure? Hemodialysis  Pt Identifiers(min of two) First/Last Name;MRN/Account#  Correct Site? Yes  Correct Side? Yes  Correct Procedure? Yes  Consents Verified? Yes  Rad Studies Available? N/A  Safety Precautions Reviewed? Yes  Engineer, civil (consulting) Number 1  Station Number 1  UF/Alarm Test Passed  Conductivity: Meter 14  Conductivity: Machine  14  pH 7  Reverse Osmosis Main  Normal Saline Lot Number [id number]  Dialyzer Lot Number [id number]  Disposable Set Lot Number [id number]  Machine Temperature 98.6 F (37 C)  Musician and Audible Yes  Blood Lines Intact and Secured Yes  Pre Treatment Patient Checks  Vascular access used during treatment Catheter  HD catheter dressing before treatment WDL  Hepatitis B Surface Antigen Results Negative  Date Hepatitis B Surface Antigen Drawn 11/30/18  Hepatitis B Surface Antibody  (>10)  Date Hepatitis B Surface Antibody Drawn 10/20/18  Hemodialysis Consent Verified Yes  Hemodialysis Standing Orders Initiated Yes  ECG (Telemetry) Monitor On Yes  Prime Ordered Normal Saline  Length of  DialysisTreatment -hour(s) 3 Hour(s)  Dialysis Treatment Comments Na 140  Dialyzer Elisio 17H NR  Dialysate 2K;2.5 Ca  Dialysate Flow Ordered 600  Blood Flow Rate Ordered 400 mL/min  Ultrafiltration Goal 2 Liters  Dialysis Blood Pressure Support Ordered  Normal Saline  During Hemodialysis Assessment  Blood Flow Rate (mL/min) 400 mL/min  Arterial Pressure (mmHg) -140 mmHg  Venous Pressure (mmHg) 120 mmHg  Transmembrane Pressure (mmHg) 50 mmHg  Ultrafiltration Rate (mL/min) 860 mL/min  Dialysate Flow Rate (mL/min) 600 ml/min  Conductivity: Machine  14  HD Safety Checks Performed Yes  Dialysis Fluid Bolus Normal Saline  Bolus Amount (mL) 250 mL  Intra-Hemodialysis Comments Tx initiated  Education / Care Plan  Dialysis Education Provided Yes  Documented Education in Care Plan Yes  Hemodialysis Catheter Right Subclavian Double-lumen  Placement Date/Time: (c) (c)   Placed prior to admission: Yes  Orientation: Right  Access Location: Subclavian  Hemodialysis Catheter Type: Double-lumen  Site Condition No complications  Blue Lumen Status Infusing  Red Lumen Status Infusing  Dressing Type Gauze/Drain sponge  Dressing Status Clean;Dry;Intact  Drainage Description None

## 2018-12-29 NOTE — Progress Notes (Signed)
Post HD Assessment  Tolerated well, received epogen, morphine for pain at 1710, and right chest CVC dressing changed. 2 liters of fluid removed. Pt requesting dressing change of AVF, which was relayed to RN upstairs as we do not have supplies for that type of dressing change in the dialysis unit.    12/29/18 1816  Neurological  Level of Consciousness Alert  Orientation Level Oriented X4  Respiratory  Respiratory Pattern Regular  Chest Assessment Chest expansion symmetrical  Bilateral Breath Sounds Diminished  Cardiac  Pulse Regular  Heart Sounds S1, S2  ECG Monitor Yes  Cardiac Rhythm NSR  Vascular  R Radial Pulse +2  L Radial Pulse +2  Integumentary  Integumentary (WDL) X  Skin Color Appropriate for ethnicity  Skin Condition Dry  Skin Integrity Other (Comment);Excoriated (scratch marks)  Musculoskeletal  Musculoskeletal (WDL) X  Generalized Weakness Yes  Gastrointestinal  Bowel Sounds Assessment Active  GU Assessment  Genitourinary (WDL) X  Genitourinary Symptoms Oliguria

## 2018-12-29 NOTE — Progress Notes (Signed)
Inpatient Diabetes Program Recommendations  AACE/ADA: New Consensus Statement on Inpatient Glycemic Control (2015)  Target Ranges:  Prepandial:   less than 140 mg/dL      Peak postprandial:   less than 180 mg/dL (1-2 hours)      Critically ill patients:  140 - 180 mg/dL    Results for [name], [name] (MRN [id number]) as of 12/29/2018 09:03  Ref. Range 12/28/2018 23:18 12/29/2018 07:47  Glucose-Capillary Latest Ref Range: 70 - 99 mg/dL 534 (HH)  20 units NOVOLOG  378 (H)  35 units NOVOLOG     Admit with: Nonhealing left upper arm wound/ Initially had left AV fistula removed resulting in current nonhealing wound to her left upper arm--Underwent debridement with Dr.Schnier on 12/21/2018 with subsequent placement of wound VAC last Friday by home health care--Wound VAC was replaced 2 days ago, on Monday, However pt reports cyanotic discoloration of her left hand with severe pain at the wound site--Pt removed the wound VAC and continued to do wet-to-dry dressings to the area.  History: DM, CHF, ESRD  Home DM Meds: Victoza 1.8 mg Daily       Novolog 30 units TID with meals       Levemir 60 units BID  Current Orders: Novolog Moderate Correction Scale/ SSI (0-15 units) TID AC + HS     Novolog 20 units TID with meals       Current A1c in process.      MD- Note that per record review of PCP notes [name], [place] with [facility]), patient is supposed to be taking Levemir 60 units BID at home in addition to her Novolog and Victoza.   Please consider starting at least 50% home dose of Levemir:   Levemir 30 units BID--please start this AM      --Will follow patient during hospitalization--  Wyn Quaker RN, MSN, CDE Diabetes Coordinator Inpatient Glycemic Control Team Team Pager: [phone] (8a-5p)

## 2018-12-29 NOTE — Progress Notes (Signed)
[facility] Progress / Consult Note  Subjective: The patient is well-known to our service.  On 12/21/18: 1) Excisional debridement with drainage of a seroma left arm 2) VAC placement  Patient notes having "issues" with the VAC since placement s/p surgery.  The patient was receiving visiting nurse services for wound care/VAC management however was unhappy with the services provided.  States the visiting nurse was not applying the [facility] appropriately.  Stated after recent placement, her hand became cold and painful and she remove the VAC herself.  She contacted our office asking to have a new nurse sent however communication via/to our office resulted in cancellation of her services with [facility] home health.  Our office was in the process of securing new visiting home services when the patient decided to seek medical attention at [facility] emergency department for wound care.  The patient denies any fever, nausea vomiting.  Continues to complain of left upper extremity wound pain with minimal drainage.  Denies any hand pallor, paresthesia or pain at this time.  Vascular surgery was consulted by the emergency room Dr. Ellender Hose for further recommendations. Objective: Vitals:   12/28/18 2146 12/28/18 2222 12/29/18 0028 12/29/18 0430  BP: (!) 158/69 121/77  123/60  Pulse: 80 80  81  Resp: 17 20  18   Temp:  98.6 F (37 C)  98.6 F (37 C)  TempSrc:  Oral  Oral  SpO2: 97% 97%  98%  Weight:   135.4 kg   Height:   5\' 8"  (1.727 m)     Intake/Output Summary (Last 24 hours) at 12/29/2018 0934 Last data filed at 12/29/2018 0427 Gross per 24 hour  Intake 0 ml  Output 200 ml  Net -200 ml   Physical Exam: A&Ox3, NAD CV: RRR Pulmonary: CTA Bilaterally Abdomen: Soft, Nontender, Nondistended Vascular:  Left Upper Extremity: Incision with about 60% granulation tissue noted.  Some fibrinous exudate necrotic fat noted in wound.  Surrounding skin is healthy.   Minimal drainage.  No foul odor noted.  2+ radial pulse.  Bruit and thrill noted to access.  Motor/sensory to left hand.    Document Information  Photos    12/28/2018 16:42  Attached To:  Hospital Encounter on 12/28/18  Source Information  Trisha Morandi, Abbe [place]   [facility]Emer[facility]    Laboratory: CBC    Component Value Date/Time   WBC 7.7 12/29/2018 0233   HGB 9.3 (L) 12/29/2018 0233   HGB 8.9 (L) 01/10/2018 1124   HCT 29.8 (L) 12/29/2018 0233   HCT 28.2 (L) 01/10/2018 1124   PLT 201 12/29/2018 0233   PLT 285 01/10/2018 1124   BMET    Component Value Date/Time   NA 129 (L) 12/29/2018 0233   NA 142 01/27/2018 1353   K 4.3 12/29/2018 0233   CL 91 (L) 12/29/2018 0233   CO2 23 12/29/2018 0233   GLUCOSE 516 (HH) 12/29/2018 0233   BUN 51 (H) 12/29/2018 0233   BUN 82 (HH) 01/27/2018 1353   CREATININE 6.37 (H) 12/29/2018 0233   CREATININE 1.36 (H) 09/23/2016 1451   CALCIUM 7.8 (L) 12/29/2018 0233   GFRNONAA 7 (L) 12/29/2018 0233   GFRNONAA 43 (L) 09/23/2016 1451   GFRAA 8 (L) 12/29/2018 0233   GFRAA 49 (L) 09/23/2016 1451   Assessment/Planning: The patient is a 63 year old female with multiple medical issues including end-stage renal disease status post access creation with wound breakdown/dehiscence postop day #5 1) Overall, the wound is doing  well however could benefit from some debridement to improve surface area and allow for faster granulation tissue formation.  Will attempt to place a VAC again in the OR.  We will plan on this on Friday with Dr. Delana Meyer. 2) Patient will need visiting nurse services to assist with VAC changes / wound care - will place consult to social work / case management 3) Patient in agreement with plan.  4) Will pre-op  Discussed with Dr. Eber Hong [facility] PA-C 12/29/2018 9:34 AM

## 2018-12-29 NOTE — Progress Notes (Signed)
[place] [facility] [place] NAME: [name]    MR#:  [id number]  DATE OF BIRTH:  1956/01/08  SUBJECTIVE:   Patient admitted due to left upper extremity wound with pain.  Patient was recently hospitalized and underwent debridement of the left upper extremity wound with wound VAC placement but returns back due to severe pain in the left upper extremity.  Denies any other associated symptoms of chest pain shortness of breath nausea vomiting.  Is afebrile.  REVIEW OF SYSTEMS:    Review of Systems  Constitutional: Negative for chills and fever.  HENT: Negative for congestion and tinnitus.   Eyes: Negative for blurred vision and double vision.  Respiratory: Negative for cough, shortness of breath and wheezing.   Cardiovascular: Negative for chest pain, orthopnea and PND.  Gastrointestinal: Negative for abdominal pain, diarrhea, nausea and vomiting.  Genitourinary: Negative for dysuria and hematuria.  Neurological: Negative for dizziness, sensory change and focal weakness.  All other systems reviewed and are negative.   Nutrition: Renal/Carb modified Tolerating Diet: yes Tolerating PT: await eval.   DRUG ALLERGIES:   Allergies  Allergen Reactions  . Shellfish Allergy Anaphylaxis and Swelling  . Diazepam Other (See Comments)    "felt like out of body experience"    VITALS:  Blood pressure 121/63, pulse 77, temperature 99.3 F (37.4 C), temperature source Oral, resp. rate 18, height 5\' 8"  (1.727 m), weight 135.4 kg, SpO2 98 %.  PHYSICAL EXAMINATION:   Physical Exam  GENERAL:  63 y.o.-year-old patient lying in bed in no acute distress.  EYES: Pupils equal, round, reactive to light and accommodation. No scleral icterus. Extraocular muscles intact.  HEENT: Head atraumatic, normocephalic. Oropharynx and nasopharynx clear.  NECK:  Supple, no jugular venous distention. No thyroid enlargement, no tenderness.  LUNGS: Normal breath sounds  bilaterally, no wheezing, rales, rhonchi. No use of accessory muscles of respiration.  CARDIOVASCULAR: S1, S2 normal. No murmurs, rubs, or gallops.  ABDOMEN: Soft, nontender, nondistended. Bowel sounds present. No organomegaly or mass.  EXTREMITIES: No cyanosis, clubbing or edema b/l.   Left upper extremity dressing secondary to the wound with previous surgery from her AV fistula removal. NEUROLOGIC: Cranial nerves II through XII are intact. No focal Motor or sensory deficits b/l.   PSYCHIATRIC: The patient is alert and oriented x 3.  SKIN: No obvious rash, lesion, or ulcer.    LABORATORY PANEL:   CBC Recent Labs  Lab 12/29/18 0233  WBC 7.7  HGB 9.3*  HCT 29.8*  PLT 201   ------------------------------------------------------------------------------------------------------------------  Chemistries  Recent Labs  Lab 12/28/18 1345 12/29/18 0233  NA 131* 129*  K 4.4 4.3  CL 91* 91*  CO2 26 23  GLUCOSE 565* 516*  BUN 44* 51*  CREATININE 5.74* 6.37*  CALCIUM 7.9* 7.8*  AST 24  --   ALT 23  --   ALKPHOS 287*  --   BILITOT 0.4  --    ------------------------------------------------------------------------------------------------------------------  Cardiac Enzymes No results for input(s): TROPONINI in the last 168 hours. ------------------------------------------------------------------------------------------------------------------  RADIOLOGY:  Dg Chest Port 1 View  Result Date: 12/29/2018 CLINICAL DATA:  CHF EXAM: PORTABLE CHEST 1 VIEW COMPARISON:  05/09/2018 FINDINGS: Cardiomegaly and pulmonary vascular congestion noted. A RIGHT IJ central venous catheter is present with tip overlying the SUPERIOR cavoatrial junction. Mild RIGHT basilar atelectasis is identified. No definite airspace disease, pleural effusion or pneumothorax. No acute bony abnormalities are present. IMPRESSION: Cardiomegaly with pulmonary vascular congestion and mild RIGHT  basilar atelectasis.  Electronically Signed   By: Margarette Canada M.D.   On: 12/29/2018 08:27     ASSESSMENT AND PLAN:   63 year old female with past medical history of end-stage renal disease on hemodialysis, chronic combined systolic diastolic CHF, essential hypertension, GERD, depression, coronary artery disease who presented to the hospital due to left upper extremity wound.  1.  Nonhealing left upper extremity wound-patient was recently hospitalized and had removal of her AV fistula and also debridement of the wound with wound VAC placement.  Her symptoms got worse so she came back to the hospital. - Seen by vascular surgery and they plan on doing further debridement and possibly placing another wound VAC tomorrow. -Continue empiric IV Unasyn.  Continue local wound care.  Patient is clinically afebrile and hemodynamically stable.  2.  End-stage renal disease on hemodialysis-patient gets dialysis on Tuesday Thursday Saturday. - Patient has a right chest wall PermCath.  Continue dialysis as per nephrology.  3.  Diabetes type 2 with hyperglycemia- blood sugars were somewhat uncontrolled -Continue Levemir, NovoLog with meals and sliding scale insulin coverage.  4.  Essential hypertension-continue carvedilol, hydralazine.  5.  History of combined systolic diastolic CHF-clinically patient is not in congestive heart failure.  Continue fluid removal as per dialysis. -Continue carvedilol, torsemide  6.  Neuropathy-continue Lyrica.  7.  GERD-continue Protonix.  8.  Hyperlipidemia-continue atorvastatin.  9.  History of gout-no acute attack.  Continue allopurinol.  All the records are reviewed and case discussed with Care Management/Social Worker. Management plans discussed with the patient, family and they are in agreement.  CODE STATUS: Full code  DVT Prophylaxis: Hep. sQ  TOTAL TIME TAKING CARE OF THIS PATIENT: 30 minutes.   POSSIBLE D/C IN 2-3 DAYS, DEPENDING ON CLINICAL CONDITION.   Henreitta Leber  M.D on 12/29/2018 at 2:36 PM  Between 7am to 6pm - Pager - [phone]  After 6pm go to [email or web address] - Technical brewer  Hospitalists  Office  [phone]  CC: Primary care physician; [name], PA-C

## 2018-12-29 NOTE — Progress Notes (Signed)
HD Tx End  Tolerated well, received epogen, morphine for pain at 1710, and right chest CVC dressing changed. 2 liters of fluid removed. Pt requesting dressing change of AVF, which was relayed to RN upstairs as we do not have supplies for that type of dressing change in the dialysis unit.     12/29/18 1730  Hand-Off documentation  Report given to (Full Name) Cloyde Reams RN 2C  Report received from (Full Name) Trellis Paganini RN  Vital Signs  Temp 99 F (37.2 C)  Temp Source Oral  Pulse Rate 77  Pulse Rate Source Monitor  Resp 19  BP (!) 117/58  BP Location Right Arm  BP Method Automatic  Patient Position (if appropriate) Lying  Oxygen Therapy  SpO2 97 %  O2 Device Room Air  Pain Assessment  Pain Scale 0-10  Pain Score 6  Pain Type Acute pain  Pain Location Arm  Pain Orientation Left  Dialysis Weight  Weight 133.4 kg  Type of Weight Post-Dialysis  During Hemodialysis Assessment  Blood Flow Rate (mL/min) 200 mL/min  Arterial Pressure (mmHg) -110 mmHg  Venous Pressure (mmHg) 120 mmHg  Transmembrane Pressure (mmHg) 60 mmHg  Ultrafiltration Rate (mL/min) 860 mL/min  Dialysate Flow Rate (mL/min) 600 ml/min  Conductivity: Machine  14  HD Safety Checks Performed Yes  Dialysis Fluid Bolus Normal Saline  Bolus Amount (mL) 250 mL  Intra-Hemodialysis Comments Tx completed  Post-Hemodialysis Assessment  Rinseback Volume (mL) 250 mL  Dialyzer Clearance Lightly streaked  Duration of HD Treatment -hour(s) 3 hour(s)  Hemodialysis Intake (mL) 500 mL  UF Total -Machine (mL) 2500 mL  Net UF (mL) 2000 mL  Tolerated HD Treatment Yes  Hemodialysis Catheter Right Subclavian Double-lumen  Placement Date/Time: (c) (c)   Placed prior to admission: Yes  Orientation: Right  Access Location: Subclavian  Hemodialysis Catheter Type: Double-lumen  Site Condition No complications  Blue Lumen Status Flushed;Capped (Central line);Heparin locked  Red Lumen Status Flushed;Capped (Central line);Heparin  locked  Purple Lumen Status N/A  Catheter fill solution Heparin 1000 units/ml  Dressing Type Gauze/Drain sponge  Dressing Status Clean;Dry;Intact  Drainage Description None  Post treatment catheter status Capped and Clamped

## 2018-12-29 NOTE — Progress Notes (Signed)
[facility]  ROUNDING NOTE   Subjective:   [name] admitted to [facility] on 12/28/2018 for Hyperglycemia [R73.9] ESRD (end stage renal disease) ([place]) [N18.6] Wound infection [T14.8XXA, L08.9]   Objective:  Vital signs in last 24 hours:  Temp:  [98.6 F (37 C)-99.3 F (37.4 C)] 99.3 F (37.4 C) (06/18 1320) Pulse Rate:  [76-81] 77 (06/18 1320) Resp:  [17-20] 18 (06/18 1320) BP: (121-158)/(60-96) 121/63 (06/18 1320) SpO2:  [91 %-99 %] 98 % (06/18 1320) Weight:  [135.4 kg] 135.4 kg (06/18 0028)  Weight change:  Filed Weights   12/28/18 1334 12/29/18 0028  Weight: 131.1 kg 135.4 kg    Intake/Output: I/O last 3 completed shifts: In: 0  Out: 200 [Urine:200]   Intake/Output this shift:  Total I/O In: 240 [P.O.:240] Out: -   Physical Exam: General: NAD,   Head: Normocephalic, atraumatic. Moist oral mucosal membranes  Eyes: Anicteric, PERRL  Neck: Supple, trachea midline  Lungs:  Clear to auscultation  Heart: Regular rate and rhythm  Abdomen:  Soft, nontender,   Extremities:  no peripheral edema.  Neurologic: Nonfocal, moving all four extremities  Skin: No lesions  Access: Left AVF with open wound, RIJ permcath    Basic Metabolic Panel: Recent Labs  Lab 12/28/18 1345 12/29/18 0233  NA 131* 129*  K 4.4 4.3  CL 91* 91*  CO2 26 23  GLUCOSE 565* 516*  BUN 44* 51*  CREATININE 5.74* 6.37*  CALCIUM 7.9* 7.8*    Liver Function Tests: Recent Labs  Lab 12/28/18 1345  AST 24  ALT 23  ALKPHOS 287*  BILITOT 0.4  PROT 7.4  ALBUMIN 3.2*   No results for input(s): LIPASE, AMYLASE in the last 168 hours. No results for input(s): AMMONIA in the last 168 hours.  CBC: Recent Labs  Lab 12/28/18 1345 12/29/18 0233  WBC 8.4 7.7  NEUTROABS 6.1  --   HGB 9.9* 9.3*  HCT 31.4* 29.8*  MCV 94.0 94.0  PLT 189 201    Cardiac Enzymes: No results for input(s): CKTOTAL, CKMB, CKMBINDEX, TROPONINI in the last 168 hours.  BNP: Invalid input(s):  POCBNP  CBG: Recent Labs  Lab 12/28/18 2318 12/29/18 0747 12/29/18 1141  GLUCAP 534* 378* 352*    Microbiology: Results for orders placed or performed during the hospital encounter of 12/28/18  SARS Coronavirus 2 (CEPHEID - Performed in [place] hospital lab), Hosp Order     Status: None   Collection Time: 12/28/18  5:22 PM   Specimen: Nasopharyngeal Swab  Result Value Ref Range Status   SARS Coronavirus 2 NEGATIVE NEGATIVE Final    Comment: (NOTE) If result is NEGATIVE SARS-CoV-2 target nucleic acids are NOT DETECTED. The SARS-CoV-2 RNA is generally detectable in upper and lower  respiratory specimens during the acute phase of infection. The lowest  concentration of SARS-CoV-2 viral copies this assay can detect is 250  copies / mL. A negative result does not preclude SARS-CoV-2 infection  and should not be used as the sole basis for treatment or other  patient management decisions.  A negative result may occur with  improper specimen collection / handling, submission of specimen other  than nasopharyngeal swab, presence of viral mutation(s) within the  areas targeted by this assay, and inadequate number of viral copies  (<250 copies / mL). A negative result must be combined with clinical  observations, patient history, and epidemiological information. If result is POSITIVE SARS-CoV-2 target nucleic acids are DETECTED. The SARS-CoV-2 RNA is  generally detectable in upper and lower  respiratory specimens dur ing the acute phase of infection.  Positive  results are indicative of active infection with SARS-CoV-2.  Clinical  correlation with patient history and other diagnostic information is  necessary to determine patient infection status.  Positive results do  not rule out bacterial infection or co-infection with other viruses. If result is PRESUMPTIVE POSTIVE SARS-CoV-2 nucleic acids MAY BE PRESENT.   A presumptive positive result was obtained on the submitted specimen   and confirmed on repeat testing.  While 2019 novel coronavirus  (SARS-CoV-2) nucleic acids may be present in the submitted sample  additional confirmatory testing may be necessary for epidemiological  and / or clinical management purposes  to differentiate between  SARS-CoV-2 and other Sarbecovirus currently known to infect humans.  If clinically indicated additional testing with an alternate test  methodology [phone]) is advised. The SARS-CoV-2 RNA is generally  detectable in upper and lower respiratory sp ecimens during the acute  phase of infection. The expected result is Negative. Fact Sheet for Patients:  StrictlyIdeas.no Fact Sheet for Healthcare Providers: BankingDealers.co.za This test is not yet approved or cleared by the [place] FDA and has been authorized for detection and/or diagnosis of SARS-CoV-2 by FDA under an Emergency Use Authorization (EUA).  This EUA will remain in effect (meaning this test can be used) for the duration of the COVID-19 declaration under Section 564(b)(1) of the Act, 21 U.S.C. section 360bbb-3(b)(1), unless the authorization is terminated or revoked sooner. Performed at [facility], [address] [place], [place] [address]   Blood culture (routine x 2)     Status: None (Preliminary result)   Collection Time: 12/28/18  5:40 PM   Specimen: BLOOD  Result Value Ref Range Status   Specimen Description BLOOD BLOOD RIGHT FOREARM  Final   Special Requests   Final    BOTTLES DRAWN AEROBIC AND ANAEROBIC Blood Culture results may not be optimal due to an excessive volume of blood received in culture bottles   Culture   Final    NO GROWTH < 24 HOURS Performed at [facility], [address], [place], [place] [address]    Report Status PENDING  Incomplete  MRSA PCR Screening     Status: None   Collection Time: 12/28/18 10:27 PM   Specimen: Nasal Mucosa; Nasopharyngeal  Result  Value Ref Range Status   MRSA by PCR NEGATIVE NEGATIVE Final    Comment:        The GeneXpert MRSA Assay (FDA approved for NASAL specimens only), is one component of a comprehensive MRSA colonization surveillance program. It is not intended to diagnose MRSA infection nor to guide or monitor treatment for MRSA infections. Performed at [facility], [address] [place], [place] [address]   Blood culture (routine x 2)     Status: None (Preliminary result)   Collection Time: 12/28/18 11:15 PM   Specimen: BLOOD  Result Value Ref Range Status   Specimen Description BLOOD RIGHT FA  Final   Special Requests   Final    BOTTLES DRAWN AEROBIC AND ANAEROBIC Blood Culture adequate volume   Culture   Final    NO GROWTH < 12 HOURS Performed at [facility], [address] [place], [place] [address]    Report Status PENDING  Incomplete    Coagulation Studies: No results for input(s): LABPROT, INR in the last 72 hours.  Urinalysis: Recent Labs    12/29/18 [place]  YELLOW*  LABSPEC 1.020  PHURINE 5.0  GLUCOSEU 50*  HGBUR NEGATIVE  BILIRUBINUR NEGATIVE  KETONESUR NEGATIVE  PROTEINUR NEGATIVE  NITRITE NEGATIVE  LEUKOCYTESUR NEGATIVE      Imaging: Dg Chest Port 1 View  Result Date: 12/29/2018 CLINICAL DATA:  CHF EXAM: PORTABLE CHEST 1 VIEW COMPARISON:  05/09/2018 FINDINGS: Cardiomegaly and pulmonary vascular congestion noted. A RIGHT IJ central venous catheter is present with tip overlying the SUPERIOR cavoatrial junction. Mild RIGHT basilar atelectasis is identified. No definite airspace disease, pleural effusion or pneumothorax. No acute bony abnormalities are present. IMPRESSION: Cardiomegaly with pulmonary vascular congestion and mild RIGHT basilar atelectasis. Electronically Signed   By: Margarette Canada M.D.   On: 12/29/2018 08:27     Medications:   . sodium chloride 250 mL (12/29/18 0535)  . ampicillin-sulbactam (UNASYN) IV     . allopurinol   100 mg Oral BH-q7a  . aspirin EC  81 mg Oral Daily  . atorvastatin  80 mg Oral Daily  . carvedilol  25 mg Oral BID WC  . Chlorhexidine Gluconate Cloth  6 each Topical Q0600  . ferric citrate  420 mg Oral BID WC  . heparin  5,000 Units Subcutaneous Q8H  . hydrALAZINE  100 mg Oral TID  . insulin aspart  0-15 Units Subcutaneous TID WC  . insulin aspart  0-5 Units Subcutaneous QHS  . insulin aspart  20 Units Subcutaneous TID WC  . insulin detemir  30 Units Subcutaneous BID  . loratadine  10 mg Oral QHS  . mometasone-formoterol  2 puff Inhalation BID  . montelukast  10 mg Oral QHS  . pantoprazole  40 mg Oral Daily  . pregabalin  75 mg Oral BID  . senna-docusate  1 tablet Oral QHS  . sodium chloride flush  3 mL Intravenous Once  . sodium chloride flush  3 mL Intravenous Q12H  . torsemide  40 mg Oral BID  . valACYclovir  500 mg Oral QODAY  . [START ON 01/10/2019] Vitamin D (Ergocalciferol)  50,000 Units Oral Q30 days   sodium chloride, acetaminophen **OR** acetaminophen, docusate sodium, HYDROcodone-acetaminophen, hydrOXYzine, morphine injection, ondansetron **OR** ondansetron (ZOFRAN) IV, polyethylene glycol, sodium chloride flush  Assessment/ Plan:  [name] is a 63 y.o. black female with end stage renal disease on hemodialysis, diabetes mellitus type II insulin dependent, hypertension, coronary artery disease, chronic hepatitis C, peptic ulcer, obstructive sleep apnea who presents to [facility] on 12/28/2018 for Hyperglycemia [R73.9] ESRD (end stage renal disease) ([place]) [N18.6] Wound infection [T14.8XXA, L08.9]  TTS [facility] ([place]) RIJ permcath  1. End Stage Renal Disease: hemodialysis treatment later today. Orders prepared.  Complication of dialysis access: infection open wound of AVF - Empiric antibiotics: unasyn.  - Vascular surgery for tomorrow.   2. Hypertension: blood pressure at goal. Home regimen of hydralazine, torsemide and  Carvedilol  3.  Anemia of  chronic kidney disease: hemoglobin 9.3 - EPO with HD treatment  4. Secondary Hyperparathyroidism: Not currently on any binders.    LOS: 1 Caidin Heidenreich 6/18/20202:51 PM

## 2018-12-29 NOTE — Progress Notes (Signed)
Pre HD Assessment    12/29/18 1420  Neurological  Level of Consciousness Alert  Orientation Level Oriented X4  Respiratory  Respiratory Pattern Regular  Chest Assessment Chest expansion symmetrical  Bilateral Breath Sounds Diminished  Cardiac  Pulse Regular  Heart Sounds S1, S2  ECG Monitor Yes  Cardiac Rhythm NSR  Vascular  R Radial Pulse +2  L Radial Pulse +2  Integumentary  Integumentary (WDL) X  Skin Color Appropriate for ethnicity  Skin Condition Dry  Skin Integrity Other (Comment);Excoriated (scratch marks)  Additional Integumentary Comments  (HD pt w infected left AVF)  Musculoskeletal  Musculoskeletal (WDL) X  Generalized Weakness Yes  Gastrointestinal  Bowel Sounds Assessment Active  GU Assessment  Genitourinary (WDL) X  Genitourinary Symptoms Oliguria

## 2018-12-29 NOTE — Consult Note (Signed)
[place] Nurse wound consult note Reason for Consult: surgical wound Followed by vascular, order updated for moist to moist wound care until surgical debridement tomorrow per vascular.   Will not consult for that reason.    Re consult if needed, will not follow at this time. Thanks  Paulo Keimig [employer], RN,CWOCN, CNS, [place] [phone])

## 2018-12-29 NOTE — Progress Notes (Signed)
Pharmacy - Brief Note (antibiotic dose adjustment)  Patient on ampicillin/sulbactam for nonhealing wound at site of previous AVG.  Cultures from 6/10 grew: prevotella, H. Parainfluenza, and E. Faecalis (amp susc)  She is on HD TTS  Plan:  Adjust Ampicillin/sulbactam to 3gm VI q24h with dose given after HD on HD days  Doreene Eland, PharmD, BCPS.   Work Cell: [phone] 12/29/2018 9:57 AM

## 2018-12-30 ENCOUNTER — Inpatient Hospital Stay: Payer: Medicare Other | Admitting: Anesthesiology

## 2018-12-30 ENCOUNTER — Encounter
Admission: EM | Disposition: A | Discharge status: Home Health | Payer: Self-pay | Source: Home / Self Care | Attending: Internal Medicine

## 2018-12-30 ENCOUNTER — Encounter: Payer: Self-pay | Admitting: Anesthesiology

## 2018-12-30 DIAGNOSIS — T8131XA Disruption of external operation (surgical) wound, not elsewhere classified, initial encounter: Secondary | ICD-10-CM

## 2018-12-30 HISTORY — PX: WOUND DEBRIDEMENT: SHX247

## 2018-12-30 LAB — GLUCOSE, CAPILLARY
Glucose-Capillary: 367 mg/dL — ABNORMAL HIGH (ref 70–99)
Glucose-Capillary: 334 mg/dL — ABNORMAL HIGH (ref 70–99)
Glucose-Capillary: 275 mg/dL — ABNORMAL HIGH (ref 70–99)
Glucose-Capillary: 235 mg/dL — ABNORMAL HIGH (ref 70–99)
Glucose-Capillary: 234 mg/dL — ABNORMAL HIGH (ref 70–99)
Glucose-Capillary: 312 mg/dL — ABNORMAL HIGH (ref 70–99)

## 2018-12-30 LAB — URINE DRUG SCREEN, QUALITATIVE (ARMC ONLY)
Amphetamines, Ur Screen: NOT DETECTED
Barbiturates, Ur Screen: NOT DETECTED
Benzodiazepine, Ur Scrn: NOT DETECTED
Cannabinoid 50 Ng, Ur ~~LOC~~: NOT DETECTED
Cocaine Metabolite,Ur ~~LOC~~: NOT DETECTED
MDMA (Ecstasy)Ur Screen: NOT DETECTED
Methadone Scn, Ur: NOT DETECTED
Opiate, Ur Screen: POSITIVE — AB
Phencyclidine (PCP) Ur S: NOT DETECTED
Tricyclic, Ur Screen: NOT DETECTED

## 2018-12-30 LAB — CBC
HCT: 29.6 % — ABNORMAL LOW (ref 36.0–46.0)
HCT: 32 % — ABNORMAL LOW (ref 36.0–46.0)
Hemoglobin: 9.4 g/dL — ABNORMAL LOW (ref 12.0–15.0)
Hemoglobin: 10.1 g/dL — ABNORMAL LOW (ref 12.0–15.0)
MCH: 29.8 pg (ref 26.0–34.0)
MCH: 30 pg (ref 26.0–34.0)
MCHC: 31.8 g/dL (ref 30.0–36.0)
MCHC: 31.6 g/dL (ref 30.0–36.0)
MCV: 94 fL (ref 80.0–100.0)
MCV: 95 fL (ref 80.0–100.0)
Platelets: 192 10*3/uL (ref 150–400)
Platelets: 196 10*3/uL (ref 150–400)
RBC: 3.15 MIL/uL — ABNORMAL LOW (ref 3.87–5.11)
RBC: 3.37 MIL/uL — ABNORMAL LOW (ref 3.87–5.11)
RDW: 16.1 % — ABNORMAL HIGH (ref 11.5–15.5)
RDW: 16 % — ABNORMAL HIGH (ref 11.5–15.5)
WBC: 8.1 10*3/uL (ref 4.0–10.5)
WBC: 8.5 10*3/uL (ref 4.0–10.5)
nRBC: 0.2 % (ref 0.0–0.2)
nRBC: 0.2 % (ref 0.0–0.2)

## 2018-12-30 LAB — BASIC METABOLIC PANEL
Anion gap: 12 (ref 5–15)
BUN: 39 mg/dL — ABNORMAL HIGH (ref 6–20)
CO2: 26 mmol/L (ref 22–32)
Calcium: 7.5 mg/dL — ABNORMAL LOW (ref 8.9–10.3)
Chloride: 91 mmol/L — ABNORMAL LOW (ref 98–111)
Creatinine, Ser: 5.01 mg/dL — ABNORMAL HIGH (ref 0.44–1.00)
GFR calc Af Amer: 10 mL/min — ABNORMAL LOW (ref >60)
GFR calc non Af Amer: 9 mL/min — ABNORMAL LOW (ref >60)
Glucose, Bld: 394 mg/dL — ABNORMAL HIGH (ref 70–99)
Potassium: 3.7 mmol/L (ref 3.5–5.1)
Sodium: 129 mmol/L — ABNORMAL LOW (ref 135–145)

## 2018-12-30 LAB — TYPE AND SCREEN
ABO/RH(D): A POS
Antibody Screen: NEGATIVE

## 2018-12-30 LAB — PROTIME-INR
INR: 1 (ref 0.8–1.2)
Prothrombin Time: 13.1 seconds (ref 11.4–15.2)

## 2018-12-30 LAB — RENAL FUNCTION PANEL
Albumin: 3 g/dL — ABNORMAL LOW (ref 3.5–5.0)
Anion gap: 15 (ref 5–15)
BUN: 49 mg/dL — ABNORMAL HIGH (ref 6–20)
CO2: 26 mmol/L (ref 22–32)
Calcium: 7.9 mg/dL — ABNORMAL LOW (ref 8.9–10.3)
Chloride: 91 mmol/L — ABNORMAL LOW (ref 98–111)
Creatinine, Ser: 5.23 mg/dL — ABNORMAL HIGH (ref 0.44–1.00)
GFR calc Af Amer: 10 mL/min — ABNORMAL LOW (ref >60)
GFR calc non Af Amer: 8 mL/min — ABNORMAL LOW (ref >60)
Glucose, Bld: 225 mg/dL — ABNORMAL HIGH (ref 70–99)
Phosphorus: 5.5 mg/dL — ABNORMAL HIGH (ref 2.5–4.6)
Potassium: 3.8 mmol/L (ref 3.5–5.1)
Sodium: 132 mmol/L — ABNORMAL LOW (ref 135–145)

## 2018-12-30 LAB — MAGNESIUM: Magnesium: 1.8 mg/dL (ref 1.7–2.4)

## 2018-12-30 LAB — APTT: aPTT: 30 seconds (ref 24–36)

## 2018-12-30 LAB — HIV ANTIBODY (ROUTINE TESTING W REFLEX): HIV Screen 4th Generation wRfx: NONREACTIVE

## 2018-12-30 SURGERY — DEBRIDEMENT, WOUND
Anesthesia: General | Laterality: Left

## 2018-12-30 MED ORDER — INSULIN ASPART 100 UNIT/ML ~~LOC~~ SOLN
SUBCUTANEOUS | Status: AC
  Administered 2018-12-30: 5 [IU] via SUBCUTANEOUS
  Filled 2018-12-30: qty 1

## 2018-12-30 MED ORDER — SUCCINYLCHOLINE CHLORIDE 20 MG/ML IJ SOLN
INTRAMUSCULAR | Status: DC | PRN
  Administered 2018-12-30: 100 mg via INTRAVENOUS

## 2018-12-30 MED ORDER — PROPOFOL 10 MG/ML IV BOLUS
INTRAVENOUS | Status: DC | PRN
  Administered 2018-12-30: 100 mg via INTRAVENOUS

## 2018-12-30 MED ORDER — LIDOCAINE HCL (PF) 2 % IJ SOLN
INTRAMUSCULAR | Status: AC
  Filled 2018-12-30: qty 10

## 2018-12-30 MED ORDER — ROCURONIUM BROMIDE 100 MG/10ML IV SOLN
INTRAVENOUS | Status: DC | PRN
  Administered 2018-12-30: 50 mg via INTRAVENOUS

## 2018-12-30 MED ORDER — BUPIVACAINE LIPOSOME 1.3 % IJ SUSP
INTRAMUSCULAR | Status: DC | PRN
  Administered 2018-12-30: 50 mL

## 2018-12-30 MED ORDER — BUPIVACAINE HCL (PF) 0.5 % IJ SOLN
INTRAMUSCULAR | Status: AC
  Filled 2018-12-30: qty 30

## 2018-12-30 MED ORDER — ONDANSETRON HCL 4 MG/2ML IJ SOLN
INTRAMUSCULAR | Status: AC
  Filled 2018-12-30: qty 2

## 2018-12-30 MED ORDER — ALUM & MAG HYDROXIDE-SIMETH 200-200-20 MG/5ML PO SUSP
30.0000 mL | ORAL | Status: DC | PRN
  Administered 2018-12-30 – 2019-01-01 (×2): 30 mL via ORAL
  Filled 2018-12-30 (×2): qty 30

## 2018-12-30 MED ORDER — ONDANSETRON HCL 4 MG/2ML IJ SOLN
INTRAMUSCULAR | Status: DC | PRN
  Administered 2018-12-30: 4 mg via INTRAVENOUS

## 2018-12-30 MED ORDER — INSULIN ASPART 100 UNIT/ML ~~LOC~~ SOLN
5.0000 [IU] | Freq: Once | SUBCUTANEOUS | Status: AC
  Administered 2018-12-30: 5 [IU] via SUBCUTANEOUS

## 2018-12-30 MED ORDER — LIDOCAINE HCL (CARDIAC) PF 100 MG/5ML IV SOSY
PREFILLED_SYRINGE | INTRAVENOUS | Status: DC | PRN
  Administered 2018-12-30: 100 mg via INTRAVENOUS

## 2018-12-30 MED ORDER — CEFAZOLIN SODIUM-DEXTROSE 1-4 GM/50ML-% IV SOLN
INTRAVENOUS | Status: DC | PRN
  Administered 2018-12-30: 1 g via INTRAVENOUS

## 2018-12-30 MED ORDER — BUPIVACAINE LIPOSOME 1.3 % IJ SUSP
INTRAMUSCULAR | Status: AC
  Filled 2018-12-30: qty 20

## 2018-12-30 MED ORDER — MIDAZOLAM HCL 2 MG/2ML IJ SOLN
INTRAMUSCULAR | Status: AC
  Filled 2018-12-30: qty 2

## 2018-12-30 MED ORDER — HYDROMORPHONE HCL 1 MG/ML IJ SOLN
INTRAMUSCULAR | Status: AC
  Administered 2018-12-30: 1 mg via INTRAVENOUS
  Filled 2018-12-30: qty 1

## 2018-12-30 MED ORDER — SODIUM CHLORIDE 0.9 % IV SOLN: Freq: Once | INTRAVENOUS | Status: DC

## 2018-12-30 MED ORDER — CEFAZOLIN SODIUM 1 G IJ SOLR
INTRAMUSCULAR | Status: AC
  Filled 2018-12-30: qty 10

## 2018-12-30 MED ORDER — PHENYLEPHRINE HCL (PRESSORS) 10 MG/ML IV SOLN
INTRAVENOUS | Status: DC | PRN
  Administered 2018-12-30: 200 ug via INTRAVENOUS
  Administered 2018-12-30 (×5): 100 ug via INTRAVENOUS

## 2018-12-30 MED ORDER — GLYCOPYRROLATE 0.2 MG/ML IJ SOLN
INTRAMUSCULAR | Status: DC | PRN
  Administered 2018-12-30: 0.4 mg via INTRAVENOUS

## 2018-12-30 MED ORDER — HYDROMORPHONE HCL 1 MG/ML IJ SOLN
1.0000 mg | Freq: Once | INTRAMUSCULAR | Status: AC
  Administered 2018-12-30: 11:00:00 1 mg via INTRAVENOUS

## 2018-12-30 MED ORDER — INSULIN ASPART 100 UNIT/ML ~~LOC~~ SOLN
25.0000 [IU] | Freq: Three times a day (TID) | SUBCUTANEOUS | Status: DC
  Administered 2018-12-30 – 2019-01-02 (×10): 25 [IU] via SUBCUTANEOUS
  Filled 2018-12-30 (×11): qty 1

## 2018-12-30 MED ORDER — FENTANYL CITRATE (PF) 100 MCG/2ML IJ SOLN
INTRAMUSCULAR | Status: DC | PRN
  Administered 2018-12-30: 50 ug via INTRAVENOUS

## 2018-12-30 MED ORDER — FENTANYL CITRATE (PF) 100 MCG/2ML IJ SOLN
INTRAMUSCULAR | Status: AC
  Filled 2018-12-30: qty 2

## 2018-12-30 MED ORDER — DEXAMETHASONE SODIUM PHOSPHATE 10 MG/ML IJ SOLN
INTRAMUSCULAR | Status: AC
  Filled 2018-12-30: qty 1

## 2018-12-30 MED ORDER — INSULIN ASPART 100 UNIT/ML ~~LOC~~ SOLN
SUBCUTANEOUS | Status: AC
  Filled 2018-12-30: qty 1

## 2018-12-30 MED ORDER — MIDAZOLAM HCL 2 MG/2ML IJ SOLN
INTRAMUSCULAR | Status: DC | PRN
  Administered 2018-12-30: 2 mg via INTRAVENOUS

## 2018-12-30 MED ORDER — INSULIN DETEMIR 100 UNIT/ML ~~LOC~~ SOLN
40.0000 [IU] | Freq: Two times a day (BID) | SUBCUTANEOUS | Status: DC
  Administered 2018-12-30 – 2019-01-02 (×5): 40 [IU] via SUBCUTANEOUS
  Filled 2018-12-30 (×8): qty 0.4

## 2018-12-30 MED ORDER — PROPOFOL 10 MG/ML IV BOLUS
INTRAVENOUS | Status: AC
  Filled 2018-12-30: qty 20

## 2018-12-30 MED ORDER — NEOSTIGMINE METHYLSULFATE 10 MG/10ML IV SOLN
INTRAVENOUS | Status: DC | PRN
  Administered 2018-12-30: 3 mg via INTRAVENOUS

## 2018-12-30 SURGICAL SUPPLY — 26 items
APL PRP STRL LF DISP 70% ISPRP (MISCELLANEOUS) ×1
BNDG COHESIVE 6X5 TAN STRL LF (GAUZE/BANDAGES/DRESSINGS) ×2 IMPLANT
BNDG CONFORM 2 STRL LF (GAUZE/BANDAGES/DRESSINGS) IMPLANT
CANISTER SUCT 1200ML W/VALVE (MISCELLANEOUS) ×2 IMPLANT
CANISTER WOUND CARE 500ML ATS (WOUND CARE) ×2 IMPLANT
CHLORAPREP W/TINT 26 (MISCELLANEOUS) ×2 IMPLANT
COVER WAND RF STERILE (DRAPES) ×2 IMPLANT
DRSG EMULSION OIL 3X3 NADH (GAUZE/BANDAGES/DRESSINGS) ×1 IMPLANT
DRSG VAC ATS LRG SENSATRAC (GAUZE/BANDAGES/DRESSINGS) ×2 IMPLANT
DRSG VAC ATS MED SENSATRAC (GAUZE/BANDAGES/DRESSINGS) ×1 IMPLANT
ELECT REM PT RETURN 9FT ADLT (ELECTROSURGICAL) ×2
ELECTRODE REM PT RTRN 9FT ADLT (ELECTROSURGICAL) ×1 IMPLANT
GAUZE SPONGE 4X4 12PLY STRL (GAUZE/BANDAGES/DRESSINGS) ×1 IMPLANT
GLOVE SURG SYN 8.0 (GLOVE) ×2 IMPLANT
GOWN STRL REUS W/ TWL LRG LVL3 (GOWN DISPOSABLE) ×1 IMPLANT
GOWN STRL REUS W/ TWL XL LVL3 (GOWN DISPOSABLE) ×1 IMPLANT
GOWN STRL REUS W/TWL LRG LVL3 (GOWN DISPOSABLE) ×2
GOWN STRL REUS W/TWL XL LVL3 (GOWN DISPOSABLE) ×2
KIT TURNOVER KIT A (KITS) ×2 IMPLANT
LABEL OR SOLS (LABEL) ×2 IMPLANT
NS IRRIG 500ML POUR BTL (IV SOLUTION) ×2 IMPLANT
PACK EXTREMITY ARMC (MISCELLANEOUS) ×2 IMPLANT
PAD PREP 24X41 OB/GYN DISP (PERSONAL CARE ITEMS) ×2 IMPLANT
SOL PREP PVP 2OZ (MISCELLANEOUS) ×4
SOLUTION PREP PVP 2OZ (MISCELLANEOUS) ×1 IMPLANT
STOCKINETTE IMPERV 14X48 (MISCELLANEOUS) ×2 IMPLANT

## 2018-12-30 NOTE — Anesthesia Post-op Follow-up Note (Signed)
Anesthesia QCDR form completed.        

## 2018-12-30 NOTE — TOC Initial Note (Signed)
Transition of Care [facility]) - Initial/Assessment Note    Patient Details  Name: [name] MRN: [id number] Date of Birth: Nov 16, 1955  Transition of Care [facility]) CM/SW Contact:    Beverly Sessions, RN Phone Number: 12/30/2018, 4:27 PM  Clinical Narrative:                 Patient admitted with Nonhealing left upper extremity wound.  Patient lives at home with daughter. PCP [facility].  Denies any issues obtaining medication  States that her daughter provides transportation to all appointments and HD.  Elvera Bicker dialysis liaison notified of admission.    Patient was previously open with [facility] home health.  [facility] has since given the referral to [facility].  Patient and Corene Cornea with [facility] confirm.  Anticipate patient will discharge with wound vac.  Patient is currently on hospital vac, and her [place] is in the room   Expected Discharge Plan: [place] Services Barriers to Discharge: Continued Medical Work up   Patient Goals and CMS Choice   CMS Medicare.gov Compare Post Acute Care list provided to:: Patient Choice offered to / list presented to : Patient  Expected Discharge Plan and Services Expected Discharge Plan: [place]   Discharge Planning Services: CM Consult   Living arrangements for the past 2 months: Single Family Home                           HH Arranged: RN [facility] Agency: [place] ([place]) Date HH Agency Contacted: 12/30/18 Time HH Agency Contacted: 1100 Representative spoke with at [place]: Corene Cornea  Prior Living Arrangements/Services Living arrangements for the past 2 months: [place] with:: Adult Children Patient language and need for interpreter reviewed:: Yes Do you feel safe going back to the place where you live?: Yes      Need for Family Participation in Patient Care: Yes (Comment) Care giver support system in  place?: Yes (comment) Current home services: DME Criminal Activity/Legal Involvement Pertinent to Current Situation/Hospitalization: No - Comment as needed  Activities of Daily Living Home Assistive Devices/Equipment: None ADL Screening (condition at time of admission) Patient's cognitive ability adequate to safely complete daily activities?: Yes Is the patient deaf or have difficulty hearing?: No Does the patient have difficulty seeing, even when wearing glasses/contacts?: No Does the patient have difficulty concentrating, remembering, or making decisions?: No Patient able to express need for assistance with ADLs?: Yes Does the patient have difficulty dressing or bathing?: No Independently performs ADLs?: Yes (appropriate for developmental age) Does the patient have difficulty walking or climbing stairs?: Yes Weakness of Legs: None Weakness of Arms/Hands: Left  Permission Sought/Granted                  Emotional Assessment Appearance:: Appears stated age Attitude/Demeanor/Rapport: Gracious Affect (typically observed): Accepting Orientation: : Oriented to Self, Oriented to Place, Oriented to  Time, Oriented to Situation   Psych Involvement: No (comment)  Admission diagnosis:  Hyperglycemia [R73.9] ESRD (end stage renal disease) ([place]) [N18.6] Wound infection [T14.8XXA, L08.9] Patient Active Problem List   Diagnosis Date Noted  . Non-healing wound of upper extremity 12/28/2018  . Complication from renal dialysis device 10/20/2018  . ESRD on dialysis ([place]) 06/01/2018  . ARF (acute renal failure) ([place]) 05/09/2018  . Colon cancer screening   . LGSIL on Pap smear of cervix 01/27/2018  .  Gout 12/25/2017  . AKI (acute kidney injury) ([place]) 12/24/2017  . Hyperglycemia 12/24/2017  . Chronic diastolic heart failure ([place]) 09/30/2017  . Lymphedema 09/30/2017  . Aortic stenosis   . Chronic pain of right knee 02/12/2017  . Chronic gout due to renal impairment of multiple sites  without tophus 02/12/2017  . Esophageal dysphagia 02/12/2017  . Osteoarthritis 01/22/2017  . Diabetic hyperosmolar non-ketotic state ([place]) 12/13/2016  . CAD (coronary artery disease) 12/13/2016  . Hyperlipidemia 12/13/2016  . Hyperphosphatemia 12/13/2016  . Asthma 11/29/2015  . Depression 09/05/2015  . GERD (gastroesophageal reflux disease) 03/25/2015  . Environmental allergies 03/14/2015  . Chronic hepatitis C without hepatic coma ([place]) 01/31/2015  . Diabetic neuropathy ([place]) 01/31/2015  . OSA (obstructive sleep apnea) 01/01/2015  . Poor dentition 11/13/2014  . Essential hypertension 10/08/2014  . Obesity 10/08/2014  . Diabetes mellitus type 2, uncontrolled, with complications ([place]) 99/35/7017    Class: Chronic   PCP:  [name], PA-C Pharmacy:   [place], [place] - [address] [place] [address] Phone: [phone] Fax: [phone]  [id number] - [place], [place] - [address] [place] [address] Phone: [phone] Fax: [place] [place] [address] Phone: [phone] Fax: [phone]  [id number] - [place], [place] [address] [place] [address] Phone: [phone] Fax: [phone]     Social Determinants of Health (SDOH) Interventions    Readmission Risk Interventions No flowsheet data found.

## 2018-12-30 NOTE — Op Note (Signed)
    OPERATIVE NOTE   PROCEDURE: Excisional debridement left upper arm wound and placement of a VAC wound dressing  PRE-OPERATIVE DIAGNOSIS: Dehiscence of incision status post basilic transposition left arm  POST-OPERATIVE DIAGNOSIS: Same  SURGEON: Hortencia Pilar  ASSISTANT(S): Ms. Hezzie Bump  ANESTHESIA: general  ESTIMATED BLOOD LOSS: 25 cc  FINDING(S): Desiccated fat and subcutaneous tissues  SPECIMEN(S): None  INDICATIONS:   [name] is a 63 y.o. female who presents with dehiscence of the incision status post basilic transposition.  She underwent initial debridement approximately 10 days ago and a VAC was placed.  Apparently, there were problems with the [facility] and she removed it herself but waited approximately 4 days until returning to the emergency room secondary to increasing pain and odor.  She was admitted initiated on antibiotics.  We are now debriding areas of desiccated fat and necrotic subcutaneous tissues.  Risks and benefits of been reviewed all questions answered patient agrees to proceed..  DESCRIPTION: After full informed written consent was obtained from the patient, the patient was brought back to the operating room and placed supine upon the operating table.  Prior to induction, the patient received IV antibiotics.   After obtaining adequate anesthesia, the patient was then prepped and draped in the standard fashion for a debridement of left arm wound with placement of a VAC dressing.  A first assistant is required in order to allow for a safe and more efficient operation.  Duties include retraction of tissues to allow for optimal exposure, assisting with suture ligation of vessels as well as maintaining a clear field of view with suction as needed.  Further duties include assisting with patient positioning during the surgery as well as wound closure.  I believe that this procedure requires a first assistant in order for it to be performed at a level in  keeping with the high standards of this institution.  30 cc of Exparel with Marcaine is then infiltrated into the skin edge and soft tissues of the wound.  Using a 15 blade scalpel desiccated subcutaneous fat as well as necrotic soft tissue is removed.  Hemostasis is then obtained with Bovie cautery.  The wound is then irrigated with 100 cc of normal saline.  A VAC dressing is then applied without difficulty with an excellent seal.   The patient tolerated this procedure well.   COMPLICATIONS: None  CONDITION: [facility]  Office: [phone]   12/30/2018, 2:01 PM

## 2018-12-30 NOTE — Consult Note (Signed)
Pharmacy Antibiotic Note  [name] is a 63 y.o. female admitted on 12/28/2018 with a nonhealing wound at site of previous AVG. Pharmacy has been consulted for Unasyn dosing. Cultures from 6/10 grew: prevotella, H. Parainfluenza, and E. Faecalis (amp susc). Seen by vascular surgery and status post wound dehiscence with cleanup of the wound and wound VAC placement in the operating room today. This is day # 3 of antibiotic therapy  She is on HD TTS  Plan: Continue ampicillin/sulbactam to 3gm VI q24h with dose given after HD on HD days  Height: 5\' 8"  (172.7 cm) Weight: 294 lb 1.5 oz (133.4 kg) IBW/kg (Calculated) : 63.9  Temp (24hrs), Avg:98.4 F (36.9 C), Min:97.7 F (36.5 C), Max:99 F (37.2 C)  Recent Labs  Lab 12/28/18 1345 12/28/18 1740 12/29/18 0233 12/30/18 0230  WBC 8.4  --  7.7 8.1  CREATININE 5.74*  --  6.37* 5.01*  LATICACIDVEN 1.8 1.6  --   --     Estimated Creatinine Clearance: 17.3 mL/min (A) (by C-G formula based on SCr of 5.01 mg/dL (H)).    Antimicrobials this admission: Unasyn 6/17 >>   Microbiology results: 6/17 BCx: NGTD 6/10 WCx: E faecalis  6/17 SARS CoV-2: negative  6/17 MRSA PCR: negative  Thank you for allowing pharmacy to be a part of this patient's care.  Dallie Piles, PharmD 12/30/2018 3:11 PM

## 2018-12-30 NOTE — Anesthesia Preprocedure Evaluation (Signed)
Anesthesia Evaluation  Patient identified by MRN, date of birth, ID band  History of Anesthesia Complications Negative for: history of anesthetic complications  Airway Mallampati: III  TM Distance: >3 FB     Dental  (+) Partial Upper   Pulmonary asthma , sleep apnea , pneumonia, former smoker,           Cardiovascular hypertension, + CAD, + Past MI, + Cardiac Stents and +CHF  + Valvular Problems/Murmurs AS      Neuro/Psych PSYCHIATRIC DISORDERS Depression    GI/Hepatic GERD  ,(+) Hepatitis -, C  Endo/Other  diabetes  Renal/GU Dialysis and ESRFRenal disease     Musculoskeletal  (+) Arthritis , Osteoarthritis,    Abdominal   Peds  Hematology  (+) Blood dyscrasia, anemia ,   Anesthesia Other Findings   Reproductive/Obstetrics                             Anesthesia Physical  Anesthesia Plan  ASA: IV  Anesthesia Plan: General   Post-op Pain Management:    Induction:   PONV Risk Score and Plan:   Airway Management Planned: Oral ETT  Additional Equipment:   Intra-op Plan:   Post-operative Plan: Extubation in OR  Informed Consent: I have reviewed the patients History and Physical, chart, labs and discussed the procedure including the risks, benefits and alternatives for the proposed anesthesia with the patient or authorized representative who has indicated his/her understanding and acceptance.     Dental advisory given  Plan Discussed with: CRNA and Surgeon  Anesthesia Plan Comments:         Anesthesia Quick Evaluation

## 2018-12-30 NOTE — Progress Notes (Signed)
[place] at [place] NAME: [name]    MR#:  [id number]  DATE OF BIRTH:  [birth year]  SUBJECTIVE:   Patient admitted due to left upper extremity wound with pain.  Patient was recently hospitalized and underwent debridement of the left upper extremity wound with wound VAC placement but returns back due to severe pain in the left upper extremity.  Patient is status post incision and drainage and wound VAC placement of the left upper extremity wound today.  REVIEW OF SYSTEMS:    Review of Systems  Constitutional: Negative for chills and fever.  HENT: Negative for congestion and tinnitus.   Eyes: Negative for blurred vision and double vision.  Respiratory: Negative for cough, shortness of breath and wheezing.   Cardiovascular: Negative for chest pain, orthopnea and PND.  Gastrointestinal: Negative for abdominal pain, diarrhea, nausea and vomiting.  Genitourinary: Negative for dysuria and hematuria.  Neurological: Negative for dizziness, sensory change and focal weakness.  All other systems reviewed and are negative.   Nutrition: Renal/Carb modified Tolerating Diet: yes Tolerating PT: await eval.   DRUG ALLERGIES:   Allergies  Allergen Reactions  . Shellfish Allergy Anaphylaxis and Swelling  . Diazepam Other (See Comments)    "felt like out of body experience"    VITALS:  Blood pressure 133/77, pulse 69, temperature 97.7 F (36.5 C), resp. rate 14, height 5\' 8"  (1.727 m), weight 133.4 kg, SpO2 93 %.  PHYSICAL EXAMINATION:   Physical Exam  GENERAL:  63 y.o.-year-old patient lying in bed in no acute distress.  EYES: Pupils equal, round, reactive to light and accommodation. No scleral icterus. Extraocular muscles intact.  HEENT: Head atraumatic, normocephalic. Oropharynx and nasopharynx clear.  NECK:  Supple, no jugular venous distention. No thyroid enlargement, no tenderness.  LUNGS: Normal breath sounds bilaterally, no  wheezing, rales, rhonchi. No use of accessory muscles of respiration.  CARDIOVASCULAR: S1, S2 normal. No murmurs, rubs, or gallops.  ABDOMEN: Soft, nontender, nondistended. Bowel sounds present. No organomegaly or mass.  EXTREMITIES: No cyanosis, clubbing or edema b/l.   Left upper extremity dressing secondary to the wound with previous basilic vein transposition.  NEUROLOGIC: Cranial nerves II through XII are intact. No focal Motor or sensory deficits b/l.   PSYCHIATRIC: The patient is alert and oriented x 3.  SKIN: No obvious rash, lesion, or ulcer.    LABORATORY PANEL:   CBC Recent Labs  Lab 12/30/18 0230  WBC 8.1  HGB 9.4*  HCT 29.6*  PLT 192   ------------------------------------------------------------------------------------------------------------------  Chemistries  Recent Labs  Lab 12/28/18 1345  12/30/18 0230  NA 131*   < > 129*  K 4.4   < > 3.7  CL 91*   < > 91*  CO2 26   < > 26  GLUCOSE 565*   < > 394*  BUN 44*   < > 39*  CREATININE 5.74*   < > 5.01*  CALCIUM 7.9*   < > 7.5*  MG  --   --  1.8  AST 24  --   --   ALT 23  --   --   ALKPHOS 287*  --   --   BILITOT 0.4  --   --    < > = values in this interval not displayed.   ------------------------------------------------------------------------------------------------------------------  Cardiac Enzymes No results for input(s): TROPONINI in the last 168 hours. ------------------------------------------------------------------------------------------------------------------  RADIOLOGY:  Dg Chest Port 1 View  Result Date: 12/29/2018 CLINICAL  DATA:  CHF EXAM: PORTABLE CHEST 1 VIEW COMPARISON:  05/09/2018 FINDINGS: Cardiomegaly and pulmonary vascular congestion noted. A RIGHT IJ central venous catheter is present with tip overlying the SUPERIOR cavoatrial junction. Mild RIGHT basilar atelectasis is identified. No definite airspace disease, pleural effusion or pneumothorax. No acute bony abnormalities are  present. IMPRESSION: Cardiomegaly with pulmonary vascular congestion and mild RIGHT basilar atelectasis. Electronically Signed   By: Margarette Canada M.D.   On: 12/29/2018 08:27     ASSESSMENT AND PLAN:   63 year old female with past medical history of end-stage renal disease on hemodialysis, chronic combined systolic diastolic CHF, essential hypertension, GERD, depression, coronary artery disease who presented to the hospital due to left upper extremity wound.  1.  Nonhealing left upper extremity wound-patient was recently hospitalized and had removal of her AV fistula/trasposition of basilic vein and also debridement of the wound with wound VAC placement.  Her symptoms got worse so she came back to the hospital. -Seen by vascular surgery and status post wound dehiscence with cleanup of the wound and wound VAC placement in the operating room today.  Continue further care as per vascular surgery. -Continue local wound care empiric Unasyn.  Afebrile and hemodynamically stable.  2.  End-stage renal disease on hemodialysis-patient gets dialysis on Tuesday Thursday Saturday. - Patient has a right chest wall PermCath.  Continue dialysis as per nephrology.  3.  Diabetes type 2 with hyperglycemia- blood sugars were somewhat uncontrolled -Appreciate diabetes coordinator input.  Will advance Levemir dose, increase NovoLog with meals and continue sliding scale insulin coverage for now.  4.  Essential hypertension-continue carvedilol, hydralazine.  5.  History of combined systolic diastolic CHF-clinically patient is not in congestive heart failure.  Continue fluid removal as per dialysis. -Continue carvedilol, torsemide  6.  Neuropathy-continue Lyrica.  7.  GERD-continue Protonix.  8.  Hyperlipidemia-continue atorvastatin.  9.  History of gout-no acute attack.  Continue allopurinol.  All the records are reviewed and case discussed with Care Management/Social Worker. Management plans discussed with  the patient, family and they are in agreement.  CODE STATUS: Full code  DVT Prophylaxis: Hep. sQ  TOTAL TIME TAKING CARE OF THIS PATIENT: 30 minutes.   POSSIBLE D/C IN 2-3 DAYS, DEPENDING ON CLINICAL CONDITION.   Henreitta Leber M.D on 12/30/2018 at 2:57 PM  Between 7am to 6pm - Pager - [phone]  After 6pm go to [email or web address] - [employer] [facility]  Office  [phone]  CC: Primary care physician; [name], PA-C

## 2018-12-30 NOTE — Anesthesia Postprocedure Evaluation (Signed)
Anesthesia Post Note  Patient: [name]  Procedure(s) Performed: DEBRIDEMENT WOUND WITH VAC PLACEMENT (LEFT UPPER EXTREMITY) (Left )  Patient location during evaluation: PACU Anesthesia Type: General Level of consciousness: awake and alert Pain management: pain level controlled Vital Signs Assessment: post-procedure vital signs reviewed and stable Respiratory status: spontaneous breathing, nonlabored ventilation, respiratory function stable and patient connected to nasal cannula oxygen Cardiovascular status: blood pressure returned to baseline and stable Postop Assessment: no apparent nausea or vomiting Anesthetic complications: no     Last Vitals:  Vitals:   12/30/18 1422 12/30/18 1438  BP: (!) 128/59 133/77  Pulse: 75 69  Resp: 13 14  Temp:  36.5 C  SpO2: 96% 93%    Last Pain:  Vitals:   12/30/18 1438  TempSrc:   PainSc: 0-No pain                 Precious Haws [place]iscitello

## 2018-12-30 NOTE — Anesthesia Procedure Notes (Signed)
Procedure Name: Intubation Date/Time: 12/30/2018 12:22 PM Performed by: Eben Burow, CRNA Pre-anesthesia Checklist: Patient identified, Suction available, Patient being monitored and Emergency Drugs available Patient Re-evaluated:Patient Re-evaluated prior to induction Oxygen Delivery Method: Circle system utilized Preoxygenation: Pre-oxygenation with 100% oxygen Induction Type: IV induction Laryngoscope Size: McGraph and 3 Grade View: Grade I Tube type: Oral Tube size: 7.5 mm Number of attempts: 1 Airway Equipment and Method: Stylet and Video-laryngoscopy Placement Confirmation: positive ETCO2 and breath sounds checked- equal and bilateral Secured at: 21 cm Tube secured with: Tape Dental Injury: Teeth and Oropharynx as per pre-operative assessment

## 2018-12-30 NOTE — Care Management Important Message (Signed)
Important Message  Patient Details  Name: [name] MRN: [id number] Date of Birth: [birth year]   Medicare Important Message Given:  Yes  Initial Medicare IM given by Patient Access Associate on 12/29/2018 at 1019.  Still valid.   Dannette Barbara 12/30/2018, 9:18 AM

## 2018-12-30 NOTE — Progress Notes (Addendum)
Inpatient Diabetes Program Recommendations  AACE/ADA: New Consensus Statement on Inpatient Glycemic Control (2015)  Target Ranges:  Prepandial:   less than 140 mg/dL      Peak postprandial:   less than 180 mg/dL (1-2 hours)      Critically ill patients:  140 - 180 mg/dL   Results for Heather, [name] (MRN [id number]) as of 12/30/2018 07:18  Ref. Range 12/29/2018 07:47 12/29/2018 11:41 12/29/2018 21:46  Glucose-Capillary Latest Ref Range: 70 - 99 mg/dL 378 (H)  35 units NOVOLOG  352 (H)  35 units NOVOLOG +  30 units LEVEMIR  421 (H)  20 units NOVOLOG +  30 units LEVEMIR    Results for Heather, [name] (MRN [id number]) as of 12/30/2018 09:51  Ref. Range 12/30/2018 07:36  Glucose-Capillary Latest Ref Range: 70 - 99 mg/dL 367 (H)  15 units NOVOLOG +  30 units LEVEMIR   Results for Heather, [name] (MRN [id number]) as of 12/30/2018 07:18  Ref. Range 01/27/2018 13:53 04/29/2018 11:31 06/01/2018 11:45 12/28/2018 23:16  Hemoglobin A1C Latest Ref Range: 4.8 - 5.6 % 10.2 (H) 11.0 (A) 9.3 (A) 9.6 (H)  (228 mg/dl)    History: DM, CHF, ESRD   Home DM Meds: Victoza 1.8 mg Daily                             Novolog 30 units TID with meals                             Levemir 60 units BID   Current Orders: Novolog Moderate Correction Scale/ SSI (0-15 units) TID AC + HS                           Novolog 20 units TID with meals     Levemir 30 units BID      Note Levemir 30 units BID started yesterday at 12pm.  Current A1c relatively unchanged since July of 2019.  Needs better control at home to help promote wound healing.  Note pt NPO this AM for Surgery.     MD- Please consider the following in-hospital insulin adjustments:  1. Increase Levemir to 40 units BID (70% total home dose)   2. Increase Novolog Meal Coverage to: Novolog 25 units TID with meals  (Please add the following Hold Parameters: Hold if pt eats <50% of meal, Hold if pt NPO)      --Will follow patient  during hospitalization--  Wyn Quaker RN, MSN, CDE Diabetes Coordinator Inpatient Glycemic Control Team Team Pager: [phone] (8a-5p)

## 2018-12-30 NOTE — Transfer of Care (Signed)
Immediate Anesthesia Transfer of Care Note  Patient: [name]  Procedure(s) Performed: DEBRIDEMENT WOUND WITH VAC PLACEMENT (LEFT UPPER EXTREMITY) (Left )  Patient Location: PACU  Anesthesia Type:General  Level of Consciousness: sedated  Airway & Oxygen Therapy: Patient Spontanous Breathing and Patient connected to face mask oxygen  Post-op Assessment: Report given to RN and Post -op Vital signs reviewed and stable  Post vital signs: Reviewed and stable  Last Vitals:  Vitals Value Taken Time  BP 125/61 12/30/18 1353  Temp 36.8 C 12/30/18 1353  Pulse 76 12/30/18 1355  Resp 12 12/30/18 1355  SpO2 98 % 12/30/18 1355  Vitals shown include unvalidated device data.  Last Pain:  Vitals:   12/30/18 0959  TempSrc: Temporal  PainSc: 10-Worst pain ever      Patients Stated Pain Goal: 0 (46/43/14 2767)  Complications: No apparent anesthesia complications

## 2018-12-31 ENCOUNTER — Encounter: Payer: Self-pay | Admitting: Vascular Surgery

## 2018-12-31 LAB — GLUCOSE, CAPILLARY
Glucose-Capillary: 319 mg/dL — ABNORMAL HIGH (ref 70–99)
Glucose-Capillary: 235 mg/dL — ABNORMAL HIGH (ref 70–99)
Glucose-Capillary: 298 mg/dL — ABNORMAL HIGH (ref 70–99)

## 2018-12-31 NOTE — Progress Notes (Signed)
HD START   12/31/18 0830  Vital Signs  Temp 98.8 F (37.1 C)  Temp Source Oral  Pulse Rate 70  Pulse Rate Source Monitor  Resp (!) 21  BP 137/64  BP Location Right Arm  BP Method Automatic  Patient Position (if appropriate) Lying  Oxygen Therapy  SpO2 92 %  O2 Device Room Air  Pain Assessment  Pain Scale 0-10  Pain Score Asleep  During Hemodialysis Assessment  Blood Flow Rate (mL/min) 400 mL/min  Arterial Pressure (mmHg) -180 mmHg  Venous Pressure (mmHg) 140 mmHg  Transmembrane Pressure (mmHg) 50 mmHg  Ultrafiltration Rate (mL/min) 830 mL/min  Dialysate Flow Rate (mL/min) 600 ml/min  Conductivity: Machine  14  HD Safety Checks Performed Yes  Dialysis Fluid Bolus Normal Saline  Bolus Amount (mL) 250 mL  Intra-Hemodialysis Comments Tx initiated (cvc crae per policy . )

## 2018-12-31 NOTE — Progress Notes (Signed)
[facility]  ROUNDING NOTE   Subjective:   Wound dehiscence and wound vac placement by Dr. Delana Meyer on 6/20.   Hemodialysis for today.   Objective:  Vital signs in last 24 hours:  Temp:  [97.6 F (36.4 C)-98.7 F (37.1 C)] 98.7 F (37.1 C) (06/20 0451) Pulse Rate:  [69-81] 70 (06/20 0830) Resp:  [12-21] 21 (06/20 0830) BP: (110-158)/(46-87) 137/64 (06/20 0830) SpO2:  [81 %-99 %] 92 % (06/20 0830)  Weight change:  Filed Weights   12/29/18 0028 12/29/18 1430 12/29/18 1730  Weight: 135.4 kg 135.4 kg 133.4 kg    Intake/Output: I/O last 3 completed shifts: In: 300 [I.V.:300] Out: -    Intake/Output this shift:  No intake/output data recorded.  Physical Exam: General: NAD,   Head: Normocephalic, atraumatic. Moist oral mucosal membranes  Eyes: Anicteric, PERRL  Neck: Supple, trachea midline  Lungs:  Clear to auscultation  Heart: Regular rate and rhythm  Abdomen:  Soft, nontender,   Extremities: + peripheral edema.  Neurologic: Nonfocal, moving all four extremities  Skin: No lesions  Access: Left AVF with wound vac RIJ permcath    Basic Metabolic Panel: Recent Labs  Lab 12/28/18 1345 12/29/18 0233 12/30/18 0230 12/30/18 1815  NA 131* 129* 129* 132*  K 4.4 4.3 3.7 3.8  CL 91* 91* 91* 91*  CO2 26 23 26 26   GLUCOSE 565* 516* 394* 225*  BUN 44* 51* 39* 49*  CREATININE 5.74* 6.37* 5.01* 5.23*  CALCIUM 7.9* 7.8* 7.5* 7.9*  MG  --   --  1.8  --   PHOS  --   --   --  5.5*    Liver Function Tests: Recent Labs  Lab 12/28/18 1345 12/30/18 1815  AST 24  --   ALT 23  --   ALKPHOS 287*  --   BILITOT 0.4  --   PROT 7.4  --   ALBUMIN 3.2* 3.0*   No results for input(s): LIPASE, AMYLASE in the last 168 hours. No results for input(s): AMMONIA in the last 168 hours.  CBC: Recent Labs  Lab 12/28/18 1345 12/29/18 0233 12/30/18 0230 12/30/18 1815  WBC 8.4 7.7 8.1 8.5  NEUTROABS 6.1  --   --   --   HGB 9.9* 9.3* 9.4* 10.1*  HCT 31.4* 29.8*  29.6* 32.0*  MCV 94.0 94.0 94.0 95.0  PLT 189 201 192 196    Cardiac Enzymes: No results for input(s): CKTOTAL, CKMB, CKMBINDEX, TROPONINI in the last 168 hours.  BNP: Invalid input(s): POCBNP  CBG: Recent Labs  Lab 12/30/18 1054 12/30/18 1359 12/30/18 1655 12/30/18 2246 12/31/18 0731  GLUCAP 275* 235* 234* 312* 319*    Microbiology: Results for orders placed or performed during the hospital encounter of 12/28/18  SARS Coronavirus 2 (CEPHEID - Performed in [place] hospital lab), Hosp Order     Status: None   Collection Time: 12/28/18  5:22 PM   Specimen: Nasopharyngeal Swab  Result Value Ref Range Status   SARS Coronavirus 2 NEGATIVE NEGATIVE Final    Comment: (NOTE) If result is NEGATIVE SARS-CoV-2 target nucleic acids are NOT DETECTED. The SARS-CoV-2 RNA is generally detectable in upper and lower  respiratory specimens during the acute phase of infection. The lowest  concentration of SARS-CoV-2 viral copies this assay can detect is 250  copies / mL. A negative result does not preclude SARS-CoV-2 infection  and should not be used as the sole basis for treatment or other  patient management decisions.  A negative result may occur with  improper specimen collection / handling, submission of specimen other  than nasopharyngeal swab, presence of viral mutation(s) within the  areas targeted by this assay, and inadequate number of viral copies  (<250 copies / mL). A negative result must be combined with clinical  observations, patient history, and epidemiological information. If result is POSITIVE SARS-CoV-2 target nucleic acids are DETECTED. The SARS-CoV-2 RNA is generally detectable in upper and lower  respiratory specimens dur ing the acute phase of infection.  Positive  results are indicative of active infection with SARS-CoV-2.  Clinical  correlation with patient history and other diagnostic information is  necessary to determine patient infection status.   Positive results do  not rule out bacterial infection or co-infection with other viruses. If result is PRESUMPTIVE POSTIVE SARS-CoV-2 nucleic acids MAY BE PRESENT.   A presumptive positive result was obtained on the submitted specimen  and confirmed on repeat testing.  While 2019 novel coronavirus  (SARS-CoV-2) nucleic acids may be present in the submitted sample  additional confirmatory testing may be necessary for epidemiological  and / or clinical management purposes  to differentiate between  SARS-CoV-2 and other Sarbecovirus currently known to infect humans.  If clinically indicated additional testing with an alternate test  methodology [phone]) is advised. The SARS-CoV-2 RNA is generally  detectable in upper and lower respiratory sp ecimens during the acute  phase of infection. The expected result is Negative. Fact Sheet for Patients:  StrictlyIdeas.no Fact Sheet for Healthcare Providers: BankingDealers.co.za This test is not yet approved or cleared by the [place] FDA and has been authorized for detection and/or diagnosis of SARS-CoV-2 by FDA under an Emergency Use Authorization (EUA).  This EUA will remain in effect (meaning this test can be used) for the duration of the COVID-19 declaration under Section 564(b)(1) of the Act, 21 U.S.C. section 360bbb-3(b)(1), unless the authorization is terminated or revoked sooner. Performed at [facility], [address] [place], [place] [address]   Blood culture (routine x 2)     Status: None (Preliminary result)   Collection Time: 12/28/18  5:40 PM   Specimen: BLOOD  Result Value Ref Range Status   Specimen Description BLOOD BLOOD RIGHT FOREARM  Final   Special Requests   Final    BOTTLES DRAWN AEROBIC AND ANAEROBIC Blood Culture results may not be optimal due to an excessive volume of blood received in culture bottles   Culture   Final    NO GROWTH 3 DAYS Performed at  [facility], [address] [place], [place] [address]    Report Status PENDING  Incomplete  MRSA PCR Screening     Status: None   Collection Time: 12/28/18 10:27 PM   Specimen: Nasal Mucosa; Nasopharyngeal  Result Value Ref Range Status   MRSA by PCR NEGATIVE NEGATIVE Final    Comment:        The GeneXpert MRSA Assay (FDA approved for NASAL specimens only), is one component of a comprehensive MRSA colonization surveillance program. It is not intended to diagnose MRSA infection nor to guide or monitor treatment for MRSA infections. Performed at [facility], [address] [place], [place] [address]   Blood culture (routine x 2)     Status: None (Preliminary result)   Collection Time: 12/28/18 11:15 PM   Specimen: BLOOD  Result Value Ref Range Status   Specimen Description BLOOD RIGHT FA  Final   Special Requests   Final  BOTTLES DRAWN AEROBIC AND ANAEROBIC Blood Culture adequate volume   Culture   Final    NO GROWTH 3 DAYS Performed at [facility], [address] [place], [place] [address]    Report Status PENDING  Incomplete    Coagulation Studies: Recent Labs    12/30/18 0230  LABPROT 13.1  INR 1.0    Urinalysis: Recent Labs    12/29/18 0427  COLORURINE YELLOW*  LABSPEC 1.020  PHURINE 5.0  GLUCOSEU 50*  HGBUR NEGATIVE  BILIRUBINUR NEGATIVE  KETONESUR NEGATIVE  PROTEINUR NEGATIVE  NITRITE NEGATIVE  LEUKOCYTESUR NEGATIVE      Imaging: No results found.   Medications:   . sodium chloride 15 mL (12/30/18 1701)  . sodium chloride Stopped (12/30/18 1501)  . ampicillin-sulbactam (UNASYN) IV 3 g (12/30/18 1702)   . allopurinol  100 mg Oral BH-q7a  . aspirin EC  81 mg Oral Daily  . atorvastatin  80 mg Oral Daily  . carvedilol  25 mg Oral BID WC  . Chlorhexidine Gluconate Cloth  6 each Topical Q0600  . epoetin (EPOGEN/PROCRIT) injection  10,000 Units Intravenous Q T,Th,Sa-HD  . ferric citrate  420 mg Oral BID WC   . heparin  5,000 Units Subcutaneous Q8H  . hydrALAZINE  100 mg Oral TID  . insulin aspart  0-15 Units Subcutaneous TID WC  . insulin aspart  0-5 Units Subcutaneous QHS  . insulin aspart  25 Units Subcutaneous TID WC  . insulin detemir  40 Units Subcutaneous BID  . loratadine  10 mg Oral QHS  . mometasone-formoterol  2 puff Inhalation BID  . montelukast  10 mg Oral QHS  . pantoprazole  40 mg Oral Daily  . pregabalin  75 mg Oral BID  . senna-docusate  1 tablet Oral QHS  . sodium chloride flush  3 mL Intravenous Once  . sodium chloride flush  3 mL Intravenous Q12H  . torsemide  40 mg Oral BID  . valACYclovir  500 mg Oral QODAY  . [START ON 01/10/2019] Vitamin D (Ergocalciferol)  50,000 Units Oral Q30 days   sodium chloride, acetaminophen **OR** acetaminophen, alum & mag hydroxide-simeth, docusate sodium, HYDROcodone-acetaminophen, hydrOXYzine, morphine injection, ondansetron **OR** ondansetron (ZOFRAN) IV, polyethylene glycol, sodium chloride flush  Assessment/ Plan:  [name] is a 63 y.o. black female with end stage renal disease on hemodialysis, diabetes mellitus type II insulin dependent, hypertension, coronary artery disease, chronic hepatitis C, peptic ulcer, obstructive sleep apnea who presents to [facility] on 12/28/2018 for AVF would infection  TTS [facility]) [facility] RIJ permcath  1. End Stage Renal Disease: hemodialysis treatment today. Orders prepared.  Complication of dialysis access: infection open wound of AVF. Status post wound dehisence and wound vac placement by Dr. Delana Meyer on 6/19.  - Empiric antibiotics: unasyn.  - Appreciate ID input.   2. Hypertension: blood pressure at goal. Home regimen of hydralazine, torsemide and  Carvedilol  3.  Anemia of chronic kidney disease: hemoglobin 10.1 - EPO with HD treatment  4. Secondary Hyperparathyroidism: Not currently on any binders.    LOS: 3 [name] 6/20/20209:02 AM

## 2018-12-31 NOTE — Progress Notes (Signed)
1 Day Post-Op   Subjective/Chief Complaint: Seen in HD. Notes LEFT arm discomfort. Controlled with current regimen.   Objective: Vital signs in last 24 hours: Temp:  [97.6 F (36.4 C)-98.8 F (37.1 C)] 98.8 F (37.1 C) (06/20 0830) Pulse Rate:  [69-81] 70 (06/20 1100) Resp:  [11-21] 13 (06/20 1100) BP: (110-158)/(49-87) 121/58 (06/20 1100) SpO2:  [81 %-99 %] 98 % (06/20 1100) Last BM Date: 12/30/18  Intake/Output from previous day: 06/19 0701 - 06/20 0700 In: 300 [I.V.:300] Out: -  Intake/Output this shift: No intake/output data recorded.  General appearance: no distress Cardio: regular rate and rhythm Extremities: LEFT arm: VAC in place. Scant drainage. No erythema, soft, appropriately tender. Hand warm.  Lab Results:  Recent Labs    12/30/18 0230 12/30/18 1815  WBC 8.1 8.5  HGB 9.4* 10.1*  HCT 29.6* 32.0*  PLT 192 196   BMET Recent Labs    12/30/18 0230 12/30/18 1815  NA 129* 132*  K 3.7 3.8  CL 91* 91*  CO2 26 26  GLUCOSE 394* 225*  BUN 39* 49*  CREATININE 5.01* 5.23*  CALCIUM 7.5* 7.9*   PT/INR Recent Labs    12/30/18 0230  LABPROT 13.1  INR 1.0   ABG No results for input(s): PHART, HCO3 in the last 72 hours.  Invalid input(s): PCO2, PO2  Studies/Results: No results found.  Anti-infectives: Anti-infectives (From admission, onward)   Start     Dose/Rate Route Frequency Ordered Stop   12/29/18 1800  Ampicillin-Sulbactam (UNASYN) 3 g in sodium chloride 0.9 % 100 mL IVPB     3 g 200 mL/hr over 30 Minutes Intravenous Every 24 hours 12/29/18 0851     12/29/18 1000  valACYclovir (VALTREX) tablet 500 mg     500 mg Oral Every other day 12/28/18 2041     12/28/18 1715  Ampicillin-Sulbactam (UNASYN) 3 g in sodium chloride 0.9 % 100 mL IVPB  Status:  Discontinued     3 g 200 mL/hr over 30 Minutes Intravenous Every 12 hours 12/28/18 1701 12/29/18 0851      Assessment/Plan: s/p Procedure(s): DEBRIDEMENT WOUND WITH VAC PLACEMENT (LEFT UPPER  EXTREMITY) (Left) Continue VAC  Keep Arm elevated on pillows as tolerated. Will change VAC on Monday.  LOS: 3 days    [name] 12/31/2018

## 2018-12-31 NOTE — Progress Notes (Signed)
[place] [facility] [place] NAME: [name]    MR#:  [id number]  DATE OF BIRTH:  12-Mar-1956  SUBJECTIVE:   Patient states she is still having a lot of pain in her left upper extremity.  She does have a wound VAC in place.  She feels like the pain medicine is helping when she gets it.  She denies any fevers or chills.  She tolerated HD today without any issues.  REVIEW OF SYSTEMS:    Review of Systems  Constitutional: Negative for chills and fever.  HENT: Negative for congestion and tinnitus.   Eyes: Negative for blurred vision and double vision.  Respiratory: Negative for cough, shortness of breath and wheezing.   Cardiovascular: Negative for chest pain, orthopnea and PND.  Gastrointestinal: Negative for abdominal pain, diarrhea, nausea and vomiting.  Genitourinary: Negative for dysuria and hematuria.  Musculoskeletal: Positive for joint pain. Negative for back pain.  Neurological: Negative for dizziness, sensory change and focal weakness.  Psychiatric/Behavioral: Negative for depression. The patient is not nervous/anxious.   All other systems reviewed and are negative.   DRUG ALLERGIES:   Allergies  Allergen Reactions  . Shellfish Allergy Anaphylaxis and Swelling  . Diazepam Other (See Comments)    "felt like out of body experience"    VITALS:  Blood pressure 134/63, pulse 75, temperature 98.7 F (37.1 C), temperature source Oral, resp. rate 16, height 5\' 8"  (1.727 m), weight 133.4 kg, SpO2 96 %.  PHYSICAL EXAMINATION:   Physical Exam  GENERAL:  63 y.o.-year-old patient lying in bed in no acute distress.  EYES: Pupils equal, round, reactive to light and accommodation. No scleral icterus. Extraocular muscles intact.  HEENT: Head atraumatic, normocephalic. Oropharynx and nasopharynx clear.  NECK:  Supple, no jugular venous distention. No thyroid enlargement, no tenderness.  LUNGS: Normal breath sounds bilaterally, no wheezing,  rales, rhonchi. No use of accessory muscles of respiration.  CARDIOVASCULAR: RRR, S1, S2 normal. No murmurs, rubs, or gallops.  ABDOMEN: Soft, nontender, nondistended. Bowel sounds present. No organomegaly or mass.  EXTREMITIES: No cyanosis, clubbing or edema b/l.   +wound vac in place over LUE. NEUROLOGIC: Cranial nerves II through XII are intact. No focal Motor or sensory deficits b/l.   PSYCHIATRIC: The patient is alert and oriented x 3.  SKIN: No obvious rash, lesion, or ulcer.    LABORATORY PANEL:   CBC Recent Labs  Lab 12/30/18 1815  WBC 8.5  HGB 10.1*  HCT 32.0*  PLT 196   ------------------------------------------------------------------------------------------------------------------  Chemistries  Recent Labs  Lab 12/28/18 1345  12/30/18 0230 12/30/18 1815  NA 131*   < > 129* 132*  K 4.4   < > 3.7 3.8  CL 91*   < > 91* 91*  CO2 26   < > 26 26  GLUCOSE 565*   < > 394* 225*  BUN 44*   < > 39* 49*  CREATININE 5.74*   < > 5.01* 5.23*  CALCIUM 7.9*   < > 7.5* 7.9*  MG  --   --  1.8  --   AST 24  --   --   --   ALT 23  --   --   --   ALKPHOS 287*  --   --   --   BILITOT 0.4  --   --   --    < > = values in this interval not displayed.   ------------------------------------------------------------------------------------------------------------------  Cardiac Enzymes No  results for input(s): TROPONINI in the last 168 hours. ------------------------------------------------------------------------------------------------------------------  RADIOLOGY:  No results found.   ASSESSMENT AND PLAN:   63 year old female with past medical history of end-stage renal disease on hemodialysis, chronic combined systolic diastolic CHF, essential hypertension, GERD, depression, coronary artery disease who presented to the hospital due to left upper extremity wound.  Nonhealing left upper extremity wound- patient was recently hospitalized and had removal of her AV  fistula/trasposition of basilic vein and also debridement of the wound with wound VAC placement. - s/p debridement of left upper extremity wound with wound VAC placement 6/19 -Vascular surgery following- planning to change wound VAC on Monday -Continue Unasyn -Blood cultures with no growth to date  ESRD on HD TTS- right chest wall PermCath in place -Nephrology following for HD  Type 2 diabetes- blood sugars have been elevated in the setting of infection -Continue Levemir 40 units twice daily -SSI  Essential hypertension- BPs well-controlled -Continue carvedilol and hydralazine.  History of combined systolic diastolic CHF- appears euvolemic. -Continue carvedilol and torsemide -Continue HD  Neuropathy- stable -Continue Lyrica.  GERD- stable -Continue Protonix.  Hyperlipidemia- stable -Continue atorvastatin.  History of gout-no signs of acute flare.   -Continue allopurinol.  All the records are reviewed and case discussed with Care Management/Social Worker. Management plans discussed with the patient, family and they are in agreement.  CODE STATUS: Full code  DVT Prophylaxis: heparin  TOTAL TIME TAKING CARE OF THIS PATIENT: 35 minutes.   POSSIBLE D/C IN 2-3 DAYS, DEPENDING ON CLINICAL CONDITION.   Berna Spare Mayo M.D on 12/31/2018 at 3:32 PM  Between 7am to 6pm - Pager [phone]  After 6pm go to [email or web address] - [employer] [facility]  Office  [phone]  CC: Primary care physician; [name], PA-C

## 2018-12-31 NOTE — Progress Notes (Signed)
Tolerated tx without complications

## 2019-01-01 ENCOUNTER — Encounter: Payer: Self-pay | Admitting: Physician Assistant

## 2019-01-01 LAB — CBC
HCT: 31.2 % — ABNORMAL LOW (ref 36.0–46.0)
Hemoglobin: 9.8 g/dL — ABNORMAL LOW (ref 12.0–15.0)
MCH: 29.9 pg (ref 26.0–34.0)
MCHC: 31.4 g/dL (ref 30.0–36.0)
MCV: 95.1 fL (ref 80.0–100.0)
Platelets: 212 10*3/uL (ref 150–400)
RBC: 3.28 MIL/uL — ABNORMAL LOW (ref 3.87–5.11)
RDW: 16.3 % — ABNORMAL HIGH (ref 11.5–15.5)
WBC: 9.4 10*3/uL (ref 4.0–10.5)
nRBC: 0.2 % (ref 0.0–0.2)

## 2019-01-01 LAB — RENAL FUNCTION PANEL
Albumin: 2.9 g/dL — ABNORMAL LOW (ref 3.5–5.0)
Anion gap: 12 (ref 5–15)
BUN: 42 mg/dL — ABNORMAL HIGH (ref 6–20)
CO2: 26 mmol/L (ref 22–32)
Calcium: 7.6 mg/dL — ABNORMAL LOW (ref 8.9–10.3)
Chloride: 94 mmol/L — ABNORMAL LOW (ref 98–111)
Creatinine, Ser: 4.65 mg/dL — ABNORMAL HIGH (ref 0.44–1.00)
GFR calc Af Amer: 11 mL/min — ABNORMAL LOW (ref >60)
GFR calc non Af Amer: 10 mL/min — ABNORMAL LOW (ref >60)
Glucose, Bld: 345 mg/dL — ABNORMAL HIGH (ref 70–99)
Phosphorus: 2.8 mg/dL (ref 2.5–4.6)
Potassium: 3.9 mmol/L (ref 3.5–5.1)
Sodium: 132 mmol/L — ABNORMAL LOW (ref 135–145)

## 2019-01-01 LAB — GLUCOSE, CAPILLARY
Glucose-Capillary: 366 mg/dL — ABNORMAL HIGH (ref 70–99)
Glucose-Capillary: 289 mg/dL — ABNORMAL HIGH (ref 70–99)
Glucose-Capillary: 294 mg/dL — ABNORMAL HIGH (ref 70–99)
Glucose-Capillary: 239 mg/dL — ABNORMAL HIGH (ref 70–99)

## 2019-01-01 IMAGING — CR DG CHEST 1V PORT
1 series · 1 of 1 positions shown · non-contrast
Comparison: Chest radiograph performed 09/03/2016

CLINICAL DATA: Acute onset of shortness of breath and generalized
chest pain. Initial encounter.

EXAM:
PORTABLE CHEST 1 VIEW

[AP]
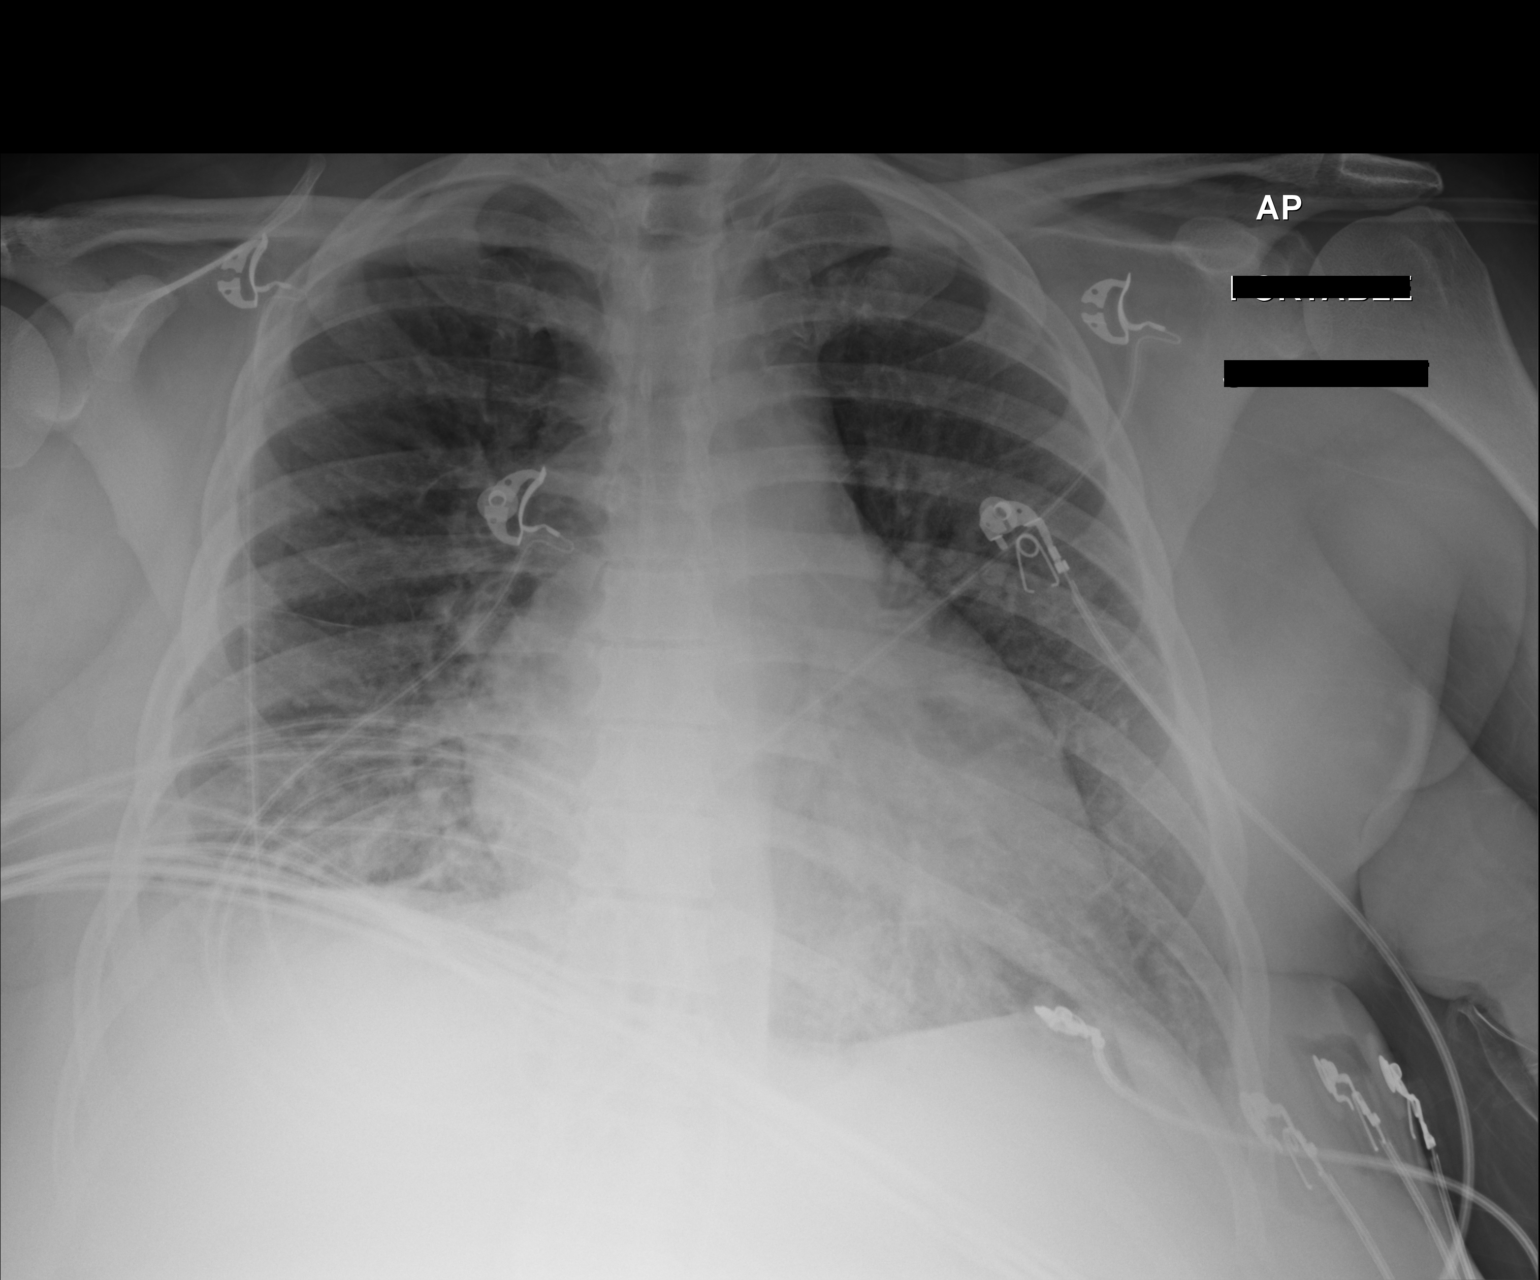

[1 of 1 positions shown; findings below may reference images not displayed]

FINDINGS: The lungs are well-aerated. Mild bibasilar opacities may reflect
mild pneumonia. There is no evidence of pleural effusion or
pneumothorax.

The cardiomediastinal silhouette is borderline normal in size. No
acute osseous abnormalities are seen.
IMPRESSION: Mild bibasilar airspace opacities raise concern for mild pneumonia.

## 2019-01-01 MED ORDER — INSULIN ASPART 100 UNIT/ML ~~LOC~~ SOLN
0.0000 [IU] | Freq: Three times a day (TID) | SUBCUTANEOUS | Status: DC
  Administered 2019-01-01 (×2): 11 [IU] via SUBCUTANEOUS
  Administered 2019-01-02: 4 [IU] via SUBCUTANEOUS
  Administered 2019-01-02: 7 [IU] via SUBCUTANEOUS
  Administered 2019-01-02: 4 [IU] via SUBCUTANEOUS
  Filled 2019-01-01 (×5): qty 1

## 2019-01-01 MED ORDER — INSULIN ASPART 100 UNIT/ML ~~LOC~~ SOLN
0.0000 [IU] | Freq: Every day | SUBCUTANEOUS | Status: DC
  Administered 2019-01-01: 2 [IU] via SUBCUTANEOUS
  Filled 2019-01-01: qty 1

## 2019-01-01 NOTE — Progress Notes (Signed)
[facility]  ROUNDING NOTE   Subjective:   Hemodialysis treatment yesterday. Tolerated treatment well. UF 2 liters.   Objective:  Vital signs in last 24 hours:  Temp:  [98.2 F (36.8 C)-99.2 F (37.3 C)] 99.2 F (37.3 C) (06/21 1131) Pulse Rate:  [72-83] 72 (06/21 1131) Resp:  [16-18] 16 (06/21 1131) BP: (114-128)/(49-87) 128/87 (06/21 1131) SpO2:  [91 %-97 %] 93 % (06/21 1131)  Weight change:  Filed Weights   12/29/18 0028 12/29/18 1430 12/29/18 1730  Weight: 135.4 kg 135.4 kg 133.4 kg    Intake/Output: I/O last 3 completed shifts: In: 240 [P.O.:240] Out: 2000 [Other:2000]   Intake/Output this shift:  Total I/O In: 240 [P.O.:240] Out: -   Physical Exam: General: NAD,   Head: Normocephalic, atraumatic. Moist oral mucosal membranes  Eyes: Anicteric, PERRL  Neck: Supple, trachea midline  Lungs:  Clear to auscultation  Heart: Regular rate and rhythm  Abdomen:  Soft, nontender,   Extremities: + peripheral edema.  Neurologic: Nonfocal, moving all four extremities  Skin: No lesions  Access: Left AVF with wound vac RIJ permcath    Basic Metabolic Panel: Recent Labs  Lab 12/28/18 1345 12/29/18 0233 12/30/18 0230 12/30/18 1815 01/01/19 0419  NA 131* 129* 129* 132* 132*  K 4.4 4.3 3.7 3.8 3.9  CL 91* 91* 91* 91* 94*  CO2 26 23 26 26 26   GLUCOSE 565* 516* 394* 225* 345*  BUN 44* 51* 39* 49* 42*  CREATININE 5.74* 6.37* 5.01* 5.23* 4.65*  CALCIUM 7.9* 7.8* 7.5* 7.9* 7.6*  MG  --   --  1.8  --   --   PHOS  --   --   --  5.5* 2.8    Liver Function Tests: Recent Labs  Lab 12/28/18 1345 12/30/18 1815 01/01/19 0419  AST 24  --   --   ALT 23  --   --   ALKPHOS 287*  --   --   BILITOT 0.4  --   --   PROT 7.4  --   --   ALBUMIN 3.2* 3.0* 2.9*   No results for input(s): LIPASE, AMYLASE in the last 168 hours. No results for input(s): AMMONIA in the last 168 hours.  CBC: Recent Labs  Lab 12/28/18 1345 12/29/18 0233 12/30/18 0230  12/30/18 1815 01/01/19 0419  WBC 8.4 7.7 8.1 8.5 9.4  NEUTROABS 6.1  --   --   --   --   HGB 9.9* 9.3* 9.4* 10.1* 9.8*  HCT 31.4* 29.8* 29.6* 32.0* 31.2*  MCV 94.0 94.0 94.0 95.0 95.1  PLT 189 201 192 196 212    Cardiac Enzymes: No results for input(s): CKTOTAL, CKMB, CKMBINDEX, TROPONINI in the last 168 hours.  BNP: Invalid input(s): POCBNP  CBG: Recent Labs  Lab 12/31/18 0731 12/31/18 1634 12/31/18 2139 01/01/19 0742 01/01/19 1129  GLUCAP 319* 235* 298* 366* 289*    Microbiology: Results for orders placed or performed during the hospital encounter of 12/28/18  SARS Coronavirus 2 (CEPHEID - Performed in [place] hospital lab), Hosp Order     Status: None   Collection Time: 12/28/18  5:22 PM   Specimen: Nasopharyngeal Swab  Result Value Ref Range Status   SARS Coronavirus 2 NEGATIVE NEGATIVE Final    Comment: (NOTE) If result is NEGATIVE SARS-CoV-2 target nucleic acids are NOT DETECTED. The SARS-CoV-2 RNA is generally detectable in upper and lower  respiratory specimens during the acute phase of infection. The lowest  concentration of SARS-CoV-2  viral copies this assay can detect is 250  copies / mL. A negative result does not preclude SARS-CoV-2 infection  and should not be used as the sole basis for treatment or other  patient management decisions.  A negative result may occur with  improper specimen collection / handling, submission of specimen other  than nasopharyngeal swab, presence of viral mutation(s) within the  areas targeted by this assay, and inadequate number of viral copies  (<250 copies / mL). A negative result must be combined with clinical  observations, patient history, and epidemiological information. If result is POSITIVE SARS-CoV-2 target nucleic acids are DETECTED. The SARS-CoV-2 RNA is generally detectable in upper and lower  respiratory specimens dur ing the acute phase of infection.  Positive  results are indicative of active infection  with SARS-CoV-2.  Clinical  correlation with patient history and other diagnostic information is  necessary to determine patient infection status.  Positive results do  not rule out bacterial infection or co-infection with other viruses. If result is PRESUMPTIVE POSTIVE SARS-CoV-2 nucleic acids MAY BE PRESENT.   A presumptive positive result was obtained on the submitted specimen  and confirmed on repeat testing.  While 2019 novel coronavirus  (SARS-CoV-2) nucleic acids may be present in the submitted sample  additional confirmatory testing may be necessary for epidemiological  and / or clinical management purposes  to differentiate between  SARS-CoV-2 and other Sarbecovirus currently known to infect humans.  If clinically indicated additional testing with an alternate test  methodology [phone]) is advised. The SARS-CoV-2 RNA is generally  detectable in upper and lower respiratory sp ecimens during the acute  phase of infection. The expected result is Negative. Fact Sheet for Patients:  StrictlyIdeas.no Fact Sheet for Healthcare Providers: BankingDealers.co.za This test is not yet approved or cleared by the [place] FDA and has been authorized for detection and/or diagnosis of SARS-CoV-2 by FDA under an Emergency Use Authorization (EUA).  This EUA will remain in effect (meaning this test can be used) for the duration of the COVID-19 declaration under Section 564(b)(1) of the Act, 21 U.S.C. section 360bbb-3(b)(1), unless the authorization is terminated or revoked sooner. Performed at [facility], [address] [place], [place] [address]   Blood culture (routine x 2)     Status: None (Preliminary result)   Collection Time: 12/28/18  5:40 PM   Specimen: BLOOD  Result Value Ref Range Status   Specimen Description BLOOD BLOOD RIGHT FOREARM  Final   Special Requests   Final    BOTTLES DRAWN AEROBIC AND ANAEROBIC Blood  Culture results may not be optimal due to an excessive volume of blood received in culture bottles   Culture   Final    NO GROWTH 4 DAYS Performed at [facility], [address], [place], [place] [address]    Report Status PENDING  Incomplete  MRSA PCR Screening     Status: None   Collection Time: 12/28/18 10:27 PM   Specimen: Nasal Mucosa; Nasopharyngeal  Result Value Ref Range Status   MRSA by PCR NEGATIVE NEGATIVE Final    Comment:        The GeneXpert MRSA Assay (FDA approved for NASAL specimens only), is one component of a comprehensive MRSA colonization surveillance program. It is not intended to diagnose MRSA infection nor to guide or monitor treatment for MRSA infections. Performed at [facility], [address] [place], [place] [address]   Blood culture (routine x 2)  Status: None (Preliminary result)   Collection Time: 12/28/18 11:15 PM   Specimen: BLOOD  Result Value Ref Range Status   Specimen Description BLOOD RIGHT FA  Final   Special Requests   Final    BOTTLES DRAWN AEROBIC AND ANAEROBIC Blood Culture adequate volume   Culture   Final    NO GROWTH 4 DAYS Performed at [facility], [address], [place], [place] [address]    Report Status PENDING  Incomplete    Coagulation Studies: Recent Labs    12/30/18 0230  LABPROT 13.1  INR 1.0    Urinalysis: No results for input(s): COLORURINE, LABSPEC, PHURINE, GLUCOSEU, HGBUR, BILIRUBINUR, KETONESUR, PROTEINUR, UROBILINOGEN, NITRITE, LEUKOCYTESUR in the last 72 hours.  Invalid input(s): APPERANCEUR    Imaging: No results found.   Medications:   . sodium chloride 15 mL (12/30/18 1701)  . sodium chloride Stopped (12/30/18 1501)  . ampicillin-sulbactam (UNASYN) IV 3 g (12/31/18 1757)   . allopurinol  100 mg Oral BH-q7a  . aspirin EC  81 mg Oral Daily  . atorvastatin  80 mg Oral Daily  . carvedilol  25 mg Oral BID WC  . Chlorhexidine Gluconate Cloth  6 each  Topical Q0600  . epoetin (EPOGEN/PROCRIT) injection  10,000 Units Intravenous Q T,Th,Sa-HD  . ferric citrate  420 mg Oral BID WC  . heparin  5,000 Units Subcutaneous Q8H  . hydrALAZINE  100 mg Oral TID  . insulin aspart  0-20 Units Subcutaneous TID WC  . insulin aspart  0-5 Units Subcutaneous QHS  . insulin aspart  25 Units Subcutaneous TID WC  . insulin detemir  40 Units Subcutaneous BID  . loratadine  10 mg Oral QHS  . mometasone-formoterol  2 puff Inhalation BID  . montelukast  10 mg Oral QHS  . pantoprazole  40 mg Oral Daily  . pregabalin  75 mg Oral BID  . senna-docusate  1 tablet Oral QHS  . sodium chloride flush  3 mL Intravenous Once  . sodium chloride flush  3 mL Intravenous Q12H  . torsemide  40 mg Oral BID  . valACYclovir  500 mg Oral QODAY  . [START ON 01/10/2019] Vitamin D (Ergocalciferol)  50,000 Units Oral Q30 days   sodium chloride, acetaminophen **OR** acetaminophen, alum & mag hydroxide-simeth, docusate sodium, HYDROcodone-acetaminophen, hydrOXYzine, morphine injection, ondansetron **OR** ondansetron (ZOFRAN) IV, polyethylene glycol, sodium chloride flush  Assessment/ Plan:  [name] is a 63 y.o. black female with end stage renal disease on hemodialysis, diabetes mellitus type II insulin dependent, hypertension, coronary artery disease, chronic hepatitis C, peptic ulcer, obstructive sleep apnea who presents to [facility] on 12/28/2018 for AVF would infection  TTS [facility]) [facility] RIJ permcath  1. End Stage Renal Disease: hemodialysis treatment yesterday.  Complication of dialysis access: infection open wound of AVF. Status post wound dehisence and wound vac placement by Dr. Delana Meyer on 6/19.  - Empiric antibiotics: unasyn.  - Appreciate ID input.   2. Hypertension: blood pressure at goal. Home regimen of hydralazine, torsemide and carvedilol  3.  Anemia of chronic kidney disease: hemoglobin 9.8 - EPO with HD treatment  4.  Secondary Hyperparathyroidism: Not currently on any binders.    LOS: [place] 6/21/20201:34 PM

## 2019-01-01 NOTE — Plan of Care (Addendum)
Pain continues to be an issue yet the patient was able to have more mobility to her left arm. No falls. Voiding. Wound VAC suction set to 149mmHg. Wound VAC output 26ml during day shift. IV antibiotics provided.  Problem: Education: Goal: Knowledge of General Education information will improve Description: Including pain rating scale, medication(s)/side effects and non-pharmacologic comfort measures Outcome: Progressing   Problem: Health Behavior/Discharge Planning: Goal: Ability to manage health-related needs will improve Outcome: Progressing   Problem: Clinical Measurements: Goal: Ability to maintain clinical measurements within normal limits will improve Outcome: Progressing Goal: Will remain free from infection Outcome: Progressing Goal: Diagnostic test results will improve Outcome: Progressing Goal: Respiratory complications will improve Outcome: Progressing Goal: Cardiovascular complication will be avoided Outcome: Progressing   Problem: Activity: Goal: Risk for activity intolerance will decrease Outcome: Progressing   Problem: Nutrition: Goal: Adequate nutrition will be maintained Outcome: Progressing   Problem: Coping: Goal: Level of anxiety will decrease Outcome: Progressing   Problem: Elimination: Goal: Will not experience complications related to bowel motility Outcome: Progressing Goal: Will not experience complications related to urinary retention Outcome: Progressing   Problem: Pain Managment: Goal: General experience of comfort will improve Outcome: Progressing   Problem: Safety: Goal: Ability to remain free from injury will improve Outcome: Progressing   Problem: Skin Integrity: Goal: Risk for impaired skin integrity will decrease Outcome: Progressing

## 2019-01-01 NOTE — Progress Notes (Signed)
2 Days Post-Op   Subjective/Chief Complaint: Doing much better. States she now has improved range of motion in the LEFT arm. Pain well controlled. Anxious to go home.   Objective: Vital signs in last 24 hours: Temp:  [98.2 F (36.8 C)-98.8 F (37.1 C)] 98.7 F (37.1 C) (06/21 0843) Pulse Rate:  [70-83] 73 (06/21 0843) Resp:  [11-23] 18 (06/21 0843) BP: (111-134)/(49-72) 114/49 (06/21 0843) SpO2:  [91 %-99 %] 96 % (06/21 0843) Last BM Date: 12/31/18  Intake/Output from previous day: 06/20 0701 - 06/21 0700 In: 240 [P.O.:240] Out: 2000  Intake/Output this shift: No intake/output data recorded.  General appearance: alert and no distress Cardio: regular rate and rhythm Extremities: . LEFT arm: VAC in place. Scant drainage. No erythema, soft, appropriately tender. Hand warm.  Lab Results:  Recent Labs    12/30/18 1815 01/01/19 0419  WBC 8.5 9.4  HGB 10.1* 9.8*  HCT 32.0* 31.2*  PLT 196 212   BMET Recent Labs    12/30/18 1815 01/01/19 0419  NA 132* 132*  K 3.8 3.9  CL 91* 94*  CO2 26 26  GLUCOSE 225* 345*  BUN 49* 42*  CREATININE 5.23* 4.65*  CALCIUM 7.9* 7.6*   PT/INR Recent Labs    12/30/18 0230  LABPROT 13.1  INR 1.0   ABG No results for input(s): PHART, HCO3 in the last 72 hours.  Invalid input(s): PCO2, PO2  Studies/Results: No results found.  Anti-infectives: Anti-infectives (From admission, onward)   Start     Dose/Rate Route Frequency Ordered Stop   12/29/18 1800  Ampicillin-Sulbactam (UNASYN) 3 g in sodium chloride 0.9 % 100 mL IVPB     3 g 200 mL/hr over 30 Minutes Intravenous Every 24 hours 12/29/18 0851     12/29/18 1000  valACYclovir (VALTREX) tablet 500 mg     500 mg Oral Every other day 12/28/18 2041     12/28/18 1715  Ampicillin-Sulbactam (UNASYN) 3 g in sodium chloride 0.9 % 100 mL IVPB  Status:  Discontinued     3 g 200 mL/hr over 30 Minutes Intravenous Every 12 hours 12/28/18 1701 12/29/18 0851      Assessment/Plan: s/p  Procedure(s): DEBRIDEMENT WOUND WITH VAC PLACEMENT (LEFT UPPER EXTREMITY) (Left)  Keep arm elevated. Will change VAC tomorrow.  Will need home VAC arranged  LOS: 4 days    Jamesetta So A 01/01/2019

## 2019-01-01 NOTE — Progress Notes (Signed)
[place] [facility] [place] NAME: [name]    MR#:  [id number]  DATE OF BIRTH:  [birth year]  SUBJECTIVE:   Patient states she is still having a decent amount of left arm pain today, but her pain is much improved from yesterday.  She states today is the first day that she can actually move her arm around without severe pain.  She denies any fevers or chills.  REVIEW OF SYSTEMS:    Review of Systems  Constitutional: Negative for chills and fever.  HENT: Negative for congestion and tinnitus.   Eyes: Negative for blurred vision and double vision.  Respiratory: Negative for cough, shortness of breath and wheezing.   Cardiovascular: Negative for chest pain, orthopnea and PND.  Gastrointestinal: Negative for abdominal pain, diarrhea, nausea and vomiting.  Genitourinary: Negative for dysuria and hematuria.  Musculoskeletal: Positive for joint pain. Negative for back pain.  Neurological: Negative for dizziness, sensory change and focal weakness.  Psychiatric/Behavioral: Negative for depression. The patient is not nervous/anxious.   All other systems reviewed and are negative.   DRUG ALLERGIES:   Allergies  Allergen Reactions  . Shellfish Allergy Anaphylaxis and Swelling  . Diazepam Other (See Comments)    "felt like out of body experience"    VITALS:  Blood pressure 128/87, pulse 72, temperature 99.2 F (37.3 C), temperature source Oral, resp. rate 16, height 5\' 8"  (1.727 m), weight 133.4 kg, SpO2 93 %.  PHYSICAL EXAMINATION:   Physical Exam  GENERAL:  63 y.o.-year-old patient lying in bed in no acute distress.  EYES: Pupils equal, round, reactive to light and accommodation. No scleral icterus. Extraocular muscles intact.  HEENT: Head atraumatic, normocephalic. Oropharynx and nasopharynx clear.  NECK:  Supple, no jugular venous distention. No thyroid enlargement, no tenderness.  LUNGS: Normal breath sounds bilaterally, no wheezing, rales,  rhonchi. No use of accessory muscles of respiration.  CARDIOVASCULAR: RRR, S1, S2 normal. No murmurs, rubs, or gallops.  ABDOMEN: Soft, nontender, nondistended. Bowel sounds present. No organomegaly or mass.  EXTREMITIES: No cyanosis, clubbing or edema b/l.   +wound vac in place over LUE. NEUROLOGIC: Cranial nerves II through XII are intact. No focal Motor or sensory deficits b/l.   PSYCHIATRIC: The patient is alert and oriented x 3.  SKIN: No obvious rash, lesion, or ulcer.    LABORATORY PANEL:   CBC Recent Labs  Lab 01/01/19 0419  WBC 9.4  HGB 9.8*  HCT 31.2*  PLT 212   ------------------------------------------------------------------------------------------------------------------  Chemistries  Recent Labs  Lab 12/28/18 1345  12/30/18 0230  01/01/19 0419  NA 131*   < > 129*   < > 132*  K 4.4   < > 3.7   < > 3.9  CL 91*   < > 91*   < > 94*  CO2 26   < > 26   < > 26  GLUCOSE 565*   < > 394*   < > 345*  BUN 44*   < > 39*   < > 42*  CREATININE 5.74*   < > 5.01*   < > 4.65*  CALCIUM 7.9*   < > 7.5*   < > 7.6*  MG  --   --  1.8  --   --   AST 24  --   --   --   --   ALT 23  --   --   --   --   ALKPHOS 287*  --   --   --   --  BILITOT 0.4  --   --   --   --    < > = values in this interval not displayed.   ------------------------------------------------------------------------------------------------------------------  Cardiac Enzymes No results for input(s): TROPONINI in the last 168 hours. ------------------------------------------------------------------------------------------------------------------  RADIOLOGY:  No results found.   ASSESSMENT AND PLAN:   Nonhealing left upper extremity wound- patient was recently hospitalized and had removal of her AV fistula/trasposition of basilic vein and also debridement of the wound with wound VAC placement. -s/p debridement of left upper extremity wound with wound VAC placement 6/19 -Vascular surgery following-  planning to change wound VAC tomorrow -Continue Unasyn- will discuss duration of therapy with ID tomorrow -Blood cultures with no growth to date  ESRD on HD TTS- right chest wall PermCath in place -Nephrology following for HD  Type 2 diabetes- blood sugars have been elevated in the setting of infection -Continue Levemir 40 units twice daily -SSI  Essential hypertension- BPs well-controlled -Continue carvedilol and hydralazine.  History of combined systolic diastolic CHF- appears euvolemic. -Continue carvedilol and torsemide -Continue HD  Neuropathy- stable -Continue Lyrica.  GERD- stable -Continue Protonix.  Hyperlipidemia- stable -Continue atorvastatin.  History of gout-no signs of acute flare.   -Continue allopurinol.  Plan for likely discharge tomorrow with home health.  All the records are reviewed and case discussed with Care Management/Social Worker. Management plans discussed with the patient, family and they are in agreement.  CODE STATUS: Full code  DVT Prophylaxis: heparin  TOTAL TIME TAKING CARE OF THIS PATIENT: 35 minutes.   POSSIBLE D/C tomorrow, DEPENDING ON CLINICAL CONDITION.   Berna Spare Kinslee Dalpe M.D on 01/01/2019 at 1:10 PM  Between 7am to 6pm - Pager - [phone]  After 6pm go to [email or web address] - Technical brewer [facility]  Office  [phone]  CC: Primary care physician; [name], PA-C

## 2019-01-02 DIAGNOSIS — Z87891 Personal history of nicotine dependence: Secondary | ICD-10-CM

## 2019-01-02 DIAGNOSIS — B9689 Other specified bacterial agents as the cause of diseases classified elsewhere: Secondary | ICD-10-CM

## 2019-01-02 DIAGNOSIS — I120 Hypertensive chronic kidney disease with stage 5 chronic kidney disease or end stage renal disease: Secondary | ICD-10-CM

## 2019-01-02 DIAGNOSIS — I2510 Atherosclerotic heart disease of native coronary artery without angina pectoris: Secondary | ICD-10-CM

## 2019-01-02 DIAGNOSIS — Z91013 Allergy to seafood: Secondary | ICD-10-CM

## 2019-01-02 DIAGNOSIS — B182 Chronic viral hepatitis C: Secondary | ICD-10-CM

## 2019-01-02 DIAGNOSIS — B963 Hemophilus influenzae [H. influenzae] as the cause of diseases classified elsewhere: Secondary | ICD-10-CM

## 2019-01-02 DIAGNOSIS — Z978 Presence of other specified devices: Secondary | ICD-10-CM

## 2019-01-02 DIAGNOSIS — Z888 Allergy status to other drugs, medicaments and biological substances status: Secondary | ICD-10-CM

## 2019-01-02 DIAGNOSIS — E1122 Type 2 diabetes mellitus with diabetic chronic kidney disease: Secondary | ICD-10-CM

## 2019-01-02 DIAGNOSIS — Z992 Dependence on renal dialysis: Secondary | ICD-10-CM

## 2019-01-02 DIAGNOSIS — S51832A Puncture wound without foreign body of left forearm, initial encounter: Secondary | ICD-10-CM | POA: Insufficient documentation

## 2019-01-02 DIAGNOSIS — T827XXA Infection and inflammatory reaction due to other cardiac and vascular devices, implants and grafts, initial encounter: Secondary | ICD-10-CM

## 2019-01-02 DIAGNOSIS — L089 Local infection of the skin and subcutaneous tissue, unspecified: Secondary | ICD-10-CM

## 2019-01-02 DIAGNOSIS — B952 Enterococcus as the cause of diseases classified elsewhere: Secondary | ICD-10-CM

## 2019-01-02 DIAGNOSIS — G4733 Obstructive sleep apnea (adult) (pediatric): Secondary | ICD-10-CM

## 2019-01-02 DIAGNOSIS — N186 End stage renal disease: Secondary | ICD-10-CM

## 2019-01-02 LAB — CULTURE, BLOOD (ROUTINE X 2)
Culture: NO GROWTH
Culture: NO GROWTH
Special Requests: ADEQUATE

## 2019-01-02 LAB — GLUCOSE, CAPILLARY
Glucose-Capillary: 132 mg/dL — ABNORMAL HIGH (ref 70–99)
Glucose-Capillary: 201 mg/dL — ABNORMAL HIGH (ref 70–99)
Glucose-Capillary: 203 mg/dL — ABNORMAL HIGH (ref 70–99)
Glucose-Capillary: 176 mg/dL — ABNORMAL HIGH (ref 70–99)

## 2019-01-02 LAB — CBC
HCT: 29 % — ABNORMAL LOW (ref 36.0–46.0)
Hemoglobin: 9 g/dL — ABNORMAL LOW (ref 12.0–15.0)
MCH: 29.3 pg (ref 26.0–34.0)
MCHC: 31 g/dL (ref 30.0–36.0)
MCV: 94.5 fL (ref 80.0–100.0)
Platelets: 201 10*3/uL (ref 150–400)
RBC: 3.07 MIL/uL — ABNORMAL LOW (ref 3.87–5.11)
RDW: 16.3 % — ABNORMAL HIGH (ref 11.5–15.5)
WBC: 8.8 10*3/uL (ref 4.0–10.5)
nRBC: 0 % (ref 0.0–0.2)

## 2019-01-02 LAB — BASIC METABOLIC PANEL
Anion gap: 11 (ref 5–15)
BUN: 63 mg/dL — ABNORMAL HIGH (ref 6–20)
CO2: 28 mmol/L (ref 22–32)
Calcium: 8.3 mg/dL — ABNORMAL LOW (ref 8.9–10.3)
Chloride: 96 mmol/L — ABNORMAL LOW (ref 98–111)
Creatinine, Ser: 6.23 mg/dL — ABNORMAL HIGH (ref 0.44–1.00)
GFR calc Af Amer: 8 mL/min — ABNORMAL LOW (ref >60)
GFR calc non Af Amer: 7 mL/min — ABNORMAL LOW (ref >60)
Glucose, Bld: 226 mg/dL — ABNORMAL HIGH (ref 70–99)
Potassium: 4.5 mmol/L (ref 3.5–5.1)
Sodium: 135 mmol/L (ref 135–145)

## 2019-01-02 MED ORDER — TORSEMIDE 20 MG PO TABS: 40.0000 mg | ORAL_TABLET | Freq: Two times a day (BID) | ORAL | 0 refills | Status: DC

## 2019-01-02 MED ORDER — CEFTAZIDIME SODIUM IN D5W 1-5 GM/50ML-% IV SOLN: 1.0000 g | INTRAVENOUS | 0 refills | Status: AC | PRN

## 2019-01-02 MED ORDER — METRONIDAZOLE 500 MG PO TABS: 500.0000 mg | ORAL_TABLET | Freq: Three times a day (TID) | ORAL | 0 refills | Status: AC

## 2019-01-02 MED ORDER — INSULIN GLARGINE 100 UNITS/ML SOLOSTAR PEN: 60.0000 [IU] | PEN_INJECTOR | Freq: Every day | SUBCUTANEOUS | 0 refills | Status: DC

## 2019-01-02 MED ORDER — VANCOMYCIN HCL 10 G IV SOLR: 1750.0000 mg | INTRAVENOUS | 0 refills | Status: AC | PRN

## 2019-01-02 MED ORDER — METRONIDAZOLE 500 MG PO TABS: 500.0000 mg | ORAL_TABLET | Freq: Three times a day (TID) | ORAL | 0 refills | Status: DC

## 2019-01-02 MED ORDER — OXYCODONE HCL 5 MG PO TABS: 5.0000 mg | ORAL_TABLET | Freq: Four times a day (QID) | ORAL | 0 refills | Status: DC | PRN

## 2019-01-02 NOTE — TOC Transition Note (Signed)
Transition of Care [facility]) - CM/SW Discharge Note   Patient Details  Name: [name] MRN: [id number] Date of Birth: 1955-10-25  Transition of Care [facility]) CM/SW Contact:  Beverly Sessions, RN Phone Number: 01/02/2019, 4:54 PM   Clinical Narrative:    Patient to discharge home today.  Family to transport at discharge [facility] with [place] notified of discharge  Home wound vac (apria) was placed at bedside.  Jeneen Rinks with [facility] notified that patient would need additional supplies sent to the home for wound vac change on Wednesday.   [place] to provide start of care on Wednesday  Amanda [place] dialysis liaison notified of discharge.  Patient will require antibiotics with HD.  Per MD note he has already called in the medication to the clinic    Final next level of care: [place] Barriers to Discharge: Barriers Resolved   Patient Goals and CMS Choice Patient states their goals for this hospitalization and ongoing recovery are:: to get home CMS Medicare.gov Compare Post Acute Care list provided to:: Patient Choice offered to / list presented to : Patient  Discharge Placement                       Discharge Plan and Services   Discharge Planning Services: CM Consult                      HH Arranged: RN, Nurse's Aide [facility] Agency: [place] ([facility]) Date [facility] Agency Contacted: 01/02/19 Time [place]: 1200 Representative spoke with at [place]: [place] ([place]) Interventions     Readmission Risk Interventions Readmission Risk Prevention Plan 12/30/2018  Transportation Screening Complete  HRI or Home Care Consult Complete  Palliative Care Screening Not Applicable  Medication Review (RN Care Manager) Complete  Some recent data might be hidden

## 2019-01-02 NOTE — Consult Note (Signed)
Pharmacy Antibiotic Note  [name] is a 64 y.o. female admitted on 12/28/2018 with a nonhealing wound at site of previous AVG. Pharmacy has been consulted for Unasyn dosing. Cultures from 6/10 grew: prevotella, H. Parainfluenza, and E. Faecalis (amp susc). Seen by vascular surgery and status post wound dehiscence with cleanup of the wound and wound VAC placement in the operating room today. This is day # 6 of antibiotic therapy  She is on HD TTS  Plan: Continue ampicillin/sulbactam 3 g IV q24h with dose given after HD on HD days  Height: 5\' 8"  (172.7 cm) Weight: 294 lb 1.5 oz (133.4 kg) IBW/kg (Calculated) : 63.9  Temp (24hrs), Avg:98.7 F (37.1 C), Min:97.8 F (36.6 C), Max:99.2 F (37.3 C)  Recent Labs  Lab 12/28/18 1345 12/28/18 1740 12/29/18 0233 12/30/18 0230 12/30/18 1815 01/01/19 0419 01/02/19 0638  WBC 8.4  --  7.7 8.1 8.5 9.4 8.8  CREATININE 5.74*  --  6.37* 5.01* 5.23* 4.65* 6.23*  LATICACIDVEN 1.8 1.6  --   --   --   --   --     Estimated Creatinine Clearance: 13.9 mL/min (A) (by C-G formula based on SCr of 6.23 mg/dL (H)).    Antimicrobials this admission: Unasyn 6/17 >>   Microbiology results: 6/17 BCx: NGF 6/10 WCx: E faecalis  6/17 SARS CoV-2: negative  6/17 MRSA PCR: negative  Thank you for allowing pharmacy to be a part of this patient's care.  Rocky Morel, PharmD 01/02/2019 9:09 AM

## 2019-01-02 NOTE — Plan of Care (Signed)

## 2019-01-02 NOTE — Progress Notes (Signed)
[facility]  ROUNDING NOTE   Subjective:   Patient is doing fair Denies any acute shortness of breath, nausea or vomiting   Objective:  Vital signs in last 24 hours:  Temp:  [97.8 F (36.6 C)-99 F (37.2 C)] 98.6 F (37 C) (06/22 1131) Pulse Rate:  [73-76] 73 (06/22 1131) Resp:  [16-18] 18 (06/22 0837) BP: (119-166)/(54-99) 119/54 (06/22 1131) SpO2:  [94 %-99 %] 94 % (06/22 1131)  Weight change:  Filed Weights   12/29/18 0028 12/29/18 1430 12/29/18 1730  Weight: 135.4 kg 135.4 kg 133.4 kg    Intake/Output: I/O last 3 completed shifts: In: 1304.7 [P.O.:960; I.V.:44.7; IV Piggyback:300] Out: 20 [Drains:20]   Intake/Output this shift:  Total I/O In: 480 [P.O.:480] Out: 0   Physical Exam: General: NAD, morbidly obese  Head: Normocephalic, atraumatic. Moist oral mucosal membranes  Eyes: Anicteric,   Neck: Supple,   Lungs:  Clear to auscultation  Heart: Regular rate and rhythm  Abdomen:  Soft, nontender,   Extremities: + peripheral edema.  Neurologic: Nonfocal, moving all four extremities  Skin: No lesions  Access: Left AVF with wound;  RIJ permcath    Basic Metabolic Panel: Recent Labs  Lab 12/29/18 0233 12/30/18 0230 12/30/18 1815 01/01/19 0419 01/02/19 0638  NA 129* 129* 132* 132* 135  K 4.3 3.7 3.8 3.9 4.5  CL 91* 91* 91* 94* 96*  CO2 23 26 26 26 28   GLUCOSE 516* 394* 225* 345* 226*  BUN 51* 39* 49* 42* 63*  CREATININE 6.37* 5.01* 5.23* 4.65* 6.23*  CALCIUM 7.8* 7.5* 7.9* 7.6* 8.3*  MG  --  1.8  --   --   --   PHOS  --   --  5.5* 2.8  --     Liver Function Tests: Recent Labs  Lab 12/28/18 1345 12/30/18 1815 01/01/19 0419  AST 24  --   --   ALT 23  --   --   ALKPHOS 287*  --   --   BILITOT 0.4  --   --   PROT 7.4  --   --   ALBUMIN 3.2* 3.0* 2.9*   No results for input(s): LIPASE, AMYLASE in the last 168 hours. No results for input(s): AMMONIA in the last 168 hours.  CBC: Recent Labs  Lab 12/28/18 1345 12/29/18 0233  12/30/18 0230 12/30/18 1815 01/01/19 0419 01/02/19 0638  WBC 8.4 7.7 8.1 8.5 9.4 8.8  NEUTROABS 6.1  --   --   --   --   --   HGB 9.9* 9.3* 9.4* 10.1* 9.8* 9.0*  HCT 31.4* 29.8* 29.6* 32.0* 31.2* 29.0*  MCV 94.0 94.0 94.0 95.0 95.1 94.5  PLT 189 201 192 196 212 201    Cardiac Enzymes: No results for input(s): CKTOTAL, CKMB, CKMBINDEX, TROPONINI in the last 168 hours.  BNP: Invalid input(s): POCBNP  CBG: Recent Labs  Lab 01/01/19 1129 01/01/19 1633 01/01/19 2201 01/02/19 0727 01/02/19 1128  GLUCAP 289* 294* 239* 201* 203*    Microbiology: Results for orders placed or performed during the hospital encounter of 12/28/18  SARS Coronavirus 2 (CEPHEID - Performed in [place] hospital lab), Hosp Order     Status: None   Collection Time: 12/28/18  5:22 PM   Specimen: Nasopharyngeal Swab  Result Value Ref Range Status   SARS Coronavirus 2 NEGATIVE NEGATIVE Final    Comment: (NOTE) If result is NEGATIVE SARS-CoV-2 target nucleic acids are NOT DETECTED. The SARS-CoV-2 RNA is generally detectable in upper  and lower  respiratory specimens during the acute phase of infection. The lowest  concentration of SARS-CoV-2 viral copies this assay can detect is 250  copies / mL. A negative result does not preclude SARS-CoV-2 infection  and should not be used as the sole basis for treatment or other  patient management decisions.  A negative result may occur with  improper specimen collection / handling, submission of specimen other  than nasopharyngeal swab, presence of viral mutation(s) within the  areas targeted by this assay, and inadequate number of viral copies  (<250 copies / mL). A negative result must be combined with clinical  observations, patient history, and epidemiological information. If result is POSITIVE SARS-CoV-2 target nucleic acids are DETECTED. The SARS-CoV-2 RNA is generally detectable in upper and lower  respiratory specimens dur ing the acute phase of  infection.  Positive  results are indicative of active infection with SARS-CoV-2.  Clinical  correlation with patient history and other diagnostic information is  necessary to determine patient infection status.  Positive results do  not rule out bacterial infection or co-infection with other viruses. If result is PRESUMPTIVE POSTIVE SARS-CoV-2 nucleic acids MAY BE PRESENT.   A presumptive positive result was obtained on the submitted specimen  and confirmed on repeat testing.  While 2019 novel coronavirus  (SARS-CoV-2) nucleic acids may be present in the submitted sample  additional confirmatory testing may be necessary for epidemiological  and / or clinical management purposes  to differentiate between  SARS-CoV-2 and other Sarbecovirus currently known to infect humans.  If clinically indicated additional testing with an alternate test  methodology [phone]) is advised. The SARS-CoV-2 RNA is generally  detectable in upper and lower respiratory sp ecimens during the acute  phase of infection. The expected result is Negative. Fact Sheet for Patients:  StrictlyIdeas.no Fact Sheet for Healthcare Providers: BankingDealers.co.za This test is not yet approved or cleared by the [place] FDA and has been authorized for detection and/or diagnosis of SARS-CoV-2 by FDA under an Emergency Use Authorization (EUA).  This EUA will remain in effect (meaning this test can be used) for the duration of the COVID-19 declaration under Section 564(b)(1) of the Act, 21 U.S.C. section 360bbb-3(b)(1), unless the authorization is terminated or revoked sooner. Performed at [facility], [address] [place], [place] [address]   Blood culture (routine x 2)     Status: None   Collection Time: 12/28/18  5:40 PM   Specimen: BLOOD  Result Value Ref Range Status   Specimen Description BLOOD BLOOD RIGHT FOREARM  Final   Special Requests   Final     BOTTLES DRAWN AEROBIC AND ANAEROBIC Blood Culture results may not be optimal due to an excessive volume of blood received in culture bottles   Culture   Final    NO GROWTH 5 DAYS Performed at [facility], [address] [place], [place] [address]    Report Status 01/02/2019 FINAL  Final  MRSA PCR Screening     Status: None   Collection Time: 12/28/18 10:27 PM   Specimen: Nasal Mucosa; Nasopharyngeal  Result Value Ref Range Status   MRSA by PCR NEGATIVE NEGATIVE Final    Comment:        The GeneXpert MRSA Assay (FDA approved for NASAL specimens only), is one component of a comprehensive MRSA colonization surveillance program. It is not intended to diagnose MRSA infection nor to guide or monitor treatment for MRSA infections. Performed at [facility], [address] [place], [place] [address]   Blood culture (routine x 2)     Status: None   Collection Time: 12/28/18 11:15 PM   Specimen: BLOOD  Result Value Ref Range Status   Specimen Description BLOOD RIGHT FA  Final   Special Requests   Final    BOTTLES DRAWN AEROBIC AND ANAEROBIC Blood Culture adequate volume   Culture   Final    NO GROWTH 5 DAYS Performed at [facility], [address] [place], [place] [address]    Report Status 01/02/2019 FINAL  Final    Coagulation Studies: No results for input(s): LABPROT, INR in the last 72 hours.  Urinalysis: No results for input(s): COLORURINE, LABSPEC, PHURINE, GLUCOSEU, HGBUR, BILIRUBINUR, KETONESUR, PROTEINUR, UROBILINOGEN, NITRITE, LEUKOCYTESUR in the last 72 hours.  Invalid input(s): APPERANCEUR    Imaging: No results found.   Medications:   . sodium chloride Stopped (01/01/19 1850)  . sodium chloride Stopped (12/30/18 1501)  . ampicillin-sulbactam (UNASYN) IV Stopped (01/01/19 1840)   . allopurinol  100 mg Oral BH-q7a  . aspirin EC  81 mg Oral Daily  . atorvastatin  80 mg Oral Daily  . carvedilol  25 mg Oral BID WC  .  Chlorhexidine Gluconate Cloth  6 each Topical Q0600  . epoetin (EPOGEN/PROCRIT) injection  10,000 Units Intravenous Q T,Th,Sa-HD  . ferric citrate  420 mg Oral BID WC  . heparin  5,000 Units Subcutaneous Q8H  . hydrALAZINE  100 mg Oral TID  . insulin aspart  0-20 Units Subcutaneous TID WC  . insulin aspart  0-5 Units Subcutaneous QHS  . insulin aspart  25 Units Subcutaneous TID WC  . insulin detemir  40 Units Subcutaneous BID  . loratadine  10 mg Oral QHS  . mometasone-formoterol  2 puff Inhalation BID  . montelukast  10 mg Oral QHS  . pantoprazole  40 mg Oral Daily  . pregabalin  75 mg Oral BID  . senna-docusate  1 tablet Oral QHS  . sodium chloride flush  3 mL Intravenous Once  . sodium chloride flush  3 mL Intravenous Q12H  . torsemide  40 mg Oral BID  . valACYclovir  500 mg Oral QODAY  . [START ON 01/10/2019] Vitamin D (Ergocalciferol)  50,000 Units Oral Q30 days   sodium chloride, acetaminophen **OR** acetaminophen, alum & mag hydroxide-simeth, docusate sodium, HYDROcodone-acetaminophen, hydrOXYzine, morphine injection, ondansetron **OR** ondansetron (ZOFRAN) IV, polyethylene glycol, sodium chloride flush  Assessment/ Plan:  [name] is a 63 y.o. black female with end stage renal disease on hemodialysis, diabetes mellitus type II insulin dependent, hypertension, coronary artery disease, chronic hepatitis C, peptic ulcer, obstructive sleep apnea who presents to [facility] on 12/28/2018 for AVF would infection  TTS [facility]) [facility] RIJ permcath  1. End Stage Renal Disease: hemodialysis treatment yesterday.  Complication of dialysis access: infection open wound of AVF. Status post wound dehisence -Wound management as per surgery team -Per report, patient was getting 1750 mg of vancomycin starting June 18 plan till July 16. -Outpatient antibiotic regimen as per ID recommendations  2.   Anemia of chronic kidney disease: Lab Results  Component  Value Date   HGB 9.0 (L) 01/02/2019    - EPO with HD treatment  4. Secondary Hyperparathyroidism: Not currently on any binders. . Lab Results  Component Value Date   CALCIUM 8.3 (L) 01/02/2019   PHOS 2.8 01/01/2019      LOS: 5 [name] 6/22/20203:16 PM

## 2019-01-02 NOTE — Consult Note (Signed)
[facility] Nurse wound consult note Reason for Consult:Left medial upper arm.  S/P debridement HD access site dehiscence.  Wound type:Surgical dehiscence Pressure Injury POA: NA Measurement: 23 cm x 18 cm x 8 cm in center of wound.  Wound bed:  50 % pale pink nongranulating 25% Adipose 25% yellow fibrin Drainage (amount, consistency, odor) minimal serosanguinous  No odor Periwound:intact Dressing procedure/placement/frequency:2 pieces green foam to fit irregularly shaped wound.  Covered with drape.  Seal immediately achieved at 125 mmHg. Change M/W/F with HH.  [employer] device is plugged in to wall and charging.  Will not follow at this time.  Please re-consult if needed. Planned discharge Domenic Moras MSN, RN, FNP-BC CWON Wound, Ostomy, Continence Nurse Pager [phone]

## 2019-01-02 NOTE — Progress Notes (Signed)
Established hemodialysis patient known at [facility] 5:50. Patient will resume same chair time at discharge.  Per Clinic, patient started on 1750mg  Vancomycin 6/18, to end on 7/16.  Elvera Bicker Dialysis Coordinator [phone]

## 2019-01-02 NOTE — Care Management Important Message (Signed)
Important Message  Patient Details  Name: [name] MRN: [id number] Date of Birth: [birth year]   Medicare Important Message Given:  Yes     Dannette Barbara 01/02/2019, 11:30 AM

## 2019-01-02 NOTE — Discharge Instructions (Signed)
It was so nice to meet you during this hospitalization!  You came into the hospital with issues  I have prescribed the following medications: 1. Flagyl 500mg - this is an antibiotic. Please take 1 tablet three times a day for the next two weeks. You will also receive IV antibiotics at dialysis for 2 weeks.  Take care, Dr. Brett Albino

## 2019-01-02 NOTE — Progress Notes (Signed)
Antibiotics called in to Nurse Amy at [facility]  Vancomycin 1,750mg  IV and Ceftazidime 1g IV for 2 weeks.

## 2019-01-02 NOTE — Progress Notes (Signed)
[facility] Daily Progress Note  Subjective: 3 Days Post-Op: Excisional debridement left upper arm wound and placement of a VAC wound dressing  Patient without complaint this AM. No issues overnight. Walked to elevators and back to bed independently. Improvement in pain to left upper extremity.   Objective: Vitals:   01/01/19 1859 01/01/19 1958 01/02/19 0553 01/02/19 0837  BP: (!) 151/88 (!) 141/86 (!) 127/58 132/70  Pulse: 76 75 74 74  Resp:  18 16 18   Temp:  99 F (37.2 C) 98.9 F (37.2 C) 97.8 F (36.6 C)  TempSrc:  Oral Oral Oral  SpO2:  97% 99% 96%  Weight:      Height:        Intake/Output Summary (Last 24 hours) at 01/02/2019 1105 Last data filed at 01/02/2019 0900 Gross per 24 hour  Intake 1184.67 ml  Output 20 ml  Net 1164.67 ml   Physical Exam: A&Ox3, NAD CV: RRR Pulmonary: CTA Bilaterally Abdomen: Soft, Nontender, Nondistended Vascular:  Left upper extremity: OR VAC removed. Wound with approximately 70% granulation tissue. Minimal drainage. No foul order. Surrounding skin healthy.     Document Information  Photos    01/02/2019 10:49  Attached To:  Hospital Encounter on 12/28/18  Source Information  Leonides Schanz  [facility]    Laboratory: CBC    Component Value Date/Time   WBC 8.8 01/02/2019 0638   HGB 9.0 (L) 01/02/2019 0638   HGB 8.9 (L) 01/10/2018 1124   HCT 29.0 (L) 01/02/2019 0638   HCT 28.2 (L) 01/10/2018 1124   PLT 201 01/02/2019 0638   PLT 285 01/10/2018 1124   BMET    Component Value Date/Time   NA 135 01/02/2019 0638   NA 142 01/27/2018 1353   K 4.5 01/02/2019 0638   CL 96 (L) 01/02/2019 0638   CO2 28 01/02/2019 0638   GLUCOSE 226 (H) 01/02/2019 0638   BUN 63 (H) 01/02/2019 0638   BUN 82 (HH) 01/27/2018 1353   CREATININE 6.23 (H) 01/02/2019 0638   CREATININE 1.36 (H) 09/23/2016 1451   CALCIUM 8.3 (L) 01/02/2019 0638   GFRNONAA 7 (L) 01/02/2019 0638   GFRNONAA 43 (L)  09/23/2016 1451   GFRAA 8 (L) 01/02/2019 6808   GFRAA 49 (L) 09/23/2016 1451   Assessment/Planning: The patient is a 63 year old female with multiple medical issues including end-stage renal disease status post a wound debridement with VAC placement postop day 3 1) Patient's pain in the left upper extremity has improved. 2) OR VAC has been removed.  Please refer to picture taken today in regard to status of the patient's wound. VAC changes M-W-F. 3) Seems to be improvement noted to granulation tissue in the wound, animal drainage, no foul odor.  Discussed with Dr. Eber Hong Coron Rossano PA-C 01/02/2019 11:05 AM

## 2019-01-02 NOTE — Discharge Summary (Addendum)
[facility] NAME: [name]    MR#:  [id number]  DATE OF BIRTH:  09-09-1955  DATE OF ADMISSION:  12/28/2018   ADMITTING PHYSICIAN: Heather Mormon, MD  DATE OF DISCHARGE: 01/02/19  PRIMARY CARE PHYSICIAN: Heather Daring, PA-C   ADMISSION DIAGNOSIS:  Hyperglycemia [R73.9] ESRD (end stage renal disease) ([place]) [N18.6] Wound infection [T14.8XXA, L08.9] DISCHARGE DIAGNOSIS:  Active Problems:   Non-healing wound of upper extremity  SECONDARY DIAGNOSIS:   Past Medical History:  Diagnosis Date  . Anemia   . Aortic stenosis    Echo 8/18: mean 13, peak 28, LVOT/AV mean velocity 0.51  . Arthritis   . Asthma    As a child   . Bronchitis   . CAD (coronary artery disease)    a. 09/2016: 50% Ost 1st Mrg stenosis, 50% 2nd Mrg stenosis, 20% Mid-Cx, 95% Prox LAD, 40% mid-LAD, and 10% dist-LAD stenosis. Staged PCI with DES to Prox-LAD.   [name] Chronic combined systolic and diastolic CHF (congestive heart failure) ([facility]) 2011   echo 2/18: EF 55-60, normal wall motion, grade 2 diastolic dysfunction, trivial AI // echo 3/18: Septal and apical HK, EF 45-50, normal wall motion, trivial AI, mild LAE, PASP 38 // echo 8/18: EF 60-65, normal wall motion, grade 1 diastolic dysfunction, calcified aortic valve leaflets, mild aortic stenosis (mean 13, peak 28, LVOT/AV mean velocity 0.51), mild AI, moderate MAC, mild LAE, trivial TR   . Chronic kidney disease    STAGE 4  . Chronic kidney disease on chronic dialysis (HCC)    t, th, sat  . Complication of anesthesia   . Depression   . Diabetes mellitus Dx 1989  . Elevated lipids   . GERD (gastroesophageal reflux disease)   . Gout   . Heart murmur    asymptomatic  . Hepatitis C Dx 2013  . Hypertension Dx 1989  . Infected surgical wound    Lt arm  . Myocardial infarction ([place]) 07/2015  . Obesity   . Pancreatitis 2013  . Pneumonia   . Refusal of blood transfusions as patient is Jehovah's Witness    . Tendinitis   . Tremors of nervous system    LEFT HAND  . Ulcer 2010   HOSPITAL COURSE:   [name] is a 63 year old female who presented to the ED with left upper arm nonhealing wound.  She initially underwent left AV fistula placement on 08/19/2018.  On 11/18/2018, she underwent left basilic vein transposition.  She was seen in the vascular surgery clinic on 11/25/2018 and was noted to have pain, bruising, and edema of the left upper extremity.  There was some concern for possible infection and so she was treated with doxycycline 100 mg twice daily x1 week.  On 12/16/2018, she followed up with [name] in the vascular surgery clinic and was noted to have severe swelling and pain of the left AV fistula site.  On 12/21/2018 she underwent excisional debridement and removal of necrotic tissue.  A deep wound culture was obtained from this area (which ultimately grew Enterococcus faecalis, Haemophilus parainfluenza, and Prevotella) and a wound VAC was placed.  Per nephrology, patient has been receiving IV vancomycin with dialysis treatments for the last 2 months. She returned to the ED with continued wound issues. She was admitted for further management.  Nonhealing left upper extremity wound with infection -s/p repeat debridement of left upper extremity wound with wound VAC placement 6/19 -Seen by vascular surgery-  recommended wound vac changes on MWF -ID consulted- recommended IV vancomycin 1,783m and IV ceftazidime 1g three times a week with HD and flagyl 5046mtid for 2 weeks -Patient needs to follow-up with vascular surgery as an outpatient and will need to send pictures of wound to Dr. RaDelaine Lameor further antibiotic recommendations as an outpatient -Blood cultures with no growth to date [facility]  ESRD on HD TTS- right chest wall PermCath in place -Received HD per usual schedule while hospitalized  Type 2 diabetes- blood sugars have been elevated in the  setting of infection -Home diabetes medicines were continued on discharge  Essential hypertension- BPs well-controlled -Continued carvedilol and hydralazine.  History of combined systolic diastolic CHF- appears euvolemic. -Continude carvedilol and torsemide  Neuropathy- stable -Continued Lyrica.  GERD- stable -Continued Protonix.  Hyperlipidemia- stable -Continued atorvastatin.  History of gout-no signs of acute flare.   -Continued allopurinol.  DISCHARGE CONDITIONS:  Nonhealing LUE wound ESRD on HD Type 2 diabetes Hypertension History of combined systolic and diastolic CHF Neuropathy GERD Hyperlipidemia History of gout CONSULTS OBTAINED:  Treatment Team:  [name], Heather LoryMD Heather DanaMD DRUG ALLERGIES:   Allergies  Allergen Reactions  . Shellfish Allergy Anaphylaxis and Swelling  . Diazepam Other (See Comments)    "felt like out of body experience"   DISCHARGE MEDICATIONS:   Allergies as of 01/02/2019      Reactions   Shellfish Allergy Anaphylaxis, Swelling   Diazepam Other (See Comments)   "felt like out of body experience"      Medication List    TAKE these medications   Accu-Chek FastClix Lancets Misc USE AS DIRECTED TO CHECK BLOOD SUGAR THREE TIMES DAILY AND AT BEDTIME   albuterol 108 (90 Base) MCG/ACT inhaler Commonly known as: ProAir HFA INHALE 2 PUFFS BY MOUTH EVERY 4 HOURS AS NEEDED FOR SHORTNESS OF BREATH What changed:   how much to take  how to take this  when to take this  reasons to take this   allopurinol 100 MG tablet Commonly known as: ZYLOPRIM Take 1 tablet (100 mg total) by mouth every morning.   aspirin 81 MG EC tablet Take 1 tablet (81 mg total) by mouth daily.   atorvastatin 80 MG tablet Commonly known as: LIPITOR TAKE 1 TABLET BY MOUTH ONCE DAILY AT 6PM What changed: See the new instructions.   Auryxia 1 GM 210 MG(Fe) tablet Generic drug: ferric citrate Take 420 mg by mouth 2 (two) times daily  with a meal.   carvedilol 25 MG tablet Commonly known as: COREG TAKE (1) TABLET BY MOUTH TWICE A DAY WITH MEALS (BREAKFAST AND SUPPER) What changed: See the new instructions.   docusate sodium 100 MG capsule Commonly known as: COLACE Take 1 capsule (100 mg total) by mouth 2 (two) times daily as needed for mild constipation.   fluticasone 50 MCG/ACT nasal spray Commonly known as: FLONASE Place 2 sprays into both nostrils daily as needed for allergies or rhinitis.   hydrALAZINE 100 MG tablet Commonly known as: APRESOLINE TAKE (1) TABLET BY MOUTH THREE TIMES DAILY What changed: See the new instructions.   hydrOXYzine 25 MG tablet Commonly known as: ATARAX/VISTARIL Take 1 tablet (25 mg total) by mouth every 6 (six) hours as needed for itching.   insulin glargine 100 unit/mL Sopn Commonly known as: LANTUS Inject 0.6 mLs (60 Units total) into the skin daily for 30 days.   liraglutide 18 MG/3ML Sopn Commonly known as: Victoza  Start with 0.8m daily and increase by 0.684mper week until max dose of 1.8 mg dose achieved. What changed:   how much to take  how to take this  when to take this   loratadine 10 MG tablet Commonly known as: CLARITIN TAKE 1 TABLET BY MOUTH ONCE DAILY. What changed: when to take this   metroNIDAZOLE 500 MG tablet Commonly known as: Flagyl Take 1 tablet (500 mg total) by mouth 3 (three) times daily.   mometasone 0.1 % cream Commonly known as: ELOCON APPLY AS DIRECTED TO AFFECTED AREA ONCE DAILY.   mometasone-formoterol 200-5 MCG/ACT Aero Commonly known as: DULERA Inhale 2 puffs into the lungs 2 (two) times daily. What changed:   when to take this  reasons to take this   montelukast 10 MG tablet Commonly known as: SINGULAIR TAKE ONE TABLET BY MOUTH AT BEDTIME.   nitroGLYCERIN 0.4 MG SL tablet Commonly known as: NITROSTAT Place 1 tablet (0.4 mg total) under the tongue every 5 (five) minutes x 3 doses as needed for chest pain.   NovoLOG  FlexPen 100 UNIT/ML FlexPen Generic drug: insulin aspart INJECT 25 UNITS S.Q. THREE TIMES A DAY WITH MEALS. What changed: See the new instructions.   omeprazole 20 MG capsule Commonly known as: PRILOSEC TAKE (1) CAPSULE BY MOUTH ONCE DAILY. What changed: See the new instructions.   oxyCODONE 5 MG immediate release tablet Commonly known as: Roxicodone Take 1-2 tablets (5-10 mg total) by mouth every 6 (six) hours as needed for moderate pain or severe pain.   pregabalin 75 MG capsule Commonly known as: LYRICA Take 1 capsule (75 mg total) by mouth 2 (two) times daily.   Senna Plus 8.6-50 MG tablet Generic drug: senna-docusate TAKE ONE TABLET BY MOUTH AT BEDTIME.   silver sulfADIAZINE 1 % cream Commonly known as: SILVADENE Apply 1 application topically daily. What changed:   when to take this  reasons to take this   torsemide 20 MG tablet Commonly known as: DEMADEX Take 2 tablets (40 mg total) by mouth 2 (two) times daily. What changed: See the new instructions.   traMADol 50 MG tablet Commonly known as: ULTRAM One to Two Tabs Every Six Hours as Needed For Pain What changed:   how much to take  how to take this  when to take this  reasons to take this   triamcinolone cream 0.1 % Commonly known as: KENALOG APPLY TO AFFECTED AREA TWICE DAILY What changed: See the new instructions.   valACYclovir 500 MG tablet Commonly known as: VALTREX TAKE (1) TABLET BY MOUTH EVERY OTHER DAY. What changed: See the new instructions.   Vitamin D (Ergocalciferol) 1.25 MG (50000 UT) Caps capsule Commonly known as: DRISDOL TAKE 1 CAPSULE BY MOUTH ONCE A MONTH What changed: See the new instructions.        DISCHARGE INSTRUCTIONS:  1.  Follow-up with PCP in 5 days 2.  Follow-up with vascular surgery in 1 to 2 weeks 3.  Take Flagyl 500 mg 3 times a day for the next 2 weeks 4.  IV vancomycin and ceftazidime during dialysis for 2 weeks DIET:  Cardiac diet, Diabetic diet and  Renal diet DISCHARGE CONDITION:  Stable ACTIVITY:  Activity as tolerated OXYGEN:  Home Oxygen: No.  Oxygen Delivery: room air DISCHARGE LOCATION:  home   If you experience worsening of your admission symptoms, develop shortness of breath, life threatening emergency, suicidal or homicidal thoughts you must seek medical attention immediately by calling 911 or calling your  MD immediately  if symptoms less severe.  You Must read complete instructions/literature along with all the possible adverse reactions/side effects for all the Medicines you take and that have been prescribed to you. Take any new Medicines after you have completely understood and accpet all the possible adverse reactions/side effects.   Please note  You were cared for by a hospitalist during your hospital stay. If you have any questions about your discharge medications or the care you received while you were in the hospital after you are discharged, you can call the unit and asked to speak with the hospitalist on call if the hospitalist that took care of you is not available. Once you are discharged, your primary care physician will handle any further medical issues. Please note that NO REFILLS for any discharge medications will be authorized once you are discharged, as it is imperative that you return to your primary care physician (or establish a relationship with a primary care physician if you do not have one) for your aftercare needs so that they can reassess your need for medications and monitor your lab values.    On the day of Discharge:  VITAL SIGNS:  Blood pressure (!) 119/54, pulse 73, temperature 98.6 F (37 C), temperature source Oral, resp. rate 18, height _0  (1.727 m), weight 133.4 kg, SpO2 94 %. PHYSICAL EXAMINATION:  GENERAL:  63 y.o.-year-old patient lying in the bed with no acute distress.  EYES: Pupils equal, round, reactive to light and accommodation. No scleral icterus. Extraocular muscles intact.   HEENT: Head atraumatic, normocephalic. Oropharynx and nasopharynx clear.  NECK:  Supple, no jugular venous distention. No thyroid enlargement, no tenderness.  LUNGS: Normal breath sounds bilaterally, no wheezing, rales,rhonchi or crepitation. No use of accessory muscles of respiration.  CARDIOVASCULAR: S1, S2 normal. No murmurs, rubs, or gallops.  ABDOMEN: Soft, non-tender, non-distended. Bowel sounds present. No organomegaly or mass.  EXTREMITIES: No pedal edema, cyanosis, or clubbing. +dry dressing in place over LUE NEUROLOGIC: Cranial nerves II through XII are intact. Muscle strength 5/5 in all extremities. Sensation intact. Gait not checked.  PSYCHIATRIC: The patient is alert and oriented x 3.  SKIN: No obvious rash, lesion, or ulcer.  DATA REVIEW:   CBC Recent Labs  Lab 01/02/19 0638  WBC 8.8  HGB 9.0*  HCT 29.0*  PLT 201    Chemistries  Recent Labs  Lab 12/28/18 1345  12/30/18 0230  01/02/19 0638  NA 131*   < > 129*   < > 135  K 4.4   < > 3.7   < > 4.5  CL 91*   < > 91*   < > 96*  CO2 26   < > 26   < > 28  GLUCOSE 565*   < > 394*   < > 226*  BUN 44*   < > 39*   < > 63*  CREATININE 5.74*   < > 5.01*   < > 6.23*  CALCIUM 7.9*   < > 7.5*   < > 8.3*  MG  --   --  1.8  --   --   AST 24  --   --   --   --   ALT 23  --   --   --   --   ALKPHOS 287*  --   --   --   --   BILITOT 0.4  --   --   --   --    < > =  values in this interval not displayed.     Microbiology Results  Results for orders placed or performed during the hospital encounter of 12/28/18  SARS Coronavirus 2 (CEPHEID - Performed in [facility] hospital lab), Hosp Order     Status: None   Collection Time: 12/28/18  5:22 PM   Specimen: Nasopharyngeal Swab  Result Value Ref Range Status   SARS Coronavirus 2 NEGATIVE NEGATIVE Final    Comment: (NOTE) If result is NEGATIVE SARS-CoV-2 target nucleic acids are NOT DETECTED. The SARS-CoV-2 RNA is generally detectable in upper and lower  respiratory  specimens during the acute phase of infection. The lowest  concentration of SARS-CoV-2 viral copies this assay can detect is 250  copies / mL. A negative result does not preclude SARS-CoV-2 infection  and should not be used as the sole basis for treatment or other  patient management decisions.  A negative result may occur with  improper specimen collection / handling, submission of specimen other  than nasopharyngeal swab, presence of viral mutation(s) within the  areas targeted by this assay, and inadequate number of viral copies  (<250 copies / mL). A negative result must be combined with clinical  observations, patient history, and epidemiological information. If result is POSITIVE SARS-CoV-2 target nucleic acids are DETECTED. The SARS-CoV-2 RNA is generally detectable in upper and lower  respiratory specimens dur ing the acute phase of infection.  Positive  results are indicative of active infection with SARS-CoV-2.  Clinical  correlation with patient history and other diagnostic information is  necessary to determine patient infection status.  Positive results do  not rule out bacterial infection or co-infection with other viruses. If result is PRESUMPTIVE POSTIVE SARS-CoV-2 nucleic acids MAY BE PRESENT.   A presumptive positive result was obtained on the submitted specimen  and confirmed on repeat testing.  While 2019 novel coronavirus  (SARS-CoV-2) nucleic acids may be present in the submitted sample  additional confirmatory testing may be necessary for epidemiological  and / or clinical management purposes  to differentiate between  SARS-CoV-2 and other Sarbecovirus currently known to infect humans.  If clinically indicated additional testing with an alternate test  methodology [phone]) is advised. The SARS-CoV-2 RNA is generally  detectable in upper and lower respiratory sp ecimens during the acute  phase of infection. The expected result is Negative. Fact Sheet for  Patients:  StrictlyIdeas.no Fact Sheet for Healthcare Providers: BankingDealers.co.za This test is not yet approved or cleared by the [place] FDA and has been authorized for detection and/or diagnosis of SARS-CoV-2 by FDA under an Emergency Use Authorization (EUA).  This EUA will remain in effect (meaning this test can be used) for the duration of the COVID-19 declaration under Section 564(b)(1) of the Act, 21 U.S.C. section 360bbb-3(b)(1), unless the authorization is terminated or revoked sooner. Performed at [facility], [address] [place], [place] [address]   Blood culture (routine x 2)     Status: None   Collection Time: 12/28/18  5:40 PM   Specimen: BLOOD  Result Value Ref Range Status   Specimen Description BLOOD BLOOD RIGHT FOREARM  Final   Special Requests   Final    BOTTLES DRAWN AEROBIC AND ANAEROBIC Blood Culture results may not be optimal due to an excessive volume of blood received in culture bottles   Culture   Final    NO GROWTH 5 DAYS Performed at [facility], [address], [place], [place] [address]    Report Status  01/02/2019 FINAL  Final  MRSA PCR Screening     Status: None   Collection Time: 12/28/18 10:27 PM   Specimen: Nasal Mucosa; Nasopharyngeal  Result Value Ref Range Status   MRSA by PCR NEGATIVE NEGATIVE Final    Comment:        The GeneXpert MRSA Assay (FDA approved for NASAL specimens only), is one component of a comprehensive MRSA colonization surveillance program. It is not intended to diagnose MRSA infection nor to guide or monitor treatment for MRSA infections. Performed at [facility], [address] [place], [place] [address]   Blood culture (routine x 2)     Status: None   Collection Time: 12/28/18 11:15 PM   Specimen: BLOOD  Result Value Ref Range Status   Specimen Description BLOOD RIGHT FA  Final   Special Requests   Final    BOTTLES DRAWN  AEROBIC AND ANAEROBIC Blood Culture adequate volume   Culture   Final    NO GROWTH 5 DAYS Performed at [facility], [address] [place], [place] [address]    Report Status 01/02/2019 FINAL  Final    RADIOLOGY:  No results found.   Management plans discussed with the patient, family and they are in agreement.  CODE STATUS: Full Code   TOTAL TIME TAKING CARE OF THIS PATIENT: 50 minutes.    Berna Spare Brentney Goldbach M.D on 01/02/2019 at 3:32 PM  Between 7am to 6pm - Pager [phone]  After 6pm go to [email or web address] - password [email or web address] [facility]  [facility]  Office  [phone]  CC: Primary care physician; Heather Daring, PA-C   Note: This dictation was prepared with Dragon dictation along with smaller phrase technology. Any transcriptional errors that result from this process are unintentional.

## 2019-01-02 NOTE — Consult Note (Signed)
NAME: [name]  DOB: 19-Dec-1955  MRN: [id number]  Date/Time: 01/02/2019 10:34 AM  REQUESTING PROVIDER: Mayo Subjective:  REASON FOR CONSULT: Enterococcus wound infection. ? [name] is a 63 y.o. female with a history of end-stage renal disease, diabetes mellitus, hypertension, coronary artery disease, chronic hepatitis C, OSA AV fistula site infection was admitted on 12/28/2018 with painful swelling of the left arm at the site of the previous surgery of the AV fistula.  Patient on 08/19/2018 underwent left brachial basilic atrial venous fistula placement as the first stage of a basilic transposition by Dr. Franchot Gallo.  Patient continue to use the tunneled catheter for dialysis and on 11/18/2018 patient underwent left basilic vein transposition to create the brachiobasilic AV fistula.  She was seen as follow-up at the vascular clinic on 11/25/2018 and at that visit the patient has complained about the arm feeling cold and bruised and extremely painful.  She underwent an hemodialysis duplex study and that revealed no evidence of steal syndrome.  But the arm was extremely edematous.  The left brachiobasilic trans-AV fistula appeared patent.  Arterial anastomosis showed moderate increased velocities.  There was antegrade flow proximally and distally in the radial artery.  After assessment by vascular nurse practitioner it was deemed that this was a normal postprocedure pain discomfort and swelling.  Except at the one site near the incision there was erythema which was concerning for possible infection and hence they gave her a short course of antibiotics which was doxycycline 100 mg twice a day for a week.  She then had a follow-up on 12/16/2018 by Dr. Lucky Cowboy and during that visit it was noted that the left arm wound had separated and she had severe swelling and pain in the left fistula site and according to his note she was also on antibiotics from the dialysis center.  His assessment was that she needed the wound  debridement and placement of a wound VAC.  On 12/21/2018 she underwent excisional debridement of the seroma with  removal of the necrotic tissue.  Dr. Collene Leyden operative note states after removal of the necrotic skin and subcutaneous soft tissue the midportion of the wound had a large seroma.  This extended all the way down to the muscle.  Fibrinous exudate was debrided from the muscle and surrounding tissue.  Deep wound culture were obtained from this area.  A wound VAC was then placed with a wedge extending down to the bottom of the seroma cavity and then a second strip along the length of the incision.  The wound itself measures 23 cm in length 6 cm in width and 8 cm in depth.  Culture from the wound showed enterococcus faecalis, Haemophilus parainfluenza and Prevotella.  Patient was on antibiotic given at the dialysis center but I am not aware of what it was. On admission patient had temperature of 98.3, BP of 135/63, respiratory rate of 16, heart rate of 82, pulse ox of 93%.  Blood cultures were sent.  WBC was 8.4, hemoglobin of 9.9 blood glucose of 565 alkaline phosphatase of 287 and platelet of 189.  She was started on IV Unasyn.  On 12/30/2018 she underwent debridement of the necrotic tissue and placement of VAC.  I am asked to see the patient for management of the wound. Past Medical History:  Diagnosis Date   Anemia    Aortic stenosis    Echo 8/18: mean 13, peak 28, LVOT/AV mean velocity 0.51   Arthritis    Asthma  As a child    Bronchitis    CAD (coronary artery disease)    a. 09/2016: 50% Ost 1st Mrg stenosis, 50% 2nd Mrg stenosis, 20% Mid-Cx, 95% Prox LAD, 40% mid-LAD, and 10% dist-LAD stenosis. Staged PCI with DES to Prox-LAD.    Chronic combined systolic and diastolic CHF (congestive heart failure) ([place]) 2011   echo 2/18: EF 55-60, normal wall motion, grade 2 diastolic dysfunction, trivial AI // echo 3/18: Septal and apical HK, EF 45-50, normal wall motion, trivial AI, mild LAE,  PASP 38 // echo 8/18: EF 60-65, normal wall motion, grade 1 diastolic dysfunction, calcified aortic valve leaflets, mild aortic stenosis (mean 13, peak 28, LVOT/AV mean velocity 0.51), mild AI, moderate MAC, mild LAE, trivial TR    Chronic kidney disease    STAGE 4   Chronic kidney disease on chronic dialysis (HCC)    t, th, sat   Complication of anesthesia    Depression    Diabetes mellitus Dx 1989   Elevated lipids    GERD (gastroesophageal reflux disease)    Gout    Heart murmur    asymptomatic   Hepatitis C Dx 2013   Hypertension Dx 1989   Infected surgical wound    Lt arm   Myocardial infarction ([place]) 07/2015   Obesity    Pancreatitis 2013   Pneumonia    Refusal of blood transfusions as patient is Jehovah's Witness    Tendinitis    Tremors of nervous system    LEFT HAND   Ulcer 2010    Past Surgical History:  Procedure Laterality Date   APPLICATION OF WOUND VAC Left 08/14/2017   Procedure: APPLICATION OF WOUND VAC Exchange;  Surgeon: Robert Bellow, MD;  Location: [facility];  Service: General;  Laterality: Left;   APPLICATION OF WOUND VAC Left 12/21/2018   Procedure: APPLICATION OF WOUND VAC;  Surgeon: Katha Cabal, MD;  Location: [facility];  Service: Vascular;  Laterality: Left;   AV FISTULA PLACEMENT Left 08/19/2018   Procedure: ARTERIOVENOUS (AV) FISTULA CREATION ( BRACHIOBASILIC );  Surgeon: Katha Cabal, MD;  Location: [facility];  Service: Vascular;  Laterality: Left;   BASCILIC VEIN TRANSPOSITION Left 11/18/2018   Procedure: BASCILIC VEIN TRANSPOSITION;  Surgeon: Katha Cabal, MD;  Location: [facility];  Service: Vascular;  Laterality: Left;   CHOLECYSTECTOMY     COLONOSCOPY WITH PROPOFOL N/A 02/03/2018   Procedure: COLONOSCOPY WITH PROPOFOL;  Surgeon: Lin Landsman, MD;  [facility];  Service: Gastroenterology;  Laterality: N/A;   CORONARY ANGIOPLASTY  07/2015   STENT   CORONARY STENT INTERVENTION N/A  09/18/2016   Procedure: Coronary Stent Intervention;  Surgeon: Troy Sine, MD;  Location: [place] [facility];  Service: Cardiovascular;  Laterality: N/A;   DIALYSIS/PERMA CATHETER INSERTION N/A 05/10/2018   Procedure: DIALYSIS/PERMA CATHETER INSERTION;  Surgeon: Katha Cabal, MD;  Location: [place] [facility];  Service: Cardiovascular;  Laterality: N/A;   DRESSING CHANGE UNDER ANESTHESIA Left 08/15/2017   Procedure: exploration of wound for bleeding;  Surgeon: Robert Bellow, MD;  Location: [facility];  Service: General;  Laterality: Left;   ESOPHAGOGASTRODUODENOSCOPY (EGD) WITH PROPOFOL N/A 02/03/2018   Procedure: ESOPHAGOGASTRODUODENOSCOPY (EGD) WITH PROPOFOL;  Surgeon: Lin Landsman, MD;  Location: [facility] ENDOSCOPY;  Service: Gastroenterology;  Laterality: N/A;   EYE SURGERY  11/17/2018   INCISION AND DRAINAGE ABSCESS Left 08/12/2017   Procedure: INCISION AND DRAINAGE ABSCESS;  Surgeon: Robert Bellow, MD;  Location: [facility];  Service: General;  Laterality: Left;   KNEE ARTHROSCOPY     LEFT HEART CATH N/A 09/18/2016   Procedure: Left Heart Cath;  Surgeon: Troy Sine, MD;  Location: [place] [facility];  Service: Cardiovascular;  Laterality: N/A;   LEFT HEART CATH AND CORONARY ANGIOGRAPHY N/A 09/16/2016   Procedure: Left Heart Cath and Coronary Angiography;  Surgeon: Burnell Blanks, MD;  Location: [place] [facility];  Service: Cardiovascular;  Laterality: N/A;   LEFT HEART CATH AND CORONARY ANGIOGRAPHY N/A 04/29/2017   Procedure: LEFT HEART CATH AND CORONARY ANGIOGRAPHY;  Surgeon: Nelva Bush, MD;  Location: [place] [facility];  Service: Cardiovascular;  Laterality: N/A;   LOWER EXTREMITY ANGIOGRAPHY Right 03/08/2018   Procedure: LOWER EXTREMITY ANGIOGRAPHY;  Surgeon: Katha Cabal, MD;  Location: [place] [facility];  Service: Cardiovascular;  Laterality: Right;   TUBAL LIGATION     TUBAL LIGATION     WOUND DEBRIDEMENT Left 12/21/2018    Procedure: DEBRIDEMENT WOUND;  Surgeon: Katha Cabal, MD;  Location: [facility];  Service: Vascular;  Laterality: Left;   WOUND DEBRIDEMENT Left 12/30/2018   Procedure: DEBRIDEMENT WOUND WITH VAC PLACEMENT (LEFT UPPER EXTREMITY);  Surgeon: Katha Cabal, MD;  Location: [facility];  Service: Vascular;  Laterality: Left;    Social History   Socioeconomic History   Marital status: Divorced    Spouse name: Not on file   Number of children: Not on file   Years of education: Not on file   Highest education level: Not on file  Occupational History   Not on file  Social Needs   Financial resource strain: Not on file   Food insecurity    Worry: Not on file    Inability: Not on file   Transportation needs    Medical: Not on file    Non-medical: Not on file  Tobacco Use   Smoking status: Former Smoker    Packs/day: 0.25    Years: 6.00    Pack years: 1.50    Types: Cigarettes    Quit date: 10/25/1980    Years since quitting: 38.2   Smokeless tobacco: Never Used  Substance and Sexual Activity   Alcohol use: Yes    Comment: rarely   Drug use: Not Currently    Types: "Crack" cocaine, Marijuana    Comment: 08/21/2016 "clean since 05/1998"   Sexual activity: Not on file    Comment: Not asked  Lifestyle   Physical activity    Days per week: Not on file    Minutes per session: Not on file   Stress: Not on file  Relationships   Social connections    Talks on phone: Not on file    Gets together: Not on file    Attends religious service: Not on file    Active member of club or organization: Not on file    Attends meetings of clubs or organizations: Not on file    Relationship status: Not on file   Intimate partner violence    Fear of current or ex partner: Not on file    Emotionally abused: Not on file    Physically abused: Not on file    Forced sexual activity: Not on file  Other Topics Concern   Not on file  Social History Narrative   Not on file      Family History  Problem Relation Age of Onset   Colon cancer Mother    Heart attack Other    Heart  attack Maternal Grandmother    Hypertension Sister    Hypertension Brother    Diabetes Paternal Grandmother    Breast cancer Neg Hx    Allergies  Allergen Reactions   Shellfish Allergy Anaphylaxis and Swelling   Diazepam Other (See Comments)    "felt like out of body experience"    ? Current Facility-Administered Medications  Medication Dose Route Frequency Provider Last Rate Last Dose   0.9 %  sodium chloride infusion  250 mL Intravenous PRN Schnier, Dolores Lory, MD   Stopped at 01/01/19 1850   0.9 %  sodium chloride infusion   Intravenous Once Schnier, Dolores Lory, MD   Stopped at 12/30/18 1501   acetaminophen (TYLENOL) tablet 650 mg  650 mg Oral Q6H PRN Schnier, Dolores Lory, MD       Or   acetaminophen (TYLENOL) suppository 650 mg  650 mg Rectal Q6H PRN Schnier, Dolores Lory, MD       allopurinol (ZYLOPRIM) tablet 100 mg  100 mg Oral Benna Dunks, Dolores Lory, MD   100 mg at 01/02/19 0624   alum & mag hydroxide-simeth (MAALOX/MYLANTA) 200-200-20 MG/5ML suspension 30 mL  30 mL Oral Q4H PRN Henreitta Leber, MD   30 mL at 01/01/19 0019   Ampicillin-Sulbactam (UNASYN) 3 g in sodium chloride 0.9 % 100 mL IVPB  3 g Intravenous Q24H Schnier, Dolores Lory, MD   Stopped at 01/01/19 1840   aspirin EC tablet 81 mg  81 mg Oral Daily Katha Cabal, MD   81 mg at 01/02/19 0835   atorvastatin (LIPITOR) tablet 80 mg  80 mg Oral Daily Katha Cabal, MD   80 mg at 01/01/19 1759   carvedilol (COREG) tablet 25 mg  25 mg Oral BID WC Schnier, Dolores Lory, MD   25 mg at 01/02/19 0175   Chlorhexidine Gluconate Cloth 2 % PADS 6 each  6 each Topical Q0600 Katha Cabal, MD   6 each at 01/01/19 0851   docusate sodium (COLACE) capsule 100 mg  100 mg Oral BID PRN Schnier, Dolores Lory, MD       epoetin alfa (EPOGEN) injection 10,000 Units  10,000 Units Intravenous Q T,Th,Sa-HD Schnier,  Dolores Lory, MD   10,000 Units at 12/31/18 1027   ferric citrate (AURYXIA) tablet 420 mg  420 mg Oral BID WC Schnier, Dolores Lory, MD   420 mg at 01/02/19 0841   heparin injection 5,000 Units  5,000 Units Subcutaneous Q8H Schnier, Dolores Lory, MD   5,000 Units at 01/02/19 1025   hydrALAZINE (APRESOLINE) tablet 100 mg  100 mg Oral TID Katha Cabal, MD   100 mg at 01/02/19 0839   HYDROcodone-acetaminophen (NORCO/VICODIN) 5-325 MG per tablet 1 tablet  1 tablet Oral Q6H PRN Schnier, Dolores Lory, MD   1 tablet at 01/01/19 1757   hydrOXYzine (ATARAX/VISTARIL) tablet 25 mg  25 mg Oral Q6H PRN Katha Cabal, MD   25 mg at 01/02/19 0846   insulin aspart (novoLOG) injection 0-20 Units  0-20 Units Subcutaneous TID WC Mayo, Pete Pelt, MD   4 Units at 01/02/19 0820   insulin aspart (novoLOG) injection 0-5 Units  0-5 Units Subcutaneous QHS Sela Hua, MD   2 Units at 01/01/19 2254   insulin aspart (novoLOG) injection 25 Units  25 Units Subcutaneous TID WC Henreitta Leber, MD   25 Units at 01/02/19 0820   insulin detemir (LEVEMIR) injection 40 Units  40 Units Subcutaneous BID Henreitta Leber, MD  40 Units at 01/02/19 0834   loratadine (CLARITIN) tablet 10 mg  10 mg Oral QHS Schnier, Dolores Lory, MD   10 mg at 01/01/19 2256   mometasone-formoterol (DULERA) 200-5 MCG/ACT inhaler 2 puff  2 puff Inhalation BID Schnier, Dolores Lory, MD   2 puff at 01/02/19 0840   montelukast (SINGULAIR) tablet 10 mg  10 mg Oral QHS Schnier, Dolores Lory, MD   10 mg at 01/01/19 2256   morphine 2 MG/ML injection 1-2 mg  1-2 mg Intravenous Q4H PRN Schnier, Dolores Lory, MD   2 mg at 01/02/19 0624   ondansetron (ZOFRAN) tablet 4 mg  4 mg Oral Q6H PRN Schnier, Dolores Lory, MD       Or   ondansetron [facility]) injection 4 mg  4 mg Intravenous Q6H PRN Schnier, Dolores Lory, MD   4 mg at 12/30/18 1655   pantoprazole (PROTONIX) EC tablet 40 mg  40 mg Oral Daily Schnier, Dolores Lory, MD   40 mg at 01/02/19 0839   polyethylene glycol  (MIRALAX / GLYCOLAX) packet 17 g  17 g Oral Daily PRN Schnier, Dolores Lory, MD       pregabalin (LYRICA) capsule 75 mg  75 mg Oral BID Schnier, Dolores Lory, MD   75 mg at 01/02/19 9147   senna-docusate (Senokot-S) tablet 1 tablet  1 tablet Oral QHS Schnier, Dolores Lory, MD   1 tablet at 01/01/19 2256   sodium chloride flush (NS) 0.9 % injection 3 mL  3 mL Intravenous Once Schnier, Dolores Lory, MD       sodium chloride flush (NS) 0.9 % injection 3 mL  3 mL Intravenous Q12H Schnier, Dolores Lory, MD   3 mL at 01/02/19 0847   sodium chloride flush (NS) 0.9 % injection 3 mL  3 mL Intravenous PRN Schnier, Dolores Lory, MD       torsemide [facility]) tablet 40 mg  40 mg Oral BID Schnier, Dolores Lory, MD   40 mg at 01/02/19 0841   valACYclovir (VALTREX) tablet 500 mg  500 mg Oral QODAY Schnier, Dolores Lory, MD   500 mg at 01/02/19 0840   [START ON 01/10/2019] Vitamin D (Ergocalciferol) (DRISDOL) capsule 50,000 Units  50,000 Units Oral Q30 days Schnier, Dolores Lory, MD         Abtx:  Anti-infectives (From admission, onward)   Start     Dose/Rate Route Frequency Ordered Stop   12/29/18 1800  Ampicillin-Sulbactam (UNASYN) 3 g in sodium chloride 0.9 % 100 mL IVPB     3 g 200 mL/hr over 30 Minutes Intravenous Every 24 hours 12/29/18 0851     12/29/18 1000  valACYclovir (VALTREX) tablet 500 mg     500 mg Oral Every other day 12/28/18 2041     12/28/18 1715  Ampicillin-Sulbactam (UNASYN) 3 g in sodium chloride 0.9 % 100 mL IVPB  Status:  Discontinued     3 g 200 mL/hr over 30 Minutes Intravenous Every 12 hours 12/28/18 1701 12/29/18 0851      REVIEW OF SYSTEMS:  Const: negative fever, negative chills, negative weight loss Eyes: negative diplopia or visual changes, negative eye pain ENT: negative coryza, negative sore throat Resp: negative cough, hemoptysis, dyspnea Cards: negative for chest pain, palpitations, lower extremity edema GU: negative for frequency, dysuria and hematuria GI: Negative for abdominal pain,  diarrhea, bleeding, constipation Skin: negative for rash and pruritus Heme: negative for easy bruising and gum/nose bleeding WG:NFAO left arm, unable to lift the arm freely. Swelling left  arm Neurolo:negative for headaches, dizziness, vertigo, memory problems  Psych: negative for feelings of anxiety, depression  Endocrine: poorly controlled DM Allergy/Immunology- as of above? Objective:  VITALS:  BP 132/70 (BP Location: Right Leg)    Pulse 74    Temp 97.8 F (36.6 C) (Oral)    Resp 18    Ht _0  (1.727 m)    Wt 133.4 kg    SpO2 96%    BMI 44.72 kg/m  PHYSICAL EXAM:  General: sleepy,  cooperative, no distress, appears stated age. Obese Head: Normocephalic, without obvious abnormality, atraumatic. Eyes: Conjunctivae clear, anicteric sclerae. Pupils are equal ENT Nares normal. No drainage or sinus tenderness. Lips, mucosa, and tongue normal. No Thrush Neck: Rt IJ dialysis cath Supple, symmetrical, no adenopathy, thyroid: non tender no carotid bruit and no JVD. Back: No CVA tenderness. Lungs: b/l air entry Heart: s1s2 Abdomen: Soft, lap scar, non-tender,not distended. Bowel sounds normal. No masses Extremities:   12/28/18 on admission    01/02/19-post debridement   she has a wound vac   atraumatic, no cyanosis. No edema. No clubbing Skin: excoriations Lymph: Cervical, supraclavicular normal. Neurologic: Grossly non-focal Pertinent Labs Lab Results CBC    Component Value Date/Time   WBC 8.8 01/02/2019 0638   RBC 3.07 (L) 01/02/2019 0638   HGB 9.0 (L) 01/02/2019 0638   HGB 8.9 (L) 01/10/2018 1124   HCT 29.0 (L) 01/02/2019 0638   HCT 28.2 (L) 01/10/2018 1124   PLT 201 01/02/2019 0638   PLT 285 01/10/2018 1124   MCV 94.5 01/02/2019 0638   MCV 81 01/10/2018 1124   MCH 29.3 01/02/2019 0638   MCHC 31.0 01/02/2019 0638   RDW 16.3 (H) 01/02/2019 0638   RDW 19.6 (H) 01/10/2018 1124   LYMPHSABS 1.2 12/28/2018 1345   LYMPHSABS 1.9 01/10/2018 1124   MONOABS 0.8 12/28/2018  1345   EOSABS 0.2 12/28/2018 1345   EOSABS 0.5 (H) 01/10/2018 1124   BASOSABS 0.0 12/28/2018 1345   BASOSABS 0.0 01/10/2018 1124    CMP Latest Ref Rng & Units 01/02/2019 01/01/2019 12/30/2018  Glucose 70 - 99 mg/dL 226(H) 345(H) 225(H)  BUN 6 - 20 mg/dL 63(H) 42(H) 49(H)  Creatinine 0.44 - 1.00 mg/dL 6.23(H) 4.65(H) 5.23(H)  Sodium 135 - 145 mmol/L 135 132(L) 132(L)  Potassium 3.5 - 5.1 mmol/L 4.5 3.9 3.8  Chloride 98 - 111 mmol/L 96(L) 94(L) 91(L)  CO2 22 - 32 mmol/L _1 Calcium 8.9 - 10.3 mg/dL 8.3(L) 7.6(L) 7.9(L)  Total Protein 6.5 - 8.1 g/dL - - -  Total Bilirubin 0.3 - 1.2 mg/dL - - -  Alkaline Phos 38 - 126 U/L - - -  AST 15 - 41 U/L - - -  ALT 0 - 44 U/L - - -      Microbiology: Recent Results (from the past 240 hour(s))  SARS Coronavirus 2 (CEPHEID - Performed in [place] hospital lab), Hosp Order     Status: None   Collection Time: 12/28/18  5:22 PM   Specimen: Nasopharyngeal Swab  Result Value Ref Range Status   SARS Coronavirus 2 NEGATIVE NEGATIVE Final    Comment: (NOTE) If result is NEGATIVE SARS-CoV-2 target nucleic acids are NOT DETECTED. The SARS-CoV-2 RNA is generally detectable in upper and lower  respiratory specimens during the acute phase of infection. The lowest  concentration of SARS-CoV-2 viral copies this assay can detect is 250  copies / mL. A negative result does not preclude SARS-CoV-2 infection  and should not  be used as the sole basis for treatment or other  patient management decisions.  A negative result may occur with  improper specimen collection / handling, submission of specimen other  than nasopharyngeal swab, presence of viral mutation(s) within the  areas targeted by this assay, and inadequate number of viral copies  (<250 copies / mL). A negative result must be combined with clinical  observations, patient history, and epidemiological information. If result is POSITIVE SARS-CoV-2 target nucleic acids are DETECTED. The  SARS-CoV-2 RNA is generally detectable in upper and lower  respiratory specimens dur ing the acute phase of infection.  Positive  results are indicative of active infection with SARS-CoV-2.  Clinical  correlation with patient history and other diagnostic information is  necessary to determine patient infection status.  Positive results do  not rule out bacterial infection or co-infection with other viruses. If result is PRESUMPTIVE POSTIVE SARS-CoV-2 nucleic acids MAY BE PRESENT.   A presumptive positive result was obtained on the submitted specimen  and confirmed on repeat testing.  While 2019 novel coronavirus  (SARS-CoV-2) nucleic acids may be present in the submitted sample  additional confirmatory testing may be necessary for epidemiological  and / or clinical management purposes  to differentiate between  SARS-CoV-2 and other Sarbecovirus currently known to infect humans.  If clinically indicated additional testing with an alternate test  methodology [phone]) is advised. The SARS-CoV-2 RNA is generally  detectable in upper and lower respiratory sp ecimens during the acute  phase of infection. The expected result is Negative. Fact Sheet for Patients:  StrictlyIdeas.no Fact Sheet for Healthcare Providers: BankingDealers.co.za This test is not yet approved or cleared by the [place] FDA and has been authorized for detection and/or diagnosis of SARS-CoV-2 by FDA under an Emergency Use Authorization (EUA).  This EUA will remain in effect (meaning this test can be used) for the duration of the COVID-19 declaration under Section 564(b)(1) of the Act, 21 U.S.C. section 360bbb-3(b)(1), unless the authorization is terminated or revoked sooner. Performed at [facility], [address] [place], [place] [address]   Blood culture (routine x 2)     Status: None   Collection Time: 12/28/18  5:40 PM   Specimen: BLOOD  Result  Value Ref Range Status   Specimen Description BLOOD BLOOD RIGHT FOREARM  Final   Special Requests   Final    BOTTLES DRAWN AEROBIC AND ANAEROBIC Blood Culture results may not be optimal due to an excessive volume of blood received in culture bottles   Culture   Final    NO GROWTH 5 DAYS Performed at [facility], [address] [place], [place] [address]    Report Status 01/02/2019 FINAL  Final  MRSA PCR Screening     Status: None   Collection Time: 12/28/18 10:27 PM   Specimen: Nasal Mucosa; Nasopharyngeal  Result Value Ref Range Status   MRSA by PCR NEGATIVE NEGATIVE Final    Comment:        The GeneXpert MRSA Assay (FDA approved for NASAL specimens only), is one component of a comprehensive MRSA colonization surveillance program. It is not intended to diagnose MRSA infection nor to guide or monitor treatment for MRSA infections. Performed at [facility], [address] [place],  [address]   Blood culture (routine x 2)     Status: None   Collection Time: 12/28/18 11:15 PM   Specimen: BLOOD  Result Value Ref Range Status   Specimen Description BLOOD RIGHT  FA  Final   Special Requests   Final    BOTTLES DRAWN AEROBIC AND ANAEROBIC Blood Culture adequate volume   Culture   Final    NO GROWTH 5 DAYS Performed at [facility], [address] [place], [place] [address]    Report Status 01/02/2019 FINAL  Final    IMAGING RESULTS:   I have personally reviewed the films ? Impression/Recommendation ?64 y.o. female with a history of end-stage renal disease, diabetes mellitus, hypertension, coronary artery disease, chronic hepatitis C, OSA AV fistula site infection was admitted on 12/28/2018 with painful swelling of the left arm at the site of the previous surgery of the AV fistula.   Complicated deep infection of the soft tissue at the site of the left AV fistula. S/p two debridements- on 12/21/18 the wound  culture was polymicrobial  infection positive for enterococcus, prevotella and hemophilus parainfluenza. She ahs been on IV vancomycin as OP and currently on UNASYN The blood culture was negative . We need to make sure the fistula is not infected- on communicating with Dr.Schnier he does not think fistula is infected. Pt is ready to go home- She has two options -vanco/ceftazidime and PO flagyl- The first two to be given during dialysis. The 2nd option is IV unasyn every day - This will entail another central line . Patient is rightly not interested in this option. After discussing with her nad the care team she will be discharged on Vancomycin 1731m given during each dilaysis and ceftaziime 1-1-2  And PO flagyl for 2weeks. She will be assessed by vascular and if the wound is improving satisfactorily then we can switch to PO augmentin or else will continue IV for 2 more weeks  ?DM-poorly controlled  ESRD on dialysis ? ___________________________________________________ Discussed with patient,and care team Note:  This document was prepared using Dragon voice recognition software and may include unintentional dictation errors.

## 2019-01-03 DIAGNOSIS — D631 Anemia in chronic kidney disease: Secondary | ICD-10-CM | POA: Diagnosis not present

## 2019-01-03 DIAGNOSIS — S41102A Unspecified open wound of left upper arm, initial encounter: Secondary | ICD-10-CM | POA: Diagnosis not present

## 2019-01-03 DIAGNOSIS — D509 Iron deficiency anemia, unspecified: Secondary | ICD-10-CM | POA: Diagnosis not present

## 2019-01-03 DIAGNOSIS — N186 End stage renal disease: Secondary | ICD-10-CM | POA: Diagnosis not present

## 2019-01-03 DIAGNOSIS — S51832A Puncture wound without foreign body of left forearm, initial encounter: Secondary | ICD-10-CM | POA: Diagnosis not present

## 2019-01-03 DIAGNOSIS — N2581 Secondary hyperparathyroidism of renal origin: Secondary | ICD-10-CM | POA: Diagnosis not present

## 2019-01-04 ENCOUNTER — Telehealth: Payer: Self-pay

## 2019-01-04 DIAGNOSIS — Z992 Dependence on renal dialysis: Secondary | ICD-10-CM | POA: Diagnosis not present

## 2019-01-04 DIAGNOSIS — Z955 Presence of coronary angioplasty implant and graft: Secondary | ICD-10-CM | POA: Diagnosis not present

## 2019-01-04 DIAGNOSIS — I2510 Atherosclerotic heart disease of native coronary artery without angina pectoris: Secondary | ICD-10-CM | POA: Diagnosis not present

## 2019-01-04 DIAGNOSIS — I5042 Chronic combined systolic (congestive) and diastolic (congestive) heart failure: Secondary | ICD-10-CM | POA: Diagnosis not present

## 2019-01-04 DIAGNOSIS — E1122 Type 2 diabetes mellitus with diabetic chronic kidney disease: Secondary | ICD-10-CM | POA: Diagnosis not present

## 2019-01-04 DIAGNOSIS — Z794 Long term (current) use of insulin: Secondary | ICD-10-CM | POA: Diagnosis not present

## 2019-01-04 DIAGNOSIS — Z7982 Long term (current) use of aspirin: Secondary | ICD-10-CM | POA: Diagnosis not present

## 2019-01-04 DIAGNOSIS — N186 End stage renal disease: Secondary | ICD-10-CM | POA: Diagnosis not present

## 2019-01-04 DIAGNOSIS — T8131XA Disruption of external operation (surgical) wound, not elsewhere classified, initial encounter: Secondary | ICD-10-CM | POA: Diagnosis not present

## 2019-01-04 DIAGNOSIS — I132 Hypertensive heart and chronic kidney disease with heart failure and with stage 5 chronic kidney disease, or end stage renal disease: Secondary | ICD-10-CM | POA: Diagnosis not present

## 2019-01-04 NOTE — Telephone Encounter (Signed)
Yes please

## 2019-01-04 NOTE — Telephone Encounter (Signed)
Unable to reach [place] with [facility] at this time. Will try again later.

## 2019-01-04 NOTE — Telephone Encounter (Signed)
[place] with [facility] requesting verbal orders for skilled nursing 3 X a week for 9 weeks for wound vac cleaning, medication management and diabetic education. CB# [phone]

## 2019-01-05 DIAGNOSIS — S41102A Unspecified open wound of left upper arm, initial encounter: Secondary | ICD-10-CM | POA: Diagnosis not present

## 2019-01-05 DIAGNOSIS — S51832A Puncture wound without foreign body of left forearm, initial encounter: Secondary | ICD-10-CM | POA: Diagnosis not present

## 2019-01-05 DIAGNOSIS — N2581 Secondary hyperparathyroidism of renal origin: Secondary | ICD-10-CM | POA: Diagnosis not present

## 2019-01-05 DIAGNOSIS — N186 End stage renal disease: Secondary | ICD-10-CM | POA: Diagnosis not present

## 2019-01-05 DIAGNOSIS — D631 Anemia in chronic kidney disease: Secondary | ICD-10-CM | POA: Diagnosis not present

## 2019-01-05 DIAGNOSIS — D509 Iron deficiency anemia, unspecified: Secondary | ICD-10-CM | POA: Diagnosis not present

## 2019-01-05 NOTE — Telephone Encounter (Signed)
Unable to reach or to leave a voicemail.

## 2019-01-06 ENCOUNTER — Telehealth (INDEPENDENT_AMBULATORY_CARE_PROVIDER_SITE_OTHER): Payer: Self-pay

## 2019-01-06 DIAGNOSIS — I2510 Atherosclerotic heart disease of native coronary artery without angina pectoris: Secondary | ICD-10-CM | POA: Diagnosis not present

## 2019-01-06 DIAGNOSIS — I132 Hypertensive heart and chronic kidney disease with heart failure and with stage 5 chronic kidney disease, or end stage renal disease: Secondary | ICD-10-CM | POA: Diagnosis not present

## 2019-01-06 DIAGNOSIS — T8131XA Disruption of external operation (surgical) wound, not elsewhere classified, initial encounter: Secondary | ICD-10-CM | POA: Diagnosis not present

## 2019-01-06 DIAGNOSIS — N186 End stage renal disease: Secondary | ICD-10-CM | POA: Diagnosis not present

## 2019-01-06 DIAGNOSIS — E1122 Type 2 diabetes mellitus with diabetic chronic kidney disease: Secondary | ICD-10-CM | POA: Diagnosis not present

## 2019-01-06 DIAGNOSIS — I5042 Chronic combined systolic (congestive) and diastolic (congestive) heart failure: Secondary | ICD-10-CM | POA: Diagnosis not present

## 2019-01-06 NOTE — Telephone Encounter (Signed)
New order to use green foam for wound vac has been fax

## 2019-01-06 NOTE — Telephone Encounter (Signed)
Or perhaps could we do an order to use with green foam instead of switching out wound vacs. Could we check that?

## 2019-01-07 DIAGNOSIS — I2510 Atherosclerotic heart disease of native coronary artery without angina pectoris: Secondary | ICD-10-CM | POA: Diagnosis not present

## 2019-01-07 DIAGNOSIS — N186 End stage renal disease: Secondary | ICD-10-CM | POA: Diagnosis not present

## 2019-01-07 DIAGNOSIS — E1122 Type 2 diabetes mellitus with diabetic chronic kidney disease: Secondary | ICD-10-CM | POA: Diagnosis not present

## 2019-01-07 DIAGNOSIS — D509 Iron deficiency anemia, unspecified: Secondary | ICD-10-CM | POA: Diagnosis not present

## 2019-01-07 DIAGNOSIS — N2581 Secondary hyperparathyroidism of renal origin: Secondary | ICD-10-CM | POA: Diagnosis not present

## 2019-01-07 DIAGNOSIS — S41102A Unspecified open wound of left upper arm, initial encounter: Secondary | ICD-10-CM | POA: Diagnosis not present

## 2019-01-07 DIAGNOSIS — S51832A Puncture wound without foreign body of left forearm, initial encounter: Secondary | ICD-10-CM | POA: Diagnosis not present

## 2019-01-07 DIAGNOSIS — I5042 Chronic combined systolic (congestive) and diastolic (congestive) heart failure: Secondary | ICD-10-CM | POA: Diagnosis not present

## 2019-01-07 DIAGNOSIS — I132 Hypertensive heart and chronic kidney disease with heart failure and with stage 5 chronic kidney disease, or end stage renal disease: Secondary | ICD-10-CM | POA: Diagnosis not present

## 2019-01-07 DIAGNOSIS — T8131XA Disruption of external operation (surgical) wound, not elsewhere classified, initial encounter: Secondary | ICD-10-CM | POA: Diagnosis not present

## 2019-01-07 DIAGNOSIS — D631 Anemia in chronic kidney disease: Secondary | ICD-10-CM | POA: Diagnosis not present

## 2019-01-09 ENCOUNTER — Encounter (INDEPENDENT_AMBULATORY_CARE_PROVIDER_SITE_OTHER): Payer: Self-pay | Admitting: Vascular Surgery

## 2019-01-09 ENCOUNTER — Ambulatory Visit (INDEPENDENT_AMBULATORY_CARE_PROVIDER_SITE_OTHER): Payer: Medicare Other | Admitting: Vascular Surgery

## 2019-01-09 ENCOUNTER — Encounter (INDEPENDENT_AMBULATORY_CARE_PROVIDER_SITE_OTHER): Payer: Medicare Other

## 2019-01-09 ENCOUNTER — Other Ambulatory Visit: Payer: Self-pay

## 2019-01-09 VITALS — BP 194/68 | HR 93 | Resp 16 | Wt 291.0 lb

## 2019-01-09 DIAGNOSIS — T829XXD Unspecified complication of cardiac and vascular prosthetic device, implant and graft, subsequent encounter: Secondary | ICD-10-CM

## 2019-01-09 MED ORDER — OXYCODONE HCL 5 MG PO TABS: 5.0000 mg | ORAL_TABLET | Freq: Four times a day (QID) | ORAL | 0 refills | Status: DC | PRN

## 2019-01-09 NOTE — Progress Notes (Deleted)
Patient: [name] Female    DOB: 1956-03-14   63 y.o.   MRN: [id number] Visit Date: 01/10/2019  Today's Provider: [name], PA-C   No chief complaint on file.  Subjective:     HPI   Follow up Hospitalization  Patient was admitted to [facility] on 12/28/2018 and discharged on 01/02/2019 She was treated for  Non-healing wound of upper extremity. Treatment for this included IV vancomycin 1,750mg  and IV ceftazidime 1g three times a week with HD and flagyl 500mg  tid for 2 weeks . Telephone follow up was done on *** She reports {DESC; EXCELLENT/GOOD/FAIR:19992} compliance with treatment. She reports this condition is {improved/worse/unchanged:3041574}.  ------------------------------------------------------------------------------------     Allergies  Allergen Reactions  . Shellfish Allergy Anaphylaxis and Swelling  . Diazepam Other (See Comments)    "felt like out of body experience"     Current Outpatient Medications:  .  Accu-Chek FastClix Lancets MISC, USE AS DIRECTED TO CHECK BLOOD SUGAR THREE TIMES DAILY AND AT BEDTIME, Disp: 102 each, Rfl: 0 .  albuterol (PROAIR HFA) 108 (90 Base) MCG/ACT inhaler, INHALE 2 PUFFS BY MOUTH EVERY 4 HOURS AS NEEDED FOR SHORTNESS OF BREATH (Patient taking differently: Inhale 2 puffs into the lungs every 4 (four) hours as needed for wheezing or shortness of breath. INHALE 2 PUFFS BY MOUTH EVERY 4 HOURS AS NEEDED FOR SHORTNESS OF BREATH), Disp: 8.5 g, Rfl: 5 .  allopurinol (ZYLOPRIM) 100 MG tablet, Take 1 tablet (100 mg total) by mouth every morning., Disp: 90 tablet, Rfl: 1 .  aspirin EC 81 MG EC tablet, Take 1 tablet (81 mg total) by mouth daily., Disp: , Rfl:  .  atorvastatin (LIPITOR) 80 MG tablet, TAKE 1 TABLET BY MOUTH ONCE DAILY AT 6PM (Patient taking differently: Take 80 mg by mouth daily. ), Disp: 28 tablet, Rfl: 10 .  AURYXIA 1 GM 210 MG(Fe) tablet, Take 420 mg by mouth 2 (two) times daily with a meal. , Disp: , Rfl:  .   carvedilol (COREG) 25 MG tablet, TAKE (1) TABLET BY MOUTH TWICE A DAY WITH MEALS (BREAKFAST AND SUPPER) (Patient taking differently: Take 25 mg by mouth 2 (two) times daily with a meal. ), Disp: 56 tablet, Rfl: 10 .  ceftAZIDime (FORTAZ) 1-5 GM/50ML-%, Inject 50 mLs (1 g total) into the vein every dialysis for up to 14 days., Disp: 50 mL, Rfl: 0 .  docusate sodium (COLACE) 100 MG capsule, Take 1 capsule (100 mg total) by mouth 2 (two) times daily as needed for mild constipation., Disp: 30 capsule, Rfl: 0 .  fluticasone (FLONASE) 50 MCG/ACT nasal spray, Place 2 sprays into both nostrils daily as needed for allergies or rhinitis., Disp: 48 g, Rfl: 1 .  hydrALAZINE (APRESOLINE) 100 MG tablet, TAKE (1) TABLET BY MOUTH THREE TIMES DAILY (Patient taking differently: Take 100 mg by mouth 3 (three) times daily. ), Disp: 90 tablet, Rfl: 5 .  hydrOXYzine (ATARAX/VISTARIL) 25 MG tablet, Take 1 tablet (25 mg total) by mouth every 6 (six) hours as needed for itching., Disp: 120 tablet, Rfl: 10 .  insulin glargine (LANTUS) 100 unit/mL SOPN, Inject 0.6 mLs (60 Units total) into the skin daily for 30 days., Disp: 18 mL, Rfl: 0 .  liraglutide (VICTOZA) 18 MG/3ML SOPN, Start with 0.6mg  daily and increase by 0.6mg  per week until max dose of 1.8 mg dose achieved. (Patient taking differently: Inject 0.6 mg into the skin daily. Start with 0.6mg  daily and increase by  0.6mg  per week until max dose of 1.8 mg dose achieved.), Disp: 9 mL, Rfl: 10 .  loratadine (CLARITIN) 10 MG tablet, TAKE 1 TABLET BY MOUTH ONCE DAILY. (Patient taking differently: Take 10 mg by mouth at bedtime. ), Disp: 28 tablet, Rfl: 10 .  metroNIDAZOLE (FLAGYL) 500 MG tablet, Take 1 tablet (500 mg total) by mouth 3 (three) times daily., Disp: 42 tablet, Rfl: 0 .  metroNIDAZOLE (FLAGYL) 500 MG tablet, Take 1 tablet (500 mg total) by mouth 3 (three) times daily for 14 days., Disp: 42 tablet, Rfl: 0 .  mometasone (ELOCON) 0.1 % cream, APPLY AS DIRECTED TO  AFFECTED AREA ONCE DAILY., Disp: 45 g, Rfl: 10 .  mometasone-formoterol (DULERA) 200-5 MCG/ACT AERO, Inhale 2 puffs into the lungs 2 (two) times daily. (Patient taking differently: Inhale 2 puffs into the lungs 2 (two) times daily as needed for wheezing or shortness of breath. ), Disp: 13 g, Rfl: 5 .  montelukast (SINGULAIR) 10 MG tablet, TAKE ONE TABLET BY MOUTH AT BEDTIME. (Patient taking differently: Take 10 mg by mouth at bedtime. ), Disp: 28 tablet, Rfl: 10 .  nitroGLYCERIN (NITROSTAT) 0.4 MG SL tablet, Place 1 tablet (0.4 mg total) under the tongue every 5 (five) minutes x 3 doses as needed for chest pain., Disp: 25 tablet, Rfl: 12 .  NOVOLOG FLEXPEN 100 UNIT/ML FlexPen, INJECT 25 UNITS S.Q. THREE TIMES A DAY WITH MEALS. (Patient taking differently: Inject 30 Units into the skin 3 (three) times daily with meals. ), Disp: 15 mL, Rfl: 10 .  omeprazole (PRILOSEC) 20 MG capsule, TAKE (1) CAPSULE BY MOUTH ONCE DAILY. (Patient taking differently: Take 20 mg by mouth daily. ), Disp: 28 capsule, Rfl: 10 .  oxyCODONE (ROXICODONE) 5 MG immediate release tablet, Take 1-2 tablets (5-10 mg total) by mouth every 6 (six) hours as needed for moderate pain or severe pain., Disp: 50 tablet, Rfl: 0 .  pregabalin (LYRICA) 75 MG capsule, Take 1 capsule (75 mg total) by mouth 2 (two) times daily., Disp: 180 capsule, Rfl: 1 .  SENNA PLUS 8.6-50 MG tablet, TAKE ONE TABLET BY MOUTH AT BEDTIME. (Patient taking differently: Take 1 tablet by mouth at bedtime. ), Disp: 28 tablet, Rfl: 10 .  silver sulfADIAZINE (SILVADENE) 1 % cream, Apply 1 application topically daily. (Patient taking differently: Apply 1 application topically daily as needed (sore). ), Disp: 50 g, Rfl: 0 .  torsemide (DEMADEX) 20 MG tablet, Take 2 tablets (40 mg total) by mouth 2 (two) times daily., Disp: 120 tablet, Rfl: 0 .  traMADol (ULTRAM) 50 MG tablet, One to Two Tabs Every Six Hours as Needed For Pain (Patient taking differently: Take 50-100 mg by mouth  every 6 (six) hours as needed (pain). One to Two Tabs Every Six Hours as Needed For Pain), Disp: 40 tablet, Rfl: 0 .  triamcinolone cream (KENALOG) 0.1 %, APPLY TO AFFECTED AREA TWICE DAILY (Patient taking differently: Apply 1 application topically daily as needed (itching). ), Disp: 80 g, Rfl: 0 .  valACYclovir (VALTREX) 500 MG tablet, TAKE (1) TABLET BY MOUTH EVERY OTHER DAY. (Patient taking differently: Take 500 mg by mouth every other day. ), Disp: 14 tablet, Rfl: 10 .  vancomycin 1,750 mg in sodium chloride 0.9 % 500 mL, Inject 1,750 mg into the vein every dialysis for up to 14 days., Disp: 1750 mg, Rfl: 0 .  Vitamin D, Ergocalciferol, (DRISDOL) 50000 units CAPS capsule, TAKE 1 CAPSULE BY MOUTH ONCE A MONTH (Patient taking differently: Take 50,000  Units by mouth every 30 (thirty) days. ), Disp: 3 capsule, Rfl: 3  Review of Systems  Social History   Tobacco Use  . Smoking status: Former Smoker    Packs/day: 0.25    Years: 6.00    Pack years: 1.50    Types: Cigarettes    Quit date: 10/25/1980    Years since quitting: 38.2  . Smokeless tobacco: Never Used  Substance Use Topics  . Alcohol use: Yes    Comment: rarely      Objective:   There were no vitals taken for this visit. There were no vitals filed for this visit.   Physical Exam   No results found for any visits on 01/10/19.     Assessment & [place], PA-C  [facility]

## 2019-01-10 ENCOUNTER — Inpatient Hospital Stay: Payer: Self-pay | Admitting: Physician Assistant

## 2019-01-10 DIAGNOSIS — S51832A Puncture wound without foreign body of left forearm, initial encounter: Secondary | ICD-10-CM | POA: Diagnosis not present

## 2019-01-10 DIAGNOSIS — S41102A Unspecified open wound of left upper arm, initial encounter: Secondary | ICD-10-CM | POA: Diagnosis not present

## 2019-01-10 DIAGNOSIS — D631 Anemia in chronic kidney disease: Secondary | ICD-10-CM | POA: Diagnosis not present

## 2019-01-10 DIAGNOSIS — N2581 Secondary hyperparathyroidism of renal origin: Secondary | ICD-10-CM | POA: Diagnosis not present

## 2019-01-10 DIAGNOSIS — D509 Iron deficiency anemia, unspecified: Secondary | ICD-10-CM | POA: Diagnosis not present

## 2019-01-10 DIAGNOSIS — N186 End stage renal disease: Secondary | ICD-10-CM | POA: Diagnosis not present

## 2019-01-11 ENCOUNTER — Encounter (INDEPENDENT_AMBULATORY_CARE_PROVIDER_SITE_OTHER): Payer: Self-pay

## 2019-01-11 ENCOUNTER — Encounter (INDEPENDENT_AMBULATORY_CARE_PROVIDER_SITE_OTHER): Payer: Self-pay | Admitting: Vascular Surgery

## 2019-01-11 DIAGNOSIS — S41102A Unspecified open wound of left upper arm, initial encounter: Secondary | ICD-10-CM | POA: Diagnosis not present

## 2019-01-11 DIAGNOSIS — D509 Iron deficiency anemia, unspecified: Secondary | ICD-10-CM | POA: Diagnosis not present

## 2019-01-11 DIAGNOSIS — N2581 Secondary hyperparathyroidism of renal origin: Secondary | ICD-10-CM | POA: Diagnosis not present

## 2019-01-11 DIAGNOSIS — I132 Hypertensive heart and chronic kidney disease with heart failure and with stage 5 chronic kidney disease, or end stage renal disease: Secondary | ICD-10-CM | POA: Diagnosis not present

## 2019-01-11 DIAGNOSIS — T8131XA Disruption of external operation (surgical) wound, not elsewhere classified, initial encounter: Secondary | ICD-10-CM | POA: Diagnosis not present

## 2019-01-11 DIAGNOSIS — E1122 Type 2 diabetes mellitus with diabetic chronic kidney disease: Secondary | ICD-10-CM | POA: Diagnosis not present

## 2019-01-11 DIAGNOSIS — I2510 Atherosclerotic heart disease of native coronary artery without angina pectoris: Secondary | ICD-10-CM | POA: Diagnosis not present

## 2019-01-11 DIAGNOSIS — I5042 Chronic combined systolic (congestive) and diastolic (congestive) heart failure: Secondary | ICD-10-CM | POA: Diagnosis not present

## 2019-01-11 DIAGNOSIS — Z992 Dependence on renal dialysis: Secondary | ICD-10-CM | POA: Diagnosis not present

## 2019-01-11 DIAGNOSIS — D631 Anemia in chronic kidney disease: Secondary | ICD-10-CM | POA: Diagnosis not present

## 2019-01-11 DIAGNOSIS — S51832A Puncture wound without foreign body of left forearm, initial encounter: Secondary | ICD-10-CM | POA: Diagnosis not present

## 2019-01-11 DIAGNOSIS — N186 End stage renal disease: Secondary | ICD-10-CM | POA: Diagnosis not present

## 2019-01-11 NOTE — Progress Notes (Signed)
Patient ID: [name], female   DOB: 11-01-1955, 63 y.o.   MRN: [id number]  Chief Complaint  Patient presents with  . Follow-up    [facility] 1 week wound check    HPI [name] is a 63 y.o. female.    The patient continues to complain of severe pain greater than 10 out of 10 she says no fevers.  She has not had much drainage from her wound.  The VAC was once again removed and she describes rolling of the adherent bandage.  Apparently she has been doing wet-to-dry's at home on her own stating the [facility] will be replaced this coming Wednesday.  Of note this is the second home health agency that we have arranged for her as she had many complaints about the initial 1.   Past Medical History:  Diagnosis Date  . Anemia   . Aortic stenosis    Echo 8/18: mean 13, peak 28, LVOT/AV mean velocity 0.51  . Arthritis   . Asthma    As a child   . Bronchitis   . CAD (coronary artery disease)    a. 09/2016: 50% Ost 1st Mrg stenosis, 50% 2nd Mrg stenosis, 20% Mid-Cx, 95% Prox LAD, 40% mid-LAD, and 10% dist-LAD stenosis. Staged PCI with DES to Prox-LAD.   [name] Chronic combined systolic and diastolic CHF (congestive heart failure) ([place]) 2011   echo 2/18: EF 55-60, normal wall motion, grade 2 diastolic dysfunction, trivial AI // echo 3/18: Septal and apical HK, EF 45-50, normal wall motion, trivial AI, mild LAE, PASP 38 // echo 8/18: EF 60-65, normal wall motion, grade 1 diastolic dysfunction, calcified aortic valve leaflets, mild aortic stenosis (mean 13, peak 28, LVOT/AV mean velocity 0.51), mild AI, moderate MAC, mild LAE, trivial TR   . Chronic kidney disease    STAGE 4  . Chronic kidney disease on chronic dialysis (HCC)    t, th, sat  . Complication of anesthesia   . Depression   . Diabetes mellitus Dx 1989  . Elevated lipids   . GERD (gastroesophageal reflux disease)   . Gout   . Heart murmur    asymptomatic  . Hepatitis C Dx 2013  . Hypertension Dx 1989  . Infected surgical wound    Lt  arm  . Myocardial infarction ([place]) 07/2015  . Obesity   . Pancreatitis 2013  . Pneumonia   . Refusal of blood transfusions as patient is Jehovah's Witness   . Tendinitis   . Tremors of nervous system    LEFT HAND  . Ulcer 2010    Past Surgical History:  Procedure Laterality Date  . APPLICATION OF WOUND VAC Left 08/14/2017   Procedure: APPLICATION OF WOUND VAC Exchange;  Surgeon: Robert Bellow, MD;  Location: [facility];  Service: General;  Laterality: Left;  . APPLICATION OF WOUND VAC Left 12/21/2018   Procedure: APPLICATION OF WOUND VAC;  Surgeon: Katha Cabal, MD;  Location: [facility];  Service: Vascular;  Laterality: Left;  . AV FISTULA PLACEMENT Left 08/19/2018   Procedure: ARTERIOVENOUS (AV) FISTULA CREATION ( BRACHIOBASILIC );  Surgeon: Katha Cabal, MD;  Location: [facility];  Service: Vascular;  Laterality: Left;  . BASCILIC VEIN TRANSPOSITION Left 11/18/2018   Procedure: BASCILIC VEIN TRANSPOSITION;  Surgeon: Katha Cabal, MD;  Location: [facility];  Service: Vascular;  Laterality: Left;  . CHOLECYSTECTOMY    . COLONOSCOPY WITH PROPOFOL N/A 02/03/2018   Procedure: COLONOSCOPY WITH PROPOFOL;  Surgeon: Sherri Sear  Reece Levy, MD;  Location: [place];  Service: Gastroenterology;  Laterality: N/A;  . CORONARY ANGIOPLASTY  07/2015   STENT  . CORONARY STENT INTERVENTION N/A 09/18/2016   Procedure: Coronary Stent Intervention;  Surgeon: Troy Sine, MD;  Location: [place] [facility];  Service: Cardiovascular;  Laterality: N/A;  . DIALYSIS/PERMA CATHETER INSERTION N/A 05/10/2018   Procedure: DIALYSIS/PERMA CATHETER INSERTION;  Surgeon: Katha Cabal, MD;  Location: [place] [facility];  Service: Cardiovascular;  Laterality: N/A;  . DRESSING CHANGE UNDER ANESTHESIA Left 08/15/2017   Procedure: exploration of wound for bleeding;  Surgeon: Robert Bellow, MD;  Location: [facility];  Service: General;  Laterality: Left;  . ESOPHAGOGASTRODUODENOSCOPY (EGD) WITH PROPOFOL  N/A 02/03/2018   Procedure: ESOPHAGOGASTRODUODENOSCOPY (EGD) WITH PROPOFOL;  Surgeon: Lin Landsman, MD;  Location: [facility];  Service: Gastroenterology;  Laterality: N/A;  . EYE SURGERY  11/17/2018  . INCISION AND DRAINAGE ABSCESS Left 08/12/2017   Procedure: INCISION AND DRAINAGE ABSCESS;  Surgeon: Robert Bellow, MD;  Location: [facility];  Service: General;  Laterality: Left;  . KNEE ARTHROSCOPY    . LEFT HEART CATH N/A 09/18/2016   Procedure: Left Heart Cath;  Surgeon: Troy Sine, MD;  Location: [place] [facility];  Service: Cardiovascular;  Laterality: N/A;  . LEFT HEART CATH AND CORONARY ANGIOGRAPHY N/A 09/16/2016   Procedure: Left Heart Cath and Coronary Angiography;  Surgeon: Burnell Blanks, MD;  Location: [place] [facility];  Service: Cardiovascular;  Laterality: N/A;  . LEFT HEART CATH AND CORONARY ANGIOGRAPHY N/A 04/29/2017   Procedure: LEFT HEART CATH AND CORONARY ANGIOGRAPHY;  Surgeon: Nelva Bush, MD;  Location: [place] [facility];  Service: Cardiovascular;  Laterality: N/A;  . LOWER EXTREMITY ANGIOGRAPHY Right 03/08/2018   Procedure: LOWER EXTREMITY ANGIOGRAPHY;  Surgeon: Katha Cabal, MD;  Location: [place] [facility];  Service: Cardiovascular;  Laterality: Right;  . TUBAL LIGATION    . TUBAL LIGATION    . WOUND DEBRIDEMENT Left 12/21/2018   Procedure: DEBRIDEMENT WOUND;  Surgeon: Katha Cabal, MD;  Location: [facility];  Service: Vascular;  Laterality: Left;  . WOUND DEBRIDEMENT Left 12/30/2018   Procedure: DEBRIDEMENT WOUND WITH VAC PLACEMENT (LEFT UPPER EXTREMITY);  Surgeon: Katha Cabal, MD;  Location: [facility];  Service: Vascular;  Laterality: Left;      Allergies  Allergen Reactions  . Shellfish Allergy Anaphylaxis and Swelling  . Diazepam Other (See Comments)    "felt like out of body experience"    Current Outpatient Medications  Medication Sig Dispense Refill  . Accu-Chek FastClix Lancets MISC USE AS DIRECTED TO CHECK  BLOOD SUGAR THREE TIMES DAILY AND AT BEDTIME 102 each 0  . albuterol (PROAIR HFA) 108 (90 Base) MCG/ACT inhaler INHALE 2 PUFFS BY MOUTH EVERY 4 HOURS AS NEEDED FOR SHORTNESS OF BREATH (Patient taking differently: Inhale 2 puffs into the lungs every 4 (four) hours as needed for wheezing or shortness of breath. INHALE 2 PUFFS BY MOUTH EVERY 4 HOURS AS NEEDED FOR SHORTNESS OF BREATH) 8.5 g 5  . allopurinol (ZYLOPRIM) 100 MG tablet Take 1 tablet (100 mg total) by mouth every morning. 90 tablet 1  . aspirin EC 81 MG EC tablet Take 1 tablet (81 mg total) by mouth daily.    [name] atorvastatin (LIPITOR) 80 MG tablet TAKE 1 TABLET BY MOUTH ONCE DAILY AT 6PM (Patient taking differently: Take 80 mg by mouth daily. ) 28 tablet 10  . AURYXIA 1 GM 210 MG(Fe) tablet  Take 420 mg by mouth 2 (two) times daily with a meal.     . carvedilol (COREG) 25 MG tablet TAKE (1) TABLET BY MOUTH TWICE A DAY WITH MEALS (BREAKFAST AND SUPPER) (Patient taking differently: Take 25 mg by mouth 2 (two) times daily with a meal. ) 56 tablet 10  . ceftAZIDime (FORTAZ) 1-5 GM/50ML-% Inject 50 mLs (1 g total) into the vein every dialysis for up to 14 days. 50 mL 0  . docusate sodium (COLACE) 100 MG capsule Take 1 capsule (100 mg total) by mouth 2 (two) times daily as needed for mild constipation. 30 capsule 0  . fluticasone (FLONASE) 50 MCG/ACT nasal spray Place 2 sprays into both nostrils daily as needed for allergies or rhinitis. 48 g 1  . hydrALAZINE (APRESOLINE) 100 MG tablet TAKE (1) TABLET BY MOUTH THREE TIMES DAILY (Patient taking differently: Take 100 mg by mouth 3 (three) times daily. ) 90 tablet 5  . hydrOXYzine (ATARAX/VISTARIL) 25 MG tablet Take 1 tablet (25 mg total) by mouth every 6 (six) hours as needed for itching. 120 tablet 10  . insulin glargine (LANTUS) 100 unit/mL SOPN Inject 0.6 mLs (60 Units total) into the skin daily for 30 days. 18 mL 0  . liraglutide (VICTOZA) 18 MG/3ML SOPN Start with 0.30m daily and increase by 0.654m per week until max dose of 1.8 mg dose achieved. (Patient taking differently: Inject 0.6 mg into the skin daily. Start with 0.57m43maily and increase by 0.57mg50mr week until max dose of 1.8 mg dose achieved.) 9 mL 10  . loratadine (CLARITIN) 10 MG tablet TAKE 1 TABLET BY MOUTH ONCE DAILY. (Patient taking differently: Take 10 mg by mouth at bedtime. ) 28 tablet 10  . metroNIDAZOLE (FLAGYL) 500 MG tablet Take 1 tablet (500 mg total) by mouth 3 (three) times daily. 42 tablet 0  . metroNIDAZOLE (FLAGYL) 500 MG tablet Take 1 tablet (500 mg total) by mouth 3 (three) times daily for 14 days. 42 tablet 0  . mometasone (ELOCON) 0.1 % cream APPLY AS DIRECTED TO AFFECTED AREA ONCE DAILY. 45 g 10  . mometasone-formoterol (DULERA) 200-5 MCG/ACT AERO Inhale 2 puffs into the lungs 2 (two) times daily. (Patient taking differently: Inhale 2 puffs into the lungs 2 (two) times daily as needed for wheezing or shortness of breath. ) 13 g 5  . montelukast (SINGULAIR) 10 MG tablet TAKE ONE TABLET BY MOUTH AT BEDTIME. (Patient taking differently: Take 10 mg by mouth at bedtime. ) 28 tablet 10  . nitroGLYCERIN (NITROSTAT) 0.4 MG SL tablet Place 1 tablet (0.4 mg total) under the tongue every 5 (five) minutes x 3 doses as needed for chest pain. 25 tablet 12  . NOVOLOG FLEXPEN 100 UNIT/ML FlexPen INJECT 25 UNITS S.Q. THREE TIMES A DAY WITH MEALS. (Patient taking differently: Inject 30 Units into the skin 3 (three) times daily with meals. ) 15 mL 10  . omeprazole (PRILOSEC) 20 MG capsule TAKE (1) CAPSULE BY MOUTH ONCE DAILY. (Patient taking differently: Take 20 mg by mouth daily. ) 28 capsule 10  . oxyCODONE (ROXICODONE) 5 MG immediate release tablet Take 1-2 tablets (5-10 mg total) by mouth every 6 (six) hours as needed for moderate pain or severe pain. 50 tablet 0  . pregabalin (LYRICA) 75 MG capsule Take 1 capsule (75 mg total) by mouth 2 (two) times daily. 180 capsule 1  . SENNA PLUS 8.6-50 MG tablet TAKE ONE TABLET BY MOUTH AT  BEDTIME. (Patient taking differently: Take  1 tablet by mouth at bedtime. ) 28 tablet 10  . silver sulfADIAZINE (SILVADENE) 1 % cream Apply 1 application topically daily. (Patient taking differently: Apply 1 application topically daily as needed (sore). ) 50 g 0  . torsemide (DEMADEX) 20 MG tablet Take 2 tablets (40 mg total) by mouth 2 (two) times daily. 120 tablet 0  . traMADol (ULTRAM) 50 MG tablet One to Two Tabs Every Six Hours as Needed For Pain (Patient taking differently: Take 50-100 mg by mouth every 6 (six) hours as needed (pain). One to Two Tabs Every Six Hours as Needed For Pain) 40 tablet 0  . triamcinolone cream (KENALOG) 0.1 % APPLY TO AFFECTED AREA TWICE DAILY (Patient taking differently: Apply 1 application topically daily as needed (itching). ) 80 g 0  . valACYclovir (VALTREX) 500 MG tablet TAKE (1) TABLET BY MOUTH EVERY OTHER DAY. (Patient taking differently: Take 500 mg by mouth every other day. ) 14 tablet 10  . vancomycin 1,750 mg in sodium chloride 0.9 % 500 mL Inject 1,750 mg into the vein every dialysis for up to 14 days. 1750 mg 0  . Vitamin D, Ergocalciferol, (DRISDOL) 50000 units CAPS capsule TAKE 1 CAPSULE BY MOUTH ONCE A MONTH (Patient taking differently: Take 50,000 Units by mouth every 30 (thirty) days. ) 3 capsule 3   No current facility-administered medications for this visit.         Physical Exam BP (!) 194/68 (BP Location: Right Arm)   Pulse 93   Resp 16   Wt 291 lb (132 kg)   BMI 44.25 kg/m  Gen:  WD/WN, NAD Skin: The wound is clean and granulating.  It has not contracted much.  There is no odor.  There is still fairly marked swelling of the arm.  Fistula is palpable and has a good thrill good bruit.   Assessment/Plan:  1. Complication from renal dialysis device, subsequent encounter Continue wound care as ordered.  I have strongly encouraged the patient to continue to use the [facility] as I believe this will shorten the length of time that were dealing  with this open wound.  Of also encouraged her to elevate quite a bit.  I have contacted the pharmacy and reviewed the [place].  Although she has used 2 providers for her pain medications she is not deviated from the prescriptions.  I will continue her pain medications for now.  She certainly has a reasonable indication for pain relief.      [name] 01/11/2019, 7:41 AM   This note was created with Dragon medical transcription system.  Any errors from dictation are unintentional.

## 2019-01-12 DIAGNOSIS — S41102A Unspecified open wound of left upper arm, initial encounter: Secondary | ICD-10-CM | POA: Diagnosis not present

## 2019-01-12 DIAGNOSIS — N186 End stage renal disease: Secondary | ICD-10-CM | POA: Diagnosis not present

## 2019-01-12 DIAGNOSIS — S51832A Puncture wound without foreign body of left forearm, initial encounter: Secondary | ICD-10-CM | POA: Diagnosis not present

## 2019-01-12 DIAGNOSIS — D509 Iron deficiency anemia, unspecified: Secondary | ICD-10-CM | POA: Diagnosis not present

## 2019-01-12 DIAGNOSIS — N2581 Secondary hyperparathyroidism of renal origin: Secondary | ICD-10-CM | POA: Diagnosis not present

## 2019-01-12 DIAGNOSIS — E1129 Type 2 diabetes mellitus with other diabetic kidney complication: Secondary | ICD-10-CM | POA: Diagnosis not present

## 2019-01-12 DIAGNOSIS — D631 Anemia in chronic kidney disease: Secondary | ICD-10-CM | POA: Diagnosis not present

## 2019-01-13 DIAGNOSIS — I5042 Chronic combined systolic (congestive) and diastolic (congestive) heart failure: Secondary | ICD-10-CM | POA: Diagnosis not present

## 2019-01-13 DIAGNOSIS — I132 Hypertensive heart and chronic kidney disease with heart failure and with stage 5 chronic kidney disease, or end stage renal disease: Secondary | ICD-10-CM | POA: Diagnosis not present

## 2019-01-13 DIAGNOSIS — I2510 Atherosclerotic heart disease of native coronary artery without angina pectoris: Secondary | ICD-10-CM | POA: Diagnosis not present

## 2019-01-13 DIAGNOSIS — E1122 Type 2 diabetes mellitus with diabetic chronic kidney disease: Secondary | ICD-10-CM | POA: Diagnosis not present

## 2019-01-13 DIAGNOSIS — T8131XA Disruption of external operation (surgical) wound, not elsewhere classified, initial encounter: Secondary | ICD-10-CM | POA: Diagnosis not present

## 2019-01-13 DIAGNOSIS — N186 End stage renal disease: Secondary | ICD-10-CM | POA: Diagnosis not present

## 2019-01-14 DIAGNOSIS — S41102A Unspecified open wound of left upper arm, initial encounter: Secondary | ICD-10-CM | POA: Diagnosis not present

## 2019-01-14 DIAGNOSIS — N186 End stage renal disease: Secondary | ICD-10-CM | POA: Diagnosis not present

## 2019-01-14 DIAGNOSIS — N2581 Secondary hyperparathyroidism of renal origin: Secondary | ICD-10-CM | POA: Diagnosis not present

## 2019-01-14 DIAGNOSIS — S51832A Puncture wound without foreign body of left forearm, initial encounter: Secondary | ICD-10-CM | POA: Diagnosis not present

## 2019-01-14 DIAGNOSIS — D631 Anemia in chronic kidney disease: Secondary | ICD-10-CM | POA: Diagnosis not present

## 2019-01-14 DIAGNOSIS — D509 Iron deficiency anemia, unspecified: Secondary | ICD-10-CM | POA: Diagnosis not present

## 2019-01-17 DIAGNOSIS — N2581 Secondary hyperparathyroidism of renal origin: Secondary | ICD-10-CM | POA: Diagnosis not present

## 2019-01-17 DIAGNOSIS — S51832A Puncture wound without foreign body of left forearm, initial encounter: Secondary | ICD-10-CM | POA: Diagnosis not present

## 2019-01-17 DIAGNOSIS — N186 End stage renal disease: Secondary | ICD-10-CM | POA: Diagnosis not present

## 2019-01-17 DIAGNOSIS — D631 Anemia in chronic kidney disease: Secondary | ICD-10-CM | POA: Diagnosis not present

## 2019-01-17 DIAGNOSIS — D509 Iron deficiency anemia, unspecified: Secondary | ICD-10-CM | POA: Diagnosis not present

## 2019-01-17 DIAGNOSIS — S41102A Unspecified open wound of left upper arm, initial encounter: Secondary | ICD-10-CM | POA: Diagnosis not present

## 2019-01-18 ENCOUNTER — Other Ambulatory Visit: Payer: Self-pay | Admitting: Physician Assistant

## 2019-01-18 DIAGNOSIS — I5042 Chronic combined systolic (congestive) and diastolic (congestive) heart failure: Secondary | ICD-10-CM | POA: Diagnosis not present

## 2019-01-18 DIAGNOSIS — E1122 Type 2 diabetes mellitus with diabetic chronic kidney disease: Secondary | ICD-10-CM | POA: Diagnosis not present

## 2019-01-18 DIAGNOSIS — T8131XA Disruption of external operation (surgical) wound, not elsewhere classified, initial encounter: Secondary | ICD-10-CM | POA: Diagnosis not present

## 2019-01-18 DIAGNOSIS — N186 End stage renal disease: Secondary | ICD-10-CM | POA: Diagnosis not present

## 2019-01-18 DIAGNOSIS — I2510 Atherosclerotic heart disease of native coronary artery without angina pectoris: Secondary | ICD-10-CM | POA: Diagnosis not present

## 2019-01-18 DIAGNOSIS — I132 Hypertensive heart and chronic kidney disease with heart failure and with stage 5 chronic kidney disease, or end stage renal disease: Secondary | ICD-10-CM | POA: Diagnosis not present

## 2019-01-18 DIAGNOSIS — E1142 Type 2 diabetes mellitus with diabetic polyneuropathy: Secondary | ICD-10-CM

## 2019-01-18 NOTE — Telephone Encounter (Signed)
[place] faxed refill request for the following medications:  pregabalin (LYRICA) 75 MG capsule    Please advise.

## 2019-01-19 ENCOUNTER — Other Ambulatory Visit: Payer: Self-pay | Admitting: Physician Assistant

## 2019-01-19 DIAGNOSIS — D631 Anemia in chronic kidney disease: Secondary | ICD-10-CM | POA: Diagnosis not present

## 2019-01-19 DIAGNOSIS — S51832A Puncture wound without foreign body of left forearm, initial encounter: Secondary | ICD-10-CM | POA: Diagnosis not present

## 2019-01-19 DIAGNOSIS — N186 End stage renal disease: Secondary | ICD-10-CM | POA: Diagnosis not present

## 2019-01-19 DIAGNOSIS — D509 Iron deficiency anemia, unspecified: Secondary | ICD-10-CM | POA: Diagnosis not present

## 2019-01-19 DIAGNOSIS — I10 Essential (primary) hypertension: Secondary | ICD-10-CM

## 2019-01-19 DIAGNOSIS — J301 Allergic rhinitis due to pollen: Secondary | ICD-10-CM

## 2019-01-19 DIAGNOSIS — E1142 Type 2 diabetes mellitus with diabetic polyneuropathy: Secondary | ICD-10-CM

## 2019-01-19 DIAGNOSIS — J9601 Acute respiratory failure with hypoxia: Secondary | ICD-10-CM

## 2019-01-19 DIAGNOSIS — N2581 Secondary hyperparathyroidism of renal origin: Secondary | ICD-10-CM | POA: Diagnosis not present

## 2019-01-19 DIAGNOSIS — I5032 Chronic diastolic (congestive) heart failure: Secondary | ICD-10-CM

## 2019-01-19 DIAGNOSIS — S41102A Unspecified open wound of left upper arm, initial encounter: Secondary | ICD-10-CM | POA: Diagnosis not present

## 2019-01-19 MED ORDER — PREGABALIN 75 MG PO CAPS: 75.0000 mg | ORAL_CAPSULE | Freq: Two times a day (BID) | ORAL | 1 refills | Status: DC

## 2019-01-20 DIAGNOSIS — T8131XA Disruption of external operation (surgical) wound, not elsewhere classified, initial encounter: Secondary | ICD-10-CM | POA: Diagnosis not present

## 2019-01-20 DIAGNOSIS — I2510 Atherosclerotic heart disease of native coronary artery without angina pectoris: Secondary | ICD-10-CM | POA: Diagnosis not present

## 2019-01-20 DIAGNOSIS — I132 Hypertensive heart and chronic kidney disease with heart failure and with stage 5 chronic kidney disease, or end stage renal disease: Secondary | ICD-10-CM | POA: Diagnosis not present

## 2019-01-20 DIAGNOSIS — I5042 Chronic combined systolic (congestive) and diastolic (congestive) heart failure: Secondary | ICD-10-CM | POA: Diagnosis not present

## 2019-01-20 DIAGNOSIS — E1122 Type 2 diabetes mellitus with diabetic chronic kidney disease: Secondary | ICD-10-CM | POA: Diagnosis not present

## 2019-01-20 DIAGNOSIS — N186 End stage renal disease: Secondary | ICD-10-CM | POA: Diagnosis not present

## 2019-01-21 DIAGNOSIS — S51832A Puncture wound without foreign body of left forearm, initial encounter: Secondary | ICD-10-CM | POA: Diagnosis not present

## 2019-01-21 DIAGNOSIS — D509 Iron deficiency anemia, unspecified: Secondary | ICD-10-CM | POA: Diagnosis not present

## 2019-01-21 DIAGNOSIS — D631 Anemia in chronic kidney disease: Secondary | ICD-10-CM | POA: Diagnosis not present

## 2019-01-21 DIAGNOSIS — N186 End stage renal disease: Secondary | ICD-10-CM | POA: Diagnosis not present

## 2019-01-21 DIAGNOSIS — N2581 Secondary hyperparathyroidism of renal origin: Secondary | ICD-10-CM | POA: Diagnosis not present

## 2019-01-21 DIAGNOSIS — S41102A Unspecified open wound of left upper arm, initial encounter: Secondary | ICD-10-CM | POA: Diagnosis not present

## 2019-01-23 DIAGNOSIS — N186 End stage renal disease: Secondary | ICD-10-CM | POA: Diagnosis not present

## 2019-01-23 DIAGNOSIS — T8131XA Disruption of external operation (surgical) wound, not elsewhere classified, initial encounter: Secondary | ICD-10-CM | POA: Diagnosis not present

## 2019-01-23 DIAGNOSIS — I2510 Atherosclerotic heart disease of native coronary artery without angina pectoris: Secondary | ICD-10-CM | POA: Diagnosis not present

## 2019-01-23 DIAGNOSIS — I5042 Chronic combined systolic (congestive) and diastolic (congestive) heart failure: Secondary | ICD-10-CM | POA: Diagnosis not present

## 2019-01-23 DIAGNOSIS — I132 Hypertensive heart and chronic kidney disease with heart failure and with stage 5 chronic kidney disease, or end stage renal disease: Secondary | ICD-10-CM | POA: Diagnosis not present

## 2019-01-23 DIAGNOSIS — E1122 Type 2 diabetes mellitus with diabetic chronic kidney disease: Secondary | ICD-10-CM | POA: Diagnosis not present

## 2019-01-23 NOTE — Progress Notes (Deleted)
{Choose 1 Note Type (Telehealth Visit or Telephone Visit):[phone]}   Date:  01/23/2019   ID:  [name], DOB Jan 26, 1956, MRN [id number]  {Patient Location:[phone]::"Home"} {Provider Location:[phone]::"Home"}  PCP:  [name], PA-C  Cardiologist:  Sherren Mocha, MD  Electrophysiologist:  None   Evaluation Performed:  {Choose Visit Type:[id number]::"Follow-Up Visit"}  Chief Complaint:  ***  History of Present Illness:    [name] is a 63 y.o. female we are following for ongoing assessment and management of hypertension, hyperlipidemia chronic diastolic heart failure, mild left ear, coronary artery disease, with cardiac catheterization in 2018 revealing 95% proximal LAD with PCI and Synergy DES placement.  She is on aspirin and Brilinta.  Repeat cardiac catheterization in October 2018 was completed due to recurrent angina and found to have widely patent LAD stent, there was mild to moderate disease in her circumflex, obtuse marginal, diagonal.  LVEF was normal.  She was continued on medical therapy. Other history includes diabetes, chronic kidney disease, hep C, OSA, and medical noncompliance.   She was last seen in our office on 07/18/2018 for preoperative evaluation, for AV fistula placement to the left arm in anticipation of dialysis..   The patient {does/does not:200015} have symptoms concerning for COVID-19 infection (fever, chills, cough, or new shortness of breath).    Past Medical History:  Diagnosis Date  . Anemia   . Aortic stenosis    Echo 8/18: mean 13, peak 28, LVOT/AV mean velocity 0.51  . Arthritis   . Asthma    As a child   . Bronchitis   . CAD (coronary artery disease)    a. 09/2016: 50% Ost 1st Mrg stenosis, 50% 2nd Mrg stenosis, 20% Mid-Cx, 95% Prox LAD, 40% mid-LAD, and 10% dist-LAD stenosis. Staged PCI with DES to Prox-LAD.   [name] Chronic combined systolic and diastolic CHF (congestive heart failure) ([place]) 2011   echo 2/18: EF 55-60,  normal wall motion, grade 2 diastolic dysfunction, trivial AI // echo 3/18: Septal and apical HK, EF 45-50, normal wall motion, trivial AI, mild LAE, PASP 38 // echo 8/18: EF 60-65, normal wall motion, grade 1 diastolic dysfunction, calcified aortic valve leaflets, mild aortic stenosis (mean 13, peak 28, LVOT/AV mean velocity 0.51), mild AI, moderate MAC, mild LAE, trivial TR   . Chronic kidney disease    STAGE 4  . Chronic kidney disease on chronic dialysis (HCC)    t, th, sat  . Complication of anesthesia   . Depression   . Diabetes mellitus Dx 1989  . Elevated lipids   . GERD (gastroesophageal reflux disease)   . Gout   . Heart murmur    asymptomatic  . Hepatitis C Dx 2013  . Hypertension Dx 1989  . Infected surgical wound    Lt arm  . Myocardial infarction ([place]) 07/2015  . Obesity   . Pancreatitis 2013  . Pneumonia   . Refusal of blood transfusions as patient is Jehovah's Witness   . Tendinitis   . Tremors of nervous system    LEFT HAND  . Ulcer 2010   Past Surgical History:  Procedure Laterality Date  . APPLICATION OF WOUND VAC Left 08/14/2017   Procedure: APPLICATION OF WOUND VAC Exchange;  Surgeon: Robert Bellow, MD;  Location: [facility];  Service: General;  Laterality: Left;  . APPLICATION OF WOUND VAC Left 12/21/2018   Procedure: APPLICATION OF WOUND VAC;  Surgeon: Katha Cabal, MD;  Location: [facility];  Service: Vascular;  Laterality: Left;  .  AV FISTULA PLACEMENT Left 08/19/2018   Procedure: ARTERIOVENOUS (AV) FISTULA CREATION ( BRACHIOBASILIC );  Surgeon: Katha Cabal, MD;  Location: [facility];  Service: Vascular;  Laterality: Left;  . BASCILIC VEIN TRANSPOSITION Left 11/18/2018   Procedure: BASCILIC VEIN TRANSPOSITION;  Surgeon: Katha Cabal, MD;  Location: [facility];  Service: Vascular;  Laterality: Left;  . CHOLECYSTECTOMY    . COLONOSCOPY WITH PROPOFOL N/A 02/03/2018   Procedure: COLONOSCOPY WITH PROPOFOL;  Surgeon: Lin Landsman, MD;   Location: [facility];  Service: Gastroenterology;  Laterality: N/A;  . CORONARY ANGIOPLASTY  07/2015   STENT  . CORONARY STENT INTERVENTION N/A 09/18/2016   Procedure: Coronary Stent Intervention;  Surgeon: Troy Sine, MD;  Location: [place] [facility];  Service: Cardiovascular;  Laterality: N/A;  . DIALYSIS/PERMA CATHETER INSERTION N/A 05/10/2018   Procedure: DIALYSIS/PERMA CATHETER INSERTION;  Surgeon: Katha Cabal, MD;  Location: [place] [facility];  Service: Cardiovascular;  Laterality: N/A;  . DRESSING CHANGE UNDER ANESTHESIA Left 08/15/2017   Procedure: exploration of wound for bleeding;  Surgeon: Robert Bellow, MD;  Location: [facility];  Service: General;  Laterality: Left;  . ESOPHAGOGASTRODUODENOSCOPY (EGD) WITH PROPOFOL N/A 02/03/2018   Procedure: ESOPHAGOGASTRODUODENOSCOPY (EGD) WITH PROPOFOL;  Surgeon: Lin Landsman, MD;  Location: [facility] ENDOSCOPY;  Service: Gastroenterology;  Laterality: N/A;  . EYE SURGERY  11/17/2018  . INCISION AND DRAINAGE ABSCESS Left 08/12/2017   Procedure: INCISION AND DRAINAGE ABSCESS;  Surgeon: Robert Bellow, MD;  Location: [facility];  Service: General;  Laterality: Left;  . KNEE ARTHROSCOPY    . LEFT HEART CATH N/A 09/18/2016   Procedure: Left Heart Cath;  Surgeon: Troy Sine, MD;  Location: [place] [facility];  Service: Cardiovascular;  Laterality: N/A;  . LEFT HEART CATH AND CORONARY ANGIOGRAPHY N/A 09/16/2016   Procedure: Left Heart Cath and Coronary Angiography;  Surgeon: Burnell Blanks, MD;  Location: [place] [facility];  Service: Cardiovascular;  Laterality: N/A;  . LEFT HEART CATH AND CORONARY ANGIOGRAPHY N/A 04/29/2017   Procedure: LEFT HEART CATH AND CORONARY ANGIOGRAPHY;  Surgeon: Nelva Bush, MD;  Location: [place] [facility];  Service: Cardiovascular;  Laterality: N/A;  . LOWER EXTREMITY ANGIOGRAPHY Right 03/08/2018   Procedure: LOWER EXTREMITY ANGIOGRAPHY;  Surgeon: Katha Cabal, MD;  Location: [place] [facility];  Service: Cardiovascular;  Laterality: Right;  . TUBAL LIGATION    . TUBAL LIGATION    . WOUND DEBRIDEMENT Left 12/21/2018   Procedure: DEBRIDEMENT WOUND;  Surgeon: Katha Cabal, MD;  Location: [facility];  Service: Vascular;  Laterality: Left;  . WOUND DEBRIDEMENT Left 12/30/2018   Procedure: DEBRIDEMENT WOUND WITH VAC PLACEMENT (LEFT UPPER EXTREMITY);  Surgeon: Katha Cabal, MD;  Location: [facility];  Service: Vascular;  Laterality: Left;     No outpatient medications have been marked as taking for the 01/24/19 encounter (Appointment) with Lendon Colonel, NP.     Allergies:   Shellfish allergy and Diazepam   Social History   Tobacco Use  . Smoking status: Former Smoker    Packs/day: 0.25    Years: 6.00    Pack years: 1.50    Types: Cigarettes    Quit date: 10/25/1980    Years since quitting: 38.2  . Smokeless tobacco: Never Used  Substance Use Topics  . Alcohol use: Yes    Comment: rarely  . Drug use: Not Currently    Types: "Crack" cocaine, Marijuana    Comment: 08/21/2016 "clean  since 05/1998"     Family Hx: The patient's family history includes Colon cancer in her mother; Diabetes in her paternal grandmother; Heart attack in her maternal grandmother and another family member; Hypertension in her brother and sister. There is no history of Breast cancer.  ROS:   Please see the history of present illness.    *** All other systems reviewed and are negative.   Prior CV studies:   The following studies were reviewed today: Last cath 04/2017  Conclusions: 1. Widely patent mid LAD stent. 2. No significant change in mild to moderate LCx, OM, and diagonal disease compared with 09/2016. 3. Normal left ventricular filling pressure.  Recommendations: 1. Continue medical therapy and secondary prevention. I will restart isosorbide mononitrate at 90 mg daily with hope of weaning off NTG infusion later today. Gentle hydration given CKD and normal  LVEDP. Hold furosemide today and reassess volume status and renal function tomorrow before restarting diuretic therapy.   Echo 09/15/17 Study Conclusions  - Left ventricle: The cavity size was at the upper limits of normal. Wall thickness was increased in a pattern of mild LVH. Systolic function was vigorous. The estimated ejection fraction was in the range of 65% to 70%. Wall motion was normal; there were no regional wall motion abnormalities. Doppler parameters are consistent with abnormal left ventricular relaxation (grade 1 diastolic dysfunction). Doppler parameters are consistent with high ventricular filling pressure. - Aortic valve: Transvalvular velocity was increased. There was moderate stenosis. There was trivial regurgitation. Peak velocity (S): 308 cm/s. Mean gradient (S): 20 mm Hg. Valve area (VTI): 1.34 cm^2. Valve area (Vmax): 1.11 cm^2. - Ascending aorta: The ascending aorta was mildly dilated. - Mitral valve: Calcified annulus. Mildly thickened leaflets . - Left atrium: The atrium was mildly dilated. - Right ventricle: The cavity size was normal. Systolic function was normal. - Inferior vena cava: The vessel was dilated. The respirophasic diameter changes were blunted (<50%), consistent with elevated central venous pressure. - Pericardium, extracardiac: A trivial pericardial effusion was identified posterior to the heart.  Labs/Other Tests and Data Reviewed:    EKG:  {EKG/Telemetry Strips Reviewed:[phone]}  Recent Labs: 12/28/2018: ALT 23; TSH 1.560 12/30/2018: Magnesium 1.8 01/02/2019: BUN 63; Creatinine, Ser 6.23; Hemoglobin 9.0; Platelets 201; Potassium 4.5; Sodium 135   Recent Lipid Panel Lab Results  Component Value Date/Time   CHOL 122 09/13/2016 12:45 PM   TRIG 168 (H) 09/13/2016 12:45 PM   HDL 36 (L) 09/13/2016 12:45 PM   CHOLHDL 3.4 09/13/2016 12:45 PM   LDLCALC 52 09/13/2016 12:45 PM    Wt Readings from Last 3  Encounters:  01/09/19 291 lb (132 kg)  12/29/18 294 lb 1.5 oz (133.4 kg)  12/21/18 289 lb (131.1 kg)     Objective:    Vital Signs:  There were no vitals taken for this visit.   {[facility] Virtual Exam (Optional):[phone]::"VITAL SIGNS:  reviewed"}  ASSESSMENT & PLAN:    1. ***  COVID-19 Education: The signs and symptoms of COVID-19 were discussed with the patient and how to seek care for testing (follow up with PCP or arrange E-visit).  ***The importance of social distancing was discussed today.  Time:   Today, I have spent *** minutes with the patient with telehealth technology discussing the above problems.     Medication Adjustments/Labs and Tests Ordered: Current medicines are reviewed at length with the patient today.  Concerns regarding medicines are outlined above.   Tests Ordered: No orders of the defined types were placed  in this encounter.   Medication Changes: No orders of the defined types were placed in this encounter.   Disposition:  Follow up {follow up:15908}  Signed, Phill Myron. West Pugh, ANP, AACC  01/23/2019 7:55 AM    [facility]

## 2019-01-24 ENCOUNTER — Encounter (INDEPENDENT_AMBULATORY_CARE_PROVIDER_SITE_OTHER): Payer: Self-pay

## 2019-01-24 ENCOUNTER — Telehealth: Payer: Medicare Other | Admitting: Adult Health

## 2019-01-24 DIAGNOSIS — S41102A Unspecified open wound of left upper arm, initial encounter: Secondary | ICD-10-CM | POA: Diagnosis not present

## 2019-01-24 DIAGNOSIS — S51832A Puncture wound without foreign body of left forearm, initial encounter: Secondary | ICD-10-CM | POA: Diagnosis not present

## 2019-01-24 DIAGNOSIS — N2581 Secondary hyperparathyroidism of renal origin: Secondary | ICD-10-CM | POA: Diagnosis not present

## 2019-01-24 DIAGNOSIS — N186 End stage renal disease: Secondary | ICD-10-CM | POA: Diagnosis not present

## 2019-01-24 DIAGNOSIS — D631 Anemia in chronic kidney disease: Secondary | ICD-10-CM | POA: Diagnosis not present

## 2019-01-24 DIAGNOSIS — D509 Iron deficiency anemia, unspecified: Secondary | ICD-10-CM | POA: Diagnosis not present

## 2019-01-24 NOTE — Telephone Encounter (Signed)
We can call a refill next week since we still have essentially a week left on that Rx.  As far as the wound vac, it is hard to give an exact time frame but generally when the wound gets too shallow to put foam in or it is no longer draining.

## 2019-01-25 DIAGNOSIS — E441 Mild protein-calorie malnutrition: Secondary | ICD-10-CM | POA: Insufficient documentation

## 2019-01-26 DIAGNOSIS — S41102A Unspecified open wound of left upper arm, initial encounter: Secondary | ICD-10-CM | POA: Diagnosis not present

## 2019-01-26 DIAGNOSIS — D509 Iron deficiency anemia, unspecified: Secondary | ICD-10-CM | POA: Diagnosis not present

## 2019-01-26 DIAGNOSIS — S51832A Puncture wound without foreign body of left forearm, initial encounter: Secondary | ICD-10-CM | POA: Diagnosis not present

## 2019-01-26 DIAGNOSIS — N186 End stage renal disease: Secondary | ICD-10-CM | POA: Diagnosis not present

## 2019-01-26 DIAGNOSIS — N2581 Secondary hyperparathyroidism of renal origin: Secondary | ICD-10-CM | POA: Diagnosis not present

## 2019-01-26 DIAGNOSIS — E1129 Type 2 diabetes mellitus with other diabetic kidney complication: Secondary | ICD-10-CM | POA: Diagnosis not present

## 2019-01-26 DIAGNOSIS — D631 Anemia in chronic kidney disease: Secondary | ICD-10-CM | POA: Diagnosis not present

## 2019-01-27 ENCOUNTER — Encounter (INDEPENDENT_AMBULATORY_CARE_PROVIDER_SITE_OTHER): Payer: Self-pay

## 2019-01-27 DIAGNOSIS — I2510 Atherosclerotic heart disease of native coronary artery without angina pectoris: Secondary | ICD-10-CM | POA: Diagnosis not present

## 2019-01-27 DIAGNOSIS — S51832A Puncture wound without foreign body of left forearm, initial encounter: Secondary | ICD-10-CM | POA: Diagnosis not present

## 2019-01-27 DIAGNOSIS — I5042 Chronic combined systolic (congestive) and diastolic (congestive) heart failure: Secondary | ICD-10-CM | POA: Diagnosis not present

## 2019-01-27 DIAGNOSIS — T8131XA Disruption of external operation (surgical) wound, not elsewhere classified, initial encounter: Secondary | ICD-10-CM | POA: Diagnosis not present

## 2019-01-27 DIAGNOSIS — N2581 Secondary hyperparathyroidism of renal origin: Secondary | ICD-10-CM | POA: Diagnosis not present

## 2019-01-27 DIAGNOSIS — S41102A Unspecified open wound of left upper arm, initial encounter: Secondary | ICD-10-CM | POA: Diagnosis not present

## 2019-01-27 DIAGNOSIS — E1122 Type 2 diabetes mellitus with diabetic chronic kidney disease: Secondary | ICD-10-CM | POA: Diagnosis not present

## 2019-01-27 DIAGNOSIS — D631 Anemia in chronic kidney disease: Secondary | ICD-10-CM | POA: Diagnosis not present

## 2019-01-27 DIAGNOSIS — D509 Iron deficiency anemia, unspecified: Secondary | ICD-10-CM | POA: Diagnosis not present

## 2019-01-27 DIAGNOSIS — I132 Hypertensive heart and chronic kidney disease with heart failure and with stage 5 chronic kidney disease, or end stage renal disease: Secondary | ICD-10-CM | POA: Diagnosis not present

## 2019-01-27 DIAGNOSIS — N186 End stage renal disease: Secondary | ICD-10-CM | POA: Diagnosis not present

## 2019-01-31 DIAGNOSIS — N2581 Secondary hyperparathyroidism of renal origin: Secondary | ICD-10-CM | POA: Diagnosis not present

## 2019-01-31 DIAGNOSIS — D631 Anemia in chronic kidney disease: Secondary | ICD-10-CM | POA: Diagnosis not present

## 2019-01-31 DIAGNOSIS — S41102A Unspecified open wound of left upper arm, initial encounter: Secondary | ICD-10-CM | POA: Diagnosis not present

## 2019-01-31 DIAGNOSIS — E1122 Type 2 diabetes mellitus with diabetic chronic kidney disease: Secondary | ICD-10-CM | POA: Diagnosis not present

## 2019-01-31 DIAGNOSIS — D509 Iron deficiency anemia, unspecified: Secondary | ICD-10-CM | POA: Diagnosis not present

## 2019-01-31 DIAGNOSIS — N186 End stage renal disease: Secondary | ICD-10-CM | POA: Diagnosis not present

## 2019-01-31 DIAGNOSIS — T8131XA Disruption of external operation (surgical) wound, not elsewhere classified, initial encounter: Secondary | ICD-10-CM | POA: Diagnosis not present

## 2019-01-31 DIAGNOSIS — S51832A Puncture wound without foreign body of left forearm, initial encounter: Secondary | ICD-10-CM | POA: Diagnosis not present

## 2019-01-31 DIAGNOSIS — I2510 Atherosclerotic heart disease of native coronary artery without angina pectoris: Secondary | ICD-10-CM | POA: Diagnosis not present

## 2019-01-31 DIAGNOSIS — I132 Hypertensive heart and chronic kidney disease with heart failure and with stage 5 chronic kidney disease, or end stage renal disease: Secondary | ICD-10-CM | POA: Diagnosis not present

## 2019-01-31 DIAGNOSIS — I5042 Chronic combined systolic (congestive) and diastolic (congestive) heart failure: Secondary | ICD-10-CM | POA: Diagnosis not present

## 2019-02-02 ENCOUNTER — Other Ambulatory Visit (INDEPENDENT_AMBULATORY_CARE_PROVIDER_SITE_OTHER): Payer: Self-pay | Admitting: Vascular Surgery

## 2019-02-02 ENCOUNTER — Telehealth: Payer: Self-pay | Admitting: Physician Assistant

## 2019-02-02 ENCOUNTER — Telehealth: Payer: Self-pay | Admitting: *Deleted

## 2019-02-02 ENCOUNTER — Encounter (INDEPENDENT_AMBULATORY_CARE_PROVIDER_SITE_OTHER): Payer: Self-pay

## 2019-02-02 DIAGNOSIS — S41102A Unspecified open wound of left upper arm, initial encounter: Secondary | ICD-10-CM | POA: Diagnosis not present

## 2019-02-02 DIAGNOSIS — S51832A Puncture wound without foreign body of left forearm, initial encounter: Secondary | ICD-10-CM | POA: Diagnosis not present

## 2019-02-02 DIAGNOSIS — N186 End stage renal disease: Secondary | ICD-10-CM | POA: Diagnosis not present

## 2019-02-02 DIAGNOSIS — D631 Anemia in chronic kidney disease: Secondary | ICD-10-CM | POA: Diagnosis not present

## 2019-02-02 DIAGNOSIS — N2581 Secondary hyperparathyroidism of renal origin: Secondary | ICD-10-CM | POA: Diagnosis not present

## 2019-02-02 DIAGNOSIS — D509 Iron deficiency anemia, unspecified: Secondary | ICD-10-CM | POA: Diagnosis not present

## 2019-02-02 MED ORDER — OXYCODONE HCL 5 MG PO TABS: 5.0000 mg | ORAL_TABLET | Freq: Four times a day (QID) | ORAL | 0 refills | Status: AC | PRN

## 2019-02-02 NOTE — Telephone Encounter (Signed)
Called pt re: appt 02/03/2019 at 12:00.  Left a detailed message that time has been changed to 11:30, due to 12 being providers lunch.  If this is not good for pt, I asked pt to call back.

## 2019-02-02 NOTE — Progress Notes (Signed)
The patient continues to struggle with her VAC.  Her wound is of significant size.  I will continue to prescribe narcotics however at this time I will decrease the dose.  She will continue to follow-up in the office regarding her wound.

## 2019-02-02 NOTE — Progress Notes (Signed)
Cardiology Office Note    Date:  02/03/2019   ID:  [name], DOB 1955-12-29, MRN [id number]  PCP:  [name], PA-C  Cardiologist:  Dr. Burt Knack  Chief Complaint: 6 months follow up  History of Present Illness:   [name] is a 63 y.o. female with hx of CAD, mild AS, chronic diastolic CHF, ESRD (T Th Sat), HTN, HLD and  DM seen for follow up.   Hx of NSTEMI 09/2016. Echocardiogram showed ejection fraction 45-50% with septal and apical hypokinesis. Cath with  95% proximal LAD and she had PCI with a Synergy drug-eluting stent. Echocardiogram August 2018 showed normal LV function (improved), grade 1 diastolic dysfunction, mild aortic stenosis with mean gradient 13 mmHg, mild aortic insufficiency and mild left atrial enlargement. She had repeat cardiac catheterization October 2018 because of recurring chest pain. She was found to have a widely patent mid LAD stent. There was mild to moderate disease in her circumflex, obtuse marginal and diagonal. Left ventricular filling pressure was normal. Recommended medical therapy.  Prior admission for CHF exacerbation 2nd to non compliance with diuretics.   Last seen by Cecilie Kicks 07/2018 for surgical clearance. Last echo 07/2018 showed LVEF of 60-65%, moderate LVH, grade 1 DD, mild to moderate AS.   L arm fistula placement recently >> complicated >Excisional debridement left upper arm wound and placement of a VAC wound dressing 12/2018.    Here today for follow up.  No regular exercise but active doing household stuff.  She denies chest pain, shortness of breath, orthopnea, PND, syncope, lower extremity edema or melena.  She still makes urine and uses torsemide with dialysis.  Compliant with medication.  Past Medical History:  Diagnosis Date   Anemia    Aortic stenosis    Echo 8/18: mean 13, peak 28, LVOT/AV mean velocity 0.51   Arthritis    Asthma    As a child    Bronchitis    CAD (coronary artery disease)    a.  09/2016: 50% Ost 1st Mrg stenosis, 50% 2nd Mrg stenosis, 20% Mid-Cx, 95% Prox LAD, 40% mid-LAD, and 10% dist-LAD stenosis. Staged PCI with DES to Prox-LAD.    Chronic combined systolic and diastolic CHF (congestive heart failure) ([place]) 2011   echo 2/18: EF 55-60, normal wall motion, grade 2 diastolic dysfunction, trivial AI // echo 3/18: Septal and apical HK, EF 45-50, normal wall motion, trivial AI, mild LAE, PASP 38 // echo 8/18: EF 60-65, normal wall motion, grade 1 diastolic dysfunction, calcified aortic valve leaflets, mild aortic stenosis (mean 13, peak 28, LVOT/AV mean velocity 0.51), mild AI, moderate MAC, mild LAE, trivial TR    Chronic kidney disease    STAGE 4   Chronic kidney disease on chronic dialysis (HCC)    t, th, sat   Complication of anesthesia    Depression    Diabetes mellitus Dx 1989   Elevated lipids    GERD (gastroesophageal reflux disease)    Gout    Heart murmur    asymptomatic   Hepatitis C Dx 2013   Hypertension Dx 1989   Infected surgical wound    Lt arm   Myocardial infarction ([place]) 07/2015   Obesity    Pancreatitis 2013   Pneumonia    Refusal of blood transfusions as patient is Jehovah's Witness    Tendinitis    Tremors of nervous system    LEFT HAND   Ulcer 2010    Past Surgical History:  Procedure  Laterality Date   APPLICATION OF WOUND VAC Left 08/14/2017   Procedure: APPLICATION OF WOUND VAC Exchange;  Surgeon: Robert Bellow, MD;  Location: [facility];  Service: General;  Laterality: Left;   APPLICATION OF WOUND VAC Left 12/21/2018   Procedure: APPLICATION OF WOUND VAC;  Surgeon: Katha Cabal, MD;  Location: [facility];  Service: Vascular;  Laterality: Left;   AV FISTULA PLACEMENT Left 08/19/2018   Procedure: ARTERIOVENOUS (AV) FISTULA CREATION ( BRACHIOBASILIC );  Surgeon: Katha Cabal, MD;  Location: [facility];  Service: Vascular;  Laterality: Left;   BASCILIC VEIN TRANSPOSITION Left 11/18/2018   Procedure:  BASCILIC VEIN TRANSPOSITION;  Surgeon: Katha Cabal, MD;  Location: [facility];  Service: Vascular;  Laterality: Left;   CHOLECYSTECTOMY     COLONOSCOPY WITH PROPOFOL N/A 02/03/2018   Procedure: COLONOSCOPY WITH PROPOFOL;  Surgeon: Lin Landsman, MD;  Location: [facility];  Service: Gastroenterology;  Laterality: N/A;   CORONARY ANGIOPLASTY  07/2015   STENT   CORONARY STENT INTERVENTION N/A 09/18/2016   Procedure: Coronary Stent Intervention;  Surgeon: Troy Sine, MD;  Location: [place] [facility];  Service: Cardiovascular;  Laterality: N/A;   DIALYSIS/PERMA CATHETER INSERTION N/A 05/10/2018   Procedure: DIALYSIS/PERMA CATHETER INSERTION;  Surgeon: Katha Cabal, MD;  Location: [place] [facility];  Service: Cardiovascular;  Laterality: N/A;   DRESSING CHANGE UNDER ANESTHESIA Left 08/15/2017   Procedure: exploration of wound for bleeding;  Surgeon: Robert Bellow, MD;  Location: [facility];  Service: General;  Laterality: Left;   ESOPHAGOGASTRODUODENOSCOPY (EGD) WITH PROPOFOL N/A 02/03/2018   Procedure: ESOPHAGOGASTRODUODENOSCOPY (EGD) WITH PROPOFOL;  Surgeon: Lin Landsman, MD;  Location: [facility];  Service: Gastroenterology;  Laterality: N/A;   EYE SURGERY  11/17/2018   INCISION AND DRAINAGE ABSCESS Left 08/12/2017   Procedure: INCISION AND DRAINAGE ABSCESS;  Surgeon: Robert Bellow, MD;  Location: [facility];  Service: General;  Laterality: Left;   KNEE ARTHROSCOPY     LEFT HEART CATH N/A 09/18/2016   Procedure: Left Heart Cath;  Surgeon: Troy Sine, MD;  Location: [place] [facility];  Service: Cardiovascular;  Laterality: N/A;   LEFT HEART CATH AND CORONARY ANGIOGRAPHY N/A 09/16/2016   Procedure: Left Heart Cath and Coronary Angiography;  Surgeon: Burnell Blanks, MD;  Location: [place] [facility];  Service: Cardiovascular;  Laterality: N/A;   LEFT HEART CATH AND CORONARY ANGIOGRAPHY N/A 04/29/2017   Procedure: LEFT HEART CATH AND  CORONARY ANGIOGRAPHY;  Surgeon: Nelva Bush, MD;  Location: [place] [facility];  Service: Cardiovascular;  Laterality: N/A;   LOWER EXTREMITY ANGIOGRAPHY Right 03/08/2018   Procedure: LOWER EXTREMITY ANGIOGRAPHY;  Surgeon: Katha Cabal, MD;  Location: [place] [facility];  Service: Cardiovascular;  Laterality: Right;   TUBAL LIGATION     TUBAL LIGATION     WOUND DEBRIDEMENT Left 12/21/2018   Procedure: DEBRIDEMENT WOUND;  Surgeon: Katha Cabal, MD;  Location: [facility];  Service: Vascular;  Laterality: Left;   WOUND DEBRIDEMENT Left 12/30/2018   Procedure: DEBRIDEMENT WOUND WITH VAC PLACEMENT (LEFT UPPER EXTREMITY);  Surgeon: Katha Cabal, MD;  Location: [facility];  Service: Vascular;  Laterality: Left;    Current Medications: Prior to Admission medications   Medication Sig Start Date End Date Taking? Authorizing Provider  Accu-Chek FastClix Lancets MISC USE AS DIRECTED TO CHECK BLOOD SUGAR THREE TIMES DAILY AND AT BEDTIME 09/28/18   [name], PA-C  albuterol [facility] HFA) [phone] Base) MCG/ACT  inhaler INHALE 2 PUFFS BY MOUTH EVERY 4 HOURS AS NEEDED FOR SHORTNESS OF BREATH Patient taking differently: Inhale 2 puffs into the lungs every 4 (four) hours as needed for wheezing or shortness of breath. INHALE 2 PUFFS BY MOUTH EVERY 4 HOURS AS NEEDED FOR SHORTNESS OF BREATH 06/24/18   [name], PA-C  allopurinol (ZYLOPRIM) 100 MG tablet Take 1 tablet (100 mg total) by mouth every morning. 12/16/18   [name], PA-C  aspirin EC 81 MG EC tablet Take 1 tablet (81 mg total) by mouth daily. 09/19/16   Strader, Fransisco Hertz, PA-C  atorvastatin (LIPITOR) 80 MG tablet TAKE 1 TABLET BY MOUTH ONCE DAILY AT 6PM Patient taking differently: Take 80 mg by mouth daily.  08/23/18   [name], PA-C  AURYXIA 1 GM 210 MG(Fe) tablet Take 420 mg by mouth 2 (two) times daily with a meal.  10/10/18   [provider]  carvedilol (COREG) 25 MG tablet TAKE  (1) TABLET BY MOUTH TWICE A DAY WITH MEALS (BREAKFAST AND SUPPER) Patient taking differently: Take 25 mg by mouth 2 (two) times daily with a meal.  08/23/18   Burnette, Clearnce Sorrel, PA-C  docusate sodium (COLACE) 100 MG capsule Take 1 capsule (100 mg total) by mouth 2 (two) times daily as needed for mild constipation. 09/20/17   Gouru, Illene Silver, MD  fluticasone (FLONASE) 50 MCG/ACT nasal spray Place 2 sprays into both nostrils daily as needed for allergies or rhinitis. 09/28/17   [name], PA-C  hydrALAZINE (APRESOLINE) 100 MG tablet TAKE (1) TABLET BY MOUTH THREE TIMES DAILY 01/19/19   [name], PA-C  hydrOXYzine (ATARAX/VISTARIL) 25 MG tablet Take 1 tablet (25 mg total) by mouth every 6 (six) hours as needed for itching. 12/07/18   [name], PA-C  insulin glargine (LANTUS) 100 unit/mL SOPN Inject 0.6 mLs (60 Units total) into the skin daily for 30 days. 01/02/19 02/01/19  Mayo, Pete Pelt, MD  liraglutide (VICTOZA) 18 MG/3ML SOPN Start with 0.44m daily and increase by 0.623mper week until max dose of 1.8 mg dose achieved. Patient taking differently: Inject 0.6 mg into the skin daily. Start with 0.68m43maily and increase by 0.68mg77mr week until max dose of 1.8 mg dose achieved. 10/20/18   BurnMar Daring-C  loratadine (CLARITIN) 10 MG tablet TAKE 1 TABLET BY MOUTH ONCE DAILY. Patient taking differently: Take 10 mg by mouth at bedtime.  09/20/18   BurnMar Daring-C  metroNIDAZOLE (FLAGYL) 500 MG tablet Take 1 tablet (500 mg total) by mouth 3 (three) times daily. 01/02/19   Mayo, KatyPete Pelt  mometasone (ELOCON) 0.1 % cream APPLY AS DIRECTED TO AFFECTED AREA ONCE DAILY. 12/08/18   BurnMar Daring-C  mometasone-formoterol (DULERA) 200-5 MCG/ACT AERO Inhale 2 puffs into the lungs 2 (two) times daily. Patient taking differently: Inhale 2 puffs into the lungs 2 (two) times daily as needed for wheezing or shortness of breath.  08/03/18   BurnMar Daring-C   montelukast (SINGULAIR) 10 MG tablet TAKE ONE TABLET BY MOUTH AT BEDTIME. 01/19/19   Burnette, JennClearnce Sorrel-C  nitroGLYCERIN (NITROSTAT) 0.4 MG SL tablet Place 1 tablet (0.4 mg total) under the tongue every 5 (five) minutes x 3 doses as needed for chest pain. 03/19/17   Barrett, Rhonda G, PA-C  NOVOLOG FLEXPEN 100 UNIT/ML FlexPen INJECT 25 UNITS S.Q. THREE TIMES A DAY WITH MEALS. Patient taking differently: Inject 30 Units into the skin  3 (three) times daily with meals.  10/20/18   [name], PA-C  omeprazole (PRILOSEC) 20 MG capsule TAKE (1) CAPSULE BY MOUTH ONCE DAILY. Patient taking differently: Take 20 mg by mouth daily.  09/20/18   [name], PA-C  oxyCODONE (ROXICODONE) 5 MG immediate release tablet Take 1-2 tablets (5-10 mg total) by mouth every 6 (six) hours as needed for moderate pain or severe pain. 01/09/19 01/09/20  Schnier, Dolores Lory, MD  pregabalin (LYRICA) 75 MG capsule Take 1 capsule (75 mg total) by mouth 2 (two) times daily. 01/19/19   Burnette, Jennifer M, PA-C  SENNA PLUS 8.6-50 MG tablet TAKE ONE TABLET BY MOUTH AT BEDTIME. Patient taking differently: Take 1 tablet by mouth at bedtime.  09/20/18   [name], PA-C  silver sulfADIAZINE (SILVADENE) 1 % cream Apply 1 application topically daily. Patient taking differently: Apply 1 application topically daily as needed (sore).  09/09/18   Kris Hartmann, NP  torsemide (DEMADEX) 20 MG tablet TAKE (2) TABLETS BY MOUTH TWICE DAILY. 01/19/19   [name], PA-C  traMADol (ULTRAM) 50 MG tablet One to Two Tabs Every Six Hours as Needed For Pain Patient taking differently: Take 50-100 mg by mouth every 6 (six) hours as needed (pain). One to Two Tabs Every Six Hours as Needed For Pain 08/30/18   Stegmayer, Joelene Millin A, PA-C  triamcinolone cream (KENALOG) 0.1 % APPLY TO AFFECTED AREA TWICE DAILY Patient taking differently: Apply 1 application topically daily as needed (itching).  06/17/18   [name],  PA-C  valACYclovir (VALTREX) 500 MG tablet TAKE (1) TABLET BY MOUTH EVERY OTHER DAY. Patient taking differently: Take 500 mg by mouth every other day.  11/18/18   [name], PA-C  Vitamin D, Ergocalciferol, (DRISDOL) 50000 units CAPS capsule TAKE 1 CAPSULE BY MOUTH ONCE A MONTH Patient taking differently: Take 50,000 Units by mouth every 30 (thirty) days.  03/08/18   [name], PA-C  gabapentin (NEURONTIN) 100 MG capsule Take 1 capsule (100 mg total) by mouth 3 (three) times daily. 08/19/17 09/01/17  [name], MD    Allergies:   Shellfish allergy and Diazepam   Social History   Socioeconomic History   Marital status: Divorced    Spouse name: Not on file   Number of children: Not on file   Years of education: Not on file   Highest education level: Not on file  Occupational History   Not on file  Social Needs   Financial resource strain: Not on file   Food insecurity    Worry: Not on file    Inability: Not on file   Transportation needs    Medical: Not on file    Non-medical: Not on file  Tobacco Use   Smoking status: Former Smoker    Packs/day: 0.25    Years: 6.00    Pack years: 1.50    Types: Cigarettes    Quit date: 10/25/1980    Years since quitting: 38.3   Smokeless tobacco: Never Used  Substance and Sexual Activity   Alcohol use: Yes    Comment: rarely   Drug use: Not Currently    Types: "Crack" cocaine, Marijuana    Comment: 08/21/2016 "clean since 05/1998"   Sexual activity: Not on file    Comment: Not asked  Lifestyle   Physical activity    Days per week: Not on file    Minutes per session: Not on file   Stress: Not on file  Relationships   Social Herbalist on phone: Not on file    Gets together: Not on file    Attends religious service: Not on file    Active member of club or organization: Not on file    Attends meetings of clubs or organizations: Not on file    Relationship status: Not on file  Other Topics  Concern   Not on file  Social History Narrative   Not on file     Family History:  The patient's family history includes Colon cancer in her mother; Diabetes in her paternal grandmother; Heart attack in her maternal grandmother and another family member; Hypertension in her brother and sister.   ROS:   Please see the history of present illness.    ROS All other systems reviewed and are negative.   PHYSICAL EXAM:   VS:  BP 138/78    Pulse 85    Ht _0  (1.727 m)    Wt 288 lb 6.4 oz (130.8 kg)    SpO2 97%    BMI 43.85 kg/m    GEN: Well nourished, well developed, in no acute distress  HEENT: normal  Neck: no JVD, carotid bruits, or masses Cardiac: RRR; + systolic  murmurs, rubs, or gallops,no edema  Respiratory:  clear to auscultation bilaterally, normal work of breathing GI: soft, nontender, nondistended, + BS MS: Left arm and right upper chest fistula Skin: warm and dry, no rash Neuro:  Alert and Oriented x 3, Strength and sensation are intact Psych: euthymic mood, full affect  Wt Readings from Last 3 Encounters:  02/03/19 288 lb 6.4 oz (130.8 kg)  01/09/19 291 lb (132 kg)  12/29/18 294 lb 1.5 oz (133.4 kg)      Studies/Labs Reviewed:   EKG:  EKG is not ordered today.    Recent Labs: 12/28/2018: ALT 23; TSH 1.560 12/30/2018: Magnesium 1.8 01/02/2019: BUN 63; Creatinine, Ser 6.23; Hemoglobin 9.0; Platelets 201; Potassium 4.5; Sodium 135   Lipid Panel    Component Value Date/Time   CHOL 122 09/13/2016 1245   TRIG 168 (H) 09/13/2016 1245   HDL 36 (L) 09/13/2016 1245   CHOLHDL 3.4 09/13/2016 1245   VLDL 34 09/13/2016 1245   LDLCALC 52 09/13/2016 1245    Additional studies/ records that were reviewed today include:   Echocardiogram: 07/2018 ------------------------------------------------------------------- Study Conclusions  - Left ventricle: The cavity size was normal. Wall thickness was   increased in a pattern of moderate LVH. Systolic function was    normal. The estimated ejection fraction was in the range of 60%   to 65%. Doppler parameters are consistent with abnormal left   ventricular relaxation (grade 1 diastolic dysfunction). - Aortic valve: There was mild to moderate stenosis. There was   trivial regurgitation. Valve area (VTI): 2.07 cm^2. Valve area   (Vmax): 1.69 cm^2. Valve area (Vmean): 1.67 cm^2. - Mitral valve: Calcified annulus. - Left atrium: The atrium was mildly dilated. - Atrial septum: No defect or patent foramen ovale was identified.  Cardiac Catheterization:04/2017  Conclusions: 1. Widely patent mid LAD stent. 2. No significant change in mild to moderate LCx, OM, and diagonal disease compared with 09/2016. 3. Normal left ventricular filling pressure.  Recommendations: 1. Continue medical therapy and secondary prevention. I will restart isosorbide mononitrate at 90 mg daily with hope of weaning off NTG infusion later today. Gentle hydration given CKD and normal LVEDP. Hold furosemide today and reassess volume status and renal function tomorrow before restarting diuretic  therapy.     ASSESSMENT & PLAN:    1. CAD -No angina.  Continue aspirin, statin and beta-blocker.  2. Chronic diastolic CHF -Euvolemic.  Volume managed by dialysis as well as torsemide.  3. HTN -Blood pressure stable and well-controlled on current medication.  4. ESRD on HD -Recent left arm fistula complicated as above.  May enroll in kidney transplant.  5. HLD - Continue statin.  Check lipid panel.  Recent LFTs were normal.  6. Mild to Moderate AS - Last echo 07/2018  Aortic valve: There was mild to moderate stenosis. There was   trivial regurgitation. Valve area (VTI): 2.07 cm^2. Valve area   (Vmax): 1.69 cm^2. Valve area (Vmean): 1.67 cm^2.   Medication Adjustments/Labs and Tests Ordered: Current medicines are reviewed at length with the patient today.  Concerns regarding medicines are outlined above.  Medication changes,  Labs and Tests ordered today are listed in the Patient Instructions below. Patient Instructions  Medication Instructions:  Your physician recommends that you continue on your current medications as directed. Please refer to the Current Medication list given to you today.  If you need a refill on your cardiac medications before your next appointment, please call your pharmacy.   Lab work: IN NEXT 1-2 WEEKS:  FASTING LIPID  If you have labs (blood work) drawn today and your tests are completely normal, you will receive your results only by:  [place] (if you have MyChart) OR  A paper copy in the mail If you have any lab test that is abnormal or we need to change your treatment, we will call you to review the results.  Testing/Procedures: None ordered  Follow-Up: At [facility], you and your health needs are our priority.  As part of our continuing mission to provide you with exceptional heart care, we have created designated Provider Care Teams.  These Care Teams include your primary Cardiologist (physician) and Advanced Practice Providers (APPs -  Physician Assistants and Nurse Practitioners) who all work together to provide you with the care you need, when you need it. You will need a follow up appointment in:  6 months.  Please call our office 2 months in advance to schedule this appointment.  You may see Sherren Mocha, MD or one of the following Advanced Practice Providers on your designated Care Team: Richardson Dopp, PA-C [place], [place]  Daune Perch, NP  Any Other Special Instructions Will Be Listed Below (If Applicable).       Jarrett Soho, [place]  02/03/2019 12:02 PM    [address], [place], [place]  [address] Phone: [phone]; Fax: [phone]

## 2019-02-02 NOTE — Telephone Encounter (Signed)
New Message ° ° ° °Left message to confirm appt and answer covid questions  °

## 2019-02-03 ENCOUNTER — Other Ambulatory Visit: Payer: Self-pay

## 2019-02-03 ENCOUNTER — Encounter: Payer: Self-pay | Admitting: Physician Assistant

## 2019-02-03 ENCOUNTER — Ambulatory Visit (INDEPENDENT_AMBULATORY_CARE_PROVIDER_SITE_OTHER): Payer: Medicare Other | Admitting: Physician Assistant

## 2019-02-03 VITALS — BP 138/78 | HR 85 | Ht 68.0 in | Wt 288.4 lb

## 2019-02-03 DIAGNOSIS — N186 End stage renal disease: Secondary | ICD-10-CM | POA: Diagnosis not present

## 2019-02-03 DIAGNOSIS — I5032 Chronic diastolic (congestive) heart failure: Secondary | ICD-10-CM

## 2019-02-03 DIAGNOSIS — I350 Nonrheumatic aortic (valve) stenosis: Secondary | ICD-10-CM | POA: Diagnosis not present

## 2019-02-03 DIAGNOSIS — Z79899 Other long term (current) drug therapy: Secondary | ICD-10-CM

## 2019-02-03 DIAGNOSIS — I2510 Atherosclerotic heart disease of native coronary artery without angina pectoris: Secondary | ICD-10-CM | POA: Diagnosis not present

## 2019-02-03 DIAGNOSIS — E1122 Type 2 diabetes mellitus with diabetic chronic kidney disease: Secondary | ICD-10-CM | POA: Diagnosis not present

## 2019-02-03 DIAGNOSIS — I5042 Chronic combined systolic (congestive) and diastolic (congestive) heart failure: Secondary | ICD-10-CM | POA: Diagnosis not present

## 2019-02-03 DIAGNOSIS — T8131XA Disruption of external operation (surgical) wound, not elsewhere classified, initial encounter: Secondary | ICD-10-CM | POA: Diagnosis not present

## 2019-02-03 DIAGNOSIS — Z7982 Long term (current) use of aspirin: Secondary | ICD-10-CM | POA: Diagnosis not present

## 2019-02-03 DIAGNOSIS — E785 Hyperlipidemia, unspecified: Secondary | ICD-10-CM | POA: Diagnosis not present

## 2019-02-03 DIAGNOSIS — Z794 Long term (current) use of insulin: Secondary | ICD-10-CM | POA: Diagnosis not present

## 2019-02-03 DIAGNOSIS — Z992 Dependence on renal dialysis: Secondary | ICD-10-CM

## 2019-02-03 DIAGNOSIS — I132 Hypertensive heart and chronic kidney disease with heart failure and with stage 5 chronic kidney disease, or end stage renal disease: Secondary | ICD-10-CM | POA: Diagnosis not present

## 2019-02-03 DIAGNOSIS — Z955 Presence of coronary angioplasty implant and graft: Secondary | ICD-10-CM | POA: Diagnosis not present

## 2019-02-03 MED ORDER — NITROGLYCERIN 0.4 MG SL SUBL: 0.4000 mg | SUBLINGUAL_TABLET | SUBLINGUAL | 12 refills | Status: DC | PRN

## 2019-02-03 NOTE — Patient Instructions (Signed)
Medication Instructions:  Your physician recommends that you continue on your current medications as directed. Please refer to the Current Medication list given to you today.  If you need a refill on your cardiac medications before your next appointment, please call your pharmacy.   Lab work: IN NEXT 1-2 WEEKS:  FASTING LIPID  If you have labs (blood work) drawn today and your tests are completely normal, you will receive your results only by: [name] MyChart Message (if you have MyChart) OR . A paper copy in the mail If you have any lab test that is abnormal or we need to change your treatment, we will call you to review the results.  Testing/Procedures: None ordered  Follow-Up: At [facility], you and your health needs are our priority.  As part of our continuing mission to provide you with exceptional heart care, we have created designated Provider Care Teams.  These Care Teams include your primary Cardiologist (physician) and Advanced Practice Providers (APPs -  Physician Assistants and Nurse Practitioners) who all work together to provide you with the care you need, when you need it. You will need a follow up appointment in:  6 months.  Please call our office 2 months in advance to schedule this appointment.  You may see Sherren Mocha, MD or one of the following Advanced Practice Providers on your designated Care Team: Richardson Dopp, PA-C [place], [place] . Daune Perch, NP  Any Other Special Instructions Will Be Listed Below (If Applicable).

## 2019-02-04 DIAGNOSIS — S41102A Unspecified open wound of left upper arm, initial encounter: Secondary | ICD-10-CM | POA: Diagnosis not present

## 2019-02-04 DIAGNOSIS — N2581 Secondary hyperparathyroidism of renal origin: Secondary | ICD-10-CM | POA: Diagnosis not present

## 2019-02-04 DIAGNOSIS — D631 Anemia in chronic kidney disease: Secondary | ICD-10-CM | POA: Diagnosis not present

## 2019-02-04 DIAGNOSIS — S51832A Puncture wound without foreign body of left forearm, initial encounter: Secondary | ICD-10-CM | POA: Diagnosis not present

## 2019-02-04 DIAGNOSIS — N186 End stage renal disease: Secondary | ICD-10-CM | POA: Diagnosis not present

## 2019-02-04 DIAGNOSIS — D509 Iron deficiency anemia, unspecified: Secondary | ICD-10-CM | POA: Diagnosis not present

## 2019-02-06 ENCOUNTER — Telehealth (INDEPENDENT_AMBULATORY_CARE_PROVIDER_SITE_OTHER): Payer: Self-pay

## 2019-02-06 NOTE — Telephone Encounter (Addendum)
Home health nurse had called stating the patient left arm wound is now superficial with no drainage.Patient also removes wound vac before home health visit.The home health nurse is requesting to discontinue wound vac but will continue weekly home health visits.At this time the patient is doing a wet to dry on wound.I spoke with Dr Delana Meyer is recommend for the home health nurse to apply Aquacel Ag.I left a message on the nurse voicemail with information

## 2019-02-07 DIAGNOSIS — E1122 Type 2 diabetes mellitus with diabetic chronic kidney disease: Secondary | ICD-10-CM | POA: Diagnosis not present

## 2019-02-07 DIAGNOSIS — I132 Hypertensive heart and chronic kidney disease with heart failure and with stage 5 chronic kidney disease, or end stage renal disease: Secondary | ICD-10-CM | POA: Diagnosis not present

## 2019-02-07 DIAGNOSIS — I2510 Atherosclerotic heart disease of native coronary artery without angina pectoris: Secondary | ICD-10-CM | POA: Diagnosis not present

## 2019-02-07 DIAGNOSIS — T8131XA Disruption of external operation (surgical) wound, not elsewhere classified, initial encounter: Secondary | ICD-10-CM | POA: Diagnosis not present

## 2019-02-07 DIAGNOSIS — D509 Iron deficiency anemia, unspecified: Secondary | ICD-10-CM | POA: Diagnosis not present

## 2019-02-07 DIAGNOSIS — N2581 Secondary hyperparathyroidism of renal origin: Secondary | ICD-10-CM | POA: Diagnosis not present

## 2019-02-07 DIAGNOSIS — S51832A Puncture wound without foreign body of left forearm, initial encounter: Secondary | ICD-10-CM | POA: Diagnosis not present

## 2019-02-07 DIAGNOSIS — N186 End stage renal disease: Secondary | ICD-10-CM | POA: Diagnosis not present

## 2019-02-07 DIAGNOSIS — S41102A Unspecified open wound of left upper arm, initial encounter: Secondary | ICD-10-CM | POA: Diagnosis not present

## 2019-02-07 DIAGNOSIS — D631 Anemia in chronic kidney disease: Secondary | ICD-10-CM | POA: Diagnosis not present

## 2019-02-07 DIAGNOSIS — I5042 Chronic combined systolic (congestive) and diastolic (congestive) heart failure: Secondary | ICD-10-CM | POA: Diagnosis not present

## 2019-02-08 ENCOUNTER — Telehealth: Payer: Self-pay | Admitting: Physician Assistant

## 2019-02-08 DIAGNOSIS — I5042 Chronic combined systolic (congestive) and diastolic (congestive) heart failure: Secondary | ICD-10-CM | POA: Diagnosis not present

## 2019-02-08 DIAGNOSIS — I132 Hypertensive heart and chronic kidney disease with heart failure and with stage 5 chronic kidney disease, or end stage renal disease: Secondary | ICD-10-CM | POA: Diagnosis not present

## 2019-02-08 DIAGNOSIS — T8131XA Disruption of external operation (surgical) wound, not elsewhere classified, initial encounter: Secondary | ICD-10-CM | POA: Diagnosis not present

## 2019-02-08 DIAGNOSIS — I2510 Atherosclerotic heart disease of native coronary artery without angina pectoris: Secondary | ICD-10-CM | POA: Diagnosis not present

## 2019-02-08 DIAGNOSIS — E1122 Type 2 diabetes mellitus with diabetic chronic kidney disease: Secondary | ICD-10-CM | POA: Diagnosis not present

## 2019-02-08 DIAGNOSIS — N186 End stage renal disease: Secondary | ICD-10-CM | POA: Diagnosis not present

## 2019-02-09 ENCOUNTER — Ambulatory Visit (INDEPENDENT_AMBULATORY_CARE_PROVIDER_SITE_OTHER): Payer: Medicare Other | Admitting: Vascular Surgery

## 2019-02-09 DIAGNOSIS — D509 Iron deficiency anemia, unspecified: Secondary | ICD-10-CM | POA: Diagnosis not present

## 2019-02-09 DIAGNOSIS — N186 End stage renal disease: Secondary | ICD-10-CM | POA: Diagnosis not present

## 2019-02-09 DIAGNOSIS — S51832A Puncture wound without foreign body of left forearm, initial encounter: Secondary | ICD-10-CM | POA: Diagnosis not present

## 2019-02-09 DIAGNOSIS — N2581 Secondary hyperparathyroidism of renal origin: Secondary | ICD-10-CM | POA: Diagnosis not present

## 2019-02-09 DIAGNOSIS — D631 Anemia in chronic kidney disease: Secondary | ICD-10-CM | POA: Diagnosis not present

## 2019-02-09 DIAGNOSIS — S41102A Unspecified open wound of left upper arm, initial encounter: Secondary | ICD-10-CM | POA: Diagnosis not present

## 2019-02-11 DIAGNOSIS — N2581 Secondary hyperparathyroidism of renal origin: Secondary | ICD-10-CM | POA: Diagnosis not present

## 2019-02-11 DIAGNOSIS — E1122 Type 2 diabetes mellitus with diabetic chronic kidney disease: Secondary | ICD-10-CM | POA: Diagnosis not present

## 2019-02-11 DIAGNOSIS — N186 End stage renal disease: Secondary | ICD-10-CM | POA: Diagnosis not present

## 2019-02-11 DIAGNOSIS — D631 Anemia in chronic kidney disease: Secondary | ICD-10-CM | POA: Diagnosis not present

## 2019-02-11 DIAGNOSIS — Z992 Dependence on renal dialysis: Secondary | ICD-10-CM | POA: Diagnosis not present

## 2019-02-11 DIAGNOSIS — D509 Iron deficiency anemia, unspecified: Secondary | ICD-10-CM | POA: Diagnosis not present

## 2019-02-13 ENCOUNTER — Other Ambulatory Visit: Payer: Self-pay

## 2019-02-13 ENCOUNTER — Ambulatory Visit (INDEPENDENT_AMBULATORY_CARE_PROVIDER_SITE_OTHER): Payer: Medicare Other | Admitting: Physician Assistant

## 2019-02-13 ENCOUNTER — Encounter: Payer: Self-pay | Admitting: Physician Assistant

## 2019-02-13 VITALS — BP 140/80 | HR 74 | Temp 98.5°F | Resp 16 | Wt 290.0 lb

## 2019-02-13 DIAGNOSIS — Z992 Dependence on renal dialysis: Secondary | ICD-10-CM | POA: Diagnosis not present

## 2019-02-13 DIAGNOSIS — N186 End stage renal disease: Secondary | ICD-10-CM

## 2019-02-13 DIAGNOSIS — M62830 Muscle spasm of back: Secondary | ICD-10-CM | POA: Diagnosis not present

## 2019-02-13 DIAGNOSIS — I2510 Atherosclerotic heart disease of native coronary artery without angina pectoris: Secondary | ICD-10-CM

## 2019-02-13 DIAGNOSIS — B182 Chronic viral hepatitis C: Secondary | ICD-10-CM

## 2019-02-13 MED ORDER — TRAMADOL HCL 50 MG PO TABS: ORAL_TABLET | ORAL | 0 refills | Status: DC

## 2019-02-13 MED ORDER — CYCLOBENZAPRINE HCL 5 MG PO TABS: 5.0000 mg | ORAL_TABLET | Freq: Three times a day (TID) | ORAL | 1 refills | Status: DC | PRN

## 2019-02-13 NOTE — Patient Instructions (Signed)
Hepatitis C  Hepatitis C is a viral infection of the liver. It can lead to scarring of the liver (cirrhosis), liver failure, or liver cancer. Hepatitis C may go undetected for months or years because people with the infection may not have symptoms, or they may have only mild symptoms. What are the causes? This condition is caused by the hepatitis C virus (HCV). The virus can spread from person to person (is contagious) through:  Blood.  Childbirth. A woman who has hepatitis C can pass it to her baby during birth.  Bodily fluids, such as breast milk, tears, semen, vaginal fluids, and saliva.  Blood transfusions or organ transplants done in the [place] before 1992. What increases the risk? The following factors may make you more likely to develop this condition:  Having contact with unclean (contaminated) needles or syringes. This may result from: ? Acupuncture. ? Tattoing. ? Body piercing. ? Injecting drugs.  Having unprotected sex with someone who is infected.  Needing treatment to filter your blood (kidney dialysis).  Having HIV (human immunodeficiency virus) or AIDS (acquired immunodeficiency syndrome).  Working in a job that involves contact with blood or bodily fluids, such as health care. What are the signs or symptoms? Symptoms of this condition include:  Fatigue.  Loss of appetite.  Nausea.  Vomiting.  Abdominal pain.  Dark yellow urine.  Yellowish skin and eyes (jaundice).  Itchy skin.  Clay-colored bowel movements.  Joint pain.  Bleeding and bruising easily.  Fluid building up in your stomach (ascites). In some cases, you may not have any symptoms. How is this diagnosed? This condition is diagnosed with:  Blood tests.  Other tests to check how well your liver is functioning. They may include: ? Magnetic resonance elastography (MRE). This imaging test uses MRIs and sound waves to measure liver stiffness. ? Transient elastography. This  imaging test uses ultrasounds to measure liver stiffness. ? Liver biopsy. This test requires taking a small tissue sample from your liver to examine it under a microscope. How is this treated? Your health care provider may perform noninvasive tests or a liver biopsy to help decide the best course of treatment. Treatment may include:  Antiviral medicines and other medicines.  Follow-up treatments every 6-12 months for infections or other liver conditions.  Receiving a donated liver (liver transplant). Follow these instructions at home: Medicines  Take over-the-counter and prescription medicines only as told by your health care provider.  Take your antiviral medicine as told by your health care provider. Do not stop taking the antiviral even if you start to feel better.  Do not take any medicines unless approved by your health care provider, including over-the-counter medicines and birth control pills. Activity  Rest as needed.  Do not have sex unless approved by your health care provider.  Ask your health care provider when you may return to school or work. Eating and drinking   Eat a balanced diet with plenty of fruits and vegetables, whole grains, and lowfat (lean) meats or non-meat proteins (such as beans or tofu).  Drink enough fluids to keep your urine clear or pale yellow.  Do not drink alcohol. General instructions  Do not share toothbrushes, nail clippers, or razors.  Wash your hands frequently with soap and water. If soap and water are not available, use hand sanitizer.  Cover any cuts or open sores on your skin to prevent spreading the virus.  Keep all follow-up visits as told by your health care provider. This is  important. You may need follow-up visits every 6-12 months. How is this prevented? There is no vaccine for hepatitis C. The only way to prevent the disease is to reduce the risk of exposure to the virus. Make sure you:  Wash your hands frequently with  soap and water. If soap and water are not available, use hand sanitizer.  Do not share needles or syringes.  Practice safe sex and use condoms.  Avoid handling blood or bodily fluids without gloves or other protection.  Avoid getting tattoos or piercings in shops or other locations that are not clean. Contact a health care provider if:  You have a fever.  You develop abdominal pain.  You pass dark urine.  You pass clay-colored stools.  You develop joint pain. Get help right away if:  You have increasing fatigue or weakness.  You lose your appetite.  You cannot eat or drink without vomiting.  You develop jaundice or your jaundice gets worse.  You bruise or bleed easily. Summary  Hepatitis C is a viral infection of the liver. It can lead to scarring of the liver (cirrhosis), liver failure, or liver cancer.  The hepatitis C virus (HCV) causes this condition. The virus can pass from person to person (is contagious).  You should not take any medicines unless approved by your health care provider. This includes over-the-counter medicines and birth control pills. This information is not intended to replace advice given to you by your health care provider. Make sure you discuss any questions you have with your health care provider. Document Released: 06/26/2000 Document Revised: 06/11/2017 Document Reviewed: 08/04/2016 Elsevier Patient Education  2020 [employer].

## 2019-02-13 NOTE — Progress Notes (Signed)
Patient: [name] Female    DOB: 24-Oct-1955   63 y.o.   MRN: [id number] Visit Date: 02/13/2019  Today's Provider: [name], PA-C   No chief complaint on file.  Subjective:     HPI  Patient here today because she needs a referral for her Hep C. She has multiple chronic health issues including T2DM, ESRD on dialysis (from permcath currently), basilic AV fistula had post op dehiscence and has not been accessed. She reports taking classes for a kidney transplant and has been more active in wanting to take care of herself now. She is trying to eat better and walking daily. Recently able to walk 1 mile. She would like to finally follow up on Hep C. Patient has been referred multiple times as early as 2016, but has never followed through with referrals.   Allergies  Allergen Reactions  . Shellfish Allergy Anaphylaxis and Swelling  . Diazepam Other (See Comments)    "felt like out of body experience"     Current Outpatient Medications:  .  Accu-Chek FastClix Lancets MISC, USE AS DIRECTED TO CHECK BLOOD SUGAR THREE TIMES DAILY AND AT BEDTIME, Disp: 102 each, Rfl: 0 .  albuterol (PROAIR HFA) 108 (90 Base) MCG/ACT inhaler, INHALE 2 PUFFS BY MOUTH EVERY 4 HOURS AS NEEDED FOR SHORTNESS OF BREATH, Disp: 8.5 g, Rfl: 5 .  allopurinol (ZYLOPRIM) 100 MG tablet, Take 1 tablet (100 mg total) by mouth every morning., Disp: 90 tablet, Rfl: 1 .  aspirin EC 81 MG EC tablet, Take 1 tablet (81 mg total) by mouth daily., Disp: , Rfl:  .  atorvastatin (LIPITOR) 80 MG tablet, TAKE 1 TABLET BY MOUTH ONCE DAILY AT 6PM, Disp: 28 tablet, Rfl: 10 .  AURYXIA 1 GM 210 MG(Fe) tablet, Take 420 mg by mouth 2 (two) times daily with a meal. , Disp: , Rfl:  .  carvedilol (COREG) 25 MG tablet, TAKE (1) TABLET BY MOUTH TWICE A DAY WITH MEALS (BREAKFAST AND SUPPER), Disp: 56 tablet, Rfl: 10 .  docusate sodium (COLACE) 100 MG capsule, Take 1 capsule (100 mg total) by mouth 2 (two) times daily as needed for  mild constipation., Disp: 30 capsule, Rfl: 0 .  fluticasone (FLONASE) 50 MCG/ACT nasal spray, Place 2 sprays into both nostrils daily as needed for allergies or rhinitis., Disp: 48 g, Rfl: 1 .  hydrALAZINE (APRESOLINE) 100 MG tablet, TAKE (1) TABLET BY MOUTH THREE TIMES DAILY, Disp: 84 tablet, Rfl: 0 .  hydrOXYzine (ATARAX/VISTARIL) 25 MG tablet, Take 1 tablet (25 mg total) by mouth every 6 (six) hours as needed for itching., Disp: 120 tablet, Rfl: 10 .  LEVEMIR FLEXTOUCH 100 UNIT/ML Pen, Inject 60 Units into the skin 2 (two) times a day., Disp: , Rfl:  .  liraglutide (VICTOZA) 18 MG/3ML SOPN, Start with 0.6mg  daily and increase by 0.6mg  per week until max dose of 1.8 mg dose achieved., Disp: 9 mL, Rfl: 10 .  loratadine (CLARITIN) 10 MG tablet, TAKE 1 TABLET BY MOUTH ONCE DAILY., Disp: 28 tablet, Rfl: 10 .  mometasone (ELOCON) 0.1 % cream, APPLY AS DIRECTED TO AFFECTED AREA ONCE DAILY., Disp: 45 g, Rfl: 10 .  mometasone-formoterol (DULERA) 200-5 MCG/ACT AERO, Inhale 2 puffs into the lungs 2 (two) times daily., Disp: 13 g, Rfl: 5 .  montelukast (SINGULAIR) 10 MG tablet, TAKE ONE TABLET BY MOUTH AT BEDTIME., Disp: 28 tablet, Rfl: 0 .  nitroGLYCERIN (NITROSTAT) 0.4 MG SL tablet, Place 1 tablet (  0.4 mg total) under the tongue every 5 (five) minutes x 3 doses as needed for chest pain., Disp: 25 tablet, Rfl: 12 .  NOVOLOG FLEXPEN 100 UNIT/ML FlexPen, INJECT 25 UNITS S.Q. THREE TIMES A DAY WITH MEALS., Disp: 15 mL, Rfl: 10 .  omeprazole (PRILOSEC) 20 MG capsule, TAKE (1) CAPSULE BY MOUTH ONCE DAILY., Disp: 28 capsule, Rfl: 10 .  oxyCODONE (ROXICODONE) 5 MG immediate release tablet, Take 1 tablet (5 mg total) by mouth every 6 (six) hours as needed for moderate pain or severe pain., Disp: 50 tablet, Rfl: 0 .  pregabalin (LYRICA) 75 MG capsule, Take 1 capsule (75 mg total) by mouth 2 (two) times daily., Disp: 180 capsule, Rfl: 1 .  SENNA PLUS 8.6-50 MG tablet, TAKE ONE TABLET BY MOUTH AT BEDTIME., Disp: 28  tablet, Rfl: 10 .  silver sulfADIAZINE (SILVADENE) 1 % cream, Apply 1 application topically daily., Disp: 50 g, Rfl: 0 .  torsemide (DEMADEX) 20 MG tablet, TAKE (2) TABLETS BY MOUTH TWICE DAILY., Disp: 112 tablet, Rfl: 0 .  traMADol (ULTRAM) 50 MG tablet, One to Two Tabs Every Six Hours as Needed For Pain, Disp: 40 tablet, Rfl: 0 .  triamcinolone cream (KENALOG) 0.1 %, APPLY TO AFFECTED AREA TWICE DAILY, Disp: 80 g, Rfl: 0 .  valACYclovir (VALTREX) 500 MG tablet, TAKE (1) TABLET BY MOUTH EVERY OTHER DAY., Disp: 14 tablet, Rfl: 10 .  Vitamin D, Ergocalciferol, (DRISDOL) 50000 units CAPS capsule, TAKE 1 CAPSULE BY MOUTH ONCE A MONTH, Disp: 3 capsule, Rfl: 3  Review of Systems  Constitutional: Negative.   Respiratory: Negative.   Cardiovascular: Negative.   Musculoskeletal: Positive for back pain.  Skin: Positive for wound (left upper medial arm with dry dressing on).  Neurological: Negative.     Social History   Tobacco Use  . Smoking status: Former Smoker    Packs/day: 0.25    Years: 6.00    Pack years: 1.50    Types: Cigarettes    Quit date: 10/25/1980    Years since quitting: 38.3  . Smokeless tobacco: Never Used  Substance Use Topics  . Alcohol use: Yes    Comment: rarely      Objective:   BP 140/80 (BP Location: Right Wrist, Patient Position: Sitting, Cuff Size: Large)   Pulse 74   Temp 98.5 F (36.9 C) (Oral)   Resp 16   Wt 290 lb (131.5 kg)   BMI 44.09 kg/m  Vitals:   02/13/19 0905  BP: 140/80  Pulse: 74  Resp: 16  Temp: 98.5 F (36.9 C)  TempSrc: Oral  Weight: 290 lb (131.5 kg)     Physical Exam Vitals signs reviewed.  Constitutional:      General: She is not in acute distress.    Appearance: Normal appearance. She is well-developed. She is obese. She is not ill-appearing or diaphoretic.  HENT:     Head: Normocephalic and atraumatic.  Eyes:     General: No scleral icterus.    Extraocular Movements: Extraocular movements intact.      Conjunctiva/sclera: Conjunctivae normal.     Pupils: Pupils are equal, round, and reactive to light.  Neck:     Musculoskeletal: Normal range of motion and neck supple.  Cardiovascular:     Rate and Rhythm: Normal rate and regular rhythm.     Heart sounds: Normal heart sounds. No murmur. No friction rub. No gallop.   Pulmonary:     Effort: Pulmonary effort is normal. No respiratory distress.  Breath sounds: Normal breath sounds. No wheezing or rales.  Skin:    General: Skin is warm and dry.     Capillary Refill: Capillary refill takes less than 2 seconds.     Coloration: Skin is not jaundiced.       Neurological:     Mental Status: She is alert.      No results found for any visits on 02/13/19.     Assessment & Plan     1. Chronic hepatitis C without hepatic coma [facility]) Patient has been referred to ID and GI multiple times since 2016 but never followed through. New referral placed. Patient agrees to go as she is trying to be more proactive in her own health now in hopes for possible kidney transplant in the future. Currently on HD for ESRD.  - Ambulatory referral to Infectious Disease  2. Muscle spasm of back Aggravated currently from her increased exercise and also had a fall after dialysis on Saturday that aggravated it more. Will refill tramadol, not filled since February 2020. She has had oxycodone Rx from Dr. Delana Meyer, but last one was from 02/02/19 and was only a 9 day supply.  - cyclobenzaprine (FLEXERIL) 5 MG tablet; Take 1 tablet (5 mg total) by mouth 3 (three) times daily as needed for muscle spasms.  Dispense: 30 tablet; Refill: 1 - traMADol (ULTRAM) 50 MG tablet; One to Two Tabs Every Six Hours as Needed For Pain  Dispense: 40 tablet; Refill: 0  3. ESRD on dialysis ([place]) Goes T, Th, Sa. Using permcath currently.   4. Morbid (severe) obesity due to excess calories ([place]) Starting to eat healthier and exercise as tolerated.      [name], PA-C   [facility]

## 2019-02-14 DIAGNOSIS — D509 Iron deficiency anemia, unspecified: Secondary | ICD-10-CM | POA: Diagnosis not present

## 2019-02-14 DIAGNOSIS — Z992 Dependence on renal dialysis: Secondary | ICD-10-CM | POA: Diagnosis not present

## 2019-02-14 DIAGNOSIS — D631 Anemia in chronic kidney disease: Secondary | ICD-10-CM | POA: Diagnosis not present

## 2019-02-14 DIAGNOSIS — N186 End stage renal disease: Secondary | ICD-10-CM | POA: Diagnosis not present

## 2019-02-14 DIAGNOSIS — N2581 Secondary hyperparathyroidism of renal origin: Secondary | ICD-10-CM | POA: Diagnosis not present

## 2019-02-16 ENCOUNTER — Ambulatory Visit (INDEPENDENT_AMBULATORY_CARE_PROVIDER_SITE_OTHER): Payer: Medicare Other | Admitting: Vascular Surgery

## 2019-02-16 ENCOUNTER — Other Ambulatory Visit: Payer: Self-pay | Admitting: Physician Assistant

## 2019-02-16 ENCOUNTER — Encounter (INDEPENDENT_AMBULATORY_CARE_PROVIDER_SITE_OTHER): Payer: Self-pay | Admitting: Vascular Surgery

## 2019-02-16 ENCOUNTER — Other Ambulatory Visit: Payer: Self-pay

## 2019-02-16 VITALS — BP 177/74 | HR 84 | Resp 16 | Wt 288.8 lb

## 2019-02-16 DIAGNOSIS — D631 Anemia in chronic kidney disease: Secondary | ICD-10-CM | POA: Diagnosis not present

## 2019-02-16 DIAGNOSIS — E559 Vitamin D deficiency, unspecified: Secondary | ICD-10-CM

## 2019-02-16 DIAGNOSIS — N186 End stage renal disease: Secondary | ICD-10-CM

## 2019-02-16 DIAGNOSIS — D509 Iron deficiency anemia, unspecified: Secondary | ICD-10-CM | POA: Diagnosis not present

## 2019-02-16 DIAGNOSIS — Z992 Dependence on renal dialysis: Secondary | ICD-10-CM | POA: Diagnosis not present

## 2019-02-16 DIAGNOSIS — N2581 Secondary hyperparathyroidism of renal origin: Secondary | ICD-10-CM | POA: Diagnosis not present

## 2019-02-16 DIAGNOSIS — T829XXD Unspecified complication of cardiac and vascular prosthetic device, implant and graft, subsequent encounter: Secondary | ICD-10-CM

## 2019-02-16 NOTE — Telephone Encounter (Signed)
Does she still need this prescription?

## 2019-02-16 NOTE — Progress Notes (Signed)
Patient ID: [name], female   DOB: 18-Feb-1956, 63 y.o.   MRN: [id number]  No chief complaint on file.   HPI [name] is a 63 y.o. female.    Patient returns to the office for follow-up care.  She is status post creation of a left basilic transposition on 11/18/2018.  Her course has been complicated by wound dehiscence and wound infection she is status post several debridements.  She has been treated with a VAC.  Recently the [facility] is been discontinued because the wound is so superficial.   Past Medical History:  Diagnosis Date   Anemia    Aortic stenosis    Echo 8/18: mean 13, peak 28, LVOT/AV mean velocity 0.51   Arthritis    Asthma    As a child    Bronchitis    CAD (coronary artery disease)    a. 09/2016: 50% Ost 1st Mrg stenosis, 50% 2nd Mrg stenosis, 20% Mid-Cx, 95% Prox LAD, 40% mid-LAD, and 10% dist-LAD stenosis. Staged PCI with DES to Prox-LAD.    Chronic combined systolic and diastolic CHF (congestive heart failure) ([place]) 2011   echo 2/18: EF 55-60, normal wall motion, grade 2 diastolic dysfunction, trivial AI // echo 3/18: Septal and apical HK, EF 45-50, normal wall motion, trivial AI, mild LAE, PASP 38 // echo 8/18: EF 60-65, normal wall motion, grade 1 diastolic dysfunction, calcified aortic valve leaflets, mild aortic stenosis (mean 13, peak 28, LVOT/AV mean velocity 0.51), mild AI, moderate MAC, mild LAE, trivial TR    Chronic kidney disease    STAGE 4   Chronic kidney disease on chronic dialysis (HCC)    t, th, sat   Complication of anesthesia    Depression    Diabetes mellitus Dx 1989   Elevated lipids    GERD (gastroesophageal reflux disease)    Gout    Heart murmur    asymptomatic   Hepatitis C Dx 2013   Hypertension Dx 1989   Infected surgical wound    Lt arm   Myocardial infarction ([place]) 07/2015   Obesity    Pancreatitis 2013   Pneumonia    Refusal of blood transfusions as patient is Jehovah's Witness    Tendinitis      Tremors of nervous system    LEFT HAND   Ulcer 2010    Past Surgical History:  Procedure Laterality Date   APPLICATION OF WOUND VAC Left 08/14/2017   Procedure: APPLICATION OF WOUND VAC Exchange;  Surgeon: Robert Bellow, MD;  Location: [facility] ORS;  Service: General;  Laterality: Left;   APPLICATION OF WOUND VAC Left 12/21/2018   Procedure: APPLICATION OF WOUND VAC;  Surgeon: Katha Cabal, MD;  Location: [facility] ORS;  Service: Vascular;  Laterality: Left;   AV FISTULA PLACEMENT Left 08/19/2018   Procedure: ARTERIOVENOUS (AV) FISTULA CREATION ( BRACHIOBASILIC );  Surgeon: Katha Cabal, MD;  Location: [facility] ORS;  Service: Vascular;  Laterality: Left;   BASCILIC VEIN TRANSPOSITION Left 11/18/2018   Procedure: BASCILIC VEIN TRANSPOSITION;  Surgeon: Katha Cabal, MD;  Location: [facility] ORS;  Service: Vascular;  Laterality: Left;   CHOLECYSTECTOMY     COLONOSCOPY WITH PROPOFOL N/A 02/03/2018   Procedure: COLONOSCOPY WITH PROPOFOL;  Surgeon: Lin Landsman, MD;  Location: [facility] ENDOSCOPY;  Service: Gastroenterology;  Laterality: N/A;   CORONARY ANGIOPLASTY  07/2015   STENT   CORONARY STENT INTERVENTION N/A 09/18/2016   Procedure: Coronary Stent Intervention;  Surgeon: Troy Sine, MD;  Location: [place] [facility];  Service: Cardiovascular;  Laterality: N/A;   DIALYSIS/PERMA CATHETER INSERTION N/A 05/10/2018   Procedure: DIALYSIS/PERMA CATHETER INSERTION;  Surgeon: Katha Cabal, MD;  Location: [place] [facility];  Service: Cardiovascular;  Laterality: N/A;   DRESSING CHANGE UNDER ANESTHESIA Left 08/15/2017   Procedure: exploration of wound for bleeding;  Surgeon: Robert Bellow, MD;  Location: [facility] ORS;  Service: General;  Laterality: Left;   ESOPHAGOGASTRODUODENOSCOPY (EGD) WITH PROPOFOL N/A 02/03/2018   Procedure: ESOPHAGOGASTRODUODENOSCOPY (EGD) WITH PROPOFOL;  Surgeon: Lin Landsman, MD;  Location: [facility] ENDOSCOPY;  Service: Gastroenterology;   Laterality: N/A;   EYE SURGERY  11/17/2018   INCISION AND DRAINAGE ABSCESS Left 08/12/2017   Procedure: INCISION AND DRAINAGE ABSCESS;  Surgeon: Robert Bellow, MD;  Location: [facility] ORS;  Service: General;  Laterality: Left;   KNEE ARTHROSCOPY     LEFT HEART CATH N/A 09/18/2016   Procedure: Left Heart Cath;  Surgeon: Troy Sine, MD;  Location: [place] [facility];  Service: Cardiovascular;  Laterality: N/A;   LEFT HEART CATH AND CORONARY ANGIOGRAPHY N/A 09/16/2016   Procedure: Left Heart Cath and Coronary Angiography;  Surgeon: Burnell Blanks, MD;  Location: [place] [facility];  Service: Cardiovascular;  Laterality: N/A;   LEFT HEART CATH AND CORONARY ANGIOGRAPHY N/A 04/29/2017   Procedure: LEFT HEART CATH AND CORONARY ANGIOGRAPHY;  Surgeon: Nelva Bush, MD;  Location: [place] [facility];  Service: Cardiovascular;  Laterality: N/A;   LOWER EXTREMITY ANGIOGRAPHY Right 03/08/2018   Procedure: LOWER EXTREMITY ANGIOGRAPHY;  Surgeon: Katha Cabal, MD;  Location: [place] [facility];  Service: Cardiovascular;  Laterality: Right;   TUBAL LIGATION     TUBAL LIGATION     WOUND DEBRIDEMENT Left 12/21/2018   Procedure: DEBRIDEMENT WOUND;  Surgeon: Katha Cabal, MD;  Location: [facility];  Service: Vascular;  Laterality: Left;   WOUND DEBRIDEMENT Left 12/30/2018   Procedure: DEBRIDEMENT WOUND WITH VAC PLACEMENT (LEFT UPPER EXTREMITY);  Surgeon: Katha Cabal, MD;  Location: [facility] OR[facility];  Service: Vascular;  Laterality: Left;      Allergies  Allergen Reactions   Shellfish Allergy Anaphylaxis and Swelling   Diazepam Other (See Comments)    "felt like out of body experience"    Current Outpatient Medications  Medication Sig Dispense Refill   Accu-Chek FastClix Lancets MISC USE AS DIRECTED TO CHECK BLOOD SUGAR THREE TIMES DAILY AND AT BEDTIME 102 each 0   albuterol (PROAIR HFA) 108 (90 Base) MCG/ACT inhaler INHALE 2 PUFFS BY MOUTH EVERY 4 HOURS AS NEEDED FOR  SHORTNESS OF BREATH 8.5 g 5   allopurinol (ZYLOPRIM) 100 MG tablet Take 1 tablet (100 mg total) by mouth every morning. 90 tablet 1   aspirin EC 81 MG EC tablet Take 1 tablet (81 mg total) by mouth daily.     atorvastatin (LIPITOR) 80 MG tablet TAKE 1 TABLET BY MOUTH ONCE DAILY AT 6PM 28 tablet 10   AURYXIA 1 GM 210 MG(Fe) tablet Take 420 mg by mouth 2 (two) times daily with a meal.      carvedilol (COREG) 25 MG tablet TAKE (1) TABLET BY MOUTH TWICE A DAY WITH MEALS (BREAKFAST AND SUPPER) 56 tablet 10   cyclobenzaprine (FLEXERIL) 5 MG tablet Take 1 tablet (5 mg total) by mouth 3 (three) times daily as needed for muscle spasms. 30 tablet 1   docusate sodium (COLACE) 100 MG capsule Take 1 capsule (100 mg total) by mouth 2 (two) times daily as  needed for mild constipation. 30 capsule 0   fluticasone (FLONASE) 50 MCG/ACT nasal spray Place 2 sprays into both nostrils daily as needed for allergies or rhinitis. 48 g 1   hydrALAZINE (APRESOLINE) 100 MG tablet TAKE (1) TABLET BY MOUTH THREE TIMES DAILY 84 tablet 0   hydrOXYzine (ATARAX/VISTARIL) 25 MG tablet Take 1 tablet (25 mg total) by mouth every 6 (six) hours as needed for itching. 120 tablet 10   LEVEMIR FLEXTOUCH 100 UNIT/ML Pen Inject 60 Units into the skin 2 (two) times a day.     liraglutide (VICTOZA) 18 MG/3ML SOPN Start with 0.44m daily and increase by 0.655mper week until max dose of 1.8 mg dose achieved. 9 mL 10   loratadine (CLARITIN) 10 MG tablet TAKE 1 TABLET BY MOUTH ONCE DAILY. 28 tablet 10   mometasone (ELOCON) 0.1 % cream APPLY AS DIRECTED TO AFFECTED AREA ONCE DAILY. 45 g 10   mometasone-formoterol (DULERA) 200-5 MCG/ACT AERO Inhale 2 puffs into the lungs 2 (two) times daily. 13 g 5   montelukast (SINGULAIR) 10 MG tablet TAKE ONE TABLET BY MOUTH AT BEDTIME. 28 tablet 0   nitroGLYCERIN (NITROSTAT) 0.4 MG SL tablet Place 1 tablet (0.4 mg total) under the tongue every 5 (five) minutes x 3 doses as needed for chest pain. 25  tablet 12   NOVOLOG FLEXPEN 100 UNIT/ML FlexPen INJECT 25 UNITS S.Q. THREE TIMES A DAY WITH MEALS. 15 mL 10   omeprazole (PRILOSEC) 20 MG capsule TAKE (1) CAPSULE BY MOUTH ONCE DAILY. 28 capsule 10   oxyCODONE (ROXICODONE) 5 MG immediate release tablet Take 1 tablet (5 mg total) by mouth every 6 (six) hours as needed for moderate pain or severe pain. 50 tablet 0   pregabalin (LYRICA) 75 MG capsule Take 1 capsule (75 mg total) by mouth 2 (two) times daily. 180 capsule 1   SENNA PLUS 8.6-50 MG tablet TAKE ONE TABLET BY MOUTH AT BEDTIME. 28 tablet 10   silver sulfADIAZINE (SILVADENE) 1 % cream Apply 1 application topically daily. 50 g 0   torsemide (DEMADEX) 20 MG tablet TAKE (2) TABLETS BY MOUTH TWICE DAILY. 112 tablet 0   traMADol (ULTRAM) 50 MG tablet One to Two Tabs Every Six Hours as Needed For Pain 40 tablet 0   triamcinolone cream (KENALOG) 0.1 % APPLY TO AFFECTED AREA TWICE DAILY 80 g 0   valACYclovir (VALTREX) 500 MG tablet TAKE (1) TABLET BY MOUTH EVERY OTHER DAY. 14 tablet 10   Vitamin D, Ergocalciferol, (DRISDOL) 50000 units CAPS capsule TAKE 1 CAPSULE BY MOUTH ONCE A MONTH 3 capsule 3   No current facility-administered medications for this visit.         Physical Exam There were no vitals taken for this visit. Gen:  WD/WN, NAD Skin: incision clean and the wound is superficial     Assessment/Plan: 1. Complication from renal dialysis device, subsequent encounter Wound is improving Continue Aquacel  Follow up in one months Will likely need a fistulogram before beginning cannulation        GrHortencia Pilar/12/2018, 3:19 PM   This note was created with Dragon medical transcription system.  Any errors from dictation are unintentional.

## 2019-02-17 ENCOUNTER — Telehealth (INDEPENDENT_AMBULATORY_CARE_PROVIDER_SITE_OTHER): Payer: Self-pay | Admitting: Vascular Surgery

## 2019-02-17 NOTE — Telephone Encounter (Signed)
[name] with [facility] [phone] called stating that she was unable to see the patient this week. She stated she called numerous times and left messages but patient never returned call. AS, CMA

## 2019-02-18 DIAGNOSIS — D509 Iron deficiency anemia, unspecified: Secondary | ICD-10-CM | POA: Diagnosis not present

## 2019-02-18 DIAGNOSIS — Z992 Dependence on renal dialysis: Secondary | ICD-10-CM | POA: Diagnosis not present

## 2019-02-18 DIAGNOSIS — D631 Anemia in chronic kidney disease: Secondary | ICD-10-CM | POA: Diagnosis not present

## 2019-02-18 DIAGNOSIS — N186 End stage renal disease: Secondary | ICD-10-CM | POA: Diagnosis not present

## 2019-02-18 DIAGNOSIS — N2581 Secondary hyperparathyroidism of renal origin: Secondary | ICD-10-CM | POA: Diagnosis not present

## 2019-02-20 ENCOUNTER — Other Ambulatory Visit (INDEPENDENT_AMBULATORY_CARE_PROVIDER_SITE_OTHER): Payer: Self-pay | Admitting: Nurse Practitioner

## 2019-02-21 DIAGNOSIS — D509 Iron deficiency anemia, unspecified: Secondary | ICD-10-CM | POA: Diagnosis not present

## 2019-02-21 DIAGNOSIS — D631 Anemia in chronic kidney disease: Secondary | ICD-10-CM | POA: Diagnosis not present

## 2019-02-21 DIAGNOSIS — N186 End stage renal disease: Secondary | ICD-10-CM | POA: Diagnosis not present

## 2019-02-21 DIAGNOSIS — N2581 Secondary hyperparathyroidism of renal origin: Secondary | ICD-10-CM | POA: Diagnosis not present

## 2019-02-21 DIAGNOSIS — Z992 Dependence on renal dialysis: Secondary | ICD-10-CM | POA: Diagnosis not present

## 2019-02-22 DIAGNOSIS — N186 End stage renal disease: Secondary | ICD-10-CM | POA: Diagnosis not present

## 2019-02-22 DIAGNOSIS — E1122 Type 2 diabetes mellitus with diabetic chronic kidney disease: Secondary | ICD-10-CM | POA: Diagnosis not present

## 2019-02-22 DIAGNOSIS — I5042 Chronic combined systolic (congestive) and diastolic (congestive) heart failure: Secondary | ICD-10-CM | POA: Diagnosis not present

## 2019-02-22 DIAGNOSIS — T8131XA Disruption of external operation (surgical) wound, not elsewhere classified, initial encounter: Secondary | ICD-10-CM | POA: Diagnosis not present

## 2019-02-22 DIAGNOSIS — I2510 Atherosclerotic heart disease of native coronary artery without angina pectoris: Secondary | ICD-10-CM | POA: Diagnosis not present

## 2019-02-22 DIAGNOSIS — I132 Hypertensive heart and chronic kidney disease with heart failure and with stage 5 chronic kidney disease, or end stage renal disease: Secondary | ICD-10-CM | POA: Diagnosis not present

## 2019-02-23 DIAGNOSIS — D631 Anemia in chronic kidney disease: Secondary | ICD-10-CM | POA: Diagnosis not present

## 2019-02-23 DIAGNOSIS — D509 Iron deficiency anemia, unspecified: Secondary | ICD-10-CM | POA: Diagnosis not present

## 2019-02-23 DIAGNOSIS — Z992 Dependence on renal dialysis: Secondary | ICD-10-CM | POA: Diagnosis not present

## 2019-02-23 DIAGNOSIS — N2581 Secondary hyperparathyroidism of renal origin: Secondary | ICD-10-CM | POA: Diagnosis not present

## 2019-02-23 DIAGNOSIS — N186 End stage renal disease: Secondary | ICD-10-CM | POA: Diagnosis not present

## 2019-02-25 DIAGNOSIS — Z992 Dependence on renal dialysis: Secondary | ICD-10-CM | POA: Diagnosis not present

## 2019-02-25 DIAGNOSIS — D631 Anemia in chronic kidney disease: Secondary | ICD-10-CM | POA: Diagnosis not present

## 2019-02-25 DIAGNOSIS — D509 Iron deficiency anemia, unspecified: Secondary | ICD-10-CM | POA: Diagnosis not present

## 2019-02-25 DIAGNOSIS — N2581 Secondary hyperparathyroidism of renal origin: Secondary | ICD-10-CM | POA: Diagnosis not present

## 2019-02-25 DIAGNOSIS — N186 End stage renal disease: Secondary | ICD-10-CM | POA: Diagnosis not present

## 2019-02-28 DIAGNOSIS — N186 End stage renal disease: Secondary | ICD-10-CM | POA: Diagnosis not present

## 2019-02-28 DIAGNOSIS — N2581 Secondary hyperparathyroidism of renal origin: Secondary | ICD-10-CM | POA: Diagnosis not present

## 2019-02-28 DIAGNOSIS — D631 Anemia in chronic kidney disease: Secondary | ICD-10-CM | POA: Diagnosis not present

## 2019-02-28 DIAGNOSIS — Z992 Dependence on renal dialysis: Secondary | ICD-10-CM | POA: Diagnosis not present

## 2019-02-28 DIAGNOSIS — D509 Iron deficiency anemia, unspecified: Secondary | ICD-10-CM | POA: Diagnosis not present

## 2019-03-01 DIAGNOSIS — I132 Hypertensive heart and chronic kidney disease with heart failure and with stage 5 chronic kidney disease, or end stage renal disease: Secondary | ICD-10-CM | POA: Diagnosis not present

## 2019-03-01 DIAGNOSIS — I2510 Atherosclerotic heart disease of native coronary artery without angina pectoris: Secondary | ICD-10-CM | POA: Diagnosis not present

## 2019-03-01 DIAGNOSIS — E1122 Type 2 diabetes mellitus with diabetic chronic kidney disease: Secondary | ICD-10-CM | POA: Diagnosis not present

## 2019-03-01 DIAGNOSIS — N186 End stage renal disease: Secondary | ICD-10-CM | POA: Diagnosis not present

## 2019-03-01 DIAGNOSIS — I5042 Chronic combined systolic (congestive) and diastolic (congestive) heart failure: Secondary | ICD-10-CM | POA: Diagnosis not present

## 2019-03-01 DIAGNOSIS — T8131XA Disruption of external operation (surgical) wound, not elsewhere classified, initial encounter: Secondary | ICD-10-CM | POA: Diagnosis not present

## 2019-03-02 DIAGNOSIS — D631 Anemia in chronic kidney disease: Secondary | ICD-10-CM | POA: Diagnosis not present

## 2019-03-02 DIAGNOSIS — Z992 Dependence on renal dialysis: Secondary | ICD-10-CM | POA: Diagnosis not present

## 2019-03-02 DIAGNOSIS — N2581 Secondary hyperparathyroidism of renal origin: Secondary | ICD-10-CM | POA: Diagnosis not present

## 2019-03-02 DIAGNOSIS — N186 End stage renal disease: Secondary | ICD-10-CM | POA: Diagnosis not present

## 2019-03-02 DIAGNOSIS — D509 Iron deficiency anemia, unspecified: Secondary | ICD-10-CM | POA: Diagnosis not present

## 2019-03-04 DIAGNOSIS — N186 End stage renal disease: Secondary | ICD-10-CM | POA: Diagnosis not present

## 2019-03-04 DIAGNOSIS — D631 Anemia in chronic kidney disease: Secondary | ICD-10-CM | POA: Diagnosis not present

## 2019-03-04 DIAGNOSIS — D509 Iron deficiency anemia, unspecified: Secondary | ICD-10-CM | POA: Diagnosis not present

## 2019-03-04 DIAGNOSIS — N2581 Secondary hyperparathyroidism of renal origin: Secondary | ICD-10-CM | POA: Diagnosis not present

## 2019-03-04 DIAGNOSIS — Z992 Dependence on renal dialysis: Secondary | ICD-10-CM | POA: Diagnosis not present

## 2019-03-07 DIAGNOSIS — N186 End stage renal disease: Secondary | ICD-10-CM | POA: Diagnosis not present

## 2019-03-07 DIAGNOSIS — Z992 Dependence on renal dialysis: Secondary | ICD-10-CM | POA: Diagnosis not present

## 2019-03-07 DIAGNOSIS — D509 Iron deficiency anemia, unspecified: Secondary | ICD-10-CM | POA: Diagnosis not present

## 2019-03-07 DIAGNOSIS — D631 Anemia in chronic kidney disease: Secondary | ICD-10-CM | POA: Diagnosis not present

## 2019-03-07 DIAGNOSIS — N2581 Secondary hyperparathyroidism of renal origin: Secondary | ICD-10-CM | POA: Diagnosis not present

## 2019-03-09 ENCOUNTER — Ambulatory Visit: Payer: Medicare Other | Admitting: Infectious Diseases

## 2019-03-09 DIAGNOSIS — D509 Iron deficiency anemia, unspecified: Secondary | ICD-10-CM | POA: Diagnosis not present

## 2019-03-09 DIAGNOSIS — D631 Anemia in chronic kidney disease: Secondary | ICD-10-CM | POA: Diagnosis not present

## 2019-03-09 DIAGNOSIS — N186 End stage renal disease: Secondary | ICD-10-CM | POA: Diagnosis not present

## 2019-03-09 DIAGNOSIS — Z992 Dependence on renal dialysis: Secondary | ICD-10-CM | POA: Diagnosis not present

## 2019-03-09 DIAGNOSIS — N2581 Secondary hyperparathyroidism of renal origin: Secondary | ICD-10-CM | POA: Diagnosis not present

## 2019-03-11 DIAGNOSIS — D631 Anemia in chronic kidney disease: Secondary | ICD-10-CM | POA: Diagnosis not present

## 2019-03-11 DIAGNOSIS — Z992 Dependence on renal dialysis: Secondary | ICD-10-CM | POA: Diagnosis not present

## 2019-03-11 DIAGNOSIS — N186 End stage renal disease: Secondary | ICD-10-CM | POA: Diagnosis not present

## 2019-03-11 DIAGNOSIS — D509 Iron deficiency anemia, unspecified: Secondary | ICD-10-CM | POA: Diagnosis not present

## 2019-03-11 DIAGNOSIS — N2581 Secondary hyperparathyroidism of renal origin: Secondary | ICD-10-CM | POA: Diagnosis not present

## 2019-03-14 ENCOUNTER — Ambulatory Visit
Admission: RE | Admit: 2019-03-14 | Discharge: 2019-03-14 | Disposition: A | Discharge status: Home / Self Care | Payer: Medicare Other | Source: Ambulatory Visit | Attending: Vascular Surgery | Admitting: Vascular Surgery

## 2019-03-14 ENCOUNTER — Other Ambulatory Visit: Payer: Self-pay

## 2019-03-14 DIAGNOSIS — Z992 Dependence on renal dialysis: Secondary | ICD-10-CM | POA: Diagnosis not present

## 2019-03-14 DIAGNOSIS — N186 End stage renal disease: Secondary | ICD-10-CM | POA: Diagnosis not present

## 2019-03-14 DIAGNOSIS — I712 Thoracic aortic aneurysm, without rupture, unspecified: Secondary | ICD-10-CM

## 2019-03-14 DIAGNOSIS — D509 Iron deficiency anemia, unspecified: Secondary | ICD-10-CM | POA: Diagnosis not present

## 2019-03-14 DIAGNOSIS — I120 Hypertensive chronic kidney disease with stage 5 chronic kidney disease or end stage renal disease: Secondary | ICD-10-CM | POA: Diagnosis not present

## 2019-03-14 DIAGNOSIS — M109 Gout, unspecified: Secondary | ICD-10-CM | POA: Diagnosis not present

## 2019-03-14 DIAGNOSIS — D631 Anemia in chronic kidney disease: Secondary | ICD-10-CM | POA: Diagnosis not present

## 2019-03-14 DIAGNOSIS — N2581 Secondary hyperparathyroidism of renal origin: Secondary | ICD-10-CM | POA: Diagnosis not present

## 2019-03-14 DIAGNOSIS — J45909 Unspecified asthma, uncomplicated: Secondary | ICD-10-CM | POA: Diagnosis not present

## 2019-03-14 DIAGNOSIS — Z955 Presence of coronary angioplasty implant and graft: Secondary | ICD-10-CM | POA: Diagnosis not present

## 2019-03-14 DIAGNOSIS — I2510 Atherosclerotic heart disease of native coronary artery without angina pectoris: Secondary | ICD-10-CM | POA: Diagnosis not present

## 2019-03-14 DIAGNOSIS — Z87891 Personal history of nicotine dependence: Secondary | ICD-10-CM | POA: Diagnosis not present

## 2019-03-14 DIAGNOSIS — E1129 Type 2 diabetes mellitus with other diabetic kidney complication: Secondary | ICD-10-CM | POA: Diagnosis not present

## 2019-03-14 DIAGNOSIS — E1122 Type 2 diabetes mellitus with diabetic chronic kidney disease: Secondary | ICD-10-CM | POA: Diagnosis not present

## 2019-03-14 DIAGNOSIS — Z23 Encounter for immunization: Secondary | ICD-10-CM | POA: Diagnosis not present

## 2019-03-14 DIAGNOSIS — B182 Chronic viral hepatitis C: Secondary | ICD-10-CM | POA: Diagnosis not present

## 2019-03-14 DIAGNOSIS — Z86010 Personal history of colonic polyps: Secondary | ICD-10-CM | POA: Diagnosis not present

## 2019-03-14 MED ORDER — IOHEXOL 350 MG/ML SOLN
75.0000 mL | Freq: Once | INTRAVENOUS | Status: AC | PRN
  Administered 2019-03-14: 75 mL via INTRAVENOUS

## 2019-03-16 ENCOUNTER — Encounter: Payer: Self-pay | Admitting: Infectious Diseases

## 2019-03-16 ENCOUNTER — Other Ambulatory Visit: Payer: Self-pay | Admitting: Physician Assistant

## 2019-03-16 ENCOUNTER — Other Ambulatory Visit: Payer: Self-pay

## 2019-03-16 ENCOUNTER — Ambulatory Visit: Payer: Medicare Other | Attending: Infectious Diseases | Admitting: Infectious Diseases

## 2019-03-16 VITALS — BP 142/77 | HR 93 | Temp 99.0°F | Ht 67.0 in | Wt 289.0 lb

## 2019-03-16 DIAGNOSIS — Z7982 Long term (current) use of aspirin: Secondary | ICD-10-CM

## 2019-03-16 DIAGNOSIS — E1122 Type 2 diabetes mellitus with diabetic chronic kidney disease: Secondary | ICD-10-CM | POA: Diagnosis not present

## 2019-03-16 DIAGNOSIS — Z86010 Personal history of colonic polyps: Secondary | ICD-10-CM | POA: Diagnosis not present

## 2019-03-16 DIAGNOSIS — J9601 Acute respiratory failure with hypoxia: Secondary | ICD-10-CM

## 2019-03-16 DIAGNOSIS — Z91013 Allergy to seafood: Secondary | ICD-10-CM

## 2019-03-16 DIAGNOSIS — B182 Chronic viral hepatitis C: Secondary | ICD-10-CM | POA: Diagnosis not present

## 2019-03-16 DIAGNOSIS — Z79899 Other long term (current) drug therapy: Secondary | ICD-10-CM

## 2019-03-16 DIAGNOSIS — I2510 Atherosclerotic heart disease of native coronary artery without angina pectoris: Secondary | ICD-10-CM

## 2019-03-16 DIAGNOSIS — J301 Allergic rhinitis due to pollen: Secondary | ICD-10-CM

## 2019-03-16 DIAGNOSIS — Z992 Dependence on renal dialysis: Secondary | ICD-10-CM

## 2019-03-16 DIAGNOSIS — Z87891 Personal history of nicotine dependence: Secondary | ICD-10-CM | POA: Diagnosis not present

## 2019-03-16 DIAGNOSIS — M109 Gout, unspecified: Secondary | ICD-10-CM | POA: Diagnosis not present

## 2019-03-16 DIAGNOSIS — I120 Hypertensive chronic kidney disease with stage 5 chronic kidney disease or end stage renal disease: Secondary | ICD-10-CM

## 2019-03-16 DIAGNOSIS — J45909 Unspecified asthma, uncomplicated: Secondary | ICD-10-CM | POA: Diagnosis not present

## 2019-03-16 DIAGNOSIS — D631 Anemia in chronic kidney disease: Secondary | ICD-10-CM

## 2019-03-16 DIAGNOSIS — Z955 Presence of coronary angioplasty implant and graft: Secondary | ICD-10-CM

## 2019-03-16 DIAGNOSIS — I5032 Chronic diastolic (congestive) heart failure: Secondary | ICD-10-CM

## 2019-03-16 DIAGNOSIS — D509 Iron deficiency anemia, unspecified: Secondary | ICD-10-CM | POA: Diagnosis not present

## 2019-03-16 DIAGNOSIS — N186 End stage renal disease: Secondary | ICD-10-CM | POA: Diagnosis not present

## 2019-03-16 DIAGNOSIS — I10 Essential (primary) hypertension: Secondary | ICD-10-CM

## 2019-03-16 DIAGNOSIS — N2581 Secondary hyperparathyroidism of renal origin: Secondary | ICD-10-CM | POA: Diagnosis not present

## 2019-03-16 DIAGNOSIS — Z7951 Long term (current) use of inhaled steroids: Secondary | ICD-10-CM

## 2019-03-16 DIAGNOSIS — E1129 Type 2 diabetes mellitus with other diabetic kidney complication: Secondary | ICD-10-CM | POA: Diagnosis not present

## 2019-03-16 DIAGNOSIS — Z888 Allergy status to other drugs, medicaments and biological substances status: Secondary | ICD-10-CM

## 2019-03-16 DIAGNOSIS — Z794 Long term (current) use of insulin: Secondary | ICD-10-CM

## 2019-03-16 NOTE — Progress Notes (Signed)
NAME: [name]  DOB: Sep 08, 1955  MRN: [id number]  Date/Time: 03/16/2019 1:15 PM  REQUESTING PROVIDER: [name] Subjective:  REASON FOR CONSULT: Hepatitis C ? [name] is a 63 y.o. female with a history of hypertension, diabetes mellitus, coronary artery disease status post stent in 2018, end-stage renal disease, started dialysis a year ago, recently infected AV graft on the left arm which has been removed and now the wound is healing is here to see me for hepatitis C management. Patient just had a dialysis and is feeling a little tired. She says she was told that she had hep C many years ago.  She used to do drugs but has been clean since 1999. She was assessed by GI in June 2019 for hep C and underwent colonoscopy and endoscopy on 02/03/2018 which was normal except for a 5 mm polyp in the colon. But it looks like her other medical conditions took precedence including end-stage renal disease and she had not seen GI since then.  Patient has fatigue She denies nausea vomiting or hematemesis. She says she may see blood at times when she wipes especially after hard stool. Her daughter lives with her.  She lives in [place] but would like to get care here  Medical history On 03/08/2018 she had a lower extremity angiogram by Dr. Franchot Gallo. On 05/11/2019 admitted to the hospital with acute enteritis during that hospitalization her creatinine was high and she was found to be in end-stage renal disease  and underwent insertion of tunneled dialysis catheter on the right chest wall and started on dialysis.  On 09/08/2018 she had a left brachial basilic AV fistula placement by Dr. Franchot Gallo.  She had a second stage basilic vein transposition on 11/18/2018.  That got infected and on 12/21/2018 she had excisional debridement with drainage of a seroma at the site of the left arm basilic transposition.  And on 12/30/2018 she had another excisional debridement of the left upper arm wound and placement  of VAC.  I had seen her during that hospitalization.  The wound culture was positive for enterococcus, Prevotella and Haemophilus parainfluenza.  She got IV vancomycin, ceftazidime during dialysis and p.o. Flagyl for few weeks.  She was followed by vascular and on 02/16/2019 during her last visit the wound was very superficial and the back was no longer needed.  It looks like we are trying to get her to see the transplant team at [place] history Anemia Aortic stenosis as evidenced on echo Asthma as a child Coronary artery disease status post DES to proximal LAD in 2018 End-stage renal disease Hyperlipidemia Diabetes Depression Pancreatitis Jehovah's Witness hence will not take blood transfusion Tremors hands seen neurology GERD Gout Infected AV fistula    Past surgical history Bilateral cataract surgery AV fistula placement Infected AV fistula and removal of it Dialysis permacath placement Colonoscopy/upper GI Coronary angioplasty Tubal ligation  Cholecystectomy Social History   Socioeconomic History   Marital status: Divorced    Spouse name: Not on file   Number of children: Not on file   Years of education: Not on file   Highest education level: Not on file  Occupational History   Not on file  Social Needs   Financial resource strain: Not on file   Food insecurity    Worry: Not on file    Inability: Not on file   Transportation needs    Medical: Not on file    Non-medical: Not on file  Tobacco Use   Smoking status: Former Smoker    Packs/day: 0.25    Years: 6.00    Pack years: 1.50    Types: Cigarettes    Quit date: 10/25/1980    Years since quitting: 38.4   Smokeless tobacco: Never Used  Substance and Sexual Activity   Alcohol use: Yes    Comment: rarely   Drug use: Not Currently    Types: "Crack" cocaine, Marijuana    Comment: 08/21/2016 "clean since 05/1998"   Sexual activity: Not on file    Comment: Not asked  Lifestyle    Physical activity    Days per week: Not on file    Minutes per session: Not on file   Stress: Not on file  Relationships   Social connections    Talks on phone: Not on file    Gets together: Not on file    Attends religious service: Not on file    Active member of club or organization: Not on file    Attends meetings of clubs or organizations: Not on file    Relationship status: Not on file   Intimate partner violence    Fear of current or ex partner: Not on file    Emotionally abused: Not on file    Physically abused: Not on file    Forced sexual activity: Not on file  Other Topics Concern   Not on file  Social History Narrative   Not on file    Family History  Problem Relation Age of Onset   Colon cancer Mother    Heart attack Other    Heart attack Maternal Grandmother    Hypertension Sister    Hypertension Brother    Diabetes Paternal Grandmother    Breast cancer Neg Hx    Allergies  Allergen Reactions   Shellfish Allergy Anaphylaxis and Swelling   Diazepam Other (See Comments)    "felt like out of body experience"     ? Current Outpatient Medications  Medication Sig Dispense Refill   Accu-Chek FastClix Lancets MISC USE AS DIRECTED TO CHECK BLOOD SUGAR THREE TIMES DAILY AND AT BEDTIME 102 each 0   albuterol (PROAIR HFA) 108 (90 Base) MCG/ACT inhaler INHALE 2 PUFFS BY MOUTH EVERY 4 HOURS AS NEEDED FOR SHORTNESS OF BREATH 8.5 g 5   allopurinol (ZYLOPRIM) 100 MG tablet Take 1 tablet (100 mg total) by mouth every morning. 90 tablet 1   aspirin EC 81 MG EC tablet Take 1 tablet (81 mg total) by mouth daily.     atorvastatin (LIPITOR) 80 MG tablet TAKE 1 TABLET BY MOUTH ONCE DAILY AT 6PM 28 tablet 10   AURYXIA 1 GM 210 MG(Fe) tablet Take 420 mg by mouth 2 (two) times daily with a meal.      carvedilol (COREG) 25 MG tablet TAKE (1) TABLET BY MOUTH TWICE A DAY WITH MEALS (BREAKFAST AND SUPPER) 56 tablet 10   cyclobenzaprine (FLEXERIL) 5 MG tablet Take  1 tablet (5 mg total) by mouth 3 (three) times daily as needed for muscle spasms. 30 tablet 1   docusate sodium (COLACE) 100 MG capsule Take 1 capsule (100 mg total) by mouth 2 (two) times daily as needed for mild constipation. 30 capsule 0   fluticasone (FLONASE) 50 MCG/ACT nasal spray Place 2 sprays into both nostrils daily as needed for allergies or rhinitis. 48 g 1   hydrALAZINE (APRESOLINE) 100 MG tablet TAKE (1) TABLET BY MOUTH THREE TIMES DAILY 84 tablet 0   hydrOXYzine (  ATARAX/VISTARIL) 25 MG tablet Take 1 tablet (25 mg total) by mouth every 6 (six) hours as needed for itching. 120 tablet 10   LEVEMIR FLEXTOUCH 100 UNIT/ML Pen Inject 60 Units into the skin 2 (two) times a day.     liraglutide (VICTOZA) 18 MG/3ML SOPN Start with 0.6mg  daily and increase by 0.6mg  per week until max dose of 1.8 mg dose achieved. 9 mL 10   loratadine (CLARITIN) 10 MG tablet TAKE 1 TABLET BY MOUTH ONCE DAILY. 28 tablet 10   mometasone (ELOCON) 0.1 % cream APPLY AS DIRECTED TO AFFECTED AREA ONCE DAILY. 45 g 10   mometasone-formoterol (DULERA) 200-5 MCG/ACT AERO Inhale 2 puffs into the lungs 2 (two) times daily. 13 g 5   montelukast (SINGULAIR) 10 MG tablet TAKE ONE TABLET BY MOUTH AT BEDTIME. 28 tablet 0   nitroGLYCERIN (NITROSTAT) 0.4 MG SL tablet Place 1 tablet (0.4 mg total) under the tongue every 5 (five) minutes x 3 doses as needed for chest pain. 25 tablet 12   NOVOLOG FLEXPEN 100 UNIT/ML FlexPen INJECT 25 UNITS S.Q. THREE TIMES A DAY WITH MEALS. 15 mL 10   omeprazole (PRILOSEC) 20 MG capsule TAKE (1) CAPSULE BY MOUTH ONCE DAILY. 28 capsule 10   pregabalin (LYRICA) 75 MG capsule Take 1 capsule (75 mg total) by mouth 2 (two) times daily. 180 capsule 1   SENNA PLUS 8.6-50 MG tablet TAKE ONE TABLET BY MOUTH AT BEDTIME. 28 tablet 10   silver sulfADIAZINE (SILVADENE) 1 % cream Apply 1 application topically daily. Apply to right first toe 50 g 0   torsemide (DEMADEX) 20 MG tablet TAKE (2) TABLETS  BY MOUTH TWICE DAILY. 112 tablet 0   traMADol (ULTRAM) 50 MG tablet One to Two Tabs Every Six Hours as Needed For Pain 40 tablet 0   triamcinolone cream (KENALOG) 0.1 % APPLY TO AFFECTED AREA TWICE DAILY 80 g 0   valACYclovir (VALTREX) 500 MG tablet TAKE (1) TABLET BY MOUTH EVERY OTHER DAY. 14 tablet 10   Vitamin D, Ergocalciferol, (DRISDOL) 1.25 MG (50000 UT) CAPS capsule TAKE 1 CAPSULE BY MOUTH ONCE A MONTH 3 capsule 3   No current facility-administered medications for this visit.      Abtx:  Anti-infectives (From admission, onward)   None      REVIEW OF SYSTEMS:  Const: negative fever, negative chills, negative weight loss Eyes: negative diplopia or visual changes, negative eye pain ENT: negative coryza, negative sore throat Resp: negative cough, hemoptysis, dyspnea Cards: negative for chest pain, palpitations, has lower extremity edema GU: negative for frequency, dysuria and hematuria GI: Negative for abdominal pain, diarrhea, bleeding, constipation Skin: negative for rash and pruritus Heme: negative for easy bruising and gum/nose bleeding MS: Fatigue, weakness, Neurolo:negative for headaches, dizziness, vertigo, memory problems  Psych: History of depression in the past Endocrine: negative for thyroid, diabetes Allergy/Immunology-shellfish and diazepam Objective:  VITALS:  BP (!) 142/77 (BP Location: Right Arm, Patient Position: Sitting, Cuff Size: Large)    Pulse 93    Temp 99 F (37.2 C) (Oral)    Ht 5\' 7"  (1.702 m)    Wt 289 lb (131.1 kg)    BMI 45.26 kg/m  PHYSICAL EXAM:  General: Alert, cooperative, no distress, appears stated age.  Head: Normocephalic, without obvious abnormality, atraumatic. Eyes: Conjunctivae clear, anicteric sclerae. Pupils are equal ENT did not examine because of mask Neck: Supple, supraclavicular pad of Fat left more than right No carotid bruit and no JVD. Back: No CVA tenderness.  Lungs: Clear to auscultation bilaterally. No Wheezing or  Rhonchi. No rales. Right-sided permacath Heart: Regular rate and rhythm, no murmur, rub or gallop. Abdomen: Soft, non-tender,not distended. Bowel sounds normal. No masses Extremities: Left site of the infected fistula which has been removed and the site is healing well 03/16/19   12/28/18    Skin: as above Lymph: Cervical, supraclavicular normal. Neurologic: Grossly non-focal Pertinent Labs Lab Results CBC    Component Value Date/Time   WBC 8.8 01/02/2019 0638   RBC 3.07 (L) 01/02/2019 0638   HGB 9.0 (L) 01/02/2019 0638   HGB 8.9 (L) 01/10/2018 1124   HCT 29.0 (L) 01/02/2019 0638   HCT 28.2 (L) 01/10/2018 1124   PLT 201 01/02/2019 0638   PLT 285 01/10/2018 1124   MCV 94.5 01/02/2019 0638   MCV 81 01/10/2018 1124   MCH 29.3 01/02/2019 0638   MCHC 31.0 01/02/2019 0638   RDW 16.3 (H) 01/02/2019 0638   RDW 19.6 (H) 01/10/2018 1124   LYMPHSABS 1.2 12/28/2018 1345   LYMPHSABS 1.9 01/10/2018 1124   MONOABS 0.8 12/28/2018 1345   EOSABS 0.2 12/28/2018 1345   EOSABS 0.5 (H) 01/10/2018 1124   BASOSABS 0.0 12/28/2018 1345   BASOSABS 0.0 01/10/2018 1124    CMP Latest Ref Rng & Units 01/02/2019 01/01/2019 12/30/2018  Glucose 70 - 99 mg/dL 226(H) 345(H) 225(H)  BUN 6 - 20 mg/dL 63(H) 42(H) 49(H)  Creatinine 0.44 - 1.00 mg/dL 6.23(H) 4.65(H) 5.23(H)  Sodium 135 - 145 mmol/L 135 132(L) 132(L)  Potassium 3.5 - 5.1 mmol/L 4.5 3.9 3.8  Chloride 98 - 111 mmol/L 96(L) 94(L) 91(L)  CO2 22 - 32 mmol/L 28 26 26   Calcium 8.9 - 10.3 mg/dL 8.3(L) 7.6(L) 7.9(L)  Total Protein 6.5 - 8.1 g/dL - - -  Total Bilirubin 0.3 - 1.2 mg/dL - - -  Alkaline Phos 38 - 126 U/L - - -  AST 15 - 41 U/L - - -  ALT 0 - 44 U/L - - -    Tests result DATE comment  HEPC RNA     HEPC  Genotype     Hepatitis B profile     Hepatitis A status     PT/PTT     AST, ALT, Bilirubin     Albumin     creatinine     HB/Platelet     HIV     AFP     TSH     comorbidities      tobacco     alcohol     drug     APRI  Score 0.291    FIB 4 score 1.49    Current meds     Ultrasound Elastography                ? Impression/Recommendation 63 year old female with multiple comorbidities is here to discuss management of hepatitis C.    Hepatitis C.  Genotype 1, last viral load 2 years ago was 1.6 million copies. She has never been treated before. She does not have thrombocytopenia.  LFTs look fine except for alkaline phosphatase of 287. She is end-stage renal disease on dialysis She is got diabetes mellitus on liraglutide, Levemir,  Infiltrate AV fistula/status post removal/and IV antibiotics in June& July . wound is healing well  Coronary artery disease status post stent on aspirin, atorvastatin  Gout on allopurinol  Asthma on Singulair and Dulera  ESRD on Auryxia  Anemia of chronic disease ? ?We will do labs today to  assess her liver status and also to calculate the Apri and fib 4 score, ultrasound elastography, will discuss with pharmacist before planning on the regimen and treatment We will follow-up in 2 weeks.  ___________________________________________________ Discussed with patient in detail  Note:  This document was prepared using Dragon voice recognition software and may include unintentional dictation errors.

## 2019-03-16 NOTE — Patient Instructions (Signed)
You are here to discuss treatment for hepatitis C, will do labs and ultrasound liver first. Then will decide on Rx. Will see you in 2 weeks

## 2019-03-18 DIAGNOSIS — N2581 Secondary hyperparathyroidism of renal origin: Secondary | ICD-10-CM | POA: Diagnosis not present

## 2019-03-18 DIAGNOSIS — D509 Iron deficiency anemia, unspecified: Secondary | ICD-10-CM | POA: Diagnosis not present

## 2019-03-18 DIAGNOSIS — N186 End stage renal disease: Secondary | ICD-10-CM | POA: Diagnosis not present

## 2019-03-18 DIAGNOSIS — E1129 Type 2 diabetes mellitus with other diabetic kidney complication: Secondary | ICD-10-CM | POA: Diagnosis not present

## 2019-03-18 DIAGNOSIS — D631 Anemia in chronic kidney disease: Secondary | ICD-10-CM | POA: Diagnosis not present

## 2019-03-18 DIAGNOSIS — Z992 Dependence on renal dialysis: Secondary | ICD-10-CM | POA: Diagnosis not present

## 2019-03-21 DIAGNOSIS — D631 Anemia in chronic kidney disease: Secondary | ICD-10-CM | POA: Diagnosis not present

## 2019-03-21 DIAGNOSIS — Z992 Dependence on renal dialysis: Secondary | ICD-10-CM | POA: Diagnosis not present

## 2019-03-21 DIAGNOSIS — E1129 Type 2 diabetes mellitus with other diabetic kidney complication: Secondary | ICD-10-CM | POA: Diagnosis not present

## 2019-03-21 DIAGNOSIS — N186 End stage renal disease: Secondary | ICD-10-CM | POA: Diagnosis not present

## 2019-03-21 DIAGNOSIS — D509 Iron deficiency anemia, unspecified: Secondary | ICD-10-CM | POA: Diagnosis not present

## 2019-03-21 DIAGNOSIS — N2581 Secondary hyperparathyroidism of renal origin: Secondary | ICD-10-CM | POA: Diagnosis not present

## 2019-03-21 IMAGING — CR DG CHEST 2V
2 series · 2 of 2 positions shown · non-contrast
Comparison: Chest radiograph performed 09/13/2016

CLINICAL DATA: Acute onset of shortness of breath and bilateral leg
swelling. Initial encounter.

EXAM:
CHEST  2 VIEW

[chest pa]
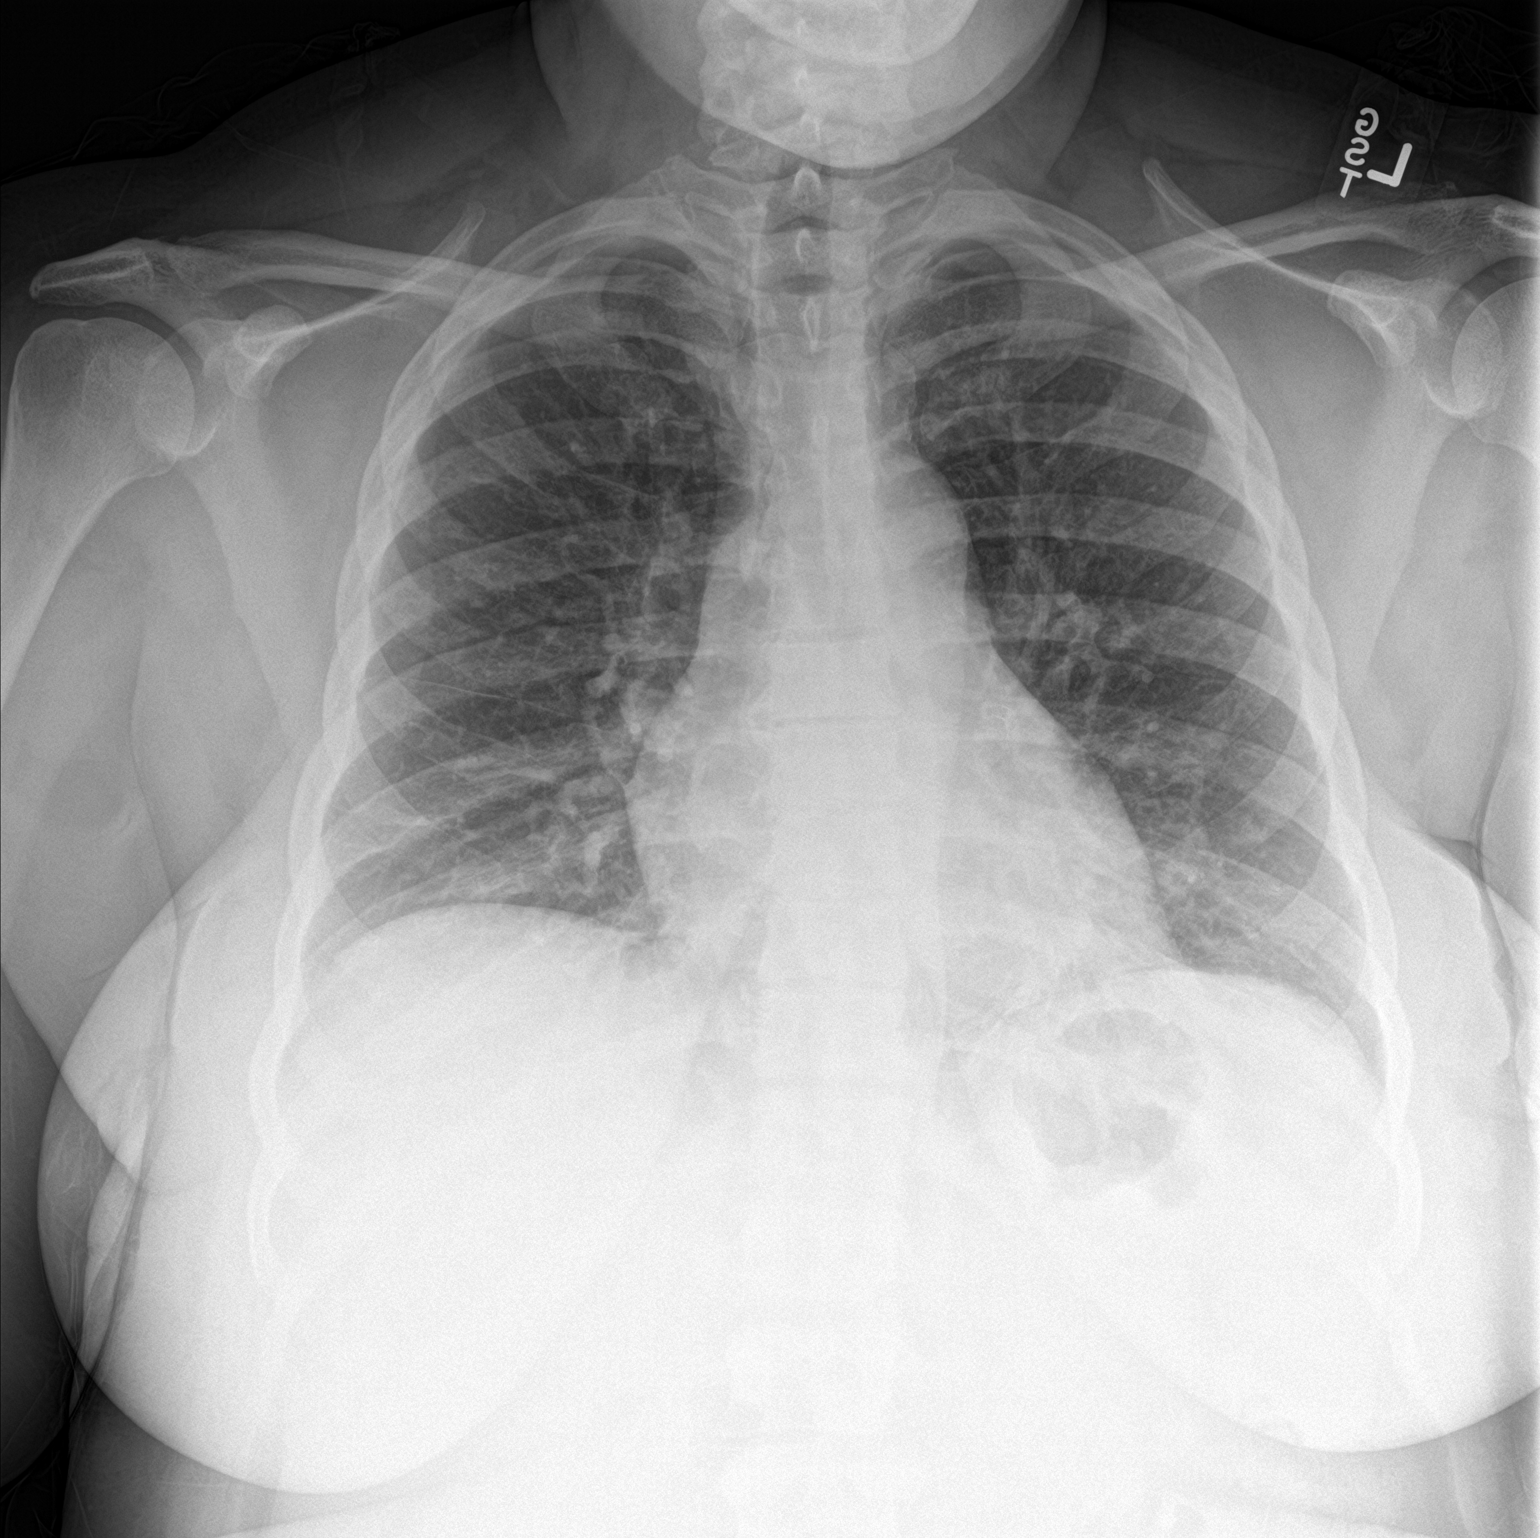

[chest lat]
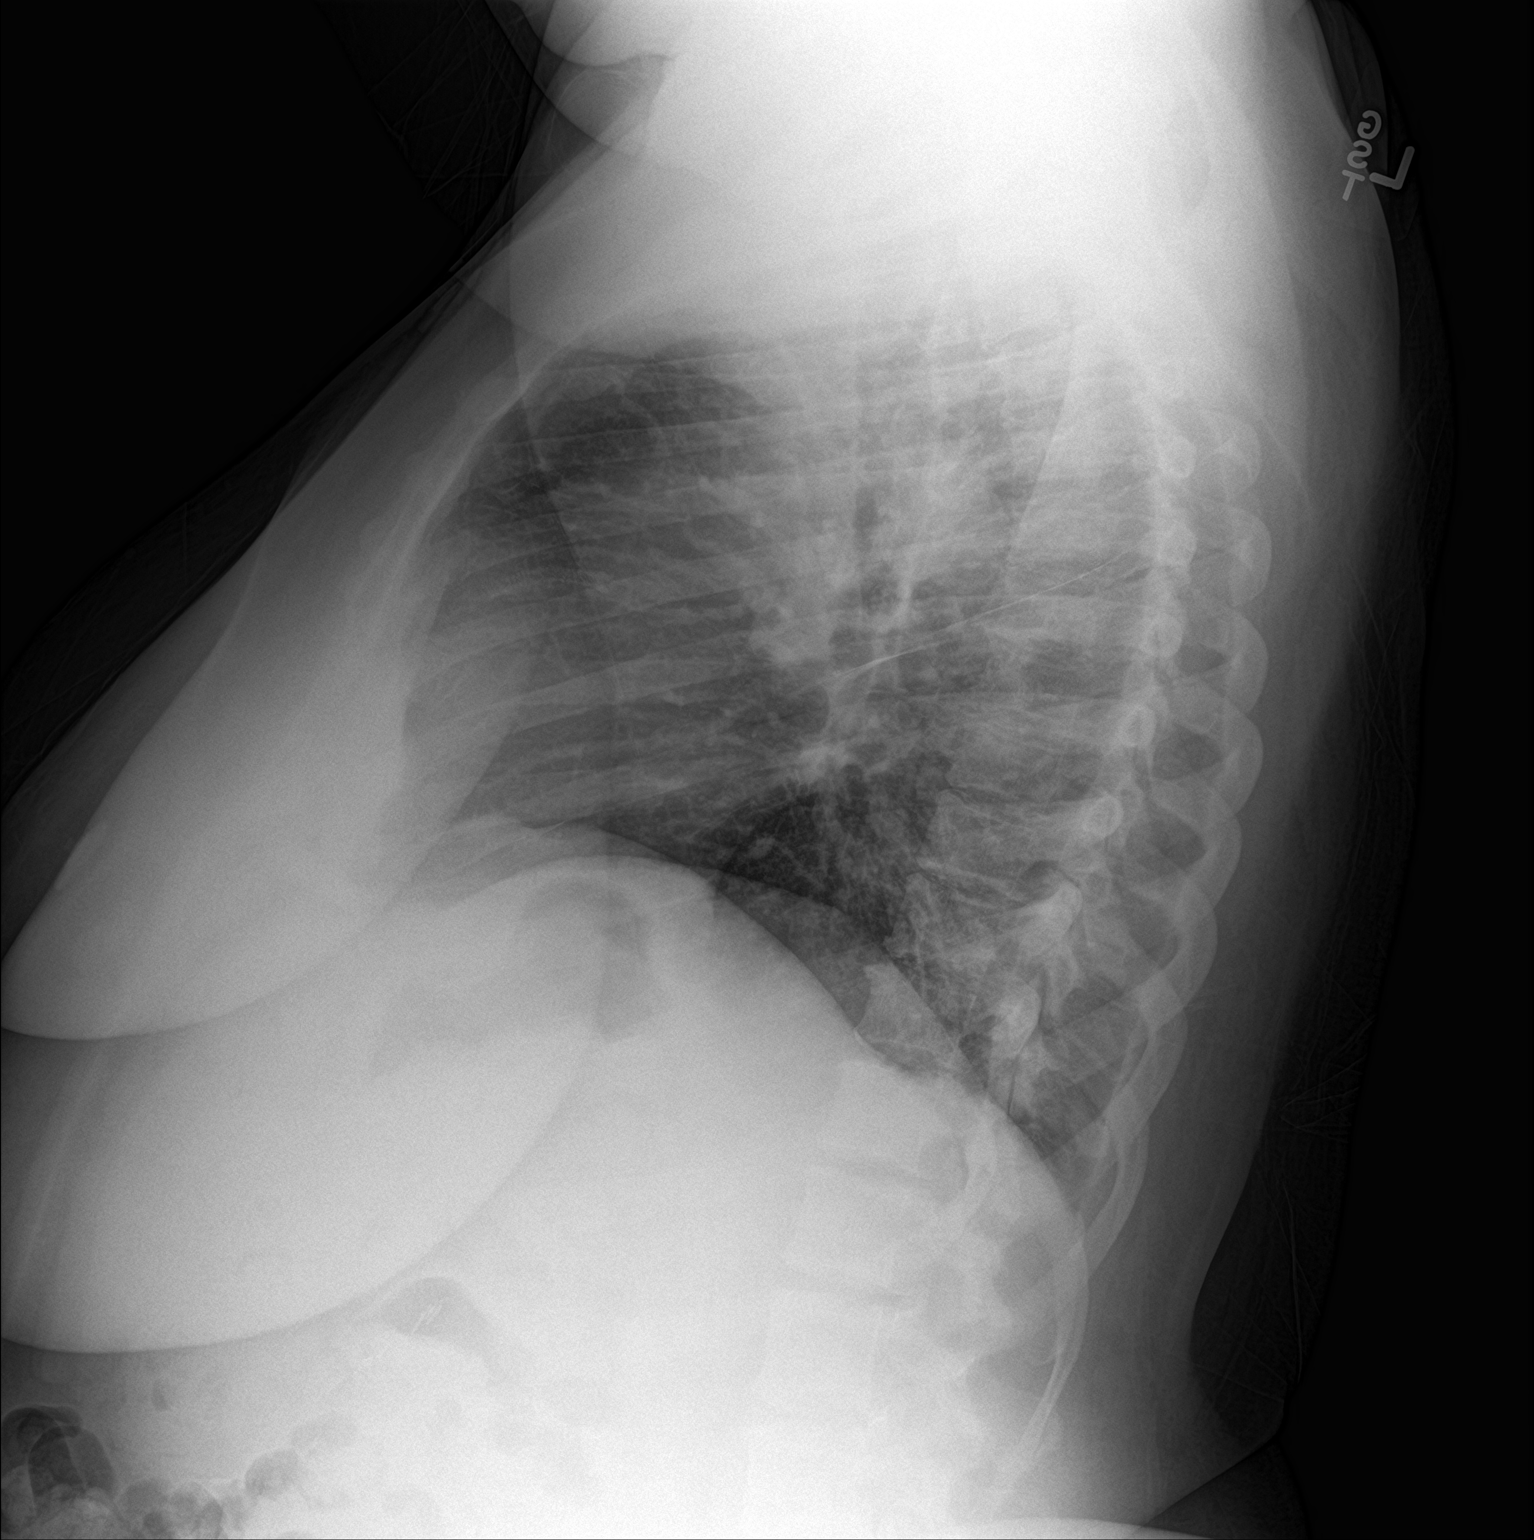

[2 of 2 positions shown; findings below may reference images not displayed]

FINDINGS: The lungs are well-aerated. Mild left basilar airspace opacity may
reflect atelectasis or possibly mild pneumonia. There is no evidence
of pleural effusion or pneumothorax.

The heart is normal in size; the mediastinal contour is within
normal limits. No acute osseous abnormalities are seen.
IMPRESSION: Mild left basilar airspace opacity may reflect atelectasis or
possibly mild pneumonia.

## 2019-03-22 ENCOUNTER — Other Ambulatory Visit: Payer: Self-pay | Admitting: Physician Assistant

## 2019-03-22 DIAGNOSIS — E1165 Type 2 diabetes mellitus with hyperglycemia: Secondary | ICD-10-CM

## 2019-03-22 DIAGNOSIS — IMO0002 Reserved for concepts with insufficient information to code with codable children: Secondary | ICD-10-CM

## 2019-03-22 DIAGNOSIS — I5032 Chronic diastolic (congestive) heart failure: Secondary | ICD-10-CM

## 2019-03-23 DIAGNOSIS — E1129 Type 2 diabetes mellitus with other diabetic kidney complication: Secondary | ICD-10-CM | POA: Diagnosis not present

## 2019-03-23 DIAGNOSIS — N186 End stage renal disease: Secondary | ICD-10-CM | POA: Diagnosis not present

## 2019-03-23 DIAGNOSIS — D631 Anemia in chronic kidney disease: Secondary | ICD-10-CM | POA: Diagnosis not present

## 2019-03-23 DIAGNOSIS — N2581 Secondary hyperparathyroidism of renal origin: Secondary | ICD-10-CM | POA: Diagnosis not present

## 2019-03-23 DIAGNOSIS — Z992 Dependence on renal dialysis: Secondary | ICD-10-CM | POA: Diagnosis not present

## 2019-03-23 DIAGNOSIS — D509 Iron deficiency anemia, unspecified: Secondary | ICD-10-CM | POA: Diagnosis not present

## 2019-03-24 ENCOUNTER — Other Ambulatory Visit (INDEPENDENT_AMBULATORY_CARE_PROVIDER_SITE_OTHER): Payer: Self-pay | Admitting: Vascular Surgery

## 2019-03-24 DIAGNOSIS — D509 Iron deficiency anemia, unspecified: Secondary | ICD-10-CM | POA: Diagnosis not present

## 2019-03-24 DIAGNOSIS — N186 End stage renal disease: Secondary | ICD-10-CM

## 2019-03-24 DIAGNOSIS — E1129 Type 2 diabetes mellitus with other diabetic kidney complication: Secondary | ICD-10-CM | POA: Diagnosis not present

## 2019-03-24 DIAGNOSIS — N2581 Secondary hyperparathyroidism of renal origin: Secondary | ICD-10-CM | POA: Diagnosis not present

## 2019-03-24 DIAGNOSIS — D631 Anemia in chronic kidney disease: Secondary | ICD-10-CM | POA: Diagnosis not present

## 2019-03-24 DIAGNOSIS — Z992 Dependence on renal dialysis: Secondary | ICD-10-CM | POA: Diagnosis not present

## 2019-03-27 ENCOUNTER — Other Ambulatory Visit: Payer: Self-pay

## 2019-03-27 ENCOUNTER — Encounter (INDEPENDENT_AMBULATORY_CARE_PROVIDER_SITE_OTHER): Payer: Self-pay | Admitting: Vascular Surgery

## 2019-03-27 ENCOUNTER — Ambulatory Visit (INDEPENDENT_AMBULATORY_CARE_PROVIDER_SITE_OTHER): Payer: Medicare Other

## 2019-03-27 ENCOUNTER — Ambulatory Visit (INDEPENDENT_AMBULATORY_CARE_PROVIDER_SITE_OTHER): Payer: Medicare Other | Admitting: Vascular Surgery

## 2019-03-27 VITALS — BP 161/66 | HR 93 | Resp 18 | Ht 67.0 in | Wt 287.0 lb

## 2019-03-27 DIAGNOSIS — N186 End stage renal disease: Secondary | ICD-10-CM

## 2019-03-27 DIAGNOSIS — E1165 Type 2 diabetes mellitus with hyperglycemia: Secondary | ICD-10-CM | POA: Diagnosis not present

## 2019-03-27 DIAGNOSIS — I7121 Aneurysm of the ascending aorta, without rupture: Secondary | ICD-10-CM | POA: Insufficient documentation

## 2019-03-27 DIAGNOSIS — E118 Type 2 diabetes mellitus with unspecified complications: Secondary | ICD-10-CM | POA: Diagnosis not present

## 2019-03-27 DIAGNOSIS — Z992 Dependence on renal dialysis: Secondary | ICD-10-CM | POA: Diagnosis not present

## 2019-03-27 DIAGNOSIS — I2510 Atherosclerotic heart disease of native coronary artery without angina pectoris: Secondary | ICD-10-CM

## 2019-03-27 DIAGNOSIS — I10 Essential (primary) hypertension: Secondary | ICD-10-CM

## 2019-03-27 DIAGNOSIS — I712 Thoracic aortic aneurysm, without rupture: Secondary | ICD-10-CM | POA: Diagnosis not present

## 2019-03-27 DIAGNOSIS — T829XXD Unspecified complication of cardiac and vascular prosthetic device, implant and graft, subsequent encounter: Secondary | ICD-10-CM

## 2019-03-27 DIAGNOSIS — IMO0002 Reserved for concepts with insufficient information to code with codable children: Secondary | ICD-10-CM

## 2019-03-27 MED ORDER — LIDOCAINE-PRILOCAINE 2.5-2.5 % EX CREA: 1.0000 "application " | TOPICAL_CREAM | CUTANEOUS | 11 refills | Status: DC | PRN

## 2019-03-27 NOTE — Progress Notes (Signed)
MRN : [id number]  [name] is a 63 y.o. (May 15, 1956) female who presents with chief complaint of  Chief Complaint  Patient presents with  . Follow-up    ultrasound  .  History of Present Illness:  The patient is seen for evaluation of dialysis access.  The patient has a history of postoperative wound dehiscence.  It is taken many months but her wound is now completely healed.  Current access is via a catheter which is functioning poorly.  There have been several episodes of catheter infection.  Patient also has a history of an ascending aortic aneurysm.  She is status post CT scan.  I have personally reviewed the CT scan and her a sending aorta measures 3.6 cm.  She denies back or chest pain.  The patient denies amaurosis fugax or recent TIA symptoms. There are no recent neurological changes noted. The patient denies claudication symptoms or rest pain symptoms. The patient denies history of DVT, PE or superficial thrombophlebitis. The patient denies recent episodes of angina or shortness of breath.   Duplex ultrasound of the AV access shows a patent access with uniform velocities.  No focal hemodynamically significant stenosis is dentified.   Current Meds  Medication Sig  . Accu-Chek FastClix Lancets MISC USE AS DIRECTED TO CHECK BLOOD SUGAR THREE TIMES DAILY AND AT BEDTIME  . albuterol (PROAIR HFA) 108 (90 Base) MCG/ACT inhaler INHALE 2 PUFFS BY MOUTH EVERY 4 HOURS AS NEEDED FOR SHORTNESS OF BREATH  . allopurinol (ZYLOPRIM) 100 MG tablet Take 1 tablet (100 mg total) by mouth every morning.  [name] aspirin EC 81 MG EC tablet Take 1 tablet (81 mg total) by mouth daily.  [name] atorvastatin (LIPITOR) 80 MG tablet TAKE 1 TABLET BY MOUTH ONCE DAILY AT 6PM  . AURYXIA 1 GM 210 MG(Fe) tablet Take 420 mg by mouth 2 (two) times daily with a meal.   . carvedilol (COREG) 25 MG tablet TAKE (1) TABLET BY MOUTH TWICE A DAY WITH MEALS (BREAKFAST AND SUPPER)  . cyclobenzaprine (FLEXERIL) 5 MG tablet  Take 1 tablet (5 mg total) by mouth 3 (three) times daily as needed for muscle spasms.  [name] docusate sodium (COLACE) 100 MG capsule Take 1 capsule (100 mg total) by mouth 2 (two) times daily as needed for mild constipation.  . fluticasone (FLONASE) 50 MCG/ACT nasal spray Place 2 sprays into both nostrils daily as needed for allergies or rhinitis.  . hydrALAZINE (APRESOLINE) 100 MG tablet TAKE (1) TABLET BY MOUTH THREE TIMES DAILY  . hydrOXYzine (ATARAX/VISTARIL) 25 MG tablet Take 1 tablet (25 mg total) by mouth every 6 (six) hours as needed for itching.  [name] LEVEMIR FLEXTOUCH 100 UNIT/ML Pen Inject 60 Units into the skin 2 (two) times a day.  . liraglutide (VICTOZA) 18 MG/3ML SOPN Start with 0.80m daily and increase by 0.682mper week until max dose of 1.8 mg dose achieved.  . loratadine (CLARITIN) 10 MG tablet TAKE 1 TABLET BY MOUTH ONCE DAILY.  . mometasone (ELOCON) 0.1 % cream APPLY AS DIRECTED TO AFFECTED AREA ONCE DAILY.  . mometasone-formoterol (DULERA) 200-5 MCG/ACT AERO Inhale 2 puffs into the lungs 2 (two) times daily.  . montelukast (SINGULAIR) 10 MG tablet TAKE ONE TABLET BY MOUTH AT BEDTIME.  . nitroGLYCERIN (NITROSTAT) 0.4 MG SL tablet Place 1 tablet (0.4 mg total) under the tongue every 5 (five) minutes x 3 doses as needed for chest pain.  . Marland KitchenOVOLOG FLEXPEN 100 UNIT/ML FlexPen INJECT 25 UNITS S.Q. THREE  TIMES A DAY WITH MEALS.  [name] omeprazole (PRILOSEC) 20 MG capsule TAKE (1) CAPSULE BY MOUTH ONCE DAILY.  [name] pregabalin (LYRICA) 75 MG capsule Take 1 capsule (75 mg total) by mouth 2 (two) times daily.  . SENNA PLUS 8.6-50 MG tablet TAKE ONE TABLET BY MOUTH AT BEDTIME.  . silver sulfADIAZINE (SILVADENE) 1 % cream Apply 1 application topically daily. Apply to right first toe  . torsemide (DEMADEX) 20 MG tablet TAKE (2) TABLETS BY MOUTH TWICE DAILY.  . traMADol (ULTRAM) 50 MG tablet One to Two Tabs Every Six Hours as Needed For Pain  . triamcinolone cream (KENALOG) 0.1 % APPLY TO AFFECTED AREA  TWICE DAILY  . ULTICARE SHORT PEN NEEDLES 31G X 8 MM MISC USE THREE TIMES DAILY WITH INSULIN  . valACYclovir (VALTREX) 500 MG tablet TAKE (1) TABLET BY MOUTH EVERY OTHER DAY.  [name] Vitamin D, Ergocalciferol, (DRISDOL) 1.25 MG (50000 UT) CAPS capsule TAKE 1 CAPSULE BY MOUTH ONCE A MONTH    Past Medical History:  Diagnosis Date  . Anemia   . Aortic stenosis    Echo 8/18: mean 13, peak 28, LVOT/AV mean velocity 0.51  . Arthritis   . Asthma    As a child   . Bronchitis   . CAD (coronary artery disease)    a. 09/2016: 50% Ost 1st Mrg stenosis, 50% 2nd Mrg stenosis, 20% Mid-Cx, 95% Prox LAD, 40% mid-LAD, and 10% dist-LAD stenosis. Staged PCI with DES to Prox-LAD.   [name] Chronic combined systolic and diastolic CHF (congestive heart failure) ([place]) 2011   echo 2/18: EF 55-60, normal wall motion, grade 2 diastolic dysfunction, trivial AI // echo 3/18: Septal and apical HK, EF 45-50, normal wall motion, trivial AI, mild LAE, PASP 38 // echo 8/18: EF 60-65, normal wall motion, grade 1 diastolic dysfunction, calcified aortic valve leaflets, mild aortic stenosis (mean 13, peak 28, LVOT/AV mean velocity 0.51), mild AI, moderate MAC, mild LAE, trivial TR   . Chronic kidney disease    STAGE 4  . Chronic kidney disease on chronic dialysis (HCC)    t, th, sat  . Complication of anesthesia   . Depression   . Diabetes mellitus Dx 1989  . Elevated lipids   . GERD (gastroesophageal reflux disease)   . Gout   . Heart murmur    asymptomatic  . Hepatitis C Dx 2013  . Hypertension Dx 1989  . Infected surgical wound    Lt arm  . Myocardial infarction ([place]) 07/2015  . Obesity   . Pancreatitis 2013  . Pneumonia   . Refusal of blood transfusions as patient is Jehovah's Witness   . Tendinitis   . Tremors of nervous system    LEFT HAND  . Ulcer 2010    Past Surgical History:  Procedure Laterality Date  . APPLICATION OF WOUND VAC Left 08/14/2017   Procedure: APPLICATION OF WOUND VAC Exchange;  Surgeon:  Robert Bellow, MD;  Location: [facility] ORS;  Service: General;  Laterality: Left;  . APPLICATION OF WOUND VAC Left 12/21/2018   Procedure: APPLICATION OF WOUND VAC;  Surgeon: Katha Cabal, MD;  Location: [facility] ORS;  Service: Vascular;  Laterality: Left;  . AV FISTULA PLACEMENT Left 08/19/2018   Procedure: ARTERIOVENOUS (AV) FISTULA CREATION ( BRACHIOBASILIC );  Surgeon: Katha Cabal, MD;  Location: [facility] ORS;  Service: Vascular;  Laterality: Left;  . BASCILIC VEIN TRANSPOSITION Left 11/18/2018   Procedure: BASCILIC VEIN TRANSPOSITION;  Surgeon: Katha Cabal, MD;  Location: [facility]  ORS;  Service: Vascular;  Laterality: Left;  . CHOLECYSTECTOMY    . COLONOSCOPY WITH PROPOFOL N/A 02/03/2018   Procedure: COLONOSCOPY WITH PROPOFOL;  Surgeon: Lin Landsman, MD;  Location: [facility] ENDOSCOPY;  Service: Gastroenterology;  Laterality: N/A;  . CORONARY ANGIOPLASTY  07/2015   STENT  . CORONARY STENT INTERVENTION N/A 09/18/2016   Procedure: Coronary Stent Intervention;  Surgeon: Troy Sine, MD;  Location: [place] CV LAB;  Service: Cardiovascular;  Laterality: N/A;  . DIALYSIS/PERMA CATHETER INSERTION N/A 05/10/2018   Procedure: DIALYSIS/PERMA CATHETER INSERTION;  Surgeon: Katha Cabal, MD;  Location: [place] CV LAB;  Service: Cardiovascular;  Laterality: N/A;  . DRESSING CHANGE UNDER ANESTHESIA Left 08/15/2017   Procedure: exploration of wound for bleeding;  Surgeon: Robert Bellow, MD;  Location: [facility] ORS;  Service: General;  Laterality: Left;  . ESOPHAGOGASTRODUODENOSCOPY (EGD) WITH PROPOFOL N/A 02/03/2018   Procedure: ESOPHAGOGASTRODUODENOSCOPY (EGD) WITH PROPOFOL;  Surgeon: Lin Landsman, MD;  Location: [facility] ENDOSCOPY;  Service: Gastroenterology;  Laterality: N/A;  . EYE SURGERY  11/17/2018  . INCISION AND DRAINAGE ABSCESS Left 08/12/2017   Procedure: INCISION AND DRAINAGE ABSCESS;  Surgeon: Robert Bellow, MD;  Location: [facility] ORS;  Service: General;  Laterality:  Left;  . KNEE ARTHROSCOPY    . LEFT HEART CATH N/A 09/18/2016   Procedure: Left Heart Cath;  Surgeon: Troy Sine, MD;  Location: [place] CV LAB;  Service: Cardiovascular;  Laterality: N/A;  . LEFT HEART CATH AND CORONARY ANGIOGRAPHY N/A 09/16/2016   Procedure: Left Heart Cath and Coronary Angiography;  Surgeon: Burnell Blanks, MD;  Location: [place] [facility] LAB;  Service: Cardiovascular;  Laterality: N/A;  . LEFT HEART CATH AND CORONARY ANGIOGRAPHY N/A 04/29/2017   Procedure: LEFT HEART CATH AND CORONARY ANGIOGRAPHY;  Surgeon: Nelva Bush, MD;  Location: [place] CV LAB;  Service: Cardiovascular;  Laterality: N/A;  . LOWER EXTREMITY ANGIOGRAPHY Right 03/08/2018   Procedure: LOWER EXTREMITY ANGIOGRAPHY;  Surgeon: Katha Cabal, MD;  Location: [place] CV LAB;  Service: Cardiovascular;  Laterality: Right;  . TUBAL LIGATION    . TUBAL LIGATION    . WOUND DEBRIDEMENT Left 12/21/2018   Procedure: DEBRIDEMENT WOUND;  Surgeon: Katha Cabal, MD;  Location: [facility] ORS;  Service: Vascular;  Laterality: Left;  . WOUND DEBRIDEMENT Left 12/30/2018   Procedure: DEBRIDEMENT WOUND WITH VAC PLACEMENT (LEFT UPPER EXTREMITY);  Surgeon: Katha Cabal, MD;  Location: [facility] ORS;  Service: Vascular;  Laterality: Left;    Social History Social History   Tobacco Use  . Smoking status: Former Smoker    Packs/day: 0.25    Years: 6.00    Pack years: 1.50    Types: Cigarettes    Quit date: 10/25/1980    Years since quitting: 38.4  . Smokeless tobacco: Never Used  Substance Use Topics  . Alcohol use: Yes    Comment: rarely  . Drug use: Not Currently    Types: "Crack" cocaine, Marijuana    Comment: 08/21/2016 "clean since 05/1998"    Family History Family History  Problem Relation Age of Onset  . Colon cancer Mother   . Heart attack Other   . Heart attack Maternal Grandmother   . Hypertension Sister   . Hypertension Brother   . Diabetes Paternal Grandmother   . Breast  cancer Neg Hx     Allergies  Allergen Reactions  . Shellfish Allergy Anaphylaxis and Swelling  . Diazepam Other (See Comments)    "felt like out  of body experience"     REVIEW OF SYSTEMS (Negative unless checked)  Constitutional: _0 Weight loss  _1 Fever  _2 Chills Cardiac: _3 Chest pain   _4 Chest pressure   _5 Palpitations   _6 Shortness of breath when laying flat   _7 Shortness of breath with exertion. Vascular:  _8 Pain in legs with walking   _9 Pain in legs at rest  _10 History of DVT   _11 Phlebitis   _12 Swelling in legs   _13 Varicose veins   _14 Non-healing ulcers Pulmonary:   _15 Uses home oxygen   _16 Productive cough   _17 Hemoptysis   _18 Wheeze  _19 COPD   _20 Asthma Neurologic:  _21 Dizziness   _22 Seizures   _23 History of stroke   _24 History of TIA  _25 Aphasia   _26 Vissual changes   _27 Weakness or numbness in arm   _28 Weakness or numbness in leg Musculoskeletal:   _29 Joint swelling   _30 Joint pain   _31 Low back pain Hematologic:  _32 Easy bruising  _33 Easy bleeding   _34 Hypercoagulable state   _35 Anemic Gastrointestinal:  _36 Diarrhea   _37 Vomiting  _38 Gastroesophageal reflux/heartburn   _39 Difficulty swallowing. Genitourinary:  _40 Chronic kidney disease   _41 Difficult urination  _42 Frequent urination   _43 Blood in urine Skin:  _44 Rashes   _45 Ulcers  Psychological:  _46 History of anxiety   _47  History of major depression.  Physical Examination  Vitals:   03/27/19 1439  BP: (!) 161/66  Pulse: 93  Resp: 18  Weight: 287 lb (130.2 kg)  Height: _48  (1.702 m)   Body mass index is 44.95 kg/m. Gen: WD/WN, NAD Head: [place]/AT, No temporalis wasting.  Ear/Nose/Throat: Hearing grossly intact, nares w/o erythema or drainage Eyes: PER, EOMI, sclera nonicteric.  Neck: Supple, no large masses.   Pulmonary:  Good air movement, no audible wheezing bilaterally, no use of accessory muscles.  Cardiac: RRR, no JVD Vascular: Left arm is now healed.  Fistula is palpable good thrill good bruit Vessel Right Left  Radial Palpable Palpable   Brachial Palpable Palpable  Gastrointestinal: Non-distended. No guarding/no peritoneal signs.  Musculoskeletal: M/S 5/5 throughout.  No deformity or atrophy.  Neurologic: CN 2-12 intact. Symmetrical.  Speech is fluent. Motor exam as listed above. Psychiatric: Judgment intact, Mood & affect appropriate for pt's clinical situation. Dermatologic: No rashes or ulcers noted.  No changes consistent with cellulitis. Lymph : No lichenification or skin changes of chronic lymphedema.  CBC Lab Results  Component Value Date   WBC 8.8 01/02/2019   HGB 9.0 (L) 01/02/2019   HCT 29.0 (L) 01/02/2019   MCV 94.5 01/02/2019   PLT 201 01/02/2019    BMET    Component Value Date/Time   NA 135 01/02/2019 0638   NA 142 01/27/2018 1353   K 4.5 01/02/2019 0638   CL 96 (L) 01/02/2019 0638   CO2 28 01/02/2019 0638   GLUCOSE 226 (H) 01/02/2019 0638   BUN 63 (H) 01/02/2019 0638   BUN 82 (HH) 01/27/2018 1353   CREATININE 6.23 (H) 01/02/2019 0638   CREATININE 1.36 (H) 09/23/2016 1451   CALCIUM 8.3 (L) 01/02/2019 0638   GFRNONAA 7 (L) 01/02/2019 0638   GFRNONAA 43 (L) 09/23/2016 1451   GFRAA 8 (L) 01/02/2019 8299   GFRAA 49 (L) 09/23/2016 1451   CrCl cannot be calculated (Patient's most recent lab result is older than the maximum 21 days allowed.).  COAG Lab Results  Component Value Date   INR 1.0 12/30/2018   INR 1.0 12/20/2018   INR 0.9 11/18/2018    Radiology Ct Angio Chest Aorta W/cm &/or Wo/cm  Result Date: 03/14/2019 CLINICAL DATA:  Vascular access.  Aortic aneurysm.  Smoking history. EXAM: CT ANGIOGRAPHY CHEST WITH CONTRAST TECHNIQUE: Multidetector CT imaging of the chest was performed using the standard protocol during bolus administration of intravenous contrast. Multiplanar CT image reconstructions and MIPs were obtained to evaluate the vascular anatomy. CONTRAST:  60m OMNIPAQUE IOHEXOL 350 MG/ML SOLN COMPARISON:  Radiography 12/29/2018.  Chest CT 02/16/2015 FINDINGS: Cardiovascular: Dual  lumen catheter enters the right internal jugular vein. Tip cannot be evaluated because of contrast infusion. Typical mixing pattern in the SVC. Cannot rule out some thrombus associated with the catheter but this is not certain. Pulmonary arterial opacification is only moderate. I do not see any definable pulmonary emboli. There is aortic atherosclerosis. No aneurysm. Maximal diameter of the ascending aorta is 3.6 cm. There is coronary artery calcification and left system stent. Mediastinum/Nodes: No mass or lymphadenopathy. Lungs/Pleura: Upper lobes are clear except for a 3 mm density in the lateral right upper lobe image 43 which is unchanged since 22016/03/11 there is chronic linear scarring in both lower lobes, slightly more pronounced than seen in 203/11/16 This is presumed represent chronic scarring, but mild active atelectasis can not be excluded. No pleural fluid. Upper Abdomen: Negative.  Previous cholecystectomy. Musculoskeletal: Ordinary thoracic degenerative changes. Multiple endplate Schmorl's nodes. Review of the MIP images confirms the above findings. IMPRESSION: No pulmonary emboli seen. Pulmonary arterial opacification is only moderate. Right internal jugular catheter in place. SVC is patent. Cannot rule out the possibility of some thrombus associated with the distal catheter, but this is not a reliable finding based on the acute infusion and potential for mixing phenomenon. Maximal diameter of the ascending aorta 3.6 cm. Coronary artery calcification and left system stent. Linear scarring at both lung bases. Cannot rule out an element of atelectasis, but scarring is favored. Electronically Signed   By: MNelson ChimesM.D.   On: 03/14/2019 16:23     Assessment/Plan 1. Complication from renal dialysis device, subsequent encounter Recommend:  The patient is experiencing increasing problems with their dialysis access.  Patient should have a fistulagram with the intention for intervention.  The intention  for intervention is to restore appropriate flow and prevent thrombosis and possible loss of the access.  As well as improve the quality of dialysis therapy.  The risks, benefits and alternative therapies were reviewed in detail with the patient.  All questions were answered.  The patient agrees to proceed with angio/intervention.      2. ESRD on dialysis ([facility] Continue dialysis without interuption  3. Ascending aortic aneurysm ([place] I have personally reviewed the CT scan.  Her a sending aortic aneurysm is very small.  There is no indication for surgery or repair at this time.  We will continue to monitor with CT scans every other year.   4. Coronary artery disease involving native coronary artery of native heart without angina pectoris Continue cardiac and antihypertensive medications as already ordered and reviewed, no changes at this time.  Continue statin as ordered and reviewed, no changes at this time  Nitrates PRN for chest pain   5. Essential hypertension Continue antihypertensive medications as already ordered, these medications have been reviewed and there are no changes at this time.   6. Diabetes mellitus type 2, uncontrolled, with complications ([place] Continue hypoglycemic medications as already ordered, these medications have been reviewed and there are no changes at this time.  Hgb A1C to be monitored as already arranged by primary service     GHortencia Pilar MD  03/27/2019 2:45  PM

## 2019-03-28 DIAGNOSIS — E1129 Type 2 diabetes mellitus with other diabetic kidney complication: Secondary | ICD-10-CM | POA: Diagnosis not present

## 2019-03-28 DIAGNOSIS — Z992 Dependence on renal dialysis: Secondary | ICD-10-CM | POA: Diagnosis not present

## 2019-03-28 DIAGNOSIS — D509 Iron deficiency anemia, unspecified: Secondary | ICD-10-CM | POA: Diagnosis not present

## 2019-03-28 DIAGNOSIS — D631 Anemia in chronic kidney disease: Secondary | ICD-10-CM | POA: Diagnosis not present

## 2019-03-28 DIAGNOSIS — N186 End stage renal disease: Secondary | ICD-10-CM | POA: Diagnosis not present

## 2019-03-28 DIAGNOSIS — N2581 Secondary hyperparathyroidism of renal origin: Secondary | ICD-10-CM | POA: Diagnosis not present

## 2019-03-29 ENCOUNTER — Ambulatory Visit: Payer: Medicare Other | Attending: Infectious Diseases

## 2019-03-29 ENCOUNTER — Telehealth: Payer: Self-pay | Admitting: Licensed Clinical Social Worker

## 2019-03-29 ENCOUNTER — Telehealth (INDEPENDENT_AMBULATORY_CARE_PROVIDER_SITE_OTHER): Payer: Self-pay

## 2019-03-29 NOTE — Telephone Encounter (Signed)
Spoke with the patient and she is now scheduled for a  fistulagram with Dr. Delana Meyer on 04/05/2019 with a 12:00 pm arrival time to the [facility]. Patient will do her Covid testing on 03/31/2019 between 12:30-2:30 pm at the [facility].

## 2019-03-29 NOTE — Telephone Encounter (Signed)
Spoke to [employer] in ultrasound and the patient did not show for her appointment today. I will try to reach out to the patient.

## 2019-03-30 DIAGNOSIS — D631 Anemia in chronic kidney disease: Secondary | ICD-10-CM | POA: Diagnosis not present

## 2019-03-30 DIAGNOSIS — D509 Iron deficiency anemia, unspecified: Secondary | ICD-10-CM | POA: Diagnosis not present

## 2019-03-30 DIAGNOSIS — N186 End stage renal disease: Secondary | ICD-10-CM | POA: Diagnosis not present

## 2019-03-30 DIAGNOSIS — N2581 Secondary hyperparathyroidism of renal origin: Secondary | ICD-10-CM | POA: Diagnosis not present

## 2019-03-30 DIAGNOSIS — E1129 Type 2 diabetes mellitus with other diabetic kidney complication: Secondary | ICD-10-CM | POA: Diagnosis not present

## 2019-03-30 DIAGNOSIS — Z992 Dependence on renal dialysis: Secondary | ICD-10-CM | POA: Diagnosis not present

## 2019-03-30 NOTE — Telephone Encounter (Signed)
Patient left a message for a return call and if I could to leave a message. Patient wanted to clarify her arrival time to the [facility]. I attempted to contact the patient and was unable to speak with anyone or leave a message due to the mailbox being full.

## 2019-03-31 ENCOUNTER — Other Ambulatory Visit: Admission: RE | Admit: 2019-03-31 | Payer: Medicare Other | Source: Ambulatory Visit

## 2019-04-01 DIAGNOSIS — E1129 Type 2 diabetes mellitus with other diabetic kidney complication: Secondary | ICD-10-CM | POA: Diagnosis not present

## 2019-04-01 DIAGNOSIS — Z992 Dependence on renal dialysis: Secondary | ICD-10-CM | POA: Diagnosis not present

## 2019-04-01 DIAGNOSIS — N2581 Secondary hyperparathyroidism of renal origin: Secondary | ICD-10-CM | POA: Diagnosis not present

## 2019-04-01 DIAGNOSIS — D631 Anemia in chronic kidney disease: Secondary | ICD-10-CM | POA: Diagnosis not present

## 2019-04-01 DIAGNOSIS — D509 Iron deficiency anemia, unspecified: Secondary | ICD-10-CM | POA: Diagnosis not present

## 2019-04-01 DIAGNOSIS — N186 End stage renal disease: Secondary | ICD-10-CM | POA: Diagnosis not present

## 2019-04-01 IMAGING — DX DG CHEST 1V PORT
1 series · 1 of 1 positions shown · non-contrast
Comparison: 12/01/2016

CLINICAL DATA: Shortness of breath today

EXAM:
PORTABLE CHEST 1 VIEW

[chest ap]
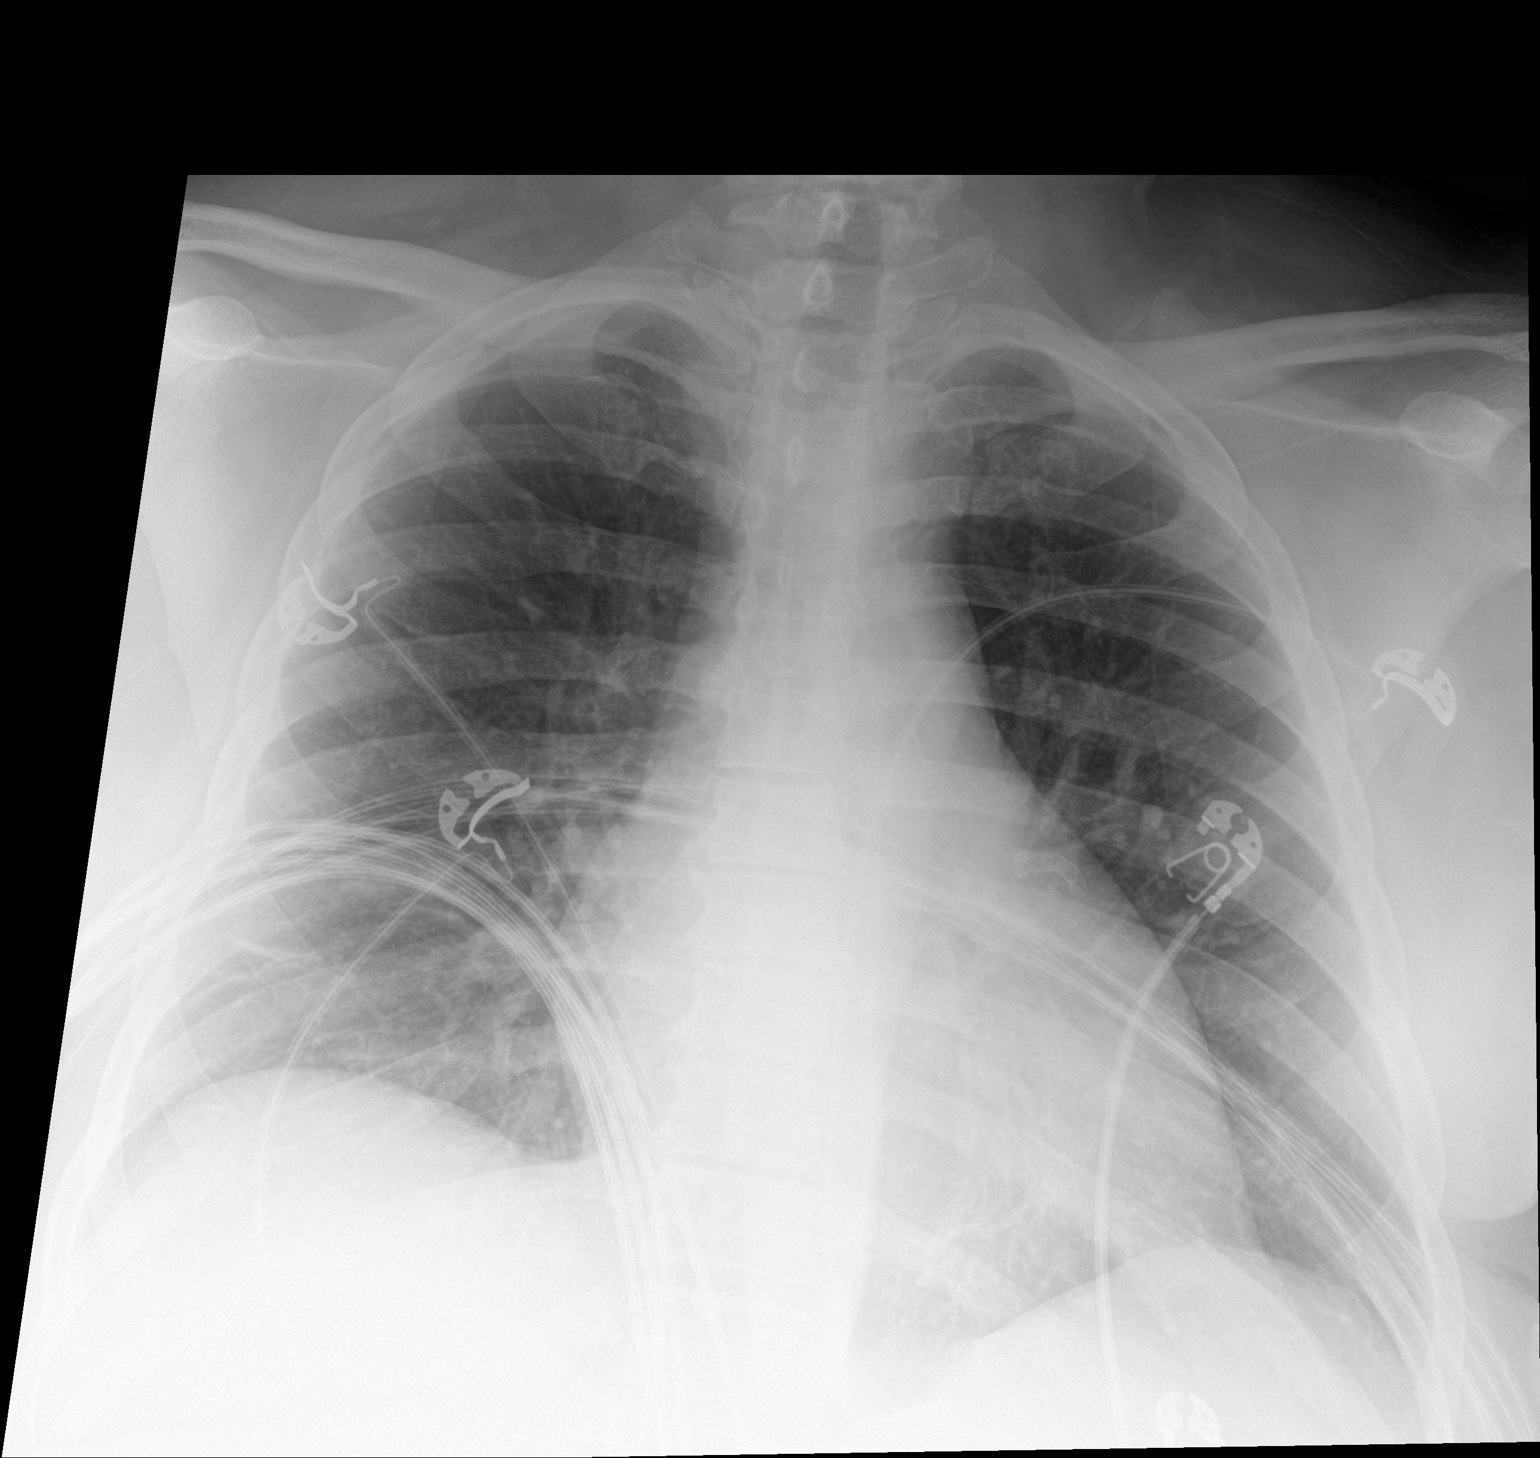

[1 of 1 positions shown; findings below may reference images not displayed]

FINDINGS: Mild cardiomegaly. A coronary stent is noted. Minimal linear
atelectasis at the right base. No focal consolidation or pleural
effusion. No pneumothorax.
IMPRESSION: Mild cardiomegaly.  No edema or infiltrate.

## 2019-04-04 ENCOUNTER — Other Ambulatory Visit
Admission: RE | Admit: 2019-04-04 | Discharge: 2019-04-04 | Disposition: A | Discharge status: Home / Self Care | Payer: Medicare Other | Source: Ambulatory Visit | Attending: Infectious Diseases | Admitting: Infectious Diseases

## 2019-04-04 ENCOUNTER — Encounter (INDEPENDENT_AMBULATORY_CARE_PROVIDER_SITE_OTHER): Payer: Self-pay

## 2019-04-04 ENCOUNTER — Other Ambulatory Visit: Admission: RE | Admit: 2019-04-04 | Payer: Medicare Other | Source: Ambulatory Visit

## 2019-04-04 ENCOUNTER — Other Ambulatory Visit (INDEPENDENT_AMBULATORY_CARE_PROVIDER_SITE_OTHER): Payer: Self-pay | Admitting: Nurse Practitioner

## 2019-04-04 DIAGNOSIS — N186 End stage renal disease: Secondary | ICD-10-CM | POA: Diagnosis not present

## 2019-04-04 DIAGNOSIS — Z992 Dependence on renal dialysis: Secondary | ICD-10-CM | POA: Diagnosis not present

## 2019-04-04 DIAGNOSIS — D631 Anemia in chronic kidney disease: Secondary | ICD-10-CM | POA: Diagnosis not present

## 2019-04-04 DIAGNOSIS — D509 Iron deficiency anemia, unspecified: Secondary | ICD-10-CM | POA: Diagnosis not present

## 2019-04-04 DIAGNOSIS — B182 Chronic viral hepatitis C: Secondary | ICD-10-CM | POA: Diagnosis not present

## 2019-04-04 DIAGNOSIS — E1129 Type 2 diabetes mellitus with other diabetic kidney complication: Secondary | ICD-10-CM | POA: Diagnosis not present

## 2019-04-04 DIAGNOSIS — N2581 Secondary hyperparathyroidism of renal origin: Secondary | ICD-10-CM | POA: Diagnosis not present

## 2019-04-04 LAB — BASIC METABOLIC PANEL
Anion gap: 14 (ref 5–15)
BUN: 34 mg/dL — ABNORMAL HIGH (ref 8–23)
CO2: 27 mmol/L (ref 22–32)
Calcium: 8.2 mg/dL — ABNORMAL LOW (ref 8.9–10.3)
Chloride: 92 mmol/L — ABNORMAL LOW (ref 98–111)
Creatinine, Ser: 3.59 mg/dL — ABNORMAL HIGH (ref 0.44–1.00)
GFR calc Af Amer: 15 mL/min — ABNORMAL LOW (ref >60)
GFR calc non Af Amer: 13 mL/min — ABNORMAL LOW (ref >60)
Glucose, Bld: 420 mg/dL — ABNORMAL HIGH (ref 70–99)
Potassium: 3.2 mmol/L — ABNORMAL LOW (ref 3.5–5.1)
Sodium: 133 mmol/L — ABNORMAL LOW (ref 135–145)

## 2019-04-04 LAB — HEPATIC FUNCTION PANEL
ALT: 56 U/L — ABNORMAL HIGH (ref 0–44)
AST: 67 U/L — ABNORMAL HIGH (ref 15–41)
Albumin: 3.5 g/dL (ref 3.5–5.0)
Alkaline Phosphatase: 403 U/L — ABNORMAL HIGH (ref 38–126)
Bilirubin, Direct: 0.1 mg/dL (ref 0.0–0.2)
Indirect Bilirubin: 0.8 mg/dL (ref 0.3–0.9)
Total Bilirubin: 0.9 mg/dL (ref 0.3–1.2)
Total Protein: 7.6 g/dL (ref 6.5–8.1)

## 2019-04-04 LAB — CBC WITH DIFFERENTIAL/PLATELET
Abs Immature Granulocytes: 0.09 10*3/uL — ABNORMAL HIGH (ref 0.00–0.07)
Basophils Absolute: 0 10*3/uL (ref 0.0–0.1)
Basophils Relative: 1 %
Eosinophils Absolute: 0.2 10*3/uL (ref 0.0–0.5)
Eosinophils Relative: 3 %
HCT: 33.4 % — ABNORMAL LOW (ref 36.0–46.0)
Hemoglobin: 10.9 g/dL — ABNORMAL LOW (ref 12.0–15.0)
Immature Granulocytes: 1 %
Lymphocytes Relative: 16 %
Lymphs Abs: 1 10*3/uL (ref 0.7–4.0)
MCH: 30.1 pg (ref 26.0–34.0)
MCHC: 32.6 g/dL (ref 30.0–36.0)
MCV: 92.3 fL (ref 80.0–100.0)
Monocytes Absolute: 0.5 10*3/uL (ref 0.1–1.0)
Monocytes Relative: 7 %
Neutro Abs: 4.8 10*3/uL (ref 1.7–7.7)
Neutrophils Relative %: 72 %
Platelets: 171 10*3/uL (ref 150–400)
RBC: 3.62 MIL/uL — ABNORMAL LOW (ref 3.87–5.11)
RDW: 15.6 % — ABNORMAL HIGH (ref 11.5–15.5)
WBC: 6.7 10*3/uL (ref 4.0–10.5)
nRBC: 0 % (ref 0.0–0.2)

## 2019-04-04 LAB — APTT: aPTT: 29 seconds (ref 24–36)

## 2019-04-04 LAB — PROTIME-INR
INR: 0.9 (ref 0.8–1.2)
Prothrombin Time: 12.3 seconds (ref 11.4–15.2)

## 2019-04-04 NOTE — Telephone Encounter (Signed)
Patient has called and left a message. I returned the call and left a message for a return call. Patient called back 10 mins later, I was on the phone and I returned the call once I finished with another patient. I was unable to leave a message this time her voicemail box was full.

## 2019-04-04 NOTE — Addendum Note (Signed)
Addended by: Clarisa Kindred on: 04/04/2019 12:29 PM   Modules accepted: Orders

## 2019-04-04 NOTE — Telephone Encounter (Signed)
Patient called wanting to reschedule her procedure from today 04/04/2019 to 04/11/2019 with Dr. Delana Meyer. Patient has 10:30 am arrival time to the [facility]. Patient will do Covid testing on 04/07/2019 between 12:30-2:330 pm at the [facility].

## 2019-04-05 LAB — HEPATITIS B SURFACE ANTIBODY, QUANTITATIVE: Hep B S AB Quant (Post): 61.9 m[IU]/mL (ref >9.9)

## 2019-04-05 LAB — AFP TUMOR MARKER: AFP, Serum, Tumor Marker: 6.9 ng/mL (ref 0.0–8.3)

## 2019-04-05 LAB — HEPATITIS B SURFACE ANTIGEN: Hepatitis B Surface Ag: NEGATIVE

## 2019-04-06 ENCOUNTER — Other Ambulatory Visit: Payer: Self-pay

## 2019-04-06 ENCOUNTER — Ambulatory Visit
Admission: RE | Admit: 2019-04-06 | Discharge: 2019-04-06 | Disposition: A | Discharge status: Home / Self Care | Payer: Medicare Other | Source: Ambulatory Visit | Attending: Infectious Diseases | Admitting: Infectious Diseases

## 2019-04-06 DIAGNOSIS — D509 Iron deficiency anemia, unspecified: Secondary | ICD-10-CM | POA: Diagnosis not present

## 2019-04-06 DIAGNOSIS — B182 Chronic viral hepatitis C: Secondary | ICD-10-CM | POA: Insufficient documentation

## 2019-04-06 DIAGNOSIS — N2581 Secondary hyperparathyroidism of renal origin: Secondary | ICD-10-CM | POA: Diagnosis not present

## 2019-04-06 DIAGNOSIS — D631 Anemia in chronic kidney disease: Secondary | ICD-10-CM | POA: Diagnosis not present

## 2019-04-06 DIAGNOSIS — E1129 Type 2 diabetes mellitus with other diabetic kidney complication: Secondary | ICD-10-CM | POA: Diagnosis not present

## 2019-04-06 DIAGNOSIS — Z992 Dependence on renal dialysis: Secondary | ICD-10-CM | POA: Diagnosis not present

## 2019-04-06 DIAGNOSIS — N186 End stage renal disease: Secondary | ICD-10-CM | POA: Diagnosis not present

## 2019-04-07 ENCOUNTER — Ambulatory Visit: Payer: Medicare Other

## 2019-04-07 DIAGNOSIS — E1129 Type 2 diabetes mellitus with other diabetic kidney complication: Secondary | ICD-10-CM | POA: Diagnosis not present

## 2019-04-07 DIAGNOSIS — N2581 Secondary hyperparathyroidism of renal origin: Secondary | ICD-10-CM | POA: Diagnosis not present

## 2019-04-07 DIAGNOSIS — D509 Iron deficiency anemia, unspecified: Secondary | ICD-10-CM | POA: Diagnosis not present

## 2019-04-07 DIAGNOSIS — Z992 Dependence on renal dialysis: Secondary | ICD-10-CM | POA: Diagnosis not present

## 2019-04-07 DIAGNOSIS — N186 End stage renal disease: Secondary | ICD-10-CM | POA: Diagnosis not present

## 2019-04-07 DIAGNOSIS — D631 Anemia in chronic kidney disease: Secondary | ICD-10-CM | POA: Diagnosis not present

## 2019-04-10 ENCOUNTER — Other Ambulatory Visit (INDEPENDENT_AMBULATORY_CARE_PROVIDER_SITE_OTHER): Payer: Self-pay | Admitting: Nurse Practitioner

## 2019-04-11 ENCOUNTER — Ambulatory Visit
Admission: RE | Admit: 2019-04-11 | Discharge: 2019-04-11 | Disposition: A | Discharge status: Home / Self Care | Payer: Medicare Other | Attending: Vascular Surgery | Admitting: Vascular Surgery

## 2019-04-11 ENCOUNTER — Encounter
Admission: RE | Disposition: A | Discharge status: Home / Self Care | Payer: Self-pay | Source: Home / Self Care | Attending: Vascular Surgery

## 2019-04-11 ENCOUNTER — Other Ambulatory Visit: Payer: Medicare Other

## 2019-04-11 ENCOUNTER — Other Ambulatory Visit
Admission: RE | Admit: 2019-04-11 | Discharge: 2019-04-11 | Disposition: A | Discharge status: Home / Self Care | Payer: Medicare Other | Source: Ambulatory Visit | Attending: Vascular Surgery | Admitting: Vascular Surgery

## 2019-04-11 ENCOUNTER — Other Ambulatory Visit: Payer: Self-pay

## 2019-04-11 DIAGNOSIS — I2510 Atherosclerotic heart disease of native coronary artery without angina pectoris: Secondary | ICD-10-CM | POA: Insufficient documentation

## 2019-04-11 DIAGNOSIS — I120 Hypertensive chronic kidney disease with stage 5 chronic kidney disease or end stage renal disease: Secondary | ICD-10-CM | POA: Diagnosis not present

## 2019-04-11 DIAGNOSIS — I712 Thoracic aortic aneurysm, without rupture: Secondary | ICD-10-CM | POA: Insufficient documentation

## 2019-04-11 DIAGNOSIS — N2581 Secondary hyperparathyroidism of renal origin: Secondary | ICD-10-CM | POA: Diagnosis not present

## 2019-04-11 DIAGNOSIS — N186 End stage renal disease: Secondary | ICD-10-CM | POA: Insufficient documentation

## 2019-04-11 DIAGNOSIS — I252 Old myocardial infarction: Secondary | ICD-10-CM | POA: Insufficient documentation

## 2019-04-11 DIAGNOSIS — Y841 Kidney dialysis as the cause of abnormal reaction of the patient, or of later complication, without mention of misadventure at the time of the procedure: Secondary | ICD-10-CM | POA: Insufficient documentation

## 2019-04-11 DIAGNOSIS — Z20828 Contact with and (suspected) exposure to other viral communicable diseases: Secondary | ICD-10-CM | POA: Insufficient documentation

## 2019-04-11 DIAGNOSIS — Z87891 Personal history of nicotine dependence: Secondary | ICD-10-CM | POA: Insufficient documentation

## 2019-04-11 DIAGNOSIS — E1122 Type 2 diabetes mellitus with diabetic chronic kidney disease: Secondary | ICD-10-CM | POA: Diagnosis not present

## 2019-04-11 DIAGNOSIS — Z992 Dependence on renal dialysis: Secondary | ICD-10-CM | POA: Diagnosis not present

## 2019-04-11 DIAGNOSIS — T82898A Other specified complication of vascular prosthetic devices, implants and grafts, initial encounter: Secondary | ICD-10-CM | POA: Diagnosis not present

## 2019-04-11 DIAGNOSIS — F329 Major depressive disorder, single episode, unspecified: Secondary | ICD-10-CM | POA: Insufficient documentation

## 2019-04-11 DIAGNOSIS — Z6841 Body Mass Index (BMI) 40.0 and over, adult: Secondary | ICD-10-CM | POA: Diagnosis not present

## 2019-04-11 DIAGNOSIS — D509 Iron deficiency anemia, unspecified: Secondary | ICD-10-CM | POA: Diagnosis not present

## 2019-04-11 DIAGNOSIS — M109 Gout, unspecified: Secondary | ICD-10-CM | POA: Diagnosis not present

## 2019-04-11 DIAGNOSIS — T82510D Breakdown (mechanical) of surgically created arteriovenous fistula, subsequent encounter: Secondary | ICD-10-CM | POA: Insufficient documentation

## 2019-04-11 DIAGNOSIS — E1129 Type 2 diabetes mellitus with other diabetic kidney complication: Secondary | ICD-10-CM | POA: Diagnosis not present

## 2019-04-11 DIAGNOSIS — D631 Anemia in chronic kidney disease: Secondary | ICD-10-CM | POA: Diagnosis not present

## 2019-04-11 DIAGNOSIS — K219 Gastro-esophageal reflux disease without esophagitis: Secondary | ICD-10-CM | POA: Insufficient documentation

## 2019-04-11 HISTORY — PX: A/V FISTULAGRAM: CATH118298

## 2019-04-11 LAB — SARS CORONAVIRUS 2 BY RT PCR (HOSPITAL ORDER, PERFORMED IN ~~LOC~~ HOSPITAL LAB): SARS Coronavirus 2: NEGATIVE

## 2019-04-11 LAB — POTASSIUM (ARMC VASCULAR LAB ONLY): Potassium (ARMC vascular lab): 3.5 (ref 3.5–5.1)

## 2019-04-11 LAB — GLUCOSE, CAPILLARY
Glucose-Capillary: 227 mg/dL — ABNORMAL HIGH (ref 70–99)
Glucose-Capillary: 222 mg/dL — ABNORMAL HIGH (ref 70–99)

## 2019-04-11 SURGERY — A/V FISTULAGRAM
Anesthesia: Moderate Sedation | Laterality: Left

## 2019-04-11 MED ORDER — FENTANYL CITRATE (PF) 100 MCG/2ML IJ SOLN
INTRAMUSCULAR | Status: DC | PRN
  Administered 2019-04-11 (×2): 50 ug via INTRAVENOUS

## 2019-04-11 MED ORDER — HEPARIN SODIUM (PORCINE) 1000 UNIT/ML IJ SOLN
INTRAMUSCULAR | Status: AC
  Filled 2019-04-11: qty 1

## 2019-04-11 MED ORDER — MIDAZOLAM HCL 2 MG/ML PO SYRP: 8.0000 mg | ORAL_SOLUTION | Freq: Once | ORAL | Status: DC | PRN

## 2019-04-11 MED ORDER — MIDAZOLAM HCL 2 MG/2ML IJ SOLN
INTRAMUSCULAR | Status: DC | PRN
  Administered 2019-04-11: 2 mg via INTRAVENOUS
  Administered 2019-04-11: 1 mg via INTRAVENOUS

## 2019-04-11 MED ORDER — FAMOTIDINE 20 MG PO TABS
ORAL_TABLET | ORAL | Status: AC
  Administered 2019-04-11: 40 mg via ORAL
  Filled 2019-04-11: qty 2

## 2019-04-11 MED ORDER — MIDAZOLAM HCL 2 MG/2ML IJ SOLN
INTRAMUSCULAR | Status: AC
  Filled 2019-04-11: qty 2

## 2019-04-11 MED ORDER — HYDROMORPHONE HCL 1 MG/ML IJ SOLN: 1.0000 mg | Freq: Once | INTRAMUSCULAR | Status: DC | PRN

## 2019-04-11 MED ORDER — METHYLPREDNISOLONE SODIUM SUCC 125 MG IJ SOLR
INTRAMUSCULAR | Status: AC
  Administered 2019-04-11: 125 mg via INTRAVENOUS
  Filled 2019-04-11: qty 2

## 2019-04-11 MED ORDER — CEFAZOLIN SODIUM-DEXTROSE 1-4 GM/50ML-% IV SOLN
1.0000 g | Freq: Once | INTRAVENOUS | Status: AC
  Administered 2019-04-11: 1 g via INTRAVENOUS

## 2019-04-11 MED ORDER — SODIUM CHLORIDE 0.9 % IV SOLN
INTRAVENOUS | Status: DC
  Administered 2019-04-11: 13:00:00 via INTRAVENOUS

## 2019-04-11 MED ORDER — FENTANYL CITRATE (PF) 100 MCG/2ML IJ SOLN
INTRAMUSCULAR | Status: AC
  Filled 2019-04-11: qty 2

## 2019-04-11 MED ORDER — ONDANSETRON HCL 4 MG/2ML IJ SOLN: 4.0000 mg | Freq: Four times a day (QID) | INTRAMUSCULAR | Status: DC | PRN

## 2019-04-11 MED ORDER — IODIXANOL 320 MG/ML IV SOLN
INTRAVENOUS | Status: DC | PRN
  Administered 2019-04-11: 35 mL via INTRAVENOUS

## 2019-04-11 MED ORDER — CEFAZOLIN SODIUM-DEXTROSE 2-4 GM/100ML-% IV SOLN: 2.0000 g | Freq: Once | INTRAVENOUS | Status: DC

## 2019-04-11 MED ORDER — BACITRACIN-NEOMYCIN-POLYMYXIN 400-5-5000 EX OINT
TOPICAL_OINTMENT | CUTANEOUS | Status: AC
  Filled 2019-04-11: qty 1

## 2019-04-11 MED ORDER — DIPHENHYDRAMINE HCL 50 MG/ML IJ SOLN
50.0000 mg | Freq: Once | INTRAMUSCULAR | Status: AC | PRN
  Administered 2019-04-11: 13:00:00 50 mg via INTRAVENOUS

## 2019-04-11 MED ORDER — HEPARIN SODIUM (PORCINE) 1000 UNIT/ML IJ SOLN
INTRAMUSCULAR | Status: DC | PRN
  Administered 2019-04-11: 3000 [IU] via INTRAVENOUS

## 2019-04-11 MED ORDER — FAMOTIDINE 20 MG PO TABS
40.0000 mg | ORAL_TABLET | Freq: Once | ORAL | Status: AC | PRN
  Administered 2019-04-11: 13:00:00 40 mg via ORAL

## 2019-04-11 MED ORDER — DIPHENHYDRAMINE HCL 50 MG/ML IJ SOLN
INTRAMUSCULAR | Status: AC
  Administered 2019-04-11: 50 mg via INTRAVENOUS
  Filled 2019-04-11: qty 1

## 2019-04-11 MED ORDER — MIDAZOLAM HCL 5 MG/5ML IJ SOLN
INTRAMUSCULAR | Status: AC
  Filled 2019-04-11: qty 5

## 2019-04-11 MED ORDER — METHYLPREDNISOLONE SODIUM SUCC 125 MG IJ SOLR
125.0000 mg | Freq: Once | INTRAMUSCULAR | Status: AC | PRN
  Administered 2019-04-11: 13:00:00 125 mg via INTRAVENOUS

## 2019-04-11 SURGICAL SUPPLY — 17 items
BALLN DORADO 5X40X80 (BALLOONS) ×2
BALLN DORADO 8X40X80 (BALLOONS) ×2
BALLOON DORADO 5X40X80 (BALLOONS) IMPLANT
BALLOON DORADO 8X40X80 (BALLOONS) IMPLANT
DEVICE PRESTO INFLATION (MISCELLANEOUS) ×1 IMPLANT
DRAPE BRACHIAL (DRAPES) ×2 IMPLANT
MARKER SKIN DUAL TIP RULER LAB (MISCELLANEOUS) ×1 IMPLANT
NDL ENTRY 21GA 7CM ECHOTIP (NEEDLE) IMPLANT
NEEDLE ENTRY 21GA 7CM ECHOTIP (NEEDLE) ×2 IMPLANT
PACK ANGIOGRAPHY (CUSTOM PROCEDURE TRAY) ×2 IMPLANT
SET INTRO CAPELLA COAXIAL (SET/KITS/TRAYS/PACK) ×2 IMPLANT
SHEATH BRITE TIP 6FRX5.5 (SHEATH) ×2 IMPLANT
SHEATH BRITE TIP 7FRX5.5 (SHEATH) ×1 IMPLANT
STENT COVERA FLARED 8X40X80 (Permanent Stent) ×1 IMPLANT
STENT COVERA FLARED 8X60X80 (Permanent Stent) ×2 IMPLANT
TOWEL OR 17X26 4PK STRL BLUE (TOWEL DISPOSABLE) ×1 IMPLANT
WIRE MAGIC TOR.035 180C (WIRE) ×2 IMPLANT

## 2019-04-11 NOTE — Op Note (Signed)
OPERATIVE NOTE   PROCEDURE: 1. Contrast injection left basilic AV access 2. Percutaneous transluminal angioplasty and stent placement venous outflow left basilic  PRE-OPERATIVE DIAGNOSIS: Complication of dialysis access                                                       End Stage Renal Disease  POST-OPERATIVE DIAGNOSIS: same as above   SURGEON: Katha Cabal, M.D.  ANESTHESIA: Conscious sedation was administered under my direct supervision by the interventional radiology RN. IV Versed plus fentanyl were utilized. Continuous ECG, pulse oximetry and blood pressure was monitored throughout the entire procedure.  Conscious sedation was for a total of 30 minutes.  ESTIMATED BLOOD LOSS: minimal  FINDING(S): 1. String sign at the venous outflow of the basilic vein   SPECIMEN(S):  None  CONTRAST: 40 cc  FLUOROSCOPY TIME: 4.5 minutes  INDICATIONS: [name] is a 63 y.o. female who  presents with malfunctioning left arm AV access.  The patient is scheduled for angiography with possible intervention of the AV access.  The patient is aware the risks include but are not limited to: bleeding, infection, thrombosis of the cannulated access, and possible anaphylactic reaction to the contrast.  The patient acknowledges if the access can not be salvaged a tunneled catheter will be needed and will be placed during this procedure.  The patient is aware of the risks of the procedure and elects to proceed with the angiogram and intervention.  DESCRIPTION: After full informed written consent was obtained, the patient was brought back to the Special Procedure suite and placed supine position.  Appropriate cardiopulmonary monitors were placed.  The left arm was prepped and draped in the standard fashion.  Appropriate timeout is called. The basilic transposition was cannulated with a micropuncture needle.  Cannulation was performed with ultrasound guidance. Ultrasound was placed in a sterile sleeve,  the AV access was interrogated and noted to be echolucent and compressible indicating patency. Image was recorded for the permanent record. The puncture is performed under continuous ultrasound visualization.   The microwire was advanced and the needle was exchanged for  a microsheath.  The J-wire was then advanced and a 6 Fr sheath inserted.  Hand injections were completed to image the access from the arterial anastomosis through the entire access.  The central venous structures were also imaged by hand injections.  Initial images demonstrate a string sign, greater than 90%, at the venous outflow.  The basilic vein otherwise appears to be widely patent and of good caliber.  Axillary subclavian innominate and superior vena cava are all widely patent.  Based on the images,  3000 units of heparin was given and a wire was negotiated through the stricture at the  venous venous outflow of the fistula.  The lesion was then predilated with a 5 mm Dorado balloon inflated to 24 atm for approximately 30 seconds.  The sheath was then upsized to a 7 [place] sheath.  An 8 mm x 40 mm Covera was deployed across the stenoses.  However the stent jumped forward and did not completely cover the stricture and therefore an 8 mm x 60 mm Covera stent was deployed and postdilated with an 8 mm [place] balloon inflated to 32 atm for 30 seconds.  Follow-up imaging demonstrates complete resolution of the stricture with rapid flow  of contrast through the graft, the central venous anatomy is preserved.  A 4-0 Monocryl purse-string suture was sewn around the sheath.  The sheath was removed and light pressure was applied.  A sterile bandage was applied to the puncture site.    COMPLICATIONS: None  CONDITION: [name], M.D [facility] Office: [phone]  04/11/2019 2:47 PM

## 2019-04-11 NOTE — Discharge Instructions (Signed)
Dialysis Fistulogram, Care After This sheet gives you information about how to care for yourself after your procedure. Your health care provider may also give you more specific instructions. If you have problems or questions, contact your health care provider. What can I expect after the procedure? After the procedure, it is common to have:  A small amount of discomfort in the area where the small, thin tube (catheter) was placed for the procedure.  A small amount of bruising around the fistula.  Sleepiness and tiredness (fatigue). Follow these instructions at home: Activity   Rest at home and do not lift anything that is heavier than 5 lb (2.3 kg) on the day after your procedure.  Return to your normal activities as told by your health care provider. Ask your health care provider what activities are safe for you.  Do not drive or use heavy machinery while taking prescription pain medicine.  Do not drive for 24 hours if you were given a medicine to help you relax (sedative) during your procedure. Medicines   Take over-the-counter and prescription medicines only as told by your health care provider. Puncture site care  Follow instructions from your health care provider about how to take care of the site where catheters were inserted. Make sure you: ? Wash your hands with soap and water before you change your bandage (dressing). If soap and water are not available, use hand sanitizer. ? Change your dressing as told by your health care provider. ? Leave stitches (sutures), skin glue, or adhesive strips in place. These skin closures may need to stay in place for 2 weeks or longer. If adhesive strip edges start to loosen and curl up, you may trim the loose edges. Do not remove adhesive strips completely unless your health care provider tells you to do that.  Check your puncture area every day for signs of infection. Check for: ? Redness, swelling, or pain. ? Fluid or  blood. ? Warmth. ? Pus or a bad smell. General instructions  Do not take baths, swim, or use a hot tub until your health care provider approves. Ask your health care provider if you may take showers. You may only be allowed to take sponge baths.  Monitor your dialysis fistula closely. Check to make sure that you can feel a vibration or buzz (a thrill) when you put your fingers over the fistula.  Prevent damage to your graft or fistula: ? Do not wear tight-fitting clothing or jewelry on the arm or leg that has your graft or fistula. ? Tell all your health care providers that you have a dialysis fistula or graft. ? Do not allow blood draws, IVs, or blood pressure readings to be done in the arm that has your fistula or graft. ? Do not allow flu shots or vaccinations in the arm with your fistula or graft.  Keep all follow-up visits as told by your health care provider. This is important. Contact a health care provider if:  You have redness, swelling, or pain at the site where the catheter was put in.  You have fluid or blood coming from the catheter site.  The catheter site feels warm to the touch.  You have pus or a bad smell coming from the catheter site.  You have a fever or chills. Get help right away if:  You feel weak.  You have trouble balancing.  You have trouble moving your arms or legs.  You have problems with your speech or vision.  You   can no longer feel a vibration or buzz when you put your fingers over your dialysis fistula.  The limb that was used for the procedure: ? Swells. ? Is painful. ? Is cold. ? Is discolored, such as blue or pale white.  You have chest pain or shortness of breath. Summary  After a dialysis fistulogram, it is common to have a small amount of discomfort or bruising in the area where the small, thin tube (catheter) was placed.  Rest at home on the day after your procedure. Return to your normal activities as told by your health care  provider.  Take over-the-counter and prescription medicines only as told by your health care provider.  Follow instructions from your health care provider about how to take care of the site where the catheter was inserted.  Keep all follow-up visits as told by your health care provider. This information is not intended to replace advice given to you by your health care provider. Make sure you discuss any questions you have with your health care provider. Document Released: 11/13/2013 Document Revised: 07/30/2017 Document Reviewed: 07/30/2017 Elsevier Patient Education  2020 [employer]    Moderate Conscious Sedation, Adult, Care After These instructions provide you with information about caring for yourself after your procedure. Your health care provider may also give you more specific instructions. Your treatment has been planned according to current medical practices, but problems sometimes occur. Call your health care provider if you have any problems or questions after your procedure. What can I expect after the procedure? After your procedure, it is common:  To feel sleepy for several hours.  To feel clumsy and have poor balance for several hours.  To have poor judgment for several hours.  To vomit if you eat too soon. Follow these instructions at home: For at least 24 hours after the procedure:   Do not: ? Participate in activities where you could fall or become injured. ? Drive. ? Use heavy machinery. ? Drink alcohol. ? Take sleeping pills or medicines that cause drowsiness. ? Make important decisions or sign legal documents. ? Take care of children on your own.  Rest. Eating and drinking  Follow the diet recommended by your health care provider.  If you vomit: ? Drink water, juice, or soup when you can drink without vomiting. ? Make sure you have little or no nausea before eating solid foods. General instructions  Have a responsible adult stay with you until  you are awake and alert.  Take over-the-counter and prescription medicines only as told by your health care provider.  If you smoke, do not smoke without supervision.  Keep all follow-up visits as told by your health care provider. This is important. Contact a health care provider if:  You keep feeling nauseous or you keep vomiting.  You feel light-headed.  You develop a rash.  You have a fever. Get help right away if:  You have trouble breathing. This information is not intended to replace advice given to you by your health care provider. Make sure you discuss any questions you have with your health care provider. Document Released: 04/19/2013 Document Revised: 06/11/2017 Document Reviewed: 10/19/2015 Elsevier Patient Education  2020 [employer].  

## 2019-04-11 NOTE — H&P (Signed)
[facility] History & Physical Update  The patient was interviewed and re-examined.  The patient's previous History and Physical has been reviewed and is unchanged.  There is no change in the plan of care. We plan to proceed with the scheduled procedure.  Hortencia Pilar, MD  04/11/2019, 2:46 PM

## 2019-04-12 ENCOUNTER — Other Ambulatory Visit: Payer: Self-pay

## 2019-04-12 ENCOUNTER — Other Ambulatory Visit: Payer: Self-pay | Admitting: Physician Assistant

## 2019-04-12 ENCOUNTER — Inpatient Hospital Stay: Admission: RE | Admit: 2019-04-12 | Payer: Medicare Other | Source: Ambulatory Visit

## 2019-04-12 ENCOUNTER — Ambulatory Visit (INDEPENDENT_AMBULATORY_CARE_PROVIDER_SITE_OTHER)
Admission: RE | Admit: 2019-04-12 | Discharge: 2019-04-12 | Disposition: A | Discharge status: Home / Self Care | Payer: Medicare Other | Source: Ambulatory Visit

## 2019-04-12 DIAGNOSIS — M62830 Muscle spasm of back: Secondary | ICD-10-CM

## 2019-04-12 DIAGNOSIS — M546 Pain in thoracic spine: Secondary | ICD-10-CM

## 2019-04-12 NOTE — Discharge Instructions (Addendum)
Come in for evaluation in person tomorrow at [facility].

## 2019-04-12 NOTE — Telephone Encounter (Signed)
error 

## 2019-04-12 NOTE — ED Provider Notes (Signed)
Virtual Visit via Video Note:  [name]  initiated request for Telemedicine visit with [facility] team. I connected with [name]  on 04/12/2019 at 3:42 PM  for a synchronized telemedicine visit using a video enabled HIPPA compliant telemedicine application. I verified that I am speaking with [name]  using two identifiers. Orvan July, NP  was physically located in a [facility]gent [facility] site and [name] was located at a different location.   The limitations of evaluation and management by telemedicine as well as the availability of in-person appointments were discussed. Patient was informed that she  may incur a bill ( including co-pay) for this virtual visit encounter. Heather Croak Lueck  expressed understanding and gave verbal consent to proceed with virtual visit.     History of Present Illness:[name]  is a 63 y.o. female presents with left upper back pain near her shoulder blade. This has been present and worsening x 1 month. Pain comes in waves and is sharp and stabbing when it comes. Last about 45 seconds. She take tramadol, lyrica daily for chronic pain. She has been taking oxycodone that she received from previous surgery which helps. Denies any numbness, tingling, weakness, SOB or chest pain. Last dialysis was Tuesday. Prior to all of this starting there was a fall down some steps. Reports at that time she landed on her side, not on her back. Sometimes her legs give out on her. No loss of bowel or bladder function.   Past Medical History:  Diagnosis Date  . Anemia   . Aortic stenosis    Echo 8/18: mean 13, peak 28, LVOT/AV mean velocity 0.51  . Arthritis   . Asthma    As a child   . Bronchitis   . CAD (coronary artery disease)    a. 09/2016: 50% Ost 1st Mrg stenosis, 50% 2nd Mrg stenosis, 20% Mid-Cx, 95% Prox LAD, 40% mid-LAD, and 10% dist-LAD stenosis. Staged PCI with DES to Prox-LAD.   [name] Chronic combined systolic and diastolic CHF (congestive  heart failure) ([place]) 2011   echo 2/18: EF 55-60, normal wall motion, grade 2 diastolic dysfunction, trivial AI // echo 3/18: Septal and apical HK, EF 45-50, normal wall motion, trivial AI, mild LAE, PASP 38 // echo 8/18: EF 60-65, normal wall motion, grade 1 diastolic dysfunction, calcified aortic valve leaflets, mild aortic stenosis (mean 13, peak 28, LVOT/AV mean velocity 0.51), mild AI, moderate MAC, mild LAE, trivial TR   . Chronic kidney disease    STAGE 4  . Chronic kidney disease on chronic dialysis (HCC)    t, th, sat  . Complication of anesthesia   . Depression   . Diabetes mellitus Dx 1989  . Elevated lipids   . GERD (gastroesophageal reflux disease)   . Gout   . Heart murmur    asymptomatic  . Hepatitis C Dx 2013  . Hypertension Dx 1989  . Infected surgical wound    Lt arm  . Myocardial infarction ([place]) 07/2015  . Obesity   . Pancreatitis 2013  . Pneumonia   . Refusal of blood transfusions as patient is Jehovah's Witness   . Tendinitis   . Tremors of nervous system    LEFT HAND  . Ulcer 2010    Allergies  Allergen Reactions  . Shellfish Allergy Anaphylaxis and Swelling  . Diazepam Other (See Comments)    "felt like out of body experience"  Observations/Objective:VITALS: Per patient if applicable, see vitals. GENERAL: Alert, appears well and in no acute distress. HEENT: Atraumatic, conjunctiva clear, no obvious abnormalities on inspection of external nose and ears. NECK: Normal movements of the head and neck. CARDIOPULMONARY: No increased WOB. Speaking in clear sentences. I:E ratio WNL.  MS: Moves all visible extremities without noticeable abnormality. PSYCH: Pleasant and cooperative, well-groomed. Speech normal rate and rhythm. Affect is appropriate. Insight and judgement are appropriate. Attention is focused, linear, and appropriate.  NEURO: CN grossly intact. Oriented as arrived to appointment on time with no prompting. Moves both UE equally.  SKIN: No  obvious lesions, wounds, erythema, or cyanosis noted on face or hands.     Assessment and Plan: Based on pt medical hx I recommended she come in for face to face evaluation. The pain is worsening. Sounds like nerve impingment but I cannot be sure. Pt agrees.    Follow Up Instructions: [facility] at [facility] tomorrow after dialysis treatment.     I discussed the assessment and treatment plan with the patient. The patient was provided an opportunity to ask questions and all were answered. The patient agreed with the plan and demonstrated an understanding of the instructions.   The patient was advised to call back or seek an in-person evaluation if the symptoms worsen or if the condition fails to improve as anticipated.      Orvan July, NP  04/12/2019 3:42 PM         Orvan July, NP 04/12/19 1617

## 2019-04-13 ENCOUNTER — Encounter: Payer: Self-pay | Admitting: Vascular Surgery

## 2019-04-13 ENCOUNTER — Other Ambulatory Visit: Payer: Self-pay | Admitting: Physician Assistant

## 2019-04-13 DIAGNOSIS — Z992 Dependence on renal dialysis: Secondary | ICD-10-CM | POA: Diagnosis not present

## 2019-04-13 DIAGNOSIS — N186 End stage renal disease: Secondary | ICD-10-CM | POA: Diagnosis not present

## 2019-04-13 DIAGNOSIS — M62830 Muscle spasm of back: Secondary | ICD-10-CM

## 2019-04-13 DIAGNOSIS — E1122 Type 2 diabetes mellitus with diabetic chronic kidney disease: Secondary | ICD-10-CM | POA: Diagnosis not present

## 2019-04-13 DIAGNOSIS — D631 Anemia in chronic kidney disease: Secondary | ICD-10-CM | POA: Diagnosis not present

## 2019-04-13 DIAGNOSIS — D509 Iron deficiency anemia, unspecified: Secondary | ICD-10-CM | POA: Diagnosis not present

## 2019-04-13 DIAGNOSIS — E1129 Type 2 diabetes mellitus with other diabetic kidney complication: Secondary | ICD-10-CM | POA: Diagnosis not present

## 2019-04-13 DIAGNOSIS — N2581 Secondary hyperparathyroidism of renal origin: Secondary | ICD-10-CM | POA: Diagnosis not present

## 2019-04-14 DIAGNOSIS — N2581 Secondary hyperparathyroidism of renal origin: Secondary | ICD-10-CM | POA: Diagnosis not present

## 2019-04-14 DIAGNOSIS — D631 Anemia in chronic kidney disease: Secondary | ICD-10-CM | POA: Diagnosis not present

## 2019-04-14 DIAGNOSIS — E1129 Type 2 diabetes mellitus with other diabetic kidney complication: Secondary | ICD-10-CM | POA: Diagnosis not present

## 2019-04-14 DIAGNOSIS — D509 Iron deficiency anemia, unspecified: Secondary | ICD-10-CM | POA: Diagnosis not present

## 2019-04-14 DIAGNOSIS — Z992 Dependence on renal dialysis: Secondary | ICD-10-CM | POA: Diagnosis not present

## 2019-04-14 DIAGNOSIS — N186 End stage renal disease: Secondary | ICD-10-CM | POA: Diagnosis not present

## 2019-04-18 DIAGNOSIS — Z992 Dependence on renal dialysis: Secondary | ICD-10-CM | POA: Diagnosis not present

## 2019-04-18 DIAGNOSIS — D631 Anemia in chronic kidney disease: Secondary | ICD-10-CM | POA: Diagnosis not present

## 2019-04-18 DIAGNOSIS — D509 Iron deficiency anemia, unspecified: Secondary | ICD-10-CM | POA: Diagnosis not present

## 2019-04-18 DIAGNOSIS — E1129 Type 2 diabetes mellitus with other diabetic kidney complication: Secondary | ICD-10-CM | POA: Diagnosis not present

## 2019-04-18 DIAGNOSIS — N186 End stage renal disease: Secondary | ICD-10-CM | POA: Diagnosis not present

## 2019-04-18 DIAGNOSIS — N2581 Secondary hyperparathyroidism of renal origin: Secondary | ICD-10-CM | POA: Diagnosis not present

## 2019-04-20 ENCOUNTER — Other Ambulatory Visit: Payer: Self-pay | Admitting: Physician Assistant

## 2019-04-20 DIAGNOSIS — D509 Iron deficiency anemia, unspecified: Secondary | ICD-10-CM | POA: Diagnosis not present

## 2019-04-20 DIAGNOSIS — Z992 Dependence on renal dialysis: Secondary | ICD-10-CM | POA: Diagnosis not present

## 2019-04-20 DIAGNOSIS — E1129 Type 2 diabetes mellitus with other diabetic kidney complication: Secondary | ICD-10-CM | POA: Diagnosis not present

## 2019-04-20 DIAGNOSIS — N2581 Secondary hyperparathyroidism of renal origin: Secondary | ICD-10-CM | POA: Diagnosis not present

## 2019-04-20 DIAGNOSIS — I5032 Chronic diastolic (congestive) heart failure: Secondary | ICD-10-CM

## 2019-04-20 DIAGNOSIS — N186 End stage renal disease: Secondary | ICD-10-CM | POA: Diagnosis not present

## 2019-04-20 DIAGNOSIS — D631 Anemia in chronic kidney disease: Secondary | ICD-10-CM | POA: Diagnosis not present

## 2019-04-22 DIAGNOSIS — N186 End stage renal disease: Secondary | ICD-10-CM | POA: Diagnosis not present

## 2019-04-22 DIAGNOSIS — D509 Iron deficiency anemia, unspecified: Secondary | ICD-10-CM | POA: Diagnosis not present

## 2019-04-22 DIAGNOSIS — N2581 Secondary hyperparathyroidism of renal origin: Secondary | ICD-10-CM | POA: Diagnosis not present

## 2019-04-22 DIAGNOSIS — Z992 Dependence on renal dialysis: Secondary | ICD-10-CM | POA: Diagnosis not present

## 2019-04-22 DIAGNOSIS — D631 Anemia in chronic kidney disease: Secondary | ICD-10-CM | POA: Diagnosis not present

## 2019-04-22 DIAGNOSIS — E1129 Type 2 diabetes mellitus with other diabetic kidney complication: Secondary | ICD-10-CM | POA: Diagnosis not present

## 2019-04-24 ENCOUNTER — Other Ambulatory Visit: Payer: Self-pay | Admitting: Physician Assistant

## 2019-04-24 DIAGNOSIS — J4530 Mild persistent asthma, uncomplicated: Secondary | ICD-10-CM

## 2019-04-27 DIAGNOSIS — D509 Iron deficiency anemia, unspecified: Secondary | ICD-10-CM | POA: Diagnosis not present

## 2019-04-27 DIAGNOSIS — E1129 Type 2 diabetes mellitus with other diabetic kidney complication: Secondary | ICD-10-CM | POA: Diagnosis not present

## 2019-04-27 DIAGNOSIS — D631 Anemia in chronic kidney disease: Secondary | ICD-10-CM | POA: Diagnosis not present

## 2019-04-27 DIAGNOSIS — N186 End stage renal disease: Secondary | ICD-10-CM | POA: Diagnosis not present

## 2019-04-27 DIAGNOSIS — N2581 Secondary hyperparathyroidism of renal origin: Secondary | ICD-10-CM | POA: Diagnosis not present

## 2019-04-27 DIAGNOSIS — Z992 Dependence on renal dialysis: Secondary | ICD-10-CM | POA: Diagnosis not present

## 2019-04-29 DIAGNOSIS — E1129 Type 2 diabetes mellitus with other diabetic kidney complication: Secondary | ICD-10-CM | POA: Diagnosis not present

## 2019-04-29 DIAGNOSIS — D631 Anemia in chronic kidney disease: Secondary | ICD-10-CM | POA: Diagnosis not present

## 2019-04-29 DIAGNOSIS — Z992 Dependence on renal dialysis: Secondary | ICD-10-CM | POA: Diagnosis not present

## 2019-04-29 DIAGNOSIS — N186 End stage renal disease: Secondary | ICD-10-CM | POA: Diagnosis not present

## 2019-04-29 DIAGNOSIS — D509 Iron deficiency anemia, unspecified: Secondary | ICD-10-CM | POA: Diagnosis not present

## 2019-04-29 DIAGNOSIS — N2581 Secondary hyperparathyroidism of renal origin: Secondary | ICD-10-CM | POA: Diagnosis not present

## 2019-05-01 ENCOUNTER — Other Ambulatory Visit (INDEPENDENT_AMBULATORY_CARE_PROVIDER_SITE_OTHER): Payer: Self-pay | Admitting: Vascular Surgery

## 2019-05-01 DIAGNOSIS — N186 End stage renal disease: Secondary | ICD-10-CM

## 2019-05-01 DIAGNOSIS — Z95820 Peripheral vascular angioplasty status with implants and grafts: Secondary | ICD-10-CM

## 2019-05-02 DIAGNOSIS — N186 End stage renal disease: Secondary | ICD-10-CM | POA: Diagnosis not present

## 2019-05-02 DIAGNOSIS — Z992 Dependence on renal dialysis: Secondary | ICD-10-CM | POA: Diagnosis not present

## 2019-05-02 DIAGNOSIS — D509 Iron deficiency anemia, unspecified: Secondary | ICD-10-CM | POA: Diagnosis not present

## 2019-05-02 DIAGNOSIS — N2581 Secondary hyperparathyroidism of renal origin: Secondary | ICD-10-CM | POA: Diagnosis not present

## 2019-05-02 DIAGNOSIS — D631 Anemia in chronic kidney disease: Secondary | ICD-10-CM | POA: Diagnosis not present

## 2019-05-02 DIAGNOSIS — E1129 Type 2 diabetes mellitus with other diabetic kidney complication: Secondary | ICD-10-CM | POA: Diagnosis not present

## 2019-05-03 ENCOUNTER — Encounter (INDEPENDENT_AMBULATORY_CARE_PROVIDER_SITE_OTHER): Payer: Medicare Other

## 2019-05-03 ENCOUNTER — Ambulatory Visit (INDEPENDENT_AMBULATORY_CARE_PROVIDER_SITE_OTHER): Payer: Medicare Other | Admitting: Nurse Practitioner

## 2019-05-04 ENCOUNTER — Telehealth (INDEPENDENT_AMBULATORY_CARE_PROVIDER_SITE_OTHER): Payer: Self-pay

## 2019-05-04 DIAGNOSIS — D509 Iron deficiency anemia, unspecified: Secondary | ICD-10-CM | POA: Diagnosis not present

## 2019-05-04 DIAGNOSIS — N186 End stage renal disease: Secondary | ICD-10-CM | POA: Diagnosis not present

## 2019-05-04 DIAGNOSIS — N2581 Secondary hyperparathyroidism of renal origin: Secondary | ICD-10-CM | POA: Diagnosis not present

## 2019-05-04 DIAGNOSIS — Z992 Dependence on renal dialysis: Secondary | ICD-10-CM | POA: Diagnosis not present

## 2019-05-04 DIAGNOSIS — E1129 Type 2 diabetes mellitus with other diabetic kidney complication: Secondary | ICD-10-CM | POA: Diagnosis not present

## 2019-05-04 DIAGNOSIS — D631 Anemia in chronic kidney disease: Secondary | ICD-10-CM | POA: Diagnosis not present

## 2019-05-04 NOTE — Telephone Encounter (Signed)
Sharyn Lull from dialysis called stating the patient is hard to stick and will run for 1 minute,then stop and her pressure will shoot up. I made her aware the patient miss her appointment on yesterday. I spoke with Dr Delana Meyer and he advise for the patient to reschedule appointment for next week. I will have someone to reach out to the patient to reschedule the appointment.

## 2019-05-06 DIAGNOSIS — D509 Iron deficiency anemia, unspecified: Secondary | ICD-10-CM | POA: Diagnosis not present

## 2019-05-06 DIAGNOSIS — D631 Anemia in chronic kidney disease: Secondary | ICD-10-CM | POA: Diagnosis not present

## 2019-05-06 DIAGNOSIS — Z992 Dependence on renal dialysis: Secondary | ICD-10-CM | POA: Diagnosis not present

## 2019-05-06 DIAGNOSIS — N186 End stage renal disease: Secondary | ICD-10-CM | POA: Diagnosis not present

## 2019-05-06 DIAGNOSIS — N2581 Secondary hyperparathyroidism of renal origin: Secondary | ICD-10-CM | POA: Diagnosis not present

## 2019-05-06 DIAGNOSIS — E1129 Type 2 diabetes mellitus with other diabetic kidney complication: Secondary | ICD-10-CM | POA: Diagnosis not present

## 2019-05-09 DIAGNOSIS — E1129 Type 2 diabetes mellitus with other diabetic kidney complication: Secondary | ICD-10-CM | POA: Diagnosis not present

## 2019-05-09 DIAGNOSIS — D631 Anemia in chronic kidney disease: Secondary | ICD-10-CM | POA: Diagnosis not present

## 2019-05-09 DIAGNOSIS — N186 End stage renal disease: Secondary | ICD-10-CM | POA: Diagnosis not present

## 2019-05-09 DIAGNOSIS — N2581 Secondary hyperparathyroidism of renal origin: Secondary | ICD-10-CM | POA: Diagnosis not present

## 2019-05-09 DIAGNOSIS — Z992 Dependence on renal dialysis: Secondary | ICD-10-CM | POA: Diagnosis not present

## 2019-05-09 DIAGNOSIS — D509 Iron deficiency anemia, unspecified: Secondary | ICD-10-CM | POA: Diagnosis not present

## 2019-05-11 DIAGNOSIS — E1129 Type 2 diabetes mellitus with other diabetic kidney complication: Secondary | ICD-10-CM | POA: Diagnosis not present

## 2019-05-11 DIAGNOSIS — D509 Iron deficiency anemia, unspecified: Secondary | ICD-10-CM | POA: Diagnosis not present

## 2019-05-11 DIAGNOSIS — D631 Anemia in chronic kidney disease: Secondary | ICD-10-CM | POA: Diagnosis not present

## 2019-05-11 DIAGNOSIS — N186 End stage renal disease: Secondary | ICD-10-CM | POA: Diagnosis not present

## 2019-05-11 DIAGNOSIS — N2581 Secondary hyperparathyroidism of renal origin: Secondary | ICD-10-CM | POA: Diagnosis not present

## 2019-05-11 DIAGNOSIS — Z992 Dependence on renal dialysis: Secondary | ICD-10-CM | POA: Diagnosis not present

## 2019-05-13 DIAGNOSIS — N2581 Secondary hyperparathyroidism of renal origin: Secondary | ICD-10-CM | POA: Diagnosis not present

## 2019-05-13 DIAGNOSIS — N186 End stage renal disease: Secondary | ICD-10-CM | POA: Diagnosis not present

## 2019-05-13 DIAGNOSIS — Z992 Dependence on renal dialysis: Secondary | ICD-10-CM | POA: Diagnosis not present

## 2019-05-13 DIAGNOSIS — E1129 Type 2 diabetes mellitus with other diabetic kidney complication: Secondary | ICD-10-CM | POA: Diagnosis not present

## 2019-05-13 DIAGNOSIS — D631 Anemia in chronic kidney disease: Secondary | ICD-10-CM | POA: Diagnosis not present

## 2019-05-13 DIAGNOSIS — D509 Iron deficiency anemia, unspecified: Secondary | ICD-10-CM | POA: Diagnosis not present

## 2019-05-14 ENCOUNTER — Encounter: Payer: Self-pay | Admitting: Physician Assistant

## 2019-05-14 DIAGNOSIS — E1122 Type 2 diabetes mellitus with diabetic chronic kidney disease: Secondary | ICD-10-CM | POA: Diagnosis not present

## 2019-05-14 DIAGNOSIS — N186 End stage renal disease: Secondary | ICD-10-CM | POA: Diagnosis not present

## 2019-05-14 DIAGNOSIS — Z992 Dependence on renal dialysis: Secondary | ICD-10-CM | POA: Diagnosis not present

## 2019-05-15 ENCOUNTER — Encounter (INDEPENDENT_AMBULATORY_CARE_PROVIDER_SITE_OTHER): Payer: Medicare Other

## 2019-05-15 ENCOUNTER — Ambulatory Visit (INDEPENDENT_AMBULATORY_CARE_PROVIDER_SITE_OTHER): Payer: Medicare Other

## 2019-05-15 ENCOUNTER — Encounter (INDEPENDENT_AMBULATORY_CARE_PROVIDER_SITE_OTHER): Payer: Self-pay | Admitting: Nurse Practitioner

## 2019-05-15 ENCOUNTER — Encounter: Payer: Self-pay | Admitting: Family Medicine

## 2019-05-15 ENCOUNTER — Ambulatory Visit: Payer: Self-pay | Admitting: Physician Assistant

## 2019-05-15 ENCOUNTER — Ambulatory Visit (INDEPENDENT_AMBULATORY_CARE_PROVIDER_SITE_OTHER): Payer: Medicare Other | Admitting: Family Medicine

## 2019-05-15 ENCOUNTER — Ambulatory Visit (INDEPENDENT_AMBULATORY_CARE_PROVIDER_SITE_OTHER): Payer: Medicare Other | Admitting: Nurse Practitioner

## 2019-05-15 ENCOUNTER — Other Ambulatory Visit: Payer: Self-pay

## 2019-05-15 VITALS — Temp 97.4°F

## 2019-05-15 VITALS — BP 149/73 | HR 96 | Resp 16 | Wt 288.0 lb

## 2019-05-15 DIAGNOSIS — M545 Low back pain, unspecified: Secondary | ICD-10-CM

## 2019-05-15 DIAGNOSIS — Z992 Dependence on renal dialysis: Secondary | ICD-10-CM

## 2019-05-15 DIAGNOSIS — Z95820 Peripheral vascular angioplasty status with implants and grafts: Secondary | ICD-10-CM

## 2019-05-15 DIAGNOSIS — N186 End stage renal disease: Secondary | ICD-10-CM

## 2019-05-15 DIAGNOSIS — I2510 Atherosclerotic heart disease of native coronary artery without angina pectoris: Secondary | ICD-10-CM

## 2019-05-15 DIAGNOSIS — E785 Hyperlipidemia, unspecified: Secondary | ICD-10-CM | POA: Diagnosis not present

## 2019-05-15 DIAGNOSIS — K219 Gastro-esophageal reflux disease without esophagitis: Secondary | ICD-10-CM | POA: Diagnosis not present

## 2019-05-15 DIAGNOSIS — R197 Diarrhea, unspecified: Secondary | ICD-10-CM

## 2019-05-15 NOTE — Progress Notes (Signed)
SUBJECTIVE:  Patient ID: [name], female    DOB: 1955/08/18, 63 y.o.   MRN: [id number] Chief Complaint  Patient presents with  . Follow-up    [facility] 3week HDA    HPI  [name] is a 63 y.o. female that presents today on referral from her dialysis center due to issues with her fistula access.  The patient recently had a long course of healing from her surgery.  The patient also recently had an angioplasty and stent placed on 04/11/2019.  Since this time the patient's fistula has only been able to be used twice.  The patient states that they will stick her but not be able to get good blood flow.  There is concerned that her current AV fistula is too deep for access.  At this time I can palpate her fistula and it is deep below the surface.  The patient denies any issues with her current PermCath access.  She denies any fever, chills, nausea, vomiting or diarrhea.  Today the patient has a patent left brachiobasilic AV fistula with a flow volume of 1524.  The fistula appears to be patent throughout.  Past Medical History:  Diagnosis Date  . Anemia   . Aortic stenosis    Echo 8/18: mean 13, peak 28, LVOT/AV mean velocity 0.51  . Arthritis   . Asthma    As a child   . Bronchitis   . CAD (coronary artery disease)    a. 09/2016: 50% Ost 1st Mrg stenosis, 50% 2nd Mrg stenosis, 20% Mid-Cx, 95% Prox LAD, 40% mid-LAD, and 10% dist-LAD stenosis. Staged PCI with DES to Prox-LAD.   [name] Chronic combined systolic and diastolic CHF (congestive heart failure) ([place]) 2011   echo 2/18: EF 55-60, normal wall motion, grade 2 diastolic dysfunction, trivial AI // echo 3/18: Septal and apical HK, EF 45-50, normal wall motion, trivial AI, mild LAE, PASP 38 // echo 8/18: EF 60-65, normal wall motion, grade 1 diastolic dysfunction, calcified aortic valve leaflets, mild aortic stenosis (mean 13, peak 28, LVOT/AV mean velocity 0.51), mild AI, moderate MAC, mild LAE, trivial TR   . Chronic kidney disease    STAGE  4  . Chronic kidney disease on chronic dialysis (HCC)    t, th, sat  . Complication of anesthesia   . Depression   . Diabetes mellitus Dx 1989  . Elevated lipids   . GERD (gastroesophageal reflux disease)   . Gout   . Heart murmur    asymptomatic  . Hepatitis C Dx 2013  . Hypertension Dx 1989  . Infected surgical wound    Lt arm  . Myocardial infarction ([place]) 07/2015  . Obesity   . Pancreatitis 2013  . Pneumonia   . Refusal of blood transfusions as patient is Jehovah's Witness   . Tendinitis   . Tremors of nervous system    LEFT HAND  . Ulcer 2010    Past Surgical History:  Procedure Laterality Date  . A/V FISTULAGRAM Left 04/11/2019   Procedure: A/V FISTULAGRAM;  Surgeon: Katha Cabal, MD;  Location: [place] [facility];  Service: Cardiovascular;  Laterality: Left;  . APPLICATION OF WOUND VAC Left 08/14/2017   Procedure: APPLICATION OF WOUND VAC Exchange;  Surgeon: Robert Bellow, MD;  Location: [facility];  Service: General;  Laterality: Left;  . APPLICATION OF WOUND VAC Left 12/21/2018   Procedure: APPLICATION OF WOUND VAC;  Surgeon: Katha Cabal, MD;  Location: [facility];  Service: Vascular;  Laterality: Left;  . AV FISTULA PLACEMENT Left 08/19/2018   Procedure: ARTERIOVENOUS (AV) FISTULA CREATION ( BRACHIOBASILIC );  Surgeon: Katha Cabal, MD;  Location: [facility];  Service: Vascular;  Laterality: Left;  . BASCILIC VEIN TRANSPOSITION Left 11/18/2018   Procedure: BASCILIC VEIN TRANSPOSITION;  Surgeon: Katha Cabal, MD;  Location: [facility];  Service: Vascular;  Laterality: Left;  . CHOLECYSTECTOMY    . COLONOSCOPY WITH PROPOFOL N/A 02/03/2018   Procedure: COLONOSCOPY WITH PROPOFOL;  Surgeon: Lin Landsman, MD;  Location: [facility];  Service: Gastroenterology;  Laterality: N/A;  . CORONARY ANGIOPLASTY  07/2015   STENT  . CORONARY STENT INTERVENTION N/A 09/18/2016   Procedure: Coronary Stent Intervention;  Surgeon: Troy Sine, MD;  Location:  [place] [facility];  Service: Cardiovascular;  Laterality: N/A;  . DIALYSIS/PERMA CATHETER INSERTION N/A 05/10/2018   Procedure: DIALYSIS/PERMA CATHETER INSERTION;  Surgeon: Katha Cabal, MD;  Location: [place] [facility];  Service: Cardiovascular;  Laterality: N/A;  . DRESSING CHANGE UNDER ANESTHESIA Left 08/15/2017   Procedure: exploration of wound for bleeding;  Surgeon: Robert Bellow, MD;  Location: [facility];  Service: General;  Laterality: Left;  . ESOPHAGOGASTRODUODENOSCOPY (EGD) WITH PROPOFOL N/A 02/03/2018   Procedure: ESOPHAGOGASTRODUODENOSCOPY (EGD) WITH PROPOFOL;  Surgeon: Lin Landsman, MD;  Location: [facility];  Service: Gastroenterology;  Laterality: N/A;  . EYE SURGERY  11/17/2018  . INCISION AND DRAINAGE ABSCESS Left 08/12/2017   Procedure: INCISION AND DRAINAGE ABSCESS;  Surgeon: Robert Bellow, MD;  Location: [facility];  Service: General;  Laterality: Left;  . KNEE ARTHROSCOPY    . LEFT HEART CATH N/A 09/18/2016   Procedure: Left Heart Cath;  Surgeon: Troy Sine, MD;  Location: [place] [facility];  Service: Cardiovascular;  Laterality: N/A;  . LEFT HEART CATH AND CORONARY ANGIOGRAPHY N/A 09/16/2016   Procedure: Left Heart Cath and Coronary Angiography;  Surgeon: Burnell Blanks, MD;  Location: [place] [facility];  Service: Cardiovascular;  Laterality: N/A;  . LEFT HEART CATH AND CORONARY ANGIOGRAPHY N/A 04/29/2017   Procedure: LEFT HEART CATH AND CORONARY ANGIOGRAPHY;  Surgeon: Nelva Bush, MD;  Location: [place] [facility];  Service: Cardiovascular;  Laterality: N/A;  . LOWER EXTREMITY ANGIOGRAPHY Right 03/08/2018   Procedure: LOWER EXTREMITY ANGIOGRAPHY;  Surgeon: Katha Cabal, MD;  Location: [place] [facility];  Service: Cardiovascular;  Laterality: Right;  . TUBAL LIGATION    . TUBAL LIGATION    . WOUND DEBRIDEMENT Left 12/21/2018   Procedure: DEBRIDEMENT WOUND;  Surgeon: Katha Cabal, MD;  Location: [facility];  Service:  Vascular;  Laterality: Left;  . WOUND DEBRIDEMENT Left 12/30/2018   Procedure: DEBRIDEMENT WOUND WITH VAC PLACEMENT (LEFT UPPER EXTREMITY);  Surgeon: Katha Cabal, MD;  Location: [facility];  Service: Vascular;  Laterality: Left;    Social History   Socioeconomic History  . Marital status: Divorced    Spouse name: Not on file  . Number of children: Not on file  . Years of education: Not on file  . Highest education level: Not on file  Occupational History  . Not on file  Social Needs  . Financial resource strain: Not on file  . Food insecurity    Worry: Not on file    Inability: Not on file  . Transportation needs    Medical: Not on file    Non-medical: Not on file  Tobacco Use  . Smoking status: Former Smoker    Packs/day: 0.25  Years: 6.00    Pack years: 1.50    Types: Cigarettes    Quit date: 10/25/1980    Years since quitting: 38.5  . Smokeless tobacco: Never Used  Substance and Sexual Activity  . Alcohol use: Yes    Comment: rarely  . Drug use: Yes    Types: Marijuana    Comment: Occasional marijuana use 08/21/2016 "no crack-clean since 05/1998"  . Sexual activity: Not on file    Comment: Not asked  Lifestyle  . Physical activity    Days per week: Not on file    Minutes per session: Not on file  . Stress: Not on file  Relationships  . Social Herbalist on phone: Not on file    Gets together: Not on file    Attends religious service: Not on file    Active member of club or organization: Not on file    Attends meetings of clubs or organizations: Not on file    Relationship status: Not on file  . Intimate partner violence    Fear of current or ex partner: Not on file    Emotionally abused: Not on file    Physically abused: Not on file    Forced sexual activity: Not on file  Other Topics Concern  . Not on file  Social History Narrative  . Not on file    Family History  Problem Relation Age of Onset  . Colon cancer Mother   . Heart attack  Other   . Heart attack Maternal Grandmother   . Hypertension Sister   . Hypertension Brother   . Diabetes Paternal Grandmother   . Breast cancer Neg Hx     Allergies  Allergen Reactions  . Shellfish Allergy Anaphylaxis and Swelling  . Diazepam Other (See Comments)    "felt like out of body experience"     Review of Systems   Review of Systems: Negative Unless Checked Constitutional: _0 Weight loss  _1 Fever  _2 Chills Cardiac: _3 Chest pain   _4  Atrial Fibrillation  _5 Palpitations   _6 Shortness of breath when laying flat   _7 Shortness of breath with exertion. _8 Shortness of breath at rest Vascular:  _9 Pain in legs with walking   _10 Pain in legs with standing _11 Pain in legs when laying flat   _12 Claudication    _13 Pain in feet when laying flat    _14 History of DVT   _15 Phlebitis   _16 Swelling in legs   _17 Varicose veins   _18 Non-healing ulcers Pulmonary:   _19 Uses home oxygen   _20 Productive cough   _21 Hemoptysis   _22 Wheeze  _23 COPD   _24 Asthma Neurologic:  _25 Dizziness   _26 Seizures  _27 Blackouts _28 History of stroke   _29 History of TIA  _30 Aphasia   _31 Temporary Blindness   _32 Weakness or numbness in arm   _33 Weakness or numbness in leg Musculoskeletal:   _34 Joint swelling   _35 Joint pain   _36 Low back pain  _37  History of Knee Replacement _38 Arthritis _39 back Surgeries  _40  Spinal Stenosis    Hematologic:  _41 Easy bruising  _42 Easy bleeding   _43 Hypercoagulable state   _44 Anemic Gastrointestinal:  _45 Diarrhea   _46 Vomiting  _47 Gastroesophageal reflux/heartburn   _48 Difficulty swallowing. _49 Abdominal pain Genitourinary:  _50 Chronic kidney disease   _51 Difficult urination  _52 Anuric   _53 Blood in urine _54 Frequent urination  _55 Burning with urination   _56 Hematuria Skin:  _57 Rashes   _58 Ulcers _59 Wounds Psychological:  _60 History of anxiety   _61  History of major depression  _62  Memory Difficulties      OBJECTIVE:   Physical Exam  BP (!) 149/73 (BP Location: Right Arm)   Pulse 96   Resp 16   Wt 288 lb (130.6 kg)   BMI 45.11  kg/m   Gen: WD/WN, NAD Head: [place]/AT, No temporalis wasting.  Ear/Nose/Throat: Hearing grossly intact, nares w/o erythema or drainage Eyes: PER, EOMI, sclera nonicteric.  Neck: Supple, no masses.  No JVD.  Pulmonary:  Good air movement, no use of accessory muscles.  Cardiac: RRR Vascular:  Palpable thrill however it is deep below surface, good bruit Vessel Right Left  Radial Palpable Palpable   Gastrointestinal: soft, non-distended. No guarding/no peritoneal signs.  Musculoskeletal: M/S 5/5 throughout.  No deformity or atrophy.  Neurologic: Pain and light touch intact in extremities.  Symmetrical.  Speech is fluent. Motor exam as listed above. Psychiatric: Judgment intact, Mood & affect appropriate for pt's clinical situation. Dermatologic: No Venous rashes. No Ulcers Noted.  No changes consistent with cellulitis. Lymph : No Cervical lymphadenopathy, no lichenification or skin changes of chronic lymphedema.       ASSESSMENT AND PLAN:  1. ESRD on dialysis [facility]) Today we will have the patient's fistula marked for access.  We will attempt to see if by identifying the landmarks of the fistula if this will help during hemodialysis access.  We will have the patient return in 1 week in order to see how cannulation has gone during dialysis after marking.  If she continues to have issues after this we will discuss superficialization of the fistula 2. Gastroesophageal reflux disease without esophagitis Continue PPI as already ordered, this medication has been reviewed and there are no changes at this time.  Avoidence of caffeine and alcohol  Moderate elevation of the head of the bed   3. Hyperlipidemia, unspecified hyperlipidemia type Continue statin as ordered and reviewed, no changes at this time    Current Outpatient Medications on File Prior to Visit  Medication Sig Dispense Refill  . Accu-Chek FastClix Lancets MISC USE AS DIRECTED TO CHECK BLOOD SUGAR THREE TIMES DAILY AND AT  BEDTIME 102 each 0  . albuterol (VENTOLIN HFA) 108 (90 Base) MCG/ACT inhaler INHALE 2 PUFFS BY MOUTH EVERY 4 HOURS AS NEEDED FOR SHORTNESS OF BREATH 18 g 5  . allopurinol (ZYLOPRIM) 100 MG tablet Take 1 tablet (100 mg total) by mouth every morning. 90 tablet 1  . aspirin EC 81 MG EC tablet Take 1 tablet (81 mg total) by mouth daily.    [name] atorvastatin (LIPITOR) 80 MG tablet TAKE 1 TABLET BY MOUTH ONCE DAILY AT 6PM 28 tablet 10  . AURYXIA 1 GM 210 MG(Fe) tablet Take 420 mg by mouth 2 (two) times daily with a meal.     . carvedilol (COREG) 25 MG tablet TAKE (1) TABLET BY MOUTH TWICE A DAY WITH MEALS (BREAKFAST AND SUPPER) 56 tablet 10  . cyclobenzaprine (FLEXERIL) 5 MG tablet TAKE 1 TABLET THREE TIMES DAILY AS NEEDED FOR MUSCLE SPASM 30 tablet 5  . docusate sodium (COLACE) 100 MG capsule Take 1 capsule (100 mg total) by mouth 2 (two) times daily as needed for mild constipation. 30 capsule 0  . fluticasone (FLONASE) 50 MCG/ACT nasal spray Place 2 sprays into both nostrils daily as needed for allergies or rhinitis. 48 g 1  . hydrALAZINE (APRESOLINE) 100 MG tablet TAKE (1) TABLET BY MOUTH THREE TIMES DAILY 84 tablet 5  . hydrOXYzine (ATARAX/VISTARIL) 25 MG tablet Take 1 tablet (25 mg total) by mouth every 6 (six) hours as needed for itching. 120 tablet 10  .  LEVEMIR FLEXTOUCH 100 UNIT/ML Pen Inject 60 Units into the skin 2 (two) times a day.    . lidocaine-prilocaine (EMLA) cream Apply 1 application topically as needed. 30 g 11  . liraglutide (VICTOZA) 18 MG/3ML SOPN Start with 0.40m daily and increase by 0.63mper week until max dose of 1.8 mg dose achieved. 9 mL 10  . loratadine (CLARITIN) 10 MG tablet TAKE 1 TABLET BY MOUTH ONCE DAILY. 28 tablet 10  . mometasone (ELOCON) 0.1 % cream APPLY AS DIRECTED TO AFFECTED AREA ONCE DAILY. 45 g 10  . mometasone-formoterol (DULERA) 200-5 MCG/ACT AERO Inhale 2 puffs into the lungs 2 (two) times daily. 13 g 5  . montelukast (SINGULAIR) 10 MG tablet TAKE ONE TABLET  BY MOUTH AT BEDTIME. 28 tablet 5  . nitroGLYCERIN (NITROSTAT) 0.4 MG SL tablet Place 1 tablet (0.4 mg total) under the tongue every 5 (five) minutes x 3 doses as needed for chest pain. 25 tablet 12  . NOVOLOG FLEXPEN 100 UNIT/ML FlexPen INJECT 25 UNITS S.Q. THREE TIMES A DAY WITH MEALS. 15 mL 10  . omeprazole (PRILOSEC) 20 MG capsule TAKE (1) CAPSULE BY MOUTH ONCE DAILY. 28 capsule 10  . pregabalin (LYRICA) 75 MG capsule Take 1 capsule (75 mg total) by mouth 2 (two) times daily. 180 capsule 1  . SENNA PLUS 8.6-50 MG tablet TAKE ONE TABLET BY MOUTH AT BEDTIME. 28 tablet 10  . silver sulfADIAZINE (SILVADENE) 1 % cream Apply 1 application topically daily. Apply to right first toe 50 g 0  . torsemide (DEMADEX) 20 MG tablet TAKE (2) TABLETS BY MOUTH TWICE DAILY. 112 tablet 11  . traMADol (ULTRAM) 50 MG tablet TAKE (1) OR (2) TABLETS EVERY SIX HOURS AS NEEDED FOR PAIN. -MAY MAKEDROWSY- 40 tablet 0  . triamcinolone cream (KENALOG) 0.1 % APPLY TO AFFECTED AREA TWICE DAILY 80 g 0  . ULTICARE SHORT PEN NEEDLES 31G X 8 MM MISC USE THREE TIMES DAILY WITH INSULIN 100 each 5  . valACYclovir (VALTREX) 500 MG tablet TAKE (1) TABLET BY MOUTH EVERY OTHER DAY. 14 tablet 10  . Vitamin D, Ergocalciferol, (DRISDOL) 1.25 MG (50000 UT) CAPS capsule TAKE 1 CAPSULE BY MOUTH ONCE A MONTH 3 capsule 3  . [DISCONTINUED] gabapentin (NEURONTIN) 100 MG capsule Take 1 capsule (100 mg total) by mouth 3 (three) times daily. 90 capsule 1   No current facility-administered medications on file prior to visit.     There are no Patient Instructions on file for this visit. No follow-ups on file.   FaKris HartmannNP  This note was completed with DrSales executive Any errors are purely unintentional.

## 2019-05-15 NOTE — Progress Notes (Signed)
[name]  MRN: [id number] DOB: 1955/07/21 Virtual Visit via Telephone Note  I connected with [name] on 05/15/19 at  2:40 PM EST by telephone and verified that I am speaking with the correct person using two identifiers.  Location: Patient: Home Provider: Office   I discussed the limitations, risks, security and privacy concerns of performing an evaluation and management service by telephone and the availability of in person appointments. I also discussed with the patient that there may be a patient responsible charge related to this service. The patient expressed understanding and agreed to proceed.  Subjective:  HPI   The patient is a 63 year old female who is being seen via phone visit.  She typically sees [place], PA-C.  She complains of abdominal pain that has been coming and going for a while.  However on Friday, 3 days ago she developed back pain and has been experiencing it since that time.   She states she has diarrhea and no constipation with the pain, currently having diarrhea.  No blood in the stool and unsure of fever.  Although she reports having "fever in her stomach".  She also reports having indigestion at this time.   She is seen by GI but has not discussed this with them.  Patient Active Problem List   Diagnosis Date Noted  . Ascending aortic aneurysm ([place]) 03/27/2019  . Non-healing wound of upper extremity 12/28/2018  . Complication from renal dialysis device 10/20/2018  . ESRD on dialysis ([place]) 06/01/2018  . ARF (acute renal failure) ([place]) 05/09/2018  . Colon cancer screening   . LGSIL on Pap smear of cervix 01/27/2018  . Gout 12/25/2017  . AKI (acute kidney injury) ([place]) 12/24/2017  . Hyperglycemia 12/24/2017  . Chronic diastolic heart failure ([place]) 09/30/2017  . Lymphedema 09/30/2017  . Aortic stenosis   . Chronic pain of right knee 02/12/2017  . Chronic gout due to renal impairment of multiple sites without tophus 02/12/2017  .  Esophageal dysphagia 02/12/2017  . Osteoarthritis 01/22/2017  . Diabetic hyperosmolar non-ketotic state ([place]) 12/13/2016  . CAD (coronary artery disease) 12/13/2016  . Hyperlipidemia 12/13/2016  . Hyperphosphatemia 12/13/2016  . Asthma 11/29/2015  . Depression 09/05/2015  . GERD (gastroesophageal reflux disease) 03/25/2015  . Environmental allergies 03/14/2015  . Chronic hepatitis C without hepatic coma ([place]) 01/31/2015  . Diabetic neuropathy ([place]) 01/31/2015  . OSA (obstructive sleep apnea) 01/01/2015  . Poor dentition 11/13/2014  . Essential hypertension 10/08/2014  . Morbid (severe) obesity due to excess calories ([place]) 10/08/2014  . Diabetes mellitus type 2, uncontrolled, with complications ([place]) 13/24/4010    Class: Chronic    Past Medical History:  Diagnosis Date  . Anemia   . Aortic stenosis    Echo 8/18: mean 13, peak 28, LVOT/AV mean velocity 0.51  . Arthritis   . Asthma    As a child   . Bronchitis   . CAD (coronary artery disease)    a. 09/2016: 50% Ost 1st Mrg stenosis, 50% 2nd Mrg stenosis, 20% Mid-Cx, 95% Prox LAD, 40% mid-LAD, and 10% dist-LAD stenosis. Staged PCI with DES to Prox-LAD.   [name] Chronic combined systolic and diastolic CHF (congestive heart failure) ([place]) 2011   echo 2/18: EF 55-60, normal wall motion, grade 2 diastolic dysfunction, trivial AI // echo 3/18: Septal and apical HK, EF 45-50, normal wall motion, trivial AI, mild LAE, PASP 38 // echo 8/18: EF 60-65, normal wall motion, grade 1 diastolic dysfunction, calcified aortic valve  leaflets, mild aortic stenosis (mean 13, peak 28, LVOT/AV mean velocity 0.51), mild AI, moderate MAC, mild LAE, trivial TR   . Chronic kidney disease    STAGE 4  . Chronic kidney disease on chronic dialysis ([facility])    t, th, sat  . Complication of anesthesia   . Depression   . Diabetes mellitus Dx 1989  . Elevated lipids   . GERD (gastroesophageal reflux disease)   . Gout   . Heart murmur    asymptomatic  . Hepatitis C  Dx 2013  . Hypertension Dx 1989  . Infected surgical wound    Lt arm  . Myocardial infarction ([place]) 07/2015  . Obesity   . Pancreatitis 2013  . Pneumonia   . Refusal of blood transfusions as patient is Jehovah's Witness   . Tendinitis   . Tremors of nervous system    LEFT HAND  . Ulcer 2010    Social History   Socioeconomic History  . Marital status: Divorced    Spouse name: Not on file  . Number of children: Not on file  . Years of education: Not on file  . Highest education level: Not on file  Occupational History  . Not on file  Social Needs  . Financial resource strain: Not on file  . Food insecurity    Worry: Not on file    Inability: Not on file  . Transportation needs    Medical: Not on file    Non-medical: Not on file  Tobacco Use  . Smoking status: Former Smoker    Packs/day: 0.25    Years: 6.00    Pack years: 1.50    Types: Cigarettes    Quit date: 10/25/1980    Years since quitting: 38.5  . Smokeless tobacco: Never Used  Substance and Sexual Activity  . Alcohol use: Yes    Comment: rarely  . Drug use: Yes    Types: Marijuana    Comment: Occasional marijuana use 08/21/2016 "no crack-clean since 05/1998"  . Sexual activity: Not on file    Comment: Not asked  Lifestyle  . Physical activity    Days per week: Not on file    Minutes per session: Not on file  . Stress: Not on file  Relationships  . Social Herbalist on phone: Not on file    Gets together: Not on file    Attends religious service: Not on file    Active member of club or organization: Not on file    Attends meetings of clubs or organizations: Not on file    Relationship status: Not on file  . Intimate partner violence    Fear of current or ex partner: Not on file    Emotionally abused: Not on file    Physically abused: Not on file    Forced sexual activity: Not on file  Other Topics Concern  . Not on file  Social History Narrative  . Not on file    Outpatient  Encounter Medications as of 05/15/2019  Medication Sig  . Accu-Chek FastClix Lancets MISC USE AS DIRECTED TO CHECK BLOOD SUGAR THREE TIMES DAILY AND AT BEDTIME  . albuterol (VENTOLIN HFA) 108 (90 Base) MCG/ACT inhaler INHALE 2 PUFFS BY MOUTH EVERY 4 HOURS AS NEEDED FOR SHORTNESS OF BREATH  . allopurinol (ZYLOPRIM) 100 MG tablet Take 1 tablet (100 mg total) by mouth every morning.  [name] aspirin EC 81 MG EC tablet Take 1 tablet (81 mg total) by mouth  daily.  . atorvastatin (LIPITOR) 80 MG tablet TAKE 1 TABLET BY MOUTH ONCE DAILY AT 6PM  . AURYXIA 1 GM 210 MG(Fe) tablet Take 420 mg by mouth 2 (two) times daily with a meal.   . carvedilol (COREG) 25 MG tablet TAKE (1) TABLET BY MOUTH TWICE A DAY WITH MEALS (BREAKFAST AND SUPPER)  . cyclobenzaprine (FLEXERIL) 5 MG tablet TAKE 1 TABLET THREE TIMES DAILY AS NEEDED FOR MUSCLE SPASM  . docusate sodium (COLACE) 100 MG capsule Take 1 capsule (100 mg total) by mouth 2 (two) times daily as needed for mild constipation.  . fluticasone (FLONASE) 50 MCG/ACT nasal spray Place 2 sprays into both nostrils daily as needed for allergies or rhinitis.  . hydrALAZINE (APRESOLINE) 100 MG tablet TAKE (1) TABLET BY MOUTH THREE TIMES DAILY  . hydrOXYzine (ATARAX/VISTARIL) 25 MG tablet Take 1 tablet (25 mg total) by mouth every 6 (six) hours as needed for itching.  [name] LEVEMIR FLEXTOUCH 100 UNIT/ML Pen Inject 60 Units into the skin 2 (two) times a day.  . lidocaine-prilocaine (EMLA) cream Apply 1 application topically as needed.  . liraglutide (VICTOZA) 18 MG/3ML SOPN Start with 0.61m daily and increase by 0.627mper week until max dose of 1.8 mg dose achieved.  . loratadine (CLARITIN) 10 MG tablet TAKE 1 TABLET BY MOUTH ONCE DAILY.  . mometasone (ELOCON) 0.1 % cream APPLY AS DIRECTED TO AFFECTED AREA ONCE DAILY.  . mometasone-formoterol (DULERA) 200-5 MCG/ACT AERO Inhale 2 puffs into the lungs 2 (two) times daily.  . montelukast (SINGULAIR) 10 MG tablet TAKE ONE TABLET BY MOUTH AT  BEDTIME.  . nitroGLYCERIN (NITROSTAT) 0.4 MG SL tablet Place 1 tablet (0.4 mg total) under the tongue every 5 (five) minutes x 3 doses as needed for chest pain.  . Marland KitchenOVOLOG FLEXPEN 100 UNIT/ML FlexPen INJECT 25 UNITS S.Q. THREE TIMES A DAY WITH MEALS.  . Marland Kitchenmeprazole (PRILOSEC) 20 MG capsule TAKE (1) CAPSULE BY MOUTH ONCE DAILY.  . Marland Kitchenregabalin (LYRICA) 75 MG capsule Take 1 capsule (75 mg total) by mouth 2 (two) times daily.  . SENNA PLUS 8.6-50 MG tablet TAKE ONE TABLET BY MOUTH AT BEDTIME.  . silver sulfADIAZINE (SILVADENE) 1 % cream Apply 1 application topically daily. Apply to right first toe  . torsemide (DEMADEX) 20 MG tablet TAKE (2) TABLETS BY MOUTH TWICE DAILY.  . traMADol (ULTRAM) 50 MG tablet TAKE (1) OR (2) TABLETS EVERY SIX HOURS AS NEEDED FOR PAIN. -MAY MAKEDROWSY-  . triamcinolone cream (KENALOG) 0.1 % APPLY TO AFFECTED AREA TWICE DAILY  . ULTICARE SHORT PEN NEEDLES 31G X 8 MM MISC USE THREE TIMES DAILY WITH INSULIN  . valACYclovir (VALTREX) 500 MG tablet TAKE (1) TABLET BY MOUTH EVERY OTHER DAY.  . Marland Kitchenitamin D, Ergocalciferol, (DRISDOL) 1.25 MG (50000 UT) CAPS capsule TAKE 1 CAPSULE BY MOUTH ONCE A MONTH  . [DISCONTINUED] gabapentin (NEURONTIN) 100 MG capsule Take 1 capsule (100 mg total) by mouth 3 (three) times daily.   No facility-administered encounter medications on file as of 05/15/2019.    Allergies  Allergen Reactions  . Shellfish Allergy Anaphylaxis and Swelling  . Diazepam Other (See Comments)    "felt like out of body experience"   Review of Systems  Constitutional: Positive for fever and malaise/fatigue. Negative for chills and diaphoresis.  HENT: Negative for congestion, ear pain and sore throat.   Respiratory: Negative for cough and shortness of breath.   Gastrointestinal: Positive for abdominal pain, constipation, diarrhea and heartburn. Negative for blood in stool, nausea  and vomiting.    Objective:   Today's Vitals   05/15/19 1543  Temp: (!) 97.4 F (36.3 C)    Physical Exam: No apparent respiratory distress during telephonic interview.  Assessment and Plan :   1. Acute right-sided low back pain without sciatica Having right low back pain described as an aching that radiates around to abdomen when she twists her torso to the left. Suspect muscle spasm from strain. May use Salonpas Lidocaine patch prn pain with moist heat applications when not using the patch. Try not to use extra oral medications that would be a problem with dialysis and renal failure.  2. ESRD on dialysis [facility]) Presently on dialysis 3 days a week. Renal failure suspected secondary to diabetes nephropathy.  3. Diarrhea, unspecified type Having some diarrhea the past 3 days. No dizziness or fainting. Denies abdominal pains. Some aching in the right low back that radiates to the right abdomen when she twists her torso to the left. States she had a similar bout of diarrhea a year ago without specific diagnosis she can remember. May try Kaopectate 1 tablespoon qd prn. Follow up with nephrologist or go to ER if worsening. May need rehydration.  Follow Up Instructions:  I discussed the assessment and treatment plan with the patient. The patient was provided an opportunity to ask questions and all were answered. The patient agreed with the plan and demonstrated an understanding of the instructions.   The patient was advised to call back or seek an in-person evaluation if the symptoms worsen or if the condition fails to improve as anticipated.  I provided 15 minutes of non-face-to-face time during this encounter.   Vernie Murders, PA

## 2019-05-16 DIAGNOSIS — N2581 Secondary hyperparathyroidism of renal origin: Secondary | ICD-10-CM | POA: Diagnosis not present

## 2019-05-16 DIAGNOSIS — D631 Anemia in chronic kidney disease: Secondary | ICD-10-CM | POA: Diagnosis not present

## 2019-05-16 DIAGNOSIS — Z992 Dependence on renal dialysis: Secondary | ICD-10-CM | POA: Diagnosis not present

## 2019-05-16 DIAGNOSIS — E1129 Type 2 diabetes mellitus with other diabetic kidney complication: Secondary | ICD-10-CM | POA: Diagnosis not present

## 2019-05-16 DIAGNOSIS — N186 End stage renal disease: Secondary | ICD-10-CM | POA: Diagnosis not present

## 2019-05-16 DIAGNOSIS — D509 Iron deficiency anemia, unspecified: Secondary | ICD-10-CM | POA: Diagnosis not present

## 2019-05-18 DIAGNOSIS — D631 Anemia in chronic kidney disease: Secondary | ICD-10-CM | POA: Diagnosis not present

## 2019-05-18 DIAGNOSIS — N186 End stage renal disease: Secondary | ICD-10-CM | POA: Diagnosis not present

## 2019-05-18 DIAGNOSIS — D509 Iron deficiency anemia, unspecified: Secondary | ICD-10-CM | POA: Diagnosis not present

## 2019-05-18 DIAGNOSIS — E1129 Type 2 diabetes mellitus with other diabetic kidney complication: Secondary | ICD-10-CM | POA: Diagnosis not present

## 2019-05-18 DIAGNOSIS — Z992 Dependence on renal dialysis: Secondary | ICD-10-CM | POA: Diagnosis not present

## 2019-05-18 DIAGNOSIS — N2581 Secondary hyperparathyroidism of renal origin: Secondary | ICD-10-CM | POA: Diagnosis not present

## 2019-05-19 ENCOUNTER — Telehealth (INDEPENDENT_AMBULATORY_CARE_PROVIDER_SITE_OTHER): Payer: Self-pay | Admitting: Vascular Surgery

## 2019-05-19 NOTE — Telephone Encounter (Signed)
She can see me that's fine

## 2019-05-19 NOTE — Telephone Encounter (Signed)
Ask Arna Medici please.

## 2019-05-20 DIAGNOSIS — D631 Anemia in chronic kidney disease: Secondary | ICD-10-CM | POA: Diagnosis not present

## 2019-05-20 DIAGNOSIS — D509 Iron deficiency anemia, unspecified: Secondary | ICD-10-CM | POA: Diagnosis not present

## 2019-05-20 DIAGNOSIS — E1129 Type 2 diabetes mellitus with other diabetic kidney complication: Secondary | ICD-10-CM | POA: Diagnosis not present

## 2019-05-20 DIAGNOSIS — Z992 Dependence on renal dialysis: Secondary | ICD-10-CM | POA: Diagnosis not present

## 2019-05-20 DIAGNOSIS — N2581 Secondary hyperparathyroidism of renal origin: Secondary | ICD-10-CM | POA: Diagnosis not present

## 2019-05-20 DIAGNOSIS — N186 End stage renal disease: Secondary | ICD-10-CM | POA: Diagnosis not present

## 2019-05-22 ENCOUNTER — Ambulatory Visit (INDEPENDENT_AMBULATORY_CARE_PROVIDER_SITE_OTHER): Payer: Medicare Other | Admitting: Vascular Surgery

## 2019-05-22 ENCOUNTER — Other Ambulatory Visit: Payer: Self-pay

## 2019-05-22 ENCOUNTER — Encounter (INDEPENDENT_AMBULATORY_CARE_PROVIDER_SITE_OTHER): Payer: Self-pay | Admitting: Vascular Surgery

## 2019-05-22 VITALS — BP 141/79 | HR 86 | Resp 20 | Ht 67.5 in | Wt 290.0 lb

## 2019-05-22 DIAGNOSIS — T829XXS Unspecified complication of cardiac and vascular prosthetic device, implant and graft, sequela: Secondary | ICD-10-CM

## 2019-05-22 DIAGNOSIS — I10 Essential (primary) hypertension: Secondary | ICD-10-CM

## 2019-05-22 DIAGNOSIS — I2510 Atherosclerotic heart disease of native coronary artery without angina pectoris: Secondary | ICD-10-CM | POA: Diagnosis not present

## 2019-05-22 DIAGNOSIS — N186 End stage renal disease: Secondary | ICD-10-CM

## 2019-05-22 DIAGNOSIS — Z992 Dependence on renal dialysis: Secondary | ICD-10-CM | POA: Diagnosis not present

## 2019-05-22 DIAGNOSIS — J45909 Unspecified asthma, uncomplicated: Secondary | ICD-10-CM | POA: Diagnosis not present

## 2019-05-22 NOTE — Progress Notes (Signed)
MRN : [id number]  [name] is a 63 y.o. ([birth year]) female who presents with chief complaint of  Chief Complaint  Patient presents with  . Follow-up  .  History of Present Illness:  The patient returns to the office for follow up regarding problem with the dialysis access. Currently the patient is maintained via a catheter and has a patent basilic transposition.  The patient has had multiple failed upper extremity accesses.  The patient notes a significant increase in bleeding time after decannulation.  The patient has also been informed that there is increased recirculation.    The patient denies hand pain or other symptoms consistent with steal phenomena.  No significant arm swelling.  The patient denies redness or swelling at the access site. The patient denies fever or chills at home or while on dialysis.  The patient denies amaurosis fugax or recent TIA symptoms. There are no recent neurological changes noted. The patient denies claudication symptoms or rest pain symptoms. The patient denies history of DVT, PE or superficial thrombophlebitis. The patient denies recent episodes of angina or shortness of breath.      Current Meds  Medication Sig  . Accu-Chek FastClix Lancets MISC USE AS DIRECTED TO CHECK BLOOD SUGAR THREE TIMES DAILY AND AT BEDTIME  . albuterol (VENTOLIN HFA) 108 (90 Base) MCG/ACT inhaler INHALE 2 PUFFS BY MOUTH EVERY 4 HOURS AS NEEDED FOR SHORTNESS OF BREATH  . allopurinol (ZYLOPRIM) 100 MG tablet Take 1 tablet (100 mg total) by mouth every morning.  [name] aspirin EC 81 MG EC tablet Take 1 tablet (81 mg total) by mouth daily.  [name] atorvastatin (LIPITOR) 80 MG tablet TAKE 1 TABLET BY MOUTH ONCE DAILY AT 6PM  . AURYXIA 1 GM 210 MG(Fe) tablet Take 420 mg by mouth 2 (two) times daily with a meal.   . carvedilol (COREG) 25 MG tablet TAKE (1) TABLET BY MOUTH TWICE A DAY WITH MEALS (BREAKFAST AND SUPPER)  . cyclobenzaprine (FLEXERIL) 5 MG tablet TAKE 1 TABLET THREE  TIMES DAILY AS NEEDED FOR MUSCLE SPASM  . docusate sodium (COLACE) 100 MG capsule Take 1 capsule (100 mg total) by mouth 2 (two) times daily as needed for mild constipation.  . fluticasone (FLONASE) 50 MCG/ACT nasal spray Place 2 sprays into both nostrils daily as needed for allergies or rhinitis.  . hydrALAZINE (APRESOLINE) 100 MG tablet TAKE (1) TABLET BY MOUTH THREE TIMES DAILY  . hydrOXYzine (ATARAX/VISTARIL) 25 MG tablet Take 1 tablet (25 mg total) by mouth every 6 (six) hours as needed for itching.  [name] LEVEMIR FLEXTOUCH 100 UNIT/ML Pen Inject 60 Units into the skin 2 (two) times a day.  . lidocaine-prilocaine (EMLA) cream Apply 1 application topically as needed.  . liraglutide (VICTOZA) 18 MG/3ML SOPN Start with 0.63m daily and increase by 0.670mper week until max dose of 1.8 mg dose achieved.  . loratadine (CLARITIN) 10 MG tablet TAKE 1 TABLET BY MOUTH ONCE DAILY.  . mometasone (ELOCON) 0.1 % cream APPLY AS DIRECTED TO AFFECTED AREA ONCE DAILY.  . mometasone-formoterol (DULERA) 200-5 MCG/ACT AERO Inhale 2 puffs into the lungs 2 (two) times daily.  . montelukast (SINGULAIR) 10 MG tablet TAKE ONE TABLET BY MOUTH AT BEDTIME.  . nitroGLYCERIN (NITROSTAT) 0.4 MG SL tablet Place 1 tablet (0.4 mg total) under the tongue every 5 (five) minutes x 3 doses as needed for chest pain.  . Marland KitchenOVOLOG FLEXPEN 100 UNIT/ML FlexPen INJECT 25 UNITS S.Q. THREE TIMES A DAY WITH MEALS.  .[name]  omeprazole (PRILOSEC) 20 MG capsule TAKE (1) CAPSULE BY MOUTH ONCE DAILY.  [name] pregabalin (LYRICA) 75 MG capsule Take 1 capsule (75 mg total) by mouth 2 (two) times daily.  . SENNA PLUS 8.6-50 MG tablet TAKE ONE TABLET BY MOUTH AT BEDTIME.  . silver sulfADIAZINE (SILVADENE) 1 % cream Apply 1 application topically daily. Apply to right first toe  . torsemide (DEMADEX) 20 MG tablet TAKE (2) TABLETS BY MOUTH TWICE DAILY.  . traMADol (ULTRAM) 50 MG tablet TAKE (1) OR (2) TABLETS EVERY SIX HOURS AS NEEDED FOR PAIN. -MAY MAKEDROWSY-  .  triamcinolone cream (KENALOG) 0.1 % APPLY TO AFFECTED AREA TWICE DAILY  . ULTICARE SHORT PEN NEEDLES 31G X 8 MM MISC USE THREE TIMES DAILY WITH INSULIN  . valACYclovir (VALTREX) 500 MG tablet TAKE (1) TABLET BY MOUTH EVERY OTHER DAY.  [name] Vitamin D, Ergocalciferol, (DRISDOL) 1.25 MG (50000 UT) CAPS capsule TAKE 1 CAPSULE BY MOUTH ONCE A MONTH    Past Medical History:  Diagnosis Date  . Anemia   . Aortic stenosis    Echo 8/18: mean 13, peak 28, LVOT/AV mean velocity 0.51  . Arthritis   . Asthma    As a child   . Bronchitis   . CAD (coronary artery disease)    a. 09/2016: 50% Ost 1st Mrg stenosis, 50% 2nd Mrg stenosis, 20% Mid-Cx, 95% Prox LAD, 40% mid-LAD, and 10% dist-LAD stenosis. Staged PCI with DES to Prox-LAD.   [name] Chronic combined systolic and diastolic CHF (congestive heart failure) ([place]) 2011   echo 2/18: EF 55-60, normal wall motion, grade 2 diastolic dysfunction, trivial AI // echo 3/18: Septal and apical HK, EF 45-50, normal wall motion, trivial AI, mild LAE, PASP 38 // echo 8/18: EF 60-65, normal wall motion, grade 1 diastolic dysfunction, calcified aortic valve leaflets, mild aortic stenosis (mean 13, peak 28, LVOT/AV mean velocity 0.51), mild AI, moderate MAC, mild LAE, trivial TR   . Chronic kidney disease    STAGE 4  . Chronic kidney disease on chronic dialysis (HCC)    t, th, sat  . Complication of anesthesia   . Depression   . Diabetes mellitus Dx 1989  . Elevated lipids   . GERD (gastroesophageal reflux disease)   . Gout   . Heart murmur    asymptomatic  . Hepatitis C Dx 2013  . Hypertension Dx 1989  . Infected surgical wound    Lt arm  . Myocardial infarction ([place]) 07/2015  . Obesity   . Pancreatitis 2013  . Pneumonia   . Refusal of blood transfusions as patient is Jehovah's Witness   . Tendinitis   . Tremors of nervous system    LEFT HAND  . Ulcer 2010    Past Surgical History:  Procedure Laterality Date  . A/V FISTULAGRAM Left 04/11/2019   Procedure:  A/V FISTULAGRAM;  Surgeon: Katha Cabal, MD;  Location: [place] [facility];  Service: Cardiovascular;  Laterality: Left;  . APPLICATION OF WOUND VAC Left 08/14/2017   Procedure: APPLICATION OF WOUND VAC Exchange;  Surgeon: Robert Bellow, MD;  Location: [facility];  Service: General;  Laterality: Left;  . APPLICATION OF WOUND VAC Left 12/21/2018   Procedure: APPLICATION OF WOUND VAC;  Surgeon: Katha Cabal, MD;  Location: [facility];  Service: Vascular;  Laterality: Left;  . AV FISTULA PLACEMENT Left 08/19/2018   Procedure: ARTERIOVENOUS (AV) FISTULA CREATION ( BRACHIOBASILIC );  Surgeon: Katha Cabal, MD;  Location: [facility];  Service: Vascular;  Laterality: Left;  . BASCILIC VEIN TRANSPOSITION Left 11/18/2018   Procedure: BASCILIC VEIN TRANSPOSITION;  Surgeon: Katha Cabal, MD;  Location: [facility];  Service: Vascular;  Laterality: Left;  . CHOLECYSTECTOMY    . COLONOSCOPY WITH PROPOFOL N/A 02/03/2018   Procedure: COLONOSCOPY WITH PROPOFOL;  Surgeon: Lin Landsman, MD;  Location: [facility];  Service: Gastroenterology;  Laterality: N/A;  . CORONARY ANGIOPLASTY  07/2015   STENT  . CORONARY STENT INTERVENTION N/A 09/18/2016   Procedure: Coronary Stent Intervention;  Surgeon: Troy Sine, MD;  Location: [place] [facility];  Service: Cardiovascular;  Laterality: N/A;  . DIALYSIS/PERMA CATHETER INSERTION N/A 05/10/2018   Procedure: DIALYSIS/PERMA CATHETER INSERTION;  Surgeon: Katha Cabal, MD;  Location: [place] [facility];  Service: Cardiovascular;  Laterality: N/A;  . DRESSING CHANGE UNDER ANESTHESIA Left 08/15/2017   Procedure: exploration of wound for bleeding;  Surgeon: Robert Bellow, MD;  Location: [facility];  Service: General;  Laterality: Left;  . ESOPHAGOGASTRODUODENOSCOPY (EGD) WITH PROPOFOL N/A 02/03/2018   Procedure: ESOPHAGOGASTRODUODENOSCOPY (EGD) WITH PROPOFOL;  Surgeon: Lin Landsman, MD;  Location: [facility];  Service:  Gastroenterology;  Laterality: N/A;  . EYE SURGERY  11/17/2018  . INCISION AND DRAINAGE ABSCESS Left 08/12/2017   Procedure: INCISION AND DRAINAGE ABSCESS;  Surgeon: Robert Bellow, MD;  Location: [facility];  Service: General;  Laterality: Left;  . KNEE ARTHROSCOPY    . LEFT HEART CATH N/A 09/18/2016   Procedure: Left Heart Cath;  Surgeon: Troy Sine, MD;  Location: [place] [facility];  Service: Cardiovascular;  Laterality: N/A;  . LEFT HEART CATH AND CORONARY ANGIOGRAPHY N/A 09/16/2016   Procedure: Left Heart Cath and Coronary Angiography;  Surgeon: Burnell Blanks, MD;  Location: [place] [facility];  Service: Cardiovascular;  Laterality: N/A;  . LEFT HEART CATH AND CORONARY ANGIOGRAPHY N/A 04/29/2017   Procedure: LEFT HEART CATH AND CORONARY ANGIOGRAPHY;  Surgeon: Nelva Bush, MD;  Location: [place] [facility];  Service: Cardiovascular;  Laterality: N/A;  . LOWER EXTREMITY ANGIOGRAPHY Right 03/08/2018   Procedure: LOWER EXTREMITY ANGIOGRAPHY;  Surgeon: Katha Cabal, MD;  Location: [place] [facility];  Service: Cardiovascular;  Laterality: Right;  . TUBAL LIGATION    . TUBAL LIGATION    . WOUND DEBRIDEMENT Left 12/21/2018   Procedure: DEBRIDEMENT WOUND;  Surgeon: Katha Cabal, MD;  Location: [facility];  Service: Vascular;  Laterality: Left;  . WOUND DEBRIDEMENT Left 12/30/2018   Procedure: DEBRIDEMENT WOUND WITH VAC PLACEMENT (LEFT UPPER EXTREMITY);  Surgeon: Katha Cabal, MD;  Location: [facility];  Service: Vascular;  Laterality: Left;    Social History Social History   Tobacco Use  . Smoking status: Former Smoker    Packs/day: 0.25    Years: 6.00    Pack years: 1.50    Types: Cigarettes    Quit date: 10/25/1980    Years since quitting: 38.5  . Smokeless tobacco: Never Used  Substance Use Topics  . Alcohol use: Yes    Comment: rarely  . Drug use: Yes    Types: Marijuana    Comment: Occasional marijuana use 08/21/2016 "no crack-clean since 05/1998"     Family History Family History  Problem Relation Age of Onset  . Colon cancer Mother   . Heart attack Other   . Heart attack Maternal Grandmother   . Hypertension Sister   . Hypertension Brother   . Diabetes Paternal Grandmother   . Breast cancer Neg Hx  Allergies  Allergen Reactions  . Shellfish Allergy Anaphylaxis and Swelling  . Diazepam Other (See Comments)    "felt like out of body experience"     REVIEW OF SYSTEMS (Negative unless checked)  Constitutional: _0 Weight loss  _1 Fever  _2 Chills Cardiac: _3 Chest pain   _4 Chest pressure   _5 Palpitations   _6 Shortness of breath when laying flat   _7 Shortness of breath with exertion. Vascular:  _8 Pain in legs with walking   _9 Pain in legs at rest  _10 History of DVT   _11 Phlebitis   _12 Swelling in legs   _13 Varicose veins   _14 Non-healing ulcers Pulmonary:   _15 Uses home oxygen   _16 Productive cough   _17 Hemoptysis   _18 Wheeze  _19 COPD   _20 Asthma Neurologic:  _21 Dizziness   _22 Seizures   _23 History of stroke   _24 History of TIA  _25 Aphasia   _26 Vissual changes   _27 Weakness or numbness in arm   _28 Weakness or numbness in leg Musculoskeletal:   _29 Joint swelling   _30 Joint pain   _31 Low back pain Hematologic:  _32 Easy bruising  _33 Easy bleeding   _34 Hypercoagulable state   _35 Anemic Gastrointestinal:  _36 Diarrhea   _37 Vomiting  _38 Gastroesophageal reflux/heartburn   _39 Difficulty swallowing. Genitourinary:  _40 Chronic kidney disease   _41 Difficult urination  _42 Frequent urination   _43 Blood in urine Skin:  _44 Rashes   _45 Ulcers  Psychological:  _46 History of anxiety   _47  History of major depression.  Physical Examination  Vitals:   05/22/19 1039  BP: (!) 141/79  Pulse: 86  Resp: 20  Weight: 290 lb (131.5 kg)  Height: 5' 7.5" (1.715 m)   Body mass index is 44.75 kg/m. Gen: WD/WN, NAD Head: [place]/AT, No temporalis wasting.  Ear/Nose/Throat: Hearing grossly intact, nares w/o erythema or drainage Eyes: PER, EOMI, sclera nonicteric.  Neck: Supple, no large  masses.   Pulmonary:  Good air movement, no audible wheezing bilaterally, no use of accessory muscles.  Cardiac: RRR, no JVD Vascular:  Left arm fistula good thrill good bruit arm is quite large and complicates cannulation  Vessel Right Left  Radial Palpable Palpable  Brachial Palpable Palpable  Gastrointestinal: Non-distended. No guarding/no peritoneal signs.  Musculoskeletal: M/S 5/5 throughout.  No deformity or atrophy.  Neurologic: CN 2-12 intact. Symmetrical.  Speech is fluent. Motor exam as listed above. Psychiatric: Judgment intact, Mood & affect appropriate for pt's clinical situation. Dermatologic: No rashes or ulcers noted.  No changes consistent with cellulitis. Lymph : No lichenification or skin changes of chronic lymphedema.  CBC Lab Results  Component Value Date   WBC 6.7 04/04/2019   HGB 10.9 (L) 04/04/2019   HCT 33.4 (L) 04/04/2019   MCV 92.3 04/04/2019   PLT 171 04/04/2019    BMET    Component Value Date/Time   NA 133 (L) 04/04/2019 1237   NA 142 01/27/2018 1353   K 3.2 (L) 04/04/2019 1237   CL 92 (L) 04/04/2019 1237   CO2 27 04/04/2019 1237   GLUCOSE 420 (H) 04/04/2019 1237   BUN 34 (H) 04/04/2019 1237   BUN 82 (HH) 01/27/2018 1353   CREATININE 3.59 (H) 04/04/2019 1237   CREATININE 1.36 (H) 09/23/2016 1451   CALCIUM 8.2 (L) 04/04/2019 1237   GFRNONAA 13 (L) 04/04/2019 1237   GFRNONAA 43 (L) 09/23/2016 1451   GFRAA 15 (L) 04/04/2019 1237   GFRAA 49 (L) 09/23/2016 1451   CrCl cannot be calculated (Patient's most recent lab result is older than the maximum 21 days allowed.).  COAG Lab Results  Component Value Date   INR 0.9 04/04/2019  INR 1.0 12/30/2018   INR 1.0 12/20/2018    Radiology Vas US Duplex Dialysis Access (avf, Avg)  Result Date: 05/15/2019 DIALYSIS ACCESS Access Site: Left Upper Extremity. Access Type: Brachial Basilic AVF. History: ESRD          11/15/9739 left brachibasilic created.           04/11/2019: PTA and Stent placement in  the Venous Outflow Left Basilic          Vein. Comparison Study: 03/27/2019 Performing Technologist: Almira Coaster RVS  Examination Guidelines: A complete evaluation includes B-mode imaging, spectral Doppler, color Doppler, and power Doppler as needed of all accessible portions of each vessel. Unilateral testing is considered an integral part of a complete examination. Limited examinations for reoccurring indications may be performed as noted.  Findings: +--------------------+----------+-----------------+--------+ AVF                 PSV (cm/s)Flow Vol (mL/min)Comments +--------------------+----------+-----------------+--------+ Native artery inflow   169          1524                +--------------------+----------+-----------------+--------+ AVF Anastomosis        310                              +--------------------+----------+-----------------+--------+  +-------------+----------+-------------+----------+--------+ OUTFLOW VEIN PSV (cm/s)Diameter (cm)Depth (cm)Describe +-------------+----------+-------------+----------+--------+ Axillary vein   246                                    +-------------+----------+-------------+----------+--------+ [place]      222                                    +-------------+----------+-------------+----------+--------+ Prox UA         470                                    +-------------+----------+-------------+----------+--------+ Mid UA          103                                    +-------------+----------+-------------+----------+--------+ Dist UA         158                                    +-------------+----------+-------------+----------+--------+  +---------------+-------------+---------+---------+----------+-----------------+                Diameter (cm)  Depth  BranchingPSV (cm/s)   Flow Volume                                  (cm)                          (ml/min)       +---------------+-------------+---------+---------+----------+-----------------+ Lt Radial Art  12                      Dist                                                                      +---------------+-------------+---------+---------+----------+-----------------+  Summary: The Left Brachial-Basilic V AVF appears to be patent throughout; Normal Flow Volume seen.  *See table(s) above for measurements and observations.  Diagnosing physician: Hortencia Pilar MD Electronically signed by Hortencia Pilar MD on 05/15/2019 at 5:29:10 PM.    --------------------------------------------------------------------------------   Final      Assessment/Plan 1. Complication from renal dialysis device, sequela Recommend:  The patient is experiencing increasing problems with their dialysis access.  Patient should have a fistulagram with the intention for intervention.  The intention for intervention is to restore appropriate flow and prevent thrombosis and possible loss of the access.  As well as improve the quality of dialysis therapy.  The risks, benefits and alternative therapies were reviewed in detail with the patient.  All questions were answered.  The patient agrees to proceed with angio/intervention.    Patient will be marked and also images printed and given to her to take to her center.  Ultimately she may need a superficialization.     2. ESRD on dialysis ([place]) At the present time the patient does not have adequate dialysis access. Arrangements will be made to correct this as noted above.  Continue hemodialysis as ordered without interruption.  Avoid nephrotoxic medications and dehydration.  Further plans per nephrology  3. Coronary artery disease involving native coronary artery of native heart without angina pectoris Continue cardiac and antihypertensive medications as already ordered and reviewed, no changes at this time.  Continue  statin as ordered and reviewed, no changes at this time  Nitrates PRN for chest pain   4. Essential hypertension Continue antihypertensive medications as already ordered, these medications have been reviewed and there are no changes at this time.   5. Uncomplicated asthma, unspecified asthma severity, unspecified whether persistent Continue pulmonary medications and aerosols as already ordered, these medications have been reviewed and there are no changes at this time.      Hortencia Pilar, MD  05/22/2019 10:50 AM

## 2019-05-23 ENCOUNTER — Telehealth (INDEPENDENT_AMBULATORY_CARE_PROVIDER_SITE_OTHER): Payer: Self-pay

## 2019-05-23 ENCOUNTER — Ambulatory Visit (INDEPENDENT_AMBULATORY_CARE_PROVIDER_SITE_OTHER): Payer: Medicare Other | Admitting: Nurse Practitioner

## 2019-05-23 DIAGNOSIS — E1129 Type 2 diabetes mellitus with other diabetic kidney complication: Secondary | ICD-10-CM | POA: Diagnosis not present

## 2019-05-23 DIAGNOSIS — Z992 Dependence on renal dialysis: Secondary | ICD-10-CM | POA: Diagnosis not present

## 2019-05-23 DIAGNOSIS — D631 Anemia in chronic kidney disease: Secondary | ICD-10-CM | POA: Diagnosis not present

## 2019-05-23 DIAGNOSIS — N2581 Secondary hyperparathyroidism of renal origin: Secondary | ICD-10-CM | POA: Diagnosis not present

## 2019-05-23 DIAGNOSIS — N186 End stage renal disease: Secondary | ICD-10-CM | POA: Diagnosis not present

## 2019-05-23 DIAGNOSIS — D509 Iron deficiency anemia, unspecified: Secondary | ICD-10-CM | POA: Diagnosis not present

## 2019-05-23 NOTE — Telephone Encounter (Signed)
Spoke with the patient and she is now scheduled with Dr. Delana Meyer for a fistulagram on 06/02/2019 with a 11:00 am arrival time to the [facility]. Patient will do her Covid testing on 05/30/2019 between 12:30-2:30 pm at the [facility]. Pre-procedure instructions were discussed and will be mailed to the patient and faxed to the dialysis center.

## 2019-05-25 DIAGNOSIS — D631 Anemia in chronic kidney disease: Secondary | ICD-10-CM | POA: Diagnosis not present

## 2019-05-25 DIAGNOSIS — D509 Iron deficiency anemia, unspecified: Secondary | ICD-10-CM | POA: Diagnosis not present

## 2019-05-25 DIAGNOSIS — N2581 Secondary hyperparathyroidism of renal origin: Secondary | ICD-10-CM | POA: Diagnosis not present

## 2019-05-25 DIAGNOSIS — Z992 Dependence on renal dialysis: Secondary | ICD-10-CM | POA: Diagnosis not present

## 2019-05-25 DIAGNOSIS — N186 End stage renal disease: Secondary | ICD-10-CM | POA: Diagnosis not present

## 2019-05-25 DIAGNOSIS — E1129 Type 2 diabetes mellitus with other diabetic kidney complication: Secondary | ICD-10-CM | POA: Diagnosis not present

## 2019-05-26 ENCOUNTER — Encounter (INDEPENDENT_AMBULATORY_CARE_PROVIDER_SITE_OTHER): Payer: Self-pay | Admitting: Vascular Surgery

## 2019-05-27 DIAGNOSIS — D631 Anemia in chronic kidney disease: Secondary | ICD-10-CM | POA: Diagnosis not present

## 2019-05-27 DIAGNOSIS — Z992 Dependence on renal dialysis: Secondary | ICD-10-CM | POA: Diagnosis not present

## 2019-05-27 DIAGNOSIS — N186 End stage renal disease: Secondary | ICD-10-CM | POA: Diagnosis not present

## 2019-05-27 DIAGNOSIS — E1129 Type 2 diabetes mellitus with other diabetic kidney complication: Secondary | ICD-10-CM | POA: Diagnosis not present

## 2019-05-27 DIAGNOSIS — D509 Iron deficiency anemia, unspecified: Secondary | ICD-10-CM | POA: Diagnosis not present

## 2019-05-27 DIAGNOSIS — N2581 Secondary hyperparathyroidism of renal origin: Secondary | ICD-10-CM | POA: Diagnosis not present

## 2019-05-30 ENCOUNTER — Other Ambulatory Visit: Admission: RE | Admit: 2019-05-30 | Payer: Medicare Other | Source: Ambulatory Visit

## 2019-05-30 ENCOUNTER — Encounter: Payer: Self-pay | Admitting: Infectious Diseases

## 2019-05-30 ENCOUNTER — Other Ambulatory Visit: Payer: Medicare Other

## 2019-05-30 DIAGNOSIS — E1129 Type 2 diabetes mellitus with other diabetic kidney complication: Secondary | ICD-10-CM | POA: Diagnosis not present

## 2019-05-30 DIAGNOSIS — D509 Iron deficiency anemia, unspecified: Secondary | ICD-10-CM | POA: Diagnosis not present

## 2019-05-30 DIAGNOSIS — N186 End stage renal disease: Secondary | ICD-10-CM | POA: Diagnosis not present

## 2019-05-30 DIAGNOSIS — N2581 Secondary hyperparathyroidism of renal origin: Secondary | ICD-10-CM | POA: Diagnosis not present

## 2019-05-30 DIAGNOSIS — D631 Anemia in chronic kidney disease: Secondary | ICD-10-CM | POA: Diagnosis not present

## 2019-05-30 DIAGNOSIS — Z992 Dependence on renal dialysis: Secondary | ICD-10-CM | POA: Diagnosis not present

## 2019-05-30 NOTE — Progress Notes (Signed)
Tests result DATE comment  HEPC RNA 1,165,790 01/31/15   HEPC  Genotype 1b 12/14/17   Hepatitis B profile Surface ab 61.9 04/04/19 Sag neg  Hepatitis A status positive 12/14/17   PT/PTT 12.3/0.9 04/04/19   AST, ALT, Bilirubin 67/56/0.8    Albumin 3.5    creatinine 3.59    HB/Platelet 10.9/171    HIV NR 12/28/18   AFP 6.9 04/04/19   TSH 1.560 12/28/18   comorbidities  ESRD    tobacco     alcohol     drug     APRI Score 0.956  In a meta-analysis of 40 studies, investigators concluded that an APRI score greater than 1.0 had a sensitivity of 76% and specificity of 72% for predicting cirrhosis. In addition, they concluded that an APRI score greater than 0.7 had a sensitivity of 77% and specificity of 72% for predicting significant hepatic fibrosis.1 For detection of cirrhosis, using an APRI cutoff score of 2.0 was more specific (91%) but less sensitive (46%). The lower the APRI score (less than 0.5), the greater the negative predictive value (and ability to rule out cirrhosis) and the higher the value (greater than 1.5) the greater the positive predictive value (and ability to rule in cirrhosis); midrange values are less helpful. The APRI alone is likely not sufficiently sensitive to rule out significant disease. Some evidence suggests that the use of multiple indices in combination (such as APRI plus FibroTest) or an algorithmic approach may result in higher diagnostic accuracy than using APRI alone.2  FIB 4 score 3.19  Using a lower cutoff value of 1.45, a FIB-4 score <1.45 had a negative predictive value of 90% for advanced fibrosis ([place] fibrosis score 4-6 which includes early bridging fibrosis to cirrhosis). In contrast, a FIB-4 >3.25 would have a 97% specificity and a positive predictive value of 65% for advanced fibrosis. In the patient cohort in which this formula was first validated, at least 70% patients had values <1.45 or >3.25. Authors argued that these individuals could potentially have avoided  liver biopsy with an overall accuracy of 86%.  Current meds     Ultrasound Elastography Median velocity:   1.09 m/sec  Corresponding Metavir fibrosis score:  F0/F1

## 2019-05-31 ENCOUNTER — Other Ambulatory Visit
Admission: RE | Admit: 2019-05-31 | Discharge: 2019-05-31 | Disposition: A | Discharge status: Home / Self Care | Payer: Medicare Other | Source: Ambulatory Visit | Attending: Vascular Surgery | Admitting: Vascular Surgery

## 2019-05-31 ENCOUNTER — Other Ambulatory Visit: Payer: Self-pay

## 2019-05-31 DIAGNOSIS — Z01812 Encounter for preprocedural laboratory examination: Secondary | ICD-10-CM | POA: Diagnosis not present

## 2019-05-31 DIAGNOSIS — Z20828 Contact with and (suspected) exposure to other viral communicable diseases: Secondary | ICD-10-CM | POA: Insufficient documentation

## 2019-05-31 LAB — SARS CORONAVIRUS 2 (TAT 6-24 HRS): SARS Coronavirus 2: NEGATIVE

## 2019-06-01 ENCOUNTER — Other Ambulatory Visit (INDEPENDENT_AMBULATORY_CARE_PROVIDER_SITE_OTHER): Payer: Self-pay | Admitting: Nurse Practitioner

## 2019-06-01 DIAGNOSIS — N186 End stage renal disease: Secondary | ICD-10-CM | POA: Diagnosis not present

## 2019-06-01 DIAGNOSIS — D631 Anemia in chronic kidney disease: Secondary | ICD-10-CM | POA: Diagnosis not present

## 2019-06-01 DIAGNOSIS — E1129 Type 2 diabetes mellitus with other diabetic kidney complication: Secondary | ICD-10-CM | POA: Diagnosis not present

## 2019-06-01 DIAGNOSIS — D509 Iron deficiency anemia, unspecified: Secondary | ICD-10-CM | POA: Diagnosis not present

## 2019-06-01 DIAGNOSIS — Z992 Dependence on renal dialysis: Secondary | ICD-10-CM | POA: Diagnosis not present

## 2019-06-01 DIAGNOSIS — N2581 Secondary hyperparathyroidism of renal origin: Secondary | ICD-10-CM | POA: Diagnosis not present

## 2019-06-01 IMAGING — US US RENAL
1 series · 14 of 25 positions shown · non-contrast
Comparison: Renal ultrasound 11/22/2014

CLINICAL DATA: Acute renal failure.

EXAM:
RENAL / URINARY TRACT ULTRASOUND COMPLETE

[Series 1: us renal · 0.30mm/px · 14 of 32 slices shown]
[im 1/32]
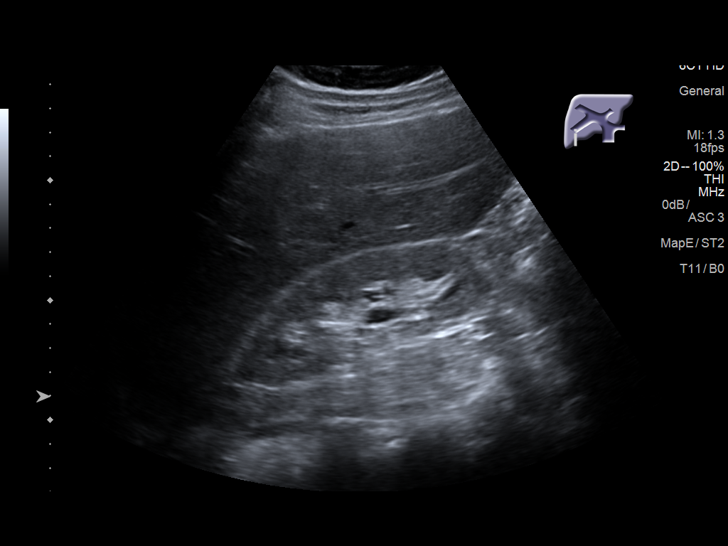
[im 3/32]
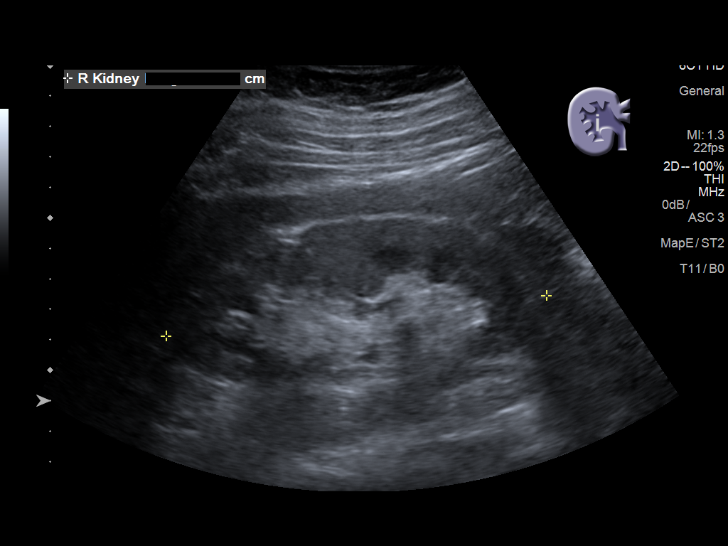
[im 6/32]
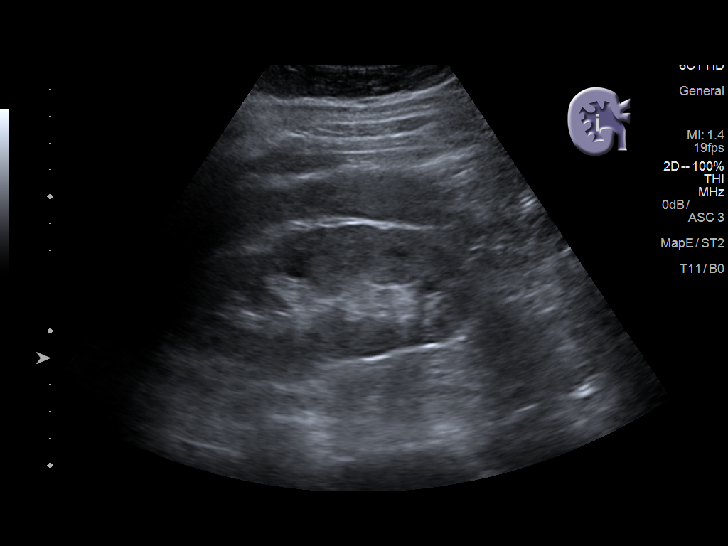
[im 8/32]
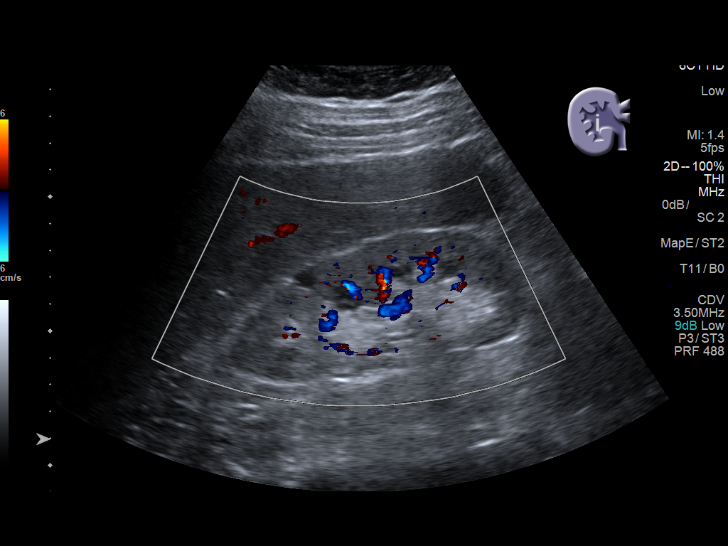
[im 11/32]
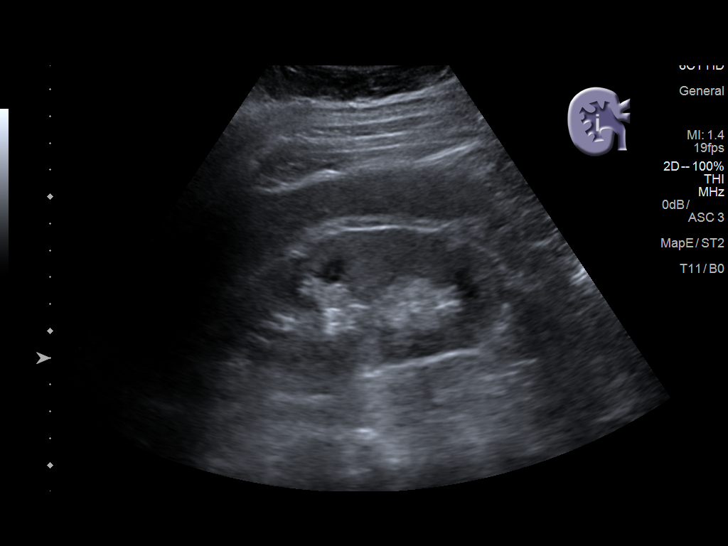
[im 12/32]
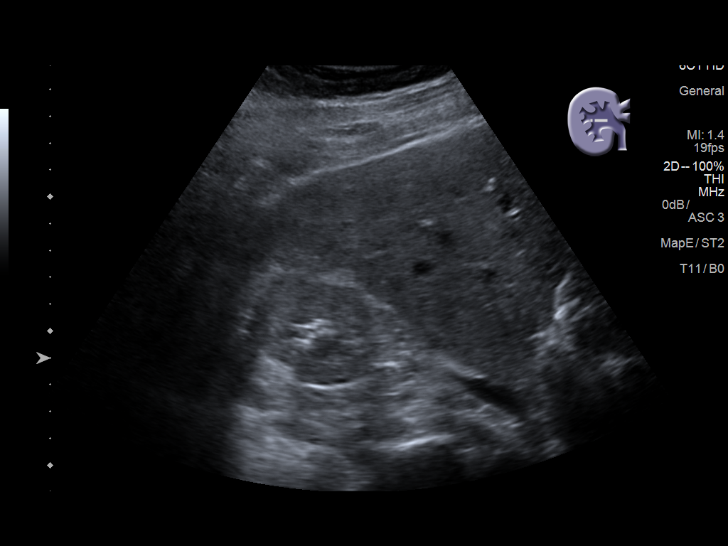
[im 15/32]
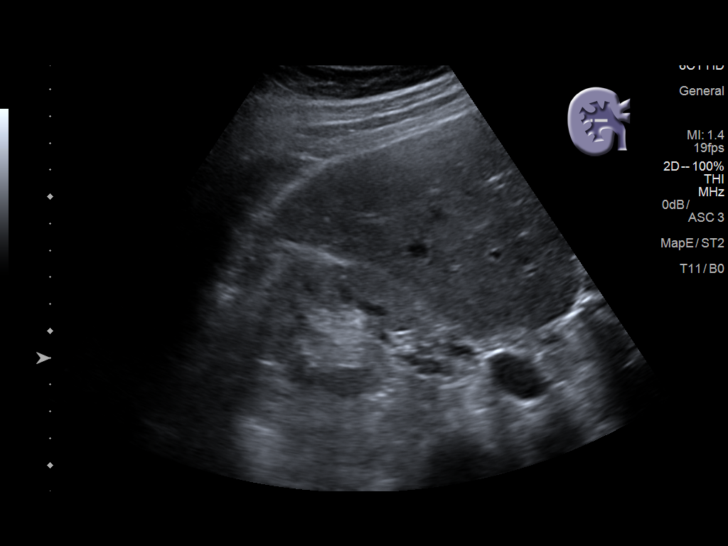
[im 17/32]
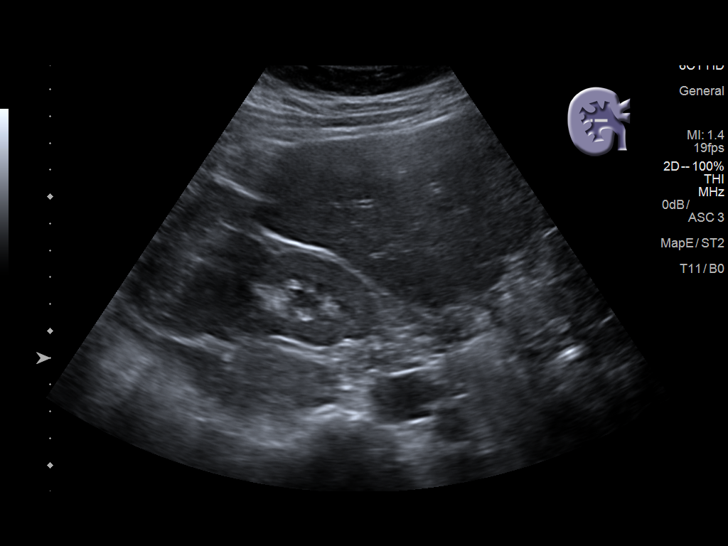
[im 20/32]
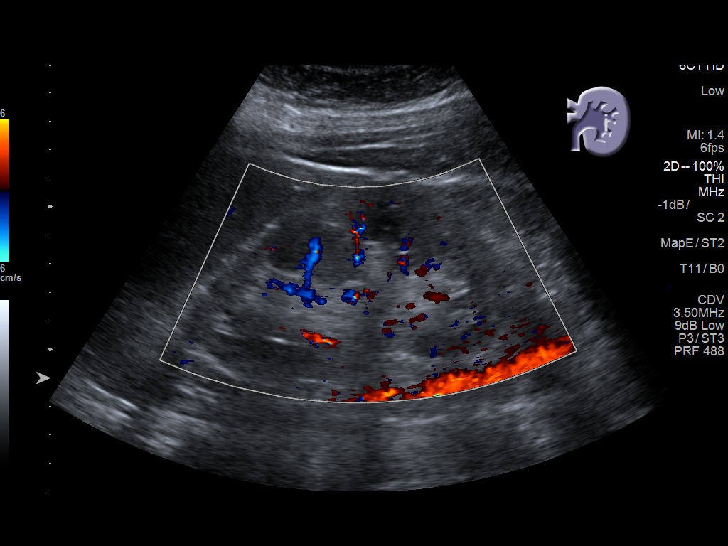
[im 21/32]
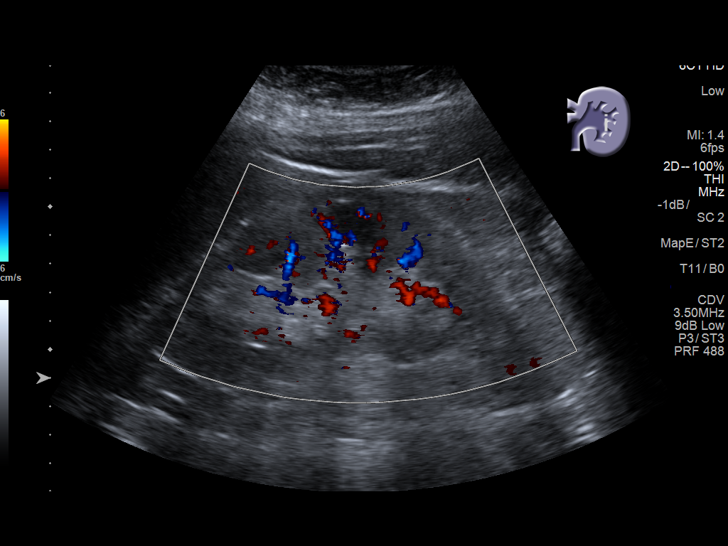
[im 24/32]
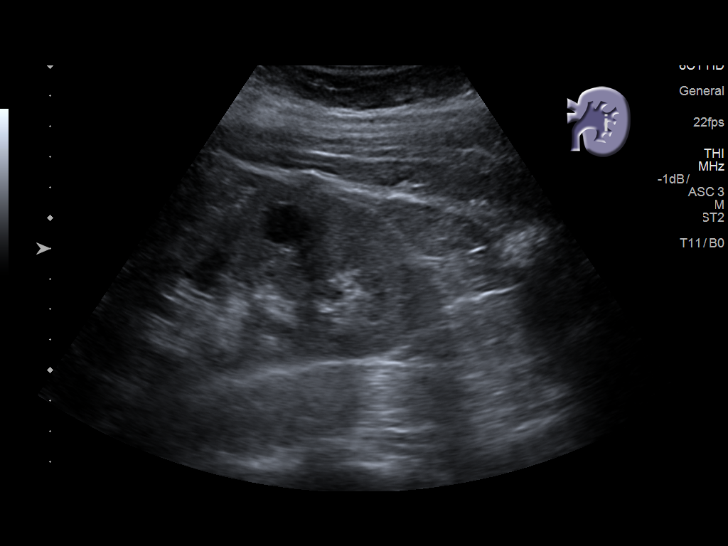
[im 26/32]
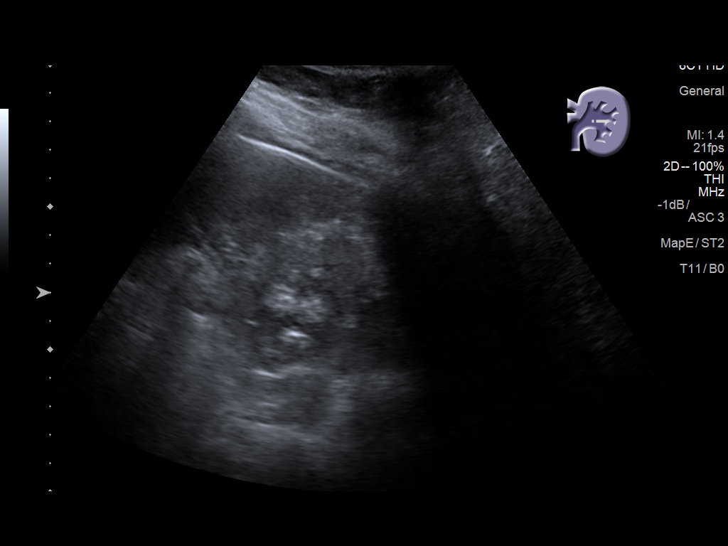
[im 29/32]
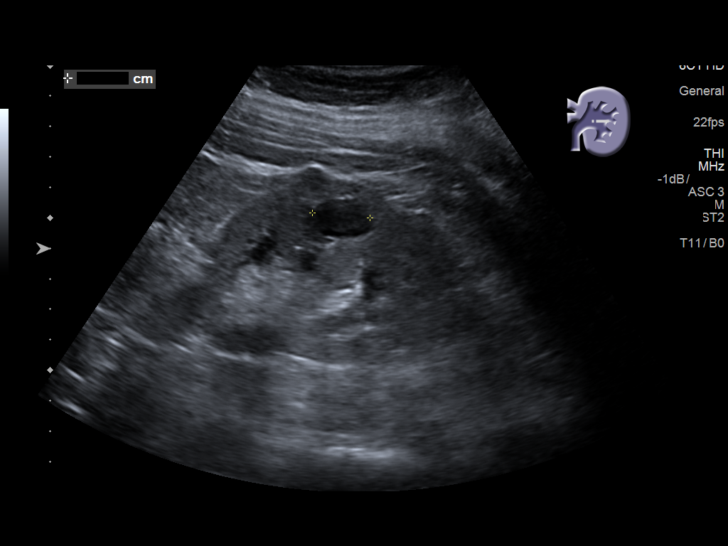
[im 32/32]
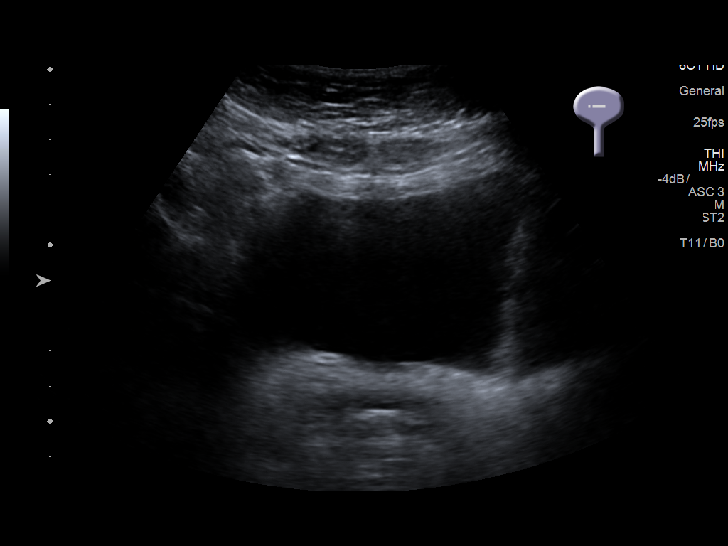

[14 of 25 positions shown; findings below may reference images not displayed]

FINDINGS: Right Kidney:

Length: 12.6 cm. Increased renal echogenicity. Cyst in the mid upper
kidney measures 11 mm. Additional small cyst on prior exam is not
well seen. No solid mass or hydronephrosis visualized.

Left Kidney:

Length: 12.5 cm. Increased renal echogenicity. Cyst in the mid
kidney measures 1.9 cm. Additional small cysts on prior exam are not
as well seen. No solid mass or hydronephrosis visualized.

Bladder:

Appears normal for degree of bladder distention.
IMPRESSION: 1. Increased renal echogenicity consistent with medical renal
disease. No hydronephrosis.
2. Small bilateral renal cysts.

## 2019-06-02 ENCOUNTER — Telehealth (INDEPENDENT_AMBULATORY_CARE_PROVIDER_SITE_OTHER): Payer: Self-pay

## 2019-06-02 ENCOUNTER — Other Ambulatory Visit: Payer: Self-pay

## 2019-06-02 ENCOUNTER — Ambulatory Visit
Admission: RE | Admit: 2019-06-02 | Discharge: 2019-06-02 | Disposition: A | Discharge status: Home / Self Care | Payer: Medicare Other | Attending: Vascular Surgery | Admitting: Vascular Surgery

## 2019-06-02 ENCOUNTER — Encounter
Admission: RE | Disposition: A | Discharge status: Home / Self Care | Payer: Self-pay | Source: Home / Self Care | Attending: Vascular Surgery

## 2019-06-02 DIAGNOSIS — Z7982 Long term (current) use of aspirin: Secondary | ICD-10-CM | POA: Insufficient documentation

## 2019-06-02 DIAGNOSIS — Y841 Kidney dialysis as the cause of abnormal reaction of the patient, or of later complication, without mention of misadventure at the time of the procedure: Secondary | ICD-10-CM | POA: Insufficient documentation

## 2019-06-02 DIAGNOSIS — J45909 Unspecified asthma, uncomplicated: Secondary | ICD-10-CM | POA: Insufficient documentation

## 2019-06-02 DIAGNOSIS — I2510 Atherosclerotic heart disease of native coronary artery without angina pectoris: Secondary | ICD-10-CM | POA: Insufficient documentation

## 2019-06-02 DIAGNOSIS — Z79899 Other long term (current) drug therapy: Secondary | ICD-10-CM | POA: Diagnosis not present

## 2019-06-02 DIAGNOSIS — I252 Old myocardial infarction: Secondary | ICD-10-CM | POA: Insufficient documentation

## 2019-06-02 DIAGNOSIS — N186 End stage renal disease: Secondary | ICD-10-CM

## 2019-06-02 DIAGNOSIS — K219 Gastro-esophageal reflux disease without esophagitis: Secondary | ICD-10-CM | POA: Diagnosis not present

## 2019-06-02 DIAGNOSIS — I5042 Chronic combined systolic (congestive) and diastolic (congestive) heart failure: Secondary | ICD-10-CM | POA: Diagnosis not present

## 2019-06-02 DIAGNOSIS — T82510S Breakdown (mechanical) of surgically created arteriovenous fistula, sequela: Secondary | ICD-10-CM | POA: Diagnosis not present

## 2019-06-02 DIAGNOSIS — Z87891 Personal history of nicotine dependence: Secondary | ICD-10-CM | POA: Diagnosis not present

## 2019-06-02 DIAGNOSIS — I132 Hypertensive heart and chronic kidney disease with heart failure and with stage 5 chronic kidney disease, or end stage renal disease: Secondary | ICD-10-CM | POA: Diagnosis not present

## 2019-06-02 DIAGNOSIS — Z992 Dependence on renal dialysis: Secondary | ICD-10-CM | POA: Diagnosis not present

## 2019-06-02 DIAGNOSIS — E1122 Type 2 diabetes mellitus with diabetic chronic kidney disease: Secondary | ICD-10-CM | POA: Insufficient documentation

## 2019-06-02 DIAGNOSIS — T82898A Other specified complication of vascular prosthetic devices, implants and grafts, initial encounter: Secondary | ICD-10-CM

## 2019-06-02 DIAGNOSIS — Z794 Long term (current) use of insulin: Secondary | ICD-10-CM | POA: Insufficient documentation

## 2019-06-02 HISTORY — PX: A/V FISTULAGRAM: CATH118298

## 2019-06-02 LAB — POTASSIUM (ARMC VASCULAR LAB ONLY): Potassium (ARMC vascular lab): 3.6 (ref 3.5–5.1)

## 2019-06-02 LAB — GLUCOSE, CAPILLARY: Glucose-Capillary: 289 mg/dL — ABNORMAL HIGH (ref 70–99)

## 2019-06-02 SURGERY — A/V FISTULAGRAM
Anesthesia: Moderate Sedation | Laterality: Left

## 2019-06-02 MED ORDER — CLINDAMYCIN PHOSPHATE 300 MG/50ML IV SOLN
INTRAVENOUS | Status: AC
  Administered 2019-06-02: 300 mg via INTRAVENOUS
  Filled 2019-06-02: qty 50

## 2019-06-02 MED ORDER — OXYCODONE-ACETAMINOPHEN 5-325 MG PO TABS: 1.0000 | ORAL_TABLET | Freq: Four times a day (QID) | ORAL | 0 refills | Status: DC | PRN

## 2019-06-02 MED ORDER — HYDROMORPHONE HCL 1 MG/ML IJ SOLN: 1.0000 mg | Freq: Once | INTRAMUSCULAR | Status: DC | PRN

## 2019-06-02 MED ORDER — DIPHENHYDRAMINE HCL 50 MG/ML IJ SOLN
50.0000 mg | Freq: Once | INTRAMUSCULAR | Status: AC | PRN
  Administered 2019-06-02: 12:00:00 50 mg via INTRAVENOUS

## 2019-06-02 MED ORDER — FAMOTIDINE 20 MG PO TABS
ORAL_TABLET | ORAL | Status: AC
  Administered 2019-06-02: 40 mg via ORAL
  Filled 2019-06-02: qty 2

## 2019-06-02 MED ORDER — DIPHENHYDRAMINE HCL 50 MG/ML IJ SOLN
INTRAMUSCULAR | Status: AC
  Administered 2019-06-02: 50 mg via INTRAVENOUS
  Filled 2019-06-02: qty 1

## 2019-06-02 MED ORDER — MIDAZOLAM HCL 5 MG/5ML IJ SOLN
INTRAMUSCULAR | Status: AC
  Filled 2019-06-02: qty 5

## 2019-06-02 MED ORDER — MIDAZOLAM HCL 2 MG/2ML IJ SOLN
INTRAMUSCULAR | Status: DC | PRN
  Administered 2019-06-02: 2 mg via INTRAVENOUS

## 2019-06-02 MED ORDER — METHYLPREDNISOLONE SODIUM SUCC 125 MG IJ SOLR
125.0000 mg | Freq: Once | INTRAMUSCULAR | Status: AC | PRN
  Administered 2019-06-02: 12:00:00 125 mg via INTRAVENOUS

## 2019-06-02 MED ORDER — FENTANYL CITRATE (PF) 100 MCG/2ML IJ SOLN
INTRAMUSCULAR | Status: AC
  Filled 2019-06-02: qty 2

## 2019-06-02 MED ORDER — ONDANSETRON HCL 4 MG/2ML IJ SOLN: 4.0000 mg | Freq: Four times a day (QID) | INTRAMUSCULAR | Status: DC | PRN

## 2019-06-02 MED ORDER — HEPARIN SODIUM (PORCINE) 1000 UNIT/ML IJ SOLN
INTRAMUSCULAR | Status: AC
  Filled 2019-06-02: qty 1

## 2019-06-02 MED ORDER — SODIUM CHLORIDE 0.9 % IV SOLN
INTRAVENOUS | Status: DC
  Administered 2019-06-02: 12:00:00 via INTRAVENOUS

## 2019-06-02 MED ORDER — METHYLPREDNISOLONE SODIUM SUCC 125 MG IJ SOLR
INTRAMUSCULAR | Status: AC
  Administered 2019-06-02: 125 mg via INTRAVENOUS
  Filled 2019-06-02: qty 2

## 2019-06-02 MED ORDER — FAMOTIDINE 20 MG PO TABS
40.0000 mg | ORAL_TABLET | Freq: Once | ORAL | Status: AC | PRN
  Administered 2019-06-02: 12:00:00 40 mg via ORAL

## 2019-06-02 MED ORDER — CLINDAMYCIN PHOSPHATE 300 MG/50ML IV SOLN
300.0000 mg | Freq: Once | INTRAVENOUS | Status: AC
  Administered 2019-06-02: 14:00:00 300 mg via INTRAVENOUS

## 2019-06-02 MED ORDER — FENTANYL CITRATE (PF) 100 MCG/2ML IJ SOLN
INTRAMUSCULAR | Status: DC | PRN
  Administered 2019-06-02: 50 ug via INTRAVENOUS

## 2019-06-02 MED ORDER — MIDAZOLAM HCL 2 MG/ML PO SYRP: 8.0000 mg | ORAL_SOLUTION | Freq: Once | ORAL | Status: DC | PRN

## 2019-06-02 SURGICAL SUPPLY — 8 items
COVER PROBE U/S 5X48 (MISCELLANEOUS) ×1 IMPLANT
DRAPE BRACHIAL (DRAPES) ×1 IMPLANT
NDL ENTRY 21GA 7CM ECHOTIP (NEEDLE) IMPLANT
NEEDLE ENTRY 21GA 7CM ECHOTIP (NEEDLE) ×4 IMPLANT
PACK ANGIOGRAPHY (CUSTOM PROCEDURE TRAY) ×2 IMPLANT
SET INTRO CAPELLA COAXIAL (SET/KITS/TRAYS/PACK) ×1 IMPLANT
SHEATH BRITE TIP 6FRX5.5 (SHEATH) ×1 IMPLANT
SUT MNCRL AB 4-0 PS2 18 (SUTURE) ×1 IMPLANT

## 2019-06-02 NOTE — Op Note (Deleted)
OPERATIVE NOTE   PROCEDURE: 1. Creation of right arm prosthetic A-V graft, HERO graft 2. Placement of venous outflow, intravascular portion of HERO graft 3. Introduction catheter into superior vena cava 4. Placement of a left internal jugular tunneled dialysis catheter with ultrasound and fluoroscopic guidance 5. 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  Central venous stricture noted greater than 80%  SPECIMEN(S):  None  INDICATIONS:   [name] is a 63 y.o. y.o. female who presents with catheter based access. The patient has undergone evaluation in preparation for a upper arm access. The patient appears to be a candidate for a HERO graft. The risks and benefits as well as alternatives have been reviewed in detail with the patient. The patient has had multiple upper extremity access in the past. All questions are answered. Patient voices understanding and wishes to proceed with right arm hero graft area  DESCRIPTION: After obtaining full informed written consent, the patient was brought back to the operating room and placed supine upon the operating table.  The patient received IV antibiotics prior to induction.  After obtaining adequate anesthesia, the patient was positioned supine with his neck extended slightly and rotated to the right the left neck chest wall is then prepped and draped in the standard fashion appropriate time out is called.  Ultrasound was placed in a sterile sleeve.  Ultrasound was utilized to avoid vascular injury and secondary to lack of appropriate landmarks.  The left internal jugular vein is identified.  It  is echolucent and compressible indicating patency.  Image is recorded for the permanent record.  Under direct ultrasound visualization a microneedle is inserted into the jugular vein microwire is then advanced.  Under fluoroscopy the wire is noted to course into the central venous system.  It does extend somewhat farther to the patient's right when compared to the existing tunneled catheter and therefore once the micro sheath is been inserted the microwire was removed and hand-injection of contrast is performed.  This demonstrates extensive tributary secondary or central venous stenosis.  Given this finding the stiff angle Glidewire is introduced and then an 8 [place] sheath is introduced.  The wire is then negotiated with the catheter into the inferior vena cava.  A 23 cm tip to cuff Cannon catheter is then selected the 8 French sheath was removed and a peel-away sheath with the dilator is advanced over the wire under fluoroscopic guidance.  The dilator was then removed and the catheter is advanced over the wire through the peel-away sheath peel-away sheath is removed.  And under fluoroscopic guidance the catheter tip is positioned in the center of the atrium.  The wire was then removed.  The catheter is approximated to the chest wall exit site is selected a small incision is created and the catheter is then pulled subcutaneously with the tunneling device.  The hub assembly is then connected.  Both lumens aspirated flushed easily.  Count to her the catheter as well as tip location was verified with fluoroscopy and then each lumen is packed with 5000 units of heparin.  Having secured tunneled catheter access for dialysis while the patient's hero graft is healing I turned my attention of the right side for placement  of the hero graft.  The existing tunneled catheter is then identified at the base of the neck.  Small incision is created and the dissection carried down to expose the catheter which was grasped with a  hemostat.  Is then transected and an Amplatz wire was then advanced through the tunneled catheter.  The tunnel catheter is then removed and an 8 [place] sheath is placed. Using a Kumpe catheter and the Amplatz wire the  wire and catheter negotiated into the inferior vena cava to apply allow for a stable rail.  The remaining segment of the catheter is then addressed the cuff which is very close to the skin is dissected circumferentially and the remaining portion of the catheter is removed without difficulty.  Pressure was held at the exit site.  The venous outflow component and the associated sheath and dilators are then opened on the sterile field and prepped in the usual fashion. 8 French sheath was removed and the dilators were advanced over the wire and subsequently the dilator and peel-away sheath is inserted under direct fluoroscopic guidance. The venous outflow portion of the HERO graft is then advanced over the wire through the peel-away sheath without difficulty and under fluoroscopic guidance positioned with its tip well into the inferior vena cava. Peel-away sheath is removed as is the dilator wire and the outflow component is flushed copiously with heparinized saline and clamped.  The venous outflow component and the associated sheath and dilators are then opened on the sterile field and prepped in the usual fashion. 8 French sheath was removed and the dilators were advanced over the wire and subsequently the dilator and peel-away sheath is inserted under direct fluoroscopic guidance. The venous outflow portion of the HERO graft is then advanced over the wire through the peel-away sheath without difficulty and under fluoroscopic guidance positioned with its tip well into the inferior vena cava. Peel-away sheath is removed as is the dilator wire and the outflow component is flushed copiously with heparinized saline and clamped.  Attention is then turned to the brachial artery. A linear incision is  made overlying the brachial impulse approximately 1 fingerbreadth above the antecubital crease.  Dissection is carried down through the soft tissues.  Fascia is opened linearly and the neurovascular bundle is identified.  The patient is found to have a high takeoff of the radial artery.  Radial artery is the more superficial of the 2 and is quite small.  I am able to palpate the true brachial artery slightly deeper and more medially.  Brachial artery is then dissected free from the surrounding veins and tissues and looped proximally and distally with Silastic Vesseloops.  A second incision is then made in the deltopectoral groove and the dissection is carried down through the soft tissues to expose the fascia covering the pectoralis muscle. Using the Bard tunneling device a tunnel was then created from the brachial artery exposure to the deltopectoral incision. The PTFE portion of the hero is then pulled subcutaneously and the grommet is positioned in the center of the deltopectoral incision.  The intravascular portion is then pulled subcutaneously from the neck insertion site to the deltopectoral incision using a large Kelly clamp.  Radiology is returned to the OR and under real-time visualization the venous outflow component is pulled back so that the tip is in the mid atrium it is then tunneled from the neck counterincision to the deltopectoral incision and connected to the PTFE portion of the hero graft without difficulty. 0 Ethibond  is used to secure this connection.    Graft is approximated to the brachial artery and then trimmed to an appropriate bevel. Brachial artery was then controlled with the vessel loops, arteriotomy is made and extended with Potts scissors. 6-0 Prolene stay suture is placed and an end graft to side brachial artery anastomosis fashioned with CV 6 Gore suture. Flushing maneuvers were performed and flow is reestablished to the hand.    Flow was now established through the graft  and an excellent thrill is noted. Radial pulses maintain.  All 3 wounds were then irrigated with sterile saline the brachial as well as the deltopectoral incisions are closed in layers using 3-0 Vicryl for the subcutaneous layers followed by 4-0 Monocryl subcuticular to close skin. Neck counterincision was closed with 4-0 Monocryl subcuticular. Dermabond is applied to all 3 incisions.  Patient tolerated procedure well there were no immediate complications. Taken to the recovery room in good condition.  COMPLICATIONS: None  CONDITION: Good  Hortencia Pilar. [facility] Office: [phone]  06/02/2019,11:28 AM

## 2019-06-02 NOTE — Telephone Encounter (Signed)
Patient called wanting to know if she can eat or drink and take her medication. I explained that as with the procedures before she is to be NPO  8 hours before and take all meds with a small amount of water and half of her insulin dose.

## 2019-06-02 NOTE — Op Note (Signed)
OPERATIVE NOTE   PROCEDURE: 1. Contrast injection left arm basilic transposition  PRE-OPERATIVE DIAGNOSIS: Complication of dialysis access                                                       End Stage Renal Disease  POST-OPERATIVE DIAGNOSIS: same as above   SURGEON: Katha Cabal, M.D.  ANESTHESIA: Conscious sedation was administered under my direct supervision by the interventional radiology RN.  IV Versed plus fentanyl were utilized. Continuous ECG, pulse oximetry and blood pressure was monitored throughout the entire procedure.  Conscious sedation was for a total of 25 minutes.  ESTIMATED BLOOD LOSS: minimal  FINDING(S): 1. Widely patent basilic transposition.  The previously placed stent is in good position and free of any recurrent stenosis.  Central veins are widely patent.  The fistula is marked with an indelible marker and transverse #marks are made to correlate with ultrasound pictures showing the size and depth of the fistula as well.  SPECIMEN(S):  None  CONTRAST: 20 cc  FLUOROSCOPY TIME: 0.8 minutes  INDICATIONS: [name] is a 63 y.o. female who  presents with malfunctioning left arm AV access.  The patient is scheduled for angiography with possible intervention of the AV access to prevent loss of the permanent access.  The patient is aware the risks include but are not limited to: bleeding, infection, thrombosis of the cannulated access, and possible anaphylactic reaction to the contrast.  The patient acknowledges if the access can not be salvaged a tunneled catheter will be needed and will be placed during this procedure.  The patient is aware of the risks of the procedure and elects to proceed with the angiogram and intervention.  DESCRIPTION: After full informed written consent was obtained, the patient was brought back to the Special Procedure suite and placed supine position.  Appropriate cardiopulmonary monitors were placed.  The left arm was prepped and  draped in the standard fashion.  Appropriate timeout is called.   The left basilic transposition access was cannulated with a micropuncture needle under ultrasound guidence.  Ultrasound was used to evaluate the left arm access.  It was echolucent and compressible indicating it is patent .  An ultrasound image was acquired for the permanent record.  A micropuncture needle was used to access the left basilic transposition access under direct ultrasound guidance.  The microwire was then advanced under fluoroscopic guidance without difficulty followed by the micro-sheath.  Hand injections were completed to image the access from the arterial anastomosis through the entire access.  The central venous structures were also imaged by hand injections.  Based on the images, fistula is widely patent.  The vein itself measures approximately 10 mm throughout its course.  It remains less than 5 mm deep over a length of approximately 15 cm.  The previously placed stent is widely patent.  No evidence of recurrent stenosis.  The central veins are widely patent.  Arterial anastomosis is widely patent..    A 4-0 Monocryl purse-string suture was sewn around the sheath.  The sheath was removed and light pressure was applied.  A sterile bandage was applied to the puncture site.  Post procedure the fistula itself is marked with an indelible marker.  #Marks are made with ultrasound pictures to correlate to demonstrate the depth and size of the vein.  COMPLICATIONS: None  CONDITION: [name], M.D [facility] Office: [phone]  06/02/2019 2:27 PM

## 2019-06-03 DIAGNOSIS — Z992 Dependence on renal dialysis: Secondary | ICD-10-CM | POA: Diagnosis not present

## 2019-06-03 DIAGNOSIS — E1129 Type 2 diabetes mellitus with other diabetic kidney complication: Secondary | ICD-10-CM | POA: Diagnosis not present

## 2019-06-03 DIAGNOSIS — D509 Iron deficiency anemia, unspecified: Secondary | ICD-10-CM | POA: Diagnosis not present

## 2019-06-03 DIAGNOSIS — N2581 Secondary hyperparathyroidism of renal origin: Secondary | ICD-10-CM | POA: Diagnosis not present

## 2019-06-03 DIAGNOSIS — N186 End stage renal disease: Secondary | ICD-10-CM | POA: Diagnosis not present

## 2019-06-03 DIAGNOSIS — D631 Anemia in chronic kidney disease: Secondary | ICD-10-CM | POA: Diagnosis not present

## 2019-06-05 ENCOUNTER — Encounter: Payer: Self-pay | Admitting: Vascular Surgery

## 2019-06-05 DIAGNOSIS — D631 Anemia in chronic kidney disease: Secondary | ICD-10-CM | POA: Diagnosis not present

## 2019-06-05 DIAGNOSIS — N2581 Secondary hyperparathyroidism of renal origin: Secondary | ICD-10-CM | POA: Diagnosis not present

## 2019-06-05 DIAGNOSIS — E1129 Type 2 diabetes mellitus with other diabetic kidney complication: Secondary | ICD-10-CM | POA: Diagnosis not present

## 2019-06-05 DIAGNOSIS — D509 Iron deficiency anemia, unspecified: Secondary | ICD-10-CM | POA: Diagnosis not present

## 2019-06-05 DIAGNOSIS — N186 End stage renal disease: Secondary | ICD-10-CM | POA: Diagnosis not present

## 2019-06-05 DIAGNOSIS — Z992 Dependence on renal dialysis: Secondary | ICD-10-CM | POA: Diagnosis not present

## 2019-06-05 NOTE — Telephone Encounter (Signed)
Patient left a message on the surgical line after 4:30 ( I leave at 4:30 )on 06/02/2019 which was after her fistulagram with Dr. Delana Meyer. Patient was requesting some pain medication be called into her pharmacy. I returned the call this morning after speaking with Dr. Delana Meyer. Per Dr. Delana Meyer they do not give pain medication after a fistulagram. I left a message for the patient stating this and that she could possibly take Tylenol or Ibuprofen for any pain. I advised if she had any questions to return my call.

## 2019-06-07 DIAGNOSIS — N2581 Secondary hyperparathyroidism of renal origin: Secondary | ICD-10-CM | POA: Diagnosis not present

## 2019-06-07 DIAGNOSIS — D631 Anemia in chronic kidney disease: Secondary | ICD-10-CM | POA: Diagnosis not present

## 2019-06-07 DIAGNOSIS — Z992 Dependence on renal dialysis: Secondary | ICD-10-CM | POA: Diagnosis not present

## 2019-06-07 DIAGNOSIS — N186 End stage renal disease: Secondary | ICD-10-CM | POA: Diagnosis not present

## 2019-06-07 DIAGNOSIS — E1129 Type 2 diabetes mellitus with other diabetic kidney complication: Secondary | ICD-10-CM | POA: Diagnosis not present

## 2019-06-07 DIAGNOSIS — D509 Iron deficiency anemia, unspecified: Secondary | ICD-10-CM | POA: Diagnosis not present

## 2019-06-16 ENCOUNTER — Other Ambulatory Visit: Payer: Self-pay | Admitting: Physician Assistant

## 2019-06-16 DIAGNOSIS — E1142 Type 2 diabetes mellitus with diabetic polyneuropathy: Secondary | ICD-10-CM

## 2019-06-16 DIAGNOSIS — M1A39X0 Chronic gout due to renal impairment, multiple sites, without tophus (tophi): Secondary | ICD-10-CM

## 2019-06-16 DIAGNOSIS — E559 Vitamin D deficiency, unspecified: Secondary | ICD-10-CM

## 2019-06-16 NOTE — Telephone Encounter (Signed)
Requested medication (s) are due for refill today:yes  Requested medication (s) are on the active medication list:yes  Last refill:  05/19/2019  Future visit scheduled: no  Notes to clinic: not delegated    Requested Prescriptions  Pending Prescriptions Disp Refills   Vitamin D, Ergocalciferol, (DRISDOL) 1.25 MG (50000 UT) CAPS capsule [Pharmacy Med Name: VIT D2 ERGOCAL (50,000 UNIT)] 1 capsule 0    Sig: TAKE 1 CAPSULE BY MOUTH ONCE A MONTH     Endocrinology:  Vitamins - Vitamin D Supplementation Failed - 06/16/2019  3:21 PM      Failed - 50,000 IU strengths are not delegated      Failed - Ca in normal range and within 360 days    Calcium  Date Value Ref Range Status  04/04/2019 8.2 (L) 8.9 - 10.3 mg/dL Final         Failed - Vitamin D in normal range and within 360 days    No results found for: [id number], [id number], [id number], 25OHVITD3, 25OHVITD2, 25OHVITD3, 25OHVITD2, 25OHVITD1, 25OHVITD2, 25OHVITD3, VD25OH       Passed - Phosphate in normal range and within 360 days    Phosphorus  Date Value Ref Range Status  01/01/2019 2.8 2.5 - 4.6 mg/dL Final         Passed - Valid encounter within last 12 months    Recent Outpatient Visits          1 month ago Acute right-sided low back pain without sciatica   [place], [place]   4 months ago Chronic hepatitis C without hepatic coma [facility])   [place], Clearnce Sorrel, PA-C   1 year ago ESRD on dialysis [facility])   [place], [place], [place]   1 year ago Diarrhea, unspecified type   [place], [place], PA-C   1 year ago Type 2 diabetes mellitus with stage 4 chronic kidney disease, with long-term current use of insulin [facility])   [facility] [place], [place], [place]

## 2019-06-16 NOTE — Telephone Encounter (Signed)
Requested medication (s) are due for refill today: yes  Requested medication (s) are on the active medication list: yes  Last refill:  05/19/2019  Future visit scheduled: no  Notes to clinic:  Review for refill   Requested Prescriptions  Pending Prescriptions Disp Refills   allopurinol (ZYLOPRIM) 100 MG tablet [Pharmacy Med Name: ALLOPURINOL 100 MG TABLET] 28 tablet 0    Sig: TAKE 1 TABLET BY MOUTH ONCE DAILY.     Endocrinology:  Gout Agents Failed - 06/16/2019  3:21 PM      Failed - Uric Acid in normal range and within 360 days    Uric Acid, Serum  Date Value Ref Range Status  12/27/2017 5.8 2.3 - 6.6 mg/dL Final    Comment:    Performed at [facility], [place] [address] [place], [place] [address]   Uric Acid  Date Value Ref Range Status  02/12/2017 12.1 (H) 2.5 - 7.1 mg/dL Final    Comment:               Therapeutic target for gout patients: <6.0         Failed - Cr in normal range and within 360 days    Creat  Date Value Ref Range Status  09/23/2016 1.36 (H) 0.50 - 1.05 mg/dL Final    Comment:      For patients > or = 61 years of age: The upper reference limit for Creatinine is approximately 13% higher for people identified as African-American.      Creatinine, Ser  Date Value Ref Range Status  04/04/2019 3.59 (H) 0.44 - 1.00 mg/dL Final         Passed - Valid encounter within last 12 months    Recent Outpatient Visits          1 month ago Acute right-sided low back pain without sciatica   [place], [place]   4 months ago Chronic hepatitis C without hepatic coma [facility])   [place], Clearnce Sorrel, PA-C   1 year ago ESRD on dialysis [facility])   [place], [place], [place]   1 year ago Diarrhea, unspecified type   [place], Adriana M, PA-C   1 year ago Type 2 diabetes mellitus with stage 4 chronic kidney disease, with long-term current use of  insulin [facility])   [place], PA-C              pregabalin (LYRICA) 75 MG capsule [Pharmacy Med Name: PREGABALIN 75 MG CAPSULE] 56 capsule 0    Sig: TAKE (1) CAPSULE BY MOUTH TWICE DAILY.     Not Delegated - Neurology:  Anticonvulsants - Controlled Failed - 06/16/2019  3:21 PM      Failed - This refill cannot be delegated      Passed - Valid encounter within last 12 months    Recent Outpatient Visits          1 month ago Acute right-sided low back pain without sciatica   [place], [place]   4 months ago Chronic hepatitis C without hepatic coma [facility])   [place], Clearnce Sorrel, [place]   1 year ago ESRD on dialysis [facility])   [place], [place], [place]   1 year ago Diarrhea, unspecified type   [place], [place], PA-C   1 year ago Type 2 diabetes mellitus with  stage 4 chronic kidney disease, with long-term current use of insulin [facility])   [facility] [place], [place], [place]

## 2019-06-19 ENCOUNTER — Ambulatory Visit (INDEPENDENT_AMBULATORY_CARE_PROVIDER_SITE_OTHER): Payer: Medicare Other | Admitting: Vascular Surgery

## 2019-06-19 ENCOUNTER — Encounter (INDEPENDENT_AMBULATORY_CARE_PROVIDER_SITE_OTHER): Payer: Medicare Other

## 2019-06-19 ENCOUNTER — Other Ambulatory Visit (INDEPENDENT_AMBULATORY_CARE_PROVIDER_SITE_OTHER): Payer: Self-pay | Admitting: Vascular Surgery

## 2019-06-19 ENCOUNTER — Encounter (INDEPENDENT_AMBULATORY_CARE_PROVIDER_SITE_OTHER): Payer: Self-pay

## 2019-06-19 DIAGNOSIS — N186 End stage renal disease: Secondary | ICD-10-CM

## 2019-06-19 DIAGNOSIS — T829XXS Unspecified complication of cardiac and vascular prosthetic device, implant and graft, sequela: Secondary | ICD-10-CM

## 2019-06-26 ENCOUNTER — Ambulatory Visit (INDEPENDENT_AMBULATORY_CARE_PROVIDER_SITE_OTHER): Payer: Medicare Other | Admitting: Vascular Surgery

## 2019-06-28 ENCOUNTER — Ambulatory Visit: Payer: Self-pay

## 2019-06-28 NOTE — Telephone Encounter (Signed)
  Pt. And daughter reports she started having right sided lower abdominal pain 2 weeks ago, "but it has gotten so bad it hurts to walk." Pt. Is doubled over. Has lost control of "her bowels." Slight fever. Instructed to go to ED. Verbalizes understanding. Reason for Disposition . [1] SEVERE pain AND [2] age > 23  Answer Assessment - Initial Assessment Questions 1. LOCATION: "Where does it hurt?"      Right lower abd. 2. RADIATION: "Does the pain shoot anywhere else?" (e.g., chest, back)     Back 3. ONSET: "When did the pain begin?" (e.g., minutes, hours or days ago)      2 weeks ago 4. SUDDEN: "Gradual or sudden onset?"     Gradual 5. PATTERN "Does the pain come and go, or is it constant?"    - If constant: "Is it getting better, staying the same, or worsening?"      (Note: Constant means the pain never goes away completely; most serious pain is constant and it progresses)     - If intermittent: "How long does it last?" "Do you have pain now?"     (Note: Intermittent means the pain goes away completely between bouts)     Comes and goes 6. SEVERITY: "How bad is the pain?"  (e.g., Scale 1-10; mild, moderate, or severe)   - MILD (1-3): doesn't interfere with normal activities, abdomen soft and not tender to touch    - MODERATE (4-7): interferes with normal activities or awakens from sleep, tender to touch    - SEVERE (8-10): excruciating pain, doubled over, unable to do any normal activities      412 7. RECURRENT SYMPTOM: "Have you ever had this type of abdominal pain before?" If so, ask: "When was the last time?" and "What happened that time?"      No 8. CAUSE: "What do you think is causing the abdominal pain?"     Unsure 9. RELIEVING/AGGRAVATING FACTORS: "What makes it better or worse?" (e.g., movement, antacids, bowel movement)     No 10. OTHER SYMPTOMS: "Has there been any vomiting, diarrhea, constipation, or urine problems?"       Low grade fever 11. PREGNANCY: "Is there any chance  you are pregnant?" "When was your last menstrual period?"       No  Protocols used: ABDOMINAL PAIN - [facility]

## 2019-06-28 NOTE — Telephone Encounter (Signed)
From [facility] Triage

## 2019-06-29 ENCOUNTER — Emergency Department
Admission: EM | Admit: 2019-06-29 | Discharge: 2019-06-29 | Disposition: A | Discharge status: Home / Self Care | Payer: Medicare Other | Attending: Emergency Medicine | Admitting: Emergency Medicine

## 2019-06-29 ENCOUNTER — Other Ambulatory Visit: Payer: Self-pay

## 2019-06-29 ENCOUNTER — Encounter: Payer: Self-pay | Admitting: Emergency Medicine

## 2019-06-29 ENCOUNTER — Emergency Department: Payer: Medicare Other

## 2019-06-29 DIAGNOSIS — Z79899 Other long term (current) drug therapy: Secondary | ICD-10-CM | POA: Diagnosis not present

## 2019-06-29 DIAGNOSIS — E1122 Type 2 diabetes mellitus with diabetic chronic kidney disease: Secondary | ICD-10-CM | POA: Diagnosis not present

## 2019-06-29 DIAGNOSIS — J45909 Unspecified asthma, uncomplicated: Secondary | ICD-10-CM | POA: Insufficient documentation

## 2019-06-29 DIAGNOSIS — I2510 Atherosclerotic heart disease of native coronary artery without angina pectoris: Secondary | ICD-10-CM | POA: Insufficient documentation

## 2019-06-29 DIAGNOSIS — N186 End stage renal disease: Secondary | ICD-10-CM | POA: Diagnosis not present

## 2019-06-29 DIAGNOSIS — E1140 Type 2 diabetes mellitus with diabetic neuropathy, unspecified: Secondary | ICD-10-CM | POA: Insufficient documentation

## 2019-06-29 DIAGNOSIS — I5042 Chronic combined systolic (congestive) and diastolic (congestive) heart failure: Secondary | ICD-10-CM | POA: Insufficient documentation

## 2019-06-29 DIAGNOSIS — Z955 Presence of coronary angioplasty implant and graft: Secondary | ICD-10-CM | POA: Diagnosis not present

## 2019-06-29 DIAGNOSIS — Z794 Long term (current) use of insulin: Secondary | ICD-10-CM | POA: Diagnosis not present

## 2019-06-29 DIAGNOSIS — I132 Hypertensive heart and chronic kidney disease with heart failure and with stage 5 chronic kidney disease, or end stage renal disease: Secondary | ICD-10-CM | POA: Diagnosis not present

## 2019-06-29 DIAGNOSIS — R1031 Right lower quadrant pain: Secondary | ICD-10-CM

## 2019-06-29 DIAGNOSIS — M545 Low back pain, unspecified: Secondary | ICD-10-CM

## 2019-06-29 DIAGNOSIS — F1210 Cannabis abuse, uncomplicated: Secondary | ICD-10-CM | POA: Diagnosis not present

## 2019-06-29 DIAGNOSIS — Z992 Dependence on renal dialysis: Secondary | ICD-10-CM | POA: Insufficient documentation

## 2019-06-29 DIAGNOSIS — Z7982 Long term (current) use of aspirin: Secondary | ICD-10-CM | POA: Insufficient documentation

## 2019-06-29 DIAGNOSIS — I252 Old myocardial infarction: Secondary | ICD-10-CM | POA: Insufficient documentation

## 2019-06-29 DIAGNOSIS — Z87891 Personal history of nicotine dependence: Secondary | ICD-10-CM | POA: Diagnosis not present

## 2019-06-29 LAB — CBC
HCT: 34.9 % — ABNORMAL LOW (ref 36.0–46.0)
Hemoglobin: 11.2 g/dL — ABNORMAL LOW (ref 12.0–15.0)
MCH: 31.5 pg (ref 26.0–34.0)
MCHC: 32.1 g/dL (ref 30.0–36.0)
MCV: 98.3 fL (ref 80.0–100.0)
Platelets: 174 10*3/uL (ref 150–400)
RBC: 3.55 MIL/uL — ABNORMAL LOW (ref 3.87–5.11)
RDW: 15.3 % (ref 11.5–15.5)
WBC: 7.9 10*3/uL (ref 4.0–10.5)
nRBC: 0 % (ref 0.0–0.2)

## 2019-06-29 LAB — COMPREHENSIVE METABOLIC PANEL
ALT: 32 U/L (ref 0–44)
AST: 34 U/L (ref 15–41)
Albumin: 3.7 g/dL (ref 3.5–5.0)
Alkaline Phosphatase: 281 U/L — ABNORMAL HIGH (ref 38–126)
Anion gap: 14 (ref 5–15)
BUN: 27 mg/dL — ABNORMAL HIGH (ref 8–23)
CO2: 27 mmol/L (ref 22–32)
Calcium: 8.5 mg/dL — ABNORMAL LOW (ref 8.9–10.3)
Chloride: 92 mmol/L — ABNORMAL LOW (ref 98–111)
Creatinine, Ser: 4.83 mg/dL — ABNORMAL HIGH (ref 0.44–1.00)
GFR calc Af Amer: 10 mL/min — ABNORMAL LOW (ref >60)
GFR calc non Af Amer: 9 mL/min — ABNORMAL LOW (ref >60)
Glucose, Bld: 302 mg/dL — ABNORMAL HIGH (ref 70–99)
Potassium: 3.6 mmol/L (ref 3.5–5.1)
Sodium: 133 mmol/L — ABNORMAL LOW (ref 135–145)
Total Bilirubin: 0.6 mg/dL (ref 0.3–1.2)
Total Protein: 8 g/dL (ref 6.5–8.1)

## 2019-06-29 LAB — LIPASE, BLOOD: Lipase: 41 U/L (ref 11–51)

## 2019-06-29 MED ORDER — LIDOCAINE 5 % EX PTCH
1.0000 | MEDICATED_PATCH | CUTANEOUS | Status: DC
  Administered 2019-06-29: 1 via TRANSDERMAL
  Filled 2019-06-29: qty 1

## 2019-06-29 MED ORDER — LIDOCAINE 5 % EX PTCH: 1.0000 | MEDICATED_PATCH | Freq: Two times a day (BID) | CUTANEOUS | 0 refills | Status: DC

## 2019-06-29 MED ORDER — CYCLOBENZAPRINE HCL 5 MG PO TABS: 5.0000 mg | ORAL_TABLET | Freq: Three times a day (TID) | ORAL | 0 refills | Status: DC | PRN

## 2019-06-29 MED ORDER — OXYCODONE-ACETAMINOPHEN 5-325 MG PO TABS
1.0000 | ORAL_TABLET | Freq: Once | ORAL | Status: AC
  Administered 2019-06-29: 1 via ORAL
  Filled 2019-06-29: qty 1

## 2019-06-29 NOTE — ED Provider Notes (Signed)
[facility] Emergency Department Provider Note   ____________________________________________   First MD Initiated Contact with Patient 06/29/19 1342     (approximate)  I have reviewed the triage vital signs and the nursing notes.   HISTORY  Chief Complaint Abdominal Pain    HPI [name] is a 63 y.o. female with past no history of CAD, CHF, ESRD on HD (TTS), hypertension, diabetes, who presents to the ED complaining of abdominal and back pain.  Patient reports she has been dealing with pain in her right lower back as well as the right lower quadrant of her abdomen for about the past month, however it seemed to get acutely worse over the past couple of days.  She describes it as a sharp pain that is exacerbated by movement and makes it difficult for her to get comfortable.  She has not had any fevers, chills, nausea, vomiting, cough, chest pain, shortness of breath, diarrhea, or constipation.  She completed a full dialysis treatment earlier today without issue.  She reports making urine a couple of times per day, but has not had any dysuria or hematuria.  She denies any recent trauma to her back or abdomen, states she has been able to ambulate with no weakness in her lower extremities and no saddle anesthesia.        Past Medical History:  Diagnosis Date  . Anemia   . Aortic stenosis    Echo 8/18: mean 13, peak 28, LVOT/AV mean velocity 0.51  . Arthritis   . Asthma    As a child   . Bronchitis   . CAD (coronary artery disease)    a. 09/2016: 50% Ost 1st Mrg stenosis, 50% 2nd Mrg stenosis, 20% Mid-Cx, 95% Prox LAD, 40% mid-LAD, and 10% dist-LAD stenosis. Staged PCI with DES to Prox-LAD.   [name] Chronic combined systolic and diastolic CHF (congestive heart failure) ([place]) 2011   echo 2/18: EF 55-60, normal wall motion, grade 2 diastolic dysfunction, trivial AI // echo 3/18: Septal and apical HK, EF 45-50, normal wall motion, trivial AI, mild LAE, PASP 38 //  echo 8/18: EF 60-65, normal wall motion, grade 1 diastolic dysfunction, calcified aortic valve leaflets, mild aortic stenosis (mean 13, peak 28, LVOT/AV mean velocity 0.51), mild AI, moderate MAC, mild LAE, trivial TR   . Chronic kidney disease    STAGE 4  . Chronic kidney disease on chronic dialysis (HCC)    t, th, sat  . Complication of anesthesia   . Depression   . Diabetes mellitus Dx 1989  . Elevated lipids   . GERD (gastroesophageal reflux disease)   . Gout   . Heart murmur    asymptomatic  . Hepatitis C Dx 2013  . Hypertension Dx 1989  . Infected surgical wound    Lt arm  . Myocardial infarction ([place]) 07/2015  . Obesity   . Pancreatitis 2013  . Pneumonia   . Refusal of blood transfusions as patient is Jehovah's Witness   . Tendinitis   . Tremors of nervous system    LEFT HAND  . Ulcer 2010    Patient Active Problem List   Diagnosis Date Noted  . Ascending aortic aneurysm ([place]) 03/27/2019  . Mild protein-calorie malnutrition ([place]) 01/25/2019  . Puncture wound without foreign body of left forearm, initial encounter 01/02/2019  . Non-healing wound of upper extremity 12/28/2018  . Unspecified open wound of left upper arm, initial encounter 12/06/2018  . Complication from renal dialysis device 10/20/2018  .  Coagulation defect, unspecified ([place]) 10/18/2018  . Iron deficiency anemia, unspecified 10/18/2018  . Secondary hyperparathyroidism of renal origin ([place]) 10/18/2018  . ESRD on dialysis ([place]) 06/01/2018  . ARF (acute renal failure) ([place]) 05/09/2018  . Colon cancer screening   . LGSIL on Pap smear of cervix 01/27/2018  . Gout 12/25/2017  . AKI (acute kidney injury) ([place]) 12/24/2017  . Hyperglycemia 12/24/2017  . Chronic diastolic heart failure ([place]) 09/30/2017  . Lymphedema 09/30/2017  . Aortic stenosis   . Chronic pain of right knee 02/12/2017  . Chronic gout due to renal impairment of multiple sites without tophus 02/12/2017  . Esophageal dysphagia 02/12/2017    . Osteoarthritis 01/22/2017  . Diabetic hyperosmolar non-ketotic state ([place]) 12/13/2016  . CAD (coronary artery disease) 12/13/2016  . Hyperlipidemia 12/13/2016  . Hyperphosphatemia 12/13/2016  . Asthma 11/29/2015  . Depression 09/05/2015  . GERD (gastroesophageal reflux disease) 03/25/2015  . Environmental allergies 03/14/2015  . Chronic hepatitis C without hepatic coma ([place]) 01/31/2015  . Diabetic neuropathy ([place]) 01/31/2015  . OSA (obstructive sleep apnea) 01/01/2015  . Poor dentition 11/13/2014  . Essential hypertension 10/08/2014  . Morbid (severe) obesity due to excess calories ([place]) 10/08/2014  . Diabetes mellitus type 2, uncontrolled, with complications ([place]) [id number]/86/3817    Class: Chronic    Past Surgical History:  Procedure Laterality Date  . A/V FISTULAGRAM Left 04/11/2019   Procedure: A/V FISTULAGRAM;  Surgeon: Katha Cabal, MD;  Location: [facility];  Service: Cardiovascular;  Laterality: Left;  . A/V FISTULAGRAM Left 06/02/2019   Procedure: A/V FISTULAGRAM;  Surgeon: Katha Cabal, MD;  Location: [facility];  Service: Cardiovascular;  Laterality: Left;  . APPLICATION OF WOUND VAC Left 08/14/2017   Procedure: APPLICATION OF WOUND VAC Exchange;  Surgeon: Robert Bellow, MD;  Location: [facility];  Service: General;  Laterality: Left;  . APPLICATION OF WOUND VAC Left 12/21/2018   Procedure: APPLICATION OF WOUND VAC;  Surgeon: Katha Cabal, MD;  Location: [facility];  Service: Vascular;  Laterality: Left;  . AV FISTULA PLACEMENT Left 08/19/2018   Procedure: ARTERIOVENOUS (AV) FISTULA CREATION ( BRACHIOBASILIC );  Surgeon: Katha Cabal, MD;  Location: [facility];  Service: Vascular;  Laterality: Left;  . BASCILIC VEIN TRANSPOSITION Left 11/18/2018   Procedure: BASCILIC VEIN TRANSPOSITION;  Surgeon: Katha Cabal, MD;  Location: [facility];  Service: Vascular;  Laterality: Left;  . CHOLECYSTECTOMY    . COLONOSCOPY WITH PROPOFOL N/A  02/03/2018   Procedure: COLONOSCOPY WITH PROPOFOL;  Surgeon: Lin Landsman, MD;  Location: [facility];  Service: Gastroenterology;  Laterality: N/A;  . CORONARY ANGIOPLASTY  07/2015   STENT  . CORONARY STENT INTERVENTION N/A 09/18/2016   Procedure: Coronary Stent Intervention;  Surgeon: Troy Sine, MD;  Location: [facility];  Service: Cardiovascular;  Laterality: N/A;  . DIALYSIS/PERMA CATHETER INSERTION N/A 05/10/2018   Procedure: DIALYSIS/PERMA CATHETER INSERTION;  Surgeon: Katha Cabal, MD;  Location: [facility];  Service: Cardiovascular;  Laterality: N/A;  . DRESSING CHANGE UNDER ANESTHESIA Left 08/15/2017   Procedure: exploration of wound for bleeding;  Surgeon: Robert Bellow, MD;  Location: [facility];  Service: General;  Laterality: Left;  . ESOPHAGOGASTRODUODENOSCOPY (EGD) WITH PROPOFOL N/A 02/03/2018   Procedure: ESOPHAGOGASTRODUODENOSCOPY (EGD) WITH PROPOFOL;  Surgeon: Lin Landsman, MD;  Location: [facility];  Service: Gastroenterology;  Laterality: N/A;  . EYE SURGERY  11/17/2018  . INCISION AND DRAINAGE ABSCESS Left 08/12/2017   Procedure: INCISION  AND DRAINAGE ABSCESS;  Surgeon: Robert Bellow, MD;  Location: [facility];  Service: General;  Laterality: Left;  . KNEE ARTHROSCOPY    . LEFT HEART CATH N/A 09/18/2016   Procedure: Left Heart Cath;  Surgeon: Troy Sine, MD;  Location: [facility];  Service: Cardiovascular;  Laterality: N/A;  . LEFT HEART CATH AND CORONARY ANGIOGRAPHY N/A 09/16/2016   Procedure: Left Heart Cath and Coronary Angiography;  Surgeon: Burnell Blanks, MD;  Location: [facility];  Service: Cardiovascular;  Laterality: N/A;  . LEFT HEART CATH AND CORONARY ANGIOGRAPHY N/A 04/29/2017   Procedure: LEFT HEART CATH AND CORONARY ANGIOGRAPHY;  Surgeon: Nelva Bush, MD;  Location: [facility];  Service: Cardiovascular;  Laterality: N/A;  . LOWER EXTREMITY ANGIOGRAPHY Right 03/08/2018    Procedure: LOWER EXTREMITY ANGIOGRAPHY;  Surgeon: Katha Cabal, MD;  Location: [facility];  Service: Cardiovascular;  Laterality: Right;  . TUBAL LIGATION    . TUBAL LIGATION    . WOUND DEBRIDEMENT Left 12/21/2018   Procedure: DEBRIDEMENT WOUND;  Surgeon: Katha Cabal, MD;  Location: [facility];  Service: Vascular;  Laterality: Left;  . WOUND DEBRIDEMENT Left 12/30/2018   Procedure: DEBRIDEMENT WOUND WITH VAC PLACEMENT (LEFT UPPER EXTREMITY);  Surgeon: Katha Cabal, MD;  Location: [facility];  Service: Vascular;  Laterality: Left;    Prior to Admission medications   Medication Sig Start Date End Date Taking? Authorizing Provider  Accu-Chek FastClix Lancets MISC USE AS DIRECTED TO CHECK BLOOD SUGAR THREE TIMES DAILY AND AT BEDTIME 09/28/18   [name], PA-C  albuterol (VENTOLIN HFA) 108 (90 Base) MCG/ACT inhaler INHALE 2 PUFFS BY MOUTH EVERY 4 HOURS AS NEEDED FOR SHORTNESS OF BREATH 04/24/19   [name], PA-C  allopurinol (ZYLOPRIM) 100 MG tablet TAKE 1 TABLET BY MOUTH ONCE DAILY. 06/16/19   [name], PA-C  aspirin EC 81 MG EC tablet Take 1 tablet (81 mg total) by mouth daily. 09/19/16   Strader, Fransisco Hertz, PA-C  atorvastatin (LIPITOR) 80 MG tablet TAKE 1 TABLET BY MOUTH ONCE DAILY AT 6PM 08/23/18   [name], PA-C  AURYXIA 1 GM 210 MG(Fe) tablet Take 420 mg by mouth 2 (two) times daily with a meal.  10/10/18   [provider]  carvedilol (COREG) 25 MG tablet TAKE (1) TABLET BY MOUTH TWICE A DAY WITH MEALS (BREAKFAST AND SUPPER) 08/23/18   Burnette, Clearnce Sorrel, PA-C  cyclobenzaprine (FLEXERIL) 5 MG tablet Take 1 tablet (5 mg total) by mouth 3 (three) times daily as needed for muscle spasms. 06/29/19   Blake Divine, MD  docusate sodium (COLACE) 100 MG capsule Take 1 capsule (100 mg total) by mouth 2 (two) times daily as needed for mild constipation. 09/20/17   Gouru, Illene Silver, MD  fluticasone (FLONASE) 50 MCG/ACT nasal spray  Place 2 sprays into both nostrils daily as needed for allergies or rhinitis. 09/28/17   [name], PA-C  hydrALAZINE (APRESOLINE) 100 MG tablet TAKE (1) TABLET BY MOUTH THREE TIMES DAILY 03/16/19   [name], PA-C  hydrOXYzine (ATARAX/VISTARIL) 25 MG tablet Take 1 tablet (25 mg total) by mouth every 6 (six) hours as needed for itching. 12/07/18   Burnette, Clearnce Sorrel, PA-C  LEVEMIR FLEXTOUCH 100 UNIT/ML Pen Inject 60 Units into the skin 2 (two) times a day. 01/19/19   [provider]  lidocaine (LIDODERM) 5 % Place 1 patch onto the skin every 12 (twelve) hours. Remove &  Discard patch within 12 hours or as directed by MD 06/29/19 06/28/20  Blake Divine, MD  lidocaine-prilocaine (EMLA) cream Apply 1 application topically as needed. 03/27/19   Schnier, Dolores Lory, MD  liraglutide (VICTOZA) 18 MG/3ML SOPN Start with 0.11m daily and increase by 0.674mper week until max dose of 1.8 mg dose achieved. 10/20/18   BuMar DaringPA-C  loratadine (CLARITIN) 10 MG tablet TAKE 1 TABLET BY MOUTH ONCE DAILY. 09/20/18   BuMar DaringPA-C  mometasone (ELOCON) 0.1 % cream APPLY AS DIRECTED TO AFFECTED AREA ONCE DAILY. 12/08/18   BuMar DaringPA-C  mometasone-formoterol (DULERA) 200-5 MCG/ACT AERO Inhale 2 puffs into the lungs 2 (two) times daily. 08/03/18   BuMar DaringPA-C  montelukast (SINGULAIR) 10 MG tablet TAKE ONE TABLET BY MOUTH AT BEDTIME. 03/16/19   Burnette, JeClearnce SorrelPA-C  nitroGLYCERIN (NITROSTAT) 0.4 MG SL tablet Place 1 tablet (0.4 mg total) under the tongue every 5 (five) minutes x 3 doses as needed for chest pain. 02/03/19   Bhagat, Bhavinkumar, PA  NOVOLOG FLEXPEN 100 UNIT/ML FlexPen INJECT 25 UNITS S.Q. THREE TIMES A DAY WITH MEALS. 10/20/18   BuFenton Malling, PA-C  omeprazole (PRILOSEC) 20 MG capsule TAKE (1) CAPSULE BY MOUTH ONCE DAILY. 09/20/18   BuMar DaringPA-C  pregabalin (LYRICA) 75 MG capsule TAKE (1) CAPSULE BY MOUTH TWICE  DAILY. 06/16/19   BuMar DaringPA-C  SENNA PLUS 8.6-50 MG tablet TAKE ONE TABLET BY MOUTH AT BEDTIME. 09/20/18   BuMar DaringPA-C  silver sulfADIAZINE (SILVADENE) 1 % cream Apply 1 application topically daily. Apply to right first toe 02/21/19   BrKris HartmannNP  torsemide (DEMADEX) 20 MG tablet TAKE (2) TABLETS BY MOUTH TWICE DAILY. 04/20/19   BuMar DaringPA-C  traMADol (ULTRAM) 50 MG tablet TAKE (1) OR (2) TABLETS EVERY SIX HOURS AS NEEDED FOR PAIN. -MAY MAKEDROWSY- 04/12/19   BuMar DaringPA-C  triamcinolone cream (KENALOG) 0.1 % APPLY TO AFFECTED AREA TWICE DAILY 06/17/18   Burnette, JeClearnce SorrelPA-C  ULTICARE SHORT PEN NEEDLES 31G X 8 MM MISC USE THREE TIMES DAILY WITH INSULIN 03/22/19   BuMar DaringPA-C  valACYclovir (VALTREX) 500 MG tablet TAKE (1) TABLET BY MOUTH EVERY OTHER DAY. 11/18/18   BuMar DaringPA-C  Vitamin D, Ergocalciferol, (DRISDOL) 1.25 MG (50000 UT) CAPS capsule TAKE 1 CAPSULE BY MOUTH ONCE A MONTH 06/16/19   BuMar DaringPA-C  gabapentin (NEURONTIN) 100 MG capsule Take 1 capsule (100 mg total) by mouth 3 (three) times daily. 08/19/17 02/03/19  PaFritzi MandesMD    Allergies Shellfish allergy and Diazepam  Family History  Problem Relation Age of Onset  . Colon cancer Mother   . Heart attack Other   . Heart attack Maternal Grandmother   . Hypertension Sister   . Hypertension Brother   . Diabetes Paternal Grandmother   . Breast cancer Neg Hx     Social History Social History   Tobacco Use  . Smoking status: Former Smoker    Packs/day: 0.25    Years: 6.00    Pack years: 1.50    Types: Cigarettes    Quit date: 10/25/1980    Years since quitting: 38.7  . Smokeless tobacco: Never Used  Substance Use Topics  . Alcohol use: Yes    Comment: rarely  . Drug use: Yes    Types: Marijuana    Comment: Occasional marijuana use 08/21/2016 "no crack-clean  since 05/1998"    Review of Systems  Constitutional: No  fever/chills Eyes: No visual changes. ENT: No sore throat. Cardiovascular: Denies chest pain. Respiratory: Denies shortness of breath. Gastrointestinal: Positive for abdominal pain.  No nausea, no vomiting.  No diarrhea.  No constipation. Genitourinary: Negative for dysuria. Musculoskeletal: Positive for back pain. Skin: Negative for rash. Neurological: Negative for headaches, focal weakness or numbness.  ____________________________________________   PHYSICAL EXAM:  VITAL SIGNS: ED Triage Vitals  Enc Vitals Group     BP 06/29/19 1154 137/63     Pulse Rate 06/29/19 1154 87     Resp 06/29/19 1154 20     Temp 06/29/19 1154 98.8 F (37.1 C)     Temp Source 06/29/19 1154 Oral     SpO2 06/29/19 1154 98 %     Weight 06/29/19 1152 286 lb (129.7 kg)     Height 06/29/19 1152 _0  (1.702 m)     Head Circumference --      Peak Flow --      Pain Score 06/29/19 1152 10     Pain Loc --      Pain Edu? --      Excl. in [place]? --     Constitutional: Alert and oriented. Eyes: Conjunctivae are normal. Head: Atraumatic. Nose: No congestion/rhinnorhea. Mouth/Throat: Mucous membranes are moist. Neck: Normal ROM Cardiovascular: Normal rate, regular rhythm. Grossly normal heart sounds. Respiratory: Normal respiratory effort.  No retractions. Lungs CTAB. Gastrointestinal: Soft and tender to palpation in the right lower quadrant with no rebound or guarding. No distention. Genitourinary: deferred Musculoskeletal: No lower extremity tenderness nor edema.  Tenderness to palpation over midline lumbar spine as well as right paraspinal lumbar region.  Negative straight leg raise bilaterally.  2+ DP pulses bilaterally. Neurologic:  Normal speech and language. No gross focal neurologic deficits are appreciated. Skin:  Skin is warm, dry and intact. No rash noted. Psychiatric: Mood and affect are normal. Speech and behavior are normal.  ____________________________________________   LABS (all labs  ordered are listed, but only abnormal results are displayed)  Labs Reviewed  COMPREHENSIVE METABOLIC PANEL - Abnormal; Notable for the following components:      Result Value   Sodium 133 (*)    Chloride 92 (*)    Glucose, Bld 302 (*)    BUN 27 (*)    Creatinine, Ser 4.83 (*)    Calcium 8.5 (*)    Alkaline Phosphatase 281 (*)    GFR calc non Af Amer 9 (*)    GFR calc Af Amer 10 (*)    All other components within normal limits  CBC - Abnormal; Notable for the following components:   RBC 3.55 (*)    Hemoglobin 11.2 (*)    HCT 34.9 (*)    All other components within normal limits  LIPASE, BLOOD   ____________________________________________  EKG  ED ECG REPORT I, Blake Divine, the attending physician, personally viewed and interpreted this ECG.   Date: 06/29/2019  EKG Time: 12:05  Rate: 89  Rhythm: normal sinus rhythm  Axis: Normal  Intervals:Prolonged QT  ST&T Change: None   PROCEDURES  Procedure(s) performed (including Critical Care):  Procedures   ____________________________________________   INITIAL IMPRESSION / ASSESSMENT AND PLAN / ED COURSE       63 year old female with history of ESRD on HD as well as CAD, diabetes, CHF presents to the ED with 1 month of right lower quadrant as well as right low back pain, acutely worse over  the past 2 days.  She does not describe any recent trauma to the area, but does not have any concerning findings for cauda equina.  She does have some tenderness in her right lower quadrant, will perform CT to assess for appendicitis versus nephrolithiasis versus other intra-abdominal pathology.  Will attempt to obtain urine sample, although she does not make urine frequently and denies any urinary symptoms, lower suspicion for pyelonephritis or UTI.  Lab work is reassuring outside of hyperglycemia, there is no evidence of DKA.  Would hold off on significant fluid administration in this dialysis patient.  CTs are negative for acute  process, at this point I suspect patient symptoms are secondary to lumbar spinal pathology.  No signs or symptoms to suggest cauda equina as she is ambulatory with no difficulty urinating or saddle anesthesia.  She unfortunately was not able to provide urine sample, but I do doubt UTI given lack of CVA tenderness or dysuria.  We will treat conservatively with Lidoderm patches and muscle relaxants, counseled patient to follow-up with her PCP and to return to the ED for new or worsening symptoms.  Patient agrees with plan.      ____________________________________________   FINAL CLINICAL IMPRESSION(S) / ED DIAGNOSES  Final diagnoses:  Acute right-sided low back pain without sciatica  Right lower quadrant abdominal pain  ESRD on dialysis [facility])     ED Discharge Orders         Ordered    lidocaine (LIDODERM) 5 %  Every 12 hours     06/29/19 1647    cyclobenzaprine (FLEXERIL) 5 MG tablet  3 times daily PRN     06/29/19 1647           Note:  This document was prepared using Dragon voice recognition software and may include unintentional dictation errors.   Blake Divine, MD 06/29/19 2059

## 2019-06-29 NOTE — ED Notes (Signed)
Blood collected by paramedic student

## 2019-06-29 NOTE — ED Triage Notes (Signed)
Pt reports pain to her RLQ for the past 2 weeks that has gotten worse and now it hurts her right leg to walk on and hurts when you push it.

## 2019-07-05 IMAGING — NM NM PULMONARY VENT & PERF
14 series · 14 of 14 positions shown · non-contrast
Comparison: None.

CLINICAL DATA: Chest pain and shortness of breath.

EXAM:
NUCLEAR MEDICINE VENTILATION - PERFUSION LUNG SCAN
TECHNIQUE: Ventilation images were obtained in multiple projections using
inhaled aerosol Bc-22m DTPA. Perfusion images were obtained in
multiple projections after intravenous injection of Bc-22m MAA.
RADIOPHARMACEUTICALS:  31 mCi Oechnetium-NNm DTPA aerosol inhalation
and 4.2 mCi Oechnetium-NNm MAA IV

[Series 1: ant/post vent · 4.14mm/px · 1 of 1 slices shown (1 of 2)]
[im 1/1]
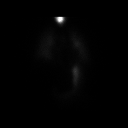

[Series 1: ant/post vent · 4.14mm/px · 1 of 1 slices shown (2 of 2)]
[im 1/1]
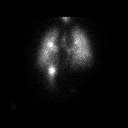

[Series 2: lao/rpo vent · 4.14mm/px · 1 of 1 slices shown (1 of 2)]
[im 1/1]
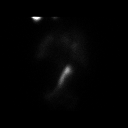

[Series 2: lao/rpo vent · 4.14mm/px · 1 of 1 slices shown (2 of 2)]
[im 1/1]
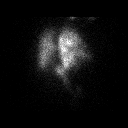

[Series 3: lpo/rao vent · 4.14mm/px · 1 of 1 slices shown (1 of 2)]
[im 1/1]
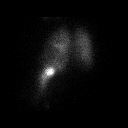

[Series 3: lpo/rao vent · 4.14mm/px · 1 of 1 slices shown (2 of 2)]
[im 1/1]
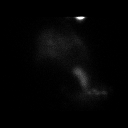

[Series 4: lt lat/rt lat perf · 4.14mm/px · 1 of 1 slices shown (1 of 2)]
[im 1/1  full-range]
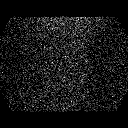

[Series 4: lt lat/rt lat perf · 4.14mm/px · 1 of 1 slices shown (2 of 2)]
[im 1/1]
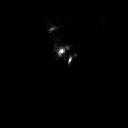

[Series 5: lpo/rao perf · 4.14mm/px · 1 of 1 slices shown (1 of 2)]
[im 1/1]
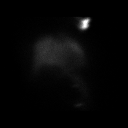

[Series 5: lpo/rao perf · 4.14mm/px · 1 of 1 slices shown (2 of 2)]
[im 1/1]
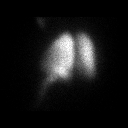

[Series 6: ant/post perf · 4.14mm/px · 1 of 1 slices shown (1 of 2)]
[im 1/1]
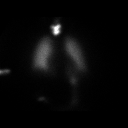

[Series 6: ant/post perf · 4.14mm/px · 1 of 1 slices shown (2 of 2)]
[im 1/1]
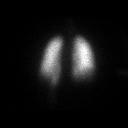

[Series 7: lao/rpo perf · 4.14mm/px · 1 of 1 slices shown (1 of 2)]
[im 1/1]
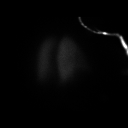

[Series 7: lao/rpo perf · 4.14mm/px · 1 of 1 slices shown (2 of 2)]
[im 1/1]
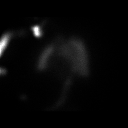

[14 of 14 positions shown; findings below may reference images not displayed]

FINDINGS: Ventilation: No focal ventilation defect.

Perfusion: No wedge shaped peripheral perfusion defects to suggest
acute pulmonary embolism.
IMPRESSION: Normal ventilation-perfusion lung scan. No evidence of pulmonary
embolism.

## 2019-07-05 IMAGING — CR DG CHEST 2V
2 series · 2 of 2 positions shown · non-contrast
Comparison: 12/12/2016

CLINICAL DATA: Shortness of breath over the last week. Progressive
worsening. Swelling of the lower extremities.

EXAM:
CHEST  2 VIEW

[chest pa]
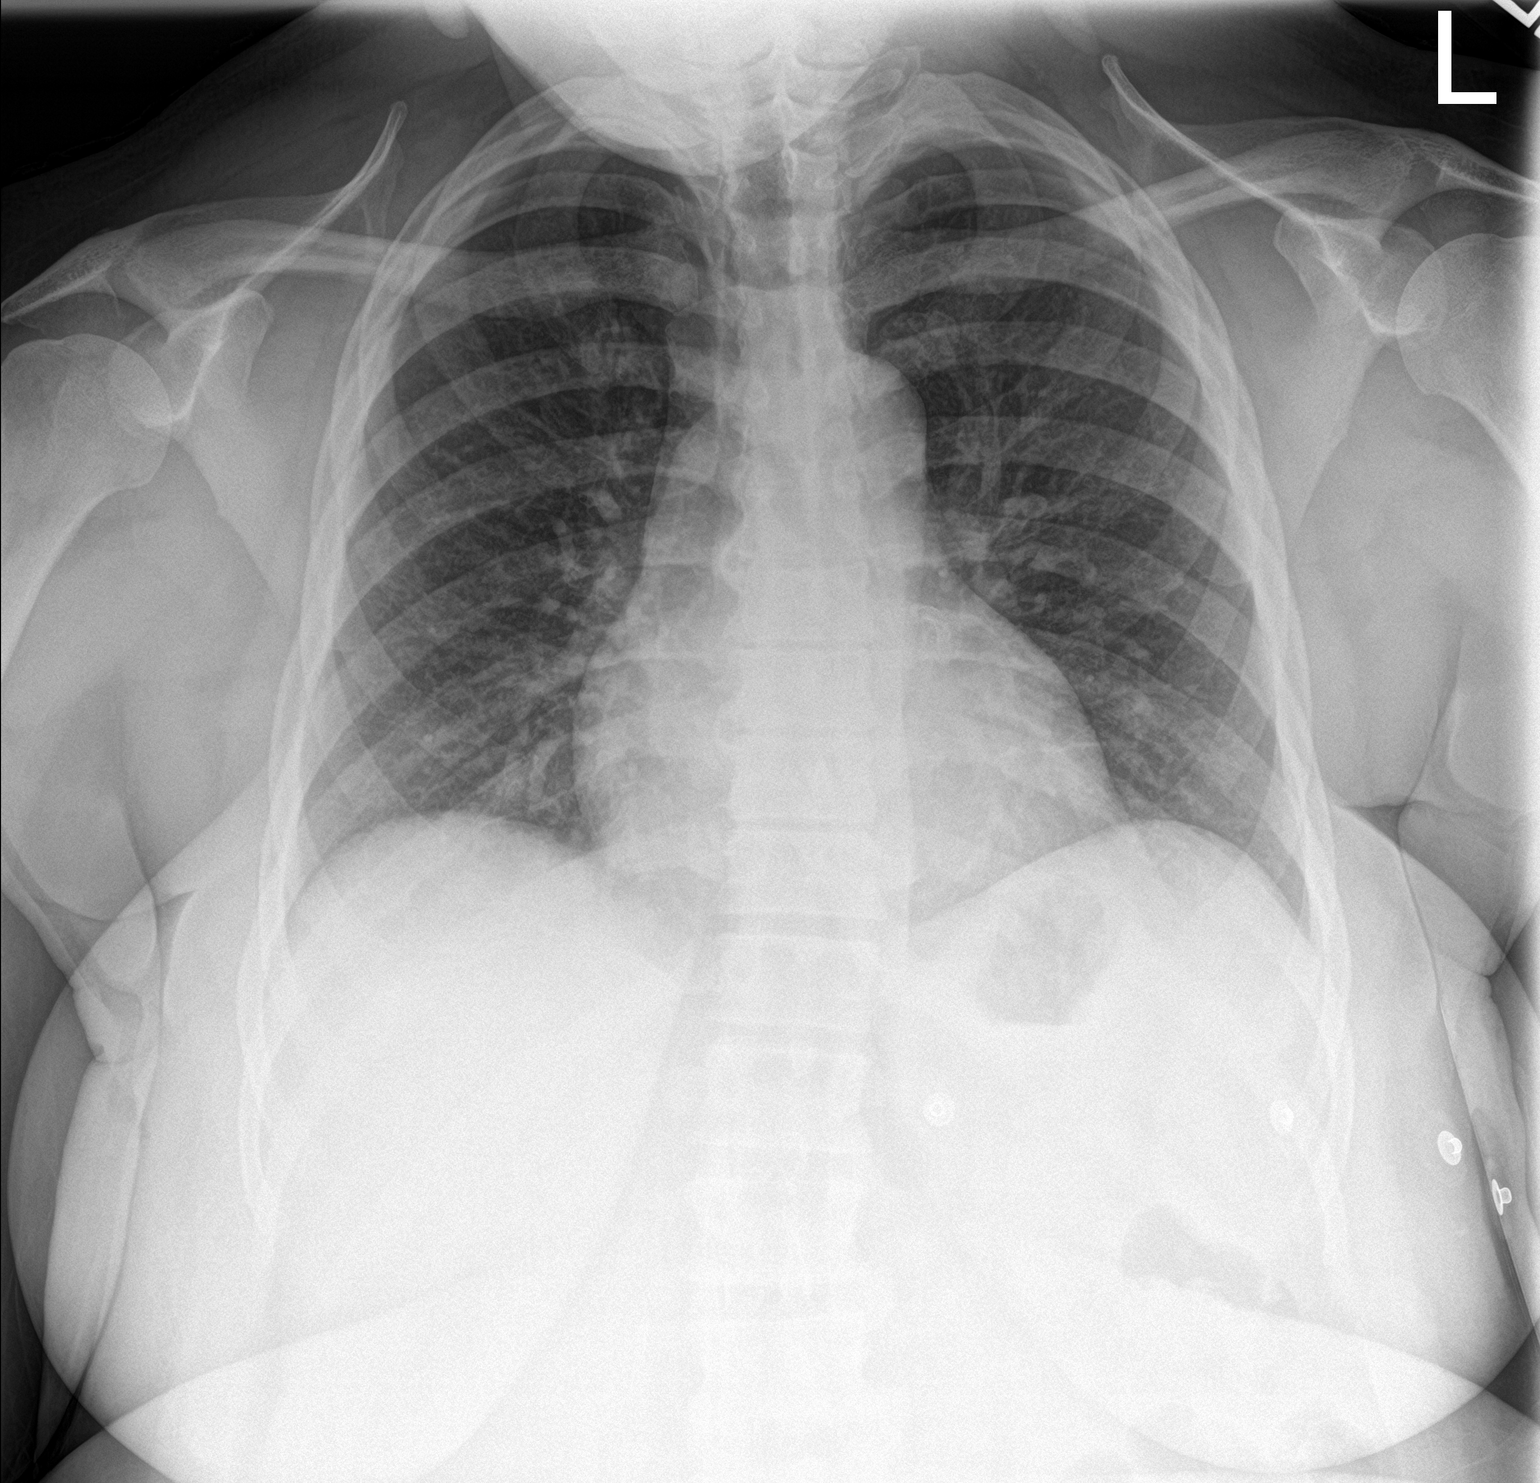

[chest lat]
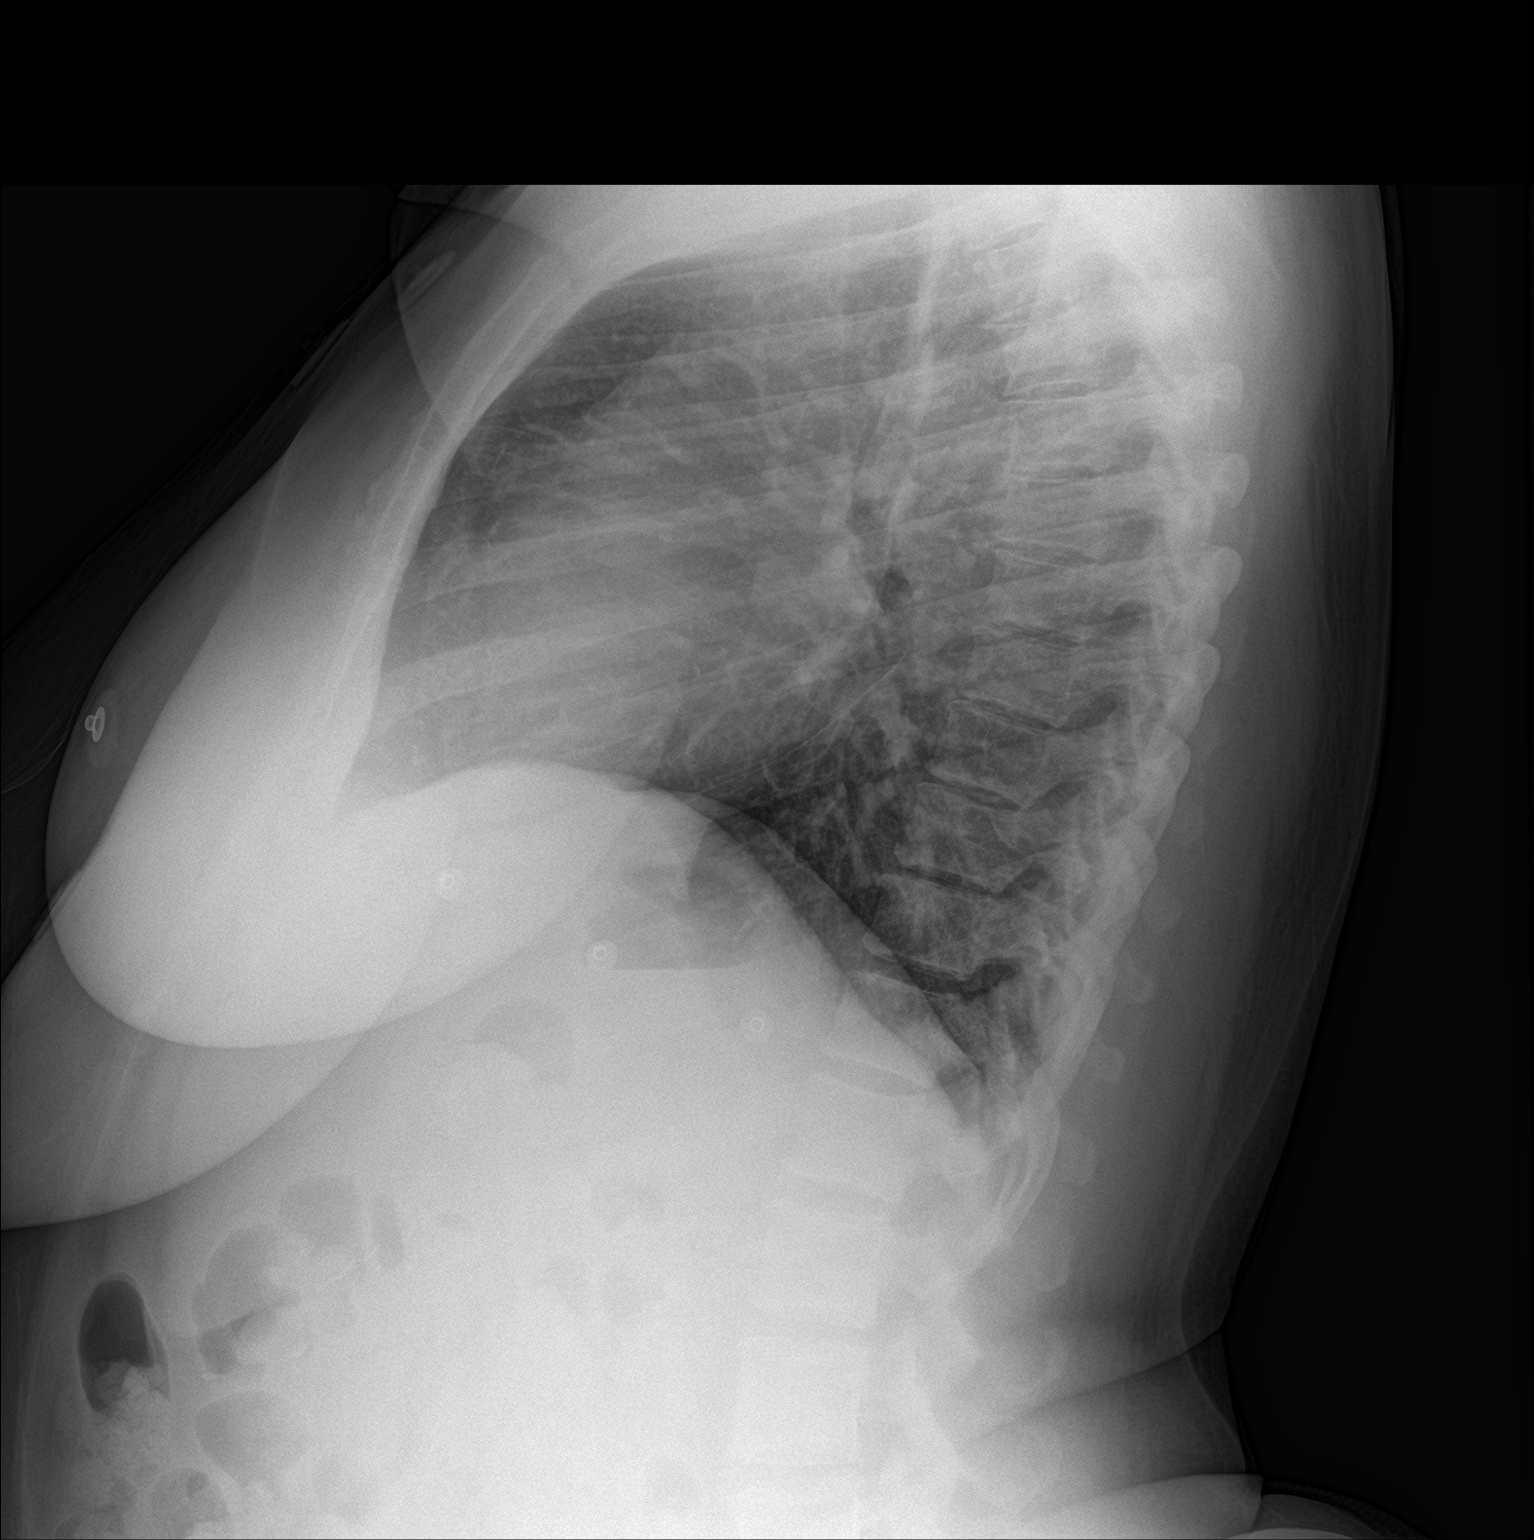

[2 of 2 positions shown; findings below may reference images not displayed]

FINDINGS: Heart size is normal. Mediastinal shadows are normal. The
vascularity is normal. No effusions. Lungs are clear. Ordinary mild
spinal degenerative changes.
IMPRESSION: No active disease.

## 2019-07-14 DIAGNOSIS — E1122 Type 2 diabetes mellitus with diabetic chronic kidney disease: Secondary | ICD-10-CM | POA: Diagnosis not present

## 2019-07-14 DIAGNOSIS — N186 End stage renal disease: Secondary | ICD-10-CM | POA: Diagnosis not present

## 2019-07-14 DIAGNOSIS — Z992 Dependence on renal dialysis: Secondary | ICD-10-CM | POA: Diagnosis not present

## 2019-07-16 DIAGNOSIS — D509 Iron deficiency anemia, unspecified: Secondary | ICD-10-CM | POA: Diagnosis not present

## 2019-07-16 DIAGNOSIS — N2581 Secondary hyperparathyroidism of renal origin: Secondary | ICD-10-CM | POA: Diagnosis not present

## 2019-07-16 DIAGNOSIS — D631 Anemia in chronic kidney disease: Secondary | ICD-10-CM | POA: Diagnosis not present

## 2019-07-16 DIAGNOSIS — E1129 Type 2 diabetes mellitus with other diabetic kidney complication: Secondary | ICD-10-CM | POA: Diagnosis not present

## 2019-07-16 DIAGNOSIS — N186 End stage renal disease: Secondary | ICD-10-CM | POA: Diagnosis not present

## 2019-07-16 DIAGNOSIS — Z992 Dependence on renal dialysis: Secondary | ICD-10-CM | POA: Diagnosis not present

## 2019-07-18 ENCOUNTER — Other Ambulatory Visit: Payer: Self-pay | Admitting: Physician Assistant

## 2019-07-18 DIAGNOSIS — IMO0002 Reserved for concepts with insufficient information to code with codable children: Secondary | ICD-10-CM

## 2019-07-18 DIAGNOSIS — E1165 Type 2 diabetes mellitus with hyperglycemia: Secondary | ICD-10-CM

## 2019-07-18 DIAGNOSIS — M62830 Muscle spasm of back: Secondary | ICD-10-CM

## 2019-07-18 MED ORDER — TRAMADOL HCL 50 MG PO TABS: ORAL_TABLET | ORAL | 0 refills | Status: DC

## 2019-07-18 NOTE — Telephone Encounter (Signed)
[name] faxed refill request for the following medications:  traMADol (ULTRAM) 50 MG tablet   Please advise.

## 2019-07-19 DIAGNOSIS — D631 Anemia in chronic kidney disease: Secondary | ICD-10-CM | POA: Diagnosis not present

## 2019-07-19 DIAGNOSIS — D509 Iron deficiency anemia, unspecified: Secondary | ICD-10-CM | POA: Diagnosis not present

## 2019-07-19 DIAGNOSIS — N186 End stage renal disease: Secondary | ICD-10-CM | POA: Diagnosis not present

## 2019-07-19 DIAGNOSIS — E1129 Type 2 diabetes mellitus with other diabetic kidney complication: Secondary | ICD-10-CM | POA: Diagnosis not present

## 2019-07-19 DIAGNOSIS — N2581 Secondary hyperparathyroidism of renal origin: Secondary | ICD-10-CM | POA: Diagnosis not present

## 2019-07-19 DIAGNOSIS — Z992 Dependence on renal dialysis: Secondary | ICD-10-CM | POA: Diagnosis not present

## 2019-07-19 IMAGING — DX DG FOOT COMPLETE 3+V*L*
3 series · 3 of 3 positions shown · non-contrast
Comparison: None.

CLINICAL DATA: Pain over the Achilles. Unable to bear weight. No
injury.

EXAM:
LEFT FOOT - COMPLETE 3+ VIEW

[foot ap]
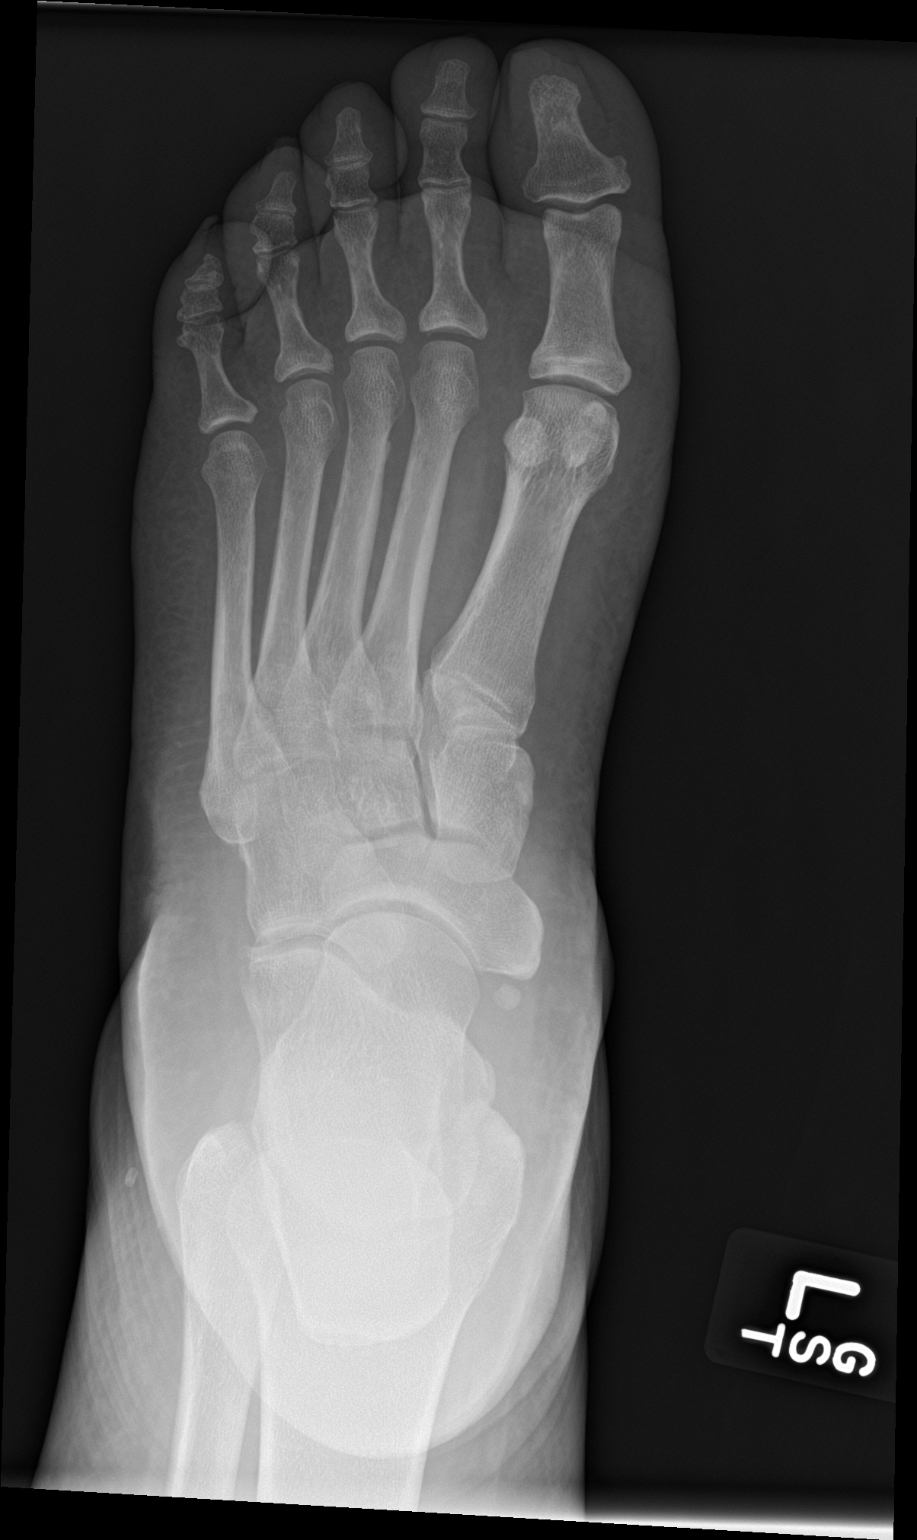

[foot obl]
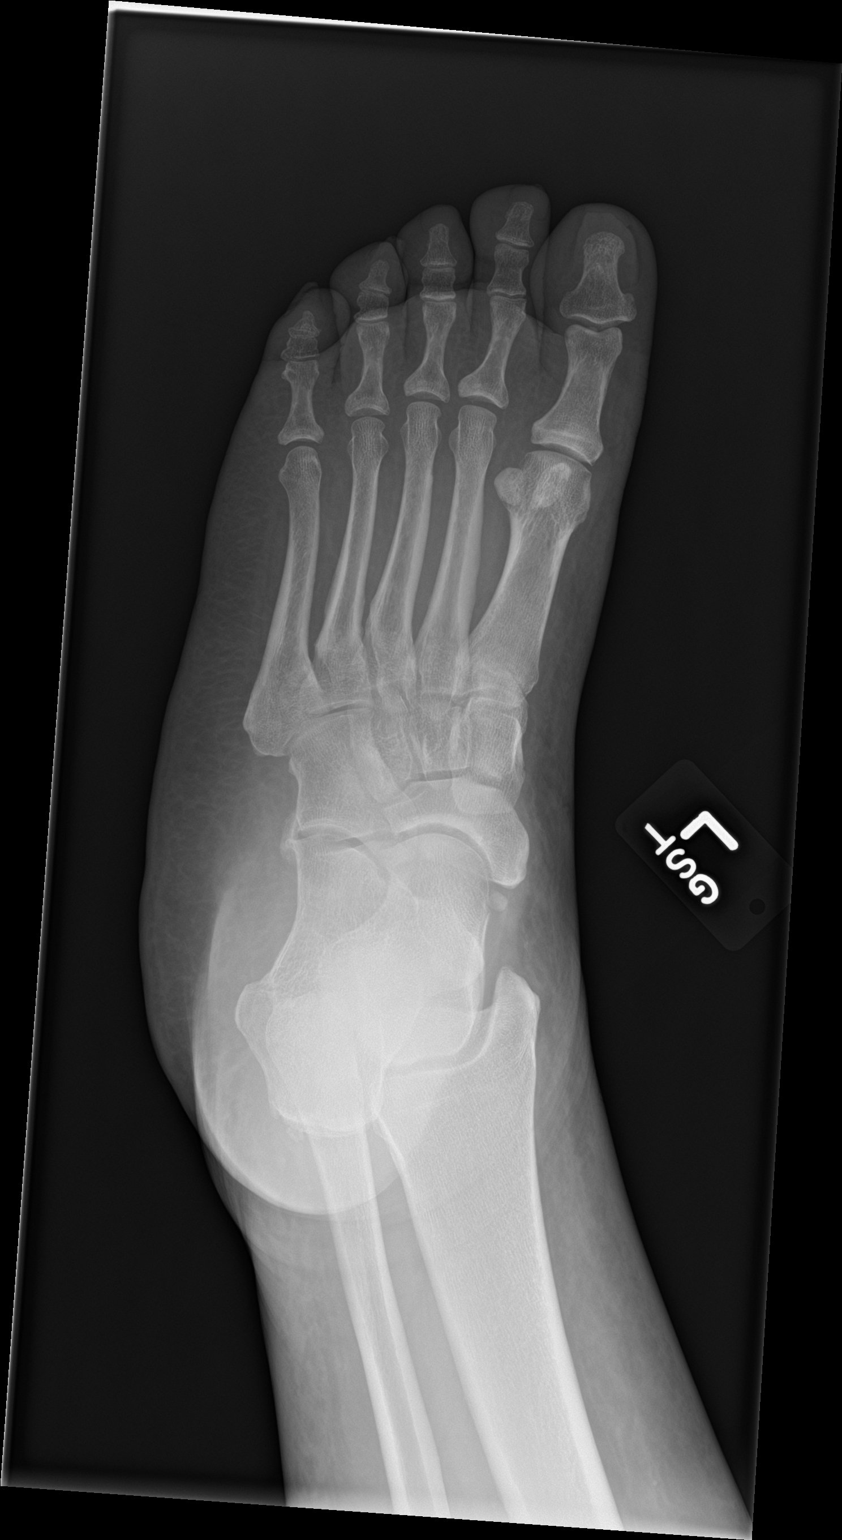

[foot lat]
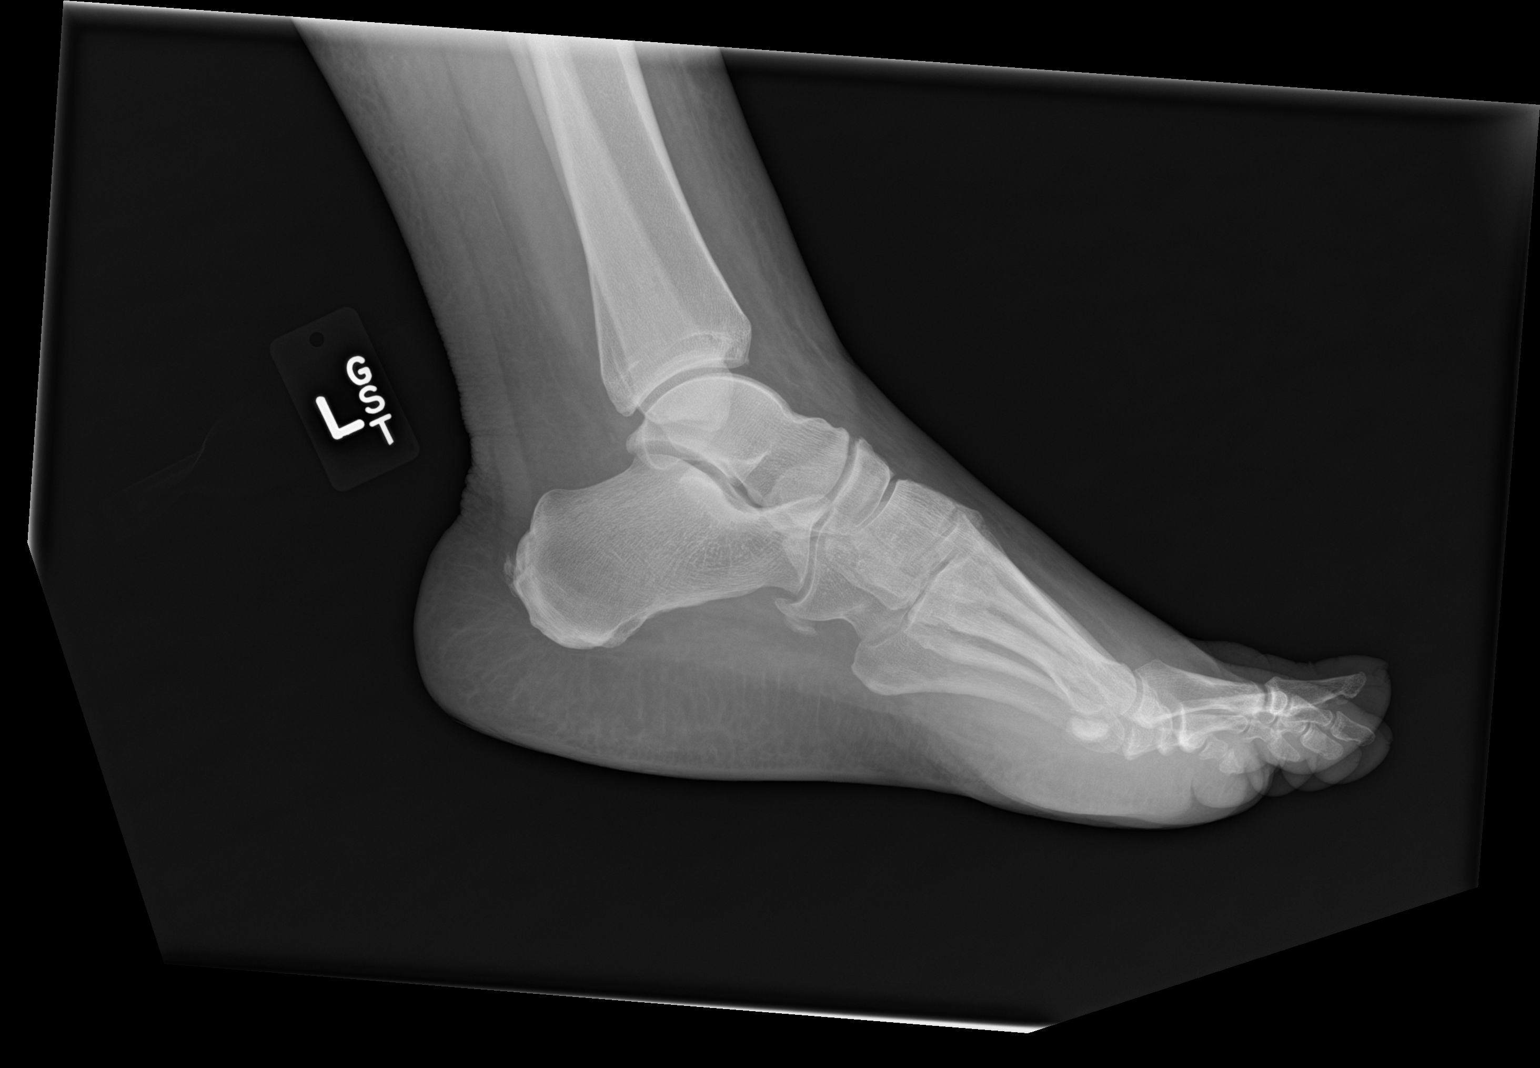

[3 of 3 positions shown; findings below may reference images not displayed]

FINDINGS: Small Achilles calcaneal spur. Old ununited ossicle adjacent to the
navicular bone. Osteophyte versus ununited ossicle adjacent to the
cuboidal bone. No acute displaced fractures identified. No focal
bone lesion or bone destruction. Soft tissues are unremarkable.
IMPRESSION: No acute bony abnormalities.  Small Achilles calcaneal spur.

## 2019-07-20 DIAGNOSIS — D631 Anemia in chronic kidney disease: Secondary | ICD-10-CM | POA: Diagnosis not present

## 2019-07-20 DIAGNOSIS — E1129 Type 2 diabetes mellitus with other diabetic kidney complication: Secondary | ICD-10-CM | POA: Diagnosis not present

## 2019-07-20 DIAGNOSIS — N2581 Secondary hyperparathyroidism of renal origin: Secondary | ICD-10-CM | POA: Diagnosis not present

## 2019-07-20 DIAGNOSIS — D509 Iron deficiency anemia, unspecified: Secondary | ICD-10-CM | POA: Diagnosis not present

## 2019-07-20 DIAGNOSIS — Z992 Dependence on renal dialysis: Secondary | ICD-10-CM | POA: Diagnosis not present

## 2019-07-20 DIAGNOSIS — N186 End stage renal disease: Secondary | ICD-10-CM | POA: Diagnosis not present

## 2019-07-22 DIAGNOSIS — D631 Anemia in chronic kidney disease: Secondary | ICD-10-CM | POA: Diagnosis not present

## 2019-07-22 DIAGNOSIS — N2581 Secondary hyperparathyroidism of renal origin: Secondary | ICD-10-CM | POA: Diagnosis not present

## 2019-07-22 DIAGNOSIS — D509 Iron deficiency anemia, unspecified: Secondary | ICD-10-CM | POA: Diagnosis not present

## 2019-07-22 DIAGNOSIS — Z992 Dependence on renal dialysis: Secondary | ICD-10-CM | POA: Diagnosis not present

## 2019-07-22 DIAGNOSIS — N186 End stage renal disease: Secondary | ICD-10-CM | POA: Diagnosis not present

## 2019-07-22 DIAGNOSIS — E1129 Type 2 diabetes mellitus with other diabetic kidney complication: Secondary | ICD-10-CM | POA: Diagnosis not present

## 2019-07-25 ENCOUNTER — Ambulatory Visit: Payer: Self-pay

## 2019-07-25 NOTE — Telephone Encounter (Signed)
Incoming call from Patient, with complaint of bilateral feet having a sensation of numbness for 2 months.  Big toes are sore Bilateral.  States that sensation is constant.  Reports that vision is off at times.  Would like to be  Tested for Covid-19.  Provided website address to make appointment.  Voices understanding.  Reviewed protocol with Patient.  Recommended Patient go to Urgent Care for evaluation of bilateral feet.  Patient voices understanding.  States she will go.        Reason for Disposition . [1] Weakness (i.e., paralysis, loss of muscle strength) of the face, arm / hand, or leg / foot on one side of the body AND [2] sudden onset AND [3] brief (now gone)  Answer Assessment - Initial Assessment Questions 1. SYMPTOM: "What is the main symptom you are concerned about?" (e.g., weakness, numbness)  both of feet are numb big toe x's two are sore 2. ONSET: "When did this start?" (minutes, hours, days; while sleeping)    2 months 3. LAST NORMAL: "When was the last time you were normal (no symptoms)?"      4. PATTERN "Does this come and go, or has it been constant since it started?"  "Is it present now?"     Constant especially when wearing shoes 5. CARDIAC SYMPTOMS: "Have you had any of the following symptoms: chest pain, difficulty breathing, palpitations?"     denies 6. NEUROLOGIC SYMPTOMS: "Have you had any of the following symptoms: headache, dizziness, vision loss, double vision, changes in speech, unsteady on your feet?"     Vision kind of off 7. OTHER SYMPTOMS: "Do you have any other symptoms?"     Want to be tested for covdi-19 8. PREGNANCY: "Is there any chance you are pregnant?" "When was your last menstrual period?"     **na  Protocols used: NEUROLOGIC DEFICIT-A-AH

## 2019-07-27 ENCOUNTER — Encounter: Payer: Medicare Other | Admitting: Physician Assistant

## 2019-07-27 ENCOUNTER — Ambulatory Visit: Payer: Medicare Other | Attending: Internal Medicine

## 2019-07-27 DIAGNOSIS — Z992 Dependence on renal dialysis: Secondary | ICD-10-CM | POA: Diagnosis not present

## 2019-07-27 DIAGNOSIS — D509 Iron deficiency anemia, unspecified: Secondary | ICD-10-CM | POA: Diagnosis not present

## 2019-07-27 DIAGNOSIS — Z20822 Contact with and (suspected) exposure to covid-19: Secondary | ICD-10-CM

## 2019-07-27 DIAGNOSIS — N2581 Secondary hyperparathyroidism of renal origin: Secondary | ICD-10-CM | POA: Diagnosis not present

## 2019-07-27 DIAGNOSIS — E1129 Type 2 diabetes mellitus with other diabetic kidney complication: Secondary | ICD-10-CM | POA: Diagnosis not present

## 2019-07-27 DIAGNOSIS — D631 Anemia in chronic kidney disease: Secondary | ICD-10-CM | POA: Diagnosis not present

## 2019-07-27 DIAGNOSIS — N186 End stage renal disease: Secondary | ICD-10-CM | POA: Diagnosis not present

## 2019-07-27 NOTE — Progress Notes (Signed)
Patient NOS appt

## 2019-07-29 DIAGNOSIS — D509 Iron deficiency anemia, unspecified: Secondary | ICD-10-CM | POA: Diagnosis not present

## 2019-07-29 DIAGNOSIS — E1129 Type 2 diabetes mellitus with other diabetic kidney complication: Secondary | ICD-10-CM | POA: Diagnosis not present

## 2019-07-29 DIAGNOSIS — Z992 Dependence on renal dialysis: Secondary | ICD-10-CM | POA: Diagnosis not present

## 2019-07-29 DIAGNOSIS — N2581 Secondary hyperparathyroidism of renal origin: Secondary | ICD-10-CM | POA: Diagnosis not present

## 2019-07-29 DIAGNOSIS — D631 Anemia in chronic kidney disease: Secondary | ICD-10-CM | POA: Diagnosis not present

## 2019-07-29 DIAGNOSIS — N186 End stage renal disease: Secondary | ICD-10-CM | POA: Diagnosis not present

## 2019-07-29 LAB — NOVEL CORONAVIRUS, NAA: SARS-CoV-2, NAA: NOT DETECTED

## 2019-07-30 IMAGING — DX DG FOOT COMPLETE 3+V*R*
3 series · 3 of 3 positions shown · non-contrast
Comparison: 04/01/2016

CLINICAL DATA: Painful right foot.  Diabetes.  Pain and swelling.

EXAM:
RIGHT FOOT COMPLETE - 3+ VIEW

[x foot ap right]
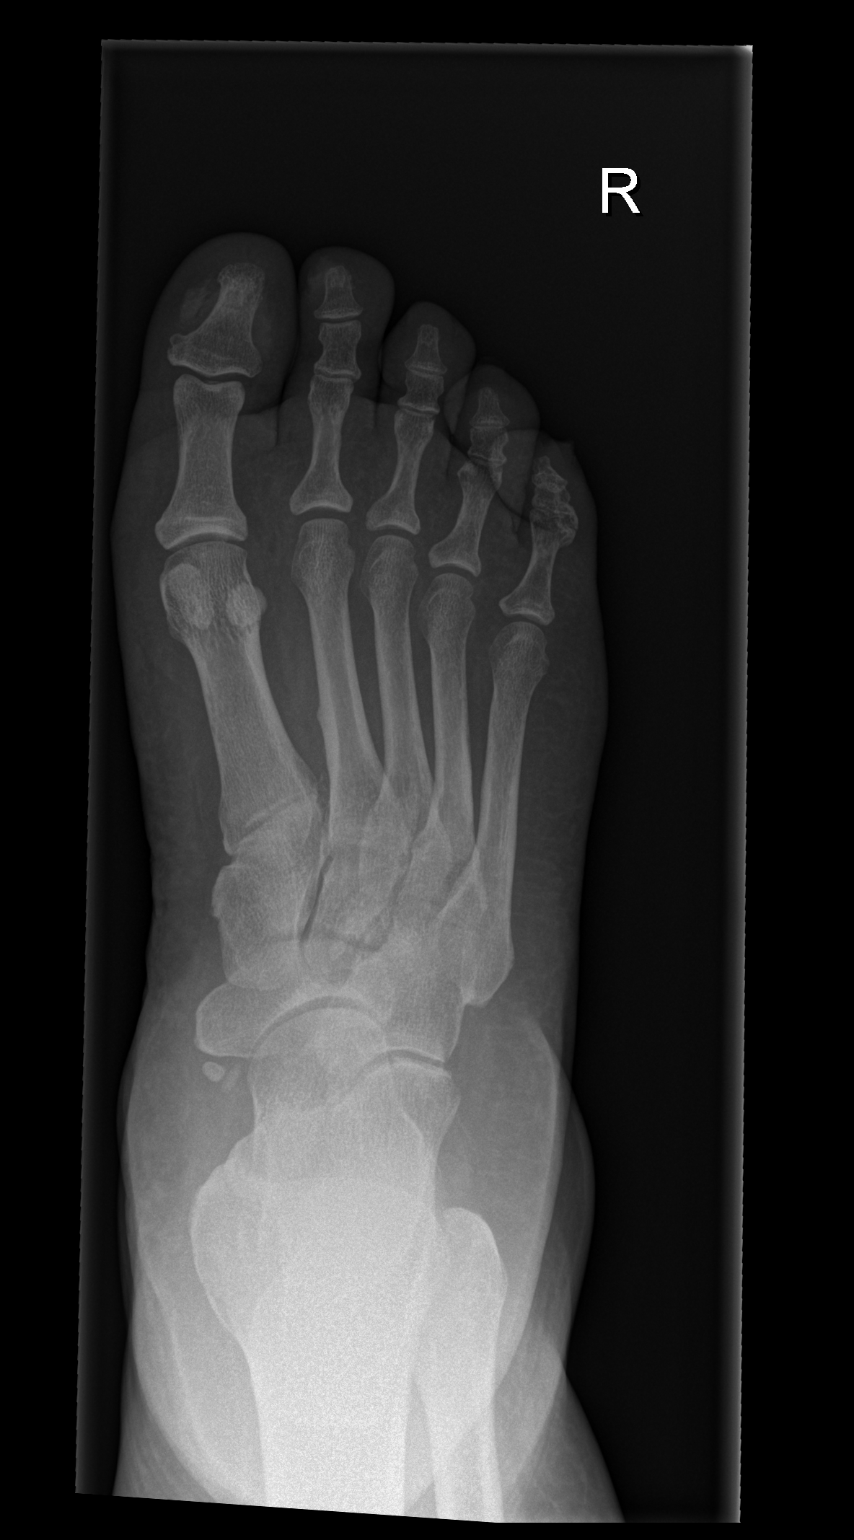

[x foot obl right]
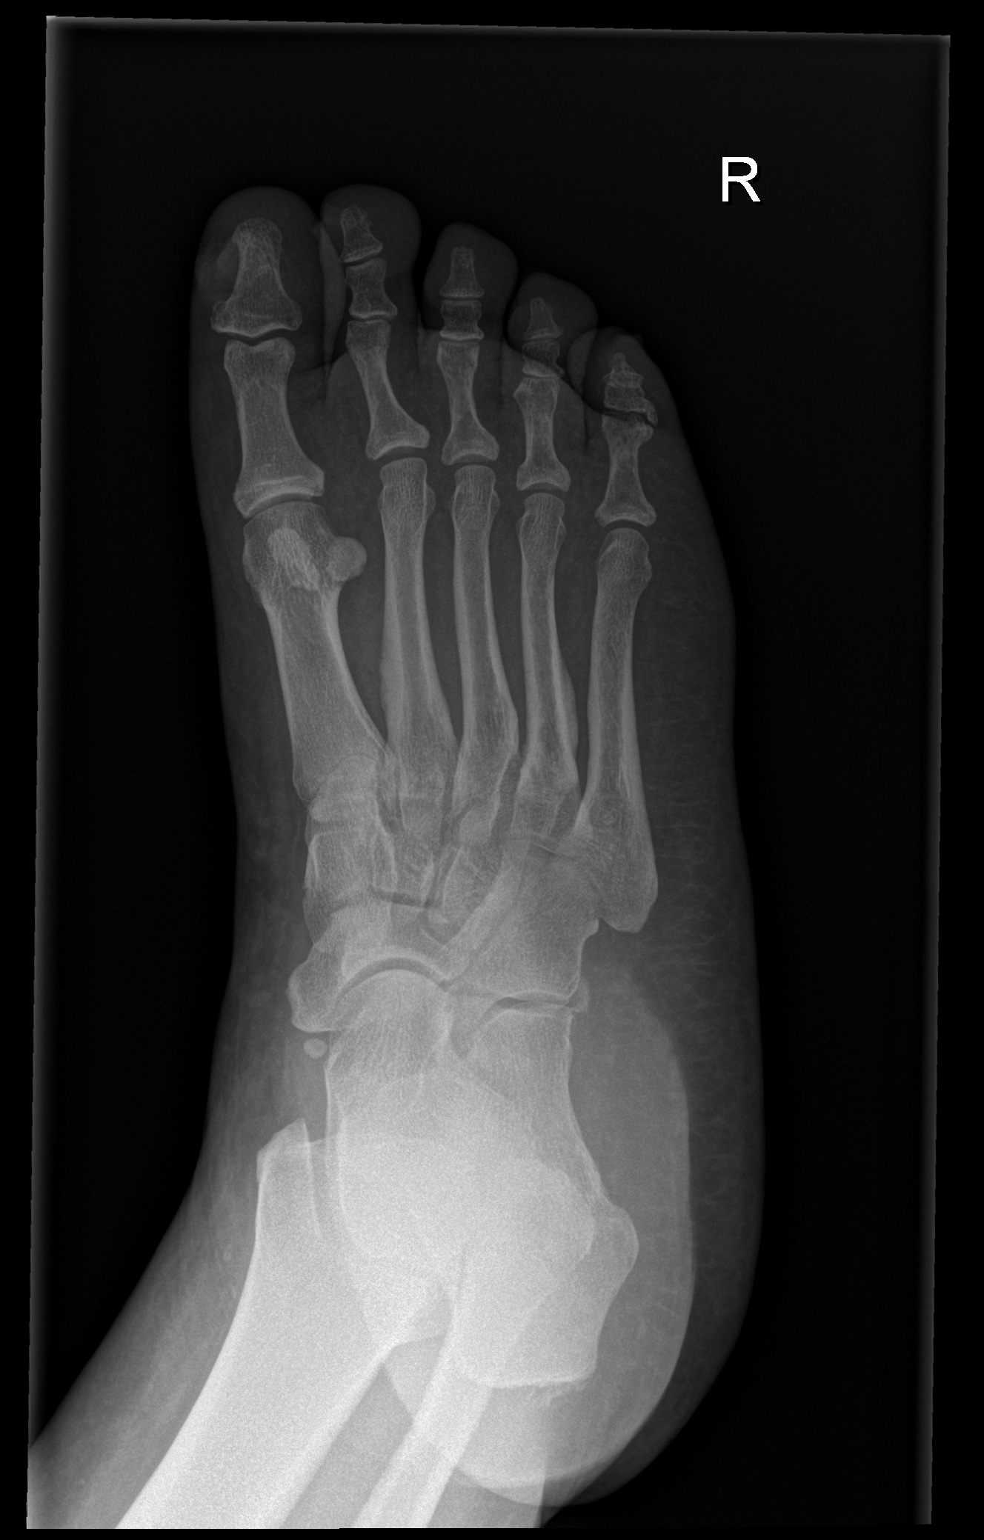

[w foot lat right]
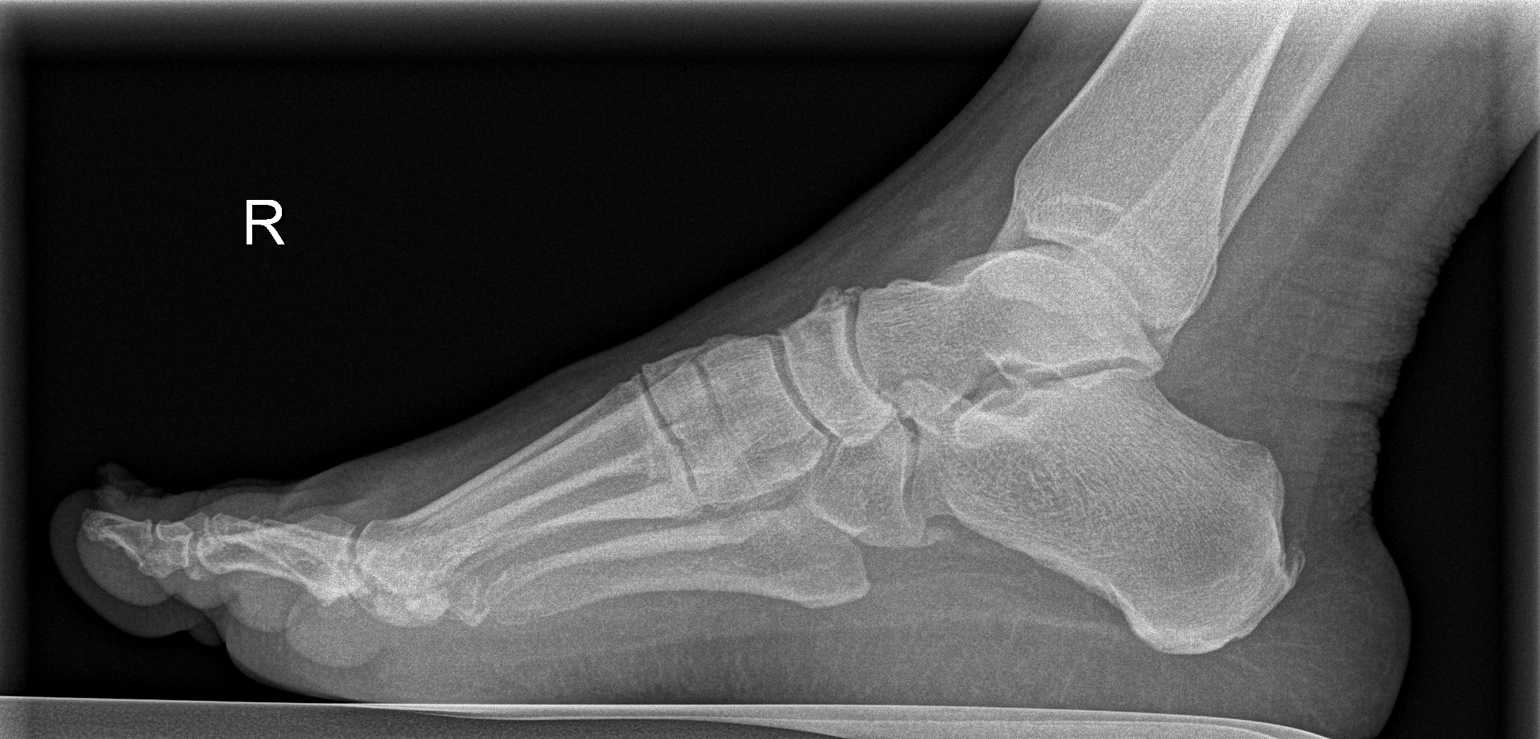

[3 of 3 positions shown; findings below may reference images not displayed]

FINDINGS: Mild chronic change at the fifth PIP joint. Mild degenerative change
over the midfoot. No acute fracture or dislocation. No air within
the soft tissues.
IMPRESSION: No acute findings.

## 2019-08-01 DIAGNOSIS — D509 Iron deficiency anemia, unspecified: Secondary | ICD-10-CM | POA: Diagnosis not present

## 2019-08-01 DIAGNOSIS — D631 Anemia in chronic kidney disease: Secondary | ICD-10-CM | POA: Diagnosis not present

## 2019-08-01 DIAGNOSIS — Z992 Dependence on renal dialysis: Secondary | ICD-10-CM | POA: Diagnosis not present

## 2019-08-01 DIAGNOSIS — E1129 Type 2 diabetes mellitus with other diabetic kidney complication: Secondary | ICD-10-CM | POA: Diagnosis not present

## 2019-08-01 DIAGNOSIS — N186 End stage renal disease: Secondary | ICD-10-CM | POA: Diagnosis not present

## 2019-08-01 DIAGNOSIS — N2581 Secondary hyperparathyroidism of renal origin: Secondary | ICD-10-CM | POA: Diagnosis not present

## 2019-08-03 DIAGNOSIS — N186 End stage renal disease: Secondary | ICD-10-CM | POA: Diagnosis not present

## 2019-08-03 DIAGNOSIS — E1129 Type 2 diabetes mellitus with other diabetic kidney complication: Secondary | ICD-10-CM | POA: Diagnosis not present

## 2019-08-03 DIAGNOSIS — Z992 Dependence on renal dialysis: Secondary | ICD-10-CM | POA: Diagnosis not present

## 2019-08-03 DIAGNOSIS — N2581 Secondary hyperparathyroidism of renal origin: Secondary | ICD-10-CM | POA: Diagnosis not present

## 2019-08-03 DIAGNOSIS — D631 Anemia in chronic kidney disease: Secondary | ICD-10-CM | POA: Diagnosis not present

## 2019-08-03 DIAGNOSIS — D509 Iron deficiency anemia, unspecified: Secondary | ICD-10-CM | POA: Diagnosis not present

## 2019-08-04 ENCOUNTER — Encounter: Payer: Self-pay | Admitting: Physician Assistant

## 2019-08-07 NOTE — Telephone Encounter (Signed)
She can be either. I would prefer if virtual for me to be able to see her, not a telephone. So either video virtual or in office.

## 2019-08-08 DIAGNOSIS — E1129 Type 2 diabetes mellitus with other diabetic kidney complication: Secondary | ICD-10-CM | POA: Diagnosis not present

## 2019-08-08 DIAGNOSIS — Z992 Dependence on renal dialysis: Secondary | ICD-10-CM | POA: Diagnosis not present

## 2019-08-08 DIAGNOSIS — D631 Anemia in chronic kidney disease: Secondary | ICD-10-CM | POA: Diagnosis not present

## 2019-08-08 DIAGNOSIS — N186 End stage renal disease: Secondary | ICD-10-CM | POA: Diagnosis not present

## 2019-08-08 DIAGNOSIS — D509 Iron deficiency anemia, unspecified: Secondary | ICD-10-CM | POA: Diagnosis not present

## 2019-08-08 DIAGNOSIS — N2581 Secondary hyperparathyroidism of renal origin: Secondary | ICD-10-CM | POA: Diagnosis not present

## 2019-08-10 ENCOUNTER — Other Ambulatory Visit: Payer: Self-pay

## 2019-08-10 ENCOUNTER — Encounter: Payer: Self-pay | Admitting: Physician Assistant

## 2019-08-10 ENCOUNTER — Ambulatory Visit (INDEPENDENT_AMBULATORY_CARE_PROVIDER_SITE_OTHER): Payer: Medicare Other | Admitting: Physician Assistant

## 2019-08-10 VITALS — BP 92/60 | HR 83 | Temp 97.2°F | Resp 16 | Wt 283.4 lb

## 2019-08-10 DIAGNOSIS — Z992 Dependence on renal dialysis: Secondary | ICD-10-CM | POA: Diagnosis not present

## 2019-08-10 DIAGNOSIS — D631 Anemia in chronic kidney disease: Secondary | ICD-10-CM | POA: Diagnosis not present

## 2019-08-10 DIAGNOSIS — D509 Iron deficiency anemia, unspecified: Secondary | ICD-10-CM | POA: Diagnosis not present

## 2019-08-10 DIAGNOSIS — S8001XA Contusion of right knee, initial encounter: Secondary | ICD-10-CM

## 2019-08-10 DIAGNOSIS — L600 Ingrowing nail: Secondary | ICD-10-CM

## 2019-08-10 DIAGNOSIS — R197 Diarrhea, unspecified: Secondary | ICD-10-CM | POA: Diagnosis not present

## 2019-08-10 DIAGNOSIS — Z794 Long term (current) use of insulin: Secondary | ICD-10-CM

## 2019-08-10 DIAGNOSIS — N186 End stage renal disease: Secondary | ICD-10-CM | POA: Diagnosis not present

## 2019-08-10 DIAGNOSIS — N2581 Secondary hyperparathyroidism of renal origin: Secondary | ICD-10-CM | POA: Diagnosis not present

## 2019-08-10 DIAGNOSIS — R079 Chest pain, unspecified: Secondary | ICD-10-CM | POA: Diagnosis not present

## 2019-08-10 DIAGNOSIS — E1140 Type 2 diabetes mellitus with diabetic neuropathy, unspecified: Secondary | ICD-10-CM

## 2019-08-10 DIAGNOSIS — M62838 Other muscle spasm: Secondary | ICD-10-CM

## 2019-08-10 DIAGNOSIS — L304 Erythema intertrigo: Secondary | ICD-10-CM | POA: Diagnosis not present

## 2019-08-10 DIAGNOSIS — M67911 Unspecified disorder of synovium and tendon, right shoulder: Secondary | ICD-10-CM

## 2019-08-10 DIAGNOSIS — E1129 Type 2 diabetes mellitus with other diabetic kidney complication: Secondary | ICD-10-CM | POA: Diagnosis not present

## 2019-08-10 MED ORDER — DICYCLOMINE HCL 20 MG PO TABS: 20.0000 mg | ORAL_TABLET | Freq: Three times a day (TID) | ORAL | 0 refills | Status: DC

## 2019-08-10 MED ORDER — NYSTATIN 100000 UNIT/GM EX CREA: 1.0000 "application " | TOPICAL_CREAM | Freq: Two times a day (BID) | CUTANEOUS | 0 refills | Status: DC

## 2019-08-10 MED ORDER — PREGABALIN 150 MG PO CAPS: 150.0000 mg | ORAL_CAPSULE | Freq: Two times a day (BID) | ORAL | 5 refills | Status: DC

## 2019-08-10 MED ORDER — FREESTYLE LIBRE 14 DAY READER DEVI: 1.0000 | Freq: Three times a day (TID) | 0 refills | Status: DC

## 2019-08-10 MED ORDER — CEPHALEXIN 500 MG PO CAPS: 500.0000 mg | ORAL_CAPSULE | Freq: Two times a day (BID) | ORAL | 0 refills | Status: DC

## 2019-08-10 MED ORDER — CYCLOBENZAPRINE HCL 5 MG PO TABS: 5.0000 mg | ORAL_TABLET | Freq: Three times a day (TID) | ORAL | 5 refills | Status: DC | PRN

## 2019-08-10 MED ORDER — METHYLPREDNISOLONE 4 MG PO TBPK: ORAL_TABLET | ORAL | 0 refills | Status: DC

## 2019-08-10 MED ORDER — FREESTYLE LIBRE 14 DAY SENSOR MISC: 1.0000 | 4 refills | Status: DC

## 2019-08-10 NOTE — Progress Notes (Signed)
Patient: [name] Female    DOB: 05/05/1956   64 y.o.   MRN: [id number] Visit Date: 08/10/2019  Today's Provider: [name], PA-C   Chief Complaint  Patient presents with  . Arm Pain  . Toe Pain  . Chest Pain   Subjective:     HPI  Patient here today with c/o right arm has been hurting for the last 2 months. It is really hard for her to lift anything let alone hold her arm up. No known injury, just progressively having more pain in the right posterior shoulder region with difficulty carrying her purse on that side or lifting her arm.She denies any numbness or tingling.   Also complains of her left big toe hurting constantly and she can hardly wear any shoes due to pain. Has not seen a podiatrist in a while. She has crocs on today. Reports some redness to the medial great toe just beside the nail and pain. No discharge. Does have thick and curving nail. She is diabetic and does have peripheral neuropathy with complete foot numbness to the mid ankle region.   This week also she had episode of chest pain on the left side twice in the past 2 weeks. Occurs at rest. No association with dialysis. Pain occurs in the left upper abdomen to just under the left breast. Does not radiate.  Denies SOB, nausea, diaphoresis, visual disturbances or headache.   She reports diarrhea for months. Loose, watery stool. Can go up to 4-5 times per day. No blood or mucus.   Patient reports that she has fallen twice. First fall was 2 months ago coming out from [employer] She fell on the concrete. Reports no severe injury. Last fall was 3 weeks ago. She injured her right knee. Right knee is bruised and swollen, hurts to touch.     Allergies  Allergen Reactions  . Shellfish Allergy Anaphylaxis and Swelling  . Diazepam Other (See Comments)    "felt like out of body experience"     Current Outpatient Medications:  .  Accu-Chek FastClix Lancets MISC, USE AS DIRECTED TO CHECK BLOOD SUGAR  THREE TIMES DAILY AND AT BEDTIME, Disp: 102 each, Rfl: 0 .  albuterol (VENTOLIN HFA) 108 (90 Base) MCG/ACT inhaler, INHALE 2 PUFFS BY MOUTH EVERY 4 HOURS AS NEEDED FOR SHORTNESS OF BREATH, Disp: 18 g, Rfl: 5 .  allopurinol (ZYLOPRIM) 100 MG tablet, TAKE 1 TABLET BY MOUTH ONCE DAILY., Disp: 28 tablet, Rfl: 5 .  aspirin EC 81 MG EC tablet, Take 1 tablet (81 mg total) by mouth daily., Disp: , Rfl:  .  atorvastatin (LIPITOR) 80 MG tablet, TAKE 1 TABLET BY MOUTH ONCE DAILY AT 6PM, Disp: 28 tablet, Rfl: 3 .  AURYXIA 1 GM 210 MG(Fe) tablet, Take 420 mg by mouth 2 (two) times daily with a meal. , Disp: , Rfl:  .  carvedilol (COREG) 25 MG tablet, TAKE (1) TABLET BY MOUTH TWICE A DAY WITH MEALS (BREAKFAST AND SUPPER), Disp: 56 tablet, Rfl: 3 .  cyclobenzaprine (FLEXERIL) 5 MG tablet, Take 1 tablet (5 mg total) by mouth 3 (three) times daily as needed for muscle spasms., Disp: 15 tablet, Rfl: 0 .  docusate sodium (COLACE) 100 MG capsule, Take 1 capsule (100 mg total) by mouth 2 (two) times daily as needed for mild constipation., Disp: 30 capsule, Rfl: 0 .  fluticasone (FLONASE) 50 MCG/ACT nasal spray, Place 2 sprays into both nostrils daily as needed for  allergies or rhinitis., Disp: 48 g, Rfl: 1 .  hydrALAZINE (APRESOLINE) 100 MG tablet, TAKE (1) TABLET BY MOUTH THREE TIMES DAILY, Disp: 84 tablet, Rfl: 5 .  hydrOXYzine (ATARAX/VISTARIL) 25 MG tablet, Take 1 tablet (25 mg total) by mouth every 6 (six) hours as needed for itching., Disp: 120 tablet, Rfl: 10 .  LEVEMIR FLEXTOUCH 100 UNIT/ML Pen, Inject 60 Units into the skin 2 (two) times a day., Disp: , Rfl:  .  lidocaine-prilocaine (EMLA) cream, Apply 1 application topically as needed., Disp: 30 g, Rfl: 11 .  liraglutide (VICTOZA) 18 MG/3ML SOPN, Start with 0.6mg  daily and increase by 0.6mg  per week until max dose of 1.8 mg dose achieved., Disp: 9 mL, Rfl: 10 .  loratadine (CLARITIN) 10 MG tablet, TAKE 1 TABLET BY MOUTH ONCE DAILY., Disp: 28 tablet, Rfl: 10 .   mometasone (ELOCON) 0.1 % cream, APPLY AS DIRECTED TO AFFECTED AREA ONCE DAILY., Disp: 45 g, Rfl: 10 .  mometasone-formoterol (DULERA) 200-5 MCG/ACT AERO, Inhale 2 puffs into the lungs 2 (two) times daily., Disp: 13 g, Rfl: 5 .  montelukast (SINGULAIR) 10 MG tablet, TAKE ONE TABLET BY MOUTH AT BEDTIME., Disp: 28 tablet, Rfl: 5 .  nitroGLYCERIN (NITROSTAT) 0.4 MG SL tablet, Place 1 tablet (0.4 mg total) under the tongue every 5 (five) minutes x 3 doses as needed for chest pain., Disp: 25 tablet, Rfl: 12 .  NOVOLOG FLEXPEN 100 UNIT/ML FlexPen, USE 25 UNITS UNDER THE SKIN THREE TIMES DAILY WITH MEALS, Disp: 15 mL, Rfl: 10 .  omeprazole (PRILOSEC) 20 MG capsule, TAKE (1) CAPSULE BY MOUTH ONCE DAILY., Disp: 28 capsule, Rfl: 10 .  pregabalin (LYRICA) 75 MG capsule, TAKE (1) CAPSULE BY MOUTH TWICE DAILY., Disp: 56 capsule, Rfl: 5 .  SENNA PLUS 8.6-50 MG tablet, TAKE ONE TABLET BY MOUTH AT BEDTIME., Disp: 28 tablet, Rfl: 10 .  silver sulfADIAZINE (SILVADENE) 1 % cream, Apply 1 application topically daily. Apply to right first toe, Disp: 50 g, Rfl: 0 .  torsemide (DEMADEX) 20 MG tablet, TAKE (2) TABLETS BY MOUTH TWICE DAILY., Disp: 112 tablet, Rfl: 11 .  traMADol (ULTRAM) 50 MG tablet, TAKE (1) OR (2) TABLETS EVERY SIX HOURS AS NEEDED FOR PAIN. -MAY MAKEDROWSY-, Disp: 40 tablet, Rfl: 0 .  triamcinolone cream (KENALOG) 0.1 %, APPLY TO AFFECTED AREA TWICE DAILY, Disp: 80 g, Rfl: 0 .  ULTICARE SHORT PEN NEEDLES 31G X 8 MM MISC, USE THREE TIMES DAILY WITH INSULIN, Disp: 100 each, Rfl: 5 .  valACYclovir (VALTREX) 500 MG tablet, TAKE (1) TABLET BY MOUTH EVERY OTHER DAY., Disp: 14 tablet, Rfl: 10 .  Vitamin D, Ergocalciferol, (DRISDOL) 1.25 MG (50000 UT) CAPS capsule, TAKE 1 CAPSULE BY MOUTH ONCE A MONTH, Disp: 1 capsule, Rfl: 5 .  lidocaine (LIDODERM) 5 %, Place 1 patch onto the skin every 12 (twelve) hours. Remove & Discard patch within 12 hours or as directed by MD (Patient not taking: Reported on 08/10/2019),  Disp: 10 patch, Rfl: 0  Review of Systems  Constitutional: Negative for fever.  Eyes: Negative for visual disturbance.  Respiratory: Negative for chest tightness and shortness of breath.   Cardiovascular: Positive for chest pain ("twice in the past 2 weeks") and leg swelling. Negative for palpitations.  Musculoskeletal: Positive for arthralgias, joint swelling and myalgias.  Neurological: Negative for dizziness and headaches.    Social History   Tobacco Use  . Smoking status: Former Smoker    Packs/day: 0.25    Years: 6.00  Pack years: 1.50    Types: Cigarettes    Quit date: 10/25/1980    Years since quitting: 38.8  . Smokeless tobacco: Never Used  Substance Use Topics  . Alcohol use: Yes    Comment: rarely      Objective:   BP 92/60 (BP Location: Left Arm, Patient Position: Sitting, Cuff Size: Large)   Pulse 83   Temp (!) 97.2 F (36.2 C) (Temporal)   Resp 16   Wt 283 lb 6.4 oz (128.5 kg)   BMI 44.39 kg/m  Vitals:   08/10/19 1132  BP: 92/60  Pulse: 83  Resp: 16  Temp: (!) 97.2 F (36.2 C)  TempSrc: Temporal  Weight: 283 lb 6.4 oz (128.5 kg)  Body mass index is 44.39 kg/m.   Physical Exam Vitals reviewed.  Constitutional:      General: She is not in acute distress.    Appearance: Normal appearance. She is well-developed. She is obese. She is not ill-appearing or diaphoretic.  HENT:     Head: Normocephalic and atraumatic.  Eyes:     Extraocular Movements: Extraocular movements intact.     Pupils: Pupils are equal, round, and reactive to light.  Neck:     Vascular: No JVD.  Cardiovascular:     Rate and Rhythm: Normal rate and regular rhythm.     Pulses:          Dorsalis pedis pulses are 1+ on the right side and 1+ on the left side.       Posterior tibial pulses are 1+ on the right side and 1+ on the left side.     Heart sounds: Normal heart sounds. No murmur. No friction rub. No gallop.   Pulmonary:     Effort: Pulmonary effort is normal. No  respiratory distress.     Breath sounds: Normal breath sounds. No wheezing or rales.  Chest:     Chest wall: No mass or tenderness.  Musculoskeletal:     Right shoulder: Tenderness present. No swelling, effusion, bony tenderness or crepitus. Decreased range of motion. Normal strength. Normal pulse.     Left shoulder: Normal.     Cervical back: Normal range of motion and neck supple.     Right knee: Swelling and ecchymosis present. No effusion.     Left knee: Normal.     Right foot: Normal range of motion. No deformity, bunion, Charcot foot, foot drop or prominent metatarsal heads.     Left foot: Normal range of motion. No deformity, bunion, Charcot foot, foot drop or prominent metatarsal heads.       Legs:  Feet:     Right foot:     Skin integrity: Dry skin present.     Toenail Condition: Right toenails are normal.     Left foot:     Skin integrity: Dry skin present.     Toenail Condition: Left toenails are abnormally thick, long and ingrown.     Comments: Left great toe with curled nail and ingrown appearance with redness and pain on the medial nail border Skin:    Capillary Refill: Capillary refill takes less than 2 seconds.     Comments: Well healed surgical incisions on the left medial upper arm for basilic A-V fistula. Dry dressing in place from dialysis access today.  Neurological:     General: No focal deficit present.     Mental Status: She is alert and oriented to person, place, and time. Mental status is at baseline.  No results found for any visits on 08/10/19.     Assessment & Plan    1. Chest pain, unspecified type EKG today shows NSR rate of 84. No ST segment changes. - EKG 12-Lead  2. Intertrigo Noted under breast. Nystatin prescribed. - nystatin cream (MYCOSTATIN); Apply 1 application topically 2 (two) times daily.  Dispense: 30 g; Refill: 0  3. Ingrown nail of great toe of left foot Noted on left great toe. Will treat with keflex. Can soak in epsom  salt and warm water and try to push skin back off nail. Referral to podiatry since she is diabetic and has neuropathy.  - Ambulatory referral to Podiatry - cephALEXin (KEFLEX) 500 MG capsule; Take 1 capsule (500 mg total) by mouth 2 (two) times daily.  Dispense: 14 capsule; Refill: 0  4. Muscle spasm Noted on right upper back. Also gets cramps with dialysis. Will give flexeril as below prn.  - cyclobenzaprine (FLEXERIL) 5 MG tablet; Take 1 tablet (5 mg total) by mouth 3 (three) times daily as needed for muscle spasms.  Dispense: 30 tablet; Refill: 5  5. Tendinopathy of right rotator cuff Noted of right shoulder. Will try medrol as below. Advised to monitor sugars closely. Hopefully will get some benefit.  - methylPREDNISolone (MEDROL) 4 MG TBPK tablet; 6 day taper; take as directed on package instructions  Dispense: 21 tablet; Refill: 0  6. Type 2 diabetes mellitus with diabetic neuropathy, with long-term current use of insulin (HCC) Increase Lyrica for neuropathic pain. Will also see if Elenor Legato is covered now.  - pregabalin (LYRICA) 150 MG capsule; Take 1 capsule (150 mg total) by mouth 2 (two) times daily.  Dispense: 60 capsule; Refill: 5 - Continuous Blood Gluc Receiver (FREESTYLE LIBRE 14 DAY READER) DEVI; 1 each by Does not apply route 4 (four) times daily -  before meals and at bedtime.  Dispense: 1 each; Refill: 0 - Continuous Blood Gluc Sensor (FREESTYLE LIBRE 14 DAY SENSOR) MISC; 1 each by Does not apply route every 14 (fourteen) days. Check blood sugar ACHS  Dispense: 6 each; Refill: 4  7. Diarrhea, unspecified type Newly noted but patient has had for months. Suspect IBS D vs functional. Will try Bentyl as below. Call if not working.  - dicyclomine (BENTYL) 20 MG tablet; Take 1 tablet (20 mg total) by mouth 4 (four) times daily -  before meals and at bedtime.  Dispense: 120 tablet; Refill: 0  8. Traumatic hematoma of right knee, initial encounter Noted hematoma. Will try medrol as  above for shoulder and advised to use heating pad over area to decrease size of hematoma.      [name], PA-C  [facility]

## 2019-08-10 NOTE — Patient Instructions (Signed)
Rotator Cuff Tendinitis  Rotator cuff tendinitis is inflammation of the tough, cord-like bands that connect muscle to bone (tendons) in the rotator cuff. The rotator cuff includes all of the muscles and tendons that connect the arm to the shoulder. The rotator cuff holds the head of the upper arm bone (humerus) in the cup (fossa) of the shoulder blade (scapula). This condition can lead to a long-lasting (chronic) tear. The tear may be partial or complete. What are the causes? This condition is usually caused by overusing the rotator cuff. What increases the risk? This condition is more likely to develop in athletes and workers who frequently use their shoulder or reach over their heads. This can include activities such as:  Tennis.  Baseball or softball.  Swimming.  Construction work.  Painting. What are the signs or symptoms? Symptoms of this condition include:  Pain spreading (radiating) from the shoulder to the upper arm.  Swelling and tenderness in front of the shoulder.  Pain when reaching, pulling, or lifting the arm above the head.  Pain when lowering the arm from above the head.  Minor pain in the shoulder when resting.  Increased pain in the shoulder at night.  Difficulty placing the arm behind the back. How is this diagnosed? This condition is diagnosed with a medical history and physical exam. Tests may also be done, including:  X-rays.  MRI.  Ultrasounds.  CT or MR arthrogram. During this test, a contrast material is injected and then images are taken. How is this treated? Treatment for this condition depends on the severity of the condition. In less severe cases, treatment may include:  Rest. This may be done with a sling that holds the shoulder still (immobilization). Your health care provider may also recommend avoiding activities that involve lifting your arm over your head.  Icing the shoulder.  Anti-inflammatory medicines, such as aspirin or  ibuprofen. In more severe cases, treatment may include:  Physical therapy.  Steroid injections.  Surgery. Follow these instructions at home: If you have a sling:  Wear the sling as told by your health care provider. Remove it only as told by your health care provider.  Loosen the sling if your fingers tingle, become numb, or turn cold and blue.  Keep the sling clean.  If the sling is not waterproof, do not let it get wet. Remove it, if allowed, or cover it with a watertight covering when you take a bath or shower. Managing pain, stiffness, and swelling  If directed, put ice on the injured area. ? If you have a removable sling, remove it as told by your health care provider. ? Put ice in a plastic bag. ? Place a towel between your skin and the bag. ? Leave the ice on for 20 minutes, 2-3 times a day.  Move your fingers often to avoid stiffness and to lessen swelling.  Raise (elevate) the injured area above the level of your heart while you are lying down.  Find a comfortable sleeping position or sleep on a recliner, if available. Driving  Do not drive or use heavy machinery while taking prescription pain medicine.  Ask your health care provider when it is safe to drive if you have a sling on your arm. Activity  Rest your shoulder as told by your health care provider.  Return to your normal activities as told by your health care provider. Ask your health care provider what activities are safe for you.  Do any exercises or stretches as   told by your health care provider.  If you do repetitive overhead tasks, take small breaks in between and include stretching exercises as told by your health care provider. General instructions  Do not use any products that contain nicotine or tobacco, such as cigarettes and e-cigarettes. These can delay healing. If you need help quitting, ask your health care provider.  Take over-the-counter and prescription medicines only as told by your  health care provider.  Keep all follow-up visits as told by your health care provider. This is important. Contact a health care provider if:  Your pain gets worse.  You have new pain in your arm, hands, or fingers.  Your pain is not relieved with medicine or does not get better after 6 weeks of treatment.  You have cracking sensations when moving your shoulder in certain directions.  You hear a snapping sound after using your shoulder, followed by severe pain and weakness. Get help right away if:  Your arm, hand, or fingers are numb or tingling.  Your arm, hand, or fingers are swollen or painful or they turn white or blue. Summary  Rotator cuff tendinitis is inflammation of the tough, cord-like bands that connect muscle to bone (tendons) in the rotator cuff.  This condition is usually caused by overusing the rotator cuff, which includes all of the muscles and tendons that connect the arm to the shoulder.  This condition is more likely to develop in athletes and workers who frequently use their shoulder or reach over their heads.  Treatment generally includes rest, anti-inflammatory medicines, and icing. In some cases, physical therapy and steroid injections may be needed. In severe cases, surgery may be needed. This information is not intended to replace advice given to you by your health care provider. Make sure you discuss any questions you have with your health care provider. Document Revised: 10/21/2018 Document Reviewed: 06/15/2016 Elsevier Patient Education  [place].   Shoulder Exercises Ask your health care provider which exercises are safe for you. Do exercises exactly as told by your health care provider and adjust them as directed. It is normal to feel mild stretching, pulling, tightness, or discomfort as you do these exercises. Stop right away if you feel sudden pain or your pain gets worse. Do not begin these exercises until told by your health care  provider. Stretching exercises External rotation and abduction This exercise is sometimes called corner stretch. This exercise rotates your arm outward (external rotation) and moves your arm out from your body (abduction). 1. Stand in a doorway with one of your feet slightly in front of the other. This is called a staggered stance. If you cannot reach your forearms to the door frame, stand facing a corner of a room. 2. Choose one of the following positions as told by your health care provider: ? Place your hands and forearms on the door frame above your head. ? Place your hands and forearms on the door frame at the height of your head. ? Place your hands on the door frame at the height of your elbows. 3. Slowly move your weight onto your front foot until you feel a stretch across your chest and in the front of your shoulders. Keep your head and chest upright and keep your abdominal muscles tight. 4. Hold for __________ seconds. 5. To release the stretch, shift your weight to your back foot. Repeat __________ times. Complete this exercise __________ times a day. Extension, standing 1. Stand and hold a broomstick, a cane,  or a similar object behind your back. ? Your hands should be a little wider than shoulder width apart. ? Your palms should face away from your back. 2. Keeping your elbows straight and your shoulder muscles relaxed, move the stick away from your body until you feel a stretch in your shoulders (extension). ? Avoid shrugging your shoulders while you move the stick. Keep your shoulder blades tucked down toward the middle of your back. 3. Hold for __________ seconds. 4. Slowly return to the starting position. Repeat __________ times. Complete this exercise __________ times a day. Range-of-motion exercises Pendulum  1. Stand near a wall or a surface that you can hold onto for balance. 2. Bend at the waist and let your left / right arm hang straight down. Use your other arm to  support you. Keep your back straight and do not lock your knees. 3. Relax your left / right arm and shoulder muscles, and move your hips and your trunk so your left / right arm swings freely. Your arm should swing because of the motion of your body, not because you are using your arm or shoulder muscles. 4. Keep moving your hips and trunk so your arm swings in the following directions, as told by your health care provider: ? Side to side. ? Forward and backward. ? In clockwise and counterclockwise circles. 5. Continue each motion for __________ seconds, or for as long as told by your health care provider. 6. Slowly return to the starting position. Repeat __________ times. Complete this exercise __________ times a day. Shoulder flexion, standing  1. Stand and hold a broomstick, a cane, or a similar object. Place your hands a little more than shoulder width apart on the object. Your left / right hand should be palm up, and your other hand should be palm down. 2. Keep your elbow straight and your shoulder muscles relaxed. Push the stick up with your healthy arm to raise your left / right arm in front of your body, and then over your head until you feel a stretch in your shoulder (flexion). ? Avoid shrugging your shoulder while you raise your arm. Keep your shoulder blade tucked down toward the middle of your back. 3. Hold for __________ seconds. 4. Slowly return to the starting position. Repeat __________ times. Complete this exercise __________ times a day. Shoulder abduction, standing 1. Stand and hold a broomstick, a cane, or a similar object. Place your hands a little more than shoulder width apart on the object. Your left / right hand should be palm up, and your other hand should be palm down. 2. Keep your elbow straight and your shoulder muscles relaxed. Push the object across your body toward your left / right side. Raise your left / right arm to the side of your body (abduction) until you  feel a stretch in your shoulder. ? Do not raise your arm above shoulder height unless your health care provider tells you to do that. ? If directed, raise your arm over your head. ? Avoid shrugging your shoulder while you raise your arm. Keep your shoulder blade tucked down toward the middle of your back. 3. Hold for __________ seconds. 4. Slowly return to the starting position. Repeat __________ times. Complete this exercise __________ times a day. Internal rotation  1. Place your left / right hand behind your back, palm up. 2. Use your other hand to dangle an exercise band, a towel, or a similar object over your shoulder. Grasp the band with  your left / right hand so you are holding on to both ends. 3. Gently pull up on the band until you feel a stretch in the front of your left / right shoulder. The movement of your arm toward the center of your body is called internal rotation. ? Avoid shrugging your shoulder while you raise your arm. Keep your shoulder blade tucked down toward the middle of your back. 4. Hold for __________ seconds. 5. Release the stretch by letting go of the band and lowering your hands. Repeat __________ times. Complete this exercise __________ times a day. Strengthening exercises External rotation  1. Sit in a stable chair without armrests. 2. Secure an exercise band to a stable object at elbow height on your left / right side. 3. Place a soft object, such as a folded towel or a small pillow, between your left / right upper arm and your body to move your elbow about 4 inches (10 cm) away from your side. 4. Hold the end of the exercise band so it is tight and there is no slack. 5. Keeping your elbow pressed against the soft object, slowly move your forearm out, away from your abdomen (external rotation). Keep your body steady so only your forearm moves. 6. Hold for __________ seconds. 7. Slowly return to the starting position. Repeat __________ times. Complete this  exercise __________ times a day. Shoulder abduction  1. Sit in a stable chair without armrests, or stand up. 2. Hold a __________ weight in your left / right hand, or hold an exercise band with both hands. 3. Start with your arms straight down and your left / right palm facing in, toward your body. 4. Slowly lift your left / right hand out to your side (abduction). Do not lift your hand above shoulder height unless your health care provider tells you that this is safe. ? Keep your arms straight. ? Avoid shrugging your shoulder while you do this movement. Keep your shoulder blade tucked down toward the middle of your back. 5. Hold for __________ seconds. 6. Slowly lower your arm, and return to the starting position. Repeat __________ times. Complete this exercise __________ times a day. Shoulder extension 1. Sit in a stable chair without armrests, or stand up. 2. Secure an exercise band to a stable object in front of you so it is at shoulder height. 3. Hold one end of the exercise band in each hand. Your palms should face each other. 4. Straighten your elbows and lift your hands up to shoulder height. 5. Step back, away from the secured end of the exercise band, until the band is tight and there is no slack. 6. Squeeze your shoulder blades together as you pull your hands down to the sides of your thighs (extension). Stop when your hands are straight down by your sides. Do not let your hands go behind your body. 7. Hold for __________ seconds. 8. Slowly return to the starting position. Repeat __________ times. Complete this exercise __________ times a day. Shoulder row 1. Sit in a stable chair without armrests, or stand up. 2. Secure an exercise band to a stable object in front of you so it is at waist height. 3. Hold one end of the exercise band in each hand. Position your palms so that your thumbs are facing the ceiling (neutral position). 4. Bend each of your elbows to a 90-degree angle  (right angle) and keep your upper arms at your sides. 5. Step back until the band is  tight and there is no slack. 6. Slowly pull your elbows back behind you. 7. Hold for __________ seconds. 8. Slowly return to the starting position. Repeat __________ times. Complete this exercise __________ times a day. Shoulder press-ups  1. Sit in a stable chair that has armrests. Sit upright, with your feet flat on the floor. 2. Put your hands on the armrests so your elbows are bent and your fingers are pointing forward. Your hands should be about even with the sides of your body. 3. Push down on the armrests and use your arms to lift yourself off the chair. Straighten your elbows and lift yourself up as much as you comfortably can. ? Move your shoulder blades down, and avoid letting your shoulders move up toward your ears. ? Keep your feet on the ground. As you get stronger, your feet should support less of your body weight as you lift yourself up. 4. Hold for __________ seconds. 5. Slowly lower yourself back into the chair. Repeat __________ times. Complete this exercise __________ times a day. Wall push-ups  1. Stand so you are facing a stable wall. Your feet should be about one arm-length away from the wall. 2. Lean forward and place your palms on the wall at shoulder height. 3. Keep your feet flat on the floor as you bend your elbows and lean forward toward the wall. 4. Hold for __________ seconds. 5. Straighten your elbows to push yourself back to the starting position. Repeat __________ times. Complete this exercise __________ times a day. This information is not intended to replace advice given to you by your health care provider. Make sure you discuss any questions you have with your health care provider. Document Revised: 10/21/2018 Document Reviewed: 07/29/2018 Elsevier Patient Education  [place].

## 2019-08-11 ENCOUNTER — Other Ambulatory Visit: Payer: Self-pay | Admitting: Physician Assistant

## 2019-08-11 DIAGNOSIS — J9601 Acute respiratory failure with hypoxia: Secondary | ICD-10-CM

## 2019-08-11 DIAGNOSIS — J301 Allergic rhinitis due to pollen: Secondary | ICD-10-CM

## 2019-08-11 DIAGNOSIS — G629 Polyneuropathy, unspecified: Secondary | ICD-10-CM

## 2019-08-11 DIAGNOSIS — K219 Gastro-esophageal reflux disease without esophagitis: Secondary | ICD-10-CM

## 2019-08-11 DIAGNOSIS — I10 Essential (primary) hypertension: Secondary | ICD-10-CM

## 2019-08-11 DIAGNOSIS — Z452 Encounter for adjustment and management of vascular access device: Secondary | ICD-10-CM | POA: Diagnosis not present

## 2019-08-11 NOTE — Telephone Encounter (Signed)
Requested medication (s) are due for refill today:yes  Requested medication (s) are on the active medication list:yes  Last refill:  03/16/2019  Future visit scheduled: no  Notes to clinic:  lab values low    Requested Prescriptions  Pending Prescriptions Disp Refills   hydrALAZINE (APRESOLINE) 100 MG tablet [Pharmacy Med Name: HYDRALAZINE 100 MG TABLET] 84 tablet 11    Sig: TAKE (1) TABLET BY MOUTH THREE TIMES DAILY      Cardiovascular:  Vasodilators Failed - 08/11/2019  4:59 PM      Failed - HCT in normal range and within 360 days    HCT  Date Value Ref Range Status  06/29/2019 34.9 (L) 36.0 - 46.0 % Final   Hematocrit  Date Value Ref Range Status  01/10/2018 28.2 (L) 34.0 - 46.6 % Final          Failed - HGB in normal range and within 360 days    Hemoglobin  Date Value Ref Range Status  06/29/2019 11.2 (L) 12.0 - 15.0 g/dL Final  01/10/2018 8.9 (L) 11.1 - 15.9 g/dL Final          Failed - RBC in normal range and within 360 days    RBC  Date Value Ref Range Status  06/29/2019 3.55 (L) 3.87 - 5.11 MIL/uL Final          Passed - WBC in normal range and within 360 days    WBC  Date Value Ref Range Status  06/29/2019 7.9 4.0 - 10.5 K/uL Final          Passed - PLT in normal range and within 360 days    Platelets  Date Value Ref Range Status  06/29/2019 174 150 - 400 K/uL Final  01/10/2018 285 150 - 450 x10E3/uL Final          Passed - Last BP in normal range    BP Readings from Last 1 Encounters:  08/10/19 92/60          Passed - Valid encounter within last 12 months    Recent Outpatient Visits           Yesterday Chest pain, unspecified type   [place], [place], [place]   2 months ago Acute right-sided low back pain without sciatica   [place], [place] E, [place]   5 months ago Chronic hepatitis C without hepatic coma [facility])   [place], Clearnce Sorrel, PA-C   1 year ago ESRD  on dialysis [facility])   [place], [place], [place]   1 year ago Diarrhea, unspecified type   [place], Adriana M, [place]               Signed Prescriptions Disp Refills   montelukast (SINGULAIR) 10 MG tablet 28 tablet 11    Sig: TAKE ONE TABLET BY MOUTH AT BEDTIME.      Pulmonology:  Leukotriene Inhibitors Passed - 08/11/2019  4:59 PM      Passed - Valid encounter within last 12 months    Recent Outpatient Visits           Yesterday Chest pain, unspecified type   [place], [place]   2 months ago Acute right-sided low back pain without sciatica   [place], [place]   5 months ago Chronic hepatitis C without hepatic coma [facility])   [facility] [place], [place], [place]  1 year ago ESRD on dialysis [facility])   [place], [place], [place]   1 year ago Diarrhea, unspecified type   [place], Adriana M, [place]                omeprazole (PRILOSEC) 20 MG capsule 28 capsule 11    Sig: TAKE (1) CAPSULE BY MOUTH ONCE DAILY.      Gastroenterology: Proton Pump Inhibitors Passed - 08/11/2019  4:59 PM      Passed - Valid encounter within last 12 months    Recent Outpatient Visits           Yesterday Chest pain, unspecified type   [facility] [place], [place], [place]   2 months ago Acute right-sided low back pain without sciatica   [place], [place], [place]   5 months ago Chronic hepatitis C without hepatic coma [facility])   [place], Clearnce Sorrel, [place]   1 year ago ESRD on dialysis [facility])   [place], [place], [place]   1 year ago Diarrhea, unspecified type   [facility], Adriana M, PA-C                SENNA PLUS 8.6-50 MG tablet 28 tablet 11    Sig: TAKE ONE TABLET BY MOUTH AT BEDTIME.       Over the Counter:  OTC Passed - 08/11/2019  4:59 PM      Passed - Valid encounter within last 12 months    Recent Outpatient Visits           Yesterday Chest pain, unspecified type   [place], [place]   2 months ago Acute right-sided low back pain without sciatica   [place], [place]   5 months ago Chronic hepatitis C without hepatic coma [facility])   [place], Clearnce Sorrel, [place]   1 year ago ESRD on dialysis [facility])   [place], [place], [place]   1 year ago Diarrhea, unspecified type   [place], Adriana M, PA-C                loratadine (CLARITIN) 10 MG tablet 28 tablet 11    Sig: TAKE 1 TABLET BY MOUTH ONCE DAILY.      Ear, Nose, and Throat:  Antihistamines Passed - 08/11/2019  4:59 PM      Passed - Valid encounter within last 12 months    Recent Outpatient Visits           Yesterday Chest pain, unspecified type   [place], [place]   2 months ago Acute right-sided low back pain without sciatica   [place], [place]   5 months ago Chronic hepatitis C without hepatic coma [facility])   [place], Clearnce Sorrel, [place]   1 year ago ESRD on dialysis [facility])   [place], [place], [place]   1 year ago Diarrhea, unspecified type   [place], [place], [place]

## 2019-08-12 DIAGNOSIS — Z992 Dependence on renal dialysis: Secondary | ICD-10-CM | POA: Diagnosis not present

## 2019-08-12 DIAGNOSIS — D509 Iron deficiency anemia, unspecified: Secondary | ICD-10-CM | POA: Diagnosis not present

## 2019-08-12 DIAGNOSIS — N2581 Secondary hyperparathyroidism of renal origin: Secondary | ICD-10-CM | POA: Diagnosis not present

## 2019-08-12 DIAGNOSIS — N186 End stage renal disease: Secondary | ICD-10-CM | POA: Diagnosis not present

## 2019-08-12 DIAGNOSIS — E1129 Type 2 diabetes mellitus with other diabetic kidney complication: Secondary | ICD-10-CM | POA: Diagnosis not present

## 2019-08-12 DIAGNOSIS — D631 Anemia in chronic kidney disease: Secondary | ICD-10-CM | POA: Diagnosis not present

## 2019-08-14 DIAGNOSIS — E1122 Type 2 diabetes mellitus with diabetic chronic kidney disease: Secondary | ICD-10-CM | POA: Diagnosis not present

## 2019-08-14 DIAGNOSIS — N186 End stage renal disease: Secondary | ICD-10-CM | POA: Diagnosis not present

## 2019-08-14 DIAGNOSIS — Z992 Dependence on renal dialysis: Secondary | ICD-10-CM | POA: Diagnosis not present

## 2019-08-16 DIAGNOSIS — E1129 Type 2 diabetes mellitus with other diabetic kidney complication: Secondary | ICD-10-CM | POA: Diagnosis not present

## 2019-08-16 DIAGNOSIS — D509 Iron deficiency anemia, unspecified: Secondary | ICD-10-CM | POA: Diagnosis not present

## 2019-08-16 DIAGNOSIS — D631 Anemia in chronic kidney disease: Secondary | ICD-10-CM | POA: Diagnosis not present

## 2019-08-16 DIAGNOSIS — N186 End stage renal disease: Secondary | ICD-10-CM | POA: Diagnosis not present

## 2019-08-16 DIAGNOSIS — Z992 Dependence on renal dialysis: Secondary | ICD-10-CM | POA: Diagnosis not present

## 2019-08-16 DIAGNOSIS — N2581 Secondary hyperparathyroidism of renal origin: Secondary | ICD-10-CM | POA: Diagnosis not present

## 2019-08-16 IMAGING — DX DG CHEST 2V
2 series · 2 of 2 positions shown · non-contrast
Comparison: 03/17/2017

CLINICAL DATA: Shortness of breath on exertion today. Left-sided
chest pain. History of CHF, diabetes, hypertension.

EXAM:
CHEST  2 VIEW

[chest pa]
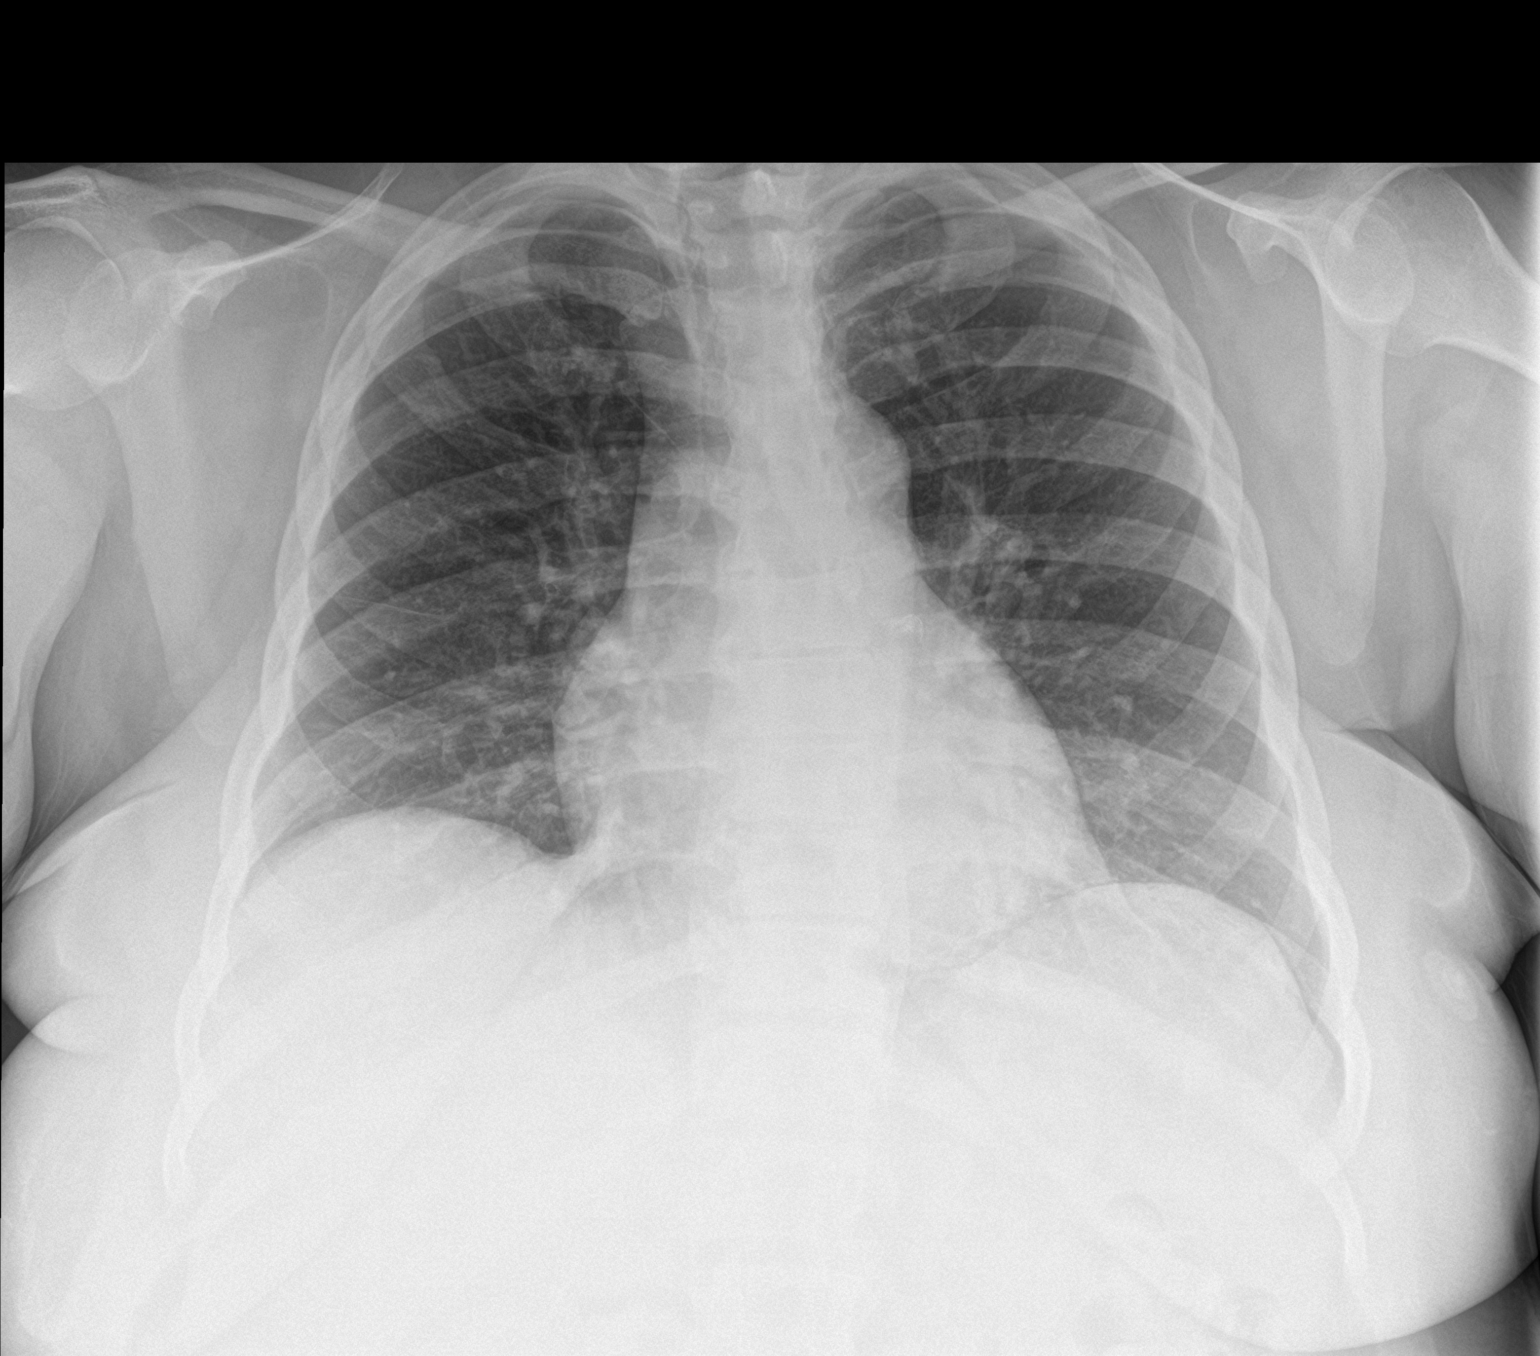

[chest lat]
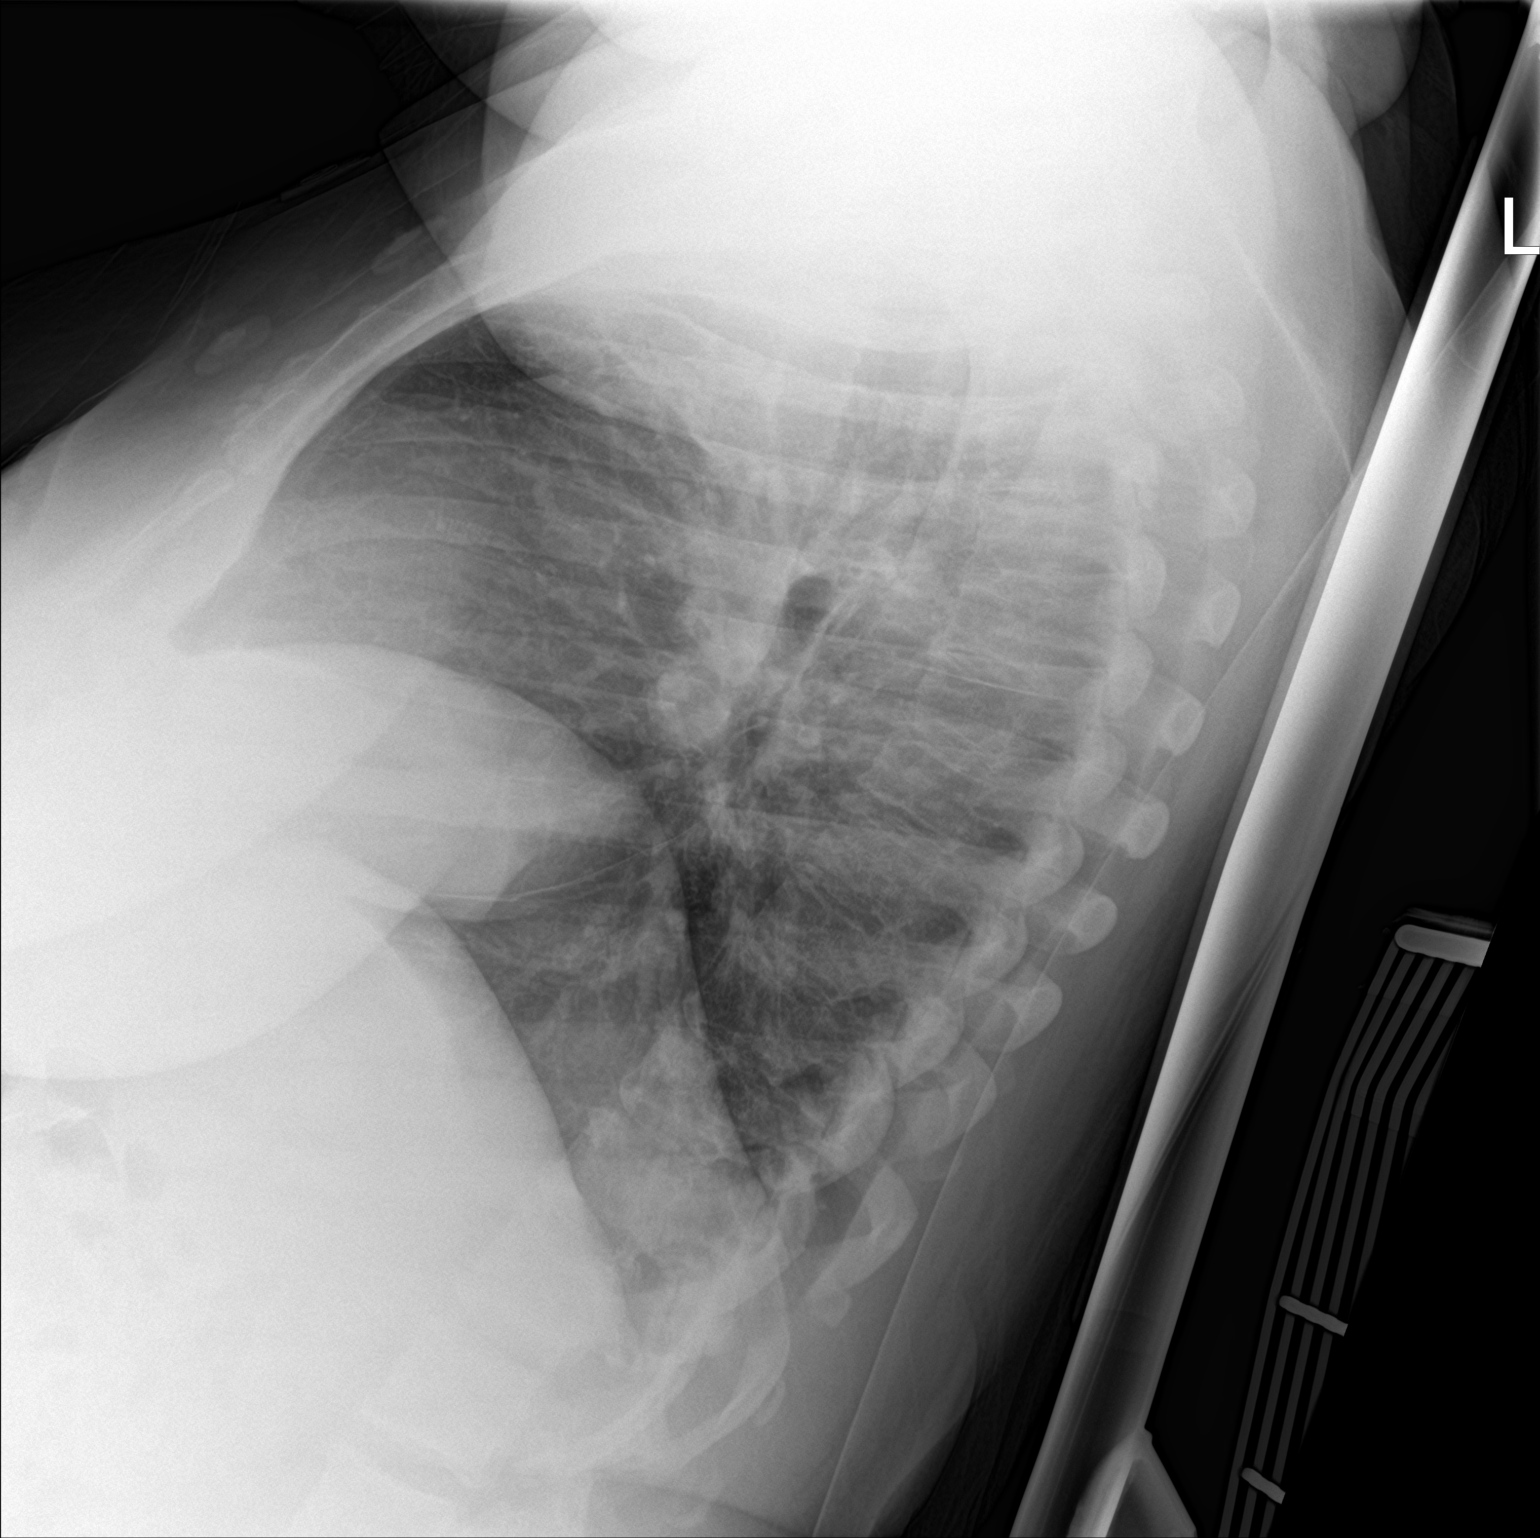

[2 of 2 positions shown; findings below may reference images not displayed]

FINDINGS: The heart size and mediastinal contours are within normal limits.
Both lungs are clear. The visualized skeletal structures are
unremarkable.
IMPRESSION: No active cardiopulmonary disease.

## 2019-08-17 ENCOUNTER — Other Ambulatory Visit: Payer: Self-pay | Admitting: Physician Assistant

## 2019-08-17 NOTE — Telephone Encounter (Signed)
Requested medication (s) are due for refill today:   Yes  Requested medication (s) are on the active medication list:   Yes  Future visit scheduled:   No  Was seen one week ago   Last ordered: 01/19/2019  This is a historical medication.      Requested Prescriptions  Pending Prescriptions Disp Refills   LEVEMIR FLEXTOUCH 100 UNIT/ML Pen [Pharmacy Med Name: LEVEMIR FLEX TOUCH PEN INJ 3ML] 15 mL     Sig: INJECT 60 UNITS UNDER THE SKIN TWICE DAILY      Endocrinology:  Diabetes - Insulins Failed - 08/17/2019 12:37 PM      Failed - HBA1C is between 0 and 7.9 and within 180 days    Hgb A1c MFr Bld  Date Value Ref Range Status  12/28/2018 9.6 (H) 4.8 - 5.6 % Final    Comment:    (NOTE) Pre diabetes:          5.7%-6.4% Diabetes:              >6.4% Glycemic control for   <7.0% adults with diabetes           Passed - Valid encounter within last 6 months    Recent Outpatient Visits           1 week ago Chest pain, unspecified type   [place], [place]   3 months ago Acute right-sided low back pain without sciatica   [place], [place]   6 months ago Chronic hepatitis C without hepatic coma [facility])   [place], Clearnce Sorrel, PA-C   1 year ago ESRD on dialysis [facility])   [place], [place], [place]   1 year ago Diarrhea, unspecified type   [place], [place], [place]

## 2019-08-18 IMAGING — DX DG CHEST 2V
2 series · 2 of 2 positions shown · non-contrast
Comparison: 04/28/2017

CLINICAL DATA: Dyspnea today.

EXAM:
CHEST  2 VIEW

[chest lat]
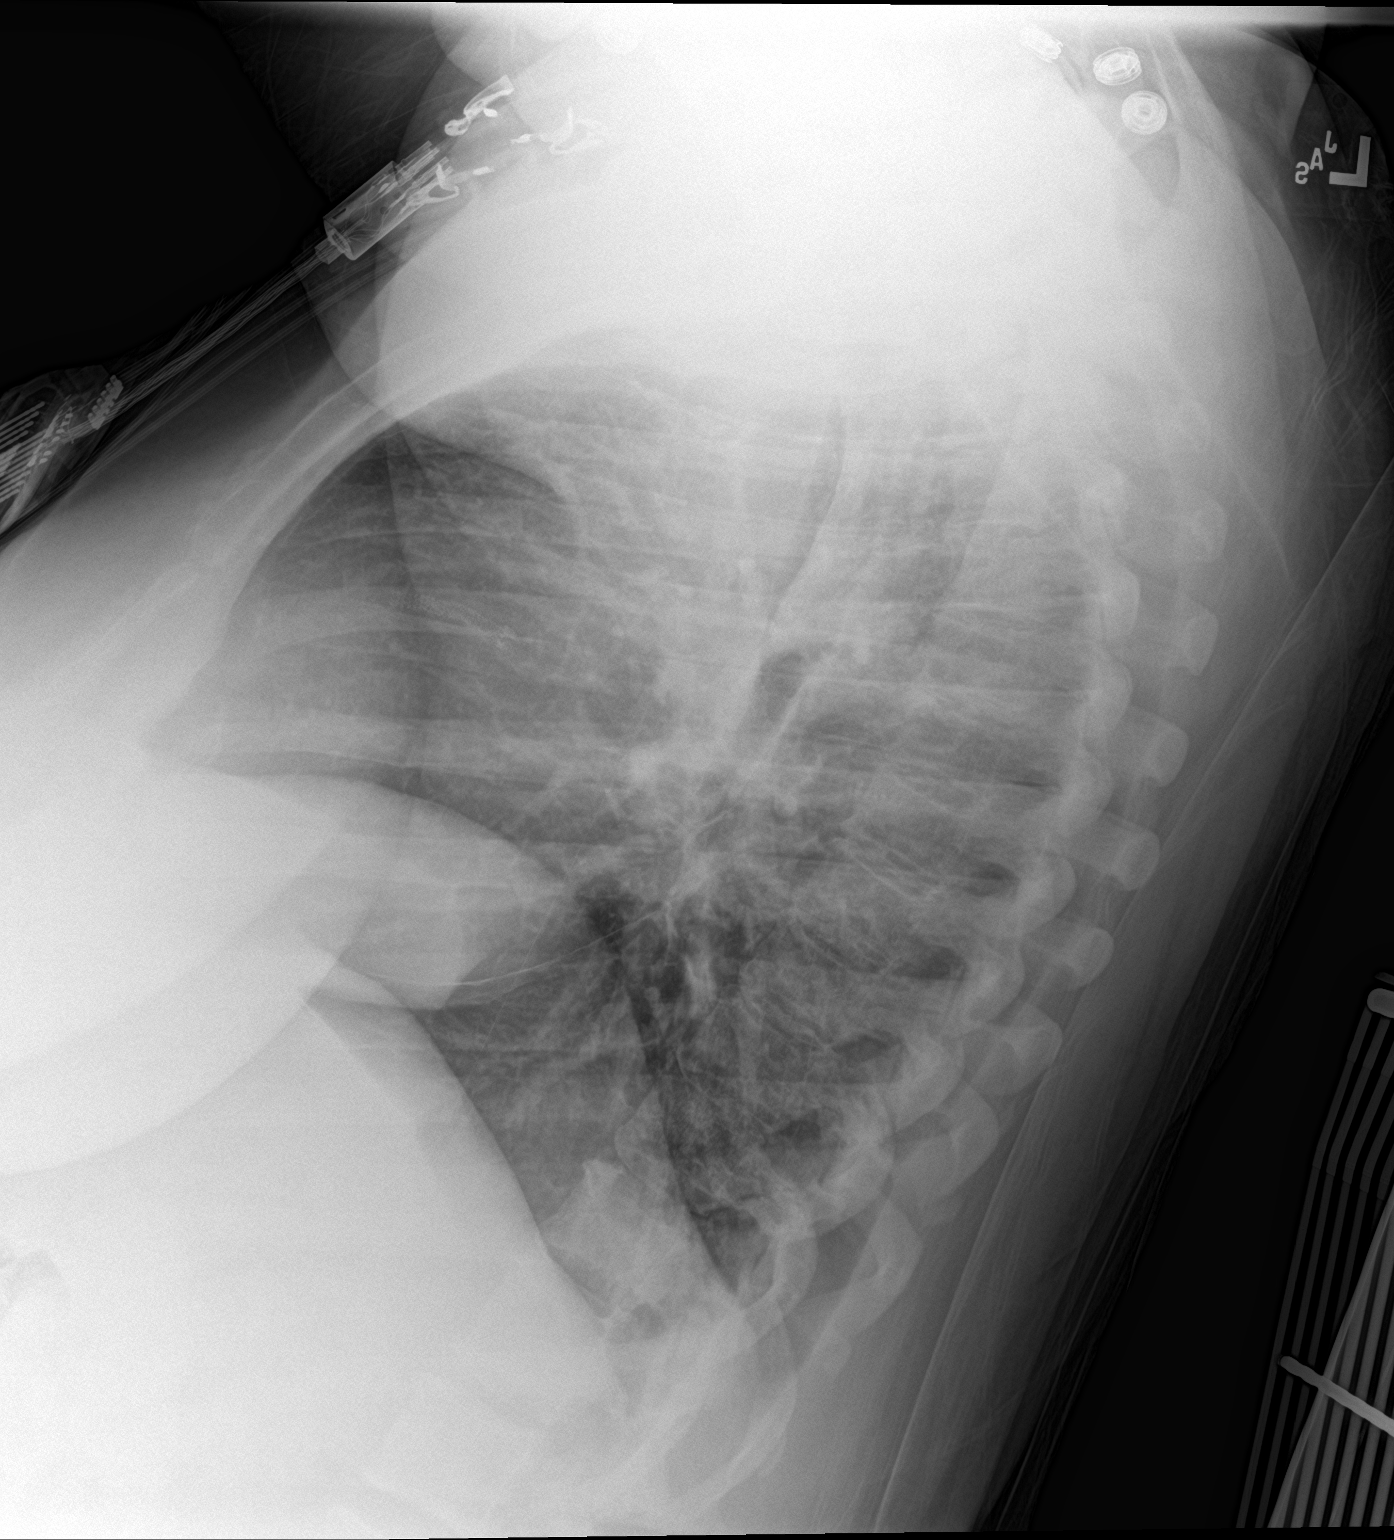

[chest ap]
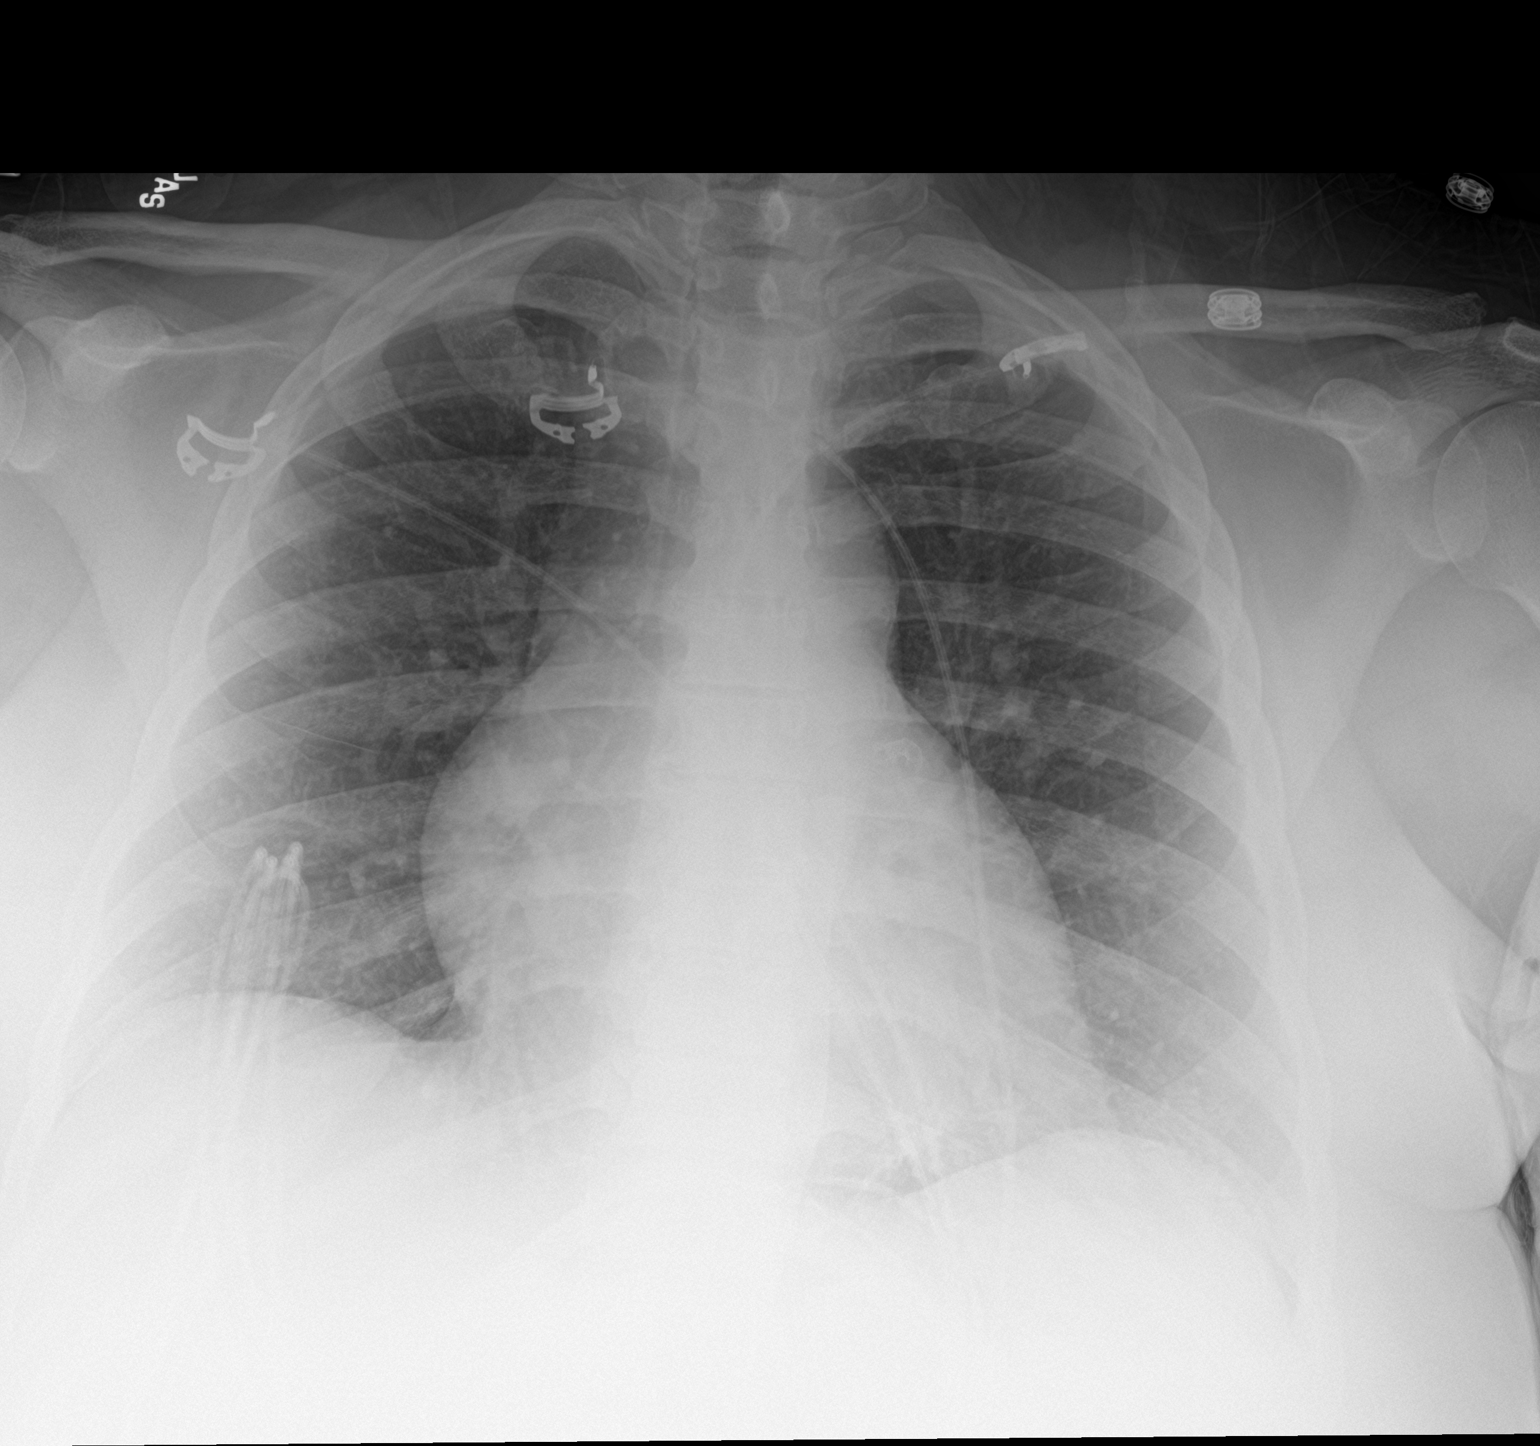

[2 of 2 positions shown; findings below may reference images not displayed]

FINDINGS: Shallow inspiration. Normal heart size and pulmonary vascularity. No
focal airspace disease or consolidation in the lungs. No blunting of
costophrenic angles. No pneumothorax. Mediastinal contours appear
intact. Coronary artery stents.
IMPRESSION: No active cardiopulmonary disease.

## 2019-08-19 DIAGNOSIS — Z992 Dependence on renal dialysis: Secondary | ICD-10-CM | POA: Diagnosis not present

## 2019-08-19 DIAGNOSIS — N2581 Secondary hyperparathyroidism of renal origin: Secondary | ICD-10-CM | POA: Diagnosis not present

## 2019-08-19 DIAGNOSIS — N186 End stage renal disease: Secondary | ICD-10-CM | POA: Diagnosis not present

## 2019-08-19 DIAGNOSIS — D631 Anemia in chronic kidney disease: Secondary | ICD-10-CM | POA: Diagnosis not present

## 2019-08-19 DIAGNOSIS — E1129 Type 2 diabetes mellitus with other diabetic kidney complication: Secondary | ICD-10-CM | POA: Diagnosis not present

## 2019-08-19 DIAGNOSIS — D509 Iron deficiency anemia, unspecified: Secondary | ICD-10-CM | POA: Diagnosis not present

## 2019-08-21 ENCOUNTER — Ambulatory Visit (INDEPENDENT_AMBULATORY_CARE_PROVIDER_SITE_OTHER): Payer: Medicare Other | Admitting: Podiatry

## 2019-08-21 VITALS — Temp 96.7°F

## 2019-08-21 DIAGNOSIS — L600 Ingrowing nail: Secondary | ICD-10-CM

## 2019-08-21 MED ORDER — GENTAMICIN SULFATE 0.1 % EX CREA: 1.0000 "application " | TOPICAL_CREAM | Freq: Two times a day (BID) | CUTANEOUS | 1 refills | Status: DC

## 2019-08-21 MED ORDER — DOXYCYCLINE HYCLATE 100 MG PO TABS: 100.0000 mg | ORAL_TABLET | Freq: Two times a day (BID) | ORAL | 0 refills | Status: DC

## 2019-08-21 NOTE — Patient Instructions (Signed)

## 2019-08-22 ENCOUNTER — Telehealth: Payer: Self-pay | Admitting: *Deleted

## 2019-08-22 NOTE — Telephone Encounter (Signed)
Patient is requesting something for pain, had B/L nail precedures completed 1 day ago, please call medication in to [facility]([address]Please call.

## 2019-08-23 NOTE — Telephone Encounter (Signed)
Pain should be improving. Recommend OTC motrin 800 three times daily and tylenol 1000mg  three times daily.  - Dr. Amalia Hailey

## 2019-08-23 NOTE — Progress Notes (Signed)
Subjective: Patient presents today for evaluation of pain to the medial borders of the bilateral great toes that began three months ago. Patient is concerned for possible ingrown nail. Walking and wearing shoes increases the pain. She has been soaking the toes in Epsom salt for treatment. Patient presents today for further treatment and evaluation.  Past Medical History:  Diagnosis Date  . Anemia   . Aortic stenosis    Echo 8/18: mean 13, peak 28, LVOT/AV mean velocity 0.51  . Arthritis   . Asthma    As a child   . Bronchitis   . CAD (coronary artery disease)    a. 09/2016: 50% Ost 1st Mrg stenosis, 50% 2nd Mrg stenosis, 20% Mid-Cx, 95% Prox LAD, 40% mid-LAD, and 10% dist-LAD stenosis. Staged PCI with DES to Prox-LAD.   [name] Chronic combined systolic and diastolic CHF (congestive heart failure) ([place]) 2011   echo 2/18: EF 55-60, normal wall motion, grade 2 diastolic dysfunction, trivial AI // echo 3/18: Septal and apical HK, EF 45-50, normal wall motion, trivial AI, mild LAE, PASP 38 // echo 8/18: EF 60-65, normal wall motion, grade 1 diastolic dysfunction, calcified aortic valve leaflets, mild aortic stenosis (mean 13, peak 28, LVOT/AV mean velocity 0.51), mild AI, moderate MAC, mild LAE, trivial TR   . Chronic kidney disease    STAGE 4  . Chronic kidney disease on chronic dialysis (HCC)    t, th, sat  . Complication of anesthesia   . Depression   . Diabetes mellitus Dx 1989  . Elevated lipids   . GERD (gastroesophageal reflux disease)   . Gout   . Heart murmur    asymptomatic  . Hepatitis C Dx 2013  . Hypertension Dx 1989  . Infected surgical wound    Lt arm  . Myocardial infarction ([place]) 07/2015  . Obesity   . Pancreatitis 2013  . Pneumonia   . Refusal of blood transfusions as patient is Jehovah's Witness   . Tendinitis   . Tremors of nervous system    LEFT HAND  . Ulcer 2010    Objective:  General: Well developed, nourished, in no acute distress, alert and oriented x3     Dermatology: Skin is warm, dry and supple bilateral. Medial borders of the bilateral great toes appears to be erythematous with evidence of an ingrowing nail. Pain on palpation noted to the border of the nail fold. The remaining nails appear unremarkable at this time. There are no open sores, lesions.  Vascular: Dorsalis Pedis artery and Posterior Tibial artery pedal pulses palpable. No lower extremity edema noted.   Neruologic: Grossly intact via light touch bilateral.  Musculoskeletal: Muscular strength within normal limits in all groups bilateral. Normal range of motion noted to all pedal and ankle joints.   Assesement: #1 Paronychia with ingrowing nail medial borders bilateral great toes  #2 Pain in toe #3 Incurvated nail  Plan of Care:  1. Patient evaluated.  2. Discussed treatment alternatives and plan of care. Explained nail avulsion procedure and post procedure course to patient. 3. Patient opted for permanent partial nail avulsion of the medial borders bilateral great toes.  4. Prior to procedure, local anesthesia infiltration utilized using 3 ml of a 50:50 mixture of 2% plain lidocaine and 0.5% plain marcaine in a normal hallux block fashion and a betadine prep performed.  5. Partial permanent nail avulsion with chemical matrixectomy performed using [id number] applications of phenol followed by alcohol flush.  6. Light dressing applied. 7. Prescription  for Doxycycline 100 mg #20 provided to patient.  8. Prescription for Gentamicin cream provided to patient to use daily with a bandage.  9. Return to clinic in 2 weeks.  Edrick Kins, DPM [facility]  Dr. Edrick Kins, [place]                                        [place], [place] [address]                Office [phone]  Fax [phone]

## 2019-08-24 DIAGNOSIS — N186 End stage renal disease: Secondary | ICD-10-CM | POA: Diagnosis not present

## 2019-08-24 DIAGNOSIS — D631 Anemia in chronic kidney disease: Secondary | ICD-10-CM | POA: Diagnosis not present

## 2019-08-24 DIAGNOSIS — D509 Iron deficiency anemia, unspecified: Secondary | ICD-10-CM | POA: Diagnosis not present

## 2019-08-24 DIAGNOSIS — Z992 Dependence on renal dialysis: Secondary | ICD-10-CM | POA: Diagnosis not present

## 2019-08-24 DIAGNOSIS — E1129 Type 2 diabetes mellitus with other diabetic kidney complication: Secondary | ICD-10-CM | POA: Diagnosis not present

## 2019-08-24 DIAGNOSIS — N2581 Secondary hyperparathyroidism of renal origin: Secondary | ICD-10-CM | POA: Diagnosis not present

## 2019-08-24 NOTE — Telephone Encounter (Signed)
Spoke to patient and advised. She expressed understanding of instructions.  Thanks

## 2019-08-25 DIAGNOSIS — D509 Iron deficiency anemia, unspecified: Secondary | ICD-10-CM | POA: Diagnosis not present

## 2019-08-25 DIAGNOSIS — E1129 Type 2 diabetes mellitus with other diabetic kidney complication: Secondary | ICD-10-CM | POA: Diagnosis not present

## 2019-08-25 DIAGNOSIS — D631 Anemia in chronic kidney disease: Secondary | ICD-10-CM | POA: Diagnosis not present

## 2019-08-25 DIAGNOSIS — N2581 Secondary hyperparathyroidism of renal origin: Secondary | ICD-10-CM | POA: Diagnosis not present

## 2019-08-25 DIAGNOSIS — Z992 Dependence on renal dialysis: Secondary | ICD-10-CM | POA: Diagnosis not present

## 2019-08-25 DIAGNOSIS — N186 End stage renal disease: Secondary | ICD-10-CM | POA: Diagnosis not present

## 2019-08-29 DIAGNOSIS — D509 Iron deficiency anemia, unspecified: Secondary | ICD-10-CM | POA: Diagnosis not present

## 2019-08-29 DIAGNOSIS — N186 End stage renal disease: Secondary | ICD-10-CM | POA: Diagnosis not present

## 2019-08-29 DIAGNOSIS — D631 Anemia in chronic kidney disease: Secondary | ICD-10-CM | POA: Diagnosis not present

## 2019-08-29 DIAGNOSIS — Z992 Dependence on renal dialysis: Secondary | ICD-10-CM | POA: Diagnosis not present

## 2019-08-29 DIAGNOSIS — E1129 Type 2 diabetes mellitus with other diabetic kidney complication: Secondary | ICD-10-CM | POA: Diagnosis not present

## 2019-08-29 DIAGNOSIS — N2581 Secondary hyperparathyroidism of renal origin: Secondary | ICD-10-CM | POA: Diagnosis not present

## 2019-08-31 DIAGNOSIS — D631 Anemia in chronic kidney disease: Secondary | ICD-10-CM | POA: Diagnosis not present

## 2019-08-31 DIAGNOSIS — D509 Iron deficiency anemia, unspecified: Secondary | ICD-10-CM | POA: Diagnosis not present

## 2019-08-31 DIAGNOSIS — N186 End stage renal disease: Secondary | ICD-10-CM | POA: Diagnosis not present

## 2019-08-31 DIAGNOSIS — N2581 Secondary hyperparathyroidism of renal origin: Secondary | ICD-10-CM | POA: Diagnosis not present

## 2019-08-31 DIAGNOSIS — E1129 Type 2 diabetes mellitus with other diabetic kidney complication: Secondary | ICD-10-CM | POA: Diagnosis not present

## 2019-08-31 DIAGNOSIS — Z992 Dependence on renal dialysis: Secondary | ICD-10-CM | POA: Diagnosis not present

## 2019-09-02 DIAGNOSIS — D631 Anemia in chronic kidney disease: Secondary | ICD-10-CM | POA: Diagnosis not present

## 2019-09-02 DIAGNOSIS — E1129 Type 2 diabetes mellitus with other diabetic kidney complication: Secondary | ICD-10-CM | POA: Diagnosis not present

## 2019-09-02 DIAGNOSIS — N2581 Secondary hyperparathyroidism of renal origin: Secondary | ICD-10-CM | POA: Diagnosis not present

## 2019-09-02 DIAGNOSIS — N186 End stage renal disease: Secondary | ICD-10-CM | POA: Diagnosis not present

## 2019-09-02 DIAGNOSIS — Z992 Dependence on renal dialysis: Secondary | ICD-10-CM | POA: Diagnosis not present

## 2019-09-02 DIAGNOSIS — D509 Iron deficiency anemia, unspecified: Secondary | ICD-10-CM | POA: Diagnosis not present

## 2019-09-04 ENCOUNTER — Ambulatory Visit: Payer: Medicare Other | Admitting: Podiatry

## 2019-09-05 ENCOUNTER — Telehealth: Payer: Self-pay | Admitting: Physician Assistant

## 2019-09-05 ENCOUNTER — Telehealth (INDEPENDENT_AMBULATORY_CARE_PROVIDER_SITE_OTHER): Payer: Self-pay | Admitting: Vascular Surgery

## 2019-09-05 DIAGNOSIS — Z992 Dependence on renal dialysis: Secondary | ICD-10-CM | POA: Diagnosis not present

## 2019-09-05 DIAGNOSIS — E1129 Type 2 diabetes mellitus with other diabetic kidney complication: Secondary | ICD-10-CM | POA: Diagnosis not present

## 2019-09-05 DIAGNOSIS — N2581 Secondary hyperparathyroidism of renal origin: Secondary | ICD-10-CM | POA: Diagnosis not present

## 2019-09-05 DIAGNOSIS — D631 Anemia in chronic kidney disease: Secondary | ICD-10-CM | POA: Diagnosis not present

## 2019-09-05 DIAGNOSIS — N186 End stage renal disease: Secondary | ICD-10-CM | POA: Diagnosis not present

## 2019-09-05 DIAGNOSIS — D509 Iron deficiency anemia, unspecified: Secondary | ICD-10-CM | POA: Diagnosis not present

## 2019-09-05 NOTE — Telephone Encounter (Signed)
Patient was last seen on 05/22/2019 by Dr. Delana Meyer and had an agio on 06/02/2019. Please advise.

## 2019-09-05 NOTE — Telephone Encounter (Signed)
NA, mailbox is full. 

## 2019-09-05 NOTE — Telephone Encounter (Signed)
NA, will try again later.

## 2019-09-05 NOTE — Telephone Encounter (Signed)
This is somewhat strange because her access is on her left arm and typically her right arm shouldn't hurt.  However we can do a right upper extremity arterial duplex to see if there is possible disease in that arm.  She should still follow up with her nephrologist as well.  She can see GS or me.

## 2019-09-05 NOTE — Telephone Encounter (Signed)
Copied from [place] [phone]. Topic: General - Other >> Sep 05, 2019 11:58 AM Keene Breath wrote: Reason for CRM: Patient called to inform the doctor that after her dialysis treatment,  she had, her right arm is painful and she cannot use the arm.  She stated that she is going to contact the dialysis office as well but she would like to speak with the nurse or doctor as well.  CB# [phone]

## 2019-09-06 NOTE — Telephone Encounter (Signed)
Patient will be scheduled for a u/s and office visit. See notes below.

## 2019-09-06 NOTE — Telephone Encounter (Signed)
Spoke with patient and she states that her dialysis office wanted her to be seen by her vascular doctor and she has appointment schedule for 09/13/2019 with the vascular doctor.FYI

## 2019-09-07 DIAGNOSIS — E1129 Type 2 diabetes mellitus with other diabetic kidney complication: Secondary | ICD-10-CM | POA: Diagnosis not present

## 2019-09-07 DIAGNOSIS — N186 End stage renal disease: Secondary | ICD-10-CM | POA: Diagnosis not present

## 2019-09-07 DIAGNOSIS — D631 Anemia in chronic kidney disease: Secondary | ICD-10-CM | POA: Diagnosis not present

## 2019-09-07 DIAGNOSIS — D509 Iron deficiency anemia, unspecified: Secondary | ICD-10-CM | POA: Diagnosis not present

## 2019-09-07 DIAGNOSIS — N2581 Secondary hyperparathyroidism of renal origin: Secondary | ICD-10-CM | POA: Diagnosis not present

## 2019-09-07 DIAGNOSIS — Z992 Dependence on renal dialysis: Secondary | ICD-10-CM | POA: Diagnosis not present

## 2019-09-08 ENCOUNTER — Other Ambulatory Visit: Payer: Self-pay | Admitting: Physician Assistant

## 2019-09-08 DIAGNOSIS — Z794 Long term (current) use of insulin: Secondary | ICD-10-CM

## 2019-09-08 DIAGNOSIS — N184 Chronic kidney disease, stage 4 (severe): Secondary | ICD-10-CM

## 2019-09-08 DIAGNOSIS — E1122 Type 2 diabetes mellitus with diabetic chronic kidney disease: Secondary | ICD-10-CM

## 2019-09-09 DIAGNOSIS — Z992 Dependence on renal dialysis: Secondary | ICD-10-CM | POA: Diagnosis not present

## 2019-09-09 DIAGNOSIS — N2581 Secondary hyperparathyroidism of renal origin: Secondary | ICD-10-CM | POA: Diagnosis not present

## 2019-09-09 DIAGNOSIS — N186 End stage renal disease: Secondary | ICD-10-CM | POA: Diagnosis not present

## 2019-09-09 DIAGNOSIS — D509 Iron deficiency anemia, unspecified: Secondary | ICD-10-CM | POA: Diagnosis not present

## 2019-09-09 DIAGNOSIS — E1129 Type 2 diabetes mellitus with other diabetic kidney complication: Secondary | ICD-10-CM | POA: Diagnosis not present

## 2019-09-09 DIAGNOSIS — D631 Anemia in chronic kidney disease: Secondary | ICD-10-CM | POA: Diagnosis not present

## 2019-09-11 DIAGNOSIS — N186 End stage renal disease: Secondary | ICD-10-CM | POA: Diagnosis not present

## 2019-09-11 DIAGNOSIS — Z992 Dependence on renal dialysis: Secondary | ICD-10-CM | POA: Diagnosis not present

## 2019-09-11 DIAGNOSIS — E1122 Type 2 diabetes mellitus with diabetic chronic kidney disease: Secondary | ICD-10-CM | POA: Diagnosis not present

## 2019-09-12 ENCOUNTER — Other Ambulatory Visit (INDEPENDENT_AMBULATORY_CARE_PROVIDER_SITE_OTHER): Payer: Self-pay | Admitting: Nurse Practitioner

## 2019-09-12 DIAGNOSIS — D509 Iron deficiency anemia, unspecified: Secondary | ICD-10-CM | POA: Diagnosis not present

## 2019-09-12 DIAGNOSIS — D631 Anemia in chronic kidney disease: Secondary | ICD-10-CM | POA: Diagnosis not present

## 2019-09-12 DIAGNOSIS — Z992 Dependence on renal dialysis: Secondary | ICD-10-CM | POA: Diagnosis not present

## 2019-09-12 DIAGNOSIS — N186 End stage renal disease: Secondary | ICD-10-CM | POA: Diagnosis not present

## 2019-09-12 DIAGNOSIS — N2581 Secondary hyperparathyroidism of renal origin: Secondary | ICD-10-CM | POA: Diagnosis not present

## 2019-09-12 DIAGNOSIS — M79601 Pain in right arm: Secondary | ICD-10-CM

## 2019-09-13 ENCOUNTER — Other Ambulatory Visit (INDEPENDENT_AMBULATORY_CARE_PROVIDER_SITE_OTHER): Payer: Self-pay | Admitting: Nurse Practitioner

## 2019-09-13 ENCOUNTER — Ambulatory Visit (INDEPENDENT_AMBULATORY_CARE_PROVIDER_SITE_OTHER): Payer: Medicare Other

## 2019-09-13 ENCOUNTER — Encounter (INDEPENDENT_AMBULATORY_CARE_PROVIDER_SITE_OTHER): Payer: Self-pay | Admitting: Nurse Practitioner

## 2019-09-13 ENCOUNTER — Ambulatory Visit (INDEPENDENT_AMBULATORY_CARE_PROVIDER_SITE_OTHER): Payer: Medicare Other | Admitting: Nurse Practitioner

## 2019-09-13 ENCOUNTER — Other Ambulatory Visit: Payer: Self-pay

## 2019-09-13 VITALS — BP 143/81 | HR 91 | Resp 16 | Ht 68.0 in | Wt 287.0 lb

## 2019-09-13 DIAGNOSIS — N186 End stage renal disease: Secondary | ICD-10-CM | POA: Diagnosis not present

## 2019-09-13 DIAGNOSIS — M79601 Pain in right arm: Secondary | ICD-10-CM

## 2019-09-13 DIAGNOSIS — M7989 Other specified soft tissue disorders: Secondary | ICD-10-CM

## 2019-09-13 DIAGNOSIS — Z992 Dependence on renal dialysis: Secondary | ICD-10-CM | POA: Diagnosis not present

## 2019-09-13 DIAGNOSIS — I10 Essential (primary) hypertension: Secondary | ICD-10-CM

## 2019-09-14 ENCOUNTER — Other Ambulatory Visit: Payer: Self-pay | Admitting: Physician Assistant

## 2019-09-14 ENCOUNTER — Telehealth (INDEPENDENT_AMBULATORY_CARE_PROVIDER_SITE_OTHER): Payer: Self-pay

## 2019-09-14 DIAGNOSIS — R197 Diarrhea, unspecified: Secondary | ICD-10-CM

## 2019-09-14 NOTE — Telephone Encounter (Signed)
Spoke with the patient and she is now on the schedule with Dr. Delana Meyer for a RUE angio on 09/19/19 with a 10:00 am arrival time to the [facility]. Patient will do covid testing on 09/15/19 between 12:30-2:30 pm at the [facility]. Pre-procedure instructions were discussed.

## 2019-09-15 ENCOUNTER — Other Ambulatory Visit
Admission: RE | Admit: 2019-09-15 | Discharge: 2019-09-15 | Disposition: A | Discharge status: Home / Self Care | Payer: Medicare Other | Source: Ambulatory Visit | Attending: Vascular Surgery | Admitting: Vascular Surgery

## 2019-09-15 ENCOUNTER — Encounter (INDEPENDENT_AMBULATORY_CARE_PROVIDER_SITE_OTHER): Payer: Self-pay | Admitting: Nurse Practitioner

## 2019-09-15 ENCOUNTER — Other Ambulatory Visit: Payer: Self-pay

## 2019-09-15 DIAGNOSIS — Z01812 Encounter for preprocedural laboratory examination: Secondary | ICD-10-CM | POA: Diagnosis not present

## 2019-09-15 DIAGNOSIS — Z20822 Contact with and (suspected) exposure to covid-19: Secondary | ICD-10-CM | POA: Diagnosis not present

## 2019-09-15 NOTE — Progress Notes (Signed)
SUBJECTIVE:  Patient ID: [name], female    DOB: 07/21/1955, 64 y.o.   MRN: [id number] Chief Complaint  Patient presents with  . Follow-up    U/S Follow up    HPI  [name] is a 64 y.o. female presents today due to complaints of right upper extremity pain and wrist pain.  The patient contacted our office on 09/05/2019 to inform us that her right upper extremity was painful after dialysis and she was unable to use her arm.  She was advised by her PCP to contact our office.  Today, the patient states that the pain in her upper extremity started following her PermCath removal.  The PermCath removal is not done by our office, it was done by an outside facility.  However the patient states that she has a sharp shooting pain going from her shoulder down to her arm.  She also states that there is extreme sensitivity to touch at her right wrist.  This is over the area of the radial artery.  The area is so sensitive that palpation is barely tolerated.  The patient also feels as if her hand is somewhat swollen.  She denies any fever, chills, nausea, vomiting or diarrhea.  She denies any issues with dialysis currently.  Today the patient underwent a right upper extremity arterial duplex which revealed triphasic waveforms throughout the right upper extremity.  The flow appears to be normal until it reaches the level of the wrist.  The radial artery declines from a velocity of 92 approximately 17.  This is at the area that is a point of particular concern for the patient.  There is no focal stenosis or occlusion seen.  Today the patient also had a right upper extremity venous duplex done which revealed no evidence of DVT or superficial venous thrombosis.  The patient also has evidence of normal venous flow in her right upper extremity.  Past Medical History:  Diagnosis Date  . Anemia   . Aortic stenosis    Echo 8/18: mean 13, peak 28, LVOT/AV mean velocity 0.51  . Arthritis   . Asthma    As a  child   . Bronchitis   . CAD (coronary artery disease)    a. 09/2016: 50% Ost 1st Mrg stenosis, 50% 2nd Mrg stenosis, 20% Mid-Cx, 95% Prox LAD, 40% mid-LAD, and 10% dist-LAD stenosis. Staged PCI with DES to Prox-LAD.   [name] Chronic combined systolic and diastolic CHF (congestive heart failure) ([place]) 2011   echo 2/18: EF 55-60, normal wall motion, grade 2 diastolic dysfunction, trivial AI // echo 3/18: Septal and apical HK, EF 45-50, normal wall motion, trivial AI, mild LAE, PASP 38 // echo 8/18: EF 60-65, normal wall motion, grade 1 diastolic dysfunction, calcified aortic valve leaflets, mild aortic stenosis (mean 13, peak 28, LVOT/AV mean velocity 0.51), mild AI, moderate MAC, mild LAE, trivial TR   . Chronic kidney disease    STAGE 4  . Chronic kidney disease on chronic dialysis (HCC)    t, th, sat  . Complication of anesthesia   . Depression   . Diabetes mellitus Dx 1989  . Elevated lipids   . GERD (gastroesophageal reflux disease)   . Gout   . Heart murmur    asymptomatic  . Hepatitis C Dx 2013  . Hypertension Dx 1989  . Infected surgical wound    Lt arm  . Myocardial infarction ([place]) 07/2015  . Obesity   . Pancreatitis 2013  . Pneumonia   .  Refusal of blood transfusions as patient is Jehovah's Witness   . Tendinitis   . Tremors of nervous system    LEFT HAND  . Ulcer 2010    Past Surgical History:  Procedure Laterality Date  . A/V FISTULAGRAM Left 04/11/2019   Procedure: A/V FISTULAGRAM;  Surgeon: Katha Cabal, MD;  Location: [facility];  Service: Cardiovascular;  Laterality: Left;  . A/V FISTULAGRAM Left 06/02/2019   Procedure: A/V FISTULAGRAM;  Surgeon: Katha Cabal, MD;  Location: [facility];  Service: Cardiovascular;  Laterality: Left;  . APPLICATION OF WOUND VAC Left 08/14/2017   Procedure: APPLICATION OF WOUND VAC Exchange;  Surgeon: Robert Bellow, MD;  Location: [facility];  Service: General;  Laterality: Left;  . APPLICATION OF WOUND  VAC Left 12/21/2018   Procedure: APPLICATION OF WOUND VAC;  Surgeon: Katha Cabal, MD;  Location: [facility];  Service: Vascular;  Laterality: Left;  . AV FISTULA PLACEMENT Left 08/19/2018   Procedure: ARTERIOVENOUS (AV) FISTULA CREATION ( BRACHIOBASILIC );  Surgeon: Katha Cabal, MD;  Location: [facility];  Service: Vascular;  Laterality: Left;  . BASCILIC VEIN TRANSPOSITION Left 11/18/2018   Procedure: BASCILIC VEIN TRANSPOSITION;  Surgeon: Katha Cabal, MD;  Location: [facility];  Service: Vascular;  Laterality: Left;  . CHOLECYSTECTOMY    . COLONOSCOPY WITH PROPOFOL N/A 02/03/2018   Procedure: COLONOSCOPY WITH PROPOFOL;  Surgeon: Lin Landsman, MD;  Location: [facility];  Service: Gastroenterology;  Laterality: N/A;  . CORONARY ANGIOPLASTY  07/2015   STENT  . CORONARY STENT INTERVENTION N/A 09/18/2016   Procedure: Coronary Stent Intervention;  Surgeon: Troy Sine, MD;  Location: [facility];  Service: Cardiovascular;  Laterality: N/A;  . DIALYSIS/PERMA CATHETER INSERTION N/A 05/10/2018   Procedure: DIALYSIS/PERMA CATHETER INSERTION;  Surgeon: Katha Cabal, MD;  Location: [facility];  Service: Cardiovascular;  Laterality: N/A;  . DRESSING CHANGE UNDER ANESTHESIA Left 08/15/2017   Procedure: exploration of wound for bleeding;  Surgeon: Robert Bellow, MD;  Location: [facility];  Service: General;  Laterality: Left;  . ESOPHAGOGASTRODUODENOSCOPY (EGD) WITH PROPOFOL N/A 02/03/2018   Procedure: ESOPHAGOGASTRODUODENOSCOPY (EGD) WITH PROPOFOL;  Surgeon: Lin Landsman, MD;  Location: [facility];  Service: Gastroenterology;  Laterality: N/A;  . EYE SURGERY  11/17/2018  . INCISION AND DRAINAGE ABSCESS Left 08/12/2017   Procedure: INCISION AND DRAINAGE ABSCESS;  Surgeon: Robert Bellow, MD;  Location: [facility];  Service: General;  Laterality: Left;  . KNEE ARTHROSCOPY    . LEFT HEART CATH N/A 09/18/2016   Procedure: Left Heart Cath;  Surgeon: Troy Sine, MD;  Location: [facility];  Service: Cardiovascular;  Laterality: N/A;  . LEFT HEART CATH AND CORONARY ANGIOGRAPHY N/A 09/16/2016   Procedure: Left Heart Cath and Coronary Angiography;  Surgeon: Burnell Blanks, MD;  Location: [facility];  Service: Cardiovascular;  Laterality: N/A;  . LEFT HEART CATH AND CORONARY ANGIOGRAPHY N/A 04/29/2017   Procedure: LEFT HEART CATH AND CORONARY ANGIOGRAPHY;  Surgeon: Nelva Bush, MD;  Location: [facility];  Service: Cardiovascular;  Laterality: N/A;  . LOWER EXTREMITY ANGIOGRAPHY Right 03/08/2018   Procedure: LOWER EXTREMITY ANGIOGRAPHY;  Surgeon: Katha Cabal, MD;  Location: [facility];  Service: Cardiovascular;  Laterality: Right;  . TUBAL LIGATION    . TUBAL LIGATION    . WOUND DEBRIDEMENT Left 12/21/2018   Procedure: DEBRIDEMENT WOUND;  Surgeon: Katha Cabal, MD;  Location: [facility];  Service: Vascular;  Laterality: Left;  . WOUND DEBRIDEMENT Left 12/30/2018   Procedure: DEBRIDEMENT WOUND WITH VAC PLACEMENT (LEFT UPPER EXTREMITY);  Surgeon: Katha Cabal, MD;  Location: [facility];  Service: Vascular;  Laterality: Left;    Social History   Socioeconomic History  . Marital status: Divorced    Spouse name: Not on file  . Number of children: Not on file  . Years of education: Not on file  . Highest education level: Not on file  Occupational History  . Not on file  Tobacco Use  . Smoking status: Former Smoker    Packs/day: 0.25    Years: 6.00    Pack years: 1.50    Types: Cigarettes    Quit date: 10/25/1980    Years since quitting: 38.9  . Smokeless tobacco: Never Used  Substance and Sexual Activity  . Alcohol use: Yes    Comment: rarely  . Drug use: Yes    Types: Marijuana    Comment: Occasional marijuana use 08/21/2016 "no crack-clean since 05/1998"  . Sexual activity: Not on file    Comment: Not asked  Other Topics Concern  . Not on file  Social History Narrative  . Not  on file   Social Determinants of Health   Financial Resource Strain:   . Difficulty of Paying Living Expenses: Not on file  Food Insecurity:   . Worried About Charity fundraiser in the Last Year: Not on file  . Ran Out of Food in the Last Year: Not on file  Transportation Needs:   . Lack of Transportation (Medical): Not on file  . Lack of Transportation (Non-Medical): Not on file  Physical Activity:   . Days of Exercise per Week: Not on file  . Minutes of Exercise per Session: Not on file  Stress:   . Feeling of Stress : Not on file  Social Connections:   . Frequency of Communication with Friends and Family: Not on file  . Frequency of Social Gatherings with Friends and Family: Not on file  . Attends Religious Services: Not on file  . Active Member of Clubs or Organizations: Not on file  . Attends Archivist Meetings: Not on file  . Marital Status: Not on file  Intimate Partner Violence:   . Fear of Current or Ex-Partner: Not on file  . Emotionally Abused: Not on file  . Physically Abused: Not on file  . Sexually Abused: Not on file    Family History  Problem Relation Age of Onset  . Colon cancer Mother   . Heart attack Other   . Heart attack Maternal Grandmother   . Hypertension Sister   . Hypertension Brother   . Diabetes Paternal Grandmother   . Breast cancer Neg Hx     Allergies  Allergen Reactions  . Shellfish Allergy Anaphylaxis and Swelling  . Diazepam Other (See Comments)    "felt like out of body experience"     Review of Systems   Review of Systems: Negative Unless Checked Constitutional: _0 Weight loss  _1 Fever  _2 Chills Cardiac: _3 Chest pain   _4  Atrial Fibrillation  _5 Palpitations   _6 Shortness of breath when laying flat   _7 Shortness of breath with exertion. _8 Shortness of breath at rest Vascular:  _9 Pain in legs with walking   _10 Pain in legs with standing _11 Pain in legs when laying flat   _12 Claudication    _13 Pain in feet when laying flat     _14 History of DVT   _15   Phlebitis   _0 Swelling in legs   _1 Varicose veins   _2 Non-healing ulcers Pulmonary:   _3 Uses home oxygen   _4 Productive cough   _5 Hemoptysis   _6 Wheeze  _7 COPD   _8 Asthma Neurologic:  _9 Dizziness   _10 Seizures  _11 Blackouts _12 History of stroke   _13 History of TIA  _14 Aphasia   _15 Temporary Blindness   _16 Weakness or numbness in arm   _17 Weakness or numbness in leg Musculoskeletal:   _18 Joint swelling   _19 Joint pain   _20 Low back pain  _21  History of Knee Replacement _22 Arthritis _23 back Surgeries  _24  Spinal Stenosis    Hematologic:  _25 Easy bruising  _26 Easy bleeding   _27 Hypercoagulable state   _28 Anemic Gastrointestinal:  _29 Diarrhea   _30 Vomiting  _31 Gastroesophageal reflux/heartburn   _32 Difficulty swallowing. _33 Abdominal pain Genitourinary:  _34 Chronic kidney disease   _35 Difficult urination  _36 Anuric   _37 Blood in urine _38 Frequent urination  _39 Burning with urination   _40 Hematuria Skin:  _41 Rashes   _42 Ulcers _43 Wounds Psychological:  _44 History of anxiety   _45  History of major depression  _46  Memory Difficulties      OBJECTIVE:   Physical Exam  BP (!) 143/81   Pulse 91   Resp 16   Ht _47  (1.727 m)   Wt 287 lb (130.2 kg)   BMI 43.64 kg/m   Gen: WD/WN, NAD Head: [place]/AT, No temporalis wasting.  Ear/Nose/Throat: Hearing grossly intact, nares w/o erythema or drainage Eyes: PER, EOMI, sclera nonicteric.  Neck: Supple, no masses.  No JVD.  Pulmonary:  Good air movement, no use of accessory muscles.  Cardiac: RRR Vascular:  Difficult to palpate right radial artery due to patient's pain response Vessel Right Left  Radial Palpable Palpable   Gastrointestinal: soft, non-distended. No guarding/no peritoneal signs.  Musculoskeletal: M/S 5/5 throughout.  No deformity or atrophy.  Neurologic: Pain and light touch intact in extremities.  Symmetrical.  Speech is fluent. Motor exam as listed above. Psychiatric: Judgment intact, Mood & affect appropriate for pt's clinical  situation. Dermatologic: No Venous rashes. No Ulcers Noted.  No changes consistent with cellulitis. Lymph : No Cervical lymphadenopathy, no lichenification or skin changes of chronic lymphedema.       ASSESSMENT AND PLAN:  1. Right arm pain Based upon the noninvasive studies today it is not likely that this pain is related to a vascular cause.  However there is slightly dampened flow near the area of concern in the radial artery.  The patient also states that this pain began following her PermCath removal.  Therefore we will perform a right upper extremity angiogram to ensure that there is no arterial occlusion.  I discussed the procedure with the patient in addition to the risk, alternatives and benefits and the patient agrees to proceed.  I also discussed with the patient that it is a possibility that this will be purely diagnostic and no intervention will be done depending on what is found.  If in fact there is no evidence of vascular compromise found, this is more likely related to an orthopedic cause.  It could be related to cervical disc disease, arthritis, or a different source.  If the angiogram proves to be unrevealing, we will place a referral for the patient to the orthopedic specialist.  2. ESRD on dialysis [facility]) Currently no issues with dialysis or dialysis access.  3. Essential hypertension Patient's blood pressure well controlled, on appropriate medications.  No changes needed.   Current Outpatient Medications on File Prior to Visit  Medication Sig Dispense Refill  . Accu-Chek FastClix Lancets MISC  USE AS DIRECTED TO CHECK BLOOD SUGAR THREE TIMES DAILY AND AT BEDTIME 102 each 0  . albuterol (VENTOLIN HFA) 108 (90 Base) MCG/ACT inhaler INHALE 2 PUFFS BY MOUTH EVERY 4 HOURS AS NEEDED FOR SHORTNESS OF BREATH 18 g 5  . allopurinol (ZYLOPRIM) 100 MG tablet TAKE 1 TABLET BY MOUTH ONCE DAILY. 28 tablet 5  . aspirin EC 81 MG EC tablet Take 1 tablet (81 mg total) by mouth daily.    [name]  atorvastatin (LIPITOR) 80 MG tablet TAKE 1 TABLET BY MOUTH ONCE DAILY AT 6PM 28 tablet 3  . AURYXIA 1 GM 210 MG(Fe) tablet Take 420 mg by mouth 2 (two) times daily with a meal.     . carvedilol (COREG) 25 MG tablet TAKE (1) TABLET BY MOUTH TWICE A DAY WITH MEALS (BREAKFAST AND SUPPER) 56 tablet 3  . cephALEXin (KEFLEX) 500 MG capsule Take 1 capsule (500 mg total) by mouth 2 (two) times daily. 14 capsule 0  . Continuous Blood Gluc Receiver (FREESTYLE LIBRE 14 DAY READER) DEVI 1 each by Does not apply route 4 (four) times daily -  before meals and at bedtime. 1 each 0  . Continuous Blood Gluc Sensor (FREESTYLE LIBRE 14 DAY SENSOR) MISC 1 each by Does not apply route every 14 (fourteen) days. Check blood sugar ACHS 6 each 4  . cyclobenzaprine (FLEXERIL) 5 MG tablet Take 1 tablet (5 mg total) by mouth 3 (three) times daily as needed for muscle spasms. 30 tablet 5  . docusate sodium (COLACE) 100 MG capsule Take 1 capsule (100 mg total) by mouth 2 (two) times daily as needed for mild constipation. 30 capsule 0  . doxycycline (VIBRA-TABS) 100 MG tablet Take 1 tablet (100 mg total) by mouth 2 (two) times daily. 20 tablet 0  . fluticasone (FLONASE) 50 MCG/ACT nasal spray Place 2 sprays into both nostrils daily as needed for allergies or rhinitis. 48 g 1  . gentamicin cream (GARAMYCIN) 0.1 % Apply 1 application topically 2 (two) times daily. 15 g 1  . hydrALAZINE (APRESOLINE) 100 MG tablet TAKE (1) TABLET BY MOUTH THREE TIMES DAILY 84 tablet 11  . hydrOXYzine (ATARAX/VISTARIL) 25 MG tablet Take 1 tablet (25 mg total) by mouth every 6 (six) hours as needed for itching. 120 tablet 10  . LEVEMIR FLEXTOUCH 100 UNIT/ML Pen INJECT 60 UNITS UNDER THE SKIN TWICE DAILY 15 mL 3  . lidocaine (LIDODERM) 5 % Place 1 patch onto the skin every 12 (twelve) hours. Remove & Discard patch within 12 hours or as directed by MD 10 patch 0  . lidocaine-prilocaine (EMLA) cream Apply 1 application topically as needed. 30 g 11  .  liraglutide (VICTOZA) 18 MG/3ML SOPN Start with 0.36m daily and increase by 0.662mper week until max dose of 1.8 mg dose achieved. 9 mL 5  . loratadine (CLARITIN) 10 MG tablet TAKE 1 TABLET BY MOUTH ONCE DAILY. 28 tablet 11  . methylPREDNISolone (MEDROL) 4 MG TBPK tablet 6 day taper; take as directed on package instructions 21 tablet 0  . mometasone (ELOCON) 0.1 % cream APPLY AS DIRECTED TO AFFECTED AREA ONCE DAILY. 45 g 10  . mometasone-formoterol (DULERA) 200-5 MCG/ACT AERO Inhale 2 puffs into the lungs 2 (two) times daily. 13 g 5  . montelukast (SINGULAIR) 10 MG tablet TAKE ONE TABLET BY MOUTH AT BEDTIME. 28 tablet 11  . nitroGLYCERIN (NITROSTAT) 0.4 MG SL tablet Place 1 tablet (0.4 mg total) under the tongue every 5 (five) minutes x 3  doses as needed for chest pain. 25 tablet 12  . NOVOLOG FLEXPEN 100 UNIT/ML FlexPen USE 25 UNITS UNDER THE SKIN THREE TIMES DAILY WITH MEALS 15 mL 10  . nystatin cream (MYCOSTATIN) Apply 1 application topically 2 (two) times daily. 30 g 0  . omeprazole (PRILOSEC) 20 MG capsule TAKE (1) CAPSULE BY MOUTH ONCE DAILY. 28 capsule 11  . pregabalin (LYRICA) 150 MG capsule Take 1 capsule (150 mg total) by mouth 2 (two) times daily. 60 capsule 5  . SENNA PLUS 8.6-50 MG tablet TAKE ONE TABLET BY MOUTH AT BEDTIME. 28 tablet 11  . silver sulfADIAZINE (SILVADENE) 1 % cream Apply 1 application topically daily. Apply to right first toe 50 g 0  . torsemide (DEMADEX) 20 MG tablet TAKE (2) TABLETS BY MOUTH TWICE DAILY. 112 tablet 11  . traMADol (ULTRAM) 50 MG tablet TAKE (1) OR (2) TABLETS EVERY SIX HOURS AS NEEDED FOR PAIN. -MAY MAKEDROWSY- 40 tablet 0  . triamcinolone cream (KENALOG) 0.1 % APPLY TO AFFECTED AREA TWICE DAILY 80 g 0  . ULTICARE SHORT PEN NEEDLES 31G X 8 MM MISC USE THREE TIMES DAILY WITH INSULIN 100 each 5  . valACYclovir (VALTREX) 500 MG tablet TAKE (1) TABLET BY MOUTH EVERY OTHER DAY. 14 tablet 10  . Vitamin D, Ergocalciferol, (DRISDOL) 1.25 MG (50000 UT) CAPS  capsule TAKE 1 CAPSULE BY MOUTH ONCE A MONTH 1 capsule 5  . [DISCONTINUED] gabapentin (NEURONTIN) 100 MG capsule Take 1 capsule (100 mg total) by mouth 3 (three) times daily. 90 capsule 1   No current facility-administered medications on file prior to visit.    There are no Patient Instructions on file for this visit. No follow-ups on file.   Kris Hartmann, NP  This note was completed with Sales executive.  Any errors are purely unintentional.

## 2019-09-16 DIAGNOSIS — D631 Anemia in chronic kidney disease: Secondary | ICD-10-CM | POA: Diagnosis not present

## 2019-09-16 DIAGNOSIS — N2581 Secondary hyperparathyroidism of renal origin: Secondary | ICD-10-CM | POA: Diagnosis not present

## 2019-09-16 DIAGNOSIS — Z992 Dependence on renal dialysis: Secondary | ICD-10-CM | POA: Diagnosis not present

## 2019-09-16 DIAGNOSIS — D509 Iron deficiency anemia, unspecified: Secondary | ICD-10-CM | POA: Diagnosis not present

## 2019-09-16 DIAGNOSIS — N186 End stage renal disease: Secondary | ICD-10-CM | POA: Diagnosis not present

## 2019-09-16 LAB — SARS CORONAVIRUS 2 (TAT 6-24 HRS): SARS Coronavirus 2: NEGATIVE

## 2019-09-18 ENCOUNTER — Other Ambulatory Visit (INDEPENDENT_AMBULATORY_CARE_PROVIDER_SITE_OTHER): Payer: Self-pay | Admitting: Nurse Practitioner

## 2019-09-19 ENCOUNTER — Other Ambulatory Visit: Payer: Self-pay

## 2019-09-19 ENCOUNTER — Ambulatory Visit
Admission: RE | Admit: 2019-09-19 | Discharge: 2019-09-19 | Disposition: A | Discharge status: Home / Self Care | Payer: Medicare Other | Attending: Vascular Surgery | Admitting: Vascular Surgery

## 2019-09-19 ENCOUNTER — Encounter
Admission: RE | Disposition: A | Discharge status: Home / Self Care | Payer: Self-pay | Source: Home / Self Care | Attending: Vascular Surgery

## 2019-09-19 ENCOUNTER — Encounter: Payer: Self-pay | Admitting: Vascular Surgery

## 2019-09-19 DIAGNOSIS — I2510 Atherosclerotic heart disease of native coronary artery without angina pectoris: Secondary | ICD-10-CM | POA: Insufficient documentation

## 2019-09-19 DIAGNOSIS — I350 Nonrheumatic aortic (valve) stenosis: Secondary | ICD-10-CM | POA: Insufficient documentation

## 2019-09-19 DIAGNOSIS — M109 Gout, unspecified: Secondary | ICD-10-CM | POA: Insufficient documentation

## 2019-09-19 DIAGNOSIS — Z20822 Contact with and (suspected) exposure to covid-19: Secondary | ICD-10-CM | POA: Diagnosis not present

## 2019-09-19 DIAGNOSIS — E1122 Type 2 diabetes mellitus with diabetic chronic kidney disease: Secondary | ICD-10-CM | POA: Diagnosis not present

## 2019-09-19 DIAGNOSIS — K219 Gastro-esophageal reflux disease without esophagitis: Secondary | ICD-10-CM | POA: Diagnosis not present

## 2019-09-19 DIAGNOSIS — F329 Major depressive disorder, single episode, unspecified: Secondary | ICD-10-CM | POA: Insufficient documentation

## 2019-09-19 DIAGNOSIS — N2581 Secondary hyperparathyroidism of renal origin: Secondary | ICD-10-CM | POA: Diagnosis not present

## 2019-09-19 DIAGNOSIS — Z794 Long term (current) use of insulin: Secondary | ICD-10-CM | POA: Insufficient documentation

## 2019-09-19 DIAGNOSIS — Z6841 Body Mass Index (BMI) 40.0 and over, adult: Secondary | ICD-10-CM | POA: Insufficient documentation

## 2019-09-19 DIAGNOSIS — D631 Anemia in chronic kidney disease: Secondary | ICD-10-CM | POA: Diagnosis not present

## 2019-09-19 DIAGNOSIS — N186 End stage renal disease: Secondary | ICD-10-CM

## 2019-09-19 DIAGNOSIS — Z79899 Other long term (current) drug therapy: Secondary | ICD-10-CM | POA: Diagnosis not present

## 2019-09-19 DIAGNOSIS — M79621 Pain in right upper arm: Secondary | ICD-10-CM | POA: Insufficient documentation

## 2019-09-19 DIAGNOSIS — M79601 Pain in right arm: Secondary | ICD-10-CM | POA: Diagnosis not present

## 2019-09-19 DIAGNOSIS — D509 Iron deficiency anemia, unspecified: Secondary | ICD-10-CM | POA: Diagnosis not present

## 2019-09-19 DIAGNOSIS — Z87891 Personal history of nicotine dependence: Secondary | ICD-10-CM | POA: Diagnosis not present

## 2019-09-19 DIAGNOSIS — I5042 Chronic combined systolic (congestive) and diastolic (congestive) heart failure: Secondary | ICD-10-CM | POA: Diagnosis not present

## 2019-09-19 DIAGNOSIS — Z955 Presence of coronary angioplasty implant and graft: Secondary | ICD-10-CM | POA: Insufficient documentation

## 2019-09-19 DIAGNOSIS — Z7982 Long term (current) use of aspirin: Secondary | ICD-10-CM | POA: Diagnosis not present

## 2019-09-19 DIAGNOSIS — Z992 Dependence on renal dialysis: Secondary | ICD-10-CM | POA: Diagnosis not present

## 2019-09-19 DIAGNOSIS — I132 Hypertensive heart and chronic kidney disease with heart failure and with stage 5 chronic kidney disease, or end stage renal disease: Secondary | ICD-10-CM | POA: Diagnosis not present

## 2019-09-19 DIAGNOSIS — I252 Old myocardial infarction: Secondary | ICD-10-CM | POA: Insufficient documentation

## 2019-09-19 DIAGNOSIS — E669 Obesity, unspecified: Secondary | ICD-10-CM | POA: Insufficient documentation

## 2019-09-19 HISTORY — PX: UPPER EXTREMITY ANGIOGRAPHY: CATH118270

## 2019-09-19 LAB — POTASSIUM (ARMC VASCULAR LAB ONLY): Potassium (ARMC vascular lab): 3.5 (ref 3.5–5.1)

## 2019-09-19 LAB — GLUCOSE, CAPILLARY
Glucose-Capillary: 177 mg/dL — ABNORMAL HIGH (ref 70–99)
Glucose-Capillary: 259 mg/dL — ABNORMAL HIGH (ref 70–99)
Glucose-Capillary: 358 mg/dL — ABNORMAL HIGH (ref 70–99)

## 2019-09-19 IMAGING — CT CT FEMUR *L* W/O CM
3 series · 12 of 33 positions shown, 14 images · non-contrast
Comparison: None.

CLINICAL DATA: Left thigh swelling and pain.

EXAM:
CT OF THE LOWER LEFT EXTREMITY WITHOUT CONTRAST
TECHNIQUE: Multidetector CT imaging of the lower left extremity was performed
according to the standard protocol.

[Series 2: lt femur · axial · 0.61mm/px · z∈[+672,+1038]mm · 4 of 265 slices shown, 5 images]
[im 41/265  soft-tissue]
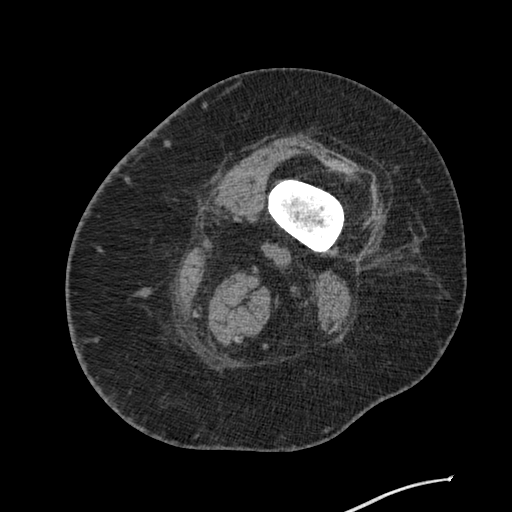
[im 41/265  bone]
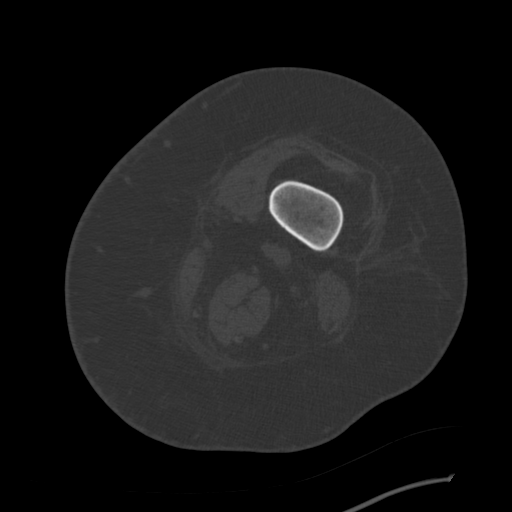
[im 102/265  bone]
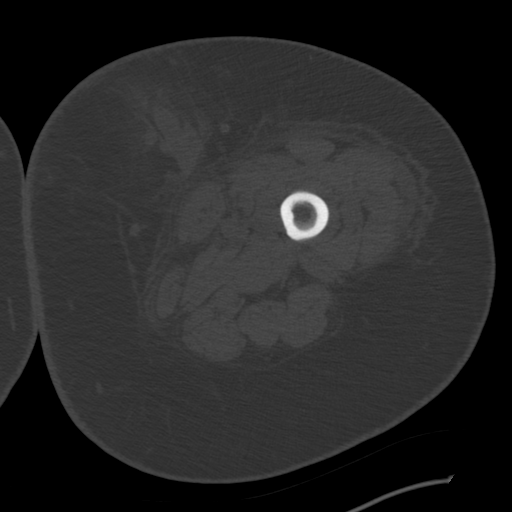
[im 163/265  bone]
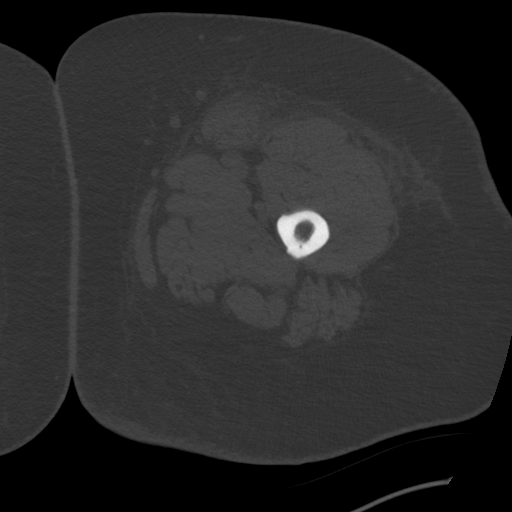
[im 224/265  bone]
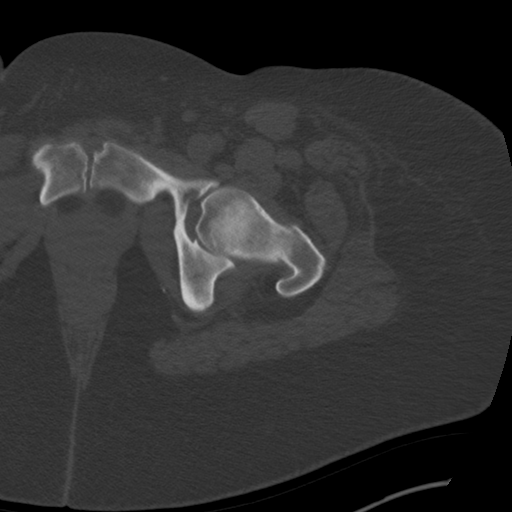

[Series 4: coronal images · coronal · 0.58mm/px · 3 of 155 slices shown]
[im 31/155  bone]
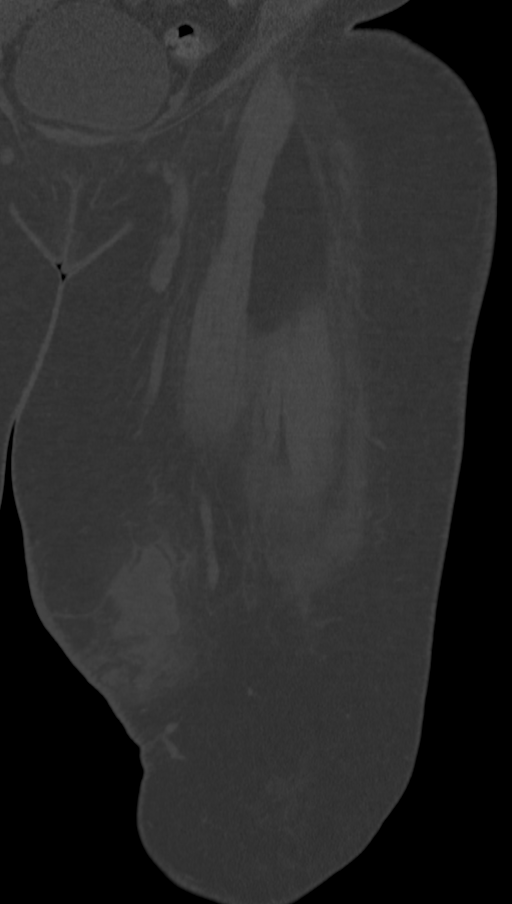
[im 62/155  bone]
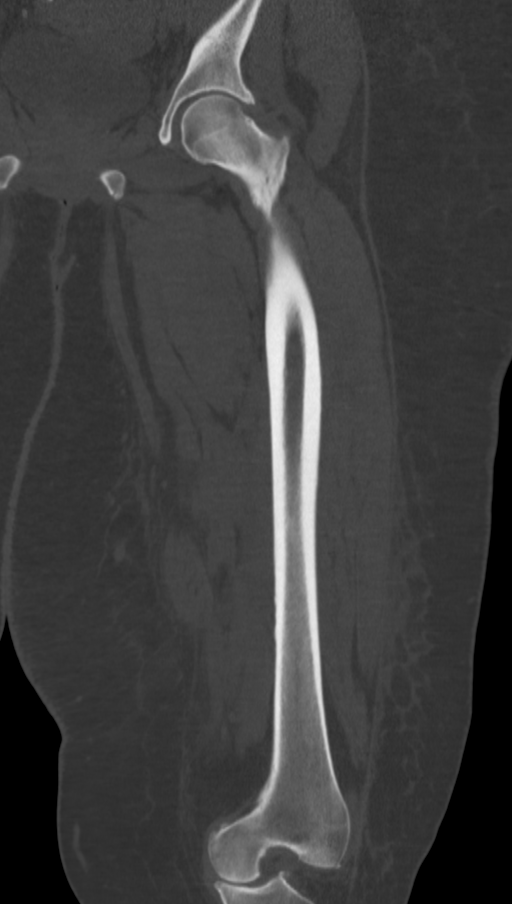
[im 93/155  bone]
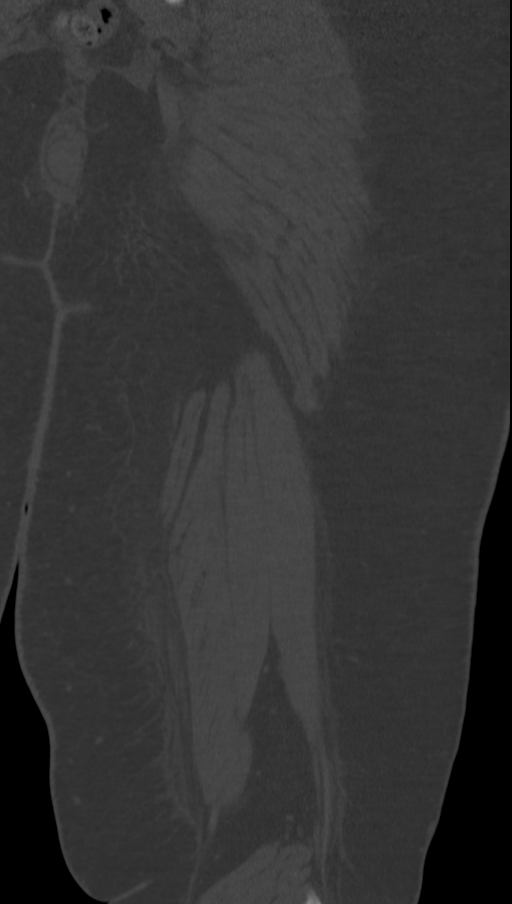

[Series 5: sagittal images · sagittal · 0.64mm/px · 5 of 149 slices shown, 6 images]
[im 50/149  bone]
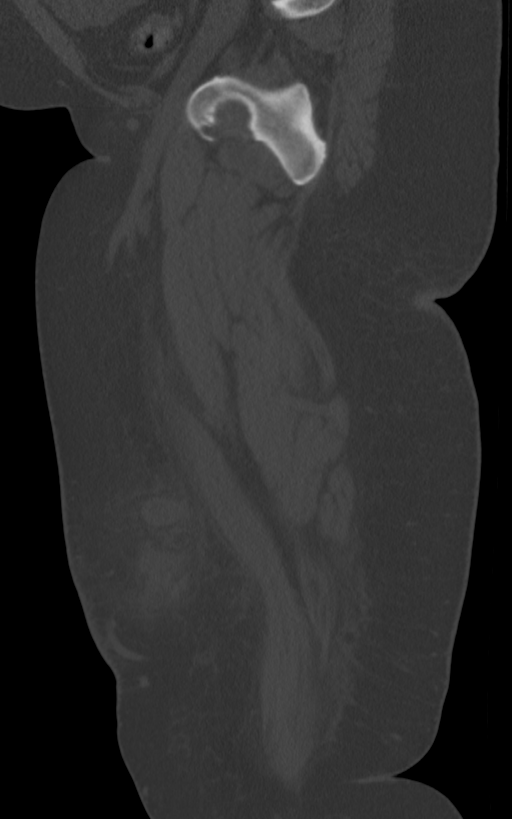
[im 62/149  bone]
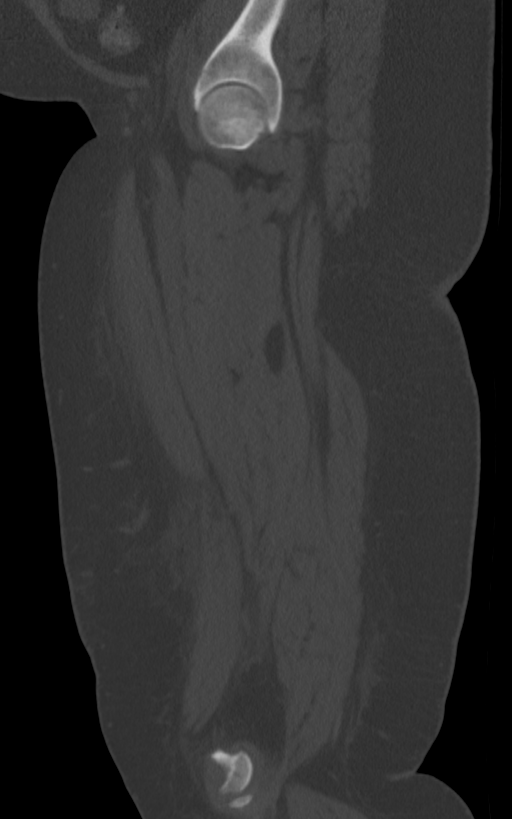
[im 75/149  soft-tissue]
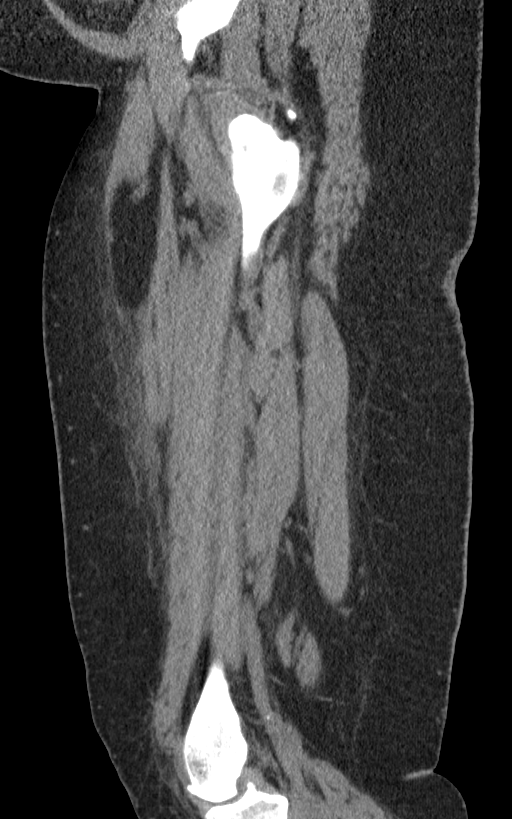
[im 75/149  bone]
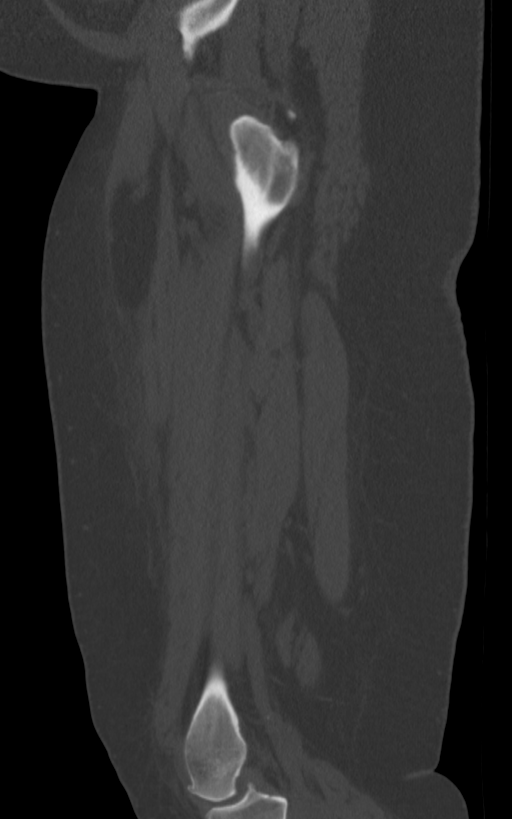
[im 87/149  bone]
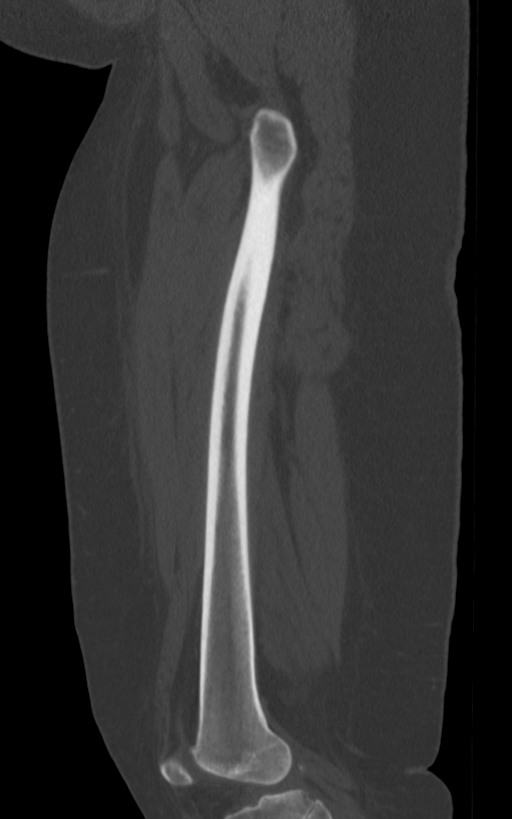
[im 99/149  bone]
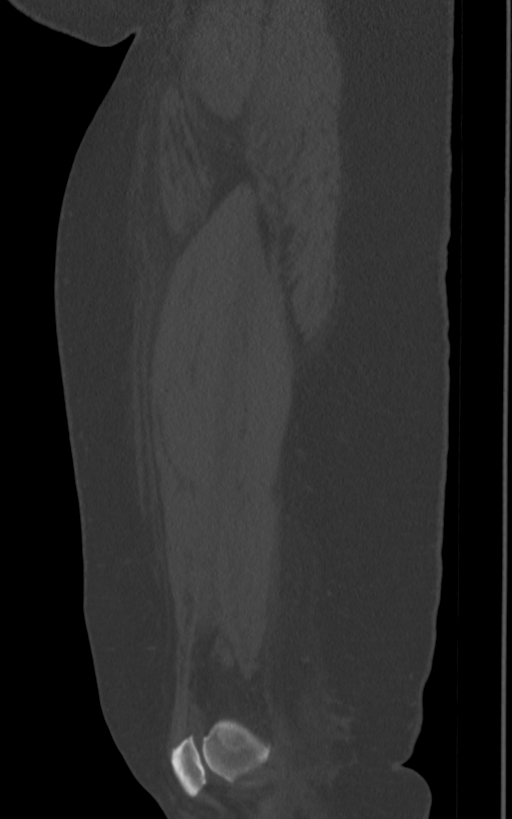

[12 of 33 positions shown; findings below may reference images not displayed]

FINDINGS: Bones/Joint/Cartilage

Negative for acute fracture or bone destruction. Osteoarthritis of
the left hip and knee with joint space narrowing. Spurring of the
femoral condyles consistent with osteoarthritis. No suspicious
osseous lesions.

Ligaments

Suboptimally assessed by CT.

Muscles and Tendons

Subcutaneous edema and scant tracking fluid overlying the anterior
and medial compartmental muscles of the thigh.

Soft tissues

There is an approximately 7.4 x 6.6 x 2.5 cm subcutaneous abscess
along the medial aspect of the mid to distal left thigh with
adjacent subcutaneous soft tissue fatty induration consistent with
cellulitis. This collection is seen at least 19-20 cm from the
inguinal crease.

Two small lipomas involving the medial compartment muscles are
noted, the largest approximately 2 cm.
IMPRESSION: 1. Within the medial aspect of the thigh approximately 20 cm caudad
from the left inguinal crease is a subcutaneous abscess measuring
7.4 x 6.6 x 2.5 cm. There is associated skin thickening and
subcutaneous fatty induration consistent with cellulitis.
2. No acute osseous appearing abnormality. There is osteoarthritis
of the left knee joint and mild degenerative joint space narrowing
of the left hip.

## 2019-09-19 SURGERY — UPPER EXTREMITY ANGIOGRAPHY
Anesthesia: Moderate Sedation | Site: Arm Upper | Laterality: Right

## 2019-09-19 MED ORDER — LABETALOL HCL 5 MG/ML IV SOLN: 10.0000 mg | INTRAVENOUS | Status: DC | PRN

## 2019-09-19 MED ORDER — INSULIN ASPART 100 UNIT/ML ~~LOC~~ SOLN
SUBCUTANEOUS | Status: AC
  Filled 2019-09-19: qty 1

## 2019-09-19 MED ORDER — SODIUM CHLORIDE 0.9 % IV SOLN: INTRAVENOUS | Status: DC

## 2019-09-19 MED ORDER — IODIXANOL 320 MG/ML IV SOLN
INTRAVENOUS | Status: DC | PRN
  Administered 2019-09-19: 30 mL via INTRA_ARTERIAL

## 2019-09-19 MED ORDER — INSULIN ASPART 100 UNIT/ML ~~LOC~~ SOLN
SUBCUTANEOUS | Status: AC
  Administered 2019-09-19: 10 [IU] via SUBCUTANEOUS
  Filled 2019-09-19: qty 1

## 2019-09-19 MED ORDER — FAMOTIDINE 20 MG PO TABS
ORAL_TABLET | ORAL | Status: AC
  Administered 2019-09-19: 40 mg via ORAL
  Filled 2019-09-19: qty 2

## 2019-09-19 MED ORDER — FENTANYL CITRATE (PF) 100 MCG/2ML IJ SOLN
INTRAMUSCULAR | Status: AC
  Filled 2019-09-19: qty 2

## 2019-09-19 MED ORDER — DIPHENHYDRAMINE HCL 50 MG/ML IJ SOLN: 50.0000 mg | Freq: Once | INTRAMUSCULAR | Status: AC | PRN

## 2019-09-19 MED ORDER — MIDAZOLAM HCL 5 MG/5ML IJ SOLN
INTRAMUSCULAR | Status: AC
  Filled 2019-09-19: qty 5

## 2019-09-19 MED ORDER — ONDANSETRON HCL 4 MG/2ML IJ SOLN: 4.0000 mg | Freq: Four times a day (QID) | INTRAMUSCULAR | Status: DC | PRN

## 2019-09-19 MED ORDER — METHYLPREDNISOLONE SODIUM SUCC 125 MG IJ SOLR
INTRAMUSCULAR | Status: AC
  Administered 2019-09-19: 125 mg via INTRAVENOUS
  Filled 2019-09-19: qty 2

## 2019-09-19 MED ORDER — INSULIN ASPART 100 UNIT/ML ~~LOC~~ SOLN: 10.0000 [IU] | Freq: Once | SUBCUTANEOUS | Status: AC

## 2019-09-19 MED ORDER — FAMOTIDINE 20 MG PO TABS: 40.0000 mg | ORAL_TABLET | Freq: Once | ORAL | Status: AC | PRN

## 2019-09-19 MED ORDER — MIDAZOLAM HCL 2 MG/2ML IJ SOLN
INTRAMUSCULAR | Status: DC | PRN
  Administered 2019-09-19 (×2): 1 mg via INTRAVENOUS

## 2019-09-19 MED ORDER — INSULIN ASPART 100 UNIT/ML ~~LOC~~ SOLN
8.0000 [IU] | Freq: Once | SUBCUTANEOUS | Status: AC
  Administered 2019-09-19: 8 [IU] via SUBCUTANEOUS

## 2019-09-19 MED ORDER — SODIUM CHLORIDE 0.9 % IV SOLN: 250.0000 mL | INTRAVENOUS | Status: DC | PRN

## 2019-09-19 MED ORDER — FENTANYL CITRATE (PF) 100 MCG/2ML IJ SOLN
INTRAMUSCULAR | Status: DC | PRN
  Administered 2019-09-19 (×2): 50 ug via INTRAVENOUS

## 2019-09-19 MED ORDER — CEFAZOLIN SODIUM-DEXTROSE 1-4 GM/50ML-% IV SOLN: 1.0000 g | Freq: Once | INTRAVENOUS | Status: AC

## 2019-09-19 MED ORDER — CEFAZOLIN SODIUM-DEXTROSE 1-4 GM/50ML-% IV SOLN
INTRAVENOUS | Status: AC
  Administered 2019-09-19: 1 g via INTRAVENOUS
  Filled 2019-09-19: qty 50

## 2019-09-19 MED ORDER — SODIUM CHLORIDE 0.9% FLUSH: 3.0000 mL | INTRAVENOUS | Status: DC | PRN

## 2019-09-19 MED ORDER — MORPHINE SULFATE (PF) 4 MG/ML IV SOLN: 2.0000 mg | INTRAVENOUS | Status: DC | PRN

## 2019-09-19 MED ORDER — HYDRALAZINE HCL 20 MG/ML IJ SOLN: 5.0000 mg | INTRAMUSCULAR | Status: DC | PRN

## 2019-09-19 MED ORDER — HEPARIN SODIUM (PORCINE) 1000 UNIT/ML IJ SOLN
INTRAMUSCULAR | Status: AC
  Filled 2019-09-19: qty 1

## 2019-09-19 MED ORDER — OXYCODONE HCL 5 MG PO TABS: 5.0000 mg | ORAL_TABLET | ORAL | Status: DC | PRN

## 2019-09-19 MED ORDER — ACETAMINOPHEN 325 MG PO TABS: 650.0000 mg | ORAL_TABLET | ORAL | Status: DC | PRN

## 2019-09-19 MED ORDER — DIPHENHYDRAMINE HCL 50 MG/ML IJ SOLN
INTRAMUSCULAR | Status: AC
  Administered 2019-09-19: 50 mg via INTRAVENOUS
  Filled 2019-09-19: qty 1

## 2019-09-19 MED ORDER — SODIUM CHLORIDE 0.9% FLUSH: 3.0000 mL | Freq: Two times a day (BID) | INTRAVENOUS | Status: DC

## 2019-09-19 MED ORDER — MIDAZOLAM HCL 2 MG/ML PO SYRP: 8.0000 mg | ORAL_SOLUTION | Freq: Once | ORAL | Status: DC | PRN

## 2019-09-19 MED ORDER — HYDROMORPHONE HCL 1 MG/ML IJ SOLN: 1.0000 mg | Freq: Once | INTRAMUSCULAR | Status: DC | PRN

## 2019-09-19 MED ORDER — METHYLPREDNISOLONE SODIUM SUCC 125 MG IJ SOLR: 125.0000 mg | Freq: Once | INTRAMUSCULAR | Status: AC | PRN

## 2019-09-19 SURGICAL SUPPLY — 11 items
CANNULA 5F STIFF (CANNULA) ×2 IMPLANT
CATH BEACON 5 .035 100 H1 TIP (CATHETERS) ×1 IMPLANT
CATH PIG 70CM (CATHETERS) ×2 IMPLANT
DEVICE STARCLOSE SE CLOSURE (Vascular Products) ×1 IMPLANT
DEVICE TORQUE .025-.038 (MISCELLANEOUS) ×1 IMPLANT
GLIDEWIRE ANGLED SS 035X260CM (WIRE) ×1 IMPLANT
PACK ANGIOGRAPHY (CUSTOM PROCEDURE TRAY) ×1 IMPLANT
SHEATH BRITE TIP 5FRX11 (SHEATH) ×1 IMPLANT
SYR MEDRAD MARK 7 150ML (SYRINGE) ×1 IMPLANT
TUBING CONTRAST HIGH PRESS 72 (TUBING) ×1 IMPLANT
WIRE J 3MM .035X145CM (WIRE) ×2 IMPLANT

## 2019-09-19 NOTE — Discharge Instructions (Signed)
Femoral Site Care This sheet gives you information about how to care for yourself after your procedure. Your health care provider may also give you more specific instructions. If you have problems or questions, contact your health care provider. What can I expect after the procedure? After the procedure, it is common to have:  Bruising that usually fades within 1-2 weeks.  Tenderness at the site. Follow these instructions at home: Wound care  Follow instructions from your health care provider about how to take care of your insertion site. Make sure you: ? Wash your hands with soap and water before you change your bandage (dressing). If soap and water are not available, use hand sanitizer. ? Change your dressing as told by your health care provider. ? Leave stitches (sutures), skin glue, or adhesive strips in place. These skin closures may need to stay in place for 2 weeks or longer. If adhesive strip edges start to loosen and curl up, you may trim the loose edges. Do not remove adhesive strips completely unless your health care provider tells you to do that.  Do not take baths, swim, or use a hot tub until your health care provider approves.  You may shower 24-48 hours after the procedure or as told by your health care provider. ? Gently wash the site with plain soap and water. ? Pat the area dry with a clean towel. ? Do not rub the site. This may cause bleeding.  Do not apply powder or lotion to the site. Keep the site clean and dry.  Check your femoral site every day for signs of infection. Check for: ? Redness, swelling, or pain. ? Fluid or blood. ? Warmth. ? Pus or a bad smell. Activity  For the first 2-3 days after your procedure, or as long as directed: ? Avoid climbing stairs as much as possible. ? Do not squat.  Do not lift anything that is heavier than 10 lb (4.5 kg), or the limit that you are told, until your health care provider says that it is safe.  Rest as  directed. ? Avoid sitting for a long time without moving. Get up to take short walks every 1-2 hours.  Do not drive for 24 hours if you were given a medicine to help you relax (sedative). General instructions  Take over-the-counter and prescription medicines only as told by your health care provider.  Keep all follow-up visits as told by your health care provider. This is important. Contact a health care provider if you have:  A fever or chills.  You have redness, swelling, or pain around your insertion site. Get help right away if:  The catheter insertion area swells very fast.  You pass out.  You suddenly start to sweat or your skin gets clammy.  The catheter insertion area is bleeding, and the bleeding does not stop when you hold steady pressure on the area.  The area near or just beyond the catheter insertion site becomes pale, cool, tingly, or numb. These symptoms may represent a serious problem that is an emergency. Do not wait to see if the symptoms will go away. Get medical help right away. Call your local emergency services (911 in the [place] Do not drive yourself to the hospital. Summary  After the procedure, it is common to have bruising that usually fades within 1-2 weeks.  Check your femoral site every day for signs of infection.  Do not lift anything that is heavier than 10 lb (4.5 kg), or the   limit that you are told, until your health care provider says that it is safe. This information is not intended to replace advice given to you by your health care provider. Make sure you discuss any questions you have with your health care provider. Document Revised: 07/12/2017 Document Reviewed: 07/12/2017 Elsevier Patient Education  2020 [employer]    Moderate Conscious Sedation, Adult, Care After These instructions provide you with information about caring for yourself after your procedure. Your health care provider may also give you more specific instructions. Your  treatment has been planned according to current medical practices, but problems sometimes occur. Call your health care provider if you have any problems or questions after your procedure. What can I expect after the procedure? After your procedure, it is common:  To feel sleepy for several hours.  To feel clumsy and have poor balance for several hours.  To have poor judgment for several hours.  To vomit if you eat too soon. Follow these instructions at home: For at least 24 hours after the procedure:   Do not: ? Participate in activities where you could fall or become injured. ? Drive. ? Use heavy machinery. ? Drink alcohol. ? Take sleeping pills or medicines that cause drowsiness. ? Make important decisions or sign legal documents. ? Take care of children on your own.  Rest. Eating and drinking  Follow the diet recommended by your health care provider.  If you vomit: ? Drink water, juice, or soup when you can drink without vomiting. ? Make sure you have little or no nausea before eating solid foods. General instructions  Have a responsible adult stay with you until you are awake and alert.  Take over-the-counter and prescription medicines only as told by your health care provider.  If you smoke, do not smoke without supervision.  Keep all follow-up visits as told by your health care provider. This is important. Contact a health care provider if:  You keep feeling nauseous or you keep vomiting.  You feel light-headed.  You develop a rash.  You have a fever. Get help right away if:  You have trouble breathing. This information is not intended to replace advice given to you by your health care provider. Make sure you discuss any questions you have with your health care provider. Document Revised: 06/11/2017 Document Reviewed: 10/19/2015 Elsevier Patient Education  2020 [employer]vier Inc.  

## 2019-09-19 NOTE — Op Note (Signed)
[facility]  Percutaneous Study/Intervention Procedural Note   Date of Surgery: 09/19/2019,2:12 PM  Surgeon:Naiara Lombardozzi, Dolores Lory   Pre-operative Diagnosis: Right arm and wrist pain  Post-operative diagnosis:  Same  Procedure(s) Performed:  1.  Arch aortogram  2.  Right upper extremity angiography third order catheter placement  3.  Star close right common femoral arteriotomy    Anesthesia: Conscious sedation was administered by the interventional radiology RN under my direct supervision. IV Versed plus fentanyl were utilized. Continuous ECG, pulse oximetry and blood pressure was monitored throughout the entire procedure.  Conscious sedation was administered for a total of 30 minutes.  Sheath: 5 French 11 cm Pinnacle sheath retrograde right common femoral artery  Contrast: 30 cc   Fluoroscopy Time: 3.4 minutes  Indications:  The patient presents to [facility] with pain in the right shoulder and right wrist ever since her tunneled dialysis catheter was removed.  Pulses are nonpalpable at the wrist suggesting atherosclerotic occlusive disease.  The risks and benefits as well as alternative therapies for upper extremity revascularization are reviewed with the patient all questions are answered the patient agrees to proceed.  The patient is therefore undergoing angiography with the hope for intervention for limb salvage.   Procedure:  [name] a 64 y.o. female who was identified and appropriate procedural time out was performed.  The patient was then placed supine on the table and prepped and draped in the usual sterile fashion.  Ultrasound was used to evaluate the right common femoral artery.  It was echolucent and pulsatile indicating it is patent .  An ultrasound image was acquired for the permanent record.  A micropuncture needle was used to access the right common femoral artery under direct ultrasound guidance.  The microwire was then advanced under  fluoroscopic guidance without difficulty followed by the micro-sheath.  A 0.035 J wire was advanced without resistance and a 5Fr sheath was placed.    Pigtail catheter was then advanced to the level of the ascending aorta and an LAO projection of the aortic arch was obtained.   H1 catheter was then exchanged over a stiff angled Glidewire and the innominate artery was selected.  Hand-injection of contrast was then utilized in the AP projection to demonstrate the innominate as well as the subclavian.  The wire was then reintroduced and the catheter advanced in an serial fashion arteriography was obtained of the right upper extremity with the catheter positioned in the distal brachial artery at its longest point.  After review of the images the catheter was removed over wire and an RAO view of the groin was obtained. StarClose device was deployed without difficulty.   Findings:   Arch aortogram: The aortic arch is opacified with a bolus injection of contrast.  There are no significant calcifications or ulcerations of the arch noted.  The great vessels are all visualized and are widely patent.  Origin of the common carotid and subclavian are widely patent.  Right Upper Extremity: The right subclavian axillary and brachial arteries are widely patent.  There is no evidence of hemodynamically significant stenosis or stricture.  The trifurcation in the forearm is widely patent.  The ulnar is dominant it is approximately 3 mm in diameter and fills the palmar arch both deep and superficial.  The interosseous is also widely patent and collateralizes to the distal radial artery just proximal to the thumb.  The radial artery is patent throughout its entire course however in its distal one third  there is diffuse disease which is hemodynamically significant.  However since the ulnar and the interosseous are filling both the deep and superficial palmar arches there is no evidence of compromised perfusion of the  hand.    Disposition: Patient was taken to the recovery room in stable condition having tolerated the procedure well.  Belenda Cruise Betzy Barbier 09/19/2019,2:12 PM

## 2019-09-19 NOTE — Progress Notes (Signed)
Patient instructed to take her CBG when she returns home. Patient was also instructed to call her PCP if her CBG remains high despite her home regimen. Patient will come to the ED should she become symptomatic. Patient understands and state she will do so. Dr. Delana Meyer aware of patients CBG levels.   Mattie Marlin, RN 09/19/2019

## 2019-09-19 NOTE — H&P (Signed)
[place] [facility] History & Physical Update  The patient was interviewed and re-examined.  The patient's previous History and Physical has been reviewed and is unchanged.  There is no change in the plan of care. We plan to proceed with the scheduled procedure.  Hortencia Pilar, MD  09/19/2019, 1:31 PM

## 2019-09-20 ENCOUNTER — Encounter: Payer: Self-pay | Admitting: Cardiology

## 2019-09-21 ENCOUNTER — Other Ambulatory Visit: Payer: Self-pay | Admitting: Physician Assistant

## 2019-09-21 DIAGNOSIS — D509 Iron deficiency anemia, unspecified: Secondary | ICD-10-CM | POA: Diagnosis not present

## 2019-09-21 DIAGNOSIS — N2581 Secondary hyperparathyroidism of renal origin: Secondary | ICD-10-CM | POA: Diagnosis not present

## 2019-09-21 DIAGNOSIS — N186 End stage renal disease: Secondary | ICD-10-CM | POA: Diagnosis not present

## 2019-09-21 DIAGNOSIS — M62830 Muscle spasm of back: Secondary | ICD-10-CM

## 2019-09-21 DIAGNOSIS — D631 Anemia in chronic kidney disease: Secondary | ICD-10-CM | POA: Diagnosis not present

## 2019-09-21 DIAGNOSIS — Z992 Dependence on renal dialysis: Secondary | ICD-10-CM | POA: Diagnosis not present

## 2019-09-21 NOTE — Telephone Encounter (Signed)
Requested medication (s) are due for refill today- yes  Requested medication (s) are on the active medication list -yes  Future visit scheduled -no  Last refill: 07/18/19  Notes to clinic: Patient is requesting refill of non delegated Rx- sent for PCP review   Requested Prescriptions  Pending Prescriptions Disp Refills   traMADol (ULTRAM) 50 MG tablet [Pharmacy Med Name: TRAMADOL HCL 50 MG TABLET] 40 tablet 0    Sig: TAKE (1) OR (2) TABLETS EVERY SIX HOURS AS NEEDED FOR PAIN. -MAY MAKEDROWSY-      Not Delegated - Analgesics:  Opioid Agonists Failed - 09/21/2019 10:44 AM      Failed - This refill cannot be delegated      Passed - Urine Drug Screen completed in last 360 days.      Passed - Valid encounter within last 6 months    Recent Outpatient Visits           1 month ago Chest pain, unspecified type   [place], [place]   4 months ago Acute right-sided low back pain without sciatica   [place], [place]   7 months ago Chronic hepatitis C without hepatic coma [facility])   [place], Clearnce Sorrel, PA-C   1 year ago ESRD on dialysis [facility])   [place], [place], [place]   1 year ago Diarrhea, unspecified type   [place], [place], [place]                  Requested Prescriptions  Pending Prescriptions Disp Refills   traMADol (ULTRAM) 50 MG tablet [Pharmacy Med Name: TRAMADOL HCL 50 MG TABLET] 40 tablet 0    Sig: TAKE (1) OR (2) TABLETS EVERY SIX HOURS AS NEEDED FOR PAIN. -MAY MAKEDROWSY-      Not Delegated - Analgesics:  Opioid Agonists Failed - 09/21/2019 10:44 AM      Failed - This refill cannot be delegated      Passed - Urine Drug Screen completed in last 360 days.      Passed - Valid encounter within last 6 months    Recent Outpatient Visits           1 month ago Chest pain, unspecified type   [place], [place]   4 months ago Acute right-sided low back pain without sciatica   [place], [place]   7 months ago Chronic hepatitis C without hepatic coma [facility])   [place], Clearnce Sorrel, PA-C   1 year ago ESRD on dialysis [facility])   [place], [place], [place]   1 year ago Diarrhea, unspecified type   [place], [place], [place]

## 2019-09-22 ENCOUNTER — Other Ambulatory Visit (INDEPENDENT_AMBULATORY_CARE_PROVIDER_SITE_OTHER): Payer: Self-pay | Admitting: Nurse Practitioner

## 2019-09-22 DIAGNOSIS — M79601 Pain in right arm: Secondary | ICD-10-CM

## 2019-09-23 DIAGNOSIS — D509 Iron deficiency anemia, unspecified: Secondary | ICD-10-CM | POA: Diagnosis not present

## 2019-09-23 DIAGNOSIS — D631 Anemia in chronic kidney disease: Secondary | ICD-10-CM | POA: Diagnosis not present

## 2019-09-23 DIAGNOSIS — Z992 Dependence on renal dialysis: Secondary | ICD-10-CM | POA: Diagnosis not present

## 2019-09-23 DIAGNOSIS — N186 End stage renal disease: Secondary | ICD-10-CM | POA: Diagnosis not present

## 2019-09-23 DIAGNOSIS — N2581 Secondary hyperparathyroidism of renal origin: Secondary | ICD-10-CM | POA: Diagnosis not present

## 2019-09-23 IMAGING — CT CT FEMUR *L* W/O CM
3 series · 11 of 35 positions shown, 13 images · non-contrast
Comparison: CT left upper leg 06/01/2017.

CLINICAL DATA: Status post incision and drainage of an abscess in
the left thigh 06/01/2017. Subsequent encounter.

EXAM:
CT OF THE LOWER LEFT EXTREMITY WITHOUT CONTRAST
TECHNIQUE: Multidetector CT imaging of the lower left extremity was performed
according to the standard protocol.

[Series 5: axial st · axial · 0.59mm/px · z∈[-440,-119]mm · 3 of 349 slices shown, 4 images]
[im 81/349  soft-tissue]
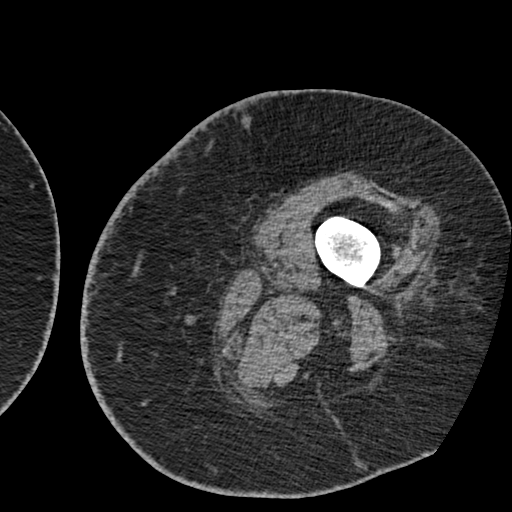
[im 81/349  bone]
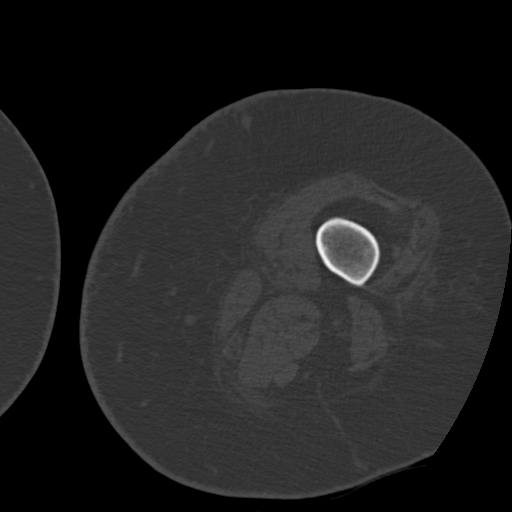
[im 188/349  bone]
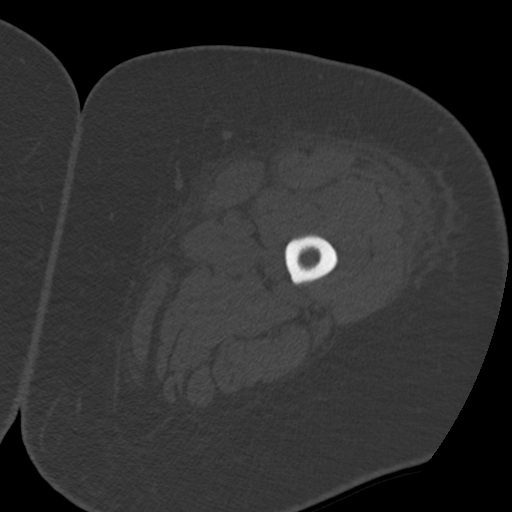
[im 295/349  bone]
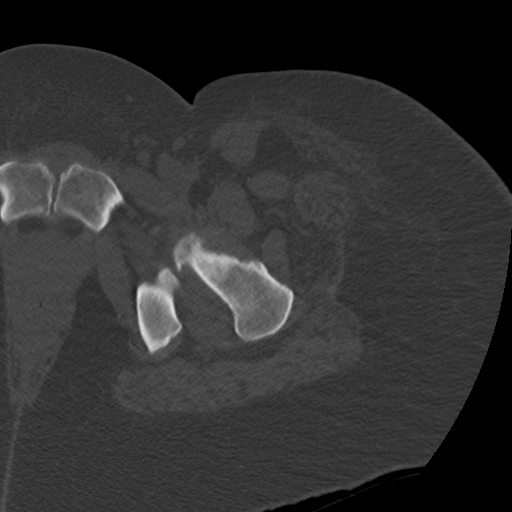

[Series 8: coronal st · coronal · 0.70mm/px · 3 of 188 slices shown]
[im 38/188  bone]
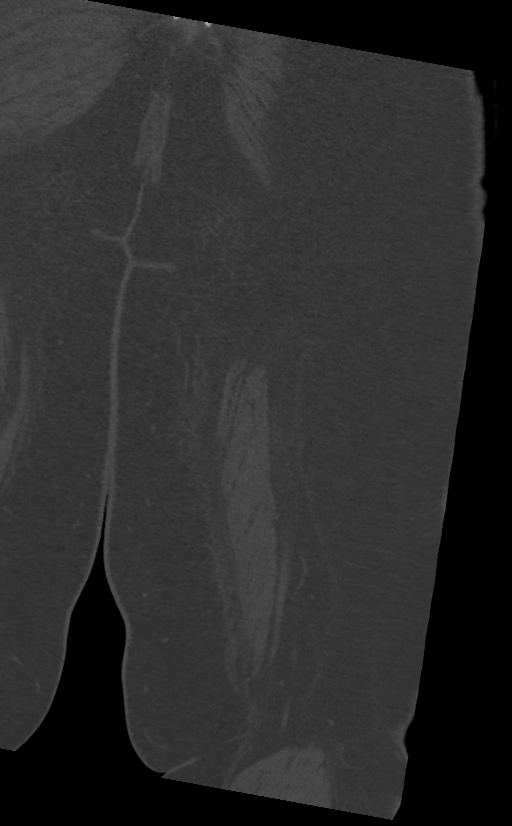
[im 75/188  bone]
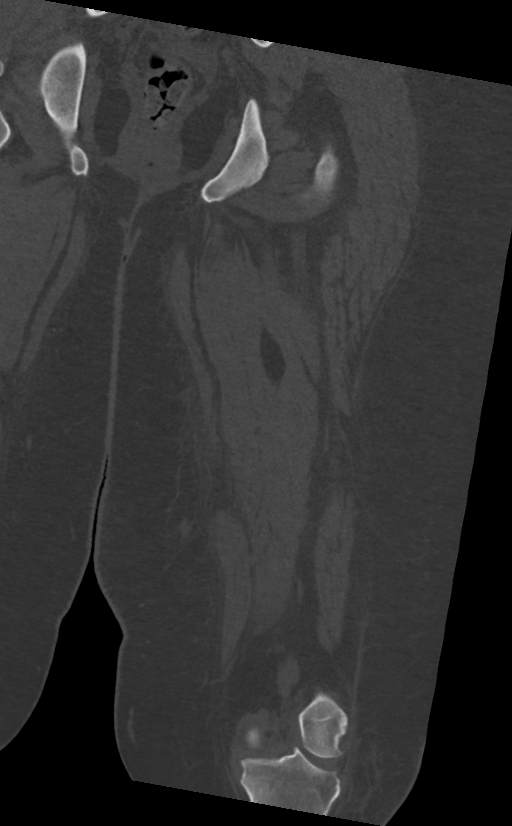
[im 113/188  bone]
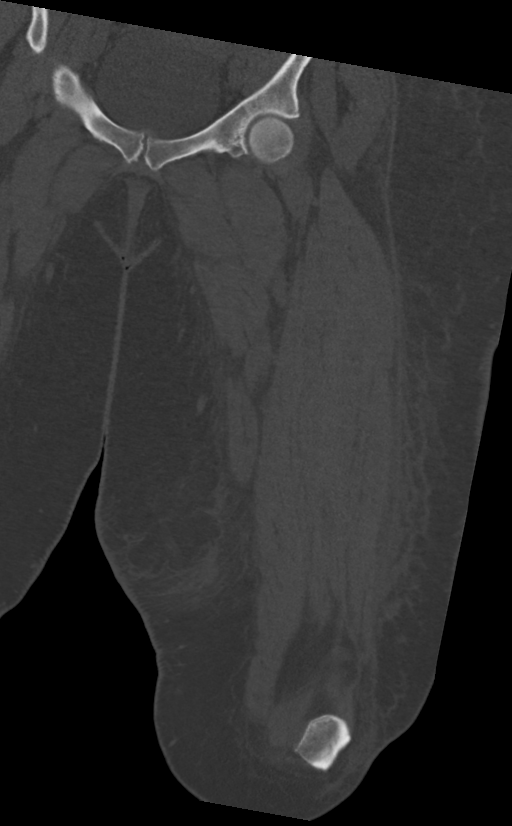

[Series 9: sagittal st · sagittal · 0.61mm/px · 5 of 194 slices shown, 6 images]
[im 65/194  bone]
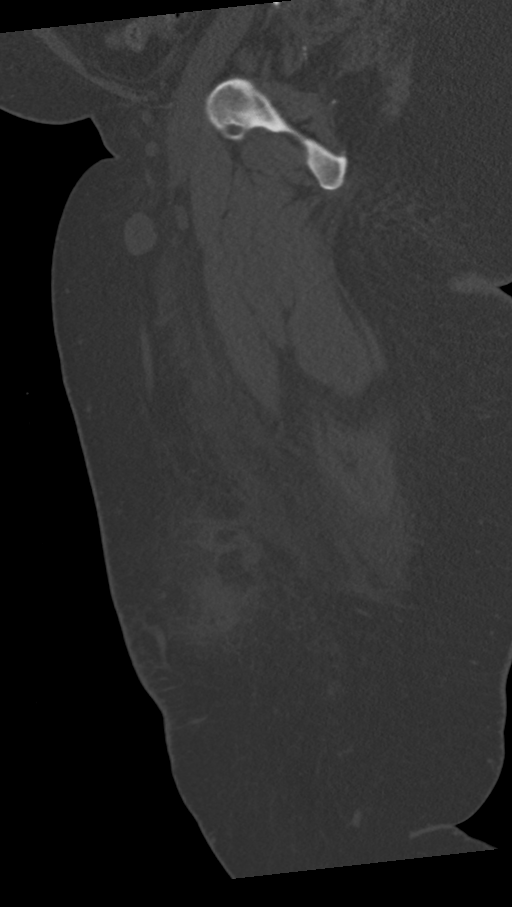
[im 81/194  bone]
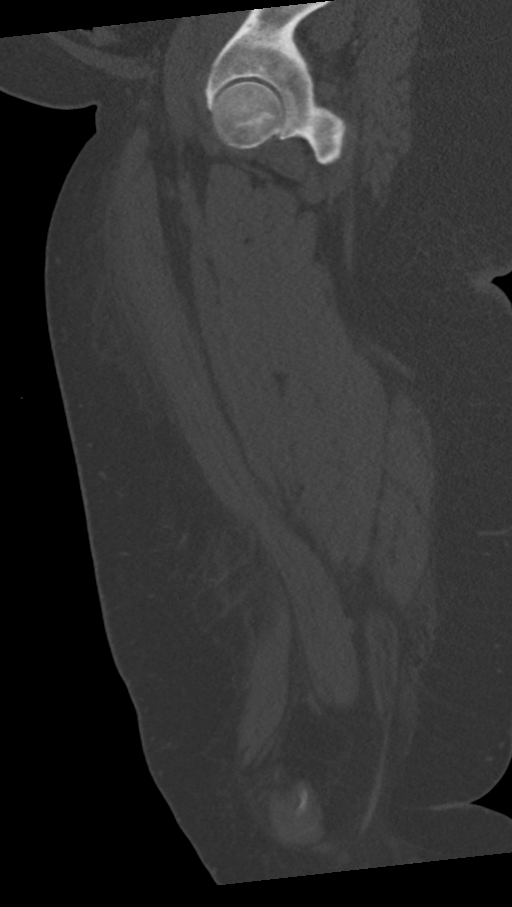
[im 97/194  soft-tissue]
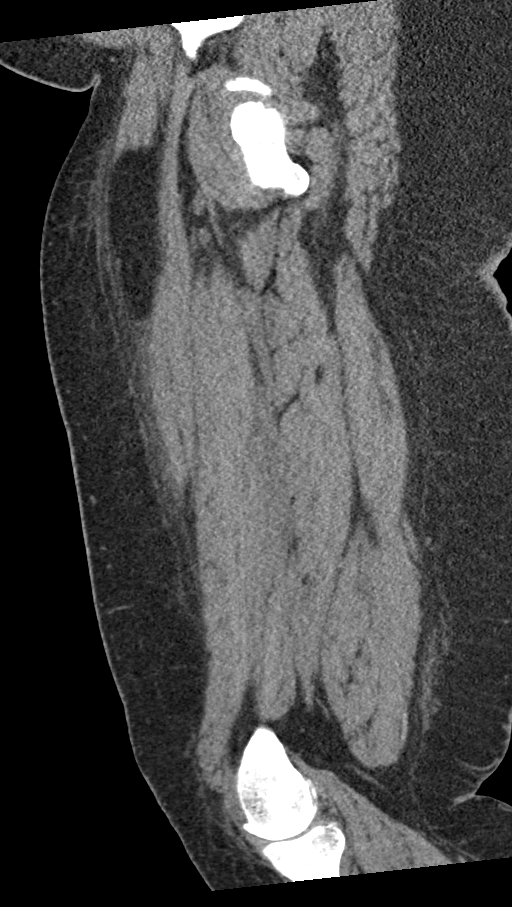
[im 97/194  bone]
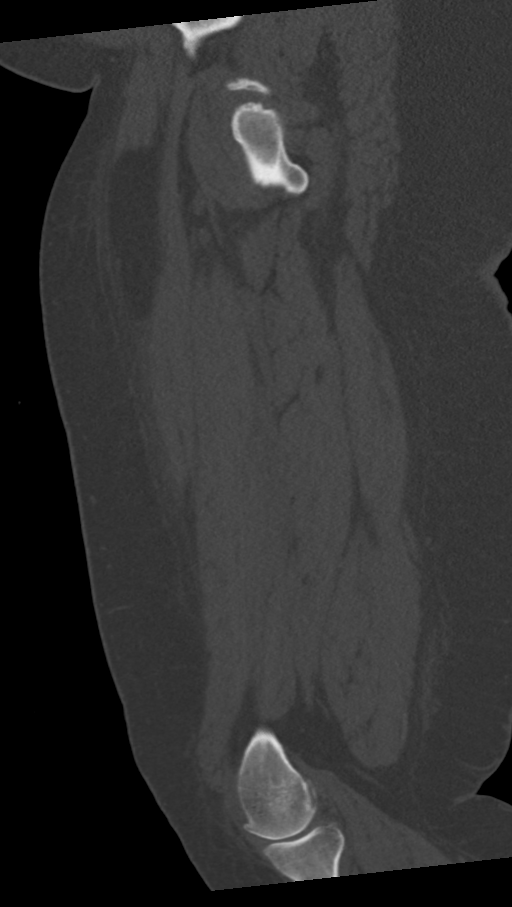
[im 113/194  bone]
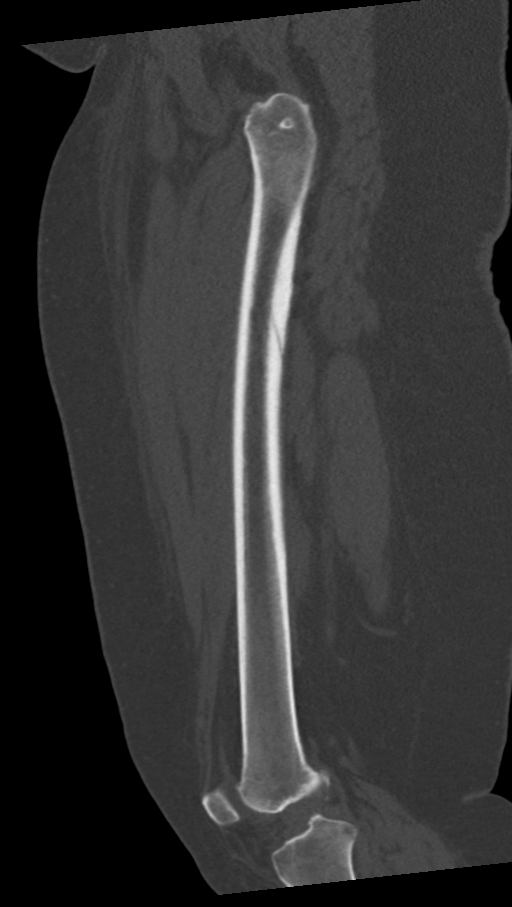
[im 129/194  bone]
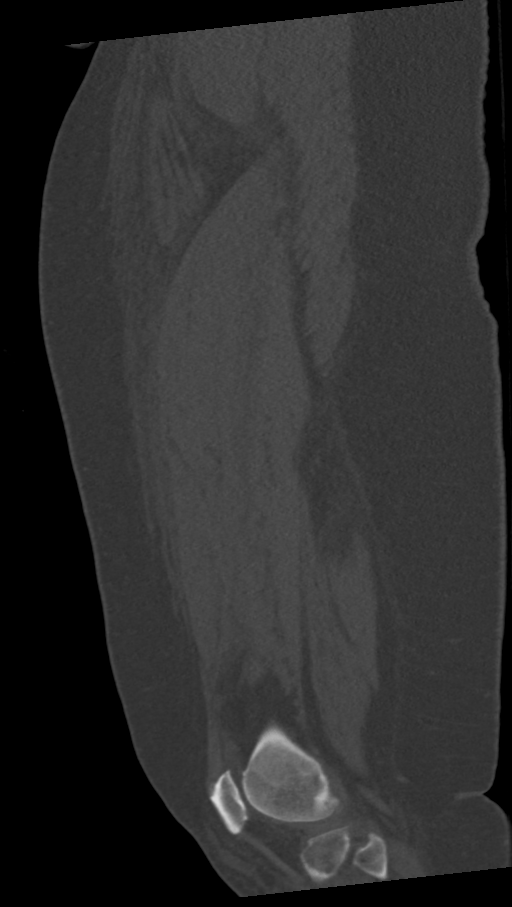

[11 of 35 positions shown; findings below may reference images not displayed]

FINDINGS: Bones/Joint/Cartilage

No bony destructive change or periosteal reaction to suggest
osteomyelitis is identified. There is some degenerative change about
the left knee which appears worst in the medial compartment.

Ligaments

Suboptimally assessed by CT.

Muscles and Tendons

Again seen is edema in the deep subcutaneous tissues of the anterior
thigh contiguous with anterior and lateral compartment musculature.
No intramuscular fluid collection is identified. Fat planes in
muscle are preserved. Small, simple lipoma in the adductor
musculature is again seen.

Soft tissues

Since the prior examination, the patient has undergone incision and
drainage of a subcutaneous fluid collection in the fatty soft
tissues of the medial thigh. The superficial aspect of wound appears
to be packed. A small fluid collection containing a locule air
measures 2.5 cm AP by 1.3 cm transverse by 2.9 cm craniocaudal
compared to 2.5 cm transverse by 6.6 cm AP by 7.4 cm craniocaudal.
There is persistent stranding in subcutaneous fat about the
collection consistent with cellulitis. The appearance is mildly
improved.
IMPRESSION: No new abnormality since the prior examination.

Marked decrease in the size of an abscess in subcutaneous fatty
tissues with some improvement and changes consistent surrounding
with cellulitis.

## 2019-09-26 ENCOUNTER — Telehealth (INDEPENDENT_AMBULATORY_CARE_PROVIDER_SITE_OTHER): Payer: Self-pay | Admitting: Vascular Surgery

## 2019-09-26 ENCOUNTER — Other Ambulatory Visit (INDEPENDENT_AMBULATORY_CARE_PROVIDER_SITE_OTHER): Payer: Self-pay | Admitting: Nurse Practitioner

## 2019-09-26 DIAGNOSIS — Z992 Dependence on renal dialysis: Secondary | ICD-10-CM | POA: Diagnosis not present

## 2019-09-26 DIAGNOSIS — M79601 Pain in right arm: Secondary | ICD-10-CM

## 2019-09-26 DIAGNOSIS — D631 Anemia in chronic kidney disease: Secondary | ICD-10-CM | POA: Diagnosis not present

## 2019-09-26 DIAGNOSIS — N186 End stage renal disease: Secondary | ICD-10-CM | POA: Diagnosis not present

## 2019-09-26 DIAGNOSIS — D509 Iron deficiency anemia, unspecified: Secondary | ICD-10-CM | POA: Diagnosis not present

## 2019-09-26 DIAGNOSIS — N2581 Secondary hyperparathyroidism of renal origin: Secondary | ICD-10-CM | POA: Diagnosis not present

## 2019-09-26 NOTE — Telephone Encounter (Signed)
Referral sent to [facility]

## 2019-09-27 ENCOUNTER — Ambulatory Visit: Payer: Medicare Other | Admitting: Podiatry

## 2019-09-27 NOTE — Telephone Encounter (Signed)
A detail message has been left on the patient voicemail

## 2019-09-28 DIAGNOSIS — N2581 Secondary hyperparathyroidism of renal origin: Secondary | ICD-10-CM | POA: Diagnosis not present

## 2019-09-28 DIAGNOSIS — Z992 Dependence on renal dialysis: Secondary | ICD-10-CM | POA: Diagnosis not present

## 2019-09-28 DIAGNOSIS — D509 Iron deficiency anemia, unspecified: Secondary | ICD-10-CM | POA: Diagnosis not present

## 2019-09-28 DIAGNOSIS — N186 End stage renal disease: Secondary | ICD-10-CM | POA: Diagnosis not present

## 2019-09-28 DIAGNOSIS — D631 Anemia in chronic kidney disease: Secondary | ICD-10-CM | POA: Diagnosis not present

## 2019-10-03 DIAGNOSIS — N2581 Secondary hyperparathyroidism of renal origin: Secondary | ICD-10-CM | POA: Diagnosis not present

## 2019-10-03 DIAGNOSIS — D509 Iron deficiency anemia, unspecified: Secondary | ICD-10-CM | POA: Diagnosis not present

## 2019-10-03 DIAGNOSIS — D631 Anemia in chronic kidney disease: Secondary | ICD-10-CM | POA: Diagnosis not present

## 2019-10-03 DIAGNOSIS — N186 End stage renal disease: Secondary | ICD-10-CM | POA: Diagnosis not present

## 2019-10-03 DIAGNOSIS — Z992 Dependence on renal dialysis: Secondary | ICD-10-CM | POA: Diagnosis not present

## 2019-10-04 NOTE — H&P (Signed)
[place] [facility] History & Physical Update  The patient was interviewed and re-examined.  The patient's previous History and Physical has been reviewed and is unchanged.  There is no change in the plan of care. We plan to proceed with the scheduled procedure.  Hortencia Pilar, MD  10/04/2019, 11:10 AM

## 2019-10-05 ENCOUNTER — Telehealth (INDEPENDENT_AMBULATORY_CARE_PROVIDER_SITE_OTHER): Payer: Self-pay | Admitting: Nurse Practitioner

## 2019-10-05 DIAGNOSIS — Z992 Dependence on renal dialysis: Secondary | ICD-10-CM | POA: Diagnosis not present

## 2019-10-05 DIAGNOSIS — D509 Iron deficiency anemia, unspecified: Secondary | ICD-10-CM | POA: Diagnosis not present

## 2019-10-05 DIAGNOSIS — D631 Anemia in chronic kidney disease: Secondary | ICD-10-CM | POA: Diagnosis not present

## 2019-10-05 DIAGNOSIS — N186 End stage renal disease: Secondary | ICD-10-CM | POA: Diagnosis not present

## 2019-10-05 DIAGNOSIS — N2581 Secondary hyperparathyroidism of renal origin: Secondary | ICD-10-CM | POA: Diagnosis not present

## 2019-10-05 IMAGING — CR DG CHEST 2V
2 series · 2 of 2 positions shown · non-contrast
Comparison: Chest radiograph dated 04/30/2017

CLINICAL DATA: 61-year-old female with chest pain.

EXAM:
CHEST  2 VIEW

[w chest pa]
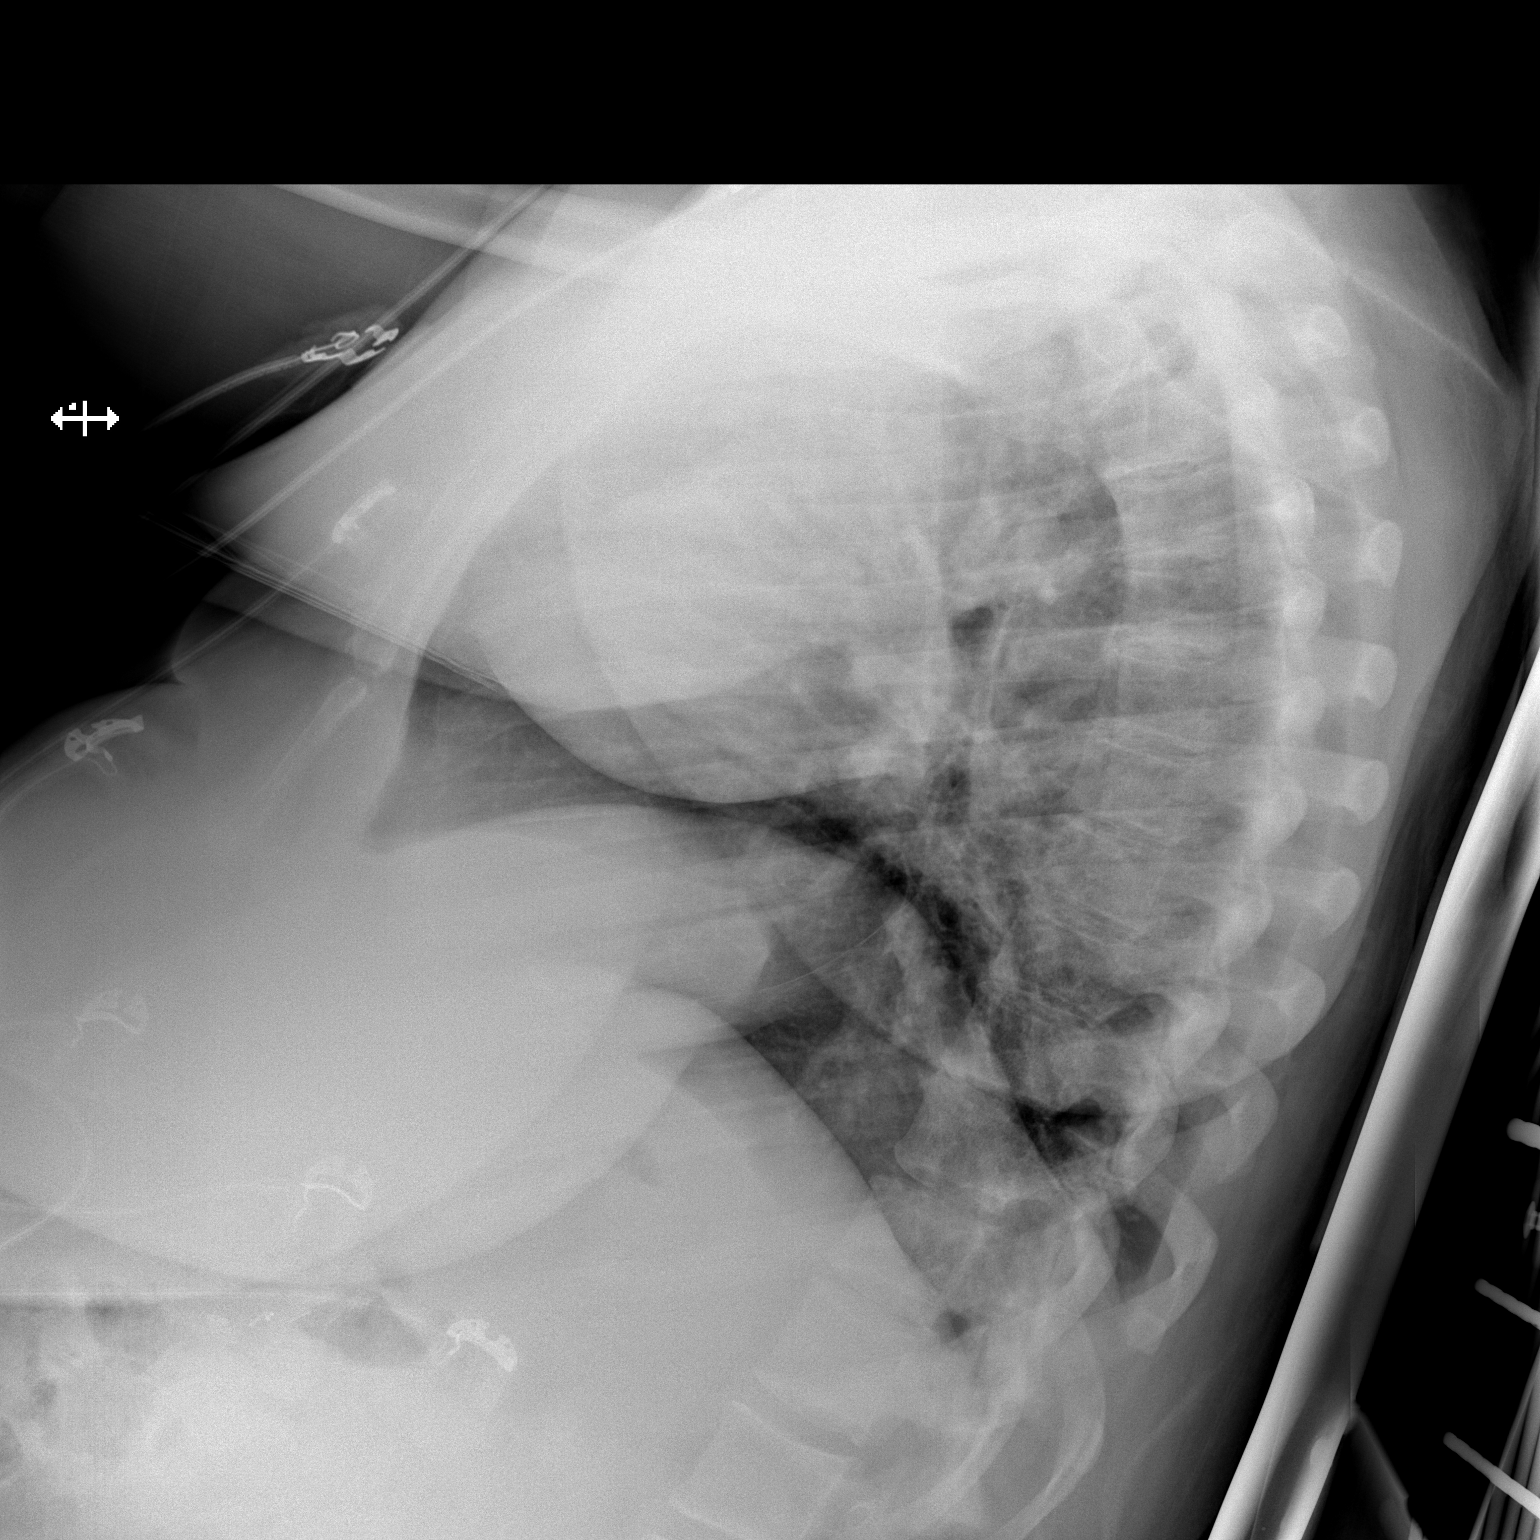

[x chest ap]
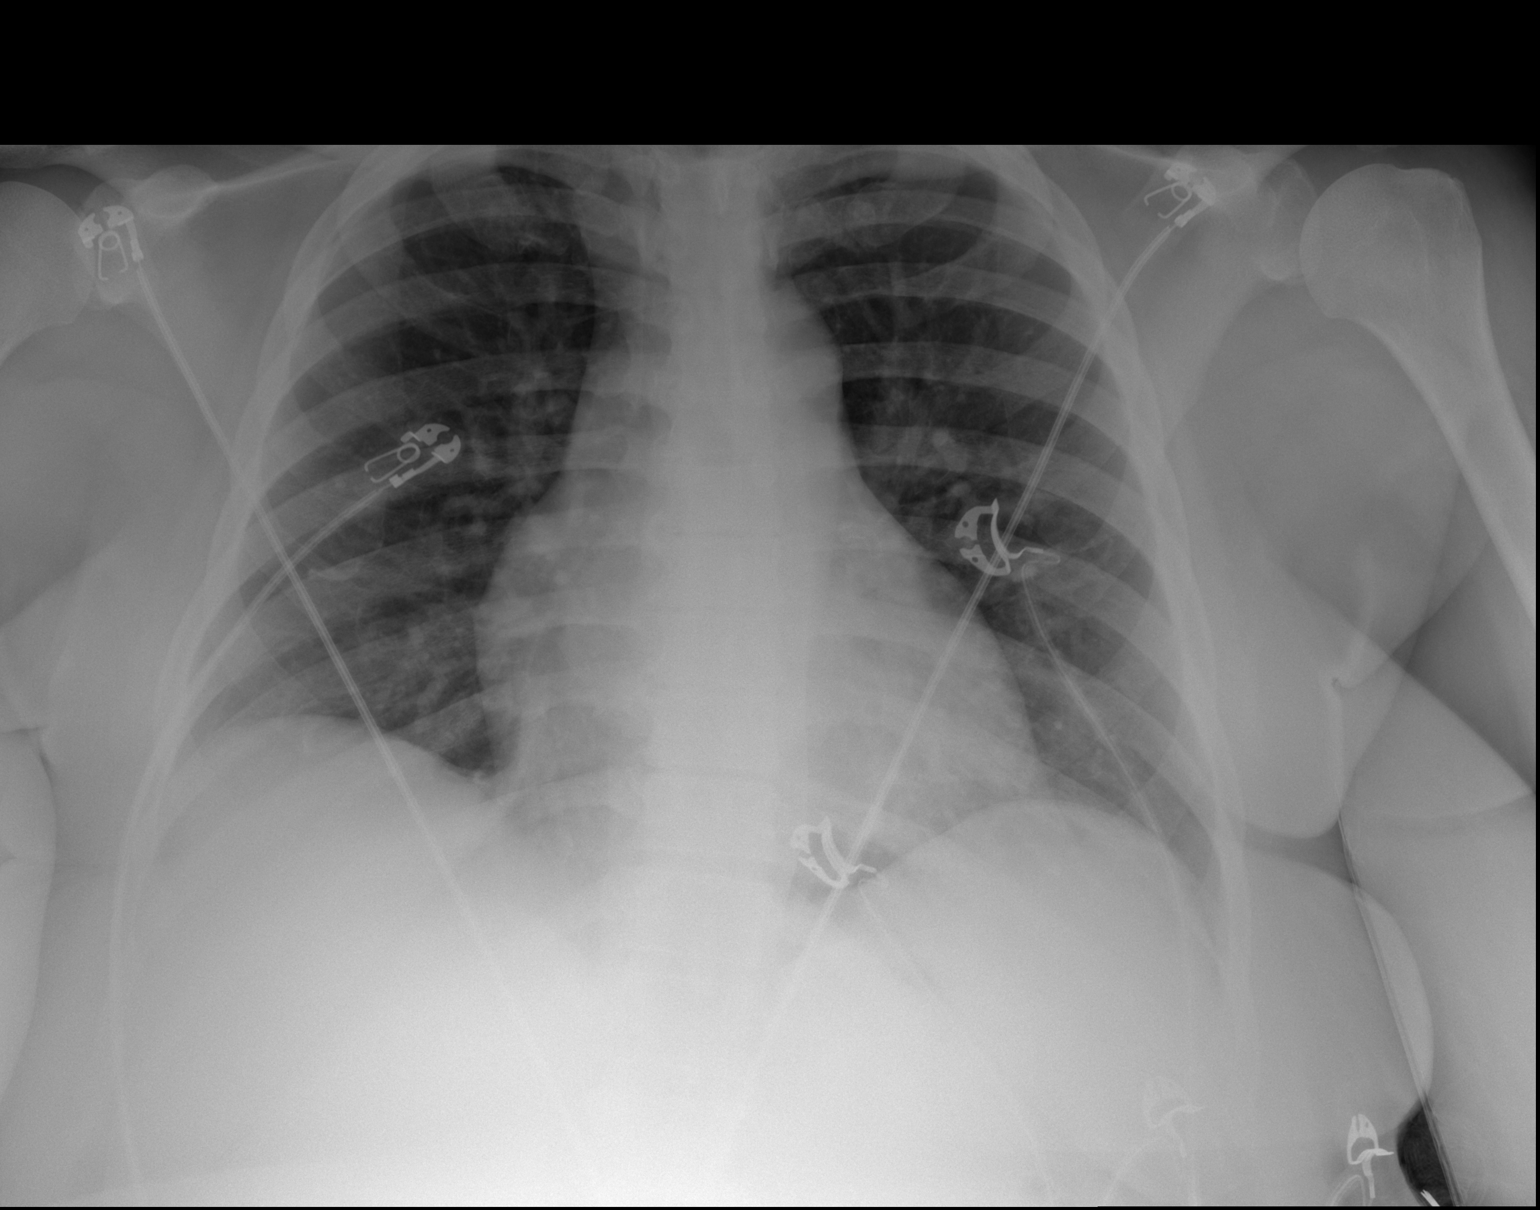

[2 of 2 positions shown; findings below may reference images not displayed]

FINDINGS: The heart size and mediastinal contours are within normal limits.
Both lungs are clear. The visualized skeletal structures are
unremarkable.
IMPRESSION: No active cardiopulmonary disease.

## 2019-10-06 ENCOUNTER — Other Ambulatory Visit: Payer: Self-pay | Admitting: Physician Assistant

## 2019-10-06 ENCOUNTER — Encounter: Payer: Self-pay | Admitting: Physician Assistant

## 2019-10-06 DIAGNOSIS — A6000 Herpesviral infection of urogenital system, unspecified: Secondary | ICD-10-CM

## 2019-10-06 NOTE — Telephone Encounter (Signed)
Requested Prescriptions  Pending Prescriptions Disp Refills  . valACYclovir (VALTREX) 500 MG tablet [Pharmacy Med Name: VALACYCLOVIR HCL 500 MG TABLET] 14 tablet 11    Sig: TAKE (1) TABLET BY MOUTH EVERY OTHER DAY.     Antimicrobials:  Antiviral Agents - Anti-Herpetic Passed - 10/06/2019  2:58 PM      Passed - Valid encounter within last 12 months    Recent Outpatient Visits          1 month ago Chest pain, unspecified type   [place], [place]   4 months ago Acute right-sided low back pain without sciatica   [place], [place]   7 months ago Chronic hepatitis C without hepatic coma [facility])   [place], Clearnce Sorrel, [place]   1 year ago ESRD on dialysis [facility])   [place], [place], [place]   1 year ago Diarrhea, unspecified type   [place], [place], [place]

## 2019-10-07 DIAGNOSIS — D509 Iron deficiency anemia, unspecified: Secondary | ICD-10-CM | POA: Diagnosis not present

## 2019-10-07 DIAGNOSIS — N2581 Secondary hyperparathyroidism of renal origin: Secondary | ICD-10-CM | POA: Diagnosis not present

## 2019-10-07 DIAGNOSIS — Z992 Dependence on renal dialysis: Secondary | ICD-10-CM | POA: Diagnosis not present

## 2019-10-07 DIAGNOSIS — N186 End stage renal disease: Secondary | ICD-10-CM | POA: Diagnosis not present

## 2019-10-07 DIAGNOSIS — D631 Anemia in chronic kidney disease: Secondary | ICD-10-CM | POA: Diagnosis not present

## 2019-10-10 ENCOUNTER — Other Ambulatory Visit: Payer: Self-pay | Admitting: Physician Assistant

## 2019-10-10 DIAGNOSIS — N186 End stage renal disease: Secondary | ICD-10-CM | POA: Diagnosis not present

## 2019-10-10 DIAGNOSIS — D631 Anemia in chronic kidney disease: Secondary | ICD-10-CM | POA: Diagnosis not present

## 2019-10-10 DIAGNOSIS — E1165 Type 2 diabetes mellitus with hyperglycemia: Secondary | ICD-10-CM

## 2019-10-10 DIAGNOSIS — N2581 Secondary hyperparathyroidism of renal origin: Secondary | ICD-10-CM | POA: Diagnosis not present

## 2019-10-10 DIAGNOSIS — Z992 Dependence on renal dialysis: Secondary | ICD-10-CM | POA: Diagnosis not present

## 2019-10-10 DIAGNOSIS — D509 Iron deficiency anemia, unspecified: Secondary | ICD-10-CM | POA: Diagnosis not present

## 2019-10-10 DIAGNOSIS — IMO0002 Reserved for concepts with insufficient information to code with codable children: Secondary | ICD-10-CM

## 2019-10-10 NOTE — Telephone Encounter (Signed)
Requested Prescriptions  Pending Prescriptions Disp Refills  . ULTICARE SHORT PEN NEEDLES 31G X 8 MM MISC [Pharmacy Med Name: ULTICARE PEN NEEDLES 8MM 31G] 100 each 5    Sig: USE THREE TIMES DAILY WITH INSULIN     Endocrinology: Diabetes - Testing Supplies Passed - 10/10/2019  4:50 PM      Passed - Valid encounter within last 12 months    Recent Outpatient Visits          2 months ago Chest pain, unspecified type   [place], [place]   4 months ago Acute right-sided low back pain without sciatica   [place], [place]   7 months ago Chronic hepatitis C without hepatic coma [facility])   [place], Clearnce Sorrel, PA-C   1 year ago ESRD on dialysis [facility])   [place], [place], [place]   1 year ago Diarrhea, unspecified type   [place], [place], [place]

## 2019-10-12 DIAGNOSIS — Z992 Dependence on renal dialysis: Secondary | ICD-10-CM | POA: Diagnosis not present

## 2019-10-12 DIAGNOSIS — N186 End stage renal disease: Secondary | ICD-10-CM | POA: Diagnosis not present

## 2019-10-12 DIAGNOSIS — E1122 Type 2 diabetes mellitus with diabetic chronic kidney disease: Secondary | ICD-10-CM | POA: Diagnosis not present

## 2019-10-14 DIAGNOSIS — Z992 Dependence on renal dialysis: Secondary | ICD-10-CM | POA: Diagnosis not present

## 2019-10-14 DIAGNOSIS — D631 Anemia in chronic kidney disease: Secondary | ICD-10-CM | POA: Diagnosis not present

## 2019-10-14 DIAGNOSIS — E1129 Type 2 diabetes mellitus with other diabetic kidney complication: Secondary | ICD-10-CM | POA: Diagnosis not present

## 2019-10-14 DIAGNOSIS — D509 Iron deficiency anemia, unspecified: Secondary | ICD-10-CM | POA: Diagnosis not present

## 2019-10-14 DIAGNOSIS — N2581 Secondary hyperparathyroidism of renal origin: Secondary | ICD-10-CM | POA: Diagnosis not present

## 2019-10-14 DIAGNOSIS — N186 End stage renal disease: Secondary | ICD-10-CM | POA: Diagnosis not present

## 2019-10-16 ENCOUNTER — Other Ambulatory Visit: Payer: Self-pay | Admitting: Physician Assistant

## 2019-10-16 ENCOUNTER — Ambulatory Visit (INDEPENDENT_AMBULATORY_CARE_PROVIDER_SITE_OTHER): Payer: Medicare Other | Admitting: Nurse Practitioner

## 2019-10-16 DIAGNOSIS — R197 Diarrhea, unspecified: Secondary | ICD-10-CM

## 2019-10-17 DIAGNOSIS — Z992 Dependence on renal dialysis: Secondary | ICD-10-CM | POA: Diagnosis not present

## 2019-10-17 DIAGNOSIS — E1129 Type 2 diabetes mellitus with other diabetic kidney complication: Secondary | ICD-10-CM | POA: Diagnosis not present

## 2019-10-17 DIAGNOSIS — D509 Iron deficiency anemia, unspecified: Secondary | ICD-10-CM | POA: Diagnosis not present

## 2019-10-17 DIAGNOSIS — N186 End stage renal disease: Secondary | ICD-10-CM | POA: Diagnosis not present

## 2019-10-17 DIAGNOSIS — N2581 Secondary hyperparathyroidism of renal origin: Secondary | ICD-10-CM | POA: Diagnosis not present

## 2019-10-17 DIAGNOSIS — D631 Anemia in chronic kidney disease: Secondary | ICD-10-CM | POA: Diagnosis not present

## 2019-10-18 ENCOUNTER — Ambulatory Visit (INDEPENDENT_AMBULATORY_CARE_PROVIDER_SITE_OTHER): Payer: Medicare Other | Admitting: Nurse Practitioner

## 2019-10-18 DIAGNOSIS — M25531 Pain in right wrist: Secondary | ICD-10-CM | POA: Diagnosis not present

## 2019-10-18 DIAGNOSIS — R202 Paresthesia of skin: Secondary | ICD-10-CM | POA: Diagnosis not present

## 2019-10-18 DIAGNOSIS — Z6841 Body Mass Index (BMI) 40.0 and over, adult: Secondary | ICD-10-CM | POA: Diagnosis not present

## 2019-10-18 DIAGNOSIS — M654 Radial styloid tenosynovitis [de Quervain]: Secondary | ICD-10-CM | POA: Diagnosis not present

## 2019-10-18 DIAGNOSIS — R200 Anesthesia of skin: Secondary | ICD-10-CM | POA: Diagnosis not present

## 2019-10-19 DIAGNOSIS — D509 Iron deficiency anemia, unspecified: Secondary | ICD-10-CM | POA: Diagnosis not present

## 2019-10-19 DIAGNOSIS — E1129 Type 2 diabetes mellitus with other diabetic kidney complication: Secondary | ICD-10-CM | POA: Diagnosis not present

## 2019-10-19 DIAGNOSIS — Z992 Dependence on renal dialysis: Secondary | ICD-10-CM | POA: Diagnosis not present

## 2019-10-19 DIAGNOSIS — D631 Anemia in chronic kidney disease: Secondary | ICD-10-CM | POA: Diagnosis not present

## 2019-10-19 DIAGNOSIS — N186 End stage renal disease: Secondary | ICD-10-CM | POA: Diagnosis not present

## 2019-10-19 DIAGNOSIS — N2581 Secondary hyperparathyroidism of renal origin: Secondary | ICD-10-CM | POA: Diagnosis not present

## 2019-10-21 ENCOUNTER — Other Ambulatory Visit: Payer: Self-pay

## 2019-10-21 ENCOUNTER — Encounter (HOSPITAL_COMMUNITY): Payer: Self-pay | Admitting: *Deleted

## 2019-10-21 ENCOUNTER — Emergency Department (HOSPITAL_COMMUNITY)
Admission: EM | Admit: 2019-10-21 | Discharge: 2019-10-21 | Disposition: A | Discharge status: Home / Self Care | Payer: Medicare Other | Attending: Emergency Medicine | Admitting: Emergency Medicine

## 2019-10-21 ENCOUNTER — Emergency Department (HOSPITAL_COMMUNITY): Payer: Medicare Other

## 2019-10-21 DIAGNOSIS — Z992 Dependence on renal dialysis: Secondary | ICD-10-CM | POA: Diagnosis not present

## 2019-10-21 DIAGNOSIS — Z87891 Personal history of nicotine dependence: Secondary | ICD-10-CM | POA: Insufficient documentation

## 2019-10-21 DIAGNOSIS — D631 Anemia in chronic kidney disease: Secondary | ICD-10-CM | POA: Diagnosis not present

## 2019-10-21 DIAGNOSIS — M79641 Pain in right hand: Secondary | ICD-10-CM

## 2019-10-21 DIAGNOSIS — R0602 Shortness of breath: Secondary | ICD-10-CM | POA: Diagnosis not present

## 2019-10-21 DIAGNOSIS — I2510 Atherosclerotic heart disease of native coronary artery without angina pectoris: Secondary | ICD-10-CM | POA: Diagnosis not present

## 2019-10-21 DIAGNOSIS — N2581 Secondary hyperparathyroidism of renal origin: Secondary | ICD-10-CM | POA: Diagnosis not present

## 2019-10-21 DIAGNOSIS — Z79899 Other long term (current) drug therapy: Secondary | ICD-10-CM | POA: Insufficient documentation

## 2019-10-21 DIAGNOSIS — I132 Hypertensive heart and chronic kidney disease with heart failure and with stage 5 chronic kidney disease, or end stage renal disease: Secondary | ICD-10-CM | POA: Insufficient documentation

## 2019-10-21 DIAGNOSIS — Z7982 Long term (current) use of aspirin: Secondary | ICD-10-CM | POA: Insufficient documentation

## 2019-10-21 DIAGNOSIS — E1129 Type 2 diabetes mellitus with other diabetic kidney complication: Secondary | ICD-10-CM | POA: Diagnosis not present

## 2019-10-21 DIAGNOSIS — M79674 Pain in right toe(s): Secondary | ICD-10-CM | POA: Diagnosis not present

## 2019-10-21 DIAGNOSIS — E1122 Type 2 diabetes mellitus with diabetic chronic kidney disease: Secondary | ICD-10-CM | POA: Insufficient documentation

## 2019-10-21 DIAGNOSIS — Z794 Long term (current) use of insulin: Secondary | ICD-10-CM | POA: Insufficient documentation

## 2019-10-21 DIAGNOSIS — I5042 Chronic combined systolic (congestive) and diastolic (congestive) heart failure: Secondary | ICD-10-CM | POA: Diagnosis not present

## 2019-10-21 DIAGNOSIS — D509 Iron deficiency anemia, unspecified: Secondary | ICD-10-CM | POA: Diagnosis not present

## 2019-10-21 DIAGNOSIS — N186 End stage renal disease: Secondary | ICD-10-CM | POA: Insufficient documentation

## 2019-10-21 LAB — COMPREHENSIVE METABOLIC PANEL
ALT: 39 U/L (ref 0–44)
AST: 39 U/L (ref 15–41)
Albumin: 3.6 g/dL (ref 3.5–5.0)
Alkaline Phosphatase: 295 U/L — ABNORMAL HIGH (ref 38–126)
Anion gap: 13 (ref 5–15)
BUN: 30 mg/dL — ABNORMAL HIGH (ref 8–23)
CO2: 26 mmol/L (ref 22–32)
Calcium: 9.5 mg/dL (ref 8.9–10.3)
Chloride: 97 mmol/L — ABNORMAL LOW (ref 98–111)
Creatinine, Ser: 4.82 mg/dL — ABNORMAL HIGH (ref 0.44–1.00)
GFR calc Af Amer: 11 mL/min — ABNORMAL LOW (ref >60)
GFR calc non Af Amer: 9 mL/min — ABNORMAL LOW (ref >60)
Glucose, Bld: 136 mg/dL — ABNORMAL HIGH (ref 70–99)
Potassium: 4.1 mmol/L (ref 3.5–5.1)
Sodium: 136 mmol/L (ref 135–145)
Total Bilirubin: 0.3 mg/dL (ref 0.3–1.2)
Total Protein: 7.7 g/dL (ref 6.5–8.1)

## 2019-10-21 LAB — CBC WITH DIFFERENTIAL/PLATELET
Abs Immature Granulocytes: 0.32 10*3/uL — ABNORMAL HIGH (ref 0.00–0.07)
Basophils Absolute: 0.1 10*3/uL (ref 0.0–0.1)
Basophils Relative: 1 %
Eosinophils Absolute: 0.2 10*3/uL (ref 0.0–0.5)
Eosinophils Relative: 2 %
HCT: 31 % — ABNORMAL LOW (ref 36.0–46.0)
Hemoglobin: 10.1 g/dL — ABNORMAL LOW (ref 12.0–15.0)
Immature Granulocytes: 3 %
Lymphocytes Relative: 14 %
Lymphs Abs: 1.4 10*3/uL (ref 0.7–4.0)
MCH: 33 pg (ref 26.0–34.0)
MCHC: 32.6 g/dL (ref 30.0–36.0)
MCV: 101.3 fL — ABNORMAL HIGH (ref 80.0–100.0)
Monocytes Absolute: 1 10*3/uL (ref 0.1–1.0)
Monocytes Relative: 10 %
Neutro Abs: 7 10*3/uL (ref 1.7–7.7)
Neutrophils Relative %: 70 %
Platelets: 184 10*3/uL (ref 150–400)
RBC: 3.06 MIL/uL — ABNORMAL LOW (ref 3.87–5.11)
RDW: 17.2 % — ABNORMAL HIGH (ref 11.5–15.5)
WBC: 10 10*3/uL (ref 4.0–10.5)
nRBC: 0 % (ref 0.0–0.2)

## 2019-10-21 LAB — BRAIN NATRIURETIC PEPTIDE: B Natriuretic Peptide: 99.3 pg/mL (ref 0.0–100.0)

## 2019-10-21 MED ORDER — ALBUTEROL SULFATE HFA 108 (90 BASE) MCG/ACT IN AERS: 2.0000 | INHALATION_SPRAY | RESPIRATORY_TRACT | 0 refills | Status: DC | PRN

## 2019-10-21 MED ORDER — OXYCODONE-ACETAMINOPHEN 5-325 MG PO TABS: 2.0000 | ORAL_TABLET | ORAL | 0 refills | Status: AC | PRN

## 2019-10-21 NOTE — ED Provider Notes (Signed)
[facility] DEPT Provider Note   CSN: [id number] Arrival date & time: 10/21/19  1030     History Chief Complaint  Patient presents with  . Asthma    [name] is a 64 y.o. female.  64 y.o female with a PMH of Asthma, CKD on dialysis THS last dialyzed this morning, left 4 minutes prior to treatment being completed presents tot he ED with a chief complaint of shortness of breath, right toe pain.  She reports she has been out of her inhaler for the past 6 weeks, has been using her nephew's inhaler while at home.  Reports she felt short of breath while at rest along with ambulation.  She has a prior history of CHF, feels like she is somewhat fluid overloaded, although she is currently not on fluid pills.  She does endorse some weight gain 8 pounds but an unknown time..  She also endorses a " little" nonproductive cough.  Patient states she was unable to see through all of dialysis treatment as her right toe pain was severe in nature.  She had the same issue several months back with her left toe, reports she saw podiatry who provided her with an injection.  States the pain is exacerbated with ambulation.  Has seen PCP via telephone but not in several months in office.  The history is provided by the patient.       Past Medical History:  Diagnosis Date  . Anemia   . Aortic stenosis    Echo 8/18: mean 13, peak 28, LVOT/AV mean velocity 0.51  . Arthritis   . Asthma    As a child   . Bronchitis   . CAD (coronary artery disease)    a. 09/2016: 50% Ost 1st Mrg stenosis, 50% 2nd Mrg stenosis, 20% Mid-Cx, 95% Prox LAD, 40% mid-LAD, and 10% dist-LAD stenosis. Staged PCI with DES to Prox-LAD.   [name] Chronic combined systolic and diastolic CHF (congestive heart failure) ([place]) 2011   echo 2/18: EF 55-60, normal wall motion, grade 2 diastolic dysfunction, trivial AI // echo 3/18: Septal and apical HK, EF 45-50, normal wall motion, trivial AI, mild LAE, PASP 38 // echo  8/18: EF 60-65, normal wall motion, grade 1 diastolic dysfunction, calcified aortic valve leaflets, mild aortic stenosis (mean 13, peak 28, LVOT/AV mean velocity 0.51), mild AI, moderate MAC, mild LAE, trivial TR   . Chronic kidney disease    STAGE 4  . Chronic kidney disease on chronic dialysis (HCC)    t, th, sat  . Complication of anesthesia   . Depression   . Diabetes mellitus Dx 1989  . Elevated lipids   . GERD (gastroesophageal reflux disease)   . Gout   . Heart murmur    asymptomatic  . Hepatitis C Dx 2013  . Hypertension Dx 1989  . Infected surgical wound    Lt arm  . Myocardial infarction ([place]) 07/2015  . Obesity   . Pancreatitis 2013  . Pneumonia   . Refusal of blood transfusions as patient is Jehovah's Witness   . Tendinitis   . Tremors of nervous system    LEFT HAND  . Ulcer 2010    Patient Active Problem List   Diagnosis Date Noted  . Ascending aortic aneurysm ([place]) 03/27/2019  . Mild protein-calorie malnutrition ([place]) 01/25/2019  . Puncture wound without foreign body of left forearm, initial encounter 01/02/2019  . Non-healing wound of upper extremity 12/28/2018  . Unspecified open wound of left  upper arm, initial encounter 12/06/2018  . Complication from renal dialysis device 10/20/2018  . Coagulation defect, unspecified ([place]) 10/18/2018  . Iron deficiency anemia, unspecified 10/18/2018  . Secondary hyperparathyroidism of renal origin ([place]) 10/18/2018  . ESRD on dialysis ([place]) 06/01/2018  . ARF (acute renal failure) ([place]) 05/09/2018  . Colon cancer screening   . LGSIL on Pap smear of cervix 01/27/2018  . Gout 12/25/2017  . AKI (acute kidney injury) ([place]) 12/24/2017  . Hyperglycemia 12/24/2017  . Chronic diastolic heart failure ([place]) 09/30/2017  . Lymphedema 09/30/2017  . Aortic stenosis   . Chronic pain of right knee 02/12/2017  . Chronic gout due to renal impairment of multiple sites without tophus 02/12/2017  . Esophageal dysphagia 02/12/2017  .  Osteoarthritis 01/22/2017  . Diabetic hyperosmolar non-ketotic state ([place]) 12/13/2016  . CAD (coronary artery disease) 12/13/2016  . Hyperlipidemia 12/13/2016  . Hyperphosphatemia 12/13/2016  . Asthma 11/29/2015  . Depression 09/05/2015  . GERD (gastroesophageal reflux disease) 03/25/2015  . Environmental allergies 03/14/2015  . Chronic hepatitis C without hepatic coma ([place]) 01/31/2015  . Diabetic neuropathy ([place]) 01/31/2015  . OSA (obstructive sleep apnea) 01/01/2015  . Poor dentition 11/13/2014  . Essential hypertension 10/08/2014  . Morbid (severe) obesity due to excess calories ([place]) 10/08/2014  . Diabetes mellitus type 2, uncontrolled, with complications ([place]) 38/45/3646    Class: Chronic    Past Surgical History:  Procedure Laterality Date  . A/V FISTULAGRAM Left 04/11/2019   Procedure: A/V FISTULAGRAM;  Surgeon: Katha Cabal, MD;  Location: [facility];  Service: Cardiovascular;  Laterality: Left;  . A/V FISTULAGRAM Left 06/02/2019   Procedure: A/V FISTULAGRAM;  Surgeon: Katha Cabal, MD;  Location: [facility] [place] [facility];  Service: Cardiovascular;  Laterality: Left;  . APPLICATION OF WOUND VAC Left 08/14/2017   Procedure: APPLICATION OF WOUND VAC Exchange;  Surgeon: Robert Bellow, MD;  Location: [facility];  Service: General;  Laterality: Left;  . APPLICATION OF WOUND VAC Left 12/21/2018   Procedure: APPLICATION OF WOUND VAC;  Surgeon: Katha Cabal, MD;  Location: [facility];  Service: Vascular;  Laterality: Left;  . AV FISTULA PLACEMENT Left 08/19/2018   Procedure: ARTERIOVENOUS (AV) FISTULA CREATION ( BRACHIOBASILIC );  Surgeon: Katha Cabal, MD;  Location: [facility];  Service: Vascular;  Laterality: Left;  . BASCILIC VEIN TRANSPOSITION Left 11/18/2018   Procedure: BASCILIC VEIN TRANSPOSITION;  Surgeon: Katha Cabal, MD;  Location: [facility];  Service: Vascular;  Laterality: Left;  . CHOLECYSTECTOMY    . COLONOSCOPY WITH PROPOFOL N/A 02/03/2018     Procedure: COLONOSCOPY WITH PROPOFOL;  Surgeon: Lin Landsman, MD;  Location: [facility];  Service: Gastroenterology;  Laterality: N/A;  . CORONARY ANGIOPLASTY  07/2015   STENT  . CORONARY STENT INTERVENTION N/A 09/18/2016   Procedure: Coronary Stent Intervention;  Surgeon: Troy Sine, MD;  Location: [facility];  Service: Cardiovascular;  Laterality: N/A;  . DIALYSIS/PERMA CATHETER INSERTION N/A 05/10/2018   Procedure: DIALYSIS/PERMA CATHETER INSERTION;  Surgeon: Katha Cabal, MD;  Location: [facility];  Service: Cardiovascular;  Laterality: N/A;  . DRESSING CHANGE UNDER ANESTHESIA Left 08/15/2017   Procedure: exploration of wound for bleeding;  Surgeon: Robert Bellow, MD;  Location: [facility];  Service: General;  Laterality: Left;  . ESOPHAGOGASTRODUODENOSCOPY (EGD) WITH PROPOFOL N/A 02/03/2018   Procedure: ESOPHAGOGASTRODUODENOSCOPY (EGD) WITH PROPOFOL;  Surgeon: Lin Landsman, MD;  Location: [facility]OS[facility];  Service: Gastroenterology;  Laterality: N/A;  . EYE  SURGERY  11/17/2018  . INCISION AND DRAINAGE ABSCESS Left 08/12/2017   Procedure: INCISION AND DRAINAGE ABSCESS;  Surgeon: Robert Bellow, MD;  Location: [facility];  Service: General;  Laterality: Left;  . KNEE ARTHROSCOPY    . LEFT HEART CATH N/A 09/18/2016   Procedure: Left Heart Cath;  Surgeon: Troy Sine, MD;  Location: [facility];  Service: Cardiovascular;  Laterality: N/A;  . LEFT HEART CATH AND CORONARY ANGIOGRAPHY N/A 09/16/2016   Procedure: Left Heart Cath and Coronary Angiography;  Surgeon: Burnell Blanks, MD;  Location: [facility];  Service: Cardiovascular;  Laterality: N/A;  . LEFT HEART CATH AND CORONARY ANGIOGRAPHY N/A 04/29/2017   Procedure: LEFT HEART CATH AND CORONARY ANGIOGRAPHY;  Surgeon: Nelva Bush, MD;  Location: [facility];  Service: Cardiovascular;  Laterality: N/A;  . LOWER EXTREMITY ANGIOGRAPHY Right 03/08/2018   Procedure:  LOWER EXTREMITY ANGIOGRAPHY;  Surgeon: Katha Cabal, MD;  Location: [facility];  Service: Cardiovascular;  Laterality: Right;  . TUBAL LIGATION    . TUBAL LIGATION    . UPPER EXTREMITY ANGIOGRAPHY Right 09/19/2019   Procedure: UPPER EXTREMITY ANGIOGRAPHY;  Surgeon: Katha Cabal, MD;  Location: [facility];  Service: Cardiovascular;  Laterality: Right;  . WOUND DEBRIDEMENT Left 12/21/2018   Procedure: DEBRIDEMENT WOUND;  Surgeon: Katha Cabal, MD;  Location: [facility];  Service: Vascular;  Laterality: Left;  . WOUND DEBRIDEMENT Left 12/30/2018   Procedure: DEBRIDEMENT WOUND WITH VAC PLACEMENT (LEFT UPPER EXTREMITY);  Surgeon: Katha Cabal, MD;  Location: [facility];  Service: Vascular;  Laterality: Left;     OB History    Gravida  4   Para  3   Term  3   Preterm      AB  1   Living  3     SAB  1   TAB      Ectopic      Multiple      Live Births  3           Family History  Problem Relation Age of Onset  . Colon cancer Mother   . Heart attack Other   . Heart attack Maternal Grandmother   . Hypertension Sister   . Hypertension Brother   . Diabetes Paternal Grandmother   . Breast cancer Neg Hx     Social History   Tobacco Use  . Smoking status: Former Smoker    Packs/day: 0.25    Years: 6.00    Pack years: 1.50    Types: Cigarettes    Quit date: 10/25/1980    Years since quitting: 39.0  . Smokeless tobacco: Never Used  Substance Use Topics  . Alcohol use: Yes    Comment: rarely  . Drug use: Yes    Types: Marijuana    Comment: Occasional marijuana use 08/21/2016 "no crack-clean since 05/1998"    Home Medications Prior to Admission medications   Medication Sig Start Date End Date Taking? Authorizing Provider  traMADol (ULTRAM) 50 MG tablet Take 1-2 tablets (50-100 mg total) by mouth daily as needed for moderate pain. MAY MAKE DROWSY 09/21/19   [name], PA-C  Accu-Chek FastClix Lancets MISC USE AS DIRECTED  TO CHECK BLOOD SUGAR THREE TIMES DAILY AND AT BEDTIME 09/28/18   [name], PA-C  albuterol (VENTOLIN HFA) 108 (90 Base) MCG/ACT inhaler Inhale 2 puffs into the lungs every 4 (four) hours as needed for wheezing or shortness  of breath. 10/21/19 11/20/19  Janeece Fitting, PA-C  allopurinol (ZYLOPRIM) 100 MG tablet TAKE 1 TABLET BY MOUTH ONCE DAILY. Patient taking differently: Take 100 mg by mouth daily.  06/16/19   [name], PA-C  aspirin EC 81 MG EC tablet Take 1 tablet (81 mg total) by mouth daily. 09/19/16   Strader, Fransisco Hertz, PA-C  atorvastatin (LIPITOR) 80 MG tablet TAKE 1 TABLET BY MOUTH ONCE DAILY AT 6PM Patient taking differently: Take 80 mg by mouth daily at 6 PM.  07/18/19   Burnette, Clearnce Sorrel, PA-C  AURYXIA 1 GM 210 MG(Fe) tablet Take 420 mg by mouth 3 (three) times daily with meals.  10/10/18   [provider]  b complex-vitamin c-folic acid (NEPHRO-VITE) 0.8 MG TABS tablet Take 1 tablet by mouth daily.    [provider]  carvedilol (COREG) 25 MG tablet TAKE (1) TABLET BY MOUTH TWICE A DAY WITH MEALS (BREAKFAST AND SUPPER) Patient taking differently: Take 25 mg by mouth 3 (three) times daily.  07/18/19   [name], PA-C  cephALEXin (KEFLEX) 500 MG capsule Take 1 capsule (500 mg total) by mouth 2 (two) times daily. Patient not taking: Reported on 09/18/2019 08/10/19   [name], PA-C  Continuous Blood Gluc Receiver (FREESTYLE LIBRE 14 DAY READER) DEVI 1 each by Does not apply route 4 (four) times daily -  before meals and at bedtime. 08/10/19   [name], PA-C  Continuous Blood Gluc Sensor (FREESTYLE LIBRE 14 DAY SENSOR) MISC 1 each by Does not apply route every 14 (fourteen) days. Check blood sugar ACHS 08/10/19   [name], PA-C  cyclobenzaprine (FLEXERIL) 5 MG tablet Take 1 tablet (5 mg total) by mouth 3 (three) times daily as needed for muscle spasms. 08/10/19   [name], PA-C  dicyclomine (BENTYL) 20 MG  tablet TAKE 1 TABLET(20 MG) BY MOUTH FOUR TIMES DAILY BEFORE MEALS AND AT BEDTIME 10/16/19   [name], PA-C  docusate sodium (COLACE) 100 MG capsule Take 1 capsule (100 mg total) by mouth 2 (two) times daily as needed for mild constipation. 09/20/17   Nicholes Mango, MD  doxycycline (VIBRA-TABS) 100 MG tablet Take 1 tablet (100 mg total) by mouth 2 (two) times daily. Patient not taking: Reported on 09/18/2019 08/21/19   Edrick Kins, DPM  fluticasone [facility]) 50 MCG/ACT nasal spray Place 2 sprays into both nostrils daily as needed for allergies or rhinitis. 09/28/17   [name], PA-C  gentamicin cream (GARAMYCIN) 0.1 % Apply 1 application topically 2 (two) times daily. Patient taking differently: Apply 1 application topically daily as needed (itching).  08/21/19   Edrick Kins, DPM  hydrALAZINE (APRESOLINE) 100 MG tablet TAKE (1) TABLET BY MOUTH THREE TIMES DAILY Patient taking differently: Take 100 mg by mouth 3 (three) times daily.  08/14/19   [name], PA-C  HYDROcodone-acetaminophen (NORCO/VICODIN) 5-325 MG tablet Take 1 tablet by mouth every 6 (six) hours as needed for moderate pain.    [provider]  hydrOXYzine (ATARAX/VISTARIL) 25 MG tablet Take 1 tablet (25 mg total) by mouth every 6 (six) hours as needed for itching. 12/07/18   Burnette, Clearnce Sorrel, PA-C  LEVEMIR FLEXTOUCH 100 UNIT/ML Pen INJECT 60 UNITS UNDER THE SKIN TWICE DAILY Patient taking differently: Inject 60 Units into the skin 2 (two) times daily.  08/17/19   [name], PA-C  lidocaine (LIDODERM) 5 % Place 1 patch onto the skin every 12 (twelve) hours.  Remove & Discard patch within 12 hours or as directed by MD Patient not taking: Reported on 09/18/2019 06/29/19 06/28/20  Blake Divine, MD  lidocaine-prilocaine (EMLA) cream Apply 1 application topically as needed. 03/27/19   Schnier, Dolores Lory, MD  liraglutide (VICTOZA) 18 MG/3ML SOPN Start with 0.85m daily and increase by 0.640mper  week until max dose of 1.8 mg dose achieved. Patient taking differently: Inject 0.6-1.8 mg into the skin See admin instructions. Start with 0.70m31maily and increase by 0.8mg93mr week until max dose of 1.2mg 41me achieved. 09/08/19   BurneMar DaringC  loratadine (CLARITIN) 10 MG tablet TAKE 1 TABLET BY MOUTH ONCE DAILY. Patient taking differently: Take 10 mg by mouth at bedtime.  08/11/19   BurneMar DaringC  methylPREDNISolone (MEDROL) 4 MG TBPK tablet 6 day taper; take as directed on package instructions Patient not taking: Reported on 09/18/2019 08/10/19   BurneMar DaringC  mometasone (ELOCON) 0.1 % cream APPLY AS DIRECTED TO AFFECTED AREA ONCE DAILY. Patient taking differently: Apply 1 application topically daily as needed.  12/08/18   BurneMar DaringC  mometasone-formoterol (DULERA) 200-5 MCG/ACT AERO Inhale 2 puffs into the lungs 2 (two) times daily. Patient not taking: Reported on 09/18/2019 08/03/18   BurneMar DaringC  montelukast (SINGULAIR) 10 MG tablet TAKE ONE TABLET BY MOUTH AT BEDTIME. Patient taking differently: Take 10 mg by mouth at bedtime.  08/11/19   BurneMar DaringC  nitroGLYCERIN (NITROSTAT) 0.4 MG SL tablet Place 1 tablet (0.4 mg total) under the tongue every 5 (five) minutes x 3 doses as needed for chest pain. 02/03/19   Bhagat, Bhavinkumar, PA  NOVOLOG FLEXPEN 100 UNIT/ML FlexPen USE 25 UNITS UNDER THE SKIN THREE TIMES DAILY WITH MEALS Patient taking differently: Inject 25 Units into the skin 3 (three) times daily with meals.  07/18/19   BurneMar DaringC  nystatin cream (MYCOSTATIN) Apply 1 application topically 2 (two) times daily. Patient taking differently: Apply 1 application topically daily as needed for dry skin.  08/10/19   BurneMar DaringC  omeprazole (PRILOSEC) 20 MG capsule TAKE (1) CAPSULE BY MOUTH ONCE DAILY. Patient taking differently: Take 20 mg by mouth every evening.  08/11/19   BurneMar DaringC  oxyCODONE-acetaminophen (PERCOCET/ROXICET) 5-325 MG tablet Take 2 tablets by mouth every 4 (four) hours as needed for up to 3 days for severe pain. 10/21/19 10/24/19  Varvara Legault,Janeece FittingC  pregabalin (LYRICA) 150 MG capsule Take 1 capsule (150 mg total) by mouth 2 (two) times daily. 08/10/19   Burnette, Jennifer M, PA-C  SENNA PLUS 8.6-50 MG tablet TAKE ONE TABLET BY MOUTH AT BEDTIME. Patient taking differently: Take 1 tablet by mouth at bedtime.  08/11/19   BurneMar DaringC  silver sulfADIAZINE (SILVADENE) 1 % cream Apply 1 application topically daily. Apply to right first toe Patient taking differently: Apply 1 application topically daily as needed (pain).  02/21/19   BrownKris Hartmann torsemide (DEMADEX) 20 MG tablet TAKE (2) TABLETS BY MOUTH TWICE DAILY. Patient taking differently: Take 40 mg by mouth 2 (two) times daily.  04/20/19   BurneMar DaringC  triamcinolone cream (KENALOG) 0.1 % APPLY TO AFFECTED AREA TWICE DAILY Patient not taking: Reported on 09/18/2019 06/17/18   BurneMar DaringC  ULTICARE SHORT PEN NEEDLES 31G X 8 MM MISC USE THREE TIMES DAILY WITH INSULIN 10/10/19   BurneMar DaringC  valACYclovir (VALTEstell Harpin  500 MG tablet TAKE (1) TABLET BY MOUTH EVERY OTHER DAY. 10/06/19   [name], PA-C  Vitamin D, Ergocalciferol, (DRISDOL) 1.25 MG (50000 UT) CAPS capsule TAKE 1 CAPSULE BY MOUTH ONCE A MONTH Patient taking differently: Take 50,000 Units by mouth every 30 (thirty) days.  06/16/19   [name], PA-C  gabapentin (NEURONTIN) 100 MG capsule Take 1 capsule (100 mg total) by mouth 3 (three) times daily. 08/19/17 02/03/19  [name], MD    Allergies    Shellfish allergy and Diazepam  Review of Systems   Review of Systems  Constitutional: Negative for chills and fever.  HENT: Negative for sore throat.   Respiratory: Positive for cough and shortness of breath.   Gastrointestinal: Negative for abdominal pain, nausea  and vomiting.  Genitourinary: Negative for flank pain.  Musculoskeletal: Positive for arthralgias. Negative for back pain.  Skin: Negative for pallor and wound.  Neurological: Negative for headaches.    Physical Exam Updated Vital Signs BP (!) 114/57   Pulse 83   Temp 98.2 F (36.8 C) (Oral)   Resp 20   SpO2 96%   Physical Exam Vitals and nursing note reviewed.  Constitutional:      Appearance: Normal appearance. She is obese.  HENT:     Head: Normocephalic and atraumatic.     Mouth/Throat:     Mouth: Mucous membranes are moist.  Eyes:     Pupils: Pupils are equal, round, and reactive to light.  Cardiovascular:     Rate and Rhythm: Normal rate.     Comments: No BL pitting edema.  Pulmonary:     Effort: Pulmonary effort is normal.     Breath sounds: No wheezing or rales.     Comments: No wheezing, rhonchi, rales.  Abdominal:     General: Abdomen is flat.     Palpations: Abdomen is soft.     Tenderness: There is no abdominal tenderness. There is no guarding.     Comments: Abdomen is soft to palpation.   Musculoskeletal:     Cervical back: Normal range of motion and neck supple.     Right lower leg: No edema.     Left lower leg: No edema.  Skin:    General: Skin is warm and dry.  Neurological:     Mental Status: She is alert and oriented to person, place, and time.     ED Results / Procedures / Treatments   Labs (all labs ordered are listed, but only abnormal results are displayed) Labs Reviewed  CBC WITH DIFFERENTIAL/PLATELET - Abnormal; Notable for the following components:      Result Value   RBC 3.06 (*)    Hemoglobin 10.1 (*)    HCT 31.0 (*)    MCV 101.3 (*)    RDW 17.2 (*)    Abs Immature Granulocytes 0.32 (*)    All other components within normal limits  COMPREHENSIVE METABOLIC PANEL - Abnormal; Notable for the following components:   Chloride 97 (*)    Glucose, Bld 136 (*)    BUN 30 (*)    Creatinine, Ser 4.82 (*)    Alkaline Phosphatase 295 (*)     GFR calc non Af Amer 9 (*)    GFR calc Af Amer 11 (*)    All other components within normal limits  BRAIN NATRIURETIC PEPTIDE    EKG None  Radiology DG Chest 2 View  Result Date: 10/21/2019 CLINICAL DATA:  Shortness of breath EXAM: CHEST - 2  VIEW COMPARISON:  12/29/2018 FINDINGS: Interval removal of hemodialysis catheter. The heart size and mediastinal contours are within normal limits. Mild streaky bibasilar opacities, right greater than left. No pleural effusion or pneumothorax. The visualized skeletal structures are unremarkable. IMPRESSION: Mild streaky bibasilar opacities, right greater than left, may represent atelectasis or infiltrate. Electronically Signed   By: Davina Poke D.O.   On: 10/21/2019 14:03   DG Foot 2 Views Right  Result Date: 10/21/2019 CLINICAL DATA:  Right great toe pain for 1 week EXAM: RIGHT FOOT - 2 VIEW COMPARISON:  02/17/2018, 615 FINDINGS: There is no evidence of acute fracture or dislocation. Unchanged chronic deformity at the fifth PIP joint. There is no evidence of arthropathy or other focal bone abnormality. Soft tissues are unremarkable. IMPRESSION: Negative. Electronically Signed   By: Davina Poke D.O.   On: 10/21/2019 14:05    Procedures Procedures (including critical care time)  Medications Ordered in ED Medications - No data to display  ED Course  I have reviewed the triage vital signs and the nursing notes.  Pertinent labs & imaging results that were available during my care of the patient were reviewed by me and considered in my medical decision making (see chart for details).  Clinical Course as of Oct 20 1448  Sat Oct 21, 2019  1423 After dialysis this morning  Creatinine(!): 4.82 [JS]  1423 Within her baseline.  Hemoglobin(!): 10.1 [JS]  1423 Normal limits  B Natriuretic Peptide: 99.3 [JS]    Clinical Course User Index [JS] Janeece Fitting, PA-C   MDM Rules/Calculators/A&P  Patient with a PMH of CKD, Asthma presents to  the ED s/p dialysis with complaints of shortness of breath, right great toe pain, right arm pain which has been going on for months.  According to patient, she was completing her dialysis treatment today, left with 4 minutes of spare due to her right great toe pain.  She also reports shortness of breath, this has been on and off for the last couple days, reports using her family members inhaler with improvement in her wheezing.  She also states her right great toe has been significantly painful in the past couple weeks, pain is exacerbated with ambulation.  She had a similar episode in the past with her left toe which was injected with likely cortisone by podiatry.  She also reports right arm pain, according to her chart which I have reviewed she was evaluated by orthopedics at [facility] 3 days ago, was diagnosed with de Quervain's tenosynovitis, was placed on a right arm splint along with had a cortisone injection.  She denies any improvement in her symptoms although this therapy was given 3 days ago.  Interpretation of her labs reveal a CBC without any leukocytosis, hemoglobin is at her baseline.  CMP without any electrolyte derangement, slight elevation of glucose, creatinine level slightly elevated but is within her baseline without any hyperkalemia postdialysis.  BNP is within normal limits, chest x-Villamor does not show any signs of pulmonary edema, lower suspicion that shortness of breath is originating from CHF exacerbation.  There was no wheezing auscultated during my exam, lower suspicion for asthma exacerbation.  I do see that patient was previously placed on an albuterol inhaler, this will be refilled for her to help with her shortness of breath that she is experiencing while at home. Chest xray showed:  Mild streaky bibasilar opacities, right greater than left, may  represent atelectasis or infiltrate.   These results were discussed with the patient,  we discussed antibiotic treatment at this time, she  does not have any fever, chills, cough although states a little.  I recommended a follow-up x-Avedisian with PCP.  An x-Yzaguirre of her right foot did not show any gouty lesions, on evaluation does not show any erythema, edema, changes in the skin lower suspicion for cellulitis.  X-[place]ak without any fracture or dislocation.  Discussed follow-up with podiatry as needed.  Patient is currently on tramadol according to her last PDMP for pain control along with gabapentin.  We discussed therapy to help with her pain symptoms.We have discussed short course of percocet to home with symptoms at home.  She will also need PCP follow-up, encouraged patient to schedule an in office visit, due to her extensive medical history I do not feel the patient should continue with telephone consults.  Patient otherwise in stable condition, vitals are within normal limits.  Return precautions discussed at length.   Portions of this note were generated with Lobbyist. Dictation errors may occur despite best attempts at proofreading.  Final Clinical Impression(s) / ED Diagnoses Final diagnoses:  Shortness of breath  Great toe pain, right  Right hand pain    Rx / DC Orders ED Discharge Orders         Ordered    albuterol (VENTOLIN HFA) 108 (90 Base) MCG/ACT inhaler  Every 4 hours PRN     10/21/19 1420    oxyCODONE-acetaminophen (PERCOCET/ROXICET) 5-325 MG tablet  Every 4 hours PRN     10/21/19 1439           Janeece Fitting, PA-C 10/21/19 1450    Milton Ferguson, MD 10/22/19 [phone]

## 2019-10-21 NOTE — ED Triage Notes (Signed)
Pt states she is short of breath for the past week and feels like she needs an inhaler. She states she ran out of her inhaler a couple of months ago. Pt also reports wheezing.

## 2019-10-21 NOTE — Discharge Instructions (Addendum)
I have prescribed a short course of medication for pain control.  We discussed follow-up of your chest x-[place] if you develop any fever, worsening symptoms follow up with your primary care physician.  You may also obtain further follow-up of your right toe pain with podiatry.

## 2019-10-23 ENCOUNTER — Telehealth: Payer: Self-pay | Admitting: Podiatry

## 2019-10-23 ENCOUNTER — Encounter: Payer: Self-pay | Admitting: Radiology

## 2019-10-23 NOTE — Telephone Encounter (Signed)
Pt called stating that she has pain and would like something fore pain pils or cream pt stated something to help with pain. Please advise

## 2019-10-23 NOTE — Telephone Encounter (Signed)
Spoke with patient and advised she needs an office visit to be assessed.

## 2019-10-24 DIAGNOSIS — Z992 Dependence on renal dialysis: Secondary | ICD-10-CM | POA: Diagnosis not present

## 2019-10-24 DIAGNOSIS — E1129 Type 2 diabetes mellitus with other diabetic kidney complication: Secondary | ICD-10-CM | POA: Diagnosis not present

## 2019-10-24 DIAGNOSIS — D631 Anemia in chronic kidney disease: Secondary | ICD-10-CM | POA: Diagnosis not present

## 2019-10-24 DIAGNOSIS — N2581 Secondary hyperparathyroidism of renal origin: Secondary | ICD-10-CM | POA: Diagnosis not present

## 2019-10-24 DIAGNOSIS — N186 End stage renal disease: Secondary | ICD-10-CM | POA: Diagnosis not present

## 2019-10-24 DIAGNOSIS — D509 Iron deficiency anemia, unspecified: Secondary | ICD-10-CM | POA: Diagnosis not present

## 2019-10-26 DIAGNOSIS — D509 Iron deficiency anemia, unspecified: Secondary | ICD-10-CM | POA: Diagnosis not present

## 2019-10-26 DIAGNOSIS — D631 Anemia in chronic kidney disease: Secondary | ICD-10-CM | POA: Diagnosis not present

## 2019-10-26 DIAGNOSIS — N186 End stage renal disease: Secondary | ICD-10-CM | POA: Diagnosis not present

## 2019-10-26 DIAGNOSIS — E1129 Type 2 diabetes mellitus with other diabetic kidney complication: Secondary | ICD-10-CM | POA: Diagnosis not present

## 2019-10-26 DIAGNOSIS — Z992 Dependence on renal dialysis: Secondary | ICD-10-CM | POA: Diagnosis not present

## 2019-10-26 DIAGNOSIS — N2581 Secondary hyperparathyroidism of renal origin: Secondary | ICD-10-CM | POA: Diagnosis not present

## 2019-10-31 ENCOUNTER — Observation Stay (HOSPITAL_COMMUNITY)
Admission: EM | Admit: 2019-10-31 | Discharge: 2019-11-01 | Disposition: A | Discharge status: Home / Self Care | Payer: Medicare Other | Attending: Internal Medicine | Admitting: Internal Medicine

## 2019-10-31 ENCOUNTER — Emergency Department (HOSPITAL_COMMUNITY): Payer: Medicare Other

## 2019-10-31 ENCOUNTER — Encounter (HOSPITAL_COMMUNITY): Payer: Self-pay

## 2019-10-31 ENCOUNTER — Other Ambulatory Visit: Payer: Self-pay

## 2019-10-31 DIAGNOSIS — Z20822 Contact with and (suspected) exposure to covid-19: Secondary | ICD-10-CM | POA: Diagnosis not present

## 2019-10-31 DIAGNOSIS — E1140 Type 2 diabetes mellitus with diabetic neuropathy, unspecified: Secondary | ICD-10-CM | POA: Insufficient documentation

## 2019-10-31 DIAGNOSIS — E785 Hyperlipidemia, unspecified: Secondary | ICD-10-CM | POA: Insufficient documentation

## 2019-10-31 DIAGNOSIS — N250 Renal osteodystrophy: Secondary | ICD-10-CM | POA: Diagnosis not present

## 2019-10-31 DIAGNOSIS — M25561 Pain in right knee: Secondary | ICD-10-CM | POA: Diagnosis not present

## 2019-10-31 DIAGNOSIS — W1839XA Other fall on same level, initial encounter: Secondary | ICD-10-CM | POA: Diagnosis not present

## 2019-10-31 DIAGNOSIS — Z7982 Long term (current) use of aspirin: Secondary | ICD-10-CM | POA: Insufficient documentation

## 2019-10-31 DIAGNOSIS — Z992 Dependence on renal dialysis: Secondary | ICD-10-CM | POA: Diagnosis not present

## 2019-10-31 DIAGNOSIS — J449 Chronic obstructive pulmonary disease, unspecified: Secondary | ICD-10-CM | POA: Diagnosis not present

## 2019-10-31 DIAGNOSIS — M1990 Unspecified osteoarthritis, unspecified site: Secondary | ICD-10-CM | POA: Insufficient documentation

## 2019-10-31 DIAGNOSIS — R0602 Shortness of breath: Secondary | ICD-10-CM | POA: Diagnosis not present

## 2019-10-31 DIAGNOSIS — N289 Disorder of kidney and ureter, unspecified: Secondary | ICD-10-CM

## 2019-10-31 DIAGNOSIS — I2510 Atherosclerotic heart disease of native coronary artery without angina pectoris: Secondary | ICD-10-CM | POA: Insufficient documentation

## 2019-10-31 DIAGNOSIS — G4733 Obstructive sleep apnea (adult) (pediatric): Secondary | ICD-10-CM | POA: Insufficient documentation

## 2019-10-31 DIAGNOSIS — E1122 Type 2 diabetes mellitus with diabetic chronic kidney disease: Secondary | ICD-10-CM

## 2019-10-31 DIAGNOSIS — I132 Hypertensive heart and chronic kidney disease with heart failure and with stage 5 chronic kidney disease, or end stage renal disease: Secondary | ICD-10-CM

## 2019-10-31 DIAGNOSIS — R531 Weakness: Secondary | ICD-10-CM

## 2019-10-31 DIAGNOSIS — I5030 Unspecified diastolic (congestive) heart failure: Secondary | ICD-10-CM

## 2019-10-31 DIAGNOSIS — F329 Major depressive disorder, single episode, unspecified: Secondary | ICD-10-CM | POA: Insufficient documentation

## 2019-10-31 DIAGNOSIS — Z79899 Other long term (current) drug therapy: Secondary | ICD-10-CM | POA: Insufficient documentation

## 2019-10-31 DIAGNOSIS — E8770 Fluid overload, unspecified: Secondary | ICD-10-CM

## 2019-10-31 DIAGNOSIS — Z91013 Allergy to seafood: Secondary | ICD-10-CM

## 2019-10-31 DIAGNOSIS — G8929 Other chronic pain: Secondary | ICD-10-CM

## 2019-10-31 DIAGNOSIS — E669 Obesity, unspecified: Secondary | ICD-10-CM | POA: Diagnosis not present

## 2019-10-31 DIAGNOSIS — Z794 Long term (current) use of insulin: Secondary | ICD-10-CM | POA: Diagnosis not present

## 2019-10-31 DIAGNOSIS — R197 Diarrhea, unspecified: Secondary | ICD-10-CM | POA: Insufficient documentation

## 2019-10-31 DIAGNOSIS — I5032 Chronic diastolic (congestive) heart failure: Secondary | ICD-10-CM | POA: Diagnosis not present

## 2019-10-31 DIAGNOSIS — Z9115 Patient's noncompliance with renal dialysis: Secondary | ICD-10-CM | POA: Diagnosis not present

## 2019-10-31 DIAGNOSIS — N2581 Secondary hyperparathyroidism of renal origin: Secondary | ICD-10-CM | POA: Insufficient documentation

## 2019-10-31 DIAGNOSIS — Z7951 Long term (current) use of inhaled steroids: Secondary | ICD-10-CM | POA: Insufficient documentation

## 2019-10-31 DIAGNOSIS — M109 Gout, unspecified: Secondary | ICD-10-CM | POA: Diagnosis not present

## 2019-10-31 DIAGNOSIS — E8889 Other specified metabolic disorders: Secondary | ICD-10-CM | POA: Diagnosis not present

## 2019-10-31 DIAGNOSIS — Z888 Allergy status to other drugs, medicaments and biological substances status: Secondary | ICD-10-CM | POA: Diagnosis not present

## 2019-10-31 DIAGNOSIS — I959 Hypotension, unspecified: Secondary | ICD-10-CM | POA: Diagnosis not present

## 2019-10-31 DIAGNOSIS — E8779 Other fluid overload: Secondary | ICD-10-CM

## 2019-10-31 DIAGNOSIS — I5042 Chronic combined systolic (congestive) and diastolic (congestive) heart failure: Secondary | ICD-10-CM | POA: Diagnosis not present

## 2019-10-31 DIAGNOSIS — Z955 Presence of coronary angioplasty implant and graft: Secondary | ICD-10-CM | POA: Insufficient documentation

## 2019-10-31 DIAGNOSIS — K219 Gastro-esophageal reflux disease without esophagitis: Secondary | ICD-10-CM | POA: Diagnosis not present

## 2019-10-31 DIAGNOSIS — I714 Abdominal aortic aneurysm, without rupture: Secondary | ICD-10-CM | POA: Insufficient documentation

## 2019-10-31 DIAGNOSIS — R140 Abdominal distension (gaseous): Secondary | ICD-10-CM | POA: Diagnosis not present

## 2019-10-31 DIAGNOSIS — N186 End stage renal disease: Secondary | ICD-10-CM | POA: Diagnosis not present

## 2019-10-31 DIAGNOSIS — E1129 Type 2 diabetes mellitus with other diabetic kidney complication: Secondary | ICD-10-CM | POA: Diagnosis not present

## 2019-10-31 DIAGNOSIS — R609 Edema, unspecified: Secondary | ICD-10-CM | POA: Diagnosis not present

## 2019-10-31 DIAGNOSIS — Z79891 Long term (current) use of opiate analgesic: Secondary | ICD-10-CM | POA: Insufficient documentation

## 2019-10-31 DIAGNOSIS — D631 Anemia in chronic kidney disease: Secondary | ICD-10-CM | POA: Diagnosis not present

## 2019-10-31 DIAGNOSIS — Z87891 Personal history of nicotine dependence: Secondary | ICD-10-CM | POA: Insufficient documentation

## 2019-10-31 HISTORY — DX: Fluid overload, unspecified: E87.70

## 2019-10-31 LAB — GLUCOSE, CAPILLARY: Glucose-Capillary: 200 mg/dL — ABNORMAL HIGH (ref 70–99)

## 2019-10-31 LAB — BASIC METABOLIC PANEL
Anion gap: 17 — ABNORMAL HIGH (ref 5–15)
BUN: 126 mg/dL — ABNORMAL HIGH (ref 8–23)
CO2: 16 mmol/L — ABNORMAL LOW (ref 22–32)
Calcium: 7.9 mg/dL — ABNORMAL LOW (ref 8.9–10.3)
Chloride: 101 mmol/L (ref 98–111)
Creatinine, Ser: 9.79 mg/dL — ABNORMAL HIGH (ref 0.44–1.00)
GFR calc Af Amer: 4 mL/min — ABNORMAL LOW (ref >60)
GFR calc non Af Amer: 4 mL/min — ABNORMAL LOW (ref >60)
Glucose, Bld: 271 mg/dL — ABNORMAL HIGH (ref 70–99)
Potassium: 5.1 mmol/L (ref 3.5–5.1)
Sodium: 134 mmol/L — ABNORMAL LOW (ref 135–145)

## 2019-10-31 LAB — CBC
HCT: 29.5 % — ABNORMAL LOW (ref 36.0–46.0)
Hemoglobin: 9.3 g/dL — ABNORMAL LOW (ref 12.0–15.0)
MCH: 31.8 pg (ref 26.0–34.0)
MCHC: 31.5 g/dL (ref 30.0–36.0)
MCV: 101 fL — ABNORMAL HIGH (ref 80.0–100.0)
Platelets: 168 10*3/uL (ref 150–400)
RBC: 2.92 MIL/uL — ABNORMAL LOW (ref 3.87–5.11)
RDW: 16.2 % — ABNORMAL HIGH (ref 11.5–15.5)
WBC: 8.7 10*3/uL (ref 4.0–10.5)
nRBC: 0 % (ref 0.0–0.2)

## 2019-10-31 LAB — TROPONIN I (HIGH SENSITIVITY)
Troponin I (High Sensitivity): 9 ng/L (ref <18)
Troponin I (High Sensitivity): 8 ng/L (ref <18)

## 2019-10-31 LAB — BRAIN NATRIURETIC PEPTIDE: B Natriuretic Peptide: 236.5 pg/mL — ABNORMAL HIGH (ref 0.0–100.0)

## 2019-10-31 LAB — RESPIRATORY PANEL BY RT PCR (FLU A&B, COVID)
Influenza A by PCR: NEGATIVE
Influenza B by PCR: NEGATIVE
SARS Coronavirus 2 by RT PCR: NEGATIVE

## 2019-10-31 MED ORDER — ACETAMINOPHEN 325 MG PO TABS
650.0000 mg | ORAL_TABLET | Freq: Four times a day (QID) | ORAL | Status: DC | PRN
  Administered 2019-11-01: 650 mg via ORAL
  Filled 2019-10-31: qty 2

## 2019-10-31 MED ORDER — INSULIN ASPART 100 UNIT/ML FLEXPEN: 10.0000 [IU] | PEN_INJECTOR | Freq: Three times a day (TID) | SUBCUTANEOUS | Status: DC

## 2019-10-31 MED ORDER — CHLORHEXIDINE GLUCONATE CLOTH 2 % EX PADS: 6.0000 | MEDICATED_PAD | Freq: Every day | CUTANEOUS | Status: DC

## 2019-10-31 MED ORDER — HYDRALAZINE HCL 50 MG PO TABS
100.0000 mg | ORAL_TABLET | Freq: Three times a day (TID) | ORAL | Status: DC
  Administered 2019-10-31 – 2019-11-01 (×2): 100 mg via ORAL
  Filled 2019-10-31 (×4): qty 2

## 2019-10-31 MED ORDER — HEPARIN SODIUM (PORCINE) 5000 UNIT/ML IJ SOLN
5000.0000 [IU] | Freq: Three times a day (TID) | INTRAMUSCULAR | Status: DC
  Administered 2019-10-31 – 2019-11-01 (×2): 5000 [IU] via SUBCUTANEOUS
  Filled 2019-10-31 (×2): qty 1

## 2019-10-31 MED ORDER — INSULIN ASPART 100 UNIT/ML ~~LOC~~ SOLN
10.0000 [IU] | Freq: Three times a day (TID) | SUBCUTANEOUS | Status: DC
  Administered 2019-11-01 (×2): 10 [IU] via SUBCUTANEOUS

## 2019-10-31 MED ORDER — INSULIN ASPART 100 UNIT/ML ~~LOC~~ SOLN
0.0000 [IU] | Freq: Three times a day (TID) | SUBCUTANEOUS | Status: DC
  Administered 2019-11-01: 8 [IU] via SUBCUTANEOUS
  Administered 2019-11-01: 15 [IU] via SUBCUTANEOUS

## 2019-10-31 MED ORDER — MOMETASONE FURO-FORMOTEROL FUM 200-5 MCG/ACT IN AERO
2.0000 | INHALATION_SPRAY | Freq: Two times a day (BID) | RESPIRATORY_TRACT | Status: DC
  Administered 2019-11-01: 2 via RESPIRATORY_TRACT
  Filled 2019-10-31: qty 8.8

## 2019-10-31 MED ORDER — ONDANSETRON HCL 4 MG PO TABS: 4.0000 mg | ORAL_TABLET | Freq: Four times a day (QID) | ORAL | Status: DC | PRN

## 2019-10-31 MED ORDER — ALBUTEROL SULFATE (2.5 MG/3ML) 0.083% IN NEBU: 2.5000 mg | INHALATION_SOLUTION | RESPIRATORY_TRACT | Status: DC | PRN

## 2019-10-31 MED ORDER — ONDANSETRON HCL 4 MG/2ML IJ SOLN: 4.0000 mg | Freq: Four times a day (QID) | INTRAMUSCULAR | Status: DC | PRN

## 2019-10-31 MED ORDER — HYDROXYZINE HCL 25 MG PO TABS: 25.0000 mg | ORAL_TABLET | Freq: Four times a day (QID) | ORAL | Status: DC | PRN

## 2019-10-31 MED ORDER — NITROGLYCERIN 0.4 MG SL SUBL: 0.4000 mg | SUBLINGUAL_TABLET | SUBLINGUAL | Status: DC | PRN

## 2019-10-31 MED ORDER — ALBUTEROL SULFATE HFA 108 (90 BASE) MCG/ACT IN AERS
8.0000 | INHALATION_SPRAY | Freq: Once | RESPIRATORY_TRACT | Status: AC
  Administered 2019-10-31: 8 via RESPIRATORY_TRACT
  Filled 2019-10-31: qty 6.7

## 2019-10-31 MED ORDER — ATORVASTATIN CALCIUM 80 MG PO TABS: 80.0000 mg | ORAL_TABLET | Freq: Every day | ORAL | Status: DC

## 2019-10-31 MED ORDER — ACETAMINOPHEN 650 MG RE SUPP: 650.0000 mg | Freq: Four times a day (QID) | RECTAL | Status: DC | PRN

## 2019-10-31 MED ORDER — POLYETHYLENE GLYCOL 3350 17 G PO PACK: 17.0000 g | PACK | Freq: Every day | ORAL | Status: DC | PRN

## 2019-10-31 MED ORDER — INSULIN GLARGINE 100 UNIT/ML ~~LOC~~ SOLN
48.0000 [IU] | Freq: Two times a day (BID) | SUBCUTANEOUS | Status: DC
  Administered 2019-10-31 – 2019-11-01 (×2): 48 [IU] via SUBCUTANEOUS
  Filled 2019-10-31 (×3): qty 0.48

## 2019-10-31 MED ORDER — ASPIRIN EC 81 MG PO TBEC
81.0000 mg | DELAYED_RELEASE_TABLET | Freq: Every day | ORAL | Status: DC
  Administered 2019-11-01: 81 mg via ORAL
  Filled 2019-10-31 (×2): qty 1

## 2019-10-31 MED ORDER — INSULIN ASPART 100 UNIT/ML ~~LOC~~ SOLN: 0.0000 [IU] | Freq: Every day | SUBCUTANEOUS | Status: DC

## 2019-10-31 MED ORDER — HEPARIN SODIUM (PORCINE) 1000 UNIT/ML IJ SOLN
INTRAMUSCULAR | Status: AC
  Administered 2019-10-31: 6000 [IU] via INTRAVENOUS_CENTRAL
  Filled 2019-10-31: qty 6

## 2019-10-31 MED ORDER — CINACALCET HCL 30 MG PO TABS: 60.0000 mg | ORAL_TABLET | ORAL | Status: DC

## 2019-10-31 MED ORDER — DOXERCALCIFEROL 4 MCG/2ML IV SOLN: 8.0000 ug | INTRAVENOUS | Status: DC

## 2019-10-31 MED ORDER — PANTOPRAZOLE SODIUM 40 MG PO TBEC
40.0000 mg | DELAYED_RELEASE_TABLET | Freq: Every day | ORAL | Status: DC
  Administered 2019-11-01: 40 mg via ORAL
  Filled 2019-10-31: qty 1

## 2019-10-31 MED ORDER — CARVEDILOL 25 MG PO TABS
25.0000 mg | ORAL_TABLET | Freq: Two times a day (BID) | ORAL | Status: DC
  Administered 2019-10-31 – 2019-11-01 (×2): 25 mg via ORAL
  Filled 2019-10-31 (×2): qty 1

## 2019-10-31 NOTE — Consult Note (Signed)
[facility] Renal Consultation Note    Indication for Consultation:  Management of ESRD/hemodialysis; anemia, hypertension/volume and secondary hyperparathyroidism PCP:  HPI: [name] is a 64 y.o. female with ESRD on hemodialysis T,Th,S at [facility]. PMH: Type 2 DM, HTN, hyperlipidemia, obesity, Hx aortic stenosis, CAD (s/p stents), combined HF, Hep C, depression, OSA, Hx gout, GERD, tremors, Hx pancreatitis, AOCD, SHPT.  Last HD 10/26/2019, got to EDW. H/O high gains over weekend.   Patient presented to ED after fall this AM getting on bus to go to HD and increased SOB. Patient missed HD 10/28/2019. Labs: Na 134 K+ 5.1 CO2 16 BS 271 SCr 9.79 BUN 126 HGB 9.3 WBC 8.7 PLT 168. EKG NSR 70s. CXR unremarkable. Xray R knee unremarkable.  She C/Os of SOB at rest, breathless she speaks, mild WOB, dyspnea on minimal exertion.. Has albuterol inhaler in her hand which she says "they gave her" which has helped some. Tenderness R knee. Did not strike her head or loss consciousness with fall. Will have urgent HD for volume removal today.    Past Medical History:  Diagnosis Date  . Anemia   . Aortic stenosis    Echo 8/18: mean 13, peak 28, LVOT/AV mean velocity 0.51  . Arthritis   . Asthma    As a child   . Bronchitis   . CAD (coronary artery disease)    a. 09/2016: 50% Ost 1st Mrg stenosis, 50% 2nd Mrg stenosis, 20% Mid-Cx, 95% Prox LAD, 40% mid-LAD, and 10% dist-LAD stenosis. Staged PCI with DES to Prox-LAD.   [name] Chronic combined systolic and diastolic CHF (congestive heart failure) ([place]) 2011   echo 2/18: EF 55-60, normal wall motion, grade 2 diastolic dysfunction, trivial AI // echo 3/18: Septal and apical HK, EF 45-50, normal wall motion, trivial AI, mild LAE, PASP 38 // echo 8/18: EF 60-65, normal wall motion, grade 1 diastolic dysfunction, calcified aortic valve leaflets, mild aortic stenosis (mean 13, peak 28, LVOT/AV mean velocity 0.51), mild AI, moderate  MAC, mild LAE, trivial TR   . Chronic kidney disease    STAGE 4  . Chronic kidney disease on chronic dialysis (HCC)    t, th, sat  . Complication of anesthesia   . Depression   . Diabetes mellitus Dx 1989  . Elevated lipids   . GERD (gastroesophageal reflux disease)   . Gout   . Heart murmur    asymptomatic  . Hepatitis C Dx 2013  . Hypertension Dx 1989  . Infected surgical wound    Lt arm  . Myocardial infarction ([place]) 07/2015  . Obesity   . Pancreatitis 2013  . Pneumonia   . Refusal of blood transfusions as patient is Jehovah's Witness   . Tendinitis   . Tremors of nervous system    LEFT HAND  . Ulcer 2010   Past Surgical History:  Procedure Laterality Date  . A/V FISTULAGRAM Left 04/11/2019   Procedure: A/V FISTULAGRAM;  Surgeon: Katha Cabal, MD;  Location: [place] [facility];  Service: Cardiovascular;  Laterality: Left;  . A/V FISTULAGRAM Left 06/02/2019   Procedure: A/V FISTULAGRAM;  Surgeon: Katha Cabal, MD;  Location: [place] [facility];  Service: Cardiovascular;  Laterality: Left;  . APPLICATION OF WOUND VAC Left 08/14/2017   Procedure: APPLICATION OF WOUND VAC Exchange;  Surgeon: Robert Bellow, MD;  Location: [facility];  Service: General;  Laterality: Left;  . APPLICATION OF WOUND VAC Left 12/21/2018   Procedure:  APPLICATION OF WOUND VAC;  Surgeon: Katha Cabal, MD;  Location: [facility];  Service: Vascular;  Laterality: Left;  . AV FISTULA PLACEMENT Left 08/19/2018   Procedure: ARTERIOVENOUS (AV) FISTULA CREATION ( BRACHIOBASILIC );  Surgeon: Katha Cabal, MD;  Location: [facility];  Service: Vascular;  Laterality: Left;  . BASCILIC VEIN TRANSPOSITION Left 11/18/2018   Procedure: BASCILIC VEIN TRANSPOSITION;  Surgeon: Katha Cabal, MD;  Location: [facility];  Service: Vascular;  Laterality: Left;  . CHOLECYSTECTOMY    . COLONOSCOPY WITH PROPOFOL N/A 02/03/2018   Procedure: COLONOSCOPY WITH PROPOFOL;  Surgeon: Lin Landsman, MD;   Location: [facility] ENDOSCOPY;  Service: Gastroenterology;  Laterality: N/A;  . CORONARY ANGIOPLASTY  07/2015   STENT  . CORONARY STENT INTERVENTION N/A 09/18/2016   Procedure: Coronary Stent Intervention;  Surgeon: Troy Sine, MD;  Location: [place] [facility];  Service: Cardiovascular;  Laterality: N/A;  . DIALYSIS/PERMA CATHETER INSERTION N/A 05/10/2018   Procedure: DIALYSIS/PERMA CATHETER INSERTION;  Surgeon: Katha Cabal, MD;  Location: [place] [facility];  Service: Cardiovascular;  Laterality: N/A;  . DRESSING CHANGE UNDER ANESTHESIA Left 08/15/2017   Procedure: exploration of wound for bleeding;  Surgeon: Robert Bellow, MD;  Location: [facility];  Service: General;  Laterality: Left;  . ESOPHAGOGASTRODUODENOSCOPY (EGD) WITH PROPOFOL N/A 02/03/2018   Procedure: ESOPHAGOGASTRODUODENOSCOPY (EGD) WITH PROPOFOL;  Surgeon: Lin Landsman, MD;  Location: [facility] ENDOSCOPY;  Service: Gastroenterology;  Laterality: N/A;  . EYE SURGERY  11/17/2018  . INCISION AND DRAINAGE ABSCESS Left 08/12/2017   Procedure: INCISION AND DRAINAGE ABSCESS;  Surgeon: Robert Bellow, MD;  Location: [facility];  Service: General;  Laterality: Left;  . KNEE ARTHROSCOPY    . LEFT HEART CATH N/A 09/18/2016   Procedure: Left Heart Cath;  Surgeon: Troy Sine, MD;  Location: [place] [facility];  Service: Cardiovascular;  Laterality: N/A;  . LEFT HEART CATH AND CORONARY ANGIOGRAPHY N/A 09/16/2016   Procedure: Left Heart Cath and Coronary Angiography;  Surgeon: Burnell Blanks, MD;  Location: [place] [facility];  Service: Cardiovascular;  Laterality: N/A;  . LEFT HEART CATH AND CORONARY ANGIOGRAPHY N/A 04/29/2017   Procedure: LEFT HEART CATH AND CORONARY ANGIOGRAPHY;  Surgeon: Nelva Bush, MD;  Location: [place] [facility];  Service: Cardiovascular;  Laterality: N/A;  . LOWER EXTREMITY ANGIOGRAPHY Right 03/08/2018   Procedure: LOWER EXTREMITY ANGIOGRAPHY;  Surgeon: Katha Cabal, MD;  Location: [place] [facility];  Service: Cardiovascular;  Laterality: Right;  . TUBAL LIGATION    . TUBAL LIGATION    . UPPER EXTREMITY ANGIOGRAPHY Right 09/19/2019   Procedure: UPPER EXTREMITY ANGIOGRAPHY;  Surgeon: Katha Cabal, MD;  Location: [place] [facility];  Service: Cardiovascular;  Laterality: Right;  . WOUND DEBRIDEMENT Left 12/21/2018   Procedure: DEBRIDEMENT WOUND;  Surgeon: Katha Cabal, MD;  Location: [facility];  Service: Vascular;  Laterality: Left;  . WOUND DEBRIDEMENT Left 12/30/2018   Procedure: DEBRIDEMENT WOUND WITH VAC PLACEMENT (LEFT UPPER EXTREMITY);  Surgeon: Katha Cabal, MD;  Location: [facility];  Service: Vascular;  Laterality: Left;   Family History  Problem Relation Age of Onset  . Colon cancer Mother   . Heart attack Other   . Heart attack Maternal Grandmother   . Hypertension Sister   . Hypertension Brother   . Diabetes Paternal Grandmother   . Breast cancer Neg Hx    Social History:  reports that she quit smoking about 39 years ago. Her  smoking use included cigarettes. She has a 1.50 pack-year smoking history. She has never used smokeless tobacco. She reports current alcohol use. She reports current drug use. Drug: Marijuana. Allergies  Allergen Reactions  . Shellfish Allergy Anaphylaxis and Swelling  . Diazepam Other (See Comments)    "felt like out of body experience"   Prior to Admission medications   Medication Sig Start Date End Date Taking? Authorizing Provider  traMADol (ULTRAM) 50 MG tablet Take 1-2 tablets (50-100 mg total) by mouth daily as needed for moderate pain. MAY MAKE DROWSY 09/21/19   [name], PA-C  Accu-Chek FastClix Lancets MISC USE AS DIRECTED TO CHECK BLOOD SUGAR THREE TIMES DAILY AND AT BEDTIME 09/28/18   [name], PA-C  albuterol (VENTOLIN HFA) 108 (90 Base) MCG/ACT inhaler Inhale 2 puffs into the lungs every 4 (four) hours as needed for wheezing or shortness of breath. 10/21/19 11/20/19  Janeece Fitting, PA-C   allopurinol (ZYLOPRIM) 100 MG tablet TAKE 1 TABLET BY MOUTH ONCE DAILY. Patient taking differently: Take 100 mg by mouth daily.  06/16/19   [name], PA-C  aspirin EC 81 MG EC tablet Take 1 tablet (81 mg total) by mouth daily. 09/19/16   Strader, Fransisco Hertz, PA-C  atorvastatin (LIPITOR) 80 MG tablet TAKE 1 TABLET BY MOUTH ONCE DAILY AT 6PM Patient taking differently: Take 80 mg by mouth daily at 6 PM.  07/18/19   Burnette, Clearnce Sorrel, PA-C  AURYXIA 1 GM 210 MG(Fe) tablet Take 420 mg by mouth 3 (three) times daily with meals.  10/10/18   [provider]  b complex-vitamin c-folic acid (NEPHRO-VITE) 0.8 MG TABS tablet Take 1 tablet by mouth daily.    [provider]  carvedilol (COREG) 25 MG tablet TAKE (1) TABLET BY MOUTH TWICE A DAY WITH MEALS (BREAKFAST AND SUPPER) Patient taking differently: Take 25 mg by mouth 3 (three) times daily.  07/18/19   [name], PA-C  cephALEXin (KEFLEX) 500 MG capsule Take 1 capsule (500 mg total) by mouth 2 (two) times daily. Patient not taking: Reported on 09/18/2019 08/10/19   [name], PA-C  Continuous Blood Gluc Receiver (FREESTYLE LIBRE 14 DAY READER) DEVI 1 each by Does not apply route 4 (four) times daily -  before meals and at bedtime. 08/10/19   [name], PA-C  Continuous Blood Gluc Sensor (FREESTYLE LIBRE 14 DAY SENSOR) MISC 1 each by Does not apply route every 14 (fourteen) days. Check blood sugar ACHS 08/10/19   [name], PA-C  cyclobenzaprine (FLEXERIL) 5 MG tablet Take 1 tablet (5 mg total) by mouth 3 (three) times daily as needed for muscle spasms. 08/10/19   [name], PA-C  dicyclomine (BENTYL) 20 MG tablet TAKE 1 TABLET(20 MG) BY MOUTH FOUR TIMES DAILY BEFORE MEALS AND AT BEDTIME 10/16/19   [name], PA-C  docusate sodium (COLACE) 100 MG capsule Take 1 capsule (100 mg total) by mouth 2 (two) times daily as needed for mild constipation. 09/20/17   Nicholes Mango, MD   doxycycline (VIBRA-TABS) 100 MG tablet Take 1 tablet (100 mg total) by mouth 2 (two) times daily. Patient not taking: Reported on 09/18/2019 08/21/19   Edrick Kins, DPM  fluticasone [facility]) 50 MCG/ACT nasal spray Place 2 sprays into both nostrils daily as needed for allergies or rhinitis. 09/28/17   [name], PA-C  gentamicin cream (GARAMYCIN) 0.1 % Apply 1 application topically 2 (two) times daily. Patient taking differently: Apply 1  application topically daily as needed (itching).  08/21/19   Edrick Kins, DPM  hydrALAZINE (APRESOLINE) 100 MG tablet TAKE (1) TABLET BY MOUTH THREE TIMES DAILY Patient taking differently: Take 100 mg by mouth 3 (three) times daily.  08/14/19   [name], PA-C  HYDROcodone-acetaminophen (NORCO/VICODIN) 5-325 MG tablet Take 1 tablet by mouth every 6 (six) hours as needed for moderate pain.    [provider]  hydrOXYzine (ATARAX/VISTARIL) 25 MG tablet Take 1 tablet (25 mg total) by mouth every 6 (six) hours as needed for itching. 12/07/18   Burnette, Clearnce Sorrel, PA-C  LEVEMIR FLEXTOUCH 100 UNIT/ML Pen INJECT 60 UNITS UNDER THE SKIN TWICE DAILY Patient taking differently: Inject 60 Units into the skin 2 (two) times daily.  08/17/19   [name], PA-C  lidocaine (LIDODERM) 5 % Place 1 patch onto the skin every 12 (twelve) hours. Remove & Discard patch within 12 hours or as directed by MD Patient not taking: Reported on 09/18/2019 06/29/19 06/28/20  Blake Divine, MD  lidocaine-prilocaine (EMLA) cream Apply 1 application topically as needed. 03/27/19   Schnier, Dolores Lory, MD  liraglutide (VICTOZA) 18 MG/3ML SOPN Start with 0.10m daily and increase by 0.670mper week until max dose of 1.8 mg dose achieved. Patient taking differently: Inject 0.6-1.8 mg into the skin See admin instructions. Start with 0.47m4maily and increase by 0.8mg104mr week until max dose of 1.2mg 52me achieved. 09/08/19   BurneMar DaringC  loratadine  (CLARITIN) 10 MG tablet TAKE 1 TABLET BY MOUTH ONCE DAILY. Patient taking differently: Take 10 mg by mouth at bedtime.  08/11/19   BurneMar DaringC  methylPREDNISolone (MEDROL) 4 MG TBPK tablet 6 day taper; take as directed on package instructions Patient not taking: Reported on 09/18/2019 08/10/19   BurneMar DaringC  mometasone (ELOCON) 0.1 % cream APPLY AS DIRECTED TO AFFECTED AREA ONCE DAILY. Patient taking differently: Apply 1 application topically daily as needed.  12/08/18   BurneMar DaringC  mometasone-formoterol (DULERA) 200-5 MCG/ACT AERO Inhale 2 puffs into the lungs 2 (two) times daily. Patient not taking: Reported on 09/18/2019 08/03/18   BurneMar DaringC  montelukast (SINGULAIR) 10 MG tablet TAKE ONE TABLET BY MOUTH AT BEDTIME. Patient taking differently: Take 10 mg by mouth at bedtime.  08/11/19   BurneMar DaringC  nitroGLYCERIN (NITROSTAT) 0.4 MG SL tablet Place 1 tablet (0.4 mg total) under the tongue every 5 (five) minutes x 3 doses as needed for chest pain. 02/03/19   Bhagat, Bhavinkumar, PA  NOVOLOG FLEXPEN 100 UNIT/ML FlexPen USE 25 UNITS UNDER THE SKIN THREE TIMES DAILY WITH MEALS Patient taking differently: Inject 25 Units into the skin 3 (three) times daily with meals.  07/18/19   BurneMar DaringC  nystatin cream (MYCOSTATIN) Apply 1 application topically 2 (two) times daily. Patient taking differently: Apply 1 application topically daily as needed for dry skin.  08/10/19   BurneMar DaringC  omeprazole (PRILOSEC) 20 MG capsule TAKE (1) CAPSULE BY MOUTH ONCE DAILY. Patient taking differently: Take 20 mg by mouth every evening.  08/11/19   BurneMar DaringC  pregabalin (LYRICA) 150 MG capsule Take 1 capsule (150 mg total) by mouth 2 (two) times daily. 08/10/19   Burnette, Jennifer M, PA-C  SENNA PLUS 8.6-50 MG tablet TAKE ONE TABLET BY MOUTH AT BEDTIME. Patient taking differently: Take 1 tablet by mouth at  bedtime.  08/11/19   BurneFenton Malling  M, PA-C  silver sulfADIAZINE (SILVADENE) 1 % cream Apply 1 application topically daily. Apply to right first toe Patient taking differently: Apply 1 application topically daily as needed (pain).  02/21/19   Kris Hartmann, NP  torsemide (DEMADEX) 20 MG tablet TAKE (2) TABLETS BY MOUTH TWICE DAILY. Patient taking differently: Take 40 mg by mouth 2 (two) times daily.  04/20/19   [name], PA-C  triamcinolone cream (KENALOG) 0.1 % APPLY TO AFFECTED AREA TWICE DAILY Patient not taking: Reported on 09/18/2019 06/17/18   [name], PA-C  ULTICARE SHORT PEN NEEDLES 31G X 8 MM MISC USE THREE TIMES DAILY WITH INSULIN 10/10/19   [name], PA-C  valACYclovir (VALTREX) 500 MG tablet TAKE (1) TABLET BY MOUTH EVERY OTHER DAY. 10/06/19   [name], PA-C  Vitamin D, Ergocalciferol, (DRISDOL) 1.25 MG (50000 UT) CAPS capsule TAKE 1 CAPSULE BY MOUTH ONCE A MONTH Patient taking differently: Take 50,000 Units by mouth every 30 (thirty) days.  06/16/19   [name], PA-C  gabapentin (NEURONTIN) 100 MG capsule Take 1 capsule (100 mg total) by mouth 3 (three) times daily. 08/19/17 02/03/19  [name], MD   Current Facility-Administered Medications  Medication Dose Route Frequency Provider Last Rate Last Admin  . [START ON 11/01/2019] Chlorhexidine Gluconate Cloth 2 % PADS 6 each  6 each Topical Q0600 Valentina Gu, NP      . Derrill Memo ON 11/02/2019] cinacalcet (SENSIPAR) tablet 60 mg  60 mg Oral Q T,Th,Sa-HD Valentina Gu, NP      . Derrill Memo ON 11/02/2019] doxercalciferol (HECTOROL) injection 8 mcg  8 mcg Intravenous Q T,Th,Sa-HD Valentina Gu, NP       Current Outpatient Medications  Medication Sig Dispense Refill  . traMADol (ULTRAM) 50 MG tablet Take 1-2 tablets (50-100 mg total) by mouth daily as needed for moderate pain. MAY MAKE DROWSY 40 tablet 0  . Accu-Chek FastClix Lancets MISC USE AS DIRECTED TO CHECK BLOOD  SUGAR THREE TIMES DAILY AND AT BEDTIME 102 each 0  . albuterol (VENTOLIN HFA) 108 (90 Base) MCG/ACT inhaler Inhale 2 puffs into the lungs every 4 (four) hours as needed for wheezing or shortness of breath. 6.7 g 0  . allopurinol (ZYLOPRIM) 100 MG tablet TAKE 1 TABLET BY MOUTH ONCE DAILY. (Patient taking differently: Take 100 mg by mouth daily. ) 28 tablet 5  . aspirin EC 81 MG EC tablet Take 1 tablet (81 mg total) by mouth daily.    [name] atorvastatin (LIPITOR) 80 MG tablet TAKE 1 TABLET BY MOUTH ONCE DAILY AT 6PM (Patient taking differently: Take 80 mg by mouth daily at 6 PM. ) 28 tablet 3  . AURYXIA 1 GM 210 MG(Fe) tablet Take 420 mg by mouth 3 (three) times daily with meals.     [name] b complex-vitamin c-folic acid (NEPHRO-VITE) 0.8 MG TABS tablet Take 1 tablet by mouth daily.    . carvedilol (COREG) 25 MG tablet TAKE (1) TABLET BY MOUTH TWICE A DAY WITH MEALS (BREAKFAST AND SUPPER) (Patient taking differently: Take 25 mg by mouth 3 (three) times daily. ) 56 tablet 3  . cephALEXin (KEFLEX) 500 MG capsule Take 1 capsule (500 mg total) by mouth 2 (two) times daily. (Patient not taking: Reported on 09/18/2019) 14 capsule 0  . Continuous Blood Gluc Receiver (FREESTYLE LIBRE 14 DAY READER) DEVI 1 each by Does not apply route 4 (four) times daily -  before meals and at bedtime. 1 each 0  .  Continuous Blood Gluc Sensor (FREESTYLE LIBRE 14 DAY SENSOR) MISC 1 each by Does not apply route every 14 (fourteen) days. Check blood sugar ACHS 6 each 4  . cyclobenzaprine (FLEXERIL) 5 MG tablet Take 1 tablet (5 mg total) by mouth 3 (three) times daily as needed for muscle spasms. 30 tablet 5  . dicyclomine (BENTYL) 20 MG tablet TAKE 1 TABLET(20 MG) BY MOUTH FOUR TIMES DAILY BEFORE MEALS AND AT BEDTIME 120 tablet 2  . docusate sodium (COLACE) 100 MG capsule Take 1 capsule (100 mg total) by mouth 2 (two) times daily as needed for mild constipation. 30 capsule 0  . doxycycline (VIBRA-TABS) 100 MG tablet Take 1 tablet (100 mg  total) by mouth 2 (two) times daily. (Patient not taking: Reported on 09/18/2019) 20 tablet 0  . fluticasone (FLONASE) 50 MCG/ACT nasal spray Place 2 sprays into both nostrils daily as needed for allergies or rhinitis. 48 g 1  . gentamicin cream (GARAMYCIN) 0.1 % Apply 1 application topically 2 (two) times daily. (Patient taking differently: Apply 1 application topically daily as needed (itching). ) 15 g 1  . hydrALAZINE (APRESOLINE) 100 MG tablet TAKE (1) TABLET BY MOUTH THREE TIMES DAILY (Patient taking differently: Take 100 mg by mouth 3 (three) times daily. ) 84 tablet 11  . HYDROcodone-acetaminophen (NORCO/VICODIN) 5-325 MG tablet Take 1 tablet by mouth every 6 (six) hours as needed for moderate pain.    . hydrOXYzine (ATARAX/VISTARIL) 25 MG tablet Take 1 tablet (25 mg total) by mouth every 6 (six) hours as needed for itching. 120 tablet 10  . LEVEMIR FLEXTOUCH 100 UNIT/ML Pen INJECT 60 UNITS UNDER THE SKIN TWICE DAILY (Patient taking differently: Inject 60 Units into the skin 2 (two) times daily. ) 15 mL 3  . lidocaine (LIDODERM) 5 % Place 1 patch onto the skin every 12 (twelve) hours. Remove & Discard patch within 12 hours or as directed by MD (Patient not taking: Reported on 09/18/2019) 10 patch 0  . lidocaine-prilocaine (EMLA) cream Apply 1 application topically as needed. 30 g 11  . liraglutide (VICTOZA) 18 MG/3ML SOPN Start with 0.57m daily and increase by 0.683mper week until max dose of 1.8 mg dose achieved. (Patient taking differently: Inject 0.6-1.8 mg into the skin See admin instructions. Start with 0.109m9maily and increase by 0.8mg67mr week until max dose of 1.2mg 7109me achieved.) 9 mL 5  . loratadine (CLARITIN) 10 MG tablet TAKE 1 TABLET BY MOUTH ONCE DAILY. (Patient taking differently: Take 10 mg by mouth at bedtime. ) 28 tablet 11  . methylPREDNISolone (MEDROL) 4 MG TBPK tablet 6 day taper; take as directed on package instructions (Patient not taking: Reported on 09/18/2019) 21 tablet 0  .  mometasone (ELOCON) 0.1 % cream APPLY AS DIRECTED TO AFFECTED AREA ONCE DAILY. (Patient taking differently: Apply 1 application topically daily as needed. ) 45 g 10  . mometasone-formoterol (DULERA) 200-5 MCG/ACT AERO Inhale 2 puffs into the lungs 2 (two) times daily. (Patient not taking: Reported on 09/18/2019) 13 g 5  . montelukast (SINGULAIR) 10 MG tablet TAKE ONE TABLET BY MOUTH AT BEDTIME. (Patient taking differently: Take 10 mg by mouth at bedtime. ) 28 tablet 11  . nitroGLYCERIN (NITROSTAT) 0.4 MG SL tablet Place 1 tablet (0.4 mg total) under the tongue every 5 (five) minutes x 3 doses as needed for chest pain. 25 tablet 12  . NOVOLOG FLEXPEN 100 UNIT/ML FlexPen USE 25 UNITS UNDER THE SKIN THREE TIMES DAILY WITH MEALS (Patient taking  differently: Inject 25 Units into the skin 3 (three) times daily with meals. ) 15 mL 10  . nystatin cream (MYCOSTATIN) Apply 1 application topically 2 (two) times daily. (Patient taking differently: Apply 1 application topically daily as needed for dry skin. ) 30 g 0  . omeprazole (PRILOSEC) 20 MG capsule TAKE (1) CAPSULE BY MOUTH ONCE DAILY. (Patient taking differently: Take 20 mg by mouth every evening. ) 28 capsule 11  . pregabalin (LYRICA) 150 MG capsule Take 1 capsule (150 mg total) by mouth 2 (two) times daily. 60 capsule 5  . SENNA PLUS 8.6-50 MG tablet TAKE ONE TABLET BY MOUTH AT BEDTIME. (Patient taking differently: Take 1 tablet by mouth at bedtime. ) 28 tablet 11  . silver sulfADIAZINE (SILVADENE) 1 % cream Apply 1 application topically daily. Apply to right first toe (Patient taking differently: Apply 1 application topically daily as needed (pain). ) 50 g 0  . torsemide (DEMADEX) 20 MG tablet TAKE (2) TABLETS BY MOUTH TWICE DAILY. (Patient taking differently: Take 40 mg by mouth 2 (two) times daily. ) 112 tablet 11  . triamcinolone cream (KENALOG) 0.1 % APPLY TO AFFECTED AREA TWICE DAILY (Patient not taking: Reported on 09/18/2019) 80 g 0  . ULTICARE SHORT PEN  NEEDLES 31G X 8 MM MISC USE THREE TIMES DAILY WITH INSULIN 100 each 5  . valACYclovir (VALTREX) 500 MG tablet TAKE (1) TABLET BY MOUTH EVERY OTHER DAY. 14 tablet 11  . Vitamin D, Ergocalciferol, (DRISDOL) 1.25 MG (50000 UT) CAPS capsule TAKE 1 CAPSULE BY MOUTH ONCE A MONTH (Patient taking differently: Take 50,000 Units by mouth every 30 (thirty) days. ) 1 capsule 5   Labs: Basic Metabolic Panel: Recent Labs  Lab 10/31/19 0733  NA 134*  K 5.1  CL 101  CO2 16*  GLUCOSE 271*  BUN 126*  CREATININE 9.79*  CALCIUM 7.9*   Liver Function Tests: No results for input(s): AST, ALT, ALKPHOS, BILITOT, PROT, ALBUMIN in the last 168 hours. No results for input(s): LIPASE, AMYLASE in the last 168 hours. No results for input(s): AMMONIA in the last 168 hours. CBC: Recent Labs  Lab 10/31/19 0733  WBC 8.7  HGB 9.3*  HCT 29.5*  MCV 101.0*  PLT 168   Cardiac Enzymes: No results for input(s): CKTOTAL, CKMB, CKMBINDEX, TROPONINI in the last 168 hours. CBG: No results for input(s): GLUCAP in the last 168 hours. Iron Studies: No results for input(s): IRON, TIBC, TRANSFERRIN, FERRITIN in the last 72 hours. Studies/Results: DG Chest 1 View  Result Date: 10/31/2019 CLINICAL DATA:  Shortness of breath, fall. EXAM: CHEST  1 VIEW COMPARISON:  October 21, 2019. FINDINGS: Stable cardiomediastinal silhouette. No pneumothorax or pleural effusion is noted. Left lung is clear. Minimal right basilar subsegmental atelectasis scarring is noted. The visualized skeletal structures are unremarkable. IMPRESSION: No active disease. Electronically Signed   By: Marijo Conception M.D.   On: 10/31/2019 12:16   DG Knee Complete 4 Views Right  Result Date: 10/31/2019 CLINICAL DATA:  Acute right knee pain after fall today. EXAM: RIGHT KNEE - COMPLETE 4+ VIEW COMPARISON:  None. FINDINGS: No evidence of fracture, dislocation, or joint effusion. Moderate narrowing of medial joint space is noted with osteophyte formation. Soft  tissues are unremarkable. IMPRESSION: Moderate degenerative joint disease is noted medially. No acute abnormality seen in the right knee. Electronically Signed   By: Marijo Conception M.D.   On: 10/31/2019 12:17    ROS: As per HPI otherwise negative.  Physical Exam: Vitals:   10/31/19 0730 10/31/19 0932 10/31/19 1115 10/31/19 1200  BP: (!) 149/58 (!) 149/81 (!) 165/77 (!) 149/79  Pulse: 78 73 78 75  Resp: 16 18 (!) 21 11  Temp: 98 F (36.7 C)     TempSrc: Oral     SpO2: 98% 98% 98% 98%     General: Obese female seen in ED, breathless when she talks, mild respiratory distress.  Head: Normocephalic, atraumatic, sclera non-icteric, mucus membranes are moist Neck: Supple. JVD to mandible Lungs: Bilateral breath sounds crackles RLL clear LLL, mild WOB present.  Heart: RRR with S1 S2. No murmurs, rubs, or gallops appreciated. SR on monitor.  Abdomen: Soft, non-tender, non-distended with normoactive bowel sounds. No rebound/guarding. No obvious abdominal masses. M-S:  Strength and tone appear normal for age. Lower extremities: Trace to 1+ BLE LE edema.  Neuro: Alert and oriented X 3. Moves all extremities spontaneously. Psych:  Responds to questions appropriately with a normal affect. Dialysis Access: L AVF + bruit  Dialysis Orders:[facility] T,Th,S 4 hrs 180NRe 450/800 129.5kg 2.0 K/3.5 Ca L AVF -Heparin 6000 units IV TIW -Hectorol 8 mcg IV TIW -Sensipar 60 mg PO TIW -Mircera 30 mcg IV q 2 week (Last dose 10/24/2019 Last HGB 10.5 10/26/19) -Venofer 50 mg IV weekly (last dose 10/26/19)   Assessment/Plan: 1.  Volume overload in setting of missed HD. Urgent HD for volume removal today.  2.  ESRD -  T,Th,S Missed HD 04/17. K+5.1. 2.0 K bath, usual heparin.  3.  Hypertension/volume  -BP actually fairly well controlled. Continue Carvedilol 25 mg PO BID, Hydralazine 100 mg PO TID, torsemide 40 mg PO BID.   4.  Anemia  - HGB 9.3 Recent OP ESA dose, continue weekly venofer.  5.  Metabolic bone  disease -  Last OP BMD labs close to goal. Continue sensipar, VDRA, binders.  6.  Nutrition - Renal/Carb mod diet, nepro. Renal vits 7.  DM-per primary. BS high.  8.  Combined systolic/diastolic HF-evidence of volume overload by exam. HD for volume removal. Continue carvedilol, torsemide.   Kutter Schnepf H. Owens Shark, NP-C 10/31/2019, 2:45 PM  [employer] [phone]

## 2019-10-31 NOTE — ED Notes (Signed)
After IV start and blood draw pt quickly goes to sleep.  NAD prior.

## 2019-10-31 NOTE — ED Triage Notes (Signed)
Pt from home with ems for SOB on exertion, pt was getting into her car to go to dialysis when she leg gave out and she fell, no injuries from the fall. Pt last had dialysis Thursday, missed Saturday due to a family emergency. Pt a.o, resp e.u VSS

## 2019-10-31 NOTE — ED Provider Notes (Signed)
[facility] EMERGENCY DEPARTMENT Provider Note   CSN: [id number] Arrival date & time: 10/31/19  1540     History Chief Complaint  Patient presents with  . Shortness of Breath    [name] is a 64 y.o. female.  HPI 64 year old female with a extensive medical history including DM type II, hypertension, CHF with preserved EF, ESRD on dialysis TTS, COPD presents to the ER for increasing shortness of breath starting yesterday.  Patient missed dialysis on Saturday due to family issues, states she was on her way to dialysis today when she was getting into the car and felt weak, fell down to her right knee.  Denies hitting head, LOC.  She reports increasing shortness of breath since yesterday, worse with movement.  Reports cough with yellow sputum and 1 episode with blood.  She states she feels like this is fluid overload, reports that she has been compliant with her inhalers for COPD.  Endorses occasional nausea but no vomiting.  She denies fevers, chills, radiating chest pain to the back or arm, palpitations, abdominal pain, back pain, dysuria, or any other infectious symptoms.    Past Medical History:  Diagnosis Date  . Anemia   . Aortic stenosis    Echo 8/18: mean 13, peak 28, LVOT/AV mean velocity 0.51  . Arthritis   . Asthma    As a child   . Bronchitis   . CAD (coronary artery disease)    a. 09/2016: 50% Ost 1st Mrg stenosis, 50% 2nd Mrg stenosis, 20% Mid-Cx, 95% Prox LAD, 40% mid-LAD, and 10% dist-LAD stenosis. Staged PCI with DES to Prox-LAD.   [name] Chronic combined systolic and diastolic CHF (congestive heart failure) ([place]) 2011   echo 2/18: EF 55-60, normal wall motion, grade 2 diastolic dysfunction, trivial AI // echo 3/18: Septal and apical HK, EF 45-50, normal wall motion, trivial AI, mild LAE, PASP 38 // echo 8/18: EF 60-65, normal wall motion, grade 1 diastolic dysfunction, calcified aortic valve leaflets, mild aortic stenosis (mean 13, peak 28, LVOT/AV mean  velocity 0.51), mild AI, moderate MAC, mild LAE, trivial TR   . Chronic kidney disease    STAGE 4  . Chronic kidney disease on chronic dialysis (HCC)    t, th, sat  . Complication of anesthesia   . Depression   . Diabetes mellitus Dx 1989  . Elevated lipids   . GERD (gastroesophageal reflux disease)   . Gout   . Heart murmur    asymptomatic  . Hepatitis C Dx 2013  . Hypertension Dx 1989  . Infected surgical wound    Lt arm  . Myocardial infarction ([place]) 07/2015  . Obesity   . Pancreatitis 2013  . Pneumonia   . Refusal of blood transfusions as patient is Jehovah's Witness   . Tendinitis   . Tremors of nervous system    LEFT HAND  . Ulcer 2010    Patient Active Problem List   Diagnosis Date Noted  . Ascending aortic aneurysm ([place]) 03/27/2019  . Mild protein-calorie malnutrition ([place]) 01/25/2019  . Puncture wound without foreign body of left forearm, initial encounter 01/02/2019  . Non-healing wound of upper extremity 12/28/2018  . Unspecified open wound of left upper arm, initial encounter 12/06/2018  . Complication from renal dialysis device 10/20/2018  . Coagulation defect, unspecified ([place]) 10/18/2018  . Iron deficiency anemia, unspecified 10/18/2018  . Secondary hyperparathyroidism of renal origin ([place]) 10/18/2018  . ESRD on dialysis ([place]) 06/01/2018  . ARF (acute  renal failure) ([place]) 05/09/2018  . Colon cancer screening   . LGSIL on Pap smear of cervix 01/27/2018  . Gout 12/25/2017  . AKI (acute kidney injury) ([place]) 12/24/2017  . Hyperglycemia 12/24/2017  . Chronic diastolic heart failure ([place]) 09/30/2017  . Lymphedema 09/30/2017  . Aortic stenosis   . Chronic pain of right knee 02/12/2017  . Chronic gout due to renal impairment of multiple sites without tophus 02/12/2017  . Esophageal dysphagia 02/12/2017  . Osteoarthritis 01/22/2017  . Diabetic hyperosmolar non-ketotic state ([place]) 12/13/2016  . CAD (coronary artery disease) 12/13/2016  . Hyperlipidemia  12/13/2016  . Hyperphosphatemia 12/13/2016  . Asthma 11/29/2015  . Depression 09/05/2015  . GERD (gastroesophageal reflux disease) 03/25/2015  . Environmental allergies 03/14/2015  . Chronic hepatitis C without hepatic coma ([place]) 01/31/2015  . Diabetic neuropathy ([place]) 01/31/2015  . OSA (obstructive sleep apnea) 01/01/2015  . Poor dentition 11/13/2014  . Essential hypertension 10/08/2014  . Morbid (severe) obesity due to excess calories ([place]) 10/08/2014  . Diabetes mellitus type 2, uncontrolled, with complications ([place]) 35/59/7416    Class: Chronic    Past Surgical History:  Procedure Laterality Date  . A/V FISTULAGRAM Left 04/11/2019   Procedure: A/V FISTULAGRAM;  Surgeon: Katha Cabal, MD;  Location: [place] [facility];  Service: Cardiovascular;  Laterality: Left;  . A/V FISTULAGRAM Left 06/02/2019   Procedure: A/V FISTULAGRAM;  Surgeon: Katha Cabal, MD;  Location: [place] [facility];  Service: Cardiovascular;  Laterality: Left;  . APPLICATION OF WOUND VAC Left 08/14/2017   Procedure: APPLICATION OF WOUND VAC Exchange;  Surgeon: Robert Bellow, MD;  Location: [facility] ORS;  Service: General;  Laterality: Left;  . APPLICATION OF WOUND VAC Left 12/21/2018   Procedure: APPLICATION OF WOUND VAC;  Surgeon: Katha Cabal, MD;  Location: [facility] OR[facility];  Service: Vascular;  Laterality: Left;  . AV FISTULA PLACEMENT Left 08/19/2018   Procedure: ARTERIOVENOUS (AV) FISTULA CREATION ( BRACHIOBASILIC );  Surgeon: Katha Cabal, MD;  Location: [facility];  Service: Vascular;  Laterality: Left;  . BASCILIC VEIN TRANSPOSITION Left 11/18/2018   Procedure: BASCILIC VEIN TRANSPOSITION;  Surgeon: Katha Cabal, MD;  Location: [facility];  Service: Vascular;  Laterality: Left;  . CHOLECYSTECTOMY    . COLONOSCOPY WITH PROPOFOL N/A 02/03/2018   Procedure: COLONOSCOPY WITH PROPOFOL;  Surgeon: Lin Landsman, MD;  Location[facility];  Service: Gastroenterology;  Laterality: N/A;  .  CORONARY ANGIOPLASTY  07/2015   STENT  . CORONARY STENT INTERVENTION N/A 09/18/2016   Procedure: Coronary Stent Intervention;  Surgeon: Troy Sine, MD;  Location: [place] [facility];  Service: Cardiovascular;  Laterality: N/A;  . DIALYSIS/PERMA CATHETER INSERTION N/A 05/10/2018   Procedure: DIALYSIS/PERMA CATHETER INSERTION;  Surgeon: Katha Cabal, MD;  Location: [place] [facility];  Service: Cardiovascular;  Laterality: N/A;  . DRESSING CHANGE UNDER ANESTHESIA Left 08/15/2017   Procedure: exploration of wound for bleeding;  Surgeon: Robert Bellow, MD;  Location: [facility];  Service: General;  Laterality: Left;  . ESOPHAGOGASTRODUODENOSCOPY (EGD) WITH PROPOFOL N/A 02/03/2018   Procedure: ESOPHAGOGASTRODUODENOSCOPY (EGD) WITH PROPOFOL;  Surgeon: Lin Landsman, MD;  Location: [facility] ENDOSCOPY;  Service: Gastroenterology;  Laterality: N/A;  . EYE SURGERY  11/17/2018  . INCISION AND DRAINAGE ABSCESS Left 08/12/2017   Procedure: INCISION AND DRAINAGE ABSCESS;  Surgeon: Robert Bellow, MD;  Location: [facility];  Service: General;  Laterality: Left;  . KNEE ARTHROSCOPY    . LEFT HEART CATH N/A 09/18/2016  Procedure: Left Heart Cath;  Surgeon: Troy Sine, MD;  Location: [place] [facility];  Service: Cardiovascular;  Laterality: N/A;  . LEFT HEART CATH AND CORONARY ANGIOGRAPHY N/A 09/16/2016   Procedure: Left Heart Cath and Coronary Angiography;  Surgeon: Burnell Blanks, MD;  Location: [place] [facility];  Service: Cardiovascular;  Laterality: N/A;  . LEFT HEART CATH AND CORONARY ANGIOGRAPHY N/A 04/29/2017   Procedure: LEFT HEART CATH AND CORONARY ANGIOGRAPHY;  Surgeon: Nelva Bush, MD;  Location: [place] [facility];  Service: Cardiovascular;  Laterality: N/A;  . LOWER EXTREMITY ANGIOGRAPHY Right 03/08/2018   Procedure: LOWER EXTREMITY ANGIOGRAPHY;  Surgeon: Katha Cabal, MD;  Location: [place] [facility];  Service: Cardiovascular;  Laterality: Right;  . TUBAL  LIGATION    . TUBAL LIGATION    . UPPER EXTREMITY ANGIOGRAPHY Right 09/19/2019   Procedure: UPPER EXTREMITY ANGIOGRAPHY;  Surgeon: Katha Cabal, MD;  Location: [place] [facility];  Service: Cardiovascular;  Laterality: Right;  . WOUND DEBRIDEMENT Left 12/21/2018   Procedure: DEBRIDEMENT WOUND;  Surgeon: Katha Cabal, MD;  Location: [facility];  Service: Vascular;  Laterality: Left;  . WOUND DEBRIDEMENT Left 12/30/2018   Procedure: DEBRIDEMENT WOUND WITH VAC PLACEMENT (LEFT UPPER EXTREMITY);  Surgeon: Katha Cabal, MD;  Location: [facility];  Service: Vascular;  Laterality: Left;     OB History    Gravida  4   Para  3   Term  3   Preterm      AB  1   Living  3     SAB  1   TAB      Ectopic      Multiple      Live Births  3           Family History  Problem Relation Age of Onset  . Colon cancer Mother   . Heart attack Other   . Heart attack Maternal Grandmother   . Hypertension Sister   . Hypertension Brother   . Diabetes Paternal Grandmother   . Breast cancer Neg Hx     Social History   Tobacco Use  . Smoking status: Former Smoker    Packs/day: 0.25    Years: 6.00    Pack years: 1.50    Types: Cigarettes    Quit date: 10/25/1980    Years since quitting: 39.0  . Smokeless tobacco: Never Used  Substance Use Topics  . Alcohol use: Yes    Comment: rarely  . Drug use: Yes    Types: Marijuana    Comment: Occasional marijuana use 08/21/2016 "no crack-clean since 05/1998"    Home Medications Prior to Admission medications   Medication Sig Start Date End Date Taking? Authorizing Provider  traMADol (ULTRAM) 50 MG tablet Take 1-2 tablets (50-100 mg total) by mouth daily as needed for moderate pain. MAY MAKE DROWSY 09/21/19   [name], PA-C  Accu-Chek FastClix Lancets MISC USE AS DIRECTED TO CHECK BLOOD SUGAR THREE TIMES DAILY AND AT BEDTIME 09/28/18   [name], PA-C  albuterol (VENTOLIN HFA) 108 (90 Base) MCG/ACT inhaler  Inhale 2 puffs into the lungs every 4 (four) hours as needed for wheezing or shortness of breath. 10/21/19 11/20/19  Janeece Fitting, PA-C  allopurinol (ZYLOPRIM) 100 MG tablet TAKE 1 TABLET BY MOUTH ONCE DAILY. Patient taking differently: Take 100 mg by mouth daily.  06/16/19   [name], PA-C  aspirin EC 81 MG EC tablet Take 1 tablet (81  mg total) by mouth daily. 09/19/16   Strader, Fransisco Hertz, PA-C  atorvastatin (LIPITOR) 80 MG tablet TAKE 1 TABLET BY MOUTH ONCE DAILY AT 6PM Patient taking differently: Take 80 mg by mouth daily at 6 PM.  07/18/19   Burnette, Clearnce Sorrel, PA-C  AURYXIA 1 GM 210 MG(Fe) tablet Take 420 mg by mouth 3 (three) times daily with meals.  10/10/18   [provider]  b complex-vitamin c-folic acid (NEPHRO-VITE) 0.8 MG TABS tablet Take 1 tablet by mouth daily.    [provider]  carvedilol (COREG) 25 MG tablet TAKE (1) TABLET BY MOUTH TWICE A DAY WITH MEALS (BREAKFAST AND SUPPER) Patient taking differently: Take 25 mg by mouth 3 (three) times daily.  07/18/19   [name], PA-C  cephALEXin (KEFLEX) 500 MG capsule Take 1 capsule (500 mg total) by mouth 2 (two) times daily. Patient not taking: Reported on 09/18/2019 08/10/19   [name], PA-C  Continuous Blood Gluc Receiver (FREESTYLE LIBRE 14 DAY READER) DEVI 1 each by Does not apply route 4 (four) times daily -  before meals and at bedtime. 08/10/19   [name], PA-C  Continuous Blood Gluc Sensor (FREESTYLE LIBRE 14 DAY SENSOR) MISC 1 each by Does not apply route every 14 (fourteen) days. Check blood sugar ACHS 08/10/19   [name], PA-C  cyclobenzaprine (FLEXERIL) 5 MG tablet Take 1 tablet (5 mg total) by mouth 3 (three) times daily as needed for muscle spasms. 08/10/19   [name], PA-C  dicyclomine (BENTYL) 20 MG tablet TAKE 1 TABLET(20 MG) BY MOUTH FOUR TIMES DAILY BEFORE MEALS AND AT BEDTIME 10/16/19   [name], PA-C  docusate sodium (COLACE) 100  MG capsule Take 1 capsule (100 mg total) by mouth 2 (two) times daily as needed for mild constipation. 09/20/17   Nicholes Mango, MD  doxycycline (VIBRA-TABS) 100 MG tablet Take 1 tablet (100 mg total) by mouth 2 (two) times daily. Patient not taking: Reported on 09/18/2019 08/21/19   Edrick Kins, DPM  fluticasone [facility]) 50 MCG/ACT nasal spray Place 2 sprays into both nostrils daily as needed for allergies or rhinitis. 09/28/17   [name], PA-C  gentamicin cream (GARAMYCIN) 0.1 % Apply 1 application topically 2 (two) times daily. Patient taking differently: Apply 1 application topically daily as needed (itching).  08/21/19   Edrick Kins, DPM  hydrALAZINE (APRESOLINE) 100 MG tablet TAKE (1) TABLET BY MOUTH THREE TIMES DAILY Patient taking differently: Take 100 mg by mouth 3 (three) times daily.  08/14/19   [name], PA-C  HYDROcodone-acetaminophen (NORCO/VICODIN) 5-325 MG tablet Take 1 tablet by mouth every 6 (six) hours as needed for moderate pain.    [provider]  hydrOXYzine (ATARAX/VISTARIL) 25 MG tablet Take 1 tablet (25 mg total) by mouth every 6 (six) hours as needed for itching. 12/07/18   Burnette, Clearnce Sorrel, PA-C  LEVEMIR FLEXTOUCH 100 UNIT/ML Pen INJECT 60 UNITS UNDER THE SKIN TWICE DAILY Patient taking differently: Inject 60 Units into the skin 2 (two) times daily.  08/17/19   [name], PA-C  lidocaine (LIDODERM) 5 % Place 1 patch onto the skin every 12 (twelve) hours. Remove & Discard patch within 12 hours or as directed by MD Patient not taking: Reported on 09/18/2019 06/29/19 06/28/20  Blake Divine, MD  lidocaine-prilocaine (EMLA) cream Apply 1 application topically as needed. 03/27/19   Schnier, Dolores Lory, MD  liraglutide (VICTOZA) 18 MG/3ML SOPN Start with  0.368m daily and increase by 0.612mper week until max dose of 1.8 mg dose achieved. Patient taking differently: Inject 0.6-1.8 mg into the skin See admin instructions. Start with 0.68m74maily  and increase by 0.8mg32mr week until max dose of 1.2mg 58me achieved. 09/08/19   BurneMar DaringC  loratadine (CLARITIN) 10 MG tablet TAKE 1 TABLET BY MOUTH ONCE DAILY. Patient taking differently: Take 10 mg by mouth at bedtime.  08/11/19   BurneMar DaringC  methylPREDNISolone (MEDROL) 4 MG TBPK tablet 6 day taper; take as directed on package instructions Patient not taking: Reported on 09/18/2019 08/10/19   BurneMar DaringC  mometasone (ELOCON) 0.1 % cream APPLY AS DIRECTED TO AFFECTED AREA ONCE DAILY. Patient taking differently: Apply 1 application topically daily as needed.  12/08/18   BurneMar DaringC  mometasone-formoterol (DULERA) 200-5 MCG/ACT AERO Inhale 2 puffs into the lungs 2 (two) times daily. Patient not taking: Reported on 09/18/2019 08/03/18   BurneMar DaringC  montelukast (SINGULAIR) 10 MG tablet TAKE ONE TABLET BY MOUTH AT BEDTIME. Patient taking differently: Take 10 mg by mouth at bedtime.  08/11/19   BurneMar DaringC  nitroGLYCERIN (NITROSTAT) 0.4 MG SL tablet Place 1 tablet (0.4 mg total) under the tongue every 5 (five) minutes x 3 doses as needed for chest pain. 02/03/19   Bhagat, Bhavinkumar, PA  NOVOLOG FLEXPEN 100 UNIT/ML FlexPen USE 25 UNITS UNDER THE SKIN THREE TIMES DAILY WITH MEALS Patient taking differently: Inject 25 Units into the skin 3 (three) times daily with meals.  07/18/19   BurneMar DaringC  nystatin cream (MYCOSTATIN) Apply 1 application topically 2 (two) times daily. Patient taking differently: Apply 1 application topically daily as needed for dry skin.  08/10/19   BurneMar DaringC  omeprazole (PRILOSEC) 20 MG capsule TAKE (1) CAPSULE BY MOUTH ONCE DAILY. Patient taking differently: Take 20 mg by mouth every evening.  08/11/19   BurneMar DaringC  pregabalin (LYRICA) 150 MG capsule Take 1 capsule (150 mg total) by mouth 2 (two) times daily. 08/10/19   Burnette, Jennifer M, PA-C  SENNA  PLUS 8.6-50 MG tablet TAKE ONE TABLET BY MOUTH AT BEDTIME. Patient taking differently: Take 1 tablet by mouth at bedtime.  08/11/19   BurneMar DaringC  silver sulfADIAZINE (SILVADENE) 1 % cream Apply 1 application topically daily. Apply to right first toe Patient taking differently: Apply 1 application topically daily as needed (pain).  02/21/19   BrownKris Hartmann torsemide (DEMADEX) 20 MG tablet TAKE (2) TABLETS BY MOUTH TWICE DAILY. Patient taking differently: Take 40 mg by mouth 2 (two) times daily.  04/20/19   BurneMar DaringC  triamcinolone cream (KENALOG) 0.1 % APPLY TO AFFECTED AREA TWICE DAILY Patient not taking: Reported on 09/18/2019 06/17/18   BurneMar DaringC  ULTICARE SHORT PEN NEEDLES 31G X 8 MM MISC USE THREE TIMES DAILY WITH INSULIN 10/10/19   BurneMar DaringC  valACYclovir (VALTREX) 500 MG tablet TAKE (1) TABLET BY MOUTH EVERY OTHER DAY. 10/06/19   BurneMar DaringC  Vitamin D, Ergocalciferol, (DRISDOL) 1.25 MG (50000 UT) CAPS capsule TAKE 1 CAPSULE BY MOUTH ONCE A MONTH Patient taking differently: Take 50,000 Units by mouth every 30 (thirty) days.  06/16/19   BurneMar DaringC  gabapentin (NEURONTIN) 100 MG capsule Take 1 capsule (100 mg total) by mouth 3 (three) times daily. 08/19/17 02/03/19  PatelFritzi Mandes  MD    Allergies    Shellfish allergy and Diazepam  Review of Systems   Review of Systems  Constitutional: Negative for chills and fever.  HENT: Negative for ear pain and sore throat.   Eyes: Negative for pain and visual disturbance.  Respiratory: Positive for cough, chest tightness and shortness of breath. Negative for choking.   Cardiovascular: Positive for chest pain and leg swelling. Negative for palpitations.  Gastrointestinal: Positive for nausea. Negative for abdominal pain, diarrhea and vomiting.  Genitourinary: Negative for difficulty urinating, dysuria, flank pain, hematuria and pelvic pain.  Musculoskeletal:  Negative for arthralgias and back pain.  Skin: Negative for color change and rash.  Neurological: Positive for weakness. Negative for dizziness, seizures, syncope and light-headedness.  Psychiatric/Behavioral: Negative for confusion.  All other systems reviewed and are negative.   Physical Exam Updated Vital Signs BP (!) 149/79   Pulse 75   Temp 98 F (36.7 C) (Oral)   Resp 11   SpO2 98%   Physical Exam Vitals and nursing note reviewed.  Constitutional:      General: She is in acute distress.     Appearance: She is obese. She is ill-appearing. She is not diaphoretic.     Interventions: She is not intubated.    Comments: Chronically ill-appearing  HENT:     Head: Normocephalic and atraumatic.  Eyes:     Extraocular Movements: Extraocular movements intact.     Conjunctiva/sclera: Conjunctivae normal.     Pupils: Pupils are equal, round, and reactive to light.  Neck:     Vascular: No hepatojugular reflux or JVD.  Cardiovascular:     Rate and Rhythm: Normal rate and regular rhythm.     Heart sounds: No murmur.  Pulmonary:     Effort: Tachypnea and accessory muscle usage present. No respiratory distress. She is not intubated.     Breath sounds: Decreased breath sounds present. No wheezing, rhonchi or rales.  Chest:     Chest wall: No tenderness.  Abdominal:     Palpations: Abdomen is soft.     Tenderness: There is no abdominal tenderness.     Comments: Distended  Musculoskeletal:     Cervical back: Neck supple.     Right lower leg: Tenderness present. Edema present.     Left lower leg: Tenderness present. Edema present.     Comments: Patient appears fluid overloaded, swelling to hands, legs with trace edema.  No evidence of injury to right knee, though tender to palpation.  Range of motion preserved.  Sensation is intact, DP pulses 2+ with Doppler.  Moving all 4 extremities.  Skin:    General: Skin is warm and dry.  Neurological:     General: No focal deficit present.      Mental Status: She is alert.  Psychiatric:        Mood and Affect: Mood normal.        Behavior: Behavior normal.     ED Results / Procedures / Treatments   Labs (all labs ordered are listed, but only abnormal results are displayed) Labs Reviewed  BASIC METABOLIC PANEL - Abnormal; Notable for the following components:      Result Value   Sodium 134 (*)    CO2 16 (*)    Glucose, Bld 271 (*)    BUN 126 (*)    Creatinine, Ser 9.79 (*)    Calcium 7.9 (*)    GFR calc non Af Amer 4 (*)    GFR calc Af  Amer 4 (*)    Anion gap 17 (*)    All other components within normal limits  CBC - Abnormal; Notable for the following components:   RBC 2.92 (*)    Hemoglobin 9.3 (*)    HCT 29.5 (*)    MCV 101.0 (*)    RDW 16.2 (*)    All other components within normal limits  BRAIN NATRIURETIC PEPTIDE - Abnormal; Notable for the following components:   B Natriuretic Peptide 236.5 (*)    All other components within normal limits  RESPIRATORY PANEL BY RT PCR (FLU A&B, COVID)  TROPONIN I (HIGH SENSITIVITY)  TROPONIN I (HIGH SENSITIVITY)    EKG None  Radiology DG Chest 1 View  Result Date: 10/31/2019 CLINICAL DATA:  Shortness of breath, fall. EXAM: CHEST  1 VIEW COMPARISON:  October 21, 2019. FINDINGS: Stable cardiomediastinal silhouette. No pneumothorax or pleural effusion is noted. Left lung is clear. Minimal right basilar subsegmental atelectasis scarring is noted. The visualized skeletal structures are unremarkable. IMPRESSION: No active disease. Electronically Signed   By: Marijo Conception M.D.   On: 10/31/2019 12:16   DG Knee Complete 4 Views Right  Result Date: 10/31/2019 CLINICAL DATA:  Acute right knee pain after fall today. EXAM: RIGHT KNEE - COMPLETE 4+ VIEW COMPARISON:  None. FINDINGS: No evidence of fracture, dislocation, or joint effusion. Moderate narrowing of medial joint space is noted with osteophyte formation. Soft tissues are unremarkable. IMPRESSION: Moderate degenerative  joint disease is noted medially. No acute abnormality seen in the right knee. Electronically Signed   By: Marijo Conception M.D.   On: 10/31/2019 12:17    Procedures Procedures (including critical care time)  Medications Ordered in ED Medications  Chlorhexidine Gluconate Cloth 2 % PADS 6 each (has no administration in time range)  doxercalciferol (HECTOROL) injection 8 mcg (has no administration in time range)  cinacalcet (SENSIPAR) tablet 60 mg (has no administration in time range)  albuterol (VENTOLIN HFA) 108 (90 Base) MCG/ACT inhaler 8 puff (8 puffs Inhalation Given 10/31/19 1354)    ED Course  I have reviewed the triage vital signs and the nursing notes.  Pertinent labs & imaging results that were available during my care of the patient were reviewed by me and considered in my medical decision making (see chart for details).    MDM Rules/Calculators/A&P                      64 year old female with shortness of breath, weakness after missing dialysis on Saturday and today. On presentation to the ER, patient is visibly short of breath, speaking in short sentences, sounding winded when speaking.  Obese, generally edematous and appears fluid overloaded.  She appears mildly lethargic, but is alert and oriented x4.  On physical exam, lung sounds diminished but clear.  She has tenderness to palpation to her right knee but no visible trauma, bruising, deformities.  Reports falling while getting into the car to go to dialysis, no LOC or head trauma.  She is hypertensive, but other vitals nonconcerning.  Ordered basic labs, albuterol, reassess respiratory status.   The emergent differential diagnosis for shortness of breath includes, but is not limited to, Pulmonary edema, bronchoconstriction, Pneumonia, Pulmonary embolism, Pneumotherax/ Hemothorax, Dysrythmia, ACS.   CBC leukocytosis, mildly decreased hemoglobin.  BMP with almost doubled creatinine.  Elevated anion gap of 17, likely due to  uremia..  Glucose 271.  Chest x-Carbo without evidence of fluid overload, knee x-Flax without acute abnormalities.  Initial troponin negative.  EKG normal sinus rhythm.  Patient was seen and evaluated by Dr. Eulis Foster, both agree that she appears fluid overloaded which could be contributing to her weakness and shortness of breath, will likely need dialysis today.  Patient afebrile, normal white count, doubt infectious etiology.  Patient's shortness of breath and weakness could be due to missed dialysis, but may have other causes as well.  Consulted nephrology, they will arrange for dialysis later today.  Consult to hospitalist and they have agreed to admit for further work-up of weakness and shortness of breath.    The patient was seen and evaluated by Dr. Eulis Foster and he is agreeable to the above plan.  Final Clinical Impression(s) / ED Diagnoses Final diagnoses:  SOB (shortness of breath)  Weakness    Rx / DC Orders ED Discharge Orders    None       Lyndel Safe 10/31/19 1532    Daleen Bo, MD 11/01/19 1136

## 2019-10-31 NOTE — ED Provider Notes (Signed)
  Face-to-face evaluation   History: She presents for evaluation of shortness of breath after missing dialysis, several days ago, and a fall today, on her way to dialysis.  She states the fall was initiated by shortness of breath, and weakness.  It occurred as she was trying to climb into a truck, to go to dialysis this morning.  She has been using albuterol for trouble breathing over the last 2 days.  She has had some cough producing mucus and occasionally blood.  She denies focal weakness or paresthesia.  Physical exam: Obese alert and cooperative.  She is generally edematous.  Moist oral mucous membranes.  Lungs with fair air movement bilaterally without audible wheezes rales or rhonchi.  There is no increased work of breathing.  Legs are edematous.  She is able to lift both legs, independently off the stretcher, a few inches, without pain.  1:55 PM-requested callback from nephrology for consideration of dialysis today.  Callback received at 2:07 PM (Dr. Jonnie Finner).  He will arrange for dialysis, later today.  Medical screening examination/treatment/procedure(s) were conducted as a shared visit with non-physician practitioner(s) and myself.  I personally evaluated the patient during the encounter    Daleen Bo, MD 11/01/19 1132

## 2019-10-31 NOTE — Progress Notes (Signed)
Established patient visit    Patient: [name]   DOB: 1955/08/06   64 y.o. Female  MRN: [id number] Visit Date: 11/08/2019  Today's healthcare provider: [name], PA-C   Chief Complaint  Patient presents with  . Hospitalization Follow-up  . Edema  . Foot Pain    Also color change in both feet/toes   Subjective    HPI [name] is a 64 yr old female that presents today for hospital follow up as well as some acute issues/concerns she has.  She was hospitalized on 10/31/19 at [facility] and discharged on 11/01/19. She was hospitalized due to being fluid overloaded with associated SOB that was secondary to a missed HD. She underwent HD and had 4L dialyzed off. Symptoms of SOB improved. She was discharged and advised to make sure to attend all HD appts (on T-Th-Sa schedule).   She was also noted to have bidirectional nystagmus. Does have h/o vertigo and worsens after dialysis treatments. She has had a head CT in 2017 that was unremarkable, but patient did not have vertigo as bad. It was recommended to f/u with outpatient brain MRI to r/o central cause.   Since discharge patient has been compliant with HD sessions and has not had any fluid overload or SOB.   Today she has some acute complaints of bilateral toe pain. The great toes, middle toes, 4th toe and 5th toes all have thickened, discolored nails. She has an appt with Dr. Amalia Hailey, Podiatry, coming up on 11/20/19.   She also mentions she is having claudication type symptoms in her legs bilaterally (R>L). She reports she cannot walk far as her legs will fatigue and sometimes cramp. She reports she cannot walk around [place] to this. She does have peripheral neuropathy which contributes with bilateral foot pain. Possibly more neurogenic claudication but patient has multiple co-morbidities for PAD. She is established with Dr. Ronalee Belts, Vascular surgery, but her care was directed to HD access and complications with her  fistula.   She also continues to have chronic pain in multiple areas. Her left arm is main source of pain as well as her feet. Her left arm is where her fistula access is (basilic vein) and had multiple complications with healing the wound after AV fistula was created. Has been on Tramadol without relief.   Also complains of itching. This is a chronic issue that is exacerbated by ESRD. Has been using Hydroxyzine without relief. Has excoriation marks and some open wounds.   Patient Active Problem List   Diagnosis Date Noted  . Diabetic polyneuropathy associated with type 2 diabetes mellitus ([place]) 11/06/2019  . Hemodialysis status ([place]) 11/06/2019  . Nystagmus 11/06/2019  . Intermittent claudication ([place]) 11/06/2019  . Vertigo of central origin 11/06/2019  . Weakness   . Hypervolemia associated with renal insufficiency 10/31/2019  . Ascending aortic aneurysm ([place]) 03/27/2019  . Mild protein-calorie malnutrition ([place]) 01/25/2019  . Puncture wound without foreign body of left forearm, initial encounter 01/02/2019  . Non-healing wound of upper extremity 12/28/2018  . Unspecified open wound of left upper arm, initial encounter 12/06/2018  . Complication from renal dialysis device 10/20/2018  . Coagulation defect, unspecified ([place]) 10/18/2018  . Iron deficiency anemia, unspecified 10/18/2018  . Secondary hyperparathyroidism of renal origin ([place]) 10/18/2018  . ESRD (end stage renal disease) on dialysis ([place]) 06/01/2018  . ARF (acute renal failure) ([place]) 05/09/2018  . Colon cancer screening   . LGSIL on Pap smear of cervix 01/27/2018  .  Gout 12/25/2017  . AKI (acute kidney injury) ([place]) 12/24/2017  . Hyperglycemia 12/24/2017  . Chronic diastolic heart failure ([place]) 09/30/2017  . Lymphedema 09/30/2017  . Aortic stenosis   . Chronic pain of right knee 02/12/2017  . Chronic gout due to renal impairment of multiple sites without tophus 02/12/2017  . Esophageal dysphagia 02/12/2017  .  Osteoarthritis 01/22/2017  . Diabetic hyperosmolar non-ketotic state ([place]) 12/13/2016  . CAD (coronary artery disease) 12/13/2016  . Hyperlipidemia 12/13/2016  . Hyperphosphatemia 12/13/2016  . Asthma 11/29/2015  . Depression 09/05/2015  . GERD (gastroesophageal reflux disease) 03/25/2015  . Environmental allergies 03/14/2015  . SOB (shortness of breath) 02/15/2015  . Chronic hepatitis C without hepatic coma ([place]) 01/31/2015  . Diabetic neuropathy ([place]) 01/31/2015  . OSA (obstructive sleep apnea) 01/01/2015  . Poor dentition 11/13/2014  . Essential hypertension 10/08/2014  . Morbid (severe) obesity due to excess calories ([place]) 10/08/2014  . Diabetes mellitus type 2, uncontrolled, with complications ([place]) 65/53/7482    Class: Chronic   Past Medical History:  Diagnosis Date  . Anemia   . Aortic stenosis    Echo 8/18: mean 13, peak 28, LVOT/AV mean velocity 0.51  . Arthritis   . Asthma    As a child   . Bronchitis   . CAD (coronary artery disease)    a. 09/2016: 50% Ost 1st Mrg stenosis, 50% 2nd Mrg stenosis, 20% Mid-Cx, 95% Prox LAD, 40% mid-LAD, and 10% dist-LAD stenosis. Staged PCI with DES to Prox-LAD.   [name] Chronic combined systolic and diastolic CHF (congestive heart failure) ([place]) 2011   echo 2/18: EF 55-60, normal wall motion, grade 2 diastolic dysfunction, trivial AI // echo 3/18: Septal and apical HK, EF 45-50, normal wall motion, trivial AI, mild LAE, PASP 38 // echo 8/18: EF 60-65, normal wall motion, grade 1 diastolic dysfunction, calcified aortic valve leaflets, mild aortic stenosis (mean 13, peak 28, LVOT/AV mean velocity 0.51), mild AI, moderate MAC, mild LAE, trivial TR   . Chronic kidney disease    STAGE 4  . Chronic kidney disease on chronic dialysis (HCC)    t, th, sat  . Complication of anesthesia   . Depression   . Diabetes mellitus Dx 1989  . Elevated lipids   . GERD (gastroesophageal reflux disease)   . Gout   . Heart murmur    asymptomatic  . Hepatitis  C Dx 2013  . Hypertension Dx 1989  . Infected surgical wound    Lt arm  . Myocardial infarction ([place]) 07/2015  . Obesity   . Pancreatitis 2013  . Pneumonia   . Refusal of blood transfusions as patient is Jehovah's Witness   . Tendinitis   . Tremors of nervous system    LEFT HAND  . Ulcer 2010   Allergies  Allergen Reactions  . Shellfish Allergy Anaphylaxis and Swelling  . Diazepam Other (See Comments)    "felt like out of body experience"       Medications: Outpatient Medications Prior to Visit  Medication Sig  . Accu-Chek FastClix Lancets MISC USE AS DIRECTED TO CHECK BLOOD SUGAR THREE TIMES DAILY AND AT BEDTIME  . albuterol (VENTOLIN HFA) 108 (90 Base) MCG/ACT inhaler Inhale 2 puffs into the lungs every 4 (four) hours as needed for wheezing or shortness of breath.  . allopurinol (ZYLOPRIM) 100 MG tablet TAKE 1 TABLET BY MOUTH ONCE DAILY. (Patient taking differently: Take 100 mg by mouth daily. )  . aspirin EC 81 MG EC tablet Take 1  tablet (81 mg total) by mouth daily.  [name] atorvastatin (LIPITOR) 80 MG tablet TAKE 1 TABLET BY MOUTH ONCE DAILY AT 6PM (Patient taking differently: Take 80 mg by mouth daily at 6 PM. )  . AURYXIA 1 GM 210 MG(Fe) tablet Take 420 mg by mouth 3 (three) times daily with meals.   [name] b complex-vitamin c-folic acid (NEPHRO-VITE) 0.8 MG TABS tablet Take 1 tablet by mouth daily.  . carvedilol (COREG) 25 MG tablet TAKE (1) TABLET BY MOUTH TWICE A DAY WITH MEALS (BREAKFAST AND SUPPER) (Patient taking differently: Take 25 mg by mouth 3 (three) times daily. )  . Continuous Blood Gluc Receiver (FREESTYLE LIBRE 14 DAY READER) DEVI 1 each by Does not apply route 4 (four) times daily -  before meals and at bedtime.  . Continuous Blood Gluc Sensor (FREESTYLE LIBRE 14 DAY SENSOR) MISC 1 each by Does not apply route every 14 (fourteen) days. Check blood sugar ACHS  . cyclobenzaprine (FLEXERIL) 5 MG tablet Take 1 tablet (5 mg total) by mouth 3 (three) times daily as needed  for muscle spasms.  [name] dicyclomine (BENTYL) 20 MG tablet TAKE 1 TABLET(20 MG) BY MOUTH FOUR TIMES DAILY BEFORE MEALS AND AT BEDTIME  . docusate sodium (COLACE) 100 MG capsule Take 1 capsule (100 mg total) by mouth 2 (two) times daily as needed for mild constipation.  . fluticasone (FLONASE) 50 MCG/ACT nasal spray Place 2 sprays into both nostrils daily as needed for allergies or rhinitis.  . hydrALAZINE (APRESOLINE) 100 MG tablet TAKE (1) TABLET BY MOUTH THREE TIMES DAILY (Patient taking differently: Take 100 mg by mouth 3 (three) times daily. )  . HYDROcodone-acetaminophen (NORCO/VICODIN) 5-325 MG tablet Take 1 tablet by mouth every 6 (six) hours as needed for moderate pain.  [name] lidocaine-prilocaine (EMLA) cream Apply 1 application topically as needed.  . liraglutide (VICTOZA) 18 MG/3ML SOPN Start with 0.79m daily and increase by 0.672mper week until max dose of 1.8 mg dose achieved. (Patient taking differently: Inject 0.6-1.8 mg into the skin See admin instructions. Start with 0.58m82maily and increase by 0.8mg54mr week until max dose of 1.2mg 64me achieved.)  . loratadine (CLARITIN) 10 MG tablet TAKE 1 TABLET BY MOUTH ONCE DAILY. (Patient taking differently: Take 10 mg by mouth at bedtime. )  . mometasone (ELOCON) 0.1 % cream APPLY AS DIRECTED TO AFFECTED AREA ONCE DAILY. (Patient taking differently: Apply 1 application topically daily as needed. )  . montelukast (SINGULAIR) 10 MG tablet TAKE ONE TABLET BY MOUTH AT BEDTIME. (Patient taking differently: Take 10 mg by mouth at bedtime. )  . nitroGLYCERIN (NITROSTAT) 0.4 MG SL tablet Place 1 tablet (0.4 mg total) under the tongue every 5 (five) minutes x 3 doses as needed for chest pain.  . NOVMarland KitchenLOG FLEXPEN 100 UNIT/ML FlexPen USE 25 UNITS UNDER THE SKIN THREE TIMES DAILY WITH MEALS (Patient taking differently: Inject 25 Units into the skin 3 (three) times daily with meals. )  . nystatin cream (MYCOSTATIN) Apply 1 application topically 2 (two) times daily.  (Patient taking differently: Apply 1 application topically daily as needed for dry skin. )  . omeprazole (PRILOSEC) 20 MG capsule TAKE (1) CAPSULE BY MOUTH ONCE DAILY. (Patient taking differently: Take 20 mg by mouth every evening. )  . pregabalin (LYRICA) 150 MG capsule Take 1 capsule (150 mg total) by mouth 2 (two) times daily.  . SENNA PLUS 8.6-50 MG tablet TAKE ONE TABLET BY MOUTH AT BEDTIME. (Patient taking differently: Take 1 tablet  by mouth at bedtime. )  . silver sulfADIAZINE (SILVADENE) 1 % cream Apply 1 application topically daily. Apply to right first toe (Patient taking differently: Apply 1 application topically daily as needed (pain). )  . torsemide (DEMADEX) 20 MG tablet TAKE (2) TABLETS BY MOUTH TWICE DAILY. (Patient taking differently: Take 40 mg by mouth 2 (two) times daily. )  . triamcinolone cream (KENALOG) 0.1 % APPLY TO AFFECTED AREA TWICE DAILY  . valACYclovir (VALTREX) 500 MG tablet TAKE (1) TABLET BY MOUTH EVERY OTHER DAY.  [name] Vitamin D, Ergocalciferol, (DRISDOL) 1.25 MG (50000 UT) CAPS capsule TAKE 1 CAPSULE BY MOUTH ONCE A MONTH (Patient taking differently: Take 50,000 Units by mouth every 30 (thirty) days. )  . [DISCONTINUED] hydrOXYzine (ATARAX/VISTARIL) 25 MG tablet Take 1 tablet (25 mg total) by mouth every 6 (six) hours as needed for itching.  . [DISCONTINUED] LEVEMIR FLEXTOUCH 100 UNIT/ML Pen INJECT 60 UNITS UNDER THE SKIN TWICE DAILY (Patient taking differently: Inject 60 Units into the skin 2 (two) times daily. )  . [DISCONTINUED] mometasone-formoterol (DULERA) 200-5 MCG/ACT AERO Inhale 2 puffs into the lungs 2 (two) times daily.  . [DISCONTINUED] traMADol (ULTRAM) 50 MG tablet Take 1-2 tablets (50-100 mg total) by mouth daily as needed for moderate pain. MAY MAKE DROWSY  . [DISCONTINUED] ULTICARE SHORT PEN NEEDLES 31G X 8 MM MISC USE THREE TIMES DAILY WITH INSULIN  . gentamicin cream (GARAMYCIN) 0.1 % Apply 1 application topically 2 (two) times daily. (Patient taking  differently: Apply 1 application topically daily as needed (itching). )  . albuterol (VENTOLIN HFA) 108 (90 Base) MCG/ACT inhaler 8 puff    No facility-administered medications prior to visit.    Review of Systems  Constitutional: Positive for fatigue. Negative for activity change, appetite change, chills, diaphoresis, fever and unexpected weight change.  Respiratory: Negative.   Cardiovascular: Positive for leg swelling. Negative for chest pain and palpitations.  Gastrointestinal: Positive for diarrhea. Negative for abdominal distention, abdominal pain, anal bleeding, blood in stool, constipation, nausea, rectal pain and vomiting.  Endocrine: Negative for polydipsia, polyphagia and polyuria.  Musculoskeletal: Positive for arthralgias, gait problem and myalgias. Negative for back pain, joint swelling, neck pain and neck stiffness.  Skin: Negative.        Itching  Neurological: Positive for weakness and numbness. Negative for dizziness, light-headedness and headaches.    Last CBC Lab Results  Component Value Date   WBC 8.5 11/01/2019   HGB 9.5 (L) 11/01/2019   HCT 28.6 (L) 11/01/2019   MCV 100.0 11/01/2019   MCH 33.2 11/01/2019   RDW 16.7 (H) 11/01/2019   PLT 155 19/37/9024   Last metabolic panel Lab Results  Component Value Date   GLUCOSE 258 (H) 11/06/2019   NA 138 11/06/2019   K 4.6 11/06/2019   CL 97 11/06/2019   CO2 19 (L) 11/06/2019   BUN 54 (H) 11/06/2019   CREATININE 8.88 (H) 11/06/2019   GFRNONAA 4 (L) 11/06/2019   GFRAA 5 (L) 11/06/2019   CALCIUM 8.3 (L) 11/06/2019   PHOS 7.0 (H) 11/06/2019   PROT 7.7 10/21/2019   ALBUMIN 4.1 11/06/2019   LABGLOB 3.9 03/18/2017   AGRATIO 0.8 03/18/2017   BILITOT 0.3 10/21/2019   ALKPHOS 295 (H) 10/21/2019   AST 39 10/21/2019   ALT 39 10/21/2019   ANIONGAP 18 (H) 11/01/2019   Last hemoglobin A1c Lab Results  Component Value Date   HGBA1C 9.6 (H) 12/28/2018       Objective    BP [name])  125/58 (BP Location: Left Arm,  Patient Position: Sitting, Cuff Size: Large)   Pulse 85   Temp (!) 97.1 F (36.2 C) (Temporal)   Wt 292 lb (132.5 kg)   BMI 44.40 kg/m  BP Readings from Last 3 Encounters:  11/06/19 (!) 125/58  11/01/19 (!) 121/51  10/21/19 (!) 114/57   Wt Readings from Last 3 Encounters:  11/06/19 292 lb (132.5 kg)  11/01/19 264 lb (119.7 kg)  09/19/19 289 lb (131.1 kg)      Physical Exam Vitals reviewed.  Constitutional:      General: She is not in acute distress.    Appearance: Normal appearance. She is well-developed. She is obese. She is not ill-appearing or diaphoretic.  Neck:     Thyroid: No thyromegaly.     Vascular: No JVD.     Trachea: No tracheal deviation.  Cardiovascular:     Rate and Rhythm: Normal rate and regular rhythm.     Pulses:          Dorsalis pedis pulses are 2+ on the right side and 2+ on the left side.       Posterior tibial pulses are 1+ on the right side and 1+ on the left side.     Heart sounds: Murmur present. No friction rub. No gallop.   Pulmonary:     Effort: Pulmonary effort is normal. No respiratory distress.     Breath sounds: Normal breath sounds. No wheezing or rales.  Musculoskeletal:     Cervical back: Normal range of motion and neck supple.     Right foot: Decreased range of motion. No Charcot foot.     Left foot: Decreased range of motion. No Charcot foot.  Feet:     Right foot:     Protective Sensation: 0 sites tested. 10 sites sensed.     Skin integrity: Callus and dry skin present. No ulcer, skin breakdown, erythema, warmth or fissure.     Toenail Condition: Right toenails are abnormally thick and long.     Left foot:     Protective Sensation: 0 sites tested. 10 sites sensed.     Skin integrity: Callus and dry skin present. No fissure.     Toenail Condition: Left toenails are abnormally thick and long.  Lymphadenopathy:     Cervical: No cervical adenopathy.  Neurological:     Mental Status: She is alert.       Results for orders  placed or performed in visit on 11/06/19  Renal Function Panel  Result Value Ref Range   Glucose 258 (H) 65 - 99 mg/dL   BUN 54 (H) 8 - 27 mg/dL   Creatinine, Ser 8.88 (H) 0.57 - 1.00 mg/dL   GFR calc non Af Amer 4 (L) >59 mL/min/1.73   GFR calc Af Amer 5 (L) >59 mL/min/1.73   BUN/Creatinine Ratio 6 (L) 12 - 28   Sodium 138 134 - 144 mmol/L   Potassium 4.6 3.5 - 5.2 mmol/L   Chloride 97 96 - 106 mmol/L   CO2 19 (L) 20 - 29 mmol/L   Calcium 8.3 (L) 8.7 - 10.3 mg/dL   Phosphorus 7.0 (H) 3.0 - 4.3 mg/dL   Albumin 4.1 3.8 - 4.8 g/dL     Assessment & Plan     1. Diabetes mellitus type 2, uncontrolled, with complications ([place]) Followed by Endocrinology. Patient misplaced needles for insulin pen. This was refilled as below. Discussed importance of continuing to work on control of T2DM. Will also change  tramadol to percocet as noted below for pain from neuropathy. Patient already on max dose of Lyrica. Will check labs as below. HD due tomorrow.  - Insulin Pen Needle (ULTICARE SHORT PEN NEEDLES) 31G X 8 MM MISC; USE THREE TIMES DAILY WITH INSULIN  Dispense: 100 each; Refill: 5 - oxyCODONE-acetaminophen (PERCOCET/ROXICET) 5-325 MG tablet; Take 1 tablet by mouth every 8 (eight) hours as needed for severe pain.  Dispense: 90 tablet; Refill: 0 - Renal Function Panel  2. Muscle spasm of back Chronic issue. Uses Cyclobenzaprine 62m TID prn.   3. Uncomplicated asthma, unspecified asthma severity, unspecified whether persistent Stable. [facility] sent [facility].   4. Pruritic condition Worsening. Complicated by ESRD and TF0YDuncontrolled. Failed Hydroxyzine. Change to Doxepin.   5. Vertigo of central origin Has been becoming more frequent. Has vertigo intermittently after HD. Bydirectional Nystagmus noted. Will try to get MR approved.  - MR Brain Wo Contrast; Future  6. Intermittent claudication (HCC) Suspected claudication, but could very well be neurogenic  due to uncontrolled T2DM and body habitus. Pedal pulses were palpable. Will refer to Dr. SRonalee Beltsas below to r/o vascular source as patient is very high risk for PAD source.  - Ambulatory referral to Vascular Surgery  7. Nystagmus See above medical treatment plan for # 5.  - MR Brain Wo Contrast; Future  8. [place]od[place] ([place]. Discussed importance of always attending, not missing scheduled days. Discussed transportation assistance if needed.  - Renal Function Panel  9. ESRD (end stage renal disease) on dialysis ([facility] See above medical treatment plan. - Renal Function Panel  10. Diabetic polyneuropathy associated with type 2 diabetes mellitus (HCC) On Lyrica. See above medical treatment plan.  I spent approximately 45 minutes with the patient today. Over 50% of this time was spent with counseling and educating the patient.  Return in about 3 months (around 02/05/2020).      IReynolds Bowl PA-C, have reviewed all documentation for this visit. The documentation on 11/08/19 for the exam, diagnosis, procedures, and orders are all accurate and complete.   JRubye Beach [phone](phone) [phone](fax)  [place]

## 2019-10-31 NOTE — H&P (Signed)
Date: 10/31/2019               Patient Name:  [name] MRN: [id number]  DOB: 03-07-1956 Age / Sex: 64 y.o., female   PCP: [name], PA-C         Medical Service: Internal Medicine Teaching Service         Attending Physician: Dr. Sid Falcon, MD    First Contact: Dr. Benjamine Mola Pager: [phone]  Second Contact: Dr. Truman Hayward Pager: [phone]       After Hours (After 5p/  First Contact Pager: [phone]  weekends / holidays): Second Contact Pager: [phone]   Chief Complaint: Increased shortness of breath  History of Present Illness:  Patient is a 64 year old female with past medical history significant for type 2 diabetes mellitus, hypertension, congestive heart failure with preserved EF, ESRD on dialysis Tuesday, Thursday, Saturday, COPD who presented to ER on 11/04/2019 with increasing shortness of breath starting yesterday.  Patient reports missing HD session on Saturday due to family emergency.  Reports episode of weakness today where she had to go down on right knee, denies loss of consciousness.   She was in her usual state of health until this morning when she was trying to get into her truck to get to dialysis when her legs gave out and had to kneel with significant shortness of breath. She mentions also some chest congestion with non-purulent cough. She could not pull herself up due to weakness and asked her son to call 911.  On review of systems, she endorse 2 pillow orthopnea. Denies any chest pain, palpitations, fever or chills. Light-headedness with standing. Mentions history of weak knees.  Meds:  Endocrine: *Novolog 25 units three times daily with meals *Levemir 60 units twice daily (last taken 4/19) *Liraglutide (Victoza) - 0.6 mg daily, increase by 0.6 mg every week up to 1.8 mg daily, currently on 1.8 mg daily dose per patient Respiratory: *Albuterol inhaler PRN *Dulera 2 puffs twice daily (Out of Dulera) *Flonase PRN *Loratadine 10 mg at  bedtime Cardiac: *Aspirin 81 mg daily *Atorvastatin 80 mg daily *Carvedilol 25 mg twice daily with meals *Hydralazine 100 mg three times daily *Nitro 0.4 mg PRN *Torsemide 40 mg every other day Anti-Infective: *Valtrex 500 mg 1 tablet every other day GI: *Dicyclomine (bentyl) 20 mg four times daily before meals and bedtime *Colace 100 mg twice daily PRN *Omeprazole 20 mg every evening *Senna - 1 tablet at bedtime Analgesic: *Hydrocodone-acetaminophen Q6HR PRN *Pregabalin 150 mg twice daily *Tramadol 50-100 mg PRN Other: *Allopurinol 100 mg once daily *Hydroxizine 25 mg Q6HR PRN itching *Auryxia (ferric citrate) 420 mg three times daily with meals *Vitamin D supplements  Allergies: Allergies as of 10/31/2019 - Review Complete 10/31/2019  Allergen Reaction Noted  . Shellfish allergy Anaphylaxis and Swelling 05/14/2012  . Diazepam Other (See Comments) 10/07/2015   Past Medical History:  Diagnosis Date  . Anemia   . Aortic stenosis    Echo 8/18: mean 13, peak 28, LVOT/AV mean velocity 0.51  . Arthritis   . Asthma    As a child   . Bronchitis   . CAD (coronary artery disease)    a. 09/2016: 50% Ost 1st Mrg stenosis, 50% 2nd Mrg stenosis, 20% Mid-Cx, 95% Prox LAD, 40% mid-LAD, and 10% dist-LAD stenosis. Staged PCI with DES to Prox-LAD.   [name] Chronic combined systolic and diastolic CHF (congestive heart failure) ([place]) 2011   echo 2/18: EF 55-60, normal wall motion, grade 2  diastolic dysfunction, trivial AI // echo 3/18: Septal and apical HK, EF 45-50, normal wall motion, trivial AI, mild LAE, PASP 38 // echo 8/18: EF 60-65, normal wall motion, grade 1 diastolic dysfunction, calcified aortic valve leaflets, mild aortic stenosis (mean 13, peak 28, LVOT/AV mean velocity 0.51), mild AI, moderate MAC, mild LAE, trivial TR   . Chronic kidney disease    STAGE 4  . Chronic kidney disease on chronic dialysis (HCC)    t, th, sat  . Complication of anesthesia   . Depression   . Diabetes  mellitus Dx 1989  . Elevated lipids   . GERD (gastroesophageal reflux disease)   . Gout   . Heart murmur    asymptomatic  . Hepatitis C Dx 2013  . Hypertension Dx 1989  . Infected surgical wound    Lt arm  . Myocardial infarction ([place]) 07/2015  . Obesity   . Pancreatitis 2013  . Pneumonia   . Refusal of blood transfusions as patient is Jehovah's Witness   . Tendinitis   . Tremors of nervous system    LEFT HAND  . Ulcer 2010   Family History:  Family History  Problem Relation Age of Onset  . Colon cancer Mother   . Heart attack Other   . Heart attack Maternal Grandmother   . Hypertension Sister   . Hypertension Brother   . Diabetes Paternal Grandmother   . Breast cancer Neg Hx    Social History:  Denies tobacco use. Drinks a drink every 6-7 months. Mentions smoking marijuana but denies other illicit substance use. Lives with daughter and her fiance.  Review of Systems: A complete ROS was negative except as per HPI.   CBC Latest Ref Rng & Units 10/31/2019 10/21/2019 06/29/2019  WBC 4.0 - 10.5 K/uL 8.7 10.0 7.9  Hemoglobin 12.0 - 15.0 g/dL 9.3(L) 10.1(L) 11.2(L)  Hematocrit 36.0 - 46.0 % 29.5(L) 31.0(L) 34.9(L)  Platelets 150 - 400 K/uL 168 184 174   BMP Latest Ref Rng & Units 10/31/2019 10/21/2019 06/29/2019  Glucose 70 - 99 mg/dL 271(H) 136(H) 302(H)  BUN 8 - 23 mg/dL 126(H) 30(H) 27(H)  Creatinine 0.44 - 1.00 mg/dL 9.79(H) 4.82(H) 4.83(H)  BUN/Creat Ratio 9 - 23 - - -  Sodium 135 - 145 mmol/L 134(L) 136 133(L)  Potassium 3.5 - 5.1 mmol/L 5.1 4.1 3.6  Chloride 98 - 111 mmol/L 101 97(L) 92(L)  CO2 22 - 32 mmol/L 16(L) 26 27  Calcium 8.9 - 10.3 mg/dL 7.9(L) 9.5 8.5(L)    Physical Exam: Blood pressure (!) 149/79, pulse 75, temperature 98 F (36.7 C), temperature source Oral, resp. rate 11, SpO2 98 %. Physical Exam  Constitutional: She is well-developed, well-nourished, and in no distress.  HENT:  Head: Normocephalic and atraumatic.  Eyes: EOM are normal. Right  eye exhibits no discharge. Left eye exhibits no discharge.  Neck: No tracheal deviation present.  Cardiovascular: Normal rate and regular rhythm. Exam reveals no gallop and no friction rub.  No murmur heard. Pulmonary/Chest: Effort normal and breath sounds normal. No respiratory distress. She has no wheezes. She has no rales.  Abdominal: Soft. She exhibits distension (mild). There is no abdominal tenderness. There is no rebound and no guarding.  Musculoskeletal:        General: No tenderness, deformity or edema. Normal range of motion.     Cervical back: Normal range of motion.     Comments: Skin of bilateral lower extremities tense but no pitting edema  Neurological: She is  alert. No cranial nerve deficit. Coordination normal.  Nystagmus present on exam  Skin: Skin is warm and dry. No rash noted. She is not diaphoretic. No erythema.  Psychiatric: Memory and judgment normal.   EKG: personally reviewed my interpretation is NSR without evidence for acute ischemia  CXR: personally reviewed my interpretation is increased prominence for pulmonary vasculature, kerley-b lines present  Assessment & Plan by Problem: Active Problems:   Hypervolemia associated with renal insufficiency  Patient is a 64 year old female with past medical history significant for type 2 diabetes mellitus, hypertension, congestive heart failure with preserved EF, ESRD on dialysis Tuesday, Thursday, Saturday, COPD who presented to ER on 11/04/2019 with increasing shortness of breath starting day prior to admission.  # ESRD on HD TRS:  Patient symptoms likely secondary to volume overload from missed HD session. Patient with last HD session on Thursday 10/26/19. Nephrology consulted, plan for HD today. Body mass of 128.5 kg on 08/10/2019, no new weight yet.  *HD today, nephrology consulted we appreciate their recommendations *Zofran PRN for nausea *Cinacalcet, doxercalciferol as scheduled with HD  # T2DM: Last hemoglobin A1c  of 9.6 in June 2020. Glucose of 271 on presentation. Home regimen of levemir 60 units twice daily + novolog 25 units three times daily with meals + liraglutide 1.8 mg daily. Levemir last taken last night. Will continue home regimen *Levemir 48 units twice daily + aspart 10 units with meals + SSI  # COPD: Home regimen of Dulera 200-5, 2 puffs twice daily, albuterol inhaler PRN  *Continue Dulera + albuterol PRN  # HFpEF: # HTN: Last echocardiogram January 2020, EF estimated 16-60%, grade 1 diastolic dysfunction. Hemodynamically stable. Last BP of 149/79. Continue home regimn *Aspirin 81 mg daily + atorvastatin 80 mg daily *Carvedilol 25 mg twice daily with meals + hydralazine 100 mg three times daily *Nitro PRN  # GERD: Continue PPI  # Diarrhea: Continue bentyl 20 mg four times daily before meals and bedtime  # Chronic pain: On percocet, tramadol, lyrica. Will hold for now given ESRD, absence of pain on evaluation  PT/OT: Consulted DVT Ppx: Heparin 5000 U Q8HRs Status: Observation CODE STATUS: FULL CODE Dispo: Admit patient to Observation with expected length of stay less than 2 midnights.  Signed: Jeanmarie Hubert, MD 10/31/2019, 4:13 PM

## 2019-11-01 ENCOUNTER — Encounter (HOSPITAL_COMMUNITY): Payer: Self-pay | Admitting: Internal Medicine

## 2019-11-01 DIAGNOSIS — E8770 Fluid overload, unspecified: Secondary | ICD-10-CM

## 2019-11-01 DIAGNOSIS — Z992 Dependence on renal dialysis: Secondary | ICD-10-CM | POA: Diagnosis not present

## 2019-11-01 DIAGNOSIS — I5030 Unspecified diastolic (congestive) heart failure: Secondary | ICD-10-CM | POA: Diagnosis not present

## 2019-11-01 DIAGNOSIS — G8929 Other chronic pain: Secondary | ICD-10-CM | POA: Diagnosis not present

## 2019-11-01 DIAGNOSIS — E1122 Type 2 diabetes mellitus with diabetic chronic kidney disease: Secondary | ICD-10-CM | POA: Diagnosis not present

## 2019-11-01 DIAGNOSIS — R197 Diarrhea, unspecified: Secondary | ICD-10-CM | POA: Diagnosis not present

## 2019-11-01 DIAGNOSIS — J449 Chronic obstructive pulmonary disease, unspecified: Secondary | ICD-10-CM | POA: Diagnosis not present

## 2019-11-01 DIAGNOSIS — H5509 Other forms of nystagmus: Secondary | ICD-10-CM

## 2019-11-01 DIAGNOSIS — R531 Weakness: Secondary | ICD-10-CM

## 2019-11-01 DIAGNOSIS — Z9115 Patient's noncompliance with renal dialysis: Secondary | ICD-10-CM | POA: Diagnosis not present

## 2019-11-01 DIAGNOSIS — R0602 Shortness of breath: Secondary | ICD-10-CM | POA: Diagnosis not present

## 2019-11-01 DIAGNOSIS — I132 Hypertensive heart and chronic kidney disease with heart failure and with stage 5 chronic kidney disease, or end stage renal disease: Secondary | ICD-10-CM | POA: Diagnosis not present

## 2019-11-01 DIAGNOSIS — N186 End stage renal disease: Secondary | ICD-10-CM | POA: Diagnosis not present

## 2019-11-01 DIAGNOSIS — K219 Gastro-esophageal reflux disease without esophagitis: Secondary | ICD-10-CM | POA: Diagnosis not present

## 2019-11-01 LAB — CBC
HCT: 28.6 % — ABNORMAL LOW (ref 36.0–46.0)
Hemoglobin: 9.5 g/dL — ABNORMAL LOW (ref 12.0–15.0)
MCH: 33.2 pg (ref 26.0–34.0)
MCHC: 33.2 g/dL (ref 30.0–36.0)
MCV: 100 fL (ref 80.0–100.0)
Platelets: 155 10*3/uL (ref 150–400)
RBC: 2.86 MIL/uL — ABNORMAL LOW (ref 3.87–5.11)
RDW: 16.7 % — ABNORMAL HIGH (ref 11.5–15.5)
WBC: 8.5 10*3/uL (ref 4.0–10.5)
nRBC: 0 % (ref 0.0–0.2)

## 2019-11-01 LAB — RENAL FUNCTION PANEL
Albumin: 3 g/dL — ABNORMAL LOW (ref 3.5–5.0)
Anion gap: 18 — ABNORMAL HIGH (ref 5–15)
BUN: 67 mg/dL — ABNORMAL HIGH (ref 8–23)
CO2: 19 mmol/L — ABNORMAL LOW (ref 22–32)
Calcium: 7.9 mg/dL — ABNORMAL LOW (ref 8.9–10.3)
Chloride: 96 mmol/L — ABNORMAL LOW (ref 98–111)
Creatinine, Ser: 6.16 mg/dL — ABNORMAL HIGH (ref 0.44–1.00)
GFR calc Af Amer: 8 mL/min — ABNORMAL LOW (ref >60)
GFR calc non Af Amer: 7 mL/min — ABNORMAL LOW (ref >60)
Glucose, Bld: 319 mg/dL — ABNORMAL HIGH (ref 70–99)
Phosphorus: 5.5 mg/dL — ABNORMAL HIGH (ref 2.5–4.6)
Potassium: 4.3 mmol/L (ref 3.5–5.1)
Sodium: 133 mmol/L — ABNORMAL LOW (ref 135–145)

## 2019-11-01 LAB — GLUCOSE, CAPILLARY
Glucose-Capillary: 357 mg/dL — ABNORMAL HIGH (ref 70–99)
Glucose-Capillary: 256 mg/dL — ABNORMAL HIGH (ref 70–99)

## 2019-11-01 MED ORDER — LIDOCAINE-PRILOCAINE 2.5-2.5 % EX CREA: 1.0000 "application " | TOPICAL_CREAM | CUTANEOUS | Status: DC | PRN

## 2019-11-01 MED ORDER — ALTEPLASE 2 MG IJ SOLR: 2.0000 mg | Freq: Once | INTRAMUSCULAR | Status: DC | PRN

## 2019-11-01 MED ORDER — SODIUM CHLORIDE 0.9 % IV SOLN: 100.0000 mL | INTRAVENOUS | Status: DC | PRN

## 2019-11-01 MED ORDER — TORSEMIDE 20 MG PO TABS
40.0000 mg | ORAL_TABLET | Freq: Two times a day (BID) | ORAL | Status: DC
  Administered 2019-11-01: 40 mg via ORAL
  Filled 2019-11-01: qty 2

## 2019-11-01 MED ORDER — LOPERAMIDE HCL 2 MG PO CAPS
2.0000 mg | ORAL_CAPSULE | ORAL | Status: DC | PRN
  Administered 2019-11-01: 2 mg via ORAL
  Filled 2019-11-01: qty 1

## 2019-11-01 MED ORDER — LIDOCAINE HCL (PF) 1 % IJ SOLN: 5.0000 mL | INTRAMUSCULAR | Status: DC | PRN

## 2019-11-01 MED ORDER — PENTAFLUOROPROP-TETRAFLUOROETH EX AERO: 1.0000 "application " | INHALATION_SPRAY | CUTANEOUS | Status: DC | PRN

## 2019-11-01 MED ORDER — HEPARIN SODIUM (PORCINE) 1000 UNIT/ML DIALYSIS: 6000.0000 [IU] | Freq: Once | INTRAMUSCULAR | Status: AC

## 2019-11-01 MED ORDER — HEPARIN SODIUM (PORCINE) 1000 UNIT/ML DIALYSIS: 1000.0000 [IU] | INTRAMUSCULAR | Status: DC | PRN

## 2019-11-01 NOTE — Progress Notes (Signed)
Subjective:  Patient seen at bedside. Patient states she had couple episodes of diarrhea, she reports this is a chronic issue. Patient states she had coughing fit yesterday and vomited mostly sputum. Patient denies current pain.  Objective:    Vital Signs (last 24 hours): Vitals:   10/31/19 2353 11/01/19 0006 11/01/19 0519 11/01/19 0729  BP: (!) 148/67  (!) 123/59 118/60  Pulse: 91  87 84  Resp: 19  19 18   Temp: 98.7 F (37.1 C)  98.9 F (37.2 C) 98 F (36.7 C)  TempSrc: Oral  Oral   SpO2:   95% 97%  Weight:  119.7 kg    Height:  5\' 8"  (1.727 m)     Physical Exam: General Alert and answers questions appropriately, no acute distress  Cardiac Regular rate and rhythm, no murmurs, rubs, or gallops  Pulmonary Clear to auscultation bilaterally without wheezes, rhonchi, or rales  Neurologic Bidirectional nystagmus, negative head impulse test  Extremities Lower extremity swelling decreased from yesterday    CBC Latest Ref Rng & Units 11/01/2019 10/31/2019 10/21/2019  WBC 4.0 - 10.5 K/uL 8.5 8.7 10.0  Hemoglobin 12.0 - 15.0 g/dL 9.5(L) 9.3(L) 10.1(L)  Hematocrit 36.0 - 46.0 % 28.6(L) 29.5(L) 31.0(L)  Platelets 150 - 400 K/uL 155 168 184   BMP Latest Ref Rng & Units 11/01/2019 10/31/2019 10/21/2019  Glucose 70 - 99 mg/dL 319(H) 271(H) 136(H)  BUN 8 - 23 mg/dL 67(H) 126(H) 30(H)  Creatinine 0.44 - 1.00 mg/dL 6.16(H) 9.79(H) 4.82(H)  BUN/Creat Ratio 9 - 23 - - -  Sodium 135 - 145 mmol/L 133(L) 134(L) 136  Potassium 3.5 - 5.1 mmol/L 4.3 5.1 4.1  Chloride 98 - 111 mmol/L 96(L) 101 97(L)  CO2 22 - 32 mmol/L 19(L) 16(L) 26  Calcium 8.9 - 10.3 mg/dL 7.9(L) 7.9(L) 9.5   BNP: 236.5 Troponin: 9 -> 8  Assessment/Plan:   Active Problems:   Hypervolemia associated with renal insufficiency  Patient is a 64 year old female with past medical history significant for type 2 diabetes mellitus, hypertension, congestive heart failure with preserved EF, ESRD on dialysis Tuesday, Thursday, Saturday,  COPD who presented to the emergency room on 10/31/2019 with increased shortness of breath starting day prior to admission.  # ESRD on HD TRS: # Shortness of breath Patient symptoms likely secondary to volume overload from missed HD session. HD performed yesterday (4/20) with Net UF of 4000 mL. Labs this AM reveal BUN of 67 (from 126), potassium of 4.3 (from 5.1). Body mass of 119.7 kg this AM, from 128.5 kg on 08/10/19. *HD per nephro, we appreciate their assistance *Zofran PRN for nausea *Hectorol, sensipar, mircera, venofer per nephro recommendations  # T2DM: Last hemoglobin A1c of 9.6 in June 2020. Glucose of 271 on presentation. Home regimen of levemir 60 units twice daily + novolog 25 units three times daily with meals + liraglutide 1.8 mg daily. Given levemir 48 units last night, 15 units aspart this AM *Continue levemir 48 units twice daily + aspart 10 units with meals + SSI. Consider increase towards home regimen if continued non-control  # COPD: Home regimen of Dulera 200-5, 2 puffs twice daily, albuterol inhaler PRN  *Continue Dulera + albuterol PRN  # HFpEF: # HTN: Last echocardiogram January 2020, EF estimated 65-99%, grade 1 diastolic dysfunction. Hemodynamically stable. Last BP of 149/79. Continue home regimn *Aspirin 81 mg daily + atorvastatin 80 mg daily *Carvedilol 25 mg twice daily with meals + hydralazine 100 mg three times daily *Nitro PRN *Torsemide 40  mg PO twice daily - initially held, restarting per nephrology recommendation  # GERD: Continue PPI  # Diarrhea: Continue bentyl 20 mg four times daily before meals and bedtime. Started PRN loperamide today  # Chronic pain: On percocet, tramadol, lyrica. Will hold for now given ESRD, absence of pain on evaluation  PT/OT: Consulted for imminent discharge Diet: Renal/carb modified DVT Ppx: Heparin 5000 units every 8 hours Admit Status: Observation Dispo: Anticipated discharge today  Heather Hubert,  MD 11/01/2019, 7:37 AM

## 2019-11-01 NOTE — Discharge Summary (Signed)
Name: [name] MRN: [id number] DOB: March 17, 1956 64 y.o. PCP: [facility]  Date of Admission: 10/31/2019  7:23 AM Date of Discharge: 11/01/2019 Attending Physician: Dr. Daryll Drown Discharge Diagnosis: 1. Volume overload secondary to end-stage renal disease 2. Bidirectional nystagmus  Discharge Medications: Allergies as of 11/01/2019      Reactions   Shellfish Allergy Anaphylaxis, Swelling   Diazepam Other (See Comments)   "felt like out of body experience"      Medication List    STOP taking these medications   cephALEXin 500 MG capsule Commonly known as: KEFLEX   doxycycline 100 MG tablet Commonly known as: VIBRA-TABS   lidocaine 5 % Commonly known as: Lidoderm   methylPREDNISolone 4 MG Tbpk tablet Commonly known as: Medrol     TAKE these medications   Accu-Chek FastClix Lancets Misc USE AS DIRECTED TO CHECK BLOOD SUGAR THREE TIMES DAILY AND AT BEDTIME   albuterol 108 (90 Base) MCG/ACT inhaler Commonly known as: VENTOLIN HFA Inhale 2 puffs into the lungs every 4 (four) hours as needed for wheezing or shortness of breath.   allopurinol 100 MG tablet Commonly known as: ZYLOPRIM TAKE 1 TABLET BY MOUTH ONCE DAILY.   aspirin 81 MG EC tablet Take 1 tablet (81 mg total) by mouth daily.   atorvastatin 80 MG tablet Commonly known as: LIPITOR TAKE 1 TABLET BY MOUTH ONCE DAILY AT 6PM What changed: See the new instructions.   Auryxia 1 GM 210 MG(Fe) tablet Generic drug: ferric citrate Take 420 mg by mouth 3 (three) times daily with meals.   b complex-vitamin c-folic acid 0.8 MG Tabs tablet Take 1 tablet by mouth daily.   carvedilol 25 MG tablet Commonly known as: COREG TAKE (1) TABLET BY MOUTH TWICE A DAY WITH MEALS (BREAKFAST AND SUPPER) What changed: See the new instructions.   cyclobenzaprine 5 MG tablet Commonly known as: FLEXERIL Take 1 tablet (5 mg total) by mouth 3 (three) times daily as needed for muscle spasms.   dicyclomine 20 MG  tablet Commonly known as: BENTYL TAKE 1 TABLET(20 MG) BY MOUTH FOUR TIMES DAILY BEFORE MEALS AND AT BEDTIME   docusate sodium 100 MG capsule Commonly known as: COLACE Take 1 capsule (100 mg total) by mouth 2 (two) times daily as needed for mild constipation.   fluticasone 50 MCG/ACT nasal spray Commonly known as: FLONASE Place 2 sprays into both nostrils daily as needed for allergies or rhinitis.   FreeStyle Libre 14 Day Reader Kerrin Mo 1 each by Does not apply route 4 (four) times daily -  before meals and at bedtime.   FreeStyle Libre 14 Day Sensor Misc 1 each by Does not apply route every 14 (fourteen) days. Check blood sugar ACHS   gentamicin cream 0.1 % Commonly known as: GARAMYCIN Apply 1 application topically 2 (two) times daily. What changed:   when to take this  reasons to take this   hydrALAZINE 100 MG tablet Commonly known as: APRESOLINE TAKE (1) TABLET BY MOUTH THREE TIMES DAILY What changed: See the new instructions.   HYDROcodone-acetaminophen 5-325 MG tablet Commonly known as: NORCO/VICODIN Take 1 tablet by mouth every 6 (six) hours as needed for moderate pain.   hydrOXYzine 25 MG tablet Commonly known as: ATARAX/VISTARIL Take 1 tablet (25 mg total) by mouth every 6 (six) hours as needed for itching.   Levemir FlexTouch 100 UNIT/ML FlexPen Generic drug: insulin detemir INJECT 60 UNITS UNDER THE SKIN TWICE DAILY What changed: See the new instructions.  lidocaine-prilocaine cream Commonly known as: EMLA Apply 1 application topically as needed.   loratadine 10 MG tablet Commonly known as: CLARITIN TAKE 1 TABLET BY MOUTH ONCE DAILY. What changed: when to take this   mometasone 0.1 % cream Commonly known as: ELOCON APPLY AS DIRECTED TO AFFECTED AREA ONCE DAILY. What changed: See the new instructions.   mometasone-formoterol 200-5 MCG/ACT Aero Commonly known as: DULERA Inhale 2 puffs into the lungs 2 (two) times daily.   montelukast 10 MG  tablet Commonly known as: SINGULAIR TAKE ONE TABLET BY MOUTH AT BEDTIME.   nitroGLYCERIN 0.4 MG SL tablet Commonly known as: NITROSTAT Place 1 tablet (0.4 mg total) under the tongue every 5 (five) minutes x 3 doses as needed for chest pain.   NovoLOG FlexPen 100 UNIT/ML FlexPen Generic drug: insulin aspart USE 25 UNITS UNDER THE SKIN THREE TIMES DAILY WITH MEALS What changed: See the new instructions.   nystatin cream Commonly known as: MYCOSTATIN Apply 1 application topically 2 (two) times daily. What changed:   when to take this  reasons to take this   omeprazole 20 MG capsule Commonly known as: PRILOSEC TAKE (1) CAPSULE BY MOUTH ONCE DAILY. What changed: See the new instructions.   pregabalin 150 MG capsule Commonly known as: Lyrica Take 1 capsule (150 mg total) by mouth 2 (two) times daily.   Senna Plus 8.6-50 MG tablet Generic drug: senna-docusate TAKE ONE TABLET BY MOUTH AT BEDTIME.   silver sulfADIAZINE 1 % cream Commonly known as: SILVADENE Apply 1 application topically daily. Apply to right first toe What changed: See the new instructions.   torsemide 20 MG tablet Commonly known as: DEMADEX TAKE (2) TABLETS BY MOUTH TWICE DAILY. What changed: See the new instructions.   traMADol 50 MG tablet Commonly known as: ULTRAM Take 1-2 tablets (50-100 mg total) by mouth daily as needed for moderate pain. MAY MAKE DROWSY   triamcinolone cream 0.1 % Commonly known as: KENALOG APPLY TO AFFECTED AREA TWICE DAILY   UltiCare Short Pen Needles 31G X 8 MM Misc Generic drug: Insulin Pen Needle USE THREE TIMES DAILY WITH INSULIN   valACYclovir 500 MG tablet Commonly known as: VALTREX TAKE (1) TABLET BY MOUTH EVERY OTHER DAY.   Victoza 18 MG/3ML Sopn Generic drug: liraglutide Start with 0.6mg  daily and increase by 0.6mg  per week until max dose of 1.8 mg dose achieved. What changed:   how much to take  how to take this  when to take this  additional  instructions   Vitamin D (Ergocalciferol) 1.25 MG (50000 UNIT) Caps capsule Commonly known as: DRISDOL TAKE 1 CAPSULE BY MOUTH ONCE A MONTH What changed: See the new instructions.            Durable Medical Equipment  (From admission, onward)         Start     Ordered   11/01/19 1142  For home use only DME Walker  Once    Question Answer Comment  Patient needs a walker to treat with the following condition Muscular deconditioning   Patient needs a walker to treat with the following condition ESRD (end stage renal disease) ([place])      11/01/19 1142          Disposition and follow-up:   Ms.Reshunda M Savastano was discharged from [facility] in Stable condition.  At the hospital follow up visit please address:  1.  Please ensure patient has been able to attend HD sessions. Please assess patient  for recurrent increased shortness of breath. Assess volume status.  Please consider further workup for patient's bidirectional nystagmus, concerning for central process. Please see problem below.  2.  Labs / imaging needed at time of follow-up: Renal function panel  3.  Pending labs/ test needing follow-up: None  Follow-up Appointments: Follow-up Information    [name], PA-C Follow up.   Specialty: Family Medicine Why: Please followup with your primary care provider in 1 week Contact information: [address] [place] [address] [phone]        Sherren Mocha, MD On 11/16/2019.   Specialty: Cardiology Why: @1 :45pm Contact information: [address] [place] [address] [phone]        [facility], [facility] [employer].   Why: rolling walker Contact information: [address] [place] [address] [phone]           Hospital Course by problem list:  # Shortness of breath # Volume overload 2/2 ESRD on HD Tuesday/Thursday/Saturday Patient is a 64 year old female with past medical history  significant for type 2 diabetes mellitus, hypertension, congestive heart failure with preserved EF, ESRD on dialysis Tuesday/Thursday/Saturday and COPD who presented to the emergency room on 11/04/2019 with increased shortness of breath starting day prior to admission.  Patient was volume up on exam in the setting of missing HD session on Saturday, 10/28/2019.  Patient received hemodialysis on 4/20, net UF 4 L.  On day of discharge, patient body mass of 119.7 kg, patient with improved symptoms and breathing comfortably on room air.  # Bidirectional nystagmus: Bidirectional horizontal nystagmus noted on exam both before and after HD. Patient notes episodic dizziness. CT head from March 2017 did not show acute/malignant intracranial process. Patient may benefit from further evaluation/consideration of MR brain.  Discharge Vitals:   BP (!) 121/51 (BP Location: Right Arm)   Pulse 75   Temp 99.3 F (37.4 C) (Oral)   Resp 16   Ht 5\' 8"  (1.727 m)   Wt 119.7 kg   SpO2 96%   BMI 40.14 kg/m   Pertinent Labs, Studies, and Procedures:  CBC Latest Ref Rng & Units 11/01/2019 10/31/2019 10/21/2019  WBC 4.0 - 10.5 K/uL 8.5 8.7 10.0  Hemoglobin 12.0 - 15.0 g/dL 9.5(L) 9.3(L) 10.1(L)  Hematocrit 36.0 - 46.0 % 28.6(L) 29.5(L) 31.0(L)  Platelets 150 - 400 K/uL 155 168 184   BMP Latest Ref Rng & Units 11/01/2019 10/31/2019 10/21/2019  Glucose 70 - 99 mg/dL 319(H) 271(H) 136(H)  BUN 8 - 23 mg/dL 67(H) 126(H) 30(H)  Creatinine 0.44 - 1.00 mg/dL 6.16(H) 9.79(H) 4.82(H)  BUN/Creat Ratio 9 - 23 - - -  Sodium 135 - 145 mmol/L 133(L) 134(L) 136  Potassium 3.5 - 5.1 mmol/L 4.3 5.1 4.1  Chloride 98 - 111 mmol/L 96(L) 101 97(L)  CO2 22 - 32 mmol/L 19(L) 16(L) 26  Calcium 8.9 - 10.3 mg/dL 7.9(L) 7.9(L) 9.5   CXR (10/31/19): FINDINGS: Stable cardiomediastinal silhouette. No pneumothorax or pleural effusion is noted. Left lung is clear. Minimal right basilar subsegmental atelectasis scarring is noted. The visualized  skeletal structures are unremarkable.  IMPRESSION: No active disease.  Discharge Instructions: Discharge Instructions    Diet - low sodium heart healthy   Complete by: As directed    Discharge instructions   Complete by: As directed    You were seen in the hospital for weakness. This was most likely because you had too much fluid on you. Your dialysis sessions will be the  best way to help avoid gaining too much fluid. We have not made any changes to your medications. Please followup with your primary care provider in approximately 1 week.  Thank you for allowing [place] to be part of your medical care!   Increase activity slowly   Complete by: As directed      Signed: Jeanmarie Hubert, MD 11/01/2019, 2:22 PM

## 2019-11-01 NOTE — Progress Notes (Signed)
Discharge education and medication education given to patient with teach back. Education on increasing activity slowly, when to call MD, and low sodium heart healthy diet given. All questions and concerns answered. Peripheral IV and telemetry leads removed. All patient belongings given to patient. Rolling walker delivered to room. Patient transported to main entrance by by nurse and nurse tech via wheelchair.

## 2019-11-01 NOTE — TOC Transition Note (Signed)
Transition of Care [facility]) - CM/SW Discharge Note   Patient Details  Name: [name] MRN: [id number] Date of Birth: [birth year]  Transition of Care [facility]) CM/SW Contact:  Zenon Mayo, RN Phone Number: 11/01/2019, 12:39 PM   Clinical Narrative:    Patient is for dc today, she will need a rolling walker, NCM made referral to [facility] with [employer], the walker will be brought up to patient's room prior to dc.    Final next level of care: Home/Self Care Barriers to Discharge: No Barriers Identified   Patient Goals and CMS Choice Patient states their goals for this hospitalization and ongoing recovery are:: get better   Choice offered to / list presented to : NA  Discharge Placement                       Discharge Plan and Services                  DME Agency: NA       HH Arranged: NA          Social Determinants of Health (SDOH) Interventions     Readmission Risk Interventions Readmission Risk Prevention Plan 12/30/2018  Transportation Screening Complete  HRI or [place] Complete  Palliative Care Screening Not Applicable  Medication Review (RN Care Manager) Complete  Some recent data might be hidden

## 2019-11-01 NOTE — Plan of Care (Signed)

## 2019-11-01 NOTE — Progress Notes (Signed)
  Date: 11/01/2019  Patient name: [name]  Medical record number: [id number]  Date of birth: [birth year]   I have seen and evaluated [name] and discussed their care with the Residency Team. Briefly, [name] is a 64 year old woman who had missed HD and came in with SOB, weakness, chest congestion and cough.  Further symptoms included orthopnea and lightheadedness.  She underwent HD yesterday and feels improved.    Vitals:   11/01/19 0903 11/01/19 1135  BP:  (!) 121/51  Pulse:  75  Resp:  16  Temp:  99.3 F (37.4 C)  SpO2: 96% 96%   General: Lying in bed flat, NAD Eyes: + nystagmus noted (she reports long h/o vertigo), EOMI, anicteric sclerae Neck: Due to habitus, unable to assess JVD HENT: MMM, NCAT CV: RR, NR, no murmur Pulm: CTAB, mild crackles at bases, no wheezing Abd: Obese, NT, ND, +BS MSK: Normal muscle tone, obese Skin: warm and dry, no rash Psych: Pleasant, normal mood.   Assessment and Plan: I have seen and evaluated the patient as outlined above. I agree with the formulated Assessment and Plan as detailed in the residents' note, with the following changes:   1. Missed HD, weakness - Underwent HD and SOB and weakness improved - PT/OT evaluation pending - Follow up with Nephrology outpatient - If she discharges, will resume normal HD sessions.   Other issues per Dr. Darci Current daily note.   Sid Falcon, MD 4/21/20211:24 PM

## 2019-11-01 NOTE — Care Management Obs Status (Signed)
[facility] NOTIFICATION   Patient Details  Name: [name] MRN: [id number] Date of Birth: 30-Aug-1955   Medicare Observation Status Notification Given:  Yes    Zenon Mayo, RN 11/01/2019, 12:19 PM

## 2019-11-01 NOTE — Evaluation (Signed)
Physical Therapy Evaluation Patient Details Name: [name] MRN: [id number] DOB: 31-Dec-1955 Today's Date: 11/01/2019   History of Present Illness  Patient is a 63 year old female with past medical history significant for type 2 diabetes mellitus, hypertension, congestive heart failure with preserved EF, ESRD on dialysis Tuesday, Thursday, Saturday, COPD who presented to ER on 11/04/2019 with increasing shortness of breath starting yesterday.  Patient reports missing HD session on Saturday due to family emergency.  Reports episode of weakness 4/20 where she had to go down on right knee, denies loss of consciousness.  Clinical Impression  Pt admitted with above diagnosis.  Pt currently with functional limitations due to the deficits listed below (see PT Problem List). Pt will benefit from skilled PT to increase their independence and safety with mobility to allow discharge to the venue listed below.  Pt moving at S to slight min/guard A. She ambulated in room without AD and no SOB. Pt reports that she has good days and bad at home and interested in having a RW for days when she feels poorly.  Recommend RW and home with family support.     Follow Up Recommendations No PT follow up    Equipment Recommendations  Rolling walker with 5" wheels    Recommendations for Other Services       Precautions / Restrictions Precautions Precautions: Fall Precaution Comments: 2 falls in last 6 months      Mobility  Bed Mobility Overal bed mobility: Modified Independent             General bed mobility comments: HOB elevated  Transfers Overall transfer level: Needs assistance   Transfers: Sit to/from Stand Sit to Stand: Supervision         General transfer comment: stood without A and no balance issues. S only for safety  Ambulation/Gait Ambulation/Gait assistance: Supervision;Min guard Gait Distance (Feet): 36 Feet(plus 12) Assistive device: None Gait Pattern/deviations: Wide base  of support Gait velocity: decreased   General Gait Details: pt ambulated without AD in room with wide BOS and lateral sway. Pt pleased she didn't have any SOB with gait. Pt sleepy and deferred further gait or ambulation into the hallway.  Stairs            Wheelchair Mobility    Modified Rankin (Stroke Patients Only)       Balance Overall balance assessment: History of Falls;Needs assistance   Sitting balance-Leahy Scale: Good       Standing balance-Leahy Scale: Fair                               Pertinent Vitals/Pain Pain Assessment: No/denies pain    Home Living Family/patient expects to be discharged to:: Private residence Living Arrangements: Children Available Help at Discharge: Family;Available PRN/intermittently Type of Home: House Home Access: Level entry     Home Layout: One level Home Equipment: None      Prior Function Level of Independence: Independent               Hand Dominance        Extremity/Trunk Assessment        Lower Extremity Assessment Lower Extremity Assessment: Overall WFL for tasks assessed;Generalized weakness       Communication   Communication: No difficulties  Cognition Arousal/Alertness: Awake/alert Behavior During Therapy: WFL for tasks assessed/performed Overall Cognitive Status: Within Functional Limits for tasks assessed  General Comments      Exercises     Assessment/Plan    PT Assessment Patient needs continued PT services  PT Problem List Decreased strength;Decreased activity tolerance;Decreased balance;Decreased mobility       PT Treatment Interventions DME instruction;Gait training;Functional mobility training;Therapeutic activities;Therapeutic exercise;Balance training    PT Goals (Current goals can be found in the Care Plan section)  Acute Rehab PT Goals Patient Stated Goal: get a walker to use for days she is feeling  weaker while at home PT Goal Formulation: With patient Time For Goal Achievement: 11/08/19 Potential to Achieve Goals: Good    Frequency Min 3X/week   Barriers to discharge        Co-evaluation               AM-PAC PT "6 Clicks" Mobility  Outcome Measure Help needed turning from your back to your side while in a flat bed without using bedrails?: None Help needed moving from lying on your back to sitting on the side of a flat bed without using bedrails?: None Help needed moving to and from a bed to a chair (including a wheelchair)?: None Help needed standing up from a chair using your arms (e.g., wheelchair or bedside chair)?: None Help needed to walk in hospital room?: A Little Help needed climbing 3-5 steps with a railing? : A Little 6 Click Score: 22    End of Session   Activity Tolerance: Patient tolerated treatment well Patient left: in bed;with call bell/phone within reach Nurse Communication: Mobility status PT Visit Diagnosis: Difficulty in walking, not elsewhere classified (R26.2)    Time: 2500-3704 PT Time Calculation (min) (ACUTE ONLY): 22 min   Charges:   PT Evaluation $PT Eval Low Complexity: 1 Low          Pricila Bridge L. Tamala Julian, [place] Pager [phone] 11/01/2019   Galen Manila 11/01/2019, 10:01 AM

## 2019-11-01 NOTE — Evaluation (Signed)
Occupational Therapy Evaluation Patient Details Name: [name] MRN: [id number] DOB: 04/18/1956 Today's Date: 11/01/2019    History of Present Illness Patient is a 64 year old female with past medical history significant for type 2 diabetes mellitus, hypertension, congestive heart failure with preserved EF, ESRD on dialysis Tuesday, Thursday, Saturday, COPD who presented to ER on 11/04/2019 with increasing shortness of breath starting yesterday.  Patient reports missing HD session on Saturday due to family emergency.  Reports episode of weakness 4/20 where she had to go down on right knee, denies loss of consciousness.   Clinical Impression   PTA pt living with children and grand children. At time of eval, she presents rather fatigued due to lack of sleep but able to complete BADL at mod I level. Pt does show signs of decreased activity tolerance with little knowledge of ECS. Educated pt on ECS strategies for IADLs to maintain independence, such as using a stool when cooking. Also educated on shower seat, and use of rollator for longer distances such as grocery shopping. Per chart review pt agreed to a rollator per PT request but refused for OT. Also educated pt on toilet riser for increased independence in transfers. Given current status, no post acute OT follow up is indicated. Will continue to follow acutely to progress ECS strategies.    Follow Up Recommendations  No OT follow up;Supervision - Intermittent    Equipment Recommendations  Tub/shower seat;Other (comment)(rollator but pt declines)    Recommendations for Other Services       Precautions / Restrictions Precautions Precautions: Fall Precaution Comments: 2 falls in last 6 months Restrictions Weight Bearing Restrictions: No      Mobility Bed Mobility Overal bed mobility: Modified Independent             General bed mobility comments: HOB elevated  Transfers Overall transfer level: Modified independent    Transfers: Sit to/from Stand Sit to Stand: Supervision         General transfer comment: stood without A and no balance issues. S only for safety    Balance Overall balance assessment: History of Falls;Mild deficits observed, not formally tested(fatigues in Bil LEs easily)   Sitting balance-Leahy Scale: Good       Standing balance-Leahy Scale: Fair                             ADL either performed or assessed with clinical judgement   ADL Overall ADL's : Modified independent                                       General ADL Comments: Pt demonstrates ability to complete BADL at mod I level. She is able to complete dressing, bathing, and toileting without external assist. Pt does have decreased activity tolerance and gets fatigued in the legs with increased standing tasks. Educated pt on ECS, home safety, specifically using a stool to cook with to maintain energy. Educated pt on rollator usage, pt politely declined.     Vision Patient Visual Report: No change from baseline       Perception     Praxis      Pertinent Vitals/Pain Pain Assessment: No/denies pain     Hand Dominance Right   Extremity/Trunk Assessment Upper Extremity Assessment Upper Extremity Assessment: Overall WFL for tasks assessed   Lower Extremity Assessment Lower Extremity Assessment: Defer  to PT evaluation       Communication Communication Communication: No difficulties   Cognition Arousal/Alertness: Awake/alert Behavior During Therapy: WFL for tasks assessed/performed Overall Cognitive Status: Within Functional Limits for tasks assessed                                     General Comments       Exercises     Shoulder Instructions      Home Living Family/patient expects to be discharged to:: Private residence Living Arrangements: Children Available Help at Discharge: Family;Available PRN/intermittently Type of Home: House Home Access:  Level entry     Home Layout: One level     Bathroom Shower/Tub: Teacher, early years/pre: Standard     Home Equipment: None   Additional Comments: does have a stool she could sit on for cooking      Prior Functioning/Environment Level of Independence: Independent        Comments: still getting out and doing grocery shopping, reports legs get weak with longer distances        OT Problem List: Decreased knowledge of use of DME or AE;Decreased activity tolerance;Cardiopulmonary status limiting activity      OT Treatment/Interventions: Self-care/ADL training;Therapeutic exercise;Patient/family education;Balance training;Energy conservation;Therapeutic activities;DME and/or AE instruction    OT Goals(Current goals can be found in the care plan section) Acute Rehab OT Goals Patient Stated Goal: get back to being independent OT Goal Formulation: With patient Time For Goal Achievement: 11/15/19 Potential to Achieve Goals: Good  OT Frequency: Min 2X/week   Barriers to D/C:            Co-evaluation              AM-PAC OT "6 Clicks" Daily Activity     Outcome Measure Help from another person eating meals?: None Help from another person taking care of personal grooming?: None Help from another person toileting, which includes using toliet, bedpan, or urinal?: None Help from another person bathing (including washing, rinsing, drying)?: None Help from another person to put on and taking off regular upper body clothing?: None Help from another person to put on and taking off regular lower body clothing?: None 6 Click Score: 24   End of Session Nurse Communication: Mobility status  Activity Tolerance: Patient limited by fatigue(difficulty staying awake in session) Patient left: in bed;with call bell/phone within reach  OT Visit Diagnosis: Other abnormalities of gait and mobility (R26.89);History of falling (Z91.81)                Time: 1020-1028 OT Time  Calculation (min): 8 min Charges:  OT General Charges $OT Visit: 1 Visit OT Evaluation $OT Eval Low Complexity: [place], MSOT, OTR/L [facility] Office Number: [phone] Pager: [phone]  Zenovia Jarred 11/01/2019, 12:08 PM

## 2019-11-02 DIAGNOSIS — N2581 Secondary hyperparathyroidism of renal origin: Secondary | ICD-10-CM | POA: Diagnosis not present

## 2019-11-02 DIAGNOSIS — E8770 Fluid overload, unspecified: Secondary | ICD-10-CM | POA: Diagnosis not present

## 2019-11-02 DIAGNOSIS — E1129 Type 2 diabetes mellitus with other diabetic kidney complication: Secondary | ICD-10-CM | POA: Diagnosis not present

## 2019-11-02 DIAGNOSIS — D631 Anemia in chronic kidney disease: Secondary | ICD-10-CM | POA: Diagnosis not present

## 2019-11-02 DIAGNOSIS — N186 End stage renal disease: Secondary | ICD-10-CM | POA: Diagnosis not present

## 2019-11-02 DIAGNOSIS — D509 Iron deficiency anemia, unspecified: Secondary | ICD-10-CM | POA: Diagnosis not present

## 2019-11-02 DIAGNOSIS — Z992 Dependence on renal dialysis: Secondary | ICD-10-CM | POA: Diagnosis not present

## 2019-11-02 IMAGING — DX DG CHEST 2V
2 series · 2 of 2 positions shown · non-contrast
Comparison: None.

CLINICAL DATA: Shortness of breath, weakness

EXAM:
CHEST  2 VIEW

[chest pa]
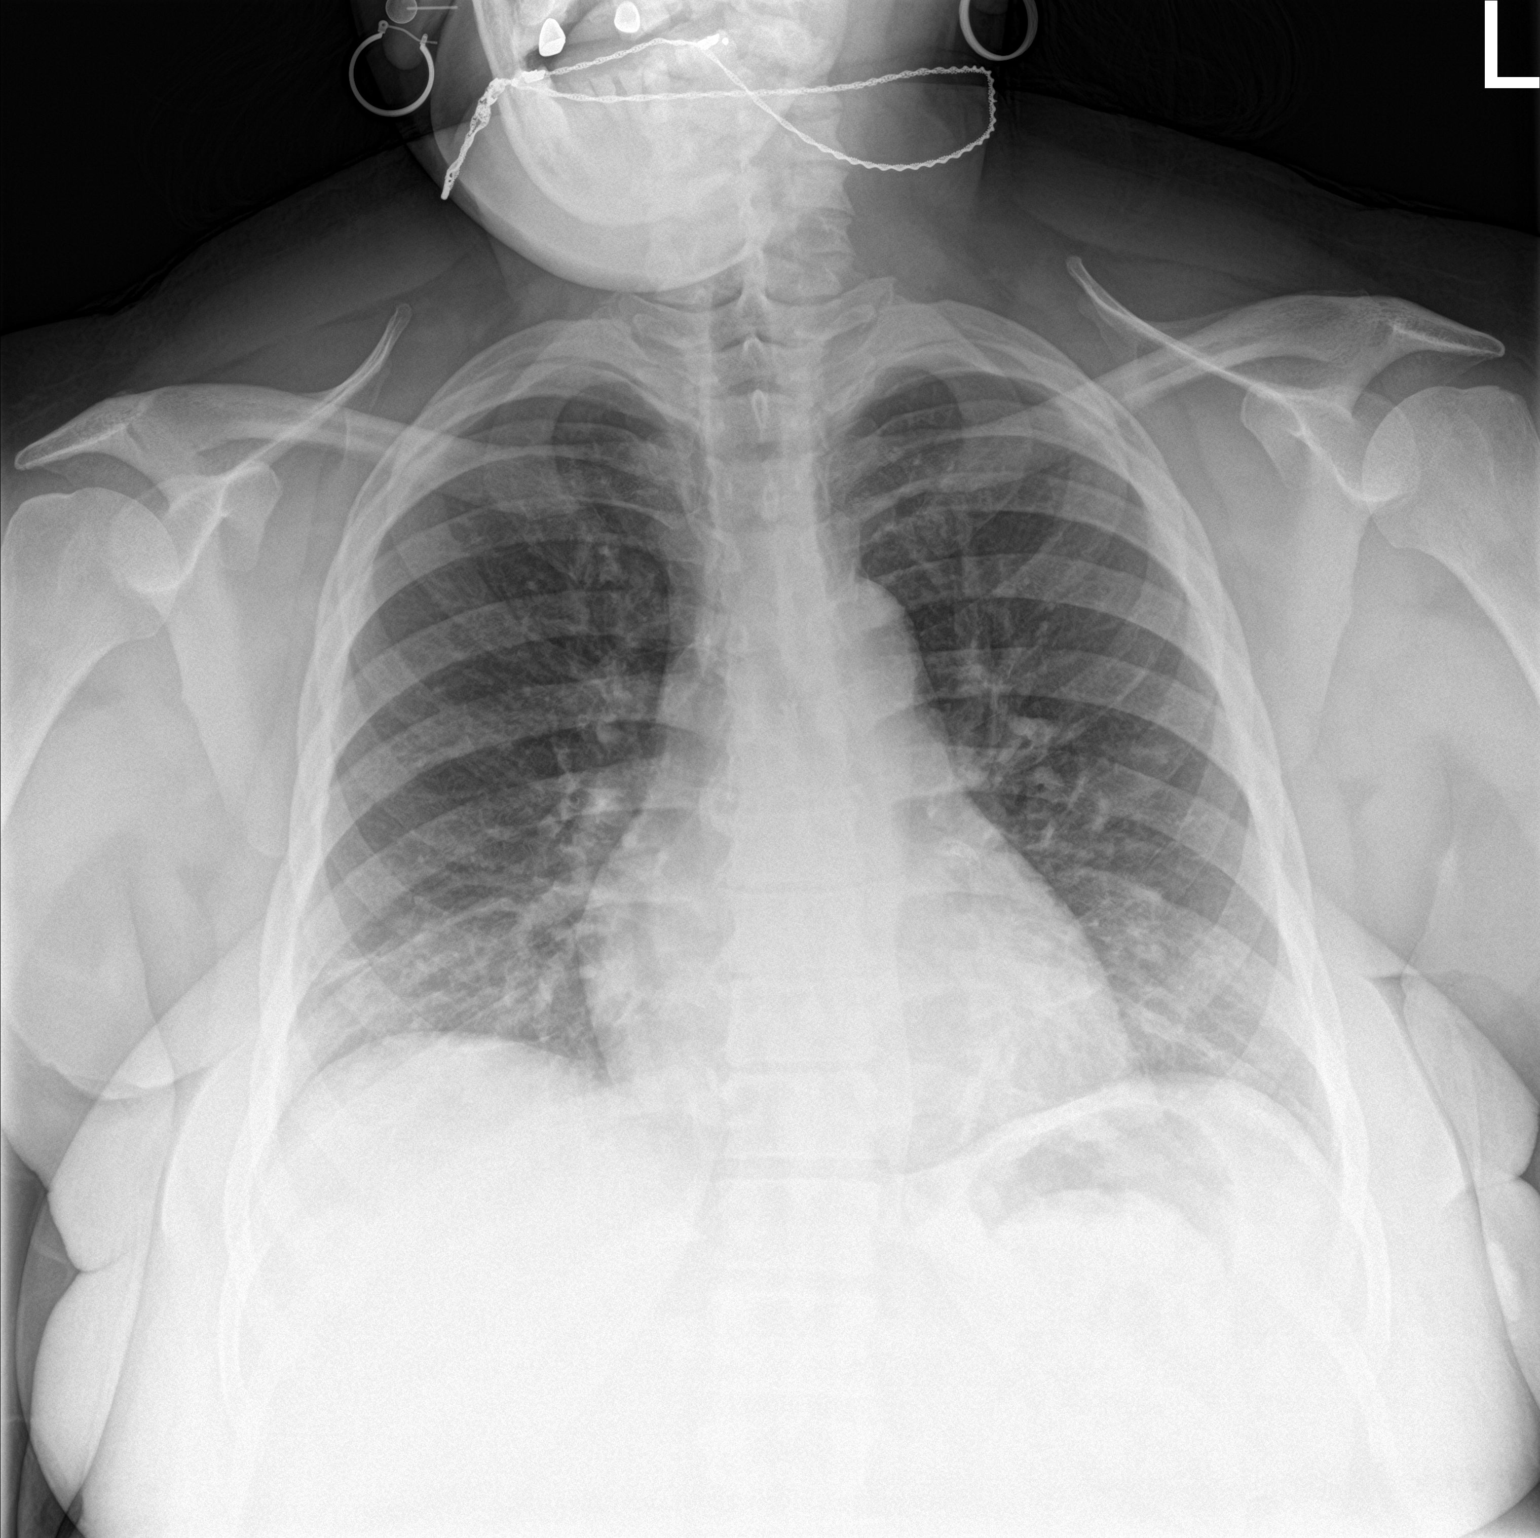

[chest lat]
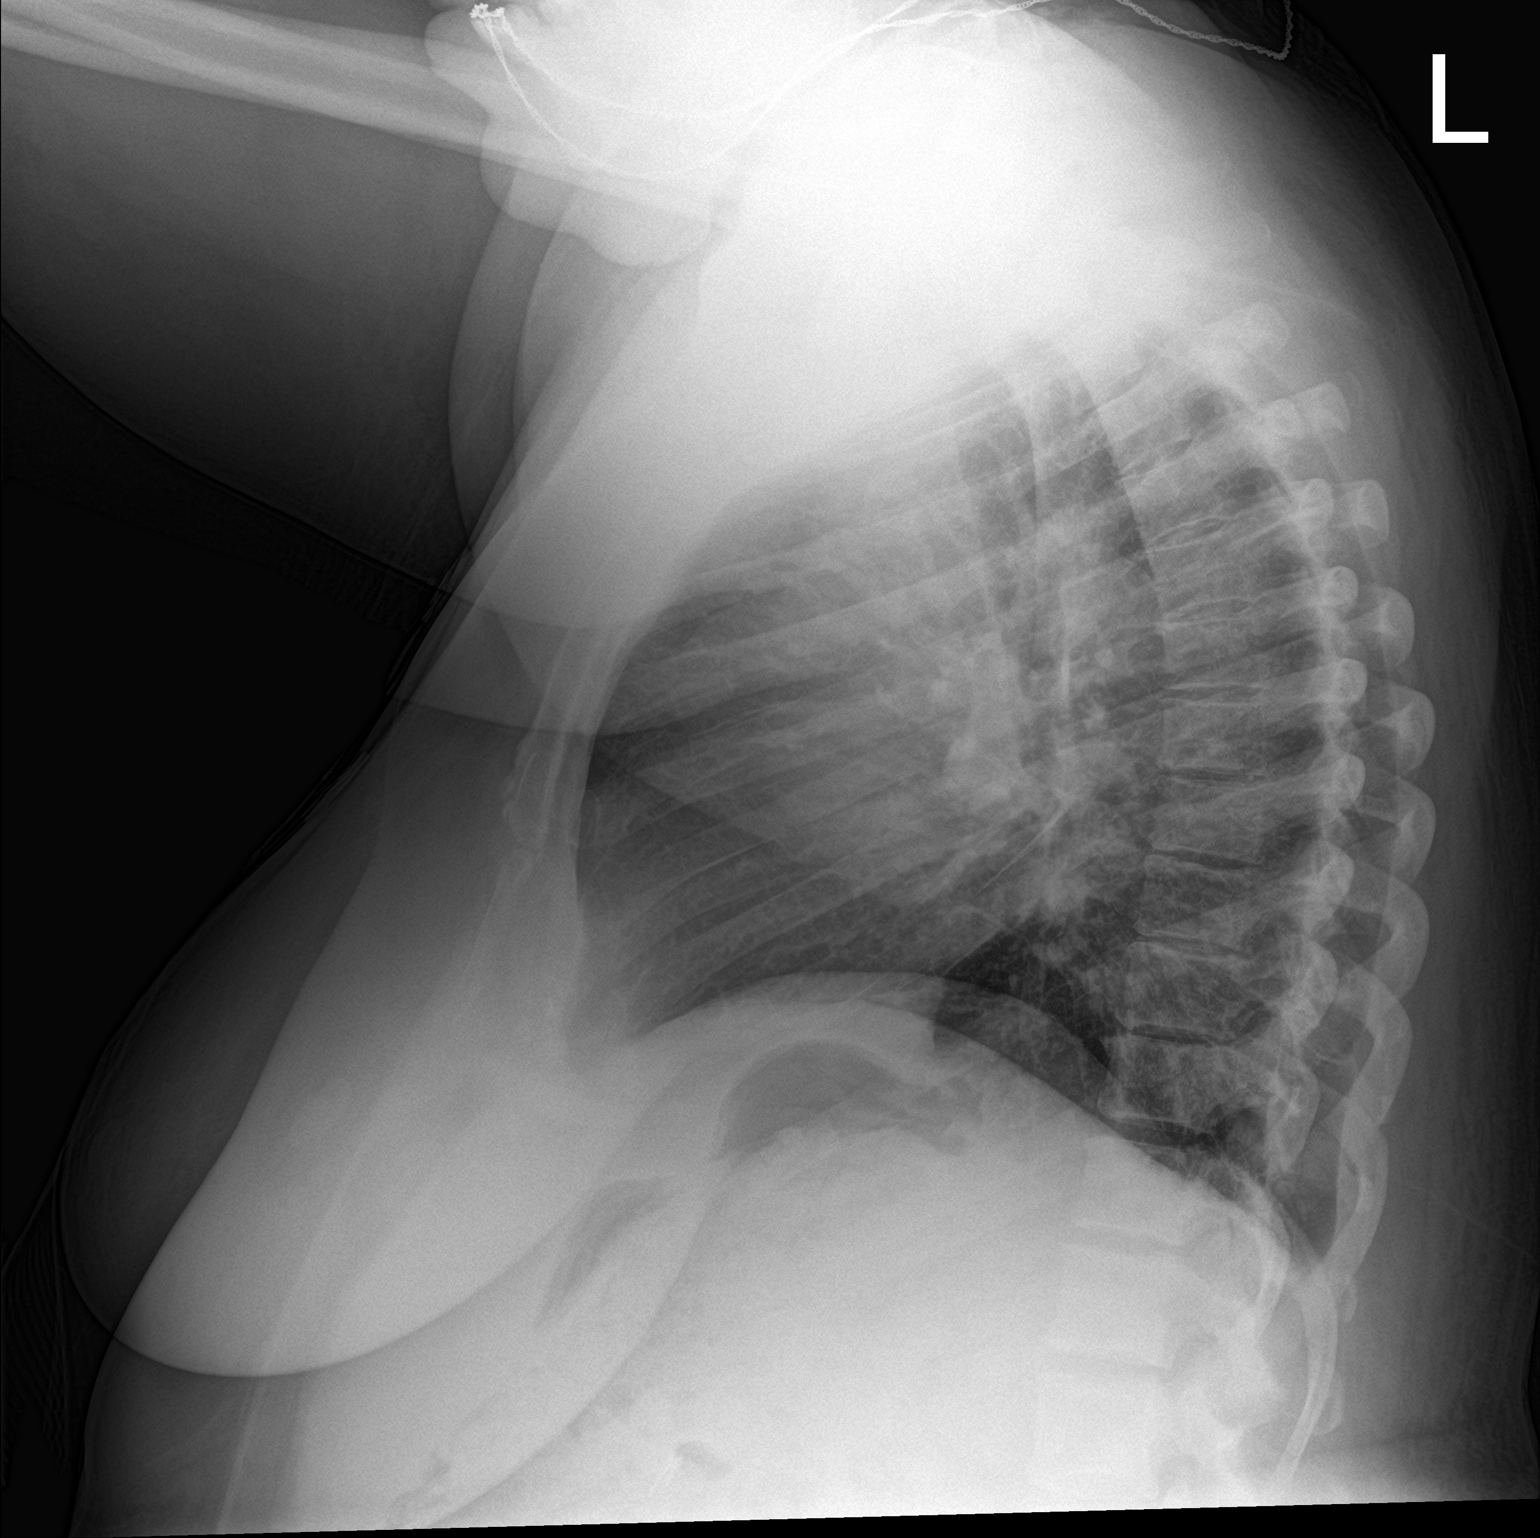

[2 of 2 positions shown; findings below may reference images not displayed]

FINDINGS: The heart size and mediastinal contours are within normal limits.
Both lungs are clear. The visualized skeletal structures are
unremarkable.
IMPRESSION: No active cardiopulmonary disease.

## 2019-11-04 DIAGNOSIS — N186 End stage renal disease: Secondary | ICD-10-CM | POA: Diagnosis not present

## 2019-11-04 DIAGNOSIS — N2581 Secondary hyperparathyroidism of renal origin: Secondary | ICD-10-CM | POA: Diagnosis not present

## 2019-11-04 DIAGNOSIS — Z992 Dependence on renal dialysis: Secondary | ICD-10-CM | POA: Diagnosis not present

## 2019-11-04 DIAGNOSIS — E1129 Type 2 diabetes mellitus with other diabetic kidney complication: Secondary | ICD-10-CM | POA: Diagnosis not present

## 2019-11-04 DIAGNOSIS — D509 Iron deficiency anemia, unspecified: Secondary | ICD-10-CM | POA: Diagnosis not present

## 2019-11-04 DIAGNOSIS — D631 Anemia in chronic kidney disease: Secondary | ICD-10-CM | POA: Diagnosis not present

## 2019-11-06 ENCOUNTER — Encounter: Payer: Self-pay | Admitting: Physician Assistant

## 2019-11-06 ENCOUNTER — Other Ambulatory Visit: Payer: Self-pay

## 2019-11-06 ENCOUNTER — Ambulatory Visit (INDEPENDENT_AMBULATORY_CARE_PROVIDER_SITE_OTHER): Payer: Medicare Other | Admitting: Physician Assistant

## 2019-11-06 VITALS — BP 125/58 | HR 85 | Temp 97.1°F | Wt 292.0 lb

## 2019-11-06 DIAGNOSIS — M62830 Muscle spasm of back: Secondary | ICD-10-CM

## 2019-11-06 DIAGNOSIS — L299 Pruritus, unspecified: Secondary | ICD-10-CM

## 2019-11-06 DIAGNOSIS — N186 End stage renal disease: Secondary | ICD-10-CM | POA: Diagnosis not present

## 2019-11-06 DIAGNOSIS — I739 Peripheral vascular disease, unspecified: Secondary | ICD-10-CM | POA: Diagnosis not present

## 2019-11-06 DIAGNOSIS — IMO0002 Reserved for concepts with insufficient information to code with codable children: Secondary | ICD-10-CM

## 2019-11-06 DIAGNOSIS — Z992 Dependence on renal dialysis: Secondary | ICD-10-CM | POA: Diagnosis not present

## 2019-11-06 DIAGNOSIS — H5500 Unspecified nystagmus: Secondary | ICD-10-CM

## 2019-11-06 DIAGNOSIS — J45909 Unspecified asthma, uncomplicated: Secondary | ICD-10-CM

## 2019-11-06 DIAGNOSIS — H814 Vertigo of central origin: Secondary | ICD-10-CM

## 2019-11-06 DIAGNOSIS — E118 Type 2 diabetes mellitus with unspecified complications: Secondary | ICD-10-CM

## 2019-11-06 DIAGNOSIS — E1142 Type 2 diabetes mellitus with diabetic polyneuropathy: Secondary | ICD-10-CM | POA: Diagnosis not present

## 2019-11-06 DIAGNOSIS — E1165 Type 2 diabetes mellitus with hyperglycemia: Secondary | ICD-10-CM

## 2019-11-06 MED ORDER — MOMETASONE FURO-FORMOTEROL FUM 200-5 MCG/ACT IN AERO: 2.0000 | INHALATION_SPRAY | Freq: Two times a day (BID) | RESPIRATORY_TRACT | 5 refills | Status: DC

## 2019-11-06 MED ORDER — ULTICARE SHORT PEN NEEDLES 31G X 8 MM MISC: 5 refills | Status: DC

## 2019-11-06 MED ORDER — TRAMADOL HCL 50 MG PO TABS: 50.0000 mg | ORAL_TABLET | Freq: Every day | ORAL | 0 refills | Status: DC | PRN

## 2019-11-06 MED ORDER — HYDROXYZINE HCL 25 MG PO TABS: 25.0000 mg | ORAL_TABLET | Freq: Four times a day (QID) | ORAL | 10 refills | Status: DC | PRN

## 2019-11-06 MED ORDER — OXYCODONE-ACETAMINOPHEN 5-325 MG PO TABS: 1.0000 | ORAL_TABLET | Freq: Three times a day (TID) | ORAL | 0 refills | Status: DC | PRN

## 2019-11-06 NOTE — Patient Instructions (Signed)
Diabetic Neuropathy Diabetic neuropathy refers to nerve damage that is caused by diabetes (diabetes mellitus). Over time, people with diabetes can develop nerve damage throughout the body. There are several types of diabetic neuropathy:  Peripheral neuropathy. This is the most common type of diabetic neuropathy. It causes damage to nerves that carry signals between the spinal cord and other parts of the body (peripheral nerves). This usually affects nerves in the feet and legs first, and may eventually affect the hands and arms. The damage affects the ability to sense touch or temperature.  Autonomic neuropathy. This type causes damage to nerves that control involuntary functions (autonomic nerves). These nerves carry signals that control: ? Heartbeat. ? Body temperature. ? Blood pressure. ? Urination. ? Digestion. ? Sweating. ? Sexual function. ? Response to changing blood sugar (glucose) levels.  Focal neuropathy. This type of nerve damage affects one area of the body, such as an arm, a leg, or the face. The injury may involve one nerve or a small group of nerves. Focal neuropathy can be painful and unpredictable, and occurs most often in older adults with diabetes. This often develops suddenly, but usually improves over time and does not cause long-term problems.  Proximal neuropathy. This type of nerve damage affects the nerves of the thighs, hips, buttocks, or legs. It causes severe pain, weakness, and muscle death (atrophy), usually in the thigh muscles. It is more common among older men and people who have type 2 diabetes. The length of recovery time may vary. What are the causes? Peripheral, autonomic, and focal neuropathies are caused by diabetes that is not well controlled with treatment. The cause of proximal neuropathy is not known, but it may be caused by inflammation related to uncontrolled blood glucose levels. What are the signs or symptoms? Peripheral neuropathy Peripheral  neuropathy develops slowly over time. When the nerves of the feet and legs no longer work, you may experience:  Burning, stabbing, or aching pain in the legs or feet.  Pain or cramping in the legs or feet.  Loss of feeling (numbness) and inability to feel pressure or pain in the feet. This can lead to: ? Thick calluses or sores on areas of constant pressure. ? Ulcers. ? Reduced ability to feel temperature changes.  Foot deformities.  Muscle weakness.  Loss of balance or coordination. Autonomic neuropathy The symptoms of autonomic neuropathy vary depending on which nerves are affected. Symptoms may include:  Problems with digestion, such as: ? Nausea or vomiting. ? Poor appetite. ? Bloating. ? Diarrhea or constipation. ? Trouble swallowing. ? Losing weight without trying to.  Problems with the heart, blood and lungs, such as: ? Dizziness, especially when standing up. ? Fainting. ? Shortness of breath. ? Irregular heartbeat.  Bladder problems, such as: ? Trouble starting or stopping urination. ? Leaking urine. ? Trouble emptying the bladder. ? Urinary tract infections (UTIs).  Problems with other body functions, such as: ? Sweat. You may sweat too much or too little. ? Temperature. You might get hot easily. Or, you might feel cold more than usual. ? Sexual function. Men may not be able to get or maintain an erection. Women may have vaginal dryness and difficulty with arousal. Focal neuropathy Symptoms affect only one area of the body. Common symptoms include:  Numbness.  Tingling.  Burning pain.  Prickling feeling.  Very sensitive skin.  Weakness.  Inability to move (paralysis).  Muscle twitching.  Muscles getting smaller (wasting).  Poor coordination.  Double or blurred vision. Proximal   neuropathy  Sudden, severe pain in the hip, thigh, or buttocks. Pain may spread from the back into the legs (sciatica).  Pain and numbness in the arms and  legs.  Tingling.  Loss of bladder control or bowel control.  Weakness and wasting of thigh muscles.  Difficulty getting up from a seated position.  Abdominal swelling.  Unexplained weight loss. How is this diagnosed? Diagnosis usually involves reviewing your medical history and any symptoms you have. Diagnosis varies depending on the type of neuropathy your health care provider suspects. Peripheral neuropathy Your health care provider will check areas that are affected by your nervous system (neurologic exam), such as your reflexes, how you move, and what you can feel. You may have other tests, such as:  Blood tests.  Removal and examination of fluid that surrounds the spinal cord (lumbar puncture).  CT scan.  MRI.  A test to check the nerves that control muscles (electromyogram, EMG).  Tests of how quickly messages pass through your nerves (nerve conduction velocity tests).  Removal of a small piece of nerve to be examined under a microscope (biopsy). Autonomic neuropathy You may have tests, such as:  Tests to measure your blood pressure and heart rate. This may include monitoring you while you are safely secured to an exam table that moves you from a lying position to an upright position (table tilt test).  Breathing tests to check your lungs.  Tests to check how food moves through the digestive system (gastric emptying tests).  Blood, sweat, or urine tests.  Ultrasound of your bladder.  Spinal fluid tests. Focal neuropathy This condition may be diagnosed with:  A neurologic exam.  CT scan.  MRI.  EMG.  Nerve conduction velocity tests. Proximal neuropathy There is no test to diagnose this type of neuropathy. You may have tests to rule out other possible causes of this type of neuropathy. Tests may include:  X-rays of your spine and lumbar region.  Lumbar puncture.  MRI. How is this treated? The goal of treatment is to keep nerve damage from getting  worse. The most important part of treatment is keeping your blood glucose level and your A1C level within your target range by following your diabetes management plan. Over time, maintaining lower blood glucose levels helps lessen symptoms. In some cases, you may need prescription pain medicine. Follow these instructions at home:  Lifestyle   Do not use any products that contain nicotine or tobacco, such as cigarettes and e-cigarettes. If you need help quitting, ask your health care provider.  Be physically active every day. Include strength training and balance exercises.  Follow a healthy meal plan.  Work with your health care provider to manage your blood pressure. General instructions  Follow your diabetes management plan as directed. ? Check your blood glucose levels as directed by your health care provider. ? Keep your blood glucose in your target range as directed by your health care provider. ? Have your A1C level checked at least two times a year, or as often as told by your health care provider.  Take over the counter and prescription medicines only as told by your health care provider. This includes insulin and diabetes medicine.  Do not drive or use heavy machinery while taking prescription pain medicines.  Check your skin and feet every day for cuts, bruises, redness, blisters, or sores.  Keep all follow up visits as told by your health care provider. This is important. Contact a health care provider if:  You have burning, stabbing, or aching pain in your legs or feet.  You are unable to feel pressure or pain in your feet.  You develop problems with digestion, such as: ? Nausea. ? Vomiting. ? Bloating. ? Constipation. ? Diarrhea. ? Abdominal pain.  You have difficulty with urination, such as inability: ? To control when you urinate (incontinence). ? To completely empty the bladder (retention).  You have palpitations.  You feel dizzy, weak, or faint when you  stand up. Get help right away if:  You cannot urinate.  You have sudden weakness or loss of coordination.  You have trouble speaking.  You have pain or pressure in your chest.  You have an irregular heart beat.  You have sudden inability to move a part of your body. Summary  Diabetic neuropathy refers to nerve damage that is caused by diabetes. It can affect nerves throughout the entire body, causing numbness and pain in the arms, legs, digestive tract, heart, and other body systems.  Keep your blood glucose level and your blood pressure in your target range, as directed by your health care provider. This can help prevent neuropathy from getting worse.  Check your skin and feet every day for cuts, bruises, redness, blisters, or sores.  Do not use any products that contain nicotine or tobacco, such as cigarettes and e-cigarettes. If you need help quitting, ask your health care provider. This information is not intended to replace advice given to you by your health care provider. Make sure you discuss any questions you have with your health care provider. Document Revised: 08/11/2017 Document Reviewed: 08/03/2016 Elsevier Patient Education  2020 [employer]  

## 2019-11-07 ENCOUNTER — Telehealth: Payer: Self-pay

## 2019-11-07 ENCOUNTER — Other Ambulatory Visit: Payer: Self-pay | Admitting: Physician Assistant

## 2019-11-07 DIAGNOSIS — N2581 Secondary hyperparathyroidism of renal origin: Secondary | ICD-10-CM | POA: Diagnosis not present

## 2019-11-07 DIAGNOSIS — E1129 Type 2 diabetes mellitus with other diabetic kidney complication: Secondary | ICD-10-CM | POA: Diagnosis not present

## 2019-11-07 DIAGNOSIS — D631 Anemia in chronic kidney disease: Secondary | ICD-10-CM | POA: Diagnosis not present

## 2019-11-07 DIAGNOSIS — N186 End stage renal disease: Secondary | ICD-10-CM | POA: Diagnosis not present

## 2019-11-07 DIAGNOSIS — D509 Iron deficiency anemia, unspecified: Secondary | ICD-10-CM | POA: Diagnosis not present

## 2019-11-07 DIAGNOSIS — Z992 Dependence on renal dialysis: Secondary | ICD-10-CM | POA: Diagnosis not present

## 2019-11-07 DIAGNOSIS — J45909 Unspecified asthma, uncomplicated: Secondary | ICD-10-CM

## 2019-11-07 LAB — RENAL FUNCTION PANEL
Albumin: 4.1 g/dL (ref 3.8–4.8)
BUN/Creatinine Ratio: 6 — ABNORMAL LOW (ref 12–28)
BUN: 54 mg/dL — ABNORMAL HIGH (ref 8–27)
CO2: 19 mmol/L — ABNORMAL LOW (ref 20–29)
Calcium: 8.3 mg/dL — ABNORMAL LOW (ref 8.7–10.3)
Chloride: 97 mmol/L (ref 96–106)
Creatinine, Ser: 8.88 mg/dL — ABNORMAL HIGH (ref 0.57–1.00)
GFR calc Af Amer: 5 mL/min/{1.73_m2} — ABNORMAL LOW (ref >59)
GFR calc non Af Amer: 4 mL/min/{1.73_m2} — ABNORMAL LOW (ref >59)
Glucose: 258 mg/dL — ABNORMAL HIGH (ref 65–99)
Phosphorus: 7 mg/dL — ABNORMAL HIGH (ref 3.0–4.3)
Potassium: 4.6 mmol/L (ref 3.5–5.2)
Sodium: 138 mmol/L (ref 134–144)

## 2019-11-07 MED ORDER — FLUTICASONE FUROATE-VILANTEROL 200-25 MCG/INH IN AEPB: 1.0000 | INHALATION_SPRAY | Freq: Every day | RESPIRATORY_TRACT | 11 refills | Status: DC

## 2019-11-07 MED ORDER — DOXEPIN HCL 10 MG PO CAPS: 10.0000 mg | ORAL_CAPSULE | Freq: Four times a day (QID) | ORAL | 5 refills | Status: DC | PRN

## 2019-11-07 NOTE — Telephone Encounter (Signed)
Dulera changed to [employer]

## 2019-11-07 NOTE — Telephone Encounter (Signed)
Patient would like to try the Doxepin.

## 2019-11-07 NOTE — Telephone Encounter (Signed)
-----   Message from [name], [place] sent at 11/07/2019 11:23 AM EDT ----- Renal function declined compared to the hospital labs. However, this is consistent with being the day before a dialysis day and being over the longest period between dialysis (having them drawn the Monday before a T-Th-Sa dialysis days.

## 2019-11-07 NOTE — Telephone Encounter (Signed)
Patient advised as directed below. 

## 2019-11-07 NOTE — Telephone Encounter (Signed)
Approved per protocol.  

## 2019-11-07 NOTE — Telephone Encounter (Signed)
Received fax from the pharmacy that the [facility] 200-5MCG oral inhaler 120INH-Drug is not covered by patient plan. The preferred alternative is BREOELLIPTAINH,ADVAIRHFAAERPlan. Is not requesting a PA. Please review.

## 2019-11-08 ENCOUNTER — Other Ambulatory Visit (INDEPENDENT_AMBULATORY_CARE_PROVIDER_SITE_OTHER): Payer: Self-pay | Admitting: Vascular Surgery

## 2019-11-08 DIAGNOSIS — I739 Peripheral vascular disease, unspecified: Secondary | ICD-10-CM

## 2019-11-09 ENCOUNTER — Other Ambulatory Visit: Payer: Self-pay | Admitting: Physician Assistant

## 2019-11-09 ENCOUNTER — Ambulatory Visit (INDEPENDENT_AMBULATORY_CARE_PROVIDER_SITE_OTHER): Payer: Medicare Other | Admitting: Nurse Practitioner

## 2019-11-09 ENCOUNTER — Encounter (INDEPENDENT_AMBULATORY_CARE_PROVIDER_SITE_OTHER): Payer: Medicare Other

## 2019-11-09 DIAGNOSIS — Z992 Dependence on renal dialysis: Secondary | ICD-10-CM | POA: Diagnosis not present

## 2019-11-09 DIAGNOSIS — D631 Anemia in chronic kidney disease: Secondary | ICD-10-CM | POA: Diagnosis not present

## 2019-11-09 DIAGNOSIS — E1129 Type 2 diabetes mellitus with other diabetic kidney complication: Secondary | ICD-10-CM | POA: Diagnosis not present

## 2019-11-09 DIAGNOSIS — N2581 Secondary hyperparathyroidism of renal origin: Secondary | ICD-10-CM | POA: Diagnosis not present

## 2019-11-09 DIAGNOSIS — N186 End stage renal disease: Secondary | ICD-10-CM | POA: Diagnosis not present

## 2019-11-09 DIAGNOSIS — D509 Iron deficiency anemia, unspecified: Secondary | ICD-10-CM | POA: Diagnosis not present

## 2019-11-09 NOTE — Telephone Encounter (Signed)
Requested Prescriptions  Pending Prescriptions Disp Refills  . carvedilol (COREG) 25 MG tablet [Pharmacy Med Name: CARVEDILOL 25 MG TABLET] 60 tablet 2    Sig: TAKE (1) TABLET BY MOUTH TWICE A DAY WITH MEALS (BREAKFAST AND SUPPER)     Cardiovascular:  Beta Blockers Passed - 11/09/2019  1:00 PM      Passed - Last BP in normal range    BP Readings from Last 1 Encounters:  11/06/19 (!) 125/58         Passed - Last Heart Rate in normal range    Pulse Readings from Last 1 Encounters:  11/06/19 85         Passed - Valid encounter within last 6 months    Recent Outpatient Visits          3 days ago Diabetes mellitus type 2, uncontrolled, with complications [facility])   [place], [place]   3 months ago Chest pain, unspecified type   [place], [place]   5 months ago Acute right-sided low back pain without sciatica   [place], [place]   8 months ago Chronic hepatitis C without hepatic coma [facility])   [place], Clearnce Sorrel, PA-C   1 year ago ESRD on dialysis [facility])   [place], Clearnce Sorrel, PA-C      Future Appointments            In 1 week [place], [place], [place], [facility]   In 2 months [place], Clearnce Sorrel, PA-C [employer], [place]           . atorvastatin (LIPITOR) 80 MG tablet [Pharmacy Med Name: ATORVASTATIN 80 MG TABLET] 30 tablet 2    Sig: TAKE 1 TABLET BY MOUTH ONCE DAILY AT 6PM     Cardiovascular:  Antilipid - Statins Failed - 11/09/2019  1:00 PM      Failed - Total Cholesterol in normal range and within 360 days    Cholesterol  Date Value Ref Range Status  09/13/2016 122 0 - 200 mg/dL Final         Failed - LDL in normal range and within 360 days    LDL Cholesterol  Date Value Ref Range Status  09/13/2016 52 0 - 99 mg/dL Final    Comment:           Total  Cholesterol/HDL:CHD Risk Coronary Heart Disease Risk Table                     Men   Women  1/2 Average Risk   3.4   3.3  Average Risk       5.0   4.4  2 X Average Risk   9.6   7.1  3 X Average Risk  23.4   11.0        Use the calculated Patient Ratio above and the CHD Risk Table to determine the patient's CHD Risk.        ATP III CLASSIFICATION (LDL):  <100     mg/dL   Optimal  100-129  mg/dL   Near or Above                    Optimal  130-159  mg/dL   Borderline  160-189  mg/dL   High  >190     mg/dL   Very High  Failed - HDL in normal range and within 360 days    HDL  Date Value Ref Range Status  09/13/2016 36 (L) >40 mg/dL Final         Failed - Triglycerides in normal range and within 360 days    Triglycerides  Date Value Ref Range Status  09/13/2016 168 (H) <150 mg/dL Final         Passed - Patient is not pregnant      Passed - Valid encounter within last 12 months    Recent Outpatient Visits          3 days ago Diabetes mellitus type 2, uncontrolled, with complications [facility])   [place], [place], PA-C   3 months ago Chest pain, unspecified type   [place], [place]   5 months ago Acute right-sided low back pain without sciatica   [place], [place]   8 months ago Chronic hepatitis C without hepatic coma [facility])   [place], Clearnce Sorrel, PA-C   1 year ago ESRD on dialysis [facility])   [place], Clearnce Sorrel, PA-C      Future Appointments            In 1 week [place], [place], [place], [facility]   In 2 months [place], Clearnce Sorrel, PA-C [employer], [place]

## 2019-11-11 DIAGNOSIS — E1122 Type 2 diabetes mellitus with diabetic chronic kidney disease: Secondary | ICD-10-CM | POA: Diagnosis not present

## 2019-11-11 DIAGNOSIS — N186 End stage renal disease: Secondary | ICD-10-CM | POA: Diagnosis not present

## 2019-11-11 DIAGNOSIS — Z992 Dependence on renal dialysis: Secondary | ICD-10-CM | POA: Diagnosis not present

## 2019-11-11 DIAGNOSIS — N2581 Secondary hyperparathyroidism of renal origin: Secondary | ICD-10-CM | POA: Diagnosis not present

## 2019-11-14 DIAGNOSIS — N186 End stage renal disease: Secondary | ICD-10-CM | POA: Diagnosis not present

## 2019-11-14 DIAGNOSIS — N2581 Secondary hyperparathyroidism of renal origin: Secondary | ICD-10-CM | POA: Diagnosis not present

## 2019-11-14 DIAGNOSIS — Z992 Dependence on renal dialysis: Secondary | ICD-10-CM | POA: Diagnosis not present

## 2019-11-16 ENCOUNTER — Ambulatory Visit: Payer: Medicare Other | Admitting: Physician Assistant

## 2019-11-16 DIAGNOSIS — Z992 Dependence on renal dialysis: Secondary | ICD-10-CM | POA: Diagnosis not present

## 2019-11-16 DIAGNOSIS — N186 End stage renal disease: Secondary | ICD-10-CM | POA: Diagnosis not present

## 2019-11-16 DIAGNOSIS — N2581 Secondary hyperparathyroidism of renal origin: Secondary | ICD-10-CM | POA: Diagnosis not present

## 2019-11-16 NOTE — Progress Notes (Deleted)
Cardiology Office Note    Date:  11/16/2019   ID:  [name], DOB Apr 11, 1956, MRN [id number]  PCP:  [name], PA-C  Cardiologist:  ***  Electrophysiologist: ***  Chief Complaint: Hospital follow up s/p    /  Months follow up  History of Present Illness:   [name] is a 64 y.o. female with hx of CAD, mild AS, chronic diastolic CHF, ESRD (T Th Sat), HTN, HLD and  DM seen for hospital  follow up.   Hx of NSTEMI 09/2016. Echocardiogram showed ejection fraction 45-50% with septal and apical hypokinesis. Cath with  95% proximal LAD and she had PCI with a Synergy drug-eluting stent. Echocardiogram August 2018 showed normal LV function (improved), grade 1 diastolic dysfunction, mild aortic stenosis with mean gradient 13 mmHg, mild aortic insufficiency and mild left atrial enlargement. She had repeat cardiac catheterization October 2018 because of recurring chest pain. She was found to have a widely patent mid LAD stent. There was mild to moderate disease in her circumflex, obtuse marginal and diagonal. Left ventricular filling pressure was normal. Recommended medical therapy.  Prior admission for CHF exacerbation 2nd to non compliance with diuretics.   Last echo 07/2018 showed LVEF of 60-65%, moderate LVH, grade 1 DD, mild to moderate AS.   She doing well when seen by me September 2020.  Most recently admitted 11/01/2019 for volume overload in setting of missed dialysis.  Symptoms improved with Lasix.  Per note noted bidirectional nystagmus by exam following dialysis>> discharging team recommended outpatient work-> brain MRI schedule on May 14th 2021.  Past Medical History:  Diagnosis Date  . Anemia   . Aortic stenosis    Echo 8/18: mean 13, peak 28, LVOT/AV mean velocity 0.51  . Arthritis   . Asthma    As a child   . Bronchitis   . CAD (coronary artery disease)    a. 09/2016: 50% Ost 1st Mrg stenosis, 50% 2nd Mrg stenosis, 20% Mid-Cx, 95% Prox LAD, 40% mid-LAD, and  10% dist-LAD stenosis. Staged PCI with DES to Prox-LAD.   [name] Chronic combined systolic and diastolic CHF (congestive heart failure) ([place]) 2011   echo 2/18: EF 55-60, normal wall motion, grade 2 diastolic dysfunction, trivial AI // echo 3/18: Septal and apical HK, EF 45-50, normal wall motion, trivial AI, mild LAE, PASP 38 // echo 8/18: EF 60-65, normal wall motion, grade 1 diastolic dysfunction, calcified aortic valve leaflets, mild aortic stenosis (mean 13, peak 28, LVOT/AV mean velocity 0.51), mild AI, moderate MAC, mild LAE, trivial TR   . Chronic kidney disease    STAGE 4  . Chronic kidney disease on chronic dialysis (HCC)    t, th, sat  . Complication of anesthesia   . Depression   . Diabetes mellitus Dx 1989  . Elevated lipids   . GERD (gastroesophageal reflux disease)   . Gout   . Heart murmur    asymptomatic  . Hepatitis C Dx 2013  . Hypertension Dx 1989  . Infected surgical wound    Lt arm  . Myocardial infarction ([place]) 07/2015  . Obesity   . Pancreatitis 2013  . Pneumonia   . Refusal of blood transfusions as patient is Jehovah's Witness   . Tendinitis   . Tremors of nervous system    LEFT HAND  . Ulcer 2010    Past Surgical History:  Procedure Laterality Date  . A/V FISTULAGRAM Left 04/11/2019   Procedure: A/V FISTULAGRAM;  Surgeon: Delana Meyer,  Dolores Lory, MD;  Location: [place] [facility];  Service: Cardiovascular;  Laterality: Left;  . A/V FISTULAGRAM Left 06/02/2019   Procedure: A/V FISTULAGRAM;  Surgeon: Katha Cabal, MD;  Location: [place] [facility];  Service: Cardiovascular;  Laterality: Left;  . APPLICATION OF WOUND VAC Left 08/14/2017   Procedure: APPLICATION OF WOUND VAC Exchange;  Surgeon: Robert Bellow, MD;  Location: [facility];  Service: General;  Laterality: Left;  . APPLICATION OF WOUND VAC Left 12/21/2018   Procedure: APPLICATION OF WOUND VAC;  Surgeon: Katha Cabal, MD;  Location: [facility];  Service: Vascular;  Laterality: Left;  . AV  FISTULA PLACEMENT Left 08/19/2018   Procedure: ARTERIOVENOUS (AV) FISTULA CREATION ( BRACHIOBASILIC );  Surgeon: Katha Cabal, MD;  Location: [facility];  Service: Vascular;  Laterality: Left;  . BASCILIC VEIN TRANSPOSITION Left 11/18/2018   Procedure: BASCILIC VEIN TRANSPOSITION;  Surgeon: Katha Cabal, MD;  Location: [facility];  Service: Vascular;  Laterality: Left;  . CHOLECYSTECTOMY    . COLONOSCOPY WITH PROPOFOL N/A 02/03/2018   Procedure: COLONOSCOPY WITH PROPOFOL;  Surgeon: Lin Landsman, MD;  Location[facility];  Service: Gastroenterology;  Laterality: N/A;  . CORONARY ANGIOPLASTY  07/2015   STENT  . CORONARY STENT INTERVENTION N/A 09/18/2016   Procedure: Coronary Stent Intervention;  Surgeon: Troy Sine, MD;  Location: [place] [facility];  Service: Cardiovascular;  Laterality: N/A;  . DIALYSIS/PERMA CATHETER INSERTION N/A 05/10/2018   Procedure: DIALYSIS/PERMA CATHETER INSERTION;  Surgeon: Katha Cabal, MD;  Location: [place] [facility];  Service: Cardiovascular;  Laterality: N/A;  . DRESSING CHANGE UNDER ANESTHESIA Left 08/15/2017   Procedure: exploration of wound for bleeding;  Surgeon: Robert Bellow, MD;  Location: [facility];  Service: General;  Laterality: Left;  . ESOPHAGOGASTRODUODENOSCOPY (EGD) WITH PROPOFOL N/A 02/03/2018   Procedure: ESOPHAGOGASTRODUODENOSCOPY (EGD) WITH PROPOFOL;  Surgeon: Lin Landsman, MD;  Location: [facility];  Service: Gastroenterology;  Laterality: N/A;  . EYE SURGERY  11/17/2018  . INCISION AND DRAINAGE ABSCESS Left 08/12/2017   Procedure: INCISION AND DRAINAGE ABSCESS;  Surgeon: Robert Bellow, MD;  Location: [facility];  Service: General;  Laterality: Left;  . KNEE ARTHROSCOPY    . LEFT HEART CATH N/A 09/18/2016   Procedure: Left Heart Cath;  Surgeon: Troy Sine, MD;  Location: [place] [facility];  Service: Cardiovascular;  Laterality: N/A;  . LEFT HEART CATH AND CORONARY ANGIOGRAPHY N/A 09/16/2016   Procedure:  Left Heart Cath and Coronary Angiography;  Surgeon: Burnell Blanks, MD;  Location: [place] [facility];  Service: Cardiovascular;  Laterality: N/A;  . LEFT HEART CATH AND CORONARY ANGIOGRAPHY N/A 04/29/2017   Procedure: LEFT HEART CATH AND CORONARY ANGIOGRAPHY;  Surgeon: Nelva Bush, MD;  Location: [place] [facility];  Service: Cardiovascular;  Laterality: N/A;  . LOWER EXTREMITY ANGIOGRAPHY Right 03/08/2018   Procedure: LOWER EXTREMITY ANGIOGRAPHY;  Surgeon: Katha Cabal, MD;  Location: [place] [facility];  Service: Cardiovascular;  Laterality: Right;  . TUBAL LIGATION    . TUBAL LIGATION    . UPPER EXTREMITY ANGIOGRAPHY Right 09/19/2019   Procedure: UPPER EXTREMITY ANGIOGRAPHY;  Surgeon: Katha Cabal, MD;  Location: [place] [facility];  Service: Cardiovascular;  Laterality: Right;  . WOUND DEBRIDEMENT Left 12/21/2018   Procedure: DEBRIDEMENT WOUND;  Surgeon: Katha Cabal, MD;  Location: [facility];  Service: Vascular;  Laterality: Left;  . WOUND DEBRIDEMENT Left 12/30/2018   Procedure: DEBRIDEMENT WOUND WITH VAC  PLACEMENT (LEFT UPPER EXTREMITY);  Surgeon: Katha Cabal, MD;  Location: [facility];  Service: Vascular;  Laterality: Left;    Current Medications: Prior to Admission medications   Medication Sig Start Date End Date Taking? Authorizing Provider  Accu-Chek FastClix Lancets MISC USE AS DIRECTED TO CHECK BLOOD SUGAR THREE TIMES DAILY AND AT BEDTIME 09/28/18   [name], PA-C  albuterol (VENTOLIN HFA) 108 (90 Base) MCG/ACT inhaler Inhale 2 puffs into the lungs every 4 (four) hours as needed for wheezing or shortness of breath. 10/21/19 11/20/19  Janeece Fitting, PA-C  allopurinol (ZYLOPRIM) 100 MG tablet TAKE 1 TABLET BY MOUTH ONCE DAILY. Patient taking differently: Take 100 mg by mouth daily.  06/16/19   [name], PA-C  aspirin EC 81 MG EC tablet Take 1 tablet (81 mg total) by mouth daily. 09/19/16   Strader, Fransisco Hertz, PA-C  atorvastatin  (LIPITOR) 80 MG tablet TAKE 1 TABLET BY MOUTH ONCE DAILY AT 6PM 11/09/19   [name], PA-C  AURYXIA 1 GM 210 MG(Fe) tablet Take 420 mg by mouth 3 (three) times daily with meals.  10/10/18   [provider]  b complex-vitamin c-folic acid (NEPHRO-VITE) 0.8 MG TABS tablet Take 1 tablet by mouth daily.    [provider]  carvedilol (COREG) 25 MG tablet TAKE (1) TABLET BY MOUTH TWICE A DAY WITH MEALS (BREAKFAST AND SUPPER) 11/09/19   [name], PA-C  Continuous Blood Gluc Receiver (FREESTYLE LIBRE 14 DAY READER) DEVI 1 each by Does not apply route 4 (four) times daily -  before meals and at bedtime. 08/10/19   [name], PA-C  Continuous Blood Gluc Sensor (FREESTYLE LIBRE 14 DAY SENSOR) MISC 1 each by Does not apply route every 14 (fourteen) days. Check blood sugar ACHS 08/10/19   [name], PA-C  cyclobenzaprine (FLEXERIL) 5 MG tablet Take 1 tablet (5 mg total) by mouth 3 (three) times daily as needed for muscle spasms. 08/10/19   [name], PA-C  dicyclomine (BENTYL) 20 MG tablet TAKE 1 TABLET(20 MG) BY MOUTH FOUR TIMES DAILY BEFORE MEALS AND AT BEDTIME 10/16/19   [name], PA-C  docusate sodium (COLACE) 100 MG capsule Take 1 capsule (100 mg total) by mouth 2 (two) times daily as needed for mild constipation. 09/20/17   Gouru, Illene Silver, MD  doxepin (SINEQUAN) 10 MG capsule Take 1 capsule (10 mg total) by mouth 4 (four) times daily as needed. For itching 11/07/19   [name], PA-C  fluticasone [facility]) 50 MCG/ACT nasal spray Place 2 sprays into both nostrils daily as needed for allergies or rhinitis. 09/28/17   [name], PA-C  fluticasone furoate-vilanterol (BREO ELLIPTA) 200-25 MCG/INH AEPB Inhale 1 puff into the lungs daily. 11/07/19   [name], PA-C  gentamicin cream (GARAMYCIN) 0.1 % Apply 1 application topically 2 (two) times daily. Patient taking differently: Apply 1 application topically daily  as needed (itching).  08/21/19   Edrick Kins, DPM  hydrALAZINE (APRESOLINE) 100 MG tablet TAKE (1) TABLET BY MOUTH THREE TIMES DAILY Patient taking differently: Take 100 mg by mouth 3 (three) times daily.  08/14/19   [name], PA-C  HYDROcodone-acetaminophen (NORCO/VICODIN) 5-325 MG tablet Take 1 tablet by mouth every 6 (six) hours as needed for moderate pain.    [provider]  Insulin Pen Needle (ULTICARE SHORT PEN NEEDLES) 31G X 8 MM MISC USE THREE TIMES DAILY WITH INSULIN 11/06/19   [name],  PA-C  LEVEMIR FLEXTOUCH 100 UNIT/ML FlexPen INJECT 60 UNITS UNDER THE SKIN TWICE DAILY 11/07/19   [name], PA-C  lidocaine-prilocaine (EMLA) cream Apply 1 application topically as needed. 03/27/19   Schnier, Dolores Lory, MD  liraglutide (VICTOZA) 18 MG/3ML SOPN Start with 0.78m daily and increase by 0.633mper week until max dose of 1.8 mg dose achieved. Patient taking differently: Inject 0.6-1.8 mg into the skin See admin instructions. Start with 0.44m3maily and increase by 0.8mg38mr week until max dose of 1.2mg 78me achieved. 09/08/19   BurneMar DaringC  loratadine (CLARITIN) 10 MG tablet TAKE 1 TABLET BY MOUTH ONCE DAILY. Patient taking differently: Take 10 mg by mouth at bedtime.  08/11/19   BurneMar DaringC  mometasone (ELOCON) 0.1 % cream APPLY AS DIRECTED TO AFFECTED AREA ONCE DAILY. Patient taking differently: Apply 1 application topically daily as needed.  12/08/18   BurneMar DaringC  montelukast (SINGULAIR) 10 MG tablet TAKE ONE TABLET BY MOUTH AT BEDTIME. Patient taking differently: Take 10 mg by mouth at bedtime.  08/11/19   BurneMar DaringC  nitroGLYCERIN (NITROSTAT) 0.4 MG SL tablet Place 1 tablet (0.4 mg total) under the tongue every 5 (five) minutes x 3 doses as needed for chest pain. 02/03/19   Alen Matheson, PA  NOVOLOG FLEXPEN 100 UNIT/ML FlexPen USE 25 UNITS UNDER THE SKIN THREE TIMES DAILY WITH  MEALS Patient taking differently: Inject 25 Units into the skin 3 (three) times daily with meals.  07/18/19   BurneMar DaringC  nystatin cream (MYCOSTATIN) Apply 1 application topically 2 (two) times daily. Patient taking differently: Apply 1 application topically daily as needed for dry skin.  08/10/19   BurneMar DaringC  omeprazole (PRILOSEC) 20 MG capsule TAKE (1) CAPSULE BY MOUTH ONCE DAILY. Patient taking differently: Take 20 mg by mouth every evening.  08/11/19   BurneMar DaringC  oxyCODONE-acetaminophen (PERCOCET/ROXICET) 5-325 MG tablet Take 1 tablet by mouth every 8 (eight) hours as needed for severe pain. 11/06/19   BurneMar DaringC  pregabalin (LYRICA) 150 MG capsule Take 1 capsule (150 mg total) by mouth 2 (two) times daily. 08/10/19   Burnette, Jennifer M, PA-C  SENNA PLUS 8.6-50 MG tablet TAKE ONE TABLET BY MOUTH AT BEDTIME. Patient taking differently: Take 1 tablet by mouth at bedtime.  08/11/19   BurneMar DaringC  silver sulfADIAZINE (SILVADENE) 1 % cream Apply 1 application topically daily. Apply to right first toe Patient taking differently: Apply 1 application topically daily as needed (pain).  02/21/19   BrownKris Hartmann torsemide (DEMADEX) 20 MG tablet TAKE (2) TABLETS BY MOUTH TWICE DAILY. Patient taking differently: Take 40 mg by mouth 2 (two) times daily.  04/20/19   BurneMar DaringC  triamcinolone cream (KENALOG) 0.1 % APPLY TO AFFECTED AREA TWICE DAILY 06/17/18   BurneMar DaringC  valACYclovir (VALTREX) 500 MG tablet TAKE (1) TABLET BY MOUTH EVERY OTHER DAY. 10/06/19   BurneMar DaringC  Vitamin D, Ergocalciferol, (DRISDOL) 1.25 MG (50000 UT) CAPS capsule TAKE 1 CAPSULE BY MOUTH ONCE A MONTH Patient taking differently: Take 50,000 Units by mouth every 30 (thirty) days.  06/16/19   BurneMar DaringC  gabapentin (NEURONTIN) 100 MG capsule Take 1 capsule (100 mg total) by mouth 3 (three) times  daily. 08/19/17 02/03/19  PatelFritzi Mandes   Allergies:   Shellfish allergy and Diazepam  Social History   Socioeconomic History  . Marital status: Divorced    Spouse name: Not on file  . Number of children: Not on file  . Years of education: Not on file  . Highest education level: Not on file  Occupational History  . Not on file  Tobacco Use  . Smoking status: Former Smoker    Packs/day: 0.25    Years: 6.00    Pack years: 1.50    Types: Cigarettes    Quit date: 10/25/1980    Years since quitting: 39.0  . Smokeless tobacco: Never Used  Substance and Sexual Activity  . Alcohol use: Yes    Comment: rarely  . Drug use: Yes    Types: Marijuana    Comment: Occasional marijuana use 08/21/2016 "no crack-clean since 05/1998"  . Sexual activity: Not on file    Comment: Not asked  Other Topics Concern  . Not on file  Social History Narrative  . Not on file   Social Determinants of Health   Financial Resource Strain:   . Difficulty of Paying Living Expenses:   Food Insecurity:   . Worried About Charity fundraiser in the Last Year:   . Arboriculturist in the Last Year:   Transportation Needs:   . Film/video editor (Medical):   [name] Lack of Transportation (Non-Medical):   Physical Activity:   . Days of Exercise per Week:   . Minutes of Exercise per Session:   Stress:   . Feeling of Stress :   Social Connections:   . Frequency of Communication with Friends and Family:   . Frequency of Social Gatherings with Friends and Family:   . Attends Religious Services:   . Active Member of Clubs or Organizations:   . Attends Archivist Meetings:   [name] Marital Status:      Family History:  The patient's family history includes Colon cancer in her mother; Diabetes in her paternal grandmother; Heart attack in her maternal grandmother and another family member; Hypertension in her brother and sister. ***  ROS:   Please see the history of present illness.    ROS All other  systems reviewed and are negative.   PHYSICAL EXAM:   VS:  There were no vitals taken for this visit.   GEN: Well nourished, well developed, in no acute distress HEENT: normal Neck: no JVD, carotid bruits, or masses Cardiac: ***RRR; no murmurs, rubs, or gallops,no edema  Respiratory:  clear to auscultation bilaterally, normal work of breathing GI: soft, nontender, nondistended, + BS MS: no deformity or atrophy Skin: warm and dry, no rash Neuro:  Alert and Oriented x 3, Strength and sensation are intact Psych: euthymic mood, full affect  Wt Readings from Last 3 Encounters:  11/06/19 292 lb (132.5 kg)  11/01/19 264 lb (119.7 kg)  09/19/19 289 lb (131.1 kg)      Studies/Labs Reviewed:   EKG:  EKG is ordered today.  The ekg ordered today demonstrates ***  Recent Labs: 12/28/2018: TSH 1.560 12/30/2018: Magnesium 1.8 10/21/2019: ALT 39 10/31/2019: B Natriuretic Peptide 236.5 11/01/2019: Hemoglobin 9.5; Platelets 155 11/06/2019: BUN 54; Creatinine, Ser 8.88; Potassium 4.6; Sodium 138   Lipid Panel    Component Value Date/Time   CHOL 122 09/13/2016 1245   TRIG 168 (H) 09/13/2016 1245   HDL 36 (L) 09/13/2016 1245   CHOLHDL 3.4 09/13/2016 1245   VLDL 34 09/13/2016 1245   LDLCALC 52 09/13/2016 1245    Additional studies/  records that were reviewed today include:   Echocardiogram: 07/2018 ------------------------------------------------------------------- Study Conclusions  - Left ventricle: The cavity size was normal. Wall thickness was increased in a pattern of moderate LVH. Systolic function was normal. The estimated ejection fraction was in the range of 60% to 65%. Doppler parameters are consistent with abnormal left ventricular relaxation (grade 1 diastolic dysfunction). - Aortic valve: There was mild to moderate stenosis. There was trivial regurgitation. Valve area (VTI): 2.07 cm^2. Valve area (Vmax): 1.69 cm^2. Valve area (Vmean): 1.67 cm^2. - Mitral  valve: Calcified annulus. - Left atrium: The atrium was mildly dilated. - Atrial septum: No defect or patent foramen ovale was identified.  Cardiac Catheterization:04/2017 Conclusions: 1. Widely patent mid LAD stent. 2. No significant change in mild to moderate LCx, OM, and diagonal disease compared with 09/2016. 3. Normal left ventricular filling pressure.  Recommendations: 1. Continue medical therapy and secondary prevention. I will restart isosorbide mononitrate at 90 mg daily with hope of weaning off NTG infusion later today. Gentle hydration given CKD and normal LVEDP. Hold furosemide today and reassess volume status and renal function tomorrow before restarting diuretic therapy.     ASSESSMENT & PLAN:   *** 4. CAD -No angina.  Continue aspirin, statin and beta-blocker.  2. Chronic diastolic CHF -Euvolemic.  Volume managed by dialysis as well as torsemide.  3. HTN -Blood pressure stable and well-controlled on current medication.  4. ESRD on HD -Recent left arm fistula complicated as above.  May enroll in kidney transplant.  5. HLD - Continue statin.  Check lipid panel.  Recent LFTs were normal.  6. Mild to Moderate AS - Last echo 07/2018  Aortic valve: There was mild to moderate stenosis. There was trivial regurgitation. Valve area (VTI): 2.07 cm^2. Valve area (Vmax): 1.69 cm^2. Valve area (Vmean): 1.67 cm^2.    Medication Adjustments/Labs and Tests Ordered: Current medicines are reviewed at length with the patient today.  Concerns regarding medicines are outlined above.  Medication changes, Labs and Tests ordered today are listed in the Patient Instructions below. There are no Patient Instructions on file for this visit.   Jarrett Soho, [place]  11/16/2019 1:04 PM    [facility] [address], [place], [place]  [address] Phone: [phone]; Fax: [phone]

## 2019-11-17 ENCOUNTER — Other Ambulatory Visit: Payer: Self-pay | Admitting: Physician Assistant

## 2019-11-17 DIAGNOSIS — Z1231 Encounter for screening mammogram for malignant neoplasm of breast: Secondary | ICD-10-CM

## 2019-11-18 DIAGNOSIS — Z992 Dependence on renal dialysis: Secondary | ICD-10-CM | POA: Diagnosis not present

## 2019-11-18 DIAGNOSIS — N2581 Secondary hyperparathyroidism of renal origin: Secondary | ICD-10-CM | POA: Diagnosis not present

## 2019-11-18 DIAGNOSIS — N186 End stage renal disease: Secondary | ICD-10-CM | POA: Diagnosis not present

## 2019-11-20 ENCOUNTER — Other Ambulatory Visit: Payer: Self-pay

## 2019-11-20 ENCOUNTER — Ambulatory Visit: Payer: Medicaid Other

## 2019-11-20 ENCOUNTER — Ambulatory Visit: Payer: Medicare Other | Admitting: Physician Assistant

## 2019-11-20 ENCOUNTER — Ambulatory Visit (INDEPENDENT_AMBULATORY_CARE_PROVIDER_SITE_OTHER): Payer: Medicare HMO | Admitting: Podiatry

## 2019-11-20 DIAGNOSIS — M79675 Pain in left toe(s): Secondary | ICD-10-CM | POA: Diagnosis not present

## 2019-11-20 DIAGNOSIS — M79674 Pain in right toe(s): Secondary | ICD-10-CM | POA: Diagnosis not present

## 2019-11-20 DIAGNOSIS — E0843 Diabetes mellitus due to underlying condition with diabetic autonomic (poly)neuropathy: Secondary | ICD-10-CM | POA: Diagnosis not present

## 2019-11-20 DIAGNOSIS — B351 Tinea unguium: Secondary | ICD-10-CM | POA: Diagnosis not present

## 2019-11-20 DIAGNOSIS — L603 Nail dystrophy: Secondary | ICD-10-CM

## 2019-11-20 MED ORDER — HYDROCODONE-ACETAMINOPHEN 5-325 MG PO TABS: 1.0000 | ORAL_TABLET | Freq: Four times a day (QID) | ORAL | 0 refills | Status: DC | PRN

## 2019-11-20 MED ORDER — GENTAMICIN SULFATE 0.1 % EX CREA: 1.0000 "application " | TOPICAL_CREAM | Freq: Two times a day (BID) | CUTANEOUS | 1 refills | Status: DC

## 2019-11-21 DIAGNOSIS — N186 End stage renal disease: Secondary | ICD-10-CM | POA: Diagnosis not present

## 2019-11-21 DIAGNOSIS — Z992 Dependence on renal dialysis: Secondary | ICD-10-CM | POA: Diagnosis not present

## 2019-11-21 DIAGNOSIS — N2581 Secondary hyperparathyroidism of renal origin: Secondary | ICD-10-CM | POA: Diagnosis not present

## 2019-11-22 ENCOUNTER — Ambulatory Visit: Payer: Medicaid Other

## 2019-11-23 ENCOUNTER — Ambulatory Visit: Payer: Medicaid Other

## 2019-11-23 NOTE — Progress Notes (Signed)
Subjective: Patient with a history of diabetes mellitus presents to office today complaining of elongated, thickened nails that cause pain while ambulating in shoes. She is unable to trim her own nails.  She also complains of pain to the medial borders of the bilateral great toes. She states the pain never went away after the permanent partial nail avulsion procedures she had three months ago. She reports taking other people's pain medication for treatment. She reports finishing the Doxycycline and using the Gentamicin cream as directed with no relief. Patient is here for further evaluation and treatment.   Past Medical History:  Diagnosis Date  . Anemia   . Aortic stenosis    Echo 8/18: mean 13, peak 28, LVOT/AV mean velocity 0.51  . Arthritis   . Asthma    As a child   . Bronchitis   . CAD (coronary artery disease)    a. 09/2016: 50% Ost 1st Mrg stenosis, 50% 2nd Mrg stenosis, 20% Mid-Cx, 95% Prox LAD, 40% mid-LAD, and 10% dist-LAD stenosis. Staged PCI with DES to Prox-LAD.   [name] Chronic combined systolic and diastolic CHF (congestive heart failure) ([place]) 2011   echo 2/18: EF 55-60, normal wall motion, grade 2 diastolic dysfunction, trivial AI // echo 3/18: Septal and apical HK, EF 45-50, normal wall motion, trivial AI, mild LAE, PASP 38 // echo 8/18: EF 60-65, normal wall motion, grade 1 diastolic dysfunction, calcified aortic valve leaflets, mild aortic stenosis (mean 13, peak 28, LVOT/AV mean velocity 0.51), mild AI, moderate MAC, mild LAE, trivial TR   . Chronic kidney disease    STAGE 4  . Chronic kidney disease on chronic dialysis (HCC)    t, th, sat  . Complication of anesthesia   . Depression   . Diabetes mellitus Dx 1989  . Elevated lipids   . GERD (gastroesophageal reflux disease)   . Gout   . Heart murmur    asymptomatic  . Hepatitis C Dx 2013  . Hypertension Dx 1989  . Infected surgical wound    Lt arm  . Myocardial infarction ([place]) 07/2015  . Obesity   .  Pancreatitis 2013  . Pneumonia   . Refusal of blood transfusions as patient is Jehovah's Witness   . Tendinitis   . Tremors of nervous system    LEFT HAND  . Ulcer 2010    Objective:  General: Well developed, nourished, in no acute distress, alert and oriented x3   Dermatology: Skin is warm, dry and supple bilateral. Nails are tender, long, thickened and dystrophic with subungual debris, consistent with onychomycosis, 1-5 bilateral. Hyperkeratotic, discolored, thickened, onychodystrophy noted to the right great toenail.   Vascular: Dorsalis Pedis artery and Posterior Tibial artery pedal pulses palpable. No lower extremity edema noted.   Neruologic: Grossly intact via light touch bilateral.  Musculoskeletal: Muscular strength within normal limits in all groups bilateral. Normal range of motion noted to all pedal and ankle joints.   Assesement: 1. Diabetes Mellitus w/ peripheral neuropathy 2. Onychomycosis of nail due to dermatophyte bilateral 3. Dystrophic nail right hallux    Plan of Care:  1. Patient evaluated.  2. Discussed treatment alternatives and plan of care. Explained nail avulsion procedure and post procedure course to patient. 3. Patient opted for permanent total nail avulsion of the right great toe.  4. Prior to procedure, local anesthesia infiltration utilized using 3 ml of a 50:50 mixture of 2% plain lidocaine and 0.5% plain marcaine in a normal hallux block fashion and a betadine  prep performed.  5. Total permanent nail avulsion with chemical matrixectomy performed using [id number] applications of phenol followed by alcohol flush.  6. Light dressing applied. 7. Mechanical debridement of nails 1-5 bilaterally performed using a nail nipper. Filed with dremel without incident.  8. Prescription for Gentamicin cream provided to patient to use daily with a bandage.  9. Prescription for Vicodin 5/325 mg provided to patient.  10. Return to clinic in 2 weeks.  Edrick Kins,  DPM [facility]  Dr. Edrick Kins, [address]                                        [place], [place] [address]                Office [phone]  Fax [phone]

## 2019-11-24 ENCOUNTER — Ambulatory Visit (HOSPITAL_COMMUNITY): Payer: Medicare HMO

## 2019-11-27 IMAGING — US US RENAL
1 series · 14 of 25 positions shown · non-contrast
Comparison: Renal ultrasound 03/19/2017.

CLINICAL DATA: Shortness of breath for 2 days.  Oliguria.

EXAM:
RENAL / URINARY TRACT ULTRASOUND COMPLETE

[Series 1: us renal · 0.30mm/px · 14 of 69 slices shown]
[im 1/69]
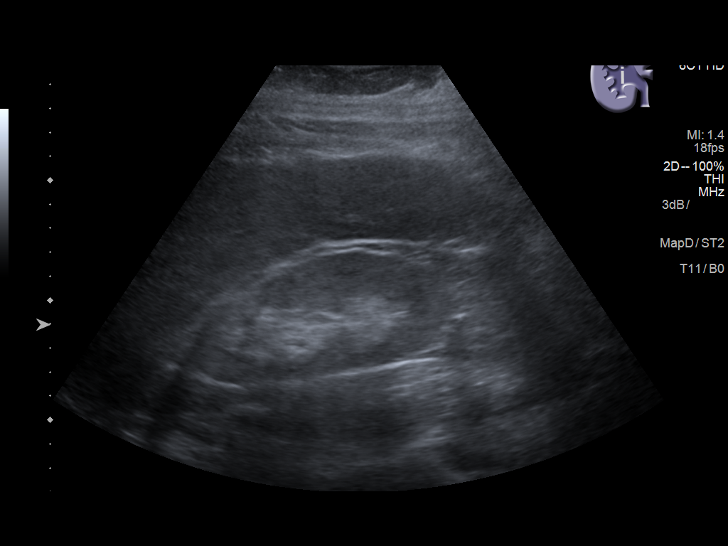
[im 6/69]
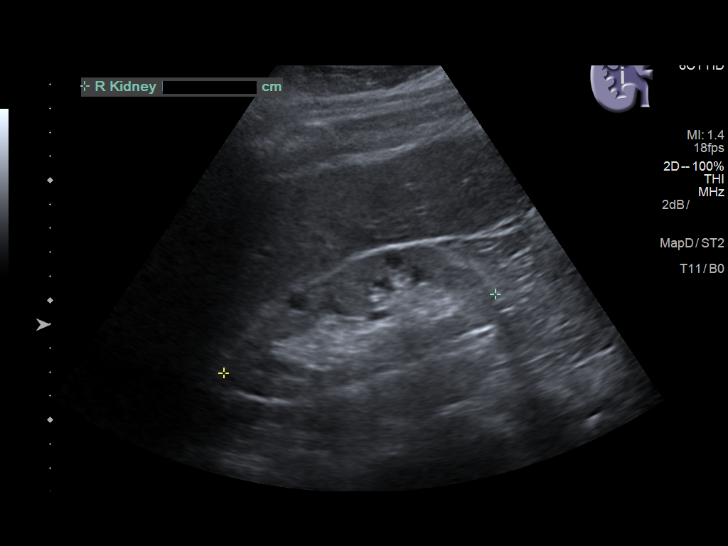
[im 12/69]
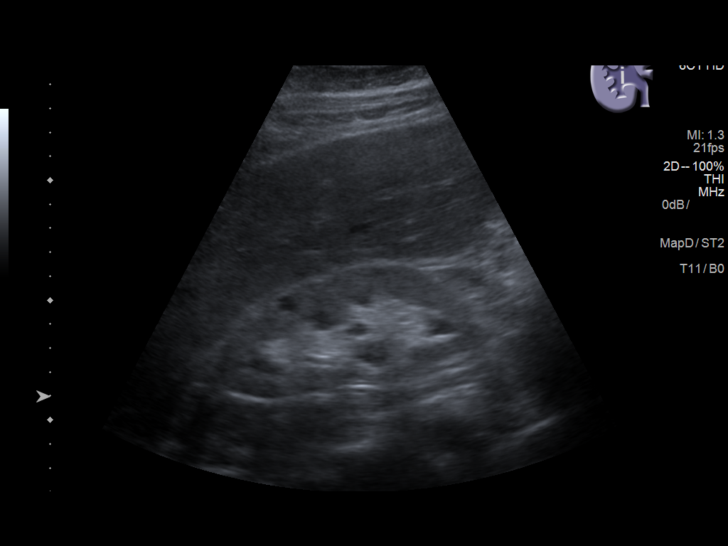
[im 18/69]
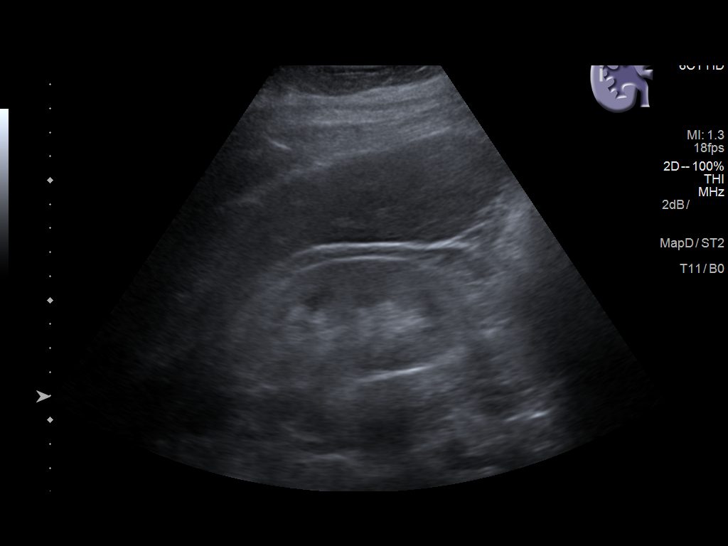
[im 23/69]
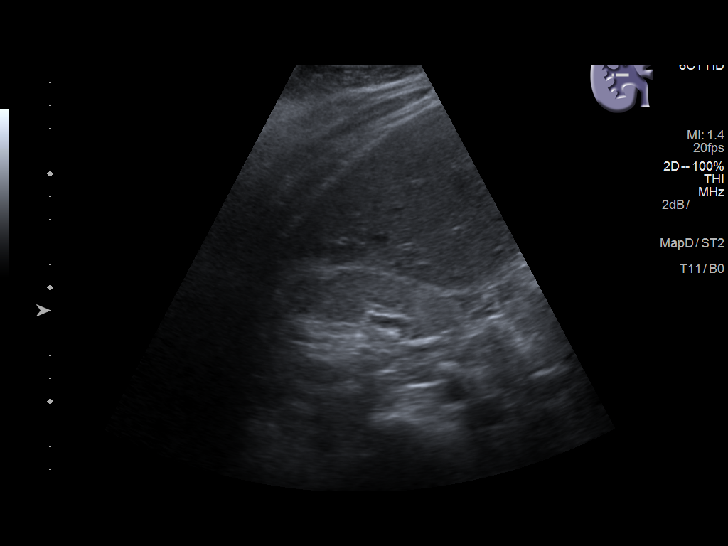
[im 26/69]
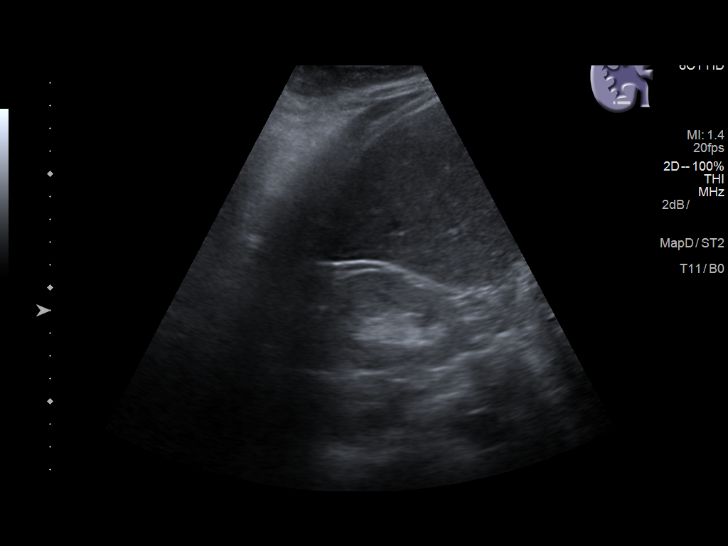
[im 32/69]
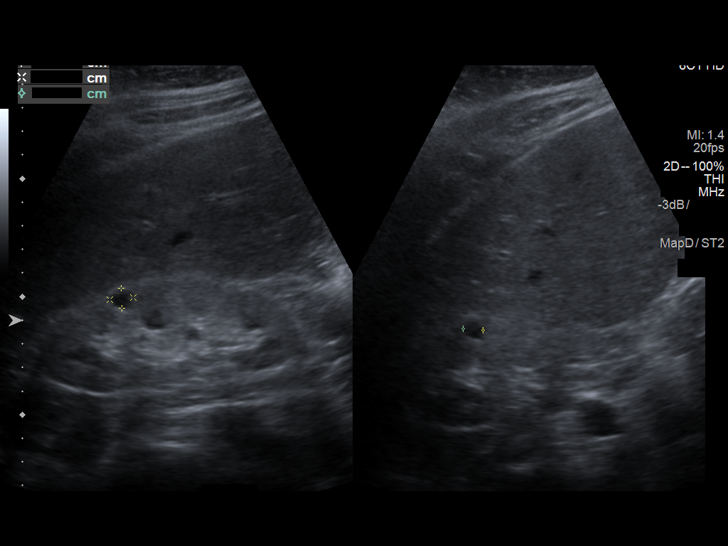
[im 37/69]
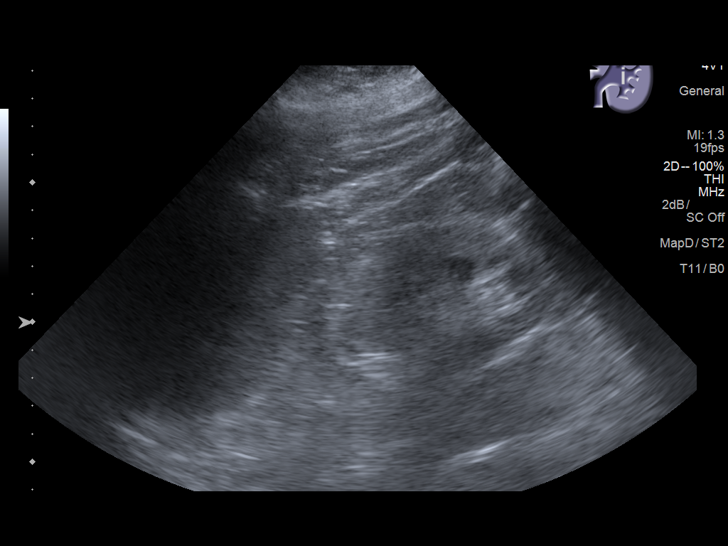
[im 43/69]
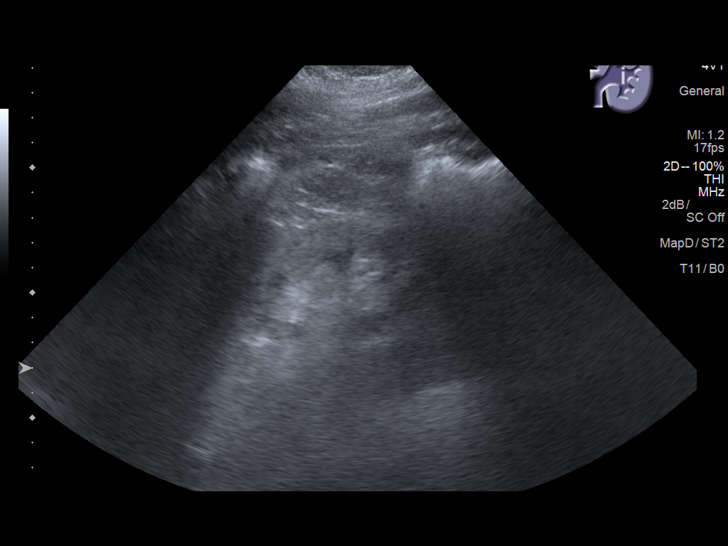
[im 46/69]
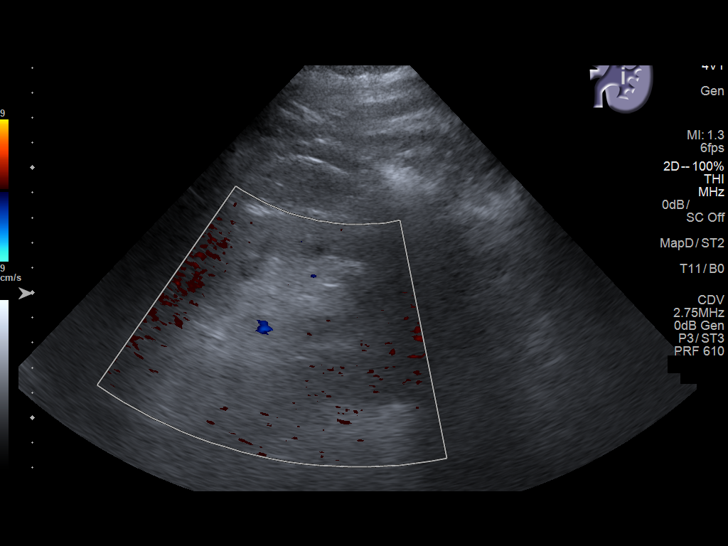
[im 52/69]
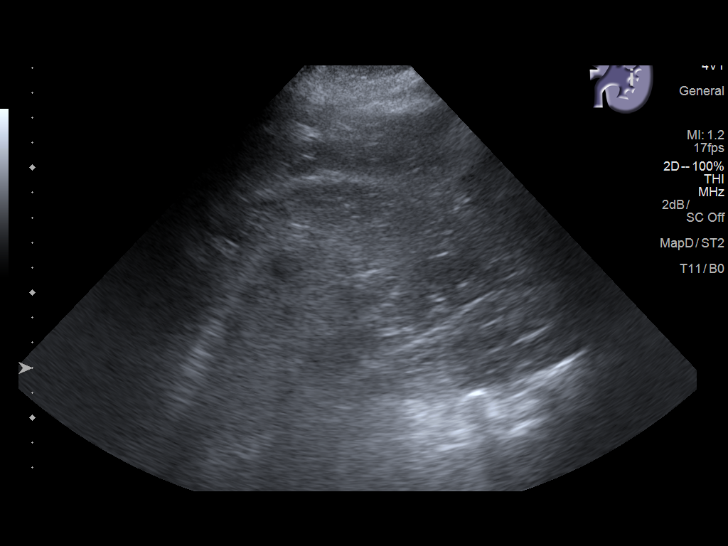
[im 57/69]
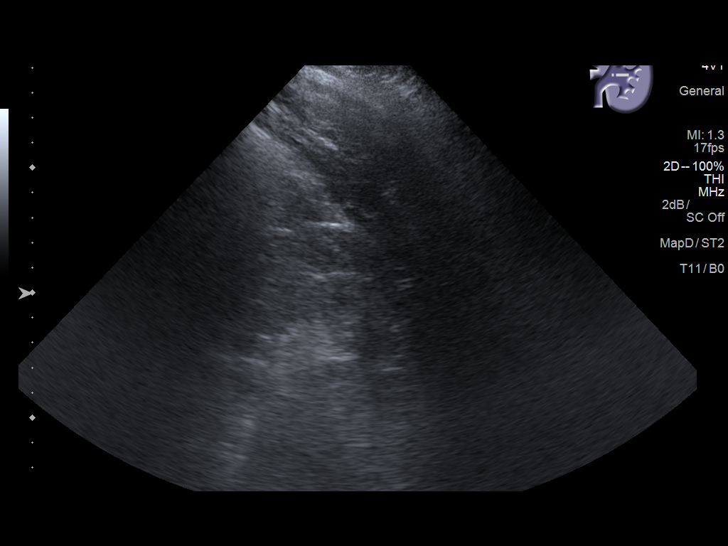
[im 63/69]
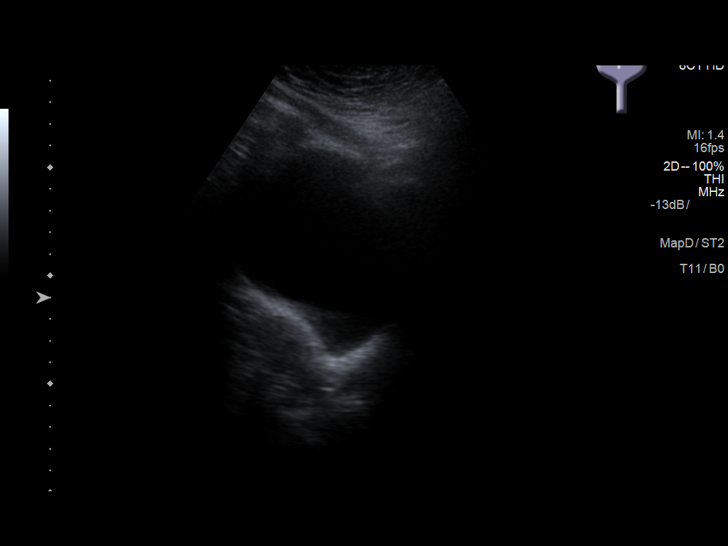
[im 69/69]
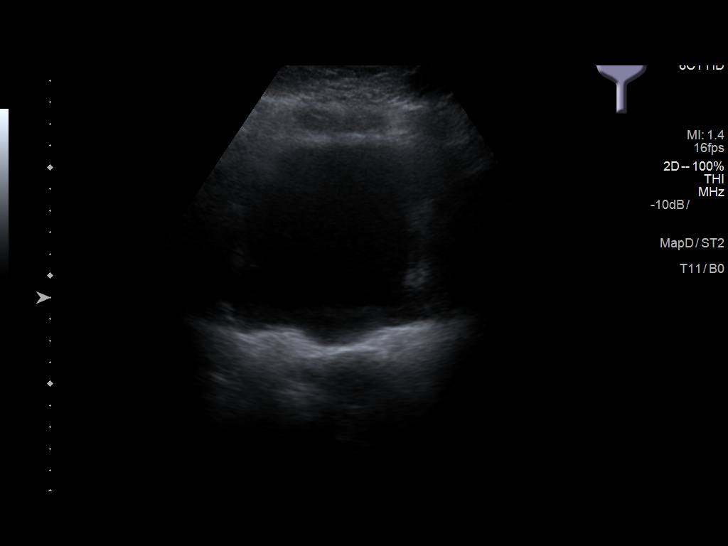

[14 of 25 positions shown; findings below may reference images not displayed]

FINDINGS: Right Kidney:

Length: 11.8 cm. Cortical echogenicity is increased. No
hydronephrosis. Two small cysts are identified measuring
approximately 1 cm in diameter.

Left Kidney:

Length: 11.2 cm. Cortical echogenicity is increased. No
hydronephrosis. Single simple cyst measures 2.4 cm in diameter.

Bladder:

Appears normal for degree of bladder distention.
IMPRESSION: Negative for hydronephrosis.  No acute abnormality.

Increased cortical echogenicity bilaterally consistent with medical
renal disease.

## 2019-11-28 ENCOUNTER — Ambulatory Visit: Payer: Medicare Other

## 2019-11-28 DIAGNOSIS — Z992 Dependence on renal dialysis: Secondary | ICD-10-CM | POA: Diagnosis not present

## 2019-11-28 DIAGNOSIS — N186 End stage renal disease: Secondary | ICD-10-CM | POA: Diagnosis not present

## 2019-11-28 DIAGNOSIS — N2581 Secondary hyperparathyroidism of renal origin: Secondary | ICD-10-CM | POA: Diagnosis not present

## 2019-11-29 ENCOUNTER — Other Ambulatory Visit: Payer: Self-pay

## 2019-11-29 ENCOUNTER — Ambulatory Visit (INDEPENDENT_AMBULATORY_CARE_PROVIDER_SITE_OTHER): Payer: Medicare HMO | Admitting: Podiatry

## 2019-11-29 DIAGNOSIS — L603 Nail dystrophy: Secondary | ICD-10-CM

## 2019-11-29 DIAGNOSIS — M79674 Pain in right toe(s): Secondary | ICD-10-CM

## 2019-11-29 DIAGNOSIS — E0843 Diabetes mellitus due to underlying condition with diabetic autonomic (poly)neuropathy: Secondary | ICD-10-CM

## 2019-11-29 DIAGNOSIS — B351 Tinea unguium: Secondary | ICD-10-CM

## 2019-11-29 DIAGNOSIS — M79675 Pain in left toe(s): Secondary | ICD-10-CM

## 2019-11-29 IMAGING — CT CT FEMUR *L* W/ CM
3 series · 12 of 35 positions shown, 14 images · IV contrast (agent unspecified)
Comparison: CT scan 06/05/2017

CONTRAST:  75mL 2T7XTA-B00 IOPAMIDOL (2T7XTA-B00) INJECTION 61%

CLINICAL DATA: Left thigh abscess.

EXAM:
CT OF THE LOWER RIGHT EXTREMITY WITH CONTRAST
TECHNIQUE: Multidetector CT imaging of the lower right extremity was performed
according to the standard protocol following intravenous contrast
administration.

[Series 4: axial st · axial · 0.63mm/px · z∈[+143,+509]mm · 4 of 265 slices shown, 5 images]
[im 41/265  soft-tissue]
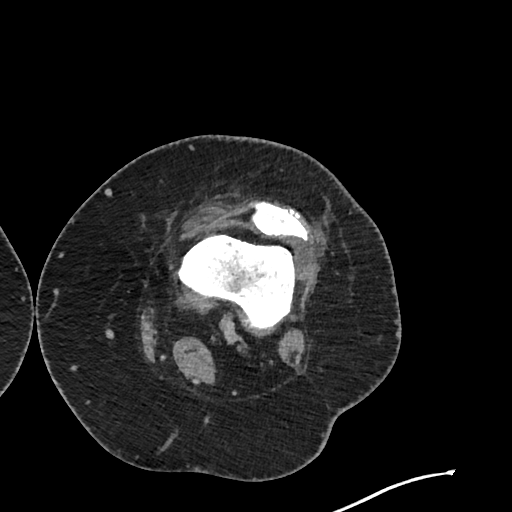
[im 41/265  bone]
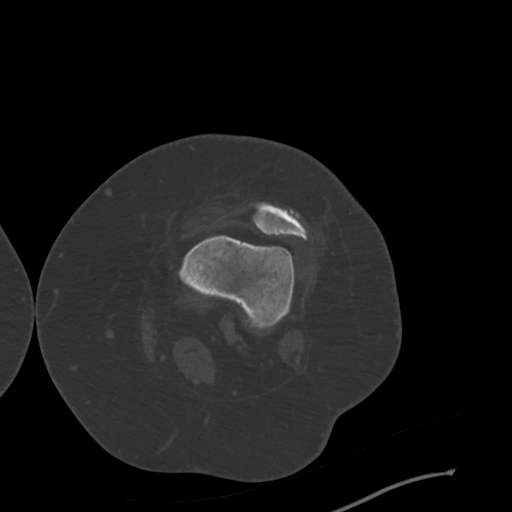
[im 102/265  bone]
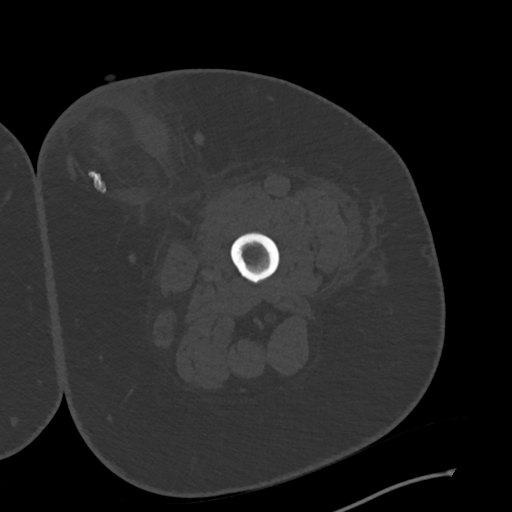
[im 163/265  bone]
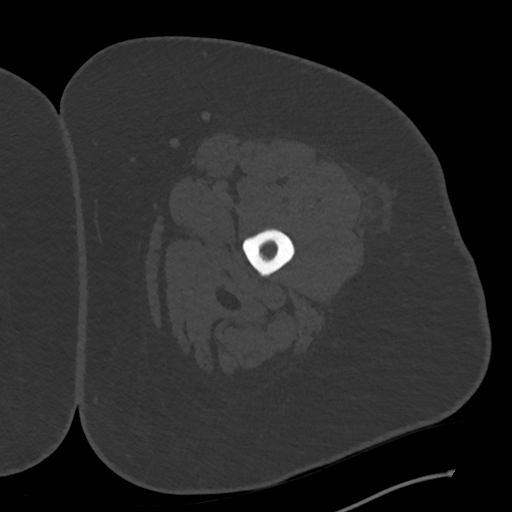
[im 224/265  bone]
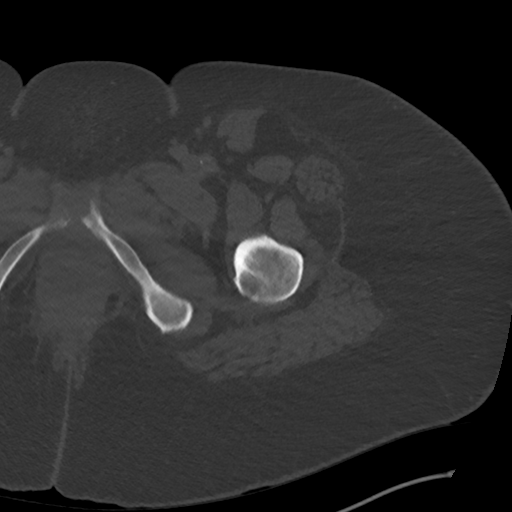

[Series 7: coronal st · coronal · 0.63mm/px · 3 of 151 slices shown]
[im 31/151  bone]
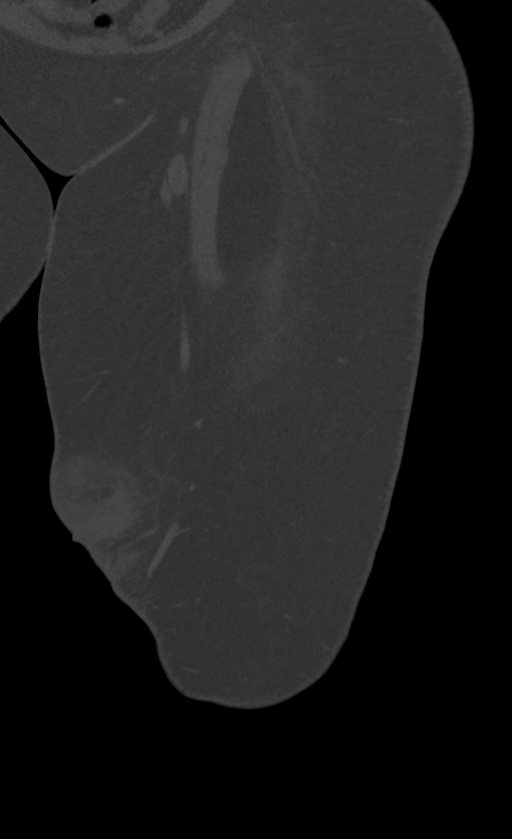
[im 61/151  bone]
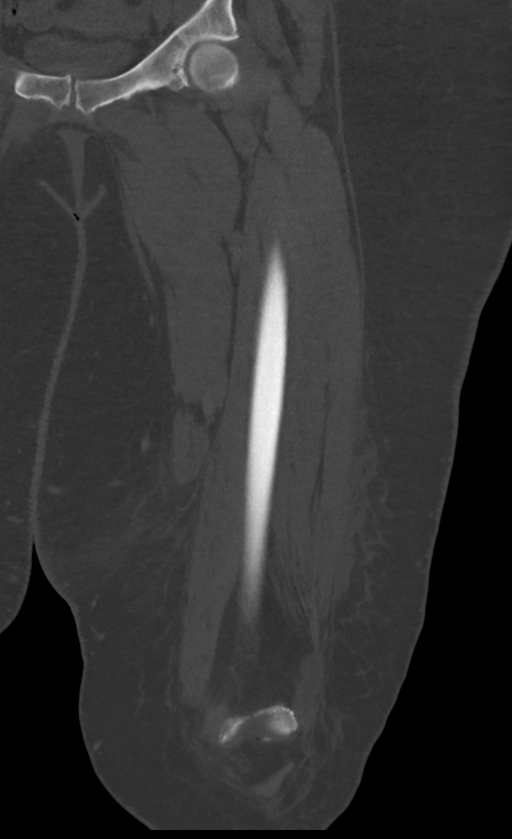
[im 91/151  bone]
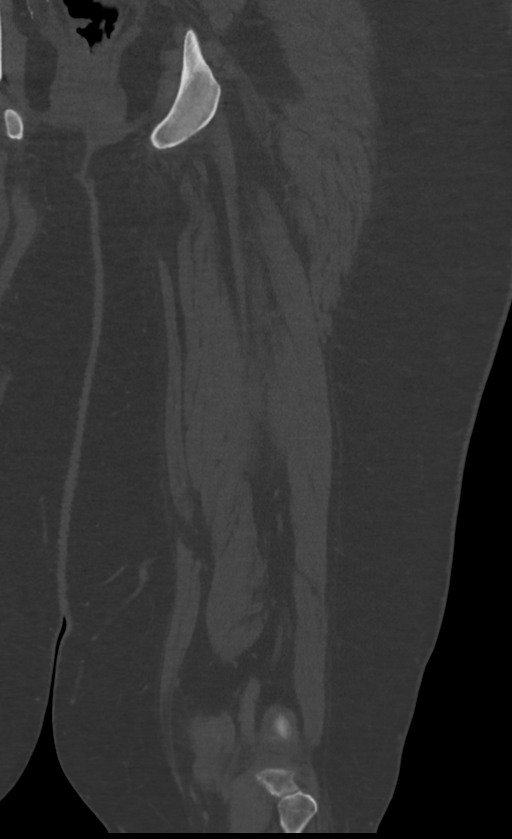

[Series 8: sagittal st · sagittal · 0.69mm/px · 5 of 158 slices shown, 6 images]
[im 53/158  bone]
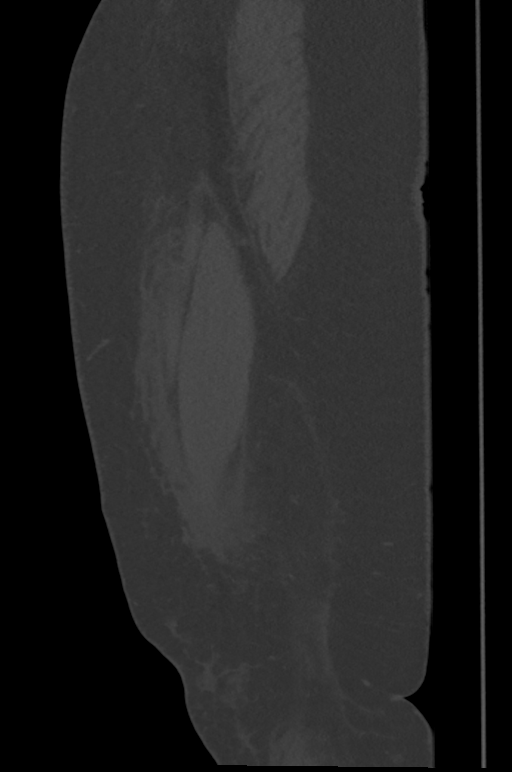
[im 66/158  bone]
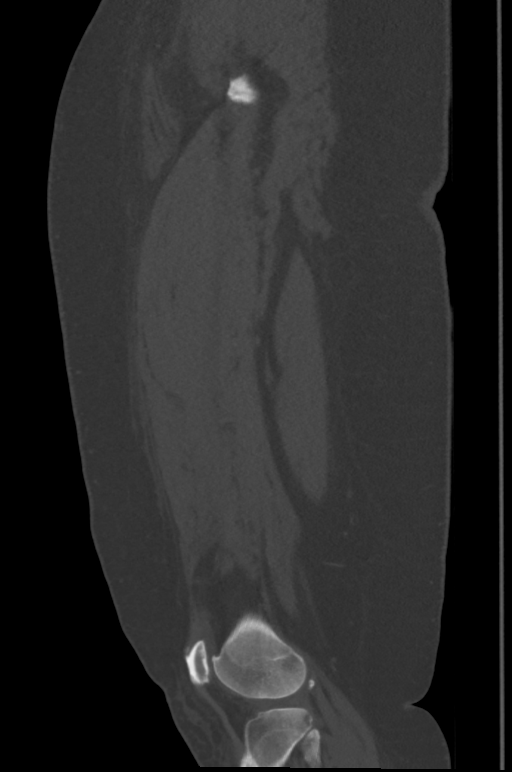
[im 79/158  soft-tissue]
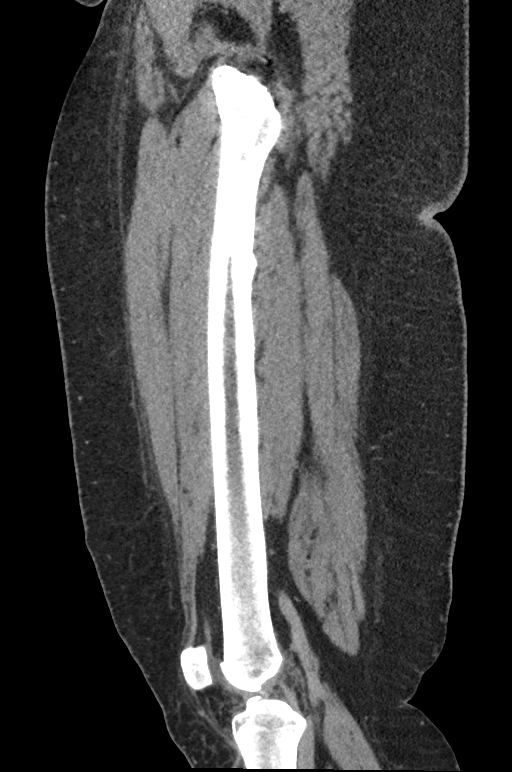
[im 79/158  bone]
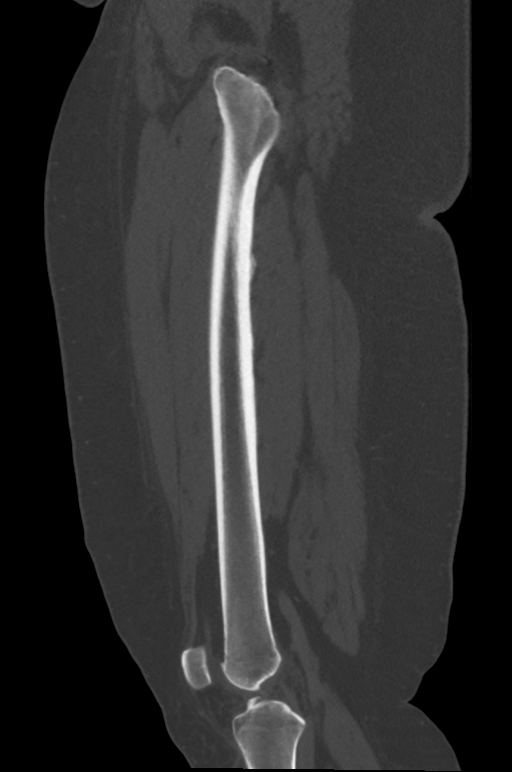
[im 92/158  bone]
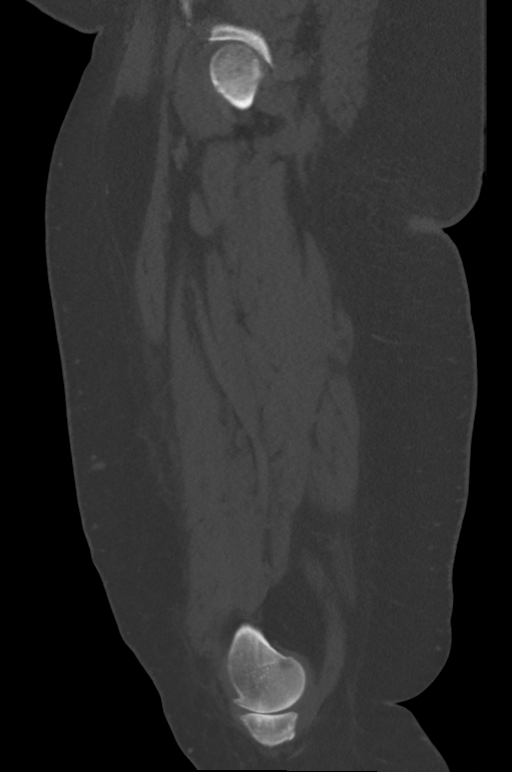
[im 105/158  bone]
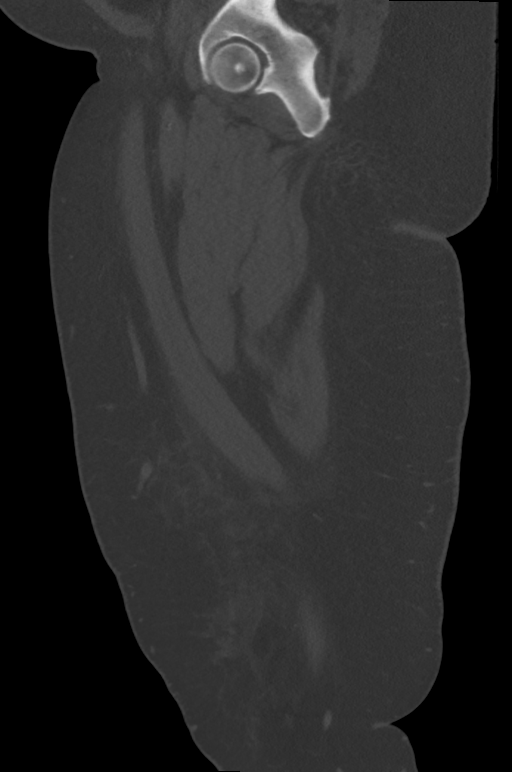

[12 of 35 positions shown; findings below may reference images not displayed]

FINDINGS: There is a persistent or recurrent subcutaneous abscess involving
the left anteromedial thigh. Significant surrounding inflammation
and phlegmonous changes along with focal cellulitis and skin
thickening. The entire area measures approximately 9.7 x 6.3 cm.
There is some fluid and edema surrounding the deep muscular fascia
but I do not see any findings for myofasciitis or pyomyositis.

Left hip joint degenerative changes but no evidence of septic
arthritis. No findings to suggest osteomyelitis involving the left
femur. Moderate left knee joint degenerative changes but no joint
effusion or septic arthritis.

Mildly enlarged/inflamed left inguinal lymph nodes.
IMPRESSION: Recurrent left medial anterior thigh abscess and cellulitis.

## 2019-11-30 DIAGNOSIS — N2581 Secondary hyperparathyroidism of renal origin: Secondary | ICD-10-CM | POA: Diagnosis not present

## 2019-11-30 DIAGNOSIS — Z992 Dependence on renal dialysis: Secondary | ICD-10-CM | POA: Diagnosis not present

## 2019-11-30 DIAGNOSIS — N186 End stage renal disease: Secondary | ICD-10-CM | POA: Diagnosis not present

## 2019-12-02 DIAGNOSIS — N186 End stage renal disease: Secondary | ICD-10-CM | POA: Diagnosis not present

## 2019-12-02 DIAGNOSIS — Z992 Dependence on renal dialysis: Secondary | ICD-10-CM | POA: Diagnosis not present

## 2019-12-02 DIAGNOSIS — N2581 Secondary hyperparathyroidism of renal origin: Secondary | ICD-10-CM | POA: Diagnosis not present

## 2019-12-03 IMAGING — DX DG CHEST 1V PORT
1 series · 1 of 1 positions shown · non-contrast
Comparison: 07/15/2017.

CLINICAL DATA: Shortness of breath today.  Ex-smoker.

EXAM:
PORTABLE CHEST 1 VIEW

[chest ap]
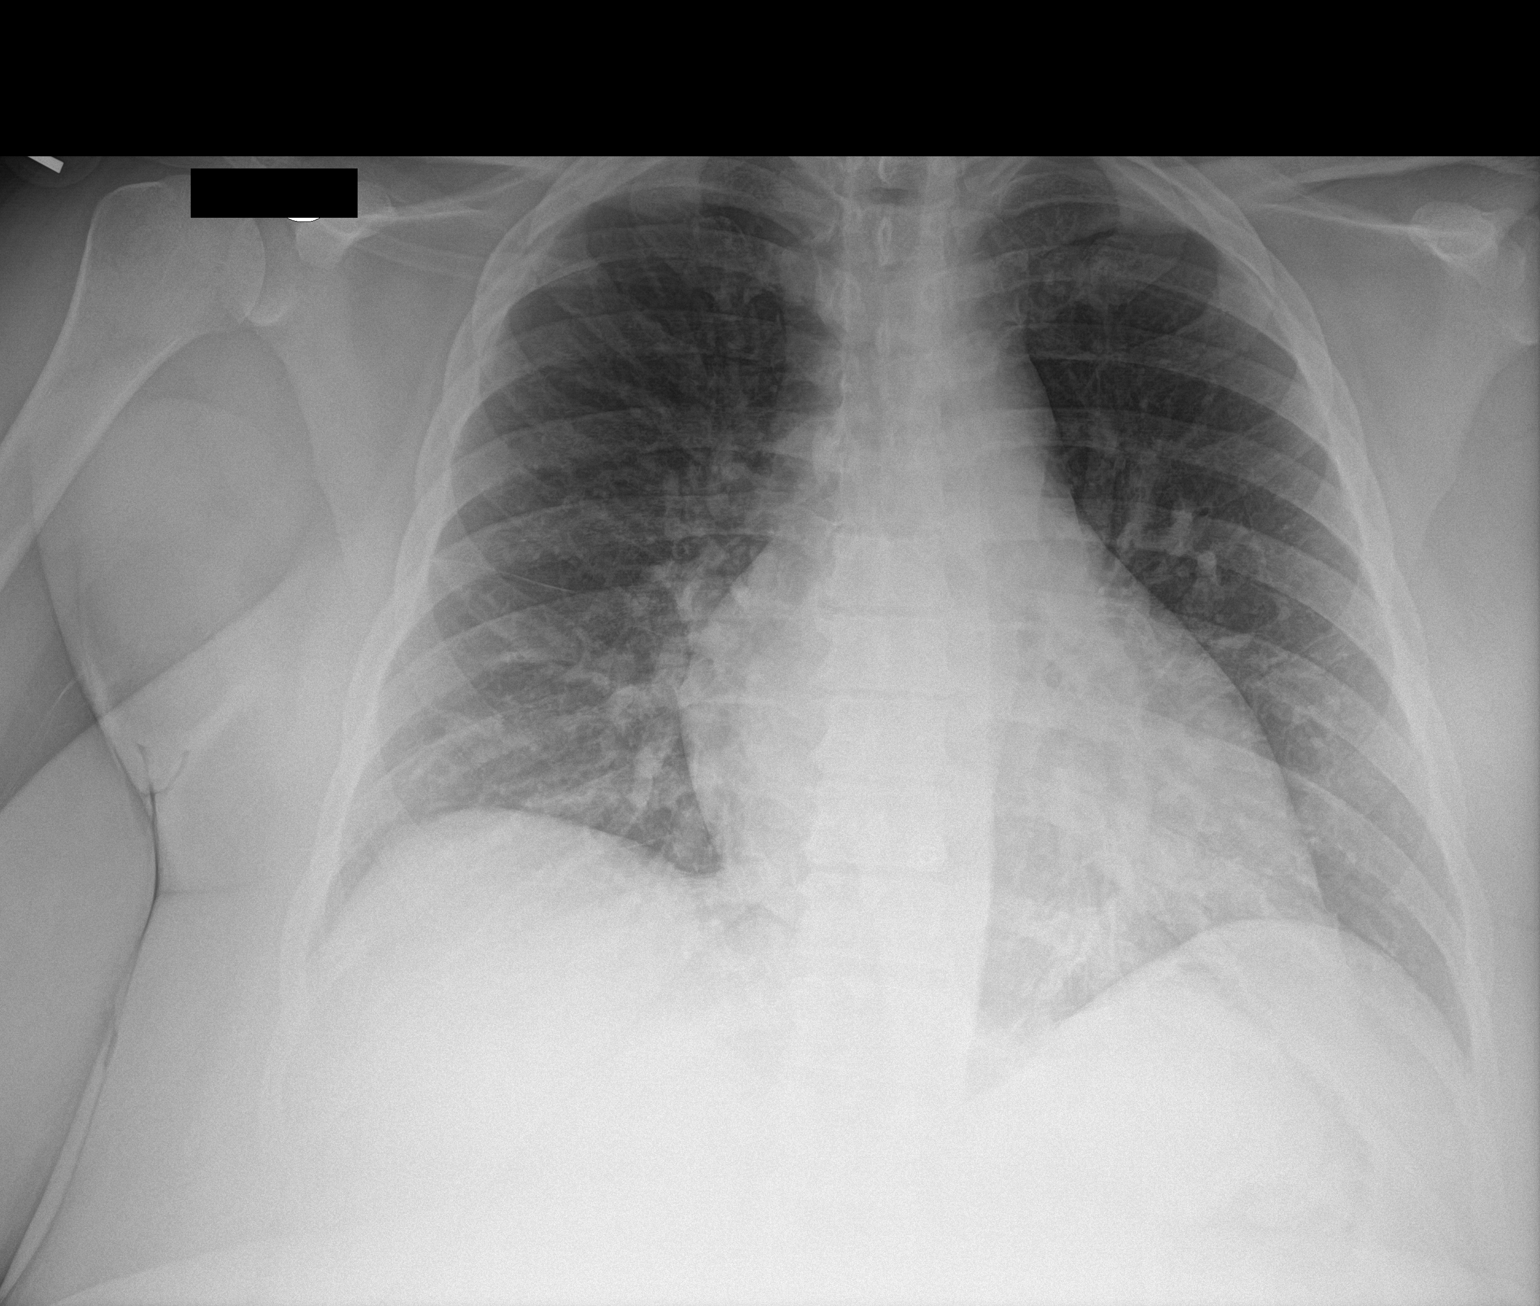

[1 of 1 positions shown; findings below may reference images not displayed]

FINDINGS: Interval borderline enlarged cardiac silhouette. Stable mild
prominence of the pulmonary vasculature and interstitial markings.
No pleural fluid. Mild thoracic spine degenerative changes.
IMPRESSION: Interval borderline cardiomegaly with stable mild pulmonary vascular
congestion and mild chronic interstitial lung disease.

## 2019-12-03 NOTE — Progress Notes (Signed)
Subjective: 64 y.o. female presenting today status post total permanent nail avulsion procedure of the right hallux that was performed on 11/20/2019. She reports significant pain of the toe. She admits to not soaking the toe as instructed and only used the Gentamicin cream intermittently. She states her dialysis physician has referred her to a vein and vascular specialist secondary to the absence of pedal pulses of the right foot. Patient is here for further evaluation and treatment.    Past Medical History:  Diagnosis Date  . Anemia   . Aortic stenosis    Echo 8/18: mean 13, peak 28, LVOT/AV mean velocity 0.51  . Arthritis   . Asthma    As a child   . Bronchitis   . CAD (coronary artery disease)    a. 09/2016: 50% Ost 1st Mrg stenosis, 50% 2nd Mrg stenosis, 20% Mid-Cx, 95% Prox LAD, 40% mid-LAD, and 10% dist-LAD stenosis. Staged PCI with DES to Prox-LAD.   [name] Chronic combined systolic and diastolic CHF (congestive heart failure) ([place]) 2011   echo 2/18: EF 55-60, normal wall motion, grade 2 diastolic dysfunction, trivial AI // echo 3/18: Septal and apical HK, EF 45-50, normal wall motion, trivial AI, mild LAE, PASP 38 // echo 8/18: EF 60-65, normal wall motion, grade 1 diastolic dysfunction, calcified aortic valve leaflets, mild aortic stenosis (mean 13, peak 28, LVOT/AV mean velocity 0.51), mild AI, moderate MAC, mild LAE, trivial TR   . Chronic kidney disease    STAGE 4  . Chronic kidney disease on chronic dialysis (HCC)    t, th, sat  . Complication of anesthesia   . Depression   . Diabetes mellitus Dx 1989  . Elevated lipids   . GERD (gastroesophageal reflux disease)   . Gout   . Heart murmur    asymptomatic  . Hepatitis C Dx 2013  . Hypertension Dx 1989  . Infected surgical wound    Lt arm  . Myocardial infarction ([place]) 07/2015  . Obesity   . Pancreatitis 2013  . Pneumonia   . Refusal of blood transfusions as patient is Jehovah's Witness   . Tendinitis   . Tremors of  nervous system    LEFT HAND  . Ulcer 2010     Objective: Skin is warm, dry and supple. Nail bed and respective nail fold appears to be healing appropriately. Open wound to the associated nail fold with a granular wound base and moderate amount of fibrotic tissue. Minimal drainage noted. Mild erythema around the periungual region likely due to phenol chemical matricectomy. Pulses palpable bilateral +2/4.   Assessment: #1 postop permanent total nail avulsion right great toe  Plan of care: #1 patient was evaluated  #2 light debridement of open wound was performed to the periungual border of the respective toe using a currette and tissue nipper. Antibiotic ointment and Band-Aid was applied. #3 appointment with vascular pending.  #4 Continue using Gentamicin cream daily with a bandage.  #5 Refill prescription for Gentamicin cream provided to patient.  #6 Recommended good shoe gear.  #7 Return to clinic as needed.    Edrick Kins, DPM [facility]  Dr. Edrick Kins, DPM    [address],  [address]  Office [phone]  Fax [phone]

## 2019-12-04 ENCOUNTER — Ambulatory Visit (INDEPENDENT_AMBULATORY_CARE_PROVIDER_SITE_OTHER): Payer: Medicare HMO | Admitting: Nurse Practitioner

## 2019-12-04 ENCOUNTER — Encounter (INDEPENDENT_AMBULATORY_CARE_PROVIDER_SITE_OTHER): Payer: Medicare HMO

## 2019-12-05 DIAGNOSIS — N2581 Secondary hyperparathyroidism of renal origin: Secondary | ICD-10-CM | POA: Diagnosis not present

## 2019-12-05 DIAGNOSIS — N186 End stage renal disease: Secondary | ICD-10-CM | POA: Diagnosis not present

## 2019-12-05 DIAGNOSIS — Z992 Dependence on renal dialysis: Secondary | ICD-10-CM | POA: Diagnosis not present

## 2019-12-06 ENCOUNTER — Other Ambulatory Visit: Payer: Self-pay | Admitting: Physician Assistant

## 2019-12-06 DIAGNOSIS — M62830 Muscle spasm of back: Secondary | ICD-10-CM

## 2019-12-07 DIAGNOSIS — N2581 Secondary hyperparathyroidism of renal origin: Secondary | ICD-10-CM | POA: Diagnosis not present

## 2019-12-07 DIAGNOSIS — N186 End stage renal disease: Secondary | ICD-10-CM | POA: Diagnosis not present

## 2019-12-07 DIAGNOSIS — Z992 Dependence on renal dialysis: Secondary | ICD-10-CM | POA: Diagnosis not present

## 2019-12-08 ENCOUNTER — Other Ambulatory Visit: Payer: Self-pay | Admitting: Physician Assistant

## 2019-12-08 DIAGNOSIS — M1A39X0 Chronic gout due to renal impairment, multiple sites, without tophus (tophi): Secondary | ICD-10-CM

## 2019-12-08 DIAGNOSIS — E559 Vitamin D deficiency, unspecified: Secondary | ICD-10-CM

## 2019-12-08 DIAGNOSIS — E1142 Type 2 diabetes mellitus with diabetic polyneuropathy: Secondary | ICD-10-CM

## 2019-12-08 NOTE — Telephone Encounter (Signed)
Requested Prescriptions  Pending Prescriptions Disp Refills  . Vitamin D, Ergocalciferol, (DRISDOL) 1.25 MG (50000 UNIT) CAPS capsule [Pharmacy Med Name: VIT D2 ERGOCAL (50,000 UNIT)] 1 capsule 11    Sig: TAKE 1 CAPSULE BY MOUTH ONCE A MONTH     Endocrinology:  Vitamins - Vitamin D Supplementation Failed - 12/08/2019  1:52 PM      Failed - 50,000 IU strengths are not delegated      Failed - Ca in normal range and within 360 days    Calcium  Date Value Ref Range Status  11/06/2019 8.3 (L) 8.7 - 10.3 mg/dL Final   Calcium, Ion  Date Value Ref Range Status  09/14/2015 1.00 (L) 1.12 - 1.23 mmol/L Final         Failed - Phosphate in normal range and within 360 days    Phosphorus  Date Value Ref Range Status  11/06/2019 7.0 (H) 3.0 - 4.3 mg/dL Final         Failed - Vitamin D in normal range and within 360 days    No results found for: [id number], [id number], [id number], 25OHVITD3, 25OHVITD2, 25OHVITD3, 25OHVITD2, 25OHVITD1, 25OHVITD2, 25OHVITD3, VD25OH       Passed - Valid encounter within last 12 months    Recent Outpatient Visits          1 month ago Diabetes mellitus type 2, uncontrolled, with complications [facility])   [place], [place], [place]   4 months ago Chest pain, unspecified type   [facility], [place], [place]   6 months ago Acute right-sided low back pain without sciatica   [place], [place], [place]   9 months ago Chronic hepatitis C without hepatic coma [facility])   [place], Clearnce Sorrel, [place]   1 year ago ESRD on dialysis [facility])   [place], Clearnce Sorrel, [place]      Future Appointments            In 1 month Burnette, Clearnce Sorrel, PA-C [employer], [place]           . allopurinol (ZYLOPRIM) 100 MG tablet [Pharmacy Med Name: ALLOPURINOL 100 MG TABLET] 28 tablet 11    Sig: TAKE 1 TABLET BY MOUTH ONCE DAILY.     Endocrinology:  Gout  Agents Failed - 12/08/2019  1:52 PM      Failed - Uric Acid in normal range and within 360 days    Uric Acid, Serum  Date Value Ref Range Status  12/27/2017 5.8 2.3 - 6.6 mg/dL Final    Comment:    Performed at [facility], [place] [address] [place], [place] [address]   Uric Acid  Date Value Ref Range Status  02/12/2017 12.1 (H) 2.5 - 7.1 mg/dL Final    Comment:               Therapeutic target for gout patients: <6.0         Failed - Cr in normal range and within 360 days    Creat  Date Value Ref Range Status  09/23/2016 1.36 (H) 0.50 - 1.05 mg/dL Final    Comment:      For patients > or = 62 years of age: The upper reference limit for Creatinine is approximately 13% higher for people identified as African-American.      Creatinine, Ser  Date Value Ref Range Status  11/06/2019 8.88 (H) 0.57 - 1.00 mg/dL Final  Creatinine, Urine  Date Value Ref Range Status  03/19/2017 94.44 mg/dL Final  03/19/2017 95.79 mg/dL Final         Passed - Valid encounter within last 12 months    Recent Outpatient Visits          1 month ago Diabetes mellitus type 2, uncontrolled, with complications [facility])   [place], [place]   4 months ago Chest pain, unspecified type   [place], [place]   6 months ago Acute right-sided low back pain without sciatica   [place], [place]   9 months ago Chronic hepatitis C without hepatic coma [facility])   [place], Clearnce Sorrel, [place]   1 year ago ESRD on dialysis [facility])   [place], Clearnce Sorrel, [place]      Future Appointments            In 1 month Burnette, Clearnce Sorrel, PA-C [employer], [place]           . pregabalin (LYRICA) 75 MG capsule [Pharmacy Med Name: PREGABALIN 75 MG CAPSULE] 56 capsule 5    Sig: TAKE (1) CAPSULE BY MOUTH TWICE DAILY.     Not Delegated -  Neurology:  Anticonvulsants - Controlled Failed - 12/08/2019  1:52 PM      Failed - This refill cannot be delegated      Passed - Valid encounter within last 12 months    Recent Outpatient Visits          1 month ago Diabetes mellitus type 2, uncontrolled, with complications [facility])   [place], [place]   4 months ago Chest pain, unspecified type   [place], [place]   6 months ago Acute right-sided low back pain without sciatica   [place], [place]   9 months ago Chronic hepatitis C without hepatic coma [facility])   [place], Clearnce Sorrel, [place]   1 year ago ESRD on dialysis [facility])   [place], Clearnce Sorrel, [place]      Future Appointments            In 1 month Burnette, Clearnce Sorrel, PA-C [employer], [place]

## 2019-12-08 NOTE — Telephone Encounter (Signed)
Requested medication (s) are due for refill today: Vit D, yes  Requested medication (s) are on the active medication list: yes  Last refill:  11/09/19  Future visit scheduled: yes  Notes to clinic: not delegated  Requested medication (s) are due for refill today: Pregabalin, no  Requested medication (s) are on the active medication list: no  Last refill:  ?  Future visit scheduled: yes  Notes to clinic:  not delegated; dose changed 08/10/19 Requested Prescriptions  Pending Prescriptions Disp Refills   Vitamin D, Ergocalciferol, (DRISDOL) 1.25 MG (50000 UNIT) CAPS capsule [Pharmacy Med Name: VIT D2 ERGOCAL (50,000 UNIT)] 1 capsule 11    Sig: TAKE [place] A MONTH      Endocrinology:  Vitamins - Vitamin D Supplementation Failed - 12/08/2019  1:52 PM      Failed - 50,000 IU strengths are not delegated      Failed - Ca in normal range and within 360 days    Calcium  Date Value Ref Range Status  11/06/2019 8.3 (L) 8.7 - 10.3 mg/dL Final   Calcium, Ion  Date Value Ref Range Status  09/14/2015 1.00 (L) 1.12 - 1.23 mmol/L Final          Failed - Phosphate in normal range and within 360 days    Phosphorus  Date Value Ref Range Status  11/06/2019 7.0 (H) 3.0 - 4.3 mg/dL Final          Failed - Vitamin D in normal range and within 360 days    No results found for: [id number], [id number], [id number], 25OHVITD3, 25OHVITD2, 25OHVITD3, 25OHVITD2, 25OHVITD1, 25OHVITD2, 25OHVITD3, VD25OH        Passed - Valid encounter within last 12 months    Recent Outpatient Visits           1 month ago Diabetes mellitus type 2, uncontrolled, with complications [facility])   [place], [place], [place]   4 months ago Chest pain, unspecified type   [facility], [place], [place]   6 months ago Acute right-sided low back pain without sciatica   [facility], [place], [place]   9 months ago Chronic hepatitis C without  hepatic coma [facility])   [place], [place], [place]   1 year ago ESRD on dialysis [facility])   [place], Clearnce Sorrel, [place]       Future Appointments             In 1 month Burnette, Clearnce Sorrel, PA-C [place], [facility]              pregabalin (LYRICA) 75 MG capsule [Pharmacy Med Name: PREGABALIN 75 MG CAPSULE] 56 capsule 5    Sig: TAKE (1) CAPSULE BY MOUTH TWICE DAILY.      Not Delegated - Neurology:  Anticonvulsants - Controlled Failed - 12/08/2019  1:52 PM      Failed - This refill cannot be delegated      Passed - Valid encounter within last 12 months    Recent Outpatient Visits           1 month ago Diabetes mellitus type 2, uncontrolled, with complications [facility])   [place], [place]   4 months ago Chest pain, unspecified type   [place], [place]   6 months ago Acute right-sided low back pain without sciatica   [facility], [place]   9 months ago Chronic hepatitis C without hepatic coma [facility])   [place], [place]   1 year ago ESRD on dialysis [facility])   [place], Clearnce Sorrel, [place]       Future Appointments             In 1 month Burnette, Clearnce Sorrel, PA-C [employer], [facility]             Signed Prescriptions Disp Refills   allopurinol (ZYLOPRIM) 100 MG tablet 28 tablet 11    Sig: TAKE 1 TABLET BY MOUTH ONCE DAILY.      Endocrinology:  Gout Agents Failed - 12/08/2019  1:52 PM      Failed - Uric Acid in normal range and within 360 days    Uric Acid, Serum  Date Value Ref Range Status  12/27/2017 5.8 2.3 - 6.6 mg/dL Final    Comment:    Performed at [facility], [place] [address] [place], [place] [address]   Uric Acid  Date Value Ref Range Status  02/12/2017 12.1 (H) 2.5 - 7.1 mg/dL Final    Comment:                Therapeutic target for gout patients: <6.0          Failed - Cr in normal range and within 360 days    Creat  Date Value Ref Range Status  09/23/2016 1.36 (H) 0.50 - 1.05 mg/dL Final    Comment:      For patients > or = 62 years of age: The upper reference limit for Creatinine is approximately 13% higher for people identified as African-American.      Creatinine, Ser  Date Value Ref Range Status  11/06/2019 8.88 (H) 0.57 - 1.00 mg/dL Final   Creatinine, Urine  Date Value Ref Range Status  03/19/2017 94.44 mg/dL Final  03/19/2017 95.79 mg/dL Final          Passed - Valid encounter within last 12 months    Recent Outpatient Visits           1 month ago Diabetes mellitus type 2, uncontrolled, with complications [facility])   [place], [place]   4 months ago Chest pain, unspecified type   [place], [place]   6 months ago Acute right-sided low back pain without sciatica   [place], [place]   9 months ago Chronic hepatitis C without hepatic coma [facility])   [place], Clearnce Sorrel, [place]   1 year ago ESRD on dialysis [facility])   [place], Clearnce Sorrel, [place]       Future Appointments             In 1 month Burnette, Clearnce Sorrel, PA-C [employer], [place]

## 2019-12-09 DIAGNOSIS — N186 End stage renal disease: Secondary | ICD-10-CM | POA: Diagnosis not present

## 2019-12-09 DIAGNOSIS — N2581 Secondary hyperparathyroidism of renal origin: Secondary | ICD-10-CM | POA: Diagnosis not present

## 2019-12-09 DIAGNOSIS — Z992 Dependence on renal dialysis: Secondary | ICD-10-CM | POA: Diagnosis not present

## 2019-12-11 DIAGNOSIS — E1122 Type 2 diabetes mellitus with diabetic chronic kidney disease: Secondary | ICD-10-CM | POA: Diagnosis not present

## 2019-12-11 DIAGNOSIS — Z992 Dependence on renal dialysis: Secondary | ICD-10-CM | POA: Diagnosis not present

## 2019-12-11 DIAGNOSIS — N186 End stage renal disease: Secondary | ICD-10-CM | POA: Diagnosis not present

## 2019-12-12 DIAGNOSIS — Z992 Dependence on renal dialysis: Secondary | ICD-10-CM | POA: Diagnosis not present

## 2019-12-12 DIAGNOSIS — N186 End stage renal disease: Secondary | ICD-10-CM | POA: Diagnosis not present

## 2019-12-12 DIAGNOSIS — N2581 Secondary hyperparathyroidism of renal origin: Secondary | ICD-10-CM | POA: Diagnosis not present

## 2019-12-13 ENCOUNTER — Other Ambulatory Visit: Payer: Self-pay

## 2019-12-13 ENCOUNTER — Emergency Department
Admission: EM | Admit: 2019-12-13 | Discharge: 2019-12-13 | Disposition: A | Discharge status: Home / Self Care | Payer: Medicare HMO | Attending: Student in an Organized Health Care Education/Training Program | Admitting: Student in an Organized Health Care Education/Training Program

## 2019-12-13 ENCOUNTER — Emergency Department: Payer: Medicare HMO

## 2019-12-13 DIAGNOSIS — I2510 Atherosclerotic heart disease of native coronary artery without angina pectoris: Secondary | ICD-10-CM | POA: Diagnosis not present

## 2019-12-13 DIAGNOSIS — R109 Unspecified abdominal pain: Secondary | ICD-10-CM

## 2019-12-13 DIAGNOSIS — Z87891 Personal history of nicotine dependence: Secondary | ICD-10-CM | POA: Diagnosis not present

## 2019-12-13 DIAGNOSIS — Z992 Dependence on renal dialysis: Secondary | ICD-10-CM | POA: Diagnosis not present

## 2019-12-13 DIAGNOSIS — I10 Essential (primary) hypertension: Secondary | ICD-10-CM | POA: Diagnosis not present

## 2019-12-13 DIAGNOSIS — Z79899 Other long term (current) drug therapy: Secondary | ICD-10-CM | POA: Diagnosis not present

## 2019-12-13 DIAGNOSIS — Z7982 Long term (current) use of aspirin: Secondary | ICD-10-CM | POA: Diagnosis not present

## 2019-12-13 DIAGNOSIS — J45909 Unspecified asthma, uncomplicated: Secondary | ICD-10-CM | POA: Insufficient documentation

## 2019-12-13 DIAGNOSIS — I5042 Chronic combined systolic (congestive) and diastolic (congestive) heart failure: Secondary | ICD-10-CM | POA: Diagnosis not present

## 2019-12-13 DIAGNOSIS — N186 End stage renal disease: Secondary | ICD-10-CM | POA: Insufficient documentation

## 2019-12-13 DIAGNOSIS — E1122 Type 2 diabetes mellitus with diabetic chronic kidney disease: Secondary | ICD-10-CM | POA: Diagnosis not present

## 2019-12-13 DIAGNOSIS — R1012 Left upper quadrant pain: Secondary | ICD-10-CM | POA: Diagnosis not present

## 2019-12-13 DIAGNOSIS — I132 Hypertensive heart and chronic kidney disease with heart failure and with stage 5 chronic kidney disease, or end stage renal disease: Secondary | ICD-10-CM | POA: Insufficient documentation

## 2019-12-13 DIAGNOSIS — R112 Nausea with vomiting, unspecified: Secondary | ICD-10-CM | POA: Diagnosis not present

## 2019-12-13 LAB — URINALYSIS, COMPLETE (UACMP) WITH MICROSCOPIC
Bilirubin Urine: NEGATIVE
Glucose, UA: NEGATIVE mg/dL
Hgb urine dipstick: NEGATIVE
Ketones, ur: NEGATIVE mg/dL
Nitrite: NEGATIVE
Protein, ur: 30 mg/dL — AB
Specific Gravity, Urine: 1.017 (ref 1.005–1.030)
WBC, UA: >50 WBC/hpf — ABNORMAL HIGH (ref 0–5)
pH: 5 (ref 5.0–8.0)

## 2019-12-13 LAB — BASIC METABOLIC PANEL
Anion gap: 14 (ref 5–15)
BUN: 54 mg/dL — ABNORMAL HIGH (ref 8–23)
CO2: 25 mmol/L (ref 22–32)
Calcium: 8.3 mg/dL — ABNORMAL LOW (ref 8.9–10.3)
Chloride: 98 mmol/L (ref 98–111)
Creatinine, Ser: 7.17 mg/dL — ABNORMAL HIGH (ref 0.44–1.00)
GFR calc Af Amer: 7 mL/min — ABNORMAL LOW (ref >60)
GFR calc non Af Amer: 6 mL/min — ABNORMAL LOW (ref >60)
Glucose, Bld: 177 mg/dL — ABNORMAL HIGH (ref 70–99)
Potassium: 5.4 mmol/L — ABNORMAL HIGH (ref 3.5–5.1)
Sodium: 137 mmol/L (ref 135–145)

## 2019-12-13 LAB — CBC
HCT: 35.4 % — ABNORMAL LOW (ref 36.0–46.0)
Hemoglobin: 11.6 g/dL — ABNORMAL LOW (ref 12.0–15.0)
MCH: 31.9 pg (ref 26.0–34.0)
MCHC: 32.8 g/dL (ref 30.0–36.0)
MCV: 97.3 fL (ref 80.0–100.0)
Platelets: 204 10*3/uL (ref 150–400)
RBC: 3.64 MIL/uL — ABNORMAL LOW (ref 3.87–5.11)
RDW: 15.6 % — ABNORMAL HIGH (ref 11.5–15.5)
WBC: 7.1 10*3/uL (ref 4.0–10.5)
nRBC: 0 % (ref 0.0–0.2)

## 2019-12-13 MED ORDER — PATIROMER SORBITEX CALCIUM 8.4 G PO PACK
16.8000 g | PACK | Freq: Every day | ORAL | Status: DC
  Administered 2019-12-13: 16.8 g via ORAL
  Filled 2019-12-13: qty 2

## 2019-12-13 MED ORDER — OXYCODONE-ACETAMINOPHEN 5-325 MG PO TABS
1.0000 | ORAL_TABLET | Freq: Once | ORAL | Status: AC
  Administered 2019-12-13: 1 via ORAL
  Filled 2019-12-13: qty 1

## 2019-12-13 MED ORDER — ONDANSETRON 4 MG PO TBDP
4.0000 mg | ORAL_TABLET | Freq: Once | ORAL | Status: AC
  Administered 2019-12-13: 4 mg via ORAL
  Filled 2019-12-13: qty 1

## 2019-12-13 MED ORDER — HYDROCODONE-ACETAMINOPHEN 5-325 MG PO TABS: 1.0000 | ORAL_TABLET | ORAL | 0 refills | Status: DC | PRN

## 2019-12-13 MED ORDER — MORPHINE SULFATE (PF) 4 MG/ML IV SOLN
4.0000 mg | INTRAVENOUS | Status: DC | PRN
  Administered 2019-12-13: 4 mg via INTRAMUSCULAR
  Filled 2019-12-13: qty 1

## 2019-12-13 MED ORDER — CEPHALEXIN 250 MG PO CAPS: 250.0000 mg | ORAL_CAPSULE | Freq: Every day | ORAL | 0 refills | Status: AC

## 2019-12-13 NOTE — ED Notes (Signed)
AAOx3.  Skin warm and dry.  NAD 

## 2019-12-13 NOTE — ED Provider Notes (Signed)
[facility] Emergency Department Provider Note    First MD Initiated Contact with Patient 12/13/19 1407     (approximate)  I have reviewed the triage vital signs and the nursing notes.   HISTORY  Chief Complaint Flank Pain    HPI [name] is a 64 y.o. female [place] past medical history presents to the ER for evaluation of several weeks of right flank pain is progressively worsening associated with nausea and vomiting.  Denies any fevers.  States she got dialysis yesterday.  States the pain is crampy in nature is worsened with movement.  Denies any diarrhea.  Does still make urine but denies any dysuria or hematuria.  Denies any trauma.  Denies any chest pain or shortness of breath.    Past Medical History:  Diagnosis Date  . Anemia   . Aortic stenosis    Echo 8/18: mean 13, peak 28, LVOT/AV mean velocity 0.51  . Arthritis   . Asthma    As a child   . Bronchitis   . CAD (coronary artery disease)    a. 09/2016: 50% Ost 1st Mrg stenosis, 50% 2nd Mrg stenosis, 20% Mid-Cx, 95% Prox LAD, 40% mid-LAD, and 10% dist-LAD stenosis. Staged PCI with DES to Prox-LAD.   [name] Chronic combined systolic and diastolic CHF (congestive heart failure) ([place]) 2011   echo 2/18: EF 55-60, normal wall motion, grade 2 diastolic dysfunction, trivial AI // echo 3/18: Septal and apical HK, EF 45-50, normal wall motion, trivial AI, mild LAE, PASP 38 // echo 8/18: EF 60-65, normal wall motion, grade 1 diastolic dysfunction, calcified aortic valve leaflets, mild aortic stenosis (mean 13, peak 28, LVOT/AV mean velocity 0.51), mild AI, moderate MAC, mild LAE, trivial TR   . Chronic kidney disease    STAGE 4  . Chronic kidney disease on chronic dialysis (HCC)    t, th, sat  . Complication of anesthesia   . Depression   . Diabetes mellitus Dx 1989  . Elevated lipids   . GERD (gastroesophageal reflux disease)   . Gout   . Heart murmur    asymptomatic  . Hepatitis C Dx 2013  .  Hypertension Dx 1989  . Infected surgical wound    Lt arm  . Myocardial infarction ([place]) 07/2015  . Obesity   . Pancreatitis 2013  . Pneumonia   . Refusal of blood transfusions as patient is Jehovah's Witness   . Tendinitis   . Tremors of nervous system    LEFT HAND  . Ulcer 2010   Family History  Problem Relation Age of Onset  . Colon cancer Mother   . Heart attack Other   . Heart attack Maternal Grandmother   . Hypertension Sister   . Hypertension Brother   . Diabetes Paternal Grandmother   . Breast cancer Neg Hx    Past Surgical History:  Procedure Laterality Date  . A/V FISTULAGRAM Left 04/11/2019   Procedure: A/V FISTULAGRAM;  Surgeon: Katha Cabal, MD;  Location: [place] [facility];  Service: Cardiovascular;  Laterality: Left;  . A/V FISTULAGRAM Left 06/02/2019   Procedure: A/V FISTULAGRAM;  Surgeon: Katha Cabal, MD;  Location: [place] [facility];  Service: Cardiovascular;  Laterality: Left;  . APPLICATION OF WOUND VAC Left 08/14/2017   Procedure: APPLICATION OF WOUND VAC Exchange;  Surgeon: Robert Bellow, MD;  Location: [facility];  Service: General;  Laterality: Left;  . APPLICATION OF WOUND VAC Left 12/21/2018   Procedure: APPLICATION OF  WOUND VAC;  Surgeon: Katha Cabal, MD;  Location: [facility];  Service: Vascular;  Laterality: Left;  . AV FISTULA PLACEMENT Left 08/19/2018   Procedure: ARTERIOVENOUS (AV) FISTULA CREATION ( BRACHIOBASILIC );  Surgeon: Katha Cabal, MD;  Location: [facility];  Service: Vascular;  Laterality: Left;  . BASCILIC VEIN TRANSPOSITION Left 11/18/2018   Procedure: BASCILIC VEIN TRANSPOSITION;  Surgeon: Katha Cabal, MD;  Location: [facility];  Service: Vascular;  Laterality: Left;  . CHOLECYSTECTOMY    . COLONOSCOPY WITH PROPOFOL N/A 02/03/2018   Procedure: COLONOSCOPY WITH PROPOFOL;  Surgeon: Lin Landsman, MD;  Location: [facility];  Service: Gastroenterology;  Laterality: N/A;  . CORONARY ANGIOPLASTY   07/2015   STENT  . CORONARY STENT INTERVENTION N/A 09/18/2016   Procedure: Coronary Stent Intervention;  Surgeon: Troy Sine, MD;  Location: [place] [facility];  Service: Cardiovascular;  Laterality: N/A;  . DIALYSIS/PERMA CATHETER INSERTION N/A 05/10/2018   Procedure: DIALYSIS/PERMA CATHETER INSERTION;  Surgeon: Katha Cabal, MD;  Location: [place] [facility];  Service: Cardiovascular;  Laterality: N/A;  . DRESSING CHANGE UNDER ANESTHESIA Left 08/15/2017   Procedure: exploration of wound for bleeding;  Surgeon: Robert Bellow, MD;  Location: [facility];  Service: General;  Laterality: Left;  . ESOPHAGOGASTRODUODENOSCOPY (EGD) WITH PROPOFOL N/A 02/03/2018   Procedure: ESOPHAGOGASTRODUODENOSCOPY (EGD) WITH PROPOFOL;  Surgeon: Lin Landsman, MD;  Location: [facility] ENDOSCOPY;  Service: Gastroenterology;  Laterality: N/A;  . EYE SURGERY  11/17/2018  . INCISION AND DRAINAGE ABSCESS Left 08/12/2017   Procedure: INCISION AND DRAINAGE ABSCESS;  Surgeon: Robert Bellow, MD;  Location: [facility];  Service: General;  Laterality: Left;  . KNEE ARTHROSCOPY    . LEFT HEART CATH N/A 09/18/2016   Procedure: Left Heart Cath;  Surgeon: Troy Sine, MD;  Location: [place] [facility];  Service: Cardiovascular;  Laterality: N/A;  . LEFT HEART CATH AND CORONARY ANGIOGRAPHY N/A 09/16/2016   Procedure: Left Heart Cath and Coronary Angiography;  Surgeon: Burnell Blanks, MD;  Location: [place] [facility];  Service: Cardiovascular;  Laterality: N/A;  . LEFT HEART CATH AND CORONARY ANGIOGRAPHY N/A 04/29/2017   Procedure: LEFT HEART CATH AND CORONARY ANGIOGRAPHY;  Surgeon: Nelva Bush, MD;  Location: [place] [facility];  Service: Cardiovascular;  Laterality: N/A;  . LOWER EXTREMITY ANGIOGRAPHY Right 03/08/2018   Procedure: LOWER EXTREMITY ANGIOGRAPHY;  Surgeon: Katha Cabal, MD;  Location: [place] [facility];  Service: Cardiovascular;  Laterality: Right;  . TUBAL LIGATION    . TUBAL  LIGATION    . UPPER EXTREMITY ANGIOGRAPHY Right 09/19/2019   Procedure: UPPER EXTREMITY ANGIOGRAPHY;  Surgeon: Katha Cabal, MD;  Location: [place] [facility];  Service: Cardiovascular;  Laterality: Right;  . WOUND DEBRIDEMENT Left 12/21/2018   Procedure: DEBRIDEMENT WOUND;  Surgeon: Katha Cabal, MD;  Location: [facility];  Service: Vascular;  Laterality: Left;  . WOUND DEBRIDEMENT Left 12/30/2018   Procedure: DEBRIDEMENT WOUND WITH VAC PLACEMENT (LEFT UPPER EXTREMITY);  Surgeon: Katha Cabal, MD;  Location: [facility];  Service: Vascular;  Laterality: Left;   Patient Active Problem List   Diagnosis Date Noted  . Diabetic polyneuropathy associated with type 2 diabetes mellitus ([place]) 11/06/2019  . Hemodialysis status ([place]) 11/06/2019  . Nystagmus 11/06/2019  . Intermittent claudication ([place]) 11/06/2019  . Vertigo of central origin 11/06/2019  . Weakness   . Hypervolemia associated with renal insufficiency 10/31/2019  . Ascending aortic aneurysm ([place]) 03/27/2019  . Mild protein-calorie  malnutrition ([place]) 01/25/2019  . Puncture wound without foreign body of left forearm, initial encounter 01/02/2019  . Non-healing wound of upper extremity 12/28/2018  . Unspecified open wound of left upper arm, initial encounter 12/06/2018  . Complication from renal dialysis device 10/20/2018  . Coagulation defect, unspecified ([place]) 10/18/2018  . Iron deficiency anemia, unspecified 10/18/2018  . Secondary hyperparathyroidism of renal origin ([place]) 10/18/2018  . ESRD (end stage renal disease) on dialysis ([place]) 06/01/2018  . ARF (acute renal failure) ([place]) 05/09/2018  . Colon cancer screening   . LGSIL on Pap smear of cervix 01/27/2018  . Gout 12/25/2017  . AKI (acute kidney injury) ([place]) 12/24/2017  . Hyperglycemia 12/24/2017  . Chronic diastolic heart failure ([place]) 09/30/2017  . Lymphedema 09/30/2017  . Aortic stenosis   . Chronic pain of right knee 02/12/2017  . Chronic gout due to renal  impairment of multiple sites without tophus 02/12/2017  . Esophageal dysphagia 02/12/2017  . Osteoarthritis 01/22/2017  . Diabetic hyperosmolar non-ketotic state ([place]) 12/13/2016  . CAD (coronary artery disease) 12/13/2016  . Hyperlipidemia 12/13/2016  . Hyperphosphatemia 12/13/2016  . Asthma 11/29/2015  . Depression 09/05/2015  . GERD (gastroesophageal reflux disease) 03/25/2015  . Environmental allergies 03/14/2015  . SOB (shortness of breath) 02/15/2015  . Chronic hepatitis C without hepatic coma ([place]) 01/31/2015  . Diabetic neuropathy ([place]) 01/31/2015  . OSA (obstructive sleep apnea) 01/01/2015  . Poor dentition 11/13/2014  . Essential hypertension 10/08/2014  . Morbid (severe) obesity due to excess calories ([place]) 10/08/2014  . Diabetes mellitus type 2, uncontrolled, with complications ([place]) 09/73/5329    Class: Chronic      Prior to Admission medications   Medication Sig Start Date End Date Taking? Authorizing Provider  albuterol (VENTOLIN HFA) 108 (90 Base) MCG/ACT inhaler Inhale 2 puffs into the lungs every 4 (four) hours as needed for wheezing or shortness of breath. 10/21/19 12/13/19 Yes Soto, Johana, PA-C  allopurinol (ZYLOPRIM) 100 MG tablet TAKE 1 TABLET BY MOUTH ONCE DAILY. 12/08/19  Yes [name], PA-C  aspirin EC 81 MG EC tablet Take 1 tablet (81 mg total) by mouth daily. 09/19/16  Yes Strader, [place] M, PA-C  atorvastatin (LIPITOR) 80 MG tablet TAKE 1 TABLET BY MOUTH ONCE DAILY AT 6PM 11/09/19  Yes [name], PA-C  b complex-vitamin c-folic acid (NEPHRO-VITE) 0.8 MG TABS tablet Take 1 tablet by mouth daily.   Yes [provider]  carvedilol (COREG) 25 MG tablet TAKE (1) TABLET BY MOUTH TWICE A DAY WITH MEALS (BREAKFAST AND SUPPER) 11/09/19  Yes Burnette, Clearnce Sorrel, PA-C  cyclobenzaprine (FLEXERIL) 5 MG tablet Take 1 tablet (5 mg total) by mouth 3 (three) times daily as needed for muscle spasms. 08/10/19  Yes [name], PA-C    dicyclomine (BENTYL) 20 MG tablet TAKE 1 TABLET(20 MG) BY MOUTH FOUR TIMES DAILY BEFORE MEALS AND AT BEDTIME 10/16/19  Yes Burnette, Clearnce Sorrel, PA-C  docusate sodium (COLACE) 100 MG capsule Take 1 capsule (100 mg total) by mouth 2 (two) times daily as needed for mild constipation. 09/20/17  Yes Gouru, Illene Silver, MD  doxepin (SINEQUAN) 10 MG capsule Take 1 capsule (10 mg total) by mouth 4 (four) times daily as needed. For itching 11/07/19  Yes Burnette, Clearnce Sorrel, PA-C  Vitamin D, Ergocalciferol, (DRISDOL) 1.25 MG (50000 UT) CAPS capsule TAKE 1 CAPSULE BY MOUTH ONCE A MONTH Patient taking differently: Take 50,000 Units by mouth every 30 (thirty) days.  06/16/19  Yes [name], PA-C  AURYXIA 1 GM 210 MG(Fe) tablet Take 420 mg by mouth 3 (three) times daily with meals.  10/10/18   [provider]  cephALEXin (KEFLEX) 250 MG capsule Take 1 capsule (250 mg total) by mouth daily for 5 days. 12/13/19 12/18/19  Merlyn Lot, MD  fluticasone (FLONASE) 50 MCG/ACT nasal spray Place 2 sprays into both nostrils daily as needed for allergies or rhinitis. 09/28/17   [name], PA-C  fluticasone furoate-vilanterol (BREO ELLIPTA) 200-25 MCG/INH AEPB Inhale 1 puff into the lungs daily. 11/07/19   [name], PA-C  gentamicin cream (GARAMYCIN) 0.1 % Apply 1 application topically 2 (two) times daily. 11/20/19   Edrick Kins, DPM  hydrALAZINE (APRESOLINE) 100 MG tablet TAKE (1) TABLET BY MOUTH THREE TIMES DAILY Patient taking differently: Take 100 mg by mouth 3 (three) times daily.  08/14/19   [name], PA-C  HYDROcodone-acetaminophen (NORCO) 5-325 MG tablet Take 1 tablet by mouth every 4 (four) hours as needed for moderate pain. 12/13/19   Merlyn Lot, MD  HYDROcodone-acetaminophen (NORCO/VICODIN) 5-325 MG tablet Take 1 tablet by mouth every 6 (six) hours as needed for moderate pain. 11/20/19   Edrick Kins, DPM  LEVEMIR FLEXTOUCH 100 UNIT/ML FlexPen INJECT 60 UNITS UNDER  THE SKIN TWICE DAILY 11/07/19   [name], PA-C  lidocaine-prilocaine (EMLA) cream Apply 1 application topically as needed. 03/27/19   Schnier, Dolores Lory, MD  liraglutide (VICTOZA) 18 MG/3ML SOPN Start with 0.33m daily and increase by 0.629mper week until max dose of 1.8 mg dose achieved. Patient taking differently: Inject 0.6-1.8 mg into the skin See admin instructions. Start with 0.57m23maily and increase by 0.8mg37mr week until max dose of 1.2mg 45me achieved. 09/08/19   BurneMar DaringC  loratadine (CLARITIN) 10 MG tablet TAKE 1 TABLET BY MOUTH ONCE DAILY. Patient taking differently: Take 10 mg by mouth at bedtime.  08/11/19   BurneMar DaringC  mometasone (ELOCON) 0.1 % cream APPLY AS DIRECTED TO AFFECTED AREA ONCE DAILY. Patient taking differently: Apply 1 application topically daily as needed.  12/08/18   BurneMar DaringC  montelukast (SINGULAIR) 10 MG tablet TAKE ONE TABLET BY MOUTH AT BEDTIME. Patient taking differently: Take 10 mg by mouth at bedtime.  08/11/19   BurneMar DaringC  nitroGLYCERIN (NITROSTAT) 0.4 MG SL tablet Place 1 tablet (0.4 mg total) under the tongue every 5 (five) minutes x 3 doses as needed for chest pain. 02/03/19   Bhagat, Bhavinkumar, PA  NOVOLOG FLEXPEN 100 UNIT/ML FlexPen USE 25 UNITS UNDER THE SKIN THREE TIMES DAILY WITH MEALS Patient taking differently: Inject 25 Units into the skin 3 (three) times daily with meals.  07/18/19   BurneMar DaringC  nystatin cream (MYCOSTATIN) Apply 1 application topically 2 (two) times daily. Patient taking differently: Apply 1 application topically daily as needed for dry skin.  08/10/19   BurneMar DaringC  omeprazole (PRILOSEC) 20 MG capsule TAKE (1) CAPSULE BY MOUTH ONCE DAILY. Patient taking differently: Take 20 mg by mouth every evening.  08/11/19   BurneMar DaringC  oxyCODONE-acetaminophen (PERCOCET/ROXICET) 5-325 MG tablet Take 1 tablet by mouth every 8  (eight) hours as needed for severe pain. 11/06/19   BurneMar DaringC  pregabalin (LYRICA) 150 MG capsule Take 1 capsule (150 mg total) by mouth 2 (two) times daily. 08/10/19   Burnette, JenniClearnce SorrelC  SENNA PLUS 8.6-50 MG tablet TAKE ONE TABLET BY MOUTH  AT BEDTIME. Patient taking differently: Take 1 tablet by mouth at bedtime.  08/11/19   [name], PA-C  silver sulfADIAZINE (SILVADENE) 1 % cream Apply 1 application topically daily. Apply to right first toe Patient taking differently: Apply 1 application topically daily as needed (pain).  02/21/19   Kris Hartmann, NP  torsemide (DEMADEX) 20 MG tablet TAKE (2) TABLETS BY MOUTH TWICE DAILY. Patient taking differently: Take 40 mg by mouth 2 (two) times daily.  04/20/19   [name], PA-C  triamcinolone cream (KENALOG) 0.1 % APPLY TO AFFECTED AREA TWICE DAILY 06/17/18   [name], PA-C  valACYclovir (VALTREX) 500 MG tablet TAKE (1) TABLET BY MOUTH EVERY OTHER DAY. 10/06/19   [name], PA-C  gabapentin (NEURONTIN) 100 MG capsule Take 1 capsule (100 mg total) by mouth 3 (three) times daily. 08/19/17 02/03/19  [name], MD    Allergies Shellfish allergy and Diazepam    Social History Social History   Tobacco Use  . Smoking status: Former Smoker    Packs/day: 0.25    Years: 6.00    Pack years: 1.50    Types: Cigarettes    Quit date: 10/25/1980    Years since quitting: 39.1  . Smokeless tobacco: Never Used  Substance Use Topics  . Alcohol use: Yes    Comment: rarely  . Drug use: Yes    Types: Marijuana    Comment: Occasional marijuana use 08/21/2016 "no crack-clean since 05/1998"    Review of Systems Patient denies headaches, rhinorrhea, blurry vision, numbness, shortness of breath, chest pain, edema, cough, abdominal pain, nausea, vomiting, diarrhea, dysuria, fevers, rashes or hallucinations unless otherwise stated above in HPI. ____________________________________________   PHYSICAL  EXAM:  VITAL SIGNS: Vitals:   12/13/19 1243  BP: (!) 149/64  Pulse: 79  Resp: 20  Temp: 98.5 F (36.9 C)  SpO2: 98%    Constitutional: Alert and oriented.  Eyes: Conjunctivae are normal.  Head: Atraumatic. Nose: No congestion/rhinnorhea. Mouth/Throat: Mucous membranes are moist.   Neck: No stridor. Painless ROM.  Cardiovascular: Normal rate, regular rhythm. Grossly normal heart sounds.  Good peripheral circulation. Respiratory: Normal respiratory effort.  No retractions. Lungs CTAB. Gastrointestinal: Soft and nontender. No distention. No abdominal bruits. + Right CVA tenderness. Genitourinary:  Musculoskeletal: No lower extremity tenderness nor edema.  No joint effusions. Neurologic:  Normal speech and language. No gross focal neurologic deficits are appreciated. No facial droop Skin:  Skin is warm, dry and intact. No rash noted. Psychiatric: Mood and affect are normal. Speech and behavior are normal.  ____________________________________________   LABS (all labs ordered are listed, but only abnormal results are displayed)  Results for orders placed or performed during the hospital encounter of 12/13/19 (from the past 24 hour(s))  Urinalysis, Complete w Microscopic     Status: Abnormal   Collection Time: 12/13/19 12:58 PM  Result Value Ref Range   Color, Urine AMBER (A) YELLOW   APPearance CLOUDY (A) CLEAR   Specific Gravity, Urine 1.017 1.005 - 1.030   pH 5.0 5.0 - 8.0   Glucose, UA NEGATIVE NEGATIVE mg/dL   Hgb urine dipstick NEGATIVE NEGATIVE   Bilirubin Urine NEGATIVE NEGATIVE   Ketones, ur NEGATIVE NEGATIVE mg/dL   Protein, ur 30 (A) NEGATIVE mg/dL   Nitrite NEGATIVE NEGATIVE   Leukocytes,Ua LARGE (A) NEGATIVE   RBC / HPF 11-20 0 - 5 RBC/hpf   WBC, UA >50 (H) 0 - 5 WBC/hpf   Bacteria, UA RARE (A) NONE SEEN  Squamous Epithelial / LPF 11-20 0 - 5   Budding Yeast PRESENT    Hyaline Casts, UA PRESENT   Basic metabolic panel     Status: Abnormal   Collection  Time: 12/13/19 12:59 PM  Result Value Ref Range   Sodium 137 135 - 145 mmol/L   Potassium 5.4 (H) 3.5 - 5.1 mmol/L   Chloride 98 98 - 111 mmol/L   CO2 25 22 - 32 mmol/L   Glucose, Bld 177 (H) 70 - 99 mg/dL   BUN 54 (H) 8 - 23 mg/dL   Creatinine, Ser 7.17 (H) 0.44 - 1.00 mg/dL   Calcium 8.3 (L) 8.9 - 10.3 mg/dL   GFR calc non Af Amer 6 (L) >60 mL/min   GFR calc Af Amer 7 (L) >60 mL/min   Anion gap 14 5 - 15  CBC     Status: Abnormal   Collection Time: 12/13/19 12:59 PM  Result Value Ref Range   WBC 7.1 4.0 - 10.5 K/uL   RBC 3.64 (L) 3.87 - 5.11 MIL/uL   Hemoglobin 11.6 (L) 12.0 - 15.0 g/dL   HCT 35.4 (L) 36.0 - 46.0 %   MCV 97.3 80.0 - 100.0 fL   MCH 31.9 26.0 - 34.0 pg   MCHC 32.8 30.0 - 36.0 g/dL   RDW 15.6 (H) 11.5 - 15.5 %   Platelets 204 150 - 400 K/uL   nRBC 0.0 0.0 - 0.2 %   *Note: Due to a large number of results and/or encounters for the requested time period, some results have not been displayed. A complete set of results can be found in Results Review.   ____________________________________________  EKG My review and personal interpretation at Time: 15:33   Indication: flank pain  Rate: 80  Rhythm: sinus Axis: normal Other: normal intervals, no stemi ____________________________________________  RADIOLOGY  I personally reviewed all radiographic images ordered to evaluate for the above acute complaints and reviewed radiology reports and findings.  These findings were personally discussed with the patient.  Please see medical record for radiology report.  ____________________________________________   PROCEDURES  Procedure(s) performed:  Procedures    Critical Care performed: no ____________________________________________   INITIAL IMPRESSION / ASSESSMENT AND PLAN / ED COURSE  Pertinent labs & imaging results that were available during my care of the patient were reviewed by me and considered in my medical decision making (see chart for details).     DDX: [place], colitis, diverticulitis, biliary pathology, titration, electrolyte abnormality, dissection  Tammra ELLIS KOFFLER is a 64 y.o. who presents to the ED with his as described above.  Patient nontoxic-appearing.  Has very reproducible right-sided and right flank pain.  Does not seem consistent with dissection, AAA or biliary pathology.  CT imaging without contrast due to patient being ESRD ordered for the above differential.  Clinical Course as of Dec 13 1734  Wed Dec 13, 2019  1547 Work-up thus far is reassuring.  Would like to check urine make sure she not having signs of pyelonephritis but no evidence of acute intra-abdominal process at this point.  Potassium is mildly elevated but does not have any evidence of hyperacute or peaked T waves.  Renal function otherwise at baseline.  Discussed case in consultation with Dr. Holley Raring who recommends 16.8 of [place] and continued outpatient follow-up with dialysis in the AM   [PR]    Clinical Course User Index [PR] Merlyn Lot, MD    The patient was evaluated in Emergency Department today for the symptoms  described in the history of present illness. He/she was evaluated in the context of the global COVID-19 pandemic, which necessitated consideration that the patient might be at risk for infection with the SARS-CoV-2 virus that causes COVID-19. Institutional protocols and algorithms that pertain to the evaluation of patients at risk for COVID-19 are in a state of rapid change based on information released by regulatory bodies including the CDC and federal and state organizations. These policies and algorithms were followed during the patient's care in the ED.  As part of my medical decision making, I reviewed the following data within the [place] notes reviewed and incorporated, Labs reviewed, notes from prior ED visits and [place] Controlled Substance Database   ____________________________________________   FINAL  CLINICAL IMPRESSION(S) / ED DIAGNOSES  Final diagnoses:  Right flank pain      NEW MEDICATIONS STARTED DURING THIS VISIT:  New Prescriptions   CEPHALEXIN (KEFLEX) 250 MG CAPSULE    Take 1 capsule (250 mg total) by mouth daily for 5 days.   HYDROCODONE-ACETAMINOPHEN (NORCO) 5-325 MG TABLET    Take 1 tablet by mouth every 4 (four) hours as needed for moderate pain.     Note:  This document was prepared using Dragon voice recognition software and may include unintentional dictation errors.    Merlyn Lot, MD 12/13/19 1736

## 2019-12-13 NOTE — ED Triage Notes (Signed)
Pt c/o right flank pain for the past 6 weeks and states today she vomited green emesis. denies any urinary sx. Denies any injury

## 2019-12-14 DIAGNOSIS — N186 End stage renal disease: Secondary | ICD-10-CM | POA: Diagnosis not present

## 2019-12-14 DIAGNOSIS — Z992 Dependence on renal dialysis: Secondary | ICD-10-CM | POA: Diagnosis not present

## 2019-12-14 DIAGNOSIS — N2581 Secondary hyperparathyroidism of renal origin: Secondary | ICD-10-CM | POA: Diagnosis not present

## 2019-12-16 DIAGNOSIS — N186 End stage renal disease: Secondary | ICD-10-CM | POA: Diagnosis not present

## 2019-12-16 DIAGNOSIS — Z992 Dependence on renal dialysis: Secondary | ICD-10-CM | POA: Diagnosis not present

## 2019-12-16 DIAGNOSIS — N2581 Secondary hyperparathyroidism of renal origin: Secondary | ICD-10-CM | POA: Diagnosis not present

## 2019-12-19 DIAGNOSIS — N2581 Secondary hyperparathyroidism of renal origin: Secondary | ICD-10-CM | POA: Diagnosis not present

## 2019-12-19 DIAGNOSIS — Z992 Dependence on renal dialysis: Secondary | ICD-10-CM | POA: Diagnosis not present

## 2019-12-19 DIAGNOSIS — N186 End stage renal disease: Secondary | ICD-10-CM | POA: Diagnosis not present

## 2019-12-20 ENCOUNTER — Encounter (INDEPENDENT_AMBULATORY_CARE_PROVIDER_SITE_OTHER): Payer: Medicare HMO

## 2019-12-20 ENCOUNTER — Ambulatory Visit (INDEPENDENT_AMBULATORY_CARE_PROVIDER_SITE_OTHER): Payer: Medicare HMO | Admitting: Nurse Practitioner

## 2019-12-21 DIAGNOSIS — N186 End stage renal disease: Secondary | ICD-10-CM | POA: Diagnosis not present

## 2019-12-21 DIAGNOSIS — Z992 Dependence on renal dialysis: Secondary | ICD-10-CM | POA: Diagnosis not present

## 2019-12-21 DIAGNOSIS — N2581 Secondary hyperparathyroidism of renal origin: Secondary | ICD-10-CM | POA: Diagnosis not present

## 2019-12-23 DIAGNOSIS — N2581 Secondary hyperparathyroidism of renal origin: Secondary | ICD-10-CM | POA: Diagnosis not present

## 2019-12-23 DIAGNOSIS — N186 End stage renal disease: Secondary | ICD-10-CM | POA: Diagnosis not present

## 2019-12-23 DIAGNOSIS — Z992 Dependence on renal dialysis: Secondary | ICD-10-CM | POA: Diagnosis not present

## 2019-12-25 NOTE — Progress Notes (Signed)
Subjective:   [name] is a 64 y.o. female who presents for an Initial Medicare Annual Wellness Visit.  I connected with [name] today by telephone and verified that I am speaking with the correct person using two identifiers. Location patient: home Location provider: work Persons participating in the virtual visit: patient, provider.   I discussed the limitations, risks, security and privacy concerns of performing an evaluation and management service by telephone and the availability of in person appointments. I also discussed with the patient that there may be a patient responsible charge related to this service. The patient expressed understanding and verbally consented to this telephonic visit.    Interactive audio and video telecommunications were attempted between this provider and patient, however failed, due to patient having technical difficulties OR patient did not have access to video capability.  We continued and completed visit with audio only.  Review of Systems    N/A  Cardiac Risk Factors include: diabetes mellitus;dyslipidemia;hypertension     Objective:    Today's Vitals   12/26/19 1522  PainSc: 0-No pain   There is no height or weight on file to calculate BMI.  Advanced Directives 12/26/2019 12/13/2019 12/13/2019 11/01/2019 10/31/2019 09/19/2019 06/29/2019  Does Patient Have a Medical Advance Directive? _0  No No  Would patient like information on creating a medical advance directive? No - Patient declined No - Patient declined No - Patient declined No - Patient declined No - Patient declined Yes (MAU/Ambulatory/Procedural Areas - Information given) -  Pre-existing out of facility DNR order (yellow form or pink MOST form) - - - - - - -    Current Medications (verified) Outpatient Encounter Medications as of 12/26/2019  Medication Sig  . albuterol (VENTOLIN HFA) 108 (90 Base) MCG/ACT inhaler Inhale 2 puffs into the lungs every 4 (four) hours as  needed for wheezing or shortness of breath.  . allopurinol (ZYLOPRIM) 100 MG tablet TAKE 1 TABLET BY MOUTH ONCE DAILY.  [name] aspirin EC 81 MG EC tablet Take 1 tablet (81 mg total) by mouth daily.  [name] atorvastatin (LIPITOR) 80 MG tablet TAKE 1 TABLET BY MOUTH ONCE DAILY AT 6PM  . AURYXIA 1 GM 210 MG(Fe) tablet Take 420 mg by mouth 3 (three) times daily with meals.   . carvedilol (COREG) 25 MG tablet TAKE (1) TABLET BY MOUTH TWICE A DAY WITH MEALS (BREAKFAST AND SUPPER)  . cyclobenzaprine (FLEXERIL) 5 MG tablet Take 1 tablet (5 mg total) by mouth 3 (three) times daily as needed for muscle spasms.  [name] dicyclomine (BENTYL) 20 MG tablet TAKE 1 TABLET(20 MG) BY MOUTH FOUR TIMES DAILY BEFORE MEALS AND AT BEDTIME  . doxepin (SINEQUAN) 10 MG capsule Take 1 capsule (10 mg total) by mouth 4 (four) times daily as needed. For itching  . DULERA 200-5 MCG/ACT AERO Inhale 2 puffs into the lungs 2 (two) times daily.  . fluticasone (FLONASE) 50 MCG/ACT nasal spray Place 2 sprays into both nostrils daily as needed for allergies or rhinitis.  . fluticasone furoate-vilanterol (BREO ELLIPTA) 200-25 MCG/INH AEPB Inhale 1 puff into the lungs daily.  . hydrALAZINE (APRESOLINE) 100 MG tablet TAKE (1) TABLET BY MOUTH THREE TIMES DAILY (Patient taking differently: Take 100 mg by mouth 3 (three) times daily. )  . HYDROcodone-acetaminophen (NORCO) 5-325 MG tablet Take 1 tablet by mouth every 4 (four) hours as needed for moderate pain.  [name] LEVEMIR FLEXTOUCH 100 UNIT/ML FlexPen INJECT 60 UNITS UNDER THE SKIN TWICE DAILY  .  lidocaine-prilocaine (EMLA) cream Apply 1 application topically as needed.  . liraglutide (VICTOZA) 18 MG/3ML SOPN Start with 0.27m daily and increase by 0.615mper week until max dose of 1.8 mg dose achieved. (Patient taking differently: Inject 0.6-1.8 mg into the skin See admin instructions. Start with 0.107m39maily and increase by 0.8mg41mr week until max dose of 1.2mg 55me achieved.)  . LOPERAMIDE HCL PO Take 1  tablet by mouth as needed.   . loratadine (CLARITIN) 10 MG tablet TAKE 1 TABLET BY MOUTH ONCE DAILY. (Patient taking differently: Take 10 mg by mouth at bedtime. )  . mometasone (ELOCON) 0.1 % cream APPLY AS DIRECTED TO AFFECTED AREA ONCE DAILY. (Patient taking differently: Apply 1 application topically daily as needed. )  . montelukast (SINGULAIR) 10 MG tablet TAKE ONE TABLET BY MOUTH AT BEDTIME. (Patient taking differently: Take 10 mg by mouth at bedtime. )  . nitroGLYCERIN (NITROSTAT) 0.4 MG SL tablet Place 1 tablet (0.4 mg total) under the tongue every 5 (five) minutes x 3 doses as needed for chest pain.  . NOVMarland KitchenLOG FLEXPEN 100 UNIT/ML FlexPen USE 25 UNITS UNDER THE SKIN THREE TIMES DAILY WITH MEALS (Patient taking differently: Inject 25 Units into the skin 3 (three) times daily with meals. )  . nystatin cream (MYCOSTATIN) Apply 1 application topically 2 (two) times daily. (Patient taking differently: Apply 1 application topically daily as needed for dry skin. )  . omeprazole (PRILOSEC) 20 MG capsule TAKE (1) CAPSULE BY MOUTH ONCE DAILY. (Patient taking differently: Take 20 mg by mouth every evening. )  . pregabalin (LYRICA) 150 MG capsule Take 1 capsule (150 mg total) by mouth 2 (two) times daily.  . SENNA PLUS 8.6-50 MG tablet TAKE ONE TABLET BY MOUTH AT BEDTIME. (Patient taking differently: Take 1 tablet by mouth at bedtime as needed. )  . silver sulfADIAZINE (SILVADENE) 1 % cream Apply 1 application topically daily. Apply to right first toe (Patient taking differently: Apply 1 application topically daily as needed (pain). )  . torsemide (DEMADEX) 20 MG tablet TAKE (2) TABLETS BY MOUTH TWICE DAILY. (Patient taking differently: Take 40 mg by mouth 2 (two) times daily. )  . traMADol (ULTRAM) 50 MG tablet Take 50 mg by mouth 2 (two) times daily as needed.  . triamcinolone cream (KENALOG) 0.1 % APPLY TO AFFECTED AREA TWICE DAILY  . valACYclovir (VALTREX) 500 MG tablet TAKE (1) TABLET BY MOUTH EVERY  OTHER DAY.  . VitMarland Kitchenmin D, Ergocalciferol, (DRISDOL) 1.25 MG (50000 UT) CAPS capsule TAKE 1 CAPSULE BY MOUTH ONCE A MONTH (Patient taking differently: Take 50,000 Units by mouth every 30 (thirty) days. )  . b complex-vitamin c-folic acid (NEPHRO-VITE) 0.8 MG TABS tablet Take 1 tablet by mouth daily. (Patient not taking: Reported on 12/26/2019)  . docusate sodium (COLACE) 100 MG capsule Take 1 capsule (100 mg total) by mouth 2 (two) times daily as needed for mild constipation. (Patient not taking: Reported on 12/26/2019)  . gentamicin cream (GARAMYCIN) 0.1 % Apply 1 application topically 2 (two) times daily. (Patient not taking: Reported on 12/26/2019)  . HYDROcodone-acetaminophen (NORCO/VICODIN) 5-325 MG tablet Take 1 tablet by mouth every 6 (six) hours as needed for moderate pain. (Patient not taking: Reported on 12/26/2019)  . oxyCODONE-acetaminophen (PERCOCET/ROXICET) 5-325 MG tablet Take 1 tablet by mouth every 8 (eight) hours as needed for severe pain. (Patient not taking: Reported on 12/26/2019)  . [DISCONTINUED] gabapentin (NEURONTIN) 100 MG capsule Take 1 capsule (100 mg total) by mouth 3 (three) times daily.  No facility-administered encounter medications on file as of 12/26/2019.    Allergies (verified) Shellfish allergy and Diazepam   History: Past Medical History:  Diagnosis Date  . Anemia   . Aortic stenosis    Echo 8/18: mean 13, peak 28, LVOT/AV mean velocity 0.51  . Arthritis   . Asthma    As a child   . Bronchitis   . CAD (coronary artery disease)    a. 09/2016: 50% Ost 1st Mrg stenosis, 50% 2nd Mrg stenosis, 20% Mid-Cx, 95% Prox LAD, 40% mid-LAD, and 10% dist-LAD stenosis. Staged PCI with DES to Prox-LAD.   [name] Chronic combined systolic and diastolic CHF (congestive heart failure) ([place]) 2011   echo 2/18: EF 55-60, normal wall motion, grade 2 diastolic dysfunction, trivial AI // echo 3/18: Septal and apical HK, EF 45-50, normal wall motion, trivial AI, mild LAE, PASP 38 // echo  8/18: EF 60-65, normal wall motion, grade 1 diastolic dysfunction, calcified aortic valve leaflets, mild aortic stenosis (mean 13, peak 28, LVOT/AV mean velocity 0.51), mild AI, moderate MAC, mild LAE, trivial TR   . Chronic kidney disease    STAGE 4  . Chronic kidney disease on chronic dialysis (HCC)    t, th, sat  . Complication of anesthesia   . Depression   . Diabetes mellitus Dx 1989  . Elevated lipids   . GERD (gastroesophageal reflux disease)   . Gout   . Heart murmur    asymptomatic  . Hepatitis C Dx 2013  . Hypertension Dx 1989  . Infected surgical wound    Lt arm  . Myocardial infarction ([place]) 07/2015  . Obesity   . Pancreatitis 2013  . Pneumonia   . Refusal of blood transfusions as patient is Jehovah's Witness   . Tendinitis   . Tremors of nervous system    LEFT HAND  . Ulcer 2010   Past Surgical History:  Procedure Laterality Date  . A/V FISTULAGRAM Left 04/11/2019   Procedure: A/V FISTULAGRAM;  Surgeon: Katha Cabal, MD;  Location: [place] [facility];  Service: Cardiovascular;  Laterality: Left;  . A/V FISTULAGRAM Left 06/02/2019   Procedure: A/V FISTULAGRAM;  Surgeon: Katha Cabal, MD;  Location: [place] [facility];  Service: Cardiovascular;  Laterality: Left;  . APPLICATION OF WOUND VAC Left 08/14/2017   Procedure: APPLICATION OF WOUND VAC Exchange;  Surgeon: Robert Bellow, MD;  Location: [facility];  Service: General;  Laterality: Left;  . APPLICATION OF WOUND VAC Left 12/21/2018   Procedure: APPLICATION OF WOUND VAC;  Surgeon: Katha Cabal, MD;  Location: [facility];  Service: Vascular;  Laterality: Left;  . AV FISTULA PLACEMENT Left 08/19/2018   Procedure: ARTERIOVENOUS (AV) FISTULA CREATION ( BRACHIOBASILIC );  Surgeon: Katha Cabal, MD;  Location: [facility];  Service: Vascular;  Laterality: Left;  . BASCILIC VEIN TRANSPOSITION Left 11/18/2018   Procedure: BASCILIC VEIN TRANSPOSITION;  Surgeon: Katha Cabal, MD;  Location: [facility];   Service: Vascular;  Laterality: Left;  . CHOLECYSTECTOMY    . COLONOSCOPY WITH PROPOFOL N/A 02/03/2018   Procedure: COLONOSCOPY WITH PROPOFOL;  Surgeon: Lin Landsman, MD;  Location: [facility];  Service: Gastroenterology;  Laterality: N/A;  . CORONARY ANGIOPLASTY  07/2015   STENT  . CORONARY STENT INTERVENTION N/A 09/18/2016   Procedure: Coronary Stent Intervention;  Surgeon: Troy Sine, MD;  Location: [place] [facility];  Service: Cardiovascular;  Laterality: N/A;  . DIALYSIS/PERMA CATHETER INSERTION N/A 05/10/2018   Procedure: DIALYSIS/PERMA  CATHETER INSERTION;  Surgeon: Katha Cabal, MD;  Location: [place] [facility];  Service: Cardiovascular;  Laterality: N/A;  . DRESSING CHANGE UNDER ANESTHESIA Left 08/15/2017   Procedure: exploration of wound for bleeding;  Surgeon: Robert Bellow, MD;  Location: [facility];  Service: General;  Laterality: Left;  . ESOPHAGOGASTRODUODENOSCOPY (EGD) WITH PROPOFOL N/A 02/03/2018   Procedure: ESOPHAGOGASTRODUODENOSCOPY (EGD) WITH PROPOFOL;  Surgeon: Lin Landsman, MD;  Location: [facility];  Service: Gastroenterology;  Laterality: N/A;  . EYE SURGERY  11/17/2018  . INCISION AND DRAINAGE ABSCESS Left 08/12/2017   Procedure: INCISION AND DRAINAGE ABSCESS;  Surgeon: Robert Bellow, MD;  Location: [facility];  Service: General;  Laterality: Left;  . KNEE ARTHROSCOPY    . LEFT HEART CATH N/A 09/18/2016   Procedure: Left Heart Cath;  Surgeon: Troy Sine, MD;  Location: [place] [facility];  Service: Cardiovascular;  Laterality: N/A;  . LEFT HEART CATH AND CORONARY ANGIOGRAPHY N/A 09/16/2016   Procedure: Left Heart Cath and Coronary Angiography;  Surgeon: Burnell Blanks, MD;  Location: [place] [facility];  Service: Cardiovascular;  Laterality: N/A;  . LEFT HEART CATH AND CORONARY ANGIOGRAPHY N/A 04/29/2017   Procedure: LEFT HEART CATH AND CORONARY ANGIOGRAPHY;  Surgeon: Nelva Bush, MD;  Location: [place] [facility];   Service: Cardiovascular;  Laterality: N/A;  . LOWER EXTREMITY ANGIOGRAPHY Right 03/08/2018   Procedure: LOWER EXTREMITY ANGIOGRAPHY;  Surgeon: Katha Cabal, MD;  Location: [place] [facility];  Service: Cardiovascular;  Laterality: Right;  . TUBAL LIGATION    . TUBAL LIGATION    . UPPER EXTREMITY ANGIOGRAPHY Right 09/19/2019   Procedure: UPPER EXTREMITY ANGIOGRAPHY;  Surgeon: Katha Cabal, MD;  Location: [place] [facility];  Service: Cardiovascular;  Laterality: Right;  . WOUND DEBRIDEMENT Left 12/21/2018   Procedure: DEBRIDEMENT WOUND;  Surgeon: Katha Cabal, MD;  Location: [facility];  Service: Vascular;  Laterality: Left;  . WOUND DEBRIDEMENT Left 12/30/2018   Procedure: DEBRIDEMENT WOUND WITH VAC PLACEMENT (LEFT UPPER EXTREMITY);  Surgeon: Katha Cabal, MD;  Location: [facility];  Service: Vascular;  Laterality: Left;   Family History  Problem Relation Age of Onset  . Colon cancer Mother   . Heart attack Other   . Heart attack Maternal Grandmother   . Hypertension Sister   . Hypertension Brother   . Diabetes Paternal Grandmother   . Breast cancer Neg Hx    Social History   Socioeconomic History  . Marital status: Divorced    Spouse name: Not on file  . Number of children: 3  . Years of education: Not on file  . Highest education level: Bachelor's degree (e.g., BA, AB, BS)  Occupational History  . Occupation: disability  Tobacco Use  . Smoking status: Former Smoker    Packs/day: 0.25    Years: 6.00    Pack years: 1.50    Types: Cigarettes    Quit date: 10/25/1980    Years since quitting: 39.1  . Smokeless tobacco: Never Used  Vaping Use  . Vaping Use: Never used  Substance and Sexual Activity  . Alcohol use: Not Currently  . Drug use: Yes    Types: Marijuana    Comment: Occasional marijuana use 08/21/2016 "no crack-clean since 05/1998"  . Sexual activity: Not on file    Comment: Not asked  Other Topics Concern  . Not on file  Social History  Narrative  . Not on file   Social Determinants of Health  Financial Resource Strain: Low Risk   . Difficulty of Paying Living Expenses: Not hard at all  Food Insecurity: No Food Insecurity  . Worried About Charity fundraiser in the Last Year: Never true  . Ran Out of Food in the Last Year: Never true  Transportation Needs: No Transportation Needs  . Lack of Transportation (Medical): No  . Lack of Transportation (Non-Medical): No  Physical Activity: Inactive  . Days of Exercise per Week: 0 days  . Minutes of Exercise per Session: 0 min  Stress: Stress Concern Present  . Feeling of Stress : To some extent  Social Connections: Socially Isolated  . Frequency of Communication with Friends and Family: Twice a week  . Frequency of Social Gatherings with Friends and Family: Never  . Attends Religious Services: 1 to 4 times per year  . Active Member of Clubs or Organizations: No  . Attends Archivist Meetings: Never  . Marital Status: Divorced    Tobacco Counseling Counseling given: Not Answered   Clinical Intake:  Pre-visit preparation completed: Yes  Pain : No/denies pain Pain [name]: 0-No pain     Nutritional Risks: None Diabetes: Yes  How often do you need to have someone help you when you read instructions, pamphlets, or other written materials from your doctor or pharmacy?: 1 - Never   Diabetes:  Is the patient diabetic?  Yes  If diabetic, was a CBG obtained today?  No  Did the patient bring in their glucometer from home?  No  How often do you monitor your CBG's? Once every other day.   Financial Strains and Diabetes Management:  Are you having any financial strains with the device, your supplies or your medication? No .  Does the patient want to be seen by Chronic Care Management for management of their diabetes?  No  Would the patient like to be referred to a Nutritionist or for Diabetic Management? Yes, referral sent for nutritionist.  Diabetic  Exams:  Diabetic Eye Exam: Completed 10/14/17. Overdue for diabetic eye exam. Pt has been advised about the importance in completing this exam. Pt plans to schedule an eye exam this year.   Diabetic Foot Exam: Completed 11/06/19. Repeat yearly.  Interpreter Needed?: No  Information entered by :: [facility], LPN   Activities of Daily Living In your present state of health, do you have any difficulty performing the following activities: 12/26/2019 11/01/2019  Hearing? N N  Vision? Y N  Comment Has blurred vision. Plans to schedule an eye exam this year. -  Difficulty concentrating or making decisions? N N  Walking or climbing stairs? Y Y  Comment Due to SOB and tiredness. -  Dressing or bathing? N N  Doing errands, shopping? N N  Preparing Food and eating ? N -  Using the Toilet? N -  In the past six months, have you accidently leaked urine? N -  Do you have problems with loss of bowel control? N -  Managing your Medications? N -  Managing your Finances? N -  Housekeeping or managing your Housekeeping? N -  Some recent data might be hidden     Immunizations and Health Maintenance Immunization History  Administered Date(s) Administered  . Hepatitis B, adult 06/30/2018, 07/28/2018, 09/01/2018  . Influenza Split 03/27/2012  . Influenza,inj,Quad PF,6+ Mos 10/03/2014, 04/08/2015, 05/19/2016, 04/29/2018, 04/06/2019  . Pneumococcal Conjugate-13 07/07/2018  . Pneumococcal Polysaccharide-23 03/26/2012, 09/20/2018  . Tdap 06/18/2014   Health Maintenance Due  Topic Date Due  .  COVID-19 Vaccine (1) Never done  . OPHTHALMOLOGY EXAM  10/15/2018  . MAMMOGRAM  04/16/2019  . HEMOGLOBIN A1C  06/29/2019    Patient Care Team: [name], PA-C as PCP - General (Family Medicine) Sherren Mocha, MD as PCP - Cardiology (Cardiology) Edrick Kins, DPM as Consulting Physician (Podiatry)  Indicate any recent Medical Services you may have received from other than [facility] providers in the  past year (date may be approximate).     Assessment:   This is a routine wellness examination for [name].  Hearing/Vision screen  Hearing Screening   _0  _1  _2  _3  _4  _5  _6  _7  _8   Right ear:           Left ear:           Comments: No trouble hearing. Does not wear hearing aids.   Vision Screening Comments: Pt is due for an eye exam. Pt plans to schedule and exam this year with Dr. Susanne Greenhouse.  Dietary issues and exercise activities discussed: Current Exercise Habits: The patient does not participate in regular exercise at present, Exercise limited by: None identified  Goals    . Blood Pressure < 140/90    . Exercise 3x per week (30 min per time)     Recommend to exercise for 3 days a week for at least 30 minutes at a time.     [name] HEMOGLOBIN A1C < 7.0    . Record weight daily      Depression Screen PHQ 2/9 Scores 12/26/2019 11/23/2017 10/14/2017 09/30/2017 07/15/2017 06/15/2017 05/28/2017  PHQ - 2 [name] 1 1 0 _9 PHQ- 9 [name] - - - - _10 Fall Risk Fall Risk  12/26/2019 08/10/2019 11/23/2017 10/14/2017 09/30/2017  Falls in the past year? 1 1 No No No  Number falls in past yr: 1 1 - - -  Injury with Fall? 0 1 - - -  Risk for fall due to : Impaired balance/gait;Impaired vision - - - -  Follow up Falls prevention discussed - - - -    FALL RISK PREVENTION PERTAINING TO THE HOME:  Any stairs in or around the home? No  If so, are there any without handrails? N/A  Home free of loose throw rugs in walkways, pet beds, electrical cords, etc? Yes  Adequate lighting in your home to reduce risk of falls? Yes   ASSISTIVE DEVICES UTILIZED TO PREVENT FALLS:  Life alert? No  Use of a cane, walker or w/c? No  Grab bars in the bathroom? Yes  Shower chair or bench in shower? No  Elevated toilet seat or a handicapped toilet? No    TIMED UP AND GO:  Was the test performed? No .     Cognitive Function: Declined today.   MMSE - Mini Mental State Exam  09/01/2017  Orientation to time 5  Orientation to Place 5  Registration 3  Attention/ Calculation 5  Recall 2  Language- name 2 objects 2  Language- repeat 1  Language- follow 3 step command 3  Language- read & follow direction 1  Write a sentence 1  Copy design 1  Total [name] 29        Screening Tests Health Maintenance  Topic Date Due  . COVID-19 Vaccine (1) Never done  . OPHTHALMOLOGY EXAM  10/15/2018  . MAMMOGRAM  04/16/2019  . HEMOGLOBIN A1C  06/29/2019  . INFLUENZA VACCINE  02/11/2020  . PAP SMEAR-Modifier  10/28/2020  .  FOOT EXAM  11/05/2020  . COLONOSCOPY  02/04/2023  . TETANUS/TDAP  06/18/2024  . PNEUMOCOCCAL POLYSACCHARIDE VACCINE AGE 10-64 HIGH RISK  Completed  . Hepatitis C Screening  Completed  . HIV Screening  Completed    Qualifies for Shingles Vaccine? Yes . Due for Shingrix. Pt has been advised to call insurance company to determine out of pocket expense. Advised may also receive vaccine at local pharmacy or Health Dept. Verbalized acceptance and understanding.  Tdap: Up to date  Flu Vaccine: Up to date  Cancer Screenings:  Colorectal Screening: Completed 02/03/18. Repeat every 5 years.   Mammogram: Completed 04/15/17. Repeat every 1-2 years as advised. Ordered 11/17/19. Pt advised to call to schedule appt.   Lung Cancer Screening: (Low Dose CT Chest recommended if Age 22-80 years, 30 pack-year currently smoking OR have quit w/in 15years.) does not qualify.   Additional Screening:  Hepatitis C Screening: Up to date   Dental Screening: Recommended annual dental exams for proper oral hygiene  Community Resource Referral:  CRR required this visit? Yes, referral sent to a licensed social worker and pharmacist to aid with living arrangements and medication management.      Plan:  I have personally reviewed and addressed the Medicare Annual Wellness questionnaire and have noted the following in the patient's chart:  A. Medical and social  history B. Use of alcohol, tobacco or illicit drugs  C. Current medications and supplements D. Functional ability and status E.  Nutritional status F.  Physical activity G. Advance directives H. List of other physicians I.  Hospitalizations, surgeries, and ER visits in previous 12 months J.  Blades such as hearing and vision if needed, cognitive and depression L. Referrals and appointments   In addition, I have reviewed and discussed with patient certain preventive protocols, quality metrics, and best practice recommendations. A written personalized care plan for preventive services as well as general preventive health recommendations were provided to patient.   [name], [place]   [id number]  Nurse Health Advisor    Nurse Notes: Pt needs her Hgb A1c checked at her next in office apt. Pt plans to set up an eye exam this year and plans to call and set up her mammogram too.

## 2019-12-26 ENCOUNTER — Other Ambulatory Visit: Payer: Self-pay

## 2019-12-26 ENCOUNTER — Ambulatory Visit (INDEPENDENT_AMBULATORY_CARE_PROVIDER_SITE_OTHER): Payer: Medicare HMO

## 2019-12-26 DIAGNOSIS — Z0000 Encounter for general adult medical examination without abnormal findings: Secondary | ICD-10-CM

## 2019-12-26 DIAGNOSIS — Z794 Long term (current) use of insulin: Secondary | ICD-10-CM | POA: Diagnosis not present

## 2019-12-26 DIAGNOSIS — Z992 Dependence on renal dialysis: Secondary | ICD-10-CM

## 2019-12-26 DIAGNOSIS — N186 End stage renal disease: Secondary | ICD-10-CM

## 2019-12-26 DIAGNOSIS — E1165 Type 2 diabetes mellitus with hyperglycemia: Secondary | ICD-10-CM | POA: Diagnosis not present

## 2019-12-26 DIAGNOSIS — E1142 Type 2 diabetes mellitus with diabetic polyneuropathy: Secondary | ICD-10-CM | POA: Diagnosis not present

## 2019-12-26 DIAGNOSIS — E118 Type 2 diabetes mellitus with unspecified complications: Secondary | ICD-10-CM

## 2019-12-26 DIAGNOSIS — N2581 Secondary hyperparathyroidism of renal origin: Secondary | ICD-10-CM | POA: Diagnosis not present

## 2019-12-26 DIAGNOSIS — IMO0002 Reserved for concepts with insufficient information to code with codable children: Secondary | ICD-10-CM

## 2019-12-26 DIAGNOSIS — E1140 Type 2 diabetes mellitus with diabetic neuropathy, unspecified: Secondary | ICD-10-CM

## 2019-12-26 NOTE — Patient Instructions (Signed)
[name] , Thank you for taking time to come for your Medicare Wellness Visit. I appreciate your ongoing commitment to your health goals. Please review the following plan we discussed and let me know if I can assist you in the future.   Screening recommendations/referrals: Colonoscopy: Up to date, due 01/2023 Mammogram: Currently due, ordered 11/2019. Pt to call and set up apt. Recommended yearly ophthalmology/optometry visit for glaucoma screening and checkup Recommended yearly dental visit for hygiene and checkup  Vaccinations: Influenza vaccine: Up to date Tdap vaccine: Up to date, due 06/2024 Shingles vaccine: Pt declines today.     Advanced directives: Advance directive discussed with you today. Even though you declined this today please call our office should you change your mind and we can give you the proper paperwork for you to fill out.  Conditions/risks identified: Fall risk prevention discussed today. Recommend to start exercising 3 days a week for 30 minutes at a time.  Next appointment: 02/05/20 @ 9:40 AM with [name]. Declined scheduling an AWV for 2022 at this time.   Preventive Care 40-64 Years, Female Preventive care refers to lifestyle choices and visits with your health care provider that can promote health and wellness. What does preventive care include?  A yearly physical exam. This is also called an annual well check.  Dental exams once or twice a year.  Routine eye exams. Ask your health care provider how often you should have your eyes checked.  Personal lifestyle choices, including:  Daily care of your teeth and gums.  Regular physical activity.  Eating a healthy diet.  Avoiding tobacco and drug use.  Limiting alcohol use.  Practicing safe sex.  Taking low-dose aspirin daily starting at age 37.  Taking vitamin and mineral supplements as recommended by your health care provider. What happens during an annual well check? The services and  screenings done by your health care provider during your annual well check will depend on your age, overall health, lifestyle risk factors, and family history of disease. Counseling  Your health care provider may ask you questions about your:  Alcohol use.  Tobacco use.  Drug use.  Emotional well-being.  Home and relationship well-being.  Sexual activity.  Eating habits.  Work and work Statistician.  Method of birth control.  Menstrual cycle.  Pregnancy history. Screening  You may have the following tests or measurements:  Height, weight, and BMI.  Blood pressure.  Lipid and cholesterol levels. These may be checked every 5 years, or more frequently if you are over 79 years old.  Skin check.  Lung cancer screening. You may have this screening every year starting at age 57 if you have a 30-pack-year history of smoking and currently smoke or have quit within the past 15 years.  Fecal occult blood test (FOBT) of the stool. You may have this test every year starting at age 47.  Flexible sigmoidoscopy or colonoscopy. You may have a sigmoidoscopy every 5 years or a colonoscopy every 10 years starting at age 76.  Hepatitis C blood test.  Hepatitis B blood test.  Sexually transmitted disease (STD) testing.  Diabetes screening. This is done by checking your blood sugar (glucose) after you have not eaten for a while (fasting). You may have this done every 1-3 years.  Mammogram. This may be done every 1-2 years. Talk to your health care provider about when you should start having regular mammograms. This may depend on whether you have a family history of breast cancer.  BRCA-related cancer screening. This may be done if you have a family history of breast, ovarian, tubal, or peritoneal cancers.  Pelvic exam and Pap test. This may be done every 3 years starting at age 57. Starting at age 67, this may be done every 5 years if you have a Pap test in combination with an HPV  test.  Bone density scan. This is done to screen for osteoporosis. You may have this scan if you are at high risk for osteoporosis. Discuss your test results, treatment options, and if necessary, the need for more tests with your health care provider. Vaccines  Your health care provider may recommend certain vaccines, such as:  Influenza vaccine. This is recommended every year.  Tetanus, diphtheria, and acellular pertussis (Tdap, Td) vaccine. You may need a Td booster every 10 years.  Zoster vaccine. You may need this after age 96.  Pneumococcal 13-valent conjugate (PCV13) vaccine. You may need this if you have certain conditions and were not previously vaccinated.  Pneumococcal polysaccharide (PPSV23) vaccine. You may need one or two doses if you smoke cigarettes or if you have certain conditions. Talk to your health care provider about which screenings and vaccines you need and how often you need them. This information is not intended to replace advice given to you by your health care provider. Make sure you discuss any questions you have with your health care provider. Document Released: 07/26/2015 Document Revised: 03/18/2016 Document Reviewed: 04/30/2015 Elsevier Interactive Patient Education  2017 [place] Prevention in the Home Falls can cause injuries. They can happen to people of all ages. There are many things you can do to make your home safe and to help prevent falls. What can I do on the outside of my home?  Regularly fix the edges of walkways and driveways and fix any cracks.  Remove anything that might make you trip as you walk through a door, such as a raised step or threshold.  Trim any bushes or trees on the path to your home.  Use bright outdoor lighting.  Clear any walking paths of anything that might make someone trip, such as rocks or tools.  Regularly check to see if handrails are loose or broken. Make sure that both sides of any steps have  handrails.  Any raised decks and porches should have guardrails on the edges.  Have any leaves, snow, or ice cleared regularly.  Use sand or salt on walking paths during winter.  Clean up any spills in your garage right away. This includes oil or grease spills. What can I do in the bathroom?  Use night lights.  Install grab bars by the toilet and in the tub and shower. Do not use towel bars as grab bars.  Use non-skid mats or decals in the tub or shower.  If you need to sit down in the shower, use a plastic, non-slip stool.  Keep the floor dry. Clean up any water that spills on the floor as soon as it happens.  Remove soap buildup in the tub or shower regularly.  Attach bath mats securely with double-sided non-slip rug tape.  Do not have throw rugs and other things on the floor that can make you trip. What can I do in the bedroom?  Use night lights.  Make sure that you have a light by your bed that is easy to reach.  Do not use any sheets or blankets that are too big for your bed. They  should not hang down onto the floor.  Have a firm chair that has side arms. You can use this for support while you get dressed.  Do not have throw rugs and other things on the floor that can make you trip. What can I do in the kitchen?  Clean up any spills right away.  Avoid walking on wet floors.  Keep items that you use a lot in easy-to-reach places.  If you need to reach something above you, use a strong step stool that has a grab bar.  Keep electrical cords out of the way.  Do not use floor polish or wax that makes floors slippery. If you must use wax, use non-skid floor wax.  Do not have throw rugs and other things on the floor that can make you trip. What can I do with my stairs?  Do not leave any items on the stairs.  Make sure that there are handrails on both sides of the stairs and use them. Fix handrails that are broken or loose. Make sure that handrails are as long as  the stairways.  Check any carpeting to make sure that it is firmly attached to the stairs. Fix any carpet that is loose or worn.  Avoid having throw rugs at the top or bottom of the stairs. If you do have throw rugs, attach them to the floor with carpet tape.  Make sure that you have a light switch at the top of the stairs and the bottom of the stairs. If you do not have them, ask someone to add them for you. What else can I do to help prevent falls?  Wear shoes that:  Do not have high heels.  Have rubber bottoms.  Are comfortable and fit you well.  Are closed at the toe. Do not wear sandals.  If you use a stepladder:  Make sure that it is fully opened. Do not climb a closed stepladder.  Make sure that both sides of the stepladder are locked into place.  Ask someone to hold it for you, if possible.  Clearly mark and make sure that you can see:  Any grab bars or handrails.  First and last steps.  Where the edge of each step is.  Use tools that help you move around (mobility aids) if they are needed. These include:  Canes.  Walkers.  Scooters.  Crutches.  Turn on the lights when you go into a dark area. Replace any light bulbs as soon as they burn out.  Set up your furniture so you have a clear path. Avoid moving your furniture around.  If any of your floors are uneven, fix them.  If there are any pets around you, be aware of where they are.  Review your medicines with your doctor. Some medicines can make you feel dizzy. This can increase your chance of falling. Ask your doctor what other things that you can do to help prevent falls. This information is not intended to replace advice given to you by your health care provider. Make sure you discuss any questions you have with your health care provider. Document Released: 04/25/2009 Document Revised: 12/05/2015 Document Reviewed: 08/03/2014 Elsevier Interactive Patient Education  2017 [employer].

## 2019-12-28 DIAGNOSIS — N2581 Secondary hyperparathyroidism of renal origin: Secondary | ICD-10-CM | POA: Diagnosis not present

## 2019-12-28 DIAGNOSIS — N186 End stage renal disease: Secondary | ICD-10-CM | POA: Diagnosis not present

## 2019-12-28 DIAGNOSIS — Z992 Dependence on renal dialysis: Secondary | ICD-10-CM | POA: Diagnosis not present

## 2019-12-29 ENCOUNTER — Telehealth: Payer: Self-pay | Admitting: Physician Assistant

## 2019-12-29 NOTE — Chronic Care Management (AMB) (Signed)
  Chronic Care Management   Note  12/29/2019 Name: JUSTINA BERTINI MRN: [id number] DOB: 09-07-1955  [name] is a 64 y.o. year old female who is a primary care patient of [facility]. I reached out to [name] by phone today in response to a referral sent by Ms. Terrilee Croak Kroboth's PCP, [name] PA-C     Ms. Yankovich was given information about Chronic Care Management services today including:  1. CCM service includes personalized support from designated clinical staff supervised by her physician, including individualized plan of care and coordination with other care providers 2. 24/7 contact phone numbers for assistance for urgent and routine care needs. 3. Service will only be billed when office clinical staff spend 20 minutes or more in a month to coordinate care. 4. Only one practitioner may furnish and bill the service in a calendar month. 5. The patient may stop CCM services at any time (effective at the end of the month) by phone call to the office staff. 6. The patient will be responsible for cost sharing (co-pay) of up to 20% of the service fee (after annual deductible is met).  Patient agreed to services and verbal consent obtained.   Follow up plan: Telephone appointment with care management team member scheduled for:01/18/2020  Glenna Durand, [facility] ??[email or web address] ??[phone]

## 2019-12-30 DIAGNOSIS — N2581 Secondary hyperparathyroidism of renal origin: Secondary | ICD-10-CM | POA: Diagnosis not present

## 2019-12-30 DIAGNOSIS — N186 End stage renal disease: Secondary | ICD-10-CM | POA: Diagnosis not present

## 2019-12-30 DIAGNOSIS — Z992 Dependence on renal dialysis: Secondary | ICD-10-CM | POA: Diagnosis not present

## 2020-01-02 DIAGNOSIS — N186 End stage renal disease: Secondary | ICD-10-CM | POA: Diagnosis not present

## 2020-01-02 DIAGNOSIS — Z992 Dependence on renal dialysis: Secondary | ICD-10-CM | POA: Diagnosis not present

## 2020-01-02 DIAGNOSIS — N2581 Secondary hyperparathyroidism of renal origin: Secondary | ICD-10-CM | POA: Diagnosis not present

## 2020-01-02 IMAGING — DX DG CHEST 1V PORT
1 series · 1 of 1 positions shown · non-contrast
Comparison: 09/04/2017

CLINICAL DATA: Shortness of Breath

EXAM:
PORTABLE CHEST 1 VIEW

[chest ap]
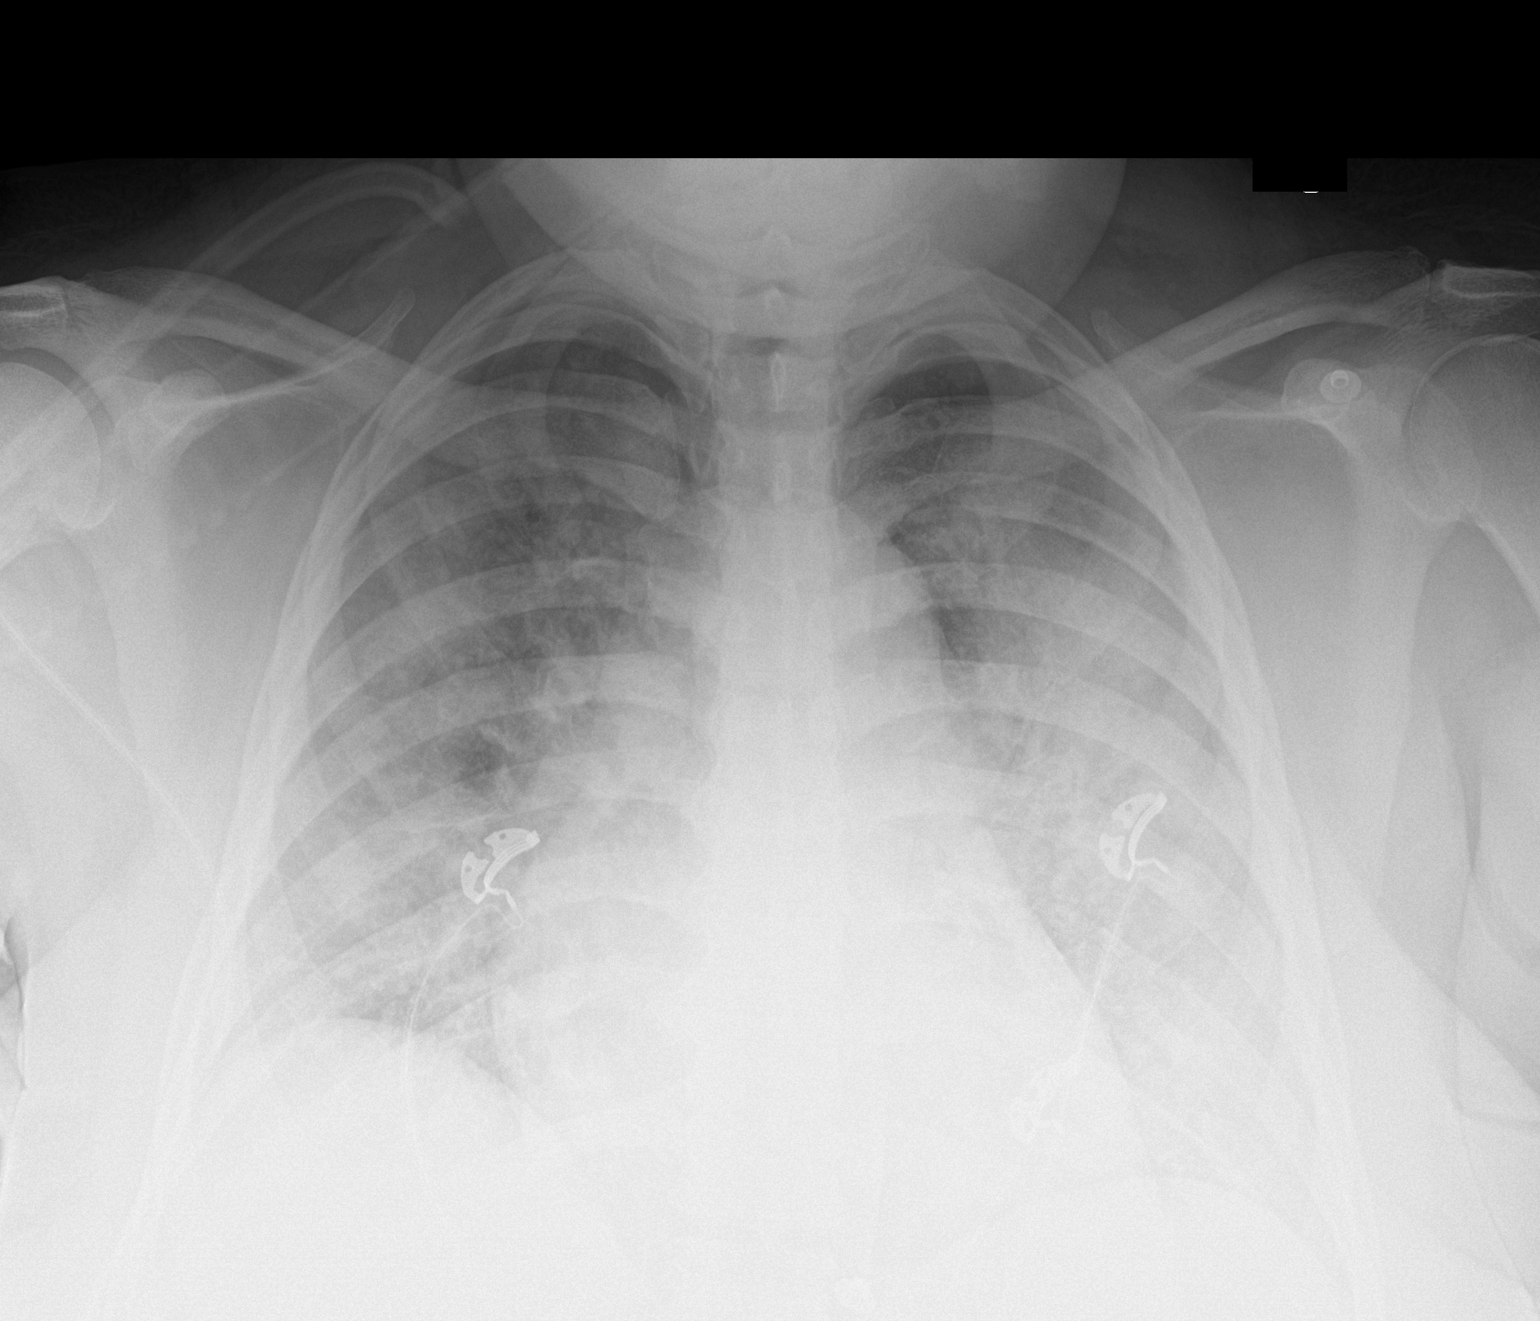

[1 of 1 positions shown; findings below may reference images not displayed]

FINDINGS: Cardiomegaly. Diffuse bilateral airspace opacities are new since
prior study. No visible effusions. No acute bony abnormality.
IMPRESSION: Diffuse bilateral airspace opacities could reflect edema/CHF or
infection.

## 2020-01-03 IMAGING — DX DG CHEST 1V PORT
1 series · 1 of 1 positions shown · non-contrast
Comparison: 09/14/2017, 07/15/2017

CLINICAL DATA: Respiratory failure

EXAM:
PORTABLE CHEST 1 VIEW

[chest ap]
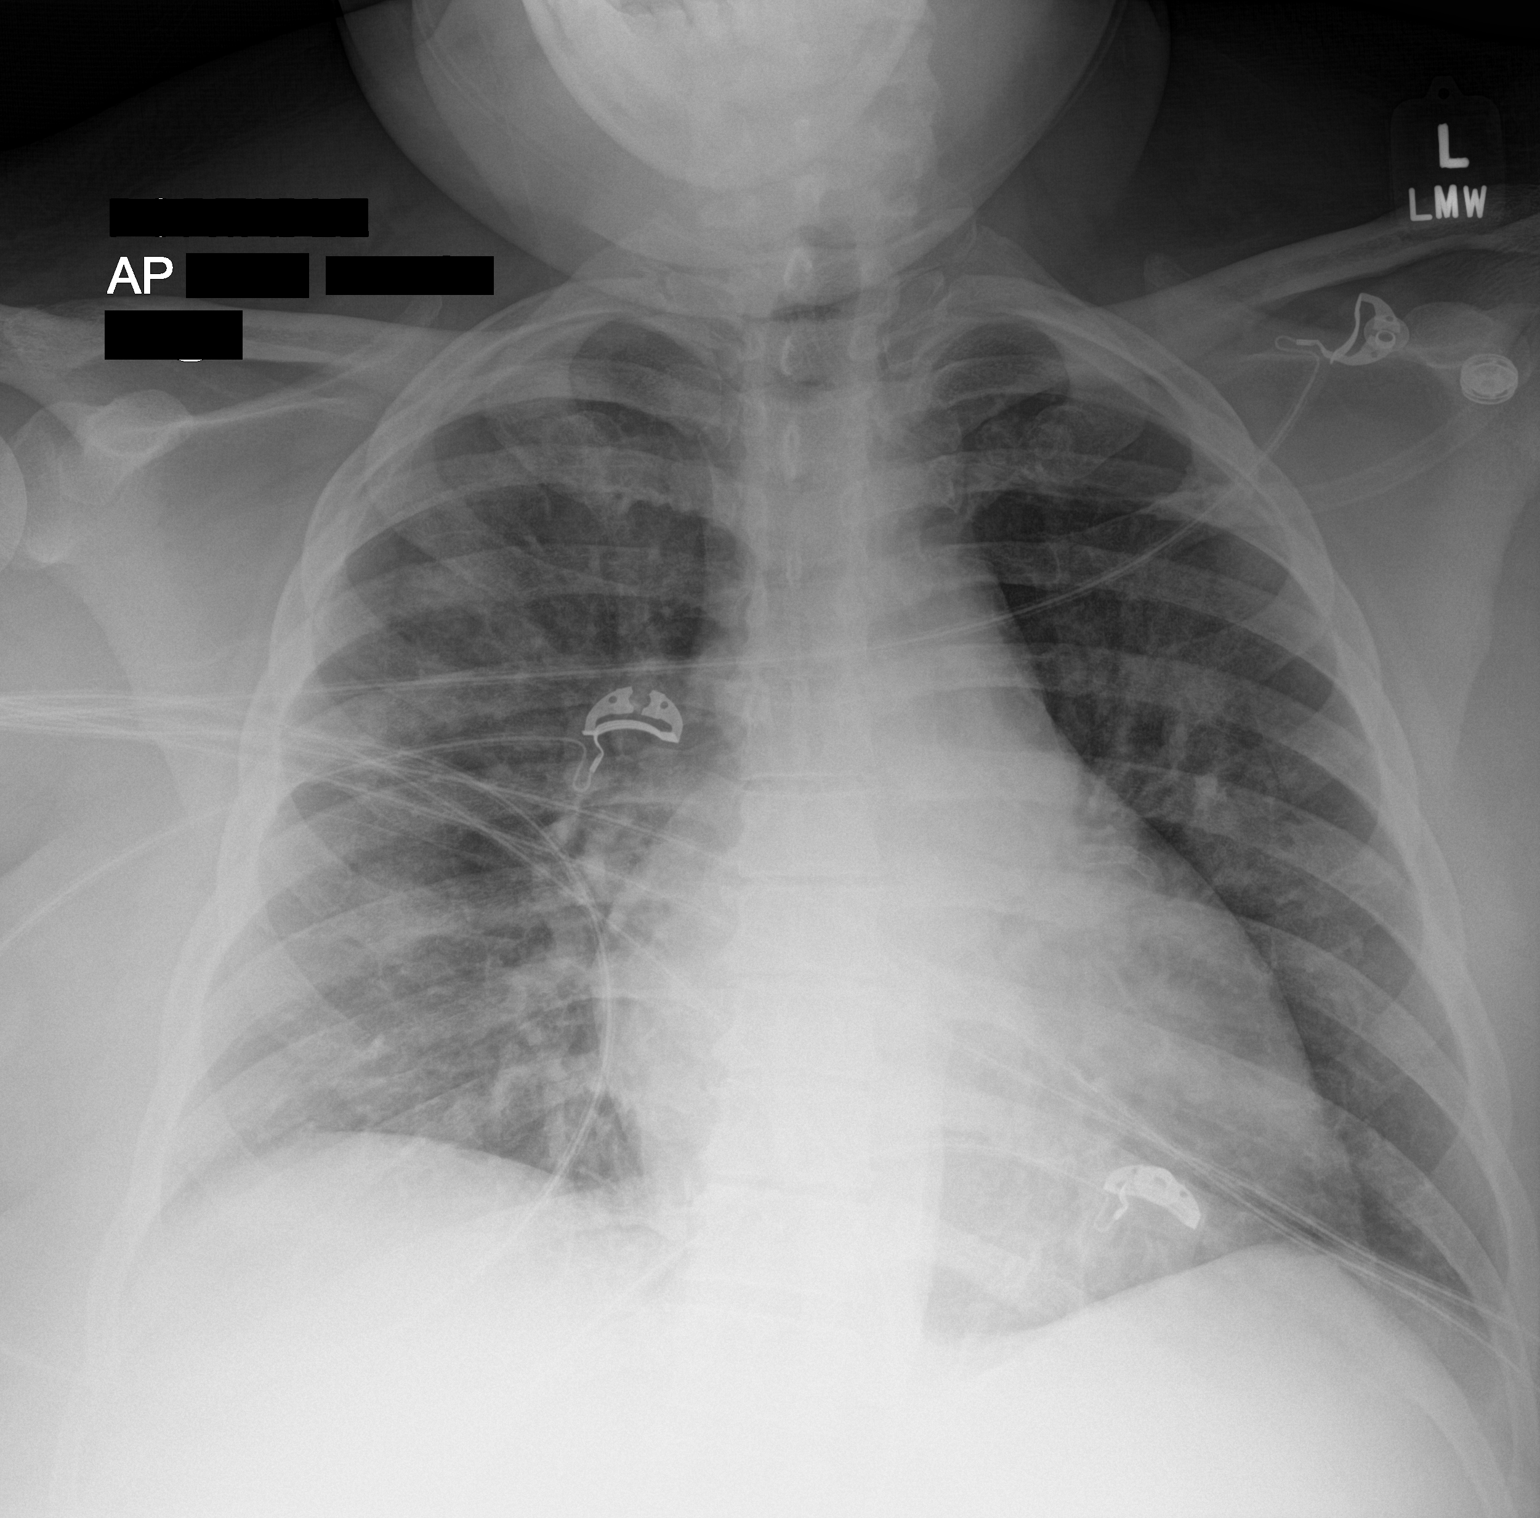

[1 of 1 positions shown; findings below may reference images not displayed]

FINDINGS: Cardiomegaly. Subtle bilateral ground-glass opacities. No pleural
effusion. No pneumothorax.
IMPRESSION: 1. Overall improved aeration since prior radiograph with mild
residual ground-glass opacity bilaterally, likely resolving edema.
2. Cardiomegaly

## 2020-01-04 DIAGNOSIS — N2581 Secondary hyperparathyroidism of renal origin: Secondary | ICD-10-CM | POA: Diagnosis not present

## 2020-01-04 DIAGNOSIS — Z992 Dependence on renal dialysis: Secondary | ICD-10-CM | POA: Diagnosis not present

## 2020-01-04 DIAGNOSIS — N186 End stage renal disease: Secondary | ICD-10-CM | POA: Diagnosis not present

## 2020-01-05 ENCOUNTER — Other Ambulatory Visit: Payer: Self-pay | Admitting: Physician Assistant

## 2020-01-05 DIAGNOSIS — E559 Vitamin D deficiency, unspecified: Secondary | ICD-10-CM

## 2020-01-05 DIAGNOSIS — E1142 Type 2 diabetes mellitus with diabetic polyneuropathy: Secondary | ICD-10-CM

## 2020-01-05 NOTE — Telephone Encounter (Signed)
Requested medication (s) are due for refill today: Yes  Requested medication (s) are on the active medication list: Yes  Last refill:  Vit. D  06/16/19  Future visit scheduled: Yes  Notes to clinic:  Lyrica dose has changed. Vit D is not delegated.    Requested Prescriptions  Pending Prescriptions Disp Refills   pregabalin (LYRICA) 75 MG capsule [Pharmacy Med Name: PREGABALIN 75 MG CAPSULE] 56 capsule 4    Sig: TAKE (1) CAPSULE BY MOUTH TWICE DAILY.      Not Delegated - Neurology:  Anticonvulsants - Controlled Failed - 01/05/2020  3:34 PM      Failed - This refill cannot be delegated      Passed - Valid encounter within last 12 months    Recent Outpatient Visits           2 months ago Diabetes mellitus type 2, uncontrolled, with complications [facility])   [place], [place], PA-C   4 months ago [facility] pain, [facility]    [facility] [place], [place]   7 months ago Acute right-sided low back pain without sciatica   [facility], Vickki Muff, [place]   10 months ago Chronic hepatitis C without hepatic coma [facility])   [place], [place], PA-C   1 year ago ESRD on dialysis [facility])   [place], Clearnce Sorrel, [place]       Future Appointments             In 1 month [place], [place], PA-C [facility]              Vitamin D, Ergocalciferol, (DRISDOL) 1.25 MG (50000 UNIT) CAPS capsule [Pharmacy Med Name: VIT D2 ERGOCAL (50,000 UNIT)] 1 capsule 11    Sig: TAKE [place] A MONTH      Endocrinology:  Vitamins - Vitamin D Supplementation Failed - 01/05/2020  3:34 PM      Failed - 50,000 IU strengths are not delegated      Failed - Ca in normal range and within 360 days    Calcium  Date Value Ref Range Status  12/13/2019 8.3 (L) 8.9 - 10.3 mg/dL Final   Calcium, Ion  Date Value Ref Range Status  09/14/2015 1.00 (L) 1.12 -  1.23 mmol/L Final          Failed - Phosphate in normal range and within 360 days    Phosphorus  Date Value Ref Range Status  11/06/2019 7.0 (H) 3.0 - 4.3 mg/dL Final          Failed - Vitamin D in normal range and within 360 days    No results found for: [id number], [id number], [id number], 25OHVITD3, [place], [place], [place], [place], 25OHVITD2, 25OHVITD3, VD25OH        Passed - Valid encounter within last 12 months    Recent Outpatient Visits           2 months ago Diabetes mellitus type 2, uncontrolled, with complications [facility])   [place], [place], [place]   4 months ago Chest pain, unspecified type   [place], [place]   7 months ago Acute right-sided low back pain without sciatica   [place], [place]   10 months ago Chronic hepatitis C without hepatic coma [facility])   [place], [place], [place]   1 year ago ESRD on dialysis (  [facility])   [place], [place]       Future Appointments             In 1 month [place], Clearnce Sorrel, PA-C [facility]

## 2020-01-06 DIAGNOSIS — N2581 Secondary hyperparathyroidism of renal origin: Secondary | ICD-10-CM | POA: Diagnosis not present

## 2020-01-06 DIAGNOSIS — N186 End stage renal disease: Secondary | ICD-10-CM | POA: Diagnosis not present

## 2020-01-06 DIAGNOSIS — Z992 Dependence on renal dialysis: Secondary | ICD-10-CM | POA: Diagnosis not present

## 2020-01-09 DIAGNOSIS — N2581 Secondary hyperparathyroidism of renal origin: Secondary | ICD-10-CM | POA: Diagnosis not present

## 2020-01-09 DIAGNOSIS — Z992 Dependence on renal dialysis: Secondary | ICD-10-CM | POA: Diagnosis not present

## 2020-01-09 DIAGNOSIS — N186 End stage renal disease: Secondary | ICD-10-CM | POA: Diagnosis not present

## 2020-01-10 ENCOUNTER — Other Ambulatory Visit: Payer: Self-pay | Admitting: Physician Assistant

## 2020-01-10 ENCOUNTER — Telehealth: Payer: Self-pay | Admitting: Physician Assistant

## 2020-01-10 DIAGNOSIS — Z992 Dependence on renal dialysis: Secondary | ICD-10-CM | POA: Diagnosis not present

## 2020-01-10 DIAGNOSIS — E1122 Type 2 diabetes mellitus with diabetic chronic kidney disease: Secondary | ICD-10-CM | POA: Diagnosis not present

## 2020-01-10 DIAGNOSIS — N186 End stage renal disease: Secondary | ICD-10-CM | POA: Diagnosis not present

## 2020-01-10 MED ORDER — TRAMADOL HCL 50 MG PO TABS: 50.0000 mg | ORAL_TABLET | Freq: Two times a day (BID) | ORAL | 0 refills | Status: DC | PRN

## 2020-01-10 NOTE — Telephone Encounter (Signed)
[facility] [employer] called and stated that the Pt advised them that some of her meds were sent to [facility] would like to know which ones and they also would like to know which if any medications the pt needed to no longer be taking/ please advise asap

## 2020-01-10 NOTE — Telephone Encounter (Signed)
Medication Refill - Medication: tramadol   Has the patient contacted their pharmacy? Yes.   (Agent: If no, request that the patient contact the pharmacy for the refill.) (Agent: If yes, when and what did the pharmacy advise?)  Preferred Pharmacy (with phone number or street name):  [id number] [place], [place] [facility] [place]  [address] [place] [address]  Phone: [phone] Fax: [phone]  Hours: Not open 24 hours     Agent: Please be advised that RX refills may take up to 3 business days. We ask that you follow-up with your pharmacy.

## 2020-01-11 DIAGNOSIS — Z992 Dependence on renal dialysis: Secondary | ICD-10-CM | POA: Diagnosis not present

## 2020-01-11 DIAGNOSIS — N2581 Secondary hyperparathyroidism of renal origin: Secondary | ICD-10-CM | POA: Diagnosis not present

## 2020-01-11 DIAGNOSIS — N186 End stage renal disease: Secondary | ICD-10-CM | POA: Diagnosis not present

## 2020-01-11 NOTE — Telephone Encounter (Signed)
This medications are the ones that have been sent to [facility] on different dates. Cyclobenzaprine (10-16-19) Doxepin (11-07-19) Fluticasone (2019) Breo (11-07-19) Levemir (11-07-19) Novolog (07-18-2019) Nystatin (01/28/2021_ Oxycodone-Acetaminophen 11/06/2019 Pregabalin 08-10-19 Tramadol 01/10/20 most recent one.  Please advise which medications the patient no lon longer need to be taking.

## 2020-01-11 NOTE — Telephone Encounter (Signed)
The last time I spoke with [name] in the office she told me she was no longer using [facility] because she had moved and wanted all her medications switched to the pharmacy on file ([facility] [address])

## 2020-01-13 DIAGNOSIS — N186 End stage renal disease: Secondary | ICD-10-CM | POA: Diagnosis not present

## 2020-01-13 DIAGNOSIS — Z992 Dependence on renal dialysis: Secondary | ICD-10-CM | POA: Diagnosis not present

## 2020-01-13 DIAGNOSIS — N2581 Secondary hyperparathyroidism of renal origin: Secondary | ICD-10-CM | POA: Diagnosis not present

## 2020-01-15 ENCOUNTER — Observation Stay
Admission: EM | Admit: 2020-01-15 | Discharge: 2020-01-16 | Disposition: A | Discharge status: Home / Self Care | Payer: Medicare HMO | Attending: Internal Medicine | Admitting: Internal Medicine

## 2020-01-15 ENCOUNTER — Encounter: Payer: Self-pay | Admitting: Emergency Medicine

## 2020-01-15 ENCOUNTER — Emergency Department: Payer: Medicare HMO

## 2020-01-15 ENCOUNTER — Other Ambulatory Visit: Payer: Self-pay

## 2020-01-15 DIAGNOSIS — T827XXA Infection and inflammatory reaction due to other cardiac and vascular devices, implants and grafts, initial encounter: Principal | ICD-10-CM | POA: Insufficient documentation

## 2020-01-15 DIAGNOSIS — E1142 Type 2 diabetes mellitus with diabetic polyneuropathy: Secondary | ICD-10-CM | POA: Insufficient documentation

## 2020-01-15 DIAGNOSIS — E1100 Type 2 diabetes mellitus with hyperosmolarity without nonketotic hyperglycemic-hyperosmolar coma (NKHHC): Secondary | ICD-10-CM | POA: Diagnosis present

## 2020-01-15 DIAGNOSIS — Y832 Surgical operation with anastomosis, bypass or graft as the cause of abnormal reaction of the patient, or of later complication, without mention of misadventure at the time of the procedure: Secondary | ICD-10-CM | POA: Diagnosis not present

## 2020-01-15 DIAGNOSIS — Z992 Dependence on renal dialysis: Secondary | ICD-10-CM | POA: Insufficient documentation

## 2020-01-15 DIAGNOSIS — N2581 Secondary hyperparathyroidism of renal origin: Secondary | ICD-10-CM | POA: Diagnosis not present

## 2020-01-15 DIAGNOSIS — Z6841 Body Mass Index (BMI) 40.0 and over, adult: Secondary | ICD-10-CM | POA: Diagnosis not present

## 2020-01-15 DIAGNOSIS — Z87891 Personal history of nicotine dependence: Secondary | ICD-10-CM | POA: Insufficient documentation

## 2020-01-15 DIAGNOSIS — I132 Hypertensive heart and chronic kidney disease with heart failure and with stage 5 chronic kidney disease, or end stage renal disease: Secondary | ICD-10-CM | POA: Diagnosis not present

## 2020-01-15 DIAGNOSIS — Z7951 Long term (current) use of inhaled steroids: Secondary | ICD-10-CM | POA: Insufficient documentation

## 2020-01-15 DIAGNOSIS — M79622 Pain in left upper arm: Secondary | ICD-10-CM | POA: Diagnosis not present

## 2020-01-15 DIAGNOSIS — M7989 Other specified soft tissue disorders: Secondary | ICD-10-CM

## 2020-01-15 DIAGNOSIS — Z794 Long term (current) use of insulin: Secondary | ICD-10-CM | POA: Diagnosis not present

## 2020-01-15 DIAGNOSIS — Z79899 Other long term (current) drug therapy: Secondary | ICD-10-CM | POA: Insufficient documentation

## 2020-01-15 DIAGNOSIS — L03114 Cellulitis of left upper limb: Secondary | ICD-10-CM | POA: Insufficient documentation

## 2020-01-15 DIAGNOSIS — M79602 Pain in left arm: Secondary | ICD-10-CM

## 2020-01-15 DIAGNOSIS — K219 Gastro-esophageal reflux disease without esophagitis: Secondary | ICD-10-CM | POA: Insufficient documentation

## 2020-01-15 DIAGNOSIS — E1122 Type 2 diabetes mellitus with diabetic chronic kidney disease: Secondary | ICD-10-CM | POA: Diagnosis not present

## 2020-01-15 DIAGNOSIS — D631 Anemia in chronic kidney disease: Secondary | ICD-10-CM

## 2020-01-15 DIAGNOSIS — I252 Old myocardial infarction: Secondary | ICD-10-CM | POA: Diagnosis not present

## 2020-01-15 DIAGNOSIS — Z03818 Encounter for observation for suspected exposure to other biological agents ruled out: Secondary | ICD-10-CM | POA: Diagnosis not present

## 2020-01-15 DIAGNOSIS — Z20822 Contact with and (suspected) exposure to covid-19: Secondary | ICD-10-CM | POA: Diagnosis not present

## 2020-01-15 DIAGNOSIS — N186 End stage renal disease: Secondary | ICD-10-CM | POA: Diagnosis not present

## 2020-01-15 DIAGNOSIS — L0390 Cellulitis, unspecified: Secondary | ICD-10-CM | POA: Diagnosis present

## 2020-01-15 DIAGNOSIS — J45909 Unspecified asthma, uncomplicated: Secondary | ICD-10-CM | POA: Diagnosis not present

## 2020-01-15 DIAGNOSIS — I2510 Atherosclerotic heart disease of native coronary artery without angina pectoris: Secondary | ICD-10-CM | POA: Insufficient documentation

## 2020-01-15 DIAGNOSIS — I714 Abdominal aortic aneurysm, without rupture: Secondary | ICD-10-CM | POA: Insufficient documentation

## 2020-01-15 DIAGNOSIS — I5032 Chronic diastolic (congestive) heart failure: Secondary | ICD-10-CM | POA: Diagnosis present

## 2020-01-15 DIAGNOSIS — Z7982 Long term (current) use of aspirin: Secondary | ICD-10-CM | POA: Insufficient documentation

## 2020-01-15 DIAGNOSIS — M109 Gout, unspecified: Secondary | ICD-10-CM | POA: Insufficient documentation

## 2020-01-15 DIAGNOSIS — G4733 Obstructive sleep apnea (adult) (pediatric): Secondary | ICD-10-CM | POA: Diagnosis not present

## 2020-01-15 DIAGNOSIS — I712 Thoracic aortic aneurysm, without rupture: Secondary | ICD-10-CM | POA: Diagnosis not present

## 2020-01-15 DIAGNOSIS — Z955 Presence of coronary angioplasty implant and graft: Secondary | ICD-10-CM | POA: Insufficient documentation

## 2020-01-15 DIAGNOSIS — I120 Hypertensive chronic kidney disease with stage 5 chronic kidney disease or end stage renal disease: Secondary | ICD-10-CM | POA: Diagnosis not present

## 2020-01-15 LAB — CBC WITH DIFFERENTIAL/PLATELET
Abs Immature Granulocytes: 0.07 10*3/uL (ref 0.00–0.07)
Basophils Absolute: 0 10*3/uL (ref 0.0–0.1)
Basophils Relative: 0 %
Eosinophils Absolute: 0.2 10*3/uL (ref 0.0–0.5)
Eosinophils Relative: 3 %
HCT: 31.6 % — ABNORMAL LOW (ref 36.0–46.0)
Hemoglobin: 10.2 g/dL — ABNORMAL LOW (ref 12.0–15.0)
Immature Granulocytes: 1 %
Lymphocytes Relative: 17 %
Lymphs Abs: 1.2 10*3/uL (ref 0.7–4.0)
MCH: 30.7 pg (ref 26.0–34.0)
MCHC: 32.3 g/dL (ref 30.0–36.0)
MCV: 95.2 fL (ref 80.0–100.0)
Monocytes Absolute: 0.7 10*3/uL (ref 0.1–1.0)
Monocytes Relative: 10 %
Neutro Abs: 4.7 10*3/uL (ref 1.7–7.7)
Neutrophils Relative %: 69 %
Platelets: 160 10*3/uL (ref 150–400)
RBC: 3.32 MIL/uL — ABNORMAL LOW (ref 3.87–5.11)
RDW: 16 % — ABNORMAL HIGH (ref 11.5–15.5)
WBC: 6.8 10*3/uL (ref 4.0–10.5)
nRBC: 0 % (ref 0.0–0.2)

## 2020-01-15 LAB — COMPREHENSIVE METABOLIC PANEL
ALT: 43 U/L (ref 0–44)
AST: 35 U/L (ref 15–41)
Albumin: 3.4 g/dL — ABNORMAL LOW (ref 3.5–5.0)
Alkaline Phosphatase: 295 U/L — ABNORMAL HIGH (ref 38–126)
Anion gap: 14 (ref 5–15)
BUN: 69 mg/dL — ABNORMAL HIGH (ref 8–23)
CO2: 23 mmol/L (ref 22–32)
Calcium: 8.7 mg/dL — ABNORMAL LOW (ref 8.9–10.3)
Chloride: 94 mmol/L — ABNORMAL LOW (ref 98–111)
Creatinine, Ser: 8.97 mg/dL — ABNORMAL HIGH (ref 0.44–1.00)
GFR calc Af Amer: 5 mL/min — ABNORMAL LOW (ref >60)
GFR calc non Af Amer: 4 mL/min — ABNORMAL LOW (ref >60)
Glucose, Bld: 293 mg/dL — ABNORMAL HIGH (ref 70–99)
Potassium: 4.9 mmol/L (ref 3.5–5.1)
Sodium: 131 mmol/L — ABNORMAL LOW (ref 135–145)
Total Bilirubin: 0.7 mg/dL (ref 0.3–1.2)
Total Protein: 7.4 g/dL (ref 6.5–8.1)

## 2020-01-15 NOTE — ED Provider Notes (Signed)
[facility] Emergency Department Provider Note    First MD Initiated Contact with Patient 01/15/20 2256     (approximate)  I have reviewed the triage vital signs and the nursing notes.   HISTORY  Chief Complaint Arm Pain    HPI [name] is a 64 y.o. female with the below listed past medical history presents to the ER for evaluation of worsening pain and swelling to left upper extremity.  She is a dialysis patient last receiving dialysis on Saturday.  Since then started having worsening swelling and pain.  Is not having any fevers.  Does not feel short of breath.  She is due for dialysis tomorrow.  She not any anticoagulation.    Past Medical History:  Diagnosis Date  . Anemia   . Aortic stenosis    Echo 8/18: mean 13, peak 28, LVOT/AV mean velocity 0.51  . Arthritis   . Asthma    As a child   . Bronchitis   . CAD (coronary artery disease)    a. 09/2016: 50% Ost 1st Mrg stenosis, 50% 2nd Mrg stenosis, 20% Mid-Cx, 95% Prox LAD, 40% mid-LAD, and 10% dist-LAD stenosis. Staged PCI with DES to Prox-LAD.   [name] Chronic combined systolic and diastolic CHF (congestive heart failure) ([place]) 2011   echo 2/18: EF 55-60, normal wall motion, grade 2 diastolic dysfunction, trivial AI // echo 3/18: Septal and apical HK, EF 45-50, normal wall motion, trivial AI, mild LAE, PASP 38 // echo 8/18: EF 60-65, normal wall motion, grade 1 diastolic dysfunction, calcified aortic valve leaflets, mild aortic stenosis (mean 13, peak 28, LVOT/AV mean velocity 0.51), mild AI, moderate MAC, mild LAE, trivial TR   . Chronic kidney disease    STAGE 4  . Chronic kidney disease on chronic dialysis (HCC)    t, th, sat  . Complication of anesthesia   . Depression   . Diabetes mellitus Dx 1989  . Elevated lipids   . GERD (gastroesophageal reflux disease)   . Gout   . Heart murmur    asymptomatic  . Hepatitis C Dx 2013  . Hypertension Dx 1989  . Infected surgical wound    Lt arm    . Myocardial infarction ([place]) 07/2015  . Obesity   . Pancreatitis 2013  . Pneumonia   . Refusal of blood transfusions as patient is Jehovah's Witness   . Tendinitis   . Tremors of nervous system    LEFT HAND  . Ulcer 2010   Family History  Problem Relation Age of Onset  . Colon cancer Mother   . Heart attack Other   . Heart attack Maternal Grandmother   . Hypertension Sister   . Hypertension Brother   . Diabetes Paternal Grandmother   . Breast cancer Neg Hx    Past Surgical History:  Procedure Laterality Date  . A/V FISTULAGRAM Left 04/11/2019   Procedure: A/V FISTULAGRAM;  Surgeon: Katha Cabal, MD;  Location: [place] [facility];  Service: Cardiovascular;  Laterality: Left;  . A/V FISTULAGRAM Left 06/02/2019   Procedure: A/V FISTULAGRAM;  Surgeon: Katha Cabal, MD;  Location: [place] [facility];  Service: Cardiovascular;  Laterality: Left;  . APPLICATION OF WOUND VAC Left 08/14/2017   Procedure: APPLICATION OF WOUND VAC Exchange;  Surgeon: Robert Bellow, MD;  Location: [facility] ORS;  Service: General;  Laterality: Left;  . APPLICATION OF WOUND VAC Left 12/21/2018   Procedure: APPLICATION OF WOUND VAC;  Surgeon: Katha Cabal,  MD;  Location: [facility] ORS;  Service: Vascular;  Laterality: Left;  . AV FISTULA PLACEMENT Left 08/19/2018   Procedure: ARTERIOVENOUS (AV) FISTULA CREATION ( BRACHIOBASILIC );  Surgeon: Katha Cabal, MD;  Location: [facility] OR[facility];  Service: Vascular;  Laterality: Left;  . BASCILIC VEIN TRANSPOSITION Left 11/18/2018   Procedure: BASCILIC VEIN TRANSPOSITION;  Surgeon: Katha Cabal, MD;  Location: [facility];  Service: Vascular;  Laterality: Left;  . CHOLECYSTECTOMY    . COLONOSCOPY WITH PROPOFOL N/A 02/03/2018   Procedure: COLONOSCOPY WITH PROPOFOL;  Surgeon: Lin Landsman, MD;  Location: [facility] ENDOSCOPY;  Service: Gastroenterology;  Laterality: N/A;  . CORONARY ANGIOPLASTY  07/2015   STENT  . CORONARY STENT INTERVENTION N/A 09/18/2016    Procedure: Coronary Stent Intervention;  Surgeon: Troy Sine, MD;  Location: [place] CV LAB;  Service: Cardiovascular;  Laterality: N/A;  . DIALYSIS/PERMA CATHETER INSERTION N/A 05/10/2018   Procedure: DIALYSIS/PERMA CATHETER INSERTION;  Surgeon: Katha Cabal, MD;  Location: [place] [facility];  Service: Cardiovascular;  Laterality: N/A;  . DRESSING CHANGE UNDER ANESTHESIA Left 08/15/2017   Procedure: exploration of wound for bleeding;  Surgeon: Robert Bellow, MD;  Location: [facility];  Service: General;  Laterality: Left;  . ESOPHAGOGASTRODUODENOSCOPY (EGD) WITH PROPOFOL N/A 02/03/2018   Procedure: ESOPHAGOGASTRODUODENOSCOPY (EGD) WITH PROPOFOL;  Surgeon: Lin Landsman, MD;  Location: [facility] ENDOSCOPY;  Service: Gastroenterology;  Laterality: N/A;  . EYE SURGERY  11/17/2018  . INCISION AND DRAINAGE ABSCESS Left 08/12/2017   Procedure: INCISION AND DRAINAGE ABSCESS;  Surgeon: Robert Bellow, MD;  Location: [facility] OR[facility];  Service: General;  Laterality: Left;  . KNEE ARTHROSCOPY    . LEFT HEART CATH N/A 09/18/2016   Procedure: Left Heart Cath;  Surgeon: Troy Sine, MD;  Location: [place] [facility];  Service: Cardiovascular;  Laterality: N/A;  . LEFT HEART CATH AND CORONARY ANGIOGRAPHY N/A 09/16/2016   Procedure: Left Heart Cath and Coronary Angiography;  Surgeon: Burnell Blanks, MD;  Location: [place] [facility];  Service: Cardiovascular;  Laterality: N/A;  . LEFT HEART CATH AND CORONARY ANGIOGRAPHY N/A 04/29/2017   Procedure: LEFT HEART CATH AND CORONARY ANGIOGRAPHY;  Surgeon: Nelva Bush, MD;  Location: [place] [facility];  Service: Cardiovascular;  Laterality: N/A;  . LOWER EXTREMITY ANGIOGRAPHY Right 03/08/2018   Procedure: LOWER EXTREMITY ANGIOGRAPHY;  Surgeon: Katha Cabal, MD;  Location: [place] [facility];  Service: Cardiovascular;  Laterality: Right;  . TUBAL LIGATION    . TUBAL LIGATION    . UPPER EXTREMITY ANGIOGRAPHY Right 09/19/2019   Procedure:  UPPER EXTREMITY ANGIOGRAPHY;  Surgeon: Katha Cabal, MD;  Location: [place] [facility];  Service: Cardiovascular;  Laterality: Right;  . WOUND DEBRIDEMENT Left 12/21/2018   Procedure: DEBRIDEMENT WOUND;  Surgeon: Katha Cabal, MD;  Location: [facility];  Service: Vascular;  Laterality: Left;  . WOUND DEBRIDEMENT Left 12/30/2018   Procedure: DEBRIDEMENT WOUND WITH VAC PLACEMENT (LEFT UPPER EXTREMITY);  Surgeon: Katha Cabal, MD;  Location: [facility];  Service: Vascular;  Laterality: Left;   Patient Active Problem List   Diagnosis Date Noted  . Diabetic polyneuropathy associated with type 2 diabetes mellitus ([place]) 11/06/2019  . Hemodialysis status ([place]) 11/06/2019  . Nystagmus 11/06/2019  . Intermittent claudication ([place]) 11/06/2019  . Vertigo of central origin 11/06/2019  . Weakness   . Hypervolemia associated with renal insufficiency 10/31/2019  . Ascending aortic aneurysm ([place]) 03/27/2019  . Mild protein-calorie malnutrition ([place]) 01/25/2019  . Puncture wound  without foreign body of left forearm, initial encounter 01/02/2019  . Non-healing wound of upper extremity 12/28/2018  . Unspecified open wound of left upper arm, initial encounter 12/06/2018  . Complication from renal dialysis device 10/20/2018  . Coagulation defect, unspecified ([place]) 10/18/2018  . Iron deficiency anemia, unspecified 10/18/2018  . Secondary hyperparathyroidism of renal origin ([place]) 10/18/2018  . ESRD (end stage renal disease) on dialysis ([place]) 06/01/2018  . ARF (acute renal failure) ([place]) 05/09/2018  . Colon cancer screening   . LGSIL on Pap smear of cervix 01/27/2018  . Gout 12/25/2017  . AKI (acute kidney injury) ([place]) 12/24/2017  . Hyperglycemia 12/24/2017  . Chronic diastolic heart failure ([place]) 09/30/2017  . Lymphedema 09/30/2017  . Aortic stenosis   . Chronic pain of right knee 02/12/2017  . Chronic gout due to renal impairment of multiple sites without tophus 02/12/2017  . Esophageal  dysphagia 02/12/2017  . Osteoarthritis 01/22/2017  . Diabetic hyperosmolar non-ketotic state ([place]) 12/13/2016  . CAD (coronary artery disease) 12/13/2016  . Hyperlipidemia 12/13/2016  . Hyperphosphatemia 12/13/2016  . Asthma 11/29/2015  . Depression 09/05/2015  . GERD (gastroesophageal reflux disease) 03/25/2015  . Environmental allergies 03/14/2015  . SOB (shortness of breath) 02/15/2015  . Chronic hepatitis C without hepatic coma ([place]) 01/31/2015  . Diabetic neuropathy ([place]) 01/31/2015  . OSA (obstructive sleep apnea) 01/01/2015  . Poor dentition 11/13/2014  . Essential hypertension 10/08/2014  . Morbid (severe) obesity due to excess calories ([place]) 10/08/2014  . Diabetes mellitus type 2, uncontrolled, with complications ([place]) 07/21/3233    Class: Chronic      Prior to Admission medications   Medication Sig Start Date End Date Taking? Authorizing Provider  albuterol (VENTOLIN HFA) 108 (90 Base) MCG/ACT inhaler Inhale 2 puffs into the lungs every 4 (four) hours as needed for wheezing or shortness of breath. 10/21/19 12/26/19  Janeece Fitting, PA-C  allopurinol (ZYLOPRIM) 100 MG tablet TAKE 1 TABLET BY MOUTH ONCE DAILY. 12/08/19   [name], PA-C  aspirin EC 81 MG EC tablet Take 1 tablet (81 mg total) by mouth daily. 09/19/16   Strader, Fransisco Hertz, PA-C  atorvastatin (LIPITOR) 80 MG tablet TAKE 1 TABLET BY MOUTH ONCE DAILY AT 6PM 11/09/19   [name], PA-C  AURYXIA 1 GM 210 MG(Fe) tablet Take 420 mg by mouth 3 (three) times daily with meals.  10/10/18   [provider]  b complex-vitamin c-folic acid (NEPHRO-VITE) 0.8 MG TABS tablet Take 1 tablet by mouth daily. Patient not taking: Reported on 12/26/2019    [provider]  carvedilol (COREG) 25 MG tablet TAKE (1) TABLET BY MOUTH TWICE A DAY WITH MEALS (BREAKFAST AND SUPPER) 11/09/19   Burnette, Clearnce Sorrel, PA-C  cyclobenzaprine (FLEXERIL) 5 MG tablet Take 1 tablet (5 mg total) by mouth 3 (three) times  daily as needed for muscle spasms. 08/10/19   [name], PA-C  dicyclomine (BENTYL) 20 MG tablet TAKE 1 TABLET(20 MG) BY MOUTH FOUR TIMES DAILY BEFORE MEALS AND AT BEDTIME 10/16/19   [name], PA-C  docusate sodium (COLACE) 100 MG capsule Take 1 capsule (100 mg total) by mouth 2 (two) times daily as needed for mild constipation. Patient not taking: Reported on 12/26/2019 09/20/17   Nicholes Mango, MD  doxepin (SINEQUAN) 10 MG capsule Take 1 capsule (10 mg total) by mouth 4 (four) times daily as needed. For itching 11/07/19   [name], PA-C  DULERA 200-5 MCG/ACT AERO Inhale 2 puffs into the lungs  2 (two) times daily. 11/26/19   [provider]  fluticasone (FLONASE) 50 MCG/ACT nasal spray Place 2 sprays into both nostrils daily as needed for allergies or rhinitis. 09/28/17   [name], PA-C  fluticasone furoate-vilanterol (BREO ELLIPTA) 200-25 MCG/INH AEPB Inhale 1 puff into the lungs daily. 11/07/19   [name], PA-C  gentamicin cream (GARAMYCIN) 0.1 % Apply 1 application topically 2 (two) times daily. Patient not taking: Reported on 12/26/2019 11/20/19   Edrick Kins, DPM  hydrALAZINE (APRESOLINE) 100 MG tablet TAKE (1) TABLET BY MOUTH THREE TIMES DAILY Patient taking differently: Take 100 mg by mouth 3 (three) times daily.  08/14/19   [name], PA-C  HYDROcodone-acetaminophen (NORCO) 5-325 MG tablet Take 1 tablet by mouth every 4 (four) hours as needed for moderate pain. 12/13/19   Merlyn Lot, MD  HYDROcodone-acetaminophen (NORCO/VICODIN) 5-325 MG tablet Take 1 tablet by mouth every 6 (six) hours as needed for moderate pain. Patient not taking: Reported on 12/26/2019 11/20/19   Edrick Kins, DPM  LEVEMIR FLEXTOUCH 100 UNIT/ML FlexPen INJECT 60 UNITS UNDER THE SKIN TWICE DAILY 11/07/19   [name], PA-C  lidocaine-prilocaine (EMLA) cream Apply 1 application topically as needed. 03/27/19   Schnier, Dolores Lory, MD    liraglutide (VICTOZA) 18 MG/3ML SOPN Start with 0.61m daily and increase by 0.673mper week until max dose of 1.8 mg dose achieved. Patient taking differently: Inject 0.6-1.8 mg into the skin See admin instructions. Start with 0.65m28maily and increase by 0.8mg55mr week until max dose of 1.2mg 73me achieved. 09/08/19   BurneMar DaringC  LOPERAMIDE HCL PO Take 1 tablet by mouth as needed.  10/21/19 10/19/20  [provider]  loratadine (CLARITIN) 10 MG tablet TAKE 1 TABLET BY MOUTH ONCE DAILY. Patient taking differently: Take 10 mg by mouth at bedtime.  08/11/19   BurneMar DaringC  mometasone (ELOCON) 0.1 % cream APPLY AS DIRECTED TO AFFECTED AREA ONCE DAILY. Patient taking differently: Apply 1 application topically daily as needed.  12/08/18   BurneMar DaringC  montelukast (SINGULAIR) 10 MG tablet TAKE ONE TABLET BY MOUTH AT BEDTIME. Patient taking differently: Take 10 mg by mouth at bedtime.  08/11/19   BurneMar DaringC  nitroGLYCERIN (NITROSTAT) 0.4 MG SL tablet Place 1 tablet (0.4 mg total) under the tongue every 5 (five) minutes x 3 doses as needed for chest pain. 02/03/19   Bhagat, Bhavinkumar, PA  NOVOLOG FLEXPEN 100 UNIT/ML FlexPen USE 25 UNITS UNDER THE SKIN THREE TIMES DAILY WITH MEALS Patient taking differently: Inject 25 Units into the skin 3 (three) times daily with meals.  07/18/19   BurneMar DaringC  nystatin cream (MYCOSTATIN) Apply 1 application topically 2 (two) times daily. Patient taking differently: Apply 1 application topically daily as needed for dry skin.  08/10/19   BurneMar DaringC  omeprazole (PRILOSEC) 20 MG capsule TAKE (1) CAPSULE BY MOUTH ONCE DAILY. Patient taking differently: Take 20 mg by mouth every evening.  08/11/19   BurneMar DaringC  oxyCODONE-acetaminophen (PERCOCET/ROXICET) 5-325 MG tablet Take 1 tablet by mouth every 8 (eight) hours as needed for severe pain. Patient not taking: Reported on  12/26/2019 11/06/19   BurneMar DaringC  pregabalin (LYRICA) 150 MG capsule Take 1 capsule (150 mg total) by mouth 2 (two) times daily. 08/10/19   Burnette, JenniClearnce SorrelC  SENNA PLUS 8.6-50 MG tablet TAKE ONE TABLET BY MOUTH AT  BEDTIME. Patient taking differently: Take 1 tablet by mouth at bedtime as needed.  08/11/19   [name], PA-C  silver sulfADIAZINE (SILVADENE) 1 % cream Apply 1 application topically daily. Apply to right first toe Patient taking differently: Apply 1 application topically daily as needed (pain).  02/21/19   Kris Hartmann, NP  torsemide (DEMADEX) 20 MG tablet TAKE (2) TABLETS BY MOUTH TWICE DAILY. Patient taking differently: Take 40 mg by mouth 2 (two) times daily.  04/20/19   [name], PA-C  traMADol (ULTRAM) 50 MG tablet Take 1 tablet (50 mg total) by mouth 2 (two) times daily as needed. 01/10/20   [name], PA-C  triamcinolone cream (KENALOG) 0.1 % APPLY TO AFFECTED AREA TWICE DAILY 06/17/18   [name], PA-C  valACYclovir (VALTREX) 500 MG tablet TAKE (1) TABLET BY MOUTH EVERY OTHER DAY. 10/06/19   [name], PA-C  Vitamin D, Ergocalciferol, (DRISDOL) 1.25 MG (50000 UT) CAPS capsule TAKE 1 CAPSULE BY MOUTH ONCE A MONTH Patient taking differently: Take 50,000 Units by mouth every 30 (thirty) days.  06/16/19   [name], PA-C  gabapentin (NEURONTIN) 100 MG capsule Take 1 capsule (100 mg total) by mouth 3 (three) times daily. 08/19/17 02/03/19  [name], MD    Allergies Shellfish allergy and Diazepam    Social History Social History   Tobacco Use  . Smoking status: Former Smoker    Packs/day: 0.25    Years: 6.00    Pack years: 1.50    Types: Cigarettes    Quit date: 10/25/1980    Years since quitting: 39.2  . Smokeless tobacco: Never Used  Vaping Use  . Vaping Use: Never used  Substance Use Topics  . Alcohol use: Not Currently  . Drug use: Yes    Types: Marijuana    Comment: Occasional  marijuana use 08/21/2016 "no crack-clean since 05/1998"    Review of Systems Patient denies headaches, rhinorrhea, blurry vision, numbness, shortness of breath, chest pain, edema, cough, abdominal pain, nausea, vomiting, diarrhea, dysuria, fevers, rashes or hallucinations unless otherwise stated above in HPI. ____________________________________________   PHYSICAL EXAM:  VITAL SIGNS: Vitals:   01/15/20 2230 01/15/20 2300  BP: 130/75 (!) 147/66  Pulse: 81 79  Resp: 19 18  Temp:    SpO2: 98% 95%    Constitutional: Alert and oriented.  Eyes: Conjunctivae are normal.  Head: Atraumatic. Nose: No congestion/rhinnorhea. Mouth/Throat: Mucous membranes are moist.   Neck: No stridor. Painless ROM.  Cardiovascular: Normal rate, regular rhythm. Grossly normal heart sounds.  Good peripheral circulation. Respiratory: Normal respiratory effort.  No retractions. Lungs CTAB. Gastrointestinal: Soft and nontender. No distention. No abdominal bruits. No CVA tenderness. Genitourinary: deferred Musculoskeletal: Left fistula with ecchymosis and swelling.  Has good distal perfusion.  Have a palpable thrill.  No rapid swelling no lower extremity tenderness nor edema.  No joint effusions. Neurologic:  Normal speech and language. No gross focal neurologic deficits are appreciated. No facial droop Skin:  Skin is warm, dry and intact. No rash noted. Psychiatric: Mood and affect are normal. Speech and behavior are normal.  ____________________________________________   LABS (all labs ordered are listed, but only abnormal results are displayed)  Results for orders placed or performed during the hospital encounter of 01/15/20 (from the past 24 hour(s))  CBC with Differential     Status: Abnormal   Collection Time: 01/15/20  5:26 PM  Result Value Ref Range   WBC 6.8 4.0 - 10.5 K/uL  RBC 3.32 (L) 3.87 - 5.11 MIL/uL   Hemoglobin 10.2 (L) 12.0 - 15.0 g/dL   HCT 31.6 (L) 36 - 46 %   MCV 95.2 80.0 - 100.0  fL   MCH 30.7 26.0 - 34.0 pg   MCHC 32.3 30.0 - 36.0 g/dL   RDW 16.0 (H) 11.5 - 15.5 %   Platelets 160 150 - 400 K/uL   nRBC 0.0 0.0 - 0.2 %   Neutrophils Relative % 69 %   Neutro Abs 4.7 1.7 - 7.7 K/uL   Lymphocytes Relative 17 %   Lymphs Abs 1.2 0.7 - 4.0 K/uL   Monocytes Relative 10 %   Monocytes Absolute 0.7 0 - 1 K/uL   Eosinophils Relative 3 %   Eosinophils Absolute 0.2 0 - 0 K/uL   Basophils Relative 0 %   Basophils Absolute 0.0 0 - 0 K/uL   Immature Granulocytes 1 %   Abs Immature Granulocytes 0.07 0.00 - 0.07 K/uL  Comprehensive metabolic panel     Status: Abnormal   Collection Time: 01/15/20  5:26 PM  Result Value Ref Range   Sodium 131 (L) 135 - 145 mmol/L   Potassium 4.9 3.5 - 5.1 mmol/L   Chloride 94 (L) 98 - 111 mmol/L   CO2 23 22 - 32 mmol/L   Glucose, Bld 293 (H) 70 - 99 mg/dL   BUN 69 (H) 8 - 23 mg/dL   Creatinine, Ser 8.97 (H) 0.44 - 1.00 mg/dL   Calcium 8.7 (L) 8.9 - 10.3 mg/dL   Total Protein 7.4 6.5 - 8.1 g/dL   Albumin 3.4 (L) 3.5 - 5.0 g/dL   AST 35 15 - 41 U/L   ALT 43 0 - 44 U/L   Alkaline Phosphatase 295 (H) 38 - 126 U/L   Total Bilirubin 0.7 0.3 - 1.2 mg/dL   GFR calc non Af Amer 4 (L) >60 mL/min   GFR calc Af Amer 5 (L) >60 mL/min   Anion gap 14 5 - 15   *Note: Due to a large number of results and/or encounters for the requested time period, some results have not been displayed. A complete set of results can be found in Results Review.   ____________________________________________ ____________________________________________  [id number]  I personally reviewed all radiographic images ordered to evaluate for the above acute complaints and reviewed radiology reports and findings.  These findings were personally discussed with the patient.  Please see medical record for radiology report.  ____________________________________________   PROCEDURES  Procedure(s) performed:  Procedures    Critical Care performed:  no ____________________________________________   INITIAL IMPRESSION / ASSESSMENT AND PLAN / ED COURSE  Pertinent labs & imaging results that were available during my care of the patient were reviewed by me and considered in my medical decision making (see chart for details).   DDX: Fistula complication, ecchymosis, cellulitis, infected graft  [name] is a 64 y.o. who presents to the ED with symptoms as described above.  Patient nontoxic-appearing but does have large amount of swelling and ecchymosis to left upper extremity overlying fistula.  No evidence of DVT.  She is having mild pain related to that.  Denies any chest pain or shortness of breath.  Discussed case with Dr. Candiss Norse of nephrology does recommend observation the hospital for vascular consult and dialysis in the morning to ensure that she is able to resume outpatient dialysis.     The patient was evaluated in Emergency Department today for the symptoms described in the history  of present illness. He/she was evaluated in the context of the global COVID-19 pandemic, which necessitated consideration that the patient might be at risk for infection with the SARS-CoV-2 virus that causes COVID-19. Institutional protocols and algorithms that pertain to the evaluation of patients at risk for COVID-19 are in a state of rapid change based on information released by regulatory bodies including the CDC and federal and state organizations. These policies and algorithms were followed during the patient's care in the ED.  As part of my medical decision making, I reviewed the following data within the [place] notes reviewed and incorporated, Labs reviewed, notes from prior ED visits and [place] Controlled Substance Database   ____________________________________________   FINAL CLINICAL IMPRESSION(S) / ED DIAGNOSES  Final diagnoses:  ESRD (end stage renal disease) on dialysis ([place])      NEW MEDICATIONS STARTED  DURING THIS VISIT:  New Prescriptions   No medications on file     Note:  This document was prepared using Dragon voice recognition software and may include unintentional dictation errors.    Merlyn Lot, MD 01/15/20 2317

## 2020-01-15 NOTE — ED Notes (Signed)
Pt c/o left arm pain r/t dialysis fistula. Pt received dialysis on Saturday and arm has been hurting. Pt states fistula was placed this years but has been accessed many times.

## 2020-01-15 NOTE — ED Triage Notes (Signed)
PT to ER with c/o left upper arm swelling and bruising around dialysis site.  Pt states started Saturday after her last dialysis treatment.  Skin temperature feels similar to other arm.  Pt reports mild pain and feels like the swelling is getting worse.

## 2020-01-16 DIAGNOSIS — N2581 Secondary hyperparathyroidism of renal origin: Secondary | ICD-10-CM | POA: Diagnosis not present

## 2020-01-16 DIAGNOSIS — D631 Anemia in chronic kidney disease: Secondary | ICD-10-CM

## 2020-01-16 DIAGNOSIS — E1165 Type 2 diabetes mellitus with hyperglycemia: Secondary | ICD-10-CM | POA: Diagnosis not present

## 2020-01-16 DIAGNOSIS — I120 Hypertensive chronic kidney disease with stage 5 chronic kidney disease or end stage renal disease: Secondary | ICD-10-CM | POA: Diagnosis not present

## 2020-01-16 DIAGNOSIS — L0390 Cellulitis, unspecified: Secondary | ICD-10-CM | POA: Diagnosis present

## 2020-01-16 DIAGNOSIS — L03114 Cellulitis of left upper limb: Secondary | ICD-10-CM | POA: Diagnosis not present

## 2020-01-16 DIAGNOSIS — Z992 Dependence on renal dialysis: Secondary | ICD-10-CM

## 2020-01-16 DIAGNOSIS — I5032 Chronic diastolic (congestive) heart failure: Secondary | ICD-10-CM

## 2020-01-16 DIAGNOSIS — T829XXA Unspecified complication of cardiac and vascular prosthetic device, implant and graft, initial encounter: Secondary | ICD-10-CM | POA: Diagnosis not present

## 2020-01-16 DIAGNOSIS — E118 Type 2 diabetes mellitus with unspecified complications: Secondary | ICD-10-CM

## 2020-01-16 DIAGNOSIS — Z03818 Encounter for observation for suspected exposure to other biological agents ruled out: Secondary | ICD-10-CM | POA: Diagnosis not present

## 2020-01-16 DIAGNOSIS — N186 End stage renal disease: Secondary | ICD-10-CM

## 2020-01-16 DIAGNOSIS — T827XXA Infection and inflammatory reaction due to other cardiac and vascular devices, implants and grafts, initial encounter: Secondary | ICD-10-CM | POA: Diagnosis not present

## 2020-01-16 LAB — BASIC METABOLIC PANEL
Anion gap: 12 (ref 5–15)
BUN: 73 mg/dL — ABNORMAL HIGH (ref 8–23)
CO2: 25 mmol/L (ref 22–32)
Calcium: 8.3 mg/dL — ABNORMAL LOW (ref 8.9–10.3)
Chloride: 95 mmol/L — ABNORMAL LOW (ref 98–111)
Creatinine, Ser: 9.82 mg/dL — ABNORMAL HIGH (ref 0.44–1.00)
GFR calc Af Amer: 4 mL/min — ABNORMAL LOW (ref >60)
GFR calc non Af Amer: 4 mL/min — ABNORMAL LOW (ref >60)
Glucose, Bld: 331 mg/dL — ABNORMAL HIGH (ref 70–99)
Potassium: 5.1 mmol/L (ref 3.5–5.1)
Sodium: 132 mmol/L — ABNORMAL LOW (ref 135–145)

## 2020-01-16 LAB — GLUCOSE, CAPILLARY
Glucose-Capillary: 293 mg/dL — ABNORMAL HIGH (ref 70–99)
Glucose-Capillary: 348 mg/dL — ABNORMAL HIGH (ref 70–99)
Glucose-Capillary: 232 mg/dL — ABNORMAL HIGH (ref 70–99)

## 2020-01-16 LAB — CBC
HCT: 29.1 % — ABNORMAL LOW (ref 36.0–46.0)
Hemoglobin: 9.6 g/dL — ABNORMAL LOW (ref 12.0–15.0)
MCH: 31.2 pg (ref 26.0–34.0)
MCHC: 33 g/dL (ref 30.0–36.0)
MCV: 94.5 fL (ref 80.0–100.0)
Platelets: 158 10*3/uL (ref 150–400)
RBC: 3.08 MIL/uL — ABNORMAL LOW (ref 3.87–5.11)
RDW: 16.1 % — ABNORMAL HIGH (ref 11.5–15.5)
WBC: 6.7 10*3/uL (ref 4.0–10.5)
nRBC: 0 % (ref 0.0–0.2)

## 2020-01-16 LAB — SARS CORONAVIRUS 2 BY RT PCR (HOSPITAL ORDER, PERFORMED IN ~~LOC~~ HOSPITAL LAB): SARS Coronavirus 2: NEGATIVE

## 2020-01-16 LAB — HEMOGLOBIN A1C
Hgb A1c MFr Bld: 8.7 % — ABNORMAL HIGH (ref 4.8–5.6)
Mean Plasma Glucose: 202.99 mg/dL

## 2020-01-16 LAB — HIV ANTIBODY (ROUTINE TESTING W REFLEX): HIV Screen 4th Generation wRfx: NONREACTIVE

## 2020-01-16 MED ORDER — INSULIN ASPART 100 UNIT/ML ~~LOC~~ SOLN
0.0000 [IU] | Freq: Three times a day (TID) | SUBCUTANEOUS | Status: DC
  Administered 2020-01-16 (×2): 2 [IU] via SUBCUTANEOUS
  Administered 2020-01-16: 4 [IU] via SUBCUTANEOUS
  Filled 2020-01-16 (×3): qty 1

## 2020-01-16 MED ORDER — ATORVASTATIN CALCIUM 20 MG PO TABS
80.0000 mg | ORAL_TABLET | Freq: Every day | ORAL | Status: DC
  Administered 2020-01-16: 80 mg via ORAL
  Filled 2020-01-16: qty 4

## 2020-01-16 MED ORDER — SODIUM CHLORIDE 0.9 % IV SOLN
2.0000 g | INTRAVENOUS | Status: DC
  Administered 2020-01-16: 2 g via INTRAVENOUS
  Filled 2020-01-16 (×2): qty 20

## 2020-01-16 MED ORDER — LIDOCAINE-PRILOCAINE 2.5-2.5 % EX CREA
TOPICAL_CREAM | Freq: Once | CUTANEOUS | Status: DC
  Filled 2020-01-16: qty 5

## 2020-01-16 MED ORDER — CHLORHEXIDINE GLUCONATE CLOTH 2 % EX PADS
6.0000 | MEDICATED_PAD | Freq: Every day | CUTANEOUS | Status: DC
  Administered 2020-01-16: 6 via TOPICAL

## 2020-01-16 MED ORDER — EPOETIN ALFA 10000 UNIT/ML IJ SOLN: 4000.0000 [IU] | INTRAMUSCULAR | Status: DC

## 2020-01-16 NOTE — Care Management Obs Status (Signed)
[place] NOTIFICATION   Patient Details  Name: [name] MRN: [id number] Date of Birth: 1956-05-03   Medicare Observation Status Notification Given:  Yes    Candie Chroman, LCSW 01/16/2020, 4:50 PM

## 2020-01-16 NOTE — Progress Notes (Signed)
Hemodialysis patient known at [place] ([facility]) TTS 5:30am. Patient self transports to treatments. Please contact me with any dialysis placement concerns.  Elvera Bicker Dialysis Coordinator [phone]

## 2020-01-16 NOTE — Progress Notes (Signed)
Hd started  

## 2020-01-16 NOTE — Progress Notes (Signed)
This note also relates to the following rows which could not be included: Pulse Rate - Cannot attach notes to unvalidated device data BP - Cannot attach notes to unvalidated device data  Hd completed  

## 2020-01-16 NOTE — Progress Notes (Signed)
Education provided pre discharge, all questions answered, patient verbalized understanding. IV removed. Patient would like to eat her dinner and then her daughter will come pick her up for discharge.

## 2020-01-16 NOTE — Discharge Instructions (Signed)
Resume your hemodialysis outpatient schedule as before

## 2020-01-16 NOTE — Discharge Summary (Signed)
[name]: [name]    MR#:  [id number]  DATE OF BIRTH:  [birth year]  DATE OF ADMISSION:  01/15/2020 ADMITTING PHYSICIAN: Heather Desanctis, DO  DATE OF DISCHARGE: 01/16/2020  PRIMARY CARE PHYSICIAN: Heather Daring, PA-C    ADMISSION DIAGNOSIS:  Cellulitis [L03.90] ESRD (end stage renal disease) on dialysis ([place]) [N18.6, Z99.2] Pain and swelling of left upper extremity [M79.602, M79.89]  DISCHARGE DIAGNOSIS:  HD access infiltration--tolerated HD well. No s/o infection  SECONDARY DIAGNOSIS:   Past Medical History:  Diagnosis Date  . Anemia   . Aortic stenosis    Echo 8/18: mean 13, peak 28, LVOT/AV mean velocity 0.51  . Arthritis   . Asthma    As a child   . Bronchitis   . CAD (coronary artery disease)    a. 09/2016: 50% Ost 1st Mrg stenosis, 50% 2nd Mrg stenosis, 20% Mid-Cx, 95% Prox LAD, 40% mid-LAD, and 10% dist-LAD stenosis. Staged PCI with DES to Prox-LAD.   [name] Chronic combined systolic and diastolic CHF (congestive heart failure) ([place]) 2011   echo 2/18: EF 55-60, normal wall motion, grade 2 diastolic dysfunction, trivial AI // echo 3/18: Septal and apical HK, EF 45-50, normal wall motion, trivial AI, mild LAE, PASP 38 // echo 8/18: EF 60-65, normal wall motion, grade 1 diastolic dysfunction, calcified aortic valve leaflets, mild aortic stenosis (mean 13, peak 28, LVOT/AV mean velocity 0.51), mild AI, moderate MAC, mild LAE, trivial TR   . Chronic kidney disease    STAGE 4  . Chronic kidney disease on chronic dialysis (HCC)    t, th, sat  . Complication of anesthesia   . Depression   . Diabetes mellitus Dx 1989  . Elevated lipids   . GERD (gastroesophageal reflux disease)   . Gout   . Heart murmur    asymptomatic  . Hepatitis C Dx 2013  . Hypertension Dx 1989  . Infected surgical wound    Lt arm  . Myocardial infarction ([place]) 07/2015  . Obesity   . Pancreatitis 2013  . Pneumonia   . Refusal of blood  transfusions as patient is Jehovah's Witness   . Tendinitis   . Tremors of nervous system    LEFT HAND  . Ulcer 2010    HOSPITAL COURSE:   [name] is a 64 y.o. female with medical history significant for  ESRD on HD Tuesday/Thursday/Saturday, diastolic CHF, hypertension, CAD, AAA who presents with concerns of pain and erythema to the left AV fistula. Patient had dialysis on Saturday as usual without issue.  Then the following day her daughter noticed discoloration around her fistula site.  Mild painat left AV fistula site due to infiltration of HD access -patient did well with hemodialysis no indication for antibiotics white count normal. -Dr. Candiss Norse agrees with above plan. No fever. Patient feels well after dialysis. She is okay to go home. -left UE doppler negative for blood clot. Patent left AV fistula  ESRD on HD Tues/Thurs/Sat Creatinine stable near baseline she tolerated dialysis well. Okay from nephrology standpoint to discharge.  Insulin-dependent type 2 diabetes resume home meds  Chronic anemia of CKD Stable  Diastolic CHF Appears euvolemic  Morbid obesity BMI greater than 40  DVT prophylaxis: SCD  code Status: Full Family Communication: Plan discussed with patient at bedside  disposition Plan: Home Consults called: nephrology Admission status: observation   Dispo: The patient is from: Home  Anticipated d/c is to:  Home  Anticipated d/c date is: today  Patient currently is  medically stable to d/c.   CONSULTS OBTAINED:  Treatment Team:  Murlean Iba, MD  DRUG ALLERGIES:   Allergies  Allergen Reactions  . Shellfish Allergy Anaphylaxis and Swelling  . Diazepam Other (See Comments)    "felt like out of body experience"    DISCHARGE MEDICATIONS:   Allergies as of 01/16/2020      Reactions   Shellfish Allergy Anaphylaxis, Swelling   Diazepam Other (See Comments)   "felt like out of body  experience"      Medication List    STOP taking these medications   HYDROcodone-acetaminophen 5-325 MG tablet Commonly known as: Norco     TAKE these medications   albuterol 108 (90 Base) MCG/ACT inhaler Commonly known as: VENTOLIN HFA Inhale 2 puffs into the lungs every 4 (four) hours as needed for wheezing or shortness of breath.   allopurinol 100 MG tablet Commonly known as: ZYLOPRIM TAKE 1 TABLET BY MOUTH ONCE DAILY.   aspirin 81 MG EC tablet Take 1 tablet (81 mg total) by mouth daily.   atorvastatin 80 MG tablet Commonly known as: LIPITOR TAKE 1 TABLET BY MOUTH ONCE DAILY AT 6PM   Auryxia 1 GM 210 MG(Fe) tablet Generic drug: ferric citrate Take 420 mg by mouth 3 (three) times daily with meals.   b complex-vitamin c-folic acid 0.8 MG Tabs tablet Take 1 tablet by mouth daily.   carvedilol 25 MG tablet Commonly known as: COREG TAKE (1) TABLET BY MOUTH TWICE A DAY WITH MEALS (BREAKFAST AND SUPPER)   cyclobenzaprine 5 MG tablet Commonly known as: FLEXERIL Take 1 tablet (5 mg total) by mouth 3 (three) times daily as needed for muscle spasms.   dicyclomine 20 MG tablet Commonly known as: BENTYL TAKE 1 TABLET(20 MG) BY MOUTH FOUR TIMES DAILY BEFORE MEALS AND AT BEDTIME   docusate sodium 100 MG capsule Commonly known as: COLACE Take 1 capsule (100 mg total) by mouth 2 (two) times daily as needed for mild constipation.   doxepin 10 MG capsule Commonly known as: SINEQUAN Take 1 capsule (10 mg total) by mouth 4 (four) times daily as needed. For itching   Dulera 200-5 MCG/ACT Aero Generic drug: mometasone-formoterol Inhale 2 puffs into the lungs 2 (two) times daily as needed.   fluticasone 50 MCG/ACT nasal spray Commonly known as: FLONASE Place 2 sprays into both nostrils daily as needed for allergies or rhinitis.   fluticasone furoate-vilanterol 200-25 MCG/INH Aepb Commonly known as: BREO ELLIPTA Inhale 1 puff into the lungs daily. What changed:   when to  take this  reasons to take this   gentamicin cream 0.1 % Commonly known as: GARAMYCIN Apply 1 application topically 2 (two) times daily. What changed:   when to take this  reasons to take this   hydrALAZINE 100 MG tablet Commonly known as: APRESOLINE TAKE (1) TABLET BY MOUTH THREE TIMES DAILY What changed: See the new instructions.   hydrOXYzine 25 MG tablet Commonly known as: ATARAX/VISTARIL Take 25 mg by mouth 4 (four) times daily.   Levemir FlexTouch 100 UNIT/ML FlexPen Generic drug: insulin detemir INJECT 60 UNITS UNDER THE SKIN TWICE DAILY   lidocaine-prilocaine cream Commonly known as: EMLA Apply 1 application topically as needed.   LOPERAMIDE HCL PO Take 1 tablet by mouth as needed.   loratadine 10 MG tablet Commonly known as: CLARITIN TAKE 1 TABLET BY MOUTH ONCE DAILY. What changed: when to take this   mometasone  0.1 % cream Commonly known as: ELOCON APPLY AS DIRECTED TO AFFECTED AREA ONCE DAILY. What changed: See the new instructions.   montelukast 10 MG tablet Commonly known as: SINGULAIR TAKE ONE TABLET BY MOUTH AT BEDTIME.   nitroGLYCERIN 0.4 MG SL tablet Commonly known as: NITROSTAT Place 1 tablet (0.4 mg total) under the tongue every 5 (five) minutes x 3 doses as needed for chest pain.   NovoLOG FlexPen 100 UNIT/ML FlexPen Generic drug: insulin aspart USE 25 UNITS UNDER THE SKIN THREE TIMES DAILY WITH MEALS What changed: See the new instructions.   nystatin cream Commonly known as: MYCOSTATIN Apply 1 application topically 2 (two) times daily. What changed:   when to take this  reasons to take this   omeprazole 20 MG capsule Commonly known as: PRILOSEC TAKE (1) CAPSULE BY MOUTH ONCE DAILY. What changed: See the new instructions.   oxyCODONE-acetaminophen 5-325 MG tablet Commonly known as: PERCOCET/ROXICET Take 1 tablet by mouth every 8 (eight) hours as needed for severe pain.   pregabalin 150 MG capsule Commonly known as:  Lyrica Take 1 capsule (150 mg total) by mouth 2 (two) times daily.   Senna Plus 8.6-50 MG tablet Generic drug: senna-docusate TAKE ONE TABLET BY MOUTH AT BEDTIME. What changed:   when to take this  reasons to take this   SENSIPAR PO Take 30 mg by mouth every dialysis.   silver sulfADIAZINE 1 % cream Commonly known as: SILVADENE Apply 1 application topically daily. Apply to right first toe What changed: See the new instructions.   torsemide 20 MG tablet Commonly known as: DEMADEX TAKE (2) TABLETS BY MOUTH TWICE DAILY. What changed: See the new instructions.   traMADol 50 MG tablet Commonly known as: ULTRAM Take 1 tablet (50 mg total) by mouth 2 (two) times daily as needed.   triamcinolone cream 0.1 % Commonly known as: KENALOG APPLY TO AFFECTED AREA TWICE DAILY What changed: See the new instructions.   valACYclovir 500 MG tablet Commonly known as: VALTREX TAKE (1) TABLET BY MOUTH EVERY OTHER DAY.   Victoza 18 MG/3ML Sopn Generic drug: liraglutide Start with 0.72m daily and increase by 0.666mper week until max dose of 1.8 mg dose achieved. What changed:   how much to take  how to take this  when to take this  additional instructions   Vitamin D (Ergocalciferol) 1.25 MG (50000 UNIT) Caps capsule Commonly known as: DRISDOL TAKE 1 CAPSULE BY MOUTH ONCE A MONTH What changed: See the new instructions.       If you experience worsening of your admission symptoms, develop shortness of breath, life threatening emergency, suicidal or homicidal thoughts you must seek medical attention immediately by calling 911 or calling your MD immediately  if symptoms less severe.  You Must read complete instructions/literature along with all the possible adverse reactions/side effects for all the Medicines you take and that have been prescribed to you. Take any new Medicines after you have completely understood and accept all the possible adverse reactions/side effects.   Please  note  You were cared for by a hospitalist during your hospital stay. If you have any questions about your discharge medications or the care you received while you were in the hospital after you are discharged, you can call the unit and asked to speak with the hospitalist on call if the hospitalist that took care of you is not available. Once you are discharged, your primary care physician will handle any further medical issues. Please note that  NO REFILLS for any discharge medications will be authorized once you are discharged, as it is imperative that you return to your primary care physician (or establish a relationship with a primary care physician if you do not have one) for your aftercare needs so that they can reassess your need for medications and monitor your lab values. Today   SUBJECTIVE   Seen it hemodialysis. Patient doing well. No pain at the access site. Dialysis going well  VITAL SIGNS:  Blood pressure (!) 106/52, pulse 84, temperature (P) 98.9 F (37.2 C), temperature source (P) Oral, resp. rate 18, height _0  (1.727 m), weight 131.1 kg, SpO2 98 %.  I/O:    Intake/Output Summary (Last 24 hours) at 01/16/2020 1708 Last data filed at 01/16/2020 1600 Gross per 24 hour  Intake 220 ml  Output 1500 ml  Net -1280 ml    PHYSICAL EXAMINATION:  GENERAL:  64 y.o.-year-old patient lying in the bed with no acute distress. Morbid obesity EYES: Pupils equal, round, reactive to light and accommodation. No scleral icterus.  HEENT: Head atraumatic, normocephalic. Oropharynx and nasopharynx clear.  NECK:  Supple, no jugular venous distention. No thyroid enlargement, no tenderness.  LUNGS: Normal breath sounds bilaterally, no wheezing, rales,rhonchi or crepitation. No use of accessory muscles of respiration.  CARDIOVASCULAR: S1, S2 normal. No murmurs, rubs, or gallops.  ABDOMEN: Soft, non-tender, non-distended. Bowel sounds present. No organomegaly or mass.  EXTREMITIES: No pedal edema,  cyanosis, or clubbing. HD Access site left upper extremity. Some chronic discoloration. No significant erythema/cellulitis NEUROLOGIC: Cranial nerves II through XII are intact. Muscle strength 5/5 in all extremities. Sensation intact. Gait not checked.  PSYCHIATRIC: The patient is alert and oriented x 3.  SKIN: No obvious rash, lesion, or ulcer.   DATA REVIEW:   CBC  Recent Labs  Lab 01/16/20 0438  WBC 6.7  HGB 9.6*  HCT 29.1*  PLT 158    Chemistries  Recent Labs  Lab 01/15/20 1726 01/15/20 1726 01/16/20 0438  NA 131*   < > 132*  K 4.9   < > 5.1  CL 94*   < > 95*  CO2 23   < > 25  GLUCOSE 293*   < > 331*  BUN 69*   < > 73*  CREATININE 8.97*   < > 9.82*  CALCIUM 8.7*   < > 8.3*  AST 35  --   --   ALT 43  --   --   ALKPHOS 295*  --   --   BILITOT 0.7  --   --    < > = values in this interval not displayed.    Microbiology Results   Recent Results (from the past 240 hour(s))  SARS Coronavirus 2 by RT PCR (hospital order, performed in [facility] hospital lab) Nasopharyngeal Nasopharyngeal Swab     Status: None   Collection Time: 01/16/20  1:23 AM   Specimen: Nasopharyngeal Swab  Result Value Ref Range Status   SARS Coronavirus 2 NEGATIVE NEGATIVE Final    Comment: (NOTE) SARS-CoV-2 target nucleic acids are NOT DETECTED.  The SARS-CoV-2 RNA is generally detectable in upper and lower respiratory specimens during the acute phase of infection. The lowest concentration of SARS-CoV-2 viral copies this assay can detect is 250 copies / mL. A negative result does not preclude SARS-CoV-2 infection and should not be used as the sole basis for treatment or other patient management decisions.  A negative result may occur with improper specimen collection /  handling, submission of specimen other than nasopharyngeal swab, presence of viral mutation(s) within the areas targeted by this assay, and inadequate number of viral copies (<250 copies / mL). A negative result must be  combined with clinical observations, patient history, and epidemiological information.  Fact Sheet for Patients:   StrictlyIdeas.no  Fact Sheet for Healthcare Providers: BankingDealers.co.za  This test is not yet approved or  cleared by the [place] FDA and has been authorized for detection and/or diagnosis of SARS-CoV-2 by FDA under an Emergency Use Authorization (EUA).  This EUA will remain in effect (meaning this test can be used) for the duration of the COVID-19 declaration under Section 564(b)(1) of the Act, 21 U.S.C. section 360bbb-3(b)(1), unless the authorization is terminated or revoked sooner.  Performed at [facility], [address] [place], [place] [address]     RADIOLOGY:  US Venous Img Upper Uni Left (DVT)  Result Date: 01/15/2020 CLINICAL DATA:  Upper extremity pain and swelling for 2 days EXAM: LEFT UPPER EXTREMITY VENOUS DOPPLER ULTRASOUND TECHNIQUE: Gray-scale sonography with graded compression, as well as color Doppler and duplex ultrasound were performed to evaluate the upper extremity deep venous system from the level of the subclavian vein and including the jugular, axillary, basilic, radial, ulnar and upper cephalic vein. Spectral Doppler was utilized to evaluate flow at rest and with distal augmentation maneuvers. COMPARISON:  None. FINDINGS: Contralateral Subclavian Vein: Respiratory phasicity is normal and symmetric with the symptomatic side. No evidence of thrombus. Normal compressibility. Internal Jugular Vein: No evidence of thrombus. Normal compressibility, respiratory phasicity and response to augmentation. Subclavian Vein: No evidence of thrombus. Normal compressibility, respiratory phasicity and response to augmentation. Axillary Vein: No evidence of thrombus. Normal compressibility, respiratory phasicity and response to augmentation. Cephalic Vein: No evidence of thrombus. Normal compressibility,  respiratory phasicity and response to augmentation. Basilic Vein: No evidence of thrombus. Normal compressibility, respiratory phasicity and response to augmentation. Brachial Veins: No evidence of thrombus. Normal compressibility, respiratory phasicity and response to augmentation. Radial Veins: No evidence of thrombus. Normal compressibility, respiratory phasicity and response to augmentation. Ulnar Veins: No evidence of thrombus. Normal compressibility, respiratory phasicity and response to augmentation. Venous Reflux:  None visualized. Other Findings: Basilic dialysis fistula is well visualized and within normal limits. No thrombus is noted within. IMPRESSION: No evidence of DVT within the left upper extremity. Patent left basilic transposition fistula. Electronically Signed   By: Inez Catalina M.D.   On: 01/15/2020 19:19     CODE STATUS:     Code Status Orders  (From admission, onward)         Start     Ordered   01/16/20 0029  Full code  Continuous        01/16/20 0029        Code Status History    Date Active Date Inactive Code Status Order ID Comments User Context   10/31/2019 1611 11/01/2019 1908 Full Code [id number]  Mosetta Anis, MD ED   09/19/2019 1423 09/19/2019 2119 Full Code [id number]  Delana Meyer, Dolores Lory, MD Inpatient   12/28/2018 2041 01/02/2019 2150 Full Code [id number]  Mayer Camel, NP ED   05/10/2018 0121 05/14/2018 1902 Full Code [id number]  Amelia Jo, MD ED   03/08/2018 0925 03/10/2018 0312 Full Code [id number]  Schnier, Dolores Lory, MD Inpatient   12/25/2017 0059 12/28/2017 1721 Full Code [id number]  Toy Baker, MD ED   10/03/2017 1803 10/11/2017 1625 Full Code [id number]  Epifanio Lesches, MD ED  09/14/2017 1310 09/20/2017 1434 Full Code [id number]  Max Sane, MD ED   08/15/2017 2306 08/19/2017 2039 Full Code [id number]  Robert Bellow, MD Inpatient   08/11/2017 1350 08/15/2017 2306 Full Code [id number]  Demetrios Loll, MD Inpatient   06/02/2017 0234 06/06/2017 1536 Full Code  [id number]  Caren Griffins, MD Inpatient   04/28/2017 0516 05/02/2017 1624 Full Code [id number]  Rise Patience, MD ED   03/17/2017 1222 03/19/2017 2030 Full Code [id number]  Liliane Shi, PA-C ED   12/13/2016 0345 12/14/2016 1647 Full Code [id number]  Reubin Milan, MD Inpatient   09/13/2016 1149 09/19/2016 1458 Full Code [id number]  Flossie Dibble, MD ED   09/04/2016 0137 09/05/2016 1801 Full Code [id number]  Etta Quill, DO ED   08/21/2016 1846 08/22/2016 1733 Full Code [id number]  Thurnell Lose, MD Inpatient   03/18/2015 0939 03/19/2015 1802 Full Code [id number]  Dellia Nims, MD Inpatient   02/15/2015 1700 02/18/2015 1817 Full Code [id number]  Francesca Oman, DO Inpatient   10/02/2014 1524 10/04/2014 1649 Full Code [id number]  Samella Parr, NP Inpatient   03/25/2012 0531 04/03/2012 1931 Full Code [id number]  Maudry Diego, RN Inpatient   Advance Care Planning Activity       TOTAL TIME TAKING CARE OF THIS PATIENT: *40* minutes.    [name] M.D  [facility]    CC: Primary care physician; Heather Daring, PA-C

## 2020-01-16 NOTE — Progress Notes (Signed)
This note also relates to the following rows which could not be included: Pulse Rate - Cannot attach notes to unvalidated device data  Hd completed 

## 2020-01-16 NOTE — Progress Notes (Signed)
Inpatient Diabetes Program Recommendations  AACE/ADA: New Consensus Statement on Inpatient Glycemic Control   Target Ranges:  Prepandial:   less than 140 mg/dL      Peak postprandial:   less than 180 mg/dL (1-2 hours)      Critically ill patients:  140 - 180 mg/dL   Results for GLORYA, BARTLEY (MRN [id number]) as of 01/16/2020 08:11  Ref. Range 01/16/2020 07:54  Glucose-Capillary Latest Ref Range: 70 - 99 mg/dL 293 (H)  Results for NAYDELINE, MORACE (MRN [id number]) as of 01/16/2020 08:11  Ref. Range 01/15/2020 17:26 01/16/2020 04:38  Glucose Latest Ref Range: 70 - 99 mg/dL 293 (H) 331 (H)   Review of Glycemic Control  Diabetes history: DM2 Outpatient Diabetes medications: Levemir 60 units BID, Victoza 0.6 mg daily, Novolog 25 units TID with meals Current orders for Inpatient glycemic control: Novolog 0-6 units AC&HS  Inpatient Diabetes Program Recommendations:   Insulin - Basal: Please consider ordering Levemir 25 units daily. If Levemir ordered as recommended and glucose remains consistently elevated, may need to add HS dose of Levemir.  Thanks, Barnie Alderman, RN, MSN, CDE Diabetes Coordinator Inpatient Diabetes Program [phone] (Team Pager from 8am to 5pm)

## 2020-01-16 NOTE — Plan of Care (Signed)
  Problem: Education: Goal: Knowledge of General Education information will improve Description: Including pain rating scale, medication(s)/side effects and non-pharmacologic comfort measures Outcome: Adequate for Discharge   Problem: Health Behavior/Discharge Planning: Goal: Ability to manage health-related needs will improve Outcome: Adequate for Discharge   Problem: Clinical Measurements: Goal: Ability to maintain clinical measurements within normal limits will improve Outcome: Adequate for Discharge Goal: Will remain free from infection Outcome: Adequate for Discharge Goal: Diagnostic test results will improve Outcome: Adequate for Discharge Goal: Respiratory complications will improve Outcome: Adequate for Discharge Goal: Cardiovascular complication will be avoided Outcome: Adequate for Discharge   Problem: Nutrition: Goal: Adequate nutrition will be maintained Outcome: Adequate for Discharge   Problem: Coping: Goal: Level of anxiety will decrease Outcome: Adequate for Discharge   Problem: Elimination: Goal: Will not experience complications related to bowel motility Outcome: Adequate for Discharge Goal: Will not experience complications related to urinary retention Outcome: Adequate for Discharge   Problem: Pain Managment: Goal: General experience of comfort will improve Outcome: Adequate for Discharge   Problem: Safety: Goal: Ability to remain free from injury will improve Outcome: Adequate for Discharge   Problem: Skin Integrity: Goal: Risk for impaired skin integrity will decrease Outcome: Adequate for Discharge   

## 2020-01-16 NOTE — H&P (Signed)
History and Physical    [name] QIW:[id number] DOB: Mar 27, 1956 DOA: 01/15/2020  PCP: [name], PA-C  Patient coming from: Home  I have personally briefly reviewed patient's old medical records in [facility]  Chief Complaint: Pain and redness of the left AV fistula site  HPI: [name] is a 64 y.o. female with medical history significant for  ESRD on HD Tuesday/Thursday/Saturday, diastolic CHF, hypertension, CAD, AAA who presents with concerns of pain and erythema to the left AV fistula.  Patient had dialysis on Saturday as usual without issue.  Then the following day her daughter noticed discoloration around her fistula site.  It has been more painful.  She is also noted chills and nausea and decreased appetite.  No fever. She has chronic numbness of her fingers but no new worsening numbness or weakness.  In the ED, she was afebrile with occasional borderline low blood pressure on room air.  No leukocytosis, stable anemia with hemoglobin of 10.2. Sodium of 131, glucose of 293, stable creatinine of 8.97 near baseline.  Ultrasound of the left upper extremity showed no evidence of DVT.  Patent left basilic transposition fistula  ED physician Dr. Quentin Cornwall spoke with Dr. Candiss Norse of nephrology who recommended she be admitted for vascular consult prior to her receiving her regular dialysis tomorrow.   Review of Systems:  Constitutional: No Weight Change, No Fever ENT/Mouth: No sore throat, No Rhinorrhea Eyes: No Eye Pain, No Vision Changes Cardiovascular: No Chest Pain, no SOB Respiratory: No Cough, No Sputum, No Wheezing, no Dyspnea  Gastrointestinal: +Nausea, No Vomiting, No Diarrhea, No Constipation, No Pain Genitourinary: no Urinary Incontinence Musculoskeletal: No Arthralgias, No Myalgias Skin: No Skin Lesions, No Pruritus, Neuro: no Weakness, + Numbness Psych: No Anxiety/Panic, No Depression, no decrease appetite Heme/Lymph: No Bruising, No  Bleeding  Past Medical History:  Diagnosis Date  . Anemia   . Aortic stenosis    Echo 8/18: mean 13, peak 28, LVOT/AV mean velocity 0.51  . Arthritis   . Asthma    As a child   . Bronchitis   . CAD (coronary artery disease)    a. 09/2016: 50% Ost 1st Mrg stenosis, 50% 2nd Mrg stenosis, 20% Mid-Cx, 95% Prox LAD, 40% mid-LAD, and 10% dist-LAD stenosis. Staged PCI with DES to Prox-LAD.   [name] Chronic combined systolic and diastolic CHF (congestive heart failure) ([place]) 2011   echo 2/18: EF 55-60, normal wall motion, grade 2 diastolic dysfunction, trivial AI // echo 3/18: Septal and apical HK, EF 45-50, normal wall motion, trivial AI, mild LAE, PASP 38 // echo 8/18: EF 60-65, normal wall motion, grade 1 diastolic dysfunction, calcified aortic valve leaflets, mild aortic stenosis (mean 13, peak 28, LVOT/AV mean velocity 0.51), mild AI, moderate MAC, mild LAE, trivial TR   . Chronic kidney disease    STAGE 4  . Chronic kidney disease on chronic dialysis (HCC)    t, th, sat  . Complication of anesthesia   . Depression   . Diabetes mellitus Dx 1989  . Elevated lipids   . GERD (gastroesophageal reflux disease)   . Gout   . Heart murmur    asymptomatic  . Hepatitis C Dx 2013  . Hypertension Dx 1989  . Infected surgical wound    Lt arm  . Myocardial infarction ([place]) 07/2015  . Obesity   . Pancreatitis 2013  . Pneumonia   . Refusal of blood transfusions as patient is Jehovah's Witness   . Tendinitis   .  Tremors of nervous system    LEFT HAND  . Ulcer 2010    Past Surgical History:  Procedure Laterality Date  . A/V FISTULAGRAM Left 04/11/2019   Procedure: A/V FISTULAGRAM;  Surgeon: Katha Cabal, MD;  Location: [place] [facility];  Service: Cardiovascular;  Laterality: Left;  . A/V FISTULAGRAM Left 06/02/2019   Procedure: A/V FISTULAGRAM;  Surgeon: Katha Cabal, MD;  Location: [place] [facility];  Service: Cardiovascular;  Laterality: Left;  . APPLICATION OF WOUND VAC  Left 08/14/2017   Procedure: APPLICATION OF WOUND VAC Exchange;  Surgeon: Robert Bellow, MD;  Location: [facility];  Service: General;  Laterality: Left;  . APPLICATION OF WOUND VAC Left 12/21/2018   Procedure: APPLICATION OF WOUND VAC;  Surgeon: Katha Cabal, MD;  Location: [facility];  Service: Vascular;  Laterality: Left;  . AV FISTULA PLACEMENT Left 08/19/2018   Procedure: ARTERIOVENOUS (AV) FISTULA CREATION ( BRACHIOBASILIC );  Surgeon: Katha Cabal, MD;  Location: [facility];  Service: Vascular;  Laterality: Left;  . BASCILIC VEIN TRANSPOSITION Left 11/18/2018   Procedure: BASCILIC VEIN TRANSPOSITION;  Surgeon: Katha Cabal, MD;  Location: [facility];  Service: Vascular;  Laterality: Left;  . CHOLECYSTECTOMY    . COLONOSCOPY WITH PROPOFOL N/A 02/03/2018   Procedure: COLONOSCOPY WITH PROPOFOL;  Surgeon: Lin Landsman, MD;  Location: [facility];  Service: Gastroenterology;  Laterality: N/A;  . CORONARY ANGIOPLASTY  07/2015   STENT  . CORONARY STENT INTERVENTION N/A 09/18/2016   Procedure: Coronary Stent Intervention;  Surgeon: Troy Sine, MD;  Location: [place] [facility];  Service: Cardiovascular;  Laterality: N/A;  . DIALYSIS/PERMA CATHETER INSERTION N/A 05/10/2018   Procedure: DIALYSIS/PERMA CATHETER INSERTION;  Surgeon: Katha Cabal, MD;  Location: [place] [facility];  Service: Cardiovascular;  Laterality: N/A;  . DRESSING CHANGE UNDER ANESTHESIA Left 08/15/2017   Procedure: exploration of wound for bleeding;  Surgeon: Robert Bellow, MD;  Location: [facility];  Service: General;  Laterality: Left;  . ESOPHAGOGASTRODUODENOSCOPY (EGD) WITH PROPOFOL N/A 02/03/2018   Procedure: ESOPHAGOGASTRODUODENOSCOPY (EGD) WITH PROPOFOL;  Surgeon: Lin Landsman, MD;  Location: [facility];  Service: Gastroenterology;  Laterality: N/A;  . EYE SURGERY  11/17/2018  . INCISION AND DRAINAGE ABSCESS Left 08/12/2017   Procedure: INCISION AND DRAINAGE ABSCESS;  Surgeon:  Robert Bellow, MD;  Location: [facility];  Service: General;  Laterality: Left;  . KNEE ARTHROSCOPY    . LEFT HEART CATH N/A 09/18/2016   Procedure: Left Heart Cath;  Surgeon: Troy Sine, MD;  Location: [place] [facility];  Service: Cardiovascular;  Laterality: N/A;  . LEFT HEART CATH AND CORONARY ANGIOGRAPHY N/A 09/16/2016   Procedure: Left Heart Cath and Coronary Angiography;  Surgeon: Burnell Blanks, MD;  Location: [place] [facility];  Service: Cardiovascular;  Laterality: N/A;  . LEFT HEART CATH AND CORONARY ANGIOGRAPHY N/A 04/29/2017   Procedure: LEFT HEART CATH AND CORONARY ANGIOGRAPHY;  Surgeon: Nelva Bush, MD;  Location: [place] [facility];  Service: Cardiovascular;  Laterality: N/A;  . LOWER EXTREMITY ANGIOGRAPHY Right 03/08/2018   Procedure: LOWER EXTREMITY ANGIOGRAPHY;  Surgeon: Katha Cabal, MD;  Location: [place] [facility];  Service: Cardiovascular;  Laterality: Right;  . TUBAL LIGATION    . TUBAL LIGATION    . UPPER EXTREMITY ANGIOGRAPHY Right 09/19/2019   Procedure: UPPER EXTREMITY ANGIOGRAPHY;  Surgeon: Katha Cabal, MD;  Location: [place] [facility];  Service: Cardiovascular;  Laterality: Right;  . WOUND  DEBRIDEMENT Left 12/21/2018   Procedure: DEBRIDEMENT WOUND;  Surgeon: Katha Cabal, MD;  Location: [facility];  Service: Vascular;  Laterality: Left;  . WOUND DEBRIDEMENT Left 12/30/2018   Procedure: DEBRIDEMENT WOUND WITH VAC PLACEMENT (LEFT UPPER EXTREMITY);  Surgeon: Katha Cabal, MD;  Location: [facility];  Service: Vascular;  Laterality: Left;     reports that she quit smoking about 39 years ago. Her smoking use included cigarettes. She has a 1.50 pack-year smoking history. She has never used smokeless tobacco. She reports previous alcohol use. She reports current drug use. Drug: Marijuana.  Allergies  Allergen Reactions  . Shellfish Allergy Anaphylaxis and Swelling  . Diazepam Other (See Comments)    "felt like out of body  experience"    Family History  Problem Relation Age of Onset  . Colon cancer Mother   . Heart attack Other   . Heart attack Maternal Grandmother   . Hypertension Sister   . Hypertension Brother   . Diabetes Paternal Grandmother   . Breast cancer Neg Hx      Prior to Admission medications   Medication Sig Start Date End Date Taking? Authorizing Provider  albuterol (VENTOLIN HFA) 108 (90 Base) MCG/ACT inhaler Inhale 2 puffs into the lungs every 4 (four) hours as needed for wheezing or shortness of breath. 10/21/19 12/26/19  Janeece Fitting, PA-C  allopurinol (ZYLOPRIM) 100 MG tablet TAKE 1 TABLET BY MOUTH ONCE DAILY. 12/08/19   [name], PA-C  aspirin EC 81 MG EC tablet Take 1 tablet (81 mg total) by mouth daily. 09/19/16   Strader, Fransisco Hertz, PA-C  atorvastatin (LIPITOR) 80 MG tablet TAKE 1 TABLET BY MOUTH ONCE DAILY AT 6PM 11/09/19   [name], PA-C  AURYXIA 1 GM 210 MG(Fe) tablet Take 420 mg by mouth 3 (three) times daily with meals.  10/10/18   [provider]  b complex-vitamin c-folic acid (NEPHRO-VITE) 0.8 MG TABS tablet Take 1 tablet by mouth daily. Patient not taking: Reported on 12/26/2019    [provider]  carvedilol (COREG) 25 MG tablet TAKE (1) TABLET BY MOUTH TWICE A DAY WITH MEALS (BREAKFAST AND SUPPER) 11/09/19   Burnette, Clearnce Sorrel, PA-C  cyclobenzaprine (FLEXERIL) 5 MG tablet Take 1 tablet (5 mg total) by mouth 3 (three) times daily as needed for muscle spasms. 08/10/19   [name], PA-C  dicyclomine (BENTYL) 20 MG tablet TAKE 1 TABLET(20 MG) BY MOUTH FOUR TIMES DAILY BEFORE MEALS AND AT BEDTIME 10/16/19   [name], PA-C  docusate sodium (COLACE) 100 MG capsule Take 1 capsule (100 mg total) by mouth 2 (two) times daily as needed for mild constipation. Patient not taking: Reported on 12/26/2019 09/20/17   Nicholes Mango, MD  doxepin (SINEQUAN) 10 MG capsule Take 1 capsule (10 mg total) by mouth 4 (four) times daily as  needed. For itching 11/07/19   [name], PA-C  DULERA 200-5 MCG/ACT AERO Inhale 2 puffs into the lungs 2 (two) times daily. 11/26/19   [provider]  fluticasone (FLONASE) 50 MCG/ACT nasal spray Place 2 sprays into both nostrils daily as needed for allergies or rhinitis. 09/28/17   [name], PA-C  fluticasone furoate-vilanterol (BREO ELLIPTA) 200-25 MCG/INH AEPB Inhale 1 puff into the lungs daily. 11/07/19   [name], PA-C  gentamicin cream (GARAMYCIN) 0.1 % Apply 1 application topically 2 (two) times daily. Patient not taking: Reported on 12/26/2019 11/20/19   Edrick Kins, DPM  hydrALAZINE (APRESOLINE)  100 MG tablet TAKE (1) TABLET BY MOUTH THREE TIMES DAILY Patient taking differently: Take 100 mg by mouth 3 (three) times daily.  08/14/19   [name], PA-C  HYDROcodone-acetaminophen (NORCO) 5-325 MG tablet Take 1 tablet by mouth every 4 (four) hours as needed for moderate pain. 12/13/19   Merlyn Lot, MD  HYDROcodone-acetaminophen (NORCO/VICODIN) 5-325 MG tablet Take 1 tablet by mouth every 6 (six) hours as needed for moderate pain. Patient not taking: Reported on 12/26/2019 11/20/19   Edrick Kins, DPM  LEVEMIR FLEXTOUCH 100 UNIT/ML FlexPen INJECT 60 UNITS UNDER THE SKIN TWICE DAILY 11/07/19   [name], PA-C  lidocaine-prilocaine (EMLA) cream Apply 1 application topically as needed. 03/27/19   Schnier, Dolores Lory, MD  liraglutide (VICTOZA) 18 MG/3ML SOPN Start with 0.512m daily and increase by 0.639mper week until max dose of 1.8 mg dose achieved. Patient taking differently: Inject 0.6-1.8 mg into the skin See admin instructions. Start with 0.12m83maily and increase by 0.8mg2mr week until max dose of 1.2mg 12me achieved. 09/08/19   BurneMar DaringC  LOPERAMIDE HCL PO Take 1 tablet by mouth as needed.  10/21/19 10/19/20  [provider]  loratadine (CLARITIN) 10 MG tablet TAKE 1 TABLET BY MOUTH ONCE DAILY. Patient  taking differently: Take 10 mg by mouth at bedtime.  08/11/19   BurneMar DaringC  mometasone (ELOCON) 0.1 % cream APPLY AS DIRECTED TO AFFECTED AREA ONCE DAILY. Patient taking differently: Apply 1 application topically daily as needed.  12/08/18   BurneMar DaringC  montelukast (SINGULAIR) 10 MG tablet TAKE ONE TABLET BY MOUTH AT BEDTIME. Patient taking differently: Take 10 mg by mouth at bedtime.  08/11/19   BurneMar DaringC  nitroGLYCERIN (NITROSTAT) 0.4 MG SL tablet Place 1 tablet (0.4 mg total) under the tongue every 5 (five) minutes x 3 doses as needed for chest pain. 02/03/19   Bhagat, Bhavinkumar, PA  NOVOLOG FLEXPEN 100 UNIT/ML FlexPen USE 25 UNITS UNDER THE SKIN THREE TIMES DAILY WITH MEALS Patient taking differently: Inject 25 Units into the skin 3 (three) times daily with meals.  07/18/19   BurneMar DaringC  nystatin cream (MYCOSTATIN) Apply 1 application topically 2 (two) times daily. Patient taking differently: Apply 1 application topically daily as needed for dry skin.  08/10/19   BurneMar DaringC  omeprazole (PRILOSEC) 20 MG capsule TAKE (1) CAPSULE BY MOUTH ONCE DAILY. Patient taking differently: Take 20 mg by mouth every evening.  08/11/19   BurneMar DaringC  oxyCODONE-acetaminophen (PERCOCET/ROXICET) 5-325 MG tablet Take 1 tablet by mouth every 8 (eight) hours as needed for severe pain. Patient not taking: Reported on 12/26/2019 11/06/19   BurneMar DaringC  pregabalin (LYRICA) 150 MG capsule Take 1 capsule (150 mg total) by mouth 2 (two) times daily. 08/10/19   Burnette, Jennifer M, PA-C  SENNA PLUS 8.6-50 MG tablet TAKE ONE TABLET BY MOUTH AT BEDTIME. Patient taking differently: Take 1 tablet by mouth at bedtime as needed.  08/11/19   BurneMar DaringC  silver sulfADIAZINE (SILVADENE) 1 % cream Apply 1 application topically daily. Apply to right first toe Patient taking differently: Apply 1 application topically  daily as needed (pain).  02/21/19   BrownKris Hartmann torsemide (DEMADEX) 20 MG tablet TAKE (2) TABLETS BY MOUTH TWICE DAILY. Patient taking differently: Take 40 mg by mouth 2 (two) times daily.  04/20/19   BurneMar DaringC  traMADol (ULTRAM) 50 MG tablet Take 1 tablet (50 mg total) by mouth 2 (two) times daily as needed. 01/10/20   [name], PA-C  triamcinolone cream (KENALOG) 0.1 % APPLY TO AFFECTED AREA TWICE DAILY 06/17/18   [name], PA-C  valACYclovir (VALTREX) 500 MG tablet TAKE (1) TABLET BY MOUTH EVERY OTHER DAY. 10/06/19   [name], PA-C  Vitamin D, Ergocalciferol, (DRISDOL) 1.25 MG (50000 UT) CAPS capsule TAKE 1 CAPSULE BY MOUTH ONCE A MONTH Patient taking differently: Take 50,000 Units by mouth every 30 (thirty) days.  06/16/19   [name], PA-C  gabapentin (NEURONTIN) 100 MG capsule Take 1 capsule (100 mg total) by mouth 3 (three) times daily. 08/19/17 02/03/19  [name], MD    Physical Exam: Vitals:   01/15/20 1719 01/15/20 2035 01/15/20 2230 01/15/20 2300  BP:  (!) 127/46 130/75 (!) 147/66  Pulse:  77 81 79  Resp:  _0 Temp:  (!) 97.4 F (36.3 C)    TempSrc:      SpO2:  95% 98% 95%  Weight: 131.1 kg     Height: _1  (1.727 m)       Constitutional: NAD, calm, comfortable, nontoxic appearing morbidly obese female laying at 40 degree incline in bed Vitals:   01/15/20 1719 01/15/20 2035 01/15/20 2230 01/15/20 2300  BP:  (!) 127/46 130/75 (!) 147/66  Pulse:  77 81 79  Resp:  _2 Temp:  (!) 97.4 F (36.3 C)    TempSrc:      SpO2:  95% 98% 95%  Weight: 131.1 kg     Height: _3  (1.727 m)      Eyes: PERRL, lids and conjunctivae normal ENMT: Mucous membranes are moist.  Neck: normal, supple Respiratory: clear to auscultation bilaterally, no wheezing, no crackles. Normal respiratory effort. No accessory muscle use.  Cardiovascular: Regular rate and rhythm, no murmurs / rubs / gallops. No extremity  edema. 2+ pedal pulses.  Abdomen: no tenderness, no masses palpated. No hepatosplenomegaly. Bowel sounds positive.  Musculoskeletal: no clubbing / cyanosis.  No bruit on auscultation and no palpable thrill of left AV fistula.  Skin: Discoloration/erythema mildly increased warmth over the left AV fistula site Neurologic: CN 2-12 grossly intact. Sensation intact. Strength 5/5 in all 4.  Psychiatric: Normal judgment and insight. Alert and oriented x 3. Normal mood.     Labs on Admission: I have personally reviewed following labs and imaging studies  CBC: Recent Labs  Lab 01/15/20 1726  WBC 6.8  NEUTROABS 4.7  HGB 10.2*  HCT 31.6*  MCV 95.2  PLT 476   Basic Metabolic Panel: Recent Labs  Lab 01/15/20 1726  NA 131*  K 4.9  CL 94*  CO2 23  GLUCOSE 293*  BUN 69*  CREATININE 8.97*  CALCIUM 8.7*   GFR: Estimated Creatinine Clearance: 9.4 mL/min (A) (by C-G formula based on SCr of 8.97 mg/dL (H)). Liver Function Tests: Recent Labs  Lab 01/15/20 1726  AST 35  ALT 43  ALKPHOS 295*  BILITOT 0.7  PROT 7.4  ALBUMIN 3.4*   No results for input(s): LIPASE, AMYLASE in the last 168 hours. No results for input(s): AMMONIA in the last 168 hours. Coagulation Profile: No results for input(s): INR, PROTIME in the last 168 hours. Cardiac Enzymes: No results for input(s): CKTOTAL, CKMB, CKMBINDEX, TROPONINI in the last 168 hours. BNP (last 3 results) No results for input(s): PROBNP in the last 8760 hours. HbA1C:  No results for input(s): HGBA1C in the last 72 hours. CBG: No results for input(s): GLUCAP in the last 168 hours. Lipid Profile: No results for input(s): CHOL, HDL, LDLCALC, TRIG, CHOLHDL, LDLDIRECT in the last 72 hours. Thyroid Function Tests: No results for input(s): TSH, T4TOTAL, FREET4, T3FREE, THYROIDAB in the last 72 hours. Anemia Panel: No results for input(s): VITAMINB12, FOLATE, FERRITIN, TIBC, IRON, RETICCTPCT in the last 72 hours. Urine analysis:     Component Value Date/Time   COLORURINE AMBER (A) 12/13/2019 1258   APPEARANCEUR CLOUDY (A) 12/13/2019 1258   LABSPEC 1.017 12/13/2019 1258   PHURINE 5.0 12/13/2019 1258   GLUCOSEU NEGATIVE 12/13/2019 1258   HGBUR NEGATIVE 12/13/2019 1258   [place] 12/13/2019 1258   BILIRUBINUR neg 12/17/2016 1538   KETONESUR NEGATIVE 12/13/2019 1258   PROTEINUR 30 (A) 12/13/2019 1258   UROBILINOGEN 0.2 12/17/2016 1538   UROBILINOGEN 0.2 05/29/2010 2100   NITRITE NEGATIVE 12/13/2019 1258   LEUKOCYTESUR LARGE (A) 12/13/2019 1258    Radiological Exams on Admission: US Venous Img Upper Uni Left (DVT)  Result Date: 01/15/2020 CLINICAL DATA:  Upper extremity pain and swelling for 2 days EXAM: LEFT UPPER EXTREMITY VENOUS DOPPLER ULTRASOUND TECHNIQUE: Gray-scale sonography with graded compression, as well as color Doppler and duplex ultrasound were performed to evaluate the upper extremity deep venous system from the level of the subclavian vein and including the jugular, axillary, basilic, radial, ulnar and upper cephalic vein. Spectral Doppler was utilized to evaluate flow at rest and with distal augmentation maneuvers. COMPARISON:  None. FINDINGS: Contralateral Subclavian Vein: Respiratory phasicity is normal and symmetric with the symptomatic side. No evidence of thrombus. Normal compressibility. Internal Jugular Vein: No evidence of thrombus. Normal compressibility, respiratory phasicity and response to augmentation. Subclavian Vein: No evidence of thrombus. Normal compressibility, respiratory phasicity and response to augmentation. Axillary Vein: No evidence of thrombus. Normal compressibility, respiratory phasicity and response to augmentation. Cephalic Vein: No evidence of thrombus. Normal compressibility, respiratory phasicity and response to augmentation. Basilic Vein: No evidence of thrombus. Normal compressibility, respiratory phasicity and response to augmentation. Brachial Veins: No evidence  of thrombus. Normal compressibility, respiratory phasicity and response to augmentation. Radial Veins: No evidence of thrombus. Normal compressibility, respiratory phasicity and response to augmentation. Ulnar Veins: No evidence of thrombus. Normal compressibility, respiratory phasicity and response to augmentation. Venous Reflux:  None visualized. Other Findings: Basilic dialysis fistula is well visualized and within normal limits. No thrombus is noted within. IMPRESSION: No evidence of DVT within the left upper extremity. Patent left basilic transposition fistula. Electronically Signed   By: Inez Catalina M.D.   On: 01/15/2020 19:19      Assessment/Plan Cellulitis of the left AV fistula site Start IV Rocephin Will need vascular surgery to evaluate before dialysis  ESRD on HD Tues/Thurs/Sat Patient will need vascular surgery consult tomorrow prior to dialysis session Creatinine stable near baseline  Insulin-dependent type 2 diabetes Start with sensitive sliding scale  Chronic anemia of CKD Stable  Diastolic CHF Appears euvolemic  Morbid obesity BMI greater than 40  DVT prophylaxis: SCD  code Status: Full Family Communication: Plan discussed with patient at bedside  disposition Plan: Home with at least 2 midnight stays  Consults called:  Admission status: inpatient     Status is: Inpatient  Remains inpatient appropriate because:IV treatments appropriate due to intensity of illness or inability to take PO   Dispo: The patient is from: Home  Anticipated d/c is to: Home              Anticipated d/c date is: 3 days              Patient currently is not medically stable to d/c.         Orene Desanctis DO [facility]   If 7PM-7AM, please contact night-coverage [email or web address]   01/16/2020, 12:30 AM

## 2020-01-16 NOTE — Progress Notes (Signed)
[facility], [place] 01/16/20  Subjective:   LOS: 0  Patient known to our practice from previous admissions.  She currently dialyzes at [facility] on Tuesday, Thursday, Saturday schedule. She states that she underwent dialysis Saturday.  At home later in the day, she noticed bruising and some swelling near her access. Yesterday she noticed pain in her dialysis access therefore she came to the emergency room for evaluation   Objective:  Vital signs in last 24 hours:  Temp:  [97.4 F (36.3 C)-98.5 F (36.9 C)] 97.6 F (36.4 C) (07/06 3295) Pulse Rate:  [77-86] 85 (07/06 0633) Resp:  [18-24] 24 (07/06 1884) BP: (101-147)/(46-75) 110/56 (07/06 0633) SpO2:  [92 %-100 %] 96 % (07/06 1660) Weight:  [131.1 kg] 131.1 kg (07/05 1719)  Weight change:  Filed Weights   01/15/20 1719  Weight: 131.1 kg    Intake/Output:    Intake/Output Summary (Last 24 hours) at 01/16/2020 1231 Last data filed at 01/16/2020 0813 Gross per 24 hour  Intake 220 ml  Output 0 ml  Net 220 ml     Physical Exam: General:  No acute distress, laying in the bed  HEENT  anicteric, moist oral mucous membrane  Pulm/lungs  normal breathing effort  CVS/Heart  regular, no rub  Abdomen:   Soft, nontender  Extremities:  No edema  Neurologic:  Alert, oriented  Skin:  Some bruising near AV fistula from infiltration  Access:  Left arm AV fistula, good bruit       Basic Metabolic Panel:  Recent Labs  Lab 01/15/20 1726 01/16/20 0438  NA 131* 132*  K 4.9 5.1  CL 94* 95*  CO2 23 25  GLUCOSE 293* 331*  BUN 69* 73*  CREATININE 8.97* 9.82*  CALCIUM 8.7* 8.3*     CBC: Recent Labs  Lab 01/15/20 1726 01/16/20 0438  WBC 6.8 6.7  NEUTROABS 4.7  --   HGB 10.2* 9.6*  HCT 31.6* 29.1*  MCV 95.2 94.5  PLT 160 158      Lab Results  Component Value Date   HEPBSAG Negative 04/04/2019      Microbiology:  Recent Results (from the past 240 hour(s))  SARS  Coronavirus 2 by RT PCR (hospital order, performed in [place] hospital lab) Nasopharyngeal Nasopharyngeal Swab     Status: None   Collection Time: 01/16/20  1:23 AM   Specimen: Nasopharyngeal Swab  Result Value Ref Range Status   SARS Coronavirus 2 NEGATIVE NEGATIVE Final    Comment: (NOTE) SARS-CoV-2 target nucleic acids are NOT DETECTED.  The SARS-CoV-2 RNA is generally detectable in upper and lower respiratory specimens during the acute phase of infection. The lowest concentration of SARS-CoV-2 viral copies this assay can detect is 250 copies / mL. A negative result does not preclude SARS-CoV-2 infection and should not be used as the sole basis for treatment or other patient management decisions.  A negative result may occur with improper specimen collection / handling, submission of specimen other than nasopharyngeal swab, presence of viral mutation(s) within the areas targeted by this assay, and inadequate number of viral copies (<250 copies / mL). A negative result must be combined with clinical observations, patient history, and epidemiological information.  Fact Sheet for Patients:   StrictlyIdeas.no  Fact Sheet for Healthcare Providers: BankingDealers.co.za  This test is not yet approved or  cleared by the [place] FDA and has been authorized for detection and/or diagnosis of SARS-CoV-2 by FDA under an Emergency Use Authorization (  EUA).  This EUA will remain in effect (meaning this test can be used) for the duration of the COVID-19 declaration under Section 564(b)(1) of the Act, 21 U.S.C. section 360bbb-3(b)(1), unless the authorization is terminated or revoked sooner.  Performed at [facility], [address] [place], [place] [address]     Coagulation Studies: No results for input(s): LABPROT, INR in the last 72 hours.  Urinalysis: No results for input(s): COLORURINE, LABSPEC, PHURINE, GLUCOSEU,  HGBUR, BILIRUBINUR, KETONESUR, PROTEINUR, UROBILINOGEN, NITRITE, LEUKOCYTESUR in the last 72 hours.  Invalid input(s): APPERANCEUR    Imaging: US Venous Img Upper Uni Left (DVT)  Result Date: 01/15/2020 CLINICAL DATA:  Upper extremity pain and swelling for 2 days EXAM: LEFT UPPER EXTREMITY VENOUS DOPPLER ULTRASOUND TECHNIQUE: Gray-scale sonography with graded compression, as well as color Doppler and duplex ultrasound were performed to evaluate the upper extremity deep venous system from the level of the subclavian vein and including the jugular, axillary, basilic, radial, ulnar and upper cephalic vein. Spectral Doppler was utilized to evaluate flow at rest and with distal augmentation maneuvers. COMPARISON:  None. FINDINGS: Contralateral Subclavian Vein: Respiratory phasicity is normal and symmetric with the symptomatic side. No evidence of thrombus. Normal compressibility. Internal Jugular Vein: No evidence of thrombus. Normal compressibility, respiratory phasicity and response to augmentation. Subclavian Vein: No evidence of thrombus. Normal compressibility, respiratory phasicity and response to augmentation. Axillary Vein: No evidence of thrombus. Normal compressibility, respiratory phasicity and response to augmentation. Cephalic Vein: No evidence of thrombus. Normal compressibility, respiratory phasicity and response to augmentation. Basilic Vein: No evidence of thrombus. Normal compressibility, respiratory phasicity and response to augmentation. Brachial Veins: No evidence of thrombus. Normal compressibility, respiratory phasicity and response to augmentation. Radial Veins: No evidence of thrombus. Normal compressibility, respiratory phasicity and response to augmentation. Ulnar Veins: No evidence of thrombus. Normal compressibility, respiratory phasicity and response to augmentation. Venous Reflux:  None visualized. Other Findings: Basilic dialysis fistula is well visualized and within normal limits.  No thrombus is noted within. IMPRESSION: No evidence of DVT within the left upper extremity. Patent left basilic transposition fistula. Electronically Signed   By: Inez Catalina M.D.   On: 01/15/2020 19:19     Medications:   . cefTRIAXone (ROCEPHIN)  IV Stopped (01/16/20 0217)   . atorvastatin  80 mg Oral Daily  . Chlorhexidine Gluconate Cloth  6 each Topical Q0600  . insulin aspart  0-6 Units Subcutaneous TID AC & HS  . lidocaine-prilocaine   Topical Once     Assessment/ Plan:  64 y.o. female with end stage renal disease on hemodialysis, diabetes mellitus type II insulin dependent, hypertension, coronary artery disease, AAA, chronic hepatitis C, peptic ulcer, obstructive sleep apnea  was admitted on 01/15/2020 for  Principal Problem:   Cellulitis Active Problems:   Diabetes mellitus type 2, uncontrolled, with complications (HCC)   Morbid (severe) obesity due to excess calories (HCC)   Chronic diastolic heart failure (HCC)   ESRD (end stage renal disease) on dialysis ([place])   Anemia due to end stage renal disease ([place])  Cellulitis [L03.90] ESRD (end stage renal disease) on dialysis ([place]) [N18.6, Z99.2] Pain and swelling of left upper extremity [B01.751, M79.89]  #. ESRD with infiltration of dialysis access Patient received normal hemodialysis on Saturday.  We will continue her usual hemodialysis today. Ultrasound of the left arm access showed basilic fistula within normal limits.  No thrombus We will attempt cannulation of AV fistula for dialysis today.  If successful, patient can be discharged   #.  Anemia of CKD  Lab Results  Component Value Date   HGB 9.6 (L) 01/16/2020   Low dose EPO with HD  #. Secondary hyperparathyroidism of renal origin N 25.81      Component Value Date/Time   PTH 232 (H) 08/16/2017 0501   Lab Results  Component Value Date   PHOS 7.0 (H) 11/06/2019   Monitor calcium and phos level during this admission Continue home regimen of binders with  meals Lorin Picket)  #. Diabetes type 2 with CKD Hgb A1c MFr Bld (%)  Date Value  01/16/2020 8.7 (H)     LOS: 0 Dusty Raczkowski 7/6/202112:31 PM  [facility] [place], [place]

## 2020-01-16 NOTE — Care Management CC44 (Signed)
Condition Code 44 Documentation Completed  Patient Details  Name: [name] MRN: [id number] Date of Birth: 22-Jan-1956   Condition Code 44 given:  Yes Patient signature on Condition Code 44 notice:  Yes Documentation of 2 MD's agreement:  Yes Code 44 added to claim:  Yes    Candie Chroman, LCSW 01/16/2020, 4:50 PM

## 2020-01-18 ENCOUNTER — Telehealth: Payer: Medicare HMO

## 2020-01-18 ENCOUNTER — Ambulatory Visit: Payer: Self-pay

## 2020-01-18 DIAGNOSIS — M79622 Pain in left upper arm: Secondary | ICD-10-CM | POA: Insufficient documentation

## 2020-01-18 DIAGNOSIS — N186 End stage renal disease: Secondary | ICD-10-CM | POA: Diagnosis not present

## 2020-01-18 DIAGNOSIS — Z992 Dependence on renal dialysis: Secondary | ICD-10-CM | POA: Diagnosis not present

## 2020-01-18 DIAGNOSIS — N2581 Secondary hyperparathyroidism of renal origin: Secondary | ICD-10-CM | POA: Diagnosis not present

## 2020-01-18 NOTE — Chronic Care Management (AMB) (Signed)
  Chronic Care Management   Outreach Note  01/18/2020 Name: ANIYLA HARLING MRN: [id number] DOB: 1956/02/03  Primary Care Provider: [name], PA-C Reason for referral : Chronic Care Management   An unsuccessful telephone outreach was attempted today. Ms. Teschner was referred to the case management team for assistance with care management and care coordination.  A HIPAA compliant voice message was left today requesting a return call.     Follow Up Plan: The care management team will reach out to Ms. Madara again within the next two weeks.   Horris Latino [facility] Care Management [phone]

## 2020-01-19 ENCOUNTER — Telehealth: Payer: Self-pay

## 2020-01-19 DIAGNOSIS — Z01419 Encounter for gynecological examination (general) (routine) without abnormal findings: Secondary | ICD-10-CM

## 2020-01-19 NOTE — Telephone Encounter (Signed)
Copied from [place] [id number]. Topic: Referral - Request for Referral >> Jan 19, 2020 11:35 AM Erick Blinks wrote: Has patient seen PCP for this complaint? No. *If NO, is insurance requiring patient see PCP for this issue before PCP can refer them? Referral for which specialty: Gynecology  Preferred provider/office: [facility] recommended in [place]  Reason for referral:

## 2020-01-19 NOTE — Telephone Encounter (Signed)
referral placed

## 2020-01-19 NOTE — Telephone Encounter (Signed)
Per CRM message:  Pt would like to increase her dose of Lyrica.  It is okay to increase or does she need to be seen first?  Thanks,   -Mickel Baas

## 2020-01-19 NOTE — Telephone Encounter (Signed)
She cannot take gabapentin.  I would recommend a referral to neurology for her neuropathy and pain.

## 2020-01-19 NOTE — Telephone Encounter (Signed)
She is asking if you can prescribed something better than Lyrica or similar to Lyrica that would work better?

## 2020-01-19 NOTE — Telephone Encounter (Signed)
She is on medically highest dose

## 2020-01-20 DIAGNOSIS — N2581 Secondary hyperparathyroidism of renal origin: Secondary | ICD-10-CM | POA: Diagnosis not present

## 2020-01-20 DIAGNOSIS — N186 End stage renal disease: Secondary | ICD-10-CM | POA: Diagnosis not present

## 2020-01-20 DIAGNOSIS — Z992 Dependence on renal dialysis: Secondary | ICD-10-CM | POA: Diagnosis not present

## 2020-01-21 IMAGING — DX DG CHEST 1V PORT
1 series · 1 of 1 positions shown · non-contrast
Comparison: 09/15/2017 and 09/14/2017 radiographs.

CLINICAL DATA: Shortness of breath with body swelling for 1 week.
History of diabetes.

EXAM:
PORTABLE CHEST 1 VIEW

[chest ap]
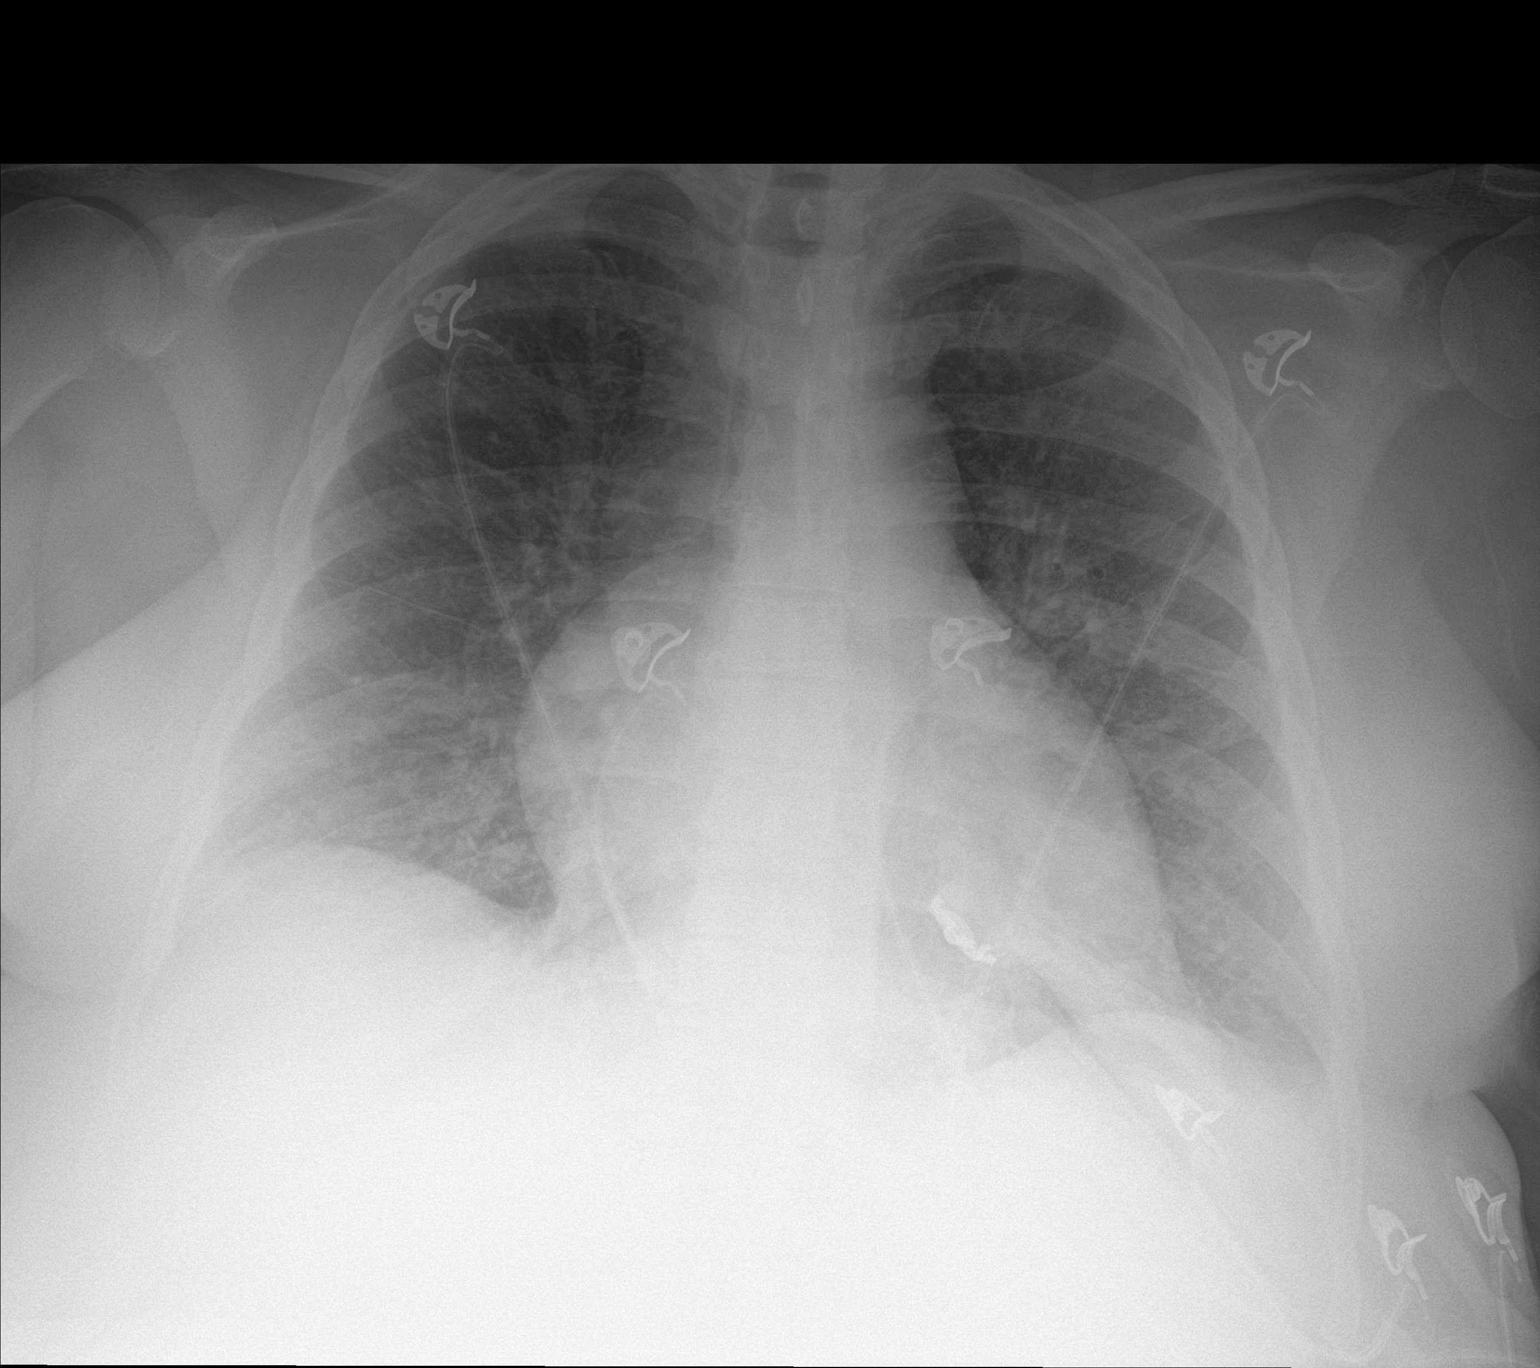

[1 of 1 positions shown; findings below may reference images not displayed]

FINDINGS: 7922 hours. The heart size is at the upper limits of normal for
portable AP technique. There are mild residual ground-glass
opacities throughout the lungs, improved from the previous studies.
There is no confluent airspace opacity, pleural effusion or
pneumothorax. The bones appear unremarkable. Telemetry leads overlie
the chest.
IMPRESSION: Mild residual or recurrent ground-glass pulmonary opacities,
improved from earlier this month and likely edema. No new findings.

## 2020-01-22 NOTE — Telephone Encounter (Signed)
LMTCB -if patient calls back ok for [facility] nurse to relay message

## 2020-01-23 DIAGNOSIS — Z992 Dependence on renal dialysis: Secondary | ICD-10-CM | POA: Diagnosis not present

## 2020-01-23 DIAGNOSIS — N2581 Secondary hyperparathyroidism of renal origin: Secondary | ICD-10-CM | POA: Diagnosis not present

## 2020-01-23 DIAGNOSIS — N186 End stage renal disease: Secondary | ICD-10-CM | POA: Diagnosis not present

## 2020-01-23 NOTE — Telephone Encounter (Signed)
Attempted to call pt.  Left vm. to return call to the office.

## 2020-01-25 ENCOUNTER — Encounter: Payer: Self-pay | Admitting: Physician Assistant

## 2020-01-25 ENCOUNTER — Ambulatory Visit (INDEPENDENT_AMBULATORY_CARE_PROVIDER_SITE_OTHER): Payer: Medicare HMO | Admitting: Physician Assistant

## 2020-01-25 ENCOUNTER — Other Ambulatory Visit: Payer: Self-pay

## 2020-01-25 VITALS — BP 137/74 | HR 84 | Temp 97.0°F | Resp 16 | Wt 293.0 lb

## 2020-01-25 DIAGNOSIS — B182 Chronic viral hepatitis C: Secondary | ICD-10-CM

## 2020-01-25 DIAGNOSIS — E1142 Type 2 diabetes mellitus with diabetic polyneuropathy: Secondary | ICD-10-CM

## 2020-01-25 DIAGNOSIS — K140 Glossitis: Secondary | ICD-10-CM

## 2020-01-25 DIAGNOSIS — Z794 Long term (current) use of insulin: Secondary | ICD-10-CM | POA: Diagnosis not present

## 2020-01-25 DIAGNOSIS — N186 End stage renal disease: Secondary | ICD-10-CM

## 2020-01-25 DIAGNOSIS — M25561 Pain in right knee: Secondary | ICD-10-CM | POA: Diagnosis not present

## 2020-01-25 DIAGNOSIS — R300 Dysuria: Secondary | ICD-10-CM

## 2020-01-25 DIAGNOSIS — M25562 Pain in left knee: Secondary | ICD-10-CM

## 2020-01-25 DIAGNOSIS — N2581 Secondary hyperparathyroidism of renal origin: Secondary | ICD-10-CM | POA: Diagnosis not present

## 2020-01-25 DIAGNOSIS — B370 Candidal stomatitis: Secondary | ICD-10-CM | POA: Diagnosis not present

## 2020-01-25 DIAGNOSIS — E1122 Type 2 diabetes mellitus with diabetic chronic kidney disease: Secondary | ICD-10-CM

## 2020-01-25 DIAGNOSIS — Z992 Dependence on renal dialysis: Secondary | ICD-10-CM | POA: Diagnosis not present

## 2020-01-25 DIAGNOSIS — G8929 Other chronic pain: Secondary | ICD-10-CM

## 2020-01-25 MED ORDER — TRAMADOL HCL 50 MG PO TABS: 50.0000 mg | ORAL_TABLET | Freq: Three times a day (TID) | ORAL | 0 refills | Status: DC | PRN

## 2020-01-25 MED ORDER — CEPHALEXIN 500 MG PO CAPS: 500.0000 mg | ORAL_CAPSULE | Freq: Two times a day (BID) | ORAL | 0 refills | Status: DC

## 2020-01-25 MED ORDER — NYSTATIN 100000 UNIT/ML MT SUSP: 5.0000 mL | Freq: Four times a day (QID) | OROMUCOSAL | 0 refills | Status: DC

## 2020-01-25 NOTE — Progress Notes (Signed)
Established patient visit   Patient: [name]   DOB: 25-Nov-1955   64 y.o. Female  MRN: [id number] Visit Date: 01/25/2020  Today's healthcare provider: [name], PA-C   Chief Complaint  Patient presents with  . Knee Pain  . Cold Feet  . Dysuria   Subjective    HPI  Patient here with c/o feet feeling "frozen" with pins and needles.This has been going on for quite a few months. Does have known neuropathy and is on max dose lyrica. Unable to tolerate gabapentin in the past.  She c/o pain in her knees. Reports that she has some shooting pains in them when she stands up. This has been going on for while but worsening in the past few months. Right knee >left knee. Has had knee replacement in the left knee.   She also c/o dysuria. This has been going on for 4 weeks. It hurts to urinates every time she urinates. It has a bad odor to it. No fever. No hematuria.   Patient Active Problem List   Diagnosis Date Noted  . Cellulitis 01/16/2020  . Anemia due to end stage renal disease ([place]) 01/16/2020  . Diabetic polyneuropathy associated with type 2 diabetes mellitus ([place]) 11/06/2019  . Hemodialysis status ([place]) 11/06/2019  . Nystagmus 11/06/2019  . Intermittent claudication ([place]) 11/06/2019  . Vertigo of central origin 11/06/2019  . Weakness   . Hypervolemia associated with renal insufficiency 10/31/2019  . Ascending aortic aneurysm ([place]) 03/27/2019  . Puncture wound without foreign body of left forearm, initial encounter 01/02/2019  . Non-healing wound of upper extremity 12/28/2018  . Unspecified open wound of left upper arm, initial encounter 12/06/2018  . Complication from renal dialysis device 10/20/2018  . Iron deficiency anemia, unspecified 10/18/2018  . ESRD (end stage renal disease) on dialysis ([place]) 06/01/2018  . ARF (acute renal failure) ([place]) 05/09/2018  . Colon cancer screening   . LGSIL on Pap smear of cervix 01/27/2018  . Gout 12/25/2017  . AKI (acute  kidney injury) ([place]) 12/24/2017  . Hyperglycemia 12/24/2017  . Chronic diastolic heart failure ([place]) 09/30/2017  . Lymphedema 09/30/2017  . Aortic stenosis   . Chronic pain of right knee 02/12/2017  . Chronic gout due to renal impairment of multiple sites without tophus 02/12/2017  . Esophageal dysphagia 02/12/2017  . Osteoarthritis 01/22/2017  . Diabetic hyperosmolar non-ketotic state ([place]) 12/13/2016  . CAD (coronary artery disease) 12/13/2016  . Hyperlipidemia 12/13/2016  . Hyperphosphatemia 12/13/2016  . Asthma 11/29/2015  . Depression 09/05/2015  . GERD (gastroesophageal reflux disease) 03/25/2015  . Environmental allergies 03/14/2015  . SOB (shortness of breath) 02/15/2015  . Chronic hepatitis C without hepatic coma ([place]) 01/31/2015  . Diabetic neuropathy ([place]) 01/31/2015  . OSA (obstructive sleep apnea) 01/01/2015  . Poor dentition 11/13/2014  . Essential hypertension 10/08/2014  . Morbid (severe) obesity due to excess calories ([place]) 10/08/2014  . Diabetes mellitus type 2, uncontrolled, with complications ([place]) 35/70/1779    Class: Chronic   Past Medical History:  Diagnosis Date  . Anemia   . Aortic stenosis    Echo 8/18: mean 13, peak 28, LVOT/AV mean velocity 0.51  . Arthritis   . Asthma    As a child   . Bronchitis   . CAD (coronary artery disease)    a. 09/2016: 50% Ost 1st Mrg stenosis, 50% 2nd Mrg stenosis, 20% Mid-Cx, 95% Prox LAD, 40% mid-LAD, and 10% dist-LAD stenosis. Staged PCI with DES to Prox-LAD.   [name]  Chronic combined systolic and diastolic CHF (congestive heart failure) ([place]) 2011   echo 2/18: EF 55-60, normal wall motion, grade 2 diastolic dysfunction, trivial AI // echo 3/18: Septal and apical HK, EF 45-50, normal wall motion, trivial AI, mild LAE, PASP 38 // echo 8/18: EF 60-65, normal wall motion, grade 1 diastolic dysfunction, calcified aortic valve leaflets, mild aortic stenosis (mean 13, peak 28, LVOT/AV mean velocity 0.51), mild AI, moderate MAC,  mild LAE, trivial TR   . Chronic kidney disease    STAGE 4  . Chronic kidney disease on chronic dialysis (HCC)    t, th, sat  . Complication of anesthesia   . Depression   . Diabetes mellitus Dx 1989  . Elevated lipids   . GERD (gastroesophageal reflux disease)   . Gout   . Heart murmur    asymptomatic  . Hepatitis C Dx 2013  . Hypertension Dx 1989  . Infected surgical wound    Lt arm  . Myocardial infarction ([place]) 07/2015  . Obesity   . Pancreatitis 2013  . Pneumonia   . Refusal of blood transfusions as patient is Jehovah's Witness   . Tendinitis   . Tremors of nervous system    LEFT HAND  . Ulcer 2010       Medications: Outpatient Medications Prior to Visit  Medication Sig  . allopurinol (ZYLOPRIM) 100 MG tablet TAKE 1 TABLET BY MOUTH ONCE DAILY.  [name] aspirin EC 81 MG EC tablet Take 1 tablet (81 mg total) by mouth daily.  [name] atorvastatin (LIPITOR) 80 MG tablet TAKE 1 TABLET BY MOUTH ONCE DAILY AT 6PM  . AURYXIA 1 GM 210 MG(Fe) tablet Take 420 mg by mouth 3 (three) times daily with meals.   [name] b complex-vitamin c-folic acid (NEPHRO-VITE) 0.8 MG TABS tablet Take 1 tablet by mouth daily.   . carvedilol (COREG) 25 MG tablet TAKE (1) TABLET BY MOUTH TWICE A DAY WITH MEALS (BREAKFAST AND SUPPER)  . Cinacalcet HCl (SENSIPAR PO) Take 30 mg by mouth every dialysis.  [name] cyclobenzaprine (FLEXERIL) 5 MG tablet Take 1 tablet (5 mg total) by mouth 3 (three) times daily as needed for muscle spasms.  [name] dicyclomine (BENTYL) 20 MG tablet TAKE 1 TABLET(20 MG) BY MOUTH FOUR TIMES DAILY BEFORE MEALS AND AT BEDTIME  . docusate sodium (COLACE) 100 MG capsule Take 1 capsule (100 mg total) by mouth 2 (two) times daily as needed for mild constipation.  [name] doxepin (SINEQUAN) 10 MG capsule Take 1 capsule (10 mg total) by mouth 4 (four) times daily as needed. For itching  . DULERA 200-5 MCG/ACT AERO Inhale 2 puffs into the lungs 2 (two) times daily as needed.   . fluticasone (FLONASE) 50 MCG/ACT nasal  spray Place 2 sprays into both nostrils daily as needed for allergies or rhinitis.  . fluticasone furoate-vilanterol (BREO ELLIPTA) 200-25 MCG/INH AEPB Inhale 1 puff into the lungs daily. (Patient taking differently: Inhale 1 puff into the lungs daily as needed. )  . gentamicin cream (GARAMYCIN) 0.1 % Apply 1 application topically 2 (two) times daily. (Patient taking differently: Apply 1 application topically 2 (two) times daily as needed. )  . hydrALAZINE (APRESOLINE) 100 MG tablet TAKE (1) TABLET BY MOUTH THREE TIMES DAILY (Patient taking differently: Take 100 mg by mouth 3 (three) times daily. )  . hydrOXYzine (ATARAX/VISTARIL) 25 MG tablet Take 25 mg by mouth 4 (four) times daily.  [name] LEVEMIR FLEXTOUCH 100 UNIT/ML FlexPen INJECT 60 UNITS UNDER THE SKIN TWICE DAILY  .  lidocaine-prilocaine (EMLA) cream Apply 1 application topically as needed.  . liraglutide (VICTOZA) 18 MG/3ML SOPN Start with 0.19m daily and increase by 0.633mper week until max dose of 1.8 mg dose achieved. (Patient taking differently: Inject 0.6-1.8 mg into the skin See admin instructions. Start with 0.28m628maily and increase by 0.8mg728mr week until max dose of 1.2mg 54me achieved.)  . LOPERAMIDE HCL PO Take 1 tablet by mouth as needed.   . loratadine (CLARITIN) 10 MG tablet TAKE 1 TABLET BY MOUTH ONCE DAILY. (Patient taking differently: Take 10 mg by mouth at bedtime. )  . mometasone (ELOCON) 0.1 % cream APPLY AS DIRECTED TO AFFECTED AREA ONCE DAILY. (Patient taking differently: Apply 1 application topically daily as needed. )  . montelukast (SINGULAIR) 10 MG tablet TAKE ONE TABLET BY MOUTH AT BEDTIME. (Patient taking differently: Take 10 mg by mouth at bedtime. )  . nitroGLYCERIN (NITROSTAT) 0.4 MG SL tablet Place 1 tablet (0.4 mg total) under the tongue every 5 (five) minutes x 3 doses as needed for chest pain.  . NOVMarland KitchenLOG FLEXPEN 100 UNIT/ML FlexPen USE 25 UNITS UNDER THE SKIN THREE TIMES DAILY WITH MEALS (Patient taking  differently: Inject 25 Units into the skin 3 (three) times daily with meals. )  . nystatin cream (MYCOSTATIN) Apply 1 application topically 2 (two) times daily. (Patient taking differently: Apply 1 application topically daily as needed for dry skin. )  . omeprazole (PRILOSEC) 20 MG capsule TAKE (1) CAPSULE BY MOUTH ONCE DAILY. (Patient taking differently: Take 20 mg by mouth every evening. )  . pregabalin (LYRICA) 150 MG capsule Take 1 capsule (150 mg total) by mouth 2 (two) times daily.  . SENNA PLUS 8.6-50 MG tablet TAKE ONE TABLET BY MOUTH AT BEDTIME. (Patient taking differently: Take 1 tablet by mouth at bedtime as needed. )  . silver sulfADIAZINE (SILVADENE) 1 % cream Apply 1 application topically daily. Apply to right first toe (Patient taking differently: Apply 1 application topically daily as needed (pain). )  . torsemide (DEMADEX) 20 MG tablet TAKE (2) TABLETS BY MOUTH TWICE DAILY. (Patient taking differently: Take 40 mg by mouth 2 (two) times daily. )  . triamcinolone cream (KENALOG) 0.1 % APPLY TO AFFECTED AREA TWICE DAILY (Patient taking differently: 2 (two) times daily as needed. )  . valACYclovir (VALTREX) 500 MG tablet TAKE (1) TABLET BY MOUTH EVERY OTHER DAY.  . VitMarland Kitchenmin D, Ergocalciferol, (DRISDOL) 1.25 MG (50000 UT) CAPS capsule TAKE 1 CAPSULE BY MOUTH ONCE A MONTH (Patient taking differently: Take 50,000 Units by mouth every 30 (thirty) days. )  . [DISCONTINUED] oxyCODONE-acetaminophen (PERCOCET/ROXICET) 5-325 MG tablet Take 1 tablet by mouth every 8 (eight) hours as needed for severe pain.  . [DISCONTINUED] traMADol (ULTRAM) 50 MG tablet Take 1 tablet (50 mg total) by mouth 2 (two) times daily as needed.  . albMarland Kitchenterol (VENTOLIN HFA) 108 (90 Base) MCG/ACT inhaler Inhale 2 puffs into the lungs every 4 (four) hours as needed for wheezing or shortness of breath.   No facility-administered medications prior to visit.    Review of Systems  Constitutional: Positive for fatigue. Negative  for fever.  Respiratory: Negative for cough, chest tightness and shortness of breath.   Cardiovascular: Negative for chest pain, palpitations and leg swelling.  Gastrointestinal: Negative for abdominal pain, constipation, diarrhea and nausea.  Genitourinary: Positive for dysuria (and foul odor) and pelvic pain. Negative for hematuria.  Musculoskeletal: Positive for arthralgias, back pain, gait problem and myalgias.  Neurological: Positive for weakness and  numbness.    Last CBC Lab Results  Component Value Date   WBC 6.7 01/16/2020   HGB 9.6 (L) 01/16/2020   HCT 29.1 (L) 01/16/2020   MCV 94.5 01/16/2020   MCH 31.2 01/16/2020   RDW 16.1 (H) 01/16/2020   PLT 158 19/62/2297   Last metabolic panel Lab Results  Component Value Date   GLUCOSE 331 (H) 01/16/2020   NA 132 (L) 01/16/2020   K 5.1 01/16/2020   CL 95 (L) 01/16/2020   CO2 25 01/16/2020   BUN 73 (H) 01/16/2020   CREATININE 9.82 (H) 01/16/2020   GFRNONAA 4 (L) 01/16/2020   GFRAA 4 (L) 01/16/2020   CALCIUM 8.3 (L) 01/16/2020   PHOS 7.0 (H) 11/06/2019   PROT 7.4 01/15/2020   ALBUMIN 3.4 (L) 01/15/2020   LABGLOB 3.9 03/18/2017   AGRATIO 0.8 03/18/2017   BILITOT 0.7 01/15/2020   ALKPHOS 295 (H) 01/15/2020   AST 35 01/15/2020   ALT 43 01/15/2020   ANIONGAP 12 01/16/2020      Objective    BP 137/74 (BP Location: Left Arm, Patient Position: Sitting, Cuff Size: Large)   Pulse 84   Temp (!) 97 F (36.1 C) (Temporal)   Resp 16   Wt 293 lb (132.9 kg)   BMI 44.55 kg/m  BP Readings from Last 3 Encounters:  01/25/20 137/74  01/16/20 (!) 106/52  12/13/19 130/60   Wt Readings from Last 3 Encounters:  01/25/20 293 lb (132.9 kg)  01/15/20 289 lb (131.1 kg)  12/13/19 286 lb (129.7 kg)      Physical Exam Vitals reviewed.  Constitutional:      General: She is not in acute distress.    Appearance: Normal appearance. She is well-developed. She is obese. She is not ill-appearing or diaphoretic.  HENT:     Head:  Normocephalic and atraumatic.  Eyes:     General: No scleral icterus.    Extraocular Movements: Extraocular movements intact.  Neck:     Thyroid: No thyromegaly.     Vascular: No JVD.     Trachea: No tracheal deviation.  Cardiovascular:     Rate and Rhythm: Normal rate and regular rhythm.     Pulses: Normal pulses.     Heart sounds: Normal heart sounds. No murmur heard.  No friction rub. No gallop.   Pulmonary:     Effort: Pulmonary effort is normal. No respiratory distress.     Breath sounds: Normal breath sounds. No wheezing or rales.  Musculoskeletal:     Cervical back: Normal range of motion and neck supple.     Right lower leg: Edema present.     Left lower leg: Edema present.  Neurological:     Mental Status: She is alert.      No results found for any visits on 01/25/20.  Assessment & Plan     1. Dysuria Patient unable to give a urine specimen today. Will treat empirically with keflex as below. Call if worsening.  - cephALEXin (KEFLEX) 500 MG capsule; Take 1 capsule (500 mg total) by mouth 2 (two) times daily.  Dispense: 14 capsule; Refill: 0  2. Chronic hepatitis C without hepatic coma (HCC) Stable.   3. Type 2 diabetes mellitus with diabetic polyneuropathy, with long-term current use of insulin (HCC) Worsening. Followed by Endocrinology for diabetes. On max dose Lyrica with worsening neuropathy. Referral to neurology as below. - Ambulatory referral to Neurology - traMADol (ULTRAM) 50 MG tablet; Take 1 tablet (50 mg total) by mouth  every 8 (eight) hours as needed.  Dispense: 90 tablet; Refill: 0  4. Type 2 diabetes mellitus with chronic kidney disease on chronic dialysis, with long-term current use of insulin ([place]) Followed by Endocrinology.   5. Thrush Noted on tongue. Will give Nystatin as below. Call if worsening.  - nystatin (MYCOSTATIN) 100000 UNIT/ML suspension; Take 5 mLs (500,000 Units total) by mouth 4 (four) times daily.  Dispense: 100 mL; Refill:  0  6. Glossitis Suspect glossitis from thrush as well as chronic medical issues. Using Nystatin. Discussed Biotene lozenges for dry mouth.  - Ambulatory referral to Neurology  7. Chronic pain of both knees Worsening. Discussed weight loss. Referral to ortho for further evaluation. Tramadol increased to be allowed every 8 hrs instead of BID for better pain management.  - Ambulatory referral to Orthopedic Surgery - traMADol (ULTRAM) 50 MG tablet; Take 1 tablet (50 mg total) by mouth every 8 (eight) hours as needed.  Dispense: 90 tablet; Refill: 0   No follow-ups on file.      Reynolds Bowl, PA-C, have reviewed all documentation for this visit. The documentation on 01/26/20 for the exam, diagnosis, procedures, and orders are all accurate and complete.   Rubye Beach  [facility] [phone] (phone) [phone] (fax)  [place]

## 2020-01-26 ENCOUNTER — Encounter: Payer: Self-pay | Admitting: Physician Assistant

## 2020-01-26 DIAGNOSIS — Z992 Dependence on renal dialysis: Secondary | ICD-10-CM | POA: Diagnosis not present

## 2020-01-26 DIAGNOSIS — N2581 Secondary hyperparathyroidism of renal origin: Secondary | ICD-10-CM | POA: Diagnosis not present

## 2020-01-26 DIAGNOSIS — N186 End stage renal disease: Secondary | ICD-10-CM | POA: Diagnosis not present

## 2020-01-26 NOTE — Patient Instructions (Signed)
Diabetic Neuropathy Diabetic neuropathy refers to nerve damage that is caused by diabetes (diabetes mellitus). Over time, people with diabetes can develop nerve damage throughout the body. There are several types of diabetic neuropathy:  Peripheral neuropathy. This is the most common type of diabetic neuropathy. It causes damage to nerves that carry signals between the spinal cord and other parts of the body (peripheral nerves). This usually affects nerves in the feet and legs first, and may eventually affect the hands and arms. The damage affects the ability to sense touch or temperature.  Autonomic neuropathy. This type causes damage to nerves that control involuntary functions (autonomic nerves). These nerves carry signals that control: ? Heartbeat. ? Body temperature. ? Blood pressure. ? Urination. ? Digestion. ? Sweating. ? Sexual function. ? Response to changing blood sugar (glucose) levels.  Focal neuropathy. This type of nerve damage affects one area of the body, such as an arm, a leg, or the face. The injury may involve one nerve or a small group of nerves. Focal neuropathy can be painful and unpredictable, and occurs most often in older adults with diabetes. This often develops suddenly, but usually improves over time and does not cause long-term problems.  Proximal neuropathy. This type of nerve damage affects the nerves of the thighs, hips, buttocks, or legs. It causes severe pain, weakness, and muscle death (atrophy), usually in the thigh muscles. It is more common among older men and people who have type 2 diabetes. The length of recovery time may vary. What are the causes? Peripheral, autonomic, and focal neuropathies are caused by diabetes that is not well controlled with treatment. The cause of proximal neuropathy is not known, but it may be caused by inflammation related to uncontrolled blood glucose levels. What are the signs or symptoms? Peripheral neuropathy Peripheral  neuropathy develops slowly over time. When the nerves of the feet and legs no longer work, you may experience:  Burning, stabbing, or aching pain in the legs or feet.  Pain or cramping in the legs or feet.  Loss of feeling (numbness) and inability to feel pressure or pain in the feet. This can lead to: ? Thick calluses or sores on areas of constant pressure. ? Ulcers. ? Reduced ability to feel temperature changes.  Foot deformities.  Muscle weakness.  Loss of balance or coordination. Autonomic neuropathy The symptoms of autonomic neuropathy vary depending on which nerves are affected. Symptoms may include:  Problems with digestion, such as: ? Nausea or vomiting. ? Poor appetite. ? Bloating. ? Diarrhea or constipation. ? Trouble swallowing. ? Losing weight without trying to.  Problems with the heart, blood and lungs, such as: ? Dizziness, especially when standing up. ? Fainting. ? Shortness of breath. ? Irregular heartbeat.  Bladder problems, such as: ? Trouble starting or stopping urination. ? Leaking urine. ? Trouble emptying the bladder. ? Urinary tract infections (UTIs).  Problems with other body functions, such as: ? Sweat. You may sweat too much or too little. ? Temperature. You might get hot easily. Or, you might feel cold more than usual. ? Sexual function. Men may not be able to get or maintain an erection. Women may have vaginal dryness and difficulty with arousal. Focal neuropathy Symptoms affect only one area of the body. Common symptoms include:  Numbness.  Tingling.  Burning pain.  Prickling feeling.  Very sensitive skin.  Weakness.  Inability to move (paralysis).  Muscle twitching.  Muscles getting smaller (wasting).  Poor coordination.  Double or blurred vision. Proximal   neuropathy  Sudden, severe pain in the hip, thigh, or buttocks. Pain may spread from the back into the legs (sciatica).  Pain and numbness in the arms and  legs.  Tingling.  Loss of bladder control or bowel control.  Weakness and wasting of thigh muscles.  Difficulty getting up from a seated position.  Abdominal swelling.  Unexplained weight loss. How is this diagnosed? Diagnosis usually involves reviewing your medical history and any symptoms you have. Diagnosis varies depending on the type of neuropathy your health care provider suspects. Peripheral neuropathy Your health care provider will check areas that are affected by your nervous system (neurologic exam), such as your reflexes, how you move, and what you can feel. You may have other tests, such as:  Blood tests.  Removal and examination of fluid that surrounds the spinal cord (lumbar puncture).  CT scan.  MRI.  A test to check the nerves that control muscles (electromyogram, EMG).  Tests of how quickly messages pass through your nerves (nerve conduction velocity tests).  Removal of a small piece of nerve to be examined under a microscope (biopsy). Autonomic neuropathy You may have tests, such as:  Tests to measure your blood pressure and heart rate. This may include monitoring you while you are safely secured to an exam table that moves you from a lying position to an upright position (table tilt test).  Breathing tests to check your lungs.  Tests to check how food moves through the digestive system (gastric emptying tests).  Blood, sweat, or urine tests.  Ultrasound of your bladder.  Spinal fluid tests. Focal neuropathy This condition may be diagnosed with:  A neurologic exam.  CT scan.  MRI.  EMG.  Nerve conduction velocity tests. Proximal neuropathy There is no test to diagnose this type of neuropathy. You may have tests to rule out other possible causes of this type of neuropathy. Tests may include:  X-rays of your spine and lumbar region.  Lumbar puncture.  MRI. How is this treated? The goal of treatment is to keep nerve damage from getting  worse. The most important part of treatment is keeping your blood glucose level and your A1C level within your target range by following your diabetes management plan. Over time, maintaining lower blood glucose levels helps lessen symptoms. In some cases, you may need prescription pain medicine. Follow these instructions at home:  Lifestyle   Do not use any products that contain nicotine or tobacco, such as cigarettes and e-cigarettes. If you need help quitting, ask your health care provider.  Be physically active every day. Include strength training and balance exercises.  Follow a healthy meal plan.  Work with your health care provider to manage your blood pressure. General instructions  Follow your diabetes management plan as directed. ? Check your blood glucose levels as directed by your health care provider. ? Keep your blood glucose in your target range as directed by your health care provider. ? Have your A1C level checked at least two times a year, or as often as told by your health care provider.  Take over the counter and prescription medicines only as told by your health care provider. This includes insulin and diabetes medicine.  Do not drive or use heavy machinery while taking prescription pain medicines.  Check your skin and feet every day for cuts, bruises, redness, blisters, or sores.  Keep all follow up visits as told by your health care provider. This is important. Contact a health care provider if:  You have burning, stabbing, or aching pain in your legs or feet.  You are unable to feel pressure or pain in your feet.  You develop problems with digestion, such as: ? Nausea. ? Vomiting. ? Bloating. ? Constipation. ? Diarrhea. ? Abdominal pain.  You have difficulty with urination, such as inability: ? To control when you urinate (incontinence). ? To completely empty the bladder (retention).  You have palpitations.  You feel dizzy, weak, or faint when you  stand up. Get help right away if:  You cannot urinate.  You have sudden weakness or loss of coordination.  You have trouble speaking.  You have pain or pressure in your chest.  You have an irregular heart beat.  You have sudden inability to move a part of your body. Summary  Diabetic neuropathy refers to nerve damage that is caused by diabetes. It can affect nerves throughout the entire body, causing numbness and pain in the arms, legs, digestive tract, heart, and other body systems.  Keep your blood glucose level and your blood pressure in your target range, as directed by your health care provider. This can help prevent neuropathy from getting worse.  Check your skin and feet every day for cuts, bruises, redness, blisters, or sores.  Do not use any products that contain nicotine or tobacco, such as cigarettes and e-cigarettes. If you need help quitting, ask your health care provider. This information is not intended to replace advice given to you by your health care provider. Make sure you discuss any questions you have with your health care provider. Document Revised: 08/11/2017 Document Reviewed: 08/03/2016 Elsevier Patient Education  2020 [employer]  

## 2020-01-26 NOTE — Telephone Encounter (Signed)
Patient was seen in the office yesterday and reported that she had already ben advised of this. I didn't see documentation and still told her what the provider had said.

## 2020-01-29 ENCOUNTER — Telehealth: Payer: Self-pay

## 2020-01-29 NOTE — Telephone Encounter (Signed)
Copied from [place] [id number]. Topic: General - Other >> Jan 29, 2020  3:24 PM [name] wrote: Reason for CRM: Pt called and is requesting to speak with PCP regarding the pain in her knee. She states that the pain worse. Please advise .

## 2020-01-30 ENCOUNTER — Other Ambulatory Visit: Payer: Self-pay

## 2020-01-30 ENCOUNTER — Emergency Department: Payer: Medicare HMO

## 2020-01-30 ENCOUNTER — Inpatient Hospital Stay
Admission: EM | Admit: 2020-01-30 | Discharge: 2020-02-01 | DRG: 291 | Disposition: A | Discharge status: Home / Self Care | Payer: Medicare HMO | Attending: Internal Medicine | Admitting: Internal Medicine

## 2020-01-30 DIAGNOSIS — Z7951 Long term (current) use of inhaled steroids: Secondary | ICD-10-CM

## 2020-01-30 DIAGNOSIS — M1A39X0 Chronic gout due to renal impairment, multiple sites, without tophus (tophi): Secondary | ICD-10-CM | POA: Diagnosis present

## 2020-01-30 DIAGNOSIS — E1122 Type 2 diabetes mellitus with diabetic chronic kidney disease: Secondary | ICD-10-CM | POA: Diagnosis not present

## 2020-01-30 DIAGNOSIS — N2581 Secondary hyperparathyroidism of renal origin: Secondary | ICD-10-CM | POA: Diagnosis not present

## 2020-01-30 DIAGNOSIS — E1142 Type 2 diabetes mellitus with diabetic polyneuropathy: Secondary | ICD-10-CM | POA: Diagnosis present

## 2020-01-30 DIAGNOSIS — E785 Hyperlipidemia, unspecified: Secondary | ICD-10-CM | POA: Diagnosis not present

## 2020-01-30 DIAGNOSIS — N186 End stage renal disease: Secondary | ICD-10-CM | POA: Diagnosis not present

## 2020-01-30 DIAGNOSIS — G4733 Obstructive sleep apnea (adult) (pediatric): Secondary | ICD-10-CM | POA: Diagnosis present

## 2020-01-30 DIAGNOSIS — J45909 Unspecified asthma, uncomplicated: Secondary | ICD-10-CM | POA: Diagnosis present

## 2020-01-30 DIAGNOSIS — E118 Type 2 diabetes mellitus with unspecified complications: Secondary | ICD-10-CM

## 2020-01-30 DIAGNOSIS — Z833 Family history of diabetes mellitus: Secondary | ICD-10-CM

## 2020-01-30 DIAGNOSIS — R531 Weakness: Secondary | ICD-10-CM | POA: Diagnosis not present

## 2020-01-30 DIAGNOSIS — Z20822 Contact with and (suspected) exposure to covid-19: Secondary | ICD-10-CM | POA: Diagnosis not present

## 2020-01-30 DIAGNOSIS — Z955 Presence of coronary angioplasty implant and graft: Secondary | ICD-10-CM

## 2020-01-30 DIAGNOSIS — Z6841 Body Mass Index (BMI) 40.0 and over, adult: Secondary | ICD-10-CM

## 2020-01-30 DIAGNOSIS — Z91013 Allergy to seafood: Secondary | ICD-10-CM

## 2020-01-30 DIAGNOSIS — I350 Nonrheumatic aortic (valve) stenosis: Secondary | ICD-10-CM | POA: Diagnosis not present

## 2020-01-30 DIAGNOSIS — I132 Hypertensive heart and chronic kidney disease with heart failure and with stage 5 chronic kidney disease, or end stage renal disease: Secondary | ICD-10-CM | POA: Diagnosis not present

## 2020-01-30 DIAGNOSIS — Z800 Family history of malignant neoplasm of digestive organs: Secondary | ICD-10-CM

## 2020-01-30 DIAGNOSIS — R739 Hyperglycemia, unspecified: Secondary | ICD-10-CM | POA: Diagnosis present

## 2020-01-30 DIAGNOSIS — Z8249 Family history of ischemic heart disease and other diseases of the circulatory system: Secondary | ICD-10-CM

## 2020-01-30 DIAGNOSIS — M79605 Pain in left leg: Secondary | ICD-10-CM | POA: Diagnosis not present

## 2020-01-30 DIAGNOSIS — M25562 Pain in left knee: Secondary | ICD-10-CM | POA: Diagnosis not present

## 2020-01-30 DIAGNOSIS — Z87891 Personal history of nicotine dependence: Secondary | ICD-10-CM

## 2020-01-30 DIAGNOSIS — R52 Pain, unspecified: Secondary | ICD-10-CM

## 2020-01-30 DIAGNOSIS — Z794 Long term (current) use of insulin: Secondary | ICD-10-CM

## 2020-01-30 DIAGNOSIS — I2510 Atherosclerotic heart disease of native coronary artery without angina pectoris: Secondary | ICD-10-CM | POA: Diagnosis not present

## 2020-01-30 DIAGNOSIS — N08 Glomerular disorders in diseases classified elsewhere: Secondary | ICD-10-CM | POA: Diagnosis present

## 2020-01-30 DIAGNOSIS — R0600 Dyspnea, unspecified: Secondary | ICD-10-CM

## 2020-01-30 DIAGNOSIS — I5031 Acute diastolic (congestive) heart failure: Secondary | ICD-10-CM | POA: Diagnosis not present

## 2020-01-30 DIAGNOSIS — E11649 Type 2 diabetes mellitus with hypoglycemia without coma: Secondary | ICD-10-CM | POA: Diagnosis present

## 2020-01-30 DIAGNOSIS — E1165 Type 2 diabetes mellitus with hyperglycemia: Secondary | ICD-10-CM | POA: Diagnosis not present

## 2020-01-30 DIAGNOSIS — Z9115 Patient's noncompliance with renal dialysis: Secondary | ICD-10-CM | POA: Diagnosis not present

## 2020-01-30 DIAGNOSIS — Z7982 Long term (current) use of aspirin: Secondary | ICD-10-CM

## 2020-01-30 DIAGNOSIS — N289 Disorder of kidney and ureter, unspecified: Secondary | ICD-10-CM

## 2020-01-30 DIAGNOSIS — M1A9XX0 Chronic gout, unspecified, without tophus (tophi): Secondary | ICD-10-CM | POA: Diagnosis present

## 2020-01-30 DIAGNOSIS — E8770 Fluid overload, unspecified: Secondary | ICD-10-CM | POA: Diagnosis present

## 2020-01-30 DIAGNOSIS — Z79899 Other long term (current) drug therapy: Secondary | ICD-10-CM

## 2020-01-30 DIAGNOSIS — M7989 Other specified soft tissue disorders: Secondary | ICD-10-CM | POA: Diagnosis not present

## 2020-01-30 DIAGNOSIS — I10 Essential (primary) hypertension: Secondary | ICD-10-CM | POA: Diagnosis not present

## 2020-01-30 DIAGNOSIS — D631 Anemia in chronic kidney disease: Secondary | ICD-10-CM

## 2020-01-30 DIAGNOSIS — I120 Hypertensive chronic kidney disease with stage 5 chronic kidney disease or end stage renal disease: Secondary | ICD-10-CM | POA: Diagnosis not present

## 2020-01-30 DIAGNOSIS — I252 Old myocardial infarction: Secondary | ICD-10-CM

## 2020-01-30 DIAGNOSIS — K219 Gastro-esophageal reflux disease without esophagitis: Secondary | ICD-10-CM | POA: Diagnosis not present

## 2020-01-30 DIAGNOSIS — M25561 Pain in right knee: Secondary | ICD-10-CM | POA: Diagnosis not present

## 2020-01-30 DIAGNOSIS — Z992 Dependence on renal dialysis: Secondary | ICD-10-CM

## 2020-01-30 DIAGNOSIS — Z888 Allergy status to other drugs, medicaments and biological substances status: Secondary | ICD-10-CM

## 2020-01-30 DIAGNOSIS — M1711 Unilateral primary osteoarthritis, right knee: Secondary | ICD-10-CM | POA: Diagnosis not present

## 2020-01-30 DIAGNOSIS — E1100 Type 2 diabetes mellitus with hyperosmolarity without nonketotic hyperglycemic-hyperosmolar coma (NKHHC): Secondary | ICD-10-CM

## 2020-01-30 DIAGNOSIS — R601 Generalized edema: Secondary | ICD-10-CM | POA: Diagnosis not present

## 2020-01-30 DIAGNOSIS — M25461 Effusion, right knee: Secondary | ICD-10-CM | POA: Diagnosis not present

## 2020-01-30 LAB — BASIC METABOLIC PANEL
Anion gap: 18 — ABNORMAL HIGH (ref 5–15)
BUN: 111 mg/dL — ABNORMAL HIGH (ref 8–23)
CO2: 19 mmol/L — ABNORMAL LOW (ref 22–32)
Calcium: 8.4 mg/dL — ABNORMAL LOW (ref 8.9–10.3)
Chloride: 94 mmol/L — ABNORMAL LOW (ref 98–111)
Creatinine, Ser: 12.29 mg/dL — ABNORMAL HIGH (ref 0.44–1.00)
GFR calc Af Amer: 3 mL/min — ABNORMAL LOW (ref >60)
GFR calc non Af Amer: 3 mL/min — ABNORMAL LOW (ref >60)
Glucose, Bld: 449 mg/dL — ABNORMAL HIGH (ref 70–99)
Potassium: 5.1 mmol/L (ref 3.5–5.1)
Sodium: 131 mmol/L — ABNORMAL LOW (ref 135–145)

## 2020-01-30 LAB — GLUCOSE, CAPILLARY
Glucose-Capillary: 174 mg/dL — ABNORMAL HIGH (ref 70–99)
Glucose-Capillary: 78 mg/dL (ref 70–99)

## 2020-01-30 LAB — CBC
HCT: 27.9 % — ABNORMAL LOW (ref 36.0–46.0)
Hemoglobin: 9.3 g/dL — ABNORMAL LOW (ref 12.0–15.0)
MCH: 31.6 pg (ref 26.0–34.0)
MCHC: 33.3 g/dL (ref 30.0–36.0)
MCV: 94.9 fL (ref 80.0–100.0)
Platelets: 145 10*3/uL — ABNORMAL LOW (ref 150–400)
RBC: 2.94 MIL/uL — ABNORMAL LOW (ref 3.87–5.11)
RDW: 15.7 % — ABNORMAL HIGH (ref 11.5–15.5)
WBC: 7.2 10*3/uL (ref 4.0–10.5)
nRBC: 0 % (ref 0.0–0.2)

## 2020-01-30 LAB — SARS CORONAVIRUS 2 BY RT PCR (HOSPITAL ORDER, PERFORMED IN ~~LOC~~ HOSPITAL LAB): SARS Coronavirus 2: NEGATIVE

## 2020-01-30 MED ORDER — PANTOPRAZOLE SODIUM 40 MG PO TBEC
40.0000 mg | DELAYED_RELEASE_TABLET | Freq: Every day | ORAL | Status: DC
  Administered 2020-01-31 – 2020-02-01 (×2): 40 mg via ORAL
  Filled 2020-01-30 (×2): qty 1

## 2020-01-30 MED ORDER — VALACYCLOVIR HCL 500 MG PO TABS
500.0000 mg | ORAL_TABLET | ORAL | Status: DC
  Administered 2020-01-31: 500 mg via ORAL
  Filled 2020-01-30: qty 1

## 2020-01-30 MED ORDER — SODIUM CHLORIDE 0.9% FLUSH: 3.0000 mL | Freq: Two times a day (BID) | INTRAVENOUS | Status: DC

## 2020-01-30 MED ORDER — ACETAMINOPHEN 650 MG RE SUPP: 650.0000 mg | Freq: Four times a day (QID) | RECTAL | Status: DC | PRN

## 2020-01-30 MED ORDER — SODIUM CHLORIDE 0.9% FLUSH: 3.0000 mL | INTRAVENOUS | Status: DC | PRN

## 2020-01-30 MED ORDER — DOCUSATE SODIUM 100 MG PO CAPS
100.0000 mg | ORAL_CAPSULE | Freq: Two times a day (BID) | ORAL | Status: DC
  Administered 2020-01-31 – 2020-02-01 (×3): 100 mg via ORAL
  Filled 2020-01-30 (×3): qty 1

## 2020-01-30 MED ORDER — DICYCLOMINE HCL 20 MG PO TABS
20.0000 mg | ORAL_TABLET | Freq: Three times a day (TID) | ORAL | Status: DC
  Administered 2020-01-31 – 2020-02-01 (×5): 20 mg via ORAL
  Filled 2020-01-30 (×9): qty 1

## 2020-01-30 MED ORDER — ONDANSETRON HCL 4 MG/2ML IJ SOLN: 4.0000 mg | Freq: Four times a day (QID) | INTRAMUSCULAR | Status: DC | PRN

## 2020-01-30 MED ORDER — ASPIRIN EC 81 MG PO TBEC
81.0000 mg | DELAYED_RELEASE_TABLET | Freq: Every day | ORAL | Status: DC
  Administered 2020-01-31 – 2020-02-01 (×2): 81 mg via ORAL
  Filled 2020-01-30 (×2): qty 1

## 2020-01-30 MED ORDER — SODIUM CHLORIDE 0.9 % IV SOLN: 250.0000 mL | INTRAVENOUS | Status: DC | PRN

## 2020-01-30 MED ORDER — INSULIN DETEMIR 100 UNIT/ML ~~LOC~~ SOLN
60.0000 [IU] | Freq: Every day | SUBCUTANEOUS | Status: DC
  Administered 2020-01-31 (×2): 60 [IU] via SUBCUTANEOUS
  Filled 2020-01-30 (×3): qty 0.6

## 2020-01-30 MED ORDER — ONDANSETRON HCL 4 MG PO TABS: 4.0000 mg | ORAL_TABLET | Freq: Four times a day (QID) | ORAL | Status: DC | PRN

## 2020-01-30 MED ORDER — MONTELUKAST SODIUM 10 MG PO TABS
10.0000 mg | ORAL_TABLET | Freq: Every day | ORAL | Status: DC
  Administered 2020-01-31 (×2): 10 mg via ORAL
  Filled 2020-01-30 (×2): qty 1

## 2020-01-30 MED ORDER — HYDROCODONE-ACETAMINOPHEN 5-325 MG PO TABS
1.0000 | ORAL_TABLET | ORAL | Status: DC | PRN
  Administered 2020-01-31 (×3): 2 via ORAL
  Filled 2020-01-30 (×3): qty 2

## 2020-01-30 MED ORDER — ATORVASTATIN CALCIUM 20 MG PO TABS
80.0000 mg | ORAL_TABLET | Freq: Every day | ORAL | Status: DC
  Administered 2020-01-31: 80 mg via ORAL
  Filled 2020-01-30: qty 4

## 2020-01-30 MED ORDER — ALBUTEROL SULFATE (2.5 MG/3ML) 0.083% IN NEBU: 3.0000 mL | INHALATION_SOLUTION | RESPIRATORY_TRACT | Status: DC | PRN

## 2020-01-30 MED ORDER — ACETAMINOPHEN 325 MG PO TABS: 650.0000 mg | ORAL_TABLET | Freq: Four times a day (QID) | ORAL | Status: DC | PRN

## 2020-01-30 MED ORDER — HYDRALAZINE HCL 50 MG PO TABS
100.0000 mg | ORAL_TABLET | Freq: Three times a day (TID) | ORAL | Status: DC
  Administered 2020-01-31 – 2020-02-01 (×4): 100 mg via ORAL
  Filled 2020-01-30 (×4): qty 2

## 2020-01-30 MED ORDER — SODIUM CHLORIDE 0.9% FLUSH: 3.0000 mL | Freq: Once | INTRAVENOUS | Status: DC

## 2020-01-30 MED ORDER — LORATADINE 10 MG PO TABS
10.0000 mg | ORAL_TABLET | Freq: Every day | ORAL | Status: DC
  Administered 2020-01-31 (×2): 10 mg via ORAL
  Filled 2020-01-30 (×2): qty 1

## 2020-01-30 MED ORDER — CARVEDILOL 25 MG PO TABS
25.0000 mg | ORAL_TABLET | Freq: Two times a day (BID) | ORAL | Status: DC
  Administered 2020-01-31 – 2020-02-01 (×2): 25 mg via ORAL
  Filled 2020-01-30 (×2): qty 1

## 2020-01-30 MED ORDER — FERRIC CITRATE 1 GM 210 MG(FE) PO TABS
420.0000 mg | ORAL_TABLET | Freq: Three times a day (TID) | ORAL | Status: DC
  Administered 2020-01-31 – 2020-02-01 (×4): 420 mg via ORAL
  Filled 2020-01-30 (×7): qty 2

## 2020-01-30 MED ORDER — MOMETASONE FURO-FORMOTEROL FUM 200-5 MCG/ACT IN AERO
2.0000 | INHALATION_SPRAY | Freq: Two times a day (BID) | RESPIRATORY_TRACT | Status: DC
  Administered 2020-01-31 – 2020-02-01 (×3): 2 via RESPIRATORY_TRACT
  Filled 2020-01-30: qty 8.8

## 2020-01-30 MED ORDER — PREGABALIN 75 MG PO CAPS
150.0000 mg | ORAL_CAPSULE | Freq: Two times a day (BID) | ORAL | Status: DC
  Administered 2020-01-31 (×2): 150 mg via ORAL
  Filled 2020-01-30 (×2): qty 2

## 2020-01-30 MED ORDER — INSULIN ASPART 100 UNIT/ML ~~LOC~~ SOLN
8.0000 [IU] | Freq: Once | SUBCUTANEOUS | Status: AC
  Administered 2020-01-30: 8 [IU] via INTRAVENOUS
  Filled 2020-01-30: qty 1

## 2020-01-30 MED ORDER — ALLOPURINOL 100 MG PO TABS
100.0000 mg | ORAL_TABLET | Freq: Every day | ORAL | Status: DC
  Administered 2020-01-31 – 2020-02-01 (×2): 100 mg via ORAL
  Filled 2020-01-30 (×2): qty 1

## 2020-01-30 MED ORDER — INSULIN ASPART 100 UNIT/ML ~~LOC~~ SOLN
0.0000 [IU] | SUBCUTANEOUS | Status: DC
  Administered 2020-01-31: 2 [IU] via SUBCUTANEOUS
  Administered 2020-01-31: 6 [IU] via SUBCUTANEOUS
  Administered 2020-01-31: 3 [IU] via SUBCUTANEOUS
  Administered 2020-02-01 (×2): 6 [IU] via SUBCUTANEOUS
  Filled 2020-01-30 (×5): qty 1

## 2020-01-30 MED ORDER — TRAMADOL HCL 50 MG PO TABS
50.0000 mg | ORAL_TABLET | Freq: Three times a day (TID) | ORAL | Status: DC | PRN
  Administered 2020-02-01: 50 mg via ORAL
  Filled 2020-01-30 (×2): qty 1

## 2020-01-30 MED ORDER — HYDROMORPHONE HCL 1 MG/ML IJ SOLN
1.0000 mg | Freq: Once | INTRAMUSCULAR | Status: AC
  Administered 2020-01-30: 1 mg via INTRAMUSCULAR
  Filled 2020-01-30: qty 1

## 2020-01-30 NOTE — ED Provider Notes (Signed)
[facility] Emergency Department Provider Note  Time seen: 5:48 PM  I have reviewed the triage vital signs and the nursing notes.   HISTORY  Chief Complaint Leg Pain and Weakness   HPI [name] is a 64 y.o. female with a past medical history of anemia, asthma, CAD, ESRD on HD Tuesday/Thursday/Saturday, diabetes, hypertension, presents to the emergency department with complaints of leg pain and swelling.  According to the patient for the past several days she has noticed increased swelling in the lower extremities left greater than right along with pain in the legs.  Patient states she is having trouble sleeping and ambulating due to the pain in her legs.  Patient states she missed her dialysis today due to the pain.  Last had dialysis on Friday.  Patient states she had Friday dialysis because she knew she was going to miss her Saturday dialysis this past weekend.  Denies shortness of breath.  No chest pain.   Patient does state a sense of generalized fatigue/weakness today.  Past Medical History:  Diagnosis Date  . Anemia   . Aortic stenosis    Echo 8/18: mean 13, peak 28, LVOT/AV mean velocity 0.51  . Arthritis   . Asthma    As a child   . Bronchitis   . CAD (coronary artery disease)    a. 09/2016: 50% Ost 1st Mrg stenosis, 50% 2nd Mrg stenosis, 20% Mid-Cx, 95% Prox LAD, 40% mid-LAD, and 10% dist-LAD stenosis. Staged PCI with DES to Prox-LAD.   [name] Chronic combined systolic and diastolic CHF (congestive heart failure) ([place]) 2011   echo 2/18: EF 55-60, normal wall motion, grade 2 diastolic dysfunction, trivial AI // echo 3/18: Septal and apical HK, EF 45-50, normal wall motion, trivial AI, mild LAE, PASP 38 // echo 8/18: EF 60-65, normal wall motion, grade 1 diastolic dysfunction, calcified aortic valve leaflets, mild aortic stenosis (mean 13, peak 28, LVOT/AV mean velocity 0.51), mild AI, moderate MAC, mild LAE, trivial TR   . Chronic kidney disease    STAGE 4   . Chronic kidney disease on chronic dialysis (HCC)    t, th, sat  . Complication of anesthesia   . Depression   . Diabetes mellitus Dx 1989  . Elevated lipids   . GERD (gastroesophageal reflux disease)   . Gout   . Heart murmur    asymptomatic  . Hepatitis C Dx 2013  . Hypertension Dx 1989  . Infected surgical wound    Lt arm  . Myocardial infarction ([place]) 07/2015  . Obesity   . Pancreatitis 2013  . Pneumonia   . Refusal of blood transfusions as patient is Jehovah's Witness   . Tendinitis   . Tremors of nervous system    LEFT HAND  . Ulcer 2010    Patient Active Problem List   Diagnosis Date Noted  . Cellulitis 01/16/2020  . Anemia due to end stage renal disease ([place]) 01/16/2020  . Diabetic polyneuropathy associated with type 2 diabetes mellitus ([place]) 11/06/2019  . Hemodialysis status ([place]) 11/06/2019  . Nystagmus 11/06/2019  . Intermittent claudication ([place]) 11/06/2019  . Vertigo of central origin 11/06/2019  . Weakness   . Hypervolemia associated with renal insufficiency 10/31/2019  . Ascending aortic aneurysm ([place]) 03/27/2019  . Puncture wound without foreign body of left forearm, initial encounter 01/02/2019  . Non-healing wound of upper extremity 12/28/2018  . Unspecified open wound of left upper arm, initial encounter 12/06/2018  . Complication from renal dialysis  device 10/20/2018  . Iron deficiency anemia, unspecified 10/18/2018  . ESRD (end stage renal disease) on dialysis ([place]) 06/01/2018  . ARF (acute renal failure) ([place]) 05/09/2018  . Colon cancer screening   . LGSIL on Pap smear of cervix 01/27/2018  . Gout 12/25/2017  . AKI (acute kidney injury) ([place]) 12/24/2017  . Hyperglycemia 12/24/2017  . Chronic diastolic heart failure ([place]) 09/30/2017  . Lymphedema 09/30/2017  . Aortic stenosis   . Chronic pain of right knee 02/12/2017  . Chronic gout due to renal impairment of multiple sites without tophus 02/12/2017  . Esophageal dysphagia 02/12/2017  .  Osteoarthritis 01/22/2017  . Diabetic hyperosmolar non-ketotic state ([place]) 12/13/2016  . CAD (coronary artery disease) 12/13/2016  . Hyperlipidemia 12/13/2016  . Hyperphosphatemia 12/13/2016  . Asthma 11/29/2015  . Depression 09/05/2015  . GERD (gastroesophageal reflux disease) 03/25/2015  . Environmental allergies 03/14/2015  . SOB (shortness of breath) 02/15/2015  . Chronic hepatitis C without hepatic coma ([place]) 01/31/2015  . Diabetic neuropathy ([place]) 01/31/2015  . OSA (obstructive sleep apnea) 01/01/2015  . Poor dentition 11/13/2014  . Essential hypertension 10/08/2014  . Morbid (severe) obesity due to excess calories ([place]) 10/08/2014  . Diabetes mellitus type 2, uncontrolled, with complications ([place]) 67/20/9470    Class: Chronic    Past Surgical History:  Procedure Laterality Date  . A/V FISTULAGRAM Left 04/11/2019   Procedure: A/V FISTULAGRAM;  Surgeon: Katha Cabal, MD;  Location: [place] [facility];  Service: Cardiovascular;  Laterality: Left;  . A/V FISTULAGRAM Left 06/02/2019   Procedure: A/V FISTULAGRAM;  Surgeon: Katha Cabal, MD;  Location: [place] [facility];  Service: Cardiovascular;  Laterality: Left;  . APPLICATION OF WOUND VAC Left 08/14/2017   Procedure: APPLICATION OF WOUND VAC Exchange;  Surgeon: Robert Bellow, MD;  Location: [facility];  Service: General;  Laterality: Left;  . APPLICATION OF WOUND VAC Left 12/21/2018   Procedure: APPLICATION OF WOUND VAC;  Surgeon: Katha Cabal, MD;  Location: [facility];  Service: Vascular;  Laterality: Left;  . AV FISTULA PLACEMENT Left 08/19/2018   Procedure: ARTERIOVENOUS (AV) FISTULA CREATION ( BRACHIOBASILIC );  Surgeon: Katha Cabal, MD;  Location: [facility];  Service: Vascular;  Laterality: Left;  . BASCILIC VEIN TRANSPOSITION Left 11/18/2018   Procedure: BASCILIC VEIN TRANSPOSITION;  Surgeon: Katha Cabal, MD;  Location: [facility];  Service: Vascular;  Laterality: Left;  . CHOLECYSTECTOMY    .  COLONOSCOPY WITH PROPOFOL N/A 02/03/2018   Procedure: COLONOSCOPY WITH PROPOFOL;  Surgeon: Lin Landsman, MD;  Location: [facility];  Service: Gastroenterology;  Laterality: N/A;  . CORONARY ANGIOPLASTY  07/2015   STENT  . CORONARY STENT INTERVENTION N/A 09/18/2016   Procedure: Coronary Stent Intervention;  Surgeon: Troy Sine, MD;  Location: [place] [facility];  Service: Cardiovascular;  Laterality: N/A;  . DIALYSIS/PERMA CATHETER INSERTION N/A 05/10/2018   Procedure: DIALYSIS/PERMA CATHETER INSERTION;  Surgeon: Katha Cabal, MD;  Location: [place] [facility];  Service: Cardiovascular;  Laterality: N/A;  . DRESSING CHANGE UNDER ANESTHESIA Left 08/15/2017   Procedure: exploration of wound for bleeding;  Surgeon: Robert Bellow, MD;  Location: [facility];  Service: General;  Laterality: Left;  . ESOPHAGOGASTRODUODENOSCOPY (EGD) WITH PROPOFOL N/A 02/03/2018   Procedure: ESOPHAGOGASTRODUODENOSCOPY (EGD) WITH PROPOFOL;  Surgeon: Lin Landsman, MD;  Location: [facility];  Service: Gastroenterology;  Laterality: N/A;  . EYE SURGERY  11/17/2018  . INCISION AND DRAINAGE ABSCESS Left 08/12/2017   Procedure: INCISION AND DRAINAGE  ABSCESS;  Surgeon: Robert Bellow, MD;  Location: [facility];  Service: General;  Laterality: Left;  . KNEE ARTHROSCOPY    . LEFT HEART CATH N/A 09/18/2016   Procedure: Left Heart Cath;  Surgeon: Troy Sine, MD;  Location: [place];  Service: Cardiovascular;  Laterality: N/A;  . LEFT HEART CATH AND CORONARY ANGIOGRAPHY N/A 09/16/2016   Procedure: Left Heart Cath and Coronary Angiography;  Surgeon: Burnell Blanks, MD;  Location: [facility];  Service: Cardiovascular;  Laterality: N/A;  . LEFT HEART CATH AND CORONARY ANGIOGRAPHY N/A 04/29/2017   Procedure: LEFT HEART CATH AND CORONARY ANGIOGRAPHY;  Surgeon: Nelva Bush, MD;  Location: [place] [facility];  Service: Cardiovascular;  Laterality: N/A;  . LOWER EXTREMITY  ANGIOGRAPHY Right 03/08/2018   Procedure: LOWER EXTREMITY ANGIOGRAPHY;  Surgeon: Katha Cabal, MD;  Location: [place];  Service: Cardiovascular;  Laterality: Right;  . TUBAL LIGATION    . TUBAL LIGATION    . UPPER EXTREMITY ANGIOGRAPHY Right 09/19/2019   Procedure: UPPER EXTREMITY ANGIOGRAPHY;  Surgeon: Katha Cabal, MD;  Location: [place] [facility];  Service: Cardiovascular;  Laterality: Right;  . WOUND DEBRIDEMENT Left 12/21/2018   Procedure: DEBRIDEMENT WOUND;  Surgeon: Katha Cabal, MD;  Location: [facility];  Service: Vascular;  Laterality: Left;  . WOUND DEBRIDEMENT Left 12/30/2018   Procedure: DEBRIDEMENT WOUND WITH VAC PLACEMENT (LEFT UPPER EXTREMITY);  Surgeon: Katha Cabal, MD;  Location: [facility];  Service: Vascular;  Laterality: Left;    Prior to Admission medications   Medication Sig Start Date End Date Taking? Authorizing Provider  albuterol (VENTOLIN HFA) 108 (90 Base) MCG/ACT inhaler Inhale 2 puffs into the lungs every 4 (four) hours as needed for wheezing or shortness of breath. 10/21/19 12/26/19  Janeece Fitting, PA-C  allopurinol (ZYLOPRIM) 100 MG tablet TAKE 1 TABLET BY MOUTH ONCE DAILY. 12/08/19   [name], PA-C  aspirin EC 81 MG EC tablet Take 1 tablet (81 mg total) by mouth daily. 09/19/16   Strader, Fransisco Hertz, PA-C  atorvastatin (LIPITOR) 80 MG tablet TAKE 1 TABLET BY MOUTH ONCE DAILY AT 6PM 11/09/19   [name], PA-C  AURYXIA 1 GM 210 MG(Fe) tablet Take 420 mg by mouth 3 (three) times daily with meals.  10/10/18   [provider]  b complex-vitamin c-folic acid (NEPHRO-VITE) 0.8 MG TABS tablet Take 1 tablet by mouth daily.     [provider]  carvedilol (COREG) 25 MG tablet TAKE (1) TABLET BY MOUTH TWICE A DAY WITH MEALS (BREAKFAST AND SUPPER) 11/09/19   Burnette, Clearnce Sorrel, PA-C  cephALEXin (KEFLEX) 500 MG capsule Take 1 capsule (500 mg total) by mouth 2 (two) times daily. 01/25/20   [name],  PA-C  Cinacalcet HCl (SENSIPAR PO) Take 30 mg by mouth every dialysis. 01/04/20 01/02/21  [provider]  cyclobenzaprine (FLEXERIL) 5 MG tablet Take 1 tablet (5 mg total) by mouth 3 (three) times daily as needed for muscle spasms. 08/10/19   [name], PA-C  dicyclomine (BENTYL) 20 MG tablet TAKE 1 TABLET(20 MG) BY MOUTH FOUR TIMES DAILY BEFORE MEALS AND AT BEDTIME 10/16/19   [name], PA-C  docusate sodium (COLACE) 100 MG capsule Take 1 capsule (100 mg total) by mouth 2 (two) times daily as needed for mild constipation. 09/20/17   Gouru, Illene Silver, MD  doxepin (SINEQUAN) 10 MG capsule Take 1 capsule (10 mg total) by mouth 4 (four) times  daily as needed. For itching 11/07/19   [name], PA-C  DULERA 200-5 MCG/ACT AERO Inhale 2 puffs into the lungs 2 (two) times daily as needed.  11/26/19   [provider]  fluticasone (FLONASE) 50 MCG/ACT nasal spray Place 2 sprays into both nostrils daily as needed for allergies or rhinitis. 09/28/17   [name], PA-C  fluticasone furoate-vilanterol (BREO ELLIPTA) 200-25 MCG/INH AEPB Inhale 1 puff into the lungs daily. Patient taking differently: Inhale 1 puff into the lungs daily as needed.  11/07/19   [name], PA-C  gentamicin cream (GARAMYCIN) 0.1 % Apply 1 application topically 2 (two) times daily. Patient taking differently: Apply 1 application topically 2 (two) times daily as needed.  11/20/19   Edrick Kins, DPM  hydrALAZINE (APRESOLINE) 100 MG tablet TAKE (1) TABLET BY MOUTH THREE TIMES DAILY Patient taking differently: Take 100 mg by mouth 3 (three) times daily.  08/14/19   [name], PA-C  hydrOXYzine (ATARAX/VISTARIL) 25 MG tablet Take 25 mg by mouth 4 (four) times daily. 01/06/20   [provider]  LEVEMIR FLEXTOUCH 100 UNIT/ML FlexPen INJECT 60 UNITS UNDER THE SKIN TWICE DAILY 11/07/19   [name], PA-C  lidocaine-prilocaine (EMLA) cream Apply 1 application  topically as needed. 03/27/19   Schnier, Dolores Lory, MD  liraglutide (VICTOZA) 18 MG/3ML SOPN Start with 0.22m daily and increase by 0.623mper week until max dose of 1.8 mg dose achieved. Patient taking differently: Inject 0.6-1.8 mg into the skin See admin instructions. Start with 0.64m34maily and increase by 0.8mg23mr week until max dose of 1.2mg 75me achieved. 09/08/19   BurneMar DaringC  LOPERAMIDE HCL PO Take 1 tablet by mouth as needed.  10/21/19 10/19/20  [provider]  loratadine (CLARITIN) 10 MG tablet TAKE 1 TABLET BY MOUTH ONCE DAILY. Patient taking differently: Take 10 mg by mouth at bedtime.  08/11/19   Burne[place] DaringC  mometasone (ELOCON) 0.1 % cream APPLY AS DIRECTED TO AFFECTED AREA ONCE DAILY. Patient taking differently: Apply 1 application topically daily as needed.  12/08/18   BurneMar DaringC  montelukast (SINGULAIR) 10 MG tablet TAKE ONE TABLET BY MOUTH AT BEDTIME. Patient taking differently: Take 10 mg by mouth at bedtime.  08/11/19   BurneMar DaringC  nitroGLYCERIN (NITROSTAT) 0.4 MG SL tablet Place 1 tablet (0.4 mg total) under the tongue every 5 (five) minutes x 3 doses as needed for chest pain. 02/03/19   Bhagat, Bhavinkumar, PA  NOVOLOG FLEXPEN 100 UNIT/ML FlexPen USE 25 UNITS UNDER THE SKIN THREE TIMES DAILY WITH MEALS Patient taking differently: Inject 25 Units into the skin 3 (three) times daily with meals.  07/18/19   BurneMar DaringC  nystatin (MYCOSTATIN) 100000 UNIT/ML suspension Take 5 mLs (500,000 Units total) by mouth 4 (four) times daily. 01/25/20   BurneMar DaringC  nystatin cream (MYCOSTATIN) Apply 1 application topically 2 (two) times daily. Patient taking differently: Apply 1 application topically daily as needed for dry skin.  08/10/19   BurneMar DaringC  omeprazole (PRILOSEC) 20 MG capsule TAKE (1) CAPSULE BY MOUTH ONCE DAILY. Patient taking differently: Take 20 mg by mouth every evening.   08/11/19   BurneMar DaringC  pregabalin (LYRICA) 150 MG capsule Take 1 capsule (150 mg total) by mouth 2 (two) times daily. 08/10/19   Burnette, JenniClearnce SorrelC  SENNA PLUS 8.6-50 MG tablet TAKE ONE TABLET BY MOUTH AT  BEDTIME. Patient taking differently: Take 1 tablet by mouth at bedtime as needed.  08/11/19   [name], PA-C  silver sulfADIAZINE (SILVADENE) 1 % cream Apply 1 application topically daily. Apply to right first toe Patient taking differently: Apply 1 application topically daily as needed (pain).  02/21/19   Kris Hartmann, NP  torsemide (DEMADEX) 20 MG tablet TAKE (2) TABLETS BY MOUTH TWICE DAILY. Patient taking differently: Take 40 mg by mouth 2 (two) times daily.  04/20/19   [name], PA-C  traMADol (ULTRAM) 50 MG tablet Take 1 tablet (50 mg total) by mouth every 8 (eight) hours as needed. 01/25/20   [name], PA-C  triamcinolone cream (KENALOG) 0.1 % APPLY TO AFFECTED AREA TWICE DAILY Patient taking differently: 2 (two) times daily as needed.  06/17/18   [name], PA-C  valACYclovir (VALTREX) 500 MG tablet TAKE (1) TABLET BY MOUTH EVERY OTHER DAY. 10/06/19   [name], PA-C  Vitamin D, Ergocalciferol, (DRISDOL) 1.25 MG (50000 UT) CAPS capsule TAKE 1 CAPSULE BY MOUTH ONCE A MONTH Patient taking differently: Take 50,000 Units by mouth every 30 (thirty) days.  06/16/19   [name], PA-C  gabapentin (NEURONTIN) 100 MG capsule Take 1 capsule (100 mg total) by mouth 3 (three) times daily. 08/19/17 02/03/19  [name], MD    Allergies  Allergen Reactions  . Shellfish Allergy Anaphylaxis and Swelling  . Diazepam Other (See Comments)    "felt like out of body experience"    Family History  Problem Relation Age of Onset  . Colon cancer Mother   . Heart attack Other   . Heart attack Maternal Grandmother   . Hypertension Sister   . Hypertension Brother   . Diabetes Paternal Grandmother   . Breast cancer Neg Hx      Social History Social History   Tobacco Use  . Smoking status: Former Smoker    Packs/day: 0.25    Years: 6.00    Pack years: 1.50    Types: Cigarettes    Quit date: 10/25/1980    Years since quitting: 39.2  . Smokeless tobacco: Never Used  Vaping Use  . Vaping Use: Never used  Substance Use Topics  . Alcohol use: Not Currently  . Drug use: Yes    Types: Marijuana    Comment: Occasional marijuana use 08/21/2016 "no crack-clean since 05/1998"    Review of Systems Constitutional: Negative for fever.  Positive for generalized weakness. Cardiovascular: Negative for chest pain. Respiratory: Negative for shortness of breath. Gastrointestinal: Negative for abdominal pain Musculoskeletal: Lower extremity swelling and discomfort. Neurological: Negative for headache All other ROS negative  ____________________________________________   PHYSICAL EXAM:  VITAL SIGNS: ED Triage Vitals  Enc Vitals Group     BP 01/30/20 1307 (!) 144/48     Pulse Rate 01/30/20 1307 85     Resp 01/30/20 1307 18     Temp 01/30/20 1307 98.3 F (36.8 C)     Temp Source 01/30/20 1307 Oral     SpO2 01/30/20 1307 96 %     Weight 01/30/20 1307 290 lb (131.5 kg)     Height 01/30/20 1307 _0  (1.727 m)     Head Circumference --      Peak Flow --      Pain Score 01/30/20 1312 9     Pain Loc --      Pain Edu? --      Excl. in [place]? --  Constitutional: Alert and oriented. Well appearing and in no distress. Eyes: Normal exam ENT      Mouth/Throat: Mucous membranes are moist. Cardiovascular: Normal rate, regular rhythm. No murmurs, rubs, or gallops. Respiratory: Normal respiratory effort without tachypnea nor retractions. Breath sounds are clear Gastrointestinal: Soft and nontender. No distention.  Musculoskeletal: 2+ lower extreme edema, largely equal bilaterally patient is tender in the left lower extremity and nontender in the right lower extremity. Neurologic:  Normal speech and language. No  gross focal neurologic deficits Skin:  Skin is warm, dry and intact.  No erythema or signs of cellulitis. Psychiatric: Mood and affect are normal.   ____________________________________________    EKG  EKG viewed and interpreted by myself shows normal sinus rhythm at 82 bpm with a narrow QRS, normal axis, normal intervals, no concerning ST changes.  ____________________________________________    RADIOLOGY  Ultrasound negative for DVT.  ____________________________________________   INITIAL IMPRESSION / ASSESSMENT AND PLAN / ED COURSE  Pertinent labs & imaging results that were available during my care of the patient were reviewed by me and considered in my medical decision making (see chart for details).   Patient presents emergency department with complaints of generalized fatigue, lower extremity edema and tenderness left greater than right.  States the pain has been keeping her awake.  Patient's labs are largely at her baseline potassium 5.1.  Patient is dialysis dependent and missed her dialysis on Saturday as well as today.  We will discuss with nephrology to help fully arrange for dialysis tomorrow.  Remainder the patient's work-up is largely nonrevealing thus far, ultrasounds are pending to rule out DVT.  If ultrasounds are negative I would anticipate likely discharge home a short course of pain medication and nephrology follow-up for dialysis tomorrow.  Spoke to Dr. Juleen China of nephrology.  Believes the patient would benefit from dialysis.  We will admit to the hospital service for continued medical treatment control of her hyperglycemia and hopefully closure of anion gap after insulin.  Patient will be admitted to the hospitalist.  [name] was evaluated in Emergency Department on 01/30/2020 for the symptoms described in the history of present illness. She was evaluated in the context of the global COVID-19 pandemic, which necessitated consideration that the patient might be  at risk for infection with the SARS-CoV-2 virus that causes COVID-19. Institutional protocols and algorithms that pertain to the evaluation of patients at risk for COVID-19 are in a state of rapid change based on information released by regulatory bodies including the CDC and federal and state organizations. These policies and algorithms were followed during the patient's care in the ED.  ____________________________________________   FINAL CLINICAL IMPRESSION(S) / ED DIAGNOSES  Lower extremity pain Hyperglycemia Weakness   Harvest Dark, MD 01/30/20 2138

## 2020-01-30 NOTE — ED Triage Notes (Signed)
Pt c/o LE pain, swelling and weakness over the past 3-4 days. Pt states she normally gets dialysis on tues, thurs, Saturday, last treatment was Friday.

## 2020-01-30 NOTE — H&P (Signed)
[name] JEH:[id number] DOB: May 27, 1956 DOA: 01/30/2020     PCP: [name], PA-C   Outpatient Specialists:   NEphrology: Sees at HD  Cardiology Dr. Burt Knack Patient arrived to ER on 01/30/20 at 1259 Referred by Attending Harvest Dark, MD   Patient coming from: home Lives  With family    Chief Complaint:   Chief Complaint  Patient presents with  . Leg Pain  . Weakness    HPI: [name] is a 64 y.o. female with medical history significant of ESRD on hemodialysis Tuesday Thursday Saturday.,  Anemia, aortic stenosis, asthma, CAD, HLD, GERD, gout morbid obesity BMI greater than 40    Presented with   Missed HD for the past 2 sessions reports her last hemodialysis was on Friday 5 days ago.  She has had generalized fatigue and leg swelling for the past 3 to 4 days. Noted that her left leg was actually getting supple bigger than her right.  She has significant pain in her legs and having trouble sleeping and walking around.  She has not any chest pain  Reports she has not had her Levemir for the past 4 days due to pharmacy error  Reports she has not urinated at all for the past 2 days Usually she does 2-3 time a day Reports occasional Shortness of breath Recently was admitted for cellulitis discharged home on a 6 July  Infectious risk factors:  Reports  shortness of breath,   Has  NOt been vaccinated against COVID (refuses)   Initial COVID TEST  NEGATIVE   Lab Results  Component Value Date   [place] 01/30/2020   [place] NEGATIVE 01/16/2020   [place] NEGATIVE 10/31/2019   [place] NEGATIVE 09/15/2019     Regarding pertinent Chronic problems:     Hyperlipidemia -  on statins Lipitor Lipid Panel     Component Value Date/Time   CHOL 122 09/13/2016 1245   TRIG 168 (H) 09/13/2016 1245   HDL 36 (L) 09/13/2016 1245   CHOLHDL 3.4 09/13/2016 1245   VLDL 34 09/13/2016 1245   LDLCALC 52 09/13/2016 1245     HTN on Coreg  hydralazine   chronic CHF diastolic/systolic/ combined - last echo  2/18: EF 55-60, normal wall motion, grade 2 diastolic dysfunction, On torsemide    CAD  - On Aspirin, statin, betablocker, Plavix                 -  followed by cardiology                - last cardiac cath stent in 2018    DM 2 -  Lab Results  Component Value Date   HGBA1C 8.7 (H) 01/16/2020   on insulin, Levemir twice daily 60 units     Morbid obesity-   BMI Readings from Last 1 Encounters:  01/30/20 44.09 kg/m       Asthma -well   controlled on home inhalers/ nebs                             OSA not on CPAP   End-stage renal disease on hemodialysis Estimated Creatinine Clearance: 6.9 mL/min (A) (by C-G formula based on SCr of 12.29 mg/dL (H)).  Lab Results  Component Value Date   CREATININE 12.29 (H) 01/30/2020   CREATININE 9.82 (H) 01/16/2020   CREATININE 8.97 (H) 01/15/2020     Chronic anemia - baseline hg Hemoglobin &  Hematocrit  Recent Labs    01/15/20 1726 01/16/20 0438 01/30/20 1335  HGB 10.2* 9.6* 9.3*    While in ER: DVT US neg BG 450 started on insulin Now down to 170's K 5.1  ER Provider Called:  Nephrology    Dr.Kolluru They Recommend admit to medicine   Will see in AM on HD  Hospitalist was called for admission for fluid overload  The following Work up has been ordered so far:  Orders Placed This Encounter  Procedures  . SARS Coronavirus 2 by RT PCR (hospital order, performed in [facility] hospital lab) Nasopharyngeal Nasopharyngeal Swab  . US Venous Img Lower Bilateral  . Basic metabolic panel  . CBC  . Urinalysis, Complete w Microscopic  . Glucose, capillary  . Cardiac monitoring  . Document Height and Actual Weight  . Saline Lock IV, Maintain IV access  . Consult to hospitalist  ALL PATIENTS BEING ADMITTED/HAVING PROCEDURES NEED COVID-19 SCREENING  . Pulse oximetry, continuous  . CBG monitoring, ED  . ED EKG    Following Medications were ordered in  ER: Medications  sodium chloride flush (NS) 0.9 % injection 3 mL (3 mLs Intravenous Not Given 01/30/20 1729)  HYDROmorphone (DILAUDID) injection 1 mg (1 mg Intramuscular Given 01/30/20 1709)  insulin aspart (novoLOG) injection 8 Units (8 Units Intravenous Given 01/30/20 1800)        Consult Orders  (From admission, onward)         Start     Ordered   01/30/20 1946  Consult to hospitalist  ALL PATIENTS BEING ADMITTED/HAVING PROCEDURES NEED COVID-19 SCREENING  Once       Comments: ALL PATIENTS BEING ADMITTED/HAVING PROCEDURES NEED COVID-19 SCREENING  Provider:  (Not yet assigned)  Question Answer Comment  Place call to: triad   Reason for Consult Admit   Diagnosis/Clinical Info for Consult: weakness      01/30/20 1945           Significant initial  Findings: Abnormal Labs Reviewed  BASIC METABOLIC PANEL - Abnormal; Notable for the following components:      Result Value   Sodium 131 (*)    Chloride 94 (*)    CO2 19 (*)    Glucose, Bld 449 (*)    BUN 111 (*)    Creatinine, Ser 12.29 (*)    Calcium 8.4 (*)    GFR calc non Af Amer 3 (*)    GFR calc Af Amer 3 (*)    Anion gap 18 (*)    All other components within normal limits  CBC - Abnormal; Notable for the following components:   RBC 2.94 (*)    Hemoglobin 9.3 (*)    HCT 27.9 (*)    RDW 15.7 (*)    Platelets 145 (*)    All other components within normal limits  GLUCOSE, CAPILLARY - Abnormal; Notable for the following components:   Glucose-Capillary 174 (*)    All other components within normal limits     Otherwise labs showing:    Recent Labs  Lab 01/30/20 1335  NA 131*  K 5.1  CO2 19*  GLUCOSE 449*  BUN 111*  CREATININE 12.29*  CALCIUM 8.4*    Cr    Lab Results  Component Value Date   CREATININE 12.29 (H) 01/30/2020   CREATININE 9.82 (H) 01/16/2020   CREATININE 8.97 (H) 01/15/2020    No results for input(s): AST, ALT, ALKPHOS, BILITOT, PROT, ALBUMIN in the last 168 hours. Lab Results  Component Value Date   CALCIUM 8.4 (L) 01/30/2020   PHOS 7.0 (H) 11/06/2019      WBC      Component Value Date/Time   WBC 7.2 01/30/2020 1335   ANC    Component Value Date/Time   NEUTROABS 4.7 01/15/2020 1726   NEUTROABS 7.9 (H) 01/10/2018 1124      Plt: Lab Results  Component Value Date   PLT 145 (L) 01/30/2020       COVID-19 Labs  No results for input(s): DDIMER, FERRITIN, LDH, CRP in the last 72 hours.  Lab Results  Component Value Date   [place] NEGATIVE 01/30/2020   [place] NEGATIVE 01/16/2020   [place] NEGATIVE 10/31/2019   [place] NEGATIVE 09/15/2019     Venous  Blood Gas result:  pH  7.30 pCO2 45;    HG/HCT   Stable,     Component Value Date/Time   HGB 9.3 (L) 01/30/2020 1335   HGB 8.9 (L) 01/10/2018 1124   HCT 27.9 (L) 01/30/2020 1335   HCT 28.2 (L) 01/10/2018 1124    No results for input(s): LIPASE, AMYLASE in the last 168 hours. No results for input(s): AMMONIA in the last 168 hours.     Troponin 9     ECG: Ordered Personally reviewed by me showing: HR : 82 Rhythm:  NSR,   no evidence of ischemic changes QTC 479   BNP (last 3 results) Recent Labs    10/21/19 1257 10/31/19 1200  BNP 99.3 236.5*      DM  labs:  HbA1C: Recent Labs    01/16/20 0438  HGBA1C 8.7*       CBG (last 3)  Recent Labs    01/30/20 1939 01/30/20 2334  GLUCAP 174* 78        Ordered    CXR - NON acute   Bilateral lower extremity Dopplers unremarkable no evidence of DVT    ED Triage Vitals  Enc Vitals Group     BP 01/30/20 1307 (!) 144/48     Pulse Rate 01/30/20 1307 85     Resp 01/30/20 1307 18     Temp 01/30/20 1307 98.3 F (36.8 C)     Temp Source 01/30/20 1307 Oral     SpO2 01/30/20 1307 96 %     Weight 01/30/20 1307 290 lb (131.5 kg)     Height 01/30/20 1307 _0  (1.727 m)     Head Circumference --      Peak Flow --      Pain Score 01/30/20 1312 9     Pain Loc --      Pain Edu? --      Excl. in [place]? --    TMAX(24)@       Latest  Blood pressure (!) 131/49, pulse 83, temperature 98.3 F (36.8 C), temperature source Oral, resp. rate 20, height _1  (1.727 m), weight 131.5 kg, SpO2 95 %.     Review of Systems:    Pertinent positives include:   fatigue  Constitutional:  No weight loss, night sweats, Fevers, chills,, weight loss  HEENT:  No headaches, Difficulty swallowing,Tooth/dental problems,Sore throat,  No sneezing, itching, ear ache, nasal congestion, post nasal drip,  Cardio-vascular:  No chest pain, Orthopnea, PND, anasarca, dizziness, palpitations.no Bilateral lower extremity swelling  GI:  No heartburn, indigestion, abdominal pain, nausea, vomiting, diarrhea, change in bowel habits, loss of appetite, melena, blood in stool, hematemesis Resp:  no shortness of breath at rest. No dyspnea on exertion, No  excess mucus, no productive cough, No non-productive cough, No coughing up of blood.No change in color of mucus.No wheezing. Skin:  no rash or lesions. No jaundice GU:  no dysuria, change in color of urine, no urgency or frequency. No straining to urinate.  No flank pain.  Musculoskeletal:  No joint pain or no joint swelling. No decreased range of motion. No back pain.  Psych:  No change in mood or affect. No depression or anxiety. No memory loss.  Neuro: no localizing neurological complaints, no tingling, no weakness, no double vision, no gait abnormality, no slurred speech, no confusion  All systems reviewed and apart from [name] all are negative  Past Medical History:   Past Medical History:  Diagnosis Date  . Anemia   . Aortic stenosis    Echo 8/18: mean 13, peak 28, LVOT/AV mean velocity 0.51  . Arthritis   . Asthma    As a child   . Bronchitis   . CAD (coronary artery disease)    a. 09/2016: 50% Ost 1st Mrg stenosis, 50% 2nd Mrg stenosis, 20% Mid-Cx, 95% Prox LAD, 40% mid-LAD, and 10% dist-LAD stenosis. Staged PCI with DES to Prox-LAD.   [name] Chronic combined  systolic and diastolic CHF (congestive heart failure) ([place]) 2011   echo 2/18: EF 55-60, normal wall motion, grade 2 diastolic dysfunction, trivial AI // echo 3/18: Septal and apical HK, EF 45-50, normal wall motion, trivial AI, mild LAE, PASP 38 // echo 8/18: EF 60-65, normal wall motion, grade 1 diastolic dysfunction, calcified aortic valve leaflets, mild aortic stenosis (mean 13, peak 28, LVOT/AV mean velocity 0.51), mild AI, moderate MAC, mild LAE, trivial TR   . Chronic kidney disease    STAGE 4  . Chronic kidney disease on chronic dialysis (HCC)    t, th, sat  . Complication of anesthesia   . Depression   . Diabetes mellitus Dx 1989  . Elevated lipids   . GERD (gastroesophageal reflux disease)   . Gout   . Heart murmur    asymptomatic  . Hepatitis C Dx 2013  . Hypertension Dx 1989  . Infected surgical wound    Lt arm  . Myocardial infarction ([place]) 07/2015  . Obesity   . Pancreatitis 2013  . Pneumonia   . Refusal of blood transfusions as patient is Jehovah's Witness   . Tendinitis   . Tremors of nervous system    LEFT HAND  . Ulcer 2010      Past Surgical History:  Procedure Laterality Date  . A/V FISTULAGRAM Left 04/11/2019   Procedure: A/V FISTULAGRAM;  Surgeon: Katha Cabal, MD;  Location: [place] [facility];  Service: Cardiovascular;  Laterality: Left;  . A/V FISTULAGRAM Left 06/02/2019   Procedure: A/V FISTULAGRAM;  Surgeon: Katha Cabal, MD;  Location: [place] [facility];  Service: Cardiovascular;  Laterality: Left;  . APPLICATION OF WOUND VAC Left 08/14/2017   Procedure: APPLICATION OF WOUND VAC Exchange;  Surgeon: Robert Bellow, MD;  Location: [facility];  Service: General;  Laterality: Left;  . APPLICATION OF WOUND VAC Left 12/21/2018   Procedure: APPLICATION OF WOUND VAC;  Surgeon: Katha Cabal, MD;  Location: [facility];  Service: Vascular;  Laterality: Left;  . AV FISTULA PLACEMENT Left 08/19/2018   Procedure: ARTERIOVENOUS (AV) FISTULA  CREATION ( BRACHIOBASILIC );  Surgeon: Katha Cabal, MD;  Location: [facility];  Service: Vascular;  Laterality: Left;  . BASCILIC VEIN TRANSPOSITION Left 11/18/2018   Procedure: BASCILIC VEIN  TRANSPOSITION;  Surgeon: Katha Cabal, MD;  Location: [facility];  Service: Vascular;  Laterality: Left;  . CHOLECYSTECTOMY    . COLONOSCOPY WITH PROPOFOL N/A 02/03/2018   Procedure: COLONOSCOPY WITH PROPOFOL;  Surgeon: Lin Landsman, MD;  Location: [facility];  Service: Gastroenterology;  Laterality: N/A;  . CORONARY ANGIOPLASTY  07/2015   STENT  . CORONARY STENT INTERVENTION N/A 09/18/2016   Procedure: Coronary Stent Intervention;  Surgeon: Troy Sine, MD;  Location: [place] [facility];  Service: Cardiovascular;  Laterality: N/A;  . DIALYSIS/PERMA CATHETER INSERTION N/A 05/10/2018   Procedure: DIALYSIS/PERMA CATHETER INSERTION;  Surgeon: Katha Cabal, MD;  Location: [place] [facility];  Service: Cardiovascular;  Laterality: N/A;  . DRESSING CHANGE UNDER ANESTHESIA Left 08/15/2017   Procedure: exploration of wound for bleeding;  Surgeon: Robert Bellow, MD;  Location: [facility];  Service: General;  Laterality: Left;  . ESOPHAGOGASTRODUODENOSCOPY (EGD) WITH PROPOFOL N/A 02/03/2018   Procedure: ESOPHAGOGASTRODUODENOSCOPY (EGD) WITH PROPOFOL;  Surgeon: Lin Landsman, MD;  Location: [facility] ENDOSCOPY;  Service: Gastroenterology;  Laterality: N/A;  . EYE SURGERY  11/17/2018  . INCISION AND DRAINAGE ABSCESS Left 08/12/2017   Procedure: INCISION AND DRAINAGE ABSCESS;  Surgeon: Robert Bellow, MD;  Location: [facility];  Service: General;  Laterality: Left;  . KNEE ARTHROSCOPY    . LEFT HEART CATH N/A 09/18/2016   Procedure: Left Heart Cath;  Surgeon: Troy Sine, MD;  Location: [place] [facility];  Service: Cardiovascular;  Laterality: N/A;  . LEFT HEART CATH AND CORONARY ANGIOGRAPHY N/A 09/16/2016   Procedure: Left Heart Cath and Coronary Angiography;  Surgeon: Burnell Blanks, MD;  Location: [place] [facility];  Service: Cardiovascular;  Laterality: N/A;  . LEFT HEART CATH AND CORONARY ANGIOGRAPHY N/A 04/29/2017   Procedure: LEFT HEART CATH AND CORONARY ANGIOGRAPHY;  Surgeon: Nelva Bush, MD;  Location: [place] [facility];  Service: Cardiovascular;  Laterality: N/A;  . LOWER EXTREMITY ANGIOGRAPHY Right 03/08/2018   Procedure: LOWER EXTREMITY ANGIOGRAPHY;  Surgeon: Katha Cabal, MD;  Location: [place] [facility];  Service: Cardiovascular;  Laterality: Right;  . TUBAL LIGATION    . TUBAL LIGATION    . UPPER EXTREMITY ANGIOGRAPHY Right 09/19/2019   Procedure: UPPER EXTREMITY ANGIOGRAPHY;  Surgeon: Katha Cabal, MD;  Location: [place] [facility];  Service: Cardiovascular;  Laterality: Right;  . WOUND DEBRIDEMENT Left 12/21/2018   Procedure: DEBRIDEMENT WOUND;  Surgeon: Katha Cabal, MD;  Location: [facility];  Service: Vascular;  Laterality: Left;  . WOUND DEBRIDEMENT Left 12/30/2018   Procedure: DEBRIDEMENT WOUND WITH VAC PLACEMENT (LEFT UPPER EXTREMITY);  Surgeon: Katha Cabal, MD;  Location: [facility];  Service: Vascular;  Laterality: Left;    Social History:  Ambulatory    Gilford Rile    reports that she quit smoking about 39 years ago. Her smoking use included cigarettes. She has a 1.50 pack-year smoking history. She has never used smokeless tobacco. She reports previous alcohol use. She reports current drug use. Drug: Marijuana.     Family History:   Family History  Problem Relation Age of Onset  . Colon cancer Mother   . Heart attack Other   . Heart attack Maternal Grandmother   . Hypertension Sister   . Hypertension Brother   . Diabetes Paternal Grandmother   . Breast cancer Neg Hx     Allergies: Allergies  Allergen Reactions  . Shellfish Allergy Anaphylaxis and Swelling  . Diazepam Other (See Comments)    "  felt like out of body experience"     Prior to Admission medications   Medication Sig Start Date End Date  Taking? Authorizing Provider  albuterol (VENTOLIN HFA) 108 (90 Base) MCG/ACT inhaler Inhale 2 puffs into the lungs every 4 (four) hours as needed for wheezing or shortness of breath. 10/21/19 12/26/19  Janeece Fitting, PA-C  allopurinol (ZYLOPRIM) 100 MG tablet TAKE 1 TABLET BY MOUTH ONCE DAILY. 12/08/19   [name], PA-C  aspirin EC 81 MG EC tablet Take 1 tablet (81 mg total) by mouth daily. 09/19/16   Strader, Fransisco Hertz, PA-C  atorvastatin (LIPITOR) 80 MG tablet TAKE 1 TABLET BY MOUTH ONCE DAILY AT 6PM 11/09/19   [name], PA-C  AURYXIA 1 GM 210 MG(Fe) tablet Take 420 mg by mouth 3 (three) times daily with meals.  10/10/18   [provider]  b complex-vitamin c-folic acid (NEPHRO-VITE) 0.8 MG TABS tablet Take 1 tablet by mouth daily.     [provider]  carvedilol (COREG) 25 MG tablet TAKE (1) TABLET BY MOUTH TWICE A DAY WITH MEALS (BREAKFAST AND SUPPER) 11/09/19   Burnette, Clearnce Sorrel, PA-C  cephALEXin (KEFLEX) 500 MG capsule Take 1 capsule (500 mg total) by mouth 2 (two) times daily. 01/25/20   [name], PA-C  Cinacalcet HCl (SENSIPAR PO) Take 30 mg by mouth every dialysis. 01/04/20 01/02/21  [provider]  cyclobenzaprine (FLEXERIL) 5 MG tablet Take 1 tablet (5 mg total) by mouth 3 (three) times daily as needed for muscle spasms. 08/10/19   [name], PA-C  dicyclomine (BENTYL) 20 MG tablet TAKE 1 TABLET(20 MG) BY MOUTH FOUR TIMES DAILY BEFORE MEALS AND AT BEDTIME 10/16/19   [name], PA-C  docusate sodium (COLACE) 100 MG capsule Take 1 capsule (100 mg total) by mouth 2 (two) times daily as needed for mild constipation. 09/20/17   Gouru, Illene Silver, MD  doxepin (SINEQUAN) 10 MG capsule Take 1 capsule (10 mg total) by mouth 4 (four) times daily as needed. For itching 11/07/19   [name], PA-C  DULERA 200-5 MCG/ACT AERO Inhale 2 puffs into the lungs 2 (two) times daily as needed.  11/26/19   [provider]   fluticasone (FLONASE) 50 MCG/ACT nasal spray Place 2 sprays into both nostrils daily as needed for allergies or rhinitis. 09/28/17   [name], PA-C  fluticasone furoate-vilanterol (BREO ELLIPTA) 200-25 MCG/INH AEPB Inhale 1 puff into the lungs daily. Patient taking differently: Inhale 1 puff into the lungs daily as needed.  11/07/19   [name], PA-C  gentamicin cream (GARAMYCIN) 0.1 % Apply 1 application topically 2 (two) times daily. Patient taking differently: Apply 1 application topically 2 (two) times daily as needed.  11/20/19   Edrick Kins, DPM  hydrALAZINE (APRESOLINE) 100 MG tablet TAKE (1) TABLET BY MOUTH THREE TIMES DAILY Patient taking differently: Take 100 mg by mouth 3 (three) times daily.  08/14/19   [name], PA-C  hydrOXYzine (ATARAX/VISTARIL) 25 MG tablet Take 25 mg by mouth 4 (four) times daily. 01/06/20   [provider]  LEVEMIR FLEXTOUCH 100 UNIT/ML FlexPen INJECT 60 UNITS UNDER THE SKIN TWICE DAILY 11/07/19   [name], PA-C  lidocaine-prilocaine (EMLA) cream Apply 1 application topically as needed. 03/27/19   Schnier, Dolores Lory, MD  liraglutide (VICTOZA) 18 MG/3ML SOPN Start with 0.20m daily and increase by 0.6106mper week until max dose of 1.8 mg dose achieved. Patient taking differently: Inject  0.6-1.8 mg into the skin See admin instructions. Start with 0.35m daily and increase by 0.854mper week until max dose of 1.24m55mose achieved. 09/08/19   BurMar DaringA-C  LOPERAMIDE HCL PO Take 1 tablet by mouth as needed.  10/21/19 10/19/20  [provider]  loratadine (CLARITIN) 10 MG tablet TAKE 1 TABLET BY MOUTH ONCE DAILY. Patient taking differently: Take 10 mg by mouth at bedtime.  08/11/19   BurMar DaringA-C  mometasone (ELOCON) 0.1 % cream APPLY AS DIRECTED TO AFFECTED AREA ONCE DAILY. Patient taking differently: Apply 1 application topically daily as needed.  12/08/18   BurMar DaringA-C   montelukast (SINGULAIR) 10 MG tablet TAKE ONE TABLET BY MOUTH AT BEDTIME. Patient taking differently: Take 10 mg by mouth at bedtime.  08/11/19   BurMar DaringA-C  nitroGLYCERIN (NITROSTAT) 0.4 MG SL tablet Place 1 tablet (0.4 mg total) under the tongue every 5 (five) minutes x 3 doses as needed for chest pain. 02/03/19   Bhagat, Bhavinkumar, PA  NOVOLOG FLEXPEN 100 UNIT/ML FlexPen USE 25 UNITS UNDER THE SKIN THREE TIMES DAILY WITH MEALS Patient taking differently: Inject 25 Units into the skin 3 (three) times daily with meals.  07/18/19   BurMar DaringA-C  nystatin (MYCOSTATIN) 100000 UNIT/ML suspension Take 5 mLs (500,000 Units total) by mouth 4 (four) times daily. 01/25/20   BurMar DaringA-C  nystatin cream (MYCOSTATIN) Apply 1 application topically 2 (two) times daily. Patient taking differently: Apply 1 application topically daily as needed for dry skin.  08/10/19   BurMar DaringA-C  omeprazole (PRILOSEC) 20 MG capsule TAKE (1) CAPSULE BY MOUTH ONCE DAILY. Patient taking differently: Take 20 mg by mouth every evening.  08/11/19   BurMar DaringA-C  pregabalin (LYRICA) 150 MG capsule Take 1 capsule (150 mg total) by mouth 2 (two) times daily. 08/10/19   Burnette, Jennifer M, PA-C  SENNA PLUS 8.6-50 MG tablet TAKE ONE TABLET BY MOUTH AT BEDTIME. Patient taking differently: Take 1 tablet by mouth at bedtime as needed.  08/11/19   BurMar DaringA-C  silver sulfADIAZINE (SILVADENE) 1 % cream Apply 1 application topically daily. Apply to right first toe Patient taking differently: Apply 1 application topically daily as needed (pain).  02/21/19   BroKris HartmannP  torsemide (DEMADEX) 20 MG tablet TAKE (2) TABLETS BY MOUTH TWICE DAILY. Patient taking differently: Take 40 mg by mouth 2 (two) times daily.  04/20/19   BurMar DaringA-C  traMADol (ULTRAM) 50 MG tablet Take 1 tablet (50 mg total) by mouth every 8 (eight) hours as needed. 01/25/20    BurMar DaringA-C  triamcinolone cream (KENALOG) 0.1 % APPLY TO AFFECTED AREA TWICE DAILY Patient taking differently: 2 (two) times daily as needed.  06/17/18   BurMar DaringA-C  valACYclovir (VALTREX) 500 MG tablet TAKE (1) TABLET BY MOUTH EVERY OTHER DAY. 10/06/19   BurMar DaringA-C  Vitamin D, Ergocalciferol, (DRISDOL) 1.25 MG (50000 UT) CAPS capsule TAKE 1 CAPSULE BY MOUTH ONCE A MONTH Patient taking differently: Take 50,000 Units by mouth every 30 (thirty) days.  06/16/19   BurMar DaringA-C  gabapentin (NEURONTIN) 100 MG capsule Take 1 capsule (100 mg total) by mouth 3 (three) times daily. 08/19/17 02/03/19  PatFritzi MandesD   Physical Exam: Vitals with BMI 01/30/2020 01/30/2020 01/25/2020  Height - _0  -  Weight - 290 lbs 293 lbs  BMI - 61.4 43.15  Systolic 400 867 619  Diastolic 49 48 74  Pulse 83 85 84     1. General:  in No   Acute distress   chronically ill  -appearing 2. Psychological: Alert and   Oriented 3. Head/ENT:   Moist  Mucous Membranes                          Head Non traumatic, neck supple                           Poor Dentition 4. SKIN:  decreased Skin turgor,  Skin clean Dry and intact no rash 5. Heart: Regular rate and rhythm no  Murmur, no Rub or gallop 6. Lungs: , no wheezes or crackles   7. Abdomen: Soft, non-tender, Non distended   Obese bowel sounds present 8. Lower extremities: no clubbing, cyanosis, bilateral edema 9. Neurologically Grossly intact, moving all 4 extremities equally  10. MSK: Normal range of motion   All other LABS:     Recent Labs  Lab 01/30/20 1335  WBC 7.2  HGB 9.3*  HCT 27.9*  MCV 94.9  PLT 145*     Recent Labs  Lab 01/30/20 1335  NA 131*  K 5.1  CL 94*  CO2 19*  GLUCOSE 449*  BUN 111*  CREATININE 12.29*  CALCIUM 8.4*     No results for input(s): AST, ALT, ALKPHOS, BILITOT, PROT, ALBUMIN in the last 168 hours.     Cultures:    Component Value Date/Time   SDES BLOOD  RIGHT FA 12/28/2018 2315   SPECREQUEST  12/28/2018 2315    BOTTLES DRAWN AEROBIC AND ANAEROBIC Blood Culture adequate volume   CULT  12/28/2018 2315    NO GROWTH 5 DAYS Performed at [facility], [address] Madelaine Bhat [place], [place] [address]    REPTSTATUS 01/02/2019 FINAL 12/28/2018 2315     Radiological Exams on Admission: US Venous Img Lower Bilateral  Result Date: 01/30/2020 CLINICAL DATA:  Initial evaluation for acute swelling, pain. EXAM: BILATERAL LOWER EXTREMITY VENOUS DOPPLER ULTRASOUND TECHNIQUE: Gray-scale sonography with graded compression, as well as color Doppler and duplex ultrasound were performed to evaluate the lower extremity deep venous systems from the level of the common femoral vein and including the common femoral, femoral, profunda femoral, popliteal and calf veins including the posterior tibial, peroneal and gastrocnemius veins when visible. The superficial great saphenous vein was also interrogated. Spectral Doppler was utilized to evaluate flow at rest and with distal augmentation maneuvers in the common femoral, femoral and popliteal veins. COMPARISON:  None. FINDINGS: RIGHT LOWER EXTREMITY Common Femoral Vein: No evidence of thrombus. Normal compressibility, respiratory phasicity and response to augmentation. Saphenofemoral Junction: No evidence of thrombus. Normal compressibility and flow on color Doppler imaging. Profunda Femoral Vein: No evidence of thrombus. Normal compressibility and flow on color Doppler imaging. Femoral Vein: No evidence of thrombus. Normal compressibility, respiratory phasicity and response to augmentation. Popliteal Vein: No evidence of thrombus. Normal compressibility, respiratory phasicity and response to augmentation. Calf Veins: No evidence of thrombus. Normal compressibility and flow on color Doppler imaging. Peroneal vein not visualized. Superficial Great Saphenous Vein: No evidence of thrombus. Normal compressibility. Venous Reflux:   None. Other Findings:  None. LEFT LOWER EXTREMITY Common Femoral Vein: No evidence of thrombus. Normal compressibility, respiratory phasicity and response to augmentation. Saphenofemoral Junction: No evidence of thrombus. Normal compressibility and flow on color  Doppler imaging. Profunda Femoral Vein: No evidence of thrombus. Normal compressibility and flow on color Doppler imaging. Femoral Vein: No evidence of thrombus. Normal compressibility, respiratory phasicity and response to augmentation. Popliteal Vein: No evidence of thrombus. Normal compressibility, respiratory phasicity and response to augmentation. Calf Veins: No evidence of thrombus. Normal compressibility and flow on color Doppler imaging. Peroneal vein not visualized. Superficial Great Saphenous Vein: No evidence of thrombus. Normal compressibility. Venous Reflux:  None. Other Findings:  None. IMPRESSION: No evidence of deep venous thrombosis in either lower extremity. Electronically Signed   By: Jeannine Boga M.D.   On: 01/30/2020 18:28    Chart has been reviewed   Assessment/Plan   64 y.o. female with medical history significant of ESRD on hemodialysis Tuesday Thursday Saturday.,  Anemia, aortic stenosis, asthma, CAD, HLD, GERD, gout morbid obesity BMI greater than 40 Admitted for fluid overload due to noncompliance with HD  Present on Admission: . Fluid overload -most likely secondary to skipping hemodialysis.  Nephrology is aware will hemodialyzed in a.m.  Obtain chest x-Baccari to monitor for any evidence of fluid overload and pulmonary edema   . Anemia due to end stage renal disease ([place]) -chronic stable defer to nephrology for long-term care.  Check anemia panel in a.m.   [name] Asthma -chronic stable continue home medications   . CAD (coronary artery disease) chronic stable continue home medications including a statin and a beta-blocker   . Chronic gout due to renal impairment of multiple sites without tophus -chronic stable  continue medication   . Diabetes mellitus type 2, uncontrolled, with complications (HCC)  - Order Sensitive  SSI   -Levemir 60 units nightly and adjust as needed  -  check TSH and HgA1C Given the hypoglycemia initially order diabetes coordinator consult   . Diabetic polyneuropathy associated with type 2 diabetes mellitus ([place]) continue home medications Lyrica   . Essential hypertension -continue home medications Monitor for blood pressure changes during dialysis   . GERD (gastroesophageal reflux disease) -chronic continue home meds   . Hyperglycemia -currently improved after insulin administration continue to monitor   Now been hypoglycemic will treat and repeat  . Hyperlipidemia -stable continue home meds   . Morbid (severe) obesity due to excess calories ([place]) -need nutritional evaluation as an outpatient continue to monitor.   . OSA (obstructive sleep apnea) - not on CPAP   Other plan as per orders.  DVT prophylaxis:  SCD      Code Status:    Code Status: Prior FULL CODE  as per patient   I had personally discussed CODE STATUS with patient    Family Communication:   Family not at  Bedside    Disposition Plan:     To home once workup is complete and patient is stable   Following barriers for discharge:                            Electrolytes corrected                                                       Will need consultants to evaluate patient prior to discharge                        Would  benefit from PT/OT eval prior to DC  Ordered                                     Diabetes care coordinator                                      Consults called: nephrology is aware  Admission status:  ED Disposition    ED Disposition Condition Comment   Admit  The patient appears reasonably stabilized for admission considering the current resources, flow, and capabilities available in the ED at this time, and I doubt any other [facility] requiring further screening and/or  treatment in the ED prior to admission is  present.       Obs     Level of care   tele  For  24H     Lab Results  Component Value Date   [place] NEGATIVE 01/30/2020     Precautions: admitted as Covid Negative    PPE: Used by the provider:   N95  eye Goggles,  Gloves    Amaiya Scruton 01/31/2020, 2:29 AM    [facility]     after 2 AM please page floor coverage PA If 7AM-7PM, please contact the day team taking care of the patient using [email or web address]   Patient was evaluated in the context of the global COVID-19 pandemic, which necessitated consideration that the patient might be at risk for infection with the SARS-CoV-2 virus that causes COVID-19. Institutional protocols and algorithms that pertain to the evaluation of patients at risk for COVID-19 are in a state of rapid change based on information released by regulatory bodies including the CDC and federal and state organizations. These policies and algorithms were followed during the patient's care.

## 2020-01-31 ENCOUNTER — Inpatient Hospital Stay: Payer: Medicare HMO

## 2020-01-31 DIAGNOSIS — N186 End stage renal disease: Secondary | ICD-10-CM | POA: Diagnosis present

## 2020-01-31 DIAGNOSIS — I350 Nonrheumatic aortic (valve) stenosis: Secondary | ICD-10-CM | POA: Diagnosis present

## 2020-01-31 DIAGNOSIS — E1122 Type 2 diabetes mellitus with diabetic chronic kidney disease: Secondary | ICD-10-CM

## 2020-01-31 DIAGNOSIS — Z20822 Contact with and (suspected) exposure to covid-19: Secondary | ICD-10-CM | POA: Diagnosis present

## 2020-01-31 DIAGNOSIS — M25562 Pain in left knee: Secondary | ICD-10-CM

## 2020-01-31 DIAGNOSIS — E1142 Type 2 diabetes mellitus with diabetic polyneuropathy: Secondary | ICD-10-CM | POA: Diagnosis present

## 2020-01-31 DIAGNOSIS — M25561 Pain in right knee: Secondary | ICD-10-CM | POA: Diagnosis present

## 2020-01-31 DIAGNOSIS — Z992 Dependence on renal dialysis: Secondary | ICD-10-CM | POA: Diagnosis not present

## 2020-01-31 DIAGNOSIS — Z9115 Patient's noncompliance with renal dialysis: Secondary | ICD-10-CM | POA: Diagnosis not present

## 2020-01-31 DIAGNOSIS — R531 Weakness: Secondary | ICD-10-CM | POA: Diagnosis present

## 2020-01-31 DIAGNOSIS — I2510 Atherosclerotic heart disease of native coronary artery without angina pectoris: Secondary | ICD-10-CM | POA: Diagnosis present

## 2020-01-31 DIAGNOSIS — Z6841 Body Mass Index (BMI) 40.0 and over, adult: Secondary | ICD-10-CM | POA: Diagnosis not present

## 2020-01-31 DIAGNOSIS — N2581 Secondary hyperparathyroidism of renal origin: Secondary | ICD-10-CM | POA: Diagnosis present

## 2020-01-31 DIAGNOSIS — G4733 Obstructive sleep apnea (adult) (pediatric): Secondary | ICD-10-CM | POA: Diagnosis present

## 2020-01-31 DIAGNOSIS — I132 Hypertensive heart and chronic kidney disease with heart failure and with stage 5 chronic kidney disease, or end stage renal disease: Secondary | ICD-10-CM | POA: Diagnosis present

## 2020-01-31 DIAGNOSIS — I5031 Acute diastolic (congestive) heart failure: Secondary | ICD-10-CM

## 2020-01-31 DIAGNOSIS — K219 Gastro-esophageal reflux disease without esophagitis: Secondary | ICD-10-CM | POA: Diagnosis present

## 2020-01-31 DIAGNOSIS — D631 Anemia in chronic kidney disease: Secondary | ICD-10-CM | POA: Diagnosis present

## 2020-01-31 DIAGNOSIS — J45909 Unspecified asthma, uncomplicated: Secondary | ICD-10-CM | POA: Diagnosis present

## 2020-01-31 DIAGNOSIS — E785 Hyperlipidemia, unspecified: Secondary | ICD-10-CM | POA: Diagnosis present

## 2020-01-31 DIAGNOSIS — N08 Glomerular disorders in diseases classified elsewhere: Secondary | ICD-10-CM | POA: Diagnosis present

## 2020-01-31 DIAGNOSIS — M1A9XX0 Chronic gout, unspecified, without tophus (tophi): Secondary | ICD-10-CM | POA: Diagnosis present

## 2020-01-31 DIAGNOSIS — E8770 Fluid overload, unspecified: Secondary | ICD-10-CM | POA: Diagnosis not present

## 2020-01-31 DIAGNOSIS — E11649 Type 2 diabetes mellitus with hypoglycemia without coma: Secondary | ICD-10-CM | POA: Diagnosis present

## 2020-01-31 LAB — MRSA PCR SCREENING: MRSA by PCR: NEGATIVE

## 2020-01-31 LAB — MAGNESIUM: Magnesium: 2.2 mg/dL (ref 1.7–2.4)

## 2020-01-31 LAB — COMPREHENSIVE METABOLIC PANEL
ALT: 86 U/L — ABNORMAL HIGH (ref 0–44)
AST: 66 U/L — ABNORMAL HIGH (ref 15–41)
Albumin: 3.3 g/dL — ABNORMAL LOW (ref 3.5–5.0)
Alkaline Phosphatase: 324 U/L — ABNORMAL HIGH (ref 38–126)
Anion gap: 16 — ABNORMAL HIGH (ref 5–15)
BUN: 106 mg/dL — ABNORMAL HIGH (ref 8–23)
CO2: 22 mmol/L (ref 22–32)
Calcium: 8.4 mg/dL — ABNORMAL LOW (ref 8.9–10.3)
Chloride: 97 mmol/L — ABNORMAL LOW (ref 98–111)
Creatinine, Ser: 12.87 mg/dL — ABNORMAL HIGH (ref 0.44–1.00)
GFR calc Af Amer: 3 mL/min — ABNORMAL LOW (ref >60)
GFR calc non Af Amer: 3 mL/min — ABNORMAL LOW (ref >60)
Glucose, Bld: 123 mg/dL — ABNORMAL HIGH (ref 70–99)
Potassium: 5.3 mmol/L — ABNORMAL HIGH (ref 3.5–5.1)
Sodium: 135 mmol/L (ref 135–145)
Total Bilirubin: 0.8 mg/dL (ref 0.3–1.2)
Total Protein: 7.6 g/dL (ref 6.5–8.1)

## 2020-01-31 LAB — RETICULOCYTES
Immature Retic Fract: 14.8 % (ref 2.3–15.9)
RBC.: 3.11 MIL/uL — ABNORMAL LOW (ref 3.87–5.11)
Retic Count, Absolute: 69.4 10*3/uL (ref 19.0–186.0)
Retic Ct Pct: 2.2 % (ref 0.4–3.1)

## 2020-01-31 LAB — CBC WITH DIFFERENTIAL/PLATELET
Abs Immature Granulocytes: 0.16 10*3/uL — ABNORMAL HIGH (ref 0.00–0.07)
Basophils Absolute: 0 10*3/uL (ref 0.0–0.1)
Basophils Relative: 1 %
Eosinophils Absolute: 0.3 10*3/uL (ref 0.0–0.5)
Eosinophils Relative: 4 %
HCT: 29.2 % — ABNORMAL LOW (ref 36.0–46.0)
Hemoglobin: 9.8 g/dL — ABNORMAL LOW (ref 12.0–15.0)
Immature Granulocytes: 2 %
Lymphocytes Relative: 12 %
Lymphs Abs: 0.9 10*3/uL (ref 0.7–4.0)
MCH: 31.1 pg (ref 26.0–34.0)
MCHC: 33.6 g/dL (ref 30.0–36.0)
MCV: 92.7 fL (ref 80.0–100.0)
Monocytes Absolute: 1 10*3/uL (ref 0.1–1.0)
Monocytes Relative: 14 %
Neutro Abs: 5.2 10*3/uL (ref 1.7–7.7)
Neutrophils Relative %: 67 %
Platelets: 158 10*3/uL (ref 150–400)
RBC: 3.15 MIL/uL — ABNORMAL LOW (ref 3.87–5.11)
RDW: 15.9 % — ABNORMAL HIGH (ref 11.5–15.5)
WBC: 7.6 10*3/uL (ref 4.0–10.5)
nRBC: 0 % (ref 0.0–0.2)

## 2020-01-31 LAB — CBC
HCT: 27.7 % — ABNORMAL LOW (ref 36.0–46.0)
HCT: 29.5 % — ABNORMAL LOW (ref 36.0–46.0)
Hemoglobin: 9.3 g/dL — ABNORMAL LOW (ref 12.0–15.0)
Hemoglobin: 9.6 g/dL — ABNORMAL LOW (ref 12.0–15.0)
MCH: 31 pg (ref 26.0–34.0)
MCH: 31.3 pg (ref 26.0–34.0)
MCHC: 32.5 g/dL (ref 30.0–36.0)
MCHC: 33.6 g/dL (ref 30.0–36.0)
MCV: 93.3 fL (ref 80.0–100.0)
MCV: 95.2 fL (ref 80.0–100.0)
Platelets: 145 10*3/uL — ABNORMAL LOW (ref 150–400)
Platelets: 150 10*3/uL (ref 150–400)
RBC: 2.97 MIL/uL — ABNORMAL LOW (ref 3.87–5.11)
RBC: 3.1 MIL/uL — ABNORMAL LOW (ref 3.87–5.11)
RDW: 15.9 % — ABNORMAL HIGH (ref 11.5–15.5)
RDW: 15.9 % — ABNORMAL HIGH (ref 11.5–15.5)
WBC: 7 10*3/uL (ref 4.0–10.5)
WBC: 7.2 10*3/uL (ref 4.0–10.5)
nRBC: 0 % (ref 0.0–0.2)
nRBC: 0 % (ref 0.0–0.2)

## 2020-01-31 LAB — RENAL FUNCTION PANEL
Albumin: 3.2 g/dL — ABNORMAL LOW (ref 3.5–5.0)
Albumin: 3.4 g/dL — ABNORMAL LOW (ref 3.5–5.0)
Anion gap: 14 (ref 5–15)
Anion gap: 16 — ABNORMAL HIGH (ref 5–15)
BUN: 111 mg/dL — ABNORMAL HIGH (ref 8–23)
BUN: 59 mg/dL — ABNORMAL HIGH (ref 8–23)
CO2: 22 mmol/L (ref 22–32)
CO2: 25 mmol/L (ref 22–32)
Calcium: 8 mg/dL — ABNORMAL LOW (ref 8.9–10.3)
Calcium: 8.3 mg/dL — ABNORMAL LOW (ref 8.9–10.3)
Chloride: 93 mmol/L — ABNORMAL LOW (ref 98–111)
Chloride: 98 mmol/L (ref 98–111)
Creatinine, Ser: 12.91 mg/dL — ABNORMAL HIGH (ref 0.44–1.00)
Creatinine, Ser: 7.88 mg/dL — ABNORMAL HIGH (ref 0.44–1.00)
GFR calc Af Amer: 3 mL/min — ABNORMAL LOW (ref >60)
GFR calc Af Amer: 6 mL/min — ABNORMAL LOW (ref >60)
GFR calc non Af Amer: 3 mL/min — ABNORMAL LOW (ref >60)
GFR calc non Af Amer: 5 mL/min — ABNORMAL LOW (ref >60)
Glucose, Bld: 328 mg/dL — ABNORMAL HIGH (ref 70–99)
Glucose, Bld: 84 mg/dL (ref 70–99)
Phosphorus: 5.1 mg/dL — ABNORMAL HIGH (ref 2.5–4.6)
Phosphorus: 9.3 mg/dL — ABNORMAL HIGH (ref 2.5–4.6)
Potassium: 4.2 mmol/L (ref 3.5–5.1)
Potassium: 4.8 mmol/L (ref 3.5–5.1)
Sodium: 132 mmol/L — ABNORMAL LOW (ref 135–145)
Sodium: 136 mmol/L (ref 135–145)

## 2020-01-31 LAB — URIC ACID: Uric Acid, Serum: 6.9 mg/dL (ref 2.5–7.1)

## 2020-01-31 LAB — FERRITIN: Ferritin: 1599 ng/mL — ABNORMAL HIGH (ref 11–307)

## 2020-01-31 LAB — FOLATE: Folate: 51.4 ng/mL (ref >5.9)

## 2020-01-31 LAB — TSH: TSH: 2.096 u[IU]/mL (ref 0.350–4.500)

## 2020-01-31 LAB — TROPONIN I (HIGH SENSITIVITY)
Troponin I (High Sensitivity): 9 ng/L (ref <18)
Troponin I (High Sensitivity): 9 ng/L (ref <18)

## 2020-01-31 LAB — IRON AND TIBC
Iron: 151 ug/dL (ref 28–170)
Saturation Ratios: 49 % — ABNORMAL HIGH (ref 10.4–31.8)
TIBC: 309 ug/dL (ref 250–450)
UIBC: 158 ug/dL

## 2020-01-31 LAB — HEMOGLOBIN A1C
Hgb A1c MFr Bld: 10 % — ABNORMAL HIGH (ref 4.8–5.6)
Mean Plasma Glucose: 240.3 mg/dL

## 2020-01-31 LAB — BLOOD GAS, VENOUS
Acid-base deficit: 4.3 mmol/L — ABNORMAL HIGH (ref 0.0–2.0)
Bicarbonate: 22.1 mmol/L (ref 20.0–28.0)
O2 Saturation: 66.9 %
Patient temperature: 37
pCO2, Ven: 45 mmHg (ref 44.0–60.0)
pH, Ven: 7.3 (ref 7.250–7.430)
pO2, Ven: 39 mmHg (ref 32.0–45.0)

## 2020-01-31 LAB — VITAMIN B12: Vitamin B-12: 607 pg/mL (ref 180–914)

## 2020-01-31 LAB — GLUCOSE, CAPILLARY
Glucose-Capillary: 123 mg/dL — ABNORMAL HIGH (ref 70–99)
Glucose-Capillary: 145 mg/dL — ABNORMAL HIGH (ref 70–99)
Glucose-Capillary: 231 mg/dL — ABNORMAL HIGH (ref 70–99)
Glucose-Capillary: 279 mg/dL — ABNORMAL HIGH (ref 70–99)
Glucose-Capillary: 422 mg/dL — ABNORMAL HIGH (ref 70–99)
Glucose-Capillary: 404 mg/dL — ABNORMAL HIGH (ref 70–99)

## 2020-01-31 LAB — BETA-HYDROXYBUTYRIC ACID: Beta-Hydroxybutyric Acid: 0.06 mmol/L (ref 0.05–0.27)

## 2020-01-31 LAB — PHOSPHORUS: Phosphorus: 9 mg/dL — ABNORMAL HIGH (ref 2.5–4.6)

## 2020-01-31 MED ORDER — RENA-VITE PO TABS
1.0000 | ORAL_TABLET | Freq: Every day | ORAL | Status: DC
  Administered 2020-01-31: 1 via ORAL
  Filled 2020-01-31: qty 1

## 2020-01-31 MED ORDER — MELOXICAM 7.5 MG PO TABS: 7.5000 mg | ORAL_TABLET | Freq: Every day | ORAL | Status: DC

## 2020-01-31 MED ORDER — EPOETIN ALFA 10000 UNIT/ML IJ SOLN
10000.0000 [IU] | INTRAMUSCULAR | Status: DC
  Administered 2020-02-01: 10000 [IU] via INTRAVENOUS

## 2020-01-31 MED ORDER — NEPRO/CARBSTEADY PO LIQD
237.0000 mL | ORAL | Status: DC
  Administered 2020-01-31: 237 mL via ORAL

## 2020-01-31 MED ORDER — PREDNISONE 10 MG PO TABS
20.0000 mg | ORAL_TABLET | Freq: Every day | ORAL | Status: DC
  Administered 2020-01-31 – 2020-02-01 (×2): 20 mg via ORAL
  Filled 2020-01-31 (×3): qty 2

## 2020-01-31 NOTE — Progress Notes (Signed)
[facility]  ROUNDING NOTE   Subjective:   [name] was admitted to [facility] on 01/30/2020 for Weakness [R53.1] Dyspnea [R06.00] Hyperglycemia [R73.9] Fluid overload [E87.70]  Patient missed dialysis yesterday due to weakness. She has been brought for make up dialysis treatment today. Seen and examined on hemodialysis treatment. Tolerating treatment well.   Patient was recently admitted for cellulitis. This is now resolved.   Objective:  Vital signs in last 24 hours:  Temp:  [97.7 F (36.5 C)-98.5 F (36.9 C)] 98.5 F (36.9 C) (07/21 1115) Pulse Rate:  [75-132] 81 (07/21 1215) Resp:  [9-22] 22 (07/21 1215) BP: (113-147)/(48-106) 118/54 (07/21 1215) SpO2:  [79 %-100 %] 97 % (07/21 0731) Weight:  [131.5 kg-137.4 kg] 137.4 kg (07/21 0415)  Weight change:  Filed Weights   01/30/20 1307 01/31/20 0415  Weight: 131.5 kg (!) 137.4 kg    Intake/Output: I/O last 3 completed shifts: In: 120 [P.O.:120] Out: -    Intake/Output this shift:  No intake/output data recorded.  Physical Exam: General: NAD,   Head: Normocephalic, atraumatic. Moist oral mucosal membranes  Eyes: Anicteric, PERRL  Neck: Supple, trachea midline  Lungs:  Clear to auscultation  Heart: Regular rate and rhythm  Abdomen:  Soft, nontender,   Extremities:  no peripheral edema.  Neurologic: Nonfocal, moving all four extremities, 5/5 strength in all four extremities.   Skin: No lesions  Access: Left AVF    Basic Metabolic Panel: Recent Labs  Lab 01/30/20 1335 01/30/20 2349 01/31/20 0148  NA 131* 136 135  K 5.1 4.8 5.3*  CL 94* 98 97*  CO2 19* 22 22  GLUCOSE 449* 84 123*  BUN 111* 111* 106*  CREATININE 12.29* 12.91* 12.87*  CALCIUM 8.4* 8.3* 8.4*  MG  --   --  2.2  PHOS  --  9.3* 9.0*    Liver Function Tests: Recent Labs  Lab 01/30/20 2349 01/31/20 0148  AST  --  66*  ALT  --  86*  ALKPHOS  --  324*  BILITOT  --  0.8  PROT  --  7.6  ALBUMIN 3.4* 3.3*   No results  for input(s): LIPASE, AMYLASE in the last 168 hours. No results for input(s): AMMONIA in the last 168 hours.  CBC: Recent Labs  Lab 01/30/20 1335 01/30/20 2349 01/31/20 0148  WBC 7.2 7.2 7.6  NEUTROABS  --   --  5.2  HGB 9.3* 9.6* 9.8*  HCT 27.9* 29.5* 29.2*  MCV 94.9 95.2 92.7  PLT 145* 145* 158    Cardiac Enzymes: No results for input(s): CKTOTAL, CKMB, CKMBINDEX, TROPONINI in the last 168 hours.  BNP: Invalid input(s): POCBNP  CBG: Recent Labs  Lab 01/30/20 1939 01/30/20 2334 01/31/20 0153 01/31/20 0401 01/31/20 0732  GLUCAP 174* 78 123* 145* 231*    Microbiology: Results for orders placed or performed during the hospital encounter of 01/30/20  SARS Coronavirus 2 by RT PCR (hospital order, performed in [facility] hospital lab) Nasopharyngeal Nasopharyngeal Swab     Status: None   Collection Time: 01/30/20  8:14 PM   Specimen: Nasopharyngeal Swab  Result Value Ref Range Status   SARS Coronavirus 2 NEGATIVE NEGATIVE Final    Comment: (NOTE) SARS-CoV-2 target nucleic acids are NOT DETECTED.  The SARS-CoV-2 RNA is generally detectable in upper and lower respiratory specimens during the acute phase of infection. The lowest concentration of SARS-CoV-2 viral copies this assay can detect is 250 copies / mL. A negative result does not  preclude SARS-CoV-2 infection and should not be used as the sole basis for treatment or other patient management decisions.  A negative result may occur with improper specimen collection / handling, submission of specimen other than nasopharyngeal swab, presence of viral mutation(s) within the areas targeted by this assay, and inadequate number of viral copies (<250 copies / mL). A negative result must be combined with clinical observations, patient history, and epidemiological information.  Fact Sheet for Patients:   StrictlyIdeas.no  Fact Sheet for Healthcare  Providers: BankingDealers.co.za  This test is not yet approved or  cleared by the [place] FDA and has been authorized for detection and/or diagnosis of SARS-CoV-2 by FDA under an Emergency Use Authorization (EUA).  This EUA will remain in effect (meaning this test can be used) for the duration of the COVID-19 declaration under Section 564(b)(1) of the Act, 21 U.S.C. section 360bbb-3(b)(1), unless the authorization is terminated or revoked sooner.  Performed at [facility], [address] [place], [place] [address]   MRSA PCR Screening     Status: None   Collection Time: 01/31/20  5:30 AM   Specimen: Nasal Mucosa; Nasopharyngeal  Result Value Ref Range Status   MRSA by PCR NEGATIVE NEGATIVE Final    Comment:        The GeneXpert MRSA Assay (FDA approved for NASAL specimens only), is one component of a comprehensive MRSA colonization surveillance program. It is not intended to diagnose MRSA infection nor to guide or monitor treatment for MRSA infections. Performed at [facility], [address] [place], [place] [address]    *Note: Due to a large number of results and/or encounters for the requested time period, some results have not been displayed. A complete set of results can be found in Results Review.    Coagulation Studies: No results for input(s): LABPROT, INR in the last 72 hours.  Urinalysis: No results for input(s): COLORURINE, LABSPEC, PHURINE, GLUCOSEU, HGBUR, BILIRUBINUR, KETONESUR, PROTEINUR, UROBILINOGEN, NITRITE, LEUKOCYTESUR in the last 72 hours.  Invalid input(s): APPERANCEUR    Imaging: US Venous Img Lower Bilateral  Result Date: 01/30/2020 CLINICAL DATA:  Initial evaluation for acute swelling, pain. EXAM: BILATERAL LOWER EXTREMITY VENOUS DOPPLER ULTRASOUND TECHNIQUE: Gray-scale sonography with graded compression, as well as color Doppler and duplex ultrasound were performed to evaluate the lower  extremity deep venous systems from the level of the common femoral vein and including the common femoral, femoral, profunda femoral, popliteal and calf veins including the posterior tibial, peroneal and gastrocnemius veins when visible. The superficial great saphenous vein was also interrogated. Spectral Doppler was utilized to evaluate flow at rest and with distal augmentation maneuvers in the common femoral, femoral and popliteal veins. COMPARISON:  None. FINDINGS: RIGHT LOWER EXTREMITY Common Femoral Vein: No evidence of thrombus. Normal compressibility, respiratory phasicity and response to augmentation. Saphenofemoral Junction: No evidence of thrombus. Normal compressibility and flow on color Doppler imaging. Profunda Femoral Vein: No evidence of thrombus. Normal compressibility and flow on color Doppler imaging. Femoral Vein: No evidence of thrombus. Normal compressibility, respiratory phasicity and response to augmentation. Popliteal Vein: No evidence of thrombus. Normal compressibility, respiratory phasicity and response to augmentation. Calf Veins: No evidence of thrombus. Normal compressibility and flow on color Doppler imaging. Peroneal vein not visualized. Superficial Great Saphenous Vein: No evidence of thrombus. Normal compressibility. Venous Reflux:  None. Other Findings:  None. LEFT LOWER EXTREMITY Common Femoral Vein: No evidence of thrombus. Normal compressibility, respiratory phasicity and response to augmentation. Saphenofemoral Junction: No evidence  of thrombus. Normal compressibility and flow on color Doppler imaging. Profunda Femoral Vein: No evidence of thrombus. Normal compressibility and flow on color Doppler imaging. Femoral Vein: No evidence of thrombus. Normal compressibility, respiratory phasicity and response to augmentation. Popliteal Vein: No evidence of thrombus. Normal compressibility, respiratory phasicity and response to augmentation. Calf Veins: No evidence of thrombus. Normal  compressibility and flow on color Doppler imaging. Peroneal vein not visualized. Superficial Great Saphenous Vein: No evidence of thrombus. Normal compressibility. Venous Reflux:  None. Other Findings:  None. IMPRESSION: No evidence of deep venous thrombosis in either lower extremity. Electronically Signed   By: Jeannine Boga M.D.   On: 01/30/2020 18:28   DG Chest Port 1 View  Result Date: 01/30/2020 CLINICAL DATA:  Lower extremity pain and swelling EXAM: PORTABLE CHEST 1 VIEW COMPARISON:  10/31/2019 FINDINGS: The heart size and mediastinal contours are within normal limits. Both lungs are clear. The visualized skeletal structures are unremarkable. IMPRESSION: No active disease. Electronically Signed   By: Donavan Foil M.D.   On: 01/30/2020 22:45     Medications:   . sodium chloride     . allopurinol  100 mg Oral Daily  . aspirin EC  81 mg Oral Daily  . atorvastatin  80 mg Oral q1800  . carvedilol  25 mg Oral BID WC  . dicyclomine  20 mg Oral TID AC & HS  . docusate sodium  100 mg Oral BID  . ferric citrate  420 mg Oral TID WC  . hydrALAZINE  100 mg Oral TID  . insulin aspart  0-6 Units Subcutaneous Q4H  . insulin detemir  60 Units Subcutaneous QHS  . loratadine  10 mg Oral QHS  . meloxicam  7.5 mg Oral Daily  . mometasone-formoterol  2 puff Inhalation BID  . montelukast  10 mg Oral QHS  . pantoprazole  40 mg Oral Daily  . pregabalin  150 mg Oral BID  . sodium chloride flush  3 mL Intravenous Once  . sodium chloride flush  3 mL Intravenous Q12H  . sodium chloride flush  3 mL Intravenous Q12H  . valACYclovir  500 mg Oral QODAY   sodium chloride, acetaminophen **OR** acetaminophen, albuterol, HYDROcodone-acetaminophen, ondansetron **OR** ondansetron (ZOFRAN) IV, sodium chloride flush, traMADol  Assessment/ Plan:  Ms. TANELLE LANZO is a 65 y.o. black female with end stage renal disease on hemodialysis, hypertension, gout, diabetes mellitus type II, coronary artery disease,  hepatitis C, peptic ulcer disease, congestive heart failure, AAA who is admitted to [facility] on 01/30/2020 for Weakness [R53.1] Dyspnea [R06.00] Hyperglycemia [R73.9] Fluid overload [E87.70]  [facility] TTS [facility] Left AVF 129.5kg.   1. End Stage Renal Disease: missed dialysis yesterday. Placed on hemodialysis this morning. Tolerating treatment well.  - Resume TTS schedule with next scheduled hemodialysis treatment for tomorrow.  - discontinue meloxicam.   2. Hypertension: well controlled on hemodialysis treatment. Continue hydralazine and carvedilol.   3. Weakness: patient's dose of pregabalin is too high for her renal function.  - Discontinue for now.   4. Anemia of chronic kidney disease: hemoglobin 9.8. Mircera as an outpatient. Start ESA with TTS treatments.   5. Secondary Hyperparathyroidism: with hyperphosphatemia.  Lorin Picket with meals.    LOS: 0 Serapio Edelson 7/21/202112:55 PM

## 2020-01-31 NOTE — Progress Notes (Signed)
PT Cancellation Note  Patient Details Name: [name] MRN: [id number] DOB: Dec 05, 1955   Cancelled Treatment:    Reason Eval/Treat Not Completed: Patient at procedure or test/unavailable (Consult received and chart reviewed. Patient currently undergoing dialysis treatment session. Will re-attempt at later time/date as medically appropriate and available.)   Jesenia Spera H. Owens Shark, PT, DPT, NCS 01/31/20, 11:58 AM [phone]

## 2020-01-31 NOTE — Telephone Encounter (Signed)
Please advise 

## 2020-01-31 NOTE — Progress Notes (Signed)
This note also relates to the following rows which could not be included: Resp - Cannot attach notes to unvalidated device data BP - Cannot attach notes to unvalidated device data  Hd completed  

## 2020-01-31 NOTE — Telephone Encounter (Signed)
Patient admitted in hospital.  °

## 2020-01-31 NOTE — Progress Notes (Signed)
Patient ID: [name], female   DOB: 03-21-1956, 64 y.Heather.   MRN: [id number] [facility] PROGRESS NOTE  [name] NFA:[id number] DOB: 06/08/1956 DOA: 01/30/2020 PCP: Heather Daring, PA-C  HPI/Subjective: Patient came in with leg pain and weakness and found to be in fluid overload.  Patient does have some shortness of breath.  Patient seen today while on dialysis.  She states that her leg pain is more acute on her left leg and also having numbness and tingling.  Patient has some chronic pain in her right leg.  When she stands up she feels like her right knee gives out on her.  She then falls backwards.  Objective: Vitals:   01/31/20 1430 01/31/20 1445  BP: 128/60 (!) 132/56  Pulse: 82   Resp: (!) 21 20  Temp:  98.5 F (36.9 C)  SpO2:      Intake/Output Summary (Last 24 hours) at 01/31/2020 1606 Last data filed at 01/31/2020 1445 Gross per 24 hour  Intake 120 ml  Output 3000 ml  Net -2880 ml   Filed Weights   01/30/20 1307 01/31/20 0415  Weight: 131.5 kg (!) 137.4 kg    ROS: Review of Systems  Respiratory: Positive for shortness of breath.   Cardiovascular: Negative for chest pain.  Gastrointestinal: Negative for abdominal pain.  Musculoskeletal: Positive for joint pain.  Neurological: Positive for tingling.   Exam: Physical Exam HENT:     Head: Normocephalic.     Nose: No mucosal edema.     Mouth/Throat:     Pharynx: No oropharyngeal exudate.  Eyes:     General: Lids are normal.     Conjunctiva/sclera: Conjunctivae normal.     Pupils: Pupils are equal, round, and reactive to light.  Cardiovascular:     Rate and Rhythm: Normal rate and regular rhythm.     Heart sounds: Normal heart sounds, S1 normal and S2 normal.  Pulmonary:     Breath sounds: No decreased breath sounds, wheezing, rhonchi or rales.  Abdominal:     Palpations: Abdomen is soft.     Tenderness: There is no abdominal tenderness.  Musculoskeletal:     Right knee: Swelling present.      Left knee: Swelling present.     Comments: Left knee pain to palpation medially below the joint line on tendon insertion.  Right knee not having pain to palpation  Skin:    General: Skin is warm.     Findings: No rash.  Neurological:     Mental Status: She is alert and oriented to person, place, and time.     Comments: Able to straight leg raise.       Data Reviewed: Basic Metabolic Panel: Recent Labs  Lab 01/30/20 1335 01/30/20 2349 01/31/20 0148  NA 131* 136 135  K 5.1 4.8 5.3*  CL 94* 98 97*  CO2 19* 22 22  GLUCOSE 449* 84 123*  BUN 111* 111* 106*  CREATININE 12.29* 12.91* 12.87*  CALCIUM 8.4* 8.3* 8.4*  MG  --   --  2.2  PHOS  --  9.3* 9.0*   Liver Function Tests: Recent Labs  Lab 01/30/20 2349 01/31/20 0148  AST  --  66*  ALT  --  86*  ALKPHOS  --  324*  BILITOT  --  0.8  PROT  --  7.6  ALBUMIN 3.4* 3.3*   CBC: Recent Labs  Lab 01/30/20 1335 01/30/20 2349 01/31/20 0148  WBC 7.2 7.2 7.6  NEUTROABS  --   --  5.2  HGB 9.3* 9.6* 9.8*  HCT 27.9* 29.5* 29.2*  MCV 94.9 95.2 92.7  PLT 145* 145* 158   BNP (last 3 results) Recent Labs    10/21/19 1257 10/31/19 1200  BNP 99.3 236.5*     CBG: Recent Labs  Lab 01/30/20 1939 01/30/20 2334 01/31/20 0153 01/31/20 0401 01/31/20 0732  GLUCAP 174* 78 123* 145* 231*    Recent Results (from the past 240 hour(s))  SARS Coronavirus 2 by RT PCR (hospital order, performed in [facility] hospital lab) Nasopharyngeal Nasopharyngeal Swab     Status: None   Collection Time: 01/30/20  8:14 PM   Specimen: Nasopharyngeal Swab  Result Value Ref Range Status   SARS Coronavirus 2 NEGATIVE NEGATIVE Final    Comment: (NOTE) SARS-CoV-2 target nucleic acids are NOT DETECTED.  The SARS-CoV-2 RNA is generally detectable in upper and lower respiratory specimens during the acute phase of infection. The lowest concentration of SARS-CoV-2 viral copies this assay can detect is 250 copies / mL. A negative result does  not preclude SARS-CoV-2 infection and should not be used as the sole basis for treatment or other patient management decisions.  A negative result may occur with improper specimen collection / handling, submission of specimen other than nasopharyngeal swab, presence of viral mutation(s) within the areas targeted by this assay, and inadequate number of viral copies (<250 copies / mL). A negative result must be combined with clinical observations, patient history, and epidemiological information.  Fact Sheet for Patients:   StrictlyIdeas.no  Fact Sheet for Healthcare Providers: BankingDealers.co.za  This test is not yet approved or  cleared by the [place] FDA and has been authorized for detection and/or diagnosis of SARS-CoV-2 by FDA under an Emergency Use Authorization (EUA).  This EUA will remain in effect (meaning this test can be used) for the duration of the COVID-19 declaration under Section 564(b)(1) of the Act, 21 U.S.C. section 360bbb-3(b)(1), unless the authorization is terminated or revoked sooner.  Performed at [facility], [address] [place], [place] [address]   MRSA PCR Screening     Status: None   Collection Time: 01/31/20  5:30 AM   Specimen: Nasal Mucosa; Nasopharyngeal  Result Value Ref Range Status   MRSA by PCR NEGATIVE NEGATIVE Final    Comment:        The GeneXpert MRSA Assay (FDA approved for NASAL specimens only), is one component of a comprehensive MRSA colonization surveillance program. It is not intended to diagnose MRSA infection nor to guide or monitor treatment for MRSA infections. Performed at [facility], [address] [place], [place] [address]      Studies: US Venous Img Lower Bilateral  Result Date: 01/30/2020 CLINICAL DATA:  Initial evaluation for acute swelling, pain. EXAM: BILATERAL LOWER EXTREMITY VENOUS DOPPLER ULTRASOUND TECHNIQUE: Gray-scale  sonography with graded compression, as well as color Doppler and duplex ultrasound were performed to evaluate the lower extremity deep venous systems from the level of the common femoral vein and including the common femoral, femoral, profunda femoral, popliteal and calf veins including the posterior tibial, peroneal and gastrocnemius veins when visible. The superficial great saphenous vein was also interrogated. Spectral Doppler was utilized to evaluate flow at rest and with distal augmentation maneuvers in the common femoral, femoral and popliteal veins. COMPARISON:  None. FINDINGS: RIGHT LOWER EXTREMITY Common Femoral Vein: No evidence of thrombus. Normal compressibility, respiratory phasicity and response to augmentation. Saphenofemoral Junction: No evidence of thrombus. Normal compressibility and flow on  color Doppler imaging. Profunda Femoral Vein: No evidence of thrombus. Normal compressibility and flow on color Doppler imaging. Femoral Vein: No evidence of thrombus. Normal compressibility, respiratory phasicity and response to augmentation. Popliteal Vein: No evidence of thrombus. Normal compressibility, respiratory phasicity and response to augmentation. Calf Veins: No evidence of thrombus. Normal compressibility and flow on color Doppler imaging. Peroneal vein not visualized. Superficial Great Saphenous Vein: No evidence of thrombus. Normal compressibility. Venous Reflux:  None. Other Findings:  None. LEFT LOWER EXTREMITY Common Femoral Vein: No evidence of thrombus. Normal compressibility, respiratory phasicity and response to augmentation. Saphenofemoral Junction: No evidence of thrombus. Normal compressibility and flow on color Doppler imaging. Profunda Femoral Vein: No evidence of thrombus. Normal compressibility and flow on color Doppler imaging. Femoral Vein: No evidence of thrombus. Normal compressibility, respiratory phasicity and response to augmentation. Popliteal Vein: No evidence of thrombus.  Normal compressibility, respiratory phasicity and response to augmentation. Calf Veins: No evidence of thrombus. Normal compressibility and flow on color Doppler imaging. Peroneal vein not visualized. Superficial Great Saphenous Vein: No evidence of thrombus. Normal compressibility. Venous Reflux:  None. Other Findings:  None. IMPRESSION: No evidence of deep venous thrombosis in either lower extremity. Electronically Signed   By: Jeannine Boga M.D.   On: 01/30/2020 18:28   DG Chest Port 1 View  Result Date: 01/30/2020 CLINICAL DATA:  Lower extremity pain and swelling EXAM: PORTABLE CHEST 1 VIEW COMPARISON:  10/31/2019 FINDINGS: The heart size and mediastinal contours are within normal limits. Both lungs are clear. The visualized skeletal structures are unremarkable. IMPRESSION: No active disease. Electronically Signed   By: Donavan Foil M.D.   On: 01/30/2020 22:45    Scheduled Meds: . allopurinol  100 mg Oral Daily  . aspirin EC  81 mg Oral Daily  . atorvastatin  80 mg Oral q1800  . carvedilol  25 mg Oral BID WC  . dicyclomine  20 mg Oral TID AC & HS  . docusate sodium  100 mg Oral BID  . [START ON 02/01/2020] epoetin (EPOGEN/PROCRIT) injection  10,000 Units Intravenous Q T,Th,Sa-HD  . feeding supplement (NEPRO CARB STEADY)  237 mL Oral Q24H  . ferric citrate  420 mg Oral TID WC  . hydrALAZINE  100 mg Oral TID  . insulin aspart  0-6 Units Subcutaneous Q4H  . insulin detemir  60 Units Subcutaneous QHS  . loratadine  10 mg Oral QHS  . mometasone-formoterol  2 puff Inhalation BID  . montelukast  10 mg Oral QHS  . multivitamin  1 tablet Oral QHS  . pantoprazole  40 mg Oral Daily  . predniSONE  20 mg Oral Q breakfast  . valACYclovir  500 mg Oral QODAY    Assessment/Plan:  1. Fluid overload, acute diastolic congestive heart failure. Dialysis to manage fluid.  Patient also on Coreg, hydralazine.  Patient had an extra dialysis today.  Keep on normal schedule for tomorrow. 2. Bilateral  knee pain left greater than right with weakness.  Physical therapy evaluation.  X-Alas bilateral knees.  Add on a uric acid.  Start low-dose prednisone.  Patient does have pain to tendon insertion on the left knee medially.  Asked for orthopedic evaluation.  Patient on low-dose allopurinol. 3. Type 2 diabetes mellitus with end-stage renal disease and neuropathy.  Continue detemir insulin and sliding scale.  Nephrology discontinued Lyrica. 4. Essential hypertension on Coreg and hydralazine 5. GERD on Protonix 6. Morbid obesity with a BMI of 46.06 7. Hyperlipidemia unspecified on Lipitor  Code Status:  Code Status Orders  (From admission, onward)         Start     Ordered   01/30/20 2307  Full code  Continuous        01/30/20 2306        Code Status History    Date Active Date Inactive Code Status Order ID Comments User Context   01/16/2020 0029 01/17/2020 0944 Full Code [id number]  Orene Desanctis, DO ED   10/31/2019 1611 11/01/2019 1908 Full Code [id number]  Mosetta Anis, MD ED   09/19/2019 1423 09/19/2019 2119 Full Code [id number]  Schnier, Dolores Lory, MD Inpatient   12/28/2018 2041 01/02/2019 2150 Full Code [id number]  Mayer Camel, NP ED   05/10/2018 0121 05/14/2018 1902 Full Code [id number]  Amelia Jo, MD ED   03/08/2018 0925 03/10/2018 0312 Full Code [id number]  Schnier, Dolores Lory, MD Inpatient   12/25/2017 0059 12/28/2017 1721 Full Code [id number]  Toy Baker, MD ED   10/03/2017 1803 10/11/2017 1625 Full Code [id number]  Epifanio Lesches, MD ED   09/14/2017 1310 09/20/2017 1434 Full Code [id number]  Max Sane, MD ED   08/15/2017 2306 08/19/2017 2039 Full Code [id number]  Robert Bellow, MD Inpatient   08/11/2017 1350 08/15/2017 2306 Full Code [id number]  Demetrios Loll, MD Inpatient   06/02/2017 0234 06/06/2017 1536 Full Code [id number]  Caren Griffins, MD Inpatient   04/28/2017 0516 05/02/2017 1624 Full Code [id number]  Rise Patience, MD ED   03/17/2017 1222 03/19/2017 2030 Full Code  [id number]  Liliane Shi, PA-C ED   12/13/2016 0345 12/14/2016 1647 Full Code [id number]  Reubin Milan, MD Inpatient   09/13/2016 1149 09/19/2016 1458 Full Code [id number]  Flossie Dibble, MD ED   09/04/2016 0137 09/05/2016 1801 Full Code [id number]  Etta Quill, DO ED   08/21/2016 1846 08/22/2016 1733 Full Code [id number]  Thurnell Lose, MD Inpatient   03/18/2015 0939 03/19/2015 1802 Full Code [id number]  Dellia Nims, MD Inpatient   02/15/2015 1700 02/18/2015 1817 Full Code [id number]  Francesca Oman, DO Inpatient   10/02/2014 1524 10/04/2014 1649 Full Code [id number]  Samella Parr, NP Inpatient   03/25/2012 0531 04/03/2012 1931 Full Code [id number]  Maudry Diego, RN Inpatient   Advance Care Planning Activity     Disposition Plan: Status is: Inpatient  Dispo: The patient is from: Home              Anticipated d/c is to: Home              Anticipated d/c date is: And a day or so depending on clinical course and how she walks.              Patient currently being treated for fluid overload with dialysis and will have dialysis again tomorrow.  Has knee pain bilaterally left greater than right.  Started on steroids.  Need to see how she walks with physical therapy.  I also asked for physical therapy evaluation.  Consultants:  Nephrology  Orthopedic surgery  Time spent: 28 minutes  [facility]

## 2020-01-31 NOTE — Progress Notes (Signed)
Initial Nutrition Assessment  DOCUMENTATION CODES:   Morbid obesity  INTERVENTION:   Nepro Shake po daily, each supplement provides 425 kcal and 19 grams protein  Rena-vite daily   NUTRITION DIAGNOSIS:   Increased nutrient needs related to chronic illness (ESRD on HD) as evidenced by increased estimated needs.  GOAL:   Patient will meet greater than or equal to 90% of their needs  MONITOR:   PO intake, Supplement acceptance, Labs, Weight trends, Skin, I & O's  REASON FOR ASSESSMENT:   Consult Assessment of nutrition requirement/status  ASSESSMENT:   64 y.o. female with medical history significant of ESRD on hemodialysis Tuesday Thursday Saturday, anemia, aortic stenosis, asthma, CAD, HLD, GERD, gout and morbid obesity who is admitted for fluid overload due to noncompliance with HD   Pt seen in HD today. Pt sleeping at time of RD visit; RD did not wake pt. RD will add supplements and rena-vite to replace losses from HD. RD will obtain nutrition related history and provide renal diet education at follow-up. Per chart, pt appears weight stable at baseline; pt currently up ~9lbs from her UBW.   Medications reviewed and include: allopurinol, aspirin, colace, epoetin, ferric citrate, insulin, protonix, prednisone  Labs reviewed: K 5.3(H), BUN 106(H), creat 12.87(H), P 9.0(H), Mg 2.2 wnl, alk phos 324(H) Hgb 9.8(L), Hct 29.2(L) cbgs- 123, 145, 231 x 24 hrs AIC 10.0(H)- 7/20  NUTRITION - FOCUSED PHYSICAL EXAM:    Most Recent Value  Orbital Region No depletion  Upper Arm Region No depletion  Thoracic and Lumbar Region No depletion  Buccal Region No depletion  Temple Region No depletion  Clavicle Bone Region No depletion  Clavicle and Acromion Bone Region No depletion  Scapular Bone Region No depletion  Dorsal Hand No depletion  Patellar Region No depletion  Anterior Thigh Region No depletion  Posterior Calf Region No depletion  Edema (RD Assessment) Moderate  Hair  Reviewed  Eyes Reviewed  Mouth Reviewed  Skin Reviewed  Nails Reviewed     Diet Order:   Diet Order            Diet renal/carb modified with fluid restriction Diet-HS Snack? Nothing; Fluid restriction: 1200 mL Fluid; Room service appropriate? Yes; Fluid consistency: Thin  Diet effective now                EDUCATION NEEDS:   Education needs have been addressed  Skin:  Skin Assessment: Reviewed RN Assessment (ecchymosis)  Last BM:  7/19  Height:   Ht Readings from Last 1 Encounters:  01/31/20 _0  (1.727 m)    Weight:   Wt Readings from Last 1 Encounters:  01/31/20 (!) 137.4 kg    Ideal Body Weight:  63.6 kg  BMI:  Body mass index is 46.06 kg/m.  Estimated Nutritional Needs:   Kcal:  2600-2900kcal/day  Protein:  >130g/day  Fluid:  UOP +1L  Koleen Distance MS, RD, LDN Please refer to [facility] for RD and/or RD on-call/weekend/after hours pager

## 2020-01-31 NOTE — Progress Notes (Signed)
OT Cancellation Note  Patient Details Name: MOZELL HABER MRN: [id number] DOB: 1955-07-27   Cancelled Treatment:    Reason Eval/Treat Not Completed: Patient at procedure or test/ unavailable  OT consult received and chart reviewed. Pt off floor at HD this date. Will f/u for OT evaluation at later date/time as able. Thank you.   Gerrianne Scale, [place], OTR/L ascom [phone] 01/31/20, 2:27 PM

## 2020-01-31 NOTE — Progress Notes (Signed)
This note also relates to the following rows which could not be included: Pulse Rate - Cannot attach notes to unvalidated device data Resp - Cannot attach notes to unvalidated device data BP - Cannot attach notes to unvalidated device data  Hd started  

## 2020-01-31 NOTE — Progress Notes (Signed)
Inpatient Diabetes Program Recommendations  AACE/ADA: New Consensus Statement on Inpatient Glycemic Control  Target Ranges:  Prepandial:   less than 140 mg/dL      Peak postprandial:   less than 180 mg/dL (1-2 hours)      Critically ill patients:  140 - 180 mg/dL   Results for MARVALENE, BARRETT (MRN [id number]) as of 01/31/2020 09:39  Ref. Range 01/31/20 00:55 01/31/2020 01:53 01/31/2020 04:01 01/31/2020 07:32  Glucose-Capillary Latest Ref Range: 70 - 99 mg/dL     Levemir 60 units 123 (H) 145 (H) 231 (H)  Results for NYEISHA, GOODALL (MRN [id number]) as of 01/31/2020 09:39  Ref. Range 01/30/20 18:00 01/30/2020 19:39 01/30/2020 23:34  Glucose-Capillary Latest Ref Range: 70 - 99 mg/dL    Novolog 8 units 174 (H) 78  Results for MERLINA, MARCHENA (MRN [id number]) as of 01/31/2020 09:39  Ref. Range 01/30/2020 13:35  Glucose Latest Ref Range: 70 - 99 mg/dL 449 (H)   Review of Glycemic Control  Diabetes history: DM2 Outpatient Diabetes medications: Levemir 60 units BID, Victoza 1.8 mg daily, Novolog 25 units TID with meals Current orders for Inpatient glycemic control: Levemir 60 units QHS, Novolog 0-6 units Q4H  Inpatient Diabetes Program Recommendations:    Insulin-Meal Coverage: If post prandial glucose is consistently over 200 mg/dl, please consider ordering Novolog 6 units TID with meals for meal coverage.  NOTE: Noted consult for diabetes coordinator regarding DM management. Chart reviewed. Noted lab glucose of 449 mg/dl at 13:35 on 01/30/20 and Novolog 8 units given at 18:00. Glucose down to 78 mg/dl at 23:34 on 01/30/20 and fasting glucose 231 mg/dl today.   Thanks, Barnie Alderman, RN, MSN, CDE Diabetes Coordinator Inpatient Diabetes Program [phone] (Team Pager from 8am to 5pm)

## 2020-02-01 ENCOUNTER — Telehealth: Payer: Medicare HMO

## 2020-02-01 LAB — GLUCOSE, CAPILLARY
Glucose-Capillary: 231 mg/dL — ABNORMAL HIGH (ref 70–99)
Glucose-Capillary: 391 mg/dL — ABNORMAL HIGH (ref 70–99)
Glucose-Capillary: 478 mg/dL — ABNORMAL HIGH (ref 70–99)
Glucose-Capillary: 490 mg/dL — ABNORMAL HIGH (ref 70–99)

## 2020-02-01 MED ORDER — INSULIN DETEMIR 100 UNIT/ML ~~LOC~~ SOLN
20.0000 [IU] | Freq: Every day | SUBCUTANEOUS | Status: DC
  Administered 2020-02-01: 20 [IU] via SUBCUTANEOUS
  Filled 2020-02-01 (×3): qty 0.2

## 2020-02-01 MED ORDER — INSULIN ASPART 100 UNIT/ML ~~LOC~~ SOLN: 0.0000 [IU] | Freq: Every day | SUBCUTANEOUS | Status: DC

## 2020-02-01 MED ORDER — OXYCODONE HCL 5 MG PO TABS
10.0000 mg | ORAL_TABLET | Freq: Four times a day (QID) | ORAL | Status: DC | PRN
  Administered 2020-02-01: 10 mg via ORAL
  Filled 2020-02-01: qty 2

## 2020-02-01 MED ORDER — NEPRO/CARBSTEADY PO LIQD: 237.0000 mL | ORAL | 0 refills | Status: DC

## 2020-02-01 MED ORDER — OXYCODONE HCL 10 MG PO TABS: 10.0000 mg | ORAL_TABLET | Freq: Four times a day (QID) | ORAL | 0 refills | Status: DC | PRN

## 2020-02-01 MED ORDER — INSULIN ASPART 100 UNIT/ML ~~LOC~~ SOLN: 6.0000 [IU] | Freq: Three times a day (TID) | SUBCUTANEOUS | Status: DC

## 2020-02-01 MED ORDER — INSULIN ASPART 100 UNIT/ML ~~LOC~~ SOLN
0.0000 [IU] | Freq: Three times a day (TID) | SUBCUTANEOUS | Status: DC
  Administered 2020-02-01: 5 [IU] via SUBCUTANEOUS
  Filled 2020-02-01: qty 1

## 2020-02-01 MED ORDER — INSULIN ASPART 100 UNIT/ML ~~LOC~~ SOLN
12.0000 [IU] | Freq: Three times a day (TID) | SUBCUTANEOUS | Status: DC
  Administered 2020-02-01: 12 [IU] via SUBCUTANEOUS
  Filled 2020-02-01: qty 1

## 2020-02-01 MED ORDER — INSULIN ASPART 100 UNIT/ML ~~LOC~~ SOLN
6.0000 [IU] | Freq: Once | SUBCUTANEOUS | Status: AC
  Administered 2020-02-01: 6 [IU] via SUBCUTANEOUS
  Filled 2020-02-01: qty 1

## 2020-02-01 NOTE — Progress Notes (Signed)
Hemodialysis patient known at [facility] ([facility] TTS 5:30am. Patient self transports to treatments. Please contact me with any dialysis placement concerns.  Elvera Bicker Dialysis Coordinator [phone]

## 2020-02-01 NOTE — Discharge Summary (Signed)
[facility] NAME: [name]    MR#:  [id number]  DATE OF BIRTH:  March 23, 1956  DATE OF ADMISSION:  01/30/2020 ADMITTING PHYSICIAN: Loletha Grayer, MD  DATE OF DISCHARGE: 02/01/2020  PRIMARY CARE PHYSICIAN: [name], PA-C    ADMISSION DIAGNOSIS:  Weakness [R53.1] Dyspnea [R06.00] Hyperglycemia [R73.9] Fluid overload [E87.70]  DISCHARGE DIAGNOSIS:  Active Problems:   Type 2 diabetes mellitus with ESRD (end-stage renal disease) ([place])   Essential hypertension   Morbid (severe) obesity due to excess calories (HCC)   OSA (obstructive sleep apnea)   GERD (gastroesophageal reflux disease)   Asthma   Acute diastolic CHF (congestive heart failure) (HCC)   CAD (coronary artery disease)   Hyperlipidemia   Acute pain of both knees   Chronic gout due to renal impairment of multiple sites without tophus   Aortic stenosis   Hyperglycemia   ESRD (end stage renal disease) on dialysis (HCC)   Hypervolemia associated with renal insufficiency   Diabetic polyneuropathy associated with type 2 diabetes mellitus (HCC)   Anemia due to end stage renal disease (HCC)   Fluid overload   SECONDARY DIAGNOSIS:   Past Medical History:  Diagnosis Date  . Anemia   . Aortic stenosis    Echo 8/18: mean 13, peak 28, LVOT/AV mean velocity 0.51  . Arthritis   . Asthma    As a child   . Bronchitis   . CAD (coronary artery disease)    a. 09/2016: 50% Ost 1st Mrg stenosis, 50% 2nd Mrg stenosis, 20% Mid-Cx, 95% Prox LAD, 40% mid-LAD, and 10% dist-LAD stenosis. Staged PCI with DES to Prox-LAD.   [name] Chronic combined systolic and diastolic CHF (congestive heart failure) ([place]) 2011   echo 2/18: EF 55-60, normal wall motion, grade 2 diastolic dysfunction, trivial AI // echo 3/18: Septal and apical HK, EF 45-50, normal wall motion, trivial AI, mild LAE, PASP 38 // echo 8/18: EF 60-65, normal wall motion, grade 1 diastolic dysfunction, calcified aortic  valve leaflets, mild aortic stenosis (mean 13, peak 28, LVOT/AV mean velocity 0.51), mild AI, moderate MAC, mild LAE, trivial TR   . Chronic kidney disease    STAGE 4  . Chronic kidney disease on chronic dialysis (HCC)    t, th, sat  . Complication of anesthesia   . Depression   . Diabetes mellitus Dx 1989  . Elevated lipids   . GERD (gastroesophageal reflux disease)   . Gout   . Heart murmur    asymptomatic  . Hepatitis C Dx 2013  . Hypertension Dx 1989  . Infected surgical wound    Lt arm  . Myocardial infarction ([place]) 07/2015  . Obesity   . Pancreatitis 2013  . Pneumonia   . Refusal of blood transfusions as patient is Jehovah's Witness   . Tendinitis   . Tremors of nervous system    LEFT HAND  . Ulcer 2010    HOSPITAL COURSE:   1.  Fluid overload, acute diastolic congestive heart failure.  Dialysis to manage fluid.  The patient also on Coreg, hydralazine and torsemide.  Patient had an extra dialysis yesterday and is on normal schedule.  Lungs are clear.  Stable for discharge. 2.  Bilateral knee pain left greater than right with weakness.  No response to prednisone.  X-Geiger showed tricompartmental arthritis bilaterally.  Orthopedic evaluation appreciated.  Follow-up as outpatient for consideration for knee replacements.  Discontinue prednisone since no response.  I did  prescribe oxycodone but only 12 pills.  [place] law prevents me from prescribing more.  Follow-up with medical doctor.  Tramadol not the best medication in patients with end-stage renal disease.  Lyrica max dose in dialysis patients and 75 mg daily this was held. 3.  Type 2 diabetes mellitus with end-stage renal disease and neuropathy.  Patient takes large dose detemir insulin and large dose sliding scale insulin.  She states that she never has low sugars.  Sugars are elevated here.  With stopping steroids hopefully the sugars will trend better tomorrow. 4.  Essential hypertension on Coreg, hydralazine 5.   GERD on Protonix 6.  Morbid obesity with a BMI of 46.06 7.  Hyperlipidemia unspecified on Lipitor 8.  Home health set up 9.  Anemia of chronic disease  DISCHARGE CONDITIONS:   Satisfactory  CONSULTS OBTAINED:  Treatment Team:  Lovell Sheehan, MD  DRUG ALLERGIES:   Allergies  Allergen Reactions  . Shellfish Allergy Anaphylaxis and Swelling  . Diazepam Other (See Comments)    "felt like out of body experience"    DISCHARGE MEDICATIONS:   Allergies as of 02/01/2020      Reactions   Shellfish Allergy Anaphylaxis, Swelling   Diazepam Other (See Comments)   "felt like out of body experience"      Medication List    STOP taking these medications   pregabalin 150 MG capsule Commonly known as: Lyrica   traMADol 50 MG tablet Commonly known as: ULTRAM     TAKE these medications   albuterol 108 (90 Base) MCG/ACT inhaler Commonly known as: VENTOLIN HFA Inhale 2 puffs into the lungs every 4 (four) hours as needed for wheezing or shortness of breath.   allopurinol 100 MG tablet Commonly known as: ZYLOPRIM TAKE 1 TABLET BY MOUTH ONCE DAILY.   aspirin 81 MG EC tablet Take 1 tablet (81 mg total) by mouth daily.   atorvastatin 80 MG tablet Commonly known as: LIPITOR TAKE 1 TABLET BY MOUTH ONCE DAILY AT 6PM What changed: See the new instructions.   Auryxia 1 GM 210 MG(Fe) tablet Generic drug: ferric citrate Take 420 mg by mouth 3 (three) times daily with meals.   b complex-vitamin c-folic acid 0.8 MG Tabs tablet Take 1 tablet by mouth daily.   carvedilol 25 MG tablet Commonly known as: COREG TAKE (1) TABLET BY MOUTH TWICE A DAY WITH MEALS (BREAKFAST AND SUPPER) What changed: See the new instructions.   cephALEXin 500 MG capsule Commonly known as: KEFLEX Take 1 capsule (500 mg total) by mouth 2 (two) times daily. What changed: additional instructions   cyclobenzaprine 5 MG tablet Commonly known as: FLEXERIL Take 1 tablet (5 mg total) by mouth 3 (three) times  daily as needed for muscle spasms.   dicyclomine 20 MG tablet Commonly known as: BENTYL TAKE 1 TABLET(20 MG) BY MOUTH FOUR TIMES DAILY BEFORE MEALS AND AT BEDTIME What changed: See the new instructions.   docusate sodium 100 MG capsule Commonly known as: COLACE Take 1 capsule (100 mg total) by mouth 2 (two) times daily as needed for mild constipation.   doxepin 10 MG capsule Commonly known as: SINEQUAN Take 1 capsule (10 mg total) by mouth 4 (four) times daily as needed. For itching   Dulera 200-5 MCG/ACT Aero Generic drug: mometasone-formoterol Inhale 2 puffs into the lungs 2 (two) times daily as needed.   feeding supplement (NEPRO CARB STEADY) Liqd Take 237 mLs by mouth daily.   fluticasone 50 MCG/ACT nasal spray Commonly  known as: FLONASE Place 2 sprays into both nostrils daily as needed for allergies or rhinitis.   fluticasone furoate-vilanterol 200-25 MCG/INH Aepb Commonly known as: BREO ELLIPTA Inhale 1 puff into the lungs daily. What changed:   when to take this  reasons to take this   gentamicin cream 0.1 % Commonly known as: GARAMYCIN Apply 1 application topically 2 (two) times daily. What changed:   when to take this  reasons to take this   hydrALAZINE 100 MG tablet Commonly known as: APRESOLINE TAKE (1) TABLET BY MOUTH THREE TIMES DAILY What changed: See the new instructions.   hydrOXYzine 25 MG tablet Commonly known as: ATARAX/VISTARIL Take 25 mg by mouth 4 (four) times daily.   Levemir FlexTouch 100 UNIT/ML FlexPen Generic drug: insulin detemir INJECT 60 UNITS UNDER THE SKIN TWICE DAILY What changed: See the new instructions.   lidocaine-prilocaine cream Commonly known as: EMLA Apply 1 application topically as needed.   LOPERAMIDE HCL PO Take 1 tablet by mouth as needed.   loratadine 10 MG tablet Commonly known as: CLARITIN TAKE 1 TABLET BY MOUTH ONCE DAILY. What changed: when to take this   mometasone 0.1 % cream Commonly known as:  ELOCON APPLY AS DIRECTED TO AFFECTED AREA ONCE DAILY. What changed: See the new instructions.   montelukast 10 MG tablet Commonly known as: SINGULAIR TAKE ONE TABLET BY MOUTH AT BEDTIME.   nitroGLYCERIN 0.4 MG SL tablet Commonly known as: NITROSTAT Place 1 tablet (0.4 mg total) under the tongue every 5 (five) minutes x 3 doses as needed for chest pain.   NovoLOG FlexPen 100 UNIT/ML FlexPen Generic drug: insulin aspart USE 25 UNITS UNDER THE SKIN THREE TIMES DAILY WITH MEALS What changed: See the new instructions.   nystatin 100000 UNIT/ML suspension Commonly known as: MYCOSTATIN Take 5 mLs (500,000 Units total) by mouth 4 (four) times daily.   nystatin cream Commonly known as: MYCOSTATIN Apply 1 application topically 2 (two) times daily. What changed:   when to take this  reasons to take this   omeprazole 20 MG capsule Commonly known as: PRILOSEC TAKE (1) CAPSULE BY MOUTH ONCE DAILY. What changed: See the new instructions.   Oxycodone HCl 10 MG Tabs Take 1 tablet (10 mg total) by mouth every 6 (six) hours as needed for moderate pain or severe pain.   Senna Plus 8.6-50 MG tablet Generic drug: senna-docusate TAKE ONE TABLET BY MOUTH AT BEDTIME. What changed:   when to take this  reasons to take this   SENSIPAR PO Take 30 mg by mouth every dialysis.   silver sulfADIAZINE 1 % cream Commonly known as: SILVADENE Apply 1 application topically daily. Apply to right first toe What changed: See the new instructions.   torsemide 20 MG tablet Commonly known as: DEMADEX TAKE (2) TABLETS BY MOUTH TWICE DAILY. What changed: See the new instructions.   triamcinolone cream 0.1 % Commonly known as: KENALOG APPLY TO AFFECTED AREA TWICE DAILY What changed: See the new instructions.   valACYclovir 500 MG tablet Commonly known as: VALTREX TAKE (1) TABLET BY MOUTH EVERY OTHER DAY. What changed: See the new instructions.   Victoza 18 MG/3ML Sopn Generic drug:  liraglutide Start with 0.44m daily and increase by 0.664mper week until max dose of 1.8 mg dose achieved. What changed:   how much to take  how to take this  when to take this  additional instructions   Vitamin D (Ergocalciferol) 1.25 MG (50000 UNIT) Caps capsule Commonly known as: DRISDOL  TAKE 1 CAPSULE BY MOUTH ONCE A MONTH What changed: See the new instructions.        DISCHARGE INSTRUCTIONS:   Follow-up with dialysis as scheduled Follow-up PCP 5 days Follow-up orthopedic surgery 2 weeks  If you experience worsening of your admission symptoms, develop shortness of breath, life threatening emergency, suicidal or homicidal thoughts you must seek medical attention immediately by calling 911 or calling your MD immediately  if symptoms less severe.  You Must read complete instructions/literature along with all the possible adverse reactions/side effects for all the Medicines you take and that have been prescribed to you. Take any new Medicines after you have completely understood and accept all the possible adverse reactions/side effects.   Please note  You were cared for by a hospitalist during your hospital stay. If you have any questions about your discharge medications or the care you received while you were in the hospital after you are discharged, you can call the unit and asked to speak with the hospitalist on call if the hospitalist that took care of you is not available. Once you are discharged, your primary care physician will handle any further medical issues. Please note that NO REFILLS for any discharge medications will be authorized once you are discharged, as it is imperative that you return to your primary care physician (or establish a relationship with a primary care physician if you do not have one) for your aftercare needs so that they can reassess your need for medications and monitor your lab values.    Today   CHIEF COMPLAINT:   Chief Complaint  Patient  presents with  . Leg Pain  . Weakness    HISTORY OF PRESENT ILLNESS:  [name]  is a 64 y.o. female came in with knee pain and weakness   VITAL SIGNS:  Blood pressure 117/79, pulse 81, temperature 98.2 F (36.8 C), temperature source Oral, resp. rate 16, height _0  (1.727 m), weight (!) 137.4 kg, SpO2 97 %.  I/O:    Intake/Output Summary (Last 24 hours) at 02/01/2020 1724 Last data filed at 02/01/2020 1100 Gross per 24 hour  Intake 720 ml  Output 0 ml  Net 720 ml    PHYSICAL EXAMINATION:  GENERAL:  64 y.o.-year-old patient lying in the bed with no acute distress.  EYES: Pupils equal, round, reactive to light and accommodation. No scleral icterus.  HEENT: Head atraumatic, normocephalic. Oropharynx and nasopharynx clear.  LUNGS: Normal breath sounds bilaterally, no wheezing, rales,rhonchi or crepitation. No use of accessory muscles of respiration.  CARDIOVASCULAR: S1, S2 normal. No murmurs, rubs, or gallops.  ABDOMEN: Soft, non-tender.  EXTREMITIES: Trace pedal edema.  NEUROLOGIC: Cranial nerves II through XII are intact.  PSYCHIATRIC: The patient is alert and oriented x 3.  SKIN: No obvious rash, lesion, or ulcer.   DATA REVIEW:   CBC Recent Labs  Lab 01/31/20 1858  WBC 7.0  HGB 9.3*  HCT 27.7*  PLT 150    Chemistries  Recent Labs  Lab 01/31/20 0148 01/31/20 0148 01/31/20 1858  NA 135   < > 132*  K 5.3*   < > 4.2  CL 97*   < > 93*  CO2 22   < > 25  GLUCOSE 123*   < > 328*  BUN 106*   < > 59*  CREATININE 12.87*   < > 7.88*  CALCIUM 8.4*   < > 8.0*  MG 2.2  --   --   AST 66*  --   --  ALT 86*  --   --   ALKPHOS 324*  --   --   BILITOT 0.8  --   --    < > = values in this interval not displayed.     Microbiology Results  Results for orders placed or performed during the hospital encounter of 01/30/20  SARS Coronavirus 2 by RT PCR (hospital order, performed in [facility] hospital lab) Nasopharyngeal Nasopharyngeal Swab     Status: None    Collection Time: 01/30/20  8:14 PM   Specimen: Nasopharyngeal Swab  Result Value Ref Range Status   SARS Coronavirus 2 NEGATIVE NEGATIVE Final    Comment: (NOTE) SARS-CoV-2 target nucleic acids are NOT DETECTED.  The SARS-CoV-2 RNA is generally detectable in upper and lower respiratory specimens during the acute phase of infection. The lowest concentration of SARS-CoV-2 viral copies this assay can detect is 250 copies / mL. A negative result does not preclude SARS-CoV-2 infection and should not be used as the sole basis for treatment or other patient management decisions.  A negative result may occur with improper specimen collection / handling, submission of specimen other than nasopharyngeal swab, presence of viral mutation(s) within the areas targeted by this assay, and inadequate number of viral copies (<250 copies / mL). A negative result must be combined with clinical observations, patient history, and epidemiological information.  Fact Sheet for Patients:   StrictlyIdeas.no  Fact Sheet for Healthcare Providers: BankingDealers.co.za  This test is not yet approved or  cleared by the [place] FDA and has been authorized for detection and/or diagnosis of SARS-CoV-2 by FDA under an Emergency Use Authorization (EUA).  This EUA will remain in effect (meaning this test can be used) for the duration of the COVID-19 declaration under Section 564(b)(1) of the Act, 21 U.S.C. section 360bbb-3(b)(1), unless the authorization is terminated or revoked sooner.  Performed at [facility], [address] [place], [place] [address]   MRSA PCR Screening     Status: None   Collection Time: 01/31/20  5:30 AM   Specimen: Nasal Mucosa; Nasopharyngeal  Result Value Ref Range Status   MRSA by PCR NEGATIVE NEGATIVE Final    Comment:        The GeneXpert MRSA Assay (FDA approved for NASAL specimens only), is one component of  a comprehensive MRSA colonization surveillance program. It is not intended to diagnose MRSA infection nor to guide or monitor treatment for MRSA infections. Performed at [facility] [address] [place], [place] [address]    *Note: Due to a large number of results and/or encounters for the requested time period, some results have not been displayed. A complete set of results can be found in Results Review.    RADIOLOGY:  DG Knee 1-2 Views Left  Result Date: 01/31/2020 CLINICAL DATA:  Bilateral knee pain EXAM: LEFT KNEE - 1-2 VIEW COMPARISON:  None. FINDINGS: No fracture or malalignment. Moderate medial, mild lateral and mild patellofemoral degenerative changes. Trace knee effusion. Generalized subcutaneous edema. IMPRESSION: Tricompartment arthritis with trace knee effusion. Electronically Signed   By: Donavan Foil M.D.   On: 01/31/2020 16:14   DG Knee 1-2 Views Right  Result Date: 01/31/2020 CLINICAL DATA:  Bilateral knee pain EXAM: RIGHT KNEE - 1-2 VIEW COMPARISON:  10/31/2019 FINDINGS: No fracture or malalignment. Mild patellofemoral and moderate medial joint space degenerative change. Mild degenerative change of the lateral joint space with spurring. Trace knee effusion. Generalized subcutaneous edema. IMPRESSION: Tricompartment arthritis, most advanced involving the  medial compartment. Trace knee effusion. Electronically Signed   By: Donavan Foil M.D.   On: 01/31/2020 16:13   US Venous Img Lower Bilateral  Result Date: 01/30/2020 CLINICAL DATA:  Initial evaluation for acute swelling, pain. EXAM: BILATERAL LOWER EXTREMITY VENOUS DOPPLER ULTRASOUND TECHNIQUE: Gray-scale sonography with graded compression, as well as color Doppler and duplex ultrasound were performed to evaluate the lower extremity deep venous systems from the level of the common femoral vein and including the common femoral, femoral, profunda femoral, popliteal and calf veins including the posterior  tibial, peroneal and gastrocnemius veins when visible. The superficial great saphenous vein was also interrogated. Spectral Doppler was utilized to evaluate flow at rest and with distal augmentation maneuvers in the common femoral, femoral and popliteal veins. COMPARISON:  None. FINDINGS: RIGHT LOWER EXTREMITY Common Femoral Vein: No evidence of thrombus. Normal compressibility, respiratory phasicity and response to augmentation. Saphenofemoral Junction: No evidence of thrombus. Normal compressibility and flow on color Doppler imaging. Profunda Femoral Vein: No evidence of thrombus. Normal compressibility and flow on color Doppler imaging. Femoral Vein: No evidence of thrombus. Normal compressibility, respiratory phasicity and response to augmentation. Popliteal Vein: No evidence of thrombus. Normal compressibility, respiratory phasicity and response to augmentation. Calf Veins: No evidence of thrombus. Normal compressibility and flow on color Doppler imaging. Peroneal vein not visualized. Superficial Great Saphenous Vein: No evidence of thrombus. Normal compressibility. Venous Reflux:  None. Other Findings:  None. LEFT LOWER EXTREMITY Common Femoral Vein: No evidence of thrombus. Normal compressibility, respiratory phasicity and response to augmentation. Saphenofemoral Junction: No evidence of thrombus. Normal compressibility and flow on color Doppler imaging. Profunda Femoral Vein: No evidence of thrombus. Normal compressibility and flow on color Doppler imaging. Femoral Vein: No evidence of thrombus. Normal compressibility, respiratory phasicity and response to augmentation. Popliteal Vein: No evidence of thrombus. Normal compressibility, respiratory phasicity and response to augmentation. Calf Veins: No evidence of thrombus. Normal compressibility and flow on color Doppler imaging. Peroneal vein not visualized. Superficial Great Saphenous Vein: No evidence of thrombus. Normal compressibility. Venous Reflux:   None. Other Findings:  None. IMPRESSION: No evidence of deep venous thrombosis in either lower extremity. Electronically Signed   By: Jeannine Boga M.D.   On: 01/30/2020 18:28   DG Chest Port 1 View  Result Date: 01/30/2020 CLINICAL DATA:  Lower extremity pain and swelling EXAM: PORTABLE CHEST 1 VIEW COMPARISON:  10/31/2019 FINDINGS: The heart size and mediastinal contours are within normal limits. Both lungs are clear. The visualized skeletal structures are unremarkable. IMPRESSION: No active disease. Electronically Signed   By: Donavan Foil M.D.   On: 01/30/2020 22:45    Management plans discussed with the patient, and she is in agreement.  CODE STATUS:     Code Status Orders  (From admission, onward)         Start     Ordered   01/30/20 2307  Full code  Continuous        01/30/20 2306        Code Status History    Date Active Date Inactive Code Status Order ID Comments User Context   01/16/2020 0029 01/17/2020 0944 Full Code [id number]  Orene Desanctis, DO ED   10/31/2019 1611 11/01/2019 1908 Full Code [id number]  Mosetta Anis, MD ED   09/19/2019 1423 09/19/2019 2119 Full Code [id number]  Delana Meyer, Dolores Lory, MD Inpatient   12/28/2018 2041 01/02/2019 2150 Full Code [id number]  Mayer Camel, NP ED   05/10/2018 0121 05/14/2018 1902 Full  Code [id number]  Amelia Jo, MD ED   03/08/2018 [id number] 03/10/2018 0312 Full Code [id number]  Delana Meyer, Dolores Lory, MD Inpatient   12/25/2017 0059 12/28/2017 1721 Full Code [id number]  Toy Baker, MD ED   10/03/2017 1803 10/11/2017 1625 Full Code [id number]  Epifanio Lesches, MD ED   09/14/2017 1310 09/20/2017 1434 Full Code [id number]  Max Sane, MD ED   08/15/2017 2306 08/19/2017 2039 Full Code [id number]  Robert Bellow, MD Inpatient   08/11/2017 1350 08/15/2017 2306 Full Code [id number]  Demetrios Loll, MD Inpatient   06/02/2017 0234 06/06/2017 1536 Full Code [id number]  Caren Griffins, MD Inpatient   04/28/2017 0516 05/02/2017 1624 Full Code [id number]   Rise Patience, MD ED   03/17/2017 1222 03/19/2017 2030 Full Code [id number]  Liliane Shi, PA-C ED   12/13/2016 0345 12/14/2016 1647 Full Code [id number]  Reubin Milan, MD Inpatient   09/13/2016 1149 09/19/2016 1458 Full Code [id number]  Flossie Dibble, MD ED   09/04/2016 0137 09/05/2016 1801 Full Code [id number]  Etta Quill, DO ED   08/21/2016 1846 08/22/2016 1733 Full Code [id number]  Thurnell Lose, MD Inpatient   03/18/2015 0939 03/19/2015 1802 Full Code [id number]  Dellia Nims, MD Inpatient   02/15/2015 1700 02/18/2015 1817 Full Code [id number]  Francesca Oman, DO Inpatient   10/02/2014 1524 10/04/2014 1649 Full Code [id number]  Samella Parr, NP Inpatient   03/25/2012 0531 04/03/2012 1931 Full Code [id number]  Maudry Diego, RN Inpatient   Advance Care Planning Activity      TOTAL TIME TAKING CARE OF THIS PATIENT: 35 minutes.    Loletha Grayer M.D on 02/01/2020 at 5:24 PM  Between 7am to 6pm - Pager - [phone]  After 6pm go to [email or web address] - password [email or web address]  [facility]  CC: Primary care physician; [name], PA-C

## 2020-02-01 NOTE — TOC Initial Note (Addendum)
Transition of Care [facility]) - Initial/Assessment Note    Patient Details  Name: [name] MRN: [id number] Date of Birth: 14-Mar-1956  Transition of Care [facility]) CM/SW Contact:    Candie Chroman, LCSW Phone Number: 02/01/2020, 3:19 PM  Clinical Narrative: Readmission prevention screen complete. CSW met with patient. No supports at bedside. CSW introduced role and explained that discharge planning would be discussed. Patient's PCP is [name], PA-C at [facility]. Patient drives herself to appts and HD. Pharmacy is [facility] on the corner of [address] in [place]. No issues obtaining medications. Patient did not have home health services prior to admission. She is agreeable to PT recommendations for HHPT as well as a nurse. Provided CMS scores for agencies that serve her zip code. [facility] is first preference, [facility] is second. Left message for [facility] representative to see if they could take her. Patient has a rolling walker at home. No further concerns. CSW encouraged patient to contact CSW as needed. CSW will continue to follow patient for support and facilitate return home when stable.   3:44 pm: [facility] is able to accept patient for PT and RN. Asked MD to add RN to home health order.                Expected Discharge Plan: [place] Barriers to Discharge: Continued Medical Work up   Patient Goals and CMS Choice   CMS Medicare.gov Compare Post Acute Care list provided to:: Patient    Expected Discharge Plan and Services Expected Discharge Plan: [place] Choice: [place] arrangements for the past 2 months: Single Family Home                                      Prior Living Arrangements/Services Living arrangements for the past 2 months: Single Family Home Lives with:: Adult Children Patient language and need for interpreter reviewed:: Yes Do you feel safe going back to the  place where you live?: Yes      Need for Family Participation in Patient Care: Yes (Comment) Care giver support system in place?: Yes (comment) Current home services: DME Criminal Activity/Legal Involvement Pertinent to Current Situation/Hospitalization: No - Comment as needed  Activities of Daily Living Home Assistive Devices/Equipment: Gilford Rile (specify type), Grab bars in shower ADL Screening (condition at time of admission) Patient's cognitive ability adequate to safely complete daily activities?: Yes Is the patient deaf or have difficulty hearing?: No Does the patient have difficulty seeing, even when wearing glasses/contacts?: No Does the patient have difficulty concentrating, remembering, or making decisions?: No Patient able to express need for assistance with ADLs?: Yes Does the patient have difficulty dressing or bathing?: No Independently performs ADLs?: Yes (appropriate for developmental age) Does the patient have difficulty walking or climbing stairs?: Yes Weakness of Legs: Both Weakness of Arms/Hands: None  Permission Sought/Granted Permission sought to share information with : Facility Art therapist granted to share information with : Yes, Verbal Permission Granted     Permission granted to share info w AGENCY: Home Health Agencies        Emotional Assessment Appearance:: Appears stated age Attitude/Demeanor/Rapport: Engaged, Gracious Affect (typically observed): Accepting, Appropriate, Calm, Pleasant Orientation: : Oriented to Self, Oriented to Place, Oriented to  Time, Oriented to Situation Alcohol / Substance Use: Not Applicable Psych Involvement: No (comment)  Admission diagnosis:  Weakness [R53.1] Dyspnea [R06.00] Hyperglycemia [R73.9] Fluid overload [E87.70] Patient Active Problem List   Diagnosis Date Noted  . Fluid overload 01/30/2020  . Cellulitis 01/16/2020  . Anemia due to end stage renal disease ([place]) 01/16/2020  . Diabetic  polyneuropathy associated with type 2 diabetes mellitus ([place]) 11/06/2019  . Hemodialysis status ([place]) 11/06/2019  . Nystagmus 11/06/2019  . Intermittent claudication ([place]) 11/06/2019  . Vertigo of central origin 11/06/2019  . Weakness   . Hypervolemia associated with renal insufficiency 10/31/2019  . Ascending aortic aneurysm ([place]) 03/27/2019  . Puncture wound without foreign body of left forearm, initial encounter 01/02/2019  . Non-healing wound of upper extremity 12/28/2018  . Unspecified open wound of left upper arm, initial encounter 12/06/2018  . Complication from renal dialysis device 10/20/2018  . Iron deficiency anemia, unspecified 10/18/2018  . ESRD (end stage renal disease) on dialysis ([place]) 06/01/2018  . ARF (acute renal failure) ([place]) 05/09/2018  . Colon cancer screening   . LGSIL on Pap smear of cervix 01/27/2018  . Gout 12/25/2017  . AKI (acute kidney injury) ([employer]) 12/24/2017  . Hyperglycemia 12/24/2017  . Chronic diastolic heart failure ([place]) 09/30/2017  . Lymphedema 09/30/2017  . Aortic stenosis   . Acute pain of both knees 02/12/2017  . Chronic gout due to renal impairment of multiple sites without tophus 02/12/2017  . Esophageal dysphagia 02/12/2017  . Osteoarthritis 01/22/2017  . Diabetic hyperosmolar non-ketotic state ([place]) 12/13/2016  . CAD (coronary artery disease) 12/13/2016  . Hyperlipidemia 12/13/2016  . Hyperphosphatemia 12/13/2016  . Acute diastolic CHF (congestive heart failure) ([place])   . Asthma 11/29/2015  . Depression 09/05/2015  . GERD (gastroesophageal reflux disease) 03/25/2015  . Environmental allergies 03/14/2015  . SOB (shortness of breath) 02/15/2015  . Chronic hepatitis C without hepatic coma ([place]) 01/31/2015  . Diabetic neuropathy ([place]) 01/31/2015  . OSA (obstructive sleep apnea) 01/01/2015  . Poor dentition 11/13/2014  . Essential hypertension 10/08/2014  . Morbid (severe) obesity due to excess calories ([place]) 10/08/2014  . Type 2  diabetes mellitus with ESRD (end-stage renal disease) ([place]) 03/25/2012    Class: Chronic   PCP:  [name], PA-C Pharmacy:   [facility] [phone] [place], [place] [address] [place] [address] Phone: [phone] Fax: [place], [place] - [address] [place] [address] Phone: [phone] Fax: [phone]  [id number] [place] [address]: [phone]: [phone]    Social Determinants of Health ([place] Interventions    Readmission Risk Interventions Readmission Risk Prevention Plan 02/01/2020 12/30/2018  Transportation Screening Complete Complete  HRI or Home Care Consult - Complete  Palliative Care Screening - Not Applicable  Medication Review ([place] Complete Complete  PCP or Specialist appointment within 3-5 days of discharge Complete -  [place] Home Care Consult Complete -  SW Recovery Care/Counseling Consult Complete -  Palliative Care Screening Not Applicable -  [place]ana[place] Applicable -  Some recent data might be hidden

## 2020-02-01 NOTE — Progress Notes (Signed)
Discharge order received. Patient mental status is at baseline. Vital signs stable . No signs of acute distress. Discharge instructions given. Patient verbalized understanding. No other issues noted at this time. Awaiting pt. transport to go home.

## 2020-02-01 NOTE — Progress Notes (Signed)
[facility]  ROUNDING NOTE   Subjective:   Seen and examined on hemodialysis treatment.     HEMODIALYSIS FLOWSHEET:  Blood Flow Rate (mL/min): 400 mL/min Arterial Pressure (mmHg): -170 mmHg Venous Pressure (mmHg): 170 mmHg Transmembrane Pressure (mmHg): 60 mmHg Ultrafiltration Rate (mL/min): 830 mL/min Dialysate Flow Rate (mL/min): 600 ml/min Conductivity: Machine : 14 Conductivity: Machine : 14 Dialysis Fluid Bolus: Normal Saline Bolus Amount (mL): 200 mL    Worked with physical therapy this morning.   Objective:  Vital signs in last 24 hours:  Temp:  [97.9 F (36.6 C)-98.7 F (37.1 C)] 98.5 F (36.9 C) (07/22 1030) Pulse Rate:  [75-89] 75 (07/22 1400) Resp:  [20] 20 (07/22 1045) BP: (111-152)/(50-89) 134/66 (07/22 1400) SpO2:  [94 %-99 %] 99 % (07/22 1030)  Weight change:  Filed Weights   01/30/20 1307 01/31/20 0415  Weight: 131.5 kg (!) 137.4 kg    Intake/Output: I/O last 3 completed shifts: In: 60 [P.O.:720] Out: 3000 [Other:3000]   Intake/Output this shift:  Total I/O In: 120 [P.O.:120] Out: -   Physical Exam: General: NAD,   Head: Normocephalic, atraumatic. Moist oral mucosal membranes  Eyes: Anicteric, PERRL  Neck: Supple, trachea midline  Lungs:  Clear to auscultation  Heart: Regular rate and rhythm  Abdomen:  Soft, nontender,   Extremities:  no peripheral edema.  Neurologic: Nonfocal, moving all four extremities, 5/5 strength in all four extremities.   Skin: No lesions  Access: Left AVF    Basic Metabolic Panel: Recent Labs  Lab 01/30/20 1335 01/30/20 1335 01/30/20 2349 01/31/20 0148 01/31/20 1858  NA 131*  --  136 135 132*  K 5.1  --  4.8 5.3* 4.2  CL 94*  --  98 97* 93*  CO2 19*  --  22 22 25   GLUCOSE 449*  --  84 123* 328*  BUN 111*  --  111* 106* 59*  CREATININE 12.29*  --  12.91* 12.87* 7.88*  CALCIUM 8.4*   < > 8.3* 8.4* 8.0*  MG  --   --   --  2.2  --   PHOS  --   --  9.3* 9.0* 5.1*   < > = values in  this interval not displayed.    Liver Function Tests: Recent Labs  Lab 01/30/20 2349 01/31/20 0148 01/31/20 1858  AST  --  66*  --   ALT  --  86*  --   ALKPHOS  --  324*  --   BILITOT  --  0.8  --   PROT  --  7.6  --   ALBUMIN 3.4* 3.3* 3.2*   No results for input(s): LIPASE, AMYLASE in the last 168 hours. No results for input(s): AMMONIA in the last 168 hours.  CBC: Recent Labs  Lab 01/30/20 1335 01/30/20 2349 01/31/20 0148 01/31/20 1858  WBC 7.2 7.2 7.6 7.0  NEUTROABS  --   --  5.2  --   HGB 9.3* 9.6* 9.8* 9.3*  HCT 27.9* 29.5* 29.2* 27.7*  MCV 94.9 95.2 92.7 93.3  PLT 145* 145* 158 150    Cardiac Enzymes: No results for input(s): CKTOTAL, CKMB, CKMBINDEX, TROPONINI in the last 168 hours.  BNP: Invalid input(s): POCBNP  CBG: Recent Labs  Lab 01/31/20 2029 01/31/20 2108 02/01/20 0040 02/01/20 0441 02/01/20 0811  GLUCAP 422* 404* 490* 478* 391*    Microbiology: Results for orders placed or performed during the hospital encounter of 01/30/20  SARS Coronavirus 2 by RT PCR (hospital order,  performed in [facility] hospital lab) Nasopharyngeal Nasopharyngeal Swab     Status: None   Collection Time: 01/30/20  8:14 PM   Specimen: Nasopharyngeal Swab  Result Value Ref Range Status   SARS Coronavirus 2 NEGATIVE NEGATIVE Final    Comment: (NOTE) SARS-CoV-2 target nucleic acids are NOT DETECTED.  The SARS-CoV-2 RNA is generally detectable in upper and lower respiratory specimens during the acute phase of infection. The lowest concentration of SARS-CoV-2 viral copies this assay can detect is 250 copies / mL. A negative result does not preclude SARS-CoV-2 infection and should not be used as the sole basis for treatment or other patient management decisions.  A negative result may occur with improper specimen collection / handling, submission of specimen other than nasopharyngeal swab, presence of viral mutation(s) within the areas targeted by this assay, and  inadequate number of viral copies (<250 copies / mL). A negative result must be combined with clinical observations, patient history, and epidemiological information.  Fact Sheet for Patients:   StrictlyIdeas.no  Fact Sheet for Healthcare Providers: BankingDealers.co.za  This test is not yet approved or  cleared by the [place] FDA and has been authorized for detection and/or diagnosis of SARS-CoV-2 by FDA under an Emergency Use Authorization (EUA).  This EUA will remain in effect (meaning this test can be used) for the duration of the COVID-19 declaration under Section 564(b)(1) of the Act, 21 U.S.C. section 360bbb-3(b)(1), unless the authorization is terminated or revoked sooner.  Performed at [facility], [address] [place], [place] [address]   MRSA PCR Screening     Status: None   Collection Time: 01/31/20  5:30 AM   Specimen: Nasal Mucosa; Nasopharyngeal  Result Value Ref Range Status   MRSA by PCR NEGATIVE NEGATIVE Final    Comment:        The GeneXpert MRSA Assay (FDA approved for NASAL specimens only), is one component of a comprehensive MRSA colonization surveillance program. It is not intended to diagnose MRSA infection nor to guide or monitor treatment for MRSA infections. Performed at [facility], [address] [place], [place] [address]    *Note: Due to a large number of results and/or encounters for the requested time period, some results have not been displayed. A complete set of results can be found in Results Review.    Coagulation Studies: No results for input(s): LABPROT, INR in the last 72 hours.  Urinalysis: No results for input(s): COLORURINE, LABSPEC, PHURINE, GLUCOSEU, HGBUR, BILIRUBINUR, KETONESUR, PROTEINUR, UROBILINOGEN, NITRITE, LEUKOCYTESUR in the last 72 hours.  Invalid input(s): APPERANCEUR    Imaging: DG Knee 1-2 Views Left  Result Date:  01/31/2020 CLINICAL DATA:  Bilateral knee pain EXAM: LEFT KNEE - 1-2 VIEW COMPARISON:  None. FINDINGS: No fracture or malalignment. Moderate medial, mild lateral and mild patellofemoral degenerative changes. Trace knee effusion. Generalized subcutaneous edema. IMPRESSION: Tricompartment arthritis with trace knee effusion. Electronically Signed   By: Donavan Foil M.D.   On: 01/31/2020 16:14   DG Knee 1-2 Views Right  Result Date: 01/31/2020 CLINICAL DATA:  Bilateral knee pain EXAM: RIGHT KNEE - 1-2 VIEW COMPARISON:  10/31/2019 FINDINGS: No fracture or malalignment. Mild patellofemoral and moderate medial joint space degenerative change. Mild degenerative change of the lateral joint space with spurring. Trace knee effusion. Generalized subcutaneous edema. IMPRESSION: Tricompartment arthritis, most advanced involving the medial compartment. Trace knee effusion. Electronically Signed   By: Donavan Foil M.D.   On: 01/31/2020 16:13   US Venous Img  Lower Bilateral  Result Date: 01/30/2020 CLINICAL DATA:  Initial evaluation for acute swelling, pain. EXAM: BILATERAL LOWER EXTREMITY VENOUS DOPPLER ULTRASOUND TECHNIQUE: Gray-scale sonography with graded compression, as well as color Doppler and duplex ultrasound were performed to evaluate the lower extremity deep venous systems from the level of the common femoral vein and including the common femoral, femoral, profunda femoral, popliteal and calf veins including the posterior tibial, peroneal and gastrocnemius veins when visible. The superficial great saphenous vein was also interrogated. Spectral Doppler was utilized to evaluate flow at rest and with distal augmentation maneuvers in the common femoral, femoral and popliteal veins. COMPARISON:  None. FINDINGS: RIGHT LOWER EXTREMITY Common Femoral Vein: No evidence of thrombus. Normal compressibility, respiratory phasicity and response to augmentation. Saphenofemoral Junction: No evidence of thrombus. Normal  compressibility and flow on color Doppler imaging. Profunda Femoral Vein: No evidence of thrombus. Normal compressibility and flow on color Doppler imaging. Femoral Vein: No evidence of thrombus. Normal compressibility, respiratory phasicity and response to augmentation. Popliteal Vein: No evidence of thrombus. Normal compressibility, respiratory phasicity and response to augmentation. Calf Veins: No evidence of thrombus. Normal compressibility and flow on color Doppler imaging. Peroneal vein not visualized. Superficial Great Saphenous Vein: No evidence of thrombus. Normal compressibility. Venous Reflux:  None. Other Findings:  None. LEFT LOWER EXTREMITY Common Femoral Vein: No evidence of thrombus. Normal compressibility, respiratory phasicity and response to augmentation. Saphenofemoral Junction: No evidence of thrombus. Normal compressibility and flow on color Doppler imaging. Profunda Femoral Vein: No evidence of thrombus. Normal compressibility and flow on color Doppler imaging. Femoral Vein: No evidence of thrombus. Normal compressibility, respiratory phasicity and response to augmentation. Popliteal Vein: No evidence of thrombus. Normal compressibility, respiratory phasicity and response to augmentation. Calf Veins: No evidence of thrombus. Normal compressibility and flow on color Doppler imaging. Peroneal vein not visualized. Superficial Great Saphenous Vein: No evidence of thrombus. Normal compressibility. Venous Reflux:  None. Other Findings:  None. IMPRESSION: No evidence of deep venous thrombosis in either lower extremity. Electronically Signed   By: Jeannine Boga M.D.   On: 01/30/2020 18:28   DG Chest Port 1 View  Result Date: 01/30/2020 CLINICAL DATA:  Lower extremity pain and swelling EXAM: PORTABLE CHEST 1 VIEW COMPARISON:  10/31/2019 FINDINGS: The heart size and mediastinal contours are within normal limits. Both lungs are clear. The visualized skeletal structures are unremarkable.  IMPRESSION: No active disease. Electronically Signed   By: Donavan Foil M.D.   On: 01/30/2020 22:45     Medications:    . allopurinol  100 mg Oral Daily  . aspirin EC  81 mg Oral Daily  . atorvastatin  80 mg Oral q1800  . carvedilol  25 mg Oral BID WC  . dicyclomine  20 mg Oral TID AC & HS  . docusate sodium  100 mg Oral BID  . epoetin (EPOGEN/PROCRIT) injection  10,000 Units Intravenous Q T,Th,Sa-HD  . feeding supplement (NEPRO CARB STEADY)  237 mL Oral Q24H  . ferric citrate  420 mg Oral TID WC  . hydrALAZINE  100 mg Oral TID  . insulin aspart  0-5 Units Subcutaneous QHS  . insulin aspart  0-6 Units Subcutaneous TID WC  . insulin aspart  12 Units Subcutaneous TID WC  . insulin detemir  20 Units Subcutaneous Daily  . insulin detemir  60 Units Subcutaneous QHS  . loratadine  10 mg Oral QHS  . mometasone-formoterol  2 puff Inhalation BID  . montelukast  10 mg Oral QHS  .  multivitamin  1 tablet Oral QHS  . pantoprazole  40 mg Oral Daily  . predniSONE  20 mg Oral Q breakfast  . valACYclovir  500 mg Oral QODAY   acetaminophen **OR** acetaminophen, albuterol, ondansetron **OR** ondansetron (ZOFRAN) IV, oxyCODONE  Assessment/ Plan:  [name] is a 64 y.o. black female with end stage renal disease on hemodialysis, hypertension, gout, diabetes mellitus type II, coronary artery disease, hepatitis C, peptic ulcer disease, congestive heart failure, AAA who is admitted to [facility] on 01/30/2020 for Weakness [R53.1] Dyspnea [R06.00] Hyperglycemia [R73.9] Fluid overload [E87.70]  Portal Kidney TTS [facility] Left AVF 129.5kg.   1. End Stage Renal Disease:  Seen and examined on hemodialysis treatment.   2. Hypertension: well controlled on hemodialysis treatment. Continue hydralazine and carvedilol.   3. Weakness: patient's dose of pregabalin is too high for her renal function.  - Discontinue for now. Appreciate pharmacy input.  - prednisone  4. Anemia of  chronic kidney disease: hemoglobin 9.8. Mircera as an outpatient. Start ESA with TTS treatments.   5. Secondary Hyperparathyroidism: with hyperphosphatemia.  Lorin Picket with meals.    LOS: 1 Litisha Guagliardo 7/22/20212:36 PM

## 2020-02-01 NOTE — Consult Note (Signed)
ORTHOPAEDIC CONSULTATION  REQUESTING PHYSICIAN: Loletha Grayer, MD  Chief Complaint: bilateral knee pain  HPI: [name] is a 64 y.o. female who complains of left knee pain.Patient came in with leg pain and weakness and found to be in fluid overload.  Patient does have some shortness of breath.  Patient seen today while on dialysis.  She states that her leg pain is more acute on her left leg and also having numbness and tingling.  Patient has some chronic pain in her right leg.  When she stands up she feels like her right knee gives out on her.  She then falls backwards. Please see H&P and ED notes for details. Denies any numbness, tingling or constitutional symptoms.  Past Medical History:  Diagnosis Date   Anemia    Aortic stenosis    Echo 8/18: mean 13, peak 28, LVOT/AV mean velocity 0.51   Arthritis    Asthma    As a child    Bronchitis    CAD (coronary artery disease)    a. 09/2016: 50% Ost 1st Mrg stenosis, 50% 2nd Mrg stenosis, 20% Mid-Cx, 95% Prox LAD, 40% mid-LAD, and 10% dist-LAD stenosis. Staged PCI with DES to Prox-LAD.    Chronic combined systolic and diastolic CHF (congestive heart failure) ([place]) 2011   echo 2/18: EF 55-60, normal wall motion, grade 2 diastolic dysfunction, trivial AI // echo 3/18: Septal and apical HK, EF 45-50, normal wall motion, trivial AI, mild LAE, PASP 38 // echo 8/18: EF 60-65, normal wall motion, grade 1 diastolic dysfunction, calcified aortic valve leaflets, mild aortic stenosis (mean 13, peak 28, LVOT/AV mean velocity 0.51), mild AI, moderate MAC, mild LAE, trivial TR    Chronic kidney disease    STAGE 4   Chronic kidney disease on chronic dialysis (HCC)    t, th, sat   Complication of anesthesia    Depression    Diabetes mellitus Dx 1989   Elevated lipids    GERD (gastroesophageal reflux disease)    Gout    Heart murmur    asymptomatic   Hepatitis C Dx 2013   Hypertension Dx 1989   Infected surgical wound    Lt  arm   Myocardial infarction ([place]) 07/2015   Obesity    Pancreatitis 2013   Pneumonia    Refusal of blood transfusions as patient is Jehovah's Witness    Tendinitis    Tremors of nervous system    LEFT HAND   Ulcer 2010   Past Surgical History:  Procedure Laterality Date   A/V FISTULAGRAM Left 04/11/2019   Procedure: A/V FISTULAGRAM;  Surgeon: Katha Cabal, MD;  Location: [place] [facility];  Service: Cardiovascular;  Laterality: Left;   A/V FISTULAGRAM Left 06/02/2019   Procedure: A/V FISTULAGRAM;  Surgeon: Katha Cabal, MD;  Location: [place] [facility];  Service: Cardiovascular;  Laterality: Left;   APPLICATION OF WOUND VAC Left 08/14/2017   Procedure: APPLICATION OF WOUND VAC Exchange;  Surgeon: Robert Bellow, MD;  Location: [facility];  Service: General;  Laterality: Left;   APPLICATION OF WOUND VAC Left 12/21/2018   Procedure: APPLICATION OF WOUND VAC;  Surgeon: Katha Cabal, MD;  Location: [facility];  Service: Vascular;  Laterality: Left;   AV FISTULA PLACEMENT Left 08/19/2018   Procedure: ARTERIOVENOUS (AV) FISTULA CREATION ( BRACHIOBASILIC );  Surgeon: Katha Cabal, MD;  Location: [facility];  Service: Vascular;  Laterality: Left;   BASCILIC VEIN TRANSPOSITION Left 11/18/2018   Procedure: BASCILIC  VEIN TRANSPOSITION;  Surgeon: Katha Cabal, MD;  Location: [facility];  Service: Vascular;  Laterality: Left;   CHOLECYSTECTOMY     COLONOSCOPY WITH PROPOFOL N/A 02/03/2018   Procedure: COLONOSCOPY WITH PROPOFOL;  Surgeon: Lin Landsman, MD;  Location: [facility];  Service: Gastroenterology;  Laterality: N/A;   CORONARY ANGIOPLASTY  07/2015   STENT   CORONARY STENT INTERVENTION N/A 09/18/2016   Procedure: Coronary Stent Intervention;  Surgeon: [place] Sine, MD;  Location: [place] [facility];  Service: Cardiovascular;  Laterality: N/A;   DIALYSIS/PERMA CATHETER INSERTION N/A 05/10/2018   Procedure: DIALYSIS/PERMA CATHETER INSERTION;   Surgeon: Katha Cabal, MD;  Location: [place] [facility];  Service: Cardiovascular;  Laterality: N/A;   DRESSING CHANGE UNDER ANESTHESIA Left 08/15/2017   Procedure: exploration of wound for bleeding;  Surgeon: Robert Bellow, MD;  Location: [facility];  Service: General;  Laterality: Left;   ESOPHAGOGASTRODUODENOSCOPY (EGD) WITH PROPOFOL N/A 02/03/2018   Procedure: ESOPHAGOGASTRODUODENOSCOPY (EGD) WITH PROPOFOL;  Surgeon: Lin Landsman, MD;  Location: [facility];  Service: Gastroenterology;  Laterality: N/A;   EYE SURGERY  11/17/2018   INCISION AND DRAINAGE ABSCESS Left 08/12/2017   Procedure: INCISION AND DRAINAGE ABSCESS;  Surgeon: Robert Bellow, MD;  Location: [facility];  Service: General;  Laterality: Left;   KNEE ARTHROSCOPY     LEFT HEART CATH N/A 09/18/2016   Procedure: Left Heart Cath;  Surgeon: [place] Sine, MD;  Location: [place] [facility];  Service: Cardiovascular;  Laterality: N/A;   LEFT HEART CATH AND CORONARY ANGIOGRAPHY N/A 09/16/2016   Procedure: Left Heart Cath and Coronary Angiography;  Surgeon: Burnell Blanks, MD;  Location: [place] [facility];  Service: Cardiovascular;  Laterality: N/A;   LEFT HEART CATH AND CORONARY ANGIOGRAPHY N/A 04/29/2017   Procedure: LEFT HEART CATH AND CORONARY ANGIOGRAPHY;  Surgeon: Nelva Bush, MD;  Location: [place] [facility];  Service: Cardiovascular;  Laterality: N/A;   LOWER EXTREMITY ANGIOGRAPHY Right 03/08/2018   Procedure: LOWER EXTREMITY ANGIOGRAPHY;  Surgeon: Katha Cabal, MD;  Location: [place] [facility];  Service: Cardiovascular;  Laterality: Right;   TUBAL LIGATION     TUBAL LIGATION     UPPER EXTREMITY ANGIOGRAPHY Right 09/19/2019   Procedure: UPPER EXTREMITY ANGIOGRAPHY;  Surgeon: Katha Cabal, MD;  Location: [place] [facility];  Service: Cardiovascular;  Laterality: Right;   WOUND DEBRIDEMENT Left 12/21/2018   Procedure: DEBRIDEMENT WOUND;  Surgeon: Katha Cabal, MD;   Location: [facility];  Service: Vascular;  Laterality: Left;   WOUND DEBRIDEMENT Left 12/30/2018   Procedure: DEBRIDEMENT WOUND WITH VAC PLACEMENT (LEFT UPPER EXTREMITY);  Surgeon: Katha Cabal, MD;  Location: [facility];  Service: Vascular;  Laterality: Left;   Social History   Socioeconomic History   Marital status: Divorced    Spouse name: Not on file   Number of children: 3   Years of education: Not on file   Highest education level: Bachelor's degree (e.g., BA, AB, BS)  Occupational History   Occupation: disability  Tobacco Use   Smoking status: Former Smoker    Packs/day: 0.25    Years: 6.00    Pack years: 1.50    Types: Cigarettes    Quit date: 10/25/1980    Years since quitting: 39.2   Smokeless tobacco: Never Used  Vaping Use   Vaping Use: Never used  Substance and Sexual Activity   Alcohol use: Not Currently   Drug use: Yes    Types:  Marijuana    Comment: Occasional marijuana use 08/21/2016 "no crack-clean since 05/1998"   Sexual activity: Not on file    Comment: Not asked  Other Topics Concern   Not on file  Social History Narrative   Not on file   Social Determinants of Health   Financial Resource Strain: Low Risk    Difficulty of Paying Living Expenses: Not hard at all  Food Insecurity: No Food Insecurity   Worried About Charity fundraiser in the Last Year: Never true   [place] in the Last Year: Never true  Transportation Needs: No Transportation Needs   Lack of Transportation (Medical): No   Lack of Transportation (Non-Medical): No  Physical Activity: Inactive   Days of Exercise per Week: 0 days   Minutes of Exercise per Session: 0 min  Stress: Stress Concern Present   Feeling of Stress : To some extent  Social Connections: Socially Isolated   Frequency of Communication with Friends and Family: Twice a week   Frequency of Social Gatherings with Friends and Family: Never   Attends Religious Services: 1 to 4 times per  year   Active Member of [employer] or Organizations: No   Attends Music therapist: Never   Marital Status: Divorced   Family History  Problem Relation Age of Onset   Colon cancer Mother    Heart attack Other    Heart attack Maternal Grandmother    Hypertension Sister    Hypertension Brother    Diabetes Paternal Grandmother    Breast cancer Neg Hx    Allergies  Allergen Reactions   Shellfish Allergy Anaphylaxis and Swelling   Diazepam Other (See Comments)    "felt like out of body experience"   Prior to Admission medications   Medication Sig Start Date End Date Taking? Authorizing Provider  albuterol (VENTOLIN HFA) 108 (90 Base) MCG/ACT inhaler Inhale 2 puffs into the lungs every 4 (four) hours as needed for wheezing or shortness of breath. 10/21/19 01/30/20 Yes Soto, Johana, PA-C  allopurinol (ZYLOPRIM) 100 MG tablet TAKE 1 TABLET BY MOUTH ONCE DAILY. 12/08/19  Yes [name], PA-C  aspirin EC 81 MG EC tablet Take 1 tablet (81 mg total) by mouth daily. 09/19/16  Yes Strader, [place] M, PA-C  atorvastatin (LIPITOR) 80 MG tablet TAKE 1 TABLET BY MOUTH ONCE DAILY AT 6PM Patient taking differently: Take 80 mg by mouth daily at 6 PM.  11/09/19  Yes Burnette, Clearnce Sorrel, PA-C  AURYXIA 1 GM 210 MG(Fe) tablet Take 420 mg by mouth 3 (three) times daily with meals.  10/10/18  Yes [provider]  b complex-vitamin c-folic acid (NEPHRO-VITE) 0.8 MG TABS tablet Take 1 tablet by mouth daily.    Yes [provider]  carvedilol (COREG) 25 MG tablet TAKE (1) TABLET BY MOUTH TWICE A DAY WITH MEALS (BREAKFAST AND SUPPER) Patient taking differently: Take 25 mg by mouth 2 (two) times daily with a meal.  11/09/19  Yes Burnette, Clearnce Sorrel, PA-C  cephALEXin (KEFLEX) 500 MG capsule Take 1 capsule (500 mg total) by mouth 2 (two) times daily. Patient taking differently: Take 500 mg by mouth 2 (two) times daily. For 7 days 01/25/20  Yes [name], PA-C   Cinacalcet HCl (SENSIPAR PO) Take 30 mg by mouth every dialysis. 01/04/20 01/02/21 Yes [provider]  cyclobenzaprine (FLEXERIL) 5 MG tablet Take 1 tablet (5 mg total) by mouth 3 (three) times daily as needed for muscle spasms. 08/10/19  Yes [name], PA-C  dicyclomine (BENTYL) 20 MG tablet TAKE 1 TABLET(20 MG) BY MOUTH FOUR TIMES DAILY BEFORE MEALS AND AT BEDTIME Patient taking differently: Take 20 mg by mouth 4 (four) times daily -  before meals and at bedtime.  10/16/19  Yes [name], PA-C  docusate sodium (COLACE) 100 MG capsule Take 1 capsule (100 mg total) by mouth 2 (two) times daily as needed for mild constipation. 09/20/17  Yes Gouru, Illene Silver, MD  doxepin (SINEQUAN) 10 MG capsule Take 1 capsule (10 mg total) by mouth 4 (four) times daily as needed. For itching 11/07/19  Yes [name], PA-C  DULERA 200-5 MCG/ACT AERO Inhale 2 puffs into the lungs 2 (two) times daily as needed.  11/26/19  Yes [provider]  fluticasone (FLONASE) 50 MCG/ACT nasal spray Place 2 sprays into both nostrils daily as needed for allergies or rhinitis. 09/28/17  Yes [name] M, PA-C  fluticasone furoate-vilanterol (BREO ELLIPTA) 200-25 MCG/INH AEPB Inhale 1 puff into the lungs daily. Patient taking differently: Inhale 1 puff into the lungs daily as needed.  11/07/19  Yes [name], PA-C  gentamicin cream (GARAMYCIN) 0.1 % Apply 1 application topically 2 (two) times daily. Patient taking differently: Apply 1 application topically 2 (two) times daily as needed.  11/20/19  Yes Edrick Kins, DPM  hydrALAZINE (APRESOLINE) 100 MG tablet TAKE (1) TABLET BY MOUTH THREE TIMES DAILY Patient taking differently: Take 100 mg by mouth 3 (three) times daily.  08/14/19  Yes [name], PA-C  hydrOXYzine (ATARAX/VISTARIL) 25 MG tablet Take 25 mg by mouth 4 (four) times daily. 01/06/20  Yes [provider]  LEVEMIR FLEXTOUCH 100 UNIT/ML FlexPen INJECT 60  UNITS UNDER THE SKIN TWICE DAILY Patient taking differently: Inject 60 Units into the skin 2 (two) times daily.  11/07/19  Yes [name], PA-C  lidocaine-prilocaine (EMLA) cream Apply 1 application topically as needed. 03/27/19  Yes Schnier, Dolores Lory, MD  liraglutide (VICTOZA) 18 MG/3ML SOPN Start with 0.61m daily and increase by 0.639mper week until max dose of 1.8 mg dose achieved. Patient taking differently: Inject 1.8 mg into the skin daily.  09/08/19  Yes BuMar DaringPA-C  LOPERAMIDE HCL PO Take 1 tablet by mouth as needed.  10/21/19 10/19/20 Yes [provider]  loratadine (CLARITIN) 10 MG tablet TAKE 1 TABLET BY MOUTH ONCE DAILY. Patient taking differently: Take 10 mg by mouth at bedtime.  08/11/19  Yes Burnette, Jennifer M, PA-C  mometasone (ELOCON) 0.1 % cream APPLY AS DIRECTED TO AFFECTED AREA ONCE DAILY. Patient taking differently: Apply 1 application topically daily as needed.  12/08/18  Yes Burnette, JeAnderson Malta, PA-C  montelukast (SINGULAIR) 10 MG tablet TAKE ONE TABLET BY MOUTH AT BEDTIME. Patient taking differently: Take 10 mg by mouth at bedtime.  08/11/19  Yes BuMar DaringPA-C  nitroGLYCERIN (NITROSTAT) 0.4 MG SL tablet Place 1 tablet (0.4 mg total) under the tongue every 5 (five) minutes x 3 doses as needed for chest pain. 02/03/19  Yes Bhagat, Bhavinkumar, PA  NOVOLOG FLEXPEN 100 UNIT/ML FlexPen USE 25 UNITS UNDER THE SKIN THREE TIMES DAILY WITH MEALS Patient taking differently: Inject 25 Units into the skin 3 (three) times daily with meals.  07/18/19  Yes BuMar DaringPA-C  nystatin (MYCOSTATIN) 100000 UNIT/ML suspension Take 5 mLs (500,000 Units total) by mouth 4 (four) times daily. 01/25/20  Yes BuFenton Malling, PA-C  nystatin cream (MYCOSTATIN) Apply 1 application  topically 2 (two) times daily. Patient taking differently: Apply 1 application topically daily as needed for dry skin.  08/10/19  Yes [name] M, PA-C  omeprazole  (PRILOSEC) 20 MG capsule TAKE (1) CAPSULE BY MOUTH ONCE DAILY. Patient taking differently: Take 20 mg by mouth every evening.  08/11/19  Yes [name], PA-C  pregabalin (LYRICA) 150 MG capsule Take 1 capsule (150 mg total) by mouth 2 (two) times daily. 08/10/19  Yes Burnette, Jennifer M, PA-C  SENNA PLUS 8.6-50 MG tablet TAKE ONE TABLET BY MOUTH AT BEDTIME. Patient taking differently: Take 1 tablet by mouth at bedtime as needed.  08/11/19  Yes [name], PA-C  silver sulfADIAZINE (SILVADENE) 1 % cream Apply 1 application topically daily. Apply to right first toe Patient taking differently: Apply 1 application topically daily as needed (pain).  02/21/19  Yes Kris Hartmann, NP  torsemide (DEMADEX) 20 MG tablet TAKE (2) TABLETS BY MOUTH TWICE DAILY. Patient taking differently: Take 40 mg by mouth 2 (two) times daily.  04/20/19  Yes [name], PA-C  traMADol (ULTRAM) 50 MG tablet Take 1 tablet (50 mg total) by mouth every 8 (eight) hours as needed. 01/25/20  Yes [name], PA-C  triamcinolone cream (KENALOG) 0.1 % APPLY TO AFFECTED AREA TWICE DAILY Patient taking differently: 2 (two) times daily as needed.  06/17/18  Yes Burnette, Anderson Malta M, PA-C  valACYclovir (VALTREX) 500 MG tablet TAKE (1) TABLET BY MOUTH EVERY OTHER DAY. Patient taking differently: Take 500 mg by mouth every other day.  10/06/19  Yes Burnette, Clearnce Sorrel, PA-C  Vitamin D, Ergocalciferol, (DRISDOL) 1.25 MG (50000 UT) CAPS capsule TAKE 1 CAPSULE BY MOUTH ONCE A MONTH Patient taking differently: Take 50,000 Units by mouth every 30 (thirty) days.  06/16/19  Yes [name], PA-C  gabapentin (NEURONTIN) 100 MG capsule Take 1 capsule (100 mg total) by mouth 3 (three) times daily. 08/19/17 02/03/19  [name], MD   DG Knee 1-2 Views Left  Result Date: 01/31/2020 CLINICAL DATA:  Bilateral knee pain EXAM: LEFT KNEE - 1-2 VIEW COMPARISON:  None. FINDINGS: No fracture or malalignment. Moderate  medial, mild lateral and mild patellofemoral degenerative changes. Trace knee effusion. Generalized subcutaneous edema. IMPRESSION: Tricompartment arthritis with trace knee effusion. Electronically Signed   By: Donavan Foil M.D.   On: 01/31/2020 16:14   DG Knee 1-2 Views Right  Result Date: 01/31/2020 CLINICAL DATA:  Bilateral knee pain EXAM: RIGHT KNEE - 1-2 VIEW COMPARISON:  10/31/2019 FINDINGS: No fracture or malalignment. Mild patellofemoral and moderate medial joint space degenerative change. Mild degenerative change of the lateral joint space with spurring. Trace knee effusion. Generalized subcutaneous edema. IMPRESSION: Tricompartment arthritis, most advanced involving the medial compartment. Trace knee effusion. Electronically Signed   By: Donavan Foil M.D.   On: 01/31/2020 16:13   US Venous Img Lower Bilateral  Result Date: 01/30/2020 CLINICAL DATA:  Initial evaluation for acute swelling, pain. EXAM: BILATERAL LOWER EXTREMITY VENOUS DOPPLER ULTRASOUND TECHNIQUE: Gray-scale sonography with graded compression, as well as color Doppler and duplex ultrasound were performed to evaluate the lower extremity deep venous systems from the level of the common femoral vein and including the common femoral, femoral, profunda femoral, popliteal and calf veins including the posterior tibial, peroneal and gastrocnemius veins when visible. The superficial great saphenous vein was also interrogated. Spectral Doppler was utilized to evaluate flow at rest and with distal augmentation maneuvers in the common femoral, femoral and popliteal veins. COMPARISON:  None. FINDINGS: RIGHT LOWER EXTREMITY Common Femoral Vein: No evidence of thrombus. Normal compressibility, respiratory phasicity and response to augmentation. Saphenofemoral Junction: No evidence of thrombus. Normal compressibility and flow on color Doppler imaging. Profunda Femoral Vein: No evidence of thrombus. Normal compressibility and flow on color Doppler  imaging. Femoral Vein: No evidence of thrombus. Normal compressibility, respiratory phasicity and response to augmentation. Popliteal Vein: No evidence of thrombus. Normal compressibility, respiratory phasicity and response to augmentation. Calf Veins: No evidence of thrombus. Normal compressibility and flow on color Doppler imaging. Peroneal vein not visualized. Superficial Great Saphenous Vein: No evidence of thrombus. Normal compressibility. Venous Reflux:  None. Other Findings:  None. LEFT LOWER EXTREMITY Common Femoral Vein: No evidence of thrombus. Normal compressibility, respiratory phasicity and response to augmentation. Saphenofemoral Junction: No evidence of thrombus. Normal compressibility and flow on color Doppler imaging. Profunda Femoral Vein: No evidence of thrombus. Normal compressibility and flow on color Doppler imaging. Femoral Vein: No evidence of thrombus. Normal compressibility, respiratory phasicity and response to augmentation. Popliteal Vein: No evidence of thrombus. Normal compressibility, respiratory phasicity and response to augmentation. Calf Veins: No evidence of thrombus. Normal compressibility and flow on color Doppler imaging. Peroneal vein not visualized. Superficial Great Saphenous Vein: No evidence of thrombus. Normal compressibility. Venous Reflux:  None. Other Findings:  None. IMPRESSION: No evidence of deep venous thrombosis in either lower extremity. Electronically Signed   By: Jeannine Boga M.D.   On: 01/30/2020 18:28   DG Chest Port 1 View  Result Date: 01/30/2020 CLINICAL DATA:  Lower extremity pain and swelling EXAM: PORTABLE CHEST 1 VIEW COMPARISON:  10/31/2019 FINDINGS: The heart size and mediastinal contours are within normal limits. Both lungs are clear. The visualized skeletal structures are unremarkable. IMPRESSION: No active disease. Electronically Signed   By: Donavan Foil M.D.   On: 01/30/2020 22:45    Positive ROS: All other systems have been  reviewed and were otherwise negative with the exception of those mentioned in the HPI and as above.  Physical Exam: General: Alert, no acute distress Cardiovascular: No pedal edema Respiratory: No cyanosis, no use of accessory musculature GI: No organomegaly, abdomen is soft and non-tender Skin: No lesions in the area of chief complaint Neurologic: Sensation intact distally Psychiatric: Patient is competent for consent with normal mood and affect Lymphatic: No axillary or cervical lymphadenopathy  MUSCULOSKELETAL: Mild bilateral swelling knees. Compartments soft. Good cap refill. Motor and sensory intact distally.  Assessment: Bilateral knee osteoarthitis  Plan: May discharge from an ortho standpoint.  Should follow up with DR. Bowers for further consultation in the next week or 2.  Please call office for appointment [phone].  WBAT as tolerated.  Patient has a walker at home.    Carlynn Spry, PA-C    02/01/2020 4:43 PM

## 2020-02-01 NOTE — Progress Notes (Addendum)
Inpatient Diabetes Program Recommendations  AACE/ADA: New Consensus Statement on Inpatient Glycemic Control   Target Ranges:  Prepandial:   less than 140 mg/dL      Peak postprandial:   less than 180 mg/dL (1-2 hours)      Critically ill patients:  140 - 180 mg/dL   Results for CHEETARA, HOGE (MRN [id number]) as of 02/01/2020 07:53  Ref. Range 01/31/2020 04:01 01/31/2020 07:32 01/31/2020 16:59 01/31/2020 20:29 01/31/2020 21:08 02/01/2020 00:40 02/01/2020 04:41  Glucose-Capillary Latest Ref Range: 70 - 99 mg/dL 145 (H) 231 (H) 279 (H) 422 (H) 404 (H) 490 (H) 478 (H)   Review of Glycemic Control  Diabetes history: DM2 Outpatient Diabetes medications: Levemir 60 units BID, Victoza 1.8 mg daily, Novolog 25 units TID with meals Current orders for Inpatient glycemic control: Levemir 60 units QHS, Novolog 0-6 units TID with meals, Novolog 0-5 units QHS, Novolog 6 units TID with meals; Prednisone 20 mg QAM  Inpatient Diabetes Program Recommendations:    Insulin-Basal: Please consider ordering Levemir 20 units QAM to start now.  Insulin-Meal Coverage: Please consider increasing meal coverage to Novolog 12 units TID with meals.  Thanks, Barnie Alderman, RN, MSN, CDE Diabetes Coordinator Inpatient Diabetes Program [phone] (Team Pager from 8am to 5pm)

## 2020-02-01 NOTE — Evaluation (Signed)
Physical Therapy Evaluation Patient Details Name: [name] MRN: [id number] DOB: 07/30/1955 Today's Date: 02/01/2020   History of Present Illness  presented to ER secondary to generalized fatigue, LE edema x3-4 days, missed diaysis x2 sessions; admitted for management of fluid overload related to CHF exacerbation.  Clinical Impression  Upon evaluation, patient alert and oriented; follows commands and agreeable to session.  Bilat UE/LE strength and ROM grossly symmetrical and WFL; no focal weakness appreciated.  Does endorse generalized pain/soreness to bilat knees; imaging negative for acute injury.  Able to complete bed mobility with mod indep; sit/stand, basic transfers and gait (30') with RW, cga/close sup.  Demonstrates broad BOS, reciprocal stepping pattern with fair step height/length; slow and deliberate with exaggerated weight shifting.  No overt buckling or LOB.  Additional distance/activity deferred, as transport present in room for dialysis session. Would benefit from skilled PT to address above deficits and promote optimal return to PLOF.; Recommend transition to HHPT upon discharge from acute hospitalization.     Follow Up Recommendations Home health PT    Equipment Recommendations   (has access to RW)    Recommendations for Other Services       Precautions / Restrictions Precautions Precautions: Fall Precaution Comments: L AVF Restrictions Weight Bearing Restrictions: No      Mobility  Bed Mobility Overal bed mobility: Modified Independent                Transfers Overall transfer level: Needs assistance Equipment used: Rolling walker (2 wheeled) Transfers: Sit to/from Stand Sit to Stand: Min guard;Min assist         General transfer comment: requires UE support for lift off, use of momentum to complete  Ambulation/Gait Ambulation/Gait assistance: Min guard;Supervision Gait Distance (Feet): 30 Feet Assistive device: Rolling walker (2 wheeled)        General Gait Details: broad BOS, reciprocal stepping pattern with fair step height/length; slow and deliberate with exaggerated weight shifting.  No overt buckling or LOB  Stairs            Wheelchair Mobility    Modified Rankin (Stroke Patients Only)       Balance Overall balance assessment: Needs assistance Sitting-balance support: No upper extremity supported;Feet supported Sitting balance-Leahy Scale: Good     Standing balance support: Bilateral upper extremity supported Standing balance-Leahy Scale: Fair                               Pertinent Vitals/Pain Pain Assessment: No/denies pain    Home Living Family/patient expects to be discharged to:: Private residence Living Arrangements: Children Available Help at Discharge: Family Type of Home: House Home Access: Level entry     Home Layout: One level Home Equipment: Environmental consultant - 2 wheels      Prior Function Level of Independence: Independent         Comments: Indep with ADLs, household and community mobilization without assist device; drives self to/from dialysis     Hand Dominance        Extremity/Trunk Assessment   Upper Extremity Assessment Upper Extremity Assessment: Overall WFL for tasks assessed (grossly at least 4+/5 throughout)    Lower Extremity Assessment Lower Extremity Assessment: Overall WFL for tasks assessed (grossly at least 4+/5 throughout)       Communication   Communication: No difficulties  Cognition Arousal/Alertness: Awake/alert Behavior During Therapy: WFL for tasks assessed/performed Overall Cognitive Status: Within Functional Limits for tasks assessed  General Comments      Exercises     Assessment/Plan    PT Assessment Patient needs continued PT services  PT Problem List Decreased strength;Decreased activity tolerance;Decreased balance;Decreased mobility;Cardiopulmonary status limiting  activity;Obesity       PT Treatment Interventions DME instruction;Gait training;Stair training;Functional mobility training;Therapeutic activities;Therapeutic exercise;Balance training;Cognitive remediation;Patient/family education    PT Goals (Current goals can be found in the Care Plan section)  Acute Rehab PT Goals Patient Stated Goal: to return home PT Goal Formulation: With patient Time For Goal Achievement: 02/15/20 Potential to Achieve Goals: Good    Frequency Min 2X/week   Barriers to discharge        Co-evaluation               AM-PAC PT "6 Clicks" Mobility  Outcome Measure Help needed turning from your back to your side while in a flat bed without using bedrails?: None Help needed moving from lying on your back to sitting on the side of a flat bed without using bedrails?: None Help needed moving to and from a bed to a chair (including a wheelchair)?: A Little Help needed standing up from a chair using your arms (e.g., wheelchair or bedside chair)?: A Little Help needed to walk in hospital room?: A Little Help needed climbing 3-5 steps with a railing? : A Little 6 Click Score: 20    End of Session Equipment Utilized During Treatment: Gait belt Activity Tolerance: Patient tolerated treatment well Patient left: in bed;with call bell/phone within reach;with bed alarm set Nurse Communication: Mobility status PT Visit Diagnosis: Muscle weakness (generalized) (M62.81);Difficulty in walking, not elsewhere classified (R26.2)    Time: 1007-1020 PT Time Calculation (min) (ACUTE ONLY): 13 min   Charges:   PT Evaluation $PT Eval Low Complexity: 1 Low          Valary Manahan H. Owens Shark, PT, DPT, NCS 02/01/20, 11:49 AM [phone]

## 2020-02-01 NOTE — Progress Notes (Signed)
OT Cancellation Note  Patient Details Name: [name] MRN: [id number] DOB: 04/25/1956   Cancelled Treatment:    Reason Eval/Treat Not Completed: Medical issues which prohibited therapy. Order received, chart reviewed. Upon chart review, pt most recent blood glucose reading from this morning is 391 mg/dL. Therapy contraindicated for blood glucose level >350 mg/dL. Will hold and re-attempt at a later date/time as pt medically appropriate and schedule allows.   Jerilynn Birkenhead, OTS 02/01/20, 11:43 AM

## 2020-02-02 ENCOUNTER — Telehealth: Payer: Self-pay | Admitting: Family

## 2020-02-02 NOTE — Telephone Encounter (Signed)
Unable to reach patient regarding her follow up chf clinic appointment that was made after hospital discharge.     [name], NT

## 2020-02-03 DIAGNOSIS — N186 End stage renal disease: Secondary | ICD-10-CM | POA: Diagnosis not present

## 2020-02-03 DIAGNOSIS — N2581 Secondary hyperparathyroidism of renal origin: Secondary | ICD-10-CM | POA: Diagnosis not present

## 2020-02-03 DIAGNOSIS — Z992 Dependence on renal dialysis: Secondary | ICD-10-CM | POA: Diagnosis not present

## 2020-02-05 ENCOUNTER — Other Ambulatory Visit: Payer: Self-pay | Admitting: Physician Assistant

## 2020-02-05 ENCOUNTER — Ambulatory Visit: Payer: Medicare HMO | Admitting: Physician Assistant

## 2020-02-05 DIAGNOSIS — I132 Hypertensive heart and chronic kidney disease with heart failure and with stage 5 chronic kidney disease, or end stage renal disease: Secondary | ICD-10-CM | POA: Diagnosis not present

## 2020-02-05 DIAGNOSIS — J45909 Unspecified asthma, uncomplicated: Secondary | ICD-10-CM | POA: Diagnosis not present

## 2020-02-05 DIAGNOSIS — E1151 Type 2 diabetes mellitus with diabetic peripheral angiopathy without gangrene: Secondary | ICD-10-CM | POA: Diagnosis not present

## 2020-02-05 DIAGNOSIS — I5042 Chronic combined systolic (congestive) and diastolic (congestive) heart failure: Secondary | ICD-10-CM | POA: Diagnosis not present

## 2020-02-05 DIAGNOSIS — I2510 Atherosclerotic heart disease of native coronary artery without angina pectoris: Secondary | ICD-10-CM | POA: Diagnosis not present

## 2020-02-05 DIAGNOSIS — M170 Bilateral primary osteoarthritis of knee: Secondary | ICD-10-CM | POA: Diagnosis not present

## 2020-02-05 DIAGNOSIS — E1122 Type 2 diabetes mellitus with diabetic chronic kidney disease: Secondary | ICD-10-CM | POA: Diagnosis not present

## 2020-02-05 DIAGNOSIS — N186 End stage renal disease: Secondary | ICD-10-CM | POA: Diagnosis not present

## 2020-02-05 DIAGNOSIS — E1142 Type 2 diabetes mellitus with diabetic polyneuropathy: Secondary | ICD-10-CM | POA: Diagnosis not present

## 2020-02-05 NOTE — Telephone Encounter (Signed)
Requested Prescriptions  Pending Prescriptions Disp Refills  . carvedilol (COREG) 25 MG tablet [Pharmacy Med Name: CARVEDILOL 25 MG TABLET] 180 tablet 1    Sig: TAKE (1) TABLET BY MOUTH TWICE A DAY WITH MEALS (BREAKFAST AND SUPPER)     Cardiovascular:  Beta Blockers Passed - 02/05/2020 11:45 AM      Passed - Last BP in normal range    BP Readings from Last 1 Encounters:  02/01/20 117/79         Passed - Last Heart Rate in normal range    Pulse Readings from Last 1 Encounters:  02/01/20 81         Passed - Valid encounter within last 6 months    Recent Outpatient Visits          1 week ago [place], [place], PA-C   3 months ago Diabetes mellitus type 2, uncontrolled, with complications [facility])   [place], [place], PA-C   5 months ago Chest pain, unspecified type   [facility], [place], [place]   8 months ago Acute right-sided low back pain without sciatica   [place], [place], [place]   11 months ago Chronic hepatitis C without hepatic coma [facility])   [place], Jennifer M, [place]             . atorvastatin (LIPITOR) 80 MG tablet [Pharmacy Med Name: ATORVASTATIN 80 MG TABLET] 28 tablet     Sig: TAKE 1 TABLET BY MOUTH ONCE DAILY AT 6PM     Cardiovascular:  Antilipid - Statins Failed - 02/05/2020 11:45 AM      Failed - Total Cholesterol in normal range and within 360 days    Cholesterol  Date Value Ref Range Status  09/13/2016 122 0 - 200 mg/dL Final         Failed - LDL in normal range and within 360 days    LDL Cholesterol  Date Value Ref Range Status  09/13/2016 52 0 - 99 mg/dL Final    Comment:           Total Cholesterol/HDL:CHD Risk Coronary Heart Disease Risk Table                     Men   Women  1/2 Average Risk   3.4   3.3  Average Risk       5.0   4.4  2 X Average Risk   9.6   7.1  3 X Average Risk  23.4   11.0         Use the calculated Patient Ratio above and the CHD Risk Table to determine the patient's CHD Risk.        ATP III CLASSIFICATION (LDL):  <100     mg/dL   Optimal  100-129  mg/dL   Near or Above                    Optimal  130-159  mg/dL   Borderline  160-189  mg/dL   High  >190     mg/dL   Very High          Failed - HDL in normal range and within 360 days    HDL  Date Value Ref Range Status  09/13/2016 36 (L) >40 mg/dL Final         Failed - Triglycerides in normal range and within  360 days    Triglycerides  Date Value Ref Range Status  09/13/2016 168 (H) <150 mg/dL Final         Passed - Patient is not pregnant      Passed - Valid encounter within last 12 months    Recent Outpatient Visits          1 week ago [place], [place], PA-C   3 months ago Diabetes mellitus type 2, uncontrolled, with complications [facility])   [place], [place]   5 months ago Chest pain, unspecified type   [place], [place]   8 months ago Acute right-sided low back pain without sciatica   [place], [place]   11 months ago Chronic hepatitis C without hepatic coma [facility])   [facility] [place], [place], [place]

## 2020-02-05 NOTE — Progress Notes (Deleted)
Established patient visit   Patient: [name]   DOB: [birth year]   64 y.o. Female  MRN: [id number] Visit Date: 02/05/2020  Today's healthcare provider: [name], PA-C   No chief complaint on file.  Subjective    HPI  Follow up Hospitalization  Patient was admitted to [facility] on 01/30/20 and discharged on 02/01/20. She was treated for Weakness. Treatment for this included ***. Telephone follow up was done on *** She reports {excellent/good/fair:19992} compliance with treatment. She reports this condition is {resolved/improved/worsened:23923}.  ----------------------------------------------------------------------------------------- -   {Show patient history (optional):23778::" "}   Medications: Outpatient Medications Prior to Visit  Medication Sig   albuterol (VENTOLIN HFA) 108 (90 Base) MCG/ACT inhaler Inhale 2 puffs into the lungs every 4 (four) hours as needed for wheezing or shortness of breath.   allopurinol (ZYLOPRIM) 100 MG tablet TAKE 1 TABLET BY MOUTH ONCE DAILY.   aspirin EC 81 MG EC tablet Take 1 tablet (81 mg total) by mouth daily.   atorvastatin (LIPITOR) 80 MG tablet TAKE 1 TABLET BY MOUTH ONCE DAILY AT 6PM (Patient taking differently: Take 80 mg by mouth daily at 6 PM. )   AURYXIA 1 GM 210 MG(Fe) tablet Take 420 mg by mouth 3 (three) times daily with meals.    b complex-vitamin c-folic acid (NEPHRO-VITE) 0.8 MG TABS tablet Take 1 tablet by mouth daily.    carvedilol (COREG) 25 MG tablet TAKE (1) TABLET BY MOUTH TWICE A DAY WITH MEALS (BREAKFAST AND SUPPER) (Patient taking differently: Take 25 mg by mouth 2 (two) times daily with a meal. )   cephALEXin (KEFLEX) 500 MG capsule Take 1 capsule (500 mg total) by mouth 2 (two) times daily. (Patient taking differently: Take 500 mg by mouth 2 (two) times daily. For 7 days)   Cinacalcet HCl (SENSIPAR PO) Take 30 mg by mouth every dialysis.   cyclobenzaprine (FLEXERIL) 5 MG tablet Take 1 tablet  (5 mg total) by mouth 3 (three) times daily as needed for muscle spasms.   dicyclomine (BENTYL) 20 MG tablet TAKE 1 TABLET(20 MG) BY MOUTH FOUR TIMES DAILY BEFORE MEALS AND AT BEDTIME (Patient taking differently: Take 20 mg by mouth 4 (four) times daily -  before meals and at bedtime. )   docusate sodium (COLACE) 100 MG capsule Take 1 capsule (100 mg total) by mouth 2 (two) times daily as needed for mild constipation.   doxepin (SINEQUAN) 10 MG capsule Take 1 capsule (10 mg total) by mouth 4 (four) times daily as needed. For itching   DULERA 200-5 MCG/ACT AERO Inhale 2 puffs into the lungs 2 (two) times daily as needed.    fluticasone (FLONASE) 50 MCG/ACT nasal spray Place 2 sprays into both nostrils daily as needed for allergies or rhinitis.   fluticasone furoate-vilanterol (BREO ELLIPTA) 200-25 MCG/INH AEPB Inhale 1 puff into the lungs daily. (Patient taking differently: Inhale 1 puff into the lungs daily as needed. )   gentamicin cream (GARAMYCIN) 0.1 % Apply 1 application topically 2 (two) times daily. (Patient taking differently: Apply 1 application topically 2 (two) times daily as needed. )   hydrALAZINE (APRESOLINE) 100 MG tablet TAKE (1) TABLET BY MOUTH THREE TIMES DAILY (Patient taking differently: Take 100 mg by mouth 3 (three) times daily. )   hydrOXYzine (ATARAX/VISTARIL) 25 MG tablet Take 25 mg by mouth 4 (four) times daily.   LEVEMIR FLEXTOUCH 100 UNIT/ML FlexPen INJECT 60 UNITS UNDER THE SKIN TWICE DAILY (Patient taking differently: Inject 60 Units  into the skin 2 (two) times daily. )   lidocaine-prilocaine (EMLA) cream Apply 1 application topically as needed.   liraglutide (VICTOZA) 18 MG/3ML SOPN Start with 0.6mg  daily and increase by 0.6mg  per week until max dose of 1.8 mg dose achieved. (Patient taking differently: Inject 1.8 mg into the skin daily. )   LOPERAMIDE HCL PO Take 1 tablet by mouth as needed.    loratadine (CLARITIN) 10 MG tablet TAKE 1 TABLET BY MOUTH ONCE  DAILY. (Patient taking differently: Take 10 mg by mouth at bedtime. )   mometasone (ELOCON) 0.1 % cream APPLY AS DIRECTED TO AFFECTED AREA ONCE DAILY. (Patient taking differently: Apply 1 application topically daily as needed. )   montelukast (SINGULAIR) 10 MG tablet TAKE ONE TABLET BY MOUTH AT BEDTIME. (Patient taking differently: Take 10 mg by mouth at bedtime. )   nitroGLYCERIN (NITROSTAT) 0.4 MG SL tablet Place 1 tablet (0.4 mg total) under the tongue every 5 (five) minutes x 3 doses as needed for chest pain.   NOVOLOG FLEXPEN 100 UNIT/ML FlexPen USE 25 UNITS UNDER THE SKIN THREE TIMES DAILY WITH MEALS (Patient taking differently: Inject 25 Units into the skin 3 (three) times daily with meals. )   Nutritional Supplements (FEEDING SUPPLEMENT, NEPRO CARB STEADY,) LIQD Take 237 mLs by mouth daily.   nystatin (MYCOSTATIN) 100000 UNIT/ML suspension Take 5 mLs (500,000 Units total) by mouth 4 (four) times daily.   nystatin cream (MYCOSTATIN) Apply 1 application topically 2 (two) times daily. (Patient taking differently: Apply 1 application topically daily as needed for dry skin. )   omeprazole (PRILOSEC) 20 MG capsule TAKE (1) CAPSULE BY MOUTH ONCE DAILY. (Patient taking differently: Take 20 mg by mouth every evening. )   oxyCODONE 10 MG TABS Take 1 tablet (10 mg total) by mouth every 6 (six) hours as needed for moderate pain or severe pain.   SENNA PLUS 8.6-50 MG tablet TAKE ONE TABLET BY MOUTH AT BEDTIME. (Patient taking differently: Take 1 tablet by mouth at bedtime as needed. )   silver sulfADIAZINE (SILVADENE) 1 % cream Apply 1 application topically daily. Apply to right first toe (Patient taking differently: Apply 1 application topically daily as needed (pain). )   torsemide (DEMADEX) 20 MG tablet TAKE (2) TABLETS BY MOUTH TWICE DAILY. (Patient taking differently: Take 40 mg by mouth 2 (two) times daily. )   triamcinolone cream (KENALOG) 0.1 % APPLY TO AFFECTED AREA TWICE DAILY (Patient  taking differently: 2 (two) times daily as needed. )   valACYclovir (VALTREX) 500 MG tablet TAKE (1) TABLET BY MOUTH EVERY OTHER DAY. (Patient taking differently: Take 500 mg by mouth every other day. )   Vitamin D, Ergocalciferol, (DRISDOL) 1.25 MG (50000 UT) CAPS capsule TAKE 1 CAPSULE BY MOUTH ONCE A MONTH (Patient taking differently: Take 50,000 Units by mouth every 30 (thirty) days. )   No facility-administered medications prior to visit.    Review of Systems  {Heme   Chem   Endocrine   Serology   Results Review (optional):23779::" "}  Objective    There were no vitals taken for this visit. {Show previous vital signs (optional):23777::" "}  Physical Exam  ***  No results found for any visits on 02/05/20.  Assessment & Plan     ***  No follow-ups on file.      {provider attestation***:1}   Rubye Beach  [facility] [phone] (phone) [phone] (fax)  [place]

## 2020-02-05 NOTE — Telephone Encounter (Signed)
Requested medication (s) are due for refill today: yes  Requested medication (s) are on the active medication list: yes  Last refill:  11/09/19  Future visit scheduled: no  Notes to clinic:  overdue for lab work   Requested Prescriptions  Pending Prescriptions Disp Refills   atorvastatin (LIPITOR) 80 MG tablet [Pharmacy Med Name: ATORVASTATIN 80 MG TABLET] 28 tablet     Sig: TAKE 1 TABLET BY MOUTH ONCE DAILY AT 6PM      Cardiovascular:  Antilipid - Statins Failed - 02/05/2020 11:45 AM      Failed - Total Cholesterol in normal range and within 360 days    Cholesterol  Date Value Ref Range Status  09/13/2016 122 0 - 200 mg/dL Final          Failed - LDL in normal range and within 360 days    LDL Cholesterol  Date Value Ref Range Status  09/13/2016 52 0 - 99 mg/dL Final    Comment:           Total Cholesterol/HDL:CHD Risk Coronary Heart Disease Risk Table                     Men   Women  1/2 Average Risk   3.4   3.3  Average Risk       5.0   4.4  2 X Average Risk   9.6   7.1  3 X Average Risk  23.4   11.0        Use the calculated Patient Ratio above and the CHD Risk Table to determine the patient's CHD Risk.        ATP III CLASSIFICATION (LDL):  <100     mg/dL   Optimal  100-129  mg/dL   Near or Above                    Optimal  130-159  mg/dL   Borderline  160-189  mg/dL   High  >190     mg/dL   Very High           Failed - HDL in normal range and within 360 days    HDL  Date Value Ref Range Status  09/13/2016 36 (L) >40 mg/dL Final          Failed - Triglycerides in normal range and within 360 days    Triglycerides  Date Value Ref Range Status  09/13/2016 168 (H) <150 mg/dL Final          Passed - Patient is not pregnant      Passed - Valid encounter within last 12 months    Recent Outpatient Visits           1 week ago [place], [place], PA-C   3 months ago Diabetes mellitus type 2, uncontrolled, with  complications [facility])   [place], [place], PA-C   5 months ago Chest pain, unspecified type   [place], [place]   8 months ago Acute right-sided low back pain without sciatica   [place], [place]   11 months ago Chronic hepatitis C without hepatic coma [facility])   [place], [place], [place]               Signed Prescriptions Disp Refills   carvedilol (COREG) 25 MG tablet 180 tablet 1    Sig: TAKE (  1) TABLET BY MOUTH TWICE A DAY WITH MEALS (BREAKFAST AND SUPPER)      Cardiovascular:  Beta Blockers Passed - 02/05/2020 11:45 AM      Passed - Last BP in normal range    BP Readings from Last 1 Encounters:  02/01/20 117/79          Passed - Last Heart Rate in normal range    Pulse Readings from Last 1 Encounters:  02/01/20 81          Passed - Valid encounter within last 6 months    Recent Outpatient Visits           1 week ago [place], [place]   3 months ago Diabetes mellitus type 2, uncontrolled, with complications [facility])   [place], [place]   5 months ago Chest pain, unspecified type   [place], [place]   8 months ago Acute right-sided low back pain without sciatica   [place], [place]   11 months ago Chronic hepatitis C without hepatic coma [facility])   [facility] [place], [place], [place]

## 2020-02-06 DIAGNOSIS — Z992 Dependence on renal dialysis: Secondary | ICD-10-CM | POA: Diagnosis not present

## 2020-02-06 DIAGNOSIS — N2581 Secondary hyperparathyroidism of renal origin: Secondary | ICD-10-CM | POA: Diagnosis not present

## 2020-02-06 DIAGNOSIS — N186 End stage renal disease: Secondary | ICD-10-CM | POA: Diagnosis not present

## 2020-02-06 DIAGNOSIS — E1129 Type 2 diabetes mellitus with other diabetic kidney complication: Secondary | ICD-10-CM | POA: Diagnosis not present

## 2020-02-07 ENCOUNTER — Other Ambulatory Visit: Payer: Self-pay | Admitting: Physician Assistant

## 2020-02-07 NOTE — Telephone Encounter (Signed)
Requested medication (s) are due for refill today:  Yes  Requested medication (s) are on the active medication list:   Yes  Future visit scheduled:   No   Last ordered: 11/07/2019 15 ml,  RF 3  Returning because pharmacy notification that it exceeds the recommended maximum dose of 68.7 units daily.  Provider to review for refill at this dose.   Requested Prescriptions  Pending Prescriptions Disp Refills   insulin detemir (LEVEMIR FLEXTOUCH) 100 UNIT/ML FlexPen [Pharmacy Med Name: LEVEMIR FLEX TOUCH PEN INJ 3ML] 15 mL 3    Sig: Inject 60 Units into the skin 2 (two) times daily.      Endocrinology:  Diabetes - Insulins Failed - 02/07/2020  9:29 AM      Failed - HBA1C is between 0 and 7.9 and within 180 days    Hgb A1c MFr Bld  Date Value Ref Range Status  01/30/2020 10.0 (H) 4.8 - 5.6 % Final    Comment:    (NOTE) Pre diabetes:          5.7%-6.4%  Diabetes:              >6.4%  Glycemic control for   <7.0% adults with diabetes           Passed - Valid encounter within last 6 months    Recent Outpatient Visits           1 week ago [place], [place], PA-C   3 months ago Diabetes mellitus type 2, uncontrolled, with complications [facility])   [place], [place], PA-C   6 months ago Chest pain, unspecified type   [place], [place]   8 months ago Acute right-sided low back pain without sciatica   [place], [place]   11 months ago Chronic hepatitis C without hepatic coma [facility])   [facility] [employer], [place], [place]

## 2020-02-08 ENCOUNTER — Telehealth: Payer: Medicare HMO

## 2020-02-08 ENCOUNTER — Other Ambulatory Visit: Payer: Self-pay | Admitting: Physician Assistant

## 2020-02-08 DIAGNOSIS — N186 End stage renal disease: Secondary | ICD-10-CM | POA: Diagnosis not present

## 2020-02-08 DIAGNOSIS — Z992 Dependence on renal dialysis: Secondary | ICD-10-CM | POA: Diagnosis not present

## 2020-02-08 DIAGNOSIS — N2581 Secondary hyperparathyroidism of renal origin: Secondary | ICD-10-CM | POA: Diagnosis not present

## 2020-02-08 MED ORDER — OXYCODONE HCL 10 MG PO TABS: 10.0000 mg | ORAL_TABLET | Freq: Four times a day (QID) | ORAL | 0 refills | Status: DC | PRN

## 2020-02-08 NOTE — Telephone Encounter (Signed)
Copied from [place] [id number]. Topic: General - Other >> Feb 08, 2020  4:05 PM Celene Kras wrote: Reason for CRM: Pt called and is requesting to speak with PCP. Pt states that she was discharged from the hospital last Thursday and was prescribed Oxycodone and is requesting to have a refill. Pt states that she is in a lot of pain still. Please advise.  [id number] [place], [place] [address] [place] [address] Phone: [phone] Fax: [phone] Hours: Not open 24 hours

## 2020-02-08 NOTE — Telephone Encounter (Signed)
Phone call to pt. To discuss pain.  Reported she has severe arthritis in knees, with left > right.  Reported that the pharmacy would not fill the prescription for Oxycodone 10 mg. When she was discharged from hospital on 7/22, explaining that the prescription was not specific enough.  Pt. Stated the pharmacy gave her a qty. Of 5 pills, until she could get in touch with her PCP.  Pt. Stated she is scheduled to see an Orthopedic surgeon on 8/11, to discuss a knee replacement.  Advised will send message [name].  Pt. Agreed with plan.

## 2020-02-08 NOTE — Telephone Encounter (Signed)
Requested medication (s) are due for refill today: yes  Requested medication (s) are on the active medication list: yes  Last refill:  02/08/20 #24 0 refills   Future visit scheduled: no  Notes to clinic:  not delegated per protocol - start 02/08/20 end 02/15/20     Requested Prescriptions  Pending Prescriptions Disp Refills   Oxycodone HCl 10 MG TABS 24 tablet 0    Sig: Take 1 tablet (10 mg total) by mouth every 6 (six) hours as needed for up to 7 days.      Not Delegated - Analgesics:  Opioid Agonists Failed - 02/08/2020  6:18 PM      Failed - This refill cannot be delegated      Failed - Urine Drug Screen completed in last 360 days.      Passed - Valid encounter within last 6 months    Recent Outpatient Visits           2 weeks ago [place], [place], [place]   3 months ago Diabetes mellitus type 2, uncontrolled, with complications [facility])   [place], Anderson Malta M, PA-C   6 months ago Chest pain, unspecified type   [facility], [place], [place]   8 months ago Acute right-sided low back pain without sciatica   [place], Vickki Muff, [place]   12 months ago Chronic hepatitis C without hepatic coma [facility])   [place], [place], [place]               Signed Prescriptions Disp Refills   Oxycodone HCl 10 MG TABS 24 tablet 0    Sig: Take 1 tablet (10 mg total) by mouth every 6 (six) hours as needed for up to 7 days.      Not Delegated - Analgesics:  Opioid Agonists Failed - 02/08/2020  4:42 PM      Failed - This refill cannot be delegated      Failed - Urine Drug Screen completed in last 360 days.      Passed - Valid encounter within last 6 months    Recent Outpatient Visits           2 weeks ago [place], [place], [place]   3 months ago Diabetes mellitus type 2, uncontrolled, with complications [facility])    [place], PA-C   6 months ago Chest pain, unspecified type   [place], [place]   8 months ago Acute right-sided low back pain without sciatica   [place], [place]   12 months ago Chronic hepatitis C without hepatic coma [facility])   [facility] [place], [place], [place]

## 2020-02-08 NOTE — Addendum Note (Signed)
Addended by: Mliss Sax on: 02/08/2020 06:18 PM   Modules accepted: Orders

## 2020-02-09 ENCOUNTER — Telehealth: Payer: Self-pay

## 2020-02-09 DIAGNOSIS — G8929 Other chronic pain: Secondary | ICD-10-CM

## 2020-02-09 DIAGNOSIS — M25562 Pain in left knee: Secondary | ICD-10-CM

## 2020-02-09 MED ORDER — OXYCODONE HCL 5 MG PO TABS: 5.0000 mg | ORAL_TABLET | Freq: Four times a day (QID) | ORAL | 0 refills | Status: AC | PRN

## 2020-02-09 NOTE — Telephone Encounter (Signed)
Copied from [place] [id number]. Topic: Quick Communication - Rx Refill/Question >> Feb 08, 2020  6:06 PM Erick Blinks wrote: Reason for CRM: Pharmacy called requesting to have Oxycodone 5 instead of 10. At the moment 10 is on back order. Please advise [facility] [phone] [address], [place] [address] [place] [address] Phone: [phone] Fax: [phone]

## 2020-02-09 NOTE — Telephone Encounter (Signed)
Changed per pharmacy request

## 2020-02-10 DIAGNOSIS — N186 End stage renal disease: Secondary | ICD-10-CM | POA: Diagnosis not present

## 2020-02-10 DIAGNOSIS — Z992 Dependence on renal dialysis: Secondary | ICD-10-CM | POA: Diagnosis not present

## 2020-02-10 DIAGNOSIS — E1122 Type 2 diabetes mellitus with diabetic chronic kidney disease: Secondary | ICD-10-CM | POA: Diagnosis not present

## 2020-02-14 ENCOUNTER — Ambulatory Visit: Payer: Medicare HMO | Admitting: Family

## 2020-02-15 DIAGNOSIS — N186 End stage renal disease: Secondary | ICD-10-CM | POA: Diagnosis not present

## 2020-02-15 DIAGNOSIS — Z992 Dependence on renal dialysis: Secondary | ICD-10-CM | POA: Diagnosis not present

## 2020-02-15 DIAGNOSIS — N2581 Secondary hyperparathyroidism of renal origin: Secondary | ICD-10-CM | POA: Diagnosis not present

## 2020-02-17 DIAGNOSIS — N2581 Secondary hyperparathyroidism of renal origin: Secondary | ICD-10-CM | POA: Diagnosis not present

## 2020-02-17 DIAGNOSIS — Z992 Dependence on renal dialysis: Secondary | ICD-10-CM | POA: Diagnosis not present

## 2020-02-17 DIAGNOSIS — N186 End stage renal disease: Secondary | ICD-10-CM | POA: Diagnosis not present

## 2020-02-19 ENCOUNTER — Telehealth: Payer: Self-pay | Admitting: Physician Assistant

## 2020-02-19 NOTE — Telephone Encounter (Signed)
Spoke with representative for [employer], she stated that they are still accepting applications for the elderly and that it would be best if the patient could complete the application online rather than to call. Care Guide will give patient this information.

## 2020-02-19 NOTE — Telephone Encounter (Signed)
   SF 02/19/2020   Name: SKI POLICH   MRN: [id number]   DOB: Feb 01, 1956   AGE: 64 y.o.   GENDER: female   PCP [name], PA-C.   Called pt regarding [employer] Referral for housing. Patient stated that she is still looking for housing. She would prefer something low income. 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   Follow up on: 02/20/20  [place], Care Management Phone: [phone] Email: [email or web address]

## 2020-02-20 DIAGNOSIS — N186 End stage renal disease: Secondary | ICD-10-CM | POA: Diagnosis not present

## 2020-02-20 DIAGNOSIS — Z992 Dependence on renal dialysis: Secondary | ICD-10-CM | POA: Diagnosis not present

## 2020-02-20 DIAGNOSIS — N2581 Secondary hyperparathyroidism of renal origin: Secondary | ICD-10-CM | POA: Diagnosis not present

## 2020-02-21 ENCOUNTER — Ambulatory Visit: Payer: Self-pay

## 2020-02-21 DIAGNOSIS — M1712 Unilateral primary osteoarthritis, left knee: Secondary | ICD-10-CM | POA: Diagnosis not present

## 2020-02-21 DIAGNOSIS — M25562 Pain in left knee: Secondary | ICD-10-CM | POA: Diagnosis not present

## 2020-02-21 DIAGNOSIS — M25561 Pain in right knee: Secondary | ICD-10-CM | POA: Diagnosis not present

## 2020-02-21 NOTE — Chronic Care Management (AMB) (Signed)
  Chronic Care Management   Outreach Note  02/21/2020 Name: [name] MRN: [id number] DOB: 1955-11-06  Primary Care Provider: [name], PA-C Reason for referral : Chronic Care Management   [name] was referred to the case management team for assistance with care management and care coordination. A outreach call was attempted today. Her daughter indicated that she was not available at the time of the call. Agreed to relay message regarding the outreach attempt.    Follow Up Plan Anticipate outreach within the next week.   [place] [facility] Care Management [phone]

## 2020-02-22 DIAGNOSIS — Z992 Dependence on renal dialysis: Secondary | ICD-10-CM | POA: Diagnosis not present

## 2020-02-22 DIAGNOSIS — N186 End stage renal disease: Secondary | ICD-10-CM | POA: Diagnosis not present

## 2020-02-22 DIAGNOSIS — N2581 Secondary hyperparathyroidism of renal origin: Secondary | ICD-10-CM | POA: Diagnosis not present

## 2020-02-22 NOTE — Telephone Encounter (Signed)
   SF 02/22/2020   Name: [name]   MRN: [id number]   DOB: 01-04-1956   AGE: 64 y.o.   GENDER: female   PCP [name], PA-C.   Called pt regarding [employer] Referral for housing. Left message for patient to give Care Guide a call. Will try to reach out to patient again at a later time.    Follow up on: 02/23/2020  [place], Care Management Phone: [phone] Email: [email or web address]

## 2020-02-23 ENCOUNTER — Other Ambulatory Visit: Payer: Self-pay | Admitting: Physician Assistant

## 2020-02-23 DIAGNOSIS — E559 Vitamin D deficiency, unspecified: Secondary | ICD-10-CM

## 2020-02-23 NOTE — Telephone Encounter (Signed)
Requested medication (s) are due for refill today: yes  Requested medication (s) are on the active medication list: yes   Last refill:  12/29/2019  Future visit scheduled: no  Notes to clinic:  review script for qty only has 28 tabs  Other refill cannot be delegated    Requested Prescriptions  Pending Prescriptions Disp Refills   atorvastatin (LIPITOR) 80 MG tablet [Pharmacy Med Name: ATORVASTATIN 80 MG TABLET] 28 tablet 0    Sig: TAKE 1 TABLET BY MOUTH ONCE DAILY AT 6PM      Cardiovascular:  Antilipid - Statins Failed - 02/23/2020 11:11 AM      Failed - Total Cholesterol in normal range and within 360 days    Cholesterol  Date Value Ref Range Status  09/13/2016 122 0 - 200 mg/dL Final          Failed - LDL in normal range and within 360 days    LDL Cholesterol  Date Value Ref Range Status  09/13/2016 52 0 - 99 mg/dL Final    Comment:           Total Cholesterol/HDL:CHD Risk Coronary Heart Disease Risk Table                     Men   Women  1/2 Average Risk   3.4   3.3  Average Risk       5.0   4.4  2 X Average Risk   9.6   7.1  3 X Average Risk  23.4   11.0        Use the calculated Patient Ratio above and the CHD Risk Table to determine the patient's CHD Risk.        ATP III CLASSIFICATION (LDL):  <100     mg/dL   Optimal  100-129  mg/dL   Near or Above                    Optimal  130-159  mg/dL   Borderline  160-189  mg/dL   High  >190     mg/dL   Very High           Failed - HDL in normal range and within 360 days    HDL  Date Value Ref Range Status  09/13/2016 36 (L) >40 mg/dL Final          Failed - Triglycerides in normal range and within 360 days    Triglycerides  Date Value Ref Range Status  09/13/2016 168 (H) <150 mg/dL Final          Passed - Patient is not pregnant      Passed - Valid encounter within last 12 months    Recent Outpatient Visits           4 weeks ago [place], [place], PA-C    3 months ago Diabetes mellitus type 2, uncontrolled, with complications [facility])   [place], PA-C   6 months ago Chest pain, unspecified type   [place], [place]   9 months ago Acute right-sided low back pain without sciatica   [place], [place]   1 year ago Chronic hepatitis C without hepatic coma [facility])   [place], Jennifer M, PA-C                pregabalin (LYRICA) 150 MG capsule [facility]  Med Name: PREGABALIN 150 MG CAPSULE] 56 capsule 0    Sig: TAKE (1) CAPSULE BY MOUTH TWICE DAILY.      Not Delegated - Neurology:  Anticonvulsants - Controlled Failed - 02/23/2020 11:11 AM      Failed - This refill cannot be delegated      Passed - Valid encounter within last 12 months    Recent Outpatient Visits           4 weeks ago [place], [place], [place]   3 months ago Diabetes mellitus type 2, uncontrolled, with complications [facility])   [place], [place]   6 months ago Chest pain, unspecified type   [place], [place]   9 months ago Acute right-sided low back pain without sciatica   [place], [place]   1 year ago Chronic hepatitis C without hepatic coma [facility])   [facility], [place], [place]

## 2020-02-23 NOTE — Telephone Encounter (Signed)
° °  SF 02/23/2020    Name: [name]    MRN: [id number]    DOB: 20-Feb-1956    AGE: 64 y.o.    GENDER: female    PCP [name], PA-C.   Spoke with [name] regarding referral for housing assistance. Gave patient information for [employer]. Informed patient that she can go online to apply for Elderly housing (age 72 and up). Patient stated understanding and that she will complete the application today. No additional needs at this time.   Closing referral pending any other needs of patient.    [place], Care Management Phone: [phone] Email: [email or web address]

## 2020-02-23 NOTE — Telephone Encounter (Signed)
Requested medication (s) are due for refill today: no  Requested medication (s) are on the active medication list: yes  Last refill:  12/08/2019  Future visit scheduled: no  Notes to clinic:  50,000 IU strengths are not delegated   Requested Prescriptions  Pending Prescriptions Disp Refills   Vitamin D, Ergocalciferol, (DRISDOL) 1.25 MG (50000 UNIT) CAPS capsule [Pharmacy Med Name: VIT D2 ERGOCAL (50,000 UNIT)] 1 capsule 0    Sig: TAKE 1 CAPSULE BY MOUTH ONCE A MONTH      Endocrinology:  Vitamins - Vitamin D Supplementation Failed - 02/23/2020 11:11 AM      Failed - 50,000 IU strengths are not delegated      Failed - Ca in normal range and within 360 days    Calcium  Date Value Ref Range Status  01/31/2020 8.0 (L) 8.9 - 10.3 mg/dL Final   Calcium, Ion  Date Value Ref Range Status  09/14/2015 1.00 (L) 1.12 - 1.23 mmol/L Final          Failed - Phosphate in normal range and within 360 days    Phosphorus  Date Value Ref Range Status  01/31/2020 5.1 (H) 2.5 - 4.6 mg/dL Final          Failed - Vitamin D in normal range and within 360 days    No results found for: [id number], [id number], [id number], [place], [place], [place], [place], [place], 25OHVITD2, 25OHVITD3, VD25OH        Passed - Valid encounter within last 12 months    Recent Outpatient Visits           4 weeks ago [place], [place], [place]   3 months ago Diabetes mellitus type 2, uncontrolled, with complications [facility])   [place], [place], [place]   6 months ago Chest pain, unspecified type   [facility], [place], [place]   9 months ago Acute right-sided low back pain without sciatica   [place], [place], [place]   1 year ago Chronic hepatitis C without hepatic coma [facility])   [place], [place], [place]

## 2020-02-26 ENCOUNTER — Other Ambulatory Visit: Payer: Self-pay | Admitting: Physician Assistant

## 2020-02-26 DIAGNOSIS — L299 Pruritus, unspecified: Secondary | ICD-10-CM

## 2020-02-26 DIAGNOSIS — E1140 Type 2 diabetes mellitus with diabetic neuropathy, unspecified: Secondary | ICD-10-CM

## 2020-02-26 NOTE — Telephone Encounter (Signed)
Requested medication (s) are due for refill today -no  Requested medication (s) are on the active medication list -no  Future visit scheduled -no  Last refill: 12/28/19  Notes to clinic: Request for non delegated Rx that has been discontinued and no longer on current medication list  Requested Prescriptions  Pending Prescriptions Disp Refills   pregabalin (LYRICA) 150 MG capsule [Pharmacy Med Name: PREGABALIN 150MG  CAPSULES] 60 capsule     Sig: TAKE 1 CAPSULE(150 MG) BY MOUTH TWICE DAILY      Not Delegated - Neurology:  Anticonvulsants - Controlled Failed - 02/26/2020  1:57 PM      Failed - This refill cannot be delegated      Passed - Valid encounter within last 12 months    Recent Outpatient Visits           1 month ago [place], [place], PA-C   3 months ago Diabetes mellitus type 2, uncontrolled, with complications [facility])   [place], [place], PA-C   6 months ago Chest pain, unspecified type   [facility], [place], [place]   9 months ago Acute right-sided low back pain without sciatica   [employer], [place], [place]   1 year ago Chronic hepatitis C without hepatic coma [facility])   [place], Anderson Malta M, [place]                  Requested Prescriptions  Pending Prescriptions Disp Refills   pregabalin (LYRICA) 150 MG capsule [Pharmacy Med Name: PREGABALIN 150MG  CAPSULES] 60 capsule     Sig: TAKE 1 CAPSULE(150 MG) BY MOUTH TWICE DAILY      Not Delegated - Neurology:  Anticonvulsants - Controlled Failed - 02/26/2020  1:57 PM      Failed - This refill cannot be delegated      Passed - Valid encounter within last 12 months    Recent Outpatient Visits           1 month ago [place], [place], PA-C   3 months ago Diabetes mellitus type 2, uncontrolled, with complications [facility])   [place], [place], PA-C   6 months ago Chest pain, unspecified type   [place], [place]   9 months ago Acute right-sided low back pain without sciatica   [place], [place]   1 year ago Chronic hepatitis C without hepatic coma [facility]   [facility], [place], [place]

## 2020-02-27 ENCOUNTER — Other Ambulatory Visit: Payer: Self-pay | Admitting: Physician Assistant

## 2020-02-27 DIAGNOSIS — Z992 Dependence on renal dialysis: Secondary | ICD-10-CM | POA: Diagnosis not present

## 2020-02-27 DIAGNOSIS — I5032 Chronic diastolic (congestive) heart failure: Secondary | ICD-10-CM

## 2020-02-27 DIAGNOSIS — N2581 Secondary hyperparathyroidism of renal origin: Secondary | ICD-10-CM | POA: Diagnosis not present

## 2020-02-27 DIAGNOSIS — N186 End stage renal disease: Secondary | ICD-10-CM | POA: Diagnosis not present

## 2020-02-29 DIAGNOSIS — Z992 Dependence on renal dialysis: Secondary | ICD-10-CM | POA: Diagnosis not present

## 2020-02-29 DIAGNOSIS — N2581 Secondary hyperparathyroidism of renal origin: Secondary | ICD-10-CM | POA: Diagnosis not present

## 2020-02-29 DIAGNOSIS — N186 End stage renal disease: Secondary | ICD-10-CM | POA: Diagnosis not present

## 2020-03-02 DIAGNOSIS — N2581 Secondary hyperparathyroidism of renal origin: Secondary | ICD-10-CM | POA: Diagnosis not present

## 2020-03-02 DIAGNOSIS — N186 End stage renal disease: Secondary | ICD-10-CM | POA: Diagnosis not present

## 2020-03-02 DIAGNOSIS — Z992 Dependence on renal dialysis: Secondary | ICD-10-CM | POA: Diagnosis not present

## 2020-03-05 DIAGNOSIS — N186 End stage renal disease: Secondary | ICD-10-CM | POA: Diagnosis not present

## 2020-03-05 DIAGNOSIS — Z992 Dependence on renal dialysis: Secondary | ICD-10-CM | POA: Diagnosis not present

## 2020-03-05 DIAGNOSIS — N2581 Secondary hyperparathyroidism of renal origin: Secondary | ICD-10-CM | POA: Diagnosis not present

## 2020-03-07 DIAGNOSIS — Z992 Dependence on renal dialysis: Secondary | ICD-10-CM | POA: Diagnosis not present

## 2020-03-07 DIAGNOSIS — N186 End stage renal disease: Secondary | ICD-10-CM | POA: Diagnosis not present

## 2020-03-07 DIAGNOSIS — N2581 Secondary hyperparathyroidism of renal origin: Secondary | ICD-10-CM | POA: Diagnosis not present

## 2020-03-09 DIAGNOSIS — N2581 Secondary hyperparathyroidism of renal origin: Secondary | ICD-10-CM | POA: Diagnosis not present

## 2020-03-09 DIAGNOSIS — Z992 Dependence on renal dialysis: Secondary | ICD-10-CM | POA: Diagnosis not present

## 2020-03-09 DIAGNOSIS — N186 End stage renal disease: Secondary | ICD-10-CM | POA: Diagnosis not present

## 2020-03-12 DIAGNOSIS — N2581 Secondary hyperparathyroidism of renal origin: Secondary | ICD-10-CM | POA: Diagnosis not present

## 2020-03-12 DIAGNOSIS — E1122 Type 2 diabetes mellitus with diabetic chronic kidney disease: Secondary | ICD-10-CM | POA: Diagnosis not present

## 2020-03-12 DIAGNOSIS — N186 End stage renal disease: Secondary | ICD-10-CM | POA: Diagnosis not present

## 2020-03-12 DIAGNOSIS — Z992 Dependence on renal dialysis: Secondary | ICD-10-CM | POA: Diagnosis not present

## 2020-03-13 ENCOUNTER — Telehealth: Payer: Self-pay | Admitting: Physician Assistant

## 2020-03-13 ENCOUNTER — Ambulatory Visit: Payer: Medicare HMO | Admitting: Registered"

## 2020-03-13 DIAGNOSIS — J45909 Unspecified asthma, uncomplicated: Secondary | ICD-10-CM | POA: Diagnosis not present

## 2020-03-13 DIAGNOSIS — M170 Bilateral primary osteoarthritis of knee: Secondary | ICD-10-CM | POA: Diagnosis not present

## 2020-03-13 DIAGNOSIS — E1151 Type 2 diabetes mellitus with diabetic peripheral angiopathy without gangrene: Secondary | ICD-10-CM | POA: Diagnosis not present

## 2020-03-13 DIAGNOSIS — I5042 Chronic combined systolic (congestive) and diastolic (congestive) heart failure: Secondary | ICD-10-CM | POA: Diagnosis not present

## 2020-03-13 DIAGNOSIS — N186 End stage renal disease: Secondary | ICD-10-CM | POA: Diagnosis not present

## 2020-03-13 DIAGNOSIS — E1142 Type 2 diabetes mellitus with diabetic polyneuropathy: Secondary | ICD-10-CM | POA: Diagnosis not present

## 2020-03-13 DIAGNOSIS — I132 Hypertensive heart and chronic kidney disease with heart failure and with stage 5 chronic kidney disease, or end stage renal disease: Secondary | ICD-10-CM | POA: Diagnosis not present

## 2020-03-13 DIAGNOSIS — I2510 Atherosclerotic heart disease of native coronary artery without angina pectoris: Secondary | ICD-10-CM | POA: Diagnosis not present

## 2020-03-13 DIAGNOSIS — G8929 Other chronic pain: Secondary | ICD-10-CM

## 2020-03-13 DIAGNOSIS — E1122 Type 2 diabetes mellitus with diabetic chronic kidney disease: Secondary | ICD-10-CM | POA: Diagnosis not present

## 2020-03-13 NOTE — Telephone Encounter (Signed)
Medication Refill - Medication: oxyCODONE (ROXICODONE) 5 MG immediate release tablet   Has the patient contacted their pharmacy? No. (Agent: If no, request that the patient contact the pharmacy for the refill.) (Agent: If yes, when and what did the pharmacy advise?)  Preferred Pharmacy (with phone number or street name): [id number] [address], [place] [address]  [place], [place] [address]  Phone:  [phone] Fax:  [phone]  Agent: Please be advised that RX refills may take up to 3 business days. We ask that you follow-up with your pharmacy.

## 2020-03-13 NOTE — Telephone Encounter (Signed)
Please review for refill. Medication is not on current med list.  Last refill: 02/02/20 #56 expired on 02/16/20.

## 2020-03-14 MED ORDER — OXYCODONE HCL 5 MG PO TABS: 5.0000 mg | ORAL_TABLET | Freq: Three times a day (TID) | ORAL | 0 refills | Status: AC | PRN

## 2020-03-14 NOTE — Telephone Encounter (Signed)
Called patient about her refilled No answer unable to [facility] voicemail is full. If patient calls back OK for [facility] nurse to give patient providers message.

## 2020-03-14 NOTE — Telephone Encounter (Signed)
Refilled short term. Will need re-evaluation or orthopedic re-evaluation for knee pain If still severe.

## 2020-03-15 NOTE — Telephone Encounter (Signed)
Pt. Given message. States she is seeing an orthopedic doctor, Dr. Clint Guy. States he has injected her left knee "with cortisone and he will do the right knee soon."

## 2020-03-20 ENCOUNTER — Telehealth: Payer: Self-pay | Admitting: Physician Assistant

## 2020-03-20 NOTE — Telephone Encounter (Signed)
Called patient and [facility] if patient calls back okay for [facility] to triage patient.

## 2020-03-20 NOTE — Telephone Encounter (Signed)
Can we please triage this patient? She is placed on my schedule with tongue swelling.

## 2020-03-20 NOTE — Progress Notes (Signed)
I,Roshena L Chambers,acting as a scribe for [employer], PA-C.,have documented all relevant documentation on the behalf of [name], PA-C,as directed by  [name], PA-C while in the presence of [name], [place].   Established patient visit   Patient: [name]   DOB: [birth year]   64 y.o. Female  MRN: [id number] Visit Date: 03/21/2020  Today's healthcare provider: [name], PA-C   Chief Complaint  Patient presents with  . Oral Pain   Subjective    HPI  Tongue pain: Patient complains of burning of the tongue for the past 2 months. She was last seen for thrush on 01/25/2020 and was given a prescription for Nystatin solution which she reports did not did not provide any relief. Patient states she has also tried sucking on ice cubes to relieve burning with no relief.  Patient Active Problem List   Diagnosis Date Noted  . Fluid overload 01/30/2020  . Cellulitis 01/16/2020  . Anemia due to end stage renal disease ([place]) 01/16/2020  . Diabetic polyneuropathy associated with type 2 diabetes mellitus ([place]) 11/06/2019  . Hemodialysis status ([place]) 11/06/2019  . Nystagmus 11/06/2019  . Intermittent claudication ([place]) 11/06/2019  . Vertigo of central origin 11/06/2019  . Weakness   . Hypervolemia associated with renal insufficiency 10/31/2019  . Ascending aortic aneurysm ([place]) 03/27/2019  . Puncture wound without foreign body of left forearm, initial encounter 01/02/2019  . Non-healing wound of upper extremity 12/28/2018  . Unspecified open wound of left upper arm, initial encounter 12/06/2018  . Complication from renal dialysis device 10/20/2018  . Iron deficiency anemia, unspecified 10/18/2018  . ESRD (end stage renal disease) on dialysis ([place]) 06/01/2018  . ARF (acute renal failure) ([place]) 05/09/2018  . Colon cancer screening   . LGSIL on Pap smear of cervix 01/27/2018  . Gout 12/25/2017  . AKI (acute kidney injury) ([place])  12/24/2017  . Hyperglycemia 12/24/2017  . Chronic diastolic heart failure ([place]) 09/30/2017  . Lymphedema 09/30/2017  . Aortic stenosis   . Acute pain of both knees 02/12/2017  . Chronic gout due to renal impairment of multiple sites without tophus 02/12/2017  . Esophageal dysphagia 02/12/2017  . Osteoarthritis 01/22/2017  . Diabetic hyperosmolar non-ketotic state ([place]) 12/13/2016  . CAD (coronary artery disease) 12/13/2016  . Hyperlipidemia 12/13/2016  . Hyperphosphatemia 12/13/2016  . Acute diastolic CHF (congestive heart failure) ([place])   . Asthma 11/29/2015  . Depression 09/05/2015  . GERD (gastroesophageal reflux disease) 03/25/2015  . Environmental allergies 03/14/2015  . SOB (shortness of breath) 02/15/2015  . Chronic hepatitis C without hepatic coma ([place]) 01/31/2015  . Diabetic neuropathy ([place]) 01/31/2015  . OSA (obstructive sleep apnea) 01/01/2015  . Poor dentition 11/13/2014  . Essential hypertension 10/08/2014  . Morbid (severe) obesity due to excess calories ([place]) 10/08/2014  . Type 2 diabetes mellitus with ESRD (end-stage renal disease) ([place]) 03/25/2012    Class: Chronic   Past Medical History:  Diagnosis Date  . Anemia   . Aortic stenosis    Echo 8/18: mean 13, peak 28, LVOT/AV mean velocity 0.51  . Arthritis   . Asthma    As a child   . Bronchitis   . CAD (coronary artery disease)    a. 09/2016: 50% Ost 1st Mrg stenosis, 50% 2nd Mrg stenosis, 20% Mid-Cx, 95% Prox LAD, 40% mid-LAD, and 10% dist-LAD stenosis. Staged PCI with DES to Prox-LAD.   [name] Chronic combined systolic and diastolic CHF (congestive heart  failure) ([place]) 2011   echo 2/18: EF 55-60, normal wall motion, grade 2 diastolic dysfunction, trivial AI // echo 3/18: Septal and apical HK, EF 45-50, normal wall motion, trivial AI, mild LAE, PASP 38 // echo 8/18: EF 60-65, normal wall motion, grade 1 diastolic dysfunction, calcified aortic valve leaflets, mild aortic stenosis (mean 13, peak 28, LVOT/AV mean  velocity 0.51), mild AI, moderate MAC, mild LAE, trivial TR   . Chronic kidney disease    STAGE 4  . Chronic kidney disease on chronic dialysis (HCC)    t, th, sat  . Complication of anesthesia   . Depression   . Diabetes mellitus Dx 1989  . Elevated lipids   . GERD (gastroesophageal reflux disease)   . Gout   . Heart murmur    asymptomatic  . Hepatitis C Dx 2013  . Hypertension Dx 1989  . Infected surgical wound    Lt arm  . Myocardial infarction ([place]) 07/2015  . Obesity   . Pancreatitis 2013  . Pneumonia   . Refusal of blood transfusions as patient is Jehovah's Witness   . Tendinitis   . Tremors of nervous system    LEFT HAND  . Ulcer 2010       Medications: Outpatient Medications Prior to Visit  Medication Sig  . albuterol (VENTOLIN HFA) 108 (90 Base) MCG/ACT inhaler Inhale 2 puffs into the lungs every 4 (four) hours as needed for wheezing or shortness of breath.  . allopurinol (ZYLOPRIM) 100 MG tablet TAKE 1 TABLET BY MOUTH ONCE DAILY.  [name] aspirin EC 81 MG EC tablet Take 1 tablet (81 mg total) by mouth daily.  [name] atorvastatin (LIPITOR) 80 MG tablet TAKE 1 TABLET BY MOUTH ONCE DAILY AT 6PM  . AURYXIA 1 GM 210 MG(Fe) tablet Take 420 mg by mouth 3 (three) times daily with meals.   [name] b complex-vitamin c-folic acid (NEPHRO-VITE) 0.8 MG TABS tablet Take 1 tablet by mouth daily.   . carvedilol (COREG) 25 MG tablet TAKE (1) TABLET BY MOUTH TWICE A DAY WITH MEALS (BREAKFAST AND SUPPER)  . cephALEXin (KEFLEX) 500 MG capsule Take 1 capsule (500 mg total) by mouth 2 (two) times daily. (Patient taking differently: Take 500 mg by mouth 2 (two) times daily. For 7 days)  . Cinacalcet HCl (SENSIPAR PO) Take 30 mg by mouth every dialysis.  [name] cyclobenzaprine (FLEXERIL) 5 MG tablet Take 1 tablet (5 mg total) by mouth 3 (three) times daily as needed for muscle spasms.  [name] dicyclomine (BENTYL) 20 MG tablet TAKE 1 TABLET(20 MG) BY MOUTH FOUR TIMES DAILY BEFORE MEALS AND AT BEDTIME (Patient taking  differently: Take 20 mg by mouth 4 (four) times daily -  before meals and at bedtime. )  . docusate sodium (COLACE) 100 MG capsule Take 1 capsule (100 mg total) by mouth 2 (two) times daily as needed for mild constipation.  [name] doxepin (SINEQUAN) 10 MG capsule TAKE 1 CAPSULE(10 MG) BY MOUTH FOUR TIMES DAILY AS NEEDED FOR ITCHING  . DULERA 200-5 MCG/ACT AERO Inhale 2 puffs into the lungs 2 (two) times daily as needed.   . fluticasone (FLONASE) 50 MCG/ACT nasal spray Place 2 sprays into both nostrils daily as needed for allergies or rhinitis.  . fluticasone furoate-vilanterol (BREO ELLIPTA) 200-25 MCG/INH AEPB Inhale 1 puff into the lungs daily. (Patient taking differently: Inhale 1 puff into the lungs daily as needed. )  . gentamicin cream (GARAMYCIN) 0.1 % Apply 1 application topically 2 (two) times daily. (Patient taking  differently: Apply 1 application topically 2 (two) times daily as needed. )  . hydrALAZINE (APRESOLINE) 100 MG tablet TAKE (1) TABLET BY MOUTH THREE TIMES DAILY (Patient taking differently: Take 100 mg by mouth 3 (three) times daily. )  . hydrOXYzine (ATARAX/VISTARIL) 25 MG tablet Take 25 mg by mouth 4 (four) times daily.  . insulin detemir (LEVEMIR FLEXTOUCH) 100 UNIT/ML FlexPen Inject 60 Units into the skin 2 (two) times daily.  [name] lidocaine-prilocaine (EMLA) cream Apply 1 application topically as needed.  . liraglutide (VICTOZA) 18 MG/3ML SOPN Start with 0.68m daily and increase by 0.636mper week until max dose of 1.8 mg dose achieved. (Patient taking differently: Inject 1.8 mg into the skin daily. )  . LOPERAMIDE HCL PO Take 1 tablet by mouth as needed.   . loratadine (CLARITIN) 10 MG tablet TAKE 1 TABLET BY MOUTH ONCE DAILY. (Patient taking differently: Take 10 mg by mouth at bedtime. )  . mometasone (ELOCON) 0.1 % cream APPLY AS DIRECTED TO AFFECTED AREA ONCE DAILY. (Patient taking differently: Apply 1 application topically daily as needed. )  . montelukast (SINGULAIR) 10 MG  tablet TAKE ONE TABLET BY MOUTH AT BEDTIME. (Patient taking differently: Take 10 mg by mouth at bedtime. )  . nitroGLYCERIN (NITROSTAT) 0.4 MG SL tablet Place 1 tablet (0.4 mg total) under the tongue every 5 (five) minutes x 3 doses as needed for chest pain.  . Marland KitchenOVOLOG FLEXPEN 100 UNIT/ML FlexPen USE 25 UNITS UNDER THE SKIN THREE TIMES DAILY WITH MEALS (Patient taking differently: Inject 25 Units into the skin 3 (three) times daily with meals. )  . Nutritional Supplements (FEEDING SUPPLEMENT, NEPRO CARB STEADY,) LIQD Take 237 mLs by mouth daily.  . Marland Kitchenystatin (MYCOSTATIN) 100000 UNIT/ML suspension Take 5 mLs (500,000 Units total) by mouth 4 (four) times daily.  . Marland Kitchenystatin cream (MYCOSTATIN) Apply 1 application topically 2 (two) times daily. (Patient taking differently: Apply 1 application topically daily as needed for dry skin. )  . omeprazole (PRILOSEC) 20 MG capsule TAKE (1) CAPSULE BY MOUTH ONCE DAILY. (Patient taking differently: Take 20 mg by mouth every evening. )  . oxyCODONE (OXY IR/ROXICODONE) 5 MG immediate release tablet Take 1 tablet (5 mg total) by mouth every 8 (eight) hours as needed for up to 10 days for severe pain.  . pregabalin (LYRICA) 150 MG capsule TAKE 1 CAPSULE(150 MG) BY MOUTH TWICE DAILY  . SENNA PLUS 8.6-50 MG tablet TAKE ONE TABLET BY MOUTH AT BEDTIME. (Patient taking differently: Take 1 tablet by mouth at bedtime as needed. )  . silver sulfADIAZINE (SILVADENE) 1 % cream Apply 1 application topically daily. Apply to right first toe (Patient taking differently: Apply 1 application topically daily as needed (pain). )  . torsemide (DEMADEX) 20 MG tablet TAKE (2) TABLETS BY MOUTH TWICE DAILY.  . Marland Kitchenriamcinolone cream (KENALOG) 0.1 % APPLY TO AFFECTED AREA TWICE DAILY (Patient taking differently: 2 (two) times daily as needed. )  . valACYclovir (VALTREX) 500 MG tablet TAKE (1) TABLET BY MOUTH EVERY OTHER DAY. (Patient taking differently: Take 500 mg by mouth every other day. )  .  Vitamin D, Ergocalciferol, (DRISDOL) 1.25 MG (50000 UNIT) CAPS capsule TAKE 1 CAPSULE BY MOUTH ONCE A MONTH   No facility-administered medications prior to visit.    Review of Systems  Constitutional: Negative for appetite change, chills, fatigue and fever.  Respiratory: Negative for chest tightness and shortness of breath.   Cardiovascular: Negative for chest pain and palpitations.  Gastrointestinal: Negative for abdominal pain,  nausea and vomiting.  Neurological: Negative for dizziness and weakness.    Last CBC Lab Results  Component Value Date   WBC 7.0 01/31/2020   HGB 9.3 (L) 01/31/2020   HCT 27.7 (L) 01/31/2020   MCV 93.3 01/31/2020   MCH 31.3 01/31/2020   RDW 15.9 (H) 01/31/2020   PLT 150 40/02/6760   Last metabolic panel Lab Results  Component Value Date   GLUCOSE 328 (H) 01/31/2020   NA 132 (L) 01/31/2020   K 4.2 01/31/2020   CL 93 (L) 01/31/2020   CO2 25 01/31/2020   BUN 59 (H) 01/31/2020   CREATININE 7.88 (H) 01/31/2020   GFRNONAA 5 (L) 01/31/2020   GFRAA 6 (L) 01/31/2020   CALCIUM 8.0 (L) 01/31/2020   PHOS 5.1 (H) 01/31/2020   PROT 7.6 01/31/2020   ALBUMIN 3.2 (L) 01/31/2020   LABGLOB 3.9 03/18/2017   AGRATIO 0.8 03/18/2017   BILITOT 0.8 01/31/2020   ALKPHOS 324 (H) 01/31/2020   AST 66 (H) 01/31/2020   ALT 86 (H) 01/31/2020   ANIONGAP 14 01/31/2020      Objective    There were no vitals taken for this visit. BP Readings from Last 3 Encounters:  03/21/20 (!) 145/79  02/01/20 117/79  01/25/20 137/74   Wt Readings from Last 3 Encounters:  03/21/20 300 lb (136.1 kg)  01/31/20 (!) 302 lb 14.6 oz (137.4 kg)  01/25/20 293 lb (132.9 kg)      Physical Exam Vitals reviewed.  Constitutional:      General: She is not in acute distress.    Appearance: Normal appearance. She is well-developed. She is obese. She is not ill-appearing (tired from dialysis) or diaphoretic.  HENT:     Head: Normocephalic and atraumatic.     Mouth/Throat:     Lips:  Pink. No lesions.     Mouth: Mucous membranes are moist.     Dentition: Normal dentition.     Tongue: Tongue lesions: swollen with fissures.     Palate: No mass and lesions.     Pharynx: Oropharynx is clear. Uvula midline. No pharyngeal swelling.  Neck:     Thyroid: No thyromegaly.     Vascular: No JVD.     Trachea: No tracheal deviation.  Cardiovascular:     Rate and Rhythm: Normal rate and regular rhythm.     Heart sounds: Normal heart sounds. No murmur heard.  No friction rub. No gallop.   Pulmonary:     Effort: Pulmonary effort is normal. No respiratory distress.     Breath sounds: Normal breath sounds. No wheezing or rales.  Musculoskeletal:     Cervical back: Normal range of motion and neck supple.  Lymphadenopathy:     Cervical: No cervical adenopathy.  Neurological:     Mental Status: She is alert.       No results found for any visits on 03/21/20.  Assessment & Plan     1. Mouth pain Complex medical history and medications that may contribute. DDx: glossitis vs burning mouth syndrome. Will try magic mouthwash as below. Patient already on max dose lidocaine for t2dm neuropathy so that would be max treatment for burning mouth syndrome. Call if worsening. May require ENT referral.  - magic mouthwash w/lidocaine SOLN; Take 5 mLs by mouth 3 (three) times daily as needed for mouth pain.  Dispense: 200 mL; Refill: 0  2. Class 3 severe obesity due to excess calories with serious comorbidity and body mass index (BMI) of 45.0 to 49.9  in adult [facility]) Counseled patient on healthy lifestyle modifications including dieting and exercise.  - buPROPion (WELLBUTRIN SR) 150 MG 12 hr tablet; Take 1 tablet (150 mg total) by mouth 2 (two) times daily.  Dispense: 180 tablet; Refill: 0   No follow-ups on file.      Reynolds Bowl, PA-C, have reviewed all documentation for this visit. The documentation on 03/28/20 for the exam, diagnosis, procedures, and orders are all accurate and  complete.   Rubye Beach  [facility] [phone] (phone) [phone] (fax)  [place]

## 2020-03-21 ENCOUNTER — Other Ambulatory Visit: Payer: Self-pay

## 2020-03-21 ENCOUNTER — Encounter: Payer: Self-pay | Admitting: Physician Assistant

## 2020-03-21 ENCOUNTER — Ambulatory Visit (INDEPENDENT_AMBULATORY_CARE_PROVIDER_SITE_OTHER): Payer: Medicare Other | Admitting: Physician Assistant

## 2020-03-21 VITALS — BP 145/79 | HR 87 | Temp 98.4°F | Resp 22 | Wt 300.0 lb

## 2020-03-21 DIAGNOSIS — Z6841 Body Mass Index (BMI) 40.0 and over, adult: Secondary | ICD-10-CM

## 2020-03-21 DIAGNOSIS — K1379 Other lesions of oral mucosa: Secondary | ICD-10-CM | POA: Diagnosis not present

## 2020-03-21 MED ORDER — MAGIC MOUTHWASH W/LIDOCAINE: 5.0000 mL | Freq: Three times a day (TID) | ORAL | 0 refills | Status: DC | PRN

## 2020-03-21 MED ORDER — BUPROPION HCL ER (SR) 150 MG PO TB12: 150.0000 mg | ORAL_TABLET | Freq: Two times a day (BID) | ORAL | 0 refills | Status: DC

## 2020-03-21 MED ORDER — NITROGLYCERIN 0.4 MG SL SUBL: 0.4000 mg | SUBLINGUAL_TABLET | SUBLINGUAL | 12 refills | Status: DC | PRN

## 2020-03-21 NOTE — Telephone Encounter (Signed)
Spoke to patient and she states that she was already seen for the same thing by [facility] on 01/25/2020. She was dx with a thrush and prescribed Nystatin. Just a [place]

## 2020-03-21 NOTE — Patient Instructions (Signed)
Glossitis Glossitis is inflammation of the tongue. This may be a stand-alone condition, or it may be a symptom of a different condition that you have. Generally, glossitis goes away when its cause is identified and treated. Glossitis can be dangerous if it causes difficulty breathing. What are the causes? This condition may be caused by many different things. Common causes include:  Viral, bacterial, or yeast infections.  Allergies.  Disorders that affect the skin and mucous membranes, such as certain autoimmune disorders.  Abnormal tissue growths (tumors).  Lack of healthy red blood cells (anemia).  Movement of stomach acid into the muscular tube that connects the mouth and the stomach (gastroesophageal reflux).  Lack of proper nutrition or certain vitamins.  Certain lifelong (chronic) medical conditions, such as diabetes. Sometimes, glossitis may not be caused by an underlying condition. In these cases, glossitis may be caused by:  Use of tobacco products, such as cigarettes, chewing tobacco, or e-cigarettes.  Excessive alcohol use.  Tongue injury or irritation.  Certain medicines, such as medicines to treat cancer. In some cases, the cause is not known. What increases the risk? The following factors may make you more likely to develop this condition:  Being a man.  Taking antibiotics or steroids, such as asthma medicines.  Drinking alcohol excessively.  Using tobacco products, such as cigarettes, chewing tobacco, or e-cigarettes.  Having a chronic medical condition, such as an immune disease or cancer.  Not brushing or flossing your teeth regularly.  Being anemic or not getting proper nutrition. What are the signs or symptoms? Symptoms of this condition vary depending on the cause. Symptoms may include:  Swelling of the tongue.  Pain and tenderness in the tongue. Sometimes, this condition is painless.  Changes in tongue color. The tongue may be pale or bright  red.  Smooth areas on the tongue's surface.  A small mass of tissue (node) or white patch on the tongue.  Difficulty chewing, swallowing, or talking.  Difficulty breathing. How is this diagnosed? This condition is diagnosed based on a physical exam and medical history. Your health care provider may ask you about your eating and drinking habits. You may also have tests, including:  Blood tests.  Removal of a small amount of cells from the tongue that are examined under a microscope (biopsy). You may be given the name of a dentist or a health care provider who specializes in ear, nose, and throat (ENT) problems (otolaryngologist). How is this treated? Treatment for this condition depends on the cause and may include:  Following instructions from your health care provider about keeping your mouth clean and avoiding irritants that may have caused your condition or made it worse.  Nutritional therapy. Your health care provider may tell you to change your eating and drinking habits or take a nutritional supplement.  Managing underlying conditions that may have caused your glossitis.  Medicines, such as: ? Corticosteroids to reduce inflammation. ? Antibiotics if your condition was caused by an infection. ? Medicines that numb your tongue or mouth (local anesthetics). Follow these instructions at home: Eating and drinking   Eat healthy foods. Follow instructions from your health care provider about eating or drinking restrictions.  If you drink alcohol, limit how much you have: ? 0-1 drink a day for women. ? 0-2 drinks a day for men.  Be aware of how much alcohol is in your drink. In the [place] one drink equals one 12 oz bottle of beer (355 mL), one 5 oz glass of  wine (148 mL), or one 1 oz glass of hard liquor (44 mL). Mouth care   Keep your teeth and mouth clean. This includes brushing and flossing frequently and having regular dental checkups.  If you wear dentures or dental  braces, work with your dentist to make sure they fit correctly.  Follow other instructions from your health care provider about how to take care of your mouth. He or she may recommend that you: ? Gently brush your tongue. ? Gargle with a salt-water mixture 3-4 times a day or as needed. To make a salt-water mixture, completely dissolve -1 tsp of salt in 1 cup of warm water. ? Avoid any irritants that may have caused your condition or made it worse, such as chemicals or certain foods. ? Avoid breath mints, antibacterial mouthwash, and chewing gum. General instructions   Do not use any products that contain nicotine or tobacco, such as cigarettes and e-cigarettes. If you need help quitting, ask your health care provider.  Take over-the-counter and prescription medicines only as told by your health care provider.  Take supplements only as told by your health care provider. Follow the directions carefully.  Keep all follow-up visits as told by your health care provider. This is important. Contact a health care provider if:  You have a fever.  You develop new symptoms.  You have symptoms that do not get better with medicine or get worse.  You have symptoms that do not go away after 10 days.  You cannot eat or drink because of your pain. Get help right away if:  You have severe pain or swelling.  You have difficulty breathing, swallowing, or talking. Summary  Glossitis is inflammation of the tongue. It can be caused by many different things.  Glossitis generally goes away when its cause is identified and treated.  Keep your teeth and mouth clean. This includes brushing and flossing frequently and having regular dental checkups.  Eat healthy foods. Follow instructions from your health care provider about eating or drinking restrictions. This information is not intended to replace advice given to you by your health care provider. Make sure you discuss any questions you have with  your health care provider. Document Revised: 10/25/2017 Document Reviewed: 10/25/2017 Elsevier Patient Education  [place].

## 2020-03-22 ENCOUNTER — Telehealth: Payer: Self-pay

## 2020-03-22 NOTE — Telephone Encounter (Signed)
Copied from [place] [id number]. Topic: General - Other >> Mar 21, 2020  4:18 PM [name] wrote: Pharmacy is requesting a callback for clarification on magic mouthwast prescription. Best contact [phone]

## 2020-03-22 NOTE — Telephone Encounter (Signed)
Clarification given. 

## 2020-03-25 ENCOUNTER — Encounter (INDEPENDENT_AMBULATORY_CARE_PROVIDER_SITE_OTHER): Payer: Medicare HMO

## 2020-03-25 ENCOUNTER — Ambulatory Visit (INDEPENDENT_AMBULATORY_CARE_PROVIDER_SITE_OTHER): Payer: Medicare HMO | Admitting: Vascular Surgery

## 2020-03-25 ENCOUNTER — Other Ambulatory Visit: Payer: Self-pay | Admitting: Physician Assistant

## 2020-03-25 DIAGNOSIS — E559 Vitamin D deficiency, unspecified: Secondary | ICD-10-CM

## 2020-03-25 DIAGNOSIS — Z794 Long term (current) use of insulin: Secondary | ICD-10-CM

## 2020-03-25 DIAGNOSIS — N184 Chronic kidney disease, stage 4 (severe): Secondary | ICD-10-CM

## 2020-03-25 DIAGNOSIS — E1122 Type 2 diabetes mellitus with diabetic chronic kidney disease: Secondary | ICD-10-CM

## 2020-03-25 NOTE — Telephone Encounter (Signed)
Requested medication (s) are due for refill today: yes  Requested medication (s) are on the active medication list: yes  Last refill:  02/23/20  Future visit scheduled: yes  Notes to clinic:  med not delehated to NT to RF-overdue BW   Requested Prescriptions  Pending Prescriptions Disp Refills   Vitamin D, Ergocalciferol, (DRISDOL) 1.25 MG (50000 UNIT) CAPS capsule [Pharmacy Med Name: VIT D2 ERGOCAL (50,000 UNIT)] 1 capsule     Sig: TAKE 1 CAPSULE BY MOUTH ONCE A MONTH      Endocrinology:  Vitamins - Vitamin D Supplementation Failed - 03/25/2020  4:03 PM      Failed - 50,000 IU strengths are not delegated      Failed - Ca in normal range and within 360 days    Calcium  Date Value Ref Range Status  01/31/2020 8.0 (L) 8.9 - 10.3 mg/dL Final   Calcium, Ion  Date Value Ref Range Status  09/14/2015 1.00 (L) 1.12 - 1.23 mmol/L Final          Failed - Phosphate in normal range and within 360 days    Phosphorus  Date Value Ref Range Status  01/31/2020 5.1 (H) 2.5 - 4.6 mg/dL Final          Failed - Vitamin D in normal range and within 360 days    No results found for: [id number], [id number], [id number], 25OHVITD3, 25OHVITD2, 25OHVITD3, 25OHVITD2, 25OHVITD1, 25OHVITD2, 25OHVITD3, VD25OH        Passed - Valid encounter within last 12 months    Recent Outpatient Visits           4 days ago Mouth pain   [place], Clearnce Sorrel, [place]   2 months ago [place], [place], [place]   4 months ago Diabetes mellitus type 2, uncontrolled, with complications [facility])   [place], [place], [place]   7 months ago Chest pain, unspecified type   [facility], [place], [place]   10 months ago Acute right-sided low back pain without sciatica   [facility], Vickki Muff, [place]       Future Appointments             In 3 months [place], Clearnce Sorrel, PA-C [employer], [facility]             Signed Prescriptions Disp Refills   VICTOZA 18 MG/3ML SOPN 9 mL 11    Sig: Start with 0.6mg  daily and increase by 0.6mg  per week until max dose of 1.8 mg dose achieved.      Endocrinology:  Diabetes - GLP-1 Receptor Agonists Failed - 03/25/2020  4:03 PM      Failed - HBA1C is between 0 and 7.9 and within 180 days    Hgb A1c MFr Bld  Date Value Ref Range Status  01/30/2020 10.0 (H) 4.8 - 5.6 % Final    Comment:    (NOTE) Pre diabetes:          5.7%-6.4%  Diabetes:              >6.4%  Glycemic control for   <7.0% adults with diabetes           Passed - Valid encounter within last 6 months    Recent Outpatient Visits           4 days ago Mouth pain   [facility] [name], [place]  2 months ago [place], [place]   4 months ago Diabetes mellitus type 2, uncontrolled, with complications [facility])   [place], [place]   7 months ago Chest pain, unspecified type   [place], [place]   10 months ago Acute right-sided low back pain without sciatica   [place], [place]       Future Appointments             In 3 months Burnette, Clearnce Sorrel, PA-C [employer], [place]

## 2020-03-25 NOTE — Telephone Encounter (Signed)
Requested Prescriptions  Pending Prescriptions Disp Refills  . Vitamin D, Ergocalciferol, (DRISDOL) 1.25 MG (50000 UNIT) CAPS capsule [Pharmacy Med Name: VIT D2 ERGOCAL (50,000 UNIT)] 1 capsule     Sig: TAKE 1 CAPSULE BY MOUTH ONCE A MONTH     Endocrinology:  Vitamins - Vitamin D Supplementation Failed - 03/25/2020  4:03 PM      Failed - 50,000 IU strengths are not delegated      Failed - Ca in normal range and within 360 days    Calcium  Date Value Ref Range Status  01/31/2020 8.0 (L) 8.9 - 10.3 mg/dL Final   Calcium, Ion  Date Value Ref Range Status  09/14/2015 1.00 (L) 1.12 - 1.23 mmol/L Final         Failed - Phosphate in normal range and within 360 days    Phosphorus  Date Value Ref Range Status  01/31/2020 5.1 (H) 2.5 - 4.6 mg/dL Final         Failed - Vitamin D in normal range and within 360 days    No results found for: [id number], [id number], [id number], 25OHVITD3, [place], [place], 25OHVITD2, 25OHVITD1, 25OHVITD2, 25OHVITD3, VD25OH       Passed - Valid encounter within last 12 months    Recent Outpatient Visits          4 days ago Mouth pain   [place], Clearnce Sorrel, [place]   2 months ago [place], [place], [place]   4 months ago Diabetes mellitus type 2, uncontrolled, with complications [facility])   [place], [place], [place]   7 months ago Chest pain, unspecified type   [facility], [place], [place]   10 months ago Acute right-sided low back pain without sciatica   [facility], Vickki Muff, [place]      Future Appointments            In 3 months Burnette, Clearnce Sorrel, PA-C [place], [place] 18 MG/3ML SOPN [Pharmacy Med Name: VICTOZA 3-PAK 18 MG/3 ML PEN] 9 mL 11    Sig: Start with 0.6mg  daily and increase by 0.6mg  per week until max dose of 1.8 mg dose achieved.     Endocrinology:  Diabetes -  GLP-1 Receptor Agonists Failed - 03/25/2020  4:03 PM      Failed - HBA1C is between 0 and 7.9 and within 180 days    Hgb A1c MFr Bld  Date Value Ref Range Status  01/30/2020 10.0 (H) 4.8 - 5.6 % Final    Comment:    (NOTE) Pre diabetes:          5.7%-6.4%  Diabetes:              >6.4%  Glycemic control for   <7.0% adults with diabetes          Passed - Valid encounter within last 6 months    Recent Outpatient Visits          4 days ago Mouth pain   [place], Clearnce Sorrel, [place]   2 months ago [place], [place], [place]   4 months ago Diabetes mellitus type 2, uncontrolled, with complications [facility])   [place], [place]   7 months ago Chest pain, unspecified type   [facility], [place], [place]  10 months ago Acute right-sided low back pain without sciatica   [place], [place]      Future Appointments            In 3 months Burnette, Clearnce Sorrel, PA-C [employer], [place]

## 2020-03-26 ENCOUNTER — Telehealth: Payer: Self-pay

## 2020-03-26 NOTE — Telephone Encounter (Signed)
  Chronic Care Management   Outreach Note  03/26/2020 Name: [name] MRN: [id number] DOB: [birth year]  Primary Care Provider: [name], PA-C Reason for referral : Chronic Care Management   Another  unsuccessful telephone outreach was attempted today. [name] was previously referred to the case management team for CCM Nursing, LCSW and Pharmacy services.   Phone rang multiple times without option to leave a voice message.   PLAN A member of the care management team will attempt to reach [name] again within the next two weeks.    [facility] Care Management [facility] [phone]

## 2020-03-29 DIAGNOSIS — T782XXA Anaphylactic shock, unspecified, initial encounter: Secondary | ICD-10-CM | POA: Insufficient documentation

## 2020-03-29 DIAGNOSIS — T7840XA Allergy, unspecified, initial encounter: Secondary | ICD-10-CM | POA: Insufficient documentation

## 2020-04-04 IMAGING — US US HEPATIC LIVER DOPPLER
1 series · 14 of 25 positions shown · non-contrast
Comparison: None.

CLINICAL DATA: Chronic hepatitis-C.

EXAM:
DUPLEX ULTRASOUND OF LIVER
TECHNIQUE: Color and duplex Doppler ultrasound was performed to evaluate the
hepatic in-flow and out-flow vessels.

[Series 1: us hepatic liver doppler · 14 of 63 slices shown]
[im 1/63]
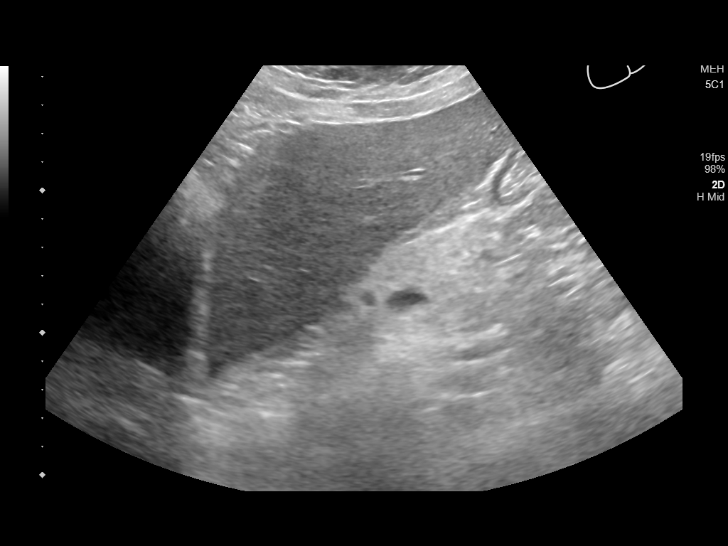
[im 6/63]
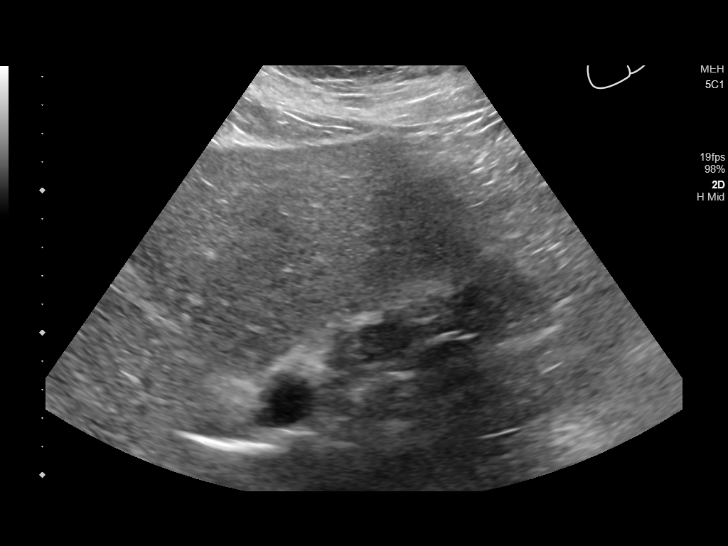
[im 11/63]
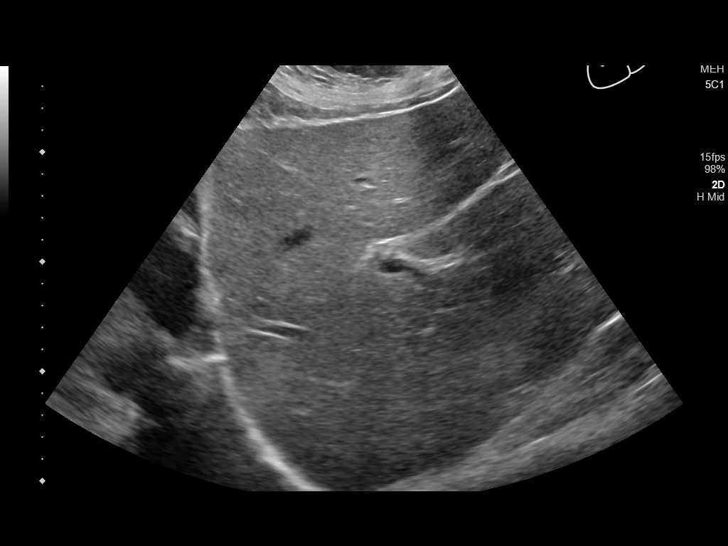
[im 16/63]
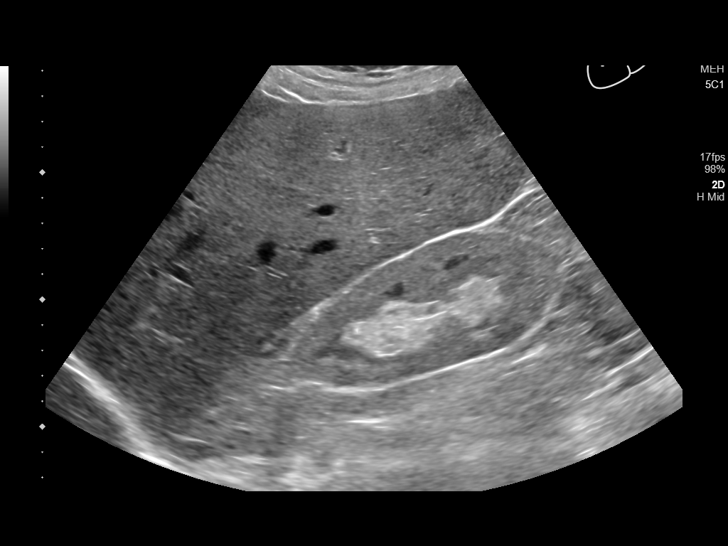
[im 21/63]
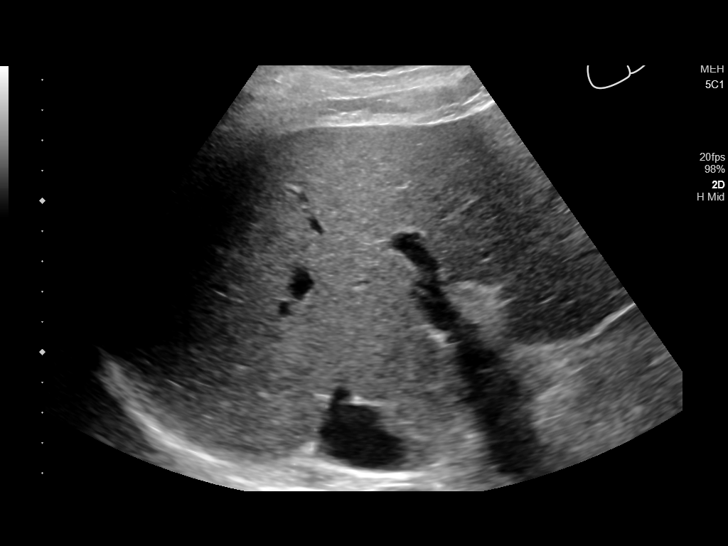
[im 24/63]
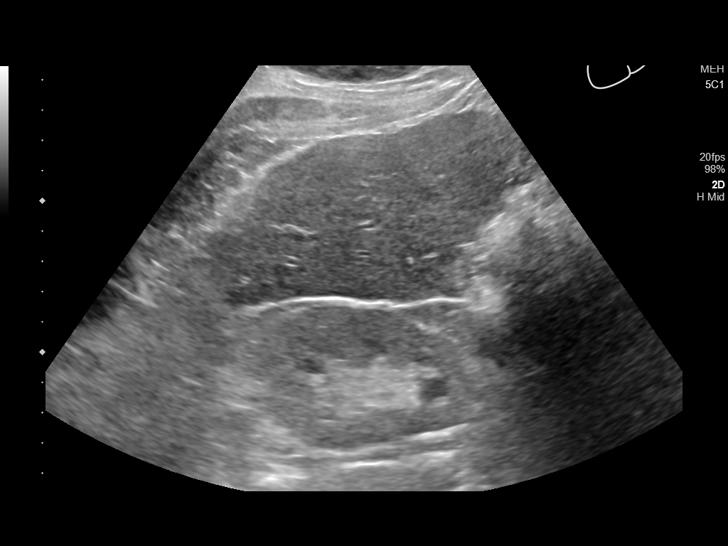
[im 29/63]
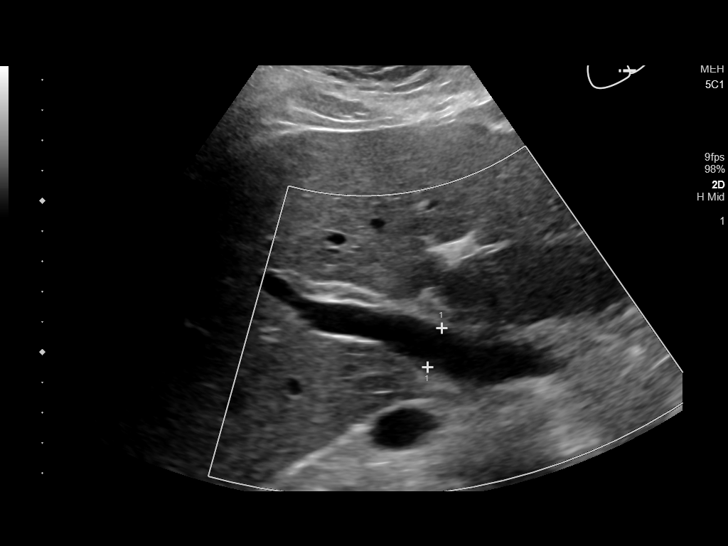
[im 34/63]
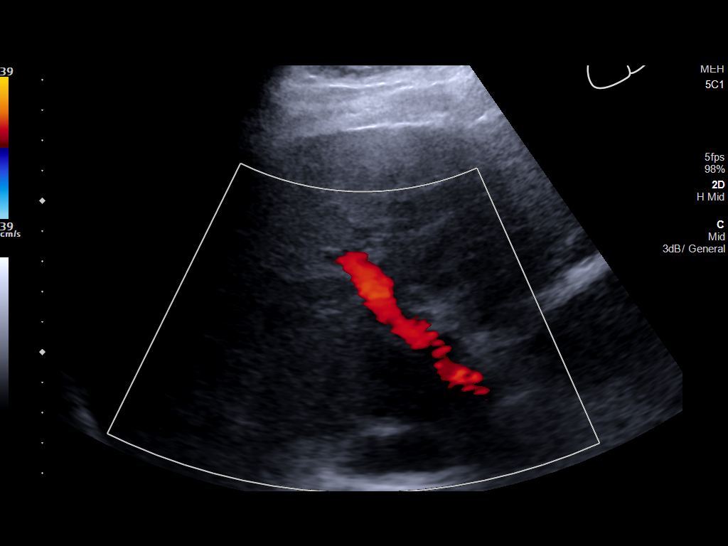
[im 39/63]
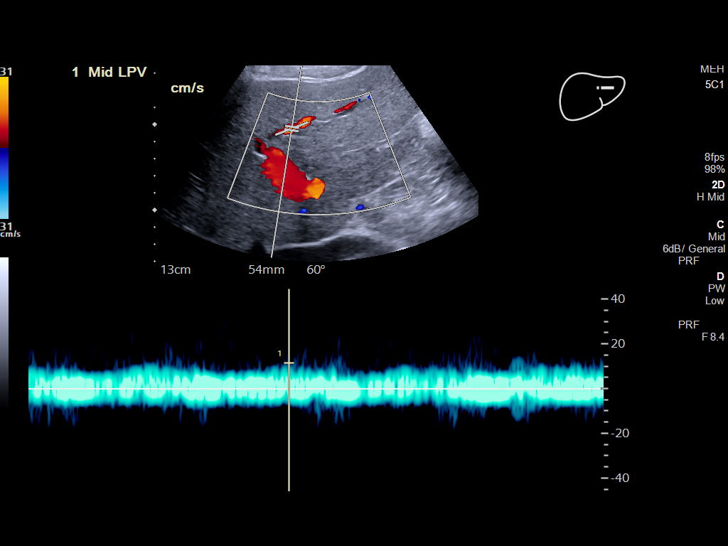
[im 42/63]
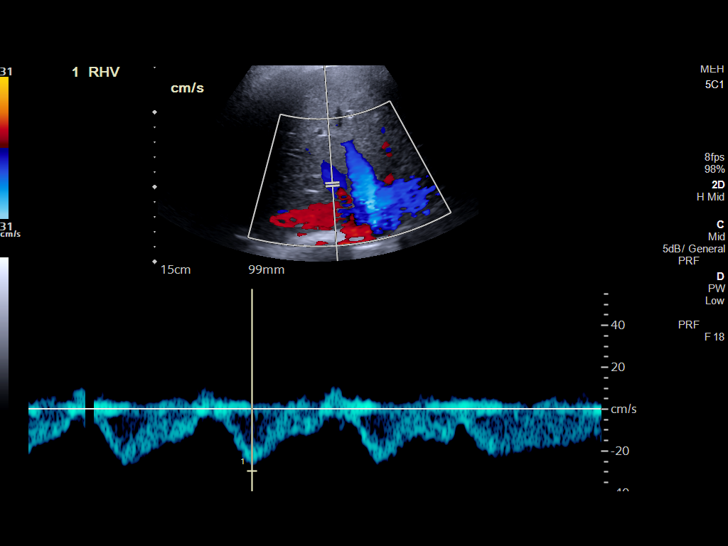
[im 47/63]
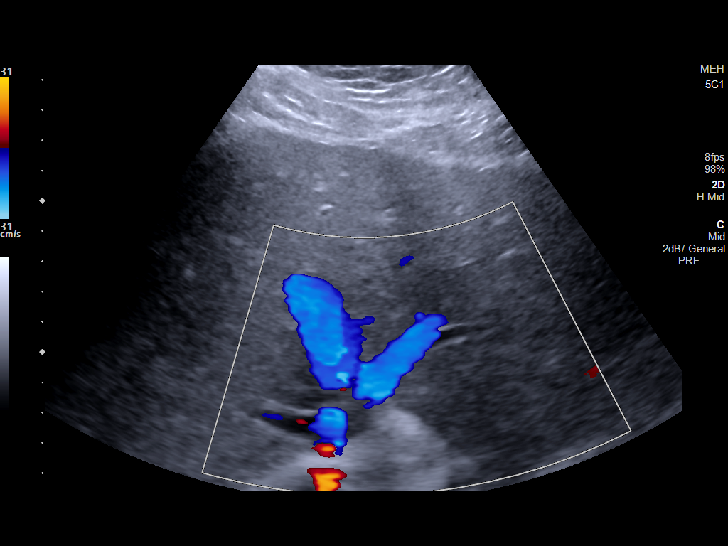
[im 52/63]
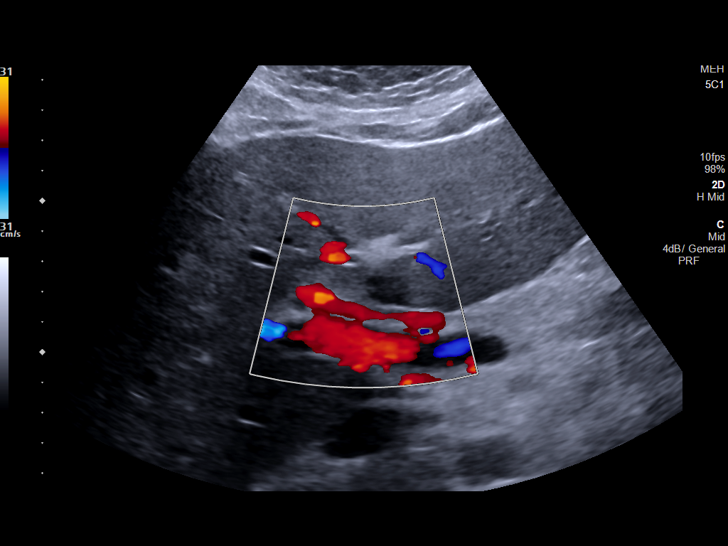
[im 57/63]
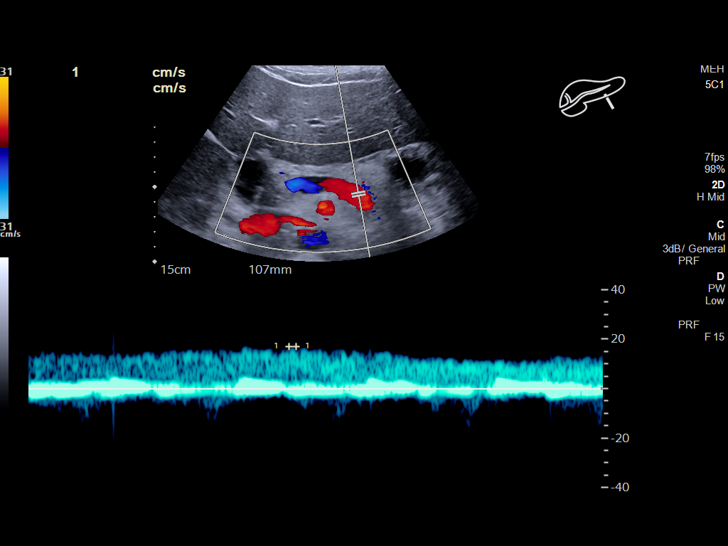
[im 63/63]
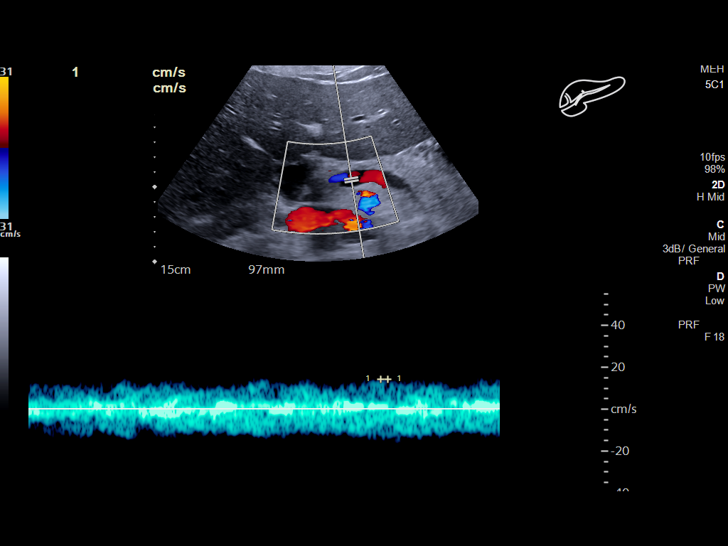

[14 of 25 positions shown; findings below may reference images not displayed]

FINDINGS: Liver: Normal parenchymal echogenicity. Normal hepatic contour
without nodularity.

No focal lesion, mass or intrahepatic biliary ductal dilatation.

Portal Vein Velocities

Main:  35 cm/sec

Right:  23 cm/sec

Left:  11 cm/sec

Normal hepatopetal flow is noted in the portal veins.

Hepatic Vein Velocities

Right:  30 cm/sec

Middle:  27 cm/sec

Left:  33 cm/sec

Normal hepatopetal flow is noted in the hepatic veins.

IVC: Present and patent with normal respiratory phasicity.

Hepatic Artery Velocity:  53 cm/sec

Splenic Vein Velocity:  22 cm/sec

Varices: Absent.

Ascites: Absent.

Spleen appears to be normal in size and appearance.
IMPRESSION: No evidence of hepatic, portal or splenic venous thrombosis or
occlusion.

## 2020-04-08 ENCOUNTER — Encounter: Payer: Self-pay | Admitting: Physician Assistant

## 2020-04-11 ENCOUNTER — Telehealth: Payer: Medicare Other

## 2020-04-11 ENCOUNTER — Encounter: Payer: Self-pay | Admitting: Physician Assistant

## 2020-04-11 ENCOUNTER — Telehealth: Payer: Self-pay

## 2020-04-11 NOTE — Telephone Encounter (Signed)
  Chronic Care Management   Outreach Note  04/11/2020 Name: [name] MRN: [id number] DOB: [birth year]  Primary Care Provider: [name], PA-C Reason for referral : Chronic Care Management   An unsuccessful telephone outreach was attempted today. [name] was previously referred to the case management team for assistance with care management and care coordination.     Follow Up Plan:  A HIPAA compliant voice message was left today requesting a return call.    [facility] Care Management [facility] [phone]

## 2020-04-13 IMAGING — DX DG FOOT 2V*R*
2 series · 2 of 2 positions shown · non-contrast
Comparison: RIGHT foot radiograph April 11, 2017

CLINICAL DATA: RIGHT big toe pain for 2 days. No injury. History of
diabetes.

EXAM:
RIGHT FOOT - 2 VIEW

[foot ap]
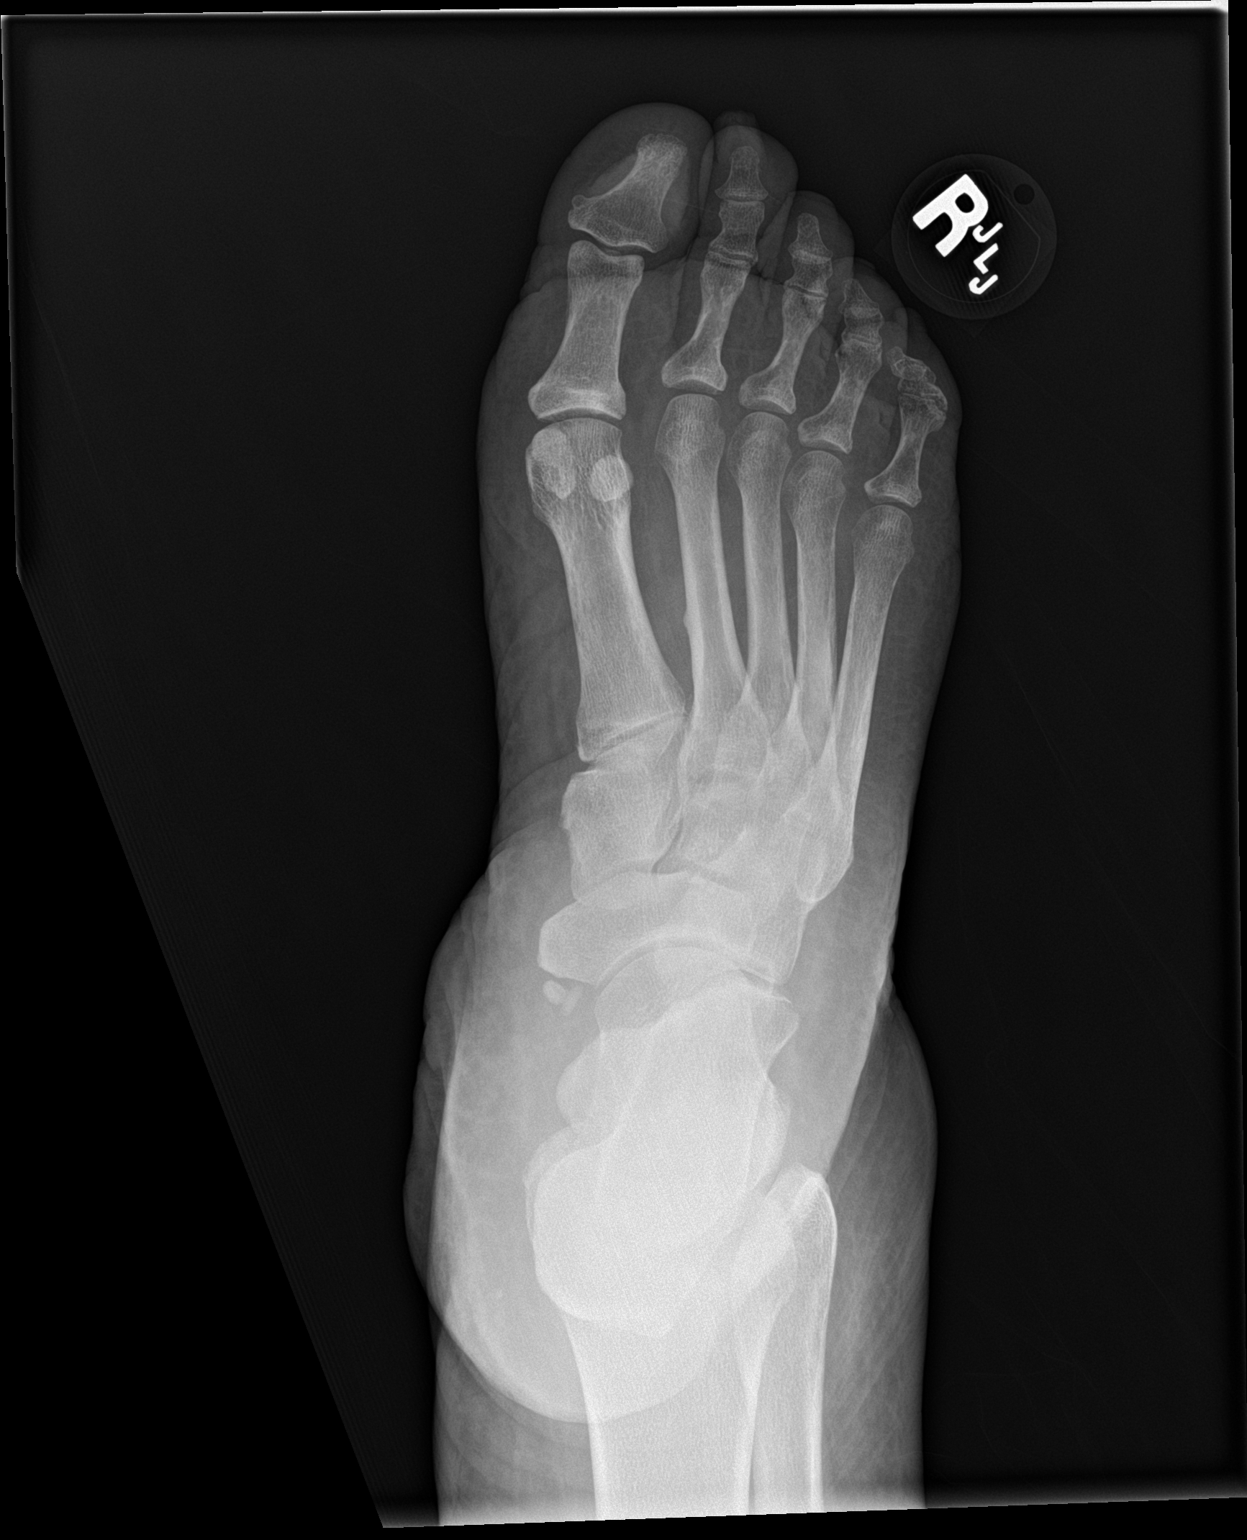

[foot lat]
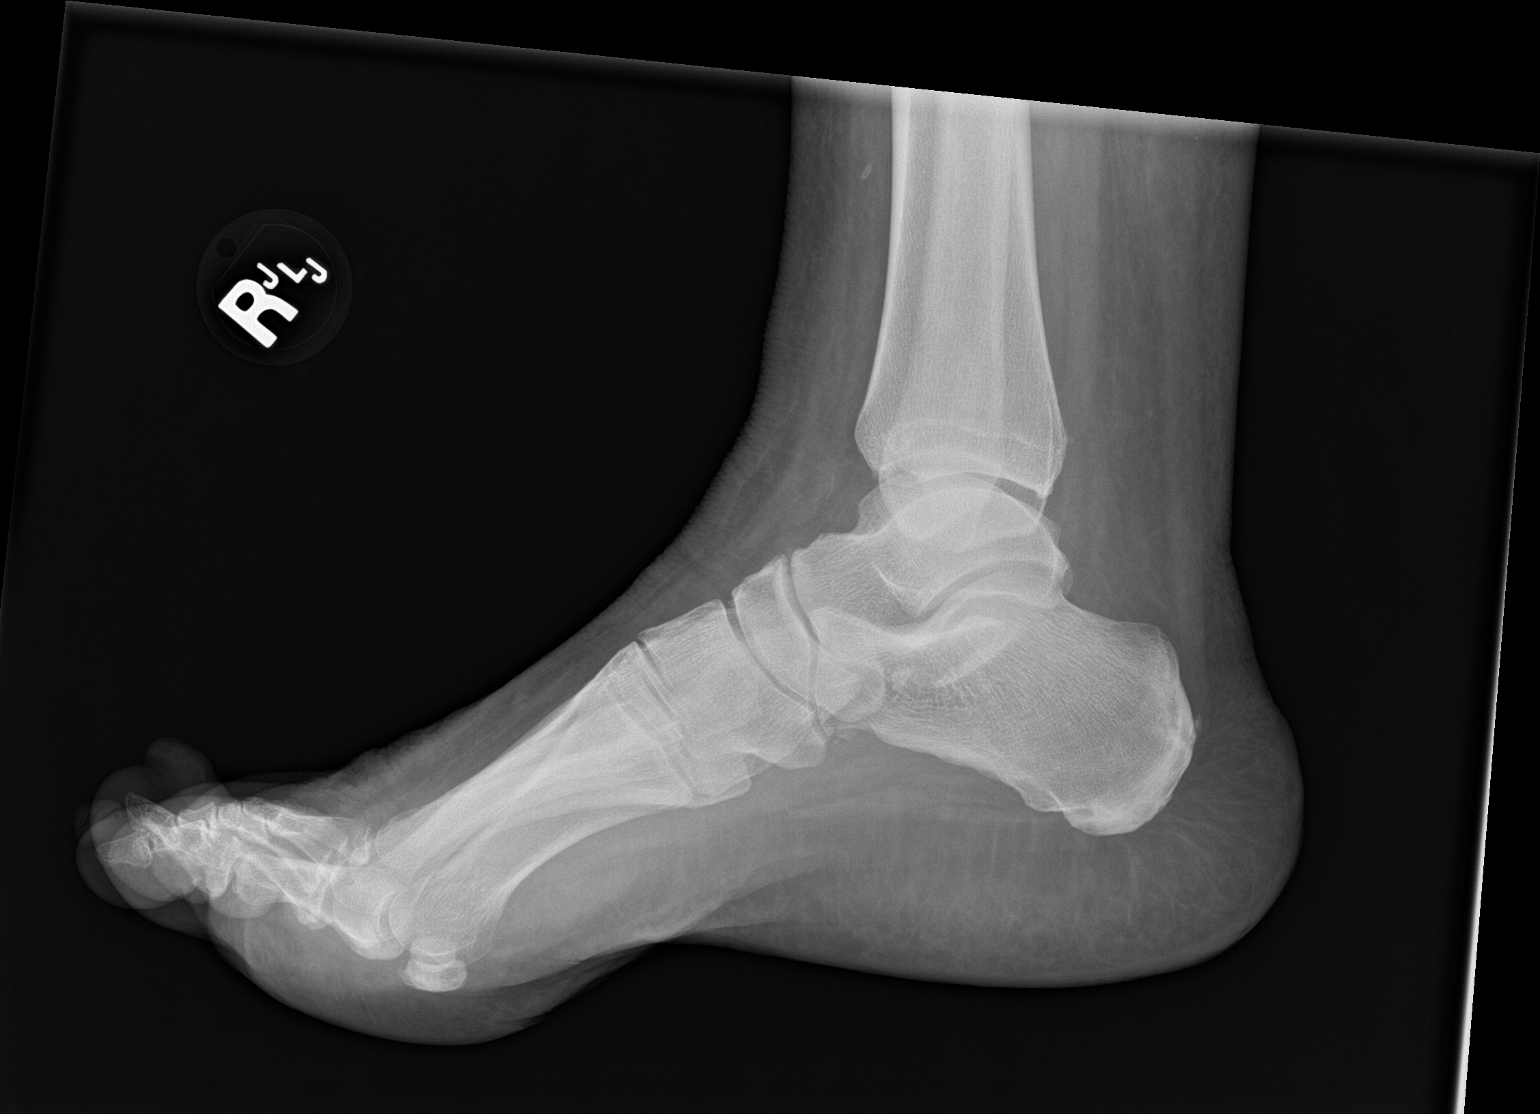

[2 of 2 positions shown; findings below may reference images not displayed]

FINDINGS: No acute fracture deformity or dislocation. Chronic deformity fifth
PIP, probable old injury. No destructive bony lesions. Soft tissue
planes are not suspicious.
IMPRESSION: Stable examination, no acute osseous process.

## 2020-04-13 IMAGING — DX DG KNEE 1-2V*R*
2 series · 2 of 2 positions shown · non-contrast
Comparison: None.

CLINICAL DATA: Right knee pain

EXAM:
RIGHT KNEE - 1-2 VIEW

[knee ap]
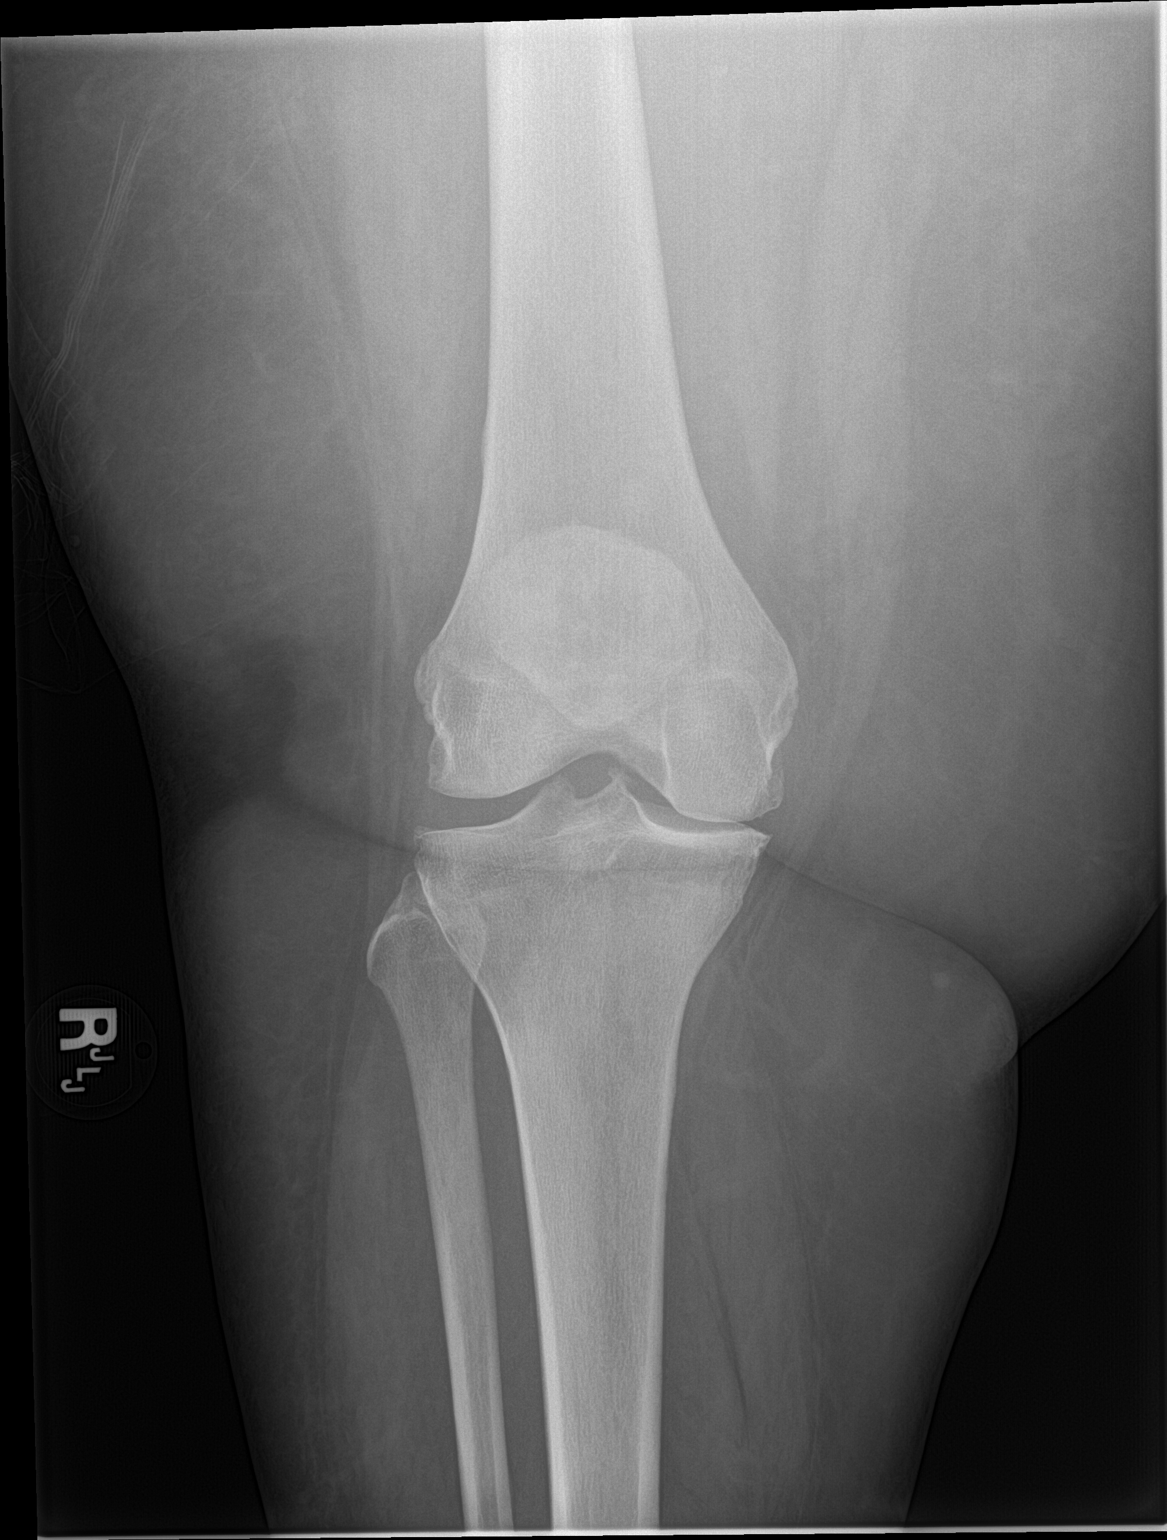

[knee lat]
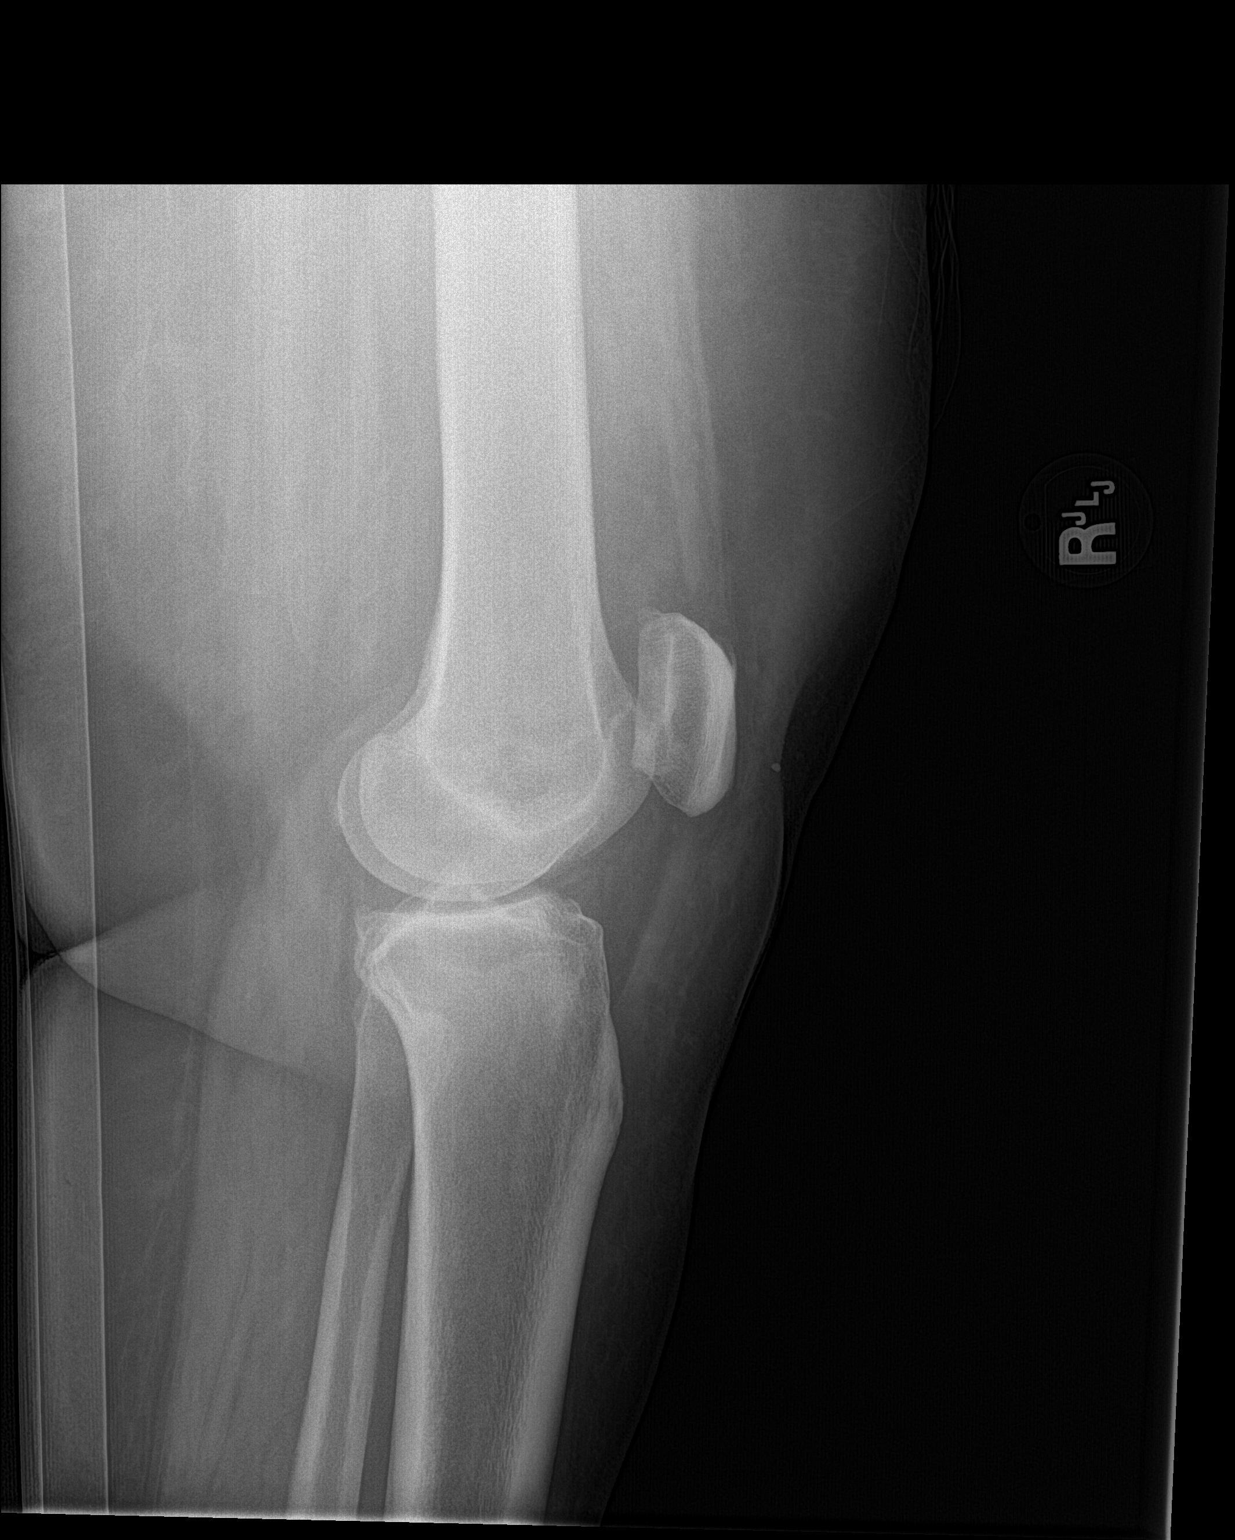

[2 of 2 positions shown; findings below may reference images not displayed]

FINDINGS: No fracture or malalignment. Moderate arthritis of the medial
compartment. Mild patellofemoral degenerative changes. No
significant knee effusion
IMPRESSION: Mild to moderate arthritis of the knee. No acute osseous
abnormality.

## 2020-04-13 IMAGING — US US RENAL
1 series · 14 of 25 positions shown · non-contrast
Comparison: 09/14/2017

CLINICAL DATA: Acute renal failure

EXAM:
RENAL / URINARY TRACT ULTRASOUND COMPLETE

[Series 1: us renal · 14 of 25 slices shown]
[im 1/25]
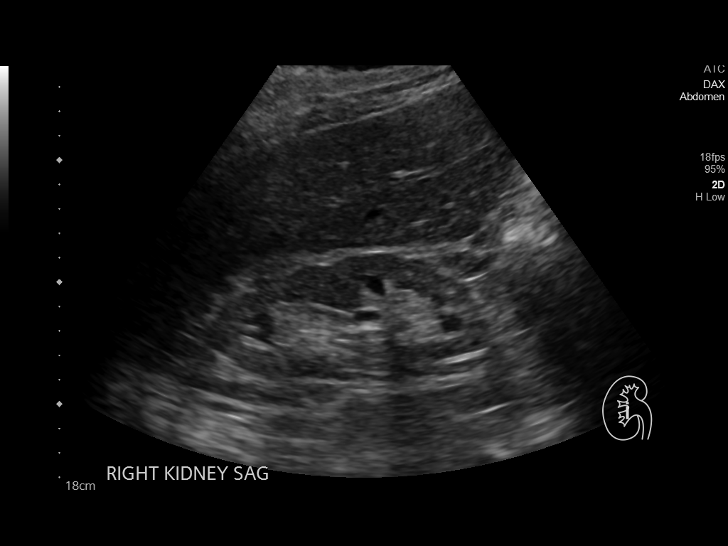
[im 3/25]
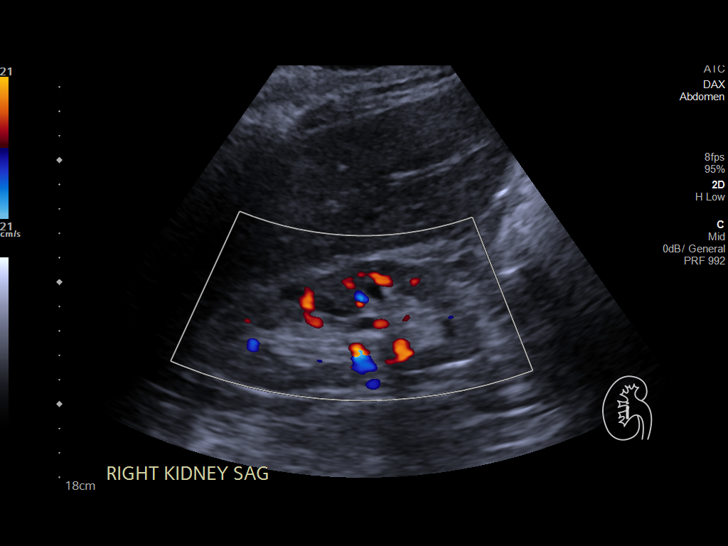
[im 5/25]
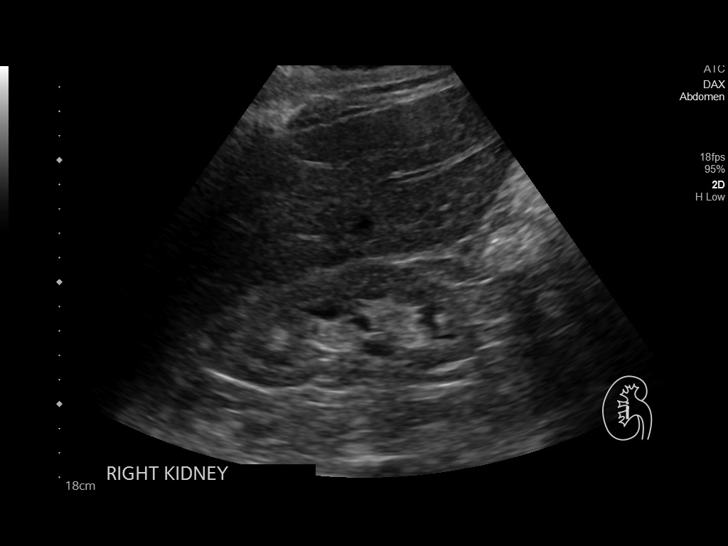
[im 7/25]
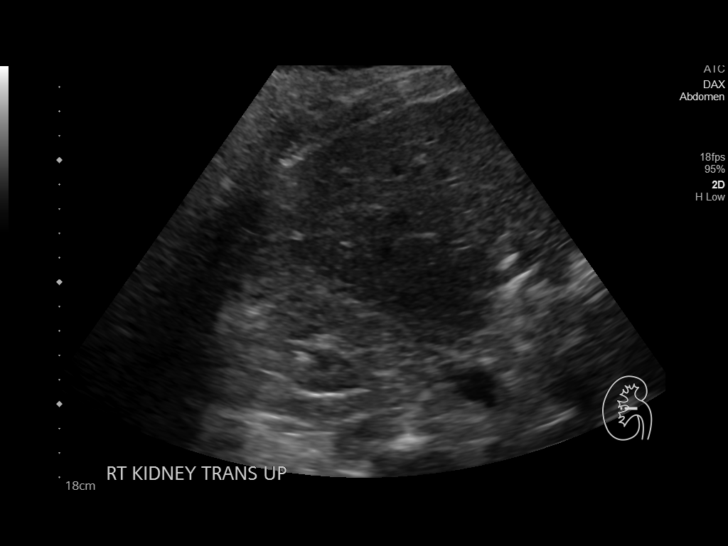
[im 9/25]
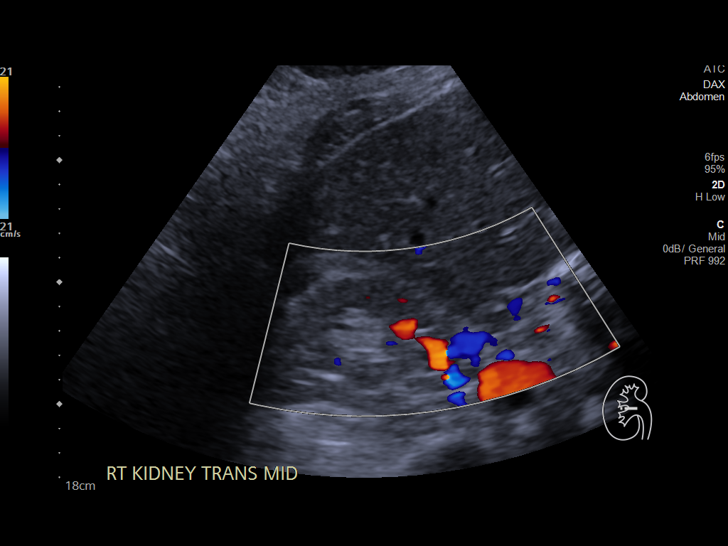
[im 10/25]
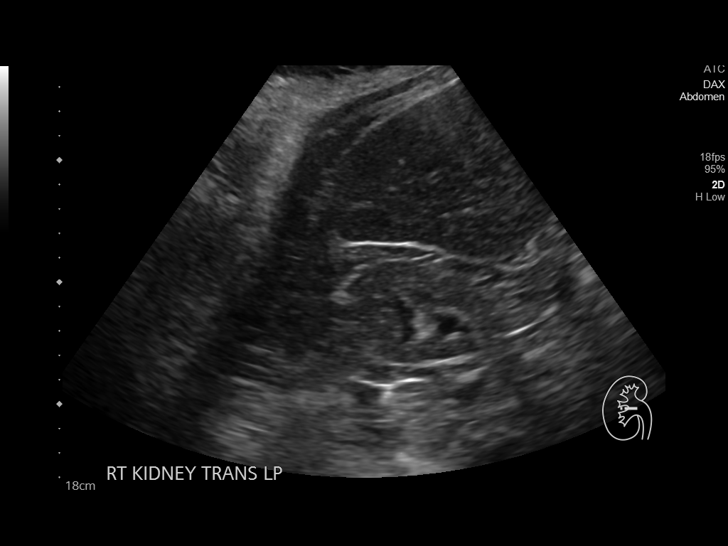
[im 12/25]
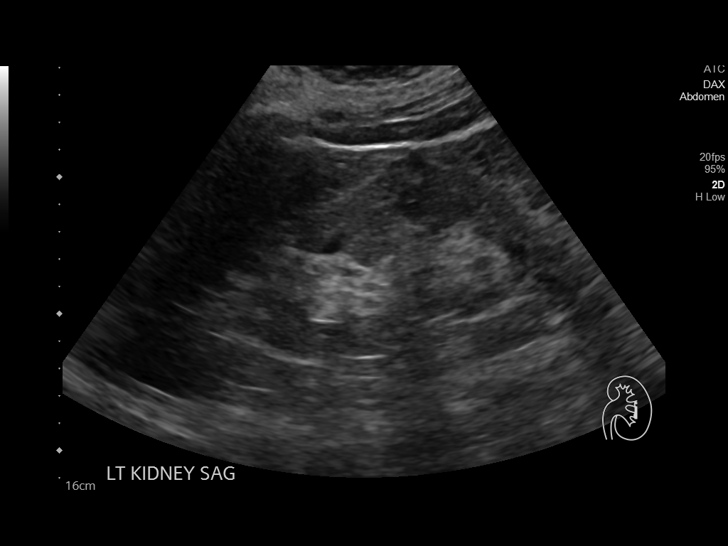
[im 14/25]
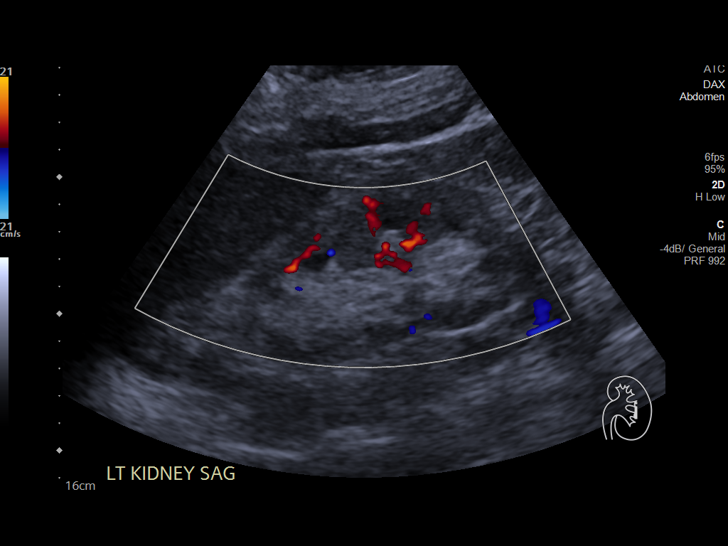
[im 16/25]
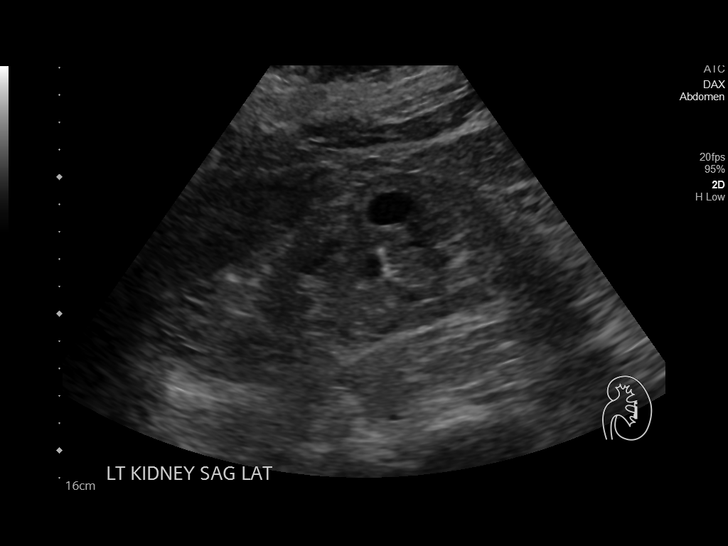
[im 17/25]
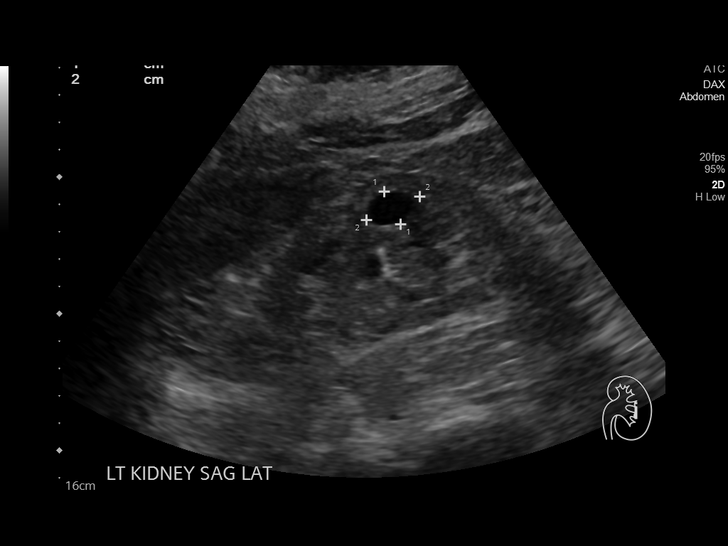
[im 19/25]
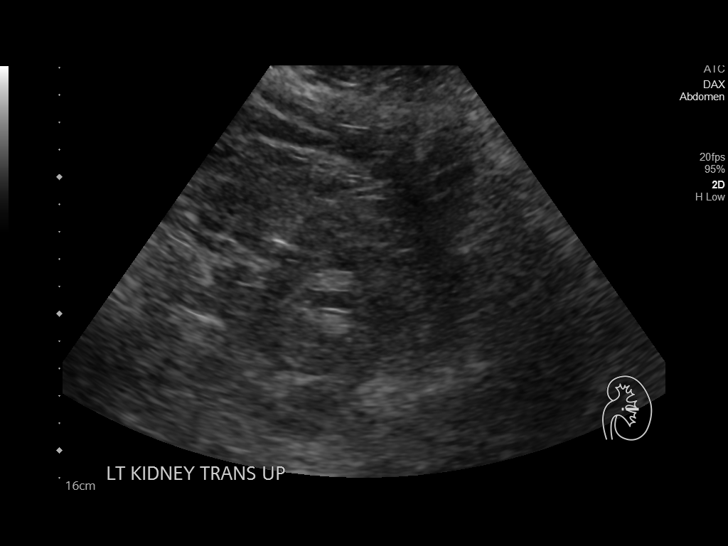
[im 21/25]
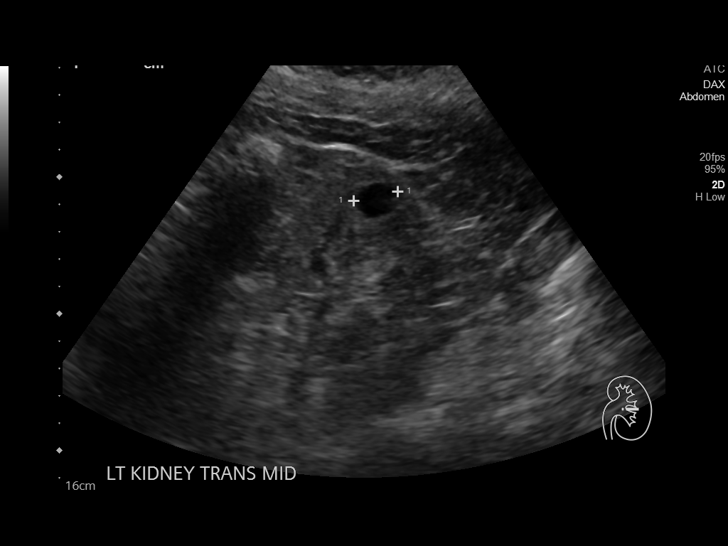
[im 23/25]
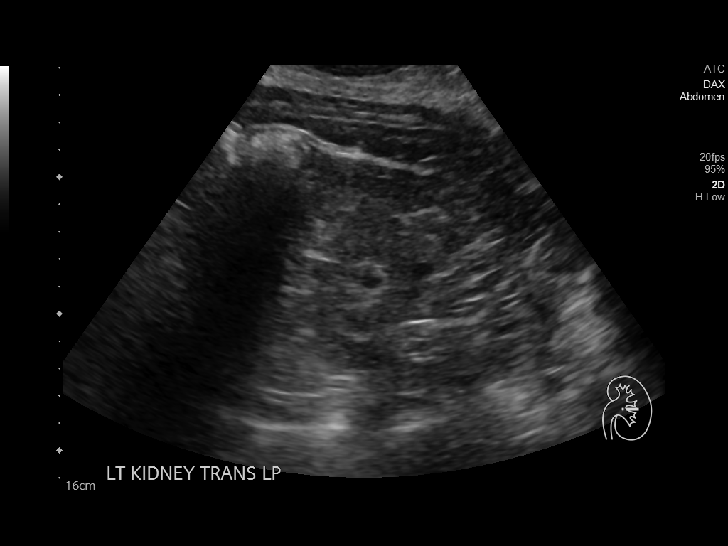
[im 25/25]
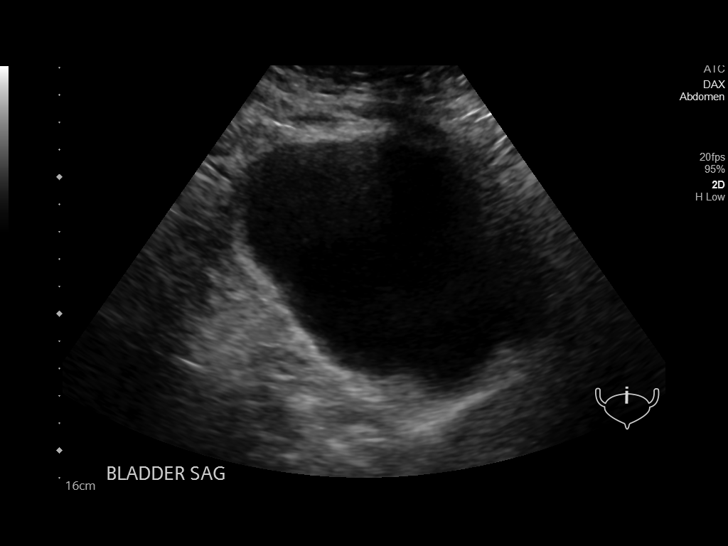

[14 of 25 positions shown; findings below may reference images not displayed]

FINDINGS: Right Kidney:

Length: 12.0 cm. Diffusely increased echotexture. No mass or
hydronephrosis.

Left Kidney:

Length: 11.7 cm. Diffusely increased echotexture. No hydronephrosis.
17 mm cyst in the midpole.

Bladder:

Appears normal for degree of bladder distention.
IMPRESSION: Increased echotexture bilaterally compatible with chronic medical
renal disease.

No hydronephrosis.

## 2020-04-15 ENCOUNTER — Telehealth: Payer: Self-pay

## 2020-04-15 ENCOUNTER — Encounter: Payer: Self-pay | Admitting: Physician Assistant

## 2020-04-15 DIAGNOSIS — N186 End stage renal disease: Secondary | ICD-10-CM

## 2020-04-15 DIAGNOSIS — E1100 Type 2 diabetes mellitus with hyperosmolarity without nonketotic hyperglycemic-hyperosmolar coma (NKHHC): Secondary | ICD-10-CM

## 2020-04-15 DIAGNOSIS — E1122 Type 2 diabetes mellitus with diabetic chronic kidney disease: Secondary | ICD-10-CM

## 2020-04-15 NOTE — Telephone Encounter (Signed)
Copied from [place] [id number]. Topic: General - Other >> Apr 15, 2020  2:45 PM Keene Breath wrote: Reason for CRM: Patient's daughter called to ask if she could bring patient into the office today regarding the message she sent on My Chart.  She needs to know before the office closes and said the doctor would know what she was talking about.  Please advise and call daughter to let her know at [phone]

## 2020-04-15 NOTE — Telephone Encounter (Signed)
Patient contacted via phone.

## 2020-04-15 NOTE — Telephone Encounter (Signed)
Responded to other mychart message.

## 2020-04-15 NOTE — Telephone Encounter (Signed)
Per provider she needs to go to the ER for possible stroke. Patient's daughter was advised.

## 2020-04-16 DIAGNOSIS — R279 Unspecified lack of coordination: Secondary | ICD-10-CM | POA: Diagnosis not present

## 2020-04-16 DIAGNOSIS — R5381 Other malaise: Secondary | ICD-10-CM | POA: Diagnosis not present

## 2020-04-16 DIAGNOSIS — Z743 Need for continuous supervision: Secondary | ICD-10-CM | POA: Diagnosis not present

## 2020-04-18 ENCOUNTER — Other Ambulatory Visit: Payer: Self-pay | Admitting: Physician Assistant

## 2020-04-18 DIAGNOSIS — E559 Vitamin D deficiency, unspecified: Secondary | ICD-10-CM

## 2020-04-18 DIAGNOSIS — E1140 Type 2 diabetes mellitus with diabetic neuropathy, unspecified: Secondary | ICD-10-CM

## 2020-04-18 DIAGNOSIS — I5032 Chronic diastolic (congestive) heart failure: Secondary | ICD-10-CM

## 2020-04-18 DIAGNOSIS — Z794 Long term (current) use of insulin: Secondary | ICD-10-CM

## 2020-04-18 NOTE — Telephone Encounter (Signed)
Requested medication (s) are due for refill today: no  Requested medication (s) are on the active medication list: yes  Last refill:  03/25/2020  Future visit scheduled:yes  Notes to clinic:  this refill cannot be delegated    Requested Prescriptions  Pending Prescriptions Disp Refills   Vitamin D, Ergocalciferol, (DRISDOL) 1.25 MG (50000 UNIT) CAPS capsule [Pharmacy Med Name: VIT D2 ERGOCAL (50,000 UNIT)] 1 capsule 11    Sig: TAKE [place] A MONTH      Endocrinology:  Vitamins - Vitamin D Supplementation Failed - 04/18/2020  3:37 PM      Failed - 50,000 IU strengths are not delegated      Failed - Ca in normal range and within 360 days    Calcium  Date Value Ref Range Status  01/31/2020 8.0 (L) 8.9 - 10.3 mg/dL Final   Calcium, Ion  Date Value Ref Range Status  09/14/2015 1.00 (L) 1.12 - 1.23 mmol/L Final          Failed - Phosphate in normal range and within 360 days    Phosphorus  Date Value Ref Range Status  01/31/2020 5.1 (H) 2.5 - 4.6 mg/dL Final          Failed - Vitamin D in normal range and within 360 days    No results found for: [id number], [id number], [id number], 25OHVITD3, 25OHVITD2, 25OHVITD3, 25OHVITD2, 25OHVITD1, 25OHVITD2, 25OHVITD3, VD25OH        Passed - Valid encounter within last 12 months    Recent Outpatient Visits           4 weeks ago Mouth pain   [place], Clearnce Sorrel, [place]   2 months ago [place], [place], [place]   5 months ago Diabetes mellitus type 2, uncontrolled, with complications [facility])   [place], [place], [place]   8 months ago Chest pain, unspecified type   [facility], [place], [place]   11 months ago Acute right-sided low back pain without sciatica   [facility], Vickki Muff, [place]       Future Appointments             In 2 months Burnette, Clearnce Sorrel, PA-C [place], [facility]              pregabalin (LYRICA) 150 MG capsule [Pharmacy Med Name: PREGABALIN 150 MG CAPSULE] 56 capsule 0    Sig: TAKE (1) CAPSULE BY MOUTH TWICE DAILY.      Not Delegated - Neurology:  Anticonvulsants - Controlled Failed - 04/18/2020  3:37 PM      Failed - This refill cannot be delegated      Passed - Valid encounter within last 12 months    Recent Outpatient Visits           4 weeks ago Mouth pain   [place], Clearnce Sorrel, [place]   2 months ago [place], [place], [place]   5 months ago Diabetes mellitus type 2, uncontrolled, with complications [facility])   [place], [place]   8 months ago Chest pain, unspecified type   [place], [place]   11 months ago Acute right-sided low back pain without sciatica   [place], Vickki Muff, [place]       Future Appointments  In 2 months Burnette, Clearnce Sorrel, PA-C [employer], [facility]             Signed Prescriptions Disp Refills   torsemide (DEMADEX) 20 MG tablet 112 tablet 0    Sig: TAKE (2) TABLETS BY MOUTH TWICE DAILY.      Cardiovascular:  Diuretics - Loop Failed - 04/18/2020  3:37 PM      Failed - Ca in normal range and within 360 days    Calcium  Date Value Ref Range Status  01/31/2020 8.0 (L) 8.9 - 10.3 mg/dL Final   Calcium, Ion  Date Value Ref Range Status  09/14/2015 1.00 (L) 1.12 - 1.23 mmol/L Final          Failed - Na in normal range and within 360 days    Sodium  Date Value Ref Range Status  01/31/2020 132 (L) 135 - 145 mmol/L Final  11/06/2019 138 134 - 144 mmol/L Final          Failed - Cr in normal range and within 360 days    Creat  Date Value Ref Range Status  09/23/2016 1.36 (H) 0.50 - 1.05 mg/dL Final    Comment:      For patients > or = 62 years of age: The upper reference limit for Creatinine is  approximately 13% higher for people identified as African-American.      Creatinine, Ser  Date Value Ref Range Status  01/31/2020 7.88 (H) 0.44 - 1.00 mg/dL Final   Creatinine, Urine  Date Value Ref Range Status  03/19/2017 94.44 mg/dL Final  03/19/2017 95.79 mg/dL Final          Failed - Last BP in normal range    BP Readings from Last 1 Encounters:  03/21/20 (!) 145/79          Passed - K in normal range and within 360 days    Potassium  Date Value Ref Range Status  01/31/2020 4.2 3.5 - 5.1 mmol/L Final   Potassium [facility] vascular lab)  Date Value Ref Range Status  09/19/2019 3.5 3.5 - 5.1 Final    Comment:    Performed at [facility], [address] [place], [place] [address]          [place] encounter within last 6 months    Recent Outpatient Visits           4 weeks ago Mouth pain   [place], Clearnce Sorrel, [place]   2 months ago [place], [place], [place]   5 months ago Diabetes mellitus type 2, uncontrolled, with complications [facility])   [place], [place]   8 months ago Chest pain, unspecified type   [place], [place]   11 months ago Acute right-sided low back pain without sciatica   [place], [place]       Future Appointments             In 2 months Burnette, Clearnce Sorrel, PA-C [employer], [place]

## 2020-04-22 MED ORDER — FREESTYLE LIBRE SENSOR SYSTEM MISC: 4 refills | Status: DC

## 2020-04-22 MED ORDER — FREESTYLE LIBRE READER DEVI: 0 refills | Status: DC

## 2020-04-22 NOTE — Addendum Note (Signed)
Addended by: [name] on: 04/22/2020 07:19 PM   Modules accepted: Orders

## 2020-04-23 ENCOUNTER — Other Ambulatory Visit: Payer: Self-pay | Admitting: Physician Assistant

## 2020-04-23 DIAGNOSIS — E559 Vitamin D deficiency, unspecified: Secondary | ICD-10-CM

## 2020-04-23 NOTE — Telephone Encounter (Signed)
Requested medication (s) are due for refill today: yes  Requested medication (s) are on the active medication list: yes  Last refill:  9//13/21  Future visit scheduled: yes  Notes to clinic:  not delegated    Requested Prescriptions  Pending Prescriptions Disp Refills   Vitamin D, Ergocalciferol, (DRISDOL) 1.25 MG (50000 UNIT) CAPS capsule [Pharmacy Med Name: VIT D2 ERGOCAL (50,000 UNIT)] 1 capsule 11    Sig: TAKE 1 CAPSULE BY MOUTH ONCE A MONTH      Endocrinology:  Vitamins - Vitamin D Supplementation Failed - 04/23/2020  8:48 AM      Failed - 50,000 IU strengths are not delegated      Failed - Ca in normal range and within 360 days    Calcium  Date Value Ref Range Status  01/31/2020 8.0 (L) 8.9 - 10.3 mg/dL Final   Calcium, Ion  Date Value Ref Range Status  09/14/2015 1.00 (L) 1.12 - 1.23 mmol/L Final          Failed - Phosphate in normal range and within 360 days    Phosphorus  Date Value Ref Range Status  01/31/2020 5.1 (H) 2.5 - 4.6 mg/dL Final          Failed - Vitamin D in normal range and within 360 days    No results found for: [id number], [id number], [id number], 25OHVITD3, 25OHVITD2, [place], 25OHVITD2, 25OHVITD1, 25OHVITD2, 25OHVITD3, VD25OH        Passed - Valid encounter within last 12 months    Recent Outpatient Visits           1 month ago Mouth pain   [facility] [name], [place]   2 months ago [place], [place], [place]   5 months ago Diabetes mellitus type 2, uncontrolled, with complications [facility])   [place], [place], PA-C   8 months ago Chest pain, unspecified type   [facility], [place], [place]   11 months ago Acute right-sided low back pain without sciatica   [facility], Vickki Muff, [place]       Future Appointments             In 2 months Burnette, Clearnce Sorrel, PA-C [employer], [facility]              Signed Prescriptions Disp Refills   atorvastatin (LIPITOR) 80 MG tablet 28 tablet 11    Sig: TAKE 1 TABLET BY MOUTH ONCE DAILY AT 6PM      Cardiovascular:  Antilipid - Statins Failed - 04/23/2020  8:48 AM      Failed - Total Cholesterol in normal range and within 360 days    Cholesterol  Date Value Ref Range Status  09/13/2016 122 0 - 200 mg/dL Final          Failed - LDL in normal range and within 360 days    LDL Cholesterol  Date Value Ref Range Status  09/13/2016 52 0 - 99 mg/dL Final    Comment:           Total Cholesterol/HDL:CHD Risk Coronary Heart Disease Risk Table                     Men   Women  1/2 Average Risk   3.4   3.3  Average Risk       5.0   4.4  2 X Average Risk   9.6  7.1  3 X Average Risk  23.4   11.0        Use the calculated Patient Ratio above and the CHD Risk Table to determine the patient's CHD Risk.        ATP III CLASSIFICATION (LDL):  <100     mg/dL   Optimal  100-129  mg/dL   Near or Above                    Optimal  130-159  mg/dL   Borderline  160-189  mg/dL   High  >190     mg/dL   Very High           Failed - HDL in normal range and within 360 days    HDL  Date Value Ref Range Status  09/13/2016 36 (L) >40 mg/dL Final          Failed - Triglycerides in normal range and within 360 days    Triglycerides  Date Value Ref Range Status  09/13/2016 168 (H) <150 mg/dL Final          Passed - Patient is not pregnant      Passed - Valid encounter within last 12 months    Recent Outpatient Visits           1 month ago Mouth pain   [place], Clearnce Sorrel, [place]   2 months ago [place], [place]   5 months ago Diabetes mellitus type 2, uncontrolled, with complications [facility])   [place], [place]   8 months ago Chest pain, unspecified type   [place], [place]   11 months  ago Acute right-sided low back pain without sciatica   [place], [place]       Future Appointments             In 2 months Burnette, Clearnce Sorrel, PA-C [employer], [place]

## 2020-04-23 NOTE — Telephone Encounter (Signed)
Requested Prescriptions  Pending Prescriptions Disp Refills  . atorvastatin (LIPITOR) 80 MG tablet [Pharmacy Med Name: ATORVASTATIN 80 MG TABLET] 28 tablet 11    Sig: TAKE 1 TABLET BY MOUTH ONCE DAILY AT 6PM     Cardiovascular:  Antilipid - Statins Failed - 04/23/2020  8:48 AM      Failed - Total Cholesterol in normal range and within 360 days    Cholesterol  Date Value Ref Range Status  09/13/2016 122 0 - 200 mg/dL Final         Failed - LDL in normal range and within 360 days    LDL Cholesterol  Date Value Ref Range Status  09/13/2016 52 0 - 99 mg/dL Final    Comment:           Total Cholesterol/HDL:CHD Risk Coronary Heart Disease Risk Table                     Men   Women  1/2 Average Risk   3.4   3.3  Average Risk       5.0   4.4  2 X Average Risk   9.6   7.1  3 X Average Risk  23.4   11.0        Use the calculated Patient Ratio above and the CHD Risk Table to determine the patient's CHD Risk.        ATP III CLASSIFICATION (LDL):  <100     mg/dL   Optimal  100-129  mg/dL   Near or Above                    Optimal  130-159  mg/dL   Borderline  160-189  mg/dL   High  >190     mg/dL   Very High          Failed - HDL in normal range and within 360 days    HDL  Date Value Ref Range Status  09/13/2016 36 (L) >40 mg/dL Final         Failed - Triglycerides in normal range and within 360 days    Triglycerides  Date Value Ref Range Status  09/13/2016 168 (H) <150 mg/dL Final         Passed - Patient is not pregnant      Passed - Valid encounter within last 12 months    Recent Outpatient Visits          1 month ago Mouth pain   [place], Clearnce Sorrel, [place]   2 months ago [place], [place]   5 months ago Diabetes mellitus type 2, uncontrolled, with complications [facility])   [place], [place]   8 months ago Chest pain, unspecified type   [place], [place]   11 months ago Acute right-sided low back pain without sciatica   [place], [place]      Future Appointments            In 2 months Burnette, Clearnce Sorrel, PA-C [employer], [place]           . Vitamin D, Ergocalciferol, (DRISDOL) 1.25 MG (50000 UNIT) CAPS capsule [Pharmacy Med Name: VIT D2 ERGOCAL (50,000 UNIT)] 1 capsule 11    Sig: TAKE 1 CAPSULE BY MOUTH ONCE A MONTH     Endocrinology:  Vitamins - Vitamin D Supplementation  Failed - 04/23/2020  8:48 AM      Failed - 50,000 IU strengths are not delegated      Failed - Ca in normal range and within 360 days    Calcium  Date Value Ref Range Status  01/31/2020 8.0 (L) 8.9 - 10.3 mg/dL Final   Calcium, Ion  Date Value Ref Range Status  09/14/2015 1.00 (L) 1.12 - 1.23 mmol/L Final         Failed - Phosphate in normal range and within 360 days    Phosphorus  Date Value Ref Range Status  01/31/2020 5.1 (H) 2.5 - 4.6 mg/dL Final         Failed - Vitamin D in normal range and within 360 days    No results found for: [id number], [id number], [id number], 25OHVITD3, 25OHVITD2, 25OHVITD3, 25OHVITD2, 25OHVITD1, 25OHVITD2, 25OHVITD3, VD25OH       Passed - Valid encounter within last 12 months    Recent Outpatient Visits          1 month ago Mouth pain   [place], Clearnce Sorrel, [place]   2 months ago [place], [place], [place]   5 months ago Diabetes mellitus type 2, uncontrolled, with complications [facility])   [place], [place], [place]   8 months ago Chest pain, unspecified type   [place], [place]   11 months ago Acute right-sided low back pain without sciatica   [place], [place]      Future Appointments            In 2 months Burnette, Clearnce Sorrel, PA-C [employer], [place]

## 2020-05-01 ENCOUNTER — Other Ambulatory Visit: Payer: Self-pay | Admitting: Physician Assistant

## 2020-05-01 DIAGNOSIS — E1165 Type 2 diabetes mellitus with hyperglycemia: Secondary | ICD-10-CM

## 2020-05-01 DIAGNOSIS — IMO0002 Reserved for concepts with insufficient information to code with codable children: Secondary | ICD-10-CM

## 2020-05-02 ENCOUNTER — Telehealth: Payer: Self-pay

## 2020-05-02 NOTE — Telephone Encounter (Signed)
Copied from [place] [id number]. Topic: Quick Communication - See Telephone Encounter >> May 02, 2020  3:35 PM [place] wrote: CRM for notification. See Telephone encounter for: 05/02/20.[employer] calling to make aware that pt needs to be scheduled for her AWV reach out at [phone]

## 2020-05-03 ENCOUNTER — Other Ambulatory Visit: Payer: Self-pay | Admitting: Physician Assistant

## 2020-05-03 ENCOUNTER — Encounter: Payer: Self-pay | Admitting: Physician Assistant

## 2020-05-03 DIAGNOSIS — E1165 Type 2 diabetes mellitus with hyperglycemia: Secondary | ICD-10-CM

## 2020-05-03 DIAGNOSIS — IMO0002 Reserved for concepts with insufficient information to code with codable children: Secondary | ICD-10-CM

## 2020-05-03 DIAGNOSIS — L299 Pruritus, unspecified: Secondary | ICD-10-CM

## 2020-05-03 DIAGNOSIS — I5032 Chronic diastolic (congestive) heart failure: Secondary | ICD-10-CM

## 2020-05-03 DIAGNOSIS — Z6841 Body Mass Index (BMI) 40.0 and over, adult: Secondary | ICD-10-CM

## 2020-05-03 DIAGNOSIS — R1312 Dysphagia, oropharyngeal phase: Secondary | ICD-10-CM

## 2020-05-03 DIAGNOSIS — I10 Essential (primary) hypertension: Secondary | ICD-10-CM

## 2020-05-03 DIAGNOSIS — A6000 Herpesviral infection of urogenital system, unspecified: Secondary | ICD-10-CM

## 2020-05-03 DIAGNOSIS — K219 Gastro-esophageal reflux disease without esophagitis: Secondary | ICD-10-CM

## 2020-05-03 DIAGNOSIS — J301 Allergic rhinitis due to pollen: Secondary | ICD-10-CM

## 2020-05-03 DIAGNOSIS — J9601 Acute respiratory failure with hypoxia: Secondary | ICD-10-CM

## 2020-05-03 MED ORDER — LEVEMIR FLEXTOUCH 100 UNIT/ML ~~LOC~~ SOPN: 60.0000 [IU] | PEN_INJECTOR | Freq: Two times a day (BID) | SUBCUTANEOUS | 3 refills | Status: DC

## 2020-05-03 MED ORDER — VALACYCLOVIR HCL 500 MG PO TABS: ORAL_TABLET | ORAL | 3 refills | Status: DC

## 2020-05-03 MED ORDER — OMEPRAZOLE 20 MG PO CPDR: DELAYED_RELEASE_CAPSULE | ORAL | 3 refills | Status: DC

## 2020-05-03 MED ORDER — TORSEMIDE 20 MG PO TABS: ORAL_TABLET | ORAL | 3 refills | Status: DC

## 2020-05-03 MED ORDER — BUPROPION HCL ER (SR) 150 MG PO TB12: 150.0000 mg | ORAL_TABLET | Freq: Two times a day (BID) | ORAL | 3 refills | Status: DC

## 2020-05-03 MED ORDER — NITROGLYCERIN 0.4 MG SL SUBL: 0.4000 mg | SUBLINGUAL_TABLET | SUBLINGUAL | 12 refills | Status: DC | PRN

## 2020-05-03 MED ORDER — CARVEDILOL 25 MG PO TABS: ORAL_TABLET | ORAL | 3 refills | Status: DC

## 2020-05-03 MED ORDER — NOVOLOG FLEXPEN 100 UNIT/ML ~~LOC~~ SOPN: PEN_INJECTOR | SUBCUTANEOUS | 5 refills | Status: DC

## 2020-05-03 MED ORDER — MONTELUKAST SODIUM 10 MG PO TABS: 10.0000 mg | ORAL_TABLET | Freq: Every day | ORAL | 3 refills | Status: DC

## 2020-05-03 MED ORDER — HYDROXYZINE HCL 25 MG PO TABS: 25.0000 mg | ORAL_TABLET | Freq: Four times a day (QID) | ORAL | 3 refills | Status: DC

## 2020-05-03 MED ORDER — DOXEPIN HCL 10 MG PO CAPS: ORAL_CAPSULE | ORAL | 3 refills | Status: DC

## 2020-05-03 MED ORDER — AURYXIA 1 GM 210 MG(FE) PO TABS: 420.0000 mg | ORAL_TABLET | Freq: Three times a day (TID) | ORAL | 3 refills | Status: DC

## 2020-05-03 MED ORDER — HYDRALAZINE HCL 100 MG PO TABS: ORAL_TABLET | ORAL | 3 refills | Status: DC

## 2020-05-03 NOTE — Telephone Encounter (Signed)
[facility] faxed refill request for the following medications: [facility] [phone] - phone [phone] - fax  valACYclovir tablet buPROPion tablet   T/S ONE TOUCH VERIO STRIP carvedilol tablet hydrOXYzine HCL tablet LEVEMIR FLEXTOUCH NOVOLOG FLEXPEN INJ Torsemide tablet AURYXIA tablet nitroGLYCERIN SUB montelukast tablet omeprazole capsule doxepin HCL capsule hydrALAZINE  tablet    Please advise. Thanks, [employer]

## 2020-05-08 ENCOUNTER — Encounter (INDEPENDENT_AMBULATORY_CARE_PROVIDER_SITE_OTHER): Payer: Self-pay | Admitting: Nurse Practitioner

## 2020-05-08 ENCOUNTER — Ambulatory Visit (INDEPENDENT_AMBULATORY_CARE_PROVIDER_SITE_OTHER): Payer: Medicare Other | Admitting: Nurse Practitioner

## 2020-05-08 ENCOUNTER — Other Ambulatory Visit: Payer: Self-pay

## 2020-05-08 ENCOUNTER — Ambulatory Visit (INDEPENDENT_AMBULATORY_CARE_PROVIDER_SITE_OTHER): Payer: Medicare Other

## 2020-05-08 VITALS — BP 147/81 | HR 78 | Resp 16 | Wt 293.0 lb

## 2020-05-08 DIAGNOSIS — I10 Essential (primary) hypertension: Secondary | ICD-10-CM | POA: Diagnosis not present

## 2020-05-08 DIAGNOSIS — N186 End stage renal disease: Secondary | ICD-10-CM

## 2020-05-08 DIAGNOSIS — E785 Hyperlipidemia, unspecified: Secondary | ICD-10-CM

## 2020-05-08 DIAGNOSIS — T829XXS Unspecified complication of cardiac and vascular prosthetic device, implant and graft, sequela: Secondary | ICD-10-CM | POA: Diagnosis not present

## 2020-05-08 NOTE — Progress Notes (Signed)
Subjective:    Patient ID: [name], female    DOB: Mar 17, 1956, 64 y.o.   MRN: [id number] Chief Complaint  Patient presents with  . Follow-up    ref Moshe Cipro HDA    The patient returns to the office for followup of their dialysis access. The function of the access has been stable. The patient denies increased bleeding time or increased recirculation. Patient denies difficulty with cannulation. The patient denies hand pain or other symptoms consistent with steal phenomena.  No significant arm swelling.  Patient does note on one instance where she had extensive bleeding following her dialysis treatment however the patient notes that this was an isolated incident.  The patient denies redness or swelling at the access site. The patient denies fever or chills at home or while on dialysis.  The patient denies amaurosis fugax or recent TIA symptoms. There are no recent neurological changes noted. The patient denies claudication symptoms or rest pain symptoms. The patient denies history of DVT, PE or superficial thrombophlebitis. The patient denies recent episodes of angina or shortness of breath.     Patient has a flow volume of 1274.  The AV fistula is patent.  There is elevated velocities near the proximal upper arm however they are not hemodynamically significant.   Review of Systems  Hematological: Bruises/bleeds easily.  All other systems reviewed and are negative.      Objective:   Physical Exam Vitals reviewed.  HENT:     Head: Normocephalic.  Cardiovascular:     Rate and Rhythm: Normal rate.     Pulses: Normal pulses.     Arteriovenous access: left arteriovenous access is present.    Comments: Good thrill and bruit left basilic vein transposition Pulmonary:     Effort: Pulmonary effort is normal.  Skin:    General: Skin is warm and dry.  Neurological:     Mental Status: She is alert and oriented to person, place, and time.  Psychiatric:        Mood and  Affect: Mood normal.        Behavior: Behavior normal.        Thought Content: Thought content normal.        Judgment: Judgment normal.     BP (!) 147/81 (BP Location: Right Arm)   Pulse 78   Resp 16   Wt 293 lb (132.9 kg)   BMI 44.55 kg/m   Past Medical History:  Diagnosis Date  . Anemia   . Aortic stenosis    Echo 8/18: mean 13, peak 28, LVOT/AV mean velocity 0.51  . Arthritis   . Asthma    As a child   . Bronchitis   . CAD (coronary artery disease)    a. 09/2016: 50% Ost 1st Mrg stenosis, 50% 2nd Mrg stenosis, 20% Mid-Cx, 95% Prox LAD, 40% mid-LAD, and 10% dist-LAD stenosis. Staged PCI with DES to Prox-LAD.   [name] Chronic combined systolic and diastolic CHF (congestive heart failure) ([place]) 2011   echo 2/18: EF 55-60, normal wall motion, grade 2 diastolic dysfunction, trivial AI // echo 3/18: Septal and apical HK, EF 45-50, normal wall motion, trivial AI, mild LAE, PASP 38 // echo 8/18: EF 60-65, normal wall motion, grade 1 diastolic dysfunction, calcified aortic valve leaflets, mild aortic stenosis (mean 13, peak 28, LVOT/AV mean velocity 0.51), mild AI, moderate MAC, mild LAE, trivial TR   . Chronic kidney disease    STAGE 4  . Chronic kidney disease on chronic dialysis (  [place])    t, th, sat  . Complication of anesthesia   . Depression   . Diabetes mellitus Dx 1989  . Elevated lipids   . GERD (gastroesophageal reflux disease)   . Gout   . Heart murmur    asymptomatic  . Hepatitis C Dx 2013  . Hypertension Dx 1989  . Infected surgical wound    Lt arm  . Myocardial infarction ([place]) 07/2015  . Obesity   . Pancreatitis 2013  . Pneumonia   . Refusal of blood transfusions as patient is Jehovah's Witness   . Tendinitis   . Tremors of nervous system    LEFT HAND  . Ulcer 2010    Social History   Socioeconomic History  . Marital status: Divorced    Spouse name: Not on file  . Number of children: 3  . Years of education: Not on file  . Highest education level:  Bachelor's degree (e.g., BA, AB, BS)  Occupational History  . Occupation: disability  Tobacco Use  . Smoking status: Former Smoker    Packs/day: 0.25    Years: 6.00    Pack years: 1.50    Types: Cigarettes    Quit date: 10/25/1980    Years since quitting: 39.5  . Smokeless tobacco: Never Used  Vaping Use  . Vaping Use: Never used  Substance and Sexual Activity  . Alcohol use: Not Currently  . Drug use: Yes    Types: Marijuana    Comment: Occasional marijuana use 08/21/2016 "no crack-clean since 05/1998"  . Sexual activity: Not on file    Comment: Not asked  Other Topics Concern  . Not on file  Social History Narrative  . Not on file   Social Determinants of Health   Financial Resource Strain: Low Risk   . Difficulty of Paying Living Expenses: Not hard at all  Food Insecurity: No Food Insecurity  . Worried About Charity fundraiser in the Last Year: Never true  . Ran Out of Food in the Last Year: Never true  Transportation Needs: No Transportation Needs  . Lack of Transportation (Medical): No  . Lack of Transportation (Non-Medical): No  Physical Activity: Inactive  . Days of Exercise per Week: 0 days  . Minutes of Exercise per Session: 0 min  Stress: Stress Concern Present  . Feeling of Stress : To some extent  Social Connections: Socially Isolated  . Frequency of Communication with Friends and Family: Twice a week  . Frequency of Social Gatherings with Friends and Family: Never  . Attends Religious Services: 1 to 4 times per year  . Active Member of Clubs or Organizations: No  . Attends Archivist Meetings: Never  . Marital Status: Divorced  Human resources officer Violence: Not At Risk  . Fear of Current or Ex-Partner: No  . Emotionally Abused: No  . Physically Abused: No  . Sexually Abused: No    Past Surgical History:  Procedure Laterality Date  . A/V FISTULAGRAM Left 04/11/2019   Procedure: A/V FISTULAGRAM;  Surgeon: Katha Cabal, MD;  Location: [place];  Service: Cardiovascular;  Laterality: Left;  . A/V FISTULAGRAM Left 06/02/2019   Procedure: A/V FISTULAGRAM;  Surgeon: Katha Cabal, MD;  Location: [place] [facility];  Service: Cardiovascular;  Laterality: Left;  . APPLICATION OF WOUND VAC Left 08/14/2017   Procedure: APPLICATION OF WOUND VAC Exchange;  Surgeon: Robert Bellow, MD;  Location: [facility];  Service: General;  Laterality: Left;  .  APPLICATION OF WOUND VAC Left 12/21/2018   Procedure: APPLICATION OF WOUND VAC;  Surgeon: Katha Cabal, MD;  Location: [facility];  Service: Vascular;  Laterality: Left;  . AV FISTULA PLACEMENT Left 08/19/2018   Procedure: ARTERIOVENOUS (AV) FISTULA CREATION ( BRACHIOBASILIC );  Surgeon: Katha Cabal, MD;  Location: [facility];  Service: Vascular;  Laterality: Left;  . BASCILIC VEIN TRANSPOSITION Left 11/18/2018   Procedure: BASCILIC VEIN TRANSPOSITION;  Surgeon: Katha Cabal, MD;  Location: [facility];  Service: Vascular;  Laterality: Left;  . CHOLECYSTECTOMY    . COLONOSCOPY WITH PROPOFOL N/A 02/03/2018   Procedure: COLONOSCOPY WITH PROPOFOL;  Surgeon: Lin Landsman, MD;  Location: [facility];  Service: Gastroenterology;  Laterality: N/A;  . CORONARY ANGIOPLASTY  07/2015   STENT  . CORONARY STENT INTERVENTION N/A 09/18/2016   Procedure: Coronary Stent Intervention;  Surgeon: Troy Sine, MD;  Location: [place] [facility];  Service: Cardiovascular;  Laterality: N/A;  . DIALYSIS/PERMA CATHETER INSERTION N/A 05/10/2018   Procedure: DIALYSIS/PERMA CATHETER INSERTION;  Surgeon: Katha Cabal, MD;  Location: [place] [facility];  Service: Cardiovascular;  Laterality: N/A;  . DRESSING CHANGE UNDER ANESTHESIA Left 08/15/2017   Procedure: exploration of wound for bleeding;  Surgeon: Robert Bellow, MD;  Location: [facility];  Service: General;  Laterality: Left;  . ESOPHAGOGASTRODUODENOSCOPY (EGD) WITH PROPOFOL N/A 02/03/2018   Procedure:  ESOPHAGOGASTRODUODENOSCOPY (EGD) WITH PROPOFOL;  Surgeon: Lin Landsman, MD;  Location: [facility];  Service: Gastroenterology;  Laterality: N/A;  . EYE SURGERY  11/17/2018  . INCISION AND DRAINAGE ABSCESS Left 08/12/2017   Procedure: INCISION AND DRAINAGE ABSCESS;  Surgeon: Robert Bellow, MD;  Location: [facility];  Service: General;  Laterality: Left;  . KNEE ARTHROSCOPY    . LEFT HEART CATH N/A 09/18/2016   Procedure: Left Heart Cath;  Surgeon: Troy Sine, MD;  Location: [place] [facility];  Service: Cardiovascular;  Laterality: N/A;  . LEFT HEART CATH AND CORONARY ANGIOGRAPHY N/A 09/16/2016   Procedure: Left Heart Cath and Coronary Angiography;  Surgeon: Burnell Blanks, MD;  Location: [place] [facility];  Service: Cardiovascular;  Laterality: N/A;  . LEFT HEART CATH AND CORONARY ANGIOGRAPHY N/A 04/29/2017   Procedure: LEFT HEART CATH AND CORONARY ANGIOGRAPHY;  Surgeon: Nelva Bush, MD;  Location: [place] [facility];  Service: Cardiovascular;  Laterality: N/A;  . LOWER EXTREMITY ANGIOGRAPHY Right 03/08/2018   Procedure: LOWER EXTREMITY ANGIOGRAPHY;  Surgeon: Katha Cabal, MD;  Location: [place] [facility];  Service: Cardiovascular;  Laterality: Right;  . TUBAL LIGATION    . TUBAL LIGATION    . UPPER EXTREMITY ANGIOGRAPHY Right 09/19/2019   Procedure: UPPER EXTREMITY ANGIOGRAPHY;  Surgeon: Katha Cabal, MD;  Location: [place] [facility];  Service: Cardiovascular;  Laterality: Right;  . WOUND DEBRIDEMENT Left 12/21/2018   Procedure: DEBRIDEMENT WOUND;  Surgeon: Katha Cabal, MD;  Location: [facility];  Service: Vascular;  Laterality: Left;  . WOUND DEBRIDEMENT Left 12/30/2018   Procedure: DEBRIDEMENT WOUND WITH VAC PLACEMENT (LEFT UPPER EXTREMITY);  Surgeon: Katha Cabal, MD;  Location: [facility];  Service: Vascular;  Laterality: Left;    Family History  Problem Relation Age of Onset  . Colon cancer Mother   . Heart attack Other   . Heart  attack Maternal Grandmother   . Hypertension Sister   . Hypertension Brother   . Diabetes Paternal Grandmother   . Breast cancer Neg Hx     Allergies  Allergen Reactions  . Shellfish Allergy Anaphylaxis and Swelling  . Diazepam Other (See Comments)    "felt like out of body experience"       Assessment & Plan:   1. ESRD (end stage renal disease) ([place]) Recommend:  The patient is doing well and currently has adequate dialysis access. The patient's dialysis center is not reporting any access issues. Flow pattern is stable when compared to the prior ultrasound.  The patient should have a duplex ultrasound of the dialysis access in 6 months. The patient will follow-up with me in the office after each ultrasound   Patient is also advised that if she begins to have recurrent bleeding episodes following dialysis we can proceed with fistulogram  Follow-up.  2. Essential hypertension Continue antihypertensive medications as already ordered, these medications have been reviewed and there are no changes at this time.   3. Hyperlipidemia, unspecified hyperlipidemia type Continue statin as ordered and reviewed, no changes at this time   Current Outpatient Medications on File Prior to Visit  Medication Sig Dispense Refill  . allopurinol (ZYLOPRIM) 100 MG tablet TAKE 1 TABLET BY MOUTH ONCE DAILY. 28 tablet 11  . aspirin EC 81 MG EC tablet Take 1 tablet (81 mg total) by mouth daily.    [name] atorvastatin (LIPITOR) 80 MG tablet TAKE 1 TABLET BY MOUTH ONCE DAILY AT 6PM 28 tablet 11  . AURYXIA 1 GM 210 MG(Fe) tablet Take 2 tablets (420 mg total) by mouth 3 (three) times daily with meals. 540 tablet 3  . b complex-vitamin c-folic acid (NEPHRO-VITE) 0.8 MG TABS tablet Take 1 tablet by mouth daily.     [name] buPROPion (WELLBUTRIN SR) 150 MG 12 hr tablet Take 1 tablet (150 mg total) by mouth 2 (two) times daily. 180 tablet 3  . carvedilol (COREG) 25 MG tablet TAKE (1) TABLET BY MOUTH TWICE A DAY WITH  MEALS (BREAKFAST AND SUPPER) 180 tablet 3  . Cinacalcet HCl (SENSIPAR PO) Take 30 mg by mouth every dialysis.    . Continuous Blood Gluc Receiver (FREESTYLE LIBRE READER) DEVI To check blood sugar ACHS 1 each 0  . Continuous Blood Gluc Sensor (FREESTYLE LIBRE SENSOR SYSTEM) MISC To check blood sugar ACHS; Change every 14 days 6 each 4  . cyclobenzaprine (FLEXERIL) 5 MG tablet Take 1 tablet (5 mg total) by mouth 3 (three) times daily as needed for muscle spasms. 30 tablet 5  . dicyclomine (BENTYL) 20 MG tablet TAKE 1 TABLET(20 MG) BY MOUTH FOUR TIMES DAILY BEFORE MEALS AND AT BEDTIME (Patient taking differently: Take 20 mg by mouth 4 (four) times daily -  before meals and at bedtime. ) 120 tablet 2  . docusate sodium (COLACE) 100 MG capsule Take 1 capsule (100 mg total) by mouth 2 (two) times daily as needed for mild constipation. 30 capsule 0  . doxepin (SINEQUAN) 10 MG capsule TAKE 1 CAPSULE(10 MG) BY MOUTH FOUR TIMES DAILY AS NEEDED FOR ITCHING 360 capsule 3  . DULERA 200-5 MCG/ACT AERO Inhale 2 puffs into the lungs 2 (two) times daily as needed.     . fluticasone (FLONASE) 50 MCG/ACT nasal spray Place 2 sprays into both nostrils daily as needed for allergies or rhinitis. 48 g 1  . fluticasone furoate-vilanterol (BREO ELLIPTA) 200-25 MCG/INH AEPB Inhale 1 puff into the lungs daily. (Patient taking differently: Inhale 1 puff into the lungs daily as needed. ) 60 each 11  . gentamicin cream (GARAMYCIN) 0.1 % Apply 1 application topically 2 (two) times  daily. (Patient taking differently: Apply 1 application topically 2 (two) times daily as needed. ) 15 g 1  . hydrALAZINE (APRESOLINE) 100 MG tablet TAKE (1) TABLET BY MOUTH THREE TIMES DAILY 270 tablet 3  . hydrOXYzine (ATARAX/VISTARIL) 25 MG tablet Take 1 tablet (25 mg total) by mouth 4 (four) times daily. 360 tablet 3  . insulin aspart (NOVOLOG FLEXPEN) 100 UNIT/ML FlexPen USE 25 UNITS UNDER THE SKIN THREE TIMES DAILY WITH MEALS 15 mL 5  . insulin  detemir (LEVEMIR FLEXTOUCH) 100 UNIT/ML FlexPen Inject 60 Units into the skin 2 (two) times daily. 15 mL 3  . lidocaine-prilocaine (EMLA) cream Apply 1 application topically as needed. 30 g 11  . LOPERAMIDE HCL PO Take 1 tablet by mouth as needed.     . loratadine (CLARITIN) 10 MG tablet TAKE 1 TABLET BY MOUTH ONCE DAILY. (Patient taking differently: Take 10 mg by mouth at bedtime. ) 28 tablet 11  . magic mouthwash w/lidocaine SOLN Take 5 mLs by mouth 3 (three) times daily as needed for mouth pain. 200 mL 0  . mometasone (ELOCON) 0.1 % cream APPLY AS DIRECTED TO AFFECTED AREA ONCE DAILY. (Patient taking differently: Apply 1 application topically daily as needed. ) 45 g 10  . montelukast (SINGULAIR) 10 MG tablet Take 1 tablet (10 mg total) by mouth at bedtime. 90 tablet 3  . nitroGLYCERIN (NITROSTAT) 0.4 MG SL tablet Place 1 tablet (0.4 mg total) under the tongue every 5 (five) minutes x 3 doses as needed for chest pain. 25 tablet 12  . Nutritional Supplements (FEEDING SUPPLEMENT, NEPRO CARB STEADY,) LIQD Take 237 mLs by mouth daily. 7110 mL 0  . nystatin cream (MYCOSTATIN) Apply 1 application topically 2 (two) times daily. (Patient taking differently: Apply 1 application topically daily as needed for dry skin. ) 30 g 0  . omeprazole (PRILOSEC) 20 MG capsule TAKE (1) CAPSULE BY MOUTH ONCE DAILY. 90 capsule 3  . pregabalin (LYRICA) 150 MG capsule TAKE 1 CAPSULE(150 MG) BY MOUTH TWICE DAILY 60 capsule 5  . SENNA PLUS 8.6-50 MG tablet TAKE ONE TABLET BY MOUTH AT BEDTIME. (Patient taking differently: Take 1 tablet by mouth at bedtime as needed. ) 28 tablet 11  . silver sulfADIAZINE (SILVADENE) 1 % cream Apply 1 application topically daily. Apply to right first toe (Patient taking differently: Apply 1 application topically daily as needed (pain). ) 50 g 0  . torsemide (DEMADEX) 20 MG tablet TAKE (2) TABLETS BY MOUTH TWICE DAILY. 360 tablet 3  . triamcinolone cream (KENALOG) 0.1 % APPLY TO AFFECTED AREA  TWICE DAILY (Patient taking differently: 2 (two) times daily as needed. ) 80 g 0  . valACYclovir (VALTREX) 500 MG tablet TAKE (1) TABLET BY MOUTH EVERY OTHER DAY. 45 tablet 3  . VICTOZA 18 MG/3ML SOPN Start with 0.90m daily and increase by 0.651mper week until max dose of 1.8 mg dose achieved. 9 mL 11  . Vitamin D, Ergocalciferol, (DRISDOL) 1.25 MG (50000 UNIT) CAPS capsule TAKE 1 CAPSULE BY MOUTH ONCE A MONTH 1 capsule 0  . albuterol (VENTOLIN HFA) 108 (90 Base) MCG/ACT inhaler Inhale 2 puffs into the lungs every 4 (four) hours as needed for wheezing or shortness of breath. 6.7 g 0  . nystatin (MYCOSTATIN) 100000 UNIT/ML suspension Take 5 mLs (500,000 Units total) by mouth 4 (four) times daily. (Patient not taking: Reported on 03/21/2020) 100 mL 0  . [DISCONTINUED] gabapentin (NEURONTIN) 100 MG capsule Take 1 capsule (100 mg total) by mouth 3 (  three) times daily. 90 capsule 1   No current facility-administered medications on file prior to visit.    There are no Patient Instructions on file for this visit. No follow-ups on file.   Kris Hartmann, NP

## 2020-05-10 ENCOUNTER — Other Ambulatory Visit: Payer: Self-pay | Admitting: Physician Assistant

## 2020-05-10 DIAGNOSIS — G8929 Other chronic pain: Secondary | ICD-10-CM

## 2020-05-10 DIAGNOSIS — E1142 Type 2 diabetes mellitus with diabetic polyneuropathy: Secondary | ICD-10-CM

## 2020-05-10 DIAGNOSIS — Z794 Long term (current) use of insulin: Secondary | ICD-10-CM

## 2020-05-14 ENCOUNTER — Other Ambulatory Visit: Payer: Self-pay | Admitting: Physician Assistant

## 2020-05-14 MED ORDER — INSULIN LISPRO (1 UNIT DIAL) 100 UNIT/ML (KWIKPEN): 25.0000 [IU] | PEN_INJECTOR | Freq: Three times a day (TID) | SUBCUTANEOUS | 11 refills | Status: DC

## 2020-05-14 NOTE — Progress Notes (Signed)
Novolog changed to Humalog per insurance coverage

## 2020-05-15 ENCOUNTER — Emergency Department (HOSPITAL_COMMUNITY)
Admission: EM | Admit: 2020-05-15 | Discharge: 2020-05-15 | Disposition: A | Discharge status: Home / Self Care | Payer: Medicare Other | Attending: Emergency Medicine | Admitting: Emergency Medicine

## 2020-05-15 ENCOUNTER — Emergency Department (HOSPITAL_COMMUNITY): Payer: Medicare Other

## 2020-05-15 ENCOUNTER — Other Ambulatory Visit: Payer: Self-pay

## 2020-05-15 ENCOUNTER — Encounter (HOSPITAL_COMMUNITY): Payer: Self-pay

## 2020-05-15 DIAGNOSIS — E1165 Type 2 diabetes mellitus with hyperglycemia: Secondary | ICD-10-CM | POA: Diagnosis not present

## 2020-05-15 DIAGNOSIS — I2510 Atherosclerotic heart disease of native coronary artery without angina pectoris: Secondary | ICD-10-CM | POA: Diagnosis not present

## 2020-05-15 DIAGNOSIS — I132 Hypertensive heart and chronic kidney disease with heart failure and with stage 5 chronic kidney disease, or end stage renal disease: Secondary | ICD-10-CM | POA: Insufficient documentation

## 2020-05-15 DIAGNOSIS — N186 End stage renal disease: Secondary | ICD-10-CM | POA: Insufficient documentation

## 2020-05-15 DIAGNOSIS — I5042 Chronic combined systolic (congestive) and diastolic (congestive) heart failure: Secondary | ICD-10-CM | POA: Insufficient documentation

## 2020-05-15 DIAGNOSIS — M79604 Pain in right leg: Secondary | ICD-10-CM

## 2020-05-15 DIAGNOSIS — J45909 Unspecified asthma, uncomplicated: Secondary | ICD-10-CM | POA: Diagnosis not present

## 2020-05-15 DIAGNOSIS — Z87891 Personal history of nicotine dependence: Secondary | ICD-10-CM | POA: Diagnosis not present

## 2020-05-15 DIAGNOSIS — E1122 Type 2 diabetes mellitus with diabetic chronic kidney disease: Secondary | ICD-10-CM | POA: Diagnosis not present

## 2020-05-15 DIAGNOSIS — Z79899 Other long term (current) drug therapy: Secondary | ICD-10-CM | POA: Diagnosis not present

## 2020-05-15 DIAGNOSIS — M79651 Pain in right thigh: Secondary | ICD-10-CM | POA: Diagnosis present

## 2020-05-15 DIAGNOSIS — Z794 Long term (current) use of insulin: Secondary | ICD-10-CM | POA: Diagnosis not present

## 2020-05-15 DIAGNOSIS — Z992 Dependence on renal dialysis: Secondary | ICD-10-CM | POA: Diagnosis not present

## 2020-05-15 DIAGNOSIS — Z7982 Long term (current) use of aspirin: Secondary | ICD-10-CM | POA: Diagnosis not present

## 2020-05-15 DIAGNOSIS — Z955 Presence of coronary angioplasty implant and graft: Secondary | ICD-10-CM | POA: Insufficient documentation

## 2020-05-15 NOTE — ED Provider Notes (Signed)
[facility] DEPT Provider Note   CSN: [id number] Arrival date & time: 05/15/20  1731     History Chief Complaint  Patient presents with  . Leg Pain    [name] is a 64 y.o. female.  HPI 64 year old female with an extensive medical history including anemia, CAD, CKD on dialysis T/TH/S, DM type II, GERD, hepatitis C, prior MI presents to the ER with acute pain to her right thigh which began on Saturday.  Patient denies any falls or known injuries.  States she has pain to the middle of her right thigh, feels like there is a "knot".  Pain is worsened with weightbearing.  She denies any numbness or tingling to the area.  States she has some difficulty walking secondary to the pain.  She states she took 2 of her 10/325 Percocets this morning with little relief.  States she feels like her leg is swollen.  Denies any redness, warmth the area.  No prior history of blood clots.  She is currently off Eliquis and is only taking aspirin.    Past Medical History:  Diagnosis Date  . Anemia   . Aortic stenosis    Echo 8/18: mean 13, peak 28, LVOT/AV mean velocity 0.51  . Arthritis   . Asthma    As a child   . Bronchitis   . CAD (coronary artery disease)    a. 09/2016: 50% Ost 1st Mrg stenosis, 50% 2nd Mrg stenosis, 20% Mid-Cx, 95% Prox LAD, 40% mid-LAD, and 10% dist-LAD stenosis. Staged PCI with DES to Prox-LAD.   [name] Chronic combined systolic and diastolic CHF (congestive heart failure) ([place]) 2011   echo 2/18: EF 55-60, normal wall motion, grade 2 diastolic dysfunction, trivial AI // echo 3/18: Septal and apical HK, EF 45-50, normal wall motion, trivial AI, mild LAE, PASP 38 // echo 8/18: EF 60-65, normal wall motion, grade 1 diastolic dysfunction, calcified aortic valve leaflets, mild aortic stenosis (mean 13, peak 28, LVOT/AV mean velocity 0.51), mild AI, moderate MAC, mild LAE, trivial TR   . Chronic kidney disease    STAGE 4  . Chronic kidney disease on  chronic dialysis (HCC)    t, th, sat  . Complication of anesthesia   . Depression   . Diabetes mellitus Dx 1989  . Elevated lipids   . GERD (gastroesophageal reflux disease)   . Gout   . Heart murmur    asymptomatic  . Hepatitis C Dx 2013  . Hypertension Dx 1989  . Infected surgical wound    Lt arm  . Myocardial infarction ([place]) 07/2015  . Obesity   . Pancreatitis 2013  . Pneumonia   . Refusal of blood transfusions as patient is Jehovah's Witness   . Tendinitis   . Tremors of nervous system    LEFT HAND  . Ulcer 2010    Patient Active Problem List   Diagnosis Date Noted  . Allergy, unspecified, initial encounter 03/29/2020  . Anaphylactic shock, unspecified, initial encounter 03/29/2020  . Fluid overload 01/30/2020  . Pain in left upper arm 01/18/2020  . Cellulitis 01/16/2020  . Anemia due to end stage renal disease ([place]) 01/16/2020  . Diabetic polyneuropathy associated with type 2 diabetes mellitus ([place]) 11/06/2019  . Hemodialysis status ([place]) 11/06/2019  . Nystagmus 11/06/2019  . Intermittent claudication ([place]) 11/06/2019  . Vertigo of central origin 11/06/2019  . Weakness   . Hypervolemia associated with renal insufficiency 10/31/2019  . BMI 45.0-49.9, adult ([place]) 10/18/2019  .  Ascending aortic aneurysm ([place]) 03/27/2019  . Puncture wound without foreign body of left forearm, initial encounter 01/02/2019  . Non-healing wound of upper extremity 12/28/2018  . Unspecified open wound of left upper arm, initial encounter 12/06/2018  . Complication from renal dialysis device 10/20/2018  . Iron deficiency anemia, unspecified 10/18/2018  . ESRD (end stage renal disease) on dialysis ([place]) 06/01/2018  . ARF (acute renal failure) ([place]) 05/09/2018  . Colon cancer screening   . LGSIL on Pap smear of cervix 01/27/2018  . Gout 12/25/2017  . AKI (acute kidney injury) ([place]) 12/24/2017  . Hyperglycemia 12/24/2017  . Chronic diastolic heart failure ([place]) 09/30/2017  . Lymphedema  09/30/2017  . Aortic stenosis   . Acute pain of both knees 02/12/2017  . Chronic gout due to renal impairment of multiple sites without tophus 02/12/2017  . Esophageal dysphagia 02/12/2017  . Osteoarthritis 01/22/2017  . Diabetic hyperosmolar non-ketotic state ([place]) 12/13/2016  . CAD (coronary artery disease) 12/13/2016  . Hyperlipidemia 12/13/2016  . Hyperphosphatemia 12/13/2016  . Acute diastolic CHF (congestive heart failure) ([place])   . Asthma 11/29/2015  . Depression 09/05/2015  . GERD (gastroesophageal reflux disease) 03/25/2015  . Environmental allergies 03/14/2015  . SOB (shortness of breath) 02/15/2015  . Chronic hepatitis C without hepatic coma ([place]) 01/31/2015  . Diabetic neuropathy ([place]) 01/31/2015  . OSA (obstructive sleep apnea) 01/01/2015  . Poor dentition 11/13/2014  . Essential hypertension 10/08/2014  . Morbid (severe) obesity due to excess calories ([place]) 10/08/2014  . Localized, primary osteoarthritis 01/25/2014  . Congestive heart failure ([place]) 03/31/2013  . Family history of diabetes mellitus 03/31/2013  . Pain in limb 03/31/2013  . Type 2 diabetes mellitus with ESRD (end-stage renal disease) ([place]) 03/25/2012    Class: Chronic    Past Surgical History:  Procedure Laterality Date  . A/V FISTULAGRAM Left 04/11/2019   Procedure: A/V FISTULAGRAM;  Surgeon: Katha Cabal, MD;  Location: [facility];  Service: Cardiovascular;  Laterality: Left;  . A/V FISTULAGRAM Left 06/02/2019   Procedure: A/V FISTULAGRAM;  Surgeon: Katha Cabal, MD;  Location: [facility];  Service: Cardiovascular;  Laterality: Left;  . APPLICATION OF WOUND VAC Left 08/14/2017   Procedure: APPLICATION OF WOUND VAC Exchange;  Surgeon: Robert Bellow, MD;  Location: [facility];  Service: General;  Laterality: Left;  . APPLICATION OF WOUND VAC Left 12/21/2018   Procedure: APPLICATION OF WOUND VAC;  Surgeon: Katha Cabal, MD;  Location: [facility];  Service: Vascular;   Laterality: Left;  . AV FISTULA PLACEMENT Left 08/19/2018   Procedure: ARTERIOVENOUS (AV) FISTULA CREATION ( BRACHIOBASILIC );  Surgeon: Katha Cabal, MD;  Location: [facility];  Service: Vascular;  Laterality: Left;  . BASCILIC VEIN TRANSPOSITION Left 11/18/2018   Procedure: BASCILIC VEIN TRANSPOSITION;  Surgeon: Katha Cabal, MD;  Location: [facility];  Service: Vascular;  Laterality: Left;  . CHOLECYSTECTOMY    . COLONOSCOPY WITH PROPOFOL N/A 02/03/2018   Procedure: COLONOSCOPY WITH PROPOFOL;  Surgeon: Lin Landsman, MD;  Location: [facility];  Service: Gastroenterology;  Laterality: N/A;  . CORONARY ANGIOPLASTY  07/2015   STENT  . CORONARY STENT INTERVENTION N/A 09/18/2016   Procedure: Coronary Stent Intervention;  Surgeon: Troy Sine, MD;  Location: [facility];  Service: Cardiovascular;  Laterality: N/A;  . DIALYSIS/PERMA CATHETER INSERTION N/A 05/10/2018   Procedure: DIALYSIS/PERMA CATHETER INSERTION;  Surgeon: Katha Cabal, MD;  Location: [place] [facility];  Service: Cardiovascular;  Laterality: N/A;  .  DRESSING CHANGE UNDER ANESTHESIA Left 08/15/2017   Procedure: exploration of wound for bleeding;  Surgeon: Robert Bellow, MD;  Location: [facility];  Service: General;  Laterality: Left;  . ESOPHAGOGASTRODUODENOSCOPY (EGD) WITH PROPOFOL N/A 02/03/2018   Procedure: ESOPHAGOGASTRODUODENOSCOPY (EGD) WITH PROPOFOL;  Surgeon: Lin Landsman, MD;  Location: [facility] ENDOSCOPY;  Service: Gastroenterology;  Laterality: N/A;  . EYE SURGERY  11/17/2018  . INCISION AND DRAINAGE ABSCESS Left 08/12/2017   Procedure: INCISION AND DRAINAGE ABSCESS;  Surgeon: Robert Bellow, MD;  Location: [facility];  Service: General;  Laterality: Left;  . KNEE ARTHROSCOPY    . LEFT HEART CATH N/A 09/18/2016   Procedure: Left Heart Cath;  Surgeon: Troy Sine, MD;  Location: [facility];  Service: Cardiovascular;  Laterality: N/A;  . LEFT HEART CATH AND CORONARY ANGIOGRAPHY  N/A 09/16/2016   Procedure: Left Heart Cath and Coronary Angiography;  Surgeon: Burnell Blanks, MD;  Location: [facility];  Service: Cardiovascular;  Laterality: N/A;  . LEFT HEART CATH AND CORONARY ANGIOGRAPHY N/A 04/29/2017   Procedure: LEFT HEART CATH AND CORONARY ANGIOGRAPHY;  Surgeon: Nelva Bush, MD;  Location: [facility];  Service: Cardiovascular;  Laterality: N/A;  . LOWER EXTREMITY ANGIOGRAPHY Right 03/08/2018   Procedure: LOWER EXTREMITY ANGIOGRAPHY;  Surgeon: Katha Cabal, MD;  Location: [place];  Service: Cardiovascular;  Laterality: Right;  . TUBAL LIGATION    . TUBAL LIGATION    . UPPER EXTREMITY ANGIOGRAPHY Right 09/19/2019   Procedure: UPPER EXTREMITY ANGIOGRAPHY;  Surgeon: Katha Cabal, MD;  Location: [facility];  Service: Cardiovascular;  Laterality: Right;  . WOUND DEBRIDEMENT Left 12/21/2018   Procedure: DEBRIDEMENT WOUND;  Surgeon: Katha Cabal, MD;  Location: [facility];  Service: Vascular;  Laterality: Left;  . WOUND DEBRIDEMENT Left 12/30/2018   Procedure: DEBRIDEMENT WOUND WITH VAC PLACEMENT (LEFT UPPER EXTREMITY);  Surgeon: Katha Cabal, MD;  Location: [facility];  Service: Vascular;  Laterality: Left;     OB History    Gravida  4   Para  3   Term  3   Preterm      AB  1   Living  3     SAB  1   TAB      Ectopic      Multiple      Live Births  3           Family History  Problem Relation Age of Onset  . Colon cancer Mother   . Heart attack Other   . Heart attack Maternal Grandmother   . Hypertension Sister   . Hypertension Brother   . Diabetes Paternal Grandmother   . Breast cancer Neg Hx     Social History   Tobacco Use  . Smoking status: Former Smoker    Packs/day: 0.25    Years: 6.00    Pack years: 1.50    Types: Cigarettes    Quit date: 10/25/1980    Years since quitting: 39.5  . Smokeless tobacco: Never Used  Vaping Use  . Vaping Use: Never used   Substance Use Topics  . Alcohol use: Yes    Comment: rare  . Drug use: Not Currently    Types: Marijuana    Home Medications Prior to Admission medications   Medication Sig Start Date End Date Taking? Authorizing Provider  albuterol (VENTOLIN HFA) 108 (90 Base) MCG/ACT inhaler Inhale 2 puffs into the lungs every 4 (four)  hours as needed for wheezing or shortness of breath. 10/21/19 01/30/20  Janeece Fitting, PA-C  allopurinol (ZYLOPRIM) 100 MG tablet TAKE 1 TABLET BY MOUTH ONCE DAILY. 12/08/19   [name], PA-C  aspirin EC 81 MG EC tablet Take 1 tablet (81 mg total) by mouth daily. 09/19/16   Strader, Fransisco Hertz, PA-C  atorvastatin (LIPITOR) 80 MG tablet TAKE 1 TABLET BY MOUTH ONCE DAILY AT 6PM 04/23/20   [name], PA-C  AURYXIA 1 GM 210 MG(Fe) tablet Take 2 tablets (420 mg total) by mouth 3 (three) times daily with meals. 05/03/20   [name], PA-C  b complex-vitamin c-folic acid (NEPHRO-VITE) 0.8 MG TABS tablet Take 1 tablet by mouth daily.     [provider]  buPROPion (WELLBUTRIN SR) 150 MG 12 hr tablet Take 1 tablet (150 mg total) by mouth 2 (two) times daily. 05/03/20   [name], PA-C  carvedilol (COREG) 25 MG tablet TAKE (1) TABLET BY MOUTH TWICE A DAY WITH MEALS (BREAKFAST AND SUPPER) 05/03/20   [name], PA-C  Cinacalcet HCl (SENSIPAR PO) Take 30 mg by mouth every dialysis. 01/04/20 01/02/21  [provider]  Continuous Blood Gluc Receiver (FREESTYLE LIBRE READER) DEVI To check blood sugar ACHS 04/22/20   [name], PA-C  Continuous Blood Gluc Sensor ([place]) MISC To check blood sugar ACHS; Change every 14 days 04/22/20   [name], PA-C  cyclobenzaprine (FLEXERIL) 5 MG tablet Take 1 tablet (5 mg total) by mouth 3 (three) times daily as needed for muscle spasms. 08/10/19   [name], PA-C  dicyclomine (BENTYL) 20 MG tablet TAKE 1 TABLET(20 MG) BY MOUTH FOUR TIMES  DAILY BEFORE MEALS AND AT BEDTIME Patient taking differently: Take 20 mg by mouth 4 (four) times daily -  before meals and at bedtime.  10/16/19   [name], PA-C  docusate sodium (COLACE) 100 MG capsule Take 1 capsule (100 mg total) by mouth 2 (two) times daily as needed for mild constipation. 09/20/17   Gouru, Illene Silver, MD  doxepin (SINEQUAN) 10 MG capsule TAKE 1 CAPSULE(10 MG) BY MOUTH FOUR TIMES DAILY AS NEEDED FOR ITCHING 05/03/20   Burnette, Clearnce Sorrel, PA-C  DULERA 200-5 MCG/ACT AERO Inhale 2 puffs into the lungs 2 (two) times daily as needed.  11/26/19   [provider]  fluticasone (FLONASE) 50 MCG/ACT nasal spray Place 2 sprays into both nostrils daily as needed for allergies or rhinitis. 09/28/17   [name], PA-C  fluticasone furoate-vilanterol (BREO ELLIPTA) 200-25 MCG/INH AEPB Inhale 1 puff into the lungs daily. Patient taking differently: Inhale 1 puff into the lungs daily as needed.  11/07/19   [name], PA-C  gentamicin cream (GARAMYCIN) 0.1 % Apply 1 application topically 2 (two) times daily. Patient taking differently: Apply 1 application topically 2 (two) times daily as needed.  11/20/19   Edrick Kins, DPM  hydrALAZINE (APRESOLINE) 100 MG tablet TAKE (1) TABLET BY MOUTH THREE TIMES DAILY 05/03/20   [name], PA-C  hydrOXYzine (ATARAX/VISTARIL) 25 MG tablet Take 1 tablet (25 mg total) by mouth 4 (four) times daily. 05/03/20   [name], PA-C  insulin detemir (LEVEMIR FLEXTOUCH) 100 UNIT/ML FlexPen Inject 60 Units into the skin 2 (two) times daily. 05/03/20   [name], PA-C  insulin lispro (HUMALOG KWIKPEN) 100 UNIT/ML KwikPen Inject 25 Units into the skin with breakfast, with lunch, and with evening meal. 05/14/20  [name] M, PA-C  lidocaine-prilocaine (EMLA) cream Apply 1 application topically as needed. 03/27/19   Schnier, Dolores Lory, MD  LOPERAMIDE HCL PO Take 1 tablet by mouth as needed.  10/21/19  10/19/20  [provider]  loratadine (CLARITIN) 10 MG tablet TAKE 1 TABLET BY MOUTH ONCE DAILY. Patient taking differently: Take 10 mg by mouth at bedtime.  08/11/19   [name], PA-C  magic mouthwash w/lidocaine SOLN Take 5 mLs by mouth 3 (three) times daily as needed for mouth pain. 03/21/20   [name], PA-C  mometasone (ELOCON) 0.1 % cream APPLY AS DIRECTED TO AFFECTED AREA ONCE DAILY. Patient taking differently: Apply 1 application topically daily as needed.  12/08/18   [name], PA-C  montelukast (SINGULAIR) 10 MG tablet Take 1 tablet (10 mg total) by mouth at bedtime. 05/03/20   [name], PA-C  nitroGLYCERIN (NITROSTAT) 0.4 MG SL tablet Place 1 tablet (0.4 mg total) under the tongue every 5 (five) minutes x 3 doses as needed for chest pain. 05/03/20   [name], PA-C  Nutritional Supplements (FEEDING SUPPLEMENT, NEPRO CARB STEADY,) LIQD Take 237 mLs by mouth daily. 02/01/20   Loletha Grayer, MD  nystatin (MYCOSTATIN) 100000 UNIT/ML suspension Take 5 mLs (500,000 Units total) by mouth 4 (four) times daily. Patient not taking: Reported on 03/21/2020 01/25/20   [name], PA-C  nystatin cream (MYCOSTATIN) Apply 1 application topically 2 (two) times daily. Patient taking differently: Apply 1 application topically daily as needed for dry skin.  08/10/19   [name], PA-C  omeprazole (PRILOSEC) 20 MG capsule TAKE (1) CAPSULE BY MOUTH ONCE DAILY. 05/03/20   [name], PA-C  pregabalin (LYRICA) 150 MG capsule TAKE 1 CAPSULE(150 MG) BY MOUTH TWICE DAILY 02/29/20   Burnette, Jennifer M, PA-C  SENNA PLUS 8.6-50 MG tablet TAKE ONE TABLET BY MOUTH AT BEDTIME. Patient taking differently: Take 1 tablet by mouth at bedtime as needed.  08/11/19   [name], PA-C  silver sulfADIAZINE (SILVADENE) 1 % cream Apply 1 application topically daily. Apply to right first toe Patient taking differently: Apply 1  application topically daily as needed (pain).  02/21/19   Kris Hartmann, NP  torsemide (DEMADEX) 20 MG tablet TAKE (2) TABLETS BY MOUTH TWICE DAILY. 05/03/20   [name], PA-C  triamcinolone cream (KENALOG) 0.1 % APPLY TO AFFECTED AREA TWICE DAILY Patient taking differently: 2 (two) times daily as needed.  06/17/18   [name], PA-C  valACYclovir (VALTREX) 500 MG tablet TAKE (1) TABLET BY MOUTH EVERY OTHER DAY. 05/03/20   [name], PA-C  VICTOZA 18 MG/3ML SOPN Start with 0.15m daily and increase by 0.636mper week until max dose of 1.8 mg dose achieved. 03/25/20   BuMar DaringPA-C  Vitamin D, Ergocalciferol, (DRISDOL) 1.25 MG (50000 UNIT) CAPS capsule TAKE 1 CAPSULE BY MOUTH ONCE A MONTH 02/23/20   BuMar DaringPA-C  gabapentin (NEURONTIN) 100 MG capsule Take 1 capsule (100 mg total) by mouth 3 (three) times daily. 08/19/17 02/03/19  PaFritzi MandesMD    Allergies    Shellfish allergy and Diazepam  Review of Systems   Review of Systems  Constitutional: Negative for chills and fever.  Musculoskeletal: Positive for arthralgias.  Skin: Negative for color change and wound.  Neurological: Negative for weakness and numbness.    Physical Exam Updated Vital Signs BP (!) 154/119 (BP Location: Right Arm)   Pulse 91  Temp 98.9 F (37.2 C) (Oral)   Resp (!) 22   Ht _0  (1.727 m)   Wt 129.7 kg   SpO2 90%   BMI 43.49 kg/m   Physical Exam Vitals and nursing note reviewed.  Constitutional:      General: She is not in acute distress.    Appearance: She is well-developed. She is obese. She is not ill-appearing or diaphoretic.  HENT:     Head: Normocephalic and atraumatic.  Eyes:     Conjunctiva/sclera: Conjunctivae normal.  Cardiovascular:     Rate and Rhythm: Normal rate and regular rhythm.     Pulses: Normal pulses.     Heart sounds: No murmur heard.   Pulmonary:     Effort: Pulmonary effort is normal. No respiratory distress.      Breath sounds: Normal breath sounds.  Abdominal:     General: Abdomen is flat.     Palpations: Abdomen is soft.     Tenderness: There is no abdominal tenderness.  Musculoskeletal:        General: Tenderness present. No swelling, deformity or signs of injury.     Cervical back: Neck supple.     Right lower leg: No edema.     Left lower leg: No edema.       Legs:     Comments: Right thigh with focal tenderness to the mid lateral outer thigh.  There are no overlying skin changes, no warmth, no noticeable swelling.  Patient is morbidly obese.  She was able to stand up and take several steps but with assistance.  She has slightly decreased strength with flexion and extension secondary to pain.  She has 2+ DP pulses bilaterally.  Skin:    General: Skin is warm and dry.  Neurological:     General: No focal deficit present.     Mental Status: She is alert and oriented to person, place, and time.     Sensory: No sensory deficit.     Motor: Weakness present.     ED Results / Procedures / Treatments   Labs (all labs ordered are listed, but only abnormal results are displayed) Labs Reviewed - No data to display  EKG None  Radiology DG Femur Min 2 Views Right  Result Date: 05/15/2020 CLINICAL DATA:  Right leg pain EXAM: RIGHT FEMUR 2 VIEWS COMPARISON:  None. FINDINGS: No acute bony abnormality. Specifically, no fracture, subluxation, or dislocation. Joint spaces maintained. Vascular calcifications. IMPRESSION: No acute bony abnormality. Electronically Signed   By: Rolm Baptise M.D.   On: 05/15/2020 21:07    Procedures Procedures (including critical care time)  Medications Ordered in ED Medications - No data to display  ED Course  I have reviewed the triage vital signs and the nursing notes.  Pertinent labs & imaging results that were available during my care of the patient were reviewed by me and considered in my medical decision making (see chart for details).    MDM  Rules/Calculators/A&P                         64 year old female with sudden onset of right thigh pain On presentation, she is alert, oriented, in no acute distress.  Vitals overall reassuring.  Physical exam with focal tenderness to the right outer thigh, however no noticeable masses, evidence of cellulitis or deformities.  No significant swelling or redness.  Was still able to bear weight on my exam.  Plain films without evidence of  injury.  There is no ultrasound services here currently, will send for ambulatory referral to rule out DVT, however my suspicion for this is low.  Encouraged the patient to continue to take her pain medicine, will provide orthopedic referral.  Return precautions discussed.  She was understanding and is agreeable.  At this stage in the ED course, the patient is medically screened and stable for discharge.  Final Clinical Impression(s) / ED Diagnoses Final diagnoses:  Right leg pain    Rx / DC Orders ED Discharge Orders         Ordered    LE VENOUS        05/15/20 2106           Lyndel Safe 05/15/20 2118    Lacretia Leigh, MD 05/15/20 2337

## 2020-05-15 NOTE — Discharge Instructions (Signed)
Your x-Adkison did not show any evidence of fractures or abnormalities.  We do not have any ultrasound services here tonight, please return at the indicated time back to [facility] for an ultrasound of your leg.  If this is normal, please make sure to follow-up with [facility] who is penumbra I have provided in your discharge paperwork.  Continue to take your pain medicine for pain.  Return to the ER for any new or worsening symptoms.

## 2020-05-15 NOTE — ED Notes (Signed)
Pt talking on phone with family member. 

## 2020-05-15 NOTE — ED Triage Notes (Signed)
Patient c/o right thigh pain and states she has a knot on her right thigh.  Patient reports pain increases with weight bearing Patient denies any injury.

## 2020-05-16 ENCOUNTER — Emergency Department (HOSPITAL_COMMUNITY)
Admission: RE | Admit: 2020-05-16 | Discharge: 2020-05-16 | Disposition: A | Discharge status: Home / Self Care | Payer: Medicare Other | Source: Ambulatory Visit | Attending: Physician Assistant | Admitting: Physician Assistant

## 2020-05-16 DIAGNOSIS — M79604 Pain in right leg: Secondary | ICD-10-CM | POA: Diagnosis not present

## 2020-05-16 NOTE — Progress Notes (Signed)
Lower extremity venous has been completed.   Preliminary results in CV Proc.   Abram Sander 05/16/2020 11:26 AM

## 2020-05-20 ENCOUNTER — Other Ambulatory Visit: Payer: Self-pay | Admitting: Physician Assistant

## 2020-05-20 DIAGNOSIS — I5032 Chronic diastolic (congestive) heart failure: Secondary | ICD-10-CM

## 2020-05-20 DIAGNOSIS — E559 Vitamin D deficiency, unspecified: Secondary | ICD-10-CM

## 2020-05-20 NOTE — Telephone Encounter (Signed)
Requested medication (s) are due for refill today Not Torsemide.  Vit d yes  Requested medication (s) are on the active medication list: yes  Last refill: torsemide 05/03/20  360 3 refills, Vit D 02/23/20 #1  0 refills  Future visit scheduled: yes   Notes to clinic:  Vit d I strength is not delegated. Torsemide has already been ordered to mail order.    Requested Prescriptions  Pending Prescriptions Disp Refills   torsemide (DEMADEX) 20 MG tablet [Pharmacy Med Name: TORSEMIDE 20 MG TABLET] 112 tablet 11    Sig: TAKE (2) TABLETS BY MOUTH TWICE DAILY.      Cardiovascular:  Diuretics - Loop Failed - 05/20/2020  4:39 PM      Failed - Ca in normal range and within 360 days    Calcium  Date Value Ref Range Status  01/31/2020 8.0 (L) 8.9 - 10.3 mg/dL Final   Calcium, Ion  Date Value Ref Range Status  09/14/2015 1.00 (L) 1.12 - 1.23 mmol/L Final          Failed - Na in normal range and within 360 days    Sodium  Date Value Ref Range Status  01/31/2020 132 (L) 135 - 145 mmol/L Final  11/06/2019 138 134 - 144 mmol/L Final          Failed - Cr in normal range and within 360 days    Creat  Date Value Ref Range Status  09/23/2016 1.36 (H) 0.50 - 1.05 mg/dL Final    Comment:      For patients > or = 62 years of age: The upper reference limit for Creatinine is approximately 13% higher for people identified as African-American.      Creatinine, Ser  Date Value Ref Range Status  01/31/2020 7.88 (H) 0.44 - 1.00 mg/dL Final   Creatinine, Urine  Date Value Ref Range Status  03/19/2017 94.44 mg/dL Final  03/19/2017 95.79 mg/dL Final          Failed - Last BP in normal range    BP Readings from Last 1 Encounters:  05/15/20 (!) 148/99          Passed - K in normal range and within 360 days    Potassium  Date Value Ref Range Status  01/31/2020 4.2 3.5 - 5.1 mmol/L Final   Potassium [facility] vascular lab)  Date Value Ref Range Status  09/19/2019 3.5 3.5 - 5.1 Final     Comment:    Performed at [facility], [address], [place], [place] [address]          Passed - Valid encounter within last 6 months    Recent Outpatient Visits           2 months ago Mouth pain   [place], Clearnce Sorrel, [place]   3 months ago [place], [place], [place]   6 months ago Diabetes mellitus type 2, uncontrolled, with complications [facility])   [place], [place]   9 months ago Chest pain, unspecified type   [place], [place]   1 year ago Acute right-sided low back pain without sciatica   [place], [place]       Future Appointments             In 1 month Burnette, Clearnce Sorrel, PA-C [employer], [place]  Vitamin D, Ergocalciferol, (DRISDOL) 1.25 MG (50000 UNIT) CAPS capsule [Pharmacy Med Name: VIT D2 ERGOCAL (50,000 UNIT)] 1 capsule 11    Sig: TAKE 1 CAPSULE BY MOUTH ONCE A MONTH      Endocrinology:  Vitamins - Vitamin D Supplementation Failed - 05/20/2020  4:39 PM      Failed - 50,000 IU strengths are not delegated      Failed - Ca in normal range and within 360 days    Calcium  Date Value Ref Range Status  01/31/2020 8.0 (L) 8.9 - 10.3 mg/dL Final   Calcium, Ion  Date Value Ref Range Status  09/14/2015 1.00 (L) 1.12 - 1.23 mmol/L Final          Failed - Phosphate in normal range and within 360 days    Phosphorus  Date Value Ref Range Status  01/31/2020 5.1 (H) 2.5 - 4.6 mg/dL Final          Failed - Vitamin D in normal range and within 360 days    No results found for: [id number], [id number], [id number], 25OHVITD3, [place], [place], 25OHVITD2, 25OHVITD1, 25OHVITD2, 25OHVITD3, VD25OH        Passed - Valid encounter within last 12 months    Recent Outpatient Visits           2 months ago Mouth pain   [place], Clearnce Sorrel,  [place]   3 months ago [place], [place], [place]   6 months ago Diabetes mellitus type 2, uncontrolled, with complications [facility])   [place], [place], [place]   9 months ago Chest pain, unspecified type   [facility] [place], [place], [place]   1 year ago Acute right-sided low back pain without sciatica   [place], Vickki Muff, [place]       Future Appointments             In 1 month Burnette, Clearnce Sorrel, PA-C [employer], [place]

## 2020-05-20 NOTE — Telephone Encounter (Signed)
AWV was completed 12/26/19. [employer] may have been calling to tell patient she needs her CPE with PCP. Tried to contact pt and she did not answer and VM was full. Will try again later.

## 2020-05-24 ENCOUNTER — Other Ambulatory Visit: Payer: Self-pay | Admitting: Physician Assistant

## 2020-05-24 ENCOUNTER — Telehealth: Payer: Self-pay

## 2020-05-24 DIAGNOSIS — G8929 Other chronic pain: Secondary | ICD-10-CM

## 2020-05-24 DIAGNOSIS — E1122 Type 2 diabetes mellitus with diabetic chronic kidney disease: Secondary | ICD-10-CM

## 2020-05-24 DIAGNOSIS — E1100 Type 2 diabetes mellitus with hyperosmolarity without nonketotic hyperglycemic-hyperosmolar coma (NKHHC): Secondary | ICD-10-CM

## 2020-05-24 DIAGNOSIS — K1379 Other lesions of oral mucosa: Secondary | ICD-10-CM

## 2020-05-24 DIAGNOSIS — E1142 Type 2 diabetes mellitus with diabetic polyneuropathy: Secondary | ICD-10-CM

## 2020-05-24 DIAGNOSIS — M25562 Pain in left knee: Secondary | ICD-10-CM

## 2020-05-24 DIAGNOSIS — N186 End stage renal disease: Secondary | ICD-10-CM

## 2020-05-24 MED ORDER — FREESTYLE LIBRE 14 DAY READER DEVI: 0 refills | Status: DC

## 2020-05-24 MED ORDER — OXYCODONE HCL 5 MG PO TABS: 5.0000 mg | ORAL_TABLET | Freq: Two times a day (BID) | ORAL | 0 refills | Status: DC | PRN

## 2020-05-24 MED ORDER — OXYCODONE HCL 5 MG PO CAPS: 5.0000 mg | ORAL_CAPSULE | Freq: Two times a day (BID) | ORAL | 0 refills | Status: DC | PRN

## 2020-05-24 MED ORDER — FREESTYLE LIBRE READER DEVI: 0 refills | Status: DC

## 2020-05-24 MED ORDER — LEVEMIR FLEXTOUCH 100 UNIT/ML ~~LOC~~ SOPN: 60.0000 [IU] | PEN_INJECTOR | Freq: Two times a day (BID) | SUBCUTANEOUS | 3 refills | Status: DC

## 2020-05-24 MED ORDER — FREESTYLE LIBRE 14 DAY SENSOR MISC: 11 refills | Status: DC

## 2020-05-24 MED ORDER — FREESTYLE LIBRE SENSOR SYSTEM MISC: 4 refills | Status: DC

## 2020-05-24 MED ORDER — MAGIC MOUTHWASH W/LIDOCAINE: 5.0000 mL | Freq: Three times a day (TID) | ORAL | 0 refills | Status: DC | PRN

## 2020-05-24 NOTE — Telephone Encounter (Signed)
Copied from [name] [id number]. Topic: General - Other >> May 24, 2020  2:29 PM [name] wrote: [name] is requesting the tablet form of oxycodone be sent to the pharmacy due to them being out of the capsules. Please advise [facility] [phone] [name], [place]  Phone:  [phone] Fax:  [phone]

## 2020-05-24 NOTE — Telephone Encounter (Signed)
Patient advised as directed below. Need Levemir refill

## 2020-05-24 NOTE — Addendum Note (Signed)
Addended by: Doristine Devoid on: 05/24/2020 04:17 PM   Modules accepted: Orders

## 2020-05-24 NOTE — Telephone Encounter (Signed)
Copied from [place] [id number]. Topic: General - Other >> May 24, 2020 12:22 PM Leward Quan A wrote: Reason for CRM: Patient called to inform [name] that she missed dialysis on Saturday 05/18/20 due to knee pain say she need more Hydrocodone. Also say that she have received the new insulin and need to know what to stop or does she keep taking all 3. Patient also say that she never received her glucometer meter Free Style Libre from the pharmacy or the magic mouthwash. Received One Touch test strips but did not receive a glucometer. Please call  Ph# [phone]

## 2020-05-24 NOTE — Telephone Encounter (Signed)
refilled 

## 2020-05-24 NOTE — Telephone Encounter (Signed)
Oxycodone refilled.  She should take the Levemir and Humalog. I believe her insurance was no longer covering Novolog. She can definitely finish what Novolog she has and once completed then switch to the Humalog.   The freestyle Elenor Legato was sent in on 04/22/20. I have resent it in again.   Also we were just informed by a local [facility] that a law just passed this week prohibiting those pharmacies from compounding magic mouthwash. She may need to check [facility] to see if they still are making it. If not she would have to use oragel OTC.

## 2020-05-27 ENCOUNTER — Other Ambulatory Visit: Payer: Self-pay | Admitting: Physician Assistant

## 2020-05-27 DIAGNOSIS — N186 End stage renal disease: Secondary | ICD-10-CM

## 2020-05-27 DIAGNOSIS — E1100 Type 2 diabetes mellitus with hyperosmolarity without nonketotic hyperglycemic-hyperosmolar coma (NKHHC): Secondary | ICD-10-CM

## 2020-05-27 DIAGNOSIS — E1122 Type 2 diabetes mellitus with diabetic chronic kidney disease: Secondary | ICD-10-CM

## 2020-05-27 DIAGNOSIS — E1142 Type 2 diabetes mellitus with diabetic polyneuropathy: Secondary | ICD-10-CM

## 2020-05-27 NOTE — Telephone Encounter (Signed)
Patient stated that she  has been without insulin for 3 days and that she needs prescription  written for a sliding scale so she can use it 4x a day.

## 2020-05-27 NOTE — Telephone Encounter (Signed)
Medication Refill - Medication: Continuous Blood Gluc Sensor (FREESTYLE LIBRE 14 DAY SENSOR) MISC (Patient was advised by pharmacy to contact PCP to get a new prescription sent over. Patient also requesting a callback once medication has been sent to pharmacy)   Has the patient contacted their pharmacy? yes (Agent: If no, request that the patient contact the pharmacy for the refill.) (Agent: If yes, when and what did the pharmacy advise?)Contact PCP  Preferred Pharmacy (with phone number or street name):  [id number] [place], [place] Phone:  [phone]  Fax:  [phone]       Agent: Please be advised that RX refills may take up to 3 business days. We ask that you follow-up with your pharmacy.

## 2020-05-30 ENCOUNTER — Telehealth: Payer: Self-pay

## 2020-05-30 NOTE — Telephone Encounter (Signed)
Copied from [place] [phone] [phone]. Topic: General - Other >> May 30, 2020  2:32 PM [name] wrote: Patient called to inform PCP that her tongue burns and is white and she cant seem to eat or drink. Patient wants to know if Marlyn Corporal can send in any medication to the pharmacy.

## 2020-05-30 NOTE — Telephone Encounter (Signed)
Advised pt she would need a office visit.  She agreed to come in tomorrow (05/31/2020) at 11am.   Thanks,   -Mickel Baas

## 2020-05-31 ENCOUNTER — Other Ambulatory Visit: Payer: Self-pay

## 2020-05-31 ENCOUNTER — Ambulatory Visit (INDEPENDENT_AMBULATORY_CARE_PROVIDER_SITE_OTHER): Payer: Medicare Other | Admitting: Physician Assistant

## 2020-05-31 ENCOUNTER — Encounter: Payer: Self-pay | Admitting: Physician Assistant

## 2020-05-31 VITALS — BP 108/57 | HR 74 | Wt 285.0 lb

## 2020-05-31 DIAGNOSIS — I5031 Acute diastolic (congestive) heart failure: Secondary | ICD-10-CM

## 2020-05-31 DIAGNOSIS — R1319 Other dysphagia: Secondary | ICD-10-CM | POA: Diagnosis not present

## 2020-05-31 DIAGNOSIS — B370 Candidal stomatitis: Secondary | ICD-10-CM | POA: Diagnosis not present

## 2020-05-31 DIAGNOSIS — I2510 Atherosclerotic heart disease of native coronary artery without angina pectoris: Secondary | ICD-10-CM

## 2020-05-31 DIAGNOSIS — Z23 Encounter for immunization: Secondary | ICD-10-CM

## 2020-05-31 DIAGNOSIS — I350 Nonrheumatic aortic (valve) stenosis: Secondary | ICD-10-CM

## 2020-05-31 DIAGNOSIS — I5032 Chronic diastolic (congestive) heart failure: Secondary | ICD-10-CM | POA: Diagnosis not present

## 2020-05-31 MED ORDER — NYSTATIN 100000 UNIT/ML MT SUSP: 5.0000 mL | Freq: Four times a day (QID) | OROMUCOSAL | 5 refills | Status: AC

## 2020-05-31 NOTE — Progress Notes (Signed)
Established patient visit   Patient: [name]   DOB: 1955/09/19   64 y.o. Female  MRN: [id number] Visit Date: 05/31/2020  Today's healthcare provider: [name], PA-C   No chief complaint on file.  Subjective    Oral Pain  This is a new problem. The current episode started in the past 7 days. The problem occurs constantly. The problem has been gradually worsening. Associated symptoms include difficulty swallowing. Pertinent negatives include no fever or sinus pressure. She has tried acetaminophen and NSAIDs for the symptoms. The treatment provided no relief.   Mouth is covered in white film. Hurts to open mouth. Patient is diabetic with ESRD on dialysis and has inhalers she uses for COPD.  Also was seen at ER in [place] for dysphagia. Reports it is difficulty with solid foods, liquids are ok. Feels like it gets stuck in her throat and chest area.   Patient Active Problem List   Diagnosis Date Noted  . Allergy, unspecified, initial encounter 03/29/2020  . Anaphylactic shock, unspecified, initial encounter 03/29/2020  . Fluid overload 01/30/2020  . Pain in left upper arm 01/18/2020  . Cellulitis 01/16/2020  . Anemia due to end stage renal disease ([place]) 01/16/2020  . Diabetic polyneuropathy associated with type 2 diabetes mellitus ([place]) 11/06/2019  . Hemodialysis status ([place]) 11/06/2019  . Nystagmus 11/06/2019  . Intermittent claudication ([place]) 11/06/2019  . Vertigo of central origin 11/06/2019  . Weakness   . Hypervolemia associated with renal insufficiency 10/31/2019  . BMI 45.0-49.9, adult ([place]) 10/18/2019  . Ascending aortic aneurysm ([place]) 03/27/2019  . Puncture wound without foreign body of left forearm, initial encounter 01/02/2019  . Non-healing wound of upper extremity 12/28/2018  . Unspecified open wound of left upper arm, initial encounter 12/06/2018  . Complication from renal dialysis device 10/20/2018  . Iron deficiency anemia, unspecified  10/18/2018  . ESRD (end stage renal disease) on dialysis ([place]) 06/01/2018  . ARF (acute renal failure) ([place]) 05/09/2018  . Colon cancer screening   . LGSIL on Pap smear of cervix 01/27/2018  . Gout 12/25/2017  . AKI (acute kidney injury) ([place]) 12/24/2017  . Hyperglycemia 12/24/2017  . Chronic diastolic heart failure ([place]) 09/30/2017  . Lymphedema 09/30/2017  . Aortic stenosis   . Acute pain of both knees 02/12/2017  . Chronic gout due to renal impairment of multiple sites without tophus 02/12/2017  . Esophageal dysphagia 02/12/2017  . Osteoarthritis 01/22/2017  . Diabetic hyperosmolar non-ketotic state ([place]) 12/13/2016  . CAD (coronary artery disease) 12/13/2016  . Hyperlipidemia 12/13/2016  . Hyperphosphatemia 12/13/2016  . Acute diastolic CHF (congestive heart failure) ([place])   . Asthma 11/29/2015  . Depression 09/05/2015  . GERD (gastroesophageal reflux disease) 03/25/2015  . Environmental allergies 03/14/2015  . SOB (shortness of breath) 02/15/2015  . Chronic hepatitis C without hepatic coma ([place]) 01/31/2015  . Diabetic neuropathy ([place]) 01/31/2015  . OSA (obstructive sleep apnea) 01/01/2015  . Poor dentition 11/13/2014  . Essential hypertension 10/08/2014  . Morbid (severe) obesity due to excess calories ([place]) 10/08/2014  . Localized, primary osteoarthritis 01/25/2014  . Congestive heart failure ([place]) 03/31/2013  . Family history of diabetes mellitus 03/31/2013  . Pain in limb 03/31/2013  . Type 2 diabetes mellitus with ESRD (end-stage renal disease) ([place]) 03/25/2012    Class: Chronic   Past Medical History:  Diagnosis Date  . Anemia   . Aortic stenosis    Echo 8/18: mean 13, peak 28, LVOT/AV mean velocity 0.51  .  Arthritis   . Asthma    As a child   . Bronchitis   . CAD (coronary artery disease)    a. 09/2016: 50% Ost 1st Mrg stenosis, 50% 2nd Mrg stenosis, 20% Mid-Cx, 95% Prox LAD, 40% mid-LAD, and 10% dist-LAD stenosis. Staged PCI with DES to Prox-LAD.   [name]  Chronic combined systolic and diastolic CHF (congestive heart failure) ([place]) 2011   echo 2/18: EF 55-60, normal wall motion, grade 2 diastolic dysfunction, trivial AI // echo 3/18: Septal and apical HK, EF 45-50, normal wall motion, trivial AI, mild LAE, PASP 38 // echo 8/18: EF 60-65, normal wall motion, grade 1 diastolic dysfunction, calcified aortic valve leaflets, mild aortic stenosis (mean 13, peak 28, LVOT/AV mean velocity 0.51), mild AI, moderate MAC, mild LAE, trivial TR   . Chronic kidney disease    STAGE 4  . Chronic kidney disease on chronic dialysis (HCC)    t, th, sat  . Complication of anesthesia   . Depression   . Diabetes mellitus Dx 1989  . Elevated lipids   . GERD (gastroesophageal reflux disease)   . Gout   . Heart murmur    asymptomatic  . Hepatitis C Dx 2013  . Hypertension Dx 1989  . Infected surgical wound    Lt arm  . Myocardial infarction ([place]) 07/2015  . Obesity   . Pancreatitis 2013  . Pneumonia   . Refusal of blood transfusions as patient is Jehovah's Witness   . Tendinitis   . Tremors of nervous system    LEFT HAND  . Ulcer 2010   Social History   Tobacco Use  . Smoking status: Former Smoker    Packs/day: 0.25    Years: 6.00    Pack years: 1.50    Types: Cigarettes    Quit date: 10/25/1980    Years since quitting: 39.6  . Smokeless tobacco: Never Used  Vaping Use  . Vaping Use: Never used  Substance Use Topics  . Alcohol use: Yes    Comment: rare  . Drug use: Not Currently    Types: Marijuana   Allergies  Allergen Reactions  . Shellfish Allergy Anaphylaxis and Swelling  . Diazepam Other (See Comments)    "felt like out of body experience"     Medications: Outpatient Medications Prior to Visit  Medication Sig  . allopurinol (ZYLOPRIM) 100 MG tablet TAKE 1 TABLET BY MOUTH ONCE DAILY.  [name] aspirin EC 81 MG EC tablet Take 1 tablet (81 mg total) by mouth daily.  [name] atorvastatin (LIPITOR) 80 MG tablet TAKE 1 TABLET BY MOUTH ONCE DAILY AT  6PM  . AURYXIA 1 GM 210 MG(Fe) tablet Take 2 tablets (420 mg total) by mouth 3 (three) times daily with meals.  [name] b complex-vitamin c-folic acid (NEPHRO-VITE) 0.8 MG TABS tablet Take 1 tablet by mouth daily.   [name] buPROPion (WELLBUTRIN SR) 150 MG 12 hr tablet Take 1 tablet (150 mg total) by mouth 2 (two) times daily.  . carvedilol (COREG) 25 MG tablet TAKE (1) TABLET BY MOUTH TWICE A DAY WITH MEALS (BREAKFAST AND SUPPER)  . Cinacalcet HCl (SENSIPAR PO) Take 30 mg by mouth every dialysis.  . Continuous Blood Gluc Receiver (FREESTYLE LIBRE 14 DAY READER) DEVI To check blood sugar ACHS  . Continuous Blood Gluc Sensor (FREESTYLE LIBRE 14 DAY SENSOR) MISC To check blood sugar ACHS; change every 14 days  . cyclobenzaprine (FLEXERIL) 5 MG tablet Take 1 tablet (5 mg total) by mouth 3 (three) times  daily as needed for muscle spasms.  [name] dicyclomine (BENTYL) 20 MG tablet TAKE 1 TABLET(20 MG) BY MOUTH FOUR TIMES DAILY BEFORE MEALS AND AT BEDTIME (Patient taking differently: Take 20 mg by mouth 4 (four) times daily -  before meals and at bedtime. )  . docusate sodium (COLACE) 100 MG capsule Take 1 capsule (100 mg total) by mouth 2 (two) times daily as needed for mild constipation.  [name] doxepin (SINEQUAN) 10 MG capsule TAKE 1 CAPSULE(10 MG) BY MOUTH FOUR TIMES DAILY AS NEEDED FOR ITCHING  . DULERA 200-5 MCG/ACT AERO Inhale 2 puffs into the lungs 2 (two) times daily as needed.   . fluticasone (FLONASE) 50 MCG/ACT nasal spray Place 2 sprays into both nostrils daily as needed for allergies or rhinitis.  . fluticasone furoate-vilanterol (BREO ELLIPTA) 200-25 MCG/INH AEPB Inhale 1 puff into the lungs daily. (Patient taking differently: Inhale 1 puff into the lungs daily as needed. )  . gentamicin cream (GARAMYCIN) 0.1 % Apply 1 application topically 2 (two) times daily. (Patient taking differently: Apply 1 application topically 2 (two) times daily as needed. )  . hydrALAZINE (APRESOLINE) 100 MG tablet TAKE (1) TABLET BY  MOUTH THREE TIMES DAILY  . hydrOXYzine (ATARAX/VISTARIL) 25 MG tablet Take 1 tablet (25 mg total) by mouth 4 (four) times daily.  . insulin detemir (LEVEMIR FLEXTOUCH) 100 UNIT/ML FlexPen Inject 60 Units into the skin 2 (two) times daily.  . insulin lispro (HUMALOG KWIKPEN) 100 UNIT/ML KwikPen Inject 25 Units into the skin with breakfast, with lunch, and with evening meal.  . lidocaine-prilocaine (EMLA) cream Apply 1 application topically as needed.  [name] LOPERAMIDE HCL PO Take 1 tablet by mouth as needed.   . loratadine (CLARITIN) 10 MG tablet TAKE 1 TABLET BY MOUTH ONCE DAILY. (Patient taking differently: Take 10 mg by mouth at bedtime. )  . mometasone (ELOCON) 0.1 % cream APPLY AS DIRECTED TO AFFECTED AREA ONCE DAILY. (Patient taking differently: Apply 1 application topically daily as needed. )  . montelukast (SINGULAIR) 10 MG tablet Take 1 tablet (10 mg total) by mouth at bedtime.  . nitroGLYCERIN (NITROSTAT) 0.4 MG SL tablet Place 1 tablet (0.4 mg total) under the tongue every 5 (five) minutes x 3 doses as needed for chest pain.  . Nutritional Supplements (FEEDING SUPPLEMENT, NEPRO CARB STEADY,) LIQD Take 237 mLs by mouth daily.  [name] nystatin (MYCOSTATIN) 100000 UNIT/ML suspension Take 5 mLs (500,000 Units total) by mouth 4 (four) times daily.  [name] nystatin cream (MYCOSTATIN) Apply 1 application topically 2 (two) times daily. (Patient taking differently: Apply 1 application topically daily as needed for dry skin. )  . omeprazole (PRILOSEC) 20 MG capsule TAKE (1) CAPSULE BY MOUTH ONCE DAILY.  [name] oxyCODONE (ROXICODONE) 5 MG immediate release tablet Take 1 tablet (5 mg total) by mouth every 12 (twelve) hours as needed for severe pain.  . pregabalin (LYRICA) 150 MG capsule TAKE 1 CAPSULE(150 MG) BY MOUTH TWICE DAILY  . SENNA PLUS 8.6-50 MG tablet TAKE ONE TABLET BY MOUTH AT BEDTIME. (Patient taking differently: Take 1 tablet by mouth at bedtime as needed. )  . silver sulfADIAZINE (SILVADENE) 1 % cream  Apply 1 application topically daily. Apply to right first toe (Patient taking differently: Apply 1 application topically daily as needed (pain). )  . torsemide (DEMADEX) 20 MG tablet TAKE (2) TABLETS BY MOUTH TWICE DAILY.  [name] triamcinolone cream (KENALOG) 0.1 % APPLY TO AFFECTED AREA TWICE DAILY (Patient taking differently: 2 (two) times daily as needed. )  .  valACYclovir (VALTREX) 500 MG tablet TAKE (1) TABLET BY MOUTH EVERY OTHER DAY.  [name] VICTOZA 18 MG/3ML SOPN Start with 0.2m daily and increase by 0.662mper week until max dose of 1.8 mg dose achieved.  . Vitamin D, Ergocalciferol, (DRISDOL) 1.25 MG (50000 UNIT) CAPS capsule TAKE 1 CAPSULE BY MOUTH ONCE A MONTH  . albuterol (VENTOLIN HFA) 108 (90 Base) MCG/ACT inhaler Inhale 2 puffs into the lungs every 4 (four) hours as needed for wheezing or shortness of breath.   No facility-administered medications prior to visit.    Review of Systems  Constitutional: Negative.  Negative for fever.  HENT: Positive for mouth sores and trouble swallowing. Negative for congestion, dental problem, drooling, ear discharge, ear pain, facial swelling, hearing loss, nosebleeds, postnasal drip, rhinorrhea, sinus pressure, sinus pain, sneezing, sore throat, tinnitus and voice change.   Respiratory: Negative.   Cardiovascular: Negative.   Neurological: Negative for dizziness, light-headedness and headaches.      Objective    BP (!) 108/57 (BP Location: Right Arm, Patient Position: Sitting, Cuff Size: Large)   Pulse 74   Wt 285 lb (129.3 kg)   SpO2 97%   BMI 43.33 kg/m    Physical Exam Vitals reviewed.  Constitutional:      General: She is not in acute distress.    Appearance: Normal appearance. She is well-developed. She is obese. She is ill-appearing. She is not diaphoretic.  HENT:     Head: Normocephalic and atraumatic.     Mouth/Throat:     Mouth: Mucous membranes are dry. Oral lesions present.     Tongue: Lesions present.     Comments: White  film covering mouth and tongue. Patient with pain opening mouth wide so posterior pharynx not well visualized. Cardiovascular:     Rate and Rhythm: Normal rate and regular rhythm.     Heart sounds: Murmur heard.  No friction rub. No gallop.   Pulmonary:     Effort: Pulmonary effort is normal. No respiratory distress.     Breath sounds: Normal breath sounds. No wheezing or rales.  Musculoskeletal:     Cervical back: Normal range of motion and neck supple.  Neurological:     Mental Status: She is alert.      No results found for any visits on 05/31/20.  Assessment & Plan     1. Thrush Patient high risk for thrush complication due to diabetes and inhalers. Will give Nystatin mouth wash as below. Advised to swish and swallow. Call if not improving and will try Brexafemme tablet. - nystatin (MYCOSTATIN) 100000 UNIT/ML suspension; Take 5 mLs (500,000 Units total) by mouth 4 (four) times daily for 10 days.  Dispense: 200 mL; Refill: 5  2. Esophageal dysphagia New. Solids only. Has never seen GI per patient. Referral placed as below. Consult appreciated. - Ambulatory referral to Gastroenterology  3. Need for influenza vaccination Flu vaccine given today without complication. Patient sat upright for 15 minutes to check for adverse reaction before being released. - Flu Vaccine QUAD 36+ mos IM  4. Chronic diastolic heart failure (HCC) Chronic issues that are overall stable. Needs referral to re-establish with Dr. CoBurt KnackNot seen since 2019.  - Ambulatory referral to Cardiology  5. Acute diastolic CHF (congestive heart failure) ([place] treatment [place]. - Ambulatory referral to Cardiology  6. Coronary artery disease involving native coronary artery of native heart without angina pectoris See above medical treatment plan. - Ambulatory referral to Cardiology  7. Nonrheumatic aortic valve  stenosis See above medical treatment plan. - Ambulatory referral to  Cardiology   No follow-ups on file.      Reynolds Bowl, PA-C, have reviewed all documentation for this visit. The documentation on 05/31/20 for the exam, diagnosis, procedures, and orders are all accurate and complete.   Rubye Beach  [facility] [phone] (phone) [phone] (fax)  [place]

## 2020-05-31 NOTE — Patient Instructions (Addendum)
[facility],  [place]  [id number] [address]: [phone]    Oral Thrush, Adult  Oral thrush, also called oral candidiasis, is a fungal infection that develops in the mouth and throat and on the tongue. It causes white patches to form on the mouth and tongue. [name] is most common in older adults, but it can occur at any age. Many cases of thrush are mild, but this infection can also be serious. [name] can be a repeated (recurrent) problem for certain people who have a weak body defense system (immune system). The weakness can be caused by chronic illnesses, or by taking medicines that limit the body's ability to fight infection. If a person has difficulty fighting infection, the fungus that causes thrush can spread through the body. This can cause life-threatening blood or organ infections. What are the causes? This condition is caused by a fungus (yeast) called Candida albicans.  This fungus is normally present in small amounts in the mouth and on other mucous membranes. It usually causes no harm.  If conditions are present that allow the fungus to grow without control, it invades surrounding tissues and becomes an infection.  Other Candida species can also lead to thrush (rare). What increases the risk? This condition is more likely to develop in:  People with a weakened immune system.  Older adults.  People with HIV (human immunodeficiency virus).  People with diabetes.  People with dry mouth (xerostomia).  Pregnant women.  People with poor dental care, especially people who have false teeth.  People who use antibiotic medicines. What are the signs or symptoms? Symptoms of this condition can vary from mild and moderate to severe and persistent. Symptoms may include:  A burning feeling in the mouth and throat. This can occur at the start of a thrush infection.  White patches that stick to the mouth and tongue. The  tissue around the patches may be red, raw, and painful. If rubbed (during tooth brushing, for example), the patches and the tissue of the mouth may bleed easily.  A bad taste in the mouth or difficulty tasting foods.  A cottony feeling in the mouth.  Pain during eating and swallowing.  Poor appetite.  Cracking at the corners of the mouth. How is this diagnosed? This condition is diagnosed based on:  Physical exam. Your health care provider will look in your mouth.  Health history. Your health care provider will ask you questions about your health. How is this treated? This condition is treated with medicines called antifungals, which prevent the growth of fungi. These medicines are either applied directly to the affected area (topical) or swallowed (oral). The treatment will depend on the severity of the condition. Mild thrush Mild cases of thrush may clear up with the use of an antifungal mouth rinse or lozenges. Treatment usually lasts about 14 days. Moderate to severe thrush  More severe thrush infections that have spread to the esophagus are treated with an oral antifungal medicine. A topical antifungal medicine may also be used.  For some severe infections, treatment may need to continue for more than 14 days.  Oral antifungal medicines are rarely used during pregnancy because they may be harmful to the unborn child. If you are pregnant, talk with your health care provider about options for treatment. Persistent or recurrent thrush For cases of thrush that do not go away or keep coming back:  Treatment may be needed twice as long as the  symptoms last.  Treatment will include both oral and topical antifungal medicines.  People with a weakened immune system can take an antifungal medicine on a continuous basis to prevent thrush infections. It is important to treat conditions that make a person more likely to get thrush, such as diabetes or HIV. Follow these instructions at  home: Medicines  Take over-the-counter and prescription medicines only as told by your health care provider.  Talk with your health care provider about an over-the-counter medicine called gentian violet, which kills bacteria and fungi. Relieving soreness and discomfort To help reduce the discomfort of thrush:  Drink cold liquids such as water or iced tea.  Try flavored ice treats or frozen juices.  Eat foods that are easy to swallow, such as gelatin, ice cream, or custard.  Try drinking from a straw if the patches in your mouth are painful.  General instructions  Eat plain, unflavored yogurt as directed by your health care provider. Check the label to make sure the yogurt contains live cultures. This yogurt can help healthy bacteria to grow in the mouth and can stop the growth of the fungus that causes thrush.  If you wear dentures, remove the dentures before going to bed, brush them vigorously, and soak them in a cleaning solution as directed by your health care provider.  Rinse your mouth with a warm salt-water mixture several times a day. To make a salt-water mixture, completely dissolve 1/2-1 tsp of salt in 1 cup of warm water. Contact a health care provider if:  Your symptoms are getting worse or are not improving within 7 days of starting treatment.  You have symptoms of a spreading infection, such as white patches on the skin outside of the mouth. This information is not intended to replace advice given to you by your health care provider. Make sure you discuss any questions you have with your health care provider. Document Revised: 10/01/2017 Document Reviewed: 03/23/2016 Elsevier Patient Education  [place].

## 2020-06-18 ENCOUNTER — Other Ambulatory Visit: Payer: Self-pay | Admitting: Physician Assistant

## 2020-06-18 DIAGNOSIS — E559 Vitamin D deficiency, unspecified: Secondary | ICD-10-CM

## 2020-06-18 DIAGNOSIS — I5032 Chronic diastolic (congestive) heart failure: Secondary | ICD-10-CM

## 2020-06-20 ENCOUNTER — Other Ambulatory Visit: Payer: Self-pay | Admitting: Physician Assistant

## 2020-06-20 DIAGNOSIS — IMO0002 Reserved for concepts with insufficient information to code with codable children: Secondary | ICD-10-CM

## 2020-06-20 DIAGNOSIS — E1165 Type 2 diabetes mellitus with hyperglycemia: Secondary | ICD-10-CM

## 2020-06-20 NOTE — Telephone Encounter (Signed)
Requested medication (s) are due for refill today: yes  Requested medication (s) are on the active medication list: no  Last refill:  11/06/19 discontinued stating reorder as reason now not on current med list  Future visit scheduled: yes  Notes to clinic:  Please review. I see no pen needles on active list but two pens that should require needle.    Requested Prescriptions  Pending Prescriptions Disp Refills   ULTICARE SHORT PEN NEEDLES 31G X 8 MM Keys [Pharmacy Med Name: ULTICARE PEN NEEDLES 8MM 31G] 100 each 0    Sig: USE THREE TIMES DAILY WITH INSULIN      Endocrinology: Diabetes - Testing Supplies Passed - 06/20/2020  4:06 PM      Passed - Valid encounter within last 12 months    Recent Outpatient Visits           2 weeks ago Esophageal dysphagia   [place], [place]   3 months ago Mouth pain   [facility] [place], Clearnce Sorrel, [place]   4 months ago [place], [place], [place]   7 months ago Diabetes mellitus type 2, uncontrolled, with complications [facility])   [place], [place]   10 months ago Chest pain, unspecified type   [place], [place]       Future Appointments             In 1 week Marlyn Corporal, [place], PA-C [employer], [place]   In 3 months Sherren Mocha, MD [place], [facility]

## 2020-06-25 IMAGING — CT CT ABD-PELV W/O CM
2 of 4 series · 13 of 36 positions shown, 16 images · non-contrast
Comparison: CT 02/16/2015 and abdominal CT 3365

CLINICAL DATA: 61-year-old with arterial embolization to the right
great toe. Evaluate for thoracic aortic aneurysm.

EXAM:
CT CHEST, ABDOMEN AND PELVIS WITHOUT CONTRAST
TECHNIQUE: Multidetector CT imaging of the chest, abdomen and pelvis was
performed following the standard protocol without IV contrast.

[Series 5: coronal · coronal · 0.92mm/px · 3 of 177 slices shown]
[im 36/177  lung]
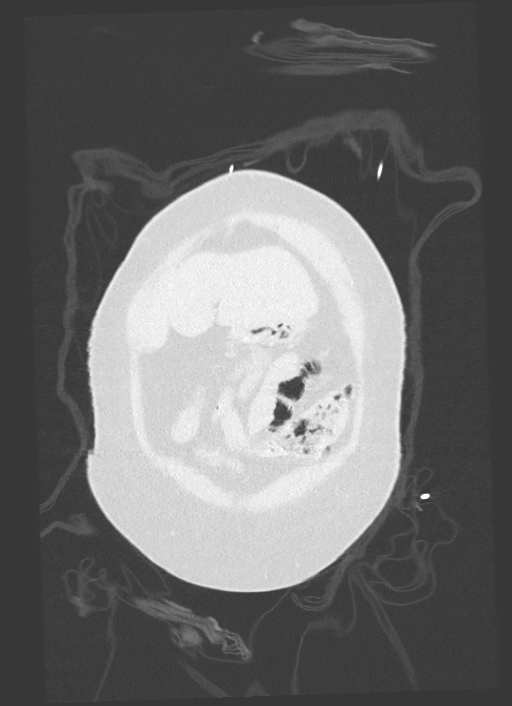
[im 71/177  lung]
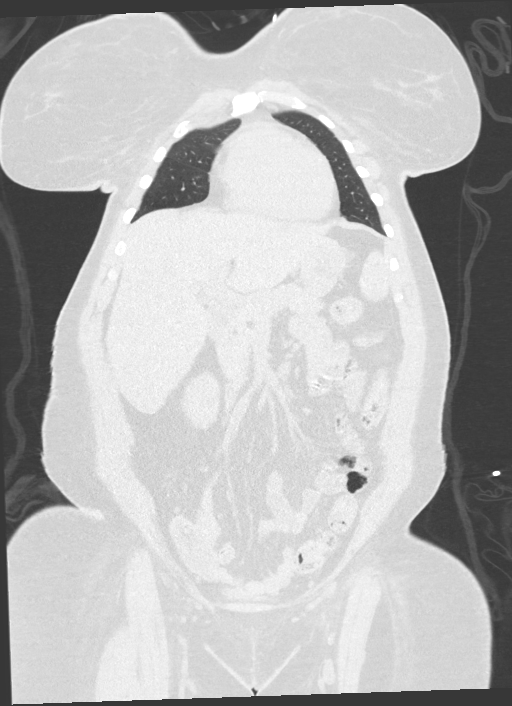
[im 106/177  lung]
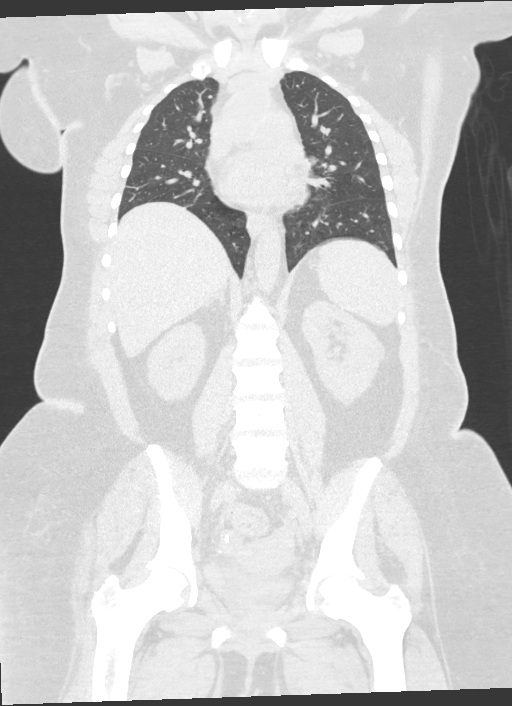

[Series 7: cap wo st · axial · 0.78mm/px · z∈[-633,-108]mm · 10 of 127 slices shown, 13 images]
[im 11/127  mediastinal]
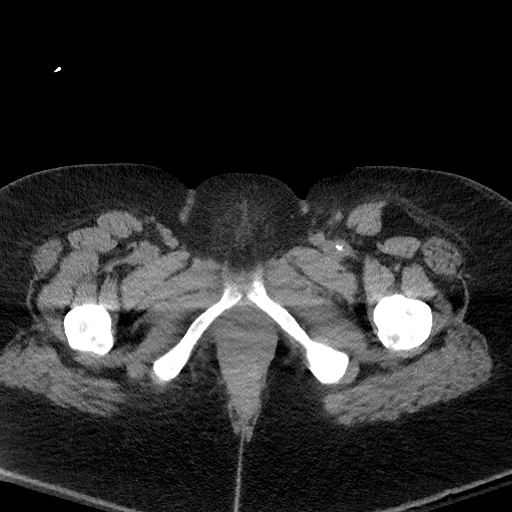
[im 11/127  lung]
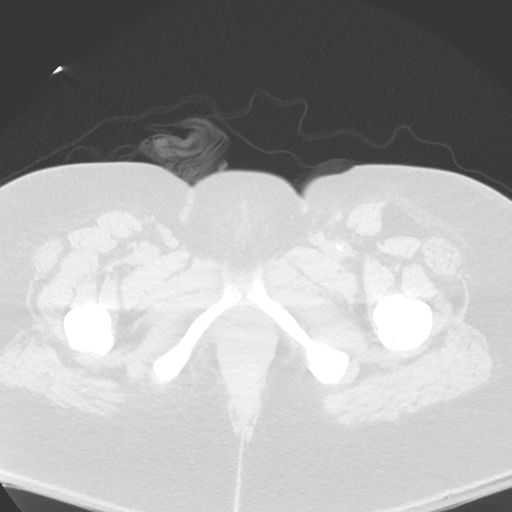
[im 22/127  lung]
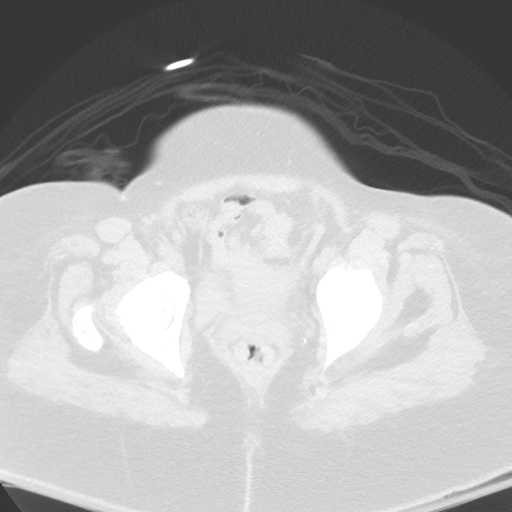
[im 32/127  lung]
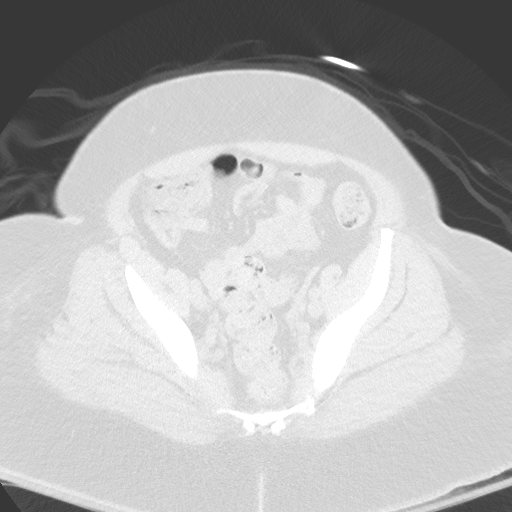
[im 43/127  lung]
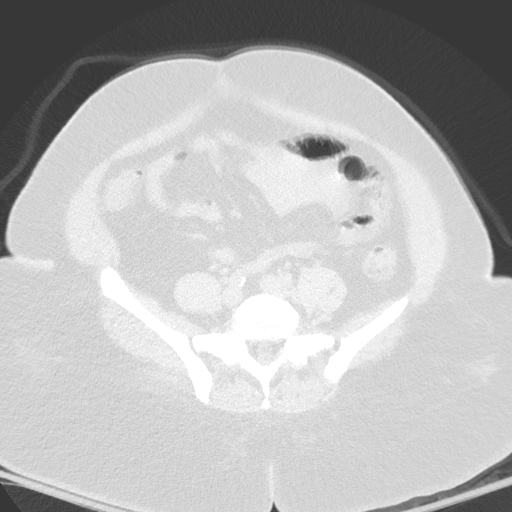
[im 53/127  mediastinal]
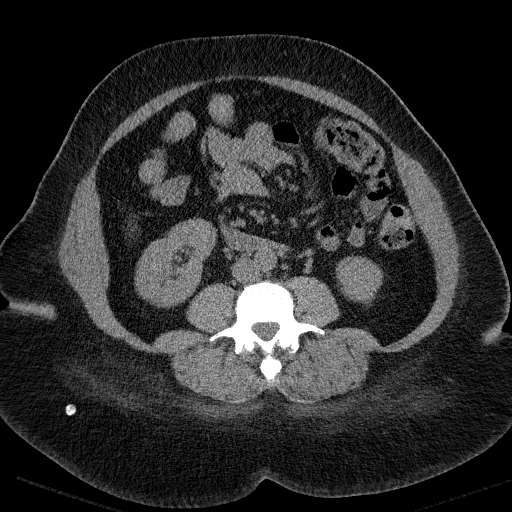
[im 53/127  lung]
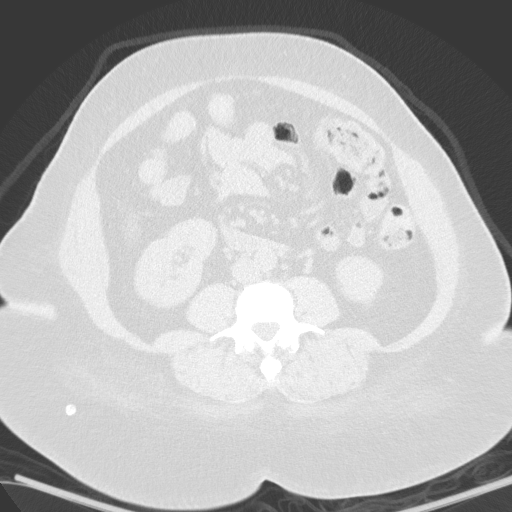
[im 74/127  lung]
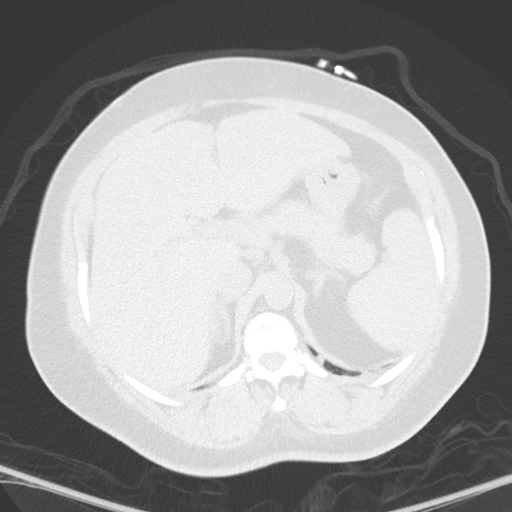
[im 85/127  lung]
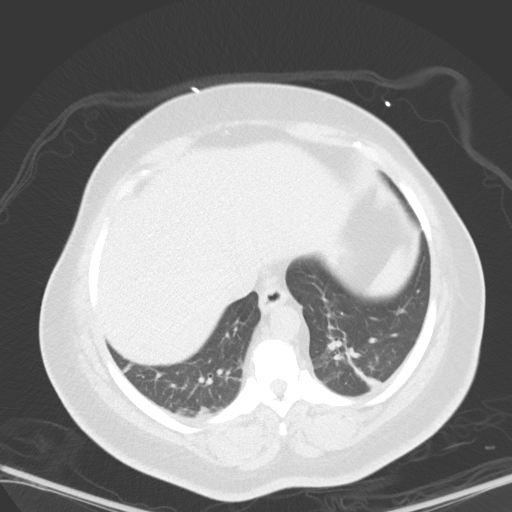
[im 95/127  lung]
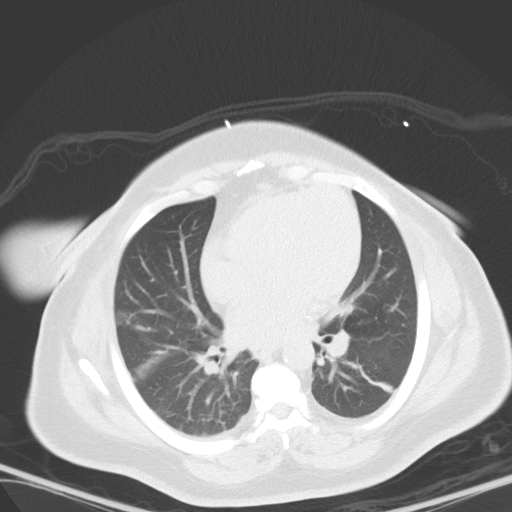
[im 106/127  mediastinal]
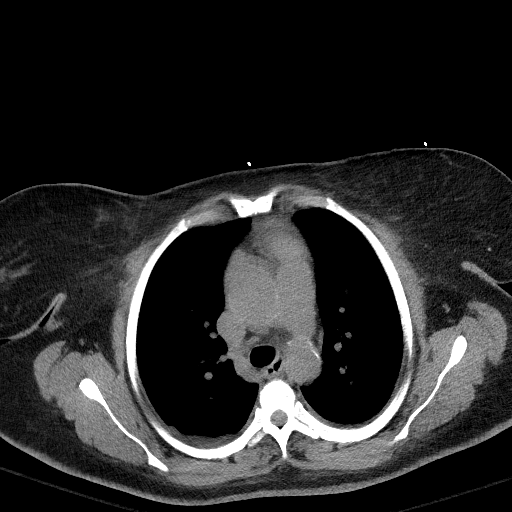
[im 106/127  lung]
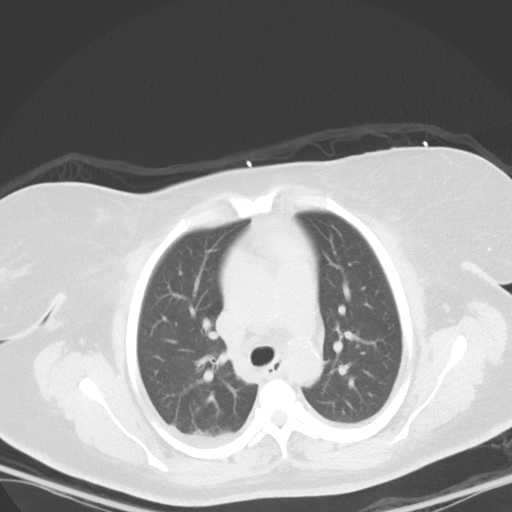
[im 116/127  lung]
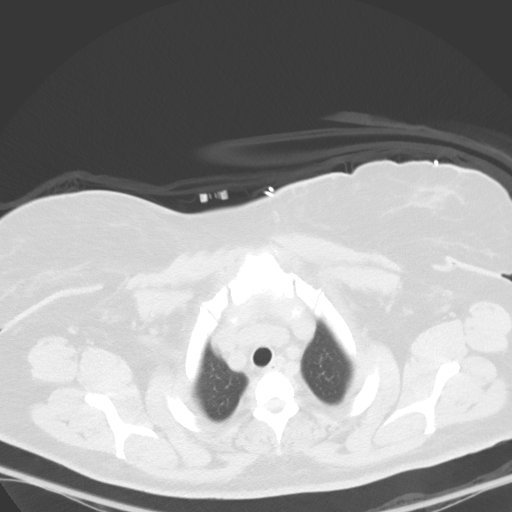

[13 of 36 positions shown; findings below may reference images not displayed]

FINDINGS: CT CHEST FINDINGS

Cardiovascular: Evaluation of the thoracic aorta is difficult due to
the lack of contrast and the configuration of the patient's heart
and aorta. The ascending thoracic aorta appears to measure roughly
3.9 cm and this is essentially unchanged from the exam in 9714.
Atherosclerotic calcifications involving the thoracic aorta. Patient
also has coronary artery calcifications. Proximal descending
thoracic aorta measures 3 cm. No significant pericardial fluid.

Mediastinum/Nodes: No evidence for chest lymphadenopathy but limited
evaluation of the hilar regions on this noncontrast examination. No
axillary lymph node enlargement.

Lungs/Pleura: Trachea and mainstem bronchi are patent. Stable poorly
defined nodular density in the periphery of the right upper lobe
roughly measuring 5 mm on sequence 4, image 47. Chronic
pleural-based densities along the right major fissure on sequence 4,
image 68. New peripheral densities in the posterior right upper lobe
sequence 4, image 59. Again noted are bandlike densities and
interstitial thickening in both lower lobes that are suggestive for
a combination of scarring and atelectasis. Stable pleural-based
density along the left major fissure on sequence 4, image 70. No
large pleural effusions. New posterior peripheral densities in the
left upper lobe are suggestive for atelectasis.

Musculoskeletal: Mild subcutaneous edema. No suspicious bone
findings. Sclerosis in the bones may be related to chronic kidney
disease.

CT ABDOMEN PELVIS FINDINGS

Hepatobiliary: No acute abnormality to the liver. Gallbladder has
been surgically removed. No significant biliary dilatation.

Pancreas: Small focus of low density near the pancreatic head
probably represents focal fat and this is adjacent to the portal
confluence. No evidence for pancreatic inflammation.

Spleen: Normal in size without focal abnormality.

Adrenals/Urinary Tract: Adrenal glands are normal. Negative for
hydronephrosis. Low-density structures in left kidney are suggestive
for renal cysts. Urinary bladder is decompressed.

Stomach/Bowel: Stomach is within normal limits. Appendix appears
normal. No evidence of bowel wall thickening, distention, or
inflammatory changes.

Vascular/Lymphatic: Normal caliber of the abdominal aorta with
atherosclerotic calcifications. Few atherosclerotic calcifications
in the iliac vessels without aneurysm. Atherosclerotic
calcifications in the proximal left femoral arteries. Again noted
are few prominent inguinal lymph nodes. No significant lymph node
enlargement in the abdomen or pelvis.

Reproductive: Limited evaluation of the uterus and adnexal
structures but suspect tubal ligation clips.

Other: No free fluid in the abdomen or pelvis. Negative for free
air.

Musculoskeletal: No acute abnormality. Subcutaneous edema in left
groin related to recent arterial catheterization. No evidence for
hematoma formation in this area. Evidence for a [employer] device
clip anterior to the left common femoral artery.
IMPRESSION: Ectasia of the ascending thoracic aorta measuring up to 3.9 cm and
no significant change since 9714. No aneurysm involving the
abdominal aorta or iliac arteries.

Aortic Atherosclerosis ([id number]).

Pleural-based densities in lungs are suggestive for areas of
atelectasis and scarring.

No acute abnormality in the abdomen or pelvis.

## 2020-06-28 ENCOUNTER — Ambulatory Visit (INDEPENDENT_AMBULATORY_CARE_PROVIDER_SITE_OTHER): Payer: Medicare Other | Admitting: Physician Assistant

## 2020-06-28 ENCOUNTER — Telehealth: Payer: Self-pay

## 2020-06-28 ENCOUNTER — Encounter: Payer: Self-pay | Admitting: Physician Assistant

## 2020-06-28 ENCOUNTER — Other Ambulatory Visit: Payer: Self-pay

## 2020-06-28 VITALS — BP 135/74 | HR 88 | Temp 98.5°F | Resp 16 | Ht 66.0 in | Wt 288.2 lb

## 2020-06-28 DIAGNOSIS — R87618 Other abnormal cytological findings on specimens from cervix uteri: Secondary | ICD-10-CM | POA: Diagnosis not present

## 2020-06-28 DIAGNOSIS — Z1239 Encounter for other screening for malignant neoplasm of breast: Secondary | ICD-10-CM

## 2020-06-28 DIAGNOSIS — G8929 Other chronic pain: Secondary | ICD-10-CM

## 2020-06-28 DIAGNOSIS — M25561 Pain in right knee: Secondary | ICD-10-CM

## 2020-06-28 DIAGNOSIS — B370 Candidal stomatitis: Secondary | ICD-10-CM

## 2020-06-28 DIAGNOSIS — M25562 Pain in left knee: Secondary | ICD-10-CM

## 2020-06-28 MED ORDER — NYSTATIN 100000 UNIT/ML MT SUSP: 5.0000 mL | Freq: Four times a day (QID) | OROMUCOSAL | 5 refills | Status: DC

## 2020-06-28 MED ORDER — OXYCODONE HCL 5 MG PO TABS: 5.0000 mg | ORAL_TABLET | Freq: Two times a day (BID) | ORAL | 0 refills | Status: DC | PRN

## 2020-06-28 MED ORDER — ALBUTEROL SULFATE HFA 108 (90 BASE) MCG/ACT IN AERS: 2.0000 | INHALATION_SPRAY | RESPIRATORY_TRACT | 5 refills | Status: AC | PRN

## 2020-06-28 NOTE — Progress Notes (Signed)
Established patient visit   Patient: [name]   DOB: [birth year]   64 y.o. Female  MRN: [id number] Visit Date: 06/28/2020  Today's healthcare provider: [name], PA-C   Chief Complaint  Patient presents with  . Follow-up   Subjective    HPI  Patient here to follow up chronic issues.  Esophageal dysphagia: per patient she is having stuff coming out her throat and always have "things" coming up her throat at any time. Has been referred to GI twice but patient has not gotten the call. Has also been seen in ER for same issue.   Also needs referral for GYN in [place].   Patient Active Problem List   Diagnosis Date Noted  . Allergy, unspecified, initial encounter 03/29/2020  . Anaphylactic shock, unspecified, initial encounter 03/29/2020  . Fluid overload 01/30/2020  . Pain in left upper arm 01/18/2020  . Cellulitis 01/16/2020  . Anemia due to end stage renal disease ([place]) 01/16/2020  . Diabetic polyneuropathy associated with type 2 diabetes mellitus ([place]) 11/06/2019  . Hemodialysis status ([place]) 11/06/2019  . Nystagmus 11/06/2019  . Intermittent claudication ([place]) 11/06/2019  . Vertigo of central origin 11/06/2019  . Weakness   . Hypervolemia associated with renal insufficiency 10/31/2019  . BMI 45.0-49.9, adult ([place]) 10/18/2019  . Ascending aortic aneurysm ([place]) 03/27/2019  . Puncture wound without foreign body of left forearm, initial encounter 01/02/2019  . Non-healing wound of upper extremity 12/28/2018  . Unspecified open wound of left upper arm, initial encounter 12/06/2018  . Complication from renal dialysis device 10/20/2018  . Iron deficiency anemia, unspecified 10/18/2018  . ESRD (end stage renal disease) on dialysis ([place]) 06/01/2018  . ARF (acute renal failure) ([place]) 05/09/2018  . Colon cancer screening   . LGSIL on Pap smear of cervix 01/27/2018  . Gout 12/25/2017  . AKI (acute kidney injury) ([place]) 12/24/2017  . Hyperglycemia 12/24/2017   . Chronic diastolic heart failure ([place]) 09/30/2017  . Lymphedema 09/30/2017  . Aortic stenosis   . Acute pain of both knees 02/12/2017  . Chronic gout due to renal impairment of multiple sites without tophus 02/12/2017  . Esophageal dysphagia 02/12/2017  . Osteoarthritis 01/22/2017  . Diabetic hyperosmolar non-ketotic state ([place]) 12/13/2016  . CAD (coronary artery disease) 12/13/2016  . Hyperlipidemia 12/13/2016  . Hyperphosphatemia 12/13/2016  . Acute diastolic CHF (congestive heart failure) ([place])   . Asthma 11/29/2015  . Depression 09/05/2015  . GERD (gastroesophageal reflux disease) 03/25/2015  . Environmental allergies 03/14/2015  . SOB (shortness of breath) 02/15/2015  . Chronic hepatitis C without hepatic coma ([place]) 01/31/2015  . Diabetic neuropathy ([place]) 01/31/2015  . OSA (obstructive sleep apnea) 01/01/2015  . Poor dentition 11/13/2014  . Essential hypertension 10/08/2014  . Morbid (severe) obesity due to excess calories ([place]) 10/08/2014  . Localized, primary osteoarthritis 01/25/2014  . Family history of diabetes mellitus 03/31/2013  . Pain in limb 03/31/2013  . Type 2 diabetes mellitus with ESRD (end-stage renal disease) ([place]) 03/25/2012    Class: Chronic   Past Medical History:  Diagnosis Date  . Anemia   . Aortic stenosis    Echo 8/18: mean 13, peak 28, LVOT/AV mean velocity 0.51  . Arthritis   . Asthma    As a child   . Bronchitis   . CAD (coronary artery disease)    a. 09/2016: 50% Ost 1st Mrg stenosis, 50% 2nd Mrg stenosis, 20% Mid-Cx, 95% Prox LAD, 40% mid-LAD, and 10% dist-LAD stenosis. Staged PCI  with DES to Prox-LAD.   [name] Chronic combined systolic and diastolic CHF (congestive heart failure) ([place]) 2011   echo 2/18: EF 55-60, normal wall motion, grade 2 diastolic dysfunction, trivial AI // echo 3/18: Septal and apical HK, EF 45-50, normal wall motion, trivial AI, mild LAE, PASP 38 // echo 8/18: EF 60-65, normal wall motion, grade 1 diastolic dysfunction,  calcified aortic valve leaflets, mild aortic stenosis (mean 13, peak 28, LVOT/AV mean velocity 0.51), mild AI, moderate MAC, mild LAE, trivial TR   . Chronic kidney disease    STAGE 4  . Chronic kidney disease on chronic dialysis ([facility])    t, th, sat  . Complication of anesthesia   . Depression   . Diabetes mellitus Dx 1989  . Elevated lipids   . GERD (gastroesophageal reflux disease)   . Gout   . Heart murmur    asymptomatic  . Hepatitis C Dx 2013  . Hypertension Dx 1989  . Infected surgical wound    Lt arm  . Myocardial infarction ([place]) 07/2015  . Obesity   . Pancreatitis 2013  . Pneumonia   . Refusal of blood transfusions as patient is Jehovah's Witness   . Tendinitis   . Tremors of nervous system    LEFT HAND  . Ulcer 2010       Medications: Outpatient Medications Prior to Visit  Medication Sig  . allopurinol (ZYLOPRIM) 100 MG tablet TAKE 1 TABLET BY MOUTH ONCE DAILY.  [name] aspirin EC 81 MG EC tablet Take 1 tablet (81 mg total) by mouth daily.  [name] atorvastatin (LIPITOR) 80 MG tablet TAKE 1 TABLET BY MOUTH ONCE DAILY AT 6PM  . AURYXIA 1 GM 210 MG(Fe) tablet Take 2 tablets (420 mg total) by mouth 3 (three) times daily with meals.  [name] b complex-vitamin c-folic acid (NEPHRO-VITE) 0.8 MG TABS tablet Take 1 tablet by mouth daily.   [name] buPROPion (WELLBUTRIN SR) 150 MG 12 hr tablet Take 1 tablet (150 mg total) by mouth 2 (two) times daily.  . carvedilol (COREG) 25 MG tablet TAKE (1) TABLET BY MOUTH TWICE A DAY WITH MEALS (BREAKFAST AND SUPPER)  . Cinacalcet HCl (SENSIPAR PO) Take 30 mg by mouth every dialysis.  . Continuous Blood Gluc Receiver (FREESTYLE LIBRE 14 DAY READER) DEVI To check blood sugar ACHS  . Continuous Blood Gluc Sensor (FREESTYLE LIBRE 14 DAY SENSOR) MISC To check blood sugar ACHS; change every 14 days  . cyclobenzaprine (FLEXERIL) 5 MG tablet Take 1 tablet (5 mg total) by mouth 3 (three) times daily as needed for muscle spasms.  [name] dicyclomine (BENTYL) 20 MG tablet  TAKE 1 TABLET(20 MG) BY MOUTH FOUR TIMES DAILY BEFORE MEALS AND AT BEDTIME (Patient taking differently: Take 20 mg by mouth 4 (four) times daily -  before meals and at bedtime.)  . docusate sodium (COLACE) 100 MG capsule Take 1 capsule (100 mg total) by mouth 2 (two) times daily as needed for mild constipation.  [name] doxepin (SINEQUAN) 10 MG capsule TAKE 1 CAPSULE(10 MG) BY MOUTH FOUR TIMES DAILY AS NEEDED FOR ITCHING  . DULERA 200-5 MCG/ACT AERO Inhale 2 puffs into the lungs 2 (two) times daily as needed.   . fluticasone (FLONASE) 50 MCG/ACT nasal spray Place 2 sprays into both nostrils daily as needed for allergies or rhinitis.  . fluticasone furoate-vilanterol (BREO ELLIPTA) 200-25 MCG/INH AEPB Inhale 1 puff into the lungs daily. (Patient taking differently: Inhale 1 puff into the lungs daily as needed.)  . gentamicin cream (  GARAMYCIN) 0.1 % Apply 1 application topically 2 (two) times daily. (Patient taking differently: Apply 1 application topically 2 (two) times daily as needed.)  . hydrALAZINE (APRESOLINE) 100 MG tablet TAKE (1) TABLET BY MOUTH THREE TIMES DAILY  . hydrOXYzine (ATARAX/VISTARIL) 25 MG tablet Take 1 tablet (25 mg total) by mouth 4 (four) times daily.  . insulin detemir (LEVEMIR FLEXTOUCH) 100 UNIT/ML FlexPen Inject 60 Units into the skin 2 (two) times daily.  . insulin lispro (HUMALOG KWIKPEN) 100 UNIT/ML KwikPen Inject 25 Units into the skin with breakfast, with lunch, and with evening meal.  . lidocaine-prilocaine (EMLA) cream Apply 1 application topically as needed.  [name] LOPERAMIDE HCL PO Take 1 tablet by mouth as needed.   . loratadine (CLARITIN) 10 MG tablet TAKE 1 TABLET BY MOUTH ONCE DAILY. (Patient taking differently: Take 10 mg by mouth at bedtime.)  . mometasone (ELOCON) 0.1 % cream APPLY AS DIRECTED TO AFFECTED AREA ONCE DAILY. (Patient taking differently: Apply 1 application topically daily as needed.)  . montelukast (SINGULAIR) 10 MG tablet Take 1 tablet (10 mg total) by  mouth at bedtime.  . nitroGLYCERIN (NITROSTAT) 0.4 MG SL tablet Place 1 tablet (0.4 mg total) under the tongue every 5 (five) minutes x 3 doses as needed for chest pain.  . Nutritional Supplements (FEEDING SUPPLEMENT, NEPRO CARB STEADY,) LIQD Take 237 mLs by mouth daily.  [name] nystatin cream (MYCOSTATIN) Apply 1 application topically 2 (two) times daily. (Patient taking differently: Apply 1 application topically daily as needed for dry skin.)  . omeprazole (PRILOSEC) 20 MG capsule TAKE (1) CAPSULE BY MOUTH ONCE DAILY.  [name] pregabalin (LYRICA) 150 MG capsule TAKE 1 CAPSULE(150 MG) BY MOUTH TWICE DAILY  . SENNA PLUS 8.6-50 MG tablet TAKE ONE TABLET BY MOUTH AT BEDTIME. (Patient taking differently: Take 1 tablet by mouth at bedtime as needed.)  . silver sulfADIAZINE (SILVADENE) 1 % cream Apply 1 application topically daily. Apply to right first toe (Patient taking differently: Apply 1 application topically daily as needed (pain).)  . torsemide (DEMADEX) 20 MG tablet TAKE (2) TABLETS BY MOUTH TWICE DAILY.  [name] triamcinolone cream (KENALOG) 0.1 % APPLY TO AFFECTED AREA TWICE DAILY (Patient taking differently: 2 (two) times daily as needed.)  . valACYclovir (VALTREX) 500 MG tablet TAKE (1) TABLET BY MOUTH EVERY OTHER DAY.  [name] VICTOZA 18 MG/3ML SOPN Start with 0.42m daily and increase by 0.656mper week until max dose of 1.8 mg dose achieved.  . Vitamin D, Ergocalciferol, (DRISDOL) 1.25 MG (50000 UNIT) CAPS capsule TAKE 1 CAPSULE BY MOUTH ONCE A MONTH  . [DISCONTINUED] oxyCODONE (ROXICODONE) 5 MG immediate release tablet Take 1 tablet (5 mg total) by mouth every 12 (twelve) hours as needed for severe pain.  . [DISCONTINUED] albuterol (VENTOLIN HFA) 108 (90 Base) MCG/ACT inhaler Inhale 2 puffs into the lungs every 4 (four) hours as needed for wheezing or shortness of breath.   No facility-administered medications prior to visit.    Review of Systems  Constitutional: Positive for fatigue.  HENT: Positive for  trouble swallowing.   Respiratory: Negative.   Cardiovascular: Negative.   Genitourinary: Negative.   Neurological: Negative.     Last CBC Lab Results  Component Value Date   WBC 7.0 01/31/2020   HGB 9.3 (L) 01/31/2020   HCT 27.7 (L) 01/31/2020   MCV 93.3 01/31/2020   MCH 31.3 01/31/2020   RDW 15.9 (H) 01/31/2020   PLT 150 0799/37/1696 Last metabolic panel Lab Results  Component Value Date  GLUCOSE 328 (H) 01/31/2020   NA 132 (L) 01/31/2020   K 4.2 01/31/2020   CL 93 (L) 01/31/2020   CO2 25 01/31/2020   BUN 59 (H) 01/31/2020   CREATININE 7.88 (H) 01/31/2020   GFRNONAA 5 (L) 01/31/2020   GFRAA 6 (L) 01/31/2020   CALCIUM 8.0 (L) 01/31/2020   PHOS 5.1 (H) 01/31/2020   PROT 7.6 01/31/2020   ALBUMIN 3.2 (L) 01/31/2020   LABGLOB 3.9 03/18/2017   AGRATIO 0.8 03/18/2017   BILITOT 0.8 01/31/2020   ALKPHOS 324 (H) 01/31/2020   AST 66 (H) 01/31/2020   ALT 86 (H) 01/31/2020   ANIONGAP 14 01/31/2020      Objective    BP 135/74 (BP Location: Right Arm, Patient Position: Sitting, Cuff Size: Large)   Pulse 88   Temp 98.5 F (36.9 C) (Oral)   Resp 16   Ht _0  (1.676 m)   Wt 288 lb 3.2 oz (130.7 kg)   BMI 46.52 kg/m  BP Readings from Last 3 Encounters:  06/28/20 135/74  05/31/20 (!) 108/57  05/15/20 (!) 148/99   Wt Readings from Last 3 Encounters:  06/28/20 288 lb 3.2 oz (130.7 kg)  05/31/20 285 lb (129.3 kg)  05/15/20 286 lb (129.7 kg)      Physical Exam Vitals reviewed.  Constitutional:      General: She is not in acute distress.    Appearance: Normal appearance. She is well-developed, well-groomed and well-nourished. She is obese. She is not ill-appearing or diaphoretic.  Neck:     Thyroid: No thyromegaly.     Vascular: No carotid bruit or JVD.     Trachea: No tracheal deviation.  Cardiovascular:     Rate and Rhythm: Normal rate and regular rhythm.     Heart sounds: Murmur heard.  No friction rub. No gallop.   Pulmonary:     Effort: Pulmonary effort  is normal. No respiratory distress.     Breath sounds: Normal breath sounds. No wheezing or rales.  Abdominal:     General: Abdomen is flat. Bowel sounds are normal.     Palpations: Abdomen is soft.     Tenderness: There is no abdominal tenderness.  Musculoskeletal:     Cervical back: Normal range of motion and neck supple. No tenderness.  Lymphadenopathy:     Cervical: No cervical adenopathy.  Skin:    General: Skin is warm and dry.  Neurological:     General: No focal deficit present.     Mental Status: Mental status is at baseline. She is lethargic.  Psychiatric:        Mood and Affect: Mood normal.        Behavior: Behavior is cooperative.        Thought Content: Thought content normal.      No results found for any visits on 06/28/20.  Assessment & Plan     1. Chronic pain of both knees Followed by Orthopedics. Will continue oxycodone as below for now. If patient is not a surgical candidate, may require referral to pain management for optimum treatment options.  - oxyCODONE (ROXICODONE) 5 MG immediate release tablet; Take 1 tablet (5 mg total) by mouth every 12 (twelve) hours as needed for severe pain.  Dispense: 60 tablet; Refill: 0  2. Pap smear abnormality of cervix/human papillomavirus (HPV) positive History of this. Due for follow up. GYN referral placed for [facility]. - Ambulatory referral to Gynecology  3. Encounter for breast cancer screening using non-mammogram modality There is  no family history of breast cancer. She does perform regular self breast exams. Mammogram was ordered as below. Information for [facility] clinic was given to patient so she may schedule her mammogram at her convenience. - MM 3D SCREEN BREAST BILATERAL; Future  4. Thrush Brexxafemme unaffordable. Nystatin will be continued as below. High risk for thrush with ESRD on dialysis, COPD on inhalers and T2DM.  - nystatin (MYCOSTATIN) 100000 UNIT/ML suspension; Take 5 mLs (500,000 Units  total) by mouth 4 (four) times daily.  Dispense: 473 mL; Refill: 5   No follow-ups on file.      Reynolds Bowl, PA-C, have reviewed all documentation for this visit. The documentation on 07/02/20 for the exam, diagnosis, procedures, and orders are all accurate and complete.   Rubye Beach  [facility] [phone] (phone) [phone] (fax)  [place]

## 2020-06-28 NOTE — Telephone Encounter (Signed)
Copied from [place] [phone]. Topic: General - Other >> Jun 28, 2020  3:57 PM Wynetta Emery, [place] C wrote: Reason for CRM: pt called in to update provider. Pt says that she was told by pharmacy that her medication oxyCODONE (ROXICODONE) 5 MG immediate release tablet is requiring a PA. Pt says that she is in pain and would like to know if this can be done as soon as possible.   Please assist pt further.

## 2020-06-28 NOTE — Patient Instructions (Addendum)
[facility]- Call to schedule for difficulty swallowing Address: [address] M[place]ene Speak Phone: [phone]  [facility] - Call to schedule for mammogram Address: [place], [place], [place] [address] Phone: [phone]

## 2020-07-02 ENCOUNTER — Encounter: Payer: Self-pay | Admitting: Physician Assistant

## 2020-07-02 NOTE — Telephone Encounter (Signed)
Pt calling stating that she is not able to pick this medication up without a PA to insurance. PT is requesting to have a call back once this is done. Please advise.

## 2020-07-02 NOTE — Telephone Encounter (Signed)
I have try since Friday and the PA is not going through with the insurance information that is on file.Will try once again tomorrow.

## 2020-07-03 NOTE — Telephone Encounter (Signed)
Prior Authorization done over the phone with an [employer] representative. He reports that it takes about 2-3 days to know the outcome on the PA.

## 2020-07-03 NOTE — Telephone Encounter (Signed)
A customer service agent from patient's insurance [employer] called with patient on the back line requesting an update on the prior authorization. The agent is requesting that our office contact the prior authorization team for [facility] at [phone] to find out what is needed. Patient needs her medications asap. Please call patient with update.

## 2020-07-08 ENCOUNTER — Other Ambulatory Visit: Payer: Self-pay | Admitting: Physician Assistant

## 2020-07-08 MED ORDER — ATORVASTATIN CALCIUM 80 MG PO TABS: ORAL_TABLET | ORAL | 3 refills | Status: DC

## 2020-07-12 ENCOUNTER — Other Ambulatory Visit: Payer: Self-pay | Admitting: Physician Assistant

## 2020-07-12 DIAGNOSIS — J9601 Acute respiratory failure with hypoxia: Secondary | ICD-10-CM

## 2020-07-12 DIAGNOSIS — G629 Polyneuropathy, unspecified: Secondary | ICD-10-CM

## 2020-07-12 DIAGNOSIS — K219 Gastro-esophageal reflux disease without esophagitis: Secondary | ICD-10-CM

## 2020-07-12 DIAGNOSIS — E559 Vitamin D deficiency, unspecified: Secondary | ICD-10-CM

## 2020-07-12 DIAGNOSIS — J301 Allergic rhinitis due to pollen: Secondary | ICD-10-CM

## 2020-07-12 DIAGNOSIS — I10 Essential (primary) hypertension: Secondary | ICD-10-CM

## 2020-07-15 NOTE — Telephone Encounter (Signed)
Requested medication (s) are due for refill today: no  Requested medication (s) are on the active medication list: yes  Last refill:  05/21/20  Future visit scheduled: yes  Notes to clinic:  pt wanting to change pharmacy, overdue lab work- med not delegated to RF/refuse   Requested Prescriptions  Pending Prescriptions Disp Refills   Vitamin D, Ergocalciferol, (DRISDOL) 1.25 MG (50000 UNIT) CAPS capsule [Pharmacy Med Name: VIT D2 ERGOCAL (50,000 UNIT)] 1 capsule     Sig: TAKE 1 CAPSULE BY MOUTH ONCE A MONTH      Endocrinology:  Vitamins - Vitamin D Supplementation Failed - 07/12/2020 12:41 PM      Failed - 50,000 IU strengths are not delegated      Failed - Ca in normal range and within 360 days    Calcium  Date Value Ref Range Status  01/31/2020 8.0 (L) 8.9 - 10.3 mg/dL Final   Calcium, Ion  Date Value Ref Range Status  09/14/2015 1.00 (L) 1.12 - 1.23 mmol/L Final          Failed - Phosphate in normal range and within 360 days    Phosphorus  Date Value Ref Range Status  01/31/2020 5.1 (H) 2.5 - 4.6 mg/dL Final          Failed - Vitamin D in normal range and within 360 days    No results found for: [id number], [id number], [id number], 25OHVITD3, [place], [place], [place], [place], 25OHVITD2, 25OHVITD3, VD25OH        Passed - Valid encounter within last 12 months    Recent Outpatient Visits           2 weeks ago Pap smear abnormality of cervix/human papillomavirus (HPV) positive   [place], [place], [place]   1 month ago Esophageal dysphagia   [place], [place], [place]   3 months ago Mouth pain   [place], [place], [place]   5 months ago [place], [place], PA-C   8 months ago Diabetes mellitus type 2, uncontrolled, with complications [facility])   [place], Clearnce Sorrel, [place]       Future Appointments              In 2 months Burnette, Clearnce Sorrel, PA-C [employer], [place]   In 2 months Sherren Mocha, MD [facility] [address] Office, [facility]              Signed Prescriptions Disp Refills   carvedilol (COREG) 25 MG tablet 180 tablet 2    Sig: TAKE (1) TABLET BY MOUTH TWICE A DAY WITH MEALS (BREAKFAST AND SUPPER)      Cardiovascular:  Beta Blockers Passed - 07/12/2020 12:41 PM      Passed - Last BP in normal range    BP Readings from Last 1 Encounters:  06/28/20 135/74          Passed - Last Heart Rate in normal range    Pulse Readings from Last 1 Encounters:  06/28/20 88          Passed - Valid encounter within last 6 months    Recent Outpatient Visits           2 weeks ago Pap smear abnormality of cervix/human papillomavirus (HPV) positive   [place], [place], [place]   1 month ago Esophageal dysphagia   [facility] [place], [place], [place]   3  months ago Mouth pain   [name] [place], [place]   5 months ago [place], [place], [place]   8 months ago Diabetes mellitus type 2, uncontrolled, with complications [facility])   [place], Clearnce Sorrel, [place]       Future Appointments             In 2 months Burnette, Clearnce Sorrel, PA-C [employer], [place]   In 2 months Sherren Mocha, MD [facility] Office, [facility]               montelukast (SINGULAIR) 10 MG tablet 90 tablet 3    Sig: TAKE ONE TABLET BY MOUTH AT BEDTIME.      Pulmonology:  Leukotriene Inhibitors Passed - 07/12/2020 12:41 PM      Passed - Valid encounter within last 12 months    Recent Outpatient Visits           2 weeks ago Pap smear abnormality of cervix/human papillomavirus (HPV) positive   [place], [place]   1 month ago Esophageal dysphagia   [place], [place]   3 months ago Mouth pain   [facility] [place], Clearnce Sorrel, [place]   5 months ago [place], [place], [place]   8 months ago Diabetes mellitus type 2, uncontrolled, with complications [facility])   [place], Clearnce Sorrel, [place]       Future Appointments             In 2 months Burnette, Clearnce Sorrel, PA-C [employer], [place]   In 2 months Sherren Mocha, MD [facility] Office, [facility]               omeprazole (PRILOSEC) 20 MG capsule 28 capsule 11    Sig: TAKE (1) CAPSULE BY MOUTH ONCE DAILY.      Gastroenterology: Proton Pump Inhibitors Passed - 07/12/2020 12:41 PM      Passed - Valid encounter within last 12 months    Recent Outpatient Visits           2 weeks ago Pap smear abnormality of cervix/human papillomavirus (HPV) positive   [place], [place], [place]   1 month ago Esophageal dysphagia   [facility] [place], [place], [place]   3 months ago Mouth pain   [facility] [name], [place]   5 months ago [place], [facility], [place]   8 months ago Diabetes mellitus type 2, uncontrolled, with complications [facility])   [place], Clearnce Sorrel, [place]       Future Appointments             In 2 months Burnette, Clearnce Sorrel, PA-C [employer], [place]   In 2 months Sherren Mocha, MD [facility] Office, [facility]               SENNA-PLUS 8.6-50 MG tablet 90 tablet 3    Sig: TAKE ONE TABLET BY MOUTH AT BEDTIME.      Over the Counter:  OTC Passed - 07/12/2020 12:41 PM      Passed - Valid encounter within last 12 months    Recent Outpatient Visits           2 weeks ago Pap smear abnormality of cervix/human  papillomavirus (HPV) positive   [place], [place]   1 month ago  Esophageal dysphagia   [place], [place]   3 months ago Mouth pain   [facility] [name], [place]   5 months ago [place], [place], [place]   8 months ago Diabetes mellitus type 2, uncontrolled, with complications [facility])   [place], Clearnce Sorrel, [place]       Future Appointments             In 2 months Burnette, Clearnce Sorrel, PA-C [employer], [place]   In 2 months Sherren Mocha, MD [facility] Office, [facility]               loratadine (CLARITIN) 10 MG tablet 90 tablet 3    Sig: TAKE 1 TABLET BY MOUTH ONCE DAILY.      Ear, Nose, and Throat:  Antihistamines Passed - 07/12/2020 12:41 PM      Passed - Valid encounter within last 12 months    Recent Outpatient Visits           2 weeks ago Pap smear abnormality of cervix/human papillomavirus (HPV) positive   [facility] [place], [place], [place]   1 month ago Esophageal dysphagia   [facility]  [place], [place], [place]   3 months ago Mouth pain   [facility] [name], [place]   5 months ago [place], [place], PA-C   8 months ago Diabetes mellitus type 2, uncontrolled, with complications [facility])   [place], Clearnce Sorrel, [place]       Future Appointments             In 2 months Burnette, Clearnce Sorrel, PA-C [employer], [place]   In 2 months Sherren Mocha, MD [facility] Office, [facility]               hydrALAZINE (APRESOLINE) 100 MG tablet 270 tablet 3    Sig: TAKE (1) TABLET BY MOUTH THREE TIMES DAILY      Cardiovascular:  Vasodilators Failed - 07/12/2020 12:41 PM      Failed - HCT in normal range and within 360 days    HCT  Date Value Ref Range Status  01/31/2020 27.7 (L) 36.0 - 46.0 % Final   Hematocrit  Date Value  Ref Range Status  01/10/2018 28.2 (L) 34.0 - 46.6 % Final          Failed - HGB in normal range and within 360 days    Hemoglobin  Date Value Ref Range Status  01/31/2020 9.3 (L) 12.0 - 15.0 g/dL Final  01/10/2018 8.9 (L) 11.1 - 15.9 g/dL Final          Failed - RBC in normal range and within 360 days    RBC  Date Value Ref Range Status  01/31/2020 2.97 (L) 3.87 - 5.11 MIL/uL Final          Passed - WBC in normal range and within 360 days    WBC  Date Value Ref Range Status  01/31/2020 7.0 4.0 - 10.5 K/uL Final          Passed - PLT in normal range and within 360 days    Platelets  Date Value Ref Range Status  01/31/2020 150 150 - 400 K/uL Final  01/10/2018  285 150 - 450 x10E3/uL Final          Passed - Last BP in normal range    BP Readings from Last 1 Encounters:  06/28/20 135/74          Passed - Valid encounter within last 12 months    Recent Outpatient Visits           2 weeks ago Pap smear abnormality of cervix/human papillomavirus (HPV) positive   [place], [place]   1 month ago Esophageal dysphagia   [place], [place]   3 months ago Mouth pain   [facility] [name], [place]   5 months ago [place], [place], [place]   8 months ago Diabetes mellitus type 2, uncontrolled, with complications [facility])   [place], Clearnce Sorrel, [place]       Future Appointments             In 2 months Burnette, Clearnce Sorrel, PA-C [employer], [place]   In 2 months Sherren Mocha, MD [facility], [facility]

## 2020-07-15 NOTE — Telephone Encounter (Signed)
Requested Prescriptions  Pending Prescriptions Disp Refills  . Vitamin D, Ergocalciferol, (DRISDOL) 1.25 MG (50000 UNIT) CAPS capsule [Pharmacy Med Name: VIT D2 ERGOCAL (50,000 UNIT)] 1 capsule     Sig: TAKE 1 CAPSULE BY MOUTH ONCE A MONTH     Endocrinology:  Vitamins - Vitamin D Supplementation Failed - 07/12/2020 12:41 PM      Failed - 50,000 IU strengths are not delegated      Failed - Ca in normal range and within 360 days    Calcium  Date Value Ref Range Status  01/31/2020 8.0 (L) 8.9 - 10.3 mg/dL Final   Calcium, Ion  Date Value Ref Range Status  09/14/2015 1.00 (L) 1.12 - 1.23 mmol/L Final         Failed - Phosphate in normal range and within 360 days    Phosphorus  Date Value Ref Range Status  01/31/2020 5.1 (H) 2.5 - 4.6 mg/dL Final         Failed - Vitamin D in normal range and within 360 days    No results found for: [id number], [id number], [id number], [place], [place], [place], [place], [place], 25OHVITD2, 25OHVITD3, VD25OH       Passed - Valid encounter within last 12 months    Recent Outpatient Visits          2 weeks ago Pap smear abnormality of cervix/human papillomavirus (HPV) positive   [place], [place], [place]   1 month ago Esophageal dysphagia   [place], [place], [place]   3 months ago Mouth pain   [place], [place], [place]   5 months ago [place], [place], [place]   8 months ago Diabetes mellitus type 2, uncontrolled, with complications [facility])   [place], Clearnce Sorrel, [place]      Future Appointments            In 2 months Burnette, Clearnce Sorrel, PA-C [employer], [place]   In 2 months Sherren Mocha, MD [place], [facility]           . carvedilol (COREG) 25 MG tablet [Pharmacy Med Name: CARVEDILOL 25 MG TABLET] 180 tablet 2    Sig: TAKE (1) TABLET BY MOUTH  TWICE A DAY WITH MEALS (BREAKFAST AND SUPPER)     Cardiovascular:  Beta Blockers Passed - 07/12/2020 12:41 PM      Passed - Last BP in normal range    BP Readings from Last 1 Encounters:  06/28/20 135/74         Passed - Last Heart Rate in normal range    Pulse Readings from Last 1 Encounters:  06/28/20 88         Passed - Valid encounter within last 6 months    Recent Outpatient Visits          2 weeks ago Pap smear abnormality of cervix/human papillomavirus (HPV) positive   [place], [place], [place]   1 month ago Esophageal dysphagia   [place], [place], [place]   3 months ago Mouth pain   [place], [place], [place]   5 months ago [place], [place], [place]   8 months ago Diabetes mellitus type 2, uncontrolled, with complications [facility])   [place], Clearnce Sorrel, [place]      Future Appointments  In 2 months Burnette, Clearnce Sorrel, PA-C [employer], [place]   In 2 months Sherren Mocha, MD [facility] [address] Office, [facility]           . montelukast (SINGULAIR) 10 MG tablet [Pharmacy Med Name: MONTELUKAST SOD 10 MG TABLET] 90 tablet 3    Sig: TAKE ONE TABLET BY MOUTH AT BEDTIME.     Pulmonology:  Leukotriene Inhibitors Passed - 07/12/2020 12:41 PM      Passed - Valid encounter within last 12 months    Recent Outpatient Visits          2 weeks ago Pap smear abnormality of cervix/human papillomavirus (HPV) positive   [place], [place], [place]   1 month ago Esophageal dysphagia   [place], [place]   3 months ago Mouth pain   [facility] [place], Clearnce Sorrel, [place]   5 months ago [place], [place], [place]   8 months ago Diabetes mellitus type 2, uncontrolled, with complications [facility])    [place], Clearnce Sorrel, [place]      Future Appointments            In 2 months Burnette, Clearnce Sorrel, PA-C [employer], [place]   In 2 months Sherren Mocha, MD [place], [facility]           . omeprazole (PRILOSEC) 20 MG capsule [Pharmacy Med Name: OMEPRAZOLE DR 20 MG CAPSULE] 28 capsule 11    Sig: TAKE (1) CAPSULE BY MOUTH ONCE DAILY.     Gastroenterology: Proton Pump Inhibitors Passed - 07/12/2020 12:41 PM      Passed - Valid encounter within last 12 months    Recent Outpatient Visits          2 weeks ago Pap smear abnormality of cervix/human papillomavirus (HPV) positive   [place], [place], [place]   1 month ago Esophageal dysphagia   [place], [place]   3 months ago Mouth pain   [facility] [name], [place]   5 months ago [place], [place], [place]   8 months ago Diabetes mellitus type 2, uncontrolled, with complications [facility])   [place], Clearnce Sorrel, [place]      Future Appointments            In 2 months Burnette, Clearnce Sorrel, PA-C [employer], [place]   In 2 months Sherren Mocha, MD [facility] [address] Office, [facility]           . SENNA-PLUS 8.6-50 MG tablet [Pharmacy Med Name: SENNA PLUS TABLET] 90 tablet 3    Sig: TAKE ONE TABLET BY MOUTH AT BEDTIME.     Over the Counter:  OTC Passed - 07/12/2020 12:41 PM      Passed - Valid encounter within last 12 months    Recent Outpatient Visits          2 weeks ago Pap smear abnormality of cervix/human papillomavirus (HPV) positive   [place], [place]   1 month ago Esophageal dysphagia   [place], [place]   3 months ago Mouth pain   [name] [place], [place]   5 months ago [place], [place]   8 months ago Diabetes mellitus  type 2, uncontrolled, with complications [facility])   [place], Clearnce Sorrel, [place]      Future Appointments            In 2 months Burnette, Clearnce Sorrel, PA-C [employer], [place]   In 2 months Sherren Mocha, MD [facility] [address] Office, [facility]           . loratadine (CLARITIN) 10 MG tablet [Pharmacy Med Name: LORATADINE 10 MG TABLET] 90 tablet 3    Sig: TAKE 1 TABLET BY MOUTH ONCE DAILY.     Ear, Nose, and Throat:  Antihistamines Passed - 07/12/2020 12:41 PM      Passed - Valid encounter within last 12 months    Recent Outpatient Visits          2 weeks ago Pap smear abnormality of cervix/human papillomavirus (HPV) positive   [facility] [place], [place], [place]   1 month ago Esophageal dysphagia   [facility] [place], [place], [place]   3 months ago Mouth pain   [facility] [name] [place], [place]   5 months ago [place], [place], PA-C   8 months ago Diabetes mellitus type 2, uncontrolled, with complications [facility])   [place], Clearnce Sorrel, [place]      Future Appointments            In 2 months Burnette, Clearnce Sorrel, PA-C [employer], [place]   In 2 months Sherren Mocha, MD [place], [facility]           . hydrALAZINE (APRESOLINE) 100 MG tablet [Pharmacy Med Name: HYDRALAZINE 100 MG TABLET] 270 tablet 3    Sig: TAKE (1) TABLET BY MOUTH THREE TIMES DAILY     Cardiovascular:  Vasodilators Failed - 07/12/2020 12:41 PM      Failed - HCT in normal range and within 360 days    HCT  Date Value Ref Range Status  01/31/2020 27.7 (L) 36.0 - 46.0 % Final   Hematocrit  Date Value Ref Range Status  01/10/2018 28.2 (L) 34.0 - 46.6 % Final         Failed - HGB in normal range and within 360 days     Hemoglobin  Date Value Ref Range Status  01/31/2020 9.3 (L) 12.0 - 15.0 g/dL Final  01/10/2018 8.9 (L) 11.1 - 15.9 g/dL Final         Failed - RBC in normal range and within 360 days    RBC  Date Value Ref Range Status  01/31/2020 2.97 (L) 3.87 - 5.11 MIL/uL Final         Passed - WBC in normal range and within 360 days    WBC  Date Value Ref Range Status  01/31/2020 7.0 4.0 - 10.5 K/uL Final         Passed - PLT in normal range and within 360 days    Platelets  Date Value Ref Range Status  01/31/2020 150 150 - 400 K/uL Final  01/10/2018 285 150 - 450 x10E3/uL Final         Passed - Last BP in normal range    BP Readings from Last 1 Encounters:  06/28/20 135/74         Passed - Valid encounter within last 12 months    Recent Outpatient Visits          2 weeks ago Pap smear abnormality  of cervix/human papillomavirus (HPV) positive   [facility] [place], [place], [place]   1 month ago Esophageal dysphagia   [place], [place]   3 months ago Mouth pain   [name] [place], [place]   5 months ago [place], [place], [place]   8 months ago Diabetes mellitus type 2, uncontrolled, with complications [facility])   [place], Clearnce Sorrel, [place]      Future Appointments            In 2 months Burnette, Clearnce Sorrel, PA-C [employer], [place]   In 2 months Sherren Mocha, MD [facility], [facility]

## 2020-07-17 MED ORDER — VITAMIN D (ERGOCALCIFEROL) 1.25 MG (50000 UNIT) PO CAPS: ORAL_CAPSULE | ORAL | 1 refills | Status: DC

## 2020-07-17 NOTE — Telephone Encounter (Signed)
Patient is no longer using [facility]. She is using [facility] on [address].   Please quit sending refills to [facility]

## 2020-08-02 ENCOUNTER — Other Ambulatory Visit: Payer: Self-pay | Admitting: Physician Assistant

## 2020-08-02 DIAGNOSIS — G8929 Other chronic pain: Secondary | ICD-10-CM

## 2020-08-02 DIAGNOSIS — M25561 Pain in right knee: Secondary | ICD-10-CM

## 2020-08-02 MED ORDER — OXYCODONE HCL 5 MG PO TABS: 5.0000 mg | ORAL_TABLET | Freq: Two times a day (BID) | ORAL | 0 refills | Status: DC | PRN

## 2020-08-02 NOTE — Telephone Encounter (Signed)
Medication:  oxyCODONE (ROXICODONE) 5 MG immediate release tablet [[id number]]   Has the patient contacted their pharmacy? Yes   (Agent: If yes, when and what did the pharmacy advise?) to call the office   Preferred Pharmacy (with phone number or street name):  [id number] [address] [place] AT [address]  [place], [place] [address]  Phone:  [phone] Fax:  [phone] Agent: Please be advised that RX refills may take up to 3 business days. We ask that you follow-up with your pharmacy.

## 2020-08-02 NOTE — Telephone Encounter (Signed)
Requested medication (s) are due for refill today:   Provider to determine  Requested medication (s) are on the active medication list:   Yes  Future visit scheduled:   Yes   Last ordered: 06/28/2020 #60, 0 refills  Clinic Note:  Non delegated refill per protocol.   Requested Prescriptions  Pending Prescriptions Disp Refills   oxyCODONE (ROXICODONE) 5 MG immediate release tablet 60 tablet 0    Sig: Take 1 tablet (5 mg total) by mouth every 12 (twelve) hours as needed for severe pain.      Not Delegated - Analgesics:  Opioid Agonists Failed - 08/02/2020 12:46 PM      Failed - This refill cannot be delegated      Failed - Urine Drug Screen completed in last 360 days      Passed - Valid encounter within last 6 months    Recent Outpatient Visits           1 month ago Pap smear abnormality of cervix/human papillomavirus (HPV) positive   [place], [place]   2 months ago Esophageal dysphagia   [place], [place]   4 months ago Mouth pain   [facility] [name], [place]   6 months ago [place], Jennifer M, [place]   9 months ago Diabetes mellitus type 2, uncontrolled, with complications [facility])   [place], Clearnce Sorrel, [place]       Future Appointments             In 1 month Burnette, Clearnce Sorrel, PA-C [employer], [place]   In 2 months Sherren Mocha, MD [facility], [facility]

## 2020-08-05 ENCOUNTER — Other Ambulatory Visit: Payer: Self-pay | Admitting: Physician Assistant

## 2020-08-05 ENCOUNTER — Telehealth: Payer: Self-pay

## 2020-08-05 NOTE — Progress Notes (Signed)
error 

## 2020-08-05 NOTE — Telephone Encounter (Signed)
Copied from [place] [id number]. Topic: General - Other >> Aug 02, 2020 12:40 PM Hinda Lenis D wrote: Pt need a call back / question about glucose meter was suppusto sedn to the paharmacy / please advise

## 2020-08-09 ENCOUNTER — Other Ambulatory Visit: Payer: Self-pay | Admitting: Physician Assistant

## 2020-08-09 DIAGNOSIS — E559 Vitamin D deficiency, unspecified: Secondary | ICD-10-CM

## 2020-08-09 NOTE — Telephone Encounter (Signed)
Requested medication (s) are due for refill today: no  Requested medication (s) are on the active medication list: yes  Last refill:  07/17/20 #3 with 1 refill  Future visit scheduled: yes  Notes to clinic:  Please review for refill. Refill not delegated per protocol    Requested Prescriptions  Pending Prescriptions Disp Refills   Vitamin D, Ergocalciferol, (DRISDOL) 1.25 MG (50000 UNIT) CAPS capsule [Pharmacy Med Name: VIT D2 ERGOCAL (50,000 UNIT)] 1 capsule 11    Sig: TAKE [place] A MONTH      Endocrinology:  Vitamins - Vitamin D Supplementation Failed - 08/09/2020  3:58 PM      Failed - 50,000 IU strengths are not delegated      Failed - Ca in normal range and within 360 days    Calcium  Date Value Ref Range Status  01/31/2020 8.0 (L) 8.9 - 10.3 mg/dL Final   Calcium, Ion  Date Value Ref Range Status  09/14/2015 1.00 (L) 1.12 - 1.23 mmol/L Final          Failed - Phosphate in normal range and within 360 days    Phosphorus  Date Value Ref Range Status  01/31/2020 5.1 (H) 2.5 - 4.6 mg/dL Final          Failed - Vitamin D in normal range and within 360 days    No results found for: [id number], [id number], [id number], 25OHVITD3, 25OHVITD2, 25OHVITD3, 25OHVITD2, 25OHVITD1, 25OHVITD2, 25OHVITD3, VD25OH        Passed - Valid encounter within last 12 months    Recent Outpatient Visits           1 month ago Pap smear abnormality of cervix/human papillomavirus (HPV) positive   [place], [place], [place]   2 months ago Esophageal dysphagia   [place], [place], [place]   4 months ago Mouth pain   [place], [place], [place]   6 months ago [place], Jennifer M, [place]   9 months ago Diabetes mellitus type 2, uncontrolled, with complications [facility])   [place], Clearnce Sorrel, [place]       Future Appointments              In 1 month Burnette, Clearnce Sorrel, PA-C [employer], [place]   In 1 month Sherren Mocha, MD [place], [facility]

## 2020-08-22 ENCOUNTER — Telehealth: Payer: Self-pay | Admitting: Physician Assistant

## 2020-08-22 NOTE — Telephone Encounter (Signed)
Donita called back in to follow up on verbal orders. Advised per below, approved per provider. Donita states that she no longer need additional information requested. She received information from the hospital for pt services.

## 2020-08-22 NOTE — Telephone Encounter (Signed)
Nursing verbal orders are ok

## 2020-08-22 NOTE — Telephone Encounter (Signed)
Charlynn Grimes, RN with [facility] orders Frequency 1 w 4,  [name] states that she does not have  wound care ordersno list of medications,  And is also needing a Discharge summary from the hospital.  Will call back with fax number. Can these be faxed  Please advise CB- [phone]

## 2020-08-25 ENCOUNTER — Encounter (INDEPENDENT_AMBULATORY_CARE_PROVIDER_SITE_OTHER): Payer: Self-pay

## 2020-08-26 ENCOUNTER — Telehealth: Payer: Self-pay

## 2020-08-26 IMAGING — CT CT ABD-PELV W/O CM
2 of 4 series · 17 of 46 positions shown, 19 images · non-contrast
Comparison: 03/08/2018 and 03/25/2012

CLINICAL DATA: Left-sided sharp chest pain beginning yesterday with
nausea. Diarrhea.

EXAM:
CT ABDOMEN AND PELVIS WITHOUT CONTRAST
TECHNIQUE: Multidetector CT imaging of the abdomen and pelvis was performed
following the standard protocol without IV contrast.

[Series 2: routine abd/pel wo · axial · 0.98mm/px · z∈[-1097,-702]mm · 14 of 87 slices shown, 16 images]
[im 4/87  soft-tissue]
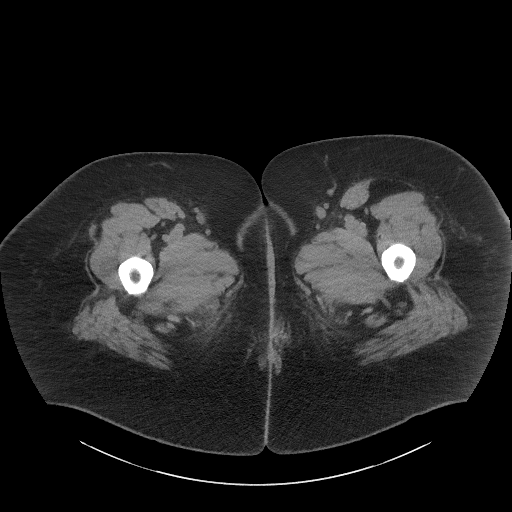
[im 4/87  bone]
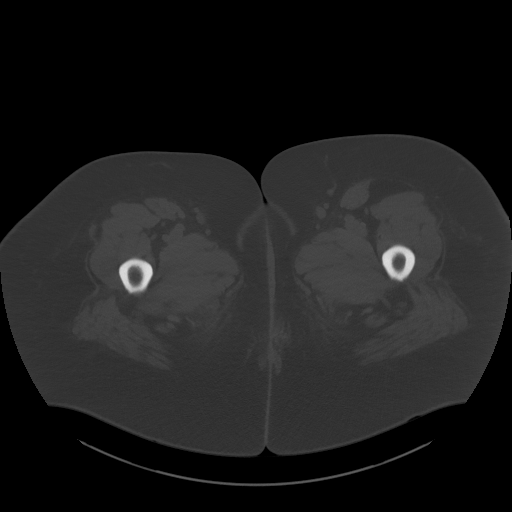
[im 11/87  soft-tissue]
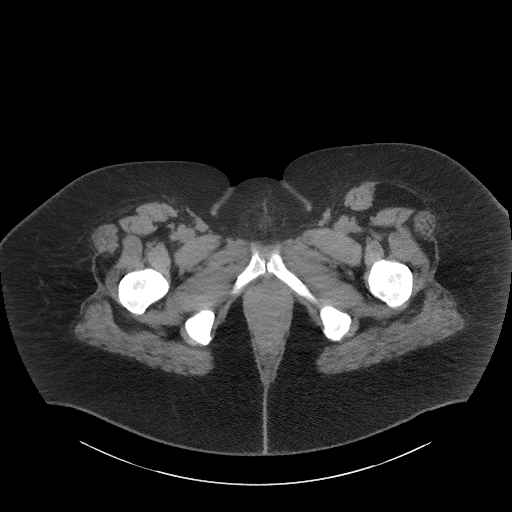
[im 18/87  soft-tissue]
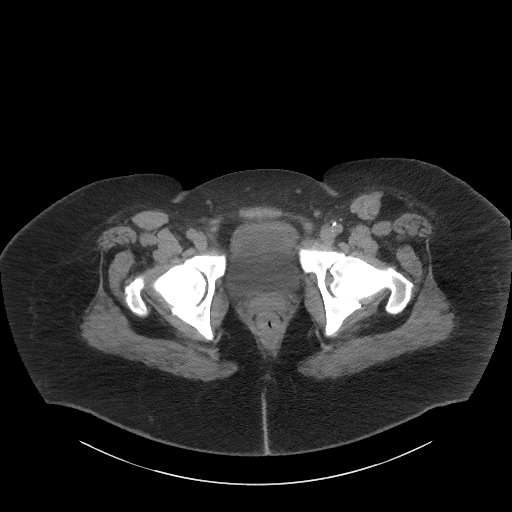
[im 22/87  soft-tissue]
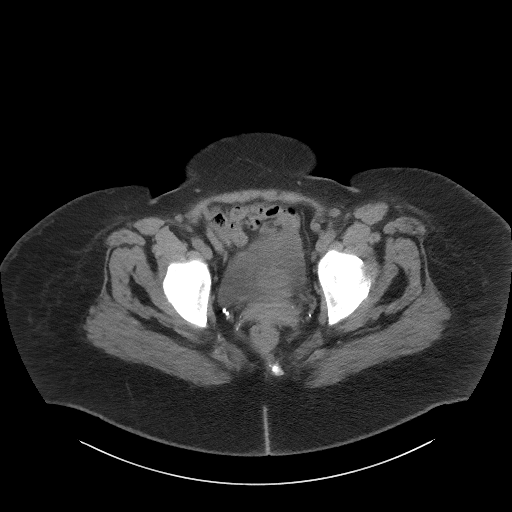
[im 29/87  soft-tissue]
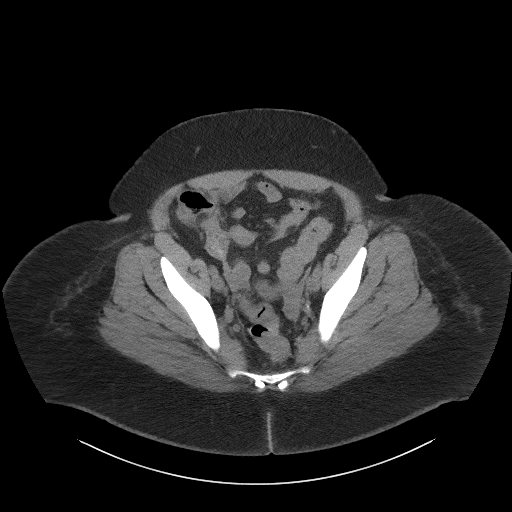
[im 36/87  soft-tissue]
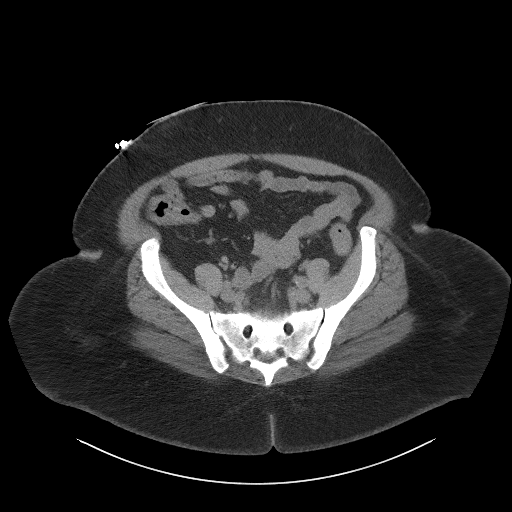
[im 40/87  soft-tissue]
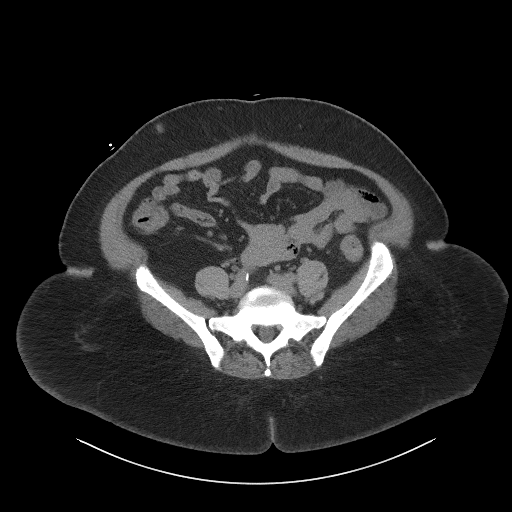
[im 47/87  soft-tissue]
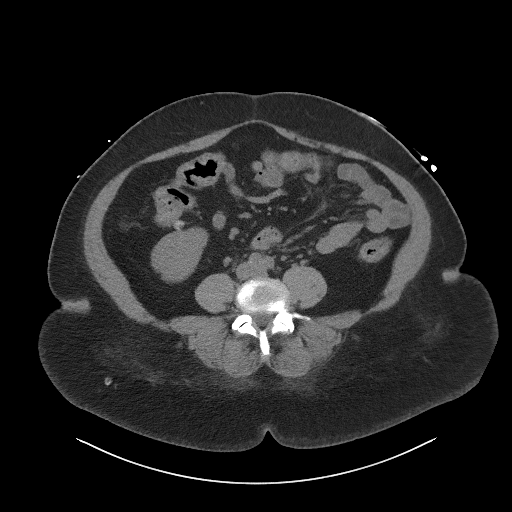
[im 51/87  soft-tissue]
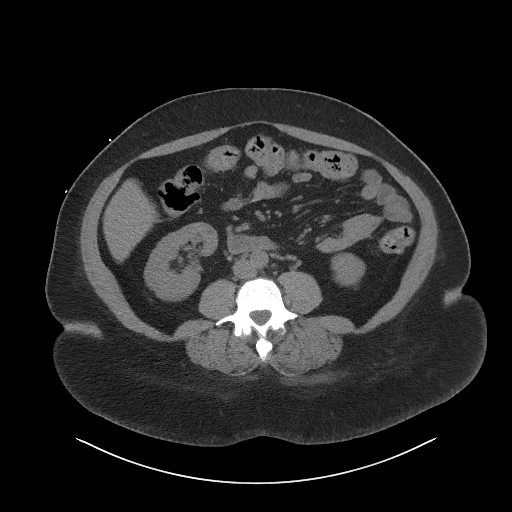
[im 51/87  bone]
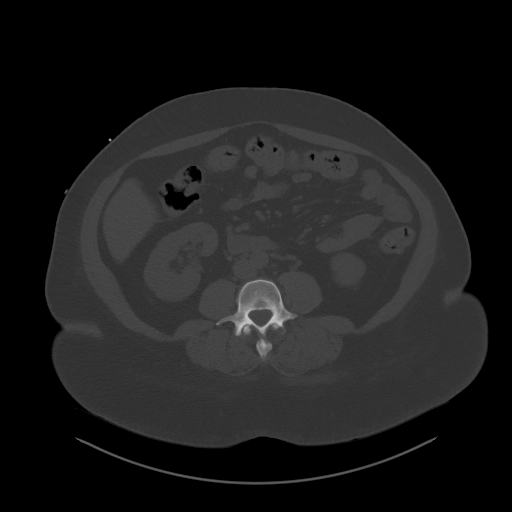
[im 58/87  soft-tissue]
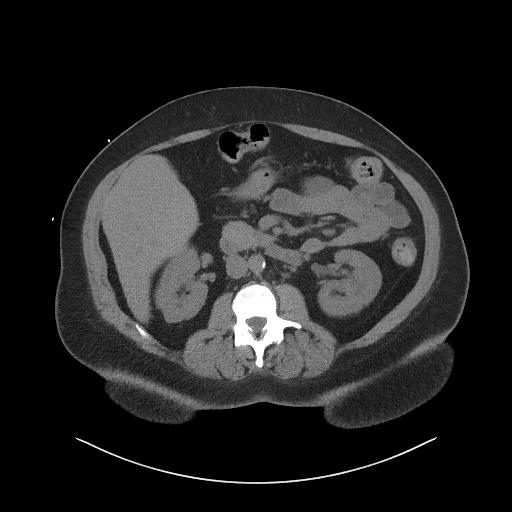
[im 65/87  soft-tissue]
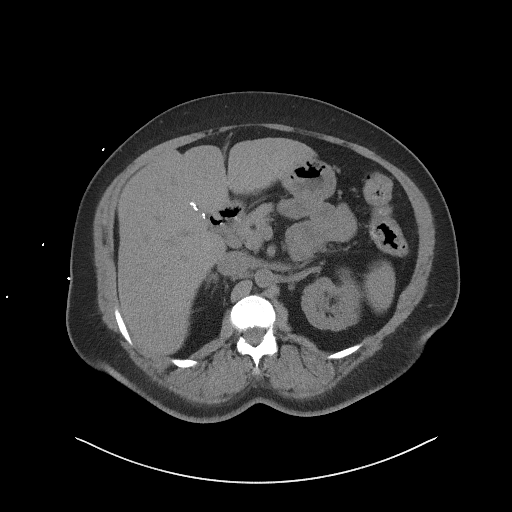
[im 69/87  soft-tissue]
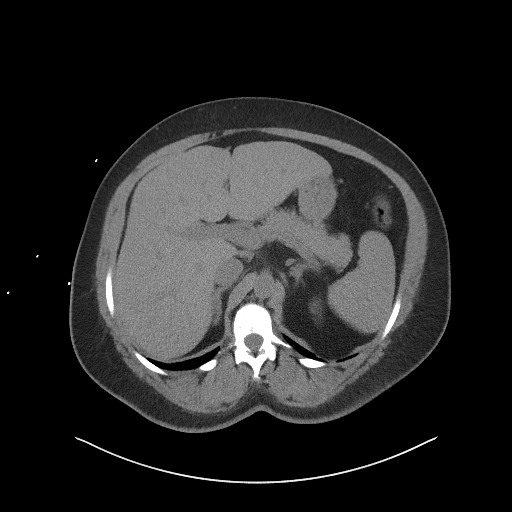
[im 76/87  soft-tissue]
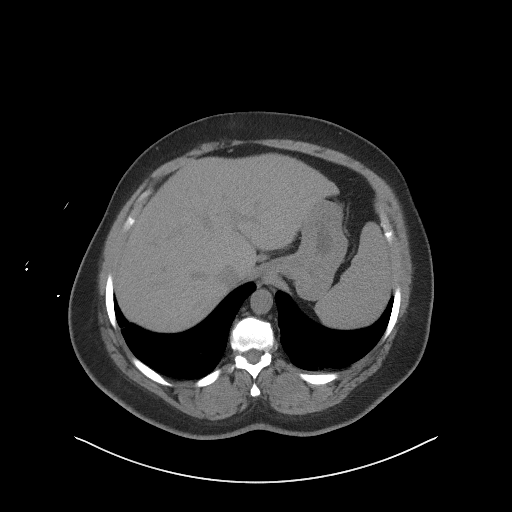
[im 83/87  soft-tissue]
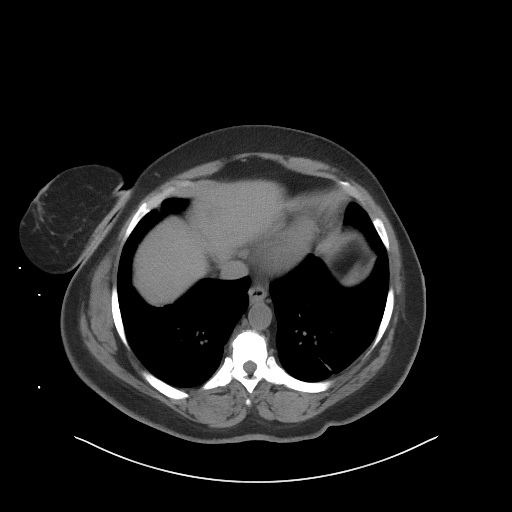

[Series 5: coronal st · coronal · 0.80mm/px · 3 of 117 slices shown]
[im 39/117  soft-tissue]
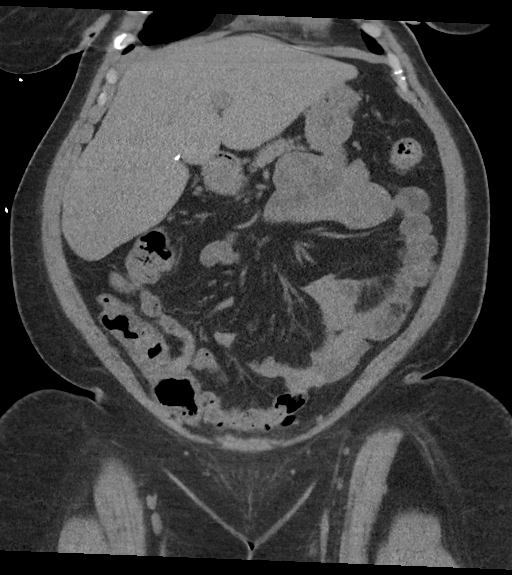
[im 52/117  soft-tissue]
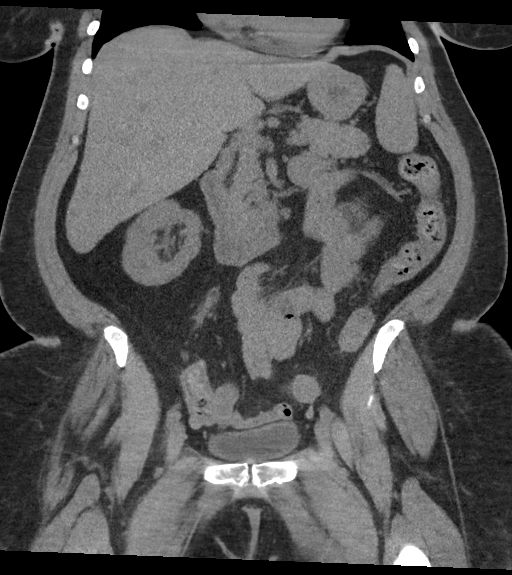
[im 65/117  soft-tissue]
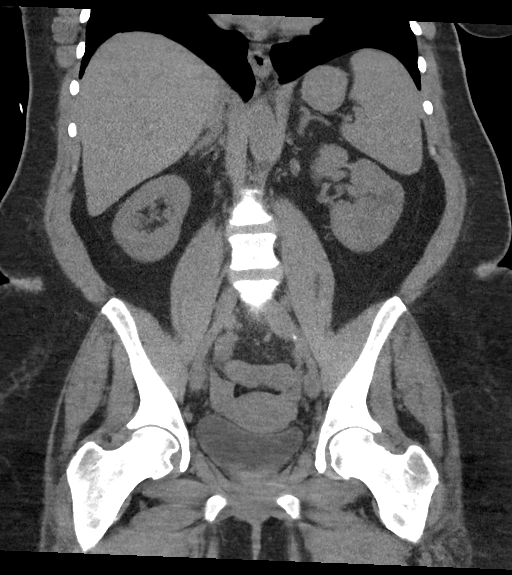

[17 of 46 positions shown; findings below may reference images not displayed]

FINDINGS: Lower chest: Minimal bibasilar scarring.

Hepatobiliary: Previous cholecystectomy. Liver and biliary tree are
otherwise within normal.

Pancreas: 5 mm lipoma over the pancreatic head.  No acute changes.

Spleen: Normal.

Adrenals/Urinary Tract: Adrenal glands are normal. Kidneys are
normal in size without hydronephrosis or nephrolithiasis. A few
small left renal cysts unchanged. Ureters and bladder are normal.

Stomach/Bowel: Stomach and small bowel are normal. Appendix is
normal. Minimal diverticulosis of the colon.

Vascular/Lymphatic: Minimal calcified plaque over the abdominal
aorta no adenopathy.

Reproductive: Normal. Evidence of previous bilateral tubal ligation.

Other: No free fluid or focal inflammatory change.

Musculoskeletal: Minimal degenerative change of the spine and hips.
IMPRESSION: No acute findings in the abdomen/pelvis.

Few small left renal cysts unchanged.

Mild colonic diverticulosis.

Aortic Atherosclerosis ([id number]).

## 2020-08-26 IMAGING — CR DG CHEST 2V
2 series · 2 of 2 positions shown · non-contrast
Comparison: CT chest March 08, 2018

CLINICAL DATA: LEFT chest pain and nausea since yesterday. History
of CHF.

EXAM:
CHEST - 2 VIEW

[chest lat]
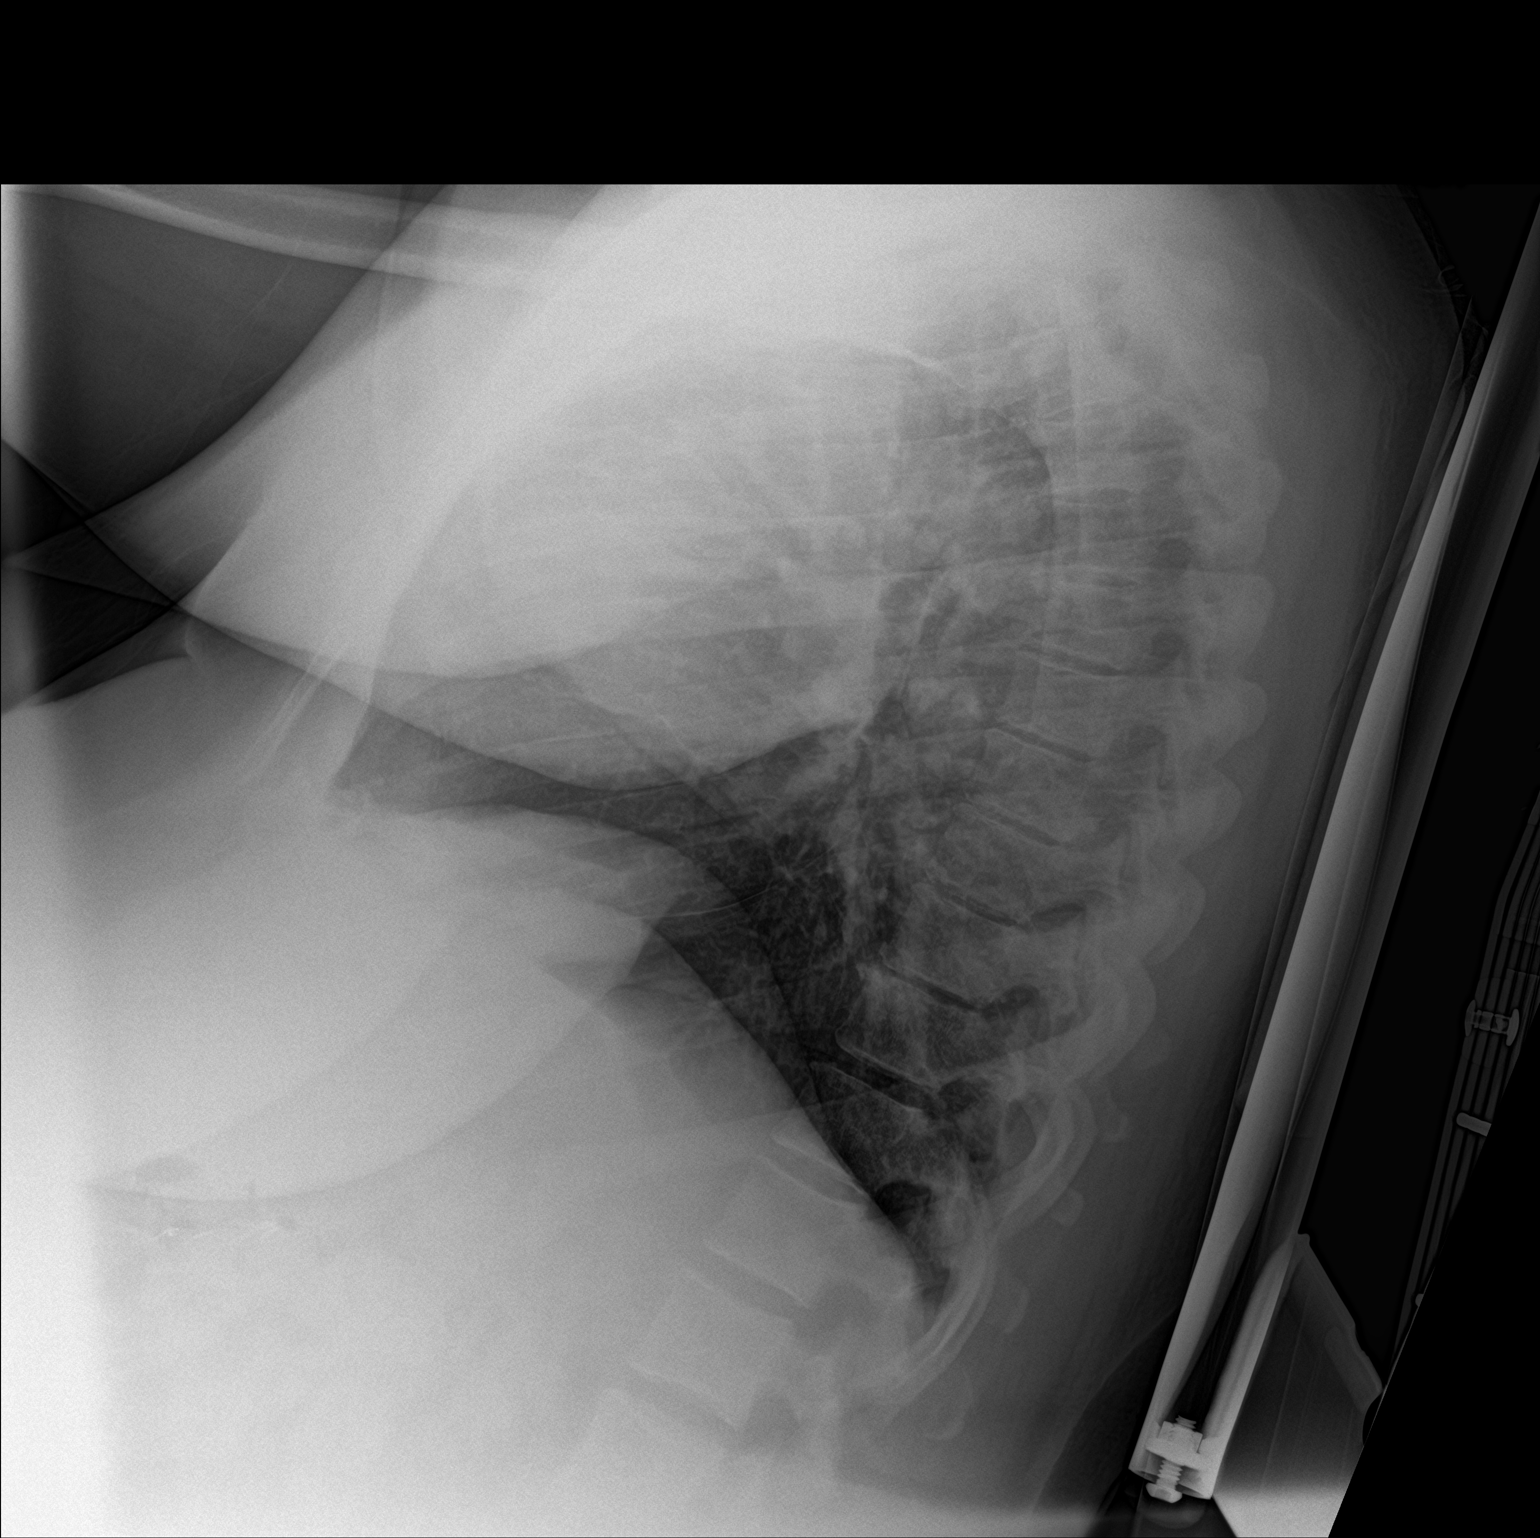

[chest ap]
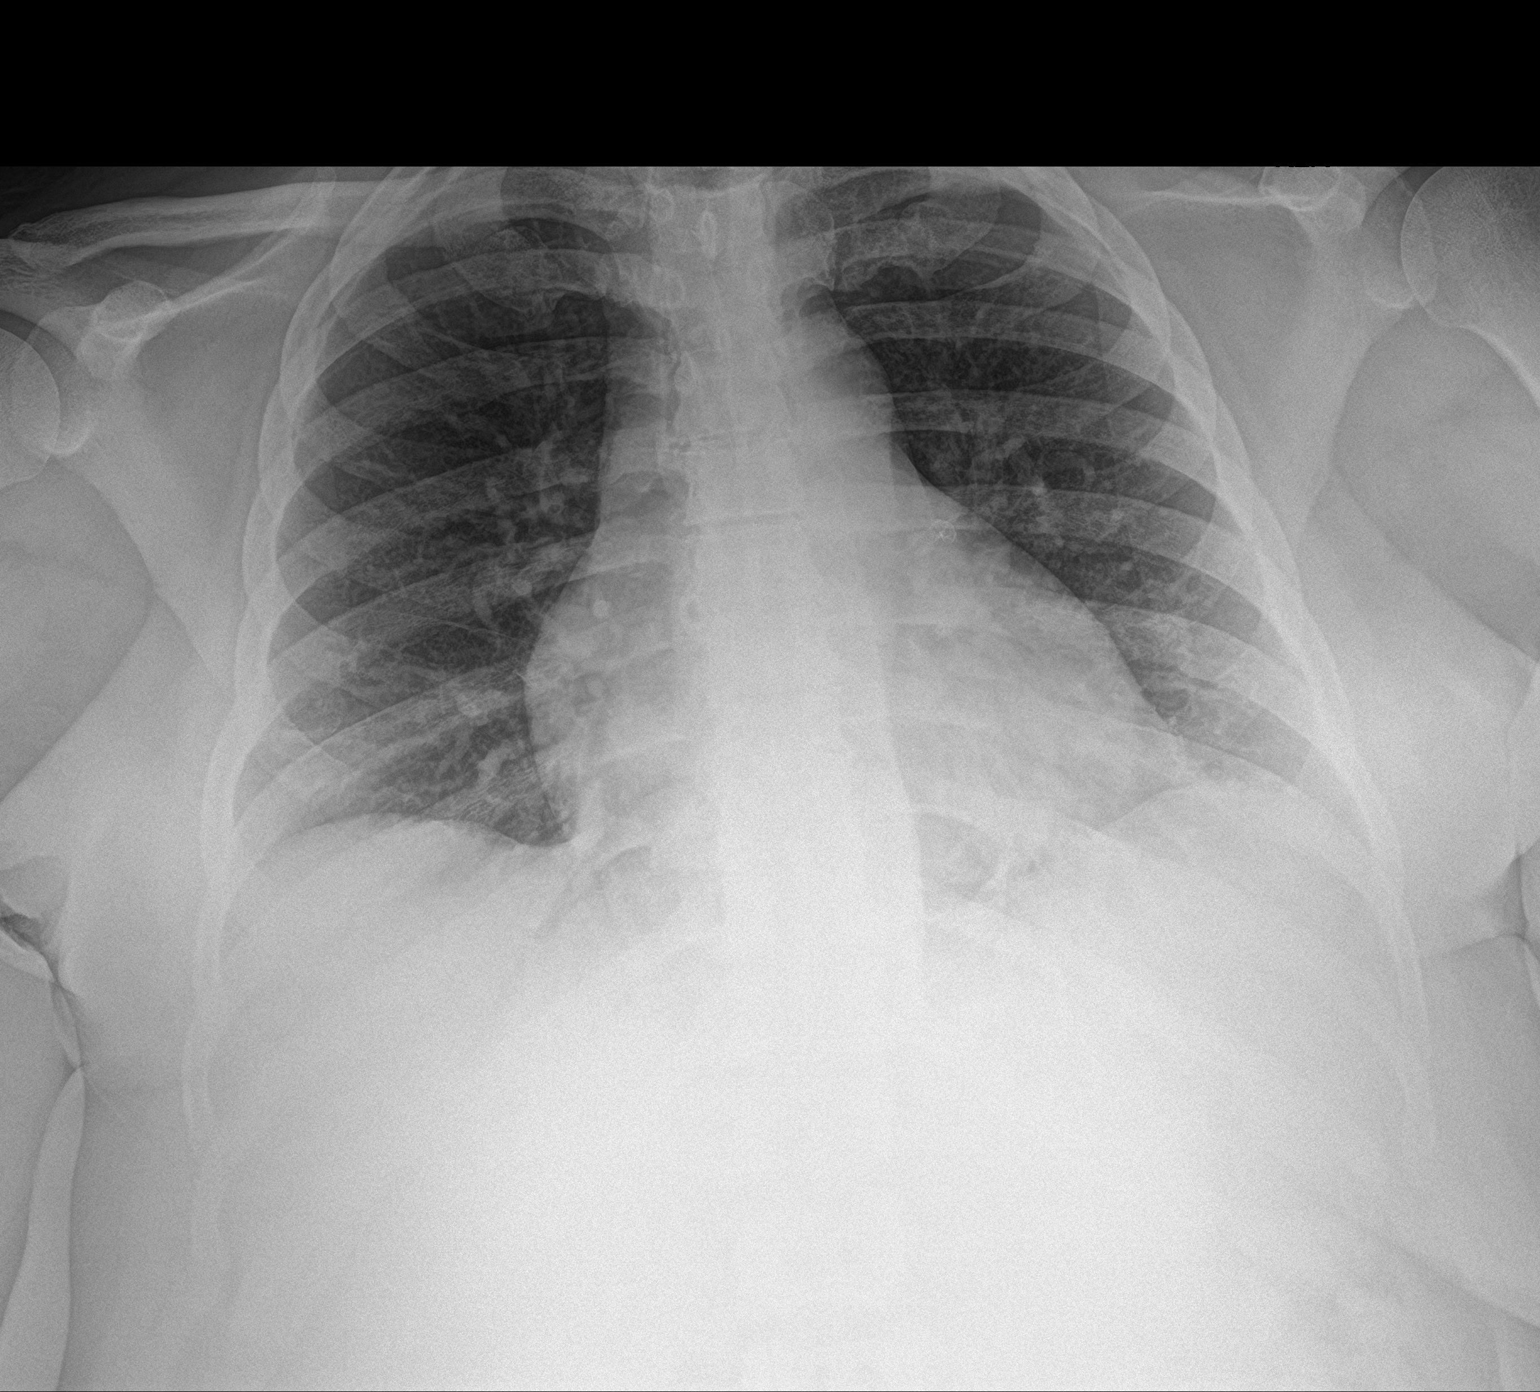

[2 of 2 positions shown; findings below may reference images not displayed]

FINDINGS: Cardiomediastinal silhouette is unremarkable for this low
inspiratory examination with crowded vasculature markings. Mild
bronchitic changes. The lungs are clear without pleural effusions or
focal consolidations. Trachea projects midline and there is no
pneumothorax. Included soft tissue planes and osseous structures are
non-suspicious.
IMPRESSION: Mild bronchitic changes.

## 2020-08-26 NOTE — Telephone Encounter (Signed)
Copied from [place] [id number]. Topic: General - Other >> Aug 26, 2020  8:37 AM Alanda Slim E wrote: Reason for CRM: Pt called and asked to speak with Jennifer/ Pt was released from hospital Wednesday night and Pt is wearing a colostomy bag and its spotting ad burning a little/ please advise asap

## 2020-08-27 NOTE — Telephone Encounter (Signed)
Pt needs an office visit.  Tried leaving her a message but her mailbox is full.  PEC please advise pt and schedule her an appointment if she calls back.   Thanks,   -Mickel Baas

## 2020-08-30 ENCOUNTER — Telehealth: Payer: Self-pay | Admitting: Physician Assistant

## 2020-08-30 ENCOUNTER — Telehealth: Payer: Self-pay

## 2020-08-30 ENCOUNTER — Other Ambulatory Visit: Payer: Self-pay | Admitting: Physician Assistant

## 2020-08-30 DIAGNOSIS — M25561 Pain in right knee: Secondary | ICD-10-CM

## 2020-08-30 DIAGNOSIS — G8929 Other chronic pain: Secondary | ICD-10-CM

## 2020-08-30 DIAGNOSIS — J45909 Unspecified asthma, uncomplicated: Secondary | ICD-10-CM

## 2020-08-30 MED ORDER — DAKINS (1/2 STRENGTH) 0.25 % EX SOLN: 1.0000 "application " | Freq: Once | CUTANEOUS | 0 refills | Status: AC

## 2020-08-30 MED ORDER — FLUTICASONE FUROATE-VILANTEROL 200-25 MCG/INH IN AEPB: 1.0000 | INHALATION_SPRAY | Freq: Every day | RESPIRATORY_TRACT | 0 refills | Status: AC

## 2020-08-30 MED ORDER — OXYCODONE HCL 5 MG PO TABS: 5.0000 mg | ORAL_TABLET | Freq: Two times a day (BID) | ORAL | 0 refills | Status: DC | PRN

## 2020-08-30 NOTE — Telephone Encounter (Signed)
Sharyn Lull with [facility] is calling in to request a Rx for [employer] for pt's wound. She says that pt was seen at the hospital but Rx wasn't sent in to pharmacy. Home health would like to know if provider would send in Rx to pt's local pharmacy to help?   Pharmacy:  [facility] [phone] [place], [place] - [place] Phone:  [phone]  Fax:  [phone]       Sharyn Lull: [phone]

## 2020-08-30 NOTE — Telephone Encounter (Signed)
Copied from [place] [phone]. Topic: Quick Communication - Home Health Verbal Orders >> Aug 30, 2020  4:06 PM Pawlus, Brayton Layman A wrote: Caller/Agency: [place] Number: [phone] Requesting: Speech therapy and PT

## 2020-08-30 NOTE — Telephone Encounter (Signed)
Speech therapy and PT are ok to start

## 2020-08-30 NOTE — Telephone Encounter (Signed)
Sent in

## 2020-08-30 NOTE — Telephone Encounter (Signed)
Requested medication (s) are due for refill today: yes  Requested medication (s) are on the active medication list: yes  Last refill:  08/03/19 #60 0 refills  Future visit scheduled: yes in 4 weeks  Notes to clinic:  not delegated per protocol     Requested Prescriptions  Pending Prescriptions Disp Refills   oxyCODONE (ROXICODONE) 5 MG immediate release tablet 60 tablet 0    Sig: Take 1 tablet (5 mg total) by mouth every 12 (twelve) hours as needed for severe pain.      Not Delegated - Analgesics:  Opioid Agonists Failed - 08/30/2020  3:27 PM      Failed - This refill cannot be delegated      Failed - Urine Drug Screen completed in last 360 days      Passed - Valid encounter within last 6 months    Recent Outpatient Visits           2 months ago Pap smear abnormality of cervix/human papillomavirus (HPV) positive   [facility] [place], [place], [place]   3 months ago Esophageal dysphagia   [place], [place], [place]   5 months ago Mouth pain   [facility] [name], [place]   7 months ago [place], Jennifer M, [place]   9 months ago Diabetes mellitus type 2, uncontrolled, with complications [facility])   [place], Clearnce Sor[place], [place]       Future Appointments             In 4 weeks Marlyn Corporal, Clearnce [place], PA-C [employer], [place]   In 1 month Sherren Mocha, MD [facility] [address], [facility]              Signed Prescriptions Disp Refills   fluticasone furoate-vilanterol (BREO ELLIPTA) 200-25 MCG/INH AEPB 60 each 0    Sig: Inhale 1 puff into the lungs daily.      Pulmonology:  Comb[employer] Products Passed - 08/30/2020  3:27 PM      Passed - Valid encounter within last 12 months    Recent Outpatient Visits           2 months ago Pap smear abnormality of cervix/human papillomavirus (HPV) positive   [place], [place]   3 months ago Esophageal dysphagia   [place], [place]   5 months ago Mouth pain   [name], [place]   7 months ago [place], Jennifer M, [place]   9 months ago Diabetes mellitus type 2, uncontrolled, with complications [facility])   [place], Clearnce Sorrel, [place]       Future Appointments             In 4 weeks Marlyn Corporal, Clearnce Sorrel, PA-C [employer], [place]   In 1 month Sherren Mocha, MD [facility], [facility]

## 2020-08-30 NOTE — Telephone Encounter (Signed)
Medication Refill - Medication: oxyCODONE (ROXICODONE) 5 MG immediate release tablet  And "[place]" Inhaler fluticasone furoate-vilanterol (BREO ELLIPTA) 200-25 MCG/INH AEPB    Has the patient contacted their pharmacy? Yes.   (Agent: If no, request that the patient contact the pharmacy for the refill.) (Agent: If yes, when and what did the pharmacy advise?)  Preferred Pharmacy (with phone number or street name):  [id number] [place], [place] [address]  [address] [place] [address]  Phone: [phone] Fax: [phone]     Agent: Please be advised that RX refills may take up to 3 business days. We ask that you follow-up with your pharmacy.

## 2020-08-30 NOTE — Telephone Encounter (Signed)
Future visit in 4 weeks  

## 2020-08-30 NOTE — Telephone Encounter (Signed)
Copied from [place] [phone]. Topic: Appointment Scheduling - Scheduling Inquiry for Clinic >> Aug 30, 2020  3:13 PM Erick Blinks wrote: Reason for CRM: Pt has just been discharged from the hospital, had double pneumonia and has failed the DT. Please advise virtual needed   Best contact: [phone]

## 2020-09-02 NOTE — Telephone Encounter (Signed)
Left message advising Claiborne Billings.   Thanks,   -Mickel Baas

## 2020-09-06 ENCOUNTER — Other Ambulatory Visit: Payer: Self-pay | Admitting: Physician Assistant

## 2020-09-06 DIAGNOSIS — I5032 Chronic diastolic (congestive) heart failure: Secondary | ICD-10-CM

## 2020-09-06 DIAGNOSIS — E559 Vitamin D deficiency, unspecified: Secondary | ICD-10-CM

## 2020-09-06 DIAGNOSIS — A6000 Herpesviral infection of urogenital system, unspecified: Secondary | ICD-10-CM

## 2020-09-12 ENCOUNTER — Telehealth: Payer: Medicare Other | Admitting: Physician Assistant

## 2020-09-12 ENCOUNTER — Telehealth: Payer: Self-pay

## 2020-09-12 NOTE — Progress Notes (Deleted)
MyChart Video Visit    Virtual Visit via Video Note   This visit type was conducted due to national recommendations for restrictions regarding the COVID-19 Pandemic (e.g. social distancing) in an effort to limit this patient's exposure and mitigate transmission in our community. This patient is at least at moderate risk for complications without adequate follow up. This format is felt to be most appropriate for this patient at this time. Physical exam was limited by quality of the video and audio technology used for the visit.   Patient location: *** Provider location: ***  I discussed the limitations of evaluation and management by telemedicine and the availability of in person appointments. The patient expressed understanding and agreed to proceed.  Patient: [name]   DOB: June 18, 1956   65 y.o. Female  MRN: [id number] Visit Date: 09/12/2020  Today's healthcare provider: Trinna Post, PA-C   No chief complaint on file.  Subjective    HPI  ***  {Show patient history (optional):23778::" "}  Medications: Outpatient Medications Prior to Visit  Medication Sig  . albuterol (VENTOLIN HFA) 108 (90 Base) MCG/ACT inhaler Inhale 2 puffs into the lungs every 4 (four) hours as needed for wheezing or shortness of breath.  . allopurinol (ZYLOPRIM) 100 MG tablet TAKE 1 TABLET BY MOUTH ONCE DAILY.  [name] aspirin EC 81 MG EC tablet Take 1 tablet (81 mg total) by mouth daily.  [name] atorvastatin (LIPITOR) 80 MG tablet TAKE 1 TABLET BY MOUTH ONCE DAILY AT 6PM  . AURYXIA 1 GM 210 MG(Fe) tablet Take 2 tablets (420 mg total) by mouth 3 (three) times daily with meals.  [name] b complex-vitamin c-folic acid (NEPHRO-VITE) 0.8 MG TABS tablet Take 1 tablet by mouth daily.   [name] buPROPion (WELLBUTRIN SR) 150 MG 12 hr tablet Take 1 tablet (150 mg total) by mouth 2 (two) times daily.  . carvedilol (COREG) 25 MG tablet TAKE (1) TABLET BY MOUTH TWICE A DAY WITH MEALS (BREAKFAST AND SUPPER)  . Cinacalcet HCl  (SENSIPAR PO) Take 30 mg by mouth every dialysis.  . Continuous Blood Gluc Receiver (FREESTYLE LIBRE 14 DAY READER) DEVI To check blood sugar ACHS  . Continuous Blood Gluc Sensor (FREESTYLE LIBRE 14 DAY SENSOR) MISC To check blood sugar ACHS; change every 14 days  . cyclobenzaprine (FLEXERIL) 5 MG tablet Take 1 tablet (5 mg total) by mouth 3 (three) times daily as needed for muscle spasms.  [name] dicyclomine (BENTYL) 20 MG tablet TAKE 1 TABLET(20 MG) BY MOUTH FOUR TIMES DAILY BEFORE MEALS AND AT BEDTIME (Patient taking differently: Take 20 mg by mouth 4 (four) times daily -  before meals and at bedtime.)  . docusate sodium (COLACE) 100 MG capsule Take 1 capsule (100 mg total) by mouth 2 (two) times daily as needed for mild constipation.  [name] doxepin (SINEQUAN) 10 MG capsule TAKE 1 CAPSULE(10 MG) BY MOUTH FOUR TIMES DAILY AS NEEDED FOR ITCHING  . DULERA 200-5 MCG/ACT AERO Inhale 2 puffs into the lungs 2 (two) times daily as needed.   . fluticasone (FLONASE) 50 MCG/ACT nasal spray Place 2 sprays into both nostrils daily as needed for allergies or rhinitis.  . fluticasone furoate-vilanterol (BREO ELLIPTA) 200-25 MCG/INH AEPB Inhale 1 puff into the lungs daily.  [name] gentamicin cream (GARAMYCIN) 0.1 % Apply 1 application topically 2 (two) times daily. (Patient taking differently: Apply 1 application topically 2 (two) times daily as needed.)  . hydrALAZINE (APRESOLINE) 100 MG tablet TAKE (1) TABLET BY MOUTH THREE TIMES  DAILY  . hydrOXYzine (ATARAX/VISTARIL) 25 MG tablet Take 1 tablet (25 mg total) by mouth 4 (four) times daily.  . insulin detemir (LEVEMIR FLEXTOUCH) 100 UNIT/ML FlexPen Inject 60 Units into the skin 2 (two) times daily.  . insulin lispro (HUMALOG KWIKPEN) 100 UNIT/ML KwikPen Inject 25 Units into the skin with breakfast, with lunch, and with evening meal.  . lidocaine-prilocaine (EMLA) cream Apply 1 application topically as needed.  [name] LOPERAMIDE HCL PO Take 1 tablet by mouth as needed.   .  loratadine (CLARITIN) 10 MG tablet TAKE 1 TABLET BY MOUTH ONCE DAILY.  . mometasone (ELOCON) 0.1 % cream APPLY AS DIRECTED TO AFFECTED AREA ONCE DAILY. (Patient taking differently: Apply 1 application topically daily as needed.)  . montelukast (SINGULAIR) 10 MG tablet TAKE ONE TABLET BY MOUTH AT BEDTIME.  . nitroGLYCERIN (NITROSTAT) 0.4 MG SL tablet Place 1 tablet (0.4 mg total) under the tongue every 5 (five) minutes x 3 doses as needed for chest pain.  . Nutritional Supplements (FEEDING SUPPLEMENT, NEPRO CARB STEADY,) LIQD Take 237 mLs by mouth daily.  [name] nystatin (MYCOSTATIN) 100000 UNIT/ML suspension Take 5 mLs (500,000 Units total) by mouth 4 (four) times daily.  [name] nystatin cream (MYCOSTATIN) Apply 1 application topically 2 (two) times daily. (Patient taking differently: Apply 1 application topically daily as needed for dry skin.)  . omeprazole (PRILOSEC) 20 MG capsule TAKE (1) CAPSULE BY MOUTH ONCE DAILY.  [name] oxyCODONE (ROXICODONE) 5 MG immediate release tablet Take 1 tablet (5 mg total) by mouth every 12 (twelve) hours as needed for severe pain.  . pregabalin (LYRICA) 150 MG capsule TAKE 1 CAPSULE(150 MG) BY MOUTH TWICE DAILY  . SENNA-PLUS 8.6-50 MG tablet TAKE ONE TABLET BY MOUTH AT BEDTIME.  . silver sulfADIAZINE (SILVADENE) 1 % cream Apply 1 application topically daily. Apply to right first toe (Patient taking differently: Apply 1 application topically daily as needed (pain).)  . torsemide (DEMADEX) 20 MG tablet TAKE (2) TABLETS BY MOUTH TWICE DAILY.  [name] triamcinolone cream (KENALOG) 0.1 % APPLY TO AFFECTED AREA TWICE DAILY (Patient taking differently: 2 (two) times daily as needed.)  . valACYclovir (VALTREX) 500 MG tablet TAKE (1) TABLET BY MOUTH EVERY OTHER DAY.  [name] VICTOZA 18 MG/3ML SOPN Start with 0.6mg  daily and increase by 0.6mg  per week until max dose of 1.8 mg dose achieved.  . Vitamin D, Ergocalciferol, (DRISDOL) 1.25 MG (50000 UNIT) CAPS capsule TAKE 1 CAPSULE BY MOUTH ONCE A MONTH    No facility-administered medications prior to visit.    Review of Systems  {Labs  Heme  Chem  Endocrine  Serology  Results Review (optional):23779::" "}  Objective    There were no vitals taken for this visit. {Show previous vital signs (optional):23777::" "}  Physical Exam     Assessment & Plan     ***  No follow-ups on file.     I discussed the assessment and treatment plan with the patient. The patient was provided an opportunity to ask questions and all were answered. The patient agreed with the plan and demonstrated an understanding of the instructions.   The patient was advised to call back or seek an in-person evaluation if the symptoms worsen or if the condition fails to improve as anticipated.  I provided *** minutes of non-face-to-face time during this encounter.  {provider attestation***:1}  Paulene Floor [facility] [phone] (phone) [phone] (fax)  [place]

## 2020-09-12 NOTE — Telephone Encounter (Signed)
Copied from [place] [id number]. Topic: General - Other >> Sep 11, 2020  3:20 PM Pawlus, Brayton Layman A wrote: Reason for CRM: Shelda Pal from home health requested the patients speech therapy evaluation be moved to next week. Please advise.

## 2020-09-12 NOTE — Telephone Encounter (Signed)
They will have to call and reschedule with whomever is coming out there.

## 2020-09-12 NOTE — Telephone Encounter (Signed)
Please review for Jenni.   Thanks,   -Dadrian Ballantine  

## 2020-09-13 ENCOUNTER — Observation Stay (HOSPITAL_COMMUNITY)
Admission: EM | Admit: 2020-09-13 | Discharge: 2020-09-14 | Disposition: A | Discharge status: Home / Self Care | Payer: Medicare Other | Attending: Family Medicine | Admitting: Family Medicine

## 2020-09-13 ENCOUNTER — Emergency Department (HOSPITAL_COMMUNITY): Payer: Medicare Other

## 2020-09-13 ENCOUNTER — Encounter (HOSPITAL_COMMUNITY): Payer: Self-pay

## 2020-09-13 ENCOUNTER — Other Ambulatory Visit: Payer: Self-pay

## 2020-09-13 DIAGNOSIS — Z20822 Contact with and (suspected) exposure to covid-19: Secondary | ICD-10-CM | POA: Insufficient documentation

## 2020-09-13 DIAGNOSIS — Z6841 Body Mass Index (BMI) 40.0 and over, adult: Secondary | ICD-10-CM

## 2020-09-13 DIAGNOSIS — E1122 Type 2 diabetes mellitus with diabetic chronic kidney disease: Secondary | ICD-10-CM | POA: Diagnosis not present

## 2020-09-13 DIAGNOSIS — E8770 Fluid overload, unspecified: Secondary | ICD-10-CM

## 2020-09-13 DIAGNOSIS — N186 End stage renal disease: Secondary | ICD-10-CM | POA: Diagnosis not present

## 2020-09-13 DIAGNOSIS — J9601 Acute respiratory failure with hypoxia: Secondary | ICD-10-CM

## 2020-09-13 DIAGNOSIS — I5042 Chronic combined systolic (congestive) and diastolic (congestive) heart failure: Secondary | ICD-10-CM | POA: Insufficient documentation

## 2020-09-13 DIAGNOSIS — J45909 Unspecified asthma, uncomplicated: Secondary | ICD-10-CM | POA: Insufficient documentation

## 2020-09-13 DIAGNOSIS — Z992 Dependence on renal dialysis: Secondary | ICD-10-CM

## 2020-09-13 DIAGNOSIS — E1100 Type 2 diabetes mellitus with hyperosmolarity without nonketotic hyperglycemic-hyperosmolar coma (NKHHC): Secondary | ICD-10-CM

## 2020-09-13 DIAGNOSIS — J810 Acute pulmonary edema: Secondary | ICD-10-CM | POA: Diagnosis not present

## 2020-09-13 DIAGNOSIS — I132 Hypertensive heart and chronic kidney disease with heart failure and with stage 5 chronic kidney disease, or end stage renal disease: Secondary | ICD-10-CM | POA: Insufficient documentation

## 2020-09-13 DIAGNOSIS — R739 Hyperglycemia, unspecified: Secondary | ICD-10-CM

## 2020-09-13 DIAGNOSIS — Z794 Long term (current) use of insulin: Secondary | ICD-10-CM | POA: Insufficient documentation

## 2020-09-13 DIAGNOSIS — Z79899 Other long term (current) drug therapy: Secondary | ICD-10-CM | POA: Diagnosis not present

## 2020-09-13 DIAGNOSIS — Z87891 Personal history of nicotine dependence: Secondary | ICD-10-CM | POA: Diagnosis not present

## 2020-09-13 DIAGNOSIS — J9691 Respiratory failure, unspecified with hypoxia: Secondary | ICD-10-CM | POA: Diagnosis present

## 2020-09-13 DIAGNOSIS — R2243 Localized swelling, mass and lump, lower limb, bilateral: Secondary | ICD-10-CM | POA: Insufficient documentation

## 2020-09-13 DIAGNOSIS — I2510 Atherosclerotic heart disease of native coronary artery without angina pectoris: Secondary | ICD-10-CM | POA: Insufficient documentation

## 2020-09-13 DIAGNOSIS — Z7982 Long term (current) use of aspirin: Secondary | ICD-10-CM | POA: Insufficient documentation

## 2020-09-13 DIAGNOSIS — R0602 Shortness of breath: Secondary | ICD-10-CM | POA: Diagnosis present

## 2020-09-13 HISTORY — DX: Acute respiratory failure with hypoxia: J96.01

## 2020-09-13 LAB — CBG MONITORING, ED
Glucose-Capillary: 537 mg/dL (ref 70–99)
Glucose-Capillary: 461 mg/dL — ABNORMAL HIGH (ref 70–99)
Glucose-Capillary: 471 mg/dL — ABNORMAL HIGH (ref 70–99)
Glucose-Capillary: 365 mg/dL — ABNORMAL HIGH (ref 70–99)
Glucose-Capillary: 323 mg/dL — ABNORMAL HIGH (ref 70–99)
Glucose-Capillary: 239 mg/dL — ABNORMAL HIGH (ref 70–99)

## 2020-09-13 LAB — RESP PANEL BY RT-PCR (FLU A&B, COVID) ARPGX2
Influenza A by PCR: NEGATIVE
Influenza B by PCR: NEGATIVE
SARS Coronavirus 2 by RT PCR: NEGATIVE

## 2020-09-13 LAB — I-STAT ARTERIAL BLOOD GAS, ED
Acid-Base Excess: 3 mmol/L — ABNORMAL HIGH (ref 0.0–2.0)
Bicarbonate: 28.5 mmol/L — ABNORMAL HIGH (ref 20.0–28.0)
Calcium, Ion: 1.08 mmol/L — ABNORMAL LOW (ref 1.15–1.40)
HCT: 30 % — ABNORMAL LOW (ref 36.0–46.0)
Hemoglobin: 10.2 g/dL — ABNORMAL LOW (ref 12.0–15.0)
O2 Saturation: 100 %
Patient temperature: 98.6
Potassium: 4.1 mmol/L (ref 3.5–5.1)
Sodium: 134 mmol/L — ABNORMAL LOW (ref 135–145)
TCO2: 30 mmol/L (ref 22–32)
pCO2 arterial: 47.1 mmHg (ref 32.0–48.0)
pH, Arterial: 7.39 (ref 7.350–7.450)
pO2, Arterial: 234 mmHg — ABNORMAL HIGH (ref 83.0–108.0)

## 2020-09-13 LAB — BASIC METABOLIC PANEL
Anion gap: 14 (ref 5–15)
Anion gap: 12 (ref 5–15)
Anion gap: 12 (ref 5–15)
BUN: 29 mg/dL — ABNORMAL HIGH (ref 8–23)
BUN: 27 mg/dL — ABNORMAL HIGH (ref 8–23)
BUN: 14 mg/dL (ref 8–23)
CO2: 23 mmol/L (ref 22–32)
CO2: 28 mmol/L (ref 22–32)
CO2: 29 mmol/L (ref 22–32)
Calcium: 8.1 mg/dL — ABNORMAL LOW (ref 8.9–10.3)
Calcium: 8.3 mg/dL — ABNORMAL LOW (ref 8.9–10.3)
Calcium: 9 mg/dL (ref 8.9–10.3)
Chloride: 93 mmol/L — ABNORMAL LOW (ref 98–111)
Chloride: 95 mmol/L — ABNORMAL LOW (ref 98–111)
Chloride: 95 mmol/L — ABNORMAL LOW (ref 98–111)
Creatinine, Ser: 5.65 mg/dL — ABNORMAL HIGH (ref 0.44–1.00)
Creatinine, Ser: 5.72 mg/dL — ABNORMAL HIGH (ref 0.44–1.00)
Creatinine, Ser: 3.65 mg/dL — ABNORMAL HIGH (ref 0.44–1.00)
GFR, Estimated: 8 mL/min — ABNORMAL LOW (ref >60)
GFR, Estimated: 8 mL/min — ABNORMAL LOW (ref >60)
GFR, Estimated: 13 mL/min — ABNORMAL LOW (ref >60)
Glucose, Bld: 604 mg/dL (ref 70–99)
Glucose, Bld: 231 mg/dL — ABNORMAL HIGH (ref 70–99)
Glucose, Bld: 220 mg/dL — ABNORMAL HIGH (ref 70–99)
Potassium: 4.3 mmol/L (ref 3.5–5.1)
Potassium: 4.2 mmol/L (ref 3.5–5.1)
Potassium: 4.1 mmol/L (ref 3.5–5.1)
Sodium: 130 mmol/L — ABNORMAL LOW (ref 135–145)
Sodium: 135 mmol/L (ref 135–145)
Sodium: 136 mmol/L (ref 135–145)

## 2020-09-13 LAB — COMPREHENSIVE METABOLIC PANEL
ALT: 39 U/L (ref 0–44)
AST: 42 U/L — ABNORMAL HIGH (ref 15–41)
Albumin: 2.9 g/dL — ABNORMAL LOW (ref 3.5–5.0)
Alkaline Phosphatase: 359 U/L — ABNORMAL HIGH (ref 38–126)
Anion gap: 13 (ref 5–15)
BUN: 29 mg/dL — ABNORMAL HIGH (ref 8–23)
CO2: 25 mmol/L (ref 22–32)
Calcium: 8 mg/dL — ABNORMAL LOW (ref 8.9–10.3)
Chloride: 92 mmol/L — ABNORMAL LOW (ref 98–111)
Creatinine, Ser: 5.71 mg/dL — ABNORMAL HIGH (ref 0.44–1.00)
GFR, Estimated: 8 mL/min — ABNORMAL LOW (ref >60)
Glucose, Bld: 659 mg/dL (ref 70–99)
Potassium: 4 mmol/L (ref 3.5–5.1)
Sodium: 130 mmol/L — ABNORMAL LOW (ref 135–145)
Total Bilirubin: 0.6 mg/dL (ref 0.3–1.2)
Total Protein: 6.5 g/dL (ref 6.5–8.1)

## 2020-09-13 LAB — CBC WITH DIFFERENTIAL/PLATELET
Abs Immature Granulocytes: 0.05 10*3/uL (ref 0.00–0.07)
Basophils Absolute: 0 10*3/uL (ref 0.0–0.1)
Basophils Relative: 1 %
Eosinophils Absolute: 0.2 10*3/uL (ref 0.0–0.5)
Eosinophils Relative: 4 %
HCT: 29.4 % — ABNORMAL LOW (ref 36.0–46.0)
Hemoglobin: 9.1 g/dL — ABNORMAL LOW (ref 12.0–15.0)
Immature Granulocytes: 1 %
Lymphocytes Relative: 26 %
Lymphs Abs: 1.2 10*3/uL (ref 0.7–4.0)
MCH: 31.5 pg (ref 26.0–34.0)
MCHC: 31 g/dL (ref 30.0–36.0)
MCV: 101.7 fL — ABNORMAL HIGH (ref 80.0–100.0)
Monocytes Absolute: 0.4 10*3/uL (ref 0.1–1.0)
Monocytes Relative: 9 %
Neutro Abs: 2.7 10*3/uL (ref 1.7–7.7)
Neutrophils Relative %: 59 %
Platelets: 136 10*3/uL — ABNORMAL LOW (ref 150–400)
RBC: 2.89 MIL/uL — ABNORMAL LOW (ref 3.87–5.11)
RDW: 15.3 % (ref 11.5–15.5)
WBC: 4.4 10*3/uL (ref 4.0–10.5)
nRBC: 0 % (ref 0.0–0.2)

## 2020-09-13 LAB — BRAIN NATRIURETIC PEPTIDE: B Natriuretic Peptide: 249.6 pg/mL — ABNORMAL HIGH (ref 0.0–100.0)

## 2020-09-13 LAB — TROPONIN I (HIGH SENSITIVITY)
Troponin I (High Sensitivity): 16 ng/L (ref <18)
Troponin I (High Sensitivity): 16 ng/L (ref <18)

## 2020-09-13 LAB — I-STAT CHEM 8, ED
BUN: 29 mg/dL — ABNORMAL HIGH (ref 8–23)
Calcium, Ion: 0.99 mmol/L — ABNORMAL LOW (ref 1.15–1.40)
Chloride: 93 mmol/L — ABNORMAL LOW (ref 98–111)
Creatinine, Ser: 5.5 mg/dL — ABNORMAL HIGH (ref 0.44–1.00)
Glucose, Bld: 679 mg/dL (ref 70–99)
HCT: 31 % — ABNORMAL LOW (ref 36.0–46.0)
Hemoglobin: 10.5 g/dL — ABNORMAL LOW (ref 12.0–15.0)
Potassium: 4.1 mmol/L (ref 3.5–5.1)
Sodium: 134 mmol/L — ABNORMAL LOW (ref 135–145)
TCO2: 28 mmol/L (ref 22–32)

## 2020-09-13 LAB — HEMOGLOBIN A1C
Hgb A1c MFr Bld: 8.4 % — ABNORMAL HIGH (ref 4.8–5.6)
Mean Plasma Glucose: 194.38 mg/dL

## 2020-09-13 LAB — GLUCOSE, CAPILLARY
Glucose-Capillary: 209 mg/dL — ABNORMAL HIGH (ref 70–99)
Glucose-Capillary: 358 mg/dL — ABNORMAL HIGH (ref 70–99)

## 2020-09-13 LAB — OSMOLALITY: Osmolality: 312 mOsm/kg — ABNORMAL HIGH (ref 275–295)

## 2020-09-13 MED ORDER — FENTANYL CITRATE (PF) 100 MCG/2ML IJ SOLN
25.0000 ug | Freq: Once | INTRAMUSCULAR | Status: AC
  Administered 2020-09-13: 25 ug via INTRAVENOUS

## 2020-09-13 MED ORDER — ALTEPLASE 2 MG IJ SOLR: 2.0000 mg | Freq: Once | INTRAMUSCULAR | Status: DC | PRN

## 2020-09-13 MED ORDER — ACETAMINOPHEN 650 MG RE SUPP: 650.0000 mg | Freq: Four times a day (QID) | RECTAL | Status: DC | PRN

## 2020-09-13 MED ORDER — INSULIN GLARGINE 100 UNITS/ML SOLOSTAR PEN: 25.0000 [IU] | PEN_INJECTOR | Freq: Two times a day (BID) | SUBCUTANEOUS | Status: DC

## 2020-09-13 MED ORDER — DOCUSATE SODIUM 100 MG PO CAPS: 100.0000 mg | ORAL_CAPSULE | Freq: Two times a day (BID) | ORAL | Status: DC | PRN

## 2020-09-13 MED ORDER — CARVEDILOL 25 MG PO TABS
25.0000 mg | ORAL_TABLET | Freq: Two times a day (BID) | ORAL | Status: DC
  Administered 2020-09-13: 25 mg via ORAL
  Filled 2020-09-13: qty 1

## 2020-09-13 MED ORDER — ACETAMINOPHEN 325 MG PO TABS: 650.0000 mg | ORAL_TABLET | Freq: Four times a day (QID) | ORAL | Status: DC | PRN

## 2020-09-13 MED ORDER — HEPARIN SODIUM (PORCINE) 5000 UNIT/ML IJ SOLN
5000.0000 [IU] | Freq: Three times a day (TID) | INTRAMUSCULAR | Status: DC
  Administered 2020-09-13 (×2): 5000 [IU] via SUBCUTANEOUS
  Filled 2020-09-13 (×2): qty 1

## 2020-09-13 MED ORDER — SEVELAMER CARBONATE 800 MG PO TABS
800.0000 mg | ORAL_TABLET | Freq: Three times a day (TID) | ORAL | Status: DC
  Administered 2020-09-13: 800 mg via ORAL
  Filled 2020-09-13: qty 1

## 2020-09-13 MED ORDER — CHLORHEXIDINE GLUCONATE CLOTH 2 % EX PADS
6.0000 | MEDICATED_PAD | Freq: Every day | CUTANEOUS | Status: DC
  Administered 2020-09-14: 6 via TOPICAL

## 2020-09-13 MED ORDER — DEXTROSE 50 % IV SOLN: 0.0000 mL | INTRAVENOUS | Status: DC | PRN

## 2020-09-13 MED ORDER — ALBUTEROL SULFATE (2.5 MG/3ML) 0.083% IN NEBU: 2.5000 mg | INHALATION_SOLUTION | Freq: Four times a day (QID) | RESPIRATORY_TRACT | Status: DC | PRN

## 2020-09-13 MED ORDER — FENTANYL CITRATE (PF) 100 MCG/2ML IJ SOLN
INTRAMUSCULAR | Status: AC
  Filled 2020-09-13: qty 2

## 2020-09-13 MED ORDER — BUPROPION HCL ER (SR) 150 MG PO TB12
150.0000 mg | ORAL_TABLET | Freq: Two times a day (BID) | ORAL | Status: DC
  Administered 2020-09-13 – 2020-09-14 (×2): 150 mg via ORAL
  Filled 2020-09-13 (×3): qty 1

## 2020-09-13 MED ORDER — LORATADINE 10 MG PO TABS: 10.0000 mg | ORAL_TABLET | Freq: Every day | ORAL | Status: DC | PRN

## 2020-09-13 MED ORDER — FLUTICASONE PROPIONATE 50 MCG/ACT NA SUSP: 2.0000 | Freq: Every day | NASAL | Status: DC | PRN

## 2020-09-13 MED ORDER — DOXEPIN HCL 10 MG PO CAPS
10.0000 mg | ORAL_CAPSULE | Freq: Two times a day (BID) | ORAL | Status: DC
  Administered 2020-09-13 – 2020-09-14 (×2): 10 mg via ORAL
  Filled 2020-09-13 (×3): qty 1

## 2020-09-13 MED ORDER — OXYCODONE HCL 5 MG PO TABS
5.0000 mg | ORAL_TABLET | Freq: Two times a day (BID) | ORAL | Status: DC | PRN
  Administered 2020-09-13: 5 mg via ORAL
  Filled 2020-09-13: qty 1

## 2020-09-13 MED ORDER — DEXTROSE IN LACTATED RINGERS 5 % IV SOLN: INTRAVENOUS | Status: DC

## 2020-09-13 MED ORDER — VALACYCLOVIR HCL 500 MG PO TABS
500.0000 mg | ORAL_TABLET | ORAL | Status: DC
  Administered 2020-09-13: 500 mg via ORAL
  Filled 2020-09-13: qty 1

## 2020-09-13 MED ORDER — DICYCLOMINE HCL 20 MG PO TABS
20.0000 mg | ORAL_TABLET | Freq: Three times a day (TID) | ORAL | Status: DC
  Administered 2020-09-13 – 2020-09-14 (×2): 20 mg via ORAL
  Filled 2020-09-13 (×5): qty 1

## 2020-09-13 MED ORDER — ALLOPURINOL 100 MG PO TABS
100.0000 mg | ORAL_TABLET | Freq: Every day | ORAL | Status: DC
  Administered 2020-09-13 – 2020-09-14 (×2): 100 mg via ORAL
  Filled 2020-09-13 (×2): qty 1

## 2020-09-13 MED ORDER — INSULIN ASPART 100 UNIT/ML ~~LOC~~ SOLN
0.0000 [IU] | Freq: Every day | SUBCUTANEOUS | Status: DC
  Administered 2020-09-13: 5 [IU] via SUBCUTANEOUS

## 2020-09-13 MED ORDER — FLUTICASONE FUROATE-VILANTEROL 200-25 MCG/INH IN AEPB
1.0000 | INHALATION_SPRAY | Freq: Every day | RESPIRATORY_TRACT | Status: DC
  Administered 2020-09-13 – 2020-09-14 (×2): 1 via RESPIRATORY_TRACT
  Filled 2020-09-13: qty 28

## 2020-09-13 MED ORDER — HEPARIN SODIUM (PORCINE) 1000 UNIT/ML DIALYSIS: 1000.0000 [IU] | INTRAMUSCULAR | Status: DC | PRN

## 2020-09-13 MED ORDER — LIDOCAINE-PRILOCAINE 2.5-2.5 % EX CREA: 1.0000 "application " | TOPICAL_CREAM | CUTANEOUS | Status: DC | PRN

## 2020-09-13 MED ORDER — INSULIN REGULAR(HUMAN) IN NACL 100-0.9 UT/100ML-% IV SOLN
INTRAVENOUS | Status: DC
  Administered 2020-09-13: 11.5 [IU]/h via INTRAVENOUS
  Filled 2020-09-13: qty 100

## 2020-09-13 MED ORDER — SODIUM CHLORIDE 0.9% FLUSH
3.0000 mL | Freq: Two times a day (BID) | INTRAVENOUS | Status: DC
  Administered 2020-09-13 – 2020-09-14 (×2): 3 mL via INTRAVENOUS

## 2020-09-13 MED ORDER — HYDRALAZINE HCL 20 MG/ML IJ SOLN: 10.0000 mg | INTRAMUSCULAR | Status: DC | PRN

## 2020-09-13 MED ORDER — CHLORHEXIDINE GLUCONATE CLOTH 2 % EX PADS: 6.0000 | MEDICATED_PAD | Freq: Every day | CUTANEOUS | Status: DC

## 2020-09-13 MED ORDER — DOXERCALCIFEROL 4 MCG/2ML IV SOLN
4.0000 ug | INTRAVENOUS | Status: DC
  Administered 2020-09-14: 4 ug via INTRAVENOUS

## 2020-09-13 MED ORDER — SODIUM CHLORIDE 0.9 % IV SOLN: 100.0000 mL | INTRAVENOUS | Status: DC | PRN

## 2020-09-13 MED ORDER — TORSEMIDE 20 MG PO TABS
40.0000 mg | ORAL_TABLET | Freq: Two times a day (BID) | ORAL | Status: DC
  Administered 2020-09-13 – 2020-09-14 (×2): 40 mg via ORAL
  Filled 2020-09-13 (×3): qty 2

## 2020-09-13 MED ORDER — LIDOCAINE HCL (PF) 1 % IJ SOLN: 5.0000 mL | INTRAMUSCULAR | Status: DC | PRN

## 2020-09-13 MED ORDER — ASPIRIN EC 81 MG PO TBEC
81.0000 mg | DELAYED_RELEASE_TABLET | Freq: Every day | ORAL | Status: DC
  Administered 2020-09-13 – 2020-09-14 (×2): 81 mg via ORAL
  Filled 2020-09-13 (×2): qty 1

## 2020-09-13 MED ORDER — INSULIN GLARGINE 100 UNIT/ML ~~LOC~~ SOLN
25.0000 [IU] | Freq: Two times a day (BID) | SUBCUTANEOUS | Status: DC
  Administered 2020-09-13 – 2020-09-14 (×2): 25 [IU] via SUBCUTANEOUS
  Filled 2020-09-13 (×3): qty 0.25

## 2020-09-13 MED ORDER — FERRIC CITRATE 1 GM 210 MG(FE) PO TABS
420.0000 mg | ORAL_TABLET | Freq: Three times a day (TID) | ORAL | Status: DC
  Administered 2020-09-13 – 2020-09-14 (×2): 420 mg via ORAL
  Filled 2020-09-13 (×2): qty 2

## 2020-09-13 MED ORDER — MONTELUKAST SODIUM 10 MG PO TABS
10.0000 mg | ORAL_TABLET | Freq: Every evening | ORAL | Status: DC | PRN
  Administered 2020-09-13: 10 mg via ORAL
  Filled 2020-09-13: qty 1

## 2020-09-13 MED ORDER — PENTAFLUOROPROP-TETRAFLUOROETH EX AERO: 1.0000 "application " | INHALATION_SPRAY | CUTANEOUS | Status: DC | PRN

## 2020-09-13 MED ORDER — ATORVASTATIN CALCIUM 80 MG PO TABS
80.0000 mg | ORAL_TABLET | Freq: Every evening | ORAL | Status: DC
  Administered 2020-09-13: 80 mg via ORAL
  Filled 2020-09-13: qty 1

## 2020-09-13 MED ORDER — HYDRALAZINE HCL 50 MG PO TABS
100.0000 mg | ORAL_TABLET | Freq: Three times a day (TID) | ORAL | Status: DC
  Administered 2020-09-13 – 2020-09-14 (×2): 100 mg via ORAL
  Filled 2020-09-13 (×2): qty 2

## 2020-09-13 MED ORDER — ALBUTEROL SULFATE HFA 108 (90 BASE) MCG/ACT IN AERS
2.0000 | INHALATION_SPRAY | RESPIRATORY_TRACT | Status: DC | PRN
  Filled 2020-09-13: qty 6.7

## 2020-09-13 MED ORDER — LACTATED RINGERS IV SOLN: INTRAVENOUS | Status: DC

## 2020-09-13 MED ORDER — INSULIN ASPART 100 UNIT/ML ~~LOC~~ SOLN
0.0000 [IU] | Freq: Three times a day (TID) | SUBCUTANEOUS | Status: DC
  Administered 2020-09-13: 5 [IU] via SUBCUTANEOUS
  Administered 2020-09-14: 11 [IU] via SUBCUTANEOUS

## 2020-09-13 NOTE — ED Notes (Signed)
[facility] - [phone]

## 2020-09-13 NOTE — Consult Note (Signed)
[facility]  INPATIENT CONSULTATION  Reason for Consultation: hypoxia, ESRD Requesting Provider: Dr. Randal Buba  HPI: [name] is an 65 y.o. female with CAD, DM, GERD, HTN, obesity, HCV, depression, HL who is seen for evaluation and management of hypoxic resp failure in setting of ESRD.   Missed last HD reporting "I caught that virus" which sounds like it was nausea and vomiting.  Called EMS this AM for dyspnea, sats 70s initially and required CPAP.  Afebrile, normotensive.  In the ED she is resting comfortably on it.  CXR with mild edema, ABG 7.39 /47 / 234 on it.   K 4.1, BUN 29, Cr 5.5.  COVID pending.    Her last HD was on Tues 3/2 and she was 2.2 under EDW.  Looks like chronically high IDWG.   PMH: Past Medical History:  Diagnosis Date  . Anemia   . Aortic stenosis    Echo 8/18: mean 13, peak 28, LVOT/AV mean velocity 0.51  . Arthritis   . Asthma    As a child   . Bronchitis   . CAD (coronary artery disease)    a. 09/2016: 50% Ost 1st Mrg stenosis, 50% 2nd Mrg stenosis, 20% Mid-Cx, 95% Prox LAD, 40% mid-LAD, and 10% dist-LAD stenosis. Staged PCI with DES to Prox-LAD.   [name] Chronic combined systolic and diastolic CHF (congestive heart failure) ([place]) 2011   echo 2/18: EF 55-60, normal wall motion, grade 2 diastolic dysfunction, trivial AI // echo 3/18: Septal and apical HK, EF 45-50, normal wall motion, trivial AI, mild LAE, PASP 38 // echo 8/18: EF 60-65, normal wall motion, grade 1 diastolic dysfunction, calcified aortic valve leaflets, mild aortic stenosis (mean 13, peak 28, LVOT/AV mean velocity 0.51), mild AI, moderate MAC, mild LAE, trivial TR   . Chronic kidney disease    STAGE 4  . Chronic kidney disease on chronic dialysis (HCC)    t, th, sat  . Complication of anesthesia   . Depression   . Diabetes mellitus Dx 1989  . Elevated lipids   . GERD (gastroesophageal reflux disease)   . Gout   . Heart murmur    asymptomatic  . Hepatitis C Dx 2013  .  Hypertension Dx 1989  . Infected surgical wound    Lt arm  . Myocardial infarction ([place]) 07/2015  . Obesity   . Pancreatitis 2013  . Pneumonia   . Refusal of blood transfusions as patient is Jehovah's Witness   . Tendinitis   . Tremors of nervous system    LEFT HAND  . Ulcer 2010   PSH: Past Surgical History:  Procedure Laterality Date  . A/V FISTULAGRAM Left 04/11/2019   Procedure: A/V FISTULAGRAM;  Surgeon: Katha Cabal, MD;  Location: [place] [facility];  Service: Cardiovascular;  Laterality: Left;  . A/V FISTULAGRAM Left 06/02/2019   Procedure: A/V FISTULAGRAM;  Surgeon: Katha Cabal, MD;  Location: [place] [facility];  Service: Cardiovascular;  Laterality: Left;  . APPLICATION OF WOUND VAC Left 08/14/2017   Procedure: APPLICATION OF WOUND VAC Exchange;  Surgeon: Robert Bellow, MD;  Location: [facility]S;  Service: General;  Laterality: Left;  . APPLICATION OF WOUND VAC Left 12/21/2018   Procedure: APPLICATION OF WOUND VAC;  Surgeon: Katha Cabal, MD;  Location: [facility] ORS;  Service: Vascular;  Laterality: Left;  . AV FISTULA PLACEMENT Left 08/19/2018   Procedure: ARTERIOVENOUS (AV) FISTULA CREATION ( BRACHIOBASILIC );  Surgeon: Katha Cabal, MD;  Location: [facility]  ORS;  Service: Vascular;  Laterality: Left;  . BASCILIC VEIN TRANSPOSITION Left 11/18/2018   Procedure: BASCILIC VEIN TRANSPOSITION;  Surgeon: Katha Cabal, MD;  Location: [facility];  Service: Vascular;  Laterality: Left;  . CHOLECYSTECTOMY    . COLONOSCOPY WITH PROPOFOL N/A 02/03/2018   Procedure: COLONOSCOPY WITH PROPOFOL;  Surgeon: Lin Landsman, MD;  Location: [facility] ENDOSCOPY;  Service: Gastroenterology;  Laterality: N/A;  . CORONARY ANGIOPLASTY  07/2015   STENT  . CORONARY STENT INTERVENTION N/A 09/18/2016   Procedure: Coronary Stent Intervention;  Surgeon: Troy Sine, MD;  Location: [place] [facility];  Service: Cardiovascular;  Laterality: N/A;  . DIALYSIS/PERMA CATHETER INSERTION  N/A 05/10/2018   Procedure: DIALYSIS/PERMA CATHETER INSERTION;  Surgeon: Katha Cabal, MD;  Location: [place] [facility];  Service: Cardiovascular;  Laterality: N/A;  . DRESSING CHANGE UNDER ANESTHESIA Left 08/15/2017   Procedure: exploration of wound for bleeding;  Surgeon: Robert Bellow, MD;  Location: [facility];  Service: General;  Laterality: Left;  . ESOPHAGOGASTRODUODENOSCOPY (EGD) WITH PROPOFOL N/A 02/03/2018   Procedure: ESOPHAGOGASTRODUODENOSCOPY (EGD) WITH PROPOFOL;  Surgeon: Lin Landsman, MD;  Location: [facility] ENDOSCOPY;  Service: Gastroenterology;  Laterality: N/A;  . EYE SURGERY  11/17/2018  . INCISION AND DRAINAGE ABSCESS Left 08/12/2017   Procedure: INCISION AND DRAINAGE ABSCESS;  Surgeon: Robert Bellow, MD;  Location: [facility];  Service: General;  Laterality: Left;  . KNEE ARTHROSCOPY    . LEFT HEART CATH N/A 09/18/2016   Procedure: Left Heart Cath;  Surgeon: Troy Sine, MD;  Location: [place] [facility];  Service: Cardiovascular;  Laterality: N/A;  . LEFT HEART CATH AND CORONARY ANGIOGRAPHY N/A 09/16/2016   Procedure: Left Heart Cath and Coronary Angiography;  Surgeon: Burnell Blanks, MD;  Location: [place] [facility];  Service: Cardiovascular;  Laterality: N/A;  . LEFT HEART CATH AND CORONARY ANGIOGRAPHY N/A 04/29/2017   Procedure: LEFT HEART CATH AND CORONARY ANGIOGRAPHY;  Surgeon: Nelva Bush, MD;  Location: [place] [facility];  Service: Cardiovascular;  Laterality: N/A;  . LOWER EXTREMITY ANGIOGRAPHY Right 03/08/2018   Procedure: LOWER EXTREMITY ANGIOGRAPHY;  Surgeon: Katha Cabal, MD;  Location: [place] [facility];  Service: Cardiovascular;  Laterality: Right;  . TUBAL LIGATION    . TUBAL LIGATION    . UPPER EXTREMITY ANGIOGRAPHY Right 09/19/2019   Procedure: UPPER EXTREMITY ANGIOGRAPHY;  Surgeon: Katha Cabal, MD;  Location: [place] [facility];  Service: Cardiovascular;  Laterality: Right;  . WOUND DEBRIDEMENT Left 12/21/2018    Procedure: DEBRIDEMENT WOUND;  Surgeon: Katha Cabal, MD;  Location: [facility];  Service: Vascular;  Laterality: Left;  . WOUND DEBRIDEMENT Left 12/30/2018   Procedure: DEBRIDEMENT WOUND WITH VAC PLACEMENT (LEFT UPPER EXTREMITY);  Surgeon: Katha Cabal, MD;  Location: [facility];  Service: Vascular;  Laterality: Left;    Past Medical History:  Diagnosis Date  . Anemia   . Aortic stenosis    Echo 8/18: mean 13, peak 28, LVOT/AV mean velocity 0.51  . Arthritis   . Asthma    As a child   . Bronchitis   . CAD (coronary artery disease)    a. 09/2016: 50% Ost 1st Mrg stenosis, 50% 2nd Mrg stenosis, 20% Mid-Cx, 95% Prox LAD, 40% mid-LAD, and 10% dist-LAD stenosis. Staged PCI with DES to Prox-LAD.   [name] Chronic combined systolic and diastolic CHF (congestive heart failure) ([place]) 2011   echo 2/18: EF 55-60, normal wall motion, grade 2 diastolic dysfunction, trivial  AI // echo 3/18: Septal and apical HK, EF 45-50, normal wall motion, trivial AI, mild LAE, PASP 38 // echo 8/18: EF 60-65, normal wall motion, grade 1 diastolic dysfunction, calcified aortic valve leaflets, mild aortic stenosis (mean 13, peak 28, LVOT/AV mean velocity 0.51), mild AI, moderate MAC, mild LAE, trivial TR   . Chronic kidney disease    STAGE 4  . Chronic kidney disease on chronic dialysis (HCC)    t, th, sat  . Complication of anesthesia   . Depression   . Diabetes mellitus Dx 1989  . Elevated lipids   . GERD (gastroesophageal reflux disease)   . Gout   . Heart murmur    asymptomatic  . Hepatitis C Dx 2013  . Hypertension Dx 1989  . Infected surgical wound    Lt arm  . Myocardial infarction ([place]) 07/2015  . Obesity   . Pancreatitis 2013  . Pneumonia   . Refusal of blood transfusions as patient is Jehovah's Witness   . Tendinitis   . Tremors of nervous system    LEFT HAND  . Ulcer 2010    Medications:  I have reviewed the patient's current medications.  (Not in a hospital admission)   ALLERGIES:    Allergies  Allergen Reactions  . Shellfish Allergy Anaphylaxis and Swelling  . Diazepam Other (See Comments)    "felt like out of body experience"    FAM HX: Family History  Problem Relation Age of Onset  . Colon cancer Mother   . Heart attack Other   . Heart attack Maternal Grandmother   . Hypertension Sister   . Hypertension Brother   . Diabetes Paternal Grandmother   . Breast cancer Neg Hx     Social History:   reports that she quit smoking about 39 years ago. Her smoking use included cigarettes. She has a 1.50 pack-year smoking history. She has never used smokeless tobacco. She reports current alcohol use. She reports previous drug use. Drug: Marijuana.  ROS: 12 system ROS per HPI above  Blood pressure (!) 160/95, pulse 77, temperature 98.7 F (37.1 C), temperature source Oral, resp. rate 15, height _0  (1.727 m), weight 129.7 kg, SpO2 100 %. PHYSICAL EXAM: Gen: obese sleeping on cpap and arouses easily  Eyes:  anicteric ENT: dry on cpap Neck: thick, cant assess JVD CV:  RRR Abd:  Soft, obese, nontender Lungs: normal WOB, no rales appreciated (EMS heard initially) GU: no foley Extr: 1+ pitting LE edema to knees; LUE AVF +t/b Neuro: arousable, conversant Skin: no rashes or lesions   Results for orders placed or performed during the hospital encounter of 09/13/20 (from the past 48 hour(s))  CBC with Differential/Platelet     Status: Abnormal   Collection Time: 09/13/20  4:45 AM  Result Value Ref Range   WBC 4.4 4.0 - 10.5 K/uL   RBC 2.89 (L) 3.87 - 5.11 MIL/uL   Hemoglobin 9.1 (L) 12.0 - 15.0 g/dL   HCT 29.4 (L) 36.0 - 46.0 %   MCV 101.7 (H) 80.0 - 100.0 fL   MCH 31.5 26.0 - 34.0 pg   MCHC 31.0 30.0 - 36.0 g/dL   RDW 15.3 11.5 - 15.5 %   Platelets 136 (L) 150 - 400 K/uL   nRBC 0.0 0.0 - 0.2 %   Neutrophils Relative % 59 %   Neutro Abs 2.7 1.7 - 7.7 K/uL   Lymphocytes Relative 26 %   Lymphs Abs 1.2 0.7 - 4.0 K/uL   Monocytes  Relative 9 %   Monocytes  Absolute 0.4 0.1 - 1.0 K/uL   Eosinophils Relative 4 %   Eosinophils Absolute 0.2 0.0 - 0.5 K/uL   Basophils Relative 1 %   Basophils Absolute 0.0 0.0 - 0.1 K/uL   Immature Granulocytes 1 %   Abs Immature Granulocytes 0.05 0.00 - 0.07 K/uL    Comment: Performed at [facility] [address] [place], [place] [address]  I-Stat arterial blood gas, ED     Status: Abnormal   Collection Time: 09/13/20  5:12 AM  Result Value Ref Range   pH, Arterial 7.390 7.350 - 7.450   pCO2 arterial 47.1 32.0 - 48.0 mmHg   pO2, Arterial 234 (H) 83.0 - 108.0 mmHg   Bicarbonate 28.5 (H) 20.0 - 28.0 mmol/L   TCO2 30 22 - 32 mmol/L   O2 Saturation 100.0 %   Acid-Base Excess 3.0 (H) 0.0 - 2.0 mmol/L   Sodium 134 (L) 135 - 145 mmol/L   Potassium 4.1 3.5 - 5.1 mmol/L   Calcium, Ion 1.08 (L) 1.15 - 1.40 mmol/L   HCT 30.0 (L) 36.0 - 46.0 %   Hemoglobin 10.2 (L) 12.0 - 15.0 g/dL   Patient temperature 98.6 F    Collection site Radial    Drawn by RT    Sample type ARTERIAL   I-stat chem 8, ED (not at [facility] or [facility])     Status: Abnormal   Collection Time: 09/13/20  5:37 AM  Result Value Ref Range   Sodium 134 (L) 135 - 145 mmol/L   Potassium 4.1 3.5 - 5.1 mmol/L   Chloride 93 (L) 98 - 111 mmol/L   BUN 29 (H) 8 - 23 mg/dL   Creatinine, Ser 5.50 (H) 0.44 - 1.00 mg/dL   Glucose, Bld 679 (HH) 70 - 99 mg/dL    Comment: Glucose reference range applies only to samples taken after fasting for at least 8 hours.   Calcium, Ion 0.99 (L) 1.15 - 1.40 mmol/L   TCO2 28 22 - 32 mmol/L   Hemoglobin 10.5 (L) 12.0 - 15.0 g/dL   HCT 31.0 (L) 36.0 - 46.0 %   Comment NOTIFIED PHYSICIAN    *Note: Due to a large number of results and/or encounters for the requested time period, some results have not been displayed. A complete set of results can be found in Results Review.    DG Chest Portable 1 View  Result Date: 09/13/2020 CLINICAL DATA:  Shortness of breath EXAM: PORTABLE CHEST 1 VIEW COMPARISON:  01/30/2020 FINDINGS:  Cardiac shadow is prominent but stable. Mild vascular congestion is seen increased from the prior exam. No pulmonary edema is noted. Mild right basilar atelectasis is seen. IMPRESSION: Mild vascular congestion and right basilar atelectasis. No focal confluent infiltrate is noted. Electronically Signed   By: Inez Catalina M.D.   On: 09/13/2020 05:22   Outpt HD orders:  [facility] TTS 4hrs BFR 450 DFR 500 EDW 126.5 2K/2Ca last tx 3/1 post wt 124.3 ran full 4 hrs mircera 75 q2 last 2/22 2/24 Phos 3.8, Ca 8.3, PTH 510 hectorol 4 TIW, sensipar 30 TIW  Assessment/Plan **Acute hypoxic respiratory failure:  Presumably volume overload but in setting of GI illness could have aspirated though CXR now showing that.  HD this AM with max UF.  **ESRD on HD:  HD today off schedule then likely again tomorrow.  Post wt, likely will need new EDW as was under last tx.   COVID pending and  can't do HD until confirm neg.   **Secondary hyperPTH:  Above note last outpt labs.  Cont hectorol with HD here but hold sensipar today given low ionized Ca; use 2.5ca dialysate as well.  Cont binder auryxia.   **Anemia:  Hb 10.5, not due for ESA at this time.   **HTN: Normotensive here.  Cont outpt meds  **DM:  Insulin per primary  Call with concerns.    Justin Mend 09/13/2020, 5:52 AM

## 2020-09-13 NOTE — ED Triage Notes (Signed)
[facility] reports pt is from home. Called out for breathing difficulty. O2 sat was 70's on arrival. Placed on nonrebreather and given albuterol nebulizers x 5 the changed to a C- pap. Audible rales. O2 sat increased to 97% on C-pap. Pt missed dialysis on Thursday. - BP - 130/80, HR 80.

## 2020-09-13 NOTE — Progress Notes (Signed)
RT took pt off bipap and placed on 5L [place] to assess if she was ready to come off bipap. Pt with increased WOB and states she just wants to go home. RT explained that was understandable but if she went home unable to breath there was risk of getting worse or death and we needed to get her breathing better so she could go home safely and not have to return. Pt stated she understood. After about 4 minutes on [place] pt asked to go back on bipap as she was having SOB at this time. Pt was switched from [place] PS to NIV PC to help give her a little more support with CHF and increased WOB. RT will assess frequently to see if she is ready to come off again and closely monitor pt.

## 2020-09-13 NOTE — ED Provider Notes (Signed)
[facility] EMERGENCY DEPARTMENT Provider Note   CSN: [id number] Arrival date & time: 09/13/20  5537     History Chief Complaint  Patient presents with  . Respiratory Distress    [name] is a 65 y.o. female.  The history is provided by the EMS personnel. The history is limited by the condition of the patient.  Shortness of Breath Severity:  Severe Onset quality:  Gradual Timing:  Constant Progression:  Worsening Chronicity:  New Context: not URI   Relieved by:  Nothing Worsened by:  Nothing Ineffective treatments:  None tried Associated symptoms: no fever and no vomiting   Associated symptoms comment:  Leg swelling  Risk factors: no prolonged immobilization   Missed diallysis      Past Medical History:  Diagnosis Date  . Anemia   . Aortic stenosis    Echo 8/18: mean 13, peak 28, LVOT/AV mean velocity 0.51  . Arthritis   . Asthma    As a child   . Bronchitis   . CAD (coronary artery disease)    a. 09/2016: 50% Ost 1st Mrg stenosis, 50% 2nd Mrg stenosis, 20% Mid-Cx, 95% Prox LAD, 40% mid-LAD, and 10% dist-LAD stenosis. Staged PCI with DES to Prox-LAD.   [name] Chronic combined systolic and diastolic CHF (congestive heart failure) ([place]) 2011   echo 2/18: EF 55-60, normal wall motion, grade 2 diastolic dysfunction, trivial AI // echo 3/18: Septal and apical HK, EF 45-50, normal wall motion, trivial AI, mild LAE, PASP 38 // echo 8/18: EF 60-65, normal wall motion, grade 1 diastolic dysfunction, calcified aortic valve leaflets, mild aortic stenosis (mean 13, peak 28, LVOT/AV mean velocity 0.51), mild AI, moderate MAC, mild LAE, trivial TR   . Chronic kidney disease    STAGE 4  . Chronic kidney disease on chronic dialysis (HCC)    t, th, sat  . Complication of anesthesia   . Depression   . Diabetes mellitus Dx 1989  . Elevated lipids   . GERD (gastroesophageal reflux disease)   . Gout   . Heart murmur    asymptomatic  . Hepatitis C Dx 2013  .  Hypertension Dx 1989  . Infected surgical wound    Lt arm  . Myocardial infarction ([place]) 07/2015  . Obesity   . Pancreatitis 2013  . Pneumonia   . Refusal of blood transfusions as patient is Jehovah's Witness   . Tendinitis   . Tremors of nervous system    LEFT HAND  . Ulcer 2010    Patient Active Problem List   Diagnosis Date Noted  . Allergy, unspecified, initial encounter 03/29/2020  . Anaphylactic shock, unspecified, initial encounter 03/29/2020  . Fluid overload 01/30/2020  . Pain in left upper arm 01/18/2020  . Cellulitis 01/16/2020  . Anemia due to end stage renal disease ([place]) 01/16/2020  . Diabetic polyneuropathy associated with type 2 diabetes mellitus ([place]) 11/06/2019  . Hemodialysis status ([place]) 11/06/2019  . Nystagmus 11/06/2019  . Intermittent claudication ([place]) 11/06/2019  . Vertigo of central origin 11/06/2019  . Weakness   . Hypervolemia associated with renal insufficiency 10/31/2019  . BMI 45.0-49.9, adult ([place]) 10/18/2019  . Ascending aortic aneurysm ([place]) 03/27/2019  . Puncture wound without foreign body of left forearm, initial encounter 01/02/2019  . Non-healing wound of upper extremity 12/28/2018  . Unspecified open wound of left upper arm, initial encounter 12/06/2018  . Complication from renal dialysis device 10/20/2018  . Iron deficiency anemia, unspecified 10/18/2018  . ESRD (  end stage renal disease) on dialysis ([place]) 06/01/2018  . ARF (acute renal failure) ([place]) 05/09/2018  . Colon cancer screening   . LGSIL on Pap smear of cervix 01/27/2018  . Gout 12/25/2017  . AKI (acute kidney injury) ([place]) 12/24/2017  . Hyperglycemia 12/24/2017  . Chronic diastolic heart failure ([place]) 09/30/2017  . Lymphedema 09/30/2017  . Aortic stenosis   . Acute pain of both knees 02/12/2017  . Chronic gout due to renal impairment of multiple sites without tophus 02/12/2017  . Esophageal dysphagia 02/12/2017  . Osteoarthritis 01/22/2017  . Diabetic hyperosmolar  non-ketotic state ([place]) 12/13/2016  . CAD (coronary artery disease) 12/13/2016  . Hyperlipidemia 12/13/2016  . Hyperphosphatemia 12/13/2016  . Acute diastolic CHF (congestive heart failure) ([place])   . Asthma 11/29/2015  . Depression 09/05/2015  . GERD (gastroesophageal reflux disease) 03/25/2015  . Environmental allergies 03/14/2015  . SOB (shortness of breath) 02/15/2015  . Chronic hepatitis C without hepatic coma ([place]) 01/31/2015  . Diabetic neuropathy ([place]) 01/31/2015  . OSA (obstructive sleep apnea) 01/01/2015  . Poor dentition 11/13/2014  . Essential hypertension 10/08/2014  . Morbid (severe) obesity due to excess calories ([place]) 10/08/2014  . Localized, primary osteoarthritis 01/25/2014  . Family history of diabetes mellitus 03/31/2013  . Pain in limb 03/31/2013  . Type 2 diabetes mellitus with ESRD (end-stage renal disease) ([place]) 03/25/2012    Class: Chronic    Past Surgical History:  Procedure Laterality Date  . A/V FISTULAGRAM Left 04/11/2019   Procedure: A/V FISTULAGRAM;  Surgeon: Katha Cabal, MD;  Location: [facility];  Service: Cardiovascular;  Laterality: Left;  . A/V FISTULAGRAM Left 06/02/2019   Procedure: A/V FISTULAGRAM;  Surgeon: Katha Cabal, MD;  Location: [facility];  Service: Cardiovascular;  Laterality: Left;  . APPLICATION OF WOUND VAC Left 08/14/2017   Procedure: APPLICATION OF WOUND VAC Exchange;  Surgeon: Robert Bellow, MD;  Location: [facility];  Service: General;  Laterality: Left;  . APPLICATION OF WOUND VAC Left 12/21/2018   Procedure: APPLICATION OF WOUND VAC;  Surgeon: Katha Cabal, MD;  Location: [facility];  Service: Vascular;  Laterality: Left;  . AV FISTULA PLACEMENT Left 08/19/2018   Procedure: ARTERIOVENOUS (AV) FISTULA CREATION ( BRACHIOBASILIC );  Surgeon: Katha Cabal, MD;  Location: [facility];  Service: Vascular;  Laterality: Left;  . BASCILIC VEIN TRANSPOSITION Left 11/18/2018   Procedure: BASCILIC VEIN  TRANSPOSITION;  Surgeon: Katha Cabal, MD;  Location: [facility];  Service: Vascular;  Laterality: Left;  . CHOLECYSTECTOMY    . COLONOSCOPY WITH PROPOFOL N/A 02/03/2018   Procedure: COLONOSCOPY WITH PROPOFOL;  Surgeon: Lin Landsman, MD;  Location: [facility] ENDOSCOPY;  Service: Gastroenterology;  Laterality: N/A;  . CORONARY ANGIOPLASTY  07/2015   STENT  . CORONARY STENT INTERVENTION N/A 09/18/2016   Procedure: Coronary Stent Intervention;  Surgeon: Troy Sine, MD;  Location: [facility];  Service: Cardiovascular;  Laterality: N/A;  . DIALYSIS/PERMA CATHETER INSERTION N/A 05/10/2018   Procedure: DIALYSIS/PERMA CATHETER INSERTION;  Surgeon: Katha Cabal, MD;  Location: [facility];  Service: Cardiovascular;  Laterality: N/A;  . DRESSING CHANGE UNDER ANESTHESIA Left 08/15/2017   Procedure: exploration of wound for bleeding;  Surgeon: Robert Bellow, MD;  Location: [facility];  Service: General;  Laterality: Left;  . ESOPHAGOGASTRODUODENOSCOPY (EGD) WITH PROPOFOL N/A 02/03/2018   Procedure: ESOPHAGOGASTRODUODENOSCOPY (EGD) WITH PROPOFOL;  Surgeon: Lin Landsman, MD;  Location: [facility] ENDOSCOPY;  Service: Gastroenterology;  Laterality: N/A;  .  EYE SURGERY  11/17/2018  . INCISION AND DRAINAGE ABSCESS Left 08/12/2017   Procedure: INCISION AND DRAINAGE ABSCESS;  Surgeon: Robert Bellow, MD;  Location: [facility];  Service: General;  Laterality: Left;  . KNEE ARTHROSCOPY    . LEFT HEART CATH N/A 09/18/2016   Procedure: Left Heart Cath;  Surgeon: Troy Sine, MD;  Location: [facility];  Service: Cardiovascular;  Laterality: N/A;  . LEFT HEART CATH AND CORONARY ANGIOGRAPHY N/A 09/16/2016   Procedure: Left Heart Cath and Coronary Angiography;  Surgeon: Burnell Blanks, MD;  Location: [facility];  Service: Cardiovascular;  Laterality: N/A;  . LEFT HEART CATH AND CORONARY ANGIOGRAPHY N/A 04/29/2017   Procedure: LEFT HEART CATH AND CORONARY  ANGIOGRAPHY;  Surgeon: Nelva Bush, MD;  Location: [facility];  Service: Cardiovascular;  Laterality: N/A;  . LOWER EXTREMITY ANGIOGRAPHY Right 03/08/2018   Procedure: LOWER EXTREMITY ANGIOGRAPHY;  Surgeon: Katha Cabal, MD;  Location: [facility];  Service: Cardiovascular;  Laterality: Right;  . TUBAL LIGATION    . TUBAL LIGATION    . UPPER EXTREMITY ANGIOGRAPHY Right 09/19/2019   Procedure: UPPER EXTREMITY ANGIOGRAPHY;  Surgeon: Katha Cabal, MD;  Location: [facility];  Service: Cardiovascular;  Laterality: Right;  . WOUND DEBRIDEMENT Left 12/21/2018   Procedure: DEBRIDEMENT WOUND;  Surgeon: Katha Cabal, MD;  Location: [facility];  Service: Vascular;  Laterality: Left;  . WOUND DEBRIDEMENT Left 12/30/2018   Procedure: DEBRIDEMENT WOUND WITH VAC PLACEMENT (LEFT UPPER EXTREMITY);  Surgeon: Katha Cabal, MD;  Location: [facility];  Service: Vascular;  Laterality: Left;     OB History    Gravida  4   Para  3   Term  3   Preterm      AB  1   Living  3     SAB  1   IAB      Ectopic      Multiple      Live Births  3           Family History  Problem Relation Age of Onset  . Colon cancer Mother   . Heart attack Other   . Heart attack Maternal Grandmother   . Hypertension Sister   . Hypertension Brother   . Diabetes Paternal Grandmother   . Breast cancer Neg Hx     Social History   Tobacco Use  . Smoking status: Former Smoker    Packs/day: 0.25    Years: 6.00    Pack years: 1.50    Types: Cigarettes    Quit date: 10/25/1980    Years since quitting: 39.9  . Smokeless tobacco: Never Used  Vaping Use  . Vaping Use: Never used  Substance Use Topics  . Alcohol use: Yes    Comment: rare  . Drug use: Not Currently    Types: Marijuana    Home Medications Prior to Admission medications   Medication Sig Start Date End Date Taking? Authorizing Provider  albuterol (VENTOLIN HFA) 108 (90 Base) MCG/ACT inhaler  Inhale 2 puffs into the lungs every 4 (four) hours as needed for wheezing or shortness of breath. 06/28/20   [name], PA-C  allopurinol (ZYLOPRIM) 100 MG tablet TAKE 1 TABLET BY MOUTH ONCE DAILY. 12/08/19   [name], PA-C  aspirin EC 81 MG EC tablet Take 1 tablet (81 mg total) by mouth daily. 09/19/16   Strader, Fransisco Hertz, PA-C  atorvastatin (LIPITOR) 80  MG tablet TAKE 1 TABLET BY MOUTH ONCE DAILY AT 6PM 07/08/20   [name], PA-C  AURYXIA 1 GM 210 MG(Fe) tablet Take 2 tablets (420 mg total) by mouth 3 (three) times daily with meals. 05/03/20   [name], PA-C  b complex-vitamin c-folic acid (NEPHRO-VITE) 0.8 MG TABS tablet Take 1 tablet by mouth daily.     [provider]  buPROPion (WELLBUTRIN SR) 150 MG 12 hr tablet Take 1 tablet (150 mg total) by mouth 2 (two) times daily. 05/03/20   [name], PA-C  carvedilol (COREG) 25 MG tablet TAKE (1) TABLET BY MOUTH TWICE A DAY WITH MEALS (BREAKFAST AND SUPPER) 07/15/20   [name], PA-C  Cinacalcet HCl (SENSIPAR PO) Take 30 mg by mouth every dialysis. 01/04/20 01/02/21  [provider]  Continuous Blood Gluc Receiver (FREESTYLE LIBRE 14 DAY READER) DEVI To check blood sugar ACHS 05/24/20   [name], PA-C  Continuous Blood Gluc Sensor (FREESTYLE LIBRE 14 DAY SENSOR) MISC To check blood sugar ACHS; change every 14 days 05/24/20   [name], PA-C  cyclobenzaprine (FLEXERIL) 5 MG tablet Take 1 tablet (5 mg total) by mouth 3 (three) times daily as needed for muscle spasms. 08/10/19   [name], PA-C  dicyclomine (BENTYL) 20 MG tablet TAKE 1 TABLET(20 MG) BY MOUTH FOUR TIMES DAILY BEFORE MEALS AND AT BEDTIME Patient taking differently: Take 20 mg by mouth 4 (four) times daily -  before meals and at bedtime. 10/16/19   [name], PA-C  docusate sodium (COLACE) 100 MG capsule Take 1 capsule (100 mg total) by mouth 2 (two) times daily as needed  for mild constipation. 09/20/17   Gouru, Illene Silver, MD  doxepin (SINEQUAN) 10 MG capsule TAKE 1 CAPSULE(10 MG) BY MOUTH FOUR TIMES DAILY AS NEEDED FOR ITCHING 05/03/20   Burnette, Clearnce Sorrel, PA-C  DULERA 200-5 MCG/ACT AERO Inhale 2 puffs into the lungs 2 (two) times daily as needed.  11/26/19   [provider]  fluticasone (FLONASE) 50 MCG/ACT nasal spray Place 2 sprays into both nostrils daily as needed for allergies or rhinitis. 09/28/17   [name], PA-C  fluticasone furoate-vilanterol (BREO ELLIPTA) 200-25 MCG/INH AEPB Inhale 1 puff into the lungs daily. 08/30/20   [name], PA-C  gentamicin cream (GARAMYCIN) 0.1 % Apply 1 application topically 2 (two) times daily. Patient taking differently: Apply 1 application topically 2 (two) times daily as needed. 11/20/19   Edrick Kins, DPM  hydrALAZINE (APRESOLINE) 100 MG tablet TAKE (1) TABLET BY MOUTH THREE TIMES DAILY 07/15/20   [name], PA-C  hydrOXYzine (ATARAX/VISTARIL) 25 MG tablet Take 1 tablet (25 mg total) by mouth 4 (four) times daily. 05/03/20   [name], PA-C  insulin detemir (LEVEMIR FLEXTOUCH) 100 UNIT/ML FlexPen Inject 60 Units into the skin 2 (two) times daily. 05/24/20   [name], PA-C  insulin lispro (HUMALOG KWIKPEN) 100 UNIT/ML KwikPen Inject 25 Units into the skin with breakfast, with lunch, and with evening meal. 05/14/20   Burnette, Clearnce Sorrel, PA-C  lidocaine-prilocaine (EMLA) cream Apply 1 application topically as needed. 03/27/19   Schnier, Dolores Lory, MD  LOPERAMIDE HCL PO Take 1 tablet by mouth as needed.  10/21/19 10/19/20  [provider]  loratadine (CLARITIN) 10 MG tablet TAKE 1 TABLET BY MOUTH ONCE DAILY. 07/15/20   [name], PA-C  mometasone (ELOCON) 0.1 % cream APPLY AS DIRECTED TO AFFECTED AREA ONCE DAILY.  Patient taking differently: Apply 1 application topically daily as needed. 12/08/18   [name], PA-C  montelukast (SINGULAIR) 10 MG  tablet TAKE ONE TABLET BY MOUTH AT BEDTIME. 07/15/20   Burnette, Clearnce Sorrel, PA-C  nitroGLYCERIN (NITROSTAT) 0.4 MG SL tablet Place 1 tablet (0.4 mg total) under the tongue every 5 (five) minutes x 3 doses as needed for chest pain. 05/03/20   [name], PA-C  Nutritional Supplements (FEEDING SUPPLEMENT, NEPRO CARB STEADY,) LIQD Take 237 mLs by mouth daily. 02/01/20   Loletha Grayer, MD  nystatin (MYCOSTATIN) 100000 UNIT/ML suspension Take 5 mLs (500,000 Units total) by mouth 4 (four) times daily. 06/28/20   [name], PA-C  nystatin cream (MYCOSTATIN) Apply 1 application topically 2 (two) times daily. Patient taking differently: Apply 1 application topically daily as needed for dry skin. 08/10/19   [name], PA-C  omeprazole (PRILOSEC) 20 MG capsule TAKE (1) CAPSULE BY MOUTH ONCE DAILY. 07/15/20   [name], PA-C  oxyCODONE (ROXICODONE) 5 MG immediate release tablet Take 1 tablet (5 mg total) by mouth every 12 (twelve) hours as needed for severe pain. 08/30/20   [name], PA-C  pregabalin (LYRICA) 150 MG capsule TAKE 1 CAPSULE(150 MG) BY MOUTH TWICE DAILY 02/29/20   [name], PA-C  SENNA-PLUS 8.6-50 MG tablet TAKE ONE TABLET BY MOUTH AT BEDTIME. 07/15/20   [name], PA-C  silver sulfADIAZINE (SILVADENE) 1 % cream Apply 1 application topically daily. Apply to right first toe Patient taking differently: Apply 1 application topically daily as needed (pain). 02/21/19   Kris Hartmann, NP  torsemide (DEMADEX) 20 MG tablet TAKE (2) TABLETS BY MOUTH TWICE DAILY. 05/21/20   [name], PA-C  triamcinolone cream (KENALOG) 0.1 % APPLY TO AFFECTED AREA TWICE DAILY Patient taking differently: 2 (two) times daily as needed. 06/17/18   [name], PA-C  valACYclovir (VALTREX) 500 MG tablet TAKE (1) TABLET BY MOUTH EVERY OTHER DAY. 09/06/20   [name], PA-C  VICTOZA 18 MG/3ML SOPN Start with 0.4m daily and  increase by 0.639mper week until max dose of 1.8 mg dose achieved. 03/25/20   BuMar DaringPA-C  Vitamin D, Ergocalciferol, (DRISDOL) 1.25 MG (50000 UNIT) CAPS capsule TAKE 1 CAPSULE BY MOUTH ONCE A MONTH 07/17/20   BuMar DaringPA-C  gabapentin (NEURONTIN) 100 MG capsule Take 1 capsule (100 mg total) by mouth 3 (three) times daily. 08/19/17 02/03/19  PaFritzi MandesMD    Allergies    Shellfish allergy and Diazepam  Review of Systems   Review of Systems  Unable to perform ROS: Acuity of condition  Constitutional: Negative for fever.  Respiratory: Positive for shortness of breath.   Cardiovascular: Positive for leg swelling.  Gastrointestinal: Negative for vomiting.    Physical Exam Updated Vital Signs BP (!) 167/83   Pulse 74   Temp 98.7 F (37.1 C) (Oral)   Resp (!) 23   Ht _0  (1.727 m)   Wt 129.7 kg   SpO2 100%   BMI 43.49 kg/m   Physical Exam Vitals and nursing note reviewed.  Constitutional:      Appearance: She is not diaphoretic.  HENT:     Head: Normocephalic and atraumatic.     Nose: Nose normal.  Eyes:     Conjunctiva/sclera: Conjunctivae normal.     Pupils: Pupils are equal, round, and reactive to light.  Cardiovascular:     Rate and Rhythm: Normal rate  and regular rhythm.     Pulses: Normal pulses.     Heart sounds: Normal heart sounds.  Pulmonary:     Effort: Tachypnea present.     Breath sounds: Rales present.  Abdominal:     General: Bowel sounds are normal.     Palpations: Abdomen is soft.     Tenderness: There is no abdominal tenderness. There is no guarding.  Musculoskeletal:     Cervical back: Normal range of motion and neck supple. No rigidity.     Right lower leg: Edema present.     Left lower leg: Edema present.  Skin:    General: Skin is warm and dry.     Capillary Refill: Capillary refill takes less than 2 seconds.  Neurological:     General: No focal deficit present.     Mental Status: She is alert.     Deep Tendon  Reflexes: Reflexes normal.  Psychiatric:        Mood and Affect: Mood normal.     ED Results / Procedures / Treatments   Labs (all labs ordered are listed, but only abnormal results are displayed) Results for orders placed or performed during the hospital encounter of 09/13/20  Resp Panel by RT-PCR (Flu A&B, Covid) Nasopharyngeal Swab   Specimen: Nasopharyngeal Swab; Nasopharyngeal(NP) swabs in vial transport medium  Result Value Ref Range   SARS Coronavirus 2 by RT PCR NEGATIVE NEGATIVE   Influenza A by PCR NEGATIVE NEGATIVE   Influenza B by PCR NEGATIVE NEGATIVE  CBC with Differential/Platelet  Result Value Ref Range   WBC 4.4 4.0 - 10.5 K/uL   RBC 2.89 (L) 3.87 - 5.11 MIL/uL   Hemoglobin 9.1 (L) 12.0 - 15.0 g/dL   HCT 29.4 (L) 36.0 - 46.0 %   MCV 101.7 (H) 80.0 - 100.0 fL   MCH 31.5 26.0 - 34.0 pg   MCHC 31.0 30.0 - 36.0 g/dL   RDW 15.3 11.5 - 15.5 %   Platelets 136 (L) 150 - 400 K/uL   nRBC 0.0 0.0 - 0.2 %   Neutrophils Relative % 59 %   Neutro Abs 2.7 1.7 - 7.7 K/uL   Lymphocytes Relative 26 %   Lymphs Abs 1.2 0.7 - 4.0 K/uL   Monocytes Relative 9 %   Monocytes Absolute 0.4 0.1 - 1.0 K/uL   Eosinophils Relative 4 %   Eosinophils Absolute 0.2 0.0 - 0.5 K/uL   Basophils Relative 1 %   Basophils Absolute 0.0 0.0 - 0.1 K/uL   Immature Granulocytes 1 %   Abs Immature Granulocytes 0.05 0.00 - 0.07 K/uL  Hemoglobin A1c  Result Value Ref Range   Hgb A1c MFr Bld 8.4 (H) 4.8 - 5.6 %   Mean Plasma Glucose 194.38 mg/dL  I-stat chem 8, ED (not at [facility] or [facility])  Result Value Ref Range   Sodium 134 (L) 135 - 145 mmol/L   Potassium 4.1 3.5 - 5.1 mmol/L   Chloride 93 (L) 98 - 111 mmol/L   BUN 29 (H) 8 - 23 mg/dL   Creatinine, Ser 5.50 (H) 0.44 - 1.00 mg/dL   Glucose, Bld 679 (HH) 70 - 99 mg/dL   Calcium, Ion 0.99 (L) 1.15 - 1.40 mmol/L   TCO2 28 22 - 32 mmol/L   Hemoglobin 10.5 (L) 12.0 - 15.0 g/dL   HCT 31.0 (L) 36.0 - 46.0 %   Comment NOTIFIED PHYSICIAN   I-Stat arterial  blood gas, ED  Result Value Ref Range   pH,  Arterial 7.390 7.350 - 7.450   pCO2 arterial 47.1 32.0 - 48.0 mmHg   pO2, Arterial 234 (H) 83.0 - 108.0 mmHg   Bicarbonate 28.5 (H) 20.0 - 28.0 mmol/L   TCO2 30 22 - 32 mmol/L   O2 Saturation 100.0 %   Acid-Base Excess 3.0 (H) 0.0 - 2.0 mmol/L   Sodium 134 (L) 135 - 145 mmol/L   Potassium 4.1 3.5 - 5.1 mmol/L   Calcium, Ion 1.08 (L) 1.15 - 1.40 mmol/L   HCT 30.0 (L) 36.0 - 46.0 %   Hemoglobin 10.2 (L) 12.0 - 15.0 g/dL   Patient temperature 98.6 F    Collection site Radial    Drawn by RT    Sample type ARTERIAL    *Note: Due to a large number of results and/or encounters for the requested time period, some results have not been displayed. A complete set of results can be found in Results Review.   DG Chest Portable 1 View  Result Date: 09/13/2020 CLINICAL DATA:  Shortness of breath EXAM: PORTABLE CHEST 1 VIEW COMPARISON:  01/30/2020 FINDINGS: Cardiac shadow is prominent but stable. Mild vascular congestion is seen increased from the prior exam. No pulmonary edema is noted. Mild right basilar atelectasis is seen. IMPRESSION: Mild vascular congestion and right basilar atelectasis. No focal confluent infiltrate is noted. Electronically Signed   By: Inez Catalina M.D.   On: 09/13/2020 05:22    EKG EKG Interpretation  Date/Time:  Friday September 13 2020 04:34:32 EST Ventricular Rate:  77 PR Interval:    QRS Duration: 97 QT Interval:  464 QTC Calculation: 526 R Axis:   35 Text Interpretation: Sinus rhythm Prolonged QT interval Confirmed by Dory Horn) on 09/13/2020 5:06:27 AM   Radiology DG Chest Portable 1 View  Result Date: 09/13/2020 CLINICAL DATA:  Shortness of breath EXAM: PORTABLE CHEST 1 VIEW COMPARISON:  01/30/2020 FINDINGS: Cardiac shadow is prominent but stable. Mild vascular congestion is seen increased from the prior exam. No pulmonary edema is noted. Mild right basilar atelectasis is seen. IMPRESSION: Mild vascular  congestion and right basilar atelectasis. No focal confluent infiltrate is noted. Electronically Signed   By: Inez Catalina M.D.   On: 09/13/2020 05:22    Procedures Procedures   Medications Ordered in ED Medications  albuterol (VENTOLIN HFA) 108 (90 Base) MCG/ACT inhaler 2 puff (has no administration in time range)  insulin regular, human (MYXREDLIN) 100 units/ 100 mL infusion (has no administration in time range)  lactated ringers infusion (has no administration in time range)  dextrose 5 % in lactated ringers infusion (has no administration in time range)  dextrose 50 % solution 0-50 mL (has no administration in time range)  Chlorhexidine Gluconate Cloth 2 % PADS 6 each (has no administration in time range)  doxercalciferol (HECTOROL) injection 4 mcg (has no administration in time range)    ED Course  I have reviewed the triage vital signs and the nursing notes.  Pertinent labs & imaging results that were available during my care of the patient were reviewed by me and considered in my medical decision making (see chart for details).    MDM Reviewed: previous chart, nursing note and vitals Reviewed previous: labs Interpretation: labs, ECG and x-Rann (hyperglycemia, renal failure. acute pulmonary edema by me on CXR) Total time providing critical care: 30-74 minutes (BIpap and insulin drip ). This excludes time spent performing separately reportable procedures and services. Consults: admitting MD (Case d/w Dr. Johnney Ou of nephrology, they will consult for  dialysis)  CRITICAL CARE Performed by: Tamberly Pomplun K Tela Kotecki-Rasch Total critical care time: 60 minutes Critical care time was exclusive of separately billable procedures and treating other patients. Critical care was necessary to treat or prevent imminent or life-threatening deterioration. Critical care was time spent personally by me on the following activities: development of treatment plan with patient and/or surrogate as well as  nursing, discussions with consultants, evaluation of patient's response to treatment, examination of patient, obtaining history from patient or surrogate, ordering and performing treatments and interventions, ordering and review of laboratory studies, ordering and review of radiographic studies, pulse oximetry and re-evaluation of patient's condition.  Final Clinical Impression(s) / ED Diagnoses Final diagnoses:  Acute pulmonary edema ([place])    Admit to medicine    Cleopatra Sardo, MD 09/13/20 [id number]

## 2020-09-13 NOTE — H&P (Signed)
History and Physical    [name]:[id number] DOB: Dec 26, 1955 DOA: 09/13/2020  Referring MD/NP/PA: Tennis Must, MD PCP: [name], PA-C  Patient coming from: Home Via EMS  Chief Complaint: Couldn't breath  I have personally briefly reviewed patient's old medical records in [place]   HPI: [name] is a 65 y.o. female with medical history significant of ESRD on HD(TTS), CAD, uncontrolled DM type II, dysphagia, morbid obesity, HCV, and depression presents with complaints of being short of breath that started yesterday evening.  Patient complained of being unable to lay flat due to worsening of breathing.  Reported associated symptoms of a productive cough and wheezing.  Denies having any chest pain with yesterday's event, but chronically reports having chest pain symptoms. She had missed her hemodialysis session yesterday due to complaints of her being sick with a stomach bug.  Complained of having nausea, vomiting, and severe pain in her groin.  Nausea and vomiting symptoms have since resolved.  Patient had been hospitalized 07/11/2020 at [facility] found to have a necrotizing fasciitis groin and multiple treatments with ostomy placement.  She reports that the wound has been healing well.    EMS found patient have O2 saturations in the 70s on arrival and she was placed on a nonrebreather mask.  Given albuterol neb x5 and then changed to CPAP.  O2 saturations increased to 97%.   ED Course: Upon admission into the emergency department patient was seen to be afebrile with respirations 15-23, blood pressure 148/88 -167/83, and O2 saturation currently maintained on BiPAP.  Labs were significant for WBC 4.4, hemoglobin 9.1 with repeat check 10.5, sodium 130, chloride 92, CO2 25, BUN 29, creatinine 5.71, calcium 8, alkaline phosphatase 359, AST 42, BNP 249.6, osmolarity 312, and troponin 16.  COVID-19 screening was negative.  Significant for mild vascular congestion and  right basilar atelectasis with no confluent infiltrate appreciated.  Nephrology was formally consulted and set the patient up to have hemodialysis.  Due to elevated blood sugars she was started on insulin drip.  Review of Systems  Constitutional: Positive for malaise/fatigue.  Eyes: Negative for pain and discharge.  Respiratory: Positive for cough and shortness of breath.   Cardiovascular: Positive for leg swelling. Negative for chest pain.  Gastrointestinal: Negative for abdominal pain, nausea and vomiting.  Musculoskeletal: Positive for myalgias.  Neurological: Negative for focal weakness and loss of consciousness.    Past Medical History:  Diagnosis Date  . Anemia   . Aortic stenosis    Echo 8/18: mean 13, peak 28, LVOT/AV mean velocity 0.51  . Arthritis   . Asthma    As a child   . Bronchitis   . CAD (coronary artery disease)    a. 09/2016: 50% Ost 1st Mrg stenosis, 50% 2nd Mrg stenosis, 20% Mid-Cx, 95% Prox LAD, 40% mid-LAD, and 10% dist-LAD stenosis. Staged PCI with DES to Prox-LAD.   [name] Chronic combined systolic and diastolic CHF (congestive heart failure) ([place]) 2011   echo 2/18: EF 55-60, normal wall motion, grade 2 diastolic dysfunction, trivial AI // echo 3/18: Septal and apical HK, EF 45-50, normal wall motion, trivial AI, mild LAE, PASP 38 // echo 8/18: EF 60-65, normal wall motion, grade 1 diastolic dysfunction, calcified aortic valve leaflets, mild aortic stenosis (mean 13, peak 28, LVOT/AV mean velocity 0.51), mild AI, moderate MAC, mild LAE, trivial TR   . Chronic kidney disease    STAGE 4  . Chronic kidney disease on chronic dialysis (  [place])    t, th, sat  . Complication of anesthesia   . Depression   . Diabetes mellitus Dx 1989  . Elevated lipids   . GERD (gastroesophageal reflux disease)   . Gout   . Heart murmur    asymptomatic  . Hepatitis C Dx 2013  . Hypertension Dx 1989  . Infected surgical wound    Lt arm  . Myocardial infarction ([place]) 07/2015  .  Obesity   . Pancreatitis 2013  . Pneumonia   . Refusal of blood transfusions as patient is Jehovah's Witness   . Tendinitis   . Tremors of nervous system    LEFT HAND  . Ulcer 2010    Past Surgical History:  Procedure Laterality Date  . A/V FISTULAGRAM Left 04/11/2019   Procedure: A/V FISTULAGRAM;  Surgeon: Katha Cabal, MD;  Location: [facility];  Service: Cardiovascular;  Laterality: Left;  . A/V FISTULAGRAM Left 06/02/2019   Procedure: A/V FISTULAGRAM;  Surgeon: Katha Cabal, MD;  Location: [facility];  Service: Cardiovascular;  Laterality: Left;  . APPLICATION OF WOUND VAC Left 08/14/2017   Procedure: APPLICATION OF WOUND VAC Exchange;  Surgeon: Robert Bellow, MD;  Location: [facility];  Service: General;  Laterality: Left;  . APPLICATION OF WOUND VAC Left 12/21/2018   Procedure: APPLICATION OF WOUND VAC;  Surgeon: Katha Cabal, MD;  Location: [facility];  Service: Vascular;  Laterality: Left;  . AV FISTULA PLACEMENT Left 08/19/2018   Procedure: ARTERIOVENOUS (AV) FISTULA CREATION ( BRACHIOBASILIC );  Surgeon: Katha Cabal, MD;  Location: [facility];  Service: Vascular;  Laterality: Left;  . BASCILIC VEIN TRANSPOSITION Left 11/18/2018   Procedure: BASCILIC VEIN TRANSPOSITION;  Surgeon: Katha Cabal, MD;  Location: [facility];  Service: Vascular;  Laterality: Left;  . CHOLECYSTECTOMY    . COLONOSCOPY WITH PROPOFOL N/A 02/03/2018   Procedure: COLONOSCOPY WITH PROPOFOL;  Surgeon: Lin Landsman, MD;  Location: [facility];  Service: Gastroenterology;  Laterality: N/A;  . CORONARY ANGIOPLASTY  07/2015   STENT  . CORONARY STENT INTERVENTION N/A 09/18/2016   Procedure: Coronary Stent Intervention;  Surgeon: Troy Sine, MD;  Location: [place];  Service: Cardiovascular;  Laterality: N/A;  . DIALYSIS/PERMA CATHETER INSERTION N/A 05/10/2018   Procedure: DIALYSIS/PERMA CATHETER INSERTION;  Surgeon: Katha Cabal, MD;  Location:  [place] [facility];  Service: Cardiovascular;  Laterality: N/A;  . DRESSING CHANGE UNDER ANESTHESIA Left 08/15/2017   Procedure: exploration of wound for bleeding;  Surgeon: Robert Bellow, MD;  Location: [facility];  Service: General;  Laterality: Left;  . ESOPHAGOGASTRODUODENOSCOPY (EGD) WITH PROPOFOL N/A 02/03/2018   Procedure: ESOPHAGOGASTRODUODENOSCOPY (EGD) WITH PROPOFOL;  Surgeon: Lin Landsman, MD;  Location: [facility]OSCOPY;  Service: Gastroenterology;  Laterality: N/A;  . EYE SURGERY  11/17/2018  . INCISION AND DRAINAGE ABSCESS Left 08/12/2017   Procedure: INCISION AND DRAINAGE ABSCESS;  Surgeon: Robert Bellow, MD;  Location: [facility];  Service: General;  Laterality: Left;  . KNEE ARTHROSCOPY    . LEFT HEART CATH N/A 09/18/2016   Procedure: Left Heart Cath;  Surgeon: Troy Sine, MD;  Location: [facility] [facility];  Service: Cardiovascular;  Laterality: N/A;  . LEFT HEART CATH AND CORONARY ANGIOGRAPHY N/A 09/16/2016   Procedure: Left Heart Cath and Coronary Angiography;  Surgeon: Burnell Blanks, MD;  Location: [place] [facility];  Service: Cardiovascular;  Laterality: N/A;  . LEFT HEART CATH AND CORONARY ANGIOGRAPHY N/A  04/29/2017   Procedure: LEFT HEART CATH AND CORONARY ANGIOGRAPHY;  Surgeon: Nelva Bush, MD;  Location: [place] [facility];  Service: Cardiovascular;  Laterality: N/A;  . LOWER EXTREMITY ANGIOGRAPHY Right 03/08/2018   Procedure: LOWER EXTREMITY ANGIOGRAPHY;  Surgeon: Katha Cabal, MD;  Location: [place];  Service: Cardiovascular;  Laterality: Right;  . TUBAL LIGATION    . TUBAL LIGATION    . UPPER EXTREMITY ANGIOGRAPHY Right 09/19/2019   Procedure: UPPER EXTREMITY ANGIOGRAPHY;  Surgeon: Katha Cabal, MD;  Location: [facility];  Service: Cardiovascular;  Laterality: Right;  . WOUND DEBRIDEMENT Left 12/21/2018   Procedure: DEBRIDEMENT WOUND;  Surgeon: Katha Cabal, MD;  Location: [facility];  Service: Vascular;   Laterality: Left;  . WOUND DEBRIDEMENT Left 12/30/2018   Procedure: DEBRIDEMENT WOUND WITH VAC PLACEMENT (LEFT UPPER EXTREMITY);  Surgeon: Katha Cabal, MD;  Location: [facility];  Service: Vascular;  Laterality: Left;     reports that she quit smoking about 39 years ago. Her smoking use included cigarettes. She has a 1.50 pack-year smoking history. She has never used smokeless tobacco. She reports current alcohol use. She reports previous drug use. Drug: Marijuana.  Allergies  Allergen Reactions  . Shellfish Allergy Anaphylaxis and Swelling  . Diazepam Other (See Comments)    "felt like out of body experience"    Family History  Problem Relation Age of Onset  . Colon cancer Mother   . Heart attack Other   . Heart attack Maternal Grandmother   . Hypertension Sister   . Hypertension Brother   . Diabetes Paternal Grandmother   . Breast cancer Neg Hx     Prior to Admission medications   Medication Sig Start Date End Date Taking? Authorizing Provider  albuterol (VENTOLIN HFA) 108 (90 Base) MCG/ACT inhaler Inhale 2 puffs into the lungs every 4 (four) hours as needed for wheezing or shortness of breath. 06/28/20   [name], PA-C  allopurinol (ZYLOPRIM) 100 MG tablet TAKE 1 TABLET BY MOUTH ONCE DAILY. Patient taking differently: Take 100 mg by mouth daily. 12/08/19   [name], PA-C  aspirin EC 81 MG EC tablet Take 1 tablet (81 mg total) by mouth daily. 09/19/16   Strader, Fransisco Hertz, PA-C  atorvastatin (LIPITOR) 80 MG tablet TAKE 1 TABLET BY MOUTH ONCE DAILY AT 6PM Patient taking differently: Take 80 mg by mouth every evening. 07/08/20   [name], PA-C  AURYXIA 1 GM 210 MG(Fe) tablet Take 2 tablets (420 mg total) by mouth 3 (three) times daily with meals. 05/03/20   [name], PA-C  b complex-vitamin c-folic acid (NEPHRO-VITE) 0.8 MG TABS tablet Take 1 tablet by mouth daily.     [provider]  buPROPion (WELLBUTRIN SR) 150 MG 12  hr tablet Take 1 tablet (150 mg total) by mouth 2 (two) times daily. 05/03/20   [name], PA-C  carvedilol (COREG) 25 MG tablet TAKE (1) TABLET BY MOUTH TWICE A DAY WITH MEALS (BREAKFAST AND SUPPER) Patient taking differently: Take 25 mg by mouth 2 (two) times daily with a meal. 07/15/20   Burnette, Clearnce Sorrel, PA-C  Cinacalcet HCl (SENSIPAR PO) Take 30 mg by mouth every dialysis. 01/04/20 01/02/21  [provider]  Continuous Blood Gluc Receiver (FREESTYLE LIBRE 14 DAY READER) DEVI To check blood sugar ACHS 05/24/20   [name], PA-C  Continuous Blood Gluc Sensor (FREESTYLE LIBRE 14 DAY SENSOR) MISC To check blood sugar ACHS; change  every 14 days 05/24/20   [name], PA-C  cyclobenzaprine (FLEXERIL) 5 MG tablet Take 1 tablet (5 mg total) by mouth 3 (three) times daily as needed for muscle spasms. 08/10/19   [name], PA-C  dicyclomine (BENTYL) 20 MG tablet TAKE 1 TABLET(20 MG) BY MOUTH FOUR TIMES DAILY BEFORE MEALS AND AT BEDTIME Patient taking differently: Take 20 mg by mouth 4 (four) times daily -  before meals and at bedtime. 10/16/19   [name], PA-C  docusate sodium (COLACE) 100 MG capsule Take 1 capsule (100 mg total) by mouth 2 (two) times daily as needed for mild constipation. 09/20/17   Gouru, Illene Silver, MD  doxepin (SINEQUAN) 10 MG capsule TAKE 1 CAPSULE(10 MG) BY MOUTH FOUR TIMES DAILY AS NEEDED FOR ITCHING Patient taking differently: Take 10 mg by mouth 4 (four) times daily as needed (itching). 05/03/20   [name], PA-C  DULERA 200-5 MCG/ACT AERO Inhale 2 puffs into the lungs 2 (two) times daily as needed.  11/26/19   [provider]  fluticasone (FLONASE) 50 MCG/ACT nasal spray Place 2 sprays into both nostrils daily as needed for allergies or rhinitis. 09/28/17   [name], PA-C  fluticasone furoate-vilanterol (BREO ELLIPTA) 200-25 MCG/INH AEPB Inhale 1 puff into the lungs daily. 08/30/20   [name], PA-C  gentamicin cream (GARAMYCIN) 0.1 % Apply 1 application topically 2 (two) times daily. Patient taking differently: Apply 1 application topically 2 (two) times daily as needed. 11/20/19   Edrick Kins, DPM  hydrALAZINE (APRESOLINE) 100 MG tablet TAKE (1) TABLET BY MOUTH THREE TIMES DAILY Patient taking differently: Take 100 mg by mouth 3 (three) times daily. 07/15/20   [name], PA-C  hydrOXYzine (ATARAX/VISTARIL) 25 MG tablet Take 1 tablet (25 mg total) by mouth 4 (four) times daily. 05/03/20   [name], PA-C  insulin detemir (LEVEMIR FLEXTOUCH) 100 UNIT/ML FlexPen Inject 60 Units into the skin 2 (two) times daily. 05/24/20   [name], PA-C  insulin lispro (HUMALOG KWIKPEN) 100 UNIT/ML KwikPen Inject 25 Units into the skin with breakfast, with lunch, and with evening meal. 05/14/20   Burnette, Clearnce Sorrel, PA-C  lidocaine-prilocaine (EMLA) cream Apply 1 application topically as needed. 03/27/19   Schnier, Dolores Lory, MD  LOPERAMIDE HCL PO Take 1 tablet by mouth as needed.  10/21/19 10/19/20  [provider]  loratadine (CLARITIN) 10 MG tablet TAKE 1 TABLET BY MOUTH ONCE DAILY. Patient taking differently: Take 10 mg by mouth daily. 07/15/20   [name], PA-C  mometasone (ELOCON) 0.1 % cream APPLY AS DIRECTED TO AFFECTED AREA ONCE DAILY. Patient taking differently: Apply 1 application topically daily as needed. 12/08/18   [name], PA-C  montelukast (SINGULAIR) 10 MG tablet TAKE ONE TABLET BY MOUTH AT BEDTIME. Patient taking differently: Take 10 mg by mouth at bedtime. 07/15/20   [name], PA-C  nitroGLYCERIN (NITROSTAT) 0.4 MG SL tablet Place 1 tablet (0.4 mg total) under the tongue every 5 (five) minutes x 3 doses as needed for chest pain. 05/03/20   [name], PA-C  Nutritional Supplements (FEEDING SUPPLEMENT, NEPRO CARB STEADY,) LIQD Take 237 mLs by mouth daily. 02/01/20   Loletha Grayer, MD  nystatin  (MYCOSTATIN) 100000 UNIT/ML suspension Take 5 mLs (500,000 Units total) by mouth 4 (four) times daily. 06/28/20   [name], PA-C  nystatin cream (MYCOSTATIN) Apply 1 application topically 2 (two) times daily. Patient taking differently: Apply  1 application topically daily as needed for dry skin. 08/10/19   [name], PA-C  omeprazole (PRILOSEC) 20 MG capsule TAKE (1) CAPSULE BY MOUTH ONCE DAILY. Patient taking differently: Take 20 mg by mouth daily. 07/15/20   [name], PA-C  oxyCODONE (ROXICODONE) 5 MG immediate release tablet Take 1 tablet (5 mg total) by mouth every 12 (twelve) hours as needed for severe pain. 08/30/20   [name], PA-C  pregabalin (LYRICA) 150 MG capsule TAKE 1 CAPSULE(150 MG) BY MOUTH TWICE DAILY Patient taking differently: Take 150 mg by mouth 2 (two) times daily. 02/29/20   Burnette, Jennifer M, PA-C  SENNA-PLUS 8.6-50 MG tablet TAKE ONE TABLET BY MOUTH AT BEDTIME. Patient taking differently: Take 1 tablet by mouth at bedtime. 07/15/20   [name], PA-C  silver sulfADIAZINE (SILVADENE) 1 % cream Apply 1 application topically daily. Apply to right first toe Patient taking differently: Apply 1 application topically daily as needed (pain). 02/21/19   Kris Hartmann, NP  torsemide (DEMADEX) 20 MG tablet TAKE (2) TABLETS BY MOUTH TWICE DAILY. Patient taking differently: Take 40 mg by mouth 2 (two) times daily. 05/21/20   [name], PA-C  triamcinolone cream (KENALOG) 0.1 % APPLY TO AFFECTED AREA TWICE DAILY Patient taking differently: 2 (two) times daily as needed. 06/17/18   [name], PA-C  valACYclovir (VALTREX) 500 MG tablet TAKE (1) TABLET BY MOUTH EVERY OTHER DAY. Patient taking differently: Take 500 mg by mouth every other day. 09/06/20   [name], PA-C  VICTOZA 18 MG/3ML SOPN Start with 0.24m daily and increase by 0.642mper week until max dose of 1.8 mg dose achieved. 03/25/20   BuMar DaringPA-C  Vitamin D, Ergocalciferol, (DRISDOL) 1.25 MG (50000 UNIT) CAPS capsule TAKE 1 CAPSULE BY MOUTH ONCE A MONTH 07/17/20   BuMar DaringPA-C  gabapentin (NEURONTIN) 100 MG capsule Take 1 capsule (100 mg total) by mouth 3 (three) times daily. 08/19/17 02/03/19  PaFritzi MandesMD    Physical Exam:  Constitutional: Morbidly obese female currently in no acute distress Vitals:   09/13/20 0450 09/13/20 0500 09/13/20 0600 09/13/20 0730  BP:  (!) 160/95 (!) 167/83 (!) 182/87  Pulse:  77 74 74  Resp:  15 (!) 23 12  Temp:      TempSrc:      SpO2: 100% 100% 100% 100%  Weight:      Height:       Eyes: PERRL, lids and conjunctivae normal ENMT: Mucous membranes are moist. Posterior pharynx clear of any exudate or lesions.  Neck: normal, supple, no masses, no thyromegaly.  Respiratory: Normal respiratory effort at this time without significant wheezes or crackles appreciated at this time.  Patient was able to be taken off of BiPAP and wean to 2 L nasal cannula oxygen. Cardiovascular: Regular rate and rhythm, no murmurs / rubs / gallops.  +1 pitting edema bilateral lower extremities. 2+ pedal pulses. No carotid bruits.  Left upper extremity fistula. Abdomen: Large pannus, no masses palpated. No hepatosplenomegaly. Bowel sounds positive.  Ostomy present on the left stomach wall with normal appearing stool Musculoskeletal: no clubbing / cyanosis. No joint deformity upper and lower extremities. Good ROM, no contractures. Normal muscle tone.  Skin: Wound present on right groin that appears to be healing from what can be seen and has a wick Neurologic: CN 2-12 grossly intact. Sensation intact, DTR normal. Strength 5/5 in all 4.  Psychiatric: Normal judgment and  insight. Alert and oriented x 3. Normal mood.     Labs on Admission: I have personally reviewed following labs and imaging studies  CBC: Recent Labs  Lab 09/13/20 0445 09/13/20 0512 09/13/20 0537  WBC 4.4  --   --    NEUTROABS 2.7  --   --   HGB 9.1* 10.2* 10.5*  HCT 29.4* 30.0* 31.0*  MCV 101.7*  --   --   PLT 136*  --   --    Basic Metabolic Panel: Recent Labs  Lab 09/13/20 0445 09/13/20 0512 09/13/20 0537 09/13/20 0556  NA 130* 134* 134* 130*  K 4.0 4.1 4.1 4.3  CL 92*  --  93* 93*  CO2 25  --   --  23  GLUCOSE 659*  --  679* 604*  BUN 29*  --  29* 29*  CREATININE 5.71*  --  5.50* 5.65*  CALCIUM 8.0*  --   --  8.1*   GFR: Estimated Creatinine Clearance: 14.7 mL/min (A) (by C-G formula based on SCr of 5.65 mg/dL (H)). Liver Function Tests: Recent Labs  Lab 09/13/20 0445  AST 42*  ALT 39  ALKPHOS 359*  BILITOT 0.6  PROT 6.5  ALBUMIN 2.9*   No results for input(s): LIPASE, AMYLASE in the last 168 hours. No results for input(s): AMMONIA in the last 168 hours. Coagulation Profile: No results for input(s): INR, PROTIME in the last 168 hours. Cardiac Enzymes: No results for input(s): CKTOTAL, CKMB, CKMBINDEX, TROPONINI in the last 168 hours. BNP (last 3 results) No results for input(s): PROBNP in the last 8760 hours. HbA1C: Recent Labs    09/13/20 0556  HGBA1C 8.4*   CBG: Recent Labs  Lab 09/13/20 0622 09/13/20 0751  GLUCAP 537* 461*   Lipid Profile: No results for input(s): CHOL, HDL, LDLCALC, TRIG, CHOLHDL, LDLDIRECT in the last 72 hours. Thyroid Function Tests: No results for input(s): TSH, T4TOTAL, FREET4, T3FREE, THYROIDAB in the last 72 hours. Anemia Panel: No results for input(s): VITAMINB12, FOLATE, FERRITIN, TIBC, IRON, RETICCTPCT in the last 72 hours. Urine analysis:    Component Value Date/Time   COLORURINE AMBER (A) 12/13/2019 1258   APPEARANCEUR CLOUDY (A) 12/13/2019 1258   LABSPEC 1.017 12/13/2019 1258   PHURINE 5.0 12/13/2019 1258   GLUCOSEU NEGATIVE 12/13/2019 1258   HGBUR NEGATIVE 12/13/2019 1258   BILIRUBINUR NEGATIVE 12/13/2019 1258   BILIRUBINUR neg 12/17/2016 1538   KETONESUR NEGATIVE 12/13/2019 1258   PROTEINUR 30 (A) 12/13/2019 1258    UROBILINOGEN 0.2 12/17/2016 1538   UROBILINOGEN 0.2 05/29/2010 2100   NITRITE NEGATIVE 12/13/2019 1258   LEUKOCYTESUR LARGE (A) 12/13/2019 1258   Sepsis Labs: Recent Results (from the past 240 hour(s))  Resp Panel by RT-PCR (Flu A&B, Covid) Nasopharyngeal Swab     Status: None   Collection Time: 09/13/20  4:45 AM   Specimen: Nasopharyngeal Swab; Nasopharyngeal(NP) swabs in vial transport medium  Result Value Ref Range Status   SARS Coronavirus 2 by RT PCR NEGATIVE NEGATIVE Final    Comment: (NOTE) SARS-CoV-2 target nucleic acids are NOT DETECTED.  The SARS-CoV-2 RNA is generally detectable in upper respiratory specimens during the acute phase of infection. The lowest concentration of SARS-CoV-2 viral copies this assay can detect is 138 copies/mL. A negative result does not preclude SARS-Cov-2 infection and should not be used as the sole basis for treatment or other patient management decisions. A negative result may occur with  improper specimen collection/handling, submission of specimen other than nasopharyngeal swab, presence of  viral mutation(s) within the areas targeted by this assay, and inadequate number of viral copies(<138 copies/mL). A negative result must be combined with clinical observations, patient history, and epidemiological information. The expected result is Negative.  Fact Sheet for Patients:  EntrepreneurPulse.com.au  Fact Sheet for Healthcare Providers:  IncredibleEmployment.be  This test is no t yet approved or cleared by the [place] FDA and  has been authorized for detection and/or diagnosis of SARS-CoV-2 by FDA under an Emergency Use Authorization (EUA). This EUA will remain  in effect (meaning this test can be used) for the duration of the COVID-19 declaration under Section 564(b)(1) of the Act, 21 U.S.C.section 360bbb-3(b)(1), unless the authorization is terminated  or revoked sooner.       Influenza A by  PCR NEGATIVE NEGATIVE Final   Influenza B by PCR NEGATIVE NEGATIVE Final    Comment: (NOTE) The Xpert Xpress SARS-CoV-2/FLU/RSV plus assay is intended as an aid in the diagnosis of influenza from Nasopharyngeal swab specimens and should not be used as a sole basis for treatment. Nasal washings and aspirates are unacceptable for Xpert Xpress SARS-CoV-2/FLU/RSV testing.  Fact Sheet for Patients: EntrepreneurPulse.com.au  Fact Sheet for Healthcare Providers: IncredibleEmployment.be  This test is not yet approved or cleared by the [place] FDA and has been authorized for detection and/or diagnosis of SARS-CoV-2 by FDA under an Emergency Use Authorization (EUA). This EUA will remain in effect (meaning this test can be used) for the duration of the COVID-19 declaration under Section 564(b)(1) of the Act, 21 U.S.C. section 360bbb-3(b)(1), unless the authorization is terminated or revoked.  Performed at [facility], [place] [address], [place], [place] [address]      Radiological Exams on Admission: DG Chest Portable 1 View  Result Date: 09/13/2020 CLINICAL DATA:  Shortness of breath EXAM: PORTABLE CHEST 1 VIEW COMPARISON:  01/30/2020 FINDINGS: Cardiac shadow is prominent but stable. Mild vascular congestion is seen increased from the prior exam. No pulmonary edema is noted. Mild right basilar atelectasis is seen. IMPRESSION: Mild vascular congestion and right basilar atelectasis. No focal confluent infiltrate is noted. Electronically Signed   By: Inez Catalina M.D.   On: 09/13/2020 05:22    EKG: Independently reviewed.  Sinus rhythm at 77 bpm with a QTC of 26.  Assessment/Plan Acute respiratory failure with hypoxia secondary to volume overload: Patient presented with complaints of acute shortness of breath.  Initially noted to be hypoxic and placed on BiPAP.  Chest x-Starace noting mild vascular congestion with right basilar atelectasis.  BNP was  elevated 249.6.  She had missed her hemodialysis session on 3/3 due to the patient warning that she was sick.  Nephrology was formally consulted and advised the patient. -Admit to a progressive bed -Continuous pulse oximetry with nasal cannula oxygen to maintain O2 saturation greater than 90% -BiPAP as needed -HD per nephrology  Uncontrolled diabetes mellitus type 2 with hyperosmolar nonketotic hyperglycemia: Acute.  Patient presented with glucose elevated up to 604 with CO2 23 and anion gap within normal limits at 14.  Osmolarity was elevated at 312.  Hemoglobin A1c elevated 8.4. -Hypoglycemic protocols -Continue insulin drip and plan to switch to subcu insulin once blood sugars less than 300.  Plan to start glargine 25 units twice daily and CBGs before every meal with moderate SSI.  ESRD on HD: Patient normally dialyzes Tuesday, Thursday, Saturday.  Last hemodialysis session on 09/10/2020.  Labs significant for potassium 4, BUN 29, creatinine 5.71. -Continue current medication regimen -Appreciate nephrology  consultative services  Necrotizing fasciitis s/p ostomy: Patient hospitalized 07/11/2020 for necrotizing fasciitis of groin requiring multiple surgeries and debridements and ultimately a colostomy placement.  Wound has been healing. -Wound care consult -Ostomy care -Continue outpatient follow-up with [facility] surgery  Hypertensive urgency: Acute.  On admission blood pressures elevated up to 192/85.  Home blood pressure medications include Coreg 25 mg twice daily, hydralazine 100 mg 3 times daily, and torsemide 40 mg twice daily. -Continue current medication regimen as tolerated -Hydralazine IV as needed for systolic blood pressures greater than 180.  COPD, without acute exacerbation: Patient not currently wheezing on physical exam. -Continue Breo and albuterol nebs as needed  Anemia of chronic disease: Last hemoglobin 10.5 g/dL on repeat H&H checks. -Continue to monitor  Secondary  hyperparathyroidism -Receiving hectorol with HD -Holding Sensipar per nephrology  Elevated liver function studies: Alkaline phosphatase 359 and AST 42.  Hypoalbuminemia: Albumin 2.9. -Check prealbumin in a.m.  Morbid obesity: BMI 45.69 kg/m  DVT prophylaxis: Heparin Code Status: Full Family Communication: Daughter updated over the phone Disposition Plan: Hopefully discharge home in 2 to 3 days. Consults called: Nephrology Admission status: Inpatient, due to acute respiratory failure and need HD  Norval Morton MD [facility]   If 7PM-7AM, please contact night-coverage   09/13/2020, 8:11 AM

## 2020-09-13 NOTE — Procedures (Signed)
Patient was seen on dialysis and the procedure was supervised.  BFR 400  Via AVF BP is  165/82.   Patient appears to be tolerating treatment well-  C/o headache-  Likely due to high BP-  Feeling better with fluid off-  Will do HD again tomorrow to get back on schedule and for more volume removal   Louis Meckel 09/13/2020

## 2020-09-13 NOTE — ED Notes (Signed)
Report given to Dialysis nurse.   

## 2020-09-13 NOTE — Progress Notes (Signed)
Placed patient on bipap.

## 2020-09-13 NOTE — Progress Notes (Addendum)
Inpatient Diabetes Program Recommendations  AACE/ADA: New Consensus Statement on Inpatient Glycemic Control (2015)  Target Ranges:  Prepandial:   less than 140 mg/dL      Peak postprandial:   less than 180 mg/dL (1-2 hours)      Critically ill patients:  140 - 180 mg/dL   Lab Results  Component Value Date   GLUCAP 365 (H) 09/13/2020   HGBA1C 8.4 (H) 09/13/2020    Review of Glycemic Control Results for TWYLA, DAIS (MRN [id number]) as of 09/13/2020 09:21  Ref. Range 09/13/2020 06:22 09/13/2020 07:51 09/13/2020 08:27 09/13/2020 09:10  Glucose-Capillary Latest Ref Range: 70 - 99 mg/dL 537 (HH) 461 (H) 471 (H) 365 (H)   Diabetes history: DM 2 Outpatient Diabetes medications:  Lantus 60 units bid, Humalog 25 unit tid with meals, Victoza 1.8 mg daily Current orders for Inpatient glycemic control:  IV insulin-  Inpatient Diabetes Program Recommendations:    Agree with current orders. Once blood sugars within goal of 140-180 mg/dL, Consider Lantus 30 units bid, Novolog sensitive tid with meals and HS, and Novolog meal coverage 4 units tid with meals (hold if patient eats less than 50% or NPO).   Thanks,  Adah Perl, RN, BC-ADM Inpatient Diabetes Coordinator Pager [phone] (8a-5p)

## 2020-09-13 NOTE — Progress Notes (Signed)
Per RT assessment: pt not placed on BIPAP tonight. Pt stable on 5L [place]. Pt stable with no complaints.

## 2020-09-13 NOTE — ED Notes (Signed)
Patient transported to Dialysis via stretcher on cardiac and O2 monitor with respiratory therapist.

## 2020-09-14 DIAGNOSIS — J810 Acute pulmonary edema: Secondary | ICD-10-CM | POA: Diagnosis present

## 2020-09-14 DIAGNOSIS — J9601 Acute respiratory failure with hypoxia: Secondary | ICD-10-CM | POA: Diagnosis not present

## 2020-09-14 DIAGNOSIS — R0602 Shortness of breath: Secondary | ICD-10-CM | POA: Diagnosis not present

## 2020-09-14 DIAGNOSIS — K264 Chronic or unspecified duodenal ulcer with hemorrhage: Secondary | ICD-10-CM | POA: Diagnosis not present

## 2020-09-14 HISTORY — DX: Acute pulmonary edema: J81.0

## 2020-09-14 LAB — RENAL FUNCTION PANEL
Albumin: 2.7 g/dL — ABNORMAL LOW (ref 3.5–5.0)
Anion gap: 10 (ref 5–15)
BUN: 17 mg/dL (ref 8–23)
CO2: 27 mmol/L (ref 22–32)
Calcium: 8.4 mg/dL — ABNORMAL LOW (ref 8.9–10.3)
Chloride: 93 mmol/L — ABNORMAL LOW (ref 98–111)
Creatinine, Ser: 4.07 mg/dL — ABNORMAL HIGH (ref 0.44–1.00)
GFR, Estimated: 12 mL/min — ABNORMAL LOW (ref >60)
Glucose, Bld: 392 mg/dL — ABNORMAL HIGH (ref 70–99)
Phosphorus: 3.8 mg/dL (ref 2.5–4.6)
Potassium: 4.2 mmol/L (ref 3.5–5.1)
Sodium: 130 mmol/L — ABNORMAL LOW (ref 135–145)

## 2020-09-14 LAB — CBC
HCT: 28.4 % — ABNORMAL LOW (ref 36.0–46.0)
Hemoglobin: 9.4 g/dL — ABNORMAL LOW (ref 12.0–15.0)
MCH: 32.4 pg (ref 26.0–34.0)
MCHC: 33.1 g/dL (ref 30.0–36.0)
MCV: 97.9 fL (ref 80.0–100.0)
Platelets: 141 10*3/uL — ABNORMAL LOW (ref 150–400)
RBC: 2.9 MIL/uL — ABNORMAL LOW (ref 3.87–5.11)
RDW: 15 % (ref 11.5–15.5)
WBC: 5.3 10*3/uL (ref 4.0–10.5)
nRBC: 0 % (ref 0.0–0.2)

## 2020-09-14 LAB — PREALBUMIN: Prealbumin: 15.5 mg/dL — ABNORMAL LOW (ref 18–38)

## 2020-09-14 LAB — GLUCOSE, CAPILLARY: Glucose-Capillary: 317 mg/dL — ABNORMAL HIGH (ref 70–99)

## 2020-09-14 MED ORDER — DOXERCALCIFEROL 4 MCG/2ML IV SOLN
INTRAVENOUS | Status: AC
  Filled 2020-09-14: qty 2

## 2020-09-14 NOTE — Progress Notes (Signed)
Subjective:  Removed 5 liters yest with HD- Seen on HD for second day in a row, goal for 4 liters-  "my body is so tired"  Up all night watching tv so sleepy this AM- says colostomy bag is not sealed properly   Objective Vital signs in last 24 hours: Vitals:   09/14/20 0735 09/14/20 0746 09/14/20 0815 09/14/20 0830  BP: (!) 188/66 (!) 190/75 (!) 167/76 (!) 161/72  Pulse: 77 75 73 75  Resp: 19 12  17   Temp: 98.1 F (36.7 C)     TempSrc: Oral     SpO2: 99%     Weight: 130.2 kg     Height:       Weight change: 6.571 kg  Intake/Output Summary (Last 24 hours) at 09/14/2020 0845 Last data filed at 09/14/2020 0445 Gross per 24 hour  Intake 57.42 ml  Output 5750 ml  Net -5692.58 ml   Outpt HD orders:  [facility] TTS 4hrs BFR 450 DFR 500 EDW 126.5 2K/2Ca last tx 3/1 post wt 124.3 ran full 4 hrs mircera 75 q2 last 2/22 2/24 Phos 3.8, Ca 8.3, PTH 510 hectorol 4 TIW, sensipar 30 TIW  Assessment/Plan **Acute hypoxic respiratory failure:  Presumably volume overload but in setting of GI illness could have aspirated though CXR now showing that.  HD this AM, again with max UF.  **ESRD on HD:  HD Friday off schedule then today to get on schedule.  Post wt, likely will need new EDW post discharge.  She is a big gainer in between treatments-  counseled again.  Would try for next treatment on Tuesday on schedule via AVF   **Secondary hyperPTH:    Cont hectorol with HD here but holding sensipar given low ionized Ca-  Will resume next treatment  Cont binder auryxia.   **Anemia:  Hb 10.5- 9.4, not due for ESA at this time.   **HTN:   Cont outpt meds- coreg and hydralazine-  and max UF as able - hopefully will be better after HD today   **DM:  Insulin per primary  Dispo-  Per primary-  Maybe could be weaned off of O2 today and discharged ?    [name]    Labs: Basic Metabolic Panel: Recent Labs  Lab 09/13/20 0944 09/13/20 1822 09/14/20 0206  NA 135 136 130*  K 4.2 4.1 4.2   CL 95* 95* 93*  CO2 28 29 27   GLUCOSE 231* 220* 392*  BUN 27* 14 17  CREATININE 5.72* 3.65* 4.07*  CALCIUM 8.3* 9.0 8.4*  PHOS  --   --  3.8   Liver Function Tests: Recent Labs  Lab 09/13/20 0445 09/14/20 0206  AST 42*  --   ALT 39  --   ALKPHOS 359*  --   BILITOT 0.6  --   PROT 6.5  --   ALBUMIN 2.9* 2.7*   No results for input(s): LIPASE, AMYLASE in the last 168 hours. No results for input(s): AMMONIA in the last 168 hours. CBC: Recent Labs  Lab 09/13/20 0445 09/13/20 0512 09/13/20 0537 09/14/20 0206  WBC 4.4  --   --  5.3  NEUTROABS 2.7  --   --   --   HGB 9.1* 10.2* 10.5* 9.4*  HCT 29.4* 30.0* 31.0* 28.4*  MCV 101.7*  --   --  97.9  PLT 136*  --   --  141*   Cardiac Enzymes: No results for input(s): CKTOTAL, CKMB, CKMBINDEX, TROPONINI in the last 168  hours. CBG: Recent Labs  Lab 09/13/20 0910 09/13/20 1018 09/13/20 1119 09/13/20 1801 09/13/20 2147  GLUCAP 365* 323* 239* 209* 358*    Iron Studies: No results for input(s): IRON, TIBC, TRANSFERRIN, FERRITIN in the last 72 hours. Studies/Results: DG Chest Portable 1 View  Result Date: 09/13/2020 CLINICAL DATA:  Shortness of breath EXAM: PORTABLE CHEST 1 VIEW COMPARISON:  01/30/2020 FINDINGS: Cardiac shadow is prominent but stable. Mild vascular congestion is seen increased from the prior exam. No pulmonary edema is noted. Mild right basilar atelectasis is seen. IMPRESSION: Mild vascular congestion and right basilar atelectasis. No focal confluent infiltrate is noted. Electronically Signed   By: Inez Catalina M.D.   On: 09/13/2020 05:22   Medications: Infusions:   Scheduled Medications: . allopurinol  100 mg Oral Daily  . aspirin EC  81 mg Oral Daily  . atorvastatin  80 mg Oral QPM  . buPROPion  150 mg Oral BID  . carvedilol  25 mg Oral BID WC  . Chlorhexidine Gluconate Cloth  6 each Topical Q0600  . dicyclomine  20 mg Oral TID AC & HS  . doxepin  10 mg Oral BID  . doxercalciferol  4 mcg Intravenous  Q T,Th,Sa-HD  . ferric citrate  420 mg Oral TID WC  . fluticasone furoate-vilanterol  1 puff Inhalation Daily  . heparin  5,000 Units Subcutaneous Q8H  . hydrALAZINE  100 mg Oral TID  . insulin aspart  0-15 Units Subcutaneous TID WC  . insulin aspart  0-5 Units Subcutaneous QHS  . insulin glargine  25 Units Subcutaneous BID  . sevelamer carbonate  800 mg Oral TID WC  . sodium chloride flush  3 mL Intravenous Q12H  . torsemide  40 mg Oral BID  . valACYclovir  500 mg Oral QODAY    have reviewed scheduled and prn medications.  Physical Exam: General: somnolent but arousable-  Breathing is better-  Seen on HD Heart: RRR Lungs: poor effort, dec BS at bases Abdomen: obese-  ostomy Extremities: pitting edema Dialysis Access: left AVF-  patent    09/14/2020,8:45 AM  LOS: 1 day

## 2020-09-14 NOTE — Procedures (Signed)
Patient was seen on dialysis and the procedure was supervised.  BFR 350  Via AVF BP is  160/70.   Patient appears to be tolerating treatment well  Louis Meckel 09/14/2020

## 2020-09-14 NOTE — Care Management Obs Status (Signed)
[place] NOTIFICATION   Patient Details  Name: ADREANA COULL MRN: [id number] Date of Birth: 1956-04-11   Medicare Observation Status Notification Given:  Yes    Carles Collet, RN 09/14/2020, 10:40 AM

## 2020-09-14 NOTE — Progress Notes (Signed)
CN placed new colostomy bag. Pt complaining of stomach pains. MD paged and notified

## 2020-09-14 NOTE — Discharge Summary (Signed)
Physician Discharge Summary  [name] ATF:[id number] DOB: 10/24/1955 DOA: 09/13/2020  PCP: [name], PA-C  Admit date: 09/13/2020 Discharge date: 09/14/2020 30 Day Unplanned Readmission Risk Score   Flowsheet Row ED to Hosp-Admission (Current) from 09/13/2020 in [facility]  30 Day Unplanned Readmission Risk Score (%) 29.04 Filed at 09/14/2020 0800     This score is the patient's risk of an unplanned readmission within 30 days of being discharged (0 -100%). The score is based on dignosis, age, lab data, medications, orders, and past utilization.   Low:  0-14.9   Medium: 15-21.9   High: 22-29.9   Extreme: 30 and above         Admitted From: Home Disposition: Home  Recommendations for Outpatient Follow-up:  1. Follow up with PCP in 1-2 weeks 2. Please follow-up with routine hemodialysis as outpatient 3. Please obtain BMP/CBC in one week 4. Please follow up with your PCP on the following pending results: Unresulted Labs (From admission, onward)          Start     Ordered   09/14/20 0500  Renal function panel  Daily,   R      09/13/20 0841   Signed and Held  Renal function panel  Once,   R        Signed and Held   Signed and Held  CBC  Once,   R        Signed and [place]: None Equipment/Devices: None  Discharge Condition: Stable CODE STATUS: Full code Diet recommendation: Low-sodium  Subjective: Seen and examined today in dialysis unit.  Feels much better and breathing has improved and in fact no shortness of breath at all but she is a little sleepy today and she states that is because she did not sleep all night long since she watched TV.  She wants to go home.  HPI: [name] is a 65 y.o. female with medical history significant of ESRD on HD(TTS), CAD, uncontrolled DM type II, dysphagia, morbid obesity, HCV, and depression presents with complaints of being short of breath that started yesterday evening.   Patient complained of being unable to lay flat due to worsening of breathing.  Reported associated symptoms of a productive cough and wheezing.  Denies having any chest pain with yesterday's event, but chronically reports having chest pain symptoms. She had missed her hemodialysis session yesterday due to complaints of her being sick with a stomach bug.  Complained of having nausea, vomiting, and severe pain in her groin.  Nausea and vomiting symptoms have since resolved.  Patient had been hospitalized 07/11/2020 at [facility] found to have a necrotizing fasciitis groin and multiple treatments with ostomy placement.  She reports that the wound has been healing well.    EMS found patient have O2 saturations in the 70s on arrival and she was placed on a nonrebreather mask.  Given albuterol neb x5 and then changed to CPAP.  O2 saturations increased to 97%.   ED Course: Upon admission into the emergency department patient was seen to be afebrile with respirations 15-23, blood pressure 148/88 -167/83, and O2 saturation currently maintained on BiPAP.  Labs were significant for WBC 4.4, hemoglobin 9.1 with repeat check 10.5, sodium 130, chloride 92, CO2 25, BUN 29, creatinine 5.71, calcium 8, alkaline phosphatase 359, AST 42, BNP 249.6, osmolarity 312, and troponin 16.  COVID-19 screening was negative.  Significant for mild vascular congestion and right basilar atelectasis with no confluent infiltrate appreciated.  Nephrology was formally consulted and set the patient up to have hemodialysis.  Due to elevated blood sugars she was started on insulin drip.  Brief/Interim Summary: Patient was admitted under hospital service due to acute pulmonary edema/fluid overload due to missing hemodialysis.  Nephrology was consulted and she received dialysis with 5 L removal on the day of admission and then she is receiving another session of dialysis with the plan of removal of 4 L fluid.  She also came in with  hypertensive urgency which was also secondary to missing hemodialysis.  Also had significant hypoglycemia with blood sugar up to 600 but no DKA.  She was started on insulin drip which was discontinued shortly after.  Her blood sugar improved and currently in 200 range.  Her hemoglobin A1c is 8.4 which is improved from over 10 on the last check.  Her hemoglobin remained stable, she has chronic kidney disease and for that she has anemia of chronic disease.  Since patient is feeling fine and wants to go home, I personally discussed with Dr. Moshe Cipro of nephrology who has cleared the patient for discharge after dialysis so she will be discharged stable condition.  Discharge Diagnoses:  Principal Problem:   Acute respiratory failure with hypoxia (HCC) Active Problems:   Uncontrolled type 2 diabetes mellitus with hyperosmolar nonketotic hyperglycemia (HCC)   Morbid (severe) obesity due to excess calories (HCC)   ESRD (end stage renal disease) on dialysis (HCC)   Volume overload   BMI 45.0-49.9, adult ([place])   Acute pulmonary edema (HCC)    Discharge Instructions   Allergies as of 09/14/2020      Reactions   Shellfish Allergy Anaphylaxis, Swelling   Diazepam Other (See Comments)   "felt like out of body experience"      Medication List    TAKE these medications   albuterol 108 (90 Base) MCG/ACT inhaler Commonly known as: VENTOLIN HFA Inhale 2 puffs into the lungs every 4 (four) hours as needed for wheezing or shortness of breath.   allopurinol 100 MG tablet Commonly known as: ZYLOPRIM TAKE 1 TABLET BY MOUTH ONCE DAILY.   aspirin 81 MG EC tablet Take 1 tablet (81 mg total) by mouth daily.   atorvastatin 80 MG tablet Commonly known as: LIPITOR TAKE 1 TABLET BY MOUTH ONCE DAILY AT 6PM What changed:   how much to take  how to take this  when to take this  additional instructions   Auryxia 1 GM 210 MG(Fe) tablet Generic drug: ferric citrate Take 2 tablets (420 mg total) by  mouth 3 (three) times daily with meals.   buPROPion 150 MG 12 hr tablet Commonly known as: Wellbutrin SR Take 1 tablet (150 mg total) by mouth 2 (two) times daily.   carvedilol 25 MG tablet Commonly known as: COREG TAKE (1) TABLET BY MOUTH TWICE A DAY WITH MEALS (BREAKFAST AND SUPPER) What changed: See the new instructions.   cyclobenzaprine 5 MG tablet Commonly known as: FLEXERIL Take 1 tablet (5 mg total) by mouth 3 (three) times daily as needed for muscle spasms.   dicyclomine 20 MG tablet Commonly known as: BENTYL TAKE 1 TABLET(20 MG) BY MOUTH FOUR TIMES DAILY BEFORE MEALS AND AT BEDTIME What changed: See the new instructions.   docusate sodium 100 MG capsule Commonly known as: COLACE Take 1 capsule (100 mg total) by mouth 2 (two) times daily as needed for mild constipation.  doxepin 10 MG capsule Commonly known as: SINEQUAN TAKE 1 CAPSULE(10 MG) BY MOUTH FOUR TIMES DAILY AS NEEDED FOR ITCHING What changed:   how much to take  how to take this  when to take this  additional instructions   Dulera 200-5 MCG/ACT Aero Generic drug: mometasone-formoterol Inhale 2 puffs into the lungs 2 (two) times daily as needed.   feeding supplement (NEPRO CARB STEADY) Liqd Take 237 mLs by mouth daily.   fluticasone 50 MCG/ACT nasal spray Commonly known as: FLONASE Place 2 sprays into both nostrils daily as needed for allergies or rhinitis.   fluticasone furoate-vilanterol 200-25 MCG/INH Aepb Commonly known as: BREO ELLIPTA Inhale 1 puff into the lungs daily.   FreeStyle Libre 14 Day Reader Kerrin Mo To check blood sugar ACHS   FreeStyle Libre 14 Day Sensor Misc To check blood sugar ACHS; change every 14 days   gentamicin cream 0.1 % Commonly known as: GARAMYCIN Apply 1 application topically 2 (two) times daily.   hydrALAZINE 100 MG tablet Commonly known as: APRESOLINE TAKE (1) TABLET BY MOUTH THREE TIMES DAILY What changed: See the new instructions.   hydrOXYzine 25 MG  tablet Commonly known as: ATARAX/VISTARIL Take 1 tablet (25 mg total) by mouth 4 (four) times daily.   insulin glargine 100 unit/mL Sopn Commonly known as: LANTUS Inject 25 Units into the skin 2 (two) times daily.   insulin lispro 100 UNIT/ML KwikPen Commonly known as: HumaLOG KwikPen Inject 25 Units into the skin with breakfast, with lunch, and with evening meal. What changed:   how much to take  when to take this   Levemir FlexTouch 100 UNIT/ML FlexPen Generic drug: insulin detemir Inject 60 Units into the skin 2 (two) times daily.   lidocaine-prilocaine cream Commonly known as: EMLA Apply 1 application topically as needed. What changed: reasons to take this   loperamide 2 MG tablet Commonly known as: IMODIUM A-D Take 1 tablet (2 mg total) by mouth as needed (diarrhea).   loratadine 10 MG tablet Commonly known as: CLARITIN TAKE 1 TABLET BY MOUTH ONCE DAILY. What changed:   when to take this  reasons to take this   mometasone 0.1 % cream Commonly known as: ELOCON APPLY AS DIRECTED TO AFFECTED AREA ONCE DAILY. What changed: See the new instructions.   montelukast 10 MG tablet Commonly known as: SINGULAIR TAKE ONE TABLET BY MOUTH AT BEDTIME. What changed:   when to take this  reasons to take this   nitroGLYCERIN 0.4 MG SL tablet Commonly known as: NITROSTAT Place 1 tablet (0.4 mg total) under the tongue every 5 (five) minutes x 3 doses as needed for chest pain.   nystatin 100000 UNIT/ML suspension Commonly known as: MYCOSTATIN Take 5 mLs (500,000 Units total) by mouth 4 (four) times daily.   nystatin cream Commonly known as: MYCOSTATIN Apply 1 application topically 2 (two) times daily. What changed:   when to take this  reasons to take this   omeprazole 20 MG capsule Commonly known as: PRILOSEC TAKE (1) CAPSULE BY MOUTH ONCE DAILY. What changed: See the new instructions.   oxyCODONE 5 MG immediate release tablet Commonly known as:  Roxicodone Take 1 tablet (5 mg total) by mouth every 12 (twelve) hours as needed for severe pain.   pregabalin 150 MG capsule Commonly known as: LYRICA TAKE 1 CAPSULE(150 MG) BY MOUTH TWICE DAILY What changed: See the new instructions.   Senna-Plus 8.6-50 MG tablet Generic drug: senna-docusate TAKE ONE TABLET BY MOUTH AT BEDTIME.  SENSIPAR PO Take 30 mg by mouth every dialysis.   sevelamer carbonate 800 MG tablet Commonly known as: RENVELA Take 800 mg by mouth 3 (three) times daily.   silver sulfADIAZINE 1 % cream Commonly known as: SILVADENE Apply 1 application topically daily. Apply to right first toe What changed: See the new instructions.   torsemide 20 MG tablet Commonly known as: DEMADEX TAKE (2) TABLETS BY MOUTH TWICE DAILY. What changed: See the new instructions.   triamcinolone 0.1 % Commonly known as: KENALOG APPLY TO AFFECTED AREA TWICE DAILY What changed: See the new instructions.   valACYclovir 500 MG tablet Commonly known as: VALTREX TAKE (1) TABLET BY MOUTH EVERY OTHER DAY. What changed: See the new instructions.   Victoza 18 MG/3ML Sopn Generic drug: liraglutide Start with 0.6mg  daily and increase by 0.6mg  per week until max dose of 1.8 mg dose achieved.   Vitamin D (Ergocalciferol) 1.25 MG (50000 UNIT) Caps capsule Commonly known as: DRISDOL TAKE 1 CAPSULE BY MOUTH ONCE A MONTH What changed:   how much to take  how to take this  when to take this  additional instructions       Follow-up Information    [name], PA-C Follow up in 1 week(s).   Specialty: Family Medicine Contact information: [place] [address] [phone]        Sherren Mocha, MD .   Specialty: Cardiology Contact information: [address] [place] [address] [phone]              Allergies  Allergen Reactions  . Shellfish Allergy Anaphylaxis and Swelling  . Diazepam Other (See  Comments)    "felt like out of body experience"    Consultations: Nephrology   Procedures/Studies: DG Chest Portable 1 View  Result Date: 09/13/2020 CLINICAL DATA:  Shortness of breath EXAM: PORTABLE CHEST 1 VIEW COMPARISON:  01/30/2020 FINDINGS: Cardiac shadow is prominent but stable. Mild vascular congestion is seen increased from the prior exam. No pulmonary edema is noted. Mild right basilar atelectasis is seen. IMPRESSION: Mild vascular congestion and right basilar atelectasis. No focal confluent infiltrate is noted. Electronically Signed   By: Inez Catalina M.D.   On: 09/13/2020 05:22      Discharge Exam: Vitals:   09/14/20 0900 09/14/20 0930  BP: (!) 160/70 (!) 151/66  Pulse: 76 77  Resp: 14   Temp:    SpO2:     Vitals:   09/14/20 0830 09/14/20 0845 09/14/20 0900 09/14/20 0930  BP: (!) 161/72 (!) 186/84 (!) 160/70 (!) 151/66  Pulse: 75 77 76 77  Resp: 17 16 14    Temp:      TempSrc:      SpO2:      Weight:      Height:        General: Pt is alert, awake, not in acute distress Cardiovascular: RRR, S1/S2 +, no rubs, no gallops Respiratory: CTA bilaterally, no wheezing, no rhonchi Abdominal: Soft, NT, ND, bowel sounds + Extremities: no edema, no cyanosis    The results of significant diagnostics from this hospitalization (including imaging, microbiology, ancillary and laboratory) are listed below for reference.     Microbiology: Recent Results (from the past 240 hour(s))  Resp Panel by RT-PCR (Flu A&B, Covid) Nasopharyngeal Swab     Status: None   Collection Time: 09/13/20  4:45 AM   Specimen: Nasopharyngeal Swab; Nasopharyngeal(NP) swabs in vial transport medium  Result Value Ref Range Status  SARS Coronavirus 2 by RT PCR NEGATIVE NEGATIVE Final    Comment: (NOTE) SARS-CoV-2 target nucleic acids are NOT DETECTED.  The SARS-CoV-2 RNA is generally detectable in upper respiratory specimens during the acute phase of infection. The lowest concentration of  SARS-CoV-2 viral copies this assay can detect is 138 copies/mL. A negative result does not preclude SARS-Cov-2 infection and should not be used as the sole basis for treatment or other patient management decisions. A negative result may occur with  improper specimen collection/handling, submission of specimen other than nasopharyngeal swab, presence of viral mutation(s) within the areas targeted by this assay, and inadequate number of viral copies(<138 copies/mL). A negative result must be combined with clinical observations, patient history, and epidemiological information. The expected result is Negative.  Fact Sheet for Patients:  EntrepreneurPulse.com.au  Fact Sheet for Healthcare Providers:  IncredibleEmployment.be  This test is no t yet approved or cleared by the [place] FDA and  has been authorized for detection and/or diagnosis of SARS-CoV-2 by FDA under an Emergency Use Authorization (EUA). This EUA will remain  in effect (meaning this test can be used) for the duration of the COVID-19 declaration under Section 564(b)(1) of the Act, 21 U.S.C.section 360bbb-3(b)(1), unless the authorization is terminated  or revoked sooner.       Influenza A by PCR NEGATIVE NEGATIVE Final   Influenza B by PCR NEGATIVE NEGATIVE Final    Comment: (NOTE) The Xpert Xpress SARS-CoV-2/FLU/RSV plus assay is intended as an aid in the diagnosis of influenza from Nasopharyngeal swab specimens and should not be used as a sole basis for treatment. Nasal washings and aspirates are unacceptable for Xpert Xpress SARS-CoV-2/FLU/RSV testing.  Fact Sheet for Patients: EntrepreneurPulse.com.au  Fact Sheet for Healthcare Providers: IncredibleEmployment.be  This test is not yet approved or cleared by the [place] FDA and has been authorized for detection and/or diagnosis of SARS-CoV-2 by FDA under an Emergency Use  Authorization (EUA). This EUA will remain in effect (meaning this test can be used) for the duration of the COVID-19 declaration under Section 564(b)(1) of the Act, 21 U.S.C. section 360bbb-3(b)(1), unless the authorization is terminated or revoked.  Performed at [facility], [place] [address], [place], [place] [address]      Labs: BNP (last 3 results) Recent Labs    10/21/19 1257 10/31/19 1200 09/13/20 0445  BNP 99.3 236.5* 277.8*   Basic Metabolic Panel: Recent Labs  Lab 09/13/20 0445 09/13/20 0512 09/13/20 0537 09/13/20 0556 09/13/20 0944 09/13/20 1822 09/14/20 0206  NA 130*   < > 134* 130* 135 136 130*  K 4.0   < > 4.1 4.3 4.2 4.1 4.2  CL 92*  --  93* 93* 95* 95* 93*  CO2 25  --   --  23 28 29 27   GLUCOSE 659*  --  679* 604* 231* 220* 392*  BUN 29*  --  29* 29* 27* 14 17  CREATININE 5.71*  --  5.50* 5.65* 5.72* 3.65* 4.07*  CALCIUM 8.0*  --   --  8.1* 8.3* 9.0 8.4*  PHOS  --   --   --   --   --   --  3.8   < > = values in this interval not displayed.   Liver Function Tests: Recent Labs  Lab 09/13/20 0445 09/14/20 0206  AST 42*  --   ALT 39  --   ALKPHOS 359*  --   BILITOT 0.6  --   PROT 6.5  --  ALBUMIN 2.9* 2.7*   No results for input(s): LIPASE, AMYLASE in the last 168 hours. No results for input(s): AMMONIA in the last 168 hours. CBC: Recent Labs  Lab 09/13/20 0445 09/13/20 0512 09/13/20 0537 09/14/20 0206  WBC 4.4  --   --  5.3  NEUTROABS 2.7  --   --   --   HGB 9.1* 10.2* 10.5* 9.4*  HCT 29.4* 30.0* 31.0* 28.4*  MCV 101.7*  --   --  97.9  PLT 136*  --   --  141*   Cardiac Enzymes: No results for input(s): CKTOTAL, CKMB, CKMBINDEX, TROPONINI in the last 168 hours. BNP: Invalid input(s): POCBNP CBG: Recent Labs  Lab 09/13/20 0910 09/13/20 1018 09/13/20 1119 09/13/20 1801 09/13/20 2147  GLUCAP 365* 323* 239* 209* 358*   D-Dimer No results for input(s): DDIMER in the last 72 hours. Hgb A1c Recent Labs    09/13/20 0556   HGBA1C 8.4*   Lipid Profile No results for input(s): CHOL, HDL, LDLCALC, TRIG, CHOLHDL, LDLDIRECT in the last 72 hours. Thyroid function studies No results for input(s): TSH, T4TOTAL, T3FREE, THYROIDAB in the last 72 hours.  Invalid input(s): FREET3 Anemia work up No results for input(s): VITAMINB12, FOLATE, FERRITIN, TIBC, IRON, RETICCTPCT in the last 72 hours. Urinalysis    Component Value Date/Time   COLORURINE AMBER (A) 12/13/2019 1258   APPEARANCEUR CLOUDY (A) 12/13/2019 1258   LABSPEC 1.017 12/13/2019 1258   PHURINE 5.0 12/13/2019 1258   GLUCOSEU NEGATIVE 12/13/2019 1258   HGBUR NEGATIVE 12/13/2019 1258   BILIRUBINUR NEGATIVE 12/13/2019 1258   BILIRUBINUR neg 12/17/2016 1538   KETONESUR NEGATIVE 12/13/2019 1258   PROTEINUR 30 (A) 12/13/2019 1258   UROBILINOGEN 0.2 12/17/2016 1538   UROBILINOGEN 0.2 05/29/2010 2100   NITRITE NEGATIVE 12/13/2019 1258   LEUKOCYTESUR LARGE (A) 12/13/2019 1258   Sepsis Labs Invalid input(s): PROCALCITONIN,  WBC,  LACTICIDVEN Microbiology Recent Results (from the past 240 hour(s))  Resp Panel by RT-PCR (Flu A&B, Covid) Nasopharyngeal Swab     Status: None   Collection Time: 09/13/20  4:45 AM   Specimen: Nasopharyngeal Swab; Nasopharyngeal(NP) swabs in vial transport medium  Result Value Ref Range Status   SARS Coronavirus 2 by RT PCR NEGATIVE NEGATIVE Final    Comment: (NOTE) SARS-CoV-2 target nucleic acids are NOT DETECTED.  The SARS-CoV-2 RNA is generally detectable in upper respiratory specimens during the acute phase of infection. The lowest concentration of SARS-CoV-2 viral copies this assay can detect is 138 copies/mL. A negative result does not preclude SARS-Cov-2 infection and should not be used as the sole basis for treatment or other patient management decisions. A negative result may occur with  improper specimen collection/handling, submission of specimen other than nasopharyngeal swab, presence of viral mutation(s) within  the areas targeted by this assay, and inadequate number of viral copies(<138 copies/mL). A negative result must be combined with clinical observations, patient history, and epidemiological information. The expected result is Negative.  Fact Sheet for Patients:  EntrepreneurPulse.com.au  Fact Sheet for Healthcare Providers:  IncredibleEmployment.be  This test is no t yet approved or cleared by the [place] FDA and  has been authorized for detection and/or diagnosis of SARS-CoV-2 by FDA under an Emergency Use Authorization (EUA). This EUA will remain  in effect (meaning this test can be used) for the duration of the COVID-19 declaration under Section 564(b)(1) of the Act, 21 U.S.C.section 360bbb-3(b)(1), unless the authorization is terminated  or revoked sooner.  Influenza A by PCR NEGATIVE NEGATIVE Final   Influenza B by PCR NEGATIVE NEGATIVE Final    Comment: (NOTE) The Xpert Xpress SARS-CoV-2/FLU/RSV plus assay is intended as an aid in the diagnosis of influenza from Nasopharyngeal swab specimens and should not be used as a sole basis for treatment. Nasal washings and aspirates are unacceptable for Xpert Xpress SARS-CoV-2/FLU/RSV testing.  Fact Sheet for Patients: EntrepreneurPulse.com.au  Fact Sheet for Healthcare Providers: IncredibleEmployment.be  This test is not yet approved or cleared by the [place] FDA and has been authorized for detection and/or diagnosis of SARS-CoV-2 by FDA under an Emergency Use Authorization (EUA). This EUA will remain in effect (meaning this test can be used) for the duration of the COVID-19 declaration under Section 564(b)(1) of the Act, 21 U.S.C. section 360bbb-3(b)(1), unless the authorization is terminated or revoked.  Performed at [facility], [place] [address], [place], [place] [address]      Time coordinating discharge: Over 30  minutes  SIGNED:   Darliss Cheney, MD  [facility] 09/14/2020, 10:02 AM  If 7PM-7AM, please contact night-coverage [email or web address]

## 2020-09-14 NOTE — Discharge Instructions (Signed)
Pulmonary Edema Pulmonary edema is a condition in which fluid collects in the air sacs of the lung. This makes it hard for the lungs to fill with air. It also prevents the lungs from moving oxygen into the bloodstream, which can affect other organs, such as the brain and kidneys. Pulmonary edema is an emergency and should be treated immediately. There are two main types of pulmonary edema:  Cardiogenic. This means the pulmonary edema was caused by a problem with the heart.  Noncardiogenic. This means the pulmonary edema was caused by something other than the heart, such as an injury to the lung. What are the causes? This condition is commonly caused by heart failure. When this happens, the heart is not able to properly pump blood through the body. This can lead to increased pressure in the heart and blood building up in the veins around the lungs. When blood builds up in these veins, fluid gets pushed into the air sacs of the lung. Heart failure may be caused by:  Coronary artery disease.  High blood pressure.  Viral infection of the heart (myocarditis).  Leaky or stiff heart valves.  Irregular heartbeat (arrhythmia).  Fluid buildup caused by kidney problems. Other causes include:  Infection in the lungs (pneumonia), blood (sepsis), or other part of the body.  Severe injury to the chest.  Lung injury from heat or toxins, such as breathing in smoke or poisonous gas.  Inhaling vomit or water (pulmonaryaspiration).  Certain medicines.  High altitude. What are the signs or symptoms? Symptoms of this condition include:  Shortness of breath.  Coughing with frothy or bloody mucus.  Wheezing.  Feeling like you cannot get enough air.  Shallow and fast breathing.  Skin that is cool and damp, and has a pale or bluish color. How is this diagnosed? This condition is diagnosed based on:  Your medical history.  A physical exam.  Your symptoms. You may also have other tests,  including:  Chest X-[place].  Chest CT scan.  Blood tests, including checking the amount of oxygen in the blood.  Sputum culture. This test checks for infection in the mucus that you cough up from your lungs.  Electrocardiogram. This measures the electrical signals of the heart.  Echocardiogram. This uses an ultrasound to evaluate the health of the heart. How is this treated? Initial treatment for this condition focuses on relieving your symptoms. Treatment depends on the underlying cause of the condition. This may include:  Oxygen therapy. The oxygen may be given through tubes in your nose or through a face mask. In severe cases, a breathing tube is inserted into the windpipe and hooked up to a breathing machine (ventilator).  Medicines. These may include medicines to: ? Help the body get rid of extra water (diuretics). ? Help the heart pump blood properly. ? Prevent or destroy blood clots. If poor heart function is the cause, treatment may also include:  Procedures to open blocked arteries, repair damaged heart valves, or remove some of the damaged heart muscle.  A pacemaker to help with heart function.  A procedure that uses electric shocks to regulate heart rate (cardioversion). If an infection is the cause, treatment may include antibiotic medicines. Follow these instructions at home: Medicines  Take over-the-counter and prescription medicines only as told by your health care provider.  If you were prescribed an antibiotic, take it as told by your health care provider. Do not stop taking the antibiotic even if you start to feel better.    Have a plan with information about each medicine you take. This should include: ? Why you take the medicine. ? Possible side effects. ? Best time of day to take it. ? Foods to take with it, or foods to avoid when taking it. ? When to call your health care provider.  Make a list of each medicine, vitamin, or herbal supplement you take. Keep  the list with you at all times. Show it to your health care provider at each visit and before starting a new medicine. Update the list as you add or stop medicines. Lifestyle  Exercise regularly as told by your health care provider. It is important to do it safely. You can do this by: ? Pacing your activities to avoid shortness of breath or chest pain. ? Resting for at least 1 hour before and after meals. ? Asking about cardiac rehabilitation programs. These may include education, exercise plans, and counseling.  Eat a heart-healthy diet that is low in salt (sodium), saturated fat, and cholesterol. Your health care provider may recommend foods that are high in fiber, such as fresh fruits and vegetables, whole grains, and beans.  Do not use any products that contain nicotine or tobacco, such as cigarettes and e-cigarettes. If you need help quitting, ask your health care provider.   General instructions  Maintain a healthy weight.  Keep a record of your weight: ? Record your hospital or clinic weight. When you get home, compare it to your scale and record your weight. ? Weigh yourself first thing each morning after you urinate and before you eat breakfast. Wear the same amount of clothing each time. Record the weights. ? Share your weight record with your health care provider. Daily weights are important in detecting the body's retention of excess fluid. ? Tell your health care provider right away if you gain weight quickly. Your medicines may need to be adjusted.  Check and record your blood pressure as often as told by your health care provider. Bring the records with you to clinic visits.  Consider therapy or joining a support group. This may help with any stress, fear, or anxiety.  Keep all follow-up visits as told by your health care provider. This is important. Get help right away if:  You gain weight quickly.  You have severe chest pain, especially if the pain is crushing or  pressure-like and spreads to the arms, back, neck, or jaw.  You have more swelling in your hands, feet, ankles, or abdomen.  You have nausea.  You have unusual sweating or your skin turns blue or pale.  Your shortness of breath gets worse.  You have dizziness, blurred vision, a headache, or unsteadiness.  Your blood pressure is higher than 180/120.  You cough up bloody mucus (sputum).  You cannot sleep because it is hard to breathe.  You feel a racing heart beat (palpitations).  You have anxiety or a feeling that you cannot get enough air. These symptoms may represent a serious problem that is an emergency. Do not wait to see if the symptoms will go away. Get medical help right away. Call your local emergency services (911 in the [place] Do not drive yourself to the hospital. Summary  Pulmonary edema is a condition in which fluid collects in the air sacs of your lungs. If left untreated, it can lead to a medical emergency.  This condition is most commonly caused by heart failure. Other causes can include infections or injury to the lungs.  Take   over-the-counter and prescription medicines only as told by your health care provider. This information is not intended to replace advice given to you by your health care provider. Make sure you discuss any questions you have with your health care provider. Document Revised: 06/11/2017 Document Reviewed: 09/09/2016 [employer] Patient Education  2021 [employer]  

## 2020-09-14 NOTE — Care Management CC44 (Signed)
Condition Code 44 Documentation Completed  Patient Details  Name: [name] MRN: [id number] Date of Birth: September 16, 1955   Condition Code 44 given:  Yes Patient signature on Condition Code 44 notice:  Yes Documentation of 2 MD's agreement:  Yes Code 44 added to claim:  Yes    Carles Collet, RN 09/14/2020, 10:40 AM

## 2020-09-14 NOTE — Progress Notes (Signed)
Pt. Discharged in stable condition via car with son. AVS documentation was given and explained. All questions and concerns were addressed.

## 2020-09-14 NOTE — Progress Notes (Signed)
Pt was given meds at 2247 ,however, Nurse tech stated that a little after 8:30 pm the pt had asked her for some water. When she gave her the water she said before she could tell her to stop , the pt had a cup of meds with several pills in them. When I gave the medications the pts vitals were fine,& I had no awareness of the pills she took while in Nurse Techs presence. I don't know for certain what pills pt took. MD was notified and vitals are being check frequently.

## 2020-09-17 ENCOUNTER — Other Ambulatory Visit: Payer: Self-pay

## 2020-09-17 ENCOUNTER — Encounter (HOSPITAL_COMMUNITY): Payer: Self-pay | Admitting: Family Medicine

## 2020-09-17 ENCOUNTER — Emergency Department (HOSPITAL_COMMUNITY): Payer: Medicare Other

## 2020-09-17 ENCOUNTER — Inpatient Hospital Stay (HOSPITAL_COMMUNITY)
Admission: EM | Admit: 2020-09-17 | Discharge: 2020-09-24 | DRG: 377 | Disposition: A | Discharge status: Home Health | Payer: Medicare Other | Attending: Internal Medicine | Admitting: Internal Medicine

## 2020-09-17 DIAGNOSIS — I132 Hypertensive heart and chronic kidney disease with heart failure and with stage 5 chronic kidney disease, or end stage renal disease: Secondary | ICD-10-CM | POA: Diagnosis present

## 2020-09-17 DIAGNOSIS — Z7982 Long term (current) use of aspirin: Secondary | ICD-10-CM

## 2020-09-17 DIAGNOSIS — E1100 Type 2 diabetes mellitus with hyperosmolarity without nonketotic hyperglycemic-hyperosmolar coma (NKHHC): Secondary | ICD-10-CM | POA: Diagnosis present

## 2020-09-17 DIAGNOSIS — I5043 Acute on chronic combined systolic (congestive) and diastolic (congestive) heart failure: Secondary | ICD-10-CM | POA: Diagnosis present

## 2020-09-17 DIAGNOSIS — Z91013 Allergy to seafood: Secondary | ICD-10-CM

## 2020-09-17 DIAGNOSIS — Z20822 Contact with and (suspected) exposure to covid-19: Secondary | ICD-10-CM | POA: Diagnosis not present

## 2020-09-17 DIAGNOSIS — Z87891 Personal history of nicotine dependence: Secondary | ICD-10-CM

## 2020-09-17 DIAGNOSIS — N2581 Secondary hyperparathyroidism of renal origin: Secondary | ICD-10-CM | POA: Diagnosis not present

## 2020-09-17 DIAGNOSIS — J441 Chronic obstructive pulmonary disease with (acute) exacerbation: Secondary | ICD-10-CM | POA: Diagnosis present

## 2020-09-17 DIAGNOSIS — N186 End stage renal disease: Secondary | ICD-10-CM

## 2020-09-17 DIAGNOSIS — M1990 Unspecified osteoarthritis, unspecified site: Secondary | ICD-10-CM | POA: Diagnosis present

## 2020-09-17 DIAGNOSIS — E1165 Type 2 diabetes mellitus with hyperglycemia: Secondary | ICD-10-CM

## 2020-09-17 DIAGNOSIS — A6000 Herpesviral infection of urogenital system, unspecified: Secondary | ICD-10-CM

## 2020-09-17 DIAGNOSIS — Z955 Presence of coronary angioplasty implant and graft: Secondary | ICD-10-CM

## 2020-09-17 DIAGNOSIS — K219 Gastro-esophageal reflux disease without esophagitis: Secondary | ICD-10-CM | POA: Diagnosis present

## 2020-09-17 DIAGNOSIS — J45909 Unspecified asthma, uncomplicated: Secondary | ICD-10-CM

## 2020-09-17 DIAGNOSIS — E1140 Type 2 diabetes mellitus with diabetic neuropathy, unspecified: Secondary | ICD-10-CM | POA: Diagnosis present

## 2020-09-17 DIAGNOSIS — M109 Gout, unspecified: Secondary | ICD-10-CM | POA: Diagnosis present

## 2020-09-17 DIAGNOSIS — I350 Nonrheumatic aortic (valve) stenosis: Secondary | ICD-10-CM | POA: Diagnosis not present

## 2020-09-17 DIAGNOSIS — J449 Chronic obstructive pulmonary disease, unspecified: Secondary | ICD-10-CM | POA: Diagnosis present

## 2020-09-17 DIAGNOSIS — M1A39X0 Chronic gout due to renal impairment, multiple sites, without tophus (tophi): Secondary | ICD-10-CM

## 2020-09-17 DIAGNOSIS — K922 Gastrointestinal hemorrhage, unspecified: Secondary | ICD-10-CM | POA: Diagnosis present

## 2020-09-17 DIAGNOSIS — Z79899 Other long term (current) drug therapy: Secondary | ICD-10-CM

## 2020-09-17 DIAGNOSIS — Z833 Family history of diabetes mellitus: Secondary | ICD-10-CM

## 2020-09-17 DIAGNOSIS — E875 Hyperkalemia: Secondary | ICD-10-CM | POA: Diagnosis present

## 2020-09-17 DIAGNOSIS — I10 Essential (primary) hypertension: Secondary | ICD-10-CM | POA: Diagnosis not present

## 2020-09-17 DIAGNOSIS — Z933 Colostomy status: Secondary | ICD-10-CM

## 2020-09-17 DIAGNOSIS — D62 Acute posthemorrhagic anemia: Secondary | ICD-10-CM | POA: Diagnosis present

## 2020-09-17 DIAGNOSIS — Z8249 Family history of ischemic heart disease and other diseases of the circulatory system: Secondary | ICD-10-CM

## 2020-09-17 DIAGNOSIS — M79672 Pain in left foot: Secondary | ICD-10-CM | POA: Diagnosis not present

## 2020-09-17 DIAGNOSIS — E1122 Type 2 diabetes mellitus with diabetic chronic kidney disease: Secondary | ICD-10-CM | POA: Diagnosis present

## 2020-09-17 DIAGNOSIS — G8929 Other chronic pain: Secondary | ICD-10-CM

## 2020-09-17 DIAGNOSIS — Z888 Allergy status to other drugs, medicaments and biological substances status: Secondary | ICD-10-CM

## 2020-09-17 DIAGNOSIS — K264 Chronic or unspecified duodenal ulcer with hemorrhage: Principal | ICD-10-CM | POA: Diagnosis present

## 2020-09-17 DIAGNOSIS — R0602 Shortness of breath: Secondary | ICD-10-CM | POA: Diagnosis present

## 2020-09-17 DIAGNOSIS — G4733 Obstructive sleep apnea (adult) (pediatric): Secondary | ICD-10-CM | POA: Diagnosis present

## 2020-09-17 DIAGNOSIS — R739 Hyperglycemia, unspecified: Secondary | ICD-10-CM

## 2020-09-17 DIAGNOSIS — Z992 Dependence on renal dialysis: Secondary | ICD-10-CM

## 2020-09-17 DIAGNOSIS — I252 Old myocardial infarction: Secondary | ICD-10-CM

## 2020-09-17 DIAGNOSIS — Z6841 Body Mass Index (BMI) 40.0 and over, adult: Secondary | ICD-10-CM | POA: Diagnosis not present

## 2020-09-17 DIAGNOSIS — I5033 Acute on chronic diastolic (congestive) heart failure: Secondary | ICD-10-CM

## 2020-09-17 DIAGNOSIS — I2510 Atherosclerotic heart disease of native coronary artery without angina pectoris: Secondary | ICD-10-CM | POA: Diagnosis present

## 2020-09-17 DIAGNOSIS — E785 Hyperlipidemia, unspecified: Secondary | ICD-10-CM | POA: Diagnosis present

## 2020-09-17 DIAGNOSIS — IMO0002 Reserved for concepts with insufficient information to code with codable children: Secondary | ICD-10-CM

## 2020-09-17 DIAGNOSIS — D631 Anemia in chronic kidney disease: Secondary | ICD-10-CM

## 2020-09-17 DIAGNOSIS — J9601 Acute respiratory failure with hypoxia: Secondary | ICD-10-CM | POA: Diagnosis present

## 2020-09-17 DIAGNOSIS — Z9109 Other allergy status, other than to drugs and biological substances: Secondary | ICD-10-CM

## 2020-09-17 DIAGNOSIS — R9431 Abnormal electrocardiogram [ECG] [EKG]: Secondary | ICD-10-CM | POA: Diagnosis present

## 2020-09-17 DIAGNOSIS — Z800 Family history of malignant neoplasm of digestive organs: Secondary | ICD-10-CM

## 2020-09-17 DIAGNOSIS — Z7951 Long term (current) use of inhaled steroids: Secondary | ICD-10-CM

## 2020-09-17 DIAGNOSIS — Z794 Long term (current) use of insulin: Secondary | ICD-10-CM

## 2020-09-17 DIAGNOSIS — I25110 Atherosclerotic heart disease of native coronary artery with unstable angina pectoris: Secondary | ICD-10-CM

## 2020-09-17 LAB — COMPREHENSIVE METABOLIC PANEL
ALT: 36 U/L (ref 0–44)
AST: 46 U/L — ABNORMAL HIGH (ref 15–41)
Albumin: 2.5 g/dL — ABNORMAL LOW (ref 3.5–5.0)
Alkaline Phosphatase: 326 U/L — ABNORMAL HIGH (ref 38–126)
Anion gap: 11 (ref 5–15)
BUN: 55 mg/dL — ABNORMAL HIGH (ref 8–23)
CO2: 25 mmol/L (ref 22–32)
Calcium: 7.6 mg/dL — ABNORMAL LOW (ref 8.9–10.3)
Chloride: 92 mmol/L — ABNORMAL LOW (ref 98–111)
Creatinine, Ser: 4.51 mg/dL — ABNORMAL HIGH (ref 0.44–1.00)
GFR, Estimated: 10 mL/min — ABNORMAL LOW (ref >60)
Glucose, Bld: 693 mg/dL (ref 70–99)
Potassium: 5.3 mmol/L — ABNORMAL HIGH (ref 3.5–5.1)
Sodium: 128 mmol/L — ABNORMAL LOW (ref 135–145)
Total Bilirubin: 0.7 mg/dL (ref 0.3–1.2)
Total Protein: 5.6 g/dL — ABNORMAL LOW (ref 6.5–8.1)

## 2020-09-17 LAB — PROTIME-INR
INR: 1.1 (ref 0.8–1.2)
Prothrombin Time: 13.3 seconds (ref 11.4–15.2)

## 2020-09-17 LAB — CBC WITH DIFFERENTIAL/PLATELET
Abs Immature Granulocytes: 0.12 10*3/uL — ABNORMAL HIGH (ref 0.00–0.07)
Basophils Absolute: 0 10*3/uL (ref 0.0–0.1)
Basophils Relative: 0 %
Eosinophils Absolute: 0.1 10*3/uL (ref 0.0–0.5)
Eosinophils Relative: 2 %
HCT: 20.1 % — ABNORMAL LOW (ref 36.0–46.0)
Hemoglobin: 6.4 g/dL — CL (ref 12.0–15.0)
Immature Granulocytes: 2 %
Lymphocytes Relative: 34 %
Lymphs Abs: 2.8 10*3/uL (ref 0.7–4.0)
MCH: 31.8 pg (ref 26.0–34.0)
MCHC: 31.8 g/dL (ref 30.0–36.0)
MCV: 100 fL (ref 80.0–100.0)
Monocytes Absolute: 0.7 10*3/uL (ref 0.1–1.0)
Monocytes Relative: 8 %
Neutro Abs: 4.4 10*3/uL (ref 1.7–7.7)
Neutrophils Relative %: 54 %
Platelets: 237 10*3/uL (ref 150–400)
RBC: 2.01 MIL/uL — ABNORMAL LOW (ref 3.87–5.11)
RDW: 14.6 % (ref 11.5–15.5)
WBC: 8.1 10*3/uL (ref 4.0–10.5)
nRBC: 0 % (ref 0.0–0.2)

## 2020-09-17 LAB — RESP PANEL BY RT-PCR (FLU A&B, COVID) ARPGX2
Influenza A by PCR: NEGATIVE
Influenza B by PCR: NEGATIVE
SARS Coronavirus 2 by RT PCR: NEGATIVE

## 2020-09-17 LAB — LACTIC ACID, PLASMA: Lactic Acid, Venous: 2.6 mmol/L (ref 0.5–1.9)

## 2020-09-17 LAB — LIPASE, BLOOD: Lipase: 60 U/L — ABNORMAL HIGH (ref 11–51)

## 2020-09-17 LAB — CBG MONITORING, ED: Glucose-Capillary: >600 mg/dL (ref 70–99)

## 2020-09-17 LAB — PREPARE RBC (CROSSMATCH)

## 2020-09-17 LAB — POC OCCULT BLOOD, ED: Fecal Occult Bld: POSITIVE — AB

## 2020-09-17 MED ORDER — LACTATED RINGERS IV SOLN: INTRAVENOUS | Status: DC

## 2020-09-17 MED ORDER — SODIUM CHLORIDE 0.9 % IV SOLN
80.0000 mg | Freq: Once | INTRAVENOUS | Status: AC
  Administered 2020-09-17: 80 mg via INTRAVENOUS
  Filled 2020-09-17: qty 80

## 2020-09-17 MED ORDER — SODIUM CHLORIDE 0.9 % IV SOLN
10.0000 mL/h | Freq: Once | INTRAVENOUS | Status: AC
  Administered 2020-09-17: 10 mL/h via INTRAVENOUS

## 2020-09-17 MED ORDER — INSULIN ASPART 100 UNIT/ML ~~LOC~~ SOLN
8.0000 [IU] | Freq: Once | SUBCUTANEOUS | Status: AC
  Administered 2020-09-17: 8 [IU] via INTRAVENOUS

## 2020-09-17 MED ORDER — ALBUTEROL SULFATE HFA 108 (90 BASE) MCG/ACT IN AERS
2.0000 | INHALATION_SPRAY | Freq: Four times a day (QID) | RESPIRATORY_TRACT | Status: DC | PRN
  Administered 2020-09-19: 2 via RESPIRATORY_TRACT
  Filled 2020-09-17: qty 6.7

## 2020-09-17 MED ORDER — MOMETASONE FURO-FORMOTEROL FUM 200-5 MCG/ACT IN AERO
2.0000 | INHALATION_SPRAY | Freq: Two times a day (BID) | RESPIRATORY_TRACT | Status: DC
  Administered 2020-09-19 – 2020-09-23 (×11): 2 via RESPIRATORY_TRACT
  Filled 2020-09-17 (×2): qty 8.8

## 2020-09-17 MED ORDER — DEXTROSE 50 % IV SOLN: 0.0000 mL | INTRAVENOUS | Status: DC | PRN

## 2020-09-17 MED ORDER — INSULIN REGULAR(HUMAN) IN NACL 100-0.9 UT/100ML-% IV SOLN
INTRAVENOUS | Status: AC
  Administered 2020-09-18: 11.5 [IU]/h via INTRAVENOUS
  Administered 2020-09-18: 23 [IU]/h via INTRAVENOUS
  Filled 2020-09-17 (×2): qty 100

## 2020-09-17 MED ORDER — DEXTROSE IN LACTATED RINGERS 5 % IV SOLN: INTRAVENOUS | Status: DC

## 2020-09-17 MED ORDER — SODIUM CHLORIDE 0.9 % IV SOLN
8.0000 mg/h | INTRAVENOUS | Status: AC
  Administered 2020-09-18 – 2020-09-20 (×5): 8 mg/h via INTRAVENOUS
  Filled 2020-09-17 (×9): qty 80

## 2020-09-17 MED ORDER — PANTOPRAZOLE SODIUM 40 MG IV SOLR
40.0000 mg | Freq: Two times a day (BID) | INTRAVENOUS | Status: DC
  Administered 2020-09-21 – 2020-09-23 (×5): 40 mg via INTRAVENOUS
  Filled 2020-09-17 (×5): qty 40

## 2020-09-17 NOTE — H&P (Signed)
History and Physical    [name] LOV:[id number] DOB: [birth year] DOA: 09/17/2020  PCP: [name], PA-C   Patient coming from: Home   Chief Complaint: Fatigue, nausea, dark tarry colostomy output   HPI: [name] is a 65 y.o. female with medical history significant for coronary artery disease, insulin-dependent diabetes mellitus, chronic anemia, hepatitis C, depression, anxiety, ESRD on hemodialysis, and necrotizing fasciitis status post multiple debridements and colostomy in January, now presenting with fatigue, nausea, and dark tarry colostomy output.  Patient's daughter provided additional history by phone.  Patient lives with her daughter and they noted very dark and tarry output from the colostomy beginning 2 days ago and increasing.  Patient has also developed nausea and fatigue.  She has not been eating or drinking much over the past couple days due to the nausea.  She was at dialysis today and reportedly had approximately 30 minutes remaining in her session when it was stopped due to severe nausea.  Patient reports that she did not actually vomit.  In addition to fatigue and nausea, she has had increased shortness of breath but denies any cough, wheezing, or chest pain.  Patient's daughter reports that the groin wounds have healed well.  Patient denies any fevers or chills.  She takes aspirin 81 mg daily, uses omeprazole only as needed and has not taken it in a few days, and denies any over-the-counter analgesic use.  ED Course: Upon arrival to the ED, patient is found to be afebrile, saturating 100% on room air, normal heart rate and respirations, and systolic blood pressure 884.  EKG features sinus rhythm with QTC 543 ms.  Chest x-[place] notable for minimal vascular congestion.  Chemistry panel features a glucose of 693, potassium 5.3, albumin 2.5, and BUN 55.  CBC notable for hemoglobin of 6.4, down from 9.4 three days ago.  Fecal occult blood testing is positive.  COVID-19  screening test negative.  Patient was given 8 units of NovoLog, 80 mg IV Protonix, and 1 unit of RBC was ordered in the ED.  Review of Systems:  All other systems reviewed and apart from HPI, are negative.  Past Medical History:  Diagnosis Date  . Anemia   . Aortic stenosis    Echo 8/18: mean 13, peak 28, LVOT/AV mean velocity 0.51  . Arthritis   . Asthma    As a child   . Bronchitis   . CAD (coronary artery disease)    a. 09/2016: 50% Ost 1st Mrg stenosis, 50% 2nd Mrg stenosis, 20% Mid-Cx, 95% Prox LAD, 40% mid-LAD, and 10% dist-LAD stenosis. Staged PCI with DES to Prox-LAD.   [name] Chronic combined systolic and diastolic CHF (congestive heart failure) ([place]) 2011   echo 2/18: EF 55-60, normal wall motion, grade 2 diastolic dysfunction, trivial AI // echo 3/18: Septal and apical HK, EF 45-50, normal wall motion, trivial AI, mild LAE, PASP 38 // echo 8/18: EF 60-65, normal wall motion, grade 1 diastolic dysfunction, calcified aortic valve leaflets, mild aortic stenosis (mean 13, peak 28, LVOT/AV mean velocity 0.51), mild AI, moderate MAC, mild LAE, trivial TR   . Chronic kidney disease    STAGE 4  . Chronic kidney disease on chronic dialysis (HCC)    t, th, sat  . Complication of anesthesia   . Depression   . Diabetes mellitus Dx 1989  . Elevated lipids   . GERD (gastroesophageal reflux disease)   . Gout   . Heart murmur    asymptomatic  .  Hepatitis C Dx 2013  . Hypertension Dx 1989  . Infected surgical wound    Lt arm  . Myocardial infarction ([place]) 07/2015  . Obesity   . Pancreatitis 2013  . Pneumonia   . Refusal of blood transfusions as patient is Jehovah's Witness   . Tendinitis   . Tremors of nervous system    LEFT HAND  . Ulcer 2010    Past Surgical History:  Procedure Laterality Date  . A/V FISTULAGRAM Left 04/11/2019   Procedure: A/V FISTULAGRAM;  Surgeon: Katha Cabal, MD;  Location: [place] [facility];  Service: Cardiovascular;  Laterality: Left;  . A/V  FISTULAGRAM Left 06/02/2019   Procedure: A/V FISTULAGRAM;  Surgeon: Katha Cabal, MD;  Location: [place] [facility];  Service: Cardiovascular;  Laterality: Left;  . APPLICATION OF WOUND VAC Left 08/14/2017   Procedure: APPLICATION OF WOUND VAC Exchange;  Surgeon: Robert Bellow, MD;  Location: [facility];  Service: General;  Laterality: Left;  . APPLICATION OF WOUND VAC Left 12/21/2018   Procedure: APPLICATION OF WOUND VAC;  Surgeon: Katha Cabal, MD;  Location: [facility];  Service: Vascular;  Laterality: Left;  . AV FISTULA PLACEMENT Left 08/19/2018   Procedure: ARTERIOVENOUS (AV) FISTULA CREATION ( BRACHIOBASILIC );  Surgeon: Katha Cabal, MD;  Location: [facility];  Service: Vascular;  Laterality: Left;  . BASCILIC VEIN TRANSPOSITION Left 11/18/2018   Procedure: BASCILIC VEIN TRANSPOSITION;  Surgeon: Katha Cabal, MD;  Location: [facility];  Service: Vascular;  Laterality: Left;  . CHOLECYSTECTOMY    . COLONOSCOPY WITH PROPOFOL N/A 02/03/2018   Procedure: COLONOSCOPY WITH PROPOFOL;  Surgeon: Lin Landsman, MD;  Location: [facility];  Service: Gastroenterology;  Laterality: N/A;  . CORONARY ANGIOPLASTY  07/2015   STENT  . CORONARY STENT INTERVENTION N/A 09/18/2016   Procedure: Coronary Stent Intervention;  Surgeon: Troy Sine, MD;  Location: [place] [facility];  Service: Cardiovascular;  Laterality: N/A;  . DIALYSIS/PERMA CATHETER INSERTION N/A 05/10/2018   Procedure: DIALYSIS/PERMA CATHETER INSERTION;  Surgeon: Katha Cabal, MD;  Location: [place] [facility];  Service: Cardiovascular;  Laterality: N/A;  . DRESSING CHANGE UNDER ANESTHESIA Left 08/15/2017   Procedure: exploration of wound for bleeding;  Surgeon: Robert Bellow, MD;  Location: [facility];  Service: General;  Laterality: Left;  . ESOPHAGOGASTRODUODENOSCOPY (EGD) WITH PROPOFOL N/A 02/03/2018   Procedure: ESOPHAGOGASTRODUODENOSCOPY (EGD) WITH PROPOFOL;  Surgeon: Lin Landsman, MD;  Location:  [facility] ENDOSCOPY;  Service: Gastroenterology;  Laterality: N/A;  . EYE SURGERY  11/17/2018  . INCISION AND DRAINAGE ABSCESS Left 08/12/2017   Procedure: INCISION AND DRAINAGE ABSCESS;  Surgeon: Robert Bellow, MD;  Location: [facility];  Service: General;  Laterality: Left;  . KNEE ARTHROSCOPY    . LEFT HEART CATH N/A 09/18/2016   Procedure: Left Heart Cath;  Surgeon: Troy Sine, MD;  Location: [place] [facility];  Service: Cardiovascular;  Laterality: N/A;  . LEFT HEART CATH AND CORONARY ANGIOGRAPHY N/A 09/16/2016   Procedure: Left Heart Cath and Coronary Angiography;  Surgeon: Burnell Blanks, MD;  Location: [place] [facility];  Service: Cardiovascular;  Laterality: N/A;  . LEFT HEART CATH AND CORONARY ANGIOGRAPHY N/A 04/29/2017   Procedure: LEFT HEART CATH AND CORONARY ANGIOGRAPHY;  Surgeon: Nelva Bush, MD;  Location: [place] [facility];  Service: Cardiovascular;  Laterality: N/A;  . LOWER EXTREMITY ANGIOGRAPHY Right 03/08/2018   Procedure: LOWER EXTREMITY ANGIOGRAPHY;  Surgeon: Katha Cabal, MD;  Location: [facility]  INV[facility];  Service: Cardiovascular;  Laterality: Right;  . TUBAL LIGATION    . TUBAL LIGATION    . UPPER EXTREMITY ANGIOGRAPHY Right 09/19/2019   Procedure: UPPER EXTREMITY ANGIOGRAPHY;  Surgeon: Katha Cabal, MD;  Location: [place] [facility];  Service: Cardiovascular;  Laterality: Right;  . WOUND DEBRIDEMENT Left 12/21/2018   Procedure: DEBRIDEMENT WOUND;  Surgeon: Katha Cabal, MD;  Location: [facility];  Service: Vascular;  Laterality: Left;  . WOUND DEBRIDEMENT Left 12/30/2018   Procedure: DEBRIDEMENT WOUND WITH VAC PLACEMENT (LEFT UPPER EXTREMITY);  Surgeon: Katha Cabal, MD;  Location: [facility];  Service: Vascular;  Laterality: Left;    Social History:   reports that she quit smoking about 39 years ago. Her smoking use included cigarettes. She has a 1.50 pack-year smoking history. She has never used smokeless tobacco. She reports current  alcohol use. She reports previous drug use. Drug: Marijuana.  Allergies  Allergen Reactions  . Shellfish Allergy Anaphylaxis and Swelling  . Diazepam Other (See Comments)    "felt like out of body experience"    Family History  Problem Relation Age of Onset  . Colon cancer Mother   . Heart attack Other   . Heart attack Maternal Grandmother   . Hypertension Sister   . Hypertension Brother   . Diabetes Paternal Grandmother   . Breast cancer Neg Hx      Prior to Admission medications   Medication Sig Start Date End Date Taking? Authorizing Provider  albuterol (VENTOLIN HFA) 108 (90 Base) MCG/ACT inhaler Inhale 2 puffs into the lungs every 4 (four) hours as needed for wheezing or shortness of breath. 06/28/20   [name], PA-C  allopurinol (ZYLOPRIM) 100 MG tablet TAKE 1 TABLET BY MOUTH ONCE DAILY. Patient taking differently: Take 100 mg by mouth daily. 12/08/19   [name], PA-C  aspirin EC 81 MG EC tablet Take 1 tablet (81 mg total) by mouth daily. 09/19/16   Strader, Fransisco Hertz, PA-C  atorvastatin (LIPITOR) 80 MG tablet TAKE 1 TABLET BY MOUTH ONCE DAILY AT 6PM Patient taking differently: Take 80 mg by mouth every evening. 07/08/20   [name], PA-C  AURYXIA 1 GM 210 MG(Fe) tablet Take 2 tablets (420 mg total) by mouth 3 (three) times daily with meals. 05/03/20   [name], PA-C  buPROPion (WELLBUTRIN SR) 150 MG 12 hr tablet Take 1 tablet (150 mg total) by mouth 2 (two) times daily. 05/03/20   [name], PA-C  carvedilol (COREG) 25 MG tablet TAKE (1) TABLET BY MOUTH TWICE A DAY WITH MEALS (BREAKFAST AND SUPPER) Patient taking differently: Take 25 mg by mouth 2 (two) times daily with a meal. 07/15/20   Burnette, Clearnce Sorrel, PA-C  Cinacalcet HCl (SENSIPAR PO) Take 30 mg by mouth every dialysis. 01/04/20 01/02/21  [provider]  Continuous Blood Gluc Receiver (FREESTYLE LIBRE 14 DAY READER) DEVI To check blood sugar ACHS  05/24/20   [name], PA-C  Continuous Blood Gluc Sensor (FREESTYLE LIBRE 14 DAY SENSOR) MISC To check blood sugar ACHS; change every 14 days 05/24/20   [name], PA-C  cyclobenzaprine (FLEXERIL) 5 MG tablet Take 1 tablet (5 mg total) by mouth 3 (three) times daily as needed for muscle spasms. Patient not taking: No sig reported 08/10/19   [name], PA-C  dicyclomine (BENTYL) 20 MG tablet TAKE 1 TABLET(20 MG) BY MOUTH FOUR TIMES DAILY BEFORE MEALS AND AT BEDTIME Patient taking  differently: Take 20 mg by mouth 4 (four) times daily -  before meals and at bedtime. 10/16/19   [name], PA-C  docusate sodium (COLACE) 100 MG capsule Take 1 capsule (100 mg total) by mouth 2 (two) times daily as needed for mild constipation. 09/20/17   Gouru, Illene Silver, MD  doxepin (SINEQUAN) 10 MG capsule TAKE 1 CAPSULE(10 MG) BY MOUTH FOUR TIMES DAILY AS NEEDED FOR ITCHING Patient taking differently: Take 10 mg by mouth 2 (two) times daily. 05/03/20   [name], PA-C  DULERA 200-5 MCG/ACT AERO Inhale 2 puffs into the lungs 2 (two) times daily as needed.  11/26/19   [provider]  fluticasone (FLONASE) 50 MCG/ACT nasal spray Place 2 sprays into both nostrils daily as needed for allergies or rhinitis. 09/28/17   [name], PA-C  fluticasone furoate-vilanterol (BREO ELLIPTA) 200-25 MCG/INH AEPB Inhale 1 puff into the lungs daily. 08/30/20   [name], PA-C  gentamicin cream (GARAMYCIN) 0.1 % Apply 1 application topically 2 (two) times daily. Patient not taking: No sig reported 11/20/19   Edrick Kins, DPM  hydrALAZINE (APRESOLINE) 100 MG tablet TAKE (1) TABLET BY MOUTH THREE TIMES DAILY Patient taking differently: Take 100 mg by mouth 3 (three) times daily. 07/15/20   [name], PA-C  hydrOXYzine (ATARAX/VISTARIL) 25 MG tablet Take 1 tablet (25 mg total) by mouth 4 (four) times daily. 05/03/20   [name], PA-C  insulin  detemir (LEVEMIR FLEXTOUCH) 100 UNIT/ML FlexPen Inject 60 Units into the skin 2 (two) times daily. Patient not taking: No sig reported 05/24/20   [name], PA-C  insulin glargine (LANTUS) 100 unit/mL SOPN Inject 25 Units into the skin 2 (two) times daily.    [provider]  insulin lispro (HUMALOG KWIKPEN) 100 UNIT/ML KwikPen Inject 25 Units into the skin with breakfast, with lunch, and with evening meal. Patient taking differently: Inject 7 Units into the skin 2 (two) times daily. 05/14/20   [name], PA-C  lidocaine-prilocaine (EMLA) cream Apply 1 application topically as needed. Patient taking differently: Apply 1 application topically as needed (dialysis days). 03/27/19   Schnier, Dolores Lory, MD  loperamide (IMODIUM A-D) 2 MG tablet Take 1 tablet (2 mg total) by mouth as needed (diarrhea). 10/21/19 10/19/20  [provider]  loratadine (CLARITIN) 10 MG tablet TAKE 1 TABLET BY MOUTH ONCE DAILY. Patient taking differently: Take 10 mg by mouth daily as needed for allergies. 07/15/20   [name], PA-C  mometasone (ELOCON) 0.1 % cream APPLY AS DIRECTED TO AFFECTED AREA ONCE DAILY. Patient taking differently: Apply 1 application topically daily as needed. 12/08/18   [name], PA-C  montelukast (SINGULAIR) 10 MG tablet TAKE ONE TABLET BY MOUTH AT BEDTIME. Patient taking differently: Take 10 mg by mouth at bedtime as needed (allergies). 07/15/20   [name], PA-C  nitroGLYCERIN (NITROSTAT) 0.4 MG SL tablet Place 1 tablet (0.4 mg total) under the tongue every 5 (five) minutes x 3 doses as needed for chest pain. 05/03/20   [name], PA-C  Nutritional Supplements (FEEDING SUPPLEMENT, NEPRO CARB STEADY,) LIQD Take 237 mLs by mouth daily. Patient not taking: No sig reported 02/01/20   Loletha Grayer, MD  nystatin (MYCOSTATIN) 100000 UNIT/ML suspension Take 5 mLs (500,000 Units total) by mouth 4 (four) times daily. Patient not  taking: No sig reported 06/28/20   [name], PA-C  nystatin cream (MYCOSTATIN) Apply 1 application topically 2 (two)  times daily. Patient taking differently: Apply 1 application topically daily as needed for dry skin. 08/10/19   [name], PA-C  omeprazole (PRILOSEC) 20 MG capsule TAKE (1) CAPSULE BY MOUTH ONCE DAILY. Patient taking differently: Take 20 mg by mouth daily as needed (refkux). 07/15/20   [name], PA-C  oxyCODONE (ROXICODONE) 5 MG immediate release tablet Take 1 tablet (5 mg total) by mouth every 12 (twelve) hours as needed for severe pain. 08/30/20   [name], PA-C  pregabalin (LYRICA) 150 MG capsule TAKE 1 CAPSULE(150 MG) BY MOUTH TWICE DAILY Patient taking differently: Take 150 mg by mouth 2 (two) times daily. 02/29/20   Burnette, Jennifer M, PA-C  SENNA-PLUS 8.6-50 MG tablet TAKE ONE TABLET BY MOUTH AT BEDTIME. Patient not taking: No sig reported 07/15/20   [name], PA-C  sevelamer carbonate (RENVELA) 800 MG tablet Take 800 mg by mouth 3 (three) times daily. 08/21/20 08/21/21  [provider]  silver sulfADIAZINE (SILVADENE) 1 % cream Apply 1 application topically daily. Apply to right first toe Patient taking differently: Apply 1 application topically daily as needed (pain). 02/21/19   Kris Hartmann, NP  torsemide (DEMADEX) 20 MG tablet TAKE (2) TABLETS BY MOUTH TWICE DAILY. Patient taking differently: Take 40 mg by mouth 2 (two) times daily. 05/21/20   [name], PA-C  triamcinolone cream (KENALOG) 0.1 % APPLY TO AFFECTED AREA TWICE DAILY Patient taking differently: Apply 1 application topically 2 (two) times daily as needed (rash). 06/17/18   [name], PA-C  valACYclovir (VALTREX) 500 MG tablet TAKE (1) TABLET BY MOUTH EVERY OTHER DAY. Patient taking differently: Take 500 mg by mouth every other day. 09/06/20   [name], PA-C  VICTOZA 18 MG/3ML SOPN Start with 0.58m daily and increase by  0.670mper week until max dose of 1.8 mg dose achieved. Patient not taking: No sig reported 03/25/20   BuMar DaringPA-C  Vitamin D, Ergocalciferol, (DRISDOL) 1.25 MG (50000 UNIT) CAPS capsule TAKE 1 CAPSULE BY MOUTH ONCE A MONTH Patient taking differently: Take 50,000 Units by mouth every 30 (thirty) days. 07/17/20   BuMar DaringPA-C  gabapentin (NEURONTIN) 100 MG capsule Take 1 capsule (100 mg total) by mouth 3 (three) times daily. 08/19/17 02/03/19  PaFritzi MandesMD    Physical Exam: Vitals:   09/17/20 2030 09/17/20 2115 09/17/20 2145 09/17/20 2215  BP: (!) 127/45 125/60 (!) 122/54 (!) 138/53  Pulse: 89 90 93 92  Resp: 13 (!) _0 Temp:      TempSrc:      SpO2: 100% 100% 100% 100%  Weight:      Height:        Constitutional: NAD, calm  Eyes: PERTLA, lids and conjunctivae normal ENMT: Mucous membranes are moist. Posterior pharynx clear of any exudate or lesions.   Neck: normal, supple, no masses, no thyromegaly Respiratory: no wheezing, no crackles. No accessory muscle use.  Cardiovascular: S1 & S2 heard, regular rate and rhythm. Mild lower leg swelling.   Abdomen: No distension, soft, colostomy with dark output. Bowel sounds active.  Musculoskeletal: no clubbing / cyanosis. No joint deformity upper and lower extremities.   Skin: no significant rashes, lesions, ulcers. Warm, dry, well-perfused. Neurologic: CN 2-12 grossly intact. Sensation intact. Moving all extremities.  Psychiatric: Alert and oriented to person, place, and situation. Pleasant and cooperative.    Labs and Imaging on Admission: I have personally reviewed following labs and imaging studies  CBC: Recent Labs  Lab 09/13/20 0445 09/13/20 0512 09/13/20 0537 09/14/20 0206 09/17/20 2020  WBC 4.4  --   --  5.3 8.1  NEUTROABS 2.7  --   --   --  4.4  HGB 9.1* 10.2* 10.5* 9.4* 6.4*  HCT 29.4* 30.0* 31.0* 28.4* 20.1*  MCV 101.7*  --   --  97.9 100.0  PLT 136*  --   --  141* 037   Basic  Metabolic Panel: Recent Labs  Lab 09/13/20 0556 09/13/20 0944 09/13/20 1822 09/14/20 0206 09/17/20 2020  NA 130* 135 136 130* 128*  K 4.3 4.2 4.1 4.2 5.3*  CL 93* 95* 95* 93* 92*  CO2 _0 GLUCOSE 604* 231* 220* 392* 693*  BUN 29* 27* 14 17 55*  CREATININE 5.65* 5.72* 3.65* 4.07* 4.51*  CALCIUM 8.1* 8.3* 9.0 8.4* 7.6*  PHOS  --   --   --  3.8  --    GFR: Estimated Creatinine Clearance: 18.1 mL/min (A) (by C-G formula based on SCr of 4.51 mg/dL (H)). Liver Function Tests: Recent Labs  Lab 09/13/20 0445 09/14/20 0206 09/17/20 2020  AST 42*  --  46*  ALT 39  --  36  ALKPHOS 359*  --  326*  BILITOT 0.6  --  0.7  PROT 6.5  --  5.6*  ALBUMIN 2.9* 2.7* 2.5*   Recent Labs  Lab 09/17/20 2020  LIPASE 60*   No results for input(s): AMMONIA in the last 168 hours. Coagulation Profile: Recent Labs  Lab 09/17/20 2020  INR 1.1   Cardiac Enzymes: No results for input(s): CKTOTAL, CKMB, CKMBINDEX, TROPONINI in the last 168 hours. BNP (last 3 results) No results for input(s): PROBNP in the last 8760 hours. HbA1C: No results for input(s): HGBA1C in the last 72 hours. CBG: Recent Labs  Lab 09/13/20 1119 09/13/20 1801 09/13/20 2147 09/14/20 1140 09/17/20 2040  GLUCAP 239* 209* 358* 317* >600*   Lipid Profile: No results for input(s): CHOL, HDL, LDLCALC, TRIG, CHOLHDL, LDLDIRECT in the last 72 hours. Thyroid Function Tests: No results for input(s): TSH, T4TOTAL, FREET4, T3FREE, THYROIDAB in the last 72 hours. Anemia Panel: No results for input(s): VITAMINB12, FOLATE, FERRITIN, TIBC, IRON, RETICCTPCT in the last 72 hours. Urine analysis:    Component Value Date/Time   COLORURINE AMBER (A) 12/13/2019 1258   APPEARANCEUR CLOUDY (A) 12/13/2019 1258   LABSPEC 1.017 12/13/2019 1258   PHURINE 5.0 12/13/2019 1258   GLUCOSEU NEGATIVE 12/13/2019 1258   HGBUR NEGATIVE 12/13/2019 1258   BILIRUBINUR NEGATIVE 12/13/2019 1258   BILIRUBINUR neg 12/17/2016 1538    KETONESUR NEGATIVE 12/13/2019 1258   PROTEINUR 30 (A) 12/13/2019 1258   UROBILINOGEN 0.2 12/17/2016 1538   UROBILINOGEN 0.2 05/29/2010 2100   NITRITE NEGATIVE 12/13/2019 1258   LEUKOCYTESUR LARGE (A) 12/13/2019 1258   Sepsis Labs: _1 (procalcitonin:4,lacticidven:4) ) Recent Results (from the past 240 hour(s))  Resp Panel by RT-PCR (Flu A&B, Covid) Nasopharyngeal Swab     Status: None   Collection Time: 09/13/20  4:45 AM   Specimen: Nasopharyngeal Swab; Nasopharyngeal(NP) swabs in vial transport medium  Result Value Ref Range Status   SARS Coronavirus 2 by RT PCR NEGATIVE NEGATIVE Final    Comment: (NOTE) SARS-CoV-2 target nucleic acids are NOT DETECTED.  The SARS-CoV-2 RNA is generally detectable in upper respiratory specimens during the acute phase of infection. The lowest concentration of SARS-CoV-2 viral copies this assay can detect is 138 copies/mL. A negative result does not preclude  SARS-Cov-2 infection and should not be used as the sole basis for treatment or other patient management decisions. A negative result may occur with  improper specimen collection/handling, submission of specimen other than nasopharyngeal swab, presence of viral mutation(s) within the areas targeted by this assay, and inadequate number of viral copies(<138 copies/mL). A negative result must be combined with clinical observations, patient history, and epidemiological information. The expected result is Negative.  Fact Sheet for Patients:  EntrepreneurPulse.com.au  Fact Sheet for Healthcare Providers:  IncredibleEmployment.be  This test is no t yet approved or cleared by the [place] FDA and  has been authorized for detection and/or diagnosis of SARS-CoV-2 by FDA under an Emergency Use Authorization (EUA). This EUA will remain  in effect (meaning this test can be used) for the duration of the COVID-19 declaration under Section 564(b)(1) of the Act,  21 U.S.C.section 360bbb-3(b)(1), unless the authorization is terminated  or revoked sooner.       Influenza A by PCR NEGATIVE NEGATIVE Final   Influenza B by PCR NEGATIVE NEGATIVE Final    Comment: (NOTE) The Xpert Xpress SARS-CoV-2/FLU/RSV plus assay is intended as an aid in the diagnosis of influenza from Nasopharyngeal swab specimens and should not be used as a sole basis for treatment. Nasal washings and aspirates are unacceptable for Xpert Xpress SARS-CoV-2/FLU/RSV testing.  Fact Sheet for Patients: EntrepreneurPulse.com.au  Fact Sheet for Healthcare Providers: IncredibleEmployment.be  This test is not yet approved or cleared by the [place] FDA and has been authorized for detection and/or diagnosis of SARS-CoV-2 by FDA under an Emergency Use Authorization (EUA). This EUA will remain in effect (meaning this test can be used) for the duration of the COVID-19 declaration under Section 564(b)(1) of the Act, 21 U.S.C. section 360bbb-3(b)(1), unless the authorization is terminated or revoked.  Performed at [facility], [place] [address] [place], [place] [address]   Resp Panel by RT-PCR (Flu A&B, Covid) Nasopharyngeal Swab     Status: None   Collection Time: 09/17/20  8:44 PM   Specimen: Nasopharyngeal Swab; Nasopharyngeal(NP) swabs in vial transport medium  Result Value Ref Range Status   SARS Coronavirus 2 by RT PCR NEGATIVE NEGATIVE Final    Comment: (NOTE) SARS-CoV-2 target nucleic acids are NOT DETECTED.  The SARS-CoV-2 RNA is generally detectable in upper respiratory specimens during the acute phase of infection. The lowest concentration of SARS-CoV-2 viral copies this assay can detect is 138 copies/mL. A negative result does not preclude SARS-Cov-2 infection and should not be used as the sole basis for treatment or other patient management decisions. A negative result may occur with  improper specimen  collection/handling, submission of specimen other than nasopharyngeal swab, presence of viral mutation(s) within the areas targeted by this assay, and inadequate number of viral copies(<138 copies/mL). A negative result must be combined with clinical observations, patient history, and epidemiological information. The expected result is Negative.  Fact Sheet for Patients:  EntrepreneurPulse.com.au  Fact Sheet for Healthcare Providers:  IncredibleEmployment.be  This test is no t yet approved or cleared by the [place] FDA and  has been authorized for detection and/or diagnosis of SARS-CoV-2 by FDA under an Emergency Use Authorization (EUA). This EUA will remain  in effect (meaning this test can be used) for the duration of the COVID-19 declaration under Section 564(b)(1) of the Act, 21 U.S.C.section 360bbb-3(b)(1), unless the authorization is terminated  or revoked sooner.       Influenza A by PCR NEGATIVE NEGATIVE Final  Influenza B by PCR NEGATIVE NEGATIVE Final    Comment: (NOTE) The Xpert Xpress SARS-CoV-2/FLU/RSV plus assay is intended as an aid in the diagnosis of influenza from Nasopharyngeal swab specimens and should not be used as a sole basis for treatment. Nasal washings and aspirates are unacceptable for Xpert Xpress SARS-CoV-2/FLU/RSV testing.  Fact Sheet for Patients: EntrepreneurPulse.com.au  Fact Sheet for Healthcare Providers: IncredibleEmployment.be  This test is not yet approved or cleared by the [place] FDA and has been authorized for detection and/or diagnosis of SARS-CoV-2 by FDA under an Emergency Use Authorization (EUA). This EUA will remain in effect (meaning this test can be used) for the duration of the COVID-19 declaration under Section 564(b)(1) of the Act, 21 U.S.C. section 360bbb-3(b)(1), unless the authorization is terminated or revoked.  Performed at [facility], [place] [address], [place], [place] [address]      Radiological Exams on Admission: DG Chest Port 1 View  Result Date: 09/17/2020 CLINICAL DATA:  Weakness EXAM: PORTABLE CHEST 1 VIEW COMPARISON:  Radiograph 09/13/2020 FINDINGS: Stable cardiomediastinal contours. Some minimal vascular congestion without features of frank edema. No consolidation, pneumothorax, or effusion. Telemetry leads overlie the chest. No acute osseous or soft tissue abnormality. IMPRESSION: Minimal vascular congestion without features of frank edema. No other acute cardiopulmonary abnormality. Electronically Signed   By: Lovena Le M.D.   On: 09/17/2020 22:07    EKG: Independently reviewed. Sinus rhythm, QTc 543 ms.   Assessment/Plan   1. Upper GI bleeding; symptomatic anemia  - Presents with fatigue and 2 days of dark tarry colostomy output and is found to have Hgb of 6.4 (9.4 on 09/14/20) and BUN 55 despite HD today (17 on 09/14/20) - She was given 80 mg IV Protonix in ED and 1unit RBC was ordered for transfusion  - Continue IV PPI, check post-transfusion CBC, keep NPO, consult GI    2. Hyperosmolar hyperglycemic state  - Serum glucose is 693 on admission without acidosis; she is drowsy but conversant and oriented  - Start IV insulin infusion with frequent CBGs and serial chem panels    3. ESRD; hyperkalemia  - Presents from HD where session was stopped ~30 minutes early due to nausea and fatigue  - Potassium is 5.3 on admission, anticipate correction with insulin, will continue cardiac monitoring and repeat chem panel   4. COPD  - No cough or wheeze on admission  - Continue ICS/LABA and as-needed albuterol   5. CAD  - No anginal complaints, hold ASA in light of UGIB    6. Hypertension  - BP low-normal in ED and antihypertensives held on admission   7. Prolonged QT interval  - QTc 543 ms on admission  - Continue cardiac monitoring, minimize QT-prolonging medications    DVT prophylaxis: SCDs   Code Status: Full  Level of Care: Level of care: Progressive Family Communication: Daughter updated by phone  Disposition Plan:  Patient is from: home  Anticipated d/c is to: TBD Anticipated d/c date is: ~09/20/20 Patient currently: Pending blood transfusion, GI consultation, glycemic-control   Consults called: Message sent to GI with request for routine consultation  Admission status: Inpatient     Vianne Bulls, MD [facility]  09/17/2020, 11:02 PM

## 2020-09-17 NOTE — ED Notes (Signed)
Primary RN Daija made aware of critical labs received by this RN.

## 2020-09-17 NOTE — ED Notes (Signed)
New colostomy bag placed on patient. Patients circle per circle stencil is a 57.

## 2020-09-17 NOTE — ED Notes (Signed)
This RN and Messick MD in room when patient consented to blood products. This RN also spoke to patients daughter Carmin Alvidrez about patient receiving blood products. Patient Alert and oriented x4.

## 2020-09-17 NOTE — ED Triage Notes (Signed)
Pt bib [facility], pt started having dark tarry output in colostomy two days ago.  Since then output has worsened. Pt went to dialysis today but did not finish due to vomiting. Pt unaware of color of vomit. Pt A&0x3, disoriented to time.  [facility] VS:  130/70, HR=90s.

## 2020-09-17 NOTE — ED Notes (Signed)
Critical Hemoglobin 6.4 result given to [facility] MD

## 2020-09-17 NOTE — ED Notes (Addendum)
Provider aware of patients limited IV access. Unable to start insulin drip due to limited access and current blood infusion. Provider aware and agreed.

## 2020-09-17 NOTE — ED Notes (Signed)
This RN spoke to patients daughter Shayda Kalka and gave her an update per patients approval. Daughter states to call her any time with any updates regarding her mother. [phone]

## 2020-09-17 NOTE — ED Provider Notes (Signed)
[facility] EMERGENCY DEPARTMENT Provider Note   CSN: [id number] Arrival date & time: 09/17/20  2014     History Chief Complaint  Patient presents with  . Abdominal Pain    [name] is a 65 y.o. female.  65 year old female with prior medical history detailed below presents for evaluation.  Patient reports output into her colostomy is dark and tarry.  Patient reports onset of this dark tarry output 2 days prior.  Patient is on dialysis.  Her last dialysis session was this morning.  She denies shortness of breath.  She reports feeling weak and fatigued.  She denies prior history of GI bleed.  The history is provided by the patient, medical records and the EMS personnel.  Illness Location:  Dark tarry stool Severity:  Moderate Onset quality:  Gradual Duration:  2 days Timing:  Constant Progression:  Unchanged Chronicity:  New      Past Medical History:  Diagnosis Date  . Anemia   . Aortic stenosis    Echo 8/18: mean 13, peak 28, LVOT/AV mean velocity 0.51  . Arthritis   . Asthma    As a child   . Bronchitis   . CAD (coronary artery disease)    a. 09/2016: 50% Ost 1st Mrg stenosis, 50% 2nd Mrg stenosis, 20% Mid-Cx, 95% Prox LAD, 40% mid-LAD, and 10% dist-LAD stenosis. Staged PCI with DES to Prox-LAD.   [name] Chronic combined systolic and diastolic CHF (congestive heart failure) ([place]) 2011   echo 2/18: EF 55-60, normal wall motion, grade 2 diastolic dysfunction, trivial AI // echo 3/18: Septal and apical HK, EF 45-50, normal wall motion, trivial AI, mild LAE, PASP 38 // echo 8/18: EF 60-65, normal wall motion, grade 1 diastolic dysfunction, calcified aortic valve leaflets, mild aortic stenosis (mean 13, peak 28, LVOT/AV mean velocity 0.51), mild AI, moderate MAC, mild LAE, trivial TR   . Chronic kidney disease    STAGE 4  . Chronic kidney disease on chronic dialysis (HCC)    t, th, sat  . Complication of anesthesia   . Depression   . Diabetes  mellitus Dx 1989  . Elevated lipids   . GERD (gastroesophageal reflux disease)   . Gout   . Heart murmur    asymptomatic  . Hepatitis C Dx 2013  . Hypertension Dx 1989  . Infected surgical wound    Lt arm  . Myocardial infarction ([place]) 07/2015  . Obesity   . Pancreatitis 2013  . Pneumonia   . Refusal of blood transfusions as patient is Jehovah's Witness   . Tendinitis   . Tremors of nervous system    LEFT HAND  . Ulcer 2010    Patient Active Problem List   Diagnosis Date Noted  . Acute pulmonary edema ([place]) 09/14/2020  . Acute respiratory failure with hypoxia ([place]) 09/13/2020  . Allergy, unspecified, initial encounter 03/29/2020  . Anaphylactic shock, unspecified, initial encounter 03/29/2020  . Volume overload 01/30/2020  . Pain in left upper arm 01/18/2020  . Cellulitis 01/16/2020  . Anemia due to end stage renal disease ([place]) 01/16/2020  . Diabetic polyneuropathy associated with type 2 diabetes mellitus ([place]) 11/06/2019  . Nystagmus 11/06/2019  . Intermittent claudication ([place]) 11/06/2019  . Vertigo of central origin 11/06/2019  . Weakness   . Hypervolemia associated with renal insufficiency 10/31/2019  . BMI 45.0-49.9, adult ([place]) 10/18/2019  . Ascending aortic aneurysm ([place]) 03/27/2019  . Puncture wound without foreign body of left forearm, initial encounter 01/02/2019  .  Non-healing wound of upper extremity 12/28/2018  . Unspecified open wound of left upper arm, initial encounter 12/06/2018  . Complication from renal dialysis device 10/20/2018  . Iron deficiency anemia, unspecified 10/18/2018  . ESRD (end stage renal disease) on dialysis ([place]) 06/01/2018  . ARF (acute renal failure) ([place]) 05/09/2018  . Colon cancer screening   . LGSIL on Pap smear of cervix 01/27/2018  . Gout 12/25/2017  . AKI (acute kidney injury) ([place]) 12/24/2017  . Hyperglycemia 12/24/2017  . Chronic diastolic heart failure ([place]) 09/30/2017  . Lymphedema 09/30/2017  . Aortic stenosis   .  Acute pain of both knees 02/12/2017  . Chronic gout due to renal impairment of multiple sites without tophus 02/12/2017  . Esophageal dysphagia 02/12/2017  . Osteoarthritis 01/22/2017  . Diabetic hyperosmolar non-ketotic state ([place]) 12/13/2016  . CAD (coronary artery disease) 12/13/2016  . Hyperlipidemia 12/13/2016  . Hyperphosphatemia 12/13/2016  . Acute diastolic CHF (congestive heart failure) ([place])   . Asthma 11/29/2015  . Depression 09/05/2015  . GERD (gastroesophageal reflux disease) 03/25/2015  . Environmental allergies 03/14/2015  . SOB (shortness of breath) 02/15/2015  . Chronic hepatitis C without hepatic coma ([place]) 01/31/2015  . Diabetic neuropathy ([place]) 01/31/2015  . OSA (obstructive sleep apnea) 01/01/2015  . Poor dentition 11/13/2014  . Essential hypertension 10/08/2014  . Morbid (severe) obesity due to excess calories ([place]) 10/08/2014  . Localized, primary osteoarthritis 01/25/2014  . Family history of diabetes mellitus 03/31/2013  . Pain in limb 03/31/2013  . Uncontrolled type 2 diabetes mellitus with hyperosmolar nonketotic hyperglycemia ([place]) 03/25/2012    Class: Chronic    Past Surgical History:  Procedure Laterality Date  . A/V FISTULAGRAM Left 04/11/2019   Procedure: A/V FISTULAGRAM;  Surgeon: Katha Cabal, MD;  Location: [facility];  Service: Cardiovascular;  Laterality: Left;  . A/V FISTULAGRAM Left 06/02/2019   Procedure: A/V FISTULAGRAM;  Surgeon: Katha Cabal, MD;  Location: [facility];  Service: Cardiovascular;  Laterality: Left;  . APPLICATION OF WOUND VAC Left 08/14/2017   Procedure: APPLICATION OF WOUND VAC Exchange;  Surgeon: Robert Bellow, MD;  Location: [facility];  Service: General;  Laterality: Left;  . APPLICATION OF WOUND VAC Left 12/21/2018   Procedure: APPLICATION OF WOUND VAC;  Surgeon: Katha Cabal, MD;  Location: [facility];  Service: Vascular;  Laterality: Left;  . AV FISTULA PLACEMENT Left 08/19/2018    Procedure: ARTERIOVENOUS (AV) FISTULA CREATION ( BRACHIOBASILIC );  Surgeon: Katha Cabal, MD;  Location: [facility];  Service: Vascular;  Laterality: Left;  . BASCILIC VEIN TRANSPOSITION Left 11/18/2018   Procedure: BASCILIC VEIN TRANSPOSITION;  Surgeon: Katha Cabal, MD;  Location: [facility];  Service: Vascular;  Laterality: Left;  . CHOLECYSTECTOMY    . COLONOSCOPY WITH PROPOFOL N/A 02/03/2018   Procedure: COLONOSCOPY WITH PROPOFOL;  Surgeon: Lin Landsman, MD;  Location: [facility] ENDOSCOPY;  Service: Gastroenterology;  Laterality: N/A;  . CORONARY ANGIOPLASTY  07/2015   STENT  . CORONARY STENT INTERVENTION N/A 09/18/2016   Procedure: Coronary Stent Intervention;  Surgeon: Troy Sine, MD;  Location: [facility];  Service: Cardiovascular;  Laterality: N/A;  . DIALYSIS/PERMA CATHETER INSERTION N/A 05/10/2018   Procedure: DIALYSIS/PERMA CATHETER INSERTION;  Surgeon: Katha Cabal, MD;  Location: [facility];  Service: Cardiovascular;  Laterality: N/A;  . DRESSING CHANGE UNDER ANESTHESIA Left 08/15/2017   Procedure: exploration of wound for bleeding;  Surgeon: Robert Bellow, MD;  Location: [facility];  Service: General;  Laterality: Left;  . ESOPHAGOGASTRODUODENOSCOPY (EGD) WITH PROPOFOL N/A 02/03/2018   Procedure: ESOPHAGOGASTRODUODENOSCOPY (EGD) WITH PROPOFOL;  Surgeon: Lin Landsman, MD;  Location[facility]COPY;  Service: Gastroenterology;  Laterality: N/A;  . EYE SURGERY  11/17/2018  . INCISION AND DRAINAGE ABSCESS Left 08/12/2017   Procedure: INCISION AND DRAINAGE ABSCESS;  Surgeon: Robert Bellow, MD;  Location: [facility];  Service: General;  Laterality: Left;  . KNEE ARTHROSCOPY    . LEFT HEART CATH N/A 09/18/2016   Procedure: Left Heart Cath;  Surgeon: Troy Sine, MD;  Location: [facility];  Service: Cardiovascular;  Laterality: N/A;  . LEFT HEART CATH AND CORONARY ANGIOGRAPHY N/A 09/16/2016   Procedure: Left Heart Cath and Coronary  Angiography;  Surgeon: Burnell Blanks, MD;  Location: [facility];  Service: Cardiovascular;  Laterality: N/A;  . LEFT HEART CATH AND CORONARY ANGIOGRAPHY N/A 04/29/2017   Procedure: LEFT HEART CATH AND CORONARY ANGIOGRAPHY;  Surgeon: Nelva Bush, MD;  Location: [facility];  Service: Cardiovascular;  Laterality: N/A;  . LOWER EXTREMITY ANGIOGRAPHY Right 03/08/2018   Procedure: LOWER EXTREMITY ANGIOGRAPHY;  Surgeon: Katha Cabal, MD;  Location: [facility];  Service: Cardiovascular;  Laterality: Right;  . TUBAL LIGATION    . TUBAL LIGATION    . UPPER EXTREMITY ANGIOGRAPHY Right 09/19/2019   Procedure: UPPER EXTREMITY ANGIOGRAPHY;  Surgeon: Katha Cabal, MD;  Location: [facility];  Service: Cardiovascular;  Laterality: Right;  . WOUND DEBRIDEMENT Left 12/21/2018   Procedure: DEBRIDEMENT WOUND;  Surgeon: Katha Cabal, MD;  Location: [facility];  Service: Vascular;  Laterality: Left;  . WOUND DEBRIDEMENT Left 12/30/2018   Procedure: DEBRIDEMENT WOUND WITH VAC PLACEMENT (LEFT UPPER EXTREMITY);  Surgeon: Katha Cabal, MD;  Location: [facility];  Service: Vascular;  Laterality: Left;     OB History    Gravida  4   Para  3   Term  3   Preterm      AB  1   Living  3     SAB  1   IAB      Ectopic      Multiple      Live Births  3           Family History  Problem Relation Age of Onset  . Colon cancer Mother   . Heart attack Other   . Heart attack Maternal Grandmother   . Hypertension Sister   . Hypertension Brother   . Diabetes Paternal Grandmother   . Breast cancer Neg Hx     Social History   Tobacco Use  . Smoking status: Former Smoker    Packs/day: 0.25    Years: 6.00    Pack years: 1.50    Types: Cigarettes    Quit date: 10/25/1980    Years since quitting: 39.9  . Smokeless tobacco: Never Used  Vaping Use  . Vaping Use: Never used  Substance Use Topics  . Alcohol use: Yes    Comment: rare  .  Drug use: Not Currently    Types: Marijuana    Home Medications Prior to Admission medications   Medication Sig Start Date End Date Taking? Authorizing Provider  albuterol (VENTOLIN HFA) 108 (90 Base) MCG/ACT inhaler Inhale 2 puffs into the lungs every 4 (four) hours as needed for wheezing or shortness of breath. 06/28/20   [name], PA-C  allopurinol (ZYLOPRIM) 100 MG tablet TAKE 1 TABLET  BY MOUTH ONCE DAILY. Patient taking differently: Take 100 mg by mouth daily. 12/08/19   [name], PA-C  aspirin EC 81 MG EC tablet Take 1 tablet (81 mg total) by mouth daily. 09/19/16   Strader, Fransisco Hertz, PA-C  atorvastatin (LIPITOR) 80 MG tablet TAKE 1 TABLET BY MOUTH ONCE DAILY AT 6PM Patient taking differently: Take 80 mg by mouth every evening. 07/08/20   [name], PA-C  AURYXIA 1 GM 210 MG(Fe) tablet Take 2 tablets (420 mg total) by mouth 3 (three) times daily with meals. 05/03/20   [name], PA-C  buPROPion (WELLBUTRIN SR) 150 MG 12 hr tablet Take 1 tablet (150 mg total) by mouth 2 (two) times daily. 05/03/20   [name], PA-C  carvedilol (COREG) 25 MG tablet TAKE (1) TABLET BY MOUTH TWICE A DAY WITH MEALS (BREAKFAST AND SUPPER) Patient taking differently: Take 25 mg by mouth 2 (two) times daily with a meal. 07/15/20   Burnette, Clearnce Sorrel, PA-C  Cinacalcet HCl (SENSIPAR PO) Take 30 mg by mouth every dialysis. 01/04/20 01/02/21  [provider]  Continuous Blood Gluc Receiver (FREESTYLE LIBRE 14 DAY READER) DEVI To check blood sugar ACHS 05/24/20   [name], PA-C  Continuous Blood Gluc Sensor (FREESTYLE LIBRE 14 DAY SENSOR) MISC To check blood sugar ACHS; change every 14 days 05/24/20   [name], PA-C  cyclobenzaprine (FLEXERIL) 5 MG tablet Take 1 tablet (5 mg total) by mouth 3 (three) times daily as needed for muscle spasms. Patient not taking: No sig reported 08/10/19   [name], PA-C  dicyclomine  (BENTYL) 20 MG tablet TAKE 1 TABLET(20 MG) BY MOUTH FOUR TIMES DAILY BEFORE MEALS AND AT BEDTIME Patient taking differently: Take 20 mg by mouth 4 (four) times daily -  before meals and at bedtime. 10/16/19   [name], PA-C  docusate sodium (COLACE) 100 MG capsule Take 1 capsule (100 mg total) by mouth 2 (two) times daily as needed for mild constipation. 09/20/17   Gouru, Illene Silver, MD  doxepin (SINEQUAN) 10 MG capsule TAKE 1 CAPSULE(10 MG) BY MOUTH FOUR TIMES DAILY AS NEEDED FOR ITCHING Patient taking differently: Take 10 mg by mouth 2 (two) times daily. 05/03/20   [name], PA-C  DULERA 200-5 MCG/ACT AERO Inhale 2 puffs into the lungs 2 (two) times daily as needed.  11/26/19   [provider]  fluticasone (FLONASE) 50 MCG/ACT nasal spray Place 2 sprays into both nostrils daily as needed for allergies or rhinitis. 09/28/17   [name], PA-C  fluticasone furoate-vilanterol (BREO ELLIPTA) 200-25 MCG/INH AEPB Inhale 1 puff into the lungs daily. 08/30/20   [name], PA-C  gentamicin cream (GARAMYCIN) 0.1 % Apply 1 application topically 2 (two) times daily. Patient not taking: No sig reported 11/20/19   Edrick Kins, DPM  hydrALAZINE (APRESOLINE) 100 MG tablet TAKE (1) TABLET BY MOUTH THREE TIMES DAILY Patient taking differently: Take 100 mg by mouth 3 (three) times daily. 07/15/20   [name], PA-C  hydrOXYzine (ATARAX/VISTARIL) 25 MG tablet Take 1 tablet (25 mg total) by mouth 4 (four) times daily. 05/03/20   [name], PA-C  insulin detemir (LEVEMIR FLEXTOUCH) 100 UNIT/ML FlexPen Inject 60 Units into the skin 2 (two) times daily. Patient not taking: No sig reported 05/24/20   [name], PA-C  insulin glargine (LANTUS) 100 unit/mL SOPN Inject 25 Units into the skin 2 (two) times daily.  [provider]  insulin lispro (HUMALOG KWIKPEN) 100 UNIT/ML KwikPen Inject 25 Units into the skin with breakfast, with lunch,  and with evening meal. Patient taking differently: Inject 7 Units into the skin 2 (two) times daily. 05/14/20   [name], PA-C  lidocaine-prilocaine (EMLA) cream Apply 1 application topically as needed. Patient taking differently: Apply 1 application topically as needed (dialysis days). 03/27/19   Schnier, Dolores Lory, MD  loperamide (IMODIUM A-D) 2 MG tablet Take 1 tablet (2 mg total) by mouth as needed (diarrhea). 10/21/19 10/19/20  [provider]  loratadine (CLARITIN) 10 MG tablet TAKE 1 TABLET BY MOUTH ONCE DAILY. Patient taking differently: Take 10 mg by mouth daily as needed for allergies. 07/15/20   [name], PA-C  mometasone (ELOCON) 0.1 % cream APPLY AS DIRECTED TO AFFECTED AREA ONCE DAILY. Patient taking differently: Apply 1 application topically daily as needed. 12/08/18   [name], PA-C  montelukast (SINGULAIR) 10 MG tablet TAKE ONE TABLET BY MOUTH AT BEDTIME. Patient taking differently: Take 10 mg by mouth at bedtime as needed (allergies). 07/15/20   [name], PA-C  nitroGLYCERIN (NITROSTAT) 0.4 MG SL tablet Place 1 tablet (0.4 mg total) under the tongue every 5 (five) minutes x 3 doses as needed for chest pain. 05/03/20   [name], PA-C  Nutritional Supplements (FEEDING SUPPLEMENT, NEPRO CARB STEADY,) LIQD Take 237 mLs by mouth daily. Patient not taking: No sig reported 02/01/20   Loletha Grayer, MD  nystatin (MYCOSTATIN) 100000 UNIT/ML suspension Take 5 mLs (500,000 Units total) by mouth 4 (four) times daily. Patient not taking: No sig reported 06/28/20   [name], PA-C  nystatin cream (MYCOSTATIN) Apply 1 application topically 2 (two) times daily. Patient taking differently: Apply 1 application topically daily as needed for dry skin. 08/10/19   [name], PA-C  omeprazole (PRILOSEC) 20 MG capsule TAKE (1) CAPSULE BY MOUTH ONCE DAILY. Patient taking differently: Take 20 mg by mouth daily as needed  (refkux). 07/15/20   [name], PA-C  oxyCODONE (ROXICODONE) 5 MG immediate release tablet Take 1 tablet (5 mg total) by mouth every 12 (twelve) hours as needed for severe pain. 08/30/20   [name], PA-C  pregabalin (LYRICA) 150 MG capsule TAKE 1 CAPSULE(150 MG) BY MOUTH TWICE DAILY Patient taking differently: Take 150 mg by mouth 2 (two) times daily. 02/29/20   Burnette, Jennifer M, PA-C  SENNA-PLUS 8.6-50 MG tablet TAKE ONE TABLET BY MOUTH AT BEDTIME. Patient not taking: No sig reported 07/15/20   [name], PA-C  sevelamer carbonate (RENVELA) 800 MG tablet Take 800 mg by mouth 3 (three) times daily. 08/21/20 08/21/21  [provider]  silver sulfADIAZINE (SILVADENE) 1 % cream Apply 1 application topically daily. Apply to right first toe Patient taking differently: Apply 1 application topically daily as needed (pain). 02/21/19   Kris Hartmann, NP  torsemide (DEMADEX) 20 MG tablet TAKE (2) TABLETS BY MOUTH TWICE DAILY. Patient taking differently: Take 40 mg by mouth 2 (two) times daily. 05/21/20   [name], PA-C  triamcinolone cream (KENALOG) 0.1 % APPLY TO AFFECTED AREA TWICE DAILY Patient taking differently: Apply 1 application topically 2 (two) times daily as needed (rash). 06/17/18   [name], PA-C  valACYclovir (VALTREX) 500 MG tablet TAKE (1) TABLET BY MOUTH EVERY OTHER DAY. Patient taking differently: Take 500 mg by mouth every other day. 09/06/20   [name], PA-C  VICTOZA 18  MG/3ML SOPN Start with 0.57m daily and increase by 0.635mper week until max dose of 1.8 mg dose achieved. Patient not taking: No sig reported 03/25/20   BuMar DaringPA-C  Vitamin D, Ergocalciferol, (DRISDOL) 1.25 MG (50000 UNIT) CAPS capsule TAKE 1 CAPSULE BY MOUTH ONCE A MONTH Patient taking differently: Take 50,000 Units by mouth every 30 (thirty) days. 07/17/20   BuMar DaringPA-C  gabapentin (NEURONTIN) 100 MG capsule Take 1 capsule  (100 mg total) by mouth 3 (three) times daily. 08/19/17 02/03/19  PaFritzi MandesMD    Allergies    Shellfish allergy and Diazepam  Review of Systems   Review of Systems  All other systems reviewed and are negative.   Physical Exam Updated Vital Signs BP (!) 122/54   Pulse 93   Temp 99.2 F (37.3 C) (Oral)   Resp 17   Ht _0  (1.727 m)   Wt 126 kg   SpO2 100%   BMI 42.24 kg/m   Physical Exam Vitals and nursing note reviewed.  Constitutional:      General: She is not in acute distress.    Appearance: She is well-developed and well-nourished.  HENT:     Head: Normocephalic and atraumatic.     Mouth/Throat:     Mouth: Oropharynx is clear and moist.  Eyes:     Extraocular Movements: EOM normal.     Conjunctiva/sclera: Conjunctivae normal.     Pupils: Pupils are equal, round, and reactive to light.  Cardiovascular:     Rate and Rhythm: Normal rate and regular rhythm.     Heart sounds: Normal heart sounds.  Pulmonary:     Effort: Pulmonary effort is normal. No respiratory distress.     Breath sounds: Normal breath sounds.  Abdominal:     General: There is no distension.     Palpations: Abdomen is soft.     Tenderness: There is no abdominal tenderness.     Comments: Ostomy bag in place.  Bag is full of dark tarry stool.  Musculoskeletal:        General: No deformity or edema. Normal range of motion.     Cervical back: Normal range of motion and neck supple.  Skin:    General: Skin is warm and dry.  Neurological:     Mental Status: She is alert and oriented to person, place, and time.  Psychiatric:        Mood and Affect: Mood and affect normal.     ED Results / Procedures / Treatments   Labs (all labs ordered are listed, but only abnormal results are displayed) Labs Reviewed  CBC WITH DIFFERENTIAL/PLATELET - Abnormal; Notable for the following components:      Result Value   RBC 2.01 (*)    Hemoglobin 6.4 (*)    HCT 20.1 (*)    Abs Immature Granulocytes 0.12  (*)    All other components within normal limits  CBG MONITORING, ED - Abnormal; Notable for the following components:   Glucose-Capillary >600 (*)    All other components within normal limits  POC OCCULT BLOOD, ED - Abnormal; Notable for the following components:   Fecal Occult Bld POSITIVE (*)    All other components within normal limits  RESP PANEL BY RT-PCR (FLU A&B, COVID) [id number]  PROTIME-INR  COMPREHENSIVE METABOLIC PANEL  LIPASE, BLOOD  LACTIC ACID, PLASMA  LACTIC ACID, PLASMA  TYPE AND SCREEN  PREPARE RBC (CROSSMATCH)    EKG EKG Interpretation  Date/Time:  Tuesday September 17 2020 20:18:15 EST Ventricular Rate:  91 PR Interval:    QRS Duration: 95 QT Interval:  441 QTC Calculation: 543 R Axis:   42 Text Interpretation: Sinus rhythm Prolonged QT interval Confirmed by Dene Gentry [phone]) on 09/17/2020 8:22:05 PM   Radiology No results found.  Procedures Procedures  CRITICAL CARE Performed by: Valarie Merino   Total critical care time: 45 minutes  Critical care time was exclusive of separately billable procedures and treating other patients.  Critical care was necessary to treat or prevent imminent or life-threatening deterioration.  Critical care was time spent personally by me on the following activities: development of treatment plan with patient and/or surrogate as well as nursing, discussions with consultants, evaluation of patient's response to treatment, examination of patient, obtaining history from patient or surrogate, ordering and performing treatments and interventions, ordering and review of laboratory studies, ordering and review of radiographic studies, pulse oximetry and re-evaluation of patient's condition.     Medications Ordered in ED Medications  pantoprazole (PROTONIX) 80 mg in sodium chloride 0.9 % 100 mL IVPB (has no administration in time range)  0.9 %  sodium chloride infusion (has no administration in time range)    ED Course  I  have reviewed the triage vital signs and the nursing notes.  Pertinent labs & imaging results that were available during my care of the patient were reviewed by me and considered in my medical decision making (see chart for details).    MDM Rules/Calculators/A&P                          MDM  Screen complete  [name] was evaluated in Emergency Department on 09/17/2020 for the symptoms described in the history of present illness. She was evaluated in the context of the global COVID-19 pandemic, which necessitated consideration that the patient might be at risk for infection with the SARS-CoV-2 virus that causes COVID-19. Institutional protocols and algorithms that pertain to the evaluation of patients at risk for COVID-19 are in a state of rapid change based on information released by regulatory bodies including the CDC and federal and state organizations. These policies and algorithms were followed during the patient's care in the ED.  Is presenting for evaluation of reported dark tarry stool from her colostomy.  Patient's exam is consistent with likely GI bleed.  Patient with noted drop in hemoglobin.  Patient also noted to have significant hyperglycemia.  Anion gap is 11.  Patient will require transfusion.  Patient agrees to plan to admit and consents to blood transfusion.  Again, patient has capacity to make her own medical decisions.  Patient does agree to blood transfusion.  Both RN and MD were in the room when patient consented to blood.  Dr. Myna Hidalgo of the hospitalist service is aware of case and will evaluate for admission.    Final Clinical Impression(s) / ED Diagnoses Final diagnoses:  Gastrointestinal hemorrhage, unspecified gastrointestinal hemorrhage type  Hyperglycemia    Rx / DC Orders ED Discharge Orders    None       Valarie Merino, MD 09/17/20 [id number]

## 2020-09-18 ENCOUNTER — Other Ambulatory Visit: Payer: Self-pay

## 2020-09-18 DIAGNOSIS — K922 Gastrointestinal hemorrhage, unspecified: Secondary | ICD-10-CM | POA: Diagnosis not present

## 2020-09-18 LAB — BASIC METABOLIC PANEL
Anion gap: 14 (ref 5–15)
Anion gap: 12 (ref 5–15)
Anion gap: 12 (ref 5–15)
Anion gap: 12 (ref 5–15)
BUN: 72 mg/dL — ABNORMAL HIGH (ref 8–23)
BUN: 78 mg/dL — ABNORMAL HIGH (ref 8–23)
BUN: 81 mg/dL — ABNORMAL HIGH (ref 8–23)
BUN: 81 mg/dL — ABNORMAL HIGH (ref 8–23)
CO2: 22 mmol/L (ref 22–32)
CO2: 25 mmol/L (ref 22–32)
CO2: 25 mmol/L (ref 22–32)
CO2: 25 mmol/L (ref 22–32)
Calcium: 7.8 mg/dL — ABNORMAL LOW (ref 8.9–10.3)
Calcium: 8.3 mg/dL — ABNORMAL LOW (ref 8.9–10.3)
Calcium: 8.2 mg/dL — ABNORMAL LOW (ref 8.9–10.3)
Calcium: 8.4 mg/dL — ABNORMAL LOW (ref 8.9–10.3)
Chloride: 94 mmol/L — ABNORMAL LOW (ref 98–111)
Chloride: 95 mmol/L — ABNORMAL LOW (ref 98–111)
Chloride: 97 mmol/L — ABNORMAL LOW (ref 98–111)
Chloride: 98 mmol/L (ref 98–111)
Creatinine, Ser: 5.14 mg/dL — ABNORMAL HIGH (ref 0.44–1.00)
Creatinine, Ser: 5.47 mg/dL — ABNORMAL HIGH (ref 0.44–1.00)
Creatinine, Ser: 5.57 mg/dL — ABNORMAL HIGH (ref 0.44–1.00)
Creatinine, Ser: 6.08 mg/dL — ABNORMAL HIGH (ref 0.44–1.00)
GFR, Estimated: 9 mL/min — ABNORMAL LOW (ref >60)
GFR, Estimated: 8 mL/min — ABNORMAL LOW (ref >60)
GFR, Estimated: 8 mL/min — ABNORMAL LOW (ref >60)
GFR, Estimated: 7 mL/min — ABNORMAL LOW (ref >60)
Glucose, Bld: 691 mg/dL (ref 70–99)
Glucose, Bld: 428 mg/dL — ABNORMAL HIGH (ref 70–99)
Glucose, Bld: 215 mg/dL — ABNORMAL HIGH (ref 70–99)
Glucose, Bld: 145 mg/dL — ABNORMAL HIGH (ref 70–99)
Potassium: 5.7 mmol/L — ABNORMAL HIGH (ref 3.5–5.1)
Potassium: 4.9 mmol/L (ref 3.5–5.1)
Potassium: 4.1 mmol/L (ref 3.5–5.1)
Potassium: 4.3 mmol/L (ref 3.5–5.1)
Sodium: 130 mmol/L — ABNORMAL LOW (ref 135–145)
Sodium: 132 mmol/L — ABNORMAL LOW (ref 135–145)
Sodium: 134 mmol/L — ABNORMAL LOW (ref 135–145)
Sodium: 135 mmol/L (ref 135–145)

## 2020-09-18 LAB — CBC
HCT: 20.6 % — ABNORMAL LOW (ref 36.0–46.0)
Hemoglobin: 6.8 g/dL — CL (ref 12.0–15.0)
MCH: 31.2 pg (ref 26.0–34.0)
MCHC: 33 g/dL (ref 30.0–36.0)
MCV: 94.5 fL (ref 80.0–100.0)
Platelets: 262 10*3/uL (ref 150–400)
RBC: 2.18 MIL/uL — ABNORMAL LOW (ref 3.87–5.11)
RDW: 16.1 % — ABNORMAL HIGH (ref 11.5–15.5)
WBC: 10.7 10*3/uL — ABNORMAL HIGH (ref 4.0–10.5)
nRBC: 0 % (ref 0.0–0.2)

## 2020-09-18 LAB — CBG MONITORING, ED
Glucose-Capillary: 283 mg/dL — ABNORMAL HIGH (ref 70–99)
Glucose-Capillary: 324 mg/dL — ABNORMAL HIGH (ref 70–99)
Glucose-Capillary: 392 mg/dL — ABNORMAL HIGH (ref 70–99)
Glucose-Capillary: 409 mg/dL — ABNORMAL HIGH (ref 70–99)
Glucose-Capillary: 433 mg/dL — ABNORMAL HIGH (ref 70–99)
Glucose-Capillary: 448 mg/dL — ABNORMAL HIGH (ref 70–99)
Glucose-Capillary: 508 mg/dL (ref 70–99)
Glucose-Capillary: 584 mg/dL (ref 70–99)
Glucose-Capillary: 590 mg/dL (ref 70–99)
Glucose-Capillary: 592 mg/dL (ref 70–99)
Glucose-Capillary: >600 mg/dL (ref 70–99)
Glucose-Capillary: >600 mg/dL (ref 70–99)

## 2020-09-18 LAB — OSMOLALITY: Osmolality: 340 mOsm/kg (ref 275–295)

## 2020-09-18 LAB — GLUCOSE, CAPILLARY
Glucose-Capillary: 129 mg/dL — ABNORMAL HIGH (ref 70–99)
Glucose-Capillary: 133 mg/dL — ABNORMAL HIGH (ref 70–99)
Glucose-Capillary: 139 mg/dL — ABNORMAL HIGH (ref 70–99)
Glucose-Capillary: 140 mg/dL — ABNORMAL HIGH (ref 70–99)
Glucose-Capillary: 146 mg/dL — ABNORMAL HIGH (ref 70–99)
Glucose-Capillary: 153 mg/dL — ABNORMAL HIGH (ref 70–99)
Glucose-Capillary: 170 mg/dL — ABNORMAL HIGH (ref 70–99)

## 2020-09-18 LAB — LACTIC ACID, PLASMA: Lactic Acid, Venous: 3.2 mmol/L (ref 0.5–1.9)

## 2020-09-18 LAB — PREPARE RBC (CROSSMATCH)

## 2020-09-18 MED ORDER — LIDOCAINE HCL (PF) 1 % IJ SOLN
5.0000 mL | INTRAMUSCULAR | Status: DC | PRN
  Filled 2020-09-18: qty 5

## 2020-09-18 MED ORDER — PENTAFLUOROPROP-TETRAFLUOROETH EX AERO: 1.0000 "application " | INHALATION_SPRAY | CUTANEOUS | Status: DC | PRN

## 2020-09-18 MED ORDER — SODIUM CHLORIDE 0.9% IV SOLUTION: Freq: Once | INTRAVENOUS | Status: AC

## 2020-09-18 MED ORDER — CHLORHEXIDINE GLUCONATE CLOTH 2 % EX PADS
6.0000 | MEDICATED_PAD | Freq: Every day | CUTANEOUS | Status: DC
  Administered 2020-09-19 – 2020-09-21 (×2): 6 via TOPICAL

## 2020-09-18 MED ORDER — INSULIN ASPART 100 UNIT/ML ~~LOC~~ SOLN
0.0000 [IU] | SUBCUTANEOUS | Status: DC
  Administered 2020-09-19 (×4): 3 [IU] via SUBCUTANEOUS
  Administered 2020-09-19: 2 [IU] via SUBCUTANEOUS
  Administered 2020-09-20 (×2): 3 [IU] via SUBCUTANEOUS
  Administered 2020-09-20: 5 [IU] via SUBCUTANEOUS
  Administered 2020-09-20: 7 [IU] via SUBCUTANEOUS
  Administered 2020-09-20: 2 [IU] via SUBCUTANEOUS
  Administered 2020-09-21 (×2): 5 [IU] via SUBCUTANEOUS
  Administered 2020-09-21 (×2): 2 [IU] via SUBCUTANEOUS
  Administered 2020-09-22: 1 [IU] via SUBCUTANEOUS
  Administered 2020-09-22 (×2): 7 [IU] via SUBCUTANEOUS
  Administered 2020-09-22 (×2): 2 [IU] via SUBCUTANEOUS
  Administered 2020-09-22: 7 [IU] via SUBCUTANEOUS
  Administered 2020-09-23: 2 [IU] via SUBCUTANEOUS
  Administered 2020-09-23: 1 [IU] via SUBCUTANEOUS
  Administered 2020-09-23: 3 [IU] via SUBCUTANEOUS
  Administered 2020-09-23: 2 [IU] via SUBCUTANEOUS
  Administered 2020-09-23: 1 [IU] via SUBCUTANEOUS
  Administered 2020-09-24: 2 [IU] via SUBCUTANEOUS
  Administered 2020-09-24: 3 [IU] via SUBCUTANEOUS

## 2020-09-18 MED ORDER — SODIUM CHLORIDE 0.9 % IV SOLN: 100.0000 mL | INTRAVENOUS | Status: DC | PRN

## 2020-09-18 MED ORDER — LIDOCAINE-PRILOCAINE 2.5-2.5 % EX CREA
1.0000 "application " | TOPICAL_CREAM | CUTANEOUS | Status: DC | PRN
  Filled 2020-09-18: qty 5

## 2020-09-18 MED ORDER — INSULIN ASPART 100 UNIT/ML ~~LOC~~ SOLN
8.0000 [IU] | Freq: Once | SUBCUTANEOUS | Status: AC
  Administered 2020-09-18: 8 [IU] via SUBCUTANEOUS

## 2020-09-18 MED ORDER — INSULIN DETEMIR 100 UNIT/ML ~~LOC~~ SOLN
30.0000 [IU] | Freq: Two times a day (BID) | SUBCUTANEOUS | Status: DC
  Administered 2020-09-18 – 2020-09-21 (×7): 30 [IU] via SUBCUTANEOUS
  Filled 2020-09-18 (×10): qty 0.3

## 2020-09-18 NOTE — Progress Notes (Addendum)
[facility] Progress Note     Background: Monquie Fulgham is a 65 Y/O female with PMH of ESRD due to DM, started HD 06/2018.Type 2 DM, HTN, hyperlipidemia, obesity, Hx aortic stenosis, CAD (s/p stents), combined HF, Hep C, depression, OSA, Hx gout, GERD, tremors, Hx pancreatitis, necrotizing fasciitis ro R groin, anemia of ESRD, SHPT. Recent admission at [facility] 03/04-03/05/22 for acute hypoxic respiratory failure D/T pulmonary edema in setting of missed HD. She presented to ED via EMS 09/17/20 for dark, tarry stools. HGB was 6.4 on admission. FOBT positive. She was given 1 unit of PRBCs in ED on arrival. BS on admission was 638 without acidosis. K+5.3 on admission.  CXR with minimal vascular congestion without frank pulmonary edema. She has been admitted with symptomatic anemia per primary. GI has been consulted. She was started on insulin gtt per primary in ED. K+ has now normalized. Last OP HD 09/17/20 stayed 2:56 hours of 4 hour treatment, she left 1.2 kg above EDW.   Subjective: Seen in room, very lethargic, mucous membranes dry. Says she becomes SOB when she tried to talk, feels very weak.   Objective Vitals:   09/18/20 1230 09/18/20 1245 09/18/20 1352 09/18/20 1356  BP: 113/61 116/63  (!) 136/95  Pulse: 97 98  99  Resp: 15 16  17   Temp:   98.6 F (37 C) 98.6 F (37 C)  TempSrc:   Oral Oral  SpO2: 99% 99%  100%  Weight:    130.3 kg  Height:       Physical Exam General: ill appearing obese female in NAD HEENT: NCAT, mucous membranes dry, neck supple.  Heart: S1,S2, RRR. No M/R/G. Slight JVD at 30 degrees.  Lungs: Slightly decreased in bases otherwise CTAB. No WOB.  Abdomen: Obese, NT, active BS. Ostomy LLQ of abdomen with large maroon stool.  Extremities: trace to 1+ BLE pitting edema.  Dialysis Access: L AVF + bruit.    Additional Objective Labs: Basic Metabolic Panel: Recent Labs  Lab 09/14/20 0206 09/17/20 2020 09/18/20 0544 09/18/20 1046 09/18/20 1404  NA  130*   < > 130* 132* 134*  K 4.2   < > 5.7* 4.9 4.1  CL 93*   < > 94* 95* 97*  CO2 27   < > 22 25 25   GLUCOSE 392*   < > 691* 428* 215*  BUN 17   < > 72* 78* 81*  CREATININE 4.07*   < > 5.14* 5.47* 5.57*  CALCIUM 8.4*   < > 7.8* 8.3* 8.2*  PHOS 3.8  --   --   --   --    < > = values in this interval not displayed.   Liver Function Tests: Recent Labs  Lab 09/13/20 0445 09/14/20 0206 09/17/20 2020  AST 42*  --  46*  ALT 39  --  36  ALKPHOS 359*  --  326*  BILITOT 0.6  --  0.7  PROT 6.5  --  5.6*  ALBUMIN 2.9* 2.7* 2.5*   Recent Labs  Lab 09/17/20 2020  LIPASE 60*   CBC: Recent Labs  Lab 09/13/20 0445 09/13/20 0512 09/14/20 0206 09/17/20 2020 09/18/20 1046  WBC 4.4  --  5.3 8.1 10.7*  NEUTROABS 2.7  --   --  4.4  --   HGB 9.1*   < > 9.4* 6.4* 6.8*  HCT 29.4*   < > 28.4* 20.1* 20.6*  MCV 101.7*  --  97.9 100.0 94.5  PLT 136*  --  141* 237 262   < > = values in this interval not displayed.   Blood Culture    Component Value Date/Time   SDES BLOOD RIGHT FA 12/28/2018 2315   SPECREQUEST  12/28/2018 2315    BOTTLES DRAWN AEROBIC AND ANAEROBIC Blood Culture adequate volume   CULT  12/28/2018 2315    NO GROWTH 5 DAYS Performed at [facility], [address] [place], [place] [address]    REPTSTATUS 01/02/2019 FINAL 12/28/2018 2315    Cardiac Enzymes: No results for input(s): CKTOTAL, CKMB, CKMBINDEX, TROPONINI in the last 168 hours. CBG: Recent Labs  Lab 09/18/20 0954 09/18/20 1042 09/18/20 1126 09/18/20 1250 09/18/20 1355  GLUCAP 409* 392* 324* 283* 170*   Iron Studies: No results for input(s): IRON, TIBC, TRANSFERRIN, FERRITIN in the last 72 hours. @lablastinr3 @ Studies/Results: DG Chest Port 1 View  Result Date: 09/17/2020 CLINICAL DATA:  Weakness EXAM: PORTABLE CHEST 1 VIEW COMPARISON:  Radiograph 09/13/2020 FINDINGS: Stable cardiomediastinal contours. Some minimal vascular congestion without features of frank edema. No consolidation,  pneumothorax, or effusion. Telemetry leads overlie the chest. No acute osseous or soft tissue abnormality. IMPRESSION: Minimal vascular congestion without features of frank edema. No other acute cardiopulmonary abnormality. Electronically Signed   By: Lovena Le M.D.   On: 09/17/2020 22:07   Medications: . dextrose 5% lactated ringers 10 mL/hr at 09/18/20 1413  . insulin 1.7 Units/hr (09/18/20 1406)  . lactated ringers Stopped (09/18/20 1406)  . pantoprozole (PROTONIX) infusion 8 mg/hr (09/18/20 1045)   . mometasone-formoterol  2 puff Inhalation BID  . [START ON 09/21/2020] pantoprazole  40 mg Intravenous Q12H   HD orders: [place] T,Th,S 4 hrs 180NRe 400/500 126.5 kg 2.0K/2.0 Ca L AVF -Heparin 6000 units IV TIW (DC upon Discharge) -Hectorol 4 mcg IV TIW -Sensipar 30 mg PO TIW -Mircera 75 mcg IV q 2 weeks (last HGB on DC 09/14/20 9.4)  Assessment/Plan: 1. UGI bleed-HGB 6.4 on admit. S/P 1 unit PRBCs, HGB now 6.8. GI has been consulted.  2. HHS-BS 693 CO2 22 AG 14 on admission. Started on insulin per primary. Last BS 215 this PM. Per primary.  3. ESRD -T,Th,S. Short HD off schedule, then resume regular treatment tomorrow. K+ 4.1. DC Heparin with HD.  3. Anemia - As noted above. HGB 6.8 today. Transfuse on 1 unit of PRBCs today on HD. Check iron panel tomorrow. Follow HGB.  4. Secondary hyperparathyroidism - C Ca OK. Continue sensipar, VDRA.  5. HTN/volume -CXR with mild pulmonary congestion. Trace ankle edema. BP low for her. Does C/O SOB when talking. UF 2 liters today.  6. Nutrition - NPO at present. Low Albumin. Renal/Carb mod diet. Protein supps when able to eat.  7. H/O CAD 8. H/O Necrotizing fasciitis.    Rita H. Brown NP-C 09/18/2020, 2:53 PM  [employer] [phone]

## 2020-09-18 NOTE — Progress Notes (Signed)
PROGRESS NOTE    [name]  DSK:[id number] DOB: 1955/08/01 DOA: 09/17/2020 PCP: [name], PA-C     Brief Narrative:  [name] is a 66 y.o. female with medical history significant for coronary artery disease, insulin-dependent diabetes mellitus, chronic anemia, hepatitis C, depression, anxiety, ESRD on hemodialysis, and necrotizing fasciitis status post multiple debridements and colostomy in January, now presenting with fatigue, nausea, and dark tarry colostomy output.  Patient's daughter provided additional history by phone.  Patient lives with her daughter and they noted very dark and tarry output from the colostomy beginning 2 days ago and increasing.  Patient has also developed nausea and fatigue.  She has not been eating or drinking much over the past couple days due to the nausea.  She was at dialysis 3/8 and reportedly had approximately 30 minutes remaining in her session when it was stopped due to severe nausea.  Patient reports that she did not actually vomit.  In addition to fatigue and nausea, she has had increased shortness of breath. Patient's daughter reports that the groin wounds have healed well.  Patient denies any fevers or chills.  She takes aspirin 81 mg daily, uses omeprazole only as needed and has not taken it in a few days, and denies any over-the-counter analgesic use.  New events last 24 hours / Subjective: Patient examined in the emergency department.  She states that she is "burning up."  Denies any further nausea, no vomiting.  Still having some tenderness down in her groin where her wound has been healing.  Assessment & Plan:   Principal Problem:   Upper GI bleed Active Problems:   Uncontrolled type 2 diabetes mellitus with hyperosmolar nonketotic hyperglycemia (HCC)   Essential hypertension   Hyperkalemia   CAD (coronary artery disease)   ESRD (end stage renal disease) on dialysis (HCC)   Prolonged QT interval   COPD (chronic obstructive  pulmonary disease) (HCC)    GI bleeding, symptomatic anemia -FOBT positive with hemoglobin 6.4 on admission -Transfused 1 unit pRBC 3/9. Transfuse another 1u pRBC now to maintain hgb > 7  -IV PPI -GI consulted  Hyperosmolar hyperglycemic state -Continue IV insulin, IVF   ESRD -On hemodialysis TTSa -Nephrology consulted  COPD -Without exacerbation  CAD -Without acute coronary syndrome -Aspirin on hold due to GI bleed  Prolonged QT interval -Minimize QT prolonging medications  Hyperkalemia -Resolved  Recent history of necrotizing fasciitis -Was hospitalized at [facility], has undergone multiple debridements with ostomy placement in December 2021 -Wound does not appear to be acutely infected at this time   DVT prophylaxis:  SCDs Start: 09/17/20 2246  Code Status: Full code Family Communication: No family at bedside Disposition Plan:  Status is: Inpatient  Remains inpatient appropriate because:IV treatments appropriate due to intensity of illness or inability to take PO and Inpatient level of care appropriate due to severity of illness   Dispo: The patient is from: Home              Anticipated d/c is to: Home              Patient currently is not medically stable to d/c.   Difficult to place patient No      Consultants:   GI  Nephrology   Procedures:   None   Antimicrobials:  Anti-infectives (From admission, onward)   None        Objective: Vitals:   09/18/20 1230 09/18/20 1245 09/18/20 1352 09/18/20 1356  BP: 113/61 116/63  [name])  136/95  Pulse: 97 98  99  Resp: 15 16  17   Temp:   98.6 F (37 C) 98.6 F (37 C)  TempSrc:   Oral Oral  SpO2: 99% 99%  100%  Weight:    130.3 kg  Height:       No intake or output data in the 24 hours ending 09/18/20 1536 Filed Weights   09/17/20 2020 09/18/20 1356  Weight: 126 kg 130.3 kg    Examination:  General exam: Appears calm and comfortable  Respiratory system: Clear to auscultation.  Respiratory effort normal. No respiratory distress. No conversational dyspnea.  Cardiovascular system: S1 & S2 heard, RRR. No murmurs. No pedal edema. Gastrointestinal system: Abdomen is nondistended, soft and nontender. Normal bowel sounds heard. Central nervous system: Alert and oriented. No focal neurological deficits. Speech clear.  Extremities: Symmetric in appearance  Skin:    Psychiatry: Judgement and insight appear normal. Mood & affect appropriate.   Data Reviewed: I have personally reviewed following labs and imaging studies  CBC: Recent Labs  Lab 09/13/20 0445 09/13/20 0512 09/13/20 0537 09/14/20 0206 09/17/20 2020 09/18/20 1046  WBC 4.4  --   --  5.3 8.1 10.7*  NEUTROABS 2.7  --   --   --  4.4  --   HGB 9.1* 10.2* 10.5* 9.4* 6.4* 6.8*  HCT 29.4* 30.0* 31.0* 28.4* 20.1* 20.6*  MCV 101.7*  --   --  97.9 100.0 94.5  PLT 136*  --   --  141* 237 267   Basic Metabolic Panel: Recent Labs  Lab 09/14/20 0206 09/17/20 2020 09/18/20 0544 09/18/20 1046 09/18/20 1404  NA 130* 128* 130* 132* 134*  K 4.2 5.3* 5.7* 4.9 4.1  CL 93* 92* 94* 95* 97*  CO2 27 25 22 25 25   GLUCOSE 392* 693* 691* 428* 215*  BUN 17 55* 72* 78* 81*  CREATININE 4.07* 4.51* 5.14* 5.47* 5.57*  CALCIUM 8.4* 7.6* 7.8* 8.3* 8.2*  PHOS 3.8  --   --   --   --    GFR: Estimated Creatinine Clearance: 15 mL/min (A) (by C-G formula based on SCr of 5.57 mg/dL (H)). Liver Function Tests: Recent Labs  Lab 09/13/20 0445 09/14/20 0206 09/17/20 2020  AST 42*  --  46*  ALT 39  --  36  ALKPHOS 359*  --  326*  BILITOT 0.6  --  0.7  PROT 6.5  --  5.6*  ALBUMIN 2.9* 2.7* 2.5*   Recent Labs  Lab 09/17/20 2020  LIPASE 60*   No results for input(s): AMMONIA in the last 168 hours. Coagulation Profile: Recent Labs  Lab 09/17/20 2020  INR 1.1   Cardiac Enzymes: No results for input(s): CKTOTAL, CKMB, CKMBINDEX, TROPONINI in the last 168 hours. BNP (last 3 results) No results for input(s): PROBNP in  the last 8760 hours. HbA1C: No results for input(s): HGBA1C in the last 72 hours. CBG: Recent Labs  Lab 09/18/20 1042 09/18/20 1126 09/18/20 1250 09/18/20 1355 09/18/20 1504  GLUCAP 392* 324* 283* 170* 153*   Lipid Profile: No results for input(s): CHOL, HDL, LDLCALC, TRIG, CHOLHDL, LDLDIRECT in the last 72 hours. Thyroid Function Tests: No results for input(s): TSH, T4TOTAL, FREET4, T3FREE, THYROIDAB in the last 72 hours. Anemia Panel: No results for input(s): VITAMINB12, FOLATE, FERRITIN, TIBC, IRON, RETICCTPCT in the last 72 hours. Sepsis Labs: Recent Labs  Lab 09/17/20 2110 09/18/20 0544  LATICACIDVEN 2.6* 3.2*    Recent Results (from the past 240 hour(s))  Resp Panel by RT-PCR (Flu A&B, Covid) Nasopharyngeal Swab     Status: None   Collection Time: 09/13/20  4:45 AM   Specimen: Nasopharyngeal Swab; Nasopharyngeal(NP) swabs in vial transport medium  Result Value Ref Range Status   SARS Coronavirus 2 by RT PCR NEGATIVE NEGATIVE Final    Comment: (NOTE) SARS-CoV-2 target nucleic acids are NOT DETECTED.  The SARS-CoV-2 RNA is generally detectable in upper respiratory specimens during the acute phase of infection. The lowest concentration of SARS-CoV-2 viral copies this assay can detect is 138 copies/mL. A negative result does not preclude SARS-Cov-2 infection and should not be used as the sole basis for treatment or other patient management decisions. A negative result may occur with  improper specimen collection/handling, submission of specimen other than nasopharyngeal swab, presence of viral mutation(s) within the areas targeted by this assay, and inadequate number of viral copies(<138 copies/mL). A negative result must be combined with clinical observations, patient history, and epidemiological information. The expected result is Negative.  Fact Sheet for Patients:  EntrepreneurPulse.com.au  Fact Sheet for Healthcare Providers:   IncredibleEmployment.be  This test is no t yet approved or cleared by the [place] FDA and  has been authorized for detection and/or diagnosis of SARS-CoV-2 by FDA under an Emergency Use Authorization (EUA). This EUA will remain  in effect (meaning this test can be used) for the duration of the COVID-19 declaration under Section 564(b)(1) of the Act, 21 U.S.C.section 360bbb-3(b)(1), unless the authorization is terminated  or revoked sooner.       Influenza A by PCR NEGATIVE NEGATIVE Final   Influenza B by PCR NEGATIVE NEGATIVE Final    Comment: (NOTE) The Xpert Xpress SARS-CoV-2/FLU/RSV plus assay is intended as an aid in the diagnosis of influenza from Nasopharyngeal swab specimens and should not be used as a sole basis for treatment. Nasal washings and aspirates are unacceptable for Xpert Xpress SARS-CoV-2/FLU/RSV testing.  Fact Sheet for Patients: EntrepreneurPulse.com.au  Fact Sheet for Healthcare Providers: IncredibleEmployment.be  This test is not yet approved or cleared by the [place] FDA and has been authorized for detection and/or diagnosis of SARS-CoV-2 by FDA under an Emergency Use Authorization (EUA). This EUA will remain in effect (meaning this test can be used) for the duration of the COVID-19 declaration under Section 564(b)(1) of the Act, 21 U.S.C. section 360bbb-3(b)(1), unless the authorization is terminated or revoked.  Performed at [facility], [place] [address], [place], [place] [address]   Resp Panel by RT-PCR (Flu A&B, Covid) Nasopharyngeal Swab     Status: None   Collection Time: 09/17/20  8:44 PM   Specimen: Nasopharyngeal Swab; Nasopharyngeal(NP) swabs in vial transport medium  Result Value Ref Range Status   SARS Coronavirus 2 by RT PCR NEGATIVE NEGATIVE Final    Comment: (NOTE) SARS-CoV-2 target nucleic acids are NOT DETECTED.  The SARS-CoV-2 RNA is generally detectable in  upper respiratory specimens during the acute phase of infection. The lowest concentration of SARS-CoV-2 viral copies this assay can detect is 138 copies/mL. A negative result does not preclude SARS-Cov-2 infection and should not be used as the sole basis for treatment or other patient management decisions. A negative result may occur with  improper specimen collection/handling, submission of specimen other than nasopharyngeal swab, presence of viral mutation(s) within the areas targeted by this assay, and inadequate number of viral copies(<138 copies/mL). A negative result must be combined with clinical observations, patient history, and epidemiological information. The expected result is Negative.  Fact Sheet for Patients:  EntrepreneurPulse.com.au  Fact Sheet for Healthcare Providers:  IncredibleEmployment.be  This test is no t yet approved or cleared by the [place] FDA and  has been authorized for detection and/or diagnosis of SARS-CoV-2 by FDA under an Emergency Use Authorization (EUA). This EUA will remain  in effect (meaning this test can be used) for the duration of the COVID-19 declaration under Section 564(b)(1) of the Act, 21 U.S.C.section 360bbb-3(b)(1), unless the authorization is terminated  or revoked sooner.       Influenza A by PCR NEGATIVE NEGATIVE Final   Influenza B by PCR NEGATIVE NEGATIVE Final    Comment: (NOTE) The Xpert Xpress SARS-CoV-2/FLU/RSV plus assay is intended as an aid in the diagnosis of influenza from Nasopharyngeal swab specimens and should not be used as a sole basis for treatment. Nasal washings and aspirates are unacceptable for Xpert Xpress SARS-CoV-2/FLU/RSV testing.  Fact Sheet for Patients: EntrepreneurPulse.com.au  Fact Sheet for Healthcare Providers: IncredibleEmployment.be  This test is not yet approved or cleared by the [place] FDA and has been  authorized for detection and/or diagnosis of SARS-CoV-2 by FDA under an Emergency Use Authorization (EUA). This EUA will remain in effect (meaning this test can be used) for the duration of the COVID-19 declaration under Section 564(b)(1) of the Act, 21 U.S.C. section 360bbb-3(b)(1), unless the authorization is terminated or revoked.  Performed at [facility], [place] [address] [place], [place] [address]       Radiology Studies: DG Chest Port 1 View  Result Date: 09/17/2020 CLINICAL DATA:  Weakness EXAM: PORTABLE CHEST 1 VIEW COMPARISON:  Radiograph 09/13/2020 FINDINGS: Stable cardiomediastinal contours. Some minimal vascular congestion without features of frank edema. No consolidation, pneumothorax, or effusion. Telemetry leads overlie the chest. No acute osseous or soft tissue abnormality. IMPRESSION: Minimal vascular congestion without features of frank edema. No other acute cardiopulmonary abnormality. Electronically Signed   By: Lovena Le M.D.   On: 09/17/2020 22:07      Scheduled Meds: . mometasone-formoterol  2 puff Inhalation BID  . [START ON 09/21/2020] pantoprazole  40 mg Intravenous Q12H   Continuous Infusions: . dextrose 5% lactated ringers 10 mL/hr at 09/18/20 1413  . insulin 1.7 Units/hr (09/18/20 1406)  . lactated ringers Stopped (09/18/20 1406)  . pantoprozole (PROTONIX) infusion 8 mg/hr (09/18/20 1045)     LOS: 1 day      Time spent: 30 minutes   Dessa Phi, DO [facility] 09/18/2020, 3:36 PM   Available via Epic secure chat 7am-7pm After these hours, please refer to coverage provider listed on [email or web address]

## 2020-09-18 NOTE — Progress Notes (Signed)
Patient off unit via bed by transport to go to HD. This RN stopped Insulin IV gtt before 2100 so that it would not be late. Patient alert, oriented to person and place, no distress noted. No complaints at this time. HD RN aware of patient needing labs drawn and one unit of PRBCs transfused. Will continue to monitor.

## 2020-09-18 NOTE — Progress Notes (Signed)
States she is only studying [employer] to understand the religion, but has not fully converted. Patient is okay with receiving blood.

## 2020-09-18 NOTE — Progress Notes (Signed)
   09/18/20 1356  Vitals  Temp 98.6 F (37 C)  Temp Source Oral  BP (!) 136/95  MAP (mmHg) 106  BP Location Right Arm  BP Method Automatic  Patient Position (if appropriate) Lying  Pulse Rate 99  ECG Heart Rate 99  Resp 17  MEWS COLOR  MEWS Score Color Green  Oxygen Therapy  SpO2 100 %  O2 Device Nasal Cannula  Patient Activity (if Appropriate) In bed  Pain Assessment  Pain Scale 0-10  Pain Score 0   Admitted from the ED. :Patient updated on plan of care. Hgb 6.8. Blood to be administered in HD per nephrology.

## 2020-09-18 NOTE — Consult Note (Addendum)
UNASSIGNED PATIENT Reason for Consult: GI bleed. Referring Physician: [facility] hospitalist.  [name] is an 65 y.o. female.  HPI: [name] is a 65 year old black female with multiple medical problems listed below compounded by medical noncompliance, who presented to the emergency room with black tarry stools to the ostomy.  When I saw the patient in her room she was unable to give me much history and most of the history has been procured from the patient's chart.  Apparently melenic stools were noted about 2 days ago and the patient developed progressive fatigue and nausea.  She was in dialysis yesterday and reportedly got very nauseated 30 minutes after dialysis was started and therefore the session was stopped.  Additionally she had some increased shortness of breath.  As large amount of black stools seem to poor to the hospital ostomy patient was brought to the emergency room for further evaluation.  Apparently she had a colostomy created on 07/28/2020 for necrotizing fasciitis, of the mons pubis labia gluteal and perineal regions, requiring multiple debridements.  I cannot find any records of GI evaluation on the patient in the past I do not believe she has had a colonoscopy.  Past Medical History:  Diagnosis Date  . Anemia   . Aortic stenosis    Echo 8/18: mean 13, peak 28, LVOT/AV mean velocity 0.51  . Arthritis   . Asthma    As a child   . Bronchitis   . CAD (coronary artery disease)    a. 09/2016: 50% Ost 1st Mrg stenosis, 50% 2nd Mrg stenosis, 20% Mid-Cx, 95% Prox LAD, 40% mid-LAD, and 10% dist-LAD stenosis. Staged PCI with DES to Prox-LAD.   [name] Chronic combined systolic and diastolic CHF (congestive heart failure) ([place]) 2011   echo 2/18: EF 55-60, normal wall motion, grade 2 diastolic dysfunction, trivial AI // echo 3/18: Septal and apical HK, EF 45-50, normal wall motion, trivial AI, mild LAE, PASP 38 // echo 8/18: EF 60-65, normal wall motion, grade 1 diastolic dysfunction,  calcified aortic valve leaflets, mild aortic stenosis (mean 13, peak 28, LVOT/AV mean velocity 0.51), mild AI, moderate MAC, mild LAE, trivial TR   . Chronic kidney disease    STAGE 4  . Chronic kidney disease on chronic dialysis (HCC)    t, th, sat  . Complication of anesthesia   . Depression   . Diabetes mellitus Dx 1989  . Elevated lipids   . GERD (gastroesophageal reflux disease)   . Gout   . Heart murmur    asymptomatic  . Hepatitis C Dx 2013  . Hypertension Dx 1989  . Infected surgical wound    Lt arm  . Myocardial infarction ([place]) 07/2015  . Obesity   . Pancreatitis 2013  . Pneumonia   . Refusal of blood transfusions as patient is Jehovah's Witness   . Tendinitis   . Tremors of nervous system    LEFT HAND  . Ulcer 2010   Past Surgical History:  Procedure Laterality Date  . A/V FISTULAGRAM Left 04/11/2019   Procedure: A/V FISTULAGRAM;  Surgeon: Katha Cabal, MD;  Location: [place] [facility];  Service: Cardiovascular;  Laterality: Left;  . A/V FISTULAGRAM Left 06/02/2019   Procedure: A/V FISTULAGRAM;  Surgeon: Katha Cabal, MD;  Location: [place] [facility];  Service: Cardiovascular;  Laterality: Left;  . APPLICATION OF WOUND VAC Left 08/14/2017   Procedure: APPLICATION OF WOUND VAC Exchange;  Surgeon: Robert Bellow, MD;  Location: [facility];  Service: General;  Laterality: Left;  . APPLICATION OF WOUND VAC Left 12/21/2018   Procedure: APPLICATION OF WOUND VAC;  Surgeon: Katha Cabal, MD;  Location: [facility];  Service: Vascular;  Laterality: Left;  . AV FISTULA PLACEMENT Left 08/19/2018   Procedure: ARTERIOVENOUS (AV) FISTULA CREATION ( BRACHIOBASILIC );  Surgeon: Katha Cabal, MD;  Location: [facility];  Service: Vascular;  Laterality: Left;  . BASCILIC VEIN TRANSPOSITION Left 11/18/2018   Procedure: BASCILIC VEIN TRANSPOSITION;  Surgeon: Katha Cabal, MD;  Location: [facility];  Service: Vascular;  Laterality: Left;  . CHOLECYSTECTOMY    .  COLONOSCOPY WITH PROPOFOL N/A 02/03/2018   Procedure: COLONOSCOPY WITH PROPOFOL;  Surgeon: Lin Landsman, MD;  Location: [facility];  Service: Gastroenterology;  Laterality: N/A;  . CORONARY ANGIOPLASTY  07/2015   STENT  . CORONARY STENT INTERVENTION N/A 09/18/2016   Procedure: Coronary Stent Intervention;  Surgeon: Troy Sine, MD;  Location: [place] [facility];  Service: Cardiovascular;  Laterality: N/A;  . DIALYSIS/PERMA CATHETER INSERTION N/A 05/10/2018   Procedure: DIALYSIS/PERMA CATHETER INSERTION;  Surgeon: Katha Cabal, MD;  Location: [place] [facility];  Service: Cardiovascular;  Laterality: N/A;  . DRESSING CHANGE UNDER ANESTHESIA Left 08/15/2017   Procedure: exploration of wound for bleeding;  Surgeon: Robert Bellow, MD;  Location: [facility];  Service: General;  Laterality: Left;  . ESOPHAGOGASTRODUODENOSCOPY (EGD) WITH PROPOFOL N/A 02/03/2018   Procedure: ESOPHAGOGASTRODUODENOSCOPY (EGD) WITH PROPOFOL;  Surgeon: Lin Landsman, MD;  Location: [facility] ENDOSCOPY;  Service: Gastroenterology;  Laterality: N/A;  . EYE SURGERY  11/17/2018  . INCISION AND DRAINAGE ABSCESS Left 08/12/2017   Procedure: INCISION AND DRAINAGE ABSCESS;  Surgeon: Robert Bellow, MD;  Location: [facility];  Service: General;  Laterality: Left;  . KNEE ARTHROSCOPY    . LEFT HEART CATH N/A 09/18/2016   Procedure: Left Heart Cath;  Surgeon: Troy Sine, MD;  Location: [place] [facility];  Service: Cardiovascular;  Laterality: N/A;  . LEFT HEART CATH AND CORONARY ANGIOGRAPHY N/A 09/16/2016   Procedure: Left Heart Cath and Coronary Angiography;  Surgeon: Burnell Blanks, MD;  Location: [place] [facility];  Service: Cardiovascular;  Laterality: N/A;  . LEFT HEART CATH AND CORONARY ANGIOGRAPHY N/A 04/29/2017   Procedure: LEFT HEART CATH AND CORONARY ANGIOGRAPHY;  Surgeon: Nelva Bush, MD;  Location: [place] [facility];  Service: Cardiovascular;  Laterality: N/A;  . LOWER EXTREMITY  ANGIOGRAPHY Right 03/08/2018   Procedure: LOWER EXTREMITY ANGIOGRAPHY;  Surgeon: Katha Cabal, MD;  Location: [place] [facility];  Service: Cardiovascular;  Laterality: Right;  . TUBAL LIGATION    . TUBAL LIGATION    . UPPER EXTREMITY ANGIOGRAPHY Right 09/19/2019   Procedure: UPPER EXTREMITY ANGIOGRAPHY;  Surgeon: Katha Cabal, MD;  Location: [place] [facility];  Service: Cardiovascular;  Laterality: Right;  . WOUND DEBRIDEMENT Left 12/21/2018   Procedure: DEBRIDEMENT WOUND;  Surgeon: Katha Cabal, MD;  Location: [facility];  Service: Vascular;  Laterality: Left;  . WOUND DEBRIDEMENT Left 12/30/2018   Procedure: DEBRIDEMENT WOUND WITH VAC PLACEMENT (LEFT UPPER EXTREMITY);  Surgeon: Katha Cabal, MD;  Location: [facility];  Service: Vascular;  Laterality: Left;   Family History  Problem Relation Age of Onset  . Colon cancer Mother   . Heart attack Other   . Heart attack Maternal Grandmother   . Hypertension Sister   . Hypertension Brother   . Diabetes Paternal Grandmother   . Breast cancer Neg Hx  Social History:  reports that she quit smoking about 39 years ago. Her smoking use included cigarettes. She has a 1.50 pack-year smoking history. She has never used smokeless tobacco. She reports current alcohol use. She reports previous drug use. Drug: Marijuana.  Allergies:  Allergies  Allergen Reactions  . Morphine Itching    Tolerated hydromorphone on 07/2020  . Shellfish Allergy Anaphylaxis and Swelling  . Diazepam Other (See Comments)    "felt like out of body experience"   Medications: I have reviewed the patient's current medications.  Results for orders placed or performed during the hospital encounter of 09/17/20 (from the past 48 hour(s))  Comprehensive metabolic panel     Status: Abnormal   Collection Time: 09/17/20  8:20 PM  Result Value Ref Range   Sodium 128 (L) 135 - 145 mmol/L   Potassium 5.3 (H) 3.5 - 5.1 mmol/L   Chloride 92 (L) 98 - 111 mmol/L    CO2 25 22 - 32 mmol/L   Glucose, Bld 693 (HH) 70 - 99 mg/dL    Comment: Glucose reference range applies only to samples taken after fasting for at least 8 hours. CRITICAL RESULT CALLED TO, READ BACK BY AND VERIFIED WITH: Hale Drone RN [phone] M GARRETT    BUN 55 (H) 8 - 23 mg/dL   Creatinine, Ser 4.51 (H) 0.44 - 1.00 mg/dL   Calcium 7.6 (L) 8.9 - 10.3 mg/dL   Total Protein 5.6 (L) 6.5 - 8.1 g/dL   Albumin 2.5 (L) 3.5 - 5.0 g/dL   AST 46 (H) 15 - 41 U/L   ALT 36 0 - 44 U/L   Alkaline Phosphatase 326 (H) 38 - 126 U/L   Total Bilirubin 0.7 0.3 - 1.2 mg/dL   GFR, Estimated 10 (L) >60 mL/min    Comment: (NOTE) Calculated using the CKD-EPI Creatinine Equation (2021)    Anion gap 11 5 - 15    Comment: Performed at [facility], [place] [address] [place], [place] [address]  Lipase, blood     Status: Abnormal   Collection Time: 09/17/20  8:20 PM  Result Value Ref Range   Lipase 60 (H) 11 - 51 U/L    Comment: Performed at [place] [address] [place], [place] [address]  CBC with Differential     Status: Abnormal   Collection Time: 09/17/20  8:20 PM  Result Value Ref Range   WBC 8.1 4.0 - 10.5 K/uL   RBC 2.01 (L) 3.87 - 5.11 MIL/uL   Hemoglobin 6.4 (LL) 12.0 - 15.0 g/dL    Comment: This critical result has verified and been called to DMarland Kitchen RN by Leanord Hawking on 03 08 2022 at 2127, and has been read back.    HCT 20.1 (L) 36.0 - 46.0 %   MCV 100.0 80.0 - 100.0 fL   MCH 31.8 26.0 - 34.0 pg   MCHC 31.8 30.0 - 36.0 g/dL   RDW 14.6 11.5 - 15.5 %   Platelets 237 150 - 400 K/uL   nRBC 0.0 0.0 - 0.2 %   Neutrophils Relative % 54 %   Neutro Abs 4.4 1.7 - 7.7 K/uL   Lymphocytes Relative 34 %   Lymphs Abs 2.8 0.7 - 4.0 K/uL   Monocytes Relative 8 %   Monocytes Absolute 0.7 0.1 - 1.0 K/uL   Eosinophils Relative 2 %   Eosinophils Absolute 0.1 0.0 - 0.5 K/uL   Basophils Relative 0 %   Basophils  Absolute 0.0 0.0 - 0.1 K/uL   Immature Granulocytes 2 %   Abs  Immature Granulocytes 0.12 (H) 0.00 - 0.07 K/uL    Comment: Performed at [facility], [place] [address] [place], [place] [address]  Protime-INR     Status: None   Collection Time: 09/17/20  8:20 PM  Result Value Ref Range   Prothrombin Time 13.3 11.4 - 15.2 seconds   INR 1.1 0.8 - 1.2    Comment: (NOTE) INR goal varies based on device and disease states. Performed at [facility], [place] [address], [place], [place] [address]   CBG monitoring, ED     Status: Abnormal   Collection Time: 09/17/20  8:40 PM  Result Value Ref Range   Glucose-Capillary >600 (HH) 70 - 99 mg/dL    Comment: Glucose reference range applies only to samples taken after fasting for at least 8 hours.  Resp Panel by RT-PCR (Flu A&B, Covid) Nasopharyngeal Swab     Status: None   Collection Time: 09/17/20  8:44 PM   Specimen: Nasopharyngeal Swab; Nasopharyngeal(NP) swabs in vial transport medium  Result Value Ref Range   SARS Coronavirus 2 by RT PCR NEGATIVE NEGATIVE    Comment: (NOTE) SARS-CoV-2 target nucleic acids are NOT DETECTED.  The SARS-CoV-2 RNA is generally detectable in upper respiratory specimens during the acute phase of infection. The lowest concentration of SARS-CoV-2 viral copies this assay can detect is 138 copies/mL. A negative result does not preclude SARS-Cov-2 infection and should not be used as the sole basis for treatment or other patient management decisions. A negative result may occur with  improper specimen collection/handling, submission of specimen other than nasopharyngeal swab, presence of viral mutation(s) within the areas targeted by this assay, and inadequate number of viral copies(<138 copies/mL). A negative result must be combined with clinical observations, patient history, and epidemiological information. The expected result is Negative.  Fact Sheet for Patients:  EntrepreneurPulse.com.au  Fact Sheet for Healthcare Providers:   IncredibleEmployment.be  This test is no t yet approved or cleared by the [place] FDA and  has been authorized for detection and/or diagnosis of SARS-CoV-2 by FDA under an Emergency Use Authorization (EUA). This EUA will remain  in effect (meaning this test can be used) for the duration of the COVID-19 declaration under Section 564(b)(1) of the Act, 21 U.S.C.section 360bbb-3(b)(1), unless the authorization is terminated  or revoked sooner.       Influenza A by PCR NEGATIVE NEGATIVE   Influenza B by PCR NEGATIVE NEGATIVE    Comment: (NOTE) The Xpert Xpress SARS-CoV-2/FLU/RSV plus assay is intended as an aid in the diagnosis of influenza from Nasopharyngeal swab specimens and should not be used as a sole basis for treatment. Nasal washings and aspirates are unacceptable for Xpert Xpress SARS-CoV-2/FLU/RSV testing.  Fact Sheet for Patients: EntrepreneurPulse.com.au  Fact Sheet for Healthcare Providers: IncredibleEmployment.be  This test is not yet approved or cleared by the [place] FDA and has been authorized for detection and/or diagnosis of SARS-CoV-2 by FDA under an Emergency Use Authorization (EUA). This EUA will remain in effect (meaning this test can be used) for the duration of the COVID-19 declaration under Section 564(b)(1) of the Act, 21 U.S.C. section 360bbb-3(b)(1), unless the authorization is terminated or revoked.  Performed at [facility], [place] [address], [place], [place] [address]   Type and screen [place]     Status: None (Preliminary result)   Collection Time: 09/17/20  9:00 PM  Result Value Ref Range   ABO/RH(D) A POS    Antibody Screen NEG    Sample Expiration 09/20/2020,2359    Unit Number [id number]    Blood Component Type RED CELLS,LR    Unit division 00    Status of Unit ISSUED    Transfusion Status OK TO TRANSFUSE    Crossmatch Result       Compatible Performed at [facility], [place] [address], [place], [place] [address]   Lactic acid, plasma     Status: Abnormal   Collection Time: 09/17/20  9:10 PM  Result Value Ref Range   Lactic Acid, Venous 2.6 (HH) 0.5 - 1.9 mmol/L    Comment: CRITICAL RESULT CALLED TO, READ BACK BY AND VERIFIED WITH: Hale Drone RN [phone] Sander Radon Performed at [facility], [address], [place], [place] [address]   POC occult blood, ED     Status: Abnormal   Collection Time: 09/17/20  9:19 PM  Result Value Ref Range   Fecal Occult Bld POSITIVE (A) NEGATIVE  Prepare RBC (crossmatch)     Status: None   Collection Time: 09/17/20  9:45 PM  Result Value Ref Range   Order Confirmation      ORDER PROCESSED BY BLOOD BANK Performed at [facility], [place] [address], [place], [place] [address]   CBG monitoring, ED     Status: Abnormal   Collection Time: 09/18/20 12:05 AM  Result Value Ref Range   Glucose-Capillary 584 (HH) 70 - 99 mg/dL    Comment: Glucose reference range applies only to samples taken after fasting for at least 8 hours.   Comment 1 Notify RN   CBG monitoring, ED     Status: Abnormal   Collection Time: 09/18/20  4:27 AM  Result Value Ref Range   Glucose-Capillary >600 (HH) 70 - 99 mg/dL    Comment: Glucose reference range applies only to samples taken after fasting for at least 8 hours.  CBG monitoring, ED     Status: Abnormal   Collection Time: 09/18/20  5:25 AM  Result Value Ref Range   Glucose-Capillary 590 (HH) 70 - 99 mg/dL    Comment: Glucose reference range applies only to samples taken after fasting for at least 8 hours.   Comment 1 Document in Chart   Lactic acid, plasma     Status: Abnormal   Collection Time: 09/18/20  5:44 AM  Result Value Ref Range   Lactic Acid, Venous 3.2 (HH) 0.5 - 1.9 mmol/L    Comment: CRITICAL VALUE NOTED.  VALUE IS CONSISTENT WITH PREVIOUSLY REPORTED AND CALLED VALUE. Performed at [facility], [place] [address], [place], [place] [address]   Basic metabolic panel     Status: Abnormal   Collection Time: 09/18/20  5:44 AM  Result Value Ref Range   Sodium 130 (L) 135 - 145 mmol/L   Potassium 5.7 (H) 3.5 - 5.1 mmol/L   Chloride 94 (L) 98 - 111 mmol/L   CO2 22 22 - 32 mmol/L   Glucose, Bld 691 (HH) 70 - 99 mg/dL    Comment: Glucose reference range applies only to samples taken after fasting for at least 8 hours. CRITICAL RESULT CALLED TO, READ BACK BY AND VERIFIED WITH: Hale Drone RN [id number] [phone] M GARRETT    BUN 72 (H) 8 - 23 mg/dL   Creatinine, Ser 5.14 (H) 0.44 - 1.00 mg/dL   Calcium 7.8 (L) 8.9 -  10.3 mg/dL   GFR, Estimated 9 (L) >60 mL/min    Comment: (NOTE) Calculated using the CKD-EPI Creatinine Equation (2021)    Anion gap 14 5 - 15    Comment: Performed at [facility], [place] [address] [place], [place] [address]  Osmolality     Status: Abnormal   Collection Time: 09/18/20  5:44 AM  Result Value Ref Range   Osmolality 340 (HH) 275 - 295 mOsm/kg    Comment: REPEATED TO VERIFY CRITICAL RESULT CALLED TO, READ BACK BY AND VERIFIED WITH: TATE,G RN [phone] 09/18/2020 MITCHELL,L Performed at [facility], [place] [address] [place], [place] [address]   CBG monitoring, ED     Status: Abnormal   Collection Time: 09/18/20  5:58 AM  Result Value Ref Range   Glucose-Capillary 592 (HH) 70 - 99 mg/dL    Comment: Glucose reference range applies only to samples taken after fasting for at least 8 hours.   Comment 1 Document in Chart   CBG monitoring, ED     Status: Abnormal   Collection Time: 09/18/20  6:40 AM  Result Value Ref Range   Glucose-Capillary >600 (HH) 70 - 99 mg/dL    Comment: Glucose reference range applies only to samples taken after fasting for at least 8 hours.  CBG monitoring, ED     Status: Abnormal   Collection Time: 09/18/20  7:42 AM  Result Value Ref Range   Glucose-Capillary 508 (HH) 70 - 99 mg/dL    Comment: Glucose reference range applies only to samples taken  after fasting for at least 8 hours.   Comment 1 Notify RN    Comment 2 Document in Chart   CBG monitoring, ED     Status: Abnormal   Collection Time: 09/18/20  8:19 AM  Result Value Ref Range   Glucose-Capillary 433 (H) 70 - 99 mg/dL    Comment: Glucose reference range applies only to samples taken after fasting for at least 8 hours.   *Note: Due to a large number of results and/or encounters for the requested time period, some results have not been displayed. A complete set of results can be found in Results Review.   DG Chest Port 1 View  Result Date: 09/17/2020 CLINICAL DATA:  Weakness EXAM: PORTABLE CHEST 1 VIEW COMPARISON:  Radiograph 09/13/2020 FINDINGS: Stable cardiomediastinal contours. Some minimal vascular congestion without features of frank edema. No consolidation, pneumothorax, or effusion. Telemetry leads overlie the chest. No acute osseous or soft tissue abnormality. IMPRESSION: Minimal vascular congestion without features of frank edema. No other acute cardiopulmonary abnormality. Electronically Signed   By: Lovena Le M.D.   On: 09/17/2020 22:07   Review of Systems  Unable to perform ROS: Mental status change   Blood pressure (!) 122/48, pulse 100, temperature 97.7 F (36.5 C), temperature source Oral, resp. rate 15, height _0  (1.727 m), weight 126 kg, SpO2 99 %. Physical Exam Constitutional:      General: She is in acute distress.     Appearance: She is obese. She is ill-appearing.  HENT:     Head: Normocephalic.  Eyes:     Pupils: Pupils are equal, round, and reactive to light.  Cardiovascular:     Rate and Rhythm: Normal rate and regular rhythm.     Heart sounds: Normal heart sounds.  Pulmonary:     Effort: Pulmonary effort is normal.     Breath sounds: Normal breath sounds.  Abdominal:     General: A  surgical scar is present. Bowel sounds are normal.     Palpations: There is no fluid wave or splenomegaly.     Tenderness: There is no abdominal tenderness.      Comments: An ostomy bag was noted in the left lower quadrant with large amount of black stool in it  Skin:    General: Skin is warm and dry.  Neurological:     Mental Status: She is disoriented.  Psychiatric:        Mood and Affect: Mood is anxious.   Assessment/Plan: 1) Melenic stools with anemia hemoglobin of 6.4 g/dL on admission.  Plans are to proceed with a EGD tomorrow.  If that is unrevealing and colonoscopy to the ostomy can be considered. 2) Uncontrolled AODM with hyperosmolar hyperglycemia. 3) End-stage renal disease on hemodialysis 3 times a week. 4) COPD. 5) CAD. 6) History of necrotizing fasciitis of the groin gluteal periperineal area requiring multiple debridements and a colostomy. 7) GERD on PPI's. 8) Morbid obesity. Juanita Craver 09/18/2020, 8:36 AM

## 2020-09-18 NOTE — ED Notes (Signed)
Per admitting provider, Opyd MD, okay not to obtain labs ordered until after blood finishes.

## 2020-09-18 NOTE — ED Notes (Signed)
Patient complaining of feeling hot inside and out. This RN paused the blood at this time. Provider OPYD MD aware and now at bedside. Per provider okay to restart the blood. States she was saying this before blood was administered.  This RN will continue to monitor the patient at this time.

## 2020-09-18 NOTE — Progress Notes (Signed)
Carola Rhine, RN called and informed HD was being cancelled till 09/19/20 because she is Jehovah's Witness and cannot receive blood. RN asked the pt if she was Jehovah's and pt stated she was just studying with the Jehovah's Witness to understand but she was not fully converted. Pt stated she wants to get the blood in HD. Juanell Fairly, NP called and made aware.

## 2020-09-18 NOTE — Progress Notes (Signed)
Inpatient Diabetes Program Recommendations  AACE/ADA: New Consensus Statement on Inpatient Glycemic Control (2015)  Target Ranges:  Prepandial:   less than 140 mg/dL      Peak postprandial:   less than 180 mg/dL (1-2 hours)      Critically ill patients:  140 - 180 mg/dL   Results for ASHLEAH, VALTIERRA (MRN [id number]) as of 09/18/2020 10:50  Ref. Range 09/17/2020 20:20  Glucose Latest Ref Range: 70 - 99 mg/dL [facility])   Results for SYMONE, CORNMAN (MRN [id number]) as of 09/18/2020 10:50  Ref. Range 09/17/2020 20:40 09/18/2020 00:05 09/18/2020 04:27 09/18/2020 05:25 09/18/2020 05:58 09/18/2020 06:40 09/18/2020 07:42 09/18/2020 08:19 09/18/2020 09:01 09/18/2020 09:54 09/18/2020 10:42  Glucose-Capillary Latest Ref Range: 70 - 99 mg/dL >600 (HH) 584 (HH) >600 (HH)  IV Insulin Drip Started 590 (HH)  IV Insulin Drip 592 (HH)  IV Insulin Drip >600 (HH)  IV Insulin Drip 508 (HH)  IV Insulin Drip 433 (H)  IV Insulin Drip 448 (H)  IV Insulin Drip 409 (H)  IV Insulin Drip 392 (H)  IV Insulin Drip   Results for LAGINA, READER (MRN [id number]) as of 09/18/2020 10:50  Ref. Range 09/13/2020 05:56  Hemoglobin A1C Latest Ref Range: 4.8 - 5.6 % 8.4 (H)     Admit with: Upper GI bleeding; symptomatic anemia/ Hyperosmolar hyperglycemic state  Just here 03/04 through 03/05 for admission for  acute pulmonary edema/fluid overload due to missing hemodialysis/ hypertensive urgency which was also secondary to missing hemodialysis/ Hyperglycemia  History: DM, ESRD, CHF  Home DM Meds: Levemir 60 units BID       Humalog 7 units BID       Victoza 1.8 mg Daily       Per Chart Review, pt is supposed to be taking Levemir 60 units BID + Humalog 25 units TID + Victoza 1.8 mg Daily (Office Visit 12/717/2021 [place])  Current Orders: IV Insulin Drip    Would leave on the IV Insulin Drip until CBGs stable below 180 mg/dl for at least 4 readings  When ready to transition to SQ Insulin, please consider: --Start Levemir  30 units BID (50% home dose to start) Make sure to start Levemir at least 1 hour prior to d/c of the IV Insulin Drip --Start Novolog Sensitive Correction Scale/ SSI (0-9 units) Q4 hours    --Will follow patient during hospitalization--  Wyn Quaker RN, MSN, CDE Diabetes Coordinator Inpatient Glycemic Control Team Team Pager: [phone] (8a-5p)

## 2020-09-18 NOTE — Telephone Encounter (Signed)
Speech was changed.

## 2020-09-19 ENCOUNTER — Encounter (HOSPITAL_COMMUNITY): Payer: Self-pay | Admitting: Family Medicine

## 2020-09-19 ENCOUNTER — Encounter (HOSPITAL_COMMUNITY)
Admission: EM | Disposition: A | Discharge status: Home Health | Payer: Self-pay | Source: Home / Self Care | Attending: Internal Medicine

## 2020-09-19 DIAGNOSIS — I10 Essential (primary) hypertension: Secondary | ICD-10-CM | POA: Diagnosis not present

## 2020-09-19 DIAGNOSIS — N186 End stage renal disease: Secondary | ICD-10-CM | POA: Diagnosis not present

## 2020-09-19 DIAGNOSIS — I2510 Atherosclerotic heart disease of native coronary artery without angina pectoris: Secondary | ICD-10-CM | POA: Diagnosis not present

## 2020-09-19 DIAGNOSIS — K922 Gastrointestinal hemorrhage, unspecified: Secondary | ICD-10-CM | POA: Diagnosis not present

## 2020-09-19 LAB — CBC
HCT: 25 % — ABNORMAL LOW (ref 36.0–46.0)
HCT: 19.4 % — ABNORMAL LOW (ref 36.0–46.0)
Hemoglobin: 8.3 g/dL — ABNORMAL LOW (ref 12.0–15.0)
Hemoglobin: 6.3 g/dL — CL (ref 12.0–15.0)
MCH: 30.3 pg (ref 26.0–34.0)
MCH: 30 pg (ref 26.0–34.0)
MCHC: 33.2 g/dL (ref 30.0–36.0)
MCHC: 32.5 g/dL (ref 30.0–36.0)
MCV: 91.2 fL (ref 80.0–100.0)
MCV: 92.4 fL (ref 80.0–100.0)
Platelets: 220 10*3/uL (ref 150–400)
Platelets: 188 10*3/uL (ref 150–400)
RBC: 2.74 MIL/uL — ABNORMAL LOW (ref 3.87–5.11)
RBC: 2.1 MIL/uL — ABNORMAL LOW (ref 3.87–5.11)
RDW: 18.8 % — ABNORMAL HIGH (ref 11.5–15.5)
RDW: 19.1 % — ABNORMAL HIGH (ref 11.5–15.5)
WBC: 13.2 10*3/uL — ABNORMAL HIGH (ref 4.0–10.5)
WBC: 11.4 10*3/uL — ABNORMAL HIGH (ref 4.0–10.5)
nRBC: 0.4 % — ABNORMAL HIGH (ref 0.0–0.2)
nRBC: 0.5 % — ABNORMAL HIGH (ref 0.0–0.2)

## 2020-09-19 LAB — BASIC METABOLIC PANEL
Anion gap: 12 (ref 5–15)
BUN: 47 mg/dL — ABNORMAL HIGH (ref 8–23)
CO2: 25 mmol/L (ref 22–32)
Calcium: 8.2 mg/dL — ABNORMAL LOW (ref 8.9–10.3)
Chloride: 100 mmol/L (ref 98–111)
Creatinine, Ser: 4.57 mg/dL — ABNORMAL HIGH (ref 0.44–1.00)
GFR, Estimated: 10 mL/min — ABNORMAL LOW (ref >60)
Glucose, Bld: 215 mg/dL — ABNORMAL HIGH (ref 70–99)
Potassium: 4.6 mmol/L (ref 3.5–5.1)
Sodium: 137 mmol/L (ref 135–145)

## 2020-09-19 LAB — RENAL FUNCTION PANEL
Albumin: 2.5 g/dL — ABNORMAL LOW (ref 3.5–5.0)
Anion gap: 14 (ref 5–15)
BUN: 48 mg/dL — ABNORMAL HIGH (ref 8–23)
CO2: 24 mmol/L (ref 22–32)
Calcium: 8.5 mg/dL — ABNORMAL LOW (ref 8.9–10.3)
Chloride: 99 mmol/L (ref 98–111)
Creatinine, Ser: 4.33 mg/dL — ABNORMAL HIGH (ref 0.44–1.00)
GFR, Estimated: 11 mL/min — ABNORMAL LOW (ref >60)
Glucose, Bld: 177 mg/dL — ABNORMAL HIGH (ref 70–99)
Phosphorus: 3.6 mg/dL (ref 2.5–4.6)
Potassium: 4.6 mmol/L (ref 3.5–5.1)
Sodium: 137 mmol/L (ref 135–145)

## 2020-09-19 LAB — GLUCOSE, CAPILLARY
Glucose-Capillary: 172 mg/dL — ABNORMAL HIGH (ref 70–99)
Glucose-Capillary: 204 mg/dL — ABNORMAL HIGH (ref 70–99)
Glucose-Capillary: 222 mg/dL — ABNORMAL HIGH (ref 70–99)
Glucose-Capillary: 221 mg/dL — ABNORMAL HIGH (ref 70–99)
Glucose-Capillary: 240 mg/dL — ABNORMAL HIGH (ref 70–99)

## 2020-09-19 LAB — PREPARE RBC (CROSSMATCH)

## 2020-09-19 LAB — HEMOGLOBIN AND HEMATOCRIT, BLOOD
HCT: 22.5 % — ABNORMAL LOW (ref 36.0–46.0)
Hemoglobin: 7.2 g/dL — ABNORMAL LOW (ref 12.0–15.0)

## 2020-09-19 LAB — MRSA PCR SCREENING: MRSA by PCR: POSITIVE — AB

## 2020-09-19 SURGERY — CANCELLED PROCEDURE

## 2020-09-19 MED ORDER — MIDAZOLAM HCL (PF) 5 MG/ML IJ SOLN
INTRAMUSCULAR | Status: AC
  Filled 2020-09-19: qty 2

## 2020-09-19 MED ORDER — ACETAMINOPHEN 325 MG PO TABS
650.0000 mg | ORAL_TABLET | Freq: Once | ORAL | Status: AC
  Administered 2020-09-19: 650 mg via ORAL
  Filled 2020-09-19: qty 2

## 2020-09-19 MED ORDER — FENTANYL CITRATE (PF) 100 MCG/2ML IJ SOLN
INTRAMUSCULAR | Status: AC
  Filled 2020-09-19: qty 4

## 2020-09-19 MED ORDER — DIPHENHYDRAMINE HCL 50 MG/ML IJ SOLN
25.0000 mg | Freq: Once | INTRAMUSCULAR | Status: AC
  Administered 2020-09-19: 25 mg via INTRAVENOUS
  Filled 2020-09-19: qty 1

## 2020-09-19 MED ORDER — SODIUM CHLORIDE 0.9% IV SOLUTION: Freq: Once | INTRAVENOUS | Status: AC

## 2020-09-19 MED ORDER — DIPHENHYDRAMINE HCL 50 MG/ML IJ SOLN
INTRAMUSCULAR | Status: AC
  Filled 2020-09-19: qty 1

## 2020-09-19 MED ORDER — MUPIROCIN 2 % EX OINT
1.0000 "application " | TOPICAL_OINTMENT | Freq: Two times a day (BID) | CUTANEOUS | Status: AC
  Administered 2020-09-19 – 2020-09-23 (×7): 1 via NASAL
  Filled 2020-09-19 (×2): qty 22

## 2020-09-19 NOTE — Progress Notes (Signed)
[facility] Progress Note   Subjective: Seen in room. HD and transfusion of 1 unit PRBCs. HGB rose to 8.3 post transfusion but fell to 6.3 this AM. Going to endoscopy this PM.  HD later this evening.   Discussed blood transfusion with patient for clarification as she was listed as a Jehovah Witness in EPIC. She says she "studies [place]" but IS NOT A Jehovah Witness and wishes to receive blood.   Objective Vitals:   09/19/20 0004 09/19/20 0430 09/19/20 0722 09/19/20 1200  BP:  (!) 110/52 (!) 122/41 (!) 130/40  Pulse: 99 100 98 97  Resp: 12 17 14 16   Temp:  99.1 F (37.3 C) 99.1 F (37.3 C) 99.1 F (37.3 C)  TempSrc:  Oral Oral Oral  SpO2: 99% 100% 100% 100%  Weight:      Height:       Physical Exam General: ill appearing obese female in NAD HEENT: NCAT, mucous membranes dry, neck supple.  Heart: S1,S2, RRR. No M/R/G. Slight JVD at 30 degrees.  Lungs: Slightly decreased in bases otherwise CTAB. No WOB.  Abdomen: Obese, NT, active BS. Ostomy LLQ of abdomen with large maroon stool.  Extremities: trace to 1+ BLE pitting edema.  Dialysis Access: L AVF + bruit.    Additional Objective Labs: Basic Metabolic Panel: Recent Labs  Lab 09/14/20 0206 09/17/20 2020 09/18/20 1900 09/19/20 0344 09/19/20 0713  NA 130*   < > 135 137 137  K 4.2   < > 4.3 4.6 4.6  CL 93*   < > 98 99 100  CO2 27   < > 25 24 25   GLUCOSE 392*   < > 145* 177* 215*  BUN 17   < > 81* 48* 47*  CREATININE 4.07*   < > 6.08* 4.33* 4.57*  CALCIUM 8.4*   < > 8.4* 8.5* 8.2*  PHOS 3.8  --   --  3.6  --    < > = values in this interval not displayed.   Liver Function Tests: Recent Labs  Lab 09/13/20 0445 09/14/20 0206 09/17/20 2020 09/19/20 0344  AST 42*  --  46*  --   ALT 39  --  36  --   ALKPHOS 359*  --  326*  --   BILITOT 0.6  --  0.7  --   PROT 6.5  --  5.6*  --   ALBUMIN 2.9* 2.7* 2.5* 2.5*   Recent Labs  Lab 09/17/20 2020  LIPASE 60*   CBC: Recent Labs   Lab 09/13/20 0445 09/13/20 0512 09/14/20 0206 09/17/20 2020 09/18/20 1046 09/19/20 0344 09/19/20 1119  WBC 4.4  --  5.3 8.1 10.7* 13.2* 11.4*  NEUTROABS 2.7  --   --  4.4  --   --   --   HGB 9.1*   < > 9.4* 6.4* 6.8* 8.3* 6.3*  HCT 29.4*   < > 28.4* 20.1* 20.6* 25.0* 19.4*  MCV 101.7*  --  97.9 100.0 94.5 91.2 92.4  PLT 136*  --  141* 237 262 220 188   < > = values in this interval not displayed.   Blood Culture    Component Value Date/Time   SDES BLOOD RIGHT FA 12/28/2018 2315   SPECREQUEST  12/28/2018 2315    BOTTLES DRAWN AEROBIC AND ANAEROBIC Blood Culture adequate volume   CULT  12/28/2018 2315    NO GROWTH 5 DAYS Performed at [facility], [address] [place], [place] [address]  REPTSTATUS 01/02/2019 FINAL 12/28/2018 2315    Cardiac Enzymes: No results for input(s): CKTOTAL, CKMB, CKMBINDEX, TROPONINI in the last 168 hours. CBG: Recent Labs  Lab 09/18/20 1949 09/18/20 2349 09/19/20 0416 09/19/20 0726 09/19/20 1206  GLUCAP 140* 146* 172* 204* 222*   Iron Studies: No results for input(s): IRON, TIBC, TRANSFERRIN, FERRITIN in the last 72 hours. @lablastinr3 @ Studies/Results: DG Chest Port 1 View  Result Date: 09/17/2020 CLINICAL DATA:  Weakness EXAM: PORTABLE CHEST 1 VIEW COMPARISON:  Radiograph 09/13/2020 FINDINGS: Stable cardiomediastinal contours. Some minimal vascular congestion without features of frank edema. No consolidation, pneumothorax, or effusion. Telemetry leads overlie the chest. No acute osseous or soft tissue abnormality. IMPRESSION: Minimal vascular congestion without features of frank edema. No other acute cardiopulmonary abnormality. Electronically Signed   By: Lovena Le M.D.   On: 09/17/2020 22:07   Medications: . sodium chloride    . sodium chloride    . dextrose 5% lactated ringers Stopped (09/18/20 2013)  . lactated ringers Stopped (09/18/20 1406)  . pantoprozole (PROTONIX) infusion 8 mg/hr (09/19/20 0700)   . sodium  chloride   Intravenous Once  . acetaminophen  650 mg Oral Once  . Chlorhexidine Gluconate Cloth  6 each Topical Q0600  . diphenhydrAMINE  25 mg Intravenous Once  . insulin aspart  0-9 Units Subcutaneous Q4H  . insulin detemir  30 Units Subcutaneous BID  . mometasone-formoterol  2 puff Inhalation BID  . mupirocin ointment  1 application Nasal BID  . [START ON 09/21/2020] pantoprazole  40 mg Intravenous Q12H     HD orders: [place] T,Th,S 4 hrs 180NRe 400/500 126.5 kg 2.0K/2.0 Ca L AVF -Heparin 6000 units IV TIW (DC upon Discharge) -Hectorol 4 mcg IV TIW -Sensipar 30 mg PO TIW -Mircera 75 mcg IV q 2 weeks (last HGB on DC 09/14/20 9.4)  Assessment/Plan: 1. UGI bleed-HGB 6.4on admit. S/P 2 unit PRBCs, HGB transiently rose to 8.3 early this AM but feel to 6.3. Will tranfuse another unit of PRBCS.  GI has been consulted. Going to Endoscopy this afternoon.  2. HHS-BS 693 CO2 22 AG 14 on admission. Started on insulin per primary. Now resolved.  3. ESRD -T,Th,S. Short HD off schedule last PM,regular HD after endoscopy today. K+ 4.6. DC Heparin with HD.  3. Anemia -See #1.  4. Secondary hyperparathyroidism - C Ca OK. Continue sensipar, VDRA. Resume binders when eating.  5. HTN/volume -CXR with mild pulmonary congestion on admit. Still with trace ankle edema. HD last PM-Net UF no UF!!! Now 4.5 kg above OP EDW.  6. Nutrition - NPO at present. Low Albumin. Renal/Carb mod diet. Protein supps when able to eat.  7. H/O CAD 8. H/O Necrotizing fasciitis R groin with diverting ostomy. S/P multiple debridements. Per primary.   [place] H. Kippy Gohman NP-C 09/19/2020, 1:03 PM  [employer] [phone]

## 2020-09-19 NOTE — Consult Note (Signed)
WOC Nurse Consult Note: Patient receiving care in [facility] [phone]. Reason for Consult: right inguinal wound care orders Wound type: nearly healed surgical wound, see photo Pressure Injury POA: Yes/No/NA Measurement: na Wound bed: pink Drainage (amount, consistency, odor) scant serous on existing gauze dressing Periwound: intact Dressing procedure/placement/frequency:  Cleanse right inguinal fold wound with soap and water. Pat dry. Fold a strip of Xeroform gauze, Lawson #[phone], and place over wound. Change daily.  [place] Nurse ostomy follow up Stoma type/location: LUQ colostomy Stomal assessment/size: could not visualize through newly placed pouch Peristomal assessment: deferred, supplies must be ordered and obtained. Treatment options for stomal/peristomal skin: barrier ring if needed Output: thin, black Ostomy pouching: 2pc. 4 inch pouching system, Kellie Simmering [phone] Education provided: none; patient received surgery in a different medical system some months ago. Monitor the wound area(s) for worsening of condition such as: Signs/symptoms of infection,  Increase in size,  Development of or worsening of odor, Development of pain, or increased pain at the affected locations.  Notify the medical team if any of these develop.  Thank you for the consult.  Discussed plan of care with the patient and bedside nurse.  [place] nurse will not follow at this time.  Please re-consult the [place] team if needed.  Val Riles, RN, MSN, CWOCN, CNS-BC, pager [phone]

## 2020-09-19 NOTE — Progress Notes (Signed)
PT Cancellation Note  Patient Details Name: [name] MRN: [id number] DOB: 23-Oct-1955   Cancelled Treatment:    Reason Eval/Treat Not Completed: (P) Medical issues which prohibited therapy Pt Hgb 6.3, plan for transfusion, followed by endoscopy and HD. PT will follow back for Evaluation tomorrow.  Elizabeth B. Migdalia Dk PT, DPT Acute Rehabilitation Services Pager [phone] Office [phone]    [place] 09/19/2020, 2:41 PM

## 2020-09-19 NOTE — H&P (View-Only) (Signed)
In the endo unit the patient's diastolic pressure was very low and she only had one IV line for access.  That line was being used for the blood transfusion.  With the significant diastolic hypotension and her anemia, requiring transfusion, it was felt to be safer with postponing her EGD until tomorrow.  The goal is to optimize her clinical status for the procedure.

## 2020-09-19 NOTE — Progress Notes (Signed)
In the endo unit the patient's diastolic pressure was very low and she only had one IV line for access.  That line was being used for the blood transfusion.  With the significant diastolic hypotension and her anemia, requiring transfusion, it was felt to be safer with postponing her EGD until tomorrow.  The goal is to optimize her clinical status for the procedure.

## 2020-09-19 NOTE — Plan of Care (Signed)
  Problem: Education: Goal: Knowledge of General Education information will improve Description: Including pain rating scale, medication(s)/side effects and non-pharmacologic comfort measures Outcome: Progressing   Problem: Health Behavior/Discharge Planning: Goal: Ability to manage health-related needs will improve Outcome: Progressing   Problem: Clinical Measurements: Goal: Ability to maintain clinical measurements within normal limits will improve Outcome: Progressing Goal: Will remain free from infection Outcome: Progressing Goal: Diagnostic test results will improve Outcome: Progressing Goal: Respiratory complications will improve Outcome: Progressing Goal: Cardiovascular complication will be avoided Outcome: Progressing   Problem: Activity: Goal: Risk for activity intolerance will decrease Outcome: Progressing   Problem: Nutrition: Goal: Adequate nutrition will be maintained Outcome: Progressing   Problem: Coping: Goal: Level of anxiety will decrease Outcome: Progressing   Problem: Elimination: Goal: Will not experience complications related to bowel motility Outcome: Progressing Goal: Will not experience complications related to urinary retention Outcome: Progressing   Problem: Pain Managment: Goal: General experience of comfort will improve Outcome: Progressing   Problem: Safety: Goal: Ability to remain free from injury will improve Outcome: Progressing   Problem: Skin Integrity: Goal: Risk for impaired skin integrity will decrease Outcome: Progressing   Problem: Education: Goal: Ability to describe self-care measures that may prevent or decrease complications (Diabetes Survival Skills Education) will improve Outcome: Progressing Goal: Individualized Educational Video(s) Outcome: Progressing   Problem: Cardiac: Goal: Ability to maintain an adequate cardiac output will improve Outcome: Progressing   Problem: Health Behavior/Discharge  Planning: Goal: Ability to identify and utilize available resources and services will improve Outcome: Progressing Goal: Ability to manage health-related needs will improve Outcome: Progressing   Problem: Fluid Volume: Goal: Ability to achieve a balanced intake and output will improve Outcome: Progressing   Problem: Metabolic: Goal: Ability to maintain appropriate glucose levels will improve Outcome: Progressing   Problem: Nutritional: Goal: Maintenance of adequate nutrition will improve Outcome: Progressing Goal: Maintenance of adequate weight for body size and type will improve Outcome: Progressing   Problem: Respiratory: Goal: Will regain and/or maintain adequate ventilation Outcome: Progressing   Problem: Urinary Elimination: Goal: Ability to achieve and maintain adequate renal perfusion and functioning will improve Outcome: Progressing   Problem: Education: Goal: Ability to identify signs and symptoms of gastrointestinal bleeding will improve Outcome: Progressing   Problem: Bowel/Gastric: Goal: Will show no signs and symptoms of gastrointestinal bleeding Outcome: Progressing   Problem: Fluid Volume: Goal: Will show no signs and symptoms of excessive bleeding Outcome: Progressing   Problem: Clinical Measurements: Goal: Complications related to the disease process, condition or treatment will be avoided or minimized Outcome: Progressing   Problem: Education: Goal: Knowledge of disease and its progression will improve Outcome: Progressing Goal: Individualized Educational Video(s) Outcome: Progressing   Problem: Fluid Volume: Goal: Compliance with measures to maintain balanced fluid volume will improve Outcome: Progressing   Problem: Health Behavior/Discharge Planning: Goal: Ability to manage health-related needs will improve Outcome: Progressing   Problem: Nutritional: Goal: Ability to make healthy dietary choices will improve Outcome: Progressing    Problem: Clinical Measurements: Goal: Complications related to the disease process, condition or treatment will be avoided or minimized Outcome: Progressing

## 2020-09-19 NOTE — Progress Notes (Signed)
Pt in Endo for a EGD with Dr. Benson Norway. Pt currently has 1 PIV which has blood currently running through it. Unable to find a place to start 2nd PIV for procedure. Pt is a dialysis pt and her diastolic pressure is currently in the low 40's. Spoke with Dr. Benson Norway who believes it will be better to do this procedure under Monitored Anesthesia Care vs moderate sedation. Pt's procedure cancelled today and rescheduled for 09/20/20 with MAC. Jobe Igo, RN

## 2020-09-19 NOTE — Progress Notes (Signed)
PROGRESS NOTE        PATIENT DETAILS Name: [name] Age: 65 y.o. Sex: female Date of Birth: 12-02-1955 Admit Date: 09/17/2020 Admitting Physician Vianne Bulls, MD HUD:JSHFWYOV, Clearnce Sorrel, PA-C  Brief Narrative: Patient is a 65 y.o. female ESRD, history of peroneal necrotizing fasciitis s/p colostomy January 2022, DM-2, anemia, depression, anxiety-referred from hemodialysis center for evaluation of weakness and black tarry stools in her ostomy.  She was found to have acute blood loss anemia and hyperosmolar hyperglycemic state-she was given 1 unit of PRBC transfusion-started on IV insulin and subsequently transferred to [facility].  Significant events: 3/8>> admit for black stools in ostomy with acute blood loss anemia and hyperosmolar hyperglycemic state requiring IV insulin.  Significant studies: 3/8>> chest x-[place]: No acute abnormality  Antimicrobial therapy: None  Microbiology data: None  Procedures : None  Consults: GI, ESRD  DVT Prophylaxis : SCDs Start: 09/17/20 2246   Subjective: Lying comfortably in bed-black stool still present in ostomy.   Assessment/Plan: Probable upper GI bleeding with acute blood loss anemia: Continues to have black stools in ostomy-continue PPI-hemoglobin stable after 2 units of PRBC transfusion-continue to follow CBC-scheduled for endoscopic evaluation later today.  Will await further recommendations from GI-continue to follow CBC closely   Hyperosmolar hyperglycemic state: Treated with IV insulin-has been transitioned to SQ insulin.  Insulin-dependent DM-2 (A1c 8.4 on 3/4): CBGs relatively stable-continue Levemir 30 units twice daily-SSI-currently n.p.o. for EGD-we will follow and adjust accordingly.  Recent Labs    09/18/20 2349 09/19/20 0416 09/19/20 0726  GLUCAP 146* 172* 204*   ESRD: On HD-nephrology following and directing care  CAD: Without any anginal symptoms-aspirin on hold due to  GI bleeding-resume when able  HTN: BP currently controlled-resume Coreg/hydralazine-once diet resumed post EGD  COPD: Not in exacerbation-continue bronchodilators  History of necrotizing fasciitis involving perineum-s/p multiple debridements-subsequent placement of a colostomy in January 2022  Prolonged QTC: Repeat EKG tomorrow morning.  Morbid Obesity: Estimated body mass index is 43.58 kg/m as calculated from the following:   Height as of this encounter: 5\' 8"  (1.727 m).   Weight as of this encounter: 130 kg.    Diet: Diet Order            Diet NPO time specified Except for: Ice Chips  Diet effective now                  Code Status: Full code   Family Communication: Spoke with daughter-Jazzslyn Mcfarren over [phone]-on 3/10  Disposition Plan: Status is: Inpatient  Remains inpatient appropriate because:Inpatient level of care appropriate due to severity of illness   Dispo: The patient is from: Home              Anticipated d/c is to: Home              Patient currently is not medically stable to d/c.   Difficult to place patient No   Barriers to Discharge: Ongoing upper GI bleeding-awaiting endoscopy evaluation-needing frequent CBC monitoring.  Antimicrobial agents: Anti-infectives (From admission, onward)   None       Time spent: 35 minutes-Greater than 50% of this time was spent in counseling, explanation of diagnosis, planning of further management, and coordination of care.  MEDICATIONS: Scheduled Meds: . Chlorhexidine Gluconate Cloth  6 each Topical Q0600  . insulin aspart  0-9 Units Subcutaneous Q4H  . insulin detemir  30 Units Subcutaneous BID  . mometasone-formoterol  2 puff Inhalation BID  . mupirocin ointment  1 application Nasal BID  . [START ON 09/21/2020] pantoprazole  40 mg Intravenous Q12H   Continuous Infusions: . sodium chloride    . sodium chloride    . dextrose 5% lactated ringers Stopped (09/18/20 2013)  . lactated  ringers Stopped (09/18/20 1406)  . pantoprozole (PROTONIX) infusion 8 mg/hr (09/19/20 0700)   PRN Meds:.sodium chloride, sodium chloride, albuterol, dextrose, lidocaine (PF), lidocaine-prilocaine, pentafluoroprop-tetrafluoroeth   PHYSICAL EXAM: Vital signs: Vitals:   09/19/20 0000 09/19/20 0004 09/19/20 0430 09/19/20 0722  BP: (!) 132/50  (!) 110/52 (!) 122/41  Pulse: 99 99 100 98  Resp: 12 12 17 14   Temp:   99.1 F (37.3 C) 99.1 F (37.3 C)  TempSrc:   Oral Oral  SpO2: (!) 87% 99% 100% 100%  Weight:      Height:       Filed Weights   09/18/20 1356 09/18/20 2035 09/18/20 2305  Weight: 130.3 kg 130 kg 130 kg   Body mass index is 43.58 kg/m.   Gen Exam:Alert awake-not in any distress HEENT:atraumatic, normocephalic Chest: B/L clear to auscultation anteriorly CVS:S1S2 regular Abdomen:soft non tender, non distended Extremities:no edema Neurology: Non focal Skin: no rash  I have personally reviewed following labs and imaging studies  LABORATORY DATA: CBC: Recent Labs  Lab 09/13/20 0445 09/13/20 0512 09/13/20 0537 09/14/20 0206 09/17/20 2020 09/18/20 1046 09/19/20 0344  WBC 4.4  --   --  5.3 8.1 10.7* 13.2*  NEUTROABS 2.7  --   --   --  4.4  --   --   HGB 9.1*   < > 10.5* 9.4* 6.4* 6.8* 8.3*  HCT 29.4*   < > 31.0* 28.4* 20.1* 20.6* 25.0*  MCV 101.7*  --   --  97.9 100.0 94.5 91.2  PLT 136*  --   --  141* 237 262 220   < > = values in this interval not displayed.    Basic Metabolic Panel: Recent Labs  Lab 09/14/20 0206 09/17/20 2020 09/18/20 1046 09/18/20 1404 09/18/20 1900 09/19/20 0344 09/19/20 0713  NA 130*   < > 132* 134* 135 137 137  K 4.2   < > 4.9 4.1 4.3 4.6 4.6  CL 93*   < > 95* 97* 98 99 100  CO2 27   < > 25 25 25 24 25   GLUCOSE 392*   < > 428* 215* 145* 177* 215*  BUN 17   < > 78* 81* 81* 48* 47*  CREATININE 4.07*   < > 5.47* 5.57* 6.08* 4.33* 4.57*  CALCIUM 8.4*   < > 8.3* 8.2* 8.4* 8.5* 8.2*  PHOS 3.8  --   --   --   --  3.6  --    <  > = values in this interval not displayed.    GFR: Estimated Creatinine Clearance: 18.2 mL/min (A) (by C-G formula based on SCr of 4.57 mg/dL (H)).  Liver Function Tests: Recent Labs  Lab 09/13/20 0445 09/14/20 0206 09/17/20 2020 09/19/20 0344  AST 42*  --  46*  --   ALT 39  --  36  --   ALKPHOS 359*  --  326*  --   BILITOT 0.6  --  0.7  --   PROT 6.5  --  5.6*  --   ALBUMIN 2.9* 2.7* 2.5* 2.5*   Recent  Labs  Lab 09/17/20 2020  LIPASE 60*   No results for input(s): AMMONIA in the last 168 hours.  Coagulation Profile: Recent Labs  Lab 09/17/20 2020  INR 1.1    Cardiac Enzymes: No results for input(s): CKTOTAL, CKMB, CKMBINDEX, TROPONINI in the last 168 hours.  BNP (last 3 results) No results for input(s): PROBNP in the last 8760 hours.  Lipid Profile: No results for input(s): CHOL, HDL, LDLCALC, TRIG, CHOLHDL, LDLDIRECT in the last 72 hours.  Thyroid Function Tests: No results for input(s): TSH, T4TOTAL, FREET4, T3FREE, THYROIDAB in the last 72 hours.  Anemia Panel: No results for input(s): VITAMINB12, FOLATE, FERRITIN, TIBC, IRON, RETICCTPCT in the last 72 hours.  Urine analysis:    Component Value Date/Time   COLORURINE AMBER (A) 12/13/2019 1258   APPEARANCEUR CLOUDY (A) 12/13/2019 1258   LABSPEC 1.017 12/13/2019 1258   PHURINE 5.0 12/13/2019 1258   GLUCOSEU NEGATIVE 12/13/2019 1258   HGBUR NEGATIVE 12/13/2019 1258   BILIRUBINUR NEGATIVE 12/13/2019 1258   BILIRUBINUR neg 12/17/2016 1538   KETONESUR NEGATIVE 12/13/2019 1258   PROTEINUR 30 (A) 12/13/2019 1258   UROBILINOGEN 0.2 12/17/2016 1538   UROBILINOGEN 0.2 05/29/2010 2100   NITRITE NEGATIVE 12/13/2019 1258   LEUKOCYTESUR LARGE (A) 12/13/2019 1258    Sepsis Labs: Lactic Acid, Venous    Component Value Date/Time   LATICACIDVEN 3.2 (HH) 09/18/2020 0544    MICROBIOLOGY: Recent Results (from the past 240 hour(s))  Resp Panel by RT-PCR (Flu A&B, Covid) Nasopharyngeal Swab     Status: None    Collection Time: 09/13/20  4:45 AM   Specimen: Nasopharyngeal Swab; Nasopharyngeal(NP) swabs in vial transport medium  Result Value Ref Range Status   SARS Coronavirus 2 by RT PCR NEGATIVE NEGATIVE Final    Comment: (NOTE) SARS-CoV-2 target nucleic acids are NOT DETECTED.  The SARS-CoV-2 RNA is generally detectable in upper respiratory specimens during the acute phase of infection. The lowest concentration of SARS-CoV-2 viral copies this assay can detect is 138 copies/mL. A negative result does not preclude SARS-Cov-2 infection and should not be used as the sole basis for treatment or other patient management decisions. A negative result may occur with  improper specimen collection/handling, submission of specimen other than nasopharyngeal swab, presence of viral mutation(s) within the areas targeted by this assay, and inadequate number of viral copies(<138 copies/mL). A negative result must be combined with clinical observations, patient history, and epidemiological information. The expected result is Negative.  Fact Sheet for Patients:  EntrepreneurPulse.com.au  Fact Sheet for Healthcare Providers:  IncredibleEmployment.be  This test is no t yet approved or cleared by the [place] FDA and  has been authorized for detection and/or diagnosis of SARS-CoV-2 by FDA under an Emergency Use Authorization (EUA). This EUA will remain  in effect (meaning this test can be used) for the duration of the COVID-19 declaration under Section 564(b)(1) of the Act, 21 U.S.C.section 360bbb-3(b)(1), unless the authorization is terminated  or revoked sooner.       Influenza A by PCR NEGATIVE NEGATIVE Final   Influenza B by PCR NEGATIVE NEGATIVE Final    Comment: (NOTE) The Xpert Xpress SARS-CoV-2/FLU/RSV plus assay is intended as an aid in the diagnosis of influenza from Nasopharyngeal swab specimens and should not be used as a sole basis for treatment.  Nasal washings and aspirates are unacceptable for Xpert Xpress SARS-CoV-2/FLU/RSV testing.  Fact Sheet for Patients: EntrepreneurPulse.com.au  Fact Sheet for Healthcare Providers: IncredibleEmployment.be  This test is not yet approved or  cleared by the [place] and has been authorized for detection and/or diagnosis of SARS-CoV-2 by FDA under an Emergency Use Authorization (EUA). This EUA will remain in effect (meaning this test can be used) for the duration of the COVID-19 declaration under Section 564(b)(1) of the Act, 21 U.S.C. section 360bbb-3(b)(1), unless the authorization is terminated or revoked.  Performed at [facility], [place] [address] [place], [place] [address]   Resp Panel by RT-PCR (Flu A&B, Covid) Nasopharyngeal Swab     Status: None   Collection Time: 09/17/20  8:44 PM   Specimen: Nasopharyngeal Swab; Nasopharyngeal(NP) swabs in vial transport medium  Result Value Ref Range Status   SARS Coronavirus 2 by RT PCR NEGATIVE NEGATIVE Final    Comment: (NOTE) SARS-CoV-2 target nucleic acids are NOT DETECTED.  The SARS-CoV-2 RNA is generally detectable in upper respiratory specimens during the acute phase of infection. The lowest concentration of SARS-CoV-2 viral copies this assay can detect is 138 copies/mL. A negative result does not preclude SARS-Cov-2 infection and should not be used as the sole basis for treatment or other patient management decisions. A negative result may occur with  improper specimen collection/handling, submission of specimen other than nasopharyngeal swab, presence of viral mutation(s) within the areas targeted by this assay, and inadequate number of viral copies(<138 copies/mL). A negative result must be combined with clinical observations, patient history, and epidemiological information. The expected result is Negative.  Fact Sheet for Patients:   EntrepreneurPulse.com.au  Fact Sheet for Healthcare Providers:  IncredibleEmployment.be  This test is no t yet approved or cleared by the [place] FDA and  has been authorized for detection and/or diagnosis of SARS-CoV-2 by FDA under an Emergency Use Authorization (EUA). This EUA will remain  in effect (meaning this test can be used) for the duration of the COVID-19 declaration under Section 564(b)(1) of the Act, 21 U.S.C.section 360bbb-3(b)(1), unless the authorization is terminated  or revoked sooner.       Influenza A by PCR NEGATIVE NEGATIVE Final   Influenza B by PCR NEGATIVE NEGATIVE Final    Comment: (NOTE) The Xpert Xpress SARS-CoV-2/FLU/RSV plus assay is intended as an aid in the diagnosis of influenza from Nasopharyngeal swab specimens and should not be used as a sole basis for treatment. Nasal washings and aspirates are unacceptable for Xpert Xpress SARS-CoV-2/FLU/RSV testing.  Fact Sheet for Patients: EntrepreneurPulse.com.au  Fact Sheet for Healthcare Providers: IncredibleEmployment.be  This test is not yet approved or cleared by the [place] FDA and has been authorized for detection and/or diagnosis of SARS-CoV-2 by FDA under an Emergency Use Authorization (EUA). This EUA will remain in effect (meaning this test can be used) for the duration of the COVID-19 declaration under Section 564(b)(1) of the Act, 21 U.S.C. section 360bbb-3(b)(1), unless the authorization is terminated or revoked.  Performed at [facility], [place] [address] [place], [place] [address]   MRSA PCR Screening     Status: Abnormal   Collection Time: 09/19/20 12:24 AM   Specimen: Nasal Mucosa; Nasopharyngeal  Result Value Ref Range Status   MRSA by PCR POSITIVE (A) NEGATIVE Final    Comment:        The GeneXpert MRSA Assay (FDA approved for NASAL specimens only), is one component of a comprehensive  MRSA colonization surveillance program. It is not intended to diagnose MRSA infection nor to guide or monitor treatment for MRSA infections. RESULT CALLED TO, READ BACK BY AND VERIFIED WITH: Frankey Poot RN  09/19/20 0629 JDW Performed at [facility] [address], [place],  [address]     RADIOLOGY STUDIES/RESULTS: DG Chest Port 1 View  Result Date: 09/17/2020 CLINICAL DATA:  Weakness EXAM: PORTABLE CHEST 1 VIEW COMPARISON:  Radiograph 09/13/2020 FINDINGS: Stable cardiomediastinal contours. Some minimal vascular congestion without features of frank edema. No consolidation, pneumothorax, or effusion. Telemetry leads overlie the chest. No acute osseous or soft tissue abnormality. IMPRESSION: Minimal vascular congestion without features of frank edema. No other acute cardiopulmonary abnormality. Electronically Signed   By: Lovena Le M.D.   On: 09/17/2020 22:07     LOS: 2 days   Oren Binet, MD  [facility]    To contact the attending provider between 7A-7P or the covering provider during after hours 7P-7A, please log into the web site [email or web address] and access using universal [place] password for that web site. If you do not have the password, please call the hospital operator.  09/19/2020, 10:52 AM

## 2020-09-19 NOTE — Progress Notes (Signed)
OT Cancellation Note  Patient Details Name: [name] MRN: [id number] DOB: [birth year]   Cancelled Treatment:    Reason Eval/Treat Not Completed: Medical issues which prohibited therapy. Per chart, Hgb 6.3 with plans for transfusion. Will hold on OT eval until transfusion completed for OOB attempts   Layla Maw 09/19/2020, 1:58 PM

## 2020-09-20 ENCOUNTER — Inpatient Hospital Stay (HOSPITAL_COMMUNITY): Payer: Medicare Other | Admitting: Anesthesiology

## 2020-09-20 ENCOUNTER — Encounter (HOSPITAL_COMMUNITY)
Admission: EM | Disposition: A | Discharge status: Home Health | Payer: Self-pay | Source: Home / Self Care | Attending: Internal Medicine

## 2020-09-20 ENCOUNTER — Encounter (HOSPITAL_COMMUNITY): Payer: Self-pay | Admitting: Family Medicine

## 2020-09-20 DIAGNOSIS — I10 Essential (primary) hypertension: Secondary | ICD-10-CM | POA: Diagnosis not present

## 2020-09-20 DIAGNOSIS — N186 End stage renal disease: Secondary | ICD-10-CM | POA: Diagnosis not present

## 2020-09-20 DIAGNOSIS — K922 Gastrointestinal hemorrhage, unspecified: Secondary | ICD-10-CM | POA: Diagnosis not present

## 2020-09-20 DIAGNOSIS — I2510 Atherosclerotic heart disease of native coronary artery without angina pectoris: Secondary | ICD-10-CM | POA: Diagnosis not present

## 2020-09-20 HISTORY — PX: BIOPSY: SHX5522

## 2020-09-20 HISTORY — PX: ESOPHAGOGASTRODUODENOSCOPY (EGD) WITH PROPOFOL: SHX5813

## 2020-09-20 LAB — RENAL FUNCTION PANEL
Albumin: 2.2 g/dL — ABNORMAL LOW (ref 3.5–5.0)
Anion gap: 11 (ref 5–15)
BUN: 56 mg/dL — ABNORMAL HIGH (ref 8–23)
CO2: 26 mmol/L (ref 22–32)
Calcium: 8.3 mg/dL — ABNORMAL LOW (ref 8.9–10.3)
Chloride: 98 mmol/L (ref 98–111)
Creatinine, Ser: 5.34 mg/dL — ABNORMAL HIGH (ref 0.44–1.00)
GFR, Estimated: 9 mL/min — ABNORMAL LOW (ref >60)
Glucose, Bld: 228 mg/dL — ABNORMAL HIGH (ref 70–99)
Phosphorus: 5.2 mg/dL — ABNORMAL HIGH (ref 2.5–4.6)
Potassium: 4.2 mmol/L (ref 3.5–5.1)
Sodium: 135 mmol/L (ref 135–145)

## 2020-09-20 LAB — CBC
HCT: 20.2 % — ABNORMAL LOW (ref 36.0–46.0)
HCT: 22.9 % — ABNORMAL LOW (ref 36.0–46.0)
HCT: 23.8 % — ABNORMAL LOW (ref 36.0–46.0)
Hemoglobin: 6.4 g/dL — CL (ref 12.0–15.0)
Hemoglobin: 7.5 g/dL — ABNORMAL LOW (ref 12.0–15.0)
Hemoglobin: 8 g/dL — ABNORMAL LOW (ref 12.0–15.0)
MCH: 29.4 pg (ref 26.0–34.0)
MCH: 30.1 pg (ref 26.0–34.0)
MCH: 30.7 pg (ref 26.0–34.0)
MCHC: 31.7 g/dL (ref 30.0–36.0)
MCHC: 32.8 g/dL (ref 30.0–36.0)
MCHC: 33.6 g/dL (ref 30.0–36.0)
MCV: 92.7 fL (ref 80.0–100.0)
MCV: 92 fL (ref 80.0–100.0)
MCV: 91.2 fL (ref 80.0–100.0)
Platelets: 196 10*3/uL (ref 150–400)
Platelets: 174 10*3/uL (ref 150–400)
Platelets: 213 10*3/uL (ref 150–400)
RBC: 2.18 MIL/uL — ABNORMAL LOW (ref 3.87–5.11)
RBC: 2.49 MIL/uL — ABNORMAL LOW (ref 3.87–5.11)
RBC: 2.61 MIL/uL — ABNORMAL LOW (ref 3.87–5.11)
RDW: 18.7 % — ABNORMAL HIGH (ref 11.5–15.5)
RDW: 17.9 % — ABNORMAL HIGH (ref 11.5–15.5)
RDW: 18.5 % — ABNORMAL HIGH (ref 11.5–15.5)
WBC: 9.2 10*3/uL (ref 4.0–10.5)
WBC: 7.8 10*3/uL (ref 4.0–10.5)
WBC: 8.8 10*3/uL (ref 4.0–10.5)
nRBC: 0.7 % — ABNORMAL HIGH (ref 0.0–0.2)
nRBC: 0.4 % — ABNORMAL HIGH (ref 0.0–0.2)
nRBC: 0.3 % — ABNORMAL HIGH (ref 0.0–0.2)

## 2020-09-20 LAB — PREPARE RBC (CROSSMATCH)

## 2020-09-20 LAB — GLUCOSE, CAPILLARY
Glucose-Capillary: 158 mg/dL — ABNORMAL HIGH (ref 70–99)
Glucose-Capillary: 201 mg/dL — ABNORMAL HIGH (ref 70–99)
Glucose-Capillary: 230 mg/dL — ABNORMAL HIGH (ref 70–99)
Glucose-Capillary: 259 mg/dL — ABNORMAL HIGH (ref 70–99)
Glucose-Capillary: 322 mg/dL — ABNORMAL HIGH (ref 70–99)

## 2020-09-20 LAB — MAGNESIUM: Magnesium: 2 mg/dL (ref 1.7–2.4)

## 2020-09-20 SURGERY — ESOPHAGOGASTRODUODENOSCOPY (EGD) WITH PROPOFOL
Anesthesia: Monitor Anesthesia Care

## 2020-09-20 MED ORDER — SODIUM CHLORIDE 0.9% IV SOLUTION: Freq: Once | INTRAVENOUS | Status: AC

## 2020-09-20 MED ORDER — PROPOFOL 500 MG/50ML IV EMUL
INTRAVENOUS | Status: DC | PRN
  Administered 2020-09-20: 75 ug/kg/min via INTRAVENOUS

## 2020-09-20 MED ORDER — SODIUM CHLORIDE 0.9 % IV SOLN: INTRAVENOUS | Status: DC | PRN

## 2020-09-20 MED ORDER — SUCRALFATE 1 G PO TABS
1.0000 g | ORAL_TABLET | Freq: Three times a day (TID) | ORAL | Status: DC
  Administered 2020-09-20 – 2020-09-24 (×14): 1 g via ORAL
  Filled 2020-09-20 (×14): qty 1

## 2020-09-20 SURGICAL SUPPLY — 15 items

## 2020-09-20 NOTE — Anesthesia Postprocedure Evaluation (Signed)
Anesthesia Post Note  Patient: [name]  Procedure(s) Performed: ESOPHAGOGASTRODUODENOSCOPY (EGD) WITH PROPOFOL (N/A ) BIOPSY     Patient location during evaluation: Endoscopy Anesthesia Type: MAC Level of consciousness: awake and alert Pain management: pain level controlled Vital Signs Assessment: post-procedure vital signs reviewed and stable Respiratory status: spontaneous breathing, nonlabored ventilation, respiratory function stable and patient connected to nasal cannula oxygen Cardiovascular status: blood pressure returned to baseline and stable Postop Assessment: no apparent nausea or vomiting Anesthetic complications: no   No complications documented.  Last Vitals:  Vitals:   09/20/20 1216 09/20/20 1235  BP: (!) 131/39 (!) 118/46  Pulse: 93 93  Resp: 16 18  Temp:  36.9 C  SpO2: 96% 98%    Last Pain:  Vitals:   09/20/20 1235  TempSrc: Oral  PainSc:                  Doyal Saric L Dimonique Bourdeau

## 2020-09-20 NOTE — Evaluation (Signed)
Occupational Therapy Evaluation Patient Details Name: [name] MRN: [id number] DOB: 1955/10/26 Today's Date: 09/20/2020    History of Present Illness 65 y.o. female presenting with fatigue, nausea during hemodialysis, and dark tarry colostomy output for 2 days. In ED found to have Hgb of 6.4 provided 1 unit PRBC. Admitted 09/19/20 for Upper GI bleeding with symptomatic anemia, hyperosmolar hyperglycemia, and hyperglycemia. Pt received  PMH: coronary artery disease, insulin-dependent diabetes mellitus, chronic anemia, hepatitis C, depression, anxiety, ESRD on hemodialysis, and necrotizing fasciitis status post multiple debridements and colostomy in January.   Clinical Impression   PTA, pt was living with her daughter and was independent with BADLs and using RW as needed. Pt currently requiring Min guard-Min A for ADLs and functional mobility with RW. Pt presenting with decreased strength and activity tolerance. Pt would benefit from further acute OT to facilitate safe dc. Recommend dc to home with HHOT for further OT to optimize safety, independence with ADLs, and return to PLOF.     Follow Up Recommendations  Home health OT;Supervision/Assistance - 24 hour    Equipment Recommendations  None recommended by OT    Recommendations for Other Services PT consult     Precautions / Restrictions        Mobility Bed Mobility Overal bed mobility: Needs Assistance Bed Mobility: Supine to Sit     Supine to sit: Supervision;HOB elevated     General bed mobility comments: Increased time and effort    Transfers Overall transfer level: Needs assistance Equipment used: Rolling walker (2 wheeled) Transfers: Sit to/from Stand Sit to Stand: Min guard         General transfer comment: Min Guard A for safety and increased time    Balance Overall balance assessment: Needs assistance Sitting-balance support: No upper extremity supported;Feet supported Sitting balance-Leahy Scale: Fair      Standing balance support: No upper extremity supported;During functional activity Standing balance-Leahy Scale: Fair                             ADL either performed or assessed with clinical judgement   ADL Overall ADL's : Needs assistance/impaired Eating/Feeding: Set up;Sitting   Grooming: Wash/dry hands;Min guard;Standing   Upper Body Bathing: Minimal assistance;Sitting   Lower Body Bathing: Minimal assistance;Sit to/from stand   Upper Body Dressing : Min guard;Sitting   Lower Body Dressing: Minimal assistance;Sit to/from stand   Toilet Transfer: Min guard;Ambulation;RW           Functional mobility during ADLs: Min guard;Minimal assistance;Rolling walker General ADL Comments: Pt performing ADLs and functional mobility at [employer] A level     Vision         Perception     Praxis      Pertinent Vitals/Pain Pain Assessment: No/denies pain     Hand Dominance Right   Extremity/Trunk Assessment Upper Extremity Assessment Upper Extremity Assessment: Overall WFL for tasks assessed   Lower Extremity Assessment Lower Extremity Assessment: Defer to PT evaluation   Cervical / Trunk Assessment Cervical / Trunk Assessment: Other exceptions Cervical / Trunk Exceptions: increased body habitus. Ostomy   Communication Communication Communication: No difficulties   Cognition Arousal/Alertness: Awake/alert Behavior During Therapy: WFL for tasks assessed/performed Overall Cognitive Status: Within Functional Limits for tasks assessed  General Comments  VSS on RA    Exercises     Shoulder Instructions      Home Living Family/patient expects to be discharged to:: Private residence Living Arrangements: Children Available Help at Discharge: Family;Available 24 hours/day Type of Home: House Home Access: Stairs to enter [employer] of Steps: 1   Home Layout: One level      Bathroom Shower/Tub: Teacher, early years/pre: Standard     Home Equipment: Environmental consultant - 2 wheels;Bedside commode          Prior Functioning/Environment Level of Independence: Needs assistance  Gait / Transfers Assistance Needed: ambulates household distances with RW ADL's / Homemaking Assistance Needed: daughter assist with set up for bird bath, assists with cooking            OT Problem List: Decreased strength;Decreased activity tolerance;Decreased range of motion;Impaired balance (sitting and/or standing);Decreased safety awareness;Decreased knowledge of use of DME or AE;Decreased knowledge of precautions      OT Treatment/Interventions: Self-care/ADL training;Energy conservation;DME and/or AE instruction;Therapeutic exercise;Patient/family education    OT Goals(Current goals can be found in the care plan section) Acute Rehab OT Goals Patient Stated Goal: Go home OT Goal Formulation: With patient Time For Goal Achievement: 10/04/20 Potential to Achieve Goals: Good  OT Frequency: Min 2X/week   Barriers to D/C:            Co-evaluation              AM-PAC OT "6 Clicks" Daily Activity     Outcome Measure Help from another person eating meals?: None Help from another person taking care of personal grooming?: A Little Help from another person toileting, which includes using toliet, bedpan, or urinal?: A Little Help from another person bathing (including washing, rinsing, drying)?: A Little Help from another person to put on and taking off regular upper body clothing?: None Help from another person to put on and taking off regular lower body clothing?: A Little 6 Click Score: 20   End of Session Equipment Utilized During Treatment: Rolling walker;Gait belt Nurse Communication: Mobility status  Activity Tolerance: Patient tolerated treatment well Patient left: in chair;with call bell/phone within reach  OT Visit Diagnosis: Unsteadiness on feet  (R26.81);Other abnormalities of gait and mobility (R26.89);Muscle weakness (generalized) (M62.81)                Time: 8032-1224 OT Time Calculation (min): 23 min Charges:  OT General Charges $OT Visit: 1 Visit OT Evaluation $OT Eval Low Complexity: [place], OTR/L Acute Rehab Pager: [phone] Office: [place] 09/20/2020, 3:34 PM

## 2020-09-20 NOTE — Op Note (Signed)
[facility] Patient Name: [name] Procedure Date : 09/20/2020 MRN: [id number] Attending MD: Carol Ada , MD Date of Birth: 1956/05/13 CSN: [id number] Age: 65 Admit Type: Inpatient Procedure:                Upper GI endoscopy Indications:              Acute post hemorrhagic anemia, Melena Providers:                Carol Ada, MD, Ladona Ridgel, Technician, Clyde Lundborg, RN Referring MD:              Medicines:                Propofol per Anesthesia Complications:            No immediate complications. Estimated Blood Loss:     Estimated blood loss: none. Procedure:                Pre-Anesthesia Assessment:                           - Prior to the procedure, a History and Physical                            was performed, and patient medications and                            allergies were reviewed. The patient's tolerance of                            previous anesthesia was also reviewed. The risks                            and benefits of the procedure and the sedation                            options and risks were discussed with the patient.                            All questions were answered, and informed consent                            was obtained. Prior Anticoagulants: The patient has                            taken no previous anticoagulant or antiplatelet                            agents. ASA Grade Assessment: III - A patient with                            severe systemic disease. After reviewing the risks  and benefits, the patient was deemed in                            satisfactory condition to undergo the procedure.                           - Sedation was administered by an anesthesia                            professional. Deep sedation was attained.                           After obtaining informed consent, the endoscope was                            passed under direct  vision. Throughout the                            procedure, the patient's blood pressure, pulse, and                            oxygen saturations were monitored continuously. The                            GIF-H190 ([id number]) Olympus gastroscope was                            introduced through the mouth, and advanced to the                            second part of duodenum. The upper GI endoscopy was                            accomplished without difficulty. The patient                            tolerated the procedure well. Scope In: Scope Out: Findings:      The esophagus was normal.      The entire examined stomach was normal. Biopsies were taken with a cold       forceps for Helicobacter pylori testing.      Two non-bleeding cratered duodenal ulcers with a clean ulcer base       (Forrest Class III) were found in the duodenal bulb. The largest lesion       was 15 mm in largest dimension.      In the duodenal bulb two cratered ulcers were identified. Both were       essentially clean-based and there was no evidence of any active       bleeding. In the second portion of the duodenum there was the       possibility of two healing ulcers. There was a stellate appearance. Cold       biopsies were obtained to ensure that these areas were benign. Gastric       biopsies were obtained to check for H. pylori. Impression:               -  Normal esophagus.                           - Normal stomach. Biopsied.                           - Non-bleeding duodenal ulcers with a clean ulcer                            base (Forrest Class III). Recommendation:           - Return patient to hospital ward for ongoing care.                           - Resume regular diet.                           - Continue present medications.                           - Await pathology results. If positive for H.                            pylori she will be treated with quadruple therapy.                            - Continue with pantoprazole. She can be converted                            over to PO BID x 1 month.                           - Sucralfate 1 gram TID x 1 month.                           - Follow HGB and transfuse as necessary.                           - Signing off. Procedure Code(s):        --- Professional ---                           [phone], Esophagogastroduodenoscopy, flexible,                            transoral; with biopsy, single or multiple Diagnosis Code(s):        --- Professional ---                           K26.9, Duodenal ulcer, unspecified as acute or                            chronic, without hemorrhage or perforation                           D62, Acute posthemorrhagic anemia  K92.1, Melena (includes Hematochezia) CPT copyright 2019 [employer]. All rights reserved. The codes documented in this report are preliminary and upon coder review may  be revised to meet current compliance requirements. Carol Ada, MD Carol Ada, MD 09/20/2020 11:56:55 AM This report has been signed electronically. Number of Addenda: 0

## 2020-09-20 NOTE — Anesthesia Preprocedure Evaluation (Addendum)
Anesthesia Evaluation  Patient identified by MRN, date of birth, ID band Patient awake    Reviewed: Allergy & Precautions, NPO status , Patient's Chart, lab work & pertinent test results, reviewed documented beta blocker date and time   Airway Mallampati: I  TM Distance: >3 FB Neck ROM: Full    Dental  (+) Missing, Dental Advisory Given,    Pulmonary asthma , sleep apnea , COPD, former smoker,    Pulmonary exam normal breath sounds clear to auscultation       Cardiovascular hypertension, Pt. on home beta blockers and Pt. on medications + CAD, + Past MI, + Cardiac Stents (2018) and +CHF  Normal cardiovascular exam+ Valvular Problems/Murmurs (mild/mod AS) AS  Rhythm:Regular Rate:Normal  TTE 2020 - Left ventricle: The cavity size was normal. Wall thickness was  increased in a pattern of moderate LVH. Systolic function was  normal. The estimated ejection fraction was in the range of 60%  to 65%. Doppler parameters are consistent with abnormal left  ventricular relaxation (grade 1 diastolic dysfunction).  - Aortic valve: There was mild to moderate stenosis. There was  trivial regurgitation. Valve area (VTI): 2.07 cm^2. Valve area  (Vmax): 1.69 cm^2. Valve area (Vmean): 1.67 cm^2.  - Mitral valve: Calcified annulus.  - Left atrium: The atrium was mildly dilated.  - Atrial septum: No defect or patent foramen ovale was identified.  LHC 2018 Conclusions: 1. Widely patent mid LAD stent. 2. No significant change in mild to moderate LCx, OM, and diagonal disease compared with 09/2016. 3. Normal left ventricular filling pressure.     Neuro/Psych PSYCHIATRIC DISORDERS Depression negative neurological ROS     GI/Hepatic GERD  Medicated and Controlled,(+) Hepatitis -, Cnecrotizing fasciitis status post multiple debridements and colostomy   Endo/Other  diabetes, Insulin DependentMorbid obesity (BMI 44)  Renal/GU ESRF and  DialysisRenal disease (Dialysis TThS, Cr 5.34, K 4.2, had dialysis this AM)  negative genitourinary   Musculoskeletal  (+) Arthritis ,   Abdominal   Peds  Hematology negative hematology ROS (+) anemia ,   Anesthesia Other Findings Presented on 3/8 with dark colostomy output and hyperosmolar hyperglycemic state now improved   Reproductive/Obstetrics                            Anesthesia Physical Anesthesia Plan  ASA: III  Anesthesia Plan: MAC   Post-op Pain Management:    Induction: Intravenous  PONV Risk Score and Plan: Propofol infusion and Treatment may vary due to age or medical condition  Airway Management Planned: Natural Airway  Additional Equipment:   Intra-op Plan:   Post-operative Plan:   Informed Consent: I have reviewed the patients History and Physical, chart, labs and discussed the procedure including the risks, benefits and alternatives for the proposed anesthesia with the patient or authorized representative who has indicated his/her understanding and acceptance.     Dental advisory given  Plan Discussed with: CRNA  Anesthesia Plan Comments:        Anesthesia Quick Evaluation

## 2020-09-20 NOTE — Progress Notes (Signed)
HOSPITAL MEDICINE OVERNIGHT EVENT NOTE    Notified by nursing that patient's hemoglobin has dropped today from 7.2-6.4.  Patient had received 1 unit packed red blood cell transfusion earlier in the day on 3/10.  Patient currently hemodynamically stable, denying chest pain and denies shortness of breath.  Ordering additional 1 unit of packed red blood cell transfusion to be given with dialysis early this morning.  Posttransfusion H&H also ordered.  Vernelle Emerald  MD [facility]

## 2020-09-20 NOTE — Evaluation (Signed)
Physical Therapy Evaluation Patient Details Name: [name] MRN: [id number] DOB: 03-26-1956 Today's Date: 09/20/2020   History of Present Illness  65 y.o. female presenting with fatigue, nausea during hemodialysis, and dark tarry colostomy output for 2 days. In ED found to have Hgb of 6.4 provided 1 unit PRBC. Admitted 09/19/20 for Upper GI bleeding with symptomatic anemia, hyperosmolar hyperglycemia, and hyperglycemia. Pt received  PMH: coronary artery disease, insulin-dependent diabetes mellitus, chronic anemia, hepatitis C, depression, anxiety, ESRD on hemodialysis, and necrotizing fasciitis status post multiple debridements and colostomy in January.  Clinical Impression  PTA pt living with daughter and grandchildren in single story home with 1 step to enter. Since her last hospitalization pt progressed to being able to ambulate with RW household distances and perform bird bath bathing with set up and assist for LE dressing, reports helping daughter with meal prep. Pt is currently limited in safe mobility by decreased strength and endurance. Pt currently is supervision for bed mobility, min guard for transfers and light min A for ambulation with RW. PT recommending HHPT at discharge to improve strength and endurance for safe navigation of her home environment. PT will continue to follow acutely to progress mobility.     Follow Up Recommendations Home health PT;Supervision for mobility/OOB    Equipment Recommendations  None recommended by PT       Precautions / Restrictions Precautions Precaution Comments: ostomy Restrictions Weight Bearing Restrictions: No      Mobility  Bed Mobility Overal bed mobility: Needs Assistance Bed Mobility: Supine to Sit     Supine to sit: Supervision;HOB elevated     General bed mobility comments: Increased time and effort    Transfers Overall transfer level: Needs assistance Equipment used: Rolling walker (2 wheeled) Transfers: Sit to/from  Stand Sit to Stand: Min guard         General transfer comment: Min Guard A for safety and increased time  Ambulation/Gait Ambulation/Gait assistance: Min guard;Min Web designer (Feet): 20 Feet Assistive device: Rolling walker (2 wheeled) Gait Pattern/deviations: Step-through pattern;Decreased step length - right;Decreased step length - left;Shuffle;Trunk flexed Gait velocity: slowed Gait velocity interpretation: <1.31 ft/sec, indicative of household ambulator General Gait Details: mostly min guard, one period of minA navigating between bed and wall, vc for upright posture, pt would benefit from a bariatric RW to be able to improve proximity to RW      Balance Overall balance assessment: Needs assistance Sitting-balance support: No upper extremity supported;Feet supported Sitting balance-Leahy Scale: Fair     Standing balance support: No upper extremity supported;During functional activity Standing balance-Leahy Scale: Fair                               Pertinent Vitals/Pain Pain Assessment: No/denies pain    Home Living Family/patient expects to be discharged to:: Private residence Living Arrangements: Children Available Help at Discharge: Family;Available 24 hours/day Type of Home: House Home Access: Stairs to enter   [employer] of Steps: 1 Home Layout: One level Home Equipment: Walker - 2 wheels;Bedside commode      Prior Function Level of Independence: Needs assistance   Gait / Transfers Assistance Needed: ambulates household distances with RW  ADL's / Homemaking Assistance Needed: daughter assist with set up for bird bath, assists with cooking        Hand Dominance   Dominant Hand: Right    Extremity/Trunk Assessment   Upper Extremity Assessment Upper Extremity Assessment:  Defer to OT evaluation    Lower Extremity Assessment Lower Extremity Assessment: Generalized weakness    Cervical / Trunk Assessment Cervical  / Trunk Assessment: Other exceptions Cervical / Trunk Exceptions: increased body habitus. Ostomy  Communication   Communication: No difficulties  Cognition Arousal/Alertness: Awake/alert Behavior During Therapy: WFL for tasks assessed/performed Overall Cognitive Status: Within Functional Limits for tasks assessed                                        General Comments General comments (skin integrity, edema, etc.): VSS on RA        Assessment/Plan    PT Assessment Patient needs continued PT services  PT Problem List Decreased strength;Decreased range of motion;Decreased activity tolerance;Decreased balance;Decreased mobility;Decreased coordination       PT Treatment Interventions DME instruction;Gait training;Stair training;Functional mobility training;Therapeutic activities;Therapeutic exercise;Balance training;Cognitive remediation;Patient/family education    PT Goals (Current goals can be found in the Care Plan section)  Acute Rehab PT Goals Patient Stated Goal: Go home PT Goal Formulation: With patient Time For Goal Achievement: 10/04/20 Potential to Achieve Goals: Good    Frequency Min 3X/week    AM-PAC PT "6 Clicks" Mobility  Outcome Measure Help needed turning from your back to your side while in a flat bed without using bedrails?: None Help needed moving from lying on your back to sitting on the side of a flat bed without using bedrails?: None Help needed moving to and from a bed to a chair (including a wheelchair)?: None Help needed standing up from a chair using your arms (e.g., wheelchair or bedside chair)?: None Help needed to walk in hospital room?: A Little Help needed climbing 3-5 steps with a railing? : A Lot 6 Click Score: 21    End of Session Equipment Utilized During Treatment: Gait belt Activity Tolerance: Patient tolerated treatment well Patient left: in chair;with call bell/phone within reach Nurse Communication: Mobility  status PT Visit Diagnosis: Other abnormalities of gait and mobility (R26.89);Muscle weakness (generalized) (M62.81)    Time: 1950-9326 PT Time Calculation (min) (ACUTE ONLY): 23 min   Charges:   PT Evaluation $PT Eval Moderate Complexity: 1 Mod         Coraleigh Sheeran B. Migdalia Dk PT, DPT Acute Rehabilitation Services Pager [phone] Office [phone]  .  [place] 09/20/2020, 4:28 PM

## 2020-09-20 NOTE — Progress Notes (Addendum)
[facility] Progress Note   Subjective:  HD early this am and transfused another unit prbcs  Endoscopy this am.  Seen in room. No new complaints. Some DOE with PT   Objective Vitals:   09/20/20 0644 09/20/20 0726 09/20/20 1000 09/20/20 1032  BP: (!) 111/48 (!) 115/57 (!) 137/55 (!) 143/39  Pulse: 94 93 95 94  Resp: 13 15 15 10   Temp: 99.6 F (37.6 C) 98.4 F (36.9 C) 98.3 F (36.8 C) 97.7 F (36.5 C)  TempSrc: Oral Oral Oral Tympanic  SpO2: 97% 98% 99% 100%  Weight:      Height:       Physical Exam General: ill appearing obese female in NAD Heart: RRR No m,r,g  Lungs: Slightly decreased in bases otherwise CTAB.  Abdomen: Obese, NT, active BS. Ostomy LLQ  Extremities: Trace LE edema  Dialysis Access: L AVF + bruit.    Additional Objective Labs: Basic Metabolic Panel: Recent Labs  Lab 09/14/20 0206 09/17/20 2020 09/19/20 0344 09/19/20 0713 09/20/20 0104  NA 130*   < > 137 137 135  K 4.2   < > 4.6 4.6 4.2  CL 93*   < > 99 100 98  CO2 27   < > 24 25 26   GLUCOSE 392*   < > 177* 215* 228*  BUN 17   < > 48* 47* 56*  CREATININE 4.07*   < > 4.33* 4.57* 5.34*  CALCIUM 8.4*   < > 8.5* 8.2* 8.3*  PHOS 3.8  --  3.6  --  5.2*   < > = values in this interval not displayed.   Liver Function Tests: Recent Labs  Lab 09/17/20 2020 09/19/20 0344 09/20/20 0104  AST 46*  --   --   ALT 36  --   --   ALKPHOS 326*  --   --   BILITOT 0.7  --   --   PROT 5.6*  --   --   ALBUMIN 2.5* 2.5* 2.2*   Recent Labs  Lab 09/17/20 2020  LIPASE 60*   CBC: Recent Labs  Lab 09/17/20 2020 09/18/20 1046 09/19/20 0344 09/19/20 1119 09/19/20 2055 09/20/20 0104  WBC 8.1 10.7* 13.2* 11.4*  --  9.2  NEUTROABS 4.4  --   --   --   --   --   HGB 6.4* 6.8* 8.3* 6.3* 7.2* 6.4*  HCT 20.1* 20.6* 25.0* 19.4* 22.5* 20.2*  MCV 100.0 94.5 91.2 92.4  --  92.7  PLT 237 262 220 188  --  196   Blood Culture    Component Value Date/Time   SDES BLOOD RIGHT FA 12/28/2018  2315   SPECREQUEST  12/28/2018 2315    BOTTLES DRAWN AEROBIC AND ANAEROBIC Blood Culture adequate volume   CULT  12/28/2018 2315    NO GROWTH 5 DAYS Performed at [facility], [address] [place], [place] [address]    REPTSTATUS 01/02/2019 FINAL 12/28/2018 2315    Cardiac Enzymes: No results for input(s): CKTOTAL, CKMB, CKMBINDEX, TROPONINI in the last 168 hours. CBG: Recent Labs  Lab 09/19/20 1206 09/19/20 1645 09/19/20 2015 09/20/20 0051 09/20/20 0728  GLUCAP 222* 221* 240* 230* 201*   Iron Studies: No results for input(s): IRON, TIBC, TRANSFERRIN, FERRITIN in the last 72 hours. @lablastinr3 @ Studies/Results: No results found. Medications: . dextrose 5% lactated ringers Stopped (09/18/20 2013)  . lactated ringers Stopped (09/18/20 1406)  . pantoprozole (PROTONIX) infusion 8 mg/hr (09/19/20 2101)   . [facility] Hold]  Chlorhexidine Gluconate Cloth  6 each Topical Q0600  . [MAR Hold] insulin aspart  0-9 Units Subcutaneous Q4H  . [MAR Hold] insulin detemir  30 Units Subcutaneous BID  . [MAR Hold] mometasone-formoterol  2 puff Inhalation BID  . [MAR Hold] mupirocin ointment  1 application Nasal BID  . [MAR Hold] pantoprazole  40 mg Intravenous Q12H     HD orders: [place] T,Th,S 4 hrs 180NRe 400/500 126.5 kg 2.0K/2.0 Ca L AVF -Heparin 6000 units IV TIW (DC upon Discharge) -Hectorol 4 mcg IV TIW -Sensipar 30 mg PO TIW -Mircera 75 mcg IV q 2 weeks (last HGB on DC 09/14/20 9.4)  Assessment/Plan: 1. UGI bleed-Hgb 6.4 on admit. S/p 3 units prbcs. Last this am. Follow Hb trend.  Upper endoscopy today with non-bleeding duodenal ulcers. Protonix, Carafate started per GI. Carafate should be used for limited duration in ESRD patient.  2. HHS/DMT2 - HHS resolved. Insulin per primary  3. ESRD - HD TTS. Completed HD early this am.  Next HD 3/12.  Hold heparin.  3. Anemia - As above. On ESA as outpatient, last dose 3/8.  4. Secondary hyperparathyroidism -  Continue sensipar,  VDRA. Resume binders when eating.  5. HTN/volume - BP/volume up on admit. Net UF 3.3L with last HD. Not yet to EDW. UF as tolerated.   6. Nutrition - NPO at present. Low Albumin. Renal/Carb mod diet. Protein supps when able to eat.  7. H/O CAD 8. H/O Necrotizing fasciitis R groin with diverting ostomy. S/P multiple debridements. Per primary.   Heather Child PA-C [facility] 09/20/2020,11:53 AM

## 2020-09-20 NOTE — Progress Notes (Signed)
PROGRESS NOTE        PATIENT DETAILS Name: [name] Age: 65 y.o. Sex: female Date of Birth: 1955-09-18 Admit Date: 09/17/2020 Admitting Physician Heather Bulls, MD CZY:[id number], Heather Sorrel, PA-C  Brief Narrative: Patient is a 65 y.o. female ESRD, history of peroneal necrotizing fasciitis s/p colostomy January 2022, DM-2, anemia, depression, anxiety-referred from hemodialysis center for evaluation of weakness and black tarry stools in her ostomy.  She was found to have acute blood loss anemia and hyperosmolar hyperglycemic state-she was transfused PRBC, started on insulin infusion and subsequently transferred to [facility].    Significant events: 3/8>> admit for black stools in ostomy with acute blood loss anemia and hyperosmolar hyperglycemic state requiring IV insulin.  Significant studies: 3/8>> chest x-Shan: No acute abnormality 3/11>> EGD: Multiple nonbleeding ulcers in duodenum  Antimicrobial therapy: None  Microbiology data: None  Procedures : 3/11>> EGD: Multiple nonbleeding ulcers in duodenum  Consults: GI, ESRD  DVT Prophylaxis : SCDs Start: 09/17/20 2246   Subjective: Black output in ostomy.  Looks comfortable.  No chest pain or shortness of breath.   Assessment/Plan: Probable upper GI bleeding with acute blood loss anemia: Continues to have black output in ostomy-numerous clean-based nonbleeding ulcer seen on EGD.  Status post 4 units of PRBC so far-last one was earlier this morning.  Check CBC later this evening.  Will await further recommendations from GI-remains on PPI.  Hyperosmolar hyperglycemic state: Treated with IV insulin-has been transitioned to SQ insulin.  Insulin-dependent DM-2 (A1c 8.4 on 3/4): CBGs relatively stable-continue Levemir 30 units twice daily-SSI-reassess on 3/12 and adjust accordingly.  Recent Labs    09/20/20 0051 09/20/20 0728 09/20/20 1238  GLUCAP 230* 201* 158*   ESRD: On  HD-nephrology following and directing care  CAD: Without any anginal symptoms-aspirin on hold due to GI bleeding-resume when able  HTN: BP remains stable-Coreg/hydralazine remains on hold.  Resume when able.    COPD: Not in exacerbation-continue bronchodilators  History of necrotizing fasciitis involving perineum-s/p multiple debridements-subsequent placement of a colostomy in January 2022  Prolonged QTC: Continue telemetry monitoring-follow K/Mg-avoid QTC prolonging agents.  Repeat twelve-lead EKG in a.m.  Morbid Obesity: Estimated body mass index is 43.49 kg/m as calculated from the following:   Height as of this encounter: 5\' 8"  (1.727 m).   Weight as of this encounter: 129.7 kg.    Diet: Diet Order            Diet regular Room service appropriate? Yes; Fluid consistency: Thin  Diet effective now                  Code Status: Full code   Family Communication: Spoke with daughter-[name] over phone-[phone]-on 3/11  Disposition Plan: Status is: Inpatient  Remains inpatient appropriate because:Inpatient level of care appropriate due to severity of illness   Dispo: The patient is from: Home              Anticipated d/c is to: Home              Patient currently is not medically stable to d/c.   Difficult to place patient No   Barriers to Discharge: Ongoing upper GI bleeding-awaiting endoscopy evaluation-needing frequent CBC monitoring.  Antimicrobial agents: Anti-infectives (From admission, onward)   None       Time spent: 25 minutes-Greater than 50%  of this time was spent in counseling, explanation of diagnosis, planning of further management, and coordination of care.  MEDICATIONS: Scheduled Meds: . Chlorhexidine Gluconate Cloth  6 each Topical Q0600  . insulin aspart  0-9 Units Subcutaneous Q4H  . insulin detemir  30 Units Subcutaneous BID  . mometasone-formoterol  2 puff Inhalation BID  . mupirocin ointment  1 application Nasal BID  .  [START ON 09/21/2020] pantoprazole  40 mg Intravenous Q12H  . sucralfate  1 g Oral TID WC & HS   Continuous Infusions: . dextrose 5% lactated ringers Stopped (09/18/20 2013)  . lactated ringers Stopped (09/18/20 1406)  . pantoprozole (PROTONIX) infusion 8 mg/hr (09/19/20 2101)   PRN Meds:.albuterol, dextrose   PHYSICAL EXAM: Vital signs: Vitals:   09/20/20 1152 09/20/20 1204 09/20/20 1216 09/20/20 1235  BP: (!) 117/50 (!) 132/52 (!) 131/39 (!) 118/46  Pulse: 94 93 93 93  Resp: 16 15 16 18   Temp: 98.1 F (36.7 C)   98.4 F (36.9 C)  TempSrc: Tympanic   Oral  SpO2: 99% 98% 96% 98%  Weight:      Height:       Filed Weights   09/18/20 2035 09/18/20 2305 09/19/20 1524  Weight: 130 kg 130 kg 129.7 kg   Body mass index is 43.49 kg/m.   Gen Exam:Alert awake-not in any distress HEENT:atraumatic, normocephalic Chest: B/L clear to auscultation anteriorly CVS:S1S2 regular Abdomen:soft non tender, non distended Extremities:no edema Neurology: Non focal Skin: no rash  I have personally reviewed following labs and imaging studies  LABORATORY DATA: CBC: Recent Labs  Lab 09/17/20 2020 09/18/20 1046 09/19/20 0344 09/19/20 1119 09/19/20 2055 09/20/20 0104 09/20/20 1237  WBC 8.1 10.7* 13.2* 11.4*  --  9.2 7.8  NEUTROABS 4.4  --   --   --   --   --   --   HGB 6.4* 6.8* 8.3* 6.3* 7.2* 6.4* 7.5*  HCT 20.1* 20.6* 25.0* 19.4* 22.5* 20.2* 22.9*  MCV 100.0 94.5 91.2 92.4  --  92.7 92.0  PLT 237 262 220 188  --  196 350    Basic Metabolic Panel: Recent Labs  Lab 09/14/20 0206 09/17/20 2020 09/18/20 1404 09/18/20 1900 09/19/20 0344 09/19/20 0713 09/20/20 0104  NA 130*   < > 134* 135 137 137 135  K 4.2   < > 4.1 4.3 4.6 4.6 4.2  CL 93*   < > 97* 98 99 100 98  CO2 27   < > 25 25 24 25 26   GLUCOSE 392*   < > 215* 145* 177* 215* 228*  BUN 17   < > 81* 81* 48* 47* 56*  CREATININE 4.07*   < > 5.57* 6.08* 4.33* 4.57* 5.34*  CALCIUM 8.4*   < > 8.2* 8.4* 8.5* 8.2* 8.3*  MG   --   --   --   --   --   --  2.0  PHOS 3.8  --   --   --  3.6  --  5.2*   < > = values in this interval not displayed.    GFR: Estimated Creatinine Clearance: 15.6 mL/min (A) (by C-G formula based on SCr of 5.34 mg/dL (H)).  Liver Function Tests: Recent Labs  Lab 09/14/20 0206 09/17/20 2020 09/19/20 0344 09/20/20 0104  AST  --  46*  --   --   ALT  --  36  --   --   ALKPHOS  --  326*  --   --  BILITOT  --  0.7  --   --   PROT  --  5.6*  --   --   ALBUMIN 2.7* 2.5* 2.5* 2.2*   Recent Labs  Lab 09/17/20 2020  LIPASE 60*   No results for input(s): AMMONIA in the last 168 hours.  Coagulation Profile: Recent Labs  Lab 09/17/20 2020  INR 1.1    Cardiac Enzymes: No results for input(s): CKTOTAL, CKMB, CKMBINDEX, TROPONINI in the last 168 hours.  BNP (last 3 results) No results for input(s): PROBNP in the last 8760 hours.  Lipid Profile: No results for input(s): CHOL, HDL, LDLCALC, TRIG, CHOLHDL, LDLDIRECT in the last 72 hours.  Thyroid Function Tests: No results for input(s): TSH, T4TOTAL, FREET4, T3FREE, THYROIDAB in the last 72 hours.  Anemia Panel: No results for input(s): VITAMINB12, FOLATE, FERRITIN, TIBC, IRON, RETICCTPCT in the last 72 hours.  Urine analysis:    Component Value Date/Time   COLORURINE AMBER (A) 12/13/2019 1258   APPEARANCEUR CLOUDY (A) 12/13/2019 1258   LABSPEC 1.017 12/13/2019 1258   PHURINE 5.0 12/13/2019 1258   GLUCOSEU NEGATIVE 12/13/2019 1258   HGBUR NEGATIVE 12/13/2019 1258   BILIRUBINUR NEGATIVE 12/13/2019 1258   BILIRUBINUR neg 12/17/2016 1538   KETONESUR NEGATIVE 12/13/2019 1258   PROTEINUR 30 (A) 12/13/2019 1258   UROBILINOGEN 0.2 12/17/2016 1538   UROBILINOGEN 0.2 05/29/2010 2100   NITRITE NEGATIVE 12/13/2019 1258   LEUKOCYTESUR LARGE (A) 12/13/2019 1258    Sepsis Labs: Lactic Acid, Venous    Component Value Date/Time   LATICACIDVEN 3.2 (HH) 09/18/2020 0544    MICROBIOLOGY: Recent Results (from the past 240  hour(s))  Resp Panel by RT-PCR (Flu A&B, Covid) Nasopharyngeal Swab     Status: None   Collection Time: 09/13/20  4:45 AM   Specimen: Nasopharyngeal Swab; Nasopharyngeal(NP) swabs in vial transport medium  Result Value Ref Range Status   SARS Coronavirus 2 by RT PCR NEGATIVE NEGATIVE Final    Comment: (NOTE) SARS-CoV-2 target nucleic acids are NOT DETECTED.  The SARS-CoV-2 RNA is generally detectable in upper respiratory specimens during the acute phase of infection. The lowest concentration of SARS-CoV-2 viral copies this assay can detect is 138 copies/mL. A negative result does not preclude SARS-Cov-2 infection and should not be used as the sole basis for treatment or other patient management decisions. A negative result may occur with  improper specimen collection/handling, submission of specimen other than nasopharyngeal swab, presence of viral mutation(s) within the areas targeted by this assay, and inadequate number of viral copies(<138 copies/mL). A negative result must be combined with clinical observations, patient history, and epidemiological information. The expected result is Negative.  Fact Sheet for Patients:  EntrepreneurPulse.com.au  Fact Sheet for Healthcare Providers:  IncredibleEmployment.be  This test is no t yet approved or cleared by the [place] FDA and  has been authorized for detection and/or diagnosis of SARS-CoV-2 by FDA under an Emergency Use Authorization (EUA). This EUA will remain  in effect (meaning this test can be used) for the duration of the COVID-19 declaration under Section 564(b)(1) of the Act, 21 U.S.C.section 360bbb-3(b)(1), unless the authorization is terminated  or revoked sooner.       Influenza A by PCR NEGATIVE NEGATIVE Final   Influenza B by PCR NEGATIVE NEGATIVE Final    Comment: (NOTE) The Xpert Xpress SARS-CoV-2/FLU/RSV plus assay is intended as an aid in the diagnosis of influenza from  Nasopharyngeal swab specimens and should not be used as a sole basis for treatment.  Nasal washings and aspirates are unacceptable for Xpert Xpress SARS-CoV-2/FLU/RSV testing.  Fact Sheet for Patients: EntrepreneurPulse.com.au  Fact Sheet for Healthcare Providers: IncredibleEmployment.be  This test is not yet approved or cleared by the [place] FDA and has been authorized for detection and/or diagnosis of SARS-CoV-2 by FDA under an Emergency Use Authorization (EUA). This EUA will remain in effect (meaning this test can be used) for the duration of the COVID-19 declaration under Section 564(b)(1) of the Act, 21 U.S.C. section 360bbb-3(b)(1), unless the authorization is terminated or revoked.  Performed at [facility], [place] [address] [place], [place] [address]   Resp Panel by RT-PCR (Flu A&B, Covid) Nasopharyngeal Swab     Status: None   Collection Time: 09/17/20  8:44 PM   Specimen: Nasopharyngeal Swab; Nasopharyngeal(NP) swabs in vial transport medium  Result Value Ref Range Status   SARS Coronavirus 2 by RT PCR NEGATIVE NEGATIVE Final    Comment: (NOTE) SARS-CoV-2 target nucleic acids are NOT DETECTED.  The SARS-CoV-2 RNA is generally detectable in upper respiratory specimens during the acute phase of infection. The lowest concentration of SARS-CoV-2 viral copies this assay can detect is 138 copies/mL. A negative result does not preclude SARS-Cov-2 infection and should not be used as the sole basis for treatment or other patient management decisions. A negative result may occur with  improper specimen collection/handling, submission of specimen other than nasopharyngeal swab, presence of viral mutation(s) within the areas targeted by this assay, and inadequate number of viral copies(<138 copies/mL). A negative result must be combined with clinical observations, patient history, and epidemiological information. The expected  result is Negative.  Fact Sheet for Patients:  EntrepreneurPulse.com.au  Fact Sheet for Healthcare Providers:  IncredibleEmployment.be  This test is no t yet approved or cleared by the [place] FDA and  has been authorized for detection and/or diagnosis of SARS-CoV-2 by FDA under an Emergency Use Authorization (EUA). This EUA will remain  in effect (meaning this test can be used) for the duration of the COVID-19 declaration under Section 564(b)(1) of the Act, 21 U.S.C.section 360bbb-3(b)(1), unless the authorization is terminated  or revoked sooner.       Influenza A by PCR NEGATIVE NEGATIVE Final   Influenza B by PCR NEGATIVE NEGATIVE Final    Comment: (NOTE) The Xpert Xpress SARS-CoV-2/FLU/RSV plus assay is intended as an aid in the diagnosis of influenza from Nasopharyngeal swab specimens and should not be used as a sole basis for treatment. Nasal washings and aspirates are unacceptable for Xpert Xpress SARS-CoV-2/FLU/RSV testing.  Fact Sheet for Patients: EntrepreneurPulse.com.au  Fact Sheet for Healthcare Providers: IncredibleEmployment.be  This test is not yet approved or cleared by the [place] FDA and has been authorized for detection and/or diagnosis of SARS-CoV-2 by FDA under an Emergency Use Authorization (EUA). This EUA will remain in effect (meaning this test can be used) for the duration of the COVID-19 declaration under Section 564(b)(1) of the Act, 21 U.S.C. section 360bbb-3(b)(1), unless the authorization is terminated or revoked.  Performed at [facility], [place] [address], [place], [place] [address]   MRSA PCR Screening     Status: Abnormal   Collection Time: 09/19/20 12:24 AM   Specimen: Nasal Mucosa; Nasopharyngeal  Result Value Ref Range Status   MRSA by PCR POSITIVE (A) NEGATIVE Final    Comment:        The GeneXpert MRSA Assay (FDA approved for NASAL  specimens only), is one component of a comprehensive MRSA  colonization surveillance program. It is not intended to diagnose MRSA infection nor to guide or monitor treatment for MRSA infections. RESULT CALLED TO, READ BACK BY AND VERIFIED WITHFrankey Poot RN 09/19/20 1499 JDW Performed at [facility], [place] [address], [place], [place] [address]     RADIOLOGY STUDIES/RESULTS: No results found.   LOS: 3 days   Oren Binet, MD  [facility]    To contact the attending provider between 7A-7P or the covering provider during after hours 7P-7A, please log into the web site [email or web address] and access using universal [place] password for that web site. If you do not have the password, please call the hospital operator.  09/20/2020, 3:21 PM

## 2020-09-20 NOTE — Anesthesia Procedure Notes (Signed)
Procedure Name: MAC Date/Time: 09/20/2020 11:25 AM Performed by: Alain Marion, CRNA Pre-anesthesia Checklist: Patient identified, Emergency Drugs available, Suction available, Patient being monitored and Timeout performed Patient Re-evaluated:Patient Re-evaluated prior to induction Oxygen Delivery Method: Nasal cannula Placement Confirmation: positive ETCO2 and ETT inserted through vocal cords under direct vision

## 2020-09-20 NOTE — Progress Notes (Signed)
PT Cancellation Note  Patient Details Name: [name] MRN: [id number] DOB: Jun 09, 1956   Cancelled Treatment:    Reason Eval/Treat Not Completed: (P) Medical issues which prohibited therapy. Pt Hgb 6 this morning and is receiving PRBC, after which she with be going to Endo for EDG. PT will attempt to follow back this afternoon after procedure as able.   Elizabeth B. Migdalia Dk PT, DPT Acute Rehabilitation Services Pager [phone] Office [phone]    [place] 09/20/2020, 10:27 AM

## 2020-09-20 NOTE — Progress Notes (Signed)
Inpatient Diabetes Program Recommendations  AACE/ADA: New Consensus Statement on Inpatient Glycemic Control (2015)  Target Ranges:  Prepandial:   less than 140 mg/dL      Peak postprandial:   less than 180 mg/dL (1-2 hours)      Critically ill patients:  140 - 180 mg/dL   Lab Results  Component Value Date   GLUCAP 201 (H) 09/20/2020   HGBA1C 8.4 (H) 09/13/2020    Review of Glycemic Control Results for [name], [name] (MRN [id number]) as of 09/20/2020 12:36  Ref. Range 09/19/2020 12:06 09/19/2020 16:45 09/19/2020 20:15 09/20/2020 00:51 09/20/2020 07:28  Glucose-Capillary Latest Ref Range: 70 - 99 mg/dL 222 (H) 221 (H) 240 (H) 230 (H) 201 (H)   Inpatient Diabetes Program Recommendations:   -Increase Levemir to 35 units bid Secure chat sent to Dr. Sloan Leiter.  Thank you, Nani Gasser. [name] Argabright, RN, MSN, CDE  Diabetes Coordinator Inpatient Glycemic Control Team Team Pager [phone] (8am-5pm) 09/20/2020 12:36 PM

## 2020-09-20 NOTE — Transfer of Care (Signed)
Immediate Anesthesia Transfer of Care Note  Patient: [name]  Procedure(s) Performed: ESOPHAGOGASTRODUODENOSCOPY (EGD) WITH PROPOFOL (N/A ) BIOPSY  Patient Location: Endoscopy Unit  Anesthesia Type:MAC  Level of Consciousness: awake, alert  and oriented  Airway & Oxygen Therapy: Patient Spontanous Breathing and Patient connected to nasal cannula oxygen  Post-op Assessment: Report given to RN and Post -op Vital signs reviewed and stable  Post vital signs: Reviewed and stable  Last Vitals:  Vitals Value Taken Time  BP 117/50 09/20/20 1152  Temp    Pulse 93 09/20/20 1153  Resp 16 09/20/20 1153  SpO2 99 % 09/20/20 1153  Vitals shown include unvalidated device data.  Last Pain:  Vitals:   09/20/20 1032  TempSrc: Tympanic  PainSc: 0-No pain         Complications: No complications documented.

## 2020-09-20 NOTE — Interval H&P Note (Signed)
History and Physical Interval Note:  09/20/2020 11:14 AM  [name]  has presented today for surgery, with the diagnosis of melana, IDA.  The various methods of treatment have been discussed with the patient and family. After consideration of risks, benefits and other options for treatment, the patient has consented to  Procedure(s): ESOPHAGOGASTRODUODENOSCOPY (EGD) WITH PROPOFOL (N/A) as a surgical intervention.  The patient's history has been reviewed, patient examined, no change in status, stable for surgery.  I have reviewed the patient's chart and labs.  Questions were answered to the patient's satisfaction.     Nolberto Cheuvront D

## 2020-09-20 NOTE — Progress Notes (Signed)
OT Cancellation Note  Patient Details Name: [name] MRN: [id number] DOB: 1956/05/16   Cancelled Treatment:    Reason Eval/Treat Not Completed: Medical issues which prohibited therapy;Patient at procedure or test/ unavailable. This AM Hgb 6.4. Off the floor now at Endo for EDG. Will return as schedule allows.   [place], OTR/L Acute Rehab Pager: [phone] Office: [phone] 09/20/2020, 10:33 AM

## 2020-09-20 NOTE — Progress Notes (Addendum)
Patient came back to the unit @ around 6:27AM.

## 2020-09-21 DIAGNOSIS — I10 Essential (primary) hypertension: Secondary | ICD-10-CM | POA: Diagnosis not present

## 2020-09-21 DIAGNOSIS — K922 Gastrointestinal hemorrhage, unspecified: Secondary | ICD-10-CM | POA: Diagnosis not present

## 2020-09-21 DIAGNOSIS — N186 End stage renal disease: Secondary | ICD-10-CM | POA: Diagnosis not present

## 2020-09-21 DIAGNOSIS — I2510 Atherosclerotic heart disease of native coronary artery without angina pectoris: Secondary | ICD-10-CM | POA: Diagnosis not present

## 2020-09-21 LAB — CBC
HCT: 23.8 % — ABNORMAL LOW (ref 36.0–46.0)
HCT: 23.9 % — ABNORMAL LOW (ref 36.0–46.0)
Hemoglobin: 7.7 g/dL — ABNORMAL LOW (ref 12.0–15.0)
Hemoglobin: 7.6 g/dL — ABNORMAL LOW (ref 12.0–15.0)
MCH: 30.2 pg (ref 26.0–34.0)
MCH: 29.8 pg (ref 26.0–34.0)
MCHC: 32.4 g/dL (ref 30.0–36.0)
MCHC: 31.8 g/dL (ref 30.0–36.0)
MCV: 93.3 fL (ref 80.0–100.0)
MCV: 93.7 fL (ref 80.0–100.0)
Platelets: 204 10*3/uL (ref 150–400)
Platelets: 183 10*3/uL (ref 150–400)
RBC: 2.55 MIL/uL — ABNORMAL LOW (ref 3.87–5.11)
RBC: 2.55 MIL/uL — ABNORMAL LOW (ref 3.87–5.11)
RDW: 18.6 % — ABNORMAL HIGH (ref 11.5–15.5)
RDW: 18.3 % — ABNORMAL HIGH (ref 11.5–15.5)
WBC: 7 10*3/uL (ref 4.0–10.5)
WBC: 7.3 10*3/uL (ref 4.0–10.5)
nRBC: 0.4 % — ABNORMAL HIGH (ref 0.0–0.2)
nRBC: 0.3 % — ABNORMAL HIGH (ref 0.0–0.2)

## 2020-09-21 LAB — RENAL FUNCTION PANEL
Albumin: 2.5 g/dL — ABNORMAL LOW (ref 3.5–5.0)
Anion gap: 11 (ref 5–15)
BUN: 26 mg/dL — ABNORMAL HIGH (ref 8–23)
CO2: 26 mmol/L (ref 22–32)
Calcium: 8.5 mg/dL — ABNORMAL LOW (ref 8.9–10.3)
Chloride: 97 mmol/L — ABNORMAL LOW (ref 98–111)
Creatinine, Ser: 4.21 mg/dL — ABNORMAL HIGH (ref 0.44–1.00)
GFR, Estimated: 11 mL/min — ABNORMAL LOW (ref >60)
Glucose, Bld: 182 mg/dL — ABNORMAL HIGH (ref 70–99)
Phosphorus: 4.3 mg/dL (ref 2.5–4.6)
Potassium: 3.7 mmol/L (ref 3.5–5.1)
Sodium: 134 mmol/L — ABNORMAL LOW (ref 135–145)

## 2020-09-21 LAB — BPAM RBC
Blood Product Expiration Date: 202204022359
Blood Product Expiration Date: 202204052359
Blood Product Expiration Date: 202204062359
Blood Product Expiration Date: 202204062359
ISSUE DATE / TIME: 202203082254
ISSUE DATE / TIME: 202203092050
ISSUE DATE / TIME: 202203101345
ISSUE DATE / TIME: 202203110627
Unit Type and Rh: 6200
Unit Type and Rh: 6200
Unit Type and Rh: 6200
Unit Type and Rh: 6200

## 2020-09-21 LAB — TYPE AND SCREEN
ABO/RH(D): A POS
Antibody Screen: NEGATIVE
Unit division: 0
Unit division: 0
Unit division: 0
Unit division: 0

## 2020-09-21 LAB — GLUCOSE, CAPILLARY
Glucose-Capillary: 160 mg/dL — ABNORMAL HIGH (ref 70–99)
Glucose-Capillary: 181 mg/dL — ABNORMAL HIGH (ref 70–99)
Glucose-Capillary: 187 mg/dL — ABNORMAL HIGH (ref 70–99)
Glucose-Capillary: 235 mg/dL — ABNORMAL HIGH (ref 70–99)
Glucose-Capillary: 270 mg/dL — ABNORMAL HIGH (ref 70–99)

## 2020-09-21 MED ORDER — ALTEPLASE 2 MG IJ SOLR: 2.0000 mg | Freq: Once | INTRAMUSCULAR | Status: DC | PRN

## 2020-09-21 MED ORDER — SODIUM CHLORIDE 0.9 % IV SOLN: 100.0000 mL | INTRAVENOUS | Status: DC | PRN

## 2020-09-21 MED ORDER — ACETAMINOPHEN 325 MG PO TABS
650.0000 mg | ORAL_TABLET | ORAL | Status: DC | PRN
  Filled 2020-09-21: qty 2

## 2020-09-21 MED ORDER — HEPARIN SODIUM (PORCINE) 1000 UNIT/ML DIALYSIS: 1000.0000 [IU] | INTRAMUSCULAR | Status: DC | PRN

## 2020-09-21 MED ORDER — LIDOCAINE HCL (PF) 1 % IJ SOLN: 5.0000 mL | INTRAMUSCULAR | Status: DC | PRN

## 2020-09-21 MED ORDER — LIDOCAINE-PRILOCAINE 2.5-2.5 % EX CREA: 1.0000 "application " | TOPICAL_CREAM | CUTANEOUS | Status: DC | PRN

## 2020-09-21 MED ORDER — PROSOURCE PLUS PO LIQD
30.0000 mL | Freq: Two times a day (BID) | ORAL | Status: DC
  Administered 2020-09-21: 30 mL via ORAL
  Filled 2020-09-21 (×3): qty 30

## 2020-09-21 MED ORDER — PENTAFLUOROPROP-TETRAFLUOROETH EX AERO: 1.0000 "application " | INHALATION_SPRAY | CUTANEOUS | Status: DC | PRN

## 2020-09-21 NOTE — Plan of Care (Signed)
  Problem: Education: Goal: Knowledge of General Education information will improve Description: Including pain rating scale, medication(s)/side effects and non-pharmacologic comfort measures Outcome: Progressing   Problem: Health Behavior/Discharge Planning: Goal: Ability to manage health-related needs will improve Outcome: Progressing   Problem: Clinical Measurements: Goal: Ability to maintain clinical measurements within normal limits will improve Outcome: Progressing Goal: Will remain free from infection Outcome: Progressing Goal: Diagnostic test results will improve Outcome: Progressing Goal: Respiratory complications will improve Outcome: Progressing Goal: Cardiovascular complication will be avoided Outcome: Progressing   Problem: Activity: Goal: Risk for activity intolerance will decrease Outcome: Progressing   Problem: Nutrition: Goal: Adequate nutrition will be maintained Outcome: Progressing   Problem: Coping: Goal: Level of anxiety will decrease Outcome: Progressing   Problem: Elimination: Goal: Will not experience complications related to bowel motility Outcome: Progressing Goal: Will not experience complications related to urinary retention Outcome: Progressing   Problem: Pain Managment: Goal: General experience of comfort will improve Outcome: Progressing   Problem: Safety: Goal: Ability to remain free from injury will improve Outcome: Progressing   Problem: Skin Integrity: Goal: Risk for impaired skin integrity will decrease Outcome: Progressing   Problem: Education: Goal: Ability to describe self-care measures that may prevent or decrease complications (Diabetes Survival Skills Education) will improve Outcome: Progressing Goal: Individualized Educational Video(s) Outcome: Progressing   Problem: Cardiac: Goal: Ability to maintain an adequate cardiac output will improve Outcome: Progressing   Problem: Health Behavior/Discharge  Planning: Goal: Ability to identify and utilize available resources and services will improve Outcome: Progressing Goal: Ability to manage health-related needs will improve Outcome: Progressing   Problem: Fluid Volume: Goal: Ability to achieve a balanced intake and output will improve Outcome: Progressing   Problem: Metabolic: Goal: Ability to maintain appropriate glucose levels will improve Outcome: Progressing   Problem: Nutritional: Goal: Maintenance of adequate nutrition will improve Outcome: Progressing Goal: Maintenance of adequate weight for body size and type will improve Outcome: Progressing   Problem: Respiratory: Goal: Will regain and/or maintain adequate ventilation Outcome: Progressing   Problem: Urinary Elimination: Goal: Ability to achieve and maintain adequate renal perfusion and functioning will improve Outcome: Progressing   Problem: Education: Goal: Ability to identify signs and symptoms of gastrointestinal bleeding will improve Outcome: Progressing   Problem: Bowel/Gastric: Goal: Will show no signs and symptoms of gastrointestinal bleeding Outcome: Progressing   Problem: Fluid Volume: Goal: Will show no signs and symptoms of excessive bleeding Outcome: Progressing   Problem: Clinical Measurements: Goal: Complications related to the disease process, condition or treatment will be avoided or minimized Outcome: Progressing   Problem: Education: Goal: Knowledge of disease and its progression will improve Outcome: Progressing Goal: Individualized Educational Video(s) Outcome: Progressing   Problem: Fluid Volume: Goal: Compliance with measures to maintain balanced fluid volume will improve Outcome: Progressing   Problem: Health Behavior/Discharge Planning: Goal: Ability to manage health-related needs will improve Outcome: Progressing   Problem: Nutritional: Goal: Ability to make healthy dietary choices will improve Outcome: Progressing    Problem: Clinical Measurements: Goal: Complications related to the disease process, condition or treatment will be avoided or minimized Outcome: Progressing

## 2020-09-21 NOTE — Progress Notes (Signed)
PROGRESS NOTE        PATIENT DETAILS Name: [name] Age: 65 y.o. Sex: female Date of Birth: 1955/10/21 Admit Date: 09/17/2020 Admitting Physician Heather Bulls, MD QMV:[id number], Heather Sorrel, PA-C  Brief Narrative: Patient is a 65 y.o. female ESRD, history of peroneal necrotizing fasciitis s/p colostomy January 2022, DM-2, anemia, depression, anxiety-referred from hemodialysis center for evaluation of weakness and black tarry stools in her ostomy.  She was found to have acute blood loss anemia and hyperosmolar hyperglycemic state-she was transfused PRBC, started on insulin infusion and subsequently transferred to [facility].    Significant events: 3/8>> admit for black stools in ostomy with acute blood loss anemia and hyperosmolar hyperglycemic state requiring IV insulin.  Significant studies: 3/8>> chest x-Turk: No acute abnormality 3/11>> EGD: Multiple nonbleeding ulcers in duodenum  Antimicrobial therapy: None  Microbiology data: None  Procedures : 3/11>> EGD: Multiple nonbleeding ulcers in duodenum  Consults: GI, ESRD  DVT Prophylaxis : SCDs Start: 09/17/20 2246   Subjective: Seen in hemodialysis earlier this morning-brown stools in ostomy black.  Assessment/Plan: Probable upper GI bleeding with acute blood loss anemia: No further black stool in ostomy-brown stools seen this morning-hemoglobin has stabilized after total of 4 units of PRBC transfusion.  Remains on PPI.  Recheck CBC later this evening.  No further recommendations from GI at this point.    Hyperosmolar hyperglycemic state: Treated with IV insulin-has been transitioned to SQ insulin.  Insulin-dependent DM-2 (A1c 8.4 on 3/4): CBGs relatively stable-continue Levemir 30 units twice daily-SSI-reassess on 3/12 and adjust accordingly.  Recent Labs    09/21/20 0022 09/21/20 0428 09/21/20 1227  GLUCAP 181* 187* 160*   ESRD: On HD-nephrology following and directing  care  CAD: Without any anginal symptoms-aspirin on hold due to GI bleeding-resume when able  HTN: BP remains stable-Coreg/hydralazine remains on hold.  Resume when able.    COPD: Not in exacerbation-continue bronchodilators  History of necrotizing fasciitis involving perineum-s/p multiple debridements-subsequent placement of a colostomy in January 2022  Prolonged QTC: Continue telemetry monitoring-follow K/Mg-avoid QTC prolonging agents.    Morbid Obesity: Estimated body mass index is 41.73 kg/m as calculated from the following:   Height as of this encounter: 5\' 8"  (1.727 m).   Weight as of this encounter: 124.5 kg.    Diet: Diet Order            Diet renal with fluid restriction Fluid restriction: 1200 mL Fluid; Room service appropriate? Yes; Fluid consistency: Thin  Diet effective now                  Code Status: Full code   Family Communication: Spoke with daughter-[name] over phone-[phone]-on 3/11  Disposition Plan: Status is: Inpatient  Remains inpatient appropriate because:Inpatient level of care appropriate due to severity of illness   Dispo: The patient is from: Home              Anticipated d/c is to: Home              Patient currently is not medically stable to d/c.   Difficult to place patient No   Barriers to Discharge: Ongoing upper GI bleeding-awaiting endoscopy evaluation-needing frequent CBC monitoring.  Antimicrobial agents: Anti-infectives (From admission, onward)   None       Time spent: 25 minutes-Greater than 50% of this time  was spent in counseling, explanation of diagnosis, planning of further management, and coordination of care.  MEDICATIONS: Scheduled Meds: . (feeding supplement) PROSource Plus  30 mL Oral BID BM  . Chlorhexidine Gluconate Cloth  6 each Topical Q0600  . insulin aspart  0-9 Units Subcutaneous Q4H  . insulin detemir  30 Units Subcutaneous BID  . mometasone-formoterol  2 puff Inhalation BID  .  mupirocin ointment  1 application Nasal BID  . pantoprazole  40 mg Intravenous Q12H  . sucralfate  1 g Oral TID WC & HS   Continuous Infusions: . dextrose 5% lactated ringers Stopped (09/18/20 2013)  . lactated ringers Stopped (09/18/20 1406)   PRN Meds:.acetaminophen, albuterol, dextrose   PHYSICAL EXAM: Vital signs: Vitals:   09/21/20 0955 09/21/20 1100 09/21/20 1130 09/21/20 1150  BP: (!) 126/48 (!) 143/55 (!) 156/59 (!) 119/99  Pulse:    92  Resp: 14     Temp:    98 F (36.7 C)  TempSrc:    Oral  SpO2:    96%  Weight:    124.5 kg  Height:       Filed Weights   09/19/20 1524 09/21/20 0810 09/21/20 1150  Weight: 129.7 kg 127.7 kg 124.5 kg   Body mass index is 41.73 kg/m.   Gen Exam:Alert awake-not in any distress HEENT:atraumatic, normocephalic Chest: B/L clear to auscultation anteriorly CVS:S1S2 regular Abdomen:soft non tender, non distended Extremities:no edema Neurology: Non focal Skin: no rash  I have personally reviewed following labs and imaging studies  LABORATORY DATA: CBC: Recent Labs  Lab 09/17/20 2020 09/18/20 1046 09/19/20 1119 09/19/20 2055 09/20/20 0104 09/20/20 1237 09/20/20 1811 09/21/20 0437  WBC 8.1   < > 11.4*  --  9.2 7.8 8.8 7.0  NEUTROABS 4.4  --   --   --   --   --   --   --   HGB 6.4*   < > 6.3* 7.2* 6.4* 7.5* 8.0* 7.7*  HCT 20.1*   < > 19.4* 22.5* 20.2* 22.9* 23.8* 23.8*  MCV 100.0   < > 92.4  --  92.7 92.0 91.2 93.3  PLT 237   < > 188  --  196 174 213 204   < > = values in this interval not displayed.    Basic Metabolic Panel: Recent Labs  Lab 09/18/20 1900 09/19/20 0344 09/19/20 0713 09/20/20 0104 09/21/20 0437  NA 135 137 137 135 134*  K 4.3 4.6 4.6 4.2 3.7  CL 98 99 100 98 97*  CO2 25 24 25 26 26   GLUCOSE 145* 177* 215* 228* 182*  BUN 81* 48* 47* 56* 26*  CREATININE 6.08* 4.33* 4.57* 5.34* 4.21*  CALCIUM 8.4* 8.5* 8.2* 8.3* 8.5*  MG  --   --   --  2.0  --   PHOS  --  3.6  --  5.2* 4.3    GFR: Estimated  Creatinine Clearance: 19.3 mL/min (A) (by C-G formula based on SCr of 4.21 mg/dL (H)).  Liver Function Tests: Recent Labs  Lab 09/17/20 2020 09/19/20 0344 09/20/20 0104 09/21/20 0437  AST 46*  --   --   --   ALT 36  --   --   --   ALKPHOS 326*  --   --   --   BILITOT 0.7  --   --   --   PROT 5.6*  --   --   --   ALBUMIN 2.5* 2.5* 2.2* 2.5*   Recent  Labs  Lab 09/17/20 2020  LIPASE 60*   No results for input(s): AMMONIA in the last 168 hours.  Coagulation Profile: Recent Labs  Lab 09/17/20 2020  INR 1.1    Cardiac Enzymes: No results for input(s): CKTOTAL, CKMB, CKMBINDEX, TROPONINI in the last 168 hours.  BNP (last 3 results) No results for input(s): PROBNP in the last 8760 hours.  Lipid Profile: No results for input(s): CHOL, HDL, LDLCALC, TRIG, CHOLHDL, LDLDIRECT in the last 72 hours.  Thyroid Function Tests: No results for input(s): TSH, T4TOTAL, FREET4, T3FREE, THYROIDAB in the last 72 hours.  Anemia Panel: No results for input(s): VITAMINB12, FOLATE, FERRITIN, TIBC, IRON, RETICCTPCT in the last 72 hours.  Urine analysis:    Component Value Date/Time   COLORURINE AMBER (A) 12/13/2019 1258   APPEARANCEUR CLOUDY (A) 12/13/2019 1258   LABSPEC 1.017 12/13/2019 1258   PHURINE 5.0 12/13/2019 1258   GLUCOSEU NEGATIVE 12/13/2019 1258   HGBUR NEGATIVE 12/13/2019 1258   BILIRUBINUR NEGATIVE 12/13/2019 1258   BILIRUBINUR neg 12/17/2016 1538   KETONESUR NEGATIVE 12/13/2019 1258   PROTEINUR 30 (A) 12/13/2019 1258   UROBILINOGEN 0.2 12/17/2016 1538   UROBILINOGEN 0.2 05/29/2010 2100   NITRITE NEGATIVE 12/13/2019 1258   LEUKOCYTESUR LARGE (A) 12/13/2019 1258    Sepsis Labs: Lactic Acid, Venous    Component Value Date/Time   LATICACIDVEN 3.2 (HH) 09/18/2020 0544    MICROBIOLOGY: Recent Results (from the past 240 hour(s))  Resp Panel by RT-PCR (Flu A&B, Covid) Nasopharyngeal Swab     Status: None   Collection Time: 09/13/20  4:45 AM   Specimen:  Nasopharyngeal Swab; Nasopharyngeal(NP) swabs in vial transport medium  Result Value Ref Range Status   SARS Coronavirus 2 by RT PCR NEGATIVE NEGATIVE Final    Comment: (NOTE) SARS-CoV-2 target nucleic acids are NOT DETECTED.  The SARS-CoV-2 RNA is generally detectable in upper respiratory specimens during the acute phase of infection. The lowest concentration of SARS-CoV-2 viral copies this assay can detect is 138 copies/mL. A negative result does not preclude SARS-Cov-2 infection and should not be used as the sole basis for treatment or other patient management decisions. A negative result may occur with  improper specimen collection/handling, submission of specimen other than nasopharyngeal swab, presence of viral mutation(s) within the areas targeted by this assay, and inadequate number of viral copies(<138 copies/mL). A negative result must be combined with clinical observations, patient history, and epidemiological information. The expected result is Negative.  Fact Sheet for Patients:  EntrepreneurPulse.com.au  Fact Sheet for Healthcare Providers:  IncredibleEmployment.be  This test is no t yet approved or cleared by the [place] FDA and  has been authorized for detection and/or diagnosis of SARS-CoV-2 by FDA under an Emergency Use Authorization (EUA). This EUA will remain  in effect (meaning this test can be used) for the duration of the COVID-19 declaration under Section 564(b)(1) of the Act, 21 U.S.C.section 360bbb-3(b)(1), unless the authorization is terminated  or revoked sooner.       Influenza A by PCR NEGATIVE NEGATIVE Final   Influenza B by PCR NEGATIVE NEGATIVE Final    Comment: (NOTE) The Xpert Xpress SARS-CoV-2/FLU/RSV plus assay is intended as an aid in the diagnosis of influenza from Nasopharyngeal swab specimens and should not be used as a sole basis for treatment. Nasal washings and aspirates are unacceptable for  Xpert Xpress SARS-CoV-2/FLU/RSV testing.  Fact Sheet for Patients: EntrepreneurPulse.com.au  Fact Sheet for Healthcare Providers: IncredibleEmployment.be  This test is not yet approved or  cleared by the [place] and has been authorized for detection and/or diagnosis of SARS-CoV-2 by FDA under an Emergency Use Authorization (EUA). This EUA will remain in effect (meaning this test can be used) for the duration of the COVID-19 declaration under Section 564(b)(1) of the Act, 21 U.S.C. section 360bbb-3(b)(1), unless the authorization is terminated or revoked.  Performed at [facility], [place] [address] [place], [place] [address]   Resp Panel by RT-PCR (Flu A&B, Covid) Nasopharyngeal Swab     Status: None   Collection Time: 09/17/20  8:44 PM   Specimen: Nasopharyngeal Swab; Nasopharyngeal(NP) swabs in vial transport medium  Result Value Ref Range Status   SARS Coronavirus 2 by RT PCR NEGATIVE NEGATIVE Final    Comment: (NOTE) SARS-CoV-2 target nucleic acids are NOT DETECTED.  The SARS-CoV-2 RNA is generally detectable in upper respiratory specimens during the acute phase of infection. The lowest concentration of SARS-CoV-2 viral copies this assay can detect is 138 copies/mL. A negative result does not preclude SARS-Cov-2 infection and should not be used as the sole basis for treatment or other patient management decisions. A negative result may occur with  improper specimen collection/handling, submission of specimen other than nasopharyngeal swab, presence of viral mutation(s) within the areas targeted by this assay, and inadequate number of viral copies(<138 copies/mL). A negative result must be combined with clinical observations, patient history, and epidemiological information. The expected result is Negative.  Fact Sheet for Patients:  EntrepreneurPulse.com.au  Fact Sheet for Healthcare Providers:   IncredibleEmployment.be  This test is no t yet approved or cleared by the [place] FDA and  has been authorized for detection and/or diagnosis of SARS-CoV-2 by FDA under an Emergency Use Authorization (EUA). This EUA will remain  in effect (meaning this test can be used) for the duration of the COVID-19 declaration under Section 564(b)(1) of the Act, 21 U.S.C.section 360bbb-3(b)(1), unless the authorization is terminated  or revoked sooner.       Influenza A by PCR NEGATIVE NEGATIVE Final   Influenza B by PCR NEGATIVE NEGATIVE Final    Comment: (NOTE) The Xpert Xpress SARS-CoV-2/FLU/RSV plus assay is intended as an aid in the diagnosis of influenza from Nasopharyngeal swab specimens and should not be used as a sole basis for treatment. Nasal washings and aspirates are unacceptable for Xpert Xpress SARS-CoV-2/FLU/RSV testing.  Fact Sheet for Patients: EntrepreneurPulse.com.au  Fact Sheet for Healthcare Providers: IncredibleEmployment.be  This test is not yet approved or cleared by the [place] FDA and has been authorized for detection and/or diagnosis of SARS-CoV-2 by FDA under an Emergency Use Authorization (EUA). This EUA will remain in effect (meaning this test can be used) for the duration of the COVID-19 declaration under Section 564(b)(1) of the Act, 21 U.S.C. section 360bbb-3(b)(1), unless the authorization is terminated or revoked.  Performed at [facility], [place] [address], [place], [place] [address]   MRSA PCR Screening     Status: Abnormal   Collection Time: 09/19/20 12:24 AM   Specimen: Nasal Mucosa; Nasopharyngeal  Result Value Ref Range Status   MRSA by PCR POSITIVE (A) NEGATIVE Final    Comment:        The GeneXpert MRSA Assay (FDA approved for NASAL specimens only), is one component of a comprehensive MRSA colonization surveillance program. It is not intended to diagnose  MRSA infection nor to guide or monitor treatment for MRSA infections. RESULT CALLED TO, READ BACK BY AND VERIFIED WITH: Frankey Poot RN  09/19/20 0629 JDW Performed at [facility] [address], [place], [place] [address]     RADIOLOGY STUDIES/RESULTS: No results found.   LOS: 4 days   Oren Binet, MD  [facility]    To contact the attending provider between 7A-7P or the covering provider during after hours 7P-7A, please log into the web site [email or web address] and access using universal [place] password for that web site. If you do not have the password, please call the hospital operator.  09/21/2020, 1:45 PM

## 2020-09-21 NOTE — Progress Notes (Signed)
[facility] Progress Note   Subjective:  Seen in HD unit. UF goal 3L. Tolerating UF No complaints. Denies cp, sob or further bleeding   Objective Vitals:   09/21/20 0810 09/21/20 0815 09/21/20 0830 09/21/20 0900  BP: (!) 146/55 (!) 138/58 (!) 144/58 (!) 145/54  Pulse:      Resp:      Temp: 98.3 F (36.8 C)     TempSrc: Oral     SpO2:      Weight: 127.7 kg     Height:       Physical Exam General: ill appearing obese female in NAD Heart: RRR No m,r,g  Lungs: Slightly decreased in bases otherwise CTAB.  Abdomen: Obese, NT, active BS. Ostomy LLQ  Extremities: Trace LE edema  Dialysis Access: L AVF + bruit.    Additional Objective Labs: Basic Metabolic Panel: Recent Labs  Lab 09/19/20 0344 09/19/20 0713 09/20/20 0104 09/21/20 0437  NA 137 137 135 134*  K 4.6 4.6 4.2 3.7  CL 99 100 98 97*  CO2 24 25 26 26   GLUCOSE 177* 215* 228* 182*  BUN 48* 47* 56* 26*  CREATININE 4.33* 4.57* 5.34* 4.21*  CALCIUM 8.5* 8.2* 8.3* 8.5*  PHOS 3.6  --  5.2* 4.3   Liver Function Tests: Recent Labs  Lab 09/17/20 2020 09/19/20 0344 09/20/20 0104 09/21/20 0437  AST 46*  --   --   --   ALT 36  --   --   --   ALKPHOS 326*  --   --   --   BILITOT 0.7  --   --   --   PROT 5.6*  --   --   --   ALBUMIN 2.5* 2.5* 2.2* 2.5*   Recent Labs  Lab 09/17/20 2020  LIPASE 60*   CBC: Recent Labs  Lab 09/17/20 2020 09/18/20 1046 09/19/20 1119 09/19/20 2055 09/20/20 0104 09/20/20 1237 09/20/20 1811 09/21/20 0437  WBC 8.1   < > 11.4*  --  9.2 7.8 8.8 7.0  NEUTROABS 4.4  --   --   --   --   --   --   --   HGB 6.4*   < > 6.3*   < > 6.4* 7.5* 8.0* 7.7*  HCT 20.1*   < > 19.4*   < > 20.2* 22.9* 23.8* 23.8*  MCV 100.0   < > 92.4  --  92.7 92.0 91.2 93.3  PLT 237   < > 188  --  196 174 213 204   < > = values in this interval not displayed.   Blood Culture    Component Value Date/Time   SDES BLOOD RIGHT FA 12/28/2018 2315   SPECREQUEST  12/28/2018 2315    BOTTLES  DRAWN AEROBIC AND ANAEROBIC Blood Culture adequate volume   CULT  12/28/2018 2315    NO GROWTH 5 DAYS Performed at [facility], [address] [place], [place] [address]    REPTSTATUS 01/02/2019 FINAL 12/28/2018 2315    Cardiac Enzymes: No results for input(s): CKTOTAL, CKMB, CKMBINDEX, TROPONINI in the last 168 hours. CBG: Recent Labs  Lab 09/20/20 1238 09/20/20 1621 09/20/20 2046 09/21/20 0022 09/21/20 0428  GLUCAP 158* 259* 322* 181* 187*   Iron Studies: No results for input(s): IRON, TIBC, TRANSFERRIN, FERRITIN in the last 72 hours. @lablastinr3 @ Studies/Results: No results found. Medications: . sodium chloride    . sodium chloride    . sodium chloride    . sodium chloride    .  dextrose 5% lactated ringers Stopped (09/18/20 2013)  . lactated ringers Stopped (09/18/20 1406)   . Chlorhexidine Gluconate Cloth  6 each Topical Q0600  . insulin aspart  0-9 Units Subcutaneous Q4H  . insulin detemir  30 Units Subcutaneous BID  . mometasone-formoterol  2 puff Inhalation BID  . mupirocin ointment  1 application Nasal BID  . pantoprazole  40 mg Intravenous Q12H  . sucralfate  1 g Oral TID WC & HS     HD orders: [place] T,Th,S 4 hrs 180NRe 400/500 126.5 kg 2.0K/2.0 Ca L AVF -Heparin 6000 units IV TIW (DC upon Discharge) -Hectorol 4 mcg IV TIW -Sensipar 30 mg PO TIW -Mircera 75 mcg IV q 2 weeks (last HGB on DC 09/14/20 9.4)  Assessment/Plan: 1. UGI bleed-Hgb 6.4 on admit. S/p 3 units prbcs. Last 3/11.  Follow Hb trend.  Upper endoscopy 3/11 with non-bleeding duodenal ulcers. Protonix, Carafate started per GI. Carafate should be used for limited duration in ESRD patient.  2. HHS/DMT2 - HHS resolved. Insulin per primary  3. ESRD - HD TTS. HD today. Hold heparin.  3. Anemia - As above. On ESA as outpatient, last dose 3/8.  4. Secondary hyperparathyroidism -  Continue sensipar, VDRA. Resume binders when eating.  5. HTN/volume - BP/volume up on admit. Net UF 3.3L  with last HD. Not yet to EDW. UF as tolerated.   6. Nutrition -  Low Albumin. Renal/Carb mod diet. Protein supps  7. H/O CAD 8. H/O Necrotizing fasciitis R groin with diverting ostomy. S/P multiple debridements. Per primary.   Lynnda Child PA-C [facility] 09/21/2020,9:28 AM

## 2020-09-21 NOTE — Progress Notes (Signed)
Patient off the unit for dialysis.

## 2020-09-22 ENCOUNTER — Inpatient Hospital Stay (HOSPITAL_COMMUNITY): Payer: Medicare Other

## 2020-09-22 DIAGNOSIS — K922 Gastrointestinal hemorrhage, unspecified: Secondary | ICD-10-CM | POA: Diagnosis not present

## 2020-09-22 LAB — RENAL FUNCTION PANEL
Albumin: 2.5 g/dL — ABNORMAL LOW (ref 3.5–5.0)
Anion gap: 8 (ref 5–15)
BUN: 14 mg/dL (ref 8–23)
CO2: 27 mmol/L (ref 22–32)
Calcium: 8.1 mg/dL — ABNORMAL LOW (ref 8.9–10.3)
Chloride: 100 mmol/L (ref 98–111)
Creatinine, Ser: 3.58 mg/dL — ABNORMAL HIGH (ref 0.44–1.00)
GFR, Estimated: 14 mL/min — ABNORMAL LOW (ref >60)
Glucose, Bld: 178 mg/dL — ABNORMAL HIGH (ref 70–99)
Phosphorus: 3.8 mg/dL (ref 2.5–4.6)
Potassium: 3.5 mmol/L (ref 3.5–5.1)
Sodium: 135 mmol/L (ref 135–145)

## 2020-09-22 LAB — CBC
HCT: 21.9 % — ABNORMAL LOW (ref 36.0–46.0)
Hemoglobin: 7.2 g/dL — ABNORMAL LOW (ref 12.0–15.0)
MCH: 30.6 pg (ref 26.0–34.0)
MCHC: 32.9 g/dL (ref 30.0–36.0)
MCV: 93.2 fL (ref 80.0–100.0)
Platelets: 175 10*3/uL (ref 150–400)
RBC: 2.35 MIL/uL — ABNORMAL LOW (ref 3.87–5.11)
RDW: 18.1 % — ABNORMAL HIGH (ref 11.5–15.5)
WBC: 5.5 10*3/uL (ref 4.0–10.5)
nRBC: 0.4 % — ABNORMAL HIGH (ref 0.0–0.2)

## 2020-09-22 LAB — GLUCOSE, CAPILLARY
Glucose-Capillary: 123 mg/dL — ABNORMAL HIGH (ref 70–99)
Glucose-Capillary: 129 mg/dL — ABNORMAL HIGH (ref 70–99)
Glucose-Capillary: 177 mg/dL — ABNORMAL HIGH (ref 70–99)
Glucose-Capillary: 198 mg/dL — ABNORMAL HIGH (ref 70–99)
Glucose-Capillary: 332 mg/dL — ABNORMAL HIGH (ref 70–99)
Glucose-Capillary: 341 mg/dL — ABNORMAL HIGH (ref 70–99)
Glucose-Capillary: 348 mg/dL — ABNORMAL HIGH (ref 70–99)

## 2020-09-22 LAB — URIC ACID: Uric Acid, Serum: 4.4 mg/dL (ref 2.5–7.1)

## 2020-09-22 LAB — PREPARE RBC (CROSSMATCH)

## 2020-09-22 LAB — MAGNESIUM: Magnesium: 1.8 mg/dL (ref 1.7–2.4)

## 2020-09-22 MED ORDER — SODIUM CHLORIDE 0.9% IV SOLUTION: Freq: Once | INTRAVENOUS | Status: AC

## 2020-09-22 MED ORDER — COLCHICINE 0.6 MG PO TABS
0.6000 mg | ORAL_TABLET | Freq: Once | ORAL | Status: AC
  Administered 2020-09-22: 0.6 mg via ORAL
  Filled 2020-09-22: qty 1

## 2020-09-22 MED ORDER — INSULIN GLARGINE 100 UNIT/ML ~~LOC~~ SOLN
40.0000 [IU] | Freq: Once | SUBCUTANEOUS | Status: AC
  Administered 2020-09-22: 40 [IU] via SUBCUTANEOUS
  Filled 2020-09-22: qty 0.4

## 2020-09-22 MED ORDER — INSULIN DETEMIR 100 UNIT/ML ~~LOC~~ SOLN
30.0000 [IU] | Freq: Two times a day (BID) | SUBCUTANEOUS | Status: DC
  Administered 2020-09-22 – 2020-09-24 (×4): 30 [IU] via SUBCUTANEOUS
  Filled 2020-09-22 (×5): qty 0.3

## 2020-09-22 MED ORDER — METHYLPREDNISOLONE SODIUM SUCC 40 MG IJ SOLR
40.0000 mg | INTRAMUSCULAR | Status: AC
  Administered 2020-09-22: 40 mg via INTRAVENOUS
  Filled 2020-09-22: qty 1

## 2020-09-22 NOTE — Progress Notes (Addendum)
[facility] Progress Note   Subjective:  Completed dialysis yesterday 3L UF  Seen in room. Has no complaints this am.  Denies cp, sob.   Objective Vitals:   09/22/20 0031 09/22/20 0435 09/22/20 0750 09/22/20 0817  BP: (!) 138/46 (!) 162/68 (!) 112/43   Pulse: 93 92 91   Resp: 13 20 18    Temp: 97.8 F (36.6 C) 98.4 F (36.9 C) 98.4 F (36.9 C)   TempSrc: Oral Oral Oral   SpO2: 98% 99% 98% 99%  Weight:  125.5 kg    Height:       Physical Exam General: ill appearing obese female in NAD Heart: RRR No m,r,g  Lungs: Slightly decreased in bases otherwise CTAB.  Abdomen: Obese, NT, active BS. Ostomy LLQ  Extremities: Trace LE edema  Dialysis Access: L AVF + bruit.    Additional Objective Labs: Basic Metabolic Panel: Recent Labs  Lab 09/20/20 0104 09/21/20 0437 09/22/20 0113  NA 135 134* 135  K 4.2 3.7 3.5  CL 98 97* 100  CO2 26 26 27   GLUCOSE 228* 182* 178*  BUN 56* 26* 14  CREATININE 5.34* 4.21* 3.58*  CALCIUM 8.3* 8.5* 8.1*  PHOS 5.2* 4.3 3.8   Liver Function Tests: Recent Labs  Lab 09/17/20 2020 09/19/20 0344 09/20/20 0104 09/21/20 0437 09/22/20 0113  AST 46*  --   --   --   --   ALT 36  --   --   --   --   ALKPHOS 326*  --   --   --   --   BILITOT 0.7  --   --   --   --   PROT 5.6*  --   --   --   --   ALBUMIN 2.5*   < > 2.2* 2.5* 2.5*   < > = values in this interval not displayed.   Recent Labs  Lab 09/17/20 2020  LIPASE 60*   CBC: Recent Labs  Lab 09/17/20 2020 09/18/20 1046 09/20/20 1237 09/20/20 1811 09/21/20 0437 09/21/20 1815 09/22/20 0113  WBC 8.1   < > 7.8 8.8 7.0 7.3 5.5  NEUTROABS 4.4  --   --   --   --   --   --   HGB 6.4*   < > 7.5* 8.0* 7.7* 7.6* 7.2*  HCT 20.1*   < > 22.9* 23.8* 23.8* 23.9* 21.9*  MCV 100.0   < > 92.0 91.2 93.3 93.7 93.2  PLT 237   < > 174 213 204 183 175   < > = values in this interval not displayed.   Blood Culture    Component Value Date/Time   SDES BLOOD RIGHT FA 12/28/2018 2315    SPECREQUEST  12/28/2018 2315    BOTTLES DRAWN AEROBIC AND ANAEROBIC Blood Culture adequate volume   CULT  12/28/2018 2315    NO GROWTH 5 DAYS Performed at [facility], [address] [place], [place] [address]    REPTSTATUS 01/02/2019 FINAL 12/28/2018 2315    Cardiac Enzymes: No results for input(s): CKTOTAL, CKMB, CKMBINDEX, TROPONINI in the last 168 hours. CBG: Recent Labs  Lab 09/21/20 1618 09/21/20 2025 09/22/20 0029 09/22/20 0329 09/22/20 0750  GLUCAP 270* 235* 177* 123* 129*   Iron Studies: No results for input(s): IRON, TIBC, TRANSFERRIN, FERRITIN in the last 72 hours. @lablastinr3 @ Studies/Results: DG Foot Complete Left  Result Date: 09/22/2020 CLINICAL DATA:  Chronic left foot pain, worsening, no known injury EXAM: LEFT FOOT -  COMPLETE 3+ VIEW COMPARISON:  None. FINDINGS: There is no evidence of fracture or dislocation. There is no evidence of arthropathy or other focal bone abnormality. Vascular calcinosis. Diffuse soft tissue edema about the foot. IMPRESSION: 1. No fracture or dislocation of the left foot. Joint spaces are well preserved. 2. Diffuse soft tissue edema about the foot. Electronically Signed   By: Eddie Candle M.D.   On: 09/22/2020 09:50   Medications:  . (feeding supplement) PROSource Plus  30 mL Oral BID BM  . Chlorhexidine Gluconate Cloth  6 each Topical Q0600  . insulin aspart  0-9 Units Subcutaneous Q4H  . insulin detemir  30 Units Subcutaneous BID  . mometasone-formoterol  2 puff Inhalation BID  . mupirocin ointment  1 application Nasal BID  . pantoprazole  40 mg Intravenous Q12H  . sucralfate  1 g Oral TID WC & HS     HD orders: [place] T,Th,S 4 hrs 180NRe 400/500 126.5 kg 2.0K/2.0 Ca L AVF -Heparin 6000 units IV TIW (DC upon Discharge) -Hectorol 4 mcg IV TIW -Sensipar 30 mg PO TIW -Mircera 75 mcg IV q 2 weeks (last HGB on DC 09/14/20 9.4)  Assessment/Plan: 1. UGI bleed-Hgb 6.4 on admit. S/p 3 units prbcs. Last 3/11.   Follow Hb trend.  Upper endoscopy 3/11 with non-bleeding duodenal ulcers. Protonix, Carafate started per GI. Carafate should be used for limited duration in ESRD patient. Hb is borderline low at 7.3.  Next HD not until Tuesday. Will plan tranfuse 1u prbc's today, vol status should not be an issue, under dry wt today.  2. HHS/DMT2 - HHS resolved. Insulin per primary  3. ESRD - HD TTS. Next HD 3/15  Hold heparin.  3. Anemia - As above. On ESA as outpatient, last dose 3/8.  4. Secondary hyperparathyroidism -  Continue sensipar, VDRA.  5. HTN/volume - BP/volume up on admit. Improved with HD. Now at EDW. UF as tolerated.   6. Nutrition -  Low Albumin. Renal/Carb mod diet. Protein supps  7. H/O CAD 8. H/O Necrotizing fasciitis R groin with diverting ostomy. S/P multiple debridements. Per primary.   Lynnda Child PA-C [facility] 09/22/2020,10:49 AM  Pt seen, examined and agree w assess/plan as above with additions as indicated.  [facility] 09/22/2020, 3:25 PM

## 2020-09-22 NOTE — Progress Notes (Addendum)
PROGRESS NOTE        PATIENT DETAILS Name: [name] Age: 65 y.o. Sex: female Date of Birth: 1956/06/06 Admit Date: 09/17/2020 Admitting Physician Vianne Bulls, MD [id number], Clearnce Sorrel, PA-C  Brief Narrative: Patient is a 65 y.o. female ESRD, history of peroneal necrotizing fasciitis s/p colostomy January 2022, DM-2, anemia, depression, anxiety-referred from hemodialysis center for evaluation of weakness and black tarry stools in her ostomy.  She was found to have acute blood loss anemia and hyperosmolar hyperglycemic state-she was transfused PRBC, started on insulin infusion and subsequently transferred to [facility].    Significant events: 3/8>> admit for black stools in ostomy with acute blood loss anemia and hyperosmolar hyperglycemic state requiring IV insulin.  Significant studies: 3/8>> chest x-[place]: No acute abnormality 3/11>> EGD: Multiple nonbleeding ulcers in duodenum  Antimicrobial therapy: None  Microbiology data: None  Procedures : 3/11>> EGD: Multiple nonbleeding ulcers in duodenum  Consults: GI, ESRD  DVT Prophylaxis : SCDs Start: 09/17/20 2246   Subjective:  Patient in bed, appears comfortable, denies any headache, no fever, no chest pain or pressure, no shortness of breath , no abdominal pain.  She does have some discomfort in the right groin where she had necrotizing fasciitis and has a chronic wound, also some left foot discomfort.   Assessment/Plan:  Probable upper GI bleeding with acute blood loss anemia: No further black stool in ostomy-brown stools seen this morning-hemoglobin has stabilized after total of 4 units of PRBC transfusion.  GI on board underwent EGD showing nonbleeding duodenal ulcers, continue PPI and monitoring H&H.    Hyperosmolar hyperglycemic state: Treated with IV insulin-has been transitioned to SQ insulin.  Insulin-dependent DM-2 (A1c 8.4 on 3/4): CBGs relatively stable-continue  Levemir 30 units twice daily-SSI-reassess on 3/12 and adjust accordingly.  Recent Labs    09/22/20 0029 09/22/20 0329 09/22/20 0750  GLUCAP 177* 123* 129*   ESRD: On HD-nephrology following and directing care, TTS.  CAD: Without any anginal symptoms-aspirin on hold due to GI bleeding-resume when able  HTN: BP remains stable-Coreg/hydralazine remains on hold.  Resume when able.    COPD: Not in exacerbation-continue bronchodilators  History of necrotizing fasciitis involving perineum-s/p multiple debridements-subsequent placement of a colostomy in January 2022, has a chronic right groin wound for which wound care will be consulted.  Left foot pain for 2 to 3 weeks.  History of gout, check uric acid level, single dose steroids, check left foot x-[place].    Prolonged QTC: Continue telemetry monitoring-follow K/Mg-avoid QTC prolonging agents.    Morbid Obesity: Estimated body mass index is 42.07 kg/m as calculated from the following:   Height as of this encounter: 5\' 8"  (1.727 m).   Weight as of this encounter: 125.5 kg.    Diet: Diet Order            Diet renal with fluid restriction Fluid restriction: 1200 mL Fluid; Room service appropriate? Yes; Fluid consistency: Thin  Diet effective now                  Code Status: Full code   Family Communication: Spoke with daughter-Jazzslyn Jenny over phone-[phone]-on 3/11  Disposition Plan: Status is: Inpatient  Remains inpatient appropriate because:Inpatient level of care appropriate due to severity of illness   Dispo: The patient is from: Home  Anticipated d/c is to: Home              Patient currently is not medically stable to d/c.   Difficult to place patient No   Barriers to Discharge: Ongoing upper GI bleeding-awaiting endoscopy evaluation-needing frequent CBC monitoring.  Antimicrobial agents: Anti-infectives (From admission, onward)   None       Time spent: 25 minutes-Greater than 50% of  this time was spent in counseling, explanation of diagnosis, planning of further management, and coordination of care.  MEDICATIONS: Scheduled Meds: . (feeding supplement) PROSource Plus  30 mL Oral BID BM  . Chlorhexidine Gluconate Cloth  6 each Topical Q0600  . insulin aspart  0-9 Units Subcutaneous Q4H  . insulin detemir  30 Units Subcutaneous BID  . mometasone-formoterol  2 puff Inhalation BID  . mupirocin ointment  1 application Nasal BID  . pantoprazole  40 mg Intravenous Q12H  . sucralfate  1 g Oral TID WC & HS   Continuous Infusions:  PRN Meds:.acetaminophen, albuterol, dextrose   PHYSICAL EXAM: Vital signs: Vitals:   09/22/20 0031 09/22/20 0435 09/22/20 0750 09/22/20 0817  BP: (!) 138/46 (!) 162/68 (!) 112/43   Pulse: 93 92 91   Resp: 13 20 18    Temp: 97.8 F (36.6 C) 98.4 F (36.9 C) 98.4 F (36.9 C)   TempSrc: Oral Oral Oral   SpO2: 98% 99% 98% 99%  Weight:  125.5 kg    Height:       Filed Weights   09/21/20 0810 09/21/20 1150 09/22/20 0435  Weight: 127.7 kg 124.5 kg 125.5 kg   Body mass index is 42.07 kg/m.   Gen Exam:  Awake Alert, No new F.N deficits, Normal affect [place]mo.AT,PERRAL Supple Neck,No JVD, No cervical lymphadenopathy appriciated.  Symmetrical Chest wall movement, Good air movement bilaterally, CTAB RRR,No Gallops, Rubs or new Murmurs, No Parasternal Heave +ve B.Sounds, Abd Soft, No tenderness, No organomegaly appriciated, No rebound - guarding or rigidity. No Cyanosis, healing right groin wound, colostomy in place,   I have personally reviewed following labs and imaging studies  LABORATORY DATA: CBC: Recent Labs  Lab 09/17/20 2020 09/18/20 1046 09/20/20 1237 09/20/20 1811 09/21/20 0437 09/21/20 1815 09/22/20 0113  WBC 8.1   < > 7.8 8.8 7.0 7.3 5.5  NEUTROABS 4.4  --   --   --   --   --   --   HGB 6.4*   < > 7.5* 8.0* 7.7* 7.6* 7.2*  HCT 20.1*   < > 22.9* 23.8* 23.8* 23.9* 21.9*  MCV 100.0   < > 92.0 91.2 93.3 93.7 93.2  PLT  237   < > 174 213 204 183 175   < > = values in this interval not displayed.    Basic Metabolic Panel: Recent Labs  Lab 09/19/20 0344 09/19/20 0713 09/20/20 0104 09/21/20 0437 09/22/20 0113  NA 137 137 135 134* 135  K 4.6 4.6 4.2 3.7 3.5  CL 99 100 98 97* 100  CO2 24 25 26 26 27   GLUCOSE 177* 215* 228* 182* 178*  BUN 48* 47* 56* 26* 14  CREATININE 4.33* 4.57* 5.34* 4.21* 3.58*  CALCIUM 8.5* 8.2* 8.3* 8.5* 8.1*  MG  --   --  2.0  --  1.8  PHOS 3.6  --  5.2* 4.3 3.8    GFR: Estimated Creatinine Clearance: 22.8 mL/min (A) (by C-G formula based on SCr of 3.58 mg/dL (H)).  Liver Function Tests: Recent Labs  Lab 09/17/20 2020  09/19/20 0344 09/20/20 0104 09/21/20 0437 09/22/20 0113  AST 46*  --   --   --   --   ALT 36  --   --   --   --   ALKPHOS 326*  --   --   --   --   BILITOT 0.7  --   --   --   --   PROT 5.6*  --   --   --   --   ALBUMIN 2.5* 2.5* 2.2* 2.5* 2.5*   Recent Labs  Lab 09/17/20 2020  LIPASE 60*   No results for input(s): AMMONIA in the last 168 hours.  Coagulation Profile: Recent Labs  Lab 09/17/20 2020  INR 1.1    Cardiac Enzymes: No results for input(s): CKTOTAL, CKMB, CKMBINDEX, TROPONINI in the last 168 hours.  BNP (last 3 results) No results for input(s): PROBNP in the last 8760 hours.  Lipid Profile: No results for input(s): CHOL, HDL, LDLCALC, TRIG, CHOLHDL, LDLDIRECT in the last 72 hours.  Thyroid Function Tests: No results for input(s): TSH, T4TOTAL, FREET4, T3FREE, THYROIDAB in the last 72 hours.  Anemia Panel: No results for input(s): VITAMINB12, FOLATE, FERRITIN, TIBC, IRON, RETICCTPCT in the last 72 hours.  Urine analysis:    Component Value Date/Time   COLORURINE AMBER (A) 12/13/2019 1258   APPEARANCEUR CLOUDY (A) 12/13/2019 1258   LABSPEC 1.017 12/13/2019 1258   PHURINE 5.0 12/13/2019 1258   GLUCOSEU NEGATIVE 12/13/2019 1258   HGBUR NEGATIVE 12/13/2019 1258   BILIRUBINUR NEGATIVE 12/13/2019 1258   BILIRUBINUR  neg 12/17/2016 1538   KETONESUR NEGATIVE 12/13/2019 1258   PROTEINUR 30 (A) 12/13/2019 1258   UROBILINOGEN 0.2 12/17/2016 1538   UROBILINOGEN 0.2 05/29/2010 2100   NITRITE NEGATIVE 12/13/2019 1258   LEUKOCYTESUR LARGE (A) 12/13/2019 1258    Sepsis Labs: Lactic Acid, Venous    Component Value Date/Time   LATICACIDVEN 3.2 (HH) 09/18/2020 0544    MICROBIOLOGY: Recent Results (from the past 240 hour(s))  Resp Panel by RT-PCR (Flu A&B, Covid) Nasopharyngeal Swab     Status: None   Collection Time: 09/13/20  4:45 AM   Specimen: Nasopharyngeal Swab; Nasopharyngeal(NP) swabs in vial transport medium  Result Value Ref Range Status   SARS Coronavirus 2 by RT PCR NEGATIVE NEGATIVE Final    Comment: (NOTE) SARS-CoV-2 target nucleic acids are NOT DETECTED.  The SARS-CoV-2 RNA is generally detectable in upper respiratory specimens during the acute phase of infection. The lowest concentration of SARS-CoV-2 viral copies this assay can detect is 138 copies/mL. A negative result does not preclude SARS-Cov-2 infection and should not be used as the sole basis for treatment or other patient management decisions. A negative result may occur with  improper specimen collection/handling, submission of specimen other than nasopharyngeal swab, presence of viral mutation(s) within the areas targeted by this assay, and inadequate number of viral copies(<138 copies/mL). A negative result must be combined with clinical observations, patient history, and epidemiological information. The expected result is Negative.  Fact Sheet for Patients:  EntrepreneurPulse.com.au  Fact Sheet for Healthcare Providers:  IncredibleEmployment.be  This test is no t yet approved or cleared by the [place] FDA and  has been authorized for detection and/or diagnosis of SARS-CoV-2 by FDA under an Emergency Use Authorization (EUA). This EUA will remain  in effect (meaning this test  can be used) for the duration of the COVID-19 declaration under Section 564(b)(1) of the Act, 21 U.S.C.section 360bbb-3(b)(1), unless the authorization is terminated  or revoked sooner.       Influenza A by PCR NEGATIVE NEGATIVE Final   Influenza B by PCR NEGATIVE NEGATIVE Final    Comment: (NOTE) The Xpert Xpress SARS-CoV-2/FLU/RSV plus assay is intended as an aid in the diagnosis of influenza from Nasopharyngeal swab specimens and should not be used as a sole basis for treatment. Nasal washings and aspirates are unacceptable for Xpert Xpress SARS-CoV-2/FLU/RSV testing.  Fact Sheet for Patients: EntrepreneurPulse.com.au  Fact Sheet for Healthcare Providers: IncredibleEmployment.be  This test is not yet approved or cleared by the [place] FDA and has been authorized for detection and/or diagnosis of SARS-CoV-2 by FDA under an Emergency Use Authorization (EUA). This EUA will remain in effect (meaning this test can be used) for the duration of the COVID-19 declaration under Section 564(b)(1) of the Act, 21 U.S.C. section 360bbb-3(b)(1), unless the authorization is terminated or revoked.  Performed at [facility], [place] [address] [place], [place] [address]   Resp Panel by RT-PCR (Flu A&B, Covid) Nasopharyngeal Swab     Status: None   Collection Time: 09/17/20  8:44 PM   Specimen: Nasopharyngeal Swab; Nasopharyngeal(NP) swabs in vial transport medium  Result Value Ref Range Status   SARS Coronavirus 2 by RT PCR NEGATIVE NEGATIVE Final    Comment: (NOTE) SARS-CoV-2 target nucleic acids are NOT DETECTED.  The SARS-CoV-2 RNA is generally detectable in upper respiratory specimens during the acute phase of infection. The lowest concentration of SARS-CoV-2 viral copies this assay can detect is 138 copies/mL. A negative result does not preclude SARS-Cov-2 infection and should not be used as the sole basis for treatment or other patient  management decisions. A negative result may occur with  improper specimen collection/handling, submission of specimen other than nasopharyngeal swab, presence of viral mutation(s) within the areas targeted by this assay, and inadequate number of viral copies(<138 copies/mL). A negative result must be combined with clinical observations, patient history, and epidemiological information. The expected result is Negative.  Fact Sheet for Patients:  EntrepreneurPulse.com.au  Fact Sheet for Healthcare Providers:  IncredibleEmployment.be  This test is no t yet approved or cleared by the [place] FDA and  has been authorized for detection and/or diagnosis of SARS-CoV-2 by FDA under an Emergency Use Authorization (EUA). This EUA will remain  in effect (meaning this test can be used) for the duration of the COVID-19 declaration under Section 564(b)(1) of the Act, 21 U.S.C.section 360bbb-3(b)(1), unless the authorization is terminated  or revoked sooner.       Influenza A by PCR NEGATIVE NEGATIVE Final   Influenza B by PCR NEGATIVE NEGATIVE Final    Comment: (NOTE) The Xpert Xpress SARS-CoV-2/FLU/RSV plus assay is intended as an aid in the diagnosis of influenza from Nasopharyngeal swab specimens and should not be used as a sole basis for treatment. Nasal washings and aspirates are unacceptable for Xpert Xpress SARS-CoV-2/FLU/RSV testing.  Fact Sheet for Patients: EntrepreneurPulse.com.au  Fact Sheet for Healthcare Providers: IncredibleEmployment.be  This test is not yet approved or cleared by the [place] FDA and has been authorized for detection and/or diagnosis of SARS-CoV-2 by FDA under an Emergency Use Authorization (EUA). This EUA will remain in effect (meaning this test can be used) for the duration of the COVID-19 declaration under Section 564(b)(1) of the Act, 21 U.S.C. section 360bbb-3(b)(1),  unless the authorization is terminated or revoked.  Performed at [facility], [place] [address] [place], [place] [address]   MRSA PCR Screening  Status: Abnormal   Collection Time: 09/19/20 12:24 AM   Specimen: Nasal Mucosa; Nasopharyngeal  Result Value Ref Range Status   MRSA by PCR POSITIVE (A) NEGATIVE Final    Comment:        The GeneXpert MRSA Assay (FDA approved for NASAL specimens only), is one component of a comprehensive MRSA colonization surveillance program. It is not intended to diagnose MRSA infection nor to guide or monitor treatment for MRSA infections. RESULT CALLED TO, READ BACK BY AND VERIFIED WITHFrankey Poot RN 09/19/20 9444 JDW Performed at [facility], [place] [address] [place], [place] [address]     RADIOLOGY STUDIES/RESULTS: DG Foot Complete Left  Result Date: 09/22/2020 CLINICAL DATA:  Chronic left foot pain, worsening, no known injury EXAM: LEFT FOOT - COMPLETE 3+ VIEW COMPARISON:  None. FINDINGS: There is no evidence of fracture or dislocation. There is no evidence of arthropathy or other focal bone abnormality. Vascular calcinosis. Diffuse soft tissue edema about the foot. IMPRESSION: 1. No fracture or dislocation of the left foot. Joint spaces are well preserved. 2. Diffuse soft tissue edema about the foot. Electronically Signed   By: Eddie Candle M.D.   On: 09/22/2020 09:50     LOS: 5 days   Lala Lund, MD  [facility]  09/22/2020, 10:09 AM

## 2020-09-22 NOTE — Consult Note (Signed)
[facility] Nurse Consult Note: Reason for Consult: Second consult for right groin wound received. Healing surgical wound, right groin. Patient complaining of pain in the area. This is not a new complaint, patient was also complaining of tenderness in the area with no overt signs of infection or changes on 09/18/20. See notes by Dr. Gala Lewandowsky and my associate S. Tora Perches and a photo taken on that visit.  Wound type:Surgical Pressure Injury POA: N/A Wound continues to heal without obvious complications, i.e.,  there is no induration, erythema, increase in drainage and she continues with decreasing wound dimensions. Recommend consultation for assessment by surgical team for complaints of discomfort in this area, if desired as scar tissue/adhesions or another reason for pain could be present and not apparent to wound care nurse.  I have communicated this recommendation to Dr. Ronnie Derby via [place].  [facility] nursing team will not follow, but will remain available to this patient, the nursing and medical teams.  Please re-consult if needed. Thanks, Maudie Flakes, MSN, RN, [place], Arther Abbott  Pager# [phone]

## 2020-09-22 NOTE — Consult Note (Signed)
WOC Nurse Consult Note: Consult received for right groin wound, chronic, nonhealing.  Patient seen for this wound 3 days ago by my partner, S. Doty. See her note from that encounter dated 09/19/20.  Orders on the chart are current and appropriate.  Patient not seen today.  [facility] nursing team will not follow, but will remain available to this patient, the nursing and medical teams.  Please re-consult if needed. Thanks, Maudie Flakes, MSN, RN, [place], Arther Abbott  Pager# [phone]

## 2020-09-22 NOTE — Progress Notes (Addendum)
HOSPITAL MEDICINE OVERNIGHT EVENT NOTE    Notified by nursing to use complaining of severe left foot pain.  This is associated with swelling without significant redness or warmth.  Notes reviewed, it seems that this was already assessed by day provider.  Due to history of gout, dose of steroids was administered.  X-[place] was also performed which revealed no evidence of fracture but did reveal diffuse foot edema.  Uric acid levels also obtained and was found to be normal making me question the diagnosis of gout.  Patient states steroid did not result in improvement in pain.  While patient does have end-stage renal disease, there is good evidence to show that limited use of colchicine is safe as long as it is monitored closely.  Will provide 0.6 mg of colchicine x1 although if this does not improve patient's symptoms will avoid further use until patient is reassessed tomorrow by day provider.  Vernelle Emerald  MD [facility]   ADDENDUM (3/14 4:10AM)  Colchicine not effective.  Nursing reports patient complaining of continued severe pain of the left foot with ongoing nonpitting edema.  No evidence of erythema.  Good pulse present.  Will provide patient with a one-time dose of Percocet.  Sherryll Burger Nyia Tsao

## 2020-09-23 ENCOUNTER — Encounter (HOSPITAL_COMMUNITY): Payer: Self-pay | Admitting: Gastroenterology

## 2020-09-23 DIAGNOSIS — K922 Gastrointestinal hemorrhage, unspecified: Secondary | ICD-10-CM | POA: Diagnosis not present

## 2020-09-23 LAB — BPAM RBC
Blood Product Expiration Date: 202204082359
ISSUE DATE / TIME: 202203131742
Unit Type and Rh: 6200

## 2020-09-23 LAB — BASIC METABOLIC PANEL
Anion gap: 13 (ref 5–15)
BUN: 21 mg/dL (ref 8–23)
CO2: 24 mmol/L (ref 22–32)
Calcium: 8.3 mg/dL — ABNORMAL LOW (ref 8.9–10.3)
Chloride: 96 mmol/L — ABNORMAL LOW (ref 98–111)
Creatinine, Ser: 4.88 mg/dL — ABNORMAL HIGH (ref 0.44–1.00)
GFR, Estimated: 10 mL/min — ABNORMAL LOW (ref >60)
Glucose, Bld: 258 mg/dL — ABNORMAL HIGH (ref 70–99)
Potassium: 3.5 mmol/L (ref 3.5–5.1)
Sodium: 133 mmol/L — ABNORMAL LOW (ref 135–145)

## 2020-09-23 LAB — CBC
HCT: 27.2 % — ABNORMAL LOW (ref 36.0–46.0)
Hemoglobin: 8.8 g/dL — ABNORMAL LOW (ref 12.0–15.0)
MCH: 30 pg (ref 26.0–34.0)
MCHC: 32.4 g/dL (ref 30.0–36.0)
MCV: 92.8 fL (ref 80.0–100.0)
Platelets: 193 10*3/uL (ref 150–400)
RBC: 2.93 MIL/uL — ABNORMAL LOW (ref 3.87–5.11)
RDW: 17.1 % — ABNORMAL HIGH (ref 11.5–15.5)
WBC: 6 10*3/uL (ref 4.0–10.5)
nRBC: 0.3 % — ABNORMAL HIGH (ref 0.0–0.2)

## 2020-09-23 LAB — GLUCOSE, CAPILLARY
Glucose-Capillary: 134 mg/dL — ABNORMAL HIGH (ref 70–99)
Glucose-Capillary: 149 mg/dL — ABNORMAL HIGH (ref 70–99)
Glucose-Capillary: 185 mg/dL — ABNORMAL HIGH (ref 70–99)
Glucose-Capillary: 188 mg/dL — ABNORMAL HIGH (ref 70–99)
Glucose-Capillary: 213 mg/dL — ABNORMAL HIGH (ref 70–99)
Glucose-Capillary: 224 mg/dL — ABNORMAL HIGH (ref 70–99)

## 2020-09-23 LAB — TYPE AND SCREEN
ABO/RH(D): A POS
Antibody Screen: NEGATIVE
Unit division: 0

## 2020-09-23 LAB — SURGICAL PATHOLOGY

## 2020-09-23 LAB — MAGNESIUM: Magnesium: 1.8 mg/dL (ref 1.7–2.4)

## 2020-09-23 LAB — BRAIN NATRIURETIC PEPTIDE: B Natriuretic Peptide: 108.7 pg/mL — ABNORMAL HIGH (ref 0.0–100.0)

## 2020-09-23 MED ORDER — PANTOPRAZOLE SODIUM 40 MG PO TBEC
40.0000 mg | DELAYED_RELEASE_TABLET | Freq: Two times a day (BID) | ORAL | Status: DC
  Administered 2020-09-23 – 2020-09-24 (×2): 40 mg via ORAL
  Filled 2020-09-23 (×2): qty 1

## 2020-09-23 MED ORDER — COLCHICINE 0.6 MG PO TABS
0.6000 mg | ORAL_TABLET | Freq: Every day | ORAL | Status: DC
  Administered 2020-09-23 – 2020-09-24 (×2): 0.6 mg via ORAL
  Filled 2020-09-23 (×2): qty 1

## 2020-09-23 MED ORDER — OXYCODONE-ACETAMINOPHEN 5-325 MG PO TABS
1.0000 | ORAL_TABLET | Freq: Once | ORAL | Status: AC
Start: 2020-09-23 — End: 2020-09-23
  Administered 2020-09-23: 1 via ORAL
  Filled 2020-09-23: qty 1

## 2020-09-23 NOTE — Progress Notes (Signed)
Occupational Therapy Treatment Patient Details Name: [name] MRN: [id number] DOB: March 23, 1956 Today's Date: 09/23/2020    History of present illness 65 y.o. female presenting with fatigue, nausea during hemodialysis, and dark tarry colostomy output for 2 days. In ED found to have Hgb of 6.4 provided 1 unit PRBC. Admitted 09/19/20 for Upper GI bleeding with symptomatic anemia, hyperosmolar hyperglycemia, and hyperglycemia. Pt received  PMH: coronary artery disease, insulin-dependent diabetes mellitus, chronic anemia, hepatitis C, depression, anxiety, ESRD on hemodialysis, and necrotizing fasciitis status post multiple debridements and colostomy in January.   OT comments  Pt progressing towards established OT goals. Pt performing functional mobility in hallway with Supervision-Min Guard A using RW; also using rollator and pt reporting she feels comfortable with rollator. Providing education and handout on AE for LB dressing; pt verbalized understanding and reports she has already ordered AE. Continue to recommend dc to home with HHOT and will continue to follow acutely as admitted.    Follow Up Recommendations  Home health OT;Supervision/Assistance - 24 hour    Equipment Recommendations  None recommended by OT    Recommendations for Other Services PT consult    Precautions / Restrictions Precautions Precautions: Fall Precaution Comments: ostomy       Mobility Bed Mobility               General bed mobility comments: In recliner upon arrival    Transfers Overall transfer level: Needs assistance Equipment used: None Transfers: Sit to/from Stand Sit to Stand: Min guard         General transfer comment: Min Guard A for safety and increased time    Balance Overall balance assessment: Needs assistance Sitting-balance support: No upper extremity supported;Feet supported Sitting balance-Leahy Scale: Fair     Standing balance support: No upper extremity  supported;During functional activity Standing balance-Leahy Scale: Fair                             ADL either performed or assessed with clinical judgement   ADL Overall ADL's : Needs assistance/impaired                       Lower Body Dressing Details (indicate cue type and reason): Providing education on AE (hand out provided) for LB dressing. Pt verbalized understanding Toilet Transfer: Min guard;Ambulation Toilet Transfer Details (indicate cue type and reason): Min Guard A for safety         Functional mobility during ADLs: Min guard;Rolling walker;Supervision/safety General ADL Comments: Pt performing mobility in hallway with Min Guard-Supervision demonstrating increased activity tolerance     Vision       Perception     Praxis      Cognition Arousal/Alertness: Awake/alert Behavior During Therapy: WFL for tasks assessed/performed Overall Cognitive Status: Within Functional Limits for tasks assessed                                          Exercises     Shoulder Instructions       General Comments VSS    Pertinent Vitals/ Pain       Pain Assessment: No/denies pain  Home Living  Prior Functioning/Environment              Frequency  Min 2X/week        Progress Toward Goals  OT Goals(current goals can now be found in the care plan section)  Progress towards OT goals: Progressing toward goals  Acute Rehab OT Goals Patient Stated Goal: Go home OT Goal Formulation: With patient Time For Goal Achievement: 10/04/20 Potential to Achieve Goals: Good ADL Goals Pt Will Perform Grooming: with modified independence;standing Pt Will Perform Lower Body Dressing: with modified independence;sit to/from stand Pt Will Transfer to Toilet: with modified independence;ambulating;bedside commode Additional ADL Goal #1: Pt will independently verbalize three energy  conservation techniques for ADLs  Plan Discharge plan remains appropriate    Co-evaluation    PT/OT/SLP Co-Evaluation/Treatment: Yes Reason for Co-Treatment: For patient/therapist safety;To address functional/ADL transfers   OT goals addressed during session: ADL's and self-care      AM-PAC OT "6 Clicks" Daily Activity     Outcome Measure   Help from another person eating meals?: None Help from another person taking care of personal grooming?: A Little Help from another person toileting, which includes using toliet, bedpan, or urinal?: A Little Help from another person bathing (including washing, rinsing, drying)?: A Little Help from another person to put on and taking off regular upper body clothing?: None Help from another person to put on and taking off regular lower body clothing?: A Little 6 Click Score: 20    End of Session Equipment Utilized During Treatment: Rolling walker  OT Visit Diagnosis: Unsteadiness on feet (R26.81);Other abnormalities of gait and mobility (R26.89);Muscle weakness (generalized) (M62.81)   Activity Tolerance Patient tolerated treatment well   Patient Left in chair;with call bell/phone within reach   Nurse Communication Mobility status        Time: 4920-1007 OT Time Calculation (min): 25 min  Charges: OT General Charges $OT Visit: 1 Visit OT Treatments $Self Care/Home Management : 8-22 mins $Therapeutic Activity: 8-22 mins  Arrie Borrelli MSOT, OTR/L Acute Rehab Pager: [phone] Office: [place] 09/23/2020, 1:42 PM

## 2020-09-23 NOTE — Progress Notes (Signed)
PROGRESS NOTE        PATIENT DETAILS Name: [name] Age: 65 y.o. Sex: female Date of Birth: 04-Mar-1956 Admit Date: 09/17/2020 Admitting Physician Vianne Bulls, MD GYI:RSWNIOEV, Clearnce Sorrel, PA-C  Brief Narrative: Patient is a 65 y.o. female ESRD, history of peroneal necrotizing fasciitis s/p colostomy January 2022, DM-2, anemia, depression, anxiety-referred from hemodialysis center for evaluation of weakness and black tarry stools in her ostomy.  She was found to have acute blood loss anemia and hyperosmolar hyperglycemic state-she was transfused PRBC, started on insulin infusion and subsequently transferred to [facility].    Significant events: 3/8>> admit for black stools in ostomy with acute blood loss anemia and hyperosmolar hyperglycemic state requiring IV insulin.  Significant studies: 3/8>> chest x-Statler: No acute abnormality 3/11>> EGD: Multiple nonbleeding ulcers in duodenum  Antimicrobial therapy: None  Microbiology data: None  Procedures : 3/11>> EGD: Multiple nonbleeding ulcers in duodenum  Consults: GI, ESRD  DVT Prophylaxis : SCDs Start: 09/17/20 2246   Subjective:  Patient in bed, appears comfortable, denies any headache, no fever, no chest pain or pressure, no shortness of breath , no abdominal pain. No focal weakness.  Improved left buttock and right groin discomfort.   Assessment/Plan:  Probable upper GI bleeding with acute blood loss anemia: No further black stool in ostomy-brown stools seen this morning-hemoglobin has stabilized after total of 5 units of PRBC transfusion last transfusion on 09/22/2020 by renal.  GI on board underwent EGD showing nonbleeding duodenal ulcers, continue PPI and monitoring H&H.  If stable likely discharge on 09/24/2020, PPI and 2 weeks total of Carafate upon discharge.  ESRD: On HD-nephrology following and directing care, TTS.  CAD: Without any anginal symptoms-aspirin on hold due to GI  bleeding-resume when able  HTN: BP remains stable-Coreg/hydralazine remains on hold.  Resume when able.    COPD: Not in exacerbation-continue bronchodilators  History of necrotizing fasciitis involving perineum-s/p multiple debridements-subsequent placement of a colostomy in January 2022, has a chronic right groin wound for which wound care consulted.  Left foot pain for 2 to 3 weeks.  History of gout, stable Uric acid still good out, improved after colchicine, single dose IV steroid and supportive care.  Continue to monitor, will have her follow with orthopedics outpatient.  Prolonged QTC: Continue telemetry monitoring-follow K/Mg-avoid QTC prolonging agents.    Hyperosmolar hyperglycemic state: Treated with IV insulin-has been transitioned to SQ insulin.  Insulin-dependent DM-2 (A1c 8.4 on 3/4): CBGs relatively stable-continue Levemir 30 units twice daily-SSI-reassess on 3/12 and adjust accordingly.  Recent Labs    09/22/20 2339 09/23/20 0503 09/23/20 0755  GLUCAP 332* 149* 134*   Lab Results  Component Value Date   HGBA1C 8.4 (H) 09/13/2020     Morbid Obesity: Estimated body mass index is 42.24 kg/m as calculated from the following:   Height as of this encounter: 5\' 8"  (1.727 m).   Weight as of this encounter: 126 kg.    Diet: Diet Order            Diet renal with fluid restriction Fluid restriction: 1200 mL Fluid; Room service appropriate? Yes; Fluid consistency: Thin  Diet effective now                  Code Status: Full code   Family Communication: Spoke with daughter-Jazzslyn Moren over [phone]-on 09/23/20 12:20 PM  Disposition Plan: Status is: Inpatient  Remains inpatient appropriate because:Inpatient level of care appropriate due to severity of illness   Dispo: The patient is from: Home              Anticipated d/c is to: Home              Patient currently is not medically stable to d/c.   Difficult to place patient No   Barriers to  Discharge: Ongoing upper GI bleeding-awaiting endoscopy evaluation-needing frequent CBC monitoring.  Antimicrobial agents: Anti-infectives (From admission, onward)   None       Time spent: 25 minutes-Greater than 50% of this time was spent in counseling, explanation of diagnosis, planning of further management, and coordination of care.  MEDICATIONS: Scheduled Meds: . (feeding supplement) PROSource Plus  30 mL Oral BID BM  . Chlorhexidine Gluconate Cloth  6 each Topical Q0600  . colchicine  0.6 mg Oral Daily  . insulin aspart  0-9 Units Subcutaneous Q4H  . insulin detemir  30 Units Subcutaneous BID  . mometasone-formoterol  2 puff Inhalation BID  . mupirocin ointment  1 application Nasal BID  . pantoprazole  40 mg Oral BID  . sucralfate  1 g Oral TID WC & HS   Continuous Infusions:  PRN Meds:.acetaminophen, albuterol, dextrose   PHYSICAL EXAM: Vital signs: Vitals:   09/22/20 2349 09/23/20 0458 09/23/20 0503 09/23/20 0801  BP: (!) 168/60 132/80  (!) 138/52  Pulse: 95 89 93 86  Resp: 17 17 13 10   Temp: 98.1 F (36.7 C) 97.7 F (36.5 C)  97.7 F (36.5 C)  TempSrc: Oral Oral  Oral  SpO2: 98%  96% 95%  Weight:   126 kg   Height:       Filed Weights   09/21/20 1150 09/22/20 0435 09/23/20 0503  Weight: 124.5 kg 125.5 kg 126 kg   Body mass index is 42.24 kg/m.   Gen Exam:  Awake Alert, No new F.N deficits, Normal affect [place].AT,PERRAL Supple Neck,No JVD, No cervical lymphadenopathy appriciated.  Symmetrical Chest wall movement, Good air movement bilaterally, CTAB RRR,No Gallops, Rubs or new Murmurs, No Parasternal Heave +ve B.Sounds, Abd Soft, No tenderness, No organomegaly appriciated, No rebound - guarding or rigidity. healing right groin wound, colostomy in place, L foot no redness or warmth   I have personally reviewed following labs and imaging studies  LABORATORY DATA: CBC: Recent Labs  Lab 09/17/20 2020 09/18/20 1046 09/20/20 1811 09/21/20 0437  09/21/20 1815 09/22/20 0113 09/23/20 0120  WBC 8.1   < > 8.8 7.0 7.3 5.5 6.0  NEUTROABS 4.4  --   --   --   --   --   --   HGB 6.4*   < > 8.0* 7.7* 7.6* 7.2* 8.8*  HCT 20.1*   < > 23.8* 23.8* 23.9* 21.9* 27.2*  MCV 100.0   < > 91.2 93.3 93.7 93.2 92.8  PLT 237   < > 213 204 183 175 193   < > = values in this interval not displayed.    Basic Metabolic Panel: Recent Labs  Lab 09/19/20 0344 09/19/20 0713 09/20/20 0104 09/21/20 0437 09/22/20 0113 09/23/20 0120  NA 137 137 135 134* 135 133*  K 4.6 4.6 4.2 3.7 3.5 3.5  CL 99 100 98 97* 100 96*  CO2 24 25 26 26 27 24   GLUCOSE 177* 215* 228* 182* 178* 258*  BUN 48* 47* 56* 26* 14 21  CREATININE 4.33* 4.57* 5.34* 4.21* 3.58*  4.88*  CALCIUM 8.5* 8.2* 8.3* 8.5* 8.1* 8.3*  MG  --   --  2.0  --  1.8 1.8  PHOS 3.6  --  5.2* 4.3 3.8  --     GFR: Estimated Creatinine Clearance: 16.7 mL/min (A) (by C-G formula based on SCr of 4.88 mg/dL (H)).  Liver Function Tests: Recent Labs  Lab 09/17/20 2020 09/19/20 0344 09/20/20 0104 09/21/20 0437 09/22/20 0113  AST 46*  --   --   --   --   ALT 36  --   --   --   --   ALKPHOS 326*  --   --   --   --   BILITOT 0.7  --   --   --   --   PROT 5.6*  --   --   --   --   ALBUMIN 2.5* 2.5* 2.2* 2.5* 2.5*   Recent Labs  Lab 09/17/20 2020  LIPASE 60*   No results for input(s): AMMONIA in the last 168 hours.  Coagulation Profile: Recent Labs  Lab 09/17/20 2020  INR 1.1    Cardiac Enzymes: No results for input(s): CKTOTAL, CKMB, CKMBINDEX, TROPONINI in the last 168 hours.  BNP (last 3 results) No results for input(s): PROBNP in the last 8760 hours.  Lipid Profile: No results for input(s): CHOL, HDL, LDLCALC, TRIG, CHOLHDL, LDLDIRECT in the last 72 hours.  Thyroid Function Tests: No results for input(s): TSH, T4TOTAL, FREET4, T3FREE, THYROIDAB in the last 72 hours.  Anemia Panel: No results for input(s): VITAMINB12, FOLATE, FERRITIN, TIBC, IRON, RETICCTPCT in the last 72  hours.  Urine analysis:    Component Value Date/Time   COLORURINE AMBER (A) 12/13/2019 1258   APPEARANCEUR CLOUDY (A) 12/13/2019 1258   LABSPEC 1.017 12/13/2019 1258   PHURINE 5.0 12/13/2019 1258   GLUCOSEU NEGATIVE 12/13/2019 1258   HGBUR NEGATIVE 12/13/2019 1258   BILIRUBINUR NEGATIVE 12/13/2019 1258   BILIRUBINUR neg 12/17/2016 1538   KETONESUR NEGATIVE 12/13/2019 1258   PROTEINUR 30 (A) 12/13/2019 1258   UROBILINOGEN 0.2 12/17/2016 1538   UROBILINOGEN 0.2 05/29/2010 2100   NITRITE NEGATIVE 12/13/2019 1258   LEUKOCYTESUR LARGE (A) 12/13/2019 1258    Sepsis Labs: Lactic Acid, Venous    Component Value Date/Time   LATICACIDVEN 3.2 (HH) 09/18/2020 0544    MICROBIOLOGY: Recent Results (from the past 240 hour(s))  Resp Panel by RT-PCR (Flu A&B, Covid) Nasopharyngeal Swab     Status: None   Collection Time: 09/17/20  8:44 PM   Specimen: Nasopharyngeal Swab; Nasopharyngeal(NP) swabs in vial transport medium  Result Value Ref Range Status   SARS Coronavirus 2 by RT PCR NEGATIVE NEGATIVE Final    Comment: (NOTE) SARS-CoV-2 target nucleic acids are NOT DETECTED.  The SARS-CoV-2 RNA is generally detectable in upper respiratory specimens during the acute phase of infection. The lowest concentration of SARS-CoV-2 viral copies this assay can detect is 138 copies/mL. A negative result does not preclude SARS-Cov-2 infection and should not be used as the sole basis for treatment or other patient management decisions. A negative result may occur with  improper specimen collection/handling, submission of specimen other than nasopharyngeal swab, presence of viral mutation(s) within the areas targeted by this assay, and inadequate number of viral copies(<138 copies/mL). A negative result must be combined with clinical observations, patient history, and epidemiological information. The expected result is Negative.  Fact Sheet for Patients:   EntrepreneurPulse.com.au  Fact Sheet for Healthcare Providers:  IncredibleEmployment.be  This test is  no t yet approved or cleared by the [place] and  has been authorized for detection and/or diagnosis of SARS-CoV-2 by FDA under an Emergency Use Authorization (EUA). This EUA will remain  in effect (meaning this test can be used) for the duration of the COVID-19 declaration under Section 564(b)(1) of the Act, 21 U.S.C.section 360bbb-3(b)(1), unless the authorization is terminated  or revoked sooner.       Influenza A by PCR NEGATIVE NEGATIVE Final   Influenza B by PCR NEGATIVE NEGATIVE Final    Comment: (NOTE) The Xpert Xpress SARS-CoV-2/FLU/RSV plus assay is intended as an aid in the diagnosis of influenza from Nasopharyngeal swab specimens and should not be used as a sole basis for treatment. Nasal washings and aspirates are unacceptable for Xpert Xpress SARS-CoV-2/FLU/RSV testing.  Fact Sheet for Patients: EntrepreneurPulse.com.au  Fact Sheet for Healthcare Providers: IncredibleEmployment.be  This test is not yet approved or cleared by the [place] FDA and has been authorized for detection and/or diagnosis of SARS-CoV-2 by FDA under an Emergency Use Authorization (EUA). This EUA will remain in effect (meaning this test can be used) for the duration of the COVID-19 declaration under Section 564(b)(1) of the Act, 21 U.S.C. section 360bbb-3(b)(1), unless the authorization is terminated or revoked.  Performed at [facility], [place] [address] [place], [place] [address]   MRSA PCR Screening     Status: Abnormal   Collection Time: 09/19/20 12:24 AM   Specimen: Nasal Mucosa; Nasopharyngeal  Result Value Ref Range Status   MRSA by PCR POSITIVE (A) NEGATIVE Final    Comment:        The GeneXpert MRSA Assay (FDA approved for NASAL specimens only), is one component of a comprehensive  MRSA colonization surveillance program. It is not intended to diagnose MRSA infection nor to guide or monitor treatment for MRSA infections. RESULT CALLED TO, READ BACK BY AND VERIFIED WITHFrankey Poot RN 09/19/20 8469 JDW Performed at [facility], [place] [address], [place], [place] [address]     RADIOLOGY STUDIES/RESULTS: DG Foot Complete Left  Result Date: 09/22/2020 CLINICAL DATA:  Chronic left foot pain, worsening, no known injury EXAM: LEFT FOOT - COMPLETE 3+ VIEW COMPARISON:  None. FINDINGS: There is no evidence of fracture or dislocation. There is no evidence of arthropathy or other focal bone abnormality. Vascular calcinosis. Diffuse soft tissue edema about the foot. IMPRESSION: 1. No fracture or dislocation of the left foot. Joint spaces are well preserved. 2. Diffuse soft tissue edema about the foot. Electronically Signed   By: Eddie Candle M.D.   On: 09/22/2020 09:50     LOS: 6 days   Lala Lund, MD  [facility]  09/23/2020, 12:17 PM

## 2020-09-23 NOTE — Progress Notes (Signed)
Renal Navigator appreciates call from Renal NP/R. Owens Shark who states understanding of plan to be for discharge tomorrow, which is an HD day. Navigator spoke with Attending/Dr. Candiss Norse, clinic/SW and patient to discuss possibility of am discharge and HD at outpatient clinic tomorrow, Tuesday, 09/24/20.  [facility] patient at 11:00am tomorrow. Ideally, she should arrive at 10:40am, but they will provide treatment even if she is late since she is coming after hospital discharge. Attending agreeable and confirms am discharge. Patient is agreeable to going to outpatient HD tomorrow and states that her daughter can provide transportation, but cannot wait too long for her since she will be leaving work to take her. Patient declined offer for [employer]. She wants her daughter to take her, but was appreciative of the offer. Renal NP updated and will send outpatient orders.  HD will be provided in the hospital if for some reason patient is not stable for discharge tomorrow. Otherwise, patient will NOT have HD prior to discharge on 09/24/20 and is set up for HD at outpatient clinic after discharge. Navigator appreciates team collaboration.  Alphonzo Cruise, [place] Renal Navigator [phone]

## 2020-09-23 NOTE — Progress Notes (Addendum)
[facility] Progress Note   Subjective: Seen in room, working with OT. Looks and feels much better. Discharge tomorrow. No C/Os.   Objective Vitals:   09/22/20 2349 09/23/20 0458 09/23/20 0503 09/23/20 0801  BP: (!) 168/60 132/80  (!) 138/52  Pulse: 95 89 93 86  Resp: 17 17 13 10   Temp: 98.1 F (36.7 C) 97.7 F (36.5 C)  97.7 F (36.5 C)  TempSrc: Oral Oral  Oral  SpO2: 98%  96% 95%  Weight:   126 kg   Height:       Physical Exam General:Pleasant obese female in NAD Heart:RRR No m,r,g  Lungs:Slightly decreased in bases otherwise CTAB.  Abdomen:Obese, NT, active BS. Ostomy LLQ  Extremities:Trace LE edema  Dialysis Access:L AVF + bruit.   Additional Objective Labs: Basic Metabolic Panel: Recent Labs  Lab 09/20/20 0104 09/21/20 0437 09/22/20 0113 09/23/20 0120  NA 135 134* 135 133*  K 4.2 3.7 3.5 3.5  CL 98 97* 100 96*  CO2 26 26 27 24   GLUCOSE 228* 182* 178* 258*  BUN 56* 26* 14 21  CREATININE 5.34* 4.21* 3.58* 4.88*  CALCIUM 8.3* 8.5* 8.1* 8.3*  PHOS 5.2* 4.3 3.8  --    Liver Function Tests: Recent Labs  Lab 09/17/20 2020 09/19/20 0344 09/20/20 0104 09/21/20 0437 09/22/20 0113  AST 46*  --   --   --   --   ALT 36  --   --   --   --   ALKPHOS 326*  --   --   --   --   BILITOT 0.7  --   --   --   --   PROT 5.6*  --   --   --   --   ALBUMIN 2.5*   < > 2.2* 2.5* 2.5*   < > = values in this interval not displayed.   Recent Labs  Lab 09/17/20 2020  LIPASE 60*   CBC: Recent Labs  Lab 09/17/20 2020 09/18/20 1046 09/20/20 1811 09/21/20 0437 09/21/20 1815 09/22/20 0113 09/23/20 0120  WBC 8.1   < > 8.8 7.0 7.3 5.5 6.0  NEUTROABS 4.4  --   --   --   --   --   --   HGB 6.4*   < > 8.0* 7.7* 7.6* 7.2* 8.8*  HCT 20.1*   < > 23.8* 23.8* 23.9* 21.9* 27.2*  MCV 100.0   < > 91.2 93.3 93.7 93.2 92.8  PLT 237   < > 213 204 183 175 193   < > = values in this interval not displayed.   Blood Culture    Component Value Date/Time    SDES BLOOD RIGHT FA 12/28/2018 2315   SPECREQUEST  12/28/2018 2315    BOTTLES DRAWN AEROBIC AND ANAEROBIC Blood Culture adequate volume   CULT  12/28/2018 2315    NO GROWTH 5 DAYS Performed at [facility], [place] [place], [place] [address]    REPTSTATUS 01/02/2019 FINAL 12/28/2018 2315    Cardiac Enzymes: No results for input(s): CKTOTAL, CKMB, CKMBINDEX, TROPONINI in the last 168 hours. CBG: Recent Labs  Lab 09/22/20 1645 09/22/20 1945 09/22/20 2339 09/23/20 0503 09/23/20 0755  GLUCAP 348* 341* 332* 149* 134*   Iron Studies: No results for input(s): IRON, TIBC, TRANSFERRIN, FERRITIN in the last 72 hours. @lablastinr3 @ Studies/Results: DG Foot Complete Left  Result Date: 09/22/2020 CLINICAL DATA:  Chronic left foot pain, worsening, no known injury EXAM: LEFT FOOT -  COMPLETE 3+ VIEW COMPARISON:  None. FINDINGS: There is no evidence of fracture or dislocation. There is no evidence of arthropathy or other focal bone abnormality. Vascular calcinosis. Diffuse soft tissue edema about the foot. IMPRESSION: 1. No fracture or dislocation of the left foot. Joint spaces are well preserved. 2. Diffuse soft tissue edema about the foot. Electronically Signed   By: Eddie Candle M.D.   On: 09/22/2020 09:50   Medications:  . (feeding supplement) PROSource Plus  30 mL Oral BID BM  . Chlorhexidine Gluconate Cloth  6 each Topical Q0600  . colchicine  0.6 mg Oral Daily  . insulin aspart  0-9 Units Subcutaneous Q4H  . insulin detemir  30 Units Subcutaneous BID  . mometasone-formoterol  2 puff Inhalation BID  . mupirocin ointment  1 application Nasal BID  . pantoprazole  40 mg Oral BID  . sucralfate  1 g Oral TID WC & HS     HD orders: [facility] T,Th,S 4 hrs 180NRe 400/500 126.5 kg 2.0K/2.0 Ca L AVF -Heparin 6000 units IV TIW(DC upon Discharge) -Hectorol 4 mcg IV TIW -Sensipar 30 mg PO TIW -Mircera 75 mcg IV q 2 weeks (last HGB on DC 09/14/20  9.4)  Assessment/Plan: 1.UGI bleed-Hgb 6.4 on admit. HGB 8.8 today.   S/P 5 units prbcs. Last 3/ 13/22.  Follow Hb trend.  Upper endoscopy 3/11 with non-bleeding duodenal ulcers. Protonix, Carafate started per GI. Carafate should be used for limited duration in ESRD patient.  2. HHS/DMT2 - HHS resolved. Insulin per primary  3. ESRD - HD TTS. Next HD 3/15  Hold heparin.  3. Anemia - As above. On ESA as outpatient, last dose 3/8.  4. Secondary hyperparathyroidism - Continue sensipar, VDRA. 5. HTN/volume - BP/volume up on admit. Improved with HD. Now at EDW. UF as tolerated.   6. Nutrition - Low Albumin. Renal/Carb mod diet. Protein supps  7. H/O CAD 8. H/O Necrotizing fasciitis R groin with diverting ostomy. S/P multiple debridements. Per primary.  9. L Foot Pain-possible gout. Per primary.   Disposition: DC home tomorrow. Discussed with Renal Navigator about getting HD at [facility] 2nd shift if possible.    Rita H. Brown NP-C 09/23/2020, 12:08 PM  [employer] [phone]

## 2020-09-23 NOTE — Progress Notes (Signed)
Physical Therapy Treatment Patient Details Name: [name] MRN: [id number] DOB: 12-16-1955 Today's Date: 09/23/2020    History of Present Illness 65 y.o. female presenting with fatigue, nausea during hemodialysis, and dark tarry colostomy output for 2 days. In ED found to have Hgb of 6.4 provided 1 unit PRBC. Admitted 09/19/20 for Upper GI bleeding with symptomatic anemia, hyperosmolar hyperglycemia, and hyperglycemia. Pt received  PMH: coronary artery disease, insulin-dependent diabetes mellitus, chronic anemia, hepatitis C, depression, anxiety, ESRD on hemodialysis, and necrotizing fasciitis status post multiple debridements and colostomy in January.    PT Comments    Pt noted to have ambulated with OT earlier in hallway, however has been up in chair since then with minimal movement because she is "so tired". Focus of session, education on exercise tolerance and progression of exercise to build endurance. Pt motivated by music so encouraged to incorporate into exercise. Pt grateful for education and performed LE exercise. D/c plans remain appropriate. PT will continue to follow acutely.    Follow Up Recommendations  Home health PT;Supervision for mobility/OOB     Equipment Recommendations  None recommended by PT       Precautions / Restrictions Precautions Precautions: Fall Precaution Comments: ostomy Restrictions Weight Bearing Restrictions: No    Mobility  Bed Mobility                    Transfers   Equipment used: None   Sit to Stand: Min guard         General transfer comment: In recliner upon arrival reports too much fatigue after walking with OT, eventually agrees to sit<>stand with min guard         Balance Overall balance assessment: Needs assistance Sitting-balance support: No upper extremity supported;Feet supported Sitting balance-Leahy Scale: Fair     Standing balance support: No upper extremity supported;During functional activity Standing  balance-Leahy Scale: Fair                              Cognition Arousal/Alertness: Awake/alert Behavior During Therapy: WFL for tasks assessed/performed Overall Cognitive Status: Within Functional Limits for tasks assessed                                        Exercises General Exercises - Lower Extremity Ankle Circles/Pumps: AROM;Both;10 reps;Seated Long Arc Quad: AROM;Both;10 reps;Seated Hip ABduction/ADduction: AROM;Both;10 reps;Seated Hip Flexion/Marching: AROM;Both;10 reps;Seated    General Comments General comments (skin integrity, edema, etc.): VSS, lengthy conversation about exercise tolerance and small bouts so that she will not be "too tired" to move again throughout the day. Pt report music is motivating for her. PT encouraged short bouts of exercise and dancing to progress endurance      Pertinent Vitals/Pain Pain Assessment: Faces Faces Pain Scale: Hurts little more Pain Location: low back Pain Descriptors / Indicators: Sore;Aching Pain Intervention(s): Limited activity within patient's tolerance;Monitored during session;Repositioned           PT Goals (current goals can now be found in the care plan section) Acute Rehab PT Goals Patient Stated Goal: Go home PT Goal Formulation: With patient Time For Goal Achievement: 10/04/20 Potential to Achieve Goals: Good Progress towards PT goals: Progressing toward goals    Frequency    Min 3X/week      PT Plan Current plan remains appropriate  AM-PAC PT "6 Clicks" Mobility   Outcome Measure  Help needed turning from your back to your side while in a flat bed without using bedrails?: None Help needed moving from lying on your back to sitting on the side of a flat bed without using bedrails?: None Help needed moving to and from a bed to a chair (including a wheelchair)?: None Help needed standing up from a chair using your arms (e.g., wheelchair or bedside chair)?:  None Help needed to walk in hospital room?: A Little Help needed climbing 3-5 steps with a railing? : A Lot 6 Click Score: 21    End of Session   Activity Tolerance: Patient limited by fatigue Patient left: in chair;with call bell/phone within reach Nurse Communication: Mobility status PT Visit Diagnosis: Other abnormalities of gait and mobility (R26.89);Muscle weakness (generalized) (M62.81)     Time: 0086-7619 PT Time Calculation (min) (ACUTE ONLY): 22 min  Charges:  $Therapeutic Exercise: 8-22 mins                     Ashli Selders B. Migdalia Dk PT, DPT Acute Rehabilitation Services Pager [phone] Office [phone]    [place] 09/23/2020, 5:58 PM

## 2020-09-23 NOTE — Progress Notes (Signed)
Inpatient Diabetes Program Recommendations  AACE/ADA: New Consensus Statement on Inpatient Glycemic Control (2015)  Target Ranges:  Prepandial:   less than 140 mg/dL      Peak postprandial:   less than 180 mg/dL (1-2 hours)      Critically ill patients:  140 - 180 mg/dL   Lab Results  Component Value Date   GLUCAP 134 (H) 09/23/2020   HGBA1C 8.4 (H) 09/13/2020    Review of Glycemic Control Results for [name], [name] (MRN [id number]) as of 09/23/2020 10:39  Ref. Range 09/22/2020 07:50 09/22/2020 11:44 09/22/2020 16:45 09/22/2020 19:45 09/22/2020 23:39 09/23/2020 05:03 09/23/2020 07:55  Glucose-Capillary Latest Ref Range: 70 - 99 mg/dL 129 (H) 198 (H) 348 (H) 341 (H) 332 (H) 149 (H) 134 (H)   Inpatient Diabetes Program Recommendations:   Noted postprandial CBGS elevated. -Add Novolog 3 units tid meal coverage if eats 50%  Thank you, Bethena Roys E. Mansa Willers, RN, MSN, CDE  Diabetes Coordinator Inpatient Glycemic Control Team Team Pager [phone] (8am-5pm) 09/23/2020 10:42 AM

## 2020-09-24 DIAGNOSIS — K922 Gastrointestinal hemorrhage, unspecified: Secondary | ICD-10-CM | POA: Diagnosis not present

## 2020-09-24 LAB — BASIC METABOLIC PANEL
Anion gap: 15 (ref 5–15)
BUN: 28 mg/dL — ABNORMAL HIGH (ref 8–23)
CO2: 20 mmol/L — ABNORMAL LOW (ref 22–32)
Calcium: 8 mg/dL — ABNORMAL LOW (ref 8.9–10.3)
Chloride: 96 mmol/L — ABNORMAL LOW (ref 98–111)
Creatinine, Ser: 5.76 mg/dL — ABNORMAL HIGH (ref 0.44–1.00)
GFR, Estimated: 8 mL/min — ABNORMAL LOW (ref >60)
Glucose, Bld: 226 mg/dL — ABNORMAL HIGH (ref 70–99)
Potassium: 4 mmol/L (ref 3.5–5.1)
Sodium: 131 mmol/L — ABNORMAL LOW (ref 135–145)

## 2020-09-24 LAB — BRAIN NATRIURETIC PEPTIDE: B Natriuretic Peptide: 48.6 pg/mL (ref 0.0–100.0)

## 2020-09-24 LAB — MAGNESIUM: Magnesium: 1.8 mg/dL (ref 1.7–2.4)

## 2020-09-24 LAB — GLUCOSE, CAPILLARY
Glucose-Capillary: 176 mg/dL — ABNORMAL HIGH (ref 70–99)
Glucose-Capillary: 57 mg/dL — ABNORMAL LOW (ref 70–99)
Glucose-Capillary: 126 mg/dL — ABNORMAL HIGH (ref 70–99)

## 2020-09-24 MED ORDER — ALLOPURINOL 100 MG PO TABS: 100.0000 mg | ORAL_TABLET | Freq: Every day | ORAL | Status: DC

## 2020-09-24 MED ORDER — PANTOPRAZOLE SODIUM 40 MG PO TBEC: 40.0000 mg | DELAYED_RELEASE_TABLET | Freq: Two times a day (BID) | ORAL | 2 refills | Status: DC

## 2020-09-24 MED ORDER — COLCHICINE 0.6 MG PO TABS: 0.6000 mg | ORAL_TABLET | ORAL | 0 refills | Status: DC

## 2020-09-24 MED ORDER — COLCHICINE 0.6 MG PO TABS: 0.6000 mg | ORAL_TABLET | Freq: Every day | ORAL | 0 refills | Status: DC

## 2020-09-24 MED ORDER — ASPIRIN 81 MG PO TBEC: 81.0000 mg | DELAYED_RELEASE_TABLET | Freq: Every day | ORAL | 12 refills | Status: AC

## 2020-09-24 MED ORDER — SUCRALFATE 1 G PO TABS: 1.0000 g | ORAL_TABLET | Freq: Two times a day (BID) | ORAL | 0 refills | Status: DC

## 2020-09-24 NOTE — Progress Notes (Signed)
Pt given discharge instructions, prescriptions, and care notes. Pt verbalized understanding AEB no further questions or concerns at this time. IV was discontinued, no redness, pain, or swelling noted at this time. Telemetry discontinued and Centralized Telemetry was notified. Pt left the floor via wheelchair with staff in stable condition. 

## 2020-09-24 NOTE — Progress Notes (Signed)
[facility] Progress Note   Subjective: Up in chair, getting dressed to go home. Orders have been sent to OP HD center. No C/Os. Feels much better.     Objective Vitals:   09/23/20 2004 09/23/20 2342 09/24/20 0332 09/24/20 0728  BP: (!) 142/60 (!) 150/69 (!) 131/59 (!) 156/64  Pulse: 91 91 96 88  Resp: 17 15 16 18   Temp: 24.2 F (35.3 C) 98.5 F (36.9 C) 98.6 F (37 C) 98.1 F (36.7 C)  TempSrc: Oral Oral Oral Axillary  SpO2: 99% 100% 100% 100%  Weight:      Height:        Physical Exam General:Pleasant obese female in NAD Heart:RRR No m,r,g  Lungs:Slightly decreased in bases otherwise CTAB.  Abdomen:Obese, NT, active BS. Ostomy LLQ  Extremities:Trace-1+ BLE edema  Dialysis Access:L AVF + bruit.Physical Exam   Additional Objective Labs: Basic Metabolic Panel: Recent Labs  Lab 09/20/20 0104 09/21/20 0437 09/22/20 0113 09/23/20 0120 09/24/20 0052  NA 135 134* 135 133* 131*  K 4.2 3.7 3.5 3.5 4.0  CL 98 97* 100 96* 96*  CO2 26 26 27 24  20*  GLUCOSE 228* 182* 178* 258* 226*  BUN 56* 26* 14 21 28*  CREATININE 5.34* 4.21* 3.58* 4.88* 5.76*  CALCIUM 8.3* 8.5* 8.1* 8.3* 8.0*  PHOS 5.2* 4.3 3.8  --   --    Liver Function Tests: Recent Labs  Lab 09/17/20 2020 09/19/20 0344 09/20/20 0104 09/21/20 0437 09/22/20 0113  AST 46*  --   --   --   --   ALT 36  --   --   --   --   ALKPHOS 326*  --   --   --   --   BILITOT 0.7  --   --   --   --   PROT 5.6*  --   --   --   --   ALBUMIN 2.5*   < > 2.2* 2.5* 2.5*   < > = values in this interval not displayed.   Recent Labs  Lab 09/17/20 2020  LIPASE 60*   CBC: Recent Labs  Lab 09/17/20 2020 09/18/20 1046 09/20/20 1811 09/21/20 0437 09/21/20 1815 09/22/20 0113 09/23/20 0120  WBC 8.1   < > 8.8 7.0 7.3 5.5 6.0  NEUTROABS 4.4  --   --   --   --   --   --   HGB 6.4*   < > 8.0* 7.7* 7.6* 7.2* 8.8*  HCT 20.1*   < > 23.8* 23.8* 23.9* 21.9* 27.2*  MCV 100.0   < > 91.2 93.3 93.7 93.2 92.8   PLT 237   < > 213 204 183 175 193   < > = values in this interval not displayed.   Blood Culture    Component Value Date/Time   SDES BLOOD RIGHT FA 12/28/2018 2315   SPECREQUEST  12/28/2018 2315    BOTTLES DRAWN AEROBIC AND ANAEROBIC Blood Culture adequate volume   CULT  12/28/2018 2315    NO GROWTH 5 DAYS Performed at [facility], [address] [place], [place] [address]    REPTSTATUS 01/02/2019 FINAL 12/28/2018 2315    Cardiac Enzymes: No results for input(s): CKTOTAL, CKMB, CKMBINDEX, TROPONINI in the last 168 hours. CBG: Recent Labs  Lab 09/23/20 2006 09/23/20 2345 09/24/20 0334 09/24/20 0730 09/24/20 0842  GLUCAP 188* 224* 176* 57* 126*   Iron Studies: No results for input(s): IRON, TIBC, TRANSFERRIN, FERRITIN in the last  72 hours. @lablastinr3 @ Studies/Results: No results found. Medications:  . (feeding supplement) PROSource Plus  30 mL Oral BID BM  . Chlorhexidine Gluconate Cloth  6 each Topical Q0600  . colchicine  0.6 mg Oral Daily  . insulin aspart  0-9 Units Subcutaneous Q4H  . insulin detemir  30 Units Subcutaneous BID  . mometasone-formoterol  2 puff Inhalation BID  . pantoprazole  40 mg Oral BID  . sucralfate  1 g Oral TID WC & HS     HD orders: [place] T,Th,S 4 hrs 180NRe 400/500 126.5 kg 2.0K/2.0 Ca L AVF -Heparin 6000 units IV TIW(DC upon Discharge) -Hectorol 4 mcg IV TIW -Sensipar 30 mg PO TIW -Mircera 75 mcg IV q 2 weeks (last HGB on DC 09/14/20 9.4)  Assessment/Plan: 1.UGI bleed-Hgb 6.4 on admit. HGB 8.8 today.   S/P 5 units prbcs. Last 3/ 13/22. Follow Hb trend. Upper endoscopy 3/11 with non-bleeding duodenal ulcers. Protonix, Carafate started per GI. Carafate should be used for limited duration in ESRD patient.  2. HHS/DMT2 - HHS resolved. Insulin per primary  3. ESRD - HD TTS.Next HD 3/15at OP center.Hold heparin.  3. Anemia - As above. On ESA as outpatient, last dose 3/8.  4. Secondary hyperparathyroidism  -Continue sensipar, VDRA. 5. HTN/volume - BP/volume up on admit.Improved with HD. Now at EDW.UF as tolerated.  6. Nutrition -Low Albumin. Renal/Carb mod diet. Protein supps  7. H/O CAD 8. H/O Necrotizing fasciitis R groin with diverting ostomy. S/P multiple debridements. Per primary.  9. L Foot Pain-possible gout. Per primary.   Disposition: DC home today. Has chair at OP clinic 2nd shift for HD.   Lavern Crimi H. Paetyn Pietrzak NP-C 09/24/2020, 10:04 AM  [employer] [phone]

## 2020-09-24 NOTE — Care Management (Addendum)
11: 12 Patient has been ordered home health. Patient discharged by staff without notifying [facility] and prior to [facility] services being set up. Tried to reach patient by phone, LVM requesting call back. Patient is high risk for readmission, will call again today if no callback received.    15:20 Reached out to patient again, no answer.  Spoke w discharging nurse who states that she sent dressing change supplies home patient.

## 2020-09-24 NOTE — Care Management Important Message (Signed)
Important Message  Patient Details  Name: [name] MRN: [id number] Date of Birth: 02-Oct-1955   Medicare Important Message Given:  Yes     [name] [name] 09/24/2020, 11:53 AM

## 2020-09-24 NOTE — Progress Notes (Signed)
Inpatient Diabetes Program Recommendations  AACE/ADA: New Consensus Statement on Inpatient Glycemic Control (2015)  Target Ranges:  Prepandial:   less than 140 mg/dL      Peak postprandial:   less than 180 mg/dL (1-2 hours)      Critically ill patients:  140 - 180 mg/dL   Lab Results  Component Value Date   GLUCAP 126 (H) 09/24/2020   HGBA1C 8.4 (H) 09/13/2020    Review of Glycemic Control Results for KATEY, BARRIE (MRN [id number]) as of 09/24/2020 09:34  Ref. Range 09/23/2020 20:06 09/23/2020 23:45 09/24/2020 03:34 09/24/2020 07:30 09/24/2020 08:42  Glucose-Capillary Latest Ref Range: 70 - 99 mg/dL 188 (H) 224 (H) 176 (H) 57 (L) 126 (H)   Inpatient Diabetes Program Recommendations:   -Decrease Novolog correction to tid with meals + hs 0-5 units  Thank you, Bethena Roys E. Tarique Loveall, RN, MSN, CDE  Diabetes Coordinator Inpatient Glycemic Control Team Team Pager [phone] (8am-5pm) 09/24/2020 9:38 AM

## 2020-09-24 NOTE — Discharge Summary (Addendum)
[name] DEY:[id number] DOB: Mar 18, 1956 DOA: 09/17/2020  PCP: [name], PA-C  Admit date: 09/17/2020  Discharge date: 09/24/2020  Admitted From: Home  Disposition:  Home   Recommendations for Outpatient Follow-up:   Follow up with PCP in 1-2 weeks  PCP Please obtain BMP/CBC, 2 view CXR in 1week,  (see Discharge instructions)   PCP Please follow up on the following pending results: Monitor blood pressure, check CBC, CMP in 7 to 10 days.   Home Health: PT,RN Equipment/Devices: None  Consultations: Renal, GI Discharge Condition: Stable    CODE STATUS: Full    Diet Recommendation: Renal-low carbohydrate, 1.5 L/day fluid restriction     Chief Complaint  Patient presents with  . Abdominal Pain     Brief history of present illness from the day of admission and additional interim summary     Patient is a 65 y.o. female ESRD, history of peroneal necrotizing fasciitis s/p colostomy January 2022, DM-2, anemia, depression, anxiety-referred from hemodialysis center for evaluation of weakness and black tarry stools in her ostomy.  She was found to have acute blood loss anemia and hyperosmolar hyperglycemic state-she was transfused PRBC, started on insulin infusion and subsequently transferred to [facility].    Significant events: 3/8>> admit for black stools in ostomy with acute blood loss anemia and hyperosmolar hyperglycemic state requiring IV insulin.  Significant studies: 3/8>> chest x-[place]: No acute abnormality 3/11>> EGD: Multiple nonbleeding ulcers in duodenum  Antimicrobial therapy: None  Microbiology data: None  Procedures : 3/11>> EGD: Multiple nonbleeding ulcers in duodenum  Consults: GI, ESRD                                                                 Hospital  Course    Probable upper GI bleeding with acute blood loss anemia: No further black stool in ostomy-brown stools seen this morning-hemoglobin has stabilized after total of 5 units of PRBC transfusion last transfusion on 09/22/2020 by renal.  GI on board underwent EGD showing nonbleeding duodenal ulcers, continue PPI and monitoring H&H.  If stable likely discharge on 09/24/2020, PPI and 4 weeks total of Carafate upon discharge with outpatient follow-up with Dr. Benson Norway in 1 to 2 weeks.  ESRD: On HD-nephrology following and directing care, TTS.  CAD: Without any anginal symptoms-aspirin on hold will resume in a week from now post discharge.  Home dose beta-blocker continued.  HTN: BP remains stable-resume home dose Coreg, does not require hydralazine for now.  PCP to monitor closely.  COPD: Not in exacerbation-continue bronchodilators  History of necrotizing fasciitis involving perineum-s/p multiple debridements-subsequent placement of a colostomy in January 2022, has a chronic right groin wound for which wound care consulted.  Will get home wound care nursing as well.  Wound appears clean  Left foot pain  for 2 to 3 weeks.    This was consistent with gout much improved after single short steroid and colchicine, continue colchicine 2 weeks thereafter home dose allopurinol  Prolonged QTC:  Able on telemetry.  Hyperosmolar hyperglycemic state: Treated with IV insulin-has been transitioned to SQ insulin.  Insulin-dependent DM-2 (A1c 8.4 on 3/4):  For outpatient glycemic control, likely for diet-controlled related, much improved here on diet controlled and requiring less than home dose insulin, for now continue home regimen with strict QA CHS CBG checks, requested to show readings in a week to PCP for further adjustment   Discharge diagnosis     Principal Problem:   Upper GI bleed Active Problems:   Uncontrolled type 2 diabetes mellitus with hyperosmolar nonketotic hyperglycemia (HCC)    Essential hypertension   Hyperkalemia   CAD (coronary artery disease)   ESRD (end stage renal disease) on dialysis (HCC)   Prolonged QT interval   COPD (chronic obstructive pulmonary disease) ([facility])    Discharge instructions    Discharge Instructions    Discharge instructions   Complete by: As directed    Follow with Primary MD [name], PA-C in 7 days   Get CBC, CMP, 2 view Chest X [place] -  checked next visit within 1 week by Primary MD    Activity: As tolerated with Full fall precautions use walker/cane & assistance as needed  Disposition Home    Diet: Renal with 1.5 L fluid restriction per day.   Accuchecks 4 times/day, Once in AM empty stomach and then before each meal. Log in all results and show them to your Prim.MD in 3 days. If any glucose reading is under 80 or above 300 call your Prim MD immidiately. Follow Low glucose instructions for glucose under 80 as instructed.   Special Instructions: If you have smoked or chewed Tobacco  in the last 2 yrs please stop smoking, stop any regular Alcohol  and or any Recreational drug use.  On your next visit with your primary care physician please Get Medicines reviewed and adjusted.  Please request your Prim.MD to go over all Hospital Tests and Procedure/Radiological results at the follow up, please get all Hospital records sent to your Prim MD by signing hospital release before you go home.  If you experience worsening of your admission symptoms, develop shortness of breath, life threatening emergency, suicidal or homicidal thoughts you must seek medical attention immediately by calling 911 or calling your MD immediately  if symptoms less severe.  You Must read complete instructions/literature along with all the possible adverse reactions/side effects for all the Medicines you take and that have been prescribed to you. Take any new Medicines after you have completely understood and accpet all the possible adverse  reactions/side effects.   Discharge wound care:   Complete by: As directed    Daily - Cleanse right inguinal fold wound with soap and water. Pat dry. Fold a strip of Xeroform gauze, Lawson #294, and place over wound. Change daily.   Increase activity slowly   Complete by: As directed       Discharge Medications   Allergies as of 09/24/2020      Reactions   Morphine Itching   Tolerated hydromorphone on 07/2020   Shellfish Allergy Anaphylaxis, Swelling   Diazepam Other (See Comments)   "felt like out of body experience"      Medication List    STOP taking these medications   gentamicin cream 0.1 % Commonly known as: GARAMYCIN  hydrALAZINE 100 MG tablet Commonly known as: APRESOLINE   nystatin 100000 UNIT/ML suspension Commonly known as: MYCOSTATIN   omeprazole 20 MG capsule Commonly known as: PRILOSEC     TAKE these medications   albuterol 108 (90 Base) MCG/ACT inhaler Commonly known as: VENTOLIN HFA Inhale 2 puffs into the lungs every 4 (four) hours as needed for wheezing or shortness of breath.   allopurinol 100 MG tablet Commonly known as: ZYLOPRIM Take 1 tablet (100 mg total) by mouth daily. Start taking on: October 08, 2020 What changed: These instructions start on October 08, 2020. If you are unsure what to do until then, ask your doctor or other care provider.   aspirin 81 MG EC tablet Take 1 tablet (81 mg total) by mouth daily.   atorvastatin 80 MG tablet Commonly known as: LIPITOR TAKE 1 TABLET BY MOUTH ONCE DAILY AT 6PM What changed:   how much to take  how to take this  when to take this  additional instructions   Auryxia 1 GM 210 MG(Fe) tablet Generic drug: ferric citrate Take 2 tablets (420 mg total) by mouth 3 (three) times daily with meals.   buPROPion 150 MG 12 hr tablet Commonly known as: Wellbutrin SR Take 1 tablet (150 mg total) by mouth 2 (two) times daily.   carvedilol 25 MG tablet Commonly known as: COREG TAKE (1) TABLET BY MOUTH  TWICE A DAY WITH MEALS (BREAKFAST AND SUPPER) What changed: See the new instructions.   colchicine 0.6 MG tablet Take 1 tablet (0.6 mg total) by mouth once a week for 15 days. Start taking on: September 29, 2020   cyclobenzaprine 5 MG tablet Commonly known as: FLEXERIL Take 1 tablet (5 mg total) by mouth 3 (three) times daily as needed for muscle spasms.   dicyclomine 20 MG tablet Commonly known as: BENTYL TAKE 1 TABLET(20 MG) BY MOUTH FOUR TIMES DAILY BEFORE MEALS AND AT BEDTIME What changed: See the new instructions.   docusate sodium 100 MG capsule Commonly known as: COLACE Take 1 capsule (100 mg total) by mouth 2 (two) times daily as needed for mild constipation.   doxepin 10 MG capsule Commonly known as: SINEQUAN TAKE 1 CAPSULE(10 MG) BY MOUTH FOUR TIMES DAILY AS NEEDED FOR ITCHING What changed:   how much to take  how to take this  when to take this  additional instructions   Dulera 200-5 MCG/ACT Aero Generic drug: mometasone-formoterol Inhale 2 puffs into the lungs 2 (two) times daily as needed for wheezing or shortness of breath.   feeding supplement (NEPRO CARB STEADY) Liqd Take 237 mLs by mouth daily.   fluticasone 50 MCG/ACT nasal spray Commonly known as: FLONASE Place 2 sprays into both nostrils daily as needed for allergies or rhinitis.   fluticasone furoate-vilanterol 200-25 MCG/INH Aepb Commonly known as: BREO ELLIPTA Inhale 1 puff into the lungs daily.   FreeStyle Libre 14 Day Reader Kerrin Mo To check blood sugar ACHS   FreeStyle Libre 14 Day Sensor Misc To check blood sugar ACHS; change every 14 days   hydrOXYzine 25 MG tablet Commonly known as: ATARAX/VISTARIL Take 1 tablet (25 mg total) by mouth 4 (four) times daily. What changed: when to take this   insulin glargine 100 unit/mL Sopn Commonly known as: LANTUS Inject 25 Units into the skin 2 (two) times daily.   insulin lispro 100 UNIT/ML KwikPen Commonly known as: HumaLOG KwikPen Inject 25  Units into the skin with breakfast, with lunch, and with evening meal. What changed:  how much to take  when to take this   Levemir FlexTouch 100 UNIT/ML FlexPen Generic drug: insulin detemir Inject 60 Units into the skin 2 (two) times daily.   lidocaine-prilocaine cream Commonly known as: EMLA Apply 1 application topically as needed. What changed: reasons to take this   loperamide 2 MG tablet Commonly known as: IMODIUM A-D Take 1 tablet (2 mg total) by mouth as needed (diarrhea).   loratadine 10 MG tablet Commonly known as: CLARITIN TAKE 1 TABLET BY MOUTH ONCE DAILY. What changed:   when to take this  reasons to take this   mometasone 0.1 % cream Commonly known as: ELOCON APPLY AS DIRECTED TO AFFECTED AREA ONCE DAILY. What changed: See the new instructions.   montelukast 10 MG tablet Commonly known as: SINGULAIR TAKE ONE TABLET BY MOUTH AT BEDTIME. What changed:   when to take this  reasons to take this   nitroGLYCERIN 0.4 MG SL tablet Commonly known as: NITROSTAT Place 1 tablet (0.4 mg total) under the tongue every 5 (five) minutes x 3 doses as needed for chest pain.   nystatin cream Commonly known as: MYCOSTATIN Apply 1 application topically 2 (two) times daily. What changed:   when to take this  reasons to take this   oxyCODONE 5 MG immediate release tablet Commonly known as: Roxicodone Take 1 tablet (5 mg total) by mouth every 12 (twelve) hours as needed for severe pain.   pantoprazole 40 MG tablet Commonly known as: PROTONIX Take 1 tablet (40 mg total) by mouth 2 (two) times daily.   pregabalin 150 MG capsule Commonly known as: LYRICA TAKE 1 CAPSULE(150 MG) BY MOUTH TWICE DAILY What changed: See the new instructions.   RENA-VITE RX PO Take 1 mg by mouth daily.   Senna-Plus 8.6-50 MG tablet Generic drug: senna-docusate TAKE ONE TABLET BY MOUTH AT BEDTIME.   SENSIPAR PO Take 30 mg by mouth every dialysis.   sevelamer carbonate 800 MG  tablet Commonly known as: RENVELA Take 800 mg by mouth 3 (three) times daily.   silver sulfADIAZINE 1 % cream Commonly known as: SILVADENE Apply 1 application topically daily. Apply to right first toe What changed: See the new instructions.   sucralfate 1 g tablet Commonly known as: CARAFATE Take 1 tablet (1 g total) by mouth 2 (two) times daily for 26 days.   torsemide 20 MG tablet Commonly known as: DEMADEX TAKE (2) TABLETS BY MOUTH TWICE DAILY. What changed: See the new instructions.   triamcinolone 0.1 % Commonly known as: KENALOG APPLY TO AFFECTED AREA TWICE DAILY What changed: See the new instructions.   valACYclovir 500 MG tablet Commonly known as: VALTREX TAKE (1) TABLET BY MOUTH EVERY OTHER DAY. What changed: See the new instructions.   Victoza 18 MG/3ML Sopn Generic drug: liraglutide Start with 0.6mg  daily and increase by 0.6mg  per week until max dose of 1.8 mg dose achieved. What changed: See the new instructions.   Vitamin D (Ergocalciferol) 1.25 MG (50000 UNIT) Caps capsule Commonly known as: DRISDOL TAKE 1 CAPSULE BY MOUTH ONCE A MONTH What changed:   how much to take  how to take this  when to take this  additional instructions            Discharge Care Instructions  (From admission, onward)         Start     Ordered   09/24/20 0000  Discharge wound care:       Comments: Daily - Cleanse right inguinal  fold wound with soap and water. Pat dry. Fold a strip of Xeroform gauze, Lawson #294, and place over wound. Change daily.   09/24/20 0945           Follow-up Information    [name], PA-C. Schedule an appointment as soon as possible for a visit in 1 week(s).   Specialty: Family Medicine Contact information: [place] [address] [phone]        Sherren Mocha, MD. Schedule an appointment as soon as possible for a visit in 1 week(s).   Specialty: Cardiology Contact information: [address] [place] [address] [phone]        Carol Ada, MD. Schedule an appointment as soon as possible for a visit in 1 week(s).   Specialty: Gastroenterology Contact information: [address] [place] [address] [phone]               Major procedures and Radiology Reports - PLEASE review detailed and final reports thoroughly  -       DG Chest Port 1 View  Result Date: 09/17/2020 CLINICAL DATA:  Weakness EXAM: PORTABLE CHEST 1 VIEW COMPARISON:  Radiograph 09/13/2020 FINDINGS: Stable cardiomediastinal contours. Some minimal vascular congestion without features of frank edema. No consolidation, pneumothorax, or effusion. Telemetry leads overlie the chest. No acute osseous or soft tissue abnormality. IMPRESSION: Minimal vascular congestion without features of frank edema. No other acute cardiopulmonary abnormality. Electronically Signed   By: Lovena Le M.D.   On: 09/17/2020 22:07   DG Chest Portable 1 View  Result Date: 09/13/2020 CLINICAL DATA:  Shortness of breath EXAM: PORTABLE CHEST 1 VIEW COMPARISON:  01/30/2020 FINDINGS: Cardiac shadow is prominent but stable. Mild vascular congestion is seen increased from the prior exam. No pulmonary edema is noted. Mild right basilar atelectasis is seen. IMPRESSION: Mild vascular congestion and right basilar atelectasis. No focal confluent infiltrate is noted. Electronically Signed   By: Inez Catalina M.D.   On: 09/13/2020 05:22   DG Foot Complete Left  Result Date: 09/22/2020 CLINICAL DATA:  Chronic left foot pain, worsening, no known injury EXAM: LEFT FOOT - COMPLETE 3+ VIEW COMPARISON:  None. FINDINGS: There is no evidence of fracture or dislocation. There is no evidence of arthropathy or other focal bone abnormality. Vascular calcinosis. Diffuse soft tissue edema about the foot. IMPRESSION: 1. No fracture or dislocation of the left foot. Joint spaces are well preserved. 2. Diffuse soft  tissue edema about the foot. Electronically Signed   By: Eddie Candle M.D.   On: 09/22/2020 09:50    Micro Results     Recent Results (from the past 240 hour(s))  Resp Panel by RT-PCR (Flu A&B, Covid) Nasopharyngeal Swab     Status: None   Collection Time: 09/17/20  8:44 PM   Specimen: Nasopharyngeal Swab; Nasopharyngeal(NP) swabs in vial transport medium  Result Value Ref Range Status   SARS Coronavirus 2 by RT PCR NEGATIVE NEGATIVE Final    Comment: (NOTE) SARS-CoV-2 target nucleic acids are NOT DETECTED.  The SARS-CoV-2 RNA is generally detectable in upper respiratory specimens during the acute phase of infection. The lowest concentration of SARS-CoV-2 viral copies this assay can detect is 138 copies/mL. A negative result does not preclude SARS-Cov-2 infection and should not be used as the sole basis for treatment or other patient management decisions. A negative result may occur with  improper specimen collection/handling, submission of specimen other than nasopharyngeal swab, presence of viral  mutation(s) within the areas targeted by this assay, and inadequate number of viral copies(<138 copies/mL). A negative result must be combined with clinical observations, patient history, and epidemiological information. The expected result is Negative.  Fact Sheet for Patients:  EntrepreneurPulse.com.au  Fact Sheet for Healthcare Providers:  IncredibleEmployment.be  This test is no t yet approved or cleared by the [place] FDA and  has been authorized for detection and/or diagnosis of SARS-CoV-2 by FDA under an Emergency Use Authorization (EUA). This EUA will remain  in effect (meaning this test can be used) for the duration of the COVID-19 declaration under Section 564(b)(1) of the Act, 21 U.S.C.section 360bbb-3(b)(1), unless the authorization is terminated  or revoked sooner.       Influenza A by PCR NEGATIVE NEGATIVE Final   Influenza  B by PCR NEGATIVE NEGATIVE Final    Comment: (NOTE) The Xpert Xpress SARS-CoV-2/FLU/RSV plus assay is intended as an aid in the diagnosis of influenza from Nasopharyngeal swab specimens and should not be used as a sole basis for treatment. Nasal washings and aspirates are unacceptable for Xpert Xpress SARS-CoV-2/FLU/RSV testing.  Fact Sheet for Patients: EntrepreneurPulse.com.au  Fact Sheet for Healthcare Providers: IncredibleEmployment.be  This test is not yet approved or cleared by the [place] FDA and has been authorized for detection and/or diagnosis of SARS-CoV-2 by FDA under an Emergency Use Authorization (EUA). This EUA will remain in effect (meaning this test can be used) for the duration of the COVID-19 declaration under Section 564(b)(1) of the Act, 21 U.S.C. section 360bbb-3(b)(1), unless the authorization is terminated or revoked.  Performed at [facility], [place] [address], [place], [place] [address]   MRSA PCR Screening     Status: Abnormal   Collection Time: 09/19/20 12:24 AM   Specimen: Nasal Mucosa; Nasopharyngeal  Result Value Ref Range Status   MRSA by PCR POSITIVE (A) NEGATIVE Final    Comment:        The GeneXpert MRSA Assay (FDA approved for NASAL specimens only), is one component of a comprehensive MRSA colonization surveillance program. It is not intended to diagnose MRSA infection nor to guide or monitor treatment for MRSA infections. RESULT CALLED TO, READ BACK BY AND VERIFIED WITHFrankey Poot RN 09/19/20 4782 JDW Performed at [facility], [place] [address] [place], [place] [address]     Today   Subjective    Shanaye Rief today has no headache,no chest abdominal pain,no new weakness tingling or numbness, feels much better wants to go home today.    Objective   Blood pressure (!) 156/64, pulse 88, temperature 98.1 F (36.7 C), temperature source Axillary, resp. rate 18, height 5\' 8"  (1.727  m), weight 126 kg, SpO2 100 %.   Intake/Output Summary (Last 24 hours) at 09/24/2020 1026 Last data filed at 09/24/2020 0300 Gross per 24 hour  Intake 480 ml  Output 725 ml  Net -245 ml    Exam  Awake Alert, No new F.N deficits, Normal affect [place].AT,PERRAL Supple Neck,No JVD, No cervical lymphadenopathy appriciated.  Symmetrical Chest wall movement, Good air movement bilaterally, CTAB RRR,No Gallops,Rubs or new Murmurs, No Parasternal Heave +ve B.Sounds, Abd Soft, Non tender, No organomegaly appriciated, No rebound -guarding or rigidity. No Cyanosis, right inguinal wound looks clean, left foot no warmth or redness.   Data Review   CBC w Diff:  Lab Results  Component Value Date   WBC 6.0 09/23/2020   HGB 8.8 (L) 09/23/2020   HGB 8.9 (L) 01/10/2018  HCT 27.2 (L) 09/23/2020   HCT 28.2 (L) 01/10/2018   PLT 193 09/23/2020   PLT 285 01/10/2018   LYMPHOPCT 34 09/17/2020   MONOPCT 8 09/17/2020   EOSPCT 2 09/17/2020   BASOPCT 0 09/17/2020    CMP:  Lab Results  Component Value Date   NA 131 (L) 09/24/2020   NA 138 11/06/2019   K 4.0 09/24/2020   CL 96 (L) 09/24/2020   CO2 20 (L) 09/24/2020   BUN 28 (H) 09/24/2020   BUN 54 (H) 11/06/2019   CREATININE 5.76 (H) 09/24/2020   CREATININE 1.36 (H) 09/23/2016   PROT 5.6 (L) 09/17/2020   PROT 7.1 09/01/2017   ALBUMIN 2.5 (L) 09/22/2020   ALBUMIN 4.1 11/06/2019   BILITOT 0.7 09/17/2020   BILITOT <0.2 09/01/2017   ALKPHOS 326 (H) 09/17/2020   AST 46 (H) 09/17/2020   ALT 36 09/17/2020  .   Total Time in preparing paper work, data evaluation and todays exam - 25 minutes  Lala Lund M.D on 09/24/2020 at 10:26 AM  [facility]

## 2020-09-24 NOTE — Discharge Instructions (Signed)
Follow with Primary MD [name], PA-C in 7 days   Get CBC, CMP, 2 view Chest X [place] -  checked next visit within 1 week by Primary MD    Activity: As tolerated with Full fall precautions use walker/cane & assistance as needed  Disposition Home    Diet: Renal with 1.5 L fluid restriction per day.   Accuchecks 4 times/day, Once in AM empty stomach and then before each meal. Log in all results and show them to your Prim.MD in 3 days. If any glucose reading is under 80 or above 300 call your Prim MD immidiately. Follow Low glucose instructions for glucose under 80 as instructed.   Special Instructions: If you have smoked or chewed Tobacco  in the last 2 yrs please stop smoking, stop any regular Alcohol  and or any Recreational drug use.  On your next visit with your primary care physician please Get Medicines reviewed and adjusted.  Please request your Prim.MD to go over all Hospital Tests and Procedure/Radiological results at the follow up, please get all Hospital records sent to your Prim MD by signing hospital release before you go home.  If you experience worsening of your admission symptoms, develop shortness of breath, life threatening emergency, suicidal or homicidal thoughts you must seek medical attention immediately by calling 911 or calling your MD immediately  if symptoms less severe.  You Must read complete instructions/literature along with all the possible adverse reactions/side effects for all the Medicines you take and that have been prescribed to you. Take any new Medicines after you have completely understood and accpet all the possible adverse reactions/side effects.

## 2020-09-25 ENCOUNTER — Telehealth: Payer: Self-pay

## 2020-09-25 ENCOUNTER — Telehealth: Payer: Self-pay | Admitting: Nurse Practitioner

## 2020-09-25 NOTE — Telephone Encounter (Signed)
No HFU scheduled. Next apt is a 3 month f/u scheduled for 09/27/20 @ 1:00 PM.

## 2020-09-25 NOTE — Telephone Encounter (Signed)
I have made the 1st attempt to contact the patient or family member in charge, in order to follow up from recently being discharged from the hospital. I left a message on voicemail requesting a CB. -MM 

## 2020-09-25 NOTE — Telephone Encounter (Signed)
Transition of care contact from inpatient facility  Date of Discharge: 09/24/2020 Date of Contact: 09/25/2020 Method of contact: Phone  Attempted to contact patient to discuss transition of care from inpatient admission. Patient did not answer the phone. Message was left on the patient's voicemail with call back number [phone].  

## 2020-09-26 ENCOUNTER — Telehealth: Payer: Self-pay | Admitting: Nurse Practitioner

## 2020-09-26 ENCOUNTER — Telehealth: Payer: Self-pay | Admitting: Physician Assistant

## 2020-09-26 NOTE — Telephone Encounter (Signed)
Sharyn Lull calling from [facility] is calling for Verbal Orders for Skilled Nursing. Frequency- 1 week 9,  Cb- [phone] Ok to leave verbal on VM.

## 2020-09-26 NOTE — Telephone Encounter (Signed)
I have made the 2nd attempt to contact the patient or family member in charge, in order to follow up from recently being discharged from the hospital. I left a message on voicemail requesting a CB. -MM  

## 2020-09-26 NOTE — Telephone Encounter (Cosign Needed)
Transition of care contact from inpatient facility  Date of discharge: 09/24/20 Date of contact: 09/26/20 Method: Phone Spoke to: Patient  Patient contacted to discuss transition of care from recent inpatient hospitalization. Patient was admitted to [facility] from 03/08-03/15/2022 with discharge diagnosis of acute blood loss anemia/GIB.   Medication changes were reviewed.  Patient will follow up with his/her outpatient HD unit on: Was supposed to do to HD following discharge 09/24/2020 but didn't have her house keys and was locked out of her house. She attended HD 03/16//2022 and again today to get back on schedule.

## 2020-09-27 ENCOUNTER — Encounter: Payer: Self-pay | Admitting: Physician Assistant

## 2020-09-27 ENCOUNTER — Ambulatory Visit (INDEPENDENT_AMBULATORY_CARE_PROVIDER_SITE_OTHER): Payer: Medicare Other | Admitting: Physician Assistant

## 2020-09-27 ENCOUNTER — Other Ambulatory Visit: Payer: Self-pay

## 2020-09-27 DIAGNOSIS — E1100 Type 2 diabetes mellitus with hyperosmolarity without nonketotic hyperglycemic-hyperosmolar coma (NKHHC): Secondary | ICD-10-CM | POA: Diagnosis not present

## 2020-09-27 DIAGNOSIS — E1140 Type 2 diabetes mellitus with diabetic neuropathy, unspecified: Secondary | ICD-10-CM

## 2020-09-27 DIAGNOSIS — E1142 Type 2 diabetes mellitus with diabetic polyneuropathy: Secondary | ICD-10-CM

## 2020-09-27 DIAGNOSIS — E1122 Type 2 diabetes mellitus with diabetic chronic kidney disease: Secondary | ICD-10-CM

## 2020-09-27 DIAGNOSIS — N186 End stage renal disease: Secondary | ICD-10-CM

## 2020-09-27 DIAGNOSIS — R197 Diarrhea, unspecified: Secondary | ICD-10-CM

## 2020-09-27 DIAGNOSIS — Z794 Long term (current) use of insulin: Secondary | ICD-10-CM

## 2020-09-27 DIAGNOSIS — E559 Vitamin D deficiency, unspecified: Secondary | ICD-10-CM

## 2020-09-27 MED ORDER — DICYCLOMINE HCL 20 MG PO TABS: 20.0000 mg | ORAL_TABLET | Freq: Two times a day (BID) | ORAL | 1 refills | Status: DC

## 2020-09-27 MED ORDER — FREESTYLE LIBRE 14 DAY SENSOR MISC
11 refills | Status: DC
Start: 2020-09-27 — End: 2024-05-08

## 2020-09-27 MED ORDER — PREGABALIN 150 MG PO CAPS: 150.0000 mg | ORAL_CAPSULE | Freq: Two times a day (BID) | ORAL | 1 refills | Status: DC

## 2020-09-27 MED ORDER — VITAMIN D (ERGOCALCIFEROL) 1.25 MG (50000 UNIT) PO CAPS: ORAL_CAPSULE | ORAL | 1 refills | Status: DC

## 2020-09-27 MED ORDER — FREESTYLE LIBRE 14 DAY READER DEVI: 0 refills | Status: DC

## 2020-09-27 NOTE — Progress Notes (Signed)
Established patient visit   Patient: [name]   DOB: Jun 06, 1956   65 y.o. Female  MRN: [id number] Visit Date: 09/27/2020  Today's healthcare provider: [name], PA-C   Chief Complaint  Patient presents with  . Hospitalization Follow-up   Subjective    HPI  Follow up Hospitalization  Patient was admitted to [facility] on 03/09/and discharged on 09/24/20 . She was treated for Hyperosmolar hyperglycemia, Upper GI bleed and hyperkalemia. Treatment for this included IV treatment. She does have a colostomy in place that was put in 07/28/2020. Telephone follow up was done on 09/25/20   She reports good compliance with treatment. She reports this condition is improving. She reports she is feeling so much better. Slowly improving.   ----------------------------------------------------------------------------------------- -   Patient Active Problem List   Diagnosis Date Noted  . Upper GI bleed 09/17/2020  . Prolonged QT interval 09/17/2020  . COPD (chronic obstructive pulmonary disease) ([place]) 09/17/2020  . Acute pulmonary edema ([place]) 09/14/2020  . Acute respiratory failure with hypoxia ([place]) 09/13/2020  . Allergy, unspecified, initial encounter 03/29/2020  . Anaphylactic shock, unspecified, initial encounter 03/29/2020  . Volume overload 01/30/2020  . Pain in left upper arm 01/18/2020  . Cellulitis 01/16/2020  . Anemia due to end stage renal disease ([place]) 01/16/2020  . Diabetic polyneuropathy associated with type 2 diabetes mellitus ([place]) 11/06/2019  . Nystagmus 11/06/2019  . Intermittent claudication ([place]) 11/06/2019  . Vertigo of central origin 11/06/2019  . Weakness   . Hypervolemia associated with renal insufficiency 10/31/2019  . BMI 45.0-49.9, adult ([place]) 10/18/2019  . Ascending aortic aneurysm ([place]) 03/27/2019  . Puncture wound without foreign body of left forearm, initial encounter 01/02/2019  . Non-healing wound of upper extremity 12/28/2018  .  Unspecified open wound of left upper arm, initial encounter 12/06/2018  . Complication from renal dialysis device 10/20/2018  . Iron deficiency anemia, unspecified 10/18/2018  . ESRD (end stage renal disease) on dialysis ([place]) 06/01/2018  . ARF (acute renal failure) ([place]) 05/09/2018  . Colon cancer screening   . LGSIL on Pap smear of cervix 01/27/2018  . Gout 12/25/2017  . AKI (acute kidney injury) ([place]) 12/24/2017  . Hyperglycemia 12/24/2017  . Chronic diastolic heart failure ([place]) 09/30/2017  . Lymphedema 09/30/2017  . Aortic stenosis   . Acute pain of both knees 02/12/2017  . Chronic gout due to renal impairment of multiple sites without tophus 02/12/2017  . Esophageal dysphagia 02/12/2017  . Osteoarthritis 01/22/2017  . Diabetic hyperosmolar non-ketotic state ([place]) 12/13/2016  . CAD (coronary artery disease) 12/13/2016  . Hyperlipidemia 12/13/2016  . Hyperphosphatemia 12/13/2016  . Acute diastolic CHF (congestive heart failure) ([place])   . Hyperkalemia 08/21/2016  . Asthma 11/29/2015  . Depression 09/05/2015  . GERD (gastroesophageal reflux disease) 03/25/2015  . Environmental allergies 03/14/2015  . SOB (shortness of breath) 02/15/2015  . Chronic hepatitis C without hepatic coma ([place]) 01/31/2015  . Diabetic neuropathy ([place]) 01/31/2015  . OSA (obstructive sleep apnea) 01/01/2015  . Poor dentition 11/13/2014  . Essential hypertension 10/08/2014  . Morbid (severe) obesity due to excess calories ([place]) 10/08/2014  . Localized, primary osteoarthritis 01/25/2014  . Family history of diabetes mellitus 03/31/2013  . Pain in limb 03/31/2013  . Uncontrolled type 2 diabetes mellitus with hyperosmolar nonketotic hyperglycemia ([place]) 03/25/2012    Class: Chronic   Past Medical History:  Diagnosis Date  . Anemia   . Aortic stenosis    Echo 8/18: mean 13, peak 28, LVOT/AV  mean velocity 0.51  . Arthritis   . Asthma    As a child   . Bronchitis   . CAD (coronary artery disease)     a. 09/2016: 50% Ost 1st Mrg stenosis, 50% 2nd Mrg stenosis, 20% Mid-Cx, 95% Prox LAD, 40% mid-LAD, and 10% dist-LAD stenosis. Staged PCI with DES to Prox-LAD.   [name] Chronic combined systolic and diastolic CHF (congestive heart failure) ([place]) 2011   echo 2/18: EF 55-60, normal wall motion, grade 2 diastolic dysfunction, trivial AI // echo 3/18: Septal and apical HK, EF 45-50, normal wall motion, trivial AI, mild LAE, PASP 38 // echo 8/18: EF 60-65, normal wall motion, grade 1 diastolic dysfunction, calcified aortic valve leaflets, mild aortic stenosis (mean 13, peak 28, LVOT/AV mean velocity 0.51), mild AI, moderate MAC, mild LAE, trivial TR   . Chronic kidney disease    STAGE 4  . Chronic kidney disease on chronic dialysis (HCC)    t, th, sat  . Complication of anesthesia   . Depression   . Diabetes mellitus Dx 1989  . Elevated lipids   . GERD (gastroesophageal reflux disease)   . Gout   . Heart murmur    asymptomatic  . Hepatitis C Dx 2013  . Hypertension Dx 1989  . Infected surgical wound    Lt arm  . Myocardial infarction ([place]) 07/2015  . Obesity   . Pancreatitis 2013  . Pneumonia   . Refusal of blood transfusions as patient is Jehovah's Witness    pt states she is not [place] witness and does not refuse blood products  . Tendinitis   . Tremors of nervous system    LEFT HAND  . Ulcer 2010   Allergies  Allergen Reactions  . Morphine Itching    Tolerated hydromorphone on 07/2020  . Shellfish Allergy Anaphylaxis and Swelling  . Diazepam Other (See Comments)    "felt like out of body experience"       Medications: Outpatient Medications Prior to Visit  Medication Sig  . albuterol (VENTOLIN HFA) 108 (90 Base) MCG/ACT inhaler Inhale 2 puffs into the lungs every 4 (four) hours as needed for wheezing or shortness of breath.  . allopurinol (ZYLOPRIM) 100 MG tablet Take 1 tablet (100 mg total) by mouth daily.  [name] aspirin 81 MG EC tablet Take 1 tablet (81 mg total) by mouth daily.   [name] atorvastatin (LIPITOR) 80 MG tablet TAKE 1 TABLET BY MOUTH ONCE DAILY AT 6PM (Patient taking differently: Take 80 mg by mouth every evening.)  . AURYXIA 1 GM 210 MG(Fe) tablet Take 2 tablets (420 mg total) by mouth 3 (three) times daily with meals.  . B Complex-C-Folic Acid (RENA-VITE RX PO) Take 1 mg by mouth daily.  [name] buPROPion (WELLBUTRIN SR) 150 MG 12 hr tablet Take 1 tablet (150 mg total) by mouth 2 (two) times daily.  . carvedilol (COREG) 25 MG tablet TAKE (1) TABLET BY MOUTH TWICE A DAY WITH MEALS (BREAKFAST AND SUPPER) (Patient taking differently: Take 25 mg by mouth 2 (two) times daily with a meal.)  . Cinacalcet HCl (SENSIPAR PO) Take 30 mg by mouth every dialysis.  [name] colchicine 0.6 MG tablet Take 1 tablet (0.6 mg total) by mouth once a week for 15 days.  . cyclobenzaprine (FLEXERIL) 5 MG tablet Take 1 tablet (5 mg total) by mouth 3 (three) times daily as needed for muscle spasms.  [name] docusate sodium (COLACE) 100 MG capsule Take 1 capsule (100 mg total) by mouth 2 (  two) times daily as needed for mild constipation.  [name] doxepin (SINEQUAN) 10 MG capsule TAKE 1 CAPSULE(10 MG) BY MOUTH FOUR TIMES DAILY AS NEEDED FOR ITCHING (Patient taking differently: Take 10 mg by mouth 2 (two) times daily.)  . fluticasone (FLONASE) 50 MCG/ACT nasal spray Place 2 sprays into both nostrils daily as needed for allergies or rhinitis.  . fluticasone furoate-vilanterol (BREO ELLIPTA) 200-25 MCG/INH AEPB Inhale 1 puff into the lungs daily.  . hydrOXYzine (ATARAX/VISTARIL) 25 MG tablet Take 1 tablet (25 mg total) by mouth 4 (four) times daily. (Patient taking differently: Take 25 mg by mouth in the morning and at bedtime.)  . insulin detemir (LEVEMIR FLEXTOUCH) 100 UNIT/ML FlexPen Inject 60 Units into the skin 2 (two) times daily.  . insulin glargine (LANTUS) 100 unit/mL SOPN Inject 25 Units into the skin 2 (two) times daily.  . insulin lispro (HUMALOG KWIKPEN) 100 UNIT/ML KwikPen Inject 25 Units into the skin with  breakfast, with lunch, and with evening meal. (Patient taking differently: Inject 7 Units into the skin 2 (two) times daily.)  . lidocaine-prilocaine (EMLA) cream Apply 1 application topically as needed. (Patient taking differently: Apply 1 application topically as needed (dialysis days).)  . loperamide (IMODIUM A-D) 2 MG tablet Take 1 tablet (2 mg total) by mouth as needed (diarrhea).  . loratadine (CLARITIN) 10 MG tablet TAKE 1 TABLET BY MOUTH ONCE DAILY. (Patient taking differently: Take 10 mg by mouth daily as needed for allergies.)  . montelukast (SINGULAIR) 10 MG tablet TAKE ONE TABLET BY MOUTH AT BEDTIME. (Patient taking differently: Take 10 mg by mouth at bedtime as needed (allergies).)  . nitroGLYCERIN (NITROSTAT) 0.4 MG SL tablet Place 1 tablet (0.4 mg total) under the tongue every 5 (five) minutes x 3 doses as needed for chest pain.  . Nutritional Supplements (FEEDING SUPPLEMENT, NEPRO CARB STEADY,) LIQD Take 237 mLs by mouth daily.  [name] nystatin cream (MYCOSTATIN) Apply 1 application topically 2 (two) times daily. (Patient taking differently: Apply 1 application topically daily as needed for dry skin.)  . oxyCODONE (ROXICODONE) 5 MG immediate release tablet Take 1 tablet (5 mg total) by mouth every 12 (twelve) hours as needed for severe pain.  . pantoprazole (PROTONIX) 40 MG tablet Take 1 tablet (40 mg total) by mouth 2 (two) times daily.  . SENNA-PLUS 8.6-50 MG tablet TAKE ONE TABLET BY MOUTH AT BEDTIME.  . sevelamer carbonate (RENVELA) 800 MG tablet Take 800 mg by mouth 3 (three) times daily.  . sucralfate (CARAFATE) 1 g tablet Take 1 tablet (1 g total) by mouth 2 (two) times daily for 26 days.  [name] triamcinolone cream (KENALOG) 0.1 % APPLY TO AFFECTED AREA TWICE DAILY (Patient taking differently: Apply 1 application topically 2 (two) times daily as needed (rash).)  . valACYclovir (VALTREX) 500 MG tablet TAKE (1) TABLET BY MOUTH EVERY OTHER DAY. (Patient taking differently: Take 500 mg by  mouth every other day.)  . VICTOZA 18 MG/3ML SOPN Start with 0.73m daily and increase by 0.645mper week until max dose of 1.8 mg dose achieved. (Patient taking differently: Inject into the skin every Wednesday.)  . [DISCONTINUED] Continuous Blood Gluc Receiver (FREESTYLE LIBRE 14 DAY READER) DEVI To check blood sugar ACHS  . [DISCONTINUED] Continuous Blood Gluc Sensor (FREESTYLE LIBRE 14 DAY SENSOR) MISC To check blood sugar ACHS; change every 14 days  . [DISCONTINUED] dicyclomine (BENTYL) 20 MG tablet TAKE 1 TABLET(20 MG) BY MOUTH FOUR TIMES DAILY BEFORE MEALS AND AT BEDTIME (Patient taking differently: Take  20 mg by mouth in the morning and at bedtime.)  . [DISCONTINUED] DULERA 200-5 MCG/ACT AERO Inhale 2 puffs into the lungs 2 (two) times daily as needed for wheezing or shortness of breath.  . [DISCONTINUED] mometasone (ELOCON) 0.1 % cream APPLY AS DIRECTED TO AFFECTED AREA ONCE DAILY. (Patient taking differently: Apply 1 application topically daily as needed.)  . [DISCONTINUED] pregabalin (LYRICA) 150 MG capsule TAKE 1 CAPSULE(150 MG) BY MOUTH TWICE DAILY (Patient taking differently: Take 150 mg by mouth 2 (two) times daily.)  . [DISCONTINUED] silver sulfADIAZINE (SILVADENE) 1 % cream Apply 1 application topically daily. Apply to right first toe (Patient taking differently: Apply 1 application topically daily as needed (pain).)  . [DISCONTINUED] torsemide (DEMADEX) 20 MG tablet TAKE (2) TABLETS BY MOUTH TWICE DAILY. (Patient taking differently: Take 40 mg by mouth 2 (two) times daily.)  . [DISCONTINUED] Vitamin D, Ergocalciferol, (DRISDOL) 1.25 MG (50000 UNIT) CAPS capsule TAKE 1 CAPSULE BY MOUTH ONCE A MONTH (Patient taking differently: Take 50,000 Units by mouth every 30 (thirty) days.)   No facility-administered medications prior to visit.    Review of Systems  Constitutional: Positive for fatigue.  Respiratory: Negative.   Cardiovascular: Negative.   Gastrointestinal: Positive for  abdominal pain.  Neurological: Negative.     Last CBC Lab Results  Component Value Date   WBC 5.5 09/27/2020   HGB 9.7 (L) 09/27/2020   HCT 28.9 (L) 09/27/2020   MCV 89 09/27/2020   MCH 29.8 09/27/2020   RDW 14.5 09/27/2020   PLT 209 68/05/5725   Last metabolic panel Lab Results  Component Value Date   GLUCOSE 195 (H) 09/27/2020   NA 139 09/27/2020   K 3.6 09/27/2020   CL 96 09/27/2020   CO2 25 09/27/2020   BUN 9 09/27/2020   CREATININE 4.01 (H) 09/27/2020   GFRNONAA 8 (L) 09/24/2020   GFRAA 6 (L) 01/31/2020   CALCIUM 8.4 (L) 09/27/2020   PHOS 3.8 09/22/2020   PROT 6.8 09/27/2020   ALBUMIN 4.0 09/27/2020   LABGLOB 2.8 09/27/2020   AGRATIO 1.4 09/27/2020   BILITOT 0.4 09/27/2020   ALKPHOS 299 (H) 09/27/2020   AST 41 (H) 09/27/2020   ALT 36 (H) 09/27/2020   ANIONGAP 15 09/24/2020       Objective    BP (!) 168/69   Pulse 97   Temp 98.3 F (36.8 C) (Oral)   Resp 16   Wt 275 lb 8 oz (125 kg)   SpO2 96%   BMI 41.89 kg/m  BP Readings from Last 3 Encounters:  09/27/20 (!) 168/69  09/24/20 (!) 156/64  09/14/20 123/88   Wt Readings from Last 3 Encounters:  09/27/20 275 lb 8 oz (125 kg)  09/23/20 277 lb 12.5 oz (126 kg)  09/14/20 277 lb 12.5 oz (126 kg)       Physical Exam Vitals reviewed.  Constitutional:      General: She is not in acute distress.    Appearance: Normal appearance. She is well-developed and well-groomed. She is obese. She is not ill-appearing or diaphoretic.  Cardiovascular:     Rate and Rhythm: Normal rate and regular rhythm.     Heart sounds: Normal heart sounds. No murmur heard. No friction rub. No gallop.   Pulmonary:     Effort: Pulmonary effort is normal. No respiratory distress.     Breath sounds: Normal breath sounds. No wheezing or rales.  Musculoskeletal:     Cervical back: Normal range of motion and neck supple.  Neurological:     Mental Status: She is alert.  Psychiatric:        Mood and Affect: Mood normal.         Behavior: Behavior is cooperative.        Thought Content: Thought content normal.     Results for orders placed or performed in visit on 09/27/20  CBC w/Diff/Platelet  Result Value Ref Range   WBC 5.5 3.4 - 10.8 x10E3/uL   RBC 3.25 (L) 3.77 - 5.28 x10E6/uL   Hemoglobin 9.7 (L) 11.1 - 15.9 g/dL   Hematocrit 28.9 (L) 34.0 - 46.6 %   MCV 89 79 - 97 fL   MCH 29.8 26.6 - 33.0 pg   MCHC 33.6 31.5 - 35.7 g/dL   RDW 14.5 11.7 - 15.4 %   Platelets 209 150 - 450 x10E3/uL   Neutrophils 57 Not Estab. %   Lymphs 28 Not Estab. %   Monocytes 10 Not Estab. %   Eos 3 Not Estab. %   Basos 1 Not Estab. %   Neutrophils Absolute 3.2 1.4 - 7.0 x10E3/uL   Lymphocytes Absolute 1.5 0.7 - 3.1 x10E3/uL   Monocytes Absolute 0.6 0.1 - 0.9 x10E3/uL   EOS (ABSOLUTE) 0.2 0.0 - 0.4 x10E3/uL   Basophils Absolute 0.0 0.0 - 0.2 x10E3/uL   Immature Granulocytes 1 Not Estab. %   Immature Grans (Abs) 0.0 0.0 - 0.1 x10E3/uL  Comprehensive Metabolic Panel (CMET)  Result Value Ref Range   Glucose 195 (H) 65 - 99 mg/dL   BUN 9 8 - 27 mg/dL   Creatinine, Ser 4.01 (H) 0.57 - 1.00 mg/dL   eGFR 12 (L) >59 mL/min/1.73   BUN/Creatinine Ratio 2 (L) 12 - 28   Sodium 139 134 - 144 mmol/L   Potassium 3.6 3.5 - 5.2 mmol/L   Chloride 96 96 - 106 mmol/L   CO2 25 20 - 29 mmol/L   Calcium 8.4 (L) 8.7 - 10.3 mg/dL   Total Protein 6.8 6.0 - 8.5 g/dL   Albumin 4.0 3.8 - 4.8 g/dL   Globulin, Total 2.8 1.5 - 4.5 g/dL   Albumin/Globulin Ratio 1.4 1.2 - 2.2   Bilirubin Total 0.4 0.0 - 1.2 mg/dL   Alkaline Phosphatase 299 (H) 44 - 121 IU/L   AST 41 (H) 0 - 40 IU/L   ALT 36 (H) 0 - 32 IU/L  HgB A1c  Result Value Ref Range   Hgb A1c MFr Bld 6.8 (H) 4.8 - 5.6 %   Est. average glucose Bld gHb Est-mCnc 148 mg/dL    Assessment & Plan     1. Diabetic hyperosmolar non-ketotic state ([place]) Improving. A1c down to 6.8. Continue medications prescribed by Endocrinology.  - Continuous Blood Gluc Sensor (FREESTYLE LIBRE 14 DAY SENSOR)  MISC; To check blood sugar ACHS; change every 14 days  Dispense: 6 each; Refill: 11 - Continuous Blood Gluc Receiver (FREESTYLE LIBRE 14 DAY READER) DEVI; To check blood sugar ACHS  Dispense: 1 each; Refill: 0 - AMB Referral to [place]  2. Type 2 diabetes mellitus with ESRD (end-stage renal disease) ([place]) New meter ordered for patient. Will check labs as below and f/u pending results. - Continuous Blood Gluc Sensor (FREESTYLE LIBRE 14 DAY SENSOR) MISC; To check blood sugar ACHS; change every 14 days  Dispense: 6 each; Refill: 11 - Continuous Blood Gluc Receiver (FREESTYLE LIBRE 14 DAY READER) DEVI; To check blood sugar ACHS  Dispense: 1 each; Refill: 0 - AMB Referral to [employer]  Care Coordinaton - CBC w/Diff/Platelet - Comprehensive Metabolic Panel (CMET) - HgB A1c  3. Diabetic polyneuropathy associated with type 2 diabetes mellitus ([place]) See above medical treatment plan. - Continuous Blood Gluc Sensor (FREESTYLE LIBRE 14 DAY SENSOR) MISC; To check blood sugar ACHS; change every 14 days  Dispense: 6 each; Refill: 11 - Continuous Blood Gluc Receiver (FREESTYLE LIBRE 14 DAY READER) DEVI; To check blood sugar ACHS  Dispense: 1 each; Refill: 0 - AMB Referral to [facility] - CBC w/Diff/Platelet - Comprehensive Metabolic Panel (CMET) - HgB A1c  4. Type 2 diabetes mellitus with diabetic neuropathy, with long-term current use of insulin (HCC) Stable. Diagnosis pulled for medication refill. Continue current medical treatment plan. Will check labs as below and f/u pending results. - pregabalin (LYRICA) 150 MG capsule; Take 1 capsule (150 mg total) by mouth 2 (two) times daily.  Dispense: 180 capsule; Refill: 1 - AMB Referral to [place] - CBC w/Diff/Platelet - Comprehensive Metabolic Panel (CMET) - HgB A1c  5. Vitamin D deficiency Stable. Diagnosis pulled for medication refill. Continue current medical treatment plan.  - Vitamin D,  Ergocalciferol, (DRISDOL) 1.25 MG (50000 UNIT) CAPS capsule; TAKE 1 CAPSULE BY MOUTH ONCE A MONTH  Dispense: 3 capsule; Refill: 1 - AMB Referral to [place]  6. Diarrhea, unspecified type Still having diarrhea despite colostomy. Will try Bentyl as below.  - dicyclomine (BENTYL) 20 MG tablet; Take 1 tablet (20 mg total) by mouth in the morning and at bedtime.  Dispense: 180 tablet; Refill: 1 - AMB Referral to [place]   Return in about 3 months (around 12/28/2020), or if symptoms worsen or fail to improve.      Reynolds Bowl, PA-C, have reviewed all documentation for this visit. The documentation on 10/15/20 for the exam, diagnosis, procedures, and orders are all accurate and complete.   Rubye Beach  [facility] [phone] (phone) [phone] (fax)  [place]

## 2020-09-27 NOTE — Telephone Encounter (Signed)
Left verbal order over phone. KW

## 2020-09-27 NOTE — Telephone Encounter (Signed)
Yes this is ok 

## 2020-09-28 LAB — COMPREHENSIVE METABOLIC PANEL
ALT: 36 IU/L — ABNORMAL HIGH (ref 0–32)
AST: 41 IU/L — ABNORMAL HIGH (ref 0–40)
Albumin/Globulin Ratio: 1.4 (ref 1.2–2.2)
Albumin: 4 g/dL (ref 3.8–4.8)
Alkaline Phosphatase: 299 IU/L — ABNORMAL HIGH (ref 44–121)
BUN/Creatinine Ratio: 2 — ABNORMAL LOW (ref 12–28)
BUN: 9 mg/dL (ref 8–27)
Bilirubin Total: 0.4 mg/dL (ref 0.0–1.2)
CO2: 25 mmol/L (ref 20–29)
Calcium: 8.4 mg/dL — ABNORMAL LOW (ref 8.7–10.3)
Chloride: 96 mmol/L (ref 96–106)
Creatinine, Ser: 4.01 mg/dL — ABNORMAL HIGH (ref 0.57–1.00)
Globulin, Total: 2.8 g/dL (ref 1.5–4.5)
Glucose: 195 mg/dL — ABNORMAL HIGH (ref 65–99)
Potassium: 3.6 mmol/L (ref 3.5–5.2)
Sodium: 139 mmol/L (ref 134–144)
Total Protein: 6.8 g/dL (ref 6.0–8.5)
eGFR: 12 mL/min/{1.73_m2} — ABNORMAL LOW (ref >59)

## 2020-09-28 LAB — HEMOGLOBIN A1C
Est. average glucose Bld gHb Est-mCnc: 148 mg/dL
Hgb A1c MFr Bld: 6.8 % — ABNORMAL HIGH (ref 4.8–5.6)

## 2020-09-28 LAB — CBC WITH DIFFERENTIAL/PLATELET
Basophils Absolute: 0 10*3/uL (ref 0.0–0.2)
Basos: 1 %
EOS (ABSOLUTE): 0.2 10*3/uL (ref 0.0–0.4)
Eos: 3 %
Hematocrit: 28.9 % — ABNORMAL LOW (ref 34.0–46.6)
Hemoglobin: 9.7 g/dL — ABNORMAL LOW (ref 11.1–15.9)
Immature Grans (Abs): 0 10*3/uL (ref 0.0–0.1)
Immature Granulocytes: 1 %
Lymphocytes Absolute: 1.5 10*3/uL (ref 0.7–3.1)
Lymphs: 28 %
MCH: 29.8 pg (ref 26.6–33.0)
MCHC: 33.6 g/dL (ref 31.5–35.7)
MCV: 89 fL (ref 79–97)
Monocytes Absolute: 0.6 10*3/uL (ref 0.1–0.9)
Monocytes: 10 %
Neutrophils Absolute: 3.2 10*3/uL (ref 1.4–7.0)
Neutrophils: 57 %
Platelets: 209 10*3/uL (ref 150–450)
RBC: 3.25 x10E6/uL — ABNORMAL LOW (ref 3.77–5.28)
RDW: 14.5 % (ref 11.7–15.4)
WBC: 5.5 10*3/uL (ref 3.4–10.8)

## 2020-09-30 ENCOUNTER — Telehealth: Payer: Self-pay | Admitting: *Deleted

## 2020-09-30 NOTE — Chronic Care Management (AMB) (Signed)
  Chronic Care Management   Note  09/30/2020 Name: CAROLY PUREWAL MRN: [id number] DOB: 06-30-1956  [name] is a 65 y.o. year old female who is a primary care patient of Rubye Beach. I reached out to [name] by phone today in response to a referral sent by Ms. Terrilee Croak [place]'s PCP, [name], PA-C     Ms. Caponi was given information about Chronic Care Management services today including:  1. CCM service includes personalized support from designated clinical staff supervised by her physician, including individualized plan of care and coordination with other care providers 2. 24/7 contact phone numbers for assistance for urgent and routine care needs. 3. Service will only be billed when office clinical staff spend 20 minutes or more in a month to coordinate care. 4. Only one practitioner may furnish and bill the service in a calendar month. 5. The patient may stop CCM services at any time (effective at the end of the month) by phone call to the office staff. 6. The patient will be responsible for cost sharing (co-pay) of up to 20% of the service fee (after annual deductible is met).  Patient agreed to services and verbal consent obtained.   Follow up plan: Telephone appointment with care management team member scheduled for:10/04/2020   [facility]

## 2020-09-30 NOTE — Telephone Encounter (Signed)
I have tried to contact this pt on two separate occassions and left a VM requesting a CB. Pt has not returned my calls or VM. No HFU scheduled! FYI to PCP. -MM

## 2020-10-01 ENCOUNTER — Telehealth: Payer: Self-pay

## 2020-10-01 NOTE — Progress Notes (Signed)
Chronic Care Management Pharmacy Assistant   Name: [name]  MRN: [id number] DOB: 1956/03/23   Reason for Encounter: Medication Review/Initial questions for initial visit on 10/04/2020 with Clinical Pharmacist.    Medications: Outpatient Encounter Medications as of 10/01/2020  Medication Sig   albuterol (VENTOLIN HFA) 108 (90 Base) MCG/ACT inhaler Inhale 2 puffs into the lungs every 4 (four) hours as needed for wheezing or shortness of breath.   [START ON 10/08/2020] allopurinol (ZYLOPRIM) 100 MG tablet Take 1 tablet (100 mg total) by mouth daily.   aspirin 81 MG EC tablet Take 1 tablet (81 mg total) by mouth daily.   atorvastatin (LIPITOR) 80 MG tablet TAKE 1 TABLET BY MOUTH ONCE DAILY AT 6PM (Patient taking differently: Take 80 mg by mouth every evening.)   AURYXIA 1 GM 210 MG(Fe) tablet Take 2 tablets (420 mg total) by mouth 3 (three) times daily with meals.   B Complex-C-Folic Acid (RENA-VITE RX PO) Take 1 mg by mouth daily.   buPROPion (WELLBUTRIN SR) 150 MG 12 hr tablet Take 1 tablet (150 mg total) by mouth 2 (two) times daily.   carvedilol (COREG) 25 MG tablet TAKE (1) TABLET BY MOUTH TWICE A DAY WITH MEALS (BREAKFAST AND SUPPER) (Patient taking differently: Take 25 mg by mouth 2 (two) times daily with a meal.)   Cinacalcet HCl (SENSIPAR PO) Take 30 mg by mouth every dialysis.   colchicine 0.6 MG tablet Take 1 tablet (0.6 mg total) by mouth once a week for 15 days.   Continuous Blood Gluc Receiver (FREESTYLE LIBRE 14 DAY READER) DEVI To check blood sugar ACHS   Continuous Blood Gluc Sensor (FREESTYLE LIBRE 14 DAY SENSOR) MISC To check blood sugar ACHS; change every 14 days   cyclobenzaprine (FLEXERIL) 5 MG tablet Take 1 tablet (5 mg total) by mouth 3 (three) times daily as needed for muscle spasms.   dicyclomine (BENTYL) 20 MG tablet Take 1 tablet (20 mg total) by mouth in the morning and at bedtime.   docusate sodium (COLACE) 100 MG capsule Take 1 capsule (100  mg total) by mouth 2 (two) times daily as needed for mild constipation.   doxepin (SINEQUAN) 10 MG capsule TAKE 1 CAPSULE(10 MG) BY MOUTH FOUR TIMES DAILY AS NEEDED FOR ITCHING (Patient taking differently: Take 10 mg by mouth 2 (two) times daily.)   fluticasone (FLONASE) 50 MCG/ACT nasal spray Place 2 sprays into both nostrils daily as needed for allergies or rhinitis.   fluticasone furoate-vilanterol (BREO ELLIPTA) 200-25 MCG/INH AEPB Inhale 1 puff into the lungs daily.   hydrOXYzine (ATARAX/VISTARIL) 25 MG tablet Take 1 tablet (25 mg total) by mouth 4 (four) times daily. (Patient taking differently: Take 25 mg by mouth in the morning and at bedtime.)   insulin detemir (LEVEMIR FLEXTOUCH) 100 UNIT/ML FlexPen Inject 60 Units into the skin 2 (two) times daily.   insulin glargine (LANTUS) 100 unit/mL SOPN Inject 25 Units into the skin 2 (two) times daily.   insulin lispro (HUMALOG KWIKPEN) 100 UNIT/ML KwikPen Inject 25 Units into the skin with breakfast, with lunch, and with evening meal. (Patient taking differently: Inject 7 Units into the skin 2 (two) times daily.)   lidocaine-prilocaine (EMLA) cream Apply 1 application topically as needed. (Patient taking differently: Apply 1 application topically as needed (dialysis days).)   loperamide (IMODIUM A-D) 2 MG tablet Take 1 tablet (2 mg total) by mouth as needed (diarrhea).   loratadine (CLARITIN) 10 MG tablet TAKE 1 TABLET BY MOUTH ONCE  DAILY. (Patient taking differently: Take 10 mg by mouth daily as needed for allergies.)   montelukast (SINGULAIR) 10 MG tablet TAKE ONE TABLET BY MOUTH AT BEDTIME. (Patient taking differently: Take 10 mg by mouth at bedtime as needed (allergies).)   nitroGLYCERIN (NITROSTAT) 0.4 MG SL tablet Place 1 tablet (0.4 mg total) under the tongue every 5 (five) minutes x 3 doses as needed for chest pain.   Nutritional Supplements (FEEDING SUPPLEMENT, NEPRO CARB STEADY,) LIQD Take 237 mLs by mouth daily.   nystatin  cream (MYCOSTATIN) Apply 1 application topically 2 (two) times daily. (Patient taking differently: Apply 1 application topically daily as needed for dry skin.)   oxyCODONE (ROXICODONE) 5 MG immediate release tablet Take 1 tablet (5 mg total) by mouth every 12 (twelve) hours as needed for severe pain.   pantoprazole (PROTONIX) 40 MG tablet Take 1 tablet (40 mg total) by mouth 2 (two) times daily.   pregabalin (LYRICA) 150 MG capsule Take 1 capsule (150 mg total) by mouth 2 (two) times daily.   SENNA-PLUS 8.6-50 MG tablet TAKE ONE TABLET BY MOUTH AT BEDTIME.   sevelamer carbonate (RENVELA) 800 MG tablet Take 800 mg by mouth 3 (three) times daily.   sucralfate (CARAFATE) 1 g tablet Take 1 tablet (1 g total) by mouth 2 (two) times daily for 26 days.   torsemide (DEMADEX) 20 MG tablet TAKE (2) TABLETS BY MOUTH TWICE DAILY. (Patient taking differently: Take 40 mg by mouth 2 (two) times daily.)   triamcinolone cream (KENALOG) 0.1 % APPLY TO AFFECTED AREA TWICE DAILY (Patient taking differently: Apply 1 application topically 2 (two) times daily as needed (rash).)   valACYclovir (VALTREX) 500 MG tablet TAKE (1) TABLET BY MOUTH EVERY OTHER DAY. (Patient taking differently: Take 500 mg by mouth every other day.)   VICTOZA 18 MG/3ML SOPN Start with 0.6mg  daily and increase by 0.6mg  per week until max dose of 1.8 mg dose achieved. (Patient taking differently: Inject into the skin every Wednesday.)   Vitamin D, Ergocalciferol, (DRISDOL) 1.25 MG (50000 UNIT) CAPS capsule TAKE 1 CAPSULE BY MOUTH ONCE A MONTH   [DISCONTINUED] gabapentin (NEURONTIN) 100 MG capsule Take 1 capsule (100 mg total) by mouth 3 (three) times daily.   No facility-administered encounter medications on file as of 10/01/2020.      Star Rating Drugs:atorvastatin,  Left Voice message to do initial question prior to patient appointment on 10/04/2020  for CCM with [name] the Clinical pharmacist.    Please bring medications  and supplements to appointment   [facility] Pharmacist Assistant [phone]

## 2020-10-02 NOTE — Progress Notes (Deleted)
Chronic Care Management Pharmacy Note  10/02/2020 Name:  [name] MRN:  [id number] DOB:  28-May-1956  Subjective: [name] is an 65 y.o. year old female who is a primary patient of [name], [place].  The CCM team was consulted for assistance with disease management and care coordination needs.    Engaged with patient by telephone for initial visit in response to provider referral for pharmacy case management and/or care coordination services.   Consent to Services:  The patient was given the following information about Chronic Care Management services today, agreed to services, and gave verbal consent: 1. CCM service includes personalized support from designated clinical staff supervised by the primary care provider, including individualized plan of care and coordination with other care providers 2. 24/7 contact phone numbers for assistance for urgent and routine care needs. 3. Service will only be billed when office clinical staff spend 20 minutes or more in a month to coordinate care. 4. Only one practitioner may furnish and bill the service in a calendar month. 5.The patient may stop CCM services at any time (effective at the end of the month) by phone call to the office staff. 6. The patient will be responsible for cost sharing (co-pay) of up to 20% of the service fee (after annual deductible is met). Patient agreed to services and consent obtained.  Patient Care Team: Rubye Beach as PCP - General (Family Medicine) Sherren Mocha, MD as PCP - Cardiology (Cardiology) Edrick Kins, DPM as Consulting Physician (Podiatry) Germaine Pomfret, [facility] (Pharmacist)  Recent office visits: 09/27/20: Patient presented to Dr. Marlyn Corporal for follow-up. Nasonex stopped. Dicyclomine, pregabalin refilled.   Recent consult visits: [facility] visits: 3/8-3/15//22: Patient hospitalized for Upper GI Bleed 3/4-09/14/20: Patient hospitalized for acute respiratory failure.   09/03/20: Patient presented to ED for altered conciousness.   Objective:  Lab Results  Component Value Date   CREATININE 4.01 (H) 09/27/2020   BUN 9 09/27/2020   GFR 56.38 (L) 10/11/2014   GFRNONAA 8 (L) 09/24/2020   GFRAA 6 (L) 01/31/2020   NA 139 09/27/2020   K 3.6 09/27/2020   CALCIUM 8.4 (L) 09/27/2020   CO2 25 09/27/2020   GLUCOSE 195 (H) 09/27/2020    Lab Results  Component Value Date/Time   HGBA1C 6.8 (H) 09/27/2020 02:17 PM   HGBA1C 8.4 (H) 09/13/2020 05:56 AM   GFR 56.38 (L) 10/11/2014 10:17 AM   MICROALBUR 237.2 (H) 11/08/2014 10:18 AM    Last diabetic Eye exam:  Lab Results  Component Value Date/Time   HMDIABEYEEXA Retinopathy (A) 10/14/2017 12:00 AM    Last diabetic Foot exam: No results found for: HMDIABFOOTEX   Lab Results  Component Value Date   CHOL 122 09/13/2016   HDL 36 (L) 09/13/2016   LDLCALC 52 09/13/2016   TRIG 168 (H) 09/13/2016   CHOLHDL 3.4 09/13/2016    Hepatic Function Latest Ref Rng & Units 09/27/2020 09/22/2020 09/21/2020  Total Protein 6.0 - 8.5 g/dL 6.8 - -  Albumin 3.8 - 4.8 g/dL 4.0 2.5(L) 2.5(L)  AST 0 - 40 IU/L 41(H) - -  ALT 0 - 32 IU/L 36(H) - -  Alk Phosphatase 44 - 121 IU/L 299(H) - -  Total Bilirubin 0.0 - 1.2 mg/dL 0.4 - -  Bilirubin, Direct 0.0 - 0.2 mg/dL - - -    Lab Results  Component Value Date/Time   TSH 2.096 01/31/2020 01:48 AM   TSH 1.560 12/28/2018 11:16 PM  TSH 1.254 03/26/2012 12:25 PM   TSH 3.858 ***Test methodology is 3rd generation TSH*** 04/05/2008 10:20 PM    CBC Latest Ref Rng & Units 09/27/2020 09/23/2020 09/22/2020  WBC 3.4 - 10.8 x10E3/uL 5.5 6.0 5.5  Hemoglobin 11.1 - 15.9 g/dL 9.7(L) 8.8(L) 7.2(L)  Hematocrit 34.0 - 46.6 % 28.9(L) 27.2(L) 21.9(L)  Platelets 150 - 450 x10E3/uL 209 193 175    No results found for: VD25OH  Clinical ASCVD: Yes  The ASCVD Risk score Mikey Bussing DC Jr., et al., 2013) failed to calculate for the following reasons:   The valid total cholesterol range is 130 to 320  mg/dL    Depression screen [facility] 2/9 06/28/2020 05/31/2020 12/26/2019  Decreased Interest 1 3 0  Down, Depressed, Hopeless 1 2 1   PHQ - 2 Score 2 5 1   Altered sleeping 1 2 -  Tired, decreased energy 1 3 -  Change in appetite 1 3 -  Feeling bad or failure about yourself  3 1 -  Trouble concentrating 1 3 -  Moving slowly or fidgety/restless 1 3 -  Suicidal thoughts 0 0 -  PHQ-9 Score 10 20 -  Difficult doing work/chores Not difficult at all Very difficult -  Some recent data might be hidden     ***Other: (CHADS2VASc if Afib, MMRC or CAT for COPD, ACT, DEXA)  Social History   Tobacco Use  Smoking Status Former Smoker  . Packs/day: 0.25  . Years: 6.00  . Pack years: 1.50  . Types: Cigarettes  . Quit date: 10/25/1980  . Years since quitting: 39.9  Smokeless Tobacco Never Used   BP Readings from Last 3 Encounters:  09/27/20 (!) 168/69  09/24/20 (!) 156/64  09/14/20 123/88   Pulse Readings from Last 3 Encounters:  09/27/20 97  09/24/20 88  09/14/20 84   Wt Readings from Last 3 Encounters:  09/27/20 275 lb 8 oz (125 kg)  09/23/20 277 lb 12.5 oz (126 kg)  09/14/20 277 lb 12.5 oz (126 kg)   BMI Readings from Last 3 Encounters:  09/27/20 41.89 kg/m  09/23/20 42.24 kg/m  09/14/20 42.24 kg/m    Assessment/Interventions: Review of patient past medical history, allergies, medications, health status, including review of consultants reports, laboratory and other test data, was performed as part of comprehensive evaluation and provision of chronic care management services.   SDOH:  (Social Determinants of Health) assessments and interventions performed: {yes/no:20286}   CCM Care Plan  Allergies  Allergen Reactions  . Morphine Itching    Tolerated hydromorphone on 07/2020  . Shellfish Allergy Anaphylaxis and Swelling  . Diazepam Other (See Comments)    "felt like out of body experience"    Medications Reviewed Today    Reviewed by Rubye Beach (Physician  Assistant Certified) on 81/82/99 at [place] List Status: <None>  Medication Order Taking? Sig Documenting Provider Last Dose Status Informant  albuterol (VENTOLIN HFA) 108 (90 Base) MCG/ACT inhaler [id number] Yes Inhale 2 puffs into the lungs every 4 (four) hours as needed for wheezing or shortness of breath. [name], PA-C Taking Active Self  allopurinol (ZYLOPRIM) 100 MG tablet [id number] Yes Take 1 tablet (100 mg total) by mouth daily. Thurnell Lose, MD Taking Active   aspirin 81 MG EC tablet [id number] Yes Take 1 tablet (81 mg total) by mouth daily. Thurnell Lose, MD Taking Active   atorvastatin (LIPITOR) 80 MG tablet [id number] Yes TAKE 1 TABLET BY MOUTH ONCE DAILY AT 6PM  Patient taking  differently: Take 80 mg by mouth every evening.   [name] M, PA-C Taking Active   AURYXIA 1 GM 210 MG(Fe) tablet [id number] Yes Take 2 tablets (420 mg total) by mouth 3 (three) times daily with meals. [name], PA-C Taking Active Self  B Complex-C-Folic Acid (RENA-VITE RX PO) [id number] Yes Take 1 mg by mouth daily. [provider] Taking Active Self  buPROPion (WELLBUTRIN SR) 150 MG 12 hr tablet [id number] Yes Take 1 tablet (150 mg total) by mouth 2 (two) times daily. [name], PA-C Taking Active Self  carvedilol (COREG) 25 MG tablet [id number] Yes TAKE (1) TABLET BY MOUTH TWICE A DAY WITH MEALS (BREAKFAST AND SUPPER)  Patient taking differently: Take 25 mg by mouth 2 (two) times daily with a meal.   [name], PA-C Taking Active   Cinacalcet HCl (SENSIPAR PO) [id number] Yes Take 30 mg by mouth every dialysis. [provider] Taking Active Self  colchicine 0.6 MG tablet [id number] Yes Take 1 tablet (0.6 mg total) by mouth once a week for 15 days. Thurnell Lose, MD Taking Active   Continuous Blood Gluc Receiver (FREESTYLE LIBRE 14 DAY READER) [place] [id number]  To check blood sugar ACHS [name], PA-C  Active   Continuous  Blood Gluc Sensor (FREESTYLE LIBRE 14 DAY SENSOR) [place] [id number]  To check blood sugar ACHS; change every 14 days [name] M, [place]  Active   cyclobenzaprine (FLEXERIL) 5 MG tablet [id number] Yes Take 1 tablet (5 mg total) by mouth 3 (three) times daily as needed for muscle spasms. [name], PA-C Taking Active Self  dicyclomine (BENTYL) 20 MG tablet [id number]  Take 1 tablet (20 mg total) by mouth in the morning and at bedtime. [name] M, PA-C  Active   docusate sodium (COLACE) 100 MG capsule [id number] Yes Take 1 capsule (100 mg total) by mouth 2 (two) times daily as needed for mild constipation. Nicholes Mango, MD Taking Active Self  doxepin (SINEQUAN) 10 MG capsule [id number] Yes TAKE 1 CAPSULE(10 MG) BY MOUTH FOUR TIMES DAILY AS NEEDED FOR ITCHING  Patient taking differently: Take 10 mg by mouth 2 (two) times daily.   [name] M, PA-C Taking Active   fluticasone [facility]) 50 MCG/ACT nasal spray [id number] Yes Place 2 sprays into both nostrils daily as needed for allergies or rhinitis. [name], PA-C Taking Active Self  fluticasone furoate-vilanterol (BREO ELLIPTA) 200-25 MCG/INH AEPB [id number] Yes Inhale 1 puff into the lungs daily. [name], [place] Taking Active Self        Discontinued 09/01/17 1623 (Dose change)   hydrOXYzine (ATARAX/VISTARIL) 25 MG tablet [id number] Yes Take 1 tablet (25 mg total) by mouth 4 (four) times daily.  Patient taking differently: Take 25 mg by mouth in the morning and at bedtime.   [name], PA-C Taking Active Self  insulin detemir (LEVEMIR FLEXTOUCH) 100 UNIT/ML FlexPen [id number] Yes Inject 60 Units into the skin 2 (two) times daily. [name], PA-C Taking Active Self  insulin glargine (LANTUS) 100 unit/mL SOPN [id number] Yes Inject 25 Units into the skin 2 (two) times daily. [provider] Taking Active Self  insulin lispro (HUMALOG KWIKPEN) 100 UNIT/ML KwikPen [id number] Yes  Inject 25 Units into the skin with breakfast, with lunch, and with evening meal.  Patient taking differently: Inject 7 Units into the skin 2 (two) times daily.   [name], [place] Taking Active   lidocaine-prilocaine (EMLA) cream [id number] Yes Apply  1 application topically as needed.  Patient taking differently: Apply 1 application topically as needed (dialysis days).   Schnier, Dolores Lory, MD Taking Active   loperamide (IMODIUM A-D) 2 MG tablet [id number] Yes Take 1 tablet (2 mg total) by mouth as needed (diarrhea). [provider] Taking Active Self  loratadine (CLARITIN) 10 MG tablet [id number] Yes TAKE 1 TABLET BY MOUTH ONCE DAILY.  Patient taking differently: Take 10 mg by mouth daily as needed for allergies.   [name] M, PA-C Taking Active   montelukast (SINGULAIR) 10 MG tablet [id number] Yes TAKE ONE TABLET BY MOUTH AT BEDTIME.  Patient taking differently: Take 10 mg by mouth at bedtime as needed (allergies).   [name] M, PA-C Taking Active   nitroGLYCERIN (NITROSTAT) 0.4 MG SL tablet [id number] Yes Place 1 tablet (0.4 mg total) under the tongue every 5 (five) minutes x 3 doses as needed for chest pain. [name], PA-C Taking Active Self  Nutritional Supplements (FEEDING SUPPLEMENT, NEPRO CARB STEADY,) LIQD [id number] Yes Take 237 mLs by mouth daily. Loletha Grayer, MD Taking Active Self  nystatin cream (MYCOSTATIN) [id number] Yes Apply 1 application topically 2 (two) times daily.  Patient taking differently: Apply 1 application topically daily as needed for dry skin.   [name], PA-C Taking Active   oxyCODONE (ROXICODONE) 5 MG immediate release tablet [id number] Yes Take 1 tablet (5 mg total) by mouth every 12 (twelve) hours as needed for severe pain. [name], PA-C Taking Active Self  pantoprazole (PROTONIX) 40 MG tablet [id number] Yes Take 1 tablet (40 mg total) by mouth 2 (two) times daily. Thurnell Lose, MD Taking  Active   pregabalin (LYRICA) 150 MG capsule [id number]  Take 1 capsule (150 mg total) by mouth 2 (two) times daily. Rubye Beach  Active   SENNA-PLUS 8.6-50 MG tablet [id number] Yes TAKE ONE TABLET BY MOUTH AT BEDTIME. [name], PA-C Taking Active Self  sevelamer carbonate (RENVELA) 800 MG tablet [id number] Yes Take 800 mg by mouth 3 (three) times daily. [provider] Taking Active Self  sucralfate (CARAFATE) 1 g tablet [id number] Yes Take 1 tablet (1 g total) by mouth 2 (two) times daily for 26 days. Thurnell Lose, MD Taking Active   torsemide (DEMADEX) 20 MG tablet [id number] Yes TAKE (2) TABLETS BY MOUTH TWICE DAILY.  Patient taking differently: Take 40 mg by mouth 2 (two) times daily.   [name] M, PA-C Taking Active   triamcinolone cream (KENALOG) 0.1 % [id number] Yes APPLY TO AFFECTED AREA TWICE DAILY  Patient taking differently: Apply 1 application topically 2 (two) times daily as needed (rash).   [name] M, PA-C Taking Active   valACYclovir (VALTREX) 500 MG tablet [id number] Yes TAKE (1) TABLET BY MOUTH EVERY OTHER DAY.  Patient taking differently: Take 500 mg by mouth every other day.   [name], PA-C Taking Active   VICTOZA 18 MG/3ML SOPN [id number] Yes Start with 0.$RemoveBefo'6mg'MhuHHWHUbpi$  daily and increase by 0.$RemoveBefor'6mg'MnQsKNYrqRbI$  per week until max dose of 1.8 mg dose achieved.  Patient taking differently: Inject into the skin every Wednesday.   [name], PA-C Taking Active Self  Vitamin D, Ergocalciferol, (DRISDOL) 1.25 MG (50000 UNIT) CAPS capsule [id number]  TAKE 1 CAPSULE BY MOUTH ONCE A MONTH [place]ette, Clearnce Sorrel, PA-C  Active   Med List Note Gentry Roch, CPhT 09/18/19 8921): Dialysis [facility] Saturday          Patient Active Problem List  Diagnosis Date Noted  . Upper GI bleed 09/17/2020  . Prolonged QT interval 09/17/2020  . COPD (chronic obstructive pulmonary disease) ([place]) 09/17/2020  . Acute pulmonary edema ([place])  09/14/2020  . Acute respiratory failure with hypoxia ([place]) 09/13/2020  . Allergy, unspecified, initial encounter 03/29/2020  . Anaphylactic shock, unspecified, initial encounter 03/29/2020  . Volume overload 01/30/2020  . Pain in left upper arm 01/18/2020  . Cellulitis 01/16/2020  . Anemia due to end stage renal disease ([place]) 01/16/2020  . Diabetic polyneuropathy associated with type 2 diabetes mellitus ([place]) 11/06/2019  . Nystagmus 11/06/2019  . Intermittent claudication ([place]) 11/06/2019  . Vertigo of central origin 11/06/2019  . Weakness   . Hypervolemia associated with renal insufficiency 10/31/2019  . BMI 45.0-49.9, adult ([place]) 10/18/2019  . Ascending aortic aneurysm ([place]) 03/27/2019  . Puncture wound without foreign body of left forearm, initial encounter 01/02/2019  . Non-healing wound of upper extremity 12/28/2018  . Unspecified open wound of left upper arm, initial encounter 12/06/2018  . Complication from renal dialysis device 10/20/2018  . Iron deficiency anemia, unspecified 10/18/2018  . ESRD (end stage renal disease) on dialysis ([place]) 06/01/2018  . ARF (acute renal failure) ([place]) 05/09/2018  . Colon cancer screening   . LGSIL on Pap smear of cervix 01/27/2018  . Gout 12/25/2017  . AKI (acute kidney injury) ([place]) 12/24/2017  . Hyperglycemia 12/24/2017  . Chronic diastolic heart failure ([place]) 09/30/2017  . Lymphedema 09/30/2017  . Aortic stenosis   . Acute pain of both knees 02/12/2017  . Chronic gout due to renal impairment of multiple sites without tophus 02/12/2017  . Esophageal dysphagia 02/12/2017  . Osteoarthritis 01/22/2017  . Diabetic hyperosmolar non-ketotic state ([place]) 12/13/2016  . CAD (coronary artery disease) 12/13/2016  . Hyperlipidemia 12/13/2016  . Hyperphosphatemia 12/13/2016  . Acute diastolic CHF (congestive heart failure) ([place])   . Hyperkalemia 08/21/2016  . Asthma 11/29/2015  . Depression 09/05/2015  . GERD (gastroesophageal reflux disease)  03/25/2015  . Environmental allergies 03/14/2015  . SOB (shortness of breath) 02/15/2015  . Chronic hepatitis C without hepatic coma ([place]) 01/31/2015  . Diabetic neuropathy ([place]) 01/31/2015  . OSA (obstructive sleep apnea) 01/01/2015  . Poor dentition 11/13/2014  . Essential hypertension 10/08/2014  . Morbid (severe) obesity due to excess calories ([place]) 10/08/2014  . Localized, primary osteoarthritis 01/25/2014  . Family history of diabetes mellitus 03/31/2013  . Pain in limb 03/31/2013  . Uncontrolled type 2 diabetes mellitus with hyperosmolar nonketotic hyperglycemia ([place]) 03/25/2012    Class: Chronic    Immunization History  Administered Date(s) Administered  . Hepatitis B, adult 06/30/2018, 07/28/2018, 09/01/2018  . Hepatitis B, ped/adol 06/30/2018, 07/28/2018, 09/01/2018  . Influenza Split 03/27/2012  . Influenza,inj,Quad PF,6+ Mos 10/03/2014, 04/08/2015, 05/19/2016, 04/29/2018, 04/06/2019, 05/31/2020  . Moderna Sars-Covid-2 Vaccination 08/20/2020  . Pneumococcal Conjugate-13 07/07/2018  . Pneumococcal Polysaccharide-23 03/26/2012, 09/20/2018  . Tdap 06/18/2014    Conditions to be addressed/monitored:  Hypertension, Hyperlipidemia, Diabetes, Heart Failure, Coronary Artery Disease, GERD, COPD, Asthma, Depression and Chronic Hepatitis C, End Stage renal disease  There are no care plans that you recently modified to display for this patient.    Medication Assistance: {MEDASSISTANCEINFO:25044}  Patient's preferred pharmacy is:  [id number] [address], [place] AT [address] [place] [address] Phone: [phone] Fax: [place], [place] - [address] [place] [address] Phone: [phone] Fax: [phone]  Uses pill box? {Yes or  If no, why not?:20788} Pt endorses ***% compliance  We discussed: {Pharmacy options:24294} Patient decided to: {US Pharmacy  Plan:23885}  Care Plan and Follow Up Patient Decision:  {FOLLOWUP:24991}  Plan: {CM FOLLOW UP PLAN:25073}  ***   Current Barriers:  . {pharmacybarriers:24917} . ***  Pharmacist Clinical Goal(s):  [name] Patient will {PHARMACYGOALCHOICES:24921} through collaboration with PharmD and provider.  . ***  Interventions: . 1:1 collaboration with [name], PA-C regarding development and update of comprehensive plan of care as evidenced by provider attestation and co-signature . Inter-disciplinary care team collaboration (see longitudinal plan of care) . Comprehensive medication review performed; medication list updated in electronic medical record  Hyperlipidemia: (LDL goal < ***) -{US controlled/uncontrolled:25276} -Current treatment: . Atorvastatin 80 mg daily  -Medications previously tried: ***  -Current dietary patterns: *** -Current exercise habits: *** -Educated on {CCM HLD Counseling:25126} -{CCMPHARMDINTERVENTION:25122}  Diabetes (A1c goal {A1c goals:23924}) -{US controlled/uncontrolled:25276} -Current medications: . Levemir 60 units twice daily  . Lantus 25 units twice daily  . Humalog 25 units three times daily?  . Victoza 1.8 mg daily?  -Medications previously tried: ***  -Current home glucose readings . fasting glucose: *** . post prandial glucose: *** -{ACTIONS;DENIES/REPORTS:21021675::"Denies"} hypoglycemic/hyperglycemic symptoms -Current meal patterns:  . breakfast: ***  . lunch: ***  . dinner: *** . snacks: *** . drinks: *** -Current exercise: *** -Educated on {CCM DM COUNSELING:25123} -Counseled to check feet daily and get yearly eye exams -{CCMPHARMDINTERVENTION:25122}  Heart Failure (Goal: manage symptoms and prevent exacerbations) -{US controlled/uncontrolled:25276} -Last ejection fraction: *** (Date: ***) -HF type: {type of heart failure:30421350} -NYHA Class: {CHL HP Upstream Pharm NYHA [phone]} -AHA HF Stage: {CHL HP Upstream  Pharm AHA HF Stage:[phone]} -Current treatment: . Carvedilol 25 mg twice daily  . Torsemide 20 mg two tablets twice daily -Medications previously tried: ***  -Current home BP/HR readings: *** -Current dietary habits: *** -Current exercise habits: *** -Educated on {CCM HF Counseling:25125} -{CCMPHARMDINTERVENTION:25122}  COPD (Goal: control symptoms and prevent exacerbations) -{US controlled/uncontrolled:25276} -Current treatment  . Ventolin HFA 2 puffs every 4 hours as needed  . Breo Ellipta 1 puff daily  . Montelukast 10 mg nightly  -Medications previously tried: ***  -Gold Grade: {CHL HP Upstream Pharm COPD Gold YIRSW:[id number]} -Current COPD Classification:  {CHL HP Upstream Pharm COPD Classification:[phone]} -MMRC/CAT score: *** -Pulmonary function testing: *** -Exacerbations requiring treatment in last 6 months: *** -Patient {Actions; denies-reports:120008} consistent use of maintenance inhaler -Frequency of rescue inhaler use: *** -Counseled on {CCMINHALERCOUNSELING:25121} -{CCMPHARMDINTERVENTION:25122}   Patient Goals/Self-Care Activities . Patient will:  - {pharmacypatientgoals:24919}  Follow Up Plan: {CM FOLLOW UP XFGH:82993}

## 2020-10-03 ENCOUNTER — Telehealth: Payer: Self-pay | Admitting: *Deleted

## 2020-10-03 ENCOUNTER — Telehealth: Payer: Self-pay

## 2020-10-03 ENCOUNTER — Telehealth: Payer: Self-pay | Admitting: Physician Assistant

## 2020-10-03 NOTE — Telephone Encounter (Signed)
Yes this is ok 

## 2020-10-03 NOTE — Telephone Encounter (Signed)
Tired calling patient and no answer or could not leave a vm for patient.

## 2020-10-03 NOTE — Telephone Encounter (Signed)
-----   Message from [name], [place] sent at 10/03/2020  8:34 AM EDT ----- Davina Poke,  Currently labs are all looking really good. Hemoglobin is back up to 9.7, Creatinine is down to 4.01, and A1c is doing very good down to 6.8.   Best Wishes, Grace Bushy, PAC

## 2020-10-03 NOTE — Telephone Encounter (Signed)
[name] PT with [facility] is calling and extended PT orders 1x3

## 2020-10-03 NOTE — Telephone Encounter (Signed)
Left detailed message on [facility] secure voice message system.

## 2020-10-03 NOTE — Progress Notes (Signed)
Outreach unsuccessful to make patient aware of copay for CCM prior appointment on 10/04/2020.   [place], [place] [address] Direct Dial: [phone] [email or web address]: [email or web address]

## 2020-10-04 ENCOUNTER — Telehealth: Payer: Medicare Other

## 2020-10-07 ENCOUNTER — Ambulatory Visit: Payer: Medicare Other | Admitting: Cardiovascular Disease

## 2020-10-07 ENCOUNTER — Other Ambulatory Visit: Payer: Self-pay | Admitting: Physician Assistant

## 2020-10-07 DIAGNOSIS — I5032 Chronic diastolic (congestive) heart failure: Secondary | ICD-10-CM

## 2020-10-07 DIAGNOSIS — E559 Vitamin D deficiency, unspecified: Secondary | ICD-10-CM

## 2020-10-07 NOTE — Progress Notes (Deleted)
Cardiology Office Note:    Date:  10/07/2020   ID:  [name], DOB 11-Feb-1956, MRN [id number]  PCP:  Rubye Beach   [facility]  Cardiologist:  Sherren Mocha, MD *** Advanced Practice Provider:  No care team member to display Electrophysiologist:  None  {Press F2 to show EP APP, CHF, sleep or structural heart MD               :[id number]}  { Click here to update then REFRESH NOTE - MD (PCP) or APP (Team Member)  Change PCP Type for MD, Specialty for APP is either Cardiology or Clinical Cardiac Electrophysiology  :[id number]}   Referring MD: [name], Mamie Nick*   No chief complaint on file. ***  History of Present Illness:    [name] is a 65 y.o. female with a hx of CAD, presenting for follow-up evaluation. She initially presented in 2018 with NSTEMI and underwent stenting of severe stenosis in the proximal LAD. She was noted to have mild-moderate nonobstructive CAD in the circumflex, OM, and diagonal branches. The patient has multiple comorbid medical problems including ESRD, uncontrolled diabetes, GI bleeding, recurrent perineal necrotizing fasciitis, and colostomy placement. She was recently hospitalized with GI bleeding and ASA was held.   Past Medical History:  Diagnosis Date  . Anemia   . Aortic stenosis    Echo 8/18: mean 13, peak 28, LVOT/AV mean velocity 0.51  . Arthritis   . Asthma    As a child   . Bronchitis   . CAD (coronary artery disease)    a. 09/2016: 50% Ost 1st Mrg stenosis, 50% 2nd Mrg stenosis, 20% Mid-Cx, 95% Prox LAD, 40% mid-LAD, and 10% dist-LAD stenosis. Staged PCI with DES to Prox-LAD.   [name] Chronic combined systolic and diastolic CHF (congestive heart failure) ([place]) 2011   echo 2/18: EF 55-60, normal wall motion, grade 2 diastolic dysfunction, trivial AI // echo 3/18: Septal and apical HK, EF 45-50, normal wall motion, trivial AI, mild LAE, PASP 38 // echo 8/18: EF 60-65, normal wall motion, grade 1  diastolic dysfunction, calcified aortic valve leaflets, mild aortic stenosis (mean 13, peak 28, LVOT/AV mean velocity 0.51), mild AI, moderate MAC, mild LAE, trivial TR   . Chronic kidney disease    STAGE 4  . Chronic kidney disease on chronic dialysis (HCC)    t, th, sat  . Complication of anesthesia   . Depression   . Diabetes mellitus Dx 1989  . Elevated lipids   . GERD (gastroesophageal reflux disease)   . Gout   . Heart murmur    asymptomatic  . Hepatitis C Dx 2013  . Hypertension Dx 1989  . Infected surgical wound    Lt arm  . Myocardial infarction ([place]) 07/2015  . Obesity   . Pancreatitis 2013  . Pneumonia   . Refusal of blood transfusions as patient is Jehovah's Witness    pt states she is not [place] witness and does not refuse blood products  . Tendinitis   . Tremors of nervous system    LEFT HAND  . Ulcer 2010    Past Surgical History:  Procedure Laterality Date  . A/V FISTULAGRAM Left 04/11/2019   Procedure: A/V FISTULAGRAM;  Surgeon: Katha Cabal, MD;  Location: [place] [facility];  Service: Cardiovascular;  Laterality: Left;  . A/V FISTULAGRAM Left 06/02/2019   Procedure: A/V FISTULAGRAM;  Surgeon: Katha Cabal, MD;  Location: [place] [facility];  Service: Cardiovascular;  Laterality: Left;  . APPLICATION OF WOUND VAC Left 08/14/2017   Procedure: APPLICATION OF WOUND VAC Exchange;  Surgeon: Robert Bellow, MD;  Location: [facility];  Service: General;  Laterality: Left;  . APPLICATION OF WOUND VAC Left 12/21/2018   Procedure: APPLICATION OF WOUND VAC;  Surgeon: Katha Cabal, MD;  Location: [facility];  Service: Vascular;  Laterality: Left;  . AV FISTULA PLACEMENT Left 08/19/2018   Procedure: ARTERIOVENOUS (AV) FISTULA CREATION ( BRACHIOBASILIC );  Surgeon: Katha Cabal, MD;  Location: [facility];  Service: Vascular;  Laterality: Left;  . BASCILIC VEIN TRANSPOSITION Left 11/18/2018   Procedure: BASCILIC VEIN TRANSPOSITION;  Surgeon: Katha Cabal, MD;  Location: [facility];  Service: Vascular;  Laterality: Left;  . BIOPSY  09/20/2020   Procedure: BIOPSY;  Surgeon: Carol Ada, MD;  Location: [place];  Service: Endoscopy;;  . CHOLECYSTECTOMY    . COLONOSCOPY WITH PROPOFOL N/A 02/03/2018   Procedure: COLONOSCOPY WITH PROPOFOL;  Surgeon: Lin Landsman, MD;  Location: [facility];  Service: Gastroenterology;  Laterality: N/A;  . CORONARY ANGIOPLASTY  07/2015   STENT  . CORONARY STENT INTERVENTION N/A 09/18/2016   Procedure: Coronary Stent Intervention;  Surgeon: [place] Sine, MD;  Location: [place] [facility];  Service: Cardiovascular;  Laterality: N/A;  . DIALYSIS/PERMA CATHETER INSERTION N/A 05/10/2018   Procedure: DIALYSIS/PERMA CATHETER INSERTION;  Surgeon: Katha Cabal, MD;  Location: [place] [facility];  Service: Cardiovascular;  Laterality: N/A;  . DRESSING CHANGE UNDER ANESTHESIA Left 08/15/2017   Procedure: exploration of wound for bleeding;  Surgeon: Robert Bellow, MD;  Location: [facility];  Service: General;  Laterality: Left;  . ESOPHAGOGASTRODUODENOSCOPY (EGD) WITH PROPOFOL N/A 02/03/2018   Procedure: ESOPHAGOGASTRODUODENOSCOPY (EGD) WITH PROPOFOL;  Surgeon: Lin Landsman, MD;  Location: [facility];  Service: Gastroenterology;  Laterality: N/A;  . ESOPHAGOGASTRODUODENOSCOPY (EGD) WITH PROPOFOL N/A 09/20/2020   Procedure: ESOPHAGOGASTRODUODENOSCOPY (EGD) WITH PROPOFOL;  Surgeon: Carol Ada, MD;  Location: [place];  Service: Endoscopy;  Laterality: N/A;  . EYE SURGERY  11/17/2018  . INCISION AND DRAINAGE ABSCESS Left 08/12/2017   Procedure: INCISION AND DRAINAGE ABSCESS;  Surgeon: Robert Bellow, MD;  Location: [facility];  Service: General;  Laterality: Left;  . KNEE ARTHROSCOPY    . LEFT HEART CATH N/A 09/18/2016   Procedure: Left Heart Cath;  Surgeon: [place] Sine, MD;  Location: [place] [facility];  Service: Cardiovascular;  Laterality: N/A;  . LEFT HEART CATH AND CORONARY  ANGIOGRAPHY N/A 09/16/2016   Procedure: Left Heart Cath and Coronary Angiography;  Surgeon: Burnell Blanks, MD;  Location: [place] [facility];  Service: Cardiovascular;  Laterality: N/A;  . LEFT HEART CATH AND CORONARY ANGIOGRAPHY N/A 04/29/2017   Procedure: LEFT HEART CATH AND CORONARY ANGIOGRAPHY;  Surgeon: Nelva Bush, MD;  Location: [place] [facility];  Service: Cardiovascular;  Laterality: N/A;  . LOWER EXTREMITY ANGIOGRAPHY Right 03/08/2018   Procedure: LOWER EXTREMITY ANGIOGRAPHY;  Surgeon: Katha Cabal, MD;  Location: [place] [facility];  Service: Cardiovascular;  Laterality: Right;  . TUBAL LIGATION    . TUBAL LIGATION    . UPPER EXTREMITY ANGIOGRAPHY Right 09/19/2019   Procedure: UPPER EXTREMITY ANGIOGRAPHY;  Surgeon: Katha Cabal, MD;  Location: [place] [facility];  Service: Cardiovascular;  Laterality: Right;  . WOUND DEBRIDEMENT Left 12/21/2018   Procedure: DEBRIDEMENT WOUND;  Surgeon: Katha Cabal, MD;  Location: [facility];  Service: Vascular;  Laterality: Left;  .  WOUND DEBRIDEMENT Left 12/30/2018   Procedure: DEBRIDEMENT WOUND WITH VAC PLACEMENT (LEFT UPPER EXTREMITY);  Surgeon: Katha Cabal, MD;  Location: [facility];  Service: Vascular;  Laterality: Left;    Current Medications: No outpatient medications have been marked as taking for the 10/07/20 encounter (Appointment) with Sherren Mocha, MD.     Allergies:   Morphine, Shellfish allergy, and Diazepam   Social History   Socioeconomic History  . Marital status: Divorced    Spouse name: Not on file  . Number of children: 3  . Years of education: Not on file  . Highest education level: Bachelor's degree (e.g., BA, AB, BS)  Occupational History  . Occupation: disability  Tobacco Use  . Smoking status: Former Smoker    Packs/day: 0.25    Years: 6.00    Pack years: 1.50    Types: Cigarettes    Quit date: 10/25/1980    Years since quitting: 39.9  . Smokeless tobacco: Never Used   Vaping Use  . Vaping Use: Never used  Substance and Sexual Activity  . Alcohol use: Yes    Comment: rare  . Drug use: Not Currently    Types: Marijuana  . Sexual activity: Not on file    Comment: Not asked  Other Topics Concern  . Not on file  Social History Narrative  . Not on file   Social Determinants of Health   Financial Resource Strain: Low Risk   . Difficulty of Paying Living Expenses: Not hard at all  Food Insecurity: No Food Insecurity  . Worried About Charity fundraiser in the Last Year: Never true  . Ran Out of Food in the Last Year: Never true  Transportation Needs: No Transportation Needs  . Lack of Transportation (Medical): No  . Lack of Transportation (Non-Medical): No  Physical Activity: Inactive  . Days of Exercise per Week: 0 days  . Minutes of Exercise per Session: 0 min  Stress: Stress Concern Present  . Feeling of Stress : To some extent  Social Connections: Socially Isolated  . Frequency of Communication with Friends and Family: Twice a week  . Frequency of Social Gatherings with Friends and Family: Never  . Attends Religious Services: 1 to 4 times per year  . Active Member of Clubs or Organizations: No  . Attends Archivist Meetings: Never  . Marital Status: Divorced     Family History: The patient's ***family history includes Colon cancer in her mother; Diabetes in her paternal grandmother; Heart attack in her maternal grandmother and another family member; Hypertension in her brother and sister. There is no history of Breast cancer.  ROS:   Please see the history of present illness.    *** All other systems reviewed and are negative.  EKGs/Labs/Other Studies Reviewed:    The following studies were reviewed today: ***  EKG:  EKG is *** ordered today.  The ekg ordered today demonstrates ***  Recent Labs: 01/31/2020: TSH 2.096 09/24/2020: B Natriuretic Peptide 48.6; Magnesium 1.8 09/27/2020: ALT 36; BUN 9; Creatinine, Ser 4.01;  Hemoglobin 9.7; Platelets 209; Potassium 3.6; Sodium 139  Recent Lipid Panel    Component Value Date/Time   CHOL 122 09/13/2016 1245   TRIG 168 (H) 09/13/2016 1245   HDL 36 (L) 09/13/2016 1245   CHOLHDL 3.4 09/13/2016 1245   VLDL 34 09/13/2016 1245   LDLCALC 52 09/13/2016 1245     Risk Assessment/Calculations:   {Does this patient have ATRIAL FIBRILLATION?:[phone]}   Physical Exam:  VS:  There were no vitals taken for this visit.    Wt Readings from Last 3 Encounters:  09/27/20 275 lb 8 oz (125 kg)  09/23/20 277 lb 12.5 oz (126 kg)  09/14/20 277 lb 12.5 oz (126 kg)     GEN: *** Well nourished, well developed in no acute distress HEENT: Normal NECK: No JVD; No carotid bruits LYMPHATICS: No lymphadenopathy CARDIAC: ***RRR, no murmurs, rubs, gallops RESPIRATORY:  Clear to auscultation without rales, wheezing or rhonchi  ABDOMEN: Soft, non-tender, non-distended MUSCULOSKELETAL:  No edema; No deformity  SKIN: Warm and dry NEUROLOGIC:  Alert and oriented x 3 PSYCHIATRIC:  Normal affect   ASSESSMENT:    No diagnosis found. PLAN:    In order of problems listed above:  1. ***   {Are you ordering a CV Procedure (e.g. stress test, cath, DCCV, TEE, etc)?   Press F2        :[id number]}    Medication Adjustments/Labs and Tests Ordered: Current medicines are reviewed at length with the patient today.  Concerns regarding medicines are outlined above.  No orders of the defined types were placed in this encounter.  No orders of the defined types were placed in this encounter.   There are no Patient Instructions on file for this visit.   Signed, Sherren Mocha, MD  10/07/2020 6:15 AM    [place]

## 2020-10-07 NOTE — Telephone Encounter (Signed)
Requested medication (s) are due for refill today: Yes  Requested medication (s) are on the active medication list: Yes  Last refill:  09/27/20  Future visit scheduled: Yes  Notes to clinic:  Send to another pharmacy     Requested Prescriptions  Pending Prescriptions Disp Refills   Vitamin D, Ergocalciferol, (DRISDOL) 1.25 MG (50000 UNIT) CAPS capsule [Pharmacy Med Name: VIT D2 ERGOCAL (50,000 UNIT)] 1 capsule 11    Sig: TAKE [place] A MONTH      Endocrinology:  Vitamins - Vitamin D Supplementation Failed - 10/07/2020  5:05 PM      Failed - 50,000 IU strengths are not delegated      Failed - Ca in normal range and within 360 days    Calcium  Date Value Ref Range Status  09/27/2020 8.4 (L) 8.7 - 10.3 mg/dL Final   Calcium, Ion  Date Value Ref Range Status  09/13/2020 0.99 (L) 1.15 - 1.40 mmol/L Final          Failed - Vitamin D in normal range and within 360 days    No results found for: [id number], [id number], [id number], 25OHVITD3, 25OHVITD2, 25OHVITD3, 25OHVITD2, 25OHVITD1, 25OHVITD2, 25OHVITD3, VD25OH        Passed - Phosphate in normal range and within 360 days    Phosphorus  Date Value Ref Range Status  09/22/2020 3.8 2.5 - 4.6 mg/dL Final          Passed - Valid encounter within last 12 months    Recent Outpatient Visits           1 week ago Diabetic hyperosmolar non-ketotic state [facility])   [facility] [place], [place], [place]   3 months ago Pap smear abnormality of cervix/human papillomavirus (HPV) positive   [place], [place], [place]   4 months ago Esophageal dysphagia   [place], [place], [place]   6 months ago Mouth pain   [facility] [name], [place]   8 months ago [place], [place], [place]       Future Appointments             In 2 months Fisher, Kirstie Peri, MD [facility], [facility]               Signed Prescriptions Disp Refills   torsemide (DEMADEX) 20 MG tablet 180 tablet 1    Sig: TAKE (2) TABLETS BY MOUTH TWICE DAILY.      Cardiovascular:  Diuretics - Loop Failed - 10/07/2020  5:05 PM      Failed - Ca in normal range and within 360 days    Calcium  Date Value Ref Range Status  09/27/2020 8.4 (L) 8.7 - 10.3 mg/dL Final   Calcium, Ion  Date Value Ref Range Status  09/13/2020 0.99 (L) 1.15 - 1.40 mmol/L Final          Failed - Cr in normal range and within 360 days    Creat  Date Value Ref Range Status  09/23/2016 1.36 (H) 0.50 - 1.05 mg/dL Final    Comment:      For patients > or = 63 years of age: The upper reference limit for Creatinine is approximately 13% higher for people identified as African-American.      Creatinine, Ser  Date Value Ref Range Status  09/27/2020 4.01 (H) 0.57 - 1.00 mg/dL Final    Comment:    **  Verified by repeat analysis**   Creatinine, Urine  Date Value Ref Range Status  03/19/2017 94.44 mg/dL Final  03/19/2017 95.79 mg/dL Final          Failed - Last BP in normal range    BP Readings from Last 1 Encounters:  09/27/20 (!) 168/69          Passed - K in normal range and within 360 days    Potassium  Date Value Ref Range Status  09/27/2020 3.6 3.5 - 5.2 mmol/L Final   Potassium [facility] vascular lab)  Date Value Ref Range Status  09/19/2019 3.5 3.5 - 5.1 Final    Comment:    Performed at [facility], [address], [place], [place] [address]          Passed - Na in normal range and within 360 days    Sodium  Date Value Ref Range Status  09/27/2020 139 134 - 144 mmol/L Final          Passed - Valid encounter within last 6 months    Recent Outpatient Visits           1 week ago Diabetic hyperosmolar non-ketotic state [facility])   [place], [place]   3 months ago Pap smear abnormality of cervix/human papillomavirus (HPV) positive   [place], [place]   4 months ago Esophageal dysphagia   [place], [place], [place]   6 months ago Mouth pain   [facility] [place], [place], [place]   8 months ago [place], [place], [place]       Future Appointments             In 2 months Fisher, Kirstie Peri, MD [facility], [place]

## 2020-10-17 ENCOUNTER — Telehealth: Payer: Self-pay | Admitting: *Deleted

## 2020-10-17 NOTE — Chronic Care Management (AMB) (Signed)
  Care Management   Note  10/17/2020 Name: [name] MRN: [id number] DOB: [birth year]  [name] is a 65 y.o. year old female who is a primary care patient of Rubye Beach and is actively engaged with the care management team. I reached out to [name] by phone today to assist with re-scheduling an initial visit with the Pharmacist  Follow up plan: Unsuccessful telephone outreach attempt made. The care management team will reach out to the patient again over the next 7 days.  If patient returns call to provider office, please advise to call Tay[place] Lysle Morales at [facility]

## 2020-10-23 NOTE — Chronic Care Management (AMB) (Signed)
  Care Management   Note  10/23/2020 Name: [name] MRN: [id number] DOB: June 29, 1956  [name] is a 64 y.o. year old female who is a primary care patient of Rubye Beach and is actively engaged with the care management team. I reached out to [name] by phone today to assist with re-scheduling a follow up visit with the Pharmacist  Follow up plan: A second unsuccessful telephone outreach attempt made. A HIPAA compliant phone message was left for the patient providing contact information and requesting a return call.  The care management team will reach out to the patient again over the next 7 days.  If patient returns call to provider office, please advise to call [place] Lysle Morales at [facility]

## 2020-10-24 ENCOUNTER — Emergency Department (HOSPITAL_COMMUNITY)
Admission: EM | Admit: 2020-10-24 | Discharge: 2020-10-24 | Disposition: A | Discharge status: Home / Self Care | Payer: Medicare Other | Attending: Emergency Medicine | Admitting: Emergency Medicine

## 2020-10-24 ENCOUNTER — Other Ambulatory Visit: Payer: Self-pay

## 2020-10-24 ENCOUNTER — Encounter (HOSPITAL_COMMUNITY): Payer: Self-pay

## 2020-10-24 DIAGNOSIS — Z79899 Other long term (current) drug therapy: Secondary | ICD-10-CM | POA: Diagnosis not present

## 2020-10-24 DIAGNOSIS — J449 Chronic obstructive pulmonary disease, unspecified: Secondary | ICD-10-CM | POA: Diagnosis not present

## 2020-10-24 DIAGNOSIS — S2020XA Contusion of thorax, unspecified, initial encounter: Secondary | ICD-10-CM | POA: Insufficient documentation

## 2020-10-24 DIAGNOSIS — I132 Hypertensive heart and chronic kidney disease with heart failure and with stage 5 chronic kidney disease, or end stage renal disease: Secondary | ICD-10-CM | POA: Diagnosis not present

## 2020-10-24 DIAGNOSIS — N186 End stage renal disease: Secondary | ICD-10-CM | POA: Insufficient documentation

## 2020-10-24 DIAGNOSIS — Z87891 Personal history of nicotine dependence: Secondary | ICD-10-CM | POA: Insufficient documentation

## 2020-10-24 DIAGNOSIS — I2510 Atherosclerotic heart disease of native coronary artery without angina pectoris: Secondary | ICD-10-CM | POA: Diagnosis not present

## 2020-10-24 DIAGNOSIS — Z7982 Long term (current) use of aspirin: Secondary | ICD-10-CM | POA: Insufficient documentation

## 2020-10-24 DIAGNOSIS — S3992XA Unspecified injury of lower back, initial encounter: Secondary | ICD-10-CM | POA: Diagnosis present

## 2020-10-24 DIAGNOSIS — E1122 Type 2 diabetes mellitus with diabetic chronic kidney disease: Secondary | ICD-10-CM | POA: Insufficient documentation

## 2020-10-24 DIAGNOSIS — W19XXXA Unspecified fall, initial encounter: Secondary | ICD-10-CM | POA: Insufficient documentation

## 2020-10-24 DIAGNOSIS — S39012A Strain of muscle, fascia and tendon of lower back, initial encounter: Secondary | ICD-10-CM | POA: Diagnosis not present

## 2020-10-24 DIAGNOSIS — D631 Anemia in chronic kidney disease: Secondary | ICD-10-CM | POA: Insufficient documentation

## 2020-10-24 DIAGNOSIS — Z794 Long term (current) use of insulin: Secondary | ICD-10-CM | POA: Insufficient documentation

## 2020-10-24 DIAGNOSIS — Z992 Dependence on renal dialysis: Secondary | ICD-10-CM | POA: Diagnosis not present

## 2020-10-24 DIAGNOSIS — J45909 Unspecified asthma, uncomplicated: Secondary | ICD-10-CM | POA: Insufficient documentation

## 2020-10-24 DIAGNOSIS — I5042 Chronic combined systolic (congestive) and diastolic (congestive) heart failure: Secondary | ICD-10-CM | POA: Insufficient documentation

## 2020-10-24 MED ORDER — DICLOFENAC SODIUM 1 % EX GEL: 2.0000 g | Freq: Four times a day (QID) | CUTANEOUS | 0 refills | Status: DC

## 2020-10-24 MED ORDER — CYCLOBENZAPRINE HCL 10 MG PO TABS: 10.0000 mg | ORAL_TABLET | Freq: Two times a day (BID) | ORAL | 0 refills | Status: DC | PRN

## 2020-10-24 MED ORDER — MORPHINE SULFATE (PF) 4 MG/ML IV SOLN
4.0000 mg | Freq: Once | INTRAVENOUS | Status: DC
  Filled 2020-10-24: qty 1

## 2020-10-24 MED ORDER — MORPHINE SULFATE (PF) 4 MG/ML IV SOLN
4.0000 mg | Freq: Once | INTRAVENOUS | Status: AC
Start: 2020-10-24 — End: 2020-10-24
  Administered 2020-10-24: 4 mg via INTRAMUSCULAR

## 2020-10-24 MED ORDER — DIPHENHYDRAMINE HCL 25 MG PO CAPS
25.0000 mg | ORAL_CAPSULE | Freq: Once | ORAL | Status: AC
  Administered 2020-10-24: 25 mg via ORAL
  Filled 2020-10-24: qty 1

## 2020-10-24 NOTE — ED Triage Notes (Signed)
Pt arrived via POV, s/p mechanical fall on sat, was seen at [facility] ED  4/12, and cleared. Sent home with rx for percocet. States pain worsening, medication not helping.

## 2020-10-24 NOTE — Discharge Instructions (Addendum)
I reviewed your x-Recker and CT imaging obtained at the ED on 10/22/2020 which was without any fractures or other concerning findings.  Given your physical exam here today, suspect that you are having muscular discomfort from your fall 5 days ago.  I have prescribed topical Voltaren gel which I would like for you to apply to the affected area.  Continue with Tylenol as needed.  I have also prescribed you a short course of muscle relaxants.  You were given a prescription for Flexeril which is a muscle relaxer.  You should not drive, work, consume alcohol, or operate machinery while taking this medication as it can make you very drowsy.  Do not combine with Percocet as that will exacerbate your drowsiness.  Please call the office of Dr. Erlinda Hong, local orthopedist.  While these injuries can result in pain for several weeks, I would like for you to follow-up with their for ongoing evaluation and management.  Furthermore, your primary care provider has been trying to reach you.  Given your recent hospitalizations, it is of the utmost importance that you follow-up with soon as possible.  Return to the ED or seek immediate medical attention should you experience any new or worsening symptoms.

## 2020-10-24 NOTE — ED Provider Notes (Signed)
[facility] DEPT Provider Note   CSN: [id number] Arrival date & time: 10/24/20  3329     History Chief Complaint  Patient presents with  . Back Pain    [name] is a 65 y.o. female with past medical history significant for poorly controlled IDDM, ESRD on HD, necrotizing fasciitis 06/2020 s/p multiple debridements, and recent hospitalization 09/2020 for weakness in setting of GI bleed who presents to the ED with complaints of back pain.  Patient reports that on 10/19/2020 she was sitting in a plastic white lawn chair when the legs gave out from underneath her and she fell backward, landing on her back.  She denies any immediate pain symptoms.  However, the next morning she noticed left-sided lumbar region discomfort which has been getting progressively worse.  I reviewed her medical record and she was seen at [facility] ED in [place] and diagnosed with contusion of her rib.  Extensive imaging was obtained including lumbar plain films and CT of her chest, abdomen, and pelvis which were all without any acute findings.    She states that her left-sided lumbar region discomfort is radiating towards her thoracic region.  She describes it as worse with movement and taking deep inspiration.  She states that she had seen physical therapy for her multiple debridements subsequent to necrotizing fasciitis.  She reports that she just had her last PT session a couple of weeks ago.  She has been hospitalized on multiple occasions in the past few months, but has not yet followed up with her primary care provider at [place].  I informed her that they have been trying to reach out to her and she tells me that she recently changed her phone number.  Patient was discharged home from the [facility] ED with a prescription for Percocet which she has been taking, without any significant relief.  She denies any urinary symptoms, fevers or chills, cough, difficulty breathing, abdominal  pain, nausea or vomiting, other injuries, or other symptoms.  HPI     Past Medical History:  Diagnosis Date  . Anemia   . Aortic stenosis    Echo 8/18: mean 13, peak 28, LVOT/AV mean velocity 0.51  . Arthritis   . Asthma    As a child   . Bronchitis   . CAD (coronary artery disease)    a. 09/2016: 50% Ost 1st Mrg stenosis, 50% 2nd Mrg stenosis, 20% Mid-Cx, 95% Prox LAD, 40% mid-LAD, and 10% dist-LAD stenosis. Staged PCI with DES to Prox-LAD.   [name] Chronic combined systolic and diastolic CHF (congestive heart failure) ([place]) 2011   echo 2/18: EF 55-60, normal wall motion, grade 2 diastolic dysfunction, trivial AI // echo 3/18: Septal and apical HK, EF 45-50, normal wall motion, trivial AI, mild LAE, PASP 38 // echo 8/18: EF 60-65, normal wall motion, grade 1 diastolic dysfunction, calcified aortic valve leaflets, mild aortic stenosis (mean 13, peak 28, LVOT/AV mean velocity 0.51), mild AI, moderate MAC, mild LAE, trivial TR   . Chronic kidney disease    STAGE 4  . Chronic kidney disease on chronic dialysis (HCC)    t, th, sat  . Complication of anesthesia   . Depression   . Diabetes mellitus Dx 1989  . Elevated lipids   . GERD (gastroesophageal reflux disease)   . Gout   . Heart murmur    asymptomatic  . Hepatitis C Dx 2013  . Hypertension Dx 1989  . Infected surgical wound    Lt arm  .  Myocardial infarction ([place]) 07/2015  . Obesity   . Pancreatitis 2013  . Pneumonia   . Refusal of blood transfusions as patient is Jehovah's Witness    pt states she is not [place] witness and does not refuse blood products  . Tendinitis   . Tremors of nervous system    LEFT HAND  . Ulcer 2010    Patient Active Problem List   Diagnosis Date Noted  . Upper GI bleed 09/17/2020  . Prolonged QT interval 09/17/2020  . COPD (chronic obstructive pulmonary disease) ([place]) 09/17/2020  . Acute pulmonary edema ([place]) 09/14/2020  . Acute respiratory failure with hypoxia ([place]) 09/13/2020  . Allergy,  unspecified, initial encounter 03/29/2020  . Anaphylactic shock, unspecified, initial encounter 03/29/2020  . Volume overload 01/30/2020  . Pain in left upper arm 01/18/2020  . Cellulitis 01/16/2020  . Anemia due to end stage renal disease ([place]) 01/16/2020  . Diabetic polyneuropathy associated with type 2 diabetes mellitus ([place]) 11/06/2019  . Nystagmus 11/06/2019  . Intermittent claudication ([place]) 11/06/2019  . Vertigo of central origin 11/06/2019  . Weakness   . Hypervolemia associated with renal insufficiency 10/31/2019  . BMI 45.0-49.9, adult ([place]) 10/18/2019  . Ascending aortic aneurysm ([place]) 03/27/2019  . Puncture wound without foreign body of left forearm, initial encounter 01/02/2019  . Non-healing wound of upper extremity 12/28/2018  . Unspecified open wound of left upper arm, initial encounter 12/06/2018  . Complication from renal dialysis device 10/20/2018  . Iron deficiency anemia, unspecified 10/18/2018  . ESRD (end stage renal disease) on dialysis ([place]) 06/01/2018  . ARF (acute renal failure) ([place]) 05/09/2018  . Colon cancer screening   . LGSIL on Pap smear of cervix 01/27/2018  . Gout 12/25/2017  . AKI (acute kidney injury) ([place]) 12/24/2017  . Hyperglycemia 12/24/2017  . Chronic diastolic heart failure ([place]) 09/30/2017  . Lymphedema 09/30/2017  . Aortic stenosis   . Acute pain of both knees 02/12/2017  . Chronic gout due to renal impairment of multiple sites without tophus 02/12/2017  . Esophageal dysphagia 02/12/2017  . Osteoarthritis 01/22/2017  . Diabetic hyperosmolar non-ketotic state ([place]) 12/13/2016  . CAD (coronary artery disease) 12/13/2016  . Hyperlipidemia 12/13/2016  . Hyperphosphatemia 12/13/2016  . Acute diastolic CHF (congestive heart failure) ([place])   . Hyperkalemia 08/21/2016  . Asthma 11/29/2015  . Depression 09/05/2015  . GERD (gastroesophageal reflux disease) 03/25/2015  . Environmental allergies 03/14/2015  . SOB (shortness of breath)  02/15/2015  . Chronic hepatitis C without hepatic coma ([place]) 01/31/2015  . Diabetic neuropathy ([place]) 01/31/2015  . OSA (obstructive sleep apnea) 01/01/2015  . Poor dentition 11/13/2014  . Essential hypertension 10/08/2014  . Morbid (severe) obesity due to excess calories ([place]) 10/08/2014  . Localized, primary osteoarthritis 01/25/2014  . Family history of diabetes mellitus 03/31/2013  . Pain in limb 03/31/2013  . Uncontrolled type 2 diabetes mellitus with hyperosmolar nonketotic hyperglycemia ([place]) 03/25/2012    Class: Chronic    Past Surgical History:  Procedure Laterality Date  . A/V FISTULAGRAM Left 04/11/2019   Procedure: A/V FISTULAGRAM;  Surgeon: Katha Cabal, MD;  Location: [facility];  Service: Cardiovascular;  Laterality: Left;  . A/V FISTULAGRAM Left 06/02/2019   Procedure: A/V FISTULAGRAM;  Surgeon: Katha Cabal, MD;  Location: [facility];  Service: Cardiovascular;  Laterality: Left;  . APPLICATION OF WOUND VAC Left 08/14/2017   Procedure: APPLICATION OF WOUND VAC Exchange;  Surgeon: Robert Bellow, MD;  Location: [facility];  Service: General;  Laterality: Left;  . APPLICATION OF WOUND VAC Left 12/21/2018   Procedure: APPLICATION OF WOUND VAC;  Surgeon: Katha Cabal, MD;  Location: [facility];  Service: Vascular;  Laterality: Left;  . AV FISTULA PLACEMENT Left 08/19/2018   Procedure: ARTERIOVENOUS (AV) FISTULA CREATION ( BRACHIOBASILIC );  Surgeon: Katha Cabal, MD;  Location: [facility];  Service: Vascular;  Laterality: Left;  . BASCILIC VEIN TRANSPOSITION Left 11/18/2018   Procedure: BASCILIC VEIN TRANSPOSITION;  Surgeon: Katha Cabal, MD;  Location: [facility];  Service: Vascular;  Laterality: Left;  . BIOPSY  09/20/2020   Procedure: BIOPSY;  Surgeon: Carol Ada, MD;  Location: [facility];  Service: Endoscopy;;  . CHOLECYSTECTOMY    . COLONOSCOPY WITH PROPOFOL N/A 02/03/2018   Procedure: COLONOSCOPY WITH PROPOFOL;  Surgeon:  Lin Landsman, MD;  Location: [facility];  Service: Gastroenterology;  Laterality: N/A;  . CORONARY ANGIOPLASTY  07/2015   STENT  . CORONARY STENT INTERVENTION N/A 09/18/2016   Procedure: Coronary Stent Intervention;  Surgeon: Troy Sine, MD;  Location: [facility];  Service: Cardiovascular;  Laterality: N/A;  . DIALYSIS/PERMA CATHETER INSERTION N/A 05/10/2018   Procedure: DIALYSIS/PERMA CATHETER INSERTION;  Surgeon: Katha Cabal, MD;  Location: [facility];  Service: Cardiovascular;  Laterality: N/A;  . DRESSING CHANGE UNDER ANESTHESIA Left 08/15/2017   Procedure: exploration of wound for bleeding;  Surgeon: Robert Bellow, MD;  Location: [facility];  Service: General;  Laterality: Left;  . ESOPHAGOGASTRODUODENOSCOPY (EGD) WITH PROPOFOL N/A 02/03/2018   Procedure: ESOPHAGOGASTRODUODENOSCOPY (EGD) WITH PROPOFOL;  Surgeon: Lin Landsman, MD;  Location: [facility];  Service: Gastroenterology;  Laterality: N/A;  . ESOPHAGOGASTRODUODENOSCOPY (EGD) WITH PROPOFOL N/A 09/20/2020   Procedure: ESOPHAGOGASTRODUODENOSCOPY (EGD) WITH PROPOFOL;  Surgeon: Carol Ada, MD;  Location: [facility];  Service: Endoscopy;  Laterality: N/A;  . EYE SURGERY  11/17/2018  . INCISION AND DRAINAGE ABSCESS Left 08/12/2017   Procedure: INCISION AND DRAINAGE ABSCESS;  Surgeon: Robert Bellow, MD;  Location: [facility];  Service: General;  Laterality: Left;  . KNEE ARTHROSCOPY    . LEFT HEART CATH N/A 09/18/2016   Procedure: Left Heart Cath;  Surgeon: Troy Sine, MD;  Location: [facility];  Service: Cardiovascular;  Laterality: N/A;  . LEFT HEART CATH AND CORONARY ANGIOGRAPHY N/A 09/16/2016   Procedure: Left Heart Cath and Coronary Angiography;  Surgeon: Burnell Blanks, MD;  Location: [facility];  Service: Cardiovascular;  Laterality: N/A;  . LEFT HEART CATH AND CORONARY ANGIOGRAPHY N/A 04/29/2017   Procedure: LEFT HEART CATH AND CORONARY ANGIOGRAPHY;   Surgeon: Nelva Bush, MD;  Location: [facility];  Service: Cardiovascular;  Laterality: N/A;  . LOWER EXTREMITY ANGIOGRAPHY Right 03/08/2018   Procedure: LOWER EXTREMITY ANGIOGRAPHY;  Surgeon: Katha Cabal, MD;  Location: [facility];  Service: Cardiovascular;  Laterality: Right;  . TUBAL LIGATION    . TUBAL LIGATION    . UPPER EXTREMITY ANGIOGRAPHY Right 09/19/2019   Procedure: UPPER EXTREMITY ANGIOGRAPHY;  Surgeon: Katha Cabal, MD;  Location: [facility];  Service: Cardiovascular;  Laterality: Right;  . WOUND DEBRIDEMENT Left 12/21/2018   Procedure: DEBRIDEMENT WOUND;  Surgeon: Katha Cabal, MD;  Location: [facility];  Service: Vascular;  Laterality: Left;  . WOUND DEBRIDEMENT Left 12/30/2018   Procedure: DEBRIDEMENT WOUND WITH VAC PLACEMENT (LEFT UPPER EXTREMITY);  Surgeon: Katha Cabal, MD;  Location: [facility];  Service: Vascular;  Laterality: Left;  OB History    Gravida  4   Para  3   Term  3   Preterm      AB  1   Living  3     SAB  1   IAB      Ectopic      Multiple      Live Births  3           Family History  Problem Relation Age of Onset  . Colon cancer Mother   . Heart attack Other   . Heart attack Maternal Grandmother   . Hypertension Sister   . Hypertension Brother   . Diabetes Paternal Grandmother   . Breast cancer Neg Hx     Social History   Tobacco Use  . Smoking status: Former Smoker    Packs/day: 0.25    Years: 6.00    Pack years: 1.50    Types: Cigarettes    Quit date: 10/25/1980    Years since quitting: 40.0  . Smokeless tobacco: Never Used  Vaping Use  . Vaping Use: Never used  Substance Use Topics  . Alcohol use: Yes    Comment: rare  . Drug use: Not Currently    Types: Marijuana    Home Medications Prior to Admission medications   Medication Sig Start Date End Date Taking? Authorizing Provider  cyclobenzaprine (FLEXERIL) 10 MG tablet Take 1 tablet (10 mg total) by  mouth 2 (two) times daily as needed for muscle spasms. 10/24/20  Yes Corena Herter, PA-C  diclofenac Sodium (VOLTAREN) 1 % GEL Apply 2 g topically 4 (four) times daily. 10/24/20  Yes Corena Herter, PA-C  albuterol (VENTOLIN HFA) 108 (90 Base) MCG/ACT inhaler Inhale 2 puffs into the lungs every 4 (four) hours as needed for wheezing or shortness of breath. 06/28/20   [name], PA-C  allopurinol (ZYLOPRIM) 100 MG tablet Take 1 tablet (100 mg total) by mouth daily. 10/08/20   Thurnell Lose, MD  aspirin 81 MG EC tablet Take 1 tablet (81 mg total) by mouth daily. 09/20/20   Thurnell Lose, MD  atorvastatin (LIPITOR) 80 MG tablet TAKE 1 TABLET BY MOUTH ONCE DAILY AT 6PM Patient taking differently: Take 80 mg by mouth every evening. 07/08/20   [name], PA-C  AURYXIA 1 GM 210 MG(Fe) tablet Take 2 tablets (420 mg total) by mouth 3 (three) times daily with meals. 05/03/20   [name], PA-C  B Complex-C-Folic Acid (RENA-VITE RX PO) Take 1 mg by mouth daily.    [provider]  B Complex-C-Folic Acid (RENA-VITE RX) 1 MG TABS Take 1 mg by mouth daily. 10/15/20   [provider]  buPROPion (WELLBUTRIN SR) 150 MG 12 hr tablet Take 1 tablet (150 mg total) by mouth 2 (two) times daily. 05/03/20   [name], PA-C  carvedilol (COREG) 25 MG tablet TAKE (1) TABLET BY MOUTH TWICE A DAY WITH MEALS (BREAKFAST AND SUPPER) Patient taking differently: Take 25 mg by mouth 2 (two) times daily with a meal. 07/15/20   Burnette, Clearnce Sorrel, PA-C  Cinacalcet HCl (SENSIPAR PO) Take 30 mg by mouth every dialysis. 01/04/20 01/02/21  [provider]  colchicine 0.6 MG tablet Take 1 tablet (0.6 mg total) by mouth once a week for 15 days. 09/29/20 10/14/20  Thurnell Lose, MD  Continuous Blood Gluc Receiver (FREESTYLE LIBRE 14 DAY READER) DEVI To check blood sugar ACHS 09/27/20   [name],  PA-C  Continuous Blood Gluc Sensor (FREESTYLE LIBRE 14 DAY  SENSOR) MISC To check blood sugar ACHS; change every 14 days 09/27/20   [name], PA-C  dicyclomine (BENTYL) 20 MG tablet Take 1 tablet (20 mg total) by mouth in the morning and at bedtime. 09/27/20   [name], PA-C  docusate sodium (COLACE) 100 MG capsule Take 1 capsule (100 mg total) by mouth 2 (two) times daily as needed for mild constipation. 09/20/17   Gouru, Illene Silver, MD  doxepin (SINEQUAN) 10 MG capsule TAKE 1 CAPSULE(10 MG) BY MOUTH FOUR TIMES DAILY AS NEEDED FOR ITCHING Patient taking differently: Take 10 mg by mouth 2 (two) times daily. 05/03/20   [name], PA-C  fluticasone (FLONASE) 50 MCG/ACT nasal spray Place 2 sprays into both nostrils daily as needed for allergies or rhinitis. 09/28/17   [name], PA-C  fluticasone furoate-vilanterol (BREO ELLIPTA) 200-25 MCG/INH AEPB Inhale 1 puff into the lungs daily. 08/30/20   [name], PA-C  hydrALAZINE (APRESOLINE) 100 MG tablet Take 100 mg by mouth 3 (three) times daily. 10/07/20   [provider]  hydrOXYzine (ATARAX/VISTARIL) 25 MG tablet Take 1 tablet (25 mg total) by mouth 4 (four) times daily. Patient taking differently: Take 25 mg by mouth in the morning and at bedtime. 05/03/20   [name], PA-C  insulin detemir (LEVEMIR FLEXTOUCH) 100 UNIT/ML FlexPen Inject 60 Units into the skin 2 (two) times daily. 05/24/20   [name], PA-C  insulin glargine (LANTUS) 100 unit/mL SOPN Inject 25 Units into the skin 2 (two) times daily.    [provider]  insulin lispro (HUMALOG KWIKPEN) 100 UNIT/ML KwikPen Inject 25 Units into the skin with breakfast, with lunch, and with evening meal. Patient taking differently: Inject 7 Units into the skin 2 (two) times daily. 05/14/20   [name], PA-C  lidocaine-prilocaine (EMLA) cream Apply 1 application topically as needed. Patient taking differently: Apply 1 application topically as needed (dialysis days).  03/27/19   Schnier, Dolores Lory, MD  loratadine (CLARITIN) 10 MG tablet TAKE 1 TABLET BY MOUTH ONCE DAILY. Patient taking differently: Take 10 mg by mouth daily as needed for allergies. 07/15/20   [name], PA-C  montelukast (SINGULAIR) 10 MG tablet TAKE ONE TABLET BY MOUTH AT BEDTIME. Patient taking differently: Take 10 mg by mouth at bedtime as needed (allergies). 07/15/20   [name], PA-C  nitroGLYCERIN (NITROSTAT) 0.4 MG SL tablet Place 1 tablet (0.4 mg total) under the tongue every 5 (five) minutes x 3 doses as needed for chest pain. 05/03/20   [name], PA-C  Nutritional Supplements (FEEDING SUPPLEMENT, NEPRO CARB STEADY,) LIQD Take 237 mLs by mouth daily. 02/01/20   Loletha Grayer, MD  nystatin cream (MYCOSTATIN) Apply 1 application topically 2 (two) times daily. Patient taking differently: Apply 1 application topically daily as needed for dry skin. 08/10/19   [name], PA-C  omeprazole (PRILOSEC) 20 MG capsule Take 20 mg by mouth daily. 10/07/20   [provider]  oxyCODONE (ROXICODONE) 5 MG immediate release tablet Take 1 tablet (5 mg total) by mouth every 12 (twelve) hours as needed for severe pain. 08/30/20   [name], PA-C  oxyCODONE-acetaminophen (PERCOCET/ROXICET) 5-325 MG tablet Take 1 tablet by mouth 4 (four) times daily as needed for pain. 10/23/20   [provider]  pantoprazole (PROTONIX) 40 MG tablet Take 1 tablet (40 mg total) by mouth 2 (two) times daily. 09/24/20  Thurnell Lose, MD  pregabalin (LYRICA) 150 MG capsule Take 1 capsule (150 mg total) by mouth 2 (two) times daily. 09/27/20   [name], PA-C  SENNA-PLUS 8.6-50 MG tablet TAKE ONE TABLET BY MOUTH AT BEDTIME. 07/15/20   [name], PA-C  sevelamer carbonate (RENVELA) 800 MG tablet Take 800 mg by mouth 3 (three) times daily. 08/21/20 08/21/21  [provider]  sucralfate (CARAFATE) 1 g tablet Take 1 tablet (1 g total) by mouth  2 (two) times daily for 26 days. 09/24/20 10/20/20  Thurnell Lose, MD  torsemide (DEMADEX) 20 MG tablet TAKE (2) TABLETS BY MOUTH TWICE DAILY. 10/07/20   [name], PA-C  triamcinolone cream (KENALOG) 0.1 % APPLY TO AFFECTED AREA TWICE DAILY Patient taking differently: Apply 1 application topically 2 (two) times daily as needed (rash). 06/17/18   [name], PA-C  valACYclovir (VALTREX) 500 MG tablet TAKE (1) TABLET BY MOUTH EVERY OTHER DAY. Patient taking differently: Take 500 mg by mouth every other day. 09/06/20   [name], PA-C  VICTOZA 18 MG/3ML SOPN Start with 0.21m daily and increase by 0.647mper week until max dose of 1.8 mg dose achieved. Patient taking differently: Inject into the skin every Wednesday. 03/25/20   BuMar DaringPA-C  Vitamin D, Ergocalciferol, (DRISDOL) 1.25 MG (50000 UNIT) CAPS capsule TAKE 1 CAPSULE BY MOUTH ONCE A MONTH 09/27/20   BuMar DaringPA-C  gabapentin (NEURONTIN) 100 MG capsule Take 1 capsule (100 mg total) by mouth 3 (three) times daily. 08/19/17 02/03/19  PaFritzi MandesMD    Allergies    Morphine, Shellfish allergy, and Diazepam  Review of Systems   Review of Systems  All other systems reviewed and are negative.   Physical Exam Updated Vital Signs BP (!) 178/72 (BP Location: Right Arm)   Pulse 84   Temp 98 F (36.7 C) (Oral)   Resp 18   SpO2 92%   Physical Exam Vitals and nursing note reviewed. Exam conducted with a chaperone present.  Constitutional:      Appearance: Normal appearance.  HENT:     Head: Normocephalic and atraumatic.  Eyes:     General: No scleral icterus.    Conjunctiva/sclera: Conjunctivae normal.  Cardiovascular:     Rate and Rhythm: Normal rate.     Pulses: Normal pulses.  Pulmonary:     Effort: Pulmonary effort is normal. No respiratory distress.     Breath sounds: No wheezing or rales.     Comments: Left-sided posterior chest wall tenderness, inferiorly. CTA  bilaterally. Chest:     Chest wall: Tenderness present.  Abdominal:     General: Abdomen is flat. There is no distension.     Palpations: Abdomen is soft.     Tenderness: There is no abdominal tenderness.  Musculoskeletal:        General: Tenderness present. No swelling or deformity.     Comments: Tenderness in left lumbar region.  No midline spinal TTP.  Moves all extremities.  No overlying skin changes.  Skin:    General: Skin is dry.  Neurological:     General: No focal deficit present.     Mental Status: She is alert and oriented to person, place, and time.     GCS: GCS eye subscore is 4. GCS verbal subscore is 5. GCS motor subscore is 6.  Psychiatric:        Mood and Affect: Mood normal.  Behavior: Behavior normal.        Thought Content: Thought content normal.     ED Results / Procedures / Treatments   Labs (all labs ordered are listed, but only abnormal results are displayed) Labs Reviewed - No data to display  EKG None  Radiology No results found.  Procedures Procedures   Medications Ordered in ED Medications  diphenhydrAMINE (BENADRYL) capsule 25 mg (25 mg Oral Given 10/24/20 0730)  morphine 4 MG/ML injection 4 mg (4 mg Intramuscular Given 10/24/20 0730)    ED Course  I have reviewed the triage vital signs and the nursing notes.  Pertinent labs & imaging results that were available during my care of the patient were reviewed by me and considered in my medical decision making (see chart for details).    MDM Rules/Calculators/A&P                          [name] was evaluated in Emergency Department on 10/24/2020 for the symptoms described in the history of present illness. She was evaluated in the context of the global COVID-19 pandemic, which necessitated consideration that the patient might be at risk for infection with the SARS-CoV-2 virus that causes COVID-19. Institutional protocols and algorithms that pertain to the evaluation of patients at  risk for COVID-19 are in a state of rapid change based on information released by regulatory bodies including the CDC and federal and state organizations. These policies and algorithms were followed during the patient's care in the ED.  I personally reviewed patient's medical chart and all notes from triage and staff during today's encounter. I have also ordered and reviewed all labs and imaging that I felt to be medically necessary in the evaluation of this patient's complaints and with consideration of their physical exam. If needed, translation services were available and utilized.   Patient with left-sided lumbar region discomfort secondary to fall sustained 5 days ago.  She was already evaluated in the ED and CT obtained of her chest, abdomen, and pelvis was obtained.  I personally reviewed the findings which were negative for any compression fractures, rib fractures, or any other acute findings.    She denies any fevers, chills, urinary symptoms, new numbness or weakness, incontinence, or other symptoms.  No red flags concerning patient's back pain. No s/s of central cord compression or cauda equina.   On subsequent evaluation, patient did feel mildly improved after administration of morphine.  She states that it helped with her getting up and going to the bathroom.  Patient denies any changes from when she first was evaluated in the ER and understands importance of outpatient follow-up.  We will discharge her home with muscle relaxants and Voltaren gel to take in addition to Tylenol at home.  She also has a few remaining Percocet pills, but I cautioned her against combining with muscle relaxant.  Discussed case with Dr. Regenia Skeeter who agrees with plan.  ED return precautions discussed.  Patient voices understanding and is agreeable to plan.  Final Clinical Impression(s) / ED Diagnoses Final diagnoses:  Strain of lumbar region, initial encounter    Rx / DC Orders ED Discharge Orders          Ordered    cyclobenzaprine (FLEXERIL) 10 MG tablet  2 times daily PRN        10/24/20 0815    diclofenac Sodium (VOLTAREN) 1 % GEL  4 times daily  10/24/20 0815           Corena Herter, PA-C 10/24/20 0815    Sherwood Gambler, MD 10/24/20 [phone]

## 2020-10-24 NOTE — ED Notes (Signed)
Patient provided with bedside commode, tissues, to provide urine specimen

## 2020-10-24 NOTE — ED Notes (Addendum)
Patient provided with ice chips per request, informed of need for urine specimen

## 2020-10-29 ENCOUNTER — Other Ambulatory Visit: Payer: Self-pay | Admitting: Family Medicine

## 2020-10-29 MED ORDER — CARVEDILOL 25 MG PO TABS: 25.0000 mg | ORAL_TABLET | Freq: Two times a day (BID) | ORAL | 1 refills | Status: DC

## 2020-10-29 NOTE — Telephone Encounter (Signed)
Medication Refill - Medication: carvedilol (COREG) 25 MG tablet  Pt called to report that she is completely out of her Rx   Has the patient contacted their pharmacy? No. (Agent: If no, request that the patient contact the pharmacy for the refill.) (Agent: If yes, when and what did the pharmacy advise?)  Preferred Pharmacy (with phone number or street name):  [facility] [place], [place] - [address] [facility]  [address] [place] [address]  Phone: [phone] Fax: [phone]     Agent: Please be advised that RX refills may take up to 3 business days. We ask that you follow-up with your pharmacy.

## 2020-10-29 NOTE — Telephone Encounter (Signed)
Future appt in 2 months 

## 2020-10-29 NOTE — Chronic Care Management (AMB) (Signed)
  Care Management   Note  10/29/2020 Name: [name] MRN: [id number] DOB: 12/13/1955  [name] is a 65 y.o. year old female who is a primary care patient of [facility] and is actively engaged with the care management team. I reached out to [name] by phone today to assist with re-scheduling an initial visit with the Pharmacist  Follow up plan: Telephone appointment with care management team member scheduled for: 11/18/2020  [facility]

## 2020-11-01 ENCOUNTER — Other Ambulatory Visit: Payer: Self-pay | Admitting: Physician Assistant

## 2020-11-01 ENCOUNTER — Telehealth: Payer: Self-pay | Admitting: Physician Assistant

## 2020-11-01 DIAGNOSIS — E559 Vitamin D deficiency, unspecified: Secondary | ICD-10-CM

## 2020-11-04 NOTE — Telephone Encounter (Signed)
Requested medication (s) are due for refill today: no  Requested medication (s) are on the active medication list: yes  Last refill: 10/07/2020  Future visit scheduled: yes  Notes to clinic:  50,000 IU strengths are not delegated  Requested Prescriptions  Pending Prescriptions Disp Refills   Vitamin D, Ergocalciferol, (DRISDOL) 1.25 MG (50000 UNIT) CAPS capsule [Pharmacy Med Name: VIT D2 ERGOCAL (50,000 UNIT)] 1 capsule 11    Sig: TAKE Concepcion A MONTH      Endocrinology:  Vitamins - Vitamin D Supplementation Failed - 11/01/2020  1:59 PM      Failed - 50,000 IU strengths are not delegated      Failed - Ca in normal range and within 360 days    Calcium  Date Value Ref Range Status  09/27/2020 8.4 (L) 8.7 - 10.3 mg/dL Final   Calcium, Ion  Date Value Ref Range Status  09/13/2020 0.99 (L) 1.15 - 1.40 mmol/L Final          Failed - Vitamin D in normal range and within 360 days    No results found for: [id number], [id number], [id number], 25OHVITD3, 25OHVITD2, 25OHVITD3, 25OHVITD2, 25OHVITD1, 25OHVITD2, 25OHVITD3, VD25OH        Passed - Phosphate in normal range and within 360 days    Phosphorus  Date Value Ref Range Status  09/22/2020 3.8 2.5 - 4.6 mg/dL Final          Passed - Valid encounter within last 12 months    Recent Outpatient Visits           1 month ago Diabetic hyperosmolar non-ketotic state [facility])   [place], [place]   4 months ago Pap smear abnormality of cervix/human papillomavirus (HPV) positive   [place], [place], [place]   5 months ago Esophageal dysphagia   [place], [place], [place]   7 months ago Mouth pain   [facility] [name], [place]   9 months ago [place], [place], [place]       Future Appointments             In 2 days Fisher, Kirstie Peri, MD [facility], [place]

## 2020-11-05 ENCOUNTER — Other Ambulatory Visit (INDEPENDENT_AMBULATORY_CARE_PROVIDER_SITE_OTHER): Payer: Self-pay | Admitting: Nurse Practitioner

## 2020-11-05 ENCOUNTER — Other Ambulatory Visit: Payer: Self-pay | Admitting: Physician Assistant

## 2020-11-05 DIAGNOSIS — N186 End stage renal disease: Secondary | ICD-10-CM

## 2020-11-05 DIAGNOSIS — M1A39X0 Chronic gout due to renal impairment, multiple sites, without tophus (tophi): Secondary | ICD-10-CM

## 2020-11-05 NOTE — Telephone Encounter (Signed)
   Notes to clinic: Medication was filled by a different provider  Review for refill Patient has appt on 11/06/2020   Requested Prescriptions  Pending Prescriptions Disp Refills   allopurinol (ZYLOPRIM) 100 MG tablet [Pharmacy Med Name: ALLOPURINOL 100 MG TABLET] 28 tablet 11    Sig: TAKE 1 TABLET BY MOUTH ONCE DAILY.      Endocrinology:  Gout Agents Failed - 11/05/2020  8:29 AM      Failed - Cr in normal range and within 360 days    Creat  Date Value Ref Range Status  09/23/2016 1.36 (H) 0.50 - 1.05 mg/dL Final    Comment:      For patients > or = 63 years of age: The upper reference limit for Creatinine is approximately 13% higher for people identified as African-American.      Creatinine, Ser  Date Value Ref Range Status  09/27/2020 4.01 (H) 0.57 - 1.00 mg/dL Final    Comment:    **Verified by repeat analysis**   Creatinine, Urine  Date Value Ref Range Status  03/19/2017 94.44 mg/dL Final  03/19/2017 95.79 mg/dL Final          Passed - Uric Acid in normal range and within 360 days    Uric Acid, Serum  Date Value Ref Range Status  09/22/2020 4.4 2.5 - 7.1 mg/dL Final    Comment:    Performed at [facility], [place] [address] [place], [place] [address]   Uric Acid  Date Value Ref Range Status  02/12/2017 12.1 (H) 2.5 - 7.1 mg/dL Final    Comment:               Therapeutic target for gout patients: <6.0          Passed - Valid encounter within last 12 months    Recent Outpatient Visits           1 month ago Diabetic hyperosmolar non-ketotic state [facility])   [place], [place]   4 months ago Pap smear abnormality of cervix/human papillomavirus (HPV) positive   [place], [place]   5 months ago Esophageal dysphagia   [place], [place]   7 months ago Mouth pain   [facility] [name], [place]   9 months ago [place], [place], [place]       Future Appointments             Tomorrow Fisher, Kirstie Peri, MD [facility], [place]

## 2020-11-06 ENCOUNTER — Ambulatory Visit (INDEPENDENT_AMBULATORY_CARE_PROVIDER_SITE_OTHER): Payer: Medicare Other | Admitting: Nurse Practitioner

## 2020-11-06 ENCOUNTER — Ambulatory Visit: Payer: Self-pay | Admitting: Family Medicine

## 2020-11-06 ENCOUNTER — Encounter (INDEPENDENT_AMBULATORY_CARE_PROVIDER_SITE_OTHER): Payer: Self-pay | Admitting: Nurse Practitioner

## 2020-11-06 ENCOUNTER — Encounter (INDEPENDENT_AMBULATORY_CARE_PROVIDER_SITE_OTHER): Payer: Medicare Other

## 2020-11-06 NOTE — Progress Notes (Deleted)
Established patient visit   Patient: [name]   DOB: 1955-08-22   65 y.o. Female  MRN: [id number] Visit Date: 11/06/2020  Today's healthcare provider: Lelon Huh, MD   No chief complaint on file.  Subjective    HPI  Diabetes Mellitus Type II, Follow-up  Lab Results  Component Value Date   HGBA1C 6.8 (H) 09/27/2020   HGBA1C 8.4 (H) 09/13/2020   HGBA1C 10.0 (H) 01/30/2020   Wt Readings from Last 3 Encounters:  09/27/20 275 lb 8 oz (125 kg)  09/23/20 277 lb 12.5 oz (126 kg)  09/14/20 277 lb 12.5 oz (126 kg)   Last seen for diabetes 1 months ago.  Management since then includes Continuous Blood Gluc Sensor (FREESTYLE LIBRE 14 DAY SENSOR) MISC. She reports {excellent/good/fair/poor:19665} compliance with treatment. She {is/is not:21021397} having side effects. {document side effects if present:1} Symptoms: {Yes/No:20286} fatigue {Yes/No:20286} foot ulcerations  {Yes/No:20286} appetite changes {Yes/No:20286} nausea  {Yes/No:20286} paresthesia of the feet  {Yes/No:20286} polydipsia  {Yes/No:20286} polyuria {Yes/No:20286} visual disturbances   {Yes/No:20286} vomiting     Home blood sugar records: {diabetes glucometry results:16657}  Episodes of hypoglycemia? {Yes/No:20286} {enter symptoms and frequency of symptoms if yes:1}   Current insulin regiment: {enter 'none' or type of insulin and number of units taken with each dose of each insulin formulation that the patient is taking:1} Most Recent Eye Exam: *** {Current exercise:16438:::1} {Current diet habits:16563:::1}  Pertinent Labs: Lab Results  Component Value Date   CHOL 122 09/13/2016   HDL 36 (L) 09/13/2016   LDLCALC 52 09/13/2016   TRIG 168 (H) 09/13/2016   CHOLHDL 3.4 09/13/2016   Lab Results  Component Value Date   NA 139 09/27/2020   K 3.6 09/27/2020   CREATININE 4.01 (H) 09/27/2020   GFRNONAA 8 (L) 09/24/2020   GFRAA 6 (L) 01/31/2020   GLUCOSE 195 (H) 09/27/2020      ---------------------------------------------------------------------------------------------------   {Show patient history (optional):23778::" "}   Medications: Outpatient Medications Prior to Visit  Medication Sig  . albuterol (VENTOLIN HFA) 108 (90 Base) MCG/ACT inhaler Inhale 2 puffs into the lungs every 4 (four) hours as needed for wheezing or shortness of breath.  . allopurinol (ZYLOPRIM) 100 MG tablet TAKE 1 TABLET BY MOUTH ONCE DAILY.  [name] aspirin 81 MG EC tablet Take 1 tablet (81 mg total) by mouth daily.  [name] atorvastatin (LIPITOR) 80 MG tablet TAKE 1 TABLET BY MOUTH ONCE DAILY AT 6PM (Patient taking differently: Take 80 mg by mouth every evening.)  . AURYXIA 1 GM 210 MG(Fe) tablet Take 2 tablets (420 mg total) by mouth 3 (three) times daily with meals.  . B Complex-C-Folic Acid (RENA-VITE RX PO) Take 1 mg by mouth daily.  . B Complex-C-Folic Acid (RENA-VITE RX) 1 MG TABS Take 1 mg by mouth daily.  [name] buPROPion (WELLBUTRIN SR) 150 MG 12 hr tablet Take 1 tablet (150 mg total) by mouth 2 (two) times daily.  . carvedilol (COREG) 25 MG tablet Take 1 tablet (25 mg total) by mouth 2 (two) times daily with a meal.  . Cinacalcet HCl (SENSIPAR PO) Take 30 mg by mouth every dialysis.  [name] colchicine 0.6 MG tablet Take 1 tablet (0.6 mg total) by mouth once a week for 15 days.  . Continuous Blood Gluc Receiver (FREESTYLE LIBRE 14 DAY READER) DEVI To check blood sugar ACHS  . Continuous Blood Gluc Sensor (FREESTYLE LIBRE 14 DAY SENSOR) MISC To check blood sugar ACHS; change every 14 days  .  cyclobenzaprine (FLEXERIL) 10 MG tablet Take 1 tablet (10 mg total) by mouth 2 (two) times daily as needed for muscle spasms.  . diclofenac Sodium (VOLTAREN) 1 % GEL Apply 2 g topically 4 (four) times daily.  [name] dicyclomine (BENTYL) 20 MG tablet Take 1 tablet (20 mg total) by mouth in the morning and at bedtime.  . docusate sodium (COLACE) 100 MG capsule Take 1 capsule (100 mg total) by mouth 2 (two) times daily  as needed for mild constipation.  [name] doxepin (SINEQUAN) 10 MG capsule TAKE 1 CAPSULE(10 MG) BY MOUTH FOUR TIMES DAILY AS NEEDED FOR ITCHING (Patient taking differently: Take 10 mg by mouth 2 (two) times daily.)  . fluticasone (FLONASE) 50 MCG/ACT nasal spray Place 2 sprays into both nostrils daily as needed for allergies or rhinitis.  . fluticasone furoate-vilanterol (BREO ELLIPTA) 200-25 MCG/INH AEPB Inhale 1 puff into the lungs daily.  . hydrALAZINE (APRESOLINE) 100 MG tablet Take 100 mg by mouth 3 (three) times daily.  . hydrOXYzine (ATARAX/VISTARIL) 25 MG tablet Take 1 tablet (25 mg total) by mouth 4 (four) times daily. (Patient taking differently: Take 25 mg by mouth in the morning and at bedtime.)  . insulin detemir (LEVEMIR FLEXTOUCH) 100 UNIT/ML FlexPen Inject 60 Units into the skin 2 (two) times daily.  . insulin glargine (LANTUS) 100 unit/mL SOPN Inject 25 Units into the skin 2 (two) times daily.  . insulin lispro (HUMALOG KWIKPEN) 100 UNIT/ML KwikPen Inject 25 Units into the skin with breakfast, with lunch, and with evening meal. (Patient taking differently: Inject 7 Units into the skin 2 (two) times daily.)  . lidocaine-prilocaine (EMLA) cream Apply 1 application topically as needed. (Patient taking differently: Apply 1 application topically as needed (dialysis days).)  . loratadine (CLARITIN) 10 MG tablet TAKE 1 TABLET BY MOUTH ONCE DAILY. (Patient taking differently: Take 10 mg by mouth daily as needed for allergies.)  . montelukast (SINGULAIR) 10 MG tablet TAKE ONE TABLET BY MOUTH AT BEDTIME. (Patient taking differently: Take 10 mg by mouth at bedtime as needed (allergies).)  . nitroGLYCERIN (NITROSTAT) 0.4 MG SL tablet Place 1 tablet (0.4 mg total) under the tongue every 5 (five) minutes x 3 doses as needed for chest pain.  . Nutritional Supplements (FEEDING SUPPLEMENT, NEPRO CARB STEADY,) LIQD Take 237 mLs by mouth daily.  [name] nystatin cream (MYCOSTATIN) Apply 1 application topically 2  (two) times daily. (Patient taking differently: Apply 1 application topically daily as needed for dry skin.)  . omeprazole (PRILOSEC) 20 MG capsule Take 20 mg by mouth daily.  [name] oxyCODONE (ROXICODONE) 5 MG immediate release tablet Take 1 tablet (5 mg total) by mouth every 12 (twelve) hours as needed for severe pain.  [name] oxyCODONE-acetaminophen (PERCOCET/ROXICET) 5-325 MG tablet Take 1 tablet by mouth 4 (four) times daily as needed for pain.  . pantoprazole (PROTONIX) 40 MG tablet Take 1 tablet (40 mg total) by mouth 2 (two) times daily.  . pregabalin (LYRICA) 150 MG capsule Take 1 capsule (150 mg total) by mouth 2 (two) times daily.  . SENNA-PLUS 8.6-50 MG tablet TAKE ONE TABLET BY MOUTH AT BEDTIME.  . sevelamer carbonate (RENVELA) 800 MG tablet Take 800 mg by mouth 3 (three) times daily.  . sucralfate (CARAFATE) 1 g tablet Take 1 tablet (1 g total) by mouth 2 (two) times daily for 26 days.  [name] torsemide (DEMADEX) 20 MG tablet TAKE (2) TABLETS BY MOUTH TWICE DAILY.  [name] triamcinolone cream (KENALOG) 0.1 % APPLY TO AFFECTED AREA TWICE DAILY (  Patient taking differently: Apply 1 application topically 2 (two) times daily as needed (rash).)  . valACYclovir (VALTREX) 500 MG tablet TAKE (1) TABLET BY MOUTH EVERY OTHER DAY. (Patient taking differently: Take 500 mg by mouth every other day.)  . VICTOZA 18 MG/3ML SOPN Start with 0.6mg  daily and increase by 0.6mg  per week until max dose of 1.8 mg dose achieved. (Patient taking differently: Inject into the skin every Wednesday.)  . Vitamin D, Ergocalciferol, (DRISDOL) 1.25 MG (50000 UNIT) CAPS capsule TAKE 1 CAPSULE BY MOUTH ONCE A MONTH   No facility-administered medications prior to visit.    Review of Systems  {Labs  Heme  Chem  Endocrine  Serology  Results Review (optional):23779::" "}   Objective    There were no vitals taken for this visit. {Show previous vital signs (optional):23777::" "}   Physical Exam  ***  No results found for any visits  on 11/06/20.  Assessment & Plan     ***  No follow-ups on file.      {provider attestation***:1}   Lelon Huh, MD  [facility] [phone] (phone) [phone] (fax)  [place]

## 2020-11-13 ENCOUNTER — Telehealth: Payer: Self-pay

## 2020-11-13 NOTE — Progress Notes (Signed)
Chronic Care Management Pharmacy Assistant   Name: [name]  MRN: [id number] DOB: 12-25-1955  Reason for Encounter: Medication Review/Chart review for CPP on 11/18/2020   Conditions to be addressed/monitored: CHF, CAD, HTN, HLD, COPD, DMII, Depression and OSA,Asthma,GERD,Chronic Hepatitis C,Vertigo,AKI,ARF,Chronic Gout,Iron deficiency anemia,  Primary concerns for visit include: Patient states she would like to over all of her medications to make sure she is on the right medication and they don't interact with each other.   Recent office visits:  09/27/2020 [name] (PCP) -AMB Referral to [facility] Coordination,New meter ordered for patient,Restarted pregabalin (LYRICA) 150 MG capsule; Take 1 capsule (150 mg total) by mouth 2 (two) times daily, restarted dicyclomine (BENTYL) 20 MG tablet; Take 1 tablet (20 mg total) by mouth in the morning and at bedtime, 06/28/2021 [name] (PCP) -Ambulatory referral to Gynecology 05/31/2020 [name] (PCP) - started nystatin (MYCOSTATIN) 100000 UNIT/ML suspension 4 times a day for 10 days.Ambulatory referral to Cardiology   Recent consult visits:  06/20/2021 [name] NP ([facility]) -No medication changes indicated.  Hospital visits:  Medication Reconciliation was completed by comparing discharge summary, patient's EMR and Pharmacy list, and upon discussion with patient.  Admitted to the hospital on 10/24/2020 due to Strain of lumbar region . Discharge date was 10/24/2020. Discharged from [place]?Medications Started at [facility] Discharge:?? -started  Cyclobenzaprine 10 mg 2 times daily PRN  diclofenac Sodium (VOLTAREN) 1 % GEL  4 times daily       Medication Changes at Hospital Discharge: -Changed none  Medications Discontinued at Hospital Discharge: -Stopped none  Medications that remain the same after Hospital Discharge:??  -All other  medications will remain the same.   Admitted to the hospital on 10/22/2020 due to Contusion of rib on left side. Discharge date was 10/23/2020. Discharged from [place]?Medications Started at [facility] Discharge:?? -started  LIDOCAINE (LIDODERM) 5% Place one patch onto the skin daily for 30 days  OXYCODONE-ACETAMINOPHEN (PERCOCET,ENDOCET) 5-325 MG PER TABLET Take one tablet by mouth every 6 (six) hours as needed for Pain for up to 3 days.  Medication Changes at Hospital Discharge: -Changed None  Medications Discontinued at Hospital Discharge: -Stopped None   Medications that remain the same after Hospital Discharge:??  -All other medications will remain the same.   Admitted to the hospital on 09/17/2020 due to Gastrointestinal hemorrhage. Discharge date was 09/24/2020. Discharged from [place]?Medications Started at [facility] Discharge:?? -started Colchicine 0.6 mg, Allopurinol 100 mg  Medication Changes at Hospital Discharge: -Changed None  Medications Discontinued at Hospital Discharge: -Stopped Gentamicin Cream 0.1% Hydralazine 100 mg Nystatin 10000 unit/ML suspension Omeprazole 20 mg  Medications that remain the same after Hospital Discharge:??  -All other medications will remain the same.    Admitted to the hospital on 09/13/2020 due to Acute Pulmonary edema. Discharge date was 09/14/2020. [facility]    [place]?Medications Started at [facility] Discharge:?? -started None  Medication Changes at Hospital Discharge: -Changed None  Medications Discontinued at Hospital Discharge: -Stopped None   Medications that remain the same after Hospital Discharge:??  -All other medications will remain the same.    Admitted to the hospital on 09/03/2020 due to Episode of altered Consciousness. Discharge date was 09/03/2020. Discharged from [place]?Medications Started at  [facility] Discharge:?? -started none   Medication Changes at [facility]  Discharge: -Changed hydrogel dressings and apply aquacel ag to manage drainage, provide topical antimicrobial and encourage further healing  Medications Discontinued at Hospital Discharge: -Stopped None   Medications that remain the same after Hospital Discharge:??  -All other medications will remain the same.    Admitted to the hospital on 08/26/2020 due to Post surgical wound check. Discharge date was 08/26/2020. Discharged from [place]?Medications Started at [facility] Discharge:?? -started None  Medication Changes at Hospital Discharge: -Changed None  Medications Discontinued at Hospital Discharge: -Stopped  None  Medications that remain the same after Hospital Discharge:??  -All other medications will remain the same.    Admitted to the hospital on 07/11/2020 due to Necrotizing fasciitis. Discharge date was 08/21/2020. Discharged from [place]?Medications Started at [facility] Discharge:?? -started   camphor-menthol (SARNA,MEN-PHOR) lotion Apply topically as needed for Itching,  insulin glargine (LANTUS) 100 Unit/mL injection Inject twenty Units into the skin daily. 20 Units daily  oxyCODONE HCl (ROXICODONE) 10 mg immediate release tablet Take one tablet (10 mg dose) by mouth 3 (three) times a day as needed for up to 5 days.  sevelamer (RENVELA) 800 MG tablet Take one tablet (800 mg dose) by mouth 3 (three) times daily with meals  thiamine 100 MG TABS Take one tablet (100 mg dose) by mouth daily.  Medication Changes at Hospital Discharge: -Changed   carvedilol (COREG) 25 mg tablet Take one half tablet (12.5 mg dose) by mouth 2 (two) times daily with meals.  HUMALOG KWIKPEN 100 UNIT/ML SOPN injection Inject seven Units into the skin 3 (three) times a day for 30 days  Medications Discontinued at Hospital Discharge: -Stopped   dicyclomine (BENTYL) 20 mg  tablet   doxepin HCl (SINEQUAN) 10 mg capsule  hydrALAZINE HCl (APRESOLINE) 100 mg tablet  pregabalin (LYRICA) 150 mg capsule   torsemide (DEMADEX) 20 mg tablet  cinacalcet (SENSIPAR) 30 mg tablet  cyclobenzaprine (FLEXERIL) 5 mg tablet    Medications that remain the same after Hospital Discharge:??  -All other medications will remain the same.    Admitted to the hospital on 07/09/2020 due to Pneumonia of both lungs due to infectious organism . Discharge date was 07/09/2020. Discharged from [place]?Medications Started at [facility] Discharge:?? -started N/A  Medication Changes at Hospital Discharge: -Changed N/A  Medications Discontinued at Hospital Discharge: -Stopped N/A   Medications that remain the same after Hospital Discharge:??  -All other medications will remain the same.    Admitted to the hospital on 05/23/2020 due to Dysphagia. Discharge date was 05/23/2020. Discharged from [place]?Medications Started at [facility] Discharge:?? -started none  Medication Changes at Hospital Discharge: -Changed none  Medications Discontinued at Hospital Discharge: -Stopped none  Medications that remain the same after Hospital Discharge:??  -All other medications will remain the same.    Have you seen any other providers since your last visit? no Any changes in your medications or health? Yes  Patient states her Carvedilol was decrease to half a tablet because her blood pressure was running low.Per patient she was told recently to start taking Carvedilol 25 mg daily.Patient states she is unsure if this is correct or not. Any side effects from any medications? no Do you have an symptoms or problems not managed by your medications? no Any concerns about your health right now? yes Has your provider asked that you check blood pressure, blood sugar, or follow special diet  at home? no Do you get any type of exercise on a regular basis?  no Can you think of a goal you would like to reach for your health? None ID Do you have any problems getting your medications? no Is there anything that you would like to discuss during the appointment?   Patient states she would like to over all of her medications to make sure she is on the right medication and they don't interact with each other.  Please bring medications and supplements to appointment   Medications: Outpatient Encounter Medications as of 11/13/2020  Medication Sig  . albuterol (VENTOLIN HFA) 108 (90 Base) MCG/ACT inhaler Inhale 2 puffs into the lungs every 4 (four) hours as needed for wheezing or shortness of breath.  . allopurinol (ZYLOPRIM) 100 MG tablet TAKE 1 TABLET BY MOUTH ONCE DAILY.  [name] aspirin 81 MG EC tablet Take 1 tablet (81 mg total) by mouth daily.  [name] atorvastatin (LIPITOR) 80 MG tablet TAKE 1 TABLET BY MOUTH ONCE DAILY AT 6PM (Patient taking differently: Take 80 mg by mouth every evening.)  . AURYXIA 1 GM 210 MG(Fe) tablet Take 2 tablets (420 mg total) by mouth 3 (three) times daily with meals.  . B Complex-C-Folic Acid (RENA-VITE RX PO) Take 1 mg by mouth daily.  . B Complex-C-Folic Acid (RENA-VITE RX) 1 MG TABS Take 1 mg by mouth daily.  [name] buPROPion (WELLBUTRIN SR) 150 MG 12 hr tablet Take 1 tablet (150 mg total) by mouth 2 (two) times daily.  . carvedilol (COREG) 25 MG tablet Take 1 tablet (25 mg total) by mouth 2 (two) times daily with a meal.  . Cinacalcet HCl (SENSIPAR PO) Take 30 mg by mouth every dialysis.  [name] colchicine 0.6 MG tablet Take 1 tablet (0.6 mg total) by mouth once a week for 15 days.  . Continuous Blood Gluc Receiver (FREESTYLE LIBRE 14 DAY READER) DEVI To check blood sugar ACHS  . Continuous Blood Gluc Sensor (FREESTYLE LIBRE 14 DAY SENSOR) MISC To check blood sugar ACHS; change every 14 days  . cyclobenzaprine (FLEXERIL) 10 MG tablet Take 1 tablet (10 mg total) by mouth 2 (two) times daily as needed for muscle spasms.  . diclofenac Sodium  (VOLTAREN) 1 % GEL Apply 2 g topically 4 (four) times daily.  [name] dicyclomine (BENTYL) 20 MG tablet Take 1 tablet (20 mg total) by mouth in the morning and at bedtime.  . docusate sodium (COLACE) 100 MG capsule Take 1 capsule (100 mg total) by mouth 2 (two) times daily as needed for mild constipation.  [name] doxepin (SINEQUAN) 10 MG capsule TAKE 1 CAPSULE(10 MG) BY MOUTH FOUR TIMES DAILY AS NEEDED FOR ITCHING (Patient taking differently: Take 10 mg by mouth 2 (two) times daily.)  . fluticasone (FLONASE) 50 MCG/ACT nasal spray Place 2 sprays into both nostrils daily as needed for allergies or rhinitis.  . fluticasone furoate-vilanterol (BREO ELLIPTA) 200-25 MCG/INH AEPB Inhale 1 puff into the lungs daily.  . hydrALAZINE (APRESOLINE) 100 MG tablet Take 100 mg by mouth 3 (three) times daily.  . hydrOXYzine (ATARAX/VISTARIL) 25 MG tablet Take 1 tablet (25 mg total) by mouth 4 (four) times daily. (Patient taking differently: Take 25 mg by mouth in the morning and at bedtime.)  . insulin detemir (LEVEMIR FLEXTOUCH) 100 UNIT/ML FlexPen Inject 60 Units into the skin 2 (two) times daily.  . insulin glargine (LANTUS) 100 unit/mL SOPN Inject 25 Units into the skin 2 (two) times daily.  . insulin lispro (HUMALOG  KWIKPEN) 100 UNIT/ML KwikPen Inject 25 Units into the skin with breakfast, with lunch, and with evening meal. (Patient taking differently: Inject 7 Units into the skin 2 (two) times daily.)  . lidocaine-prilocaine (EMLA) cream Apply 1 application topically as needed. (Patient taking differently: Apply 1 application topically as needed (dialysis days).)  . loratadine (CLARITIN) 10 MG tablet TAKE 1 TABLET BY MOUTH ONCE DAILY. (Patient taking differently: Take 10 mg by mouth daily as needed for allergies.)  . montelukast (SINGULAIR) 10 MG tablet TAKE ONE TABLET BY MOUTH AT BEDTIME. (Patient taking differently: Take 10 mg by mouth at bedtime as needed (allergies).)  . nitroGLYCERIN (NITROSTAT) 0.4 MG SL tablet Place  1 tablet (0.4 mg total) under the tongue every 5 (five) minutes x 3 doses as needed for chest pain.  . Nutritional Supplements (FEEDING SUPPLEMENT, NEPRO CARB STEADY,) LIQD Take 237 mLs by mouth daily.  [name] nystatin cream (MYCOSTATIN) Apply 1 application topically 2 (two) times daily. (Patient taking differently: Apply 1 application topically daily as needed for dry skin.)  . omeprazole (PRILOSEC) 20 MG capsule Take 20 mg by mouth daily.  [name] oxyCODONE (ROXICODONE) 5 MG immediate release tablet Take 1 tablet (5 mg total) by mouth every 12 (twelve) hours as needed for severe pain.  [name] oxyCODONE-acetaminophen (PERCOCET/ROXICET) 5-325 MG tablet Take 1 tablet by mouth 4 (four) times daily as needed for pain.  . pantoprazole (PROTONIX) 40 MG tablet Take 1 tablet (40 mg total) by mouth 2 (two) times daily.  . pregabalin (LYRICA) 150 MG capsule Take 1 capsule (150 mg total) by mouth 2 (two) times daily.  . SENNA-PLUS 8.6-50 MG tablet TAKE ONE TABLET BY MOUTH AT BEDTIME.  . sevelamer carbonate (RENVELA) 800 MG tablet Take 800 mg by mouth 3 (three) times daily.  . sucralfate (CARAFATE) 1 g tablet Take 1 tablet (1 g total) by mouth 2 (two) times daily for 26 days.  [name] torsemide (DEMADEX) 20 MG tablet TAKE (2) TABLETS BY MOUTH TWICE DAILY.  [name] triamcinolone cream (KENALOG) 0.1 % APPLY TO AFFECTED AREA TWICE DAILY (Patient taking differently: Apply 1 application topically 2 (two) times daily as needed (rash).)  . valACYclovir (VALTREX) 500 MG tablet TAKE (1) TABLET BY MOUTH EVERY OTHER DAY. (Patient taking differently: Take 500 mg by mouth every other day.)  . VICTOZA 18 MG/3ML SOPN Start with 0.6mg  daily and increase by 0.6mg  per week until max dose of 1.8 mg dose achieved. (Patient taking differently: Inject into the skin every Wednesday.)  . Vitamin D, Ergocalciferol, (DRISDOL) 1.25 MG (50000 UNIT) CAPS capsule TAKE 1 CAPSULE BY MOUTH ONCE A MONTH  . [DISCONTINUED] gabapentin (NEURONTIN) 100 MG capsule Take 1  capsule (100 mg total) by mouth 3 (three) times daily.   No facility-administered encounter medications on file as of 11/13/2020.    Star Rating Drugs: Atorvastatin 80 mg last filled on 11/05/2020 for 28 day supply at [facility]. Victoza 18 mg/3ML  Web designer [phone]

## 2020-11-15 ENCOUNTER — Telehealth: Payer: Self-pay | Admitting: Physician Assistant

## 2020-11-15 ENCOUNTER — Other Ambulatory Visit: Payer: Self-pay | Admitting: Family Medicine

## 2020-11-15 ENCOUNTER — Ambulatory Visit: Payer: Medicare Other

## 2020-11-15 ENCOUNTER — Ambulatory Visit: Payer: Self-pay

## 2020-11-15 MED ORDER — HYDROCODONE-ACETAMINOPHEN 5-325 MG PO TABS: 1.0000 | ORAL_TABLET | Freq: Four times a day (QID) | ORAL | 0 refills | Status: DC | PRN

## 2020-11-15 NOTE — Telephone Encounter (Signed)
Have sent refill for pain medicine to [facility].

## 2020-11-15 NOTE — Telephone Encounter (Signed)
I don't understand this message. Does she mean a colostomy bag? If so colostomies are managed by surgerons, but I don't see anything on her record about having a colostomy. Need more info or forward to her surgeon.

## 2020-11-15 NOTE — Telephone Encounter (Signed)
Patient has only 1 colonoscopy bag which she is currently using and would like PCP nurse to call in and give a verbal with [facility] at [phone] or the [facility] location at [phone]. Patient would like a follow up call when completed
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
## 2020-11-15 NOTE — Telephone Encounter (Signed)
Please advise verbal order? 

## 2020-11-18 ENCOUNTER — Telehealth: Payer: Medicare Other

## 2020-11-18 NOTE — Progress Notes (Deleted)
Chronic Care Management Pharmacy Note  11/18/2020 Name:  [name] MRN:  [id number] DOB:  May 04, 1956  Subjective: [name] is an 65 y.o. year old female who is a primary patient of [name], [place].  The CCM team was consulted for assistance with disease management and care coordination needs.    Engaged with patient by telephone for initial visit in response to provider referral for pharmacy case management and/or care coordination services.   Consent to Services:  The patient was given the following information about Chronic Care Management services today, agreed to services, and gave verbal consent: 1. CCM service includes personalized support from designated clinical staff supervised by the primary care provider, including individualized plan of care and coordination with other care providers 2. 24/7 contact phone numbers for assistance for urgent and routine care needs. 3. Service will only be billed when office clinical staff spend 20 minutes or more in a month to coordinate care. 4. Only one practitioner may furnish and bill the service in a calendar month. 5.The patient may stop CCM services at any time (effective at the end of the month) by phone call to the office staff. 6. The patient will be responsible for cost sharing (co-pay) of up to 20% of the service fee (after annual deductible is met). Patient agreed to services and consent obtained.  Patient Care Team: [name], PA-C as PCP - General (Family Medicine) Sherren Mocha, MD as PCP - Cardiology (Cardiology) Edrick Kins, DPM as Consulting Physician (Podiatry) Germaine Pomfret, [facility] (Pharmacist)  Recent office visits: 09/27/2020 [name] (PCP) -AMB Referral to [facility] Coordination,New meter ordered for patient,Restarted pregabalin (LYRICA) 150 MG capsule; Take 1 capsule (150 mg total) by mouth 2 (two) times daily, restarted dicyclomine (BENTYL) 20 MG tablet; Take 1 tablet (20 mg  total) by mouth in the morning and at bedtime, 06/28/2021 [name] (PCP) -Ambulatory referral to Gynecology 05/31/2020 [name] (PCP) - started nystatin (MYCOSTATIN) 100000 UNIT/ML suspension 4 times a day for 10 days.Ambulatory referral to Cardiology  Recent consult visits: 06/20/2021 [name] NP ([facility]) -No medication changes indicated.  Hospital visits: Admitted to the hospital on 10/24/2020 due to Strain of lumbar region . Discharge date was 10/24/2020. Discharged from [place]?Medications Started at [facility] Discharge:?? -started  Cyclobenzaprine 10 mg 2 times daily PRN  diclofenac Sodium (VOLTAREN) 1 % GEL  4 times daily       Medication Changes at Hospital Discharge: -Changed none  Medications Discontinued at Hospital Discharge: -Stopped none  Medications that remain the same after Hospital Discharge:??  -All other medications will remain the same.   Admitted to the hospital on 10/22/2020 due to Contusion of rib on left side. Discharge date was 10/23/2020. Discharged from [place]?Medications Started at [facility] Discharge:?? -started  LIDOCAINE (LIDODERM) 5% Place one patch onto the skin daily for 30 days  OXYCODONE-ACETAMINOPHEN (PERCOCET,ENDOCET) 5-325 MG PER TABLET Take one tablet by mouth every 6 (six) hours as needed for Pain for up to 3 days.  Medication Changes at Hospital Discharge: -Changed None  Medications Discontinued at Hospital Discharge: -Stopped None   Medications that remain the same after Hospital Discharge:??  -All other medications will remain the same.   Admitted to the hospital on 09/17/2020 due to Gastrointestinal hemorrhage. Discharge date was 09/24/2020. Discharged from [place]?Medications Started at  Hospital Discharge:?? -started Colchicine 0.6 mg, Allopurinol 100 mg  Medication Changes at Hospital  Discharge: -Changed None  Medications Discontinued at Hospital Discharge: -Stopped Gentamicin Cream 0.1% Hydralazine 100 mg Nystatin 10000 unit/ML suspension Omeprazole 20 mg  Medications that remain the same after Hospital Discharge:??  -All other medications will remain the same.    Admitted to the hospital on 09/13/2020 due to Acute Pulmonary edema. Discharge date was 09/14/2020. [facility]    [place]?Medications Started at [facility] Discharge:?? -started None  Medication Changes at Hospital Discharge: -Changed None  Medications Discontinued at Hospital Discharge: -Stopped None   Medications that remain the same after Hospital Discharge:??  -All other medications will remain the same.    Admitted to the hospital on 09/03/2020 due to Episode of altered Consciousness. Discharge date was 09/03/2020. Discharged from [place]?Medications Started at [facility] Discharge:?? -started none   Medication Changes at Hospital Discharge: -Changed hydrogel dressings and apply aquacel ag to manage drainage, provide topical antimicrobial and encourage further healing  Medications Discontinued at Hospital Discharge: -Stopped None   Medications that remain the same after Hospital Discharge:??  -All other medications will remain the same.    Admitted to the hospital on 08/26/2020 due to Post surgical wound check. Discharge date was 08/26/2020. Discharged from [place]?Medications Started at [facility] Discharge:?? -started None  Medication Changes at Hospital Discharge: -Changed None  Medications Discontinued at Hospital Discharge: -Stopped  None  Medications that remain the same after Hospital Discharge:??  -All other medications will remain the same.    Admitted to the hospital on 07/11/2020 due to Necrotizing fasciitis. Discharge date was 08/21/2020. Discharged from [place]?Medications Started at [facility] Discharge:?? -started   camphor-menthol (SARNA,MEN-PHOR) lotion Apply topically as needed for Itching,  insulin glargine (LANTUS) 100 Unit/mL injection Inject twenty Units into the skin daily. 20 Units daily  oxyCODONE HCl (ROXICODONE) 10 mg immediate release tablet Take one tablet (10 mg dose) by mouth 3 (three) times a day as needed for up to 5 days.  sevelamer (RENVELA) 800 MG tablet Take one tablet (800 mg dose) by mouth 3 (three) times daily with meals  thiamine 100 MG TABS Take one tablet (100 mg dose) by mouth daily.  Medication Changes at Hospital Discharge: -Changed   carvedilol (COREG) 25 mg tablet Take one half tablet (12.5 mg dose) by mouth 2 (two) times daily with meals.  HUMALOG KWIKPEN 100 UNIT/ML SOPN injection Inject seven Units into the skin 3 (three) times a day for 30 days  Medications Discontinued at Hospital Discharge: -Stopped   dicyclomine (BENTYL) 20 mg tablet   doxepin HCl (SINEQUAN) 10 mg capsule  hydrALAZINE HCl (APRESOLINE) 100 mg tablet  pregabalin (LYRICA) 150 mg capsule   torsemide (DEMADEX) 20 mg tablet  cinacalcet (SENSIPAR) 30 mg tablet  cyclobenzaprine (FLEXERIL) 5 mg tablet   Medications that remain the same after Hospital Discharge:??  -All other medications will remain the same.    Admitted to the hospital on 07/09/2020 due to Pneumonia of both lungs due to infectious organism . Discharge date was 07/09/2020. Discharged from [place]?Medications Started at [facility] Discharge:?? -started N/A  Medication Changes at Hospital Discharge: -Changed N/A  Medications Discontinued at Hospital Discharge: -Stopped N/A   Medications that remain the same after Hospital Discharge:??  -All other medications will remain the same.    Admitted to the hospital  on 05/23/2020 due to Dysphagia. Discharge date was 05/23/2020. Discharged from [place]?Medications Started at [facility] Discharge:?? -started none  Medication Changes at Hospital Discharge: -Changed none  Medications Discontinued at Hospital Discharge: -Stopped none  Medications that remain the same after Hospital Discharge:??  -All other medications will remain the same.  Objective:  Lab Results  Component Value Date   CREATININE 4.01 (H) 09/27/2020   BUN 9 09/27/2020   GFR 56.38 (L) 10/11/2014   GFRNONAA 8 (L) 09/24/2020   GFRAA 6 (L) 01/31/2020   NA 139 09/27/2020   K 3.6 09/27/2020   CALCIUM 8.4 (L) 09/27/2020   CO2 25 09/27/2020   GLUCOSE 195 (H) 09/27/2020    Lab Results  Component Value Date/Time   HGBA1C 6.8 (H) 09/27/2020 02:17 PM   HGBA1C 8.4 (H) 09/13/2020 05:56 AM   GFR 56.38 (L) 10/11/2014 10:17 AM   MICROALBUR 237.2 (H) 11/08/2014 10:18 AM    Last diabetic Eye exam:  Lab Results  Component Value Date/Time   HMDIABEYEEXA Retinopathy (A) 10/14/2017 12:00 AM    Last diabetic Foot exam: No results found for: HMDIABFOOTEX   Lab Results  Component Value Date   CHOL 122 09/13/2016   HDL 36 (L) 09/13/2016   LDLCALC 52 09/13/2016   TRIG 168 (H) 09/13/2016   CHOLHDL 3.4 09/13/2016    Hepatic Function Latest Ref Rng & Units 09/27/2020 09/22/2020 09/21/2020  Total Protein 6.0 - 8.5 g/dL 6.8 - -  Albumin 3.8 - 4.8 g/dL 4.0 2.5(L) 2.5(L)  AST 0 - 40 IU/L 41(H) - -  ALT 0 - 32 IU/L 36(H) - -  Alk Phosphatase 44 - 121 IU/L 299(H) - -  Total Bilirubin 0.0 - 1.2 mg/dL 0.4 - -  Bilirubin, Direct 0.0 - 0.2 mg/dL - - -    Lab Results  Component Value Date/Time   TSH 2.096 01/31/2020 01:48 AM   TSH 1.560 12/28/2018 11:16 PM   TSH 1.254 03/26/2012 12:25 PM   TSH 3.858 ***Test methodology is 3rd generation TSH*** 04/05/2008 10:20 PM    CBC Latest Ref Rng & Units 09/27/2020 09/23/2020 09/22/2020  WBC 3.4 - 10.8 x10E3/uL 5.5 6.0 5.5  Hemoglobin 11.1 - 15.9 g/dL 9.7(L) 8.8(L) 7.2(L)  Hematocrit 34.0 - 46.6 % 28.9(L) 27.2(L) 21.9(L)   Platelets 150 - 450 x10E3/uL 209 193 175    No results found for: VD25OH  Clinical ASCVD: {YES/NO:21197} The ASCVD Risk score Mikey Bussing DC Jr., et al., 2013) failed to calculate for the following reasons:   The valid total cholesterol range is 130 to 320 mg/dL    Depression screen [facility] 2/9 06/28/2020 05/31/2020 12/26/2019  Decreased Interest 1 3 0  Down, Depressed, Hopeless _0 PHQ - 2 Score _1 Altered sleeping 1 2 -  Tired, decreased energy 1 3 -  Change in appetite 1 3 -  Feeling bad or failure about yourself  3 1 -  Trouble concentrating 1 3 -  Moving slowly or fidgety/restless 1 3 -  Suicidal thoughts 0 0 -  PHQ-9 Score 10 20 -  Difficult doing work/chores Not difficult at all Very difficult -  Some recent data might be hidden     ***Other: (CHADS2VASc if Afib, MMRC or CAT for COPD, ACT, DEXA)  Social History   Tobacco Use  Smoking Status Former Smoker  . Packs/day: 0.25  . Years: 6.00  . Pack years: 1.50  . Types: Cigarettes  . Quit date:  10/25/1980  . Years since quitting: 40.0  Smokeless Tobacco Never Used   BP Readings from Last 3 Encounters:  10/24/20 (!) 165/90  09/27/20 (!) 168/69  09/24/20 (!) 156/64   Pulse Readings from Last 3 Encounters:  10/24/20 86  09/27/20 97  09/24/20 88   Wt Readings from Last 3 Encounters:  09/27/20 275 lb 8 oz (125 kg)  09/23/20 277 lb 12.5 oz (126 kg)  09/14/20 277 lb 12.5 oz (126 kg)   BMI Readings from Last 3 Encounters:  09/27/20 41.89 kg/m  09/23/20 42.24 kg/m  09/14/20 42.24 kg/m    Assessment/Interventions: Review of patient past medical history, allergies, medications, health status, including review of consultants reports, laboratory and other test data, was performed as part of comprehensive evaluation and provision of chronic care management services.   SDOH:  (Social Determinants of Health) assessments and interventions performed: {yes/no:20286}  SDOH Screenings   Alcohol Screen: Low Risk   .  Last Alcohol Screening Score (AUDIT): 1  Depression (PHQ2-9): Medium Risk  . PHQ-2 Score: 10  Financial Resource Strain: Low Risk   . Difficulty of Paying Living Expenses: Not hard at all  Food Insecurity: No Food Insecurity  . Worried About Charity fundraiser in the Last Year: Never true  . Ran Out of Food in the Last Year: Never true  Housing: Low Risk   . Last Housing Risk Score: 0  Physical Activity: Inactive  . Days of Exercise per Week: 0 days  . Minutes of Exercise per Session: 0 min  Social Connections: Socially Isolated  . Frequency of Communication with Friends and Family: Twice a week  . Frequency of Social Gatherings with Friends and Family: Never  . Attends Religious Services: 1 to 4 times per year  . Active Member of Clubs or Organizations: No  . Attends Archivist Meetings: Never  . Marital Status: Divorced  Stress: Stress Concern Present  . Feeling of Stress : To some extent  Tobacco Use: Medium Risk  . Smoking Tobacco Use: Former Smoker  . Smokeless Tobacco Use: Never Used  Transportation Needs: No Transportation Needs  . Lack of Transportation (Medical): No  . Lack of Transportation (Non-Medical): No    CCM Care Plan  Allergies  Allergen Reactions  . Morphine Itching    Tolerated hydromorphone on 07/2020  . Shellfish Allergy Anaphylaxis and Swelling  . Diazepam Other (See Comments)    "felt like out of body experience"    Medications Reviewed Today    Reviewed by Rubye Beach (Physician Assistant Certified) on 63[birth year] at [place] List Status: <None>  Medication Order Taking? Sig Documenting Provider Last Dose Status Informant  albuterol (VENTOLIN HFA) 108 (90 Base) MCG/ACT inhaler [id number] Yes Inhale 2 puffs into the lungs every 4 (four) hours as needed for wheezing or shortness of breath. [name], PA-C Taking Active Self  allopurinol (ZYLOPRIM) 100 MG tablet [id number] Yes Take 1 tablet (100 mg total) by mouth  daily. Thurnell Lose, MD Taking Active   aspirin 81 MG EC tablet [id number] Yes Take 1 tablet (81 mg total) by mouth daily. Thurnell Lose, MD Taking Active   atorvastatin (LIPITOR) 80 MG tablet [id number] Yes TAKE 1 TABLET BY MOUTH ONCE DAILY AT 6PM  Patient taking differently: Take 80 mg by mouth every evening.   [name], PA-C Taking Active   AURYXIA 1 GM 210 MG(Fe) tablet [id number] Yes Take 2 tablets (420 mg total) by mouth  3 (three) times daily with meals. [name], PA-C Taking Active Self  B Complex-C-Folic Acid (RENA-VITE RX PO) [id number] Yes Take 1 mg by mouth daily. [provider] Taking Active Self  buPROPion (WELLBUTRIN SR) 150 MG 12 hr tablet [id number] Yes Take 1 tablet (150 mg total) by mouth 2 (two) times daily. [name], PA-C Taking Active Self  carvedilol (COREG) 25 MG tablet [id number] Yes TAKE (1) TABLET BY MOUTH TWICE A DAY WITH MEALS (BREAKFAST AND SUPPER)  Patient taking differently: Take 25 mg by mouth 2 (two) times daily with a meal.   [name], PA-C Taking Active   Cinacalcet HCl (SENSIPAR PO) [id number] Yes Take 30 mg by mouth every dialysis. [provider] Taking Active Self  colchicine 0.6 MG tablet [id number] Yes Take 1 tablet (0.6 mg total) by mouth once a week for 15 days. Thurnell Lose, MD Taking Active   Continuous Blood Gluc Receiver (FREESTYLE LIBRE 14 DAY READER) [place] [id number]  To check blood sugar ACHS [name], PA-C  Active   Continuous Blood Gluc Sensor (FREESTYLE LIBRE 14 DAY SENSOR) [place] [id number]  To check blood sugar ACHS; change every 14 days [name] M, [place]  Active   cyclobenzaprine (FLEXERIL) 5 MG tablet [id number] Yes Take 1 tablet (5 mg total) by mouth 3 (three) times daily as needed for muscle spasms. [name], PA-C Taking Active Self  dicyclomine (BENTYL) 20 MG tablet [id number]  Take 1 tablet (20 mg total) by mouth in the morning and at bedtime.  [name] M, PA-C  Active   docusate sodium (COLACE) 100 MG capsule [id number] Yes Take 1 capsule (100 mg total) by mouth 2 (two) times daily as needed for mild constipation. Nicholes Mango, MD Taking Active Self  doxepin (SINEQUAN) 10 MG capsule [id number] Yes TAKE 1 CAPSULE(10 MG) BY MOUTH FOUR TIMES DAILY AS NEEDED FOR ITCHING  Patient taking differently: Take 10 mg by mouth 2 (two) times daily.   [name] M, PA-C Taking Active   fluticasone [facility]) 50 MCG/ACT nasal spray [id number] Yes Place 2 sprays into both nostrils daily as needed for allergies or rhinitis. [name], PA-C Taking Active Self  fluticasone furoate-vilanterol (BREO ELLIPTA) 200-25 MCG/INH AEPB [id number] Yes Inhale 1 puff into the lungs daily. [name], [place] Taking Active Self        Discontinued 09/01/17 1623 (Dose change)   hydrOXYzine (ATARAX/VISTARIL) 25 MG tablet [id number] Yes Take 1 tablet (25 mg total) by mouth 4 (four) times daily.  Patient taking differently: Take 25 mg by mouth in the morning and at bedtime.   [name], PA-C Taking Active Self  insulin detemir (LEVEMIR FLEXTOUCH) 100 UNIT/ML FlexPen [id number] Yes Inject 60 Units into the skin 2 (two) times daily. [name], PA-C Taking Active Self  insulin glargine (LANTUS) 100 unit/mL SOPN [id number] Yes Inject 25 Units into the skin 2 (two) times daily. [provider] Taking Active Self  insulin lispro (HUMALOG KWIKPEN) 100 UNIT/ML KwikPen [id number] Yes Inject 25 Units into the skin with breakfast, with lunch, and with evening meal.  Patient taking differently: Inject 7 Units into the skin 2 (two) times daily.   [name] M, PA-C Taking Active   lidocaine-prilocaine (EMLA) cream [id number] Yes Apply 1 application topically as needed.  Patient taking differently: Apply 1 application topically as needed (dialysis days).   Schnier, Dolores Lory, MD Taking Active   loperamide (IMODIUM A-D) 2 MG  tablet [id number]  Yes Take 1 tablet (2 mg total) by mouth as needed (diarrhea). [provider] Taking Active Self  loratadine (CLARITIN) 10 MG tablet [id number] Yes TAKE 1 TABLET BY MOUTH ONCE DAILY.  Patient taking differently: Take 10 mg by mouth daily as needed for allergies.   [name] M, PA-C Taking Active   montelukast (SINGULAIR) 10 MG tablet [id number] Yes TAKE ONE TABLET BY MOUTH AT BEDTIME.  Patient taking differently: Take 10 mg by mouth at bedtime as needed (allergies).   [name] M, PA-C Taking Active   nitroGLYCERIN (NITROSTAT) 0.4 MG SL tablet [id number] Yes Place 1 tablet (0.4 mg total) under the tongue every 5 (five) minutes x 3 doses as needed for chest pain. [name], PA-C Taking Active Self  Nutritional Supplements (FEEDING SUPPLEMENT, NEPRO CARB STEADY,) LIQD [id number] Yes Take 237 mLs by mouth daily. Loletha Grayer, MD Taking Active Self  nystatin cream (MYCOSTATIN) [id number] Yes Apply 1 application topically 2 (two) times daily.  Patient taking differently: Apply 1 application topically daily as needed for dry skin.   [name], PA-C Taking Active   oxyCODONE (ROXICODONE) 5 MG immediate release tablet [id number] Yes Take 1 tablet (5 mg total) by mouth every 12 (twelve) hours as needed for severe pain. [name], PA-C Taking Active Self  pantoprazole (PROTONIX) 40 MG tablet [id number] Yes Take 1 tablet (40 mg total) by mouth 2 (two) times daily. Thurnell Lose, MD Taking Active   pregabalin (LYRICA) 150 MG capsule [id number]  Take 1 capsule (150 mg total) by mouth 2 (two) times daily. Rubye Beach  Active   SENNA-PLUS 8.6-50 MG tablet [id number] Yes TAKE ONE TABLET BY MOUTH AT BEDTIME. [name], PA-C Taking Active Self  sevelamer carbonate (RENVELA) 800 MG tablet [id number] Yes Take 800 mg by mouth 3 (three) times daily. [provider] Taking Active Self  sucralfate (CARAFATE) 1 g  tablet [id number] Yes Take 1 tablet (1 g total) by mouth 2 (two) times daily for 26 days. Thurnell Lose, MD Taking Active   torsemide (DEMADEX) 20 MG tablet [id number] Yes TAKE (2) TABLETS BY MOUTH TWICE DAILY.  Patient taking differently: Take 40 mg by mouth 2 (two) times daily.   [name] M, PA-C Taking Active   triamcinolone cream (KENALOG) 0.1 % [id number] Yes APPLY TO AFFECTED AREA TWICE DAILY  Patient taking differently: Apply 1 application topically 2 (two) times daily as needed (rash).   [name] M, PA-C Taking Active   valACYclovir (VALTREX) 500 MG tablet [id number] Yes TAKE (1) TABLET BY MOUTH EVERY OTHER DAY.  Patient taking differently: Take 500 mg by mouth every other day.   [name], PA-C Taking Active   VICTOZA 18 MG/3ML SOPN [id number] Yes Start with 0.71m daily and increase by 0.659mper week until max dose of 1.8 mg dose achieved.  Patient taking differently: Inject into the skin every Wednesday.   BuMar DaringPA-C Taking Active Self  Vitamin D, Ergocalciferol, (DRISDOL) 1.25 MG (50000 UNIT) CAPS capsule [id number] 1 CAPSULE BY MOUTH ONCE A MONTH [place], JeClearnce SorrelPA-C  Active   Med List Note (HGentry RochCPhT 09/18/19 [id number] Dialysis [facility]          Patient Active Problem List   Diagnosis Date Noted  . Upper GI bleed 09/17/2020  . Prolonged QT interval 09/17/2020  . COPD (chronic obstructive pulmonary disease) (HCAnthony03/02/2021  . Acute pulmonary edema (HCG[place]03/11/2020  .  Acute respiratory failure with hypoxia ([place]) 09/13/2020  . Allergy, unspecified, initial encounter 03/29/2020  . Anaphylactic shock, unspecified, initial encounter 03/29/2020  . Volume overload 01/30/2020  . Pain in left upper arm 01/18/2020  . Cellulitis 01/16/2020  . Anemia due to end stage renal disease ([place]) 01/16/2020  . Diabetic polyneuropathy associated with type 2 diabetes mellitus ([place]) 11/06/2019  . Nystagmus  11/06/2019  . Intermittent claudication ([place]) 11/06/2019  . Vertigo of central origin 11/06/2019  . Weakness   . Hypervolemia associated with renal insufficiency 10/31/2019  . BMI 45.0-49.9, adult ([place]) 10/18/2019  . Ascending aortic aneurysm ([place]) 03/27/2019  . Puncture wound without foreign body of left forearm, initial encounter 01/02/2019  . Non-healing wound of upper extremity 12/28/2018  . Unspecified open wound of left upper arm, initial encounter 12/06/2018  . Complication from renal dialysis device 10/20/2018  . Iron deficiency anemia, unspecified 10/18/2018  . ESRD (end stage renal disease) on dialysis ([place]) 06/01/2018  . ARF (acute renal failure) ([place]) 05/09/2018  . Colon cancer screening   . LGSIL on Pap smear of cervix 01/27/2018  . Gout 12/25/2017  . AKI (acute kidney injury) ([place]) 12/24/2017  . Hyperglycemia 12/24/2017  . Chronic diastolic heart failure ([place]) 09/30/2017  . Lymphedema 09/30/2017  . Aortic stenosis   . Acute pain of both knees 02/12/2017  . Chronic gout due to renal impairment of multiple sites without tophus 02/12/2017  . Esophageal dysphagia 02/12/2017  . Osteoarthritis 01/22/2017  . Diabetic hyperosmolar non-ketotic state ([place]) 12/13/2016  . CAD (coronary artery disease) 12/13/2016  . Hyperlipidemia 12/13/2016  . Hyperphosphatemia 12/13/2016  . Acute diastolic CHF (congestive heart failure) ([place])   . Hyperkalemia 08/21/2016  . Asthma 11/29/2015  . Depression 09/05/2015  . GERD (gastroesophageal reflux disease) 03/25/2015  . Environmental allergies 03/14/2015  . SOB (shortness of breath) 02/15/2015  . Chronic hepatitis C without hepatic coma ([place]) 01/31/2015  . Diabetic neuropathy ([place]) 01/31/2015  . OSA (obstructive sleep apnea) 01/01/2015  . Poor dentition 11/13/2014  . Essential hypertension 10/08/2014  . Morbid (severe) obesity due to excess calories ([place]) 10/08/2014  . Localized, primary osteoarthritis 01/25/2014  . Family history of  diabetes mellitus 03/31/2013  . Pain in limb 03/31/2013  . Uncontrolled type 2 diabetes mellitus with hyperosmolar nonketotic hyperglycemia ([place]) 03/25/2012    Class: Chronic    Immunization History  Administered Date(s) Administered  . Hepatitis B, adult 06/30/2018, 07/28/2018, 09/01/2018  . Hepatitis B, ped/adol 06/30/2018, 07/28/2018, 09/01/2018  . Influenza Split 03/27/2012  . Influenza,inj,Quad PF,6+ Mos 10/03/2014, 04/08/2015, 05/19/2016, 04/29/2018, 04/06/2019, 05/31/2020  . Moderna Sars-Covid-2 Vaccination 08/20/2020  . Pneumococcal Conjugate-13 07/07/2018  . Pneumococcal Polysaccharide-23 03/26/2012, 09/20/2018  . Tdap 06/18/2014    Conditions to be addressed/monitored:  Hypertension, Hyperlipidemia, Diabetes, Heart Failure, Coronary Artery Disease, GERD, COPD, Asthma, Depression, Osteoarthritis, Gout and End Stage Renal Disease  There are no care plans that you recently modified to display for this patient.    Medication Assistance: {MEDASSISTANCEINFO:25044}  Patient's preferred pharmacy is:  [id number] [address], [place] [address] [place] [address] Phone: [phone] Fax: [place], [place] - [address] [place] [address] Phone: [phone] Fax: [phone]  Uses pill box? {Yes or If no, why not?:20788} Pt endorses ***% compliance  We discussed: {Pharmacy options:24294} Patient decided to: {US Pharmacy Plan:23885}  Care Plan and Follow Up Patient Decision:  {FOLLOWUP:24991}  Plan: {CM FOLLOW UP  BPZW:25852}  ***   Current Barriers:  . {pharmacybarriers:24917}  Pharmacist Clinical Goal(s):  [name] Patient will {PHARMACYGOALCHOICES:24921} through collaboration with PharmD and provider.   Interventions: . 1:1 collaboration with [name], PA-C regarding development and update of comprehensive plan of care as evidenced by  provider attestation and co-signature . Inter-disciplinary care team collaboration (see longitudinal plan of care) . Comprehensive medication review performed; medication list updated in electronic medical record  Heart Failure (Goal: manage symptoms and prevent exacerbations) -{US controlled/uncontrolled:25276} -Last ejection fraction: *** (Date: ***) -HF type: {type of heart failure:30421350} -NYHA Class: {CHL HP Upstream Pharm NYHA [phone]} -AHA HF Stage: {CHL HP Upstream Pharm AHA HF Stage:[phone]} -Current treatment: . Carvedilol 25 mg twice daily  . Hydralazine 100 mg three times daily  . Torsemide 20 mg 2 tablets twice daily  -Medications previously tried: ***  -Current home BP/HR readings: *** -Current dietary habits: *** -Current exercise habits: *** -Educated on {CCM HF Counseling:25125} -{CCMPHARMDINTERVENTION:25122}    Hyperlipidemia: (LDL goal < ***) -{US controlled/uncontrolled:25276} -Current treatment: . Atorvastatin 80 mg daily  -Medications previously tried: ***  -Current dietary patterns: *** -Current exercise habits: *** -Educated on {CCM HLD Counseling:25126} -{CCMPHARMDINTERVENTION:25122}  Diabetes (A1c goal <8%) -Controlled -Current medications: . Levemir 60 units twice daily  . Lantus 25 units twice daily  . Humalog 7-25 units twice daily  . Victoza 1.8 mg weekly -Medications previously tried: Byetta, Trulicity, Bydureon, Novolog, 70/30, Januvia, Toujeo  -Current home glucose readings . fasting glucose: *** . post prandial glucose: *** -{ACTIONS;DENIES/REPORTS:21021675::"Denies"} hypoglycemic/hyperglycemic symptoms -Current meal patterns:  . breakfast: ***  . lunch: ***  . dinner: *** . snacks: *** . drinks: *** -Current exercise: *** -Educated on {CCM DM COUNSELING:25123} -Counseled to check feet daily and get yearly eye exams -{CCMPHARMDINTERVENTION:25122}  Asthma/COPD (Goal: control symptoms and prevent  exacerbations) -{US controlled/uncontrolled:25276} -Current treatment  . Ventolin HFA 2 puffs every 4 hours as needed . Breo Ellipta 1 puff daily  . Montelukast 10 mg nightly  -Medications previously tried: ***  -Gold Grade: {CHL HP Upstream Pharm COPD Gold DPOEU:[id number]} -Current COPD Classification:  {CHL HP Upstream Pharm COPD Classification:[phone]} -MMRC/CAT score: *** -Pulmonary function testing: *** -Exacerbations requiring treatment in last 6 months: *** -Patient {Actions; denies-reports:120008} consistent use of maintenance inhaler -Frequency of rescue inhaler use: *** -Counseled on {CCMINHALERCOUNSELING:25121} -{CCMPHARMDINTERVENTION:25122}  Depression/Anxiety (Goal: ***) -{US controlled/uncontrolled:25276} -Current treatment: . Wellbutrin SR 150 mg twice daily  . Hydroxyzine 25 mg four times daily  -Medications previously tried/failed: *** -PHQ9: *** -GAD7: *** -Connected with *** for mental health support -Educated on {CCM mental health counseling:25127} -{CCMPHARMDINTERVENTION:25122}  Gout (Goal: ***) -{US controlled/uncontrolled:25276} -Current treatment  . Allopurinol 100 mg daily  -Medications previously tried: ***  -{CCMPHARMDINTERVENTION:25122}   Patient Goals/Self-Care Activities . Patient will:  - {pharmacypatientgoals:24919}  Follow Up Plan: {CM FOLLOW UP VQMG:86761}

## 2020-11-19 ENCOUNTER — Encounter: Payer: Self-pay | Admitting: Family Medicine

## 2020-11-19 ENCOUNTER — Ambulatory Visit (INDEPENDENT_AMBULATORY_CARE_PROVIDER_SITE_OTHER): Payer: Medicare Other | Admitting: Family Medicine

## 2020-11-19 ENCOUNTER — Other Ambulatory Visit: Payer: Self-pay

## 2020-11-19 DIAGNOSIS — G4733 Obstructive sleep apnea (adult) (pediatric): Secondary | ICD-10-CM

## 2020-11-19 DIAGNOSIS — I5042 Chronic combined systolic (congestive) and diastolic (congestive) heart failure: Secondary | ICD-10-CM | POA: Diagnosis not present

## 2020-11-19 DIAGNOSIS — I10 Essential (primary) hypertension: Secondary | ICD-10-CM

## 2020-11-19 MED ORDER — HYDROCODONE-ACETAMINOPHEN 5-325 MG PO TABS: 1.0000 | ORAL_TABLET | Freq: Four times a day (QID) | ORAL | 0 refills | Status: AC | PRN

## 2020-11-19 NOTE — Telephone Encounter (Signed)
Patient has an appointment with Dr. Rosanna Randy today at 1pm. Please advise of message during appointment.
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
## 2020-11-19 NOTE — Telephone Encounter (Signed)
Patient requested Rx for colostomy bags to go to [facility] supply store in [place]. Called in Rx, and they report that the patient does not need a Rx for colostomy bags. She can go in the store and by them. I advised patient of this. She verbally understands.

## 2020-11-29 ENCOUNTER — Telehealth: Payer: Medicare Other

## 2020-12-02 ENCOUNTER — Other Ambulatory Visit: Payer: Self-pay

## 2020-12-02 ENCOUNTER — Other Ambulatory Visit: Payer: Self-pay | Admitting: Physician Assistant

## 2020-12-02 DIAGNOSIS — I5032 Chronic diastolic (congestive) heart failure: Secondary | ICD-10-CM

## 2020-12-02 DIAGNOSIS — A6000 Herpesviral infection of urogenital system, unspecified: Secondary | ICD-10-CM

## 2020-12-02 NOTE — Telephone Encounter (Signed)
[facility] faxed refill request for the following medications:  hydrOXYzine (ATARAX/VISTARIL) 25 MG tablet   Please advise.

## 2020-12-03 MED ORDER — HYDROXYZINE HCL 25 MG PO TABS: 25.0000 mg | ORAL_TABLET | Freq: Four times a day (QID) | ORAL | 1 refills | Status: DC

## 2020-12-03 NOTE — Telephone Encounter (Signed)
Please change these messages to refill request
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
## 2020-12-06 ENCOUNTER — Ambulatory Visit: Payer: Medicare Other | Admitting: Orthopaedic Surgery

## 2020-12-13 ENCOUNTER — Ambulatory Visit: Payer: Medicare Other | Admitting: Orthopaedic Surgery

## 2020-12-19 ENCOUNTER — Ambulatory Visit: Payer: Medicare Other | Admitting: Orthopaedic Surgery

## 2020-12-30 ENCOUNTER — Ambulatory Visit: Payer: Self-pay | Admitting: Family Medicine

## 2020-12-31 ENCOUNTER — Encounter: Payer: Self-pay | Admitting: *Deleted

## 2020-12-31 ENCOUNTER — Other Ambulatory Visit: Payer: Self-pay

## 2020-12-31 DIAGNOSIS — Z992 Dependence on renal dialysis: Secondary | ICD-10-CM | POA: Diagnosis not present

## 2020-12-31 DIAGNOSIS — Z7951 Long term (current) use of inhaled steroids: Secondary | ICD-10-CM | POA: Diagnosis not present

## 2020-12-31 DIAGNOSIS — R1310 Dysphagia, unspecified: Secondary | ICD-10-CM | POA: Diagnosis present

## 2020-12-31 DIAGNOSIS — E1122 Type 2 diabetes mellitus with diabetic chronic kidney disease: Secondary | ICD-10-CM | POA: Diagnosis not present

## 2020-12-31 DIAGNOSIS — Z7982 Long term (current) use of aspirin: Secondary | ICD-10-CM | POA: Insufficient documentation

## 2020-12-31 DIAGNOSIS — E1165 Type 2 diabetes mellitus with hyperglycemia: Secondary | ICD-10-CM | POA: Diagnosis not present

## 2020-12-31 DIAGNOSIS — Z20822 Contact with and (suspected) exposure to covid-19: Secondary | ICD-10-CM | POA: Insufficient documentation

## 2020-12-31 DIAGNOSIS — I5042 Chronic combined systolic (congestive) and diastolic (congestive) heart failure: Secondary | ICD-10-CM | POA: Diagnosis not present

## 2020-12-31 DIAGNOSIS — N186 End stage renal disease: Secondary | ICD-10-CM | POA: Insufficient documentation

## 2020-12-31 DIAGNOSIS — I2510 Atherosclerotic heart disease of native coronary artery without angina pectoris: Secondary | ICD-10-CM | POA: Insufficient documentation

## 2020-12-31 DIAGNOSIS — Z79899 Other long term (current) drug therapy: Secondary | ICD-10-CM | POA: Diagnosis not present

## 2020-12-31 DIAGNOSIS — E1142 Type 2 diabetes mellitus with diabetic polyneuropathy: Secondary | ICD-10-CM | POA: Insufficient documentation

## 2020-12-31 DIAGNOSIS — I132 Hypertensive heart and chronic kidney disease with heart failure and with stage 5 chronic kidney disease, or end stage renal disease: Secondary | ICD-10-CM | POA: Insufficient documentation

## 2020-12-31 DIAGNOSIS — Z7984 Long term (current) use of oral hypoglycemic drugs: Secondary | ICD-10-CM | POA: Insufficient documentation

## 2020-12-31 DIAGNOSIS — R1084 Generalized abdominal pain: Secondary | ICD-10-CM | POA: Diagnosis not present

## 2020-12-31 DIAGNOSIS — Z87891 Personal history of nicotine dependence: Secondary | ICD-10-CM | POA: Insufficient documentation

## 2020-12-31 DIAGNOSIS — J45909 Unspecified asthma, uncomplicated: Secondary | ICD-10-CM | POA: Diagnosis not present

## 2020-12-31 DIAGNOSIS — Z794 Long term (current) use of insulin: Secondary | ICD-10-CM | POA: Diagnosis not present

## 2020-12-31 NOTE — ED Triage Notes (Signed)
Pt says that when she eats she has a hard time swallowing the food, she feels like it cannot go down all the way. Also has pain in the left side of her abdomen when she eats (pt has colostomy)

## 2021-01-01 ENCOUNTER — Emergency Department: Payer: Medicare Other

## 2021-01-01 ENCOUNTER — Emergency Department
Admission: EM | Admit: 2021-01-01 | Discharge: 2021-01-01 | Disposition: A | Discharge status: Home / Self Care | Payer: Medicare Other | Attending: Emergency Medicine | Admitting: Emergency Medicine

## 2021-01-01 DIAGNOSIS — R1310 Dysphagia, unspecified: Secondary | ICD-10-CM

## 2021-01-01 DIAGNOSIS — R739 Hyperglycemia, unspecified: Secondary | ICD-10-CM

## 2021-01-01 DIAGNOSIS — R1084 Generalized abdominal pain: Secondary | ICD-10-CM

## 2021-01-01 LAB — COMPREHENSIVE METABOLIC PANEL
ALT: 37 U/L (ref 0–44)
AST: 32 U/L (ref 15–41)
Albumin: 3.3 g/dL — ABNORMAL LOW (ref 3.5–5.0)
Alkaline Phosphatase: 430 U/L — ABNORMAL HIGH (ref 38–126)
Anion gap: 13 (ref 5–15)
BUN: 62 mg/dL — ABNORMAL HIGH (ref 8–23)
CO2: 24 mmol/L (ref 22–32)
Calcium: 7.9 mg/dL — ABNORMAL LOW (ref 8.9–10.3)
Chloride: 95 mmol/L — ABNORMAL LOW (ref 98–111)
Creatinine, Ser: 9.05 mg/dL — ABNORMAL HIGH (ref 0.44–1.00)
GFR, Estimated: 5 mL/min — ABNORMAL LOW (ref >60)
Glucose, Bld: 523 mg/dL (ref 70–99)
Potassium: 4.1 mmol/L (ref 3.5–5.1)
Sodium: 132 mmol/L — ABNORMAL LOW (ref 135–145)
Total Bilirubin: 0.6 mg/dL (ref 0.3–1.2)
Total Protein: 7.9 g/dL (ref 6.5–8.1)

## 2021-01-01 LAB — CBG MONITORING, ED: Glucose-Capillary: 306 mg/dL — ABNORMAL HIGH (ref 70–99)

## 2021-01-01 LAB — CBC
HCT: 28.9 % — ABNORMAL LOW (ref 36.0–46.0)
Hemoglobin: 9.6 g/dL — ABNORMAL LOW (ref 12.0–15.0)
MCH: 29.2 pg (ref 26.0–34.0)
MCHC: 33.2 g/dL (ref 30.0–36.0)
MCV: 87.8 fL (ref 80.0–100.0)
Platelets: 139 10*3/uL — ABNORMAL LOW (ref 150–400)
RBC: 3.29 MIL/uL — ABNORMAL LOW (ref 3.87–5.11)
RDW: 14.7 % (ref 11.5–15.5)
WBC: 6.5 10*3/uL (ref 4.0–10.5)
nRBC: 0 % (ref 0.0–0.2)

## 2021-01-01 LAB — RESP PANEL BY RT-PCR (FLU A&B, COVID) ARPGX2
Influenza A by PCR: NEGATIVE
Influenza B by PCR: NEGATIVE
SARS Coronavirus 2 by RT PCR: NEGATIVE

## 2021-01-01 LAB — LIPASE, BLOOD: Lipase: 82 U/L — ABNORMAL HIGH (ref 11–51)

## 2021-01-01 MED ORDER — INSULIN ASPART 100 UNIT/ML IJ SOLN
10.0000 [IU] | Freq: Once | INTRAMUSCULAR | Status: AC
  Administered 2021-01-01: 10 [IU] via SUBCUTANEOUS
  Filled 2021-01-01: qty 1

## 2021-01-01 MED ORDER — IOHEXOL 9 MG/ML PO SOLN
500.0000 mL | ORAL | Status: AC
  Administered 2021-01-01 (×2): 500 mL via ORAL

## 2021-01-01 NOTE — Discharge Instructions (Addendum)
Please take your insulin daily as directed by your doctor.  Make sure to go to dialysis on Thursday.  Return to the ER for worsening symptoms, persistent vomiting, difficulty breathing or other concerns.

## 2021-01-01 NOTE — ED Notes (Signed)
CBG checked-306; pt requesting and given warm blanket. No other needs voiced at this time.

## 2021-01-01 NOTE — ED Provider Notes (Signed)
[facility] Emergency Department Provider Note   ____________________________________________   Event Date/Time   First MD Initiated Contact with Patient 01/01/21 [phone]     (approximate)  I have reviewed the triage vital signs and the nursing notes.   HISTORY  Chief Complaint Abdominal Pain    HPI [name] is a 65 y.o. female who presents to the ED from home with a chief complaint of dysphagia and abdominal pain.  Patient has a history of CAD, CHF, ESRD on HD T/TH/SAT, diabetes, history of necrotizing fasciitis involving perineum status post multiple debridements and subsequent colostomy in January 2022.  Reports symptoms of dysphagia since January 2022.  States it feels difficult to swallow both liquids and solids.  Also complains of generalized abdominal pain.  Missed dialysis today secondary to generalized weakness.  Denies fever, cough, chest pain, shortness of breath, nausea, vomiting, diarrhea.  Patient is oliguric. Mentions that last week she had symptoms of body aches, cough and sore throat.  Admits to insulin noncompliance.      Past Medical History:  Diagnosis Date   Anemia    Aortic stenosis    Echo 8/18: mean 13, peak 28, LVOT/AV mean velocity 0.51   Arthritis    Asthma    As a child    Bronchitis    CAD (coronary artery disease)    a. 09/2016: 50% Ost 1st Mrg stenosis, 50% 2nd Mrg stenosis, 20% Mid-Cx, 95% Prox LAD, 40% mid-LAD, and 10% dist-LAD stenosis. Staged PCI with DES to Prox-LAD.    Chronic combined systolic and diastolic CHF (congestive heart failure) ([place]) 2011   echo 2/18: EF 55-60, normal wall motion, grade 2 diastolic dysfunction, trivial AI // echo 3/18: Septal and apical HK, EF 45-50, normal wall motion, trivial AI, mild LAE, PASP 38 // echo 8/18: EF 60-65, normal wall motion, grade 1 diastolic dysfunction, calcified aortic valve leaflets, mild aortic stenosis (mean 13, peak 28, LVOT/AV mean velocity 0.51), mild AI,  moderate MAC, mild LAE, trivial TR    Chronic kidney disease    STAGE 4   Chronic kidney disease on chronic dialysis (HCC)    t, th, sat   Complication of anesthesia    Depression    Diabetes mellitus Dx 1989   Elevated lipids    GERD (gastroesophageal reflux disease)    Gout    Heart murmur    asymptomatic   Hepatitis C Dx 2013   Hypertension Dx 1989   Infected surgical wound    Lt arm   Myocardial infarction ([place]) 07/2015   Obesity    Pancreatitis 2013   Pneumonia    Refusal of blood transfusions as patient is Jehovah's Witness    pt states she is not [place] witness and does not refuse blood products   Tendinitis    Tremors of nervous system    LEFT HAND   Ulcer 2010    Patient Active Problem List   Diagnosis Date Noted   Upper GI bleed 09/17/2020   Prolonged QT interval 09/17/2020   COPD (chronic obstructive pulmonary disease) ([place]) 09/17/2020   Acute pulmonary edema ([place]) 09/14/2020   Acute respiratory failure with hypoxia ([place]) 09/13/2020   Allergy, unspecified, initial encounter 03/29/2020   Anaphylactic shock, unspecified, initial encounter 03/29/2020   Volume overload 01/30/2020   Pain in left upper arm 01/18/2020   Cellulitis 01/16/2020   Anemia due to end stage renal disease ([place]) 01/16/2020   Diabetic polyneuropathy associated with type 2 diabetes mellitus (  [place]) 11/06/2019   Nystagmus 11/06/2019   Intermittent claudication ([place]) 11/06/2019   Vertigo of central origin 11/06/2019   Weakness    Hypervolemia associated with renal insufficiency 10/31/2019   BMI 45.0-49.9, adult ([place]) 10/18/2019   Ascending aortic aneurysm ([place]) 03/27/2019   Puncture wound without foreign body of left forearm, initial encounter 01/02/2019   Non-healing wound of upper extremity 12/28/2018   Unspecified open wound of left upper arm, initial encounter 26/83/4196   Complication from renal dialysis device 10/20/2018   Iron deficiency anemia, unspecified 10/18/2018   ESRD (end  stage renal disease) on dialysis ([place]) 06/01/2018   ARF (acute renal failure) ([place]) 05/09/2018   Colon cancer screening    LGSIL on Pap smear of cervix 01/27/2018   Gout 12/25/2017   AKI (acute kidney injury) ([place]) 12/24/2017   Hyperglycemia 12/24/2017   Chronic diastolic heart failure ([place]) 09/30/2017   Lymphedema 09/30/2017   Aortic stenosis    Acute pain of both knees 02/12/2017   Chronic gout due to renal impairment of multiple sites without tophus 02/12/2017   Esophageal dysphagia 02/12/2017   Osteoarthritis 01/22/2017   Diabetic hyperosmolar non-ketotic state ([place]) 12/13/2016   CAD (coronary artery disease) 12/13/2016   Hyperlipidemia 12/13/2016   Hyperphosphatemia 22/29/7989   Acute diastolic CHF (congestive heart failure) (HCC)    Hyperkalemia 08/21/2016   Asthma 11/29/2015   Depression 09/05/2015   GERD (gastroesophageal reflux disease) 03/25/2015   Environmental allergies 03/14/2015   SOB (shortness of breath) 02/15/2015   Chronic hepatitis C without hepatic coma ([place]) 01/31/2015   Diabetic neuropathy ([place]) 01/31/2015   OSA (obstructive sleep apnea) 01/01/2015   Poor dentition 11/13/2014   Essential hypertension 10/08/2014   Morbid (severe) obesity due to excess calories ([place]) 10/08/2014   Localized, primary osteoarthritis 01/25/2014   Family history of diabetes mellitus 03/31/2013   Pain in limb 03/31/2013   Uncontrolled type 2 diabetes mellitus with hyperosmolar nonketotic hyperglycemia ([place]) 03/25/2012    Class: Chronic    Past Surgical History:  Procedure Laterality Date   A/V FISTULAGRAM Left 04/11/2019   Procedure: A/V FISTULAGRAM;  Surgeon: Katha Cabal, MD;  Location: [place] [facility];  Service: Cardiovascular;  Laterality: Left;   A/V FISTULAGRAM Left 06/02/2019   Procedure: A/V FISTULAGRAM;  Surgeon: Katha Cabal, MD;  Location: [place] [facility];  Service: Cardiovascular;  Laterality: Left;   APPLICATION OF WOUND VAC Left 08/14/2017    Procedure: APPLICATION OF WOUND VAC Exchange;  Surgeon: Robert Bellow, MD;  Location: [facility];  Service: General;  Laterality: Left;   APPLICATION OF WOUND VAC Left 12/21/2018   Procedure: APPLICATION OF WOUND VAC;  Surgeon: Katha Cabal, MD;  Location: [facility];  Service: Vascular;  Laterality: Left;   AV FISTULA PLACEMENT Left 08/19/2018   Procedure: ARTERIOVENOUS (AV) FISTULA CREATION ( BRACHIOBASILIC );  Surgeon: Katha Cabal, MD;  Location: [facility];  Service: Vascular;  Laterality: Left;   BASCILIC VEIN TRANSPOSITION Left 11/18/2018   Procedure: BASCILIC VEIN TRANSPOSITION;  Surgeon: Katha Cabal, MD;  Location: [facility];  Service: Vascular;  Laterality: Left;   BIOPSY  09/20/2020   Procedure: BIOPSY;  Surgeon: Carol Ada, MD;  Location: [place];  Service: Endoscopy;;   CHOLECYSTECTOMY     COLONOSCOPY WITH PROPOFOL N/A 02/03/2018   Procedure: COLONOSCOPY WITH PROPOFOL;  Surgeon: Lin Landsman, MD;  Location: [facility];  Service: Gastroenterology;  Laterality: N/A;   CORONARY ANGIOPLASTY  07/2015   STENT   CORONARY STENT  INTERVENTION N/A 09/18/2016   Procedure: Coronary Stent Intervention;  Surgeon: Troy Sine, MD;  Location: [place] [facility];  Service: Cardiovascular;  Laterality: N/A;   DIALYSIS/PERMA CATHETER INSERTION N/A 05/10/2018   Procedure: DIALYSIS/PERMA CATHETER INSERTION;  Surgeon: Katha Cabal, MD;  Location: [place] [facility];  Service: Cardiovascular;  Laterality: N/A;   DRESSING CHANGE UNDER ANESTHESIA Left 08/15/2017   Procedure: exploration of wound for bleeding;  Surgeon: Robert Bellow, MD;  Location: [facility];  Service: General;  Laterality: Left;   ESOPHAGOGASTRODUODENOSCOPY (EGD) WITH PROPOFOL N/A 02/03/2018   Procedure: ESOPHAGOGASTRODUODENOSCOPY (EGD) WITH PROPOFOL;  Surgeon: Lin Landsman, MD;  Location: [facility];  Service: Gastroenterology;  Laterality: N/A;   ESOPHAGOGASTRODUODENOSCOPY (EGD) WITH  PROPOFOL N/A 09/20/2020   Procedure: ESOPHAGOGASTRODUODENOSCOPY (EGD) WITH PROPOFOL;  Surgeon: Carol Ada, MD;  Location: [place];  Service: Endoscopy;  Laterality: N/A;   EYE SURGERY  11/17/2018   INCISION AND DRAINAGE ABSCESS Left 08/12/2017   Procedure: INCISION AND DRAINAGE ABSCESS;  Surgeon: Robert Bellow, MD;  Location: [facility];  Service: General;  Laterality: Left;   KNEE ARTHROSCOPY     LEFT HEART CATH N/A 09/18/2016   Procedure: Left Heart Cath;  Surgeon: Troy Sine, MD;  Location: [place] [facility];  Service: Cardiovascular;  Laterality: N/A;   LEFT HEART CATH AND CORONARY ANGIOGRAPHY N/A 09/16/2016   Procedure: Left Heart Cath and Coronary Angiography;  Surgeon: Burnell Blanks, MD;  Location: [place] [facility];  Service: Cardiovascular;  Laterality: N/A;   LEFT HEART CATH AND CORONARY ANGIOGRAPHY N/A 04/29/2017   Procedure: LEFT HEART CATH AND CORONARY ANGIOGRAPHY;  Surgeon: Nelva Bush, MD;  Location: [place] [facility];  Service: Cardiovascular;  Laterality: N/A;   LOWER EXTREMITY ANGIOGRAPHY Right 03/08/2018   Procedure: LOWER EXTREMITY ANGIOGRAPHY;  Surgeon: Katha Cabal, MD;  Location: [place] [facility];  Service: Cardiovascular;  Laterality: Right;   TUBAL LIGATION     TUBAL LIGATION     UPPER EXTREMITY ANGIOGRAPHY Right 09/19/2019   Procedure: UPPER EXTREMITY ANGIOGRAPHY;  Surgeon: Katha Cabal, MD;  Location: [place] [facility];  Service: Cardiovascular;  Laterality: Right;   WOUND DEBRIDEMENT Left 12/21/2018   Procedure: DEBRIDEMENT WOUND;  Surgeon: Katha Cabal, MD;  Location: [facility];  Service: Vascular;  Laterality: Left;   WOUND DEBRIDEMENT Left 12/30/2018   Procedure: DEBRIDEMENT WOUND WITH VAC PLACEMENT (LEFT UPPER EXTREMITY);  Surgeon: Katha Cabal, MD;  Location: [facility];  Service: Vascular;  Laterality: Left;    Prior to Admission medications   Medication Sig Start Date End Date Taking? Authorizing Provider   albuterol (VENTOLIN HFA) 108 (90 Base) MCG/ACT inhaler Inhale 2 puffs into the lungs every 4 (four) hours as needed for wheezing or shortness of breath. 06/28/20   [name], PA-C  allopurinol (ZYLOPRIM) 100 MG tablet TAKE 1 TABLET BY MOUTH ONCE DAILY. 11/05/20   Birdie Sons, MD  aspirin 81 MG EC tablet Take 1 tablet (81 mg total) by mouth daily. 09/20/20   Thurnell Lose, MD  atorvastatin (LIPITOR) 80 MG tablet TAKE 1 TABLET BY MOUTH ONCE DAILY AT 6PM Patient taking differently: Take 80 mg by mouth every evening. 07/08/20   [name], PA-C  AURYXIA 1 GM 210 MG(Fe) tablet Take 2 tablets (420 mg total) by mouth 3 (three) times daily with meals. 05/03/20   [name], PA-C  B Complex-C-Folic Acid (RENA-VITE RX PO) Take 1 mg by mouth  daily.    [provider]  B Complex-C-Folic Acid (RENA-VITE RX) 1 MG TABS Take 1 mg by mouth daily. 10/15/20   [provider]  buPROPion (WELLBUTRIN SR) 150 MG 12 hr tablet Take 1 tablet (150 mg total) by mouth 2 (two) times daily. 05/03/20   [name], PA-C  carvedilol (COREG) 25 MG tablet Take 1 tablet (25 mg total) by mouth 2 (two) times daily with a meal. 10/29/20   Bacigalupo, Dionne Bucy, MD  Cinacalcet HCl (SENSIPAR PO) Take 30 mg by mouth every dialysis. 01/04/20 01/02/21  [provider]  colchicine 0.6 MG tablet Take 1 tablet (0.6 mg total) by mouth once a week for 15 days. 09/29/20 10/14/20  Thurnell Lose, MD  Continuous Blood Gluc Receiver (FREESTYLE LIBRE 14 DAY READER) DEVI To check blood sugar ACHS 09/27/20   [name], PA-C  Continuous Blood Gluc Sensor (FREESTYLE LIBRE 14 DAY SENSOR) MISC To check blood sugar ACHS; change every 14 days 09/27/20   [name], PA-C  cyclobenzaprine (FLEXERIL) 10 MG tablet Take 1 tablet (10 mg total) by mouth 2 (two) times daily as needed for muscle spasms. 10/24/20   Corena Herter, PA-C  diclofenac Sodium (VOLTAREN) 1 % GEL Apply 2 g  topically 4 (four) times daily. 10/24/20   Corena Herter, PA-C  dicyclomine (BENTYL) 20 MG tablet Take 1 tablet (20 mg total) by mouth in the morning and at bedtime. 09/27/20   [name], PA-C  docusate sodium (COLACE) 100 MG capsule Take 1 capsule (100 mg total) by mouth 2 (two) times daily as needed for mild constipation. 09/20/17   Gouru, Illene Silver, MD  doxepin (SINEQUAN) 10 MG capsule TAKE 1 CAPSULE(10 MG) BY MOUTH FOUR TIMES DAILY AS NEEDED FOR ITCHING Patient taking differently: Take 10 mg by mouth 2 (two) times daily. 05/03/20   [name], PA-C  fluticasone (FLONASE) 50 MCG/ACT nasal spray Place 2 sprays into both nostrils daily as needed for allergies or rhinitis. 09/28/17   [name], PA-C  fluticasone furoate-vilanterol (BREO ELLIPTA) 200-25 MCG/INH AEPB Inhale 1 puff into the lungs daily. 08/30/20   [name], PA-C  hydrALAZINE (APRESOLINE) 100 MG tablet Take 100 mg by mouth 3 (three) times daily. 10/07/20   [provider]  hydrOXYzine (ATARAX/VISTARIL) 25 MG tablet Take 1 tablet (25 mg total) by mouth 4 (four) times daily. 12/03/20   Bacigalupo, Dionne Bucy, MD  insulin detemir (LEVEMIR FLEXTOUCH) 100 UNIT/ML FlexPen Inject 60 Units into the skin 2 (two) times daily. 05/24/20   [name], PA-C  insulin glargine (LANTUS) 100 unit/mL SOPN Inject 25 Units into the skin 2 (two) times daily.    [provider]  insulin lispro (HUMALOG KWIKPEN) 100 UNIT/ML KwikPen Inject 25 Units into the skin with breakfast, with lunch, and with evening meal. Patient taking differently: Inject 7 Units into the skin 2 (two) times daily. 05/14/20   [name], PA-C  lidocaine-prilocaine (EMLA) cream Apply 1 application topically as needed. Patient taking differently: Apply 1 application topically as needed (dialysis days). 03/27/19   Schnier, Dolores Lory, MD  loratadine (CLARITIN) 10 MG tablet TAKE 1 TABLET BY MOUTH ONCE DAILY. Patient taking  differently: Take 10 mg by mouth daily as needed for allergies. 07/15/20   [name], PA-C  montelukast (SINGULAIR) 10 MG tablet TAKE ONE TABLET BY MOUTH AT BEDTIME. Patient taking differently: Take 10 mg by mouth at bedtime as needed (allergies). 07/15/20  [name] M, PA-C  nitroGLYCERIN (NITROSTAT) 0.4 MG SL tablet Place 1 tablet (0.4 mg total) under the tongue every 5 (five) minutes x 3 doses as needed for chest pain. 05/03/20   [name], PA-C  Nutritional Supplements (FEEDING SUPPLEMENT, NEPRO CARB STEADY,) LIQD Take 237 mLs by mouth daily. 02/01/20   Loletha Grayer, MD  nystatin cream (MYCOSTATIN) Apply 1 application topically 2 (two) times daily. Patient taking differently: Apply 1 application topically daily as needed for dry skin. 08/10/19   [name], PA-C  omeprazole (PRILOSEC) 20 MG capsule Take 20 mg by mouth daily. 10/07/20   [provider]  oxyCODONE (ROXICODONE) 5 MG immediate release tablet Take 1 tablet (5 mg total) by mouth every 12 (twelve) hours as needed for severe pain. 08/30/20   [name], PA-C  oxyCODONE-acetaminophen (PERCOCET/ROXICET) 5-325 MG tablet Take 1 tablet by mouth 4 (four) times daily as needed for pain. 10/23/20   [provider]  pantoprazole (PROTONIX) 40 MG tablet Take 1 tablet (40 mg total) by mouth 2 (two) times daily. 09/24/20   Thurnell Lose, MD  pregabalin (LYRICA) 150 MG capsule Take 1 capsule (150 mg total) by mouth 2 (two) times daily. 09/27/20   [name], PA-C  SENNA-PLUS 8.6-50 MG tablet TAKE ONE TABLET BY MOUTH AT BEDTIME. 07/15/20   [name], PA-C  sevelamer carbonate (RENVELA) 800 MG tablet Take 800 mg by mouth 3 (three) times daily. 08/21/20 08/21/21  [provider]  sucralfate (CARAFATE) 1 g tablet Take 1 tablet (1 g total) by mouth 2 (two) times daily for 26 days. 09/24/20 10/20/20  Thurnell Lose, MD  torsemide (DEMADEX) 20 MG tablet TAKE (2)  TABLETS BY MOUTH TWICE DAILY. 12/03/20   Virginia Crews, MD  triamcinolone cream (KENALOG) 0.1 % APPLY TO AFFECTED AREA TWICE DAILY Patient taking differently: Apply 1 application topically 2 (two) times daily as needed (rash). 06/17/18   [name], PA-C  valACYclovir (VALTREX) 500 MG tablet TAKE (1) TABLET BY MOUTH EVERY OTHER DAY. 12/03/20   Bacigalupo, Dionne Bucy, MD  VICTOZA 18 MG/3ML SOPN Start with 0.85m daily and increase by 0.686mper week until max dose of 1.8 mg dose achieved. Patient taking differently: Inject into the skin every Wednesday. 03/25/20   BuMar DaringPA-C  Vitamin D, Ergocalciferol, (DRISDOL) 1.25 MG (50000 UNIT) CAPS capsule TAKE 1 CAPSULE BY MOUTH ONCE A MONTH 11/04/20   FiBirdie SonsMD  gabapentin (NEURONTIN) 100 MG capsule Take 1 capsule (100 mg total) by mouth 3 (three) times daily. 08/19/17 02/03/19  PaFritzi MandesMD    Allergies Morphine, Shellfish allergy, and Diazepam  Family History  Problem Relation Age of Onset   Colon cancer Mother    Heart attack Other    Heart attack Maternal Grandmother    Hypertension Sister    Hypertension Brother    Diabetes Paternal Grandmother    Breast cancer Neg Hx     Social History Social History   Tobacco Use   Smoking status: Former    Packs/day: 0.25    Years: 6.00    Pack years: 1.50    Types: Cigarettes    Quit date: 10/25/1980    Years since quitting: 40.2   Smokeless tobacco: Never  Vaping Use   Vaping Use: Never used  Substance Use Topics   Alcohol use: Yes    Comment: rare   Drug use: Not Currently    Types: Marijuana  Review of Systems  Constitutional: No fever/chills Eyes: No visual changes. ENT: No sore throat. Cardiovascular: Denies chest pain. Respiratory: Denies shortness of breath. Gastrointestinal: Positive for dysphagia.  Positive for abdominal pain.  No nausea, no vomiting.  No diarrhea.  No constipation. Genitourinary: Negative for dysuria. Musculoskeletal:  Negative for back pain. Skin: Negative for rash. Neurological: Negative for headaches, focal weakness or numbness.   ____________________________________________   PHYSICAL EXAM:  VITAL SIGNS: ED Triage Vitals  Enc Vitals Group     BP 12/31/20 2331 (!) 188/87     Pulse Rate 12/31/20 2329 71     Resp 12/31/20 2329 18     Temp 12/31/20 2329 97.8 F (36.6 C)     Temp Source 12/31/20 2329 Oral     SpO2 12/31/20 2331 100 %     Weight --      Height --      Head Circumference --      Peak Flow --      Pain Score 12/31/20 2330 8     Pain Loc --      Pain Edu? --      Excl. in [place]? --     Constitutional: Alert and oriented.  Chronically ill but well appearing and in no acute distress. Eyes: Conjunctivae are normal. PERRL. EOMI. Head: Atraumatic. Nose: No congestion/rhinnorhea. Mouth/Throat: Mucous membranes are moist.   Neck: No stridor.   Cardiovascular: Normal rate, regular rhythm. II/VI SEM.  Good peripheral circulation. Respiratory: Normal respiratory effort.  No retractions. Lungs CTAB. Gastrointestinal: Soft with mild diffuse tenderness to palpation without rebound or guarding. + Colostomy. No distention. No abdominal bruits. No CVA tenderness. Musculoskeletal: No lower extremity tenderness nor edema.  No joint effusions. Neurologic:  Normal speech and language. No gross focal neurologic deficits are appreciated.  Skin:  Skin is warm, dry and intact. No rash noted. Psychiatric: Mood and affect are normal. Speech and behavior are normal.  ____________________________________________   LABS (all labs ordered are listed, but only abnormal results are displayed)  Labs Reviewed  LIPASE, BLOOD - Abnormal; Notable for the following components:      Result Value   Lipase 82 (*)    All other components within normal limits  COMPREHENSIVE METABOLIC PANEL - Abnormal; Notable for the following components:   Sodium 132 (*)    Chloride 95 (*)    Glucose, Bld 523 (*)    BUN 62  (*)    Creatinine, Ser 9.05 (*)    Calcium 7.9 (*)    Albumin 3.3 (*)    Alkaline Phosphatase 430 (*)    GFR, Estimated 5 (*)    All other components within normal limits  CBC - Abnormal; Notable for the following components:   RBC 3.29 (*)    Hemoglobin 9.6 (*)    HCT 28.9 (*)    Platelets 139 (*)    All other components within normal limits  CBG MONITORING, ED - Abnormal; Notable for the following components:   Glucose-Capillary 306 (*)    All other components within normal limits  RESP PANEL BY RT-PCR (FLU A&B, COVID) [id number]  URINALYSIS, COMPLETE (UACMP) WITH MICROSCOPIC   ____________________________________________  EKG  ED ECG REPORT I, Phynix Horton J, the attending physician, personally viewed and interpreted this ECG.   Date: 01/01/2021  EKG Time: 0138  Rate: 71  Rhythm: normal EKG, normal sinus rhythm  Axis: Normal  Intervals:none  ST&T Change: Nonspecific  ____________________________________________  RADIOLOGY Cecilie Kicks J, personally viewed and evaluated  these images (plain radiographs) as part of my medical decision making, as well as reviewing the written report by the radiologist.  ED MD interpretation: Pulmonary vascular congestion without overt edema; unremarkable CT abdomen/pelvis for acute findings  Official radiology report(s): CT Abdomen Pelvis Wo Contrast  Result Date: 01/01/2021 CLINICAL DATA:  Acute nonlocalized abdominal pain EXAM: CT ABDOMEN AND PELVIS WITHOUT CONTRAST TECHNIQUE: Multidetector CT imaging of the abdomen and pelvis was performed following the standard protocol without IV contrast. COMPARISON:  12/13/2019 FINDINGS: Lower chest: Aortic and coronary calcification. Bilateral lower lobe scarring. Hepatobiliary: Lobulated liver surface. No focal hepatic abnormality.Cholecystectomy. No bile duct dilatation. Pancreas: Unremarkable. Spleen: Unremarkable. Adrenals/Urinary Tract: Negative adrenals. No hydronephrosis or stone. Left renal cystic  densities. Unremarkable bladder. Stomach/Bowel: Transverse colostomy. No obstruction. No visible inflammation. Vascular/Lymphatic: No acute vascular abnormality. Atheromatous calcification of the aorta. No mass or adenopathy. Reproductive:Band of subcutaneous opacity with skin cleft over the right groin and mons pubis which has a scar-like appearance. Other: No ascites or pneumoperitoneum. Musculoskeletal: No acute abnormalities. Left rectus femoris lipoma with simple appearance. Diffuse thoracic disc space narrowing and endplate degeneration. Generalized lumbar spine degeneration. IMPRESSION: 1. No acute finding.  No bowel obstruction or visible inflammation. 2. Lobulated liver, please correlate for cirrhosis risk factors. 3. Bilateral lower lobe scarring. 4. Scar-like appearance to the right groin/mons pubis since 2021, please correlate for interval surgery. Electronically Signed   By: Monte Fantasia M.D.   On: 01/01/2021 05:16   DG Chest Port 1 View  Result Date: 01/01/2021 CLINICAL DATA:  In stage renal disease. Difficulty swallowing food. Shortness of breath and left side abdominal pain EXAM: PORTABLE CHEST 1 VIEW COMPARISON:  09/17/20 FINDINGS: Stable cardiomediastinal contours. The pulmonary vascular congestion without frank edema. Platelike atelectasis versus scar noted within the right base. No airspace consolidation. Visualized osseous structures appear intact. IMPRESSION: 1. Pulmonary vascular congestion. 2. Right base atelectasis versus scar. Electronically Signed   By: Kerby Moors M.D.   On: 01/01/2021 02:24    ____________________________________________   PROCEDURES  Procedure(s) performed (including Critical Care):  .1-3 Lead EKG Interpretation  Date/Time: 01/01/2021 6:11 AM Performed by: Paulette Blanch, MD Authorized by: Paulette Blanch, MD     Interpretation: normal     ECG rate:  70   ECG rate assessment: normal     Rhythm: sinus rhythm     Ectopy: none     Conduction: normal    Comments:     Patient placed on cardiac monitor to monitor for arrthymias   ____________________________________________   INITIAL IMPRESSION / ASSESSMENT AND PLAN / ED COURSE  As part of my medical decision making, I reviewed the following data within the [place] History obtained from family, Nursing notes reviewed and incorporated, Labs reviewed, EKG interpreted, Old chart reviewed, Radiograph reviewed, and Notes from prior ED visits     65 year old female presenting with dysphagia and generalized abdominal pain. Differential diagnosis includes, but is not limited to, ovarian cyst, ovarian torsion, acute appendicitis, diverticulitis, urinary tract infection/pyelonephritis, endometriosis, bowel obstruction, colitis, renal colic, gastroenteritis, hernia, fibroids, endometriosis, etc.   Laboratory results demonstrate hyperglycemia without elevation of anion gap, mildly elevated lipase. Will obtain CT abd/pel, administer insulin and reassess.  Clinical Course as of 01/01/21 0612  Wed Jan 01, 2021  0325 Patient drinking oral contrast in no acute distress.  Reading content on her iPad. [JS]  [id number] Awaken patient from sleep to update her on CT results.  Encouraged her to  take her insulin daily as directed by her doctor.  Also encouraged her to go to dialysis on Thursday.  Strict return precautions given.  Patient verbalizes understanding agrees with plan of care. [JS]    Clinical Course User Index [JS] Paulette Blanch, MD     ____________________________________________   FINAL CLINICAL IMPRESSION(S) / ED DIAGNOSES  Final diagnoses:  Generalized abdominal pain  Hyperglycemia  Dysphagia, unspecified type     ED Discharge Orders     None        Note:  This document was prepared using Dragon voice recognition software and may include unintentional dictation errors.    Paulette Blanch, MD 01/01/21 [phone]

## 2021-01-09 ENCOUNTER — Inpatient Hospital Stay: Admission: RE | Admit: 2021-01-09 | Payer: Medicare Other | Source: Ambulatory Visit

## 2021-01-21 ENCOUNTER — Other Ambulatory Visit: Payer: Self-pay | Admitting: Physician Assistant

## 2021-01-21 DIAGNOSIS — IMO0002 Reserved for concepts with insufficient information to code with codable children: Secondary | ICD-10-CM

## 2021-01-21 DIAGNOSIS — E1165 Type 2 diabetes mellitus with hyperglycemia: Secondary | ICD-10-CM

## 2021-01-24 ENCOUNTER — Other Ambulatory Visit: Payer: Self-pay | Admitting: Family Medicine

## 2021-01-24 DIAGNOSIS — I5032 Chronic diastolic (congestive) heart failure: Secondary | ICD-10-CM

## 2021-01-24 DIAGNOSIS — A6000 Herpesviral infection of urogenital system, unspecified: Secondary | ICD-10-CM

## 2021-01-28 ENCOUNTER — Telehealth: Payer: Self-pay

## 2021-01-28 NOTE — Progress Notes (Signed)
   Sent message to scheduler to reach out to patient to reschedule her a initial visit due to her no show  initial visit that was schedule on 11/29/2020 and 10/04/2020.  [place] Pharmacist Assistant [phone]

## 2021-01-29 ENCOUNTER — Telehealth: Payer: Self-pay | Admitting: *Deleted

## 2021-01-29 NOTE — Chronic Care Management (AMB) (Signed)
  Care Management   Note  01/29/2021 Name: [name] MRN: [id number] DOB: 05-11-1956  [name] is a 65 y.o. year old female who is a primary care patient of Rubye Beach and is actively engaged with the care management team. I reached out to [name] by phone today to assist with re-scheduling an initial visit with the Pharmacist  Follow up plan: Unsuccessful telephone outreach attempt made. A HIPAA compliant phone message was left for the patient providing contact information and requesting a return call. The care management team will reach out to the patient again over the next 7 days. If patient returns call to provider office, please advise to call [place] at [phone].  [place] Management  Direct Dial: [phone]

## 2021-01-31 NOTE — Chronic Care Management (AMB) (Signed)
  Care Management   Note  01/31/2021 Name: [name] MRN: [id number] DOB: 07-Jan-1956  [name] is a 65 y.o. year old female who is a primary care patient of Rubye Beach and is actively engaged with the care management team. I reached out to [name] by phone today to assist with re-scheduling an initial visit with the Pharmacist  Follow up plan: Unsuccessful telephone outreach attempt made. A HIPAA compliant phone message was left for the patient providing contact information and requesting a return call.  The care management team will reach out to the patient again over the next 7 days.  If patient returns call to provider office, please advise to call [place] at [phone].  [place] Management  Direct Dial: [phone]

## 2021-02-05 ENCOUNTER — Ambulatory Visit: Payer: Medicare Other | Admitting: Family Medicine

## 2021-02-05 NOTE — Progress Notes (Deleted)
Established patient visit   Patient: [name]   DOB: 1956/01/20   65 y.o. Female  MRN: [id number] Visit Date: 02/05/2021  Today's healthcare provider: Wilhemena Durie, MD   No chief complaint on file.  Subjective    HPI  ***  {Show patient history (optional):23778}   Medications: Outpatient Medications Prior to Visit  Medication Sig   albuterol (VENTOLIN HFA) 108 (90 Base) MCG/ACT inhaler Inhale 2 puffs into the lungs every 4 (four) hours as needed for wheezing or shortness of breath.   allopurinol (ZYLOPRIM) 100 MG tablet TAKE 1 TABLET BY MOUTH ONCE DAILY.   aspirin 81 MG EC tablet Take 1 tablet (81 mg total) by mouth daily.   atorvastatin (LIPITOR) 80 MG tablet TAKE 1 TABLET BY MOUTH ONCE DAILY AT 6PM (Patient taking differently: Take 80 mg by mouth every evening.)   AURYXIA 1 GM 210 MG(Fe) tablet Take 2 tablets (420 mg total) by mouth 3 (three) times daily with meals.   B Complex-C-Folic Acid (RENA-VITE RX PO) Take 1 mg by mouth daily.   B Complex-C-Folic Acid (RENA-VITE RX) 1 MG TABS Take 1 mg by mouth daily.   buPROPion (WELLBUTRIN SR) 150 MG 12 hr tablet Take 1 tablet (150 mg total) by mouth 2 (two) times daily.   carvedilol (COREG) 25 MG tablet Take 1 tablet (25 mg total) by mouth 2 (two) times daily with a meal.   colchicine 0.6 MG tablet Take 1 tablet (0.6 mg total) by mouth once a week for 15 days.   Continuous Blood Gluc Receiver (FREESTYLE LIBRE 14 DAY READER) DEVI To check blood sugar ACHS   Continuous Blood Gluc Sensor (FREESTYLE LIBRE 14 DAY SENSOR) MISC To check blood sugar ACHS; change every 14 days   cyclobenzaprine (FLEXERIL) 10 MG tablet Take 1 tablet (10 mg total) by mouth 2 (two) times daily as needed for muscle spasms.   diclofenac Sodium (VOLTAREN) 1 % GEL Apply 2 g topically 4 (four) times daily.   dicyclomine (BENTYL) 20 MG tablet Take 1 tablet (20 mg total) by mouth in the morning and at bedtime.   docusate sodium (COLACE) 100 MG capsule  Take 1 capsule (100 mg total) by mouth 2 (two) times daily as needed for mild constipation.   doxepin (SINEQUAN) 10 MG capsule TAKE 1 CAPSULE(10 MG) BY MOUTH FOUR TIMES DAILY AS NEEDED FOR ITCHING (Patient taking differently: Take 10 mg by mouth 2 (two) times daily.)   fluticasone (FLONASE) 50 MCG/ACT nasal spray Place 2 sprays into both nostrils daily as needed for allergies or rhinitis.   fluticasone furoate-vilanterol (BREO ELLIPTA) 200-25 MCG/INH AEPB Inhale 1 puff into the lungs daily.   hydrALAZINE (APRESOLINE) 100 MG tablet Take 100 mg by mouth 3 (three) times daily.   hydrOXYzine (ATARAX/VISTARIL) 25 MG tablet Take 1 tablet (25 mg total) by mouth 4 (four) times daily.   insulin detemir (LEVEMIR FLEXTOUCH) 100 UNIT/ML FlexPen Inject 60 Units into the skin 2 (two) times daily.   insulin glargine (LANTUS) 100 unit/mL SOPN Inject 25 Units into the skin 2 (two) times daily.   insulin lispro (HUMALOG KWIKPEN) 100 UNIT/ML KwikPen Inject 25 Units into the skin with breakfast, with lunch, and with evening meal. (Patient taking differently: Inject 7 Units into the skin 2 (two) times daily.)   lidocaine-prilocaine (EMLA) cream Apply 1 application topically as needed. (Patient taking differently: Apply 1 application topically as needed (dialysis days).)   loratadine (CLARITIN) 10 MG tablet TAKE 1  TABLET BY MOUTH ONCE DAILY. (Patient taking differently: Take 10 mg by mouth daily as needed for allergies.)   montelukast (SINGULAIR) 10 MG tablet TAKE ONE TABLET BY MOUTH AT BEDTIME. (Patient taking differently: Take 10 mg by mouth at bedtime as needed (allergies).)   nitroGLYCERIN (NITROSTAT) 0.4 MG SL tablet Place 1 tablet (0.4 mg total) under the tongue every 5 (five) minutes x 3 doses as needed for chest pain.   Nutritional Supplements (FEEDING SUPPLEMENT, NEPRO CARB STEADY,) LIQD Take 237 mLs by mouth daily.   nystatin cream (MYCOSTATIN) Apply 1 application topically 2 (two) times daily. (Patient taking  differently: Apply 1 application topically daily as needed for dry skin.)   omeprazole (PRILOSEC) 20 MG capsule Take 20 mg by mouth daily.   oxyCODONE (ROXICODONE) 5 MG immediate release tablet Take 1 tablet (5 mg total) by mouth every 12 (twelve) hours as needed for severe pain.   oxyCODONE-acetaminophen (PERCOCET/ROXICET) 5-325 MG tablet Take 1 tablet by mouth 4 (four) times daily as needed for pain.   pantoprazole (PROTONIX) 40 MG tablet Take 1 tablet (40 mg total) by mouth 2 (two) times daily.   pregabalin (LYRICA) 150 MG capsule Take 1 capsule (150 mg total) by mouth 2 (two) times daily.   SENNA-PLUS 8.6-50 MG tablet TAKE ONE TABLET BY MOUTH AT BEDTIME.   sevelamer carbonate (RENVELA) 800 MG tablet Take 800 mg by mouth 3 (three) times daily.   sucralfate (CARAFATE) 1 g tablet Take 1 tablet (1 g total) by mouth 2 (two) times daily for 26 days.   torsemide (DEMADEX) 20 MG tablet TAKE (2) TABLETS BY MOUTH TWICE DAILY.   triamcinolone cream (KENALOG) 0.1 % APPLY TO AFFECTED AREA TWICE DAILY (Patient taking differently: Apply 1 application topically 2 (two) times daily as needed (rash).)   ULTICARE SHORT PEN NEEDLES 31G X 8 MM MISC USE THREE TIMES DAILY WITH INSULIN   valACYclovir (VALTREX) 500 MG tablet TAKE (1) TABLET BY MOUTH EVERY OTHER DAY.   VICTOZA 18 MG/3ML SOPN Start with 0.6mg  daily and increase by 0.6mg  per week until max dose of 1.8 mg dose achieved. (Patient taking differently: Inject into the skin every Wednesday.)   Vitamin D, Ergocalciferol, (DRISDOL) 1.25 MG (50000 UNIT) CAPS capsule TAKE 1 CAPSULE BY MOUTH ONCE A MONTH   No facility-administered medications prior to visit.    Review of Systems  {Labs  Heme  Chem  Endocrine  Serology  Results Review (optional):23779}   Objective    There were no vitals taken for this visit. {Show previous vital signs (optional):23777}   Physical Exam  ***  No results found for any visits on 02/05/21.  Assessment & Plan      ***  No follow-ups on file.      {provider attestation***:1}   Wilhemena Durie, MD  [facility] [phone] (phone) [phone] (fax)  [place]

## 2021-02-10 NOTE — Chronic Care Management (AMB) (Signed)
  Care Management   Note  02/10/2021 Name: [name] MRN: [id number] DOB: [birth year]  [name] is a 65 y.o. year old female who is a primary care patient of Rubye Beach and is actively engaged with the care management team. I reached out to [name] by phone today to assist with re-scheduling an initial visit with the Pharmacist  Follow up plan: Telephone appointment with care management team member scheduled for:03/24/2021  Gladyes Kudo  Care Guide, [place] [facility]  Direct Dial: [phone]

## 2021-02-18 ENCOUNTER — Other Ambulatory Visit: Payer: Self-pay | Admitting: Family Medicine

## 2021-02-18 ENCOUNTER — Other Ambulatory Visit: Payer: Self-pay | Admitting: Physician Assistant

## 2021-02-18 DIAGNOSIS — Z1231 Encounter for screening mammogram for malignant neoplasm of breast: Secondary | ICD-10-CM

## 2021-02-18 DIAGNOSIS — E1122 Type 2 diabetes mellitus with diabetic chronic kidney disease: Secondary | ICD-10-CM

## 2021-02-18 DIAGNOSIS — Z794 Long term (current) use of insulin: Secondary | ICD-10-CM

## 2021-02-21 ENCOUNTER — Ambulatory Visit: Payer: Self-pay

## 2021-02-21 NOTE — Telephone Encounter (Signed)
Called patient to review issues with knee pain. Patient reports knee pain for the last month and she is having pain when walking on right leg. Swelling reported denies  chest pain , difficulty breathing , no fever. Patient reports she is not taking anything for pain. Encouraged patient to contact orthopedic dr seen in the past to evaluate knee pain and swelling if injection is what she is requesting. Patient reports she will call ortho Monday. Care advise given.. Patient verbalized understanding of care advise and to call back or go to [facility] or ED if symptoms worsen.   Reason for Disposition  [1] MILD pain (e.g., does not interfere with normal activities) AND [2] present > 7 days  Answer Assessment - Initial Assessment Questions 1. LOCATION and RADIATION: "Where is the pain located?"      Right knee pain  2. QUALITY: "What does the pain feel like?"  (e.g., sharp, dull, aching, burning)     Dull pain  3. SEVERITY: "How bad is the pain?" "What does it keep you from doing?"   (Scale 1-10; or mild, moderate, severe)   -  MILD (1-3): doesn't interfere with normal activities    -  MODERATE (4-7): interferes with normal activities (e.g., work or school) or awakens from sleep, limping    -  SEVERE (8-10): excruciating pain, unable to do any normal activities, unable to walk     Moderate  4. ONSET: "When did the pain start?" "Does it come and go, or is it there all the time?"     Comes and goes  5. RECURRENT: "Have you had this pain before?" If Yes, ask: "When, and what happened then?"     Yes . Shots injected 6. SETTING: "Has there been any recent work, exercise or other activity that involved that part of the body?"      no 7. AGGRAVATING FACTORS: "What makes the knee pain worse?" (e.g., walking, climbing stairs, running)     Walking  8. ASSOCIATED SYMPTOMS: "Is there any swelling or redness of the knee?"     Swelling no redness  9. OTHER SYMPTOMS: "Do you have any other symptoms?" (e.g., chest pain,  difficulty breathing, fever, calf pain)     Denies  10. PREGNANCY: "Is there any chance you are pregnant?" "When was your last menstrual period?"       na  Protocols used: Knee Pain-A-AH

## 2021-02-21 NOTE — Telephone Encounter (Signed)
Patient called, left VM to return the call to the office to discuss symptoms with a nurse.   Summary: knee pain   Patient would like to be contacted regarding their knee pain   The patient shares that they've previously sought treatment for their knee discomfort   The patient shares that their left knee has responded well to shots that they've been administered for pain but their right continues to bother them   Please contact further when possible

## 2021-02-26 ENCOUNTER — Ambulatory Visit: Payer: Medicare Other

## 2021-02-28 ENCOUNTER — Telehealth: Payer: Self-pay | Admitting: Family Medicine

## 2021-02-28 NOTE — Telephone Encounter (Signed)
Dr Arvin Collard office ([facility]) called to ask if pt had been seen.  Pt was in their office 8/17 for a pre op appt for a colostomy reversal that is supposed to be next Wed. 8/24.    Pt's blood sugar was 512 at pre op  Their office instructed pt to go to the ED. Instead she called her dr office and was made an appt.for Mon, 8/22 with Simona Huh. Dr Arvin Collard wants to know if anyway pt could be seen today to get her sugar under control. Pt is not at their office.  Dr Arvin Collard is checking to see if she had seen her dr.  [phone] contact Levada Dy @ Dr Arvin Collard

## 2021-02-28 NOTE — Telephone Encounter (Signed)
We don't have any availability to see her today. Could use the [facility] virtual platform or UC and then see Simona Huh on 8/22

## 2021-02-28 NOTE — Telephone Encounter (Signed)
Patient has been advised of provider message  and will over to Urgent Care on [address].

## 2021-02-28 NOTE — Telephone Encounter (Signed)
Left message for patient to call back okay for [facility] nurse triage to advise patient of doctors message below. Patient needs immediate evaluation before appt on 03/03/21 she can go to urgent care located on [facility] and there is also [facility]. Through mychart patient can also schedule visit through telehealth and speak with physician, with blood sugar readings this high she needs evaluation today. KW

## 2021-02-28 NOTE — Telephone Encounter (Signed)
PT returned call but I could not get a nurse on the line at the time.

## 2021-03-03 ENCOUNTER — Ambulatory Visit (INDEPENDENT_AMBULATORY_CARE_PROVIDER_SITE_OTHER): Payer: Medicare Other | Admitting: Family Medicine

## 2021-03-03 ENCOUNTER — Encounter: Payer: Self-pay | Admitting: Family Medicine

## 2021-03-03 ENCOUNTER — Other Ambulatory Visit: Payer: Self-pay

## 2021-03-03 VITALS — BP 94/47 | HR 76

## 2021-03-03 DIAGNOSIS — N186 End stage renal disease: Secondary | ICD-10-CM

## 2021-03-03 DIAGNOSIS — Z9889 Other specified postprocedural states: Secondary | ICD-10-CM

## 2021-03-03 DIAGNOSIS — I10 Essential (primary) hypertension: Secondary | ICD-10-CM

## 2021-03-03 DIAGNOSIS — I5032 Chronic diastolic (congestive) heart failure: Secondary | ICD-10-CM

## 2021-03-03 DIAGNOSIS — Z933 Colostomy status: Secondary | ICD-10-CM

## 2021-03-03 DIAGNOSIS — E1122 Type 2 diabetes mellitus with diabetic chronic kidney disease: Secondary | ICD-10-CM | POA: Diagnosis not present

## 2021-03-03 DIAGNOSIS — Z794 Long term (current) use of insulin: Secondary | ICD-10-CM

## 2021-03-03 DIAGNOSIS — E1100 Type 2 diabetes mellitus with hyperosmolarity without nonketotic hyperglycemic-hyperosmolar coma (NKHHC): Secondary | ICD-10-CM | POA: Diagnosis not present

## 2021-03-03 DIAGNOSIS — Z992 Dependence on renal dialysis: Secondary | ICD-10-CM

## 2021-03-03 DIAGNOSIS — Z8719 Personal history of other diseases of the digestive system: Secondary | ICD-10-CM

## 2021-03-03 LAB — POCT GLYCOSYLATED HEMOGLOBIN (HGB A1C): Hemoglobin A1C: 12.9 % — AB (ref 4.0–5.6)

## 2021-03-03 LAB — GLUCOSE, POCT (MANUAL RESULT ENTRY): POC Glucose: 224 mg/dl — AB (ref 70–99)

## 2021-03-03 NOTE — Progress Notes (Signed)
Established patient visit   Patient: [name]   DOB: [birth year]   65 y.o. Female  MRN: [id number] Visit Date: 03/03/2021  Today's healthcare provider: Vernie Murders, PA-C   No chief complaint on file.  Subjective  -------------------------------------------------------------------------------------------------------------------- HPI  Diabetes Mellitus Type II, Follow-up  Lab Results  Component Value Date   HGBA1C 12.9 (A) 03/03/2021   HGBA1C 6.8 (H) 09/27/2020   HGBA1C 8.4 (H) 09/13/2020   Wt Readings from Last 3 Encounters:  11/19/20 279 lb (126.6 kg)  09/27/20 275 lb 8 oz (125 kg)  09/23/20 277 lb 12.5 oz (126 kg)   Last seen for diabetes 5 months ago.  Management since then includes no changes. She reports fair compliance with treatment. She is not having side effects.  Symptoms: No fatigue No foot ulcerations  No appetite changes No nausea  No paresthesia of the feet  No polydipsia  No polyuria No visual disturbances   No vomiting     Home blood sugar records: Unknown - patient not monitoring recently.  Episodes of hypoglycemia? No     Most Recent Eye Exam: Pt due for an eye exam  Current exercise: none Current diet habits: in general, a "healthy" diet    Pertinent Labs: Lab Results  Component Value Date   CHOL 122 09/13/2016   HDL 36 (L) 09/13/2016   LDLCALC 52 09/13/2016   TRIG 168 (H) 09/13/2016   CHOLHDL 3.4 09/13/2016   Lab Results  Component Value Date   NA 132 (L) 12/31/2020   K 4.1 12/31/2020   CREATININE 9.05 (H) 12/31/2020   GFRNONAA 5 (L) 12/31/2020   GFRAA 6 (L) 01/31/2020   GLUCOSE 523 ([place]) 12/31/2020     ---------------------------------------------------------------------------------------------------  Past Medical History:  Diagnosis Date   Anemia    Aortic stenosis    Echo 8/18: mean 13, peak 28, LVOT/AV mean velocity 0.51   Arthritis    Asthma    As a child    Bronchitis    CAD (coronary artery disease)     a. 09/2016: 50% Ost 1st Mrg stenosis, 50% 2nd Mrg stenosis, 20% Mid-Cx, 95% Prox LAD, 40% mid-LAD, and 10% dist-LAD stenosis. Staged PCI with DES to Prox-LAD.    Chronic combined systolic and diastolic CHF (congestive heart failure) ([place]) 2011   echo 2/18: EF 55-60, normal wall motion, grade 2 diastolic dysfunction, trivial AI // echo 3/18: Septal and apical HK, EF 45-50, normal wall motion, trivial AI, mild LAE, PASP 38 // echo 8/18: EF 60-65, normal wall motion, grade 1 diastolic dysfunction, calcified aortic valve leaflets, mild aortic stenosis (mean 13, peak 28, LVOT/AV mean velocity 0.51), mild AI, moderate MAC, mild LAE, trivial TR    Chronic kidney disease    STAGE 4   Chronic kidney disease on chronic dialysis (HCC)    t, th, sat   Complication of anesthesia    Depression    Diabetes mellitus Dx 1989   Elevated lipids    GERD (gastroesophageal reflux disease)    Gout    Heart murmur    asymptomatic   Hepatitis C Dx 2013   Hypertension Dx 1989   Infected surgical wound    Lt arm   Myocardial infarction ([place]) 07/2015   Obesity    Pancreatitis 2013   Pneumonia    Refusal of blood transfusions as patient is Jehovah's Witness    pt states she is not [place] witness and does not refuse blood products   Tendinitis  Tremors of nervous system    LEFT HAND   Ulcer 2010   Past Surgical History:  Procedure Laterality Date   A/V FISTULAGRAM Left 04/11/2019   Procedure: A/V FISTULAGRAM;  Surgeon: Katha Cabal, MD;  Location: [place] [facility];  Service: Cardiovascular;  Laterality: Left;   A/V FISTULAGRAM Left 06/02/2019   Procedure: A/V FISTULAGRAM;  Surgeon: Katha Cabal, MD;  Location: [place] [facility];  Service: Cardiovascular;  Laterality: Left;   APPLICATION OF WOUND VAC Left 08/14/2017   Procedure: APPLICATION OF WOUND VAC Exchange;  Surgeon: Robert Bellow, MD;  Location: [facility];  Service: General;  Laterality: Left;   APPLICATION OF WOUND VAC Left  12/21/2018   Procedure: APPLICATION OF WOUND VAC;  Surgeon: Katha Cabal, MD;  Location: [facility];  Service: Vascular;  Laterality: Left;   AV FISTULA PLACEMENT Left 08/19/2018   Procedure: ARTERIOVENOUS (AV) FISTULA CREATION ( BRACHIOBASILIC );  Surgeon: Katha Cabal, MD;  Location: [facility];  Service: Vascular;  Laterality: Left;   BASCILIC VEIN TRANSPOSITION Left 11/18/2018   Procedure: BASCILIC VEIN TRANSPOSITION;  Surgeon: Katha Cabal, MD;  Location: [facility];  Service: Vascular;  Laterality: Left;   BIOPSY  09/20/2020   Procedure: BIOPSY;  Surgeon: Carol Ada, MD;  Location: [place];  Service: Endoscopy;;   CHOLECYSTECTOMY     COLONOSCOPY WITH PROPOFOL N/A 02/03/2018   Procedure: COLONOSCOPY WITH PROPOFOL;  Surgeon: Lin Landsman, MD;  [facility];  Service: Gastroenterology;  Laterality: N/A;   CORONARY ANGIOPLASTY  07/2015   STENT   CORONARY STENT INTERVENTION N/A 09/18/2016   Procedure: Coronary Stent Intervention;  Surgeon: Troy Sine, MD;  Location: [place] [facility];  Service: Cardiovascular;  Laterality: N/A;   DIALYSIS/PERMA CATHETER INSERTION N/A 05/10/2018   Procedure: DIALYSIS/PERMA CATHETER INSERTION;  Surgeon: Katha Cabal, MD;  Location: [place] [facility];  Service: Cardiovascular;  Laterality: N/A;   DRESSING CHANGE UNDER ANESTHESIA Left 08/15/2017   Procedure: exploration of wound for bleeding;  Surgeon: Robert Bellow, MD;  Location: [facility];  Service: General;  Laterality: Left;   ESOPHAGOGASTRODUODENOSCOPY (EGD) WITH PROPOFOL N/A 02/03/2018   Procedure: ESOPHAGOGASTRODUODENOSCOPY (EGD) WITH PROPOFOL;  Surgeon: Lin Landsman, MD;  Location: [facility];  Service: Gastroenterology;  Laterality: N/A;   ESOPHAGOGASTRODUODENOSCOPY (EGD) WITH PROPOFOL N/A 09/20/2020   Procedure: ESOPHAGOGASTRODUODENOSCOPY (EGD) WITH PROPOFOL;  Surgeon: Carol Ada, MD;  Location: [place];  Service: Endoscopy;  Laterality:  N/A;   EYE SURGERY  11/17/2018   INCISION AND DRAINAGE ABSCESS Left 08/12/2017   Procedure: INCISION AND DRAINAGE ABSCESS;  Surgeon: Robert Bellow, MD;  Location: [facility];  Service: General;  Laterality: Left;   KNEE ARTHROSCOPY     LEFT HEART CATH N/A 09/18/2016   Procedure: Left Heart Cath;  Surgeon: Troy Sine, MD;  Location: [place] [facility];  Service: Cardiovascular;  Laterality: N/A;   LEFT HEART CATH AND CORONARY ANGIOGRAPHY N/A 09/16/2016   Procedure: Left Heart Cath and Coronary Angiography;  Surgeon: Burnell Blanks, MD;  Location: [place] [facility];  Service: Cardiovascular;  Laterality: N/A;   LEFT HEART CATH AND CORONARY ANGIOGRAPHY N/A 04/29/2017   Procedure: LEFT HEART CATH AND CORONARY ANGIOGRAPHY;  Surgeon: Nelva Bush, MD;  Location: [place] [facility];  Service: Cardiovascular;  Laterality: N/A;   LOWER EXTREMITY ANGIOGRAPHY Right 03/08/2018   Procedure: LOWER EXTREMITY ANGIOGRAPHY;  Surgeon: Katha Cabal, MD;  Location: [place] [facility];  Service:  Cardiovascular;  Laterality: Right;   TUBAL LIGATION     TUBAL LIGATION     UPPER EXTREMITY ANGIOGRAPHY Right 09/19/2019   Procedure: UPPER EXTREMITY ANGIOGRAPHY;  Surgeon: Katha Cabal, MD;  Location: [place] [facility];  Service: Cardiovascular;  Laterality: Right;   WOUND DEBRIDEMENT Left 12/21/2018   Procedure: DEBRIDEMENT WOUND;  Surgeon: Katha Cabal, MD;  Location: [facility];  Service: Vascular;  Laterality: Left;   WOUND DEBRIDEMENT Left 12/30/2018   Procedure: DEBRIDEMENT WOUND WITH VAC PLACEMENT (LEFT UPPER EXTREMITY);  Surgeon: Katha Cabal, MD;  Location: [facility];  Service: Vascular;  Laterality: Left;   Family History  Problem Relation Age of Onset   Colon cancer Mother    Heart attack Other    Heart attack Maternal Grandmother    Hypertension Sister    Hypertension Brother    Diabetes Paternal Grandmother    Breast cancer Neg Hx    Social History   Tobacco Use    Smoking status: Former    Packs/day: 0.25    Years: 6.00    Pack years: 1.50    Types: Cigarettes    Quit date: 10/25/1980    Years since quitting: 40.3   Smokeless tobacco: Never  Vaping Use   Vaping Use: Never used  Substance Use Topics   Alcohol use: Yes    Comment: rare   Drug use: Not Currently    Types: Marijuana   Allergies  Allergen Reactions   Morphine Itching    Tolerated hydromorphone on 07/2020   Shellfish Allergy Anaphylaxis and Swelling   Diazepam Other (See Comments)    "felt like out of body experience"   Medications: Outpatient Medications Prior to Visit  Medication Sig   albuterol (VENTOLIN HFA) 108 (90 Base) MCG/ACT inhaler Inhale 2 puffs into the lungs every 4 (four) hours as needed for wheezing or shortness of breath.   allopurinol (ZYLOPRIM) 100 MG tablet TAKE 1 TABLET BY MOUTH ONCE DAILY.   aspirin 81 MG EC tablet Take 1 tablet (81 mg total) by mouth daily.   atorvastatin (LIPITOR) 80 MG tablet TAKE 1 TABLET BY MOUTH ONCE DAILY AT 6PM (Patient taking differently: Take 80 mg by mouth every evening.)   carvedilol (COREG) 25 MG tablet Take 1 tablet (25 mg total) by mouth 2 (two) times daily with a meal.   Continuous Blood Gluc Receiver (FREESTYLE LIBRE 14 DAY READER) DEVI To check blood sugar ACHS   Continuous Blood Gluc Sensor (FREESTYLE LIBRE 14 DAY SENSOR) MISC To check blood sugar ACHS; change every 14 days   cyclobenzaprine (FLEXERIL) 10 MG tablet Take 1 tablet (10 mg total) by mouth 2 (two) times daily as needed for muscle spasms.   diclofenac Sodium (VOLTAREN) 1 % GEL Apply 2 g topically 4 (four) times daily.   dicyclomine (BENTYL) 20 MG tablet Take 1 tablet (20 mg total) by mouth in the morning and at bedtime.   doxepin (SINEQUAN) 10 MG capsule TAKE 1 CAPSULE(10 MG) BY MOUTH FOUR TIMES DAILY AS NEEDED FOR ITCHING (Patient taking differently: Take 10 mg by mouth 2 (two) times daily.)   fluticasone (FLONASE) 50 MCG/ACT nasal spray Place 2 sprays into  both nostrils daily as needed for allergies or rhinitis.   fluticasone furoate-vilanterol (BREO ELLIPTA) 200-25 MCG/INH AEPB Inhale 1 puff into the lungs daily.   hydrOXYzine (ATARAX/VISTARIL) 25 MG tablet Take 1 tablet (25 mg total) by mouth 4 (four) times daily.   insulin lispro (HUMALOG KWIKPEN) 100 UNIT/ML KwikPen Inject  25 Units into the skin with breakfast, with lunch, and with evening meal. (Patient taking differently: Inject 7 Units into the skin 2 (two) times daily.)   lidocaine-prilocaine (EMLA) cream Apply 1 application topically as needed. (Patient taking differently: Apply 1 application topically as needed (dialysis days).)   nitroGLYCERIN (NITROSTAT) 0.4 MG SL tablet Place 1 tablet (0.4 mg total) under the tongue every 5 (five) minutes x 3 doses as needed for chest pain.   Nutritional Supplements (FEEDING SUPPLEMENT, NEPRO CARB STEADY,) LIQD Take 237 mLs by mouth daily.   nystatin cream (MYCOSTATIN) Apply 1 application topically 2 (two) times daily. (Patient taking differently: Apply 1 application topically daily as needed for dry skin.)   oxyCODONE (ROXICODONE) 5 MG immediate release tablet Take 1 tablet (5 mg total) by mouth every 12 (twelve) hours as needed for severe pain.   oxyCODONE-acetaminophen (PERCOCET/ROXICET) 5-325 MG tablet Take 1 tablet by mouth 4 (four) times daily as needed for pain.   pregabalin (LYRICA) 150 MG capsule Take 1 capsule (150 mg total) by mouth 2 (two) times daily.   sevelamer carbonate (RENVELA) 800 MG tablet Take 800 mg by mouth 3 (three) times daily.   ULTICARE SHORT PEN NEEDLES 31G X 8 MM MISC USE THREE TIMES DAILY WITH INSULIN   VICTOZA 18 MG/3ML SOPN Start with 0.60m daily and increase by 0.644mper week until max dose of 1.8 mg dose achieved.   AURYXIA 1 GM 210 MG(Fe) tablet Take 2 tablets (420 mg total) by mouth 3 (three) times daily with meals.   B Complex-C-Folic Acid (RENA-VITE RX PO) Take 1 mg by mouth daily.   B Complex-C-Folic Acid (RENA-VITE  RX) 1 MG TABS Take 1 mg by mouth daily.   buPROPion (WELLBUTRIN SR) 150 MG 12 hr tablet Take 1 tablet (150 mg total) by mouth 2 (two) times daily.   colchicine 0.6 MG tablet Take 1 tablet (0.6 mg total) by mouth once a week for 15 days.   docusate sodium (COLACE) 100 MG capsule Take 1 capsule (100 mg total) by mouth 2 (two) times daily as needed for mild constipation.   hydrALAZINE (APRESOLINE) 100 MG tablet Take 100 mg by mouth 3 (three) times daily.   insulin detemir (LEVEMIR FLEXTOUCH) 100 UNIT/ML FlexPen Inject 60 Units into the skin 2 (two) times daily.   insulin glargine (LANTUS) 100 unit/mL SOPN Inject 25 Units into the skin 2 (two) times daily.   loratadine (CLARITIN) 10 MG tablet TAKE 1 TABLET BY MOUTH ONCE DAILY. (Patient taking differently: Take 10 mg by mouth daily as needed for allergies.)   montelukast (SINGULAIR) 10 MG tablet TAKE ONE TABLET BY MOUTH AT BEDTIME. (Patient taking differently: Take 10 mg by mouth at bedtime as needed (allergies).)   omeprazole (PRILOSEC) 20 MG capsule Take 20 mg by mouth daily.   pantoprazole (PROTONIX) 40 MG tablet Take 1 tablet (40 mg total) by mouth 2 (two) times daily.   SENNA-PLUS 8.6-50 MG tablet TAKE ONE TABLET BY MOUTH AT BEDTIME.   sucralfate (CARAFATE) 1 g tablet Take 1 tablet (1 g total) by mouth 2 (two) times daily for 26 days.   torsemide (DEMADEX) 20 MG tablet TAKE (2) TABLETS BY MOUTH TWICE DAILY.   triamcinolone cream (KENALOG) 0.1 % APPLY TO AFFECTED AREA TWICE DAILY (Patient taking differently: Apply 1 application topically 2 (two) times daily as needed (rash).)   valACYclovir (VALTREX) 500 MG tablet TAKE (1) TABLET BY MOUTH EVERY OTHER DAY.   Vitamin D, Ergocalciferol, (DRISDOL) 1.25 MG (  50000 UNIT) CAPS capsule TAKE 1 CAPSULE BY MOUTH ONCE A MONTH   No facility-administered medications prior to visit.    Review of Systems  Constitutional: Negative.   Respiratory: Negative.    Cardiovascular: Negative.   Gastrointestinal:  Negative.   Neurological:  Positive for light-headedness. Negative for dizziness and headaches.      Objective  -------------------------------------------------------------------------------------------------------------------- BP (!) 94/47 (BP Location: Right Wrist, Patient Position: Sitting, Cuff Size: Normal)   Pulse 76   SpO2 96%   Physical Exam Constitutional:      Appearance: She is obese.  HENT:     Head: Normocephalic.     Nose: Nose normal.  Eyes:     Extraocular Movements: Extraocular movements intact.     Conjunctiva/sclera: Conjunctivae normal.  Cardiovascular:     Rate and Rhythm: Normal rate and regular rhythm.     Heart sounds: Normal heart sounds.  Pulmonary:     Effort: Pulmonary effort is normal.     Breath sounds: Normal breath sounds.  Abdominal:     General: Bowel sounds are normal.     Palpations: Abdomen is soft.     Comments: Colostomy in left upper quadrant.  Musculoskeletal:     Cervical back: Neck supple.     Comments: Tan pigmentation of anterior lower legs bilaterally. Some scattered scabbed sores on lower legs.   Skin:    General: Skin is warm.  Neurological:     Mental Status: She is alert and oriented to person, place, and time.  Psychiatric:        Mood and Affect: Mood normal.   Results for orders placed or performed in visit on 03/03/21  POCT glycosylated hemoglobin (Hb A1C)  Result Value Ref Range   Hemoglobin A1C 12.9 (A) 4.0 - 5.6 %  POCT glucose (manual entry)  Result Value Ref Range   POC Glucose 224 (A) 70 - 99 mg/dl    Assessment & Plan  ---------------------------------------------------------------------------------------------------------------------- 1. Uncontrolled type 2 diabetes mellitus with hyperosmolar nonketotic hyperglycemia (HCC) Hgb A1C 12.9 today. Had pre-op labs done (in preparation for colostomy reversal) on 02-26-21 and glucose was 512, which postponed surgery. Has been off her insulin recently. Will restart  Lantus and should continue Novolog by sliding scale. Will schedule urgent endocrine referral to get stabilized. - POCT glycosylated hemoglobin (Hb A1C) - POCT glucose (manual entry) - Ambulatory referral to Endocrinology  2. Type 2 diabetes mellitus with chronic kidney disease on chronic dialysis, with long-term current use of insulin ([place]) Gets dialysis Tuesday, Thursday and Saturday each week. Has had severe episodes of lethargy after dialysis in [place], [place]. Needs to get re-established with diabetes specialist. Daughter with her today and may prefer local specialist. Continue regular dialysis. On 02-26-21 BUN 29, Creatinine 5.73, Alkaline Phosphatase 312, Total protein 7.6, Albumin 3.8, CBC - hgb 11.3, hct 33.6, platelets 140 and wbc 6300. - Ambulatory referral to Endocrinology  3. Chronic diastolic heart failure (HCC) Diagnose in 2018 and presently on Carvedilol 25 mg BID and Torsemide 20 mg 2 tablets BID. Continue follow up with cardiologist.  4. Essential hypertension BP a little low today. Recommend she drink extra fluids. Presently on Carvedilol as above.  5. Colostomy status ([place]) History of diverting colostomy with necrotizing fasciitis of her groin Dec. 2021. Planning reversal soon. Surgeon documents a parastomal hernia.  6. History of necrotizing enterocolitis with intestinal resection Diagnosed in December 2021. Recent surgical evaluation in [place], [place] indicated groin has healed completely and is ready for colostomy  reversal (Dr. Arvin Collard - surgeon).   No follow-ups on file.         Vernie Murders, PA-C  [employer] [phone] (phone) [phone] (fax)  [place]

## 2021-03-04 ENCOUNTER — Other Ambulatory Visit: Payer: Self-pay

## 2021-03-04 ENCOUNTER — Telehealth: Payer: Self-pay

## 2021-03-04 ENCOUNTER — Ambulatory Visit: Payer: Self-pay | Admitting: *Deleted

## 2021-03-04 DIAGNOSIS — E1100 Type 2 diabetes mellitus with hyperosmolarity without nonketotic hyperglycemic-hyperosmolar coma (NKHHC): Secondary | ICD-10-CM

## 2021-03-04 MED ORDER — INSULIN GLARGINE 100 UNITS/ML SOLOSTAR PEN: 25.0000 [IU] | PEN_INJECTOR | Freq: Every day | SUBCUTANEOUS | 12 refills | Status: DC

## 2021-03-04 MED ORDER — INSULIN GLARGINE 100 UNIT/ML SOLOSTAR PEN: PEN_INJECTOR | SUBCUTANEOUS | 11 refills | Status: DC

## 2021-03-04 MED ORDER — INSULIN GLARGINE 100 UNITS/ML SOLOSTAR PEN: PEN_INJECTOR | SUBCUTANEOUS | 12 refills | Status: DC

## 2021-03-04 NOTE — Telephone Encounter (Signed)
I returned the call to Rebeca Alert, daughter.   She had called again concerned about [name]'s blood sugar being 420.  Her earlier call with this information was sent to [employer], PA-C.   Waiting for a response from [place].  I forwarded this note to [facility] high priority for [employer], PA-C.

## 2021-03-04 NOTE — Telephone Encounter (Signed)
Copied from [place] [phone]. Topic: General - Other >> Mar 04, 2021  1:12 PM Leward Quan A wrote: Reason for CRM: Patient daughter Rosanne Sack called in to inform Vernie Murders that patient blood sugar is 420 and she have not eaten anything and she is waiting on Lantus that was to be sent to the pharmacy but nothing is there asking if this Rx can be sent right away. And please call Rosanne Sack she is waiting to hear from the nurse. Per daughter Rosanne Sack patient is very upset and crying because she does not know what to do. Please advise  Ph#   [phone]

## 2021-03-04 NOTE — Telephone Encounter (Signed)
Lmtcb. PEC please advise that [facility]antus has been called into [facility].

## 2021-03-04 NOTE — Telephone Encounter (Signed)
Prescription called in to [facility].

## 2021-03-04 NOTE — Telephone Encounter (Signed)
Reason for Disposition  Blood glucose > 400 mg/dL (22.2 mmol/L)    Added this response to earlier call for elevated glucose 420.   Awaiting Dennis Chrismon's response  Answer Assessment - Initial Assessment Questions 1. BLOOD GLUCOSE: "What is your blood glucose level?"      I returned the call to daughter, Wessie Shanks.   Her mother's blood sugar is 420 now.   Jazz called in earlier and the message has been sent to [employer].   We are waiting on his reply.    I let Rosanne Sack know this.   Shareena is not eating anything.   She is stressed and anxious.   She is crying  and shaking.   She is for surgery tomorrow but they had to reschedule it due to her high glucoses.    Leaira needs her rest.   She's on a lot of medicines I know and Simona Huh Chrismon didn't want to prescribe her anything for her nerves when he saw her yesterday but she needs something.   She's not eating, sleeping, she's crying and restless. She is upset because they were supposed to do surgery tomorrow to reverse her ostomy but now they have postponed the surgery due to her elevated glucose levels.     "I don't know what to do with her per daughter [name].    No further triage done since this was addressed earlier and awaiting Dennis Chrismon's response.   I resent this information to [facility] for [employer], PA-C.  2. ONSET: "When did you check the blood glucose?"     *No Answer* 3. USUAL RANGE: "What is your glucose level usually?" (e.g., usual fasting morning value, usual evening value)     *No Answer* 4. KETONES: "Do you check for ketones (urine or blood test strips)?" If yes, ask: "What does the test show now?"      *No Answer* 5. TYPE 1 or 2:  "Do you know what type of diabetes you have?"  (e.g., Type 1, Type 2, Gestational; doesn't know)      *No Answer* 6. INSULIN: "Do you take insulin?" "What type of insulin(s) do you use? What is the mode of delivery? (syringe, pen; injection or pump)?"      *No  Answer* 7. DIABETES PILLS: "Do you take any pills for your diabetes?" If yes, ask: "Have you missed taking any pills recently?"     *No Answer* 8. OTHER SYMPTOMS: "Do you have any symptoms?" (e.g., fever, frequent urination, difficulty breathing, dizziness, weakness, vomiting)     *No Answer* 9. PREGNANCY: "Is there any chance you are pregnant?" "When was your last menstrual period?"     *No Answer*  Protocols used: Diabetes - High Blood Sugar-A-AH

## 2021-03-04 NOTE — Telephone Encounter (Signed)
Patients daughter, Noel Christmas was to call in Lantus yesterday during visit and it was not done.   Please send ASAP to [facility] on  [address]. [place]

## 2021-03-05 ENCOUNTER — Emergency Department (HOSPITAL_COMMUNITY)
Admission: EM | Admit: 2021-03-05 | Discharge: 2021-03-05 | Disposition: A | Discharge status: Home / Self Care | Payer: Medicare Other | Attending: Emergency Medicine | Admitting: Emergency Medicine

## 2021-03-05 ENCOUNTER — Encounter: Payer: Self-pay | Admitting: Family Medicine

## 2021-03-05 ENCOUNTER — Encounter (HOSPITAL_COMMUNITY): Payer: Self-pay

## 2021-03-05 ENCOUNTER — Emergency Department (HOSPITAL_COMMUNITY): Payer: Medicare Other

## 2021-03-05 DIAGNOSIS — J449 Chronic obstructive pulmonary disease, unspecified: Secondary | ICD-10-CM | POA: Diagnosis not present

## 2021-03-05 DIAGNOSIS — E1142 Type 2 diabetes mellitus with diabetic polyneuropathy: Secondary | ICD-10-CM | POA: Diagnosis not present

## 2021-03-05 DIAGNOSIS — Z87891 Personal history of nicotine dependence: Secondary | ICD-10-CM | POA: Insufficient documentation

## 2021-03-05 DIAGNOSIS — E1122 Type 2 diabetes mellitus with diabetic chronic kidney disease: Secondary | ICD-10-CM | POA: Insufficient documentation

## 2021-03-05 DIAGNOSIS — Z794 Long term (current) use of insulin: Secondary | ICD-10-CM | POA: Diagnosis not present

## 2021-03-05 DIAGNOSIS — I5042 Chronic combined systolic (congestive) and diastolic (congestive) heart failure: Secondary | ICD-10-CM | POA: Insufficient documentation

## 2021-03-05 DIAGNOSIS — Z7982 Long term (current) use of aspirin: Secondary | ICD-10-CM | POA: Diagnosis not present

## 2021-03-05 DIAGNOSIS — Z79899 Other long term (current) drug therapy: Secondary | ICD-10-CM | POA: Insufficient documentation

## 2021-03-05 DIAGNOSIS — J45909 Unspecified asthma, uncomplicated: Secondary | ICD-10-CM | POA: Insufficient documentation

## 2021-03-05 DIAGNOSIS — R5383 Other fatigue: Secondary | ICD-10-CM | POA: Diagnosis present

## 2021-03-05 DIAGNOSIS — Z992 Dependence on renal dialysis: Secondary | ICD-10-CM | POA: Insufficient documentation

## 2021-03-05 DIAGNOSIS — Z955 Presence of coronary angioplasty implant and graft: Secondary | ICD-10-CM | POA: Insufficient documentation

## 2021-03-05 DIAGNOSIS — N186 End stage renal disease: Secondary | ICD-10-CM | POA: Diagnosis not present

## 2021-03-05 DIAGNOSIS — I2510 Atherosclerotic heart disease of native coronary artery without angina pectoris: Secondary | ICD-10-CM | POA: Insufficient documentation

## 2021-03-05 DIAGNOSIS — E1165 Type 2 diabetes mellitus with hyperglycemia: Secondary | ICD-10-CM | POA: Insufficient documentation

## 2021-03-05 DIAGNOSIS — I132 Hypertensive heart and chronic kidney disease with heart failure and with stage 5 chronic kidney disease, or end stage renal disease: Secondary | ICD-10-CM | POA: Insufficient documentation

## 2021-03-05 DIAGNOSIS — D631 Anemia in chronic kidney disease: Secondary | ICD-10-CM | POA: Insufficient documentation

## 2021-03-05 DIAGNOSIS — E1140 Type 2 diabetes mellitus with diabetic neuropathy, unspecified: Secondary | ICD-10-CM | POA: Insufficient documentation

## 2021-03-05 DIAGNOSIS — Z7951 Long term (current) use of inhaled steroids: Secondary | ICD-10-CM | POA: Insufficient documentation

## 2021-03-05 DIAGNOSIS — R739 Hyperglycemia, unspecified: Secondary | ICD-10-CM

## 2021-03-05 LAB — CBC WITH DIFFERENTIAL/PLATELET
Abs Immature Granulocytes: 0.05 10*3/uL (ref 0.00–0.07)
Basophils Absolute: 0 10*3/uL (ref 0.0–0.1)
Basophils Relative: 0 %
Eosinophils Absolute: 0.1 10*3/uL (ref 0.0–0.5)
Eosinophils Relative: 2 %
HCT: 32.4 % — ABNORMAL LOW (ref 36.0–46.0)
Hemoglobin: 10.3 g/dL — ABNORMAL LOW (ref 12.0–15.0)
Immature Granulocytes: 1 %
Lymphocytes Relative: 17 %
Lymphs Abs: 0.9 10*3/uL (ref 0.7–4.0)
MCH: 31.7 pg (ref 26.0–34.0)
MCHC: 31.8 g/dL (ref 30.0–36.0)
MCV: 99.7 fL (ref 80.0–100.0)
Monocytes Absolute: 0.5 10*3/uL (ref 0.1–1.0)
Monocytes Relative: 10 %
Neutro Abs: 3.7 10*3/uL (ref 1.7–7.7)
Neutrophils Relative %: 70 %
Platelets: 163 10*3/uL (ref 150–400)
RBC: 3.25 MIL/uL — ABNORMAL LOW (ref 3.87–5.11)
RDW: 16.6 % — ABNORMAL HIGH (ref 11.5–15.5)
WBC: 5.3 10*3/uL (ref 4.0–10.5)
nRBC: 0 % (ref 0.0–0.2)

## 2021-03-05 LAB — URINALYSIS, ROUTINE W REFLEX MICROSCOPIC
Bilirubin Urine: NEGATIVE
Glucose, UA: >=500 mg/dL — AB
Hgb urine dipstick: NEGATIVE
Ketones, ur: NEGATIVE mg/dL
Nitrite: NEGATIVE
Protein, ur: 30 mg/dL — AB
Specific Gravity, Urine: 1.012 (ref 1.005–1.030)
pH: 5 (ref 5.0–8.0)

## 2021-03-05 LAB — COMPREHENSIVE METABOLIC PANEL
ALT: 36 U/L (ref 0–44)
AST: 32 U/L (ref 15–41)
Albumin: 3.4 g/dL — ABNORMAL LOW (ref 3.5–5.0)
Alkaline Phosphatase: 258 U/L — ABNORMAL HIGH (ref 38–126)
Anion gap: 15 (ref 5–15)
BUN: 56 mg/dL — ABNORMAL HIGH (ref 8–23)
CO2: 24 mmol/L (ref 22–32)
Calcium: 8.5 mg/dL — ABNORMAL LOW (ref 8.9–10.3)
Chloride: 94 mmol/L — ABNORMAL LOW (ref 98–111)
Creatinine, Ser: 8.42 mg/dL — ABNORMAL HIGH (ref 0.44–1.00)
GFR, Estimated: 5 mL/min — ABNORMAL LOW (ref >60)
Glucose, Bld: 400 mg/dL — ABNORMAL HIGH (ref 70–99)
Potassium: 4.8 mmol/L (ref 3.5–5.1)
Sodium: 133 mmol/L — ABNORMAL LOW (ref 135–145)
Total Bilirubin: 0.6 mg/dL (ref 0.3–1.2)
Total Protein: 7.1 g/dL (ref 6.5–8.1)

## 2021-03-05 LAB — CBG MONITORING, ED: Glucose-Capillary: 418 mg/dL — ABNORMAL HIGH (ref 70–99)

## 2021-03-05 MED ORDER — INSULIN ASPART 100 UNIT/ML IJ SOLN
5.0000 [IU] | Freq: Once | INTRAMUSCULAR | Status: AC
  Administered 2021-03-05: 5 [IU] via INTRAVENOUS
  Filled 2021-03-05: qty 0.05

## 2021-03-05 NOTE — Discharge Instructions (Addendum)
As discussed, your evaluation today has been largely reassuring.  But, it is important that you monitor your condition carefully, and do not hesitate to return to the ED if you develop new, or concerning changes in your condition. ? ?Otherwise, please follow-up with your physician for appropriate ongoing care. ? ?

## 2021-03-05 NOTE — Telephone Encounter (Signed)
RX sent to pharmacy  

## 2021-03-05 NOTE — Telephone Encounter (Signed)
Restarted Lantus 25 units daily and increase by 2 units every 3 days if average FBS over 150. Should continue Humalog TID with meals and sliding scale as usual. Continue Victoza 1.8 mg qd, also. Working on getting a endocrinology referral as soon as possible to help get stabilized in preparation for colostomy reversal.
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
## 2021-03-05 NOTE — ED Provider Notes (Signed)
[facility] DEPT Provider Note   CSN: [id number] Arrival date & time: 03/05/21  1348     History Chief Complaint  Patient presents with   Hyperglycemia   Back Pain    [name] is a 65 y.o. female.  HPI Patient presents with concern of hyperglycemia.  On she has multiple medical issues including end-stage renal disease, he is on dialysis, last had 4 days ago.  She notes that today, she has had glucose above the limit on her home glucometer.  She describes some fatigue, but no vomiting, diarrhea, fever, specific pain.  She notes that over the past 2 days, while having outpatient evaluation for her colostomy she has had inconsistent intake of her Lantus.     Past Medical History:  Diagnosis Date   Anemia    Aortic stenosis    Echo 8/18: mean 13, peak 28, LVOT/AV mean velocity 0.51   Arthritis    Asthma    As a child    Bronchitis    CAD (coronary artery disease)    a. 09/2016: 50% Ost 1st Mrg stenosis, 50% 2nd Mrg stenosis, 20% Mid-Cx, 95% Prox LAD, 40% mid-LAD, and 10% dist-LAD stenosis. Staged PCI with DES to Prox-LAD.    Chronic combined systolic and diastolic CHF (congestive heart failure) ([place]) 2011   echo 2/18: EF 55-60, normal wall motion, grade 2 diastolic dysfunction, trivial AI // echo 3/18: Septal and apical HK, EF 45-50, normal wall motion, trivial AI, mild LAE, PASP 38 // echo 8/18: EF 60-65, normal wall motion, grade 1 diastolic dysfunction, calcified aortic valve leaflets, mild aortic stenosis (mean 13, peak 28, LVOT/AV mean velocity 0.51), mild AI, moderate MAC, mild LAE, trivial TR    Chronic kidney disease    STAGE 4   Chronic kidney disease on chronic dialysis (HCC)    t, th, sat   Complication of anesthesia    Depression    Diabetes mellitus Dx 1989   Elevated lipids    GERD (gastroesophageal reflux disease)    Gout    Heart murmur    asymptomatic   Hepatitis C Dx 2013   Hypertension Dx 1989   Infected surgical wound     Lt arm   Myocardial infarction ([place]) 07/2015   Obesity    Pancreatitis 2013   Pneumonia    Refusal of blood transfusions as patient is Jehovah's Witness    pt states she is not [place] witness and does not refuse blood products   Tendinitis    Tremors of nervous system    LEFT HAND   Ulcer 2010    Patient Active Problem List   Diagnosis Date Noted   Upper GI bleed 09/17/2020   Prolonged QT interval 09/17/2020   COPD (chronic obstructive pulmonary disease) ([place]) 09/17/2020   Acute pulmonary edema ([place]) 09/14/2020   Acute respiratory failure with hypoxia ([facility]) 09/13/2020   Allergy, unspecified, initial encounter 03/29/2020   Anaphylactic shock, unspecified, initial encounter 03/29/2020   Volume overload 01/30/2020   Pain in left upper arm 01/18/2020   Cellulitis 01/16/2020   Anemia due to end stage renal disease ([place]) 01/16/2020   Diabetic polyneuropathy associated with type 2 diabetes mellitus ([place]) 11/06/2019   Nystagmus 11/06/2019   Intermittent claudication ([place]) 11/06/2019   Vertigo of central origin 11/06/2019   Weakness    Hypervolemia associated with renal insufficiency 10/31/2019   BMI 45.0-49.9, adult ([place]) 10/18/2019   Ascending aortic aneurysm ([place]) 03/27/2019   Puncture wound without foreign  body of left forearm, initial encounter 01/02/2019   Non-healing wound of upper extremity 12/28/2018   Unspecified open wound of left upper arm, initial encounter 81/82/9937   Complication from renal dialysis device 10/20/2018   Iron deficiency anemia, unspecified 10/18/2018   ESRD (end stage renal disease) on dialysis ([place]) 06/01/2018   ARF (acute renal failure) ([place]) 05/09/2018   Colon cancer screening    LGSIL on Pap smear of cervix 01/27/2018   Gout 12/25/2017   AKI (acute kidney injury) ([place]) 12/24/2017   Hyperglycemia 12/24/2017   Chronic diastolic heart failure ([place]) 09/30/2017   Lymphedema 09/30/2017   Aortic stenosis    Acute pain of both knees 02/12/2017    Chronic gout due to renal impairment of multiple sites without tophus 02/12/2017   Esophageal dysphagia 02/12/2017   Osteoarthritis 01/22/2017   Diabetic hyperosmolar non-ketotic state ([place]) 12/13/2016   CAD (coronary artery disease) 12/13/2016   Hyperlipidemia 12/13/2016   Hyperphosphatemia 16/96/7893   Acute diastolic CHF (congestive heart failure) (HCC)    Hyperkalemia 08/21/2016   Asthma 11/29/2015   Depression 09/05/2015   GERD (gastroesophageal reflux disease) 03/25/2015   Environmental allergies 03/14/2015   SOB (shortness of breath) 02/15/2015   Chronic hepatitis C without hepatic coma ([place]) 01/31/2015   Diabetic neuropathy ([place]) 01/31/2015   OSA (obstructive sleep apnea) 01/01/2015   Poor dentition 11/13/2014   Essential hypertension 10/08/2014   Morbid (severe) obesity due to excess calories ([place]) 10/08/2014   Localized, primary osteoarthritis 01/25/2014   Family history of diabetes mellitus 03/31/2013   Pain in limb 03/31/2013   Uncontrolled type 2 diabetes mellitus with hyperosmolar nonketotic hyperglycemia ([place]) 03/25/2012    Class: Chronic    Past Surgical History:  Procedure Laterality Date   A/V FISTULAGRAM Left 04/11/2019   Procedure: A/V FISTULAGRAM;  Surgeon: Katha Cabal, MD;  Location: [facility];  Service: Cardiovascular;  Laterality: Left;   A/V FISTULAGRAM Left 06/02/2019   Procedure: A/V FISTULAGRAM;  Surgeon: Katha Cabal, MD;  Location: [facility];  Service: Cardiovascular;  Laterality: Left;   APPLICATION OF WOUND VAC Left 08/14/2017   Procedure: APPLICATION OF WOUND VAC Exchange;  Surgeon:  Bellow, MD;  Location: [facility];  Service: General;  Laterality: Left;   APPLICATION OF WOUND VAC Left 12/21/2018   Procedure: APPLICATION OF WOUND VAC;  Surgeon: Katha Cabal, MD;  Location: [facility];  Service: Vascular;  Laterality: Left;   AV FISTULA PLACEMENT Left 08/19/2018   Procedure: ARTERIOVENOUS (AV) FISTULA  CREATION ( BRACHIOBASILIC );  Surgeon: Katha Cabal, MD;  Location: [facility];  Service: Vascular;  Laterality: Left;   BASCILIC VEIN TRANSPOSITION Left 11/18/2018   Procedure: BASCILIC VEIN TRANSPOSITION;  Surgeon: Katha Cabal, MD;  Location: [facility];  Service: Vascular;  Laterality: Left;   BIOPSY  09/20/2020   Procedure: BIOPSY;  Surgeon: Carol Ada, MD;  Location: [facility];  Service: Endoscopy;;   CHOLECYSTECTOMY     COLONOSCOPY WITH PROPOFOL N/A 02/03/2018   Procedure: COLONOSCOPY WITH PROPOFOL;  Surgeon: Lin Landsman, MD;  Location[facility];  Service: Gastroenterology;  Laterality: N/A;   CORONARY ANGIOPLASTY  07/2015   STENT   CORONARY STENT INTERVENTION N/A 09/18/2016   Procedure: Coronary Stent Intervention;  Surgeon: Troy Sine, MD;  Location: [facility];  Service: Cardiovascular;  Laterality: N/A;   DIALYSIS/PERMA CATHETER INSERTION N/A 05/10/2018   Procedure: DIALYSIS/PERMA CATHETER INSERTION;  Surgeon: Katha Cabal, MD;  Location: [facility];  Service: Cardiovascular;  Laterality: N/A;   DRESSING CHANGE UNDER ANESTHESIA Left 08/15/2017   Procedure: exploration of wound for bleeding;  Surgeon:  Bellow, MD;  Location: [facility];  Service: General;  Laterality: Left;   ESOPHAGOGASTRODUODENOSCOPY (EGD) WITH PROPOFOL N/A 02/03/2018   Procedure: ESOPHAGOGASTRODUODENOSCOPY (EGD) WITH PROPOFOL;  Surgeon: Lin Landsman, MD;  Location: [facility];  Service: Gastroenterology;  Laterality: N/A;   ESOPHAGOGASTRODUODENOSCOPY (EGD) WITH PROPOFOL N/A 09/20/2020   Procedure: ESOPHAGOGASTRODUODENOSCOPY (EGD) WITH PROPOFOL;  Surgeon: Carol Ada, MD;  Location: [facility];  Service: Endoscopy;  Laterality: N/A;   EYE SURGERY  11/17/2018   INCISION AND DRAINAGE ABSCESS Left 08/12/2017   Procedure: INCISION AND DRAINAGE ABSCESS;  Surgeon:  Bellow, MD;  Location: [facility];  Service: General;  Laterality: Left;   KNEE  ARTHROSCOPY     LEFT HEART CATH N/A 09/18/2016   Procedure: Left Heart Cath;  Surgeon: Troy Sine, MD;  Location: [facility];  Service: Cardiovascular;  Laterality: N/A;   LEFT HEART CATH AND CORONARY ANGIOGRAPHY N/A 09/16/2016   Procedure: Left Heart Cath and Coronary Angiography;  Surgeon: Burnell Blanks, MD;  Location: [facility];  Service: Cardiovascular;  Laterality: N/A;   LEFT HEART CATH AND CORONARY ANGIOGRAPHY N/A 04/29/2017   Procedure: LEFT HEART CATH AND CORONARY ANGIOGRAPHY;  Surgeon: Nelva Bush, MD;  Location: [facility];  Service: Cardiovascular;  Laterality: N/A;   LOWER EXTREMITY ANGIOGRAPHY Right 03/08/2018   Procedure: LOWER EXTREMITY ANGIOGRAPHY;  Surgeon: Katha Cabal, MD;  Location: [facility];  Service: Cardiovascular;  Laterality: Right;   TUBAL LIGATION     TUBAL LIGATION     UPPER EXTREMITY ANGIOGRAPHY Right 09/19/2019   Procedure: UPPER EXTREMITY ANGIOGRAPHY;  Surgeon: Katha Cabal, MD;  Location: [facility];  Service: Cardiovascular;  Laterality: Right;   WOUND DEBRIDEMENT Left 12/21/2018   Procedure: DEBRIDEMENT WOUND;  Surgeon: Katha Cabal, MD;  Location: [facility];  Service: Vascular;  Laterality: Left;   WOUND DEBRIDEMENT Left 12/30/2018   Procedure: DEBRIDEMENT WOUND WITH VAC PLACEMENT (LEFT UPPER EXTREMITY);  Surgeon: Katha Cabal, MD;  Location: [facility];  Service: Vascular;  Laterality: Left;     OB History     Gravida  4   Para  3   Term  3   Preterm      AB  1   Living  3      SAB  1   IAB      Ectopic      Multiple      Live Births  3           Family History  Problem Relation Age of Onset   Colon cancer Mother    Heart attack Other    Heart attack Maternal Grandmother    Hypertension Sister    Hypertension Brother    Diabetes Paternal Grandmother    Breast cancer Neg Hx     Social History   Tobacco Use   Smoking status: Former     Packs/day: 0.25    Years: 6.00    Pack years: 1.50    Types: Cigarettes    Quit date: 10/25/1980    Years since quitting: 40.3   Smokeless tobacco: Never  Vaping Use   Vaping Use: Never used  Substance Use Topics   Alcohol use: Yes    Comment: rare   Drug use: Not Currently    Types:  Marijuana    Home Medications Prior to Admission medications   Medication Sig Start Date End Date Taking? Authorizing Provider  albuterol (VENTOLIN HFA) 108 (90 Base) MCG/ACT inhaler Inhale 2 puffs into the lungs every 4 (four) hours as needed for wheezing or shortness of breath. 06/28/20   [name], PA-C  allopurinol (ZYLOPRIM) 100 MG tablet TAKE 1 TABLET BY MOUTH ONCE DAILY. 11/05/20   Birdie Sons, MD  aspirin 81 MG EC tablet Take 1 tablet (81 mg total) by mouth daily. 09/20/20   Thurnell Lose, MD  atorvastatin (LIPITOR) 80 MG tablet TAKE 1 TABLET BY MOUTH ONCE DAILY AT 6PM Patient taking differently: Take 80 mg by mouth every evening. 07/08/20   [name], PA-C  AURYXIA 1 GM 210 MG(Fe) tablet Take 2 tablets (420 mg total) by mouth 3 (three) times daily with meals. 05/03/20   [name], PA-C  B Complex-C-Folic Acid (RENA-VITE RX PO) Take 1 mg by mouth daily.    [provider]  B Complex-C-Folic Acid (RENA-VITE RX) 1 MG TABS Take 1 mg by mouth daily. 10/15/20   [provider]  buPROPion (WELLBUTRIN SR) 150 MG 12 hr tablet Take 1 tablet (150 mg total) by mouth 2 (two) times daily. 05/03/20   [name], PA-C  carvedilol (COREG) 25 MG tablet Take 1 tablet (25 mg total) by mouth 2 (two) times daily with a meal. 10/29/20   Bacigalupo, Dionne Bucy, MD  colchicine 0.6 MG tablet Take 1 tablet (0.6 mg total) by mouth once a week for 15 days. 09/29/20 10/14/20  Thurnell Lose, MD  Continuous Blood Gluc Receiver (FREESTYLE LIBRE 14 DAY READER) DEVI To check blood sugar ACHS 09/27/20   [name], PA-C  Continuous Blood Gluc Sensor (FREESTYLE  LIBRE 14 DAY SENSOR) MISC To check blood sugar ACHS; change every 14 days 09/27/20   [name], PA-C  cyclobenzaprine (FLEXERIL) 10 MG tablet Take 1 tablet (10 mg total) by mouth 2 (two) times daily as needed for muscle spasms. 10/24/20   Corena Herter, PA-C  diclofenac Sodium (VOLTAREN) 1 % GEL Apply 2 g topically 4 (four) times daily. 10/24/20   Corena Herter, PA-C  dicyclomine (BENTYL) 20 MG tablet Take 1 tablet (20 mg total) by mouth in the morning and at bedtime. 09/27/20   [name], PA-C  docusate sodium (COLACE) 100 MG capsule Take 1 capsule (100 mg total) by mouth 2 (two) times daily as needed for mild constipation. 09/20/17   Gouru, Illene Silver, MD  doxepin (SINEQUAN) 10 MG capsule TAKE 1 CAPSULE(10 MG) BY MOUTH FOUR TIMES DAILY AS NEEDED FOR ITCHING Patient taking differently: Take 10 mg by mouth 2 (two) times daily. 05/03/20   [name], PA-C  fluticasone (FLONASE) 50 MCG/ACT nasal spray Place 2 sprays into both nostrils daily as needed for allergies or rhinitis. 09/28/17   [name], PA-C  fluticasone furoate-vilanterol (BREO ELLIPTA) 200-25 MCG/INH AEPB Inhale 1 puff into the lungs daily. 08/30/20   [name], PA-C  hydrALAZINE (APRESOLINE) 100 MG tablet Take 100 mg by mouth 3 (three) times daily. 10/07/20   [provider]  hydrOXYzine (ATARAX/VISTARIL) 25 MG tablet Take 1 tablet (25 mg total) by mouth 4 (four) times daily. 12/03/20   Bacigalupo, Dionne Bucy, MD  insulin detemir (LEVEMIR FLEXTOUCH) 100 UNIT/ML FlexPen Inject 60 Units into the skin 2 (two) times daily. 05/24/20   [name], PA-C  insulin glargine (  LANTUS) 100 UNIT/ML Solostar Pen 25 iu sq qd and increase by 2 iu for every 3 day average greater than 150 03/04/21   Chrismon, Dennis E, PA-C  insulin lispro (HUMALOG KWIKPEN) 100 UNIT/ML KwikPen Inject 25 Units into the skin with breakfast, with lunch, and with evening meal. Patient taking differently: Inject 7  Units into the skin 2 (two) times daily. 05/14/20   [name], PA-C  lidocaine-prilocaine (EMLA) cream Apply 1 application topically as needed. Patient taking differently: Apply 1 application topically as needed (dialysis days). 03/27/19   Schnier, Dolores Lory, MD  loratadine (CLARITIN) 10 MG tablet TAKE 1 TABLET BY MOUTH ONCE DAILY. Patient taking differently: Take 10 mg by mouth daily as needed for allergies. 07/15/20   [name], PA-C  montelukast (SINGULAIR) 10 MG tablet TAKE ONE TABLET BY MOUTH AT BEDTIME. Patient taking differently: Take 10 mg by mouth at bedtime as needed (allergies). 07/15/20   [name], PA-C  nitroGLYCERIN (NITROSTAT) 0.4 MG SL tablet Place 1 tablet (0.4 mg total) under the tongue every 5 (five) minutes x 3 doses as needed for chest pain. 05/03/20   [name], PA-C  Nutritional Supplements (FEEDING SUPPLEMENT, NEPRO CARB STEADY,) LIQD Take 237 mLs by mouth daily. 02/01/20   Loletha Grayer, MD  nystatin cream (MYCOSTATIN) Apply 1 application topically 2 (two) times daily. Patient taking differently: Apply 1 application topically daily as needed for dry skin. 08/10/19   [name], PA-C  omeprazole (PRILOSEC) 20 MG capsule Take 20 mg by mouth daily. 10/07/20   [provider]  oxyCODONE (ROXICODONE) 5 MG immediate release tablet Take 1 tablet (5 mg total) by mouth every 12 (twelve) hours as needed for severe pain. 08/30/20   [name], PA-C  oxyCODONE-acetaminophen (PERCOCET/ROXICET) 5-325 MG tablet Take 1 tablet by mouth 4 (four) times daily as needed for pain. 10/23/20   [provider]  pantoprazole (PROTONIX) 40 MG tablet Take 1 tablet (40 mg total) by mouth 2 (two) times daily. 09/24/20   Thurnell Lose, MD  pregabalin (LYRICA) 150 MG capsule Take 1 capsule (150 mg total) by mouth 2 (two) times daily. 09/27/20   [name], PA-C  SENNA-PLUS 8.6-50 MG tablet TAKE ONE TABLET BY MOUTH AT  BEDTIME. 07/15/20   [name], PA-C  sevelamer carbonate (RENVELA) 800 MG tablet Take 800 mg by mouth 3 (three) times daily. 08/21/20 08/21/21  [provider]  sucralfate (CARAFATE) 1 g tablet Take 1 tablet (1 g total) by mouth 2 (two) times daily for 26 days. 09/24/20 10/20/20  Thurnell Lose, MD  torsemide (DEMADEX) 20 MG tablet TAKE (2) TABLETS BY MOUTH TWICE DAILY. 01/24/21   Virginia Crews, MD  triamcinolone cream (KENALOG) 0.1 % APPLY TO AFFECTED AREA TWICE DAILY Patient taking differently: Apply 1 application topically 2 (two) times daily as needed (rash). 06/17/18   Burnette, Clearnce Sorrel, PA-C  ULTICARE SHORT PEN NEEDLES 31G X 8 MM MISC USE THREE TIMES DAILY WITH INSULIN 01/21/21   Jerrol Banana., MD  valACYclovir (VALTREX) 500 MG tablet TAKE (1) TABLET BY MOUTH EVERY OTHER DAY. 01/24/21   Bacigalupo, Dionne Bucy, MD  VICTOZA 18 MG/3ML SOPN Start with 0.53m daily and increase by 0.63mper week until max dose of 1.8 mg dose achieved. 02/19/21   GiJerrol Banana MD  Vitamin D, Ergocalciferol, (DRISDOL) 1.25 MG (50000 UNIT) CAPS capsule TAKE 1 CAPSULE BY MOUTH ONCE A MONTH 11/04/20  Birdie Sons, MD  gabapentin (NEURONTIN) 100 MG capsule Take 1 capsule (100 mg total) by mouth 3 (three) times daily. 08/19/17 02/03/19  [name], MD    Allergies    Morphine, Shellfish allergy, and Diazepam  Review of Systems   Review of Systems  Constitutional:        Per HPI, otherwise negative  HENT:         Per HPI, otherwise negative  Respiratory:         Per HPI, otherwise negative  Cardiovascular:        Per HPI, otherwise negative  Gastrointestinal:  Negative for vomiting.  Endocrine:       Negative aside from HPI  Genitourinary:        Neg aside from HPI   Musculoskeletal:        Per HPI, otherwise negative  Skin: Negative.   Neurological:  Negative for syncope.   Physical Exam Updated Vital Signs BP (!) 158/82   Pulse 65   Temp 98.1 F (36.7 C)  (Oral)   Resp (!) 9   SpO2 98%   Physical Exam Vitals and nursing note reviewed.  Constitutional:      General: She is not in acute distress.    Appearance: She is well-developed. She is obese.  HENT:     Head: Normocephalic and atraumatic.  Eyes:     Conjunctiva/sclera: Conjunctivae normal.  Cardiovascular:     Rate and Rhythm: Normal rate and regular rhythm.  Pulmonary:     Effort: Pulmonary effort is normal. No respiratory distress.     Breath sounds: Normal breath sounds. No stridor.  Abdominal:     General: There is no distension.  Skin:    General: Skin is warm and dry.  Neurological:     Mental Status: She is alert and oriented to person, place, and time.     Cranial Nerves: No cranial nerve deficit.    ED Results / Procedures / Treatments   Labs (all labs ordered are listed, but only abnormal results are displayed) Labs Reviewed  COMPREHENSIVE METABOLIC PANEL - Abnormal; Notable for the following components:      Result Value   Sodium 133 (*)    Chloride 94 (*)    Glucose, Bld 400 (*)    BUN 56 (*)    Creatinine, Ser 8.42 (*)    Calcium 8.5 (*)    Albumin 3.4 (*)    Alkaline Phosphatase 258 (*)    GFR, Estimated 5 (*)    All other components within normal limits  CBC WITH DIFFERENTIAL/PLATELET - Abnormal; Notable for the following components:   RBC 3.25 (*)    Hemoglobin 10.3 (*)    HCT 32.4 (*)    RDW 16.6 (*)    All other components within normal limits  URINALYSIS, ROUTINE W REFLEX MICROSCOPIC - Abnormal; Notable for the following components:   APPearance HAZY (*)    Glucose, UA >=500 (*)    Protein, ur 30 (*)    Leukocytes,Ua TRACE (*)    Bacteria, UA RARE (*)    All other components within normal limits  CBG MONITORING, ED - Abnormal; Notable for the following components:   Glucose-Capillary 418 (*)    All other components within normal limits    EKG None  Radiology DG Chest 2 View  Result Date: 03/05/2021 CLINICAL DATA:  Weakness.  Back  pain. EXAM: CHEST - 2 VIEW COMPARISON:  01/01/2021 FINDINGS: Mild cardiac enlargement appears unchanged. No  pleural effusion or edema. Bibasilar atelectasis versus scar appears unchanged. No superimposed airspace consolidation. No acute osseous findings. IMPRESSION: Bibasilar atelectasis versus scar. Electronically Signed   By: Kerby Moors M.D.   On: 03/05/2021 15:06    Procedures Procedures   Medications Ordered in ED Medications  insulin aspart (novoLOG) injection 5 Units (5 Units Intravenous Given 03/05/21 2003)    ED Course  I have reviewed the triage vital signs and the nursing notes.  Pertinent labs & imaging results that were available during my care of the patient were reviewed by me and considered in my medical decision making (see chart for details).   EKG reviewed, reassuring, x-Roddy reviewed, reassuring, no hypoxia, low suspicion for occult pneumonia   8:27 PM Patient in no distress.  Initial labs noted, discussed, glucose 400, borderline anion gap.  Patient received insulin here, had no decompensation, had improved condition, was hemodynamically unremarkable, had no changes over her hours of monitoring, was amenable for discharge, was encouraged to follow-up both with primary care and a dialysis tomorrow. Final Clinical Impression(s) / ED Diagnoses Final diagnoses:  Hyperglycemia     Carmin Muskrat, MD 03/05/21 2028

## 2021-03-14 ENCOUNTER — Telehealth: Payer: Self-pay

## 2021-03-14 NOTE — Progress Notes (Signed)
APPOINTMENT REMINDER  Called [name], No answer, left message of appointment on 03/18/2021 at 11:00 am via telephone visit with [name], Pharm D. Notified to have all medications, supplements, blood pressure and blood sugar logs available during appointment and to return call if need to reschedule.  Star Rating Drugs: Atorvastatin 80 mg last filled on 02/18/2021 for 28 day supply at [facility]. Victoza 18 mg/3ML last filled on 02/18/2021 for 30 day supply at [facility].  [place] Pharmacist Assistant [phone]

## 2021-03-18 ENCOUNTER — Telehealth: Payer: Self-pay

## 2021-03-18 ENCOUNTER — Telehealth: Payer: Medicare Other

## 2021-03-18 NOTE — Telephone Encounter (Signed)
Copied from [place] [id number]. Topic: General - Other >> Mar 18, 2021 11:20 AM Tessa Lerner A wrote: Reason for CRM: Patient would like to be contacted by staff member [name] when possible  Please contact further when available

## 2021-03-18 NOTE — Progress Notes (Deleted)
Chronic Care Management Pharmacy Note  03/18/2021 Name:  [name] MRN:  [id number] DOB:  05-May-1956  Summary: ***  Recommendations/Changes made from today's visit: ***  Plan: ***   Subjective: [name] is an 65 y.o. year old female who is a primary patient of [place] team was consulted for assistance with disease management and care coordination needs.    {CCMTELEPHONEFACETOFACE:21091510} for {CCMINITIALFOLLOWUPCHOICE:21091511} in response to provider referral for pharmacy case management and/or care coordination services.   Consent to Services:  {CCMCONSENTOPTIONS:25074}  Patient Care Team: [place] as PCP - Cyndia Diver, MD as PCP - Cardiology (Cardiology) Edrick Kins, DPM as Consulting Physician (Podiatry) Germaine Pomfret, [facility] (Pharmacist)  Recent office visits: ***  Recent consult visits: [facility] visits: {Hospital DC Yes/No:25215}   Objective:  Lab Results  Component Value Date   CREATININE 8.42 (H) 03/05/2021   BUN 56 (H) 03/05/2021   GFR 56.38 (L) 10/11/2014   GFRNONAA 5 (L) 03/05/2021   GFRAA 6 (L) 01/31/2020   NA 133 (L) 03/05/2021   K 4.8 03/05/2021   CALCIUM 8.5 (L) 03/05/2021   CO2 24 03/05/2021   GLUCOSE 400 (H) 03/05/2021    Lab Results  Component Value Date/Time   HGBA1C 12.9 (A) 03/03/2021 02:54 PM   HGBA1C 6.8 (H) 09/27/2020 02:17 PM   HGBA1C 8.4 (H) 09/13/2020 05:56 AM   GFR 56.38 (L) 10/11/2014 10:17 AM   MICROALBUR 237.2 (H) 11/08/2014 10:18 AM    Last diabetic Eye exam:  Lab Results  Component Value Date/Time   HMDIABEYEEXA Retinopathy (A) 10/14/2017 12:00 AM    Last diabetic Foot exam: No results found for: HMDIABFOOTEX   Lab Results  Component Value Date   CHOL 122 09/13/2016   HDL 36 (L) 09/13/2016   LDLCALC 52 09/13/2016   TRIG 168 (H) 09/13/2016   CHOLHDL 3.4 09/13/2016    Hepatic Function Latest Ref Rng & Units 03/05/2021 12/31/2020  09/27/2020  Total Protein 6.5 - 8.1 g/dL 7.1 7.9 6.8  Albumin 3.5 - 5.0 g/dL 3.4(L) 3.3(L) 4.0  AST 15 - 41 U/L 32 32 41(H)  ALT 0 - 44 U/L 36 37 36(H)  Alk Phosphatase 38 - 126 U/L 258(H) 430(H) 299(H)  Total Bilirubin 0.3 - 1.2 mg/dL 0.6 0.6 0.4  Bilirubin, Direct 0.0 - 0.2 mg/dL - - -    Lab Results  Component Value Date/Time   TSH 2.096 01/31/2020 01:48 AM   TSH 1.560 12/28/2018 11:16 PM   TSH 1.254 03/26/2012 12:25 PM   TSH 3.858 ***Test methodology is 3rd generation TSH*** 04/05/2008 10:20 PM    CBC Latest Ref Rng & Units 03/05/2021 12/31/2020 09/27/2020  WBC 4.0 - 10.5 K/uL 5.3 6.5 5.5  Hemoglobin 12.0 - 15.0 g/dL 10.3(L) 9.6(L) 9.7(L)  Hematocrit 36.0 - 46.0 % 32.4(L) 28.9(L) 28.9(L)  Platelets 150 - 400 K/uL 163 139(L) 209    No results found for: VD25OH  Clinical ASCVD: {YES/NO:21197} The ASCVD Risk score Mikey Bussing DC Jr., et al., 2013) failed to calculate for the following reasons:   The valid total cholesterol range is 130 to 320 mg/dL    Depression screen [facility] 2/9 03/03/2021 06/28/2020 05/31/2020  Decreased Interest 2 1 3   Down, Depressed, Hopeless 0 1 2  PHQ - 2 Score 2 2 5   Altered sleeping 3 1 2   Tired, decreased energy 3 1 3   Change in appetite 2 1 3   Feeling bad or failure  about yourself  0 3 1  Trouble concentrating 1 1 3   Moving slowly or fidgety/restless 1 1 3   Suicidal thoughts 0 0 0  PHQ-9 Score 12 10 20   Difficult doing work/chores Somewhat difficult Not difficult at all Very difficult  Some recent data might be hidden     ***Other: (CHADS2VASc if Afib, MMRC or CAT for COPD, ACT, DEXA)  Social History   Tobacco Use  Smoking Status Former   Packs/day: 0.25   Years: 6.00   Pack years: 1.50   Types: Cigarettes   Quit date: 10/25/1980   Years since quitting: 40.4  Smokeless Tobacco Never   BP Readings from Last 3 Encounters:  03/05/21 (!) 171/89  03/03/21 (!) 94/47  01/01/21 (!) 155/71   Pulse Readings from Last 3 Encounters:  03/05/21 68   03/03/21 76  01/01/21 69   Wt Readings from Last 3 Encounters:  11/19/20 279 lb (126.6 kg)  09/27/20 275 lb 8 oz (125 kg)  09/23/20 277 lb 12.5 oz (126 kg)   BMI Readings from Last 3 Encounters:  11/19/20 43.05 kg/m  09/27/20 41.89 kg/m  09/23/20 42.24 kg/m    Assessment/Interventions: Review of patient past medical history, allergies, medications, health status, including review of consultants reports, laboratory and other test data, was performed as part of comprehensive evaluation and provision of chronic care management services.   SDOH:  (Social Determinants of Health) assessments and interventions performed: {yes/no:20286}  SDOH Screenings   Alcohol Screen: Low Risk    Last Alcohol Screening Score (AUDIT): 0  Depression (PHQ2-9): Medium Risk   PHQ-2 Score: 12  Financial Resource Strain: Not on file  Food Insecurity: Not on file  Housing: Not on file  Physical Activity: Not on file  Social Connections: Not on file  Stress: Not on file  Tobacco Use: Medium Risk   Smoking Tobacco Use: Former   Smokeless Tobacco Use: Never  Transportation Needs: Not on file    [place]  Allergies  Allergen Reactions   Morphine Itching    Tolerated hydromorphone on 07/2020   Shellfish Allergy Anaphylaxis and Swelling   Diazepam Other (See Comments)    "felt like out of body experience"    Medications Reviewed Today     Reviewed by Margo Common, PA-C (Physician Assistant) on 03/05/21 at [place] List Status: <None>   Medication Order Taking? Sig Documenting Provider Last Dose Status Informant  albuterol (VENTOLIN HFA) 108 (90 Base) MCG/ACT inhaler [id number] Yes Inhale 2 puffs into the lungs every 4 (four) hours as needed for wheezing or shortness of breath. [name], PA-C Taking Active Self  allopurinol (ZYLOPRIM) 100 MG tablet [id number] Yes TAKE 1 TABLET BY MOUTH ONCE DAILY. Birdie Sons, MD Taking Active   aspirin 81 MG EC tablet [id number] Yes  Take 1 tablet (81 mg total) by mouth daily. Thurnell Lose, MD Taking Active   atorvastatin (LIPITOR) 80 MG tablet [id number] Yes TAKE 1 TABLET BY MOUTH ONCE DAILY AT 6PM  Patient taking differently: Take 80 mg by mouth every evening.   [name] M, PA-C Taking Active   AURYXIA 1 GM 210 MG(Fe) tablet [id number]  Take 2 tablets (420 mg total) by mouth 3 (three) times daily with meals. [name], PA-C  Consider Medication Status and Discontinue (Completed Course) Self  B Complex-C-Folic Acid (RENA-VITE RX PO) [id number]  Take 1 mg by mouth daily. [provider]  Consider Medication Status and Discontinue (Completed Course) Self  B Complex-C-Folic Acid (RENA-VITE RX) 1 MG TABS [id number]  Take 1 mg by mouth daily. [provider]  Consider Medication Status and Discontinue (Completed Course)   buPROPion (WELLBUTRIN SR) 150 MG 12 hr tablet [id number]  Take 1 tablet (150 mg total) by mouth 2 (two) times daily. [name], PA-C  Consider Medication Status and Discontinue (Completed Course) Self  carvedilol (COREG) 25 MG tablet [id number] Yes Take 1 tablet (25 mg total) by mouth 2 (two) times daily with a meal. Brita Romp Dionne Bucy, MD Taking Active   colchicine 0.6 MG tablet [id number]  Take 1 tablet (0.6 mg total) by mouth once a week for 15 days. Thurnell Lose, MD  Expired 10/14/20 2359   Continuous Blood Gluc Receiver (FREESTYLE LIBRE 14 DAY READER) [place] [id number] Yes To check blood sugar ACHS [name], PA-C Taking Active   Continuous Blood Gluc Sensor (FREESTYLE LIBRE 14 DAY SENSOR) [place] [id number] Yes To check blood sugar ACHS; change every 14 days [name] M, PA-C Taking Active   cyclobenzaprine (FLEXERIL) 10 MG tablet [id number] Yes Take 1 tablet (10 mg total) by mouth 2 (two) times daily as needed for muscle spasms. Corena Herter, PA-C Taking Active   diclofenac Sodium (VOLTAREN) 1 % GEL [id number] Yes Apply 2 g topically 4 (four)  times daily. Corena Herter, PA-C Taking Active   dicyclomine (BENTYL) 20 MG tablet [id number] Yes Take 1 tablet (20 mg total) by mouth in the morning and at bedtime. [name] M, PA-C Taking Active   docusate sodium (COLACE) 100 MG capsule [id number]  Take 1 capsule (100 mg total) by mouth 2 (two) times daily as needed for mild constipation. Nicholes Mango, MD  Consider Medication Status and Discontinue (Completed Course) Self  doxepin (SINEQUAN) 10 MG capsule [id number] Yes TAKE 1 CAPSULE(10 MG) BY MOUTH FOUR TIMES DAILY AS NEEDED FOR ITCHING  Patient taking differently: Take 10 mg by mouth 2 (two) times daily.   [name] M, PA-C Taking Active   fluticasone [facility]) 50 MCG/ACT nasal spray [id number] Yes Place 2 sprays into both nostrils daily as needed for allergies or rhinitis. [name], PA-C Taking Active Self  fluticasone furoate-vilanterol (BREO ELLIPTA) 200-25 MCG/INH AEPB [id number] Yes Inhale 1 puff into the lungs daily. [name], [place] Taking Active Self    Discontinued 09/01/17 1623 (Dose change)   hydrALAZINE (APRESOLINE) 100 MG tablet [id number]  Take 100 mg by mouth 3 (three) times daily. [provider]  Consider Medication Status and Discontinue (Completed Course)   hydrOXYzine (ATARAX/VISTARIL) 25 MG tablet [id number] Yes Take 1 tablet (25 mg total) by mouth 4 (four) times daily. Virginia Crews, MD Taking Active   insulin detemir (LEVEMIR FLEXTOUCH) 100 UNIT/ML FlexPen [id number]  Inject 60 Units into the skin 2 (two) times daily. [name] M, PA-C  Active Self  insulin glargine (LANTUS) 100 UNIT/ML Solostar Pen [id number]  25 iu sq qd and increase by 2 iu for every 3 day average greater than 150 Chrismon, Vickki Muff, PA-C  Active   insulin lispro (HUMALOG KWIKPEN) 100 UNIT/ML KwikPen [id number] Yes Inject 25 Units into the skin with breakfast, with lunch, and with evening meal.  Patient taking differently: Inject 7 Units into the  skin 2 (two) times daily.   [name] M, PA-C Taking Active   lidocaine-prilocaine (EMLA) cream [id number] Yes Apply 1 application topically as needed.  Patient taking differently: Apply 1 application topically as needed (dialysis days).   Schnier, Belenda Cruise  G, MD Taking Active   loratadine (CLARITIN) 10 MG tablet [id number]  TAKE 1 TABLET BY MOUTH ONCE DAILY.  Patient taking differently: Take 10 mg by mouth daily as needed for allergies.   [name], PA-C  Consider Medication Status and Discontinue (Completed Course)   montelukast (SINGULAIR) 10 MG tablet [id number]  TAKE ONE TABLET BY MOUTH AT BEDTIME.  Patient taking differently: Take 10 mg by mouth at bedtime as needed (allergies).   [name], PA-C  Consider Medication Status and Discontinue (Completed Course)   nitroGLYCERIN (NITROSTAT) 0.4 MG SL tablet [id number] Yes Place 1 tablet (0.4 mg total) under the tongue every 5 (five) minutes x 3 doses as needed for chest pain. [name], PA-C Taking Active Self  Nutritional Supplements (FEEDING SUPPLEMENT, NEPRO CARB STEADY,) LIQD [id number] Yes Take 237 mLs by mouth daily. Loletha Grayer, MD Taking Active Self  nystatin cream (MYCOSTATIN) [id number] Yes Apply 1 application topically 2 (two) times daily.  Patient taking differently: Apply 1 application topically daily as needed for dry skin.   [name], PA-C Taking Active   omeprazole (PRILOSEC) 20 MG capsule [id number] No Take 20 mg by mouth daily. [provider] Unknown Active   oxyCODONE (ROXICODONE) 5 MG immediate release tablet [id number] Yes Take 1 tablet (5 mg total) by mouth every 12 (twelve) hours as needed for severe pain. [name], PA-C Taking Active Self  oxyCODONE-acetaminophen (PERCOCET/ROXICET) 5-325 MG tablet [id number] Yes Take 1 tablet by mouth 4 (four) times daily as needed for pain. [provider] Taking Active   pantoprazole (PROTONIX) 40 MG tablet  [id number] No Take 1 tablet (40 mg total) by mouth 2 (two) times daily. Thurnell Lose, MD Unknown Active   pregabalin (LYRICA) 150 MG capsule [id number] Yes Take 1 capsule (150 mg total) by mouth 2 (two) times daily. Rubye [place] Taking Active   SENNA-PLUS 8.6-50 MG tablet [id number]  TAKE ONE TABLET BY MOUTH AT BEDTIME. [name], PA-C  Consider Medication Status and Discontinue (Completed Course) Self  sevelamer carbonate (RENVELA) 800 MG tablet [id number] Yes Take 800 mg by mouth 3 (three) times daily. [provider] Taking Active Self  sucralfate (CARAFATE) 1 g tablet [id number]  Take 1 tablet (1 g total) by mouth 2 (two) times daily for 26 days. Thurnell Lose, MD  Expired 10/20/20 2359   torsemide (DEMADEX) 20 MG tablet [id number]  TAKE (2) TABLETS BY MOUTH TWICE DAILY. Virginia Crews, MD  Consider Medication Status and Discontinue (Completed Course)   triamcinolone cream (KENALOG) 0.1 % [id number]  APPLY TO AFFECTED AREA TWICE DAILY  Patient taking differently: Apply 1 application topically 2 (two) times daily as needed (rash).   [name], PA-C  Consider Medication Status and Discontinue (Completed Course)   ULTICARE SHORT PEN NEEDLES 31G X 8 MM MISC [id number] Yes USE THREE TIMES DAILY WITH INSULIN Jerrol Banana., MD Taking Active   valACYclovir (VALTREX) 500 MG tablet [id number]  TAKE (1) TABLET BY MOUTH EVERY OTHER DAY. Virginia Crews, MD  Consider Medication Status and Discontinue   VICTOZA 18 MG/3ML SOPN [id number] Yes Start with 0.$RemoveBefo'6mg'uBeOhnsETOJ$  daily and increase by 0.$RemoveBefor'6mg'YBjXmekzYskX$  per week until max dose of 1.8 mg dose achieved. Jerrol Banana., MD Taking Active   Vitamin D, Ergocalciferol, (DRISDOL) 1.25 MG (50000 UNIT) CAPS capsule [id number]  TAKE 1 CAPSULE BY MOUTH ONCE A MONTH Fisher, Kirstie Peri, MD  Consider Medication Status and Discontinue (Completed Course)  Med List Note Gentry Roch, CPhT 09/18/19 6808): Dialysis Tues, Thurs  Saturday            Patient Active Problem List   Diagnosis Date Noted   Upper GI bleed 09/17/2020   Prolonged QT interval 09/17/2020   COPD (chronic obstructive pulmonary disease) ([place]) 09/17/2020   Acute pulmonary edema (HCC) 09/14/2020   Acute respiratory failure with hypoxia ([place]) 09/13/2020   Allergy, unspecified, initial encounter 03/29/2020   Anaphylactic shock, unspecified, initial encounter 03/29/2020   Volume overload 01/30/2020   Pain in left upper arm 01/18/2020   Cellulitis 01/16/2020   Anemia due to end stage renal disease ([place]) 01/16/2020   Diabetic polyneuropathy associated with type 2 diabetes mellitus ([place]) 11/06/2019   Nystagmus 11/06/2019   Intermittent claudication ([place]) 11/06/2019   Vertigo of central origin 11/06/2019   Weakness    Hypervolemia associated with renal insufficiency 10/31/2019   BMI 45.0-49.9, adult ([place]) 10/18/2019   Ascending aortic aneurysm ([place]) 03/27/2019   Puncture wound without foreign body of left forearm, initial encounter 01/02/2019   Non-healing wound of upper extremity 12/28/2018   Unspecified open wound of left upper arm, initial encounter 81/04/3158   Complication from renal dialysis device 10/20/2018   Iron deficiency anemia, unspecified 10/18/2018   ESRD (end stage renal disease) on dialysis ([place]) 06/01/2018   ARF (acute renal failure) ([place]) 05/09/2018   Colon cancer screening    LGSIL on Pap smear of cervix 01/27/2018   Gout 12/25/2017   AKI (acute kidney injury) ([place]) 12/24/2017   Hyperglycemia 12/24/2017   Chronic diastolic heart failure ([place]) 09/30/2017   Lymphedema 09/30/2017   Aortic stenosis    Acute pain of both knees 02/12/2017   Chronic gout due to renal impairment of multiple sites without tophus 02/12/2017   Esophageal dysphagia 02/12/2017   Osteoarthritis 01/22/2017   Diabetic hyperosmolar non-ketotic state ([place]) 12/13/2016   CAD (coronary artery disease) 12/13/2016   Hyperlipidemia 12/13/2016    Hyperphosphatemia 45/85/9292   Acute diastolic CHF (congestive heart failure) (HCC)    Hyperkalemia 08/21/2016   Asthma 11/29/2015   Depression 09/05/2015   GERD (gastroesophageal reflux disease) 03/25/2015   Environmental allergies 03/14/2015   SOB (shortness of breath) 02/15/2015   Chronic hepatitis C without hepatic coma ([place]) 01/31/2015   Diabetic neuropathy ([place]) 01/31/2015   OSA (obstructive sleep apnea) 01/01/2015   Poor dentition 11/13/2014   Essential hypertension 10/08/2014   Morbid (severe) obesity due to excess calories ([place]) 10/08/2014   Localized, primary osteoarthritis 01/25/2014   Family history of diabetes mellitus 03/31/2013   Pain in limb 03/31/2013   Uncontrolled type 2 diabetes mellitus with hyperosmolar nonketotic hyperglycemia ([place]) 03/25/2012    Class: Chronic    Immunization History  Administered Date(s) Administered   Hepatitis B, adult 06/30/2018, 07/28/2018, 09/01/2018   Hepatitis B, ped/adol 06/30/2018, 07/28/2018, 09/01/2018   Influenza Split 03/27/2012   Influenza,inj,Quad PF,6+ Mos 10/03/2014, 04/08/2015, 05/19/2016, 04/29/2018, 04/06/2019, 05/31/2020   Moderna Sars-Covid-2 Vaccination 08/20/2020   Pneumococcal Conjugate-13 07/07/2018   Pneumococcal Polysaccharide-23 03/26/2012, 09/20/2018   Tdap 06/18/2014    Conditions to be addressed/monitored:  Hypertension, Hyperlipidemia, Diabetes, Heart Failure, Coronary Artery Disease, COPD, and End Stage Renal Disease on Dialysis   There are no care plans that you recently modified to display for this patient.    Medication Assistance: {MEDASSISTANCEINFO:25044}  Compliance/Adherence/Medication fill history: Care Gaps: ***  Star-Rating Drugs: ***  Patient's preferred pharmacy is:  [facility] [place], [place] [address] AT [address]  [place] [address] Phone: [phone] Fax: [place], [place] - [address] [place] [address] Phone: [phone] Fax: [phone]  Uses pill box? {Yes or If no, why not?:20788} Pt endorses ***% compliance  We discussed: {Pharmacy options:24294} Patient decided to: {US Pharmacy Plan:23885}  Care Plan and Follow Up Patient Decision:  {FOLLOWUP:24991}  Plan: {CM FOLLOW UP KLKJ:17915}  ***  Current Barriers:  {pharmacybarriers:24917}  Pharmacist Clinical Goal(s):  Patient will {PHARMACYGOALCHOICES:24921} through collaboration with PharmD and provider.   Interventions: 1:1 collaboration with [place] regarding development and update of comprehensive plan of care as evidenced by provider attestation and co-signature Inter-disciplinary care team collaboration (see longitudinal plan of care) Comprehensive medication review performed; medication list updated in electronic medical record  Heart Failure (Goal: manage symptoms and prevent exacerbations) -{US controlled/uncontrolled:25276} -Last ejection fraction: *** (Date: ***) -HF type: {type of heart failure:30421350} -NYHA Class: {CHL HP Upstream Pharm NYHA Class:[phone]} -AHA HF Stage: {CHL HP Upstream Pharm AHA HF Stage:[phone]} -Current treatment: Carvedilol 25 mg twice daily  Hydralazine 100 mg three times daily  Torsemide 20 mg 2 tablets twice daily  -Medications previously tried: ***  -Current home BP/HR readings: *** -Current dietary habits: *** -Current exercise habits: *** -Educated on {CCM HF Counseling:25125} -{CCMPHARMDINTERVENTION:25122}   Hyperlipidemia: (LDL goal < ***) -{US controlled/uncontrolled:25276} -Current treatment: Atorvastatin 80 mg daily  -Medications previously tried: ***  -Current dietary patterns: *** -Current exercise habits: *** -Educated on {CCM HLD Counseling:25126} -{CCMPHARMDINTERVENTION:25122}  Diabetes (A1c goal {A1c goals:23924}) -{US controlled/uncontrolled:25276} -Current medications: Lantus 25  units daily  Levemir 60 units twice daily  Humalog 25 units three times daily with meals  V[place]oza  -Medications previously tried: ***  -Current home glucose readings fasting glucose: *** post prandial glucose: *** -{ACTIONS;DENIES/REPORTS:21021675::"Denies"} hypoglycemic/hyperglycemic symptoms -Current meal patterns:  breakfast: ***  lunch: ***  dinner: *** snacks: *** drinks: *** -Current exercise: *** -Educated on {CCM DM COUNSELING:25123} -Counseled to check feet daily and get yearly eye exams -{CCMPHARMDINTERVENTION:25122}  Patient Goals/Self-Care Activities Patient will:  - {pharmacypatientgoals:24919}  Follow Up Plan: {CM FOLLOW UP AVWP:79480}

## 2021-03-19 ENCOUNTER — Telehealth: Payer: Self-pay | Admitting: Family Medicine

## 2021-03-19 NOTE — Telephone Encounter (Signed)
[facility] faxed refill request for the following medications:    ERROR    Please advise.

## 2021-03-24 ENCOUNTER — Other Ambulatory Visit: Payer: Self-pay

## 2021-03-24 ENCOUNTER — Telehealth: Payer: Medicare Other

## 2021-03-24 ENCOUNTER — Emergency Department (HOSPITAL_COMMUNITY): Payer: Medicare Other

## 2021-03-24 ENCOUNTER — Emergency Department (HOSPITAL_COMMUNITY)
Admission: EM | Admit: 2021-03-24 | Discharge: 2021-03-24 | Disposition: A | Discharge status: Home / Self Care | Payer: Medicare Other | Attending: Emergency Medicine | Admitting: Emergency Medicine

## 2021-03-24 ENCOUNTER — Encounter (HOSPITAL_COMMUNITY): Payer: Self-pay

## 2021-03-24 ENCOUNTER — Emergency Department (HOSPITAL_BASED_OUTPATIENT_CLINIC_OR_DEPARTMENT_OTHER): Payer: Medicare Other

## 2021-03-24 DIAGNOSIS — J449 Chronic obstructive pulmonary disease, unspecified: Secondary | ICD-10-CM | POA: Diagnosis not present

## 2021-03-24 DIAGNOSIS — Z7982 Long term (current) use of aspirin: Secondary | ICD-10-CM | POA: Insufficient documentation

## 2021-03-24 DIAGNOSIS — M79606 Pain in leg, unspecified: Secondary | ICD-10-CM

## 2021-03-24 DIAGNOSIS — I2510 Atherosclerotic heart disease of native coronary artery without angina pectoris: Secondary | ICD-10-CM | POA: Diagnosis not present

## 2021-03-24 DIAGNOSIS — Z794 Long term (current) use of insulin: Secondary | ICD-10-CM | POA: Insufficient documentation

## 2021-03-24 DIAGNOSIS — Z87891 Personal history of nicotine dependence: Secondary | ICD-10-CM | POA: Diagnosis not present

## 2021-03-24 DIAGNOSIS — Z7984 Long term (current) use of oral hypoglycemic drugs: Secondary | ICD-10-CM | POA: Insufficient documentation

## 2021-03-24 DIAGNOSIS — I5042 Chronic combined systolic (congestive) and diastolic (congestive) heart failure: Secondary | ICD-10-CM | POA: Insufficient documentation

## 2021-03-24 DIAGNOSIS — E1122 Type 2 diabetes mellitus with diabetic chronic kidney disease: Secondary | ICD-10-CM | POA: Insufficient documentation

## 2021-03-24 DIAGNOSIS — J45909 Unspecified asthma, uncomplicated: Secondary | ICD-10-CM | POA: Diagnosis not present

## 2021-03-24 DIAGNOSIS — Z7951 Long term (current) use of inhaled steroids: Secondary | ICD-10-CM | POA: Diagnosis not present

## 2021-03-24 DIAGNOSIS — E1140 Type 2 diabetes mellitus with diabetic neuropathy, unspecified: Secondary | ICD-10-CM | POA: Insufficient documentation

## 2021-03-24 DIAGNOSIS — M79605 Pain in left leg: Secondary | ICD-10-CM | POA: Diagnosis not present

## 2021-03-24 DIAGNOSIS — Z20822 Contact with and (suspected) exposure to covid-19: Secondary | ICD-10-CM | POA: Diagnosis not present

## 2021-03-24 DIAGNOSIS — N186 End stage renal disease: Secondary | ICD-10-CM | POA: Diagnosis not present

## 2021-03-24 DIAGNOSIS — L539 Erythematous condition, unspecified: Secondary | ICD-10-CM | POA: Insufficient documentation

## 2021-03-24 DIAGNOSIS — Z992 Dependence on renal dialysis: Secondary | ICD-10-CM | POA: Diagnosis not present

## 2021-03-24 LAB — COMPREHENSIVE METABOLIC PANEL
ALT: 45 U/L — ABNORMAL HIGH (ref 0–44)
AST: 46 U/L — ABNORMAL HIGH (ref 15–41)
Albumin: 3.7 g/dL (ref 3.5–5.0)
Alkaline Phosphatase: 309 U/L — ABNORMAL HIGH (ref 38–126)
Anion gap: 19 — ABNORMAL HIGH (ref 5–15)
BUN: 48 mg/dL — ABNORMAL HIGH (ref 8–23)
CO2: 24 mmol/L (ref 22–32)
Calcium: 9.8 mg/dL (ref 8.9–10.3)
Chloride: 97 mmol/L — ABNORMAL LOW (ref 98–111)
Creatinine, Ser: 8.64 mg/dL — ABNORMAL HIGH (ref 0.44–1.00)
GFR, Estimated: 5 mL/min — ABNORMAL LOW (ref >60)
Glucose, Bld: 357 mg/dL — ABNORMAL HIGH (ref 70–99)
Potassium: 5 mmol/L (ref 3.5–5.1)
Sodium: 140 mmol/L (ref 135–145)
Total Bilirubin: 0.6 mg/dL (ref 0.3–1.2)
Total Protein: 7.8 g/dL (ref 6.5–8.1)

## 2021-03-24 LAB — CBC WITH DIFFERENTIAL/PLATELET
Abs Immature Granulocytes: 0.09 10*3/uL — ABNORMAL HIGH (ref 0.00–0.07)
Basophils Absolute: 0 10*3/uL (ref 0.0–0.1)
Basophils Relative: 1 %
Eosinophils Absolute: 0.3 10*3/uL (ref 0.0–0.5)
Eosinophils Relative: 4 %
HCT: 36.2 % (ref 36.0–46.0)
Hemoglobin: 11.6 g/dL — ABNORMAL LOW (ref 12.0–15.0)
Immature Granulocytes: 1 %
Lymphocytes Relative: 23 %
Lymphs Abs: 1.7 10*3/uL (ref 0.7–4.0)
MCH: 32 pg (ref 26.0–34.0)
MCHC: 32 g/dL (ref 30.0–36.0)
MCV: 99.7 fL (ref 80.0–100.0)
Monocytes Absolute: 0.7 10*3/uL (ref 0.1–1.0)
Monocytes Relative: 9 %
Neutro Abs: 4.7 10*3/uL (ref 1.7–7.7)
Neutrophils Relative %: 62 %
Platelets: 162 10*3/uL (ref 150–400)
RBC: 3.63 MIL/uL — ABNORMAL LOW (ref 3.87–5.11)
RDW: 15.7 % — ABNORMAL HIGH (ref 11.5–15.5)
WBC: 7.5 10*3/uL (ref 4.0–10.5)
nRBC: 0 % (ref 0.0–0.2)

## 2021-03-24 LAB — LACTIC ACID, PLASMA: Lactic Acid, Venous: 2 mmol/L (ref 0.5–1.9)

## 2021-03-24 LAB — RESP PANEL BY RT-PCR (FLU A&B, COVID) ARPGX2
Influenza A by PCR: NEGATIVE
Influenza B by PCR: NEGATIVE
SARS Coronavirus 2 by RT PCR: NEGATIVE

## 2021-03-24 LAB — LIPASE, BLOOD: Lipase: 59 U/L — ABNORMAL HIGH (ref 11–51)

## 2021-03-24 LAB — BRAIN NATRIURETIC PEPTIDE: B Natriuretic Peptide: 48.5 pg/mL (ref 0.0–100.0)

## 2021-03-24 LAB — CBG MONITORING, ED: Glucose-Capillary: 318 mg/dL — ABNORMAL HIGH (ref 70–99)

## 2021-03-24 MED ORDER — OXYCODONE-ACETAMINOPHEN 5-325 MG PO TABS
1.0000 | ORAL_TABLET | Freq: Once | ORAL | Status: AC
  Administered 2021-03-24: 1 via ORAL
  Filled 2021-03-24: qty 1

## 2021-03-24 NOTE — Progress Notes (Signed)
Lower extremity venous bilateral study completed.  Preliminary results relayed to [place], [place] and [place], [place].  See CV Proc for preliminary results report.   Darlin Coco, RDMS, RVT

## 2021-03-24 NOTE — Discharge Instructions (Addendum)
Your laboratory results with her baseline today.  Your x-[place] did not show any acute findings.  The ultrasound of your left leg was negative for any DVT.  Please continue to follow-up with your primary care physician as needed.

## 2021-03-24 NOTE — ED Notes (Signed)
Unable to obtain a second set of blood cultures at this time.

## 2021-03-24 NOTE — ED Triage Notes (Addendum)
Patient reports that she has left knee pain that started last night. Patient denies any injury. Patient states she tried to get out of bed last night and now has numbness from the left foot to the left knee.  Patient states she was told that she needed bilateral knee replacements.   Patient is lethargic and states she is a dialysis patient. Patient is due for dialysis tomorrow.

## 2021-03-24 NOTE — ED Provider Notes (Signed)
[facility] DEPT Provider Note   CSN: [id number] Arrival date & time: 03/24/21  1330     History Chief Complaint  Patient presents with   Knee Pain    Gerardo AKEELAH SEPPALA is a 65 y.o. female.  65 y.o female with a  PMH of ESRD on dialysis THS, CHF, DM, MI presents to the ED with a chief complaint of left leg pain x yesterday. Endorses some of her pain is harp and stabbing along the left calf and radiates behind her left knee. She also reports pain is exacerbated with movement and ambulation. She has tried taking a percocet without much improvement in symptoms. She also reports increase drowsiness. She states the left leg feels somewhat numb throughout. She denies any trauma, fever, shortness of breath, or URI symptoms.   The history is provided by the patient and medical records.  Knee Pain Location:  Leg Time since incident:  1 day Injury: no   Leg location:  L leg Pain details:    Quality:  Sharp   Radiates to:  Does not radiate   Severity:  Mild   Onset quality:  Gradual   Duration:  1 day   Timing:  Constant   Progression:  Unchanged Chronicity:  New Dislocation: no   Associated symptoms: no fever   Risk factors: obesity       Past Medical History:  Diagnosis Date   Anemia    Aortic stenosis    Echo 8/18: mean 13, peak 28, LVOT/AV mean velocity 0.51   Arthritis    Asthma    As a child    Bronchitis    CAD (coronary artery disease)    a. 09/2016: 50% Ost 1st Mrg stenosis, 50% 2nd Mrg stenosis, 20% Mid-Cx, 95% Prox LAD, 40% mid-LAD, and 10% dist-LAD stenosis. Staged PCI with DES to Prox-LAD.    Chronic combined systolic and diastolic CHF (congestive heart failure) ([place]) 2011   echo 2/18: EF 55-60, normal wall motion, grade 2 diastolic dysfunction, trivial AI // echo 3/18: Septal and apical HK, EF 45-50, normal wall motion, trivial AI, mild LAE, PASP 38 // echo 8/18: EF 60-65, normal wall motion, grade 1 diastolic dysfunction, calcified  aortic valve leaflets, mild aortic stenosis (mean 13, peak 28, LVOT/AV mean velocity 0.51), mild AI, moderate MAC, mild LAE, trivial TR    Chronic kidney disease    STAGE 4   Chronic kidney disease on chronic dialysis (HCC)    t, th, sat   Complication of anesthesia    Depression    Diabetes mellitus Dx 1989   Elevated lipids    GERD (gastroesophageal reflux disease)    Gout    Heart murmur    asymptomatic   Hepatitis C Dx 2013   Hypertension Dx 1989   Infected surgical wound    Lt arm   Myocardial infarction ([place]) 07/2015   Obesity    Pancreatitis 2013   Pneumonia    Refusal of blood transfusions as patient is Jehovah's Witness    pt states she is not [place] witness and does not refuse blood products   Tendinitis    Tremors of nervous system    LEFT HAND   Ulcer 2010    Patient Active Problem List   Diagnosis Date Noted   Upper GI bleed 09/17/2020   Prolonged QT interval 09/17/2020   COPD (chronic obstructive pulmonary disease) ([place]) 09/17/2020   Acute pulmonary edema ([place]) 09/14/2020   Acute respiratory failure with hypoxia (  [place]) 09/13/2020   Allergy, unspecified, initial encounter 03/29/2020   Anaphylactic shock, unspecified, initial encounter 03/29/2020   Volume overload 01/30/2020   Pain in left upper arm 01/18/2020   Cellulitis 01/16/2020   Anemia due to end stage renal disease ([place]) 01/16/2020   Diabetic polyneuropathy associated with type 2 diabetes mellitus ([place]) 11/06/2019   Nystagmus 11/06/2019   Intermittent claudication ([place]) 11/06/2019   Vertigo of central origin 11/06/2019   Weakness    Hypervolemia associated with renal insufficiency 10/31/2019   BMI 45.0-49.9, adult ([place]) 10/18/2019   Ascending aortic aneurysm ([place]) 03/27/2019   Puncture wound without foreign body of left forearm, initial encounter 01/02/2019   Non-healing wound of upper extremity 12/28/2018   Unspecified open wound of left upper arm, initial encounter 40/76/8088   Complication  from renal dialysis device 10/20/2018   Iron deficiency anemia, unspecified 10/18/2018   ESRD (end stage renal disease) on dialysis ([place]) 06/01/2018   ARF (acute renal failure) ([place]) 05/09/2018   Colon cancer screening    LGSIL on Pap smear of cervix 01/27/2018   Gout 12/25/2017   AKI (acute kidney injury) ([place]) 12/24/2017   Hyperglycemia 12/24/2017   Chronic diastolic heart failure ([place]) 09/30/2017   Lymphedema 09/30/2017   Aortic stenosis    Acute pain of both knees 02/12/2017   Chronic gout due to renal impairment of multiple sites without tophus 02/12/2017   Esophageal dysphagia 02/12/2017   Osteoarthritis 01/22/2017   Diabetic hyperosmolar non-ketotic state ([place]) 12/13/2016   CAD (coronary artery disease) 12/13/2016   Hyperlipidemia 12/13/2016   Hyperphosphatemia 05/15/1593   Acute diastolic CHF (congestive heart failure) (HCC)    Hyperkalemia 08/21/2016   Asthma 11/29/2015   Depression 09/05/2015   GERD (gastroesophageal reflux disease) 03/25/2015   Environmental allergies 03/14/2015   SOB (shortness of breath) 02/15/2015   Chronic hepatitis C without hepatic coma ([place]) 01/31/2015   Diabetic neuropathy ([place]) 01/31/2015   OSA (obstructive sleep apnea) 01/01/2015   Poor dentition 11/13/2014   Essential hypertension 10/08/2014   Morbid (severe) obesity due to excess calories ([place]) 10/08/2014   Localized, primary osteoarthritis 01/25/2014   Family history of diabetes mellitus 03/31/2013   Pain in limb 03/31/2013   Uncontrolled type 2 diabetes mellitus with hyperosmolar nonketotic hyperglycemia ([place]) 03/25/2012    Class: Chronic    Past Surgical History:  Procedure Laterality Date   A/V FISTULAGRAM Left 04/11/2019   Procedure: A/V FISTULAGRAM;  Surgeon: Katha Cabal, MD;  Location: [facility];  Service: Cardiovascular;  Laterality: Left;   A/V FISTULAGRAM Left 06/02/2019   Procedure: A/V FISTULAGRAM;  Surgeon: Katha Cabal, MD;  Location: [facility];  Service: Cardiovascular;  Laterality: Left;   APPLICATION OF WOUND VAC Left 08/14/2017   Procedure: APPLICATION OF WOUND VAC Exchange;  Surgeon: Robert Bellow, MD;  Location: [facility];  Service: General;  Laterality: Left;   APPLICATION OF WOUND VAC Left 12/21/2018   Procedure: APPLICATION OF WOUND VAC;  Surgeon: Katha Cabal, MD;  Location: [facility];  Service: Vascular;  Laterality: Left;   AV FISTULA PLACEMENT Left 08/19/2018   Procedure: ARTERIOVENOUS (AV) FISTULA CREATION ( BRACHIOBASILIC );  Surgeon: Katha Cabal, MD;  Location: [facility];  Service: Vascular;  Laterality: Left;   BASCILIC VEIN TRANSPOSITION Left 11/18/2018   Procedure: BASCILIC VEIN TRANSPOSITION;  Surgeon: Katha Cabal, MD;  Location: [facility];  Service: Vascular;  Laterality: Left;   BIOPSY  09/20/2020   Procedure: BIOPSY;  Surgeon: Carol Ada,  MD;  Location: [facility];  Service: Endoscopy;;   CHOLECYSTECTOMY     COLONOSCOPY WITH PROPOFOL N/A 02/03/2018   Procedure: COLONOSCOPY WITH PROPOFOL;  Surgeon: Lin Landsman, MD;  Location: [facility];  Service: Gastroenterology;  Laterality: N/A;   CORONARY ANGIOPLASTY  07/2015   STENT   CORONARY STENT INTERVENTION N/A 09/18/2016   Procedure: Coronary Stent Intervention;  Surgeon: Troy Sine, MD;  Location: [facility];  Service: Cardiovascular;  Laterality: N/A;   DIALYSIS/PERMA CATHETER INSERTION N/A 05/10/2018   Procedure: DIALYSIS/PERMA CATHETER INSERTION;  Surgeon: Katha Cabal, MD;  Location: [facility];  Service: Cardiovascular;  Laterality: N/A;   DRESSING CHANGE UNDER ANESTHESIA Left 08/15/2017   Procedure: exploration of wound for bleeding;  Surgeon: Robert Bellow, MD;  Location: [facility];  Service: General;  Laterality: Left;   ESOPHAGOGASTRODUODENOSCOPY (EGD) WITH PROPOFOL N/A 02/03/2018   Procedure: ESOPHAGOGASTRODUODENOSCOPY (EGD) WITH PROPOFOL;  Surgeon: Lin Landsman, MD;  Location:  [facility];  Service: Gastroenterology;  Laterality: N/A;   ESOPHAGOGASTRODUODENOSCOPY (EGD) WITH PROPOFOL N/A 09/20/2020   Procedure: ESOPHAGOGASTRODUODENOSCOPY (EGD) WITH PROPOFOL;  Surgeon: Carol Ada, MD;  Location: [facility];  Service: Endoscopy;  Laterality: N/A;   EYE SURGERY  11/17/2018   INCISION AND DRAINAGE ABSCESS Left 08/12/2017   Procedure: INCISION AND DRAINAGE ABSCESS;  Surgeon: Robert Bellow, MD;  Location: [facility];  Service: General;  Laterality: Left;   KNEE ARTHROSCOPY     LEFT HEART CATH N/A 09/18/2016   Procedure: Left Heart Cath;  Surgeon: Troy Sine, MD;  Location: [facility];  Service: Cardiovascular;  Laterality: N/A;   LEFT HEART CATH AND CORONARY ANGIOGRAPHY N/A 09/16/2016   Procedure: Left Heart Cath and Coronary Angiography;  Surgeon: Burnell Blanks, MD;  Location: [facility];  Service: Cardiovascular;  Laterality: N/A;   LEFT HEART CATH AND CORONARY ANGIOGRAPHY N/A 04/29/2017   Procedure: LEFT HEART CATH AND CORONARY ANGIOGRAPHY;  Surgeon: Nelva Bush, MD;  Location: [facility];  Service: Cardiovascular;  Laterality: N/A;   LOWER EXTREMITY ANGIOGRAPHY Right 03/08/2018   Procedure: LOWER EXTREMITY ANGIOGRAPHY;  Surgeon: Katha Cabal, MD;  Location: [facility];  Service: Cardiovascular;  Laterality: Right;   TUBAL LIGATION     TUBAL LIGATION     UPPER EXTREMITY ANGIOGRAPHY Right 09/19/2019   Procedure: UPPER EXTREMITY ANGIOGRAPHY;  Surgeon: Katha Cabal, MD;  Location: [facility];  Service: Cardiovascular;  Laterality: Right;   WOUND DEBRIDEMENT Left 12/21/2018   Procedure: DEBRIDEMENT WOUND;  Surgeon: Katha Cabal, MD;  Location: [facility];  Service: Vascular;  Laterality: Left;   WOUND DEBRIDEMENT Left 12/30/2018   Procedure: DEBRIDEMENT WOUND WITH VAC PLACEMENT (LEFT UPPER EXTREMITY);  Surgeon: Katha Cabal, MD;  Location: [facility];  Service: Vascular;  Laterality: Left;      OB History     Gravida  4   Para  3   Term  3   Preterm      AB  1   Living  3      SAB  1   IAB      Ectopic      Multiple      Live Births  3           Family History  Problem Relation Age of Onset   Colon cancer Mother    Heart attack Other    Heart attack Maternal Grandmother    Hypertension  Sister    Hypertension Brother    Diabetes Paternal Grandmother    Breast cancer Neg Hx     Social History   Tobacco Use   Smoking status: Former    Packs/day: 0.25    Years: 6.00    Pack years: 1.50    Types: Cigarettes    Quit date: 10/25/1980    Years since quitting: 40.4   Smokeless tobacco: Never  Vaping Use   Vaping Use: Never used  Substance Use Topics   Alcohol use: Yes    Comment: rare   Drug use: Not Currently    Types: Marijuana    Home Medications Prior to Admission medications   Medication Sig Start Date End Date Taking? Authorizing Provider  albuterol (VENTOLIN HFA) 108 (90 Base) MCG/ACT inhaler Inhale 2 puffs into the lungs every 4 (four) hours as needed for wheezing or shortness of breath. 06/28/20   [name], PA-C  allopurinol (ZYLOPRIM) 100 MG tablet TAKE 1 TABLET BY MOUTH ONCE DAILY. 11/05/20   Birdie Sons, MD  aspirin 81 MG EC tablet Take 1 tablet (81 mg total) by mouth daily. 09/20/20   Thurnell Lose, MD  atorvastatin (LIPITOR) 80 MG tablet TAKE 1 TABLET BY MOUTH ONCE DAILY AT 6PM Patient taking differently: Take 80 mg by mouth every evening. 07/08/20   [name], PA-C  AURYXIA 1 GM 210 MG(Fe) tablet Take 2 tablets (420 mg total) by mouth 3 (three) times daily with meals. 05/03/20   [name], PA-C  B Complex-C-Folic Acid (RENA-VITE RX PO) Take 1 mg by mouth daily.    [provider]  B Complex-C-Folic Acid (RENA-VITE RX) 1 MG TABS Take 1 mg by mouth daily. 10/15/20   [provider]  buPROPion (WELLBUTRIN SR) 150 MG 12 hr tablet Take 1 tablet (150 mg total) by mouth 2  (two) times daily. 05/03/20   [name], PA-C  carvedilol (COREG) 25 MG tablet Take 1 tablet (25 mg total) by mouth 2 (two) times daily with a meal. 10/29/20   Bacigalupo, Dionne Bucy, MD  colchicine 0.6 MG tablet Take 1 tablet (0.6 mg total) by mouth once a week for 15 days. 09/29/20 10/14/20  Thurnell Lose, MD  Continuous Blood Gluc Receiver (FREESTYLE LIBRE 14 DAY READER) DEVI To check blood sugar ACHS 09/27/20   [name], PA-C  Continuous Blood Gluc Sensor (FREESTYLE LIBRE 14 DAY SENSOR) MISC To check blood sugar ACHS; change every 14 days 09/27/20   [name], PA-C  cyclobenzaprine (FLEXERIL) 10 MG tablet Take 1 tablet (10 mg total) by mouth 2 (two) times daily as needed for muscle spasms. 10/24/20   Corena Herter, PA-C  diclofenac Sodium (VOLTAREN) 1 % GEL Apply 2 g topically 4 (four) times daily. 10/24/20   Corena Herter, PA-C  dicyclomine (BENTYL) 20 MG tablet Take 1 tablet (20 mg total) by mouth in the morning and at bedtime. 09/27/20   [name], PA-C  docusate sodium (COLACE) 100 MG capsule Take 1 capsule (100 mg total) by mouth 2 (two) times daily as needed for mild constipation. 09/20/17   Gouru, Illene Silver, MD  doxepin (SINEQUAN) 10 MG capsule TAKE 1 CAPSULE(10 MG) BY MOUTH FOUR TIMES DAILY AS NEEDED FOR ITCHING Patient taking differently: Take 10 mg by mouth 2 (two) times daily. 05/03/20   [name], PA-C  fluticasone (FLONASE) 50 MCG/ACT nasal spray Place 2 sprays into both nostrils daily as needed for  allergies or rhinitis. 09/28/17   [name], PA-C  fluticasone furoate-vilanterol (BREO ELLIPTA) 200-25 MCG/INH AEPB Inhale 1 puff into the lungs daily. 08/30/20   [name], PA-C  hydrALAZINE (APRESOLINE) 100 MG tablet Take 100 mg by mouth 3 (three) times daily. 10/07/20   [provider]  hydrOXYzine (ATARAX/VISTARIL) 25 MG tablet Take 1 tablet (25 mg total) by mouth 4 (four) times daily. 12/03/20   Bacigalupo,  Dionne Bucy, MD  insulin detemir (LEVEMIR FLEXTOUCH) 100 UNIT/ML FlexPen Inject 60 Units into the skin 2 (two) times daily. 05/24/20   [name], PA-C  insulin glargine (LANTUS) 100 UNIT/ML Solostar Pen 25 iu sq qd and increase by 2 iu for every 3 day average greater than 150 03/04/21   Chrismon, Dennis E, PA-C  insulin lispro (HUMALOG KWIKPEN) 100 UNIT/ML KwikPen Inject 25 Units into the skin with breakfast, with lunch, and with evening meal. Patient taking differently: Inject 7 Units into the skin 2 (two) times daily. 05/14/20   [name], PA-C  lidocaine-prilocaine (EMLA) cream Apply 1 application topically as needed. Patient taking differently: Apply 1 application topically as needed (dialysis days). 03/27/19   Schnier, Dolores Lory, MD  loratadine (CLARITIN) 10 MG tablet TAKE 1 TABLET BY MOUTH ONCE DAILY. Patient taking differently: Take 10 mg by mouth daily as needed for allergies. 07/15/20   [name], PA-C  montelukast (SINGULAIR) 10 MG tablet TAKE ONE TABLET BY MOUTH AT BEDTIME. Patient taking differently: Take 10 mg by mouth at bedtime as needed (allergies). 07/15/20   [name], PA-C  nitroGLYCERIN (NITROSTAT) 0.4 MG SL tablet Place 1 tablet (0.4 mg total) under the tongue every 5 (five) minutes x 3 doses as needed for chest pain. 05/03/20   [name], PA-C  Nutritional Supplements (FEEDING SUPPLEMENT, NEPRO CARB STEADY,) LIQD Take 237 mLs by mouth daily. 02/01/20   Loletha Grayer, MD  nystatin cream (MYCOSTATIN) Apply 1 application topically 2 (two) times daily. Patient taking differently: Apply 1 application topically daily as needed for dry skin. 08/10/19   [name], PA-C  omeprazole (PRILOSEC) 20 MG capsule Take 20 mg by mouth daily. 10/07/20   [provider]  oxyCODONE (ROXICODONE) 5 MG immediate release tablet Take 1 tablet (5 mg total) by mouth every 12 (twelve) hours as needed for severe pain. 08/30/20   [name], PA-C  oxyCODONE-acetaminophen (PERCOCET/ROXICET) 5-325 MG tablet Take 1 tablet by mouth 4 (four) times daily as needed for pain. 10/23/20   [provider]  pantoprazole (PROTONIX) 40 MG tablet Take 1 tablet (40 mg total) by mouth 2 (two) times daily. 09/24/20   Thurnell Lose, MD  pregabalin (LYRICA) 150 MG capsule Take 1 capsule (150 mg total) by mouth 2 (two) times daily. 09/27/20   [name], PA-C  SENNA-PLUS 8.6-50 MG tablet TAKE ONE TABLET BY MOUTH AT BEDTIME. 07/15/20   [name], PA-C  sevelamer carbonate (RENVELA) 800 MG tablet Take 800 mg by mouth 3 (three) times daily. 08/21/20 08/21/21  [provider]  sucralfate (CARAFATE) 1 g tablet Take 1 tablet (1 g total) by mouth 2 (two) times daily for 26 days. 09/24/20 10/20/20  Thurnell Lose, MD  torsemide (DEMADEX) 20 MG tablet TAKE (2) TABLETS BY MOUTH TWICE DAILY. 01/24/21   Virginia Crews, MD  triamcinolone cream (KENALOG) 0.1 % APPLY TO AFFECTED AREA TWICE DAILY Patient taking differently: Apply 1 application topically 2 (two) times daily as needed (  rash). 06/17/18   [name], PA-C  ULTICARE SHORT PEN NEEDLES 31G X 8 MM MISC USE THREE TIMES DAILY WITH INSULIN 01/21/21   Jerrol Banana., MD  valACYclovir (VALTREX) 500 MG tablet TAKE (1) TABLET BY MOUTH EVERY OTHER DAY. 01/24/21   Bacigalupo, Dionne Bucy, MD  VICTOZA 18 MG/3ML SOPN Start with 0.54m daily and increase by 0.668mper week until max dose of 1.8 mg dose achieved. 02/19/21   GiJerrol Banana MD  Vitamin D, Ergocalciferol, (DRISDOL) 1.25 MG (50000 UNIT) CAPS capsule TAKE 1 CAPSULE BY MOUTH ONCE A MONTH 11/04/20   FiBirdie SonsMD  gabapentin (NEURONTIN) 100 MG capsule Take 1 capsule (100 mg total) by mouth 3 (three) times daily. 08/19/17 02/03/19  PaFritzi MandesMD    Allergies    Morphine, Shellfish allergy, and Diazepam  Review of Systems   Review of Systems  Constitutional:  Negative for chills and fever.   HENT:  Negative for sore throat.   Respiratory:  Negative for shortness of breath.   Cardiovascular:  Positive for leg swelling. Negative for chest pain.  Gastrointestinal:  Negative for abdominal pain, nausea and vomiting.  Genitourinary:  Negative for flank pain.  Musculoskeletal:  Positive for arthralgias and myalgias.  All other systems reviewed and are negative.  Physical Exam Updated Vital Signs BP (!) 157/85   Pulse 76   Temp 98.2 F (36.8 C) (Oral)   Resp 16   Ht _0  (1.727 m)   Wt 125.2 kg   SpO2 100%   BMI 41.97 kg/m   Physical Exam Vitals and nursing note reviewed.  Constitutional:      Appearance: Normal appearance.  HENT:     Head: Normocephalic and atraumatic.     Nose: Nose normal.     Mouth/Throat:     Mouth: Mucous membranes are moist.  Eyes:     Pupils: Pupils are equal, round, and reactive to light.  Cardiovascular:     Rate and Rhythm: Normal rate.     Pulses:          Dorsalis pedis pulses are detected w/ Doppler on the left side.       Posterior tibial pulses are detected w/ Doppler on the left side.     Heart sounds: Murmur heard.  Pulmonary:     Effort: Pulmonary effort is normal.     Breath sounds: No wheezing.  Abdominal:     General: Abdomen is flat. The ostomy site is clean.     Palpations: Abdomen is soft.     Tenderness: There is no abdominal tenderness. There is no right CVA tenderness or left CVA tenderness.    Musculoskeletal:        General: Swelling and tenderness present. No deformity or signs of injury.     Cervical back: Normal range of motion and neck supple.     Right lower leg: No edema.     Left lower leg: Edema present.  Skin:    General: Skin is warm and dry.     Findings: Erythema present.  Neurological:     Mental Status: She is alert and oriented to person, place, and time.    ED Results / Procedures / Treatments   Labs (all labs ordered are listed, but only abnormal results are displayed) Labs Reviewed   CBC WITH DIFFERENTIAL/PLATELET - Abnormal; Notable for the following components:      Result Value   RBC 3.63 (*)  Hemoglobin 11.6 (*)    RDW 15.7 (*)    Abs Immature Granulocytes 0.09 (*)    All other components within normal limits  COMPREHENSIVE METABOLIC PANEL - Abnormal; Notable for the following components:   Chloride 97 (*)    Glucose, Bld 357 (*)    BUN 48 (*)    Creatinine, Ser 8.64 (*)    AST 46 (*)    ALT 45 (*)    Alkaline Phosphatase 309 (*)    GFR, Estimated 5 (*)    Anion gap 19 (*)    All other components within normal limits  LIPASE, BLOOD - Abnormal; Notable for the following components:   Lipase 59 (*)    All other components within normal limits  LACTIC ACID, PLASMA - Abnormal; Notable for the following components:   Lactic Acid, Venous 2.0 (*)    All other components within normal limits  CBG MONITORING, ED - Abnormal; Notable for the following components:   Glucose-Capillary 318 (*)    All other components within normal limits  RESP PANEL BY RT-PCR (FLU A&B, COVID) ARPGX2  CULTURE, BLOOD (ROUTINE X 2)  CULTURE, BLOOD (ROUTINE X 2)  BRAIN NATRIURETIC PEPTIDE  LACTIC ACID, PLASMA    EKG None  Radiology DG Knee Complete 4 Views Left  Result Date: 03/24/2021 CLINICAL DATA:  Pain and decreased mobility. EXAM: LEFT KNEE - COMPLETE 4+ VIEW COMPARISON:  01/31/2020 FINDINGS: Small knee joint effusion. Medial compartment osteoarthritis with joint space narrowing and marginal osteophytes. Some degenerative change also at the patellofemoral joint. Regional arterial calcification is noted. IMPRESSION: Medial compartment and patellofemoral compartment osteoarthritis. Small joint effusion. Electronically Signed   By: Nelson Chimes M.D.   On: 03/24/2021 14:38   VAS [place] LOWER EXTREMITY VENOUS (DVT) ([facility] and [facility] 7a-7p)  Result Date: 03/24/2021  Lower Venous DVT Study Patient Name:  MARIANNA CID  Date of Exam:   03/24/2021 Medical Rec #: [id number]      Accession #:     [id number] Date of Birth: Jun 03, 1956      Patient Gender: F Patient Age:   14 years Exam Location:  [facility] Procedure:      VAS [place] LOWER EXTREMITY VENOUS (DVT) Referring Phys: SOPHIA CACCAVALE --------------------------------------------------------------------------------  Indications: Pain.  Limitations: Suboptimal due to patient positioning/scanning conditions as well as body habitus/patient pain tolerance. Comparison Study: 05-16-2020 Most recent prior study- right lower extremity                   venous was negative for DVT. Performing Technologist: Darlin Coco RDMS, RVT  Examination Guidelines: A complete evaluation includes B-mode imaging, spectral Doppler, color Doppler, and power Doppler as needed of all accessible portions of each vessel. Bilateral testing is considered an integral part of a complete examination. Limited examinations for reoccurring indications may be performed as noted. The reflux portion of the exam is performed with the patient in reverse Trendelenburg.  +---------+---------------+---------+-----------+----------+-------------------+ RIGHT    CompressibilityPhasicitySpontaneityPropertiesThrombus Aging      +---------+---------------+---------+-----------+----------+-------------------+ CFV      Full           Yes      Yes                                      +---------+---------------+---------+-----------+----------+-------------------+ SFJ      Full                                                             +---------+---------------+---------+-----------+----------+-------------------+  FV Prox  Full                                                             +---------+---------------+---------+-----------+----------+-------------------+ FV Mid   Full                                                             +---------+---------------+---------+-----------+----------+-------------------+ FV DistalFull                                                              +---------+---------------+---------+-----------+----------+-------------------+ PFV      Full                                                             +---------+---------------+---------+-----------+----------+-------------------+ POP      Full           Yes      Yes                                      +---------+---------------+---------+-----------+----------+-------------------+ PTV                     Yes      Yes                                      +---------+---------------+---------+-----------+----------+-------------------+ PERO                                                  Not well visualized +---------+---------------+---------+-----------+----------+-------------------+   +---------+---------------+---------+-----------+----------+-------------------+ LEFT     CompressibilityPhasicitySpontaneityPropertiesThrombus Aging      +---------+---------------+---------+-----------+----------+-------------------+ CFV      Full           Yes      Yes                                      +---------+---------------+---------+-----------+----------+-------------------+ SFJ      Full                                                             +---------+---------------+---------+-----------+----------+-------------------+ FV Prox  Full                                                             +---------+---------------+---------+-----------+----------+-------------------+  FV Mid   Full                                                             +---------+---------------+---------+-----------+----------+-------------------+ FV Distal               Yes      Yes                                      +---------+---------------+---------+-----------+----------+-------------------+ PFV      Full                                                              +---------+---------------+---------+-----------+----------+-------------------+ POP      Full           Yes      Yes                                      +---------+---------------+---------+-----------+----------+-------------------+ PTV                     Yes      Yes                                      +---------+---------------+---------+-----------+----------+-------------------+ PERO                                                  Not well visualized +---------+---------------+---------+-----------+----------+-------------------+    Summary: RIGHT: - There is no evidence of deep vein thrombosis in the lower extremity. However, portions of this examination were limited- see technologist comments above.  - No cystic structure found in the popliteal fossa.  LEFT: - There is no evidence of deep vein thrombosis in the lower extremity. However, portions of this examination were limited- see technologist comments above.  - No cystic structure found in the popliteal fossa.  *See table(s) above for measurements and observations.    Preliminary     Procedures Procedures   Medications Ordered in ED Medications  oxyCODONE-acetaminophen (PERCOCET/ROXICET) 5-325 MG per tablet 1 tablet (1 tablet Oral Given 03/24/21 1737)    ED Course  I have reviewed the triage vital signs and the nursing notes.  Pertinent labs & imaging results that were available during my care of the patient were reviewed by me and considered in my medical decision making (see chart for details).  Clinical Course as of 03/24/21 1745  Mon Mar 24, 2021  1700 B Natriuretic Peptide: 48.5 [JS]  1707 BUN(!): 48 [JS]    Clinical Course User Index [JS] Janeece Fitting, PA-C   MDM Rules/Calculators/A&P   Patient arrives to the ED with a chief complaint of left leg pain that has been ongoing since yesterday.  She is a dialysis patient, last dialyzed Saturday with a complete treatment.  Reports increased pain to her  left lower leg along with numbness to the distal aspect of her foot.  She does have a prior history of osteoarthritis, however denies any trauma or fevers.  During evaluation left leg appears more swollen than the right leg.  There is also multiple open wounds, noted throughout.  Along with some induration however no notable abscesses present.  There is significant pain with palpation behind the left knee.  X-Motter of the left knee did show a small joint effusion.  She continues to deny any trauma.  Foot does feel somewhat cold, pulses are detected with Doppler.  She also had a ultrasound of her lower leg ordered in triage, this was negative for any DVT.  CBG checked on arrival is 318.  Some suspicion for infection.  Will obtain further work-up at this time.  Labs on today's visit with no leukocytosis, hemoglobin is at her baseline she does have a prior hx of anemia. CMP without any electrolyte derangement. Creatine level is 8.6 which is at her baseline after yesterdays Saturday full dialysis session. BUN is elevated and after extensive chart review she doe shave a previous admission for uremic encephalopathy however these levels were double in the past admission. Patient is alert and oriented although does confuse some events, no focal deficits on exam. BNP is normal, denies any SOB, chest pain. Lipase level at her baseline. Blood cultures were also collected, but I have a lower suspicion for infection at this time. Will contact family member to obtain collateral on patients mental status.   5:27 PM Spoke to Daughter Rayne Du at the bedside who reports patient is at baseline on mental status. Patient's mental status has not been fully resolved since her discharge from the hospital about a year ago. Patient is currently ambulating with a walker at home according to daughter. Patient and daughter were offered PT consultation at this time, however this was declined. Patient has not had any falls, fever or other  symptoms from listed above.  Negative covid, influenza A, or influenza B   These results were discussed with daughter and patient at length.  Patient had left knee wrapped with an Ace bandage.  They are reporting wanting to be evaluated at [facility].  I have reported to patient there was no acute finding on my work-up today, patient does have good pulses with Doppler, I did suggest a vascular referral in the future for further evaluation if pain continues.  She is also diabetic could be peripheral neuropathy.  No other signs of infection on my exam.  No signs of fractures or acute findings.  Patient understands and agrees with management, return precautions discussed at length.  Patient stable for discharge.   Portions of this note were generated with Lobbyist. Dictation errors may occur despite best attempts at proofreading.  Final Clinical Impression(s) / ED Diagnoses Final diagnoses:  Pain of left lower extremity    Rx / DC Orders ED Discharge Orders     None        Janeece Fitting, Hershal Coria 03/24/21 1745    Luna Fuse, MD 03/24/21 1756

## 2021-03-24 NOTE — ED Provider Notes (Signed)
Emergency Medicine Provider Triage Evaluation Note  [name] , a 65 y.o. female  was evaluated in triage.  Pt complains of left leg numbness since yesterday as well as worsening right knee pain.  She is also having increased drowsiness.  Patient states he has a history of OA, follows with Ortho.  Yesterday she developed numbness of her entire left lower leg.  However she also states it is very painful.  She states she is feeling drowsier than normal.  Denies fever, new cough, chest pain, shortness of breath, nausea, vomiting.  She is a dialysis patient, goes Tuesday, Thursday, Saturday.  Last session was Saturday and was normal.  She lives at home with her daughter, does not use a walker or cane.  She is not on blood thinners.  Review of Systems  Positive: Left leg numbness, drowsiness Negative: Fever  Physical Exam  BP (!) 146/77 (BP Location: Right Arm)   Pulse 83   Temp 98.2 F (36.8 C) (Oral)   Resp 14   Ht 5\' 8"  (1.727 m)   Wt 125.2 kg   SpO2 100%   BMI 41.97 kg/m  Gen:   Drowsy, however able to respond appropriately to questions when aroused. Resp:  Normal effort  MSK:   Exam limited due to body habitus.  Left leg seems slightly indurated, but not erythematous or warm.  Diffuse tenderness palpation of bilateral calves.  Left leg is more swollen than right. Other:  No TTP of the abdomen.  Thrill of the left upper extremity where her AV fistula is. Cardiac murmur noted.   Medical Decision Making  Medically screening exam initiated at 3:01 PM.  Appropriate orders placed.  Terrilee Croak Helminiak was informed that the remainder of the evaluation will be completed by another provider, this initial triage assessment does not replace that evaluation, and the importance of remaining in the ED until their evaluation is complete.  Labs, ekg, covid, [place].    Franchot Heidelberg, PA-C 03/24/21 1503    Luna Fuse, MD 03/24/21 1756

## 2021-03-29 LAB — CULTURE, BLOOD (ROUTINE X 2): Culture: NO GROWTH

## 2021-04-03 NOTE — ED Notes (Signed)
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

## 2021-04-07 ENCOUNTER — Telehealth: Payer: Self-pay

## 2021-04-07 NOTE — Telephone Encounter (Signed)
Copied from [place] [phone]. Topic: Medical Record Request - Other >> Apr 07, 2021  7:52 AM Lennox Solders wrote: patient Name/DOB/MRN #: [id number] Requestor Name/Agency: brandy with [facility]. Pt is at hospital now Call Back #: [phone] and fax number [phone] Information Requested: copy of patient medication list   Route to [place] for [place] clinics. For all other clinics, route to the clinic's PEC Pool.

## 2021-04-09 ENCOUNTER — Telehealth: Payer: Self-pay | Admitting: *Deleted

## 2021-04-09 NOTE — Chronic Care Management (AMB) (Signed)
  Care Management   Note  04/09/2021 Name: [name] MRN: [id number] DOB: July 29, 1955  [name] is a 65 y.o. year old female who is a primary care patient of [facility] [place] and is actively engaged with the care management team. I reached out to [name] by phone today to assist with re-scheduling an initial visit with the Pharmacist  Follow up plan: Unsuccessful telephone outreach attempt made. A HIPAA compliant phone message was left for the patient providing contact information and requesting a return call.   Julian Hy, [place] Management  Direct Dial: [phone]

## 2021-04-14 NOTE — Chronic Care Management (AMB) (Signed)
  Care Management   Note  04/14/2021 Name: [name] MRN: [id number] DOB: [birth year]  [name] is a 65 y.o. year old female who is a primary care patient of [facility] and is actively engaged with the care management team. I reached out to [name] by phone today to assist with re-scheduling an initial visit with the Pharmacist  Follow up plan: 2nd attempt Unsuccessful telephone outreach attempt made. A HIPAA compliant phone message was left for the patient providing contact information and requesting a return call.   Julian Hy, [place] Management  Direct Dial: [phone]

## 2021-04-17 IMAGING — DX PORTABLE CHEST - 1 VIEW
1 series · 1 of 1 positions shown · non-contrast
Comparison: 05/09/2018

CLINICAL DATA: CHF

EXAM:
PORTABLE CHEST 1 VIEW

[chest ap]
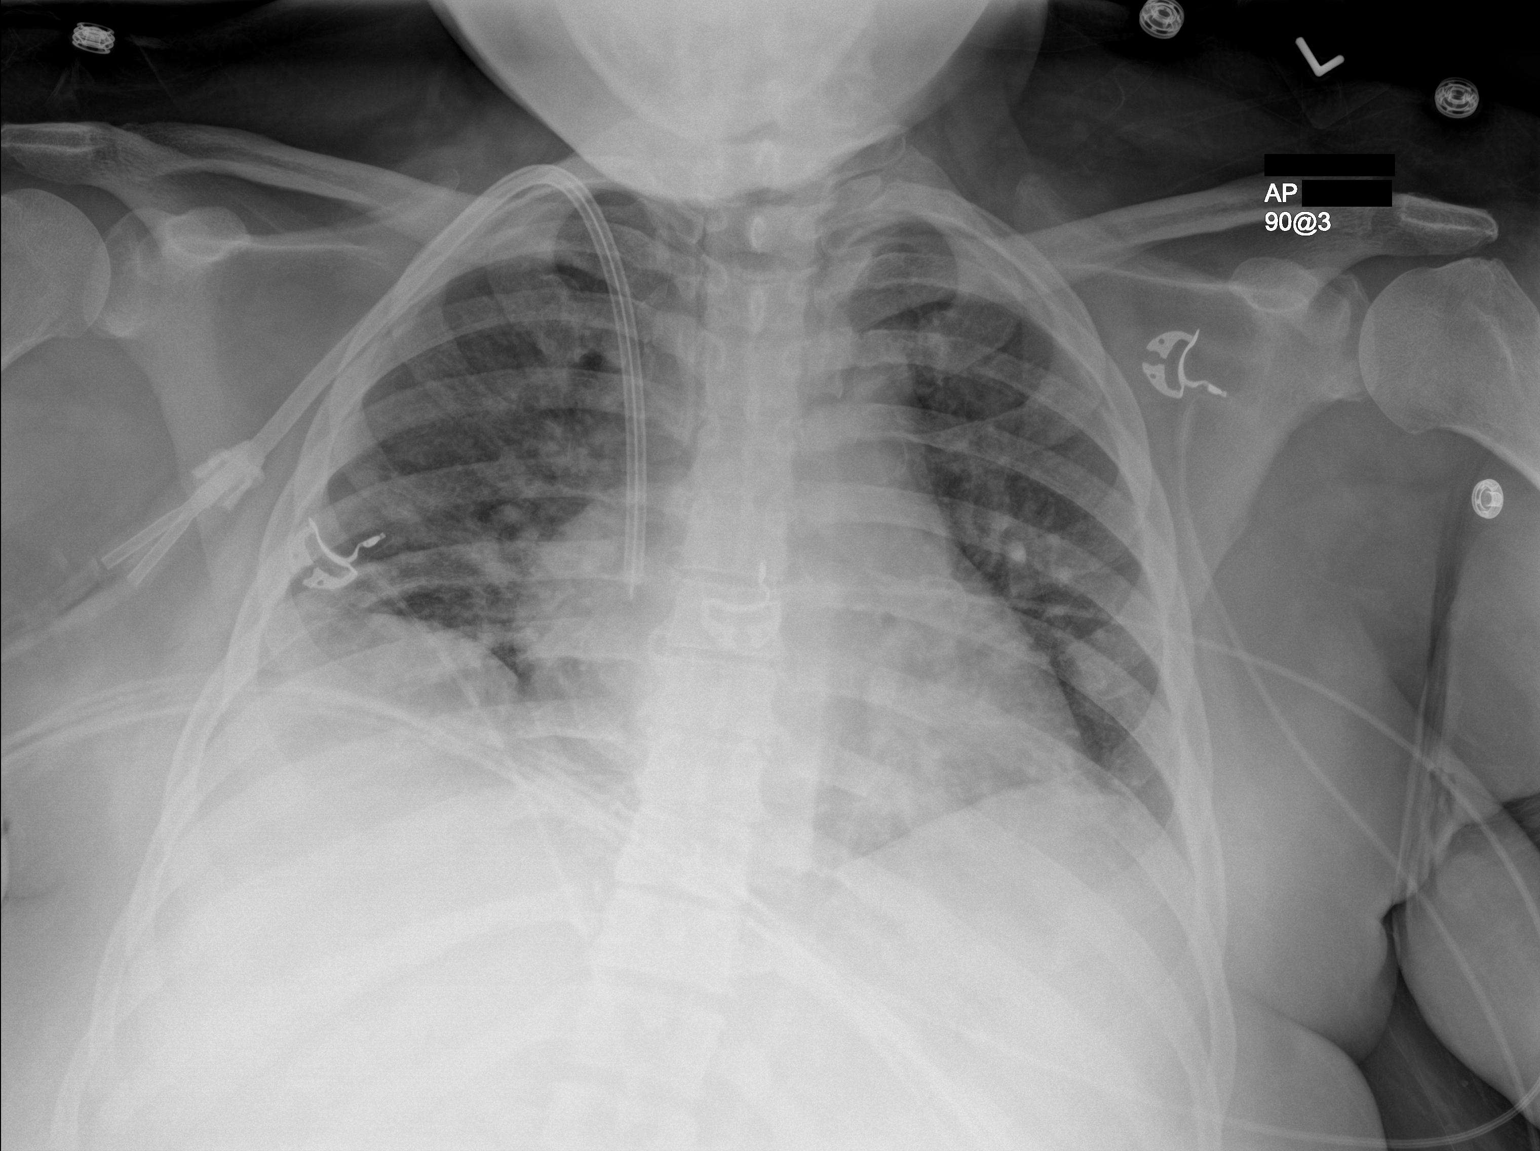

[1 of 1 positions shown; findings below may reference images not displayed]

FINDINGS: Cardiomegaly and pulmonary vascular congestion noted.

A RIGHT IJ central venous catheter is present with tip overlying the
SUPERIOR cavoatrial junction.

Mild RIGHT basilar atelectasis is identified.

No definite airspace disease, pleural effusion or pneumothorax.

No acute bony abnormalities are present.
IMPRESSION: Cardiomegaly with pulmonary vascular congestion and mild RIGHT
basilar atelectasis.

## 2021-04-17 NOTE — Chronic Care Management (AMB) (Signed)
  Care Management   Note  04/17/2021 Name: [name] MRN: [id number] DOB: 09-19-1955  [name] is a 65 y.o. year old female who is a primary care patient of [facility] [place] and is actively engaged with the care management team. I reached out to [employer] by phone today to assist with re-scheduling an initial visit with the Pharmacist  Follow up plan: We have been unable to make contact with the patient for follow up. The care management team is available to follow up with the patient after provider conversation with the patient regarding recommendation for care management engagement and subsequent re-referral to the care management team.   Julian Hy, [place] Management  Direct Dial: [phone]

## 2021-04-20 ENCOUNTER — Inpatient Hospital Stay (HOSPITAL_COMMUNITY)
Admission: EM | Admit: 2021-04-20 | Discharge: 2021-04-23 | DRG: 291 | Disposition: A | Discharge status: Home / Self Care | Payer: Medicare Other | Attending: Internal Medicine | Admitting: Internal Medicine

## 2021-04-20 ENCOUNTER — Emergency Department (HOSPITAL_COMMUNITY): Payer: Medicare Other

## 2021-04-20 ENCOUNTER — Other Ambulatory Visit: Payer: Self-pay

## 2021-04-20 DIAGNOSIS — E1165 Type 2 diabetes mellitus with hyperglycemia: Secondary | ICD-10-CM | POA: Diagnosis present

## 2021-04-20 DIAGNOSIS — A419 Sepsis, unspecified organism: Secondary | ICD-10-CM | POA: Diagnosis not present

## 2021-04-20 DIAGNOSIS — R6520 Severe sepsis without septic shock: Secondary | ICD-10-CM | POA: Diagnosis present

## 2021-04-20 DIAGNOSIS — G9341 Metabolic encephalopathy: Secondary | ICD-10-CM | POA: Diagnosis present

## 2021-04-20 DIAGNOSIS — Z933 Colostomy status: Secondary | ICD-10-CM | POA: Diagnosis not present

## 2021-04-20 DIAGNOSIS — R410 Disorientation, unspecified: Secondary | ICD-10-CM

## 2021-04-20 DIAGNOSIS — N186 End stage renal disease: Secondary | ICD-10-CM

## 2021-04-20 DIAGNOSIS — E785 Hyperlipidemia, unspecified: Secondary | ICD-10-CM | POA: Diagnosis present

## 2021-04-20 DIAGNOSIS — Z79899 Other long term (current) drug therapy: Secondary | ICD-10-CM | POA: Diagnosis not present

## 2021-04-20 DIAGNOSIS — Z955 Presence of coronary angioplasty implant and graft: Secondary | ICD-10-CM | POA: Diagnosis not present

## 2021-04-20 DIAGNOSIS — E1100 Type 2 diabetes mellitus with hyperosmolarity without nonketotic hyperglycemic-hyperosmolar coma (NKHHC): Secondary | ICD-10-CM | POA: Diagnosis not present

## 2021-04-20 DIAGNOSIS — Z833 Family history of diabetes mellitus: Secondary | ICD-10-CM

## 2021-04-20 DIAGNOSIS — R0602 Shortness of breath: Secondary | ICD-10-CM | POA: Diagnosis not present

## 2021-04-20 DIAGNOSIS — I10 Essential (primary) hypertension: Secondary | ICD-10-CM

## 2021-04-20 DIAGNOSIS — M7989 Other specified soft tissue disorders: Secondary | ICD-10-CM | POA: Diagnosis not present

## 2021-04-20 DIAGNOSIS — Z7982 Long term (current) use of aspirin: Secondary | ICD-10-CM | POA: Diagnosis not present

## 2021-04-20 DIAGNOSIS — R0902 Hypoxemia: Secondary | ICD-10-CM

## 2021-04-20 DIAGNOSIS — K219 Gastro-esophageal reflux disease without esophagitis: Secondary | ICD-10-CM | POA: Diagnosis present

## 2021-04-20 DIAGNOSIS — Z8249 Family history of ischemic heart disease and other diseases of the circulatory system: Secondary | ICD-10-CM

## 2021-04-20 DIAGNOSIS — I252 Old myocardial infarction: Secondary | ICD-10-CM

## 2021-04-20 DIAGNOSIS — J9601 Acute respiratory failure with hypoxia: Secondary | ICD-10-CM | POA: Diagnosis present

## 2021-04-20 DIAGNOSIS — Z794 Long term (current) use of insulin: Secondary | ICD-10-CM

## 2021-04-20 DIAGNOSIS — Z992 Dependence on renal dialysis: Secondary | ICD-10-CM | POA: Diagnosis not present

## 2021-04-20 DIAGNOSIS — D6489 Other specified anemias: Secondary | ICD-10-CM | POA: Diagnosis present

## 2021-04-20 DIAGNOSIS — E1142 Type 2 diabetes mellitus with diabetic polyneuropathy: Secondary | ICD-10-CM | POA: Diagnosis present

## 2021-04-20 DIAGNOSIS — U071 COVID-19: Secondary | ICD-10-CM | POA: Diagnosis not present

## 2021-04-20 DIAGNOSIS — M79604 Pain in right leg: Secondary | ICD-10-CM | POA: Diagnosis not present

## 2021-04-20 DIAGNOSIS — I5043 Acute on chronic combined systolic (congestive) and diastolic (congestive) heart failure: Secondary | ICD-10-CM | POA: Diagnosis present

## 2021-04-20 DIAGNOSIS — Z23 Encounter for immunization: Secondary | ICD-10-CM | POA: Diagnosis not present

## 2021-04-20 DIAGNOSIS — I2510 Atherosclerotic heart disease of native coronary artery without angina pectoris: Secondary | ICD-10-CM | POA: Diagnosis present

## 2021-04-20 DIAGNOSIS — Z6841 Body Mass Index (BMI) 40.0 and over, adult: Secondary | ICD-10-CM

## 2021-04-20 DIAGNOSIS — Z9115 Patient's noncompliance with renal dialysis: Secondary | ICD-10-CM | POA: Diagnosis not present

## 2021-04-20 DIAGNOSIS — M79605 Pain in left leg: Secondary | ICD-10-CM | POA: Diagnosis not present

## 2021-04-20 DIAGNOSIS — Z7951 Long term (current) use of inhaled steroids: Secondary | ICD-10-CM

## 2021-04-20 DIAGNOSIS — E1122 Type 2 diabetes mellitus with diabetic chronic kidney disease: Secondary | ICD-10-CM | POA: Diagnosis present

## 2021-04-20 DIAGNOSIS — T380X5A Adverse effect of glucocorticoids and synthetic analogues, initial encounter: Secondary | ICD-10-CM | POA: Diagnosis present

## 2021-04-20 DIAGNOSIS — N2581 Secondary hyperparathyroidism of renal origin: Secondary | ICD-10-CM | POA: Diagnosis present

## 2021-04-20 DIAGNOSIS — E669 Obesity, unspecified: Secondary | ICD-10-CM | POA: Diagnosis present

## 2021-04-20 DIAGNOSIS — I132 Hypertensive heart and chronic kidney disease with heart failure and with stage 5 chronic kidney disease, or end stage renal disease: Principal | ICD-10-CM | POA: Diagnosis present

## 2021-04-20 DIAGNOSIS — Z87891 Personal history of nicotine dependence: Secondary | ICD-10-CM | POA: Diagnosis not present

## 2021-04-20 DIAGNOSIS — Z91013 Allergy to seafood: Secondary | ICD-10-CM | POA: Diagnosis not present

## 2021-04-20 DIAGNOSIS — Z800 Family history of malignant neoplasm of digestive organs: Secondary | ICD-10-CM

## 2021-04-20 DIAGNOSIS — M109 Gout, unspecified: Secondary | ICD-10-CM | POA: Diagnosis present

## 2021-04-20 LAB — CBC WITH DIFFERENTIAL/PLATELET
Abs Immature Granulocytes: 0.07 10*3/uL (ref 0.00–0.07)
Basophils Absolute: 0 10*3/uL (ref 0.0–0.1)
Basophils Relative: 0 %
Eosinophils Absolute: 0 10*3/uL (ref 0.0–0.5)
Eosinophils Relative: 1 %
HCT: 40.4 % (ref 36.0–46.0)
Hemoglobin: 12.8 g/dL (ref 12.0–15.0)
Immature Granulocytes: 1 %
Lymphocytes Relative: 17 %
Lymphs Abs: 0.9 10*3/uL (ref 0.7–4.0)
MCH: 30.8 pg (ref 26.0–34.0)
MCHC: 31.7 g/dL (ref 30.0–36.0)
MCV: 97.3 fL (ref 80.0–100.0)
Monocytes Absolute: 0.5 10*3/uL (ref 0.1–1.0)
Monocytes Relative: 10 %
Neutro Abs: 3.9 10*3/uL (ref 1.7–7.7)
Neutrophils Relative %: 71 %
Platelets: 106 10*3/uL — ABNORMAL LOW (ref 150–400)
RBC: 4.15 MIL/uL (ref 3.87–5.11)
RDW: 14.2 % (ref 11.5–15.5)
WBC: 5.4 10*3/uL (ref 4.0–10.5)
nRBC: 0 % (ref 0.0–0.2)

## 2021-04-20 LAB — COMPREHENSIVE METABOLIC PANEL
ALT: 26 U/L (ref 0–44)
AST: 44 U/L — ABNORMAL HIGH (ref 15–41)
Albumin: 2.8 g/dL — ABNORMAL LOW (ref 3.5–5.0)
Alkaline Phosphatase: 153 U/L — ABNORMAL HIGH (ref 38–126)
Anion gap: 17 — ABNORMAL HIGH (ref 5–15)
BUN: 49 mg/dL — ABNORMAL HIGH (ref 8–23)
CO2: 25 mmol/L (ref 22–32)
Calcium: 7.8 mg/dL — ABNORMAL LOW (ref 8.9–10.3)
Chloride: 92 mmol/L — ABNORMAL LOW (ref 98–111)
Creatinine, Ser: 10.6 mg/dL — ABNORMAL HIGH (ref 0.44–1.00)
GFR, Estimated: 4 mL/min — ABNORMAL LOW (ref >60)
Glucose, Bld: 191 mg/dL — ABNORMAL HIGH (ref 70–99)
Potassium: 4.8 mmol/L (ref 3.5–5.1)
Sodium: 134 mmol/L — ABNORMAL LOW (ref 135–145)
Total Bilirubin: 1.1 mg/dL (ref 0.3–1.2)
Total Protein: 6.7 g/dL (ref 6.5–8.1)

## 2021-04-20 LAB — I-STAT CHEM 8, ED
BUN: 72 mg/dL — ABNORMAL HIGH (ref 8–23)
Calcium, Ion: 0.86 mmol/L — CL (ref 1.15–1.40)
Chloride: 95 mmol/L — ABNORMAL LOW (ref 98–111)
Creatinine, Ser: 11 mg/dL — ABNORMAL HIGH (ref 0.44–1.00)
Glucose, Bld: 188 mg/dL — ABNORMAL HIGH (ref 70–99)
HCT: 55 % — ABNORMAL HIGH (ref 36.0–46.0)
Hemoglobin: 18.7 g/dL — ABNORMAL HIGH (ref 12.0–15.0)
Potassium: 4.8 mmol/L (ref 3.5–5.1)
Sodium: 134 mmol/L — ABNORMAL LOW (ref 135–145)
TCO2: 28 mmol/L (ref 22–32)

## 2021-04-20 LAB — LACTIC ACID, PLASMA: Lactic Acid, Venous: 1.4 mmol/L (ref 0.5–1.9)

## 2021-04-20 LAB — RESP PANEL BY RT-PCR (FLU A&B, COVID) ARPGX2
Influenza A by PCR: NEGATIVE
Influenza B by PCR: NEGATIVE
SARS Coronavirus 2 by RT PCR: POSITIVE — AB

## 2021-04-20 MED ORDER — SODIUM CHLORIDE 0.9 % IV SOLN
100.0000 mg | Freq: Every day | INTRAVENOUS | Status: DC
  Administered 2021-04-21 – 2021-04-22 (×2): 100 mg via INTRAVENOUS
  Filled 2021-04-20 (×4): qty 20

## 2021-04-20 MED ORDER — INSULIN ASPART 100 UNIT/ML IJ SOLN
0.0000 [IU] | Freq: Three times a day (TID) | INTRAMUSCULAR | Status: DC
  Administered 2021-04-21: 6 [IU] via SUBCUTANEOUS

## 2021-04-20 MED ORDER — DEXAMETHASONE SODIUM PHOSPHATE 10 MG/ML IJ SOLN
8.0000 mg | Freq: Once | INTRAMUSCULAR | Status: AC
  Administered 2021-04-20: 8 mg via INTRAVENOUS
  Filled 2021-04-20: qty 1

## 2021-04-20 MED ORDER — SODIUM CHLORIDE 0.9 % IV SOLN
200.0000 mg | Freq: Once | INTRAVENOUS | Status: AC
  Administered 2021-04-20: 200 mg via INTRAVENOUS
  Filled 2021-04-20: qty 40

## 2021-04-20 MED ORDER — ACETAMINOPHEN 325 MG PO TABS
650.0000 mg | ORAL_TABLET | Freq: Four times a day (QID) | ORAL | Status: DC | PRN
  Administered 2021-04-21 – 2021-04-22 (×2): 650 mg via ORAL
  Filled 2021-04-20 (×2): qty 2

## 2021-04-20 MED ORDER — ACETAMINOPHEN 650 MG RE SUPP: 650.0000 mg | Freq: Four times a day (QID) | RECTAL | Status: DC | PRN

## 2021-04-20 MED ORDER — ACETAMINOPHEN 325 MG PO TABS
650.0000 mg | ORAL_TABLET | Freq: Once | ORAL | Status: AC
  Administered 2021-04-20: 650 mg via ORAL
  Filled 2021-04-20: qty 2

## 2021-04-20 NOTE — ED Provider Notes (Signed)
Emergency Medicine Provider Triage Evaluation Note  [name] , a 65 y.o. female  was evaluated in triage.  Pt complains of slower to respond than usual, missed dialysis.  EMS reports tha she only missed 1 dialysis session.  Chart review shows that she was seen yesterday at outside hospital was found to be hypoxic and put on 2 L, unclear what happened after however it appears she was sent home without oxygen. She was hypoxic with EMS.  Review of Systems  Positive: AMS, hyperglycemia, bilateral leg pain Negative: Vomiting  Physical Exam  There were no vitals taken for this visit. Patient seated in wheelchair in no obvious distress.  Medical Decision Making  Medically screening exam initiated at 7:54 PM.  Appropriate orders placed.  [name] was informed that the remainder of the evaluation will be completed by another provider, this initial triage assessment does not replace that evaluation, and the importance of remaining in the ED until their evaluation is complete.  Based on report per EMS communicated with charge nurse and recommended the patient be brought back next.  Note: Portions of this report may have been transcribed using voice recognition software. Every effort was made to ensure accuracy; however, inadvertent computerized transcription errors may be present    [name] 04/20/21 2009    Daleen Bo, MD 04/21/21 [phone]

## 2021-04-20 NOTE — H&P (Signed)
History and Physical    PLEASE NOTE THAT DRAGON DICTATION SOFTWARE WAS USED IN THE CONSTRUCTION OF THIS NOTE.   [name] EYC:[id number] DOB: 03/09/1956 DOA: 04/20/2021  PCP: [facility] [place] Patient coming from: home   I have personally briefly reviewed patient's old medical records in [place]  Chief Complaint: Shortness of breath  HPI: [name] is a 65 y.o. female with medical history significant for end-stage renal disease on hemodialysis on Tuesday, Thursday, Saturday schedule, type 2 diabetes mellitus, chronic diastolic heart failure, hyperlipidemia, hypertension, who is admitted to [facility] on 04/20/2021 with severe COVID-19 infection after presenting from home to [facility] ED complaining of shortness of breath.  The following history is provided by the patient, her daughter, as well as my discussions with the EDP and via chart review.  The patient has been experiencing 2 to 3 days of progressive shortness of breath associated with new onset nonproductive cough, without hemoptysis.  She also notes associated subjective fever in the absence of any chills, full body rigors, or generalized myalgias. Denies any associated orthopnea, PND, or new onset peripheral edema. No recent chest pain, diaphoresis, palpitations, N/V, pre-syncope, or syncope.  Not associate with any wheezing, new lower extremity erythema, or calf tenderness.  Denies any recent melena or hematochezia.  Not associate with any headache, neck stiffness, or rash.  Daughter conveys that over the last 1 to 2 days, the patient has also been confused relative to her baseline, which she is reportedly alert and oriented x4.  No recent trauma. Denies any acute focal weakness, acute focal numbness, paresthesias, facial droop, slurred speech, expressive aphasia, acute change in vision, dysphagia, vertigo.  Of note, the patient has a history of end-stage renal disease on hemodialysis on Tuesday, Thursday,  Saturday schedule.  The patient had presented yesterday (Saturday, 04/19/21) to the emergency department in [place] complaining of the aforementioned shortness of breath, before subsequently being discharged back to home without any discharge pharmacologic management.  As the patient spent most of yesterday in the emergency department, as above, she missed her routine scheduled hemodialysis session, noting that her most recent completed hemodialysis session occurred on Thursday, April 17, 2021.  She also has a reported history of necrotizing fasciitis over the abdomen, resulting in colostomy.  While colostomy remains present, it appears that she is scheduled for colostomy takedown to occur on 05/16/2021 with Dr. Arvin Collard.  Denies any recent change in the nature or volume of stool output via the colostomy.  Medical history also notable for chronic diastolic heart failure, most recent echocardiogram in January 2020 showing normal left ventricular cavity size, moderate LVH, LVEF 60 to 14%, grade 1 diastolic dysfunction, and moderate aortic stenosis.  The patient confirms no chronic underlying pulmonary pathology, and denies any baseline supplemental oxygen requirements.    ED Course:  Vital signs in the ED were notable for the following: Temperature max 101.8, heart rate 86-88; blood pressure 114/56 -131/55; respiratory rate 15-21; initial oxygen saturation noted to be 89% on room air, which is subsequently improved to 98 to 99% on 3 L nasal cannula.  Labs were notable for the following: CMP was notable for the following: Potassium 4.8, bicarbonate 25, glucose 191, liver enzymes were found to be within normal limits.  CBC notable for white blood cell count 5400, hemoglobin 12.8.  Lactic acid 1.4.  COVID-19 PCR performed in the ED today was found to be positive, while influenza PCR was noted to be negative.  Blood  cultures x2 were collected in the ED.  Imaging and additional notable ED work-up: EKG shows  sinus rhythm with heart rate 87, QTc notable for 489, and no evidence of T wave or ST changes, including no evidence of ST elevation.  Chest x-Lauderbaugh, in comparison to most recent prior from 03/05/2021 showed chronic vascular congestion, without interval change, and no evidence of acute cardiopulmonary process, including no evidence of infiltrate, effusion, or pneumothorax.  Noncontrast CT that showed no evidence of acute intracranial process, including no evidence of intracranial hemorrhage.  In the context of the patient's altered mental status and history of necrotizing fasciitis, CT abdomen/pelvis was pursued, it showed no evidence of acute intra-abdominal or intrapelvic abnormality.  While in the ED, the following were administered: Decadron 8 mg IV x1, remdesivir was initiated per pharmacy consultation.  Subsequently, the patient was admitted to the PCU for further evaluation management severe COVID-19 infection associated acute hypoxic respiratory distress.      Review of Systems: As per HPI otherwise 10 point review of systems negative.   Past Medical History:  Diagnosis Date   Anemia    Aortic stenosis    Echo 8/18: mean 13, peak 28, LVOT/AV mean velocity 0.51   Arthritis    Asthma    As a child    Bronchitis    CAD (coronary artery disease)    a. 09/2016: 50% Ost 1st Mrg stenosis, 50% 2nd Mrg stenosis, 20% Mid-Cx, 95% Prox LAD, 40% mid-LAD, and 10% dist-LAD stenosis. Staged PCI with DES to Prox-LAD.    Chronic combined systolic and diastolic CHF (congestive heart failure) ([place]) 2011   echo 2/18: EF 55-60, normal wall motion, grade 2 diastolic dysfunction, trivial AI // echo 3/18: Septal and apical HK, EF 45-50, normal wall motion, trivial AI, mild LAE, PASP 38 // echo 8/18: EF 60-65, normal wall motion, grade 1 diastolic dysfunction, calcified aortic valve leaflets, mild aortic stenosis (mean 13, peak 28, LVOT/AV mean velocity 0.51), mild AI, moderate MAC, mild LAE, trivial TR    Chronic  kidney disease    STAGE 4   Chronic kidney disease on chronic dialysis (HCC)    t, th, sat   Complication of anesthesia    Depression    Diabetes mellitus Dx 1989   Elevated lipids    GERD (gastroesophageal reflux disease)    Gout    Heart murmur    asymptomatic   Hepatitis C Dx 2013   Hypertension Dx 1989   Infected surgical wound    Lt arm   Myocardial infarction ([place]) 07/2015   Obesity    Pancreatitis 2013   Pneumonia    Refusal of blood transfusions as patient is Jehovah's Witness    pt states she is not [place] witness and does not refuse blood products   Tendinitis    Tremors of nervous system    LEFT HAND   Ulcer 2010    Past Surgical History:  Procedure Laterality Date   A/V FISTULAGRAM Left 04/11/2019   Procedure: A/V FISTULAGRAM;  Surgeon: Katha Cabal, MD;  Location: [place] [facility];  Service: Cardiovascular;  Laterality: Left;   A/V FISTULAGRAM Left 06/02/2019   Procedure: A/V FISTULAGRAM;  Surgeon: Katha Cabal, MD;  Location: [place] [facility];  Service: Cardiovascular;  Laterality: Left;   APPLICATION OF WOUND VAC Left 08/14/2017   Procedure: APPLICATION OF WOUND VAC Exchange;  Surgeon: Robert Bellow, MD;  Location: [facility];  Service: General;  Laterality: Left;  APPLICATION OF WOUND VAC Left 12/21/2018   Procedure: APPLICATION OF WOUND VAC;  Surgeon: Katha Cabal, MD;  Location: [facility];  Service: Vascular;  Laterality: Left;   AV FISTULA PLACEMENT Left 08/19/2018   Procedure: ARTERIOVENOUS (AV) FISTULA CREATION ( BRACHIOBASILIC );  Surgeon: Katha Cabal, MD;  Location: [facility];  Service: Vascular;  Laterality: Left;   BASCILIC VEIN TRANSPOSITION Left 11/18/2018   Procedure: BASCILIC VEIN TRANSPOSITION;  Surgeon: Katha Cabal, MD;  Location: [facility];  Service: Vascular;  Laterality: Left;   BIOPSY  09/20/2020   Procedure: BIOPSY;  Surgeon: Carol Ada, MD;  Location: [place];  Service: Endoscopy;;    CHOLECYSTECTOMY     COLONOSCOPY WITH PROPOFOL N/A 02/03/2018   Procedure: COLONOSCOPY WITH PROPOFOL;  Surgeon: Lin Landsman, MD;  Location: [facility];  Service: Gastroenterology;  Laterality: N/A;   CORONARY ANGIOPLASTY  07/2015   STENT   CORONARY STENT INTERVENTION N/A 09/18/2016   Procedure: Coronary Stent Intervention;  Surgeon: Troy Sine, MD;  Location: [place] [facility];  Service: Cardiovascular;  Laterality: N/A;   DIALYSIS/PERMA CATHETER INSERTION N/A 05/10/2018   Procedure: DIALYSIS/PERMA CATHETER INSERTION;  Surgeon: Katha Cabal, MD;  Location: [place] [facility];  Service: Cardiovascular;  Laterality: N/A;   DRESSING CHANGE UNDER ANESTHESIA Left 08/15/2017   Procedure: exploration of wound for bleeding;  Surgeon: Robert Bellow, MD;  Location: [facility];  Service: General;  Laterality: Left;   ESOPHAGOGASTRODUODENOSCOPY (EGD) WITH PROPOFOL N/A 02/03/2018   Procedure: ESOPHAGOGASTRODUODENOSCOPY (EGD) WITH PROPOFOL;  Surgeon: Lin Landsman, MD;  Location: [facility];  Service: Gastroenterology;  Laterality: N/A;   ESOPHAGOGASTRODUODENOSCOPY (EGD) WITH PROPOFOL N/A 09/20/2020   Procedure: ESOPHAGOGASTRODUODENOSCOPY (EGD) WITH PROPOFOL;  Surgeon: Carol Ada, MD;  Location: [place];  Service: Endoscopy;  Laterality: N/A;   EYE SURGERY  11/17/2018   INCISION AND DRAINAGE ABSCESS Left 08/12/2017   Procedure: INCISION AND DRAINAGE ABSCESS;  Surgeon: Robert Bellow, MD;  Location: [facility];  Service: General;  Laterality: Left;   KNEE ARTHROSCOPY     LEFT HEART CATH N/A 09/18/2016   Procedure: Left Heart Cath;  Surgeon: Troy Sine, MD;  Location: [place] [facility];  Service: Cardiovascular;  Laterality: N/A;   LEFT HEART CATH AND CORONARY ANGIOGRAPHY N/A 09/16/2016   Procedure: Left Heart Cath and Coronary Angiography;  Surgeon: Burnell Blanks, MD;  Location: [place] [facility];  Service: Cardiovascular;  Laterality: N/A;   LEFT HEART CATH  AND CORONARY ANGIOGRAPHY N/A 04/29/2017   Procedure: LEFT HEART CATH AND CORONARY ANGIOGRAPHY;  Surgeon: Nelva Bush, MD;  Location: [place] [facility];  Service: Cardiovascular;  Laterality: N/A;   LOWER EXTREMITY ANGIOGRAPHY Right 03/08/2018   Procedure: LOWER EXTREMITY ANGIOGRAPHY;  Surgeon: Katha Cabal, MD;  Location: [place] [facility];  Service: Cardiovascular;  Laterality: Right;   TUBAL LIGATION     TUBAL LIGATION     UPPER EXTREMITY ANGIOGRAPHY Right 09/19/2019   Procedure: UPPER EXTREMITY ANGIOGRAPHY;  Surgeon: Katha Cabal, MD;  Location: [place] [facility];  Service: Cardiovascular;  Laterality: Right;   WOUND DEBRIDEMENT Left 12/21/2018   Procedure: DEBRIDEMENT WOUND;  Surgeon: Katha Cabal, MD;  Location: [facility];  Service: Vascular;  Laterality: Left;   WOUND DEBRIDEMENT Left 12/30/2018   Procedure: DEBRIDEMENT WOUND WITH VAC PLACEMENT (LEFT UPPER EXTREMITY);  Surgeon: Katha Cabal, MD;  Location: [facility];  Service: Vascular;  Laterality: Left;    Social History:  reports  that she quit smoking about 40 years ago. Her smoking use included cigarettes. She has a 1.50 pack-year smoking history. She has never used smokeless tobacco. She reports current alcohol use. She reports that she does not currently use drugs after having used the following drugs: Marijuana.   Allergies  Allergen Reactions   Morphine Itching    Tolerated hydromorphone on 07/2020   Shellfish Allergy Anaphylaxis and Swelling   Diazepam Other (See Comments)    "felt like out of body experience"    Family History  Problem Relation Age of Onset   Colon cancer Mother    Heart attack Other    Heart attack Maternal Grandmother    Hypertension Sister    Hypertension Brother    Diabetes Paternal Grandmother    Breast cancer Neg Hx     Family history reviewed and not pertinent    Prior to Admission medications   Medication Sig Start Date End Date Taking? Authorizing Provider   albuterol (VENTOLIN HFA) 108 (90 Base) MCG/ACT inhaler Inhale 2 puffs into the lungs every 4 (four) hours as needed for wheezing or shortness of breath. 06/28/20   [name], PA-C  allopurinol (ZYLOPRIM) 100 MG tablet TAKE 1 TABLET BY MOUTH ONCE DAILY. 11/05/20   Birdie Sons, MD  aspirin 81 MG EC tablet Take 1 tablet (81 mg total) by mouth daily. 09/20/20   Thurnell Lose, MD  atorvastatin (LIPITOR) 80 MG tablet TAKE 1 TABLET BY MOUTH ONCE DAILY AT 6PM Patient taking differently: Take 80 mg by mouth every evening. 07/08/20   [name], PA-C  AURYXIA 1 GM 210 MG(Fe) tablet Take 2 tablets (420 mg total) by mouth 3 (three) times daily with meals. 05/03/20   [name], PA-C  B Complex-C-Folic Acid (RENA-VITE RX PO) Take 1 mg by mouth daily.    [provider]  B Complex-C-Folic Acid (RENA-VITE RX) 1 MG TABS Take 1 mg by mouth daily. 10/15/20   [provider]  buPROPion (WELLBUTRIN SR) 150 MG 12 hr tablet Take 1 tablet (150 mg total) by mouth 2 (two) times daily. 05/03/20   [name], PA-C  carvedilol (COREG) 25 MG tablet Take 1 tablet (25 mg total) by mouth 2 (two) times daily with a meal. 10/29/20   Bacigalupo, Dionne Bucy, MD  colchicine 0.6 MG tablet Take 1 tablet (0.6 mg total) by mouth once a week for 15 days. 09/29/20 10/14/20  Thurnell Lose, MD  Continuous Blood Gluc Receiver (FREESTYLE LIBRE 14 DAY READER) DEVI To check blood sugar ACHS 09/27/20   [name], PA-C  Continuous Blood Gluc Sensor (FREESTYLE LIBRE 14 DAY SENSOR) MISC To check blood sugar ACHS; change every 14 days 09/27/20   [name], PA-C  cyclobenzaprine (FLEXERIL) 10 MG tablet Take 1 tablet (10 mg total) by mouth 2 (two) times daily as needed for muscle spasms. 10/24/20   Corena Herter, PA-C  diclofenac Sodium (VOLTAREN) 1 % GEL Apply 2 g topically 4 (four) times daily. 10/24/20   Corena Herter, PA-C  dicyclomine (BENTYL) 20 MG tablet Take 1  tablet (20 mg total) by mouth in the morning and at bedtime. 09/27/20   [name], PA-C  docusate sodium (COLACE) 100 MG capsule Take 1 capsule (100 mg total) by mouth 2 (two) times daily as needed for mild constipation. 09/20/17   Gouru, Illene Silver, MD  doxepin (SINEQUAN) 10 MG capsule TAKE 1 CAPSULE(10 MG) BY MOUTH FOUR TIMES DAILY  AS NEEDED FOR ITCHING Patient taking differently: Take 10 mg by mouth 2 (two) times daily. 05/03/20   [name], PA-C  fluticasone (FLONASE) 50 MCG/ACT nasal spray Place 2 sprays into both nostrils daily as needed for allergies or rhinitis. 09/28/17   [name], PA-C  fluticasone furoate-vilanterol (BREO ELLIPTA) 200-25 MCG/INH AEPB Inhale 1 puff into the lungs daily. 08/30/20   [name], PA-C  hydrALAZINE (APRESOLINE) 100 MG tablet Take 100 mg by mouth 3 (three) times daily. 10/07/20   [provider]  hydrOXYzine (ATARAX/VISTARIL) 25 MG tablet Take 1 tablet (25 mg total) by mouth 4 (four) times daily. 12/03/20   Bacigalupo, Dionne Bucy, MD  insulin detemir (LEVEMIR FLEXTOUCH) 100 UNIT/ML FlexPen Inject 60 Units into the skin 2 (two) times daily. 05/24/20   [name], PA-C  insulin glargine (LANTUS) 100 UNIT/ML Solostar Pen 25 iu sq qd and increase by 2 iu for every 3 day average greater than 150 03/04/21   Chrismon, Dennis E, PA-C  insulin lispro (HUMALOG KWIKPEN) 100 UNIT/ML KwikPen Inject 25 Units into the skin with breakfast, with lunch, and with evening meal. Patient taking differently: Inject 7 Units into the skin 2 (two) times daily. 05/14/20   [name], PA-C  lidocaine-prilocaine (EMLA) cream Apply 1 application topically as needed. Patient taking differently: Apply 1 application topically as needed (dialysis days). 03/27/19   Schnier, Dolores Lory, MD  loratadine (CLARITIN) 10 MG tablet TAKE 1 TABLET BY MOUTH ONCE DAILY. Patient taking differently: Take 10 mg by mouth daily as needed for allergies. 07/15/20    [name], PA-C  montelukast (SINGULAIR) 10 MG tablet TAKE ONE TABLET BY MOUTH AT BEDTIME. Patient taking differently: Take 10 mg by mouth at bedtime as needed (allergies). 07/15/20   [name], PA-C  nitroGLYCERIN (NITROSTAT) 0.4 MG SL tablet Place 1 tablet (0.4 mg total) under the tongue every 5 (five) minutes x 3 doses as needed for chest pain. 05/03/20   [name], PA-C  Nutritional Supplements (FEEDING SUPPLEMENT, NEPRO CARB STEADY,) LIQD Take 237 mLs by mouth daily. 02/01/20   Loletha Grayer, MD  nystatin cream (MYCOSTATIN) Apply 1 application topically 2 (two) times daily. Patient taking differently: Apply 1 application topically daily as needed for dry skin. 08/10/19   [name], PA-C  omeprazole (PRILOSEC) 20 MG capsule Take 20 mg by mouth daily. 10/07/20   [provider]  oxyCODONE (ROXICODONE) 5 MG immediate release tablet Take 1 tablet (5 mg total) by mouth every 12 (twelve) hours as needed for severe pain. 08/30/20   [name], PA-C  oxyCODONE-acetaminophen (PERCOCET/ROXICET) 5-325 MG tablet Take 1 tablet by mouth 4 (four) times daily as needed for pain. 10/23/20   [provider]  pantoprazole (PROTONIX) 40 MG tablet Take 1 tablet (40 mg total) by mouth 2 (two) times daily. 09/24/20   Thurnell Lose, MD  pregabalin (LYRICA) 150 MG capsule Take 1 capsule (150 mg total) by mouth 2 (two) times daily. 09/27/20   [name], PA-C  SENNA-PLUS 8.6-50 MG tablet TAKE ONE TABLET BY MOUTH AT BEDTIME. 07/15/20   [name], PA-C  sevelamer carbonate (RENVELA) 800 MG tablet Take 800 mg by mouth 3 (three) times daily. 08/21/20 08/21/21  [provider]  sucralfate (CARAFATE) 1 g tablet Take 1 tablet (1 g total) by mouth 2 (two) times daily for 26 days. 09/24/20 10/20/20  Thurnell Lose, MD  torsemide (DEMADEX) 20 MG tablet  TAKE (2) TABLETS BY MOUTH TWICE DAILY. 01/24/21   Virginia Crews, MD   triamcinolone cream (KENALOG) 0.1 % APPLY TO AFFECTED AREA TWICE DAILY Patient taking differently: Apply 1 application topically 2 (two) times daily as needed (rash). 06/17/18   Burnette, Clearnce Sorrel, PA-C  ULTICARE SHORT PEN NEEDLES 31G X 8 MM MISC USE THREE TIMES DAILY WITH INSULIN 01/21/21   Jerrol Banana., MD  valACYclovir (VALTREX) 500 MG tablet TAKE (1) TABLET BY MOUTH EVERY OTHER DAY. 01/24/21   Bacigalupo, Dionne Bucy, MD  VICTOZA 18 MG/3ML SOPN Start with 0.68m daily and increase by 0.637mper week until max dose of 1.8 mg dose achieved. 02/19/21   GiJerrol Banana MD  Vitamin D, Ergocalciferol, (DRISDOL) 1.25 MG (50000 UNIT) CAPS capsule TAKE 1 CAPSULE BY MOUTH ONCE A MONTH 11/04/20   FiBirdie SonsMD  gabapentin (NEURONTIN) 100 MG capsule Take 1 capsule (100 mg total) by mouth 3 (three) times daily. 08/19/17 02/03/19  PaFritzi MandesMD     Objective    Physical Exam: Vitals:   04/20/21 2105 04/20/21 2130 04/20/21 2200 04/20/21 2230  BP: (!) 119/51 124/61 (!) 114/56 (!) 103/46  Pulse: 87 87 87 87  Resp: (!) _0 Temp:      TempSrc:      SpO2: 100% 100% 98% 98%  Weight:      Height:        General: appears to be stated age; alert, oriented to person/self, but not to place or time Skin: warm, dry, no rash Head:  AT/[place] Mouth:  Oral mucosa membranes appear moist, normal dentition Neck: supple; trachea midline Heart:  RRR; did not appreciate any M/R/G Lungs: CTAB, did not appreciate any wheezes, rales, or rhonchi Abdomen: + BS; soft, ND, NT Vascular: 2+ pedal pulses b/l; 2+ radial pulses b/l Extremities: Trace edema bilateral lower extremities, no muscle wasting Neuro: strength and sensation intact in upper and lower extremities b/l    Labs on Admission: I have personally reviewed following labs and imaging studies  CBC: Recent Labs  Lab 04/20/21 2030 04/20/21 2047  WBC 5.4  --   NEUTROABS 3.9  --   HGB 12.8 18.7*  HCT 40.4 55.0*  MCV 97.3  --    PLT 106*  --    Basic Metabolic Panel: Recent Labs  Lab 04/20/21 2030 04/20/21 2047  NA 134* 134*  K 4.8 4.8  CL 92* 95*  CO2 25  --   GLUCOSE 191* 188*  BUN 49* 72*  CREATININE 10.60* 11.00*  CALCIUM 7.8*  --    GFR: Estimated Creatinine Clearance: 7.3 mL/min (A) (by C-G formula based on SCr of 11 mg/dL (H)). Liver Function Tests: Recent Labs  Lab 04/20/21 2030  AST 44*  ALT 26  ALKPHOS 153*  BILITOT 1.1  PROT 6.7  ALBUMIN 2.8*   No results for input(s): LIPASE, AMYLASE in the last 168 hours. No results for input(s): AMMONIA in the last 168 hours. Coagulation Profile: No results for input(s): INR, PROTIME in the last 168 hours. Cardiac Enzymes: No results for input(s): CKTOTAL, CKMB, CKMBINDEX, TROPONINI in the last 168 hours. BNP (last 3 results) No results for input(s): PROBNP in the last 8760 hours. HbA1C: No results for input(s): HGBA1C in the last 72 hours. CBG: No results for input(s): GLUCAP in the last 168 hours. Lipid Profile: No results for input(s): CHOL, HDL, LDLCALC, TRIG, CHOLHDL, LDLDIRECT in the last 72 hours. Thyroid Function Tests:  No results for input(s): TSH, T4TOTAL, FREET4, T3FREE, THYROIDAB in the last 72 hours. Anemia Panel: No results for input(s): VITAMINB12, FOLATE, FERRITIN, TIBC, IRON, RETICCTPCT in the last 72 hours. Urine analysis:    Component Value Date/Time   COLORURINE YELLOW 03/05/2021 1425   APPEARANCEUR HAZY (A) 03/05/2021 1425   LABSPEC 1.012 03/05/2021 1425   PHURINE 5.0 03/05/2021 1425   GLUCOSEU >=500 (A) 03/05/2021 1425   HGBUR NEGATIVE 03/05/2021 [place] 03/05/2021 1425   BILIRUBINUR neg 12/17/2016 1538   KETONESUR NEGATIVE 03/05/2021 1425   PROTEINUR 30 (A) 03/05/2021 1425   UROBILINOGEN 0.2 12/17/2016 1538   UROBILINOGEN 0.2 05/29/2010 2100   NITRITE NEGATIVE 03/05/2021 1425   LEUKOCYTESUR TRACE (A) 03/05/2021 1425    Radiological Exams on Admission: CT ABDOMEN PELVIS WO  CONTRAST  Result Date: 04/20/2021 CLINICAL DATA:  Short of breath, lethargy, abdominal distension EXAM: CT ABDOMEN AND PELVIS WITHOUT CONTRAST TECHNIQUE: Multidetector CT imaging of the abdomen and pelvis was performed following the standard protocol without IV contrast. COMPARISON:  01/01/2021 FINDINGS: Lower chest: Hypoventilatory changes at the lung bases. No acute airspace disease or effusion. Hepatobiliary: Cholecystectomy. Unenhanced imaging of the liver is unremarkable. Pancreas: Unremarkable. No pancreatic ductal dilatation or surrounding inflammatory changes. Spleen: Normal in size without focal abnormality. Adrenals/Urinary Tract: Stable left renal cortical cyst. No urinary tract calculi or obstructive uropathy. Bladder is unremarkable. Adrenals are normal. Stomach/Bowel: No bowel obstruction or ileus. Postsurgical changes are seen from diverting colostomy left mid abdomen. No bowel wall thickening or inflammatory change. Vascular/Lymphatic: Aortic atherosclerosis. No enlarged abdominal or pelvic lymph nodes. Reproductive: Uterus and bilateral adnexa are unremarkable. Other: No free fluid or free gas. Musculoskeletal: No acute or destructive bony lesions. Stable postsurgical changes right inguinal region. Reconstructed images demonstrate no additional findings. IMPRESSION: 1. No acute intra-abdominal or intrapelvic process. 2. Stable postsurgical changes from diverting colostomy. No bowel obstruction or ileus. 3.  Aortic Atherosclerosis (ICD10-I70.0). Electronically Signed   By: Randa Ngo M.D.   On: 04/20/2021 23:07   CT HEAD WO CONTRAST (5MM)  Result Date: 04/20/2021 CLINICAL DATA:  Mental status change, unknown cause EXAM: CT HEAD WITHOUT CONTRAST TECHNIQUE: Contiguous axial images were obtained from the base of the skull through the vertex without intravenous contrast. COMPARISON:  09/26/2015 FINDINGS: Brain: Diffuse cerebral atrophy. No acute intracranial abnormality. Specifically, no  hemorrhage, hydrocephalus, mass lesion, acute infarction, or significant intracranial injury. Vascular: No hyperdense vessel or unexpected calcification. Skull: No acute calvarial abnormality. Sinuses/Orbits: No acute findings Other: None IMPRESSION: Cerebral atrophy.  No acute intracranial abnormality. Electronically Signed   By: Rolm Baptise M.D.   On: 04/20/2021 23:04   DG Chest Port 1 View  Result Date: 04/20/2021 CLINICAL DATA:  Short of breath, hypoxic, missed dialysis EXAM: PORTABLE CHEST 1 VIEW COMPARISON:  03/05/2021 FINDINGS: Single frontal view of the chest demonstrates stable enlargement of the cardiac silhouette. Chronic vascular congestion without airspace disease, effusion, or pneumothorax. No acute bony abnormalities. IMPRESSION: 1. Chronic vascular congestion without overt edema. Electronically Signed   By: Randa Ngo M.D.   On: 04/20/2021 20:44     EKG: Independently reviewed, with result as described above.    Assessment/Plan   ; SYNOPSIS / BIG PICTURE: 65 y.o. female admitted with acute hypoxic respiratory distress in the setting of severe COVID-19. On decadron and remdesivir.  Missed most recent HD session, but no evidence of volume overload on cxr. Will need non-urgent HD on 04/21/21  ;     Principal  Problem:   COVID-19 virus infection Active Problems:   Uncontrolled type 2 diabetes mellitus with hyperosmolar nonketotic hyperglycemia (HCC)   Shortness of breath   Essential hypertension   GERD (gastroesophageal reflux disease)   Acute metabolic encephalopathy   ESRD (end stage renal disease) on dialysis (HCC)   Acute respiratory failure with hypoxia (HCC)   Severe sepsis (HCC)     #) Severe COVID-19 pneumonia: diagnosis on the basis of: 2 to 3 days of progressive shortness of breath associated with new onset nonproductive cough, objective fever, and finding of positive COVID-19 PCR were checked in the ED today. Additionally, in the context of no known baseline  supplemental O2 requirements, the patient is requiring 3 L nasal cannula in order to maintain O2 sats greater than or equal to 94%. In setting of this acute hypoxia, criteria are met from patient's COVID-19 infection to be considered severe in nature. Consequently, there is a Grade 2c rec for systemic corticosteroids, which is further supported by treatment guidance recommendations from [facility]'s Covid Treatment Guidelines.   Of note, in the setting of the patient's age greater than 59 as well as multiple comorbidities that include diabetes and chronic diastolic heart failure, this patient meets criteria to be considered high risk for a more complicated clinical course of COVID-19 infection, including increased probability for progression of the severity associated with this infection. Therefore, in the setting of symptomatic COVID-19 infection requiring hospitalization for further evaluation and management thereof in this patient with the aforementioned high risk criteria who is felt to be early in the course of their infection given onset of respiratory symptoms starting less than 5 days ago, indications are met for initiation of remdesivir per treatment guidance recommendations from [facility]'s Covid Treatment Guidelines. Of note, ALT found to be less than 220. Therefore, there is no contraindication for initiation of remdesivir on the basis of transaminitis.   Will closely monitor ensuing degree of hypoxia as well as associated trend in supplemental oxygen requirements. Does not appear to me indications for initiation of Tocilizumab at this time, as further described below. No known chronic underlying pulmonary pathology. Of note, in the setting of a history of diabetes, will initiate daily linagliptin as DPP-4 inhibitors have been shown to reduce mortality in patients with DM2 and a COVID-19.  Of note, chest x-Gallaher shows no interval changes relative to most recent plain film of the chest from August  2022, and specifically demonstrates no evidence of acute cardiopulmonary process.    Plan: Airborne and contact precautions. Monitor continuous pulse oximetry and monitor on telemetry. prn supplemental O2 to maintain O2 sats greater than or equal to 94%. Proning protocol initiated. PRN albuterol inhaler.  PRN acetaminophen for fever. Start dexamethasone and remdesivir, as above. Check and trend inflammatory markers (fibrinogen, d dimer or fibrin derivatives, crp, ferritin, LDH). Check serum magnesium and phosphorus levels. Check CMP and CBC in the morning.  Flutter valve and incentive spirometry. If rapid progression of supplemental oxygen demand, development of need for high flow O2, or worsening hypoxemia with CRP > 10, would consider initiation of Tocilizumab at that point. Check procalcitonin, which, if non-elevated in the context of the pro inflammatory state associated with COVID-19, would provide a high degree of negative predictive value that would further decrease the likelihood of any contribution from bacterial pneumonia.  In setting of DM2, will start linagliptin 5 mg PO Qdaily for associated mortality benefit, as above.       #) Severe Sepsis: Appears  to be on the basis of COVID-19, as above, with SIRS criteria met via presenting objective fever, and tachypnea.  Patient's sepsis meets criteria to be consider severe in nature on the basis of both acute hypoxic respiratory distress as well as worsening acute encephalopathy. No evidence of associated hypotension thus far.  Of note, presenting lactate nonelevated at 1.4.  In the absence of LA level greater than or equal to 4.0 and in the absence of hypotension, criteria are not met at this time for initiation of a 30 mL/kg IVF bolus. No evidence of additional underlying infectious process beyond COVID-19, and bacterial pneumonia is felt to be less likely at this time, with procalcitonin level pending, as further detailed above.  As patient's  sepsis is felt to be viral in nature, will refrain from antibiotic coverage at this time.  Blood cultures x2 were checked in the ED today.  Plan: Work-up and management of COVID-19 infection, as above.  Repeat CBC with differential in the morning.  PRN acetaminophen for fever.  Check procalcitonin, as above. Will refrain from antibiotics for now per above rationale.         #) Acute hypoxic respiratory distress: in the context of no known baseline supplemental oxygen requirements, presenting O2 sat noted to be in the high 80s on room air, with subsequent improvement into the mid to high 90s on 3 L nasal cannula, thereby meeting criteria for acute hypoxic respiratory distress as opposed to acute hypoxic respiratory failure at this time. Appears to be on the basis of COVID-19 infection, as above. No known chronic underlying pulmonary conditions. ACS is felt to be less likely at this time in the absence of any recent chest pain and in the context of presenting EKG showing no evidence of acute ischemic process. No clinical or radiographic evidence of suggest acutely decompensated heart failure at this time, but will check BNP. While there is increased risk for acute PE in the setting of COVID-19 infection, clinically, this appears to be less likely at this time. If rapid progression of supplemental oxygen demand or if development of need for high flow O2, would consider initiation of Tocilizumab at that point.   Plan: further evaluation and management of presenting COVID-19 infection, as above, including monitoring of continuous pulse oximetry with prn supplemental O2 to maintain O2 sats greater than or equal to 94%. monitor on telemetry. Trending of inflammatory markers, as above. Check CMP and CBC in the morning. Check serum Mg and Phos levels. Flutter valve and incentive spirometry. PRN albuterol inhaler.  Decadron and remdesivir, as above.  Add on procalcitonin level, as above.  Check BNP.  Will need  nonurgent hemodialysis on 04/21/2021 in the setting of having missed her scheduled hemodialysis session on 04/20/2021, as further detailed above.      #) Acute metabolic encephalopathy: 1 to 2 days of confusion relative to baseline mental status, as further quantified above . patient's acute encephalopathy appears to be on the basis of physiologic stressors stemming from presenting severe sepsis d/t severe COVID-19 infection, as further described above.  No obvious additional contributory underlying infectious process at this time, with procalcitonin level pending at this time.  The patient does not appear to be overtly uremic, there may be some relative metabolic contribution to her presenting confusion in the setting of having missed her most recent hemodialysis session, as further detailed above. Anticipate ensuing nephrology consultation for assistance with arranging for hemodialysis on 8 9 urgent basis later on 04/21/2021.  Otherwise,  no additional overt metabolic/electrolyte contributions at this time.  Potential contributory/confounding pharmacologic influences over the patient's presenting acute encephalopathy include the presence of several central acting medications at home, which include Wellbutrin, Flexeril, scheduled hydroxyzine in the setting of generalized anxiety disorder, Lyrica.  We will hold each of these home central acting medications for now to eliminate this as a potential confounding variable related to her presenting acute encephalopathy.  No overt acute focal neurologic deficits to suggest a contribution from an underlying acute CVA, while noncontrast CT of the head showed no evidence of acute intracranial process.  Seizures are also felt to be less likely. Will check VBG to evaluate for any contribution from hypercapnic encephalopathy.      Plan: fall precautions. Repeat CMP/CBC in the AM. Check magnesium level. check VBG, TSH, MMA.  Further evaluation management presenting  severe sepsis due to severe COVID-19 infection, as further detailed above.  Hold home Wellbutrin, Flexeril, scheduled hydroxyzine, Lyrica, as above.  Check ionized calcium level. Anticipate nephrology consult for assistance with scheduling non-urgent HD, as above.       #) End-stage renal disease: On hemodialysis on Tuesday, Thursday, Saturday schedule, having missed most recent scheduled HD session on 04/19/2021 when she was in local emergency department all day, as further detailed above, with reported most recent completed hemodialysis session on Thursday, 04/17/2021. anticipate nephrology consult for assistance with scheduling non-urgent HD, as above.  At this time, not overtly uremic, as presenting acute encephalopathy suspected to be more on the basis of severe sepsis due to severe COVID-19 infection.  No evidence of hyperkalemia, no evidence of acute volume overload.  Overall, there appears to be no overt indication for urgent overnight hemodialysis.   Plan: Monitor strict I's and O's and daily weights.  Check serum magnesium and phosphorus levels.  CMP in the morning.  Check ionized calcium level.  Continue home Renvela.Anticipate nephrology consult for assistance with scheduling non-urgent HD, as above.       #) Type 2 Diabetes Mellitus:  Home insulin regimen: Levemir 60 units subcu twice daily as well as 3 times daily Humalog. presenting blood sugar 194.  Recently complicated by diabetic peripheral polyneuropathy, on Lyrica as an outpatient, which will be held for now to home at any confounding contribution to be opposed to her presenting acute encephalopathy.    Plan: In the setting of end-stage renal disease and in attempting to reduce risk for ensuing development of hypoglycemia, will start approximately one third of her home basal insulin dose at this time in the form of Levemir 20 units subcu twice daily.  accuchecks QAC and HS with low-dose sliding scale insulin.      #) Chronic  diastolic heart failure: documented history of such, with most recent echocardiogram performed in January 2020, as further detailed above, but notable for evidence of grade 1 diastolic dysfunction. No clinical evidence to suggest acutely decompensated heart failure at this time, including per review of presenting chest x-Rigor, which is notable given that patient missed her most recent scheduled hemodialysis session, as further detailed above.    Plan: monitor strict I's & O's and daily weights. Repeat BMP in the morning. Check serum magnesium level.  Nephrology consult for routine scheduled hemodialysis, as above monitor on telemetry.  Monitor continuous pulse oximetry.      #) Hyperlipidemia: On high intensity atorvastatin as an outpatient.  Plan: Continue home statin.      #) GERD: On omeprazole as an outpatient.  As PPIs can be  associated with mental status, and presenting acute encephalopathy, will hold this medication to reduce risk of confounding contribution to presenting confusion.  Plan: Hold home PPI for now, as above.      #) Essential hypertension: Outpatient hypertensive regimen includes hydralazine blood pressures in the ED had been noted to be normotensive.   Plan: In the setting of presenting severe sepsis, will hold home hydralazine for now.  Close monitoring ensuing blood pressure via routine vital signs.     DVT prophylaxis: SCD's   Code Status: Full code Disposition Plan: Per Rounding Team Consults called: none;  Admission status: Inpatient , pcu   Of note, this patient was added by me to the following Admit List/Treatment Team:  mcadmits.   Of note, the Adult Admission Order Set (Multimorbid order set) was used by me in the admission process for this patient.  PLEASE NOTE THAT [employer] SOFTWARE WAS USED IN THE CONSTRUCTION OF THIS NOTE.   Rhetta Mura DO [facility] Pager [phone] From [place]   04/20/2021, 11:33 PM

## 2021-04-20 NOTE — ED Provider Notes (Addendum)
[facility] EMERGENCY DEPARTMENT Provider Note   CSN: [id number] Arrival date & time: 04/20/21  1953     History Chief Complaint  Patient presents with   Shortness of Breath    [name] is a 65 y.o. female.  HPI This is a 65 year old patient with a history of CAD with stenting, CHF, ESRD on dialysis, diabetes who presents with shortness of breath and altered mental status.  Patient is unable to provide significant history as she is altered.  Spoke with patient's daughter on the phone who reports that she was seen yesterday at outside hospital ED for shortness of breath.  Was treated for hyperglycemia and reportedly had new oxygen requirement at that time but was discharged home.  Because of this ED visit patient missed dialysis yesterday, typical schedule Tuesday Thursday Saturday.  Last dialysis was on Thursday.  Daughter reports shortness of breath for at least 2 days, associated with fever.  Denies cough, congestion, vomiting, diarrhea, abdominal pain.  No known falls. Has been compliant with medications.   Past Medical History:  Diagnosis Date   Anemia    Aortic stenosis    Echo 8/18: mean 13, peak 28, LVOT/AV mean velocity 0.51   Arthritis    Asthma    As a child    Bronchitis    CAD (coronary artery disease)    a. 09/2016: 50% Ost 1st Mrg stenosis, 50% 2nd Mrg stenosis, 20% Mid-Cx, 95% Prox LAD, 40% mid-LAD, and 10% dist-LAD stenosis. Staged PCI with DES to Prox-LAD.    Chronic combined systolic and diastolic CHF (congestive heart failure) ([place]) 2011   echo 2/18: EF 55-60, normal wall motion, grade 2 diastolic dysfunction, trivial AI // echo 3/18: Septal and apical HK, EF 45-50, normal wall motion, trivial AI, mild LAE, PASP 38 // echo 8/18: EF 60-65, normal wall motion, grade 1 diastolic dysfunction, calcified aortic valve leaflets, mild aortic stenosis (mean 13, peak 28, LVOT/AV mean velocity 0.51), mild AI, moderate MAC, mild LAE, trivial TR    Chronic  kidney disease    STAGE 4   Chronic kidney disease on chronic dialysis (HCC)    t, th, sat   Complication of anesthesia    Depression    Diabetes mellitus Dx 1989   Elevated lipids    GERD (gastroesophageal reflux disease)    Gout    Heart murmur    asymptomatic   Hepatitis C Dx 2013   Hypertension Dx 1989   Infected surgical wound    Lt arm   Myocardial infarction ([place]) 07/2015   Obesity    Pancreatitis 2013   Pneumonia    Refusal of blood transfusions as patient is Jehovah's Witness    pt states she is not [place] witness and does not refuse blood products   Tendinitis    Tremors of nervous system    LEFT HAND   Ulcer 2010    Patient Active Problem List   Diagnosis Date Noted   Upper GI bleed 09/17/2020   Prolonged QT interval 09/17/2020   COPD (chronic obstructive pulmonary disease) ([place]) 09/17/2020   Acute pulmonary edema ([place]) 09/14/2020   Acute respiratory failure with hypoxia ([place]) 09/13/2020   Allergy, unspecified, initial encounter 03/29/2020   Anaphylactic shock, unspecified, initial encounter 03/29/2020   Volume overload 01/30/2020   Pain in left upper arm 01/18/2020   Cellulitis 01/16/2020   Anemia due to end stage renal disease ([place]) 01/16/2020   Diabetic polyneuropathy associated with type 2 diabetes mellitus (  [place]) 11/06/2019   Nystagmus 11/06/2019   Intermittent claudication ([place]) 11/06/2019   Vertigo of central origin 11/06/2019   Weakness    Hypervolemia associated with renal insufficiency 10/31/2019   BMI 45.0-49.9, adult ([place]) 10/18/2019   Ascending aortic aneurysm 03/27/2019   Puncture wound without foreign body of left forearm, initial encounter 01/02/2019   Non-healing wound of upper extremity 12/28/2018   Unspecified open wound of left upper arm, initial encounter 54/56/2563   Complication from renal dialysis device 10/20/2018   Iron deficiency anemia, unspecified 10/18/2018   ESRD (end stage renal disease) on dialysis ([place]) 06/01/2018    ARF (acute renal failure) ([place]) 05/09/2018   Colon cancer screening    LGSIL on Pap smear of cervix 01/27/2018   Gout 12/25/2017   AKI (acute kidney injury) ([place]) 12/24/2017   Hyperglycemia 12/24/2017   Chronic diastolic heart failure ([place]) 09/30/2017   Lymphedema 09/30/2017   Aortic stenosis    Acute pain of both knees 02/12/2017   Chronic gout due to renal impairment of multiple sites without tophus 02/12/2017   Esophageal dysphagia 02/12/2017   Osteoarthritis 01/22/2017   Diabetic hyperosmolar non-ketotic state ([place]) 12/13/2016   CAD (coronary artery disease) 12/13/2016   Hyperlipidemia 12/13/2016   Hyperphosphatemia 89/37/3428   Acute diastolic CHF (congestive heart failure) (HCC)    Hyperkalemia 08/21/2016   Asthma 11/29/2015   Depression 09/05/2015   GERD (gastroesophageal reflux disease) 03/25/2015   Environmental allergies 03/14/2015   SOB (shortness of breath) 02/15/2015   Chronic hepatitis C without hepatic coma ([place]) 01/31/2015   Diabetic neuropathy ([place]) 01/31/2015   OSA (obstructive sleep apnea) 01/01/2015   Poor dentition 11/13/2014   Essential hypertension 10/08/2014   Morbid (severe) obesity due to excess calories ([place]) 10/08/2014   Localized, primary osteoarthritis 01/25/2014   Family history of diabetes mellitus 03/31/2013   Pain in limb 03/31/2013   Uncontrolled type 2 diabetes mellitus with hyperosmolar nonketotic hyperglycemia ([place]) 03/25/2012    Class: Chronic    Past Surgical History:  Procedure Laterality Date   A/V FISTULAGRAM Left 04/11/2019   Procedure: A/V FISTULAGRAM;  Surgeon: Katha Cabal, MD;  Location: [place] [facility];  Service: Cardiovascular;  Laterality: Left;   A/V FISTULAGRAM Left 06/02/2019   Procedure: A/V FISTULAGRAM;  Surgeon: Katha Cabal, MD;  Location: [place] [facility];  Service: Cardiovascular;  Laterality: Left;   APPLICATION OF WOUND VAC Left 08/14/2017   Procedure: APPLICATION OF WOUND VAC Exchange;   Surgeon: Robert Bellow, MD;  Location: [facility];  Service: General;  Laterality: Left;   APPLICATION OF WOUND VAC Left 12/21/2018   Procedure: APPLICATION OF WOUND VAC;  Surgeon: Katha Cabal, MD;  Location: [facility];  Service: Vascular;  Laterality: Left;   AV FISTULA PLACEMENT Left 08/19/2018   Procedure: ARTERIOVENOUS (AV) FISTULA CREATION ( BRACHIOBASILIC );  Surgeon: Katha Cabal, MD;  Location: [facility];  Service: Vascular;  Laterality: Left;   BASCILIC VEIN TRANSPOSITION Left 11/18/2018   Procedure: BASCILIC VEIN TRANSPOSITION;  Surgeon: Katha Cabal, MD;  Location: [facility];  Service: Vascular;  Laterality: Left;   BIOPSY  09/20/2020   Procedure: BIOPSY;  Surgeon: Carol Ada, MD;  Location: [place];  Service: Endoscopy;;   CHOLECYSTECTOMY     COLONOSCOPY WITH PROPOFOL N/A 02/03/2018   Procedure: COLONOSCOPY WITH PROPOFOL;  Surgeon: Lin Landsman, MD;  Location: [facility] END[facility];  Service: Gastroenterology;  Laterality: N/A;   CORONARY ANGIOPLASTY  07/2015   STENT   CORONARY STENT INTERVENTION  N/A 09/18/2016   Procedure: Coronary Stent Intervention;  Surgeon: Troy Sine, MD;  Location: [place] CV LAB;  Service: Cardiovascular;  Laterality: N/A;   DIALYSIS/PERMA CATHETER INSERTION N/A 05/10/2018   Procedure: DIALYSIS/PERMA CATHETER INSERTION;  Surgeon: Katha Cabal, MD;  Location: [place] [facility];  Service: Cardiovascular;  Laterality: N/A;   DRESSING CHANGE UNDER ANESTHESIA Left 08/15/2017   Procedure: exploration of wound for bleeding;  Surgeon: Robert Bellow, MD;  Location: [facility];  Service: General;  Laterality: Left;   ESOPHAGOGASTRODUODENOSCOPY (EGD) WITH PROPOFOL N/A 02/03/2018   Procedure: ESOPHAGOGASTRODUODENOSCOPY (EGD) WITH PROPOFOL;  Surgeon: Lin Landsman, MD;  Location: [facility];  Service: Gastroenterology;  Laterality: N/A;   ESOPHAGOGASTRODUODENOSCOPY (EGD) WITH PROPOFOL N/A 09/20/2020   Procedure:  ESOPHAGOGASTRODUODENOSCOPY (EGD) WITH PROPOFOL;  Surgeon: Carol Ada, MD;  Location: [place];  Service: Endoscopy;  Laterality: N/A;   EYE SURGERY  11/17/2018   INCISION AND DRAINAGE ABSCESS Left 08/12/2017   Procedure: INCISION AND DRAINAGE ABSCESS;  Surgeon: Robert Bellow, MD;  Location: [facility];  Service: General;  Laterality: Left;   KNEE ARTHROSCOPY     LEFT HEART CATH N/A 09/18/2016   Procedure: Left Heart Cath;  Surgeon: Troy Sine, MD;  Location: [place] [facility];  Service: Cardiovascular;  Laterality: N/A;   LEFT HEART CATH AND CORONARY ANGIOGRAPHY N/A 09/16/2016   Procedure: Left Heart Cath and Coronary Angiography;  Surgeon: Burnell Blanks, MD;  Location: [facility];  Service: Cardiovascular;  Laterality: N/A;   LEFT HEART CATH AND CORONARY ANGIOGRAPHY N/A 04/29/2017   Procedure: LEFT HEART CATH AND CORONARY ANGIOGRAPHY;  Surgeon: Nelva Bush, MD;  Location: [facility];  Service: Cardiovascular;  Laterality: N/A;   LOWER EXTREMITY ANGIOGRAPHY Right 03/08/2018   Procedure: LOWER EXTREMITY ANGIOGRAPHY;  Surgeon: Katha Cabal, MD;  Location: [facility];  Service: Cardiovascular;  Laterality: Right;   TUBAL LIGATION     TUBAL LIGATION     UPPER EXTREMITY ANGIOGRAPHY Right 09/19/2019   Procedure: UPPER EXTREMITY ANGIOGRAPHY;  Surgeon: Katha Cabal, MD;  Location: [place] [facility];  Service: Cardiovascular;  Laterality: Right;   WOUND DEBRIDEMENT Left 12/21/2018   Procedure: DEBRIDEMENT WOUND;  Surgeon: Katha Cabal, MD;  Location: [facility];  Service: Vascular;  Laterality: Left;   WOUND DEBRIDEMENT Left 12/30/2018   Procedure: DEBRIDEMENT WOUND WITH VAC PLACEMENT (LEFT UPPER EXTREMITY);  Surgeon: Katha Cabal, MD;  Location: [facility];  Service: Vascular;  Laterality: Left;     OB History     Gravida  4   Para  3   Term  3   Preterm      AB  1   Living  3      SAB  1   IAB      Ectopic       Multiple      Live Births  3           Family History  Problem Relation Age of Onset   Colon cancer Mother    Heart attack Other    Heart attack Maternal Grandmother    Hypertension Sister    Hypertension Brother    Diabetes Paternal Grandmother    Breast cancer Neg Hx     Social History   Tobacco Use   Smoking status: Former    Packs/day: 0.25    Years: 6.00    Pack years: 1.50    Types: Cigarettes  Quit date: 10/25/1980    Years since quitting: 40.5   Smokeless tobacco: Never  Vaping Use   Vaping Use: Never used  Substance Use Topics   Alcohol use: Yes    Comment: rare   Drug use: Not Currently    Types: Marijuana    Home Medications Prior to Admission medications   Medication Sig Start Date End Date Taking? Authorizing Provider  albuterol (VENTOLIN HFA) 108 (90 Base) MCG/ACT inhaler Inhale 2 puffs into the lungs every 4 (four) hours as needed for wheezing or shortness of breath. 06/28/20   [name], PA-C  allopurinol (ZYLOPRIM) 100 MG tablet TAKE 1 TABLET BY MOUTH ONCE DAILY. 11/05/20   Birdie Sons, MD  aspirin 81 MG EC tablet Take 1 tablet (81 mg total) by mouth daily. 09/20/20   Thurnell Lose, MD  atorvastatin (LIPITOR) 80 MG tablet TAKE 1 TABLET BY MOUTH ONCE DAILY AT 6PM Patient taking differently: Take 80 mg by mouth every evening. 07/08/20   [name], PA-C  AURYXIA 1 GM 210 MG(Fe) tablet Take 2 tablets (420 mg total) by mouth 3 (three) times daily with meals. 05/03/20   [name], PA-C  B Complex-C-Folic Acid (RENA-VITE RX PO) Take 1 mg by mouth daily.    [provider]  B Complex-C-Folic Acid (RENA-VITE RX) 1 MG TABS Take 1 mg by mouth daily. 10/15/20   [provider]  buPROPion (WELLBUTRIN SR) 150 MG 12 hr tablet Take 1 tablet (150 mg total) by mouth 2 (two) times daily. 05/03/20   [name], PA-C  carvedilol (COREG) 25 MG tablet Take 1 tablet (25 mg total) by mouth 2 (two) times  daily with a meal. 10/29/20   Bacigalupo, Dionne Bucy, MD  colchicine 0.6 MG tablet Take 1 tablet (0.6 mg total) by mouth once a week for 15 days. 09/29/20 10/14/20  Thurnell Lose, MD  Continuous Blood Gluc Receiver (FREESTYLE LIBRE 14 DAY READER) DEVI To check blood sugar ACHS 09/27/20   [name], PA-C  Continuous Blood Gluc Sensor (FREESTYLE LIBRE 14 DAY SENSOR) MISC To check blood sugar ACHS; change every 14 days 09/27/20   [name], PA-C  cyclobenzaprine (FLEXERIL) 10 MG tablet Take 1 tablet (10 mg total) by mouth 2 (two) times daily as needed for muscle spasms. 10/24/20   Corena Herter, PA-C  diclofenac Sodium (VOLTAREN) 1 % GEL Apply 2 g topically 4 (four) times daily. 10/24/20   Corena Herter, PA-C  dicyclomine (BENTYL) 20 MG tablet Take 1 tablet (20 mg total) by mouth in the morning and at bedtime. 09/27/20   [name], PA-C  docusate sodium (COLACE) 100 MG capsule Take 1 capsule (100 mg total) by mouth 2 (two) times daily as needed for mild constipation. 09/20/17   Gouru, Illene Silver, MD  doxepin (SINEQUAN) 10 MG capsule TAKE 1 CAPSULE(10 MG) BY MOUTH FOUR TIMES DAILY AS NEEDED FOR ITCHING Patient taking differently: Take 10 mg by mouth 2 (two) times daily. 05/03/20   [name], PA-C  fluticasone (FLONASE) 50 MCG/ACT nasal spray Place 2 sprays into both nostrils daily as needed for allergies or rhinitis. 09/28/17   [name], PA-C  fluticasone furoate-vilanterol (BREO ELLIPTA) 200-25 MCG/INH AEPB Inhale 1 puff into the lungs daily. 08/30/20   [name], PA-C  hydrALAZINE (APRESOLINE) 100 MG tablet Take 100 mg by mouth 3 (three) times daily. 10/07/20   [provider]  hydrOXYzine (ATARAX/VISTARIL) 25 MG  tablet Take 1 tablet (25 mg total) by mouth 4 (four) times daily. 12/03/20   Bacigalupo, Dionne Bucy, MD  insulin detemir (LEVEMIR FLEXTOUCH) 100 UNIT/ML FlexPen Inject 60 Units into the skin 2 (two) times daily. 05/24/20   [name], PA-C  insulin glargine (LANTUS) 100 UNIT/ML Solostar Pen 25 iu sq qd and increase by 2 iu for every 3 day average greater than 150 03/04/21   Chrismon, Dennis E, PA-C  insulin lispro (HUMALOG KWIKPEN) 100 UNIT/ML KwikPen Inject 25 Units into the skin with breakfast, with lunch, and with evening meal. Patient taking differently: Inject 7 Units into the skin 2 (two) times daily. 05/14/20   [name], PA-C  lidocaine-prilocaine (EMLA) cream Apply 1 application topically as needed. Patient taking differently: Apply 1 application topically as needed (dialysis days). 03/27/19   Schnier, Dolores Lory, MD  loratadine (CLARITIN) 10 MG tablet TAKE 1 TABLET BY MOUTH ONCE DAILY. Patient taking differently: Take 10 mg by mouth daily as needed for allergies. 07/15/20   [name], PA-C  montelukast (SINGULAIR) 10 MG tablet TAKE ONE TABLET BY MOUTH AT BEDTIME. Patient taking differently: Take 10 mg by mouth at bedtime as needed (allergies). 07/15/20   [name], PA-C  nitroGLYCERIN (NITROSTAT) 0.4 MG SL tablet Place 1 tablet (0.4 mg total) under the tongue every 5 (five) minutes x 3 doses as needed for chest pain. 05/03/20   [name], PA-C  Nutritional Supplements (FEEDING SUPPLEMENT, NEPRO CARB STEADY,) LIQD Take 237 mLs by mouth daily. 02/01/20   Loletha Grayer, MD  nystatin cream (MYCOSTATIN) Apply 1 application topically 2 (two) times daily. Patient taking differently: Apply 1 application topically daily as needed for dry skin. 08/10/19   [name], PA-C  omeprazole (PRILOSEC) 20 MG capsule Take 20 mg by mouth daily. 10/07/20   [provider]  oxyCODONE (ROXICODONE) 5 MG immediate release tablet Take 1 tablet (5 mg total) by mouth every 12 (twelve) hours as needed for severe pain. 08/30/20   [name], PA-C  oxyCODONE-acetaminophen (PERCOCET/ROXICET) 5-325 MG tablet Take 1 tablet by mouth 4 (four) times daily as needed for pain. 10/23/20    [provider]  pantoprazole (PROTONIX) 40 MG tablet Take 1 tablet (40 mg total) by mouth 2 (two) times daily. 09/24/20   Thurnell Lose, MD  pregabalin (LYRICA) 150 MG capsule Take 1 capsule (150 mg total) by mouth 2 (two) times daily. 09/27/20   [name], PA-C  SENNA-PLUS 8.6-50 MG tablet TAKE ONE TABLET BY MOUTH AT BEDTIME. 07/15/20   [name], PA-C  sevelamer carbonate (RENVELA) 800 MG tablet Take 800 mg by mouth 3 (three) times daily. 08/21/20 08/21/21  [provider]  sucralfate (CARAFATE) 1 g tablet Take 1 tablet (1 g total) by mouth 2 (two) times daily for 26 days. 09/24/20 10/20/20  Thurnell Lose, MD  torsemide (DEMADEX) 20 MG tablet TAKE (2) TABLETS BY MOUTH TWICE DAILY. 01/24/21   Virginia Crews, MD  triamcinolone cream (KENALOG) 0.1 % APPLY TO AFFECTED AREA TWICE DAILY Patient taking differently: Apply 1 application topically 2 (two) times daily as needed (rash). 06/17/18   [name], PA-C  ULTICARE SHORT PEN NEEDLES 31G X 8 MM MISC USE THREE TIMES DAILY WITH INSULIN 01/21/21   Jerrol Banana., MD  valACYclovir (VALTREX) 500 MG tablet TAKE (1) TABLET BY MOUTH EVERY OTHER DAY. 01/24/21   Bacigalupo, Dionne Bucy, MD  VICTOZA 18 MG/3ML SOPN  Start with 0.91m daily and increase by 0.669mper week until max dose of 1.8 mg dose achieved. 02/19/21   GiJerrol Banana MD  Vitamin D, Ergocalciferol, (DRISDOL) 1.25 MG (50000 UNIT) CAPS capsule TAKE 1 CAPSULE BY MOUTH ONCE A MONTH 11/04/20   FiBirdie SonsMD  gabapentin (NEURONTIN) 100 MG capsule Take 1 capsule (100 mg total) by mouth 3 (three) times daily. 08/19/17 02/03/19  PaFritzi MandesMD    Allergies    Morphine, Shellfish allergy, and Diazepam  Review of Systems   Review of Systems  Constitutional:  Negative for chills and fever.  HENT:  Negative for ear pain and sore throat.   Eyes:  Negative for pain and visual disturbance.  Respiratory:  Negative for cough and shortness  of breath.   Cardiovascular:  Negative for chest pain and palpitations.  Gastrointestinal:  Negative for abdominal pain and vomiting.  Genitourinary:  Negative for dysuria and hematuria.  Musculoskeletal:  Negative for arthralgias and back pain.  Skin:  Negative for color change and rash.  Neurological:  Negative for seizures and syncope.  All other systems reviewed and are negative. Patient denies all symptoms but is altered so unreliable.   Physical Exam Updated Vital Signs BP (!) 103/46   Pulse 87   Temp (!) 101.8 F (38.8 C) (Oral)   Resp 20   Ht _0  (1.727 m)   Wt 125 kg   SpO2 98%   BMI 41.90 kg/m   Physical Exam Vitals and nursing note reviewed.  Constitutional:      General: She is not in acute distress.    Appearance: She is well-developed.  HENT:     Head: Normocephalic and atraumatic.  Eyes:     Conjunctiva/sclera: Conjunctivae normal.  Cardiovascular:     Rate and Rhythm: Normal rate and regular rhythm.     Heart sounds: No murmur heard. Pulmonary:     Effort: Pulmonary effort is normal. No respiratory distress.     Breath sounds: Normal breath sounds. No decreased breath sounds, wheezing or rhonchi.  Abdominal:     Palpations: Abdomen is soft.     Tenderness: There is abdominal tenderness. There is guarding. There is no rebound.     Comments: Ostomy site c/d/I. Draining non-bloody stool.   Musculoskeletal:     Cervical back: Neck supple.     Right lower leg: No tenderness. Edema present.     Left lower leg: No tenderness. Edema present.  Skin:    General: Skin is warm and dry.  Neurological:     General: No focal deficit present.     Mental Status: She is alert.     Comments: Nonfocal exam but patient is confused on questioning. Oriented only to self. Redirectable.     ED Results / Procedures / Treatments   Labs (all labs ordered are listed, but only abnormal results are displayed) Labs Reviewed  RESP PANEL BY RT-PCR (FLU A&B, COVID) [id number] -  Abnormal; Notable for the following components:      Result Value   SARS Coronavirus 2 by RT PCR POSITIVE (*)    All other components within normal limits  COMPREHENSIVE METABOLIC PANEL - Abnormal; Notable for the following components:   Sodium 134 (*)    Chloride 92 (*)    Glucose, Bld 191 (*)    BUN 49 (*)    Creatinine, Ser 10.60 (*)    Calcium 7.8 (*)    Albumin 2.8 (*)  AST 44 (*)    Alkaline Phosphatase 153 (*)    GFR, Estimated 4 (*)    Anion gap 17 (*)    All other components within normal limits  CBC WITH DIFFERENTIAL/PLATELET - Abnormal; Notable for the following components:   Platelets 106 (*)    All other components within normal limits  I-STAT CHEM 8, ED - Abnormal; Notable for the following components:   Sodium 134 (*)    Chloride 95 (*)    BUN 72 (*)    Creatinine, Ser 11.00 (*)    Glucose, Bld 188 (*)    Calcium, Ion 0.86 (*)    Hemoglobin 18.7 (*)    HCT 55.0 (*)    All other components within normal limits  CULTURE, BLOOD (ROUTINE X 2)  CULTURE, BLOOD (ROUTINE X 2)  LACTIC ACID, PLASMA  LACTIC ACID, PLASMA  I-STAT VENOUS BLOOD GAS, ED  CBG MONITORING, ED    EKG EKG Interpretation  Date/Time:  Sunday April 20 2021 21:01:59 EDT Ventricular Rate:  87 PR Interval:  136 QRS Duration: 88 QT Interval:  406 QTC Calculation: 489 R Axis:   57 Text Interpretation: Sinus rhythm Borderline prolonged QT interval No significant change since last tracing Confirmed by Wandra Arthurs [phone]) on 04/20/2021 9:37:55 PM  Radiology CT ABDOMEN PELVIS WO CONTRAST  Result Date: 04/20/2021 CLINICAL DATA:  Short of breath, lethargy, abdominal distension EXAM: CT ABDOMEN AND PELVIS WITHOUT CONTRAST TECHNIQUE: Multidetector CT imaging of the abdomen and pelvis was performed following the standard protocol without IV contrast. COMPARISON:  01/01/2021 FINDINGS: Lower chest: Hypoventilatory changes at the lung bases. No acute airspace disease or effusion. Hepatobiliary:  Cholecystectomy. Unenhanced imaging of the liver is unremarkable. Pancreas: Unremarkable. No pancreatic ductal dilatation or surrounding inflammatory changes. Spleen: Normal in size without focal abnormality. Adrenals/Urinary Tract: Stable left renal cortical cyst. No urinary tract calculi or obstructive uropathy. Bladder is unremarkable. Adrenals are normal. Stomach/Bowel: No bowel obstruction or ileus. Postsurgical changes are seen from diverting colostomy left mid abdomen. No bowel wall thickening or inflammatory change. Vascular/Lymphatic: Aortic atherosclerosis. No enlarged abdominal or pelvic lymph nodes. Reproductive: Uterus and bilateral adnexa are unremarkable. Other: No free fluid or free gas. Musculoskeletal: No acute or destructive bony lesions. Stable postsurgical changes right inguinal region. Reconstructed images demonstrate no additional findings. IMPRESSION: 1. No acute intra-abdominal or intrapelvic process. 2. Stable postsurgical changes from diverting colostomy. No bowel obstruction or ileus. 3.  Aortic Atherosclerosis (ICD10-I70.0). Electronically Signed   By: Randa Ngo M.D.   On: 04/20/2021 23:07   CT HEAD WO CONTRAST (5MM)  Result Date: 04/20/2021 CLINICAL DATA:  Mental status change, unknown cause EXAM: CT HEAD WITHOUT CONTRAST TECHNIQUE: Contiguous axial images were obtained from the base of the skull through the vertex without intravenous contrast. COMPARISON:  09/26/2015 FINDINGS: Brain: Diffuse cerebral atrophy. No acute intracranial abnormality. Specifically, no hemorrhage, hydrocephalus, mass lesion, acute infarction, or significant intracranial injury. Vascular: No hyperdense vessel or unexpected calcification. Skull: No acute calvarial abnormality. Sinuses/Orbits: No acute findings Other: None IMPRESSION: Cerebral atrophy.  No acute intracranial abnormality. Electronically Signed   By: Rolm Baptise M.D.   On: 04/20/2021 23:04   DG Chest Port 1 View  Result Date:  04/20/2021 CLINICAL DATA:  Short of breath, hypoxic, missed dialysis EXAM: PORTABLE CHEST 1 VIEW COMPARISON:  03/05/2021 FINDINGS: Single frontal view of the chest demonstrates stable enlargement of the cardiac silhouette. Chronic vascular congestion without airspace disease, effusion, or pneumothorax. No acute bony abnormalities. IMPRESSION: 1.  Chronic vascular congestion without overt edema. Electronically Signed   By: Randa Ngo M.D.   On: 04/20/2021 20:44    Procedures Ultrasound ED Peripheral IV (Provider)  Date/Time: 04/21/2021 1:20 AM Performed by: Coralee Pesa, MD Authorized by: Drenda Freeze, MD   Procedure details:    Indications: poor IV access     Skin Prep: isopropyl alcohol     Location:  Right forearm   Angiocath:  20 G   Bedside Ultrasound Guided: Yes     Images: not archived     Patient tolerated procedure without complications: Yes     Dressing applied: Yes     Medications Ordered in ED Medications  dexamethasone (DECADRON) injection 8 mg (has no administration in time range)  remdesivir 200 mg in sodium chloride 0.9% 250 mL IVPB (has no administration in time range)    Followed by  remdesivir 100 mg in sodium chloride 0.9 % 100 mL IVPB (has no administration in time range)  acetaminophen (TYLENOL) tablet 650 mg (650 mg Oral Given 04/20/21 2113)    ED Course  I have reviewed the triage vital signs and the nursing notes.  Pertinent labs & imaging results that were available during my care of the patient were reviewed by me and considered in my medical decision making (see chart for details).    MDM Rules/Calculators/A&P                          Patient is stable and well-appearing on arrival.  Febrile to 101.8 but no tachycardia or hypotension.  Is hypoxic to the 80s on arrival requiring 3 L of oxygen by nasal cannula.  Patient is altered but without any focal neurologic deficits, most consistent with delirium.  Likely secondary to infection with  likely pulmonary source given patient's new oxygen requirement.  Chest x-[place] reveals no cardiopulmonary abnormality but patient is COVID-positive.  Lactate of 1.4, no evidence of sepsis.  BUN and creatinine elevated as expected given ESRD. Did miss dialysis yesterday but no obvious urgent indications for dialysis.  No elevated white blood cell count and hemoglobin is stable.    Patient does have some generalized abdominal tenderness on exam.  CT abdomen pelvis without any evidence of acute intra-abdominal pathology.  CT head also unremarkable.  Will admit for AMS in the setting of COVID and new O2 requirement. Given decadron and remdisivir. Stable on 3 L at time of admit.    Final Clinical Impression(s) / ED Diagnoses Final diagnoses:  Hypoxia  COVID  Disorientation    Rx / DC Orders ED Discharge Orders     None        Coralee Pesa, MD 04/20/21 2317    Coralee Pesa, MD 04/20/21 2318    Coralee Pesa, MD 04/21/21 Areta Haber    Drenda Freeze, MD 04/21/21 2322

## 2021-04-20 NOTE — ED Triage Notes (Signed)
Pt brought in by EMS from home for shortness of breath and lethargy. Pt missed her dialysis treatment yesterday.

## 2021-04-21 ENCOUNTER — Encounter (HOSPITAL_COMMUNITY): Payer: Self-pay | Admitting: Internal Medicine

## 2021-04-21 ENCOUNTER — Inpatient Hospital Stay (HOSPITAL_COMMUNITY): Payer: Medicare Other

## 2021-04-21 DIAGNOSIS — A419 Sepsis, unspecified organism: Secondary | ICD-10-CM | POA: Diagnosis present

## 2021-04-21 HISTORY — DX: Sepsis, unspecified organism: A41.9

## 2021-04-21 LAB — TSH: TSH: 0.715 u[IU]/mL (ref 0.350–4.500)

## 2021-04-21 LAB — GLUCOSE, CAPILLARY
Glucose-Capillary: 458 mg/dL — ABNORMAL HIGH (ref 70–99)
Glucose-Capillary: 474 mg/dL — ABNORMAL HIGH (ref 70–99)
Glucose-Capillary: 528 mg/dL (ref 70–99)

## 2021-04-21 LAB — I-STAT VENOUS BLOOD GAS, ED
Acid-Base Excess: 1 mmol/L (ref 0.0–2.0)
Acid-Base Excess: 2 mmol/L (ref 0.0–2.0)
Bicarbonate: 24.3 mmol/L (ref 20.0–28.0)
Bicarbonate: 25.6 mmol/L (ref 20.0–28.0)
Calcium, Ion: 0.85 mmol/L — CL (ref 1.15–1.40)
Calcium, Ion: 0.86 mmol/L — CL (ref 1.15–1.40)
HCT: 29 % — ABNORMAL LOW (ref 36.0–46.0)
HCT: 30 % — ABNORMAL LOW (ref 36.0–46.0)
Hemoglobin: 10.2 g/dL — ABNORMAL LOW (ref 12.0–15.0)
Hemoglobin: 9.9 g/dL — ABNORMAL LOW (ref 12.0–15.0)
O2 Saturation: 100 %
O2 Saturation: 99 %
Potassium: 5 mmol/L (ref 3.5–5.1)
Potassium: 5.2 mmol/L — ABNORMAL HIGH (ref 3.5–5.1)
Sodium: 131 mmol/L — ABNORMAL LOW (ref 135–145)
Sodium: 131 mmol/L — ABNORMAL LOW (ref 135–145)
TCO2: 25 mmol/L (ref 22–32)
TCO2: 27 mmol/L (ref 22–32)
pCO2, Ven: 32.9 mmHg — ABNORMAL LOW (ref 44.0–60.0)
pCO2, Ven: 33.8 mmHg — ABNORMAL LOW (ref 44.0–60.0)
pH, Ven: 7.476 — ABNORMAL HIGH (ref 7.250–7.430)
pH, Ven: 7.488 — ABNORMAL HIGH (ref 7.250–7.430)
pO2, Ven: 132 mmHg — ABNORMAL HIGH (ref 32.0–45.0)
pO2, Ven: 181 mmHg — ABNORMAL HIGH (ref 32.0–45.0)

## 2021-04-21 LAB — COMPREHENSIVE METABOLIC PANEL
ALT: 30 U/L (ref 0–44)
AST: 41 U/L (ref 15–41)
Albumin: 2.6 g/dL — ABNORMAL LOW (ref 3.5–5.0)
Alkaline Phosphatase: 145 U/L — ABNORMAL HIGH (ref 38–126)
Anion gap: 18 — ABNORMAL HIGH (ref 5–15)
BUN: 55 mg/dL — ABNORMAL HIGH (ref 8–23)
CO2: 22 mmol/L (ref 22–32)
Calcium: 7.7 mg/dL — ABNORMAL LOW (ref 8.9–10.3)
Chloride: 93 mmol/L — ABNORMAL LOW (ref 98–111)
Creatinine, Ser: 11.09 mg/dL — ABNORMAL HIGH (ref 0.44–1.00)
GFR, Estimated: 4 mL/min — ABNORMAL LOW (ref >60)
Glucose, Bld: 407 mg/dL — ABNORMAL HIGH (ref 70–99)
Potassium: 5.2 mmol/L — ABNORMAL HIGH (ref 3.5–5.1)
Sodium: 133 mmol/L — ABNORMAL LOW (ref 135–145)
Total Bilirubin: 1.2 mg/dL (ref 0.3–1.2)
Total Protein: 6.9 g/dL (ref 6.5–8.1)

## 2021-04-21 LAB — CBC WITH DIFFERENTIAL/PLATELET
Abs Immature Granulocytes: 0.07 10*3/uL (ref 0.00–0.07)
Basophils Absolute: 0 10*3/uL (ref 0.0–0.1)
Basophils Relative: 0 %
Eosinophils Absolute: 0 10*3/uL (ref 0.0–0.5)
Eosinophils Relative: 0 %
HCT: 30.5 % — ABNORMAL LOW (ref 36.0–46.0)
Hemoglobin: 9.5 g/dL — ABNORMAL LOW (ref 12.0–15.0)
Immature Granulocytes: 2 %
Lymphocytes Relative: 9 %
Lymphs Abs: 0.4 10*3/uL — ABNORMAL LOW (ref 0.7–4.0)
MCH: 31.1 pg (ref 26.0–34.0)
MCHC: 31.1 g/dL (ref 30.0–36.0)
MCV: 100 fL (ref 80.0–100.0)
Monocytes Absolute: 0.2 10*3/uL (ref 0.1–1.0)
Monocytes Relative: 4 %
Neutro Abs: 4.1 10*3/uL (ref 1.7–7.7)
Neutrophils Relative %: 85 %
Platelets: 127 10*3/uL — ABNORMAL LOW (ref 150–400)
RBC: 3.05 MIL/uL — ABNORMAL LOW (ref 3.87–5.11)
RDW: 14 % (ref 11.5–15.5)
WBC: 4.7 10*3/uL (ref 4.0–10.5)
nRBC: 0 % (ref 0.0–0.2)

## 2021-04-21 LAB — FIBRINOGEN: Fibrinogen: 604 mg/dL — ABNORMAL HIGH (ref 210–475)

## 2021-04-21 LAB — CBG MONITORING, ED
Glucose-Capillary: 461 mg/dL — ABNORMAL HIGH (ref 70–99)
Glucose-Capillary: 421 mg/dL — ABNORMAL HIGH (ref 70–99)

## 2021-04-21 LAB — I-STAT BETA HCG BLOOD, ED (MC, WL, AP ONLY): I-stat hCG, quantitative: <5 m[IU]/mL (ref <5)

## 2021-04-21 LAB — D-DIMER, QUANTITATIVE: D-Dimer, Quant: 0.65 ug/mL-FEU — ABNORMAL HIGH (ref 0.00–0.50)

## 2021-04-21 LAB — FERRITIN: Ferritin: 1382 ng/mL — ABNORMAL HIGH (ref 11–307)

## 2021-04-21 LAB — C-REACTIVE PROTEIN: CRP: 8.1 mg/dL — ABNORMAL HIGH (ref <1.0)

## 2021-04-21 LAB — PROCALCITONIN: Procalcitonin: 1.17 ng/mL

## 2021-04-21 LAB — BRAIN NATRIURETIC PEPTIDE: B Natriuretic Peptide: 107.2 pg/mL — ABNORMAL HIGH (ref 0.0–100.0)

## 2021-04-21 LAB — PHOSPHORUS: Phosphorus: 8.9 mg/dL — ABNORMAL HIGH (ref 2.5–4.6)

## 2021-04-21 LAB — LACTATE DEHYDROGENASE: LDH: 309 U/L — ABNORMAL HIGH (ref 98–192)

## 2021-04-21 LAB — HIV ANTIBODY (ROUTINE TESTING W REFLEX): HIV Screen 4th Generation wRfx: NONREACTIVE

## 2021-04-21 LAB — HEMOGLOBIN A1C
Hgb A1c MFr Bld: 12.8 % — ABNORMAL HIGH (ref 4.8–5.6)
Mean Plasma Glucose: 320.66 mg/dL

## 2021-04-21 LAB — MAGNESIUM: Magnesium: 2 mg/dL (ref 1.7–2.4)

## 2021-04-21 LAB — HEPATITIS B SURFACE ANTIGEN: Hepatitis B Surface Ag: NONREACTIVE

## 2021-04-21 LAB — HEPATITIS B SURFACE ANTIBODY,QUALITATIVE: Hep B S Ab: REACTIVE — AB

## 2021-04-21 MED ORDER — INSULIN ASPART 100 UNIT/ML IJ SOLN
0.0000 [IU] | Freq: Three times a day (TID) | INTRAMUSCULAR | Status: DC
  Administered 2021-04-21: 17 [IU] via SUBCUTANEOUS
  Administered 2021-04-21: 20 [IU] via SUBCUTANEOUS
  Administered 2021-04-22: 4 [IU] via SUBCUTANEOUS
  Administered 2021-04-22: 20 [IU] via SUBCUTANEOUS
  Administered 2021-04-22: 11 [IU] via SUBCUTANEOUS
  Administered 2021-04-23: 3 [IU] via SUBCUTANEOUS

## 2021-04-21 MED ORDER — ASPIRIN EC 81 MG PO TBEC
81.0000 mg | DELAYED_RELEASE_TABLET | Freq: Every day | ORAL | Status: DC
  Administered 2021-04-21 – 2021-04-23 (×3): 81 mg via ORAL
  Filled 2021-04-21 (×3): qty 1

## 2021-04-21 MED ORDER — INSULIN DETEMIR 100 UNIT/ML ~~LOC~~ SOLN
25.0000 [IU] | Freq: Two times a day (BID) | SUBCUTANEOUS | Status: DC
  Administered 2021-04-21 – 2021-04-22 (×3): 25 [IU] via SUBCUTANEOUS
  Filled 2021-04-21 (×7): qty 0.25

## 2021-04-21 MED ORDER — DEXAMETHASONE SODIUM PHOSPHATE 10 MG/ML IJ SOLN
6.0000 mg | INTRAMUSCULAR | Status: DC
Start: 2021-04-21 — End: 2021-04-22
  Administered 2021-04-21 – 2021-04-22 (×2): 6 mg via INTRAVENOUS
  Filled 2021-04-21 (×2): qty 1

## 2021-04-21 MED ORDER — PENTAFLUOROPROP-TETRAFLUOROETH EX AERO: 1.0000 "application " | INHALATION_SPRAY | CUTANEOUS | Status: DC | PRN

## 2021-04-21 MED ORDER — AMLODIPINE BESYLATE 10 MG PO TABS
10.0000 mg | ORAL_TABLET | Freq: Every day | ORAL | Status: DC
  Administered 2021-04-21 – 2021-04-23 (×3): 10 mg via ORAL
  Filled 2021-04-21 (×2): qty 1
  Filled 2021-04-21: qty 2

## 2021-04-21 MED ORDER — SODIUM CHLORIDE 0.9 % IV SOLN: 100.0000 mL | INTRAVENOUS | Status: DC | PRN

## 2021-04-21 MED ORDER — INSULIN ASPART 100 UNIT/ML IJ SOLN
3.0000 [IU] | Freq: Three times a day (TID) | INTRAMUSCULAR | Status: DC
  Administered 2021-04-21 – 2021-04-22 (×3): 3 [IU] via SUBCUTANEOUS

## 2021-04-21 MED ORDER — HYDRALAZINE HCL 20 MG/ML IJ SOLN: 10.0000 mg | Freq: Four times a day (QID) | INTRAMUSCULAR | Status: DC | PRN

## 2021-04-21 MED ORDER — LIDOCAINE HCL (PF) 1 % IJ SOLN
5.0000 mL | INTRAMUSCULAR | Status: DC | PRN
  Filled 2021-04-21: qty 5

## 2021-04-21 MED ORDER — INFLUENZA VAC SPLIT QUAD 0.5 ML IM SUSY
0.5000 mL | PREFILLED_SYRINGE | INTRAMUSCULAR | Status: DC
  Filled 2021-04-21: qty 0.5

## 2021-04-21 MED ORDER — INSULIN ASPART 100 UNIT/ML IJ SOLN
15.0000 [IU] | Freq: Once | INTRAMUSCULAR | Status: AC
  Administered 2021-04-21: 15 [IU] via SUBCUTANEOUS

## 2021-04-21 MED ORDER — LIDOCAINE-PRILOCAINE 2.5-2.5 % EX CREA
1.0000 "application " | TOPICAL_CREAM | CUTANEOUS | Status: DC | PRN
  Filled 2021-04-21: qty 5

## 2021-04-21 MED ORDER — ALTEPLASE 2 MG IJ SOLR: 2.0000 mg | Freq: Once | INTRAMUSCULAR | Status: DC | PRN

## 2021-04-21 MED ORDER — INSULIN DETEMIR 100 UNIT/ML ~~LOC~~ SOLN
20.0000 [IU] | Freq: Two times a day (BID) | SUBCUTANEOUS | Status: DC
  Administered 2021-04-21: 20 [IU] via SUBCUTANEOUS
  Filled 2021-04-21 (×3): qty 0.2

## 2021-04-21 MED ORDER — INSULIN ASPART 100 UNIT/ML IJ SOLN
0.0000 [IU] | Freq: Every day | INTRAMUSCULAR | Status: DC
  Administered 2021-04-22: 2 [IU] via SUBCUTANEOUS

## 2021-04-21 MED ORDER — SEVELAMER CARBONATE 800 MG PO TABS
800.0000 mg | ORAL_TABLET | Freq: Three times a day (TID) | ORAL | Status: DC
  Administered 2021-04-21 – 2021-04-23 (×8): 800 mg via ORAL
  Filled 2021-04-21 (×9): qty 1

## 2021-04-21 MED ORDER — FERRIC CITRATE 1 GM 210 MG(FE) PO TABS: 420.0000 mg | ORAL_TABLET | Freq: Three times a day (TID) | ORAL | Status: DC

## 2021-04-21 MED ORDER — HEPARIN SODIUM (PORCINE) 5000 UNIT/ML IJ SOLN
5000.0000 [IU] | Freq: Three times a day (TID) | INTRAMUSCULAR | Status: DC
  Administered 2021-04-21 – 2021-04-23 (×7): 5000 [IU] via SUBCUTANEOUS
  Filled 2021-04-21 (×7): qty 1

## 2021-04-21 MED ORDER — HEPARIN SODIUM (PORCINE) 1000 UNIT/ML DIALYSIS: 1000.0000 [IU] | INTRAMUSCULAR | Status: DC | PRN

## 2021-04-21 MED ORDER — ALBUTEROL SULFATE HFA 108 (90 BASE) MCG/ACT IN AERS
1.0000 | INHALATION_SPRAY | RESPIRATORY_TRACT | Status: DC | PRN
  Filled 2021-04-21: qty 6.7

## 2021-04-21 MED ORDER — ATORVASTATIN CALCIUM 80 MG PO TABS
80.0000 mg | ORAL_TABLET | Freq: Every evening | ORAL | Status: DC
  Administered 2021-04-21 – 2021-04-22 (×2): 80 mg via ORAL
  Filled 2021-04-21 (×2): qty 1
  Filled 2021-04-21: qty 2

## 2021-04-21 NOTE — Consult Note (Addendum)
[facility] Renal Consultation Note    Indication for Consultation:  Management of ESRD/hemodialysis; anemia, hypertension/volume and secondary hyperparathyroidism  HPI: Heather KNEE is a 65 y.o. female with ESRD on HD, DMT2, HTN, CHF, CAD s/p stents, hx of necrotizing fascitis s/p diverting colostomy. She is admitted with COVID-19 infection. +PCR and febrile on arrival with temp 101.8. CXR shows new streaky bibisalr opacity. No acute findings on CT Abd.  Labs Na 133, K 5.2, BUN 55 Cr 11.09, Phos 8.9, Alb 2.6, WBC 4.7 Hgb 9.5. Remdesivir and dexamethasone started in the ED.   Seen and examined at bedside. O2 sats 98% on 2L [place]. She reports 1 week of cough with dyspnea. Also complains of body aches -neck and back hurting. She denies any sick contacts. Missed dialysis Saturday d/t not feeling well.   Dialysis TTS at [facility].  Last dialysis Thursday 10/6. Dialysis via LUE AVF.   Past Medical History:  Diagnosis Date   Anemia    Aortic stenosis    Echo 8/18: mean 13, peak 28, LVOT/AV mean velocity 0.51   Arthritis    Asthma    As a child    Bronchitis    CAD (coronary artery disease)    a. 09/2016: 50% Ost 1st Mrg stenosis, 50% 2nd Mrg stenosis, 20% Mid-Cx, 95% Prox LAD, 40% mid-LAD, and 10% dist-LAD stenosis. Staged PCI with DES to Prox-LAD.    Chronic combined systolic and diastolic CHF (congestive heart failure) ([place]) 2011   echo 2/18: EF 55-60, normal wall motion, grade 2 diastolic dysfunction, trivial AI // echo 3/18: Septal and apical HK, EF 45-50, normal wall motion, trivial AI, mild LAE, PASP 38 // echo 8/18: EF 60-65, normal wall motion, grade 1 diastolic dysfunction, calcified aortic valve leaflets, mild aortic stenosis (mean 13, peak 28, LVOT/AV mean velocity 0.51), mild AI, moderate MAC, mild LAE, trivial TR    Chronic kidney disease    STAGE 4   Chronic kidney disease on chronic dialysis (HCC)    t, th, sat   Complication of anesthesia     Depression    Diabetes mellitus Dx 1989   Elevated lipids    GERD (gastroesophageal reflux disease)    Gout    Heart murmur    asymptomatic   Hepatitis C Dx 2013   Hypertension Dx 1989   Infected surgical wound    Lt arm   Myocardial infarction ([place]) 07/2015   Obesity    Pancreatitis 2013   Pneumonia    Refusal of blood transfusions as patient is Jehovah's Witness    pt states she is not [place] witness and does not refuse blood products   Tendinitis    Tremors of nervous system    LEFT HAND   Ulcer 2010   Past Surgical History:  Procedure Laterality Date   A/V FISTULAGRAM Left 04/11/2019   Procedure: A/V FISTULAGRAM;  Surgeon: Katha Cabal, MD;  Location: [place] [facility];  Service: Cardiovascular;  Laterality: Left;   A/V FISTULAGRAM Left 06/02/2019   Procedure: A/V FISTULAGRAM;  Surgeon: Katha Cabal, MD;  Location: [place] [facility];  Service: Cardiovascular;  Laterality: Left;   APPLICATION OF WOUND VAC Left 08/14/2017   Procedure: APPLICATION OF WOUND VAC Exchange;  Surgeon: Robert Bellow, MD;  Location: [facility] ORS;  Service: General;  Laterality: Left;   APPLICATION OF WOUND VAC Left 12/21/2018   Procedure: APPLICATION OF WOUND VAC;  Surgeon: Katha Cabal, MD;  Location: [facility]  ORS;  Service: Vascular;  Laterality: Left;   AV FISTULA PLACEMENT Left 08/19/2018   Procedure: ARTERIOVENOUS (AV) FISTULA CREATION ( BRACHIOBASILIC );  Surgeon: Katha Cabal, MD;  Location: [facility] ORS;  Service: Vascular;  Laterality: Left;   BASCILIC VEIN TRANSPOSITION Left 11/18/2018   Procedure: BASCILIC VEIN TRANSPOSITION;  Surgeon: Katha Cabal, MD;  Location: [facility];  Service: Vascular;  Laterality: Left;   BIOPSY  09/20/2020   Procedure: BIOPSY;  Surgeon: Carol Ada, MD;  Location: [place];  Service: Endoscopy;;   CHOLECYSTECTOMY     COLONOSCOPY WITH PROPOFOL N/A 02/03/2018   Procedure: COLONOSCOPY WITH PROPOFOL;  Surgeon: Lin Landsman, MD;   Location: [facility];  Service: Gastroenterology;  Laterality: N/A;   CORONARY ANGIOPLASTY  07/2015   STENT   CORONARY STENT INTERVENTION N/A 09/18/2016   Procedure: Coronary Stent Intervention;  Surgeon: Troy Sine, MD;  Location: [place] [facility];  Service: Cardiovascular;  Laterality: N/A;   DIALYSIS/PERMA CATHETER INSERTION N/A 05/10/2018   Procedure: DIALYSIS/PERMA CATHETER INSERTION;  Surgeon: Katha Cabal, MD;  Location: [place] [facility];  Service: Cardiovascular;  Laterality: N/A;   DRESSING CHANGE UNDER ANESTHESIA Left 08/15/2017   Procedure: exploration of wound for bleeding;  Surgeon: Robert Bellow, MD;  Location: [facility];  Service: General;  Laterality: Left;   ESOPHAGOGASTRODUODENOSCOPY (EGD) WITH PROPOFOL N/A 02/03/2018   Procedure: ESOPHAGOGASTRODUODENOSCOPY (EGD) WITH PROPOFOL;  Surgeon: Lin Landsman, MD;  Location: [facility] ENDOSCOPY;  Service: Gastroenterology;  Laterality: N/A;   ESOPHAGOGASTRODUODENOSCOPY (EGD) WITH PROPOFOL N/A 09/20/2020   Procedure: ESOPHAGOGASTRODUODENOSCOPY (EGD) WITH PROPOFOL;  Surgeon: Carol Ada, MD;  Location: [place];  Service: Endoscopy;  Laterality: N/A;   EYE SURGERY  11/17/2018   INCISION AND DRAINAGE ABSCESS Left 08/12/2017   Procedure: INCISION AND DRAINAGE ABSCESS;  Surgeon: Robert Bellow, MD;  Location: [facility];  Service: General;  Laterality: Left;   KNEE ARTHROSCOPY     LEFT HEART CATH N/A 09/18/2016   Procedure: Left Heart Cath;  Surgeon: Troy Sine, MD;  Location: [place] [facility];  Service: Cardiovascular;  Laterality: N/A;   LEFT HEART CATH AND CORONARY ANGIOGRAPHY N/A 09/16/2016   Procedure: Left Heart Cath and Coronary Angiography;  Surgeon: Burnell Blanks, MD;  Location: [place] [facility];  Service: Cardiovascular;  Laterality: N/A;   LEFT HEART CATH AND CORONARY ANGIOGRAPHY N/A 04/29/2017   Procedure: LEFT HEART CATH AND CORONARY ANGIOGRAPHY;  Surgeon: Nelva Bush, MD;  Location:  [place] [facility];  Service: Cardiovascular;  Laterality: N/A;   LOWER EXTREMITY ANGIOGRAPHY Right 03/08/2018   Procedure: LOWER EXTREMITY ANGIOGRAPHY;  Surgeon: Katha Cabal, MD;  Location: [place] [facility];  Service: Cardiovascular;  Laterality: Right;   TUBAL LIGATION     TUBAL LIGATION     UPPER EXTREMITY ANGIOGRAPHY Right 09/19/2019   Procedure: UPPER EXTREMITY ANGIOGRAPHY;  Surgeon: Katha Cabal, MD;  Location: [place] [facility];  Service: Cardiovascular;  Laterality: Right;   WOUND DEBRIDEMENT Left 12/21/2018   Procedure: DEBRIDEMENT WOUND;  Surgeon: Katha Cabal, MD;  Location: [facility];  Service: Vascular;  Laterality: Left;   WOUND DEBRIDEMENT Left 12/30/2018   Procedure: DEBRIDEMENT WOUND WITH VAC PLACEMENT (LEFT UPPER EXTREMITY);  Surgeon: Katha Cabal, MD;  Location: [facility];  Service: Vascular;  Laterality: Left;   Family History  Problem Relation Age of Onset   Colon cancer Mother    Heart attack Other    Heart attack Maternal Grandmother  Hypertension Sister    Hypertension Brother    Diabetes Paternal Grandmother    Breast cancer Neg Hx    Social History:  reports that she quit smoking about 40 years ago. Her smoking use included cigarettes. She has a 1.50 pack-year smoking history. She has never used smokeless tobacco. She reports current alcohol use. She reports that she does not currently use drugs after having used the following drugs: Marijuana. Allergies  Allergen Reactions   Morphine Itching    Tolerated hydromorphone on 07/2020   Shellfish Allergy Anaphylaxis and Swelling   Diazepam Other (See Comments)    "felt like out of body experience"   Prior to Admission medications   Medication Sig Start Date End Date Taking? Authorizing Provider  albuterol (VENTOLIN HFA) 108 (90 Base) MCG/ACT inhaler Inhale 2 puffs into the lungs every 4 (four) hours as needed for wheezing or shortness of breath. 06/28/20   [name], PA-C   allopurinol (ZYLOPRIM) 100 MG tablet TAKE 1 TABLET BY MOUTH ONCE DAILY. 11/05/20   Birdie Sons, MD  aspirin 81 MG EC tablet Take 1 tablet (81 mg total) by mouth daily. 09/20/20   Thurnell Lose, MD  atorvastatin (LIPITOR) 80 MG tablet TAKE 1 TABLET BY MOUTH ONCE DAILY AT 6PM Patient taking differently: Take 80 mg by mouth every evening. 07/08/20   [name], PA-C  AURYXIA 1 GM 210 MG(Fe) tablet Take 2 tablets (420 mg total) by mouth 3 (three) times daily with meals. 05/03/20   [name], PA-C  B Complex-C-Folic Acid (RENA-VITE RX PO) Take 1 mg by mouth daily.    [provider]  B Complex-C-Folic Acid (RENA-VITE RX) 1 MG TABS Take 1 mg by mouth daily. 10/15/20   [provider]  buPROPion (WELLBUTRIN SR) 150 MG 12 hr tablet Take 1 tablet (150 mg total) by mouth 2 (two) times daily. 05/03/20   [name], PA-C  carvedilol (COREG) 25 MG tablet Take 1 tablet (25 mg total) by mouth 2 (two) times daily with a meal. 10/29/20   Bacigalupo, Dionne Bucy, MD  colchicine 0.6 MG tablet Take 1 tablet (0.6 mg total) by mouth once a week for 15 days. 09/29/20 10/14/20  Thurnell Lose, MD  Continuous Blood Gluc Receiver (FREESTYLE LIBRE 14 DAY READER) DEVI To check blood sugar ACHS 09/27/20   [name], PA-C  Continuous Blood Gluc Sensor (FREESTYLE LIBRE 14 DAY SENSOR) MISC To check blood sugar ACHS; change every 14 days 09/27/20   [name], PA-C  cyclobenzaprine (FLEXERIL) 10 MG tablet Take 1 tablet (10 mg total) by mouth 2 (two) times daily as needed for muscle spasms. 10/24/20   Corena Herter, PA-C  diclofenac Sodium (VOLTAREN) 1 % GEL Apply 2 g topically 4 (four) times daily. 10/24/20   Corena Herter, PA-C  dicyclomine (BENTYL) 20 MG tablet Take 1 tablet (20 mg total) by mouth in the morning and at bedtime. 09/27/20   [name], PA-C  docusate sodium (COLACE) 100 MG capsule Take 1 capsule (100 mg total) by mouth 2 (two) times  daily as needed for mild constipation. 09/20/17   Gouru, Illene Silver, MD  doxepin (SINEQUAN) 10 MG capsule TAKE 1 CAPSULE(10 MG) BY MOUTH FOUR TIMES DAILY AS NEEDED FOR ITCHING Patient taking differently: Take 10 mg by mouth 2 (two) times daily. 05/03/20   [name], PA-C  fluticasone (FLONASE) 50 MCG/ACT nasal spray Place 2 sprays into both nostrils daily as  needed for allergies or rhinitis. 09/28/17   [name], PA-C  fluticasone furoate-vilanterol (BREO ELLIPTA) 200-25 MCG/INH AEPB Inhale 1 puff into the lungs daily. 08/30/20   [name], PA-C  hydrALAZINE (APRESOLINE) 100 MG tablet Take 100 mg by mouth 3 (three) times daily. 10/07/20   [provider]  hydrOXYzine (ATARAX/VISTARIL) 25 MG tablet Take 1 tablet (25 mg total) by mouth 4 (four) times daily. 12/03/20   Bacigalupo, Dionne Bucy, MD  insulin detemir (LEVEMIR FLEXTOUCH) 100 UNIT/ML FlexPen Inject 60 Units into the skin 2 (two) times daily. 05/24/20   [name], PA-C  insulin glargine (LANTUS) 100 UNIT/ML Solostar Pen 25 iu sq qd and increase by 2 iu for every 3 day average greater than 150 03/04/21   Chrismon, Dennis E, PA-C  insulin lispro (HUMALOG KWIKPEN) 100 UNIT/ML KwikPen Inject 25 Units into the skin with breakfast, with lunch, and with evening meal. Patient taking differently: Inject 7 Units into the skin 2 (two) times daily. 05/14/20   [name], PA-C  lidocaine-prilocaine (EMLA) cream Apply 1 application topically as needed. Patient taking differently: Apply 1 application topically as needed (dialysis days). 03/27/19   Schnier, Dolores Lory, MD  loratadine (CLARITIN) 10 MG tablet TAKE 1 TABLET BY MOUTH ONCE DAILY. Patient taking differently: Take 10 mg by mouth daily as needed for allergies. 07/15/20   [name], PA-C  montelukast (SINGULAIR) 10 MG tablet TAKE ONE TABLET BY MOUTH AT BEDTIME. Patient taking differently: Take 10 mg by mouth at bedtime as needed (allergies).  07/15/20   [name], PA-C  nitroGLYCERIN (NITROSTAT) 0.4 MG SL tablet Place 1 tablet (0.4 mg total) under the tongue every 5 (five) minutes x 3 doses as needed for chest pain. 05/03/20   [name], PA-C  Nutritional Supplements (FEEDING SUPPLEMENT, NEPRO CARB STEADY,) LIQD Take 237 mLs by mouth daily. 02/01/20   Loletha Grayer, MD  nystatin cream (MYCOSTATIN) Apply 1 application topically 2 (two) times daily. Patient taking differently: Apply 1 application topically daily as needed for dry skin. 08/10/19   [name], PA-C  omeprazole (PRILOSEC) 20 MG capsule Take 20 mg by mouth daily. 10/07/20   [provider]  oxyCODONE (ROXICODONE) 5 MG immediate release tablet Take 1 tablet (5 mg total) by mouth every 12 (twelve) hours as needed for severe pain. 08/30/20   [name], PA-C  oxyCODONE-acetaminophen (PERCOCET/ROXICET) 5-325 MG tablet Take 1 tablet by mouth 4 (four) times daily as needed for pain. 10/23/20   [provider]  pantoprazole (PROTONIX) 40 MG tablet Take 1 tablet (40 mg total) by mouth 2 (two) times daily. 09/24/20   Thurnell Lose, MD  pregabalin (LYRICA) 150 MG capsule Take 1 capsule (150 mg total) by mouth 2 (two) times daily. 09/27/20   [name], PA-C  SENNA-PLUS 8.6-50 MG tablet TAKE ONE TABLET BY MOUTH AT BEDTIME. 07/15/20   [name], PA-C  sevelamer carbonate (RENVELA) 800 MG tablet Take 800 mg by mouth 3 (three) times daily. 08/21/20 08/21/21  [provider]  sucralfate (CARAFATE) 1 g tablet Take 1 tablet (1 g total) by mouth 2 (two) times daily for 26 days. 09/24/20 10/20/20  Thurnell Lose, MD  torsemide (DEMADEX) 20 MG tablet TAKE (2) TABLETS BY MOUTH TWICE DAILY. 01/24/21   Virginia Crews, MD  triamcinolone cream (KENALOG) 0.1 % APPLY TO AFFECTED AREA TWICE DAILY Patient taking differently: Apply 1 application topically 2 (two) times daily as  needed (rash). 06/17/18   Burnette, Clearnce Sorrel, PA-C  ULTICARE SHORT PEN NEEDLES 31G X 8 MM MISC USE THREE TIMES DAILY WITH INSULIN 01/21/21   Jerrol Banana., MD  valACYclovir (VALTREX) 500 MG tablet TAKE (1) TABLET BY MOUTH EVERY OTHER DAY. 01/24/21   Bacigalupo, Dionne Bucy, MD  VICTOZA 18 MG/3ML SOPN Start with 0.51m daily and increase by 0.685mper week until max dose of 1.8 mg dose achieved. 02/19/21   GiJerrol Banana MD  Vitamin D, Ergocalciferol, (DRISDOL) 1.25 MG (50000 UNIT) CAPS capsule TAKE 1 CAPSULE BY MOUTH ONCE A MONTH 11/04/20   FiBirdie SonsMD  gabapentin (NEURONTIN) 100 MG capsule Take 1 capsule (100 mg total) by mouth 3 (three) times daily. 08/19/17 02/03/19  PaFritzi MandesMD   Current Facility-Administered Medications  Medication Dose Route Frequency Provider Last Rate Last Admin   acetaminophen (TYLENOL) tablet 650 mg  650 mg Oral Q6H PRN Howerter, Justin B, DO       Or   acetaminophen (TYLENOL) suppository 650 mg  650 mg Rectal Q6H PRN Howerter, Justin B, DO       albuterol (VENTOLIN HFA) 108 (90 Base) MCG/ACT inhaler 1-2 puff  1-2 puff Inhalation Q4H PRN Howerter, Justin B, DO       aspirin EC tablet 81 mg  81 mg Oral Daily Howerter, Justin B, DO       atorvastatin (LIPITOR) tablet 80 mg  80 mg Oral QPM Howerter, Justin B, DO       dexamethasone (DECADRON) injection 6 mg  6 mg Intravenous Q24H Howerter, Justin B, DO       insulin aspart (novoLOG) injection 0-6 Units  0-6 Units Subcutaneous TID WC Howerter, Justin B, DO       insulin detemir (LEVEMIR) injection 20 Units  20 Units Subcutaneous BID Howerter, Justin B, DO   20 Units at 04/21/21 0407   remdesivir 100 mg in sodium chloride 0.9 % 100 mL IVPB  100 mg Intravenous Daily Howerter, Justin B, DO       sevelamer carbonate (RENVELA) tablet 800 mg  800 mg Oral TID WC Howerter, Justin B, DO       Current Outpatient Medications  Medication Sig Dispense Refill   albuterol (VENTOLIN HFA) 108 (90 Base) MCG/ACT inhaler Inhale 2 puffs into the lungs every 4  (four) hours as needed for wheezing or shortness of breath. 18 g 5   allopurinol (ZYLOPRIM) 100 MG tablet TAKE 1 TABLET BY MOUTH ONCE DAILY. 28 tablet 11   aspirin 81 MG EC tablet Take 1 tablet (81 mg total) by mouth daily. 30 tablet 12   atorvastatin (LIPITOR) 80 MG tablet TAKE 1 TABLET BY MOUTH ONCE DAILY AT 6PM (Patient taking differently: Take 80 mg by mouth every evening.) 90 tablet 3   AURYXIA 1 GM 210 MG(Fe) tablet Take 2 tablets (420 mg total) by mouth 3 (three) times daily with meals. 540 tablet 3   B Complex-C-Folic Acid (RENA-VITE RX PO) Take 1 mg by mouth daily.     B Complex-C-Folic Acid (RENA-VITE RX) 1 MG TABS Take 1 mg by mouth daily.     buPROPion (WELLBUTRIN SR) 150 MG 12 hr tablet Take 1 tablet (150 mg total) by mouth 2 (two) times daily. 180 tablet 3   carvedilol (COREG) 25 MG tablet Take 1 tablet (25 mg total) by mouth 2 (two) times daily with a meal. 180 tablet 1   colchicine 0.6 MG tablet Take 1  tablet (0.6 mg total) by mouth once a week for 15 days. 2 tablet 0   Continuous Blood Gluc Receiver (FREESTYLE LIBRE 14 DAY READER) DEVI To check blood sugar ACHS 1 each 0   Continuous Blood Gluc Sensor (FREESTYLE LIBRE 14 DAY SENSOR) MISC To check blood sugar ACHS; change every 14 days 6 each 11   cyclobenzaprine (FLEXERIL) 10 MG tablet Take 1 tablet (10 mg total) by mouth 2 (two) times daily as needed for muscle spasms. 20 tablet 0   diclofenac Sodium (VOLTAREN) 1 % GEL Apply 2 g topically 4 (four) times daily. 100 g 0   dicyclomine (BENTYL) 20 MG tablet Take 1 tablet (20 mg total) by mouth in the morning and at bedtime. 180 tablet 1   docusate sodium (COLACE) 100 MG capsule Take 1 capsule (100 mg total) by mouth 2 (two) times daily as needed for mild constipation. 30 capsule 0   doxepin (SINEQUAN) 10 MG capsule TAKE 1 CAPSULE(10 MG) BY MOUTH FOUR TIMES DAILY AS NEEDED FOR ITCHING (Patient taking differently: Take 10 mg by mouth 2 (two) times daily.) 360 capsule 3   fluticasone  (FLONASE) 50 MCG/ACT nasal spray Place 2 sprays into both nostrils daily as needed for allergies or rhinitis. 48 g 1   fluticasone furoate-vilanterol (BREO ELLIPTA) 200-25 MCG/INH AEPB Inhale 1 puff into the lungs daily. 60 each 0   hydrALAZINE (APRESOLINE) 100 MG tablet Take 100 mg by mouth 3 (three) times daily.     hydrOXYzine (ATARAX/VISTARIL) 25 MG tablet Take 1 tablet (25 mg total) by mouth 4 (four) times daily. 360 tablet 1   insulin detemir (LEVEMIR FLEXTOUCH) 100 UNIT/ML FlexPen Inject 60 Units into the skin 2 (two) times daily. 15 mL 3   insulin glargine (LANTUS) 100 UNIT/ML Solostar Pen 25 iu sq qd and increase by 2 iu for every 3 day average greater than 150 15 mL 11   insulin lispro (HUMALOG KWIKPEN) 100 UNIT/ML KwikPen Inject 25 Units into the skin with breakfast, with lunch, and with evening meal. (Patient taking differently: Inject 7 Units into the skin 2 (two) times daily.) 15 mL 11   lidocaine-prilocaine (EMLA) cream Apply 1 application topically as needed. (Patient taking differently: Apply 1 application topically as needed (dialysis days).) 30 g 11   loratadine (CLARITIN) 10 MG tablet TAKE 1 TABLET BY MOUTH ONCE DAILY. (Patient taking differently: Take 10 mg by mouth daily as needed for allergies.) 90 tablet 3   montelukast (SINGULAIR) 10 MG tablet TAKE ONE TABLET BY MOUTH AT BEDTIME. (Patient taking differently: Take 10 mg by mouth at bedtime as needed (allergies).) 90 tablet 3   nitroGLYCERIN (NITROSTAT) 0.4 MG SL tablet Place 1 tablet (0.4 mg total) under the tongue every 5 (five) minutes x 3 doses as needed for chest pain. 25 tablet 12   Nutritional Supplements (FEEDING SUPPLEMENT, NEPRO CARB STEADY,) LIQD Take 237 mLs by mouth daily. 7110 mL 0   nystatin cream (MYCOSTATIN) Apply 1 application topically 2 (two) times daily. (Patient taking differently: Apply 1 application topically daily as needed for dry skin.) 30 g 0   omeprazole (PRILOSEC) 20 MG capsule Take 20 mg by mouth  daily.     oxyCODONE (ROXICODONE) 5 MG immediate release tablet Take 1 tablet (5 mg total) by mouth every 12 (twelve) hours as needed for severe pain. 60 tablet 0   oxyCODONE-acetaminophen (PERCOCET/ROXICET) 5-325 MG tablet Take 1 tablet by mouth 4 (four) times daily as needed for pain.  pantoprazole (PROTONIX) 40 MG tablet Take 1 tablet (40 mg total) by mouth 2 (two) times daily. 60 tablet 2   pregabalin (LYRICA) 150 MG capsule Take 1 capsule (150 mg total) by mouth 2 (two) times daily. 180 capsule 1   SENNA-PLUS 8.6-50 MG tablet TAKE ONE TABLET BY MOUTH AT BEDTIME. 90 tablet 3   sevelamer carbonate (RENVELA) 800 MG tablet Take 800 mg by mouth 3 (three) times daily.     sucralfate (CARAFATE) 1 g tablet Take 1 tablet (1 g total) by mouth 2 (two) times daily for 26 days. 52 tablet 0   torsemide (DEMADEX) 20 MG tablet TAKE (2) TABLETS BY MOUTH TWICE DAILY. 360 tablet 1   triamcinolone cream (KENALOG) 0.1 % APPLY TO AFFECTED AREA TWICE DAILY (Patient taking differently: Apply 1 application topically 2 (two) times daily as needed (rash).) 80 g 0   ULTICARE SHORT PEN NEEDLES 31G X 8 MM MISC USE THREE TIMES DAILY WITH INSULIN 100 each 0   valACYclovir (VALTREX) 500 MG tablet TAKE (1) TABLET BY MOUTH EVERY OTHER DAY. 45 tablet 1   VICTOZA 18 MG/3ML SOPN Start with 0.62m daily and increase by 0.640mper week until max dose of 1.8 mg dose achieved. 9 mL 0   Vitamin D, Ergocalciferol, (DRISDOL) 1.25 MG (50000 UNIT) CAPS capsule TAKE 1 CAPSULE BY MOUTH ONCE A MONTH 1 capsule 11     ROS: As per HPI otherwise negative.  Physical Exam: Vitals:   04/21/21 0245 04/21/21 0400 04/21/21 0447 04/21/21 0700  BP: (!) 120/59 137/66  127/65  Pulse: 79 80  72  Resp: _0 Temp:   98.1 F (36.7 C)   TempSrc:   Oral   SpO2: 97% 98%  98%  Weight:      Height:         General: Chronically ill appearing woman, on 2L [place]  Head: NCAT sclera not icteric MMM Neck: Supple. No JVD appreciated  Lungs: Normal  WOB, Occasional crackles at bases  Heart: RRR with S1 S2 Abdomen: soft obese non-tender  Lower extremities: trace  LE edema bilaterally  Neuro: A & O  X 3. Moves all extremities spontaneously. Psych:  Responds to questions appropriately with a normal affect. Dialysis Access: LUE AVF +bruit   Labs: Basic Metabolic Panel: Recent Labs  Lab 04/20/21 2030 04/20/21 2047 04/21/21 0447 04/21/21 0452 04/21/21 0456  NA 134* 134* 133* 131* 131*  K 4.8 4.8 5.2* 5.0 5.2*  CL 92* 95* 93*  --   --   CO2 25  --  22  --   --   GLUCOSE 191* 188* 407*  --   --   BUN 49* 72* 55*  --   --   CREATININE 10.60* 11.00* 11.09*  --   --   CALCIUM 7.8*  --  7.7*  --   --   PHOS  --   --  8.9*  --   --    Liver Function Tests: Recent Labs  Lab 04/20/21 2030 04/21/21 0447  AST 44* 41  ALT 26 30  ALKPHOS 153* 145*  BILITOT 1.1 1.2  PROT 6.7 6.9  ALBUMIN 2.8* 2.6*   No results for input(s): LIPASE, AMYLASE in the last 168 hours. No results for input(s): AMMONIA in the last 168 hours. CBC: Recent Labs  Lab 04/20/21 2030 04/20/21 2047 04/21/21 0447 04/21/21 0452 04/21/21 0456  WBC 5.4  --  4.7  --   --   NEUTROABS  3.9  --  4.1  --   --   HGB 12.8   < > 9.5* 9.9* 10.2*  HCT 40.4   < > 30.5* 29.0* 30.0*  MCV 97.3  --  100.0  --   --   PLT 106*  --  127*  --   --    < > = values in this interval not displayed.   Cardiac Enzymes: No results for input(s): CKTOTAL, CKMB, CKMBINDEX, TROPONINI in the last 168 hours. CBG: No results for input(s): GLUCAP in the last 168 hours. Iron Studies:  Recent Labs    04/21/21 0447  FERRITIN 1,382*   Studies/Results: CT ABDOMEN PELVIS WO CONTRAST  Result Date: 04/20/2021 CLINICAL DATA:  Short of breath, lethargy, abdominal distension EXAM: CT ABDOMEN AND PELVIS WITHOUT CONTRAST TECHNIQUE: Multidetector CT imaging of the abdomen and pelvis was performed following the standard protocol without IV contrast. COMPARISON:  01/01/2021 FINDINGS: Lower chest:  Hypoventilatory changes at the lung bases. No acute airspace disease or effusion. Hepatobiliary: Cholecystectomy. Unenhanced imaging of the liver is unremarkable. Pancreas: Unremarkable. No pancreatic ductal dilatation or surrounding inflammatory changes. Spleen: Normal in size without focal abnormality. Adrenals/Urinary Tract: Stable left renal cortical cyst. No urinary tract calculi or obstructive uropathy. Bladder is unremarkable. Adrenals are normal. Stomach/Bowel: No bowel obstruction or ileus. Postsurgical changes are seen from diverting colostomy left mid abdomen. No bowel wall thickening or inflammatory change. Vascular/Lymphatic: Aortic atherosclerosis. No enlarged abdominal or pelvic lymph nodes. Reproductive: Uterus and bilateral adnexa are unremarkable. Other: No free fluid or free gas. Musculoskeletal: No acute or destructive bony lesions. Stable postsurgical changes right inguinal region. Reconstructed images demonstrate no additional findings. IMPRESSION: 1. No acute intra-abdominal or intrapelvic process. 2. Stable postsurgical changes from diverting colostomy. No bowel obstruction or ileus. 3.  Aortic Atherosclerosis (ICD10-I70.0). Electronically Signed   By: Randa Ngo M.D.   On: 04/20/2021 23:07   CT HEAD WO CONTRAST (5MM)  Result Date: 04/20/2021 CLINICAL DATA:  Mental status change, unknown cause EXAM: CT HEAD WITHOUT CONTRAST TECHNIQUE: Contiguous axial images were obtained from the base of the skull through the vertex without intravenous contrast. COMPARISON:  09/26/2015 FINDINGS: Brain: Diffuse cerebral atrophy. No acute intracranial abnormality. Specifically, no hemorrhage, hydrocephalus, mass lesion, acute infarction, or significant intracranial injury. Vascular: No hyperdense vessel or unexpected calcification. Skull: No acute calvarial abnormality. Sinuses/Orbits: No acute findings Other: None IMPRESSION: Cerebral atrophy.  No acute intracranial abnormality. Electronically Signed    By: Rolm Baptise M.D.   On: 04/20/2021 23:04   DG Chest Port 1 View  Result Date: 04/21/2021 CLINICAL DATA:  65 year old female positive COVID-19. Shortness of breath. Right side chest pain. EXAM: PORTABLE CHEST 1 VIEW COMPARISON:  Portable chest 04/20/2021 and earlier. FINDINGS: Portable AP semi upright view at 0733 hours. Lower lung volumes. Stable cardiomegaly and mediastinal contours. Visualized tracheal air column is within normal limits. No pneumothorax, pulmonary edema or pleural effusion. Streaky bibasilar pulmonary opacity appears new or increased from yesterday. No consolidation. Paucity of bowel gas. No acute osseous abnormality identified. IMPRESSION: Lower lung volumes since yesterday with increased bibasilar atelectasis versus patchy COVID-19 pneumonia. No pleural effusion. Electronically Signed   By: Genevie Ann M.D.   On: 04/21/2021 07:55   DG Chest Port 1 View  Result Date: 04/20/2021 CLINICAL DATA:  Short of breath, hypoxic, missed dialysis EXAM: PORTABLE CHEST 1 VIEW COMPARISON:  03/05/2021 FINDINGS: Single frontal view of the chest demonstrates stable enlargement of the cardiac silhouette. Chronic vascular congestion without airspace  disease, effusion, or pneumothorax. No acute bony abnormalities. IMPRESSION: 1. Chronic vascular congestion without overt edema. Electronically Signed   By: Randa Ngo M.D.   On: 04/20/2021 20:44    Dialysis Orders:  AF TTS 4h 450/500 EDW 123 kg 2K/2Ca AVF No heparin -Mircera 60 q 2 weeks (last 10/6)  -Venofer 50 q wk  -Hectorol 10 TIW, Sensipar 30 TIW   Assessment/Plan: Covid-19 infection - Remdesivir, dexamethasone per primary  ESRD -  HD TTS. Missed Saturday. High inpatient dialysis census today. Labs ok. Will plan HD 1st shift tomorrow am.  Hypertension/volume  - BP elevated with volume on exam. Continue home meds. Should improve with UF on HD. Attempt 4L UF with next HD  Anemia  - Hgb 10.2. On ESA as outpatient. Follow trends.  Metabolic  bone disease -  Corr Ca ok. Continue home binders for now.  Nutrition - Renal diet with fluid restriction. Add prot supp for low albumin  S/p diverting colostomy   Lynnda Child PA-C [facility] 04/21/2021, 8:00 AM

## 2021-04-21 NOTE — ED Notes (Signed)
Breakfast order placed ?

## 2021-04-21 NOTE — ED Notes (Signed)
Dr. Candiss Norse was notified that blood sugar was 461. Provider ordered more insulin. Med requested from pharmacy.

## 2021-04-21 NOTE — Progress Notes (Signed)
Notified md of pt glucose 474, ws taken after pt has eaten, given these orders: Give her per PM Lantus 25 and Novolog 25 units now, recheck at 9 pm.

## 2021-04-21 NOTE — Progress Notes (Signed)
PROGRESS NOTE                                                                                                                                                                                                             Patient Demographics:    [name], is a 65 y.o. female, DOB - 01-31-1956, MVH:[id number]  Outpatient Primary MD for the patient is [facility] [place] date - 04/20/2021    Chief Complaint  Patient presents with   Shortness of Breath       Brief Narrative (HPI from H&P)  ETTAMAE [name] is a 65 y.o. female with medical history significant for end-stage renal disease on hemodialysis on Tuesday, Thursday, Saturday schedule, type 2 diabetes mellitus, chronic diastolic heart failure, hyperlipidemia, hypertension, presenting from home to [facility] ED complaining of shortness of breath, missed last HD treatment 2 days ago, now in CHF with incidental Covid infection.   Subjective:    [name] today has, No headache, No chest pain, No abdominal pain - No Nausea, No new weakness tingling or numbness, mild SOB.   Assessment  & Plan :     Acute Hypoxic Resp. Failure due to Acute on chronic diastolic CHF EF 84% echocardiogram in 2020 with incidental COVID-19 infection - this is likely due to missed HD treatment, Renal called, clinically no Sepsis, since steroids and Remdesivir started, short course and monitor.  Encouraged the patient to sit up in chair in the daytime use I-S and flutter valve for pulmonary toiletry.  Will advance activity and titrate down oxygen as possible.  SpO2: 98 % O2 Flow Rate (L/min): 2 L/min   2. ESRD - TTS, missed last Rx, counseled, Renal called.  3. Acute Metabolic Encephalopathy  - due to #1 and missed HD, CT head stable, no F.N deficits, hold Lyrica, HD, o2.  4. Obesity - BMI 40, follow with PCP.  5. Dyslipidemia - statin.  6. GERD  - PPI upon DC.  7. HTN -  added Norvasc + PRN Hydralazine.  8 DM 2 - CBGs high due to Steroids, placed on Lantus, Premeal Novolog + ISS  Lab Results  Component Value Date   HGBA1C 12.9 (A) 03/03/2021    CBG (last 3)  Recent Labs    04/21/21 0800  GLUCAP 461*  Condition - Extremely Guarded  Family Communication  :  daughter [phone] message left on 04/21/2016 at 10:57 AM   Code Status :  Full  Consults  :  Renal  PUD Prophylaxis :    Procedures  :     CT Abd Pelvis - CT Head  - Non acute      Disposition Plan  :    Status is: Inpatient  Remains inpatient appropriate because:IV treatments appropriate due to intensity of illness or inability to take PO  Dispo: The patient is from: Home              Anticipated d/c is to: Home              Patient currently is not medically stable to d/c.   Difficult to place patient No   DVT Prophylaxis  :    SCDs Start: 04/20/21 2333  Heparin    Lab Results  Component Value Date   PLT 127 (L) 04/21/2021    Diet :  Diet Order             Diet renal with fluid restriction Fluid restriction: 2000 mL Fluid; Room service appropriate? Yes; Fluid consistency: Thin  Diet effective now                    Inpatient Medications  Scheduled Meds:  aspirin EC  81 mg Oral Daily   atorvastatin  80 mg Oral QPM   dexamethasone (DECADRON) injection  6 mg Intravenous Q24H   insulin aspart  0-20 Units Subcutaneous TID WC   insulin aspart  0-5 Units Subcutaneous QHS   insulin aspart  3 Units Subcutaneous TID WC   insulin detemir  25 Units Subcutaneous BID   sevelamer carbonate  800 mg Oral TID WC   Continuous Infusions:  remdesivir 100 mg in NS 100 mL     PRN Meds:.acetaminophen **OR** acetaminophen, albuterol  Antibiotics  :    Anti-infectives (From admission, onward)    Start     Dose/Rate Route Frequency Ordered Stop   04/21/21 1000  remdesivir 100 mg in sodium chloride 0.9 % 100 mL IVPB       See Hyperspace for full Linked  Orders Report.   100 mg 200 mL/hr over 30 Minutes Intravenous Daily 04/20/21 2237 04/25/21 0959   04/20/21 2245  remdesivir 200 mg in sodium chloride 0.9% 250 mL IVPB       See Hyperspace for full Linked Orders Report.   200 mg 580 mL/hr over 30 Minutes Intravenous Once 04/20/21 2237 04/21/21 0006        Time Spent in minutes  30   Lala Lund M.D on 04/21/2021 at 10:56 AM  To page go to [email or web address]   [facility] -  Office  [phone]  See all Orders from today for further details    Objective:   Vitals:   04/21/21 0700 04/21/21 0715 04/21/21 0745 04/21/21 0830  BP: 127/65 (!) 153/68 (!) 144/68 (!) 141/119  Pulse: 72 73 73 75  Resp: 13 15 16 16   Temp:   98 F (36.7 C)   TempSrc:   Oral   SpO2: 98% 98% 98% 98%  Weight:      Height:        Wt Readings from Last 3 Encounters:  04/20/21 125 kg  03/24/21 125.2 kg  11/19/20 126.6 kg     Intake/Output Summary (Last 24 hours) at 04/21/2021 1056 Last data filed at 04/21/2021 0006  Gross per 24 hour  Intake 270 ml  Output --  Net 270 ml     Physical Exam  Awake Alert x 1, No new F.N deficits,   [place].AT,PERRAL Supple Neck, No JVD,   Symmetrical Chest wall movement, Good air movement bilaterally, ++ rales RRR,No Gallops,Rubs or new Murmurs, No Parasternal Heave +ve B.Sounds, Abd Soft, No tenderness,   No Cyanosis,     Data Review:    CBC Recent Labs  Lab 04/20/21 2030 04/20/21 2047 04/21/21 0447 04/21/21 0452 04/21/21 0456  WBC 5.4  --  4.7  --   --   HGB 12.8 18.7* 9.5* 9.9* 10.2*  HCT 40.4 55.0* 30.5* 29.0* 30.0*  PLT 106*  --  127*  --   --   MCV 97.3  --  100.0  --   --   MCH 30.8  --  31.1  --   --   MCHC 31.7  --  31.1  --   --   RDW 14.2  --  14.0  --   --   LYMPHSABS 0.9  --  0.4*  --   --   MONOABS 0.5  --  0.2  --   --   EOSABS 0.0  --  0.0  --   --   BASOSABS 0.0  --  0.0  --   --     Recent Labs  Lab 04/20/21 2030 04/20/21 2047 04/21/21 0447 04/21/21 0452  04/21/21 0456  NA 134* 134* 133* 131* 131*  K 4.8 4.8 5.2* 5.0 5.2*  CL 92* 95* 93*  --   --   CO2 25  --  22  --   --   GLUCOSE 191* 188* 407*  --   --   BUN 49* 72* 55*  --   --   CREATININE 10.60* 11.00* 11.09*  --   --   CALCIUM 7.8*  --  7.7*  --   --   AST 44*  --  41  --   --   ALT 26  --  30  --   --   ALKPHOS 153*  --  145*  --   --   BILITOT 1.1  --  1.2  --   --   ALBUMIN 2.8*  --  2.6*  --   --   MG  --   --  2.0  --   --   CRP  --   --  8.1*  --   --   DDIMER  --   --  0.65*  --   --   PROCALCITON  --   --  1.17  --   --   LATICACIDVEN 1.4  --   --   --   --   TSH  --   --  0.715  --   --   BNP  --   --  107.2*  --   --     ------------------------------------------------------------------------------------------------------------------ No results for input(s): CHOL, HDL, LDLCALC, TRIG, CHOLHDL, LDLDIRECT in the last 72 hours.  Lab Results  Component Value Date   HGBA1C 12.9 (A) 03/03/2021   ------------------------------------------------------------------------------------------------------------------ Recent Labs    04/21/21 0447  TSH 0.715    Cardiac Enzymes No results for input(s): CKMB, TROPONINI, MYOGLOBIN in the last 168 hours.  Invalid input(s): CK ------------------------------------------------------------------------------------------------------------------    Component Value Date/Time   BNP 107.2 (H) 04/21/2021 0447   BNP 137.2 (H) 09/23/2016 1451    Radiology Reports CT  ABDOMEN PELVIS WO CONTRAST  Result Date: 04/20/2021 CLINICAL DATA:  Short of breath, lethargy, abdominal distension EXAM: CT ABDOMEN AND PELVIS WITHOUT CONTRAST TECHNIQUE: Multidetector CT imaging of the abdomen and pelvis was performed following the standard protocol without IV contrast. COMPARISON:  01/01/2021 FINDINGS: Lower chest: Hypoventilatory changes at the lung bases. No acute airspace disease or effusion. Hepatobiliary: Cholecystectomy. Unenhanced imaging of the  liver is unremarkable. Pancreas: Unremarkable. No pancreatic ductal dilatation or surrounding inflammatory changes. Spleen: Normal in size without focal abnormality. Adrenals/Urinary Tract: Stable left renal cortical cyst. No urinary tract calculi or obstructive uropathy. Bladder is unremarkable. Adrenals are normal. Stomach/Bowel: No bowel obstruction or ileus. Postsurgical changes are seen from diverting colostomy left mid abdomen. No bowel wall thickening or inflammatory change. Vascular/Lymphatic: Aortic atherosclerosis. No enlarged abdominal or pelvic lymph nodes. Reproductive: Uterus and bilateral adnexa are unremarkable. Other: No free fluid or free gas. Musculoskeletal: No acute or destructive bony lesions. Stable postsurgical changes right inguinal region. Reconstructed images demonstrate no additional findings. IMPRESSION: 1. No acute intra-abdominal or intrapelvic process. 2. Stable postsurgical changes from diverting colostomy. No bowel obstruction or ileus. 3.  Aortic Atherosclerosis (ICD10-I70.0). Electronically Signed   By: Randa Ngo M.D.   On: 04/20/2021 23:07   CT HEAD WO CONTRAST (5MM)  Result Date: 04/20/2021 CLINICAL DATA:  Mental status change, unknown cause EXAM: CT HEAD WITHOUT CONTRAST TECHNIQUE: Contiguous axial images were obtained from the base of the skull through the vertex without intravenous contrast. COMPARISON:  09/26/2015 FINDINGS: Brain: Diffuse cerebral atrophy. No acute intracranial abnormality. Specifically, no hemorrhage, hydrocephalus, mass lesion, acute infarction, or significant intracranial injury. Vascular: No hyperdense vessel or unexpected calcification. Skull: No acute calvarial abnormality. Sinuses/Orbits: No acute findings Other: None IMPRESSION: Cerebral atrophy.  No acute intracranial abnormality. Electronically Signed   By: Rolm Baptise M.D.   On: 04/20/2021 23:04   DG Chest Port 1 View  Result Date: 04/21/2021 CLINICAL DATA:  65 year old female  positive COVID-19. Shortness of breath. Right side chest pain. EXAM: PORTABLE CHEST 1 VIEW COMPARISON:  Portable chest 04/20/2021 and earlier. FINDINGS: Portable AP semi upright view at 0733 hours. Lower lung volumes. Stable cardiomegaly and mediastinal contours. Visualized tracheal air column is within normal limits. No pneumothorax, pulmonary edema or pleural effusion. Streaky bibasilar pulmonary opacity appears new or increased from yesterday. No consolidation. Paucity of bowel gas. No acute osseous abnormality identified. IMPRESSION: Lower lung volumes since yesterday with increased bibasilar atelectasis versus patchy COVID-19 pneumonia. No pleural effusion. Electronically Signed   By: Genevie Ann M.D.   On: 04/21/2021 07:55   DG Chest Port 1 View  Result Date: 04/20/2021 CLINICAL DATA:  Short of breath, hypoxic, missed dialysis EXAM: PORTABLE CHEST 1 VIEW COMPARISON:  03/05/2021 FINDINGS: Single frontal view of the chest demonstrates stable enlargement of the cardiac silhouette. Chronic vascular congestion without airspace disease, effusion, or pneumothorax. No acute bony abnormalities. IMPRESSION: 1. Chronic vascular congestion without overt edema. Electronically Signed   By: Randa Ngo M.D.   On: 04/20/2021 20:44   DG Knee Complete 4 Views Left  Result Date: 03/24/2021 CLINICAL DATA:  Pain and decreased mobility. EXAM: LEFT KNEE - COMPLETE 4+ VIEW COMPARISON:  01/31/2020 FINDINGS: Small knee joint effusion. Medial compartment osteoarthritis with joint space narrowing and marginal osteophytes. Some degenerative change also at the patellofemoral joint. Regional arterial calcification is noted. IMPRESSION: Medial compartment and patellofemoral compartment osteoarthritis. Small joint effusion. Electronically Signed   By: Nelson Chimes M.D.   On: 03/24/2021 14:38  VAS [place] LOWER EXTREMITY VENOUS (DVT) ([facility] and [facility] 7a-7p)  Result Date: 03/24/2021  Lower Venous DVT Study Patient Name:  [name]  Date of  Exam:   03/24/2021 Medical Rec #: [id number]      Accession #:    [id number] Date of Birth: 1956/06/02      Patient Gender: F Patient Age:   30 years Exam Location:  [facility] Procedure:      VAS [place] LOWER EXTREMITY VENOUS (DVT) Referring Phys: SOPHIA CACCAVALE --------------------------------------------------------------------------------  Indications: Pain.  Limitations: Suboptimal due to patient positioning/scanning conditions as well as body habitus/patient pain tolerance. Comparison Study: 05-16-2020 Most recent prior study- right lower extremity                   venous was negative for DVT. Performing Technologist: Darlin Coco RDMS, RVT  Examination Guidelines: A complete evaluation includes B-mode imaging, spectral Doppler, color Doppler, and power Doppler as needed of all accessible portions of each vessel. Bilateral testing is considered an integral part of a complete examination. Limited examinations for reoccurring indications may be performed as noted. The reflux portion of the exam is performed with the patient in reverse Trendelenburg.  +---------+---------------+---------+-----------+----------+-------------------+ RIGHT    CompressibilityPhasicitySpontaneityPropertiesThrombus Aging      +---------+---------------+---------+-----------+----------+-------------------+ CFV      Full           Yes      Yes                                      +---------+---------------+---------+-----------+----------+-------------------+ SFJ      Full                                                             +---------+---------------+---------+-----------+----------+-------------------+ FV Prox  Full                                                             +---------+---------------+---------+-----------+----------+-------------------+ FV Mid   Full                                                              +---------+---------------+---------+-----------+----------+-------------------+ FV DistalFull                                                             +---------+---------------+---------+-----------+----------+-------------------+ PFV      Full                                                             +---------+---------------+---------+-----------+----------+-------------------+  POP      Full           Yes      Yes                                      +---------+---------------+---------+-----------+----------+-------------------+ PTV                     Yes      Yes                                      +---------+---------------+---------+-----------+----------+-------------------+ PERO                                                  Not well visualized +---------+---------------+---------+-----------+----------+-------------------+   +---------+---------------+---------+-----------+----------+-------------------+ LEFT     CompressibilityPhasicitySpontaneityPropertiesThrombus Aging      +---------+---------------+---------+-----------+----------+-------------------+ CFV      Full           Yes      Yes                                      +---------+---------------+---------+-----------+----------+-------------------+ SFJ      Full                                                             +---------+---------------+---------+-----------+----------+-------------------+ FV Prox  Full                                                             +---------+---------------+---------+-----------+----------+-------------------+ FV Mid   Full                                                             +---------+---------------+---------+-----------+----------+-------------------+ FV Distal               Yes      Yes                                      +---------+---------------+---------+-----------+----------+-------------------+  PFV      Full                                                             +---------+---------------+---------+-----------+----------+-------------------+ POP      Full  Yes      Yes                                      +---------+---------------+---------+-----------+----------+-------------------+ PTV                     Yes      Yes                                      +---------+---------------+---------+-----------+----------+-------------------+ PERO                                                  Not well visualized +---------+---------------+---------+-----------+----------+-------------------+     Summary: RIGHT: - There is no evidence of deep vein thrombosis in the lower extremity. However, portions of this examination were limited- see technologist comments above.  - No cystic structure found in the popliteal fossa.  LEFT: - There is no evidence of deep vein thrombosis in the lower extremity. However, portions of this examination were limited- see technologist comments above.  - No cystic structure found in the popliteal fossa.  *See table(s) above for measurements and observations. Electronically signed by Servando Snare MD on 03/24/2021 at 11:00:17 PM.    Final

## 2021-04-21 NOTE — ED Notes (Signed)
Notified Dr. Candiss Norse regarding blood sugar of 421. Waiting for any additional orders.

## 2021-04-22 ENCOUNTER — Inpatient Hospital Stay (HOSPITAL_COMMUNITY): Payer: Medicare Other

## 2021-04-22 ENCOUNTER — Other Ambulatory Visit: Payer: Self-pay | Admitting: Physician Assistant

## 2021-04-22 ENCOUNTER — Encounter (HOSPITAL_COMMUNITY): Payer: Medicare Other

## 2021-04-22 DIAGNOSIS — N184 Chronic kidney disease, stage 4 (severe): Secondary | ICD-10-CM

## 2021-04-22 DIAGNOSIS — E1122 Type 2 diabetes mellitus with diabetic chronic kidney disease: Secondary | ICD-10-CM

## 2021-04-22 DIAGNOSIS — Z794 Long term (current) use of insulin: Secondary | ICD-10-CM

## 2021-04-22 LAB — COMPREHENSIVE METABOLIC PANEL
ALT: 23 U/L (ref 0–44)
AST: 20 U/L (ref 15–41)
Albumin: 2.5 g/dL — ABNORMAL LOW (ref 3.5–5.0)
Alkaline Phosphatase: 137 U/L — ABNORMAL HIGH (ref 38–126)
Anion gap: 17 — ABNORMAL HIGH (ref 5–15)
BUN: 83 mg/dL — ABNORMAL HIGH (ref 8–23)
CO2: 22 mmol/L (ref 22–32)
Calcium: 7.6 mg/dL — ABNORMAL LOW (ref 8.9–10.3)
Chloride: 91 mmol/L — ABNORMAL LOW (ref 98–111)
Creatinine, Ser: 12.15 mg/dL — ABNORMAL HIGH (ref 0.44–1.00)
GFR, Estimated: 3 mL/min — ABNORMAL LOW (ref >60)
Glucose, Bld: 387 mg/dL — ABNORMAL HIGH (ref 70–99)
Potassium: 4.3 mmol/L (ref 3.5–5.1)
Sodium: 130 mmol/L — ABNORMAL LOW (ref 135–145)
Total Bilirubin: 0.6 mg/dL (ref 0.3–1.2)
Total Protein: 6.4 g/dL — ABNORMAL LOW (ref 6.5–8.1)

## 2021-04-22 LAB — CBC WITH DIFFERENTIAL/PLATELET
Abs Immature Granulocytes: 0.07 10*3/uL (ref 0.00–0.07)
Basophils Absolute: 0 10*3/uL (ref 0.0–0.1)
Basophils Relative: 0 %
Eosinophils Absolute: 0 10*3/uL (ref 0.0–0.5)
Eosinophils Relative: 0 %
HCT: 28 % — ABNORMAL LOW (ref 36.0–46.0)
Hemoglobin: 9.3 g/dL — ABNORMAL LOW (ref 12.0–15.0)
Immature Granulocytes: 2 %
Lymphocytes Relative: 9 %
Lymphs Abs: 0.4 10*3/uL — ABNORMAL LOW (ref 0.7–4.0)
MCH: 31 pg (ref 26.0–34.0)
MCHC: 33.2 g/dL (ref 30.0–36.0)
MCV: 93.3 fL (ref 80.0–100.0)
Monocytes Absolute: 0.3 10*3/uL (ref 0.1–1.0)
Monocytes Relative: 8 %
Neutro Abs: 3.2 10*3/uL (ref 1.7–7.7)
Neutrophils Relative %: 81 %
Platelets: 173 10*3/uL (ref 150–400)
RBC: 3 MIL/uL — ABNORMAL LOW (ref 3.87–5.11)
RDW: 13.6 % (ref 11.5–15.5)
WBC: 3.9 10*3/uL — ABNORMAL LOW (ref 4.0–10.5)
nRBC: 0 % (ref 0.0–0.2)

## 2021-04-22 LAB — GLUCOSE, CAPILLARY
Glucose-Capillary: 389 mg/dL — ABNORMAL HIGH (ref 70–99)
Glucose-Capillary: 182 mg/dL — ABNORMAL HIGH (ref 70–99)
Glucose-Capillary: 277 mg/dL — ABNORMAL HIGH (ref 70–99)
Glucose-Capillary: 248 mg/dL — ABNORMAL HIGH (ref 70–99)

## 2021-04-22 LAB — MAGNESIUM: Magnesium: 2.2 mg/dL (ref 1.7–2.4)

## 2021-04-22 LAB — D-DIMER, QUANTITATIVE: D-Dimer, Quant: 0.46 ug/mL-FEU (ref 0.00–0.50)

## 2021-04-22 LAB — C-REACTIVE PROTEIN: CRP: 6.2 mg/dL — ABNORMAL HIGH (ref <1.0)

## 2021-04-22 LAB — BRAIN NATRIURETIC PEPTIDE: B Natriuretic Peptide: 203.7 pg/mL — ABNORMAL HIGH (ref 0.0–100.0)

## 2021-04-22 LAB — CALCIUM, IONIZED: Calcium, Ionized, Serum: 4.1 mg/dL — ABNORMAL LOW (ref 4.5–5.6)

## 2021-04-22 MED ORDER — TRAMADOL HCL 50 MG PO TABS
50.0000 mg | ORAL_TABLET | Freq: Three times a day (TID) | ORAL | Status: DC | PRN
  Administered 2021-04-22: 50 mg via ORAL
  Filled 2021-04-22: qty 1

## 2021-04-22 MED ORDER — DEXAMETHASONE SODIUM PHOSPHATE 4 MG/ML IJ SOLN
2.0000 mg | INTRAMUSCULAR | Status: DC
  Administered 2021-04-23: 2 mg via INTRAVENOUS
  Filled 2021-04-22: qty 1

## 2021-04-22 MED ORDER — INSULIN ASPART 100 UNIT/ML IJ SOLN
6.0000 [IU] | Freq: Three times a day (TID) | INTRAMUSCULAR | Status: DC
  Administered 2021-04-22 – 2021-04-23 (×3): 6 [IU] via SUBCUTANEOUS

## 2021-04-22 MED ORDER — INSULIN GLARGINE-YFGN 100 UNIT/ML ~~LOC~~ SOLN
5.0000 [IU] | Freq: Once | SUBCUTANEOUS | Status: AC
  Administered 2021-04-22: 5 [IU] via SUBCUTANEOUS
  Filled 2021-04-22: qty 0.05

## 2021-04-22 MED ORDER — INSULIN DETEMIR 100 UNIT/ML ~~LOC~~ SOLN
30.0000 [IU] | Freq: Two times a day (BID) | SUBCUTANEOUS | Status: DC
  Administered 2021-04-22 – 2021-04-23 (×2): 30 [IU] via SUBCUTANEOUS
  Filled 2021-04-22 (×3): qty 0.3

## 2021-04-22 MED ORDER — INSULIN ASPART 100 UNIT/ML IJ SOLN
12.0000 [IU] | Freq: Once | INTRAMUSCULAR | Status: AC
  Administered 2021-04-22: 12 [IU] via SUBCUTANEOUS

## 2021-04-22 NOTE — Progress Notes (Signed)
Inpatient Diabetes Program Recommendations  AACE/ADA: New Consensus Statement on Inpatient Glycemic Control (2015)  Target Ranges:  Prepandial:   less than 140 mg/dL      Peak postprandial:   less than 180 mg/dL (1-2 hours)      Critically ill patients:  140 - 180 mg/dL   Lab Results  Component Value Date   GLUCAP 389 (H) 04/22/2021   HGBA1C 12.8 (H) 04/21/2021    Review of Glycemic Control Results for [facility], UPHOFF (MRN [id number]) as of 04/22/2021 09:35  Ref. Range 04/21/2021 18:24 04/21/2021 20:13 04/21/2021 23:48 04/22/2021 07:54  Glucose-Capillary Latest Ref Range: 70 - 99 mg/dL 474 (H) 528 (HH) 458 (H) 389 (H)   Diabetes history: Type 2 DM Outpatient Diabetes medications: Levemir 60 units BID, Humalog 7 units TID, Victoza QD Current orders for Inpatient glycemic control: Levemir 25 units BID, Novolog 3 units TID, Novolog 0-20 units TID, Novolog 0-5 units QHS Decadron 6 mg QD  Inpatient Diabetes Program Recommendations:    Consider increasing Levemir to 40 units BID and Novolog 5 units TID.   Spoke with patient regarding outpatient diabetes management. Patient admits to missing doses with meals and is in need of glucometer. States, "I have been running above 30." Reviewed patient's current A1c of 12.8%. Explained what a A1c is and what it measures. Also reviewed goal A1c with patient, importance of good glucose control @ home, and blood sugar goals. Reviewed patho, need for insulin, current glucose trends, vascular changes and commorbidities. Patient denies drinking sugary beverages. Reviewed when to call MD. Has PCP follow up within the week and attempts have been made for outpatient endo referrals.  Please add glucometer when discharging patient #[id number].  Thanks, Bronson Curb, MSN, RNC-OB Diabetes Coordinator [phone] (8a-5p)

## 2021-04-22 NOTE — Evaluation (Signed)
Physical Therapy Evaluation Patient Details Name: [name] MRN: [id number] DOB: 25-Apr-1956 Today's Date: 04/22/2021  History of Present Illness  65 y.o. female presenting 04/20/21 from home to [facility] ED complaining of shortness of breath. admitted with severe COVID-19 infection  PMH end-stage renal disease on hemodialysis on Tuesday, Thursday, Saturday schedule, type 2 diabetes mellitus, chronic diastolic heart failure, hyperlipidemia, hypertension  Clinical Impression   Pt admitted secondary to problem above with deficits below. PTA patient was ambulating with occasional use of RW. Going to OP dialysis. Pt currently requires min assist for decreased balance with transfer and stepping in place.  Anticipate patient will benefit from acute PT to address problems listed below.Will continue to follow acutely to maximize functional mobility independence and safety.          Recommendations for follow up therapy are one component of a multi-disciplinary discharge planning process, led by the attending physician.  Recommendations may be updated based on patient status, additional functional criteria and insurance authorization.  Follow Up Recommendations No PT follow up    Equipment Recommendations  None recommended by PT    Recommendations for Other Services       Precautions / Restrictions Precautions Precautions: Fall Precaution Comments: monitor sats      Mobility  Bed Mobility Overal bed mobility: Modified Independent             General bed mobility comments: HOB elevated, pt popped up to long-sitting and then turned to put feet on floor    Transfers Overall transfer level: Needs assistance Equipment used: None Transfers: Sit to/from Stand;Stand Pivot Transfers Sit to Stand: Min guard Stand pivot transfers: Min assist       General transfer comment: guarding for safety as come to stand from bed and from chair; min assist to steady with pivoting  Ambulation/Gait              General Gait Details: unable due to need for portable O2; stepping in place with slight imbalance/unsteadiness  Stairs            Wheelchair Mobility    Modified Rankin (Stroke Patients Only)       Balance Overall balance assessment: Mild deficits observed, not formally tested                                           Pertinent Vitals/Pain Pain Assessment: Faces Faces Pain Scale: Hurts little more Pain Location: bil calves Pain Descriptors / Indicators: Cramping;Tender Pain Intervention(s): Limited activity within patient's tolerance    Home Living Family/patient expects to be discharged to:: Private residence Living Arrangements: Children Available Help at Discharge: Family;Available 24 hours/day Type of Home: House Home Access: Stairs to enter Entrance Stairs-Rails: Can reach both Entrance Stairs-Number of Steps: 1 Home Layout: One level Home Equipment: Walker - 2 wheels;Bedside commode;Tub bench      Prior Function Level of Independence: Needs assistance   Gait / Transfers Assistance Needed: ambulates household distances; sometimes uses RW  ADL's / Homemaking Assistance Needed: daughter assists with bathing; cooking; cleaning        Hand Dominance   Dominant Hand: Right    Extremity/Trunk Assessment   Upper Extremity Assessment Upper Extremity Assessment: Defer to OT evaluation    Lower Extremity Assessment Lower Extremity Assessment: Generalized weakness    Cervical / Trunk Assessment Cervical / Trunk Assessment: Other exceptions Cervical /  Trunk Exceptions: overweight  Communication   Communication: No difficulties  Cognition Arousal/Alertness: Awake/alert Behavior During Therapy: WFL for tasks assessed/performed Overall Cognitive Status: Within Functional Limits for tasks assessed                                        General Comments General comments (skin integrity, edema, etc.): on  3L on arrival with sats 98%; on room air dropped to 86% as come to sit EOB; resumed at 2L with sats 92% during transfer    Exercises     Assessment/Plan    PT Assessment Patient needs continued PT services  PT Problem List Decreased strength;Decreased activity tolerance;Decreased balance;Decreased mobility;Cardiopulmonary status limiting activity;Obesity;Pain       PT Treatment Interventions DME instruction;Gait training;Functional mobility training;Therapeutic activities;Therapeutic exercise;Balance training;Patient/family education    PT Goals (Current goals can be found in the Care Plan section)  Acute Rehab PT Goals Patient Stated Goal: keep strength up PT Goal Formulation: With patient Time For Goal Achievement: 05/06/21 Potential to Achieve Goals: Good    Frequency Min 3X/week   Barriers to discharge        Co-evaluation               AM-PAC PT "6 Clicks" Mobility  Outcome Measure Help needed turning from your back to your side while in a flat bed without using bedrails?: None Help needed moving from lying on your back to sitting on the side of a flat bed without using bedrails?: None Help needed moving to and from a bed to a chair (including a wheelchair)?: A Little Help needed standing up from a chair using your arms (e.g., wheelchair or bedside chair)?: A Little Help needed to walk in hospital room?: A Little Help needed climbing 3-5 steps with a railing? : A Lot 6 Click Score: 19    End of Session Equipment Utilized During Treatment: Oxygen Activity Tolerance: Treatment limited secondary to medical complications (Comment) (drop in sats on RA) Patient left: in chair;with call bell/phone within reach;with nursing/sitter in room Nurse Communication: Mobility status PT Visit Diagnosis: Unsteadiness on feet (R26.81);Muscle weakness (generalized) (M62.81)    Time: 2707-8675 PT Time Calculation (min) (ACUTE ONLY): 35 min   Charges:   PT Evaluation $PT Eval  Moderate Complexity: 1 Mod PT Treatments $Therapeutic Activity: 8-22 mins        . Arby Barrette, PT Pager [phone]   Rexanne Mano 04/22/2021, 9:55 AM

## 2021-04-22 NOTE — Progress Notes (Signed)
OT Cancellation Note  Patient Details Name: CONNY SITU MRN: [id number] DOB: 1955-11-26   Cancelled Treatment:    Reason Eval/Treat Not Completed: Patient at procedure or test/ unavailable: bedside HD.  Julien Girt 04/22/2021, 9:58 AM

## 2021-04-22 NOTE — Progress Notes (Signed)
Treatment time changed to 3.5 hrs per Dr. Candiss Norse. Time adjusted for order change.

## 2021-04-22 NOTE — Progress Notes (Signed)
Pt has completed HD in room. Dialysis nurse reported 4 liters removed.

## 2021-04-22 NOTE — Progress Notes (Signed)
Dialysis nurse has entered pt's room to start pt's dialysis treatment.

## 2021-04-22 NOTE — Progress Notes (Signed)
[facility] Progress Note    Assessment/ Plan:   Covid-19 infection - Remdesivir, dexamethasone per primary  ESRD -  HD on usual TTS sched Hypertension/volume  - BP elevated with volume on exam. Continue home meds. Should improve with UF on HD. Attempt 4L UF today Anemia  - Hgb 10.2. On ESA as outpatient. Follow trends.  Metabolic bone disease -  Corr Ca ok. Continue home binders for now.  Nutrition - Renal diet with fluid restriction. Added prot supp for low albumin  S/p diverting colostomy   Dialysis Orders:  AF TTS 4h 450/500 EDW 123 kg 2K/2Ca AVF No heparin -Mircera 60 q 2 weeks (last 10/6)  -Venofer 50 q wk  -Hectorol 10 TIW, Sensipar 30 TIW   Subjective:   No acute events. HD underway currently. Issues with hyperglycemia yesterday. Patient not examined today in the context of her being COVID positive and in efforts to limit exposure and conserve PPE.   Objective:   BP 136/75 (BP Location: Right Arm)   Pulse 64   Temp 97.8 F (36.6 C) (Oral)   Resp 12   Ht 5\' 8"  (1.727 m)   Wt 124 kg   SpO2 98%   BMI 41.57 kg/m   Intake/Output Summary (Last 24 hours) at 04/22/2021 1022 Last data filed at 04/21/2021 1302 Gross per 24 hour  Intake 98.93 ml  Output --  Net 98.93 ml   Weight change: -0.5 kg  Physical Exam: Not examined today in the context of COVID positive, limiting exposure, limiting PPE use.  Imaging: CT ABDOMEN PELVIS WO CONTRAST  Result Date: 04/20/2021 CLINICAL DATA:  Short of breath, lethargy, abdominal distension EXAM: CT ABDOMEN AND PELVIS WITHOUT CONTRAST TECHNIQUE: Multidetector CT imaging of the abdomen and pelvis was performed following the standard protocol without IV contrast. COMPARISON:  01/01/2021 FINDINGS: Lower chest: Hypoventilatory changes at the lung bases. No acute airspace disease or effusion. Hepatobiliary: Cholecystectomy. Unenhanced imaging of the liver is unremarkable. Pancreas: Unremarkable. No pancreatic ductal  dilatation or surrounding inflammatory changes. Spleen: Normal in size without focal abnormality. Adrenals/Urinary Tract: Stable left renal cortical cyst. No urinary tract calculi or obstructive uropathy. Bladder is unremarkable. Adrenals are normal. Stomach/Bowel: No bowel obstruction or ileus. Postsurgical changes are seen from diverting colostomy left mid abdomen. No bowel wall thickening or inflammatory change. Vascular/Lymphatic: Aortic atherosclerosis. No enlarged abdominal or pelvic lymph nodes. Reproductive: Uterus and bilateral adnexa are unremarkable. Other: No free fluid or free gas. Musculoskeletal: No acute or destructive bony lesions. Stable postsurgical changes right inguinal region. Reconstructed images demonstrate no additional findings. IMPRESSION: 1. No acute intra-abdominal or intrapelvic process. 2. Stable postsurgical changes from diverting colostomy. No bowel obstruction or ileus. 3.  Aortic Atherosclerosis (ICD10-I70.0). Electronically Signed   By: Randa Ngo M.D.   On: 04/20/2021 23:07   CT HEAD WO CONTRAST (5MM)  Result Date: 04/20/2021 CLINICAL DATA:  Mental status change, unknown cause EXAM: CT HEAD WITHOUT CONTRAST TECHNIQUE: Contiguous axial images were obtained from the base of the skull through the vertex without intravenous contrast. COMPARISON:  09/26/2015 FINDINGS: Brain: Diffuse cerebral atrophy. No acute intracranial abnormality. Specifically, no hemorrhage, hydrocephalus, mass lesion, acute infarction, or significant intracranial injury. Vascular: No hyperdense vessel or unexpected calcification. Skull: No acute calvarial abnormality. Sinuses/Orbits: No acute findings Other: None IMPRESSION: Cerebral atrophy.  No acute intracranial abnormality. Electronically Signed   By: Rolm Baptise M.D.   On: 04/20/2021 23:04   DG Chest Port 1 View  Result Date: 04/21/2021 CLINICAL DATA:  65 year old female positive COVID-19. Shortness of breath. Right side chest pain. EXAM:  PORTABLE CHEST 1 VIEW COMPARISON:  Portable chest 04/20/2021 and earlier. FINDINGS: Portable AP semi upright view at 0733 hours. Lower lung volumes. Stable cardiomegaly and mediastinal contours. Visualized tracheal air column is within normal limits. No pneumothorax, pulmonary edema or pleural effusion. Streaky bibasilar pulmonary opacity appears new or increased from yesterday. No consolidation. Paucity of bowel gas. No acute osseous abnormality identified. IMPRESSION: Lower lung volumes since yesterday with increased bibasilar atelectasis versus patchy COVID-19 pneumonia. No pleural effusion. Electronically Signed   By: Genevie Ann M.D.   On: 04/21/2021 07:55   DG Chest Port 1 View  Result Date: 04/20/2021 CLINICAL DATA:  Short of breath, hypoxic, missed dialysis EXAM: PORTABLE CHEST 1 VIEW COMPARISON:  03/05/2021 FINDINGS: Single frontal view of the chest demonstrates stable enlargement of the cardiac silhouette. Chronic vascular congestion without airspace disease, effusion, or pneumothorax. No acute bony abnormalities. IMPRESSION: 1. Chronic vascular congestion without overt edema. Electronically Signed   By: Randa Ngo M.D.   On: 04/20/2021 20:44    Labs: BMET Recent Labs  Lab 04/20/21 2030 04/20/21 2047 04/21/21 0447 04/21/21 0452 04/21/21 0456  NA 134* 134* 133* 131* 131*  K 4.8 4.8 5.2* 5.0 5.2*  CL 92* 95* 93*  --   --   CO2 25  --  22  --   --   GLUCOSE 191* 188* 407*  --   --   BUN 49* 72* 55*  --   --   CREATININE 10.60* 11.00* 11.09*  --   --   CALCIUM 7.8*  --  7.7*  --   --   PHOS  --   --  8.9*  --   --    CBC Recent Labs  Lab 04/20/21 2030 04/20/21 2047 04/21/21 0447 04/21/21 0452 04/21/21 0456  WBC 5.4  --  4.7  --   --   NEUTROABS 3.9  --  4.1  --   --   HGB 12.8 18.7* 9.5* 9.9* 10.2*  HCT 40.4 55.0* 30.5* 29.0* 30.0*  MCV 97.3  --  100.0  --   --   PLT 106*  --  127*  --   --     Medications:     amLODipine  10 mg Oral Daily   aspirin EC  81 mg Oral  Daily   atorvastatin  80 mg Oral QPM   dexamethasone (DECADRON) injection  6 mg Intravenous Q24H   heparin injection (subcutaneous)  5,000 Units Subcutaneous Q8H   influenza vac split quadrivalent PF  0.5 mL Intramuscular Tomorrow-1000   insulin aspart  0-20 Units Subcutaneous TID WC   insulin aspart  0-5 Units Subcutaneous QHS   insulin aspart  3 Units Subcutaneous TID WC   insulin detemir  25 Units Subcutaneous BID   sevelamer carbonate  800 mg Oral TID WC      Gean Quint, MD [facility] 04/22/2021, 10:22 AM

## 2021-04-22 NOTE — Progress Notes (Addendum)
PROGRESS NOTE                                                                                                                                                                                                             Patient Demographics:    [name], is a 65 y.o. female, DOB - 02-24-1956, QQP:[id number]  Outpatient Primary MD for the patient is [facility] [place] date - 04/20/2021    Chief Complaint  Patient presents with   Shortness of Breath       Brief Narrative (HPI from H&P)  [name] is a 65 y.o. female with medical history significant for end-stage renal disease on hemodialysis on Tuesday, Thursday, Saturday schedule, type 2 diabetes mellitus, chronic diastolic heart failure, hyperlipidemia, hypertension, presenting from home to [facility] ED complaining of shortness of breath, missed last HD treatment 2 days ago, now in CHF with incidental Covid infection.   Subjective:   Patient in bed, appears comfortable, denies any headache, no fever, no chest pain or pressure, no shortness of breath , no abdominal pain. No new focal weakness.    Assessment  & Plan :     Acute Hypoxic Resp. Failure due to Acute on chronic diastolic CHF EF 71% echocardiogram in 2020 with incidental COVID-19 infection - this is likely due to missed HD treatment, Renal called, clinically no Sepsis, since steroids and Remdesivir started, short course , much better after HD on 04/21/21, monitor.  Encouraged the patient to sit up in chair in the daytime use I-S and flutter valve for pulmonary toiletry.  Will advance activity and titrate down oxygen as possible.  2. ESRD - TTS, missed last Rx, counseled, Renal following.  3. Acute Metabolic Encephalopathy  - due to #1 and missed HD, CT head stable, no F.N deficits, hold Lyrica, HD, o2. Resolved.  4. Obesity - BMI 40, follow with PCP.  5. Dyslipidemia - statin.  6. GERD   - PPI upon DC.  7. HTN - added Norvasc + PRN Hydralazine.  8 DM 2 - CBGs high due to Steroids, placed on Lantus, Premeal Novolog + ISS, adjust dose.  Lab Results  Component Value Date   HGBA1C 12.8 (H) 04/21/2021    CBG (last 3)  Recent Labs    04/21/21 2013 04/21/21 2348  04/22/21 0754  GLUCAP 528* 458* 389*         Condition - Extremely Guarded  Family Communication  :  daughter [phone] message left on 04/21/2016 at 10:57 AM   Code Status :  Full  Consults  :  Renal  PUD Prophylaxis :    Procedures  :     CT Abd Pelvis - CT Head  - Non acute      Disposition Plan  :    Status is: Inpatient  Remains inpatient appropriate because:IV treatments appropriate due to intensity of illness or inability to take PO  Dispo: The patient is from: Home              Anticipated d/c is to: Home              Patient currently is not medically stable to d/c.   Difficult to place patient No   DVT Prophylaxis  :    heparin injection 5,000 Units Start: 04/21/21 1400 SCDs Start: 04/20/21 2333  Heparin    Lab Results  Component Value Date   PLT 127 (L) 04/21/2021    Diet :  Diet Order             Diet renal with fluid restriction Fluid restriction: 2000 mL Fluid; Room service appropriate? Yes; Fluid consistency: Thin  Diet effective now                    Inpatient Medications  Scheduled Meds:  amLODipine  10 mg Oral Daily   aspirin EC  81 mg Oral Daily   atorvastatin  80 mg Oral QPM   dexamethasone (DECADRON) injection  6 mg Intravenous Q24H   heparin injection (subcutaneous)  5,000 Units Subcutaneous Q8H   influenza vac split quadrivalent PF  0.5 mL Intramuscular Tomorrow-1000   insulin aspart  0-20 Units Subcutaneous TID WC   insulin aspart  0-5 Units Subcutaneous QHS   insulin aspart  3 Units Subcutaneous TID WC   insulin detemir  25 Units Subcutaneous BID   sevelamer carbonate  800 mg Oral TID WC   Continuous Infusions:  sodium chloride      sodium chloride     remdesivir 100 mg in NS 100 mL Stopped (04/21/21 1302)   PRN Meds:.sodium chloride, sodium chloride, acetaminophen **OR** acetaminophen, albuterol, alteplase, heparin, hydrALAZINE, lidocaine (PF), lidocaine-prilocaine, pentafluoroprop-tetrafluoroeth  Antibiotics  :    Anti-infectives (From admission, onward)    Start     Dose/Rate Route Frequency Ordered Stop   04/21/21 1000  remdesivir 100 mg in sodium chloride 0.9 % 100 mL IVPB       See Hyperspace for full Linked Orders Report.   100 mg 200 mL/hr over 30 Minutes Intravenous Daily 04/20/21 2237 04/25/21 0959   04/20/21 2245  remdesivir 200 mg in sodium chloride 0.9% 250 mL IVPB       See Hyperspace for full Linked Orders Report.   200 mg 580 mL/hr over 30 Minutes Intravenous Once 04/20/21 2237 04/21/21 0006        Time Spent in minutes  30   Lala Lund M.D on 04/22/2021 at 10:55 AM  To page go to [email or web address]   [facility] -  Office  [phone]  See all Orders from today for further details    Objective:   Vitals:   04/22/21 1000 04/22/21 1015 04/22/21 1033 04/22/21 1045  BP:  129/62 118/63 112/63  Pulse: 68 67 68 68  Resp: 16 18 11  12  Temp:  (!) 97.3 F (36.3 C)    TempSrc:  Oral    SpO2: 97% 94% 95% 98%  Weight:  122.9 kg    Height:        Wt Readings from Last 3 Encounters:  04/22/21 122.9 kg  03/24/21 125.2 kg  11/19/20 126.6 kg     Intake/Output Summary (Last 24 hours) at 04/22/2021 1055 Last data filed at 04/21/2021 1302 Gross per 24 hour  Intake 98.93 ml  Output --  Net 98.93 ml     Physical Exam  Awake Alert, No new F.N deficits, Normal affect [place].AT,PERRAL Supple Neck, No JVD,   Symmetrical Chest wall movement, Good air movement bilaterally, CTAB RRR,No Gallops, Rubs or new Murmurs,   +ve B.Sounds, Abd Soft, No tenderness,   No Cyanosis, Clubbing or edema,      Data Review:    CBC Recent Labs  Lab 04/20/21 2030 04/20/21 2047  04/21/21 0447 04/21/21 0452 04/21/21 0456  WBC 5.4  --  4.7  --   --   HGB 12.8 18.7* 9.5* 9.9* 10.2*  HCT 40.4 55.0* 30.5* 29.0* 30.0*  PLT 106*  --  127*  --   --   MCV 97.3  --  100.0  --   --   MCH 30.8  --  31.1  --   --   MCHC 31.7  --  31.1  --   --   RDW 14.2  --  14.0  --   --   LYMPHSABS 0.9  --  0.4*  --   --   MONOABS 0.5  --  0.2  --   --   EOSABS 0.0  --  0.0  --   --   BASOSABS 0.0  --  0.0  --   --     Recent Labs  Lab 04/20/21 2030 04/20/21 2047 04/21/21 0447 04/21/21 0452 04/21/21 0456 04/21/21 1937  NA 134* 134* 133* 131* 131*  --   K 4.8 4.8 5.2* 5.0 5.2*  --   CL 92* 95* 93*  --   --   --   CO2 25  --  22  --   --   --   GLUCOSE 191* 188* 407*  --   --   --   BUN 49* 72* 55*  --   --   --   CREATININE 10.60* 11.00* 11.09*  --   --   --   CALCIUM 7.8*  --  7.7*  --   --   --   AST 44*  --  41  --   --   --   ALT 26  --  30  --   --   --   ALKPHOS 153*  --  145*  --   --   --   BILITOT 1.1  --  1.2  --   --   --   ALBUMIN 2.8*  --  2.6*  --   --   --   MG  --   --  2.0  --   --   --   CRP  --   --  8.1*  --   --   --   DDIMER  --   --  0.65*  --   --   --   PROCALCITON  --   --  1.17  --   --   --   LATICACIDVEN 1.4  --   --   --   --   --  TSH  --   --  0.715  --   --   --   HGBA1C  --   --   --   --   --  12.8*  BNP  --   --  107.2*  --   --   --     ------------------------------------------------------------------------------------------------------------------ No results for input(s): CHOL, HDL, LDLCALC, TRIG, CHOLHDL, LDLDIRECT in the last 72 hours.  Lab Results  Component Value Date   HGBA1C 12.8 (H) 04/21/2021   ------------------------------------------------------------------------------------------------------------------ Recent Labs    04/21/21 0447  TSH 0.715    Cardiac Enzymes No results for input(s): CKMB, TROPONINI, MYOGLOBIN in the last 168 hours.  Invalid input(s):  CK ------------------------------------------------------------------------------------------------------------------    Component Value Date/Time   BNP 107.2 (H) 04/21/2021 0447   BNP 137.2 (H) 09/23/2016 1451    Radiology Reports CT ABDOMEN PELVIS WO CONTRAST  Result Date: 04/20/2021 CLINICAL DATA:  Short of breath, lethargy, abdominal distension EXAM: CT ABDOMEN AND PELVIS WITHOUT CONTRAST TECHNIQUE: Multidetector CT imaging of the abdomen and pelvis was performed following the standard protocol without IV contrast. COMPARISON:  01/01/2021 FINDINGS: Lower chest: Hypoventilatory changes at the lung bases. No acute airspace disease or effusion. Hepatobiliary: Cholecystectomy. Unenhanced imaging of the liver is unremarkable. Pancreas: Unremarkable. No pancreatic ductal dilatation or surrounding inflammatory changes. Spleen: Normal in size without focal abnormality. Adrenals/Urinary Tract: Stable left renal cortical cyst. No urinary tract calculi or obstructive uropathy. Bladder is unremarkable. Adrenals are normal. Stomach/Bowel: No bowel obstruction or ileus. Postsurgical changes are seen from diverting colostomy left mid abdomen. No bowel wall thickening or inflammatory change. Vascular/Lymphatic: Aortic atherosclerosis. No enlarged abdominal or pelvic lymph nodes. Reproductive: Uterus and bilateral adnexa are unremarkable. Other: No free fluid or free gas. Musculoskeletal: No acute or destructive bony lesions. Stable postsurgical changes right inguinal region. Reconstructed images demonstrate no additional findings. IMPRESSION: 1. No acute intra-abdominal or intrapelvic process. 2. Stable postsurgical changes from diverting colostomy. No bowel obstruction or ileus. 3.  Aortic Atherosclerosis (ICD10-I70.0). Electronically Signed   By: Randa Ngo M.D.   On: 04/20/2021 23:07   CT HEAD WO CONTRAST (5MM)  Result Date: 04/20/2021 CLINICAL DATA:  Mental status change, unknown cause EXAM: CT HEAD  WITHOUT CONTRAST TECHNIQUE: Contiguous axial images were obtained from the base of the skull through the vertex without intravenous contrast. COMPARISON:  09/26/2015 FINDINGS: Brain: Diffuse cerebral atrophy. No acute intracranial abnormality. Specifically, no hemorrhage, hydrocephalus, mass lesion, acute infarction, or significant intracranial injury. Vascular: No hyperdense vessel or unexpected calcification. Skull: No acute calvarial abnormality. Sinuses/Orbits: No acute findings Other: None IMPRESSION: Cerebral atrophy.  No acute intracranial abnormality. Electronically Signed   By: Rolm Baptise M.D.   On: 04/20/2021 23:04   DG Chest Port 1 View  Result Date: 04/21/2021 CLINICAL DATA:  65 year old female positive COVID-19. Shortness of breath. Right side chest pain. EXAM: PORTABLE CHEST 1 VIEW COMPARISON:  Portable chest 04/20/2021 and earlier. FINDINGS: Portable AP semi upright view at 0733 hours. Lower lung volumes. Stable cardiomegaly and mediastinal contours. Visualized tracheal air column is within normal limits. No pneumothorax, pulmonary edema or pleural effusion. Streaky bibasilar pulmonary opacity appears new or increased from yesterday. No consolidation. Paucity of bowel gas. No acute osseous abnormality identified. IMPRESSION: Lower lung volumes since yesterday with increased bibasilar atelectasis versus patchy COVID-19 pneumonia. No pleural effusion. Electronically Signed   By: Genevie Ann M.D.   On: 04/21/2021 07:55   DG Chest Port 1 View  Result Date: 04/20/2021 CLINICAL DATA:  Short of breath, hypoxic, missed dialysis EXAM: PORTABLE CHEST 1 VIEW COMPARISON:  03/05/2021 FINDINGS: Single frontal view of the chest demonstrates stable enlargement of the cardiac silhouette. Chronic vascular congestion without airspace disease, effusion, or pneumothorax. No acute bony abnormalities. IMPRESSION: 1. Chronic vascular congestion without overt edema. Electronically Signed   By: Randa Ngo M.D.   On:  04/20/2021 20:44   DG Knee Complete 4 Views Left  Result Date: 03/24/2021 CLINICAL DATA:  Pain and decreased mobility. EXAM: LEFT KNEE - COMPLETE 4+ VIEW COMPARISON:  01/31/2020 FINDINGS: Small knee joint effusion. Medial compartment osteoarthritis with joint space narrowing and marginal osteophytes. Some degenerative change also at the patellofemoral joint. Regional arterial calcification is noted. IMPRESSION: Medial compartment and patellofemoral compartment osteoarthritis. Small joint effusion. Electronically Signed   By: Nelson Chimes M.D.   On: 03/24/2021 14:38   VAS [place] LOWER EXTREMITY VENOUS (DVT) (MC and WL 7a-7p)  Result Date: 03/24/2021  Lower Venous DVT Study Patient Name:  [name]  Date of Exam:   03/24/2021 Medical Rec #: [id number]      Accession #:    [id number] Date of Birth: October 14, 1955      Patient Gender: F Patient Age:   52 years Exam Location:  [facility] Procedure:      VAS [place] LOWER EXTREMITY VENOUS (DVT) Referring Phys: SOPHIA CACCAVALE --------------------------------------------------------------------------------  Indications: Pain.  Limitations: Suboptimal due to patient positioning/scanning conditions as well as body habitus/patient pain tolerance. Comparison Study: 05-16-2020 Most recent prior study- right lower extremity                   venous was negative for DVT. Performing Technologist: Darlin Coco RDMS, RVT  Examination Guidelines: A complete evaluation includes B-mode imaging, spectral Doppler, color Doppler, and power Doppler as needed of all accessible portions of each vessel. Bilateral testing is considered an integral part of a complete examination. Limited examinations for reoccurring indications may be performed as noted. The reflux portion of the exam is performed with the patient in reverse Trendelenburg.  +---------+---------------+---------+-----------+----------+-------------------+ RIGHT    CompressibilityPhasicitySpontaneityPropertiesThrombus  Aging      +---------+---------------+---------+-----------+----------+-------------------+ CFV      Full           Yes      Yes                                      +---------+---------------+---------+-----------+----------+-------------------+ SFJ      Full                                                             +---------+---------------+---------+-----------+----------+-------------------+ FV Prox  Full                                                             +---------+---------------+---------+-----------+----------+-------------------+ FV Mid   Full                                                             +---------+---------------+---------+-----------+----------+-------------------+  FV DistalFull                                                             +---------+---------------+---------+-----------+----------+-------------------+ PFV      Full                                                             +---------+---------------+---------+-----------+----------+-------------------+ POP      Full           Yes      Yes                                      +---------+---------------+---------+-----------+----------+-------------------+ PTV                     Yes      Yes                                      +---------+---------------+---------+-----------+----------+-------------------+ PERO                                                  Not well visualized +---------+---------------+---------+-----------+----------+-------------------+   +---------+---------------+---------+-----------+----------+-------------------+ LEFT     CompressibilityPhasicitySpontaneityPropertiesThrombus Aging      +---------+---------------+---------+-----------+----------+-------------------+ CFV      Full           Yes      Yes                                       +---------+---------------+---------+-----------+----------+-------------------+ SFJ      Full                                                             +---------+---------------+---------+-----------+----------+-------------------+ FV Prox  Full                                                             +---------+---------------+---------+-----------+----------+-------------------+ FV Mid   Full                                                             +---------+---------------+---------+-----------+----------+-------------------+ FV Distal  Yes      Yes                                      +---------+---------------+---------+-----------+----------+-------------------+ PFV      Full                                                             +---------+---------------+---------+-----------+----------+-------------------+ POP      Full           Yes      Yes                                      +---------+---------------+---------+-----------+----------+-------------------+ PTV                     Yes      Yes                                      +---------+---------------+---------+-----------+----------+-------------------+ PERO                                                  Not well visualized +---------+---------------+---------+-----------+----------+-------------------+     Summary: RIGHT: - There is no evidence of deep vein thrombosis in the lower extremity. However, portions of this examination were limited- see technologist comments above.  - No cystic structure found in the popliteal fossa.  LEFT: - There is no evidence of deep vein thrombosis in the lower extremity. However, portions of this examination were limited- see technologist comments above.  - No cystic structure found in the popliteal fossa.  *See table(s) above for measurements and observations. Electronically signed by Servando Snare MD on 03/24/2021 at 11:00:17 PM.     Final

## 2021-04-23 ENCOUNTER — Inpatient Hospital Stay (HOSPITAL_COMMUNITY): Payer: Medicare Other

## 2021-04-23 DIAGNOSIS — M79604 Pain in right leg: Secondary | ICD-10-CM

## 2021-04-23 DIAGNOSIS — M79605 Pain in left leg: Secondary | ICD-10-CM | POA: Diagnosis not present

## 2021-04-23 DIAGNOSIS — M7989 Other specified soft tissue disorders: Secondary | ICD-10-CM

## 2021-04-23 LAB — CBC WITH DIFFERENTIAL/PLATELET
Abs Immature Granulocytes: 0.16 10*3/uL — ABNORMAL HIGH (ref 0.00–0.07)
Basophils Absolute: 0 10*3/uL (ref 0.0–0.1)
Basophils Relative: 0 %
Eosinophils Absolute: 0 10*3/uL (ref 0.0–0.5)
Eosinophils Relative: 0 %
HCT: 33.2 % — ABNORMAL LOW (ref 36.0–46.0)
Hemoglobin: 11.1 g/dL — ABNORMAL LOW (ref 12.0–15.0)
Immature Granulocytes: 3 %
Lymphocytes Relative: 17 %
Lymphs Abs: 1 10*3/uL (ref 0.7–4.0)
MCH: 31.2 pg (ref 26.0–34.0)
MCHC: 33.4 g/dL (ref 30.0–36.0)
MCV: 93.3 fL (ref 80.0–100.0)
Monocytes Absolute: 0.6 10*3/uL (ref 0.1–1.0)
Monocytes Relative: 10 %
Neutro Abs: 4 10*3/uL (ref 1.7–7.7)
Neutrophils Relative %: 70 %
Platelets: 244 10*3/uL (ref 150–400)
RBC: 3.56 MIL/uL — ABNORMAL LOW (ref 3.87–5.11)
RDW: 13.6 % (ref 11.5–15.5)
WBC: 5.7 10*3/uL (ref 4.0–10.5)
nRBC: 0 % (ref 0.0–0.2)

## 2021-04-23 LAB — BRAIN NATRIURETIC PEPTIDE: B Natriuretic Peptide: 115.5 pg/mL — ABNORMAL HIGH (ref 0.0–100.0)

## 2021-04-23 LAB — COMPREHENSIVE METABOLIC PANEL
ALT: 23 U/L (ref 0–44)
AST: 22 U/L (ref 15–41)
Albumin: 2.6 g/dL — ABNORMAL LOW (ref 3.5–5.0)
Alkaline Phosphatase: 139 U/L — ABNORMAL HIGH (ref 38–126)
Anion gap: 16 — ABNORMAL HIGH (ref 5–15)
BUN: 47 mg/dL — ABNORMAL HIGH (ref 8–23)
CO2: 23 mmol/L (ref 22–32)
Calcium: 8 mg/dL — ABNORMAL LOW (ref 8.9–10.3)
Chloride: 96 mmol/L — ABNORMAL LOW (ref 98–111)
Creatinine, Ser: 6.84 mg/dL — ABNORMAL HIGH (ref 0.44–1.00)
GFR, Estimated: 6 mL/min — ABNORMAL LOW (ref >60)
Glucose, Bld: 189 mg/dL — ABNORMAL HIGH (ref 70–99)
Potassium: 4 mmol/L (ref 3.5–5.1)
Sodium: 135 mmol/L (ref 135–145)
Total Bilirubin: 0.3 mg/dL (ref 0.3–1.2)
Total Protein: 6.6 g/dL (ref 6.5–8.1)

## 2021-04-23 LAB — C-REACTIVE PROTEIN: CRP: 5.5 mg/dL — ABNORMAL HIGH (ref <1.0)

## 2021-04-23 LAB — D-DIMER, QUANTITATIVE: D-Dimer, Quant: 0.8 ug/mL-FEU — ABNORMAL HIGH (ref 0.00–0.50)

## 2021-04-23 LAB — MAGNESIUM: Magnesium: 2.1 mg/dL (ref 1.7–2.4)

## 2021-04-23 LAB — METHYLMALONIC ACID, SERUM: Methylmalonic Acid, Quantitative: 601 nmol/L — ABNORMAL HIGH (ref 0–378)

## 2021-04-23 LAB — HEPATITIS B SURFACE ANTIBODY, QUANTITATIVE: Hep B S AB Quant (Post): 280.3 m[IU]/mL (ref >9.9)

## 2021-04-23 LAB — GLUCOSE, CAPILLARY
Glucose-Capillary: 120 mg/dL — ABNORMAL HIGH (ref 70–99)
Glucose-Capillary: 126 mg/dL — ABNORMAL HIGH (ref 70–99)

## 2021-04-23 MED ORDER — OXYCODONE-ACETAMINOPHEN 5-325 MG PO TABS: 1.0000 | ORAL_TABLET | Freq: Three times a day (TID) | ORAL | 0 refills | Status: DC | PRN

## 2021-04-23 NOTE — Progress Notes (Signed)
[place] Lower Extremity was completed at bedside. Per [place] tech, the results are negative for DVT. Dr. Candiss Norse notified of completion due to pending discharge. Attending cleared pt to be discharged per negative result of ultrasound DVT.

## 2021-04-23 NOTE — Discharge Instructions (Signed)
Follow with Primary MD [facility] [place] in 7 days   Get CBC, CMP, 2 view Chest X [place] -  checked next visit within 1 week by Primary MD    Activity: As tolerated with Full fall precautions use walker/cane & assistance as needed  Disposition Home   Diet: Renal, 1.5 lit/day fluid restriction per day  Special Instructions: If you have smoked or chewed Tobacco  in the last 2 yrs please stop smoking, stop any regular Alcohol  and or any Recreational drug use.  On your next visit with your primary care physician please Get Medicines reviewed and adjusted.  Please request your Prim.MD to go over all Hospital Tests and Procedure/Radiological results at the follow up, please get all Hospital records sent to your Prim MD by signing hospital release before you go home.  If you experience worsening of your admission symptoms, develop shortness of breath, life threatening emergency, suicidal or homicidal thoughts you must seek medical attention immediately by calling 911 or calling your MD immediately  if symptoms less severe.  You Must read complete instructions/literature along with all the possible adverse reactions/side effects for all the Medicines you take and that have been prescribed to you. Take any new Medicines after you have completely understood and accpet all the possible adverse reactions/side effects.

## 2021-04-23 NOTE — Progress Notes (Signed)
[facility] Progress Note    Assessment/ Plan:   Covid-19 infection - Remdesivir, dexamethasone per primary  ESRD -  HD on usual TTS sched Hypertension/volume  - BP improved with meds/HD.  Continue home meds. Post HD wt 119.8 kg. Below outpatient dry weight -will lower at discharge.  Anemia  - Hgb 11.1 On ESA as outpatient. Follow trends.  Metabolic bone disease -  Corr Ca ok. Continue home binders for now.  Nutrition - Renal diet with fluid restriction. Added prot supp for low albumin  S/p diverting colostomy   Dialysis Orders:  AF TTS 4h 450/500 EDW 123 kg 2K/2Ca AVF No heparin -Mircera 60 q 2 weeks (last 10/6)  -Venofer 50 q wk  -Hectorol 10 TIW, Sensipar 30 TIW   Subjective:   Completed dialysis net UF 4L  Seen in room. Feeling much better. On RA. For discharge today.    Objective:   BP 133/66 (BP Location: Right Arm)   Pulse 68   Temp 98 F (36.7 C) (Oral)   Resp 19   Ht 5\' 8"  (1.727 m)   Wt 119.8 kg   SpO2 96%   BMI 40.16 kg/m   Intake/Output Summary (Last 24 hours) at 04/23/2021 0814 Last data filed at 04/23/2021 0600 Gross per 24 hour  Intake 100 ml  Output 4800 ml  Net -4700 ml    Weight change: -1.6 kg  Physical Exam: Not examined today in the context of COVID positive, limiting exposure, limiting PPE use.  Imaging: No results found.  Labs: BMET Recent Labs  Lab 04/20/21 2030 04/20/21 2047 04/21/21 0447 04/21/21 0452 04/21/21 0456 04/22/21 1039 04/23/21 0149  NA 134* 134* 133* 131* 131* 130* 135  K 4.8 4.8 5.2* 5.0 5.2* 4.3 4.0  CL 92* 95* 93*  --   --  91* 96*  CO2 25  --  22  --   --  22 23  GLUCOSE 191* 188* 407*  --   --  387* 189*  BUN 49* 72* 55*  --   --  83* 47*  CREATININE 10.60* 11.00* 11.09*  --   --  12.15* 6.84*  CALCIUM 7.8*  --  7.7*  --   --  7.6* 8.0*  PHOS  --   --  8.9*  --   --   --   --     CBC Recent Labs  Lab 04/20/21 2030 04/20/21 2047 04/21/21 0447 04/21/21 0452 04/21/21 0456  04/22/21 1039 04/23/21 0600  WBC 5.4  --  4.7  --   --  3.9* 5.7  NEUTROABS 3.9  --  4.1  --   --  3.2 4.0  HGB 12.8   < > 9.5* 9.9* 10.2* 9.3* 11.1*  HCT 40.4   < > 30.5* 29.0* 30.0* 28.0* 33.2*  MCV 97.3  --  100.0  --   --  93.3 93.3  PLT 106*  --  127*  --   --  173 244   < > = values in this interval not displayed.     Medications:     amLODipine  10 mg Oral Daily   aspirin EC  81 mg Oral Daily   atorvastatin  80 mg Oral QPM   dexamethasone (DECADRON) injection  2 mg Intravenous Q24H   heparin injection (subcutaneous)  5,000 Units Subcutaneous Q8H   influenza vac split quadrivalent PF  0.5 mL Intramuscular Tomorrow-1000   insulin aspart  0-20 Units Subcutaneous TID WC   insulin  aspart  0-5 Units Subcutaneous QHS   insulin aspart  6 Units Subcutaneous TID WC   insulin detemir  30 Units Subcutaneous BID   sevelamer carbonate  800 mg Oral TID WC     Lynnda Child PA-C [facility] 04/23/2021,10:00 AM

## 2021-04-23 NOTE — Discharge Summary (Signed)
[name] IHW:[id number] DOB: January 16, 1956 DOA: 04/20/2021  PCP: [facility]  Admit date: 04/20/2021  Discharge date: 04/23/2021  Admitted From: Home   Disposition:  Home   Recommendations for Outpatient Follow-up:   Follow up with PCP in 1-2 weeks  PCP Please obtain BMP/CBC, 2 view CXR in 1week,  (see Discharge instructions)   PCP Please follow up on the following pending results: Needs outpatient orthopedics follow-up for ongoing bilateral calf discomfort and pain with negative venous duplex times daily.   Home Health: None Equipment/Devices: None  Consultations: Renal Discharge Condition: Stable    CODE STATUS: Full    Diet Recommendation: Heart Healthy low carbohydrate with 1.5 L/day total fluid restriction  Chief Complaint  Patient presents with   Shortness of Breath     Brief history of present illness from the day of admission and additional interim summary    [name] is a 65 y.o. female with medical history significant for end-stage renal disease on hemodialysis on Tuesday, Thursday, Saturday schedule, type 2 diabetes mellitus, chronic diastolic heart failure, hyperlipidemia, hypertension, presenting from home to [facility] ED complaining of shortness of breath, missed last HD treatment 2 days ago, now in CHF with incidental Covid infection.                                                                   Hospital Course    Acute Hypoxic Resp. Failure due to Acute on chronic diastolic CHF EF 49% echocardiogram in 2020 with incidental COVID-19 infection - this is likely due to missed HD treatment, Renal called, clinically no Sepsis, since steroids and Remdesivir  werestarted, short course x 3 doses done , much better after HD on 04/21/21, now at baseline, counseled on HD complaince.     2. ESRD - TTS, missed last Rx, counseled, Renal following.   3. Acute Metabolic Encephalopathy  - due to #1 and missed HD, CT head stable, no F.N deficits, hold Lyrica, HD, o2. Resolved.   4. Obesity - BMI 40, follow with PCP.   5. Dyslipidemia - statin.   6. GERD  - PPI upon DC.   7. HTN -continue home regimen.   8. Ongoing bilateral calf pain x 3 mths - Leg Venous US -ve x 2, outpt Ortho follow up per PCP    9. DM 2 - home Rx.  Follow-up with PCP, poor outpatient glycemic control due to hyperglycemia.  Lab Results  Component Value Date   HGBA1C 12.8 (H) 04/21/2021       Discharge diagnosis     Principal Problem:   ZPHXT-05 virus infection Active Problems:   Uncontrolled type 2 diabetes mellitus with hyperosmolar nonketotic hyperglycemia (HCC)   Shortness of breath   Essential hypertension   GERD (gastroesophageal reflux disease)  Acute metabolic encephalopathy   ESRD (end stage renal disease) on dialysis ([place])   Acute respiratory failure with hypoxia (HCC)   Severe sepsis [facility])    Discharge instructions    Discharge Instructions     Diet - low sodium heart healthy   Complete by: As directed    Discharge instructions   Complete by: As directed    Follow with Primary MD [facility] in 7 days   Get CBC, CMP, 2 view Chest X Hail -  checked next visit within 1 week by Primary MD    Activity: As tolerated with Full fall precautions use walker/cane & assistance as needed  Disposition Home   Diet: Renal,Low Carb -  1.5 lit/day fluid restriction per day  Accuchecks 4 times/day, Once in AM empty stomach and then before each meal. Log in all results and show them to your Prim.MD in 3 days. If any glucose reading is under 80 or above 300 call your Prim MD immidiately. Follow Low glucose instructions for glucose under 80 as instructed.   Special Instructions: If you have smoked or chewed Tobacco  in the last 2 yrs please stop smoking, stop any  regular Alcohol  and or any Recreational drug use.  On your next visit with your primary care physician please Get Medicines reviewed and adjusted.  Please request your Prim.MD to go over all Hospital Tests and Procedure/Radiological results at the follow up, please get all Hospital records sent to your Prim MD by signing hospital release before you go home.  If you experience worsening of your admission symptoms, develop shortness of breath, life threatening emergency, suicidal or homicidal thoughts you must seek medical attention immediately by calling 911 or calling your MD immediately  if symptoms less severe.  You Must read complete instructions/literature along with all the possible adverse reactions/side effects for all the Medicines you take and that have been prescribed to you. Take any new Medicines after you have completely understood and accpet all the possible adverse reactions/side effects.   Increase activity slowly   Complete by: As directed    MyChart COVID-19 home monitoring program   Complete by: Apr 23, 2021    Is the patient willing to use the [place] for home monitoring?: Yes   Temperature monitoring   Complete by: Apr 23, 2021    After how many days would you like to receive a notification of this patient's flowsheet entries?: 1       Discharge Medications   Allergies as of 04/23/2021       Reactions   Morphine Itching   Tolerated hydromorphone on 07/2020   Shellfish Allergy Anaphylaxis, Swelling   Diazepam Other (See Comments)   "felt like out of body experience"        Medication List     TAKE these medications    albuterol 108 (90 Base) MCG/ACT inhaler Commonly known as: VENTOLIN HFA Inhale 2 puffs into the lungs every 4 (four) hours as needed for wheezing or shortness of breath.   allopurinol 100 MG tablet Commonly known as: ZYLOPRIM TAKE 1 TABLET BY MOUTH ONCE DAILY.   aspirin 81 MG EC tablet Take 1 tablet (81 mg total) by mouth  daily.   atorvastatin 80 MG tablet Commonly known as: LIPITOR TAKE 1 TABLET BY MOUTH ONCE DAILY AT 6PM What changed:  how much to take how to take this when to take this additional instructions   Auryxia 1 GM 210 MG(Fe) tablet Generic  drug: ferric citrate Take 2 tablets (420 mg total) by mouth 3 (three) times daily with meals.   buPROPion 150 MG 12 hr tablet Commonly known as: Wellbutrin SR Take 1 tablet (150 mg total) by mouth 2 (two) times daily.   carvedilol 25 MG tablet Commonly known as: COREG Take 1 tablet (25 mg total) by mouth 2 (two) times daily with a meal.   colchicine 0.6 MG tablet Take 1 tablet (0.6 mg total) by mouth once a week for 15 days.   cyclobenzaprine 10 MG tablet Commonly known as: FLEXERIL Take 1 tablet (10 mg total) by mouth 2 (two) times daily as needed for muscle spasms.   diclofenac Sodium 1 % Gel Commonly known as: Voltaren Apply 2 g topically 4 (four) times daily.   dicyclomine 20 MG tablet Commonly known as: BENTYL Take 1 tablet (20 mg total) by mouth in the morning and at bedtime.   docusate sodium 100 MG capsule Commonly known as: COLACE Take 1 capsule (100 mg total) by mouth 2 (two) times daily as needed for mild constipation.   doxepin 10 MG capsule Commonly known as: SINEQUAN TAKE 1 CAPSULE(10 MG) BY MOUTH FOUR TIMES DAILY AS NEEDED FOR ITCHING What changed:  how much to take how to take this when to take this additional instructions   feeding supplement (NEPRO CARB STEADY) Liqd Take 237 mLs by mouth daily.   fluticasone 50 MCG/ACT nasal spray Commonly known as: FLONASE Place 2 sprays into both nostrils daily as needed for allergies or rhinitis.   fluticasone furoate-vilanterol 200-25 MCG/INH Aepb Commonly known as: BREO ELLIPTA Inhale 1 puff into the lungs daily.   FreeStyle Libre 14 Day [name] Kerrin Mo To check blood sugar ACHS   FreeStyle Libre 14 Day Sensor Misc To check blood sugar ACHS; change every 14 days    hydrALAZINE 100 MG tablet Commonly known as: APRESOLINE Take 100 mg by mouth 3 (three) times daily.   hydrOXYzine 25 MG tablet Commonly known as: ATARAX/VISTARIL Take 1 tablet (25 mg total) by mouth 4 (four) times daily.   insulin glargine 100 UNIT/ML Solostar Pen Commonly known as: LANTUS 25 iu sq qd and increase by 2 iu for every 3 day average greater than 150   insulin lispro 100 UNIT/ML KwikPen Commonly known as: HumaLOG KwikPen Inject 25 Units into the skin with breakfast, with lunch, and with evening meal. What changed:  how much to take when to take this   Levemir FlexTouch 100 UNIT/ML FlexPen Generic drug: insulin detemir Inject 60 Units into the skin 2 (two) times daily.   lidocaine-prilocaine cream Commonly known as: EMLA Apply 1 application topically as needed. What changed: reasons to take this   loratadine 10 MG tablet Commonly known as: CLARITIN TAKE 1 TABLET BY MOUTH ONCE DAILY. What changed:  when to take this reasons to take this   montelukast 10 MG tablet Commonly known as: SINGULAIR TAKE ONE TABLET BY MOUTH AT BEDTIME. What changed:  when to take this reasons to take this   nitroGLYCERIN 0.4 MG SL tablet Commonly known as: NITROSTAT Place 1 tablet (0.4 mg total) under the tongue every 5 (five) minutes x 3 doses as needed for chest pain.   nystatin cream Commonly known as: MYCOSTATIN Apply 1 application topically 2 (two) times daily. What changed:  when to take this reasons to take this   omeprazole 20 MG capsule Commonly known as: PRILOSEC Take 20 mg by mouth daily.   oxyCODONE 5 MG immediate release tablet  Commonly known as: Roxicodone Take 1 tablet (5 mg total) by mouth every 12 (twelve) hours as needed for severe pain.   oxyCODONE-acetaminophen 5-325 MG tablet Commonly known as: PERCOCET/ROXICET Take 1 tablet by mouth every 8 (eight) hours as needed. What changed:  when to take this reasons to take this   pantoprazole 40 MG  tablet Commonly known as: PROTONIX Take 1 tablet (40 mg total) by mouth 2 (two) times daily.   pregabalin 150 MG capsule Commonly known as: LYRICA Take 1 capsule (150 mg total) by mouth 2 (two) times daily.   RENA-VITE RX PO Take 1 mg by mouth daily.   Rena-Vite Rx 1 MG Tabs Take 1 mg by mouth daily.   Senna-Plus 8.6-50 MG tablet Generic drug: senna-docusate TAKE ONE TABLET BY MOUTH AT BEDTIME.   sevelamer carbonate 800 MG tablet Commonly known as: RENVELA Take 800 mg by mouth 3 (three) times daily.   sucralfate 1 g tablet Commonly known as: CARAFATE Take 1 tablet (1 g total) by mouth 2 (two) times daily for 26 days.   torsemide 20 MG tablet Commonly known as: DEMADEX TAKE (2) TABLETS BY MOUTH TWICE DAILY.   triamcinolone cream 0.1 % Commonly known as: KENALOG APPLY TO AFFECTED AREA TWICE DAILY What changed: See the new instructions.   UltiCare Short Pen Needles 31G X 8 MM Misc Generic drug: Insulin Pen Needle USE THREE TIMES DAILY WITH INSULIN   valACYclovir 500 MG tablet Commonly known as: VALTREX TAKE (1) TABLET BY MOUTH EVERY OTHER DAY.   Victoza 18 MG/3ML Sopn Generic drug: liraglutide Start with 0.6mg  daily and increase by 0.6mg  per week until max dose of 1.8 mg dose achieved.   Vitamin D (Ergocalciferol) 1.25 MG (50000 UNIT) Caps capsule Commonly known as: DRISDOL TAKE 1 CAPSULE BY MOUTH ONCE A [place]. Schedule an appointment as soon as possible for a visit in 1 week(s).   Contact information: [place] [address] [phone]         Sherren Mocha, MD. Schedule an appointment as soon as possible for a visit in 1 week(s).   Specialty: Cardiology Contact information: [address] [place] [address] [place] [address] [phone]                 Major procedures and Radiology Reports - PLEASE review detailed and final reports thoroughly  -        CT ABDOMEN PELVIS WO CONTRAST  Result Date: 04/20/2021 CLINICAL DATA:  Short of breath, lethargy, abdominal distension EXAM: CT ABDOMEN AND PELVIS WITHOUT CONTRAST TECHNIQUE: Multidetector CT imaging of the abdomen and pelvis was performed following the standard protocol without IV contrast. COMPARISON:  01/01/2021 FINDINGS: Lower chest: Hypoventilatory changes at the lung bases. No acute airspace disease or effusion. Hepatobiliary: Cholecystectomy. Unenhanced imaging of the liver is unremarkable. Pancreas: Unremarkable. No pancreatic ductal dilatation or surrounding inflammatory changes. Spleen: Normal in size without focal abnormality. Adrenals/Urinary Tract: Stable left renal cortical cyst. No urinary tract calculi or obstructive uropathy. Bladder is unremarkable. Adrenals are normal. Stomach/Bowel: No bowel obstruction or ileus. Postsurgical changes are seen from diverting colostomy left mid abdomen. No bowel wall thickening or inflammatory change. Vascular/Lymphatic: Aortic atherosclerosis. No enlarged abdominal or pelvic lymph nodes. Reproductive: Uterus and bilateral adnexa are unremarkable. Other: No free fluid or free gas. Musculoskeletal: No acute or destructive bony lesions. Stable postsurgical changes right inguinal region.  Reconstructed images demonstrate no additional findings. IMPRESSION: 1. No acute intra-abdominal or intrapelvic process. 2. Stable postsurgical changes from diverting colostomy. No bowel obstruction or ileus. 3.  Aortic Atherosclerosis (ICD10-I70.0). Electronically Signed   By: Randa Ngo M.D.   On: 04/20/2021 23:07   CT HEAD WO CONTRAST (5MM)  Result Date: 04/20/2021 CLINICAL DATA:  Mental status change, unknown cause EXAM: CT HEAD WITHOUT CONTRAST TECHNIQUE: Contiguous axial images were obtained from the base of the skull through the vertex without intravenous contrast. COMPARISON:  09/26/2015 FINDINGS: Brain: Diffuse cerebral atrophy. No acute intracranial abnormality.  Specifically, no hemorrhage, hydrocephalus, mass lesion, acute infarction, or significant intracranial injury. Vascular: No hyperdense vessel or unexpected calcification. Skull: No acute calvarial abnormality. Sinuses/Orbits: No acute findings Other: None IMPRESSION: Cerebral atrophy.  No acute intracranial abnormality. Electronically Signed   By: Rolm Baptise M.D.   On: 04/20/2021 23:04   DG Chest Port 1 View  Result Date: 04/21/2021 CLINICAL DATA:  65 year old female positive COVID-19. Shortness of breath. Right side chest pain. EXAM: PORTABLE CHEST 1 VIEW COMPARISON:  Portable chest 04/20/2021 and earlier. FINDINGS: Portable AP semi upright view at 0733 hours. Lower lung volumes. Stable cardiomegaly and mediastinal contours. Visualized tracheal air column is within normal limits. No pneumothorax, pulmonary edema or pleural effusion. Streaky bibasilar pulmonary opacity appears new or increased from yesterday. No consolidation. Paucity of bowel gas. No acute osseous abnormality identified. IMPRESSION: Lower lung volumes since yesterday with increased bibasilar atelectasis versus patchy COVID-19 pneumonia. No pleural effusion. Electronically Signed   By: Genevie Ann M.D.   On: 04/21/2021 07:55   DG Chest Port 1 View  Result Date: 04/20/2021 CLINICAL DATA:  Short of breath, hypoxic, missed dialysis EXAM: PORTABLE CHEST 1 VIEW COMPARISON:  03/05/2021 FINDINGS: Single frontal view of the chest demonstrates stable enlargement of the cardiac silhouette. Chronic vascular congestion without airspace disease, effusion, or pneumothorax. No acute bony abnormalities. IMPRESSION: 1. Chronic vascular congestion without overt edema. Electronically Signed   By: Randa Ngo M.D.   On: 04/20/2021 20:44   DG Knee Complete 4 Views Left  Result Date: 03/24/2021 CLINICAL DATA:  Pain and decreased mobility. EXAM: LEFT KNEE - COMPLETE 4+ VIEW COMPARISON:  01/31/2020 FINDINGS: Small knee joint effusion. Medial compartment  osteoarthritis with joint space narrowing and marginal osteophytes. Some degenerative change also at the patellofemoral joint. Regional arterial calcification is noted. IMPRESSION: Medial compartment and patellofemoral compartment osteoarthritis. Small joint effusion. Electronically Signed   By: Nelson Chimes M.D.   On: 03/24/2021 14:38   VAS [place] LOWER EXTREMITY VENOUS (DVT) ([facility] and [facility] 7a-7p)  Result Date: 03/24/2021  Lower Venous DVT Study Patient Name:  SAPHRONIA OZDEMIR  Date of Exam:   03/24/2021 Medical Rec #: [id number]      Accession #:    [id number] Date of Birth: 1955-11-19      Patient Gender: F Patient Age:   76 years Exam Location:  [facility] Procedure:      VAS [place] LOWER EXTREMITY VENOUS (DVT) Referring Phys: SOPHIA CACCAVALE --------------------------------------------------------------------------------  Indications: Pain.  Limitations: Suboptimal due to patient positioning/scanning conditions as well as body habitus/patient pain tolerance. Comparison Study: 05-16-2020 Most recent prior study- right lower extremity                   venous was negative for DVT. Performing Technologist: Darlin Coco RDMS, RVT  Examination Guidelines: A complete evaluation includes B-mode imaging, spectral Doppler, color Doppler, and power Doppler as needed of all accessible  portions of each vessel. Bilateral testing is considered an integral part of a complete examination. Limited examinations for reoccurring indications may be performed as noted. The reflux portion of the exam is performed with the patient in reverse Trendelenburg.  +---------+---------------+---------+-----------+----------+-------------------+ RIGHT    CompressibilityPhasicitySpontaneityPropertiesThrombus Aging      +---------+---------------+---------+-----------+----------+-------------------+ CFV      Full           Yes      Yes                                       +---------+---------------+---------+-----------+----------+-------------------+ SFJ      Full                                                             +---------+---------------+---------+-----------+----------+-------------------+ FV Prox  Full                                                             +---------+---------------+---------+-----------+----------+-------------------+ FV Mid   Full                                                             +---------+---------------+---------+-----------+----------+-------------------+ FV DistalFull                                                             +---------+---------------+---------+-----------+----------+-------------------+ PFV      Full                                                             +---------+---------------+---------+-----------+----------+-------------------+ POP      Full           Yes      Yes                                      +---------+---------------+---------+-----------+----------+-------------------+ PTV                     Yes      Yes                                      +---------+---------------+---------+-----------+----------+-------------------+ PERO  Not well visualized +---------+---------------+---------+-----------+----------+-------------------+   +---------+---------------+---------+-----------+----------+-------------------+ LEFT     CompressibilityPhasicitySpontaneityPropertiesThrombus Aging      +---------+---------------+---------+-----------+----------+-------------------+ CFV      Full           Yes      Yes                                      +---------+---------------+---------+-----------+----------+-------------------+ SFJ      Full                                                             +---------+---------------+---------+-----------+----------+-------------------+ FV  Prox  Full                                                             +---------+---------------+---------+-----------+----------+-------------------+ FV Mid   Full                                                             +---------+---------------+---------+-----------+----------+-------------------+ FV Distal               Yes      Yes                                      +---------+---------------+---------+-----------+----------+-------------------+ PFV      Full                                                             +---------+---------------+---------+-----------+----------+-------------------+ POP      Full           Yes      Yes                                      +---------+---------------+---------+-----------+----------+-------------------+ PTV                     Yes      Yes                                      +---------+---------------+---------+-----------+----------+-------------------+ PERO                                                  Not well visualized +---------+---------------+---------+-----------+----------+-------------------+     Summary: RIGHT: -  There is no evidence of deep vein thrombosis in the lower extremity. However, portions of this examination were limited- see technologist comments above.  - No cystic structure found in the popliteal fossa.  LEFT: - There is no evidence of deep vein thrombosis in the lower extremity. However, portions of this examination were limited- see technologist comments above.  - No cystic structure found in the popliteal fossa.  *See table(s) above for measurements and observations. Electronically signed by Servando Snare MD on 03/24/2021 at 11:00:17 PM.    Final      Today   Subjective    [name] today has no headache,no chest abdominal pain,no new weakness tingling or numbness, feels much better wants to go home today.    Objective   Blood pressure 133/66, pulse 68, temperature 98  F (36.7 C), temperature source Oral, resp. rate 19, height 5\' 8"  (1.727 m), weight 119.8 kg, SpO2 96 %.   Intake/Output Summary (Last 24 hours) at 04/23/2021 1101 Last data filed at 04/23/2021 0600 Gross per 24 hour  Intake 100 ml  Output 4800 ml  Net -4700 ml    Exam  Awake Alert, No new F.N deficits, Normal affect [place].AT,PERRAL Supple Neck,No JVD, No cervical lymphadenopathy appriciated.  Symmetrical Chest wall movement, Good air movement bilaterally, CTAB RRR,No Gallops,Rubs or new Murmurs, No Parasternal Heave +ve B.Sounds, Abd Soft, Non tender, No organomegaly appriciated, No rebound -guarding or rigidity. No Cyanosis, Clubbing or edema, No new Rash or bruise   Data Review   CBC w Diff:  Lab Results  Component Value Date   WBC 5.7 04/23/2021   HGB 11.1 (L) 04/23/2021   HGB 9.7 (L) 09/27/2020   HCT 33.2 (L) 04/23/2021   HCT 28.9 (L) 09/27/2020   PLT 244 04/23/2021   PLT 209 09/27/2020   LYMPHOPCT 17 04/23/2021   MONOPCT 10 04/23/2021   EOSPCT 0 04/23/2021   BASOPCT 0 04/23/2021    CMP:  Lab Results  Component Value Date   NA 135 04/23/2021   NA 139 09/27/2020   K 4.0 04/23/2021   CL 96 (L) 04/23/2021   CO2 23 04/23/2021   BUN 47 (H) 04/23/2021   BUN 9 09/27/2020   CREATININE 6.84 (H) 04/23/2021   CREATININE 1.36 (H) 09/23/2016   PROT 6.6 04/23/2021   PROT 6.8 09/27/2020   ALBUMIN 2.6 (L) 04/23/2021   ALBUMIN 4.0 09/27/2020   BILITOT 0.3 04/23/2021   BILITOT 0.4 09/27/2020   ALKPHOS 139 (H) 04/23/2021   AST 22 04/23/2021   ALT 23 04/23/2021  .   Total Time in preparing paper work, data evaluation and todays exam - 48 minutes  Lala Lund M.D on 04/23/2021 at 11:01 AM  [facility]

## 2021-04-23 NOTE — Progress Notes (Signed)
Lower extremity venous has been completed.   Preliminary results in CV Proc.   Jinny Blossom Derian Dimalanta 04/23/2021 11:45 AM

## 2021-04-23 NOTE — Progress Notes (Signed)
Contacted [facility] regarding another pt and they inquired about this pt's possible d/c date. Returned call to Quita Skye, Agricultural consultant, to advise him that pt to d/c today and will resume care tomorrow. Clinic aware pt is covid positive.  Melven Sartorius Renal Navigator [phone]

## 2021-04-25 LAB — CULTURE, BLOOD (ROUTINE X 2)
Culture: NO GROWTH
Special Requests: ADEQUATE

## 2021-04-26 LAB — CULTURE, BLOOD (ROUTINE X 2)
Culture: NO GROWTH
Special Requests: ADEQUATE

## 2021-05-05 ENCOUNTER — Telehealth: Payer: Self-pay

## 2021-05-05 NOTE — Telephone Encounter (Signed)
[facility] faxed refill request for the following medications:    dicyclomine (BENTYL) 20 MG tablet   Doxepin 10 mg capsules  Please advise.

## 2021-05-05 NOTE — Telephone Encounter (Signed)
[facility] faxed refill request for the following medications:   fluticasone furoate-vilanterol (BREO ELLIPTA) 200-25 MCG/INH AEPB   Please advise.

## 2021-05-05 NOTE — Telephone Encounter (Signed)
Pt is no longer a pt at [facility]   Thanks,   -Mickel Baas

## 2021-05-07 ENCOUNTER — Ambulatory Visit: Payer: Medicare Other | Admitting: Podiatry

## 2021-05-15 ENCOUNTER — Telehealth: Payer: Self-pay

## 2021-05-15 NOTE — Telephone Encounter (Signed)
[facility] faxed refill request for the following medications:  OMEGA-3 ACID ETHYL CAPSULES 1GM    NIACIN 500 TABLETS   Please advise.

## 2021-05-15 NOTE — Telephone Encounter (Signed)
Ms. Cottingham is not longer a pt here.   Thanks,   -Mickel Baas

## 2021-05-28 ENCOUNTER — Telehealth: Payer: Self-pay

## 2021-05-28 NOTE — Telephone Encounter (Signed)
Patient no longer at [facility].

## 2021-05-28 NOTE — Telephone Encounter (Signed)
[facility] faxed refill request for the following medications:  pregabalin (LYRICA) 150 MG capsule   Please advise.

## 2021-06-14 ENCOUNTER — Other Ambulatory Visit: Payer: Self-pay | Admitting: Physician Assistant

## 2021-06-14 ENCOUNTER — Other Ambulatory Visit: Payer: Self-pay | Admitting: Family Medicine

## 2021-06-14 DIAGNOSIS — G629 Polyneuropathy, unspecified: Secondary | ICD-10-CM

## 2021-06-14 DIAGNOSIS — I5032 Chronic diastolic (congestive) heart failure: Secondary | ICD-10-CM

## 2021-06-14 DIAGNOSIS — J9601 Acute respiratory failure with hypoxia: Secondary | ICD-10-CM

## 2021-06-14 DIAGNOSIS — J301 Allergic rhinitis due to pollen: Secondary | ICD-10-CM

## 2021-06-14 DIAGNOSIS — E1122 Type 2 diabetes mellitus with diabetic chronic kidney disease: Secondary | ICD-10-CM

## 2021-06-14 DIAGNOSIS — Z794 Long term (current) use of insulin: Secondary | ICD-10-CM

## 2021-06-15 NOTE — Telephone Encounter (Signed)
goes to [facility] now- no longer a pt at [facility]

## 2021-06-23 ENCOUNTER — Other Ambulatory Visit: Payer: Self-pay | Admitting: Family Medicine

## 2021-06-23 DIAGNOSIS — J9601 Acute respiratory failure with hypoxia: Secondary | ICD-10-CM

## 2021-06-23 DIAGNOSIS — G629 Polyneuropathy, unspecified: Secondary | ICD-10-CM

## 2021-06-23 DIAGNOSIS — J301 Allergic rhinitis due to pollen: Secondary | ICD-10-CM
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
## 2021-06-26 ENCOUNTER — Other Ambulatory Visit: Payer: Self-pay | Admitting: Physician Assistant

## 2021-06-27 ENCOUNTER — Encounter: Payer: Self-pay | Admitting: Endocrinology

## 2021-06-27 ENCOUNTER — Other Ambulatory Visit: Payer: Self-pay

## 2021-06-27 ENCOUNTER — Ambulatory Visit (INDEPENDENT_AMBULATORY_CARE_PROVIDER_SITE_OTHER): Payer: Medicare Other | Admitting: Endocrinology

## 2021-06-27 VITALS — BP 142/70 | HR 90 | Ht 68.0 in | Wt 274.6 lb

## 2021-06-27 DIAGNOSIS — E1100 Type 2 diabetes mellitus with hyperosmolarity without nonketotic hyperglycemic-hyperosmolar coma (NKHHC): Secondary | ICD-10-CM

## 2021-06-27 MED ORDER — LEVEMIR FLEXTOUCH 100 UNIT/ML ~~LOC~~ SOPN: 130.0000 [IU] | PEN_INJECTOR | SUBCUTANEOUS | 3 refills | Status: DC

## 2021-06-27 NOTE — Progress Notes (Signed)
Subjective:    Patient ID: [name], female    DOB: 1955-08-17, 65 y.o.   MRN: [id number]  HPI pt is referred by Vernie Murders, PA, for diabetes.  Pt states DM was dx'ed in [id number]; it is complicated by ESRD, PN, CAD, PAD, and peroneal necrotizing fasciitis: she has been on insulin since 2002; pt says her diet and exercise are not good; she has never had GDM, pancreatic surgery, severe hypoglycemia, or DKA.  She had pancreatitis in 2002.  She has chronic weight gain.  Pt says cbg varies from 200-350. Pt says she sometimes misses the insulin.  She takes levemir, 60 units QHS, and Humalog 25 units 3 times a day (just before each meal).  She has not recently taken Victoza.   Past Medical History:  Diagnosis Date   Anemia    Aortic stenosis    Echo 8/18: mean 13, peak 28, LVOT/AV mean velocity 0.51   Arthritis    Asthma    As a child    Bronchitis    CAD (coronary artery disease)    a. 09/2016: 50% Ost 1st Mrg stenosis, 50% 2nd Mrg stenosis, 20% Mid-Cx, 95% Prox LAD, 40% mid-LAD, and 10% dist-LAD stenosis. Staged PCI with DES to Prox-LAD.    Chronic combined systolic and diastolic CHF (congestive heart failure) ([place]) 2011   echo 2/18: EF 55-60, normal wall motion, grade 2 diastolic dysfunction, trivial AI // echo 3/18: Septal and apical HK, EF 45-50, normal wall motion, trivial AI, mild LAE, PASP 38 // echo 8/18: EF 60-65, normal wall motion, grade 1 diastolic dysfunction, calcified aortic valve leaflets, mild aortic stenosis (mean 13, peak 28, LVOT/AV mean velocity 0.51), mild AI, moderate MAC, mild LAE, trivial TR    Chronic kidney disease    STAGE 4   Chronic kidney disease on chronic dialysis (HCC)    t, th, sat   Complication of anesthesia    Depression    Diabetes mellitus Dx 1989   Elevated lipids    GERD (gastroesophageal reflux disease)    Gout    Heart murmur    asymptomatic   Hepatitis C Dx 2013   Hypertension Dx 1989   Infected surgical wound    Lt arm   Myocardial  infarction ([place]) 07/2015   Obesity    Pancreatitis 2013   Pneumonia    Refusal of blood transfusions as patient is Jehovah's Witness    pt states she is not [place] witness and does not refuse blood products   Tendinitis    Tremors of nervous system    LEFT HAND   Ulcer 2010    Past Surgical History:  Procedure Laterality Date   A/V FISTULAGRAM Left 04/11/2019   Procedure: A/V FISTULAGRAM;  Surgeon: Katha Cabal, MD;  Location: [place] [facility];  Service: Cardiovascular;  Laterality: Left;   A/V FISTULAGRAM Left 06/02/2019   Procedure: A/V FISTULAGRAM;  Surgeon: Katha Cabal, MD;  Location: [place] [facility];  Service: Cardiovascular;  Laterality: Left;   APPLICATION OF WOUND VAC Left 08/14/2017   Procedure: APPLICATION OF WOUND VAC Exchange;  Surgeon: Robert Bellow, MD;  Location: [facility];  Service: General;  Laterality: Left;   APPLICATION OF WOUND VAC Left 12/21/2018   Procedure: APPLICATION OF WOUND VAC;  Surgeon: Katha Cabal, MD;  Location: [facility];  Service: Vascular;  Laterality: Left;   AV FISTULA PLACEMENT Left 08/19/2018   Procedure: ARTERIOVENOUS (AV) FISTULA CREATION ( BRACHIOBASILIC );  Surgeon:  Schnier, Dolores Lory, MD;  Location: [facility];  Service: Vascular;  Laterality: Left;   BASCILIC VEIN TRANSPOSITION Left 11/18/2018   Procedure: BASCILIC VEIN TRANSPOSITION;  Surgeon: Katha Cabal, MD;  Location: [facility];  Service: Vascular;  Laterality: Left;   BIOPSY  09/20/2020   Procedure: BIOPSY;  Surgeon: Carol Ada, MD;  Location: [place];  Service: Endoscopy;;   CHOLECYSTECTOMY     COLONOSCOPY WITH PROPOFOL N/A 02/03/2018   Procedure: COLONOSCOPY WITH PROPOFOL;  Surgeon: Lin Landsman, MD;  Location[facility];  Service: Gastroenterology;  Laterality: N/A;   CORONARY ANGIOPLASTY  07/2015   STENT   CORONARY STENT INTERVENTION N/A 09/18/2016   Procedure: Coronary Stent Intervention;  Surgeon: Troy Sine, MD;  Location: [place] [facility];  Service: Cardiovascular;  Laterality: N/A;   DIALYSIS/PERMA CATHETER INSERTION N/A 05/10/2018   Procedure: DIALYSIS/PERMA CATHETER INSERTION;  Surgeon: Katha Cabal, MD;  Location: [place] [facility];  Service: Cardiovascular;  Laterality: N/A;   DRESSING CHANGE UNDER ANESTHESIA Left 08/15/2017   Procedure: exploration of wound for bleeding;  Surgeon: Robert Bellow, MD;  Location: [facility];  Service: General;  Laterality: Left;   ESOPHAGOGASTRODUODENOSCOPY (EGD) WITH PROPOFOL N/A 02/03/2018   Procedure: ESOPHAGOGASTRODUODENOSCOPY (EGD) WITH PROPOFOL;  Surgeon: Lin Landsman, MD;  Location: [facility];  Service: Gastroenterology;  Laterality: N/A;   ESOPHAGOGASTRODUODENOSCOPY (EGD) WITH PROPOFOL N/A 09/20/2020   Procedure: ESOPHAGOGASTRODUODENOSCOPY (EGD) WITH PROPOFOL;  Surgeon: Carol Ada, MD;  Location: [place];  Service: Endoscopy;  Laterality: N/A;   EYE SURGERY  11/17/2018   INCISION AND DRAINAGE ABSCESS Left 08/12/2017   Procedure: INCISION AND DRAINAGE ABSCESS;  Surgeon: Robert Bellow, MD;  Location: [facility];  Service: General;  Laterality: Left;   KNEE ARTHROSCOPY     LEFT HEART CATH N/A 09/18/2016   Procedure: Left Heart Cath;  Surgeon: Troy Sine, MD;  Location: [place] [facility];  Service: Cardiovascular;  Laterality: N/A;   LEFT HEART CATH AND CORONARY ANGIOGRAPHY N/A 09/16/2016   Procedure: Left Heart Cath and Coronary Angiography;  Surgeon: Burnell Blanks, MD;  Location: [place] [facility];  Service: Cardiovascular;  Laterality: N/A;   LEFT HEART CATH AND CORONARY ANGIOGRAPHY N/A 04/29/2017   Procedure: LEFT HEART CATH AND CORONARY ANGIOGRAPHY;  Surgeon: Nelva Bush, MD;  Location: [place] [facility];  Service: Cardiovascular;  Laterality: N/A;   LOWER EXTREMITY ANGIOGRAPHY Right 03/08/2018   Procedure: LOWER EXTREMITY ANGIOGRAPHY;  Surgeon: Katha Cabal, MD;  Location: [place] [facility];  Service:  Cardiovascular;  Laterality: Right;   TUBAL LIGATION     TUBAL LIGATION     UPPER EXTREMITY ANGIOGRAPHY Right 09/19/2019   Procedure: UPPER EXTREMITY ANGIOGRAPHY;  Surgeon: Katha Cabal, MD;  Location: [place] [facility];  Service: Cardiovascular;  Laterality: Right;   WOUND DEBRIDEMENT Left 12/21/2018   Procedure: DEBRIDEMENT WOUND;  Surgeon: Katha Cabal, MD;  Location: [facility];  Service: Vascular;  Laterality: Left;   WOUND DEBRIDEMENT Left 12/30/2018   Procedure: DEBRIDEMENT WOUND WITH VAC PLACEMENT (LEFT UPPER EXTREMITY);  Surgeon: Katha Cabal, MD;  Location: [facility];  Service: Vascular;  Laterality: Left;    Social History   Socioeconomic History   Marital status: Divorced    Spouse name: Not on file   Number of children: 3   Years of education: Not on file   Highest education level: Bachelor's degree (e.g., BA, AB, BS)  Occupational History   Occupation: disability  Tobacco Use   Smoking status: Former    Packs/day: 0.25    Years: 6.00    Pack years: 1.50    Types: Cigarettes    Quit date: 10/25/1980    Years since quitting: 40.7   Smokeless tobacco: Never  Vaping Use   Vaping Use: Never used  Substance and Sexual Activity   Alcohol use: Yes    Comment: rare   Drug use: Not Currently    Types: Marijuana   Sexual activity: Not on file    Comment: Not asked  Other Topics Concern   Not on file  Social History Narrative   Not on file   Social Determinants of Health   Financial Resource Strain: Not on file  Food Insecurity: Not on file  Transportation Needs: Not on file  Physical Activity: Not on file  Stress: Not on file  Social Connections: Not on file  Intimate Partner Violence: Not on file    Current Outpatient Medications on File Prior to Visit  Medication Sig Dispense Refill   albuterol (VENTOLIN HFA) 108 (90 Base) MCG/ACT inhaler Inhale 2 puffs into the lungs every 4 (four) hours as needed for wheezing or shortness of breath. 18 g 5    allopurinol (ZYLOPRIM) 100 MG tablet TAKE 1 TABLET BY MOUTH ONCE DAILY. 28 tablet 11   aspirin 81 MG EC tablet Take 1 tablet (81 mg total) by mouth daily. 30 tablet 12   atorvastatin (LIPITOR) 80 MG tablet Take 1 tablet (80 mg total) by mouth every evening. 90 tablet 0   AURYXIA 1 GM 210 MG(Fe) tablet Take 2 tablets (420 mg total) by mouth 3 (three) times daily with meals. 540 tablet 3   B Complex-C-Folic Acid (RENA-VITE RX PO) Take 1 mg by mouth daily.     B Complex-C-Folic Acid (RENA-VITE RX) 1 MG TABS Take 1 mg by mouth daily.     carvedilol (COREG) 25 MG tablet Take 1 tablet (25 mg total) by mouth 2 (two) times daily with a meal. 180 tablet 1   Continuous Blood Gluc Receiver (FREESTYLE LIBRE 14 DAY READER) DEVI To check blood sugar ACHS 1 each 0   cyclobenzaprine (FLEXERIL) 10 MG tablet Take 1 tablet (10 mg total) by mouth 2 (two) times daily as needed for muscle spasms. 20 tablet 0   diclofenac Sodium (VOLTAREN) 1 % GEL Apply 2 g topically 4 (four) times daily. 100 g 0   docusate sodium (COLACE) 100 MG capsule Take 1 capsule (100 mg total) by mouth 2 (two) times daily as needed for mild constipation. 30 capsule 0   doxepin (SINEQUAN) 10 MG capsule TAKE 1 CAPSULE(10 MG) BY MOUTH FOUR TIMES DAILY AS NEEDED FOR ITCHING (Patient taking differently: Take 10 mg by mouth 2 (two) times daily.) 360 capsule 3   fluticasone furoate-vilanterol (BREO ELLIPTA) 200-25 MCG/INH AEPB Inhale 1 puff into the lungs daily. 60 each 0   hydrALAZINE (APRESOLINE) 100 MG tablet Take 100 mg by mouth 3 (three) times daily.     hydrOXYzine (ATARAX/VISTARIL) 25 MG tablet Take 1 tablet (25 mg total) by mouth 4 (four) times daily. 360 tablet 1   lidocaine-prilocaine (EMLA) cream Apply 1 application topically as needed. (Patient taking differently: Apply 1 application topically as needed (dialysis days).) 30 g 11   loratadine (CLARITIN) 10 MG tablet TAKE 1 TABLET BY MOUTH ONCE DAILY. (Patient taking differently: Take 10 mg by  mouth daily as needed for allergies.) 90 tablet 3   montelukast (SINGULAIR) 10 MG  tablet TAKE ONE TABLET BY MOUTH AT BEDTIME. (Patient taking differently: Take 10 mg by mouth at bedtime as needed (allergies).) 90 tablet 3   nitroGLYCERIN (NITROSTAT) 0.4 MG SL tablet Place 1 tablet (0.4 mg total) under the tongue every 5 (five) minutes x 3 doses as needed for chest pain. 25 tablet 12   oxyCODONE (ROXICODONE) 5 MG immediate release tablet Take 1 tablet (5 mg total) by mouth every 12 (twelve) hours as needed for severe pain. 60 tablet 0   oxyCODONE-acetaminophen (PERCOCET/ROXICET) 5-325 MG tablet Take 1 tablet by mouth every 8 (eight) hours as needed. 12 tablet 0   pregabalin (LYRICA) 150 MG capsule Take 1 capsule (150 mg total) by mouth 2 (two) times daily. 180 capsule 1   SENNA-PLUS 8.6-50 MG tablet TAKE ONE TABLET BY MOUTH AT BEDTIME. 90 tablet 3   sevelamer carbonate (RENVELA) 800 MG tablet Take 800 mg by mouth 3 (three) times daily.     triamcinolone cream (KENALOG) 0.1 % APPLY TO AFFECTED AREA TWICE DAILY (Patient taking differently: Apply 1 application topically 2 (two) times daily as needed (rash).) 80 g 0   ULTICARE SHORT PEN NEEDLES 31G X 8 MM MISC USE THREE TIMES DAILY WITH INSULIN 100 each 0   valACYclovir (VALTREX) 500 MG tablet TAKE (1) TABLET BY MOUTH EVERY OTHER DAY. 45 tablet 1   Vitamin D, Ergocalciferol, (DRISDOL) 1.25 MG (50000 UNIT) CAPS capsule TAKE 1 CAPSULE BY MOUTH ONCE A MONTH 1 capsule 11   buPROPion (WELLBUTRIN SR) 150 MG 12 hr tablet Take 1 tablet (150 mg total) by mouth 2 (two) times daily. 180 tablet 3   colchicine 0.6 MG tablet Take 1 tablet (0.6 mg total) by mouth once a week for 15 days. 2 tablet 0   Continuous Blood Gluc Sensor (FREESTYLE LIBRE 14 DAY SENSOR) MISC To check blood sugar ACHS; change every 14 days 6 each 11   dicyclomine (BENTYL) 20 MG tablet Take 1 tablet (20 mg total) by mouth in the morning and at bedtime. 180 tablet 1   fluticasone (FLONASE) 50  MCG/ACT nasal spray Place 2 sprays into both nostrils daily as needed for allergies or rhinitis. 48 g 1   Nutritional Supplements (FEEDING SUPPLEMENT, NEPRO CARB STEADY,) LIQD Take 237 mLs by mouth daily. 7110 mL 0   nystatin cream (MYCOSTATIN) Apply 1 application topically 2 (two) times daily. (Patient taking differently: Apply 1 application topically daily as needed for dry skin.) 30 g 0   omeprazole (PRILOSEC) 20 MG capsule Take 20 mg by mouth daily.     pantoprazole (PROTONIX) 40 MG tablet Take 1 tablet (40 mg total) by mouth 2 (two) times daily. 60 tablet 2   sucralfate (CARAFATE) 1 g tablet Take 1 tablet (1 g total) by mouth 2 (two) times daily for 26 days. 52 tablet 0   torsemide (DEMADEX) 20 MG tablet TAKE (2) TABLETS BY MOUTH TWICE DAILY. 360 tablet 1   [DISCONTINUED] gabapentin (NEURONTIN) 100 MG capsule Take 1 capsule (100 mg total) by mouth 3 (three) times daily. 90 capsule 1   No current facility-administered medications on file prior to visit.    Allergies  Allergen Reactions   Morphine Itching    Tolerated hydromorphone on 07/2020   Shellfish Allergy Anaphylaxis and Swelling   Diazepam Other (See Comments)    "felt like out of body experience"    Family History  Problem Relation Age of Onset   Colon cancer Mother    Heart attack Other  Heart attack Maternal Grandmother    Hypertension Sister    Hypertension Brother    Diabetes Paternal Grandmother    Breast cancer Neg Hx     BP (!) 142/70 (BP Location: Right Arm, Patient Position: Sitting, Cuff Size: Large)    Pulse 90    Ht _0  (1.727 m)    Wt 274 lb 9.6 oz (124.6 kg)    SpO2 94%    BMI 41.75 kg/m    Review of Systems denies sob, n/v, memory loss.      Objective:   Physical Exam Pulses: dorsalis pedis intact bilat.   MSK: no deformity of the feet CV: 2+ bilat leg edema Skin:  no ulcer on the feet.  normal color and temp on the feet. Neuro: sensation is intact to touch on the feet, but decreased from  normal Ext: there is bilateral onychomycosis of the toenails   I have reviewed outside records, and summarized: Pt was noted to have elevated A1c, and referred here.  2 mos ago, he was admitted with sepsis and HONK.  A1c=11.5%    Assessment & Plan:  Insulin-requiring type 2 DM: uncontrolled.  At this A1c, she is not gaining the benefit of multiple daily injections.    Patient Instructions  good diet and exercise significantly improve the control of your diabetes.  please let me know if you wish to be referred to a dietician.  high blood sugar is very risky to your health.  you should see an eye doctor and dentist every year.  It is very important to get all recommended vaccinations.  Controlling your blood pressure and cholesterol drastically reduces the damage diabetes does to your body.  Those who smoke should quit.  Please discuss these with your doctor.  We will need to take this complex situation in stages.  I have sent a prescription to your pharmacy, to change both insulins to Levemir, 130 units each morning. On this type of insulin schedule, you should eat meals on a regular schedule.  If a meal is missed or significantly delayed, your blood sugar could go low. check your blood sugar twice a day.  vary the time of day when you check, between before the 3 meals, and at bedtime.  also check if you have symptoms of your blood sugar being too high or too low.  please keep a record of the readings and bring it to your next appointment here (or you can bring the meter itself).  You can write it on any piece of paper.  please call us sooner if your blood sugar goes below 70, or if most of your readings are over 200.   Please call or message [place] next week, to tell us how the blood sugar is doing.   Please come back for a follow-up appointment in 1 month.

## 2021-06-27 NOTE — Patient Instructions (Addendum)
good diet and exercise significantly improve the control of your diabetes.  please let me know if you wish to be referred to a dietician.  high blood sugar is very risky to your health.  you should see an eye doctor and dentist every year.  It is very important to get all recommended vaccinations.  Controlling your blood pressure and cholesterol drastically reduces the damage diabetes does to your body.  Those who smoke should quit.  Please discuss these with your doctor.  We will need to take this complex situation in stages.  I have sent a prescription to your pharmacy, to change both insulins to Levemir, 130 units each morning. On this type of insulin schedule, you should eat meals on a regular schedule.  If a meal is missed or significantly delayed, your blood sugar could go low. check your blood sugar twice a day.  vary the time of day when you check, between before the 3 meals, and at bedtime.  also check if you have symptoms of your blood sugar being too high or too low.  please keep a record of the readings and bring it to your next appointment here (or you can bring the meter itself).  You can write it on any piece of paper.  please call us sooner if your blood sugar goes below 70, or if most of your readings are over 200.   Please call or message [place] next week, to tell us how the blood sugar is doing.   Please come back for a follow-up appointment in 1 month.

## 2021-07-01 IMAGING — CT CT ANGIO CHEST
3 of 6 series · 18 of 36 positions shown · IV contrast (omnipaque)
Comparison: Radiography 12/29/2018.  Chest CT 02/16/2015

CLINICAL DATA: Vascular access.  Aortic aneurysm.  Smoking history.

EXAM:
CT ANGIOGRAPHY CHEST WITH CONTRAST
TECHNIQUE: Multidetector CT imaging of the chest was performed using the
standard protocol during bolus administration of intravenous
contrast. Multiplanar CT image reconstructions and MIPs were
obtained to evaluate the vascular anatomy.
CONTRAST:  75mL OMNIPAQUE IOHEXOL 350 MG/ML SOLN

[Series 5: axial cta thorax · axial · 0.79mm/px · z∈[-1114,-846]mm · 12 of 160 slices shown]
[im 13/160  lung]
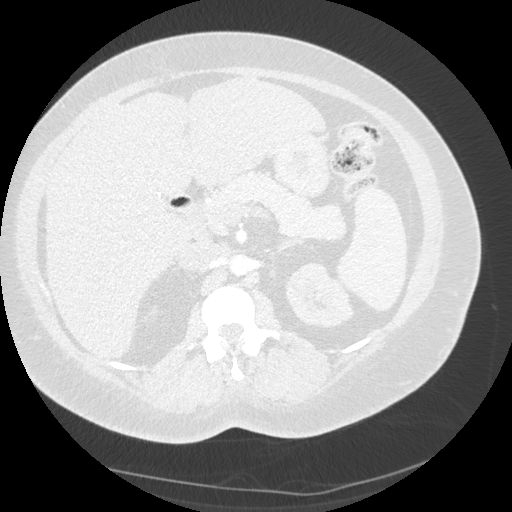
[im 25/160  mediastinal]
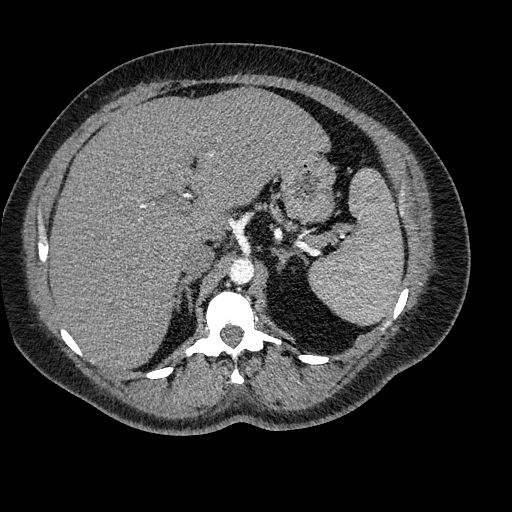
[im 37/160  lung]
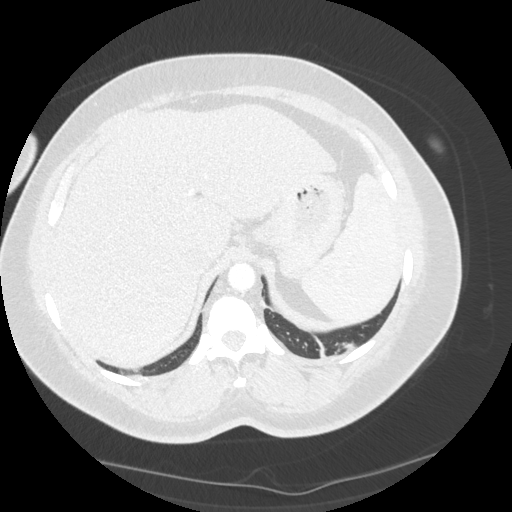
[im 49/160  mediastinal]
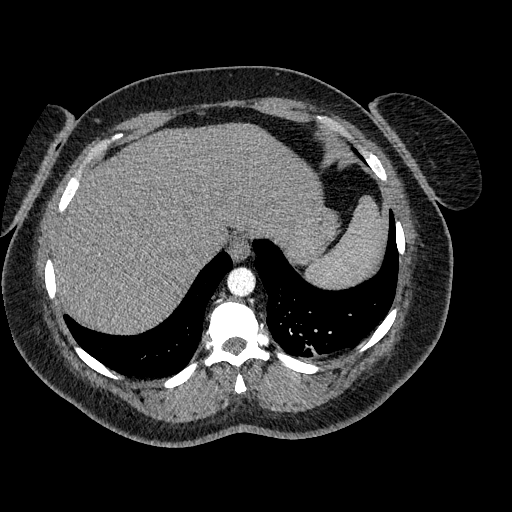
[im 62/160  lung]
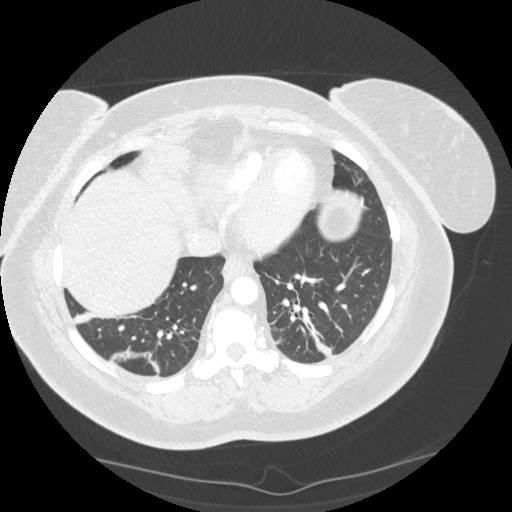
[im 74/160  mediastinal]
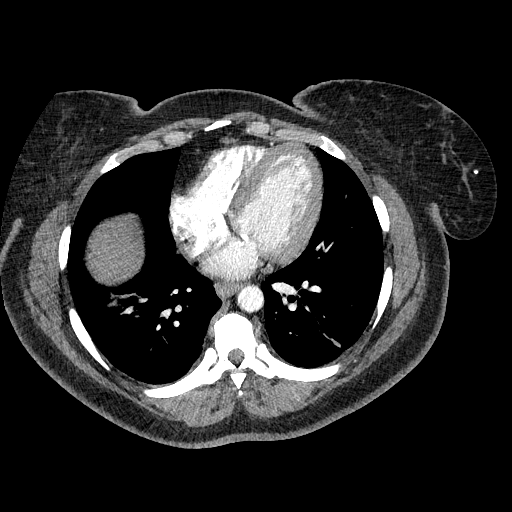
[im 86/160  lung]
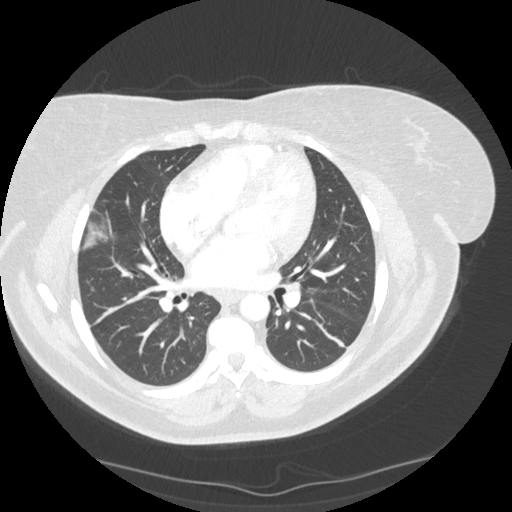
[im 98/160  mediastinal]
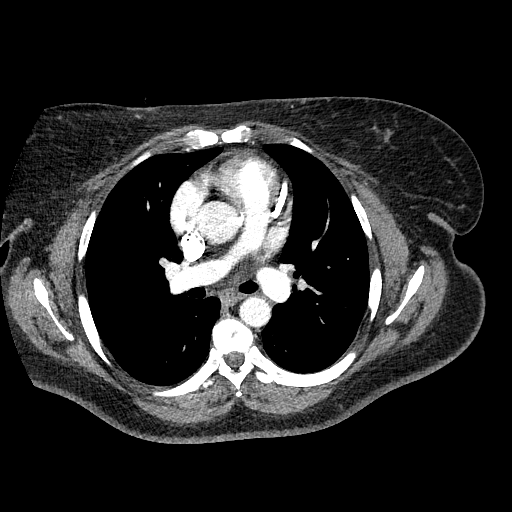
[im 111/160  lung]
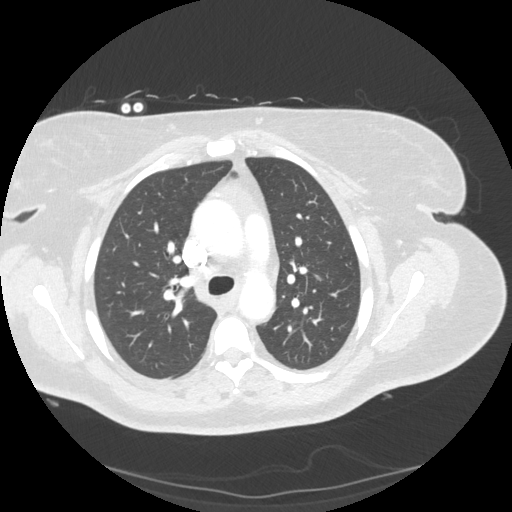
[im 123/160  mediastinal]
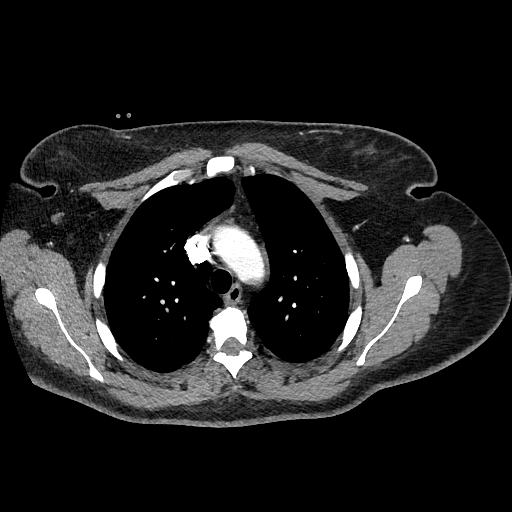
[im 135/160  lung]
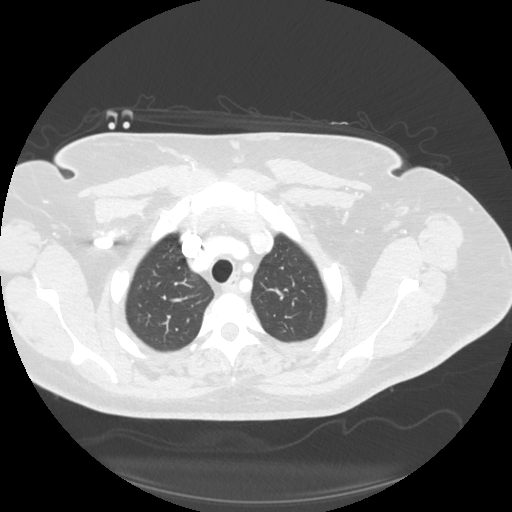
[im 147/160  mediastinal]
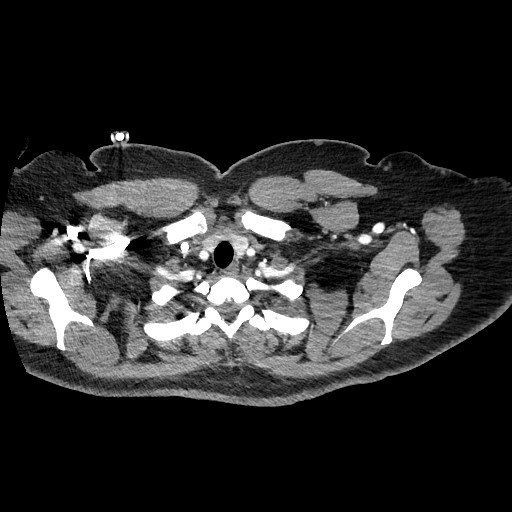

[Series 6: axial lung cta thorax · axial · 0.79mm/px · z∈[-1114,-968]mm · 5 of 160 slices shown]
[im 13/160  mediastinal]
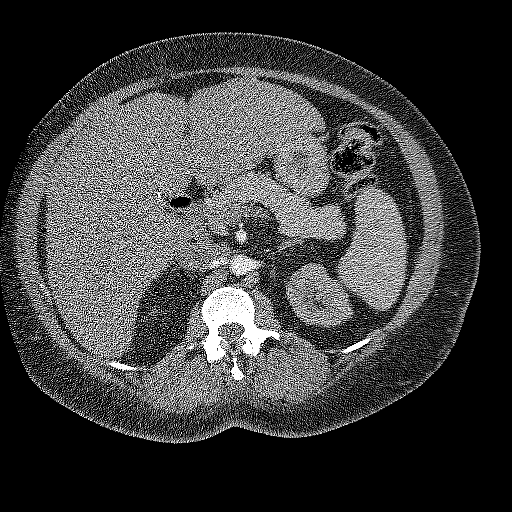
[im 37/160  mediastinal]
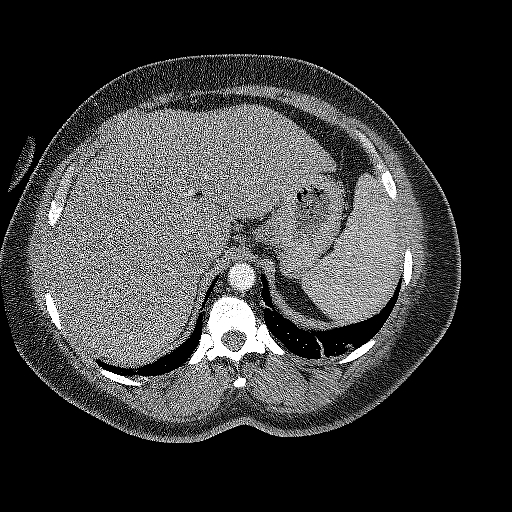
[im 49/160  mediastinal]
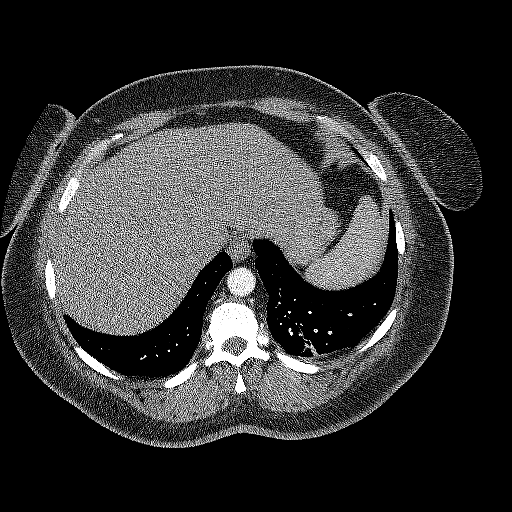
[im 74/160  mediastinal]
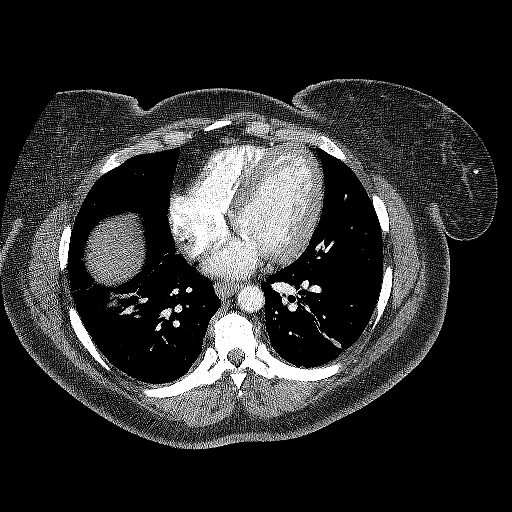
[im 86/160  mediastinal]
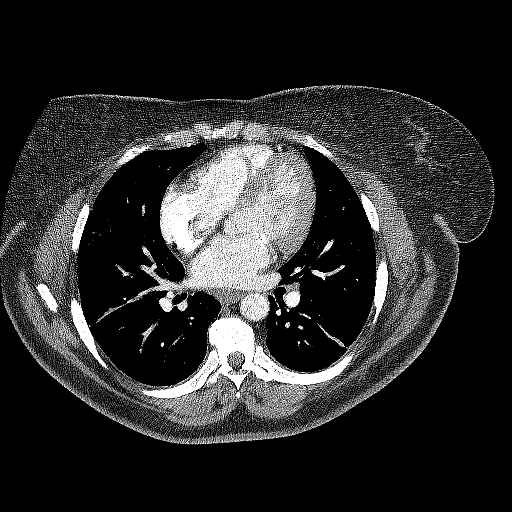

[Series 8: cor st cta thorax · coronal · 0.63mm/px · 1 of 166 slices shown]
[im 83/166  mediastinal]
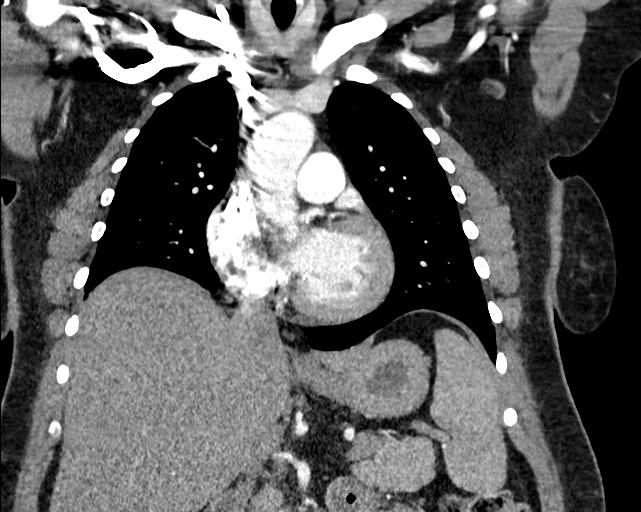

[18 of 36 positions shown; findings below may reference images not displayed]

FINDINGS: Cardiovascular: Dual lumen catheter enters the right internal
jugular vein. Tip cannot be evaluated because of contrast infusion.
Typical mixing pattern in the SVC. Cannot rule out some thrombus
associated with the catheter but this is not certain. Pulmonary
arterial opacification is only moderate. I do not see any definable
pulmonary emboli. There is aortic atherosclerosis. No aneurysm.
Maximal diameter of the ascending aorta is 3.6 cm. There is coronary
artery calcification and left system stent.

Mediastinum/Nodes: No mass or lymphadenopathy.

Lungs/Pleura: Upper lobes are clear except for a 3 mm density in the
lateral right upper lobe image 43 which is unchanged since 1844.
there is chronic linear scarring in both lower lobes, slightly more
pronounced than seen in 1844. This is presumed represent chronic
scarring, but mild active atelectasis can not be excluded. No
pleural fluid.

Upper Abdomen: Negative.  Previous cholecystectomy.

Musculoskeletal: Ordinary thoracic degenerative changes. Multiple
endplate Schmorl's nodes.

Review of the MIP images confirms the above findings.
IMPRESSION: No pulmonary emboli seen. Pulmonary arterial opacification is only
moderate.

Right internal jugular catheter in place. SVC is patent. Cannot rule
out the possibility of some thrombus associated with the distal
catheter, but this is not a reliable finding based on the acute
infusion and potential for mixing phenomenon.

Maximal diameter of the ascending aorta 3.6 cm.

Coronary artery calcification and left system stent.

Linear scarring at both lung bases. Cannot rule out an element of
atelectasis, but scarring is favored.

## 2021-07-02 NOTE — Telephone Encounter (Addendum)
[facility] calling to follow up on this [employer] request. Please advise if pt needs appt.

## 2021-07-07 ENCOUNTER — Other Ambulatory Visit: Payer: Self-pay | Admitting: Physician Assistant

## 2021-07-14 ENCOUNTER — Other Ambulatory Visit: Payer: Self-pay | Admitting: Family Medicine

## 2021-07-14 ENCOUNTER — Other Ambulatory Visit: Payer: Self-pay | Admitting: Physician Assistant

## 2021-07-14 DIAGNOSIS — G629 Polyneuropathy, unspecified: Secondary | ICD-10-CM

## 2021-07-14 DIAGNOSIS — A6000 Herpesviral infection of urogenital system, unspecified: Secondary | ICD-10-CM

## 2021-07-14 DIAGNOSIS — E559 Vitamin D deficiency, unspecified: Secondary | ICD-10-CM

## 2021-07-14 DIAGNOSIS — J301 Allergic rhinitis due to pollen: Secondary | ICD-10-CM

## 2021-07-14 DIAGNOSIS — M1A39X0 Chronic gout due to renal impairment, multiple sites, without tophus (tophi): Secondary | ICD-10-CM

## 2021-07-14 DIAGNOSIS — J9601 Acute respiratory failure with hypoxia: Secondary | ICD-10-CM

## 2021-07-17 ENCOUNTER — Ambulatory Visit: Payer: Medicare Other | Admitting: Endocrinology

## 2021-07-23 ENCOUNTER — Ambulatory Visit (INDEPENDENT_AMBULATORY_CARE_PROVIDER_SITE_OTHER): Payer: Medicare HMO | Admitting: Endocrinology

## 2021-07-23 ENCOUNTER — Other Ambulatory Visit: Payer: Self-pay

## 2021-07-23 VITALS — BP 148/60 | HR 93 | Ht 68.0 in | Wt 285.4 lb

## 2021-07-23 DIAGNOSIS — E1100 Type 2 diabetes mellitus with hyperosmolarity without nonketotic hyperglycemic-hyperosmolar coma (NKHHC): Secondary | ICD-10-CM

## 2021-07-23 LAB — POCT GLYCOSYLATED HEMOGLOBIN (HGB A1C): Hemoglobin A1C: 10.7 % — AB (ref 4.0–5.6)

## 2021-07-23 MED ORDER — LEVEMIR FLEXTOUCH 100 UNIT/ML ~~LOC~~ SOPN: 160.0000 [IU] | PEN_INJECTOR | SUBCUTANEOUS | 3 refills | Status: DC

## 2021-07-23 NOTE — Progress Notes (Signed)
Subjective:    Patient ID: [name], female    DOB: 07/04/1956, 66 y.o.   MRN: [id number]  HPI Pt returns for f/u of diabetes mellitus: DM type: Insulin-requiring type 2 Dx'ed: 6270 Complications: ESRD, PN, CAD, PAD, and peroneal necrotizing fasciitis Therapy: insulin since 2002 GDM: never DKA: never Severe hypoglycemia: never Pancreatitis: 2002 Pancreatic imaging: normal on 2022 CT SDOH:  Other: she takes QD insulin, after noncompliance with multiple daily injections Interval history: Pt says she never misses the insulin.  she brings her meter with her cbg's which I have reviewed today.  Cbg varies from 156-337.  She wants to be cleared for abd hernia surgery.   Past Medical History:  Diagnosis Date   Anemia    Aortic stenosis    Echo 8/18: mean 13, peak 28, LVOT/AV mean velocity 0.51   Arthritis    Asthma    As a child    Bronchitis    CAD (coronary artery disease)    a. 09/2016: 50% Ost 1st Mrg stenosis, 50% 2nd Mrg stenosis, 20% Mid-Cx, 95% Prox LAD, 40% mid-LAD, and 10% dist-LAD stenosis. Staged PCI with DES to Prox-LAD.    Chronic combined systolic and diastolic CHF (congestive heart failure) ([place]) 2011   echo 2/18: EF 55-60, normal wall motion, grade 2 diastolic dysfunction, trivial AI // echo 3/18: Septal and apical HK, EF 45-50, normal wall motion, trivial AI, mild LAE, PASP 38 // echo 8/18: EF 60-65, normal wall motion, grade 1 diastolic dysfunction, calcified aortic valve leaflets, mild aortic stenosis (mean 13, peak 28, LVOT/AV mean velocity 0.51), mild AI, moderate MAC, mild LAE, trivial TR    Chronic kidney disease    STAGE 4   Chronic kidney disease on chronic dialysis (HCC)    t, th, sat   Complication of anesthesia    Depression    Diabetes mellitus Dx 1989   Elevated lipids    GERD (gastroesophageal reflux disease)    Gout    Heart murmur    asymptomatic   Hepatitis C Dx 2013   Hypertension Dx 1989   Infected surgical wound    Lt arm    Myocardial infarction ([place]) 07/2015   Obesity    Pancreatitis 2013   Pneumonia    Refusal of blood transfusions as patient is Jehovah's Witness    pt states she is not [place] witness and does not refuse blood products   Tendinitis    Tremors of nervous system    LEFT HAND   Ulcer 2010    Past Surgical History:  Procedure Laterality Date   A/V FISTULAGRAM Left 04/11/2019   Procedure: A/V FISTULAGRAM;  Surgeon: Katha Cabal, MD;  Location: [place] [facility];  Service: Cardiovascular;  Laterality: Left;   A/V FISTULAGRAM Left 06/02/2019   Procedure: A/V FISTULAGRAM;  Surgeon: Katha Cabal, MD;  Location: [place] [facility];  Service: Cardiovascular;  Laterality: Left;   APPLICATION OF WOUND VAC Left 08/14/2017   Procedure: APPLICATION OF WOUND VAC Exchange;  Surgeon: Robert Bellow, MD;  Location: [facility];  Service: General;  Laterality: Left;   APPLICATION OF WOUND VAC Left 12/21/2018   Procedure: APPLICATION OF WOUND VAC;  Surgeon: Katha Cabal, MD;  Location: [facility];  Service: Vascular;  Laterality: Left;   AV FISTULA PLACEMENT Left 08/19/2018   Procedure: ARTERIOVENOUS (AV) FISTULA CREATION ( BRACHIOBASILIC );  Surgeon: Katha Cabal, MD;  Location: [facility];  Service: Vascular;  Laterality: Left;  BASCILIC VEIN TRANSPOSITION Left 11/18/2018   Procedure: BASCILIC VEIN TRANSPOSITION;  Surgeon: Katha Cabal, MD;  Location: [facility];  Service: Vascular;  Laterality: Left;   BIOPSY  09/20/2020   Procedure: BIOPSY;  Surgeon: Carol Ada, MD;  Location: [place];  Service: Endoscopy;;   CHOLECYSTECTOMY     COLONOSCOPY WITH PROPOFOL N/A 02/03/2018   Procedure: COLONOSCOPY WITH PROPOFOL;  Surgeon: Lin Landsman, MD;  Location: [facility];  Service: Gastroenterology;  Laterality: N/A;   CORONARY ANGIOPLASTY  07/2015   STENT   CORONARY STENT INTERVENTION N/A 09/18/2016   Procedure: Coronary Stent Intervention;  Surgeon: Troy Sine, MD;   Location: [place] [facility];  Service: Cardiovascular;  Laterality: N/A;   DIALYSIS/PERMA CATHETER INSERTION N/A 05/10/2018   Procedure: DIALYSIS/PERMA CATHETER INSERTION;  Surgeon: Katha Cabal, MD;  Location: [place] [facility];  Service: Cardiovascular;  Laterality: N/A;   DRESSING CHANGE UNDER ANESTHESIA Left 08/15/2017   Procedure: exploration of wound for bleeding;  Surgeon: Robert Bellow, MD;  Location: [facility];  Service: General;  Laterality: Left;   ESOPHAGOGASTRODUODENOSCOPY (EGD) WITH PROPOFOL N/A 02/03/2018   Procedure: ESOPHAGOGASTRODUODENOSCOPY (EGD) WITH PROPOFOL;  Surgeon: Lin Landsman, MD;  Location: [facility];  Service: Gastroenterology;  Laterality: N/A;   ESOPHAGOGASTRODUODENOSCOPY (EGD) WITH PROPOFOL N/A 09/20/2020   Procedure: ESOPHAGOGASTRODUODENOSCOPY (EGD) WITH PROPOFOL;  Surgeon: Carol Ada, MD;  Location: [place];  Service: Endoscopy;  Laterality: N/A;   EYE SURGERY  11/17/2018   INCISION AND DRAINAGE ABSCESS Left 08/12/2017   Procedure: INCISION AND DRAINAGE ABSCESS;  Surgeon: Robert Bellow, MD;  Location: [facility];  Service: General;  Laterality: Left;   KNEE ARTHROSCOPY     LEFT HEART CATH N/A 09/18/2016   Procedure: Left Heart Cath;  Surgeon: Troy Sine, MD;  Location: [place] [facility];  Service: Cardiovascular;  Laterality: N/A;   LEFT HEART CATH AND CORONARY ANGIOGRAPHY N/A 09/16/2016   Procedure: Left Heart Cath and Coronary Angiography;  Surgeon: Burnell Blanks, MD;  Location: [facility];  Service: Cardiovascular;  Laterality: N/A;   LEFT HEART CATH AND CORONARY ANGIOGRAPHY N/A 04/29/2017   Procedure: LEFT HEART CATH AND CORONARY ANGIOGRAPHY;  Surgeon: Nelva Bush, MD;  Location: [place] [facility];  Service: Cardiovascular;  Laterality: N/A;   LOWER EXTREMITY ANGIOGRAPHY Right 03/08/2018   Procedure: LOWER EXTREMITY ANGIOGRAPHY;  Surgeon: Katha Cabal, MD;  Location: [place] [facility];  Service:  Cardiovascular;  Laterality: Right;   TUBAL LIGATION     TUBAL LIGATION     UPPER EXTREMITY ANGIOGRAPHY Right 09/19/2019   Procedure: UPPER EXTREMITY ANGIOGRAPHY;  Surgeon: Katha Cabal, MD;  Location: [place] [facility];  Service: Cardiovascular;  Laterality: Right;   WOUND DEBRIDEMENT Left 12/21/2018   Procedure: DEBRIDEMENT WOUND;  Surgeon: Katha Cabal, MD;  Location: [facility];  Service: Vascular;  Laterality: Left;   WOUND DEBRIDEMENT Left 12/30/2018   Procedure: DEBRIDEMENT WOUND WITH VAC PLACEMENT (LEFT UPPER EXTREMITY);  Surgeon: Katha Cabal, MD;  Location: [facility];  Service: Vascular;  Laterality: Left;    Social History   Socioeconomic History   Marital status: Divorced    Spouse name: Not on file   Number of children: 3   Years of education: Not on file   Highest education level: Bachelor's degree (e.g., BA, AB, BS)  Occupational History   Occupation: disability  Tobacco Use   Smoking status: Former    Packs/day: 0.25    Years:  6.00    Pack years: 1.50    Types: Cigarettes    Quit date: 10/25/1980    Years since quitting: 40.7   Smokeless tobacco: Never  Vaping Use   Vaping Use: Never used  Substance and Sexual Activity   Alcohol use: Yes    Comment: rare   Drug use: Not Currently    Types: Marijuana   Sexual activity: Not on file    Comment: Not asked  Other Topics Concern   Not on file  Social History Narrative   Not on file   Social Determinants of Health   Financial Resource Strain: Not on file  Food Insecurity: Not on file  Transportation Needs: Not on file  Physical Activity: Not on file  Stress: Not on file  Social Connections: Not on file  Intimate Partner Violence: Not on file    Current Outpatient Medications on File Prior to Visit  Medication Sig Dispense Refill   albuterol (VENTOLIN HFA) 108 (90 Base) MCG/ACT inhaler Inhale 2 puffs into the lungs every 4 (four) hours as needed for wheezing or shortness of breath. 18 g 5    allopurinol (ZYLOPRIM) 100 MG tablet TAKE 1 TABLET BY MOUTH ONCE DAILY. 30 tablet 5   aspirin 81 MG EC tablet Take 1 tablet (81 mg total) by mouth daily. 30 tablet 12   atorvastatin (LIPITOR) 80 MG tablet Take 1 tablet (80 mg total) by mouth every evening. 90 tablet 0   AURYXIA 1 GM 210 MG(Fe) tablet Take 2 tablets (420 mg total) by mouth 3 (three) times daily with meals. 540 tablet 3   B Complex-C-Folic Acid (RENA-VITE RX PO) Take 1 mg by mouth daily.     B Complex-C-Folic Acid (RENA-VITE RX) 1 MG TABS Take 1 mg by mouth daily.     buPROPion (WELLBUTRIN SR) 150 MG 12 hr tablet Take 1 tablet (150 mg total) by mouth 2 (two) times daily. 180 tablet 3   carvedilol (COREG) 25 MG tablet Take 1 tablet (25 mg total) by mouth 2 (two) times daily with a meal. 180 tablet 1   colchicine 0.6 MG tablet Take 1 tablet (0.6 mg total) by mouth once a week for 15 days. 2 tablet 0   Continuous Blood Gluc Receiver (FREESTYLE LIBRE 14 DAY READER) DEVI To check blood sugar ACHS 1 each 0   Continuous Blood Gluc Sensor (FREESTYLE LIBRE 14 DAY SENSOR) MISC To check blood sugar ACHS; change every 14 days 6 each 11   cyclobenzaprine (FLEXERIL) 10 MG tablet Take 1 tablet (10 mg total) by mouth 2 (two) times daily as needed for muscle spasms. 20 tablet 0   diclofenac Sodium (VOLTAREN) 1 % GEL Apply 2 g topically 4 (four) times daily. 100 g 0   dicyclomine (BENTYL) 20 MG tablet Take 1 tablet (20 mg total) by mouth in the morning and at bedtime. 180 tablet 1   docusate sodium (COLACE) 100 MG capsule Take 1 capsule (100 mg total) by mouth 2 (two) times daily as needed for mild constipation. 30 capsule 0   doxepin (SINEQUAN) 10 MG capsule TAKE 1 CAPSULE(10 MG) BY MOUTH FOUR TIMES DAILY AS NEEDED FOR ITCHING (Patient taking differently: Take 10 mg by mouth 2 (two) times daily.) 360 capsule 3   fluticasone (FLONASE) 50 MCG/ACT nasal spray Place 2 sprays into both nostrils daily as needed for allergies or rhinitis. 48 g 1    fluticasone furoate-vilanterol (BREO ELLIPTA) 200-25 MCG/INH AEPB Inhale 1 puff into the lungs daily.  60 each 0   hydrALAZINE (APRESOLINE) 100 MG tablet Take 100 mg by mouth 3 (three) times daily.     hydrOXYzine (ATARAX/VISTARIL) 25 MG tablet Take 1 tablet (25 mg total) by mouth 4 (four) times daily. 360 tablet 1   lidocaine-prilocaine (EMLA) cream Apply 1 application topically as needed. (Patient taking differently: Apply 1 application topically as needed (dialysis days).) 30 g 11   loratadine (CLARITIN) 10 MG tablet TAKE 1 TABLET BY MOUTH ONCE DAILY. (Patient taking differently: Take 10 mg by mouth daily as needed for allergies.) 90 tablet 3   montelukast (SINGULAIR) 10 MG tablet TAKE ONE TABLET BY MOUTH AT BEDTIME. (Patient taking differently: Take 10 mg by mouth at bedtime as needed (allergies).) 90 tablet 3   nitroGLYCERIN (NITROSTAT) 0.4 MG SL tablet Place 1 tablet (0.4 mg total) under the tongue every 5 (five) minutes x 3 doses as needed for chest pain. 25 tablet 12   Nutritional Supplements (FEEDING SUPPLEMENT, NEPRO CARB STEADY,) LIQD Take 237 mLs by mouth daily. 7110 mL 0   nystatin cream (MYCOSTATIN) Apply 1 application topically 2 (two) times daily. (Patient taking differently: Apply 1 application topically daily as needed for dry skin.) 30 g 0   omeprazole (PRILOSEC) 20 MG capsule Take 20 mg by mouth daily.     oxyCODONE (ROXICODONE) 5 MG immediate release tablet Take 1 tablet (5 mg total) by mouth every 12 (twelve) hours as needed for severe pain. 60 tablet 0   oxyCODONE-acetaminophen (PERCOCET/ROXICET) 5-325 MG tablet Take 1 tablet by mouth every 8 (eight) hours as needed. 12 tablet 0   pantoprazole (PROTONIX) 40 MG tablet Take 1 tablet (40 mg total) by mouth 2 (two) times daily. 60 tablet 2   pregabalin (LYRICA) 150 MG capsule Take 1 capsule (150 mg total) by mouth 2 (two) times daily. 180 capsule 1   SENNA-PLUS 8.6-50 MG tablet TAKE ONE TABLET BY MOUTH AT BEDTIME. 90 tablet 3    sevelamer carbonate (RENVELA) 800 MG tablet Take 800 mg by mouth 3 (three) times daily.     sucralfate (CARAFATE) 1 g tablet Take 1 tablet (1 g total) by mouth 2 (two) times daily for 26 days. 52 tablet 0   torsemide (DEMADEX) 20 MG tablet TAKE (2) TABLETS BY MOUTH TWICE DAILY. 360 tablet 1   triamcinolone cream (KENALOG) 0.1 % APPLY TO AFFECTED AREA TWICE DAILY (Patient taking differently: Apply 1 application topically 2 (two) times daily as needed (rash).) 80 g 0   ULTICARE SHORT PEN NEEDLES 31G X 8 MM MISC USE THREE TIMES DAILY WITH INSULIN 100 each 0   valACYclovir (VALTREX) 500 MG tablet TAKE (1) TABLET BY MOUTH EVERY OTHER DAY. 45 tablet 1   Vitamin D, Ergocalciferol, (DRISDOL) 1.25 MG (50000 UNIT) CAPS capsule TAKE 1 CAPSULE BY MOUTH ONCE A MONTH 1 capsule 11   [DISCONTINUED] gabapentin (NEURONTIN) 100 MG capsule Take 1 capsule (100 mg total) by mouth 3 (three) times daily. 90 capsule 1   No current facility-administered medications on file prior to visit.    Allergies  Allergen Reactions   Morphine Itching    Tolerated hydromorphone on 07/2020   Shellfish Allergy Anaphylaxis and Swelling   Diazepam Other (See Comments)    "felt like out of body experience"    Family History  Problem Relation Age of Onset   Colon cancer Mother    Heart attack Other    Heart attack Maternal Grandmother    Hypertension Sister    Hypertension Brother  Diabetes Paternal Grandmother    Breast cancer Neg Hx     BP (!) 148/60    Pulse 93    Ht _0  (1.727 m)    Wt 285 lb 6.4 oz (129.5 kg)    SpO2 94%    BMI 43.39 kg/m   Review of Systems She denies hypoglycemia.      Objective:   Physical Exam    Lab Results  Component Value Date   HGBA1C 10.7 (A) 07/23/2021      Assessment & Plan:  Insulin-requiring type 2 DM: uncontrolled  Patient Instructions  We will need to take this complex situation in stages.  Please increase the Levemir to 160 units each morning. On this type of  insulin schedule, you should eat meals on a regular schedule.  If a meal is missed or significantly delayed, your blood sugar could go low. check your blood sugar twice a day.  vary the time of day when you check, between before the 3 meals, and at bedtime.  also check if you have symptoms of your blood sugar being too high or too low.  please keep a record of the readings and bring it to your next appointment here (or you can bring the meter itself).  You can write it on any piece of paper.  please call us sooner if your blood sugar goes below 70, or if most of your readings are over 200.   Please come back for a follow-up appointment in 2 weeks.

## 2021-07-23 NOTE — Patient Instructions (Addendum)
We will need to take this complex situation in stages.  Please increase the Levemir to 160 units each morning. On this type of insulin schedule, you should eat meals on a regular schedule.  If a meal is missed or significantly delayed, your blood sugar could go low. check your blood sugar twice a day.  vary the time of day when you check, between before the 3 meals, and at bedtime.  also check if you have symptoms of your blood sugar being too high or too low.  please keep a record of the readings and bring it to your next appointment here (or you can bring the meter itself).  You can write it on any piece of paper.  please call us sooner if your blood sugar goes below 70, or if most of your readings are over 200.   Please come back for a follow-up appointment in 2 weeks.

## 2021-07-24 IMAGING — US US ABDOMEN COMPLETE W/ ELASTOGRAPHY
1 series · 13 of 25 positions shown · non-contrast
Comparison: CT abdomen 05/09/2018

CLINICAL DATA: Chronic hepatitis-C



[Series 1: us abdomen complete w/ elastography · 13 of 87 slices shown]
[im 1/87]
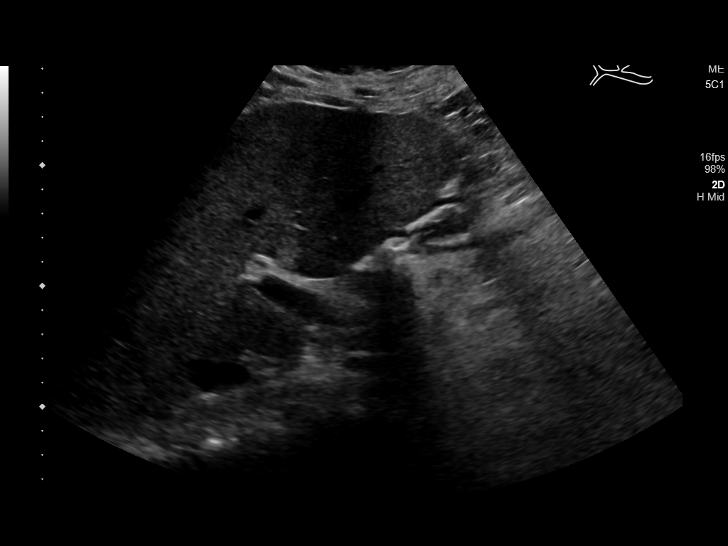
[im 8/87]
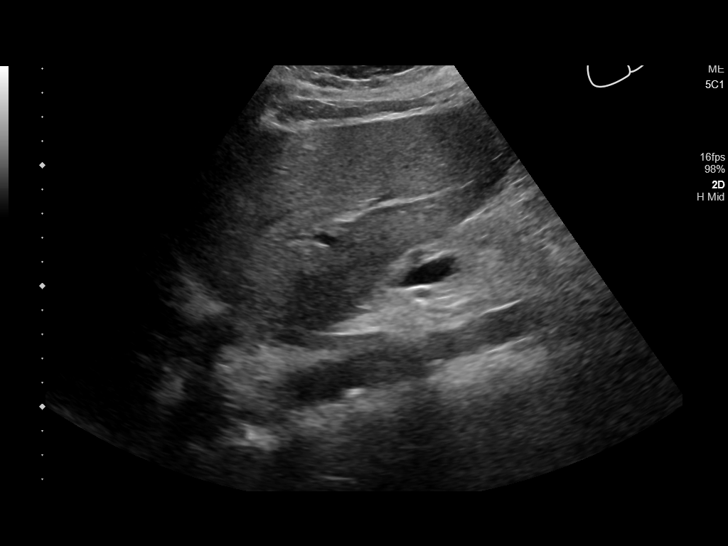
[im 15/87]
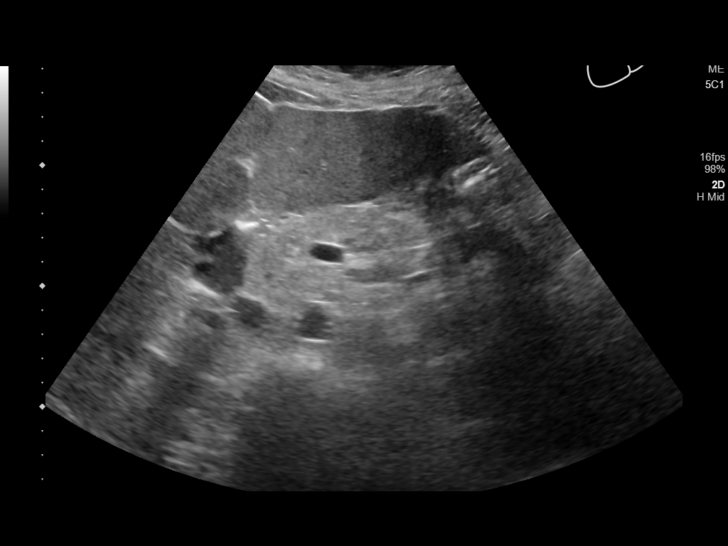
[im 22/87]
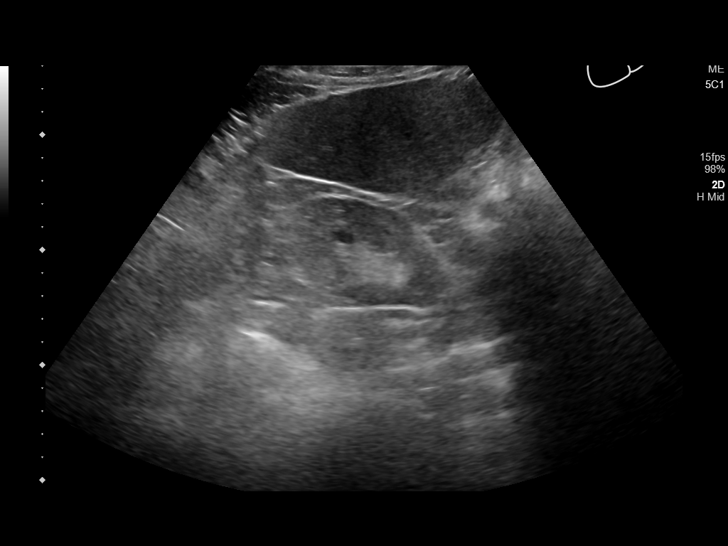
[im 29/87]
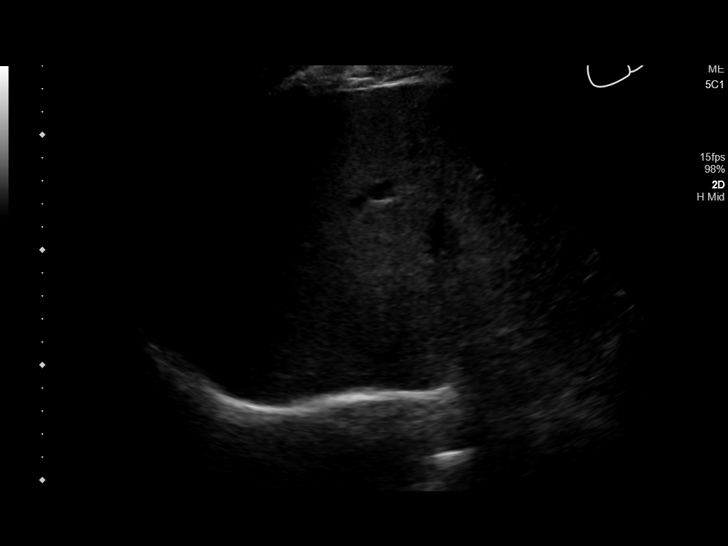
[im 36/87]
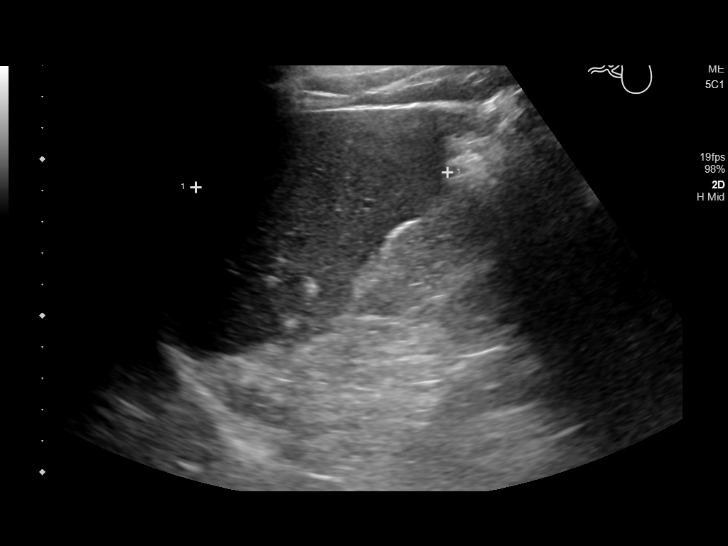
[im 44/87]
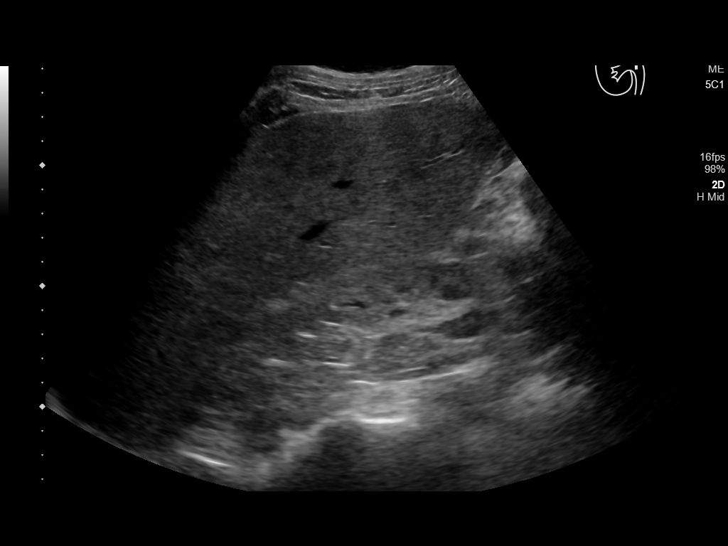
[im 51/87]
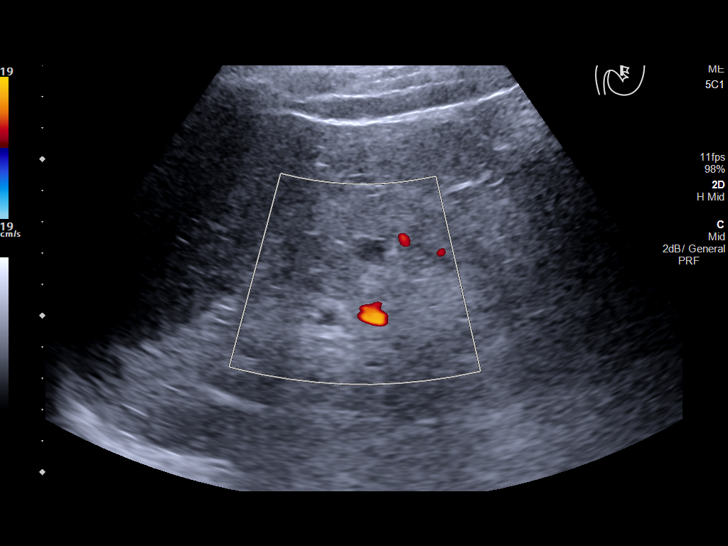
[im 58/87]
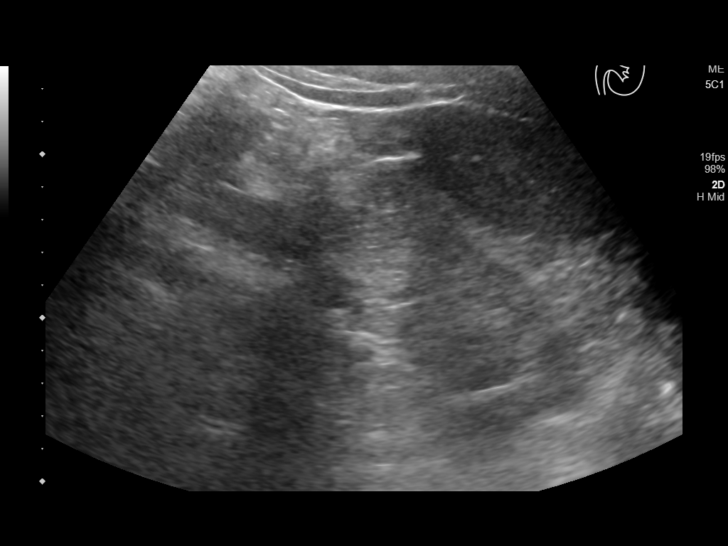
[im 65/87]
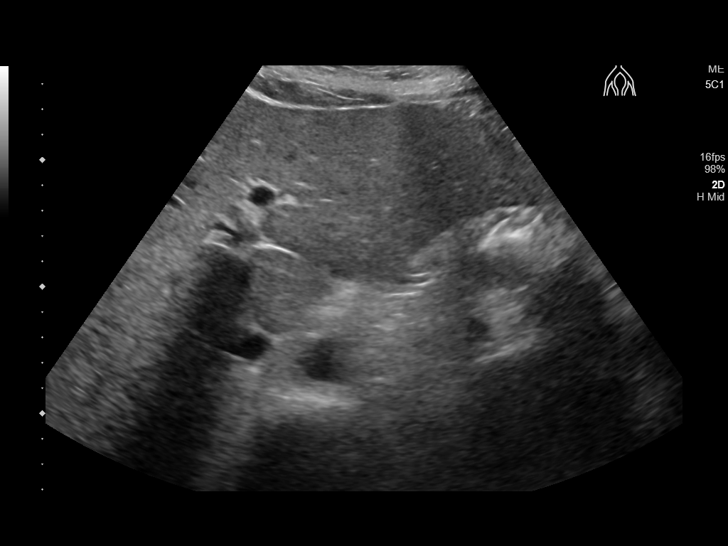
[im 72/87]
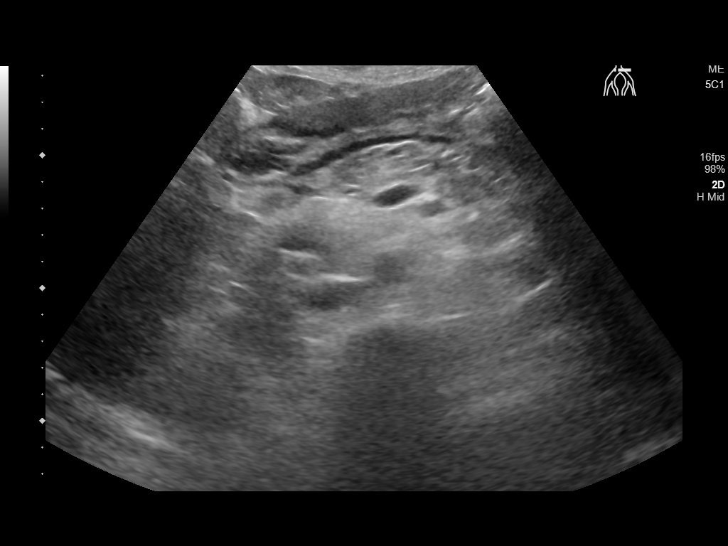
[im 79/87]
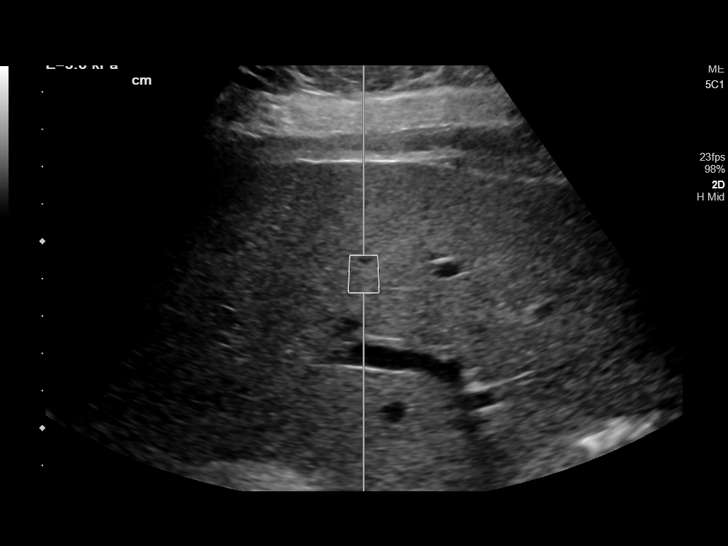
[im 87/87]
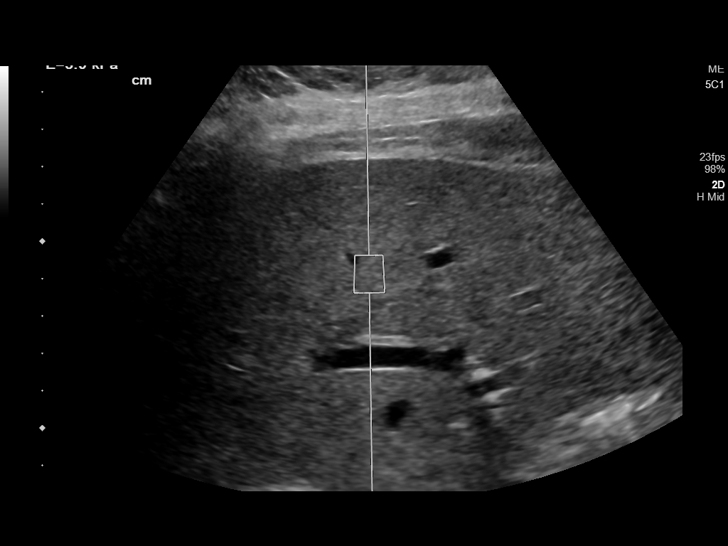

[13 of 25 positions shown; findings below may reference images not displayed]

FINDINGS: ULTRASOUND ABDOMEN

Gallbladder: Postcholecystectomy

Common bile duct: Diameter: Normal at 4 mm

Liver: No focal lesion identified. Within normal limits in
parenchymal echogenicity. Portal vein is patent on color Doppler
imaging with normal direction of blood flow towards the liver.

IVC: No abnormality visualized.

Pancreas: Visualized portion unremarkable.

Spleen: Normal 8 cm.

Right Kidney: Length: 10.9 cm. Echogenicity within normal limits. No
mass or hydronephrosis visualized.

Left Kidney: Length: 10.1 cm.  1.5 cm anechoic cyst.

Abdominal aorta: No aneurysm visualized.

Other findings: No ascites

ULTRASOUND HEPATIC ELASTOGRAPHY

Device: Siemens Helix VTQ

Patient position: Supine

Transducer 5C1

Number of measurements: 10

Hepatic segment:  8

Median velocity:   1.09 m/sec

IQR:

IQR/Median velocity ratio:

Corresponding Metavir fibrosis score:  F0/F1

Risk of fibrosis: Minimal

Limitations of exam: None

Please note that abnormal shear wave velocities may also be
identified in clinical settings other than with hepatic fibrosis,
such as: acute hepatitis, elevated right heart and central venous
pressures including use of beta blockers, Geir-Helge disease
([place]), infiltrative processes such as
mastocytosis/amyloidosis/infiltrative tumor, extrahepatic
cholestasis, in the post-prandial state, and liver transplantation.
Correlation with patient history, laboratory data, and clinical
condition recommended.
IMPRESSION: ULTRASOUND ABDOMEN: Normal hepatic ultrasound.  Postcholecystectomy.

ULTRASOUND HEPATIC ELASTOGRAPHY:

Median hepatic shear wave velocity is calculated at 1.09 m/sec.

Corresponding Metavir fibrosis score is F0/F1.

Risk of fibrosis is Minimal.

Follow-up: None required

## 2021-08-11 ENCOUNTER — Encounter: Payer: Self-pay | Admitting: Endocrinology

## 2021-08-11 ENCOUNTER — Ambulatory Visit (INDEPENDENT_AMBULATORY_CARE_PROVIDER_SITE_OTHER): Payer: Medicare HMO | Admitting: Endocrinology

## 2021-08-11 ENCOUNTER — Other Ambulatory Visit: Payer: Self-pay

## 2021-08-11 VITALS — BP 132/60 | HR 103 | Ht 68.0 in | Wt 256.0 lb

## 2021-08-11 DIAGNOSIS — E1100 Type 2 diabetes mellitus with hyperosmolarity without nonketotic hyperglycemic-hyperosmolar coma (NKHHC): Secondary | ICD-10-CM

## 2021-08-11 LAB — TSH: TSH: 1.7 u[IU]/mL (ref 0.35–5.50)

## 2021-08-11 LAB — T4, FREE: Free T4: 0.82 ng/dL (ref 0.60–1.60)

## 2021-08-11 NOTE — Patient Instructions (Addendum)
We will need to take this complex situation in stages.   Please continue the same Levemir for now.   On this type of insulin schedule, you should eat meals on a regular schedule.  If a meal is missed or significantly delayed, your blood sugar could go low.   check your blood sugar twice a day.  vary the time of day when you check, between before the 3 meals, and at bedtime.  also check if you have symptoms of your blood sugar being too high or too low.  please keep a record of the readings and bring it to your next appointment here (or you can bring the meter itself).  You can write it on any piece of paper.  please call us sooner if your blood sugar goes below 70, or if most of your readings are over 200.   Blood tests are requested for you today.  We'll let you know about the results.    If it is good, we'll clear you for the surgery.  If it is high, we'll increase the insulin, and please come back for a follow-up appointment in 2 weeks.

## 2021-08-11 NOTE — Progress Notes (Signed)
Subjective:    Patient ID: [name], female    DOB: January 12, 1956, 66 y.o.   MRN: [id number]  HPI Pt returns for f/u of diabetes mellitus: DM type: Insulin-requiring type 2 Dx'ed: 8786 Complications: ESRD (on HD), PN, CAD, PAD, and peroneal necrotizing fasciitis.  Therapy: insulin since 2002 GDM: never DKA: never Severe hypoglycemia: never Pancreatitis: 2002 Pancreatic imaging: normal on 2022 CT.   SDOH: none Other: she takes QD insulin, after noncompliance with multiple daily injections.   Interval history: Pt says she never misses the insulin.  she brings her meter with her cbg's which I have reviewed today.  Cbg varies from 95-302.  She wants to be cleared for abd hernia surgery.   Past Medical History:  Diagnosis Date   Anemia    Aortic stenosis    Echo 8/18: mean 13, peak 28, LVOT/AV mean velocity 0.51   Arthritis    Asthma    As a child    Bronchitis    CAD (coronary artery disease)    a. 09/2016: 50% Ost 1st Mrg stenosis, 50% 2nd Mrg stenosis, 20% Mid-Cx, 95% Prox LAD, 40% mid-LAD, and 10% dist-LAD stenosis. Staged PCI with DES to Prox-LAD.    Chronic combined systolic and diastolic CHF (congestive heart failure) ([place]) 2011   echo 2/18: EF 55-60, normal wall motion, grade 2 diastolic dysfunction, trivial AI // echo 3/18: Septal and apical HK, EF 45-50, normal wall motion, trivial AI, mild LAE, PASP 38 // echo 8/18: EF 60-65, normal wall motion, grade 1 diastolic dysfunction, calcified aortic valve leaflets, mild aortic stenosis (mean 13, peak 28, LVOT/AV mean velocity 0.51), mild AI, moderate MAC, mild LAE, trivial TR    Chronic kidney disease    STAGE 4   Chronic kidney disease on chronic dialysis (HCC)    t, th, sat   Complication of anesthesia    Depression    Diabetes mellitus Dx 1989   Elevated lipids    GERD (gastroesophageal reflux disease)    Gout    Heart murmur    asymptomatic   Hepatitis C Dx 2013   Hypertension Dx 1989   Infected surgical wound     Lt arm   Myocardial infarction ([place]) 07/2015   Obesity    Pancreatitis 2013   Pneumonia    Refusal of blood transfusions as patient is Jehovah's Witness    pt states she is not [place] witness and does not refuse blood products   Tendinitis    Tremors of nervous system    LEFT HAND   Ulcer 2010    Past Surgical History:  Procedure Laterality Date   A/V FISTULAGRAM Left 04/11/2019   Procedure: A/V FISTULAGRAM;  Surgeon: Katha Cabal, MD;  Location: [facility];  Service: Cardiovascular;  Laterality: Left;   A/V FISTULAGRAM Left 06/02/2019   Procedure: A/V FISTULAGRAM;  Surgeon: Katha Cabal, MD;  Location: [facility];  Service: Cardiovascular;  Laterality: Left;   APPLICATION OF WOUND VAC Left 08/14/2017   Procedure: APPLICATION OF WOUND VAC Exchange;  Surgeon: Robert Bellow, MD;  Location: [facility];  Service: General;  Laterality: Left;   APPLICATION OF WOUND VAC Left 12/21/2018   Procedure: APPLICATION OF WOUND VAC;  Surgeon: Katha Cabal, MD;  Location: [facility];  Service: Vascular;  Laterality: Left;   AV FISTULA PLACEMENT Left 08/19/2018   Procedure: ARTERIOVENOUS (AV) FISTULA CREATION ( BRACHIOBASILIC );  Surgeon: Katha Cabal, MD;  Location: [facility];  Service: Vascular;  Laterality: Left;   BASCILIC VEIN TRANSPOSITION Left 11/18/2018   Procedure: BASCILIC VEIN TRANSPOSITION;  Surgeon: Katha Cabal, MD;  Location: [facility];  Service: Vascular;  Laterality: Left;   BIOPSY  09/20/2020   Procedure: BIOPSY;  Surgeon: Carol Ada, MD;  Location: [facility];  Service: Endoscopy;;   CHOLECYSTECTOMY     COLONOSCOPY WITH PROPOFOL N/A 02/03/2018   Procedure: COLONOSCOPY WITH PROPOFOL;  Surgeon: Lin Landsman, MD;  Location: [facility];  Service: Gastroenterology;  Laterality: N/A;   CORONARY ANGIOPLASTY  07/2015   STENT   CORONARY STENT INTERVENTION N/A 09/18/2016   Procedure: Coronary Stent Intervention;  Surgeon: Troy Sine,  MD;  Location: [facility];  Service: Cardiovascular;  Laterality: N/A;   DIALYSIS/PERMA CATHETER INSERTION N/A 05/10/2018   Procedure: DIALYSIS/PERMA CATHETER INSERTION;  Surgeon: Katha Cabal, MD;  Location: [facility];  Service: Cardiovascular;  Laterality: N/A;   DRESSING CHANGE UNDER ANESTHESIA Left 08/15/2017   Procedure: exploration of wound for bleeding;  Surgeon: Robert Bellow, MD;  Location: [facility];  Service: General;  Laterality: Left;   ESOPHAGOGASTRODUODENOSCOPY (EGD) WITH PROPOFOL N/A 02/03/2018   Procedure: ESOPHAGOGASTRODUODENOSCOPY (EGD) WITH PROPOFOL;  Surgeon: Lin Landsman, MD;  Location: [facility];  Service: Gastroenterology;  Laterality: N/A;   ESOPHAGOGASTRODUODENOSCOPY (EGD) WITH PROPOFOL N/A 09/20/2020   Procedure: ESOPHAGOGASTRODUODENOSCOPY (EGD) WITH PROPOFOL;  Surgeon: Carol Ada, MD;  Location: [facility];  Service: Endoscopy;  Laterality: N/A;   EYE SURGERY  11/17/2018   INCISION AND DRAINAGE ABSCESS Left 08/12/2017   Procedure: INCISION AND DRAINAGE ABSCESS;  Surgeon: Robert Bellow, MD;  Location: [facility];  Service: General;  Laterality: Left;   KNEE ARTHROSCOPY     LEFT HEART CATH N/A 09/18/2016   Procedure: Left Heart Cath;  Surgeon: Troy Sine, MD;  Location: [facility];  Service: Cardiovascular;  Laterality: N/A;   LEFT HEART CATH AND CORONARY ANGIOGRAPHY N/A 09/16/2016   Procedure: Left Heart Cath and Coronary Angiography;  Surgeon: Burnell Blanks, MD;  Location: [facility];  Service: Cardiovascular;  Laterality: N/A;   LEFT HEART CATH AND CORONARY ANGIOGRAPHY N/A 04/29/2017   Procedure: LEFT HEART CATH AND CORONARY ANGIOGRAPHY;  Surgeon: Nelva Bush, MD;  Location: [facility];  Service: Cardiovascular;  Laterality: N/A;   LOWER EXTREMITY ANGIOGRAPHY Right 03/08/2018   Procedure: LOWER EXTREMITY ANGIOGRAPHY;  Surgeon: Katha Cabal, MD;  Location: [facility];   Service: Cardiovascular;  Laterality: Right;   TUBAL LIGATION     TUBAL LIGATION     UPPER EXTREMITY ANGIOGRAPHY Right 09/19/2019   Procedure: UPPER EXTREMITY ANGIOGRAPHY;  Surgeon: Katha Cabal, MD;  Location: [facility];  Service: Cardiovascular;  Laterality: Right;   WOUND DEBRIDEMENT Left 12/21/2018   Procedure: DEBRIDEMENT WOUND;  Surgeon: Katha Cabal, MD;  Location: [facility];  Service: Vascular;  Laterality: Left;   WOUND DEBRIDEMENT Left 12/30/2018   Procedure: DEBRIDEMENT WOUND WITH VAC PLACEMENT (LEFT UPPER EXTREMITY);  Surgeon: Katha Cabal, MD;  Location: [facility];  Service: Vascular;  Laterality: Left;    Social History   Socioeconomic History   Marital status: Divorced    Spouse name: Not on file   Number of children: 3   Years of education: Not on file   Highest education level: Bachelor's degree (e.g., BA, AB, BS)  Occupational History   Occupation: disability  Tobacco Use   Smoking status: Former  Packs/day: 0.25    Years: 6.00    Pack years: 1.50    Types: Cigarettes    Quit date: 10/25/1980    Years since quitting: 40.8   Smokeless tobacco: Never  Vaping Use   Vaping Use: Never used  Substance and Sexual Activity   Alcohol use: Yes    Comment: rare   Drug use: Not Currently    Types: Marijuana   Sexual activity: Not on file    Comment: Not asked  Other Topics Concern   Not on file  Social History Narrative   Not on file   Social Determinants of Health   Financial Resource Strain: Not on file  Food Insecurity: Not on file  Transportation Needs: Not on file  Physical Activity: Not on file  Stress: Not on file  Social Connections: Not on file  Intimate Partner Violence: Not on file    Current Outpatient Medications on File Prior to Visit  Medication Sig Dispense Refill   albuterol (VENTOLIN HFA) 108 (90 Base) MCG/ACT inhaler Inhale 2 puffs into the lungs every 4 (four) hours as needed for wheezing or shortness of  breath. 18 g 5   allopurinol (ZYLOPRIM) 100 MG tablet TAKE 1 TABLET BY MOUTH ONCE DAILY. 30 tablet 5   aspirin 81 MG EC tablet Take 1 tablet (81 mg total) by mouth daily. 30 tablet 12   atorvastatin (LIPITOR) 80 MG tablet Take 1 tablet (80 mg total) by mouth every evening. 90 tablet 0   AURYXIA 1 GM 210 MG(Fe) tablet Take 2 tablets (420 mg total) by mouth 3 (three) times daily with meals. 540 tablet 3   B Complex-C-Folic Acid (RENA-VITE RX PO) Take 1 mg by mouth daily.     B Complex-C-Folic Acid (RENA-VITE RX) 1 MG TABS Take 1 mg by mouth daily.     carvedilol (COREG) 25 MG tablet Take 1 tablet (25 mg total) by mouth 2 (two) times daily with a meal. 180 tablet 1   Continuous Blood Gluc Receiver (FREESTYLE LIBRE 14 DAY READER) DEVI To check blood sugar ACHS 1 each 0   cyclobenzaprine (FLEXERIL) 10 MG tablet Take 1 tablet (10 mg total) by mouth 2 (two) times daily as needed for muscle spasms. 20 tablet 0   diclofenac Sodium (VOLTAREN) 1 % GEL Apply 2 g topically 4 (four) times daily. 100 g 0   docusate sodium (COLACE) 100 MG capsule Take 1 capsule (100 mg total) by mouth 2 (two) times daily as needed for mild constipation. 30 capsule 0   doxepin (SINEQUAN) 10 MG capsule TAKE 1 CAPSULE(10 MG) BY MOUTH FOUR TIMES DAILY AS NEEDED FOR ITCHING (Patient taking differently: Take 10 mg by mouth 2 (two) times daily.) 360 capsule 3   fluticasone furoate-vilanterol (BREO ELLIPTA) 200-25 MCG/INH AEPB Inhale 1 puff into the lungs daily. 60 each 0   hydrALAZINE (APRESOLINE) 100 MG tablet Take 100 mg by mouth 3 (three) times daily.     hydrOXYzine (ATARAX/VISTARIL) 25 MG tablet Take 1 tablet (25 mg total) by mouth 4 (four) times daily. 360 tablet 1   insulin detemir (LEVEMIR FLEXTOUCH) 100 UNIT/ML FlexPen Inject 160 Units into the skin every morning. And pen needles 1/day 165 mL 3   lidocaine-prilocaine (EMLA) cream Apply 1 application topically as needed. (Patient taking differently: Apply 1 application topically  as needed (dialysis days).) 30 g 11   loratadine (CLARITIN) 10 MG tablet TAKE 1 TABLET BY MOUTH ONCE DAILY. (Patient taking differently: Take 10 mg by mouth  daily as needed for allergies.) 90 tablet 3   montelukast (SINGULAIR) 10 MG tablet TAKE ONE TABLET BY MOUTH AT BEDTIME. (Patient taking differently: Take 10 mg by mouth at bedtime as needed (allergies).) 90 tablet 3   nitroGLYCERIN (NITROSTAT) 0.4 MG SL tablet Place 1 tablet (0.4 mg total) under the tongue every 5 (five) minutes x 3 doses as needed for chest pain. 25 tablet 12   oxyCODONE (ROXICODONE) 5 MG immediate release tablet Take 1 tablet (5 mg total) by mouth every 12 (twelve) hours as needed for severe pain. 60 tablet 0   oxyCODONE-acetaminophen (PERCOCET/ROXICET) 5-325 MG tablet Take 1 tablet by mouth every 8 (eight) hours as needed. 12 tablet 0   pregabalin (LYRICA) 150 MG capsule Take 1 capsule (150 mg total) by mouth 2 (two) times daily. 180 capsule 1   SENNA-PLUS 8.6-50 MG tablet TAKE ONE TABLET BY MOUTH AT BEDTIME. 90 tablet 3   sevelamer carbonate (RENVELA) 800 MG tablet Take 800 mg by mouth 3 (three) times daily.     triamcinolone cream (KENALOG) 0.1 % APPLY TO AFFECTED AREA TWICE DAILY (Patient taking differently: Apply 1 application topically 2 (two) times daily as needed (rash).) 80 g 0   ULTICARE SHORT PEN NEEDLES 31G X 8 MM MISC USE THREE TIMES DAILY WITH INSULIN 100 each 0   valACYclovir (VALTREX) 500 MG tablet TAKE (1) TABLET BY MOUTH EVERY OTHER DAY. 45 tablet 1   Vitamin D, Ergocalciferol, (DRISDOL) 1.25 MG (50000 UNIT) CAPS capsule TAKE 1 CAPSULE BY MOUTH ONCE A MONTH 1 capsule 11   buPROPion (WELLBUTRIN SR) 150 MG 12 hr tablet Take 1 tablet (150 mg total) by mouth 2 (two) times daily. 180 tablet 3   colchicine 0.6 MG tablet Take 1 tablet (0.6 mg total) by mouth once a week for 15 days. 2 tablet 0   Continuous Blood Gluc Sensor (FREESTYLE LIBRE 14 DAY SENSOR) MISC To check blood sugar ACHS; change every 14 days 6 each 11    dicyclomine (BENTYL) 20 MG tablet Take 1 tablet (20 mg total) by mouth in the morning and at bedtime. 180 tablet 1   fluticasone (FLONASE) 50 MCG/ACT nasal spray Place 2 sprays into both nostrils daily as needed for allergies or rhinitis. 48 g 1   Nutritional Supplements (FEEDING SUPPLEMENT, NEPRO CARB STEADY,) LIQD Take 237 mLs by mouth daily. 7110 mL 0   nystatin cream (MYCOSTATIN) Apply 1 application topically 2 (two) times daily. (Patient taking differently: Apply 1 application topically daily as needed for dry skin.) 30 g 0   omeprazole (PRILOSEC) 20 MG capsule Take 20 mg by mouth daily.     pantoprazole (PROTONIX) 40 MG tablet Take 1 tablet (40 mg total) by mouth 2 (two) times daily. 60 tablet 2   sucralfate (CARAFATE) 1 g tablet Take 1 tablet (1 g total) by mouth 2 (two) times daily for 26 days. 52 tablet 0   torsemide (DEMADEX) 20 MG tablet TAKE (2) TABLETS BY MOUTH TWICE DAILY. 360 tablet 1   [DISCONTINUED] gabapentin (NEURONTIN) 100 MG capsule Take 1 capsule (100 mg total) by mouth 3 (three) times daily. 90 capsule 1   No current facility-administered medications on file prior to visit.    Allergies  Allergen Reactions   Morphine Itching    Tolerated hydromorphone on 07/2020   Shellfish Allergy Anaphylaxis and Swelling   Diazepam Other (See Comments)    "felt like out of body experience"    Family History  Problem Relation Age of  Onset   Colon cancer Mother    Heart attack Other    Heart attack Maternal Grandmother    Hypertension Sister    Hypertension Brother    Diabetes Paternal Grandmother    Breast cancer Neg Hx     BP 132/60 (BP Location: Left Arm, Patient Position: Sitting, Cuff Size: Normal)    Pulse (!) 103    Ht _0  (1.727 m)    Wt 256 lb (116.1 kg)    SpO2 93%    BMI 38.92 kg/m    Review of Systems She denies hypoglycemia    Objective:   Physical Exam VITAL SIGNS:  See vs page GENERAL: no distress.  In [place].       Assessment & Plan:   Insulin-requiring type 2 DM CRI: check fructosamine  Patient Instructions  We will need to take this complex situation in stages.   Please continue the same Levemir for now.   On this type of insulin schedule, you should eat meals on a regular schedule.  If a meal is missed or significantly delayed, your blood sugar could go low.   check your blood sugar twice a day.  vary the time of day when you check, between before the 3 meals, and at bedtime.  also check if you have symptoms of your blood sugar being too high or too low.  please keep a record of the readings and bring it to your next appointment here (or you can bring the meter itself).  You can write it on any piece of paper.  please call us sooner if your blood sugar goes below 70, or if most of your readings are over 200.   Blood tests are requested for you today.  We'll let you know about the results.    If it is good, we'll clear you for the surgery.  If it is high, we'll increase the insulin, and please come back for a follow-up appointment in 2 weeks.

## 2021-08-14 ENCOUNTER — Telehealth: Payer: Self-pay

## 2021-08-14 LAB — FRUCTOSAMINE: Fructosamine: 522 umol/L — ABNORMAL HIGH (ref 205–285)

## 2021-08-14 NOTE — Telephone Encounter (Signed)
Pt called inquiring about her lab results and wanted to know if there is anything that needs to be done whether her dosage needed to be increased.

## 2021-08-15 NOTE — Telephone Encounter (Signed)
Patient has now been informed of result note left on 1/30 by Dr. Loanne Drilling: Hi Ms. Byland: This is like an A1c of approx 10.5%, which is high.  Please increase the insulin to 180 units each morning.  Please come back for a follow-up appointment in 2 weeks.  I hope this helps.  Patient expressed understanding and will adjust insulin dosage accordingly.

## 2021-08-18 ENCOUNTER — Telehealth: Payer: Self-pay

## 2021-08-18 NOTE — Telephone Encounter (Signed)
LVM for pt to return cb to the office regarding VM that she left.

## 2021-08-22 ENCOUNTER — Telehealth: Payer: Self-pay

## 2021-08-22 NOTE — Telephone Encounter (Signed)
Please contact pt to reschedule upcomong appt.

## 2021-08-25 ENCOUNTER — Ambulatory Visit: Payer: Medicare HMO | Admitting: Endocrinology

## 2021-09-05 ENCOUNTER — Ambulatory Visit (INDEPENDENT_AMBULATORY_CARE_PROVIDER_SITE_OTHER): Payer: Medicare HMO | Admitting: Endocrinology

## 2021-09-05 ENCOUNTER — Other Ambulatory Visit: Payer: Self-pay

## 2021-09-05 VITALS — BP 130/42 | HR 98 | Ht 68.0 in | Wt 269.6 lb

## 2021-09-05 DIAGNOSIS — E1100 Type 2 diabetes mellitus with hyperosmolarity without nonketotic hyperglycemic-hyperosmolar coma (NKHHC): Secondary | ICD-10-CM | POA: Diagnosis not present

## 2021-09-05 MED ORDER — NOVOLIN N FLEXPEN 100 UNIT/ML ~~LOC~~ SUPN: 140.0000 [IU] | PEN_INJECTOR | SUBCUTANEOUS | 3 refills | Status: DC

## 2021-09-05 NOTE — Progress Notes (Signed)
Subjective:    Patient ID: [name], female    DOB: 1956-04-21, 66 y.o.   MRN: [id number]  HPI Pt returns for f/u of diabetes mellitus: DM type: Insulin-requiring type 2 Dx'ed: 0932 Complications: ESRD (on HD), PN, CAD, PAD, and peroneal necrotizing fasciitis.  Therapy: insulin since 2002 GDM: never DKA: never Severe hypoglycemia: never Pancreatitis: 2002 Pancreatic imaging: normal on 2022 CT.   SDOH: none Other: she takes QD insulin, after noncompliance with multiple daily injections.   Interval history: Pt says she never misses the insulin.  She takes 190 units qam.  she brings her meter with her cbg's which I have reviewed today.  Cbg varies from 72-421.  It is in general higher as the day goes on.  She wants to be cleared for abd hernia surgery.   Past Medical History:  Diagnosis Date   Anemia    Aortic stenosis    Echo 8/18: mean 13, peak 28, LVOT/AV mean velocity 0.51   Arthritis    Asthma    As a child    Bronchitis    CAD (coronary artery disease)    a. 09/2016: 50% Ost 1st Mrg stenosis, 50% 2nd Mrg stenosis, 20% Mid-Cx, 95% Prox LAD, 40% mid-LAD, and 10% dist-LAD stenosis. Staged PCI with DES to Prox-LAD.    Chronic combined systolic and diastolic CHF (congestive heart failure) ([place]) 2011   echo 2/18: EF 55-60, normal wall motion, grade 2 diastolic dysfunction, trivial AI // echo 3/18: Septal and apical HK, EF 45-50, normal wall motion, trivial AI, mild LAE, PASP 38 // echo 8/18: EF 60-65, normal wall motion, grade 1 diastolic dysfunction, calcified aortic valve leaflets, mild aortic stenosis (mean 13, peak 28, LVOT/AV mean velocity 0.51), mild AI, moderate MAC, mild LAE, trivial TR    Chronic kidney disease    STAGE 4   Chronic kidney disease on chronic dialysis (HCC)    t, th, sat   Complication of anesthesia    Depression    Diabetes mellitus Dx 1989   Elevated lipids    GERD (gastroesophageal reflux disease)    Gout    Heart murmur    asymptomatic    Hepatitis C Dx 2013   Hypertension Dx 1989   Infected surgical wound    Lt arm   Myocardial infarction ([place]) 07/2015   Obesity    Pancreatitis 2013   Pneumonia    Refusal of blood transfusions as patient is Jehovah's Witness    pt states she is not [place] witness and does not refuse blood products   Tendinitis    Tremors of nervous system    LEFT HAND   Ulcer 2010    Past Surgical History:  Procedure Laterality Date   A/V FISTULAGRAM Left 04/11/2019   Procedure: A/V FISTULAGRAM;  Surgeon: Katha Cabal, MD;  Location: [place] [facility];  Service: Cardiovascular;  Laterality: Left;   A/V FISTULAGRAM Left 06/02/2019   Procedure: A/V FISTULAGRAM;  Surgeon: Katha Cabal, MD;  Location: [place] [facility];  Service: Cardiovascular;  Laterality: Left;   APPLICATION OF WOUND VAC Left 08/14/2017   Procedure: APPLICATION OF WOUND VAC Exchange;  Surgeon: Robert Bellow, MD;  Location: [facility];  Service: General;  Laterality: Left;   APPLICATION OF WOUND VAC Left 12/21/2018   Procedure: APPLICATION OF WOUND VAC;  Surgeon: Katha Cabal, MD;  Location: [facility];  Service: Vascular;  Laterality: Left;   AV FISTULA PLACEMENT Left 08/19/2018   Procedure: ARTERIOVENOUS (  AV) FISTULA CREATION ( BRACHIOBASILIC );  Surgeon: Katha Cabal, MD;  Location: [facility];  Service: Vascular;  Laterality: Left;   BASCILIC VEIN TRANSPOSITION Left 11/18/2018   Procedure: BASCILIC VEIN TRANSPOSITION;  Surgeon: Katha Cabal, MD;  Location: [facility];  Service: Vascular;  Laterality: Left;   BIOPSY  09/20/2020   Procedure: BIOPSY;  Surgeon: Carol Ada, MD;  Location: [place];  Service: Endoscopy;;   CHOLECYSTECTOMY     COLONOSCOPY WITH PROPOFOL N/A 02/03/2018   Procedure: COLONOSCOPY WITH PROPOFOL;  Surgeon: Lin Landsman, MD;  Location: [facility];  Service: Gastroenterology;  Laterality: N/A;   CORONARY ANGIOPLASTY  07/2015   STENT   CORONARY STENT INTERVENTION N/A  09/18/2016   Procedure: Coronary Stent Intervention;  Surgeon: Troy Sine, MD;  Location: [place] [facility];  Service: Cardiovascular;  Laterality: N/A;   DIALYSIS/PERMA CATHETER INSERTION N/A 05/10/2018   Procedure: DIALYSIS/PERMA CATHETER INSERTION;  Surgeon: Katha Cabal, MD;  Location: [place] [facility];  Service: Cardiovascular;  Laterality: N/A;   DRESSING CHANGE UNDER ANESTHESIA Left 08/15/2017   Procedure: exploration of wound for bleeding;  Surgeon: Robert Bellow, MD;  Location: [facility];  Service: General;  Laterality: Left;   ESOPHAGOGASTRODUODENOSCOPY (EGD) WITH PROPOFOL N/A 02/03/2018   Procedure: ESOPHAGOGASTRODUODENOSCOPY (EGD) WITH PROPOFOL;  Surgeon: Lin Landsman, MD;  Location: [facility];  Service: Gastroenterology;  Laterality: N/A;   ESOPHAGOGASTRODUODENOSCOPY (EGD) WITH PROPOFOL N/A 09/20/2020   Procedure: ESOPHAGOGASTRODUODENOSCOPY (EGD) WITH PROPOFOL;  Surgeon: Carol Ada, MD;  Location: [place];  Service: Endoscopy;  Laterality: N/A;   EYE SURGERY  11/17/2018   INCISION AND DRAINAGE ABSCESS Left 08/12/2017   Procedure: INCISION AND DRAINAGE ABSCESS;  Surgeon: Robert Bellow, MD;  Location: [facility];  Service: General;  Laterality: Left;   KNEE ARTHROSCOPY     LEFT HEART CATH N/A 09/18/2016   Procedure: Left Heart Cath;  Surgeon: Troy Sine, MD;  Location: [place] [facility];  Service: Cardiovascular;  Laterality: N/A;   LEFT HEART CATH AND CORONARY ANGIOGRAPHY N/A 09/16/2016   Procedure: Left Heart Cath and Coronary Angiography;  Surgeon: Burnell Blanks, MD;  Location: [place] [facility];  Service: Cardiovascular;  Laterality: N/A;   LEFT HEART CATH AND CORONARY ANGIOGRAPHY N/A 04/29/2017   Procedure: LEFT HEART CATH AND CORONARY ANGIOGRAPHY;  Surgeon: Nelva Bush, MD;  Location: [place] [facility];  Service: Cardiovascular;  Laterality: N/A;   LOWER EXTREMITY ANGIOGRAPHY Right 03/08/2018   Procedure: LOWER EXTREMITY  ANGIOGRAPHY;  Surgeon: Katha Cabal, MD;  Location: [place] [facility];  Service: Cardiovascular;  Laterality: Right;   TUBAL LIGATION     TUBAL LIGATION     UPPER EXTREMITY ANGIOGRAPHY Right 09/19/2019   Procedure: UPPER EXTREMITY ANGIOGRAPHY;  Surgeon: Katha Cabal, MD;  Location: [place] [facility];  Service: Cardiovascular;  Laterality: Right;   WOUND DEBRIDEMENT Left 12/21/2018   Procedure: DEBRIDEMENT WOUND;  Surgeon: Katha Cabal, MD;  Location: [facility];  Service: Vascular;  Laterality: Left;   WOUND DEBRIDEMENT Left 12/30/2018   Procedure: DEBRIDEMENT WOUND WITH VAC PLACEMENT (LEFT UPPER EXTREMITY);  Surgeon: Katha Cabal, MD;  Location: [facility];  Service: Vascular;  Laterality: Left;    Social History   Socioeconomic History   Marital status: Divorced    Spouse name: Not on file   Number of children: 3   Years of education: Not on file   Highest education level: Bachelor's degree (e.g., BA, AB, BS)  Occupational History   Occupation: disability  Tobacco Use   Smoking status: Former    Packs/day: 0.25    Years: 6.00    Pack years: 1.50    Types: Cigarettes    Quit date: 10/25/1980    Years since quitting: 40.8   Smokeless tobacco: Never  Vaping Use   Vaping Use: Never used  Substance and Sexual Activity   Alcohol use: Yes    Comment: rare   Drug use: Not Currently    Types: Marijuana   Sexual activity: Not on file    Comment: Not asked  Other Topics Concern   Not on file  Social History Narrative   Not on file   Social Determinants of Health   Financial Resource Strain: Not on file  Food Insecurity: Not on file  Transportation Needs: Not on file  Physical Activity: Not on file  Stress: Not on file  Social Connections: Not on file  Intimate Partner Violence: Not on file    Current Outpatient Medications on File Prior to Visit  Medication Sig Dispense Refill   albuterol (VENTOLIN HFA) 108 (90 Base) MCG/ACT inhaler Inhale 2 puffs  into the lungs every 4 (four) hours as needed for wheezing or shortness of breath. 18 g 5   allopurinol (ZYLOPRIM) 100 MG tablet TAKE 1 TABLET BY MOUTH ONCE DAILY. 30 tablet 5   aspirin 81 MG EC tablet Take 1 tablet (81 mg total) by mouth daily. 30 tablet 12   atorvastatin (LIPITOR) 80 MG tablet Take 1 tablet (80 mg total) by mouth every evening. 90 tablet 0   AURYXIA 1 GM 210 MG(Fe) tablet Take 2 tablets (420 mg total) by mouth 3 (three) times daily with meals. 540 tablet 3   B Complex-C-Folic Acid (RENA-VITE RX PO) Take 1 mg by mouth daily.     B Complex-C-Folic Acid (RENA-VITE RX) 1 MG TABS Take 1 mg by mouth daily.     carvedilol (COREG) 25 MG tablet Take 1 tablet (25 mg total) by mouth 2 (two) times daily with a meal. 180 tablet 1   Continuous Blood Gluc Receiver (FREESTYLE LIBRE 14 DAY READER) DEVI To check blood sugar ACHS 1 each 0   cyclobenzaprine (FLEXERIL) 10 MG tablet Take 1 tablet (10 mg total) by mouth 2 (two) times daily as needed for muscle spasms. 20 tablet 0   diclofenac Sodium (VOLTAREN) 1 % GEL Apply 2 g topically 4 (four) times daily. 100 g 0   docusate sodium (COLACE) 100 MG capsule Take 1 capsule (100 mg total) by mouth 2 (two) times daily as needed for mild constipation. 30 capsule 0   doxepin (SINEQUAN) 10 MG capsule TAKE 1 CAPSULE(10 MG) BY MOUTH FOUR TIMES DAILY AS NEEDED FOR ITCHING (Patient taking differently: Take 10 mg by mouth 2 (two) times daily.) 360 capsule 3   fluticasone furoate-vilanterol (BREO ELLIPTA) 200-25 MCG/INH AEPB Inhale 1 puff into the lungs daily. 60 each 0   hydrALAZINE (APRESOLINE) 100 MG tablet Take 100 mg by mouth 3 (three) times daily.     hydrOXYzine (ATARAX/VISTARIL) 25 MG tablet Take 1 tablet (25 mg total) by mouth 4 (four) times daily. 360 tablet 1   lidocaine-prilocaine (EMLA) cream Apply 1 application topically as needed. (Patient taking differently: Apply 1 application topically as needed (dialysis days).) 30 g 11   loratadine (CLARITIN)  10 MG tablet TAKE 1 TABLET BY MOUTH ONCE DAILY. (Patient taking differently: Take 10 mg by mouth daily as needed for allergies.) 90 tablet  3   montelukast (SINGULAIR) 10 MG tablet TAKE ONE TABLET BY MOUTH AT BEDTIME. (Patient taking differently: Take 10 mg by mouth at bedtime as needed (allergies).) 90 tablet 3   nitroGLYCERIN (NITROSTAT) 0.4 MG SL tablet Place 1 tablet (0.4 mg total) under the tongue every 5 (five) minutes x 3 doses as needed for chest pain. 25 tablet 12   oxyCODONE (ROXICODONE) 5 MG immediate release tablet Take 1 tablet (5 mg total) by mouth every 12 (twelve) hours as needed for severe pain. 60 tablet 0   oxyCODONE-acetaminophen (PERCOCET/ROXICET) 5-325 MG tablet Take 1 tablet by mouth every 8 (eight) hours as needed. 12 tablet 0   pregabalin (LYRICA) 150 MG capsule Take 1 capsule (150 mg total) by mouth 2 (two) times daily. 180 capsule 1   SENNA-PLUS 8.6-50 MG tablet TAKE ONE TABLET BY MOUTH AT BEDTIME. 90 tablet 3   triamcinolone cream (KENALOG) 0.1 % APPLY TO AFFECTED AREA TWICE DAILY (Patient taking differently: Apply 1 application topically 2 (two) times daily as needed (rash).) 80 g 0   ULTICARE SHORT PEN NEEDLES 31G X 8 MM MISC USE THREE TIMES DAILY WITH INSULIN 100 each 0   valACYclovir (VALTREX) 500 MG tablet TAKE (1) TABLET BY MOUTH EVERY OTHER DAY. 45 tablet 1   Vitamin D, Ergocalciferol, (DRISDOL) 1.25 MG (50000 UNIT) CAPS capsule TAKE 1 CAPSULE BY MOUTH ONCE A MONTH 1 capsule 11   buPROPion (WELLBUTRIN SR) 150 MG 12 hr tablet Take 1 tablet (150 mg total) by mouth 2 (two) times daily. 180 tablet 3   colchicine 0.6 MG tablet Take 1 tablet (0.6 mg total) by mouth once a week for 15 days. 2 tablet 0   Continuous Blood Gluc Sensor (FREESTYLE LIBRE 14 DAY SENSOR) MISC To check blood sugar ACHS; change every 14 days 6 each 11   dicyclomine (BENTYL) 20 MG tablet Take 1 tablet (20 mg total) by mouth in the morning and at bedtime. 180 tablet 1   fluticasone (FLONASE) 50 MCG/ACT  nasal spray Place 2 sprays into both nostrils daily as needed for allergies or rhinitis. 48 g 1   Nutritional Supplements (FEEDING SUPPLEMENT, NEPRO CARB STEADY,) LIQD Take 237 mLs by mouth daily. 7110 mL 0   nystatin cream (MYCOSTATIN) Apply 1 application topically 2 (two) times daily. (Patient taking differently: Apply 1 application topically daily as needed for dry skin.) 30 g 0   omeprazole (PRILOSEC) 20 MG capsule Take 20 mg by mouth daily.     pantoprazole (PROTONIX) 40 MG tablet Take 1 tablet (40 mg total) by mouth 2 (two) times daily. 60 tablet 2   sucralfate (CARAFATE) 1 g tablet Take 1 tablet (1 g total) by mouth 2 (two) times daily for 26 days. 52 tablet 0   torsemide (DEMADEX) 20 MG tablet TAKE (2) TABLETS BY MOUTH TWICE DAILY. 360 tablet 1   [DISCONTINUED] gabapentin (NEURONTIN) 100 MG capsule Take 1 capsule (100 mg total) by mouth 3 (three) times daily. 90 capsule 1   No current facility-administered medications on file prior to visit.      Family History  Problem Relation Age of Onset   Colon cancer Mother    Heart attack Other    Heart attack Maternal Grandmother    Hypertension Sister    Hypertension Brother    Diabetes Paternal Grandmother    Breast cancer Neg Hx     BP (!) 130/42    Pulse 98    Ht _0  (1.727 m)  Wt 269 lb 9.6 oz (122.3 kg)    SpO2 94%    BMI 40.99 kg/m    Review of Systems     Objective:   Physical Exam       Assessment & Plan:  Insulin-requiring type 2 DM: uncontrolled.  Based on the pattern of her cbg's, she needs a faster-acting qam insulin.    Patient Instructions  We will need to take this complex situation in stages.   I have sent a prescription to your pharmacy, to change the Levemir to NPH insulin, 140 units each morning.   On this type of insulin schedule, you should eat meals on a regular schedule.  If a meal is missed or significantly delayed, your blood sugar could go low.   check your blood sugar twice a day.  vary the  time of day when you check, between before the 3 meals, and at bedtime.  also check if you have symptoms of your blood sugar being too high or too low.  please keep a record of the readings and bring it to your next appointment here (or you can bring the meter itself).  You can write it on any piece of paper.  please call us sooner if your blood sugar goes below 70, or if most of your readings are over 200.   please come back for a follow-up appointment in 2 weeks.

## 2021-09-05 NOTE — Patient Instructions (Addendum)
We will need to take this complex situation in stages.   I have sent a prescription to your pharmacy, to change the Levemir to NPH insulin, 140 units each morning.   On this type of insulin schedule, you should eat meals on a regular schedule.  If a meal is missed or significantly delayed, your blood sugar could go low.   check your blood sugar twice a day.  vary the time of day when you check, between before the 3 meals, and at bedtime.  also check if you have symptoms of your blood sugar being too high or too low.  please keep a record of the readings and bring it to your next appointment here (or you can bring the meter itself).  You can write it on any piece of paper.  please call us sooner if your blood sugar goes below 70, or if most of your readings are over 200.   please come back for a follow-up appointment in 2 weeks.

## 2021-09-17 ENCOUNTER — Inpatient Hospital Stay (HOSPITAL_COMMUNITY)
Admission: EM | Admit: 2021-09-17 | Discharge: 2021-09-19 | DRG: 393 | Disposition: A | Discharge status: Home Health | Payer: Medicare Other | Attending: Family Medicine | Admitting: Family Medicine

## 2021-09-17 ENCOUNTER — Encounter (HOSPITAL_COMMUNITY): Payer: Self-pay

## 2021-09-17 ENCOUNTER — Other Ambulatory Visit: Payer: Self-pay

## 2021-09-17 ENCOUNTER — Emergency Department (HOSPITAL_COMMUNITY): Payer: Medicare Other

## 2021-09-17 DIAGNOSIS — E1122 Type 2 diabetes mellitus with diabetic chronic kidney disease: Secondary | ICD-10-CM | POA: Diagnosis present

## 2021-09-17 DIAGNOSIS — M1A39X0 Chronic gout due to renal impairment, multiple sites, without tophus (tophi): Secondary | ICD-10-CM | POA: Diagnosis present

## 2021-09-17 DIAGNOSIS — Z79899 Other long term (current) drug therapy: Secondary | ICD-10-CM

## 2021-09-17 DIAGNOSIS — L299 Pruritus, unspecified: Secondary | ICD-10-CM | POA: Diagnosis present

## 2021-09-17 DIAGNOSIS — G4733 Obstructive sleep apnea (adult) (pediatric): Secondary | ICD-10-CM | POA: Diagnosis present

## 2021-09-17 DIAGNOSIS — I2510 Atherosclerotic heart disease of native coronary artery without angina pectoris: Secondary | ICD-10-CM | POA: Diagnosis not present

## 2021-09-17 DIAGNOSIS — Z9181 History of falling: Secondary | ICD-10-CM

## 2021-09-17 DIAGNOSIS — R0602 Shortness of breath: Secondary | ICD-10-CM | POA: Diagnosis not present

## 2021-09-17 DIAGNOSIS — M1991 Primary osteoarthritis, unspecified site: Secondary | ICD-10-CM | POA: Diagnosis present

## 2021-09-17 DIAGNOSIS — Z833 Family history of diabetes mellitus: Secondary | ICD-10-CM

## 2021-09-17 DIAGNOSIS — Z91119 Patient's noncompliance with dietary regimen due to unspecified reason: Secondary | ICD-10-CM

## 2021-09-17 DIAGNOSIS — Z86010 Personal history of colonic polyps: Secondary | ICD-10-CM

## 2021-09-17 DIAGNOSIS — D649 Anemia, unspecified: Secondary | ICD-10-CM

## 2021-09-17 DIAGNOSIS — E785 Hyperlipidemia, unspecified: Secondary | ICD-10-CM | POA: Diagnosis present

## 2021-09-17 DIAGNOSIS — R011 Cardiac murmur, unspecified: Secondary | ICD-10-CM | POA: Diagnosis present

## 2021-09-17 DIAGNOSIS — Y838 Other surgical procedures as the cause of abnormal reaction of the patient, or of later complication, without mention of misadventure at the time of the procedure: Secondary | ICD-10-CM | POA: Diagnosis present

## 2021-09-17 DIAGNOSIS — D509 Iron deficiency anemia, unspecified: Secondary | ICD-10-CM | POA: Diagnosis present

## 2021-09-17 DIAGNOSIS — Z8619 Personal history of other infectious and parasitic diseases: Secondary | ICD-10-CM

## 2021-09-17 DIAGNOSIS — Z8701 Personal history of pneumonia (recurrent): Secondary | ICD-10-CM

## 2021-09-17 DIAGNOSIS — D631 Anemia in chronic kidney disease: Secondary | ICD-10-CM | POA: Diagnosis present

## 2021-09-17 DIAGNOSIS — Z888 Allergy status to other drugs, medicaments and biological substances status: Secondary | ICD-10-CM

## 2021-09-17 DIAGNOSIS — Z6841 Body Mass Index (BMI) 40.0 and over, adult: Secondary | ICD-10-CM

## 2021-09-17 DIAGNOSIS — E8770 Fluid overload, unspecified: Secondary | ICD-10-CM

## 2021-09-17 DIAGNOSIS — K219 Gastro-esophageal reflux disease without esophagitis: Secondary | ICD-10-CM | POA: Diagnosis present

## 2021-09-17 DIAGNOSIS — N186 End stage renal disease: Secondary | ICD-10-CM | POA: Diagnosis not present

## 2021-09-17 DIAGNOSIS — E1151 Type 2 diabetes mellitus with diabetic peripheral angiopathy without gangrene: Secondary | ICD-10-CM | POA: Diagnosis present

## 2021-09-17 DIAGNOSIS — Z91199 Patient's noncompliance with other medical treatment and regimen due to unspecified reason: Secondary | ICD-10-CM

## 2021-09-17 DIAGNOSIS — Z8249 Family history of ischemic heart disease and other diseases of the circulatory system: Secondary | ICD-10-CM

## 2021-09-17 DIAGNOSIS — I350 Nonrheumatic aortic (valve) stenosis: Secondary | ICD-10-CM | POA: Diagnosis present

## 2021-09-17 DIAGNOSIS — Z8739 Personal history of other diseases of the musculoskeletal system and connective tissue: Secondary | ICD-10-CM

## 2021-09-17 DIAGNOSIS — E1142 Type 2 diabetes mellitus with diabetic polyneuropathy: Secondary | ICD-10-CM | POA: Diagnosis not present

## 2021-09-17 DIAGNOSIS — Z955 Presence of coronary angioplasty implant and graft: Secondary | ICD-10-CM

## 2021-09-17 DIAGNOSIS — Z20822 Contact with and (suspected) exposure to covid-19: Secondary | ICD-10-CM | POA: Diagnosis present

## 2021-09-17 DIAGNOSIS — I132 Hypertensive heart and chronic kidney disease with heart failure and with stage 5 chronic kidney disease, or end stage renal disease: Secondary | ICD-10-CM | POA: Diagnosis present

## 2021-09-17 DIAGNOSIS — K922 Gastrointestinal hemorrhage, unspecified: Secondary | ICD-10-CM

## 2021-09-17 DIAGNOSIS — L509 Urticaria, unspecified: Secondary | ICD-10-CM | POA: Diagnosis present

## 2021-09-17 DIAGNOSIS — J9811 Atelectasis: Secondary | ICD-10-CM | POA: Diagnosis present

## 2021-09-17 DIAGNOSIS — G8929 Other chronic pain: Secondary | ICD-10-CM

## 2021-09-17 DIAGNOSIS — Z8616 Personal history of COVID-19: Secondary | ICD-10-CM

## 2021-09-17 DIAGNOSIS — K9401 Colostomy hemorrhage: Principal | ICD-10-CM | POA: Diagnosis present

## 2021-09-17 DIAGNOSIS — Z91013 Allergy to seafood: Secondary | ICD-10-CM

## 2021-09-17 DIAGNOSIS — I252 Old myocardial infarction: Secondary | ICD-10-CM | POA: Diagnosis not present

## 2021-09-17 DIAGNOSIS — Z794 Long term (current) use of insulin: Secondary | ICD-10-CM

## 2021-09-17 DIAGNOSIS — Z87891 Personal history of nicotine dependence: Secondary | ICD-10-CM

## 2021-09-17 DIAGNOSIS — Z7982 Long term (current) use of aspirin: Secondary | ICD-10-CM

## 2021-09-17 DIAGNOSIS — N2581 Secondary hyperparathyroidism of renal origin: Secondary | ICD-10-CM | POA: Diagnosis present

## 2021-09-17 DIAGNOSIS — I953 Hypotension of hemodialysis: Secondary | ICD-10-CM | POA: Diagnosis present

## 2021-09-17 DIAGNOSIS — Z7951 Long term (current) use of inhaled steroids: Secondary | ICD-10-CM

## 2021-09-17 DIAGNOSIS — I5042 Chronic combined systolic (congestive) and diastolic (congestive) heart failure: Secondary | ICD-10-CM | POA: Diagnosis present

## 2021-09-17 DIAGNOSIS — B182 Chronic viral hepatitis C: Secondary | ICD-10-CM | POA: Diagnosis present

## 2021-09-17 DIAGNOSIS — Z87892 Personal history of anaphylaxis: Secondary | ICD-10-CM

## 2021-09-17 DIAGNOSIS — H814 Vertigo of central origin: Secondary | ICD-10-CM

## 2021-09-17 DIAGNOSIS — Z885 Allergy status to narcotic agent status: Secondary | ICD-10-CM

## 2021-09-17 DIAGNOSIS — F32A Depression, unspecified: Secondary | ICD-10-CM | POA: Diagnosis present

## 2021-09-17 DIAGNOSIS — Z531 Procedure and treatment not carried out because of patient's decision for reasons of belief and group pressure: Secondary | ICD-10-CM | POA: Diagnosis present

## 2021-09-17 DIAGNOSIS — J449 Chronic obstructive pulmonary disease, unspecified: Secondary | ICD-10-CM | POA: Diagnosis present

## 2021-09-17 DIAGNOSIS — E889 Metabolic disorder, unspecified: Secondary | ICD-10-CM | POA: Diagnosis present

## 2021-09-17 DIAGNOSIS — Z992 Dependence on renal dialysis: Secondary | ICD-10-CM

## 2021-09-17 HISTORY — DX: Gastrointestinal hemorrhage, unspecified: K92.2

## 2021-09-17 LAB — BASIC METABOLIC PANEL
Anion gap: 13 (ref 5–15)
BUN: 36 mg/dL — ABNORMAL HIGH (ref 8–23)
CO2: 28 mmol/L (ref 22–32)
Calcium: 8 mg/dL — ABNORMAL LOW (ref 8.9–10.3)
Chloride: 95 mmol/L — ABNORMAL LOW (ref 98–111)
Creatinine, Ser: 6.88 mg/dL — ABNORMAL HIGH (ref 0.44–1.00)
GFR, Estimated: 6 mL/min — ABNORMAL LOW (ref >60)
Glucose, Bld: 227 mg/dL — ABNORMAL HIGH (ref 70–99)
Potassium: 4.2 mmol/L (ref 3.5–5.1)
Sodium: 136 mmol/L (ref 135–145)

## 2021-09-17 LAB — RESP PANEL BY RT-PCR (FLU A&B, COVID) ARPGX2
Influenza A by PCR: NEGATIVE
Influenza B by PCR: NEGATIVE
SARS Coronavirus 2 by RT PCR: NEGATIVE

## 2021-09-17 LAB — CBC WITH DIFFERENTIAL/PLATELET
Abs Immature Granulocytes: 0.14 10*3/uL — ABNORMAL HIGH (ref 0.00–0.07)
Basophils Absolute: 0 10*3/uL (ref 0.0–0.1)
Basophils Relative: 0 %
Eosinophils Absolute: 0.2 10*3/uL (ref 0.0–0.5)
Eosinophils Relative: 3 %
HCT: 27.5 % — ABNORMAL LOW (ref 36.0–46.0)
Hemoglobin: 8.5 g/dL — ABNORMAL LOW (ref 12.0–15.0)
Immature Granulocytes: 2 %
Lymphocytes Relative: 14 %
Lymphs Abs: 1 10*3/uL (ref 0.7–4.0)
MCH: 29.3 pg (ref 26.0–34.0)
MCHC: 30.9 g/dL (ref 30.0–36.0)
MCV: 94.8 fL (ref 80.0–100.0)
Monocytes Absolute: 0.6 10*3/uL (ref 0.1–1.0)
Monocytes Relative: 9 %
Neutro Abs: 5 10*3/uL (ref 1.7–7.7)
Neutrophils Relative %: 72 %
Platelets: 193 10*3/uL (ref 150–400)
RBC: 2.9 MIL/uL — ABNORMAL LOW (ref 3.87–5.11)
RDW: 15.7 % — ABNORMAL HIGH (ref 11.5–15.5)
WBC: 7.1 10*3/uL (ref 4.0–10.5)
nRBC: 0 % (ref 0.0–0.2)

## 2021-09-17 LAB — TYPE AND SCREEN
ABO/RH(D): A POS
Antibody Screen: NEGATIVE

## 2021-09-17 LAB — TROPONIN I (HIGH SENSITIVITY)
Troponin I (High Sensitivity): 16 ng/L (ref <18)
Troponin I (High Sensitivity): 15 ng/L (ref <18)

## 2021-09-17 MED ORDER — DICLOFENAC SODIUM 1 % EX GEL
2.0000 g | Freq: Four times a day (QID) | CUTANEOUS | Status: DC | PRN
  Administered 2021-09-18: 2 g via TOPICAL
  Filled 2021-09-17 (×2): qty 100

## 2021-09-17 MED ORDER — LORATADINE 10 MG PO TABS
10.0000 mg | ORAL_TABLET | Freq: Every day | ORAL | Status: DC | PRN
  Administered 2021-09-18: 10 mg via ORAL
  Filled 2021-09-17: qty 1

## 2021-09-17 MED ORDER — PANTOPRAZOLE 80MG IVPB - SIMPLE MED
80.0000 mg | Freq: Once | INTRAVENOUS | Status: AC
  Administered 2021-09-17: 80 mg via INTRAVENOUS
  Filled 2021-09-17: qty 80

## 2021-09-17 MED ORDER — HYDROXYZINE HCL 25 MG PO TABS
25.0000 mg | ORAL_TABLET | Freq: Three times a day (TID) | ORAL | Status: DC | PRN
  Administered 2021-09-18: 25 mg via ORAL
  Filled 2021-09-17: qty 1

## 2021-09-17 MED ORDER — INSULIN ASPART 100 UNIT/ML IJ SOLN
0.0000 [IU] | Freq: Three times a day (TID) | INTRAMUSCULAR | Status: DC
  Administered 2021-09-18 (×2): 2 [IU] via SUBCUTANEOUS
  Administered 2021-09-19: 7 [IU] via SUBCUTANEOUS
  Administered 2021-09-19: 3 [IU] via SUBCUTANEOUS

## 2021-09-17 MED ORDER — HEPARIN SODIUM (PORCINE) 5000 UNIT/ML IJ SOLN
5000.0000 [IU] | Freq: Three times a day (TID) | INTRAMUSCULAR | Status: DC
  Filled 2021-09-17: qty 1

## 2021-09-17 MED ORDER — PANTOPRAZOLE INFUSION (NEW) - SIMPLE MED
8.0000 mg/h | INTRAVENOUS | Status: DC
  Administered 2021-09-17 – 2021-09-19 (×3): 8 mg/h via INTRAVENOUS
  Filled 2021-09-17: qty 100
  Filled 2021-09-17 (×2): qty 80
  Filled 2021-09-17 (×4): qty 100

## 2021-09-17 MED ORDER — ALBUTEROL SULFATE (2.5 MG/3ML) 0.083% IN NEBU: 3.0000 mL | INHALATION_SOLUTION | RESPIRATORY_TRACT | Status: DC | PRN

## 2021-09-17 MED ORDER — FLUTICASONE PROPIONATE 50 MCG/ACT NA SUSP
2.0000 | Freq: Every day | NASAL | Status: DC | PRN
  Filled 2021-09-17: qty 16

## 2021-09-17 MED ORDER — ATORVASTATIN CALCIUM 80 MG PO TABS
80.0000 mg | ORAL_TABLET | Freq: Every evening | ORAL | Status: DC
  Administered 2021-09-18 (×2): 80 mg via ORAL
  Filled 2021-09-17 (×2): qty 1

## 2021-09-17 MED ORDER — MONTELUKAST SODIUM 10 MG PO TABS
10.0000 mg | ORAL_TABLET | Freq: Every day | ORAL | Status: DC
  Administered 2021-09-18: 10 mg via ORAL
  Filled 2021-09-17 (×3): qty 1

## 2021-09-17 MED ORDER — RENA-VITE PO TABS
1.0000 | ORAL_TABLET | Freq: Every day | ORAL | Status: DC
  Administered 2021-09-18: 1 via ORAL
  Filled 2021-09-17 (×3): qty 1

## 2021-09-17 MED ORDER — FLUTICASONE FUROATE-VILANTEROL 200-25 MCG/ACT IN AEPB
1.0000 | INHALATION_SPRAY | Freq: Every day | RESPIRATORY_TRACT | Status: DC
  Administered 2021-09-18: 09:00:00 1 via RESPIRATORY_TRACT
  Filled 2021-09-17: qty 28

## 2021-09-17 MED ORDER — ALLOPURINOL 100 MG PO TABS
100.0000 mg | ORAL_TABLET | Freq: Every day | ORAL | Status: DC
  Administered 2021-09-18 – 2021-09-19 (×2): 100 mg via ORAL
  Filled 2021-09-17 (×2): qty 1

## 2021-09-17 MED ORDER — OXYCODONE HCL 5 MG PO TABS
5.0000 mg | ORAL_TABLET | Freq: Two times a day (BID) | ORAL | Status: DC | PRN
  Administered 2021-09-18 – 2021-09-19 (×3): 5 mg via ORAL
  Filled 2021-09-17 (×3): qty 1

## 2021-09-17 NOTE — H&P (Addendum)
[name] Ad[facility] History and Physical Service Pager: [phone]  Patient name: [name] Medical record number: [id number] Date of birth: 12/19/1955 Age: 66 y.o. Gender: female  Primary Care Provider: [place] Consultants: Nephro, GI Code Status: Full, including blood transfusions which was confirmed with family if patient unable to confirm   Preferred Emergency Contact:  Contact Information     Name Relation Home Work Mobile   [name] Daughter [phone]  [phone]   [name] [phone]  [phone]        Chief Complaint: dyspnea and stoma bleeding  Assessment and Plan: [name] is a 66 y.o. female presenting with dyspnea with hypotension and bleeding from stoma . PMH is significant for ESRD on HD, T2DM, HTN, CAD s/p stent x2, hx of MI, PAD, HLD, GERD, chronic combined systolic and diastolic CHF, gout, asthma, hep C.   Anemia   GI bleed   Colostomy  Presenting with minimal bleeding in colostomy bag (placed for prior necrotizing fasciitis) and irritation around site without signs of infection. Hbg 8.5. Baseline ~11.1 noted on 04/23/21. GI consulted in ED. Colonoscopy on 02/03/2018 noted 4m polyp in the cecum and 2 polyps in the transverse colon and ascending colon. Advised to have repeat colonoscopy in 5-10 years. EGD on 09/20/2020 noted two non-bleeding cratered duodenal ulcers with a clean ulcer base (For[place] Class III) were found in the duodenal bulb. The largest lesion was 15 mm in largest dimension.  I suspect that anemia is heavily impacted by recent HD sessions although may have component of bleeding given evidence in colostomy bag and may benefit from consultation.  Reassuring that patient is hemodynamically stable at this time. [name] tele[facility] unit -GI consulted, appreciate recommendations, follow up in morning as page not returned to ED provider -NPO w/ sips  for meds -PT/OT eval and treat -Consult to wound/ostomy, appreciate assistance -A.m. CBC  Shortness of Breath   Hypotension   Hx of HTN Troponin negative x2.  Hemodynamically stable on room air.  CXR notable for central vascular congestion and streaky basilar scarring and atelectasis. No pulmonary edema or pleural effusions. Blood pressures stable during entire time. In the ED with lowest being 107/63.  Likely due to hemodialysis after taking blood pressure and pain medications.  Not requiring further medical intervention at this time. Home medications include Hydralazine 1056mTID, Coreg 2538mID and Percocet 7.5-325 mg 3 times daily. -Monitor BP -Continuous cardiac monitoring x12 hours -Vital signs per unit, with pulse ox check -Hold home blood pressure medications -Oxy 5 mg twice daily as needed  Chronic combined systolic and diastolic CHF Last echo on 07/29/2018 showed LVEF 60-65%, mod LVH, G1DD, mild-mod AS, mild dilation of LA. Home medications include torsemide 42m38mD.  Given patient has regular HD, unsure of direct need for diuretics.  She does present with lower extremity edema and in the setting of recent shortness of breath and out of date echo, may benefit from another echo. -Echo -Daily weights -Strict I's and O's  ESRD on HD T/Th/Sa Only able to complete 1hr of dialysis today secondary to hypotension during HD.  Placed in trendelenburg and  developed worsening SOB. Reportedly 10kg over her baseline weight. Left AV fistula. Nephrology consulted in ED. -Nephrology consulted, appreciate assistance, follow-up in the morning as patient return to ED provider -AM BMP  T2DM: stable Elevated fructosamine 522 on 08/11/2021 which per PCP is equivalent to A1c 10.5%. Home medications include 140  units of novolin daily and patient did receive her medication this morning.. Most recent glucose 227. -Hold home medication at this time given NPO status -Consult to diabetes coordinator,  appreciate assistance -CBG monitoring 4 times daily, before meals and at bedtime -Sensitive SSI  CAD s/p stent x2   PAD   MI (2017): Stable Home medications include ASA 51m daily.  -Hold at this time in the setting of GI bleed  HLD stable Home medications include atorvastatin 857mdaily.  Last lipid profile on 09/13/2016 noted LDL 52, HDL 36, triglycerides 168. -A.m. lipid panel  GERD Home medications include omeprazole 2075maily and protonix 79m69mD however patient only endorses omeprazole.  Placed on IV Protonix and will likely have change of medication by GI recommendations. -Hold home medications -Continue Protonix IV  Gout: Stable Home medications include allopurinol 100mg47mly.  -Continue home medications  Asthma   Allergies: Stable Home medications include Singulair 10mg 60m Breo Ellipta dailiy, and albuterol q4h prn in addition to flonase daily and claritin 10mg d36m.   FEN/GI: N.p.o. with sips for meds Prophylaxis: Heparin subcu every 8 hours  Disposition: Progressive  History of Present Illness:  Heather KERSHAW5 y.o.67emale presenting with hypotensive episode with dyspnea and recent colostomy bleeding.  Patient notes that she was at dialysis today for assistance with getting extra fluid removed as she has been retaining more fluid and noted to be 10 kg over her baseline.  She received dialysis yesterday as well.  Today she was only able to receive dialysis for about an hour before feeling short of breath in which EMS was called and she was brought to the ED.  Since being here, she feels that her breathing has improved back to her baseline and denies any oxygen requirement at home.  Denies chest pain during this event although states it did remind her of the heart attack she had in her past.  She recalls taking all of her medications prior to hemodialysis which consisted of notably carvedilol, Percocet, Lyrica.  She also notes that she has noticed blood and darker  stool in her colostomy for the last month.  The colostomy was placed for necrotizing fasciitis around January 2019.  It is was supposed to be reversed by general surgery at some time but she has not been able to get this done until her diabetes is under better control.  She also recalls that she is supposed to get knee replacements as she does not walk very well due to arthritis.  Upon arrival and in the ED, patient was given oxygen and had improved for respiratory concern.  She did not have any hypotensive episodes since arriving to the ED and eventually became stable on room air.  For her persistent bleeding from her stoma, the ED consulted GI.  She received lab work-up and was placed on IV Protonix.  Additionally nephrology was consulted for HD.  Patient used to be a cocaine addict and is 22 years sober.  Very minimal alcohol use (1-2 times a year).  Review Of Systems: Per HPI with the following additions:   Review of Systems  Constitutional:  Negative for chills and fever.  Respiratory:  Negative for cough, chest tightness and shortness of breath.   Cardiovascular:  Negative for chest pain.  Gastrointestinal:  Negative for abdominal pain and nausea.       Reflux  Genitourinary:  Negative for dysuria, frequency and urgency.  Psychiatric/Behavioral:  Negative for confusion.  Patient Active Problem List   Diagnosis Date Noted   Severe sepsis ([place]) 04/21/2021   COVID-19 virus infection 04/20/2021   Upper GI bleed 09/17/2020   Prolonged QT interval 09/17/2020   COPD (chronic obstructive pulmonary disease) ([place]) 09/17/2020   Acute pulmonary edema ([place]) 09/14/2020   Acute respiratory failure with hypoxia ([place]) 09/13/2020   Allergy, unspecified, initial encounter 03/29/2020   Anaphylactic shock, unspecified, initial encounter 03/29/2020   Volume overload 01/30/2020   Pain in left upper arm 01/18/2020   Cellulitis 01/16/2020   Anemia due to end stage renal disease ([place]) 01/16/2020    Diabetic polyneuropathy associated with type 2 diabetes mellitus ([place]) 11/06/2019   Nystagmus 11/06/2019   Intermittent claudication ([place]) 11/06/2019   Vertigo of central origin 11/06/2019   Weakness    Hypervolemia associated with renal insufficiency 10/31/2019   BMI 45.0-49.9, adult ([place]) 10/18/2019   Ascending aortic aneurysm 03/27/2019   Puncture wound without foreign body of left forearm, initial encounter 01/02/2019   Non-healing wound of upper extremity 12/28/2018   Unspecified open wound of left upper arm, initial encounter 05/12/5944   Complication from renal dialysis device 10/20/2018   Iron deficiency anemia, unspecified 10/18/2018   ESRD (end stage renal disease) on dialysis ([place]) 06/01/2018   ARF (acute renal failure) ([place]) 05/09/2018   Colon cancer screening    LGSIL on Pap smear of cervix 01/27/2018   Gout 12/25/2017   AKI (acute kidney injury) ([place]) 12/24/2017   Hyperglycemia 12/24/2017   Chronic diastolic heart failure ([place]) 09/30/2017   Lymphedema 85/92/9244   Acute metabolic encephalopathy 62/86/3817   Aortic stenosis    Acute pain of both knees 02/12/2017   Chronic gout due to renal impairment of multiple sites without tophus 02/12/2017   Esophageal dysphagia 02/12/2017   Osteoarthritis 01/22/2017   Diabetic hyperosmolar non-ketotic state ([place]) 12/13/2016   CAD (coronary artery disease) 12/13/2016   Hyperlipidemia 12/13/2016   Hyperphosphatemia 71/16/5790   Acute diastolic CHF (congestive heart failure) (HCC)    Hyperkalemia 08/21/2016   Asthma 11/29/2015   Depression 09/05/2015   GERD (gastroesophageal reflux disease) 03/25/2015   Environmental allergies 03/14/2015   SOB (shortness of breath) 02/15/2015   Chronic hepatitis C without hepatic coma ([place]) 01/31/2015   Diabetic neuropathy ([place]) 01/31/2015   OSA (obstructive sleep apnea) 01/01/2015   Poor dentition 11/13/2014   Essential hypertension 10/08/2014   Morbid (severe) obesity due to excess  calories ([place]) 10/08/2014   Shortness of breath    Localized, primary osteoarthritis 01/25/2014   Family history of diabetes mellitus 03/31/2013   Pain in limb 03/31/2013   Uncontrolled type 2 diabetes mellitus with hyperosmolar nonketotic hyperglycemia ([place]) 03/25/2012    Class: Chronic    Past Medical History: Past Medical History:  Diagnosis Date   Anemia    Aortic stenosis    Echo 8/18: mean 13, peak 28, LVOT/AV mean velocity 0.51   Arthritis    Asthma    As a child    Bronchitis    CAD (coronary artery disease)    a. 09/2016: 50% Ost 1st Mrg stenosis, 50% 2nd Mrg stenosis, 20% Mid-Cx, 95% Prox LAD, 40% mid-LAD, and 10% dist-LAD stenosis. Staged PCI with DES to Prox-LAD.    Chronic combined systolic and diastolic CHF (congestive heart failure) ([place]) 2011   echo 2/18: EF 55-60, normal wall motion, grade 2 diastolic dysfunction, trivial AI // echo 3/18: Septal and apical HK, EF 45-50, normal wall motion, trivial AI, mild LAE, PASP 38 // echo 8/18: EF  60-65, normal wall motion, grade 1 diastolic dysfunction, calcified aortic valve leaflets, mild aortic stenosis (mean 13, peak 28, LVOT/AV mean velocity 0.51), mild AI, moderate MAC, mild LAE, trivial TR    Chronic kidney disease    STAGE 4   Chronic kidney disease on chronic dialysis (HCC)    t, th, sat   Complication of anesthesia    Depression    Diabetes mellitus Dx 1989   Elevated lipids    GERD (gastroesophageal reflux disease)    Gout    Heart murmur    asymptomatic   Hepatitis C Dx 2013   Hypertension Dx 1989   Infected surgical wound    Lt arm   Myocardial infarction ([place]) 07/2015   Obesity    Pancreatitis 2013   Pneumonia    Refusal of blood transfusions as patient is Jehovah's Witness    pt states she is not [place] witness and does not refuse blood products   Tendinitis    Tremors of nervous system    LEFT HAND   Ulcer 2010    Past Surgical History: Past Surgical History:  Procedure Laterality Date    A/V FISTULAGRAM Left 04/11/2019   Procedure: A/V FISTULAGRAM;  Surgeon: Katha Cabal, MD;  Location: [place] [facility];  Service: Cardiovascular;  Laterality: Left;   A/V FISTULAGRAM Left 06/02/2019   Procedure: A/V FISTULAGRAM;  Surgeon: Katha Cabal, MD;  Location: [place] [facility];  Service: Cardiovascular;  Laterality: Left;   APPLICATION OF WOUND VAC Left 08/14/2017   Procedure: APPLICATION OF WOUND VAC Exchange;  Surgeon: Robert Bellow, MD;  Location: [facility];  Service: General;  Laterality: Left;   APPLICATION OF WOUND VAC Left 12/21/2018   Procedure: APPLICATION OF WOUND VAC;  Surgeon: Katha Cabal, MD;  Location: [facility];  Service: Vascular;  Laterality: Left;   AV FISTULA PLACEMENT Left 08/19/2018   Procedure: ARTERIOVENOUS (AV) FISTULA CREATION ( BRACHIOBASILIC );  Surgeon: Katha Cabal, MD;  Location: [facility];  Service: Vascular;  Laterality: Left;   BASCILIC VEIN TRANSPOSITION Left 11/18/2018   Procedure: BASCILIC VEIN TRANSPOSITION;  Surgeon: Katha Cabal, MD;  Location: [facility];  Service: Vascular;  Laterality: Left;   BIOPSY  09/20/2020   Procedure: BIOPSY;  Surgeon: Carol Ada, MD;  Location: [place];  Service: Endoscopy;;   CHOLECYSTECTOMY     COLONOSCOPY WITH PROPOFOL N/A 02/03/2018   Procedure: COLONOSCOPY WITH PROPOFOL;  Surgeon: Lin Landsman, MD;  Location: [facility];  Service: Gastroenterology;  Laterality: N/A;   CORONARY ANGIOPLASTY  07/2015   STENT   CORONARY STENT INTERVENTION N/A 09/18/2016   Procedure: Coronary Stent Intervention;  Surgeon: Troy Sine, MD;  Location: [place] [facility];  Service: Cardiovascular;  Laterality: N/A;   DIALYSIS/PERMA CATHETER INSERTION N/A 05/10/2018   Procedure: DIALYSIS/PERMA CATHETER INSERTION;  Surgeon: Katha Cabal, MD;  Location: [place] [facility];  Service: Cardiovascular;  Laterality: N/A;   DRESSING CHANGE UNDER ANESTHESIA Left 08/15/2017   Procedure:  exploration of wound for bleeding;  Surgeon: Robert Bellow, MD;  Location: [facility];  Service: General;  Laterality: Left;   ESOPHAGOGASTRODUODENOSCOPY (EGD) WITH PROPOFOL N/A 02/03/2018   Procedure: ESOPHAGOGASTRODUODENOSCOPY (EGD) WITH PROPOFOL;  Surgeon: Lin Landsman, MD;  Location: [facility]OS[facility];  Service: Gastroenterology;  Laterality: N/A;   ESOPHAGOGASTRODUODENOSCOPY (EGD) WITH PROPOFOL N/A 09/20/2020   Procedure: ESOPHAGOGASTRODUODENOSCOPY (EGD) WITH PROPOFOL;  Surgeon: Carol Ada, MD;  Location: [place];  Service: Endoscopy;  Laterality: N/A;  EYE SURGERY  11/17/2018   INCISION AND DRAINAGE ABSCESS Left 08/12/2017   Procedure: INCISION AND DRAINAGE ABSCESS;  Surgeon: Robert Bellow, MD;  Location: [facility];  Service: General;  Laterality: Left;   KNEE ARTHROSCOPY     LEFT HEART CATH N/A 09/18/2016   Procedure: Left Heart Cath;  Surgeon: Troy Sine, MD;  Location: [place] [facility];  Service: Cardiovascular;  Laterality: N/A;   LEFT HEART CATH AND CORONARY ANGIOGRAPHY N/A 09/16/2016   Procedure: Left Heart Cath and Coronary Angiography;  Surgeon: Burnell Blanks, MD;  Location: [place] [facility];  Service: Cardiovascular;  Laterality: N/A;   LEFT HEART CATH AND CORONARY ANGIOGRAPHY N/A 04/29/2017   Procedure: LEFT HEART CATH AND CORONARY ANGIOGRAPHY;  Surgeon: Nelva Bush, MD;  Location: [place] [facility];  Service: Cardiovascular;  Laterality: N/A;   LOWER EXTREMITY ANGIOGRAPHY Right 03/08/2018   Procedure: LOWER EXTREMITY ANGIOGRAPHY;  Surgeon: Katha Cabal, MD;  Location: [place] [facility];  Service: Cardiovascular;  Laterality: Right;   TUBAL LIGATION     TUBAL LIGATION     UPPER EXTREMITY ANGIOGRAPHY Right 09/19/2019   Procedure: UPPER EXTREMITY ANGIOGRAPHY;  Surgeon: Katha Cabal, MD;  Location: [place] [facility];  Service: Cardiovascular;  Laterality: Right;   WOUND DEBRIDEMENT Left 12/21/2018   Procedure: DEBRIDEMENT WOUND;   Surgeon: Katha Cabal, MD;  Location: [facility];  Service: Vascular;  Laterality: Left;   WOUND DEBRIDEMENT Left 12/30/2018   Procedure: DEBRIDEMENT WOUND WITH VAC PLACEMENT (LEFT UPPER EXTREMITY);  Surgeon: Katha Cabal, MD;  Location: [facility];  Service: Vascular;  Laterality: Left;    Social History: Social History   Tobacco Use   Smoking status: Former    Packs/day: 0.25    Years: 6.00    Pack years: 1.50    Types: Cigarettes    Quit date: 10/25/1980    Years since quitting: 40.9   Smokeless tobacco: Never  Vaping Use   Vaping Use: Never used  Substance Use Topics   Alcohol use: Yes    Comment: rare   Drug use: Not Currently    Types: Marijuana   Additional social history: 72 years sober from cocaine  Family History: Family History  Problem Relation Age of Onset   Colon cancer Mother    Heart attack Other    Heart attack Maternal Grandmother    Hypertension Sister    Hypertension Brother    Diabetes Paternal Grandmother    Breast cancer Neg Hx     Allergies and Medications: Allergies  Allergen Reactions   Morphine Itching    Tolerated hydromorphone on 07/2020   Shellfish Allergy Anaphylaxis and Swelling   Diazepam Other (See Comments)    "felt like out of body experience"   No current facility-administered medications on file prior to encounter.   Current Outpatient Medications on File Prior to Encounter  Medication Sig Dispense Refill   albuterol (VENTOLIN HFA) 108 (90 Base) MCG/ACT inhaler Inhale 2 puffs into the lungs every 4 (four) hours as needed for wheezing or shortness of breath. 18 g 5   allopurinol (ZYLOPRIM) 100 MG tablet TAKE 1 TABLET BY MOUTH ONCE DAILY. 30 tablet 5   aspirin 81 MG EC tablet Take 1 tablet (81 mg total) by mouth daily. 30 tablet 12   atorvastatin (LIPITOR) 80 MG tablet Take 1 tablet (80 mg total) by mouth every evening. 90 tablet 0   AURYXIA 1 GM 210 MG(Fe) tablet Take 2 tablets (420  mg total) by mouth 3 (three) times  daily with meals. 540 tablet 3   B Complex-C-Folic Acid (RENA-VITE RX PO) Take 1 mg by mouth daily.     B Complex-C-Folic Acid (RENA-VITE RX) 1 MG TABS Take 1 mg by mouth daily.     buPROPion (WELLBUTRIN SR) 150 MG 12 hr tablet Take 1 tablet (150 mg total) by mouth 2 (two) times daily. 180 tablet 3   carvedilol (COREG) 25 MG tablet Take 1 tablet (25 mg total) by mouth 2 (two) times daily with a meal. 180 tablet 1   colchicine 0.6 MG tablet Take 1 tablet (0.6 mg total) by mouth once a week for 15 days. 2 tablet 0   Continuous Blood Gluc Receiver (FREESTYLE LIBRE 14 DAY READER) DEVI To check blood sugar ACHS 1 each 0   Continuous Blood Gluc Sensor (FREESTYLE LIBRE 14 DAY SENSOR) MISC To check blood sugar ACHS; change every 14 days 6 each 11   cyclobenzaprine (FLEXERIL) 10 MG tablet Take 1 tablet (10 mg total) by mouth 2 (two) times daily as needed for muscle spasms. 20 tablet 0   diclofenac Sodium (VOLTAREN) 1 % GEL Apply 2 g topically 4 (four) times daily. 100 g 0   dicyclomine (BENTYL) 20 MG tablet Take 1 tablet (20 mg total) by mouth in the morning and at bedtime. 180 tablet 1   docusate sodium (COLACE) 100 MG capsule Take 1 capsule (100 mg total) by mouth 2 (two) times daily as needed for mild constipation. 30 capsule 0   doxepin (SINEQUAN) 10 MG capsule TAKE 1 CAPSULE(10 MG) BY MOUTH FOUR TIMES DAILY AS NEEDED FOR ITCHING (Patient taking differently: Take 10 mg by mouth 2 (two) times daily.) 360 capsule 3   fluticasone (FLONASE) 50 MCG/ACT nasal spray Place 2 sprays into both nostrils daily as needed for allergies or rhinitis. 48 g 1   fluticasone furoate-vilanterol (BREO ELLIPTA) 200-25 MCG/INH AEPB Inhale 1 puff into the lungs daily. 60 each 0   hydrALAZINE (APRESOLINE) 100 MG tablet Take 100 mg by mouth 3 (three) times daily.     hydrOXYzine (ATARAX/VISTARIL) 25 MG tablet Take 1 tablet (25 mg total) by mouth 4 (four) times daily. 360 tablet 1   Insulin NPH, Human,, Isophane, (NOVOLIN N  FLEXPEN) 100 UNIT/ML Kiwkpen Inject 140 Units into the skin every morning. And 32G pen needles 1/day 150 mL 3   lidocaine-prilocaine (EMLA) cream Apply 1 application topically as needed. (Patient taking differently: Apply 1 application topically as needed (dialysis days).) 30 g 11   loratadine (CLARITIN) 10 MG tablet TAKE 1 TABLET BY MOUTH ONCE DAILY. (Patient taking differently: Take 10 mg by mouth daily as needed for allergies.) 90 tablet 3   montelukast (SINGULAIR) 10 MG tablet TAKE ONE TABLET BY MOUTH AT BEDTIME. (Patient taking differently: Take 10 mg by mouth at bedtime as needed (allergies).) 90 tablet 3   nitroGLYCERIN (NITROSTAT) 0.4 MG SL tablet Place 1 tablet (0.4 mg total) under the tongue every 5 (five) minutes x 3 doses as needed for chest pain. 25 tablet 12   Nutritional Supplements (FEEDING SUPPLEMENT, NEPRO CARB STEADY,) LIQD Take 237 mLs by mouth daily. 7110 mL 0   nystatin cream (MYCOSTATIN) Apply 1 application topically 2 (two) times daily. (Patient taking differently: Apply 1 application topically daily as needed for dry skin.) 30 g 0   omeprazole (PRILOSEC) 20 MG capsule Take 20 mg by mouth daily.     oxyCODONE (ROXICODONE) 5 MG immediate release tablet Take  1 tablet (5 mg total) by mouth every 12 (twelve) hours as needed for severe pain. 60 tablet 0   oxyCODONE-acetaminophen (PERCOCET/ROXICET) 5-325 MG tablet Take 1 tablet by mouth every 8 (eight) hours as needed. 12 tablet 0   pantoprazole (PROTONIX) 40 MG tablet Take 1 tablet (40 mg total) by mouth 2 (two) times daily. 60 tablet 2   pregabalin (LYRICA) 150 MG capsule Take 1 capsule (150 mg total) by mouth 2 (two) times daily. 180 capsule 1   SENNA-PLUS 8.6-50 MG tablet TAKE ONE TABLET BY MOUTH AT BEDTIME. 90 tablet 3   sucralfate (CARAFATE) 1 g tablet Take 1 tablet (1 g total) by mouth 2 (two) times daily for 26 days. 52 tablet 0   torsemide (DEMADEX) 20 MG tablet TAKE (2) TABLETS BY MOUTH TWICE DAILY. 360 tablet 1    triamcinolone cream (KENALOG) 0.1 % APPLY TO AFFECTED AREA TWICE DAILY (Patient taking differently: Apply 1 application topically 2 (two) times daily as needed (rash).) 80 g 0   ULTICARE SHORT PEN NEEDLES 31G X 8 MM MISC USE THREE TIMES DAILY WITH INSULIN 100 each 0   valACYclovir (VALTREX) 500 MG tablet TAKE (1) TABLET BY MOUTH EVERY OTHER DAY. 45 tablet 1   Vitamin D, Ergocalciferol, (DRISDOL) 1.25 MG (50000 UNIT) CAPS capsule TAKE 1 CAPSULE BY MOUTH ONCE A MONTH 1 capsule 11   [DISCONTINUED] gabapentin (NEURONTIN) 100 MG capsule Take 1 capsule (100 mg total) by mouth 3 (three) times daily. 90 capsule 1    Objective: BP 107/63    Pulse 65    Temp (!) 97.5 F (36.4 C) (Oral)    Resp (!) 9    Ht _0  (1.727 m)    Wt 126.6 kg    SpO2 100%    BMI 42.42 kg/m  Exam: General: Awake and alert, NAD, eating dinner Eyes: EOMI Neck: Normal ROM Cardiovascular: RRR, 3/6 systolic murmur Respiratory: CTAB, no increased work of breathing on room air Gastrointestinal: Soft, obese abdomen, nontender, normoactive bowel sounds.  Colostomy bag in place without signs of infection or erythema but with irritation towards inferior portion of bag, minimal blood noted in colostomy bag Extremities: 2+ pitting lower extremity edema, 2+ pedal pulses, patent left AV fistula Derm: No signs of rashes or infection noted Neuro: No focal neurological deficits, appropriate speech, moving bilateral lower extremities appropriately Psych: Normal mood and behavior  Labs and Imaging: CBC BMET  Recent Labs  Lab 09/17/21 1453  WBC 7.1  HGB 8.5*  HCT 27.5*  PLT 193   Recent Labs  Lab 09/17/21 1453  NA 136  K 4.2  CL 95*  CO2 28  BUN 36*  CREATININE 6.88*  GLUCOSE 227*  CALCIUM 8.0*     Troponins: 16, 15  EKG: NSR _1 , normal PR and QRS intervals, without ST elevation, prolonged QT 505   Wells Guiles, DO 09/17/2021, 7:53 PM PGY-1, [place] Intern pager: [phone], text pages  welcome   FPTS Upper-Level Resident Addendum   I have independently interviewed and examined the patient. I have discussed the above with the original author and agree with their documentation. Please see also any attending notes.   Carollee Leitz, MD PGY-3, [facility] 09/18/2021 1:39 AM  FPTS Service pager: [phone] (text pages welcome through [facility])

## 2021-09-17 NOTE — H&P (Shared)
Family Medicine Teaching Service Daily Progress Note Intern Pager: [phone]  Patient name: [name] Medical record number: [id number] Date of birth: [birth year] Age: 66 y.o. Gender: female  Primary Care Provider: [facility] [place] Consultants: *** Code Status: *** No Blood transfusion:    Pt Overview and Major Events to Date:  ***  Assessment and Plan:   Shortness of Breath   ESRD HD T/T/S Unable to complete dialysis today secondary to hypotension during HD.  Placed in trendelenburg and  developed worsening SOB. Left AV fistula Nephrology consulted in ED   Colostomy Reports dark stool from stoma. Hbg dropped to 8.5, maybe from recent recent HD today.   Baseline ~11.  GI consulted in ED  DM Type 2  Hep C HTN CAD s/p stent   FEN/GI: *** PPx: *** Dispo:{FPTSDISPOLIST:25765} {FPTSDISPOTIME:25766}. Barriers include ***.   Subjective:  ***  Objective: Temp:  [97.5 F (36.4 C)] 97.5 F (36.4 C) (03/08 1432) Pulse Rate:  [66-70] 66 (03/08 1800) Resp:  [11-24] 11 (03/08 1800) BP: (111-130)/(55-95) 127/65 (03/08 1800) SpO2:  [99 %-100 %] 99 % (03/08 1800) Weight:  [126.6 kg] 126.6 kg (03/08 1430) Physical Exam: General: *** Cardiovascular: *** Respiratory: *** Abdomen: *** Extremities: ***  Laboratory: Recent Labs  Lab 09/17/21 1453  WBC 7.1  HGB 8.5*  HCT 27.5*  PLT 193   Recent Labs  Lab 09/17/21 1453  NA 136  K 4.2  CL 95*  CO2 28  BUN 36*  CREATININE 6.88*  CALCIUM 8.0*  GLUCOSE 227*    ***  Imaging/Diagnostic Tests: Carollee Leitz, MD 09/17/2021, 6:56 PM PGY-***, [place] Intern pager: [phone], text pages welcome

## 2021-09-17 NOTE — ED Triage Notes (Signed)
Pt BIBA from was at dialysis. Only had 1 kg taken off, is 10 kilos over baseline weight. ? ?T/Th/Sa dialysis pt ? ?Pt c/o SHOB- acute onset. Pt c/o chest pressure.  ? ?95% on RA- but SHOB. Placed on simple mask which helped with WOB.  ? ?Pt received 324 ASA ?

## 2021-09-17 NOTE — ED Provider Notes (Signed)
[facility] EMERGENCY DEPARTMENT Provider Note   CSN: [id number] Arrival date & time: 09/17/21  1424     History  Chief Complaint  Patient presents with   Shortness of Breath    [name] is a 66 y.o. female.   Shortness of Breath    54F with a hx of ESRD on HD TuThSat but was at dialysis today for an extra session due to concern for extra fluid when she became hypotensive at dialysis. Staff then put her legs up and laid her flat and she developed acute respiratory distress and so EMS was called. She states that since then she has been feeling slightly better, but continues to endorse mild dyspnea at rest. Additionally, she states that she has had dark stools coming from her stoma and endorses persistent bleeding from the stoma. This has been ongoing for about a month. She states that her dialysis session was terminated early today and she only had 1kg taken off. She is around 10kg over her baseline weight.   Home Medications Prior to Admission medications   Medication Sig Start Date End Date Taking? Authorizing Provider  albuterol (VENTOLIN HFA) 108 (90 Base) MCG/ACT inhaler Inhale 2 puffs into the lungs every 4 (four) hours as needed for wheezing or shortness of breath. 06/28/20   [name], PA-C  allopurinol (ZYLOPRIM) 100 MG tablet TAKE 1 TABLET BY MOUTH ONCE DAILY. 07/16/21   Birdie Sons, MD  aspirin 81 MG EC tablet Take 1 tablet (81 mg total) by mouth daily. 09/20/20   Thurnell Lose, MD  atorvastatin (LIPITOR) 80 MG tablet Take 1 tablet (80 mg total) by mouth every evening. 04/24/21   Bacigalupo, Dionne Bucy, MD  AURYXIA 1 GM 210 MG(Fe) tablet Take 2 tablets (420 mg total) by mouth 3 (three) times daily with meals. 05/03/20   [name], PA-C  B Complex-C-Folic Acid (RENA-VITE RX PO) Take 1 mg by mouth daily.    [provider]  B Complex-C-Folic Acid (RENA-VITE RX) 1 MG TABS Take 1 mg by mouth daily. 10/15/20   [provider]  buPROPion (WELLBUTRIN SR) 150 MG 12 hr tablet Take 1 tablet (150 mg total) by mouth 2 (two) times daily. 05/03/20   [name], PA-C  carvedilol (COREG) 25 MG tablet Take 1 tablet (25 mg total) by mouth 2 (two) times daily with a meal. 10/29/20   Bacigalupo, Dionne Bucy, MD  colchicine 0.6 MG tablet Take 1 tablet (0.6 mg total) by mouth once a week for 15 days. 09/29/20 10/14/20  Thurnell Lose, MD  Continuous Blood Gluc Receiver (FREESTYLE LIBRE 14 DAY READER) DEVI To check blood sugar ACHS 09/27/20   [name], PA-C  Continuous Blood Gluc Sensor (FREESTYLE LIBRE 14 DAY SENSOR) MISC To check blood sugar ACHS; change every 14 days 09/27/20   [name], PA-C  cyclobenzaprine (FLEXERIL) 10 MG tablet Take 1 tablet (10 mg total) by mouth 2 (two) times daily as needed for muscle spasms. 10/24/20   Corena Herter, PA-C  diclofenac Sodium (VOLTAREN) 1 % GEL Apply 2 g topically 4 (four) times daily. 10/24/20   Corena Herter, PA-C  dicyclomine (BENTYL) 20 MG tablet Take 1 tablet (20 mg total) by mouth in the morning and at bedtime. 09/27/20   [name], PA-C  docusate sodium (COLACE) 100 MG capsule Take 1 capsule (100 mg total) by mouth 2 (two) times daily as needed for mild constipation.  09/20/17   Gouru, Illene Silver, MD  doxepin (SINEQUAN) 10 MG capsule TAKE 1 CAPSULE(10 MG) BY MOUTH FOUR TIMES DAILY AS NEEDED FOR ITCHING Patient taking differently: Take 10 mg by mouth 2 (two) times daily. 05/03/20   [name], PA-C  fluticasone (FLONASE) 50 MCG/ACT nasal spray Place 2 sprays into both nostrils daily as needed for allergies or rhinitis. 09/28/17   [name], PA-C  fluticasone furoate-vilanterol (BREO ELLIPTA) 200-25 MCG/INH AEPB Inhale 1 puff into the lungs daily. 08/30/20   [name], PA-C  hydrALAZINE (APRESOLINE) 100 MG tablet Take 100 mg by mouth 3 (three) times daily. 10/07/20   [provider]  hydrOXYzine  (ATARAX/VISTARIL) 25 MG tablet Take 1 tablet (25 mg total) by mouth 4 (four) times daily. 12/03/20   Bacigalupo, Dionne Bucy, MD  Insulin NPH, Human,, Isophane, (NOVOLIN N FLEXPEN) 100 UNIT/ML Kiwkpen Inject 140 Units into the skin every morning. And 32G pen needles 1/day 09/05/21   Renato Shin, MD  lidocaine-prilocaine (EMLA) cream Apply 1 application topically as needed. Patient taking differently: Apply 1 application topically as needed (dialysis days). 03/27/19   Schnier, Dolores Lory, MD  loratadine (CLARITIN) 10 MG tablet TAKE 1 TABLET BY MOUTH ONCE DAILY. Patient taking differently: Take 10 mg by mouth daily as needed for allergies. 07/15/20   [name], PA-C  montelukast (SINGULAIR) 10 MG tablet TAKE ONE TABLET BY MOUTH AT BEDTIME. Patient taking differently: Take 10 mg by mouth at bedtime as needed (allergies). 07/15/20   [name], PA-C  nitroGLYCERIN (NITROSTAT) 0.4 MG SL tablet Place 1 tablet (0.4 mg total) under the tongue every 5 (five) minutes x 3 doses as needed for chest pain. 05/03/20   [name], PA-C  Nutritional Supplements (FEEDING SUPPLEMENT, NEPRO CARB STEADY,) LIQD Take 237 mLs by mouth daily. 02/01/20   Loletha Grayer, MD  nystatin cream (MYCOSTATIN) Apply 1 application topically 2 (two) times daily. Patient taking differently: Apply 1 application topically daily as needed for dry skin. 08/10/19   [name], PA-C  omeprazole (PRILOSEC) 20 MG capsule Take 20 mg by mouth daily. 10/07/20   [provider]  oxyCODONE (ROXICODONE) 5 MG immediate release tablet Take 1 tablet (5 mg total) by mouth every 12 (twelve) hours as needed for severe pain. 08/30/20   [name], PA-C  oxyCODONE-acetaminophen (PERCOCET/ROXICET) 5-325 MG tablet Take 1 tablet by mouth every 8 (eight) hours as needed. 04/23/21   Thurnell Lose, MD  pantoprazole (PROTONIX) 40 MG tablet Take 1 tablet (40 mg total) by mouth 2 (two) times daily. 09/24/20   Thurnell Lose, MD  pregabalin (LYRICA) 150 MG capsule Take 1 capsule (150 mg total) by mouth 2 (two) times daily. 09/27/20   [name], PA-C  SENNA-PLUS 8.6-50 MG tablet TAKE ONE TABLET BY MOUTH AT BEDTIME. 07/15/20   [name], PA-C  sucralfate (CARAFATE) 1 g tablet Take 1 tablet (1 g total) by mouth 2 (two) times daily for 26 days. 09/24/20 10/20/20  Thurnell Lose, MD  torsemide (DEMADEX) 20 MG tablet TAKE (2) TABLETS BY MOUTH TWICE DAILY. 01/24/21   Virginia Crews, MD  triamcinolone cream (KENALOG) 0.1 % APPLY TO AFFECTED AREA TWICE DAILY Patient taking differently: Apply 1 application topically 2 (two) times daily as needed (rash). 06/17/18   [name], PA-C  ULTICARE SHORT PEN NEEDLES 31G X 8 MM MISC USE THREE TIMES DAILY WITH INSULIN 01/21/21   Jerrol Banana.,  MD  valACYclovir (VALTREX) 500 MG tablet TAKE (1) TABLET BY MOUTH EVERY OTHER DAY. 01/24/21   Bacigalupo, Dionne Bucy, MD  Vitamin D, Ergocalciferol, (DRISDOL) 1.25 MG (50000 UNIT) CAPS capsule TAKE 1 CAPSULE BY MOUTH ONCE A MONTH 11/04/20   Birdie Sons, MD  gabapentin (NEURONTIN) 100 MG capsule Take 1 capsule (100 mg total) by mouth 3 (three) times daily. 08/19/17 02/03/19  [name], MD      Allergies    Morphine, Shellfish allergy, and Diazepam    Review of Systems   Review of Systems  Respiratory:  Positive for shortness of breath.   All other systems reviewed and are negative.  Physical Exam Updated Vital Signs BP 107/63    Pulse 65    Temp (!) 97.5 F (36.4 C) (Oral)    Resp (!) 9    Ht '5\' 8"'$  (1.727 m)    Wt 126.6 kg    SpO2 100%    BMI 42.42 kg/m  Physical Exam Vitals and nursing note reviewed.  Constitutional:      General: She is not in acute distress.    Appearance: She is well-developed.  HENT:     Head: Normocephalic and atraumatic.  Eyes:     Conjunctiva/sclera: Conjunctivae normal.  Neck:     Vascular: JVD present.  Cardiovascular:     Rate and Rhythm: Normal rate  and regular rhythm.     Heart sounds: Murmur heard.  Pulmonary:     Effort: Pulmonary effort is normal. No respiratory distress.     Breath sounds: Decreased breath sounds present.  Abdominal:     Palpations: Abdomen is soft.     Tenderness: There is no abdominal tenderness.     Comments: Stoma in place with some bleeding into the ostomy bag noted, no clear melena in the bag at this time  Musculoskeletal:        General: No swelling.     Cervical back: Neck supple.     Right lower leg: Edema present.     Left lower leg: Edema present.  Skin:    General: Skin is warm and dry.     Capillary Refill: Capillary refill takes less than 2 seconds.  Neurological:     Mental Status: She is alert.  Psychiatric:        Mood and Affect: Mood normal.    ED Results / Procedures / Treatments   Labs (all labs ordered are listed, but only abnormal results are displayed) Labs Reviewed  CBC WITH DIFFERENTIAL/PLATELET - Abnormal; Notable for the following components:      Result Value   RBC 2.90 (*)    Hemoglobin 8.5 (*)    HCT 27.5 (*)    RDW 15.7 (*)    Abs Immature Granulocytes 0.14 (*)    All other components within normal limits  BASIC METABOLIC PANEL - Abnormal; Notable for the following components:   Chloride 95 (*)    Glucose, Bld 227 (*)    BUN 36 (*)    Creatinine, Ser 6.88 (*)    Calcium 8.0 (*)    GFR, Estimated 6 (*)    All other components within normal limits  RESP PANEL BY RT-PCR (FLU A&B, COVID) [id number]  TYPE AND SCREEN  TROPONIN I (HIGH SENSITIVITY)  TROPONIN I (HIGH SENSITIVITY)    EKG EKG Interpretation  Date/Time:  Wednesday September 17 2021 14:40:52 EST Ventricular Rate:  70 PR Interval:  137 QRS Duration: 108 QT Interval:  468 QTC Calculation:  505 R Axis:   59 Text Interpretation: Sinus rhythm Low voltage, precordial leads Prolonged QT interval Confirmed by Regan Lemming ([id number]) on 09/17/2021 2:58:42 PM  Radiology DG Chest Portable 1 View  Result Date:  09/17/2021 CLINICAL DATA:  Shortness of breath during dialysis. EXAM: PORTABLE CHEST 1 VIEW COMPARISON:  04/21/2021 FINDINGS: The heart is within normal limits in size given the AP projection portable technique. Stable coronary artery stent. Central vascular congestion and streaky basilar scarring and atelectasis but no overt pulmonary edema or pleural effusions. IMPRESSION: Central vascular congestion and streaky basilar scarring and atelectasis. No pulmonary edema or pleural effusions. Electronically Signed   By: Marijo Sanes M.D.   On: 09/17/2021 14:47    Procedures Procedures    Medications Ordered in ED Medications  pantoprozole (PROTONIX) 80 mg /NS 100 mL infusion (8 mg/hr Intravenous New Bag/Given 09/17/21 1712)  pantoprazole (PROTONIX) 80 mg /NS 100 mL IVPB (0 mg Intravenous Stopped 09/17/21 1732)    ED Course/ Medical Decision Making/ A&P Clinical Course as of 09/17/21 1940  Wed Sep 17, 2021  1629 Hemoglobin(!): 8.5 [JL]    Clinical Course User Index [JL] Regan Lemming, MD                           Medical Decision Making Amount and/or Complexity of Data Reviewed Labs: ordered. Decision-making details documented in ED Course.  Risk Decision regarding hospitalization.   41F with a hx of ESRD on HD TuThSat but was at dialysis today for an extra session due to concern for extra fluid when she became hypotensive at dialysis. Staff then put her legs up and laid her flat and she developed acute respiratory distress and so EMS was called. She states that since then she has been feeling slightly better, but continues to endorse mild dyspnea at rest. Additionally, she states that she has had dark stools coming from her stoma and endorses persistent bleeding from the stoma. This has been ongoing for about a month. She states that her dialysis session was terminated early today and she only had 1kg taken off. She is around 10kg over her baseline weight.   On exam, the patient was actively  bleeding from her stoma.  Her hemoglobin appears to is dropped to 8.5 from 11.1 4 months ago.   EKG was performed which revealed no ischemic changes, prolonged QT noted to 505, chest x-Cooks was performed which revealed central vascular congestion and strictly bibasilar scarring and atelectasis with no pulmonary edema or pleural effusions.  Given the concern for possible melena and hemoglobin drop of around three-point, the patient was started on IV Protonix due to concern for possible GI bleed.  She remained hemodynamically stable in the ED.  She remained on oxygen primarily for dyspnea but did not have hypoxia in the ED.  Both nephrology and gastroenterology were consulted with the plan for both specialties following inpatient.  Family practice medicine was consulted for admission for further management and accepted the patient in admission for further work-up.  I did not receive a call back from either gastroenterology or nephrology prior to admission.   Final Clinical Impression(s) / ED Diagnoses Final diagnoses:  Gastrointestinal hemorrhage, unspecified gastrointestinal hemorrhage type  Symptomatic anemia  SOB (shortness of breath)  Hypervolemia, unspecified hypervolemia type  ESRD (end stage renal disease) ([place])    Rx / DC Orders ED Discharge Orders     None         Maryl Blalock,  Jeneen Rinks, MD 09/17/21 1940

## 2021-09-18 ENCOUNTER — Other Ambulatory Visit (HOSPITAL_COMMUNITY): Payer: Medicare HMO

## 2021-09-18 ENCOUNTER — Telehealth: Payer: Self-pay

## 2021-09-18 DIAGNOSIS — R0602 Shortness of breath: Secondary | ICD-10-CM | POA: Diagnosis not present

## 2021-09-18 DIAGNOSIS — E8770 Fluid overload, unspecified: Secondary | ICD-10-CM | POA: Diagnosis not present

## 2021-09-18 DIAGNOSIS — K922 Gastrointestinal hemorrhage, unspecified: Secondary | ICD-10-CM | POA: Diagnosis not present

## 2021-09-18 DIAGNOSIS — N186 End stage renal disease: Secondary | ICD-10-CM | POA: Diagnosis not present

## 2021-09-18 LAB — RENAL FUNCTION PANEL
Albumin: 3 g/dL — ABNORMAL LOW (ref 3.5–5.0)
Anion gap: 15 (ref 5–15)
BUN: 50 mg/dL — ABNORMAL HIGH (ref 8–23)
CO2: 23 mmol/L (ref 22–32)
Calcium: 8.1 mg/dL — ABNORMAL LOW (ref 8.9–10.3)
Chloride: 99 mmol/L (ref 98–111)
Creatinine, Ser: 8.3 mg/dL — ABNORMAL HIGH (ref 0.44–1.00)
GFR, Estimated: 5 mL/min — ABNORMAL LOW (ref >60)
Glucose, Bld: 152 mg/dL — ABNORMAL HIGH (ref 70–99)
Phosphorus: 5.7 mg/dL — ABNORMAL HIGH (ref 2.5–4.6)
Potassium: 4.6 mmol/L (ref 3.5–5.1)
Sodium: 137 mmol/L (ref 135–145)

## 2021-09-18 LAB — CBC
HCT: 27.7 % — ABNORMAL LOW (ref 36.0–46.0)
HCT: 26.7 % — ABNORMAL LOW (ref 36.0–46.0)
Hemoglobin: 8.5 g/dL — ABNORMAL LOW (ref 12.0–15.0)
Hemoglobin: 8.3 g/dL — ABNORMAL LOW (ref 12.0–15.0)
MCH: 29.6 pg (ref 26.0–34.0)
MCH: 29.3 pg (ref 26.0–34.0)
MCHC: 30.7 g/dL (ref 30.0–36.0)
MCHC: 31.1 g/dL (ref 30.0–36.0)
MCV: 96.5 fL (ref 80.0–100.0)
MCV: 94.3 fL (ref 80.0–100.0)
Platelets: 198 10*3/uL (ref 150–400)
Platelets: 195 10*3/uL (ref 150–400)
RBC: 2.87 MIL/uL — ABNORMAL LOW (ref 3.87–5.11)
RBC: 2.83 MIL/uL — ABNORMAL LOW (ref 3.87–5.11)
RDW: 15.9 % — ABNORMAL HIGH (ref 11.5–15.5)
RDW: 15.8 % — ABNORMAL HIGH (ref 11.5–15.5)
WBC: 6 10*3/uL (ref 4.0–10.5)
WBC: 6.5 10*3/uL (ref 4.0–10.5)
nRBC: 0 % (ref 0.0–0.2)
nRBC: 0 % (ref 0.0–0.2)

## 2021-09-18 LAB — CBG MONITORING, ED
Glucose-Capillary: 172 mg/dL — ABNORMAL HIGH (ref 70–99)
Glucose-Capillary: 156 mg/dL — ABNORMAL HIGH (ref 70–99)

## 2021-09-18 LAB — HEPATITIS B SURFACE ANTIBODY,QUALITATIVE: Hep B S Ab: REACTIVE — AB

## 2021-09-18 LAB — BASIC METABOLIC PANEL
Anion gap: 14 (ref 5–15)
BUN: 41 mg/dL — ABNORMAL HIGH (ref 8–23)
CO2: 25 mmol/L (ref 22–32)
Calcium: 8 mg/dL — ABNORMAL LOW (ref 8.9–10.3)
Chloride: 101 mmol/L (ref 98–111)
Creatinine, Ser: 7.73 mg/dL — ABNORMAL HIGH (ref 0.44–1.00)
GFR, Estimated: 5 mL/min — ABNORMAL LOW (ref >60)
Glucose, Bld: 167 mg/dL — ABNORMAL HIGH (ref 70–99)
Potassium: 4.3 mmol/L (ref 3.5–5.1)
Sodium: 140 mmol/L (ref 135–145)

## 2021-09-18 LAB — LIPID PANEL
Cholesterol: 177 mg/dL (ref 0–200)
HDL: 37 mg/dL — ABNORMAL LOW (ref >40)
LDL Cholesterol: 70 mg/dL (ref 0–99)
Total CHOL/HDL Ratio: 4.8 RATIO
Triglycerides: 352 mg/dL — ABNORMAL HIGH (ref <150)
VLDL: 70 mg/dL — ABNORMAL HIGH (ref 0–40)

## 2021-09-18 LAB — GLUCOSE, CAPILLARY
Glucose-Capillary: 142 mg/dL — ABNORMAL HIGH (ref 70–99)
Glucose-Capillary: 215 mg/dL — ABNORMAL HIGH (ref 70–99)

## 2021-09-18 LAB — HEPATITIS B SURFACE ANTIGEN: Hepatitis B Surface Ag: NONREACTIVE

## 2021-09-18 MED ORDER — SUCROFERRIC OXYHYDROXIDE 500 MG PO CHEW
1500.0000 mg | CHEWABLE_TABLET | Freq: Three times a day (TID) | ORAL | Status: DC
  Administered 2021-09-19: 1500 mg via ORAL
  Filled 2021-09-18 (×5): qty 3

## 2021-09-18 MED ORDER — DOXERCALCIFEROL 4 MCG/2ML IV SOLN
9.0000 ug | INTRAVENOUS | Status: DC
  Administered 2021-09-18: 9 ug via INTRAVENOUS
  Filled 2021-09-18 (×2): qty 6

## 2021-09-18 MED ORDER — HEPARIN SODIUM (PORCINE) 1000 UNIT/ML DIALYSIS
1000.0000 [IU] | INTRAMUSCULAR | Status: DC | PRN
  Administered 2021-09-18: 14:00:00 1000 [IU] via INTRAVENOUS_CENTRAL
  Filled 2021-09-18 (×3): qty 1

## 2021-09-18 MED ORDER — CARVEDILOL 12.5 MG PO TABS: 25.0000 mg | ORAL_TABLET | Freq: Two times a day (BID) | ORAL | Status: DC

## 2021-09-18 MED ORDER — SODIUM CHLORIDE 0.9 % IV SOLN: 100.0000 mL | INTRAVENOUS | Status: DC | PRN

## 2021-09-18 MED ORDER — CHLORHEXIDINE GLUCONATE CLOTH 2 % EX PADS
6.0000 | MEDICATED_PAD | Freq: Every day | CUTANEOUS | Status: DC
  Administered 2021-09-19: 6 via TOPICAL

## 2021-09-18 MED ORDER — CARVEDILOL 12.5 MG PO TABS
12.5000 mg | ORAL_TABLET | Freq: Two times a day (BID) | ORAL | Status: DC
  Administered 2021-09-18 – 2021-09-19 (×2): 12.5 mg via ORAL
  Filled 2021-09-18 (×2): qty 1

## 2021-09-18 MED ORDER — CINACALCET HCL 30 MG PO TABS
60.0000 mg | ORAL_TABLET | ORAL | Status: DC
  Administered 2021-09-18: 60 mg via ORAL
  Filled 2021-09-18: qty 2

## 2021-09-18 MED ORDER — DARBEPOETIN ALFA 100 MCG/0.5ML IJ SOSY
100.0000 ug | PREFILLED_SYRINGE | INTRAMUSCULAR | Status: DC
  Administered 2021-09-18: 100 ug via INTRAVENOUS
  Filled 2021-09-18 (×2): qty 0.5

## 2021-09-18 MED ORDER — PENTAFLUOROPROP-TETRAFLUOROETH EX AERO
1.0000 "application " | INHALATION_SPRAY | CUTANEOUS | Status: DC | PRN
  Filled 2021-09-18: qty 30

## 2021-09-18 MED ORDER — LIDOCAINE HCL (PF) 1 % IJ SOLN: 5.0000 mL | INTRAMUSCULAR | Status: DC | PRN

## 2021-09-18 MED ORDER — LIDOCAINE-PRILOCAINE 2.5-2.5 % EX CREA
1.0000 "application " | TOPICAL_CREAM | CUTANEOUS | Status: DC | PRN
  Filled 2021-09-18: qty 5

## 2021-09-18 MED ORDER — ALTEPLASE 2 MG IJ SOLR: 2.0000 mg | Freq: Once | INTRAMUSCULAR | Status: DC | PRN

## 2021-09-18 NOTE — Telephone Encounter (Signed)
Pt LVM stating that will will not be able to make her appt tomorrow bc she is in [facility]. ?

## 2021-09-18 NOTE — Consult Note (Signed)
 [facility] Renal Consultation Note    Indication for Consultation:  Management of ESRD/hemodialysis; anemia, hypertension/volume and secondary hyperparathyroidism PCP:  HPI: [name] is a 66 y.o. female with ESRD on hemodialysis T,Th,S at [facility]. PMH: DMT2, morbid obesity, HTN, combined systolic and diastolic HF, CAD, necrotizing fascitis, with ostomy placement, medical noncompliance. Last OP HGB 9.2. Last HD 09/16/2021. Arrived 10.4 kgs above OP EDW, left 6.6kg above OP EDW. Came for extra treatment 09/16/2021 only stayed 57 minutes, left with 10 kg above OP EDW.   Patient presented to ED from HD center after developing SOB when placed in [place]. She arrived for treatment 10.6 kg above OP EDW. Also C/O dark stools in ostomy/bleeding from ostomy. She is taking velphoro  (Sucroferric Oxyhyudroxide) binders. On arrival to ED, HGB 8.3 Na 137 K+ 4.6 SCr 8.3 BUN 50 PO4 5.7 AGAS unremarkable. CXR Central vascular congestion and streaky basilar scarring and atelectasis. EKG SR. Troponin 15. She has been admitted for GI bleed per primary. Needs HD today.  Seen on HD. Attempting UFG 4.5 but SBP dropped to 73/43 transiently then returned to 120/70. SABRA Changed to sequential UFG lowered to 3.5 now stable without C/O SOB on RA.   Past Medical History:  Diagnosis Date   Anemia    Aortic stenosis    Echo 8/18: mean 13, peak 28, LVOT/AV mean velocity 0.51   Arthritis    Asthma    As a child    Bronchitis    CAD (coronary artery disease)    a. 09/2016: 50% Ost 1st Mrg stenosis, 50% 2nd Mrg stenosis, 20% Mid-Cx, 95% Prox LAD, 40% mid-LAD, and 10% dist-LAD stenosis. Staged PCI with DES to Prox-LAD.    Chronic combined systolic and diastolic CHF (congestive heart failure) (HCC) 2011   echo 2/18: EF 55-60, normal wall motion, grade 2 diastolic dysfunction, trivial AI // echo 3/18: Septal and apical HK, EF 45-50, normal wall motion, trivial AI, mild LAE, PASP 38  // echo 8/18: EF 60-65, normal wall motion, grade 1 diastolic dysfunction, calcified aortic valve leaflets, mild aortic stenosis (mean 13, peak 28, LVOT/AV mean velocity 0.51), mild AI, moderate MAC, mild LAE, trivial TR    Chronic kidney disease    STAGE 4   Chronic kidney disease on chronic dialysis (HCC)    t, th, sat   Complication of anesthesia    Depression    Diabetes mellitus Dx 1989   Elevated lipids    GERD (gastroesophageal reflux disease)    Gout    Heart murmur    asymptomatic   Hepatitis C Dx 2013   Hypertension Dx 1989   Infected surgical wound    Lt arm   Myocardial infarction (HCC) 07/2015   Obesity    Pancreatitis 2013   Pneumonia    Refusal of blood transfusions as patient is Jehovah's Witness    pt states she is not [place] witness and does not refuse blood products   Tendinitis    Tremors of nervous system    LEFT HAND   Ulcer 2010   Past Surgical History:  Procedure Laterality Date   A/V FISTULAGRAM Left 04/11/2019   Procedure: A/V FISTULAGRAM;  Surgeon: Jama Cordella MATSU, MD;  Location: [facility] INVASIVE CV LAB;  Service: Cardiovascular;  Laterality: Left;   A/V FISTULAGRAM Left 06/02/2019   Procedure: A/V FISTULAGRAM;  Surgeon: Jama Cordella MATSU, MD;  Location: [facility] INVASIVE CV LAB;  Service: Cardiovascular;  Laterality: Left;   APPLICATION OF WOUND  VAC Left 08/14/2017   Procedure: APPLICATION OF WOUND VAC Exchange;  Surgeon: Dessa Reyes ORN, MD;  Location: [facility] ORS;  Service: General;  Laterality: Left;   APPLICATION OF WOUND VAC Left 12/21/2018   Procedure: APPLICATION OF WOUND VAC;  Surgeon: Jama Cordella MATSU, MD;  Location: [facility] ORS;  Service: Vascular;  Laterality: Left;   AV FISTULA PLACEMENT Left 08/19/2018   Procedure: ARTERIOVENOUS (AV) FISTULA CREATION ( BRACHIOBASILIC );  Surgeon: Jama Cordella MATSU, MD;  Location: [facility] ORS;  Service: Vascular;  Laterality: Left;   BASCILIC VEIN TRANSPOSITION Left 11/18/2018   Procedure: BASCILIC VEIN  TRANSPOSITION;  Surgeon: Jama Cordella MATSU, MD;  Location: [facility] ORS;  Service: Vascular;  Laterality: Left;   BIOPSY  09/20/2020   Procedure: BIOPSY;  Surgeon: Rollin Dover, MD;  Location: [facility] ENDOSCOPY;  Service: Endoscopy;;   CHOLECYSTECTOMY     COLONOSCOPY WITH PROPOFOL  N/A 02/03/2018   Procedure: COLONOSCOPY WITH PROPOFOL ;  Surgeon: Unk Corinn Skiff, MD;  Location: [facility] ENDOSCOPY;  Service: Gastroenterology;  Laterality: N/A;   CORONARY ANGIOPLASTY  07/2015   STENT   CORONARY STENT INTERVENTION N/A 09/18/2016   Procedure: Coronary Stent Intervention;  Surgeon: Debby DELENA Sor, MD;  Location: [facility] INVASIVE CV LAB;  Service: Cardiovascular;  Laterality: N/A;   DIALYSIS/PERMA CATHETER INSERTION N/A 05/10/2018   Procedure: DIALYSIS/PERMA CATHETER INSERTION;  Surgeon: Jama Cordella MATSU, MD;  Location: [facility] INVAS[facility] CV [facility];  Service: Cardiovascular;  Laterality: N/A;   DRESSING CHANGE UNDER ANESTHESIA Left 08/15/2017   Procedure: exploration of wound for bleeding;  Surgeon: Dessa Reyes ORN, MD;  Location: [facility] ORS;  Service: General;  Laterality: Left;   ESOPHAGOGASTRODUODENOSCOPY (EGD) WITH PROPOFOL  N/A 02/03/2018   Procedure: ESOPHAGOGASTRODUODENOSCOPY (EGD) WITH PROPOFOL ;  Surgeon: Unk Corinn Skiff, MD;  Location: [facility] ENDOSCOPY;  Service: Gastroenterology;  Laterality: N/A;   ESOPHAGOGASTRODUODENOSCOPY (EGD) WITH PROPOFOL  N/A 09/20/2020   Procedure: ESOPHAGOGASTRODUODENOSCOPY (EGD) WITH PROPOFOL ;  Surgeon: Rollin Dover, MD;  Location: [facility] ENDOSCOPY;  Service: Endoscopy;  Laterality: N/A;   EYE SURGERY  11/17/2018   INCISION AND DRAINAGE ABSCESS Left 08/12/2017   Procedure: INCISION AND DRAINAGE ABSCESS;  Surgeon: Dessa Reyes ORN, MD;  Location: [facility] ORS;  Service: General;  Laterality: Left;   KNEE ARTHROSCOPY     LEFT HEART CATH N/A 09/18/2016   Procedure: Left Heart Cath;  Surgeon: Debby DELENA Sor, MD;  Location: [facility];  Service: Cardiovascular;  Laterality: N/A;   LEFT HEART  CATH AND CORONARY ANGIOGRAPHY N/A 09/16/2016   Procedure: Left Heart Cath and Coronary Angiography;  Surgeon: Lonni JONETTA Cash, MD;  Location: [facility] INVASIVE CV LAB;  Service: Cardiovascular;  Laterality: N/A;   LEFT HEART CATH AND CORONARY ANGIOGRAPHY N/A 04/29/2017   Procedure: LEFT HEART CATH AND CORONARY ANGIOGRAPHY;  Surgeon: Mady Lonni, MD;  Location: [facility] CV [facility];  Service: Cardiovascular;  Laterality: N/A;   LOWER EXTREMITY ANGIOGRAPHY Right 03/08/2018   Procedure: LOWER EXTREMITY ANGIOGRAPHY;  Surgeon: Jama Cordella MATSU, MD;  Location: [facility] INVAS[facility] CV LAB;  Service: Cardiovascular;  Laterality: Right;   TUBAL LIGATION     TUBAL LIGATION     UPPER EXTREMITY ANGIOGRAPHY Right 09/19/2019   Procedure: UPPER EXTREMITY ANGIOGRAPHY;  Surgeon: Jama Cordella MATSU, MD;  Location: [facility] INVAS[facility] CV [facility];  Service: Cardiovascular;  Laterality: Right;   WOUND DEBRIDEMENT Left 12/21/2018   Procedure: DEBRIDEMENT WOUND;  Surgeon: Jama Cordella MATSU, MD;  Location: [facility] ORS;  Service: Vascular;  Laterality: Left;   WOUND DEBRIDEMENT Left 12/30/2018   Procedure: DEBRIDEMENT WOUND  WITH VAC PLACEMENT (LEFT UPPER EXTREMITY);  Surgeon: Jama Cordella MATSU, MD;  Location: [facility] ORS;  Service: Vascular;  Laterality: Left;   Family History  Problem Relation Age of Onset   Colon cancer Mother    Heart attack Other    Heart attack Maternal Grandmother    Hypertension Sister    Hypertension Brother    Diabetes Paternal Grandmother    Breast cancer Neg Hx    Social History:  reports that she quit smoking about 40 years ago. Her smoking use included cigarettes. She has a 1.50 pack-year smoking history. She has never used smokeless tobacco. She reports current alcohol use. She reports that she does not currently use drugs after having used the following drugs: Marijuana. Allergies  Allergen Reactions   Morphine  Itching    Tolerated hydromorphone  on 07/2020   Shellfish Allergy Anaphylaxis and Swelling    Diazepam  Other (See Comments)    felt like out of body experience   Prior to Admission medications   Medication Sig Start Date End Date Taking? Authorizing Provider  albuterol  (VENTOLIN  HFA) 108 (90 Base) MCG/ACT inhaler Inhale 2 puffs into the lungs every 4 (four) hours as needed for wheezing or shortness of breath. 06/28/20  Yes Burnette, Delon HERO, PA-C  allopurinol  (ZYLOPRIM ) 100 MG tablet TAKE 1 TABLET BY MOUTH ONCE DAILY. Patient taking differently: Take 100 mg by mouth daily. 07/16/21  Yes Gasper Nancyann BRAVO, MD  aspirin  81 MG EC tablet Take 1 tablet (81 mg total) by mouth daily. 09/20/20  Yes Singh, Prashant K, MD  atorvastatin  (LIPITOR ) 80 MG tablet Take 1 tablet (80 mg total) by mouth every evening. 04/24/21  Yes Bacigalupo, Jon HERO, MD  AURYXIA  1 GM 210 MG(Fe) tablet Take 2 tablets (420 mg total) by mouth 3 (three) times daily with meals. 05/03/20  Yes Vivienne Delon M, PA-C  B Complex-C-Folic Acid  (RENA-VITE RX) 1 MG TABS Take 1 mg by mouth daily. 10/15/20  Yes [provider]  carvedilol  (COREG ) 25 MG tablet Take 1 tablet (25 mg total) by mouth 2 (two) times daily with a meal. 10/29/20  Yes Bacigalupo, Jon HERO, MD  cyclobenzaprine  (FLEXERIL ) 10 MG tablet Take 1 tablet (10 mg total) by mouth 2 (two) times daily as needed for muscle spasms. 10/24/20  Yes Landy Honora CROME, PA-C  diclofenac  Sodium (VOLTAREN ) 1 % GEL Apply 2 g topically 4 (four) times daily. 10/24/20  Yes Landy Honora CROME, PA-C  docusate sodium  (COLACE) 100 MG capsule Take 1 capsule (100 mg total) by mouth 2 (two) times daily as needed for mild constipation. 09/20/17  Yes Gouru, Aruna, MD  fluticasone  (FLONASE ) 50 MCG/ACT nasal spray Place 2 sprays into both nostrils daily as needed for allergies or rhinitis. 09/28/17  Yes Vivienne Delon HERO, PA-C  fluticasone  furoate-vilanterol (BREO ELLIPTA ) 200-25 MCG/INH AEPB Inhale 1 puff into the lungs daily. Patient taking differently: Inhale 1 puff into the lungs daily as  needed (wheezing). 08/30/20  Yes Vivienne Delon HERO, PA-C  hydrALAZINE  (APRESOLINE ) 100 MG tablet Take 100 mg by mouth 3 (three) times daily. 10/07/20  Yes [provider]  hydrOXYzine  (ATARAX /VISTARIL ) 25 MG tablet Take 1 tablet (25 mg total) by mouth 4 (four) times daily. Patient taking differently: Take 25 mg by mouth 3 (three) times daily. 12/03/20  Yes Bacigalupo, Angela M, MD  Insulin  NPH, Human,, Isophane, (NOVOLIN  N FLEXPEN) 100 UNIT/ML Kiwkpen Inject 140 Units into the skin every morning. And 32G pen needles 1/day Patient taking differently: Inject 140 Units  into the skin every morning. 09/05/21  Yes Kassie Mallick, MD  loratadine  (CLARITIN ) 10 MG tablet TAKE 1 TABLET BY MOUTH ONCE DAILY. Patient taking differently: Take 10 mg by mouth daily as needed for allergies. 07/15/20  Yes Vivienne Delon HERO, PA-C  montelukast  (SINGULAIR ) 10 MG tablet TAKE ONE TABLET BY MOUTH AT BEDTIME. Patient taking differently: Take 10 mg by mouth at bedtime. 07/15/20  Yes Vivienne Delon HERO, PA-C  nitroGLYCERIN  (NITROSTAT ) 0.4 MG SL tablet Place 1 tablet (0.4 mg total) under the tongue every 5 (five) minutes x 3 doses as needed for chest pain. 05/03/20  Yes Vivienne Delon HERO, PA-C  Nutritional Supplements (FEEDING SUPPLEMENT, NEPRO CARB STEADY,) LIQD Take 237 mLs by mouth daily. 02/01/20  Yes Wieting, Richard, MD  nystatin  cream (MYCOSTATIN ) Apply 1 application topically 2 (two) times daily. Patient taking differently: Apply 1 application. topically daily as needed for dry skin. 08/10/19  Yes Vivienne Delon HERO, PA-C  omeprazole  (PRILOSEC) 20 MG capsule Take 20 mg by mouth daily. 10/07/20  Yes [provider]  oxyCODONE -acetaminophen  (PERCOCET) 7.5-325 MG tablet Take 1 tablet by mouth 3 (three) times daily. 08/27/21  Yes [provider]  pantoprazole  (PROTONIX ) 40 MG tablet Take 1 tablet (40 mg total) by mouth 2 (two) times daily. 09/24/20  Yes Singh, Prashant K, MD  pregabalin  (LYRICA )  150 MG capsule Take 1 capsule (150 mg total) by mouth 2 (two) times daily. 09/27/20  Yes Burnette, Jennifer M, PA-C  SENNA-PLUS 8.6-50 MG tablet TAKE ONE TABLET BY MOUTH AT BEDTIME. Patient taking differently: Take 1 tablet by mouth at bedtime. 07/15/20  Yes Vivienne Delon HERO, PA-C  sucralfate  (CARAFATE ) 1 g tablet Take 1 tablet (1 g total) by mouth 2 (two) times daily for 26 days. Patient taking differently: Take 1 g by mouth daily. 09/24/20 09/17/21 Yes Singh, Prashant K, MD  valACYclovir  (VALTREX ) 500 MG tablet TAKE (1) TABLET BY MOUTH EVERY OTHER DAY. Patient taking differently: Take 500 mg by mouth every other day. 01/24/21  Yes Bacigalupo, Angela M, MD  VELPHORO  500 MG chewable tablet Chew 1,500 mg by mouth 3 (three) times daily. 09/11/21  Yes [provider]  Vitamin D , Ergocalciferol , (DRISDOL ) 1.25 MG (50000 UNIT) CAPS capsule TAKE 1 CAPSULE BY MOUTH ONCE A MONTH Patient taking differently: Take 50,000 Units by mouth every 30 (thirty) days. 11/04/20  Yes Gasper Nancyann BRAVO, MD  buPROPion  (WELLBUTRIN  SR) 150 MG 12 hr tablet Take 1 tablet (150 mg total) by mouth 2 (two) times daily. Patient not taking: Reported on 09/17/2021 05/03/20   Vivienne Delon HERO, PA-C  colchicine  0.6 MG tablet Take 1 tablet (0.6 mg total) by mouth once a week for 15 days. Patient not taking: Reported on 09/17/2021 09/29/20 10/14/20  Singh, Prashant K, MD  Continuous Blood Gluc Receiver (FREESTYLE LIBRE 14 DAY READER) DEVI To check blood sugar ACHS 09/27/20   Vivienne Delon HERO, PA-C  Continuous Blood Gluc Sensor (FREESTYLE LIBRE 14 DAY SENSOR) MISC To check blood sugar ACHS; change every 14 days 09/27/20   Vivienne Delon HERO, PA-C  dicyclomine  (BENTYL ) 20 MG tablet Take 1 tablet (20 mg total) by mouth in the morning and at bedtime. Patient not taking: Reported on 09/17/2021 09/27/20   Burnette, Jennifer M, PA-C  doxepin  (SINEQUAN ) 10 MG capsule TAKE 1 CAPSULE(10 MG) BY MOUTH FOUR TIMES DAILY AS NEEDED FOR ITCHING Patient  not taking: Reported on 09/17/2021 05/03/20   Burnette, Jennifer M, PA-C  lidocaine -prilocaine  (EMLA ) cream Apply 1 application topically as  needed. Patient not taking: Reported on 09/17/2021 03/27/19   Schnier, Cordella MATSU, MD  oxyCODONE  (ROXICODONE ) 5 MG immediate release tablet Take 1 tablet (5 mg total) by mouth every 12 (twelve) hours as needed for severe pain. Patient not taking: Reported on 09/17/2021 08/30/20   Burnette, Jennifer M, PA-C  oxyCODONE -acetaminophen  (PERCOCET/ROXICET) 5-325 MG tablet Take 1 tablet by mouth every 8 (eight) hours as needed. Patient not taking: Reported on 09/17/2021 04/23/21   Singh, Prashant K, MD  torsemide  (DEMADEX ) 20 MG tablet TAKE (2) TABLETS BY MOUTH TWICE DAILY. Patient taking differently: Take 40 mg by mouth See admin instructions. Take 2 tablet two times every other day. 01/24/21   Bacigalupo, Angela M, MD  triamcinolone  cream (KENALOG ) 0.1 % APPLY TO AFFECTED AREA TWICE DAILY Patient not taking: Reported on 09/17/2021 06/17/18   Burnette, Jennifer M, PA-C  ULTICARE SHORT PEN NEEDLES 31G X 8 MM MISC USE THREE TIMES DAILY WITH INSULIN  01/21/21   Bertrum Charlie LITTIE Mickey., MD  gabapentin  (NEURONTIN ) 100 MG capsule Take 1 capsule (100 mg total) by mouth 3 (three) times daily. 08/19/17 02/03/19  Patel, Sona, MD   Current Facility-Administered Medications  Medication Dose Route Frequency Provider Last Rate Last Admin   0.9 %  sodium chloride  infusion  100 mL Intravenous PRN Delores Ricka Maidens, NP       0.9 %  sodium chloride  infusion  100 mL Intravenous PRN Mercer Stallworth Harbison, NP       albuterol  (PROVENTIL ) (2.5 MG/3ML) 0.083% nebulizer solution 3 mL  3 mL Inhalation Q4H PRN Orlando Pond, DO       allopurinol  (ZYLOPRIM ) tablet 100 mg  100 mg Oral Daily Dahbura, Anton, DO   100 mg at 09/18/21 9162   alteplase  (CATHFLO ACTIVASE ) injection 2 mg  2 mg Intracatheter Once PRN Delores Ricka Maidens, NP       atorvastatin  (LIPITOR ) tablet 80 mg  80 mg Oral QPM Dahbura, Anton, DO    80 mg at 09/18/21 0211   carvedilol  (COREG ) tablet 12.5 mg  12.5 mg Oral BID WC Dameron, Marisa, DO       Chlorhexidine  Gluconate Cloth 2 % PADS 6 each  6 each Topical Q0600 Delores Ricka Maidens, NP       diclofenac  Sodium (VOLTAREN ) 1 % topical gel 2 g  2 g Topical QID PRN Dahbura, Anton, DO       fluticasone  (FLONASE ) 50 MCG/ACT nasal spray 2 spray  2 spray Each Nare Daily PRN Dahbura, Anton, DO       fluticasone  furoate-vilanterol (BREO ELLIPTA ) 200-25 MCG/ACT 1 puff  1 puff Inhalation Daily Dahbura, Anton, DO   1 puff at 09/18/21 0835   heparin  injection 1,000 Units  1,000 Units Dialysis PRN Delores Ricka Maidens, NP   1,000 Units at 09/18/21 1419   hydrOXYzine  (ATARAX ) tablet 25 mg  25 mg Oral TID PRN Dahbura, Anton, DO       insulin  aspart (novoLOG ) injection 0-9 Units  0-9 Units Subcutaneous TID WC Orlando Pond, DO   2 Units at 09/18/21 1220   lidocaine  (PF) (XYLOCAINE ) 1 % injection 5 mL  5 mL Intradermal PRN Delores Ricka Maidens, NP       lidocaine -prilocaine  (EMLA ) cream 1 application.  1 application. Topical PRN Zakhari Fogel Harbison, NP       loratadine  (CLARITIN ) tablet 10 mg  10 mg Oral Daily PRN Dahbura, Anton, DO       montelukast  (SINGULAIR ) tablet 10 mg  10 mg Oral QHS Dahbura,  Kieth, DO       multivitamin (RENA-VIT) tablet 1 tablet  1 tablet Oral QHS Dahbura, Anton, DO       oxyCODONE  (Oxy IR/ROXICODONE ) immediate release tablet 5 mg  5 mg Oral BID PRN Dahbura, Anton, DO   5 mg at 09/18/21 1216   pantoprozole (PROTONIX ) 80 mg /NS 100 mL infusion  8 mg/hr Intravenous Continuous Dahbura, Anton, DO 10 mL/hr at 09/18/21 0842 8 mg/hr at 09/18/21 0842   pentafluoroprop-tetrafluoroeth (GEBAUERS) aerosol 1 application.  1 application. Topical PRN Letty Salvi Harbison, NP       Current Outpatient Medications  Medication Sig Dispense Refill   albuterol  (VENTOLIN  HFA) 108 (90 Base) MCG/ACT inhaler Inhale 2 puffs into the lungs every 4 (four) hours as needed for wheezing or shortness of  breath. 18 g 5   allopurinol  (ZYLOPRIM ) 100 MG tablet TAKE 1 TABLET BY MOUTH ONCE DAILY. (Patient taking differently: Take 100 mg by mouth daily.) 30 tablet 5   aspirin  81 MG EC tablet Take 1 tablet (81 mg total) by mouth daily. 30 tablet 12   atorvastatin  (LIPITOR ) 80 MG tablet Take 1 tablet (80 mg total) by mouth every evening. 90 tablet 0   AURYXIA  1 GM 210 MG(Fe) tablet Take 2 tablets (420 mg total) by mouth 3 (three) times daily with meals. 540 tablet 3   B Complex-C-Folic Acid  (RENA-VITE RX) 1 MG TABS Take 1 mg by mouth daily.     carvedilol  (COREG ) 25 MG tablet Take 1 tablet (25 mg total) by mouth 2 (two) times daily with a meal. 180 tablet 1   cyclobenzaprine  (FLEXERIL ) 10 MG tablet Take 1 tablet (10 mg total) by mouth 2 (two) times daily as needed for muscle spasms. 20 tablet 0   diclofenac  Sodium (VOLTAREN ) 1 % GEL Apply 2 g topically 4 (four) times daily. 100 g 0   docusate sodium  (COLACE) 100 MG capsule Take 1 capsule (100 mg total) by mouth 2 (two) times daily as needed for mild constipation. 30 capsule 0   fluticasone  (FLONASE ) 50 MCG/ACT nasal spray Place 2 sprays into both nostrils daily as needed for allergies or rhinitis. 48 g 1   fluticasone  furoate-vilanterol (BREO ELLIPTA ) 200-25 MCG/INH AEPB Inhale 1 puff into the lungs daily. (Patient taking differently: Inhale 1 puff into the lungs daily as needed (wheezing).) 60 each 0   hydrALAZINE  (APRESOLINE ) 100 MG tablet Take 100 mg by mouth 3 (three) times daily.     hydrOXYzine  (ATARAX /VISTARIL ) 25 MG tablet Take 1 tablet (25 mg total) by mouth 4 (four) times daily. (Patient taking differently: Take 25 mg by mouth 3 (three) times daily.) 360 tablet 1   Insulin  NPH, Human,, Isophane, (NOVOLIN  N FLEXPEN) 100 UNIT/ML Kiwkpen Inject 140 Units into the skin every morning. And 32G pen needles 1/day (Patient taking differently: Inject 140 Units into the skin every morning.) 150 mL 3   loratadine  (CLARITIN ) 10 MG tablet TAKE 1 TABLET BY MOUTH  ONCE DAILY. (Patient taking differently: Take 10 mg by mouth daily as needed for allergies.) 90 tablet 3   montelukast  (SINGULAIR ) 10 MG tablet TAKE ONE TABLET BY MOUTH AT BEDTIME. (Patient taking differently: Take 10 mg by mouth at bedtime.) 90 tablet 3   nitroGLYCERIN  (NITROSTAT ) 0.4 MG SL tablet Place 1 tablet (0.4 mg total) under the tongue every 5 (five) minutes x 3 doses as needed for chest pain. 25 tablet 12   Nutritional Supplements (FEEDING SUPPLEMENT, NEPRO CARB STEADY,) LIQD Take 237 mLs by mouth  daily. 7110 mL 0   nystatin  cream (MYCOSTATIN ) Apply 1 application topically 2 (two) times daily. (Patient taking differently: Apply 1 application. topically daily as needed for dry skin.) 30 g 0   omeprazole  (PRILOSEC) 20 MG capsule Take 20 mg by mouth daily.     oxyCODONE -acetaminophen  (PERCOCET) 7.5-325 MG tablet Take 1 tablet by mouth 3 (three) times daily.     pantoprazole  (PROTONIX ) 40 MG tablet Take 1 tablet (40 mg total) by mouth 2 (two) times daily. 60 tablet 2   pregabalin  (LYRICA ) 150 MG capsule Take 1 capsule (150 mg total) by mouth 2 (two) times daily. 180 capsule 1   SENNA-PLUS 8.6-50 MG tablet TAKE ONE TABLET BY MOUTH AT BEDTIME. (Patient taking differently: Take 1 tablet by mouth at bedtime.) 90 tablet 3   sucralfate  (CARAFATE ) 1 g tablet Take 1 tablet (1 g total) by mouth 2 (two) times daily for 26 days. (Patient taking differently: Take 1 g by mouth daily.) 52 tablet 0   valACYclovir  (VALTREX ) 500 MG tablet TAKE (1) TABLET BY MOUTH EVERY OTHER DAY. (Patient taking differently: Take 500 mg by mouth every other day.) 45 tablet 1   VELPHORO  500 MG chewable tablet Chew 1,500 mg by mouth 3 (three) times daily.     Vitamin D , Ergocalciferol , (DRISDOL ) 1.25 MG (50000 UNIT) CAPS capsule TAKE 1 CAPSULE BY MOUTH ONCE A MONTH (Patient taking differently: Take 50,000 Units by mouth every 30 (thirty) days.) 1 capsule 11   buPROPion  (WELLBUTRIN  SR) 150 MG 12 hr tablet Take 1 tablet (150 mg  total) by mouth 2 (two) times daily. (Patient not taking: Reported on 09/17/2021) 180 tablet 3   colchicine  0.6 MG tablet Take 1 tablet (0.6 mg total) by mouth once a week for 15 days. (Patient not taking: Reported on 09/17/2021) 2 tablet 0   Continuous Blood Gluc Receiver (FREESTYLE LIBRE 14 DAY READER) DEVI To check blood sugar ACHS 1 each 0   Continuous Blood Gluc Sensor (FREESTYLE LIBRE 14 DAY SENSOR) MISC To check blood sugar ACHS; change every 14 days 6 each 11   dicyclomine  (BENTYL ) 20 MG tablet Take 1 tablet (20 mg total) by mouth in the morning and at bedtime. (Patient not taking: Reported on 09/17/2021) 180 tablet 1   doxepin  (SINEQUAN ) 10 MG capsule TAKE 1 CAPSULE(10 MG) BY MOUTH FOUR TIMES DAILY AS NEEDED FOR ITCHING (Patient not taking: Reported on 09/17/2021) 360 capsule 3   lidocaine -prilocaine  (EMLA ) cream Apply 1 application topically as needed. (Patient not taking: Reported on 09/17/2021) 30 g 11   oxyCODONE  (ROXICODONE ) 5 MG immediate release tablet Take 1 tablet (5 mg total) by mouth every 12 (twelve) hours as needed for severe pain. (Patient not taking: Reported on 09/17/2021) 60 tablet 0   oxyCODONE -acetaminophen  (PERCOCET/ROXICET) 5-325 MG tablet Take 1 tablet by mouth every 8 (eight) hours as needed. (Patient not taking: Reported on 09/17/2021) 12 tablet 0   torsemide  (DEMADEX ) 20 MG tablet TAKE (2) TABLETS BY MOUTH TWICE DAILY. (Patient taking differently: Take 40 mg by mouth See admin instructions. Take 2 tablet two times every other day.) 360 tablet 1   triamcinolone  cream (KENALOG ) 0.1 % APPLY TO AFFECTED AREA TWICE DAILY (Patient not taking: Reported on 09/17/2021) 80 g 0   ULTICARE SHORT PEN NEEDLES 31G X 8 MM MISC USE THREE TIMES DAILY WITH INSULIN  100 each 0   Labs: Basic Metabolic Panel: Recent Labs  Lab 09/17/21 1453 09/18/21 0332 09/18/21 1319  NA 136 140 137  K 4.2  4.3 4.6  CL 95* 101 99  CO2 28 25 23   GLUCOSE 227* 167* 152*  BUN 36* 41* 50*  CREATININE 6.88* 7.73* 8.30*   CALCIUM  8.0* 8.0* 8.1*  PHOS  --   --  5.7*   Liver Function Tests: Recent Labs  Lab 09/18/21 1319  ALBUMIN  3.0*   No results for input(s): LIPASE, AMYLASE in the last 168 hours. No results for input(s): AMMONIA in the last 168 hours. CBC: Recent Labs  Lab 09/17/21 1453 09/18/21 0332 09/18/21 1318  WBC 7.1 6.0 6.5  NEUTROABS 5.0  --   --   HGB 8.5* 8.5* 8.3*  HCT 27.5* 27.7* 26.7*  MCV 94.8 96.5 94.3  PLT 193 198 195   Cardiac Enzymes: No results for input(s): CKTOTAL, CKMB, CKMBINDEX, TROPONINI in the last 168 hours. CBG: Recent Labs  Lab 09/18/21 0833 09/18/21 1220  GLUCAP 172* 156*   Iron  Studies: No results for input(s): IRON , TIBC, TRANSFERRIN, FERRITIN in the last 72 hours. Studies/Results: DG Chest Portable 1 View  Result Date: 09/17/2021 CLINICAL DATA:  Shortness of breath during dialysis. EXAM: PORTABLE CHEST 1 VIEW COMPARISON:  04/21/2021 FINDINGS: The heart is within normal limits in size given the AP projection portable technique. Stable coronary artery stent. Central vascular congestion and streaky basilar scarring and atelectasis but no overt pulmonary edema or pleural effusions. IMPRESSION: Central vascular congestion and streaky basilar scarring and atelectasis. No pulmonary edema or pleural effusions. Electronically Signed   By: MYRTIS Stammer M.D.   On: 09/17/2021 14:47    ROS: As per HPI otherwise negative.   Physical Exam: Vitals:   09/18/21 1300 09/18/21 1330 09/18/21 1409 09/18/21 1433  BP: (!) 133/46 127/62 (!) 111/37 (!) 120/32  Pulse: 79 79 (!) 48 80  Resp: 15 15 20  (!) 24  Temp:      TempSrc:      SpO2: 100% 100% 100% 100%  Weight:      Height:         General: Obese female in no acute distress. Head: Normocephalic, atraumatic, sclera non-icteric, mucus membranes are moist Neck: Supple. JVD difficult to assess D/T body habitus.  Lungs: Slightly decreased in bases otherwise CTAB. No WOB. On RA. SABRA Heart: S1.S2 RRR 2/6 systolic M.   Abdomen: Obese, NABS. Ostomy with small amount dark stool in pouch. No frank blood noted.  M-S:  Strength and tone appear normal for age. Lower extremities: 2+ RLE edema 1+ LLE edema.  Neuro: Alert and oriented X 3. Moves all extremities spontaneously. Psych:  Responds to questions appropriately with a normal affect. Dialysis Access: L AVF cannulated at present  Dialysis Orders: Center: [facility] T,Th,S 4 hrs 180NRe 450/600 119 kg 2.0 K/2.0 Ca AVF -No heparin  -Hectorol  9 mcg IV TIW  -Micera 60 mcg IV q 2 weeks (last dose 09/04/2021) -Sensipar  60 mg PO TIW  Assessment/Plan:  GI Bleed: HGB 8.3 in setting of anemia of ESRD. No ESA since 09/04/2021, is due today. Give Aranesp  100 mcg IV with HD today. Last OP HGB 9.3. On Velphoro  binders which turns stool black. W/U per primary.   ESRD -  T,Th,S. Chronic issues with high IDWG/missed and truncated treatments. HD today on schedule. Next HD 09/20/2021.   Hypertension/volume  -Chronic noncompliance with sodium and fluid restrictions. Was ordered extra tx 03/08/2023his week after she arrived 10.4 kg over EDW  09/16/2021. Did not stay for extra tx-gained additional 4 kgs  between treatments. BP transiently dropped, placed in sequential to continue volume  removal. Tolerating so far. Usually hypertensive on carvedilol , hydralazine .   Anemia  - See # 1  Metabolic bone disease -  PO4 8.9 09/13/2021 at OP Clinic. Continue binders, VDRA, sensipar .   Nutrition - NPO at present. Renal Carb mod diet when able to eat.   DMT2-uncontrolled on high insulin  dose. Per primary  Chronic systolic and diastolic HF: Attempt to optimize volume but patient is noncompliant with fluid restrictions, sodium restrictions and HD.  CAD-H/O stents. Per primary  Ricka DEL. Delores, NP-C 09/18/2021, 2:57 PM  [employer] [phone]

## 2021-09-18 NOTE — Consult Note (Signed)
UNASSIGNED CONSULT  Reason for Consult: GI bleed Referring Physician: [facility]  Otie M Bamford HPI: This is a 66 year old female with a PMH of two nonbleeding clean-based duodenal bulb ulcers (09/20/2020), chronic anemia, ESRD, necrotizing fascitis s/p colostomy, and multiple other medical problems admitted for complaints of SOB.  He was noted to be retaining more fluid and she is 10 kg over her baseline.  During her dialysis today she was SOB and she was not able to complete the session.  The patient also reports having some bleeding around her colostomy.  Last year she complained about having melena and the EGD was positive for the nonbleeding ulcers.  Her GI physician at [facility] performed an EGD/colonoscopy 01/2018.  The colonoscopy was for screening purposes and it showed a small tubular adenoma.    Past Medical History:  Diagnosis Date   Anemia    Aortic stenosis    Echo 8/18: mean 13, peak 28, LVOT/AV mean velocity 0.51   Arthritis    Asthma    As a child    Bronchitis    CAD (coronary artery disease)    a. 09/2016: 50% Ost 1st Mrg stenosis, 50% 2nd Mrg stenosis, 20% Mid-Cx, 95% Prox LAD, 40% mid-LAD, and 10% dist-LAD stenosis. Staged PCI with DES to Prox-LAD.    Chronic combined systolic and diastolic CHF (congestive heart failure) ([place]) 2011   echo 2/18: EF 55-60, normal wall motion, grade 2 diastolic dysfunction, trivial AI // echo 3/18: Septal and apical HK, EF 45-50, normal wall motion, trivial AI, mild LAE, PASP 38 // echo 8/18: EF 60-65, normal wall motion, grade 1 diastolic dysfunction, calcified aortic valve leaflets, mild aortic stenosis (mean 13, peak 28, LVOT/AV mean velocity 0.51), mild AI, moderate MAC, mild LAE, trivial TR    Chronic kidney disease    STAGE 4   Chronic kidney disease on chronic dialysis (HCC)    t, th, sat   Complication of anesthesia    Depression    Diabetes mellitus Dx 1989   Elevated lipids    GERD (gastroesophageal reflux disease)    Gout     Heart murmur    asymptomatic   Hepatitis C Dx 2013   Hypertension Dx 1989   Infected surgical wound    Lt arm   Myocardial infarction ([place]) 07/2015   Obesity    Pancreatitis 2013   Pneumonia    Refusal of blood transfusions as patient is Jehovah's Witness    pt states she is not [place] witness and does not refuse blood products   Tendinitis    Tremors of nervous system    LEFT HAND   Ulcer 2010    Past Surgical History:  Procedure Laterality Date   A/V FISTULAGRAM Left 04/11/2019   Procedure: A/V FISTULAGRAM;  Surgeon: Katha Cabal, MD;  Location: [place] [facility];  Service: Cardiovascular;  Laterality: Left;   A/V FISTULAGRAM Left 06/02/2019   Procedure: A/V FISTULAGRAM;  Surgeon: Katha Cabal, MD;  Location: [place] [facility];  Service: Cardiovascular;  Laterality: Left;   APPLICATION OF WOUND VAC Left 08/14/2017   Procedure: APPLICATION OF WOUND VAC Exchange;  Surgeon: Robert Bellow, MD;  Location: [facility];  Service: General;  Laterality: Left;   APPLICATION OF WOUND VAC Left 12/21/2018   Procedure: APPLICATION OF WOUND VAC;  Surgeon: Katha Cabal, MD;  Location: [facility];  Service: Vascular;  Laterality: Left;   AV FISTULA PLACEMENT Left 08/19/2018   Procedure: ARTERIOVENOUS (AV)  FISTULA CREATION ( BRACHIOBASILIC );  Surgeon: Katha Cabal, MD;  Location: [facility];  Service: Vascular;  Laterality: Left;   BASCILIC VEIN TRANSPOSITION Left 11/18/2018   Procedure: BASCILIC VEIN TRANSPOSITION;  Surgeon: Katha Cabal, MD;  Location: [facility];  Service: Vascular;  Laterality: Left;   BIOPSY  09/20/2020   Procedure: BIOPSY;  Surgeon: Carol Ada, MD;  Location: [place];  Service: Endoscopy;;   CHOLECYSTECTOMY     COLONOSCOPY WITH PROPOFOL N/A 02/03/2018   Procedure: COLONOSCOPY WITH PROPOFOL;  Surgeon: Lin Landsman, MD;  Location: [facility];  Service: Gastroenterology;  Laterality: N/A;   CORONARY ANGIOPLASTY  07/2015   STENT    CORONARY STENT INTERVENTION N/A 09/18/2016   Procedure: Coronary Stent Intervention;  Surgeon: Troy Sine, MD;  Location: [place] [facility];  Service: Cardiovascular;  Laterality: N/A;   DIALYSIS/PERMA CATHETER INSERTION N/A 05/10/2018   Procedure: DIALYSIS/PERMA CATHETER INSERTION;  Surgeon: Katha Cabal, MD;  Location: [place] [facility];  Service: Cardiovascular;  Laterality: N/A;   DRESSING CHANGE UNDER ANESTHESIA Left 08/15/2017   Procedure: exploration of wound for bleeding;  Surgeon: Robert Bellow, MD;  Location: [facility];  Service: General;  Laterality: Left;   ESOPHAGOGASTRODUODENOSCOPY (EGD) WITH PROPOFOL N/A 02/03/2018   Procedure: ESOPHAGOGASTRODUODENOSCOPY (EGD) WITH PROPOFOL;  Surgeon: Lin Landsman, MD;  Location: [facility];  Service: Gastroenterology;  Laterality: N/A;   ESOPHAGOGASTRODUODENOSCOPY (EGD) WITH PROPOFOL N/A 09/20/2020   Procedure: ESOPHAGOGASTRODUODENOSCOPY (EGD) WITH PROPOFOL;  Surgeon: Carol Ada, MD;  Location: [place];  Service: Endoscopy;  Laterality: N/A;   EYE SURGERY  11/17/2018   INCISION AND DRAINAGE ABSCESS Left 08/12/2017   Procedure: INCISION AND DRAINAGE ABSCESS;  Surgeon: Robert Bellow, MD;  Location: [facility];  Service: General;  Laterality: Left;   KNEE ARTHROSCOPY     LEFT HEART CATH N/A 09/18/2016   Procedure: Left Heart Cath;  Surgeon: Troy Sine, MD;  Location: [place] [facility];  Service: Cardiovascular;  Laterality: N/A;   LEFT HEART CATH AND CORONARY ANGIOGRAPHY N/A 09/16/2016   Procedure: Left Heart Cath and Coronary Angiography;  Surgeon: Burnell Blanks, MD;  Location: [place] [facility];  Service: Cardiovascular;  Laterality: N/A;   LEFT HEART CATH AND CORONARY ANGIOGRAPHY N/A 04/29/2017   Procedure: LEFT HEART CATH AND CORONARY ANGIOGRAPHY;  Surgeon: Nelva Bush, MD;  Location: [place] [facility];  Service: Cardiovascular;  Laterality: N/A;   LOWER EXTREMITY ANGIOGRAPHY Right 03/08/2018    Procedure: LOWER EXTREMITY ANGIOGRAPHY;  Surgeon: Katha Cabal, MD;  Location: [place] [facility];  Service: Cardiovascular;  Laterality: Right;   TUBAL LIGATION     TUBAL LIGATION     UPPER EXTREMITY ANGIOGRAPHY Right 09/19/2019   Procedure: UPPER EXTREMITY ANGIOGRAPHY;  Surgeon: Katha Cabal, MD;  Location: [place] [facility];  Service: Cardiovascular;  Laterality: Right;   WOUND DEBRIDEMENT Left 12/21/2018   Procedure: DEBRIDEMENT WOUND;  Surgeon: Katha Cabal, MD;  Location: [facility];  Service: Vascular;  Laterality: Left;   WOUND DEBRIDEMENT Left 12/30/2018   Procedure: DEBRIDEMENT WOUND WITH VAC PLACEMENT (LEFT UPPER EXTREMITY);  Surgeon: Katha Cabal, MD;  Location: [facility];  Service: Vascular;  Laterality: Left;    Family History  Problem Relation Age of Onset   Colon cancer Mother    Heart attack Other    Heart attack Maternal Grandmother    Hypertension Sister    Hypertension Brother    Diabetes Paternal Grandmother  Breast cancer Neg Hx     Social History:  reports that she quit smoking about 40 years ago. Her smoking use included cigarettes. She has a 1.50 pack-year smoking history. She has never used smokeless tobacco. She reports current alcohol use. She reports that she does not currently use drugs after having used the following drugs: Marijuana.  Allergies:  Allergies  Allergen Reactions   Morphine Itching    Tolerated hydromorphone on 07/2020   Shellfish Allergy Anaphylaxis and Swelling   Diazepam Other (See Comments)    "felt like out of body experience"    Medications: Scheduled:  allopurinol  100 mg Oral Daily   atorvastatin  80 mg Oral QPM   carvedilol  12.5 mg Oral BID WC   Chlorhexidine Gluconate Cloth  6 each Topical Q0600   cinacalcet  60 mg Oral Q T,Th,Sa-HD   darbepoetin (ARANESP) injection - DIALYSIS  100 mcg Intravenous Q Thu-HD   doxercalciferol  9 mcg Intravenous Q T,Th,Sa-HD   fluticasone furoate-vilanterol  1 puff  Inhalation Daily   insulin aspart  0-9 Units Subcutaneous TID WC   montelukast  10 mg Oral QHS   multivitamin  1 tablet Oral QHS   sucroferric oxyhydroxide  1,500 mg Oral TID WC   Continuous:  sodium chloride     sodium chloride     pantoprazole 8 mg/hr (09/18/21 0842)    Results for orders placed or performed during the hospital encounter of 09/17/21 (from the past 24 hour(s))  Troponin I (High Sensitivity)     Status: None   Collection Time: 09/17/21  5:08 PM  Result Value Ref Range   Troponin I (High Sensitivity) 15 <18 ng/L  Type and screen [place]     Status: None   Collection Time: 09/17/21  5:08 PM  Result Value Ref Range   ABO/RH(D) A POS    Antibody Screen NEG    Sample Expiration      09/20/2021,2359 Performed at [facility], [address] [place], [place] [address]   Resp Panel by RT-PCR (Flu A&B, Covid) Nasopharyngeal Swab     Status: None   Collection Time: 09/17/21  6:06 PM   Specimen: Nasopharyngeal Swab; Nasopharyngeal(NP) swabs in vial transport medium  Result Value Ref Range   SARS Coronavirus 2 by RT PCR NEGATIVE NEGATIVE   Influenza A by PCR NEGATIVE NEGATIVE   Influenza B by PCR NEGATIVE NEGATIVE  Basic metabolic panel     Status: Abnormal   Collection Time: 09/18/21  3:32 AM  Result Value Ref Range   Sodium 140 135 - 145 mmol/L   Potassium 4.3 3.5 - 5.1 mmol/L   Chloride 101 98 - 111 mmol/L   CO2 25 22 - 32 mmol/L   Glucose, Bld 167 (H) 70 - 99 mg/dL   BUN 41 (H) 8 - 23 mg/dL   Creatinine, Ser 7.73 (H) 0.44 - 1.00 mg/dL   Calcium 8.0 (L) 8.9 - 10.3 mg/dL   GFR, Estimated 5 (L) >60 mL/min   Anion gap 14 5 - 15  CBC     Status: Abnormal   Collection Time: 09/18/21  3:32 AM  Result Value Ref Range   WBC 6.0 4.0 - 10.5 K/uL   RBC 2.87 (L) 3.87 - 5.11 MIL/uL   Hemoglobin 8.5 (L) 12.0 - 15.0 g/dL   HCT 27.7 (L) 36.0 - 46.0 %   MCV 96.5 80.0 - 100.0 fL   MCH 29.6 26.0 - 34.0 pg  MCHC 30.7 30.0 - 36.0 g/dL   RDW 15.9  (H) 11.5 - 15.5 %   Platelets 198 150 - 400 K/uL   nRBC 0.0 0.0 - 0.2 %  Lipid panel     Status: Abnormal   Collection Time: 09/18/21  3:32 AM  Result Value Ref Range   Cholesterol 177 0 - 200 mg/dL   Triglycerides 352 (H) <150 mg/dL   HDL 37 (L) >40 mg/dL   Total CHOL/HDL Ratio 4.8 RATIO   VLDL 70 (H) 0 - 40 mg/dL   LDL Cholesterol 70 0 - 99 mg/dL  CBG monitoring, ED     Status: Abnormal   Collection Time: 09/18/21  8:33 AM  Result Value Ref Range   Glucose-Capillary 172 (H) 70 - 99 mg/dL  CBG monitoring, ED     Status: Abnormal   Collection Time: 09/18/21 12:20 PM  Result Value Ref Range   Glucose-Capillary 156 (H) 70 - 99 mg/dL  CBC     Status: Abnormal   Collection Time: 09/18/21  1:18 PM  Result Value Ref Range   WBC 6.5 4.0 - 10.5 K/uL   RBC 2.83 (L) 3.87 - 5.11 MIL/uL   Hemoglobin 8.3 (L) 12.0 - 15.0 g/dL   HCT 26.7 (L) 36.0 - 46.0 %   MCV 94.3 80.0 - 100.0 fL   MCH 29.3 26.0 - 34.0 pg   MCHC 31.1 30.0 - 36.0 g/dL   RDW 15.8 (H) 11.5 - 15.5 %   Platelets 195 150 - 400 K/uL   nRBC 0.0 0.0 - 0.2 %  Hepatitis B surface antibody     Status: Abnormal   Collection Time: 09/18/21  1:18 PM  Result Value Ref Range   Hep B S Ab Reactive (A) NON REACTIVE  Renal function panel     Status: Abnormal   Collection Time: 09/18/21  1:19 PM  Result Value Ref Range   Sodium 137 135 - 145 mmol/L   Potassium 4.6 3.5 - 5.1 mmol/L   Chloride 99 98 - 111 mmol/L   CO2 23 22 - 32 mmol/L   Glucose, Bld 152 (H) 70 - 99 mg/dL   BUN 50 (H) 8 - 23 mg/dL   Creatinine, Ser 8.30 (H) 0.44 - 1.00 mg/dL   Calcium 8.1 (L) 8.9 - 10.3 mg/dL   Phosphorus 5.7 (H) 2.5 - 4.6 mg/dL   Albumin 3.0 (L) 3.5 - 5.0 g/dL   GFR, Estimated 5 (L) >60 mL/min   Anion gap 15 5 - 15  Hepatitis B surface antigen     Status: None   Collection Time: 09/18/21  1:19 PM  Result Value Ref Range   Hepatitis B Surface Ag NON REACTIVE NON REACTIVE   *Note: Due to a large number of results and/or encounters for the  requested time period, some results have not been displayed. A complete set of results can be found in Results Review.     DG Chest Portable 1 View  Result Date: 09/17/2021 CLINICAL DATA:  Shortness of breath during dialysis. EXAM: PORTABLE CHEST 1 VIEW COMPARISON:  04/21/2021 FINDINGS: The heart is within normal limits in size given the AP projection portable technique. Stable coronary artery stent. Central vascular congestion and streaky basilar scarring and atelectasis but no overt pulmonary edema or pleural effusions. IMPRESSION: Central vascular congestion and streaky basilar scarring and atelectasis. No pulmonary edema or pleural effusions. Electronically Signed   By: Marijo Sanes M.D.   On: 09/17/2021 14:47    ROS:  As  stated above in the HPI otherwise negative.  Blood pressure (!) 142/58, pulse 79, temperature 98.9 F (37.2 C), resp. rate 14, height _0  (1.727 m), weight 126.9 kg, SpO2 100 %.    PE: Gen: NAD, Alert and Oriented HEENT:  [place]/AT, EOMI Neck: Supple, no LAD Lungs: CTA Bilaterally CV: RRR without M/G/R ABD: Soft, NTND, +BS, healthy ostomy, black stool in the ostomy bag Ext: No C/C/E  Assessment/Plan: 1) Bleeding at the colostomy site. 2) Chronic anemia. 3) ESRD on dialysis.   The black stool is not melenic.  She does take iron.  The bleeding appears to be from the ostomy site.  No further bleeding noted and the patient feels that she bled from the ostomy.    Plan: 1) No GI evaluation at this time. 2) If she does have severe bleeding or melena, please call.  Further evaluation with an EGD/FFS can be performed.  [name] D 09/18/2021, 3:49 PM

## 2021-09-18 NOTE — Progress Notes (Addendum)
Family Medicine Teaching Service ?Daily Progress Note ?Intern Pager: [phone] ? ?Patient name: [name] Medical record number: [id number] ?Date of birth: 07-Nov-1955 Age: 66 y.o. Gender: female ? ?Primary Care Provider: [place] ?Consultants: GI, Nephrology ?Code Status: Full, including blood transfusions ? ?Pt Overview and Major Events to Date:  ?3/9- Admitted ? ?Assessment and Plan: ?[name] is a 66 year-old female who presented overnight with dyspnea, hypotension and bleding from stoma, noted to have anemia on labs. PMH includes ESRD on HD, T2DM, HTN, CAD s/p stents x2, hx MI, PAD, HLD, GERD, chronic combined systolic and diastolic CHF, gout, asthma, hepatitis C. ? ?Acute on chronic normocytic anemia with concern for GI bleed vs anemia of chronic disease ?Hx colostomy ?Wound care replaced colostomy bag this AM, patient has stool output and no more blood in bag. Feeling well without SOB, dizziness while sitting. VSS. No noted tachycardia, hypoxia, hypotension this AM. Hgb stable at 8.5, no change from time of admission. Concern for potential GI bleed, although anemia could be 2/2 to back-to-back HD sessions on 3/8 and 3/7 for fluid retention. Will discuss with GI today for recommendations. Observing for now. S/p 2 large bore Ivs. ?- GI consulted, awaiting page ?- NPO, sips w/ meds ?- AM CBC unless symptomatic/signs of bleeding will order stat H/H ?- Consider EPO,nephro on board ?- Would/ostomy care team following, appreciate ?- Holding home ASA '81mg'$  (for CAD, PAD, hx MI) in setting of concern for GI bleed ?- IV Protonix, will change as necessary following GI reccs ?- PT/OT eval and treat ?- Transfusion threshold <8.0 Hgb ? ?SOB, Hypotension; improving ?Hx hypertension ?BP stable this morning, improved from hypotension on arrival. Elevated systolic > 814, will resume Coreg 25 BID. ?- Monitor BP ?- Cardiac monitoring ?- Orthostatic vitals ?- VS per unit with pulse ox check ?- Holding  Hydralazine ?- Oxy '5mg'$  BID as needed ? ?Chest pain, resolved ?No CP this AM.Stable on room air. Trop flat. EKG with prolonged Qtc ?- Echocardiogram ?- Avoid Qtc prolonging agents ? ? ?Skin excoriations ?Multiple mall scarred circumscribed healed scars, some bleeding from extreme itching throughout skin all over whole body. Says she has tried "every medication" for it. Present for 2-3 months. Could be related to HD, chronic urticaria, etc. Likely needs emollient barrier. Consider Gabapentin 100 TIW after dialysis for itching. ?- Hydroxyzine TID PRN for itching ? ?ESRD on HD T/Th/Sat ?Last HD session yesterday (developed SOB, hypotension after 1 hr of session). Left AV fistula. Seen by [facility]. K+ 4.3, Ca 8.0. ?- Nephrology consulted, will follow ? ?T2DM ?Takes 160U Novolin daily. Most recent CBG 140 ?- Hold home meds in NPO status ?- Consult to diabetes coordinator ?- CBG q4 daily, before meals and at bedtime ?- Sensitive SSI ? ?Other conditions chronic and stable ?HLD- continue atorvostatin '81mg'$  ?GERD- continue allopurinol ?Gout- IV protonix ?Asthma- continue home meds ?CAD- hold ASA ? ? ?FEN/GI: NPO ?PPx: SCDs, no pharmocologic VTE ppx given high-bleeding risk ?Dispo:Pending PT recommendations  pending clinical improvement . Barriers include further inpatient workup required.  ? ?Subjective:  ?Feels well. No complaints including SOB, CP, dizziness with laying in bed but says she might feel the SOB with walking around. Reports skin has been itchy for 2-3 months and very bothersome. Reports no more blood noted per ostomy site. ? ?Objective: ?Temp:  [97.5 ?F (36.4 ?C)] 97.5 ?F (36.4 ?C) (03/08 1432) ?Pulse Rate:  [65-84] 81 (03/09 0800) ?Resp:  [9-24] 18 (03/09 0800) ?BP: (92-149)/(42-109)  149/61 (03/09 0800) ?SpO2:  [93 %-100 %] 97 % (03/09 0800) ?Weight:  [126.6 kg] 126.6 kg (03/08 1430) ?Physical Exam: ?General: Awake, alert, pleasant, laying in bed in no distress ?Cardiovascular: RRR, 3/6 systolic murmur  heard best at right upper sternal border ?Respiratory: Normal work of breathing on room air. Lungs clear in all fields with no wheezing or crackles ?Abdomen: Soft, non-tender, obese, colostomy bag in place with full dark stool without any visible blood, no signs of infection or erythema with dry dressing ?Extremities: Warm, dry, 1+ pitting edema in lower extremities, patent left AV fistula ?Skin: Multiple skin excoriations all over entire body, with some minimal bleeding. ? ?Laboratory: ?Recent Labs  ?Lab 09/17/21 ?1453 09/18/21 ?0332  ?WBC 7.1 6.0  ?HGB 8.5* 8.5*  ?HCT 27.5* 27.7*  ?PLT 193 198  ? ?Recent Labs  ?Lab 09/17/21 ?1453 09/18/21 ?0332  ?NA 136 140  ?K 4.2 4.3  ?CL 95* 101  ?CO2 28 25  ?BUN 36* 41*  ?CREATININE 6.88* 7.73*  ?CALCIUM 8.0* 8.0*  ?GLUCOSE 227* 167*  ? ? ?Imaging/Diagnostic Tests: ?DG Chest Portable 1 View ? ?Result Date: 09/17/2021 ?CLINICAL DATA:  Shortness of breath during dialysis. EXAM: PORTABLE CHEST 1 VIEW COMPARISON:  04/21/2021 FINDINGS: The heart is within normal limits in size given the AP projection portable technique. Stable coronary artery stent. Central vascular congestion and streaky basilar scarring and atelectasis but no overt pulmonary edema or pleural effusions. IMPRESSION: Central vascular congestion and streaky basilar scarring and atelectasis. No pulmonary edema or pleural effusions. Electronically Signed   By: Marijo Sanes M.D.   On: 09/17/2021 14:47   ? ? ?Orvis Brill, DO ?09/18/2021, 8:23 AM ?PGY-1, [facility] ?[place] Intern pager: [phone], text pages welcome ? ?

## 2021-09-18 NOTE — Progress Notes (Addendum)
Inpatient Diabetes Program Recommendations ? ?AACE/ADA: New Consensus Statement on Inpatient Glycemic Control (2015) ? ?Target Ranges:  Prepandial:   less than 140 mg/dL ?     Peak postprandial:   less than 180 mg/dL (1-2 hours) ?     Critically ill patients:  140 - 180 mg/dL  ? ?Lab Results  ?Component Value Date  ? GLUCAP 156 (H) 09/18/2021  ? HGBA1C 10.7 (A) 07/23/2021  ? ? ?Review of Glycemic Control ? ?Diabetes history: DM2 ?Outpatient Diabetes medications: Novolin N 140 units q am ?Current orders for Inpatient glycemic control: Novolog correction 0-9 units tid ? ?Inpatient Diabetes Program Recommendations:   ?Please consider if CBGs >180: ?-Levemir 5 units bid ?-While NPO, change Novolog correction to 0-6 units q 4 hrs. ? ?Patient sees Dr. Loanne Drilling for endocrinology with last office visit 09/05/21. ?On this visit, Levemir insulin changed to Novolin N. ?A1c 10.7 on 07/23/21. ? ?Will follow during hospitalization. ? ?Thank you, ?Nani Gasser Mayola Mcbain, RN, MSN, CDE  ?Diabetes Coordinator ?Inpatient Glycemic Control Team ?Team Pager [phone] (8am-5pm) ?09/18/2021 1:32 PM ? ? ?

## 2021-09-18 NOTE — Procedures (Signed)
? ?  I was present at this dialysis session, have reviewed the session itself and made  appropriate changes ?Kelly Splinter MD ?[employer] ?pager [phone]   ?09/18/2021, 4:44 PM ? ? ?

## 2021-09-18 NOTE — Consult Note (Addendum)
[place] Nurse ostomy consult note ?Patient receiving care in [facility] ED38 ?Receiving ostomy care from [place]. Has been scheduled for reversal twice but cancelled each time due to elevated blood sugars, uncontrolled diabetes. Ostomy creation on 07/29/21 d/t Necrotizing fasciitis of bilateral mons pubis, labia and gluteal/perineal regions in which she has had multiple I&D procedures ?Stoma type/location: LLQ colostomy with parastomal hernia ?Stomal assessment/size: 2" slightly oval, pink just above skin level  ?Peristomal assessment: intact, no irritation noted as mentioned in consult.  ?Treatment options for stomal/peristomal skin: no sting skin barrier wipes, barrier ring ?Output; mushy, dark green/black. Pouch was full.  ?Ostomy pouching: 1pc. Kellie Simmering # [address] Barrier ring Kellie Simmering # [phone]) ?Education provided: discussed foods causing gas and diarrhea and resources on ostomy.org website.  ? ?Thank you for the consult. [place] nurse will not follow at this time.   ?Please re-consult the [place] team if needed. ? ?Cathlean Marseilles. Tamala Julian, MSN, RN, CMSRN, AGCNS, WTA ?Wound Treatment Associate ?Pager [phone]   ? ?

## 2021-09-19 ENCOUNTER — Inpatient Hospital Stay (HOSPITAL_COMMUNITY): Payer: Medicare Other

## 2021-09-19 ENCOUNTER — Ambulatory Visit: Payer: Medicare HMO | Admitting: Endocrinology

## 2021-09-19 DIAGNOSIS — I5042 Chronic combined systolic (congestive) and diastolic (congestive) heart failure: Secondary | ICD-10-CM

## 2021-09-19 DIAGNOSIS — K922 Gastrointestinal hemorrhage, unspecified: Secondary | ICD-10-CM | POA: Diagnosis not present

## 2021-09-19 DIAGNOSIS — R0602 Shortness of breath: Secondary | ICD-10-CM | POA: Diagnosis not present

## 2021-09-19 DIAGNOSIS — N186 End stage renal disease: Secondary | ICD-10-CM | POA: Diagnosis not present

## 2021-09-19 LAB — GLUCOSE, CAPILLARY
Glucose-Capillary: 132 mg/dL — ABNORMAL HIGH (ref 70–99)
Glucose-Capillary: 217 mg/dL — ABNORMAL HIGH (ref 70–99)
Glucose-Capillary: 337 mg/dL — ABNORMAL HIGH (ref 70–99)

## 2021-09-19 LAB — CBC
HCT: 30.4 % — ABNORMAL LOW (ref 36.0–46.0)
Hemoglobin: 9.6 g/dL — ABNORMAL LOW (ref 12.0–15.0)
MCH: 29.3 pg (ref 26.0–34.0)
MCHC: 31.6 g/dL (ref 30.0–36.0)
MCV: 92.7 fL (ref 80.0–100.0)
Platelets: 198 10*3/uL (ref 150–400)
RBC: 3.28 MIL/uL — ABNORMAL LOW (ref 3.87–5.11)
RDW: 15.8 % — ABNORMAL HIGH (ref 11.5–15.5)
WBC: 8 10*3/uL (ref 4.0–10.5)
nRBC: 0.3 % — ABNORMAL HIGH (ref 0.0–0.2)

## 2021-09-19 LAB — ECHOCARDIOGRAM COMPLETE
AR max vel: 1.16 cm2
AV Area VTI: 1.34 cm2
AV Area mean vel: 1.09 cm2
AV Mean grad: 22 mmHg
AV Peak grad: 33.6 mmHg
Ao pk vel: 2.9 m/s
Area-P 1/2: 2.5 cm2
Height: 68 in
MV VTI: 2.08 cm2
S' Lateral: 3.5 cm
Weight: 4476.22 oz

## 2021-09-19 LAB — HEPATITIS B SURFACE ANTIBODY, QUANTITATIVE: Hep B S AB Quant (Post): 61.2 m[IU]/mL (ref >9.9)

## 2021-09-19 MED ORDER — CAMPHOR-MENTHOL 0.5-0.5 % EX LOTN
TOPICAL_LOTION | CUTANEOUS | Status: DC | PRN
  Filled 2021-09-19: qty 222

## 2021-09-19 MED ORDER — PREGABALIN 75 MG PO CAPS
150.0000 mg | ORAL_CAPSULE | Freq: Two times a day (BID) | ORAL | Status: DC
Start: 2021-09-19 — End: 2021-09-19

## 2021-09-19 MED ORDER — DIPHENHYDRAMINE HCL 25 MG PO CAPS
25.0000 mg | ORAL_CAPSULE | Freq: Four times a day (QID) | ORAL | Status: DC | PRN
  Administered 2021-09-19 (×2): 25 mg via ORAL
  Filled 2021-09-19 (×2): qty 1

## 2021-09-19 MED ORDER — OXYCODONE HCL 5 MG PO TABS: 5.0000 mg | ORAL_TABLET | Freq: Two times a day (BID) | ORAL | 0 refills | Status: DC | PRN

## 2021-09-19 MED ORDER — CARVEDILOL 12.5 MG PO TABS: 12.5000 mg | ORAL_TABLET | Freq: Two times a day (BID) | ORAL | 0 refills | Status: DC

## 2021-09-19 MED ORDER — ACETAMINOPHEN 325 MG PO TABS
650.0000 mg | ORAL_TABLET | Freq: Four times a day (QID) | ORAL | Status: DC | PRN
  Administered 2021-09-19: 650 mg via ORAL
  Filled 2021-09-19: qty 2

## 2021-09-19 MED ORDER — INSULIN NPH (HUMAN) (ISOPHANE) 100 UNIT/ML ~~LOC~~ SUSP
60.0000 [IU] | Freq: Once | SUBCUTANEOUS | Status: AC
  Administered 2021-09-19: 60 [IU] via SUBCUTANEOUS
  Filled 2021-09-19: qty 10

## 2021-09-19 MED ORDER — PREGABALIN 25 MG PO CAPS
25.0000 mg | ORAL_CAPSULE | Freq: Two times a day (BID) | ORAL | Status: DC
  Administered 2021-09-19 (×2): 25 mg via ORAL
  Filled 2021-09-19 (×2): qty 1

## 2021-09-19 NOTE — Evaluation (Signed)
Occupational Therapy Evaluation Patient Details Name: [name] MRN: [id number] DOB: Oct 23, 1955 Today's Date: 09/19/2021   History of Present Illness 66 y.o. female presenting 09/17/21 from home to [facility] ED complaining of shortness of breath and bleeding from stoma. EGD found 2 non-bleeding cratered duodenal ulcers. CXR notable for central vascular congestion and streaky basilar scarring and atelectasis. PMH end-stage renal disease on HD TTS, type 2 diabetes mellitus, chronic diastolic heart failure, hyperlipidemia, hypertension   Clinical Impression   Patient is currently requiring assistance with ADLs including moderate assist with Lower body ADLs, up to min guard assist with Upper body ADLs,  as well as supervision to Min guard assist with functional transfers to toilet.  Current level of function is below patient's typical baseline.  During this evaluation, patient was limited by generalized weakness, impaired activity tolerance, and pain/numbness to hands from pt reported neuropathy, all of which has the potential to impact patient's safety and independence during functional mobility, as well as performance for ADLs.  Patient lives with her family, who are able to provide close to 24/7 supervision and assistance.  Patient demonstrates good rehab potential, and should benefit from continued skilled occupational therapy services while in acute care to maximize safety, independence and quality of life at home.  Continued occupational therapy services in the home setting is recommended.  ?     Recommendations for follow up therapy are one component of a multi-disciplinary discharge planning process, led by the attending physician.  Recommendations may be updated based on patient status, additional functional criteria and insurance authorization.   Follow Up Recommendations  Home health OT    Assistance Recommended at Discharge Intermittent Supervision/Assistance  Patient can return home with the  following A little help with walking and/or transfers;A little help with bathing/dressing/bathroom    Functional Status Assessment  Patient has had a recent decline in their functional status and demonstrates the ability to make significant improvements in function in a reasonable and predictable amount of time.  Equipment Recommendations  Other (comment);Tub/shower seat (Sock aid, long handled bath brush/sponge, long shoe horn, shower chair)    Recommendations for Other Services       Precautions / Restrictions Precautions Precautions: Fall Precaution Comments: watch O2 Restrictions Weight Bearing Restrictions: No      Mobility Bed Mobility               General bed mobility comments: Pt up in recliner    Transfers Overall transfer level:  (see ADL)                        Balance Overall balance assessment: History of Falls, Needs assistance Sitting-balance support: No upper extremity supported, Feet supported Sitting balance-Leahy Scale: Good       Standing balance-Leahy Scale: Fair Standing balance comment: Fair for standing and bathing without UE support. Close supervision for safety.                           ADL either performed or assessed with clinical judgement   ADL Overall ADL's : Needs assistance/impaired Eating/Feeding: Modified independent Eating/Feeding Details (indicate cue type and reason): May need setup assist with containers due to hand neuropathy. Grooming: Set up;Sitting;Standing;Supervision/safety Grooming Details (indicate cue type and reason): Pt performed some grooming while seated and some while standing.  Setup for open contianers due to hand neuropathy. Supervision with standing tasks for safety.   Upper Body Bathing Details (  indicate cue type and reason): Assisted for back only which is pt's baseline. Pt standing and rubbing between buttocks for extended time and found washclothes coming out bloody. Noted bleeding  wound between buttocks where pt reports she has been scratching.  RN notified and dressings appled. Pt educated to use washcloth to scratch NOT fingernails but pt not complying. Lower Body Bathing: Moderate assistance Lower Body Bathing Details (indicate cue type and reason): Pt able to bath upper legs and peri areas with supevision but required Mod As to complete due to inability to bath feet.  Lotion also applied to feet per pt request. Upper Body Dressing : Minimal assistance;Cueing for compensatory techniques;Sitting Upper Body Dressing Details (indicate cue type and reason): Min As for anterior gown. Lower Body Dressing: Maximal assistance;Sitting/lateral leans Lower Body Dressing Details (indicate cue type and reason): Pt doffed socks with Min As (too fatigued to complete) and required Total Assist to don socks. Toilet Transfer: Stand-pivot;Cueing for safety;Minimal Surveyor, quantity Details (indicate cue type and reason): Simulated with recliner as pt declined need to void. Pt stood from recliner with supervision, but required Min As to safely descend due to poor eccentric control. Toileting- Clothing Manipulation and Hygiene: Minimal assistance;Sitting/lateral lean;Sit to/from stand       Functional mobility during ADLs: Supervision/safety;Minimal assistance       Vision Baseline Vision/History: 1 Wears glasses Ability to See in Adequate Light: 0 Adequate Vision Assessment?: No apparent visual deficits     Perception Perception Perception: Within Functional Limits   Praxis Praxis Praxis: Intact    Pertinent Vitals/Pain Pain Assessment Pain Assessment: 0-10 Pain Score: 10-Worst pain ever Pain Location: Neuropathy pain to hands and feet. Pain Descriptors / Indicators: Burning, Tender, Tingling, Numbness, Guarding Pain Intervention(s): Limited activity within patient's tolerance, Monitored during session, Other (comment) (Educated pt on desentization  techniques on hands starting with soft cloth and working on tolerating "a little uncomfortable, but avoiding pain. Pt agreed to begin)     Hand Dominance Right   Extremity/Trunk Assessment Upper Extremity Assessment Upper Extremity Assessment: Generalized weakness;RUE deficits/detail;LUE deficits/detail RUE Deficits / Details: Shoulder/Elbow: WFL RUE: Unable to fully assess due to pain RUE Sensation: history of peripheral neuropathy RUE Coordination: decreased fine motor LUE Deficits / Details: Shoulder/elbow: WFL LUE: Unable to fully assess due to pain LUE Sensation: history of peripheral neuropathy LUE Coordination: decreased fine motor   Lower Extremity Assessment Lower Extremity Assessment: Generalized weakness   Cervical / Trunk Assessment Cervical / Trunk Assessment: Kyphotic   Communication Communication Communication: No difficulties   Cognition Arousal/Alertness: Awake/alert Behavior During Therapy: WFL for tasks assessed/performed Overall Cognitive Status: Within Functional Limits for tasks assessed                                 General Comments: Impulsive when moving from stand to sit--uncontrolled hard descent.     General Comments       Exercises     Shoulder Instructions      Home Living Family/patient expects to be discharged to:: Private residence Living Arrangements: Children Available Help at Discharge: Family;Available 24 hours/day Type of Home: House Home Access: Stairs to enter [employer] of Steps: 1 Entrance Stairs-Rails: Can reach both Home Layout: One level     Bathroom Shower/Tub: Tub/shower unit;Walk-in shower   Bathroom Toilet: Standard     Home Equipment: Cane - single Librarian, academic (2 wheels);Adaptive equipment Adaptive Equipment: Reacher Additional Comments: Knows  about sock aid and interested but does not have. Educated on where to find online and in community.      Prior  Functioning/Environment Prior Level of Function : Needs assist;History of Falls (last six months)       Physical Assist : ADLs (physical)   ADLs (physical): Bathing Mobility Comments: uses cane for primary mobility. reports 2 falls in past 6 months ADLs Comments: daughter assists with washing back in the shower        OT Problem List: Decreased strength;Decreased coordination;Pain;Decreased activity tolerance;Impaired balance (sitting and/or standing);Decreased safety awareness;Impaired UE functional use;Impaired sensation      OT Treatment/Interventions: Self-care/ADL training;Therapeutic exercise;Therapeutic activities;Energy conservation;Visual/perceptual remediation/compensation;Neuromuscular education;DME and/or AE instruction;Patient/family education;Balance training;Manual therapy    OT Goals(Current goals can be found in the care plan section) Acute Rehab OT Goals Patient Stated Goal: To have energy to stand and do the dishes. OT Goal Formulation: With patient Time For Goal Achievement: 10/03/21 Potential to Achieve Goals: Good ADL Goals Pt Will Perform Grooming: standing;with modified independence (3/3) Pt Will Perform Lower Body Bathing: with supervision;with adaptive equipment;sitting/lateral leans;sit to/from stand Pt Will Perform Lower Body Dressing: with modified independence;with adaptive equipment (Using energy conservation techniques) Pt Will Transfer to Toilet: with modified independence;ambulating Pt Will Perform Toileting - Clothing Manipulation and hygiene: with modified independence Additional ADL Goal #1: Pt will engage in at least 8 min standing functional activities without loss of balance, in order to demonstrate improved activity tolerance and balance needed to perform ADLs safely at home.  OT Frequency: Min 2X/week    Co-evaluation              AM-PAC OT "6 Clicks" Daily Activity     Outcome Measure Help from another person eating meals?: A  Little Help from another person taking care of personal grooming?: A Little Help from another person toileting, which includes using toliet, bedpan, or urinal?: A Little Help from another person bathing (including washing, rinsing, drying)?: A Lot Help from another person to put on and taking off regular upper body clothing?: A Little Help from another person to put on and taking off regular lower body clothing?: A Lot 6 Click Score: 16   End of Session Nurse Communication: Other (comment) (Nursing in and out throughout OT session)  Activity Tolerance: Patient tolerated treatment well Patient left: in chair;with call bell/phone within reach;with nursing/sitter in room  OT Visit Diagnosis: Repeated falls (R29.6);History of falling (Z91.81);Pain;Muscle weakness (generalized) (M62.81) Pain - Right/Left:  (Bil) Pain - part of body: Hand                Time: 1016-1101 OT Time Calculation (min): 45 min Charges:  OT General Charges $OT Visit: 1 Visit OT Treatments $Self Care/Home Management : 23-37 mins $Therapeutic Activity: 8-22 mins  Victorino Dike, OT Acute Rehab Services Office: [phone] 09/19/2021  Theodoro Clock 09/19/2021, 12:54 PM

## 2021-09-19 NOTE — Discharge Instructions (Addendum)
Dear [name],  ? ?Thank you for letting us participate in your care! In this section, you will find a brief hospital admission summary of why you were admitted to the hospital, what happened during your admission, your diagnosis/diagnoses, and recommended follow up.  ?You were admitted because you were experiencing concern for blood in ostomy bag.  ?Your testing revealed there was no concern for GI bleed.  ?You were treated with dialysis with ESA that works to increase your blood counts.  ?You were also seen by nephrology (kidney doctors). ? ? ?POST-HOSPITAL & CARE INSTRUCTIONS ?Please see your endocrinologist for management of your diabetes, as your appointment with them today was missed. ?Please let PCP/Specialists know of any changes in medications that were made.  ?Please see medications section of this packet for any medication changes.  ? ?DOCTOR'S APPOINTMENTS & FOLLOW UP ? ?Hold Hydralazine until follow up with Primary Care Provider ? ?Coreg is the only medicine you will take until you see your Primary Provider ? ? ? ?Thank you for choosing [facility]! Take care and be well! ? ?Family Medicine Teaching Service Inpatient Team ?[place]  ?[facility]  ?[address] [place], [place] [address] ?[phone]  ?

## 2021-09-19 NOTE — Progress Notes (Signed)
FPTS Interim Night Progress Note ? ?S:Patient sleeping comfortably.  Rounded with primary night RN. Reports patient complaining of itching and neuropathic pain earlier.  Since receiving Lyrica and Benadryl has seemed to help.  No concerns voiced.  No orders required.   ? ?O: ?Today's Vitals  ? 09/18/21 2107 09/18/21 2146 09/18/21 2204 09/19/21 0100  ?BP:  137/90    ?Pulse:  83    ?Resp:      ?Temp:      ?TempSrc:      ?SpO2:      ?Weight:      ?Height:      ?PainSc: 10-Worst pain ever  6  10-Worst pain ever  ? ? ? ? ?A/P: ?Continue current management ? ?Carollee Leitz MD ?PGY-3, [facility] ?Service pager [phone]   ?

## 2021-09-19 NOTE — TOC Transition Note (Signed)
Transition of Care (TOC) - CM/SW Discharge Note ? ? ?Patient Details  ?Name: [name] ?MRN: [id number] ?Date of Birth: 11/24/1955 ? ?Transition of Care (TOC) CM/SW Contact:  ?Tom-Johnson, Renea Ee, RN ?Phone Number: ?09/19/2021, 5:09 PM ? ? ?Clinical Narrative:    ? ?Patient is scheduled for discharge today. Admitted for GI Bleed. From home with daughter. Has three adult children. Independent with care and drive self prior to admission. Currently on disability. PT/OT recommended Home health. Patient was previously active with [facility] and requests to use their disciplines again. Referral called in to [facility] and acceptance voiced. Information on AVS. Shower seat ordered. Medicare does not pay for shower seats. Patient declined paying out of pocket. PCP is at [facility] and uses [facility] on [address] [facility] [address] [facility]. Daughter to transport at discharge. No further TOC needs noted.   ? ?Final next level of care: [place] ?Barriers to Discharge: Barriers Resolved ? ? ?Patient Goals and CMS Choice ?Patient states their goals for this hospitalization and ongoing recovery are:: To return home ?CMS Medicare.gov Compare Post Acute Care list provided to:: Patient ?Choice offered to / list presented to : Patient ? ?Discharge Placement ?  ?           ?  ?Patient to be transferred to facility by: Daughter ?  ?  ? ?Discharge Plan and Services ?  ?  ?           ?DME Arranged: N/A (Has to pay out of pocket for shower seat, declined.) ?DME Agency: NA ?  ?  ?  ?HH Arranged: PT, OT ?[place] Agency: [facility] ?Date HH Agency Contacted: 09/19/21 ?Time [place]: 1500 ?Representative spoke with at [place]: Delsa Sale ? ?Social Determinants of Health (SDOH) Interventions ?  ? ? ?Readmission Risk Interventions ?Readmission Risk Prevention Plan 02/01/2020 12/30/2018  ?Transportation Screening Complete Complete  ?[place] or Home Care Consult - Complete  ?Palliative Care Screening - Not Applicable  ?Medication  Review Press photographer) Complete Complete  ?PCP or Specialist appointment within 3-5 days of discharge Complete -  ?[place] or Home Care Consult Complete -  ?SW Recovery Care/Counseling Consult Complete -  ?Palliative Care Screening Not Applicable -  ?[place] Not Applicable -  ?Some recent data might be hidden  ? ? ? ? ? ?

## 2021-09-19 NOTE — Care Management Important Message (Signed)
Important Message ? ?Patient Details  ?Name: [name] ?MRN: [id number] ?Date of Birth: 02-10-1956 ? ? ?Medicare Important Message Given:    ? ? ? ? ?Kinslei Labine ?09/19/2021, 1:46 PM ?

## 2021-09-19 NOTE — Progress Notes (Signed)
Discharge instructions (including medications) discussed with and copy provided to patient/caregiver 

## 2021-09-19 NOTE — Discharge Summary (Addendum)
[facility] Discharge Summary  Patient name: [name] Medical record number: [id number] Date of birth: 1955/11/26 Age: 66 y.o. Gender: female Date of Admission: 09/17/2021  Date of Discharge: 09/19/2021 Admitting Physician: Wells Guiles, DO  Primary Care Provider: [facility] Consultants: Nephrology, Gastroenterology  Indication for Hospitalization: Shortness of breath, chest pain, hypotension  Discharge Diagnoses/Problem List:  Principal Problem:   GI bleed Active Problems:   ESRD (end stage renal disease) ([place])   Disposition: Home with [facility]  Discharge Condition: Stable  Discharge Exam:  General: Eating breakfast, in no distress Cardiovascular: RRR Respiratory: Clear in all fields Abdomen: Obese, soft, ostomy bag in place Extremities: Warm, dry Skin: Small, circular hypopigmented over extremities with some pinpoint bleeding from itching  Brief Hospital Course:  [name] is a 66 y.o.female with a history of ESRD on HD, T2DM, HTN, CAD s/p stent x2, hx of MI (2017), PAD, HLD, GERD, chronic combined systolic and diastolic CHF, gout, asthma, hep C who was admitted to the [facility] at [facility] for anemia w/ blood in stoma. Her hospital course is detailed below:  Concern for GI bleed with acute on chronic normocytic anemia, s/p colostomy for necrotizing fasciitis Patient presented with hemoglobin 8.5 and notice of minimal bleeding into colostomy bag with dark stools. Stoma bleeding ceased and she had no obvious sign of bleeding. GI was consulted and noted that dark stool likely secondary to Vephoro binders which turns stool black.  It is also suspected that acute drop in hemoglobin due to increased amount of dialysis as patient was having increased fluid retention.    Shortness of breath   Hypotension   Chest Pain Patient presented to ED after experiencing hypotension and shortness of breath at hemodialysis session  that morning which was notably a second session in a 2-day time.  Due to excess fluid retention.  Troponins negative x2 and patient was stable on room air.  CXR notable for central vascular congestion and streaky basilar scarring atelectasis without pulmonary edema or pleural effusions.  Echocardiogram with LVEF 60-65%, no wall motion abnormalities, moderate aortic stenosis, worsened mitral valve calcium from 2020.These symptoms resolved while in the ED and did not require medical intervention while admitted. No ACS concern. She had a few softer blood pressures so Coreg home dose was halved to 12.'5mg'$ .  Ambulatory walk test revealed desat to 88% that resovled to 90s with pursed lip breathing. No home oxygen required.  T2DM Patient was NPO in anticipation of potential GI procedure. With renal diet added back on, patient received SSI and had 4units total over 24 hours. CBG well controlled in 100s and 200s. Home 160u NPH resumed at discharge with recommendation for endocrinology follow up.  ESRD on HD Nephrology consulted and patient received HD on 3/9, tolerated well although had transient drop in BP to 70s/40s. Received ESA.   Other chronic conditions were medically managed with home medications and formulary alternatives as necessary (Metabolic bone disease, CAD h/o stents)  PCP Follow-up Recommendations: Ensure adequate endocrinology follow up, compliance with insulin regimen Frequent fall history- consider discontinuing oxycodone/ other beers list medications harmful in the elderly   Significant Procedures: None  Significant Labs and Imaging:  Recent Labs  Lab 09/18/21 0332 09/18/21 1318 09/19/21 1103  WBC 6.0 6.5 8.0  HGB 8.5* 8.3* 9.6*  HCT 27.7* 26.7* 30.4*  PLT 198 195 198   Recent Labs  Lab 09/17/21 1453 09/18/21 0332 09/18/21 1319  NA 136 140  137  K 4.2 4.3 4.6  CL 95* 101 99  CO2 '28 25 23  '$ GLUCOSE 227* 167* 152*  BUN 36* 41* 50*  CREATININE 6.88* 7.73* 8.30*  CALCIUM  8.0* 8.0* 8.1*  PHOS  --   --  5.7*  ALBUMIN  --   --  3.0*      Results/Tests Pending at Time of Discharge: None  Discharge Medications:  Allergies as of 09/19/2021       Reactions   Morphine Itching   Tolerated hydromorphone on 07/2020   Shellfish Allergy Anaphylaxis, Swelling   Diazepam Other (See Comments)   "felt like out of body experience"        Medication List     STOP taking these medications    buPROPion 150 MG 12 hr tablet Commonly known as: Wellbutrin SR   colchicine 0.6 MG tablet   cyclobenzaprine 10 MG tablet Commonly known as: FLEXERIL   docusate sodium 100 MG capsule Commonly known as: COLACE   doxepin 10 MG capsule Commonly known as: SINEQUAN   hydrALAZINE 100 MG tablet Commonly known as: APRESOLINE   lidocaine-prilocaine cream Commonly known as: EMLA   pantoprazole 40 MG tablet Commonly known as: PROTONIX   torsemide 20 MG tablet Commonly known as: DEMADEX   triamcinolone cream 0.1 % Commonly known as: KENALOG       TAKE these medications    albuterol 108 (90 Base) MCG/ACT inhaler Commonly known as: VENTOLIN HFA Inhale 2 puffs into the lungs every 4 (four) hours as needed for wheezing or shortness of breath.   allopurinol 100 MG tablet Commonly known as: ZYLOPRIM TAKE 1 TABLET BY MOUTH ONCE DAILY.   aspirin 81 MG EC tablet Take 1 tablet (81 mg total) by mouth daily.   atorvastatin 80 MG tablet Commonly known as: LIPITOR Take 1 tablet (80 mg total) by mouth every evening.   Auryxia 1 GM 210 MG(Fe) tablet Generic drug: ferric citrate Take 2 tablets (420 mg total) by mouth 3 (three) times daily with meals.   carvedilol 12.5 MG tablet Commonly known as: COREG Take 1 tablet (12.5 mg total) by mouth 2 (two) times daily with a meal. What changed:  medication strength how much to take   diclofenac Sodium 1 % Gel Commonly known as: Voltaren Apply 2 g topically 4 (four) times daily.   dicyclomine 20 MG tablet Commonly  known as: BENTYL Take 1 tablet (20 mg total) by mouth in the morning and at bedtime.   feeding supplement (NEPRO CARB STEADY) Liqd Take 237 mLs by mouth daily.   fluticasone 50 MCG/ACT nasal spray Commonly known as: FLONASE Place 2 sprays into both nostrils daily as needed for allergies or rhinitis.   fluticasone furoate-vilanterol 200-25 MCG/INH Aepb Commonly known as: BREO ELLIPTA Inhale 1 puff into the lungs daily. What changed:  when to take this reasons to take this   FreeStyle Libre 14 Day Reader Kerrin Mo To check blood sugar ACHS   FreeStyle Libre 14 Day Sensor Misc To check blood sugar ACHS; change every 14 days   hydrOXYzine 25 MG tablet Commonly known as: ATARAX Take 1 tablet (25 mg total) by mouth 4 (four) times daily. What changed: when to take this   loratadine 10 MG tablet Commonly known as: CLARITIN TAKE 1 TABLET BY MOUTH ONCE DAILY. What changed:  when to take this reasons to take this   montelukast 10 MG tablet Commonly known as: SINGULAIR TAKE ONE TABLET BY MOUTH AT BEDTIME.   nitroGLYCERIN  0.4 MG SL tablet Commonly known as: NITROSTAT Place 1 tablet (0.4 mg total) under the tongue every 5 (five) minutes x 3 doses as needed for chest pain.   NovoLIN N FlexPen 100 UNIT/ML Kiwkpen Generic drug: Insulin NPH (Human) (Isophane) Inject 140 Units into the skin every morning. And 32G pen needles 1/day What changed: additional instructions   nystatin cream Commonly known as: MYCOSTATIN Apply 1 application topically 2 (two) times daily. What changed:  when to take this reasons to take this   omeprazole 20 MG capsule Commonly known as: PRILOSEC Take 20 mg by mouth daily.   oxyCODONE 5 MG immediate release tablet Commonly known as: Roxicodone Take 1 tablet (5 mg total) by mouth every 12 (twelve) hours as needed for severe pain.   oxyCODONE-acetaminophen 7.5-325 MG tablet Commonly known as: PERCOCET Take 1 tablet by mouth 3 (three) times daily. What  changed: Another medication with the same name was removed. Continue taking this medication, and follow the directions you see here.   pregabalin 150 MG capsule Commonly known as: LYRICA Take 1 capsule (150 mg total) by mouth 2 (two) times daily.   Rena-Vite Rx 1 MG Tabs Take 1 mg by mouth daily.   Senna-Plus 8.6-50 MG tablet Generic drug: senna-docusate TAKE ONE TABLET BY MOUTH AT BEDTIME.   sucralfate 1 g tablet Commonly known as: CARAFATE Take 1 tablet (1 g total) by mouth 2 (two) times daily for 26 days. What changed: when to take this   UltiCare Short Pen Needles 31G X 8 MM Misc Generic drug: Insulin Pen Needle USE THREE TIMES DAILY WITH INSULIN   valACYclovir 500 MG tablet Commonly known as: VALTREX TAKE (1) TABLET BY MOUTH EVERY OTHER DAY. What changed: See the new instructions.   Velphoro 500 MG chewable tablet Generic drug: sucroferric oxyhydroxide Chew 1,500 mg by mouth 3 (three) times daily.   Vitamin D (Ergocalciferol) 1.25 MG (50000 UNIT) Caps capsule Commonly known as: DRISDOL TAKE 1 CAPSULE BY MOUTH ONCE A MONTH What changed: See the new instructions.        Discharge Instructions: Please refer to Patient Instructions section of EMR for full details.  Patient was counseled important signs and symptoms that should prompt return to medical care, changes in medications, dietary instructions, activity restrictions, and follow up appointments.   Follow-Up Appointments:  Follow-up [place]. Schedule an appointment as soon as possible for a visit in 1 week(s).   Why: as soon as possible Contact information: [place] [address] [phone]         Sherren Mocha, MD .   Specialty: Cardiology Contact information: [address] [place] [address] [phone]

## 2021-09-19 NOTE — Progress Notes (Signed)
Echocardiogram ?2D Echocardiogram has been performed. ? ?Oneal Deputy Bastion Bolger RDCS ?09/19/2021, 9:13 AM ?

## 2021-09-19 NOTE — Progress Notes (Signed)
 Family Medicine Teaching Service Daily Progress Note Intern Pager: [phone]  Patient name: [name] Medical record number: [id number] Date of birth: April 28, 1956 Age: 66 y.o. Gender: female  Primary Care Provider: [facility] Medical Consultants: Gastroenterology, Nephrology Code Status: Full  Pt Overview and Major Events to Date:  3/9- Admitted, GI consulted and s/o  Assessment and Plan: [name] is a 66 year-old female admitted from HD with dyspnea, hypotension and bleeding from stoma with concern for GI bleed, now ruled-out, with resolution of other symptoms and stable for discharge pending continued improvement into this afternoon.  AoC normocytic anemia, likely of CKD S/p colostomy for necrotizing fasciitis Feeling well, eating breakfast.Hgb 8.3. Hadnt had ESA since 09/04/21, given yesterday in HD. GI consulted and signed off- black stools likely thought to be secondary to Vephoro binders. No concern for GI bleed. - Regular diet - Monitor for sx's/signs of bleeding - GI signed off - If remains inpatient, am CBC  Chest pain/dyspnea, resolved NSR. Asymptomatic this morning. No O2 requirement. Ambulatory pulse ox test without need for O2, desat to 88% but resolved with pursed lip breathing.  Echocardiogram with LVEF 60-65%, no wall motion abnormalities, moderate aortic stenosis, worsened mitral valve calcium  from 2020. - Monitor VS  Hypertension BP 110-150, dec Coreg  to 12.5mg  BID.  - Continue coreg  12.5 mg BID - Monitor VS  Skin excoriations Resolved w/ benadryl . Likely from ongoing chronic urticaria. Continued itching this morning, requesting medication - Start Sarna lotion - Benadryl  25mg  PO q6h itching - Hydroxyzine  TID PRN itching  ESRD HD yesterday.BP dropped to 73/43 transiently then returned to 120/70. ESA given. - Nephrology following, appreciate care  T2DM CBGs well-controlled while inpatient in 100-200s. No CBG >300 or <90. Took NPH yesterday  morning, likely providing coverage yesterday and into today. Since starting diet this morning, added 60u Semglee . Patient will be instructed to take 60u NPH this evening at home, and resume normal regimen at discharge with close endocrinology f/u. Has only required 4u total novolog  in past 24 hours. Per endocrinologist note Taking complex situation in stages, started with NPH 140 units each morning. Supposed to have endo appt today, but will have to reschedule this  - S/p 60u NPH this AM - CBG 4 times daily, before meals and at bedtime  FEN/GI: Renal w/ fluid restriction PPx: SCDs Dispo:Home today. Barriers include monitoring CBG prior to d/c.  Subjective:  Feeling well other than itchy skin, ongoing for years. Eating breakfast tray. Ready to go home.  Objective: Temp:  [98.1 F (36.7 C)-98.9 F (37.2 C)] 98.3 F (36.8 C) (03/10 0456) Pulse Rate:  [48-87] 80 (03/10 0456) Resp:  [11-24] 18 (03/10 0456) BP: (100-162)/(32-90) 110/38 (03/10 0456) SpO2:  [95 %-100 %] 95 % (03/10 0456) Weight:  [126.9 kg] 126.9 kg (03/09 1249) Physical Exam: General: Eating breakfast, in no distress Cardiovascular: RRR Respiratory: Clear in all fields Abdomen: Obese, soft, ostomy bag in place Extremities: Warm, dry Skin: Small, circular hypopigmented over extremities with some pinpoint bleeding from itching  Laboratory: Recent Labs  Lab 09/17/21 1453 09/18/21 0332 09/18/21 1318  WBC 7.1 6.0 6.5  HGB 8.5* 8.5* 8.3*  HCT 27.5* 27.7* 26.7*  PLT 193 198 195   Recent Labs  Lab 09/17/21 1453 09/18/21 0332 09/18/21 1319  NA 136 140 137  K 4.2 4.3 4.6  CL 95* 101 99  CO2 28 25 23   BUN 36* 41* 50*  CREATININE 6.88* 7.73* 8.30*  CALCIUM  8.0* 8.0* 8.1*  GLUCOSE  227* 167* 152*     Imaging/Diagnostic Tests: ECHOCARDIOGRAM COMPLETE  Result Date: 09/19/2021    ECHOCARDIOGRAM REPORT   Patient Name:   [name] Date of Exam: 09/19/2021 Medical Rec #:  [id number]     Height:       68.0 in  Accession #:    [id number]    Weight:       279.8 lb Date of Birth:  [birth year]     BSA:          2.357 m Patient Age:    65 years      BP:           110/38 mmHg Patient Gender: F             HR:           78 bpm. Exam Location:  Inpatient Procedure: 2D Echo, Color Doppler and Cardiac Doppler Indications:    I50.9* Heart failure (unspecified)  History:        Patient has prior history of Echocardiogram examinations, most                 recent 07/29/2018. CHF, CAD, COPD; Risk Factors:Hypertension,                 Diabetes, Dyslipidemia, Sleep Apnea and ESRD.  Sonographer:    Damien Senior RDCS Referring Phys: [id number] LYNWOOD MOULDER IMPRESSIONS  1. Left ventricular ejection fraction, by estimation, is 60 to 65%. The left ventricle has normal function. The left ventricle has no regional wall motion abnormalities. Left ventricular diastolic parameters are indeterminate. Elevated left atrial pressure.  2. Right ventricular systolic function is normal. The right ventricular size is normal. Tricuspid regurgitation signal is inadequate for assessing PA pressure.  3. No evidence of mitral valve regurgitation. Mild mitral stenosis. The mean mitral valve gradient is 6.0 mmHg with average heart rate of 81 bpm. Moderate mitral annular calcification. MVA by continuity equation 2 cm2. There is an abnormal calcification  on the mitral valve that extended into the left atrium.  4. The aortic valve was not well visualized. Aortic valve regurgitation is mild. Mild aortic valve stenosis. Aortic valve mean gradient measures 22.0 mmHg.  5. Aortic dilatation noted. There is mild dilatation of the ascending aorta, measuring 40 mm.  6. The inferior vena cava is dilated in size with <50% respiratory variability, suggesting right atrial pressure of 15 mmHg. Comparison(s): Mitral valve calcium  worse from 2020. Stable AS. FINDINGS  Left Ventricle: Left ventricular ejection fraction, by estimation, is 60 to 65%. The left ventricle has normal  function. The left ventricle has no regional wall motion abnormalities. The left ventricular internal cavity size was normal in size. There is  no left ventricular hypertrophy. Left ventricular diastolic parameters are indeterminate. Elevated left atrial pressure. Right Ventricle: The right ventricular size is normal. No increase in right ventricular wall thickness. Right ventricular systolic function is normal. Tricuspid regurgitation signal is inadequate for assessing PA pressure. Left Atrium: Left atrial size was normal in size. Right Atrium: Right atrial size was normal in size. Pericardium: There is no evidence of pericardial effusion. Mitral Valve: The mitral valve is abnormal. Moderate mitral annular calcification. No evidence of mitral valve regurgitation. Mild mitral valve stenosis. MV peak gradient, 9.4 mmHg. The mean mitral valve gradient is 6.0 mmHg with average heart rate of 81  bpm. Tricuspid Valve: The tricuspid valve is grossly normal. Tricuspid valve regurgitation is not demonstrated. No evidence of tricuspid stenosis. Aortic Valve: The  aortic valve was not well visualized. Aortic valve regurgitation is mild. Mild aortic stenosis is present. Aortic valve mean gradient measures 22.0 mmHg. Aortic valve peak gradient measures 33.6 mmHg. Aortic valve area, by VTI measures 1.34 cm. Pulmonic Valve: The pulmonic valve was not well visualized. Pulmonic valve regurgitation is not visualized. No evidence of pulmonic stenosis. Aorta: Aortic dilatation noted. There is mild dilatation of the ascending aorta, measuring 40 mm. Venous: The inferior vena cava is dilated in size with less than 50% respiratory variability, suggesting right atrial pressure of 15 mmHg. IAS/Shunts: No atrial level shunt detected by color flow Doppler.  LEFT VENTRICLE PLAX 2D LVIDd:         4.95 cm   Diastology LVIDs:         3.50 cm   LV e' medial:    7.18 cm/s LV PW:         1.10 cm   LV E/e' medial:  19.2 LV IVS:        0.90 cm   LV  e' lateral:   6.31 cm/s LVOT diam:     1.80 cm   LV E/e' lateral: 21.9 LV SV:         89 LV SV Index:   38 LVOT Area:     2.54 cm  RIGHT VENTRICLE RV S prime:     12.60 cm/s TAPSE (M-mode): 2.6 cm LEFT ATRIUM             Index        RIGHT ATRIUM           Index LA diam:        4.40 cm 1.87 cm/m   RA Area:     10.30 cm LA Vol (A2C):   55.9 ml 23.72 ml/m  RA Volume:   17.60 ml  7.47 ml/m LA Vol (A4C):   42.8 ml 18.16 ml/m LA Biplane Vol: 52.9 ml 22.44 ml/m  AORTIC VALVE AV Area (Vmax):    1.16 cm AV Area (Vmean):   1.09 cm AV Area (VTI):     1.34 cm AV Vmax:           290.00 cm/s AV Vmean:          223.000 cm/s AV VTI:            0.668 m AV Peak Grad:      33.6 mmHg AV Mean Grad:      22.0 mmHg LVOT Vmax:         132.00 cm/s LVOT Vmean:        95.100 cm/s LVOT VTI:          0.351 m LVOT/AV VTI ratio: 0.53  AORTA Ao Root diam: 2.80 cm Ao Asc diam:  4.00 cm MITRAL VALVE MV Area (PHT): 2.50 cm     SHUNTS MV Area VTI:   2.08 cm     Systemic VTI:  0.35 m MV Peak grad:  9.4 mmHg     Systemic Diam: 1.80 cm MV Mean grad:  6.0 mmHg MV Vmax:       1.54 m/s MV Vmean:      104.5 cm/s MV Decel Time: 304 msec MV E velocity: 138.00 cm/s MV A velocity: 155.00 cm/s MV E/A ratio:  0.89 Stanly Leavens MD Electronically signed by Stanly Leavens MD Signature Date/Time: 09/19/2021/11:44:51 AM    Final      Dartha Geralds, DO 09/19/2021, 8:50 AM PGY-1, [facility] Family Medicine FPTS Intern pager: [phone], text  pages welcome

## 2021-09-19 NOTE — Progress Notes (Signed)
Made a change to discharge medications.  Hydralazine is to be held given soft blood pressures, and followed up on outpatient with PCP to restart if indicated.  Only blood pressure medication to continue is Coreg.  Called nurse prior to patient leaving, and she will relay that to patient. ?

## 2021-09-19 NOTE — Evaluation (Signed)
Physical Therapy Evaluation ?Patient Details ?Name: [name] ?MRN: [id number] ?DOB: 1956-05-13 ?Today's Date: 09/19/2021 ? ?History of Present Illness ? 66 y.o. female presenting 09/17/21 from home to [facility] ED complaining of shortness of breath and bleeding from stoma. EGD found 2 non-bleeding cratered duodenal ulcers. CXR notable for central vascular congestion and streaky basilar scarring and atelectasis. PMH end-stage renal disease on HD TTS, type 2 diabetes mellitus, chronic diastolic heart failure, hyperlipidemia, hypertension  ?Clinical Impression ? Patient admitted with above findings. Patient presents with generalized weakness, decreased activity tolerance, poor safety awareness, and impaired balance. Patient requires minA for ambulation with use of cane and rail in hallway. Educated patient on use of RW at home for safety and to reduce fall risk, patient declined. Patient ambulated on RA with drop in spO2 to 88% but quickly rebounded to 90% with pursed lip breathing. Patient will benefit from skilled PT services during acute stay to address listed deficits. Recommend HHPT at discharge to maximize functional independence and safety in the home.    ?   ? ?Recommendations for follow up therapy are one component of a multi-disciplinary discharge planning process, led by the attending physician.  Recommendations may be updated based on patient status, additional functional criteria and insurance authorization. ? ?Follow Up Recommendations Home health PT ? ?  ?Assistance Recommended at Discharge Intermittent Supervision/Assistance  ?Patient can return home with the following ? A little help with walking and/or transfers;A little help with bathing/dressing/bathroom;Assistance with cooking/housework;Help with stairs or ramp for entrance ? ?  ?Equipment Recommendations None recommended by PT (per patient, she has necessary equipment)  ?Recommendations for Other Services ?    ?  ?Functional Status Assessment Patient has  had a recent decline in their functional status and demonstrates the ability to make significant improvements in function in a reasonable and predictable amount of time.  ? ?  ?Precautions / Restrictions Precautions ?Precautions: Fall ?Precaution Comments: watch O2 ?Restrictions ?Weight Bearing Restrictions: No  ? ?  ? ?Mobility ? Bed Mobility ?Overal bed mobility: Needs Assistance ?Bed Mobility: Supine to Sit ?  ?  ?Supine to sit: Min guard, HOB elevated ?  ?  ?General bed mobility comments: min guard for safety. using bed rail and cane to get OOB ?  ? ?Transfers ?Overall transfer level: Needs assistance ?Equipment used: Straight cane ?Transfers: Sit to/from Stand ?Sit to Stand: Min guard ?  ?  ?  ?  ?  ?General transfer comment: min guard for safety. Able to steady self upon standing ?  ? ?Ambulation/Gait ?Ambulation/Gait assistance: Min assist ?Gait Distance (Feet): 100 Feet ?Assistive device: Straight cane ?Gait Pattern/deviations: Step-to pattern, Decreased stride length, Trunk flexed ?Gait velocity: decreased ?  ?  ?General Gait Details: minA for balance. Patient using cane and rail in hallway for balance. Ambulated on RA with spO2 dropping to 88%. Instructed patient on standing rest break and pursed lip breathing with increase back to 90% within 30 seconds. Standing rest break x 3 during mobility. Patient unsteady with mobility with utilizing cane alone but refuses to use RW as she states "I do better with the cane". ? ?Stairs ?  ?  ?  ?  ?  ? ?Wheelchair Mobility ?  ? ?Modified Rankin (Stroke Patients Only) ?  ? ?  ? ?Balance Overall balance assessment: Needs assistance ?Sitting-balance support: No upper extremity supported ?Sitting balance-Leahy Scale: Fair ?  ?  ?Standing balance support: Single extremity supported, During functional activity ?Standing balance-Leahy Scale: Poor ?Standing balance  comment: minA for balance with use of cane ?  ?  ?  ?  ?  ?  ?  ?  ?  ?  ?  ?   ? ? ? ?Pertinent Vitals/Pain  Pain Assessment ?Pain Assessment: No/denies pain  ? ? ?Home Living Family/patient expects to be discharged to:: Private residence ?Living Arrangements: Children ?Available Help at Discharge: Family;Available 24 hours/day ?Type of Home: House ?Home Access: Stairs to enter ?Entrance Stairs-Rails: Can reach both ?Entrance Stairs-Number of Steps: 1 ?  ?Home Layout: One level ?Home Equipment: Cane - single Barista (2 wheels) ?   ?  ?Prior Function Prior Level of Function : Needs assist;History of Falls (last six months) ?  ?  ?  ?Physical Assist : ADLs (physical) ?  ?ADLs (physical): Bathing ?Mobility Comments: uses cane for primary mobility. reports 2 falls in past 6 months ?ADLs Comments: daughter assists with washing back in the shower ?  ? ? ?Hand Dominance  ? Dominant Hand: Right ? ?  ?Extremity/Trunk Assessment  ? Upper Extremity Assessment ?Upper Extremity Assessment: Defer to OT evaluation ?  ? ?Lower Extremity Assessment ?Lower Extremity Assessment: Generalized weakness ?  ? ?Cervical / Trunk Assessment ?Cervical / Trunk Assessment: Kyphotic  ?Communication  ? Communication: No difficulties  ?Cognition Arousal/Alertness: Awake/alert ?Behavior During Therapy: [facility] for tasks assessed/performed ?Overall Cognitive Status: Within Functional Limits for tasks assessed ?  ?  ?  ?  ?  ?  ?  ?  ?  ?  ?  ?  ?  ?  ?  ?  ?General Comments: poor safety awareness with declining use of RW ?  ?  ? ?  ?General Comments   ? ?  ?Exercises    ? ?Assessment/Plan  ?  ?PT Assessment Patient needs continued PT services  ?PT Problem List Decreased strength;Decreased activity tolerance;Decreased balance;Decreased mobility;Decreased safety awareness;Decreased knowledge of use of DME ? ?   ?  ?PT Treatment Interventions DME instruction;Gait training;Functional mobility training;Therapeutic activities;Therapeutic exercise;Balance training;Patient/family education   ? ?PT Goals (Current goals can be found in the Care Plan section)   ?Acute Rehab PT Goals ?Patient Stated Goal: to go home ?PT Goal Formulation: With patient ?Time For Goal Achievement: 10/03/21 ?Potential to Achieve Goals: Fair ? ?  ?Frequency Min 3X/week ?  ? ? ?Co-evaluation   ?  ?  ?  ?  ? ? ?  ?AM-PAC PT "6 Clicks" Mobility  ?Outcome Measure Help needed turning from your back to your side while in a flat bed without using bedrails?: A Little ?Help needed moving from lying on your back to sitting on the side of a flat bed without using bedrails?: A Little ?Help needed moving to and from a bed to a chair (including a wheelchair)?: A Little ?Help needed standing up from a chair using your arms (e.g., wheelchair or bedside chair)?: A Little ?Help needed to walk in hospital room?: A Little ?Help needed climbing 3-5 steps with a railing? : A Lot ?6 Click Score: 17 ? ?  ?End of Session Equipment Utilized During Treatment: Gait belt ?Activity Tolerance: Patient limited by fatigue ?Patient left: in chair;with call bell/phone within reach ?Nurse Communication: Mobility status ?PT Visit Diagnosis: Unsteadiness on feet (R26.81);Muscle weakness (generalized) (M62.81) ?  ? ?Time: 4967-5916 ?PT Time Calculation (min) (ACUTE ONLY): 35 min ? ? ?Charges:   PT Evaluation ?$PT Eval Moderate Complexity: 1 Mod ?PT Treatments ?$Therapeutic Activity: 8-22 mins ?  ?   ? ? ?Christophere Hillhouse A.  Gilford Rile, PT, DPT ?Acute Rehabilitation Services ?Pager [phone] ?Office [phone] ? ? ?Korinne Greenstein A Kenyon Eichelberger ?09/19/2021, 10:12 AM ? ?

## 2021-09-19 NOTE — Hospital Course (Addendum)
[name] is a 66 y.o.female with a history of ESRD on HD, T2DM, HTN, CAD s/p stent x2, hx of MI (2017), PAD, HLD, GERD, chronic combined systolic and diastolic CHF, gout, asthma, hep C who was admitted to the [facility] at [facility] for anemia w/ blood in stoma. Her hospital course is detailed below: ? ?Concern for GI bleed with acute on chronic normocytic anemia, s/p colostomy for necrotizing fasciitis ?Patient presented with hemoglobin 8.5 and notice of minimal bleeding into colostomy bag with dark stools. Stoma bleeding ceased and she had no obvious sign of bleeding. GI was consulted and noted that dark stool likely secondary to Vephoro binders which turns stool black.  It is also suspected that acute drop in hemoglobin due to increased amount of dialysis as patient was having increased fluid retention.   ? ?Shortness of breath  Hypotension  Chest Pain ?Patient presented to ED after experiencing hypotension and shortness of breath at hemodialysis session that morning which was notably a second session in a 2-day time.  Due to excess fluid retention.  Troponins negative x2 and patient was stable on room air.  CXR notable for central vascular congestion and streaky basilar scarring atelectasis without pulmonary edema or pleural effusions.  Echocardiogram with LVEF 60-65%, no wall motion abnormalities, moderate aortic stenosis, worsened mitral valve calcium from 2020.These symptoms resolved while in the ED and did not require medical intervention while admitted. No ACS concern. She had a few softer blood pressures so Coreg home dose was halved to 12.'5mg'$ .  Ambulatory walk test revealed desat to 88% that resovled to 90s with pursed lip breathing. No home oxygen required. ? ?T2DM ?Patient was NPO in anticipation of potential GI procedure. With renal diet added back on, patient received SSI and had 4units total over 24 hours. CBG well controlled in 100s and 200s. Home 160u NPH resumed at  discharge with recommendation for endocrinology follow up. ? ?ESRD on HD ?Nephrology consulted and patient received HD on 3/9, tolerated well although had transient drop in BP to 70s/40s. Received ESA. ? ? ?Other chronic conditions were medically managed with home medications and formulary alternatives as necessary (Metabolic bone disease, CAD h/o stents) ? ?PCP Follow-up Recommendations: ?Ensure adequate endocrinology follow up, compliance with insulin regimen ?Frequent fall history- consider discontinuing oxycodone/ other beers list medications harmful in the elderly ? ?

## 2021-09-19 NOTE — Progress Notes (Signed)
SATURATION QUALIFICATIONS: (This note is used to comply with regulatory documentation for home oxygen) ? ?Patient Saturations on Room Air at Rest = 95% ? ?Patient Saturations on Room Air while Ambulating = 88% (rebounded to 90% within 30 seconds of pursed lip breathing) ? ?Please briefly explain why patient needs home oxygen: Educated provided on pursed lip breathing and deep breathing techniques during mobility to aide in maintaining spO2 >88%, patient verbalized understanding.  ?

## 2021-09-19 NOTE — Progress Notes (Addendum)
[facility] Progress Note   Subjective: Seen in room. Up in chair. Was able to lower wt to 122.7 kg yesterday in HD. Hoping to go home today. Has chair at HD unit tomorrow on regular schedule.    Objective Vitals:   09/18/21 1820 09/18/21 2146 09/19/21 0456 09/19/21 1018  BP: (!) 158/70 137/90 (!) 110/38 (!) 114/46  Pulse: 80 83 80 74  Resp: '18  18 18  '$ Temp: 98.1 F (36.7 C)  98.3 F (36.8 C) 98.4 F (36.9 C)  TempSrc: Oral  Oral Oral  SpO2: 97%  95% 97%  Weight:      Height:       Physical Exam General: pleasant obese female in NAD Heart: B1,Q9 2/6 systolic M Lungs: CTAB Abdomen: Ostomy with dark stool. Obese, NABS.  Extremities: very trace LE edema Dialysis Access: L AVF + T/B  Additional Objective Labs: Basic Metabolic Panel: Recent Labs  Lab 09/17/21 1453 09/18/21 0332 09/18/21 1319  NA 136 140 137  K 4.2 4.3 4.6  CL 95* 101 99  CO2 '28 25 23  '$ GLUCOSE 227* 167* 152*  BUN 36* 41* 50*  CREATININE 6.88* 7.73* 8.30*  CALCIUM 8.0* 8.0* 8.1*  PHOS  --   --  5.7*   Liver Function Tests: Recent Labs  Lab 09/18/21 1319  ALBUMIN 3.0*   No results for input(s): LIPASE, AMYLASE in the last 168 hours. CBC: Recent Labs  Lab 09/17/21 1453 09/18/21 0332 09/18/21 1318 09/19/21 1103  WBC 7.1 6.0 6.5 8.0  NEUTROABS 5.0  --   --   --   HGB 8.5* 8.5* 8.3* 9.6*  HCT 27.5* 27.7* 26.7* 30.4*  MCV 94.8 96.5 94.3 92.7  PLT 193 198 195 198   Blood Culture    Component Value Date/Time   SDES BLOOD RIGHT ANTECUBITAL 04/20/2021 2030   SPECREQUEST  04/20/2021 2030    BOTTLES DRAWN AEROBIC AND ANAEROBIC Blood Culture adequate volume   CULT  04/20/2021 2030    NO GROWTH 5 DAYS Performed at [facility], [place] [address] [name], [place] [address]    REPTSTATUS 04/25/2021 FINAL 04/20/2021 2030    Cardiac Enzymes: No results for input(s): CKTOTAL, CKMB, CKMBINDEX, TROPONINI in the last 168 hours. CBG: Recent Labs  Lab 09/18/21 1448  09/18/21 1818 09/18/21 2108 09/19/21 0736 09/19/21 1214  GLUCAP 132* 142* 215* 217* 337*   Iron Studies: No results for input(s): IRON, TIBC, TRANSFERRIN, FERRITIN in the last 72 hours. '@lablastinr3'$ @ Studies/Results: DG Chest Portable 1 View  Result Date: 09/17/2021 CLINICAL DATA:  Shortness of breath during dialysis. EXAM: PORTABLE CHEST 1 VIEW COMPARISON:  04/21/2021 FINDINGS: The heart is within normal limits in size given the AP projection portable technique. Stable coronary artery stent. Central vascular congestion and streaky basilar scarring and atelectasis but no overt pulmonary edema or pleural effusions. IMPRESSION: Central vascular congestion and streaky basilar scarring and atelectasis. No pulmonary edema or pleural effusions. Electronically Signed   By: Marijo Sanes M.D.   On: 09/17/2021 14:47   ECHOCARDIOGRAM COMPLETE  Result Date: 09/19/2021    ECHOCARDIOGRAM REPORT   Patient Name:   [name] Date of Exam: 09/19/2021 Medical Rec #:  [id number]     Height:       68.0 in Accession #:    [id number]    Weight:       279.8 lb Date of Birth:  11/11/1955     BSA:  2.357 m Patient Age:    66 years      BP:           110/38 mmHg Patient Gender: F             HR:           78 bpm. Exam Location:  Inpatient Procedure: 2D Echo, Color Doppler and Cardiac Doppler Indications:    I50.9* Heart failure (unspecified)  History:        Patient has prior history of Echocardiogram examinations, most                 recent 07/29/2018. CHF, CAD, COPD; Risk Factors:Hypertension,                 Diabetes, Dyslipidemia, Sleep Apnea and ESRD.  Sonographer:    Raquel Sarna Senior RDCS Referring Phys: [id number] Regan Lemming IMPRESSIONS  1. Left ventricular ejection fraction, by estimation, is 60 to 65%. The left ventricle has normal function. The left ventricle has no regional wall motion abnormalities. Left ventricular diastolic parameters are indeterminate. Elevated left atrial pressure.  2. Right ventricular  systolic function is normal. The right ventricular size is normal. Tricuspid regurgitation signal is inadequate for assessing PA pressure.  3. No evidence of mitral valve regurgitation. Mild mitral stenosis. The mean mitral valve gradient is 6.0 mmHg with average heart rate of 81 bpm. Moderate mitral annular calcification. MVA by continuity equation 2 cm2. There is an abnormal calcification  on the mitral valve that extended into the left atrium.  4. The aortic valve was not well visualized. Aortic valve regurgitation is mild. Mild aortic valve stenosis. Aortic valve mean gradient measures 22.0 mmHg.  5. Aortic dilatation noted. There is mild dilatation of the ascending aorta, measuring 40 mm.  6. The inferior vena cava is dilated in size with <50% respiratory variability, suggesting right atrial pressure of 15 mmHg. Comparison(s): Mitral valve calcium worse from 2020. Stable AS. FINDINGS  Left Ventricle: Left ventricular ejection fraction, by estimation, is 60 to 65%. The left ventricle has normal function. The left ventricle has no regional wall motion abnormalities. The left ventricular internal cavity size was normal in size. There is  no left ventricular hypertrophy. Left ventricular diastolic parameters are indeterminate. Elevated left atrial pressure. Right Ventricle: The right ventricular size is normal. No increase in right ventricular wall thickness. Right ventricular systolic function is normal. Tricuspid regurgitation signal is inadequate for assessing PA pressure. Left Atrium: Left atrial size was normal in size. Right Atrium: Right atrial size was normal in size. Pericardium: There is no evidence of pericardial effusion. Mitral Valve: The mitral valve is abnormal. Moderate mitral annular calcification. No evidence of mitral valve regurgitation. Mild mitral valve stenosis. MV peak gradient, 9.4 mmHg. The mean mitral valve gradient is 6.0 mmHg with average heart rate of 81  bpm. Tricuspid Valve: The  tricuspid valve is grossly normal. Tricuspid valve regurgitation is not demonstrated. No evidence of tricuspid stenosis. Aortic Valve: The aortic valve was not well visualized. Aortic valve regurgitation is mild. Mild aortic stenosis is present. Aortic valve mean gradient measures 22.0 mmHg. Aortic valve peak gradient measures 33.6 mmHg. Aortic valve area, by VTI measures 1.34 cm. Pulmonic Valve: The pulmonic valve was not well visualized. Pulmonic valve regurgitation is not visualized. No evidence of pulmonic stenosis. Aorta: Aortic dilatation noted. There is mild dilatation of the ascending aorta, measuring 40 mm. Venous: The inferior vena cava is dilated in size with less than 50% respiratory variability,  suggesting right atrial pressure of 15 mmHg. IAS/Shunts: No atrial level shunt detected by color flow Doppler.  LEFT VENTRICLE PLAX 2D LVIDd:         4.95 cm   Diastology LVIDs:         3.50 cm   LV e' medial:    7.18 cm/s LV PW:         1.10 cm   LV E/e' medial:  19.2 LV IVS:        0.90 cm   LV e' lateral:   6.31 cm/s LVOT diam:     1.80 cm   LV E/e' lateral: 21.9 LV SV:         89 LV SV Index:   38 LVOT Area:     2.54 cm  RIGHT VENTRICLE RV S prime:     12.60 cm/s TAPSE (M-mode): 2.6 cm LEFT ATRIUM             Index        RIGHT ATRIUM           Index LA diam:        4.40 cm 1.87 cm/m   RA Area:     10.30 cm LA Vol (A2C):   55.9 ml 23.72 ml/m  RA Volume:   17.60 ml  7.47 ml/m LA Vol (A4C):   42.8 ml 18.16 ml/m LA Biplane Vol: 52.9 ml 22.44 ml/m  AORTIC VALVE AV Area (Vmax):    1.16 cm AV Area (Vmean):   1.09 cm AV Area (VTI):     1.34 cm AV Vmax:           290.00 cm/s AV Vmean:          223.000 cm/s AV VTI:            0.668 m AV Peak Grad:      33.6 mmHg AV Mean Grad:      22.0 mmHg LVOT Vmax:         132.00 cm/s LVOT Vmean:        95.100 cm/s LVOT VTI:          0.351 m LVOT/AV VTI ratio: 0.53  AORTA Ao Root diam: 2.80 cm Ao Asc diam:  4.00 cm MITRAL VALVE MV Area (PHT): 2.50 cm     SHUNTS MV Area  VTI:   2.08 cm     Systemic VTI:  0.35 m MV Peak grad:  9.4 mmHg     Systemic Diam: 1.80 cm MV Mean grad:  6.0 mmHg MV Vmax:       1.54 m/s MV Vmean:      104.5 cm/s MV Decel Time: 304 msec MV E velocity: 138.00 cm/s MV A velocity: 155.00 cm/s MV E/A ratio:  0.89 Rudean Haskell MD Electronically signed by Rudean Haskell MD Signature Date/Time: 09/19/2021/11:44:51 AM    Final    Medications:   allopurinol  100 mg Oral Daily   atorvastatin  80 mg Oral QPM   carvedilol  12.5 mg Oral BID WC   Chlorhexidine Gluconate Cloth  6 each Topical Q0600   cinacalcet  60 mg Oral Q T,Th,Sa-HD   darbepoetin (ARANESP) injection - DIALYSIS  100 mcg Intravenous Q Thu-HD   doxercalciferol  9 mcg Intravenous Q T,Th,Sa-HD   fluticasone furoate-vilanterol  1 puff Inhalation Daily   insulin aspart  0-9 Units Subcutaneous TID WC   insulin NPH Human  60 Units Subcutaneous Once   montelukast  10 mg Oral QHS  multivitamin  1 tablet Oral QHS   pregabalin  25 mg Oral BID   sucroferric oxyhydroxide  1,500 mg Oral TID WC     Dialysis Orders: Center: [facility] T,Th,S 4 hrs 180NRe 450/600 119 kg 2.0 K/2.0 Ca AVF -No heparin -Hectorol 9 mcg IV TIW  -Micera 60 mcg IV q 2 weeks (last dose 09/04/2021) -Sensipar 60 mg PO TIW   Assessment/Plan:  GI Bleed: HGB 9.6 this AM after 4.5 liters of fluid removed in HD 09/18/2021. Given Aranesp 100 mcg IV with HD03/03/2022. Last OP HGB 9.3 09/13/2021. On Velphoro binders which turns stool black. W/U per primary.   ESRD -  T,Th,S. Chronic issues with high IDWG/missed and truncated treatments. Next HD 09/20/2021 hopefully at [facility].   Hypertension/volume  -Chronic noncompliance with sodium and fluid restrictions. HD 09/18/2021. BP transiently dropped, placed in sequential to continue volume removal. Net UF 4.5 liters. Post wt 122.7 kg. First time we've really been able to get volume down in quite sometime. DO think she has gained real body wt. Will raise EDW 1.5 kg on  discharge.  Usually hypertensive on carvedilol, hydralazine.   Anemia  - See # 1  Metabolic bone disease -  PO4 8.9 09/13/2021 at [facility]. Continue binders, VDRA, sensipar.   Nutrition - Renal Carb mod diet when able to eat.   DMT2-uncontrolled on high insulin dose. Per primary  Chronic systolic and diastolic HF: Attempt to optimize volume but patient is noncompliant with fluid restrictions, sodium restrictions and HD.  CAD-H/O stents. Per primary  Disposition: Stable from Nephrology stand point for discharge    Chole Driver H. Phillip Sandler NP-C 09/19/2021, 12:18 PM  [employer] [phone]

## 2021-09-20 ENCOUNTER — Telehealth: Payer: Self-pay | Admitting: Nurse Practitioner

## 2021-09-20 NOTE — Telephone Encounter (Signed)
Transition of care contact from inpatient facility ? ?Date of discharge: 09/19/2021 ?Date of contact: 09/20/2021 ?Method: Phone ?Spoke to: Patient ? ?Patient contacted to discuss transition of care from recent inpatient hospitalization. Patient was admitted to [facility] from 03/09-03/04/2022 with discharge diagnosis of dark stools, volume overload.  ? ?Medication changes were reviewed. Patient C/O itching. Korsuva protocol ordered at OP clinic. Recommended OTC Benadryl and Cerave Itch relief cream.  ? ?Patient will follow up with his/her outpatient HD unit on: 09/20/2021 ? ? ?

## 2021-09-25 ENCOUNTER — Other Ambulatory Visit: Payer: Self-pay | Admitting: Endocrinology

## 2021-10-16 ENCOUNTER — Other Ambulatory Visit: Payer: Self-pay | Admitting: Family Medicine

## 2021-10-16 IMAGING — CT CT ABD-PELV W/O CM
2 of 4 series · 16 of 46 positions shown, 18 images · IV contrast (APPLIED)
Comparison: 05/09/2018 abdominopelvic CT.

CLINICAL DATA: Right flank and lower abdominal pain for 2 weeks.
New onset of pain down right leg. Cholecystectomy.

EXAM:
CT ABDOMEN AND PELVIS WITHOUT CONTRAST, CT LUMBAR SPINE
TECHNIQUE: Multidetector CT imaging of the abdomen and pelvis was performed
following the standard protocol without IV contrast.
Multidetector CT imaging of the lumbar spine was performed without
IV contrast.

[Series 504: routine abd/pel wo · axial · 0.70mm/px · z∈[-877,-452]mm · 13 of 95 slices shown, 15 images]
[im 5/95  soft-tissue]
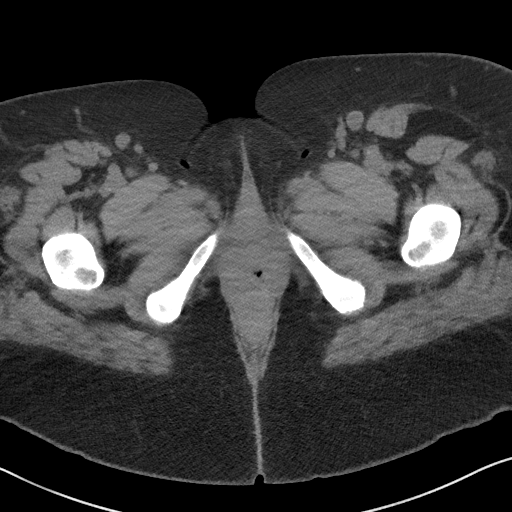
[im 5/95  bone]
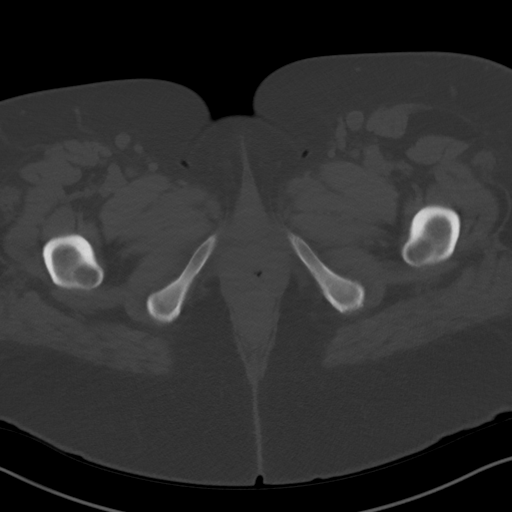
[im 13/95  soft-tissue]
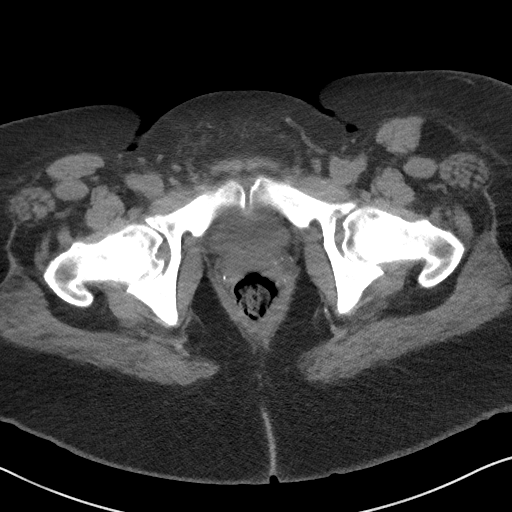
[im 21/95  soft-tissue]
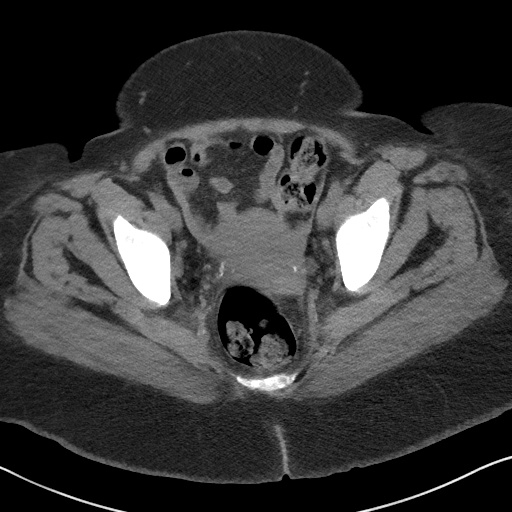
[im 25/95  soft-tissue]
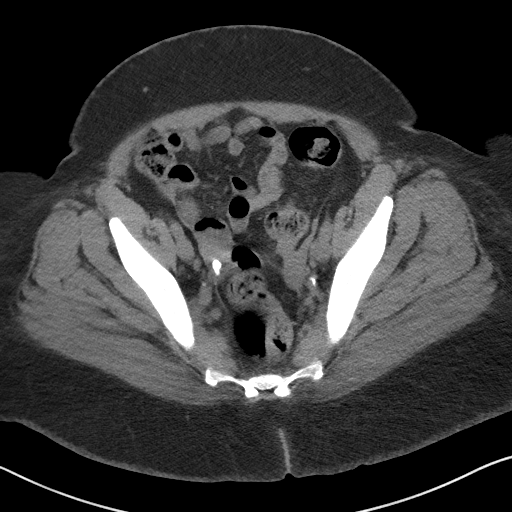
[im 33/95  soft-tissue]
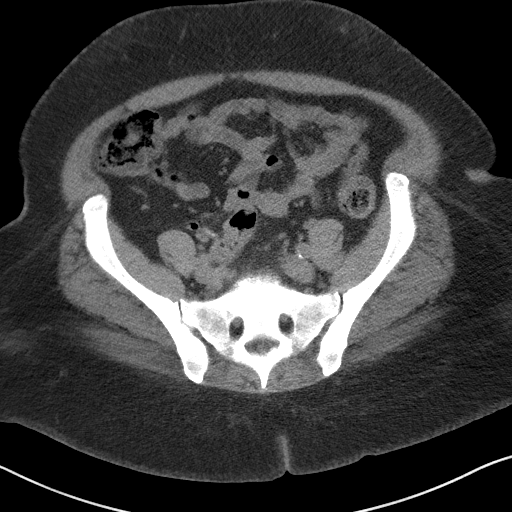
[im 41/95  soft-tissue]
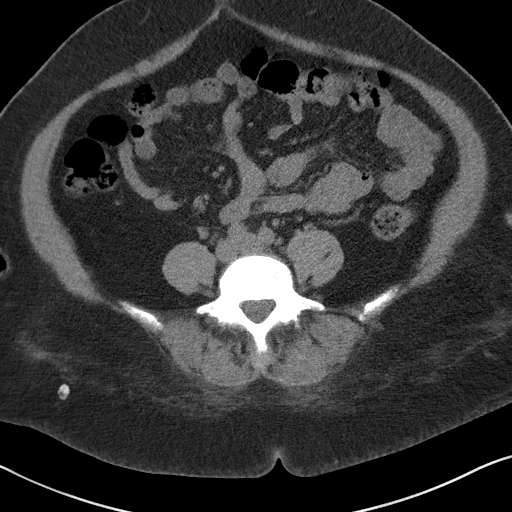
[im 50/95  soft-tissue]
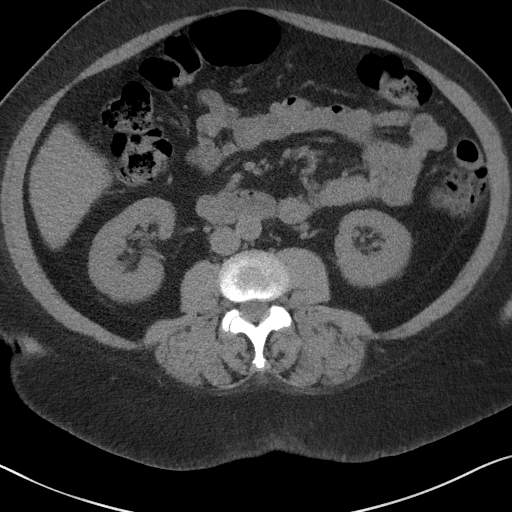
[im 54/95  soft-tissue]
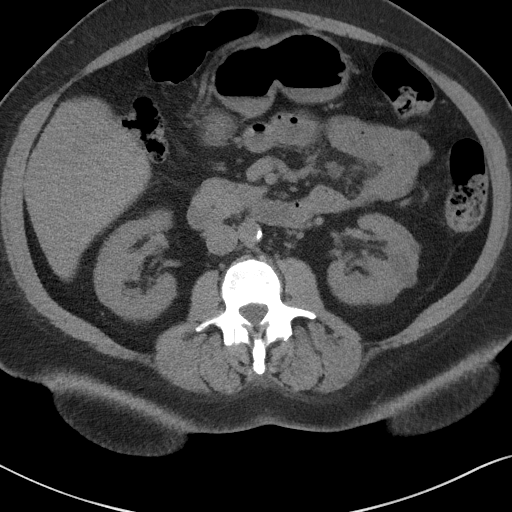
[im 62/95  soft-tissue]
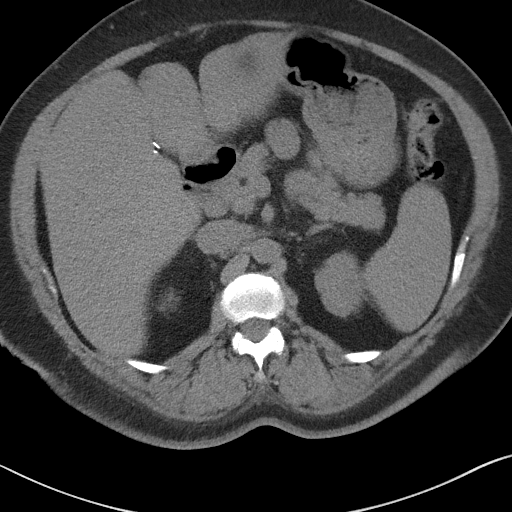
[im 62/95  bone]
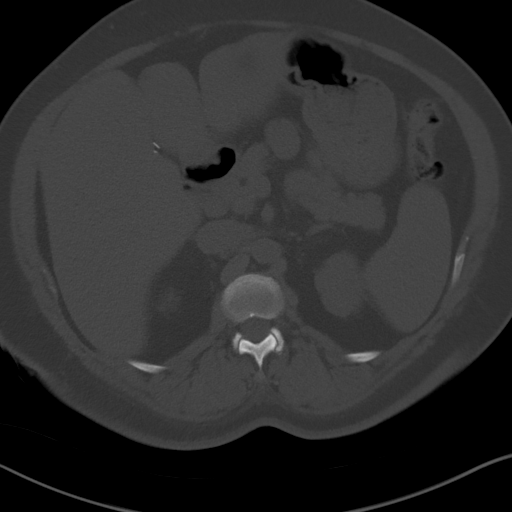
[im 70/95  soft-tissue]
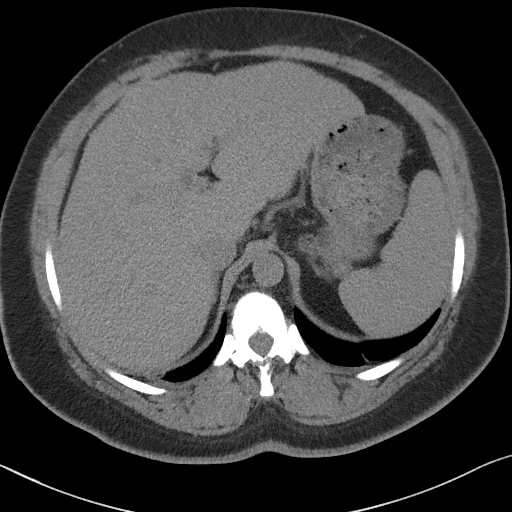
[im 74/95  soft-tissue]
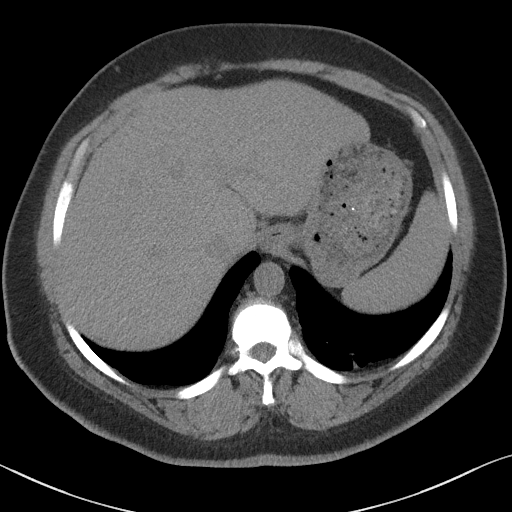
[im 82/95  soft-tissue]
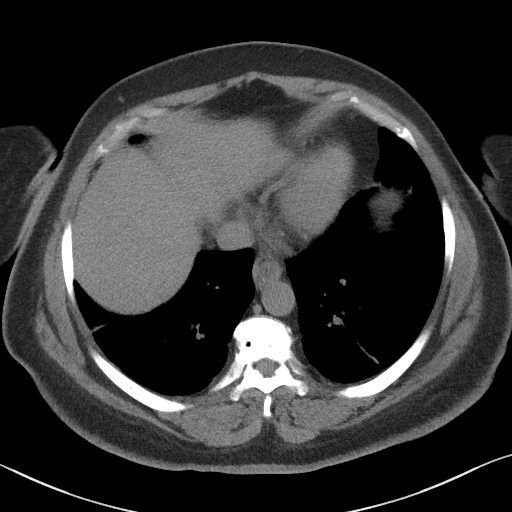
[im 90/95  soft-tissue]
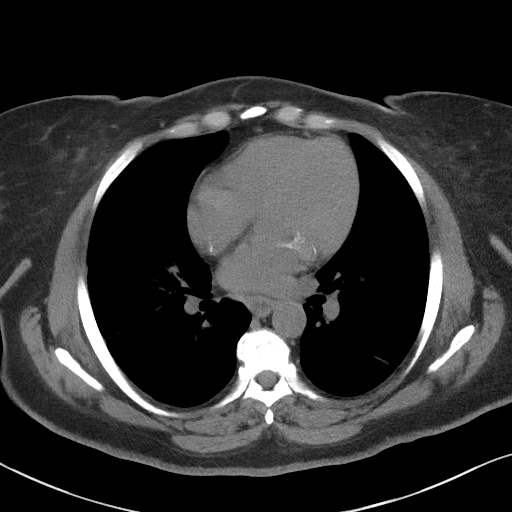

[Series 507: coronal st · coronal · 0.86mm/px · 3 of 122 slices shown]
[im 41/122  soft-tissue]
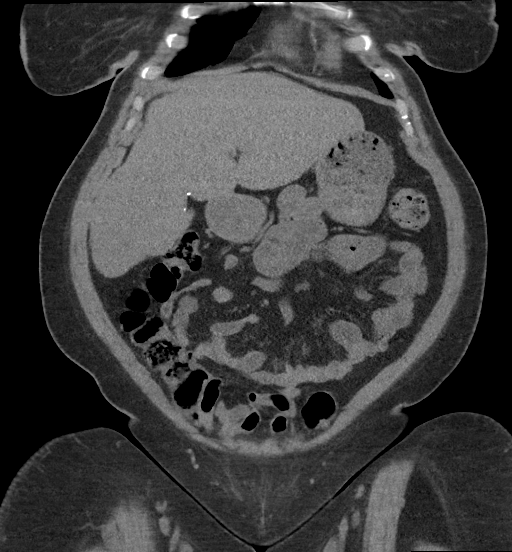
[im 54/122  soft-tissue]
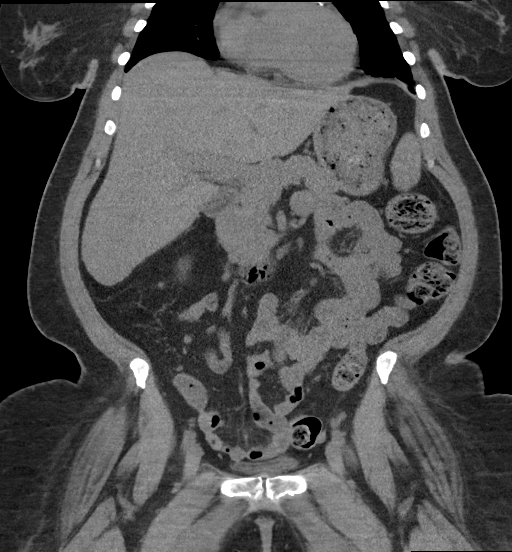
[im 68/122  soft-tissue]
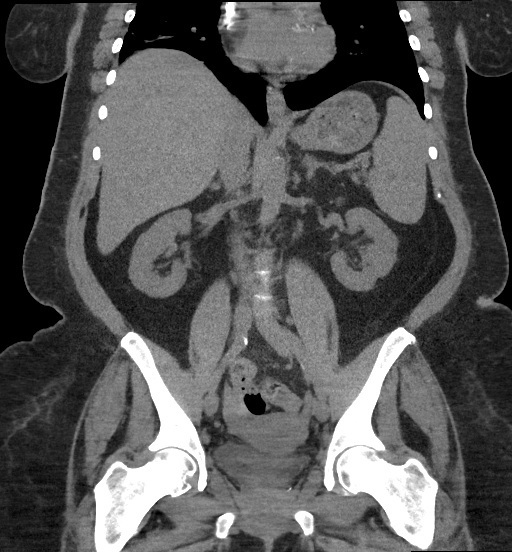

[16 of 46 positions shown; findings below may reference images not displayed]

FINDINGS: Lower chest: Bibasilar scarring. Incompletely imaged central line.
Multivessel coronary artery atherosclerosis.

Fluid within the distal esophagus including on 4/504

Hepatobiliary: Hepatomegaly at 20.2 cm craniocaudal.
Cholecystectomy, without biliary ductal dilatation.

Pancreas: Subcentimeter lipoma within the pancreatic head is not
clinically significant. No pancreatic duct dilatation or acute
inflammation.

Spleen: Normal in size, without focal abnormality.

Adrenals/Urinary Tract: Normal adrenal glands. Small low-density
left renal lesions are incompletely characterized but likely cysts.
No renal calculi or hydronephrosis. No hydroureter or ureteric
calculi. No bladder calculi.

Stomach/Bowel: Normal stomach, without wall thickening. Colonic
stool burden suggests constipation. Normal terminal ileum and
appendix. Normal small bowel.

Vascular/Lymphatic: Aortic atherosclerosis. No abdominopelvic
adenopathy.

Reproductive: Normal uterus and adnexa.

Other: No significant free fluid.

Musculoskeletal: No acute osseous abnormality. Lumbar spine images
include from the bottom of T12 through the upper sacrum. Maintenance
of vertebral body height. No focal osseous lesion. Mild loss of
intervertebral disc height at T12-L1.

Minimal disc bulge at L3-4 with ligamentum flavum thickening, but no
significant central canal or neural foraminal narrowing.

At L4-5, mild disc bulge and ligamentum flavum thickening result in
mild central canal stenosis and bilateral mild neural foraminal
narrowing.

At L5-S1, mild disc bulge without significant central canal or
neural foraminal narrowing.
IMPRESSION: 1.  No acute process in the abdomen or pelvis.
2. No acute osseous finding.  Lumbar spondylosis, as detailed above.
3. Esophageal air fluid level suggests dysmotility or
gastroesophageal reflux.
4. Hepatomegaly.
5.  Aortic Atherosclerosis (B4G2X-JYC.C).
6.  Possible constipation.

## 2021-10-16 IMAGING — CT CT L SPINE W/O CM
3 series · 12 of 33 positions shown, 14 images · IV contrast (APPLIED)
Comparison: 05/09/2018 abdominopelvic CT.

CLINICAL DATA: Right flank and lower abdominal pain for 2 weeks.
New onset of pain down right leg. Cholecystectomy.

EXAM:
CT ABDOMEN AND PELVIS WITHOUT CONTRAST, CT LUMBAR SPINE
TECHNIQUE: Multidetector CT imaging of the abdomen and pelvis was performed
following the standard protocol without IV contrast.
Multidetector CT imaging of the lumbar spine was performed without
IV contrast.

[Series 9: ax 2 x 2 soft tissue l-spine · axial · 0.34mm/px · z∈[-750,-578]mm · 4 of 126 slices shown, 5 images]
[im 20/126  soft-tissue]
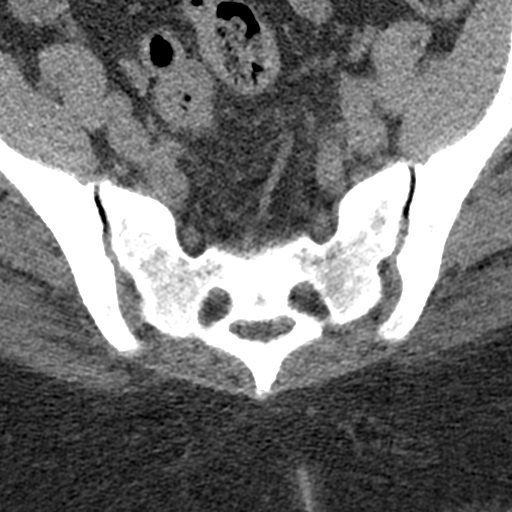
[im 20/126  bone]
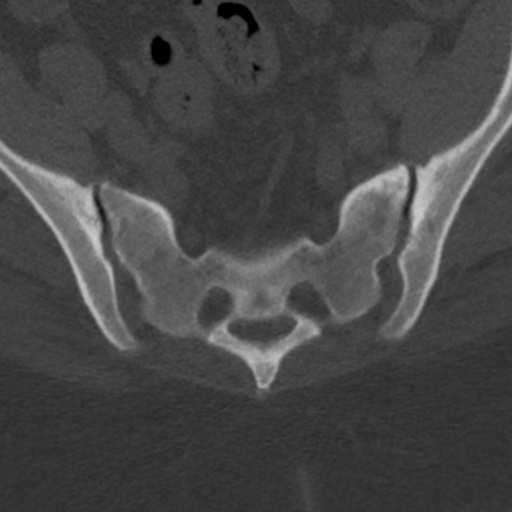
[im 49/126  bone]
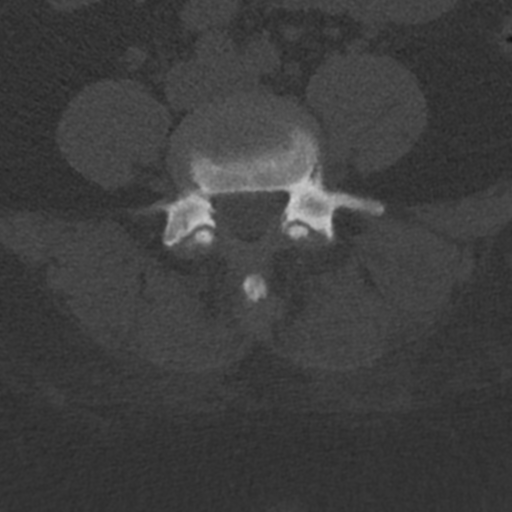
[im 77/126  bone]
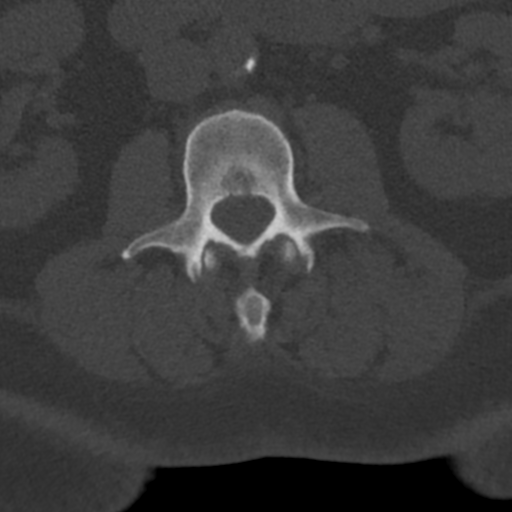
[im 106/126  bone]
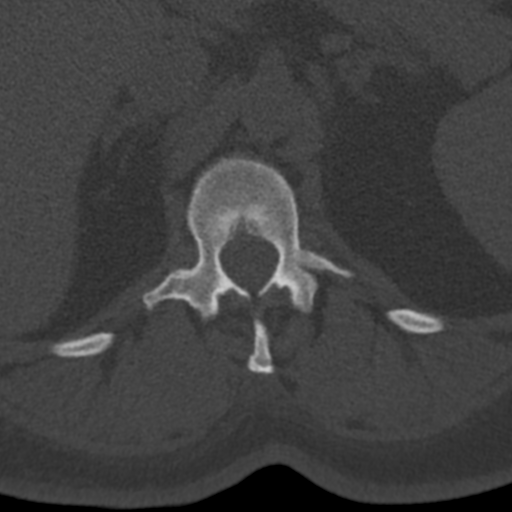

[Series 11: coronal l-spine · coronal · 0.31mm/px · 3 of 49 slices shown (1 of 2)]
[im 10/49  bone]
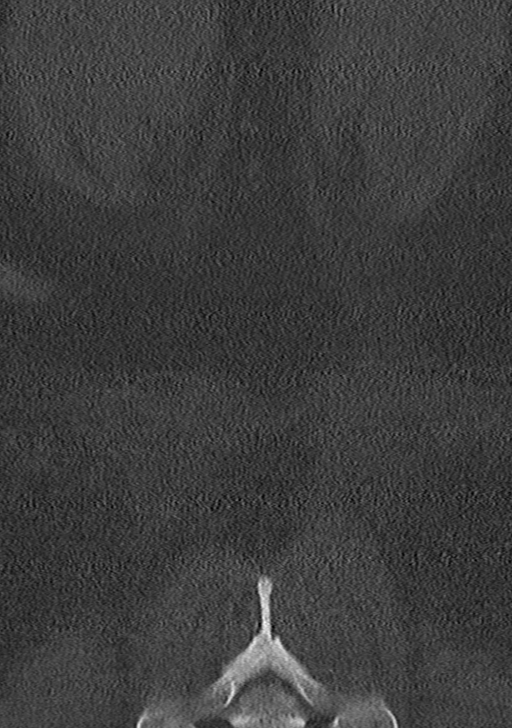
[im 20/49  bone]
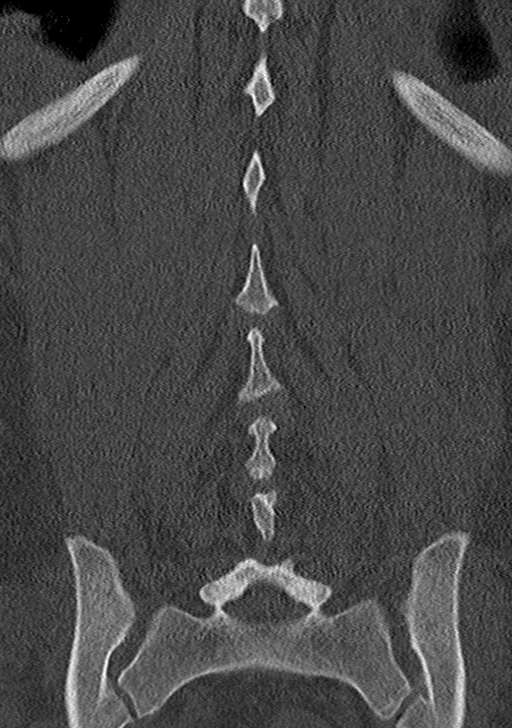
[im 29/49  bone]
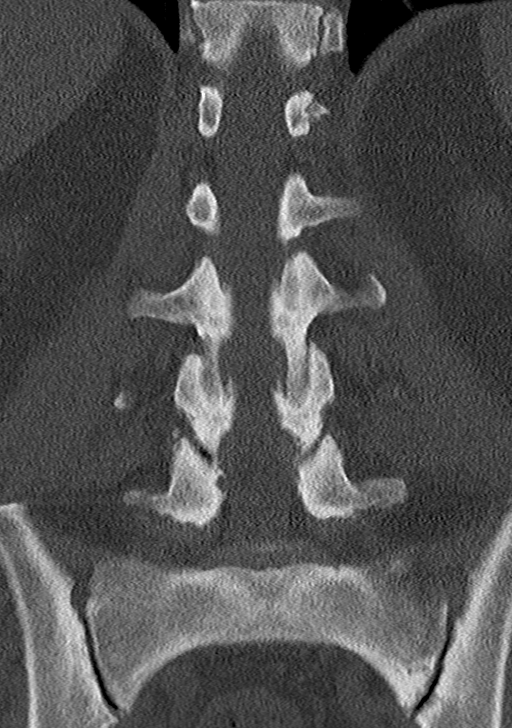

[Series 12: coronal l-spine · sagittal · 0.27mm/px · 5 of 78 slices shown, 6 images (2 of 2)]
[im 26/78  bone]
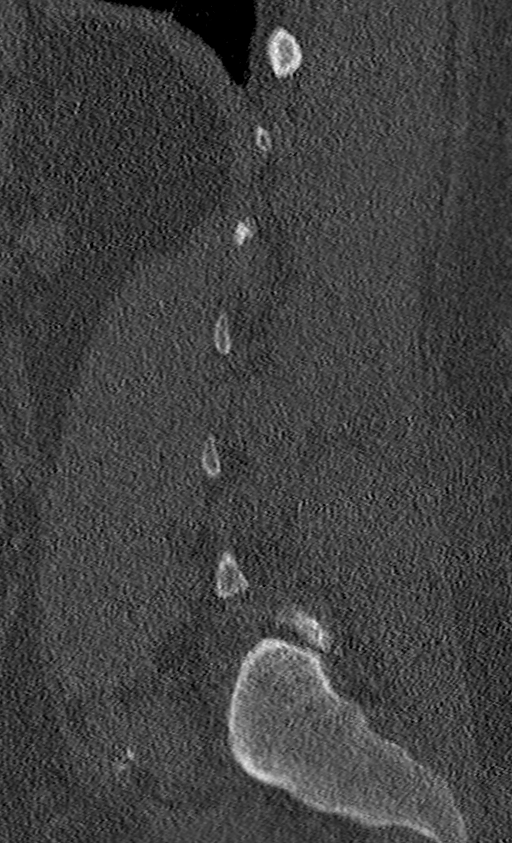
[im 33/78  bone]
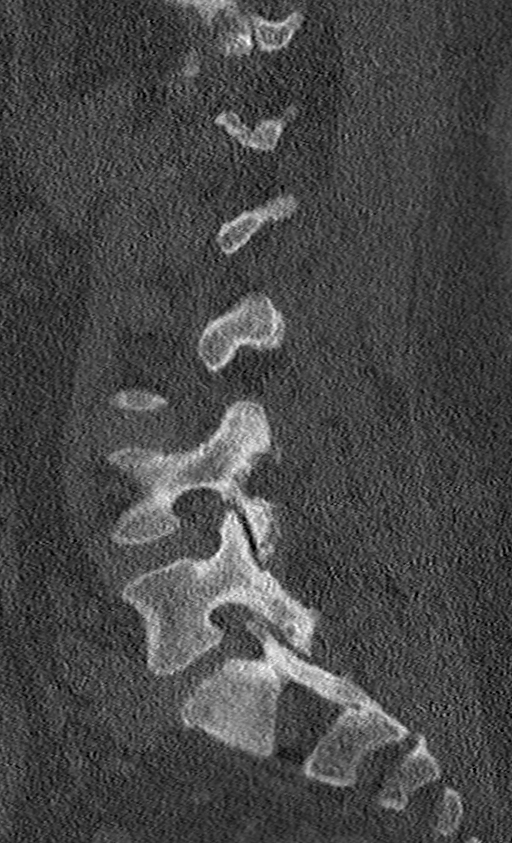
[im 39/78  soft-tissue]
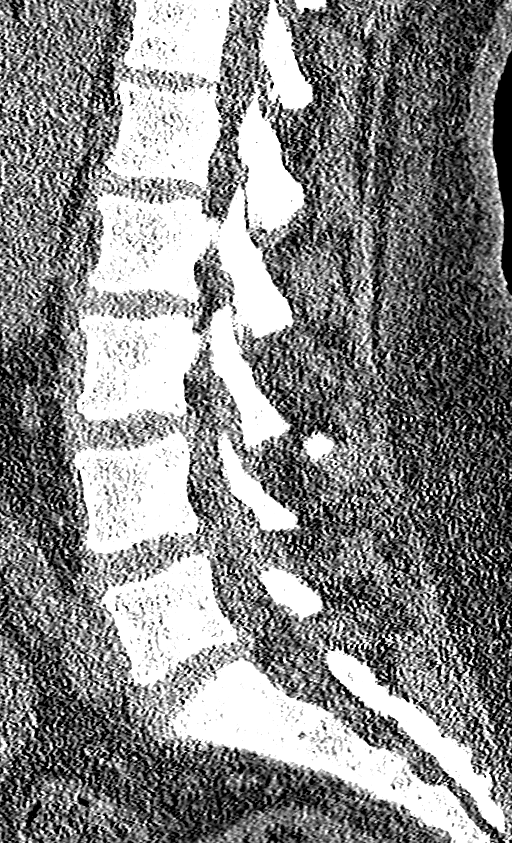
[im 39/78  bone]
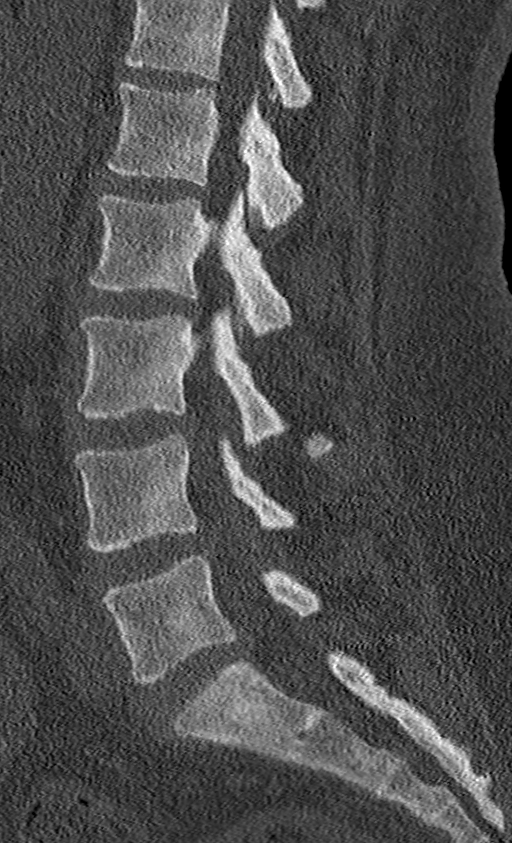
[im 45/78  bone]
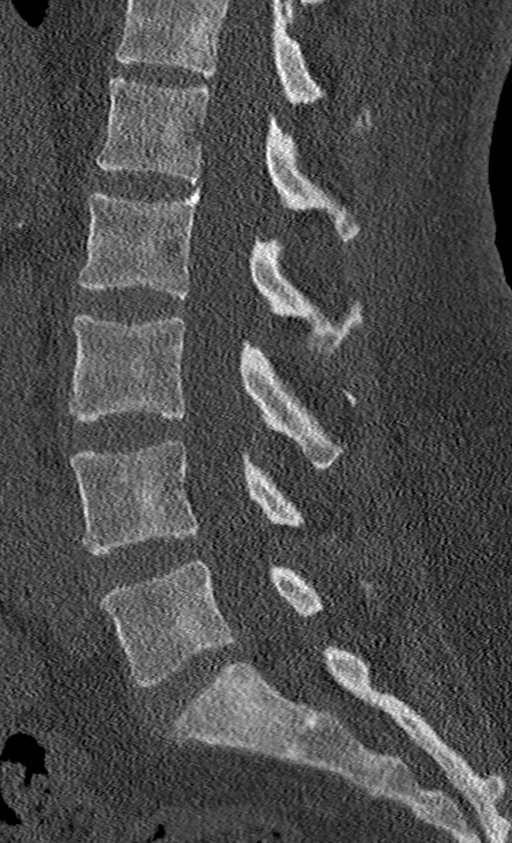
[im 52/78  bone]
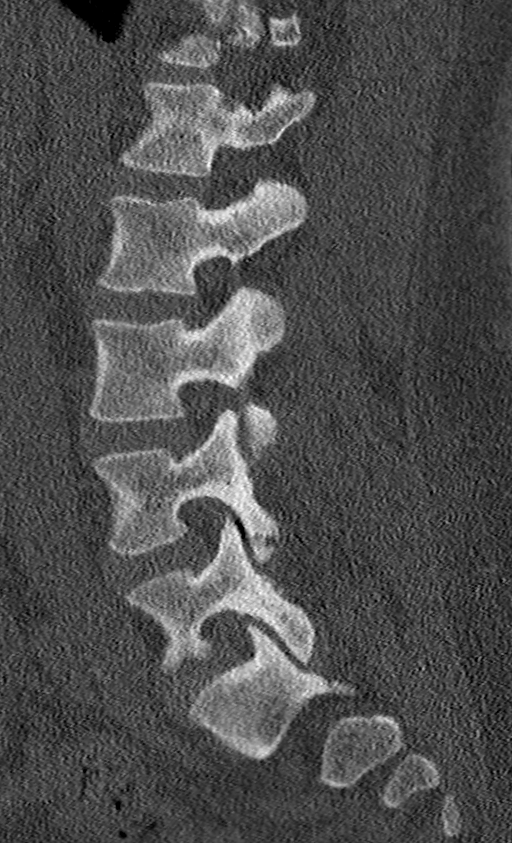

[12 of 33 positions shown; findings below may reference images not displayed]

FINDINGS: Lower chest: Bibasilar scarring. Incompletely imaged central line.
Multivessel coronary artery atherosclerosis.

Fluid within the distal esophagus including on 4/504

Hepatobiliary: Hepatomegaly at 20.2 cm craniocaudal.
Cholecystectomy, without biliary ductal dilatation.

Pancreas: Subcentimeter lipoma within the pancreatic head is not
clinically significant. No pancreatic duct dilatation or acute
inflammation.

Spleen: Normal in size, without focal abnormality.

Adrenals/Urinary Tract: Normal adrenal glands. Small low-density
left renal lesions are incompletely characterized but likely cysts.
No renal calculi or hydronephrosis. No hydroureter or ureteric
calculi. No bladder calculi.

Stomach/Bowel: Normal stomach, without wall thickening. Colonic
stool burden suggests constipation. Normal terminal ileum and
appendix. Normal small bowel.

Vascular/Lymphatic: Aortic atherosclerosis. No abdominopelvic
adenopathy.

Reproductive: Normal uterus and adnexa.

Other: No significant free fluid.

Musculoskeletal: No acute osseous abnormality. Lumbar spine images
include from the bottom of T12 through the upper sacrum. Maintenance
of vertebral body height. No focal osseous lesion. Mild loss of
intervertebral disc height at T12-L1.

Minimal disc bulge at L3-4 with ligamentum flavum thickening, but no
significant central canal or neural foraminal narrowing.

At L4-5, mild disc bulge and ligamentum flavum thickening result in
mild central canal stenosis and bilateral mild neural foraminal
narrowing.

At L5-S1, mild disc bulge without significant central canal or
neural foraminal narrowing.
IMPRESSION: 1.  No acute process in the abdomen or pelvis.
2. No acute osseous finding.  Lumbar spondylosis, as detailed above.
3. Esophageal air fluid level suggests dysmotility or
gastroesophageal reflux.
4. Hepatomegaly.
5.  Aortic Atherosclerosis (B4G2X-JYC.C).
6.  Possible constipation.

## 2021-10-28 ENCOUNTER — Encounter: Payer: Self-pay | Admitting: Endocrinology

## 2021-10-28 ENCOUNTER — Ambulatory Visit (INDEPENDENT_AMBULATORY_CARE_PROVIDER_SITE_OTHER): Payer: Medicare Other | Admitting: Endocrinology

## 2021-10-28 VITALS — BP 160/72 | HR 74 | Ht 68.0 in | Wt 277.6 lb

## 2021-10-28 DIAGNOSIS — E1100 Type 2 diabetes mellitus with hyperosmolarity without nonketotic hyperglycemic-hyperosmolar coma (NKHHC): Secondary | ICD-10-CM | POA: Diagnosis not present

## 2021-10-28 LAB — POCT GLYCOSYLATED HEMOGLOBIN (HGB A1C): Hemoglobin A1C: 8.1 % — AB (ref 4.0–5.6)

## 2021-10-28 NOTE — Progress Notes (Signed)
? ?Subjective:  ? ? Patient ID: [name], female    DOB: 1955/10/05, 66 y.o.   MRN: [id number] ? ?HPI ?Pt returns for f/u of diabetes mellitus: ?DM type: Insulin-requiring type 2 ?Dx'ed: 1990 ?Complications: ESRD (on HD), PN, CAD, PAD, and peroneal necrotizing fasciitis.  ?Therapy: insulin since 2002 ?GDM: never ?DKA: never ?Severe hypoglycemia: never ?Pancreatitis: 2002 ?Pancreatic imaging: normal on 2022 CT.   ?SDOH: none ?Other: she takes QD insulin, after noncompliance with multiple daily injections; she changed to NPH, due to pattern of cbg's.    ?Interval history: Pt says she sometimes misses the insulin.  no cbg record, but states cbg varies from 98-302.  She wants to be cleared for abd hernia surgery.   ?Past Medical History:  ?Diagnosis Date  ? Anemia   ? Aortic stenosis   ? Echo 8/18: mean 13, peak 28, LVOT/AV mean velocity 0.51  ? Arthritis   ? Asthma   ? As a child   ? Bronchitis   ? CAD (coronary artery disease)   ? a. 09/2016: 50% Ost 1st Mrg stenosis, 50% 2nd Mrg stenosis, 20% Mid-Cx, 95% Prox LAD, 40% mid-LAD, and 10% dist-LAD stenosis. Staged PCI with DES to Prox-LAD.   ? Chronic combined systolic and diastolic CHF (congestive heart failure) ([place]) 2011  ? echo 2/18: EF 55-60, normal wall motion, grade 2 diastolic dysfunction, trivial AI // echo 3/18: Septal and apical HK, EF 45-50, normal wall motion, trivial AI, mild LAE, PASP 38 // echo 8/18: EF 60-65, normal wall motion, grade 1 diastolic dysfunction, calcified aortic valve leaflets, mild aortic stenosis (mean 13, peak 28, LVOT/AV mean velocity 0.51), mild AI, moderate MAC, mild LAE, trivial TR   ? Chronic kidney disease   ? STAGE 4  ? Chronic kidney disease on chronic dialysis ([place])   ? t, th, sat  ? Complication of anesthesia   ? Depression   ? Diabetes mellitus Dx 1989  ? Elevated lipids   ? GERD (gastroesophageal reflux disease)   ? Gout   ? Heart murmur   ? asymptomatic  ? Hepatitis C Dx 2013  ? Hypertension Dx 1989  ? Infected surgical  wound   ? Lt arm  ? Myocardial infarction ([place]) 07/2015  ? Obesity   ? Pancreatitis 2013  ? Pneumonia   ? Refusal of blood transfusions as patient is Jehovah's Witness   ? pt states she is not [place] witness and does not refuse blood products  ? Tendinitis   ? Tremors of nervous system   ? LEFT HAND  ? Ulcer 2010  ? ? ?Past Surgical History:  ?Procedure Laterality Date  ? A/V FISTULAGRAM Left 04/11/2019  ? Procedure: A/V FISTULAGRAM;  Surgeon: Katha Cabal, MD;  Location: [place] [facility];  Service: Cardiovascular;  Laterality: Left;  ? A/V FISTULAGRAM Left 06/02/2019  ? Procedure: A/V FISTULAGRAM;  Surgeon: Katha Cabal, MD;  Location: [place] [facility];  Service: Cardiovascular;  Laterality: Left;  ? APPLICATION OF WOUND VAC Left 08/14/2017  ? Procedure: APPLICATION OF WOUND VAC Exchange;  Surgeon: Robert Bellow, MD;  Location: [facility];  Service: General;  Laterality: Left;  ? APPLICATION OF WOUND VAC Left 12/21/2018  ? Procedure: APPLICATION OF WOUND VAC;  Surgeon: Katha Cabal, MD;  Location: [facility];  Service: Vascular;  Laterality: Left;  ? AV FISTULA PLACEMENT Left 08/19/2018  ? Procedure: ARTERIOVENOUS (AV) FISTULA CREATION ( BRACHIOBASILIC );  Surgeon: Katha Cabal, MD;  Location: [facility];  Service: Vascular;  Laterality: Left;  ? BASCILIC VEIN TRANSPOSITION Left 11/18/2018  ? Procedure: BASCILIC VEIN TRANSPOSITION;  Surgeon: Katha Cabal, MD;  Location: [facility];  Service: Vascular;  Laterality: Left;  ? BIOPSY  09/20/2020  ? Procedure: BIOPSY;  Surgeon: Carol Ada, MD;  Location: [place];  Service: Endoscopy;;  ? CHOLECYSTECTOMY    ? COLONOSCOPY WITH PROPOFOL N/A 02/03/2018  ? Procedure: COLONOSCOPY WITH PROPOFOL;  Surgeon: Lin Landsman, MD;  Location: [facility];  Service: Gastroenterology;  Laterality: N/A;  ? CORONARY ANGIOPLASTY  07/2015  ? STENT  ? CORONARY STENT INTERVENTION N/A 09/18/2016  ? Procedure: Coronary Stent Intervention;  Surgeon:  Troy Sine, MD;  Location: [place] [facility];  Service: Cardiovascular;  Laterality: N/A;  ? DIALYSIS/PERMA CATHETER INSERTION N/A 05/10/2018  ? Procedure: DIALYSIS/PERMA CATHETER INSERTION;  Surgeon: Katha Cabal, MD;  Location: [place] [facility];  Service: Cardiovascular;  Laterality: N/A;  ? DRESSING CHANGE UNDER ANESTHESIA Left 08/15/2017  ? Procedure: exploration of wound for bleeding;  Surgeon: Robert Bellow, MD;  Location: [facility];  Service: General;  Laterality: Left;  ? ESOPHAGOGASTRODUODENOSCOPY (EGD) WITH PROPOFOL N/A 02/03/2018  ? Procedure: ESOPHAGOGASTRODUODENOSCOPY (EGD) WITH PROPOFOL;  Surgeon: Lin Landsman, MD;  Location: [facility] END[facility]COPY;  Service: Gastroenterology;  Laterality: N/A;  ? ESOPHAGOGASTRODUODENOSCOPY (EGD) WITH PROPOFOL N/A 09/20/2020  ? Procedure: ESOPHAGOGASTRODUODENOSCOPY (EGD) WITH PROPOFOL;  Surgeon: Carol Ada, MD;  Location: [place];  Service: Endoscopy;  Laterality: N/A;  ? EYE SURGERY  11/17/2018  ? INCISION AND DRAINAGE ABSCESS Left 08/12/2017  ? Procedure: INCISION AND DRAINAGE ABSCESS;  Surgeon: Robert Bellow, MD;  Location: [facility];  Service: General;  Laterality: Left;  ? KNEE ARTHROSCOPY    ? LEFT HEART CATH N/A 09/18/2016  ? Procedure: Left Heart Cath;  Surgeon: Troy Sine, MD;  Location: [place] [facility];  Service: Cardiovascular;  Laterality: N/A;  ? LEFT HEART CATH AND CORONARY ANGIOGRAPHY N/A 09/16/2016  ? Procedure: Left Heart Cath and Coronary Angiography;  Surgeon: Burnell Blanks, MD;  Location: [place] [facility];  Service: Cardiovascular;  Laterality: N/A;  ? LEFT HEART CATH AND CORONARY ANGIOGRAPHY N/A 04/29/2017  ? Procedure: LEFT HEART CATH AND CORONARY ANGIOGRAPHY;  Surgeon: Nelva Bush, MD;  Location: [place] [facility];  Service: Cardiovascular;  Laterality: N/A;  ? LOWER EXTREMITY ANGIOGRAPHY Right 03/08/2018  ? Procedure: LOWER EXTREMITY ANGIOGRAPHY;  Surgeon: Katha Cabal, MD;  Location: [place] [facility];  Service: Cardiovascular;  Laterality: Right;  ? TUBAL LIGATION    ? TUBAL LIGATION    ? UPPER EXTREMITY ANGIOGRAPHY Right 09/19/2019  ? Procedure: UPPER EXTREMITY ANGIOGRAPHY;  Surgeon: Katha Cabal, MD;  Location: [place] [facility];  Service: Cardiovascular;  Laterality: Right;  ? WOUND DEBRIDEMENT Left 12/21/2018  ? Procedure: DEBRIDEMENT WOUND;  Surgeon: Katha Cabal, MD;  Location: [facility];  Service: Vascular;  Laterality: Left;  ? WOUND DEBRIDEMENT Left 12/30/2018  ? Procedure: DEBRIDEMENT WOUND WITH VAC PLACEMENT (LEFT UPPER EXTREMITY);  Surgeon: Katha Cabal, MD;  Location: [facility];  Service: Vascular;  Laterality: Left;  ? ? ?Social History  ? ?Socioeconomic History  ? Marital status: Divorced  ?  Spouse name: Not on file  ? Number of children: 3  ? Years of education: Not on file  ? Highest education level: Bachelor's degree (e.g., BA, AB, BS)  ?Occupational History  ? Occupation: disability  ?Tobacco Use  ? Smoking status: Former  ?  Packs/day: 0.25  ?  Years: 6.00  ?  Pack years: 1.50  ?  Types: Cigarettes  ?  Quit date: 10/25/1980  ?  Years since quitting: 41.0  ? Smokeless tobacco: Never  ?Vaping Use  ? Vaping Use: Never used  ?Substance and Sexual Activity  ? Alcohol use: Yes  ?  Comment: rare  ? Drug use: Not Currently  ?  Types: Marijuana  ? Sexual activity: Not on file  ?  Comment: Not asked  ?Other Topics Concern  ? Not on file  ?Social History Narrative  ? Not on file  ? ?Social Determinants of Health  ? ?Financial Resource Strain: Not on file  ?Food Insecurity: Not on file  ?Transportation Needs: Not on file  ?Physical Activity: Not on file  ?Stress: Not on file  ?Social Connections: Not on file  ?Intimate Partner Violence: Not on file  ? ? ?Current Outpatient Medications on File Prior to Visit  ?Medication Sig Dispense Refill  ? albuterol (VENTOLIN HFA) 108 (90 Base) MCG/ACT inhaler Inhale 2 puffs into the lungs every 4 (four) hours as needed for wheezing or  shortness of breath. 18 g 5  ? allopurinol (ZYLOPRIM) 100 MG tablet TAKE 1 TABLET BY MOUTH ONCE DAILY. (Patient taking differently: Take 100 mg by mouth daily.) 30 tablet 5  ? aspirin 81 MG EC tablet Take 1 table

## 2021-10-28 NOTE — Patient Instructions (Addendum)
Your blood pressure is high today.  Please see your primary care provider soon, to have it rechecked.   ?Please continue the same insulin.  It is important to make a big push to not miss any shots.   ?Please do a blood test in approx 2 weeks.  Based on the result, I hope to be able to clear you for surgery.   ?On this type of insulin schedule, you should eat meals on a regular schedule.  If a meal is missed or significantly delayed, your blood sugar could go low.   ?check your blood sugar twice a day.  vary the time of day when you check, between before the 3 meals, and at bedtime.  also check if you have symptoms of your blood sugar being too high or too low.  please keep a record of the readings and bring it to your next appointment here (or you can bring the meter itself).  You can write it on any piece of paper.  please call us sooner if your blood sugar goes below 70, or if most of your readings are over 200.   ?You should have an endocrinology follow-up appointment in 3 months.   ?

## 2021-11-12 ENCOUNTER — Other Ambulatory Visit: Payer: Medicare Other

## 2021-12-19 ENCOUNTER — Emergency Department (HOSPITAL_COMMUNITY)
Admission: EM | Admit: 2021-12-19 | Discharge: 2021-12-19 | Disposition: A | Discharge status: Home / Self Care | Payer: Medicare Other | Attending: Emergency Medicine | Admitting: Emergency Medicine

## 2021-12-19 ENCOUNTER — Emergency Department (HOSPITAL_COMMUNITY)
Admission: EM | Admit: 2021-12-19 | Discharge: 2021-12-20 | Disposition: A | Discharge status: Home / Self Care | Payer: Medicare Other | Source: Home / Self Care | Attending: Emergency Medicine | Admitting: Emergency Medicine

## 2021-12-19 ENCOUNTER — Other Ambulatory Visit: Payer: Self-pay

## 2021-12-19 ENCOUNTER — Encounter (HOSPITAL_COMMUNITY): Payer: Self-pay | Admitting: Emergency Medicine

## 2021-12-19 DIAGNOSIS — S71102A Unspecified open wound, left thigh, initial encounter: Secondary | ICD-10-CM | POA: Insufficient documentation

## 2021-12-19 DIAGNOSIS — I2510 Atherosclerotic heart disease of native coronary artery without angina pectoris: Secondary | ICD-10-CM | POA: Insufficient documentation

## 2021-12-19 DIAGNOSIS — Z5321 Procedure and treatment not carried out due to patient leaving prior to being seen by health care provider: Secondary | ICD-10-CM | POA: Insufficient documentation

## 2021-12-19 DIAGNOSIS — I252 Old myocardial infarction: Secondary | ICD-10-CM | POA: Insufficient documentation

## 2021-12-19 DIAGNOSIS — I120 Hypertensive chronic kidney disease with stage 5 chronic kidney disease or end stage renal disease: Secondary | ICD-10-CM | POA: Insufficient documentation

## 2021-12-19 DIAGNOSIS — Z992 Dependence on renal dialysis: Secondary | ICD-10-CM | POA: Insufficient documentation

## 2021-12-19 DIAGNOSIS — N186 End stage renal disease: Secondary | ICD-10-CM | POA: Diagnosis not present

## 2021-12-19 DIAGNOSIS — S71101A Unspecified open wound, right thigh, initial encounter: Secondary | ICD-10-CM | POA: Insufficient documentation

## 2021-12-19 DIAGNOSIS — Z87891 Personal history of nicotine dependence: Secondary | ICD-10-CM | POA: Insufficient documentation

## 2021-12-19 DIAGNOSIS — S81802A Unspecified open wound, left lower leg, initial encounter: Secondary | ICD-10-CM

## 2021-12-19 DIAGNOSIS — X58XXXD Exposure to other specified factors, subsequent encounter: Secondary | ICD-10-CM | POA: Insufficient documentation

## 2021-12-19 DIAGNOSIS — S81801A Unspecified open wound, right lower leg, initial encounter: Secondary | ICD-10-CM

## 2021-12-19 DIAGNOSIS — X58XXXA Exposure to other specified factors, initial encounter: Secondary | ICD-10-CM | POA: Insufficient documentation

## 2021-12-19 DIAGNOSIS — Z4800 Encounter for change or removal of nonsurgical wound dressing: Secondary | ICD-10-CM | POA: Diagnosis present

## 2021-12-19 DIAGNOSIS — E1122 Type 2 diabetes mellitus with diabetic chronic kidney disease: Secondary | ICD-10-CM | POA: Insufficient documentation

## 2021-12-19 DIAGNOSIS — J45909 Unspecified asthma, uncomplicated: Secondary | ICD-10-CM | POA: Insufficient documentation

## 2021-12-19 MED ORDER — HYDROMORPHONE HCL 1 MG/ML IJ SOLN
1.0000 mg | Freq: Once | INTRAMUSCULAR | Status: AC
  Administered 2021-12-20: 1 mg via INTRAMUSCULAR
  Filled 2021-12-19: qty 1

## 2021-12-19 NOTE — ED Triage Notes (Signed)
  Patient comes in for evaluation for several wounds on legs that started earlier this year.  Patient states she started itching after dialysis treatments and caused skin infections.  Her PCP put her on a cream but she states its not helping.  Wounds have started draining fluid per patient.  Was at [facility] waiting on a room, left after getting MSE in triage.  Pain 10/10, sharp/achy.

## 2021-12-19 NOTE — ED Provider Triage Note (Signed)
Emergency Medicine Provider Triage Evaluation Note  [name] , a 66 y.o. female  was evaluated in triage.  Pt complains of recurrent wounds to her extremities and back.  Has been seen by her PCP and they are not doing anything she says.  She sometimes is given antibiotics which makes them better but they keep coming back.  Says they were very painful.  Review of Systems  Positive: Wounds Negative: Fever, chills, night sweats  Physical Exam  BP (!) 148/71 (BP Location: Right Arm)   Pulse 79   Temp 98.7 F (37.1 C) (Oral)   Resp 18   SpO2 95%  Gen:   Awake, no distress   Resp:  Normal effort  MSK:   Moves extremities without difficulty  Other:  Patient with sore to the posterior left knee, some skin breakdown with drainage.  Reports having another one in her right thigh/buttocks area but not undressed in triage  Medical Decision Making  Medically screening exam initiated at 7:57 PM.  Appropriate orders placed.  [name] was informed that the remainder of the evaluation will be completed by another provider, this initial triage assessment does not replace that evaluation, and the importance of remaining in the ED until their evaluation is complete.    Skin breakdown in the skin folds.  Some drainage.  No systemic symptoms, no labs ordered.  May just need wound care   [name], PA-C 12/19/21 1959

## 2021-12-19 NOTE — ED Triage Notes (Signed)
Pt reported to ED for evaluation of several wounds on upper thighs to BLE. Pt states that since she has started dialysis over 1 year ago, she has been "itchy" and scratches her skin. Wounds to thighs have developed and drained purulent discharge. Pt states she has followed up with PCP with no interventions given.

## 2021-12-20 DIAGNOSIS — Z4800 Encounter for change or removal of nonsurgical wound dressing: Secondary | ICD-10-CM | POA: Diagnosis not present

## 2021-12-20 LAB — COMPREHENSIVE METABOLIC PANEL
ALT: 48 U/L — ABNORMAL HIGH (ref 0–44)
AST: 72 U/L — ABNORMAL HIGH (ref 15–41)
Albumin: 3.1 g/dL — ABNORMAL LOW (ref 3.5–5.0)
Alkaline Phosphatase: 281 U/L — ABNORMAL HIGH (ref 38–126)
Anion gap: 12 (ref 5–15)
BUN: 29 mg/dL — ABNORMAL HIGH (ref 8–23)
CO2: 32 mmol/L (ref 22–32)
Calcium: 8 mg/dL — ABNORMAL LOW (ref 8.9–10.3)
Chloride: 97 mmol/L — ABNORMAL LOW (ref 98–111)
Creatinine, Ser: 5.6 mg/dL — ABNORMAL HIGH (ref 0.44–1.00)
GFR, Estimated: 8 mL/min — ABNORMAL LOW (ref >60)
Glucose, Bld: 267 mg/dL — ABNORMAL HIGH (ref 70–99)
Potassium: 3.7 mmol/L (ref 3.5–5.1)
Sodium: 141 mmol/L (ref 135–145)
Total Bilirubin: 0.4 mg/dL (ref 0.3–1.2)
Total Protein: 7.8 g/dL (ref 6.5–8.1)

## 2021-12-20 LAB — CBC
HCT: 33.4 % — ABNORMAL LOW (ref 36.0–46.0)
Hemoglobin: 10.4 g/dL — ABNORMAL LOW (ref 12.0–15.0)
MCH: 29.4 pg (ref 26.0–34.0)
MCHC: 31.1 g/dL (ref 30.0–36.0)
MCV: 94.4 fL (ref 80.0–100.0)
Platelets: 199 10*3/uL (ref 150–400)
RBC: 3.54 MIL/uL — ABNORMAL LOW (ref 3.87–5.11)
RDW: 16.9 % — ABNORMAL HIGH (ref 11.5–15.5)
WBC: 8.6 10*3/uL (ref 4.0–10.5)
nRBC: 0 % (ref 0.0–0.2)

## 2021-12-20 LAB — MAGNESIUM: Magnesium: 2.2 mg/dL (ref 1.7–2.4)

## 2021-12-20 LAB — PHOSPHORUS: Phosphorus: 4.4 mg/dL (ref 2.5–4.6)

## 2021-12-20 NOTE — Discharge Instructions (Addendum)
You were evaluated in the Emergency Department and after careful evaluation, we did not find any emergent condition requiring admission or further testing in the hospital.  Your exam/testing today was overall reassuring.  Important that you reach out to your nephrologist to discuss your wound, they may be able to help treat it.  Also recommend following up with the wound care center.  Please return to the Emergency Department if you experience any worsening of your condition.  Thank you for allowing [place] to be a part of your care.

## 2021-12-20 NOTE — ED Provider Notes (Signed)
[facility] Emergency Department Provider Note MRN:  [id number]  Arrival date & time: 12/20/21     Chief Complaint   Wound Check   History of Present Illness   [name] is a 66 y.o. year-old female with a history of CAD, ESRD, diabetes presenting to the ED with chief complaint of wound check.  Worsening painful wounds to the lower extremities over the past few months, getting extremely painful, unable to sit comfortably during dialysis, unable to sleep this evening.  Has not seen in wound expert.  No fever, had dialysis today, no other complaints.  Reports that she had abdominal surgery for colostomy reversal a few weeks ago as well.  Review of Systems  A thorough review of systems was obtained and all systems are negative except as noted in the HPI and PMH.   Patient's Health History    Past Medical History:  Diagnosis Date   Anemia    Aortic stenosis    Echo 8/18: mean 13, peak 28, LVOT/AV mean velocity 0.51   Arthritis    Asthma    As a child    Bronchitis    CAD (coronary artery disease)    a. 09/2016: 50% Ost 1st Mrg stenosis, 50% 2nd Mrg stenosis, 20% Mid-Cx, 95% Prox LAD, 40% mid-LAD, and 10% dist-LAD stenosis. Staged PCI with DES to Prox-LAD.    Chronic combined systolic and diastolic CHF (congestive heart failure) ([place]) 2011   echo 2/18: EF 55-60, normal wall motion, grade 2 diastolic dysfunction, trivial AI // echo 3/18: Septal and apical HK, EF 45-50, normal wall motion, trivial AI, mild LAE, PASP 38 // echo 8/18: EF 60-65, normal wall motion, grade 1 diastolic dysfunction, calcified aortic valve leaflets, mild aortic stenosis (mean 13, peak 28, LVOT/AV mean velocity 0.51), mild AI, moderate MAC, mild LAE, trivial TR    Chronic kidney disease    STAGE 4   Chronic kidney disease on chronic dialysis (HCC)    t, th, sat   Complication of anesthesia    Depression    Diabetes mellitus Dx 1989   Elevated lipids    GERD (gastroesophageal  reflux disease)    Gout    Heart murmur    asymptomatic   Hepatitis C Dx 2013   Hypertension Dx 1989   Infected surgical wound    Lt arm   Myocardial infarction ([place]) 07/2015   Obesity    Pancreatitis 2013   Pneumonia    Refusal of blood transfusions as patient is Jehovah's Witness    pt states she is not [place] witness and does not refuse blood products   Tendinitis    Tremors of nervous system    LEFT HAND   Ulcer 2010    Past Surgical History:  Procedure Laterality Date   A/V FISTULAGRAM Left 04/11/2019   Procedure: A/V FISTULAGRAM;  Surgeon: Katha Cabal, MD;  Location: [place] [facility];  Service: Cardiovascular;  Laterality: Left;   A/V FISTULAGRAM Left 06/02/2019   Procedure: A/V FISTULAGRAM;  Surgeon: Katha Cabal, MD;  Location: [place] [facility];  Service: Cardiovascular;  Laterality: Left;   APPLICATION OF WOUND VAC Left 08/14/2017   Procedure: APPLICATION OF WOUND VAC Exchange;  Surgeon: Robert Bellow, MD;  Location: [facility];  Service: General;  Laterality: Left;   APPLICATION OF WOUND VAC Left 12/21/2018   Procedure: APPLICATION OF WOUND VAC;  Surgeon: Katha Cabal, MD;  Location: [facility];  Service: Vascular;  Laterality: Left;  AV FISTULA PLACEMENT Left 08/19/2018   Procedure: ARTERIOVENOUS (AV) FISTULA CREATION ( BRACHIOBASILIC );  Surgeon: Katha Cabal, MD;  Location: [facility];  Service: Vascular;  Laterality: Left;   BASCILIC VEIN TRANSPOSITION Left 11/18/2018   Procedure: BASCILIC VEIN TRANSPOSITION;  Surgeon: Katha Cabal, MD;  Location: [facility];  Service: Vascular;  Laterality: Left;   BIOPSY  09/20/2020   Procedure: BIOPSY;  Surgeon: Carol Ada, MD;  Location: [place];  Service: Endoscopy;;   CHOLECYSTECTOMY     COLONOSCOPY WITH PROPOFOL N/A 02/03/2018   Procedure: COLONOSCOPY WITH PROPOFOL;  Surgeon: Lin Landsman, MD;  Location: [facility];  Service: Gastroenterology;  Laterality: N/A;   CORONARY  ANGIOPLASTY  07/2015   STENT   CORONARY STENT INTERVENTION N/A 09/18/2016   Procedure: Coronary Stent Intervention;  Surgeon: Troy Sine, MD;  Location: [place] [facility];  Service: Cardiovascular;  Laterality: N/A;   DIALYSIS/PERMA CATHETER INSERTION N/A 05/10/2018   Procedure: DIALYSIS/PERMA CATHETER INSERTION;  Surgeon: Katha Cabal, MD;  Location: [place] [facility];  Service: Cardiovascular;  Laterality: N/A;   DRESSING CHANGE UNDER ANESTHESIA Left 08/15/2017   Procedure: exploration of wound for bleeding;  Surgeon: Robert Bellow, MD;  Location: [facility];  Service: General;  Laterality: Left;   ESOPHAGOGASTRODUODENOSCOPY (EGD) WITH PROPOFOL N/A 02/03/2018   Procedure: ESOPHAGOGASTRODUODENOSCOPY (EGD) WITH PROPOFOL;  Surgeon: Lin Landsman, MD;  Location: [facility];  Service: Gastroenterology;  Laterality: N/A;   ESOPHAGOGASTRODUODENOSCOPY (EGD) WITH PROPOFOL N/A 09/20/2020   Procedure: ESOPHAGOGASTRODUODENOSCOPY (EGD) WITH PROPOFOL;  Surgeon: Carol Ada, MD;  Location: [place];  Service: Endoscopy;  Laterality: N/A;   EYE SURGERY  11/17/2018   INCISION AND DRAINAGE ABSCESS Left 08/12/2017   Procedure: INCISION AND DRAINAGE ABSCESS;  Surgeon: Robert Bellow, MD;  Location: [facility];  Service: General;  Laterality: Left;   KNEE ARTHROSCOPY     LEFT HEART CATH N/A 09/18/2016   Procedure: Left Heart Cath;  Surgeon: Troy Sine, MD;  Location: [place] [facility];  Service: Cardiovascular;  Laterality: N/A;   LEFT HEART CATH AND CORONARY ANGIOGRAPHY N/A 09/16/2016   Procedure: Left Heart Cath and Coronary Angiography;  Surgeon: Burnell Blanks, MD;  Location: [place] [facility];  Service: Cardiovascular;  Laterality: N/A;   LEFT HEART CATH AND CORONARY ANGIOGRAPHY N/A 04/29/2017   Procedure: LEFT HEART CATH AND CORONARY ANGIOGRAPHY;  Surgeon: Nelva Bush, MD;  Location: [place] [facility];  Service: Cardiovascular;  Laterality: N/A;   LOWER EXTREMITY  ANGIOGRAPHY Right 03/08/2018   Procedure: LOWER EXTREMITY ANGIOGRAPHY;  Surgeon: Katha Cabal, MD;  Location: [place] [facility];  Service: Cardiovascular;  Laterality: Right;   TUBAL LIGATION     TUBAL LIGATION     UPPER EXTREMITY ANGIOGRAPHY Right 09/19/2019   Procedure: UPPER EXTREMITY ANGIOGRAPHY;  Surgeon: Katha Cabal, MD;  Location: [place] [facility];  Service: Cardiovascular;  Laterality: Right;   WOUND DEBRIDEMENT Left 12/21/2018   Procedure: DEBRIDEMENT WOUND;  Surgeon: Katha Cabal, MD;  Location: [facility];  Service: Vascular;  Laterality: Left;   WOUND DEBRIDEMENT Left 12/30/2018   Procedure: DEBRIDEMENT WOUND WITH VAC PLACEMENT (LEFT UPPER EXTREMITY);  Surgeon: Katha Cabal, MD;  Location: [facility];  Service: Vascular;  Laterality: Left;    Family History  Problem Relation Age of Onset   Colon cancer Mother    Heart attack Other    Heart attack Maternal Grandmother    Hypertension Sister    Hypertension  Brother    Diabetes Paternal Grandmother    Breast cancer Neg Hx     Social History   Socioeconomic History   Marital status: Divorced    Spouse name: Not on file   Number of children: 3   Years of education: Not on file   Highest education level: Bachelor's degree (e.g., BA, AB, BS)  Occupational History   Occupation: disability  Tobacco Use   Smoking status: Former    Packs/day: 0.25    Years: 6.00    Total pack years: 1.50    Types: Cigarettes    Quit date: 10/25/1980    Years since quitting: 41.1   Smokeless tobacco: Never  Vaping Use   Vaping Use: Never used  Substance and Sexual Activity   Alcohol use: Yes    Comment: rare   Drug use: Not Currently    Types: Marijuana   Sexual activity: Not on file    Comment: Not asked  Other Topics Concern   Not on file  Social History Narrative   Not on file   Social Determinants of Health   Financial Resource Strain: Low Risk  (12/26/2019)   Overall Financial Resource Strain (CARDIA)     Difficulty of Paying Living Expenses: Not hard at all  Food Insecurity: No Food Insecurity (12/26/2019)   Hunger Vital Sign    Worried About Running Out of Food in the Last Year: Never true    Ran Out of Food in the Last Year: Never true  Transportation Needs: No Transportation Needs (12/26/2019)   PRAPARE - Hydrologist (Medical): No    Lack of Transportation (Non-Medical): No  Physical Activity: Inactive (12/26/2019)   Exercise Vital Sign    Days of Exercise per Week: 0 days    Minutes of Exercise per Session: 0 min  Stress: Stress Concern Present (12/26/2019)   [place]    Feeling of Stress : To some extent  Social Connections: Socially Isolated (12/26/2019)   Social Connection and Isolation Panel [NHANES]    Frequency of Communication with Friends and Family: Twice a week    Frequency of Social Gatherings with Friends and Family: Never    Attends Religious Services: 1 to 4 times per year    Active Member of [employer] or Organizations: No    Attends Archivist Meetings: Never    Marital Status: Divorced  Human resources officer Violence: Not At Risk (12/26/2019)   Humiliation, Afraid, Rape, and Kick questionnaire    Fear of Current or Ex-Partner: No    Emotionally Abused: No    Physically Abused: No    Sexually Abused: No     Physical Exam   Vitals:   12/20/21 0100 12/20/21 0130  BP: (!) 165/73 (!) 156/64  Pulse: 73 74  Resp: 18 16  Temp:    SpO2: 97% 95%    CONSTITUTIONAL: Chronically ill-appearing, NAD NEURO/PSYCH:  Alert and oriented x 3, no focal deficits EYES:  eyes equal and reactive ENT/NECK:  no LAD, no JVD CARDIO: Regular rate, well-perfused, normal S1 and S2 PULM:  CTAB no wheezing or rhonchi GI/GU:  non-distended, non-tender MSK/SPINE:  No gross deformities, no edema SKIN: Wounds to the right lateral thigh, left medial thigh with hyperpigmented border,  ulcerative center, tender to palpation, no surrounding erythema or warmth   *Additional and/or pertinent findings included in MDM below  Diagnostic and Interventional Summary    EKG Interpretation  Date/Time:  Ventricular Rate:    PR Interval:    QRS Duration:   QT Interval:    QTC Calculation:   R Axis:     Text Interpretation:         Labs Reviewed  CBC - Abnormal; Notable for the following components:      Result Value   RBC 3.54 (*)    Hemoglobin 10.4 (*)    HCT 33.4 (*)    RDW 16.9 (*)    All other components within normal limits  COMPREHENSIVE METABOLIC PANEL - Abnormal; Notable for the following components:   Chloride 97 (*)    Glucose, Bld 267 (*)    BUN 29 (*)    Creatinine, Ser 5.60 (*)    Calcium 8.0 (*)    Albumin 3.1 (*)    AST 72 (*)    ALT 48 (*)    Alkaline Phosphatase 281 (*)    GFR, Estimated 8 (*)    All other components within normal limits  MAGNESIUM  PHOSPHORUS    No orders to display    Medications  HYDROmorphone (DILAUDID) injection 1 mg (1 mg Intramuscular Given 12/20/21 0048)     Procedures  /  Critical Care Procedures  ED Course and Medical Decision Making  Initial Impression and Ddx Exam showing chronic wounds, given the appearance and patient's ESRD history there is consideration for calciphylaxis.  Does not seem to be any sign of infection, vital signs normal, abdominal wounds with dressings in place, no signs of infection or tenderness there either.  Obtaining screening labs for electrolytes, phosphorus, pain control, will reassess.  Anticipating she can follow-up in the office with either nephrology or wound care.  Past medical/surgical history that increases complexity of ED encounter: ESRD  Interpretation of Diagnostics I personally reviewed the laboratory evaluation and my interpretation is as follows: No significant blood count or electrolyte disturbance, creatinine at or near recent baseline    Patient Reassessment  and Ultimate Disposition/Management     Patient feeling better, has not spiked any fever, no indication for further testing or admission in the emergency department or hospital, advised follow-up.  Patient management required discussion with the following services or consulting groups:  None  Complexity of Problems Addressed Acute illness or injury that poses threat of life of bodily function  Additional Data Reviewed and Analyzed Further history obtained from: None  Additional Factors Impacting ED Encounter Risk Consideration of hospitalization  Barth Kirks. [place] Small, [place] [email or web address]  Final Clinical Impressions(s) / ED Diagnoses     ICD-10-CM   1. Wound of left lower extremity, initial encounter  S81.802A     2. Wound of right lower extremity, initial encounter  S81.801A       ED Discharge Orders          Ordered    AMB referral to wound care center        12/20/21 0232             Discharge Instructions Discussed with and Provided to Patient:     Discharge Instructions      You were evaluated in the Emergency Department and after careful evaluation, we did not find any emergent condition requiring admission or further testing in the hospital.  Your exam/testing today was overall reassuring.  Important that you reach out to your nephrologist to discuss your wound, they may be able to help treat it.  Also recommend following up with the wound  care center.  Please return to the Emergency Department if you experience any worsening of your condition.  Thank you for allowing [place] to be a part of your care.        Maudie Flakes, MD 12/20/21 [phone]

## 2021-12-29 ENCOUNTER — Encounter (HOSPITAL_BASED_OUTPATIENT_CLINIC_OR_DEPARTMENT_OTHER): Payer: Medicare Other | Attending: Internal Medicine | Admitting: Internal Medicine

## 2021-12-29 DIAGNOSIS — E1122 Type 2 diabetes mellitus with diabetic chronic kidney disease: Secondary | ICD-10-CM | POA: Insufficient documentation

## 2021-12-29 DIAGNOSIS — N186 End stage renal disease: Secondary | ICD-10-CM | POA: Diagnosis not present

## 2021-12-29 DIAGNOSIS — I132 Hypertensive heart and chronic kidney disease with heart failure and with stage 5 chronic kidney disease, or end stage renal disease: Secondary | ICD-10-CM | POA: Insufficient documentation

## 2021-12-29 DIAGNOSIS — L97122 Non-pressure chronic ulcer of left thigh with fat layer exposed: Secondary | ICD-10-CM | POA: Insufficient documentation

## 2021-12-29 DIAGNOSIS — Z992 Dependence on renal dialysis: Secondary | ICD-10-CM | POA: Diagnosis not present

## 2021-12-29 DIAGNOSIS — L97112 Non-pressure chronic ulcer of right thigh with fat layer exposed: Secondary | ICD-10-CM | POA: Insufficient documentation

## 2021-12-29 DIAGNOSIS — G4733 Obstructive sleep apnea (adult) (pediatric): Secondary | ICD-10-CM | POA: Diagnosis not present

## 2021-12-29 NOTE — Progress Notes (Signed)
REXINE, GOWENS ([id number]) Visit Report for 12/29/2021 Chief Complaint Document Details Patient Name: Date of Service: [name]. 12/29/2021 8:00 A M Medical Record Number: [id number] Patient Account Number: [id number] Date of Birth/Sex: Treating RN: 1956-05-23 (66 y.o. [name] Primary Care Provider: [facility] [place] Other Clinician: Referring Provider: Treating Provider/Extender: Guy Sandifer, MICHA EL Weeks in Treatment: 0 Information Obtained from: Patient Chief Complaint 12/29/2021; bilateral thigh wounds Electronic Signature(s) Signed: 12/29/2021 9:21:00 AM By: Kalman Shan DO Entered By: Kalman Shan on 12/29/2021 09:12:03 -------------------------------------------------------------------------------- Debridement Details Patient Name: Date of Service: [name]. 12/29/2021 8:00 A M Medical Record Number: [id number] Patient Account Number: [id number] Date of Birth/Sex: Treating RN: 05-Feb-1956 (66 y.o. [name] Primary Care Provider: [facility] [place] Other Clinician: Referring Provider: Treating Provider/Extender: Guy Sandifer, MICHA EL Weeks in Treatment: 0 Debridement Performed for Assessment: Wound #1 Right,Proximal,Lateral Upper Leg Performed By: Physician Kalman Shan, DO Debridement Type: Debridement Level of Consciousness (Pre-procedure): Awake and [name] Pre-procedure Verification/Time Out Yes - 08:48 Taken: Start Time: 08:49 Pain Control: Lidocaine 5% topical ointment T Area Debrided (L x W): otal 2 (cm) x 2.5 (cm) = 5 (cm) Tissue and other material debrided: Viable, Non-Viable, Slough, Subcutaneous, Skin: Dermis , Skin: Epidermis, Fibrin/Exudate, Slough Level: Skin/Subcutaneous Tissue Debridement Description: Excisional Instrument: Curette Bleeding: Minimum Hemostasis Achieved: Pressure End Time: 08:55 Procedural Pain: 0 Post Procedural Pain: 0 Response to Treatment: Procedure was tolerated  well Level of Consciousness (Post- Awake and [name] procedure): Post Debridement Measurements of Total Wound Length: (cm) 2 Width: (cm) 2.5 Depth: (cm) 0.2 Volume: (cm) 0.785 Character of Wound/Ulcer Post Debridement: Improved Post Procedure Diagnosis Same as Pre-procedure Electronic Signature(s) Signed: 12/29/2021 9:21:00 AM By: Kalman Shan DO Signed: 12/29/2021 5:44:49 PM By: Deon Pilling RN, BSN Entered By: Deon Pilling on 12/29/2021 08:56:04 -------------------------------------------------------------------------------- Debridement Details Patient Name: Date of Service: Heather Pih M. 12/29/2021 8:00 A M Medical Record Number: [id number] Patient Account Number: [id number] Date of Birth/Sex: Treating RN: June 30, 1956 (66 y.o. [name] Primary Care Provider: [facility] [place] Other Clinician: Referring Provider: Treating Provider/Extender: Guy Sandifer, MICHA EL Weeks in Treatment: 0 Debridement Performed for Assessment: Wound #2 Right,Distal,Lateral Upper Leg Performed By: Physician Kalman Shan, DO Debridement Type: Debridement Level of Consciousness (Pre-procedure): Awake and [name] Pre-procedure Verification/Time Out Yes - 08:48 Taken: Start Time: 08:49 Pain Control: Lidocaine 5% topical ointment T Area Debrided (L x W): otal 1 (cm) x 1.1 (cm) = 1.1 (cm) Tissue and other material debrided: Viable, Non-Viable, Slough, Subcutaneous, Skin: Dermis , Skin: Epidermis, Fibrin/Exudate, Slough Level: Skin/Subcutaneous Tissue Debridement Description: Excisional Instrument: Curette Bleeding: Minimum Hemostasis Achieved: Pressure End Time: 08:55 Procedural Pain: 0 Post Procedural Pain: 0 Response to Treatment: Procedure was tolerated well Level of Consciousness (Post- Awake and [name] procedure): Post Debridement Measurements of Total Wound Length: (cm) 1 Width: (cm) 1.1 Depth: (cm) 0.1 Volume: (cm) 0.086 Character of Wound/Ulcer Post  Debridement: Improved Post Procedure Diagnosis Same as Pre-procedure Electronic Signature(s) Signed: 12/29/2021 9:21:00 AM By: Kalman Shan DO Signed: 12/29/2021 5:44:49 PM By: Deon Pilling RN, BSN Entered By: Deon Pilling on 12/29/2021 08:56:19 -------------------------------------------------------------------------------- Debridement Details Patient Name: Date of Service: Heather Pih M. 12/29/2021 8:00 A M Medical Record Number: [id number] Patient Account Number: [id number] Date of Birth/Sex: Treating RN: 27-Feb-1956 (66 y.o. [name] Primary Care Provider: [facility] [place] Other Clinician: Referring Provider: Treating Provider/Extender: Guy Sandifer, MICHA EL Weeks in Treatment: 0 Debridement Performed for Assessment: Wound #3  Left,Medial,Posterior Upper Leg Performed By: Physician Kalman Shan, DO Debridement Type: Debridement Level of Consciousness (Pre-procedure): Awake and [name] Pre-procedure Verification/Time Out Yes - 08:48 Taken: Start Time: 08:49 Pain Control: Lidocaine 5% topical ointment T Area Debrided (L x W): otal 2 (cm) x 1 (cm) = 2 (cm) Tissue and other material debrided: Viable, Non-Viable, Slough, Subcutaneous, Skin: Dermis , Skin: Epidermis, Fibrin/Exudate, Slough Level: Skin/Subcutaneous Tissue Debridement Description: Excisional Instrument: Curette Bleeding: Minimum Hemostasis Achieved: Pressure End Time: 08:55 Procedural Pain: 0 Post Procedural Pain: 0 Response to Treatment: Procedure was tolerated well Level of Consciousness (Post- Awake and [name] procedure): Post Debridement Measurements of Total Wound Length: (cm) 2 Width: (cm) 1 Depth: (cm) 0.2 Volume: (cm) 0.314 Character of Wound/Ulcer Post Debridement: Improved Post Procedure Diagnosis Same as Pre-procedure Electronic Signature(s) Signed: 12/29/2021 9:21:00 AM By: Kalman Shan DO Signed: 12/29/2021 5:44:49 PM By: Deon Pilling RN, BSN Entered By: Deon Pilling on 12/29/2021 08:56:34 -------------------------------------------------------------------------------- HPI Details Patient Name: Date of Service: Heather Pih M. 12/29/2021 8:00 A M Medical Record Number: [id number] Patient Account Number: [id number] Date of Birth/Sex: Treating RN: 1956-06-04 (66 y.o. [name] Primary Care Provider: [place] Other Clinician: Referring Provider: Treating Provider/Extender: Guy Sandifer, MICHA EL Weeks in Treatment: 0 History of Present Illness HPI Description: Admission 12/29/2021 [name] is a 66 year old female with a past medical history of uncontrolled type 2 diabetes on hemodialysis, OSA and chronic hep C that presents to the clinic for a 14-monthhistory of waxing and waning of ulcers to her lower extremities bilaterally. She states that her nephrologist has seen these wounds. She is not currently on sodium thiosulfate. She reports using Neosporin and a wound cleanser daily to the wound beds. She currently denies systemic signs of infection. Electronic Signature(s) Signed: 12/29/2021 9:21:00 AM By: HKalman ShanDO Entered By: HKalman Shanon 12/29/2021 09:17:18 -------------------------------------------------------------------------------- Physical Exam Details Patient Name: Date of Service: RUrsula [name] 12/29/2021 8:00 A M Medical Record Number: [id number] Account Number: [id number] of Birth/Sex: Treating RN: [birth year](66y.o. FBenjaman LobePrimary Care Provider: [facility] [place] Clinician: Referring Provider: Treating Provider/Extender: HGuy Sandifer MICHA EL Weeks in Treatment: 0 Constitutional respirations regular, non-labored and within target range for patient.. Cardiovascular 2+ dorsalis pedis/posterior tibialis pulses. Psychiatric pleasant and cooperative. Notes Right thigh: T the lateral aspect there are 2 open wounds with nonviable tissue  throughout the surface. No surrounding signs of infection. o Left thigh: T the inner aspect there is an open wound again with nonviable tissue throughout. No surrounding signs of infection. o Electronic Signature(s) Signed: 12/29/2021 9:21:00 AM By: HKalman ShanDO Entered By: HKalman Shanon 12/29/2021 09:17:57 -------------------------------------------------------------------------------- Physician Orders Details Patient Name: Date of Service: RUrsula [name] 12/29/2021 8:00 A M Medical Record Number: [id number] Account Number: [id number] of Birth/Sex: Treating RN: 9Apr 17, 1957(66y.o. FDebby BudPrimary Care Provider: [facility], [place] Clinician: Referring Provider: Treating Provider/Extender: HGuy Sandifer MICHA EL Weeks in Treatment: 0 Verbal / Phone Orders: No Diagnosis Coding ICD-10 Coding Code Description LI96.789Non-pressure chronic ulcer of right thigh with fat layer exposed L97.122 Non-pressure chronic ulcer of left thigh with fat layer exposed N18.6 End stage renal disease E83.59 Other disorders of calcium metabolism Follow-up Appointments ppointment in 1 week. - Dr. HHeber [place]and LAllayne Butcher Room 9 01/05/2022 1000 Return A Other: - [facility] Bathing/ Shower/ Hygiene May shower and wash wound with soap and water. -  with dressing changes. Wound Treatment Wound #1 - Upper Leg Wound Laterality: Right, Lateral, Proximal Cleanser: Soap and Water 1 x Per Day/30 Days Discharge Instructions: May shower and wash wound with dial antibacterial soap and water prior to dressing change. Cleanser: Wound Cleanser 1 x Per Day/30 Days Discharge Instructions: Cleanse the wound with wound cleanser prior to applying a clean dressing using gauze sponges, not tissue or cotton balls. Peri-Wound Care: Skin Prep 1 x Per Day/30 Days Discharge Instructions: Use skin prep as directed Prim Dressing: MediHoney Gel, tube 1.5 (oz) 1 x Per  Day/30 Days ary Discharge Instructions: Apply to wound bed as instructed Secondary Dressing: Zetuvit Plus Silicone Border Dressing 4x4 (in/in) 1 x Per Day/30 Days Discharge Instructions: Apply silicone border over primary dressing as directed. Wound #2 - Upper Leg Wound Laterality: Right, Lateral, Distal Cleanser: Soap and Water 1 x Per Day/30 Days Discharge Instructions: May shower and wash wound with dial antibacterial soap and water prior to dressing change. Cleanser: Wound Cleanser 1 x Per Day/30 Days Discharge Instructions: Cleanse the wound with wound cleanser prior to applying a clean dressing using gauze sponges, not tissue or cotton balls. Peri-Wound Care: Skin Prep 1 x Per Day/30 Days Discharge Instructions: Use skin prep as directed Prim Dressing: MediHoney Gel, tube 1.5 (oz) 1 x Per Day/30 Days ary Discharge Instructions: Apply to wound bed as instructed Secondary Dressing: Zetuvit Plus Silicone Border Dressing 4x4 (in/in) 1 x Per Day/30 Days Discharge Instructions: Apply silicone border over primary dressing as directed. Wound #3 - Upper Leg Wound Laterality: Left, Medial, Posterior Cleanser: Soap and Water 1 x Per Day/30 Days Discharge Instructions: May shower and wash wound with dial antibacterial soap and water prior to dressing change. Cleanser: Wound Cleanser 1 x Per Day/30 Days Discharge Instructions: Cleanse the wound with wound cleanser prior to applying a clean dressing using gauze sponges, not tissue or cotton balls. Peri-Wound Care: Skin Prep 1 x Per Day/30 Days Discharge Instructions: Use skin prep as directed Prim Dressing: MediHoney Gel, tube 1.5 (oz) 1 x Per Day/30 Days ary Discharge Instructions: Apply to wound bed as instructed Secondary Dressing: Zetuvit Plus Silicone Border Dressing 4x4 (in/in) 1 x Per Day/30 Days Discharge Instructions: Apply silicone border over primary dressing as directed. Electronic Signature(s) Signed: 12/29/2021 9:21:00 AM By:  Kalman Shan DO Entered By: Kalman Shan on 12/29/2021 09:18:13 -------------------------------------------------------------------------------- Problem List Details Patient Name: Date of Service: [name]. 12/29/2021 8:00 A M Medical Record Number: [id number] Patient Account Number: [id number] Date of Birth/Sex: Treating RN: May 01, 1956 (66 y.o. [name] Primary Care Provider: [facility] [place] Other Clinician: Referring Provider: Treating Provider/Extender: Guy Sandifer, MICHA EL Weeks in Treatment: 0 Active Problems ICD-10 Encounter Code Description Active Date MDM Diagnosis L97.112 Non-pressure chronic ulcer of right thigh with fat layer exposed 12/29/2021 No Yes L97.122 Non-pressure chronic ulcer of left thigh with fat layer exposed 12/29/2021 No Yes N18.6 End stage renal disease 12/29/2021 No Yes E83.59 Other disorders of calcium metabolism 12/29/2021 No Yes Inactive Problems Resolved Problems Electronic Signature(s) Signed: 12/29/2021 9:21:00 AM By: Kalman Shan DO Entered By: Kalman Shan on 12/29/2021 09:11:43 -------------------------------------------------------------------------------- Progress Note Details Patient Name: Date of Service: Heather Pih M. 12/29/2021 8:00 A M Medical Record Number: [id number] Patient Account Number: [id number] Date of Birth/Sex: Treating RN: [birth year] (66 y.o. [name] Primary Care Provider: [facility] [place] Other Clinician: Referring Provider: Treating Provider/Extender: Guy Sandifer, MICHA EL Weeks in Treatment: 0 Subjective Chief Complaint Information obtained  from Patient 12/29/2021; bilateral thigh wounds History of Present Illness (HPI) Admission 12/29/2021 [name] is a 66 year old female with a past medical history of uncontrolled type 2 diabetes on hemodialysis, OSA and chronic hep C that presents to the clinic for a 10-monthhistory of waxing and waning of  ulcers to her lower extremities bilaterally. She states that her nephrologist has seen these wounds. She is not currently on sodium thiosulfate. She reports using Neosporin and a wound cleanser daily to the wound beds. She currently denies systemic signs of infection. Patient History Information obtained from Chart. Allergies morphine (Reaction: itching), Shellfish Containing Products (Reaction: anaphylaxis) Family History Cancer - Mother, Diabetes - Paternal Grandparents,Siblings, Heart Disease - Maternal Grandparents, Hypertension - Siblings, Seizures - Child, No family history of Kidney Disease, Lung Disease, Stroke, Thyroid Problems, Tuberculosis. Social History Former smoker - quit 1982, Marital Status - Divorced, Alcohol Use - Never, Drug Use - Prior History, Caffeine Use - Rarely. Medical History Eyes Denies history of Cataracts, Glaucoma, Optic Neuritis Hematologic/Lymphatic Patient has history of Anemia Denies history of Hemophilia, Human Immunodeficiency Virus, Lymphedema, Sickle Cell Disease Respiratory Patient has history of Asthma Denies history of Aspiration, Chronic Obstructive Pulmonary Disease (COPD), Pneumothorax, Sleep Apnea, Tuberculosis Cardiovascular Patient has history of Congestive Heart Failure - EF 55-60%, Coronary Artery Disease, Hypertension, Myocardial Infarction Denies history of Angina, Arrhythmia, Deep Vein Thrombosis, Hypotension, Peripheral Arterial Disease, Peripheral Venous Disease, Phlebitis, Vasculitis Gastrointestinal Patient has history of Hepatitis C Denies history of Cirrhosis , Colitis, Crohnoos, Hepatitis A, Hepatitis B Endocrine Patient has history of Type II Diabetes Denies history of Type I Diabetes Genitourinary Patient has history of End Stage Renal Disease - stage IV-dialysis Integumentary (Skin) Denies history of History of Burn Musculoskeletal Patient has history of Gout, Osteoarthritis Denies history of Rheumatoid Arthritis,  Osteomyelitis Neurologic Patient has history of Neuropathy Denies history of Dementia, Quadriplegia, Paraplegia, Seizure Disorder Patient is treated with Insulin. Blood sugar is tested. Medical A Surgical History Notes nd Cardiovascular aortic stenosis Review of Systems (ROS) Constitutional Symptoms (General Health) Denies complaints or symptoms of Fatigue, Fever, Chills, Marked Weight Change. Eyes Complains or has symptoms of Glasses / Contacts. Denies complaints or symptoms of Dry Eyes, Vision Changes. Ear/Nose/Mouth/Throat Denies complaints or symptoms of Chronic sinus problems or rhinitis. Respiratory Denies complaints or symptoms of Chronic or frequent coughs, Shortness of Breath. Gastrointestinal Denies complaints or symptoms of Frequent diarrhea, Nausea, Vomiting. Endocrine Denies complaints or symptoms of Heat/cold intolerance. Integumentary (Skin) Complains or has symptoms of Wounds - Right upper tigh, left post upper leg,. Musculoskeletal Complains or has symptoms of Muscle Weakness - legs. Denies complaints or symptoms of Muscle Pain. Psychiatric Denies complaints or symptoms of Claustrophobia, Suicidal. Objective Constitutional respirations regular, non-labored and within target range for patient.. Vitals Time Taken: 8:12 AM, Height: 68 in, Source: Stated, Weight: 269 lbs, Source: Stated, BMI: 40.9, Temperature: 98 F, Pulse: 74 bpm, Respiratory Rate: 18 breaths/min, Blood Pressure: 155/70 mmHg. Cardiovascular 2+ dorsalis pedis/posterior tibialis pulses. Psychiatric pleasant and cooperative. General Notes: Right thigh: T the lateral aspect there are 2 open wounds with nonviable tissue throughout the surface. No surrounding signs of infection. Left o thigh: T the inner aspect there is an open wound again with nonviable tissue throughout. No surrounding signs of infection. o Integumentary (Hair, Skin) Wound #1 status is Open. Original cause of wound was  Gradually Appeared. The date acquired was: 07/14/2021. The wound is located on the Right,Proximal,Lateral Upper Leg. The wound measures 2cm length x 2.5cm width  x 0.1cm depth; 3.927cm^2 area and 0.393cm^3 volume. There is Fat Layer (Subcutaneous Tissue) exposed. There is no tunneling or undermining noted. There is a medium amount of serosanguineous drainage noted. The wound margin is distinct with the outline attached to the wound base. There is small (1-33%) red, pink granulation within the wound bed. There is a large (67-100%) amount of necrotic tissue within the wound bed including Adherent Slough. Wound #2 status is Open. Original cause of wound was Gradually Appeared. The date acquired was: 07/14/2021. The wound is located on the Right,Distal,Lateral Upper Leg. The wound measures 1cm length x 1.1cm width x 0.1cm depth; 0.864cm^2 area and 0.086cm^3 volume. There is Fat Layer (Subcutaneous Tissue) exposed. There is no tunneling or undermining noted. There is a medium amount of serosanguineous drainage noted. The wound margin is distinct with the outline attached to the wound base. There is small (1-33%) pink granulation within the wound bed. There is a large (67-100%) amount of necrotic tissue within the wound bed including Adherent Slough. Wound #3 status is Open. Original cause of wound was Gradually Appeared. The date acquired was: 07/14/2021. The wound is located on the Left,Medial,Posterior Upper Leg. The wound measures 2cm length x 1cm width x 0.2cm depth; 1.571cm^2 area and 0.314cm^3 volume. There is Fat Layer (Subcutaneous Tissue) exposed. There is no tunneling or undermining noted. There is a medium amount of serosanguineous drainage noted. The wound margin is distinct with the outline attached to the wound base. There is large (67-100%) red, pink granulation within the wound bed. There is a small (1-33%) amount of necrotic tissue within the wound bed including Adherent  Slough. Assessment Active Problems ICD-10 Non-pressure chronic ulcer of right thigh with fat layer exposed Non-pressure chronic ulcer of left thigh with fat layer exposed End stage renal disease Other disorders of calcium metabolism Patient presents with waxing and waning of ulcers to her thighs bilaterally for the past 6 months. Is unclear if this is calciphylaxis however these are very painful and she is on hemodialysis. I debrided nonviable tissue. No surrounding signs of infection. I recommended Medihoney daily. I also asked her to discuss sodium thiosulfate with her nephrologist to see if this is an appropriate treatment. Follow-up in 1 week. 46 minutes was spent on the encounter including face-to-face, EMR review and coordination of care. 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 There was a Excisional Skin/Subcutaneous Tissue Debridement with a total area of 5 sq cm performed by Kalman Shan, DO. With the following instrument(s): Curette to remove Viable and Non-Viable tissue/material. Material removed includes Subcutaneous Tissue, Slough, Skin: Dermis, Skin: Epidermis, and Fibrin/Exudate after achieving pain control using Lidocaine 5% topical ointment. A time out was conducted at 08:48, prior to the start of the procedure. A Minimum amount of bleeding was controlled with Pressure. The procedure was tolerated well with a pain level of 0 throughout and a pain level of 0 following the procedure. Post Debridement Measurements: 2cm length x 2.5cm width x 0.2cm depth; 0.785cm^3 volume. Character of Wound/Ulcer Post Debridement is improved. Post procedure Diagnosis Wound #1: Same as Pre-Procedure Wound #2 Pre-procedure diagnosis of Wound #2 is a Calciphylaxis located on the Right,Distal,Lateral Upper Leg . There was a Excisional Skin/Subcutaneous Tissue Debridement with a total area of 1.1 sq cm performed  by Kalman Shan, DO. With the following instrument(s): Curette to remove Viable and Non-Viable tissue/material. Material removed includes Subcutaneous Tissue, Slough, Skin: Dermis, Skin: Epidermis, and  Fibrin/Exudate after achieving pain control using Lidocaine 5% topical ointment. A time out was conducted at 08:48, prior to the start of the procedure. A Minimum amount of bleeding was controlled with Pressure. The procedure was tolerated well with a pain level of 0 throughout and a pain level of 0 following the procedure. Post Debridement Measurements: 1cm length x 1.1cm width x 0.1cm depth; 0.086cm^3 volume. Character of Wound/Ulcer Post Debridement is improved. Post procedure Diagnosis Wound #2: Same as Pre-Procedure Wound #3 Pre-procedure diagnosis of Wound #3 is a Calciphylaxis located on the Left,Medial,Posterior Upper Leg . There was a Excisional Skin/Subcutaneous Tissue Debridement with a total area of 2 sq cm performed by Kalman Shan, DO. With the following instrument(s): Curette to remove Viable and Non-Viable tissue/material. Material removed includes Subcutaneous Tissue, Slough, Skin: Dermis, Skin: Epidermis, and Fibrin/Exudate after achieving pain control using Lidocaine 5% topical ointment. A time out was conducted at 08:48, prior to the start of the procedure. A Minimum amount of bleeding was controlled with Pressure. The procedure was tolerated well with a pain level of 0 throughout and a pain level of 0 following the procedure. Post Debridement Measurements: 2cm length x 1cm width x 0.2cm depth; 0.314cm^3 volume. Character of Wound/Ulcer Post Debridement is improved. Post procedure Diagnosis Wound #3: Same as Pre-Procedure Plan Follow-up Appointments: Return Appointment in 1 week. - Dr. Heber [place] and Allayne Butcher, Room 9 01/05/2022 1000 Other: - [facility] Bathing/ Shower/ Hygiene: May shower and wash wound with soap and water. - with dressing changes. WOUND  #1: - Upper Leg Wound Laterality: Right, Lateral, Proximal Cleanser: Soap and Water 1 x Per Day/30 Days Discharge Instructions: May shower and wash wound with dial antibacterial soap and water prior to dressing change. Cleanser: Wound Cleanser 1 x Per Day/30 Days Discharge Instructions: Cleanse the wound with wound cleanser prior to applying a clean dressing using gauze sponges, not tissue or cotton balls. Peri-Wound Care: Skin Prep 1 x Per Day/30 Days Discharge Instructions: Use skin prep as directed Prim Dressing: MediHoney Gel, tube 1.5 (oz) 1 x Per Day/30 Days ary Discharge Instructions: Apply to wound bed as instructed Secondary Dressing: Zetuvit Plus Silicone Border Dressing 4x4 (in/in) 1 x Per Day/30 Days Discharge Instructions: Apply silicone border over primary dressing as directed. WOUND #2: - Upper Leg Wound Laterality: Right, Lateral, Distal Cleanser: Soap and Water 1 x Per Day/30 Days Discharge Instructions: May shower and wash wound with dial antibacterial soap and water prior to dressing change. Cleanser: Wound Cleanser 1 x Per Day/30 Days Discharge Instructions: Cleanse the wound with wound cleanser prior to applying a clean dressing using gauze sponges, not tissue or cotton balls. Peri-Wound Care: Skin Prep 1 x Per Day/30 Days Discharge Instructions: Use skin prep as directed Prim Dressing: MediHoney Gel, tube 1.5 (oz) 1 x Per Day/30 Days ary Discharge Instructions: Apply to wound bed as instructed Secondary Dressing: Zetuvit Plus Silicone Border Dressing 4x4 (in/in) 1 x Per Day/30 Days Discharge Instructions: Apply silicone border over primary dressing as directed. WOUND #3: - Upper Leg Wound Laterality: Left, Medial, Posterior Cleanser: Soap and Water 1 x Per Day/30 Days Discharge Instructions: May shower and wash wound with dial antibacterial soap and water prior to dressing change. Cleanser: Wound Cleanser 1 x Per Day/30 Days Discharge Instructions: Cleanse the wound  with wound cleanser prior to applying a clean dressing using gauze sponges, not tissue or cotton balls. Peri-Wound Care: Skin Prep 1 x Per Day/30 Days Discharge Instructions: Use skin prep as directed  Prim Dressing: MediHoney Gel, tube 1.5 (oz) 1 x Per Day/30 Days ary Discharge Instructions: Apply to wound bed as instructed Secondary Dressing: Zetuvit Plus Silicone Border Dressing 4x4 (in/in) 1 x Per Day/30 Days Discharge Instructions: Apply silicone border over primary dressing as directed. 1. In office sharp debridement 2. Medihoney 3. Follow-up in 1 week Electronic Signature(s) Signed: 12/29/2021 9:21:00 AM By: Kalman Shan DO Entered By: Kalman Shan on 12/29/2021 09:20:17 -------------------------------------------------------------------------------- HxROS Details Patient Name: Date of Service: [name]. 12/29/2021 8:00 A M Medical Record Number: [id number] Patient Account Number: [id number] Date of Birth/Sex: Treating RN: July 17, 1955 (66 y.o. [name] Primary Care Provider: [place] Other Clinician: Referring Provider: Treating Provider/Extender: Guy Sandifer, MICHA EL Weeks in Treatment: 0 Information Obtained From Chart Constitutional Symptoms (General Health) Complaints and Symptoms: Negative for: Fatigue; Fever; Chills; Marked Weight Change Eyes Complaints and Symptoms: Positive for: Glasses / Contacts Negative for: Dry Eyes; Vision Changes Medical History: Negative for: Cataracts; Glaucoma; Optic Neuritis Ear/Nose/Mouth/Throat Complaints and Symptoms: Negative for: Chronic sinus problems or rhinitis Respiratory Complaints and Symptoms: Negative for: Chronic or frequent coughs; Shortness of Breath Medical History: Positive for: Asthma Negative for: Aspiration; Chronic Obstructive Pulmonary Disease (COPD); Pneumothorax; Sleep Apnea; Tuberculosis Gastrointestinal Complaints and Symptoms: Negative for: Frequent diarrhea;  Nausea; Vomiting Medical History: Positive for: Hepatitis C Negative for: Cirrhosis ; Colitis; Crohns; Hepatitis A; Hepatitis B Endocrine Complaints and Symptoms: Negative for: Heat/cold intolerance Medical History: Positive for: Type II Diabetes Negative for: Type I Diabetes Time with diabetes: 30 years Treated with: Insulin Blood sugar tested every day: Yes Tested : twice Integumentary (Skin) Complaints and Symptoms: Positive for: Wounds - Right upper tigh, left post upper leg, Medical History: Negative for: History of Burn Musculoskeletal Complaints and Symptoms: Positive for: Muscle Weakness - legs Negative for: Muscle Pain Medical History: Positive for: Gout; Osteoarthritis Negative for: Rheumatoid Arthritis; Osteomyelitis Psychiatric Complaints and Symptoms: Negative for: Claustrophobia; Suicidal Hematologic/Lymphatic Medical History: Positive for: Anemia Negative for: Hemophilia; Human Immunodeficiency Virus; Lymphedema; Sickle Cell Disease Cardiovascular Medical History: Positive for: Congestive Heart Failure - EF 55-60%; Coronary Artery Disease; Hypertension; Myocardial Infarction Negative for: Angina; Arrhythmia; Deep Vein Thrombosis; Hypotension; Peripheral Arterial Disease; Peripheral Venous Disease; Phlebitis; Vasculitis Past Medical History Notes: aortic stenosis Genitourinary Medical History: Positive for: End Stage Renal Disease - stage IV-dialysis Immunological Neurologic Medical History: Positive for: Neuropathy Negative for: Dementia; Quadriplegia; Paraplegia; Seizure Disorder Oncologic Immunizations Pneumococcal Vaccine: Received Pneumococcal Vaccination: Yes Received Pneumococcal Vaccination On or After 60th Birthday: Yes Implantable Devices None Family and Social History Cancer: Yes - Mother; Diabetes: Yes - Paternal Grandparents,Siblings; Heart Disease: Yes - Maternal Grandparents; Hypertension: Yes - Siblings; Kidney Disease: No; Lung  Disease: No; Seizures: Yes - Child; Stroke: No; Thyroid Problems: No; Tuberculosis: No; Former smoker - quit 1982; Marital Status - Divorced; Alcohol Use: Never; Drug Use: Prior History; Caffeine Use: Rarely; Financial Concerns: No; Food, Clothing or Shelter Needs: No; Support System Lacking: No; Transportation Concerns: No Electronic Signature(s) Signed: 12/29/2021 9:21:00 AM By: Kalman Shan DO Signed: 12/29/2021 4:08:19 PM By: Erenest Blank Signed: 12/29/2021 5:44:49 PM By: Deon Pilling RN, BSN Entered By: Erenest Blank on 12/29/2021 67:34:19 -------------------------------------------------------------------------------- SuperBill Details Patient Name: Date of Service: [name]. 12/29/2021 Medical Record Number: [id number] Patient Account Number: [id number] Date of Birth/Sex: Treating RN: 28-Nov-1955 (66 y.o. 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 N18.6 End stage renal disease E83.59 Other disorders of calcium metabolism Facility Procedures CPT4 Code: [id number] Description: [place] VISIT-LEV 3 EST PT Modifier: Quantity: 1 CPT4 Code: [id number] Description: [place] - DEB SUBQ TISSUE 20 SQ CM/< ICD-10 Diagnosis Description Q00.867 Non-pressure chronic ulcer of right thigh with fat layer exposed L97.122 Non-pressure chronic ulcer of left thigh with fat layer exposed Modifier: Quantity: 1 Physician Procedures : CPT4 Code Description Modifier [id number] - WC PHYS LEVEL 4 - NEW PT ICD-10 Diagnosis Description L97.112 Non-pressure chronic ulcer of right thigh with fat layer exposed L97.122 Non-pressure chronic ulcer of left thigh with fat  layer exposed N18.6  End stage renal disease E83.59 Other disorders of calcium metabolism Quantity: 1 : [id number] - WC PHYS SUBQ TISS 20 SQ CM ICD-10 Diagnosis Description P38.250 Non-pressure chronic ulcer of right thigh with fat layer exposed L97.122 Non-pressure chronic ulcer of left thigh with fat layer exposed Quantity: 1 Electronic Signature(s) Signed: 12/29/2021 9:21:00 AM By: Kalman Shan DO Entered By: Kalman Shan on 12/29/2021 09:20:35

## 2021-12-31 NOTE — Progress Notes (Signed)
ELESE, RANE ([id number]) Visit Report for 12/29/2021 Allergy List Details Patient Name: Date of Service: [name]. 12/29/2021 8:00 A M Medical Record Number: [id number] Patient Account Number: [id number] Date of Birth/Sex: Treating RN: 1956-02-25 (66 y.o. Debby Bud Primary Care Edwina Grossberg: [place] Other Clinician: Referring Kalif Kattner: Treating Challis Crill/Extender: Guy Sandifer, MICHA EL Weeks in Treatment: 0 Allergies Active Allergies morphine Reaction: itching Shellfish Containing Products Reaction: anaphylaxis Allergy Notes Electronic Signature(s) Signed: 12/29/2021 4:08:19 PM By: Erenest Blank Entered By: Erenest Blank on 12/29/2021 08:13:10 -------------------------------------------------------------------------------- Arrival Information Details Patient Name: Date of Service: [name]. 12/29/2021 8:00 A M Medical Record Number: [id number] Patient Account Number: [id number] Date of Birth/Sex: Treating RN: April 04, 1956 (66 y.o. Benjaman Lobe Primary Care Felicidad Sugarman: [place] Other Clinician: Referring Pearce Littlefield: Treating Akeen Ledyard/Extender: Guy Sandifer, MICHA EL Weeks in Treatment: 0 Visit Information Patient Arrived: Cane Arrival Time: 08:11 Accompanied By: self Transfer Assistance: None Patient Identification Verified: Yes Secondary Verification Process Completed: Yes Patient Requires Transmission-Based No Precautions: Patient Has Alerts: Yes Patient Alerts: Patient on Blood Thinner Patient Refuses Blood Products Electronic Signature(s) Signed: 12/29/2021 4:08:19 PM By: Erenest Blank Entered By: Erenest Blank on 12/29/2021 08:12:16 -------------------------------------------------------------------------------- Clinic Level of Care Assessment Details Patient Name: Date of Service: [name]. 12/29/2021 8:00 A M Medical Record Number: [id number] Patient Account Number: [id number] Date of Birth/Sex:  Treating RN: 1956-03-07 (66 y.o. Debby Bud Primary Care Nachmen Mansel: [place] Other Clinician: Referring Daymen Hassebrock: Treating Damaya Channing/Extender: Guy Sandifer, MICHA EL Weeks in Treatment: 0 Clinic Level of Care Assessment Items TOOL 1 Quantity Score X- 1 0 Use when EandM and Procedure is performed on INITIAL visit ASSESSMENTS - Nursing Assessment / Reassessment X- 1 20 General Physical Exam (combine w/ comprehensive assessment (listed just below) when performed on new pt. evals) X- 1 25 Comprehensive Assessment (HX, ROS, Risk Assessments, Wounds Hx, etc.) ASSESSMENTS - Wound and Skin Assessment / Reassessment X- 1 10 Dermatologic / Skin Assessment (not related to wound area) ASSESSMENTS - Ostomy and/or Continence Assessment and Care []  - 0 Incontinence Assessment and Management []  - 0 Ostomy Care Assessment and Management (repouching, etc.) PROCESS - Coordination of Care []  - 0 Simple Patient / Family Education for ongoing care X- 1 20 Complex (extensive) Patient / Family Education for ongoing care X- 1 10 Staff obtains Programmer, systems, Records, T Results / Process Orders est []  - 0 Staff telephones HHA, Nursing Homes / Clarify orders / etc []  - 0 Routine Transfer to another Facility (non-emergent condition) []  - 0 Routine Hospital Admission (non-emergent condition) X- 1 15 New Admissions / Biomedical engineer / Ordering NPWT Apligraf, etc. , []  - 0 Emergency Hospital Admission (emergent condition) PROCESS - Special Needs []  - 0 Pediatric / Minor Patient Management []  - 0 Isolation Patient Management []  - 0 Hearing / Language / Visual special needs []  - 0 Assessment of Community assistance (transportation, D/C planning, etc.) []  - 0 Additional assistance / Altered mentation []  - 0 Support Surface(s) Assessment (bed, cushion, seat, etc.) INTERVENTIONS - Miscellaneous []  - 0 External ear exam []  - 0 Patient Transfer (multiple staff / Civil Service fast streamer  / Similar devices) []  - 0 Simple Staple / Suture removal (25 or less) []  - 0 Complex Staple / Suture removal (26 or more) []  - 0 Hypo/Hyperglycemic Management (do not check if billed separately) []  - 0 Ankle / Brachial Index (ABI) - do not check if billed separately Has the patient been  seen at the hospital within the last three years: Yes Total Score: 100 Level Of Care: New/Established - Level 3 Electronic Signature(s) Signed: 12/29/2021 5:44:49 PM By: Deon Pilling RN, BSN Signed: 12/29/2021 5:44:49 PM By: Deon Pilling RN, BSN Entered By: Deon Pilling on 12/29/2021 08:57:53 -------------------------------------------------------------------------------- Encounter Discharge Information Details Patient Name: Date of Service: Angelina Pih M. 12/29/2021 8:00 A M Medical Record Number: [id number] Patient Account Number: [id number] Date of Birth/Sex: Treating RN: Jun 29, 1956 (66 y.o. Debby Bud Primary Care Librada Castronovo: [place] Other Clinician: Referring Kaslyn Richburg: Treating Zakhia Seres/Extender: Guy Sandifer, MICHA EL Weeks in Treatment: 0 Encounter Discharge Information Items Post Procedure Vitals Discharge Condition: Stable Temperature (F): 98 Ambulatory Status: Cane Pulse (bpm): 74 Discharge Destination: Home Respiratory Rate (breaths/min): 20 Transportation: Private Auto Blood Pressure (mmHg): 155/70 Accompanied By: self Schedule Follow-up Appointment: Yes Clinical Summary of Care: Electronic Signature(s) Signed: 12/29/2021 5:44:49 PM By: Deon Pilling RN, BSN Entered By: Deon Pilling on 12/29/2021 16:36:26 -------------------------------------------------------------------------------- Lower Extremity Assessment Details Patient Name: Date of Service: [name]. 12/29/2021 8:00 A M Medical Record Number: [id number] Patient Account Number: [id number] Date of Birth/Sex: Treating RN: 04-Dec-1955 (66 y.o. Debby Bud Primary Care Ardon Franklin:  [place] Other Clinician: Referring Madelyn Tlatelpa: Treating Tauren Delbuono/Extender: Guy Sandifer, MICHA EL Weeks in Treatment: 0 Notes No BLE wounds. Electronic Signature(s) Signed: 12/29/2021 4:08:19 PM By: Erenest Blank Signed: 12/29/2021 5:44:49 PM By: Deon Pilling RN, BSN Entered By: Erenest Blank on 12/29/2021 08:29:27 -------------------------------------------------------------------------------- Multi Wound Chart Details Patient Name: Date of Service: [name]. 12/29/2021 8:00 A M Medical Record Number: [id number] Patient Account Number: [id number] Date of Birth/Sex: Treating RN: 09-11-1955 (66 y.o. Benjaman Lobe Primary Care Bryann Mcnealy: [place] Other Clinician: Referring Alicyn Klann: Treating Aroush Chasse/Extender: Guy Sandifer, MICHA EL Weeks in Treatment: 0 Vital Signs Height(in): 70 Pulse(bpm): 41 Weight(lbs): 92 Blood Pressure(mmHg): 155/70 Body Mass Index(BMI): 40.9 Temperature(F): 98 Respiratory Rate(breaths/min): 18 Photos: Right, Proximal, Lateral Upper Leg Right, Distal, Lateral Upper Leg Left, Medial, Posterior Upper Leg Wound Location: Gradually Appeared Gradually Appeared Gradually Appeared Wounding Event: Calciphylaxis Calciphylaxis Calciphylaxis Primary Etiology: Anemia, Asthma, Congestive Heart Anemia, Asthma, Congestive Heart Anemia, Asthma, Congestive Heart Comorbid History: Failure, Coronary Artery Disease, Failure, Coronary Artery Disease, Failure, Coronary Artery Disease, Hypertension, Myocardial Infarction, Hypertension, Myocardial Infarction, Hypertension, Myocardial Infarction, Hepatitis C, Type II Diabetes, End Hepatitis C, Type II Diabetes, End Hepatitis C, Type II Diabetes, End Stage Renal Disease, Gout, Stage Renal Disease, Gout, Stage Renal Disease, Gout, Osteoarthritis Osteoarthritis Osteoarthritis, Neuropathy 07/14/2021 07/14/2021 07/14/2021 Date Acquired: 0 0 0 Weeks of Treatment: Open Open  Open Wound Status: No No No Wound Recurrence: 2x2.5x0.1 1x1.1x0.1 2x1x0.2 Measurements L x W x D (cm) 3.927 0.864 1.571 A (cm) : rea 0.393 0.086 0.314 Volume (cm) : Full Thickness Without Exposed Full Thickness Without Exposed Full Thickness Without Exposed Classification: Support Structures Support Structures Support Structures Medium Medium Medium Exudate A mount: Serosanguineous Serosanguineous Serosanguineous Exudate Type: red, brown red, brown red, brown Exudate Color: Distinct, outline attached Distinct, outline attached Distinct, outline attached Wound Margin: Small (1-33%) Small (1-33%) Large (67-100%) Granulation A mount: Red, Pink Pink Red, Pink Granulation Quality: Large (67-100%) Large (67-100%) Small (1-33%) Necrotic A mount: Fat Layer (Subcutaneous Tissue): Yes Fat Layer (Subcutaneous Tissue): Yes Fat Layer (Subcutaneous Tissue): Yes Exposed Structures: Fascia: No Fascia: No Fascia: No Tendon: No Tendon: No Tendon: No Muscle: No Muscle: No Muscle: No Joint: No Joint: No Joint: No Bone: No Bone: No Bone: No Small (  1-33%) Small (1-33%) Small (1-33%) Epithelialization: Debridement - Excisional Debridement - Excisional Debridement - Excisional Debridement: Pre-procedure Verification/Time Out 08:48 08:48 08:48 Taken: Lidocaine 5% topical ointment Lidocaine 5% topical ointment Lidocaine 5% topical ointment Pain Control: Subcutaneous, Slough Subcutaneous, Slough Subcutaneous, Slough Tissue Debrided: Skin/Subcutaneous Tissue Skin/Subcutaneous Tissue Skin/Subcutaneous Tissue Level: 5 1.1 2 Debridement A (sq cm): rea Curette Curette Curette Instrument: Minimum Minimum Minimum Bleeding: Pressure Pressure Pressure Hemostasis A chieved: 0 0 0 Procedural Pain: 0 0 0 Post Procedural Pain: Procedure was tolerated well Procedure was tolerated well Procedure was tolerated well Debridement Treatment Response: 2x2.5x0.2 1x1.1x0.1 2x1x0.2 Post  Debridement Measurements L x W x D (cm) 0.785 0.086 0.314 Post Debridement Volume: (cm) Debridement Debridement Debridement Procedures Performed: Treatment Notes Electronic Signature(s) Signed: 12/29/2021 9:21:00 AM By: Kalman Shan DO Signed: 12/31/2021 4:06:51 PM By: [name] RN Entered By: Kalman Shan on 12/29/2021 09:11:48 -------------------------------------------------------------------------------- Multi-Disciplinary Care Plan Details Patient Name: Date of Service: Angelina Pih M. 12/29/2021 8:00 A M Medical Record Number: [id number] Patient Account Number: [id number] Date of Birth/Sex: Treating RN: 08/23/1955 (66 y.o. Debby Bud Primary Care Abuk Selleck: [place] Other Clinician: Referring Vergie Zahm: Treating Paymon Rosensteel/Extender: Guy Sandifer, MICHA EL Weeks in Treatment: 0 Active Inactive Necrotic Tissue Nursing Diagnoses: Impaired tissue integrity related to necrotic/devitalized tissue Goals: Necrotic/devitalized tissue will be minimized in the wound bed Date Initiated: 12/29/2021 Target Resolution Date: 01/16/2022 Goal Status: Active Interventions: Assess patient pain level pre-, during and post procedure and prior to discharge Provide education on necrotic tissue and debridement process Treatment Activities: Apply topical anesthetic as ordered : 12/29/2021 Enzymatic debridement : 12/29/2021 Notes: Orientation to the Wound Care Program Nursing Diagnoses: Knowledge deficit related to the wound healing center program Goals: Patient/caregiver will verbalize understanding of the [place] Date Initiated: 12/29/2021 Target Resolution Date: 01/16/2022 Goal Status: Active Interventions: Provide education on orientation to the wound center Notes: Pain, Acute or Chronic Nursing Diagnoses: Pain, acute or chronic: actual or potential Potential alteration in comfort, pain Goals: Patient will verbalize adequate pain  control and receive pain control interventions during procedures as needed Date Initiated: 12/29/2021 Target Resolution Date: 01/15/2022 Goal Status: Active Patient/caregiver will verbalize comfort level met Date Initiated: 12/29/2021 Target Resolution Date: 01/15/2022 Goal Status: Active Interventions: Complete pain assessment as per visit requirements Encourage patient to take pain medications as prescribed Provide education on pain management Treatment Activities: Administer pain control measures as ordered : 12/29/2021 Notes: Electronic Signature(s) Signed: 12/29/2021 5:44:49 PM By: Deon Pilling RN, BSN Entered By: Deon Pilling on 12/29/2021 08:33:44 -------------------------------------------------------------------------------- Pain Assessment Details Patient Name: Date of Service: [name]. 12/29/2021 8:00 A M Medical Record Number: [id number] Patient Account Number: [id number] Date of Birth/Sex: Treating RN: [birth year] (66 y.o. Debby Bud Primary Care Lowanda Cashaw: [place] Other Clinician: Referring Tulsi Crossett: Treating Burnis Halling/Extender: Guy Sandifer, MICHA EL Weeks in Treatment: 0 Active Problems Location of Pain Severity and Description of Pain Patient Has Paino Yes Site Locations Pain Location: Pain in Ulcers Rate the pain. Current Pain Level: 10 Character of Pain Describe the Pain: Aching, Heavy, Sharp Pain Management and Medication Current Pain Management: Medication: No Cold Application: No Rest: No Massage: No Activity: No T.E.N.S.: No Heat Application: No Leg drop or elevation: No Is the Current Pain Management Adequate: Adequate How does your wound impact your activities of daily livingo Sleep: No Bathing: No Appetite: No Relationship With Others: No Bladder Continence: No Emotions: No Bowel Continence: No Work: No Toileting: No Drive:  No Dressing: No Hobbies: No Electronic Signature(s) Signed: 12/29/2021 5:44:49 PM  By: Deon Pilling RN, BSN Entered By: Deon Pilling on 12/29/2021 08:26:44 -------------------------------------------------------------------------------- Patient/Caregiver Education Details Patient Name: Date of Service: [name] 6/19/2023andnbsp8:00 A M Medical Record Number: [id number] Patient Account Number: [id number] Date of Birth/Gender: Treating RN: 06-12-1956 (66 y.o. Debby Bud Primary Care Physician: [place] Other Clinician: Referring Physician: Treating Physician/Extender: Norm Parcel EL Weeks in Treatment: 0 Education Assessment Education Provided To: Patient Education Topics Provided Wound Debridement: Handouts: Wound Debridement Methods: Explain/Verbal Responses: Reinforcements needed Electronic Signature(s) Signed: 12/29/2021 5:44:49 PM By: Deon Pilling RN, BSN Entered By: Deon Pilling on 12/29/2021 08:33:52 -------------------------------------------------------------------------------- Wound Assessment Details Patient Name: Date of Service: Angelina Pih M. 12/29/2021 8:00 A M Medical Record Number: [id number] Patient Account Number: [id number] Date of Birth/Sex: Treating RN: 11-23-1955 (66 y.o. Debby Bud Primary Care Renette Hsu: [place] Other Clinician: Referring Kanya Potteiger: Treating [place] Souter/Extender: Guy Sandifer, MICHA EL Weeks in Treatment: 0 Wound Status Wound Number: 1 Primary Calciphylaxis Etiology: Wound Location: Right, Proximal, Lateral Upper Leg Wound Open Wounding Event: Gradually Appeared Status: Date Acquired: 07/14/2021 Comorbid Anemia, Asthma, Congestive Heart Failure, Coronary Artery Weeks Of Treatment: 0 History: Disease, Hypertension, Myocardial Infarction, Hepatitis C, Type II Clustered Wound: No Diabetes, End Stage Renal Disease, Gout, Osteoarthritis Photos Wound Measurements Length: (cm) 2 Width: (cm) 2.5 Depth: (cm) 0.1 Area: (cm) 3.927 Volume: (cm) 0.393 %  Reduction in Area: % Reduction in Volume: Epithelialization: Small (1-33%) Tunneling: No Undermining: No Wound Description Classification: Full Thickness Without Exposed Support Structures Wound Margin: Distinct, outline attached Exudate Amount: Medium Exudate Type: Serosanguineous Exudate Color: red, brown Foul Odor After Cleansing: No Slough/Fibrino Yes Wound Bed Granulation Amount: Small (1-33%) Exposed Structure Granulation Quality: Red, Pink Fascia Exposed: No Necrotic Amount: Large (67-100%) Fat Layer (Subcutaneous Tissue) Exposed: Yes Necrotic Quality: Adherent Slough Tendon Exposed: No Muscle Exposed: No Joint Exposed: No Bone Exposed: No Treatment Notes Wound #1 (Upper Leg) Wound Laterality: Right, Lateral, Proximal Cleanser Soap and Water Discharge Instruction: May shower and wash wound with dial antibacterial soap and water prior to dressing change. Wound Cleanser Discharge Instruction: Cleanse the wound with wound cleanser prior to applying a clean dressing using gauze sponges, not tissue or cotton balls. Peri-Wound Care Skin Prep Discharge Instruction: Use skin prep as directed Topical Primary Dressing MediHoney Gel, tube 1.5 (oz) Discharge Instruction: Apply to wound bed as instructed Secondary Dressing Zetuvit Plus Silicone Border Dressing 4x4 (in/in) Discharge Instruction: Apply silicone border over primary dressing as directed. Secured With Compression Wrap Compression Stockings Environmental education officer) Signed: 12/29/2021 5:44:49 PM By: Deon Pilling RN, BSN Entered By: Deon Pilling on 12/29/2021 08:23:35 -------------------------------------------------------------------------------- Wound Assessment Details Patient Name: Date of Service: [name]. 12/29/2021 8:00 A M Medical Record Number: [id number] Patient Account Number: [id number] Date of Birth/Sex: Treating RN: 03/06/1956 (66 y.o. Debby Bud Primary Care Coren Crownover:  [place] Other Clinician: Referring Cherylynn Liszewski: Treating Taavi Hoose/Extender: Guy Sandifer, MICHA EL Weeks in Treatment: 0 Wound Status Wound Number: 2 Primary Calciphylaxis Etiology: Wound Location: Right, Distal, Lateral Upper Leg Wound Open Wounding Event: Gradually Appeared Status: Date Acquired: 07/14/2021 Comorbid Anemia, Asthma, Congestive Heart Failure, Coronary Artery Weeks Of Treatment: 0 History: Disease, Hypertension, Myocardial Infarction, Hepatitis C, Type II Clustered Wound: No Diabetes, End Stage Renal Disease, Gout, Osteoarthritis Photos Wound Measurements Length: (cm) 1 Width: (cm) 1.1 Depth: (cm) 0.1 Area: (cm) 0.864 Volume: (cm) 0.086 % Reduction in Area: %  Reduction in Volume: Epithelialization: Small (1-33%) Tunneling: No Undermining: No Wound Description Classification: Full Thickness Without Exposed Support Structures Wound Margin: Distinct, outline attached Exudate Amount: Medium Exudate Type: Serosanguineous Exudate Color: red, brown Foul Odor After Cleansing: No Slough/Fibrino Yes Wound Bed Granulation Amount: Small (1-33%) Exposed Structure Granulation Quality: Pink Fascia Exposed: No Necrotic Amount: Large (67-100%) Fat Layer (Subcutaneous Tissue) Exposed: Yes Necrotic Quality: Adherent Slough Tendon Exposed: No Muscle Exposed: No Joint Exposed: No Bone Exposed: No Treatment Notes Wound #2 (Upper Leg) Wound Laterality: Right, Lateral, Distal Cleanser Soap and Water Discharge Instruction: May shower and wash wound with dial antibacterial soap and water prior to dressing change. Wound Cleanser Discharge Instruction: Cleanse the wound with wound cleanser prior to applying a clean dressing using gauze sponges, not tissue or cotton balls. Peri-Wound Care Skin Prep Discharge Instruction: Use skin prep as directed Topical Primary Dressing MediHoney Gel, tube 1.5 (oz) Discharge Instruction: Apply to wound bed as  instructed Secondary Dressing Zetuvit Plus Silicone Border Dressing 4x4 (in/in) Discharge Instruction: Apply silicone border over primary dressing as directed. Secured With Compression Wrap Compression Stockings Environmental education officer) Signed: 12/29/2021 5:44:49 PM By: Deon Pilling RN, BSN Entered By: Deon Pilling on 12/29/2021 08:24:50 -------------------------------------------------------------------------------- Wound Assessment Details Patient Name: Date of Service: [name]. 12/29/2021 8:00 A M Medical Record Number: [id number] Patient Account Number: [id number] Date of Birth/Sex: Treating RN: 05/26/1956 (66 y.o. Debby Bud Primary Care Allex Madia: [place] Other Clinician: Referring Lorry Anastasi: Treating Paulette Rockford/Extender: Guy Sandifer, MICHA EL Weeks in Treatment: 0 Wound Status Wound Number: 3 Primary Calciphylaxis Etiology: Wound Location: Left, Medial, Posterior Upper Leg Wound Open Wounding Event: Gradually Appeared Status: Date Acquired: 07/14/2021 Comorbid Anemia, Asthma, Congestive Heart Failure, Coronary Artery Weeks Of Treatment: 0 History: Disease, Hypertension, Myocardial Infarction, Hepatitis C, Type II Clustered Wound: No Diabetes, End Stage Renal Disease, Gout, Osteoarthritis, Neuropathy Photos Wound Measurements Length: (cm) 2 Width: (cm) 1 Depth: (cm) 0.2 Area: (cm) 1.571 Volume: (cm) 0.314 % Reduction in Area: % Reduction in Volume: Epithelialization: Small (1-33%) Tunneling: No Undermining: No Wound Description Classification: Full Thickness Without Exposed Support Structures Wound Margin: Distinct, outline attached Exudate Amount: Medium Exudate Type: Serosanguineous Exudate Color: red, brown Foul Odor After Cleansing: No Slough/Fibrino Yes Wound Bed Granulation Amount: Large (67-100%) Exposed Structure Granulation Quality: Red, Pink Fascia Exposed: No Necrotic Amount: Small (1-33%) Fat Layer  (Subcutaneous Tissue) Exposed: Yes Necrotic Quality: Adherent Slough Tendon Exposed: No Muscle Exposed: No Joint Exposed: No Bone Exposed: No Treatment Notes Wound #3 (Upper Leg) Wound Laterality: Left, Medial, Posterior Cleanser Soap and Water Discharge Instruction: May shower and wash wound with dial antibacterial soap and water prior to dressing change. Wound Cleanser Discharge Instruction: Cleanse the wound with wound cleanser prior to applying a clean dressing using gauze sponges, not tissue or cotton balls. Peri-Wound Care Skin Prep Discharge Instruction: Use skin prep as directed Topical Primary Dressing MediHoney Gel, tube 1.5 (oz) Discharge Instruction: Apply to wound bed as instructed Secondary Dressing Zetuvit Plus Silicone Border Dressing 4x4 (in/in) Discharge Instruction: Apply silicone border over primary dressing as directed. Secured With Compression Wrap Compression Stockings Environmental education officer) Signed: 12/29/2021 5:44:49 PM By: Deon Pilling RN, BSN Entered By: Deon Pilling on 12/29/2021 95:18:84 -------------------------------------------------------------------------------- Vitals Details Patient Name: Date of Service: Angelina Pih M. 12/29/2021 8:00 A M Medical Record Number: [id number] Patient Account Number: [id number] Date of Birth/Sex: Treating RN: 1956-01-26 (66 y.o. Benjaman Lobe Primary Care Kailey Esquilin: [place] Other Clinician: Referring  Kevin Mario: Treating Zenobia Kuennen/Extender: Guy Sandifer, MICHA EL Weeks in Treatment: 0 Vital Signs Time Taken: 08:12 Temperature (F): 98 Height (in): 68 Pulse (bpm): 74 Source: Stated Respiratory Rate (breaths/min): 18 Weight (lbs): 269 Blood Pressure (mmHg): 155/70 Source: Stated Reference Range: 80 - 120 mg / dl Body Mass Index (BMI): 40.9 Electronic Signature(s) Signed: 12/29/2021 4:08:19 PM By: Erenest Blank Entered By: Erenest Blank on 12/29/2021 08:12:51

## 2021-12-31 NOTE — Progress Notes (Signed)
COLBIE, SLIKER ([id number]) Visit Report for 12/29/2021 Abuse Risk Screen Details Patient Name: Date of Service: [name] Alert. 12/29/2021 8:00 A M Medical Record Number: [id number] Patient Account Number: [id number] Date of Birth/Sex: Treating RN: 1955-10-30 (66 y.o. Benjaman Lobe Primary Care [facility] Lennox: [place] Other Clinician: Referring Jermar Colter: Treating Lida Berkery/Extender: Guy Sandifer, MICHA EL Weeks in Treatment: 0 Abuse Risk Screen Items Answer ABUSE RISK SCREEN: Has anyone close to you tried to hurt or harm you recentlyo No Do you feel uncomfortable with anyone in your familyo No Has anyone forced you do things that you didnt want to doo No Electronic Signature(s) Signed: 12/29/2021 4:08:19 PM By: Erenest Blank Signed: 12/31/2021 4:06:51 PM By: [name] RN Entered By: Erenest Blank on 12/29/2021 08:24:35 -------------------------------------------------------------------------------- Activities of Daily Living Details Patient Name: Date of Service: [name] Alert. 12/29/2021 8:00 A M Medical Record Number: [id number] Patient Account Number: [id number] Date of Birth/Sex: Treating RN: 01-06-1956 (66 y.o. Benjaman Lobe Primary Care Pritesh Sobecki: [place] Other Clinician: Referring Frantz Quattrone: Treating Darrelyn Morro/Extender: Guy Sandifer, MICHA EL Weeks in Treatment: 0 Activities of Daily Living Items Answer Activities of Daily Living (Please select one for each item) Drive Automobile Completely Able T Medications ake Completely Able Use T elephone Completely Able Care for Appearance Completely Able Use T oilet Completely Able Bath / Shower Completely Able Dress Self Completely Able Feed Self Completely Able Walk Completely Able Get In / Out Bed Completely Able Housework Completely Able Prepare Meals Completely Able Handle Money Completely Able Shop for Self Completely Able Electronic Signature(s) Signed: 12/29/2021  4:08:19 PM By: Erenest Blank Signed: 12/31/2021 4:06:51 PM By: [name] RN Entered By: Erenest Blank on 12/29/2021 08:25:20 -------------------------------------------------------------------------------- Education Screening Details Patient Name: Date of Service: Angelina Pih M. 12/29/2021 8:00 A M Medical Record Number: [id number] Patient Account Number: [id number] Date of Birth/Sex: Treating RN: [birth year] (66 y.o. Debby Bud Primary Care Sheffield Hawker: [place] Other Clinician: Referring Braysen Cloward: Treating Kysha Muralles/Extender: Norm Parcel EL Weeks in Treatment: 0 Primary Learner Assessed: Patient Learning Preferences/Education Level/Primary Language Learning Preference: Explanation, Demonstration, Printed Material Highest Education Level: College or Above Preferred Language: English Cognitive Barrier Language Barrier: No Translator Needed: No Memory Deficit: No Emotional Barrier: No Cultural/Religious Beliefs Affecting Medical Care: Yes jehovah's witness Physical Barrier Impaired Vision: Yes Glasses Impaired Hearing: No Decreased Hand dexterity: No Knowledge/Comprehension Knowledge Level: High Comprehension Level: High Ability to understand written instructions: High Ability to understand verbal instructions: High Motivation Anxiety Level: Calm Cooperation: Cooperative Education Importance: Acknowledges Need Interest in Health Problems: Asks Questions Perception: Coherent Willingness to Engage in Self-Management High Activities: Readiness to Engage in Self-Management High Activities: Electronic Signature(s) Signed: 12/29/2021 4:08:19 PM By: Erenest Blank Signed: 12/29/2021 5:44:49 PM By: Deon Pilling RN, BSN Entered By: Erenest Blank on 12/29/2021 08:26:12 -------------------------------------------------------------------------------- Fall Risk Assessment Details Patient Name: Date of Service: RA Bevely Palmer M. 12/29/2021  8:00 A M Medical Record Number: [id number] Patient Account Number: [id number] Date of Birth/Sex: Treating RN: 1956-03-09 (66 y.o. Benjaman Lobe Primary Care Fredick Schlosser: [place] Other Clinician: Referring Brena Windsor: Treating Whitnie Deleon/Extender: Guy Sandifer, MICHA EL Weeks in Treatment: 0 Fall Risk Assessment Items Have you had 2 or more falls in the last 12 monthso 0 Yes Have you had any fall that resulted in injury in the last 12 monthso 0 No FALLS RISK SCREEN History of falling - immediate or within 3 months 0 No Secondary diagnosis (Do you have 2  or more medical diagnoseso) 0 No Ambulatory aid None/bed rest/wheelchair/nurse 0 No Crutches/cane/walker 15 Yes Furniture 0 No Intravenous therapy Access/Saline/Heparin Lock 0 No Gait/Transferring Normal/ bed rest/ wheelchair 0 No Weak (short steps with or without shuffle, stooped but able to lift head while walking, may seek 0 No support from furniture) Impaired (short steps with shuffle, may have difficulty arising from chair, head down, impaired 0 No balance) Mental Status Oriented to own ability 0 Yes Electronic Signature(s) Signed: 12/29/2021 4:08:19 PM By: Erenest Blank Signed: 12/31/2021 4:06:51 PM By: [name] RN Entered By: Erenest Blank on 12/29/2021 08:27:37 -------------------------------------------------------------------------------- Foot Assessment Details Patient Name: Date of Service: Angelina Pih M. 12/29/2021 8:00 A M Medical Record Number: [id number] Patient Account Number: [id number] Date of Birth/Sex: Treating RN: 12/15/1955 (66 y.o. Debby Bud Primary Care Braydee Shimkus: [place] Other Clinician: Referring Leland Staszewski: Treating Koda Defrank/Extender: Guy Sandifer, MICHA EL Weeks in Treatment: 0 Foot Assessment Items Site Locations + = Sensation present, - = Sensation absent, C = Callus, U = Ulcer R = Redness, W = Warmth, M = Maceration, PU = Pre-ulcerative  lesion F = Fissure, S = Swelling, D = Dryness Assessment Right: Left: Other Deformity: No No Prior Foot Ulcer: No No Prior Amputation: No No Charcot Joint: No No Ambulatory Status: Gait: Notes No BLE WOUNDS Electronic Signature(s) Signed: 12/29/2021 4:08:19 PM By: Erenest Blank Signed: 12/29/2021 5:44:49 PM By: Deon Pilling RN, BSN Entered By: Erenest Blank on 12/29/2021 08:29:20 -------------------------------------------------------------------------------- Nutrition Risk Screening Details Patient Name: Date of Service: Angelina Pih M. 12/29/2021 8:00 A M Medical Record Number: [id number] Patient Account Number: [id number] Date of Birth/Sex: Treating RN: March 31, 1956 (66 y.o. Benjaman Lobe Primary Care Sharnette Kitamura: [place] Other Clinician: Referring Woodrow Drab: Treating Keelie Zemanek/Extender: Guy Sandifer, MICHA EL Weeks in Treatment: 0 Height (in): 68 Weight (lbs): 269 Body Mass Index (BMI): 40.9 Nutrition Risk Screening Items Score Screening NUTRITION RISK SCREEN: I have an illness or condition that made me change the kind and/or amount of food I eat 0 No I eat fewer than two meals per day 3 Yes I eat few fruits and vegetables, or milk products 2 Yes I have three or more drinks of beer, liquor or wine almost every day 0 No I have tooth or mouth problems that make it hard for me to eat 0 No I don't always have enough money to buy the food I need 0 No I eat alone most of the time 0 No I take three or more different prescribed or over-the-counter drugs a day 1 Yes Without wanting to, I have lost or gained 10 pounds in the last six months 2 Yes I am not always physically able to shop, cook and/or feed myself 0 No Nutrition Protocols Good Risk Protocol Moderate Risk Protocol Provide education on elevated blood High Risk Proctocol 0 sugars and impact on wound healing, as applicable Risk Level: High Risk Score: 8 Electronic Signature(s) Signed:  12/29/2021 4:08:19 PM By: Erenest Blank Signed: 12/31/2021 4:06:51 PM By: [name] RN Entered By: Erenest Blank on 12/29/2021 08:29:12

## 2022-01-05 ENCOUNTER — Encounter (HOSPITAL_BASED_OUTPATIENT_CLINIC_OR_DEPARTMENT_OTHER): Payer: Medicare Other | Admitting: Internal Medicine

## 2022-01-05 DIAGNOSIS — N186 End stage renal disease: Secondary | ICD-10-CM

## 2022-01-05 DIAGNOSIS — L97112 Non-pressure chronic ulcer of right thigh with fat layer exposed: Secondary | ICD-10-CM | POA: Diagnosis not present

## 2022-01-05 DIAGNOSIS — L97122 Non-pressure chronic ulcer of left thigh with fat layer exposed: Secondary | ICD-10-CM

## 2022-01-05 NOTE — Progress Notes (Addendum)
Heather, [name] ([id number]) Visit Report for 01/05/2022 Chief Complaint Document Details Patient Name: Date of Service: [name] Alert. 01/05/2022 10:00 A M Medical Record Number: [id number] Patient Account Number: [id number] Date of Birth/Sex: Treating RN: [birth year] (66 y.o. Benjaman Lobe Primary Care Provider: [facility] [place] Other Clinician: Referring Provider: Treating Provider/Extender: Jannette Spanner, Bethany Weeks in Treatment: 1 Information Obtained from: Patient Chief Complaint 12/29/2021; bilateral thigh wounds Electronic Signature(s) Signed: 01/05/2022 12:47:52 PM By: Kalman Shan DO Entered By: Kalman Shan on 01/05/2022 12:42:48 -------------------------------------------------------------------------------- HPI Details Patient Name: Date of Service: [name] Alert. 01/05/2022 10:00 A M Medical Record Number: [id number] Patient Account Number: [id number] Date of Birth/Sex: Treating RN: [birth year] (66 y.o. Benjaman Lobe Primary Care Provider: [facility] [place] Other Clinician: Referring Provider: Treating Provider/Extender: Kalman Shan [facility], Beth[place] Weeks in Treatment: 1 History of Present Illness HPI Description: Admission 12/29/2021 [name] is a 66 year old female with a past medical history of uncontrolled type 2 diabetes on hemodialysis, OSA and chronic hep C that presents to the clinic for a 11-monthhistory of waxing and waning of ulcers to her lower extremities bilaterally. She states that her nephrologist has seen these wounds. She is not currently on sodium thiosulfate. She reports using Neosporin and a wound cleanser daily to the wound beds. She currently denies systemic signs of infection. 6/26; patient presents for follow-up. She states that she tried Medihoney but reports burning to the wound sites. She stopped this after using it a couple days. She has been keeping the wounds open to air. She currently denies signs  of infection. She declines debridement today. Electronic Signature(s) Signed: 01/05/2022 12:47:52 PM By: HKalman ShanDO Entered By: HKalman Shanon 01/05/2022 12:43:25 -------------------------------------------------------------------------------- Physical Exam Details Patient Name: Date of Service: RUrsula Alert6/26/2023 10:00 A M Medical Record Number: [id number] Account Number: [id number] of Birth/Sex: Treating RN: 905/30/1957(66y.o. FBenjaman LobePrimary Care Provider: Other Clinician: [facility], [place] StarcherReferring Provider: Treating Provider/Extender: HKalman ShanCenter, Bethany Weeks in Treatment: 1 Constitutional respirations regular, non-labored and within target range for patient.. Cardiovascular 2+ dorsalis pedis/posterior tibialis pulses. Psychiatric pleasant and cooperative. Notes Right thigh: T the lateral aspect there is 1 open wound with granulation tissue and nonviable tissue. She used to have 2 wounds in this area but there is o epithelization to the previous wound site. Left thigh: T the inner aspect there is an open wound with granulation tissue and nonviable tissue. o No surrounding signs of soft tissue infection to any of the wound beds. Electronic Signature(s) Signed: 01/05/2022 12:47:52 PM By: HKalman ShanDO Entered By: HKalman Shanon 01/05/2022 12:44:35 -------------------------------------------------------------------------------- Physician Orders Details Patient Name: Date of Service: RUrsula Alert 01/05/2022 10:00 A M Medical Record Number: [id number] Account Number: [id number] of Birth/Sex: Treating RN: 923-Apr-1957(66y.o. FBenjaman LobePrimary Care Provider: [facility], [place]Other Clinician: Referring Provider: Treating Provider/Extender: HJannette Spanner Bethany Weeks in Treatment: 1 Verbal / Phone Orders: No Diagnosis Coding Follow-up Appointments ppointment in 1  week. - 01/19/22 @ 1000 w/ Dr. HHeber Carolinaand LAllayne ButcherRoom # 9 Return A Other: - [facility] Bathing/ Shower/ Hygiene May shower and wash wound with soap and water. - with dressing changes. Wound Treatment Wound #1 - Upper Leg Wound Laterality: Right, Lateral, Proximal Cleanser: Soap and Water 1 x Per Day/30 Days Discharge Instructions: May shower and wash wound with dial antibacterial soap and water prior  to dressing change. Cleanser: Wound Cleanser 1 x Per Day/30 Days Discharge Instructions: Cleanse the wound with wound cleanser prior to applying a clean dressing using gauze sponges, not tissue or cotton balls. Peri-Wound Care: Skin Prep 1 x Per Day/30 Days Discharge Instructions: Use skin prep as directed Prim Dressing: Dakin's Solution 0.25%, 16 (oz) 1 x Per Day/30 Days ary Discharge Instructions: Moisten gauze with Dakin's solution Secondary Dressing: Zetuvit Plus Silicone Border Dressing 4x4 (in/in) 1 x Per Day/30 Days Discharge Instructions: Apply silicone border over primary dressing as directed. Wound #3 - Upper Leg Wound Laterality: Left, Medial, Posterior Cleanser: Soap and Water 1 x Per Day/30 Days Discharge Instructions: May shower and wash wound with dial antibacterial soap and water prior to dressing change. Cleanser: Wound Cleanser 1 x Per Day/30 Days Discharge Instructions: Cleanse the wound with wound cleanser prior to applying a clean dressing using gauze sponges, not tissue or cotton balls. Peri-Wound Care: Skin Prep 1 x Per Day/30 Days Discharge Instructions: Use skin prep as directed Prim Dressing: Dakin's Solution 0.25%, 16 (oz) 1 x Per Day/30 Days ary Discharge Instructions: Moisten gauze with Dakin's solution Secondary Dressing: Zetuvit Plus Silicone Border Dressing 4x4 (in/in) 1 x Per Day/30 Days Discharge Instructions: Apply silicone border over primary dressing as directed. Patient Medications llergies: morphine, Shellfish Containing  Products A Notifications Medication Indication Start End 01/05/2022 Dakin's Solution DOSE 1 - miscellaneous 0.125 % solution - moisten gauze for wet to dry dressings Electronic Signature(s) Signed: 01/05/2022 12:47:52 PM By: Kalman Shan DO Previous Signature: 01/05/2022 10:59:51 AM Version By: Kalman Shan DO Entered By: Kalman Shan on 01/05/2022 12:44:44 -------------------------------------------------------------------------------- Problem List Details Patient Name: Date of Service: [name] Alert. 01/05/2022 10:00 A M Medical Record Number: [id number] Patient Account Number: [id number] Date of Birth/Sex: Treating RN: February 28, 1956 (66 y.o. Benjaman Lobe Primary Care Provider: [facility] [place] Other Clinician: Referring Provider: Treating Provider/Extender: Kalman Shan [facility], Bethany Weeks in Treatment: 1 Active Problems ICD-10 Encounter Code Description Active Date MDM Diagnosis L97.112 Non-pressure chronic ulcer of right thigh with fat layer exposed 12/29/2021 No Yes L97.122 Non-pressure chronic ulcer of left thigh with fat layer exposed 12/29/2021 No Yes N18.6 End stage renal disease 12/29/2021 No Yes E83.59 Other disorders of calcium metabolism 12/29/2021 No Yes Inactive Problems Resolved Problems Electronic Signature(s) Signed: 01/05/2022 12:47:52 PM By: Kalman Shan DO Entered By: Kalman Shan on 01/05/2022 12:42:36 -------------------------------------------------------------------------------- Progress Note Details Patient Name: Date of Service: [name] Alert. 01/05/2022 10:00 A M Medical Record Number: [id number] Patient Account Number: [id number] Date of Birth/Sex: Treating RN: [birth year] (66 y.o. Benjaman Lobe Primary Care Provider: [place] Other Clinician: Referring Provider: Treating Provider/Extender: Kalman Shan [facility], Bethany Weeks in Treatment: 1 Subjective Chief Complaint Information obtained from  Patient 12/29/2021; bilateral thigh wounds History of Present Illness (HPI) Admission 12/29/2021 Ms. Kalissa Grays is a 66 year old female with a past medical history of uncontrolled type 2 diabetes on hemodialysis, OSA and chronic hep C that presents to the clinic for a 91-monthhistory of waxing and waning of ulcers to her lower extremities bilaterally. She states that her nephrologist has seen these wounds. She is not currently on sodium thiosulfate. She reports using Neosporin and a wound cleanser daily to the wound beds. She currently denies systemic signs of infection. 6/26; patient presents for follow-up. She states that she tried Medihoney but reports burning to the wound sites. She stopped this after using it a couple days. She has been keeping the wounds open to  air. She currently denies signs of infection. She declines debridement today. Patient History Information obtained from Chart. Family History Cancer - Mother, Diabetes - Paternal Grandparents,Siblings, Heart Disease - Maternal Grandparents, Hypertension - Siblings, Seizures - Child, No family history of Kidney Disease, Lung Disease, Stroke, Thyroid Problems, Tuberculosis. Social History Former smoker - quit 1982, Marital Status - Divorced, Alcohol Use - Never, Drug Use - Prior History, Caffeine Use - Rarely. Medical History Eyes Denies history of Cataracts, Glaucoma, Optic Neuritis Hematologic/Lymphatic Patient has history of Anemia Denies history of Hemophilia, Human Immunodeficiency Virus, Lymphedema, Sickle Cell Disease Respiratory Patient has history of Asthma Denies history of Aspiration, Chronic Obstructive Pulmonary Disease (COPD), Pneumothorax, Sleep Apnea, Tuberculosis Cardiovascular Patient has history of Congestive Heart Failure - EF 55-60%, Coronary Artery Disease, Hypertension, Myocardial Infarction Denies history of Angina, Arrhythmia, Deep Vein Thrombosis, Hypotension, Peripheral Arterial Disease, Peripheral  Venous Disease, Phlebitis, Vasculitis Gastrointestinal Patient has history of Hepatitis C Denies history of Cirrhosis , Colitis, Crohnoos, Hepatitis A, Hepatitis B Endocrine Patient has history of Type II Diabetes Denies history of Type I Diabetes Genitourinary Patient has history of End Stage Renal Disease - stage IV-dialysis Integumentary (Skin) Denies history of History of Burn Musculoskeletal Patient has history of Gout, Osteoarthritis Denies history of Rheumatoid Arthritis, Osteomyelitis Neurologic Patient has history of Neuropathy Denies history of Dementia, Quadriplegia, Paraplegia, Seizure Disorder Medical A Surgical History Notes nd Cardiovascular aortic stenosis Objective Constitutional respirations regular, non-labored and within target range for patient.. Vitals Time Taken: 10:23 AM, Height: 68 in, Weight: 269 lbs, BMI: 40.9, Temperature: 98 F, Pulse: 74 bpm, Respiratory Rate: 17 breaths/min, Blood Pressure: 160/74 mmHg, Capillary Blood Glucose: 98 mg/dl. Cardiovascular 2+ dorsalis pedis/posterior tibialis pulses. Psychiatric pleasant and cooperative. General Notes: Right thigh: T the lateral aspect there is 1 open wound with granulation tissue and nonviable tissue. She used to have 2 wounds in this area but o there is epithelization to the previous wound site. Left thigh: T the inner aspect there is an open wound with granulation tissue and nonviable tissue. No o surrounding signs of soft tissue infection to any of the wound beds. Integumentary (Hair, Skin) Wound #1 status is Open. Original cause of wound was Gradually Appeared. The date acquired was: 07/14/2021. The wound has been in treatment 1 weeks. The wound is located on the Right,Proximal,Lateral Upper Leg. The wound measures 1.6cm length x 1.8cm width x 0.1cm depth; 2.262cm^2 area and 0.226cm^3 volume. There is Fat Layer (Subcutaneous Tissue) exposed. There is no tunneling or undermining noted. There is a  medium amount of serosanguineous drainage noted. The wound margin is distinct with the outline attached to the wound base. There is small (1-33%) red, pink granulation within the wound bed. There is a large (67-100%) amount of necrotic tissue within the wound bed including Adherent Slough. Wound #2 status is Open. Original cause of wound was Gradually Appeared. The date acquired was: 07/14/2021. The wound has been in treatment 1 weeks. The wound is located on the Right,Distal,Lateral Upper Leg. The wound measures 0cm length x 0cm width x 0cm depth; 0cm^2 area and 0cm^3 volume. There is Fat Layer (Subcutaneous Tissue) exposed. There is a medium amount of serosanguineous drainage noted. The wound margin is distinct with the outline attached to the wound base. There is small (1-33%) pink granulation within the wound bed. There is a large (67-100%) amount of necrotic tissue within the wound bed including Adherent Slough. Wound #3 status is Open. Original cause of wound was Gradually Appeared. The date  acquired was: 07/14/2021. The wound has been in treatment 1 weeks. The wound is located on the Left,Medial,Posterior Upper Leg. The wound measures 1.4cm length x 2.5cm width x 0.2cm depth; 2.749cm^2 area and 0.55cm^3 volume. There is Fat Layer (Subcutaneous Tissue) exposed. There is no tunneling or undermining noted. There is a medium amount of serosanguineous drainage noted. The wound margin is distinct with the outline attached to the wound base. There is large (67-100%) red, pink granulation within the wound bed. There is a small (1-33%) amount of necrotic tissue within the wound bed including Adherent Slough. Assessment Active Problems ICD-10 Non-pressure chronic ulcer of right thigh with fat layer exposed Non-pressure chronic ulcer of left thigh with fat layer exposed End stage renal disease Other disorders of calcium metabolism Patient's right lower extremity wounds have improved in size and  appearance since last clinic visit. One of the wounds has healed. The left inner thigh wound is stable. No surrounding signs of infection. She declines debridement today. At this time I recommended Dakin's wet-to-dry dressings Since she is having trouble with Medihoney. Plan Follow-up Appointments: Return Appointment in 1 week. - 01/19/22 @ 1000 w/ Dr. Heber [place] and Allayne Butcher Room # 9 Other: - [facility] Bathing/ Shower/ Hygiene: May shower and wash wound with soap and water. - with dressing changes. The following medication(s) was prescribed: Dakin's Solution miscellaneous 0.125 % solution 1 moisten gauze for wet to dry dressings starting 01/05/2022 WOUND #1: - Upper Leg Wound Laterality: Right, Lateral, Proximal Cleanser: Soap and Water 1 x Per Day/30 Days Discharge Instructions: May shower and wash wound with dial antibacterial soap and water prior to dressing change. Cleanser: Wound Cleanser 1 x Per Day/30 Days Discharge Instructions: Cleanse the wound with wound cleanser prior to applying a clean dressing using gauze sponges, not tissue or cotton balls. Peri-Wound Care: Skin Prep 1 x Per Day/30 Days Discharge Instructions: Use skin prep as directed Prim Dressing: Dakin's Solution 0.25%, 16 (oz) 1 x Per Day/30 Days ary Discharge Instructions: Moisten gauze with Dakin's solution Secondary Dressing: Zetuvit Plus Silicone Border Dressing 4x4 (in/in) 1 x Per Day/30 Days Discharge Instructions: Apply silicone border over primary dressing as directed. WOUND #3: - Upper Leg Wound Laterality: Left, Medial, Posterior Cleanser: Soap and Water 1 x Per Day/30 Days Discharge Instructions: May shower and wash wound with dial antibacterial soap and water prior to dressing change. Cleanser: Wound Cleanser 1 x Per Day/30 Days Discharge Instructions: Cleanse the wound with wound cleanser prior to applying a clean dressing using gauze sponges, not tissue or cotton balls. Peri-Wound Care:  Skin Prep 1 x Per Day/30 Days Discharge Instructions: Use skin prep as directed Prim Dressing: Dakin's Solution 0.25%, 16 (oz) 1 x Per Day/30 Days ary Discharge Instructions: Moisten gauze with Dakin's solution Secondary Dressing: Zetuvit Plus Silicone Border Dressing 4x4 (in/in) 1 x Per Day/30 Days Discharge Instructions: Apply silicone border over primary dressing as directed. 1. Dakin's wet-to-dry 2. Follow-up in 2 weeks Electronic Signature(s) Signed: 01/05/2022 12:47:52 PM By: Kalman Shan DO Entered By: Kalman Shan on 01/05/2022 12:46:25 -------------------------------------------------------------------------------- HxROS Details Patient Name: Date of Service: [name] Alert. 01/05/2022 10:00 A M Medical Record Number: [id number] Patient Account Number: [id number] Date of Birth/Sex: Treating RN: [birth year] (66 y.o. Benjaman Lobe Primary Care Provider: [facility] Other Clinician: Referring Provider: Treating Provider/Extender: Jannette Spanner, Bethany Weeks in Treatment: 1 Information Obtained From Chart Eyes Medical History: Negative for: Cataracts; Glaucoma; Optic Neuritis Hematologic/Lymphatic Medical History: Positive for: Anemia  Negative for: Hemophilia; Human Immunodeficiency Virus; Lymphedema; Sickle Cell Disease Respiratory Medical History: Positive for: Asthma Negative for: Aspiration; Chronic Obstructive Pulmonary Disease (COPD); Pneumothorax; Sleep Apnea; Tuberculosis Cardiovascular Medical History: Positive for: Congestive Heart Failure - EF 55-60%; Coronary Artery Disease; Hypertension; Myocardial Infarction Negative for: Angina; Arrhythmia; Deep Vein Thrombosis; Hypotension; Peripheral Arterial Disease; Peripheral Venous Disease; Phlebitis; Vasculitis Past Medical History Notes: aortic stenosis Gastrointestinal Medical History: Positive for: Hepatitis C Negative for: Cirrhosis ; Colitis; Crohns; Hepatitis A; Hepatitis  B Endocrine Medical History: Positive for: Type II Diabetes Negative for: Type I Diabetes Time with diabetes: 30 years Treated with: Insulin Blood sugar tested every day: Yes Tested : twice Genitourinary Medical History: Positive for: End Stage Renal Disease - stage IV-dialysis Integumentary (Skin) Medical History: Negative for: History of Burn Musculoskeletal Medical History: Positive for: Gout; Osteoarthritis Negative for: Rheumatoid Arthritis; Osteomyelitis Neurologic Medical History: Positive for: Neuropathy Negative for: Dementia; Quadriplegia; Paraplegia; Seizure Disorder Immunizations Pneumococcal Vaccine: Received Pneumococcal Vaccination: Yes Received Pneumococcal Vaccination On or After 60th Birthday: Yes Implantable Devices None Family and Social History Cancer: Yes - Mother; Diabetes: Yes - Paternal Grandparents,Siblings; Heart Disease: Yes - Maternal Grandparents; Hypertension: Yes - Siblings; Kidney Disease: No; Lung Disease: No; Seizures: Yes - Child; Stroke: No; Thyroid Problems: No; Tuberculosis: No; Former smoker - quit 1982; Marital Status - Divorced; Alcohol Use: Never; Drug Use: Prior History; Caffeine Use: Rarely; Financial Concerns: No; Food, Clothing or Shelter Needs: No; Support System Lacking: No; Transportation Concerns: No Electronic Signature(s) Signed: 01/05/2022 12:47:52 PM By: Kalman Shan DO Signed: 01/07/2022 4:30:40 PM By: [name] RN Entered By: Kalman Shan on 01/05/2022 12:43:31 -------------------------------------------------------------------------------- SuperBill Details Patient Name: Date of Service: [name] Alert. 01/05/2022 Medical Record Number: [id number] Patient Account Number: [id number] Date of Birth/Sex: Treating RN: 1955/12/21 (66 y.o. Benjaman Lobe Primary Care Provider: [facility] [place] Other Clinician: Referring Provider: Treating Provider/Extender: Kalman Shan [facility], Bethany Weeks  in Treatment: 1 Diagnosis Coding ICD-10 Codes Code Description [phone] Non-pressure chronic ulcer of right thigh with fat layer exposed L97.122 Non-pressure chronic ulcer of left thigh with fat layer exposed N18.6 End stage renal disease E83.59 Other disorders of calcium metabolism Facility Procedures CPT4 Code: [id number] Description: 99214 - WOUND CARE VISIT-LEV 4 EST PT Modifier: Quantity: 1 Physician Procedures : CPT4 Code Description Modifier [id number] 99213 - WC PHYS LEVEL 3 - EST PT ICD-10 Diagnosis Description Q00.867 Non-pressure chronic ulcer of right thigh with fat layer exposed L97.122 Non-pressure chronic ulcer of left thigh with fat layer exposed N18.6  End stage renal disease E83.59 Other disorders of calcium metabolism Quantity: 1 Electronic Signature(s) Signed: 01/07/2022 4:30:40 PM By: [name] RN Signed: 01/08/2022 10:33:21 AM By: Kalman Shan DO Previous Signature: 01/05/2022 12:47:52 PM Version By: Kalman Shan DO Entered By: [name] on 01/07/2022 11:20:37

## 2022-01-19 ENCOUNTER — Ambulatory Visit (HOSPITAL_BASED_OUTPATIENT_CLINIC_OR_DEPARTMENT_OTHER): Payer: Medicare Other | Admitting: Internal Medicine

## 2022-01-20 ENCOUNTER — Telehealth: Payer: Self-pay

## 2022-01-20 NOTE — Telephone Encounter (Signed)
Attempted to contact the patient in regards to rescheduling with another provider, LVM for a call back. 

## 2022-02-07 IMAGING — CR DG FOOT 2V*R*
2 series · 2 of 2 positions shown · non-contrast
Comparison: 02/17/2018, [DATE]

CLINICAL DATA: Right great toe pain for 1 week

EXAM:
RIGHT FOOT - 2 VIEW

[x foot ap right]
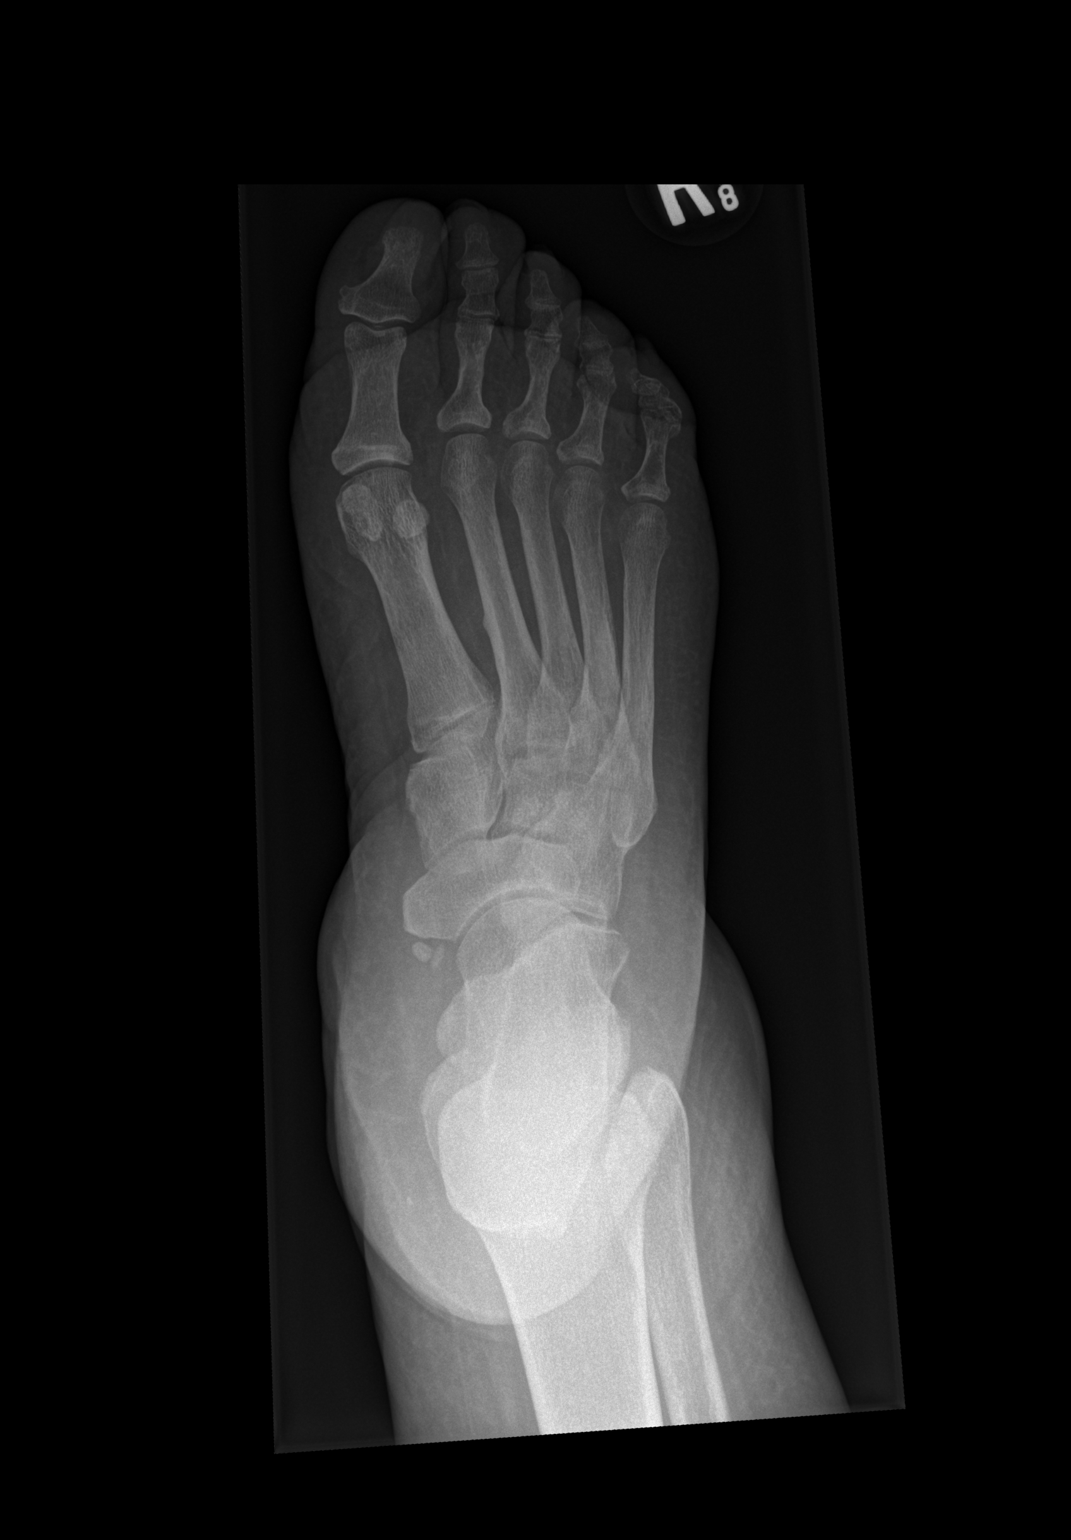

[x foot lat right]
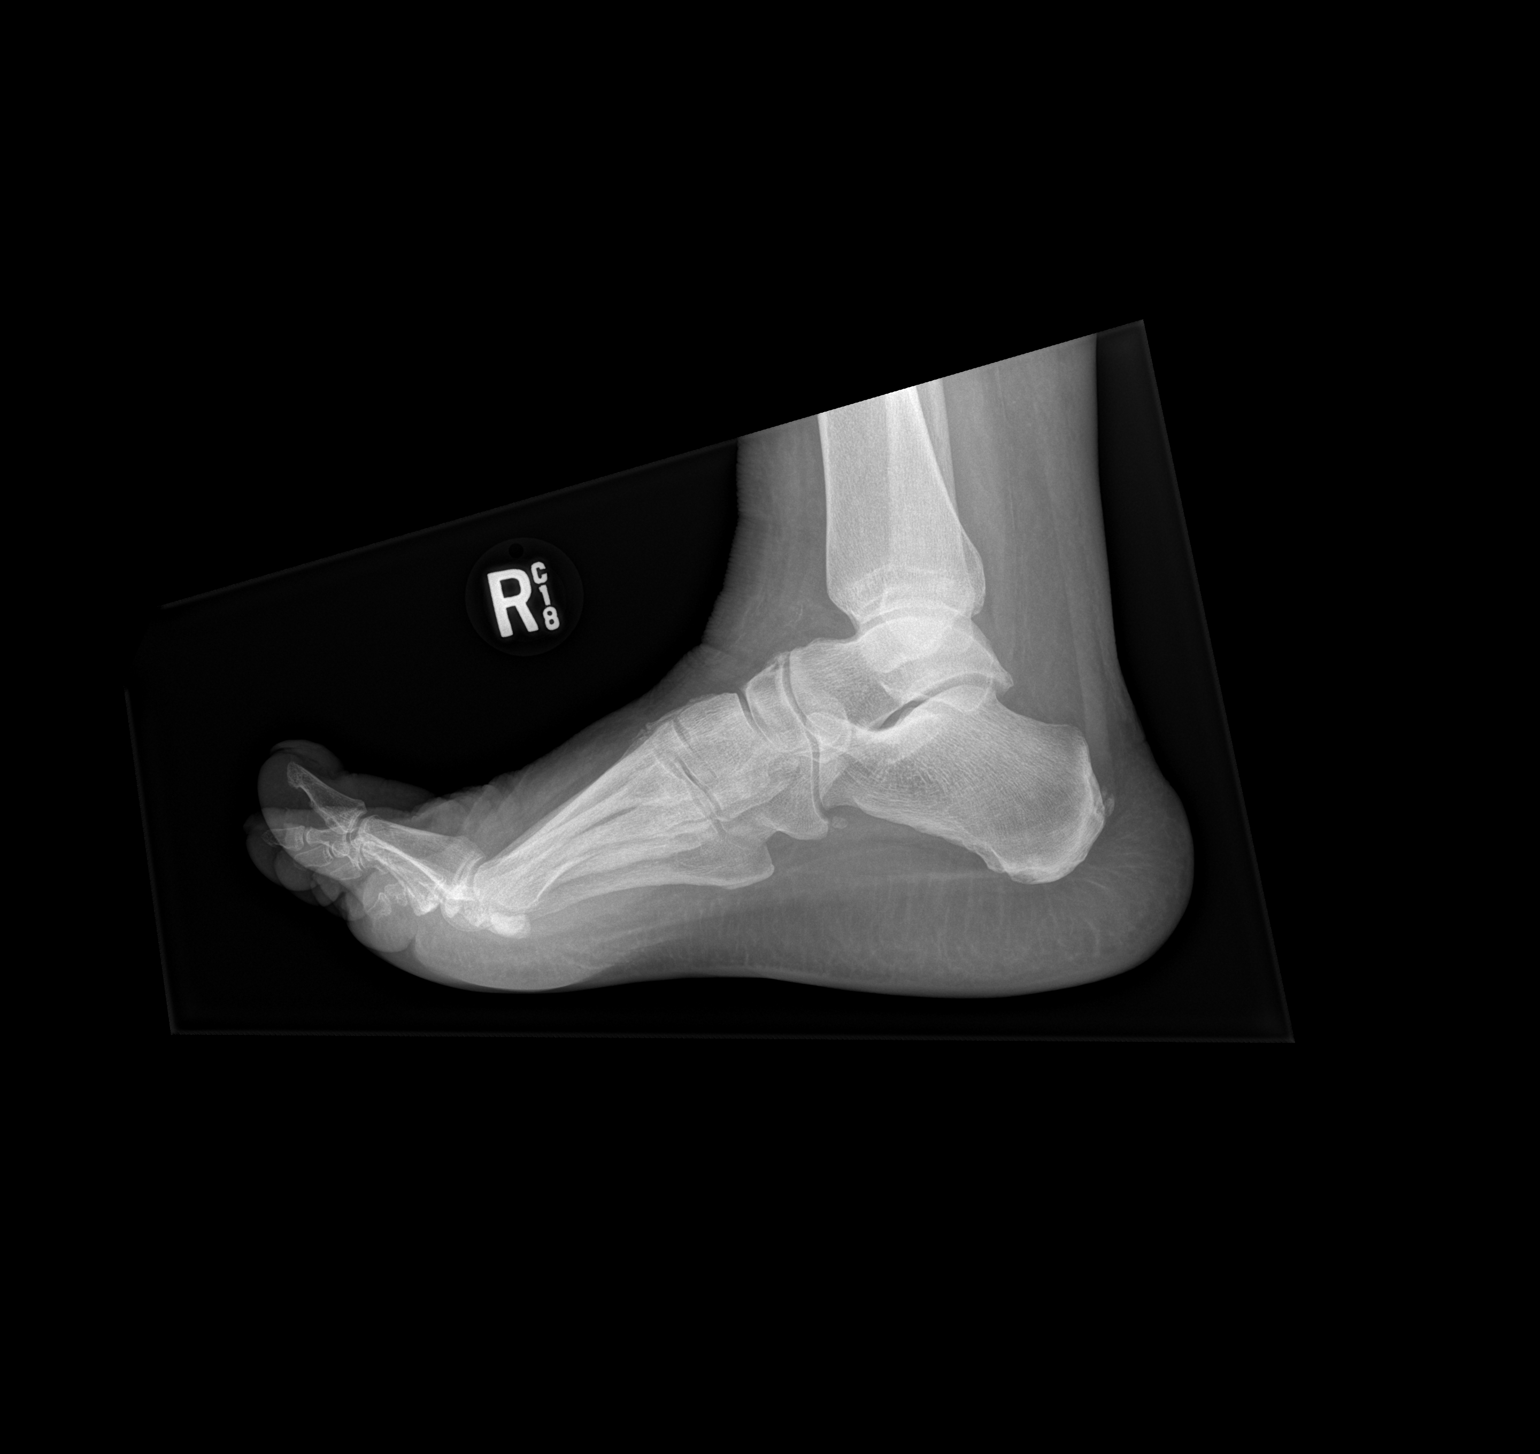

[2 of 2 positions shown; findings below may reference images not displayed]

FINDINGS: There is no evidence of acute fracture or dislocation. Unchanged
chronic deformity at the fifth PIP joint. There is no evidence of
arthropathy or other focal bone abnormality. Soft tissues are
unremarkable.
IMPRESSION: Negative.

## 2022-02-07 IMAGING — CR DG CHEST 2V
2 series · 2 of 2 positions shown · non-contrast
Comparison: 12/29/2018

CLINICAL DATA: Shortness of breath

EXAM:
CHEST - 2 VIEW

[w chest pa]
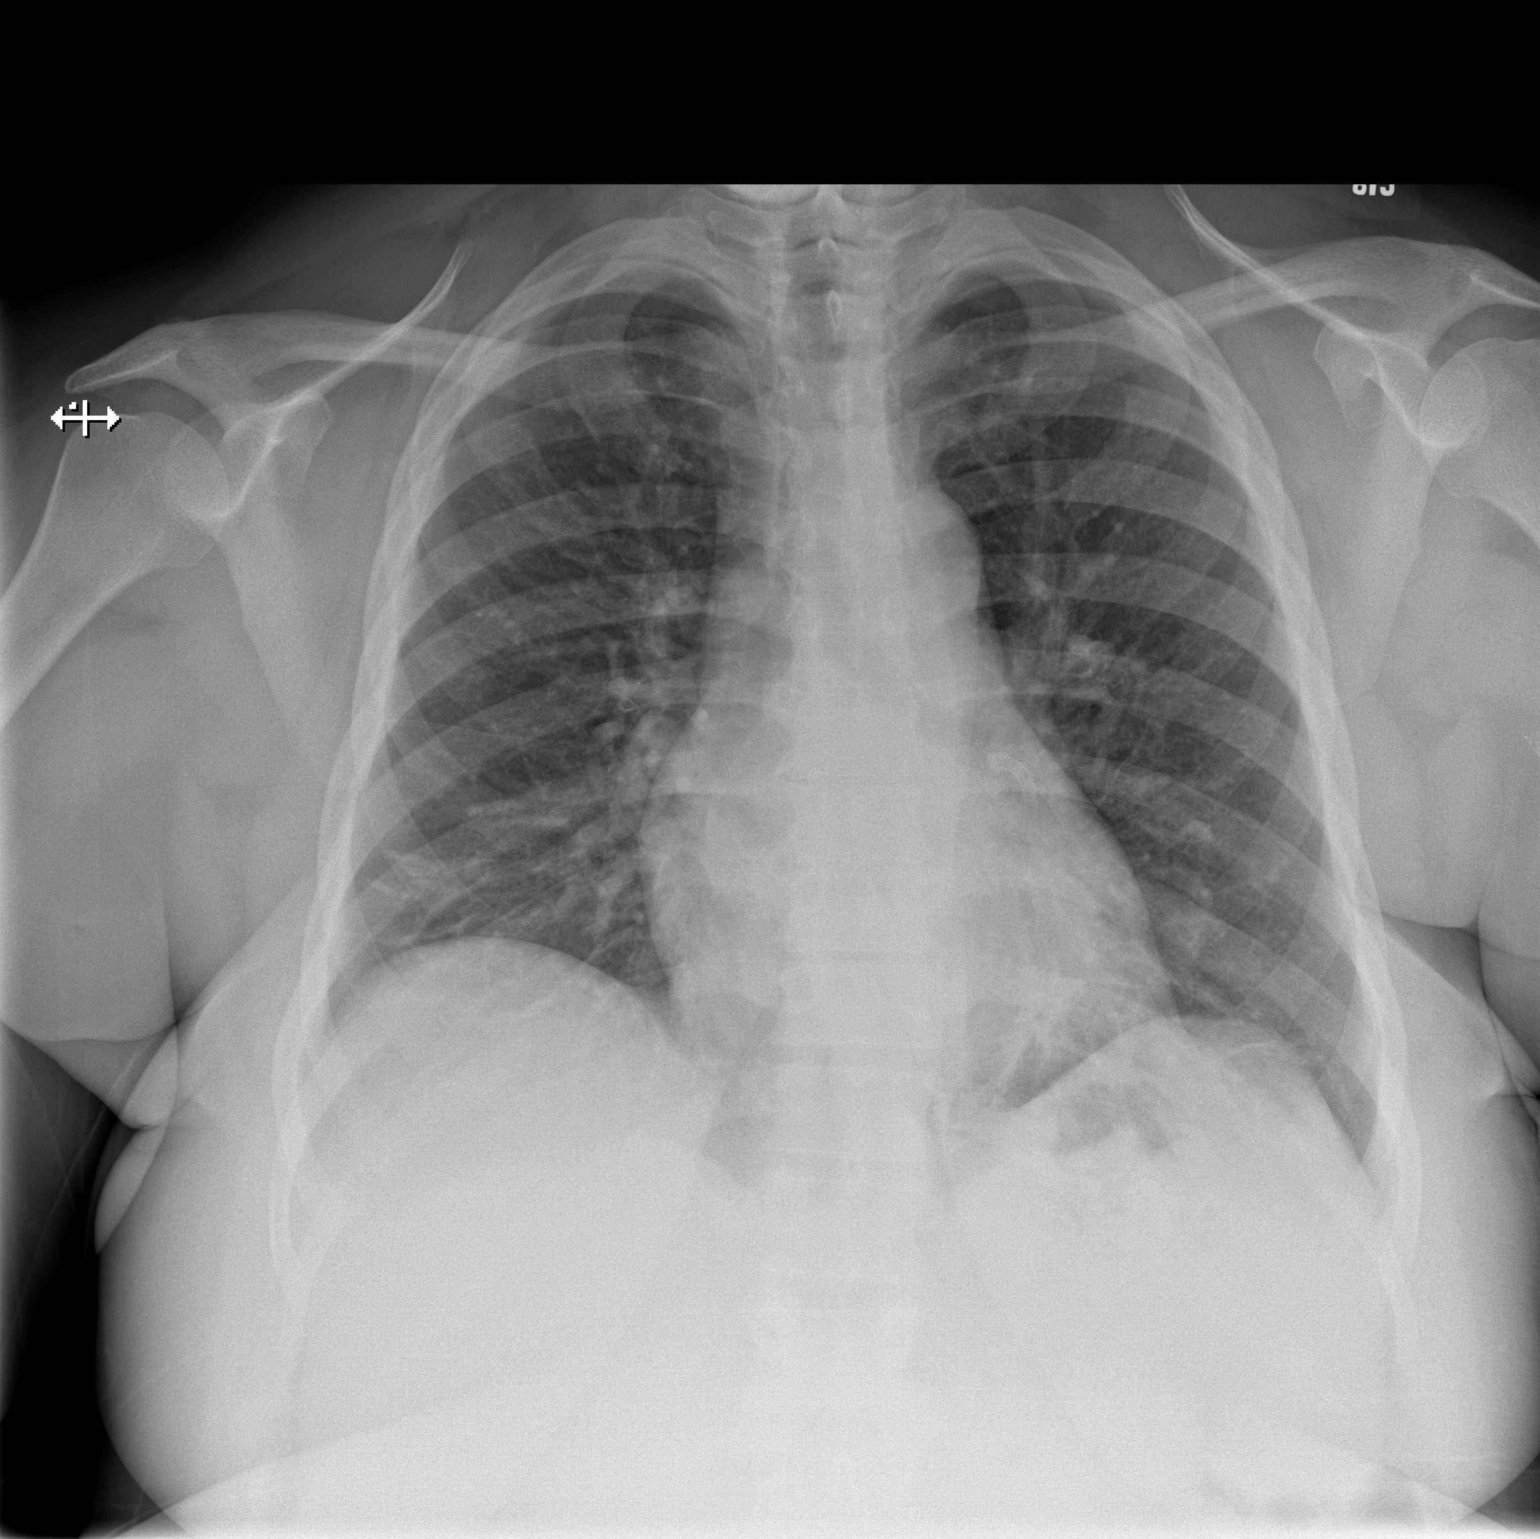

[w chest lat]
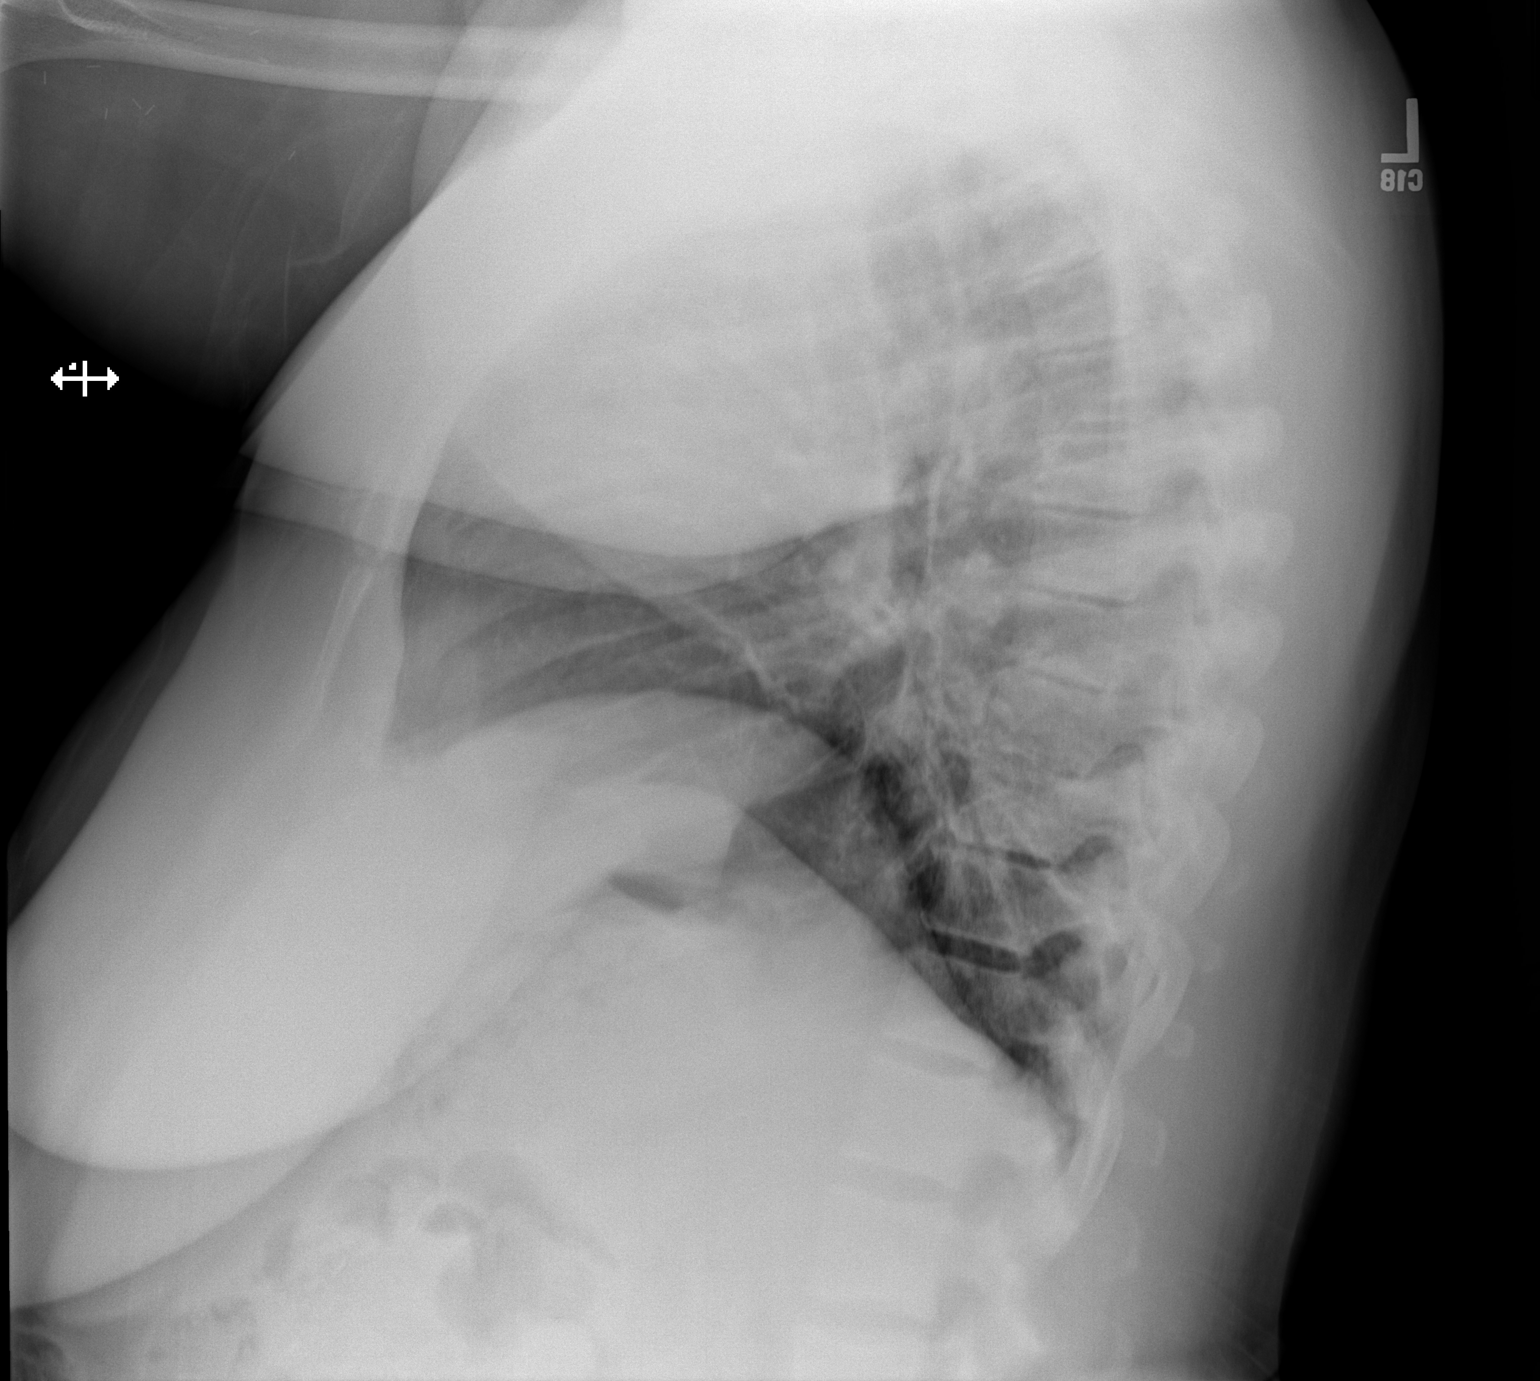

[2 of 2 positions shown; findings below may reference images not displayed]

FINDINGS: Interval removal of hemodialysis catheter. The heart size and
mediastinal contours are within normal limits. Mild streaky
bibasilar opacities, right greater than left. No pleural effusion or
pneumothorax. The visualized skeletal structures are unremarkable.
IMPRESSION: Mild streaky bibasilar opacities, right greater than left, may
represent atelectasis or infiltrate.

## 2022-02-13 ENCOUNTER — Encounter (HOSPITAL_BASED_OUTPATIENT_CLINIC_OR_DEPARTMENT_OTHER): Payer: Medicare Other | Attending: Internal Medicine | Admitting: Internal Medicine

## 2022-02-13 NOTE — Progress Notes (Signed)
SHERRILYN, NAIRN ([id number]) Visit Report for 01/05/2022 Arrival Information Details Patient Name: Date of Service: [name] Alert. 01/05/2022 10:00 A M Medical Record Number: [id number] Patient Account Number: [id number] Date of Birth/Sex: Treating RN: 21-Mar-1956 (66 y.o. Benjaman Lobe Primary Care Sourish Allender: [place] [place] Other Clinician: Referring Malay Fantroy: Treating Cheick Suhr/Extender: [facility], [place] Weeks in Treatment: 1 Visit Information History Since Last Visit Added or deleted any medications: No Patient Arrived: Kasandra Knudsen Any new allergies or adverse reactions: No Arrival Time: 10:23 Had a fall or experienced change in No Accompanied By: self activities of daily living that may affect Transfer Assistance: Manual risk of falls: Patient Identification Verified: Yes Signs or symptoms of abuse/neglect since last visito No Secondary Verification Process Completed: Yes Hospitalized since last visit: No Patient Requires Transmission-Based No Implantable device outside of the clinic excluding No Precautions: cellular tissue based products placed in the center Patient Has Alerts: Yes since last visit: Patient Alerts: Patient on Blood Thinner Has Dressing in Place as Prescribed: Yes Patient Refuses Blood Pain Present Now: Yes Products Electronic Signature(s) Signed: 01/07/2022 4:30:40 PM By: [name] RN Entered By: [name] on 01/05/2022 10:23:49 -------------------------------------------------------------------------------- Clinic Level of Care Assessment Details Patient Name: Date of Service: [name] Alert. 01/05/2022 10:00 A M Medical Record Number: [id number] Patient Account Number: [id number] Date of Birth/Sex: Treating RN: 1955/09/23 (66 y.o. Benjaman Lobe Primary Care Melita Villalona: [place] [place] Other Clinician: Referring Karell Tukes: Treating Khanh Cordner/Extender: [facility], [place] We[place] in Treatment: 1 Clinic  Level of Care Assessment Items TOOL 4 Quantity Score X- 1 0 Use when only an EandM is performed on FOLLOW-UP visit ASSESSMENTS - Nursing Assessment / Reassessment X- 1 10 Reassessment of Co-morbidities (includes updates in patient status) X- 1 5 Reassessment of Adherence to Treatment Plan ASSESSMENTS - Wound and Skin A ssessment / Reassessment '[]'$  - 0 Simple Wound Assessment / Reassessment - one wound X- 2 5 Complex Wound Assessment / Reassessment - multiple wounds '[]'$  - 0 Dermatologic / Skin Assessment (not related to wound area) ASSESSMENTS - Focused Assessment X- 1 5 Circumferential Edema Measurements - multi extremities '[]'$  - 0 Nutritional Assessment / Counseling / Intervention '[]'$  - 0 Lower Extremity Assessment (monofilament, tuning fork, pulses) '[]'$  - 0 Peripheral Arterial Disease Assessment (using hand held doppler) ASSESSMENTS - Ostomy and/or Continence Assessment and Care '[]'$  - 0 Incontinence Assessment and Management '[]'$  - 0 Ostomy Care Assessment and Management (repouching, etc.) PROCESS - Coordination of Care '[]'$  - 0 Simple Patient / Family Education for ongoing care X- 1 20 Complex (extensive) Patient / Family Education for ongoing care X- 1 10 Staff obtains Programmer, systems, Records, T Results / Process Orders est '[]'$  - 0 Staff telephones HHA, Nursing Homes / Clarify orders / etc '[]'$  - 0 Routine Transfer to another Facility (non-emergent condition) '[]'$  - 0 Routine Hospital Admission (non-emergent condition) '[]'$  - 0 New Admissions / Biomedical engineer / Ordering NPWT Apligraf, etc. , '[]'$  - 0 Emergency Hospital Admission (emergent condition) '[]'$  - 0 Simple Discharge Coordination X- 1 15 Complex (extensive) Discharge Coordination PROCESS - Special Needs '[]'$  - 0 Pediatric / Minor Patient Management '[]'$  - 0 Isolation Patient Management '[]'$  - 0 Hearing / Language / Visual special needs '[]'$  - 0 Assessment of Community assistance (transportation, D/C planning, etc.) '[]'$   - 0 Additional assistance / Altered mentation '[]'$  - 0 Support Surface(s) Assessment (bed, cushion, seat, etc.) INTERVENTIONS - Wound Cleansing / Measurement '[]'$  - 0 Simple Wound Cleansing - one  wound X- 2 5 Complex Wound Cleansing - multiple wounds X- 1 5 Wound Imaging (photographs - any number of wounds) '[]'$  - 0 Wound Tracing (instead of photographs) '[]'$  - 0 Simple Wound Measurement - one wound X- 2 5 Complex Wound Measurement - multiple wounds INTERVENTIONS - Wound Dressings '[]'$  - 0 Small Wound Dressing one or multiple wounds X- 2 15 Medium Wound Dressing one or multiple wounds '[]'$  - 0 Large Wound Dressing one or multiple wounds X- 1 5 Application of Medications - topical '[]'$  - 0 Application of Medications - injection INTERVENTIONS - Miscellaneous '[]'$  - 0 External ear exam '[]'$  - 0 Specimen Collection (cultures, biopsies, blood, body fluids, etc.) '[]'$  - 0 Specimen(s) / Culture(s) sent or taken to Lab for analysis '[]'$  - 0 Patient Transfer (multiple staff / Civil Service fast streamer / Similar devices) '[]'$  - 0 Simple Staple / Suture removal (25 or less) '[]'$  - 0 Complex Staple / Suture removal (26 or more) '[]'$  - 0 Hypo / Hyperglycemic Management (close monitor of Blood Glucose) '[]'$  - 0 Ankle / Brachial Index (ABI) - do not check if billed separately X- 1 5 Vital Signs Has the patient been seen at the hospital within the last three years: Yes Total Score: 140 Level Of Care: New/Established - Level 4 Electronic Signature(s) Signed: 01/07/2022 4:30:40 PM By: [name] RN Entered By: [name] on 01/07/2022 11:18:18 -------------------------------------------------------------------------------- Lower Extremity Assessment Details Patient Name: Date of Service: [name] Alert. 01/05/2022 10:00 A M Medical Record Number: [id number] Patient Account Number: [id number] Date of Birth/Sex: Treating RN: 17-Mar-1956 (66 y.o. Benjaman Lobe Primary Care Tamerra Merkley: [place] [place] Other  Clinician: Referring Locke Barrell: Treating Bryony Kaman/Extender: Jannette Spanner, [place] We[place] in Treatment: 1 Electronic Signature(s) Signed: 01/07/2022 4:30:40 PM By: [name] RN Entered By: [name] on 01/05/2022 10:24:36 -------------------------------------------------------------------------------- Multi Wound Chart Details Patient Name: Date of Service: [name] Alert. 01/05/2022 10:00 A M Medical Record Number: [id number] Patient Account Number: [id number] Date of Birth/Sex: Treating RN: 1955/10/31 (66 y.o. Benjaman Lobe Primary Care Isaia Hassell: [place] [place] Other Clinician: Referring Sandie Swayze: Treating Anaiz Qazi/Extender: [facility], [place] We[place] in Treatment: 1 Vital Signs Height(in): 68 Capillary Blood Glucose(mg/dl): 98 Weight(lbs): 269 Pulse(bpm): 18 Body Mass Index(BMI): 40.9 Blood Pressure(mmHg): 160/74 Temperature(F): 98 Respiratory Rate(breaths/min): 17 Photos: Right, Proximal, Lateral Upper Leg Right, Distal, Lateral Upper Leg Left, Medial, Posterior Upper Leg Wound Location: Gradually Appeared Gradually Appeared Gradually Appeared Wounding Event: Calciphylaxis Calciphylaxis Calciphylaxis Primary Etiology: Anemia, Asthma, Congestive Heart Anemia, Asthma, Congestive Heart Anemia, Asthma, Congestive Heart Comorbid History: Failure, Coronary Artery Disease, Failure, Coronary Artery Disease, Failure, Coronary Artery Disease, Hypertension, Myocardial Infarction, Hypertension, Myocardial Infarction, Hypertension, Myocardial Infarction, Hepatitis C, Type II Diabetes, End Hepatitis C, Type II Diabetes, End Hepatitis C, Type II Diabetes, End Stage Renal Disease, Gout, Stage Renal Disease, Gout, Stage Renal Disease, Gout, Osteoarthritis, Neuropathy Osteoarthritis, Neuropathy Osteoarthritis, Neuropathy 07/14/2021 07/14/2021 07/14/2021 Date Acquired: '1 1 1 '$ Weeks of Treatment: Open Open Open Wound Status: No No No Wound  Recurrence: 1.6x1.8x0.1 0x0x0 1.4x2.5x0.2 Measurements L x W x D (cm) 2.262 0 2.749 A (cm) : rea 0.226 0 0.55 Volume (cm) : 42.40% 100.00% -75.00% % Reduction in Area: 42.50% 100.00% -75.20% % Reduction in Volume: Full Thickness Without Exposed Full Thickness Without Exposed Full Thickness Without Exposed Classification: Support Structures Support Structures Support Structures Medium Medium Medium Exudate Amount: Serosanguineous Serosanguineous Serosanguineous Exudate Type: red, brown red, brown red, brown Exudate Color: Distinct, outline attached Distinct, outline attached Distinct, outline  attached Wound Margin: Small (1-33%) Small (1-33%) Large (67-100%) Granulation Amount: Red, Pink Pink Red, Pink Granulation Quality: Large (67-100%) Large (67-100%) Small (1-33%) Necrotic Amount: Fat Layer (Subcutaneous Tissue): Yes Fat Layer (Subcutaneous Tissue): Yes Fat Layer (Subcutaneous Tissue): Yes Exposed Structures: Fascia: No Fascia: No Fascia: No Tendon: No Tendon: No Tendon: No Muscle: No Muscle: No Muscle: No Joint: No Joint: No Joint: No Bone: No Bone: No Bone: No Small (1-33%) Small (1-33%) Small (1-33%) Epithelialization: Treatment Notes Electronic Signature(s) Signed: 01/05/2022 12:47:52 PM By: Kalman Shan DO Signed: 01/07/2022 4:30:40 PM By: [name] RN Entered By: Kalman Shan on 01/05/2022 12:42:40 -------------------------------------------------------------------------------- Multi-Disciplinary Care Plan Details Patient Name: Date of Service: Angelina Pih M. 01/05/2022 10:00 A M Medical Record Number: [id number] Patient Account Number: [id number] Date of Birth/Sex: Treating RN: 13-May-1956 (66 y.o. Benjaman Lobe Primary Care Demetrick Eichenberger: [place] [place] Other Clinician: Referring Kalik Hoare: Treating Tatianna Ibbotson/Extender: Jannette Spanner, [place] Weeks in Treatment: 1 Active Inactive Electronic Signature(s) Signed:  02/13/2022 2:04:28 PM By: [name] RN Previous Signature: 01/07/2022 4:30:40 PM Version By: [name] RN Entered By: [name] on 02/13/2022 10:37:49 -------------------------------------------------------------------------------- Pain Assessment Details Patient Name: Date of Service: [name] Alert. 01/05/2022 10:00 A M Medical Record Number: [id number] Patient Account Number: [id number] Date of Birth/Sex: Treating RN: [birth year] (66 y.o. Benjaman Lobe Primary Care Greely Atiyeh: [place] Other Clinician: Referring Joscelynn Brutus: Treating Anaya Bovee/Extender: [facility], [place] We[place] in Treatment: 1 Active Problems Location of Pain Severity and Description of Pain Patient Has Paino Yes Site Locations Pain Location: Generalized Pain, Pain in Ulcers With Dressing Change: Yes Duration of the Pain. Constant / Intermittento Constant Rate the pain. Current Pain Level: 10 Worst Pain Level: 10 Least Pain Level: 0 Tolerable Pain Level: 10 Character of Pain Describe the Pain: Aching Pain Management and Medication Current Pain Management: Medication: No Cold Application: No Rest: No Massage: No Activity: No T.E.N.S.: No Heat Application: No Leg drop or elevation: No Is the Current Pain Management Adequate: Adequate How does your wound impact your activities of daily livingo Sleep: No Bathing: No Appetite: No Relationship With Others: No Bladder Continence: No Emotions: No Bowel Continence: No Work: No Toileting: No Drive: No Dressing: No Hobbies: No Electronic Signature(s) Signed: 01/07/2022 4:30:40 PM By: [name] RN Entered By: [name] on 01/05/2022 10:24:30 -------------------------------------------------------------------------------- Patient/Caregiver Education Details Patient Name: Date of Service: [name] Alert 6/26/2023andnbsp10:00 A M Medical Record Number: [id number] Patient Account Number:  [id number] Date of Birth/Gender: Treating RN: Jan 18, 1956 (67 y.o. Benjaman Lobe Primary Care Physician: [place] [place] Other Clinician: Referring Physician: Treating Physician/Extender: [facility], [place] in Treatment: 1 Education Assessment Education Provided To: Patient Education Topics Provided Wound/Skin Impairment: Methods: Explain/Verbal Responses: State content correctly Electronic Signature(s) Signed: 01/07/2022 4:30:40 PM By: [name] RN Entered By: [name] on 01/05/2022 10:40:52 -------------------------------------------------------------------------------- Wound Assessment Details Patient Name: Date of Service: [name] Alert. 01/05/2022 10:00 A M Medical Record Number: [id number] Patient Account Number: [id number] Date of Birth/Sex: Treating RN: 1955/08/16 (66 y.o. Benjaman Lobe Primary Care Luca Burston: [place] [place] Other Clinician: Referring Marielis Samara: Treating Izaak Sahr/Extender: [facility], [place] We[place] in Treatment: 1 Wound Status Wound Number: 1 Primary Calciphylaxis Etiology: Wound Location: Right, Proximal, Lateral Upper Leg Wound Open Wounding Event: Gradually Appeared Status: Date Acquired: 07/14/2021 Comorbid Anemia, Asthma, Congestive Heart Failure, Coronary Artery Weeks Of Treatment: 1 History: Disease, Hypertension, Myocardial Infarction, Hepatitis C, Type II Clustered Wound: No Diabetes, End Stage Renal  Disease, Gout, Osteoarthritis, Neuropathy Photos Wound Measurements Length: (cm) 1.6 Width: (cm) 1.8 Depth: (cm) 0.1 Area: (cm) 2.262 Volume: (cm) 0.226 % Reduction in Area: 42.4% % Reduction in Volume: 42.5% Epithelialization: Small (1-33%) Tunneling: No Undermining: No Wound Description Classification: Full Thickness Without Exposed Support Structures Wound Margin: Distinct, outline attached Exudate Amount: Medium Exudate Type: Serosanguineous Exudate Color: red,  brown Foul Odor After Cleansing: No Slough/Fibrino Yes Wound Bed Granulation Amount: Small (1-33%) Exposed Structure Granulation Quality: Red, Pink Fascia Exposed: No Necrotic Amount: Large (67-100%) Fat Layer (Subcutaneous Tissue) Exposed: Yes Necrotic Quality: Adherent Slough Tendon Exposed: No Muscle Exposed: No Joint Exposed: No Bone Exposed: No Electronic Signature(s) Signed: 01/07/2022 4:30:40 PM By: [name] RN Entered By: [name] on 01/05/2022 10:34:36 -------------------------------------------------------------------------------- Wound Assessment Details Patient Name: Date of Service: [name] Alert. 01/05/2022 10:00 A M Medical Record Number: [id number] Patient Account Number: [id number] Date of Birth/Sex: Treating RN: 1955-12-31 (66 y.o. Benjaman Lobe Primary Care Marveline Profeta: [place] [place] Other Clinician: Referring Shervon Kerwin: Treating Caryn Gienger/Extender: [facility], [place] We[place] in Treatment: 1 Wound Status Wound Number: 2 Primary Calciphylaxis Etiology: Wound Location: Right, Distal, Lateral Upper Leg Wound Open Wounding Event: Gradually Appeared Status: Date Acquired: 07/14/2021 Comorbid Anemia, Asthma, Congestive Heart Failure, Coronary Artery Weeks Of Treatment: 1 History: Disease, Hypertension, Myocardial Infarction, Hepatitis C, Type II Clustered Wound: No Diabetes, End Stage Renal Disease, Gout, Osteoarthritis, Neuropathy Photos Wound Measurements Length: (cm) Width: (cm) Depth: (cm) Area: (cm) Volume: (cm) 0 % Reduction in Area: 100% 0 % Reduction in Volume: 100% 0 Epithelialization: Small (1-33%) 0 0 Wound Description Classification: Full Thickness Without Exposed Support Structures Wound Margin: Distinct, outline attached Exudate Amount: Medium Exudate Type: Serosanguineous Exudate Color: red, brown Foul Odor After Cleansing: No Slough/Fibrino Yes Wound Bed Granulation Amount: Small (1-33%)  Exposed Structure Granulation Quality: Pink Fascia Exposed: No Necrotic Amount: Large (67-100%) Fat Layer (Subcutaneous Tissue) Exposed: Yes Necrotic Quality: Adherent Slough Tendon Exposed: No Muscle Exposed: No Joint Exposed: No Bone Exposed: No Electronic Signature(s) Signed: 01/07/2022 4:30:40 PM By: [name] RN Entered By: [name] on 01/05/2022 10:34:15 -------------------------------------------------------------------------------- Wound Assessment Details Patient Name: Date of Service: [name] Alert. 01/05/2022 10:00 A M Medical Record Number: [id number] Patient Account Number: [id number] Date of Birth/Sex: Treating RN: 1955-10-16 (66 y.o. Benjaman Lobe Primary Care Arvis Miguez: [place] [place] Other Clinician: Referring Stephane Niemann: Treating Carnelia Oscar/Extender: [facility], [place] We[place] in Treatment: 1 Wound Status Wound Number: 3 Primary Calciphylaxis Etiology: Wound Location: Left, Medial, Posterior Upper Leg Wound Open Wounding Event: Gradually Appeared Status: Date Acquired: 07/14/2021 Comorbid Anemia, Asthma, Congestive Heart Failure, Coronary Artery Weeks Of Treatment: 1 History: Disease, Hypertension, Myocardial Infarction, Hepatitis C, Type II Clustered Wound: No Diabetes, End Stage Renal Disease, Gout, Osteoarthritis, Neuropathy Photos Wound Measurements Length: (cm) 1.4 Width: (cm) 2.5 Depth: (cm) 0.2 Area: (cm) 2.749 Volume: (cm) 0.55 % Reduction in Area: -75% % Reduction in Volume: -75.2% Epithelialization: Small (1-33%) Tunneling: No Undermining: No Wound Description Classification: Full Thickness Without Exposed Support Structures Wound Margin: Distinct, outline attached Exudate Amount: Medium Exudate Type: Serosanguineous Exudate Color: red, brown Foul Odor After Cleansing: No Slough/Fibrino Yes Wound Bed Granulation Amount: Large (67-100%) Exposed Structure Granulation Quality: Red, Pink Fascia  Exposed: No Necrotic Amount: Small (1-33%) Fat Layer (Subcutaneous Tissue) Exposed: Yes Necrotic Quality: Adherent Slough Tendon Exposed: No Muscle Exposed: No Joint Exposed: No Bone Exposed: No Electronic Signature(s) Signed: 01/07/2022 4:30:40 PM By: [name] RN Entered By: [name] on 01/05/2022 10:35:01 --------------------------------------------------------------------------------  Vitals Details Patient Name: Date of Service: [name] Alert. 01/05/2022 10:00 A M Medical Record Number: [id number] Patient Account Number: [id number] Date of Birth/Sex: Treating RN: Mar 04, 1956 (66 y.o. Benjaman Lobe Primary Care Amia Rynders: [place] [place] Other Clinician: Referring Atonya Templer: Treating Neeti Knudtson/Extender: [facility], [place] Weeks in Treatment: 1 Vital Signs Time Taken: 10:23 Temperature (F): 98 Height (in): 68 Pulse (bpm): 74 Weight (lbs): 269 Respiratory Rate (breaths/min): 17 Body Mass Index (BMI): 40.9 Blood Pressure (mmHg): 160/74 Capillary Blood Glucose (mg/dl): 98 Reference Range: 80 - 120 mg / dl Electronic Signature(s) Signed: 01/07/2022 4:30:40 PM By: [name] RN Entered By: [name] on 01/05/2022 10:24:08

## 2022-02-17 IMAGING — CR DG CHEST 1V
1 series · 1 of 1 positions shown · non-contrast
Comparison: October 21, 2019.

CLINICAL DATA: Shortness of breath, fall.

EXAM:
CHEST  1 VIEW

[chest ap]
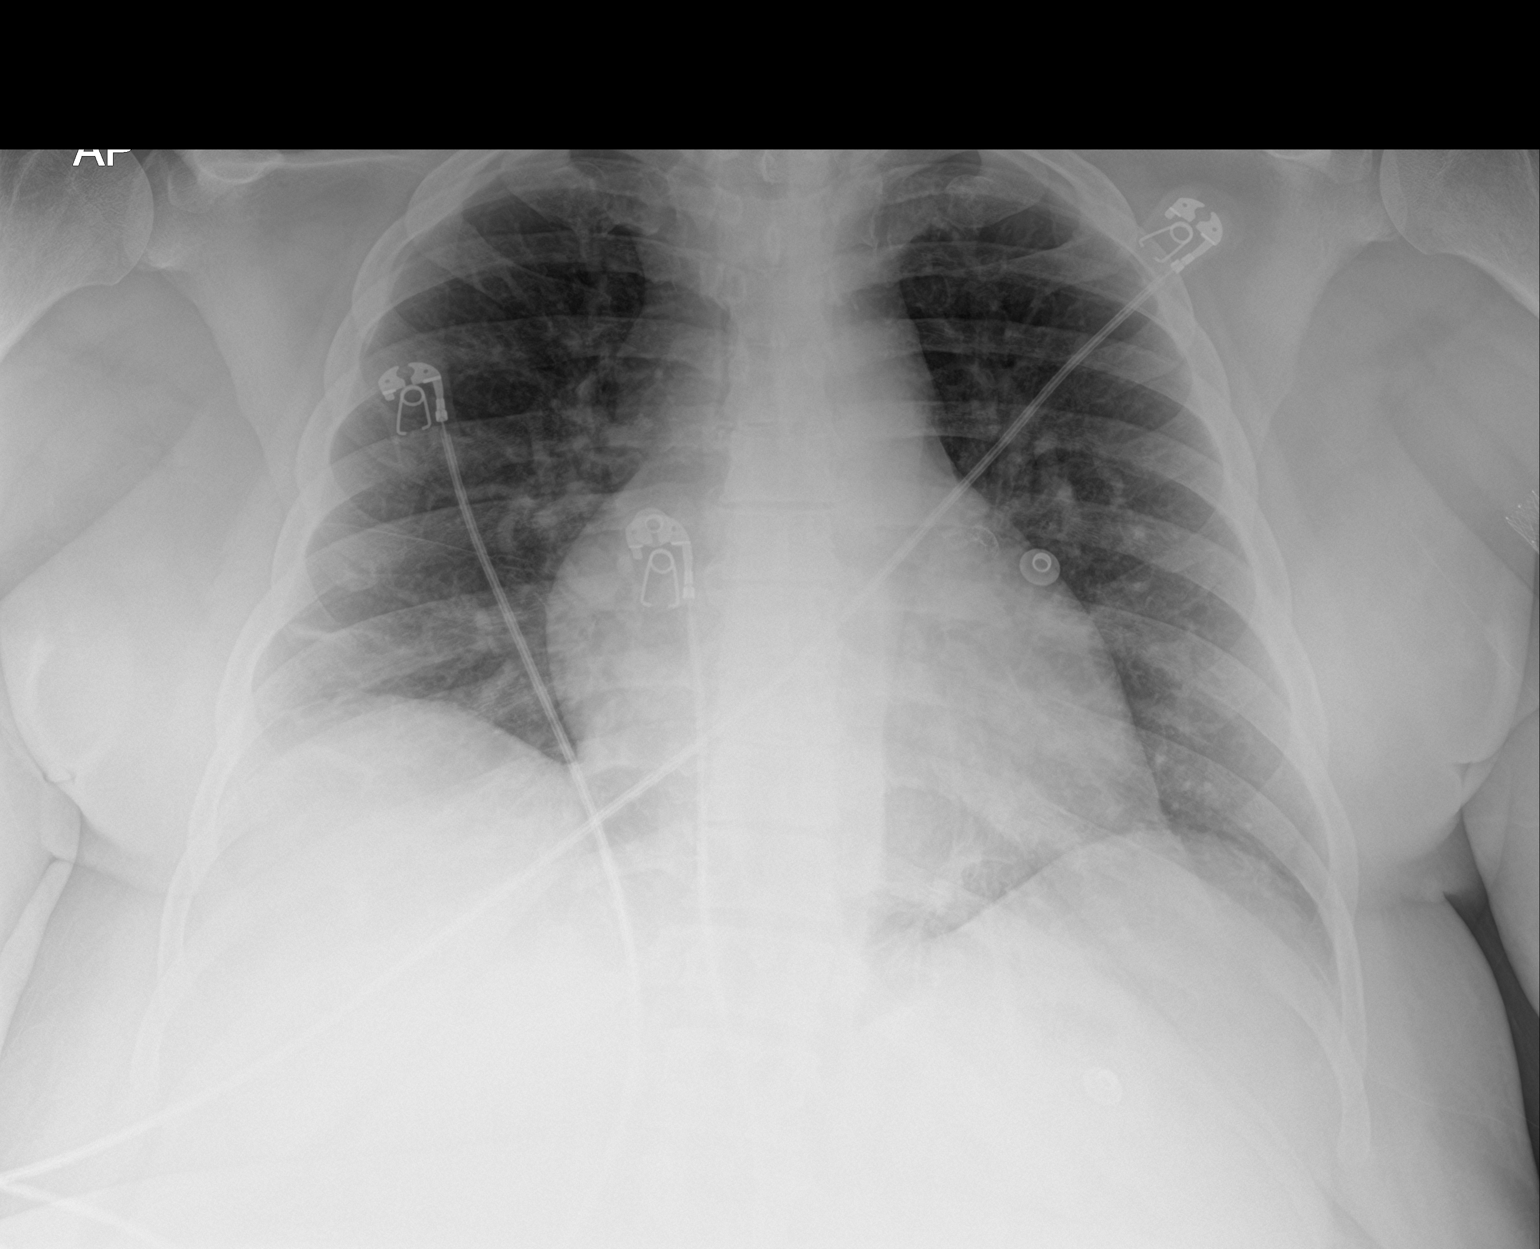

[1 of 1 positions shown; findings below may reference images not displayed]

FINDINGS: Stable cardiomediastinal silhouette. No pneumothorax or pleural
effusion is noted. Left lung is clear. Minimal right basilar
subsegmental atelectasis scarring is noted. The visualized skeletal
structures are unremarkable.
IMPRESSION: No active disease.

## 2022-04-01 IMAGING — CT CT ABD-PELV W/O CM
2 of 4 series · 16 of 46 positions shown, 18 images · non-contrast
Comparison: 06/29/2019

CLINICAL DATA: Nausea, vomiting, right side abdominal pain, right
flank pain

EXAM:
CT ABDOMEN AND PELVIS WITHOUT CONTRAST
TECHNIQUE: Multidetector CT imaging of the abdomen and pelvis was performed
following the standard protocol without IV contrast.

[Series 2: routine abd/pel wo · axial · 0.84mm/px · z∈[-700,-276]mm · 13 of 95 slices shown, 15 images]
[im 5/95  soft-tissue]
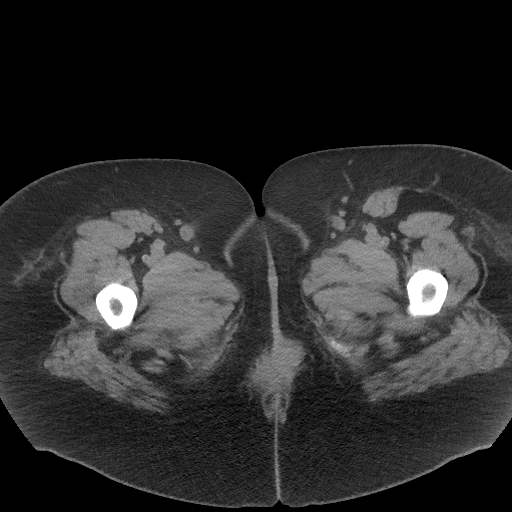
[im 5/95  bone]
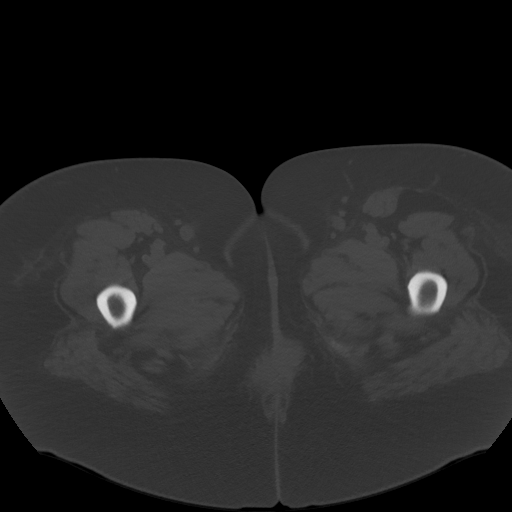
[im 13/95  soft-tissue]
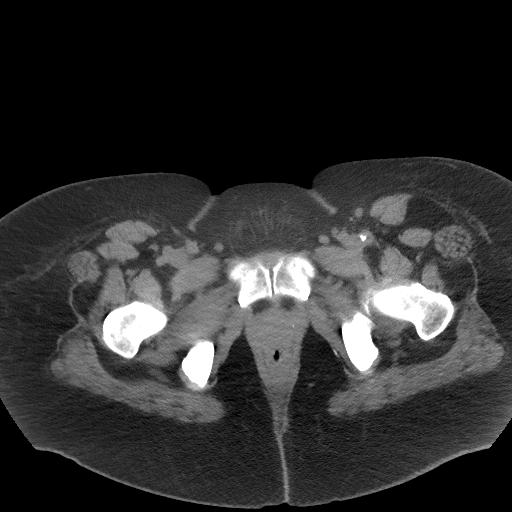
[im 21/95  soft-tissue]
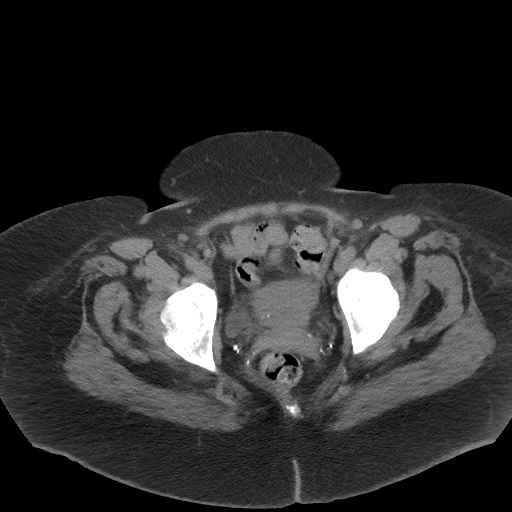
[im 25/95  soft-tissue]
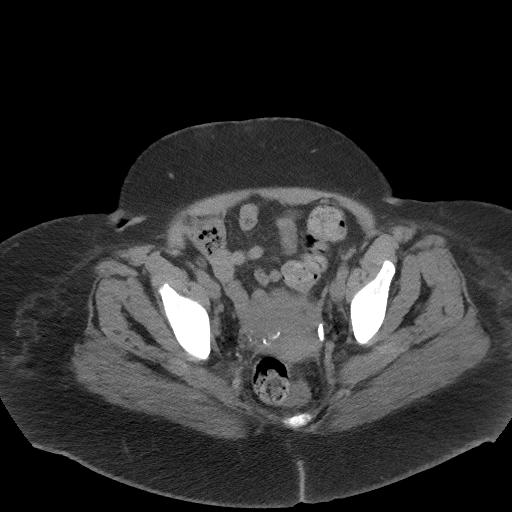
[im 33/95  soft-tissue]
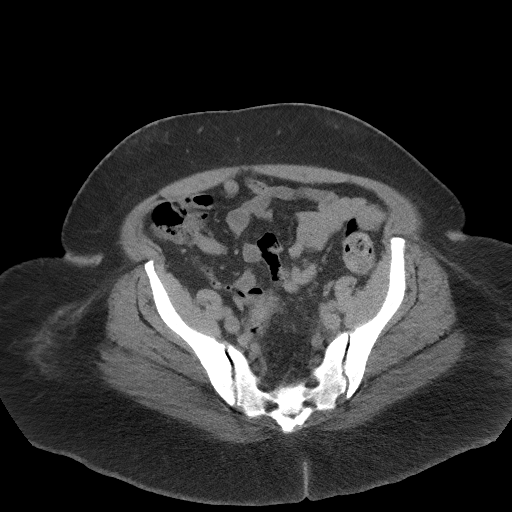
[im 41/95  soft-tissue]
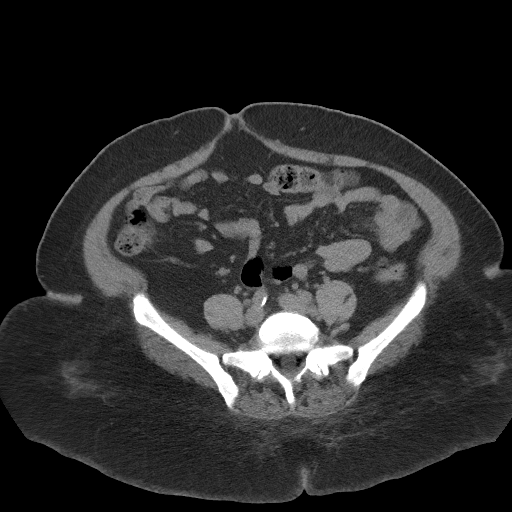
[im 50/95  soft-tissue]
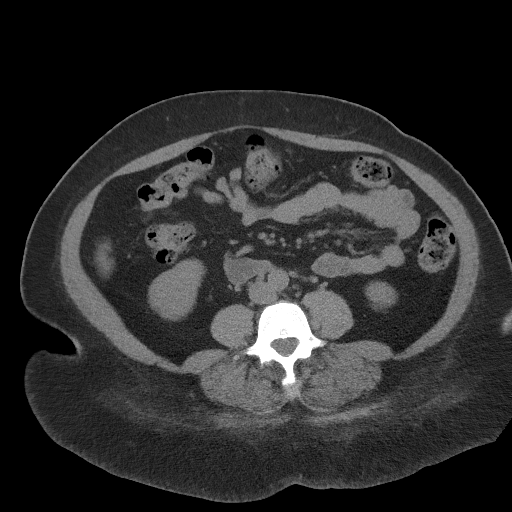
[im 54/95  soft-tissue]
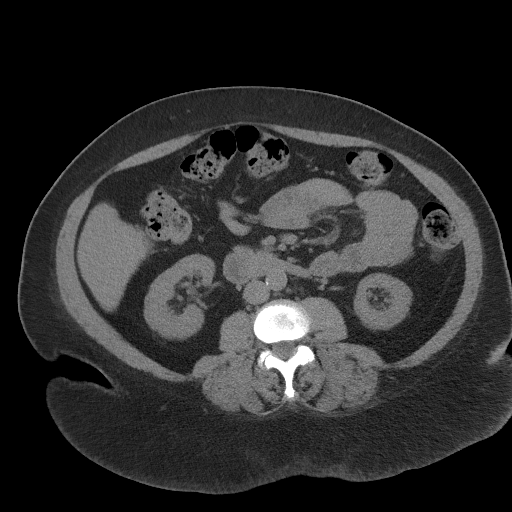
[im 62/95  soft-tissue]
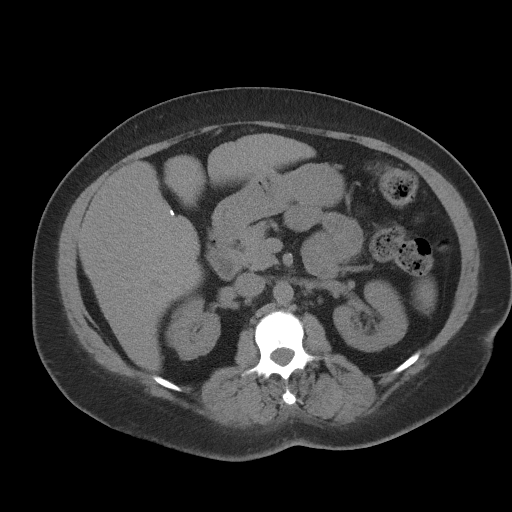
[im 62/95  bone]
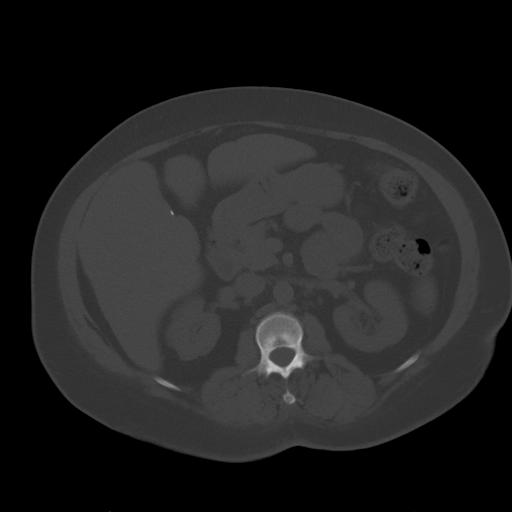
[im 70/95  soft-tissue]
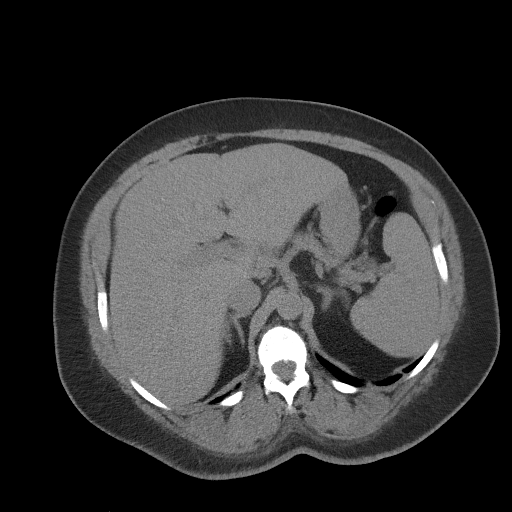
[im 74/95  soft-tissue]
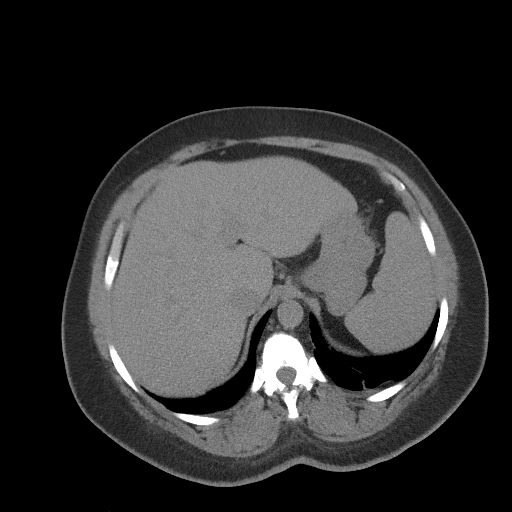
[im 82/95  soft-tissue]
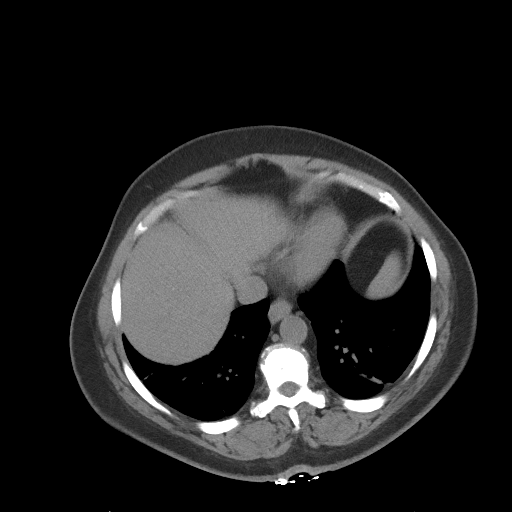
[im 90/95  soft-tissue]
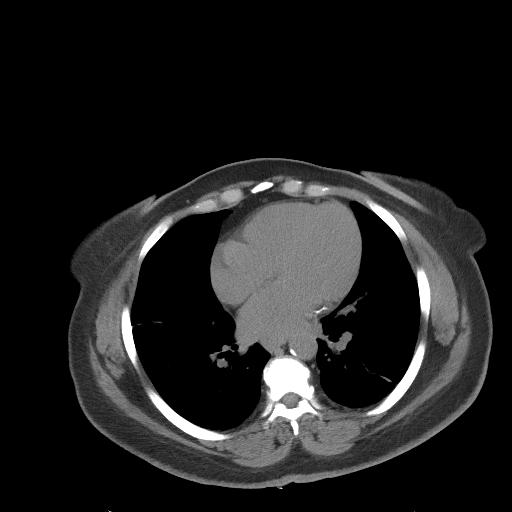

[Series 5: coronal st · coronal · 0.76mm/px · 3 of 120 slices shown]
[im 40/120  soft-tissue]
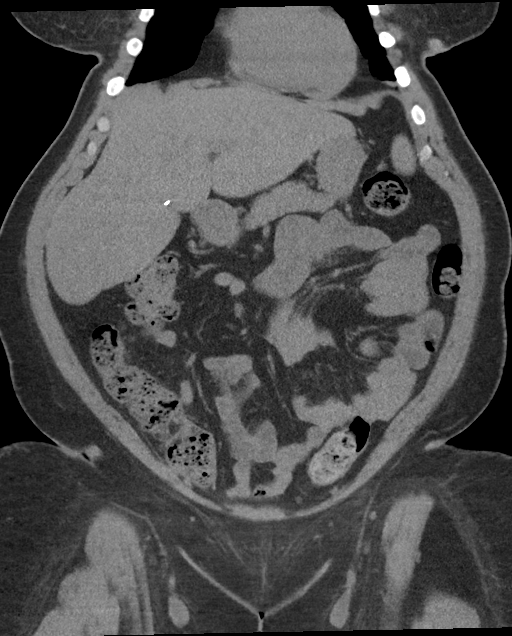
[im 53/120  soft-tissue]
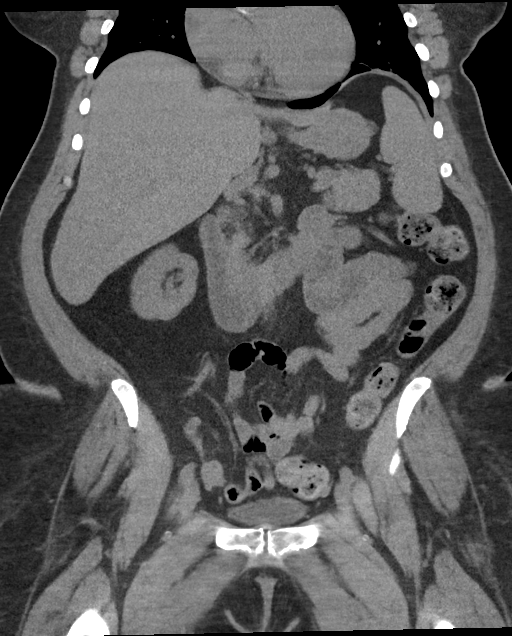
[im 67/120  soft-tissue]
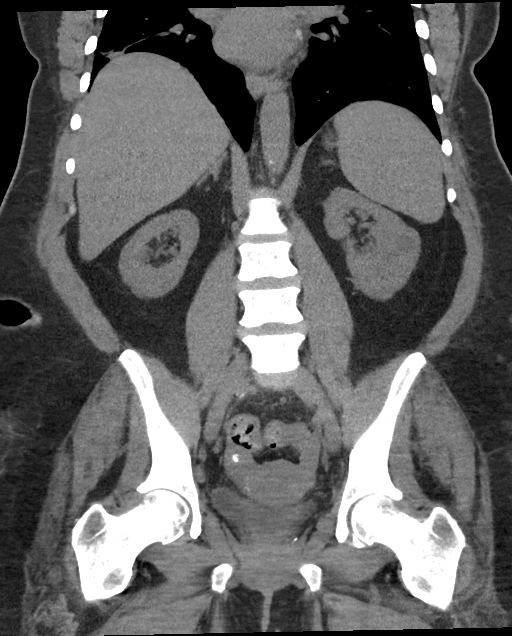

[16 of 46 positions shown; findings below may reference images not displayed]

FINDINGS: Lower chest: Linear areas of scarring in the lung bases. No acute
abnormality.

Hepatobiliary: Liver is prominent with a craniocaudal length of
approximately 20 cm. No focal hepatic abnormality. Prior
cholecystectomy.

Pancreas: No focal abnormality or ductal dilatation.

Spleen: No focal abnormality.  Normal size.

Adrenals/Urinary Tract: Small low-density areas within the mid and
lower pole of the left kidney, likely small cysts, stable. No stones
or hydronephrosis. Adrenal glands and urinary bladder unremarkable.

Stomach/Bowel: Few scattered colonic diverticula. No active
diverticulitis. Normal appendix. Stomach and small bowel
unremarkable.

Vascular/Lymphatic: Aortic atherosclerosis. No enlarged abdominal or
pelvic lymph nodes.

Reproductive: Uterus and adnexa unremarkable.  No mass.

Other: No free fluid or free air.

Musculoskeletal: No acute bony abnormality.
IMPRESSION: Stable bibasilar scarring.

Prominent liver/hepatomegaly.  No focal abnormality.

Aortic atherosclerosis.

No renal or ureteral stones.  No hydronephrosis.

Normal appendix.

## 2022-04-14 ENCOUNTER — Other Ambulatory Visit: Payer: Self-pay

## 2022-04-14 ENCOUNTER — Emergency Department: Payer: Medicare Other

## 2022-04-14 DIAGNOSIS — N186 End stage renal disease: Secondary | ICD-10-CM | POA: Insufficient documentation

## 2022-04-14 DIAGNOSIS — I5033 Acute on chronic diastolic (congestive) heart failure: Secondary | ICD-10-CM | POA: Insufficient documentation

## 2022-04-14 DIAGNOSIS — Z20822 Contact with and (suspected) exposure to covid-19: Secondary | ICD-10-CM | POA: Insufficient documentation

## 2022-04-14 DIAGNOSIS — J209 Acute bronchitis, unspecified: Secondary | ICD-10-CM | POA: Diagnosis not present

## 2022-04-14 DIAGNOSIS — E875 Hyperkalemia: Secondary | ICD-10-CM | POA: Diagnosis not present

## 2022-04-14 DIAGNOSIS — Z87891 Personal history of nicotine dependence: Secondary | ICD-10-CM | POA: Diagnosis not present

## 2022-04-14 DIAGNOSIS — D631 Anemia in chronic kidney disease: Secondary | ICD-10-CM | POA: Insufficient documentation

## 2022-04-14 DIAGNOSIS — R0602 Shortness of breath: Secondary | ICD-10-CM | POA: Diagnosis present

## 2022-04-14 DIAGNOSIS — Z992 Dependence on renal dialysis: Secondary | ICD-10-CM | POA: Insufficient documentation

## 2022-04-14 DIAGNOSIS — I2510 Atherosclerotic heart disease of native coronary artery without angina pectoris: Secondary | ICD-10-CM | POA: Insufficient documentation

## 2022-04-14 DIAGNOSIS — Z794 Long term (current) use of insulin: Secondary | ICD-10-CM | POA: Insufficient documentation

## 2022-04-14 DIAGNOSIS — Z7982 Long term (current) use of aspirin: Secondary | ICD-10-CM | POA: Insufficient documentation

## 2022-04-14 DIAGNOSIS — E8770 Fluid overload, unspecified: Secondary | ICD-10-CM | POA: Diagnosis not present

## 2022-04-14 DIAGNOSIS — E1122 Type 2 diabetes mellitus with diabetic chronic kidney disease: Secondary | ICD-10-CM | POA: Diagnosis not present

## 2022-04-14 DIAGNOSIS — Z952 Presence of prosthetic heart valve: Secondary | ICD-10-CM | POA: Diagnosis not present

## 2022-04-14 DIAGNOSIS — J449 Chronic obstructive pulmonary disease, unspecified: Secondary | ICD-10-CM | POA: Insufficient documentation

## 2022-04-14 DIAGNOSIS — J45909 Unspecified asthma, uncomplicated: Secondary | ICD-10-CM | POA: Insufficient documentation

## 2022-04-14 DIAGNOSIS — I132 Hypertensive heart and chronic kidney disease with heart failure and with stage 5 chronic kidney disease, or end stage renal disease: Secondary | ICD-10-CM | POA: Insufficient documentation

## 2022-04-14 LAB — CBC WITH DIFFERENTIAL/PLATELET
Abs Immature Granulocytes: 0.02 10*3/uL (ref 0.00–0.07)
Basophils Absolute: 0 10*3/uL (ref 0.0–0.1)
Basophils Relative: 0 %
Eosinophils Absolute: 0.4 10*3/uL (ref 0.0–0.5)
Eosinophils Relative: 6 %
HCT: 33.6 % — ABNORMAL LOW (ref 36.0–46.0)
Hemoglobin: 10.1 g/dL — ABNORMAL LOW (ref 12.0–15.0)
Immature Granulocytes: 0 %
Lymphocytes Relative: 29 %
Lymphs Abs: 1.8 10*3/uL (ref 0.7–4.0)
MCH: 24.6 pg — ABNORMAL LOW (ref 26.0–34.0)
MCHC: 30.1 g/dL (ref 30.0–36.0)
MCV: 82 fL (ref 80.0–100.0)
Monocytes Absolute: 0.6 10*3/uL (ref 0.1–1.0)
Monocytes Relative: 9 %
Neutro Abs: 3.4 10*3/uL (ref 1.7–7.7)
Neutrophils Relative %: 56 %
Platelets: 183 10*3/uL (ref 150–400)
RBC: 4.1 MIL/uL (ref 3.87–5.11)
RDW: 19.6 % — ABNORMAL HIGH (ref 11.5–15.5)
WBC: 6.1 10*3/uL (ref 4.0–10.5)
nRBC: 0 % (ref 0.0–0.2)

## 2022-04-14 LAB — COMPREHENSIVE METABOLIC PANEL
ALT: 33 U/L (ref 0–44)
AST: 35 U/L (ref 15–41)
Albumin: 3.3 g/dL — ABNORMAL LOW (ref 3.5–5.0)
Alkaline Phosphatase: 152 U/L — ABNORMAL HIGH (ref 38–126)
Anion gap: 17 — ABNORMAL HIGH (ref 5–15)
BUN: 64 mg/dL — ABNORMAL HIGH (ref 8–23)
CO2: 29 mmol/L (ref 22–32)
Calcium: 7.8 mg/dL — ABNORMAL LOW (ref 8.9–10.3)
Chloride: 92 mmol/L — ABNORMAL LOW (ref 98–111)
Creatinine, Ser: 9.79 mg/dL — ABNORMAL HIGH (ref 0.44–1.00)
GFR, Estimated: 4 mL/min — ABNORMAL LOW (ref >60)
Glucose, Bld: 154 mg/dL — ABNORMAL HIGH (ref 70–99)
Potassium: 5.3 mmol/L — ABNORMAL HIGH (ref 3.5–5.1)
Sodium: 138 mmol/L (ref 135–145)
Total Bilirubin: 0.5 mg/dL (ref 0.3–1.2)
Total Protein: 7.8 g/dL (ref 6.5–8.1)

## 2022-04-14 LAB — PROTIME-INR
INR: 1.1 (ref 0.8–1.2)
Prothrombin Time: 14.1 seconds (ref 11.4–15.2)

## 2022-04-14 LAB — APTT: aPTT: 28 seconds (ref 24–36)

## 2022-04-14 MED ORDER — ALBUTEROL SULFATE HFA 108 (90 BASE) MCG/ACT IN AERS
2.0000 | INHALATION_SPRAY | RESPIRATORY_TRACT | Status: DC | PRN
  Administered 2022-04-14: 2 via RESPIRATORY_TRACT
  Filled 2022-04-14: qty 6.7

## 2022-04-14 NOTE — ED Triage Notes (Addendum)
Pt via POV c/o SOB. History of CHF and states she was lying down at home and suddenly became SOB. Pt is also a T/Th/Sat hemodialysis pt with fistula in left upper arm. Pt missed her treatment today because she was feeling bad. Pt states she is also very congested with productive cough, runny nose, sneezing, and watery eyes. She is not aware of recent sick contacts. Pt took a covid test at home on Sunday but it was negative. Pt also notes 8/10 left wrist pain worse with movement.

## 2022-04-15 ENCOUNTER — Observation Stay
Admission: EM | Admit: 2022-04-15 | Discharge: 2022-04-16 | Disposition: A | Discharge status: Home / Self Care | Payer: Medicare Other | Attending: Internal Medicine | Admitting: Internal Medicine

## 2022-04-15 ENCOUNTER — Emergency Department: Payer: Medicare Other

## 2022-04-15 DIAGNOSIS — Z794 Long term (current) use of insulin: Secondary | ICD-10-CM

## 2022-04-15 DIAGNOSIS — J449 Chronic obstructive pulmonary disease, unspecified: Secondary | ICD-10-CM

## 2022-04-15 DIAGNOSIS — R0602 Shortness of breath: Secondary | ICD-10-CM

## 2022-04-15 DIAGNOSIS — G4733 Obstructive sleep apnea (adult) (pediatric): Secondary | ICD-10-CM | POA: Diagnosis present

## 2022-04-15 DIAGNOSIS — J441 Chronic obstructive pulmonary disease with (acute) exacerbation: Secondary | ICD-10-CM | POA: Diagnosis not present

## 2022-04-15 DIAGNOSIS — I2510 Atherosclerotic heart disease of native coronary artery without angina pectoris: Secondary | ICD-10-CM | POA: Diagnosis present

## 2022-04-15 DIAGNOSIS — E8770 Fluid overload, unspecified: Secondary | ICD-10-CM | POA: Diagnosis present

## 2022-04-15 DIAGNOSIS — E875 Hyperkalemia: Secondary | ICD-10-CM | POA: Diagnosis present

## 2022-04-15 DIAGNOSIS — I10 Essential (primary) hypertension: Secondary | ICD-10-CM | POA: Diagnosis present

## 2022-04-15 DIAGNOSIS — I5033 Acute on chronic diastolic (congestive) heart failure: Secondary | ICD-10-CM | POA: Diagnosis present

## 2022-04-15 DIAGNOSIS — N186 End stage renal disease: Secondary | ICD-10-CM

## 2022-04-15 DIAGNOSIS — J209 Acute bronchitis, unspecified: Secondary | ICD-10-CM | POA: Diagnosis not present

## 2022-04-15 DIAGNOSIS — D631 Anemia in chronic kidney disease: Secondary | ICD-10-CM | POA: Diagnosis present

## 2022-04-15 LAB — CBC
HCT: 32.9 % — ABNORMAL LOW (ref 36.0–46.0)
HCT: 34.9 % — ABNORMAL LOW (ref 36.0–46.0)
Hemoglobin: 9.9 g/dL — ABNORMAL LOW (ref 12.0–15.0)
Hemoglobin: 10.6 g/dL — ABNORMAL LOW (ref 12.0–15.0)
MCH: 24.1 pg — ABNORMAL LOW (ref 26.0–34.0)
MCH: 24.2 pg — ABNORMAL LOW (ref 26.0–34.0)
MCHC: 30.1 g/dL (ref 30.0–36.0)
MCHC: 30.4 g/dL (ref 30.0–36.0)
MCV: 80.2 fL (ref 80.0–100.0)
MCV: 79.7 fL — ABNORMAL LOW (ref 80.0–100.0)
Platelets: 184 10*3/uL (ref 150–400)
Platelets: 191 10*3/uL (ref 150–400)
RBC: 4.1 MIL/uL (ref 3.87–5.11)
RBC: 4.38 MIL/uL (ref 3.87–5.11)
RDW: 19.7 % — ABNORMAL HIGH (ref 11.5–15.5)
RDW: 19.4 % — ABNORMAL HIGH (ref 11.5–15.5)
WBC: 4.8 10*3/uL (ref 4.0–10.5)
WBC: 4.6 10*3/uL (ref 4.0–10.5)
nRBC: 0 % (ref 0.0–0.2)
nRBC: 0 % (ref 0.0–0.2)

## 2022-04-15 LAB — TROPONIN I (HIGH SENSITIVITY)
Troponin I (High Sensitivity): 13 ng/L (ref <18)
Troponin I (High Sensitivity): 13 ng/L (ref <18)

## 2022-04-15 LAB — HEPATITIS C ANTIBODY: HCV Ab: REACTIVE — AB

## 2022-04-15 LAB — BASIC METABOLIC PANEL
Anion gap: 13 (ref 5–15)
BUN: 67 mg/dL — ABNORMAL HIGH (ref 8–23)
CO2: 26 mmol/L (ref 22–32)
Calcium: 7.9 mg/dL — ABNORMAL LOW (ref 8.9–10.3)
Chloride: 98 mmol/L (ref 98–111)
Creatinine, Ser: 10.16 mg/dL — ABNORMAL HIGH (ref 0.44–1.00)
GFR, Estimated: 4 mL/min — ABNORMAL LOW (ref >60)
Glucose, Bld: 128 mg/dL — ABNORMAL HIGH (ref 70–99)
Potassium: 5.4 mmol/L — ABNORMAL HIGH (ref 3.5–5.1)
Sodium: 137 mmol/L (ref 135–145)

## 2022-04-15 LAB — HEPATITIS B SURFACE ANTIBODY,QUALITATIVE: Hep B S Ab: REACTIVE — AB

## 2022-04-15 LAB — GLUCOSE, CAPILLARY: Glucose-Capillary: 199 mg/dL — ABNORMAL HIGH (ref 70–99)

## 2022-04-15 LAB — HEPATITIS B CORE ANTIBODY, TOTAL: Hep B Core Total Ab: NONREACTIVE

## 2022-04-15 LAB — BRAIN NATRIURETIC PEPTIDE: B Natriuretic Peptide: 245.5 pg/mL — ABNORMAL HIGH (ref 0.0–100.0)

## 2022-04-15 LAB — HEPATITIS B SURFACE ANTIGEN: Hepatitis B Surface Ag: NONREACTIVE

## 2022-04-15 LAB — CREATININE, SERUM
Creatinine, Ser: 5.53 mg/dL — ABNORMAL HIGH (ref 0.44–1.00)
GFR, Estimated: 8 mL/min — ABNORMAL LOW (ref >60)

## 2022-04-15 LAB — SARS CORONAVIRUS 2 BY RT PCR: SARS Coronavirus 2 by RT PCR: NEGATIVE

## 2022-04-15 MED ORDER — ACETAMINOPHEN 650 MG RE SUPP: 650.0000 mg | Freq: Four times a day (QID) | RECTAL | Status: DC | PRN

## 2022-04-15 MED ORDER — HEPARIN SODIUM (PORCINE) 1000 UNIT/ML DIALYSIS: 20.0000 [IU]/kg | INTRAMUSCULAR | Status: DC | PRN

## 2022-04-15 MED ORDER — VALACYCLOVIR HCL 500 MG PO TABS: 500.0000 mg | ORAL_TABLET | ORAL | Status: DC

## 2022-04-15 MED ORDER — ALBUTEROL SULFATE (2.5 MG/3ML) 0.083% IN NEBU: 2.5000 mg | INHALATION_SOLUTION | RESPIRATORY_TRACT | Status: DC | PRN

## 2022-04-15 MED ORDER — ATORVASTATIN CALCIUM 80 MG PO TABS
80.0000 mg | ORAL_TABLET | Freq: Every day | ORAL | Status: DC
  Administered 2022-04-15: 80 mg via ORAL
  Filled 2022-04-15: qty 1

## 2022-04-15 MED ORDER — IPRATROPIUM-ALBUTEROL 0.5-2.5 (3) MG/3ML IN SOLN
3.0000 mL | Freq: Four times a day (QID) | RESPIRATORY_TRACT | Status: DC
  Administered 2022-04-15 (×2): 3 mL via RESPIRATORY_TRACT
  Filled 2022-04-15 (×2): qty 3

## 2022-04-15 MED ORDER — CHLORHEXIDINE GLUCONATE CLOTH 2 % EX PADS
6.0000 | MEDICATED_PAD | Freq: Every day | CUTANEOUS | Status: DC
  Administered 2022-04-16: 6 via TOPICAL
  Filled 2022-04-15: qty 6

## 2022-04-15 MED ORDER — ALBUTEROL SULFATE (2.5 MG/3ML) 0.083% IN NEBU
2.5000 mg | INHALATION_SOLUTION | Freq: Once | RESPIRATORY_TRACT | Status: AC
  Administered 2022-04-15: 2.5 mg via RESPIRATORY_TRACT
  Filled 2022-04-15: qty 3

## 2022-04-15 MED ORDER — FLUTICASONE FUROATE-VILANTEROL 200-25 MCG/INH IN AEPB: 1.0000 | INHALATION_SPRAY | Freq: Every day | RESPIRATORY_TRACT | Status: DC

## 2022-04-15 MED ORDER — GUAIFENESIN ER 600 MG PO TB12
600.0000 mg | ORAL_TABLET | Freq: Two times a day (BID) | ORAL | Status: DC
  Administered 2022-04-15 – 2022-04-16 (×3): 600 mg via ORAL
  Filled 2022-04-15 (×3): qty 1

## 2022-04-15 MED ORDER — OXYCODONE HCL 5 MG PO TABS
10.0000 mg | ORAL_TABLET | Freq: Four times a day (QID) | ORAL | Status: DC | PRN
  Administered 2022-04-15 – 2022-04-16 (×3): 10 mg via ORAL
  Filled 2022-04-15 (×3): qty 2

## 2022-04-15 MED ORDER — PREGABALIN 25 MG PO CAPS
25.0000 mg | ORAL_CAPSULE | Freq: Two times a day (BID) | ORAL | Status: DC
  Administered 2022-04-15 – 2022-04-16 (×3): 25 mg via ORAL
  Filled 2022-04-15 (×3): qty 1

## 2022-04-15 MED ORDER — IPRATROPIUM-ALBUTEROL 0.5-2.5 (3) MG/3ML IN SOLN
3.0000 mL | Freq: Two times a day (BID) | RESPIRATORY_TRACT | Status: DC
  Administered 2022-04-16: 3 mL via RESPIRATORY_TRACT
  Filled 2022-04-15: qty 3

## 2022-04-15 MED ORDER — NITROGLYCERIN 0.4 MG SL SUBL: 0.4000 mg | SUBLINGUAL_TABLET | SUBLINGUAL | Status: DC | PRN

## 2022-04-15 MED ORDER — FLUTICASONE FUROATE-VILANTEROL 100-25 MCG/ACT IN AEPB: 1.0000 | INHALATION_SPRAY | Freq: Every day | RESPIRATORY_TRACT | Status: DC

## 2022-04-15 MED ORDER — HEPARIN SODIUM (PORCINE) 5000 UNIT/ML IJ SOLN
5000.0000 [IU] | Freq: Three times a day (TID) | INTRAMUSCULAR | Status: DC
  Administered 2022-04-15 – 2022-04-16 (×2): 5000 [IU] via SUBCUTANEOUS
  Filled 2022-04-15 (×3): qty 1

## 2022-04-15 MED ORDER — ATORVASTATIN CALCIUM 20 MG PO TABS: 80.0000 mg | ORAL_TABLET | Freq: Every evening | ORAL | Status: DC

## 2022-04-15 MED ORDER — CARVEDILOL 12.5 MG PO TABS
12.5000 mg | ORAL_TABLET | Freq: Two times a day (BID) | ORAL | Status: DC
  Administered 2022-04-15 – 2022-04-16 (×2): 12.5 mg via ORAL
  Filled 2022-04-15: qty 2
  Filled 2022-04-15: qty 1

## 2022-04-15 MED ORDER — ASPIRIN 81 MG PO TBEC
81.0000 mg | DELAYED_RELEASE_TABLET | Freq: Every day | ORAL | Status: DC
  Administered 2022-04-15: 81 mg via ORAL
  Filled 2022-04-15: qty 1

## 2022-04-15 MED ORDER — ACETAMINOPHEN 325 MG PO TABS: 650.0000 mg | ORAL_TABLET | Freq: Four times a day (QID) | ORAL | Status: DC | PRN

## 2022-04-15 MED ORDER — FLUTICASONE FUROATE-VILANTEROL 200-25 MCG/ACT IN AEPB
1.0000 | INHALATION_SPRAY | Freq: Every day | RESPIRATORY_TRACT | Status: DC
  Administered 2022-04-15 – 2022-04-16 (×2): 1 via RESPIRATORY_TRACT
  Filled 2022-04-15: qty 28

## 2022-04-15 MED ORDER — ASPIRIN 81 MG PO TBEC
81.0000 mg | DELAYED_RELEASE_TABLET | Freq: Every day | ORAL | Status: DC
  Administered 2022-04-16: 81 mg via ORAL
  Filled 2022-04-15 (×2): qty 1

## 2022-04-15 MED ORDER — IPRATROPIUM-ALBUTEROL 0.5-2.5 (3) MG/3ML IN SOLN: 3.0000 mL | Freq: Four times a day (QID) | RESPIRATORY_TRACT | Status: DC | PRN

## 2022-04-15 NOTE — Assessment & Plan Note (Addendum)
  Continue home inhalers and Flonase Scheduled bronchodilators with as needed albuterol Antitussives, decongestants and supportive care

## 2022-04-15 NOTE — ED Notes (Addendum)
Pt transported to dialysis

## 2022-04-15 NOTE — Progress Notes (Signed)
[facility], [place] 04/15/22  Subjective:   LOS: 0  Missed her dialysis treatment on Tuesday.  This morning she reports that she is not able to lay flat due to shortness of breath.  She does have some lower extremity edema.  Currently on room air when seen.  Does have a wet sounding cough.Chest x-Mires shows right basilar scarring, no new focal infiltrate  Objective:  Vital signs in last 24 hours:  Temp:  [97.6 F (36.4 C)-97.9 F (36.6 C)] 97.8 F (36.6 C) (10/04 0855) Pulse Rate:  [59-69] 62 (10/04 1000) Resp:  [10-20] 10 (10/04 1000) BP: (123-159)/(53-106) 133/66 (10/04 1000) SpO2:  [95 %-100 %] 97 % (10/04 1000) Weight:  [117.5 kg] 117.5 kg (10/03 2255)  Weight change:  Filed Weights   04/14/22 2255  Weight: 117.5 kg    Intake/Output:   No intake or output data in the 24 hours ending 04/15/22 1019  Physical Exam: General:  No acute distress, laying in the bed  HEENT  anicteric, moist oral mucous membrane  Pulm/lungs  normal breathing effort, lung sounds are coarse  CVS/Heart  regular rhythm, no rub or gallop  Abdomen:   Soft, nontender  Extremities:  + peripheral edema  Neurologic:  Alert, oriented, able to follow commands  Skin:  No acute rashes  Left arm AV fistula    Basic Metabolic Panel:  Recent Labs  Lab 04/14/22 2324 04/15/22 0922  NA 138 137  K 5.3* 5.4*  CL 92* 98  CO2 29 26  GLUCOSE 154* 128*  BUN 64* 67*  CREATININE 9.79* 10.16*  CALCIUM 7.8* 7.9*     CBC: Recent Labs  Lab 04/14/22 2324 04/15/22 0922  WBC 6.1 4.8  NEUTROABS 3.4  --   HGB 10.1* 9.9*  HCT 33.6* 32.9*  MCV 82.0 80.2  PLT 183 184      Lab Results  Component Value Date   HEPBSAG NON REACTIVE 09/18/2021   HEPBSAB Reactive (A) 09/18/2021      Microbiology:  Recent Results (from the past 240 hour(s))  SARS Coronavirus 2 by RT PCR (hospital order, performed in [place] hospital lab) *cepheid single result test* Anterior Nasal  Swab     Status: None   Collection Time: 04/14/22 11:11 PM   Specimen: Anterior Nasal Swab  Result Value Ref Range Status   SARS Coronavirus 2 by RT PCR NEGATIVE NEGATIVE Final    Comment: (NOTE) SARS-CoV-2 target nucleic acids are NOT DETECTED.  The SARS-CoV-2 RNA is generally detectable in upper and lower respiratory specimens during the acute phase of infection. The lowest concentration of SARS-CoV-2 viral copies this assay can detect is 250 copies / mL. A negative result does not preclude SARS-CoV-2 infection and should not be used as the sole basis for treatment or other patient management decisions.  A negative result may occur with improper specimen collection / handling, submission of specimen other than nasopharyngeal swab, presence of viral mutation(s) within the areas targeted by this assay, and inadequate number of viral copies (<250 copies / mL). A negative result must be combined with clinical observations, patient history, and epidemiological information.  Fact Sheet for Patients:   https://www.patel.info/  Fact Sheet for Healthcare Providers: https://hall.com/  This test is not yet approved or  cleared by the [place] FDA and has been authorized for detection and/or diagnosis of SARS-CoV-2 by FDA under an Emergency Use Authorization (EUA).  This EUA will remain in effect (meaning this test can be used)  for the duration of the COVID-19 declaration under Section 564(b)(1) of the Act, 21 U.S.C. section 360bbb-3(b)(1), unless the authorization is terminated or revoked sooner.  Performed at [facility], [address] [place], [place] [address]     Coagulation Studies: Recent Labs    04/14/22 2324  LABPROT 14.1  INR 1.1    Urinalysis: No results for input(s): "COLORURINE", "LABSPEC", "PHURINE", "GLUCOSEU", "HGBUR", "BILIRUBINUR", "KETONESUR", "PROTEINUR", "UROBILINOGEN", "NITRITE", "LEUKOCYTESUR" in  the last 72 hours.  Invalid input(s): "APPERANCEUR"    Imaging: DG Wrist Complete Left  Result Date: 04/15/2022 CLINICAL DATA:  Distal radius area pain. EXAM: LEFT WRIST - COMPLETE 3+ VIEW COMPARISON:  Left hand series 02/10/2016 FINDINGS: Bone mineralization is normal. There is no evidence of fracture, dislocation or significant degenerative change. Trace spurring of the triscaphe and first CMC joints is again shown. There is increased stranding edema since 2017 in the dorsal forearm and wrist. There is interval development of patchy calcifications in the distal radial and ulnar arteries extending into the hand. IMPRESSION: Dorsal soft tissue edema with increased vascular calcifications since 2017. No acute underlying bone abnormality. Electronically Signed   By: Telford Nab M.D.   On: 04/15/2022 03:29   DG Chest 2 View  Result Date: 04/14/2022 CLINICAL DATA:  Cough and shortness of breath EXAM: CHEST - 2 VIEW COMPARISON:  09/17/2021 FINDINGS: Cardiac shadow is within normal limits. Lungs are well aerated bilaterally. Stable right basilar scarring is noted. No new focal infiltrate is seen. No bony abnormality is noted. IMPRESSION: Mild right basilar scarring.  No acute abnormality noted. Electronically Signed   By: Inez Catalina M.D.   On: 04/14/2022 23:30     Medications:     aspirin EC  81 mg Oral Daily   atorvastatin  80 mg Oral QHS   Chlorhexidine Gluconate Cloth  6 each Topical Q0600   pregabalin  25 mg Oral BID   valACYclovir  500 mg Oral QODAY   albuterol, heparin, ipratropium-albuterol  Assessment/ Plan:  66 y.o. female with end-stage renal disease on hemodialysis, diabetes type 2, hypertension, coronary disease status post stent placement, history of MI in 2017, peripheral arterial disease, hyperlipidemia, GERD, chronic combined systolic and diastolic CHF, gout, asthma, hepatitis C, anemia, history of colostomy was admitted on 04/15/2022 for  Principal Problem:   COPD with  acute bronchitis ([place]) Active Problems:   Essential hypertension   OSA (obstructive sleep apnea)   Hyperkalemia   CAD (coronary artery disease)   Acute on chronic diastolic CHF (congestive heart failure) (HCC)   Anemia due to end stage renal disease (HCC)   Volume overload  Volume overload [E87.70]  #.  Shortness of breath in the setting of ESRD, possibly from volume overload.  Patient does have lower extremity edema and some lung congestion on exam.  We will dialyze today with goal of volume removal as tolerated.   #Hyperkalemia Expected to improve with dialysis.   #. Anemia of CKD  Lab Results  Component Value Date   HGB 9.9 (L) 04/15/2022   Low dose EPO with HD  #. Secondary hyperparathyroidism of renal origin N 25.81      Component Value Date/Time   PTH 232 (H) 08/16/2017 0501   Lab Results  Component Value Date   PHOS 4.4 12/20/2021   Monitor calcium and phos level during this admission   #. Diabetes type 2 with CKD Hemoglobin A1C (%)  Date Value  10/28/2021 8.1 (A)   Hgb A1c MFr Bld (%)  Date Value  04/21/2021 12.8 (H)     LOS: [place] 10/4/202310:19 AM  [facility], [place]

## 2022-04-15 NOTE — Assessment & Plan Note (Deleted)
As needed bronchodilators, likely viral Antitussives, decongestants

## 2022-04-15 NOTE — Assessment & Plan Note (Signed)
BP fair control.  Continue carvedilol

## 2022-04-15 NOTE — Assessment & Plan Note (Signed)
CPAP nightly

## 2022-04-15 NOTE — Progress Notes (Signed)
PT did 3.5 hrs of HD, tolerated well with no issue.  UF = 2000 ml  PT had lunch during HD. Fistula bruit and thrill present.  Report given to floor R.N. Bp 107/51 Hr 77 Rr 15 O2sat 95

## 2022-04-15 NOTE — Assessment & Plan Note (Signed)
Mildly elevated potassium of 5.3 Should improve with dialysis in the next couple hours

## 2022-04-15 NOTE — Assessment & Plan Note (Signed)
Mild volume overload secondary to missed dialysis Patient with orthopnea, BNP 245 She will improve with dialysis in the a.m. Continue home carvedilol

## 2022-04-15 NOTE — Assessment & Plan Note (Addendum)
CAD with history of MI s/p stents x2 No complaints of chest pain and EKG nonacute.  Troponin 13 Continue aspirin, atorvastatin, carvedilol with nitroglycerin as needed

## 2022-04-15 NOTE — Progress Notes (Signed)
PROGRESS NOTE    [name]  ZOX:[id number] DOB: 06-09-1956 DOA: 04/15/2022 PCP: [facility]    Assessment & Plan:   Principal Problem:   COPD with acute bronchitis ([place]) Active Problems:   Acute on chronic diastolic CHF (congestive heart failure) (HCC)   Hyperkalemia   Essential hypertension   OSA (obstructive sleep apnea)   CAD (coronary artery disease)   Anemia due to end stage renal disease (HCC)   Volume overload  Assessment and Plan: COPD: with acute bronchitis. Continue on bronchodilators & encourage incentive spirometry. Viral respiratory panel ordered   Acute on chronic diastolic CHF: secondary to missed HD. Monitor I/Os. Volume management w/ HD. Continue on coreg    Hyperkalemia: management w/ HD    ACD: secondary to ESRD. No need for a transfusion currently    Hx of CAD: w/ hx stents x 2. No chest pain. Continue on aspirin, statin, coreg.    OSA : CPAP qhs   HTN: restart coreg after HD  Obesity: BMI 39.3. Complicates overall care & prognosis        DVT prophylaxis: heparin SQ Code Status: full  Family Communication: Disposition Plan: likely d/c back home   Level of care: Progressive  Status is: Observation The patient remains OBS appropriate and will d/c before 2 midnights.   Consultants:  Nephro   Procedures:  Antimicrobials:    Subjective: Pt c/o shortness of breath & cough   Objective: Vitals:   04/15/22 1245 04/15/22 1251 04/15/22 1407 04/15/22 1408  BP: (!) 129/53 (!) 107/51  (!) 125/56  Pulse: 69 77  74  Resp: '15 15  17  '$ Temp:   97.9 F (36.6 C)   TempSrc:   Oral   SpO2:    100%  Weight:      Height:        Intake/Output Summary (Last 24 hours) at 04/15/2022 1616 Last data filed at 04/15/2022 1251 Gross per 24 hour  Intake --  Output 2000 ml  Net -2000 ml   Filed Weights   04/14/22 2255  Weight: 117.5 kg    Examination:  General exam: Appears calm but uncomfortable  Respiratory system: decreased  breath sounds b/l  Cardiovascular system: S1 & S2+. No rubs, gallops or clicks.  Gastrointestinal system: Abdomen is obese, soft and nontender.  Normal bowel sounds heard. Central nervous system: Alert and oriented. Moves all extremities  Psychiatry: Judgement and insight appear normal. Mood & affect appropriate.     Data Reviewed: I have personally reviewed following labs and imaging studies  CBC: Recent Labs  Lab 04/14/22 2324 04/15/22 0922 04/15/22 1434  WBC 6.1 4.8 4.6  NEUTROABS 3.4  --   --   HGB 10.1* 9.9* 10.6*  HCT 33.6* 32.9* 34.9*  MCV 82.0 80.2 79.7*  PLT 183 184 811   Basic Metabolic Panel: Recent Labs  Lab 04/14/22 2324 04/15/22 0922 04/15/22 1434  NA 138 137  --   K 5.3* 5.4*  --   CL 92* 98  --   CO2 29 26  --   GLUCOSE 154* 128*  --   BUN 64* 67*  --   CREATININE 9.79* 10.16* 5.53*  CALCIUM 7.8* 7.9*  --    GFR: Estimated Creatinine Clearance: 13.8 mL/min (A) (by C-G formula based on SCr of 5.53 mg/dL (H)). Liver Function Tests: Recent Labs  Lab 04/14/22 2324  AST 35  ALT 33  ALKPHOS 152*  BILITOT 0.5  PROT 7.8  ALBUMIN 3.3*  No results for input(s): "LIPASE", "AMYLASE" in the last 168 hours. No results for input(s): "AMMONIA" in the last 168 hours. Coagulation Profile: Recent Labs  Lab 04/14/22 2324  INR 1.1   Cardiac Enzymes: No results for input(s): "CKTOTAL", "CKMB", "CKMBINDEX", "TROPONINI" in the last 168 hours. BNP (last 3 results) No results for input(s): "PROBNP" in the last 8760 hours. HbA1C: No results for input(s): "HGBA1C" in the last 72 hours. CBG: No results for input(s): "GLUCAP" in the last 168 hours. Lipid Profile: No results for input(s): "CHOL", "HDL", "LDLCALC", "TRIG", "CHOLHDL", "LDLDIRECT" in the last 72 hours. Thyroid Function Tests: No results for input(s): "TSH", "T4TOTAL", "FREET4", "T3FREE", "THYROIDAB" in the last 72 hours. Anemia Panel: No results for input(s): "VITAMINB12", "FOLATE", "FERRITIN",  "TIBC", "IRON", "RETICCTPCT" in the last 72 hours. Sepsis Labs: No results for input(s): "PROCALCITON", "LATICACIDVEN" in the last 168 hours.  Recent Results (from the past 240 hour(s))  SARS Coronavirus 2 by RT PCR (hospital order, performed in [facility] hospital lab) *cepheid single result test* Anterior Nasal Swab     Status: None   Collection Time: 04/14/22 11:11 PM   Specimen: Anterior Nasal Swab  Result Value Ref Range Status   SARS Coronavirus 2 by RT PCR NEGATIVE NEGATIVE Final    Comment: (NOTE) SARS-CoV-2 target nucleic acids are NOT DETECTED.  The SARS-CoV-2 RNA is generally detectable in upper and lower respiratory specimens during the acute phase of infection. The lowest concentration of SARS-CoV-2 viral copies this assay can detect is 250 copies / mL. A negative result does not preclude SARS-CoV-2 infection and should not be used as the sole basis for treatment or other patient management decisions.  A negative result may occur with improper specimen collection / handling, submission of specimen other than nasopharyngeal swab, presence of viral mutation(s) within the areas targeted by this assay, and inadequate number of viral copies (<250 copies / mL). A negative result must be combined with clinical observations, patient history, and epidemiological information.  Fact Sheet for Patients:   https://www.patel.info/  Fact Sheet for Healthcare Providers: https://hall.com/  This test is not yet approved or  cleared by the [place] FDA and has been authorized for detection and/or diagnosis of SARS-CoV-2 by FDA under an Emergency Use Authorization (EUA).  This EUA will remain in effect (meaning this test can be used) for the duration of the COVID-19 declaration under Section 564(b)(1) of the Act, 21 U.S.C. section 360bbb-3(b)(1), unless the authorization is terminated or revoked sooner.  Performed at [facility], [address], [place], [place] [address]          Radiology Studies: DG Wrist Complete Left  Result Date: 04/15/2022 CLINICAL DATA:  Distal radius area pain. EXAM: LEFT WRIST - COMPLETE 3+ VIEW COMPARISON:  Left hand series 02/10/2016 FINDINGS: Bone mineralization is normal. There is no evidence of fracture, dislocation or significant degenerative change. Trace spurring of the triscaphe and first CMC joints is again shown. There is increased stranding edema since 2017 in the dorsal forearm and wrist. There is interval development of patchy calcifications in the distal radial and ulnar arteries extending into the hand. IMPRESSION: Dorsal soft tissue edema with increased vascular calcifications since 2017. No acute underlying bone abnormality. Electronically Signed   By: Telford Nab M.D.   On: 04/15/2022 03:29   DG Chest 2 View  Result Date: 04/14/2022 CLINICAL DATA:  Cough and shortness of breath EXAM: CHEST - 2 VIEW COMPARISON:  09/17/2021 FINDINGS: Cardiac shadow is within  normal limits. Lungs are well aerated bilaterally. Stable right basilar scarring is noted. No new focal infiltrate is seen. No bony abnormality is noted. IMPRESSION: Mild right basilar scarring.  No acute abnormality noted. Electronically Signed   By: Inez Catalina M.D.   On: 04/14/2022 23:30        Scheduled Meds:  aspirin EC  81 mg Oral Daily   atorvastatin  80 mg Oral QHS   carvedilol  12.5 mg Oral BID WC   Chlorhexidine Gluconate Cloth  6 each Topical Q0600   fluticasone furoate-vilanterol  1 puff Inhalation Daily   guaiFENesin  600 mg Oral BID   heparin  5,000 Units Subcutaneous Q8H   ipratropium-albuterol  3 mL Nebulization Q6H   pregabalin  25 mg Oral BID   valACYclovir  500 mg Oral QODAY   Continuous Infusions:   LOS: 0 days    Time spent: 35 mins     Wyvonnia Dusky, MD [facility] Pager [phone]  If 7PM-7AM, please contact night-coverage [email or web address] 04/15/2022,  4:16 PM

## 2022-04-15 NOTE — ED Notes (Signed)
Lunch tray sat at bedside. Pt remains in dialysis

## 2022-04-15 NOTE — ED Provider Notes (Signed)
[facility] Provider Note    Event Date/Time   First MD Initiated Contact with Patient 04/15/22 0205     (approximate)   History   Shortness of Breath   HPI  [name] is a 66 y.o. female with history of ESRD on dialysis, diabetes, hypertension, CAD, PAD, hyperlipidemia, GERD, CHF, asthma, gout, and hepatitis C who presents with shortness of breath since yesterday so stated with orthopnea, cough and wheezing..  She denies fever or chills.  She states because she was feeling unwell yesterday she missed dialysis, so her last dialysis was on Saturday, now 4 days ago.  The patient also reports left wrist pain but denies any injuries.  I reviewed the past medical records.  She was most recently admitted in March and per the hospitalist discharge summary presented at that time with acute on chronic anemia, shortness of breath, toxic, and hypotension.   Physical Exam   Triage Vital Signs: ED Triage Vitals  Enc Vitals Group     BP 04/14/22 2254 (!) 154/69     Pulse Rate 04/14/22 2254 62     Resp 04/14/22 2254 20     Temp 04/14/22 2254 97.6 F (36.4 C)     Temp Source 04/14/22 2254 Oral     SpO2 04/14/22 2253 95 %     Weight 04/14/22 2255 259 lb (117.5 kg)     Height 04/14/22 2255 '5\' 8"'$  (1.727 m)     Head Circumference --      Peak Flow --      Pain Score 04/14/22 2255 0     Pain Loc --      Pain Edu? --      Excl. in [place]? --     Most recent vital signs: Vitals:   04/15/22 0330 04/15/22 0400  BP: (!) 147/106 (!) 157/65  Pulse: 60 61  Resp:  16  Temp:    SpO2: 100% 100%     General: Awake, no distress.  CV:  Good peripheral perfusion.  Resp:  Normal effort.  Wheezing and coarse breath sounds bilaterally. Abd:  No distention.  Other:  No peripheral edema.  Mild left distal radius tenderness with no deformity.  Full range of motion.   ED Results / Procedures / Treatments   Labs (all labs ordered are listed, but only abnormal results are  displayed) Labs Reviewed  CBC WITH DIFFERENTIAL/PLATELET - Abnormal; Notable for the following components:      Result Value   Hemoglobin 10.1 (*)    HCT 33.6 (*)    MCH 24.6 (*)    RDW 19.6 (*)    All other components within normal limits  COMPREHENSIVE METABOLIC PANEL - Abnormal; Notable for the following components:   Potassium 5.3 (*)    Chloride 92 (*)    Glucose, Bld 154 (*)    BUN 64 (*)    Creatinine, Ser 9.79 (*)    Calcium 7.8 (*)    Albumin 3.3 (*)    Alkaline Phosphatase 152 (*)    GFR, Estimated 4 (*)    Anion gap 17 (*)    All other components within normal limits  BRAIN NATRIURETIC PEPTIDE - Abnormal; Notable for the following components:   B Natriuretic Peptide 245.5 (*)    All other components within normal limits  SARS CORONAVIRUS 2 BY RT PCR  APTT  PROTIME-INR  HEPATITIS B SURFACE ANTIGEN  HEPATITIS B SURFACE ANTIBODY,QUALITATIVE  HEPATITIS B SURFACE ANTIBODY, QUANTITATIVE  HEPATITIS B CORE ANTIBODY, TOTAL  HEPATITIS C ANTIBODY  TROPONIN I (HIGH SENSITIVITY)  TROPONIN I (HIGH SENSITIVITY)     EKG  ED ECG REPORT I, Arta Silence, the attending physician, personally viewed and interpreted this ECG.  Date: 04/15/2022 EKG Time: 2308 Rate: 59 Rhythm: normal sinus rhythm QRS Axis: normal Intervals: normal ST/T Wave abnormalities: normal Narrative Interpretation: no evidence of acute ischemia    RADIOLOGY  Chest x-[place]: I independently viewed and interpreted the images; there is no focal consolidation or edema  XR L wrist: No acute fracture  PROCEDURES:  Critical Care performed: No  Procedures   MEDICATIONS ORDERED IN ED: Medications  albuterol (VENTOLIN HFA) 108 (90 Base) MCG/ACT inhaler 2 puff (2 puffs Inhalation Given 04/14/22 2331)  Chlorhexidine Gluconate Cloth 2 % PADS 6 each (has no administration in time range)  albuterol (PROVENTIL) (2.5 MG/3ML) 0.083% nebulizer solution 2.5 mg (2.5 mg Nebulization Given 04/15/22 0327)   albuterol (PROVENTIL) (2.5 MG/3ML) 0.083% nebulizer solution 2.5 mg (2.5 mg Nebulization Given 04/15/22 0326)     IMPRESSION / MDM / ASSESSMENT AND PLAN / ED COURSE  I reviewed the triage vital signs and the nursing notes.  66 year old female with PMH as noted above presents with shortness of breath and orthopnea since yesterday along with wheezing and productive cough.  She missed a dialysis session yesterday.  On exam the patient has normal vital signs except for hypertension.  O2 saturation is in the mid 90s on room air.  However, she does have diffuse wheezing and some coarse breath sounds bilaterally, possibly rales as well.  Differential diagnosis includes, but is not limited to, pulmonary edema due to ESRD/fluid overload, CHF exacerbation, asthma exacerbation, acute bronchitis, pneumonia.  Lab work-up is so far reassuring with no leukocytosis and minimally elevated BNP.  Potassium is borderline elevated.  Initial troponin is negative.  Given that the patient missed dialysis and has shortness of breath orthopnea, she will likely benefit from admission for dialysis today.  I consulted Dr. Candiss Norse from nephrology who agrees to arrange dialysis for the patient.  Patient's presentation is most consistent with acute presentation with potential threat to life or bodily function.  The patient is on the cardiac monitor to evaluate for evidence of arrhythmia and/or significant heart rate changes.  ----------------------------------------- 4:06 AM on 04/15/2022 -----------------------------------------  I consulted Dr. Damita Dunnings from the hospitalist service; based on her discussion she agrees to admit the patient.   FINAL CLINICAL IMPRESSION(S) / ED DIAGNOSES   Final diagnoses:  Shortness of breath  ESRD (end stage renal disease) ([place])     Rx / DC Orders   ED Discharge Orders     None        Note:  This document was prepared using Dragon voice recognition software and may  include unintentional dictation errors.    Arta Silence, MD 04/15/22 [phone]

## 2022-04-15 NOTE — H&P (Signed)
History and Physical    Patient: [name] YOV:[id number] DOB: Nov 20, 1955 DOA: 04/15/2022 DOS: the patient was seen and examined on 04/15/2022 PCP: [facility] [place]  Patient coming from: Home  Chief Complaint:  Chief Complaint  Patient presents with   Shortness of Breath    HPI: [name] is a 66 y.o. female with medical history significant for ESRD on HD TTS, T2DM, HTN, CAD s/p stent x2, ,diastolic CHF (EF 60 to 74% 09/2021), asthma, hep C , history of colostomy status secondary to necrotizing fasciitis abdomen s/p takedown in May 2023, who presents to the ED with sudden onset shortness of breath while lying down at home.  Patient missed her dialysis treatment earlier in the day because she was feeling unwell with a several day history of wheezing, productive cough, nasal congestion and sneezing.  COVID test done 2 days prior was negative.  She denies fever, chills or chest pain.  Denies lower extremity pain or swelling.  Denies abdominal pain ED course and data review: BP 154/65 with otherwise normal vitals.  Troponin 13, BNP 245, WBC 6.1, hemoglobin 10.1.  Potassium 5.3, COVID-negative.  EKG, personally viewed and interpreted showing sinus bradycardia at 59 with no acute ST-T wave changes.  Chest x-Danowski showing mild right basilar scarring with otherwise no acute abnormality Patient was treated with albuterol x2 with some improvement.  Given missed dialysis, nephrology was consulted, Dr. Candiss Norse who stated he will dialyze patient in the a.m.  Hospitalist consulted for admission.   Review of Systems: As mentioned in the history of present illness. All other systems reviewed and are negative.  Past Medical History:  Diagnosis Date   Anemia    Aortic stenosis    Echo 8/18: mean 13, peak 28, LVOT/AV mean velocity 0.51   Arthritis    Asthma    As a child    Bronchitis    CAD (coronary artery disease)    a. 09/2016: 50% Ost 1st Mrg stenosis, 50% 2nd Mrg stenosis, 20% Mid-Cx, 95%  Prox LAD, 40% mid-LAD, and 10% dist-LAD stenosis. Staged PCI with DES to Prox-LAD.    Chronic combined systolic and diastolic CHF (congestive heart failure) ([place]) 2011   echo 2/18: EF 55-60, normal wall motion, grade 2 diastolic dysfunction, trivial AI // echo 3/18: Septal and apical HK, EF 45-50, normal wall motion, trivial AI, mild LAE, PASP 38 // echo 8/18: EF 60-65, normal wall motion, grade 1 diastolic dysfunction, calcified aortic valve leaflets, mild aortic stenosis (mean 13, peak 28, LVOT/AV mean velocity 0.51), mild AI, moderate MAC, mild LAE, trivial TR    Chronic kidney disease    STAGE 4   Chronic kidney disease on chronic dialysis (HCC)    t, th, sat   Complication of anesthesia    Depression    Diabetes mellitus Dx 1989   Elevated lipids    GERD (gastroesophageal reflux disease)    Gout    Heart murmur    asymptomatic   Hepatitis C Dx 2013   Hypertension Dx 1989   Infected surgical wound    Lt arm   Myocardial infarction ([place]) 07/2015   Obesity    Pancreatitis 2013   Pneumonia    Refusal of blood transfusions as patient is Jehovah's Witness    pt states she is not [place] witness and does not refuse blood products   Tendinitis    Tremors of nervous system    LEFT HAND   Ulcer 2010   Past Surgical History:  Procedure Laterality Date   A/V FISTULAGRAM Left 04/11/2019   Procedure: A/V FISTULAGRAM;  Surgeon: Katha Cabal, MD;  Location: [place] [facility];  Service: Cardiovascular;  Laterality: Left;   A/V FISTULAGRAM Left 06/02/2019   Procedure: A/V FISTULAGRAM;  Surgeon: Katha Cabal, MD;  Location: [place] [facility];  Service: Cardiovascular;  Laterality: Left;   APPLICATION OF WOUND VAC Left 08/14/2017   Procedure: APPLICATION OF WOUND VAC Exchange;  Surgeon: Robert Bellow, MD;  Location: [facility];  Service: General;  Laterality: Left;   APPLICATION OF WOUND VAC Left 12/21/2018   Procedure: APPLICATION OF WOUND VAC;  Surgeon: Katha Cabal,  MD;  Location: [facility];  Service: Vascular;  Laterality: Left;   AV FISTULA PLACEMENT Left 08/19/2018   Procedure: ARTERIOVENOUS (AV) FISTULA CREATION ( BRACHIOBASILIC );  Surgeon: Katha Cabal, MD;  Location: [facility];  Service: Vascular;  Laterality: Left;   BASCILIC VEIN TRANSPOSITION Left 11/18/2018   Procedure: BASCILIC VEIN TRANSPOSITION;  Surgeon: Katha Cabal, MD;  Location: [facility];  Service: Vascular;  Laterality: Left;   BIOPSY  09/20/2020   Procedure: BIOPSY;  Surgeon: Carol Ada, MD;  Location: [place];  Service: Endoscopy;;   CHOLECYSTECTOMY     COLONOSCOPY WITH PROPOFOL N/A 02/03/2018   Procedure: COLONOSCOPY WITH PROPOFOL;  Surgeon: Lin Landsman, MD;  Location: [facility];  Service: Gastroenterology;  Laterality: N/A;   CORONARY ANGIOPLASTY  07/2015   STENT   CORONARY STENT INTERVENTION N/A 09/18/2016   Procedure: Coronary Stent Intervention;  Surgeon: Troy Sine, MD;  Location: [place] [facility];  Service: Cardiovascular;  Laterality: N/A;   DIALYSIS/PERMA CATHETER INSERTION N/A 05/10/2018   Procedure: DIALYSIS/PERMA CATHETER INSERTION;  Surgeon: Katha Cabal, MD;  Location: [place] [facility];  Service: Cardiovascular;  Laterality: N/A;   DRESSING CHANGE UNDER ANESTHESIA Left 08/15/2017   Procedure: exploration of wound for bleeding;  Surgeon: Robert Bellow, MD;  Location: [facility];  Service: General;  Laterality: Left;   ESOPHAGOGASTRODUODENOSCOPY (EGD) WITH PROPOFOL N/A 02/03/2018   Procedure: ESOPHAGOGASTRODUODENOSCOPY (EGD) WITH PROPOFOL;  Surgeon: Lin Landsman, MD;  Location: [facility];  Service: Gastroenterology;  Laterality: N/A;   ESOPHAGOGASTRODUODENOSCOPY (EGD) WITH PROPOFOL N/A 09/20/2020   Procedure: ESOPHAGOGASTRODUODENOSCOPY (EGD) WITH PROPOFOL;  Surgeon: Carol Ada, MD;  Location: [place];  Service: Endoscopy;  Laterality: N/A;   EYE SURGERY  11/17/2018   INCISION AND DRAINAGE ABSCESS Left 08/12/2017    Procedure: INCISION AND DRAINAGE ABSCESS;  Surgeon: Robert Bellow, MD;  Location: [facility];  Service: General;  Laterality: Left;   KNEE ARTHROSCOPY     LEFT HEART CATH N/A 09/18/2016   Procedure: Left Heart Cath;  Surgeon: Troy Sine, MD;  Location: [place] [facility];  Service: Cardiovascular;  Laterality: N/A;   LEFT HEART CATH AND CORONARY ANGIOGRAPHY N/A 09/16/2016   Procedure: Left Heart Cath and Coronary Angiography;  Surgeon: Burnell Blanks, MD;  Location: [place] [facility];  Service: Cardiovascular;  Laterality: N/A;   LEFT HEART CATH AND CORONARY ANGIOGRAPHY N/A 04/29/2017   Procedure: LEFT HEART CATH AND CORONARY ANGIOGRAPHY;  Surgeon: Nelva Bush, MD;  Location: [place] [facility];  Service: Cardiovascular;  Laterality: N/A;   LOWER EXTREMITY ANGIOGRAPHY Right 03/08/2018   Procedure: LOWER EXTREMITY ANGIOGRAPHY;  Surgeon: Katha Cabal, MD;  Location: [place] [facility];  Service: Cardiovascular;  Laterality: Right;   TUBAL LIGATION     TUBAL LIGATION     UPPER  EXTREMITY ANGIOGRAPHY Right 09/19/2019   Procedure: UPPER EXTREMITY ANGIOGRAPHY;  Surgeon: Katha Cabal, MD;  Location: [place] [facility];  Service: Cardiovascular;  Laterality: Right;   WOUND DEBRIDEMENT Left 12/21/2018   Procedure: DEBRIDEMENT WOUND;  Surgeon: Katha Cabal, MD;  Location: [facility];  Service: Vascular;  Laterality: Left;   WOUND DEBRIDEMENT Left 12/30/2018   Procedure: DEBRIDEMENT WOUND WITH VAC PLACEMENT (LEFT UPPER EXTREMITY);  Surgeon: Katha Cabal, MD;  Location: [facility];  Service: Vascular;  Laterality: Left;   Social History:  reports that she quit smoking about 41 years ago. Her smoking use included cigarettes. She has a 1.50 pack-year smoking history. She has never used smokeless tobacco. She reports current alcohol use. She reports that she does not currently use drugs after having used the following drugs: Marijuana.  Allergies  Allergen Reactions    Morphine Itching    Tolerated hydromorphone on 07/2020 *will take along with Benadryl*   Shellfish Allergy Anaphylaxis and Swelling   Diazepam Other (See Comments)    "felt like out of body experience"    Family History  Problem Relation Age of Onset   Colon cancer Mother    Heart attack Other    Heart attack Maternal Grandmother    Hypertension Sister    Hypertension Brother    Diabetes Paternal Grandmother    Breast cancer Neg Hx     Prior to Admission medications   Medication Sig Start Date End Date Taking? Authorizing Provider  albuterol (VENTOLIN HFA) 108 (90 Base) MCG/ACT inhaler Inhale 2 puffs into the lungs every 4 (four) hours as needed for wheezing or shortness of breath. 06/28/20   [name], PA-C  allopurinol (ZYLOPRIM) 100 MG tablet TAKE 1 TABLET BY MOUTH ONCE DAILY. Patient not taking: Reported on 12/20/2021 07/16/21   Birdie Sons, MD  aspirin 81 MG EC tablet Take 1 tablet (81 mg total) by mouth daily. 09/20/20   Thurnell Lose, MD  atorvastatin (LIPITOR) 80 MG tablet Take 1 tablet (80 mg total) by mouth every evening. Patient not taking: Reported on 12/20/2021 04/24/21   Virginia Crews, MD  B Complex-C-Folic Acid (RENA-VITE RX) 1 MG TABS Take 1 mg by mouth daily. Patient not taking: Reported on 12/20/2021 10/15/20   [provider]  carvedilol (COREG) 12.5 MG tablet Take 1 tablet (12.5 mg total) by mouth 2 (two) times daily with a meal. 09/19/21   Paige, Weldon Picking, DO  Continuous Blood Gluc Receiver (FREESTYLE LIBRE 14 DAY READER) DEVI To check blood sugar ACHS 09/27/20   [name], PA-C  Continuous Blood Gluc Sensor (FREESTYLE LIBRE 14 DAY SENSOR) MISC To check blood sugar ACHS; change every 14 days 09/27/20   [name], PA-C  fluticasone [facility]) 50 MCG/ACT nasal spray Place 2 sprays into both nostrils daily as needed for allergies or rhinitis. 09/28/17   [name], PA-C  fluticasone furoate-vilanterol (BREO  ELLIPTA) 200-25 MCG/INH AEPB Inhale 1 puff into the lungs daily. Patient taking differently: Inhale 1 puff into the lungs daily as needed (wheezing). 08/30/20   [name], PA-C  hydrOXYzine (ATARAX/VISTARIL) 25 MG tablet Take 1 tablet (25 mg total) by mouth 4 (four) times daily. Patient taking differently: Take 25 mg by mouth 3 (three) times daily. 12/03/20   Bacigalupo, Dionne Bucy, MD  Insulin NPH, Human,, Isophane, (NOVOLIN N FLEXPEN) 100 UNIT/ML Kiwkpen Inject 140 Units into the skin every morning. And 32G pen needles 1/day Patient taking differently: Inject  140 Units into the skin every morning. 09/05/21   Renato Shin, MD  nitroGLYCERIN (NITROSTAT) 0.4 MG SL tablet Place 1 tablet (0.4 mg total) under the tongue every 5 (five) minutes x 3 doses as needed for chest pain. 05/03/20   [name], PA-C  oxyCODONE (ROXICODONE) 5 MG immediate release tablet Take 1 tablet (5 mg total) by mouth every 12 (twelve) hours as needed for severe pain. 09/19/21   Shary Key, DO  pregabalin (LYRICA) 25 MG capsule Take 25 mg by mouth 2 (two) times daily. 12/08/21   [provider]  ULTICARE SHORT PEN NEEDLES 31G X 8 MM MISC USE THREE TIMES DAILY WITH INSULIN 01/21/21   Jerrol Banana., MD  valACYclovir (VALTREX) 500 MG tablet TAKE (1) TABLET BY MOUTH EVERY OTHER DAY. Patient taking differently: Take 500 mg by mouth every other day. 01/24/21   Bacigalupo, Dionne Bucy, MD  VELPHORO 500 MG chewable tablet Chew 1,500 mg by mouth 3 (three) times daily. 09/11/21   [provider]  Vitamin D, Ergocalciferol, (DRISDOL) 1.25 MG (50000 UNIT) CAPS capsule TAKE 1 CAPSULE BY MOUTH ONCE A MONTH Patient not taking: Reported on 12/20/2021 11/04/20   Birdie Sons, MD  gabapentin (NEURONTIN) 100 MG capsule Take 1 capsule (100 mg total) by mouth 3 (three) times daily. 08/19/17 02/03/19  [name], MD    Physical Exam: Vitals:   04/15/22 0230 04/15/22 0300 04/15/22 0330 04/15/22 0400  BP:  (!) 133/58 (!) 154/65 (!) 147/106 (!) 157/65  Pulse: 64 62 60 61  Resp:  16  16  Temp:      TempSrc:      SpO2: 98% 95% 100% 100%  Weight:      Height:       Physical Exam Vitals and nursing note reviewed.  Constitutional:      General: She is not in acute distress.    Appearance: She is ill-appearing.     Comments: Audibly wheezing and speaking in short sentences.  Sounds congested  HENT:     Head: Normocephalic and atraumatic.  Cardiovascular:     Rate and Rhythm: Normal rate and regular rhythm.     Heart sounds: Normal heart sounds.  Pulmonary:     Effort: Pulmonary effort is normal.     Breath sounds: Wheezing present.  Abdominal:     Palpations: Abdomen is soft.     Tenderness: There is no abdominal tenderness.  Neurological:     Mental Status: Mental status is at baseline.     Labs on Admission: I have personally reviewed following labs and imaging studies  CBC: Recent Labs  Lab 04/14/22 2324  WBC 6.1  NEUTROABS 3.4  HGB 10.1*  HCT 33.6*  MCV 82.0  PLT 914   Basic Metabolic Panel: Recent Labs  Lab 04/14/22 2324  NA 138  K 5.3*  CL 92*  CO2 29  GLUCOSE 154*  BUN 64*  CREATININE 9.79*  CALCIUM 7.8*   GFR: Estimated Creatinine Clearance: 7.8 mL/min (A) (by C-G formula based on SCr of 9.79 mg/dL (H)). Liver Function Tests: Recent Labs  Lab 04/14/22 2324  AST 35  ALT 33  ALKPHOS 152*  BILITOT 0.5  PROT 7.8  ALBUMIN 3.3*   No results for input(s): "LIPASE", "AMYLASE" in the last 168 hours. No results for input(s): "AMMONIA" in the last 168 hours. Coagulation Profile: Recent Labs  Lab 04/14/22 2324  INR 1.1   Cardiac Enzymes: No results for input(s): "CKTOTAL", "CKMB", "  CKMBINDEX", "TROPONINI" in the last 168 hours. BNP (last 3 results) No results for input(s): "PROBNP" in the last 8760 hours. HbA1C: No results for input(s): "HGBA1C" in the last 72 hours. CBG: No results for input(s): "GLUCAP" in the last 168 hours. Lipid  Profile: No results for input(s): "CHOL", "HDL", "LDLCALC", "TRIG", "CHOLHDL", "LDLDIRECT" in the last 72 hours. Thyroid Function Tests: No results for input(s): "TSH", "T4TOTAL", "FREET4", "T3FREE", "THYROIDAB" in the last 72 hours. Anemia Panel: No results for input(s): "VITAMINB12", "FOLATE", "FERRITIN", "TIBC", "IRON", "RETICCTPCT" in the last 72 hours. Urine analysis:    Component Value Date/Time   COLORURINE YELLOW 03/05/2021 1425   APPEARANCEUR HAZY (A) 03/05/2021 1425   LABSPEC 1.012 03/05/2021 1425   PHURINE 5.0 03/05/2021 1425   GLUCOSEU >=500 (A) 03/05/2021 1425   HGBUR NEGATIVE 03/05/2021 [place] 03/05/2021 1425   BILIRUBINUR neg 12/17/2016 1538   KETONESUR NEGATIVE 03/05/2021 1425   PROTEINUR 30 (A) 03/05/2021 1425   UROBILINOGEN 0.2 12/17/2016 1538   UROBILINOGEN 0.2 05/29/2010 2100   NITRITE NEGATIVE 03/05/2021 1425   LEUKOCYTESUR TRACE (A) 03/05/2021 1425    Radiological Exams on Admission: DG Wrist Complete Left  Result Date: 04/15/2022 CLINICAL DATA:  Distal radius area pain. EXAM: LEFT WRIST - COMPLETE 3+ VIEW COMPARISON:  Left hand series 02/10/2016 FINDINGS: Bone mineralization is normal. There is no evidence of fracture, dislocation or significant degenerative change. Trace spurring of the triscaphe and first CMC joints is again shown. There is increased stranding edema since 2017 in the dorsal forearm and wrist. There is interval development of patchy calcifications in the distal radial and ulnar arteries extending into the hand. IMPRESSION: Dorsal soft tissue edema with increased vascular calcifications since 2017. No acute underlying bone abnormality. Electronically Signed   By: Telford Nab M.D.   On: 04/15/2022 03:29   DG Chest 2 View  Result Date: 04/14/2022 CLINICAL DATA:  Cough and shortness of breath EXAM: CHEST - 2 VIEW COMPARISON:  09/17/2021 FINDINGS: Cardiac shadow is within normal limits. Lungs are well aerated bilaterally.  Stable right basilar scarring is noted. No new focal infiltrate is seen. No bony abnormality is noted. IMPRESSION: Mild right basilar scarring.  No acute abnormality noted. Electronically Signed   By: Inez Catalina M.D.   On: 04/14/2022 23:30     Data Reviewed: Relevant notes from primary care and specialist visits, past discharge summaries as available in EHR, including Care Everywhere. Prior diagnostic testing as pertinent to current admission diagnoses Updated medications and problem lists for reconciliation ED course, including vitals, labs, imaging, treatment and response to treatment Triage notes, nursing and pharmacy notes and ED provider's notes Notable results as noted in HPI   Assessment and Plan: * COPD with acute bronchitis ([place]) Continue home inhalers and Flonase Scheduled bronchodilators with as needed albuterol Antitussives, decongestants and supportive care   Acute on chronic diastolic CHF (congestive heart failure) (HCC) Mild volume overload secondary to missed dialysis Patient with orthopnea, BNP 245 She will improve with dialysis in the a.m. Continue home carvedilol   Hyperkalemia Mildly elevated potassium of 5.3 Should improve with dialysis in the next couple hours  Anemia due to end stage renal disease (HCC) Hemoglobin at baseline  CAD (coronary artery disease) CAD with history of MI s/p stents x2 No complaints of chest pain and EKG nonacute.  Troponin 13 Continue aspirin, atorvastatin, carvedilol with nitroglycerin as needed  OSA (obstructive sleep apnea) CPAP nightly  Essential hypertension BP fair control.  Continue carvedilol  DVT prophylaxis: heparin  Consults: Nephrology, Dr. Murlean Iba  Advance Care Planning:   Code Status: Prior   Family Communication: none  Disposition Plan: Back to previous home environment  Severity of Illness: The appropriate patient status for this patient is OBSERVATION. Observation status is judged  to be reasonable and necessary in order to provide the required intensity of service to ensure the patient's safety. The patient's presenting symptoms, physical exam findings, and initial radiographic and laboratory data in the context of their medical condition is felt to place them at decreased risk for further clinical deterioration. Furthermore, it is anticipated that the patient will be medically stable for discharge from the hospital within 2 midnights of admission.   Author: Athena Masse, MD 04/15/2022 4:15 AM  For on call review [email or web address].

## 2022-04-15 NOTE — ED Notes (Signed)
Pt returned to room with use of walker

## 2022-04-15 NOTE — Assessment & Plan Note (Signed)
Colostomy care 

## 2022-04-15 NOTE — ED Notes (Signed)
Pt ambulatory to restroom with use of walker. Pt provided with brief and wash cloths per request

## 2022-04-15 NOTE — Assessment & Plan Note (Signed)
See above. 

## 2022-04-16 DIAGNOSIS — I5033 Acute on chronic diastolic (congestive) heart failure: Secondary | ICD-10-CM

## 2022-04-16 DIAGNOSIS — J441 Chronic obstructive pulmonary disease with (acute) exacerbation: Secondary | ICD-10-CM | POA: Diagnosis not present

## 2022-04-16 DIAGNOSIS — N186 End stage renal disease: Secondary | ICD-10-CM | POA: Diagnosis not present

## 2022-04-16 DIAGNOSIS — Z992 Dependence on renal dialysis: Secondary | ICD-10-CM | POA: Diagnosis not present

## 2022-04-16 DIAGNOSIS — J209 Acute bronchitis, unspecified: Secondary | ICD-10-CM | POA: Diagnosis not present

## 2022-04-16 LAB — RESPIRATORY PANEL BY PCR

## 2022-04-16 LAB — BASIC METABOLIC PANEL
Anion gap: 12 (ref 5–15)
BUN: 40 mg/dL — ABNORMAL HIGH (ref 8–23)
CO2: 29 mmol/L (ref 22–32)
Calcium: 8 mg/dL — ABNORMAL LOW (ref 8.9–10.3)
Chloride: 91 mmol/L — ABNORMAL LOW (ref 98–111)
Creatinine, Ser: 6.82 mg/dL — ABNORMAL HIGH (ref 0.44–1.00)
GFR, Estimated: 6 mL/min — ABNORMAL LOW (ref >60)
Glucose, Bld: 218 mg/dL — ABNORMAL HIGH (ref 70–99)
Potassium: 5.4 mmol/L — ABNORMAL HIGH (ref 3.5–5.1)
Sodium: 132 mmol/L — ABNORMAL LOW (ref 135–145)

## 2022-04-16 LAB — GLUCOSE, CAPILLARY
Glucose-Capillary: 213 mg/dL — ABNORMAL HIGH (ref 70–99)
Glucose-Capillary: 246 mg/dL — ABNORMAL HIGH (ref 70–99)

## 2022-04-16 LAB — CBC
HCT: 34.2 % — ABNORMAL LOW (ref 36.0–46.0)
Hemoglobin: 10.6 g/dL — ABNORMAL LOW (ref 12.0–15.0)
MCH: 25 pg — ABNORMAL LOW (ref 26.0–34.0)
MCHC: 31 g/dL (ref 30.0–36.0)
MCV: 80.7 fL (ref 80.0–100.0)
Platelets: 184 10*3/uL (ref 150–400)
RBC: 4.24 MIL/uL (ref 3.87–5.11)
RDW: 19.3 % — ABNORMAL HIGH (ref 11.5–15.5)
WBC: 5.7 10*3/uL (ref 4.0–10.5)
nRBC: 0 % (ref 0.0–0.2)

## 2022-04-16 MED ORDER — BLOOD GLUCOSE TEST STRIPS 333 VI STRP: ORAL_STRIP | 0 refills | Status: DC

## 2022-04-16 MED ORDER — NOVOLIN N FLEXPEN 100 UNIT/ML ~~LOC~~ SUPN: 100.0000 [IU] | PEN_INJECTOR | SUBCUTANEOUS | 0 refills | Status: DC

## 2022-04-16 MED ORDER — INSULIN ASPART 100 UNIT/ML IJ SOLN
0.0000 [IU] | Freq: Three times a day (TID) | INTRAMUSCULAR | Status: DC
  Administered 2022-04-16: 2 [IU] via SUBCUTANEOUS
  Filled 2022-04-16: qty 1

## 2022-04-16 MED ORDER — INSULIN PEN NEEDLE 32G X 4 MM MISC: 0 refills | Status: DC

## 2022-04-16 NOTE — Care Management Obs Status (Signed)
[place] NOTIFICATION   Patient Details  Name: [name] MRN: [id number] Date of Birth: Sep 02, 1955   Medicare Observation Status Notification Given:  Yes    Candie Chroman, LCSW 04/16/2022, 10:48 AM

## 2022-04-16 NOTE — Inpatient Diabetes Management (Signed)
Inpatient Diabetes Program Recommendations  AACE/ADA: New Consensus Statement on Inpatient Glycemic Control  Target Ranges:  Prepandial:   less than 140 mg/dL      Peak postprandial:   less than 180 mg/dL (1-2 hours)      Critically ill patients:  140 - 180 mg/dL    Latest Reference Range & Units 04/15/22 20:25 04/16/22 08:41  Glucose-Capillary 70 - 99 mg/dL 199 (H) 213 (H)   Review of Glycemic Control  Diabetes history: DM2 Outpatient Diabetes medications: Novolin N 140 units QAM Current orders for Inpatient glycemic control: Novolog 0-9 units TID with meals  Inpatient Diabetes Program Recommendations:    Outpatient DM medications: Patient reports that she is prescribed Novolin N 140 units QAM but she does not take the insulin every day and notes that she experiences hypoglycemia frequently if she takes the full dose of Novolin N 140 units.  Encouraged patient to call [facility] to ask about getting an appointment with a new endocrinologist in the office to establish care with them.  Outpatient DM: Patient reports she needs Rx for Novolin N insulin pens (#[id number]), insulin pen needles (#[id number]), and test strips (#[id number]).  NOTE: Spoke with patient over the phone to inquire about outpatient DM medication regimen. Patient states that she is taking Novolin N 140 units QAM for DM but she admits that she frequently skips it based on her glucose. Patient states that she does not take the Novolin N unless her glucose is running high. She reports that when she takes the Novolin N 140 units, she experiences hypoglycemia frequently. Patient reports that she last took Novolin N 140 units on 04/14/22 at home.  Patient reports she was seeing Dr. Loanne Drilling (Endocrinologist) and last seen him in April 2023; he has retired so she does not have an Musician currently. Informed patient that she needs to call [facility] to ask if they can make her an appointment with one of the  Endocrinologist in the office so she can establish are with a new Endocrinologist in the practice.  Discussed that 140 units of Novolin N once a day is a large dose to take one time a day and if she is experiencing hypoglycemia when she takes that much, she likely needs to have adjustments made with her insulin regimen. Patient reports that she is almost out of Novolin N insulin pens, pen needles, and test strips and she would like to get Rx for them at discharge. Encouraged patient to check glucose at least 2-3 times per day and keep a record of glucose and insulin taken which she needs to take to her appointments with Endocrinologist so they can use the information to adjust her outpatient DM medication regimen.  Patient verbalized understanding of information and has no questions at this time.  Thanks, Barnie Alderman, RN, MSN, [place] Diabetes Coordinator Inpatient Diabetes Program [phone] (Team Pager from 8am to [place])

## 2022-04-16 NOTE — TOC Initial Note (Signed)
Transition of Care [facility]) - Initial/Assessment Note    Patient Details  Name: [name] MRN: [id number] Date of Birth: 08/15/1955  [facility] CM/SW Contact:    Candie Chroman, LCSW Phone Number: 04/16/2022, 11:03 AM  Clinical Narrative: CSW met with patient. No supports at bedside. CSW introduced role. Patient has SDOH flag for food insecurity. Patient gets food stamps and stated her daughter always makes sure she has food. Patient lives in [place] so gave information on [employer] and hot meals in case needed. Patient also interested in an outpatient case manager. [employer] provides this service as well. Sent secure chat to [facility] case manager to see if patient may be eligible for their services. Patient asked about assistance with getting diabetic shoes. Per [facility] coworker, patient would need to call her Medicare because as long as she has a prescription, Medicare can cover it. Patient will follow up on this. No further concerns. CSW encouraged patient to contact CSW as needed. CSW will continue to follow patient for support and facilitate return home once stable. Daughter will transport her home at discharge.                 Expected Discharge Plan: Home/Self Care Barriers to Discharge: Continued Medical Work up   Patient Goals and CMS Choice     Choice offered to / list presented to : NA  Expected Discharge Plan and Services Expected Discharge Plan: Home/Self Care     Post Acute Care Choice: NA Living arrangements for the past 2 months: Single Family Home                                      Prior Living Arrangements/Services Living arrangements for the past 2 months: Single Family Home   Patient language and need for interpreter reviewed:: Yes Do you feel safe going back to the place where you live?: Yes      Need for Family Participation in Patient Care: Yes (Comment)     Criminal Activity/Legal  Involvement Pertinent to Current Situation/Hospitalization: No - Comment as needed  Activities of Daily Living Home Assistive Devices/Equipment: Cane (specify quad or straight) ADL Screening (condition at time of admission) Patient's cognitive ability adequate to safely complete daily activities?: Yes Is the patient deaf or have difficulty hearing?: No Does the patient have difficulty seeing, even when wearing glasses/contacts?: No Does the patient have difficulty concentrating, remembering, or making decisions?: No Patient able to express need for assistance with ADLs?: Yes Does the patient have difficulty dressing or bathing?: No Independently performs ADLs?: Yes (appropriate for developmental age) Does the patient have difficulty walking or climbing stairs?: Yes Weakness of Legs: Both Weakness of Arms/Hands: None  Permission Sought/Granted                  Emotional Assessment Appearance:: Appears stated age Attitude/Demeanor/Rapport: Engaged, Gracious Affect (typically observed): Accepting, Appropriate, Calm, Pleasant Orientation: : Oriented to Self, Oriented to Place, Oriented to  Time, Oriented to Situation Alcohol / Substance Use: Not Applicable Psych Involvement: No (comment)  Admission diagnosis:  Shortness of breath [R06.02] ESRD (end stage renal disease) ([place]) [N18.6] Volume overload [E87.70] Patient Active Problem List   Diagnosis Date Noted   GI bleed 09/17/2021   Severe sepsis ([place]) 04/21/2021   COVID-19 virus infection 04/20/2021   Upper GI bleed 09/17/2020   Prolonged QT  interval 09/17/2020   COPD with acute bronchitis ([place]) 09/17/2020   Acute pulmonary edema ([place]) 09/14/2020   Acute respiratory failure with hypoxia ([place]) 09/13/2020   Allergy, unspecified, initial encounter 03/29/2020   Anaphylactic shock, unspecified, initial encounter 03/29/2020   Volume overload 01/30/2020   Pain in left upper arm 01/18/2020   Cellulitis 01/16/2020   Anemia due to  end stage renal disease ([place]) 01/16/2020   Diabetic polyneuropathy associated with type 2 diabetes mellitus ([place]) 11/06/2019   Nystagmus 11/06/2019   Intermittent claudication ([place]) 11/06/2019   Vertigo of central origin 11/06/2019   Weakness    Hypervolemia associated with renal insufficiency 10/31/2019   BMI 45.0-49.9, adult ([place]) 10/18/2019   Ascending aortic aneurysm ([place]) 03/27/2019   Puncture wound without foreign body of left forearm, initial encounter 01/02/2019   Non-healing wound of upper extremity 12/28/2018   Unspecified open wound of left upper arm, initial encounter 46/96/2952   Complication from renal dialysis device 10/20/2018   Iron deficiency anemia, unspecified 10/18/2018   ESRD (end stage renal disease) ([place]) 06/01/2018   ARF (acute renal failure) ([place]) 05/09/2018   Colon cancer screening    LGSIL on Pap smear of cervix 01/27/2018   Gout 12/25/2017   AKI (acute kidney injury) ([place]) 12/24/2017   Hyperglycemia 12/24/2017   Acute on chronic diastolic CHF (congestive heart failure) ([place]) 10/03/2017   Chronic diastolic heart failure ([place]) 09/30/2017   Lymphedema 84/13/2440   Acute metabolic encephalopathy 05/09/2535   Aortic stenosis    Acute pain of both knees 02/12/2017   Chronic gout due to renal impairment of multiple sites without tophus 02/12/2017   Esophageal dysphagia 02/12/2017   Osteoarthritis 01/22/2017   Diabetic hyperosmolar non-ketotic state ([place]) 12/13/2016   CAD (coronary artery disease) 12/13/2016   Hyperlipidemia 12/13/2016   Hyperphosphatemia 64/40/3474   Acute diastolic CHF (congestive heart failure) (HCC)    Hyperkalemia 08/21/2016   Asthma 11/29/2015   Depression 09/05/2015   GERD (gastroesophageal reflux disease) 03/25/2015   Environmental allergies 03/14/2015   SOB (shortness of breath) 02/15/2015   Chronic hepatitis C without hepatic coma ([place]) 01/31/2015   Diabetic neuropathy ([place]) 01/31/2015   OSA (obstructive sleep apnea)  01/01/2015   Poor dentition 11/13/2014   Essential hypertension 10/08/2014   Morbid (severe) obesity due to excess calories ([place]) 10/08/2014   Shortness of breath    Localized, primary osteoarthritis 01/25/2014   Family history of diabetes mellitus 03/31/2013   Pain in limb 03/31/2013   Uncontrolled type 2 diabetes mellitus with hyperosmolar nonketotic hyperglycemia ([place]) 03/25/2012    Class: Chronic   PCP:  [facility]:   [id number] [address] [id number]: [phone]: [phone]    Social Determinants of Health (SDOH) Interventions Food Insecurity Interventions: Inpatient TOC, Other (Comment) (Gave information for [employer])  Readmission Risk Interventions    02/01/2020    3:17 PM  Readmission Risk Prevention Plan  Transportation Screening Complete  Medication Review (Press photographer Complete  PCP or Specialist appointment within 3-5 days of discharge Complete  [place] or [place]  SW Recovery Care/Counseling Consult Complete  Palliative Care Screening Not [place] Applicable

## 2022-04-16 NOTE — Discharge Summary (Addendum)
Physician Discharge Summary  [name] MVE:[id number] DOB: [birth year] DOA: 04/15/2022  PCP: [facility]  Admit date: 04/15/2022 Discharge date: 04/16/2022  Admitted From: home  Disposition:  home   Recommendations for Outpatient Follow-up:  Follow up with PCP in 1-2 weeks F/u w/ endocrinology ASAP   Home Health: no  Equipment/Devices:  Discharge Condition: stable  CODE STATUS: full  Diet recommendation: Heart Healthy / Carb Modified   Brief/Interim Summary: HPI was taken from Dr. Damita Dunnings: [name] is a 66 y.o. female with medical history significant for ESRD on HD TTS, T2DM, HTN, CAD s/p stent x2, ,diastolic CHF (EF 60 to 28% 09/2021), asthma, hep C , history of colostomy status secondary to necrotizing fasciitis abdomen s/p takedown in May 2023, who presents to the ED with sudden onset shortness of breath while lying down at home.  Patient missed her dialysis treatment earlier in the day because she was feeling unwell with a several day history of wheezing, productive cough, nasal congestion and sneezing.  COVID test done 2 days prior was negative.  She denies fever, chills or chest pain.  Denies lower extremity pain or swelling.  Denies abdominal pain ED course and data review: BP 154/65 with otherwise normal vitals.  Troponin 13, BNP 245, WBC 6.1, hemoglobin 10.1.  Potassium 5.3, COVID-negative.  EKG, personally viewed and interpreted showing sinus bradycardia at 59 with no acute ST-T wave changes.  Chest x-Dispenza showing mild right basilar scarring with otherwise no acute abnormality Patient was treated with albuterol x2 with some improvement.  Given missed dialysis, nephrology was consulted, Dr. Candiss Norse who stated he will dialyze patient in the a.m.  Hospitalist consulted for admission.   As per Dr. Jimmye Norman 104-04/16/22: Pt was found to have COPD w/ acute bronchitis and was treated w/ bronchodilators, incentive spirometry. The viral respiratory panel was neg. Of note, pt had  missed a session of HD and needed HD x2 for fluid management as pt was found to have acute on chronic CHF exacerbation. Pt did not require supplemental oxygen while inpatient. For more information, please see previous progress/consult notes.   Discharge Diagnoses:  Principal Problem:   COPD with acute bronchitis ([place]) Active Problems:   Acute on chronic diastolic CHF (congestive heart failure) (H[facility])   Hyperkalemia   Essential hypertension   OSA (obstructive sleep apnea)   CAD (coronary artery disease)   Anemia due to end stage renal disease ([facility])   Volume overload  COPD: with acute bronchitis. Continue on bronchodilators & encourage incentive spirometry. Viral respiratory panel is neg   ESRD: on HD. HD x 2 this admission as pt had missed one HD session. Nephro following and recs apprec    Acute on chronic diastolic CHF exacerbation: secondary to missed HD. Monitor I/Os. Volume management w/ HD. Continue on coreg    Hyperkalemia: management w/ HD   DM2: poorly controlled, HbA1c 7.9 in 11/2021. Continue on SSI w/ accuchecks while inpatient    ACD: secondary to ESRD. No need for a transfusion currently    Hx of CAD: w/ hx stents x 2. No chest pain. Continue on aspirin, statin, coreg.    OSA : CPAP qhs   HTN: continue on coreg   Obesity: BMI 39.3. Complicates overall care & prognosis   Discharge Instructions  Discharge Instructions     Diet - low sodium heart healthy   Complete by: As directed    Diet Carb Modified   Complete by: As directed    Discharge  instructions   Complete by: As directed    F/u w/ PCP in 1-2 weeks. F/u w/ endocrinology as soon as possible   Increase activity slowly   Complete by: As directed       Allergies as of 04/16/2022       Reactions   Morphine Itching   Tolerated hydromorphone on 07/2020 *will take along with Benadryl*   Shellfish Allergy Anaphylaxis, Swelling   Diazepam Other (See Comments)   "felt like out of body experience"         Medication List     STOP taking these medications    Rena-Vite Rx 1 MG Tabs   Vitamin D (Ergocalciferol) 1.25 MG (50000 UNIT) Caps capsule Commonly known as: DRISDOL       TAKE these medications    albuterol 108 (90 Base) MCG/ACT inhaler Commonly known as: VENTOLIN HFA Inhale 2 puffs into the lungs every 4 (four) hours as needed for wheezing or shortness of breath.   allopurinol 100 MG tablet Commonly known as: ZYLOPRIM TAKE 1 TABLET BY MOUTH ONCE DAILY.   aspirin EC 81 MG tablet Take 1 tablet (81 mg total) by mouth daily.   atorvastatin 80 MG tablet Commonly known as: LIPITOR Take 1 tablet (80 mg total) by mouth every evening.   Blood Glucose Test Strips 333 Strp Enough test stripes to test blood sugar TID x 30 days. No refills   carvedilol 12.5 MG tablet Commonly known as: COREG Take 1 tablet (12.5 mg total) by mouth 2 (two) times daily with a meal.   fluticasone 50 MCG/ACT nasal spray Commonly known as: FLONASE Place 2 sprays into both nostrils daily as needed for allergies or rhinitis.   fluticasone furoate-vilanterol 200-25 MCG/INH Aepb Commonly known as: BREO ELLIPTA Inhale 1 puff into the lungs daily.   FreeStyle Libre 14 Day Reader Kerrin Mo To check blood sugar ACHS   FreeStyle Libre 14 Day Sensor Misc To check blood sugar ACHS; change every 14 days   hydrOXYzine 25 MG tablet Commonly known as: ATARAX Take 1 tablet (25 mg total) by mouth 4 (four) times daily. What changed:  when to take this reasons to take this   Insulin Pen Needle 32G X 4 MM Misc Enough pen needles for 100 units of novolin QAM x 30 days. No refills What changed:  medication strength See the new instructions.   midodrine 10 MG tablet Commonly known as: PROAMATINE Take 10 mg by mouth daily.   naloxone 4 MG/0.1ML Liqd nasal spray kit Commonly known as: NARCAN Place 1 spray into the nose as directed.   nitroGLYCERIN 0.4 MG SL tablet Commonly known as: NITROSTAT Place  1 tablet (0.4 mg total) under the tongue every 5 (five) minutes x 3 doses as needed for chest pain.   NovoLIN N FlexPen 100 UNIT/ML Kiwkpen Generic drug: Insulin NPH (Human) (Isophane) Inject 100 Units into the skin every morning. What changed:  how much to take additional instructions   Oxycodone HCl 10 MG Tabs Take 10 mg by mouth every 6 (six) hours.   pregabalin 25 MG capsule Commonly known as: LYRICA Take 25 mg by mouth 2 (two) times daily.   valACYclovir 500 MG tablet Commonly known as: VALTREX TAKE (1) TABLET BY MOUTH EVERY OTHER DAY. What changed: See the new instructions.   Velphoro 500 MG chewable tablet Generic drug: sucroferric oxyhydroxide Chew 1,500 mg by mouth 3 (three) times daily.        Allergies  Allergen Reactions   Morphine  Itching    Tolerated hydromorphone on 07/2020 *will take along with Benadryl*   Shellfish Allergy Anaphylaxis and Swelling   Diazepam Other (See Comments)    "felt like out of body experience"    Consultations: Nephro    Procedures/Studies: DG Wrist Complete Left  Result Date: 04/15/2022 CLINICAL DATA:  Distal radius area pain. EXAM: LEFT WRIST - COMPLETE 3+ VIEW COMPARISON:  Left hand series 02/10/2016 FINDINGS: Bone mineralization is normal. There is no evidence of fracture, dislocation or significant degenerative change. Trace spurring of the triscaphe and first CMC joints is again shown. There is increased stranding edema since 2017 in the dorsal forearm and wrist. There is interval development of patchy calcifications in the distal radial and ulnar arteries extending into the hand. IMPRESSION: Dorsal soft tissue edema with increased vascular calcifications since 2017. No acute underlying bone abnormality. Electronically Signed   By: Telford Nab M.D.   On: 04/15/2022 03:29   DG Chest 2 View  Result Date: 04/14/2022 CLINICAL DATA:  Cough and shortness of breath EXAM: CHEST - 2 VIEW COMPARISON:  09/17/2021 FINDINGS: Cardiac  shadow is within normal limits. Lungs are well aerated bilaterally. Stable right basilar scarring is noted. No new focal infiltrate is seen. No bony abnormality is noted. IMPRESSION: Mild right basilar scarring.  No acute abnormality noted. Electronically Signed   By: Inez Catalina M.D.   On: 04/14/2022 23:30   (Echo, Carotid, EGD, Colonoscopy, ERCP)    Subjective: Pt c/o intermittent cough   Discharge Exam: Vitals:   04/16/22 1730 04/16/22 1732  BP:  (!) 151/61  Pulse:  63  Resp:  12  Temp: 97.7 F (36.5 C)   SpO2:     Vitals:   04/16/22 1700 04/16/22 1730 04/16/22 1732 04/16/22 1741  BP:   (!) 151/61   Pulse:   63   Resp:   12   Temp:  97.7 F (36.5 C)    TempSrc:  Oral    SpO2: 96%     Weight:    115.5 kg  Height:        General: Pt is alert, awake, not in acute distress Cardiovascular:S1/S2 +, no rubs, no gallops Respiratory: decreased breath sounds b/l otherwise clear Abdominal: Soft, NT, obese, bowel sounds + Extremities:  no cyanosis    The results of significant diagnostics from this hospitalization (including imaging, microbiology, ancillary and laboratory) are listed below for reference.     Microbiology: Recent Results (from the past 240 hour(s))  SARS Coronavirus 2 by RT PCR (hospital order, performed in [facility] hospital lab) *cepheid single result test* Anterior Nasal Swab     Status: None   Collection Time: 04/14/22 11:11 PM   Specimen: Anterior Nasal Swab  Result Value Ref Range Status   SARS Coronavirus 2 by RT PCR NEGATIVE NEGATIVE Final    Comment: (NOTE) SARS-CoV-2 target nucleic acids are NOT DETECTED.  The SARS-CoV-2 RNA is generally detectable in upper and lower respiratory specimens during the acute phase of infection. The lowest concentration of SARS-CoV-2 viral copies this assay can detect is 250 copies / mL. A negative result does not preclude SARS-CoV-2 infection and should not be used as the sole basis for treatment or  other patient management decisions.  A negative result may occur with improper specimen collection / handling, submission of specimen other than nasopharyngeal swab, presence of viral mutation(s) within the areas targeted by this assay, and inadequate number of viral copies (<250 copies / mL). A negative result  must be combined with clinical observations, patient history, and epidemiological information.  Fact Sheet for Patients:   https://www.patel.info/  Fact Sheet for Healthcare Providers: https://hall.com/  This test is not yet approved or  cleared by the [place] FDA and has been authorized for detection and/or diagnosis of SARS-CoV-2 by FDA under an Emergency Use Authorization (EUA).  This EUA will remain in effect (meaning this test can be used) for the duration of the COVID-19 declaration under Section 564(b)(1) of the Act, 21 U.S.C. section 360bbb-3(b)(1), unless the authorization is terminated or revoked sooner.  Performed at [facility], [place], [place] [address]   Respiratory (~20 pathogens) panel by PCR     Status: None   Collection Time: 04/15/22  5:28 PM   Specimen: Nasopharyngeal Swab; Respiratory  Result Value Ref Range Status   Adenovirus NOT DETECTED NOT DETECTED Final   Coronavirus 229E NOT DETECTED NOT DETECTED Final    Comment: (NOTE) The Coronavirus on the Respiratory Panel, DOES NOT test for the novel  Coronavirus (2019 nCoV)    Coronavirus HKU1 NOT DETECTED NOT DETECTED Final   Coronavirus NL63 NOT DETECTED NOT DETECTED Final   Coronavirus OC43 NOT DETECTED NOT DETECTED Final   Metapneumovirus NOT DETECTED NOT DETECTED Final   Rhinovirus / Enterovirus NOT DETECTED NOT DETECTED Final   Influenza A NOT DETECTED NOT DETECTED Final   Influenza B NOT DETECTED NOT DETECTED Final   Parainfluenza Virus 1 NOT DETECTED NOT DETECTED Final   Parainfluenza Virus 2 NOT DETECTED NOT  DETECTED Final   Parainfluenza Virus 3 NOT DETECTED NOT DETECTED Final   Parainfluenza Virus 4 NOT DETECTED NOT DETECTED Final   Respiratory Syncytial Virus NOT DETECTED NOT DETECTED Final   Bordetella pertussis NOT DETECTED NOT DETECTED Final   Bordetella Parapertussis NOT DETECTED NOT DETECTED Final   Chlamydophila pneumoniae NOT DETECTED NOT DETECTED Final   Mycoplasma pneumoniae NOT DETECTED NOT DETECTED Final    Comment: Performed at [facility], [place]. [address] [place], [place] [address]     Labs: BNP (last 3 results) Recent Labs    04/22/21 1039 04/23/21 0149 04/14/22 2342  BNP 203.7* 115.5* 696.7*   Basic Metabolic Panel: Recent Labs  Lab 04/14/22 2324 04/15/22 0922 04/15/22 1434 04/16/22 0621  NA 138 137  --  132*  K 5.3* 5.4*  --  5.4*  CL 92* 98  --  91*  CO2 29 26  --  29  GLUCOSE 154* 128*  --  218*  BUN 64* 67*  --  40*  CREATININE 9.79* 10.16* 5.53* 6.82*  CALCIUM 7.8* 7.9*  --  8.0*   Liver Function Tests: Recent Labs  Lab 04/14/22 2324  AST 35  ALT 33  ALKPHOS 152*  BILITOT 0.5  PROT 7.8  ALBUMIN 3.3*   No results for input(s): "LIPASE", "AMYLASE" in the last 168 hours. No results for input(s): "AMMONIA" in the last 168 hours. CBC: Recent Labs  Lab 04/14/22 2324 04/15/22 0922 04/15/22 1434 04/16/22 0621  WBC 6.1 4.8 4.6 5.7  NEUTROABS 3.4  --   --   --   HGB 10.1* 9.9* 10.6* 10.6*  HCT 33.6* 32.9* 34.9* 34.2*  MCV 82.0 80.2 79.7* 80.7  PLT 183 184 191 184   Cardiac Enzymes: No results for input(s): "CKTOTAL", "CKMB", "CKMBINDEX", "TROPONINI" in the last 168 hours. BNP: Invalid input(s): "POCBNP" CBG: Recent Labs  Lab 04/15/22 2025 04/16/22 0841 04/16/22 1212  GLUCAP 199* 213* 246*  D-Dimer No results for input(s): "DDIMER" in the last 72 hours. Hgb A1c No results for input(s): "HGBA1C" in the last 72 hours. Lipid Profile No results for input(s): "CHOL", "HDL", "LDLCALC", "TRIG", "CHOLHDL", "LDLDIRECT" in the last  72 hours. Thyroid function studies No results for input(s): "TSH", "T4TOTAL", "T3FREE", "THYROIDAB" in the last 72 hours.  Invalid input(s): "FREET3" Anemia work up No results for input(s): "VITAMINB12", "FOLATE", "FERRITIN", "TIBC", "IRON", "RETICCTPCT" in the last 72 hours. Urinalysis    Component Value Date/Time   COLORURINE YELLOW 03/05/2021 1425   APPEARANCEUR HAZY (A) 03/05/2021 1425   LABSPEC 1.012 03/05/2021 1425   PHURINE 5.0 03/05/2021 1425   GLUCOSEU >=500 (A) 03/05/2021 1425   HGBUR NEGATIVE 03/05/2021 [place] 03/05/2021 1425   BILIRUBINUR neg 12/17/2016 1538   KETONESUR NEGATIVE 03/05/2021 1425   PROTEINUR 30 (A) 03/05/2021 1425   UROBILINOGEN 0.2 12/17/2016 1538   UROBILINOGEN 0.2 05/29/2010 2100   NITRITE NEGATIVE 03/05/2021 1425   LEUKOCYTESUR TRACE (A) 03/05/2021 1425   Sepsis Labs Recent Labs  Lab 04/14/22 2324 04/15/22 0922 04/15/22 1434 04/16/22 0621  WBC 6.1 4.8 4.6 5.7   Microbiology Recent Results (from the past 240 hour(s))  SARS Coronavirus 2 by RT PCR (hospital order, performed in [facility] hospital lab) *cepheid single result test* Anterior Nasal Swab     Status: None   Collection Time: 04/14/22 11:11 PM   Specimen: Anterior Nasal Swab  Result Value Ref Range Status   SARS Coronavirus 2 by RT PCR NEGATIVE NEGATIVE Final    Comment: (NOTE) SARS-CoV-2 target nucleic acids are NOT DETECTED.  The SARS-CoV-2 RNA is generally detectable in upper and lower respiratory specimens during the acute phase of infection. The lowest concentration of SARS-CoV-2 viral copies this assay can detect is 250 copies / mL. A negative result does not preclude SARS-CoV-2 infection and should not be used as the sole basis for treatment or other patient management decisions.  A negative result may occur with improper specimen collection / handling, submission of specimen other than nasopharyngeal swab, presence of viral mutation(s) within  the areas targeted by this assay, and inadequate number of viral copies (<250 copies / mL). A negative result must be combined with clinical observations, patient history, and epidemiological information.  Fact Sheet for Patients:   https://www.patel.info/  Fact Sheet for Healthcare Providers: https://hall.com/  This test is not yet approved or  cleared by the [place] FDA and has been authorized for detection and/or diagnosis of SARS-CoV-2 by FDA under an Emergency Use Authorization (EUA).  This EUA will remain in effect (meaning this test can be used) for the duration of the COVID-19 declaration under Section 564(b)(1) of the Act, 21 U.S.C. section 360bbb-3(b)(1), unless the authorization is terminated or revoked sooner.  Performed at [facility], [place], [place] [address]   Respiratory (~20 pathogens) panel by PCR     Status: None   Collection Time: 04/15/22  5:28 PM   Specimen: Nasopharyngeal Swab; Respiratory  Result Value Ref Range Status   Adenovirus NOT DETECTED NOT DETECTED Final   Coronavirus 229E NOT DETECTED NOT DETECTED Final    Comment: (NOTE) The Coronavirus on the Respiratory Panel, DOES NOT test for the novel  Coronavirus (2019 nCoV)    Coronavirus HKU1 NOT DETECTED NOT DETECTED Final   Coronavirus NL63 NOT DETECTED NOT DETECTED Final   Coronavirus OC43 NOT DETECTED NOT DETECTED Final   Metapneumovirus NOT DETECTED NOT DETECTED Final   Rhinovirus /  Enterovirus NOT DETECTED NOT DETECTED Final   Influenza A NOT DETECTED NOT DETECTED Final   Influenza B NOT DETECTED NOT DETECTED Final   Parainfluenza Virus 1 NOT DETECTED NOT DETECTED Final   Parainfluenza Virus 2 NOT DETECTED NOT DETECTED Final   Parainfluenza Virus 3 NOT DETECTED NOT DETECTED Final   Parainfluenza Virus 4 NOT DETECTED NOT DETECTED Final   Respiratory Syncytial Virus NOT DETECTED NOT DETECTED Final   Bordetella  pertussis NOT DETECTED NOT DETECTED Final   Bordetella Parapertussis NOT DETECTED NOT DETECTED Final   Chlamydophila pneumoniae NOT DETECTED NOT DETECTED Final   Mycoplasma pneumoniae NOT DETECTED NOT DETECTED Final    Comment: Performed at [facility], [place] [address], [place], [place] [address]     Time coordinating discharge: Over 30 minutes  SIGNED:   Wyvonnia Dusky, MD  [facility] 04/16/2022, 6:12 PM Pager   If 7PM-7AM, please contact night-coverage

## 2022-04-16 NOTE — Progress Notes (Signed)
Hd tx of 3.5hrs completed. 84L total vol processed. No complications. Report given to joy beeman, rn. Total uf removed: 2049m Post hd v/s: 97.7 151/61(89) 63 12 97%

## 2022-04-16 NOTE — Progress Notes (Signed)
[facility], [place] 04/16/22  Subjective:   LOS: 0  Patient seen sitting at side of bed Alert and oriented Continues to have cough, non productive Reports improved respiratory status  Objective:  Vital signs in last 24 hours:  Temp:  [97.9 F (36.6 C)-98.4 F (36.9 C)] 98.3 F (36.8 C) (10/05 1217) Pulse Rate:  [59-77] 59 (10/05 1217) Resp:  [15-19] 18 (10/05 1217) BP: (107-160)/(43-78) 129/43 (10/05 1217) SpO2:  [94 %-100 %] 94 % (10/05 1217)  Weight change:  Filed Weights   04/14/22 2255  Weight: 117.5 kg    Intake/Output:    Intake/Output Summary (Last 24 hours) at 04/16/2022 1246 Last data filed at 04/16/2022 1100 Gross per 24 hour  Intake 120 ml  Output 2000 ml  Net -1880 ml    Physical Exam: General:  No acute distress  HEENT  anicteric, moist oral mucous membrane  Pulm/lungs  normal breathing effort, crackles in bases  CVS/Heart  regular rhythm, no rub or gallop  Abdomen:   Soft, nontender  Extremities:  + peripheral edema  Neurologic:  Alert, oriented, able to follow commands  Skin:  No acute rashes  Left arm AV fistula    Basic Metabolic Panel:  Recent Labs  Lab 04/14/22 2324 04/15/22 0922 04/15/22 1434 04/16/22 0621  NA 138 137  --  132*  K 5.3* 5.4*  --  5.4*  CL 92* 98  --  91*  CO2 29 26  --  29  GLUCOSE 154* 128*  --  218*  BUN 64* 67*  --  40*  CREATININE 9.79* 10.16* 5.53* 6.82*  CALCIUM 7.8* 7.9*  --  8.0*      CBC: Recent Labs  Lab 04/14/22 2324 04/15/22 0922 04/15/22 1434 04/16/22 0621  WBC 6.1 4.8 4.6 5.7  NEUTROABS 3.4  --   --   --   HGB 10.1* 9.9* 10.6* 10.6*  HCT 33.6* 32.9* 34.9* 34.2*  MCV 82.0 80.2 79.7* 80.7  PLT 183 184 191 184       Lab Results  Component Value Date   HEPBSAG NON REACTIVE 04/15/2022   HEPBSAB Reactive (A) 04/15/2022      Microbiology:  Recent Results (from the past 240 hour(s))  SARS Coronavirus 2 by RT PCR (hospital order, performed in [place] hospital lab) *cepheid single result test* Anterior Nasal Swab     Status: None   Collection Time: 04/14/22 11:11 PM   Specimen: Anterior Nasal Swab  Result Value Ref Range Status   SARS Coronavirus 2 by RT PCR NEGATIVE NEGATIVE Final    Comment: (NOTE) SARS-CoV-2 target nucleic acids are NOT DETECTED.  The SARS-CoV-2 RNA is generally detectable in upper and lower respiratory specimens during the acute phase of infection. The lowest concentration of SARS-CoV-2 viral copies this assay can detect is 250 copies / mL. A negative result does not preclude SARS-CoV-2 infection and should not be used as the sole basis for treatment or other patient management decisions.  A negative result may occur with improper specimen collection / handling, submission of specimen other than nasopharyngeal swab, presence of viral mutation(s) within the areas targeted by this assay, and inadequate number of viral copies (<250 copies / mL). A negative result must be combined with clinical observations, patient history, and epidemiological information.  Fact Sheet for Patients:   https://www.patel.info/  Fact Sheet for Healthcare Providers: https://hall.com/  This test is not yet approved or  cleared by the [place] FDA and has  been authorized for detection and/or diagnosis of SARS-CoV-2 by FDA under an Emergency Use Authorization (EUA).  This EUA will remain in effect (meaning this test can be used) for the duration of the COVID-19 declaration under Section 564(b)(1) of the Act, 21 U.S.C. section 360bbb-3(b)(1), unless the authorization is terminated or revoked sooner.  Performed at [facility], [place], [place] [address]   Respiratory (~20 pathogens) panel by PCR     Status: None   Collection Time: 04/15/22  5:28 PM   Specimen: Nasopharyngeal Swab; Respiratory  Result Value Ref Range Status   Adenovirus NOT DETECTED NOT  DETECTED Final   Coronavirus 229E NOT DETECTED NOT DETECTED Final    Comment: (NOTE) The Coronavirus on the Respiratory Panel, DOES NOT test for the novel  Coronavirus (2019 nCoV)    Coronavirus HKU1 NOT DETECTED NOT DETECTED Final   Coronavirus NL63 NOT DETECTED NOT DETECTED Final   Coronavirus OC43 NOT DETECTED NOT DETECTED Final   Metapneumovirus NOT DETECTED NOT DETECTED Final   Rhinovirus / Enterovirus NOT DETECTED NOT DETECTED Final   Influenza A NOT DETECTED NOT DETECTED Final   Influenza B NOT DETECTED NOT DETECTED Final   Parainfluenza Virus 1 NOT DETECTED NOT DETECTED Final   Parainfluenza Virus 2 NOT DETECTED NOT DETECTED Final   Parainfluenza Virus 3 NOT DETECTED NOT DETECTED Final   Parainfluenza Virus 4 NOT DETECTED NOT DETECTED Final   Respiratory Syncytial Virus NOT DETECTED NOT DETECTED Final   Bordetella pertussis NOT DETECTED NOT DETECTED Final   Bordetella Parapertussis NOT DETECTED NOT DETECTED Final   Chlamydophila pneumoniae NOT DETECTED NOT DETECTED Final   Mycoplasma pneumoniae NOT DETECTED NOT DETECTED Final    Comment: Performed at [facility], [place]. [address] [place], [place] [address]    Coagulation Studies: Recent Labs    04/14/22 2324  LABPROT 14.1  INR 1.1     Urinalysis: No results for input(s): "COLORURINE", "LABSPEC", "PHURINE", "GLUCOSEU", "HGBUR", "BILIRUBINUR", "KETONESUR", "PROTEINUR", "UROBILINOGEN", "NITRITE", "LEUKOCYTESUR" in the last 72 hours.  Invalid input(s): "APPERANCEUR"    Imaging: DG Wrist Complete Left  Result Date: 04/15/2022 CLINICAL DATA:  Distal radius area pain. EXAM: LEFT WRIST - COMPLETE 3+ VIEW COMPARISON:  Left hand series 02/10/2016 FINDINGS: Bone mineralization is normal. There is no evidence of fracture, dislocation or significant degenerative change. Trace spurring of the triscaphe and first CMC joints is again shown. There is increased stranding edema since 2017 in the dorsal forearm and wrist. There  is interval development of patchy calcifications in the distal radial and ulnar arteries extending into the hand. IMPRESSION: Dorsal soft tissue edema with increased vascular calcifications since 2017. No acute underlying bone abnormality. Electronically Signed   By: Telford Nab M.D.   On: 04/15/2022 03:29   DG Chest 2 View  Result Date: 04/14/2022 CLINICAL DATA:  Cough and shortness of breath EXAM: CHEST - 2 VIEW COMPARISON:  09/17/2021 FINDINGS: Cardiac shadow is within normal limits. Lungs are well aerated bilaterally. Stable right basilar scarring is noted. No new focal infiltrate is seen. No bony abnormality is noted. IMPRESSION: Mild right basilar scarring.  No acute abnormality noted. Electronically Signed   By: Inez Catalina M.D.   On: 04/14/2022 23:30     Medications:     aspirin EC  81 mg Oral Daily   atorvastatin  80 mg Oral QHS   carvedilol  12.5 mg Oral BID WC   Chlorhexidine Gluconate Cloth  6 each Topical Q0600  fluticasone furoate-vilanterol  1 puff Inhalation Daily   guaiFENesin  600 mg Oral BID   heparin  5,000 Units Subcutaneous Q8H   insulin aspart  0-6 Units Subcutaneous TID WC   ipratropium-albuterol  3 mL Nebulization BID   pregabalin  25 mg Oral BID   valACYclovir  500 mg Oral QODAY   acetaminophen **OR** acetaminophen, albuterol, heparin, ipratropium-albuterol, nitroGLYCERIN, oxyCODONE  Assessment/ Plan:  66 y.o. female with end-stage renal disease on hemodialysis, diabetes type 2, hypertension, coronary disease status post stent placement, history of MI in 2017, peripheral arterial disease, hyperlipidemia, GERD, chronic combined systolic and diastolic CHF, gout, asthma, hepatitis C, anemia, history of colostomy was admitted on 04/15/2022 for  Principal Problem:   COPD with acute bronchitis ([place]) Active Problems:   Essential hypertension   OSA (obstructive sleep apnea)   Hyperkalemia   CAD (coronary artery disease)   Acute on chronic diastolic CHF  (congestive heart failure) (HCC)   Anemia due to end stage renal disease (HCC)   Volume overload  Shortness of breath [R06.02] ESRD (end stage renal disease) ([place]) [N18.6] Volume overload [E87.70]  #.  Shortness of breath in the setting of ESRD, possibly from volume overload.    Weaned to room air after dialysis yesterday. UF goal 2L achieved. Will receive dialysis today to maintain outpatient schedule and manage remaining fluid. If stable, patient will be cleared to discharge after dialysis.   #Hyperkalemia Potassium remains 5.4, will manage with dialysis   #. Anemia of CKD  Lab Results  Component Value Date   HGB 10.6 (L) 04/16/2022  Hgb within desired target Low dose EPO with HD  #. Secondary hyperparathyroidism of renal origin N 25.81      Component Value Date/Time   PTH 232 (H) 08/16/2017 0501   Lab Results  Component Value Date   PHOS 4.4 12/20/2021   Monitor calcium and phos level during this admission   #. Diabetes type 2 with CKD Hemoglobin A1C (%)  Date Value  10/28/2021 8.1 (A)   Hgb A1c MFr Bld (%)  Date Value  04/21/2021 12.8 (H)     LOS: 0 Colon Flattery 10/5/202312:46 PM  [place], [place]

## 2022-04-17 LAB — HEPATITIS B SURFACE ANTIBODY, QUANTITATIVE: Hep B S AB Quant (Post): 26.8 m[IU]/mL (ref >9.9)

## 2022-05-11 ENCOUNTER — Encounter (INDEPENDENT_AMBULATORY_CARE_PROVIDER_SITE_OTHER): Payer: Self-pay

## 2022-05-19 IMAGING — US US EXTREM LOW VENOUS
1 series · 13 of 24 positions shown · non-contrast
Comparison: None.

CLINICAL DATA: Initial evaluation for acute swelling, pain.



[Series 1: us venous img lower bilat (dvt) · portal-venous · 13 of 52 slices shown]
[im 1/52]
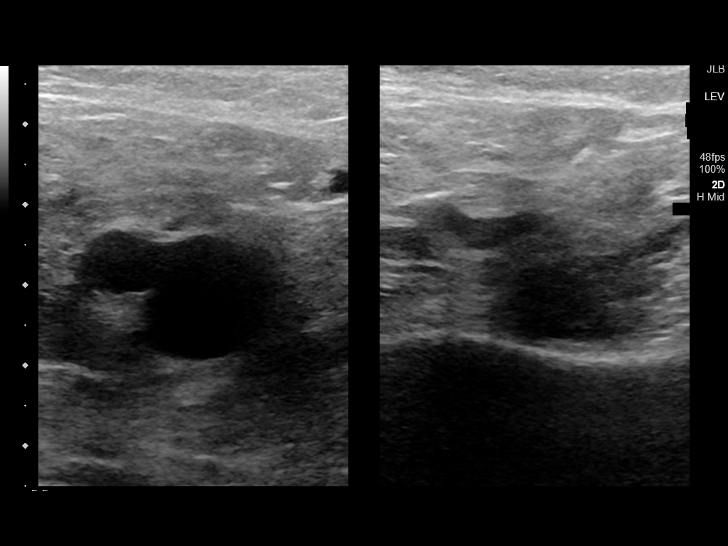
[im 5/52]
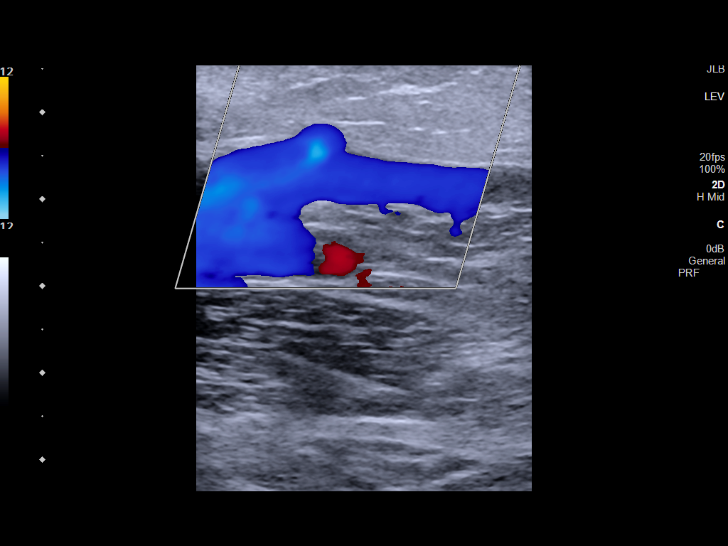
[im 9/52]
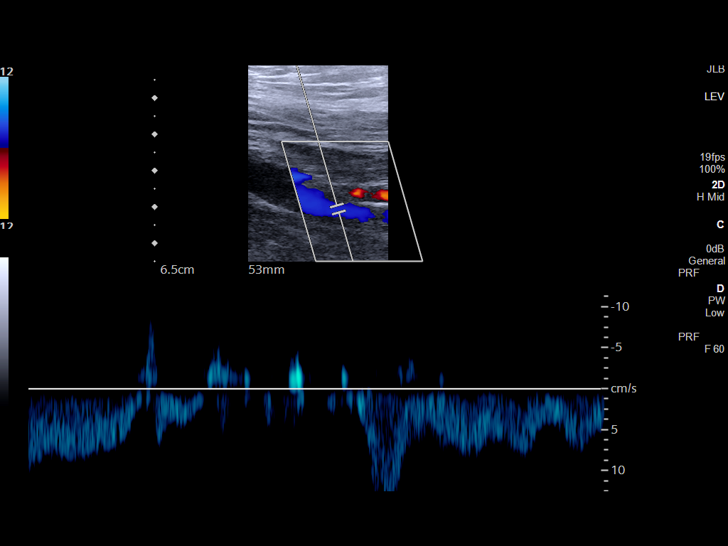
[im 14/52]
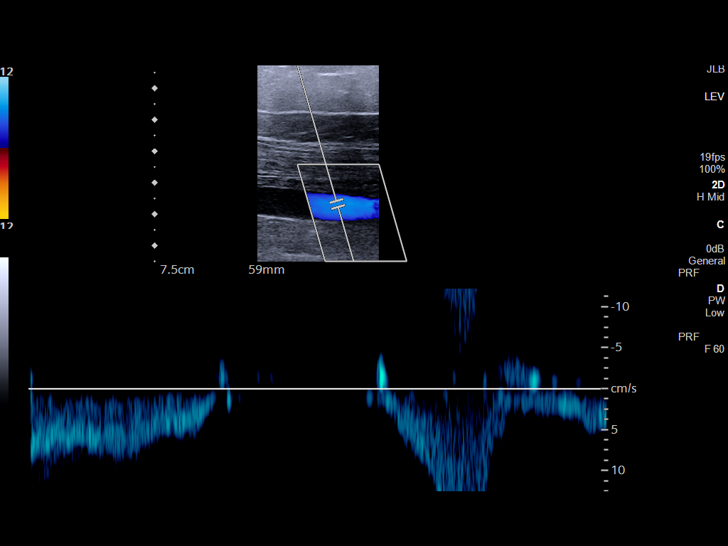
[im 18/52]
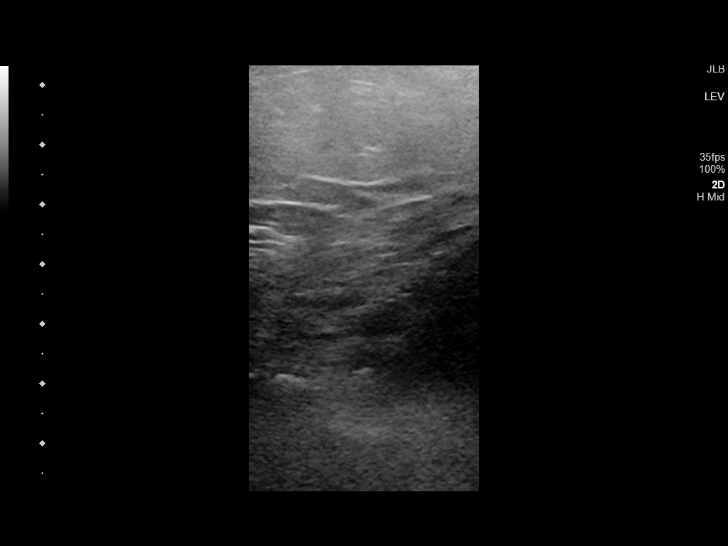
[im 23/52]
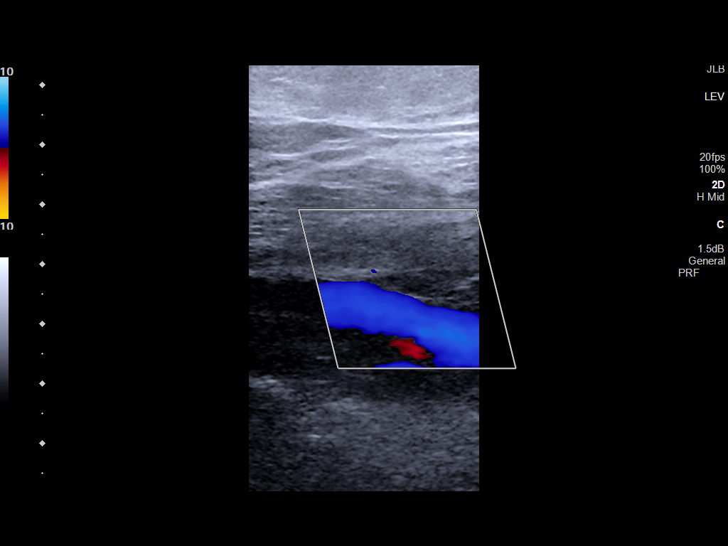
[im 27/52]
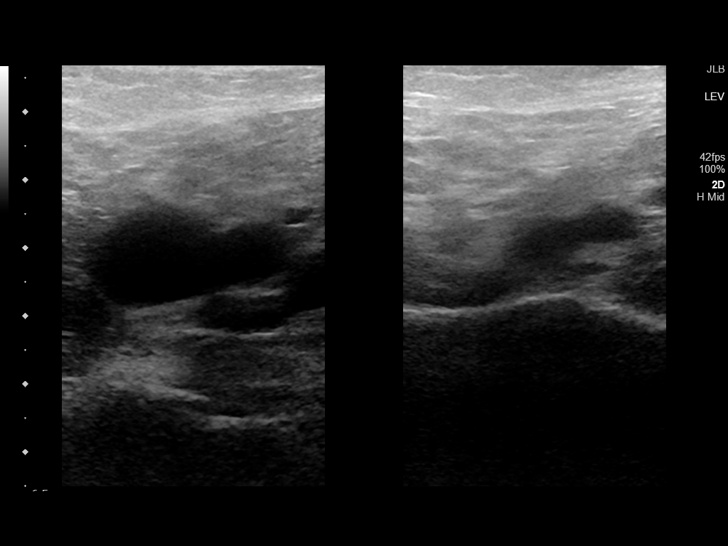
[im 29/52]
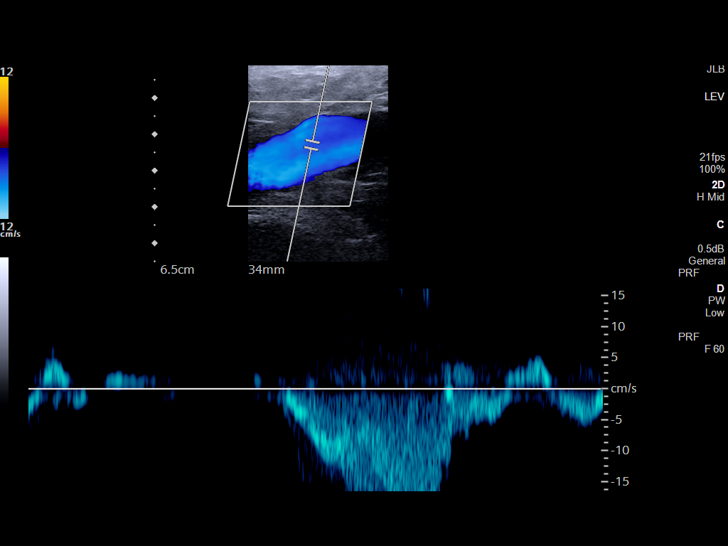
[im 34/52]
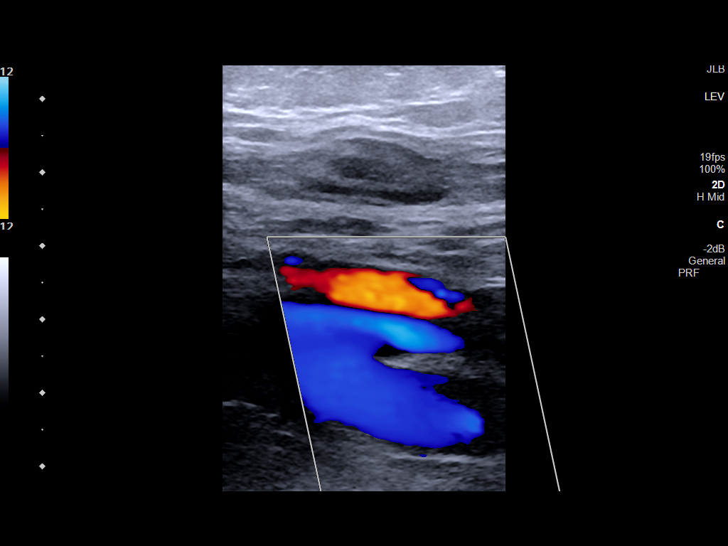
[im 38/52]
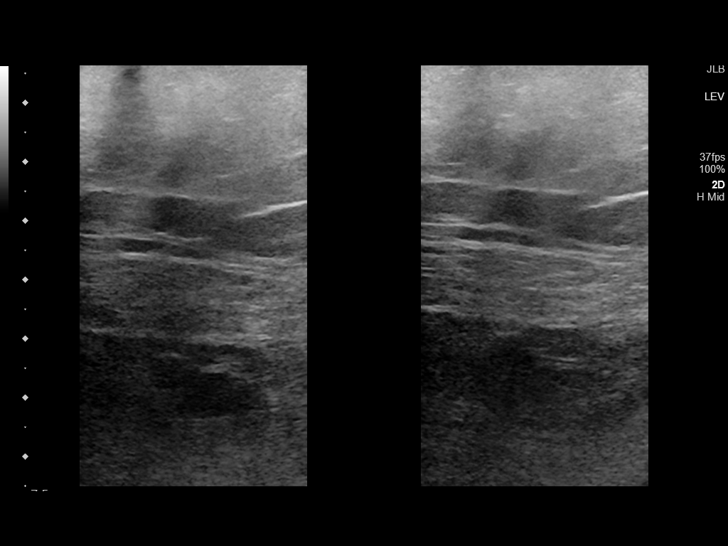
[im 43/52]
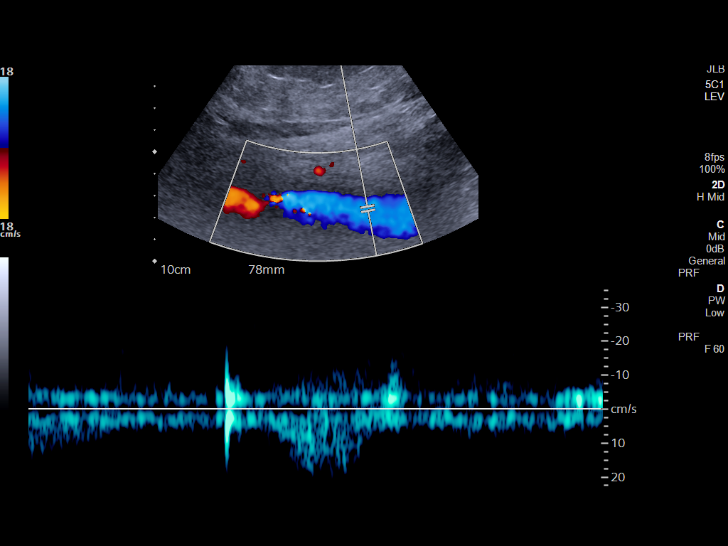
[im 47/52]
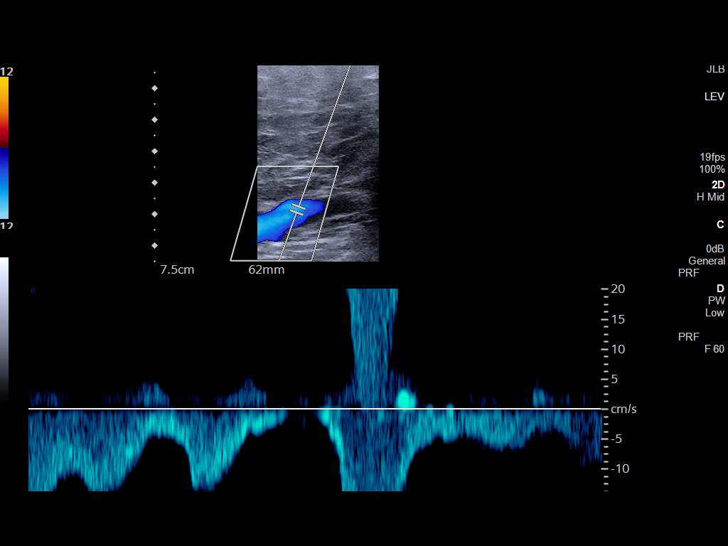
[im 52/52]
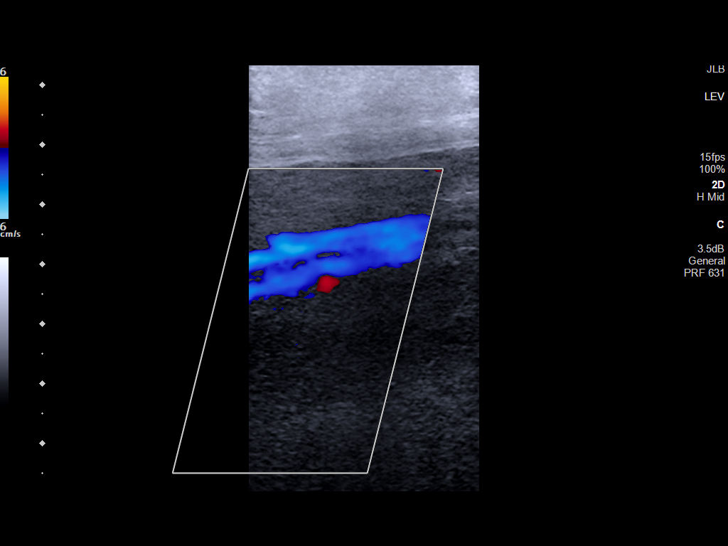

[13 of 24 positions shown; findings below may reference images not displayed]

FINDINGS: RIGHT LOWER EXTREMITY

Common Femoral Vein: No evidence of thrombus. Normal
compressibility, respiratory phasicity and response to augmentation.

Saphenofemoral Junction: No evidence of thrombus. Normal
compressibility and flow on color Doppler imaging.

Profunda Femoral Vein: No evidence of thrombus. Normal
compressibility and flow on color Doppler imaging.

Femoral Vein: No evidence of thrombus. Normal compressibility,
respiratory phasicity and response to augmentation.

Popliteal Vein: No evidence of thrombus. Normal compressibility,
respiratory phasicity and response to augmentation.

Calf Veins: No evidence of thrombus. Normal compressibility and flow
on color Doppler imaging. Peroneal vein not visualized.

Superficial Great Saphenous Vein: No evidence of thrombus. Normal
compressibility.

Venous Reflux:  None.

Other Findings:  None.

LEFT LOWER EXTREMITY

Common Femoral Vein: No evidence of thrombus. Normal
compressibility, respiratory phasicity and response to augmentation.

Saphenofemoral Junction: No evidence of thrombus. Normal
compressibility and flow on color Doppler imaging.

Profunda Femoral Vein: No evidence of thrombus. Normal
compressibility and flow on color Doppler imaging.

Femoral Vein: No evidence of thrombus. Normal compressibility,
respiratory phasicity and response to augmentation.

Popliteal Vein: No evidence of thrombus. Normal compressibility,
respiratory phasicity and response to augmentation.

Calf Veins: No evidence of thrombus. Normal compressibility and flow
on color Doppler imaging. Peroneal vein not visualized.

Superficial Great Saphenous Vein: No evidence of thrombus. Normal
compressibility.

Venous Reflux:  None.

Other Findings:  None.
IMPRESSION: No evidence of deep venous thrombosis in either lower extremity.

## 2022-05-19 IMAGING — DX DG CHEST 1V PORT
1 series · 1 of 1 positions shown · non-contrast
Comparison: 10/31/2019

CLINICAL DATA: Lower extremity pain and swelling

EXAM:
PORTABLE CHEST 1 VIEW

[chest ap]
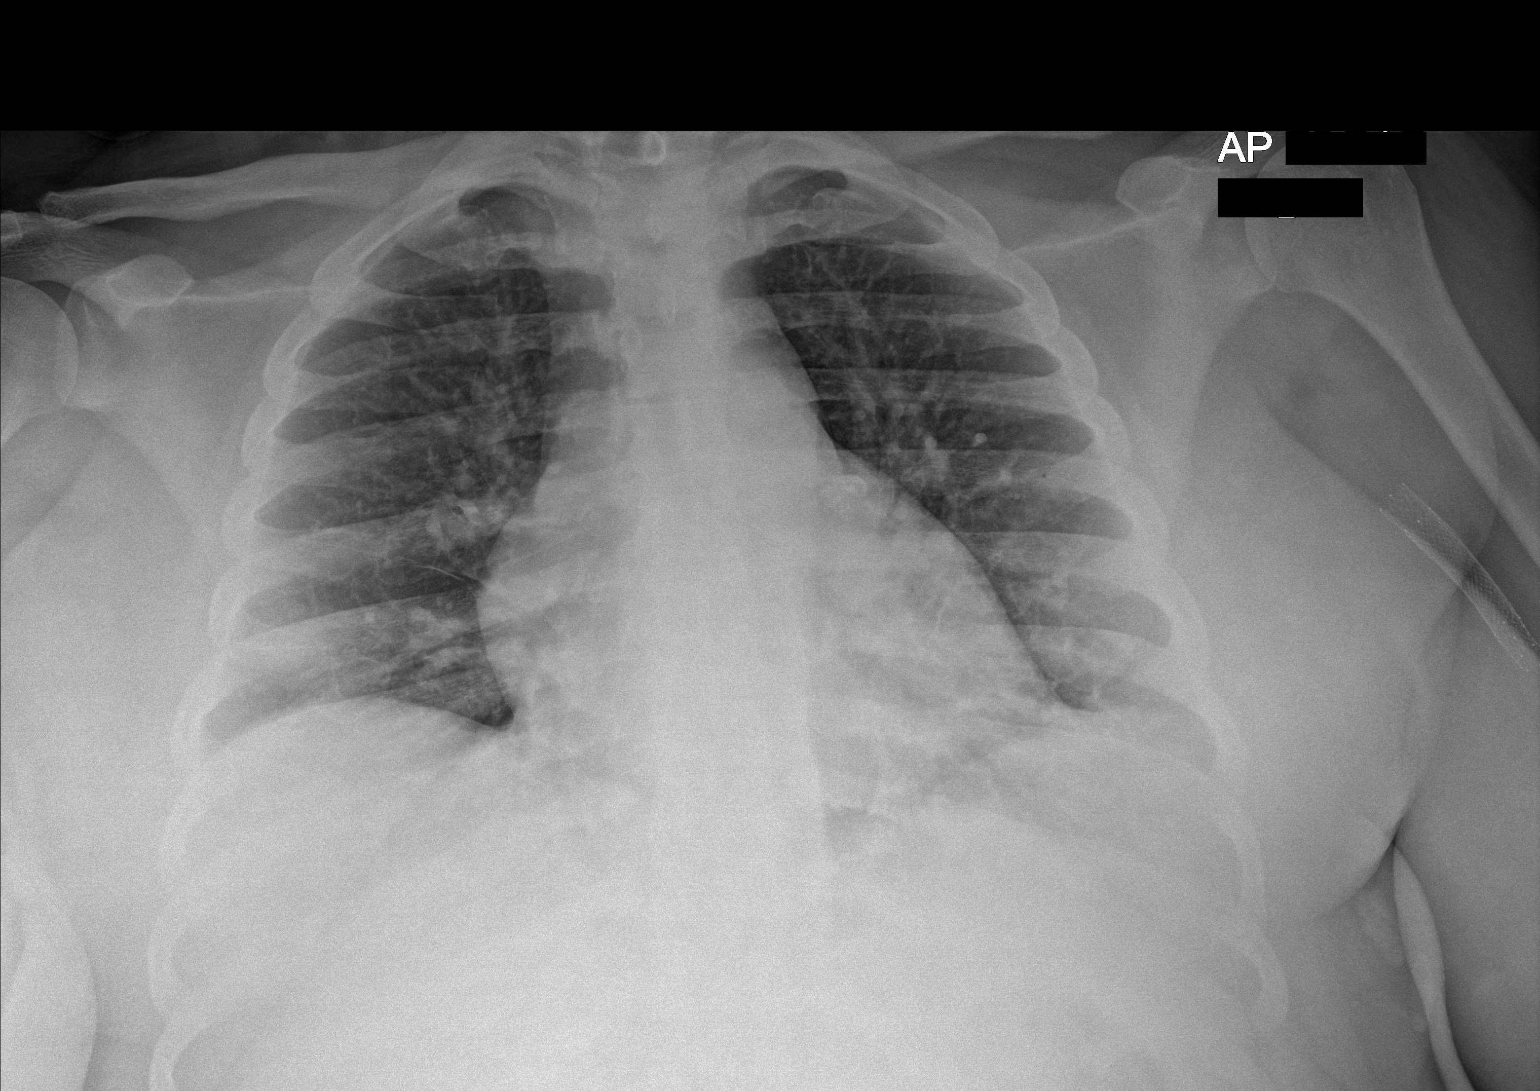

[1 of 1 positions shown; findings below may reference images not displayed]

FINDINGS: The heart size and mediastinal contours are within normal limits.
Both lungs are clear. The visualized skeletal structures are
unremarkable.
IMPRESSION: No active disease.

## 2022-05-20 IMAGING — DX DG KNEE 1-2V*L*
2 series · 2 of 2 positions shown · non-contrast
Comparison: None.

CLINICAL DATA: Bilateral knee pain

EXAM:
LEFT KNEE - 1-2 VIEW

[knee lat]
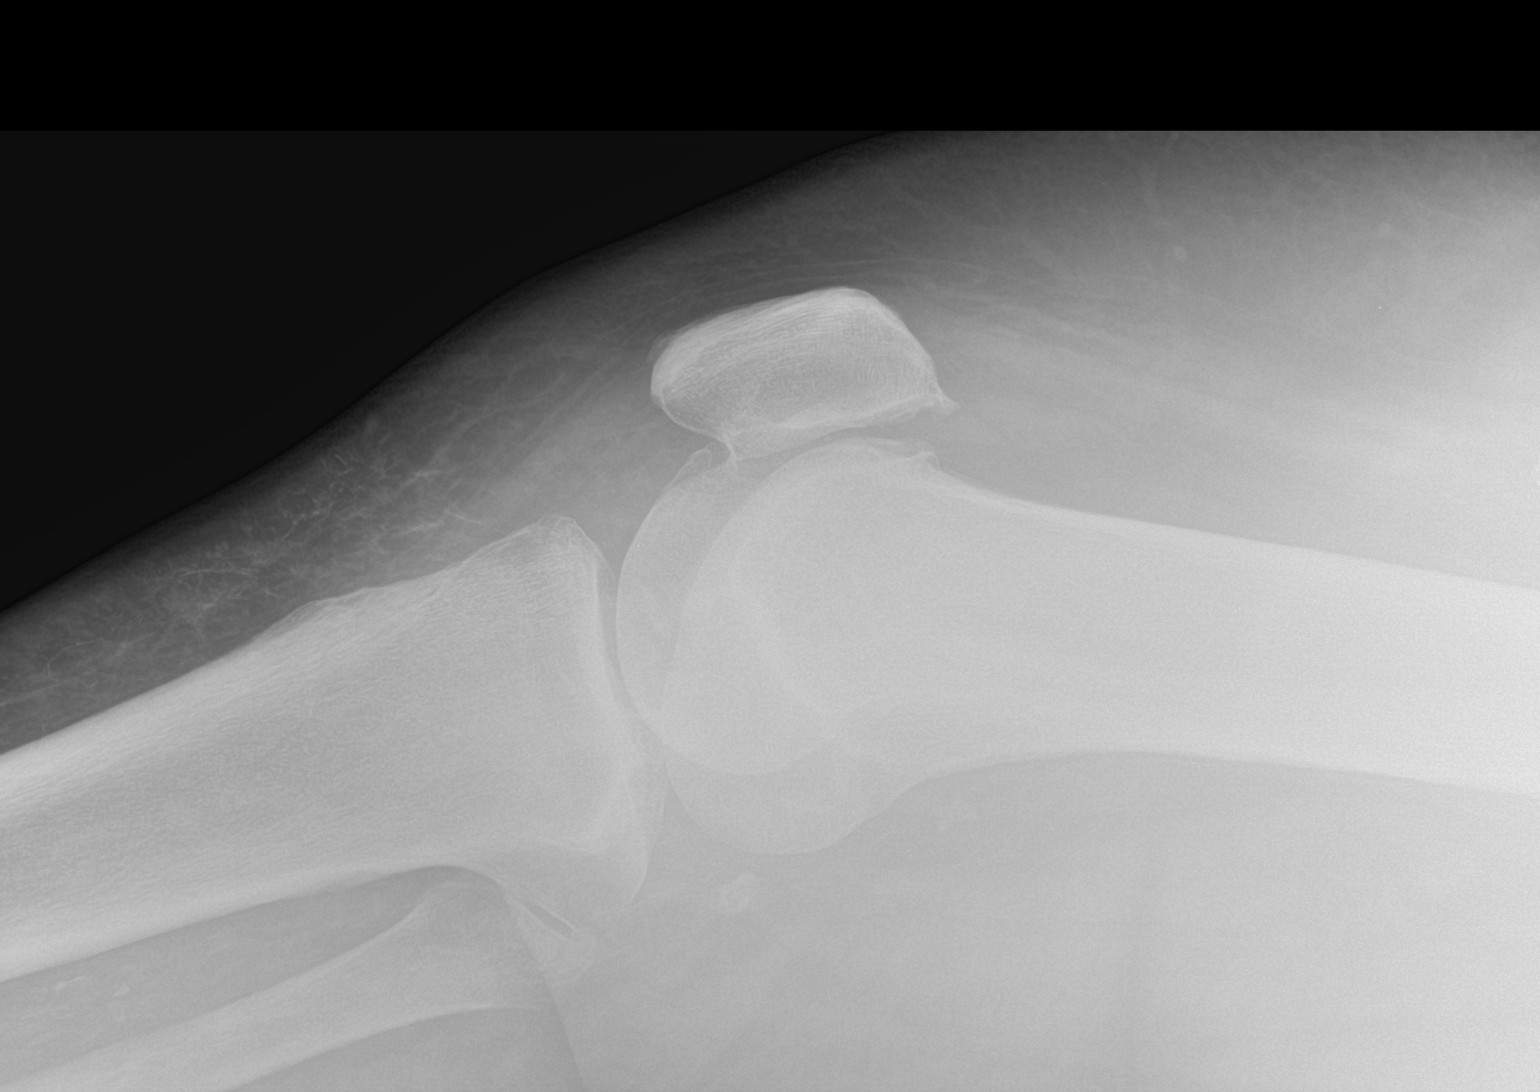

[knee ap]
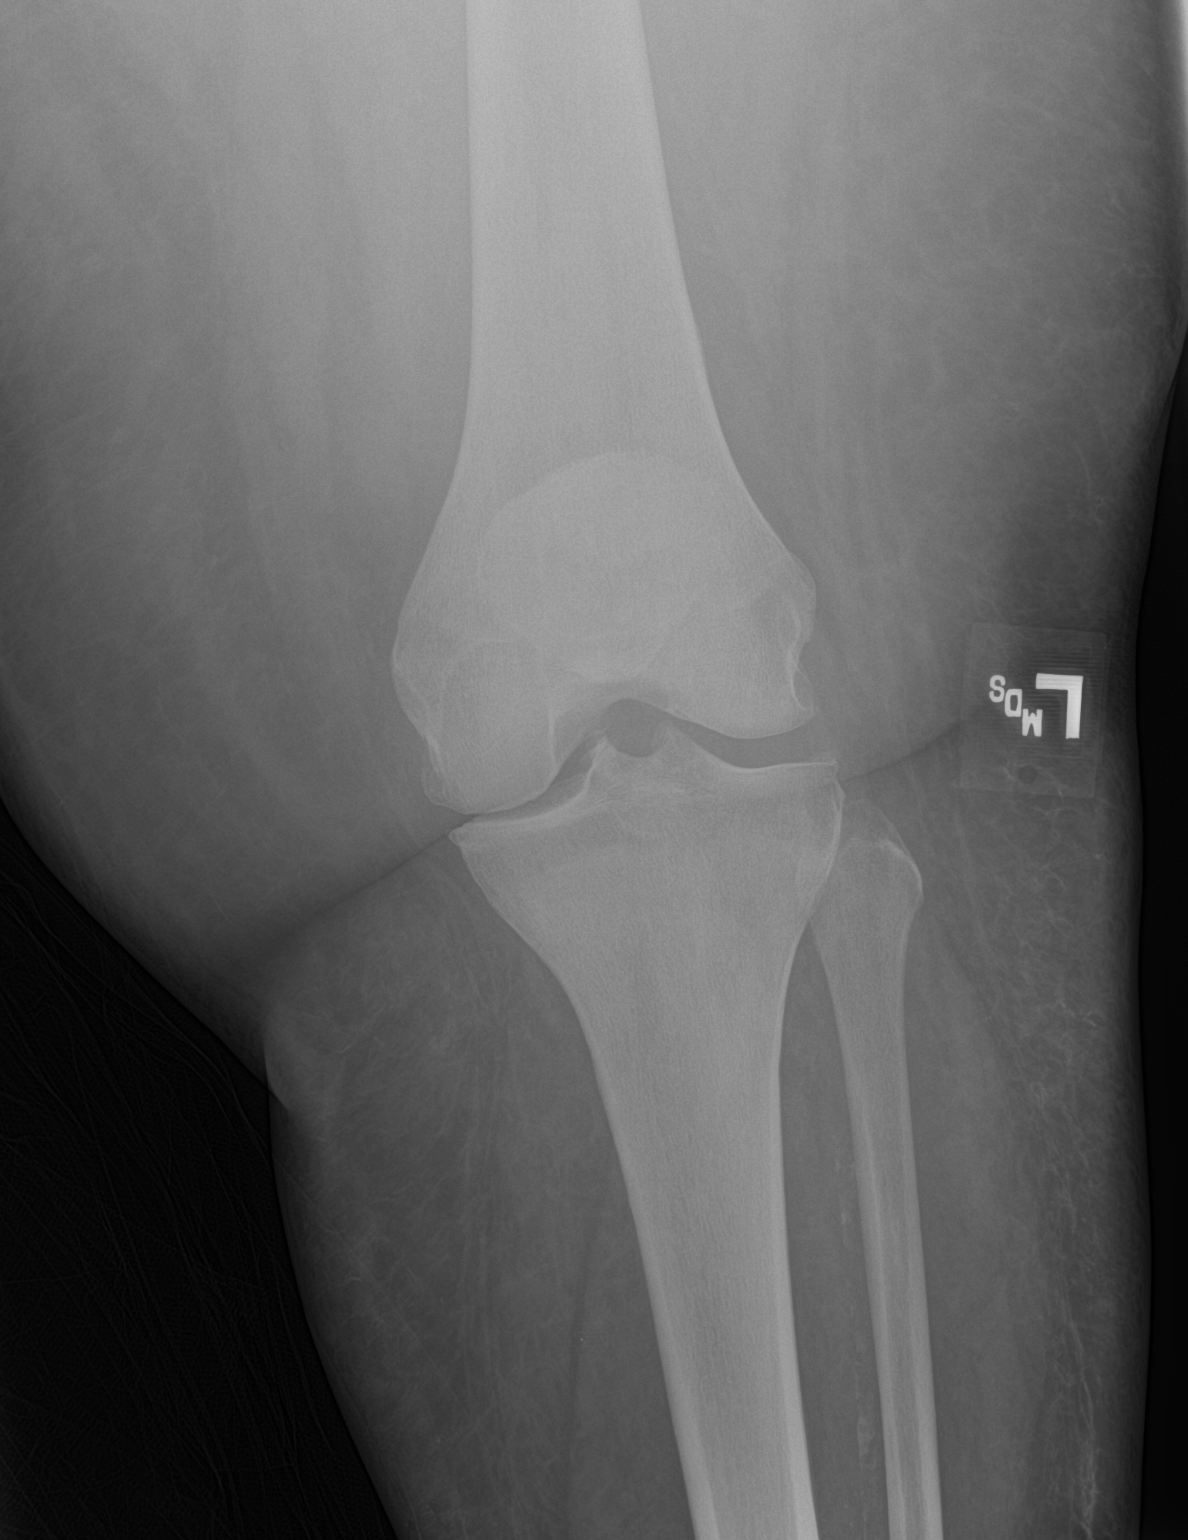

[2 of 2 positions shown; findings below may reference images not displayed]

FINDINGS: No fracture or malalignment. Moderate medial, mild lateral and mild
patellofemoral degenerative changes. Trace knee effusion.
Generalized subcutaneous edema.
IMPRESSION: Tricompartment arthritis with trace knee effusion.

## 2022-05-20 IMAGING — DX DG KNEE 1-2V*R*
2 series · 2 of 2 positions shown · non-contrast
Comparison: 10/31/2019

CLINICAL DATA: Bilateral knee pain

EXAM:
RIGHT KNEE - 1-2 VIEW

[knee ap]
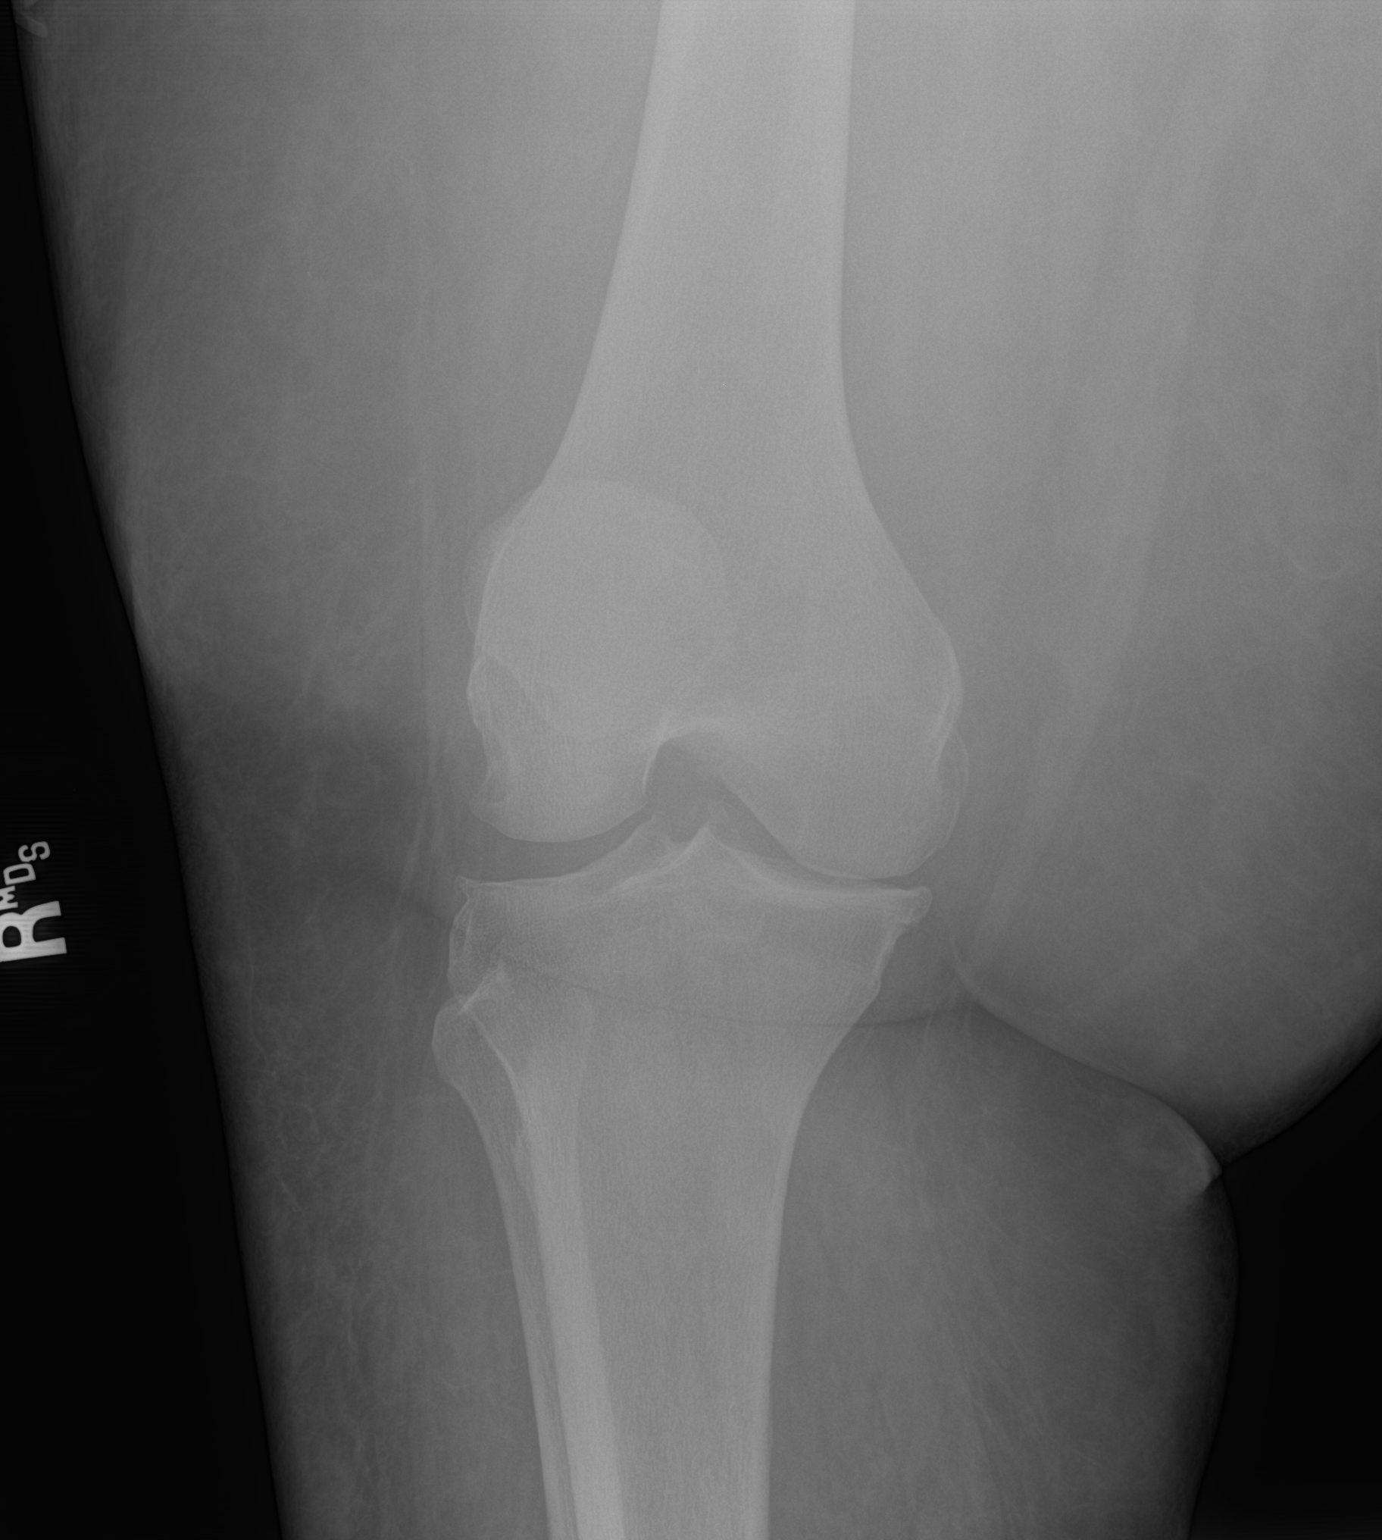

[knee lat]
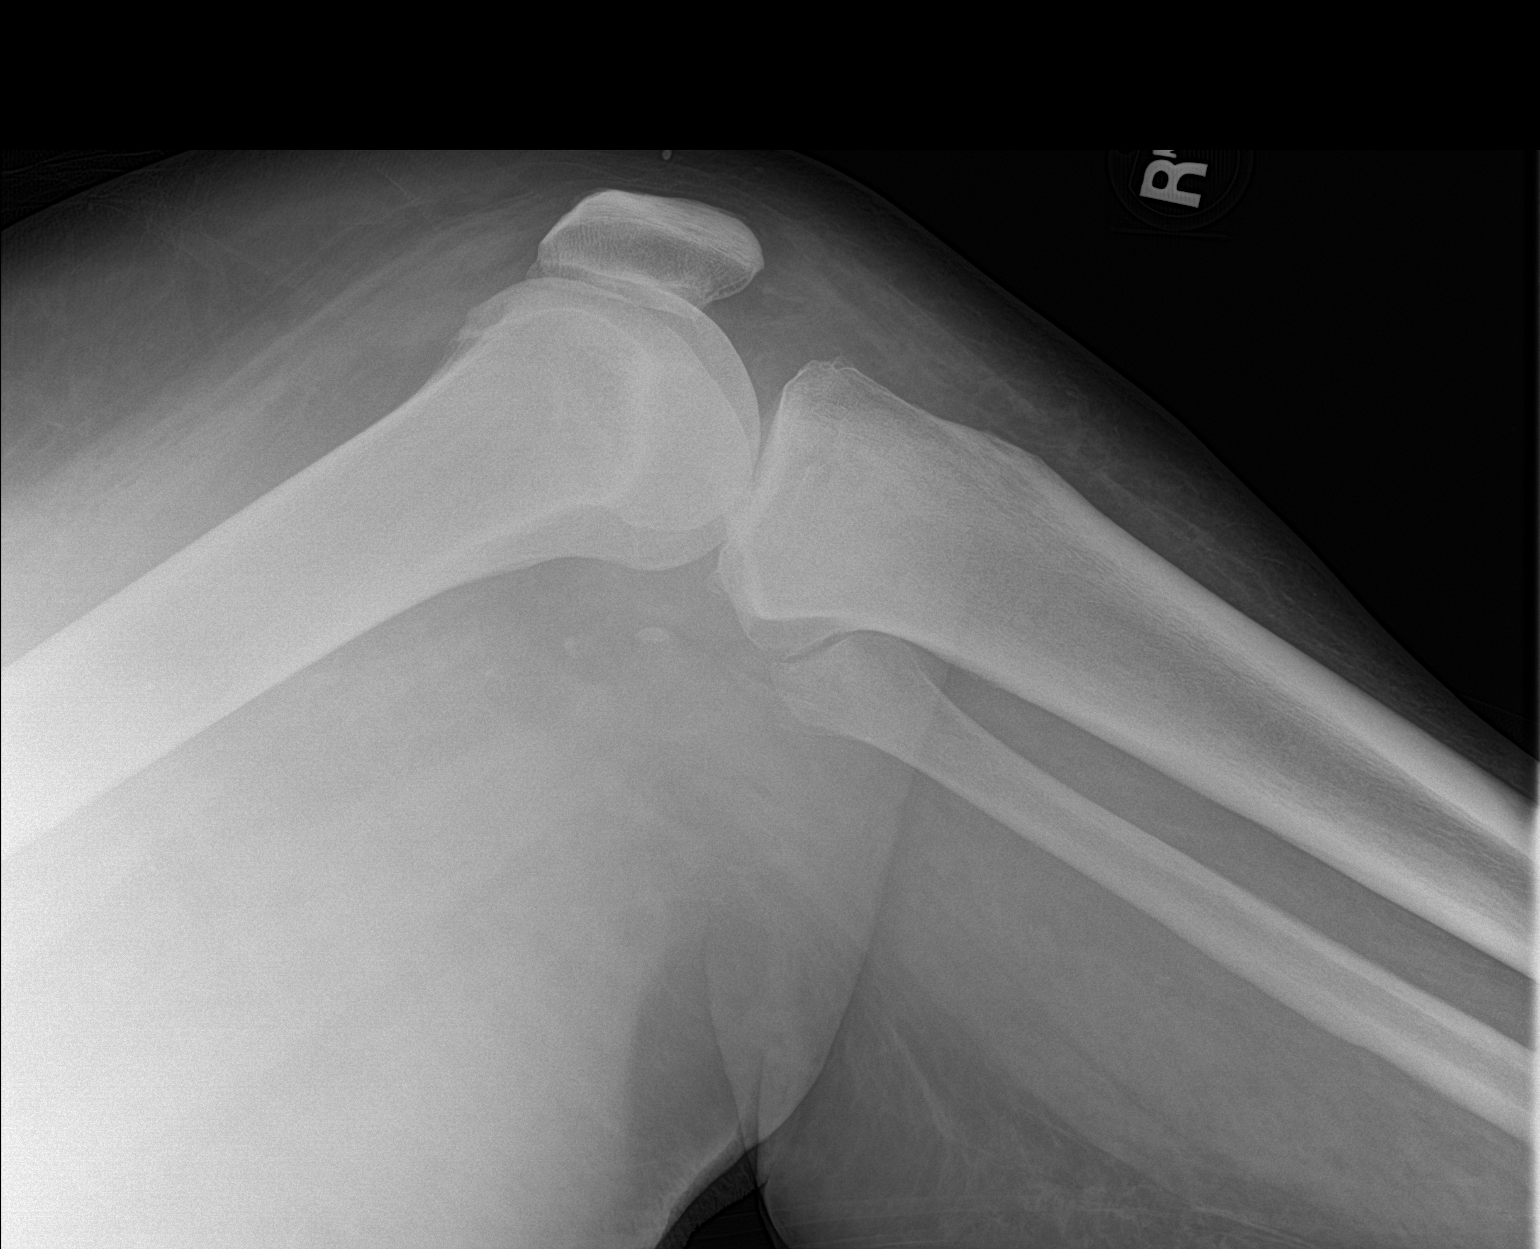

[2 of 2 positions shown; findings below may reference images not displayed]

FINDINGS: No fracture or malalignment. Mild patellofemoral and moderate medial
joint space degenerative change. Mild degenerative change of the
lateral joint space with spurring. Trace knee effusion. Generalized
subcutaneous edema.
IMPRESSION: Tricompartment arthritis, most advanced involving the medial
compartment. Trace knee effusion.

## 2022-05-26 ENCOUNTER — Emergency Department (HOSPITAL_COMMUNITY): Payer: Medicare Other

## 2022-05-26 ENCOUNTER — Encounter (HOSPITAL_COMMUNITY): Payer: Self-pay

## 2022-05-26 ENCOUNTER — Other Ambulatory Visit: Payer: Self-pay

## 2022-05-26 ENCOUNTER — Emergency Department (HOSPITAL_COMMUNITY)
Admission: EM | Admit: 2022-05-26 | Discharge: 2022-05-26 | Disposition: A | Discharge status: Home / Self Care | Payer: Medicare Other | Attending: Emergency Medicine | Admitting: Emergency Medicine

## 2022-05-26 DIAGNOSIS — Z79899 Other long term (current) drug therapy: Secondary | ICD-10-CM | POA: Diagnosis not present

## 2022-05-26 DIAGNOSIS — E1122 Type 2 diabetes mellitus with diabetic chronic kidney disease: Secondary | ICD-10-CM | POA: Diagnosis not present

## 2022-05-26 DIAGNOSIS — N19 Unspecified kidney failure: Secondary | ICD-10-CM | POA: Insufficient documentation

## 2022-05-26 DIAGNOSIS — R4182 Altered mental status, unspecified: Secondary | ICD-10-CM | POA: Diagnosis present

## 2022-05-26 DIAGNOSIS — Z794 Long term (current) use of insulin: Secondary | ICD-10-CM | POA: Diagnosis not present

## 2022-05-26 DIAGNOSIS — I5030 Unspecified diastolic (congestive) heart failure: Secondary | ICD-10-CM | POA: Diagnosis not present

## 2022-05-26 DIAGNOSIS — I2510 Atherosclerotic heart disease of native coronary artery without angina pectoris: Secondary | ICD-10-CM | POA: Diagnosis not present

## 2022-05-26 DIAGNOSIS — Z7982 Long term (current) use of aspirin: Secondary | ICD-10-CM | POA: Insufficient documentation

## 2022-05-26 DIAGNOSIS — N186 End stage renal disease: Secondary | ICD-10-CM | POA: Insufficient documentation

## 2022-05-26 DIAGNOSIS — E875 Hyperkalemia: Secondary | ICD-10-CM | POA: Diagnosis not present

## 2022-05-26 DIAGNOSIS — J45909 Unspecified asthma, uncomplicated: Secondary | ICD-10-CM | POA: Diagnosis not present

## 2022-05-26 DIAGNOSIS — Z7951 Long term (current) use of inhaled steroids: Secondary | ICD-10-CM | POA: Insufficient documentation

## 2022-05-26 DIAGNOSIS — I132 Hypertensive heart and chronic kidney disease with heart failure and with stage 5 chronic kidney disease, or end stage renal disease: Secondary | ICD-10-CM | POA: Insufficient documentation

## 2022-05-26 DIAGNOSIS — R1084 Generalized abdominal pain: Secondary | ICD-10-CM | POA: Insufficient documentation

## 2022-05-26 DIAGNOSIS — Z992 Dependence on renal dialysis: Secondary | ICD-10-CM | POA: Insufficient documentation

## 2022-05-26 LAB — I-STAT VENOUS BLOOD GAS, ED
Acid-base deficit: 3 mmol/L — ABNORMAL HIGH (ref 0.0–2.0)
Bicarbonate: 21.6 mmol/L (ref 20.0–28.0)
Calcium, Ion: 0.86 mmol/L — CL (ref 1.15–1.40)
HCT: 27 % — ABNORMAL LOW (ref 36.0–46.0)
Hemoglobin: 9.2 g/dL — ABNORMAL LOW (ref 12.0–15.0)
O2 Saturation: 86 %
Potassium: 5.3 mmol/L — ABNORMAL HIGH (ref 3.5–5.1)
Sodium: 134 mmol/L — ABNORMAL LOW (ref 135–145)
TCO2: 23 mmol/L (ref 22–32)
pCO2, Ven: 36.3 mmHg — ABNORMAL LOW (ref 44–60)
pH, Ven: 7.383 (ref 7.25–7.43)
pO2, Ven: 53 mmHg — ABNORMAL HIGH (ref 32–45)

## 2022-05-26 LAB — CBC WITH DIFFERENTIAL/PLATELET
Abs Immature Granulocytes: 0.03 10*3/uL (ref 0.00–0.07)
Basophils Absolute: 0.1 10*3/uL (ref 0.0–0.1)
Basophils Relative: 1 %
Eosinophils Absolute: 0.3 10*3/uL (ref 0.0–0.5)
Eosinophils Relative: 5 %
HCT: 27.7 % — ABNORMAL LOW (ref 36.0–46.0)
Hemoglobin: 8.3 g/dL — ABNORMAL LOW (ref 12.0–15.0)
Immature Granulocytes: 1 %
Lymphocytes Relative: 25 %
Lymphs Abs: 1.5 10*3/uL (ref 0.7–4.0)
MCH: 23.7 pg — ABNORMAL LOW (ref 26.0–34.0)
MCHC: 30 g/dL (ref 30.0–36.0)
MCV: 79.1 fL — ABNORMAL LOW (ref 80.0–100.0)
Monocytes Absolute: 0.6 10*3/uL (ref 0.1–1.0)
Monocytes Relative: 10 %
Neutro Abs: 3.4 10*3/uL (ref 1.7–7.7)
Neutrophils Relative %: 58 %
Platelets: 162 10*3/uL (ref 150–400)
RBC: 3.5 MIL/uL — ABNORMAL LOW (ref 3.87–5.11)
RDW: 20.6 % — ABNORMAL HIGH (ref 11.5–15.5)
WBC: 5.8 10*3/uL (ref 4.0–10.5)
nRBC: 0 % (ref 0.0–0.2)

## 2022-05-26 LAB — COMPREHENSIVE METABOLIC PANEL
ALT: 26 U/L (ref 0–44)
AST: 26 U/L (ref 15–41)
Albumin: 3.1 g/dL — ABNORMAL LOW (ref 3.5–5.0)
Alkaline Phosphatase: 150 U/L — ABNORMAL HIGH (ref 38–126)
Anion gap: 18 — ABNORMAL HIGH (ref 5–15)
BUN: 80 mg/dL — ABNORMAL HIGH (ref 8–23)
CO2: 21 mmol/L — ABNORMAL LOW (ref 22–32)
Calcium: 7.6 mg/dL — ABNORMAL LOW (ref 8.9–10.3)
Chloride: 98 mmol/L (ref 98–111)
Creatinine, Ser: 10.75 mg/dL — ABNORMAL HIGH (ref 0.44–1.00)
GFR, Estimated: 4 mL/min — ABNORMAL LOW (ref >60)
Glucose, Bld: 230 mg/dL — ABNORMAL HIGH (ref 70–99)
Potassium: 5.5 mmol/L — ABNORMAL HIGH (ref 3.5–5.1)
Sodium: 137 mmol/L (ref 135–145)
Total Bilirubin: 0.3 mg/dL (ref 0.3–1.2)
Total Protein: 7.1 g/dL (ref 6.5–8.1)

## 2022-05-26 LAB — AMMONIA: Ammonia: 22 umol/L (ref 9–35)

## 2022-05-26 LAB — LIPASE, BLOOD: Lipase: 46 U/L (ref 11–51)

## 2022-05-26 LAB — SALICYLATE LEVEL: Salicylate Lvl: <7 mg/dL — ABNORMAL LOW (ref 7.0–30.0)

## 2022-05-26 MED ORDER — NOVOLIN N FLEXPEN 100 UNIT/ML ~~LOC~~ SUPN: 100.0000 [IU] | PEN_INJECTOR | SUBCUTANEOUS | 0 refills | Status: DC

## 2022-05-26 MED ORDER — METHOCARBAMOL 500 MG PO TABS: 500.0000 mg | ORAL_TABLET | Freq: Two times a day (BID) | ORAL | 0 refills | Status: DC

## 2022-05-26 NOTE — ED Notes (Signed)
Pt given meal bag and drink

## 2022-05-26 NOTE — Progress Notes (Signed)
Contacted by ED provider with request that pt receive an extra HD treatment tomorrow at pt's out-pt HD clinic. Contacted pt's daughter via phone to inquire which clinic pt goes to and if daughter/family can transport pt to appt tomorrow. Pt receives care at [facility] on [address]. Contacted clinic and spoke to [place], Agricultural consultant. Clinic can treat pt tomorrow. Pt will need to arrive at 6:10 am for 6:30 am chair time. Spoke to pt's daughter again via phone to make her aware of appt time. Daughter agreeable to time and states that she will make arrangements for pt to have transportation to appt. Update provided to ED provider and added appt to AVS. Clinic aware pt will be at appt tomorrow.   Melven Sartorius Renal Navigator [phone]

## 2022-05-26 NOTE — ED Notes (Signed)
Pt more A&O at this time. A&O x4. Ambulatory in room with walker. Reports she hadn't had anything to eat in a while. Updated on plan of care for DC. Pt receptive to information provided.

## 2022-05-26 NOTE — ED Provider Notes (Signed)
[facility] EMERGENCY DEPARTMENT Provider Note   CSN: [id number] Arrival date & time: 05/26/22  1001     History  Chief Complaint  Patient presents with   Altered Mental Status    [name] is a 66 y.o. female.  Pt is a 66y/o female with hx of  ESRD on HD TTS, T2DM, HTN, CAD s/p stent x2, ,diastolic CHF (EF 60 to 67% 09/2021), asthma, hep C , history of colostomy status secondary to necrotizing fasciitis abdomen s/p takedown in May 2023 who is presenting today due to change in her mental status.  Patient reports that she ran out of her Novolin last week and attempted to call her endocrinologist who has since retired and they told her insurance would not cover her Novolin.  She reports is always covered it in the past and so over the weekend she was without insulin.  She did have an old flex pen she used 1 time because her sugar was 400.  Prior to this her blood sugars have been running in the low 100s and she has been doing very well to keep things controlled.  She normally stays with her daughter and grandchildren but reports she had gotten herself in apartment and this week and was the first time she stayed there.  She reports that while at the apartment she was not able to sleep well at night and felt like she could see herself outside of her body sitting on the edge of her bed and could not figure out what was happening.  She reports eating very little over the weekend while she was there and having some nausea occasionally but did denying any vomiting, abdominal pain, headaches.  She reports she smells this very unpleasant smell which seems to follow her around.  She is also felt very heavy.  Her sister picked her up yesterday and took her back to her daughter's house because she was going to go to dialysis today and reports in the middle of the night she woke her daughter up crying because she was scared.  Her daughter tried to tell her to get some rest and told her she  needed to eat more.  However when she was at dialysis today they noted she was not herself.  They reported that she was slower to respond and very tearful.  Patient reports she just does not feel herself.  She denies any new medications and does not use drugs or alcohol.  She did report also she fell yesterday and hit her head on the wall.  The history is provided by the patient and medical records.  Altered Mental Status      Home Medications Prior to Admission medications   Medication Sig Start Date End Date Taking? Authorizing Provider  methocarbamol (ROBAXIN) 500 MG tablet Take 1 tablet (500 mg total) by mouth 2 (two) times daily. 05/26/22  Yes Casyn Becvar, Loree Fee, MD  albuterol (VENTOLIN HFA) 108 (90 Base) MCG/ACT inhaler Inhale 2 puffs into the lungs every 4 (four) hours as needed for wheezing or shortness of breath. 06/28/20   [name], PA-C  allopurinol (ZYLOPRIM) 100 MG tablet TAKE 1 TABLET BY MOUTH ONCE DAILY. Patient not taking: Reported on 12/20/2021 07/16/21   Birdie Sons, MD  aspirin 81 MG EC tablet Take 1 tablet (81 mg total) by mouth daily. 09/20/20   Thurnell Lose, MD  atorvastatin (LIPITOR) 80 MG tablet Take 1 tablet (80 mg total) by mouth every evening. 04/24/21  Virginia Crews, MD  carvedilol (COREG) 12.5 MG tablet Take 1 tablet (12.5 mg total) by mouth 2 (two) times daily with a meal. 09/19/21   Paige, Weldon Picking, DO  Continuous Blood Gluc Receiver (FREESTYLE LIBRE 14 DAY READER) DEVI To check blood sugar ACHS 09/27/20   [name], PA-C  Continuous Blood Gluc Sensor (FREESTYLE LIBRE 14 DAY SENSOR) MISC To check blood sugar ACHS; change every 14 days 09/27/20   [name], PA-C  fluticasone [facility]) 50 MCG/ACT nasal spray Place 2 sprays into both nostrils daily as needed for allergies or rhinitis. 09/28/17   [name], PA-C  fluticasone furoate-vilanterol (BREO ELLIPTA) 200-25 MCG/INH AEPB Inhale 1 puff into the lungs  daily. Patient not taking: Reported on 04/15/2022 08/30/20   [name], PA-C  Glucose Blood (BLOOD GLUCOSE TEST STRIPS 333) STRP Enough test stripes to test blood sugar TID x 30 days. No refills 04/16/22   Wyvonnia Dusky, MD  hydrOXYzine (ATARAX/VISTARIL) 25 MG tablet Take 1 tablet (25 mg total) by mouth 4 (four) times daily. Patient taking differently: Take 25 mg by mouth 3 (three) times daily as needed. 12/03/20   Bacigalupo, Dionne Bucy, MD  Insulin NPH, Human,, Isophane, (NOVOLIN N FLEXPEN) 100 UNIT/ML Kiwkpen Inject 100 Units into the skin every morning. 05/26/22 06/25/22  Blanchie Dessert, MD  Insulin Pen Needle 32G X 4 MM MISC Enough pen needles for 100 units of novolin QAM x 30 days. No refills 04/16/22   Wyvonnia Dusky, MD  midodrine (PROAMATINE) 10 MG tablet Take 10 mg by mouth daily. 12/23/21   [provider]  naloxone [facility]) nasal spray 4 mg/0.1 mL Place 1 spray into the nose as directed. 04/08/22   [provider]  nitroGLYCERIN (NITROSTAT) 0.4 MG SL tablet Place 1 tablet (0.4 mg total) under the tongue every 5 (five) minutes x 3 doses as needed for chest pain. 05/03/20   [name], PA-C  Oxycodone HCl 10 MG TABS Take 10 mg by mouth every 6 (six) hours. 04/01/22   [provider]  pregabalin (LYRICA) 25 MG capsule Take 25 mg by mouth 2 (two) times daily. 12/08/21   [provider]  valACYclovir (VALTREX) 500 MG tablet TAKE (1) TABLET BY MOUTH EVERY OTHER DAY. Patient taking differently: Take 500 mg by mouth every other day. 01/24/21   Bacigalupo, Dionne Bucy, MD  VELPHORO 500 MG chewable tablet Chew 1,500 mg by mouth 3 (three) times daily. 09/11/21   [provider]  gabapentin (NEURONTIN) 100 MG capsule Take 1 capsule (100 mg total) by mouth 3 (three) times daily. 08/19/17 02/03/19  [name], MD      Allergies    Morphine, Shellfish allergy, and Diazepam    Review of Systems   Review of Systems  Physical  Exam Updated Vital Signs BP (!) 140/70   Pulse 68   Temp 97.6 F (36.4 C) (Oral)   Resp 16   Ht '5\' 8"'$  (1.727 m)   Wt 114.3 kg   SpO2 100%   BMI 38.32 kg/m  Physical Exam Vitals and nursing note reviewed.  Constitutional:      General: She is not in acute distress.    Appearance: She is well-developed.  HENT:     Head: Normocephalic and atraumatic.  Eyes:     Conjunctiva/sclera: Conjunctivae normal.     Pupils: Pupils are equal, round, and reactive to light.  Cardiovascular:     Rate and Rhythm: Normal rate and  regular rhythm.     Heart sounds: No murmur heard. Pulmonary:     Effort: Pulmonary effort is normal. No respiratory distress.     Breath sounds: Normal breath sounds. No wheezing or rales.  Abdominal:     General: There is no distension.     Palpations: Abdomen is soft.     Tenderness: There is no abdominal tenderness. There is no guarding or rebound.  Genitourinary:    Comments: Small non-tender hemorrhoid that is not bleeding Musculoskeletal:        General: No tenderness. Normal range of motion.     Cervical back: Normal range of motion and neck supple.  Skin:    General: Skin is warm and dry.     Findings: No erythema or rash.     Comments: Skin changes consistent with ESRD with multiple prior wounds that have scarred over  Neurological:     Mental Status: She is alert and oriented to person, place, and time.     Comments: At times patient pauses to answer questions but is otherwise appropriate.  No pronator drift, good eye contact, moving all extremities without difficulty.  Oriented to person place and time.  Psychiatric:        Behavior: Behavior normal.     Comments: No hallucinating.  appropriate     ED Results / Procedures / Treatments   Labs (all labs ordered are listed, but only abnormal results are displayed) Labs Reviewed  CBC WITH DIFFERENTIAL/PLATELET - Abnormal; Notable for the following components:      Result Value   RBC 3.50 (*)     Hemoglobin 8.3 (*)    HCT 27.7 (*)    MCV 79.1 (*)    MCH 23.7 (*)    RDW 20.6 (*)    All other components within normal limits  COMPREHENSIVE METABOLIC PANEL - Abnormal; Notable for the following components:   Potassium 5.5 (*)    CO2 21 (*)    Glucose, Bld 230 (*)    BUN 80 (*)    Creatinine, Ser 10.75 (*)    Calcium 7.6 (*)    Albumin 3.1 (*)    Alkaline Phosphatase 150 (*)    GFR, Estimated 4 (*)    Anion gap 18 (*)    All other components within normal limits  SALICYLATE LEVEL - Abnormal; Notable for the following components:   Salicylate Lvl <6.1 (*)    All other components within normal limits  I-STAT VENOUS BLOOD GAS, ED - Abnormal; Notable for the following components:   pCO2, Ven 36.3 (*)    pO2, Ven 53 (*)    Acid-base deficit 3.0 (*)    Sodium 134 (*)    Potassium 5.3 (*)    Calcium, Ion 0.86 (*)    HCT 27.0 (*)    Hemoglobin 9.2 (*)    All other components within normal limits  AMMONIA  LIPASE, BLOOD  URINALYSIS, ROUTINE W REFLEX MICROSCOPIC    EKG EKG Interpretation  Date/Time:  Tuesday May 26 2022 10:01:57 EST Ventricular Rate:  73 PR Interval:  146 QRS Duration: 96 QT Interval:  448 QTC Calculation: 494 R Axis:   49 Text Interpretation: Sinus rhythm Atrial premature complex Borderline prolonged QT interval No significant change since last tracing Confirmed by Blanchie Dessert [phone]) on 05/26/2022 11:03:03 AM  Radiology CT ABDOMEN PELVIS WO CONTRAST  Result Date: 05/26/2022 CLINICAL DATA:  Acute generalized abdominal pain. EXAM: CT ABDOMEN AND PELVIS WITHOUT CONTRAST TECHNIQUE: Multidetector CT imaging  of the abdomen and pelvis was performed following the standard protocol without IV contrast. RADIATION DOSE REDUCTION: This exam was performed according to the departmental dose-optimization program which includes automated exposure control, adjustment of the mA and/or kV according to patient size and/or use of iterative reconstruction  technique. COMPARISON:  April 20, 2021. FINDINGS: Lower chest: Mild bibasilar subsegmental atelectasis is noted. Hepatobiliary: Status post cholecystectomy. No biliary dilatation is noted. Nodular hepatic contours are noted suggesting hepatic cirrhosis. Pancreas: Unremarkable. No pancreatic ductal dilatation or surrounding inflammatory changes. Spleen: Normal in size without focal abnormality. Adrenals/Urinary Tract: Adrenal glands are unremarkable. Kidneys are normal, without renal calculi, focal lesion, or hydronephrosis. Bladder is unremarkable. Stomach/Bowel: Stomach is within normal limits. Appendix appears normal. No evidence of bowel wall thickening, distention, or inflammatory changes. Colostomy noted on prior exam has been reduced. Postsurgical changes are seen involving the transverse colon. Vascular/Lymphatic: Aortic atherosclerosis. No enlarged abdominal or pelvic lymph nodes. Reproductive: Uterus and bilateral adnexa are unremarkable. Other: No abdominal wall hernia or abnormality. No abdominopelvic ascites. Musculoskeletal: No acute or significant osseous findings. IMPRESSION: Hepatic cirrhosis. No acute abnormality seen in the abdomen or pelvis. Aortic Atherosclerosis (ICD10-I70.0). Electronically Signed   By: Marijo Conception M.D.   On: 05/26/2022 13:20   CT Head Wo Contrast  Result Date: 05/26/2022 CLINICAL DATA:  Mental status change of unknown cause EXAM: CT HEAD WITHOUT CONTRAST TECHNIQUE: Contiguous axial images were obtained from the base of the skull through the vertex without intravenous contrast. RADIATION DOSE REDUCTION: This exam was performed according to the departmental dose-optimization program which includes automated exposure control, adjustment of the mA and/or kV according to patient size and/or use of iterative reconstruction technique. COMPARISON:  04/20/2021 FINDINGS: Brain: Generalized volume loss, possibly with mild frontal predominance. No sign of old or acute focal  infarction, mass lesion, hemorrhage, hydrocephalus or extra-axial collection. Vascular: There is atherosclerotic calcification of the major vessels at the base of the brain. Skull: Negative Sinuses/Orbits: Clear/normal Other: None IMPRESSION: 1. No acute or reversible finding. Generalized volume loss, possibly with mild frontal predominance. 2. Atherosclerotic calcification of the major vessels at the base of the brain. Electronically Signed   By: Nelson Chimes M.D.   On: 05/26/2022 11:42   DG Chest Port 1 View  Result Date: 05/26/2022 CLINICAL DATA:  Altered mental status EXAM: PORTABLE CHEST 1 VIEW COMPARISON:  04/14/2022 FINDINGS: The heart size and mediastinal contours are within normal limits. Chronic linear scarring in the right lung base. No new airspace consolidation. No pleural effusion or pneumothorax. The visualized skeletal structures are unremarkable. IMPRESSION: No active disease. Electronically Signed   By: Davina Poke D.O.   On: 05/26/2022 11:00    Procedures Procedures    Medications Ordered in ED Medications - No data to display  ED Course/ Medical Decision Making/ A&P                           Medical Decision Making Amount and/or Complexity of Data Reviewed Independent Historian: caregiver External Data Reviewed: notes. Labs: ordered. Decision-making details documented in ED Course. Radiology: ordered and independent interpretation performed. Decision-making details documented in ED Course. ECG/medicine tests: ordered and independent interpretation performed. Decision-making details documented in ED Course.  Risk Prescription drug management.   Pt with multiple medical problems and comorbidities and presenting today with a complaint that caries a high risk for morbidity and mortality.  Here today due to change in mental status.  Patient  reports she is smelling things and does not feel herself.  At dialysis they also noted she was slower to respond than her usual.   Patient does not use drugs or alcohol.  She does report that she had run out of her insulin and they told her her insurance would not cover a new one so she has been without insulin for 5 days now and her sugar has been running higher.  She is also not been eating much and has had some nausea but denies any fever or vomiting.  She denies any headaches.  She was at dialysis today and received 2 hours but quit an hour early.  Patient does not appear fluid overloaded at this time.  She is not short of breath has no abdominal pain.  Low suspicion for bowel obstruction.  There was report that she possibly fell yesterday and as well as hit her head.  She does not take any anticoagulation but given her change in mental status we will CT her head to ensure no intracranial hemorrhage.  Also concern for possible metabolic cause for her symptoms.  She denies any infectious symptoms and is afebrile here and reassuring vital signs.  She does have a prior history of hepatitis C but has no history of cirrhosis or hepatic encephalopathy in the past.  She is not displaying any asterixis. 1:44 PM I independently interpreted patient's labs and EKG.  EKG without acute findings, VBG without evidence of metabolic derangement today with normal pH and bicarb, CBC with hemoglobin of 8.3 from her baseline of 9-10 most likely anemia related to her end-stage renal disease, CMP today with blood sugar of 230, BUN of 80, minimal hyperkalemia 5.5 and anion gap of 18 but this is only with 2 hours of dialysis after going her normal time over the weekend, ammonia and salicylates are normal, lipase within normal limits.  I have independently visualized and interpreted pt's images today. Chest x-Powe without acute findings today and head CT without evidence of bleed or acute abnormalities.  1:44 PM Spoke with the patient's daughter who reports that her story is accurate.  She did report that she missed dialysis on Saturday because she was going to  her new apartment.  She however was concerned because when she was found to have necrotizing fasciitis of her bowel she reports that she acted similar.  She has had no focal symptoms per her daughter.  Low suspicion for stroke at this time.  Her daughter was also unaware that she had not been able to get her insulin.  Her mental status is most likely a result of her uremia and being out of her insulin.  We will do a scan to ensure no acute bowel issues given her prior story.  Discussed this with the daughter, consulted case manager to ensure patient's insulin would be covered.  1:44 PM Patient CT of her abdomen and pelvis shows no acute process.  Patient is still at her baseline.  She is tired but wakes up easily.  Spoke with the renal navigator and patient was able to get an extra course of dialysis tomorrow morning at 610.  Patient's daughter reports that she can have her there for the extra treatment.  A new prescription for her Novolin was sent to her pharmacy.  Patient was complaining of pain in her neck and feeling like her head is always hanging down.  She is complaining of muscle spasm and discomfort which is not uncommon for her.  She  is requesting a muscle relaxer.  She said in the past she is always tolerated them well.  Discussed this with her daughter as well who reports that she has done well with them in the past.  Did discuss with her daughter return precautions.  At this time patient does not meet criteria for admission.  Will discharge home with her daughter.  She was given return precautions.        Final Clinical Impression(s) / ED Diagnoses Final diagnoses:  Uremia    Rx / DC Orders ED Discharge Orders          Ordered    Insulin NPH, Human,, Isophane, (NOVOLIN N FLEXPEN) 100 UNIT/ML Kiwkpen  BH-each morning        05/26/22 1341    methocarbamol (ROBAXIN) 500 MG tablet  2 times daily        05/26/22 1341              Blanchie Dessert, MD 05/26/22 1344

## 2022-05-26 NOTE — ED Notes (Signed)
Pt reports that some person is trying to take over her body. The person going in and out her body is where the smell is coming from. Pt denies drug use and alcohol use. Pt says she fell yesterday and hit her head on the wall.

## 2022-05-26 NOTE — ED Triage Notes (Signed)
Pt BIBA from Dialysis for AMS, slow responses, and tearing up. Pt reports an out of body experience with EMS and a lingering smell. PT unable to complete dialysis.Had 1 hr left. Dialysis Tue/Thurs/Sat.

## 2022-05-26 NOTE — Discharge Instructions (Signed)
All the trays look normal today.  Your blood work does show that you still have some toxins build up from missing dialysis on Saturday and not getting a full course today.  They will see you tomorrow at dialysis for an extra treatment which should help with your symptoms.  Also your hemoglobin today was 8 and they need to keep watching that at dialysis.  A new prescription for your insulin was sent to your pharmacy as well as a muscle relaxer.  If you start developing fever, worsening confusion, falls, abdominal pain or vomiting return to the emergency room.

## 2022-05-26 NOTE — ED Notes (Signed)
Got patient on the monitor into a gown did EKG patient is resting with call bell in reach

## 2022-05-27 ENCOUNTER — Ambulatory Visit: Payer: Medicare Other | Attending: Cardiovascular Disease | Admitting: Cardiovascular Disease

## 2022-06-18 ENCOUNTER — Encounter (HOSPITAL_BASED_OUTPATIENT_CLINIC_OR_DEPARTMENT_OTHER): Payer: Medicare Other | Admitting: General Surgery

## 2022-07-22 ENCOUNTER — Encounter: Payer: Self-pay | Admitting: *Deleted

## 2022-07-22 ENCOUNTER — Emergency Department: Payer: 59

## 2022-07-22 ENCOUNTER — Other Ambulatory Visit: Payer: Self-pay

## 2022-07-22 DIAGNOSIS — E1122 Type 2 diabetes mellitus with diabetic chronic kidney disease: Secondary | ICD-10-CM | POA: Diagnosis present

## 2022-07-22 DIAGNOSIS — B1920 Unspecified viral hepatitis C without hepatic coma: Secondary | ICD-10-CM | POA: Diagnosis present

## 2022-07-22 DIAGNOSIS — N186 End stage renal disease: Secondary | ICD-10-CM | POA: Diagnosis present

## 2022-07-22 DIAGNOSIS — D631 Anemia in chronic kidney disease: Secondary | ICD-10-CM | POA: Diagnosis present

## 2022-07-22 DIAGNOSIS — I252 Old myocardial infarction: Secondary | ICD-10-CM

## 2022-07-22 DIAGNOSIS — E1151 Type 2 diabetes mellitus with diabetic peripheral angiopathy without gangrene: Secondary | ICD-10-CM | POA: Diagnosis present

## 2022-07-22 DIAGNOSIS — I132 Hypertensive heart and chronic kidney disease with heart failure and with stage 5 chronic kidney disease, or end stage renal disease: Secondary | ICD-10-CM | POA: Diagnosis present

## 2022-07-22 DIAGNOSIS — S90822A Blister (nonthermal), left foot, initial encounter: Secondary | ICD-10-CM | POA: Diagnosis present

## 2022-07-22 DIAGNOSIS — I2510 Atherosclerotic heart disease of native coronary artery without angina pectoris: Secondary | ICD-10-CM | POA: Diagnosis present

## 2022-07-22 DIAGNOSIS — L03116 Cellulitis of left lower limb: Principal | ICD-10-CM | POA: Diagnosis present

## 2022-07-22 DIAGNOSIS — Z992 Dependence on renal dialysis: Secondary | ICD-10-CM

## 2022-07-22 DIAGNOSIS — Z888 Allergy status to other drugs, medicaments and biological substances status: Secondary | ICD-10-CM

## 2022-07-22 DIAGNOSIS — F32A Depression, unspecified: Secondary | ICD-10-CM | POA: Diagnosis present

## 2022-07-22 DIAGNOSIS — Z794 Long term (current) use of insulin: Secondary | ICD-10-CM

## 2022-07-22 DIAGNOSIS — Z79891 Long term (current) use of opiate analgesic: Secondary | ICD-10-CM

## 2022-07-22 DIAGNOSIS — M109 Gout, unspecified: Secondary | ICD-10-CM | POA: Diagnosis present

## 2022-07-22 DIAGNOSIS — Z833 Family history of diabetes mellitus: Secondary | ICD-10-CM

## 2022-07-22 DIAGNOSIS — I350 Nonrheumatic aortic (valve) stenosis: Secondary | ICD-10-CM | POA: Diagnosis present

## 2022-07-22 DIAGNOSIS — I5042 Chronic combined systolic (congestive) and diastolic (congestive) heart failure: Secondary | ICD-10-CM | POA: Diagnosis present

## 2022-07-22 DIAGNOSIS — N2581 Secondary hyperparathyroidism of renal origin: Secondary | ICD-10-CM | POA: Diagnosis present

## 2022-07-22 DIAGNOSIS — Z885 Allergy status to narcotic agent status: Secondary | ICD-10-CM

## 2022-07-22 DIAGNOSIS — Z7982 Long term (current) use of aspirin: Secondary | ICD-10-CM

## 2022-07-22 DIAGNOSIS — Z87891 Personal history of nicotine dependence: Secondary | ICD-10-CM

## 2022-07-22 DIAGNOSIS — E1142 Type 2 diabetes mellitus with diabetic polyneuropathy: Secondary | ICD-10-CM | POA: Diagnosis present

## 2022-07-22 DIAGNOSIS — Z7951 Long term (current) use of inhaled steroids: Secondary | ICD-10-CM

## 2022-07-22 DIAGNOSIS — K640 First degree hemorrhoids: Secondary | ICD-10-CM | POA: Diagnosis present

## 2022-07-22 DIAGNOSIS — K7460 Unspecified cirrhosis of liver: Secondary | ICD-10-CM | POA: Diagnosis present

## 2022-07-22 DIAGNOSIS — K921 Melena: Secondary | ICD-10-CM | POA: Diagnosis not present

## 2022-07-22 DIAGNOSIS — K219 Gastro-esophageal reflux disease without esophagitis: Secondary | ICD-10-CM | POA: Diagnosis present

## 2022-07-22 DIAGNOSIS — Z8249 Family history of ischemic heart disease and other diseases of the circulatory system: Secondary | ICD-10-CM

## 2022-07-22 DIAGNOSIS — E785 Hyperlipidemia, unspecified: Secondary | ICD-10-CM | POA: Diagnosis present

## 2022-07-22 DIAGNOSIS — Z79899 Other long term (current) drug therapy: Secondary | ICD-10-CM

## 2022-07-22 DIAGNOSIS — Z91158 Patient's noncompliance with renal dialysis for other reason: Secondary | ICD-10-CM

## 2022-07-22 DIAGNOSIS — M25551 Pain in right hip: Secondary | ICD-10-CM | POA: Diagnosis present

## 2022-07-22 DIAGNOSIS — Z955 Presence of coronary angioplasty implant and graft: Secondary | ICD-10-CM

## 2022-07-22 DIAGNOSIS — Z6841 Body Mass Index (BMI) 40.0 and over, adult: Secondary | ICD-10-CM

## 2022-07-22 DIAGNOSIS — L89622 Pressure ulcer of left heel, stage 2: Secondary | ICD-10-CM | POA: Diagnosis present

## 2022-07-22 DIAGNOSIS — D500 Iron deficiency anemia secondary to blood loss (chronic): Secondary | ICD-10-CM | POA: Diagnosis present

## 2022-07-22 DIAGNOSIS — Z91013 Allergy to seafood: Secondary | ICD-10-CM

## 2022-07-22 LAB — CBC
HCT: 26.8 % — ABNORMAL LOW (ref 36.0–46.0)
Hemoglobin: 7.9 g/dL — ABNORMAL LOW (ref 12.0–15.0)
MCH: 23.8 pg — ABNORMAL LOW (ref 26.0–34.0)
MCHC: 29.5 g/dL — ABNORMAL LOW (ref 30.0–36.0)
MCV: 80.7 fL (ref 80.0–100.0)
Platelets: 188 10*3/uL (ref 150–400)
RBC: 3.32 MIL/uL — ABNORMAL LOW (ref 3.87–5.11)
RDW: 24.3 % — ABNORMAL HIGH (ref 11.5–15.5)
WBC: 5.2 10*3/uL (ref 4.0–10.5)
nRBC: 0 % (ref 0.0–0.2)

## 2022-07-22 LAB — BASIC METABOLIC PANEL
Anion gap: 16 — ABNORMAL HIGH (ref 5–15)
BUN: 84 mg/dL — ABNORMAL HIGH (ref 8–23)
CO2: 26 mmol/L (ref 22–32)
Calcium: 8.4 mg/dL — ABNORMAL LOW (ref 8.9–10.3)
Chloride: 92 mmol/L — ABNORMAL LOW (ref 98–111)
Creatinine, Ser: 12.47 mg/dL — ABNORMAL HIGH (ref 0.44–1.00)
GFR, Estimated: 3 mL/min — ABNORMAL LOW (ref >60)
Glucose, Bld: 292 mg/dL — ABNORMAL HIGH (ref 70–99)
Potassium: 5.3 mmol/L — ABNORMAL HIGH (ref 3.5–5.1)
Sodium: 134 mmol/L — ABNORMAL LOW (ref 135–145)

## 2022-07-22 NOTE — ED Triage Notes (Addendum)
Pt has pain in left foot.  Pt is diabetic.  Pt states she had a blister drained last night on her heel.  No known injury to foot.   Dialysis pt.  Last dialysis was Saturday.   Pt miss yesterday due to pain in foot.    Pt alert

## 2022-07-23 ENCOUNTER — Inpatient Hospital Stay
Admission: EM | Admit: 2022-07-23 | Discharge: 2022-07-26 | DRG: 602 | Disposition: A | Discharge status: Home Health | Payer: 59 | Attending: Internal Medicine | Admitting: Internal Medicine

## 2022-07-23 ENCOUNTER — Emergency Department: Payer: 59

## 2022-07-23 ENCOUNTER — Encounter: Payer: Self-pay | Admitting: Family Medicine

## 2022-07-23 DIAGNOSIS — L03116 Cellulitis of left lower limb: Principal | ICD-10-CM

## 2022-07-23 DIAGNOSIS — K921 Melena: Secondary | ICD-10-CM | POA: Diagnosis not present

## 2022-07-23 DIAGNOSIS — I10 Essential (primary) hypertension: Secondary | ICD-10-CM | POA: Diagnosis not present

## 2022-07-23 DIAGNOSIS — M10371 Gout due to renal impairment, right ankle and foot: Secondary | ICD-10-CM | POA: Diagnosis not present

## 2022-07-23 DIAGNOSIS — Z992 Dependence on renal dialysis: Secondary | ICD-10-CM | POA: Diagnosis not present

## 2022-07-23 DIAGNOSIS — I132 Hypertensive heart and chronic kidney disease with heart failure and with stage 5 chronic kidney disease, or end stage renal disease: Secondary | ICD-10-CM | POA: Diagnosis present

## 2022-07-23 DIAGNOSIS — Z794 Long term (current) use of insulin: Secondary | ICD-10-CM

## 2022-07-23 DIAGNOSIS — B1920 Unspecified viral hepatitis C without hepatic coma: Secondary | ICD-10-CM | POA: Diagnosis present

## 2022-07-23 DIAGNOSIS — E1122 Type 2 diabetes mellitus with diabetic chronic kidney disease: Secondary | ICD-10-CM | POA: Diagnosis present

## 2022-07-23 DIAGNOSIS — F32A Depression, unspecified: Secondary | ICD-10-CM | POA: Diagnosis present

## 2022-07-23 DIAGNOSIS — G629 Polyneuropathy, unspecified: Secondary | ICD-10-CM | POA: Diagnosis not present

## 2022-07-23 DIAGNOSIS — K7460 Unspecified cirrhosis of liver: Secondary | ICD-10-CM | POA: Diagnosis present

## 2022-07-23 DIAGNOSIS — I5042 Chronic combined systolic (congestive) and diastolic (congestive) heart failure: Secondary | ICD-10-CM | POA: Diagnosis present

## 2022-07-23 DIAGNOSIS — L89622 Pressure ulcer of left heel, stage 2: Secondary | ICD-10-CM | POA: Diagnosis present

## 2022-07-23 DIAGNOSIS — E1151 Type 2 diabetes mellitus with diabetic peripheral angiopathy without gangrene: Secondary | ICD-10-CM | POA: Diagnosis present

## 2022-07-23 DIAGNOSIS — N2581 Secondary hyperparathyroidism of renal origin: Secondary | ICD-10-CM | POA: Diagnosis present

## 2022-07-23 DIAGNOSIS — Z6841 Body Mass Index (BMI) 40.0 and over, adult: Secondary | ICD-10-CM | POA: Diagnosis not present

## 2022-07-23 DIAGNOSIS — I350 Nonrheumatic aortic (valve) stenosis: Secondary | ICD-10-CM | POA: Diagnosis present

## 2022-07-23 DIAGNOSIS — N186 End stage renal disease: Secondary | ICD-10-CM | POA: Diagnosis present

## 2022-07-23 DIAGNOSIS — D631 Anemia in chronic kidney disease: Secondary | ICD-10-CM | POA: Diagnosis present

## 2022-07-23 DIAGNOSIS — E1142 Type 2 diabetes mellitus with diabetic polyneuropathy: Secondary | ICD-10-CM | POA: Diagnosis present

## 2022-07-23 DIAGNOSIS — I2510 Atherosclerotic heart disease of native coronary artery without angina pectoris: Secondary | ICD-10-CM | POA: Diagnosis present

## 2022-07-23 DIAGNOSIS — M109 Gout, unspecified: Secondary | ICD-10-CM | POA: Diagnosis present

## 2022-07-23 DIAGNOSIS — E785 Hyperlipidemia, unspecified: Secondary | ICD-10-CM | POA: Diagnosis present

## 2022-07-23 DIAGNOSIS — D500 Iron deficiency anemia secondary to blood loss (chronic): Secondary | ICD-10-CM | POA: Diagnosis present

## 2022-07-23 DIAGNOSIS — M25551 Pain in right hip: Secondary | ICD-10-CM | POA: Diagnosis present

## 2022-07-23 DIAGNOSIS — K219 Gastro-esophageal reflux disease without esophagitis: Secondary | ICD-10-CM | POA: Insufficient documentation

## 2022-07-23 LAB — GLUCOSE, CAPILLARY: Glucose-Capillary: 245 mg/dL — ABNORMAL HIGH (ref 70–99)

## 2022-07-23 LAB — IRON AND TIBC
Iron: 124 ug/dL (ref 28–170)
Saturation Ratios: 36 % — ABNORMAL HIGH (ref 10.4–31.8)
TIBC: 342 ug/dL (ref 250–450)
UIBC: 218 ug/dL

## 2022-07-23 LAB — PREPARE RBC (CROSSMATCH)

## 2022-07-23 LAB — HEMOGLOBIN AND HEMATOCRIT, BLOOD
HCT: 29.7 % — ABNORMAL LOW (ref 36.0–46.0)
Hemoglobin: 9.4 g/dL — ABNORMAL LOW (ref 12.0–15.0)

## 2022-07-23 LAB — BASIC METABOLIC PANEL
Anion gap: 17 — ABNORMAL HIGH (ref 5–15)
BUN: 84 mg/dL — ABNORMAL HIGH (ref 8–23)
CO2: 21 mmol/L — ABNORMAL LOW (ref 22–32)
Calcium: 8.2 mg/dL — ABNORMAL LOW (ref 8.9–10.3)
Chloride: 94 mmol/L — ABNORMAL LOW (ref 98–111)
Creatinine, Ser: 12.64 mg/dL — ABNORMAL HIGH (ref 0.44–1.00)
GFR, Estimated: 3 mL/min — ABNORMAL LOW (ref >60)
Glucose, Bld: 149 mg/dL — ABNORMAL HIGH (ref 70–99)
Potassium: 5 mmol/L (ref 3.5–5.1)
Sodium: 132 mmol/L — ABNORMAL LOW (ref 135–145)

## 2022-07-23 LAB — URIC ACID: Uric Acid, Serum: 9.9 mg/dL — ABNORMAL HIGH (ref 2.5–7.1)

## 2022-07-23 LAB — CBC
HCT: 23.6 % — ABNORMAL LOW (ref 36.0–46.0)
Hemoglobin: 7 g/dL — ABNORMAL LOW (ref 12.0–15.0)
MCH: 24.1 pg — ABNORMAL LOW (ref 26.0–34.0)
MCHC: 29.7 g/dL — ABNORMAL LOW (ref 30.0–36.0)
MCV: 81.4 fL (ref 80.0–100.0)
Platelets: 184 10*3/uL (ref 150–400)
RBC: 2.9 MIL/uL — ABNORMAL LOW (ref 3.87–5.11)
RDW: 24.5 % — ABNORMAL HIGH (ref 11.5–15.5)
WBC: 4.8 10*3/uL (ref 4.0–10.5)
nRBC: 0 % (ref 0.0–0.2)

## 2022-07-23 LAB — HEMOGLOBIN A1C
Hgb A1c MFr Bld: 8.8 % — ABNORMAL HIGH (ref 4.8–5.6)
Mean Plasma Glucose: 205.86 mg/dL

## 2022-07-23 LAB — MRSA NEXT GEN BY PCR, NASAL: MRSA by PCR Next Gen: NOT DETECTED

## 2022-07-23 LAB — PROCALCITONIN: Procalcitonin: 0.5 ng/mL

## 2022-07-23 LAB — CBG MONITORING, ED
Glucose-Capillary: 129 mg/dL — ABNORMAL HIGH (ref 70–99)
Glucose-Capillary: 182 mg/dL — ABNORMAL HIGH (ref 70–99)

## 2022-07-23 LAB — VITAMIN B12: Vitamin B-12: 373 pg/mL (ref 180–914)

## 2022-07-23 LAB — FOLATE: Folate: 7.3 ng/mL (ref >5.9)

## 2022-07-23 LAB — LACTIC ACID, PLASMA: Lactic Acid, Venous: 1.4 mmol/L (ref 0.5–1.9)

## 2022-07-23 MED ORDER — LIDOCAINE HCL (PF) 1 % IJ SOLN: 5.0000 mL | INTRAMUSCULAR | Status: DC | PRN

## 2022-07-23 MED ORDER — HEPARIN SODIUM (PORCINE) 1000 UNIT/ML DIALYSIS: 1000.0000 [IU] | INTRAMUSCULAR | Status: DC | PRN

## 2022-07-23 MED ORDER — CHLORHEXIDINE GLUCONATE CLOTH 2 % EX PADS
6.0000 | MEDICATED_PAD | Freq: Every day | CUTANEOUS | Status: DC
  Administered 2022-07-24 – 2022-07-25 (×2): 6 via TOPICAL

## 2022-07-23 MED ORDER — VANCOMYCIN HCL IN DEXTROSE 1-5 GM/200ML-% IV SOLN: 1000.0000 mg | INTRAVENOUS | Status: DC

## 2022-07-23 MED ORDER — VANCOMYCIN HCL 500 MG/100ML IV SOLN
500.0000 mg | Freq: Once | INTRAVENOUS | Status: AC
  Administered 2022-07-23: 500 mg via INTRAVENOUS
  Filled 2022-07-23: qty 100

## 2022-07-23 MED ORDER — TRAZODONE HCL 50 MG PO TABS: 25.0000 mg | ORAL_TABLET | Freq: Every evening | ORAL | Status: DC | PRN

## 2022-07-23 MED ORDER — ATORVASTATIN CALCIUM 20 MG PO TABS
80.0000 mg | ORAL_TABLET | Freq: Every evening | ORAL | Status: DC
  Administered 2022-07-23 – 2022-07-26 (×3): 80 mg via ORAL
  Filled 2022-07-23 (×3): qty 4

## 2022-07-23 MED ORDER — FLUTICASONE PROPIONATE 50 MCG/ACT NA SUSP: 2.0000 | Freq: Every day | NASAL | Status: DC | PRN

## 2022-07-23 MED ORDER — IOHEXOL 350 MG/ML SOLN
100.0000 mL | Freq: Once | INTRAVENOUS | Status: AC | PRN
  Administered 2022-07-23: 100 mL via INTRAVENOUS

## 2022-07-23 MED ORDER — LIDOCAINE-PRILOCAINE 2.5-2.5 % EX CREA: 1.0000 | TOPICAL_CREAM | CUTANEOUS | Status: DC | PRN

## 2022-07-23 MED ORDER — SUCROFERRIC OXYHYDROXIDE 500 MG PO CHEW
1500.0000 mg | CHEWABLE_TABLET | Freq: Three times a day (TID) | ORAL | Status: DC
  Administered 2022-07-23 – 2022-07-26 (×8): 1500 mg via ORAL
  Filled 2022-07-23 (×2): qty 3
  Filled 2022-07-23: qty 1
  Filled 2022-07-23 (×12): qty 3

## 2022-07-23 MED ORDER — ALTEPLASE 2 MG IJ SOLR: 2.0000 mg | Freq: Once | INTRAMUSCULAR | Status: DC | PRN

## 2022-07-23 MED ORDER — PREGABALIN 50 MG PO CAPS
50.0000 mg | ORAL_CAPSULE | Freq: Two times a day (BID) | ORAL | Status: DC
  Administered 2022-07-23 – 2022-07-26 (×6): 50 mg via ORAL
  Filled 2022-07-23 (×2): qty 1
  Filled 2022-07-23: qty 2
  Filled 2022-07-23 (×3): qty 1

## 2022-07-23 MED ORDER — FENTANYL CITRATE PF 50 MCG/ML IJ SOSY: 25.0000 ug | PREFILLED_SYRINGE | INTRAMUSCULAR | Status: DC | PRN

## 2022-07-23 MED ORDER — INSULIN ISOPHANE HUMAN 100 UNIT/ML KWIKPEN: 100.0000 [IU] | PEN_INJECTOR | SUBCUTANEOUS | Status: DC

## 2022-07-23 MED ORDER — PIPERACILLIN-TAZOBACTAM IN DEX 2-0.25 GM/50ML IV SOLN
2.2500 g | Freq: Three times a day (TID) | INTRAVENOUS | Status: DC
  Administered 2022-07-23 – 2022-07-26 (×9): 2.25 g via INTRAVENOUS
  Filled 2022-07-23 (×11): qty 50

## 2022-07-23 MED ORDER — SODIUM CHLORIDE 0.9% IV SOLUTION
Freq: Once | INTRAVENOUS | Status: AC
  Administered 2022-07-25: 10 mL/h via INTRAVENOUS
  Filled 2022-07-23: qty 250

## 2022-07-23 MED ORDER — METHYLPREDNISOLONE SODIUM SUCC 40 MG IJ SOLR
40.0000 mg | INTRAMUSCULAR | Status: DC
  Administered 2022-07-23 – 2022-07-24 (×2): 40 mg via INTRAVENOUS
  Filled 2022-07-23 (×2): qty 1

## 2022-07-23 MED ORDER — NITROGLYCERIN 0.4 MG SL SUBL: 0.4000 mg | SUBLINGUAL_TABLET | SUBLINGUAL | Status: DC | PRN

## 2022-07-23 MED ORDER — INSULIN ASPART PROT & ASPART (70-30 MIX) 100 UNIT/ML ~~LOC~~ SUSP: 30.0000 [IU] | Freq: Two times a day (BID) | SUBCUTANEOUS | Status: DC

## 2022-07-23 MED ORDER — ACETAMINOPHEN 325 MG PO TABS: 650.0000 mg | ORAL_TABLET | Freq: Four times a day (QID) | ORAL | Status: DC | PRN

## 2022-07-23 MED ORDER — VANCOMYCIN HCL 2000 MG/400ML IV SOLN
2000.0000 mg | Freq: Once | INTRAVENOUS | Status: AC
  Administered 2022-07-23: 2000 mg via INTRAVENOUS
  Filled 2022-07-23: qty 400

## 2022-07-23 MED ORDER — PANTOPRAZOLE SODIUM 40 MG IV SOLR
40.0000 mg | Freq: Two times a day (BID) | INTRAVENOUS | Status: DC
  Administered 2022-07-23 – 2022-07-24 (×4): 40 mg via INTRAVENOUS
  Filled 2022-07-23 (×4): qty 10

## 2022-07-23 MED ORDER — INSULIN ASPART 100 UNIT/ML IJ SOLN
0.0000 [IU] | Freq: Three times a day (TID) | INTRAMUSCULAR | Status: DC
  Administered 2022-07-23: 1 [IU] via SUBCUTANEOUS
  Administered 2022-07-23: 2 [IU] via SUBCUTANEOUS
  Administered 2022-07-24 (×2): 5 [IU] via SUBCUTANEOUS
  Administered 2022-07-24: 9 [IU] via SUBCUTANEOUS
  Administered 2022-07-25: 1 [IU] via SUBCUTANEOUS
  Administered 2022-07-26: 5 [IU] via SUBCUTANEOUS
  Administered 2022-07-26: 2 [IU] via SUBCUTANEOUS
  Filled 2022-07-23 (×8): qty 1

## 2022-07-23 MED ORDER — HYDROXYZINE HCL 50 MG PO TABS
25.0000 mg | ORAL_TABLET | Freq: Three times a day (TID) | ORAL | Status: DC | PRN
  Filled 2022-07-23: qty 1

## 2022-07-23 MED ORDER — PENTAFLUOROPROP-TETRAFLUOROETH EX AERO: 1.0000 | INHALATION_SPRAY | CUTANEOUS | Status: DC | PRN

## 2022-07-23 MED ORDER — METOCLOPRAMIDE HCL 5 MG/ML IJ SOLN: 10.0000 mg | Freq: Four times a day (QID) | INTRAMUSCULAR | Status: DC | PRN

## 2022-07-23 MED ORDER — MIDODRINE HCL 5 MG PO TABS
10.0000 mg | ORAL_TABLET | Freq: Every day | ORAL | Status: DC
  Administered 2022-07-23 – 2022-07-26 (×4): 10 mg via ORAL
  Filled 2022-07-23 (×4): qty 2

## 2022-07-23 MED ORDER — HEPARIN SODIUM (PORCINE) 5000 UNIT/ML IJ SOLN
5000.0000 [IU] | Freq: Three times a day (TID) | INTRAMUSCULAR | Status: DC
  Administered 2022-07-23 (×2): 5000 [IU] via SUBCUTANEOUS
  Filled 2022-07-23 (×2): qty 1

## 2022-07-23 MED ORDER — ASPIRIN 81 MG PO TBEC
81.0000 mg | DELAYED_RELEASE_TABLET | Freq: Every day | ORAL | Status: DC
  Administered 2022-07-23 – 2022-07-26 (×3): 81 mg via ORAL
  Filled 2022-07-23 (×3): qty 1

## 2022-07-23 MED ORDER — METHOCARBAMOL 500 MG PO TABS: 500.0000 mg | ORAL_TABLET | Freq: Two times a day (BID) | ORAL | Status: DC | PRN

## 2022-07-23 MED ORDER — BISACODYL 5 MG PO TBEC
10.0000 mg | DELAYED_RELEASE_TABLET | Freq: Every day | ORAL | Status: DC
  Administered 2022-07-23: 10 mg via ORAL
  Filled 2022-07-23 (×3): qty 2

## 2022-07-23 MED ORDER — CARVEDILOL 6.25 MG PO TABS
12.5000 mg | ORAL_TABLET | Freq: Two times a day (BID) | ORAL | Status: DC
  Administered 2022-07-23 – 2022-07-26 (×5): 12.5 mg via ORAL
  Filled 2022-07-23 (×5): qty 2

## 2022-07-23 MED ORDER — NALOXONE HCL 4 MG/0.1ML NA LIQD: 1.0000 | NASAL | Status: DC

## 2022-07-23 MED ORDER — ANTICOAGULANT SODIUM CITRATE 4% (200MG/5ML) IV SOLN: 5.0000 mL | Status: DC | PRN

## 2022-07-23 MED ORDER — VALACYCLOVIR HCL 500 MG PO TABS
500.0000 mg | ORAL_TABLET | Freq: Every day | ORAL | Status: DC
  Administered 2022-07-23 – 2022-07-26 (×3): 500 mg via ORAL
  Filled 2022-07-23 (×4): qty 1

## 2022-07-23 MED ORDER — FLUTICASONE FUROATE-VILANTEROL 200-25 MCG/ACT IN AEPB
1.0000 | INHALATION_SPRAY | Freq: Every day | RESPIRATORY_TRACT | Status: DC
  Administered 2022-07-24 – 2022-07-26 (×2): 1 via RESPIRATORY_TRACT
  Filled 2022-07-23: qty 28

## 2022-07-23 MED ORDER — ALBUTEROL SULFATE (2.5 MG/3ML) 0.083% IN NEBU
3.0000 mL | INHALATION_SOLUTION | RESPIRATORY_TRACT | Status: DC | PRN
  Administered 2022-07-23: 3 mL via RESPIRATORY_TRACT
  Filled 2022-07-23: qty 3

## 2022-07-23 MED ORDER — VANCOMYCIN HCL IN DEXTROSE 1-5 GM/200ML-% IV SOLN: 1000.0000 mg | Freq: Once | INTRAVENOUS | Status: DC

## 2022-07-23 MED ORDER — INSULIN ASPART 100 UNIT/ML IJ SOLN
0.0000 [IU] | Freq: Every day | INTRAMUSCULAR | Status: DC
  Administered 2022-07-23: 2 [IU] via SUBCUTANEOUS
  Administered 2022-07-24: 3 [IU] via SUBCUTANEOUS
  Administered 2022-07-25: 2 [IU] via SUBCUTANEOUS
  Filled 2022-07-23 (×3): qty 1

## 2022-07-23 MED ORDER — PIPERACILLIN-TAZOBACTAM 3.375 G IVPB 30 MIN
3.3750 g | Freq: Once | INTRAVENOUS | Status: AC
  Administered 2022-07-23: 3.375 g via INTRAVENOUS
  Filled 2022-07-23: qty 50

## 2022-07-23 MED ORDER — POLYETHYLENE GLYCOL 3350 17 G PO PACK
17.0000 g | PACK | Freq: Every day | ORAL | Status: DC
  Administered 2022-07-26: 17 g via ORAL
  Filled 2022-07-23 (×2): qty 1

## 2022-07-23 MED ORDER — VANCOMYCIN HCL IN DEXTROSE 1-5 GM/200ML-% IV SOLN
1000.0000 mg | INTRAVENOUS | Status: DC
  Administered 2022-07-23 – 2022-07-25 (×2): 1000 mg via INTRAVENOUS
  Filled 2022-07-23 (×2): qty 200

## 2022-07-23 MED ORDER — ACETAMINOPHEN 650 MG RE SUPP: 650.0000 mg | Freq: Four times a day (QID) | RECTAL | Status: DC | PRN

## 2022-07-23 MED ORDER — POLYETHYLENE GLYCOL 3350 17 G PO PACK: 17.0000 g | PACK | Freq: Every day | ORAL | Status: DC | PRN

## 2022-07-23 MED ORDER — COLCHICINE 0.3 MG HALF TABLET
0.3000 mg | ORAL_TABLET | Freq: Every day | ORAL | Status: DC
  Administered 2022-07-23 – 2022-07-24 (×2): 0.3 mg via ORAL
  Filled 2022-07-23 (×2): qty 1

## 2022-07-23 MED ORDER — OXYCODONE HCL 5 MG PO TABS
10.0000 mg | ORAL_TABLET | Freq: Four times a day (QID) | ORAL | Status: DC
  Administered 2022-07-23 – 2022-07-26 (×12): 10 mg via ORAL
  Filled 2022-07-23 (×13): qty 2

## 2022-07-23 NOTE — Assessment & Plan Note (Signed)
-   Nephrology consult will be obtained. - Dr. Candiss Norse was notified about the patient.

## 2022-07-23 NOTE — ED Notes (Signed)
Pt provided a fresh cup of ice. Pt offered to be repositioned in bed and pt declined. Pt informed to press call light in case she needed any other assistance. Pt said "Okay, thank you." Pt denied needing anything else at this time.

## 2022-07-23 NOTE — ED Notes (Signed)
Nurse at bedside. Patient takes PO medications without complications.

## 2022-07-23 NOTE — Assessment & Plan Note (Signed)
-   The patient will be placed on supplemental coverage with NovoLog. - We will continue her basal coverage. 

## 2022-07-23 NOTE — Assessment & Plan Note (Signed)
-   We will continue her antihypertensives. 

## 2022-07-23 NOTE — ED Notes (Signed)
Patient taken to CT.

## 2022-07-23 NOTE — ED Provider Notes (Signed)
[facility] Provider Note    Event Date/Time   First MD Initiated Contact with Patient 07/23/22 0019     (approximate)   History   Foot Pain   HPI  [name] is a 67 y.o. female who presents to the ED for evaluation of Foot Pain   I review an outside ED visit from 1/9.  Also seen for left foot pain, had a large blister in the heel that was drained, x-rays without osteomyelitis and she was prescribed clindamycin and Bactrim. Hgb 8.1 then.  ESRD on iHD T,Th,S  Patient's daughter brings her into the ED for evaluation of progressive pain of her left heel and calf over the past couple days.  They have not picked up any of the antibiotics prescribed yesterday and not taken any antibiotics.  No fever or systemic symptoms.  They did miss dialysis on Tuesday because of the pain from her foot.  Physical Exam   Triage Vital Signs: ED Triage Vitals  Enc Vitals Group     BP 07/22/22 2210 114/61     Pulse Rate 07/22/22 2210 73     Resp 07/22/22 2210 20     Temp 07/22/22 2213 (!) 97.5 F (36.4 C)     Temp Source 07/22/22 2213 Oral     SpO2 07/22/22 2210 100 %     Weight 07/22/22 2211 260 lb (117.9 kg)     Height 07/22/22 2211 '5\' 8"'$  (1.727 m)     Head Circumference --      Peak Flow --      Pain Score 07/22/22 2211 0     Pain Loc --      Pain Edu? --      Excl. in [place]? --     Most recent vital signs: Vitals:   07/23/22 0254 07/23/22 0300  BP: 130/69 132/70  Pulse: 69 69  Resp: 20 20  Temp: 98.5 F (36.9 C)   SpO2: 94% 93%    General: Awake, no distress.  Obese. CV:  Good peripheral perfusion.  Resp:  Normal effort.  Abd:  No distention.  MSK:  Deflated blister to the medial aspect of the left heel with surrounding warmth that does extend up her calf.  She is tender throughout this area posteriorly of the left lower leg up to the popliteal fossa.  No crepitus.  Neuro:  No focal deficits appreciated. Other:     ED Results / Procedures /  Treatments   Labs (all labs ordered are listed, but only abnormal results are displayed) Labs Reviewed  BASIC METABOLIC PANEL - Abnormal; Notable for the following components:      Result Value   Sodium 134 (*)    Potassium 5.3 (*)    Chloride 92 (*)    Glucose, Bld 292 (*)    BUN 84 (*)    Creatinine, Ser 12.47 (*)    Calcium 8.4 (*)    GFR, Estimated 3 (*)    Anion gap 16 (*)    All other components within normal limits  CBC - Abnormal; Notable for the following components:   RBC 3.32 (*)    Hemoglobin 7.9 (*)    HCT 26.8 (*)    MCH 23.8 (*)    MCHC 29.5 (*)    RDW 24.3 (*)    All other components within normal limits  CULTURE, BLOOD (ROUTINE X 2)  CULTURE, BLOOD (ROUTINE X 2)  PROCALCITONIN  LACTIC ACID, PLASMA  LACTIC  ACID, PLASMA    EKG   RADIOLOGY Plain film of the foot interpreted by me without evidence of fracture or dislocation CXR interpreted by me without evidence of acute cardiopulmonary pathology.  Official radiology report(s): CT EXTREMITY LOWER LEFT W CONTRAST  Result Date: 07/23/2022 CLINICAL DATA:  Diabetes, infected left heel wound, ascending left lower extremity infection EXAM: CT OF THE LOWER LEFT EXTREMITY WITH CONTRAST TECHNIQUE: Multidetector CT imaging of the lower left extremity was performed according to the standard protocol following intravenous contrast administration. RADIATION DOSE REDUCTION: This exam was performed according to the departmental dose-optimization program which includes automated exposure control, adjustment of the mA and/or kV according to patient size and/or use of iterative reconstruction technique. CONTRAST:  166m OMNIPAQUE IOHEXOL 350 MG/ML SOLN COMPARISON:  None Available. FINDINGS: Bones/Joint/Cartilage Normal alignment. No acute fracture or dislocation. No osseous erosions or abnormal periosteal reaction. Mild-to-moderate degenerative arthritis within the left knee, most severe within the medial compartment. Remaining  joint spaces are preserved. Ligaments Suboptimally assessed by CT. Muscles and Tendons Intramuscular lipoma within the left adductor magnus muscle belly measuring 3.5 x 1.5 cm. Otherwise unremarkable. Soft tissues Visualized abdominopelvic contents are unremarkable. Probable 21 mm left Bartholin gland cyst. Mild nonspecific subcutaneous edema involving the anterolateral left thigh. Infiltration within the medial subcutaneous fat of the left thigh at the level of the distal diaphysis of the femur with associated coarse calcification likely postsurgical or posttraumatic in nature. There is subcutaneous edema within the left hindfoot extending superiorly to the level of the mid calf. No discrete drainable subcutaneous fluid collection identified. Inflammatory stranding appears confined to the subcutaneous compartment and there is no infiltration of the intrafascial fat of the deep compartments of the left lower extremity. No subcutaneous gas identified. Small left knee effusion. IMPRESSION: 1. Subcutaneous edema within the left hindfoot extending superiorly to the level of the mid calf. No discrete drainable subcutaneous fluid collection identified. No infiltration of the intrafascial fat of the deep compartments of the left lower extremity. No subcutaneous gas identified. 2. Infiltration within the medial subcutaneous fat of the left thigh at the level of the distal diaphysis of the femur with associated coarse calcification likely postsurgical or posttraumatic in nature. 3. Mild-to-moderate degenerative arthritis within the left knee, most severe within the medial compartment. Small left knee effusion. Electronically Signed   By: AFidela SalisburyM.D.   On: 07/23/2022 02:31   DG Chest Port 1 View  Result Date: 07/22/2022 CLINICAL DATA:  Missed hemodialysis. EXAM: PORTABLE CHEST 1 VIEW COMPARISON:  May 26, 2022 FINDINGS: The heart size and mediastinal contours are within normal limits. Mild linear atelectasis  is seen within the left infrahilar region and lateral aspect of the right lung base. There is no evidence of an acute infiltrate, pleural effusion or pneumothorax. The visualized skeletal structures are unremarkable. IMPRESSION: Mild left infrahilar and right basilar linear atelectasis. Electronically Signed   By: TVirgina NorfolkM.D.   On: 07/22/2022 23:35   DG Foot Complete Left  Result Date: 07/22/2022 CLINICAL DATA:  Left foot pain. EXAM: LEFT FOOT - COMPLETE 3+ VIEW COMPARISON:  None Available. FINDINGS: There is no evidence of an acute fracture or dislocation. There is no evidence of arthropathy or other focal bone abnormality. Mild to moderate severity plantar soft tissue swelling is seen. IMPRESSION: Mild to moderate severity plantar soft tissue swelling. Electronically Signed   By: TVirgina NorfolkM.D.   On: 07/22/2022 23:34    PROCEDURES and INTERVENTIONS:  Ultrasound ED Peripheral  IV (Provider)  Date/Time: 07/23/2022 4:05 AM  Performed by: Vladimir Crofts, MD Authorized by: Vladimir Crofts, MD   Procedure details:    Indications: multiple failed IV attempts and poor IV access     Skin Prep: chlorhexidine gluconate     Location: right cephalic v.   Angiocath:  20 G   Bedside Ultrasound Guided: Yes     Images: not archived     Patient tolerated procedure without complications: Yes     Dressing applied: Yes     Medications  vancomycin (VANCOREADY) IVPB 2000 mg/400 mL (2,000 mg Intravenous New Bag/Given 07/23/22 0227)  fentaNYL (SUBLIMAZE) injection 25 mcg (has no administration in time range)  piperacillin-tazobactam (ZOSYN) IVPB 3.375 g (0 g Intravenous Stopped 07/23/22 0339)  iohexol (OMNIPAQUE) 350 MG/ML injection 100 mL (100 mLs Intravenous Contrast Given 07/23/22 0213)     IMPRESSION / MDM / ASSESSMENT AND PLAN / ED COURSE  I reviewed the triage vital signs and the nursing notes.  Differential diagnosis includes, but is not limited to, cellulitis, sepsis, necrotizing  fasciitis, osteomyelitis  {Patient presents with symptoms of an acute illness or injury that is potentially life-threatening.  Diabetic dialysis patient presents to the ED with evidence of lower leg infection requiring medical admission.  No signs of sepsis.  Provided broad-spectrum antibiotics.  Chronic anemia on the CBC.  Metabolic panel with her known stigmata of ESRD without indications for emergent dialysis.  No lactic acidosis.  Does have an elevated procalcitonin that we will initiate a trend.  CT obtained without evidence of subcutaneous or deep space gases to suggest necrotizing fasciitis.  Will consult with medicine for admission  Clinical Course as of 07/23/22 0406  Thu Jul 23, 2022  0130 USIV placed by me [DS]    Clinical Course User Index [DS] Vladimir Crofts, MD     FINAL CLINICAL IMPRESSION(S) / ED DIAGNOSES   Final diagnoses:  Cellulitis of left lower extremity  ESRD (end stage renal disease) on dialysis [facility])     Rx / DC Orders   ED Discharge Orders     None        Note:  This document was prepared using Dragon voice recognition software and may include unintentional dictation errors.   Vladimir Crofts, MD 07/23/22 [phone]

## 2022-07-23 NOTE — ED Notes (Signed)
Spoke with MD Dwyane Dee and HD RN states will admin blood during HD later today. MD agreeable to plan. Pt electronically consented.

## 2022-07-23 NOTE — Progress Notes (Signed)
[facility]  ROUNDING NOTE   Subjective:   [name] 67 y.o. female with end-stage renal disease on hemodialysis, diabetes type 2, hypertension, coronary disease status post stent placement, history of MI in 2017, peripheral arterial disease, hyperlipidemia, GERD, chronic combined systolic and diastolic CHF, gout, asthma, hepatitis C, anemia, history of colostomy was admitted for Cellulitis of left lower extremity [L03.116]  She presents to the ED with left leg pain.  She currently receives outpatient dialysis treatments at [facility] on a TTS schedule, supervised by [facility] nephrologist.  Patient missed treatment on Tuesday due to foot pain.  Patient is seen resting on ED stretcher.  Somnolent from receiving pain medication.  Denies nausea and vomiting.  Continues report intermittent pain from left foot.  Labs on ED arrival include potassium 5.3,glucose 292, BUN 84, creatinine 12.47 with GFR 3, and hemoglobin 7.9.  Left foot x-Goldenstein shows soft tissue swelling.  Chest x-Berry shows right basilar atelectasis.  Left lower CT confirms subcutaneous edema and mild to moderate degenerative arthritis in the left knee.  We have been consulted to manage dialysis needs during this admission.   Objective:  Vital signs in last 24 hours:  Temp:  [97.5 F (36.4 C)-98.5 F (36.9 C)] 98.5 F (36.9 C) (01/11 0700) Pulse Rate:  [58-73] 62 (01/11 1410) Resp:  [18-20] 18 (01/11 0700) BP: (99-132)/(50-97) 114/50 (01/11 1413) SpO2:  [91 %-100 %] 92 % (01/11 1410) Weight:  [117.9 kg] 117.9 kg (01/10 2211)  Weight change:  Filed Weights   07/22/22 2211  Weight: 117.9 kg    Intake/Output: I/O last 3 completed shifts: In: 500 [IV Piggyback:500] Out: -    Intake/Output this shift:  No intake/output data recorded.  Physical Exam: General: NAD  Head: Normocephalic, atraumatic. Moist oral mucosal membranes  Eyes: Anicteric  Lungs:  Clear to auscultation, normal effort, 1 L [place]   Heart: Regular rate and rhythm  Abdomen:  Soft, nontender, obese  Extremities: Trace peripheral edema.  Neurologic: Somnolent but arousable for short.  Moving all four extremities  Skin: No lesions  Access: Left aVF    Basic Metabolic Panel: Recent Labs  Lab 07/22/22 2213 07/23/22 0645  NA 134* 132*  K 5.3* 5.0  CL 92* 94*  CO2 26 21*  GLUCOSE 292* 149*  BUN 84* 84*  CREATININE 12.47* 12.64*  CALCIUM 8.4* 8.2*    Liver Function Tests: No results for input(s): "AST", "ALT", "ALKPHOS", "BILITOT", "PROT", "ALBUMIN" in the last 168 hours. No results for input(s): "LIPASE", "AMYLASE" in the last 168 hours. No results for input(s): "AMMONIA" in the last 168 hours.  CBC: Recent Labs  Lab 07/22/22 2213 07/23/22 0645  WBC 5.2 4.8  HGB 7.9* 7.0*  HCT 26.8* 23.6*  MCV 80.7 81.4  PLT 188 184    Cardiac Enzymes: No results for input(s): "CKTOTAL", "CKMB", "CKMBINDEX", "TROPONINI" in the last 168 hours.  BNP: Invalid input(s): "POCBNP"  CBG: Recent Labs  Lab 07/23/22 0809 07/23/22 1239  GLUCAP 129* 182*    Microbiology: Results for orders placed or performed during the hospital encounter of 07/23/22  Blood culture (routine x 2)     Status: None (Preliminary result)   Collection Time: 07/23/22  1:41 AM   Specimen: BLOOD RIGHT ARM  Result Value Ref Range Status   Specimen Description BLOOD RIGHT ARM  Final   Special Requests   Final    BOTTLES DRAWN AEROBIC AND ANAEROBIC Blood Culture adequate volume   Culture   Final  NO GROWTH < 12 HOURS Performed at [facility] [address] [place], [place] [address]    Report Status PENDING  Incomplete  Blood culture (routine x 2)     Status: None (Preliminary result)   Collection Time: 07/23/22  1:41 AM   Specimen: BLOOD RIGHT ARM  Result Value Ref Range Status   Specimen Description BLOOD RIGHT ARM  Final   Special Requests   Final    BOTTLES DRAWN AEROBIC AND ANAEROBIC Blood Culture adequate volume    Culture   Final    NO GROWTH < 12 HOURS Performed at [facility], [address] [place], [place] [address]    Report Status PENDING  Incomplete   *Note: Due to a large number of results and/or encounters for the requested time period, some results have not been displayed. A complete set of results can be found in Results Review.    Coagulation Studies: No results for input(s): "LABPROT", "INR" in the last 72 hours.  Urinalysis: No results for input(s): "COLORURINE", "LABSPEC", "PHURINE", "GLUCOSEU", "HGBUR", "BILIRUBINUR", "KETONESUR", "PROTEINUR", "UROBILINOGEN", "NITRITE", "LEUKOCYTESUR" in the last 72 hours.  Invalid input(s): "APPERANCEUR"    Imaging: CT EXTREMITY LOWER LEFT W CONTRAST  Result Date: 07/23/2022 CLINICAL DATA:  Diabetes, infected left heel wound, ascending left lower extremity infection EXAM: CT OF THE LOWER LEFT EXTREMITY WITH CONTRAST TECHNIQUE: Multidetector CT imaging of the lower left extremity was performed according to the standard protocol following intravenous contrast administration. RADIATION DOSE REDUCTION: This exam was performed according to the departmental dose-optimization program which includes automated exposure control, adjustment of the mA and/or kV according to patient size and/or use of iterative reconstruction technique. CONTRAST:  1103m OMNIPAQUE IOHEXOL 350 MG/ML SOLN COMPARISON:  None Available. FINDINGS: Bones/Joint/Cartilage Normal alignment. No acute fracture or dislocation. No osseous erosions or abnormal periosteal reaction. Mild-to-moderate degenerative arthritis within the left knee, most severe within the medial compartment. Remaining joint spaces are preserved. Ligaments Suboptimally assessed by CT. Muscles and Tendons Intramuscular lipoma within the left adductor magnus muscle belly measuring 3.5 x 1.5 cm. Otherwise unremarkable. Soft tissues Visualized abdominopelvic contents are unremarkable. Probable 21 mm left Bartholin  gland cyst. Mild nonspecific subcutaneous edema involving the anterolateral left thigh. Infiltration within the medial subcutaneous fat of the left thigh at the level of the distal diaphysis of the femur with associated coarse calcification likely postsurgical or posttraumatic in nature. There is subcutaneous edema within the left hindfoot extending superiorly to the level of the mid calf. No discrete drainable subcutaneous fluid collection identified. Inflammatory stranding appears confined to the subcutaneous compartment and there is no infiltration of the intrafascial fat of the deep compartments of the left lower extremity. No subcutaneous gas identified. Small left knee effusion. IMPRESSION: 1. Subcutaneous edema within the left hindfoot extending superiorly to the level of the mid calf. No discrete drainable subcutaneous fluid collection identified. No infiltration of the intrafascial fat of the deep compartments of the left lower extremity. No subcutaneous gas identified. 2. Infiltration within the medial subcutaneous fat of the left thigh at the level of the distal diaphysis of the femur with associated coarse calcification likely postsurgical or posttraumatic in nature. 3. Mild-to-moderate degenerative arthritis within the left knee, most severe within the medial compartment. Small left knee effusion. Electronically Signed   By: AFidela SalisburyM.D.   On: 07/23/2022 02:31   DG Chest Port 1 View  Result Date: 07/22/2022 CLINICAL DATA:  Missed hemodialysis. EXAM: PORTABLE CHEST 1 VIEW COMPARISON:  May 26, 2022 FINDINGS: The heart size and mediastinal contours are within normal limits. Mild linear atelectasis is seen within the left infrahilar region and lateral aspect of the right lung base. There is no evidence of an acute infiltrate, pleural effusion or pneumothorax. The visualized skeletal structures are unremarkable. IMPRESSION: Mild left infrahilar and right basilar linear atelectasis.  Electronically Signed   By: Virgina Norfolk M.D.   On: 07/22/2022 23:35   DG Foot Complete Left  Result Date: 07/22/2022 CLINICAL DATA:  Left foot pain. EXAM: LEFT FOOT - COMPLETE 3+ VIEW COMPARISON:  None Available. FINDINGS: There is no evidence of an acute fracture or dislocation. There is no evidence of arthropathy or other focal bone abnormality. Mild to moderate severity plantar soft tissue swelling is seen. IMPRESSION: Mild to moderate severity plantar soft tissue swelling. Electronically Signed   By: Virgina Norfolk M.D.   On: 07/22/2022 23:34     Medications:    anticoagulant sodium citrate     piperacillin-tazobactam Stopped (07/23/22 1319)    sodium chloride   Intravenous Once   aspirin EC  81 mg Oral Daily   atorvastatin  80 mg Oral QPM   bisacodyl  10 mg Oral QHS   carvedilol  12.5 mg Oral BID WC   [START ON 07/24/2022] Chlorhexidine Gluconate Cloth  6 each Topical Q0600   colchicine  0.3 mg Oral Daily   [START ON 07/24/2022] fluticasone furoate-vilanterol  1 puff Inhalation Daily   heparin  5,000 Units Subcutaneous Q8H   insulin aspart  0-5 Units Subcutaneous QHS   insulin aspart  0-9 Units Subcutaneous TID WC   insulin aspart protamine- aspart  30 Units Subcutaneous BID WC   methylPREDNISolone (SOLU-MEDROL) injection  40 mg Intravenous Q24H   midodrine  10 mg Oral Q lunch   naloxone  1 spray Nasal UD   oxyCODONE  10 mg Oral Q6H   pantoprazole (PROTONIX) IV  40 mg Intravenous Q12H   polyethylene glycol  17 g Oral Daily   pregabalin  50 mg Oral BID   sucroferric oxyhydroxide  1,500 mg Oral TID   valACYclovir  500 mg Oral Daily   acetaminophen **OR** acetaminophen, albuterol, alteplase, anticoagulant sodium citrate, fentaNYL (SUBLIMAZE) injection, fluticasone, heparin, hydrOXYzine, lidocaine (PF), lidocaine-prilocaine, methocarbamol, metoCLOPramide (REGLAN) injection, nitroGLYCERIN, pentafluoroprop-tetrafluoroeth, traZODone  Assessment/ Plan:  [name] is a  67 y.o.  female end-stage renal disease on hemodialysis, diabetes type 2, hypertension, coronary disease status post stent placement, history of MI in 2017, peripheral arterial disease, hyperlipidemia, GERD, chronic combined systolic and diastolic CHF, gout, asthma, hepatitis C, anemia, history of colostomy was admitted for Cellulitis of left lower extremity [L03.116]   End-stage renal disease on hemodialysis.  Patient reports missing treatment on Tuesday.  Will provide dialysis today as scheduled.  UF goal 2 L as tolerated.  Next treatment scheduled for Saturday.  2. Anemia of chronic kidney disease Lab Results  Component Value Date   HGB 7.0 (L) 07/23/2022    Hemoglobin below desired target.  Patient receives [place] outpatient.  Will defer need of blood transfusion to primary team.  3. Secondary Hyperparathyroidism: with outpatient labs: PTH 664, phosphorus 6.7, calcium 9.0 on 07/16/2022.   Lab Results  Component Value Date   PTH 232 (H) 08/16/2017   CALCIUM 8.2 (L) 07/23/2022   CAION 0.86 (LL) 05/26/2022   PHOS 4.4 12/20/2021    Will continue to monitor bone minerals during this admission.  Patient currently prescribed vitamin D supplementation and Auryxia with  meals outpatient.  4.  Hypertension with chronic kidney disease.  Home regimen includes carvedilol and midodrine.  Both currently prescribed.   LOS: 0   1/11/20242:52 PM

## 2022-07-23 NOTE — Assessment & Plan Note (Signed)
-   We will continue Lyrica.

## 2022-07-23 NOTE — Progress Notes (Signed)
Pharmacy Antibiotic Note  [name] is a 67 y.o. female w/ ESRD on TThSa HD, admitted on 07/23/2022 with cellulitis.  Pharmacy has been consulted for Vancomycin dosing.  Plan: Pt given Vancomycin 2500 mg once. Vancomycin 1000 mg IV  qTThSa, following dialysis.   Pharmacy will continue to follow and will adjust abx dosing whenever warranted.  Temp (24hrs), Avg:98 F (36.7 C), Min:97.5 F (36.4 C), Max:98.5 F (36.9 C)   Recent Labs  Lab 07/22/22 2213 07/23/22 0141  WBC 5.2  --   CREATININE 12.47*  --   LATICACIDVEN  --  1.4    Estimated Creatinine Clearance: 6.2 mL/min (A) (by C-G formula based on SCr of 12.47 mg/dL (H)).    Allergies  Allergen Reactions   Morphine Itching    Tolerated hydromorphone on 07/2020 *will take along with Benadryl*   Shellfish Allergy Anaphylaxis and Swelling   Diazepam Other (See Comments)    "felt like out of body experience"    Antimicrobials this admission: 1/11 Zosyn >> x 1 dose 1/11 Vancomycin >> PTA Med: Valtrex 500 mg >> QOD  Microbiology results: 1/11 BCx: Pending  Thank you for allowing pharmacy to be a part of this patient's care.  Renda Rolls, PharmD, [facility] 07/23/2022 5:01 AM

## 2022-07-23 NOTE — Assessment & Plan Note (Signed)
-   We will continue colchicine.

## 2022-07-23 NOTE — Progress Notes (Signed)
   07/23/22 1300  Spiritual Encounters  Type of Visit Initial  Care provided to: Patient  Referral source Nurse (RN/NT/LPN)  Reason for visit Grief/loss  OnCall Visit Yes  Spiritual Framework  Presenting Themes Significant life change  Interventions  Spiritual Care Interventions Made Compassionate presence;Prayer;Supported grief process  Intervention Outcomes  Outcomes Reduced anxiety   Patient expressed grief of loss of mobility and changes in her life. Chaplain interceded by providing reflective listening, calming presence and prayer.

## 2022-07-23 NOTE — Progress Notes (Signed)
Pt ran for 3.5hrs and removed 2.5L fluid  transfued 1 Unit blood    07/23/22 1928  Vitals  Temp 97.6 F (36.4 C)  Temp Source Oral  BP (!) 138/93  MAP (mmHg) 108  BP Location Right Wrist  BP Method Automatic  Patient Position (if appropriate) Lying  Pulse Rate 73  Pulse Rate Source Monitor  ECG Heart Rate 73  Resp 14  Oxygen Therapy  SpO2 100 %  O2 Device Room Air  Patient Activity (if Appropriate) In bed  Pulse Oximetry Type Continuous  During Treatment Monitoring  HD Safety Checks Performed Yes  Intra-Hemodialysis Comments Tx completed;Tolerated well  Post Treatment  Dialyzer Clearance Lightly streaked  Duration of HD Treatment -hour(s) 3.5 hour(s)  Hemodialysis Intake (mL) 320 mL  Liters Processed 78.2  Fluid Removed (mL) 2500 mL  Tolerated HD Treatment Yes  Post-Hemodialysis Comments pt stable and tolerated well

## 2022-07-23 NOTE — Progress Notes (Signed)
OT Cancellation Note  Patient Details Name: [name] MRN: [id number] DOB: 11/20/1955   Cancelled Treatment:    Reason Eval/Treat Not Completed: Other (comment) (pt OTF for HD per nurse; OT will reattempt as able.Shanon Payor, OTD OTR/L  07/23/22, 3:22 PM

## 2022-07-23 NOTE — Assessment & Plan Note (Signed)
-   We will continue PPI therapy 

## 2022-07-23 NOTE — ED Notes (Signed)
Chaplin at bedside per pt request. Wanted someone to talk too.

## 2022-07-23 NOTE — Plan of Care (Signed)
Patient was seen and examined at bedside, patient was admitted due to blister and pain and swelling in the left heel, patient was unable to walk.  Patient has some cellulitis and uric acid was high 9.9 possible gout.  Patient was started on colchicine, prednisone. Anemia of chronic disease due to ESRD, hemoglobin 7.0, patient agreed with the blood transfusion. Check FOBT to rule out GI bleeding, started pantoprazole 40 mg IV twice daily as patient was complaining of dark stools. Continue fall precautions, offload left heel Follow PT and OT eval.  We will continue current management. Nephrology consulted for hemodialysis, TTS schedule.

## 2022-07-23 NOTE — ED Notes (Signed)
Patient returned from CT at this time.

## 2022-07-23 NOTE — Assessment & Plan Note (Signed)
-   The patient will be admitted to a medical bed. - Will continue antibiotic therapy with IV Zosyn and vancomycin. - Pain management will be provided. - Warm compresses will be applied.

## 2022-07-23 NOTE — H&P (Signed)
[place]   PATIENT NAME: [name]    MR#:  [id number]  DATE OF BIRTH:  1956-05-25  DATE OF ADMISSION:  07/23/2022  PRIMARY CARE PHYSICIAN: [facility], [place]   Patient is coming from: Home  REQUESTING/REFERRING PHYSICIAN: Vladimir Crofts, MD  CHIEF COMPLAINT:   Chief Complaint  Patient presents with   Foot Pain    HISTORY OF PRESENT ILLNESS:  [name] is a 67 y.o. African-American female with medical history significant for ESRD on HD on TTS, DM2, GERD, CAD, status post PCI and stent, diastolic dysfunction, depression, gout, hepatitis C and hypertension, who presented to the emergency room with acute onset of left heel and leg pain which has been intermittent over the last couple of months.  She noticed worsening redness of the heel with associated tenderness and pain and extension to her lower leg.  No fever or chills.  No chest pain or dyspnea or cough or wheezing.  No nausea or vomiting or abdominal pain.  She denied missing hemodialysis.  [place] ER and prescribed p.o. Bactrim and clindamycin for cellulitis of the left leg.  No cough or wheezing or dyspnea.  ED Course: When she came to the ER, BP was 159/61 with otherwise normal vital signs.  Labs revealed mild natremia and potassium 5.3, chloride 92 and glucose 292, BUN of 84 and creatinine 2.47 with calcium 8.4 anion gap of 16.  Lactic acid was 1.4 and procalcitonin 0.5.  CBC showed anemia slightly lower than previous levels with hemoglobin 7.9 hematocrit 26.8 with microcytosis.  Blood cultures were drawn.  Imaging: Chest x-Boothe showed mild left infrahilar right basilar linear atelectasis.  Left foot x-Capaldi showed mild to moderate severity plantar soft tissue swelling.  CT of the left lower extremity revealed the following: 1. Subcutaneous edema within the left hindfoot extending superiorly to the level of the mid calf. No discrete drainable subcutaneous fluid collection identified. No  infiltration of the intrafascial fat of the deep compartments of the left lower extremity. No subcutaneous gas identified. 2. Infiltration within the medial subcutaneous fat of the left thigh at the level of the distal diaphysis of the femur with associated coarse calcification likely postsurgical or posttraumatic in nature. 3. Mild-to-moderate degenerative arthritis within the left knee, most severe within the medial compartment. Small left knee effusion.  The patient was given a gram of IV vancomycin and Zosyn and 4 mg of IV morphine sulfate.  She will be admitted to a medical bed for further evaluation and management. PAST MEDICAL HISTORY:   Past Medical History:  Diagnosis Date   Anemia    Aortic stenosis    Echo 8/18: mean 13, peak 28, LVOT/AV mean velocity 0.51   Arthritis    Asthma    As a child    Bronchitis    CAD (coronary artery disease)    a. 09/2016: 50% Ost 1st Mrg stenosis, 50% 2nd Mrg stenosis, 20% Mid-Cx, 95% Prox LAD, 40% mid-LAD, and 10% dist-LAD stenosis. Staged PCI with DES to Prox-LAD.    Chronic combined systolic and diastolic CHF (congestive heart failure) ([place]) 2011   echo 2/18: EF 55-60, normal wall motion, grade 2 diastolic dysfunction, trivial AI // echo 3/18: Septal and apical HK, EF 45-50, normal wall motion, trivial AI, mild LAE, PASP 38 // echo 8/18: EF 60-65, normal wall motion, grade 1 diastolic dysfunction, calcified aortic valve leaflets, mild aortic stenosis (mean 13, peak 28, LVOT/AV mean velocity 0.51),  mild AI, moderate MAC, mild LAE, trivial TR    Chronic kidney disease    STAGE 4   Chronic kidney disease on chronic dialysis (HCC)    t, th, sat   Complication of anesthesia    Depression    Diabetes mellitus Dx 1989   Elevated lipids    GERD (gastroesophageal reflux disease)    Gout    Heart murmur    asymptomatic   Hepatitis C Dx 2013   Hypertension Dx 1989   Infected surgical wound    Lt arm   Myocardial infarction ([place]) 07/2015    Obesity    Pancreatitis 2013   Pneumonia    Refusal of blood transfusions as patient is Jehovah's Witness    pt states she is not [place] witness and does not refuse blood products   Tendinitis    Tremors of nervous system    LEFT HAND   Ulcer 2010    PAST SURGICAL HISTORY:   Past Surgical History:  Procedure Laterality Date   A/V FISTULAGRAM Left 04/11/2019   Procedure: A/V FISTULAGRAM;  Surgeon: Katha Cabal, MD;  Location: [place] [facility];  Service: Cardiovascular;  Laterality: Left;   A/V FISTULAGRAM Left 06/02/2019   Procedure: A/V FISTULAGRAM;  Surgeon: Katha Cabal, MD;  Location: [place] [facility];  Service: Cardiovascular;  Laterality: Left;   APPLICATION OF WOUND VAC Left 08/14/2017   Procedure: APPLICATION OF WOUND VAC Exchange;  Surgeon: Robert Bellow, MD;  Location: [facility];  Service: General;  Laterality: Left;   APPLICATION OF WOUND VAC Left 12/21/2018   Procedure: APPLICATION OF WOUND VAC;  Surgeon: Katha Cabal, MD;  Location: [facility];  Service: Vascular;  Laterality: Left;   AV FISTULA PLACEMENT Left 08/19/2018   Procedure: ARTERIOVENOUS (AV) FISTULA CREATION ( BRACHIOBASILIC );  Surgeon: Katha Cabal, MD;  Location: [facility];  Service: Vascular;  Laterality: Left;   BASCILIC VEIN TRANSPOSITION Left 11/18/2018   Procedure: BASCILIC VEIN TRANSPOSITION;  Surgeon: Katha Cabal, MD;  Location: [facility];  Service: Vascular;  Laterality: Left;   BIOPSY  09/20/2020   Procedure: BIOPSY;  Surgeon: Carol Ada, MD;  Location: [place];  Service: Endoscopy;;   CHOLECYSTECTOMY     COLONOSCOPY WITH PROPOFOL N/A 02/03/2018   Procedure: COLONOSCOPY WITH PROPOFOL;  Surgeon: Lin Landsman, MD;  Location: [facility];  Service: Gastroenterology;  Laterality: N/A;   CORONARY ANGIOPLASTY  07/2015   STENT   CORONARY STENT INTERVENTION N/A 09/18/2016   Procedure: Coronary Stent Intervention;  Surgeon: Troy Sine, MD;  Location: [place] [facility];  Service: Cardiovascular;  Laterality: N/A;   DIALYSIS/PERMA CATHETER INSERTION N/A 05/10/2018   Procedure: DIALYSIS/PERMA CATHETER INSERTION;  Surgeon: Katha Cabal, MD;  Location: [place] [facility];  Service: Cardiovascular;  Laterality: N/A;   DRESSING CHANGE UNDER ANESTHESIA Left 08/15/2017   Procedure: exploration of wound for bleeding;  Surgeon: Robert Bellow, MD;  Location: [facility];  Service: General;  Laterality: Left;   ESOPHAGOGASTRODUODENOSCOPY (EGD) WITH PROPOFOL N/A 02/03/2018   Procedure: ESOPHAGOGASTRODUODENOSCOPY (EGD) WITH PROPOFOL;  Surgeon: Lin Landsman, MD;  Location: [facility];  Service: Gastroenterology;  Laterality: N/A;   ESOPHAGOGASTRODUODENOSCOPY (EGD) WITH PROPOFOL N/A 09/20/2020   Procedure: ESOPHAGOGASTRODUODENOSCOPY (EGD) WITH PROPOFOL;  Surgeon: Carol Ada, MD;  Location: [place];  Service: Endoscopy;  Laterality: N/A;   EYE SURGERY  11/17/2018   INCISION AND DRAINAGE ABSCESS Left 08/12/2017   Procedure: INCISION AND DRAINAGE ABSCESS;  Surgeon: Robert Bellow, MD;  Location: [facility];  Service: General;  Laterality: Left;   KNEE ARTHROSCOPY     LEFT HEART CATH N/A 09/18/2016   Procedure: Left Heart Cath;  Surgeon: Troy Sine, MD;  Location: [place] [facility];  Service: Cardiovascular;  Laterality: N/A;   LEFT HEART CATH AND CORONARY ANGIOGRAPHY N/A 09/16/2016   Procedure: Left Heart Cath and Coronary Angiography;  Surgeon: Burnell Blanks, MD;  Location: [place] [facility];  Service: Cardiovascular;  Laterality: N/A;   LEFT HEART CATH AND CORONARY ANGIOGRAPHY N/A 04/29/2017   Procedure: LEFT HEART CATH AND CORONARY ANGIOGRAPHY;  Surgeon: Nelva Bush, MD;  Location: [place] [facility];  Service: Cardiovascular;  Laterality: N/A;   LOWER EXTREMITY ANGIOGRAPHY Right 03/08/2018   Procedure: LOWER EXTREMITY ANGIOGRAPHY;  Surgeon: Katha Cabal, MD;  Location: [place] [facility];  Service:  Cardiovascular;  Laterality: Right;   TUBAL LIGATION     TUBAL LIGATION     UPPER EXTREMITY ANGIOGRAPHY Right 09/19/2019   Procedure: UPPER EXTREMITY ANGIOGRAPHY;  Surgeon: Katha Cabal, MD;  Location: [place] [facility];  Service: Cardiovascular;  Laterality: Right;   WOUND DEBRIDEMENT Left 12/21/2018   Procedure: DEBRIDEMENT WOUND;  Surgeon: Katha Cabal, MD;  Location: [facility];  Service: Vascular;  Laterality: Left;   WOUND DEBRIDEMENT Left 12/30/2018   Procedure: DEBRIDEMENT WOUND WITH VAC PLACEMENT (LEFT UPPER EXTREMITY);  Surgeon: Katha Cabal, MD;  Location: [facility];  Service: Vascular;  Laterality: Left;    SOCIAL HISTORY:   Social History   Tobacco Use   Smoking status: Former    Packs/day: 0.25    Years: 6.00    Total pack years: 1.50    Types: Cigarettes    Quit date: 10/25/1980    Years since quitting: 41.7   Smokeless tobacco: Never  Substance Use Topics   Alcohol use: Not Currently    Comment: rare    FAMILY HISTORY:   Family History  Problem Relation Age of Onset   Colon cancer Mother    Heart attack Other    Heart attack Maternal Grandmother    Hypertension Sister    Hypertension Brother    Diabetes Paternal Grandmother    Breast cancer Neg Hx     DRUG ALLERGIES:   Allergies  Allergen Reactions   Morphine Itching    Tolerated hydromorphone on 07/2020 *will take along with Benadryl*   Shellfish Allergy Anaphylaxis and Swelling   Diazepam Other (See Comments)    "felt like out of body experience"    REVIEW OF SYSTEMS:   ROS As per history of present illness. All pertinent systems were reviewed above. Constitutional, HEENT, cardiovascular, respiratory, GI, GU, musculoskeletal, neuro, psychiatric, endocrine, integumentary and hematologic systems were reviewed and are otherwise negative/unremarkable except for positive findings mentioned above in the HPI.   MEDICATIONS AT HOME:   Prior to Admission medications   Medication Sig  Start Date End Date Taking? Authorizing Provider  albuterol (VENTOLIN HFA) 108 (90 Base) MCG/ACT inhaler Inhale 2 puffs into the lungs every 4 (four) hours as needed for wheezing or shortness of breath. 06/28/20   [name], PA-C  allopurinol (ZYLOPRIM) 100 MG tablet TAKE 1 TABLET BY MOUTH ONCE DAILY. Patient not taking: Reported on 12/20/2021 07/16/21   Birdie Sons, MD  aspirin 81 MG EC tablet Take 1 tablet (81 mg total) by mouth daily. 09/20/20   Thurnell Lose, MD  atorvastatin (LIPITOR) 80 MG tablet  Take 1 tablet (80 mg total) by mouth every evening. 04/24/21   Virginia Crews, MD  carvedilol (COREG) 12.5 MG tablet Take 1 tablet (12.5 mg total) by mouth 2 (two) times daily with a meal. 09/19/21   Paige, Weldon Picking, DO  Continuous Blood Gluc Receiver (FREESTYLE LIBRE 14 DAY READER) DEVI To check blood sugar ACHS 09/27/20   [name], PA-C  Continuous Blood Gluc Sensor (FREESTYLE LIBRE 14 DAY SENSOR) MISC To check blood sugar ACHS; change every 14 days 09/27/20   [name], PA-C  fluticasone [facility]) 50 MCG/ACT nasal spray Place 2 sprays into both nostrils daily as needed for allergies or rhinitis. 09/28/17   [name], PA-C  fluticasone furoate-vilanterol (BREO ELLIPTA) 200-25 MCG/INH AEPB Inhale 1 puff into the lungs daily. Patient not taking: Reported on 04/15/2022 08/30/20   [name], PA-C  Glucose Blood (BLOOD GLUCOSE TEST STRIPS 333) STRP Enough test stripes to test blood sugar TID x 30 days. No refills 04/16/22   Wyvonnia Dusky, MD  hydrOXYzine (ATARAX/VISTARIL) 25 MG tablet Take 1 tablet (25 mg total) by mouth 4 (four) times daily. Patient taking differently: Take 25 mg by mouth 3 (three) times daily as needed. 12/03/20   Bacigalupo, Dionne Bucy, MD  Insulin NPH, Human,, Isophane, (NOVOLIN N FLEXPEN) 100 UNIT/ML Kiwkpen Inject 100 Units into the skin every morning. 05/26/22 06/25/22  Blanchie Dessert, MD  Insulin Pen Needle 32G X  4 MM MISC Enough pen needles for 100 units of novolin QAM x 30 days. No refills 04/16/22   Wyvonnia Dusky, MD  methocarbamol (ROBAXIN) 500 MG tablet Take 1 tablet (500 mg total) by mouth 2 (two) times daily. 05/26/22   Blanchie Dessert, MD  midodrine (PROAMATINE) 10 MG tablet Take 10 mg by mouth daily. 12/23/21   [provider]  naloxone [facility]) nasal spray 4 mg/0.1 mL Place 1 spray into the nose as directed. 04/08/22   [provider]  nitroGLYCERIN (NITROSTAT) 0.4 MG SL tablet Place 1 tablet (0.4 mg total) under the tongue every 5 (five) minutes x 3 doses as needed for chest pain. 05/03/20   [name], PA-C  Oxycodone HCl 10 MG TABS Take 10 mg by mouth every 6 (six) hours. 04/01/22   [provider]  pregabalin (LYRICA) 25 MG capsule Take 25 mg by mouth 2 (two) times daily. 12/08/21   [provider]  valACYclovir (VALTREX) 500 MG tablet TAKE (1) TABLET BY MOUTH EVERY OTHER DAY. Patient taking differently: Take 500 mg by mouth every other day. 01/24/21   Bacigalupo, Dionne Bucy, MD  VELPHORO 500 MG chewable tablet Chew 1,500 mg by mouth 3 (three) times daily. 09/11/21   [provider]  gabapentin (NEURONTIN) 100 MG capsule Take 1 capsule (100 mg total) by mouth 3 (three) times daily. 08/19/17 02/03/19  [name], MD      VITAL SIGNS:  Blood pressure 132/70, pulse 69, temperature 98.5 F (36.9 C), temperature source Axillary, resp. rate 20, height _0  (1.727 m), weight 117.9 kg, SpO2 93 %.  PHYSICAL EXAMINATION:  Physical Exam  GENERAL:  67 y.o.-year-old African-American female patient lying in the bed with no acute distress.  EYES: Pupils equal, round, reactive to light and accommodation. No scleral icterus. Extraocular muscles intact.  HEENT: Head atraumatic, normocephalic. Oropharynx and nasopharynx clear.  NECK:  Supple, no jugular venous distention. No thyroid enlargement, no tenderness.  LUNGS: Normal breath sounds bilaterally, no  wheezing, rales,rhonchi or crepitation. No  use of accessory muscles of respiration.  CARDIOVASCULAR: Regular rate and rhythm, S1, S2 normal. No murmurs, rubs, or gallops.  ABDOMEN: Soft, nondistended, nontender. Bowel sounds present. No organomegaly or mass.  EXTREMITIES: Bilateral 1+ pitting lower extremity edema with no cyanosis, or clubbing.  NEUROLOGIC: Cranial nerves II through XII are intact. Muscle strength 5/5 in all extremities. Sensation intact. Gait not checked.  PSYCHIATRIC: The patient is alert and oriented x 3.  Normal affect and good eye contact. SKIN: Left heel erythema and swelling with extension to the lower leg with mild induration warmth and tenderness without fluctuation.  LABORATORY PANEL:   CBC Recent Labs  Lab 07/22/22 2213  WBC 5.2  HGB 7.9*  HCT 26.8*  PLT 188   ------------------------------------------------------------------------------------------------------------------  Chemistries  Recent Labs  Lab 07/22/22 2213  NA 134*  K 5.3*  CL 92*  CO2 26  GLUCOSE 292*  BUN 84*  CREATININE 12.47*  CALCIUM 8.4*   ------------------------------------------------------------------------------------------------------------------  Cardiac Enzymes No results for input(s): "TROPONINI" in the last 168 hours. ------------------------------------------------------------------------------------------------------------------  RADIOLOGY:  CT EXTREMITY LOWER LEFT W CONTRAST  Result Date: 07/23/2022 CLINICAL DATA:  Diabetes, infected left heel wound, ascending left lower extremity infection EXAM: CT OF THE LOWER LEFT EXTREMITY WITH CONTRAST TECHNIQUE: Multidetector CT imaging of the lower left extremity was performed according to the standard protocol following intravenous contrast administration. RADIATION DOSE REDUCTION: This exam was performed according to the departmental dose-optimization program which includes automated exposure control, adjustment of the mA  and/or kV according to patient size and/or use of iterative reconstruction technique. CONTRAST:  111m OMNIPAQUE IOHEXOL 350 MG/ML SOLN COMPARISON:  None Available. FINDINGS: Bones/Joint/Cartilage Normal alignment. No acute fracture or dislocation. No osseous erosions or abnormal periosteal reaction. Mild-to-moderate degenerative arthritis within the left knee, most severe within the medial compartment. Remaining joint spaces are preserved. Ligaments Suboptimally assessed by CT. Muscles and Tendons Intramuscular lipoma within the left adductor magnus muscle belly measuring 3.5 x 1.5 cm. Otherwise unremarkable. Soft tissues Visualized abdominopelvic contents are unremarkable. Probable 21 mm left Bartholin gland cyst. Mild nonspecific subcutaneous edema involving the anterolateral left thigh. Infiltration within the medial subcutaneous fat of the left thigh at the level of the distal diaphysis of the femur with associated coarse calcification likely postsurgical or posttraumatic in nature. There is subcutaneous edema within the left hindfoot extending superiorly to the level of the mid calf. No discrete drainable subcutaneous fluid collection identified. Inflammatory stranding appears confined to the subcutaneous compartment and there is no infiltration of the intrafascial fat of the deep compartments of the left lower extremity. No subcutaneous gas identified. Small left knee effusion. IMPRESSION: 1. Subcutaneous edema within the left hindfoot extending superiorly to the level of the mid calf. No discrete drainable subcutaneous fluid collection identified. No infiltration of the intrafascial fat of the deep compartments of the left lower extremity. No subcutaneous gas identified. 2. Infiltration within the medial subcutaneous fat of the left thigh at the level of the distal diaphysis of the femur with associated coarse calcification likely postsurgical or posttraumatic in nature. 3. Mild-to-moderate degenerative  arthritis within the left knee, most severe within the medial compartment. Small left knee effusion. Electronically Signed   By: AFidela SalisburyM.D.   On: 07/23/2022 02:31   DG Chest Port 1 View  Result Date: 07/22/2022 CLINICAL DATA:  Missed hemodialysis. EXAM: PORTABLE CHEST 1 VIEW COMPARISON:  May 26, 2022 FINDINGS: The heart size and mediastinal contours are within normal limits. Mild linear atelectasis is seen within  the left infrahilar region and lateral aspect of the right lung base. There is no evidence of an acute infiltrate, pleural effusion or pneumothorax. The visualized skeletal structures are unremarkable. IMPRESSION: Mild left infrahilar and right basilar linear atelectasis. Electronically Signed   By: Virgina Norfolk M.D.   On: 07/22/2022 23:35   DG Foot Complete Left  Result Date: 07/22/2022 CLINICAL DATA:  Left foot pain. EXAM: LEFT FOOT - COMPLETE 3+ VIEW COMPARISON:  None Available. FINDINGS: There is no evidence of an acute fracture or dislocation. There is no evidence of arthropathy or other focal bone abnormality. Mild to moderate severity plantar soft tissue swelling is seen. IMPRESSION: Mild to moderate severity plantar soft tissue swelling. Electronically Signed   By: Virgina Norfolk M.D.   On: 07/22/2022 23:34      IMPRESSION AND PLAN:  Assessment and Plan: * Cellulitis of left lower extremity - The patient will be admitted to a medical bed. - Will continue antibiotic therapy with IV Zosyn and vancomycin. - Pain management will be provided. - Warm compresses will be applied.   ESRD on hemodialysis [facility]) - Nephrology consult will be obtained. - Dr. Candiss Norse was notified about the patient.  Essential hypertension - We will continue her antihypertensives.  Type 2 diabetes mellitus with chronic kidney disease, with long-term current use of insulin ([place]) - The patient will be placed on supplemental coverage with NovoLog. - We will continue her basal  coverage.  Peripheral neuropathy - We will continue Lyrica.  GERD without esophagitis - We will continue PPI therapy.  Gout - We will continue colchicine.   DVT prophylaxis: Lovenox.  Advanced Care Planning:  Code Status: full code.  Family Communication:  The plan of care was discussed in details with the patient (and family). I answered all questions. The patient agreed to proceed with the above mentioned plan. Further management will depend upon hospital course. Disposition Plan: Back to previous home environment Consults called: Candiss Norse. All the records are reviewed and case discussed with ED provider.  Status is: Inpatient   At the time of the admission, it appears that the appropriate admission status for this patient is inpatient.  This is judged to be reasonable and necessary in order to provide the required intensity of service to ensure the patient's safety given the presenting symptoms, physical exam findings and initial radiographic and laboratory data in the context of comorbid conditions.  The patient requires inpatient status due to high intensity of service, high risk of further deterioration and high frequency of surveillance required.  I certify that at the time of admission, it is my clinical judgment that the patient will require inpatient hospital care extending more than 2 midnights.                            Dispo: The patient is from: Home              Anticipated d/c is to: Home              Patient currently is not medically stable to d/c.              Difficult to place patient: No  Christel Mormon M.D on 07/23/2022 at 5:38 AM  [facility]   From 7 PM-7 AM, contact night-coverage [email or web address]  CC: Primary care physician; Center, [facility]

## 2022-07-24 DIAGNOSIS — L03116 Cellulitis of left lower limb: Secondary | ICD-10-CM | POA: Diagnosis not present

## 2022-07-24 LAB — BPAM RBC
Blood Product Expiration Date: 202402142359
ISSUE DATE / TIME: 202401111648
Unit Type and Rh: 6200

## 2022-07-24 LAB — TYPE AND SCREEN
ABO/RH(D): A POS
Antibody Screen: NEGATIVE
Unit division: 0

## 2022-07-24 LAB — BASIC METABOLIC PANEL
Anion gap: 16 — ABNORMAL HIGH (ref 5–15)
BUN: 60 mg/dL — ABNORMAL HIGH (ref 8–23)
CO2: 23 mmol/L (ref 22–32)
Calcium: 8.5 mg/dL — ABNORMAL LOW (ref 8.9–10.3)
Chloride: 95 mmol/L — ABNORMAL LOW (ref 98–111)
Creatinine, Ser: 8.54 mg/dL — ABNORMAL HIGH (ref 0.44–1.00)
GFR, Estimated: 5 mL/min — ABNORMAL LOW (ref >60)
Glucose, Bld: 265 mg/dL — ABNORMAL HIGH (ref 70–99)
Potassium: 4.4 mmol/L (ref 3.5–5.1)
Sodium: 134 mmol/L — ABNORMAL LOW (ref 135–145)

## 2022-07-24 LAB — HIV ANTIBODY (ROUTINE TESTING W REFLEX): HIV Screen 4th Generation wRfx: NONREACTIVE

## 2022-07-24 LAB — OCCULT BLOOD X 1 CARD TO LAB, STOOL: Fecal Occult Bld: POSITIVE — AB

## 2022-07-24 LAB — CBC
HCT: 29.5 % — ABNORMAL LOW (ref 36.0–46.0)
Hemoglobin: 9.2 g/dL — ABNORMAL LOW (ref 12.0–15.0)
MCH: 24.8 pg — ABNORMAL LOW (ref 26.0–34.0)
MCHC: 31.2 g/dL (ref 30.0–36.0)
MCV: 79.5 fL — ABNORMAL LOW (ref 80.0–100.0)
Platelets: 231 10*3/uL (ref 150–400)
RBC: 3.71 MIL/uL — ABNORMAL LOW (ref 3.87–5.11)
RDW: 22.2 % — ABNORMAL HIGH (ref 11.5–15.5)
WBC: 7.1 10*3/uL (ref 4.0–10.5)
nRBC: 0 % (ref 0.0–0.2)

## 2022-07-24 LAB — GLUCOSE, CAPILLARY
Glucose-Capillary: 256 mg/dL — ABNORMAL HIGH (ref 70–99)
Glucose-Capillary: 356 mg/dL — ABNORMAL HIGH (ref 70–99)
Glucose-Capillary: 258 mg/dL — ABNORMAL HIGH (ref 70–99)
Glucose-Capillary: 286 mg/dL — ABNORMAL HIGH (ref 70–99)

## 2022-07-24 LAB — PHOSPHORUS: Phosphorus: 7.1 mg/dL — ABNORMAL HIGH (ref 2.5–4.6)

## 2022-07-24 LAB — MAGNESIUM: Magnesium: 2.1 mg/dL (ref 1.7–2.4)

## 2022-07-24 LAB — PROCALCITONIN: Procalcitonin: 0.44 ng/mL

## 2022-07-24 MED ORDER — PEG 3350-KCL-NA BICARB-NACL 420 G PO SOLR
4000.0000 mL | Freq: Once | ORAL | Status: AC
  Administered 2022-07-24: 4000 mL via ORAL
  Filled 2022-07-24: qty 4000

## 2022-07-24 MED ORDER — FOLIC ACID 1 MG PO TABS
1.0000 mg | ORAL_TABLET | Freq: Every day | ORAL | Status: DC
  Administered 2022-07-24 – 2022-07-26 (×2): 1 mg via ORAL
  Filled 2022-07-24 (×2): qty 1

## 2022-07-24 MED ORDER — CYANOCOBALAMIN 500 MCG PO TABS
500.0000 ug | ORAL_TABLET | Freq: Every day | ORAL | Status: DC
  Administered 2022-07-24 – 2022-07-26 (×2): 500 ug via ORAL
  Filled 2022-07-24 (×2): qty 1

## 2022-07-24 MED ORDER — INSULIN NPH (HUMAN) (ISOPHANE) 100 UNIT/ML ~~LOC~~ SUSP
90.0000 [IU] | Freq: Every day | SUBCUTANEOUS | Status: DC
  Filled 2022-07-24: qty 10

## 2022-07-24 MED ORDER — INSULIN NPH (HUMAN) (ISOPHANE) 100 UNIT/ML ~~LOC~~ SUSP
15.0000 [IU] | Freq: Two times a day (BID) | SUBCUTANEOUS | Status: DC
  Administered 2022-07-24 – 2022-07-26 (×4): 15 [IU] via SUBCUTANEOUS
  Filled 2022-07-24 (×2): qty 10

## 2022-07-24 NOTE — Progress Notes (Signed)
[facility] Progress Note  Patient: [name]    QVZ:[id number]  DOA: 07/23/2022     Date of Service: the patient was seen and examined on 07/24/2022  Chief Complaint  Patient presents with   Foot Pain   Brief hospital course: [name] is a 67 y.o. African-American female with medical history significant for ESRD on HD on TTS, DM2, GERD, CAD, status post PCI and stent, diastolic dysfunction, depression, gout, hepatitis C and hypertension, who presented to the emergency room with acute onset of left heel and leg pain which has been intermittent over the last couple of months.  She noticed worsening redness of the heel with associated tenderness and pain and extension to her lower leg. [place] and prescribed p.o. Bactrim and clindamycin for cellulitis of the left leg.   ED workup,   maging: Chest x-[place] showed mild left infrahilar right basilar linear atelectasis.  Left foot x-[place] showed mild to moderate severity plantar soft tissue swelling.  CT of the left lower extremity revealed the following: 1. Subcutaneous edema within the left hindfoot extending superiorly to the level of the mid calf. No discrete drainable subcutaneous fluid collection identified. No infiltration of the intrafascial fat of the deep compartments of the left lower extremity. No subcutaneous gas identified. 2. Infiltration within the medial subcutaneous fat of the left thigh at the level of the distal diaphysis of the femur with associated coarse calcification likely postsurgical or posttraumatic in nature. 3. Mild-to-moderate degenerative arthritis within the left knee, most severe within the medial compartment. Small left knee effusion.   The patient was given a gram of IV vancomycin and Zosyn and 4 mg of IV morphine sulfate.  She will be admitted to a medical bed for further evaluation and management.    Assessment and Plan:  # Cellulitis of left lower extremity, left ankle  joint and left heel blister -  continue antibiotic therapy with IV Zosyn and vancomycin. - Pain management will be provided. - Warm compresses will be applied.   # Gout flareup left ankle joint Uric acid 9.9 elevated S/p colchicine 0.3 mg given but discontinued as per pharmacy, contraindicated in a dialysis patient. Started Solu-Medrol 40 mg IV daily for 5 days She will benefit from allopurinol on the discharge, after resolution of acute gout attack   Anemia secondary to GI bleeding, FOBT positive.  Patient was complaining of dark stools Started pantoprazole 40 mg IV twice daily Hb 7.0, patient received 1 unit of transfusion on 1/11 Hb 9.2 posttransfusion Monitor H&H and transfuse if hemoglobin less than 7 GI consulted for further workup Started clear liquid diet kept n.p.o. after midnight for possible E/C by GI   Folic acid level 7.3, at lower end, started folic acid 1 mg p.o. daily follow with PCP to repeat folic acid level after 3 months. Vitamin B12 level 373, goal >400--500, started oral supplement.,  Continue for 3 months.   ESRD on hemodialysis [facility]) - Nephrology consulted for HD TTS schedule.   Essential hypertension - continue home antihypertensives. Monitor BP and titrate medications accordingly   Type 2 diabetes mellitus with chronic kidney disease, with long-term current use of insulin ([place]) - The patient will be placed on supplemental coverage with NovoLog. - We will continue her basal coverage.   Peripheral neuropathy - We will continue Lyrica.   GERD without esophagitis - continue PPI therapy.   Morbid obesity, Body mass index is 39.53 kg/m.  Interventions: Calorie  restricted diet and daily exercise advised to lose body weight.   Pressure Injury 07/24/22 Heel Left Stage 2 -  Partial thickness loss of dermis presenting as a shallow open injury with a red, pink wound bed without slough. serous blister left heel (Active)  07/24/22 0946  Location: Heel   Location Orientation: Left  Staging: Stage 2 -  Partial thickness loss of dermis presenting as a shallow open injury with a red, pink wound bed without slough.  Wound Description (Comments): serous blister left heel  Present on Admission: Yes  Dressing Type None 07/24/22 0743     Diet: Diabetic and renal diet DVT Prophylaxis: SCD, pharmacological prophylaxis contraindicated due to GI bleeding    Advance goals of care discussion: Full code  Family Communication: family was present at bedside, at the time of interview.  The pt provided permission to discuss medical plan with the family. Opportunity was given to ask question and all questions were answered satisfactorily.   Disposition:  Pt is from Home, admitted with left ankle pain, possible infection and gout, difficulty ambulation, still has significant pain and developed anemia with GI bleeding GI, which precludes a safe discharge. Discharge to home with home PT, when clinically stable, may need 1-2 more days to recover..  Subjective: No significant event overnight, patient feels improvement in the left lower extremity pain, patient was able to work with physical therapy.  No more GI bleeding. Denies any abdominal pain, no chest pain or palpitations, no any other active issues.   Physical Exam: General: NAD, lying comfortably Appear in no distress, affect appropriate Eyes: PERRLA ENT: Oral Mucosa Clear, moist  Neck: no JVD,  Cardiovascular: S1 and S2 Present, no Murmur,  Respiratory: good respiratory effort, Bilateral Air entry equal and Decreased, no Crackles, no wheezes Abdomen: Bowel Sound present, Soft and no tenderness,  Skin: no rashes Extremities: RLE wnl, LLE ankle swelling and tenderness, left heel blister Neurologic: without any new focal findings Gait not checked due to patient safety concerns  Vitals:   07/23/22 1928 07/23/22 2044 07/24/22 0543 07/24/22 0819  BP: (!) 138/93 (!) 146/84 (!) 142/72 (!) 110/45   Pulse: 73 70 70 63  Resp: '14 18 19 16  '$ Temp: 97.6 F (36.4 C) 97.9 F (36.6 C) 98.4 F (36.9 C) 98.2 F (36.8 C)  TempSrc: Oral     SpO2: 100% 99% 96% 98%  Weight:      Height:        Intake/Output Summary (Last 24 hours) at 07/24/2022 1503 Last data filed at 07/23/2022 1928 Gross per 24 hour  Intake 386.67 ml  Output 2500 ml  Net -2113.33 ml   Filed Weights   07/22/22 2211  Weight: 117.9 kg    Data Reviewed: I have personally reviewed and interpreted daily labs, tele strips, imagings as discussed above. I reviewed all nursing notes, pharmacy notes, vitals, pertinent old records I have discussed plan of care as described above with RN and patient/family.  CBC: Recent Labs  Lab 07/22/22 2213 07/23/22 0645 07/23/22 2245 07/24/22 0549  WBC 5.2 4.8  --  7.1  HGB 7.9* 7.0* 9.4* 9.2*  HCT 26.8* 23.6* 29.7* 29.5*  MCV 80.7 81.4  --  79.5*  PLT 188 184  --  409   Basic Metabolic Panel: Recent Labs  Lab 07/22/22 2213 07/23/22 0645 07/24/22 0549  NA 134* 132* 134*  K 5.3* 5.0 4.4  CL 92* 94* 95*  CO2 26 21* 23  GLUCOSE 292* 149* 265*  BUN 84* 84* 60*  CREATININE 12.47* 12.64* 8.54*  CALCIUM 8.4* 8.2* 8.5*  MG  --   --  2.1  PHOS  --   --  7.1*    Studies: No results found.  Scheduled Meds:  sodium chloride   Intravenous Once   aspirin EC  81 mg Oral Daily   atorvastatin  80 mg Oral QPM   bisacodyl  10 mg Oral QHS   carvedilol  12.5 mg Oral BID WC   Chlorhexidine Gluconate Cloth  6 each Topical Q0600   vitamin B-12  500 mcg Oral Daily   fluticasone furoate-vilanterol  1 puff Inhalation Daily   folic acid  1 mg Oral Daily   insulin aspart  0-5 Units Subcutaneous QHS   insulin aspart  0-9 Units Subcutaneous TID WC   insulin NPH Human  15 Units Subcutaneous BID AC & HS   methylPREDNISolone (SOLU-MEDROL) injection  40 mg Intravenous Q24H   midodrine  10 mg Oral Q lunch   naloxone  1 spray Nasal UD   oxyCODONE  10 mg Oral Q6H   pantoprazole (PROTONIX)  IV  40 mg Intravenous Q12H   polyethylene glycol  17 g Oral Daily   pregabalin  50 mg Oral BID   sucroferric oxyhydroxide  1,500 mg Oral TID   valACYclovir  500 mg Oral Daily   Continuous Infusions:  anticoagulant sodium citrate     piperacillin-tazobactam 2.25 g (07/24/22 1313)   vancomycin 1,000 mg (07/23/22 2223)   PRN Meds: acetaminophen **OR** acetaminophen, albuterol, alteplase, anticoagulant sodium citrate, fentaNYL (SUBLIMAZE) injection, fluticasone, heparin, hydrOXYzine, lidocaine (PF), lidocaine-prilocaine, methocarbamol, metoCLOPramide (REGLAN) injection, nitroGLYCERIN, pentafluoroprop-tetrafluoroeth, traZODone  Time spent: 50 minutes  Author: Val Riles. MD [facility] 07/24/2022 3:03 PM  To reach On-call, see care teams to locate the attending and reach out to them via [email or web address]. If 7PM-7AM, please contact night-coverage If you still have difficulty reaching the attending provider, please page the [facility] (Director on Call) for [facility] on amion for assistance.

## 2022-07-24 NOTE — Consult Note (Signed)
[facility] Nurse Consult Note: Reason for Consult: left heel blister Wound type: pressure injury Pressure Injury POA: Yes Measurement: see nursing flowsheet Wound bed: serous filled blister  Drainage (amount, consistency, odor) none Periwound: intact  Dressing procedure/placement/frequency: Offload heel at all times with Prevalon boot Silicone foam to the left heel for protection and insulation, change every 3 days    Re consult if needed, will not follow at this time. Thanks  Milah Recht [employer], RN,CWOCN, CNS, [place] [phone])

## 2022-07-24 NOTE — Evaluation (Signed)
Occupational Therapy Evaluation Patient Details Name: [name] MRN: [id number] DOB: April 24, 1956 Today's Date: 07/24/2022   History of Present Illness Pt. is a 67 y.o. female who was [facility] to [facility] with difficulty walking due to a left heel blister, cellulitis. Pt. PMHx includes: ESRD on HD, CAD, Hepatitis C, Neuropathy.   Clinical Impression   Pt. presents with weakness, fatigue, and limited functional mobility which limits the ability to complete basic ADL and IADL functioning. Pt. Resides at home with her son in a first floor apartment. Pt. was independent with ADLs, and IADL functioning: including meal preparation, and medication management. Pt. Was able to drive. Pt. Required minA for bed mobility with assist for the LLE,  to the EOB, and the RLE when returning to supine. Pt. Required MinA for transfers with the RW. Pt. Reports Right hip discomfort. Pt. Education was provided about positioning, and Pt. was assisted with LE positioning. Pt. required MaxA LE ADLs 2/2 to hip discomfort, limited reach, and fatigue. Pt. Education was provided about reacher use for ADLs. Pt. Will benefit from OT services for ADL training, A/E training, and pt. education about home modification, and DME. Pt. Will need follow-up OT services when returning home.      Recommendations for follow up therapy are one component of a multi-disciplinary discharge planning process, led by the attending physician.  Recommendations may be updated based on patient status, additional functional criteria and insurance authorization.   Follow Up Recommendations  Home health OT     Assistance Recommended at Discharge    Patient can return home with the following A lot of help with bathing/dressing/bathroom;Assistance with cooking/housework;A little help with walking and/or transfers    Functional Status Assessment  Patient has had a recent decline in their functional status and demonstrates the ability to make significant  improvements in function in a reasonable and predictable amount of time.  Equipment Recommendations  None recommended by OT    Recommendations for Other Services       Precautions / Restrictions Precautions Precautions: None Restrictions Weight Bearing Restrictions: No      Mobility Bed Mobility Overal bed mobility: Needs Assistance Bed Mobility: Supine to Sit, Sit to Supine     Supine to sit: Min assist Sit to supine: Min assist (RLE)        Transfers Overall transfer level: Needs assistance Equipment used: Rolling walker (2 wheels) Transfers: Sit to/from Stand Sit to Stand: Min guard                  Balance                                           ADL either performed or assessed with clinical judgement   ADL Overall ADL's : Needs assistance/impaired Eating/Feeding: Independent;Set up   Grooming: Independent;Set up           Upper Body Dressing : Independent   Lower Body Dressing: Maximal assistance                       Vision Baseline Vision/History: 1 Wears glasses Patient Visual Report: No change from baseline       Perception     Praxis      Pertinent Vitals/Pain Pain Assessment Pain Assessment: 0-10 Pain Location: right hip diccomfort with moving Pain Descriptors / Indicators: Discomfort (Right hip discomfort,  left foot "feels like a brick") Pain Intervention(s): Monitored during session, Repositioned     Hand Dominance Right   Extremity/Trunk Assessment Upper Extremity Assessment Upper Extremity Assessment: Overall WFL for tasks assessed           Communication Communication Communication: No difficulties   Cognition Arousal/Alertness: Awake/alert Behavior During Therapy: WFL for tasks assessed/performed Overall Cognitive Status: Within Functional Limits for tasks assessed                                       General Comments       Exercises     Shoulder  Instructions      Home Living Family/patient expects to be discharged to:: Private residence Living Arrangements: Alone Available Help at Discharge: Family;Available PRN/intermittently Type of Home: Apartment Home Access: Level entry     Home Layout: One level     Bathroom Shower/Tub: Tub/shower unit         Home Equipment: Cane - single point          Prior Functioning/Environment Prior Level of Function : Needs assist;History of Falls (last six months)       Physical Assist : ADLs (physical)     Mobility Comments: Uses a cane ADLs Comments: Pt. reports independence with ADLs/IADLs, driving        OT Problem List: Decreased strength;Decreased range of motion;Decreased knowledge of use of DME or AE;Pain;Decreased safety awareness      OT Treatment/Interventions: Self-care/ADL training;Therapeutic exercise;Patient/family education;Therapeutic activities;DME and/or AE instruction    OT Goals(Current goals can be found in the care plan section) Acute Rehab OT Goals Patient Stated Goal: To regain independence OT Goal Formulation: With patient Time For Goal Achievement: 08/14/22 Potential to Achieve Goals: Good  OT Frequency: Min 2X/week    Co-evaluation              AM-PAC OT "6 Clicks" Daily Activity     Outcome Measure Help from another person eating meals?: None Help from another person taking care of personal grooming?: A Little Help from another person toileting, which includes using toliet, bedpan, or urinal?: A Little Help from another person bathing (including washing, rinsing, drying)?: A Lot Help from another person to put on and taking off regular upper body clothing?: A Little Help from another person to put on and taking off regular lower body clothing?: A Lot 6 Click Score: 17   End of Session Equipment Utilized During Treatment: Gait belt  Activity Tolerance:   Patient left: in bed  OT Visit Diagnosis: Muscle weakness (generalized)  (M62.81)                Time: 3382-5053 OT Time Calculation (min): 32 min Charges:  OT General Charges $OT Visit: 1 Visit OT Evaluation $OT Eval Moderate Complexity: 1 Mod OT Treatments $Self Care/Home Management : 23-37 mins  Harrel Carina, MS, OTR/L   Harrel Carina 07/24/2022, 12:44 PM

## 2022-07-24 NOTE — Evaluation (Signed)
Physical Therapy Evaluation Patient Details Name: [name] MRN: [id number] DOB: [birth year] Today's Date: 07/24/2022  History of Present Illness  67 y/o female presented to ED on 07/22/22 for pain in L foot. Admitted for L LE cellulitis. PMH: ESRD on HD TTS, T2DM, HTN, CHF, CAD s/p PCI and stent, hepatitis C  Clinical Impression  Patient admitted with the above. PTA, patient lives alone and reports independence with use of cane but has recently had difficulty getting in/out of shower. Patient presents with weakness, impaired balance, decreased activity tolerance, and pain. Able to complete with minA for bed mobility and min guard for sit to stand transfer with RW. Ambulated 120' with RW and supervision. Complaining of L heel pain initially but with increasing distance, patient able to tolerate mobility better. Patient will benefit from skilled PT services during acute stay to address listed deficits. Recommend HHPT at discharge to maximize functional mobility and safety.      Recommendations for follow up therapy are one component of a multi-disciplinary discharge planning process, led by the attending physician.  Recommendations may be updated based on patient status, additional functional criteria and insurance authorization.  Follow Up Recommendations Home health PT      Assistance Recommended at Discharge Intermittent Supervision/Assistance  Patient can return home with the following  A little help with walking and/or transfers;A little help with bathing/dressing/bathroom;Assistance with cooking/housework;Assist for transportation    Equipment Recommendations Rolling [place] Krist (2 wheels)  Recommendations for Other Services       Functional Status Assessment Patient has had a recent decline in their functional status and demonstrates the ability to make significant improvements in function in a reasonable and predictable amount of time.     Precautions / Restrictions  Precautions Precautions: Fall Restrictions Weight Bearing Restrictions: No      Mobility  Bed Mobility Overal bed mobility: Needs Assistance Bed Mobility: Supine to Sit, Sit to Supine     Supine to sit: Min assist Sit to supine: Min assist        Transfers Overall transfer level: Needs assistance Equipment used: Rolling [place]era Stapleton (2 wheels) Transfers: Sit to/from Stand Sit to Stand: Min guard           General transfer comment: min guard for safety. Increased time to complete    Ambulation/Gait Ambulation/Gait assistance: Supervision Gait Distance (Feet): 120 Feet Assistive device: Rolling [place] Adelsberger (2 wheels) Gait Pattern/deviations: Step-through pattern, Decreased stride length Gait velocity: decreased     General Gait Details: supervision for safety. Improved mobility with increasing distance  Stairs            Wheelchair Mobility    Modified Rankin (Stroke Patients Only)       Balance Overall balance assessment: Needs assistance Sitting-balance support: No upper extremity supported, Feet supported Sitting balance-Leahy Scale: Good     Standing balance support: Bilateral upper extremity supported, Reliant on assistive device for balance Standing balance-Leahy Scale: Fair                               Pertinent Vitals/Pain Pain Assessment Pain Assessment: Faces Faces Pain Scale: Hurts even more Pain Location: R hip and L heel Pain Descriptors / Indicators: Discomfort, Grimacing Pain Intervention(s): Monitored during session    Home Living Family/patient expects to be discharged to:: Private residence Living Arrangements: Alone Available Help at Discharge: Family;Available PRN/intermittently Type of Home: Apartment Home Access: Level entry  Home Layout: One level Home Equipment: Cane - single point      Prior Function Prior Level of Function : Needs assist;History of Falls (last six months)       Physical Assist :  ADLs (physical)     Mobility Comments: Uses a cane ADLs Comments: Pt. reports independence with ADLs/IADLs, driving     Hand Dominance   Dominant Hand: Right    Extremity/Trunk Assessment   Upper Extremity Assessment Upper Extremity Assessment: Defer to OT evaluation    Lower Extremity Assessment Lower Extremity Assessment: Generalized weakness       Communication   Communication: No difficulties  Cognition Arousal/Alertness: Awake/alert Behavior During Therapy: WFL for tasks assessed/performed Overall Cognitive Status: Within Functional Limits for tasks assessed                                          General Comments      Exercises     Assessment/Plan    PT Assessment Patient needs continued PT services  PT Problem List Decreased strength;Decreased activity tolerance;Decreased balance;Decreased mobility;Decreased knowledge of use of DME       PT Treatment Interventions DME instruction;Gait training;Functional mobility training;Therapeutic activities;Therapeutic exercise;Balance training;Patient/family education    PT Goals (Current goals can be found in the Care Plan section)  Acute Rehab PT Goals Patient Stated Goal: to reduce pain PT Goal Formulation: With patient Time For Goal Achievement: 08/07/22 Potential to Achieve Goals: Good    Frequency Min 2X/week     Co-evaluation               AM-PAC PT "6 Clicks" Mobility  Outcome Measure Help needed turning from your back to your side while in a flat bed without using bedrails?: A Little Help needed moving from lying on your back to sitting on the side of a flat bed without using bedrails?: A Little Help needed moving to and from a bed to a chair (including a wheelchair)?: A Little Help needed standing up from a chair using your arms (e.g., wheelchair or bedside chair)?: A Little Help needed to walk in hospital room?: A Little Help needed climbing 3-5 steps with a railing? : A  Lot 6 Click Score: 17    End of Session   Activity Tolerance: Patient tolerated treatment well Patient left: in bed;with call bell/phone within reach;with bed alarm set Nurse Communication: Mobility status PT Visit Diagnosis: Unsteadiness on feet (R26.81);Muscle weakness (generalized) (M62.81);Other abnormalities of gait and mobility (R26.89)    Time: 4098-1191 PT Time Calculation (min) (ACUTE ONLY): 40 min   Charges:   PT Evaluation $PT Eval Low Complexity: 1 Low PT Treatments $Therapeutic Activity: 23-37 mins        Royelle Hinchman A. Gilford Rile PT, DPT [facility] - Acute Rehabilitation Services   Ysabela Keisler A Breklyn Fabrizio 07/24/2022, 12:54 PM

## 2022-07-24 NOTE — Discharge Planning (Signed)
[facility]  [place],  [place], [place] [address] [phone] F: [phone]  Scheduled days: Monday Wednesday and Friday  Treatment time: 7:30am  Above schedule confirmed with Tama Gander Secretary, Family transports

## 2022-07-24 NOTE — Progress Notes (Signed)
Procedural Consent signed and placed on chart. [place] started.

## 2022-07-24 NOTE — TOC Initial Note (Addendum)
Transition of Care [facility]) - Initial/Assessment Note    Patient Details  Name: [name] MRN: [id number] Date of Birth: 1956/06/04  Transition of Care [facility]) CM/SW Contact:    Quin Hoop, LCSW Phone Number: 07/24/2022, 10:36 AM  Clinical Narrative:                 Readmit assessment completed with patient via phone.  She states that she has a new address:  [address] [place], [place] [address].  Patient's PCP is [facility].  [facility] [place], [place].  Pt states that she lives by herself. She states that she has a cane at home.  If patient goes home at discharge, she states that her dtr will pick her up.  She has had [facility] before.     Expected Discharge Plan: [place] Barriers to Discharge: Continued Medical Work up   Patient Goals and CMS Choice Patient states their goals for this hospitalization and ongoing recovery are:: to return home          Expected Discharge Plan and Services:  Contiinued medical work up.       Living arrangements for the past 2 months: [place]                                      Prior Living Arrangements/Services Living arrangements for the past 2 months: [place] Lives with:: Self                   Activities of Daily Living Home Assistive Devices/Equipment: Cane (specify quad or straight) ADL Screening (condition at time of admission) Patient's cognitive ability adequate to safely complete daily activities?: No Is the patient deaf or have difficulty hearing?: No Does the patient have difficulty seeing, even when wearing glasses/contacts?: No Does the patient have difficulty concentrating, remembering, or making decisions?: Yes Patient able to express need for assistance with ADLs?: No Does the patient have difficulty dressing or bathing?: No Independently performs ADLs?: Yes (appropriate for developmental age) Does  the patient have difficulty walking or climbing stairs?: Yes Weakness of Legs: Both Weakness of Arms/Hands: Both  Permission Sought/Granted            Permission granted to share info w Relationship: Estil Daft, daughter,  [phone]     Emotional Assessment Appearance::  (unable to assess.  contact precautions) Attitude/Demeanor/Rapport: Engaged Affect (typically observed): Unable to Assess Orientation: : Oriented to Self, Oriented to Place, Oriented to  Time, Oriented to Situation Alcohol / Substance Use: Not Applicable Psych Involvement: No (comment)  Admission diagnosis:  ESRD (end stage renal disease) on dialysis ([place]) [N18.6, Z99.2] Cellulitis of left lower extremity [L03.116] Patient Active Problem List   Diagnosis Date Noted   Cellulitis of left lower extremity 07/23/2022   Type 2 diabetes mellitus with chronic kidney disease, with long-term current use of insulin ([place]) 07/23/2022   GERD without esophagitis 07/23/2022   Peripheral neuropathy 07/23/2022   GI bleed 09/17/2021   Severe sepsis ([place]) 04/21/2021   COVID-19 virus infection 04/20/2021   Upper GI bleed 09/17/2020   Prolonged QT interval 09/17/2020   COPD with acute bronchitis ([place]) 09/17/2020   Acute pulmonary edema ([place]) 09/14/2020   Acute respiratory failure with hypoxia ([place]) 09/13/2020   Allergy, unspecified, initial encounter 03/29/2020   Anaphylactic shock, unspecified, initial  encounter 03/29/2020   Volume overload 01/30/2020   Pain in left upper arm 01/18/2020   Cellulitis 01/16/2020   Anemia due to end stage renal disease ([place]) 01/16/2020   Diabetic polyneuropathy associated with type 2 diabetes mellitus ([place]) 11/06/2019   ESRD on hemodialysis ([place]) 11/06/2019   Nystagmus 11/06/2019   Intermittent claudication ([place]) 11/06/2019   Vertigo of central origin 11/06/2019   Weakness    Hypervolemia associated with renal insufficiency 10/31/2019   BMI 45.0-49.9, adult ([place]) 10/18/2019   Ascending  aortic aneurysm ([place]) 03/27/2019   Puncture wound without foreign body of left forearm, initial encounter 01/02/2019   Non-healing wound of upper extremity 12/28/2018   Unspecified open wound of left upper arm, initial encounter 29/93/7169   Complication from renal dialysis device 10/20/2018   Iron deficiency anemia, unspecified 10/18/2018   ESRD (end stage renal disease) ([place]) 06/01/2018   ARF (acute renal failure) ([place]) 05/09/2018   Colon cancer screening    LGSIL on Pap smear of cervix 01/27/2018   Gout 12/25/2017   AKI (acute kidney injury) ([place]) 12/24/2017   Hyperglycemia 12/24/2017   Acute on chronic diastolic CHF (congestive heart failure) ([place]) 10/03/2017   Chronic diastolic heart failure ([place]) 09/30/2017   Lymphedema 67/89/3810   Acute metabolic encephalopathy 17/51/0258   Aortic stenosis    Acute pain of both knees 02/12/2017   Chronic gout due to renal impairment of multiple sites without tophus 02/12/2017   Esophageal dysphagia 02/12/2017   Osteoarthritis 01/22/2017   Diabetic hyperosmolar non-ketotic state ([place]) 12/13/2016   CAD (coronary artery disease) 12/13/2016   Hyperlipidemia 12/13/2016   Hyperphosphatemia 52/77/8242   Acute diastolic CHF (congestive heart failure) (HCC)    Hyperkalemia 08/21/2016   Asthma 11/29/2015   Depression 09/05/2015   GERD (gastroesophageal reflux disease) 03/25/2015   Environmental allergies 03/14/2015   SOB (shortness of breath) 02/15/2015   Chronic hepatitis C without hepatic coma ([place]) 01/31/2015   Diabetic neuropathy ([place]) 01/31/2015   OSA (obstructive sleep apnea) 01/01/2015   Poor dentition 11/13/2014   Essential hypertension 10/08/2014   Morbid (severe) obesity due to excess calories ([place]) 10/08/2014   Shortness of breath    Localized, primary osteoarthritis 01/25/2014   Family history of diabetes mellitus 03/31/2013   Pain in limb 03/31/2013   Uncontrolled type 2 diabetes mellitus with hyperosmolar nonketotic  hyperglycemia ([place]) 03/25/2012    Class: Chronic   PCP:  [place], [place]:   [id number]: [phone]: [phone]    Social Determinants of Health (SDOH) Social History: SDOH Screenings   Food Insecurity: Food Insecurity Present (07/23/2022)  Housing: Low Risk  (07/23/2022)  Transportation Needs: No Transportation Needs (07/23/2022)  Utilities: Not At Risk (07/23/2022)  Alcohol Screen: Low Risk  (03/03/2021)  Depression (PHQ2-9): High Risk (03/03/2021)  Financial Resource Strain: Low Risk  (12/26/2019)  Physical Activity: Inactive (12/26/2019)  Social Connections: Socially Isolated (12/26/2019)  Stress: Stress Concern Present (12/26/2019)  Tobacco Use: Medium Risk (07/23/2022)   SDOH Interventions:     Readmission Risk Interventions    02/01/2020    3:17 PM  Readmission Risk Prevention Plan  Transportation Screening Complete  Medication Review ([place] Complete  PCP or Specialist appointment within 3-5 days of discharge Complete  HRI or [place]  SW Recovery Care/Counseling Consult Complete  [place]akeNot Applicable

## 2022-07-24 NOTE — Progress Notes (Addendum)
Inpatient Diabetes Program Recommendations  AACE/ADA: New Consensus Statement on Inpatient Glycemic Control (2015)  Target Ranges:  Prepandial:   less than 140 mg/dL      Peak postprandial:   less than 180 mg/dL (1-2 hours)      Critically ill patients:  140 - 180 mg/dL   Lab Results  Component Value Date   GLUCAP 356 (H) 07/24/2022   HGBA1C 8.8 (H) 07/23/2022    Review of Glycemic Control  Latest Reference Range & Units 07/23/22 20:55 07/24/22 08:08 07/24/22 12:30  Glucose-Capillary 70 - 99 mg/dL 245 (H) 256 (H) 356 (H)   Diabetes history: DM 2 Outpatient Diabetes medications:  Humulin NPH 100 units q AM (confirmed this with patient) Current orders for Inpatient glycemic control:  Novolog 0-9 units tid with meals and HS NPH 90 units daily (starts this morning)-has not been given yet by RN  Inpatient Diabetes Program Recommendations:   Spoke briefly with patient by phone.  She confirms that she was taking Humulin N 100 units q AM.   Recommend reducing NPH to 15 units bid while in the hospital (patient scheduled for NPO after MN tonight). Orders received from MD.   Thanks,  Adah Perl, RN, BC-ADM Inpatient Diabetes Coordinator Pager [phone]  (8a-5p)  [phone]- Called and spoke with patient regarding her insulin doses.  She states that she was having frequent lows at home so she stopped taking the insulin as ordered.  I explained that she likely needs reduction in her home insulin regimen as well.  She has re-located to [place], [place] and needs a new Endocrinologist.  I notified Dr. Dwyane Dee of patient's reports of lows.  We reviewed importance of identification and treatment of low blood sugars.  Patient was able to verbalize proper treatment of low blood sugars and reports "I know when its dropping".  Will also place further hypoglycemia instructions in D/C instructions.

## 2022-07-24 NOTE — Progress Notes (Signed)
Pharmacy Antibiotic Note  [name] is a 67 y.o. female w/ ESRD on TThSa HD, admitted on 07/23/2022 with cellulitis.  Pharmacy has been consulted for Vancomycin dosing.  -pt also on Zosyn, assess for narrowing abx  Plan: Vancomycin 1000 mg IV  qTThSa, following dialysis, for patient > 80 kg  Check Vancomycin level prior to 3rd HD. Target level 15 - 25 mcg/mL HD#1 post Vanc loading dose on Thur 07/23/22  Pharmacy will continue to follow and will adjust abx dosing as warranted.  Temp (24hrs), Avg:97.9 F (36.6 C), Min:97.5 F (36.4 C), Max:98.4 F (36.9 C)   Recent Labs  Lab 07/22/22 2213 07/23/22 0141 07/23/22 0645 07/24/22 0549  WBC 5.2  --  4.8 7.1  CREATININE 12.47*  --  12.64* 8.54*  LATICACIDVEN  --  1.4  --   --      Estimated Creatinine Clearance: 9 mL/min (A) (by C-G formula based on SCr of 8.54 mg/dL (H)).    Allergies  Allergen Reactions   Morphine Itching    Tolerated hydromorphone on 07/2020 *will take along with Benadryl*   Shellfish Allergy Anaphylaxis and Swelling   Diazepam Other (See Comments)    "felt like out of body experience"    Antimicrobials this admission: 1/11 Zosyn >> 1/11 Vancomycin >> PTA Med: Valtrex 500 mg  (daily dosing after HD recommended in HD pts)  Microbiology results: 1/11 BCx: NGx1d 1/11 MRSA PCR neg  Thank you for allowing pharmacy to be a part of this patient's care.  Chinita Greenland PharmD Clinical Pharmacist 07/24/2022

## 2022-07-24 NOTE — Discharge Planning (Signed)
[facility]  [place],  [place], [place] [address] [phone] F: [phone]  Scheduled days: Monday Wednesday and Friday  Treatment time: 7:30am  Above schedule confirmed with Tama Gander Secretary, Family transports

## 2022-07-24 NOTE — Consult Note (Signed)
Consultation  Referring Provider:    Hospitalist  Admit date: 07/23/2022 Consult date: 07/24/2022         Reason for Consultation:    Melena          HPI:   [name] is a 67 y.o. lady with ESRD on dialysis, obesity, DM II, HFpEF, cirrhosis, hepatitis c (on treatment?), and hypertension here with cellulitis. She complained about dark stool so GI was consulted. She endorses dark black stool on going for months. She had unremarkable EGD/Colonoscopy in 2019. She denies any NSAIDS. No blood thinners. Mother had colon cancer in her 28's. No iron supplementation but was started on it in the hospital. No abdominal pain. Feels better after antibiotics for her cellulitis. She had a recent history of a colostomy for necrotizing fascitis which was reversed last year. Had an outpatient CT scan that showed sigmoid thickening.  Past Medical History:  Diagnosis Date   Anemia    Aortic stenosis    Echo 8/18: mean 13, peak 28, LVOT/AV mean velocity 0.51   Arthritis    Asthma    As a child    Bronchitis    CAD (coronary artery disease)    a. 09/2016: 50% Ost 1st Mrg stenosis, 50% 2nd Mrg stenosis, 20% Mid-Cx, 95% Prox LAD, 40% mid-LAD, and 10% dist-LAD stenosis. Staged PCI with DES to Prox-LAD.    Chronic combined systolic and diastolic CHF (congestive heart failure) ([place]) 2011   echo 2/18: EF 55-60, normal wall motion, grade 2 diastolic dysfunction, trivial AI // echo 3/18: Septal and apical HK, EF 45-50, normal wall motion, trivial AI, mild LAE, PASP 38 // echo 8/18: EF 60-65, normal wall motion, grade 1 diastolic dysfunction, calcified aortic valve leaflets, mild aortic stenosis (mean 13, peak 28, LVOT/AV mean velocity 0.51), mild AI, moderate MAC, mild LAE, trivial TR    Chronic kidney disease    STAGE 4   Chronic kidney disease on chronic dialysis (HCC)    t, th, sat   Complication of anesthesia    Depression    Diabetes mellitus Dx 1989   Elevated lipids    GERD (gastroesophageal reflux  disease)    Gout    Heart murmur    asymptomatic   Hepatitis C Dx 2013   Hypertension Dx 1989   Infected surgical wound    Lt arm   Myocardial infarction ([place]) 07/2015   Obesity    Pancreatitis 2013   Pneumonia    Refusal of blood transfusions as patient is Jehovah's Witness    pt states she is not [place] witness and does not refuse blood products   Tendinitis    Tremors of nervous system    LEFT HAND   Ulcer 2010    Past Surgical History:  Procedure Laterality Date   A/V FISTULAGRAM Left 04/11/2019   Procedure: A/V FISTULAGRAM;  Surgeon: Katha Cabal, MD;  Location: [place] [facility];  Service: Cardiovascular;  Laterality: Left;   A/V FISTULAGRAM Left 06/02/2019   Procedure: A/V FISTULAGRAM;  Surgeon: Katha Cabal, MD;  Location: [place] [facility];  Service: Cardiovascular;  Laterality: Left;   APPLICATION OF WOUND VAC Left 08/14/2017   Procedure: APPLICATION OF WOUND VAC Exchange;  Surgeon: Robert Bellow, MD;  Location: [facility];  Service: General;  Laterality: Left;   APPLICATION OF WOUND VAC Left 12/21/2018   Procedure: APPLICATION OF WOUND VAC;  Surgeon: Katha Cabal, MD;  Location: [facility];  Service: Vascular;  Laterality: Left;  AV FISTULA PLACEMENT Left 08/19/2018   Procedure: ARTERIOVENOUS (AV) FISTULA CREATION ( BRACHIOBASILIC );  Surgeon: Katha Cabal, MD;  Location: [facility];  Service: Vascular;  Laterality: Left;   BASCILIC VEIN TRANSPOSITION Left 11/18/2018   Procedure: BASCILIC VEIN TRANSPOSITION;  Surgeon: Katha Cabal, MD;  Location: [facility];  Service: Vascular;  Laterality: Left;   BIOPSY  09/20/2020   Procedure: BIOPSY;  Surgeon: Carol Ada, MD;  Location: [place];  Service: Endoscopy;;   CHOLECYSTECTOMY     COLONOSCOPY WITH PROPOFOL N/A 02/03/2018   Procedure: COLONOSCOPY WITH PROPOFOL;  Surgeon: Lin Landsman, MD;  Location: [facility];  Service: Gastroenterology;  Laterality: N/A;   CORONARY ANGIOPLASTY   07/2015   STENT   CORONARY STENT INTERVENTION N/A 09/18/2016   Procedure: Coronary Stent Intervention;  Surgeon: Troy Sine, MD;  Location: [place] [facility];  Service: Cardiovascular;  Laterality: N/A;   DIALYSIS/PERMA CATHETER INSERTION N/A 05/10/2018   Procedure: DIALYSIS/PERMA CATHETER INSERTION;  Surgeon: Katha Cabal, MD;  Location: [place] [facility];  Service: Cardiovascular;  Laterality: N/A;   DRESSING CHANGE UNDER ANESTHESIA Left 08/15/2017   Procedure: exploration of wound for bleeding;  Surgeon: Robert Bellow, MD;  Location: [facility];  Service: General;  Laterality: Left;   ESOPHAGOGASTRODUODENOSCOPY (EGD) WITH PROPOFOL N/A 02/03/2018   Procedure: ESOPHAGOGASTRODUODENOSCOPY (EGD) WITH PROPOFOL;  Surgeon: Lin Landsman, MD;  Location: [facility];  Service: Gastroenterology;  Laterality: N/A;   ESOPHAGOGASTRODUODENOSCOPY (EGD) WITH PROPOFOL N/A 09/20/2020   Procedure: ESOPHAGOGASTRODUODENOSCOPY (EGD) WITH PROPOFOL;  Surgeon: Carol Ada, MD;  Location: [place];  Service: Endoscopy;  Laterality: N/A;   EYE SURGERY  11/17/2018   INCISION AND DRAINAGE ABSCESS Left 08/12/2017   Procedure: INCISION AND DRAINAGE ABSCESS;  Surgeon: Robert Bellow, MD;  Location: [facility];  Service: General;  Laterality: Left;   KNEE ARTHROSCOPY     LEFT HEART CATH N/A 09/18/2016   Procedure: Left Heart Cath;  Surgeon: Troy Sine, MD;  Location: [place] [facility];  Service: Cardiovascular;  Laterality: N/A;   LEFT HEART CATH AND CORONARY ANGIOGRAPHY N/A 09/16/2016   Procedure: Left Heart Cath and Coronary Angiography;  Surgeon: Burnell Blanks, MD;  Location: [place] [facility];  Service: Cardiovascular;  Laterality: N/A;   LEFT HEART CATH AND CORONARY ANGIOGRAPHY N/A 04/29/2017   Procedure: LEFT HEART CATH AND CORONARY ANGIOGRAPHY;  Surgeon: Nelva Bush, MD;  Location: [place] [facility];  Service: Cardiovascular;  Laterality: N/A;   LOWER EXTREMITY ANGIOGRAPHY  Right 03/08/2018   Procedure: LOWER EXTREMITY ANGIOGRAPHY;  Surgeon: Katha Cabal, MD;  Location: [place] [facility];  Service: Cardiovascular;  Laterality: Right;   TUBAL LIGATION     TUBAL LIGATION     UPPER EXTREMITY ANGIOGRAPHY Right 09/19/2019   Procedure: UPPER EXTREMITY ANGIOGRAPHY;  Surgeon: Katha Cabal, MD;  Location: [place] [facility];  Service: Cardiovascular;  Laterality: Right;   WOUND DEBRIDEMENT Left 12/21/2018   Procedure: DEBRIDEMENT WOUND;  Surgeon: Katha Cabal, MD;  Location: [facility];  Service: Vascular;  Laterality: Left;   WOUND DEBRIDEMENT Left 12/30/2018   Procedure: DEBRIDEMENT WOUND WITH VAC PLACEMENT (LEFT UPPER EXTREMITY);  Surgeon: Katha Cabal, MD;  Location: [facility];  Service: Vascular;  Laterality: Left;    Family History  Problem Relation Age of Onset   Colon cancer Mother    Heart attack Other    Heart attack Maternal Grandmother    Hypertension Sister    Hypertension  Brother    Diabetes Paternal Grandmother    Breast cancer Neg Hx     Social History   Tobacco Use   Smoking status: Former    Packs/day: 0.25    Years: 6.00    Total pack years: 1.50    Types: Cigarettes    Quit date: 10/25/1980    Years since quitting: 41.7   Smokeless tobacco: Never  Vaping Use   Vaping Use: Never used  Substance Use Topics   Alcohol use: Not Currently    Comment: rare   Drug use: Not Currently    Types: Marijuana    Prior to Admission medications   Medication Sig Start Date End Date Taking? Authorizing Provider  albuterol (VENTOLIN HFA) 108 (90 Base) MCG/ACT inhaler Inhale 2 puffs into the lungs every 4 (four) hours as needed for wheezing or shortness of breath. 06/28/20  Yes [name], PA-C  allopurinol (ZYLOPRIM) 100 MG tablet TAKE 1 TABLET BY MOUTH ONCE DAILY. 07/16/21  Yes Birdie Sons, MD  aspirin 81 MG EC tablet Take 1 tablet (81 mg total) by mouth daily. 09/20/20  Yes Thurnell Lose, MD  atorvastatin  (LIPITOR) 80 MG tablet Take 1 tablet (80 mg total) by mouth every evening. 04/24/21  Yes Bacigalupo, Dionne Bucy, MD  carvedilol (COREG) 12.5 MG tablet Take 1 tablet (12.5 mg total) by mouth 2 (two) times daily with a meal. 09/19/21  Yes Paige, Victoria J, DO  fluticasone (FLONASE) 50 MCG/ACT nasal spray Place 2 sprays into both nostrils daily as needed for allergies or rhinitis. 09/28/17  Yes Burnette, Clearnce Sorrel, PA-C  HUMULIN 70/30 KWIKPEN (70-30) 100 UNIT/ML KwikPen Inject 30 Units into the skin 2 (two) times daily. 05/26/22  Yes [provider]  hydrOXYzine (ATARAX/VISTARIL) 25 MG tablet Take 1 tablet (25 mg total) by mouth 4 (four) times daily. Patient taking differently: Take 25 mg by mouth 3 (three) times daily as needed. 12/03/20  Yes Bacigalupo, Dionne Bucy, MD  midodrine (PROAMATINE) 10 MG tablet Take 10 mg by mouth daily. 12/23/21  Yes [provider]  naloxone (NARCAN) nasal spray 4 mg/0.1 mL Place 1 spray into the nose as directed. 04/08/22  Yes [provider]  nitroGLYCERIN (NITROSTAT) 0.4 MG SL tablet Place 1 tablet (0.4 mg total) under the tongue every 5 (five) minutes x 3 doses as needed for chest pain. 05/03/20  Yes [name], PA-C  Oxycodone HCl 10 MG TABS Take 10 mg by mouth every 6 (six) hours. 04/01/22  Yes [provider]  pregabalin (LYRICA) 50 MG capsule Take 50 mg by mouth 2 (two) times daily. 06/12/22  Yes [provider]  valACYclovir (VALTREX) 500 MG tablet TAKE (1) TABLET BY MOUTH EVERY OTHER DAY. Patient taking differently: Take 500 mg by mouth every other day. 01/24/21  Yes Bacigalupo, Dionne Bucy, MD  VELPHORO 500 MG chewable tablet Chew 1,500 mg by mouth 3 (three) times daily. 09/11/21  Yes [provider]  Continuous Blood Gluc Receiver (FREESTYLE LIBRE 14 DAY READER) DEVI To check blood sugar ACHS 09/27/20   [name], PA-C  Continuous Blood Gluc Sensor (FREESTYLE LIBRE 14 DAY SENSOR) MISC To check blood  sugar ACHS; change every 14 days 09/27/20   [name], PA-C  fluticasone furoate-vilanterol (BREO ELLIPTA) 200-25 MCG/INH AEPB Inhale 1 puff into the lungs daily. Patient not taking: Reported on 04/15/2022 08/30/20   [name], PA-C  Glucose Blood (BLOOD GLUCOSE TEST STRIPS 333) STRP Enough test  stripes to test blood sugar TID x 30 days. No refills 04/16/22   Wyvonnia Dusky, MD  Insulin NPH, Human,, Isophane, (NOVOLIN N FLEXPEN) 100 UNIT/ML Kiwkpen Inject 100 Units into the skin every morning. 05/26/22 06/25/22  Blanchie Dessert, MD  Insulin Pen Needle 32G X 4 MM MISC Enough pen needles for 100 units of novolin QAM x 30 days. No refills 04/16/22   Wyvonnia Dusky, MD  LEVEMIR FLEXPEN 100 UNIT/ML FlexPen Inject 60 Units into the skin daily. Patient not taking: Reported on 07/23/2022    [provider]  methocarbamol (ROBAXIN) 500 MG tablet Take 1 tablet (500 mg total) by mouth 2 (two) times daily. Patient not taking: Reported on 07/23/2022 05/26/22   Blanchie Dessert, MD  pregabalin (LYRICA) 25 MG capsule Take 25 mg by mouth 2 (two) times daily. Patient not taking: Reported on 07/23/2022 12/08/21   [provider]  gabapentin (NEURONTIN) 100 MG capsule Take 1 capsule (100 mg total) by mouth 3 (three) times daily. 08/19/17 02/03/19  [name], MD    Current Facility-Administered Medications  Medication Dose Route Frequency Provider Last Rate Last Admin   0.9 %  sodium chloride infusion (Manually program via Guardrails IV Fluids)   Intravenous Once Val Riles, MD   Held at 07/23/22 1250   acetaminophen (TYLENOL) tablet 650 mg  650 mg Oral Q6H PRN Mansy, Jan A, MD       Or   acetaminophen (TYLENOL) suppository 650 mg  650 mg Rectal Q6H PRN Mansy, Jan A, MD       albuterol (PROVENTIL) (2.5 MG/3ML) 0.083% nebulizer solution 3 mL  3 mL Inhalation Q4H PRN Mansy, Jan A, MD   3 mL at 07/23/22 1250   alteplase (CATHFLO ACTIVASE) injection 2 mg  2 mg  Intracatheter Once PRN Colon Flattery, NP       anticoagulant sodium citrate solution 5 mL  5 mL Intracatheter PRN Colon Flattery, NP       aspirin EC tablet 81 mg  81 mg Oral Daily Mansy, Jan A, MD   81 mg at 07/24/22 0924   atorvastatin (LIPITOR) tablet 80 mg  80 mg Oral QPM Mansy, Jan A, MD   80 mg at 07/24/22 1628   bisacodyl (DULCOLAX) EC tablet 10 mg  10 mg Oral QHS Val Riles, MD   10 mg at 07/23/22 2213   carvedilol (COREG) tablet 12.5 mg  12.5 mg Oral BID WC Mansy, Jan A, MD   12.5 mg at 07/24/22 1628   Chlorhexidine Gluconate Cloth 2 % PADS 6 each  6 each Topical Q0600 Colon Flattery, NP   6 each at 07/24/22 8032   cyanocobalamin (VITAMIN B12) tablet 500 mcg  500 mcg Oral Daily Val Riles, MD   500 mcg at 07/24/22 0923   fentaNYL (SUBLIMAZE) injection 25 mcg  25 mcg Intravenous Q4H PRN Mansy, Jan A, MD       fluticasone (FLONASE) 50 MCG/ACT nasal spray 2 spray  2 spray Each Nare Daily PRN Mansy, Jan A, MD       fluticasone furoate-vilanterol (BREO ELLIPTA) 200-25 MCG/ACT 1 puff  1 puff Inhalation Daily Mansy, Jan A, MD   1 puff at 07/05/81 5003   folic acid (FOLVITE) tablet 1 mg  1 mg Oral Daily Val Riles, MD   1 mg at 07/24/22 0924   heparin injection 1,000 Units  1,000 Units Intracatheter PRN Colon Flattery, NP       hydrOXYzine (ATARAX) tablet 25 mg  25  mg Oral TID PRN Mansy, Jan A, MD       insulin aspart (novoLOG) injection 0-5 Units  0-5 Units Subcutaneous QHS Mansy, Jan A, MD   2 Units at 07/23/22 2214   insulin aspart (novoLOG) injection 0-9 Units  0-9 Units Subcutaneous TID WC Mansy, Jan A, MD   5 Units at 07/24/22 1629   insulin NPH Human (NOVOLIN N) injection 15 Units  15 Units Subcutaneous BID AC & HS Val Riles, MD   15 Units at 07/24/22 1252   lidocaine (PF) (XYLOCAINE) 1 % injection 5 mL  5 mL Intradermal PRN Colon Flattery, NP       lidocaine-prilocaine (EMLA) cream 1 Application  1 Application Topical PRN Colon Flattery, NP        methocarbamol (ROBAXIN) tablet 500 mg  500 mg Oral BID PRN Mansy, Jan A, MD       methylPREDNISolone sodium succinate (SOLU-MEDROL) 40 mg/mL injection 40 mg  40 mg Intravenous Q24H Val Riles, MD   40 mg at 07/24/22 1256   metoCLOPramide (REGLAN) injection 10 mg  10 mg Intravenous Q6H PRN Mansy, Jan A, MD       midodrine (PROAMATINE) tablet 10 mg  10 mg Oral Q lunch Mansy, Jan A, MD   10 mg at 07/24/22 1253   naloxone (NARCAN) nasal spray 4 mg/0.1 mL  1 spray Nasal UD Mansy, Jan A, MD       nitroGLYCERIN (NITROSTAT) SL tablet 0.4 mg  0.4 mg Sublingual Q5 Min x 3 PRN Mansy, Jan A, MD       oxyCODONE (Oxy IR/ROXICODONE) immediate release tablet 10 mg  10 mg Oral Q6H Mansy, Jan A, MD   10 mg at 07/24/22 1628   pantoprazole (PROTONIX) injection 40 mg  40 mg Intravenous Q12H Val Riles, MD   40 mg at 07/24/22 2458   pentafluoroprop-tetrafluoroeth (GEBAUERS) aerosol 1 Application  1 Application Topical PRN Colon Flattery, NP       piperacillin-tazobactam (ZOSYN) IVPB 2.25 g  2.25 g Intravenous Q8H Mansy, Jan A, MD 100 mL/hr at 07/24/22 1502 Infusion Verify at 07/24/22 1502   polyethylene glycol (MIRALAX / GLYCOLAX) packet 17 g  17 g Oral Daily Val Riles, MD       polyethylene glycol-electrolytes (NuLYTELY) solution 4,000 mL  4,000 mL Oral Once Lesly Rubenstein, MD       pregabalin (LYRICA) capsule 50 mg  50 mg Oral BID Mansy, Jan A, MD   50 mg at 07/24/22 0998   sucroferric oxyhydroxide (VELPHORO) chewable tablet 1,500 mg  1,500 mg Oral TID Mansy, Jan A, MD   1,500 mg at 07/24/22 1628   traZODone (DESYREL) tablet 25 mg  25 mg Oral QHS PRN Mansy, Jan A, MD       valACYclovir (VALTREX) tablet 500 mg  500 mg Oral Daily Mansy, Jan A, MD   500 mg at 07/24/22 3382   vancomycin (VANCOCIN) IVPB 1000 mg/200 mL premix  1,000 mg Intravenous Q T,Th,Sa-HD Wynelle Cleveland, RPH 200 mL/hr at 07/24/22 1502 Infusion Verify at 07/24/22 1502    Allergies as of 07/22/2022 - Review Complete 07/22/2022   Allergen Reaction Noted   Morphine Itching 05/14/2012   Shellfish allergy Anaphylaxis and Swelling 05/14/2012   Diazepam Other (See Comments) 10/07/2015     Review of Systems:    All systems reviewed and negative except where noted in HPI.  Review of Systems  Constitutional:  Negative for chills and fever.  Respiratory:  Negative for  shortness of breath.   Cardiovascular:  Negative for chest pain.  Gastrointestinal:  Positive for melena and nausea. Negative for abdominal pain, blood in stool, constipation, diarrhea and vomiting.  Musculoskeletal:  Positive for joint pain.  Neurological:  Negative for focal weakness.  Psychiatric/Behavioral:  Negative for substance abuse.   All other systems reviewed and are negative.      Physical Exam:  Vital signs in last 24 hours: Temp:  [97.6 F (36.4 C)-98.4 F (36.9 C)] 98.1 F (36.7 C) (01/12 1603) Pulse Rate:  [58-73] 60 (01/12 1603) Resp:  [12-19] 18 (01/12 1603) BP: (110-152)/(45-93) 141/57 (01/12 1603) SpO2:  [94 %-100 %] 97 % (01/12 1603) Last BM Date : 07/24/21 General:   In  NAD Head:  Normocephalic and atraumatic. Eyes:   No icterus.   Conjunctiva pink. Mouth: Mucosa pink moist, no lesions. Neck:  Supple; no masses felt Lungs:  No respiratory distress Abdomen:   Flat, soft, nondistended, nontender Msk:  MAEW x4, No clubbing or cyanosis. Strength 5/5. Symmetrical without gross deformities. Neurologic:  Alert and  oriented x4;  Cranial nerves II-XII intact.  Skin:  Warm, dry, pink without significant lesions or rashes. Psych:  Alert and cooperative. Normal affect.  LAB RESULTS: Recent Labs    07/22/22 2213 07/23/22 0645 07/23/22 2245 07/24/22 0549  WBC 5.2 4.8  --  7.1  HGB 7.9* 7.0* 9.4* 9.2*  HCT 26.8* 23.6* 29.7* 29.5*  PLT 188 184  --  231   BMET Recent Labs    07/22/22 2213 07/23/22 0645 07/24/22 0549  NA 134* 132* 134*  K 5.3* 5.0 4.4  CL 92* 94* 95*  CO2 26 21* 23  GLUCOSE 292* 149* 265*  BUN  84* 84* 60*  CREATININE 12.47* 12.64* 8.54*  CALCIUM 8.4* 8.2* 8.5*   LFT No results for input(s): "PROT", "ALBUMIN", "AST", "ALT", "ALKPHOS", "BILITOT", "BILIDIR", "IBILI" in the last 72 hours. PT/INR No results for input(s): "LABPROT", "INR" in the last 72 hours.  STUDIES: CT EXTREMITY LOWER LEFT W CONTRAST  Result Date: 07/23/2022 CLINICAL DATA:  Diabetes, infected left heel wound, ascending left lower extremity infection EXAM: CT OF THE LOWER LEFT EXTREMITY WITH CONTRAST TECHNIQUE: Multidetector CT imaging of the lower left extremity was performed according to the standard protocol following intravenous contrast administration. RADIATION DOSE REDUCTION: This exam was performed according to the departmental dose-optimization program which includes automated exposure control, adjustment of the mA and/or kV according to patient size and/or use of iterative reconstruction technique. CONTRAST:  120m OMNIPAQUE IOHEXOL 350 MG/ML SOLN COMPARISON:  None Available. FINDINGS: Bones/Joint/Cartilage Normal alignment. No acute fracture or dislocation. No osseous erosions or abnormal periosteal reaction. Mild-to-moderate degenerative arthritis within the left knee, most severe within the medial compartment. Remaining joint spaces are preserved. Ligaments Suboptimally assessed by CT. Muscles and Tendons Intramuscular lipoma within the left adductor magnus muscle belly measuring 3.5 x 1.5 cm. Otherwise unremarkable. Soft tissues Visualized abdominopelvic contents are unremarkable. Probable 21 mm left Bartholin gland cyst. Mild nonspecific subcutaneous edema involving the anterolateral left thigh. Infiltration within the medial subcutaneous fat of the left thigh at the level of the distal diaphysis of the femur with associated coarse calcification likely postsurgical or posttraumatic in nature. There is subcutaneous edema within the left hindfoot extending superiorly to the level of the mid calf. No discrete  drainable subcutaneous fluid collection identified. Inflammatory stranding appears confined to the subcutaneous compartment and there is no infiltration of the intrafascial fat of the deep compartments of the left  lower extremity. No subcutaneous gas identified. Small left knee effusion. IMPRESSION: 1. Subcutaneous edema within the left hindfoot extending superiorly to the level of the mid calf. No discrete drainable subcutaneous fluid collection identified. No infiltration of the intrafascial fat of the deep compartments of the left lower extremity. No subcutaneous gas identified. 2. Infiltration within the medial subcutaneous fat of the left thigh at the level of the distal diaphysis of the femur with associated coarse calcification likely postsurgical or posttraumatic in nature. 3. Mild-to-moderate degenerative arthritis within the left knee, most severe within the medial compartment. Small left knee effusion. Electronically Signed   By: Fidela Salisbury M.D.   On: 07/23/2022 02:31   DG Chest Port 1 View  Result Date: 07/22/2022 CLINICAL DATA:  Missed hemodialysis. EXAM: PORTABLE CHEST 1 VIEW COMPARISON:  May 26, 2022 FINDINGS: The heart size and mediastinal contours are within normal limits. Mild linear atelectasis is seen within the left infrahilar region and lateral aspect of the right lung base. There is no evidence of an acute infiltrate, pleural effusion or pneumothorax. The visualized skeletal structures are unremarkable. IMPRESSION: Mild left infrahilar and right basilar linear atelectasis. Electronically Signed   By: Virgina Norfolk M.D.   On: 07/22/2022 23:35   DG Foot Complete Left  Result Date: 07/22/2022 CLINICAL DATA:  Left foot pain. EXAM: LEFT FOOT - COMPLETE 3+ VIEW COMPARISON:  None Available. FINDINGS: There is no evidence of an acute fracture or dislocation. There is no evidence of arthropathy or other focal bone abnormality. Mild to moderate severity plantar soft tissue swelling  is seen. IMPRESSION: Mild to moderate severity plantar soft tissue swelling. Electronically Signed   By: Virgina Norfolk M.D.   On: 07/22/2022 23:34       Impression / Plan:   67 y/o lady with ESRD on dialysis, obesity, DM II, HFpEF, cirrhosis, hepatitis c (on treatment?), and hypertension here with cellulitis. Consulted for melena and anemia. Given history of cirrhosis, family history of colon cancer, and requiring blood transfusion, will perform EGD/Colonoscopy but likely anemia of chronic inflammation from her multiple comorbidities.  - plan for EGD/Colonoscopy. Needs to be done before dialysis - prep ordered - transfuse for hemoglobin < 7 - continue PPI - further recs after procedures  Raylene Miyamoto MD, MPH [place]

## 2022-07-24 NOTE — Progress Notes (Signed)
[facility]  ROUNDING NOTE   Subjective:   [name] 67 y.o. female with end-stage renal disease on hemodialysis, diabetes type 2, hypertension, coronary disease status post stent placement, history of MI in 2017, peripheral arterial disease, hyperlipidemia, GERD, chronic combined systolic and diastolic CHF, gout, asthma, hepatitis C, anemia, history of colostomy was admitted for ESRD (end stage renal disease) on dialysis ([facility]) [N18.6, Z99.2] Cellulitis of left lower extremity [L03.116]  Appreciate renal navigator confirming outpatient dialysis clinic of [facility] on a MWF schedule.  Patient seen resting in bed today, alert and oriented Tolerating meals without nausea and vomiting Currently denies pain or discomfort Room air   Objective:  Vital signs in last 24 hours:  Temp:  [97.5 F (36.4 C)-98.4 F (36.9 C)] 98.2 F (36.8 C) (01/12 0819) Pulse Rate:  [58-73] 63 (01/12 0819) Resp:  [11-19] 16 (01/12 0819) BP: (110-152)/(45-93) 110/45 (01/12 0819) SpO2:  [91 %-100 %] 98 % (01/12 0819)  Weight change:  Filed Weights   07/22/22 2211  Weight: 117.9 kg    Intake/Output: I/O last 3 completed shifts: In: 886.7 [I.V.:50; Blood:336.7; IV Piggyback:500] Out: 2500 [Other:2500]   Intake/Output this shift:  No intake/output data recorded.  Physical Exam: General: NAD  Head: Normocephalic, atraumatic. Moist oral mucosal membranes  Eyes: Anicteric  Lungs:  Clear to auscultation, normal effort  Heart: Regular rate and rhythm  Abdomen:  Soft, nontender, obese  Extremities: Trace peripheral edema.  Neurologic: Alert and oriented moving all four extremities  Skin: No lesions  Access: Left aVF    Basic Metabolic Panel: Recent Labs  Lab 07/22/22 2213 07/23/22 0645 07/24/22 0549  NA 134* 132* 134*  K 5.3* 5.0 4.4  CL 92* 94* 95*  CO2 26 21* 23  GLUCOSE 292* 149* 265*  BUN 84* 84* 60*  CREATININE 12.47* 12.64* 8.54*  CALCIUM 8.4* 8.2* 8.5*   MG  --   --  2.1  PHOS  --   --  7.1*     Liver Function Tests: No results for input(s): "AST", "ALT", "ALKPHOS", "BILITOT", "PROT", "ALBUMIN" in the last 168 hours. No results for input(s): "LIPASE", "AMYLASE" in the last 168 hours. No results for input(s): "AMMONIA" in the last 168 hours.  CBC: Recent Labs  Lab 07/22/22 2213 07/23/22 0645 07/23/22 2245 07/24/22 0549  WBC 5.2 4.8  --  7.1  HGB 7.9* 7.0* 9.4* 9.2*  HCT 26.8* 23.6* 29.7* 29.5*  MCV 80.7 81.4  --  79.5*  PLT 188 184  --  231     Cardiac Enzymes: No results for input(s): "CKTOTAL", "CKMB", "CKMBINDEX", "TROPONINI" in the last 168 hours.  BNP: Invalid input(s): "POCBNP"  CBG: Recent Labs  Lab 07/23/22 0809 07/23/22 1239 07/23/22 2055 07/24/22 0808 07/24/22 1230  GLUCAP 129* 182* 245* 256* 356*     Microbiology: Results for orders placed or performed during the hospital encounter of 07/23/22  Blood culture (routine x 2)     Status: None (Preliminary result)   Collection Time: 07/23/22  1:41 AM   Specimen: BLOOD RIGHT ARM  Result Value Ref Range Status   Specimen Description BLOOD RIGHT ARM  Final   Special Requests   Final    BOTTLES DRAWN AEROBIC AND ANAEROBIC Blood Culture adequate volume   Culture   Final    NO GROWTH 1 DAY Performed at [facility], [address], [place], [place] [address]    Report Status PENDING  Incomplete  Blood culture (routine x 2)  Status: None (Preliminary result)   Collection Time: 07/23/22  1:41 AM   Specimen: BLOOD RIGHT ARM  Result Value Ref Range Status   Specimen Description BLOOD RIGHT ARM  Final   Special Requests   Final    BOTTLES DRAWN AEROBIC AND ANAEROBIC Blood Culture adequate volume   Culture   Final    NO GROWTH 1 DAY Performed at [facility], [address], [place], [place] [address]    Report Status PENDING  Incomplete  MRSA Next Gen by PCR, Nasal     Status: None   Collection Time: 07/23/22  2:51 PM    Specimen: Nasal Mucosa; Nasal Swab  Result Value Ref Range Status   MRSA by PCR Next Gen NOT DETECTED NOT DETECTED Final    Comment: (NOTE) The GeneXpert MRSA Assay (FDA approved for NASAL specimens only), is one component of a comprehensive MRSA colonization surveillance program. It is not intended to diagnose MRSA infection nor to guide or monitor treatment for MRSA infections. Test performance is not FDA approved in patients less than 71 years old. Performed at [facility], [address] [place], [place] [address]    *Note: Due to a large number of results and/or encounters for the requested time period, some results have not been displayed. A complete set of results can be found in Results Review.    Coagulation Studies: No results for input(s): "LABPROT", "INR" in the last 72 hours.  Urinalysis: No results for input(s): "COLORURINE", "LABSPEC", "PHURINE", "GLUCOSEU", "HGBUR", "BILIRUBINUR", "KETONESUR", "PROTEINUR", "UROBILINOGEN", "NITRITE", "LEUKOCYTESUR" in the last 72 hours.  Invalid input(s): "APPERANCEUR"    Imaging: CT EXTREMITY LOWER LEFT W CONTRAST  Result Date: 07/23/2022 CLINICAL DATA:  Diabetes, infected left heel wound, ascending left lower extremity infection EXAM: CT OF THE LOWER LEFT EXTREMITY WITH CONTRAST TECHNIQUE: Multidetector CT imaging of the lower left extremity was performed according to the standard protocol following intravenous contrast administration. RADIATION DOSE REDUCTION: This exam was performed according to the departmental dose-optimization program which includes automated exposure control, adjustment of the mA and/or kV according to patient size and/or use of iterative reconstruction technique. CONTRAST:  131m OMNIPAQUE IOHEXOL 350 MG/ML SOLN COMPARISON:  None Available. FINDINGS: Bones/Joint/Cartilage Normal alignment. No acute fracture or dislocation. No osseous erosions or abnormal periosteal reaction. Mild-to-moderate  degenerative arthritis within the left knee, most severe within the medial compartment. Remaining joint spaces are preserved. Ligaments Suboptimally assessed by CT. Muscles and Tendons Intramuscular lipoma within the left adductor magnus muscle belly measuring 3.5 x 1.5 cm. Otherwise unremarkable. Soft tissues Visualized abdominopelvic contents are unremarkable. Probable 21 mm left Bartholin gland cyst. Mild nonspecific subcutaneous edema involving the anterolateral left thigh. Infiltration within the medial subcutaneous fat of the left thigh at the level of the distal diaphysis of the femur with associated coarse calcification likely postsurgical or posttraumatic in nature. There is subcutaneous edema within the left hindfoot extending superiorly to the level of the mid calf. No discrete drainable subcutaneous fluid collection identified. Inflammatory stranding appears confined to the subcutaneous compartment and there is no infiltration of the intrafascial fat of the deep compartments of the left lower extremity. No subcutaneous gas identified. Small left knee effusion. IMPRESSION: 1. Subcutaneous edema within the left hindfoot extending superiorly to the level of the mid calf. No discrete drainable subcutaneous fluid collection identified. No infiltration of the intrafascial fat of the deep compartments of the left lower extremity. No subcutaneous gas identified. 2. Infiltration within the medial subcutaneous fat of the  left thigh at the level of the distal diaphysis of the femur with associated coarse calcification likely postsurgical or posttraumatic in nature. 3. Mild-to-moderate degenerative arthritis within the left knee, most severe within the medial compartment. Small left knee effusion. Electronically Signed   By: Fidela Salisbury M.D.   On: 07/23/2022 02:31   DG Chest Port 1 View  Result Date: 07/22/2022 CLINICAL DATA:  Missed hemodialysis. EXAM: PORTABLE CHEST 1 VIEW COMPARISON:  May 26, 2022  FINDINGS: The heart size and mediastinal contours are within normal limits. Mild linear atelectasis is seen within the left infrahilar region and lateral aspect of the right lung base. There is no evidence of an acute infiltrate, pleural effusion or pneumothorax. The visualized skeletal structures are unremarkable. IMPRESSION: Mild left infrahilar and right basilar linear atelectasis. Electronically Signed   By: Virgina Norfolk M.D.   On: 07/22/2022 23:35   DG Foot Complete Left  Result Date: 07/22/2022 CLINICAL DATA:  Left foot pain. EXAM: LEFT FOOT - COMPLETE 3+ VIEW COMPARISON:  None Available. FINDINGS: There is no evidence of an acute fracture or dislocation. There is no evidence of arthropathy or other focal bone abnormality. Mild to moderate severity plantar soft tissue swelling is seen. IMPRESSION: Mild to moderate severity plantar soft tissue swelling. Electronically Signed   By: Virgina Norfolk M.D.   On: 07/22/2022 23:34     Medications:    anticoagulant sodium citrate     piperacillin-tazobactam 2.25 g (07/24/22 1313)   vancomycin 1,000 mg (07/23/22 2223)    sodium chloride   Intravenous Once   aspirin EC  81 mg Oral Daily   atorvastatin  80 mg Oral QPM   bisacodyl  10 mg Oral QHS   carvedilol  12.5 mg Oral BID WC   Chlorhexidine Gluconate Cloth  6 each Topical Q0600   vitamin B-12  500 mcg Oral Daily   fluticasone furoate-vilanterol  1 puff Inhalation Daily   folic acid  1 mg Oral Daily   insulin aspart  0-5 Units Subcutaneous QHS   insulin aspart  0-9 Units Subcutaneous TID WC   insulin NPH Human  15 Units Subcutaneous BID AC & HS   methylPREDNISolone (SOLU-MEDROL) injection  40 mg Intravenous Q24H   midodrine  10 mg Oral Q lunch   naloxone  1 spray Nasal UD   oxyCODONE  10 mg Oral Q6H   pantoprazole (PROTONIX) IV  40 mg Intravenous Q12H   polyethylene glycol  17 g Oral Daily   pregabalin  50 mg Oral BID   sucroferric oxyhydroxide  1,500 mg Oral TID   valACYclovir   500 mg Oral Daily   acetaminophen **OR** acetaminophen, albuterol, alteplase, anticoagulant sodium citrate, fentaNYL (SUBLIMAZE) injection, fluticasone, heparin, hydrOXYzine, lidocaine (PF), lidocaine-prilocaine, methocarbamol, metoCLOPramide (REGLAN) injection, nitroGLYCERIN, pentafluoroprop-tetrafluoroeth, traZODone  Assessment/ Plan:  [name] is a 67 y.o.  female end-stage renal disease on hemodialysis, diabetes type 2, hypertension, coronary disease status post stent placement, history of MI in 2017, peripheral arterial disease, hyperlipidemia, GERD, chronic combined systolic and diastolic CHF, gout, asthma, hepatitis C, anemia, history of colostomy was admitted for ESRD (end stage renal disease) on dialysis ([facility]) [N18.6, Z99.2] Cellulitis of left lower extremity [Q67.619]  [facility]/MWF/left aVF  End-stage renal disease on hemodialysis.  Patient received dialysis treatment yesterday, UF 2.5 L achieved.  Will dialyze patient again tomorrow.  Patient will resume normal outpatient schedule next week.  2. Anemia of chronic kidney disease Lab Results  Component Value Date  HGB 9.2 (L) 07/24/2022    Patient receives [place] outpatient.  Hemoglobin has improved with 1 unit blood transfusion yesterday.  3. Secondary Hyperparathyroidism: with outpatient labs: PTH 664, phosphorus 6.7, calcium 9.0 on 07/16/2022.   Lab Results  Component Value Date   PTH 232 (H) 08/16/2017   CALCIUM 8.5 (L) 07/24/2022   CAION 0.86 (LL) 05/26/2022   PHOS 7.1 (H) 07/24/2022    Patient currently prescribed vitamin D supplementation and Auryxia with meals outpatient.  Calcium acceptable however phosphorus elevated.  Will consider restarting binders.  Will obtain updated PTH.  4.  Hypertension with chronic kidney disease.  Home regimen includes carvedilol and midodrine.  Both currently prescribed.   LOS: [address] 1/12/20241:36 PM

## 2022-07-25 ENCOUNTER — Encounter: Payer: Self-pay | Admitting: Family Medicine

## 2022-07-25 ENCOUNTER — Encounter
Admission: EM | Disposition: A | Discharge status: Home Health | Payer: Self-pay | Source: Home / Self Care | Attending: Student

## 2022-07-25 ENCOUNTER — Inpatient Hospital Stay: Payer: 59 | Admitting: Anesthesiology

## 2022-07-25 DIAGNOSIS — I10 Essential (primary) hypertension: Secondary | ICD-10-CM | POA: Diagnosis not present

## 2022-07-25 DIAGNOSIS — M10371 Gout due to renal impairment, right ankle and foot: Secondary | ICD-10-CM | POA: Diagnosis not present

## 2022-07-25 DIAGNOSIS — L03116 Cellulitis of left lower limb: Secondary | ICD-10-CM | POA: Diagnosis not present

## 2022-07-25 DIAGNOSIS — N186 End stage renal disease: Secondary | ICD-10-CM | POA: Diagnosis not present

## 2022-07-25 HISTORY — PX: COLONOSCOPY WITH PROPOFOL: SHX5780

## 2022-07-25 HISTORY — PX: ESOPHAGOGASTRODUODENOSCOPY (EGD) WITH PROPOFOL: SHX5813

## 2022-07-25 LAB — BASIC METABOLIC PANEL
Anion gap: 17 — ABNORMAL HIGH (ref 5–15)
BUN: 75 mg/dL — ABNORMAL HIGH (ref 8–23)
CO2: 21 mmol/L — ABNORMAL LOW (ref 22–32)
Calcium: 8.6 mg/dL — ABNORMAL LOW (ref 8.9–10.3)
Chloride: 93 mmol/L — ABNORMAL LOW (ref 98–111)
Creatinine, Ser: 10.47 mg/dL — ABNORMAL HIGH (ref 0.44–1.00)
GFR, Estimated: 4 mL/min — ABNORMAL LOW (ref >60)
Glucose, Bld: 111 mg/dL — ABNORMAL HIGH (ref 70–99)
Potassium: 4.2 mmol/L (ref 3.5–5.1)
Sodium: 131 mmol/L — ABNORMAL LOW (ref 135–145)

## 2022-07-25 LAB — CBC
HCT: 27.2 % — ABNORMAL LOW (ref 36.0–46.0)
Hemoglobin: 8.6 g/dL — ABNORMAL LOW (ref 12.0–15.0)
MCH: 24.8 pg — ABNORMAL LOW (ref 26.0–34.0)
MCHC: 31.6 g/dL (ref 30.0–36.0)
MCV: 78.4 fL — ABNORMAL LOW (ref 80.0–100.0)
Platelets: 222 10*3/uL (ref 150–400)
RBC: 3.47 MIL/uL — ABNORMAL LOW (ref 3.87–5.11)
RDW: 22.6 % — ABNORMAL HIGH (ref 11.5–15.5)
WBC: 6.6 10*3/uL (ref 4.0–10.5)
nRBC: 0 % (ref 0.0–0.2)

## 2022-07-25 LAB — PHOSPHORUS: Phosphorus: 8.3 mg/dL — ABNORMAL HIGH (ref 2.5–4.6)

## 2022-07-25 LAB — GLUCOSE, CAPILLARY
Glucose-Capillary: 155 mg/dL — ABNORMAL HIGH (ref 70–99)
Glucose-Capillary: 110 mg/dL — ABNORMAL HIGH (ref 70–99)
Glucose-Capillary: 122 mg/dL — ABNORMAL HIGH (ref 70–99)
Glucose-Capillary: 219 mg/dL — ABNORMAL HIGH (ref 70–99)

## 2022-07-25 LAB — MAGNESIUM: Magnesium: 2.1 mg/dL (ref 1.7–2.4)

## 2022-07-25 LAB — HEPATITIS B SURFACE ANTIBODY, QUANTITATIVE: Hep B S AB Quant (Post): 14.8 m[IU]/mL (ref >9.9)

## 2022-07-25 SURGERY — ESOPHAGOGASTRODUODENOSCOPY (EGD) WITH PROPOFOL
Anesthesia: General

## 2022-07-25 MED ORDER — ALLOPURINOL 100 MG PO TABS: 100.0000 mg | ORAL_TABLET | ORAL | Status: DC

## 2022-07-25 MED ORDER — SODIUM CHLORIDE 0.9 % IV SOLN: INTRAVENOUS | Status: DC | PRN

## 2022-07-25 MED ORDER — PREDNISONE 20 MG PO TABS
20.0000 mg | ORAL_TABLET | Freq: Every day | ORAL | Status: DC
  Administered 2022-07-26: 20 mg via ORAL
  Filled 2022-07-25: qty 1

## 2022-07-25 MED ORDER — PROPOFOL 10 MG/ML IV BOLUS
INTRAVENOUS | Status: DC | PRN
  Administered 2022-07-25: 80 mg via INTRAVENOUS

## 2022-07-25 MED ORDER — LIDOCAINE HCL (CARDIAC) PF 100 MG/5ML IV SOSY
PREFILLED_SYRINGE | INTRAVENOUS | Status: DC | PRN
  Administered 2022-07-25: 100 mg via INTRAVENOUS

## 2022-07-25 MED ORDER — PANTOPRAZOLE SODIUM 40 MG PO TBEC
40.0000 mg | DELAYED_RELEASE_TABLET | Freq: Two times a day (BID) | ORAL | Status: DC
  Administered 2022-07-25 – 2022-07-26 (×3): 40 mg via ORAL
  Filled 2022-07-25 (×3): qty 1

## 2022-07-25 MED ORDER — EPHEDRINE SULFATE-NACL 50-0.9 MG/10ML-% IV SOSY
PREFILLED_SYRINGE | INTRAVENOUS | Status: DC | PRN
  Administered 2022-07-25 (×2): 10 mg via INTRAVENOUS

## 2022-07-25 MED ORDER — PENTAFLUOROPROP-TETRAFLUOROETH EX AERO
INHALATION_SPRAY | CUTANEOUS | Status: AC
  Filled 2022-07-25: qty 30

## 2022-07-25 MED ORDER — PROPOFOL 1000 MG/100ML IV EMUL
INTRAVENOUS | Status: AC
  Filled 2022-07-25: qty 100

## 2022-07-25 MED ORDER — LIDOCAINE HCL (PF) 2 % IJ SOLN
INTRAMUSCULAR | Status: AC
  Filled 2022-07-25: qty 5

## 2022-07-25 MED ORDER — PROPOFOL 500 MG/50ML IV EMUL
INTRAVENOUS | Status: DC | PRN
  Administered 2022-07-25: 150 ug/kg/min via INTRAVENOUS

## 2022-07-25 MED ORDER — SALINE SPRAY 0.65 % NA SOLN
1.0000 | NASAL | Status: DC | PRN
  Filled 2022-07-25: qty 44

## 2022-07-25 NOTE — Progress Notes (Signed)
Pt ran for 3.5hrs with 2.5L fluid removal.    07/25/22 1733  Vitals  Temp 98.3 F (36.8 C)  Temp Source Oral  BP (!) 149/59  MAP (mmHg) 85  BP Location Right Leg  BP Method Automatic  Patient Position (if appropriate) Lying  Pulse Rate 63  Pulse Rate Source Monitor  ECG Heart Rate 63  Resp 13  Oxygen Therapy  SpO2 96 %  O2 Device Room Air  Patient Activity (if Appropriate) In bed  Pulse Oximetry Type Continuous  During Treatment Monitoring  HD Safety Checks Performed Yes  Intra-Hemodialysis Comments Tx completed;Tolerated well  Fistula / Graft Left Upper arm Arteriovenous fistula  Placement Date/Time: 07/24/22 0745   Placed prior to admission: Yes  Orientation: Left  Access Location: Upper arm  Access Type: Arteriovenous fistula  Site Condition No complications  Fistula / Graft Assessment Thrill;Bruit;Present  Status Patent  Drainage Description None

## 2022-07-25 NOTE — Op Note (Signed)
[facility] Gastroenterology Patient Name: [name] Procedure Date: 07/25/2022 8:07 AM MRN: [id number] Account #: [id number] Date of Birth: September 14, 1955 Admit Type: Outpatient Age: 67 Room: [facility] ENDO ROOM 4 Gender: Female Note Status: Finalized Instrument Name: Park Meo [id number] Procedure:             Colonoscopy Indications:           Melena Providers:             Andrey Farmer MD, MD Medicines:             Monitored Anesthesia Care Complications:         No immediate complications. Procedure:             Pre-Anesthesia Assessment:                        - Prior to the procedure, a History and Physical was                         performed, and patient medications and allergies were                         reviewed. The patient is competent. The risks and                         benefits of the procedure and the sedation options and                         risks were discussed with the patient. All questions                         were answered and informed consent was obtained.                         Patient identification and proposed procedure were                         verified by the physician, the nurse, the                         anesthesiologist, the anesthetist and the technician                         in the endoscopy suite. Mental Status Examination:                         alert and oriented. Airway Examination: normal                         oropharyngeal airway and neck mobility. Respiratory                         Examination: clear to auscultation. CV Examination:                         normal. Prophylactic Antibiotics: The patient does not                         require prophylactic antibiotics. Prior  Anticoagulants: The patient has taken no anticoagulant                         or antiplatelet agents. ASA Grade Assessment: IV - A                         patient with severe systemic disease that is a                          constant threat to life. After reviewing the risks and                         benefits, the patient was deemed in satisfactory                         condition to undergo the procedure. The anesthesia                         plan was to use monitored anesthesia care (MAC).                         Immediately prior to administration of medications,                         the patient was re-assessed for adequacy to receive                         sedatives. The heart rate, respiratory rate, oxygen                         saturations, blood pressure, adequacy of pulmonary                         ventilation, and response to care were monitored                         throughout the procedure. The physical status of the                         patient was re-assessed after the procedure.                        After obtaining informed consent, the colonoscope was                         passed under direct vision. Throughout the procedure,                         the patient's blood pressure, pulse, and oxygen                         saturations were monitored continuously. The                         Colonoscope was introduced through the anus and                         advanced to the the cecum, identified by appendiceal  orifice and ileocecal valve. The colonoscopy was                         performed without difficulty. The patient tolerated                         the procedure well. The quality of the bowel                         preparation was inadequate. The ileocecal valve and                         the appendiceal orifice were photographed. Findings:      The perianal and digital rectal examinations were normal.      Internal hemorrhoids were found during endoscopy. The hemorrhoids were       Grade I (internal hemorrhoids that do not prolapse).      The exam was otherwise without abnormality. Impression:            - Preparation of the  colon was inadequate.                        - Internal hemorrhoids.                        - The examination was otherwise normal.                        - No specimens collected. Recommendation:        - Return patient to hospital ward for ongoing care.                        - Resume previous diet.                        - Continue present medications.                        - Anemia is likely secondary to ESRD and her other                         comorbidities. No further inpatient GI work-up                         required. Prep was not adequate for surveillance but                         no large lesions or blood was noted in the entire                         colon. Procedure Code(s):     --- Professional ---                        [phone], Colonoscopy, flexible; diagnostic, including                         collection of specimen(s) by brushing or washing, when  performed (separate procedure) Diagnosis Code(s):     --- Professional ---                        K64.0, First degree hemorrhoids                        K92.1, Melena (includes Hematochezia) CPT copyright 2022 [employer]. All rights reserved. The codes documented in this report are preliminary and upon coder review may  be revised to meet current compliance requirements. Andrey Farmer MD, MD 07/25/2022 8:42:45 AM Number of Addenda: 0 Note Initiated On: 07/25/2022 8:07 AM Scope Withdrawal Time: 0 hours 5 minutes 16 seconds  Total Procedure Duration: 0 hours 9 minutes 36 seconds  Estimated Blood Loss:  Estimated blood loss: none.      [facility]

## 2022-07-25 NOTE — Progress Notes (Signed)
[facility]  ROUNDING NOTE   Subjective:   [name] 67 y.o. female with end-stage renal disease on hemodialysis, diabetes type 2, hypertension, coronary disease status post stent placement, history of MI in 2017, peripheral arterial disease, hyperlipidemia, GERD, chronic combined systolic and diastolic CHF, gout, asthma, hepatitis C, anemia, history of colostomy was admitted for ESRD (end stage renal disease) on dialysis ([place]) [N18.6, Z99.2] Cellulitis of left lower extremity [L03.116]  Appreciate renal navigator confirming outpatient dialysis clinic of [facility] on a MWF schedule.  Patient seen laying in bed, drowsy GI procedures this morning Scheduled for dialysis later today.   Objective:  Vital signs in last 24 hours:  Temp:  [96.9 F (36.1 C)-98.1 F (36.7 C)] 97.5 F (36.4 C) (01/13 0931) Pulse Rate:  [60-97] 66 (01/13 0931) Resp:  [10-20] 18 (01/13 0931) BP: (125-163)/(57-101) 145/61 (01/13 0931) SpO2:  [97 %-100 %] 99 % (01/13 0931)  Weight change:  Filed Weights   07/22/22 2211  Weight: 117.9 kg    Intake/Output: I/O last 3 completed shifts: In: 760 [P.O.:360; IV Piggyback:400] Out: 2500 [Other:2500]   Intake/Output this shift:  Total I/O In: 221.2 [I.V.:121.2; IV Piggyback:100] Out: -   Physical Exam: General: NAD  Head: Normocephalic, atraumatic. Moist oral mucosal membranes  Eyes: Anicteric  Lungs:  Clear to auscultation, normal effort  Heart: Regular rate and rhythm  Abdomen:  Soft, nontender, obese  Extremities: Trace peripheral edema.  Neurologic: Alert and oriented moving all four extremities  Skin: No lesions  Access: Left aVF    Basic Metabolic Panel: Recent Labs  Lab 07/22/22 2213 07/23/22 0645 07/24/22 0549  NA 134* 132* 134*  K 5.3* 5.0 4.4  CL 92* 94* 95*  CO2 26 21* 23  GLUCOSE 292* 149* 265*  BUN 84* 84* 60*  CREATININE 12.47* 12.64* 8.54*  CALCIUM 8.4* 8.2* 8.5*  MG  --   --  2.1  PHOS  --    --  7.1*     Liver Function Tests: No results for input(s): "AST", "ALT", "ALKPHOS", "BILITOT", "PROT", "ALBUMIN" in the last 168 hours. No results for input(s): "LIPASE", "AMYLASE" in the last 168 hours. No results for input(s): "AMMONIA" in the last 168 hours.  CBC: Recent Labs  Lab 07/22/22 2213 07/23/22 0645 07/23/22 2245 07/24/22 0549  WBC 5.2 4.8  --  7.1  HGB 7.9* 7.0* 9.4* 9.2*  HCT 26.8* 23.6* 29.7* 29.5*  MCV 80.7 81.4  --  79.5*  PLT 188 184  --  231     Cardiac Enzymes: No results for input(s): "CKTOTAL", "CKMB", "CKMBINDEX", "TROPONINI" in the last 168 hours.  BNP: Invalid input(s): "POCBNP"  CBG: Recent Labs  Lab 07/24/22 1230 07/24/22 1608 07/24/22 2147 07/25/22 0600 07/25/22 0934  GLUCAP 356* 258* 286* 155* 110*     Microbiology: Results for orders placed or performed during the hospital encounter of 07/23/22  Blood culture (routine x 2)     Status: None (Preliminary result)   Collection Time: 07/23/22  1:41 AM   Specimen: BLOOD RIGHT ARM  Result Value Ref Range Status   Specimen Description BLOOD RIGHT ARM  Final   Special Requests   Final    BOTTLES DRAWN AEROBIC AND ANAEROBIC Blood Culture adequate volume   Culture   Final    NO GROWTH 2 DAYS Performed at [facility], [address], [place],  [address]    Report Status PENDING  Incomplete  Blood culture (routine x 2)  Status: None (Preliminary result)   Collection Time: 07/23/22  1:41 AM   Specimen: BLOOD RIGHT ARM  Result Value Ref Range Status   Specimen Description BLOOD RIGHT ARM  Final   Special Requests   Final    BOTTLES DRAWN AEROBIC AND ANAEROBIC Blood Culture adequate volume   Culture   Final    NO GROWTH 2 DAYS Performed at [facility], [address], [place], [place] [address]    Report Status PENDING  Incomplete  MRSA Next Gen by PCR, Nasal     Status: None   Collection Time: 07/23/22  2:51 PM   Specimen: Nasal Mucosa; Nasal Swab   Result Value Ref Range Status   MRSA by PCR Next Gen NOT DETECTED NOT DETECTED Final    Comment: (NOTE) The GeneXpert MRSA Assay (FDA approved for NASAL specimens only), is one component of a comprehensive MRSA colonization surveillance program. It is not intended to diagnose MRSA infection nor to guide or monitor treatment for MRSA infections. Test performance is not FDA approved in patients less than 4 years old. Performed at [facility], [address] [place], [place] [address]    *Note: Due to a large number of results and/or encounters for the requested time period, some results have not been displayed. A complete set of results can be found in Results Review.    Coagulation Studies: No results for input(s): "LABPROT", "INR" in the last 72 hours.  Urinalysis: No results for input(s): "COLORURINE", "LABSPEC", "PHURINE", "GLUCOSEU", "HGBUR", "BILIRUBINUR", "KETONESUR", "PROTEINUR", "UROBILINOGEN", "NITRITE", "LEUKOCYTESUR" in the last 72 hours.  Invalid input(s): "APPERANCEUR"    Imaging: No results found.   Medications:    anticoagulant sodium citrate     piperacillin-tazobactam 100 mL/hr at 07/25/22 0946   vancomycin 200 mL/hr at 07/25/22 0946    allopurinol  100 mg Oral Daily   aspirin EC  81 mg Oral Daily   atorvastatin  80 mg Oral QPM   bisacodyl  10 mg Oral QHS   carvedilol  12.5 mg Oral BID WC   Chlorhexidine Gluconate Cloth  6 each Topical Q0600   vitamin B-12  500 mcg Oral Daily   fluticasone furoate-vilanterol  1 puff Inhalation Daily   folic acid  1 mg Oral Daily   insulin aspart  0-5 Units Subcutaneous QHS   insulin aspart  0-9 Units Subcutaneous TID WC   insulin NPH Human  15 Units Subcutaneous BID AC & HS   midodrine  10 mg Oral Q lunch   naloxone  1 spray Nasal UD   oxyCODONE  10 mg Oral Q6H   pantoprazole  40 mg Oral BID   polyethylene glycol  17 g Oral Daily   [START ON 07/26/2022] predniSONE  20 mg Oral Q breakfast   pregabalin   50 mg Oral BID   sucroferric oxyhydroxide  1,500 mg Oral TID   valACYclovir  500 mg Oral Daily   acetaminophen **OR** acetaminophen, albuterol, alteplase, anticoagulant sodium citrate, fentaNYL (SUBLIMAZE) injection, fluticasone, heparin, hydrOXYzine, lidocaine (PF), lidocaine-prilocaine, methocarbamol, metoCLOPramide (REGLAN) injection, nitroGLYCERIN, pentafluoroprop-tetrafluoroeth, sodium chloride, traZODone  Assessment/ Plan:  [name] is a 67 y.o.  female end-stage renal disease on hemodialysis, diabetes type 2, hypertension, coronary disease status post stent placement, history of MI in 2017, peripheral arterial disease, hyperlipidemia, GERD, chronic combined systolic and diastolic CHF, gout, asthma, hepatitis C, anemia, history of colostomy was admitted for ESRD (end stage renal disease) on dialysis ([place]) [N18.6, Z99.2] Cellulitis of left lower  extremity [L79.892]  [facility] [place]/MWF/left aVF  End-stage renal disease on hemodialysis. Scheduled for dialysis later today.  2. Anemia of chronic kidney disease Lab Results  Component Value Date   HGB 9.2 (L) 07/24/2022    Patient receives [place] outpatient.  Hemoglobin stable for now.   3. Secondary Hyperparathyroidism: with outpatient labs: PTH 664, phosphorus 6.7, calcium 9.0 on 07/16/2022.   Lab Results  Component Value Date   PTH 232 (H) 08/16/2017   CALCIUM 8.5 (L) 07/24/2022   CAION 0.86 (LL) 05/26/2022   PHOS 7.1 (H) 07/24/2022    Patient currently prescribed vitamin D supplementation and Auryxia with meals outpatient. PTH pending.  4.  Hypertension with chronic kidney disease.  Home regimen includes carvedilol and midodrine.  Both currently prescribed.   LOS: 2   1/13/202411:39 AM

## 2022-07-25 NOTE — Care Plan (Signed)
EGD/Colonoscopy unremarkable. No further GI interventions planned. Can follow-up with her previous GI doctor as an outpatient. Can resume solid food diet.  Raylene Miyamoto MD, MPH [place]

## 2022-07-25 NOTE — Anesthesia Preprocedure Evaluation (Signed)
Anesthesia Evaluation  Patient identified by MRN, date of birth, ID band  History of Anesthesia Complications Negative for: history of anesthetic complications  Airway Mallampati: III  TM Distance: >3 FB     Dental  (+) Partial Upper, Dental Advidsory Given   Pulmonary neg shortness of breath, asthma , sleep apnea , pneumonia, COPD, neg recent URI, former smoker          Cardiovascular hypertension, (-) angina + CAD, + Past MI, + Cardiac Stents and +CHF  + Valvular Problems/Murmurs AS      Neuro/Psych  PSYCHIATRIC DISORDERS  Depression       GI/Hepatic ,GERD  ,,(+) Hepatitis -, C  Endo/Other  diabetes  Morbid obesity  Renal/GU Dialysis and ESRFRenal disease     Musculoskeletal  (+) Arthritis , Osteoarthritis,    Abdominal   Peds  Hematology  (+) Blood dyscrasia, anemia   Anesthesia Other Findings Past Medical History: No date: Anemia No date: Aortic stenosis     Comment:  Echo 8/18: mean 13, peak 28, LVOT/AV mean velocity 0.51 No date: Arthritis No date: Asthma     Comment:  As a child  No date: Bronchitis No date: CAD (coronary artery disease)     Comment:  a. 09/2016: 50% Ost 1st Mrg stenosis, 50% 2nd Mrg               stenosis, 20% Mid-Cx, 95% Prox LAD, 40% mid-LAD, and 10%               dist-LAD stenosis. Staged PCI with DES to Prox-LAD.  2011: Chronic combined systolic and diastolic CHF (congestive heart  failure) (HCC)     Comment:  echo 2/18: EF 55-60, normal wall motion, grade 2               diastolic dysfunction, trivial AI // echo 3/18: Septal               and apical HK, EF 45-50, normal wall motion, trivial AI,               mild LAE, PASP 38 // echo 8/18: EF 60-65, normal wall               motion, grade 1 diastolic dysfunction, calcified aortic               valve leaflets, mild aortic stenosis (mean 13, peak 28,               LVOT/AV mean velocity 0.51), mild AI, moderate MAC, mild                LAE, trivial TR  No date: Chronic kidney disease     Comment:  STAGE 4 No date: Chronic kidney disease on chronic dialysis ([place])     Comment:  t, th, sat No date: Complication of anesthesia No date: Depression Dx 1989: Diabetes mellitus No date: Elevated lipids No date: GERD (gastroesophageal reflux disease) No date: Gout No date: Heart murmur     Comment:  asymptomatic Dx 2013: Hepatitis C Dx 1989: Hypertension No date: Infected surgical wound     Comment:  Lt arm 07/2015: Myocardial infarction ([place]) No date: Obesity 2013: Pancreatitis No date: Pneumonia No date: Refusal of blood transfusions as patient is Jehovah's Witness     Comment:  pt states she is not [place] witness and does not refuse              blood products No date: Tendinitis  No date: Tremors of nervous system     Comment:  LEFT HAND 2010: Ulcer   Reproductive/Obstetrics                             Anesthesia Physical Anesthesia Plan  ASA: 4  Anesthesia Plan: General   Post-op Pain Management:    Induction: Intravenous  PONV Risk Score and Plan: 3 and Propofol infusion and TIVA  Airway Management Planned: Natural Airway and Nasal Cannula  Additional Equipment:   Intra-op Plan:   Post-operative Plan:   Informed Consent: I have reviewed the patients History and Physical, chart, labs and discussed the procedure including the risks, benefits and alternatives for the proposed anesthesia with the patient or authorized representative who has indicated his/her understanding and acceptance.     Dental advisory given  Plan Discussed with: CRNA and Surgeon  Anesthesia Plan Comments:         Anesthesia Quick Evaluation

## 2022-07-25 NOTE — Progress Notes (Signed)
Progress Note   Patient: [name] UEA:[id number] DOB: 23-Mar-1956 DOA: 07/23/2022     2 DOS: the patient was seen and examined on 07/25/2022   Brief hospital course: HIILANI JETTER is a 67 y.o. African-American female with medical history significant for ESRD on HD on TTS, DM2, GERD, CAD, status post PCI and stent, diastolic dysfunction, depression, gout, hepatitis C and hypertension, who presented to the emergency room with acute onset of left heel and leg pain which has been intermittent over the last couple of months.  She noticed worsening redness of the heel with associated tenderness and pain and extension to her lower leg. [place] and prescribed p.o. Bactrim and clindamycin for cellulitis of the left leg.    ED workup,    maging: Chest x-Priestly showed mild left infrahilar right basilar linear atelectasis.  Left foot x-Wiltsie showed mild to moderate severity plantar soft tissue swelling.  CT of the left lower extremity revealed the following: 1. Subcutaneous edema within the left hindfoot extending superiorly to the level of the mid calf. No discrete drainable subcutaneous fluid collection identified. No infiltration of the intrafascial fat of the deep compartments of the left lower extremity. No subcutaneous gas identified. 2. Infiltration within the medial subcutaneous fat of the left thigh at the level of the distal diaphysis of the femur with associated coarse calcification likely postsurgical or posttraumatic in nature. 3. Mild-to-moderate degenerative arthritis within the left knee, most severe within the medial compartment. Small left knee effusion.   The patient was given a gram of IV vancomycin and Zosyn and 4 mg of IV morphine sulfate.  She will be admitted to a medical bed for further evaluation and management.       Assessment and Plan:   # Cellulitis of left lower extremity, left ankle joint and left heel blister -  continue antibiotic therapy with IV  Zosyn and vancomycin. - Pain management will be provided. - Warm compresses as needed  # Gout flareup left ankle joint Uric acid 9.9 elevated S/p colchicine 0.3 mg given but discontinued as per pharmacy, contraindicated in a dialysis patient. Started Solu-Medrol 40 mg IV daily -switch to oral prednisone today Started allopurinol today as a gout flare seem to be improving    Anemia secondary to GI bleeding, FOBT positive.  Patient was complaining of dark stools Switch IV Protonix to oral twice a day Hb 7.0, patient received 1 unit of transfusion on 1/11 Hb 9.2 posttransfusion EGD/colonoscopy unremarkable per GI today.  Start regular diet   Folic acid level 7.3, at lower end, started folic acid 1 mg p.o. daily follow with PCP to repeat folic acid level after 3 months. Vitamin B12 level 373, goal >400--500, started oral supplement.,  Continue for 3 months.    ESRD on hemodialysis [facility]) - Nephrology consulted for HD TTS schedule.  Patient getting dialyzed today   Essential hypertension - continue home antihypertensives. Monitor BP and titrate medications accordingly   Type 2 diabetes mellitus with chronic kidney disease, with long-term current use of insulin (HCC) - Continue insulin Novolin 15 units subcu twice daily and sliding scale.  Adjust as needed   Peripheral neuropathy - continue Lyrica.   GERD without esophagitis - continue PPI therapy.   Morbid obesity, Body mass index is 39.53 kg/m.  Interventions: Calorie restricted diet and daily exercise advised to lose body weight.       Pressure Injury 07/24/22 Heel Left Stage 2 -  Partial  thickness loss of dermis presenting as a shallow open injury with a red, pink wound bed without slough. serous blister left heel (Active)  07/24/22 0946  Location: Heel  Location Orientation: Left  Staging: Stage 2 -  Partial thickness loss of dermis presenting as a shallow open injury with a red, pink wound bed without slough.  Wound  Description (Comments): serous blister left heel  Present on Admission: Yes  Dressing Type None 07/24/22 0743       Subjective: Complains of left lower extremity pain and right hip pain.  Just returned from endoscopy.  Wants to eat, waiting for dialysis  Physical Exam: Vitals:   07/25/22 0729 07/25/22 0845 07/25/22 0900 07/25/22 0931  BP: (!) 155/101 (!) 125/58 (!) 145/71 (!) 145/61  Pulse: 63 62 67 66  Resp: '20 10  18  '$ Temp: (!) 96.9 F (36.1 C) (!) 97.4 F (36.3 C)  (!) 97.5 F (36.4 C)  TempSrc: Temporal Temporal  Oral  SpO2: 98% 100% 99% 99%  Weight:      Height:       Physical Exam: General: NAD, lying comfortably Appear in no distress, affect appropriate Eyes: PERRLA ENT: Oral Mucosa Clear, moist  Neck: no JVD,  Cardiovascular: S1 and S2 Present, no Murmur,  Respiratory: good respiratory effort, Bilateral Air entry equal and Decreased, no Crackles, no wheezes Abdomen: Bowel Sound present, Soft and no tenderness,  Skin: Left heel blister Extremities: RLE wnl, LLE ankle swelling and tenderness, left heel blister Neurologic: Alert and awake, nonfocal   Data Reviewed:  EGD/colonoscopy unremarkable  Family Communication: None at bedside  Disposition: Status is: Inpatient Remains inpatient appropriate because: Getting dialysis today.  Monitoring of H&H and making sure she tolerates diet.  Possible discharge in next 1 to 2 days depending on clinical condition  Planned Discharge Destination: Home with Home Health   DVT prophylaxis-SCDs Time spent: 35 minutes  Author: Max Sane, MD 07/25/2022 11:29 AM  For on call review [email or web address].

## 2022-07-25 NOTE — Transfer of Care (Signed)
Immediate Anesthesia Transfer of Care Note  Patient: [name]  Procedure(s) Performed: Procedure(s): ESOPHAGOGASTRODUODENOSCOPY (EGD) WITH PROPOFOL (N/A) COLONOSCOPY WITH PROPOFOL (N/A)  Patient Location: PACU  Anesthesia Type:General  Level of Consciousness: sedated  Airway & Oxygen Therapy: Patient Spontanous Breathing and Patient connected to face mask oxygen  Post-op Assessment: Report given to RN and Post -op Vital signs reviewed and stable  Post vital signs: Reviewed and stable  Last Vitals:  Vitals:   07/25/22 0729 07/25/22 0845  BP: (!) 155/101 (!) 125/58  Pulse: 63 62  Resp: 20 10  Temp: (!) 36.1 C (!) 36.3 C  SpO2: 06% 349%    Complications: No apparent anesthesia complications

## 2022-07-25 NOTE — Anesthesia Procedure Notes (Signed)
Date/Time: 07/25/2022 8:05 AM  Performed by: Doreen Salvage, CRNAPre-anesthesia Checklist: Patient identified, Emergency Drugs available, Suction available and Patient being monitored Patient Re-evaluated:Patient Re-evaluated prior to induction Oxygen Delivery Method: Nasal cannula Induction Type: IV induction Dental Injury: Teeth and Oropharynx as per pre-operative assessment  Comments: Nasal cannula with etCO2 monitoring

## 2022-07-25 NOTE — Op Note (Signed)
[facility] Gastroenterology Patient Name: [name] Procedure Date: 07/25/2022 8:07 AM MRN: [id number] Account #: [id number] Date of Birth: 1955/12/08 Admit Type: Outpatient Age: 67 Room: [facility] ENDO ROOM 4 Gender: Female Note Status: Finalized Instrument Name: Altamese Cabal Endoscope [id number] Procedure:             Upper GI endoscopy Indications:           Melena Providers:             Andrey Farmer MD, MD Medicines:             Monitored Anesthesia Care Complications:         No immediate complications. Procedure:             Pre-Anesthesia Assessment:                        - Prior to the procedure, a History and Physical was                         performed, and patient medications and allergies were                         reviewed. The patient is competent. The risks and                         benefits of the procedure and the sedation options and                         risks were discussed with the patient. All questions                         were answered and informed consent was obtained.                         Patient identification and proposed procedure were                         verified by the physician, the nurse, the                         anesthesiologist, the anesthetist and the technician                         in the endoscopy suite. Mental Status Examination:                         alert and oriented. Airway Examination: normal                         oropharyngeal airway and neck mobility. Respiratory                         Examination: clear to auscultation. CV Examination:                         normal. Prophylactic Antibiotics: The patient does not                         require prophylactic antibiotics. Prior  Anticoagulants: The patient has taken no anticoagulant                         or antiplatelet agents. ASA Grade Assessment: IV - A                         patient with severe systemic disease that is  a                         constant threat to life. After reviewing the risks and                         benefits, the patient was deemed in satisfactory                         condition to undergo the procedure. The anesthesia                         plan was to use monitored anesthesia care (MAC).                         Immediately prior to administration of medications,                         the patient was re-assessed for adequacy to receive                         sedatives. The heart rate, respiratory rate, oxygen                         saturations, blood pressure, adequacy of pulmonary                         ventilation, and response to care were monitored                         throughout the procedure. The physical status of the                         patient was re-assessed after the procedure.                        After obtaining informed consent, the endoscope was                         passed under direct vision. Throughout the procedure,                         the patient's blood pressure, pulse, and oxygen                         saturations were monitored continuously. The Endoscope                         was introduced through the mouth, and advanced to the                         second part of duodenum. The  upper GI endoscopy was                         accomplished without difficulty. The patient tolerated                         the procedure well. Findings:      The examined esophagus was normal.      The entire examined stomach was normal.      The examined duodenum was normal. Impression:            - Normal esophagus.                        - Normal stomach.                        - Normal examined duodenum.                        - No specimens collected. Recommendation:        - Perform a colonoscopy today. Procedure Code(s):     --- Professional ---                        [phone], Esophagogastroduodenoscopy, flexible,                          transoral; diagnostic, including collection of                         specimen(s) by brushing or washing, when performed                         (separate procedure) Diagnosis Code(s):     --- Professional ---                        K92.1, Melena (includes Hematochezia) CPT copyright 2022 American Medical Association. All rights reserved. The codes documented in this report are preliminary and upon coder review may  be revised to meet current compliance requirements. Andrey Farmer MD, MD 07/25/2022 8:39:18 AM Number of Addenda: 0 Note Initiated On: 07/25/2022 8:07 AM Estimated Blood Loss:  Estimated blood loss: none.      [facility]

## 2022-07-25 NOTE — Progress Notes (Signed)
Reduced patient bfr due to high vp, push and pull is fine when checking the stick. Will continue to monitor closely.

## 2022-07-26 ENCOUNTER — Inpatient Hospital Stay: Payer: 59

## 2022-07-26 DIAGNOSIS — L03116 Cellulitis of left lower limb: Secondary | ICD-10-CM | POA: Diagnosis not present

## 2022-07-26 LAB — CBC
HCT: 27.1 % — ABNORMAL LOW (ref 36.0–46.0)
Hemoglobin: 8.4 g/dL — ABNORMAL LOW (ref 12.0–15.0)
MCH: 24.8 pg — ABNORMAL LOW (ref 26.0–34.0)
MCHC: 31 g/dL (ref 30.0–36.0)
MCV: 79.9 fL — ABNORMAL LOW (ref 80.0–100.0)
Platelets: 188 10*3/uL (ref 150–400)
RBC: 3.39 MIL/uL — ABNORMAL LOW (ref 3.87–5.11)
RDW: 22.3 % — ABNORMAL HIGH (ref 11.5–15.5)
WBC: 6.1 10*3/uL (ref 4.0–10.5)
nRBC: 0 % (ref 0.0–0.2)

## 2022-07-26 LAB — BASIC METABOLIC PANEL
Anion gap: 12 (ref 5–15)
BUN: 46 mg/dL — ABNORMAL HIGH (ref 8–23)
CO2: 25 mmol/L (ref 22–32)
Calcium: 8.2 mg/dL — ABNORMAL LOW (ref 8.9–10.3)
Chloride: 95 mmol/L — ABNORMAL LOW (ref 98–111)
Creatinine, Ser: 7.19 mg/dL — ABNORMAL HIGH (ref 0.44–1.00)
GFR, Estimated: 6 mL/min — ABNORMAL LOW (ref >60)
Glucose, Bld: 137 mg/dL — ABNORMAL HIGH (ref 70–99)
Potassium: 3.8 mmol/L (ref 3.5–5.1)
Sodium: 132 mmol/L — ABNORMAL LOW (ref 135–145)

## 2022-07-26 LAB — MAGNESIUM: Magnesium: 1.9 mg/dL (ref 1.7–2.4)

## 2022-07-26 LAB — PHOSPHORUS: Phosphorus: 5.5 mg/dL — ABNORMAL HIGH (ref 2.5–4.6)

## 2022-07-26 LAB — GLUCOSE, CAPILLARY
Glucose-Capillary: 158 mg/dL — ABNORMAL HIGH (ref 70–99)
Glucose-Capillary: 293 mg/dL — ABNORMAL HIGH (ref 70–99)

## 2022-07-26 MED ORDER — PANTOPRAZOLE SODIUM 40 MG PO TBEC: 40.0000 mg | DELAYED_RELEASE_TABLET | Freq: Two times a day (BID) | ORAL | 0 refills | Status: DC

## 2022-07-26 MED ORDER — DOXYCYCLINE HYCLATE 100 MG PO TBEC: 100.0000 mg | DELAYED_RELEASE_TABLET | Freq: Two times a day (BID) | ORAL | 0 refills | Status: AC

## 2022-07-26 MED ORDER — AMOXICILLIN-POT CLAVULANATE 500-125 MG PO TABS
1.0000 | ORAL_TABLET | Freq: Every day | ORAL | Status: DC
  Administered 2022-07-26: 1 via ORAL
  Filled 2022-07-26: qty 1

## 2022-07-26 MED ORDER — PREDNISONE 20 MG PO TABS: 20.0000 mg | ORAL_TABLET | Freq: Every day | ORAL | 0 refills | Status: AC

## 2022-07-26 NOTE — TOC Transition Note (Addendum)
Transition of Care [facility]) - CM/SW Discharge Note   Patient Details  Name: [name] MRN: [id number] Date of Birth: March 30, 1956  Transition of Care [facility] CM/SW Contact:  Magnus Ivan, LCSW Phone Number: 07/26/2022, 11:02 AM   Clinical Narrative:    Patient to DC home today per MD. Spoke with patient who is agreeable to RW and [facility] recommendations from Therapy. Confirmed home address. Daughter to transport today.  RW ordered through [facility] with [employer] to be delivered to bedside who is aware of DC today. -Alvis Lemmings Tommi Rumps) and [facility] Corene Cornea) [place] unable to accept patient for [facility] due to living in [place] Malachy Mood), [facility] ([place]) and Blanca Friend Mortimer Fries) [place] unable to accept [facility] Ren) stated she checked with her Admin on call and they do service [place], but are unable to staff a PT referral.  --Updated patient about being unable to find [facility] coverage. Patient states she will follow up with her PCP. Provided CSW number for patient per her request.  -Called [facility] [phone]. Spoke with Olin Hauser who stated they will have availability for HHPT, start of care on Wednesday. Updated patient. Patient states she sees Lucky Cowboy at [facility]- updated chart. Will fax [place] orders, H&P, PT Notes, Face sheet, and DC Summary to [phone] when available.   2:25- Requested information faxed to [facility].  4:31- Call to [employer], spoke with [facility] and inquired status of RW delivery as patient is waiting on this to DC. Jasmine stated order was never sent to driver, she can send to driver now and should be delivered within 1 hour. Updated RN and MD.     Final next level of care: [place] [facility] Barriers to Discharge: Barriers Resolved   Patient Goals and CMS Choice CMS Medicare.gov Compare Post Acute Care list provided to:: Patient Choice offered to / list presented to : Patient  Discharge Placement                  Patient to be  transferred to facility by: daughter to transport home Name of family member notified: patient Patient and family notified of of transfer: 07/26/22  Discharge Plan and Services Additional resources added to the After Visit Summary for                  DME Arranged: [place] rolling DME Agency: [employer] Date DME Agency Contacted: 07/26/22   Representative spoke with at DME Agency: [place]: PT [place]: [place] Date [place]: 07/26/22   Representative spoke with at [place]: [place] ([place]) Interventions [place]: Food Insecurity Present (07/23/2022)  Housing: Low Risk  (07/23/2022)  Transportation Needs: No Transportation Needs (07/23/2022)  Utilities: Not At Risk (07/23/2022)  Alcohol Screen: Low Risk  (03/03/2021)  Depression (PHQ2-9): High Risk (03/03/2021)  Financial Resource Strain: Low Risk  (12/26/2019)  Physical Activity: Inactive (12/26/2019)  Social Connections: Socially Isolated (12/26/2019)  Stress: Stress Concern Present (12/26/2019)  Tobacco Use: Medium Risk (07/25/2022)     Readmission Risk Interventions    02/01/2020    3:17 PM  Readmission Risk Prevention Plan  Transportation Screening Complete  Medication Review ([place]) Complete  PCP or Specialist appointment within 3-5 days of discharge Complete  HRI or [place] Complete  SW Recovery Care/Counseling Consult Complete  [place] Not Applicable

## 2022-07-26 NOTE — Progress Notes (Signed)
[facility]  ROUNDING NOTE   Subjective:   [name] 67 y.o. female with end-stage renal disease on hemodialysis, diabetes type 2, hypertension, coronary disease status post stent placement, history of MI in 2017, peripheral arterial disease, hyperlipidemia, GERD, chronic combined systolic and diastolic CHF, gout, asthma, hepatitis C, anemia, history of colostomy was admitted for ESRD (end stage renal disease) on dialysis ([place]) [N18.6, Z99.2] Cellulitis of left lower extremity [L03.116]  Appreciate renal navigator confirming outpatient dialysis clinic of [facility] [place] on a MWF schedule.  Patient seen resting in bed, alert and oriented Will room air States she feels well today Mild lower extremity edema   Objective:  Vital signs in last 24 hours:  Temp:  [97.5 F (36.4 C)-98.3 F (36.8 C)] 98 F (36.7 C) (01/14 0444) Pulse Rate:  [55-74] 64 (01/14 0444) Resp:  [12-18] 16 (01/14 0444) BP: (120-154)/(43-117) 120/57 (01/14 0444) SpO2:  [93 %-99 %] 99 % (01/14 0444) Weight:  [125.2 kg-127.4 kg] 125.2 kg (01/13 1749)  Weight change:  Filed Weights   07/22/22 2211 07/25/22 1338 07/25/22 1749  Weight: 117.9 kg 127.4 kg 125.2 kg    Intake/Output: I/O last 3 completed shifts: In: 831.2 [P.O.:360; I.V.:121.2; IV Piggyback:350] Out: 2500 [Other:2500]   Intake/Output this shift:  No intake/output data recorded.  Physical Exam: General: NAD  Head: Normocephalic, atraumatic. Moist oral mucosal membranes  Eyes: Anicteric  Lungs:  Clear to auscultation, normal effort  Heart: Regular rate and rhythm  Abdomen:  Soft, nontender, obese  Extremities: Trace peripheral edema.  Neurologic: Alert and oriented moving all four extremities  Skin: No lesions  Access: Left aVF    Basic Metabolic Panel: Recent Labs  Lab 07/22/22 2213 07/23/22 0645 07/24/22 0549 07/25/22 0500 07/26/22 0733  NA 134* 132* 134* 131* 132*  K 5.3* 5.0 4.4 4.2 3.8  CL 92* 94* 95*  93* 95*  CO2 26 21* 23 21* 25  GLUCOSE 292* 149* 265* 111* 137*  BUN 84* 84* 60* 75* 46*  CREATININE 12.47* 12.64* 8.54* 10.47* 7.19*  CALCIUM 8.4* 8.2* 8.5* 8.6* 8.2*  MG  --   --  2.1 2.1 1.9  PHOS  --   --  7.1* 8.3* 5.5*     Liver Function Tests: No results for input(s): "AST", "ALT", "ALKPHOS", "BILITOT", "PROT", "ALBUMIN" in the last 168 hours. No results for input(s): "LIPASE", "AMYLASE" in the last 168 hours. No results for input(s): "AMMONIA" in the last 168 hours.  CBC: Recent Labs  Lab 07/22/22 2213 07/23/22 0645 07/23/22 2245 07/24/22 0549 07/25/22 0500 07/26/22 0733  WBC 5.2 4.8  --  7.1 6.6 6.1  HGB 7.9* 7.0* 9.4* 9.2* 8.6* 8.4*  HCT 26.8* 23.6* 29.7* 29.5* 27.2* 27.1*  MCV 80.7 81.4  --  79.5* 78.4* 79.9*  PLT 188 184  --  231 222 188     Cardiac Enzymes: No results for input(s): "CKTOTAL", "CKMB", "CKMBINDEX", "TROPONINI" in the last 168 hours.  BNP: Invalid input(s): "POCBNP"  CBG: Recent Labs  Lab 07/24/22 2147 07/25/22 0600 07/25/22 0934 07/25/22 1140 07/25/22 2143  GLUCAP 286* 155* 110* 122* 60*     Microbiology: Results for orders placed or performed during the hospital encounter of 07/23/22  Blood culture (routine x 2)     Status: None (Preliminary result)   Collection Time: 07/23/22  1:41 AM   Specimen: BLOOD RIGHT ARM  Result Value Ref Range Status   Specimen Description BLOOD RIGHT ARM  Final   Special Requests  Final    BOTTLES DRAWN AEROBIC AND ANAEROBIC Blood Culture adequate volume   Culture   Final    NO GROWTH 3 DAYS Performed at [facility], [address] [place], [place] [address]    Report Status PENDING  Incomplete  Blood culture (routine x 2)     Status: None (Preliminary result)   Collection Time: 07/23/22  1:41 AM   Specimen: BLOOD RIGHT ARM  Result Value Ref Range Status   Specimen Description BLOOD RIGHT ARM  Final   Special Requests   Final    BOTTLES DRAWN AEROBIC AND ANAEROBIC Blood  Culture adequate volume   Culture   Final    NO GROWTH 3 DAYS Performed at [facility], [address], [place], [place] [address]    Report Status PENDING  Incomplete  MRSA Next Gen by PCR, Nasal     Status: None   Collection Time: 07/23/22  2:51 PM   Specimen: Nasal Mucosa; Nasal Swab  Result Value Ref Range Status   MRSA by PCR Next Gen NOT DETECTED NOT DETECTED Final    Comment: (NOTE) The GeneXpert MRSA Assay (FDA approved for NASAL specimens only), is one component of a comprehensive MRSA colonization surveillance program. It is not intended to diagnose MRSA infection nor to guide or monitor treatment for MRSA infections. Test performance is not FDA approved in patients less than 8 years old. Performed at [facility], [address] [place], [place] [address]    *Note: Due to a large number of results and/or encounters for the requested time period, some results have not been displayed. A complete set of results can be found in Results Review.    Coagulation Studies: No results for input(s): "LABPROT", "INR" in the last 72 hours.  Urinalysis: No results for input(s): "COLORURINE", "LABSPEC", "PHURINE", "GLUCOSEU", "HGBUR", "BILIRUBINUR", "KETONESUR", "PROTEINUR", "UROBILINOGEN", "NITRITE", "LEUKOCYTESUR" in the last 72 hours.  Invalid input(s): "APPERANCEUR"    Imaging: DG HIP UNILAT WITH PELVIS 2-3 VIEWS RIGHT  Result Date: 07/26/2022 CLINICAL DATA:  Chronic pain without injury. EXAM: DG HIP (WITH OR WITHOUT PELVIS) 2-3V RIGHT COMPARISON:  Plain film of the RIGHT hip dated 05/15/2020. FINDINGS: Osseous alignment is normal. No fracture line or displaced fracture fragment is seen. No focal cortical irregularity or osseous lesion, however, there is heterogeneous mineralization of the RIGHT femoral head. No degenerative osteoarthritic changes or discrete osseous erosion. Curvilinear density within the soft tissues lateral to the RIGHT iliac bone.  Vascular calcifications are seen within the central pelvis and upper thighs bilaterally. Soft tissues about the RIGHT hip and RIGHT hemipelvis are otherwise unremarkable. IMPRESSION: 1. Heterogeneous mineralization of the RIGHT femoral head, possibly indicating underlying avascular necrosis. However, no articular surface flattening or other confirming signs of an underlying avascular necrosis. Consider nonemergent MRI for further characterization. 2. Curvilinear density within the soft tissues lateral to the RIGHT iliac bone, most likely a chronic calcific tendinopathy of the flexor or abductor groups, and a possible source of patient's symptoms. 3. No acute findings. 4. Peripheral vascular disease. Electronically Signed   By: Franki Cabot M.D.   On: 07/26/2022 09:37     Medications:    piperacillin-tazobactam 2.25 g (07/26/22 1054)   vancomycin Stopped (07/25/22 1730)    allopurinol  100 mg Oral Once per day on Tue Thu Sat   aspirin EC  81 mg Oral Daily   atorvastatin  80 mg Oral QPM   bisacodyl  10 mg Oral QHS   carvedilol  12.5 mg Oral BID WC   Chlorhexidine Gluconate Cloth  6 each Topical Q0600   vitamin B-12  500 mcg Oral Daily   fluticasone furoate-vilanterol  1 puff Inhalation Daily   folic acid  1 mg Oral Daily   insulin aspart  0-5 Units Subcutaneous QHS   insulin aspart  0-9 Units Subcutaneous TID WC   insulin NPH Human  15 Units Subcutaneous BID AC & HS   midodrine  10 mg Oral Q lunch   naloxone  1 spray Nasal UD   oxyCODONE  10 mg Oral Q6H   pantoprazole  40 mg Oral BID   polyethylene glycol  17 g Oral Daily   predniSONE  20 mg Oral Q breakfast   pregabalin  50 mg Oral BID   sucroferric oxyhydroxide  1,500 mg Oral TID   valACYclovir  500 mg Oral Daily   acetaminophen **OR** acetaminophen, albuterol, fentaNYL (SUBLIMAZE) injection, fluticasone, hydrOXYzine, methocarbamol, metoCLOPramide (REGLAN) injection, nitroGLYCERIN, sodium chloride, traZODone  Assessment/ Plan:  Ms.  [name] is a 67 y.o.  female end-stage renal disease on hemodialysis, diabetes type 2, hypertension, coronary disease status post stent placement, history of MI in 2017, peripheral arterial disease, hyperlipidemia, GERD, chronic combined systolic and diastolic CHF, gout, asthma, hepatitis C, anemia, history of colostomy was admitted for ESRD (end stage renal disease) on dialysis ([place]) [N18.6, Z99.2] Cellulitis of left lower extremity [N98.921]  [facility] [place]/MWF/left aVF  End-stage renal disease on hemodialysis.  Patient received dialysis treatment yesterday, UF 2.5 L achieved.  Next treatment scheduled for Monday.  2. Anemia of chronic kidney disease Lab Results  Component Value Date   HGB 8.4 (L) 07/26/2022    Patient receives Violet outpatient.  Hemoglobin below target.  Will monitor for now.  3. Secondary Hyperparathyroidism: with outpatient labs: PTH 664, phosphorus 6.7, calcium 9.0 on 07/16/2022.   Lab Results  Component Value Date   PTH 232 (H) 08/16/2017   CALCIUM 8.2 (L) 07/26/2022   CAION 0.86 (LL) 05/26/2022   PHOS 5.5 (H) 07/26/2022    Patient currently prescribed vitamin D supplementation and Auryxia with meals outpatient.  All remain healed.  PTH pending.  Bone minerals remain acceptable.  Will consider restarting binders if inpatient status is extended.  4.  Hypertension with chronic kidney disease.  Home regimen includes carvedilol and midodrine.  Both currently prescribed.  Blood pressure remains acceptable.   LOS: [place] 1/14/202410:57 AM

## 2022-07-26 NOTE — Discharge Summary (Signed)
Physician Discharge Summary  NAW LASALA YSA:[id number] DOB: 25-Jul-1955 DOA: 07/23/2022  PCP: Beverley Fiedler, FNP  Admit date: 07/23/2022 Discharge date: 07/26/2022  Admitted From: Home Disposition:  Home  Recommendations for Outpatient Follow-up:  Follow up with PCP in 1-2 weeks Dialysis as a scheduled on MWF Start doxycycline twice daily for 7 days for cellulitis prednisone for 4 more days for gout flare Follow-up with PCP for right hip pain Continue home health physical therapy  Home Health: yes Equipment/Devices: Rollator walker Discharge Condition: Stable CODE STATUS: Full code Diet recommendation: Low-carb diet/renal diet  Brief/Interim Summary: JADE BURKARD is a 67 y.o. African-American female with medical history significant for ESRD on HD on TTS, DM2, GERD, CAD, status post PCI and stent, diastolic dysfunction, depression, gout, hepatitis C and hypertension, who presented to the emergency room with acute onset of left heel and leg pain which has been intermittent over the last couple of months.  She noticed worsening redness of the heel with associated tenderness and pain and extension to her lower leg. [place] and prescribed p.o. Bactrim and clindamycin for cellulitis of the left leg.    ED workup, imaging: Chest x-Bogdon showed mild left infrahilar right basilar linear atelectasis.  Left foot x-Greek showed mild to moderate severity plantar soft tissue swelling.  CT of the left lower extremity revealed the following: 1. Subcutaneous edema within the left hindfoot extending superiorly to the level of the mid calf. No discrete drainable subcutaneous fluid collection identified. No infiltration of the intrafascial fat of the deep compartments of the left lower extremity. No subcutaneous gas identified. 2. Infiltration within the medial subcutaneous fat of the left thigh at the level of the distal diaphysis of the femur with associated coarse  calcification likely postsurgical or posttraumatic in nature. 3. Mild-to-moderate degenerative arthritis within the left knee, most severe within the medial compartment. Small left knee effusion.   The patient was given a gram of IV vancomycin and Zosyn and 4 mg of IV morphine sulfate.  She will be admitted to a medical bed for further evaluation and management.   Cellulitis of left lower extremity, left ankle joint and left heel blister -Her symptoms improved significantly.  Remained afebrile.  No leukocytosis. -Will received Vanco and Zosyn while in the hospital.  Will discharge home on doxycycline 100 mg twice daily for 7 more days.   Gout flareup left ankle joint -Uric acid 9.9 elevated -Started Solu-Medrol 40 mg IV daily -switched to oral prednisone-will discharge on oral prednisone for 4 more days  Anemia secondary to GI bleeding, FOBT positive.  Patient was complaining of dark stools -Hb 7.0, patient received 1 unit of transfusion on 1/11 -EGD/colonoscopy unremarkable per GI.  No further GI intervention recommended.  GI recommended to follow-up with GI outpatient.  Will discharge home on -H&H remained stable.  Folic acid level 7.3, at lower end, started folic acid 1 mg p.o. daily follow with PCP to repeat folic acid level after 3 months. Vitamin B12 level 373, goal >400--500, started oral supplement.,  Continue for 3 months.    ESRD on hemod[facility]) -Nephrology consulted for HD TTS schedule.   -Okay to discharge from nephrology standpoint  Essential hypertension - continued home antihypertensives.  Type 2 diabetes mellitus with chronic kidney disease, with long-term current use of insulin (HCC) -Resume home insulin regimen at the time of discharge.  Recommend to follow-up with PCP   Peripheral neuropathy -continued Lyrica.   GERD  without esophagitis - continued PPI therapy.  Right hip pain: -Going on since 1 week.  X-Kushner obtained that showed possible indicating  avascular necrosis however no articular surface flattening or other confirming signs of an underlying avascular necrosis.  Consider nonemergent MRI for further characterization.  Curvilinear density within the soft tissues lateral to the RIGHT iliac bone, most likely a chronic calcific tendinopathy of the flexor or abductor groups, and a possible source of patient's symptoms. No acute findings. -Follow-up with PCP outpatient   Morbid obesity, Body mass index is 39.53 kg/m.  Interventions: Calorie restricted diet and daily exercise advised to lose body weight.  Discharge Diagnoses:  Cellulitis of left lower extremity, left ankle joint and left heel blister Gout flareup left ankle joint Anemia secondary to GI bleeding ESRD on hemodialysis Essential hypertension Type 2 diabetes mellitus with CKD with long-term current use of insulin Peripheral neuropathy GERD with out esophagitis Morbid obesity Right hip pain  Discharge Instructions   Allergies as of 07/26/2022       Reactions   Morphine Itching   Tolerated hydromorphone on 07/2020 *will take along with Benadryl*   Shellfish Allergy Anaphylaxis, Swelling   Diazepam Other (See Comments)   "felt like out of body experience"        Medication List     TAKE these medications    albuterol 108 (90 Base) MCG/ACT inhaler Commonly known as: VENTOLIN HFA Inhale 2 puffs into the lungs every 4 (four) hours as needed for wheezing or shortness of breath.   allopurinol 100 MG tablet Commonly known as: ZYLOPRIM TAKE 1 TABLET BY MOUTH ONCE DAILY.   aspirin EC 81 MG tablet Take 1 tablet (81 mg total) by mouth daily.   atorvastatin 80 MG tablet Commonly known as: LIPITOR Take 1 tablet (80 mg total) by mouth every evening.   Blood Glucose Test Strips 333 Strp Enough test stripes to test blood sugar TID x 30 days. No refills   carvedilol 12.5 MG tablet Commonly known as: COREG Take 1 tablet (12.5 mg total) by mouth 2 (two) times  daily with a meal.   doxycycline 100 MG EC tablet Commonly known as: DORYX Take 1 tablet (100 mg total) by mouth 2 (two) times daily for 7 days.   fluticasone 50 MCG/ACT nasal spray Commonly known as: FLONASE Place 2 sprays into both nostrils daily as needed for allergies or rhinitis.   fluticasone furoate-vilanterol 200-25 MCG/INH Aepb Commonly known as: BREO ELLIPTA Inhale 1 puff into the lungs daily.   FreeStyle Libre 14 Day Reader Kerrin Mo To check blood sugar ACHS   FreeStyle Libre 14 Day Sensor Misc To check blood sugar ACHS; change every 14 days   HumuLIN 70/30 KwikPen (70-30) 100 UNIT/ML KwikPen Generic drug: insulin isophane & regular human KwikPen Inject 30 Units into the skin 2 (two) times daily.   hydrOXYzine 25 MG tablet Commonly known as: ATARAX Take 1 tablet (25 mg total) by mouth 4 (four) times daily. What changed:  when to take this reasons to take this   Insulin Pen Needle 32G X 4 MM Misc Enough pen needles for 100 units of novolin QAM x 30 days. No refills   Levemir FlexPen 100 UNIT/ML FlexPen Generic drug: insulin detemir Inject 60 Units into the skin daily.   methocarbamol 500 MG tablet Commonly known as: ROBAXIN Take 1 tablet (500 mg total) by mouth 2 (two) times daily.   midodrine 10 MG tablet Commonly known as: PROAMATINE Take 10 mg by mouth daily.  naloxone 4 MG/0.1ML Liqd nasal spray kit Commonly known as: NARCAN Place 1 spray into the nose as directed.   nitroGLYCERIN 0.4 MG SL tablet Commonly known as: NITROSTAT Place 1 tablet (0.4 mg total) under the tongue every 5 (five) minutes x 3 doses as needed for chest pain.   NovoLIN N FlexPen 100 UNIT/ML Kiwkpen Generic drug: Insulin NPH (Human) (Isophane) Inject 100 Units into the skin every morning.   Oxycodone HCl 10 MG Tabs Take 10 mg by mouth every 6 (six) hours.   predniSONE 20 MG tablet Commonly known as: DELTASONE Take 1 tablet (20 mg total) by mouth daily with breakfast for 4  days. Start taking on: July 27, 2022   pregabalin 50 MG capsule Commonly known as: LYRICA Take 50 mg by mouth 2 (two) times daily. What changed: Another medication with the same name was removed. Continue taking this medication, and follow the directions you see here.   valACYclovir 500 MG tablet Commonly known as: VALTREX TAKE (1) TABLET BY MOUTH EVERY OTHER DAY. What changed: See the new instructions.   Velphoro 500 MG chewable tablet Generic drug: sucroferric oxyhydroxide Chew 1,500 mg by mouth 3 (three) times daily.               Durable Medical Equipment  (From admission, onward)           Start     Ordered   07/26/22 1105  For home use only DME Walker rolling  Once       Question Answer Comment  Walker: With 5 Inch Wheels   Patient needs a walker to treat with the following condition Pain      07/26/22 1104            Allergies  Allergen Reactions   Morphine Itching    Tolerated hydromorphone on 07/2020 *will take along with Benadryl*   Shellfish Allergy Anaphylaxis and Swelling   Diazepam Other (See Comments)    "felt like out of body experience"    Consultations: GI Nephrology   Procedures/Studies: DG HIP UNILAT WITH PELVIS 2-3 VIEWS RIGHT  Result Date: 07/26/2022 CLINICAL DATA:  Chronic pain without injury. EXAM: DG HIP (WITH OR WITHOUT PELVIS) 2-3V RIGHT COMPARISON:  Plain film of the RIGHT hip dated 05/15/2020. FINDINGS: Osseous alignment is normal. No fracture line or displaced fracture fragment is seen. No focal cortical irregularity or osseous lesion, however, there is heterogeneous mineralization of the RIGHT femoral head. No degenerative osteoarthritic changes or discrete osseous erosion. Curvilinear density within the soft tissues lateral to the RIGHT iliac bone. Vascular calcifications are seen within the central pelvis and upper thighs bilaterally. Soft tissues about the RIGHT hip and RIGHT hemipelvis are otherwise unremarkable.  IMPRESSION: 1. Heterogeneous mineralization of the RIGHT femoral head, possibly indicating underlying avascular necrosis. However, no articular surface flattening or other confirming signs of an underlying avascular necrosis. Consider nonemergent MRI for further characterization. 2. Curvilinear density within the soft tissues lateral to the RIGHT iliac bone, most likely a chronic calcific tendinopathy of the flexor or abductor groups, and a possible source of patient's symptoms. 3. No acute findings. 4. Peripheral vascular disease. Electronically Signed   By: Franki Cabot M.D.   On: 07/26/2022 09:37   CT EXTREMITY LOWER LEFT W CONTRAST  Result Date: 07/23/2022 CLINICAL DATA:  Diabetes, infected left heel wound, ascending left lower extremity infection EXAM: CT OF THE LOWER LEFT EXTREMITY WITH CONTRAST TECHNIQUE: Multidetector CT imaging of the lower left extremity was performed according  to the standard protocol following intravenous contrast administration. RADIATION DOSE REDUCTION: This exam was performed according to the departmental dose-optimization program which includes automated exposure control, adjustment of the mA and/or kV according to patient size and/or use of iterative reconstruction technique. CONTRAST:  113m OMNIPAQUE IOHEXOL 350 MG/ML SOLN COMPARISON:  None Available. FINDINGS: Bones/Joint/Cartilage Normal alignment. No acute fracture or dislocation. No osseous erosions or abnormal periosteal reaction. Mild-to-moderate degenerative arthritis within the left knee, most severe within the medial compartment. Remaining joint spaces are preserved. Ligaments Suboptimally assessed by CT. Muscles and Tendons Intramuscular lipoma within the left adductor magnus muscle belly measuring 3.5 x 1.5 cm. Otherwise unremarkable. Soft tissues Visualized abdominopelvic contents are unremarkable. Probable 21 mm left Bartholin gland cyst. Mild nonspecific subcutaneous edema involving the anterolateral left thigh.  Infiltration within the medial subcutaneous fat of the left thigh at the level of the distal diaphysis of the femur with associated coarse calcification likely postsurgical or posttraumatic in nature. There is subcutaneous edema within the left hindfoot extending superiorly to the level of the mid calf. No discrete drainable subcutaneous fluid collection identified. Inflammatory stranding appears confined to the subcutaneous compartment and there is no infiltration of the intrafascial fat of the deep compartments of the left lower extremity. No subcutaneous gas identified. Small left knee effusion. IMPRESSION: 1. Subcutaneous edema within the left hindfoot extending superiorly to the level of the mid calf. No discrete drainable subcutaneous fluid collection identified. No infiltration of the intrafascial fat of the deep compartments of the left lower extremity. No subcutaneous gas identified. 2. Infiltration within the medial subcutaneous fat of the left thigh at the level of the distal diaphysis of the femur with associated coarse calcification likely postsurgical or posttraumatic in nature. 3. Mild-to-moderate degenerative arthritis within the left knee, most severe within the medial compartment. Small left knee effusion. Electronically Signed   By: AFidela SalisburyM.D.   On: 07/23/2022 02:31   DG Chest Port 1 View  Result Date: 07/22/2022 CLINICAL DATA:  Missed hemodialysis. EXAM: PORTABLE CHEST 1 VIEW COMPARISON:  May 26, 2022 FINDINGS: The heart size and mediastinal contours are within normal limits. Mild linear atelectasis is seen within the left infrahilar region and lateral aspect of the right lung base. There is no evidence of an acute infiltrate, pleural effusion or pneumothorax. The visualized skeletal structures are unremarkable. IMPRESSION: Mild left infrahilar and right basilar linear atelectasis. Electronically Signed   By: TVirgina NorfolkM.D.   On: 07/22/2022 23:35   DG Foot Complete  Left  Result Date: 07/22/2022 CLINICAL DATA:  Left foot pain. EXAM: LEFT FOOT - COMPLETE 3+ VIEW COMPARISON:  None Available. FINDINGS: There is no evidence of an acute fracture or dislocation. There is no evidence of arthropathy or other focal bone abnormality. Mild to moderate severity plantar soft tissue swelling is seen. IMPRESSION: Mild to moderate severity plantar soft tissue swelling. Electronically Signed   By: TVirgina NorfolkM.D.   On: 07/22/2022 23:34      Subjective: Patient seen and examined.  Resting comfortably on the bed.  Reports improvement in pain overall.  Wishes and eager to go home today.  Remained afebrile.  No acute events overnight.  Has right hip pain that has been going on for more than a week.  No fall or injuries.  Recommended patient to follow-up with PCP outpatient.  Home health PT/OT arranged at the time of discharge  Discharge Exam: Vitals:   07/25/22 2141 07/26/22 0444  BP: (!) 127/43 ([name]  120/57  Pulse: 74 64  Resp: 18 16  Temp: 97.9 F (36.6 C) 98 F (36.7 C)  SpO2: 93% 99%   Vitals:   07/25/22 1749 07/25/22 1808 07/25/22 2141 07/26/22 0444  BP:  (!) 136/51 (!) 127/43 (!) 120/57  Pulse:  65 74 64  Resp:   18 16  Temp:  97.8 F (36.6 C) 97.9 F (36.6 C) 98 F (36.7 C)  TempSrc:  Oral Oral Oral  SpO2:  98% 93% 99%  Weight: 125.2 kg     Height:        General: Pt is alert, awake, not in acute distress Cardiovascular: RRR, S1/S2 +, no rubs, no gallops Respiratory: CTA bilaterally, no wheezing, no rhonchi Abdominal: Soft, NT, ND, bowel sounds + Extremities: Left lower extremity: Mild ankle swelling.  Left heel blister healing well.  Dressing dry and intact.  None erythematous, nontender, no bleeding or discharge seen.   The results of significant diagnostics from this hospitalization (including imaging, microbiology, ancillary and laboratory) are listed below for reference.     Microbiology: Recent Results (from the past 240 hour(s))   Blood culture (routine x 2)     Status: None (Preliminary result)   Collection Time: 07/23/22  1:41 AM   Specimen: BLOOD RIGHT ARM  Result Value Ref Range Status   Specimen Description BLOOD RIGHT ARM  Final   Special Requests   Final    BOTTLES DRAWN AEROBIC AND ANAEROBIC Blood Culture adequate volume   Culture   Final    NO GROWTH 3 DAYS Performed at [facility], [address] [place], [place] [address]    Report Status PENDING  Incomplete  Blood culture (routine x 2)     Status: None (Preliminary result)   Collection Time: 07/23/22  1:41 AM   Specimen: BLOOD RIGHT ARM  Result Value Ref Range Status   Specimen Description BLOOD RIGHT ARM  Final   Special Requests   Final    BOTTLES DRAWN AEROBIC AND ANAEROBIC Blood Culture adequate volume   Culture   Final    NO GROWTH 3 DAYS Performed at [facility], [address], [place], [place] [address]    Report Status PENDING  Incomplete  MRSA Next Gen by PCR, Nasal     Status: None   Collection Time: 07/23/22  2:51 PM   Specimen: Nasal Mucosa; Nasal Swab  Result Value Ref Range Status   MRSA by PCR Next Gen NOT DETECTED NOT DETECTED Final    Comment: (NOTE) The GeneXpert MRSA Assay (FDA approved for NASAL specimens only), is one component of a comprehensive MRSA colonization surveillance program. It is not intended to diagnose MRSA infection nor to guide or monitor treatment for MRSA infections. Test performance is not FDA approved in patients less than 18 years old. Performed at [facility], [address] [place], [place] [address]      Labs: BNP (last 3 results) Recent Labs    04/14/22 2342  BNP 073.7*   Basic Metabolic Panel: Recent Labs  Lab 07/22/22 2213 07/23/22 0645 07/24/22 0549 07/25/22 0500 07/26/22 0733  NA 134* 132* 134* 131* 132*  K 5.3* 5.0 4.4 4.2 3.8  CL 92* 94* 95* 93* 95*  CO2 26 21* 23 21* 25  GLUCOSE 292* 149* 265* 111* 137*  BUN 84* 84* 60* 75* 46*   CREATININE 12.47* 12.64* 8.54* 10.47* 7.19*  CALCIUM 8.4* 8.2* 8.5* 8.6* 8.2*  MG  --   --  2.1  2.1 1.9  PHOS  --   --  7.1* 8.3* 5.5*   Liver Function Tests: No results for input(s): "AST", "ALT", "ALKPHOS", "BILITOT", "PROT", "ALBUMIN" in the last 168 hours. No results for input(s): "LIPASE", "AMYLASE" in the last 168 hours. No results for input(s): "AMMONIA" in the last 168 hours. CBC: Recent Labs  Lab 07/22/22 2213 07/23/22 0645 07/23/22 2245 07/24/22 0549 07/25/22 0500 07/26/22 0733  WBC 5.2 4.8  --  7.1 6.6 6.1  HGB 7.9* 7.0* 9.4* 9.2* 8.6* 8.4*  HCT 26.8* 23.6* 29.7* 29.5* 27.2* 27.1*  MCV 80.7 81.4  --  79.5* 78.4* 79.9*  PLT 188 184  --  231 222 188   Cardiac Enzymes: No results for input(s): "CKTOTAL", "CKMB", "CKMBINDEX", "TROPONINI" in the last 168 hours. BNP: Invalid input(s): "POCBNP" CBG: Recent Labs  Lab 07/25/22 0600 07/25/22 0934 07/25/22 1140 07/25/22 2143 07/26/22 1141  GLUCAP 155* 110* 122* 219* 158*   D-Dimer No results for input(s): "DDIMER" in the last 72 hours. Hgb A1c No results for input(s): "HGBA1C" in the last 72 hours. Lipid Profile No results for input(s): "CHOL", "HDL", "LDLCALC", "TRIG", "CHOLHDL", "LDLDIRECT" in the last 72 hours. Thyroid function studies No results for input(s): "TSH", "T4TOTAL", "T3FREE", "THYROIDAB" in the last 72 hours.  Invalid input(s): "FREET3" Anemia work up No results for input(s): "VITAMINB12", "FOLATE", "FERRITIN", "TIBC", "IRON", "RETICCTPCT" in the last 72 hours. Urinalysis    Component Value Date/Time   COLORURINE YELLOW 03/05/2021 1425   APPEARANCEUR HAZY (A) 03/05/2021 1425   LABSPEC 1.012 03/05/2021 1425   PHURINE 5.0 03/05/2021 1425   GLUCOSEU >=500 (A) 03/05/2021 1425   HGBUR NEGATIVE 03/05/2021 1425   BILIRUBINUR NEGATIVE 03/05/2021 1425   BILIRUBINUR neg 12/17/2016 1538   KETONESUR NEGATIVE 03/05/2021 1425   PROTEINUR 30 (A) 03/05/2021 1425   UROBILINOGEN 0.2 12/17/2016 1538    UROBILINOGEN 0.2 05/29/2010 2100   NITRITE NEGATIVE 03/05/2021 1425   LEUKOCYTESUR TRACE (A) 03/05/2021 1425   Sepsis Labs Recent Labs  Lab 07/23/22 0645 07/24/22 0549 07/25/22 0500 07/26/22 0733  WBC 4.8 7.1 6.6 6.1   Microbiology Recent Results (from the past 240 hour(s))  Blood culture (routine x 2)     Status: None (Preliminary result)   Collection Time: 07/23/22  1:41 AM   Specimen: BLOOD RIGHT ARM  Result Value Ref Range Status   Specimen Description BLOOD RIGHT ARM  Final   Special Requests   Final    BOTTLES DRAWN AEROBIC AND ANAEROBIC Blood Culture adequate volume   Culture   Final    NO GROWTH 3 DAYS Performed at [facility], [address] [place], [place] [address]    Report Status PENDING  Incomplete  Blood culture (routine x 2)     Status: None (Preliminary result)   Collection Time: 07/23/22  1:41 AM   Specimen: BLOOD RIGHT ARM  Result Value Ref Range Status   Specimen Description BLOOD RIGHT ARM  Final   Special Requests   Final    BOTTLES DRAWN AEROBIC AND ANAEROBIC Blood Culture adequate volume   Culture   Final    NO GROWTH 3 DAYS Performed at [facility], [address] [place], [place] [address]    Report Status PENDING  Incomplete  MRSA Next Gen by PCR, Nasal     Status: None   Collection Time: 07/23/22  2:51 PM   Specimen: Nasal Mucosa; Nasal Swab  Result Value Ref Range Status   MRSA by PCR Next Gen NOT  DETECTED NOT DETECTED Final    Comment: (NOTE) The GeneXpert MRSA Assay (FDA approved for NASAL specimens only), is one component of a comprehensive MRSA colonization surveillance program. It is not intended to diagnose MRSA infection nor to guide or monitor treatment for MRSA infections. Test performance is not FDA approved in patients less than 63 years old. Performed at [facility], [address] [place], [place] [address]      Time coordinating discharge: Over 30 minutes  SIGNED:   Mckinley Jewel, MD  [facility] 07/26/2022, 12:15 PM Pager   If 7PM-7AM, please contact night-coverage [email or web address]

## 2022-07-27 ENCOUNTER — Encounter: Payer: Self-pay | Admitting: Gastroenterology

## 2022-07-27 LAB — HEPATITIS B E ANTIBODY: Hep B E Ab: NEGATIVE

## 2022-07-27 LAB — PARATHYROID HORMONE, INTACT (NO CA): PTH: 575 pg/mL — ABNORMAL HIGH (ref 15–65)

## 2022-07-28 LAB — CULTURE, BLOOD (ROUTINE X 2)
Culture: NO GROWTH
Culture: NO GROWTH
Special Requests: ADEQUATE
Special Requests: ADEQUATE

## 2022-07-28 LAB — VITAMIN D 25 HYDROXY (VIT D DEFICIENCY, FRACTURES)

## 2022-07-28 LAB — HEPATITIS B SURFACE ANTIGEN

## 2022-07-30 NOTE — Anesthesia Postprocedure Evaluation (Signed)
Anesthesia Post Note  Patient: [name]  Procedure(s) Performed: ESOPHAGOGASTRODUODENOSCOPY (EGD) WITH PROPOFOL COLONOSCOPY WITH PROPOFOL  Patient location during evaluation: Endoscopy Anesthesia Type: General Level of consciousness: awake and alert Pain management: pain level controlled Vital Signs Assessment: post-procedure vital signs reviewed and stable Respiratory status: spontaneous breathing, nonlabored ventilation, respiratory function stable and patient connected to nasal cannula oxygen Cardiovascular status: blood pressure returned to baseline and stable Postop Assessment: no apparent nausea or vomiting Anesthetic complications: no   No notable events documented.   Last Vitals:  Vitals:   07/26/22 1220 07/26/22 1714  BP: (!) 118/41 (!) 141/73  Pulse: 69 72  Resp: 16 16  Temp: 36.8 C 37 C  SpO2: 94% 96%    Last Pain:  Vitals:   07/26/22 1035  TempSrc:   PainSc: 4                  Martha Clan

## 2022-08-10 ENCOUNTER — Ambulatory Visit: Payer: Medicare Other | Admitting: Internal Medicine

## 2022-08-10 NOTE — Progress Notes (Deleted)
Patient ID: [name], female   DOB: 10-27-1955, 67 y.o.   MRN: [id number]  HPI: [name] is a 67 y.o.-year-old female, returning for follow-up for DM2, dx in 1990, insulin-dependent since 2002, uncontrolled, with complications (ESRD - on HD; CAD; CHF; aortic stenosis; PAD; DR; PN; h/o cellulitis and necrotizing fasciitis). Pt. previously saw Dr. Loanne Drilling, last visit 10/2021.  She had multiple admissions since last visit with Dr. Loanne Drilling: Admitted 07/23/2022 for cellulitis In ED with hyperglycemia and blister of left heel 07/21/2022 In ED with colitis 07/19/2022 In the ED with cellulitis 05/31/2022 In ED with uremia 05/28/2022 Admitted 04/15/2022 with shortness of breath, URI, missed dialysis  Reviewed HbA1c: Lab Results  Component Value Date   HGBA1C 8.8 (H) 07/23/2022   HGBA1C 8.1 (A) 10/28/2021   HGBA1C 10.7 (A) 07/23/2021   HGBA1C 12.8 (H) 04/21/2021   HGBA1C 12.9 (A) 03/03/2021   HGBA1C 6.8 (H) 09/27/2020   HGBA1C 8.4 (H) 09/13/2020   HGBA1C 10.0 (H) 01/30/2020   HGBA1C 8.7 (H) 01/16/2020   HGBA1C 9.6 (H) 12/28/2018   Pt is on a regimen of: - NPH 100 units in am  Pt checks her sugars *** a day and they are: - am: n/c - 2h after b'fast: n/c - before lunch: n/c - 2h after lunch: n/c - before dinner: n/c - 2h after dinner: n/c - bedtime: n/c - nighttime: n/c Lowest sugar was ***; she has hypoglycemia awareness at 70.  Highest sugar was ***.  Glucometer:***  Pt's meals are: - Breakfast: - Lunch: - Dinner: - Snacks:  - + ESRD - on HD, last BUN/creatinine:  Lab Results  Component Value Date   BUN 46 (H) 07/26/2022   BUN 75 (H) 07/25/2022   CREATININE 7.19 (H) 07/26/2022   CREATININE 10.47 (H) 07/25/2022   - + HL; last set of lipids: Lab Results  Component Value Date   CHOL 177 09/18/2021   HDL 37 (L) 09/18/2021   LDLCALC 70 09/18/2021   TRIG 352 (H) 09/18/2021   CHOLHDL 4.8 09/18/2021  On Lipitor 80 mg daily  - last eye exam was in 2019. + DR.   -  + numbness and tingling in her feet.  10/2019.  On Lyrica.  She also has a history of HTN, asthma, gout, GERD, H/oh hep C.  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  ROS: + see HPI No increased urination, blurry vision, nausea, chest pain.  Past Medical History:  Diagnosis Date   Anemia    Aortic stenosis    Echo 8/18: mean 13, peak 28, LVOT/AV mean velocity 0.51   Arthritis    Asthma    As a child    Bronchitis    CAD (coronary artery disease)    a. 09/2016: 50% Ost 1st Mrg stenosis, 50% 2nd Mrg stenosis, 20% Mid-Cx, 95% Prox LAD, 40% mid-LAD, and 10% dist-LAD stenosis. Staged PCI with DES to Prox-LAD.    Chronic combined systolic and diastolic CHF (congestive heart failure) ([place]) 2011   echo 2/18: EF 55-60, normal wall motion, grade 2 diastolic dysfunction, trivial AI // echo 3/18: Septal and apical HK, EF 45-50, normal wall motion, trivial AI, mild LAE, PASP 38 // echo 8/18: EF 60-65, normal wall motion, grade 1 diastolic dysfunction, calcified aortic valve leaflets, mild aortic stenosis (mean 13, peak 28, LVOT/AV mean velocity 0.51), mild AI, moderate MAC, mild LAE, trivial TR    Chronic kidney disease    STAGE 4   Chronic kidney disease  on chronic dialysis ([place])    t, th, sat   Complication of anesthesia    Depression    Diabetes mellitus Dx 1989   Elevated lipids    GERD (gastroesophageal reflux disease)    Gout    Heart murmur    asymptomatic   Hepatitis C Dx 2013   Hypertension Dx 1989   Infected surgical wound    Lt arm   Myocardial infarction ([place]) 07/2015   Obesity    Pancreatitis 2013   Pneumonia    Refusal of blood transfusions as patient is Jehovah's Witness    pt states she is not [place] witness and does not refuse blood products   Tendinitis    Tremors of nervous system    LEFT HAND   Ulcer 2010   Past Surgical History:  Procedure Laterality Date   A/V FISTULAGRAM Left 04/11/2019   Procedure: A/V FISTULAGRAM;  Surgeon: Katha Cabal, MD;   Location: [facility];  Service: Cardiovascular;  Laterality: Left;   A/V FISTULAGRAM Left 06/02/2019   Procedure: A/V FISTULAGRAM;  Surgeon: Katha Cabal, MD;  Location: [facility];  Service: Cardiovascular;  Laterality: Left;   APPLICATION OF WOUND VAC Left 08/14/2017   Procedure: APPLICATION OF WOUND VAC Exchange;  Surgeon: Robert Bellow, MD;  Location: [facility];  Service: General;  Laterality: Left;   APPLICATION OF WOUND VAC Left 12/21/2018   Procedure: APPLICATION OF WOUND VAC;  Surgeon: Katha Cabal, MD;  Location: [facility];  Service: Vascular;  Laterality: Left;   AV FISTULA PLACEMENT Left 08/19/2018   Procedure: ARTERIOVENOUS (AV) FISTULA CREATION ( BRACHIOBASILIC );  Surgeon: Katha Cabal, MD;  Location: [facility];  Service: Vascular;  Laterality: Left;   BASCILIC VEIN TRANSPOSITION Left 11/18/2018   Procedure: BASCILIC VEIN TRANSPOSITION;  Surgeon: Katha Cabal, MD;  Location: [facility];  Service: Vascular;  Laterality: Left;   BIOPSY  09/20/2020   Procedure: BIOPSY;  Surgeon: Carol Ada, MD;  Location: [place];  Service: Endoscopy;;   CHOLECYSTECTOMY     COLONOSCOPY WITH PROPOFOL N/A 02/03/2018   Procedure: COLONOSCOPY WITH PROPOFOL;  Surgeon: Lin Landsman, MD;  Location[facility];  Service: Gastroenterology;  Laterality: N/A;   COLONOSCOPY WITH PROPOFOL N/A 07/25/2022   Procedure: COLONOSCOPY WITH PROPOFOL;  Surgeon: Lesly Rubenstein, MD;  Location: [facility];  Service: Endoscopy;  Laterality: N/A;   CORONARY ANGIOPLASTY  07/2015   STENT   CORONARY STENT INTERVENTION N/A 09/18/2016   Procedure: Coronary Stent Intervention;  Surgeon: Troy Sine, MD;  Location: [place] [facility];  Service: Cardiovascular;  Laterality: N/A;   DIALYSIS/PERMA CATHETER INSERTION N/A 05/10/2018   Procedure: DIALYSIS/PERMA CATHETER INSERTION;  Surgeon: Katha Cabal, MD;  Location: [place] [facility];  Service: Cardiovascular;   Laterality: N/A;   DRESSING CHANGE UNDER ANESTHESIA Left 08/15/2017   Procedure: exploration of wound for bleeding;  Surgeon: Robert Bellow, MD;  Location: [facility];  Service: General;  Laterality: Left;   ESOPHAGOGASTRODUODENOSCOPY (EGD) WITH PROPOFOL N/A 02/03/2018   Procedure: ESOPHAGOGASTRODUODENOSCOPY (EGD) WITH PROPOFOL;  Surgeon: Lin Landsman, MD;  Location: [facility];  Service: Gastroenterology;  Laterality: N/A;   ESOPHAGOGASTRODUODENOSCOPY (EGD) WITH PROPOFOL N/A 09/20/2020   Procedure: ESOPHAGOGASTRODUODENOSCOPY (EGD) WITH PROPOFOL;  Surgeon: Carol Ada, MD;  Location: [place];  Service: Endoscopy;  Laterality: N/A;   ESOPHAGOGASTRODUODENOSCOPY (EGD) WITH PROPOFOL N/A 07/25/2022   Procedure: ESOPHAGOGASTRODUODENOSCOPY (EGD) WITH PROPOFOL;  Surgeon: Lesly Rubenstein, MD;  Location:  [place];  Service: Endoscopy;  Laterality: N/A;   EYE SURGERY  11/17/2018   INCISION AND DRAINAGE ABSCESS Left 08/12/2017   Procedure: INCISION AND DRAINAGE ABSCESS;  Surgeon: Robert Bellow, MD;  Location: [facility];  Service: General;  Laterality: Left;   KNEE ARTHROSCOPY     LEFT HEART CATH N/A 09/18/2016   Procedure: Left Heart Cath;  Surgeon: Troy Sine, MD;  Location: [place] [facility];  Service: Cardiovascular;  Laterality: N/A;   LEFT HEART CATH AND CORONARY ANGIOGRAPHY N/A 09/16/2016   Procedure: Left Heart Cath and Coronary Angiography;  Surgeon: Burnell Blanks, MD;  Location: [place] [facility];  Service: Cardiovascular;  Laterality: N/A;   LEFT HEART CATH AND CORONARY ANGIOGRAPHY N/A 04/29/2017   Procedure: LEFT HEART CATH AND CORONARY ANGIOGRAPHY;  Surgeon: Nelva Bush, MD;  Location: [place] [facility];  Service: Cardiovascular;  Laterality: N/A;   LOWER EXTREMITY ANGIOGRAPHY Right 03/08/2018   Procedure: LOWER EXTREMITY ANGIOGRAPHY;  Surgeon: Katha Cabal, MD;  Location: [place] [facility];  Service: Cardiovascular;  Laterality: Right;    TUBAL LIGATION     TUBAL LIGATION     UPPER EXTREMITY ANGIOGRAPHY Right 09/19/2019   Procedure: UPPER EXTREMITY ANGIOGRAPHY;  Surgeon: Katha Cabal, MD;  Location: [place] [facility];  Service: Cardiovascular;  Laterality: Right;   WOUND DEBRIDEMENT Left 12/21/2018   Procedure: DEBRIDEMENT WOUND;  Surgeon: Katha Cabal, MD;  Location: [facility];  Service: Vascular;  Laterality: Left;   WOUND DEBRIDEMENT Left 12/30/2018   Procedure: DEBRIDEMENT WOUND WITH VAC PLACEMENT (LEFT UPPER EXTREMITY);  Surgeon: Katha Cabal, MD;  Location: [facility];  Service: Vascular;  Laterality: Left;   Social History   Socioeconomic History   Marital status: Divorced    Spouse name: Not on file   Number of children: 3   Years of education: Not on file   Highest education level: Bachelor's degree (e.g., BA, AB, BS)  Occupational History   Occupation: disability  Tobacco Use   Smoking status: Former    Packs/day: 0.25    Years: 6.00    Total pack years: 1.50    Types: Cigarettes    Quit date: 10/25/1980    Years since quitting: 41.8   Smokeless tobacco: Never  Vaping Use   Vaping Use: Never used  Substance and Sexual Activity   Alcohol use: Not Currently    Comment: rare   Drug use: Not Currently    Types: Marijuana   Sexual activity: Not on file    Comment: Not asked  Other Topics Concern   Not on file  Social History Narrative   Not on file   Social Determinants of Health   Financial Resource Strain: Low Risk  (12/26/2019)   Overall Financial Resource Strain (CARDIA)    Difficulty of Paying Living Expenses: Not hard at all  Food Insecurity: Food Insecurity Present (07/23/2022)   Hunger Vital Sign    Worried About [place] in the Last Year: Sometimes true    Ran Out of Food in the Last Year: Sometimes true  Transportation Needs: No Transportation Needs (07/23/2022)   PRAPARE - Hydrologist (Medical): No    Lack of Transportation  (Non-Medical): No  Physical Activity: Inactive (12/26/2019)   Exercise Vital Sign    Days of Exercise per Week: 0 days    Minutes of Exercise per Session: 0 min  Stress: Stress Concern Present (12/26/2019)   [place]  [employer] - Occupational Stress Questionnaire    Feeling of Stress : To some extent  Social Connections: Socially Isolated (12/26/2019)   Social Connection and Isolation Panel [NHANES]    Frequency of Communication with Friends and Family: Twice a week    Frequency of Social Gatherings with Friends and Family: Never    Attends Religious Services: 1 to 4 times per year    Active Member of [employer] or Organizations: No    Attends Archivist Meetings: Never    Marital Status: Divorced  Human resources officer Violence: Not At Risk (07/23/2022)   Humiliation, Afraid, Rape, and Kick questionnaire    Fear of Current or Ex-Partner: No    Emotionally Abused: No    Physically Abused: No    Sexually Abused: No   Current Outpatient Medications on File Prior to Visit  Medication Sig Dispense Refill   albuterol (VENTOLIN HFA) 108 (90 Base) MCG/ACT inhaler Inhale 2 puffs into the lungs every 4 (four) hours as needed for wheezing or shortness of breath. 18 g 5   allopurinol (ZYLOPRIM) 100 MG tablet TAKE 1 TABLET BY MOUTH ONCE DAILY. 30 tablet 5   aspirin 81 MG EC tablet Take 1 tablet (81 mg total) by mouth daily. 30 tablet 12   atorvastatin (LIPITOR) 80 MG tablet Take 1 tablet (80 mg total) by mouth every evening. 90 tablet 0   carvedilol (COREG) 12.5 MG tablet Take 1 tablet (12.5 mg total) by mouth 2 (two) times daily with a meal. 60 tablet 0   Continuous Blood Gluc Receiver (FREESTYLE LIBRE 14 DAY READER) DEVI To check blood sugar ACHS 1 each 0   Continuous Blood Gluc Sensor (FREESTYLE LIBRE 14 DAY SENSOR) MISC To check blood sugar ACHS; change every 14 days 6 each 11   fluticasone (FLONASE) 50 MCG/ACT nasal spray Place 2 sprays into both nostrils daily as needed for  allergies or rhinitis. 48 g 1   fluticasone furoate-vilanterol (BREO ELLIPTA) 200-25 MCG/INH AEPB Inhale 1 puff into the lungs daily. (Patient not taking: Reported on 04/15/2022) 60 each 0   Glucose Blood (BLOOD GLUCOSE TEST STRIPS 333) STRP Enough test stripes to test blood sugar TID x 30 days. No refills 100 strip 0   HUMULIN 70/30 KWIKPEN (70-30) 100 UNIT/ML KwikPen Inject 30 Units into the skin 2 (two) times daily.     hydrOXYzine (ATARAX/VISTARIL) 25 MG tablet Take 1 tablet (25 mg total) by mouth 4 (four) times daily. (Patient taking differently: Take 25 mg by mouth 3 (three) times daily as needed.) 360 tablet 1   Insulin NPH, Human,, Isophane, (NOVOLIN N FLEXPEN) 100 UNIT/ML Kiwkpen Inject 100 Units into the skin every morning. 30 mL 0   Insulin Pen Needle 32G X 4 MM MISC Enough pen needles for 100 units of novolin QAM x 30 days. No refills 30 each 0   LEVEMIR FLEXPEN 100 UNIT/ML FlexPen Inject 60 Units into the skin daily. (Patient not taking: Reported on 07/23/2022)     methocarbamol (ROBAXIN) 500 MG tablet Take 1 tablet (500 mg total) by mouth 2 (two) times daily. (Patient not taking: Reported on 07/23/2022) 20 tablet 0   midodrine (PROAMATINE) 10 MG tablet Take 10 mg by mouth daily.     naloxone (NARCAN) nasal spray 4 mg/0.1 mL Place 1 spray into the nose as directed.     nitroGLYCERIN (NITROSTAT) 0.4 MG SL tablet Place 1 tablet (0.4 mg total) under the tongue every 5 (five) minutes x 3 doses as  needed for chest pain. 25 tablet 12   Oxycodone HCl 10 MG TABS Take 10 mg by mouth every 6 (six) hours.     pregabalin (LYRICA) 50 MG capsule Take 50 mg by mouth 2 (two) times daily.     valACYclovir (VALTREX) 500 MG tablet TAKE (1) TABLET BY MOUTH EVERY OTHER DAY. (Patient taking differently: Take 500 mg by mouth every other day.) 45 tablet 1   VELPHORO 500 MG chewable tablet Chew 1,500 mg by mouth 3 (three) times daily.     [DISCONTINUED] gabapentin (NEURONTIN) 100 MG capsule Take 1 capsule (100 mg  total) by mouth 3 (three) times daily. 90 capsule 1   No current facility-administered medications on file prior to visit.   Allergies  Allergen Reactions   Morphine Itching    Tolerated hydromorphone on 07/2020 *will take along with Benadryl*   Shellfish Allergy Anaphylaxis and Swelling   Diazepam Other (See Comments)    "felt like out of body experience"   Family History  Problem Relation Age of Onset   Colon cancer Mother    Heart attack Other    Heart attack Maternal Grandmother    Hypertension Sister    Hypertension Brother    Diabetes Paternal Grandmother    Breast cancer Neg Hx     PE: There were no vitals taken for this visit. Wt Readings from Last 3 Encounters:  07/25/22 276 lb 0.3 oz (125.2 kg)  05/26/22 252 lb (114.3 kg)  04/16/22 254 lb 10.1 oz (115.5 kg)   Constitutional: overweight, in NAD Eyes:  EOMI, no exophthalmos ENT: no neck masses, no cervical lymphadenopathy Cardiovascular: RRR, No MRG Respiratory: CTA B Musculoskeletal: no deformities Skin:no rashes Neurological: no tremor with outstretched hands  ASSESSMENT: 1. DM2, insulin-dependent, uncontrolled, with many complications: - ESRD - on HD - CAD - CHF - aortic stenosis - PAD - PN - h/o cellulitis  - h/o necrotizing fasciitis  2. HL  PLAN:  1. Patient with long-standing, uncontrolled diabetes, on daily intermediate acting insulin, with poor control.  At today's visit, HbA1c is **.  - I suggested to:  There are no Patient Instructions on file for this visit. - check sugars at different times of the day - check 1x a day, rotating checks - discussed about CBG targets for treatment: 80-130 mg/dL before meals and <180 mg/dL after meals; target HbA1c <7%. - given sugar log and advised how to fill it and to bring it at next appt  - given foot care handout  - given instructions for hypoglycemia management "15-15 rule"  - advised for yearly eye exams  - Return to clinic in 3-4 months  2.  HL - Reviewed latest lipid panel from 09/2021:none of the fractions are at goal: Lab Results  Component Value Date   CHOL 177 09/18/2021   HDL 37 (L) 09/18/2021   LDLCALC 70 09/18/2021   TRIG 352 (H) 09/18/2021   CHOLHDL 4.8 09/18/2021  - Continues Lipitor 80 mg daily without side effects.  Philemon Kingdom, MD PhD [facility] Endocrinology

## 2022-09-02 IMAGING — CR DG FEMUR 2+V*R*
4 series · 4 of 4 positions shown · non-contrast
Comparison: None.

CLINICAL DATA: Right leg pain

EXAM:
RIGHT FEMUR 2 VIEWS

[t femur proximal ap right]
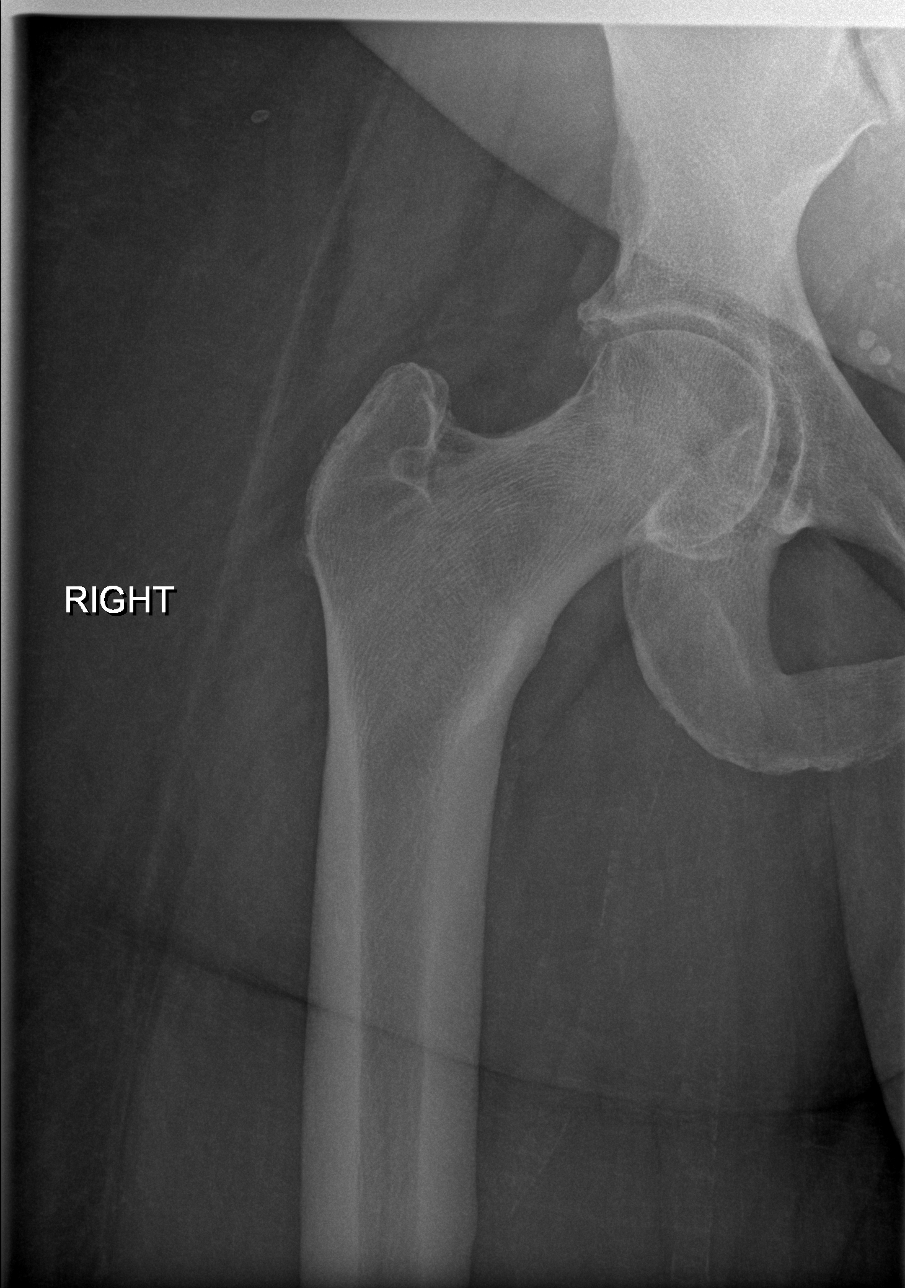

[t femur distal ap right]
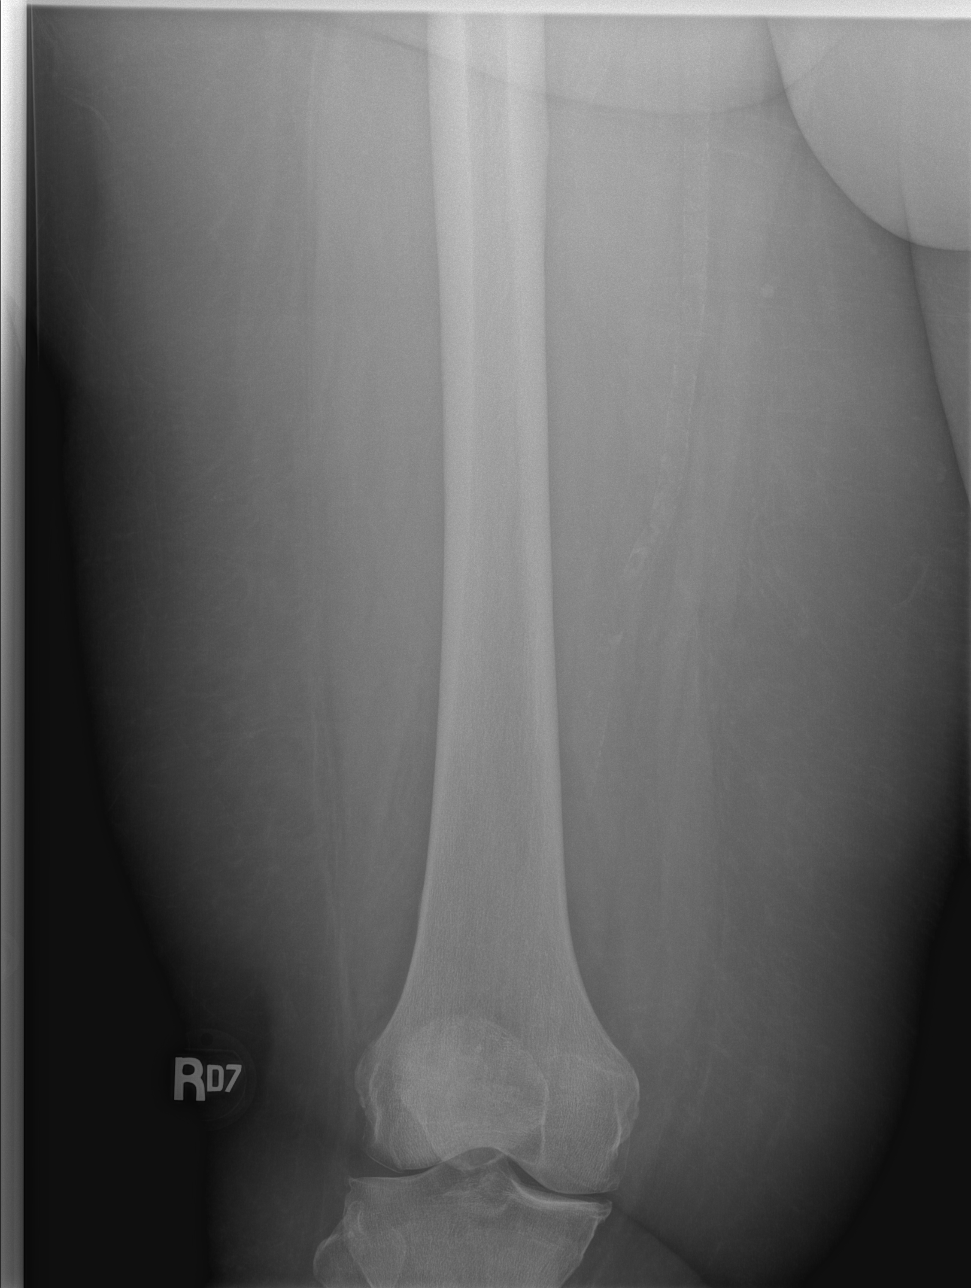

[t femur proximal lat right]
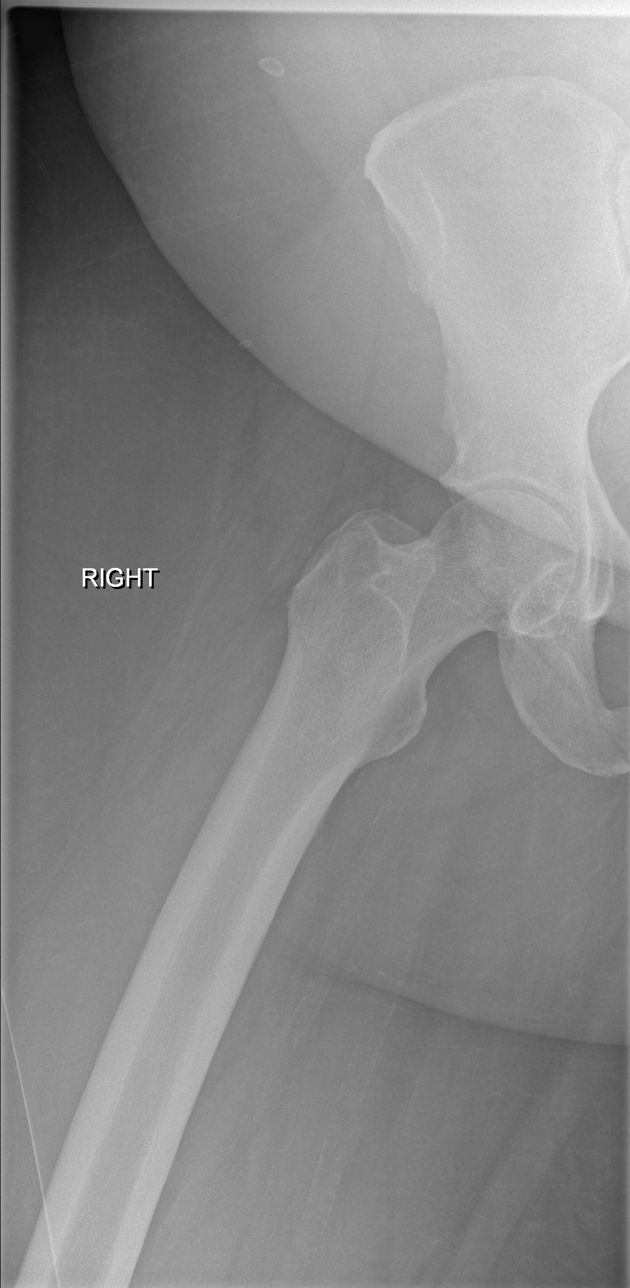

[x femur distal lat right]
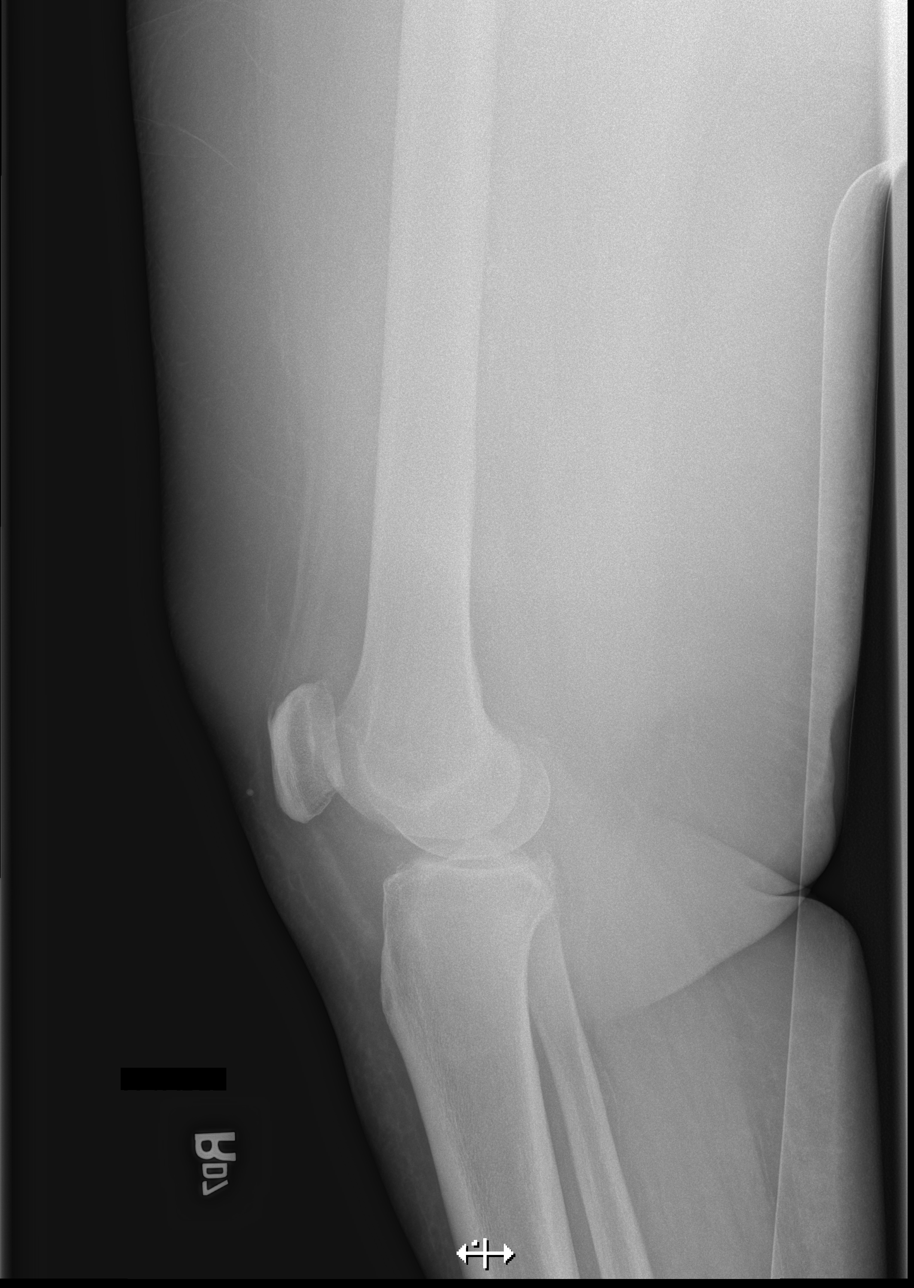

[4 of 4 positions shown; findings below may reference images not displayed]

FINDINGS: No acute bony abnormality. Specifically, no fracture, subluxation,
or dislocation. Joint spaces maintained. Vascular calcifications.
IMPRESSION: No acute bony abnormality.

## 2022-09-10 ENCOUNTER — Encounter: Payer: Self-pay | Admitting: Radiology

## 2022-09-23 ENCOUNTER — Ambulatory Visit: Payer: 59 | Admitting: Internal Medicine

## 2022-09-23 NOTE — Progress Notes (Deleted)
Patient ID: [name], female   DOB: Dec 29, 1955, 67 y.o.   MRN: [id number]  HPI: [name] is a 67 y.o.-year-old female, returning for follow-up for DM2, dx in 1990, insulin-dependent since 2002, uncontrolled, with complications (CAD, PAD, CHF, aortic stenosis, ESRD-on HD, PN). Pt. previously saw Dr. Loanne Drilling, last visit 11 months ago.  Reviewed HbA1c: 09/07/2022: HbA1c 8.6% Lab Results  Component Value Date   HGBA1C 8.8 (H) 07/23/2022   HGBA1C 8.1 (A) 10/28/2021   HGBA1C 10.7 (A) 07/23/2021   HGBA1C 12.8 (H) 04/21/2021   HGBA1C 12.9 (A) 03/03/2021   HGBA1C 6.8 (H) 09/27/2020   HGBA1C 8.4 (H) 09/13/2020   HGBA1C 10.0 (H) 01/30/2020   HGBA1C 8.7 (H) 01/16/2020   HGBA1C 9.6 (H) 12/28/2018   Pt is on a regimen of: - NPH 120 units in a.m. Per review of Dr. Cordelia Pen note, she was previously noncompliant with multiple daily insulin injections.  Pt checks her sugars *** a day and they are: - am: n/c - 2h after b'fast: n/c - before lunch: n/c - 2h after lunch: n/c - before dinner: n/c - 2h after dinner: n/c - bedtime: n/c - nighttime: n/c Lowest sugar was ***; she has hypoglycemia awareness at 70.  Highest sugar was 300s.  Glucometer:***  Pt's meals are: - Breakfast: - Lunch: - Dinner: - Snacks:  -She has ESRD, on hemodialysis Lab Results  Component Value Date   BUN 46 (H) 07/26/2022   BUN 75 (H) 07/25/2022   CREATININE 7.19 (H) 07/26/2022   CREATININE 10.47 (H) 07/25/2022   - last set of lipids: Lab Results  Component Value Date   CHOL 177 09/18/2021   HDL 37 (L) 09/18/2021   LDLCALC 70 09/18/2021   TRIG 352 (H) 09/18/2021   CHOLHDL 4.8 09/18/2021  She is on Lipitor 80 mg daily.  - last eye exam was in 2019. No DR.   - + numbness and tingling in her feet.  On Lyrica 150 mg twice a day.  She also has a history of HTN, hep C, obesity. She has a history of pancreatitis in 2002.  ROS: + see HPI No increased urination, blurry vision, nausea, chest  pain.  Past Medical History:  Diagnosis Date   Anemia    Aortic stenosis    Echo 8/18: mean 13, peak 28, LVOT/AV mean velocity 0.51   Arthritis    Asthma    As a child    Bronchitis    CAD (coronary artery disease)    a. 09/2016: 50% Ost 1st Mrg stenosis, 50% 2nd Mrg stenosis, 20% Mid-Cx, 95% Prox LAD, 40% mid-LAD, and 10% dist-LAD stenosis. Staged PCI with DES to Prox-LAD.    Chronic combined systolic and diastolic CHF (congestive heart failure) ([place]) 2011   echo 2/18: EF 55-60, normal wall motion, grade 2 diastolic dysfunction, trivial AI // echo 3/18: Septal and apical HK, EF 45-50, normal wall motion, trivial AI, mild LAE, PASP 38 // echo 8/18: EF 60-65, normal wall motion, grade 1 diastolic dysfunction, calcified aortic valve leaflets, mild aortic stenosis (mean 13, peak 28, LVOT/AV mean velocity 0.51), mild AI, moderate MAC, mild LAE, trivial TR    Chronic kidney disease    STAGE 4   Chronic kidney disease on chronic dialysis (HCC)    t, th, sat   Complication of anesthesia    Depression    Diabetes mellitus Dx 1989   Elevated lipids    GERD (gastroesophageal reflux disease)    Gout  Heart murmur    asymptomatic   Hepatitis C Dx 2013   Hypertension Dx 1989   Infected surgical wound    Lt arm   Myocardial infarction ([place]) 07/2015   Obesity    Pancreatitis 2013   Pneumonia    Refusal of blood transfusions as patient is Jehovah's Witness    pt states she is not [place] witness and does not refuse blood products   Tendinitis    Tremors of nervous system    LEFT HAND   Ulcer 2010   Past Surgical History:  Procedure Laterality Date   A/V FISTULAGRAM Left 04/11/2019   Procedure: A/V FISTULAGRAM;  Surgeon: Katha Cabal, MD;  Location: [place] [facility];  Service: Cardiovascular;  Laterality: Left;   A/V FISTULAGRAM Left 06/02/2019   Procedure: A/V FISTULAGRAM;  Surgeon: Katha Cabal, MD;  Location: [place] [facility];  Service: Cardiovascular;   Laterality: Left;   APPLICATION OF WOUND VAC Left 08/14/2017   Procedure: APPLICATION OF WOUND VAC Exchange;  Surgeon: Robert Bellow, MD;  Location: [facility];  Service: General;  Laterality: Left;   APPLICATION OF WOUND VAC Left 12/21/2018   Procedure: APPLICATION OF WOUND VAC;  Surgeon: Katha Cabal, MD;  Location: [facility];  Service: Vascular;  Laterality: Left;   AV FISTULA PLACEMENT Left 08/19/2018   Procedure: ARTERIOVENOUS (AV) FISTULA CREATION ( BRACHIOBASILIC );  Surgeon: Katha Cabal, MD;  Location: [facility];  Service: Vascular;  Laterality: Left;   BASCILIC VEIN TRANSPOSITION Left 11/18/2018   Procedure: BASCILIC VEIN TRANSPOSITION;  Surgeon: Katha Cabal, MD;  Location: [facility];  Service: Vascular;  Laterality: Left;   BIOPSY  09/20/2020   Procedure: BIOPSY;  Surgeon: Carol Ada, MD;  Location: [place];  Service: Endoscopy;;   CHOLECYSTECTOMY     COLONOSCOPY WITH PROPOFOL N/A 02/03/2018   Procedure: COLONOSCOPY WITH PROPOFOL;  Surgeon: Lin Landsman, MD;  Location: [facility];  Service: Gastroenterology;  Laterality: N/A;   COLONOSCOPY WITH PROPOFOL N/A 07/25/2022   Procedure: COLONOSCOPY WITH PROPOFOL;  Surgeon: Lesly Rubenstein, MD;  Location: [facility];  Service: Endoscopy;  Laterality: N/A;   CORONARY ANGIOPLASTY  07/2015   STENT   CORONARY STENT INTERVENTION N/A 09/18/2016   Procedure: Coronary Stent Intervention;  Surgeon: Troy Sine, MD;  Location: [place] [facility];  Service: Cardiovascular;  Laterality: N/A;   DIALYSIS/PERMA CATHETER INSERTION N/A 05/10/2018   Procedure: DIALYSIS/PERMA CATHETER INSERTION;  Surgeon: Katha Cabal, MD;  Location: [place] [facility];  Service: Cardiovascular;  Laterality: N/A;   DRESSING CHANGE UNDER ANESTHESIA Left 08/15/2017   Procedure: exploration of wound for bleeding;  Surgeon: Robert Bellow, MD;  Location: [facility];  Service: General;  Laterality: Left;   ESOPHAGOGASTRODUODENOSCOPY  (EGD) WITH PROPOFOL N/A 02/03/2018   Procedure: ESOPHAGOGASTRODUODENOSCOPY (EGD) WITH PROPOFOL;  Surgeon: Lin Landsman, MD;  Location: [facility];  Service: Gastroenterology;  Laterality: N/A;   ESOPHAGOGASTRODUODENOSCOPY (EGD) WITH PROPOFOL N/A 09/20/2020   Procedure: ESOPHAGOGASTRODUODENOSCOPY (EGD) WITH PROPOFOL;  Surgeon: Carol Ada, MD;  Location: [place];  Service: Endoscopy;  Laterality: N/A;   ESOPHAGOGASTRODUODENOSCOPY (EGD) WITH PROPOFOL N/A 07/25/2022   Procedure: ESOPHAGOGASTRODUODENOSCOPY (EGD) WITH PROPOFOL;  Surgeon: Lesly Rubenstein, MD;  Location: [facility];  Service: Endoscopy;  Laterality: N/A;   EYE SURGERY  11/17/2018   INCISION AND DRAINAGE ABSCESS Left 08/12/2017   Procedure: INCISION AND DRAINAGE ABSCESS;  Surgeon: Robert Bellow, MD;  Location: [facility];  Service: General;  Laterality:  Left;   KNEE ARTHROSCOPY     LEFT HEART CATH N/A 09/18/2016   Procedure: Left Heart Cath;  Surgeon: Troy Sine, MD;  Location: [place] [facility];  Service: Cardiovascular;  Laterality: N/A;   LEFT HEART CATH AND CORONARY ANGIOGRAPHY N/A 09/16/2016   Procedure: Left Heart Cath and Coronary Angiography;  Surgeon: Burnell Blanks, MD;  Location: [place] [facility];  Service: Cardiovascular;  Laterality: N/A;   LEFT HEART CATH AND CORONARY ANGIOGRAPHY N/A 04/29/2017   Procedure: LEFT HEART CATH AND CORONARY ANGIOGRAPHY;  Surgeon: Nelva Bush, MD;  Location: [place] [facility];  Service: Cardiovascular;  Laterality: N/A;   LOWER EXTREMITY ANGIOGRAPHY Right 03/08/2018   Procedure: LOWER EXTREMITY ANGIOGRAPHY;  Surgeon: Katha Cabal, MD;  Location: [place] [facility];  Service: Cardiovascular;  Laterality: Right;   TUBAL LIGATION     TUBAL LIGATION     UPPER EXTREMITY ANGIOGRAPHY Right 09/19/2019   Procedure: UPPER EXTREMITY ANGIOGRAPHY;  Surgeon: Katha Cabal, MD;  Location: [place] [facility];  Service: Cardiovascular;  Laterality: Right;    WOUND DEBRIDEMENT Left 12/21/2018   Procedure: DEBRIDEMENT WOUND;  Surgeon: Katha Cabal, MD;  Location: [facility];  Service: Vascular;  Laterality: Left;   WOUND DEBRIDEMENT Left 12/30/2018   Procedure: DEBRIDEMENT WOUND WITH VAC PLACEMENT (LEFT UPPER EXTREMITY);  Surgeon: Katha Cabal, MD;  Location: [facility];  Service: Vascular;  Laterality: Left;   Social History   Socioeconomic History   Marital status: Divorced    Spouse name: Not on file   Number of children: 3   Years of education: Not on file   Highest education level: Bachelor's degree (e.g., BA, AB, BS)  Occupational History   Occupation: disability  Tobacco Use   Smoking status: Former    Packs/day: 0.25    Years: 6.00    Total pack years: 1.50    Types: Cigarettes    Quit date: 10/25/1980    Years since quitting: 41.9   Smokeless tobacco: Never  Vaping Use   Vaping Use: Never used  Substance and Sexual Activity   Alcohol use: Not Currently    Comment: rare   Drug use: Not Currently    Types: Marijuana   Sexual activity: Not on file    Comment: Not asked  Other Topics Concern   Not on file  Social History Narrative   Not on file   Social Determinants of Health   Financial Resource Strain: Low Risk  (12/26/2019)   Overall Financial Resource Strain (CARDIA)    Difficulty of Paying Living Expenses: Not hard at all  Food Insecurity: Food Insecurity Present (07/23/2022)   Hunger Vital Sign    Worried About [place] in the Last Year: Sometimes true    Ran Out of Food in the Last Year: Sometimes true  Transportation Needs: No Transportation Needs (07/23/2022)   PRAPARE - Hydrologist (Medical): No    Lack of Transportation (Non-Medical): No  Physical Activity: Inactive (12/26/2019)   Exercise Vital Sign    Days of Exercise per Week: 0 days    Minutes of Exercise per Session: 0 min  Stress: Stress Concern Present (12/26/2019)   [place]    Feeling of Stress : To some extent  Social Connections: Socially Isolated (12/26/2019)   Social Connection and Isolation Panel [NHANES]    Frequency of Communication with Friends and Family: Twice a  week    Frequency of Social Gatherings with Friends and Family: Never    Attends Religious Services: 1 to 4 times per year    Active Member of [employer] or Organizations: No    Attends Archivist Meetings: Never    Marital Status: Divorced  Human resources officer Violence: Not At Risk (07/23/2022)   Humiliation, Afraid, Rape, and Kick questionnaire    Fear of Current or Ex-Partner: No    Emotionally Abused: No    Physically Abused: No    Sexually Abused: No   Current Outpatient Medications on File Prior to Visit  Medication Sig Dispense Refill   albuterol (VENTOLIN HFA) 108 (90 Base) MCG/ACT inhaler Inhale 2 puffs into the lungs every 4 (four) hours as needed for wheezing or shortness of breath. 18 g 5   allopurinol (ZYLOPRIM) 100 MG tablet TAKE 1 TABLET BY MOUTH ONCE DAILY. 30 tablet 5   aspirin 81 MG EC tablet Take 1 tablet (81 mg total) by mouth daily. 30 tablet 12   atorvastatin (LIPITOR) 80 MG tablet Take 1 tablet (80 mg total) by mouth every evening. 90 tablet 0   carvedilol (COREG) 12.5 MG tablet Take 1 tablet (12.5 mg total) by mouth 2 (two) times daily with a meal. 60 tablet 0   Continuous Blood Gluc Receiver (FREESTYLE LIBRE 14 DAY READER) DEVI To check blood sugar ACHS 1 each 0   Continuous Blood Gluc Sensor (FREESTYLE LIBRE 14 DAY SENSOR) MISC To check blood sugar ACHS; change every 14 days 6 each 11   fluticasone (FLONASE) 50 MCG/ACT nasal spray Place 2 sprays into both nostrils daily as needed for allergies or rhinitis. 48 g 1   fluticasone furoate-vilanterol (BREO ELLIPTA) 200-25 MCG/INH AEPB Inhale 1 puff into the lungs daily. (Patient not taking: Reported on 04/15/2022) 60 each 0   Glucose Blood (BLOOD GLUCOSE TEST STRIPS 333) STRP  Enough test stripes to test blood sugar TID x 30 days. No refills 100 strip 0   HUMULIN 70/30 KWIKPEN (70-30) 100 UNIT/ML KwikPen Inject 30 Units into the skin 2 (two) times daily.     hydrOXYzine (ATARAX/VISTARIL) 25 MG tablet Take 1 tablet (25 mg total) by mouth 4 (four) times daily. (Patient taking differently: Take 25 mg by mouth 3 (three) times daily as needed.) 360 tablet 1   Insulin NPH, Human,, Isophane, (NOVOLIN N FLEXPEN) 100 UNIT/ML Kiwkpen Inject 100 Units into the skin every morning. 30 mL 0   Insulin Pen Needle 32G X 4 MM MISC Enough pen needles for 100 units of novolin QAM x 30 days. No refills 30 each 0   LEVEMIR FLEXPEN 100 UNIT/ML FlexPen Inject 60 Units into the skin daily. (Patient not taking: Reported on 07/23/2022)     methocarbamol (ROBAXIN) 500 MG tablet Take 1 tablet (500 mg total) by mouth 2 (two) times daily. (Patient not taking: Reported on 07/23/2022) 20 tablet 0   midodrine (PROAMATINE) 10 MG tablet Take 10 mg by mouth daily.     naloxone (NARCAN) nasal spray 4 mg/0.1 mL Place 1 spray into the nose as directed.     nitroGLYCERIN (NITROSTAT) 0.4 MG SL tablet Place 1 tablet (0.4 mg total) under the tongue every 5 (five) minutes x 3 doses as needed for chest pain. 25 tablet 12   Oxycodone HCl 10 MG TABS Take 10 mg by mouth every 6 (six) hours.     pregabalin (LYRICA) 50 MG capsule Take 50 mg by mouth 2 (two) times daily.  valACYclovir (VALTREX) 500 MG tablet TAKE (1) TABLET BY MOUTH EVERY OTHER DAY. (Patient taking differently: Take 500 mg by mouth every other day.) 45 tablet 1   VELPHORO 500 MG chewable tablet Chew 1,500 mg by mouth 3 (three) times daily.     [DISCONTINUED] gabapentin (NEURONTIN) 100 MG capsule Take 1 capsule (100 mg total) by mouth 3 (three) times daily. 90 capsule 1   No current facility-administered medications on file prior to visit.   Allergies  Allergen Reactions   Morphine Itching    Tolerated hydromorphone on 07/2020 *will take along with  Benadryl*   Shellfish Allergy Anaphylaxis and Swelling   Diazepam Other (See Comments)    "felt like out of body experience"   Family History  Problem Relation Age of Onset   Colon cancer Mother    Heart attack Other    Heart attack Maternal Grandmother    Hypertension Sister    Hypertension Brother    Diabetes Paternal Grandmother    Breast cancer Neg Hx     PE: There were no vitals taken for this visit. Wt Readings from Last 3 Encounters:  07/25/22 276 lb 0.3 oz (125.2 kg)  05/26/22 252 lb (114.3 kg)  04/16/22 254 lb 10.1 oz (115.5 kg)   Constitutional: overweight, in NAD Eyes:  EOMI, no exophthalmos ENT: no neck masses, no cervical lymphadenopathy Cardiovascular: RRR, No MRG Respiratory: CTA B Musculoskeletal: no deformities Skin:no rashes Neurological: no tremor with outstretched hands  ASSESSMENT: 1. DM2, insulin-dependent, uncontrolled, with complications - CAD, s/p AMI - CHF - Ao stenosis - PAD - ESRD - on HD - PN - h/o perineal necrotizing fasciitis  She has a history of pancreatitis in 2002.  2. HL  PLAN:  1. Patient with long-standing, uncontrolled diabetes, on high-dose intermediate acting insulin only, with still poor control.  Latest HbA1c was 8.8% 2 months ago and 8.6% ~2 weeks ago.  - I suggested to:  There are no Patient Instructions on file for this visit. - check sugars at different times of the day - check 2x a day, rotating checks - discussed about CBG targets for treatment: 80-130 mg/dL before meals and <180 mg/dL after meals; target HbA1c <7%. - given sugar log and advised how to fill it and to bring it at next appt  - given foot care handout  - given instructions for hypoglycemia management "15-15 rule"  - advised for yearly eye exams  - Return to clinic in 3-4 months  2. HL - Reviewed latest lipid panel from 1 year ago: LDL higher than our goal of less than 55 due to her history of cardiovascular disease, HDL low, triglycerides  high Lab Results  Component Value Date   CHOL 177 09/18/2021   HDL 37 (L) 09/18/2021   LDLCALC 70 09/18/2021   TRIG 352 (H) 09/18/2021   CHOLHDL 4.8 09/18/2021  - Continues Lipitor 80 mg daily without side effects.  Philemon Kingdom, MD PhD [facility] Endocrinology

## 2022-10-20 ENCOUNTER — Inpatient Hospital Stay (HOSPITAL_COMMUNITY)
Admission: EM | Admit: 2022-10-20 | Discharge: 2022-11-01 | DRG: 616 | Disposition: A | Discharge status: Home Health | Payer: 59 | Attending: Internal Medicine | Admitting: Internal Medicine

## 2022-10-20 ENCOUNTER — Emergency Department (HOSPITAL_COMMUNITY): Payer: 59

## 2022-10-20 DIAGNOSIS — A0472 Enterocolitis due to Clostridium difficile, not specified as recurrent: Secondary | ICD-10-CM | POA: Diagnosis not present

## 2022-10-20 DIAGNOSIS — B1920 Unspecified viral hepatitis C without hepatic coma: Secondary | ICD-10-CM | POA: Diagnosis present

## 2022-10-20 DIAGNOSIS — Z888 Allergy status to other drugs, medicaments and biological substances status: Secondary | ICD-10-CM

## 2022-10-20 DIAGNOSIS — Z79899 Other long term (current) drug therapy: Secondary | ICD-10-CM

## 2022-10-20 DIAGNOSIS — Z89411 Acquired absence of right great toe: Secondary | ICD-10-CM

## 2022-10-20 DIAGNOSIS — I252 Old myocardial infarction: Secondary | ICD-10-CM

## 2022-10-20 DIAGNOSIS — M869 Osteomyelitis, unspecified: Secondary | ICD-10-CM | POA: Insufficient documentation

## 2022-10-20 DIAGNOSIS — M84478A Pathological fracture, left toe(s), initial encounter for fracture: Secondary | ICD-10-CM | POA: Diagnosis present

## 2022-10-20 DIAGNOSIS — N186 End stage renal disease: Secondary | ICD-10-CM | POA: Diagnosis present

## 2022-10-20 DIAGNOSIS — E785 Hyperlipidemia, unspecified: Secondary | ICD-10-CM | POA: Diagnosis present

## 2022-10-20 DIAGNOSIS — T8781 Dehiscence of amputation stump: Secondary | ICD-10-CM | POA: Diagnosis present

## 2022-10-20 DIAGNOSIS — Z87891 Personal history of nicotine dependence: Secondary | ICD-10-CM

## 2022-10-20 DIAGNOSIS — W19XXXA Unspecified fall, initial encounter: Secondary | ICD-10-CM | POA: Diagnosis present

## 2022-10-20 DIAGNOSIS — K5100 Ulcerative (chronic) pancolitis without complications: Secondary | ICD-10-CM | POA: Insufficient documentation

## 2022-10-20 DIAGNOSIS — E1151 Type 2 diabetes mellitus with diabetic peripheral angiopathy without gangrene: Secondary | ICD-10-CM | POA: Diagnosis present

## 2022-10-20 DIAGNOSIS — E11621 Type 2 diabetes mellitus with foot ulcer: Secondary | ICD-10-CM | POA: Diagnosis present

## 2022-10-20 DIAGNOSIS — I10 Essential (primary) hypertension: Secondary | ICD-10-CM | POA: Diagnosis present

## 2022-10-20 DIAGNOSIS — Z992 Dependence on renal dialysis: Secondary | ICD-10-CM

## 2022-10-20 DIAGNOSIS — G9340 Encephalopathy, unspecified: Secondary | ICD-10-CM

## 2022-10-20 DIAGNOSIS — M86172 Other acute osteomyelitis, left ankle and foot: Secondary | ICD-10-CM | POA: Diagnosis present

## 2022-10-20 DIAGNOSIS — M898X9 Other specified disorders of bone, unspecified site: Secondary | ICD-10-CM | POA: Diagnosis present

## 2022-10-20 DIAGNOSIS — K219 Gastro-esophageal reflux disease without esophagitis: Secondary | ICD-10-CM | POA: Diagnosis present

## 2022-10-20 DIAGNOSIS — E669 Obesity, unspecified: Secondary | ICD-10-CM | POA: Diagnosis present

## 2022-10-20 DIAGNOSIS — I70262 Atherosclerosis of native arteries of extremities with gangrene, left leg: Secondary | ICD-10-CM | POA: Diagnosis not present

## 2022-10-20 DIAGNOSIS — G9341 Metabolic encephalopathy: Secondary | ICD-10-CM | POA: Diagnosis present

## 2022-10-20 DIAGNOSIS — J4489 Other specified chronic obstructive pulmonary disease: Secondary | ICD-10-CM | POA: Diagnosis present

## 2022-10-20 DIAGNOSIS — Z9049 Acquired absence of other specified parts of digestive tract: Secondary | ICD-10-CM

## 2022-10-20 DIAGNOSIS — E1169 Type 2 diabetes mellitus with other specified complication: Principal | ICD-10-CM | POA: Diagnosis present

## 2022-10-20 DIAGNOSIS — D631 Anemia in chronic kidney disease: Secondary | ICD-10-CM | POA: Diagnosis present

## 2022-10-20 DIAGNOSIS — F32A Depression, unspecified: Secondary | ICD-10-CM | POA: Diagnosis present

## 2022-10-20 DIAGNOSIS — E11649 Type 2 diabetes mellitus with hypoglycemia without coma: Secondary | ICD-10-CM | POA: Diagnosis not present

## 2022-10-20 DIAGNOSIS — E1122 Type 2 diabetes mellitus with diabetic chronic kidney disease: Secondary | ICD-10-CM

## 2022-10-20 DIAGNOSIS — I132 Hypertensive heart and chronic kidney disease with heart failure and with stage 5 chronic kidney disease, or end stage renal disease: Secondary | ICD-10-CM | POA: Diagnosis present

## 2022-10-20 DIAGNOSIS — Z79891 Long term (current) use of opiate analgesic: Secondary | ICD-10-CM

## 2022-10-20 DIAGNOSIS — Z794 Long term (current) use of insulin: Secondary | ICD-10-CM

## 2022-10-20 DIAGNOSIS — R9431 Abnormal electrocardiogram [ECG] [EKG]: Secondary | ICD-10-CM | POA: Diagnosis not present

## 2022-10-20 DIAGNOSIS — M25551 Pain in right hip: Secondary | ICD-10-CM | POA: Diagnosis present

## 2022-10-20 DIAGNOSIS — E1140 Type 2 diabetes mellitus with diabetic neuropathy, unspecified: Secondary | ICD-10-CM | POA: Diagnosis present

## 2022-10-20 DIAGNOSIS — N2581 Secondary hyperparathyroidism of renal origin: Secondary | ICD-10-CM | POA: Diagnosis present

## 2022-10-20 DIAGNOSIS — Z885 Allergy status to narcotic agent status: Secondary | ICD-10-CM

## 2022-10-20 DIAGNOSIS — I5032 Chronic diastolic (congestive) heart failure: Secondary | ICD-10-CM | POA: Diagnosis present

## 2022-10-20 DIAGNOSIS — I350 Nonrheumatic aortic (valve) stenosis: Secondary | ICD-10-CM | POA: Diagnosis present

## 2022-10-20 DIAGNOSIS — Z6838 Body mass index (BMI) 38.0-38.9, adult: Secondary | ICD-10-CM

## 2022-10-20 DIAGNOSIS — G8929 Other chronic pain: Secondary | ICD-10-CM | POA: Diagnosis present

## 2022-10-20 DIAGNOSIS — Z8249 Family history of ischemic heart disease and other diseases of the circulatory system: Secondary | ICD-10-CM

## 2022-10-20 DIAGNOSIS — Z833 Family history of diabetes mellitus: Secondary | ICD-10-CM

## 2022-10-20 DIAGNOSIS — I2510 Atherosclerotic heart disease of native coronary artery without angina pectoris: Secondary | ICD-10-CM | POA: Diagnosis not present

## 2022-10-20 DIAGNOSIS — Z91013 Allergy to seafood: Secondary | ICD-10-CM

## 2022-10-20 DIAGNOSIS — Z955 Presence of coronary angioplasty implant and graft: Secondary | ICD-10-CM

## 2022-10-20 DIAGNOSIS — M1990 Unspecified osteoarthritis, unspecified site: Secondary | ICD-10-CM | POA: Diagnosis present

## 2022-10-20 DIAGNOSIS — M86272 Subacute osteomyelitis, left ankle and foot: Secondary | ICD-10-CM | POA: Diagnosis not present

## 2022-10-20 DIAGNOSIS — Z7951 Long term (current) use of inhaled steroids: Secondary | ICD-10-CM

## 2022-10-20 DIAGNOSIS — M109 Gout, unspecified: Secondary | ICD-10-CM | POA: Diagnosis present

## 2022-10-20 DIAGNOSIS — Z22322 Carrier or suspected carrier of Methicillin resistant Staphylococcus aureus: Secondary | ICD-10-CM

## 2022-10-20 DIAGNOSIS — Z7982 Long term (current) use of aspirin: Secondary | ICD-10-CM

## 2022-10-20 DIAGNOSIS — I739 Peripheral vascular disease, unspecified: Secondary | ICD-10-CM | POA: Diagnosis not present

## 2022-10-20 DIAGNOSIS — J449 Chronic obstructive pulmonary disease, unspecified: Secondary | ICD-10-CM | POA: Diagnosis present

## 2022-10-20 HISTORY — DX: Encephalopathy, unspecified: G93.40

## 2022-10-20 LAB — CBC
HCT: 41 % (ref 36.0–46.0)
Hemoglobin: 12.7 g/dL (ref 12.0–15.0)
MCH: 27.9 pg (ref 26.0–34.0)
MCHC: 31 g/dL (ref 30.0–36.0)
MCV: 90.1 fL (ref 80.0–100.0)
Platelets: 173 10*3/uL (ref 150–400)
RBC: 4.55 MIL/uL (ref 3.87–5.11)
RDW: 17.9 % — ABNORMAL HIGH (ref 11.5–15.5)
WBC: 9.2 10*3/uL (ref 4.0–10.5)
nRBC: 0 % (ref 0.0–0.2)

## 2022-10-20 LAB — RAPID URINE DRUG SCREEN, HOSP PERFORMED
Amphetamines: NOT DETECTED
Barbiturates: NOT DETECTED
Benzodiazepines: NOT DETECTED
Cocaine: NOT DETECTED
Opiates: NOT DETECTED
Tetrahydrocannabinol: NOT DETECTED

## 2022-10-20 LAB — I-STAT CHEM 8, ED
BUN: 61 mg/dL — ABNORMAL HIGH (ref 8–23)
Calcium, Ion: 0.91 mmol/L — ABNORMAL LOW (ref 1.15–1.40)
Chloride: 101 mmol/L (ref 98–111)
Creatinine, Ser: 13.3 mg/dL — ABNORMAL HIGH (ref 0.44–1.00)
Glucose, Bld: 205 mg/dL — ABNORMAL HIGH (ref 70–99)
HCT: 46 % (ref 36.0–46.0)
Hemoglobin: 15.6 g/dL — ABNORMAL HIGH (ref 12.0–15.0)
Potassium: 4.7 mmol/L (ref 3.5–5.1)
Sodium: 134 mmol/L — ABNORMAL LOW (ref 135–145)
TCO2: 24 mmol/L (ref 22–32)

## 2022-10-20 LAB — COMPREHENSIVE METABOLIC PANEL
ALT: 18 U/L (ref 0–44)
AST: 15 U/L (ref 15–41)
Albumin: 3.5 g/dL (ref 3.5–5.0)
Alkaline Phosphatase: 273 U/L — ABNORMAL HIGH (ref 38–126)
Anion gap: 18 — ABNORMAL HIGH (ref 5–15)
BUN: 65 mg/dL — ABNORMAL HIGH (ref 8–23)
CO2: 23 mmol/L (ref 22–32)
Calcium: 8.6 mg/dL — ABNORMAL LOW (ref 8.9–10.3)
Chloride: 93 mmol/L — ABNORMAL LOW (ref 98–111)
Creatinine, Ser: 12.18 mg/dL — ABNORMAL HIGH (ref 0.44–1.00)
GFR, Estimated: 3 mL/min — ABNORMAL LOW (ref >60)
Glucose, Bld: 219 mg/dL — ABNORMAL HIGH (ref 70–99)
Potassium: 4.7 mmol/L (ref 3.5–5.1)
Sodium: 134 mmol/L — ABNORMAL LOW (ref 135–145)
Total Bilirubin: 0.4 mg/dL (ref 0.3–1.2)
Total Protein: 7 g/dL (ref 6.5–8.1)

## 2022-10-20 LAB — URINALYSIS, ROUTINE W REFLEX MICROSCOPIC
Bacteria, UA: NONE SEEN
Bilirubin Urine: NEGATIVE
Glucose, UA: 150 mg/dL — AB
Ketones, ur: NEGATIVE mg/dL
Leukocytes,Ua: NEGATIVE
Nitrite: NEGATIVE
Protein, ur: 100 mg/dL — AB
Specific Gravity, Urine: 1.014 (ref 1.005–1.030)
pH: 6 (ref 5.0–8.0)

## 2022-10-20 LAB — CBG MONITORING, ED: Glucose-Capillary: 202 mg/dL — ABNORMAL HIGH (ref 70–99)

## 2022-10-20 LAB — I-STAT VENOUS BLOOD GAS, ED
Acid-Base Excess: 0 mmol/L (ref 0.0–2.0)
Bicarbonate: 23.8 mmol/L (ref 20.0–28.0)
Calcium, Ion: 0.94 mmol/L — ABNORMAL LOW (ref 1.15–1.40)
HCT: 45 % (ref 36.0–46.0)
Hemoglobin: 15.3 g/dL — ABNORMAL HIGH (ref 12.0–15.0)
O2 Saturation: 91 %
Potassium: 4.7 mmol/L (ref 3.5–5.1)
Sodium: 134 mmol/L — ABNORMAL LOW (ref 135–145)
TCO2: 25 mmol/L (ref 22–32)
pCO2, Ven: 37 mmHg — ABNORMAL LOW (ref 44–60)
pH, Ven: 7.417 (ref 7.25–7.43)
pO2, Ven: 61 mmHg — ABNORMAL HIGH (ref 32–45)

## 2022-10-20 LAB — LACTIC ACID, PLASMA: Lactic Acid, Venous: 1.5 mmol/L (ref 0.5–1.9)

## 2022-10-20 LAB — ACETAMINOPHEN LEVEL: Acetaminophen (Tylenol), Serum: <10 ug/mL — ABNORMAL LOW (ref 10–30)

## 2022-10-20 LAB — ETHANOL: Alcohol, Ethyl (B): <10 mg/dL (ref <10)

## 2022-10-20 LAB — AMMONIA: Ammonia: 17 umol/L (ref 9–35)

## 2022-10-20 MED ORDER — PIPERACILLIN-TAZOBACTAM IN DEX 2-0.25 GM/50ML IV SOLN
2.2500 g | Freq: Three times a day (TID) | INTRAVENOUS | Status: DC
  Administered 2022-10-20 – 2022-10-24 (×10): 2.25 g via INTRAVENOUS
  Filled 2022-10-20 (×12): qty 50

## 2022-10-20 MED ORDER — POLYETHYLENE GLYCOL 3350 17 G PO PACK: 17.0000 g | PACK | Freq: Every day | ORAL | Status: DC | PRN

## 2022-10-20 MED ORDER — IOHEXOL 350 MG/ML SOLN
75.0000 mL | Freq: Once | INTRAVENOUS | Status: AC | PRN
  Administered 2022-10-20: 75 mL via INTRAVENOUS

## 2022-10-20 MED ORDER — VANCOMYCIN HCL 2000 MG/400ML IV SOLN
2000.0000 mg | Freq: Once | INTRAVENOUS | Status: AC
  Administered 2022-10-20: 2000 mg via INTRAVENOUS
  Filled 2022-10-20: qty 400

## 2022-10-20 MED ORDER — HYDROMORPHONE HCL 1 MG/ML IJ SOLN
0.5000 mg | Freq: Once | INTRAMUSCULAR | Status: AC
  Administered 2022-10-21: 0.5 mg via INTRAVENOUS
  Filled 2022-10-20 (×2): qty 1

## 2022-10-20 MED ORDER — VANCOMYCIN HCL IN DEXTROSE 1-5 GM/200ML-% IV SOLN
1000.0000 mg | INTRAVENOUS | Status: AC
  Administered 2022-10-23 – 2022-10-29 (×4): 1000 mg via INTRAVENOUS
  Filled 2022-10-20 (×5): qty 200

## 2022-10-20 MED ORDER — ACETAMINOPHEN 325 MG PO TABS
650.0000 mg | ORAL_TABLET | Freq: Four times a day (QID) | ORAL | Status: DC | PRN
  Administered 2022-10-27: 650 mg via ORAL
  Filled 2022-10-20 (×4): qty 2

## 2022-10-20 MED ORDER — HEPARIN SODIUM (PORCINE) 5000 UNIT/ML IJ SOLN
5000.0000 [IU] | Freq: Three times a day (TID) | INTRAMUSCULAR | Status: DC
  Administered 2022-10-21 – 2022-11-01 (×29): 5000 [IU] via SUBCUTANEOUS
  Filled 2022-10-20 (×28): qty 1

## 2022-10-20 MED ORDER — ACETAMINOPHEN 650 MG RE SUPP: 650.0000 mg | Freq: Four times a day (QID) | RECTAL | Status: DC | PRN

## 2022-10-20 NOTE — ED Notes (Signed)
ED TO INPATIENT HANDOFF REPORT  ED Nurse Name and Phone #: Gillis Ends [phone]  S Name/Age/Gender [name] 67 y.o. female Room/Bed: 042C/042C  Code Status   Code Status: Prior  Home/SNF/Other Home Patient oriented to: self Is this baseline? No   Triage Complete: Triage complete  Chief Complaint Acute encephalopathy [G93.40]  Triage Note Pt arrived POV from home c/o AMS. Pt states random people brought her here and she has no idea why. Pt is a dialysis pt T,TH,S. She did not show up for dialysis which is why here family went to check on here and found her on the floor. Pt has no complaints but states she has no idea who the person is with her. The person with her at this time is her daughter in law.    Allergies Allergies  Allergen Reactions   Morphine Itching    Tolerated hydromorphone on 07/2020 *will take along with Benadryl*   Shellfish Allergy Anaphylaxis and Swelling   Diazepam Other (See Comments)    "felt like out of body experience"    Level of Care/Admitting Diagnosis ED Disposition     ED Disposition  Admit   Condition  --   Comment  Hospital Area: [facility] [[id number]]  Level of Care: Telemetry Medical [104]  May admit patient to [facility] or [facility] if equivalent level of care is available:: Yes  Covid Evaluation: Asymptomatic - no recent exposure (last 10 days) testing not required  Diagnosis: Acute encephalopathy [909311]  Admitting Physician: Katha Cabal [[id number]]  Attending Physician: Katha Cabal [[id number]]  Certification:: I certify there are rare and unusual circumstances requiring inpatient admission  Estimated Length of Stay: 2          B Medical/Surgery History Past Medical History:  Diagnosis Date   Anemia    Aortic stenosis    Echo 8/18: mean 13, peak 28, LVOT/AV mean velocity 0.51   Arthritis    Asthma    As a child    Bronchitis    CAD (coronary artery disease)    a. 09/2016: 50% Ost 1st Mrg  stenosis, 50% 2nd Mrg stenosis, 20% Mid-Cx, 95% Prox LAD, 40% mid-LAD, and 10% dist-LAD stenosis. Staged PCI with DES to Prox-LAD.    Chronic combined systolic and diastolic CHF (congestive heart failure) (HCC) 2011   echo 2/18: EF 55-60, normal wall motion, grade 2 diastolic dysfunction, trivial AI // echo 3/18: Septal and apical HK, EF 45-50, normal wall motion, trivial AI, mild LAE, PASP 38 // echo 8/18: EF 60-65, normal wall motion, grade 1 diastolic dysfunction, calcified aortic valve leaflets, mild aortic stenosis (mean 13, peak 28, LVOT/AV mean velocity 0.51), mild AI, moderate MAC, mild LAE, trivial TR    Chronic kidney disease    STAGE 4   Chronic kidney disease on chronic dialysis (HCC)    t, th, sat   Complication of anesthesia    Depression    Diabetes mellitus Dx 1989   Elevated lipids    GERD (gastroesophageal reflux disease)    Gout    Heart murmur    asymptomatic   Hepatitis C Dx 2013   Hypertension Dx 1989   Infected surgical wound    Lt arm   Myocardial infarction (HCC) 07/2015   Obesity    Pancreatitis 2013   Pneumonia    Refusal of blood transfusions as patient is Jehovah's Witness    pt states she is not [place] witness and does not refuse blood products  Tendinitis    Tremors of nervous system    LEFT HAND   Ulcer 2010   Past Surgical History:  Procedure Laterality Date   A/V FISTULAGRAM Left 04/11/2019   Procedure: A/V FISTULAGRAM;  Surgeon: Renford Dills, MD;  Location: [facility] IN[facility]ASIVE CV [facility];  Service: Cardiovascular;  Laterality: Left;   A/V FISTULAGRAM Left 06/02/2019   Procedure: A/V FISTULAGRAM;  Surgeon: Renford Dills, MD;  Location: [facility];  Service: Cardiovascular;  Laterality: Left;   APPLICATION OF WOUND VAC Left 08/14/2017   Procedure: APPLICATION OF WOUND VAC Exchange;  Surgeon: Earline Mayotte, MD;  Location: [facility] ORS;  Service: General;  Laterality: Left;   APPLICATION OF WOUND VAC Left 12/21/2018   Procedure:  APPLICATION OF WOUND VAC;  Surgeon: Renford Dills, MD;  Location: [facility] ORS;  Service: Vascular;  Laterality: Left;   AV FISTULA PLACEMENT Left 08/19/2018   Procedure: ARTERIOVENOUS (AV) FISTULA CREATION ( BRACHIOBASILIC );  Surgeon: Renford Dills, MD;  Location: [facility] OR[facility];  Service: Vascular;  Laterality: Left;   BASCILIC VEIN TRANSPOSITION Left 11/18/2018   Procedure: BASCILIC VEIN TRANSPOSITION;  Surgeon: Renford Dills, MD;  Location: [facility] OR[facility];  Service: Vascular;  Laterality: Left;   BIOPSY  09/20/2020   Procedure: BIOPSY;  Surgeon: Jeani Hawking, MD;  Location: [facility] ENDOSCOPY;  Service: Endoscopy;;   CHOLECYSTECTOMY     COLONOSCOPY WITH PROPOFOL N/A 02/03/2018   Procedure: COLONOSCOPY WITH PROPOFOL;  Surgeon: Toney Reil, MD;  Location: [facility] ENDOSCOPY;  Service: Gastroenterology;  Laterality: N/A;   COLONOSCOPY WITH PROPOFOL N/A 07/25/2022   Procedure: COLONOSCOPY WITH PROPOFOL;  Surgeon: Regis Bill, MD;  Location: [facility] ENDOSCOPY;  Service: Endoscopy;  Laterality: N/A;   CORONARY ANGIOPLASTY  07/2015   STENT   CORONARY STENT INTERVENTION N/A 09/18/2016   Procedure: Coronary Stent Intervention;  Surgeon: Lennette Bihari, MD;  [facility]: [facility];  Service: Cardiovascular;  Laterality: N/A;   DIALYSIS/PERMA CATHETER INSERTION N/A 05/10/2018   Procedure: DIALYSIS/PERMA CATHETER INSERTION;  Surgeon: Renford Dills, MD;  Location: [facility];  Service: Cardiovascular;  Laterality: N/A;   DRESSING CHANGE UNDER ANESTHESIA Left 08/15/2017   Procedure: exploration of wound for bleeding;  Surgeon: Earline Mayotte, MD;  Location: [facility];  Service: General;  Laterality: Left;   ESOPHAGOGASTRODUODENOSCOPY (EGD) WITH PROPOFOL N/A 02/03/2018   Procedure: ESOPHAGOGASTRODUODENOSCOPY (EGD) WITH PROPOFOL;  Surgeon: Toney Reil, MD;  Location: [facility] ENDOSCOPY;  Service: Gastroenterology;  Laterality: N/A;   ESOPHAGOGASTRODUODENOSCOPY (EGD) WITH PROPOFOL N/A  09/20/2020   Procedure: ESOPHAGOGASTRODUODENOSCOPY (EGD) WITH PROPOFOL;  Surgeon: Jeani Hawking, MD;  Location: [facility] ENDOSCOPY;  Service: Endoscopy;  Laterality: N/A;   ESOPHAGOGASTRODUODENOSCOPY (EGD) WITH PROPOFOL N/A 07/25/2022   Procedure: ESOPHAGOGASTRODUODENOSCOPY (EGD) WITH PROPOFOL;  Surgeon: Regis Bill, MD;  Location: [facility] ENDOSCOPY;  Service: Endoscopy;  Laterality: N/A;   EYE SURGERY  11/17/2018   INCISION AND DRAINAGE ABSCESS Left 08/12/2017   Procedure: INCISION AND DRAINAGE ABSCESS;  Surgeon: Earline Mayotte, MD;  Location: [facility];  Service: General;  Laterality: Left;   KNEE ARTHROSCOPY     LEFT HEART CATH N/A 09/18/2016   Procedure: Left Heart Cath;  Surgeon: Lennette Bihari, MD;  [facility];  Service: Cardiovascular;  Laterality: N/A;   LEFT HEART CATH AND CORONARY ANGIOGRAPHY N/A 09/16/2016   Procedure: Left Heart Cath and Coronary Angiography;  Surgeon: Kathleene Hazel, MD;  Location: [facility] INVASIVE CV LAB;  Service: Cardiovascular;  Laterality:  N/A;   LEFT HEART CATH AND CORONARY ANGIOGRAPHY N/A 04/29/2017   Procedure: LEFT HEART CATH AND CORONARY ANGIOGRAPHY;  Surgeon: Yvonne Kendall, MD;  [facility];  Service: Cardiovascular;  Laterality: N/A;   LOWER EXTREMITY ANGIOGRAPHY Right 03/08/2018   Procedure: LOWER EXTREMITY ANGIOGRAPHY;  Surgeon: Renford Dills, MD;  Location: [facility];  Service: Cardiovascular;  Laterality: Right;   TUBAL LIGATION     TUBAL LIGATION     UPPER EXTREMITY ANGIOGRAPHY Right 09/19/2019   Procedure: UPPER EXTREMITY ANGIOGRAPHY;  Surgeon: Renford Dills, MD;  Location: [facility];  Service: Cardiovascular;  Laterality: Right;   WOUND DEBRIDEMENT Left 12/21/2018   Procedure: DEBRIDEMENT WOUND;  Surgeon: Renford Dills, MD;  Location: [facility] OR[facility];  Service: Vascular;  Laterality: Left;   WOUND DEBRIDEMENT Left 12/30/2018   Procedure: DEBRIDEMENT WOUND WITH VAC PLACEMENT (LEFT UPPER  EXTREMITY);  Surgeon: Renford Dills, MD;  Location: [facility];  Service: Vascular;  Laterality: Left;     A IV Location/Drains/Wounds Patient Lines/Drains/Airways Status     Active Line/Drains/Airways     Name Placement date Placement time Site Days   Peripheral IV 10/20/22 18 G Anterior;Distal;Right;Upper Arm 10/20/22  1536  Arm  less than 1   Fistula / Graft Left Upper arm Arteriovenous fistula 07/24/22  0745  Upper arm  88   Pressure Injury 07/24/22 Heel Left Stage 2 -  Partial thickness loss of dermis presenting as a shallow open injury with a red, pink wound bed without slough. serous blister left heel 07/24/22  0946  -- 88            Intake/Output Last 24 hours No intake or output data in the 24 hours ending 10/20/22 2001  Labs/Imaging Results for orders placed or performed during the hospital encounter of 10/20/22 (from the past 48 hour(s))  Comprehensive metabolic panel     Status: Abnormal   Collection Time: 10/20/22  2:35 PM  Result Value Ref Range   Sodium 134 (L) 135 - 145 mmol/L   Potassium 4.7 3.5 - 5.1 mmol/L   Chloride 93 (L) 98 - 111 mmol/L   CO2 23 22 - 32 mmol/L   Glucose, Bld 219 (H) 70 - 99 mg/dL    Comment: Glucose reference range applies only to samples taken after fasting for at least 8 hours.   BUN 65 (H) 8 - 23 mg/dL   Creatinine, Ser 16.10 (H) 0.44 - 1.00 mg/dL   Calcium 8.6 (L) 8.9 - 10.3 mg/dL   Total Protein 7.0 6.5 - 8.1 g/dL   Albumin 3.5 3.5 - 5.0 g/dL   AST 15 15 - 41 U/L   ALT 18 0 - 44 U/L   Alkaline Phosphatase 273 (H) 38 - 126 U/L   Total Bilirubin 0.4 0.3 - 1.2 mg/dL   GFR, Estimated 3 (L) >60 mL/min    Comment: (NOTE) Calculated using the CKD-EPI Creatinine Equation (2021)    Anion gap 18 (H) 5 - 15    Comment: Performed at [facility], [address] [place], [place] [address]  CBC     Status: Abnormal   Collection Time: 10/20/22  2:35 PM  Result Value Ref Range   WBC 9.2 4.0 - 10.5 K/uL   RBC 4.55 3.87 - 5.11  MIL/uL   Hemoglobin 12.7 12.0 - 15.0 g/dL   HCT 40.9 81.1 - 91.4 %   MCV 90.1 80.0 - 100.0 fL   MCH 27.9  26.0 - 34.0 pg   MCHC 31.0 30.0 - 36.0 g/dL   RDW 16.1 (H) 09.6 - 04.5 %   Platelets 173 150 - 400 K/uL    Comment: REPEATED TO VERIFY   nRBC 0.0 0.0 - 0.2 %    Comment: Performed at [facility], [address], [place], [place] [address]  CBG monitoring, ED     Status: Abnormal   Collection Time: 10/20/22  2:50 PM  Result Value Ref Range   Glucose-Capillary 202 (H) 70 - 99 mg/dL    Comment: Glucose reference range applies only to samples taken after fasting for at least 8 hours.  I-Stat venous blood gas, ([facility] ED, [facility], [facility])     Status: Abnormal   Collection Time: 10/20/22  3:44 PM  Result Value Ref Range   pH, Ven 7.417 7.25 - 7.43   pCO2, Ven 37.0 (L) 44 - 60 mmHg   pO2, Ven 61 (H) 32 - 45 mmHg   Bicarbonate 23.8 20.0 - 28.0 mmol/L   TCO2 25 22 - 32 mmol/L   O2 Saturation 91 %   Acid-Base Excess 0.0 0.0 - 2.0 mmol/L   Sodium 134 (L) 135 - 145 mmol/L   Potassium 4.7 3.5 - 5.1 mmol/L   Calcium, Ion 0.94 (L) 1.15 - 1.40 mmol/L   HCT 45.0 36.0 - 46.0 %   Hemoglobin 15.3 (H) 12.0 - 15.0 g/dL   Sample type VENOUS   I-stat chem 8, ED (not at [facility], [facility] or [facility])     Status: Abnormal   Collection Time: 10/20/22  3:45 PM  Result Value Ref Range   Sodium 134 (L) 135 - 145 mmol/L   Potassium 4.7 3.5 - 5.1 mmol/L   Chloride 101 98 - 111 mmol/L   BUN 61 (H) 8 - 23 mg/dL   Creatinine, Ser 19.14 (H) 0.44 - 1.00 mg/dL   Glucose, Bld 782 (H) 70 - 99 mg/dL    Comment: Glucose reference range applies only to samples taken after fasting for at least 8 hours.   Calcium, Ion 0.91 (L) 1.15 - 1.40 mmol/L   TCO2 24 22 - 32 mmol/L   Hemoglobin 15.6 (H) 12.0 - 15.0 g/dL   HCT 95.6 21.3 - 08.6 %  Ammonia     Status: None   Collection Time: 10/20/22  4:00 PM  Result Value Ref Range   Ammonia 17 9 - 35 umol/L    Comment: Performed at [facility], [address], [place], [place]  [address]  Lactic acid, plasma     Status: None   Collection Time: 10/20/22  4:01 PM  Result Value Ref Range   Lactic Acid, Venous 1.5 0.5 - 1.9 mmol/L    Comment: Performed at [facility], [address] [place], [place] [address]  Rapid urine drug screen (hospital performed)     Status: None   Collection Time: 10/20/22  4:24 PM  Result Value Ref Range   Opiates NONE DETECTED NONE DETECTED   Cocaine NONE DETECTED NONE DETECTED   Benzodiazepines NONE DETECTED NONE DETECTED   Amphetamines NONE DETECTED NONE DETECTED   Tetrahydrocannabinol NONE DETECTED NONE DETECTED   Barbiturates NONE DETECTED NONE DETECTED    Comment: (NOTE) DRUG SCREEN FOR MEDICAL PURPOSES ONLY.  IF CONFIRMATION IS NEEDED FOR ANY PURPOSE, NOTIFY LAB WITHIN 5 DAYS.  LOWEST DETECTABLE LIMITS FOR URINE DRUG SCREEN Drug Class  Cutoff (ng/mL) Amphetamine and metabolites    1000 Barbiturate and metabolites    200 Benzodiazepine                 200 Opiates and metabolites        300 Cocaine and metabolites        300 THC                            50 Performed at [facility], [address] [place], [place] [address]   Urinalysis, Routine w reflex microscopic -Urine, Clean Catch     Status: Abnormal   Collection Time: 10/20/22  4:25 PM  Result Value Ref Range   [facility] YE[facility]   APPearance CLEAR CLEAR   Specific Gravity, Urine 1.014 1.005 - 1.030   pH 6.0 5.0 - 8.0   Glucose, UA 150 (A) NEGATIVE mg/dL   Hgb urine dipstick SMALL (A) NEGATIVE   Bilirubin Urine NEGATIVE NEGATIVE   Ketones, ur NEGATIVE NEGATIVE mg/dL   Protein, ur 811100 (A) NEGATIVE mg/dL   Nitrite NEGATIVE NEGATIVE   Leukocytes,Ua NEGATIVE NEGATIVE   RBC / HPF 0-5 0 - 5 RBC/hpf   WBC, UA 0-5 0 - 5 WBC/hpf   Bacteria, UA NONE SEEN NONE SEEN   Squamous Epithelial / HPF 0-5 0 - 5 /HPF   Mucus PRESENT     Comment: Performed at [facility], [address] [place], [place] [address]  Ethanol      Status: None   Collection Time: 10/20/22  6:00 PM  Result Value Ref Range   Alcohol, Ethyl (B) <10 <10 mg/dL    Comment: (NOTE) Lowest detectable limit for serum alcohol is 10 mg/dL.  For medical purposes only. Performed at [facility], [address] [place], [place] [address]   Acetaminophen level     Status: Abnormal   Collection Time: 10/20/22  6:00 PM  Result Value Ref Range   Acetaminophen (Tylenol), Serum <10 (L) 10 - 30 ug/mL    Comment: (NOTE) Therapeutic concentrations vary significantly. A range of 10-30 ug/mL  may be an effective concentration for many patients. However, some  are best treated at concentrations outside of this range. Acetaminophen concentrations >150 ug/mL at 4 hours after ingestion  and >50 ug/mL at 12 hours after ingestion are often associated with  toxic reactions.  Performed at [facility], [address] [place], [place] [address]    *Note: Due to a large number of results and/or encounters for the requested time period, some results have not been displayed. A complete set of results can be found in Results Review.   CT ABDOMEN PELVIS W CONTRAST  Result Date: 10/20/2022 CLINICAL DATA:  Acute abdominal pain EXAM: CT ABDOMEN AND PELVIS WITH CONTRAST TECHNIQUE: Multidetector CT imaging of the abdomen and pelvis was performed using the standard protocol following bolus administration of intravenous contrast. RADIATION DOSE REDUCTION: This exam was performed according to the departmental dose-optimization program which includes automated exposure control, adjustment of the mA and/or kV according to patient size and/or use of iterative reconstruction technique. CONTRAST:  75mL OMNIPAQUE IOHEXOL 350 MG/ML SOLN COMPARISON:  CT abdomen and pelvis 05/26/2022 FINDINGS: Lower chest: There is atelectasis in the lung bases. Hepatobiliary: There is questionable nodular liver contour. Gallbladder surgically absent. No biliary ductal dilatation.  Pancreas: Unremarkable. No pancreatic ductal dilatation or surrounding inflammatory changes. Spleen: Normal in size without  focal abnormality. Adrenals/Urinary Tract: There are numerous bilateral renal cortical cysts and hypodensities which are too small to characterize measuring up to 12 mm. There is no hydronephrosis or perinephric fat stranding. The adrenal glands and bladder are within normal limits. Stomach/Bowel: There is diffuse colonic wall thickening with surrounding inflammation more prominent in the ascending and transverse colon. There is no pneumatosis, bowel obstruction or free air. The appendix is not visualized small bowel and stomach are within normal limits. Stomach is within normal limits. Vascular/Lymphatic: Aortic atherosclerosis. No enlarged abdominal or pelvic lymph nodes. Reproductive: Uterus and bilateral adnexa are unremarkable. Other: Small fat containing umbilical hernia.  No ascites. Musculoskeletal: No acute fractures are seen. IMPRESSION: 1. Pancolitis. 2. Questionable nodular liver contour. Correlate clinically for cirrhosis. 3. Bilateral renal cysts and hypodensities which are too small to characterize. No follow-up imaging necessary. Aortic Atherosclerosis (ICD10-I70.0). Electronically Signed   By: Darliss Cheney M.D.   On: 10/20/2022 17:29   CT Head Wo Contrast  Result Date: 10/20/2022 CLINICAL DATA:  Mental status change of unknown cause. Patient found on the floor. EXAM: CT HEAD WITHOUT CONTRAST CT CERVICAL SPINE WITHOUT CONTRAST TECHNIQUE: Multidetector CT imaging of the head and cervical spine was performed following the standard protocol without intravenous contrast. Multiplanar CT image reconstructions of the cervical spine were also generated. RADIATION DOSE REDUCTION: This exam was performed according to the departmental dose-optimization program which includes automated exposure control, adjustment of the mA and/or kV according to patient size and/or use of iterative  reconstruction technique. COMPARISON:  05/26/2022. FINDINGS: CT HEAD FINDINGS Brain: No evidence of acute infarction, hemorrhage, hydrocephalus, extra-axial collection or mass lesion/mass effect. Vascular: No hyperdense vessel or unexpected calcification. Skull: Normal. Negative for fracture or focal lesion. Sinuses/Orbits: Globes and orbits are unremarkable. Mild mucosal thickening along the floor the right maxillary sinus. Sinuses otherwise clear. Other: None. CT CERVICAL SPINE FINDINGS Alignment: Slight reversal the normal cervical lordosis, apex at C5. No spondylolisthesis. Skull base and vertebrae: No acute fracture. No primary bone lesion or focal pathologic process. Soft tissues and spinal canal: No prevertebral fluid or swelling. No visible canal hematoma. Disc levels: Mild loss of disc height at C2-C3. Moderate loss of disc height at C5-C6 and C6-C7. Mild disc bulging. No convincing disc herniation. No significant stenosis. Upper chest: Upper lung ground-glass opacities suspected to be due to air trapping and small airways disease, but are nonspecific. Other: None. IMPRESSION: HEAD CT 1. No acute intracranial abnormalities CERVICAL CT 1. No fracture or acute skeletal abnormality Electronically Signed   By: Amie Portland M.D.   On: 10/20/2022 17:29   CT Cervical Spine Wo Contrast  Result Date: 10/20/2022 CLINICAL DATA:  Mental status change of unknown cause. Patient found on the floor. EXAM: CT HEAD WITHOUT CONTRAST CT CERVICAL SPINE WITHOUT CONTRAST TECHNIQUE: Multidetector CT imaging of the head and cervical spine was performed following the standard protocol without intravenous contrast. Multiplanar CT image reconstructions of the cervical spine were also generated. RADIATION DOSE REDUCTION: This exam was performed according to the departmental dose-optimization program which includes automated exposure control, adjustment of the mA and/or kV according to patient size and/or use of iterative  reconstruction technique. COMPARISON:  05/26/2022. FINDINGS: CT HEAD FINDINGS Brain: No evidence of acute infarction, hemorrhage, hydrocephalus, extra-axial collection or mass lesion/mass effect. Vascular: No hyperdense vessel or unexpected calcification. Skull: Normal. Negative for fracture or focal lesion. Sinuses/Orbits: Globes and orbits are unremarkable. Mild mucosal thickening along the floor the right maxillary sinus. Sinuses otherwise clear.  Other: None. CT CERVICAL SPINE FINDINGS Alignment: Slight reversal the normal cervical lordosis, apex at C5. No spondylolisthesis. Skull base and vertebrae: No acute fracture. No primary bone lesion or focal pathologic process. Soft tissues and spinal canal: No prevertebral fluid or swelling. No visible canal hematoma. Disc levels: Mild loss of disc height at C2-C3. Moderate loss of disc height at C5-C6 and C6-C7. Mild disc bulging. No convincing disc herniation. No significant stenosis. Upper chest: Upper lung ground-glass opacities suspected to be due to air trapping and small airways disease, but are nonspecific. Other: None. IMPRESSION: HEAD CT 1. No acute intracranial abnormalities CERVICAL CT 1. No fracture or acute skeletal abnormality Electronically Signed   By: Amie Portland M.D.   On: 10/20/2022 17:29   DG Foot Complete Left  Result Date: 10/20/2022 CLINICAL DATA:  Foot infection. EXAM: LEFT FOOT - COMPLETE 3+ VIEW COMPARISON:  Left foot radiographs 07/22/2022 FINDINGS: Sequelae of interval great toe amputation are identified at the level of the MTP joint. There is gas/a wound within the overlying soft tissues at the amputation site. Subtle marginal erosions are questioned involving the first metatarsal head. There is a minimally displaced intra-articular fracture of the base of the proximal phalanx of the second toe which is new from the prior study but of indeterminate acuity. Diffuse soft tissue swelling is noted in the forefoot. There are atherosclerotic  vascular calcifications. IMPRESSION: 1. Interval great toe amputation with overlying soft tissue wound. Questionable subtle marginal erosion at the first metatarsal head for which early osteomyelitis cannot be excluded. 2. Minimally displaced intra-articular fracture of the base of the second toe proximal phalanx, of indeterminate acuity. Electronically Signed   By: [name] M.D.   On: 10/20/2022 16:18   DG Chest 2 View  Result Date: 10/20/2022 CLINICAL DATA:  Altered mental status.  Left foot infection. EXAM: CHEST - 2 VIEW COMPARISON:  07/22/2022 FINDINGS: Mild elevation of the right hemidiaphragm is unchanged. Chronic linear densities at both lung bases are suggestive for areas of scarring. No new airspace disease or lung consolidation. Trachea is midline. Heart and mediastinum are within normal limits. Atherosclerotic calcifications at the aortic arch. No large pleural effusions. IMPRESSION: Bibasilar chest densities appear to be chronic and most compatible with areas of scarring. No acute chest findings. Electronically Signed   By: Richarda Overlie M.D.   On: 10/20/2022 16:11    Pending Labs Unresulted Labs (From admission, onward)     Start     Ordered   10/20/22 1518  Urine Culture  Once,   URGENT       Question:  Indication  Answer:  Dysuria   10/20/22 1517   10/20/22 1517  Blood culture (routine x 2)  BLOOD CULTURE X 2,   R      10/20/22 1516            Vitals/Pain Today's Vitals   10/20/22 1838 10/20/22 1900 10/20/22 1915 10/20/22 1930  BP: (!) 157/92 (!) 168/74 (!) 178/92 (!) 176/89  Pulse: 86     Resp: 18 14 10 12   Temp:      TempSrc:      SpO2: 97%     Weight:      Height:      PainSc: Asleep       Isolation Precautions No active isolations  Medications Medications  vancomycin (VANCOREADY) IVPB 2000 mg/400 mL (has no administration in time range)  piperacillin-tazobactam (ZOSYN) IVPB 2.25 g (has no administration in time range)  vancomycin (VANCOCIN) IVPB 1000  mg/200 mL premix (has no administration in time range)  iohexol (OMNIPAQUE) 350 MG/ML injection 75 mL (75 mLs Intravenous Contrast Given 10/20/22 1706)    Mobility non-ambulatory     Focused Assessments Renal Assessment Handoff:  Hemodialysis Schedule: Hemodialysis Schedule: Tuesday/Thursday/Saturday Last Hemodialysis date and time: Thursday   Restricted appendage: left arm   R Recommendations: See Admitting Provider Note  Report given to:   Additional Notes:

## 2022-10-20 NOTE — Progress Notes (Signed)
Pharmacy Antibiotic Note  [name] is a 67 y.o. female admitted on 10/20/2022 with  left foot infection .  Pharmacy has been consulted for vancomycin and Zosyn dosing.  Patient is ESRD on HD T/Th/Sat. She did not go to HD today. Per family, the patient had a toe amputation ~2 weeks ago. DG foot from today is suspicious of osteomyelitis.   Plan: - Give vancomycin 2g IV x1 now, then vancomycin 1g IV with every HD session  - Start Zosyn 2.25g IV q8hrs  - Monitor cultures and overall clinical picture   Height: 5\' 5"  (165.1 cm) Weight: 104.3 kg (230 lb) IBW/kg (Calculated) : 57  Temp (24hrs), Avg:98.3 F (36.8 C), Min:98.3 F (36.8 C), Max:98.3 F (36.8 C)  Recent Labs  Lab 10/20/22 1435 10/20/22 1545 10/20/22 1601  WBC 9.2  --   --   CREATININE 12.18* 13.30*  --   LATICACIDVEN  --   --  1.5    Estimated Creatinine Clearance: 5.1 mL/min (A) (by C-G formula based on SCr of 13.3 mg/dL (H)).    Allergies  Allergen Reactions   Morphine Itching    Tolerated hydromorphone on 07/2020 *will take along with Benadryl*   Shellfish Allergy Anaphylaxis and Swelling   Diazepam Other (See Comments)    "felt like out of body experience"    Antimicrobials this admission: 4/9 vancomycin >>  4/9 Zosyn >>   Dose adjustments this admission: N/A  Microbiology results: 4/9 BCx: in process 4/9 UCx: in process    Thank you for allowing pharmacy to be a part of this patient's care.  Cherylin Mylar, PharmD PGY1 Pharmacy Resident 4/9/20246:44 PM

## 2022-10-20 NOTE — ED Triage Notes (Signed)
Pt arrived POV from home c/o AMS. Pt states random people brought her here and she has no idea why. Pt is a dialysis pt T,TH,S. She did not show up for dialysis which is why here family went to check on here and found her on the floor. Pt has no complaints but states she has no idea who the person is with her. The person with her at this time is her daughter in law.

## 2022-10-20 NOTE — ED Provider Notes (Signed)
[place] [facility]   CSN: [id number] Arrival date & time: 10/20/22  1355     History  Chief Complaint  Patient presents with   Altered Mental Status    Heather Cheron SchaumannM [name] is a 67 y.o. female with a past medical history of hypertension, hyperlipidemia, heart failure, GERD, asthma and ESRD presenting today with altered mental status.  Patient is alert but disoriented, even to self.   Per patient's daughter-in-law at the bedside patient is an ESRD patient on TTS dialysis.  Last dialysis was on Saturday.  This morning family got a phone call from a neighbor at around 730 stating that they had heard a large boom in the patient's apartment where she lives alone.  They were able to get into the apartment around an hour later and the patient was lying on the floor, confused and in her stool.  They report that she had a toe amputation 2 weeks ago but no other changes in her normal health.  They say that she keeps calling different family members different names and saying that she does not know them and that she has been complaining about some abdominal pain stating that she is having a baby and needs to push.  They deny any alcohol or drug use.     Altered Mental Status      Home Medications Prior to Admission medications   Medication Sig Start Date End Date Taking? Authorizing Provider  albuterol (VENTOLIN HFA) 108 (90 Base) MCG/ACT inhaler Inhale 2 puffs into the lungs every 4 (four) hours as needed for wheezing or shortness of breath. 06/28/20   Margaretann LovelessBurnette, Jennifer M, PA-C  allopurinol (ZYLOPRIM) 100 MG tablet TAKE 1 TABLET BY MOUTH ONCE DAILY. 07/16/21   Malva LimesFisher, Donald E, MD  aspirin 81 MG EC tablet Take 1 tablet (81 mg total) by mouth daily. 09/20/20   Leroy SeaSingh, Prashant K, MD  atorvastatin (LIPITOR) 80 MG tablet Take 1 tablet (80 mg total) by mouth every evening. 04/24/21   Erasmo DownerBacigalupo, Angela M, MD  carvedilol (COREG) 12.5 MG tablet Take 1  tablet (12.5 mg total) by mouth 2 (two) times daily with a meal. 09/19/21   Paige, Lucas MallowVictoria J, DO  Continuous Blood Gluc Receiver (FREESTYLE LIBRE 14 DAY READER) DEVI To check blood sugar ACHS 09/27/20   Margaretann LovelessBurnette, Jennifer M, PA-C  Continuous Blood Gluc Sensor (FREESTYLE LIBRE 14 DAY SENSOR) MISC To check blood sugar ACHS; change every 14 days 09/27/20   Margaretann LovelessBurnette, Jennifer M, PA-C  fluticasone [facility](FLONASE) 50 MCG/ACT nasal spray Place 2 sprays into both nostrils daily as needed for allergies or rhinitis. 09/28/17   Margaretann LovelessBurnette, Jennifer M, PA-C  fluticasone furoate-vilanterol (BREO ELLIPTA) 200-25 MCG/INH AEPB Inhale 1 puff into the lungs daily. Patient not taking: Reported on 04/15/2022 08/30/20   Margaretann LovelessBurnette, Jennifer M, PA-C  Glucose Blood (BLOOD GLUCOSE TEST STRIPS 333) STRP Enough test stripes to test blood sugar TID x 30 days. No refills 04/16/22   Charise KillianWilliams, Jamiese M, MD  HUMULIN 70/30 KWIKPEN (70-30) 100 UNIT/ML KwikPen Inject 30 Units into the skin 2 (two) times daily. 05/26/22   [provider]  hydrOXYzine (ATARAX/VISTARIL) 25 MG tablet Take 1 tablet (25 mg total) by mouth 4 (four) times daily. Patient taking differently: Take 25 mg by mouth 3 (three) times daily as needed. 12/03/20   Bacigalupo, Marzella SchleinAngela M, MD  Insulin NPH, Human,, Isophane, (NOVOLIN N FLEXPEN) 100 UNIT/ML Kiwkpen Inject 100 Units into the skin every morning. 05/26/22 06/25/22  Gwyneth Sprout, MD  Insulin Pen Needle 32G X 4 MM MISC Enough pen needles for 100 units of novolin QAM x 30 days. No refills 04/16/22   Charise Killian, MD  LEVEMIR FLEXPEN 100 UNIT/ML FlexPen Inject 60 Units into the skin daily. Patient not taking: Reported on 07/23/2022    [provider]  methocarbamol (ROBAXIN) 500 MG tablet Take 1 tablet (500 mg total) by mouth 2 (two) times daily. Patient not taking: Reported on 07/23/2022 05/26/22   Gwyneth Sprout, MD  midodrine (PROAMATINE) 10 MG tablet Take 10 mg by mouth daily. 12/23/21   [provider]  naloxone [facility]) nasal spray 4 mg/0.1 mL Place 1 spray into the nose as directed. 04/08/22   [provider]  nitroGLYCERIN (NITROSTAT) 0.4 MG SL tablet Place 1 tablet (0.4 mg total) under the tongue every 5 (five) minutes x 3 doses as needed for chest pain. 05/03/20   Margaretann Loveless, PA-C  Oxycodone HCl 10 MG TABS Take 10 mg by mouth every 6 (six) hours. 04/01/22   [provider]  pregabalin (LYRICA) 50 MG capsule Take 50 mg by mouth 2 (two) times daily. 06/12/22   [provider]  valACYclovir (VALTREX) 500 MG tablet TAKE (1) TABLET BY MOUTH EVERY OTHER DAY. Patient taking differently: Take 500 mg by mouth every other day. 01/24/21   Bacigalupo, Marzella Schlein, MD  VELPHORO 500 MG chewable tablet Chew 1,500 mg by mouth 3 (three) times daily. 09/11/21   [provider]  gabapentin (NEURONTIN) 100 MG capsule Take 1 capsule (100 mg total) by mouth 3 (three) times daily. 08/19/17 02/03/19  Enedina Finner, MD      Allergies    Morphine, Shellfish allergy, and Diazepam    Review of Systems   Review of Systems  Physical Exam Updated Vital Signs BP (!) 151/73 (BP Location: Right Arm)   Pulse 87   Temp 98.3 F (36.8 C) (Oral)   Resp 20   Ht 5\' 5"  (1.651 m)   Wt 104.3 kg   SpO2 97%   BMI 38.27 kg/m  Physical Exam Vitals and nursing note reviewed.  Constitutional:      Appearance: Normal appearance.  HENT:     Head: Normocephalic and atraumatic.  Eyes:     General: No scleral icterus.    Conjunctiva/sclera: Conjunctivae normal.  Pulmonary:     Effort: Pulmonary effort is normal. No respiratory distress.  Skin:    Findings: No rash.  Neurological:     Mental Status: She is alert.     Comments: Patient is awake however completely disoriented.  Moving all extremities but has some difficulty following exam instructions.  Mildly pinpoint pupils bilaterally.  No facial droop or aphasia.  EOMs appear to be intact  Psychiatric:        Mood and  Affect: Mood normal.     ED Results / Procedures / Treatments   Labs (all labs ordered are listed, but only abnormal results are displayed) Labs Reviewed  COMPREHENSIVE METABOLIC PANEL - Abnormal; Notable for the following components:      Result Value   Sodium 134 (*)    Chloride 93 (*)    Glucose, Bld 219 (*)    BUN 65 (*)    Creatinine, Ser 12.18 (*)    Calcium 8.6 (*)    Alkaline Phosphatase 273 (*)    GFR, Estimated 3 (*)    Anion gap 18 (*)    All other components within normal limits  CBC - Abnormal; Notable for the following components:   RDW 17.9 (*)    All other components within normal limits  URINALYSIS, ROUTINE W REFLEX MICROSCOPIC - Abnormal; Notable for the following components:   Glucose, UA 150 (*)    Hgb urine dipstick SMALL (*)    Protein, ur 100 (*)    All other components within normal limits  CBG MONITORING, ED - Abnormal; Notable for the following components:   Glucose-Capillary 202 (*)    All other components within normal limits  I-STAT VENOUS BLOOD GAS, ED - Abnormal; Notable for the following components:   pCO2, Ven 37.0 (*)    pO2, Ven 61 (*)    Sodium 134 (*)    Calcium, Ion 0.94 (*)    Hemoglobin 15.3 (*)    All other components within normal limits  I-STAT CHEM 8, ED - Abnormal; Notable for the following components:   Sodium 134 (*)    BUN 61 (*)    Creatinine, Ser 13.30 (*)    Glucose, Bld 205 (*)    Calcium, Ion 0.91 (*)    Hemoglobin 15.6 (*)    All other components within normal limits  CULTURE, BLOOD (ROUTINE X 2)  CULTURE, BLOOD (ROUTINE X 2)  URINE CULTURE  AMMONIA  LACTIC ACID, PLASMA  RAPID URINE DRUG SCREEN, HOSP PERFORMED  ETHANOL  ACETAMINOPHEN LEVEL    EKG EKG Interpretation  Date/Time:  Tuesday October 20 2022 17:49:58 EDT Ventricular Rate:  86 PR Interval:  154 QRS Duration: 95 QT Interval:  409 QTC Calculation: 490 R Axis:   63 Text Interpretation: Sinus rhythm Borderline prolonged QT interval Confirmed by  Virgina Norfolk ([id number]) on 10/20/2022 5:53:47 PM  Radiology CT ABDOMEN PELVIS W CONTRAST  Result Date: 10/20/2022 CLINICAL DATA:  Acute abdominal pain EXAM: CT ABDOMEN AND PELVIS WITH CONTRAST TECHNIQUE: Multidetector CT imaging of the abdomen and pelvis was performed using the standard protocol following bolus administration of intravenous contrast. RADIATION DOSE REDUCTION: This exam was performed according to the departmental dose-optimization program which includes automated exposure control, adjustment of the mA and/or kV according to patient size and/or use of iterative reconstruction technique. CONTRAST:  75mL OMNIPAQUE IOHEXOL 350 MG/ML SOLN COMPARISON:  CT abdomen and pelvis 05/26/2022 FINDINGS: Lower chest: There is atelectasis in the lung bases. Hepatobiliary: There is questionable nodular liver contour. Gallbladder surgically absent. No biliary ductal dilatation. Pancreas: Unremarkable. No pancreatic ductal dilatation or surrounding inflammatory changes. Spleen: Normal in size without focal abnormality. Adrenals/Urinary Tract: There are numerous bilateral renal cortical cysts and hypodensities which are too small to characterize measuring up to 12 mm. There is no hydronephrosis or perinephric fat stranding. The adrenal glands and bladder are within normal limits. Stomach/Bowel: There is diffuse colonic wall thickening with surrounding inflammation more prominent in the ascending and transverse colon. There is no pneumatosis, bowel obstruction or free air. The appendix is not visualized small bowel and stomach are within normal limits. Stomach is within normal limits. Vascular/Lymphatic: Aortic atherosclerosis. No enlarged abdominal or pelvic lymph nodes. Reproductive: Uterus and bilateral adnexa are unremarkable. Other: Small fat containing umbilical hernia.  No ascites. Musculoskeletal: No acute fractures are seen. IMPRESSION: 1. Pancolitis. 2. Questionable nodular liver contour. Correlate clinically for  cirrhosis. 3. Bilateral renal cysts and hypodensities which are too small to characterize. No follow-up imaging necessary. Aortic Atherosclerosis (ICD10-I70.0). Electronically Signed   By: Darliss Cheney M.D.   On: 10/20/2022 17:29   CT Head Wo Contrast  Result Date: 10/20/2022 CLINICAL  DATA:  Mental status change of unknown cause. Patient found on the floor. EXAM: CT HEAD WITHOUT CONTRAST CT CERVICAL SPINE WITHOUT CONTRAST TECHNIQUE: Multidetector CT imaging of the head and cervical spine was performed following the standard protocol without intravenous contrast. Multiplanar CT image reconstructions of the cervical spine were also generated. RADIATION DOSE REDUCTION: This exam was performed according to the departmental dose-optimization program which includes automated exposure control, adjustment of the mA and/or kV according to patient size and/or use of iterative reconstruction technique. COMPARISON:  05/26/2022. FINDINGS: CT HEAD FINDINGS Brain: No evidence of acute infarction, hemorrhage, hydrocephalus, extra-axial collection or mass lesion/mass effect. Vascular: No hyperdense vessel or unexpected calcification. Skull: Normal. Negative for fracture or focal lesion. Sinuses/Orbits: Globes and orbits are unremarkable. Mild mucosal thickening along the floor the right maxillary sinus. Sinuses otherwise clear. Other: None. CT CERVICAL SPINE FINDINGS Alignment: Slight reversal the normal cervical lordosis, apex at C5. No spondylolisthesis. Skull base and vertebrae: No acute fracture. No primary bone lesion or focal pathologic process. Soft tissues and spinal canal: No prevertebral fluid or swelling. No visible canal hematoma. Disc levels: Mild loss of disc height at C2-C3. Moderate loss of disc height at C5-C6 and C6-C7. Mild disc bulging. No convincing disc herniation. No significant stenosis. Upper chest: Upper lung ground-glass opacities suspected to be due to air trapping and small airways disease, but are  nonspecific. Other: None. IMPRESSION: HEAD CT 1. No acute intracranial abnormalities CERVICAL CT 1. No fracture or acute skeletal abnormality Electronically Signed   By: Amie Portland M.D.   On: 10/20/2022 17:29   CT Cervical Spine Wo Contrast  Result Date: 10/20/2022 CLINICAL DATA:  Mental status change of unknown cause. Patient found on the floor. EXAM: CT HEAD WITHOUT CONTRAST CT CERVICAL SPINE WITHOUT CONTRAST TECHNIQUE: Multidetector CT imaging of the head and cervical spine was performed following the standard protocol without intravenous contrast. Multiplanar CT image reconstructions of the cervical spine were also generated. RADIATION DOSE REDUCTION: This exam was performed according to the departmental dose-optimization program which includes automated exposure control, adjustment of the mA and/or kV according to patient size and/or use of iterative reconstruction technique. COMPARISON:  05/26/2022. FINDINGS: CT HEAD FINDINGS Brain: No evidence of acute infarction, hemorrhage, hydrocephalus, extra-axial collection or mass lesion/mass effect. Vascular: No hyperdense vessel or unexpected calcification. Skull: Normal. Negative for fracture or focal lesion. Sinuses/Orbits: Globes and orbits are unremarkable. Mild mucosal thickening along the floor the right maxillary sinus. Sinuses otherwise clear. Other: None. CT CERVICAL SPINE FINDINGS Alignment: Slight reversal the normal cervical lordosis, apex at C5. No spondylolisthesis. Skull base and vertebrae: No acute fracture. No primary bone lesion or focal pathologic process. Soft tissues and spinal canal: No prevertebral fluid or swelling. No visible canal hematoma. Disc levels: Mild loss of disc height at C2-C3. Moderate loss of disc height at C5-C6 and C6-C7. Mild disc bulging. No convincing disc herniation. No significant stenosis. Upper chest: Upper lung ground-glass opacities suspected to be due to air trapping and small airways disease, but are  nonspecific. Other: None. IMPRESSION: HEAD CT 1. No acute intracranial abnormalities CERVICAL CT 1. No fracture or acute skeletal abnormality Electronically Signed   By: Amie Portland M.D.   On: 10/20/2022 17:29   DG Foot Complete Left  Result Date: 10/20/2022 CLINICAL DATA:  Foot infection. EXAM: LEFT FOOT - COMPLETE 3+ VIEW COMPARISON:  Left foot radiographs 07/22/2022 FINDINGS: Sequelae of interval great toe amputation are identified at the level of the MTP joint. There is  gas/a wound within the overlying soft tissues at the amputation site. Subtle marginal erosions are questioned involving the first metatarsal head. There is a minimally displaced intra-articular fracture of the base of the proximal phalanx of the second toe which is new from the prior study but of indeterminate acuity. Diffuse soft tissue swelling is noted in the forefoot. There are atherosclerotic vascular calcifications. IMPRESSION: 1. Interval great toe amputation with overlying soft tissue wound. Questionable subtle marginal erosion at the first metatarsal head for which early osteomyelitis cannot be excluded. 2. Minimally displaced intra-articular fracture of the base of the second toe proximal phalanx, of indeterminate acuity. Electronically Signed   By: [name] M.D.   On: 10/20/2022 16:18   DG Chest 2 View  Result Date: 10/20/2022 CLINICAL DATA:  Altered mental status.  Left foot infection. EXAM: CHEST - 2 VIEW COMPARISON:  07/22/2022 FINDINGS: Mild elevation of the right hemidiaphragm is unchanged. Chronic linear densities at both lung bases are suggestive for areas of scarring. No new airspace disease or lung consolidation. Trachea is midline. Heart and mediastinum are within normal limits. Atherosclerotic calcifications at the aortic arch. No large pleural effusions. IMPRESSION: Bibasilar chest densities appear to be chronic and most compatible with areas of scarring. No acute chest findings. Electronically Signed   By: Richarda Overlie M.D.   On: 10/20/2022 16:11    Procedures Procedures   Medications Ordered in ED Medications - No data to display  ED Course/ Medical Decision Making/ A&P Clinical Course as of 10/20/22 1840  Tue Oct 20, 2022  1829 Spoke with the son at bedside.  Updated on the largely benign workup.  Patient is still altered.  Unable to state her name.  Family is agreeable to admission and IV antibiotics for her foot at this time. [MR]    Clinical Course User Index [MR] Lyllie Cobbins, Gabriel Cirri, PA-C                             Medical Decision Making Amount and/or Complexity of Data Reviewed Labs: ordered. Radiology: ordered.  Risk Prescription drug management. Decision regarding hospitalization.   67 year old female presenting today with altered mental status.  Differential includes but is not limited to CVA, acute intoxication, polypharmacy, psychiatric condition, HHS, urinary tract infection,, other infection.  This is not an exhaustive differential.    Past Medical History / Co-morbidities / Social History: ESRD, hepatitis C   Additional history: Per chart review patient has a history of hepatitis C.  She also has ESRD and is on dialysis.  She has chronic medications to include Robaxin, oxycodone and Vicodin.  Unfortunately patient is not able to communicate which are how much medication she is taking at this time.   Physical Exam: Pertinent physical exam findings include Patient is acutely altered.  Difficulty following exams.  Opens her mouth when I request that she open her eyes.  Continues to state that her name is Clarisse Gouge which her family member says that that is something she picked off off of the original nurse that introduced herself.  Patient keeps telling me that she does not know where she is and is not able to provide any meaningful history.  There is no facial droop or aphasia.  She is moving all extremities.  Lab Tests: I ordered, and personally interpreted labs.  The  pertinent results include: Negative UDS Negative urinalysis Normal ammonia Normal white count   Imaging Studies: CT head, C-spine  and abdomen pelvis  X-Portner of the foot inconclusive for osteomyelitis   Cardiac Monitoring:  The patient was maintained on a cardiac monitor.  I viewed and interpreted the cardiac monitored which showed an underlying rhythm of: Sinus   Medications: Vancomycin and Zosyn   Consultations Obtained: Spoke with Dr. Allena Katz with nephrology who will add the patient to the dialysis list.  MDM/Disposition: This is a 67 year old female who presented today with altered mental status.  Neighbor heard somebody fall around 730 this morning and when family arrived about an hour later she was on the floor in her stool.  On arrival patient was completely disoriented.  Unable to state her name or birthday.  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  Some pancolitis on CT scan however I do not believe is contributing to any of her symptoms today.  After workup today patient continues to be altered.  Her foot wound does not appear infected and x-Pino suspicious for osteomyelitis.  She will be started on Vanco and Zosyn for osteomyelitis /pancolitis and admitted to the hospital for these infections as well as AMS.  Nephrology paged to get the patient set up for dialysis.  Admit to Dr. Rachael Darby with [facility]      Final Clinical Impression(s) / ED Diagnoses Final diagnoses:  Other acute osteomyelitis of left foot  Encephalopathy  Pancolitis    Rx / DC Orders ED Discharge Orders     None         I discussed this case with my attending physician Dr. Lockie Mola who cosigned this note including patient's presenting symptoms, physical exam, and planned diagnostics and interventions. Attending physician stated agreement with plan or made changes to plan which were implemented.      Woodroe Chen 10/20/22 1904     Benjiman Core, MD 10/21/22 2336

## 2022-10-20 NOTE — ED Provider Notes (Signed)
I provided a substantive portion of the care of this patient.  I personally made/approved the management plan for this patient and take responsibility for the patient management.    Patient here with confusion, concern for left foot infection by family.  History of end-stage renal disease, diabetes, chronic left foot wound status post recent amputation.  Missed dialysis today.  Sounds like family last talked to her last night and today it sounds like neighbor heard a loud fall at the house.  Eventually someone was able to get in the house that she was found down on the ground.  She is awake and alert but she is having may be some confusion/hallucinations.  She somewhat seems to be more exhibiting someone who would be intoxicated.  She states that her belly hurts and that she is pregnant.  She says the wrong name when she looks at a family member.  She is identifying herself under the wrong name as well.  Focally neurologically she seems to be intact.  She has normal vitals.  Her left foot is warm and swollen compared to the right.  Maybe a little bit of purulence from the left big toe amputation site.  Family states that when she had infection in the foot a couple months ago was admitted with IV antibiotics she had some confusion and these type of hallucinations.  She does have a chronic narcotic prescription.  Differential is wide as this could be infectious related or polypharmacy or metabolic.  Not sure if she has some sort of concussion or brain process going on as well.  Does not seem to be stroke related.  Will do broad workup with infectious workup with CBC and blood culture and lactic acid x-Shinault of the left foot is CT scan of her head and neck as well as her abdomen and pelvis.  Will get a chest x-Hod[place]on.  Overall anticipate admission.  Will continue to help with medical decision making.  Her blood sugar was normal upon arrival.  I will review and interpret labs and imaging and EKG.  Please see my PA note for  further results, evaluation, disposition of the patient.  This chart was dictated using voice recognition software.  Despite best efforts to proofread,  errors can occur which can change the documentation meaning.    Virgina Norfolk, DO 10/20/22 1537

## 2022-10-21 ENCOUNTER — Inpatient Hospital Stay (HOSPITAL_COMMUNITY): Payer: 59

## 2022-10-21 ENCOUNTER — Encounter (HOSPITAL_COMMUNITY): Payer: Self-pay | Admitting: Family Medicine

## 2022-10-21 DIAGNOSIS — G9340 Encephalopathy, unspecified: Secondary | ICD-10-CM | POA: Diagnosis not present

## 2022-10-21 DIAGNOSIS — M86272 Subacute osteomyelitis, left ankle and foot: Secondary | ICD-10-CM

## 2022-10-21 DIAGNOSIS — I739 Peripheral vascular disease, unspecified: Secondary | ICD-10-CM

## 2022-10-21 DIAGNOSIS — N186 End stage renal disease: Secondary | ICD-10-CM | POA: Diagnosis not present

## 2022-10-21 DIAGNOSIS — M869 Osteomyelitis, unspecified: Secondary | ICD-10-CM | POA: Insufficient documentation

## 2022-10-21 DIAGNOSIS — K5100 Ulcerative (chronic) pancolitis without complications: Secondary | ICD-10-CM | POA: Insufficient documentation

## 2022-10-21 DIAGNOSIS — E1122 Type 2 diabetes mellitus with diabetic chronic kidney disease: Secondary | ICD-10-CM | POA: Diagnosis not present

## 2022-10-21 LAB — COMPREHENSIVE METABOLIC PANEL
ALT: 15 U/L (ref 0–44)
AST: 10 U/L — ABNORMAL LOW (ref 15–41)
Albumin: 3 g/dL — ABNORMAL LOW (ref 3.5–5.0)
Alkaline Phosphatase: 233 U/L — ABNORMAL HIGH (ref 38–126)
Anion gap: 14 (ref 5–15)
BUN: 66 mg/dL — ABNORMAL HIGH (ref 8–23)
CO2: 24 mmol/L (ref 22–32)
Calcium: 8.2 mg/dL — ABNORMAL LOW (ref 8.9–10.3)
Chloride: 98 mmol/L (ref 98–111)
Creatinine, Ser: 12.62 mg/dL — ABNORMAL HIGH (ref 0.44–1.00)
GFR, Estimated: 3 mL/min — ABNORMAL LOW (ref >60)
Glucose, Bld: 114 mg/dL — ABNORMAL HIGH (ref 70–99)
Potassium: 4.9 mmol/L (ref 3.5–5.1)
Sodium: 136 mmol/L (ref 135–145)
Total Bilirubin: 0.6 mg/dL (ref 0.3–1.2)
Total Protein: 6.3 g/dL — ABNORMAL LOW (ref 6.5–8.1)

## 2022-10-21 LAB — CBC
HCT: 38.4 % (ref 36.0–46.0)
Hemoglobin: 12 g/dL (ref 12.0–15.0)
MCH: 27.7 pg (ref 26.0–34.0)
MCHC: 31.3 g/dL (ref 30.0–36.0)
MCV: 88.7 fL (ref 80.0–100.0)
Platelets: 155 10*3/uL (ref 150–400)
RBC: 4.33 MIL/uL (ref 3.87–5.11)
RDW: 17.7 % — ABNORMAL HIGH (ref 11.5–15.5)
WBC: 8.4 10*3/uL (ref 4.0–10.5)
nRBC: 0 % (ref 0.0–0.2)

## 2022-10-21 LAB — C-REACTIVE PROTEIN: CRP: 1.1 mg/dL — ABNORMAL HIGH (ref <1.0)

## 2022-10-21 LAB — CULTURE, BLOOD (ROUTINE X 2): Culture: NO GROWTH

## 2022-10-21 LAB — MAGNESIUM: Magnesium: 2.3 mg/dL (ref 1.7–2.4)

## 2022-10-21 LAB — URINE CULTURE: Culture: NO GROWTH

## 2022-10-21 LAB — GLUCOSE, CAPILLARY
Glucose-Capillary: 106 mg/dL — ABNORMAL HIGH (ref 70–99)
Glucose-Capillary: 110 mg/dL — ABNORMAL HIGH (ref 70–99)
Glucose-Capillary: 103 mg/dL — ABNORMAL HIGH (ref 70–99)
Glucose-Capillary: 112 mg/dL — ABNORMAL HIGH (ref 70–99)

## 2022-10-21 LAB — SEDIMENTATION RATE: Sed Rate: 26 mm/hr — ABNORMAL HIGH (ref 0–22)

## 2022-10-21 LAB — TROPONIN I (HIGH SENSITIVITY): Troponin I (High Sensitivity): 25 ng/L — ABNORMAL HIGH (ref <18)

## 2022-10-21 LAB — MRSA NEXT GEN BY PCR, NASAL: MRSA by PCR Next Gen: DETECTED — AB

## 2022-10-21 LAB — HEPATITIS B SURFACE ANTIGEN: Hepatitis B Surface Ag: NONREACTIVE

## 2022-10-21 MED ORDER — MUPIROCIN 2 % EX OINT
1.0000 | TOPICAL_OINTMENT | Freq: Two times a day (BID) | CUTANEOUS | Status: AC
  Administered 2022-10-21 – 2022-10-26 (×8): 1 via NASAL
  Filled 2022-10-21 (×5): qty 22

## 2022-10-21 MED ORDER — CHLORHEXIDINE GLUCONATE CLOTH 2 % EX PADS
6.0000 | MEDICATED_PAD | Freq: Every day | CUTANEOUS | Status: DC
  Administered 2022-10-21 – 2022-10-24 (×3): 6 via TOPICAL

## 2022-10-21 MED ORDER — CHLORHEXIDINE GLUCONATE CLOTH 2 % EX PADS
6.0000 | MEDICATED_PAD | Freq: Every day | CUTANEOUS | Status: DC
  Administered 2022-10-22 – 2022-10-24 (×3): 6 via TOPICAL

## 2022-10-21 MED ORDER — INSULIN ASPART 100 UNIT/ML IJ SOLN
0.0000 [IU] | Freq: Three times a day (TID) | INTRAMUSCULAR | Status: DC
  Administered 2022-10-23 – 2022-10-29 (×8): 1 [IU] via SUBCUTANEOUS
  Administered 2022-10-29: 2 [IU] via SUBCUTANEOUS
  Administered 2022-10-30: 1 [IU] via SUBCUTANEOUS
  Administered 2022-10-30: 2 [IU] via SUBCUTANEOUS

## 2022-10-21 MED ORDER — PREGABALIN 25 MG PO CAPS
25.0000 mg | ORAL_CAPSULE | Freq: Two times a day (BID) | ORAL | Status: DC
  Administered 2022-10-21 – 2022-10-26 (×10): 25 mg via ORAL
  Filled 2022-10-21 (×10): qty 1

## 2022-10-21 MED ORDER — OXYCODONE HCL 5 MG PO TABS: 5.0000 mg | ORAL_TABLET | Freq: Four times a day (QID) | ORAL | Status: DC

## 2022-10-21 MED ORDER — HYDROMORPHONE HCL 2 MG PO TABS
1.0000 mg | ORAL_TABLET | ORAL | Status: DC | PRN
  Administered 2022-10-24 – 2022-10-25 (×5): 1 mg via ORAL
  Filled 2022-10-21 (×5): qty 1

## 2022-10-21 MED ORDER — SODIUM CHLORIDE 0.9 % IV SOLN
INTRAVENOUS | Status: DC | PRN
  Administered 2022-10-28: 500 mL via INTRAVENOUS

## 2022-10-21 MED ORDER — HYDRALAZINE HCL 20 MG/ML IJ SOLN
10.0000 mg | Freq: Three times a day (TID) | INTRAMUSCULAR | Status: DC | PRN
  Administered 2022-10-21: 10 mg via INTRAVENOUS
  Filled 2022-10-21 (×2): qty 1

## 2022-10-21 MED ORDER — PANTOPRAZOLE SODIUM 40 MG IV SOLR
40.0000 mg | INTRAVENOUS | Status: DC
  Administered 2022-10-21 – 2022-10-23 (×3): 40 mg via INTRAVENOUS
  Filled 2022-10-21 (×3): qty 10

## 2022-10-21 MED ORDER — INSULIN DETEMIR 100 UNIT/ML ~~LOC~~ SOLN
40.0000 [IU] | Freq: Every day | SUBCUTANEOUS | Status: DC
  Administered 2022-10-21: 40 [IU] via SUBCUTANEOUS
  Filled 2022-10-21 (×3): qty 0.4

## 2022-10-21 MED ORDER — PROSOURCE PLUS PO LIQD
30.0000 mL | Freq: Two times a day (BID) | ORAL | Status: DC
  Administered 2022-10-22 – 2022-11-01 (×16): 30 mL via ORAL
  Filled 2022-10-21 (×19): qty 30

## 2022-10-21 MED ORDER — VANCOMYCIN HCL IN DEXTROSE 1-5 GM/200ML-% IV SOLN
1000.0000 mg | Freq: Once | INTRAVENOUS | Status: AC
  Administered 2022-10-21: 1000 mg via INTRAVENOUS
  Filled 2022-10-21 (×2): qty 200

## 2022-10-21 NOTE — H&P (Addendum)
History and Physical    Patient: [name] UEA:[id number] DOB: 03-Oct-1955 DOA: 10/20/2022 DOS: the patient was seen and examined on 10/21/2022 PCP: Heather Pacini, FNP  Patient coming from: Home  Chief Complaint:  Chief Complaint  Patient presents with   Altered Mental Status   Level 5 caveat for change in mental status History provided by ED provider, family and notes  HPI: [name] is a 67 y.o. female with medical history significant of  Necrotizing fasciitis, insulin-dependent diabetes mellitus, ESRD (TTS), hyperlipidemia, coronary artery disease s/p PCI, MI, hypertension, GERD, gout, hep C, depression who presents for altered mental status.   Patient lives in [place].  Per family, patient told them yesterday that she did not feel well.  [name] fell this morning and was found down for about 2 to 3 hours.  Family brought her to [place]. She has been in and out of it all day and speaks about being pregnant and having a baby.  They state that she does not use alcohol, use any illicit drugs.  Daughter reports the last time she was like this she had necrotizing fasciitis while admitted to [facility].   Upon arrival to the ED, patient altered and intermittently oriented to self. She was hypertensive but other vitals stable.  ED workup significant for: left foot first MTP head concerning for early osteomyelitis, pancolitis seen on CT abdomen pelvis. She does not have a leukocytosis, lactic acidosis, hyperammonemia, UDS, Tylenol and EtOH normal.  ED provider consulted hospitalist group for admission.   Review of Systems: unable to review all systems due to the inability of the patient to answer questions. Past Medical History:  Diagnosis Date   Anemia    Aortic stenosis    Echo 8/18: mean 13, peak 28, LVOT/AV mean velocity 0.51   Arthritis    Asthma    As a child    Bronchitis    CAD (coronary artery disease)    a. 09/2016: 50% Ost 1st Mrg stenosis, 50% 2nd Mrg stenosis,  20% Mid-Cx, 95% Prox LAD, 40% mid-LAD, and 10% dist-LAD stenosis. Staged PCI with DES to Prox-LAD.    Chronic combined systolic and diastolic CHF (congestive heart failure) (HCC) 2011   echo 2/18: EF 55-60, normal wall motion, grade 2 diastolic dysfunction, trivial AI // echo 3/18: Septal and apical HK, EF 45-50, normal wall motion, trivial AI, mild LAE, PASP 38 // echo 8/18: EF 60-65, normal wall motion, grade 1 diastolic dysfunction, calcified aortic valve leaflets, mild aortic stenosis (mean 13, peak 28, LVOT/AV mean velocity 0.51), mild AI, moderate MAC, mild LAE, trivial TR    Chronic kidney disease    STAGE 4   Chronic kidney disease on chronic dialysis (HCC)    t, th, sat   Complication of anesthesia    Depression    Diabetes mellitus Dx 1989   Elevated lipids    GERD (gastroesophageal reflux disease)    Gout    Heart murmur    asymptomatic   Hepatitis C Dx 2013   Hypertension Dx 1989   Infected surgical wound    Lt arm   Myocardial infarction (HCC) 07/2015   Obesity    Pancreatitis 2013   Pneumonia    Refusal of blood transfusions as patient is Jehov[employer]'s Witness    pt states she is not [name] witness and does not refuse blood products   Tendinitis    Tremors of nervous system    LEFT HAND   Ulcer 2010  Past Surgical History:  Procedure Laterality Date   A/V FISTULAGRAM Left 04/11/2019   Procedure: A/V FISTULAGRAM;  Surgeon: Renford Dills, MD;  Location: [facility]IVE CV [facility];  Service: Cardiovascular;  Laterality: Left;   A/V FISTULAGRAM Left 06/02/2019   Procedure: A/V FISTULAGRAM;  Surgeon: Renford Dills, MD;  Location: [facility];  Service: Cardiovascular;  Laterality: Left;   APPLICATION OF WOUND VAC Left 08/14/2017   Procedure: APPLICATION OF WOUND VAC Exchange;  Surgeon: Earline Mayotte, MD;  Location: [facility] ORS;  Service: General;  Laterality: Left;   APPLICATION OF WOUND VAC Left 12/21/2018   Procedure: APPLICATION OF WOUND VAC;  Surgeon:  Renford Dills, MD;  Location: [facility] OR[facility];  Service: Vascular;  Laterality: Left;   AV FISTULA PLACEMENT Left 08/19/2018   Procedure: ARTERIOVENOUS (AV) FISTULA CREATION ( BRACHIOBASILIC );  Surgeon: Renford Dills, MD;  Location: [facility];  Service: Vascular;  Laterality: Left;   BASCILIC VEIN TRANSPOSITION Left 11/18/2018   Procedure: BASCILIC VEIN TRANSPOSITION;  Surgeon: Renford Dills, MD;  Location: [facility];  Service: Vascular;  Laterality: Left;   BIOPSY  09/20/2020   Procedure: BIOPSY;  Surgeon: Jeani Hawking, MD;  Location[facility] ENDOSCOPY;  Service: Endoscopy;;   CHOLECYSTECTOMY     COLONOSCOPY WITH PROPOFOL N/A 02/03/2018   Procedure: COLONOSCOPY WITH PROPOFOL;  Surgeon: Toney Reil, MD;  Location: [facility] ENDOSCOPY;  Service: Gastroenterology;  Laterality: N/A;   COLONOSCOPY WITH PROPOFOL N/A 07/25/2022   Procedure: COLONOSCOPY WITH PROPOFOL;  Surgeon: Regis Bill, MD;  Location: [facility] ENDOSCOPY;  Service: Endoscopy;  Laterality: N/A;   CORONARY ANGIOPLASTY  07/2015   STENT   CORONARY STENT INTERVENTION N/A 09/18/2016   Procedure: Coronary Stent Intervention;  Surgeon: Lennette Bihari, MD;  Location: [facility];  Service: Cardiovascular;  Laterality: N/A;   DIALYSIS/PERMA CATHETER INSERTION N/A 05/10/2018   Procedure: DIALYSIS/PERMA CATHETER INSERTION;  Surgeon: Renford Dills, MD;  Location: [facility];  Service: Cardiovascular;  Laterality: N/A;   DRESSING CHANGE UNDER ANESTHESIA Left 08/15/2017   Procedure: exploration of wound for bleeding;  Surgeon: Earline Mayotte, MD;  Location: [facility];  Service: General;  Laterality: Left;   ESOPHAGOGASTRODUODENOSCOPY (EGD) WITH PROPOFOL N/A 02/03/2018   Procedure: ESOPHAGOGASTRODUODENOSCOPY (EGD) WITH PROPOFOL;  Surgeon: Toney Reil, MD;  Location: [facility] ENDOSCOPY;  Service: Gastroenterology;  Laterality: N/A;   ESOPHAGOGASTRODUODENOSCOPY (EGD) WITH PROPOFOL N/A 09/20/2020   Procedure:  ESOPHAGOGASTRODUODENOSCOPY (EGD) WITH PROPOFOL;  Surgeon: Jeani Hawking, MD;  Location: [facility] ENDOSCOPY;  Service: Endoscopy;  Laterality: N/A;   ESOPHAGOGASTRODUODENOSCOPY (EGD) WITH PROPOFOL N/A 07/25/2022   Procedure: ESOPHAGOGASTRODUODENOSCOPY (EGD) WITH PROPOFOL;  Surgeon: Regis Bill, MD;  Location: [facility] ENDOSCOPY;  Service: Endoscopy;  Laterality: N/A;   EYE SURGERY  11/17/2018   INCISION AND DRAINAGE ABSCESS Left 08/12/2017   Procedure: INCISION AND DRAINAGE ABSCESS;  Surgeon: Earline Mayotte, MD;  Location: [facility];  Service: General;  Laterality: Left;   KNEE ARTHROSCOPY     LEFT HEART CATH N/A 09/18/2016   Procedure: Left Heart Cath;  Surgeon: Lennette Bihari, MD;  Location: [facility];  Service: Cardiovascular;  Laterality: N/A;   LEFT HEART CATH AND CORONARY ANGIOGRAPHY N/A 09/16/2016   Procedure: Left Heart Cath and Coronary Angiography;  Surgeon: Kathleene Hazel, MD;  Location[facility]VAS[facility] CV LAB;  Service: Cardiovascular;  Laterality: N/A;   LEFT HEART CATH AND CORONARY ANGIOGRAPHY N/A 04/29/2017   Procedure: LEFT HEART CATH AND CORONARY  ANGIOGRAPHY;  Surgeon: Yvonne Kendall, MD;  Location: [facility];  Service: Cardiovascular;  Laterality: N/A;   LOWER EXTREMITY ANGIOGRAPHY Right 03/08/2018   Procedure: LOWER EXTREMITY ANGIOGRAPHY;  Surgeon: Renford Dills, MD;  Location: [facility];  Service: Cardiovascular;  Laterality: Right;   TUBAL LIGATION     TUBAL LIGATION     UPPER EXTREMITY ANGIOGRAPHY Right 09/19/2019   Procedure: UPPER EXTREMITY ANGIOGRAPHY;  Surgeon: Renford Dills, MD;  Location: [facility];  Service: Cardiovascular;  Laterality: Right;   WOUND DEBRIDEMENT Left 12/21/2018   Procedure: DEBRIDEMENT WOUND;  Surgeon: Renford Dills, MD;  Location: [facility];  Service: Vascular;  Laterality: Left;   WOUND DEBRIDEMENT Left 12/30/2018   Procedure: DEBRIDEMENT WOUND WITH VAC PLACEMENT (LEFT UPPER EXTREMITY);  Surgeon:  Renford Dills, MD;  Location: [facility] OR[facility];  Service: Vascular;  Laterality: Left;   Social History:  reports that she quit smoking about 42 years ago. Her smoking use included cigarettes. She has a 1.50 pack-year smoking history. She has never used smokeless tobacco. She reports that she does not currently use alcohol. She reports that she does not currently use drugs after having used the following drugs: Marijuana.  Allergies  Allergen Reactions   Morphine Itching    Tolerated hydromorphone on 07/2020 *will take along with Benadryl*   Shellfish Allergy Anaphylaxis and Swelling   Diazepam Other (See Comments)    "felt like out of body experience"    Family History  Problem Relation Age of Onset   Colon cancer Mother    Heart attack Other    Heart attack Maternal Grandmother    Hypertension Sister    Hypertension Brother    Diabetes Paternal Grandmother    Breast cancer Neg Hx     Prior to Admission medications   Medication Sig Start Date End Date Taking? Authorizing Provider  albuterol (VENTOLIN HFA) 108 (90 Base) MCG/ACT inhaler Inhale 2 puffs into the lungs every 4 (four) hours as needed for wheezing or shortness of breath. 06/28/20   Margaretann Loveless, PA-C  allopurinol (ZYLOPRIM) 100 MG tablet TAKE 1 TABLET BY MOUTH ONCE DAILY. 07/16/21   Malva Limes, MD  aspirin 81 MG EC tablet Take 1 tablet (81 mg total) by mouth daily. 09/20/20   Leroy Sea, MD  atorvastatin (LIPITOR) 80 MG tablet Take 1 tablet (80 mg total) by mouth every evening. 04/24/21   Erasmo Downer, MD  carvedilol (COREG) 12.5 MG tablet Take 1 tablet (12.5 mg total) by mouth 2 (two) times daily with a meal. 09/19/21   Paige, Lucas Mallow, DO  Continuous Blood Gluc Receiver (FREESTYLE LIBRE 14 DAY READER) DEVI To check blood sugar ACHS 09/27/20   Margaretann Loveless, PA-C  Continuous Blood Gluc Sensor (FREESTYLE LIBRE 14 DAY SENSOR) MISC To check blood sugar ACHS; change every 14 days 09/27/20    Margaretann Loveless, PA-C  fluticasone [facility]) 50 MCG/ACT nasal spray Place 2 sprays into both nostrils daily as needed for allergies or rhinitis. 09/28/17   Margaretann Loveless, PA-C  fluticasone furoate-vilanterol (BREO ELLIPTA) 200-25 MCG/INH AEPB Inhale 1 puff into the lungs daily. Patient not taking: Reported on 04/15/2022 08/30/20   Margaretann Loveless, PA-C  Glucose Blood (BLOOD GLUCOSE TEST STRIPS 333) STRP Enough test stripes to test blood sugar TID x 30 days. No refills 04/16/22   Charise Killian, MD  HUMULIN 70/30 KWIKPEN (70-30) 100 UNIT/ML KwikPen Inject 30 Units into the skin  2 (two) times daily. 05/26/22   [provider]  hydrOXYzine (ATARAX/VISTARIL) 25 MG tablet Take 1 tablet (25 mg total) by mouth 4 (four) times daily. Patient taking differently: Take 25 mg by mouth 3 (three) times daily as needed. 12/03/20   Bacigalupo, Marzella Schlein, MD  Insulin NPH, Human,, Isophane, (NOVOLIN N FLEXPEN) 100 UNIT/ML Kiwkpen Inject 100 Units into the skin every morning. 05/26/22 06/25/22  Gwyneth Sprout, MD  Insulin Pen Needle 32G X 4 MM MISC Enough pen needles for 100 units of novolin QAM x 30 days. No refills 04/16/22   Charise Killian, MD  LEVEMIR FLEXPEN 100 UNIT/ML FlexPen Inject 60 Units into the skin daily. Patient not taking: Reported on 07/23/2022    [provider]  methocarbamol (ROBAXIN) 500 MG tablet Take 1 tablet (500 mg total) by mouth 2 (two) times daily. Patient not taking: Reported on 07/23/2022 05/26/22   Gwyneth Sprout, MD  midodrine (PROAMATINE) 10 MG tablet Take 10 mg by mouth daily. 12/23/21   [provider]  naloxone [facility]) nasal spray 4 mg/0.1 mL Place 1 spray into the nose as directed. 04/08/22   [provider]  nitroGLYCERIN (NITROSTAT) 0.4 MG SL tablet Place 1 tablet (0.4 mg total) under the tongue every 5 (five) minutes x 3 doses as needed for chest pain. 05/03/20   Margaretann Loveless, PA-C  Oxycodone HCl 10 MG TABS Take  10 mg by mouth every 6 (six) hours. 04/01/22   [provider]  pregabalin (LYRICA) 50 MG capsule Take 50 mg by mouth 2 (two) times daily. 06/12/22   [provider]  valACYclovir (VALTREX) 500 MG tablet TAKE (1) TABLET BY MOUTH EVERY OTHER DAY. Patient taking differently: Take 500 mg by mouth every other day. 01/24/21   Bacigalupo, Marzella Schlein, MD  VELPHORO 500 MG chewable tablet Chew 1,500 mg by mouth 3 (three) times daily. 09/11/21   [provider]  gabapentin (NEURONTIN) 100 MG capsule Take 1 capsule (100 mg total) by mouth 3 (three) times daily. 08/19/17 02/03/19  Enedina Finner, MD    Physical Exam: Vitals:   10/20/22 2100 10/20/22 2115 10/20/22 2135 10/20/22 2144  BP: (!) 179/94 (!) 189/71  (!) 183/64  Pulse:    88  Resp: 13 17  14   Temp:    99.6 F (37.6 C)  TempSrc:    Oral  SpO2:    95%  Weight:   115.5 kg   Height:   5\' 5"  (1.651 m)    GEN:     alert, lethargic and no distress    HENT:  mucus membranes moist, oropharyngeal without lesions or erythema,  nares patent, no nasal discharge  EYES:   pupils equal and reactive, EOM intact NECK:  supple, no lymphadenopathy  RESP:  clear to auscultation bilaterally, no increased work of breathing  CVS:   regular rate and rhythm, distal pulses intact, LUE fistula   ABD:  soft, generalized tenderness; distended, bowel sounds present; no palpable masses EXT:   normal ROM, s/p right great toe amputation with purulent fluid from wound,  NEURO:  awakes with manipulation but quickly doses back off, jerky movements, speech fluent while speaking, does not answer orientation questions Skin:   warm and dry, see photo below and description in MSK     Data Reviewed: Relevant notes from primary care and specialist visits, past discharge summaries as available in EHR, including Care Everywhere. Prior diagnostic testing as pertinent to current admission diagnoses Updated medications and problem  lists for reconciliation ED  course, including vitals, labs, imaging, treatment and response to treatment Triage notes, nursing and pharmacy notes and ED provider's notes  CMP: Sodium 134, chloride 93, glucose 219, BUN 65, creatinine 12.18, calcium 8.6, ALP 273, GFR 3, anion gap 18 CBC: WBC 9.3, hemoglobin 12.7, platelets 173 VBG: pH 7.417, pCO2 37, pO2 61, bicarb 23.8 Ammonia, 17 Lactic acid: 1.5 Ethanol: Negative Tylenol: Negative UDS: Negative UA: Glucosuria, hematuria not supported on microscopy, proteinuria,  no evidence of acute cystitis  Left foot: First metatarsal head early onset osteomyelitis Chest x-[place]: Bibasilar chest densities appear to be chronic, no acute abnormalities CT head and neck: No acute abnormalities CT abdomen pelvis: Pancolitis, questionable cirrhosis, bilateral renal cysts EKG: Normal sinus rhythm without acute ST or T wave changes, prolonged QTc   Blood cultures: Pending Urine culture:: Pending  Assessment and Plan: Principal Problem:   Acute encephalopathy Active Problems:   COPD (chronic obstructive pulmonary disease)   Essential hypertension   Type 2 diabetes mellitus with chronic kidney disease, with long-term current use of insulin   Depression   CAD (coronary artery disease)   Hyperlipidemia   Chronic diastolic heart failure   ESRD (end stage renal disease)   Prolonged QT interval   Pancolitis   Osteomyelitis of left foot  Acute encephalopathy ED workup including ethanol, UDS, lactic acid, ammonia were all unremarkable.  She is uremic with her last dialysis day being on Thursday. EKG without acute ST or T wave changes. Obtain troponin.  Presuming infectious etiology given concern for osteomyelitis and pancolitis despite not having lactic acidosis or leukocytosis.  - Admit to med-tele  - Continue vancomycin and Zosyn  Concern for Osteomyelitis of left metatarsal -Continue Vanco and Zosyn -MR left foot   Pancolitis Pt with generalized abdominal tenderness.  CT  abdomen pelvis showing diffuse colonic wall thickening with surrounding inflammation most prominent in the ascending and transverse colon.   - Monitor abdominal exam  - Vanc and Zosyn for now  ESRD -Nephro consulted by ED provider; Dr. Allena Katz aware -HD per nephro  Hypertension -As needed hydralazine IV  Type 2 diabetes - SSI - Reduce to 40 units Levemir daily -Trend CBGs   Coronary artery disease  Hx of MI with PCI Home medications include ASA, Coreg, atorvastatin.  EKG without acute ST or T wave changes.  Obtain troponin. -Hold home medications while altered   Hyperlipidemia -Hold Lipitor while n.p.o.  Chronic diastolic heart failure -Daily weights -Strict I's and O's -HD for volume management  Prolonged QT interval - Mg > 2 with K>4    Advance Care Planning:   Code Status: Full Code   Consults: Nephrology  Family Communication: Son and daughter updated at bedside  Severity of Illness: The appropriate patient status for this patient is INPATIENT. Inpatient status is judged to be reasonable and necessary in order to provide the required intensity of service to ensure the patient's safety. The patient's presenting symptoms, physical exam findings, and initial radiographic and laboratory data in the context of their chronic comorbidities is felt to place them at high risk for further clinical deterioration. Furthermore, it is not anticipated that the patient will be medically stable for discharge from the hospital within 2 midnights of admission.   * I certify that at the point of admission it is my clinical judgment that the patient will require inpatient hospital care spanning beyond 2 midnights from the point of admission due to high intensity of service, high risk for  further deterioration and high frequency of surveillance required.*  Author: Katha CabalVondra Amadi Yoshino, DO 10/21/2022 1:15 AM  For on call review [email or web address].

## 2022-10-21 NOTE — Progress Notes (Signed)
PROGRESS NOTE   [name]  ONG:[id number] DOB: 1955-12-31 DOA: 10/20/2022 PCP: Felix Pacini, FNP  Brief Narrative:  67 year old black female known ESRD HD TTS DM TY 2 HFpEF EF 60-65% 09/11/2021-CAD 09/2016 NSTEMI Hep C unclear if treated Gout History of colostomy status post takedown 11/2021 for necrotizing fasciitis of abdomen--EGD colonoscopy 07/2022 unremarkable Chronic right hip pain-initial concerns of avascular necrosis have not been confirmed in the past  Patient had great toe amputation with Dr. Kandis Cocking and grew out Pseudomonas Prevotella and coagulase-negative staph and was told to complete 2 weeks of Cipro and Flagyl stop date 3/15  09/29/2022 visited orthopedics Dr. Emmit [facility] recently with [name] for bilateral osteoarthritis of knees and was recommended pain management referral versus lubrication injection  Admitted 10/20/2022 altered mental status Sodium 134 BUNs/creatinine 61/13 (this is baseline) Alk phos 233 albumin 3 AST/ALT normal WBC 9.2 platelets 173--- ammonia was 17  CT abdomen chest pelvis = pancolitis nodular liver bilateral renal cysts hypodensities (does not recommend any follow-up imaging) CT head and CT cervical spine negative for any abnormality Foot x-Wile interval to great toe amputation with overlying soft tissue wound questionable subtle margin erosion of first metatarsal head?  Early osteomyelitis minimally displaced intra-articular fracture base of second proximal toe  Started on Vanco and Zosyn, MRI foot pending  Hospital-Problem based course  Osteomyelitis first metatarsal head Displaced intra-articular second proximal toe fracture CRP 1.1 ESR 26 MRI pending Await Dr. Lajoyce Corners input as may require further debridement/amputation Will hold aspirin until patient is seen May have renal heart healthy diet  Metabolic encephalopathy likely infectious CT head is negative Resume Lyrica at 25 twice daily--she is on oxycodone 10  every 6 and I will replace that with low-dose 5 every 6 to prevent withdrawal  ESRD [facility] TTS via LUE AVF Missed several sessions 4/6 and 10/20/2022 Defer to renal As is hypertensive hold midodrine 10 daily  Prior necrotizing fasciitis of the abdomen with colostomy takedown Stable I am not sure that she has a pancolitis-ROS is pan positive-will continue broad-spectrum antibiotics for now  DM TY 2 C ABGs low 100sla He is on very sensitive sliding scale and 40 units of Levemir (usual dose unclear, MAR not accurate)  DVT prophylaxis: Heparin Code Status: Full Family Communication: Discussed with sister on phone Disposition:  Status is: Inpatient Remains inpatient appropriate because:   Remains altered and likely needs amputation leg versus transmet amputation      Subjective: Confused Cannot tell me where she is-speaking slowly thinks that she had MRI when she did not Remembers that she went to dialysis [place] Confabulating at times " You look like a TV personality"  Objective: Vitals:   10/20/22 2135 10/20/22 2144 10/21/22 0235 10/21/22 0247  BP:  (!) 183/64 (!) 143/55   Pulse:  88 88   Resp:  14 15   Temp:  99.6 F (37.6 C) 99.5 F (37.5 C)   TempSrc:  Oral Oral   SpO2:  95% 97%   Weight: 115.5 kg   115.5 kg  Height: 5\' 5"  (1.651 m)       Intake/Output Summary (Last 24 hours) at 10/21/2022 0724 Last data filed at 10/21/2022 0200 Gross per 24 hour  Intake 50 ml  Output --  Net 50 ml   Filed Weights   10/20/22 1407 10/20/22 2135 10/21/22 0247  Weight: 104.3 kg 115.5 kg 115.5 kg    Examination:  EOMI thick neck Mallampati 4 no icterus no pallor  neck soft supple S1-S2 no murmur tachycardic slightly Abdomen obese nondistended difficult exam she voluntarily guards--do not appreciate any organomegaly Left lower extremity seems swollen I did not examine wound today I did not examine back Chest is clear   Data Reviewed: personally reviewed   CBC     Component Value Date/Time   WBC 8.4 10/21/2022 0329   RBC 4.33 10/21/2022 0329   HGB 12.0 10/21/2022 0329   HGB 9.7 (L) 09/27/2020 1417   HCT 38.4 10/21/2022 0329   HCT 28.9 (L) 09/27/2020 1417   PLT 155 10/21/2022 0329   PLT 209 09/27/2020 1417   MCV 88.7 10/21/2022 0329   MCV 89 09/27/2020 1417   MCH 27.7 10/21/2022 0329   MCHC 31.3 10/21/2022 0329   RDW 17.7 (H) 10/21/2022 0329   RDW 14.5 09/27/2020 1417   LYMPHSABS 1.5 05/26/2022 1100   LYMPHSABS 1.5 09/27/2020 1417   MONOABS 0.6 05/26/2022 1100   EOSABS 0.3 05/26/2022 1100   EOSABS 0.2 09/27/2020 1417   BASOSABS 0.1 05/26/2022 1100   BASOSABS 0.0 09/27/2020 1417      Latest Ref Rng & Units 10/21/2022    3:29 AM 10/20/2022    3:45 PM 10/20/2022    3:44 PM  CMP  Glucose 70 - 99 mg/dL 825  003    BUN 8 - 23 mg/dL 66  61    Creatinine 7.04 - 1.00 mg/dL 88.89  16.94    Sodium 135 - 145 mmol/L 136  134  134   Potassium 3.5 - 5.1 mmol/L 4.9  4.7  4.7   Chloride 98 - 111 mmol/L 98  101    CO2 22 - 32 mmol/L 24     Calcium 8.9 - 10.3 mg/dL 8.2     Total Protein 6.5 - 8.1 g/dL 6.3     Total Bilirubin 0.3 - 1.2 mg/dL 0.6     Alkaline Phos 38 - 126 U/L 233     AST 15 - 41 U/L 10     ALT 0 - 44 U/L 15        Radiology Studies: CT ABDOMEN PELVIS W CONTRAST  Result Date: 10/20/2022 CLINICAL DATA:  Acute abdominal pain EXAM: CT ABDOMEN AND PELVIS WITH CONTRAST TECHNIQUE: Multidetector CT imaging of the abdomen and pelvis was performed using the standard protocol following bolus administration of intravenous contrast. RADIATION DOSE REDUCTION: This exam was performed according to the departmental dose-optimization program which includes automated exposure control, adjustment of the mA and/or kV according to patient size and/or use of iterative reconstruction technique. CONTRAST:  71mL OMNIPAQUE IOHEXOL 350 MG/ML SOLN COMPARISON:  CT abdomen and pelvis 05/26/2022 FINDINGS: Lower chest: There is atelectasis in the lung bases.  Hepatobiliary: There is questionable nodular liver contour. Gallbladder surgically absent. No biliary ductal dilatation. Pancreas: Unremarkable. No pancreatic ductal dilatation or surrounding inflammatory changes. Spleen: Normal in size without focal abnormality. Adrenals/Urinary Tract: There are numerous bilateral renal cortical cysts and hypodensities which are too small to characterize measuring up to 12 mm. There is no hydronephrosis or perinephric fat stranding. The adrenal glands and bladder are within normal limits. Stomach/Bowel: There is diffuse colonic wall thickening with surrounding inflammation more prominent in the ascending and transverse colon. There is no pneumatosis, bowel obstruction or free air. The appendix is not visualized small bowel and stomach are within normal limits. Stomach is within normal limits. Vascular/Lymphatic: Aortic atherosclerosis. No enlarged abdominal or pelvic lymph nodes. Reproductive: Uterus and bilateral adnexa are unremarkable. Other: Small fat containing  umbilical hernia.  No ascites. Musculoskeletal: No acute fractures are seen. IMPRESSION: 1. Pancolitis. 2. Questionable nodular liver contour. Correlate clinically for cirrhosis. 3. Bilateral renal cysts and hypodensities which are too small to characterize. No follow-up imaging necessary. Aortic Atherosclerosis (ICD10-I70.0). Electronically Signed   By: Darliss CheneyAmy  Guttmann M.D.   On: 10/20/2022 17:29   CT Head Wo Contrast  Result Date: 10/20/2022 CLINICAL DATA:  Mental status change of unknown cause. Patient found on the floor. EXAM: CT HEAD WITHOUT CONTRAST CT CERVICAL SPINE WITHOUT CONTRAST TECHNIQUE: Multidetector CT imaging of the head and cervical spine was performed following the standard protocol without intravenous contrast. Multiplanar CT image reconstructions of the cervical spine were also generated. RADIATION DOSE REDUCTION: This exam was performed according to the departmental dose-optimization program which  includes automated exposure control, adjustment of the mA and/or kV according to patient size and/or use of iterative reconstruction technique. COMPARISON:  05/26/2022. FINDINGS: CT HEAD FINDINGS Brain: No evidence of acute infarction, hemorrhage, hydrocephalus, extra-axial collection or mass lesion/mass effect. Vascular: No hyperdense vessel or unexpected calcification. Skull: Normal. Negative for fracture or focal lesion. Sinuses/Orbits: Globes and orbits are unremarkable. Mild mucosal thickening along the floor the right maxillary sinus. Sinuses otherwise clear. Other: None. CT CERVICAL SPINE FINDINGS Alignment: Slight reversal the normal cervical lordosis, apex at C5. No spondylolisthesis. Skull base and vertebrae: No acute fracture. No primary bone lesion or focal pathologic process. Soft tissues and spinal canal: No prevertebral fluid or swelling. No visible canal hematoma. Disc levels: Mild loss of disc height at C2-C3. Moderate loss of disc height at C5-C6 and C6-C7. Mild disc bulging. No convincing disc herniation. No significant stenosis. Upper chest: Upper lung ground-glass opacities suspected to be due to air trapping and small airways disease, but are nonspecific. Other: None. IMPRESSION: HEAD CT 1. No acute intracranial abnormalities CERVICAL CT 1. No fracture or acute skeletal abnormality Electronically Signed   By: Amie Portlandavid  Ormond M.D.   On: 10/20/2022 17:29   CT Cervical Spine Wo Contrast  Result Date: 10/20/2022 CLINICAL DATA:  Mental status change of unknown cause. Patient found on the floor. EXAM: CT HEAD WITHOUT CONTRAST CT CERVICAL SPINE WITHOUT CONTRAST TECHNIQUE: Multidetector CT imaging of the head and cervical spine was performed following the standard protocol without intravenous contrast. Multiplanar CT image reconstructions of the cervical spine were also generated. RADIATION DOSE REDUCTION: This exam was performed according to the departmental dose-optimization program which includes  automated exposure control, adjustment of the mA and/or kV according to patient size and/or use of iterative reconstruction technique. COMPARISON:  05/26/2022. FINDINGS: CT HEAD FINDINGS Brain: No evidence of acute infarction, hemorrhage, hydrocephalus, extra-axial collection or mass lesion/mass effect. Vascular: No hyperdense vessel or unexpected calcification. Skull: Normal. Negative for fracture or focal lesion. Sinuses/Orbits: Globes and orbits are unremarkable. Mild mucosal thickening along the floor the right maxillary sinus. Sinuses otherwise clear. Other: None. CT CERVICAL SPINE FINDINGS Alignment: Slight reversal the normal cervical lordosis, apex at C5. No spondylolisthesis. Skull base and vertebrae: No acute fracture. No primary bone lesion or focal pathologic process. Soft tissues and spinal canal: No prevertebral fluid or swelling. No visible canal hematoma. Disc levels: Mild loss of disc height at C2-C3. Moderate loss of disc height at C5-C6 and C6-C7. Mild disc bulging. No convincing disc herniation. No significant stenosis. Upper chest: Upper lung ground-glass opacities suspected to be due to air trapping and small airways disease, but are nonspecific. Other: None. IMPRESSION: HEAD CT 1. No acute intracranial abnormalities CERVICAL CT  1. No fracture or acute skeletal abnormality Electronically Signed   By: Amie Portland M.D.   On: 10/20/2022 17:29   DG Foot Complete Left  Result Date: 10/20/2022 CLINICAL DATA:  Foot infection. EXAM: LEFT FOOT - COMPLETE 3+ VIEW COMPARISON:  Left foot radiographs 07/22/2022 FINDINGS: Sequelae of interval great toe amputation are identified at the level of the MTP joint. There is gas/a wound within the overlying soft tissues at the amputation site. Subtle marginal erosions are questioned involving the first metatarsal head. There is a minimally displaced intra-articular fracture of the base of the proximal phalanx of the second toe which is new from the prior study  but of indeterminate acuity. Diffuse soft tissue swelling is noted in the forefoot. There are atherosclerotic vascular calcifications. IMPRESSION: 1. Interval great toe amputation with overlying soft tissue wound. Questionable subtle marginal erosion at the first metatarsal head for which early osteomyelitis cannot be excluded. 2. Minimally displaced intra-articular fracture of the base of the second toe proximal phalanx, of indeterminate acuity. Electronically Signed   By: [name] M.D.   On: 10/20/2022 16:18   DG Chest 2 View  Result Date: 10/20/2022 CLINICAL DATA:  Altered mental status.  Left foot infection. EXAM: CHEST - 2 VIEW COMPARISON:  07/22/2022 FINDINGS: Mild elevation of the right hemidiaphragm is unchanged. Chronic linear densities at both lung bases are suggestive for areas of scarring. No new airspace disease or lung consolidation. Trachea is midline. Heart and mediastinum are within normal limits. Atherosclerotic calcifications at the aortic arch. No large pleural effusions. IMPRESSION: Bibasilar chest densities appear to be chronic and most compatible with areas of scarring. No acute chest findings. Electronically Signed   By: Richarda Overlie M.D.   On: 10/20/2022 16:11     Scheduled Meds:  (feeding supplement) PROSource Plus  30 mL Oral BID BM   Chlorhexidine Gluconate Cloth  6 each Topical Q0600   heparin  5,000 Units Subcutaneous Q8H    HYDROmorphone (DILAUDID) injection  0.5 mg Intravenous Once   insulin aspart  0-6 Units Subcutaneous TID WC   insulin detemir  40 Units Subcutaneous Daily   pantoprazole (PROTONIX) IV  40 mg Intravenous Q24H   pregabalin  25 mg Oral BID   Continuous Infusions:  piperacillin-tazobactam (ZOSYN)  IV 2.25 g (10/21/22 0313)   vancomycin       LOS: 1 day   Time spent: 72  Rhetta Mura, MD [facility] To contact the attending provider between 7A-7P or the covering provider during after hours 7P-7A, please log into the web site  [email or web address] and access using universal [place] password for that web site. If you do not have the password, please call the hospital operator.  10/21/2022, 7:24 AM

## 2022-10-21 NOTE — Progress Notes (Signed)
Pt currently receiving out-pt HD at [facility]) as a transient on TTS with 12:00 arrival time for 12:15 chair time. Pt's home HD clinic is [facility]. Pt can return to [facility] at d/c if pt to remain in the [place] area at d/c. Will assist as needed.   Olivia Canter Renal Navigator [phone]

## 2022-10-21 NOTE — Consult Note (Addendum)
[facility] Renal Consultation Note    Indication for Consultation:  Management of ESRD/hemodialysis, anemia, hypertension/volume, and secondary hyperparathyroidism. PCP:  HPI: [name] is a 67 y.o. female with ESRD, CAD, HFrEF, T2DM who was admitted with AMS.  Per notes - was found on floor in her home last night and she was very confused at the time and brought to ED. Vitals were ok - she was not hypoxic. Labs with Na 136, K 4.9, BUN 66, Cr 12.6, Trop 25, LA 1.5, WBC 8.4, Hgb 12, Plts 155. Tox screen negative. Head and C-spine CT negative. She had c/o abdominal pain. Abd CT with ?pancolitis. On skin exam, she had recent L great toe amputation (09/08/22) where the incision had dehisced (this was known, was getting wound care (last OV 09/29/22) and had recently finished abx course - Cipro/Flagyl 3/6-3/15/24), and there was purulent drainage, and there were also some purple skin lesions to dorsal foot. Foot x-Deyarmin showed possible early osteo. Cultured and started on broad-spectrum abx.  Nephrology was consulted overnight - dialysis orders entered. I saw her while on HD this AM - approximately 2hr into her treatment. It was difficult to wake her, but once awake she appeared to be oriented and was able to speak semi-coherently. Her reaction time seems a bit slow. She did not recall the events that brought her to the hospital. She does endorse abdominal pain but also reports that she is very hungry. Denies CP or dyspnea.  Dialyzes at [facility] on TTS schedule - her last treatment was actually 10/15/22 - last Thursday. She missed HD on 4/6 and 4/9. Her LUE AVF is being used without issues today.   Past Medical History:  Diagnosis Date   Anemia    Aortic stenosis    Echo 8/18: mean 13, peak 28, LVOT/AV mean velocity 0.51   Arthritis    Asthma    As a child    Bronchitis    CAD (coronary artery disease)    a. 09/2016: 50% Ost 1st Mrg stenosis, 50% 2nd Mrg stenosis, 20% Mid-Cx, 95% Prox  LAD, 40% mid-LAD, and 10% dist-LAD stenosis. Staged PCI with DES to Prox-LAD.    Chronic combined systolic and diastolic CHF (congestive heart failure) 2011   echo 2/18: EF 55-60, normal wall motion, grade 2 diastolic dysfunction, trivial AI // echo 3/18: Septal and apical HK, EF 45-50, normal wall motion, trivial AI, mild LAE, PASP 38 // echo 8/18: EF 60-65, normal wall motion, grade 1 diastolic dysfunction, calcified aortic valve leaflets, mild aortic stenosis (mean 13, peak 28, LVOT/AV mean velocity 0.51), mild AI, moderate MAC, mild LAE, trivial TR    Chronic kidney disease    STAGE 4   Chronic kidney disease on chronic dialysis    t, th, sat   Complication of anesthesia    Depression    Diabetes mellitus Dx 1989   Elevated lipids    GERD (gastroesophageal reflux disease)    Gout    Heart murmur    asymptomatic   Hepatitis C Dx 2013   Hypertension Dx 1989   Infected surgical wound    Lt arm   Myocardial infarction 07/2015   Obesity    Pancreatitis 2013   Pneumonia    Refusal of blood transfusions as patient is Jehovah's Witness    pt states she is not [place] witness and does not refuse blood products   Tendinitis    Tremors of nervous system    LEFT HAND  Ulcer 2010   Past Surgical History:  Procedure Laterality Date   A/V FISTULAGRAM Left 04/11/2019   Procedure: A/V FISTULAGRAM;  Surgeon: Renford DillsSchnier, Gregory G, MD;  Location: [facility] INVASIVE CV LAB;  Service: Cardiovascular;  Laterality: Left;   A/V FISTULAGRAM Left 06/02/2019   Procedure: A/V FISTULAGRAM;  Surgeon: Renford DillsSchnier, Gregory G, MD;  Location: [facility] INVASIVE CV LAB;  Service: Cardiovascular;  Laterality: Left;   APPLICATION OF WOUND VAC Left 08/14/2017   Procedure: APPLICATION OF WOUND VAC Exchange;  Surgeon: Earline MayotteByrnett, Jeffrey W, MD;  Location: [facility] ORS;  Service: General;  Laterality: Left;   APPLICATION OF WOUND VAC Left 12/21/2018   Procedure: APPLICATION OF WOUND VAC;  Surgeon: Renford DillsSchnier, Gregory G, MD;  Location: [facility]  ORS;  Service: Vascular;  Laterality: Left;   AV FISTULA PLACEMENT Left 08/19/2018   Procedure: ARTERIOVENOUS (AV) FISTULA CREATION ( BRACHIOBASILIC );  Surgeon: Renford DillsSchnier, Gregory G, MD;  Location: [facility] ORS;  Service: Vascular;  Laterality: Left;   BASCILIC VEIN TRANSPOSITION Left 11/18/2018   Procedure: BASCILIC VEIN TRANSPOSITION;  Surgeon: Renford DillsSchnier, Gregory G, MD;  Location: [facility] ORS;  Service: Vascular;  Laterality: Left;   BIOPSY  09/20/2020   Procedure: BIOPSY;  Surgeon: Jeani HawkingHung, Patrick, MD;  [facility];  Service: Endoscopy;;   CHOLECYSTECTOMY     COLONOSCOPY WITH PROPOFOL N/A 02/03/2018   Procedure: COLONOSCOPY WITH PROPOFOL;  Surgeon: Toney ReilVanga, Rohini Reddy, MD;  [facility];  Service: Gastroenterology;  Laterality: N/A;   COLONOSCOPY WITH PROPOFOL N/A 07/25/2022   Procedure: COLONOSCOPY WITH PROPOFOL;  Surgeon: Regis BillLocklear, Cameron T, MD;  Location: [facility] ENDOSCOPY;  Service: Endoscopy;  Laterality: N/A;   CORONARY ANGIOPLASTY  07/2015   STENT   CORONARY STENT INTERVENTION N/A 09/18/2016   Procedure: Coronary Stent Intervention;  Surgeon: Lennette Biharihomas A Kelly, MD;  [facility] LAB;  Service: Cardiovascular;  Laterality: N/A;   DIALYSIS/PERMA CATHETER INSERTION N/A 05/10/2018   Procedure: DIALYSIS/PERMA CATHETER INSERTION;  Surgeon: Renford DillsSchnier, Gregory G, MD;  Location: [facility] INVASIVE CV LAB;  Service: Cardiovascular;  Laterality: N/A;   DRESSING CHANGE UNDER ANESTHESIA Left 08/15/2017   Procedure: exploration of wound for bleeding;  Surgeon: Earline MayotteByrnett, Jeffrey W, MD;  Location: [facility] ORS;  Service: General;  Laterality: Left;   ESOPHAGOGASTRODUODENOSCOPY (EGD) WITH PROPOFOL N/A 02/03/2018   Procedure: ESOPHAGOGASTRODUODENOSCOPY (EGD) WITH PROPOFOL;  Surgeon: Toney ReilVanga, Rohini Reddy, MD;  Location: [facility] ENDOSCOPY;  Service: Gastroenterology;  Laterality: N/A;   ESOPHAGOGASTRODUODENOSCOPY (EGD) WITH PROPOFOL N/A 09/20/2020   Procedure: ESOPHAGOGASTRODUODENOSCOPY (EGD) WITH PROPOFOL;   Surgeon: Jeani HawkingHung, Patrick, MD;  [facility];  Service: Endoscopy;  Laterality: N/A;   ESOPHAGOGASTRODUODENOSCOPY (EGD) WITH PROPOFOL N/A 07/25/2022   Procedure: ESOPHAGOGASTRODUODENOSCOPY (EGD) WITH PROPOFOL;  Surgeon: Regis BillLocklear, Cameron T, MD;  Location: [facility] ENDOSCOPY;  Service: Endoscopy;  Laterality: N/A;   EYE SURGERY  11/17/2018   INCISION AND DRAINAGE ABSCESS Left 08/12/2017   Procedure: INCISION AND DRAINAGE ABSCESS;  Surgeon: Earline MayotteByrnett, Jeffrey W, MD;  Location: [facility] ORS;  Service: General;  Laterality: Left;   KNEE ARTHROSCOPY     LEFT HEART CATH N/A 09/18/2016   Procedure: Left Heart Cath;  Surgeon: Lennette Biharihomas A Kelly, MD;  [facility];  Service: Cardiovascular;  Laterality: N/A;   LEFT HEART CATH AND CORONARY ANGIOGRAPHY N/A 09/16/2016   Procedure: Left Heart Cath and Coronary Angiography;  Surgeon: Kathleene Hazelhristopher D McAlhany, MD;  [facility];  Service: Cardiovascular;  Laterality: N/A;   LEFT HEART CATH AND CORONARY ANGIOGRAPHY N/A 04/29/2017   Procedure: LEFT  HEART CATH AND CORONARY ANGIOGRAPHY;  Surgeon: Yvonne Kendall, MD;  [facility]: [facility];  Service: Cardiovascular;  Laterality: N/A;   LOWER EXTREMITY ANGIOGRAPHY Right 03/08/2018   Procedure: LOWER EXTREMITY ANGIOGRAPHY;  Surgeon: Renford Dills, MD;  Location: [facility] CV [facility];  Service: Cardiovascular;  Laterality: Right;   TUBAL LIGATION     TUBAL LIGATION     UPPER EXTREMITY ANGIOGRAPHY Right 09/19/2019   Procedure: UPPER EXTREMITY ANGIOGRAPHY;  Surgeon: Renford Dills, MD;  Location: [facility];  Service: Cardiovascular;  Laterality: Right;   WOUND DEBRIDEMENT Left 12/21/2018   Procedure: DEBRIDEMENT WOUND;  Surgeon: Renford Dills, MD;  Location: [facility] ORS;  Service: Vascular;  Laterality: Left;   WOUND DEBRIDEMENT Left 12/30/2018   Procedure: DEBRIDEMENT WOUND WITH VAC PLACEMENT (LEFT UPPER EXTREMITY);  Surgeon: Renford Dills, MD;  Location: [facility] ORS;   Service: Vascular;  Laterality: Left;   Family History  Problem Relation Age of Onset   Colon cancer Mother    Heart attack Other    Heart attack Maternal Grandmother    Hypertension Sister    Hypertension Brother    Diabetes Paternal Grandmother    Breast cancer Neg Hx    Social History:  reports that she quit smoking about 42 years ago. Her smoking use included cigarettes. She has a 1.50 pack-year smoking history. She has never used smokeless tobacco. She reports that she does not currently use alcohol. She reports that she does not currently use drugs after having used the following drugs: Marijuana.  ROS: As per HPI otherwise negative.  Physical Exam: Vitals:   10/21/22 1025 10/21/22 1030 10/21/22 1035 10/21/22 1100  BP:  (!) 174/77  (!) 179/68  Pulse: 85 86 86 83  Resp: 11 12 10 12   Temp:      TempSrc:      SpO2: 100% 98% 99% 100%  Weight:      Height:         General: Obese woman, NAD. Room air. Hard to waken, but oriented once awake, slow reaction time. Head: Normocephalic, atraumatic, sclera non-icteric. Neck: Supple without lymphadenopathy/masses. JVD not elevated. Lungs: Clear bilaterally to auscultation without wheezes, rales, or rhonchi. Breathing is unlabored. Heart: RRR with normal S1, S2. No murmurs, rubs, or gallops appreciated. Abdomen: Soft, but tender to palpation across all quadrants Lower extremities: No RLE edema, L foot with bandaged toe - purplish area on dorsal foot Neuro: Alert and oriented X 3. Moves all extremities spontaneously. Psych:  Responds to questions appropriately with a normal affect. Dialysis Access: LUE AVF + bruit (cannulated)  Allergies  Allergen Reactions   Morphine Itching    Tolerated hydromorphone on 07/2020 *will take along with Benadryl*   Shellfish Allergy Anaphylaxis and Swelling   Diazepam Other (See Comments)    "felt like out of body experience"   Prior to Admission medications   Medication Sig Start Date End Date  Taking? Authorizing Provider  albuterol (VENTOLIN HFA) 108 (90 Base) MCG/ACT inhaler Inhale 2 puffs into the lungs every 4 (four) hours as needed for wheezing or shortness of breath. 06/28/20   Margaretann Loveless, PA-C  allopurinol (ZYLOPRIM) 100 MG tablet TAKE 1 TABLET BY MOUTH ONCE DAILY. 07/16/21   Malva Limes, MD  aspirin 81 MG EC tablet Take 1 tablet (81 mg total) by mouth daily. 09/20/20   Leroy Sea, MD  atorvastatin (LIPITOR) 80 MG tablet Take 1 tablet (80 mg total) by mouth every evening. 04/24/21  Erasmo Downer, MD  carvedilol (COREG) 12.5 MG tablet Take 1 tablet (12.5 mg total) by mouth 2 (two) times daily with a meal. 09/19/21   Paige, Lucas Mallow, DO  Continuous Blood Gluc Receiver (FREESTYLE LIBRE 14 DAY READER) DEVI To check blood sugar ACHS 09/27/20   Margaretann Loveless, PA-C  Continuous Blood Gluc Sensor (FREESTYLE LIBRE 14 DAY SENSOR) MISC To check blood sugar ACHS; change every 14 days 09/27/20   Margaretann Loveless, PA-C  fluticasone [facility]) 50 MCG/ACT nasal spray Place 2 sprays into both nostrils daily as needed for allergies or rhinitis. 09/28/17   Margaretann Loveless, PA-C  fluticasone furoate-vilanterol (BREO ELLIPTA) 200-25 MCG/INH AEPB Inhale 1 puff into the lungs daily. Patient not taking: Reported on 04/15/2022 08/30/20   Margaretann Loveless, PA-C  Glucose Blood (BLOOD GLUCOSE TEST STRIPS 333) STRP Enough test stripes to test blood sugar TID x 30 days. No refills 04/16/22   Charise Killian, MD  HUMULIN 70/30 KWIKPEN (70-30) 100 UNIT/ML KwikPen Inject 30 Units into the skin 2 (two) times daily. 05/26/22   [provider]  hydrOXYzine (ATARAX/VISTARIL) 25 MG tablet Take 1 tablet (25 mg total) by mouth 4 (four) times daily. Patient taking differently: Take 25 mg by mouth 3 (three) times daily as needed. 12/03/20   Bacigalupo, Marzella Schlein, MD  Insulin NPH, Human,, Isophane, (NOVOLIN N FLEXPEN) 100 UNIT/ML Kiwkpen Inject 100 Units into the skin every  morning. 05/26/22 06/25/22  Gwyneth Sprout, MD  Insulin Pen Needle 32G X 4 MM MISC Enough pen needles for 100 units of novolin QAM x 30 days. No refills 04/16/22   Charise Killian, MD  LEVEMIR FLEXPEN 100 UNIT/ML FlexPen Inject 60 Units into the skin daily. Patient not taking: Reported on 07/23/2022    [provider]  methocarbamol (ROBAXIN) 500 MG tablet Take 1 tablet (500 mg total) by mouth 2 (two) times daily. Patient not taking: Reported on 07/23/2022 05/26/22   Gwyneth Sprout, MD  midodrine (PROAMATINE) 10 MG tablet Take 10 mg by mouth daily. 12/23/21   [provider]  naloxone [facility]) nasal spray 4 mg/0.1 mL Place 1 spray into the nose as directed. 04/08/22   [provider]  nitroGLYCERIN (NITROSTAT) 0.4 MG SL tablet Place 1 tablet (0.4 mg total) under the tongue every 5 (five) minutes x 3 doses as needed for chest pain. 05/03/20   Margaretann Loveless, PA-C  Oxycodone HCl 10 MG TABS Take 10 mg by mouth every 6 (six) hours. 04/01/22   [provider]  pregabalin (LYRICA) 50 MG capsule Take 50 mg by mouth 2 (two) times daily. 06/12/22   [provider]  valACYclovir (VALTREX) 500 MG tablet TAKE (1) TABLET BY MOUTH EVERY OTHER DAY. Patient taking differently: Take 500 mg by mouth every other day. 01/24/21   Bacigalupo, Marzella Schlein, MD  VELPHORO 500 MG chewable tablet Chew 1,500 mg by mouth 3 (three) times daily. 09/11/21   [provider]  gabapentin (NEURONTIN) 100 MG capsule Take 1 capsule (100 mg total) by mouth 3 (three) times daily. 08/19/17 02/03/19  Enedina Finner, MD   Current Facility-Administered Medications  Medication Dose Route Frequency Provider Last Rate Last Admin   acetaminophen (TYLENOL) tablet 650 mg  650 mg Oral Q6H PRN Brimage, Vondra, DO       Or   acetaminophen (TYLENOL) suppository 650 mg  650 mg Rectal Q6H PRN Brimage, Vondra, DO       Chlorhexidine Gluconate Cloth 2 % PADS  6 each  6 each Topical Q0600 Zetta Bills, MD        heparin injection 5,000 Units  5,000 Units Subcutaneous Q8H Brimage, Vondra, DO       hydrALAZINE (APRESOLINE) injection 10 mg  10 mg Intravenous Q8H PRN Brimage, Vondra, DO       HYDROmorphone (DILAUDID) injection 0.5 mg  0.5 mg Intravenous Once Crosley, Debby, MD       insulin aspart (novoLOG) injection 0-6 Units  0-6 Units Subcutaneous TID WC Brimage, Vondra, DO       insulin detemir (LEVEMIR) injection 40 Units  40 Units Subcutaneous Daily Brimage, Vondra, DO       pantoprazole (PROTONIX) injection 40 mg  40 mg Intravenous Q24H Brimage, Vondra, DO       piperacillin-tazobactam (ZOSYN) IVPB 2.25 g  2.25 g Intravenous Q8H Davis, Farwell, RPH 100 mL/hr at 10/21/22 0313 2.25 g at 10/21/22 0313   polyethylene glycol (MIRALAX / GLYCOLAX) packet 17 g  17 g Oral Daily PRN Katha Cabal, DO       vancomycin (VANCOCIN) IVPB 1000 mg/200 mL premix  1,000 mg Intravenous Q Latrelle Dodrill, [facility]       Labs: Basic Metabolic Panel: Recent Labs  Lab 10/20/22 1435 10/20/22 1544 10/20/22 1545 10/21/22 0329  NA 134* 134* 134* 136  K 4.7 4.7 4.7 4.9  CL 93*  --  101 98  CO2 23  --   --  24  GLUCOSE 219*  --  205* 114*  BUN 65*  --  61* 66*  CREATININE 12.18*  --  13.30* 12.62*  CALCIUM 8.6*  --   --  8.2*   Liver Function Tests: Recent Labs  Lab 10/20/22 1435 10/21/22 0329  AST 15 10*  ALT 18 15  ALKPHOS 273* 233*  BILITOT 0.4 0.6  PROT 7.0 6.3*  ALBUMIN 3.5 3.0*   Recent Labs  Lab 10/20/22 1600  AMMONIA 17   CBC: Recent Labs  Lab 10/20/22 1435 10/20/22 1544 10/20/22 1545 10/21/22 0329  WBC 9.2  --   --  8.4  HGB 12.7 15.3* 15.6* 12.0  HCT 41.0 45.0 46.0 38.4  MCV 90.1  --   --  88.7  PLT 173  --   --  155   Studies/Results: CT ABDOMEN PELVIS W CONTRAST  Result Date: 10/20/2022 CLINICAL DATA:  Acute abdominal pain EXAM: CT ABDOMEN AND PELVIS WITH CONTRAST TECHNIQUE: Multidetector CT imaging of the abdomen and pelvis was performed using the standard protocol  following bolus administration of intravenous contrast. RADIATION DOSE REDUCTION: This exam was performed according to the departmental dose-optimization program which includes automated exposure control, adjustment of the mA and/or kV according to patient size and/or use of iterative reconstruction technique. CONTRAST:  47mL OMNIPAQUE IOHEXOL 350 MG/ML SOLN COMPARISON:  CT abdomen and pelvis 05/26/2022 FINDINGS: Lower chest: There is atelectasis in the lung bases. Hepatobiliary: There is questionable nodular liver contour. Gallbladder surgically absent. No biliary ductal dilatation. Pancreas: Unremarkable. No pancreatic ductal dilatation or surrounding inflammatory changes. Spleen: Normal in size without focal abnormality. Adrenals/Urinary Tract: There are numerous bilateral renal cortical cysts and hypodensities which are too small to characterize measuring up to 12 mm. There is no hydronephrosis or perinephric fat stranding. The adrenal glands and bladder are within normal limits. Stomach/Bowel: There is diffuse colonic wall thickening with surrounding inflammation more prominent in the ascending and transverse colon. There is no pneumatosis, bowel obstruction or free air. The appendix is not visualized small bowel and  stomach are within normal limits. Stomach is within normal limits. Vascular/Lymphatic: Aortic atherosclerosis. No enlarged abdominal or pelvic lymph nodes. Reproductive: Uterus and bilateral adnexa are unremarkable. Other: Small fat containing umbilical hernia.  No ascites. Musculoskeletal: No acute fractures are seen. IMPRESSION: 1. Pancolitis. 2. Questionable nodular liver contour. Correlate clinically for cirrhosis. 3. Bilateral renal cysts and hypodensities which are too small to characterize. No follow-up imaging necessary. Aortic Atherosclerosis (ICD10-I70.0). Electronically Signed   By: Darliss Cheney M.D.   On: 10/20/2022 17:29   CT Head Wo Contrast  Result Date: 10/20/2022 CLINICAL DATA:   Mental status change of unknown cause. Patient found on the floor. EXAM: CT HEAD WITHOUT CONTRAST CT CERVICAL SPINE WITHOUT CONTRAST TECHNIQUE: Multidetector CT imaging of the head and cervical spine was performed following the standard protocol without intravenous contrast. Multiplanar CT image reconstructions of the cervical spine were also generated. RADIATION DOSE REDUCTION: This exam was performed according to the departmental dose-optimization program which includes automated exposure control, adjustment of the mA and/or kV according to patient size and/or use of iterative reconstruction technique. COMPARISON:  05/26/2022. FINDINGS: CT HEAD FINDINGS Brain: No evidence of acute infarction, hemorrhage, hydrocephalus, extra-axial collection or mass lesion/mass effect. Vascular: No hyperdense vessel or unexpected calcification. Skull: Normal. Negative for fracture or focal lesion. Sinuses/Orbits: Globes and orbits are unremarkable. Mild mucosal thickening along the floor the right maxillary sinus. Sinuses otherwise clear. Other: None. CT CERVICAL SPINE FINDINGS Alignment: Slight reversal the normal cervical lordosis, apex at C5. No spondylolisthesis. Skull base and vertebrae: No acute fracture. No primary bone lesion or focal pathologic process. Soft tissues and spinal canal: No prevertebral fluid or swelling. No visible canal hematoma. Disc levels: Mild loss of disc height at C2-C3. Moderate loss of disc height at C5-C6 and C6-C7. Mild disc bulging. No convincing disc herniation. No significant stenosis. Upper chest: Upper lung ground-glass opacities suspected to be due to air trapping and small airways disease, but are nonspecific. Other: None. IMPRESSION: HEAD CT 1. No acute intracranial abnormalities CERVICAL CT 1. No fracture or acute skeletal abnormality Electronically Signed   By: Amie Portland M.D.   On: 10/20/2022 17:29   CT Cervical Spine Wo Contrast  Result Date: 10/20/2022 CLINICAL DATA:  Mental  status change of unknown cause. Patient found on the floor. EXAM: CT HEAD WITHOUT CONTRAST CT CERVICAL SPINE WITHOUT CONTRAST TECHNIQUE: Multidetector CT imaging of the head and cervical spine was performed following the standard protocol without intravenous contrast. Multiplanar CT image reconstructions of the cervical spine were also generated. RADIATION DOSE REDUCTION: This exam was performed according to the departmental dose-optimization program which includes automated exposure control, adjustment of the mA and/or kV according to patient size and/or use of iterative reconstruction technique. COMPARISON:  05/26/2022. FINDINGS: CT HEAD FINDINGS Brain: No evidence of acute infarction, hemorrhage, hydrocephalus, extra-axial collection or mass lesion/mass effect. Vascular: No hyperdense vessel or unexpected calcification. Skull: Normal. Negative for fracture or focal lesion. Sinuses/Orbits: Globes and orbits are unremarkable. Mild mucosal thickening along the floor the right maxillary sinus. Sinuses otherwise clear. Other: None. CT CERVICAL SPINE FINDINGS Alignment: Slight reversal the normal cervical lordosis, apex at C5. No spondylolisthesis. Skull base and vertebrae: No acute fracture. No primary bone lesion or focal pathologic process. Soft tissues and spinal canal: No prevertebral fluid or swelling. No visible canal hematoma. Disc levels: Mild loss of disc height at C2-C3. Moderate loss of disc height at C5-C6 and C6-C7. Mild disc bulging. No convincing disc herniation. No significant stenosis. Upper  chest: Upper lung ground-glass opacities suspected to be due to air trapping and small airways disease, but are nonspecific. Other: None. IMPRESSION: HEAD CT 1. No acute intracranial abnormalities CERVICAL CT 1. No fracture or acute skeletal abnormality Electronically Signed   By: Amie Portland M.D.   On: 10/20/2022 17:29   DG Foot Complete Left  Result Date: 10/20/2022 CLINICAL DATA:  Foot infection. EXAM:  LEFT FOOT - COMPLETE 3+ VIEW COMPARISON:  Left foot radiographs 07/22/2022 FINDINGS: Sequelae of interval great toe amputation are identified at the level of the MTP joint. There is gas/a wound within the overlying soft tissues at the amputation site. Subtle marginal erosions are questioned involving the first metatarsal head. There is a minimally displaced intra-articular fracture of the base of the proximal phalanx of the second toe which is new from the prior study but of indeterminate acuity. Diffuse soft tissue swelling is noted in the forefoot. There are atherosclerotic vascular calcifications. IMPRESSION: 1. Interval great toe amputation with overlying soft tissue wound. Questionable subtle marginal erosion at the first metatarsal head for which early osteomyelitis cannot be excluded. 2. Minimally displaced intra-articular fracture of the base of the second toe proximal phalanx, of indeterminate acuity. Electronically Signed   By: [name] M.D.   On: 10/20/2022 16:18   DG Chest 2 View  Result Date: 10/20/2022 CLINICAL DATA:  Altered mental status.  Left foot infection. EXAM: CHEST - 2 VIEW COMPARISON:  07/22/2022 FINDINGS: Mild elevation of the right hemidiaphragm is unchanged. Chronic linear densities at both lung bases are suggestive for areas of scarring. No new airspace disease or lung consolidation. Trachea is midline. Heart and mediastinum are within normal limits. Atherosclerotic calcifications at the aortic arch. No large pleural effusions. IMPRESSION: Bibasilar chest densities appear to be chronic and most compatible with areas of scarring. No acute chest findings. Electronically Signed   By: Richarda Overlie M.D.   On: 10/20/2022 16:11    Dialysis Orders:  TTS at [facility] -> last HD 4/4, cut time - post-weight 113.5kg 4:15hr, 450/A1.5, EDW 111.5kg, 2K/2Ca, LUE AVF, heparin 3000 unit bolus - Mircera IV q 2 weeks - appears to have missed last dose, last given 3/21 - Venofer  IV weekly -  Hectoral IV q HD  Assessment/Plan:  AMS: ?Due to infection, BUN not high enough that I would suspect uremia. Head CT negative. Seems a little better at this time - calm.   L foot osteomyelitis: Non-healing wound s/p L great toe amputation 09/08/22 - purulent drainage, x-Crooker suspicious for early osteo. Blood Cx collected, started on Vanc/Zosyn. Purplish area on dorsal foot - ?early calciphylaxis? Full ortho consult pending. ?Pancolitis: Per abdominal CT - per primary.  ESRD: Had missed 2 HD prior to admit, HD now - 2L UFG and tolerating.  Hypertension/volume: BP high, follow post-HD.  Anemia: Hgb > 12, no ESA needed for now.  Metabolic bone disease: Ca ok, Phos pending - resume home medications. Holding VDRA for now.  Nutrition: Alb low, adding supplements for wound healing.  CAD/HFrEF  Ozzie Hoyle, PA-C 10/21/2022, 11:19 AM  [employer]

## 2022-10-21 NOTE — Procedures (Signed)
I was present at this dialysis session. I have reviewed the session itself and  made appropriate changes.   Seen on HD. K is 4.9 on 2 K bath.  UF goal of 2L.  Using AVF LUE.   She awakens to voice but doesn't answer questions appropriately   [facility] Weights   10/20/22 2135 10/21/22 0247 10/21/22 0855  Weight: 115.5 kg 115.5 kg 115.5 kg    Recent Labs  Lab 10/21/22 0329  NA 136  K 4.9  CL 98  CO2 24  GLUCOSE 114*  BUN 66*  CREATININE 12.62*  CALCIUM 8.2*    Recent Labs  Lab 10/20/22 1435 10/20/22 1544 10/20/22 1545 10/21/22 0329  WBC 9.2  --   --  8.4  HGB 12.7 15.3* 15.6* 12.0  HCT 41.0 45.0 46.0 38.4  MCV 90.1  --   --  88.7  PLT 173  --   --  155    Scheduled Meds:  Chlorhexidine Gluconate Cloth  6 each Topical Q0600   heparin  5,000 Units Subcutaneous Q8H    HYDROmorphone (DILAUDID) injection  0.5 mg Intravenous Once   insulin aspart  0-6 Units Subcutaneous TID WC   insulin detemir  40 Units Subcutaneous Daily   pantoprazole (PROTONIX) IV  40 mg Intravenous Q24H   Continuous Infusions:  piperacillin-tazobactam (ZOSYN)  IV 2.25 g (10/21/22 0313)   vancomycin     PRN Meds:.acetaminophen **OR** acetaminophen, hydrALAZINE, polyethylene glycol   Sabra Heck  MD 10/21/2022, 9:49 AM

## 2022-10-21 NOTE — Progress Notes (Signed)
  Transition of Care [facility]) Screening Note   Patient Details  Name: [name] Date of Birth: [birth year]   [facility]) CM/SW Contact:    Kermit Balo, RN Phone Number: 10/21/2022, 4:04 PM   Attempted to get information but pt confused. Will reach out to her family tomorrow. Transition of Care Department [facility]) has reviewed patient. We will continue to monitor patient advancement through interdisciplinary progression rounds. If new patient transition needs arise, please place a TOC consult.

## 2022-10-21 NOTE — Consult Note (Addendum)
WOC Nurse Consult Note: Reason for Consult: Consult requested for left foot. Pt had previous toe amputation performed at [facility] system on 3/2. Full thickness wound is 50% red, 50% yellow, mod amt yellow drainage, bone palpable when probed with a swab. 3X2X5cm Dressing procedure/placement/frequency: X-Golphin indicated possible osteomyelitis and MRI is pending.  If results demonstrate this complex medical condition, then please consult ortho service for further plan of care, since it is beyond the scope of practice for WOC nurses.  Topical treatment orders provided for bedside nurses to perform as follows: Pack left great toe wound Q day with moist 2X2, using swab to fill, then cover with foam dressing.  Change foam dressing Q 3 days or PRN soiling. Please re-consult if further assistance is needed.  Thank-you,  Cammie Mcgee MSN, RN, CWOCN, [place], CNS [phone]

## 2022-10-21 NOTE — Consult Note (Signed)
ORTHOPAEDIC CONSULTATION  REQUESTING PHYSICIAN: Rhetta Mura, MD  Chief Complaint: Wound dehiscence left foot great toe amputation.  HPI: [name] is a 67 y.o. female who presents with dehiscence great toe amputation left foot.  Patient states she is a month out from surgery that was performed in [place].  Patient has a history of type 2 diabetes peripheral vascular disease end-stage renal disease on dialysis.  Past Medical History:  Diagnosis Date   Anemia    Aortic stenosis    Echo 8/18: mean 13, peak 28, LVOT/AV mean velocity 0.51   Arthritis    Asthma    As a child    Bronchitis    CAD (coronary artery disease)    a. 09/2016: 50% Ost 1st Mrg stenosis, 50% 2nd Mrg stenosis, 20% Mid-Cx, 95% Prox LAD, 40% mid-LAD, and 10% dist-LAD stenosis. Staged PCI with DES to Prox-LAD.    Chronic combined systolic and diastolic CHF (congestive heart failure) 2011   echo 2/18: EF 55-60, normal wall motion, grade 2 diastolic dysfunction, trivial AI // echo 3/18: Septal and apical HK, EF 45-50, normal wall motion, trivial AI, mild LAE, PASP 38 // echo 8/18: EF 60-65, normal wall motion, grade 1 diastolic dysfunction, calcified aortic valve leaflets, mild aortic stenosis (mean 13, peak 28, LVOT/AV mean velocity 0.51), mild AI, moderate MAC, mild LAE, trivial TR    Chronic kidney disease    STAGE 4   Chronic kidney disease on chronic dialysis    t, th, sat   Complication of anesthesia    Depression    Diabetes mellitus Dx 1989   Elevated lipids    GERD (gastroesophageal reflux disease)    Gout    Heart murmur    asymptomatic   Hepatitis C Dx 2013   Hypertension Dx 1989   Infected surgical wound    Lt arm   Myocardial infarction 07/2015   Obesity    Pancreatitis 2013   Pneumonia    Refusal of blood transfusions as patient is Jehovah's Witness    pt states she is not [place] witness and does not refuse blood products   Tendinitis    Tremors of nervous system    LEFT  HAND   Ulcer 2010   Past Surgical History:  Procedure Laterality Date   A/V FISTULAGRAM Left 04/11/2019   Procedure: A/V FISTULAGRAM;  Surgeon: Renford Dills, MD;  Location: [facility];  Service: Cardiovascular;  Laterality: Left;   A/V FISTULAGRAM Left 06/02/2019   Procedure: A/V FISTULAGRAM;  Surgeon: Renford Dills, MD;  Location: [facility];  Service: Cardiovascular;  Laterality: Left;   APPLICATION OF WOUND VAC Left 08/14/2017   Procedure: APPLICATION OF WOUND VAC Exchange;  Surgeon: Earline Mayotte, MD;  Location: [facility];  Service: General;  Laterality: Left;   APPLICATION OF WOUND VAC Left 12/21/2018   Procedure: APPLICATION OF WOUND VAC;  Surgeon: Renford Dills, MD;  Location: [facility];  Service: Vascular;  Laterality: Left;   AV FISTULA PLACEMENT Left 08/19/2018   Procedure: ARTERIOVENOUS (AV) FISTULA CREATION ( BRACHIOBASILIC );  Surgeon: Renford Dills, MD;  Location: [facility];  Service: Vascular;  Laterality: Left;   BASCILIC VEIN TRANSPOSITION Left 11/18/2018   Procedure: BASCILIC VEIN TRANSPOSITION;  Surgeon: Renford Dills, MD;  Location: [facility];  Service: Vascular;  Laterality: Left;   BIOPSY  09/20/2020   Procedure: BIOPSY;  Surgeon: Jeani Hawking, MD;  Location: [facility] ENDOSCOPY;  Service: Endoscopy;;   CHOLECYSTECTOMY  COLONOSCOPY WITH PROPOFOL N/A 02/03/2018   Procedure: COLONOSCOPY WITH PROPOFOL;  Surgeon: Toney Reil, MD;  [facility];  Service: Gastroenterology;  Laterality: N/A;   COLONOSCOPY WITH PROPOFOL N/A 07/25/2022   Procedure: COLONOSCOPY WITH PROPOFOL;  Surgeon: Regis Bill, MD;  Location: [facility];  Service: Endoscopy;  Laterality: N/A;   CORONARY ANGIOPLASTY  07/2015   STENT   CORONARY STENT INTERVENTION N/A 09/18/2016   Procedure: Coronary Stent Intervention;  Surgeon: Lennette Bihari, MD;  [facility];  Service: Cardiovascular;  Laterality: N/A;   DIALYSIS/PERMA  CATHETER INSERTION N/A 05/10/2018   Procedure: DIALYSIS/PERMA CATHETER INSERTION;  Surgeon: Renford Dills, MD;  Location: [facility];  Service: Cardiovascular;  Laterality: N/A;   DRESSING CHANGE UNDER ANESTHESIA Left 08/15/2017   Procedure: exploration of wound for bleeding;  Surgeon: Earline Mayotte, MD;  Location: [facility];  Service: General;  Laterality: Left;   ESOPHAGOGASTRODUODENOSCOPY (EGD) WITH PROPOFOL N/A 02/03/2018   Procedure: ESOPHAGOGASTRODUODENOSCOPY (EGD) WITH PROPOFOL;  Surgeon: Toney Reil, MD;  Location: [facility];  Service: Gastroenterology;  Laterality: N/A;   ESOPHAGOGASTRODUODENOSCOPY (EGD) WITH PROPOFOL N/A 09/20/2020   Procedure: ESOPHAGOGASTRODUODENOSCOPY (EGD) WITH PROPOFOL;  Surgeon: Jeani Hawking, MD;  Location: [facility] ENDOSCOPY;  Service: Endoscopy;  Laterality: N/A;   ESOPHAGOGASTRODUODENOSCOPY (EGD) WITH PROPOFOL N/A 07/25/2022   Procedure: ESOPHAGOGASTRODUODENOSCOPY (EGD) WITH PROPOFOL;  Surgeon: Regis Bill, MD;  Location: [facility];  Service: Endoscopy;  Laterality: N/A;   EYE SURGERY  11/17/2018   INCISION AND DRAINAGE ABSCESS Left 08/12/2017   Procedure: INCISION AND DRAINAGE ABSCESS;  Surgeon: Earline Mayotte, MD;  Location: [facility];  Service: General;  Laterality: Left;   KNEE ARTHROSCOPY     LEFT HEART CATH N/A 09/18/2016   Procedure: Left Heart Cath;  Surgeon: Lennette Bihari, MD;  [facility];  Service: Cardiovascular;  Laterality: N/A;   LEFT HEART CATH AND CORONARY ANGIOGRAPHY N/A 09/16/2016   Procedure: Left Heart Cath and Coronary Angiography;  Surgeon: Kathleene Hazel, MD;  Location: [facility];  Service: Cardiovascular;  Laterality: N/A;   LEFT HEART CATH AND CORONARY ANGIOGRAPHY N/A 04/29/2017   Procedure: LEFT HEART CATH AND CORONARY ANGIOGRAPHY;  Surgeon: Yvonne Kendall, MD;  [facility];  Service: Cardiovascular;  Laterality: N/A;   LOWER EXTREMITY ANGIOGRAPHY  Right 03/08/2018   Procedure: LOWER EXTREMITY ANGIOGRAPHY;  Surgeon: Renford Dills, MD;  Location: [facility];  Service: Cardiovascular;  Laterality: Right;   TUBAL LIGATION     TUBAL LIGATION     UPPER EXTREMITY ANGIOGRAPHY Right 09/19/2019   Procedure: UPPER EXTREMITY ANGIOGRAPHY;  Surgeon: Renford Dills, MD;  Location: [facility];  Service: Cardiovascular;  Laterality: Right;   WOUND DEBRIDEMENT Left 12/21/2018   Procedure: DEBRIDEMENT WOUND;  Surgeon: Renford Dills, MD;  Location: [facility];  Service: Vascular;  Laterality: Left;   WOUND DEBRIDEMENT Left 12/30/2018   Procedure: DEBRIDEMENT WOUND WITH VAC PLACEMENT (LEFT UPPER EXTREMITY);  Surgeon: Renford Dills, MD;  Location: [facility];  Service: Vascular;  Laterality: Left;   Social History   Socioeconomic History   Marital status: Divorced    Spouse name: Not on file   Number of children: 3   Years of education: Not on file   Highest education level: Bachelor's degree (e.g., BA, AB, BS)  Occupational History   Occupation: disability  Tobacco Use   Smoking status: Former    Packs/day: 0.25  Years: 6.00    Additional pack years: 0.00    Total pack years: 1.50    Types: Cigarettes    Quit date: 10/25/1980    Years since quitting: 42.0   Smokeless tobacco: Never  Vaping Use   Vaping Use: Never used  Substance and Sexual Activity   Alcohol use: Not Currently    Comment: rare   Drug use: Not Currently    Types: Marijuana   Sexual activity: Not on file    Comment: Not asked  Other Topics Concern   Not on file  Social History Narrative   Not on file   Social Determinants of Health   Financial Resource Strain: Low Risk  (12/26/2019)   Overall Financial Resource Strain (CARDIA)    Difficulty of Paying Living Expenses: Not hard at all  Food Insecurity: Food Insecurity Present (07/23/2022)   Hunger Vital Sign    Worried About Running Out of Food in the Last Year: Sometimes true    Ran  Out of Food in the Last Year: Sometimes true  Transportation Needs: No Transportation Needs (07/23/2022)   PRAPARE - Administrator, Civil Service (Medical): No    Lack of Transportation (Non-Medical): No  Physical Activity: Inactive (12/26/2019)   Exercise Vital Sign    Days of Exercise per Week: 0 days    Minutes of Exercise per Session: 0 min  Stress: Stress Concern Present (12/26/2019)   [employer] of Occupational Health - Occupational Stress Questionnaire    Feeling of Stress : To some extent  Social Connections: Socially Isolated (12/26/2019)   Social Connection and Isolation Panel [NHANES]    Frequency of Communication with Friends and Family: Twice a week    Frequency of Social Gatherings with Friends and Family: Never    Attends Religious Services: 1 to 4 times per year    Active Member of [employer] or Organizations: No    Attends Engineer, structural: Never    Marital Status: Divorced   Family History  Problem Relation Age of Onset   Colon cancer Mother    Heart attack Other    Heart attack Maternal Grandmother    Hypertension Sister    Hypertension Brother    Diabetes Paternal Grandmother    Breast cancer Neg Hx    - negative except otherwise stated in the family history section Allergies  Allergen Reactions   Shellfish Allergy Anaphylaxis and Swelling   Diazepam Other (See Comments)    "felt like out of body experience"   Morphine Itching    Tolerated hydromorphone on 07/2020 *will take along with Benadryl*   Prior to Admission medications   Medication Sig Start Date End Date Taking? Authorizing Provider  albuterol (VENTOLIN HFA) 108 (90 Base) MCG/ACT inhaler Inhale 2 puffs into the lungs every 4 (four) hours as needed for wheezing or shortness of breath. 06/28/20   Margaretann Loveless, PA-C  allopurinol (ZYLOPRIM) 100 MG tablet TAKE 1 TABLET BY MOUTH ONCE DAILY. 07/16/21   Malva Limes, MD  aspirin 81 MG EC tablet Take 1 tablet (81 mg  total) by mouth daily. 09/20/20   Leroy Sea, MD  atorvastatin (LIPITOR) 80 MG tablet Take 1 tablet (80 mg total) by mouth every evening. 04/24/21   Erasmo Downer, MD  carvedilol (COREG) 12.5 MG tablet Take 1 tablet (12.5 mg total) by mouth 2 (two) times daily with a meal. 09/19/21   Paige, Lucas Mallow, DO  Continuous Blood Gluc Receiver (FREESTYLE [place]  14 DAY READER) DEVI To check blood sugar ACHS 09/27/20   Margaretann Loveless, PA-C  Continuous Blood Gluc Sensor (FREESTYLE LIBRE 14 DAY SENSOR) MISC To check blood sugar ACHS; change every 14 days 09/27/20   Margaretann Loveless, PA-C  fluticasone [facility]) 50 MCG/ACT nasal spray Place 2 sprays into both nostrils daily as needed for allergies or rhinitis. 09/28/17   Margaretann Loveless, PA-C  fluticasone furoate-vilanterol (BREO ELLIPTA) 200-25 MCG/INH AEPB Inhale 1 puff into the lungs daily. Patient not taking: Reported on 04/15/2022 08/30/20   Margaretann Loveless, PA-C  Glucose Blood (BLOOD GLUCOSE TEST STRIPS 333) STRP Enough test stripes to test blood sugar TID x 30 days. No refills 04/16/22   Charise Killian, MD  HUMULIN 70/30 KWIKPEN (70-30) 100 UNIT/ML KwikPen Inject 30 Units into the skin 2 (two) times daily. 05/26/22   [provider]  hydrOXYzine (ATARAX/VISTARIL) 25 MG tablet Take 1 tablet (25 mg total) by mouth 4 (four) times daily. Patient taking differently: Take 25 mg by mouth 3 (three) times daily as needed. 12/03/20   Bacigalupo, Marzella Schlein, MD  Insulin NPH, Human,, Isophane, (NOVOLIN N FLEXPEN) 100 UNIT/ML Kiwkpen Inject 100 Units into the skin every morning. 05/26/22 06/25/22  Gwyneth Sprout, MD  Insulin Pen Needle 32G X 4 MM MISC Enough pen needles for 100 units of novolin QAM x 30 days. No refills 04/16/22   Charise Killian, MD  LEVEMIR FLEXPEN 100 UNIT/ML FlexPen Inject 60 Units into the skin daily. Patient not taking: Reported on 07/23/2022    [provider]  methocarbamol (ROBAXIN) 500 MG  tablet Take 1 tablet (500 mg total) by mouth 2 (two) times daily. Patient not taking: Reported on 07/23/2022 05/26/22   Gwyneth Sprout, MD  midodrine (PROAMATINE) 10 MG tablet Take 10 mg by mouth daily. 12/23/21   [provider]  naloxone [facility]) nasal spray 4 mg/0.1 mL Place 1 spray into the nose as directed. 04/08/22   [provider]  nitroGLYCERIN (NITROSTAT) 0.4 MG SL tablet Place 1 tablet (0.4 mg total) under the tongue every 5 (five) minutes x 3 doses as needed for chest pain. 05/03/20   Margaretann Loveless, PA-C  Oxycodone HCl 10 MG TABS Take 10 mg by mouth every 6 (six) hours. 04/01/22   [provider]  pregabalin (LYRICA) 50 MG capsule Take 50 mg by mouth 2 (two) times daily. 06/12/22   [provider]  valACYclovir (VALTREX) 500 MG tablet TAKE (1) TABLET BY MOUTH EVERY OTHER DAY. Patient taking differently: Take 500 mg by mouth every other day. 01/24/21   Bacigalupo, Marzella Schlein, MD  VELPHORO 500 MG chewable tablet Chew 1,500 mg by mouth 3 (three) times daily. 09/11/21   [provider]  gabapentin (NEURONTIN) 100 MG capsule Take 1 capsule (100 mg total) by mouth 3 (three) times daily. 08/19/17 02/03/19  Enedina Finner, MD   CT ABDOMEN PELVIS W CONTRAST  Result Date: 10/20/2022 CLINICAL DATA:  Acute abdominal pain EXAM: CT ABDOMEN AND PELVIS WITH CONTRAST TECHNIQUE: Multidetector CT imaging of the abdomen and pelvis was performed using the standard protocol following bolus administration of intravenous contrast. RADIATION DOSE REDUCTION: This exam was performed according to the departmental dose-optimization program which includes automated exposure control, adjustment of the mA and/or kV according to patient size and/or use of iterative reconstruction technique. CONTRAST:  75mL OMNIPAQUE IOHEXOL 350 MG/ML SOLN COMPARISON:  CT abdomen and pelvis 05/26/2022 FINDINGS: Lower chest: There is atelectasis in the lung bases. Hepatobiliary:  There is questionable  nodular liver contour. Gallbladder surgically absent. No biliary ductal dilatation. Pancreas: Unremarkable. No pancreatic ductal dilatation or surrounding inflammatory changes. Spleen: Normal in size without focal abnormality. Adrenals/Urinary Tract: There are numerous bilateral renal cortical cysts and hypodensities which are too small to characterize measuring up to 12 mm. There is no hydronephrosis or perinephric fat stranding. The adrenal glands and bladder are within normal limits. Stomach/Bowel: There is diffuse colonic wall thickening with surrounding inflammation more prominent in the ascending and transverse colon. There is no pneumatosis, bowel obstruction or free air. The appendix is not visualized small bowel and stomach are within normal limits. Stomach is within normal limits. Vascular/Lymphatic: Aortic atherosclerosis. No enlarged abdominal or pelvic lymph nodes. Reproductive: Uterus and bilateral adnexa are unremarkable. Other: Small fat containing umbilical hernia.  No ascites. Musculoskeletal: No acute fractures are seen. IMPRESSION: 1. Pancolitis. 2. Questionable nodular liver contour. Correlate clinically for cirrhosis. 3. Bilateral renal cysts and hypodensities which are too small to characterize. No follow-up imaging necessary. Aortic Atherosclerosis (ICD10-I70.0). Electronically Signed   By: Darliss Cheney M.D.   On: 10/20/2022 17:29   CT Head Wo Contrast  Result Date: 10/20/2022 CLINICAL DATA:  Mental status change of unknown cause. Patient found on the floor. EXAM: CT HEAD WITHOUT CONTRAST CT CERVICAL SPINE WITHOUT CONTRAST TECHNIQUE: Multidetector CT imaging of the head and cervical spine was performed following the standard protocol without intravenous contrast. Multiplanar CT image reconstructions of the cervical spine were also generated. RADIATION DOSE REDUCTION: This exam was performed according to the departmental dose-optimization program which includes automated exposure control,  adjustment of the mA and/or kV according to patient size and/or use of iterative reconstruction technique. COMPARISON:  05/26/2022. FINDINGS: CT HEAD FINDINGS Brain: No evidence of acute infarction, hemorrhage, hydrocephalus, extra-axial collection or mass lesion/mass effect. Vascular: No hyperdense vessel or unexpected calcification. Skull: Normal. Negative for fracture or focal lesion. Sinuses/Orbits: Globes and orbits are unremarkable. Mild mucosal thickening along the floor the right maxillary sinus. Sinuses otherwise clear. Other: None. CT CERVICAL SPINE FINDINGS Alignment: Slight reversal the normal cervical lordosis, apex at C5. No spondylolisthesis. Skull base and vertebrae: No acute fracture. No primary bone lesion or focal pathologic process. Soft tissues and spinal canal: No prevertebral fluid or swelling. No visible canal hematoma. Disc levels: Mild loss of disc height at C2-C3. Moderate loss of disc height at C5-C6 and C6-C7. Mild disc bulging. No convincing disc herniation. No significant stenosis. Upper chest: Upper lung ground-glass opacities suspected to be due to air trapping and small airways disease, but are nonspecific. Other: None. IMPRESSION: HEAD CT 1. No acute intracranial abnormalities CERVICAL CT 1. No fracture or acute skeletal abnormality Electronically Signed   By: Amie Portland M.D.   On: 10/20/2022 17:29   CT Cervical Spine Wo Contrast  Result Date: 10/20/2022 CLINICAL DATA:  Mental status change of unknown cause. Patient found on the floor. EXAM: CT HEAD WITHOUT CONTRAST CT CERVICAL SPINE WITHOUT CONTRAST TECHNIQUE: Multidetector CT imaging of the head and cervical spine was performed following the standard protocol without intravenous contrast. Multiplanar CT image reconstructions of the cervical spine were also generated. RADIATION DOSE REDUCTION: This exam was performed according to the departmental dose-optimization program which includes automated exposure control, adjustment  of the mA and/or kV according to patient size and/or use of iterative reconstruction technique. COMPARISON:  05/26/2022. FINDINGS: CT HEAD FINDINGS Brain: No evidence of acute infarction, hemorrhage, hydrocephalus, extra-axial collection or mass lesion/mass effect. Vascular: No hyperdense vessel or  unexpected calcification. Skull: Normal. Negative for fracture or focal lesion. Sinuses/Orbits: Globes and orbits are unremarkable. Mild mucosal thickening along the floor the right maxillary sinus. Sinuses otherwise clear. Other: None. CT CERVICAL SPINE FINDINGS Alignment: Slight reversal the normal cervical lordosis, apex at C5. No spondylolisthesis. Skull base and vertebrae: No acute fracture. No primary bone lesion or focal pathologic process. Soft tissues and spinal canal: No prevertebral fluid or swelling. No visible canal hematoma. Disc levels: Mild loss of disc height at C2-C3. Moderate loss of disc height at C5-C6 and C6-C7. Mild disc bulging. No convincing disc herniation. No significant stenosis. Upper chest: Upper lung ground-glass opacities suspected to be due to air trapping and small airways disease, but are nonspecific. Other: None. IMPRESSION: HEAD CT 1. No acute intracranial abnormalities CERVICAL CT 1. No fracture or acute skeletal abnormality Electronically Signed   By: Amie Portland M.D.   On: 10/20/2022 17:29   DG Foot Complete Left  Result Date: 10/20/2022 CLINICAL DATA:  Foot infection. EXAM: LEFT FOOT - COMPLETE 3+ VIEW COMPARISON:  Left foot radiographs 07/22/2022 FINDINGS: Sequelae of interval great toe amputation are identified at the level of the MTP joint. There is gas/a wound within the overlying soft tissues at the amputation site. Subtle marginal erosions are questioned involving the first metatarsal head. There is a minimally displaced intra-articular fracture of the base of the proximal phalanx of the second toe which is new from the prior study but of indeterminate acuity. Diffuse  soft tissue swelling is noted in the forefoot. There are atherosclerotic vascular calcifications. IMPRESSION: 1. Interval great toe amputation with overlying soft tissue wound. Questionable subtle marginal erosion at the first metatarsal head for which early osteomyelitis cannot be excluded. 2. Minimally displaced intra-articular fracture of the base of the second toe proximal phalanx, of indeterminate acuity. Electronically Signed   By: [name] M.D.   On: 10/20/2022 16:18   DG Chest 2 View  Result Date: 10/20/2022 CLINICAL DATA:  Altered mental status.  Left foot infection. EXAM: CHEST - 2 VIEW COMPARISON:  07/22/2022 FINDINGS: Mild elevation of the right hemidiaphragm is unchanged. Chronic linear densities at both lung bases are suggestive for areas of scarring. No new airspace disease or lung consolidation. Trachea is midline. Heart and mediastinum are within normal limits. Atherosclerotic calcifications at the aortic arch. No large pleural effusions. IMPRESSION: Bibasilar chest densities appear to be chronic and most compatible with areas of scarring. No acute chest findings. Electronically Signed   By: Richarda Overlie M.D.   On: 10/20/2022 16:11   - pertinent xrays, CT, MRI studies were reviewed and independently interpreted  Positive ROS: All other systems have been reviewed and were otherwise negative with the exception of those mentioned in the HPI and as above.  Physical Exam: General: Alert, no acute distress Psychiatric: Patient is competent for consent with normal mood and affect Lymphatic: No axillary or cervical lymphadenopathy Cardiovascular: No pedal edema Respiratory: No cyanosis, no use of accessory musculature GI: No organomegaly, abdomen is soft and non-tender    Images:  @ENCIMAGES @  Labs:  Lab Results  Component Value Date   HGBA1C 8.8 (H) 07/23/2022   HGBA1C 8.1 (A) 10/28/2021   HGBA1C 10.7 (A) 07/23/2021   ESRSEDRATE 26 (H) 10/21/2022   ESRSEDRATE 82 (H)  12/28/2018   ESRSEDRATE 55 (H) 12/27/2017   CRP 1.1 (H) 10/21/2022   CRP 5.5 (H) 04/23/2021   CRP 6.2 (H) 04/22/2021   LABURIC 9.9 (H) 07/23/2022   LABURIC 4.4 09/22/2020  LABURIC 6.9 01/31/2020   REPTSTATUS PENDING 10/20/2022   GRAMSTAIN  12/21/2018    MODERATE WBC PRESENT, PREDOMINANTLY PMN NO ORGANISMS SEEN Performed at [facility], [address] [place], [place] [address]    CULT  10/20/2022    NO GROWTH < 24 HOURS Performed at [facility], [address] [place], [place] [address]    [facility] 12/21/2018    Lab Results  Component Value Date   ALBUMIN 3.0 (L) 10/21/2022   ALBUMIN 3.5 10/20/2022   ALBUMIN 3.1 (L) 05/26/2022   PREALBUMIN 15.5 (L) 09/14/2020   LABURIC 9.9 (H) 07/23/2022   LABURIC 4.4 09/22/2020   LABURIC 6.9 01/31/2020        Latest Ref Rng & Units 10/21/2022    3:29 AM 10/20/2022    3:45 PM 10/20/2022    3:44 PM  CBC EXTENDED  WBC 4.0 - 10.5 K/uL 8.4     RBC 3.87 - 5.11 MIL/uL 4.33     Hemoglobin 12.0 - 15.0 g/dL 82.912.0  56.215.6  13.015.3   HCT 36.0 - 46.0 % 38.4  46.0  45.0   Platelets 150 - 400 K/uL 155       Neurologic: Patient does not have protective sensation bilateral lower extremities.   MUSCULOSKELETAL:   Skin: Examination there is a necrotic wound over the great toe first metatarsal head left foot with foul-smelling drainage.  Patient has ischemic changes to the dorsum of the left ankle.  I cannot palpate a dorsalis pedis pulse.  Most recent hemoglobin A1c 8.8.  Radiographs show air that extends down to the first metatarsal head.  I can palpate the first metatarsal head.  Radiographs shows calcification of the arteries out to the metatarsal head.  Assessment: Assessment: Diabetic insensate neuropathy end-stage renal disease on dialysis with peripheral vascular disease.  Plan: I will order ankle-brachial indices.  And will review the MRI scan.  At this point patient does not seem to have the  microcirculation to heal a first [place] amputation.  Thank you for the consult and the opportunity to see Ms. Atwater  Aldean BakerMarcus Sumayyah Custodio, MD [facility] [phone] 5:16 PM

## 2022-10-21 NOTE — Progress Notes (Signed)
   10/21/22 1345  Vitals  Temp 99.1 F (37.3 C)  Pulse Rate 86  Resp 16  BP 138/89  SpO2 99 %  O2 Device Room Air  Post Treatment  Dialyzer Clearance Lightly streaked  Duration of HD Treatment -hour(s) 3.5 hour(s)  Hemodialysis Intake (mL) 0 mL  Liters Processed 68.6  Fluid Removed (mL) 2000 mL  Tolerated HD Treatment Yes  AVG/AVF Arterial Site Held (minutes) 10 minutes  AVG/AVF Venous Site Held (minutes) 8 minutes   Received patient in bed to unit.  Alert and oriented.  Informed consent signed and in chart.   TX duration:3.5hrs  Patient tolerated well.  Transported back to the room  Alert, without acute distress.  Hand-off given to patient's nurse.   Access used: LUA AVF Access issues: HIGH vp  Total UF removed: 2L Medication(s) given: NONE    [name] [facility]

## 2022-10-21 NOTE — Consult Note (Signed)
Reason for Consult:Left foot osteo Referring Physician: Pleas Koch Time called: 1610 Time at bedside: 0911   [name] is an 67 y.o. female.  HPI: [name] was admitted with AMS yesterday. She was noted to have an ulceration on her left foot and orthopedic surgery was consulted. She is still confused today and not answering questions appropriately.   Past Medical History:  Diagnosis Date   Anemia    Aortic stenosis    Echo 8/18: mean 13, peak 28, LVOT/AV mean velocity 0.51   Arthritis    Asthma    As a child    Bronchitis    CAD (coronary artery disease)    a. 09/2016: 50% Ost 1st Mrg stenosis, 50% 2nd Mrg stenosis, 20% Mid-Cx, 95% Prox LAD, 40% mid-LAD, and 10% dist-LAD stenosis. Staged PCI with DES to Prox-LAD.    Chronic combined systolic and diastolic CHF (congestive heart failure) 2011   echo 2/18: EF 55-60, normal wall motion, grade 2 diastolic dysfunction, trivial AI // echo 3/18: Septal and apical HK, EF 45-50, normal wall motion, trivial AI, mild LAE, PASP 38 // echo 8/18: EF 60-65, normal wall motion, grade 1 diastolic dysfunction, calcified aortic valve leaflets, mild aortic stenosis (mean 13, peak 28, LVOT/AV mean velocity 0.51), mild AI, moderate MAC, mild LAE, trivial TR    Chronic kidney disease    STAGE 4   Chronic kidney disease on chronic dialysis    t, th, sat   Complication of anesthesia    Depression    Diabetes mellitus Dx 1989   Elevated lipids    GERD (gastroesophageal reflux disease)    Gout    Heart murmur    asymptomatic   Hepatitis C Dx 2013   Hypertension Dx 1989   Infected surgical wound    Lt arm   Myocardial infarction 07/2015   Obesity    Pancreatitis 2013   Pneumonia    Refusal of blood transfusions as patient is Jehov[employer]'s Witness    pt states she is not [place] witness and does not refuse blood products   Tendinitis    Tremors of nervous system    LEFT HAND   Ulcer 2010    Past Surgical History:  Procedure Laterality Date    A/V FISTULAGRAM Left 04/11/2019   Procedure: A/V FISTULAGRAM;  Surgeon: Renford Dills, MD;  Location: [facility];  Service: Cardiovascular;  Laterality: Left;   A/V FISTULAGRAM Left 06/02/2019   Procedure: A/V FISTULAGRAM;  Surgeon: Renford Dills, MD;  Location: [facility];  Service: Cardiovascular;  Laterality: Left;   APPLICATION OF WOUND VAC Left 08/14/2017   Procedure: APPLICATION OF WOUND VAC Exchange;  Surgeon: Earline Mayotte, MD;  Location: [facility];  Service: General;  Laterality: Left;   APPLICATION OF WOUND VAC Left 12/21/2018   Procedure: APPLICATION OF WOUND VAC;  Surgeon: Renford Dills, MD;  Location: [facility];  Service: Vascular;  Laterality: Left;   AV FISTULA PLACEMENT Left 08/19/2018   Procedure: ARTERIOVENOUS (AV) FISTULA CREATION ( BRACHIOBASILIC );  Surgeon: Renford Dills, MD;  Location: [facility];  Service: Vascular;  Laterality: Left;   BASCILIC VEIN TRANSPOSITION Left 11/18/2018   Procedure: BASCILIC VEIN TRANSPOSITION;  Surgeon: Renford Dills, MD;  Location: [facility];  Service: Vascular;  Laterality: Left;   BIOPSY  09/20/2020   Procedure: BIOPSY;  Surgeon: Jeani Hawking, MD;  Location: [facility] ENDOSCOPY;  Service: Endoscopy;;   CHOLECYSTECTOMY     COLONOSCOPY WITH PROPOFOL N/A  02/03/2018   Procedure: COLONOSCOPY WITH PROPOFOL;  Surgeon: Toney Reil, MD;  Location: [facility] ENDOSCOPY;  Service: Gastroenterology;  Laterality: N/A;   COLONOSCOPY WITH PROPOFOL N/A 07/25/2022   Procedure: COLONOSCOPY WITH PROPOFOL;  Surgeon: Regis Bill, MD;  Location: [facility] ENDOSCOPY;  Service: Endoscopy;  Laterality: N/A;   CORONARY ANGIOPLASTY  07/2015   STENT   CORONARY STENT INTERVENTION N/A 09/18/2016   Procedure: Coronary Stent Intervention;  Surgeon: Lennette Bihari, MD;  [facility];  Service: Cardiovascular;  Laterality: N/A;   DIALYSIS/PERMA CATHETER INSERTION N/A 05/10/2018   Procedure: DIALYSIS/PERMA CATHETER INSERTION;   Surgeon: Renford Dills, MD;  Location: [facility];  Service: Cardiovascular;  Laterality: N/A;   DRESSING CHANGE UNDER ANESTHESIA Left 08/15/2017   Procedure: exploration of wound for bleeding;  Surgeon: Earline Mayotte, MD;  Location: [facility];  Service: General;  Laterality: Left;   ESOPHAGOGASTRODUODENOSCOPY (EGD) WITH PROPOFOL N/A 02/03/2018   Procedure: ESOPHAGOGASTRODUODENOSCOPY (EGD) WITH PROPOFOL;  Surgeon: Toney Reil, MD;  Location: [facility] ENDOS[facility];  Service: Gastroenterology;  Laterality: N/A;   ESOPHAGOGASTRODUODENOSCOPY (EGD) WITH PROPOFOL N/A 09/20/2020   Procedure: ESOPHAGOGASTRODUODENOSCOPY (EGD) WITH PROPOFOL;  Surgeon: Jeani Hawking, MD;  Location: [facility] ENDOSCOPY;  Service: Endoscopy;  Laterality: N/A;   ESOPHAGOGASTRODUODENOSCOPY (EGD) WITH PROPOFOL N/A 07/25/2022   Procedure: ESOPHAGOGASTRODUODENOSCOPY (EGD) WITH PROPOFOL;  Surgeon: Regis Bill, MD;  Location: [facility] ENDOSCOPY;  Service: Endoscopy;  Laterality: N/A;   EYE SURGERY  11/17/2018   INCISION AND DRAINAGE ABSCESS Left 08/12/2017   Procedure: INCISION AND DRAINAGE ABSCESS;  Surgeon: Earline Mayotte, MD;  Location: [facility];  Service: General;  Laterality: Left;   KNEE ARTHROSCOPY     LEFT HEART CATH N/A 09/18/2016   Procedure: Left Heart Cath;  Surgeon: Lennette Bihari, MD;  [facility];  Service: Cardiovascular;  Laterality: N/A;   LEFT HEART CATH AND CORONARY ANGIOGRAPHY N/A 09/16/2016   Procedure: Left Heart Cath and Coronary Angiography;  Surgeon: Kathleene Hazel, MD;  Location: [facility] INVASIVE CV LAB;  Service: Cardiovascular;  Laterality: N/A;   LEFT HEART CATH AND CORONARY ANGIOGRAPHY N/A 04/29/2017   Procedure: LEFT HEART CATH AND CORONARY ANGIOGRAPHY;  Surgeon: Yvonne Kendall, MD;  [facility];  Service: Cardiovascular;  Laterality: N/A;   LOWER EXTREMITY ANGIOGRAPHY Right 03/08/2018   Procedure: LOWER EXTREMITY ANGIOGRAPHY;  Surgeon: Renford Dills, MD;  Location: [facility];  Service: Cardiovascular;  Laterality: Right;   TUBAL LIGATION     TUBAL LIGATION     UPPER EXTREMITY ANGIOGRAPHY Right 09/19/2019   Procedure: UPPER EXTREMITY ANGIOGRAPHY;  Surgeon: Renford Dills, MD;  Location: [facility];  Service: Cardiovascular;  Laterality: Right;   WOUND DEBRIDEMENT Left 12/21/2018   Procedure: DEBRIDEMENT WOUND;  Surgeon: Renford Dills, MD;  Location: [facility];  Service: Vascular;  Laterality: Left;   WOUND DEBRIDEMENT Left 12/30/2018   Procedure: DEBRIDEMENT WOUND WITH VAC PLACEMENT (LEFT UPPER EXTREMITY);  Surgeon: Renford Dills, MD;  Location: [facility];  Service: Vascular;  Laterality: Left;    Family History  Problem Relation Age of Onset   Colon cancer Mother    Heart attack Other    Heart attack Maternal Grandmother    Hypertension Sister    Hypertension Brother    Diabetes Paternal Grandmother    Breast cancer Neg Hx     Social History:  reports that she quit smoking about 42 years ago. Her smoking  use included cigarettes. She has a 1.50 pack-year smoking history. She has never used smokeless tobacco. She reports that she does not currently use alcohol. She reports that she does not currently use drugs after having used the following drugs: Marijuana.  Allergies:  Allergies  Allergen Reactions   Morphine Itching    Tolerated hydromorphone on 07/2020 *will take along with Benadryl*   Shellfish Allergy Anaphylaxis and Swelling   Diazepam Other (See Comments)    "felt like out of body experience"    Medications: I have reviewed the patient's current medications.  Results for orders placed or performed during the hospital encounter of 10/20/22 (from the past 48 hour(s))  Comprehensive metabolic panel     Status: Abnormal   Collection Time: 10/20/22  2:35 PM  Result Value Ref Range   Sodium 134 (L) 135 - 145 mmol/L   Potassium 4.7 3.5 - 5.1 mmol/L   Chloride 93 (L) 98 - 111 mmol/L   CO2  23 22 - 32 mmol/L   Glucose, Bld 219 (H) 70 - 99 mg/dL    Comment: Glucose reference range applies only to samples taken after fasting for at least 8 hours.   BUN 65 (H) 8 - 23 mg/dL   Creatinine, Ser 16.10 (H) 0.44 - 1.00 mg/dL   Calcium 8.6 (L) 8.9 - 10.3 mg/dL   Total Protein 7.0 6.5 - 8.1 g/dL   Albumin 3.5 3.5 - 5.0 g/dL   AST 15 15 - 41 U/L   ALT 18 0 - 44 U/L   Alkaline Phosphatase 273 (H) 38 - 126 U/L   Total Bilirubin 0.4 0.3 - 1.2 mg/dL   GFR, Estimated 3 (L) >60 mL/min    Comment: (NOTE) Calculated using the CKD-EPI Creatinine Equation (2021)    Anion gap 18 (H) 5 - 15    Comment: Performed at [facility] Lab, [address], [place], [place] [address]  CBC     Status: Abnormal   Collection Time: 10/20/22  2:35 PM  Result Value Ref Range   WBC 9.2 4.0 - 10.5 K/uL   RBC 4.55 3.87 - 5.11 MIL/uL   Hemoglobin 12.7 12.0 - 15.0 g/dL   HCT 40.9 81.1 - 91.4 %   MCV 90.1 80.0 - 100.0 fL   MCH 27.9 26.0 - 34.0 pg   MCHC 31.0 30.0 - 36.0 g/dL   RDW 78.2 (H) 95.6 - 21.3 %   Platelets 173 150 - 400 K/uL    Comment: REPEATED TO VERIFY   nRBC 0.0 0.0 - 0.2 %    Comment: Performed at [facility] Lab, [address], [place], [place] [address]  CBG monitoring, ED     Status: Abnormal   Collection Time: 10/20/22  2:50 PM  Result Value Ref Range   Glucose-Capillary 202 (H) 70 - 99 mg/dL    Comment: Glucose reference range applies only to samples taken after fasting for at least 8 hours.  Blood culture (routine x 2)     Status: None (Preliminary result)   Collection Time: 10/20/22  3:17 PM   Specimen: BLOOD  Result Value Ref Range   Specimen Description BLOOD BLOOD RIGHT FOREARM    Special Requests      BOTTLES DRAWN AEROBIC AND ANAEROBIC Blood Culture results may not be optimal due to an inadequate volume of blood received in culture bottles   Culture      NO GROWTH < 24 HOURS Performed at [facility] Lab, [address] [place], [place] [address]    Report Status  PENDING   Blood culture (routine x 2)     Status: None (Preliminary result)   Collection Time: 10/20/22  3:38 PM   Specimen: BLOOD RIGHT ARM  Result Value Ref Range   Specimen Description BLOOD RIGHT ARM    Special Requests      BOTTLES DRAWN AEROBIC AND ANAEROBIC Blood Culture adequate volume   Culture      NO GROWTH < 24 HOURS Performed at [facility], [address] [place], [place] [address]    Report Status PENDING   I-Stat venous blood gas, ([facility] ED, [facility], [facility])     Status: Abnormal   Collection Time: 10/20/22  3:44 PM  Result Value Ref Range   pH, Ven 7.417 7.25 - 7.43   pCO2, Ven 37.0 (L) 44 - 60 mmHg   pO2, Ven 61 (H) 32 - 45 mmHg   Bicarbonate 23.8 20.0 - 28.0 mmol/L   TCO2 25 22 - 32 mmol/L   O2 Saturation 91 %   Acid-Base Excess 0.0 0.0 - 2.0 mmol/L   Sodium 134 (L) 135 - 145 mmol/L   Potassium 4.7 3.5 - 5.1 mmol/L   Calcium, Ion 0.94 (L) 1.15 - 1.40 mmol/L   HCT 45.0 36.0 - 46.0 %   Hemoglobin 15.3 (H) 12.0 - 15.0 g/dL   Sample type VENOUS   I-stat chem 8, ED (not at [facility], [facility] or [facility])     Status: Abnormal   Collection Time: 10/20/22  3:45 PM  Result Value Ref Range   Sodium 134 (L) 135 - 145 mmol/L   Potassium 4.7 3.5 - 5.1 mmol/L   Chloride 101 98 - 111 mmol/L   BUN 61 (H) 8 - 23 mg/dL   Creatinine, Ser 09.81 (H) 0.44 - 1.00 mg/dL   Glucose, Bld 191 (H) 70 - 99 mg/dL    Comment: Glucose reference range applies only to samples taken after fasting for at least 8 hours.   Calcium, Ion 0.91 (L) 1.15 - 1.40 mmol/L   TCO2 24 22 - 32 mmol/L   Hemoglobin 15.6 (H) 12.0 - 15.0 g/dL   HCT 47.8 29.5 - 62.1 %  Ammonia     Status: None   Collection Time: 10/20/22  4:00 PM  Result Value Ref Range   Ammonia 17 9 - 35 umol/L    Comment: Performed at [facility], [address] [place], [place] [address]  Lactic acid, plasma     Status: None   Collection Time: 10/20/22  4:01 PM  Result Value Ref Range   Lactic Acid, Venous 1.5 0.5 - 1.9 mmol/L    Comment:  Performed at [facility], [address] [place], [place] [address]  Rapid urine drug screen (hospital performed)     Status: None   Collection Time: 10/20/22  4:24 PM  Result Value Ref Range   Opiates NONE DETECTED NONE DETECTED   Cocaine NONE DETECTED NONE DETECTED   Benzodiazepines NONE DETECTED NONE DETECTED   Amphetamines NONE DETECTED NONE DETECTED   Tetrahydrocannabinol NONE DETECTED NONE DETECTED   Barbiturates NONE DETECTED NONE DETECTED    Comment: (NOTE) DRUG SCREEN FOR MEDICAL PURPOSES ONLY.  IF CONFIRMATION IS NEEDED FOR ANY PURPOSE, NOTIFY LAB WITHIN 5 DAYS.  LOWEST DETECTABLE LIMITS FOR URINE DRUG SCREEN Drug Class                     Cutoff (ng/mL)  Amphetamine and metabolites    1000 Barbiturate and metabolites    200 Benzodiazepine                 200 Opiates and metabolites        300 Cocaine and metabolites        300 THC                            50 Performed at [facility] Lab, [address], [place], [place] [address]   Urinalysis, Routine w reflex microscopic -Urine, Clean Catch     Status: Abnormal   Collection Time: 10/20/22  4:25 PM  Result Value Ref Range   Color, Urine YELLOW YELLOW   APPearance CLEAR CLEAR   Specific Gravity, Urine 1.014 1.005 - 1.030   pH 6.0 5.0 - 8.0   Glucose, UA 150 (A) NEGATIVE mg/dL   Hgb urine dipstick SMALL (A) NEGATIVE   Bilirubin Urine NEGATIVE NEGATIVE   Ketones, ur NEGATIVE NEGATIVE mg/dL   Protein, ur 888 (A) NEGATIVE mg/dL   Nitrite NEGATIVE NEGATIVE   Leukocytes,Ua NEGATIVE NEGATIVE   RBC / HPF 0-5 0 - 5 RBC/hpf   WBC, UA 0-5 0 - 5 WBC/hpf   Bacteria, UA NONE SEEN NONE SEEN   Squamous Epithelial / HPF 0-5 0 - 5 /HPF   Mucus PRESENT     Comment: Performed at [facility], [address], [place], [place] [address]  Ethanol     Status: None   Collection Time: 10/20/22  6:00 PM  Result Value Ref Range   Alcohol, Ethyl (B) <10 <10 mg/dL    Comment: (NOTE) Lowest detectable limit for serum  alcohol is 10 mg/dL.  For medical purposes only. Performed at [facility], [address] [place], [place] [address]   Acetaminophen level     Status: Abnormal   Collection Time: 10/20/22  6:00 PM  Result Value Ref Range   Acetaminophen (Tylenol), Serum <10 (L) 10 - 30 ug/mL    Comment: (NOTE) Therapeutic concentrations vary significantly. A range of 10-30 ug/mL  may be an effective concentration for many patients. However, some  are best treated at concentrations outside of this range. Acetaminophen concentrations >150 ug/mL at 4 hours after ingestion  and >50 ug/mL at 12 hours after ingestion are often associated with  toxic reactions.  Performed at [facility], [address], [place], [place] [address]   Comprehensive metabolic panel     Status: Abnormal   Collection Time: 10/21/22  3:29 AM  Result Value Ref Range   Sodium 136 135 - 145 mmol/L   Potassium 4.9 3.5 - 5.1 mmol/L   Chloride 98 98 - 111 mmol/L   CO2 24 22 - 32 mmol/L   Glucose, Bld 114 (H) 70 - 99 mg/dL    Comment: Glucose reference range applies only to samples taken after fasting for at least 8 hours.   BUN 66 (H) 8 - 23 mg/dL   Creatinine, Ser 34.91 (H) 0.44 - 1.00 mg/dL   Calcium 8.2 (L) 8.9 - 10.3 mg/dL   Total Protein 6.3 (L) 6.5 - 8.1 g/dL   Albumin 3.0 (L) 3.5 - 5.0 g/dL   AST 10 (L) 15 - 41 U/L   ALT 15 0 - 44 U/L   Alkaline Phosphatase 233 (H) 38 - 126 U/L   Total Bilirubin 0.6 0.3 - 1.2 mg/dL   GFR,  Estimated 3 (L) >60 mL/min    Comment: (NOTE) Calculated using the CKD-EPI Creatinine Equation (2021)    Anion gap 14 5 - 15    Comment: Performed at [facility], [address] [place], [place] [address]  CBC     Status: Abnormal   Collection Time: 10/21/22  3:29 AM  Result Value Ref Range   WBC 8.4 4.0 - 10.5 K/uL   RBC 4.33 3.87 - 5.11 MIL/uL   Hemoglobin 12.0 12.0 - 15.0 g/dL   HCT 19.1 47.8 - 29.5 %   MCV 88.7 80.0 - 100.0 fL   MCH 27.7 26.0 - 34.0 pg   MCHC 31.3  30.0 - 36.0 g/dL   RDW 62.1 (H) 30.8 - 65.7 %   Platelets 155 150 - 400 K/uL    Comment: REPEATED TO VERIFY   nRBC 0.0 0.0 - 0.2 %    Comment: Performed at [facility], [address] [place], [place] [address]  Magnesium     Status: None   Collection Time: 10/21/22  3:29 AM  Result Value Ref Range   Magnesium 2.3 1.7 - 2.4 mg/dL    Comment: Performed at [facility], [address] [place], [place] [address]  Sedimentation rate     Status: Abnormal   Collection Time: 10/21/22  3:29 AM  Result Value Ref Range   Sed Rate 26 (H) 0 - 22 mm/hr    Comment: Performed at [facility], [address] [place], [place] [address]  C-reactive protein     Status: Abnormal   Collection Time: 10/21/22  3:29 AM  Result Value Ref Range   CRP 1.1 (H) <1.0 mg/dL    Comment: Performed at [facility] Lab, [address] [place], [place] [address]  Glucose, capillary     Status: Abnormal   Collection Time: 10/21/22  5:10 AM  Result Value Ref Range   Glucose-Capillary 106 (H) 70 - 99 mg/dL    Comment: Glucose reference range applies only to samples taken after fasting for at least 8 hours.  MRSA Next Gen by PCR, Nasal     Status: Abnormal   Collection Time: 10/21/22  5:26 AM   Specimen: Nasal Mucosa; Nasal Swab  Result Value Ref Range   MRSA by PCR Next Gen DETECTED (A) NOT DETECTED    Comment: RESULT CALLED TO, READ BACK BY AND VERIFIED WITH: T. VAUGHN 10/21/22 @ 0725 BY AB (NOTE) The GeneXpert MRSA Assay (FDA approved for NASAL specimens only), is one component of a comprehensive MRSA colonization surveillance program. It is not intended to diagnose MRSA infection nor to guide or monitor treatment for MRSA infections. Test performance is not FDA approved in patients less than 78 years old. Performed at [facility], [address] [place], [place] [address]   Troponin I (High Sensitivity)     Status: Abnormal   Collection Time: 10/21/22  5:40 AM  Result Value  Ref Range   Troponin I (High Sensitivity) 25 (H) <18 ng/L    Comment: (NOTE) Elevated high sensitivity troponin I (hsTnI) values and significant  changes across serial measurements may suggest ACS but many other  chronic and acute conditions are known to elevate hsTnI results.  Refer to the "Links" section for chest pain algorithms and additional  guidance. Performed at [facility], [address] [place], [place] [address]   Glucose, capillary     Status: Abnormal  Collection Time: 10/21/22  8:19 AM  Result Value Ref Range   Glucose-Capillary 110 (H) 70 - 99 mg/dL    Comment: Glucose reference range applies only to samples taken after fasting for at least 8 hours.   *Note: Due to a large number of results and/or encounters for the requested time period, some results have not been displayed. A complete set of results can be found in Results Review.    CT ABDOMEN PELVIS W CONTRAST  Result Date: 10/20/2022 CLINICAL DATA:  Acute abdominal pain EXAM: CT ABDOMEN AND PELVIS WITH CONTRAST TECHNIQUE: Multidetector CT imaging of the abdomen and pelvis was performed using the standard protocol following bolus administration of intravenous contrast. RADIATION DOSE REDUCTION: This exam was performed according to the departmental dose-optimization program which includes automated exposure control, adjustment of the mA and/or kV according to patient size and/or use of iterative reconstruction technique. CONTRAST:  60mL OMNIPAQUE IOHEXOL 350 MG/ML SOLN COMPARISON:  CT abdomen and pelvis 05/26/2022 FINDINGS: Lower chest: There is atelectasis in the lung bases. Hepatobiliary: There is questionable nodular liver contour. Gallbladder surgically absent. No biliary ductal dilatation. Pancreas: Unremarkable. No pancreatic ductal dilatation or surrounding inflammatory changes. Spleen: Normal in size without focal abnormality. Adrenals/Urinary Tract: There are numerous bilateral renal cortical cysts and  hypodensities which are too small to characterize measuring up to 12 mm. There is no hydronephrosis or perinephric fat stranding. The adrenal glands and bladder are within normal limits. Stomach/Bowel: There is diffuse colonic wall thickening with surrounding inflammation more prominent in the ascending and transverse colon. There is no pneumatosis, bowel obstruction or free air. The appendix is not visualized small bowel and stomach are within normal limits. Stomach is within normal limits. Vascular/Lymphatic: Aortic atherosclerosis. No enlarged abdominal or pelvic lymph nodes. Reproductive: Uterus and bilateral adnexa are unremarkable. Other: Small fat containing umbilical hernia.  No ascites. Musculoskeletal: No acute fractures are seen. IMPRESSION: 1. Pancolitis. 2. Questionable nodular liver contour. Correlate clinically for cirrhosis. 3. Bilateral renal cysts and hypodensities which are too small to characterize. No follow-up imaging necessary. Aortic Atherosclerosis (ICD10-I70.0). Electronically Signed   By: Darliss Cheney M.D.   On: 10/20/2022 17:29   CT Head Wo Contrast  Result Date: 10/20/2022 CLINICAL DATA:  Mental status change of unknown cause. Patient found on the floor. EXAM: CT HEAD WITHOUT CONTRAST CT CERVICAL SPINE WITHOUT CONTRAST TECHNIQUE: Multidetector CT imaging of the head and cervical spine was performed following the standard protocol without intravenous contrast. Multiplanar CT image reconstructions of the cervical spine were also generated. RADIATION DOSE REDUCTION: This exam was performed according to the departmental dose-optimization program which includes automated exposure control, adjustment of the mA and/or kV according to patient size and/or use of iterative reconstruction technique. COMPARISON:  05/26/2022. FINDINGS: CT HEAD FINDINGS Brain: No evidence of acute infarction, hemorrhage, hydrocephalus, extra-axial collection or mass lesion/mass effect. Vascular: No hyperdense  vessel or unexpected calcification. Skull: Normal. Negative for fracture or focal lesion. Sinuses/Orbits: Globes and orbits are unremarkable. Mild mucosal thickening along the floor the right maxillary sinus. Sinuses otherwise clear. Other: None. CT CERVICAL SPINE FINDINGS Alignment: Slight reversal the normal cervical lordosis, apex at C5. No spondylolisthesis. Skull base and vertebrae: No acute fracture. No primary bone lesion or focal pathologic process. Soft tissues and spinal canal: No prevertebral fluid or swelling. No visible canal hematoma. Disc levels: Mild loss of disc height at C2-C3. Moderate loss of disc height at C5-C6 and C6-C7. Mild disc bulging. No convincing disc herniation. No significant stenosis.  Upper chest: Upper lung ground-glass opacities suspected to be due to air trapping and small airways disease, but are nonspecific. Other: None. IMPRESSION: HEAD CT 1. No acute intracranial abnormalities CERVICAL CT 1. No fracture or acute skeletal abnormality Electronically Signed   By: Amie Portlandavid  Ormond M.D.   On: 10/20/2022 17:29   CT Cervical Spine Wo Contrast  Result Date: 10/20/2022 CLINICAL DATA:  Mental status change of unknown cause. Patient found on the floor. EXAM: CT HEAD WITHOUT CONTRAST CT CERVICAL SPINE WITHOUT CONTRAST TECHNIQUE: Multidetector CT imaging of the head and cervical spine was performed following the standard protocol without intravenous contrast. Multiplanar CT image reconstructions of the cervical spine were also generated. RADIATION DOSE REDUCTION: This exam was performed according to the departmental dose-optimization program which includes automated exposure control, adjustment of the mA and/or kV according to patient size and/or use of iterative reconstruction technique. COMPARISON:  05/26/2022. FINDINGS: CT HEAD FINDINGS Brain: No evidence of acute infarction, hemorrhage, hydrocephalus, extra-axial collection or mass lesion/mass effect. Vascular: No hyperdense vessel or  unexpected calcification. Skull: Normal. Negative for fracture or focal lesion. Sinuses/Orbits: Globes and orbits are unremarkable. Mild mucosal thickening along the floor the right maxillary sinus. Sinuses otherwise clear. Other: None. CT CERVICAL SPINE FINDINGS Alignment: Slight reversal the normal cervical lordosis, apex at C5. No spondylolisthesis. Skull base and vertebrae: No acute fracture. No primary bone lesion or focal pathologic process. Soft tissues and spinal canal: No prevertebral fluid or swelling. No visible canal hematoma. Disc levels: Mild loss of disc height at C2-C3. Moderate loss of disc height at C5-C6 and C6-C7. Mild disc bulging. No convincing disc herniation. No significant stenosis. Upper chest: Upper lung ground-glass opacities suspected to be due to air trapping and small airways disease, but are nonspecific. Other: None. IMPRESSION: HEAD CT 1. No acute intracranial abnormalities CERVICAL CT 1. No fracture or acute skeletal abnormality Electronically Signed   By: Amie Portlandavid  Ormond M.D.   On: 10/20/2022 17:29   DG Foot Complete Left  Result Date: 10/20/2022 CLINICAL DATA:  Foot infection. EXAM: LEFT FOOT - COMPLETE 3+ VIEW COMPARISON:  Left foot radiographs 07/22/2022 FINDINGS: Sequelae of interval great toe amputation are identified at the level of the MTP joint. There is gas/a wound within the overlying soft tissues at the amputation site. Subtle marginal erosions are questioned involving the first metatarsal head. There is a minimally displaced intra-articular fracture of the base of the proximal phalanx of the second toe which is new from the prior study but of indeterminate acuity. Diffuse soft tissue swelling is noted in the forefoot. There are atherosclerotic vascular calcifications. IMPRESSION: 1. Interval great toe amputation with overlying soft tissue wound. Questionable subtle marginal erosion at the first metatarsal head for which early osteomyelitis cannot be excluded. 2.  Minimally displaced intra-articular fracture of the base of the second toe proximal phalanx, of indeterminate acuity. Electronically Signed   By: Sebastian AcheAllen  Grady M.D.   On: 10/20/2022 16:18   DG Chest 2 View  Result Date: 10/20/2022 CLINICAL DATA:  Altered mental status.  Left foot infection. EXAM: CHEST - 2 VIEW COMPARISON:  07/22/2022 FINDINGS: Mild elevation of the right hemidiaphragm is unchanged. Chronic linear densities at both lung bases are suggestive for areas of scarring. No new airspace disease or lung consolidation. Trachea is midline. Heart and mediastinum are within normal limits. Atherosclerotic calcifications at the aortic arch. No large pleural effusions. IMPRESSION: Bibasilar chest densities appear to be chronic and most compatible with areas of scarring. No acute chest  findings. Electronically Signed   By: Richarda Overlie M.D.   On: 10/20/2022 16:11    Review of Systems  Unable to perform ROS: Mental status change   Blood pressure (!) 166/65, pulse 84, temperature 99.3 F (37.4 C), resp. rate 13, height 5\' 5"  (1.651 m), weight 115.5 kg, SpO2 97 %. Physical Exam Constitutional:      General: She is not in acute distress.    Appearance: She is well-developed. She is not diaphoretic.  HENT:     Head: Normocephalic and atraumatic.  Eyes:     General: No scleral icterus.       Right eye: No discharge.        Left eye: No discharge.     Conjunctiva/sclera: Conjunctivae normal.  Cardiovascular:     Rate and Rhythm: Normal rate and regular rhythm.  Pulmonary:     Effort: Pulmonary effort is normal. No respiratory distress.  Musculoskeletal:     Cervical back: Normal range of motion.  Feet:     Comments: Left foot -- Ulceration at great toe amputation site. 2+ DP, 1+ PT. Unable to assess sensation or motor function. Skin:    General: Skin is warm and dry.  Neurological:     Mental Status: She is alert.  Psychiatric:     Comments: Confused     Assessment/Plan: Left foot  ulceration -- Will have Dr. Lajoyce Corners evaluate later today or in AM.    Freeman Caldron, PA-C Orthopedic Surgery [phone] 10/21/2022, 9:57 AM

## 2022-10-22 ENCOUNTER — Other Ambulatory Visit: Payer: Self-pay

## 2022-10-22 ENCOUNTER — Encounter (HOSPITAL_COMMUNITY): Payer: Self-pay | Admitting: Family Medicine

## 2022-10-22 ENCOUNTER — Inpatient Hospital Stay (HOSPITAL_COMMUNITY): Payer: 59

## 2022-10-22 DIAGNOSIS — I70262 Atherosclerosis of native arteries of extremities with gangrene, left leg: Secondary | ICD-10-CM

## 2022-10-22 DIAGNOSIS — G9340 Encephalopathy, unspecified: Secondary | ICD-10-CM | POA: Diagnosis not present

## 2022-10-22 LAB — RENAL FUNCTION PANEL
Albumin: 2.8 g/dL — ABNORMAL LOW (ref 3.5–5.0)
Anion gap: 19 — ABNORMAL HIGH (ref 5–15)
BUN: 34 mg/dL — ABNORMAL HIGH (ref 8–23)
CO2: 22 mmol/L (ref 22–32)
Calcium: 8.5 mg/dL — ABNORMAL LOW (ref 8.9–10.3)
Chloride: 94 mmol/L — ABNORMAL LOW (ref 98–111)
Creatinine, Ser: 8.44 mg/dL — ABNORMAL HIGH (ref 0.44–1.00)
GFR, Estimated: 5 mL/min — ABNORMAL LOW (ref >60)
Glucose, Bld: 78 mg/dL (ref 70–99)
Phosphorus: 5.6 mg/dL — ABNORMAL HIGH (ref 2.5–4.6)
Potassium: 4 mmol/L (ref 3.5–5.1)
Sodium: 135 mmol/L (ref 135–145)

## 2022-10-22 LAB — CBC
HCT: 40.3 % (ref 36.0–46.0)
Hemoglobin: 12.4 g/dL (ref 12.0–15.0)
MCH: 27.9 pg (ref 26.0–34.0)
MCHC: 30.8 g/dL (ref 30.0–36.0)
MCV: 90.6 fL (ref 80.0–100.0)
Platelets: 135 10*3/uL — ABNORMAL LOW (ref 150–400)
RBC: 4.45 MIL/uL (ref 3.87–5.11)
RDW: 17.4 % — ABNORMAL HIGH (ref 11.5–15.5)
WBC: 8.9 10*3/uL (ref 4.0–10.5)
nRBC: 0 % (ref 0.0–0.2)

## 2022-10-22 LAB — C DIFFICILE QUICK SCREEN W PCR REFLEX
C Diff antigen: POSITIVE — AB
C Diff interpretation: DETECTED
C Diff toxin: POSITIVE — AB

## 2022-10-22 LAB — VAS US ABI WITH/WO TBI
Left ABI: 1.23
Right ABI: 1.25

## 2022-10-22 LAB — GLUCOSE, CAPILLARY
Glucose-Capillary: 73 mg/dL (ref 70–99)
Glucose-Capillary: 65 mg/dL — ABNORMAL LOW (ref 70–99)
Glucose-Capillary: 89 mg/dL (ref 70–99)
Glucose-Capillary: 83 mg/dL (ref 70–99)
Glucose-Capillary: 114 mg/dL — ABNORMAL HIGH (ref 70–99)

## 2022-10-22 LAB — CULTURE, BLOOD (ROUTINE X 2)

## 2022-10-22 MED ORDER — HEPARIN SODIUM (PORCINE) 1000 UNIT/ML DIALYSIS
2000.0000 [IU] | Freq: Once | INTRAMUSCULAR | Status: DC
  Filled 2022-10-22: qty 2

## 2022-10-22 MED ORDER — INSULIN DETEMIR 100 UNIT/ML ~~LOC~~ SOLN
20.0000 [IU] | Freq: Every day | SUBCUTANEOUS | Status: DC
  Administered 2022-10-23 – 2022-11-01 (×10): 20 [IU] via SUBCUTANEOUS
  Filled 2022-10-22 (×10): qty 0.2

## 2022-10-22 MED ORDER — GLUCOSE 40 % PO GEL
1.0000 | ORAL | Status: AC
  Administered 2022-10-22: 31 g via ORAL
  Filled 2022-10-22: qty 1.21

## 2022-10-22 MED ORDER — VANCOMYCIN HCL 125 MG PO CAPS
125.0000 mg | ORAL_CAPSULE | Freq: Four times a day (QID) | ORAL | Status: DC
  Administered 2022-10-22 – 2022-11-01 (×34): 125 mg via ORAL
  Filled 2022-10-22 (×43): qty 1

## 2022-10-22 NOTE — Progress Notes (Signed)
PROGRESS NOTE   [name]  AOZ:[id number] DOB: Nov 29, 1955 DOA: 10/20/2022 PCP: Felix PaciniVanderburg, Laura Lee, FNP  Brief Narrative:  67 year old black female known ESRD HD TTS DM TY 2 HFpEF EF 60-65% 09/11/2021-CAD 09/2016 NSTEMI Hep C unclear if treated Gout History of colostomy status post takedown 11/2021 for necrotizing fasciitis of abdomen--EGD colonoscopy 07/2022 unremarkable Chronic right hip pain-initial concerns of avascular necrosis have not been confirmed in the past  Patient had great toe amputation with Dr. Kandis Cockingimothy Vogler and grew out Pseudomonas Prevotella and coagulase-negative staph and was told to complete 2 weeks of Cipro and Flagyl stop date 3/15  09/29/2022 visited orthopedics Dr. Emmit PomfretStephen Noris [facility] recently with Alphonsa OverallBrad Dixon for bilateral osteoarthritis of knees and was recommended pain management referral versus lubrication injection  Admitted 10/20/2022 altered mental status Sodium 134 BUNs/creatinine 61/13 (this is baseline) Alk phos 233 albumin 3 AST/ALT normal WBC 9.2 platelets 173--- ammonia was 17  CT abdomen chest pelvis = pancolitis nodular liver bilateral renal cysts hypodensities (does not recommend any follow-up imaging) CT head and CT cervical spine negative for any abnormality Foot x-Holloman interval to great toe amputation with overlying soft tissue wound questionable subtle margin erosion of first metatarsal head?  Early osteomyelitis minimally displaced intra-articular fracture base of second proximal toe  Started on Vanco and Zosyn,  4/11 MRI foot suggestive of early osteomyelitis  Hospital-Problem based course  Abdominal pain?  Colitis Reports today over a week of abdominal pain with diarrhea no nausea no dark stool no hematemesis Continue IV antibiotics keep clear liquid diet-white count being normal argues against severe infection although platelets are slightly down Monitor and cautiously graduate to soft diet by evening  Osteomyelitis first metatarsal  head Displaced intra-articular second proximal toe fracture CRP 1.1 ESR 26 MRI  as above Await Dr. Lajoyce Cornersuda follow-up as may need amputation Will hold aspirin until patient is seen  Metabolic encephalopathy likely infectious versus alteration of circadian cycle CT head is negative Resume Lyrica at 25 twice daily--she is on oxycodone 10 every 6 and I will replace that with low-dose 5 every 6 to prevent withdrawal She is much more coherent today and this is likely an infectious prodrome causing her confusion  ESRD AF KC TTS via LUE AVF Missed several sessions 4/6 and 10/20/2022 Defer to renal As is hypertensive hold midodrine 10 daily  Prior necrotizing fasciitis of the abdomen with colostomy takedown will continue broad-spectrum antibiotics for now  DM TY 2 C ABGs low as 60 this morning cut back Lantus to 20 units and reassess  DVT prophylaxis: Heparin Code Status: Full Family Communication: no family today at bedside Disposition:  Status is: Inpatient Remains inpatient appropriate because:   Remains altered and likely needs amputation leg versus transmet amputation      Subjective:  More coherent conversant--seems to understand issues with foot Tells me diarr for several days--no way to confirm  Objective: Vitals:   10/21/22 2024 10/22/22 0503 10/22/22 0612 10/22/22 0847  BP: (!) 160/68 (!) 161/61  (!) 139/55  Pulse: 86 72  83  Resp: 18 18  19   Temp: 99.7 F (37.6 C) 99.5 F (37.5 C)  99 F (37.2 C)  TempSrc:  Oral  Oral  SpO2: 97% 97%  95%  Weight:   112.2 kg   Height:        Intake/Output Summary (Last 24 hours) at 10/22/2022 0923 Last data filed at 10/22/2022 0815 Gross per 24 hour  Intake 536.24 ml  Output 2000 ml  Net -1463.76 ml    Filed Weights   10/21/22 0247 10/21/22 0855 10/22/22 0612  Weight: 115.5 kg 115.5 kg 112.2 kg    Examination:  EOMI thick neck Mallampati 4 no icterus no pallor neck soft supple S1-S2 no murmur tachycardic  slightly Abdomen obese painful in lower quadrant no rebound no gaurd Woudn not examined today   Data Reviewed: personally reviewed   CBC    Component Value Date/Time   WBC 8.9 10/22/2022 0259   RBC 4.45 10/22/2022 0259   HGB 12.4 10/22/2022 0259   HGB 9.7 (L) 09/27/2020 1417   HCT 40.3 10/22/2022 0259   HCT 28.9 (L) 09/27/2020 1417   PLT 135 (L) 10/22/2022 0259   PLT 209 09/27/2020 1417   MCV 90.6 10/22/2022 0259   MCV 89 09/27/2020 1417   MCH 27.9 10/22/2022 0259   MCHC 30.8 10/22/2022 0259   RDW 17.4 (H) 10/22/2022 0259   RDW 14.5 09/27/2020 1417   LYMPHSABS 1.5 05/26/2022 1100   LYMPHSABS 1.5 09/27/2020 1417   MONOABS 0.6 05/26/2022 1100   EOSABS 0.3 05/26/2022 1100   EOSABS 0.2 09/27/2020 1417   BASOSABS 0.1 05/26/2022 1100   BASOSABS 0.0 09/27/2020 1417      Latest Ref Rng & Units 10/22/2022    2:59 AM 10/21/2022    3:29 AM 10/20/2022    3:45 PM  CMP  Glucose 70 - 99 mg/dL 78  696  295   BUN 8 - 23 mg/dL 34  66  61   Creatinine 0.44 - 1.00 mg/dL 2.84  13.24  40.10   Sodium 135 - 145 mmol/L 135  136  134   Potassium 3.5 - 5.1 mmol/L 4.0  4.9  4.7   Chloride 98 - 111 mmol/L 94  98  101   CO2 22 - 32 mmol/L 22  24    Calcium 8.9 - 10.3 mg/dL 8.5  8.2    Total Protein 6.5 - 8.1 g/dL  6.3    Total Bilirubin 0.3 - 1.2 mg/dL  0.6    Alkaline Phos 38 - 126 U/L  233    AST 15 - 41 U/L  10    ALT 0 - 44 U/L  15       Radiology Studies: MR FOOT LEFT WO CONTRAST  Result Date: 10/22/2022 CLINICAL DATA:  Osteomyelitis, foot EXAM: MRI OF THE LEFT FOOT WITHOUT CONTRAST TECHNIQUE: Multiplanar, multisequence MR imaging of the left forefoot was performed. No intravenous contrast was administered. COMPARISON:  Left radiograph 10/30/2022 FINDINGS: Bones/Joint/Cartilage Postsurgical changes of great toe amputation. There is marrow edema in the residual first metatarsal head mildly low T1 marrow signal marrow edema and low T1 signal of the medial sesamoid. There is mild marrow  edema within the second toe proximal phalanx and the second metatarsal head, with preserved T1 marrow signal. Minimal second MTP joint fluid. No other significant marrow signal alteration. Ligaments Intact Lisfranc ligament. Muscles and Tendons Chronic denervation changes. Postsurgical changes of the great toe tendons. Soft tissues Diffuse soft tissue swelling of the foot. Medial forefoot soft tissue wound which extends to the surface of the residual first metatarsal head. Small amount of coalescent fluid underlying the ulcer and along the surface of the first metatarsal head measuring 1.0 x 0.6 x 0.5 cm (series 6, image 36, series 9, image 8). IMPRESSION: Medial forefoot soft tissue wound with underlying early osteomyelitis of the residual first metatarsal head and medial sesamoid. Small amount of coalescent fluid underlying the ulcer and  along the surface of the first metatarsal head measuring 1.0 x 0.6 x 0.5 cm, could be an abscess, correlate with drainage. Mild marrow edema within the second toe proximal phalanx and second metatarsal head with preserved T1 marrow signal, could reflect reactive marrow change or early osteomyelitis. Electronically Signed   By: Caprice Renshaw M.D.   On: 10/22/2022 07:51   CT ABDOMEN PELVIS W CONTRAST  Result Date: 10/20/2022 CLINICAL DATA:  Acute abdominal pain EXAM: CT ABDOMEN AND PELVIS WITH CONTRAST TECHNIQUE: Multidetector CT imaging of the abdomen and pelvis was performed using the standard protocol following bolus administration of intravenous contrast. RADIATION DOSE REDUCTION: This exam was performed according to the departmental dose-optimization program which includes automated exposure control, adjustment of the mA and/or kV according to patient size and/or use of iterative reconstruction technique. CONTRAST:  56mL OMNIPAQUE IOHEXOL 350 MG/ML SOLN COMPARISON:  CT abdomen and pelvis 05/26/2022 FINDINGS: Lower chest: There is atelectasis in the lung bases. Hepatobiliary:  There is questionable nodular liver contour. Gallbladder surgically absent. No biliary ductal dilatation. Pancreas: Unremarkable. No pancreatic ductal dilatation or surrounding inflammatory changes. Spleen: Normal in size without focal abnormality. Adrenals/Urinary Tract: There are numerous bilateral renal cortical cysts and hypodensities which are too small to characterize measuring up to 12 mm. There is no hydronephrosis or perinephric fat stranding. The adrenal glands and bladder are within normal limits. Stomach/Bowel: There is diffuse colonic wall thickening with surrounding inflammation more prominent in the ascending and transverse colon. There is no pneumatosis, bowel obstruction or free air. The appendix is not visualized small bowel and stomach are within normal limits. Stomach is within normal limits. Vascular/Lymphatic: Aortic atherosclerosis. No enlarged abdominal or pelvic lymph nodes. Reproductive: Uterus and bilateral adnexa are unremarkable. Other: Small fat containing umbilical hernia.  No ascites. Musculoskeletal: No acute fractures are seen. IMPRESSION: 1. Pancolitis. 2. Questionable nodular liver contour. Correlate clinically for cirrhosis. 3. Bilateral renal cysts and hypodensities which are too small to characterize. No follow-up imaging necessary. Aortic Atherosclerosis (ICD10-I70.0). Electronically Signed   By: Darliss Cheney M.D.   On: 10/20/2022 17:29   CT Head Wo Contrast  Result Date: 10/20/2022 CLINICAL DATA:  Mental status change of unknown cause. Patient found on the floor. EXAM: CT HEAD WITHOUT CONTRAST CT CERVICAL SPINE WITHOUT CONTRAST TECHNIQUE: Multidetector CT imaging of the head and cervical spine was performed following the standard protocol without intravenous contrast. Multiplanar CT image reconstructions of the cervical spine were also generated. RADIATION DOSE REDUCTION: This exam was performed according to the departmental dose-optimization program which includes  automated exposure control, adjustment of the mA and/or kV according to patient size and/or use of iterative reconstruction technique. COMPARISON:  05/26/2022. FINDINGS: CT HEAD FINDINGS Brain: No evidence of acute infarction, hemorrhage, hydrocephalus, extra-axial collection or mass lesion/mass effect. Vascular: No hyperdense vessel or unexpected calcification. Skull: Normal. Negative for fracture or focal lesion. Sinuses/Orbits: Globes and orbits are unremarkable. Mild mucosal thickening along the floor the right maxillary sinus. Sinuses otherwise clear. Other: None. CT CERVICAL SPINE FINDINGS Alignment: Slight reversal the normal cervical lordosis, apex at C5. No spondylolisthesis. Skull base and vertebrae: No acute fracture. No primary bone lesion or focal pathologic process. Soft tissues and spinal canal: No prevertebral fluid or swelling. No visible canal hematoma. Disc levels: Mild loss of disc height at C2-C3. Moderate loss of disc height at C5-C6 and C6-C7. Mild disc bulging. No convincing disc herniation. No significant stenosis. Upper chest: Upper lung ground-glass opacities suspected to be due to air  trapping and small airways disease, but are nonspecific. Other: None. IMPRESSION: HEAD CT 1. No acute intracranial abnormalities CERVICAL CT 1. No fracture or acute skeletal abnormality Electronically Signed   By: Amie Portland M.D.   On: 10/20/2022 17:29   CT Cervical Spine Wo Contrast  Result Date: 10/20/2022 CLINICAL DATA:  Mental status change of unknown cause. Patient found on the floor. EXAM: CT HEAD WITHOUT CONTRAST CT CERVICAL SPINE WITHOUT CONTRAST TECHNIQUE: Multidetector CT imaging of the head and cervical spine was performed following the standard protocol without intravenous contrast. Multiplanar CT image reconstructions of the cervical spine were also generated. RADIATION DOSE REDUCTION: This exam was performed according to the departmental dose-optimization program which includes automated  exposure control, adjustment of the mA and/or kV according to patient size and/or use of iterative reconstruction technique. COMPARISON:  05/26/2022. FINDINGS: CT HEAD FINDINGS Brain: No evidence of acute infarction, hemorrhage, hydrocephalus, extra-axial collection or mass lesion/mass effect. Vascular: No hyperdense vessel or unexpected calcification. Skull: Normal. Negative for fracture or focal lesion. Sinuses/Orbits: Globes and orbits are unremarkable. Mild mucosal thickening along the floor the right maxillary sinus. Sinuses otherwise clear. Other: None. CT CERVICAL SPINE FINDINGS Alignment: Slight reversal the normal cervical lordosis, apex at C5. No spondylolisthesis. Skull base and vertebrae: No acute fracture. No primary bone lesion or focal pathologic process. Soft tissues and spinal canal: No prevertebral fluid or swelling. No visible canal hematoma. Disc levels: Mild loss of disc height at C2-C3. Moderate loss of disc height at C5-C6 and C6-C7. Mild disc bulging. No convincing disc herniation. No significant stenosis. Upper chest: Upper lung ground-glass opacities suspected to be due to air trapping and small airways disease, but are nonspecific. Other: None. IMPRESSION: HEAD CT 1. No acute intracranial abnormalities CERVICAL CT 1. No fracture or acute skeletal abnormality Electronically Signed   By: Amie Portland M.D.   On: 10/20/2022 17:29   DG Foot Complete Left  Result Date: 10/20/2022 CLINICAL DATA:  Foot infection. EXAM: LEFT FOOT - COMPLETE 3+ VIEW COMPARISON:  Left foot radiographs 07/22/2022 FINDINGS: Sequelae of interval great toe amputation are identified at the level of the MTP joint. There is gas/a wound within the overlying soft tissues at the amputation site. Subtle marginal erosions are questioned involving the first metatarsal head. There is a minimally displaced intra-articular fracture of the base of the proximal phalanx of the second toe which is new from the prior study but of  indeterminate acuity. Diffuse soft tissue swelling is noted in the forefoot. There are atherosclerotic vascular calcifications. IMPRESSION: 1. Interval great toe amputation with overlying soft tissue wound. Questionable subtle marginal erosion at the first metatarsal head for which early osteomyelitis cannot be excluded. 2. Minimally displaced intra-articular fracture of the base of the second toe proximal phalanx, of indeterminate acuity. Electronically Signed   By: [name] M.D.   On: 10/20/2022 16:18   DG Chest 2 View  Result Date: 10/20/2022 CLINICAL DATA:  Altered mental status.  Left foot infection. EXAM: CHEST - 2 VIEW COMPARISON:  07/22/2022 FINDINGS: Mild elevation of the right hemidiaphragm is unchanged. Chronic linear densities at both lung bases are suggestive for areas of scarring. No new airspace disease or lung consolidation. Trachea is midline. Heart and mediastinum are within normal limits. Atherosclerotic calcifications at the aortic arch. No large pleural effusions. IMPRESSION: Bibasilar chest densities appear to be chronic and most compatible with areas of scarring. No acute chest findings. Electronically Signed   By: Meriel Pica.D.  On: 10/20/2022 16:11     Scheduled Meds:  (feeding supplement) PROSource Plus  30 mL Oral BID BM   Chlorhexidine Gluconate Cloth  6 each Topical Q0600   Chlorhexidine Gluconate Cloth  6 each Topical Q0600   heparin  5,000 Units Subcutaneous Q8H   insulin aspart  0-6 Units Subcutaneous TID WC   insulin detemir  40 Units Subcutaneous Daily   mupirocin ointment  1 Application Nasal BID   pantoprazole (PROTONIX) IV  40 mg Intravenous Q24H   pregabalin  25 mg Oral BID   Continuous Infusions:  sodium chloride 10 mL/hr at 10/22/22 0652   piperacillin-tazobactam (ZOSYN)  IV 2.25 g (10/22/22 0653)   vancomycin       LOS: 2 days   Time spent: 65  Rhetta Mura, MD [facility] To contact the attending provider between 7A-7P or the  covering provider during after hours 7P-7A, please log into the web site [email or web address] and access using universal [place] password for that web site. If you do not have the password, please call the hospital operator.  10/22/2022, 9:23 AM

## 2022-10-22 NOTE — TOC Initial Note (Addendum)
Transition of Care [facility]) - Initial/Assessment Note    Patient Details  Name: [name] MRN: [id number] Date of Birth: [birth year]  Transition of Care [facility]) CM/SW Contact:    Kermit Balo, RN Phone Number: 10/22/2022, 4:12 PM  Clinical Narrative:                 Pt more oriented today with CM visit. Family at the bedside and they are discussing 24 hour care for pt when she is ready to return home.  Awaiting work up with Dr Lajoyce Corners.  Pt will need therapy evals one decision on foot is made.  Family or [employer] provides the transport she needs.  Was managing her own medications but family to oversee them after this admission. TOC will follow for d/c disposition.  Expected Discharge Plan:  (to be determined) Barriers to Discharge: Continued Medical Work up   Patient Goals and CMS Choice   CMS Medicare.gov Compare Post Acute Care list provided to:: Patient Choice offered to / list presented to : Patient, Adult Children      Expected Discharge Plan and Services       Living arrangements for the past 2 months: Apartment                                      Prior Living Arrangements/Services Living arrangements for the past 2 months: Apartment Lives with:: Self Patient language and need for interpreter reviewed:: Yes Do you feel safe going back to the place where you live?: Yes      Need for Family Participation in Patient Care: Yes (Comment) Care giver support system in place?: Yes (comment) Current home services: DME (walker/ rollator/ bedside commode) Criminal Activity/Legal Involvement Pertinent to Current Situation/Hospitalization: No - Comment as needed  Activities of Daily Living      Permission Sought/Granted                  Emotional Assessment Appearance:: Appears stated age Attitude/Demeanor/Rapport: Engaged Affect (typically observed): Accepting Orientation: : Oriented to Self, Oriented to Place   Psych Involvement: No  (comment)  Admission diagnosis:  Pancolitis [K51.00] Encephalopathy [G93.40] Acute encephalopathy [G93.40] Other acute osteomyelitis of left foot [M86.172] Patient Active Problem List   Diagnosis Date Noted   Pancolitis 10/21/2022   Osteomyelitis of left foot 10/21/2022   Acute encephalopathy 10/20/2022   Cellulitis of left lower extremity 07/23/2022   Type 2 diabetes mellitus with chronic kidney disease, with long-term current use of insulin 07/23/2022   GERD without esophagitis 07/23/2022   Peripheral neuropathy 07/23/2022   GI bleed 09/17/2021   Severe sepsis 04/21/2021   COVID-19 virus infection 04/20/2021   Upper GI bleed 09/17/2020   Prolonged QT interval 09/17/2020   COPD (chronic obstructive pulmonary disease) 09/17/2020   Acute pulmonary edema 09/14/2020   Acute respiratory failure with hypoxia 09/13/2020   Allergy, unspecified, initial encounter 03/29/2020   Anaphylactic shock, unspecified, initial encounter 03/29/2020   Volume overload 01/30/2020   Pain in left upper arm 01/18/2020   Cellulitis 01/16/2020   Anemia due to end stage renal disease 01/16/2020   Diabetic polyneuropathy associated with type 2 diabetes mellitus 11/06/2019   ESRD on hemodialysis 11/06/2019   Nystagmus 11/06/2019   PVD (peripheral vascular disease) 11/06/2019   Vertigo of central origin 11/06/2019   Weakness    Hypervolemia associated with renal insufficiency 10/31/2019   BMI 45.0-49.9, adult 10/18/2019  Ascending aortic aneurysm 03/27/2019   Puncture wound without foreign body of left forearm, initial encounter 01/02/2019   Non-healing wound of upper extremity 12/28/2018   Unspecified open wound of left upper arm, initial encounter 12/06/2018   Complication from renal dialysis device 10/20/2018   Iron deficiency anemia, unspecified 10/18/2018   ESRD (end stage renal disease) 06/01/2018   ARF (acute renal failure) 05/09/2018   Colon cancer screening    LGSIL on Pap smear of cervix  01/27/2018   Gout 12/25/2017   AKI (acute kidney injury) 12/24/2017   Hyperglycemia 12/24/2017   Acute on chronic diastolic CHF (congestive heart failure) 10/03/2017   Chronic diastolic heart failure 09/30/2017   Lymphedema 09/30/2017   Acute metabolic encephalopathy 06/02/2017   Aortic stenosis    Acute pain of both knees 02/12/2017   Chronic gout due to renal impairment of multiple sites without tophus 02/12/2017   Esophageal dysphagia 02/12/2017   Osteoarthritis 01/22/2017   Diabetic hyperosmolar non-ketotic state 12/13/2016   CAD (coronary artery disease) 12/13/2016   Hyperlipidemia 12/13/2016   Hyperphosphatemia 12/13/2016   Acute diastolic CHF (congestive heart failure)    Hyperkalemia 08/21/2016   Asthma 11/29/2015   Depression 09/05/2015   GERD (gastroesophageal reflux disease) 03/25/2015   Environmental allergies 03/14/2015   SOB (shortness of breath) 02/15/2015   Chronic hepatitis C without hepatic coma 01/31/2015   Diabetic neuropathy 01/31/2015   OSA (obstructive sleep apnea) 01/01/2015   Poor dentition 11/13/2014   Essential hypertension 10/08/2014   Morbid (severe) obesity due to excess calories 10/08/2014   Shortness of breath    Localized, primary osteoarthritis 01/25/2014   Family history of diabetes mellitus 03/31/2013   Pain in limb 03/31/2013   Uncontrolled type 2 diabetes mellitus with hyperosmolar nonketotic hyperglycemia 03/25/2012    Class: Chronic   PCP:  Felix Pacini, FNP Pharmacy:   [id number] - [place], [place] - [address] [place] [address] Phone: [phone] Fax: [phone]     Social Determinants of Health (SDOH) Social History: SDOH Screenings   Food Insecurity: Food Insecurity Present (07/23/2022)  Housing: Low Risk  (07/23/2022)  Transportation Needs: No Transportation Needs (07/23/2022)  Utilities: Not At Risk (07/23/2022)  Alcohol Screen: Low Risk  (03/03/2021)  Depression  (PHQ2-9): High Risk (03/03/2021)  Financial Resource Strain: Low Risk  (12/26/2019)  Physical Activity: Inactive (12/26/2019)  Social Connections: Socially Isolated (12/26/2019)  Stress: Stress Concern Present (12/26/2019)  Tobacco Use: Medium Risk (10/21/2022)   SDOH Interventions:     Readmission Risk Interventions    02/01/2020    3:17 PM  Readmission Risk Prevention Plan  Transportation Screening Complete  Medication Review (RN Care Manager) Complete  PCP or Specialist appointment within 3-5 days of discharge Complete  HRI or Home Care Consult Complete  SW Recovery Care/Counseling Consult Complete  Palliative Care Screening Not Applicable  Skilled Nursing Facility Not Applicable

## 2022-10-22 NOTE — Progress Notes (Signed)
VASCULAR LAB    ABI has been performed.  See CV proc for preliminary results.   Jarmel Linhardt, RVT 10/22/2022, 12:03 PM

## 2022-10-22 NOTE — Progress Notes (Signed)
[facility] Progress Note   Subjective:  Seen in room - awake and appropriate with questioning. Ortho was consulted - undergoing ABIs today - appears that does not likely have good blood flow to the foot. Abdomen remains tender, and she is on Vanc/Zosyn. She is due for dialysis today.  Objective Vitals:   10/21/22 2024 10/22/22 0503 10/22/22 0612 10/22/22 0847  BP: (!) 160/68 (!) 161/61  (!) 139/55  Pulse: 86 72  83  Resp: 18 18  19   Temp: 99.7 F (37.6 C) 99.5 F (37.5 C)  99 F (37.2 C)  TempSrc:  Oral  Oral  SpO2: 97% 97%  95%  Weight:   112.2 kg   Height:       Physical Exam General: Chronically ill appearing woman, NAD. Room air. Heart: RRR; no murmur Lungs: CTAB Abdomen: generalized tenderness across entire abdomen Extremities: no RLE edema, L foot with bandaged toe - purplish ischemic area to dorsum of foot Dialysis Access: LUE AVF + bruit  Additional Objective Labs: Basic Metabolic Panel: Recent Labs  Lab 10/20/22 1435 10/20/22 1544 10/20/22 1545 10/21/22 0329 10/22/22 0259  NA 134*   < > 134* 136 135  K 4.7   < > 4.7 4.9 4.0  CL 93*  --  101 98 94*  CO2 23  --   --  24 22  GLUCOSE 219*  --  205* 114* 78  BUN 65*  --  61* 66* 34*  CREATININE 12.18*  --  13.30* 12.62* 8.44*  CALCIUM 8.6*  --   --  8.2* 8.5*  PHOS  --   --   --   --  5.6*   < > = values in this interval not displayed.   Liver Function Tests: Recent Labs  Lab 10/20/22 1435 10/21/22 0329 10/22/22 0259  AST 15 10*  --   ALT 18 15  --   ALKPHOS 273* 233*  --   BILITOT 0.4 0.6  --   PROT 7.0 6.3*  --   ALBUMIN 3.5 3.0* 2.8*   CBC: Recent Labs  Lab 10/20/22 1435 10/20/22 1544 10/20/22 1545 10/21/22 0329 10/22/22 0259  WBC 9.2  --   --  8.4 8.9  HGB 12.7   < > 15.6* 12.0 12.4  HCT 41.0   < > 46.0 38.4 40.3  MCV 90.1  --   --  88.7 90.6  PLT 173  --   --  155 135*   < > = values in this interval not displayed.   Blood Culture    Component Value Date/Time    SDES BLOOD RIGHT ARM 10/20/2022 1538   SPECREQUEST  10/20/2022 1538    BOTTLES DRAWN AEROBIC AND ANAEROBIC Blood Culture adequate volume   CULT  10/20/2022 1538    NO GROWTH 2 DAYS Performed at [facility], [address] [place], [place] [id number]    REPTSTATUS PENDING 10/20/2022 1538   Studies/Results: MR FOOT LEFT WO CONTRAST  Result Date: 10/22/2022 CLINICAL DATA:  Osteomyelitis, foot EXAM: MRI OF THE LEFT FOOT WITHOUT CONTRAST TECHNIQUE: Multiplanar, multisequence MR imaging of the left forefoot was performed. No intravenous contrast was administered. COMPARISON:  Left radiograph 10/30/2022 FINDINGS: Bones/Joint/Cartilage Postsurgical changes of great toe amputation. There is marrow edema in the residual first metatarsal head mildly low T1 marrow signal marrow edema and low T1 signal of the medial sesamoid. There is mild marrow edema within the second toe proximal phalanx and the second metatarsal head, with  preserved T1 marrow signal. Minimal second MTP joint fluid. No other significant marrow signal alteration. Ligaments Intact Lisfranc ligament. Muscles and Tendons Chronic denervation changes. Postsurgical changes of the great toe tendons. Soft tissues Diffuse soft tissue swelling of the foot. Medial forefoot soft tissue wound which extends to the surface of the residual first metatarsal head. Small amount of coalescent fluid underlying the ulcer and along the surface of the first metatarsal head measuring 1.0 x 0.6 x 0.5 cm (series 6, image 36, series 9, image 8). IMPRESSION: Medial forefoot soft tissue wound with underlying early osteomyelitis of the residual first metatarsal head and medial sesamoid. Small amount of coalescent fluid underlying the ulcer and along the surface of the first metatarsal head measuring 1.0 x 0.6 x 0.5 cm, could be an abscess, correlate with drainage. Mild marrow edema within the second toe proximal phalanx and second metatarsal head with preserved T1 marrow  signal, could reflect reactive marrow change or early osteomyelitis. Electronically Signed   By: Caprice Renshaw M.D.   On: 10/22/2022 07:51   CT ABDOMEN PELVIS W CONTRAST  Result Date: 10/20/2022 CLINICAL DATA:  Acute abdominal pain EXAM: CT ABDOMEN AND PELVIS WITH CONTRAST TECHNIQUE: Multidetector CT imaging of the abdomen and pelvis was performed using the standard protocol following bolus administration of intravenous contrast. RADIATION DOSE REDUCTION: This exam was performed according to the departmental dose-optimization program which includes automated exposure control, adjustment of the mA and/or kV according to patient size and/or use of iterative reconstruction technique. CONTRAST:  75mL OMNIPAQUE IOHEXOL 350 MG/ML SOLN COMPARISON:  CT abdomen and pelvis 05/26/2022 FINDINGS: Lower chest: There is atelectasis in the lung bases. Hepatobiliary: There is questionable nodular liver contour. Gallbladder surgically absent. No biliary ductal dilatation. Pancreas: Unremarkable. No pancreatic ductal dilatation or surrounding inflammatory changes. Spleen: Normal in size without focal abnormality. Adrenals/Urinary Tract: There are numerous bilateral renal cortical cysts and hypodensities which are too small to characterize measuring up to 12 mm. There is no hydronephrosis or perinephric fat stranding. The adrenal glands and bladder are within normal limits. Stomach/Bowel: There is diffuse colonic wall thickening with surrounding inflammation more prominent in the ascending and transverse colon. There is no pneumatosis, bowel obstruction or free air. The appendix is not visualized small bowel and stomach are within normal limits. Stomach is within normal limits. Vascular/Lymphatic: Aortic atherosclerosis. No enlarged abdominal or pelvic lymph nodes. Reproductive: Uterus and bilateral adnexa are unremarkable. Other: Small fat containing umbilical hernia.  No ascites. Musculoskeletal: No acute fractures are seen.  IMPRESSION: 1. Pancolitis. 2. Questionable nodular liver contour. Correlate clinically for cirrhosis. 3. Bilateral renal cysts and hypodensities which are too small to characterize. No follow-up imaging necessary. Aortic Atherosclerosis (ICD10-I70.0). Electronically Signed   By: Darliss Cheney M.D.   On: 10/20/2022 17:29   CT Head Wo Contrast  Result Date: 10/20/2022 CLINICAL DATA:  Mental status change of unknown cause. Patient found on the floor. EXAM: CT HEAD WITHOUT CONTRAST CT CERVICAL SPINE WITHOUT CONTRAST TECHNIQUE: Multidetector CT imaging of the head and cervical spine was performed following the standard protocol without intravenous contrast. Multiplanar CT image reconstructions of the cervical spine were also generated. RADIATION DOSE REDUCTION: This exam was performed according to the departmental dose-optimization program which includes automated exposure control, adjustment of the mA and/or kV according to patient size and/or use of iterative reconstruction technique. COMPARISON:  05/26/2022. FINDINGS: CT HEAD FINDINGS Brain: No evidence of acute infarction, hemorrhage, hydrocephalus, extra-axial collection or mass lesion/mass effect. Vascular: No hyperdense vessel  or unexpected calcification. Skull: Normal. Negative for fracture or focal lesion. Sinuses/Orbits: Globes and orbits are unremarkable. Mild mucosal thickening along the floor the right maxillary sinus. Sinuses otherwise clear. Other: None. CT CERVICAL SPINE FINDINGS Alignment: Slight reversal the normal cervical lordosis, apex at C5. No spondylolisthesis. Skull base and vertebrae: No acute fracture. No primary bone lesion or focal pathologic process. Soft tissues and spinal canal: No prevertebral fluid or swelling. No visible canal hematoma. Disc levels: Mild loss of disc height at C2-C3. Moderate loss of disc height at C5-C6 and C6-C7. Mild disc bulging. No convincing disc herniation. No significant stenosis. Upper chest: Upper lung  ground-glass opacities suspected to be due to air trapping and small airways disease, but are nonspecific. Other: None. IMPRESSION: HEAD CT 1. No acute intracranial abnormalities CERVICAL CT 1. No fracture or acute skeletal abnormality Electronically Signed   By: Amie Portland M.D.   On: 10/20/2022 17:29   CT Cervical Spine Wo Contrast  Result Date: 10/20/2022 CLINICAL DATA:  Mental status change of unknown cause. Patient found on the floor. EXAM: CT HEAD WITHOUT CONTRAST CT CERVICAL SPINE WITHOUT CONTRAST TECHNIQUE: Multidetector CT imaging of the head and cervical spine was performed following the standard protocol without intravenous contrast. Multiplanar CT image reconstructions of the cervical spine were also generated. RADIATION DOSE REDUCTION: This exam was performed according to the departmental dose-optimization program which includes automated exposure control, adjustment of the mA and/or kV according to patient size and/or use of iterative reconstruction technique. COMPARISON:  05/26/2022. FINDINGS: CT HEAD FINDINGS Brain: No evidence of acute infarction, hemorrhage, hydrocephalus, extra-axial collection or mass lesion/mass effect. Vascular: No hyperdense vessel or unexpected calcification. Skull: Normal. Negative for fracture or focal lesion. Sinuses/Orbits: Globes and orbits are unremarkable. Mild mucosal thickening along the floor the right maxillary sinus. Sinuses otherwise clear. Other: None. CT CERVICAL SPINE FINDINGS Alignment: Slight reversal the normal cervical lordosis, apex at C5. No spondylolisthesis. Skull base and vertebrae: No acute fracture. No primary bone lesion or focal pathologic process. Soft tissues and spinal canal: No prevertebral fluid or swelling. No visible canal hematoma. Disc levels: Mild loss of disc height at C2-C3. Moderate loss of disc height at C5-C6 and C6-C7. Mild disc bulging. No convincing disc herniation. No significant stenosis. Upper chest: Upper lung  ground-glass opacities suspected to be due to air trapping and small airways disease, but are nonspecific. Other: None. IMPRESSION: HEAD CT 1. No acute intracranial abnormalities CERVICAL CT 1. No fracture or acute skeletal abnormality Electronically Signed   By: Amie Portland M.D.   On: 10/20/2022 17:29   DG Foot Complete Left  Result Date: 10/20/2022 CLINICAL DATA:  Foot infection. EXAM: LEFT FOOT - COMPLETE 3+ VIEW COMPARISON:  Left foot radiographs 07/22/2022 FINDINGS: Sequelae of interval great toe amputation are identified at the level of the MTP joint. There is gas/a wound within the overlying soft tissues at the amputation site. Subtle marginal erosions are questioned involving the first metatarsal head. There is a minimally displaced intra-articular fracture of the base of the proximal phalanx of the second toe which is new from the prior study but of indeterminate acuity. Diffuse soft tissue swelling is noted in the forefoot. There are atherosclerotic vascular calcifications. IMPRESSION: 1. Interval great toe amputation with overlying soft tissue wound. Questionable subtle marginal erosion at the first metatarsal head for which early osteomyelitis cannot be excluded. 2. Minimally displaced intra-articular fracture of the base of the second toe proximal phalanx, of indeterminate acuity. Electronically Signed  By: [name] M.D.   On: 10/20/2022 16:18   DG Chest 2 View  Result Date: 10/20/2022 CLINICAL DATA:  Altered mental status.  Left foot infection. EXAM: CHEST - 2 VIEW COMPARISON:  07/22/2022 FINDINGS: Mild elevation of the right hemidiaphragm is unchanged. Chronic linear densities at both lung bases are suggestive for areas of scarring. No new airspace disease or lung consolidation. Trachea is midline. Heart and mediastinum are within normal limits. Atherosclerotic calcifications at the aortic arch. No large pleural effusions. IMPRESSION: Bibasilar chest densities appear to be chronic and  most compatible with areas of scarring. No acute chest findings. Electronically Signed   By: Richarda Overlie M.D.   On: 10/20/2022 16:11    Medications:  sodium chloride 10 mL/hr at 10/22/22 0652   piperacillin-tazobactam (ZOSYN)  IV 2.25 g (10/22/22 0653)   vancomycin      (feeding supplement) PROSource Plus  30 mL Oral BID BM   Chlorhexidine Gluconate Cloth  6 each Topical Q0600   Chlorhexidine Gluconate Cloth  6 each Topical Q0600   heparin  5,000 Units Subcutaneous Q8H   insulin aspart  0-6 Units Subcutaneous TID WC   insulin detemir  40 Units Subcutaneous Daily   mupirocin ointment  1 Application Nasal BID   pantoprazole (PROTONIX) IV  40 mg Intravenous Q24H   pregabalin  25 mg Oral BID    Dialysis Orders: TTS at [facility] -> last HD 4/4, cut time - post-weight 113.5kg 4:15hr, 450/A1.5, EDW 111.5kg, 2K/2Ca, LUE AVF, heparin 3000 unit bolus - Mircera IV q 2 weeks - appears to have missed last dose, last given 3/21 - Venofer 50mg  IV weekly - Hectoral IV q HD   Assessment/Plan:  AMS: ?Due to infection, doubt uremia. Head CT negative. Much improved today.  L foot osteomyelitis: Non-healing wound s/p L great toe amputation 09/08/22 - purulent drainage, x-Montag suspicious for early osteo. Blood Cx collected, started on Vanc/Zosyn. Purplish area on dorsal foot - ?early calciphylaxis v. other ischemic changes. Ortho has seen here - scheduled for ABIs. ?Pancolitis: Per abdominal CT and tender across abdomen - per primary.  ESRD: Had missed 2 HD prior to admit. HD 4/10 as make-up, for HD today per usual schedule.  Hypertension/volume: BP high, follow post-HD.  Anemia: Hgb > 12, no ESA needed for now.  Metabolic bone disease: Ca ok, Phos pending - resume home medications. Holding VDRA for now.  Nutrition: Alb low, continue supplements for wound healing.  CAD/HFrEF  Ozzie Hoyle, PA-C 10/22/2022, 10:11 AM  [employer]

## 2022-10-22 NOTE — Progress Notes (Signed)
Cdiff + Start oral vanc--needs 10 day-->2 week overlap with IV ABx, last day of Abx being considered day # 1 of Cdiff Rx  Pleas Koch, MD [facility] 4:56 PM

## 2022-10-23 DIAGNOSIS — G9340 Encephalopathy, unspecified: Secondary | ICD-10-CM | POA: Diagnosis not present

## 2022-10-23 LAB — CBC
HCT: 40.5 % (ref 36.0–46.0)
Hemoglobin: 12.9 g/dL (ref 12.0–15.0)
MCH: 27.9 pg (ref 26.0–34.0)
MCHC: 31.9 g/dL (ref 30.0–36.0)
MCV: 87.5 fL (ref 80.0–100.0)
Platelets: 159 10*3/uL (ref 150–400)
RBC: 4.63 MIL/uL (ref 3.87–5.11)
RDW: 17.2 % — ABNORMAL HIGH (ref 11.5–15.5)
WBC: 6.5 10*3/uL (ref 4.0–10.5)
nRBC: 0 % (ref 0.0–0.2)

## 2022-10-23 LAB — GASTROINTESTINAL PANEL BY PCR, STOOL (REPLACES STOOL CULTURE)

## 2022-10-23 LAB — RENAL FUNCTION PANEL
Albumin: 2.8 g/dL — ABNORMAL LOW (ref 3.5–5.0)
Anion gap: 15 (ref 5–15)
BUN: 19 mg/dL (ref 8–23)
CO2: 25 mmol/L (ref 22–32)
Calcium: 8.6 mg/dL — ABNORMAL LOW (ref 8.9–10.3)
Chloride: 94 mmol/L — ABNORMAL LOW (ref 98–111)
Creatinine, Ser: 5.61 mg/dL — ABNORMAL HIGH (ref 0.44–1.00)
GFR, Estimated: 8 mL/min — ABNORMAL LOW (ref >60)
Glucose, Bld: 111 mg/dL — ABNORMAL HIGH (ref 70–99)
Phosphorus: 3.6 mg/dL (ref 2.5–4.6)
Potassium: 3.5 mmol/L (ref 3.5–5.1)
Sodium: 134 mmol/L — ABNORMAL LOW (ref 135–145)

## 2022-10-23 LAB — GLUCOSE, CAPILLARY
Glucose-Capillary: 117 mg/dL — ABNORMAL HIGH (ref 70–99)
Glucose-Capillary: 101 mg/dL — ABNORMAL HIGH (ref 70–99)
Glucose-Capillary: 162 mg/dL — ABNORMAL HIGH (ref 70–99)
Glucose-Capillary: 133 mg/dL — ABNORMAL HIGH (ref 70–99)
Glucose-Capillary: 156 mg/dL — ABNORMAL HIGH (ref 70–99)

## 2022-10-23 LAB — HEPATITIS B SURFACE ANTIBODY, QUANTITATIVE: Hep B S AB Quant (Post): <3.5 m[IU]/mL — ABNORMAL LOW (ref >9.9)

## 2022-10-23 MED ORDER — CHLORHEXIDINE GLUCONATE CLOTH 2 % EX PADS
6.0000 | MEDICATED_PAD | Freq: Every day | CUTANEOUS | Status: DC
  Administered 2022-10-23 – 2022-10-24 (×2): 6 via TOPICAL

## 2022-10-23 MED ORDER — KIDNEY FAILURE BOOK: Freq: Once | Status: DC

## 2022-10-23 NOTE — Progress Notes (Signed)
[facility] Progress Note   Subjective:   Feeling better overall, tolerated some fluids this AM and has not had diarrhea yet today. Still having some abdominal pain. Denies SOB, CP, dizziness, nausea.   Objective Vitals:   10/23/22 0204 10/23/22 0249 10/23/22 0615 10/23/22 0809  BP: (!) 159/65  (!) 148/49 (!) 140/53  Pulse: 79  79 83  Resp: (!) 9   16  Temp: 98 F (36.7 C)  98.3 F (36.8 C) 98 F (36.7 C)  TempSrc: Oral  Oral Oral  SpO2: 98%  96% 93%  Weight:  107.3 kg    Height:       Physical Exam General: Alert female in NAD Heart: RRR, no murmurs, rubs or gallops Lungs: CTA bilaterally Abdomen: +BS, palpation deferred  Extremities: No edema b/l lower extremities, L foot with bandage Dialysis Access:  LUE AVF + bruit  Additional Objective Labs: Basic Metabolic Panel: Recent Labs  Lab 10/21/22 0329 10/22/22 0259 10/23/22 0715  NA 136 135 134*  K 4.9 4.0 3.5  CL 98 94* 94*  CO2 GLUCOSE 114* 78 111*  BUN 66* 34* 19  CREATININE 12.62* 8.44* 5.61*  CALCIUM 8.2* 8.5* 8.6*  PHOS  --  5.6* 3.6   Liver Function Tests: Recent Labs  Lab 10/20/22 1435 10/21/22 0329 10/22/22 0259 10/23/22 0715  AST 15 10*  --   --   ALT 18 15  --   --   ALKPHOS 273* 233*  --   --   BILITOT 0.4 0.6  --   --   PROT 7.0 6.3*  --   --   ALBUMIN 3.5 3.0* 2.8* 2.8*   No results for input(s): "LIPASE", "AMYLASE" in the last 168 hours. CBC: Recent Labs  Lab 10/20/22 1435 10/20/22 1544 10/21/22 0329 10/22/22 0259 10/23/22 0715  WBC 9.2  --  8.4 8.9 6.5  HGB 12.7   < > 12.0 12.4 12.9  HCT 41.0   < > 38.4 40.3 40.5  MCV 90.1  --  88.7 90.6 87.5  PLT 173  --  155 135* 159   < > = values in this interval not displayed.   Blood Culture    Component Value Date/Time   SDES BLOOD RIGHT ARM 10/20/2022 1538   SPECREQUEST  10/20/2022 1538    BOTTLES DRAWN AEROBIC AND ANAEROBIC Blood Culture adequate volume   CULT  10/20/2022 1538    NO GROWTH 3  DAYS Performed at [facility], [address] [place], [place] [address]    REPTSTATUS PENDING 10/20/2022 1538    Cardiac Enzymes: No results for input(s): "CKTOTAL", "CKMB", "CKMBINDEX", "TROPONINI" in the last 168 hours. CBG: Recent Labs  Lab 10/22/22 0855 10/22/22 1104 10/22/22 1615 10/23/22 0340 10/23/22 0731  GLUCAP 89 83 114* 117* 101*   Iron Studies: No results for input(s): "IRON", "TIBC", "TRANSFERRIN", "FERRITIN" in the last 72 hours. @ Studies/Results: VAS [place] ABI WITH/WO TBI  Result Date: 10/22/2022  LOWER EXTREMITY DOPPLER STUDY Patient Name:  DAYZEE TROWER  Date of Exam:   10/22/2022 Medical Rec #: [id number]      Accession #:    [id number] Date of Birth: 10-Jun-1956      Patient Gender: F Patient Age:   67 years Exam Location:  [facility] Procedure:      VAS [place] ABI WITH/WO TBI Referring Phys: MARCUS DUDA --------------------------------------------------------------------------------  Indications: Peripheral artery disease. Left great toe amputation with wound  dehiscence High Risk Factors: Diabetes, past history of smoking, coronary artery disease.  Limitations: Today's exam was limited due to Left dialysis access, left great              toe amputation. Comparison Study: No prior study on file Performing Technologist: Sherren Kerns RVS  Examination Guidelines: A complete evaluation includes at minimum, Doppler waveform signals and systolic blood pressure reading at the level of bilateral brachial, anterior tibial, and posterior tibial arteries, when vessel segments are accessible. Bilateral testing is considered an integral part of a complete examination. Photoelectric Plethysmograph (PPG) waveforms and toe systolic pressure readings are included as required and additional duplex testing as needed. Limited examinations for reoccurring indications may be performed as noted.  ABI Findings:  +---------+------------------+-----+-----------+--------+ Right    Rt Pressure (mmHg)IndexWaveform   Comment  +---------+------------------+-----+-----------+--------+ Brachial 150                    triphasic           +---------+------------------+-----+-----------+--------+ PTA      187               1.25 multiphasic         +---------+------------------+-----+-----------+--------+ DP       170               1.13 multiphasic         +---------+------------------+-----+-----------+--------+ Great Toe149               0.99 Normal              +---------+------------------+-----+-----------+--------+ +---------+------------------+-----+-----------+-----------------------+ Left     Lt Pressure (mmHg)IndexWaveform   Comment                 +---------+------------------+-----+-----------+-----------------------+ Brachial                                   restricted              +---------+------------------+-----+-----------+-----------------------+ PTA      184               1.23 multiphasic                        +---------+------------------+-----+-----------+-----------------------+ DP       165               1.10 multiphasic                        +---------+------------------+-----+-----------+-----------------------+ Great Toe                                  3rd toe normal waveform +---------+------------------+-----+-----------+-----------------------+ +-------+-----------+-----------------------+------------+------------+ ABI/TBIToday's ABIToday's TBI            Previous ABIPrevious TBI +-------+-----------+-----------------------+------------+------------+ Right  1.25       0.99                                            +-------+-----------+-----------------------+------------+------------+ Left   1.23       normal waveform 3rd toe                         +-------+-----------+-----------------------+------------+------------+  Summary: Right: Resting right ankle-brachial index is within normal range. The right toe-brachial index is normal. Left: Resting left ankle-brachial index is within normal range. *See table(s) above for measurements and observations.  Electronically signed by Heath Lark on 10/22/2022 at 4:52:29 PM.    Final    MR FOOT LEFT WO CONTRAST  Result Date: 10/22/2022 CLINICAL DATA:  Osteomyelitis, foot EXAM: MRI OF THE LEFT FOOT WITHOUT CONTRAST TECHNIQUE: Multiplanar, multisequence MR imaging of the left forefoot was performed. No intravenous contrast was administered. COMPARISON:  Left radiograph 10/30/2022 FINDINGS: Bones/Joint/Cartilage Postsurgical changes of great toe amputation. There is marrow edema in the residual first metatarsal head mildly low T1 marrow signal marrow edema and low T1 signal of the medial sesamoid. There is mild marrow edema within the second toe proximal phalanx and the second metatarsal head, with preserved T1 marrow signal. Minimal second MTP joint fluid. No other significant marrow signal alteration. Ligaments Intact Lisfranc ligament. Muscles and Tendons Chronic denervation changes. Postsurgical changes of the great toe tendons. Soft tissues Diffuse soft tissue swelling of the foot. Medial forefoot soft tissue wound which extends to the surface of the residual first metatarsal head. Small amount of coalescent fluid underlying the ulcer and along the surface of the first metatarsal head measuring 1.0 x 0.6 x 0.5 cm (series 6, image 36, series 9, image 8). IMPRESSION: Medial forefoot soft tissue wound with underlying early osteomyelitis of the residual first metatarsal head and medial sesamoid. Small amount of coalescent fluid underlying the ulcer and along the surface of the first metatarsal head measuring 1.0 x 0.6 x 0.5 cm, could be an abscess, correlate with drainage. Mild marrow edema within the second toe proximal phalanx and second metatarsal head with preserved T1 marrow signal,  could reflect reactive marrow change or early osteomyelitis. Electronically Signed   By: Caprice Renshaw M.D.   On: 10/22/2022 07:51   Medications:  sodium chloride 10 mL/hr at 10/22/22 0652   piperacillin-tazobactam (ZOSYN)  IV 2.25 g (10/23/22 0553)   vancomycin 1,000 mg (10/23/22 0457)    (feeding supplement) PROSource Plus  30 mL Oral BID BM   Chlorhexidine Gluconate Cloth  6 each Topical Q0600   Chlorhexidine Gluconate Cloth  6 each Topical Q0600   heparin  5,000 Units Subcutaneous Q8H   insulin aspart  0-6 Units Subcutaneous TID WC   insulin detemir  20 Units Subcutaneous Daily   kidney failure book   Does not apply Once   mupirocin ointment  1 Application Nasal BID   pantoprazole (PROTONIX) IV  40 mg Intravenous Q24H   pregabalin  25 mg Oral BID   vancomycin  125 mg Oral QID    Outpatient Dialysis Orders: TTS at [facility] -> last HD 4/4, cut time - post-weight 113.5kg 4:15hr, 450/A1.5, EDW 111.5kg, 2K/2Ca, LUE AVF, heparin 3000 unit bolus - Mircera IV q 2 weeks - appears to have missed last dose, last given 3/21 - Venofer 50mg  IV weekly - Hectoral IV q HD  Assessment/Plan:  AMS: ?Due to infection, doubt uremia. Head CT negative. Much improved today. 2.  L foot osteomyelitis: Non-healing wound s/p L great toe amputation 09/08/22 - purulent drainage, x-Imes suspicious for early osteo. Blood Cx collected, started on Vanc/Zosyn. Purplish area on dorsal foot - ?early calciphylaxis v. other ischemic changes. Ortho has seen here, ABIs completed 3. ?Pancolitis: Per abdominal CT and tender across abdomen - per primary. 4.  ESRD: Had missed 2 HD prior to admit. Now back on track,  continue TTS schedule 5.  Hypertension/volume: BP running high but improving, below her prior EDW. Will lower at dischage 6.  Anemia: Hgb > 12, no ESA needed for now. 7.  Metabolic bone disease: Ca ok, Phos controlled - resume home medications. Holding VDRA for now. 8.  Nutrition: Alb low, continue supplements  for wound healing.   Rogers Blocker, PA-C 10/23/2022, 9:36 AM  [facility] Pager: [phone]

## 2022-10-23 NOTE — Plan of Care (Signed)

## 2022-10-23 NOTE — Progress Notes (Signed)
   10/23/22 1032  Spiritual Encounters  Type of Visit Initial  Care provided to: Patient  Referral source Patient request  Reason for visit Advance directives  OnCall Visit No   Chaplain responded to spiritual consult for Advance Care Directive. Chaplain provide AD education and walked the PT through the paperwork.  PT will review document and converse with family. PT will contact spiritual care when ready to move forward having the document notarized and entered in the chart.

## 2022-10-23 NOTE — Progress Notes (Signed)
PROGRESS NOTE   [name]  ZOX:[id number] DOB: 07-15-1955 DOA: 10/20/2022 PCP: Heather Pacini, FNP  Brief Narrative:  [age] year old black female known ESRD HD TTS DM TY 2 HFpEF EF 60-65% 09/11/2021-CAD 09/2016 NSTEMI Hep C unclear if treated Gout History of colostomy status post takedown 11/2021 for necrotizing fasciitis of abdomen--EGD colonoscopy 07/2022 unremarkable Chronic right hip pain-initial concerns of avascular necrosis have not been confirmed in the past  Patient had great toe amputation with Dr. Kandis [name] and grew out Pseudomonas Prevotella and coagulase-negative staph and was told to complete 2 weeks of Cipro and Flagyl stop date 3/15  09/29/2022 visited orthopedics [name] recently with [name] for bilateral osteoarthritis of knees and was recommended pain management referral versus lubrication injection  Admitted 10/20/2022 altered mental status Sodium 134 BUNs/creatinine 61/13 (this is baseline) Alk phos 233 albumin 3 AST/ALT normal WBC 9.2 platelets 173--- ammonia was 17  CT abdomen chest pelvis = pancolitis nodular liver bilateral renal cysts hypodensities (does not recommend any follow-up imaging) CT head and CT cervical spine negative for any abnormality Foot x-Lad interval to great toe amputation with overlying soft tissue wound questionable subtle margin erosion of first metatarsal head?  Early osteomyelitis minimally displaced intra-articular fracture base of second proximal toe  Started on Vanco and Zosyn,  4/11 MRI foot suggestive of early osteomyelitis--- because of continued diarrhea found to be C. difficile positive on testing and started on oral Vanco 4/12 ABI shows resting R ABI within normal range, left ABI within normal range  Hospital-Problem based course  C. difficile colitis Retrospectively was recently on Cipro Flagyl until 3/15 so this was a risk factor Oral vancomycin started 4/11-symptoms have improved-expect to  treat for 14 days over above IV antibiotic duration for osteomyelitis  Osteomyelitis first metatarsal head Displaced intra-articular second proximal toe fracture CRP 1.1 ESR 26 MRI/ABI as per above Continue to hold aspirin for now  Metabolic encephalopathy likely 2/2 C. difficile Resume Lyrica at 25 twice daily--she is on oxycodone 10 every 6 and I will replace that with low-dose 5 every 6 to prevent withdrawal Would keep at lower doses of pain meds etc. on discharge  ESRD [facility] TTS via LUE AVF Missed several sessions 4/6 and 10/20/2022 Defer to renal As is hypertensive hold midodrine 10 daily  Prior necrotizing fasciitis of the abdomen with colostomy takedown will continue broad-spectrum antibiotics for now  DM TY 2 with complication of in-hospital hypoglycemia  CBGs improved 100-1 60 range Home MAR was incorrect-we did cut back back Lantus to 20 units   DVT prophylaxis: Heparin Code Status: Full Family Communication: Met with her large and supportive family yesterday on 4/11 Disposition:  Status is: Inpatient Remains inpatient appropriate because:   ?  Amputation versus operative management per [name] Requires C. difficile treatment   Subjective:  Awake coherent no distress looks comfortable Less stool overnight only 2 episodes and only 1 today No fever Abdominal pain is improved    Objective: Vitals:   10/23/22 0249 10/23/22 0615 10/23/22 0809 10/23/22 1058  BP:  (!) 148/49 (!) 140/53 (!) 112/42  Pulse:  79 83 77  Resp:   16   Temp:  98.3 F (36.8 C) 98 F (36.7 C) 98.4 F (36.9 C)  TempSrc:  Oral Oral   SpO2:  96% 93% 97%  Weight: 107.3 kg     Height:        Intake/Output Summary (Last 24 hours) at 10/23/2022 1126 Last data  filed at 10/23/2022 0900 Gross per 24 hour  Intake 333.6 ml  Output 3000 ml  Net -2666.4 ml    Filed Weights   10/21/22 0855 10/22/22 0612 10/23/22 0249  Weight: 115.5 kg 112.2 kg 107.3 kg    Examination:  Coherent black  female no distress Chest clear no rales rhonchi Rash over most of the body Left-sided AV fistula noted Abdomen obese slightly tender but improved since yesterday with less swelling No lower extremity edema Neurologically intact moving 4 limbs equally Examined on left foot looks clean with no pus purulence  Data Reviewed: personally reviewed   CBC    Component Value Date/Time   WBC 6.5 10/23/2022 0715   RBC 4.63 10/23/2022 0715   HGB 12.9 10/23/2022 0715   HGB 9.7 (L) 09/27/2020 1417   HCT 40.5 10/23/2022 0715   HCT 28.9 (L) 09/27/2020 1417   PLT 159 10/23/2022 0715   PLT 209 09/27/2020 1417   MCV 87.5 10/23/2022 0715   MCV 89 09/27/2020 1417   MCH 27.9 10/23/2022 0715   MCHC 31.9 10/23/2022 0715   RDW 17.2 (H) 10/23/2022 0715   RDW 14.5 09/27/2020 1417   LYMPHSABS 1.5 05/26/2022 1100   LYMPHSABS 1.5 09/27/2020 1417   MONOABS 0.6 05/26/2022 1100   EOSABS 0.3 05/26/2022 1100   EOSABS 0.2 09/27/2020 1417   BASOSABS 0.1 05/26/2022 1100   BASOSABS 0.0 09/27/2020 1417      Latest Ref Rng & Units 10/23/2022    7:15 AM 10/22/2022    2:59 AM 10/21/2022    3:29 AM  CMP  Glucose 70 - 99 mg/dL 010  78  272   BUN 8 - 23 mg/dL 19  34  66   Creatinine 0.44 - 1.00 mg/dL 5.36  6.44  03.47   Sodium 135 - 145 mmol/L 134  135  136   Potassium 3.5 - 5.1 mmol/L 3.5  4.0  4.9   Chloride 98 - 111 mmol/L 94  94  98   CO2 22 - 32 mmol/L 25  22  24    Calcium 8.9 - 10.3 mg/dL 8.6  8.5  8.2   Total Protein 6.5 - 8.1 g/dL   6.3   Total Bilirubin 0.3 - 1.2 mg/dL   0.6   Alkaline Phos 38 - 126 U/L   233   AST 15 - 41 U/L   10   ALT 0 - 44 U/L   15      Radiology Studies: VAS [place] ABI WITH/WO TBI  Result Date: 10/22/2022  LOWER EXTREMITY DOPPLER STUDY Patient Name:  [name]  Date of Exam:   10/22/2022 Medical Rec #: [id number]      Accession #:    [id number] Date of Birth: 1956/05/07      Patient Gender: F Patient Age:   67 years Exam Location:  [facility] Procedure:      VAS [place]  ABI WITH/WO TBI Referring Phys: [name] --------------------------------------------------------------------------------  Indications: Peripheral artery disease. Left great toe amputation with wound              dehiscence High Risk Factors: Diabetes, past history of smoking, coronary artery disease.  Limitations: Today's exam was limited due to Left dialysis access, left great              toe amputation. Comparison Study: No prior study on file Performing Technologist: Sherren Kerns RVS  Examination Guidelines: A complete evaluation includes at minimum, Doppler waveform signals and systolic blood  pressure reading at the level of bilateral brachial, anterior tibial, and posterior tibial arteries, when vessel segments are accessible. Bilateral testing is considered an integral part of a complete examination. Photoelectric Plethysmograph (PPG) waveforms and toe systolic pressure readings are included as required and additional duplex testing as needed. Limited examinations for reoccurring indications may be performed as noted.  ABI Findings: +---------+------------------+-----+-----------+--------+ Right    Rt Pressure (mmHg)IndexWaveform   Comment  +---------+------------------+-----+-----------+--------+ Brachial 150                    triphasic           +---------+------------------+-----+-----------+--------+ PTA      187               1.25 multiphasic         +---------+------------------+-----+-----------+--------+ DP       170               1.13 multiphasic         +---------+------------------+-----+-----------+--------+ Great Toe149               0.99 Normal              +---------+------------------+-----+-----------+--------+ +---------+------------------+-----+-----------+-----------------------+ Left     Lt Pressure (mmHg)IndexWaveform   Comment                 +---------+------------------+-----+-----------+-----------------------+ Brachial                                    restricted              +---------+------------------+-----+-----------+-----------------------+ PTA      184               1.23 multiphasic                        +---------+------------------+-----+-----------+-----------------------+ DP       165               1.10 multiphasic                        +---------+------------------+-----+-----------+-----------------------+ Great Toe                                  3rd toe normal waveform +---------+------------------+-----+-----------+-----------------------+ +-------+-----------+-----------------------+------------+------------+ ABI/TBIToday's ABIToday's TBI            Previous ABIPrevious TBI +-------+-----------+-----------------------+------------+------------+ Right  1.25       0.99                                            +-------+-----------+-----------------------+------------+------------+ Left   1.23       normal waveform 3rd toe                         +-------+-----------+-----------------------+------------+------------+   Summary: Right: Resting right ankle-brachial index is within normal range. The right toe-brachial index is normal. Left: Resting left ankle-brachial index is within normal range. *See table(s) above for measurements and observations.  Electronically signed by Heath Lark on 10/22/2022 at 4:52:29 PM.    Final    MR FOOT LEFT WO CONTRAST  Result Date: 10/22/2022 CLINICAL DATA:  Osteomyelitis, foot EXAM: MRI  OF THE LEFT FOOT WITHOUT CONTRAST TECHNIQUE: Multiplanar, multisequence MR imaging of the left forefoot was performed. No intravenous contrast was administered. COMPARISON:  Left radiograph 10/30/2022 FINDINGS: Bones/Joint/Cartilage Postsurgical changes of great toe amputation. There is marrow edema in the residual first metatarsal head mildly low T1 marrow signal marrow edema and low T1 signal of the medial sesamoid. There is mild marrow edema within the second toe  proximal phalanx and the second metatarsal head, with preserved T1 marrow signal. Minimal second MTP joint fluid. No other significant marrow signal alteration. Ligaments Intact Lisfranc ligament. Muscles and Tendons Chronic denervation changes. Postsurgical changes of the great toe tendons. Soft tissues Diffuse soft tissue swelling of the foot. Medial forefoot soft tissue wound which extends to the surface of the residual first metatarsal head. Small amount of coalescent fluid underlying the ulcer and along the surface of the first metatarsal head measuring 1.0 x 0.6 x 0.5 cm (series 6, image 36, series 9, image 8). IMPRESSION: Medial forefoot soft tissue wound with underlying early osteomyelitis of the residual first metatarsal head and medial sesamoid. Small amount of coalescent fluid underlying the ulcer and along the surface of the first metatarsal head measuring 1.0 x 0.6 x 0.5 cm, could be an abscess, correlate with drainage. Mild marrow edema within the second toe proximal phalanx and second metatarsal head with preserved T1 marrow signal, could reflect reactive marrow change or early osteomyelitis. Electronically Signed   By: Caprice Renshaw M.D.   On: 10/22/2022 07:51     Scheduled Meds:  (feeding supplement) PROSource Plus  30 mL Oral BID BM   Chlorhexidine Gluconate Cloth  6 each Topical Q0600   Chlorhexidine Gluconate Cloth  6 each Topical Q0600   Chlorhexidine Gluconate Cloth  6 each Topical Q0600   heparin  5,000 Units Subcutaneous Q8H   insulin aspart  0-6 Units Subcutaneous TID WC   insulin detemir  20 Units Subcutaneous Daily   kidney failure book   Does not apply Once   mupirocin ointment  1 Application Nasal BID   pantoprazole (PROTONIX) IV  40 mg Intravenous Q24H   pregabalin  25 mg Oral BID   vancomycin  125 mg Oral QID   Continuous Infusions:  sodium chloride 10 mL/hr at 10/22/22 0652   piperacillin-tazobactam (ZOSYN)  IV 2.25 g (10/23/22 0553)   vancomycin 1,000 mg (10/23/22  0457)     LOS: 3 days   Time spent: 26  Rhetta Mura, MD [facility] To contact the attending provider between 7A-7P or the covering provider during after hours 7P-7A, please log into the web site [email or web address] and access using universal [place] password for that web site. If you do not have the password, please call the hospital operator.  10/23/2022, 11:26 AM

## 2022-10-24 DIAGNOSIS — M86172 Other acute osteomyelitis, left ankle and foot: Secondary | ICD-10-CM | POA: Diagnosis not present

## 2022-10-24 DIAGNOSIS — M86272 Subacute osteomyelitis, left ankle and foot: Secondary | ICD-10-CM | POA: Diagnosis not present

## 2022-10-24 DIAGNOSIS — G9340 Encephalopathy, unspecified: Secondary | ICD-10-CM | POA: Diagnosis not present

## 2022-10-24 DIAGNOSIS — N186 End stage renal disease: Secondary | ICD-10-CM | POA: Diagnosis not present

## 2022-10-24 LAB — RENAL FUNCTION PANEL
Albumin: 2.7 g/dL — ABNORMAL LOW (ref 3.5–5.0)
Anion gap: 15 (ref 5–15)
BUN: 29 mg/dL — ABNORMAL HIGH (ref 8–23)
CO2: 27 mmol/L (ref 22–32)
Calcium: 8.7 mg/dL — ABNORMAL LOW (ref 8.9–10.3)
Chloride: 92 mmol/L — ABNORMAL LOW (ref 98–111)
Creatinine, Ser: 7.75 mg/dL — ABNORMAL HIGH (ref 0.44–1.00)
GFR, Estimated: 5 mL/min — ABNORMAL LOW (ref >60)
Glucose, Bld: 129 mg/dL — ABNORMAL HIGH (ref 70–99)
Phosphorus: 5 mg/dL — ABNORMAL HIGH (ref 2.5–4.6)
Potassium: 3.6 mmol/L (ref 3.5–5.1)
Sodium: 134 mmol/L — ABNORMAL LOW (ref 135–145)

## 2022-10-24 LAB — CBC
HCT: 41.1 % (ref 36.0–46.0)
Hemoglobin: 13 g/dL (ref 12.0–15.0)
MCH: 28 pg (ref 26.0–34.0)
MCHC: 31.6 g/dL (ref 30.0–36.0)
MCV: 88.6 fL (ref 80.0–100.0)
Platelets: 156 10*3/uL (ref 150–400)
RBC: 4.64 MIL/uL (ref 3.87–5.11)
RDW: 17 % — ABNORMAL HIGH (ref 11.5–15.5)
WBC: 6.1 10*3/uL (ref 4.0–10.5)
nRBC: 0 % (ref 0.0–0.2)

## 2022-10-24 LAB — GLUCOSE, CAPILLARY
Glucose-Capillary: 90 mg/dL (ref 70–99)
Glucose-Capillary: 147 mg/dL — ABNORMAL HIGH (ref 70–99)
Glucose-Capillary: 137 mg/dL — ABNORMAL HIGH (ref 70–99)
Glucose-Capillary: 151 mg/dL — ABNORMAL HIGH (ref 70–99)

## 2022-10-24 MED ORDER — CHLORHEXIDINE GLUCONATE CLOTH 2 % EX PADS
6.0000 | MEDICATED_PAD | Freq: Every day | CUTANEOUS | Status: DC
  Administered 2022-10-25 – 2022-10-31 (×5): 6 via TOPICAL

## 2022-10-24 MED ORDER — SODIUM CHLORIDE 0.9 % IV SOLN
2.0000 g | INTRAVENOUS | Status: AC
  Administered 2022-10-24 – 2022-10-29 (×6): 2 g via INTRAVENOUS
  Filled 2022-10-24 (×6): qty 20

## 2022-10-24 MED ORDER — HEPARIN SODIUM (PORCINE) 1000 UNIT/ML DIALYSIS
2000.0000 [IU] | Freq: Once | INTRAMUSCULAR | Status: AC
  Administered 2022-10-24: 2000 [IU] via INTRAVENOUS_CENTRAL
  Filled 2022-10-24: qty 2

## 2022-10-24 MED ORDER — PANTOPRAZOLE SODIUM 40 MG PO TBEC
40.0000 mg | DELAYED_RELEASE_TABLET | Freq: Every day | ORAL | Status: DC
  Administered 2022-10-24 – 2022-11-01 (×8): 40 mg via ORAL
  Filled 2022-10-24 (×8): qty 1

## 2022-10-24 MED ORDER — CHLORHEXIDINE GLUCONATE CLOTH 2 % EX PADS: 6.0000 | MEDICATED_PAD | Freq: Every day | CUTANEOUS | Status: DC

## 2022-10-24 NOTE — Progress Notes (Signed)
[facility] Progress Note   Subjective:   Still having some diarrhea and abdominal pain. No CP, SOB or dizziness. Appears plan is for amputation on Wednesday.   Objective Vitals:   10/24/22 0900 10/24/22 0930 10/24/22 1000 10/24/22 1030  BP: (!) 130/49 (!) 135/50 119/70 (!) 72/41  Pulse: 76 77 75 85  Resp: 20 12 14 10   Temp:      TempSrc:      SpO2: 98% 99% 99% 99%  Weight:      Height:       Physical Exam General: Alert female in NAD Heart: RRR, no murmurs, rubs or gallops Lungs: CTA bilaterally Abdomen: +BS, palpation deferred  Extremities: No edema b/l lower extremities, L foot with bandage Dialysis Access:  LUE AVF + bruit  Additional Objective Labs: Basic Metabolic Panel: Recent Labs  Lab 10/22/22 0259 10/23/22 0715 10/24/22 0307  NA 135 134* 134*  K 4.0 3.5 3.6  CL 94* 94* 92*  CO2 22 25 27   GLUCOSE 78 111* 129*  BUN 34* 19 29*  CREATININE 8.44* 5.61* 7.75*  CALCIUM 8.5* 8.6* 8.7*  PHOS 5.6* 3.6 5.0*   Liver Function Tests: Recent Labs  Lab 10/20/22 1435 10/21/22 0329 10/22/22 0259 10/23/22 0715 10/24/22 0307  AST 15 10*  --   --   --   ALT 18 15  --   --   --   ALKPHOS 273* 233*  --   --   --   BILITOT 0.4 0.6  --   --   --   PROT 7.0 6.3*  --   --   --   ALBUMIN 3.5 3.0* 2.8* 2.8* 2.7*   No results for input(s): "LIPASE", "AMYLASE" in the last 168 hours. CBC: Recent Labs  Lab 10/20/22 1435 10/20/22 1544 10/21/22 0329 10/22/22 0259 10/23/22 0715 10/24/22 0307  WBC 9.2  --  8.4 8.9 6.5 6.1  HGB 12.7   < > 12.0 12.4 12.9 13.0  HCT 41.0   < > 38.4 40.3 40.5 41.1  MCV 90.1  --  88.7 90.6 87.5 88.6  PLT 173  --  155 135* 159 156   < > = values in this interval not displayed.   Blood Culture    Component Value Date/Time   SDES BLOOD RIGHT ARM 10/20/2022 1538   SPECREQUEST  10/20/2022 1538    BOTTLES DRAWN AEROBIC AND ANAEROBIC Blood Culture adequate volume   CULT  10/20/2022 1538    NO GROWTH 4 DAYS Performed at  [facility], [address] [place], [place] [address]    REPTSTATUS PENDING 10/20/2022 1538    Cardiac Enzymes: No results for input(s): "CKTOTAL", "CKMB", "CKMBINDEX", "TROPONINI" in the last 168 hours. CBG: Recent Labs  Lab 10/23/22 0731 10/23/22 1049 10/23/22 1652 10/23/22 2117 10/24/22 0733  GLUCAP 101* 162* 133* 156* 90   Iron Studies: No results for input(s): "IRON", "TIBC", "TRANSFERRIN", "FERRITIN" in the last 72 hours. @lablastinr3 @ Studies/Results: VAS [place] ABI WITH/WO TBI  Result Date: 10/22/2022  LOWER EXTREMITY DOPPLER STUDY Patient Name:  [name]  Date of Exam:   10/22/2022 Medical Rec #: [id number]      Accession #:    [id number] Date of Birth: 1956-03-02      Patient Gender: F Patient Age:   67 years Exam Location:  [facility] Procedure:      VAS [place] ABI WITH/WO TBI Referring Phys: MARCUS DUDA --------------------------------------------------------------------------------  Indications: Peripheral artery disease. Left great toe  amputation with wound              dehiscence High Risk Factors: Diabetes, past history of smoking, coronary artery disease.  Limitations: Today's exam was limited due to Left dialysis access, left great              toe amputation. Comparison Study: No prior study on file Performing Technologist: Sherren Kerns RVS  Examination Guidelines: A complete evaluation includes at minimum, Doppler waveform signals and systolic blood pressure reading at the level of bilateral brachial, anterior tibial, and posterior tibial arteries, when vessel segments are accessible. Bilateral testing is considered an integral part of a complete examination. Photoelectric Plethysmograph (PPG) waveforms and toe systolic pressure readings are included as required and additional duplex testing as needed. Limited examinations for reoccurring indications may be performed as noted.  ABI Findings: +---------+------------------+-----+-----------+--------+ Right     Rt Pressure (mmHg)IndexWaveform   Comment  +---------+------------------+-----+-----------+--------+ Brachial 150                    triphasic           +---------+------------------+-----+-----------+--------+ PTA      187               1.25 multiphasic         +---------+------------------+-----+-----------+--------+ DP       170               1.13 multiphasic         +---------+------------------+-----+-----------+--------+ Great Toe149               0.99 Normal              +---------+------------------+-----+-----------+--------+ +---------+------------------+-----+-----------+-----------------------+ Left     Lt Pressure (mmHg)IndexWaveform   Comment                 +---------+------------------+-----+-----------+-----------------------+ Brachial                                   restricted              +---------+------------------+-----+-----------+-----------------------+ PTA      184               1.23 multiphasic                        +---------+------------------+-----+-----------+-----------------------+ DP       165               1.10 multiphasic                        +---------+------------------+-----+-----------+-----------------------+ Great Toe                                  3rd toe normal waveform +---------+------------------+-----+-----------+-----------------------+ +-------+-----------+-----------------------+------------+------------+ ABI/TBIToday's ABIToday's TBI            Previous ABIPrevious TBI +-------+-----------+-----------------------+------------+------------+ Right  1.25       0.99                                            +-------+-----------+-----------------------+------------+------------+ Left   1.23       normal waveform 3rd toe                         +-------+-----------+-----------------------+------------+------------+  Summary: Right: Resting right ankle-brachial index is within normal  range. The right toe-brachial index is normal. Left: Resting left ankle-brachial index is within normal range. *See table(s) above for measurements and observations.  Electronically signed by Heath Lark on 10/22/2022 at 4:52:29 PM.    Final    Medications:  sodium chloride 10 mL/hr at 10/24/22 0611   cefTRIAXone (ROCEPHIN)  IV     vancomycin 1,000 mg (10/23/22 0457)    (feeding supplement) PROSource Plus  30 mL Oral BID BM   Chlorhexidine Gluconate Cloth  6 each Topical Daily   heparin  5,000 Units Subcutaneous Q8H   insulin aspart  0-6 Units Subcutaneous TID WC   insulin detemir  20 Units Subcutaneous Daily   mupirocin ointment  1 Application Nasal BID   pantoprazole  40 mg Oral Daily   pregabalin  25 mg Oral BID   vancomycin  125 mg Oral QID    Outpatient Dialysis Orders: TTS at [facility] -> last HD 4/4, cut time - post-weight 113.5kg 4:15hr, 450/A1.5, EDW 111.5kg, 2K/2Ca, LUE AVF, heparin 3000 unit bolus - Mircera IV q 2 weeks - appears to have missed last dose, last given 3/21 - Venofer 50mg  IV weekly - Hectoral IV q HD    Assessment/Plan:  AMS: ?Due to infection, doubt uremia. Head CT negative. Much improved today. 2.  L foot osteomyelitis: Non-healing wound s/p L great toe amputation 09/08/22 - purulent drainage, x-Janczak suspicious for early osteo. Blood Cx collected, started on Vanc/Zosyn. Purplish area on dorsal foot - ?early calciphylaxis v. other ischemic changes. Ortho has seen here, ABIs completed. Plan is for amputation this week.  3. ?Pancolitis: Per abdominal CT and tender across abdomen - per primary. 4.  ESRD: Had missed 2 HD prior to admit. Now back on track, continue TTS schedule 5.  Hypertension/volume: BP running high but improving, below her prior EDW. Will lower at dischage 6.  Anemia: Hgb > 12, no ESA needed for now. 7.  Metabolic bone disease: Ca ok, Phos controlled - resume home medications. Holding VDRA for now. 8.  Nutrition: Alb low, continue  supplements for wound healing.    Rogers Blocker, PA-C 10/24/2022, 10:48 AM  [facility] Pager: [phone]

## 2022-10-24 NOTE — Progress Notes (Signed)
Received patient in bed to unit.  Alert and oriented.  Informed consent signed and in chart.   TX duration:3.5  Patient tolerated well.  Transported back to the room  Alert, without acute distress.  Hand-off given to patient's nurse.   Access used: LEFT AVF Access issues: NONE  Total UF removed: 2.5L Medication(s) given: DILUADID, VANCOMYCIN   10/24/22 1230  Vitals  Temp 97.7 F (36.5 C)  Temp Source Oral  BP (!) 123/59  MAP (mmHg) 76  BP Location Right Arm  BP Method Automatic  Patient Position (if appropriate) Lying  Pulse Rate 75  Pulse Rate Source Monitor  ECG Heart Rate 74  Resp (!) 9  Oxygen Therapy  SpO2 99 %  O2 Device Room Air  During Treatment Monitoring  HD Safety Checks Performed Yes  Intra-Hemodialysis Comments Tx completed  Dialysis Fluid Bolus Normal Saline  Bolus Amount (mL) 300 mL  Post Treatment  Dialyzer Clearance Lightly streaked  Duration of HD Treatment -hour(s) 3.5 hour(s)  Liters Processed 73.3  Fluid Removed (mL) 2500 mL  Tolerated HD Treatment Yes  AVG/AVF Arterial Site Held (minutes) 7 minutes  AVG/AVF Venous Site Held (minutes) 7 minutes      Ralf Konopka S [facility]

## 2022-10-24 NOTE — Progress Notes (Signed)
Patient ID: [name], female   DOB: 11/11/1955, 66 y.o.   MRN: [id number] Patient is currently in dialysis.  The MRI scan shows osteomyelitis of the left first metatarsal.  ABIs shows normal multiphasic flow at the ankle.  Discussed with the patient and her daughter on the phone recommendation to proceed with a left foot first Hammad amputation risk and benefits were discussed they state they understand wish to proceed at this time plan for surgery on Wednesday. 

## 2022-10-24 NOTE — H&P (View-Only) (Signed)
Patient ID: [name], female   DOB: 02/28/1956, 67 y.o.   MRN: [id number] Patient is currently in dialysis.  The MRI scan shows osteomyelitis of the left first metatarsal.  ABIs shows normal multiphasic flow at the ankle.  Discussed with the patient and her daughter on the phone recommendation to proceed with a left foot first Bess amputation risk and benefits were discussed they state they understand wish to proceed at this time plan for surgery on Wednesday.

## 2022-10-24 NOTE — Plan of Care (Signed)

## 2022-10-24 NOTE — Progress Notes (Signed)
Called to get nursing report from [place], [place], she wasn't available to get report.

## 2022-10-24 NOTE — Progress Notes (Signed)
PROGRESS NOTE   [name]  OVA:[id number] DOB: 12-08-1955 DOA: 10/20/2022 PCP: Felix Pacini, FNP  Brief Narrative:  67 year old black female known ESRD HD TTS DM TY 2 HFpEF EF 60-65% 09/11/2021-CAD 09/2016 NSTEMI Hep C unclear if treated Gout History of colostomy status post takedown 11/2021 for necrotizing fasciitis of abdomen--EGD colonoscopy 07/2022 unremarkable Chronic right hip pain-initial concerns of avascular necrosis have not been confirmed in the past  Patient had great toe amputation with Dr. Kandis Cocking and grew out Pseudomonas Prevotella and coagulase-negative staph and was told to complete 2 weeks of Cipro and Flagyl stop date 3/15  09/29/2022 visited orthopedics Dr. Emmit [facility] recently with [name] for bilateral osteoarthritis of knees and was recommended pain management referral versus lubrication injection  Admitted 10/20/2022 altered mental status Sodium 134 BUNs/creatinine 61/13 (this is baseline) Alk phos 233 albumin 3 AST/ALT normal WBC 9.2 platelets 173--- ammonia was 17  CT abdomen chest pelvis = pancolitis nodular liver bilateral renal cysts hypodensities (does not recommend any follow-up imaging) CT head and CT cervical spine negative for any abnormality Foot x-Galvao interval to great toe amputation with overlying soft tissue wound questionable subtle margin erosion of first metatarsal head?  Early osteomyelitis minimally displaced intra-articular fracture base of second proximal toe  Started on Vanco and Zosyn,  4/11 MRI foot suggestive of early osteomyelitis--- because of continued diarrhea found to be C. difficile positive on testing and started on oral Vanco 4/12 ABI shows resting R ABI within normal range, left ABI within normal range  Hospital-Problem based course  C. difficile colitis Retrospectively was recently on Cipro Flagyl until 3/15 so this was a risk factor Oral vancomycin started 4/11-symptoms have improved-expect to  treat for 14 days over above IV antibiotic duration for osteomyelitis  Osteomyelitis first metatarsal head Much appreciated Dr. Lajoyce Corners input-planning on surgery amputation 4/17 for--Displaced intra-articular second proximal toe fracture  hold aspirin for now Continue on vancomycin and Zosyn and may be able to de-escalate to cephalosporin per pharmacy-I will ask them  Metabolic encephalopathy likely 2/2 C. difficile Resume Lyrica at 25 twice daily--she is on oxycodone 10 every 6 and I will replace that with low-dose 5 every 6 to prevent withdrawal Would keep at lower doses of pain meds until surgery and then can reevaluate  ESRD [facility] TTS via LUE AVF Missed several sessions 4/6 and 10/20/2022 Defer to renal--hypertensive --hold midodrine 10 daily Watch phosphorus  Prior necrotizing fasciitis of the abdomen with colostomy takedown will continue broad-spectrum antibiotics for now  DM TY 2 with complication of in-hospital hypoglycemia  CBGs improved 90-156 Home MAR was incorrect-we did cut back back Lantus to 20 units   DVT prophylaxis: Heparin Code Status: Full Family Communication: No family present today Disposition:  Status is: Inpatient Remains inpatient appropriate because:   Amputate planned for 4/17  Subjective:  4 stools total yesterday some abdominal pain Foot seems about the same She is much more coherent and alert when I saw her on HD Dr. Lajoyce Corners was talking with her family on the phone after discussing the plan with her at the bedside   Objective: Vitals:   10/24/22 0830 10/24/22 0833 10/24/22 0900 10/24/22 0930  BP: (!) 132/44 (!) 132/44 (!) 130/49 (!) 135/50  Pulse: 76 74 76 77  Resp: (!) 9 (!) 9 20 12   Temp:      TempSrc:      SpO2: 98% 98% 98% 99%  Weight:      Height:  Intake/Output Summary (Last 24 hours) at 10/24/2022 0947 Last data filed at 10/24/2022 0800 Gross per 24 hour  Intake 660 ml  Output --  Net 660 ml    Filed Weights   10/22/22  0612 10/23/22 0249 10/24/22 0817  Weight: 112.2 kg 107.3 kg 111.4 kg    Examination:  Pleasant coherent thick neck Chest is clear S1-S2 no murmur ROM intact to power Abdomen is soft no rebound no guarding Neurologically is intact but exam was circumscribed as she was on the dialysis machine I did not examine her wound today  Data Reviewed: personally reviewed   CBC    Component Value Date/Time   WBC 6.1 10/24/2022 0307   RBC 4.64 10/24/2022 0307   HGB 13.0 10/24/2022 0307   HGB 9.7 (L) 09/27/2020 1417   HCT 41.1 10/24/2022 0307   HCT 28.9 (L) 09/27/2020 1417   PLT 156 10/24/2022 0307   PLT 209 09/27/2020 1417   MCV 88.6 10/24/2022 0307   MCV 89 09/27/2020 1417   MCH 28.0 10/24/2022 0307   MCHC 31.6 10/24/2022 0307   RDW 17.0 (H) 10/24/2022 0307   RDW 14.5 09/27/2020 1417   LYMPHSABS 1.5 05/26/2022 1100   LYMPHSABS 1.5 09/27/2020 1417   MONOABS 0.6 05/26/2022 1100   EOSABS 0.3 05/26/2022 1100   EOSABS 0.2 09/27/2020 1417   BASOSABS 0.1 05/26/2022 1100   BASOSABS 0.0 09/27/2020 1417      Latest Ref Rng & Units 10/24/2022    3:07 AM 10/23/2022    7:15 AM 10/22/2022    2:59 AM  CMP  Glucose 70 - 99 mg/dL 883  254  78   BUN 8 - 23 mg/dL 29  19  34   Creatinine 0.44 - 1.00 mg/dL 9.82  6.41  5.83   Sodium 135 - 145 mmol/L 134  134  135   Potassium 3.5 - 5.1 mmol/L 3.6  3.5  4.0   Chloride 98 - 111 mmol/L 92  94  94   CO2 22 - 32 mmol/L 27  25  22    Calcium 8.9 - 10.3 mg/dL 8.7  8.6  8.5      Radiology Studies: VAS [place] ABI WITH/WO TBI  Result Date: 10/22/2022  LOWER EXTREMITY DOPPLER STUDY Patient Name:  [name]  Date of Exam:   10/22/2022 Medical Rec #: [id number]      Accession #:    [id number] Date of Birth: [birth year]      Patient Gender: F Patient Age:   31 years Exam Location:  [facility] Procedure:      VAS [place] ABI WITH/WO TBI Referring Phys: MARCUS DUDA --------------------------------------------------------------------------------  Indications:  Peripheral artery disease. Left great toe amputation with wound              dehiscence High Risk Factors: Diabetes, past history of smoking, coronary artery disease.  Limitations: Today's exam was limited due to Left dialysis access, left great              toe amputation. Comparison Study: No prior study on file Performing Technologist: Sherren Kerns RVS  Examination Guidelines: A complete evaluation includes at minimum, Doppler waveform signals and systolic blood pressure reading at the level of bilateral brachial, anterior tibial, and posterior tibial arteries, when vessel segments are accessible. Bilateral testing is considered an integral part of a complete examination. Photoelectric Plethysmograph (PPG) waveforms and toe systolic pressure readings are included as required and additional duplex testing as needed. Limited examinations for reoccurring  indications may be performed as noted.  ABI Findings: +---------+------------------+-----+-----------+--------+ Right    Rt Pressure (mmHg)IndexWaveform   Comment  +---------+------------------+-----+-----------+--------+ Brachial 150                    triphasic           +---------+------------------+-----+-----------+--------+ PTA      187               1.25 multiphasic         +---------+------------------+-----+-----------+--------+ DP       170               1.13 multiphasic         +---------+------------------+-----+-----------+--------+ Great Toe149               0.99 Normal              +---------+------------------+-----+-----------+--------+ +---------+------------------+-----+-----------+-----------------------+ Left     Lt Pressure (mmHg)IndexWaveform   Comment                 +---------+------------------+-----+-----------+-----------------------+ Brachial                                   restricted              +---------+------------------+-----+-----------+-----------------------+ PTA      184                1.23 multiphasic                        +---------+------------------+-----+-----------+-----------------------+ DP       165               1.10 multiphasic                        +---------+------------------+-----+-----------+-----------------------+ Great Toe                                  3rd toe normal waveform +---------+------------------+-----+-----------+-----------------------+ +-------+-----------+-----------------------+------------+------------+ ABI/TBIToday's ABIToday's TBI            Previous ABIPrevious TBI +-------+-----------+-----------------------+------------+------------+ Right  1.25       0.99                                            +-------+-----------+-----------------------+------------+------------+ Left   1.23       normal waveform 3rd toe                         +-------+-----------+-----------------------+------------+------------+   Summary: Right: Resting right ankle-brachial index is within normal range. The right toe-brachial index is normal. Left: Resting left ankle-brachial index is within normal range. *See table(s) above for measurements and observations.  Electronically signed by Heath Lark on 10/22/2022 at 4:52:29 PM.    Final      Scheduled Meds:  (feeding supplement) PROSource Plus  30 mL Oral BID BM   Chlorhexidine Gluconate Cloth  6 each Topical Daily   heparin  5,000 Units Subcutaneous Q8H   insulin aspart  0-6 Units Subcutaneous TID WC   insulin detemir  20 Units Subcutaneous Daily   mupirocin ointment  1 Application Nasal BID   pantoprazole  40 mg Oral Daily  pregabalin  25 mg Oral BID   vancomycin  125 mg Oral QID   Continuous Infusions:  sodium chloride 10 mL/hr at 10/24/22 0611   piperacillin-tazobactam (ZOSYN)  IV 2.25 g (10/24/22 1610)   vancomycin 1,000 mg (10/23/22 0457)     LOS: 4 days   Time spent: 24  Rhetta Mura, MD [facility] To contact the attending provider between  7A-7P or the covering provider during after hours 7P-7A, please log into the web site [email or web address] and access using universal [place] password for that web site. If you do not have the password, please call the hospital operator.  10/24/2022, 9:47 AM

## 2022-10-24 NOTE — Plan of Care (Signed)
  Problem: Clinical Measurements: Goal: Respiratory complications will improve Outcome: Progressing Goal: Cardiovascular complication will be avoided Outcome: Progressing   Problem: Nutrition: Goal: Adequate nutrition will be maintained Outcome: Progressing   Problem: Elimination: Goal: Will not experience complications related to bowel motility Outcome: Progressing   

## 2022-10-25 DIAGNOSIS — G9340 Encephalopathy, unspecified: Secondary | ICD-10-CM | POA: Diagnosis not present

## 2022-10-25 LAB — CULTURE, BLOOD (ROUTINE X 2)
Culture: NO GROWTH
Special Requests: ADEQUATE

## 2022-10-25 LAB — GLUCOSE, CAPILLARY
Glucose-Capillary: 99 mg/dL (ref 70–99)
Glucose-Capillary: 137 mg/dL — ABNORMAL HIGH (ref 70–99)
Glucose-Capillary: 166 mg/dL — ABNORMAL HIGH (ref 70–99)
Glucose-Capillary: 177 mg/dL — ABNORMAL HIGH (ref 70–99)

## 2022-10-25 MED ORDER — HYDROMORPHONE HCL 2 MG PO TABS
1.0000 mg | ORAL_TABLET | Freq: Four times a day (QID) | ORAL | Status: DC | PRN
  Administered 2022-10-25 – 2022-10-29 (×8): 1 mg via ORAL
  Filled 2022-10-25 (×9): qty 1

## 2022-10-25 MED ORDER — NEPRO/CARBSTEADY PO LIQD: 237.0000 mL | Freq: Two times a day (BID) | ORAL | Status: DC

## 2022-10-25 MED ORDER — NEPRO/CARBSTEADY PO LIQD
237.0000 mL | ORAL | Status: DC
  Administered 2022-10-25 – 2022-10-31 (×6): 237 mL via ORAL

## 2022-10-25 NOTE — Progress Notes (Addendum)
[facility] Progress Note   Subjective:   Diarrhea improving. Denies SOB, CP, dizziness, nausea.   Objective Vitals:   10/24/22 1622 10/24/22 2039 10/25/22 0447 10/25/22 0850  BP: 133/64 (!) 142/60 (!) 150/57 (!) 151/55  Pulse: 83 84 77 74  Resp: 18 18 19 17   Temp: 98 F (36.7 C) 97.9 F (36.6 C) 98.5 F (36.9 C) 97.7 F (36.5 C)  TempSrc:  Oral Oral Oral  SpO2: 99% 100% 99% 100%  Weight:      Height:       Physical Exam General: Alert female in NAD Heart: RRR, no murmurs, rubs or gallops Lungs: CTA bilaterally Abdomen: +BS, non-distended Extremities: No edema b/l lower extremities, L foot with bandage Dialysis Access:  LUE AVF + bruit  Additional Objective Labs: Basic Metabolic Panel: Recent Labs  Lab 10/22/22 0259 10/23/22 0715 10/24/22 0307  NA 135 134* 134*  K 4.0 3.5 3.6  CL 94* 94* 92*  CO2 22 25 27   GLUCOSE 78 111* 129*  BUN 34* 19 29*  CREATININE 8.44* 5.61* 7.75*  CALCIUM 8.5* 8.6* 8.7*  PHOS 5.6* 3.6 5.0*   Liver Function Tests: Recent Labs  Lab 10/20/22 1435 10/21/22 0329 10/22/22 0259 10/23/22 0715 10/24/22 0307  AST 15 10*  --   --   --   ALT 18 15  --   --   --   ALKPHOS 273* 233*  --   --   --   BILITOT 0.4 0.6  --   --   --   PROT 7.0 6.3*  --   --   --   ALBUMIN 3.5 3.0* 2.8* 2.8* 2.7*   No results for input(s): "LIPASE", "AMYLASE" in the last 168 hours. CBC: Recent Labs  Lab 10/20/22 1435 10/20/22 1544 10/21/22 0329 10/22/22 0259 10/23/22 0715 10/24/22 0307  WBC 9.2  --  8.4 8.9 6.5 6.1  HGB 12.7   < > 12.0 12.4 12.9 13.0  HCT 41.0   < > 38.4 40.3 40.5 41.1  MCV 90.1  --  88.7 90.6 87.5 88.6  PLT 173  --  155 135* 159 156   < > = values in this interval not displayed.   Blood Culture    Component Value Date/Time   SDES BLOOD RIGHT ARM 10/20/2022 1538   SPECREQUEST  10/20/2022 1538    BOTTLES DRAWN AEROBIC AND ANAEROBIC Blood Culture adequate volume   CULT  10/20/2022 1538    NO GROWTH 5  DAYS Performed at [facility], [address] [place], [place] [address]    REPTSTATUS 10/25/2022 FINAL 10/20/2022 1538    Cardiac Enzymes: No results for input(s): "CKTOTAL", "CKMB", "CKMBINDEX", "TROPONINI" in the last 168 hours. CBG: Recent Labs  Lab 10/24/22 0733 10/24/22 1308 10/24/22 1621 10/24/22 2041 10/25/22 0724  GLUCAP 90 147* 137* 151* 99   Iron Studies: No results for input(s): "IRON", "TIBC", "TRANSFERRIN", "FERRITIN" in the last 72 hours. @lablastinr3 @ Studies/Results: No results found. Medications:  sodium chloride 10 mL/hr at 10/24/22 0611   cefTRIAXone (ROCEPHIN)  IV 2 g (10/24/22 1452)   vancomycin 1,000 mg (10/24/22 1112)    (feeding supplement) PROSource Plus  30 mL Oral BID BM   Chlorhexidine Gluconate Cloth  6 each Topical Daily   heparin  5,000 Units Subcutaneous Q8H   insulin aspart  0-6 Units Subcutaneous TID WC   insulin detemir  20 Units Subcutaneous Daily   mupirocin ointment  1 Application Nasal BID   pantoprazole  40 mg Oral Daily   pregabalin  25 mg Oral BID   vancomycin  125 mg Oral QID    Outpatient Dialysis Orders: TTS at [facility] -> last HD 4/4, cut time - post-weight 113.5kg 4:15hr, 450/A1.5, EDW 111.5kg, 2K/2Ca, LUE AVF, heparin 3000 unit bolus - Mircera IV q 2 weeks - appears to have missed last dose, last given 3/21 - Venofer 50mg  IV weekly - Hectoral IV q HD    Assessment/Plan: AMS: ?Due to infection, doubt uremia. Head CT negative. Seems to be back to baseline 2.  L foot osteomyelitis: Non-healing wound s/p L great toe amputation 09/08/22 - purulent drainage, x-Ibrahim suspicious for early osteo. Blood Cx collected, started on Vanc/Zosyn. Purplish area on dorsal foot - ?early calciphylaxis v. other ischemic changes. Ortho has seen here, ABIs completed. Plan is for amputation this week.  3. CDI with colitis, improving on PO Vanc 4.  ESRD: Had missed 2 HD prior to admit. Now back on track, continue TTS schedule 5.   Hypertension/volume: BP running high but improving, below her prior EDW. Will lower at discharge 6.  Anemia: Hgb > 12, no ESA needed for now. 7.  Metabolic bone disease: Ca ok, Phos controlled - resume home medications. Holding VDRA for now. 8.  Nutrition: Alb low, continue supplements for wound healing.    Rogers Blocker, PA-C 10/25/2022, 9:26 AM  [facility] Pager: [phone]  I saw the patient and agree with the above assessment and plan.     Sabra Heck MD

## 2022-10-25 NOTE — Progress Notes (Signed)
PROGRESS NOTE   [name]  BTD:[id number] DOB: Oct 29, 1955 DOA: 10/20/2022 PCP: Felix Pacini, FNP  Brief Narrative:  67 year old black female known ESRD HD TTS DM TY 2 HFpEF EF 60-65% 09/11/2021-CAD 09/2016 NSTEMI Hep C unclear if treated Gout History of colostomy status post takedown 11/2021 for necrotizing fasciitis of abdomen--EGD colonoscopy 07/2022 unremarkable Chronic right hip pain-initial concerns of avascular necrosis have not been confirmed in the past  Patient had great toe amputation with Dr. Kandis Cocking and grew out Pseudomonas Prevotella and coagulase-negative staph and was told to complete 2 weeks of Cipro and Flagyl stop date 3/15  09/29/2022 visited orthopedics Dr. Emmit [facility] recently with [name] for bilateral osteoarthritis of knees and was recommended pain management referral versus lubrication injection  Admitted 10/20/2022 altered mental status Sodium 134 BUNs/creatinine 61/13 (this is baseline) Alk phos 233 albumin 3 AST/ALT normal WBC 9.2 platelets 173--- ammonia was 17  CT abdomen chest pelvis = pancolitis nodular liver bilateral renal cysts hypodensities (does not recommend any follow-up imaging) CT head and CT cervical spine negative for any abnormality Foot x-Gelb interval to great toe amputation with overlying soft tissue wound questionable subtle margin erosion of first metatarsal head?  Early osteomyelitis minimally displaced intra-articular fracture base of second proximal toe  Started on Vanco and Zosyn,  4/11 MRI foot suggestive of early osteomyelitis--- because of continued diarrhea found to be C. difficile positive on testing and started on oral Vanco 4/12 ABI shows resting R ABI within normal range, left ABI within normal range  Hospital-Problem based course  C. difficile colitis Retrospectively was recently on Cipro Flagyl until 3/15 so this was a risk factor Oral vancomycin started 4/11-symptoms have improved-treat for 14  days over above IV antibiotic duration for osteomyelitis  Osteomyelitis first metatarsal head Much appreciated Dr. Lajoyce Corners input-planning on surgery amputation 4/17 for--Displaced intra-articular second proximal toe fracture  hold aspirin for now Zosyn-->ceftriaxone 4/13, continue vancomycin IV additionally until amputation-see above  Metabolic encephalopathy likely 2/2 C. difficile Resume Lyrica at 25 twice daily--oxcodon changed to Dilaudid-space out to 1 mg every 6 as needed Attempt lower dosing of meds perioperatively as will need an increase in pain control closer to discharge and surgery  ESRD [facility] TTS via LUE AVF Missed several sessions 4/6 and 10/20/2022 --hypertensive --hold midodrine 10 daily Watch phosphorus with labs in the morning  Prior necrotizing fasciitis of the abdomen with colostomy takedown will continue broad-spectrum antibiotics for now  DM TY 2 with complication of in-hospital hypoglycemia-now eating 100% of meals CBGs are well-controlled in the 90-100 range Home MAR was incorrect-we did cut back back Lantus to 20 units   DVT prophylaxis: Heparin Code Status: Full Family Communication: No family present today Disposition:  Status is: Inpatient Remains inpatient appropriate because:   Amputate planned for 4/17  Subjective:  3 stool for the whole day yesterday --"mush" not watery not bloody no chest pain  Objective: Vitals:   10/24/22 1622 10/24/22 2039 10/25/22 0447 10/25/22 0850  BP: 133/64 (!) 142/60 (!) 150/57 (!) 151/55  Pulse: 83 84 77 74  Resp: 18 18 19 17   Temp: 98 F (36.7 C) 97.9 F (36.6 C) 98.5 F (36.9 C) 97.7 F (36.5 C)  TempSrc:  Oral Oral Oral  SpO2: 99% 100% 99% 100%  Weight:      Height:        Intake/Output Summary (Last 24 hours) at 10/25/2022 1058 Last data filed at 10/25/2022 1019 Gross per 24 hour  Intake 1380.98 ml  Output 2500 ml  Net -1119.02 ml    Filed Weights   10/23/22 0249 10/24/22 0817 10/24/22 1249   Weight: 107.3 kg 111.4 kg 107.7 kg    Examination:  Pleasant coherent thick neck Chest is clear S1-S2 no murmur-fistula in left upper arm with some scarring Scaly rash all over body which has been present for a long time ROM intact to power Abdomen no rebound no guarding less tender overall Wound examined 4/14 and looks stable  Data Reviewed: personally reviewed   CBC    Component Value Date/Time   WBC 6.1 10/24/2022 0307   RBC 4.64 10/24/2022 0307   HGB 13.0 10/24/2022 0307   HGB 9.7 (L) 09/27/2020 1417   HCT 41.1 10/24/2022 0307   HCT 28.9 (L) 09/27/2020 1417   PLT 156 10/24/2022 0307   PLT 209 09/27/2020 1417   MCV 88.6 10/24/2022 0307   MCV 89 09/27/2020 1417   MCH 28.0 10/24/2022 0307   MCHC 31.6 10/24/2022 0307   RDW 17.0 (H) 10/24/2022 0307   RDW 14.5 09/27/2020 1417   LYMPHSABS 1.5 05/26/2022 1100   LYMPHSABS 1.5 09/27/2020 1417   MONOABS 0.6 05/26/2022 1100   EOSABS 0.3 05/26/2022 1100   EOSABS 0.2 09/27/2020 1417   BASOSABS 0.1 05/26/2022 1100   BASOSABS 0.0 09/27/2020 1417      Latest Ref Rng & Units 10/24/2022    3:07 AM 10/23/2022    7:15 AM 10/22/2022    2:59 AM  CMP  Glucose 70 - 99 mg/dL 732  202  78   BUN 8 - 23 mg/dL 29  19  34   Creatinine 0.44 - 1.00 mg/dL 5.42  7.06  2.37   Sodium 135 - 145 mmol/L 134  134  135   Potassium 3.5 - 5.1 mmol/L 3.6  3.5  4.0   Chloride 98 - 111 mmol/L 92  94  94   CO2 22 - 32 mmol/L 27  25  22    Calcium 8.9 - 10.3 mg/dL 8.7  8.6  8.5      Radiology Studies: No results found.   Scheduled Meds:  (feeding supplement) PROSource Plus  30 mL Oral BID BM   Chlorhexidine Gluconate Cloth  6 each Topical Daily   heparin  5,000 Units Subcutaneous Q8H   insulin aspart  0-6 Units Subcutaneous TID WC   insulin detemir  20 Units Subcutaneous Daily   mupirocin ointment  1 Application Nasal BID   pantoprazole  40 mg Oral Daily   pregabalin  25 mg Oral BID   vancomycin  125 mg Oral QID   Continuous Infusions:   sodium chloride 10 mL/hr at 10/24/22 0611   cefTRIAXone (ROCEPHIN)  IV 2 g (10/24/22 1452)   vancomycin 1,000 mg (10/24/22 1112)     LOS: 5 days   Time spent: 59  Rhetta Mura, MD [facility] To contact the attending provider between 7A-7P or the covering provider during after hours 7P-7A, please log into the web site [email or web address] and access using universal [place] password for that web site. If you do not have the password, please call the hospital operator.  10/25/2022, 10:58 AM

## 2022-10-25 NOTE — Progress Notes (Signed)
Initial Nutrition Assessment RD working remotely.   DOCUMENTATION CODES:   Obesity unspecified  INTERVENTION:  - ordered Nepro Shake po once/day, each supplement provides 425 kcal and 19 grams protein.  - continue 30 ml Prosource Plus BID, each supplement provides 100 kcal and 15 grams protein.   - communicated with RN via secure chat about Calorie Count.  - RD to follow-up 4/15 with results for day #1 of Calorie Count.   - complete NFPE when feasible.  - RD to provide in person diet education.   NUTRITION DIAGNOSIS:   Increased nutrient needs related to acute illness, chronic illness as evidenced by estimated needs.  GOAL:   Patient will meet greater than or equal to 90% of their needs  MONITOR:   PO intake, Supplement acceptance, Labs, Weight trends, Skin  REASON FOR ASSESSMENT:   Consult Assessment of nutrition requirement/status, Calorie Count, Diet education  ASSESSMENT:   67 year-old female with medical history of type 2 DM, HTN, hepatitis C, pancreatitis, asthma, CAD, CHF, aortic stenosis, gout, GERD, ESRD on HD, anemia, arthritis, depression, MI, and heart murmur. Patient also with hx of colostomy s/p takedown in 11/2021 due to necrotizing fasciitis of abdomen. She presented to the ED due to AMS. CT abdomen/pelvis and chest indicated pancolitis, nodular liver, bilateral renal cysts. CT head and CT cervical spine without acute findings.  Patient ate 80% of dinner on 4/12, 55% of breakfast and 100% of lunch and dinner on 4/13, and 100% of breakfast this morning. During secure chat conversation with RN, she shares that patient eats well depending on what is served. She shares that patient informed Tech that earlier in admission she was not wanting to eat because of cdiff and each time she ate it went right through her and was painful. Patient also with AMS on admission and RN shares patient had refused to eat during the time of confusion.   Patient has not been  assessed by a [facility] RD since 01/31/20. Weight yesterday was documented as 237 lb and 245.5 lb. Weight during admission has been documented as high as 255 lb and as low as 236.5 lb. Non-pitting edema to BLE documented in the edema section of flow sheet.    Labs reviewed; CBGs: 99 and 137 mg/dl, Na: 951 mmol/l, Cl: 92 mmol/l, BUN: 29 mg/dl, creatinine: 8.84 mg/dl, Ca: 8.7 mg/dl, Phos: 5 mg/dl, GFR: 5 ml/min.  Medications reviewed; sliding scale novolog, 20 units levemir/day, 40 mg oral protonix/day.    NUTRITION - FOCUSED PHYSICAL EXAM:  RD working remotely.   Diet Order:   Diet Order             Diet renal/carb modified with fluid restriction Diet-HS Snack? Nothing; Fluid restriction: 1500 mL Fluid; Room service appropriate? Yes; Fluid consistency: Thin  Diet effective now                   EDUCATION NEEDS:   Not appropriate for education at this time  Skin:  Skin Assessment: Skin Integrity Issues: Skin Integrity Issues:: Incisions Incisions: L great toe amputation site (10/20/22)  Last BM:  4/14 (type 6 x1, medium amount)  Height:   Ht Readings from Last 1 Encounters:  10/20/22 5\' 5"  (1.651 m)    Weight:   Wt Readings from Last 1 Encounters:  10/24/22 107.7 kg     BMI:  Body mass index is 39.51 kg/m.  Estimated Nutritional Needs:  Kcal:  2100-2300 kcal Protein:  110-125 grams Fluid:  UOP +  1 L/day    Trenton Gammon, MS, RD, LDN, CNSC Clinical Dietitian PRN/Relief staff On-call/weekend pager # available in [facility]

## 2022-10-25 NOTE — Plan of Care (Signed)
  Problem: Nutritional: Goal: Maintenance of adequate nutrition will improve Outcome: Progressing   Problem: Clinical Measurements: Goal: Respiratory complications will improve Outcome: Progressing Goal: Cardiovascular complication will be avoided Outcome: Progressing   Problem: Activity: Goal: Risk for activity intolerance will decrease Outcome: Progressing   Problem: Nutrition: Goal: Adequate nutrition will be maintained Outcome: Progressing   Problem: Elimination: Goal: Will not experience complications related to bowel motility Outcome: Progressing   Problem: Pain Managment: Goal: General experience of comfort will improve Outcome: Progressing

## 2022-10-26 DIAGNOSIS — G9340 Encephalopathy, unspecified: Secondary | ICD-10-CM | POA: Diagnosis not present

## 2022-10-26 LAB — CBC WITH DIFFERENTIAL/PLATELET
Abs Immature Granulocytes: 0.05 10*3/uL (ref 0.00–0.07)
Basophils Absolute: 0 10*3/uL (ref 0.0–0.1)
Basophils Relative: 1 %
Eosinophils Absolute: 0.4 10*3/uL (ref 0.0–0.5)
Eosinophils Relative: 7 %
HCT: 40.7 % (ref 36.0–46.0)
Hemoglobin: 12.9 g/dL (ref 12.0–15.0)
Immature Granulocytes: 1 %
Lymphocytes Relative: 22 %
Lymphs Abs: 1.3 10*3/uL (ref 0.7–4.0)
MCH: 28 pg (ref 26.0–34.0)
MCHC: 31.7 g/dL (ref 30.0–36.0)
MCV: 88.5 fL (ref 80.0–100.0)
Monocytes Absolute: 0.7 10*3/uL (ref 0.1–1.0)
Monocytes Relative: 12 %
Neutro Abs: 3.4 10*3/uL (ref 1.7–7.7)
Neutrophils Relative %: 57 %
Platelets: 166 10*3/uL (ref 150–400)
RBC: 4.6 MIL/uL (ref 3.87–5.11)
RDW: 16.7 % — ABNORMAL HIGH (ref 11.5–15.5)
WBC: 6 10*3/uL (ref 4.0–10.5)
nRBC: 0 % (ref 0.0–0.2)

## 2022-10-26 LAB — RENAL FUNCTION PANEL
Albumin: 3.1 g/dL — ABNORMAL LOW (ref 3.5–5.0)
Anion gap: 15 (ref 5–15)
BUN: 32 mg/dL — ABNORMAL HIGH (ref 8–23)
CO2: 25 mmol/L (ref 22–32)
Calcium: 8.9 mg/dL (ref 8.9–10.3)
Chloride: 95 mmol/L — ABNORMAL LOW (ref 98–111)
Creatinine, Ser: 7.84 mg/dL — ABNORMAL HIGH (ref 0.44–1.00)
GFR, Estimated: 5 mL/min — ABNORMAL LOW (ref >60)
Glucose, Bld: 136 mg/dL — ABNORMAL HIGH (ref 70–99)
Phosphorus: 5.3 mg/dL — ABNORMAL HIGH (ref 2.5–4.6)
Potassium: 3.3 mmol/L — ABNORMAL LOW (ref 3.5–5.1)
Sodium: 135 mmol/L (ref 135–145)

## 2022-10-26 LAB — GLUCOSE, CAPILLARY
Glucose-Capillary: 127 mg/dL — ABNORMAL HIGH (ref 70–99)
Glucose-Capillary: 154 mg/dL — ABNORMAL HIGH (ref 70–99)
Glucose-Capillary: 175 mg/dL — ABNORMAL HIGH (ref 70–99)
Glucose-Capillary: 213 mg/dL — ABNORMAL HIGH (ref 70–99)

## 2022-10-26 MED ORDER — AMLODIPINE BESYLATE 5 MG PO TABS
5.0000 mg | ORAL_TABLET | Freq: Every day | ORAL | Status: DC
  Administered 2022-10-26 – 2022-11-01 (×5): 5 mg via ORAL
  Filled 2022-10-26 (×7): qty 1

## 2022-10-26 MED ORDER — POTASSIUM CHLORIDE CRYS ER 20 MEQ PO TBCR
40.0000 meq | EXTENDED_RELEASE_TABLET | Freq: Every day | ORAL | Status: DC
  Administered 2022-10-26 – 2022-10-29 (×4): 40 meq via ORAL
  Filled 2022-10-26 (×4): qty 2

## 2022-10-26 MED ORDER — SUCROFERRIC OXYHYDROXIDE 500 MG PO CHEW
500.0000 mg | CHEWABLE_TABLET | Freq: Three times a day (TID) | ORAL | Status: DC
  Administered 2022-10-26 – 2022-11-01 (×16): 500 mg via ORAL
  Filled 2022-10-26 (×16): qty 1

## 2022-10-26 NOTE — Progress Notes (Addendum)
[name] Progress Note   Subjective:  Seen in room - no major weekend issues. No CP/dyspnea. Sched for L 1st [place] amputation on Wed 4/17.  Objective Vitals:   10/25/22 0850 10/25/22 1628 10/25/22 2021 10/26/22 0337  BP: (!) 151/55 (!) 139/53 (!) 130/47 (!) 153/59  Pulse: 74 77 85 94  Resp: 17 18 18 18   Temp: 97.7 F (36.5 C) 98.5 F (36.9 C) 98.3 F (36.8 C) 98 F (36.7 C)  TempSrc: Oral Oral Oral Oral  SpO2: 100% 100% 100% 99%  Weight:      Height:       Physical Exam General: Well appearing woman, NAD. Room air. Heart: RRR; no murmur Lungs: CTAB Abdomen: soft Extremities: L foot not examined today - wearing socks; trace BLE edema Dialysis Access: LUE AVF + bruit  Additional Objective Labs: Basic Metabolic Panel: Recent Labs  Lab 10/23/22 0715 10/24/22 0307 10/26/22 0056  NA 134* 134* 135  K 3.5 3.6 3.3*  CL 94* 92* 95*  CO2 25 27 25   GLUCOSE 111* 129* 136*  BUN 19 29* 32*  CREATININE 5.61* 7.75* 7.84*  CALCIUM 8.6* 8.7* 8.9  PHOS 3.6 5.0* 5.3*   CBC: Recent Labs  Lab 10/21/22 0329 10/22/22 0259 10/23/22 0715 10/24/22 0307 10/26/22 0056  WBC 8.4 8.9 6.5 6.1 6.0  NEUTROABS  --   --   --   --  3.4  HGB 12.0 12.4 12.9 13.0 12.9  HCT 38.4 40.3 40.5 41.1 40.7  MCV 88.7 90.6 87.5 88.6 88.5  PLT 155 135* 159 156 166    Medications:  sodium chloride 10 mL/hr at 10/24/22 0611   cefTRIAXone (ROCEPHIN)  IV 2 g (10/25/22 1431)   vancomycin 1,000 mg (10/24/22 1112)    (feeding supplement) PROSource Plus  30 mL Oral BID BM   Chlorhexidine Gluconate Cloth  6 each Topical Daily   feeding supplement (NEPRO CARB STEADY)  237 mL Oral Q24H   heparin  5,000 Units Subcutaneous Q8H   insulin aspart  0-6 Units Subcutaneous TID WC   insulin detemir  20 Units Subcutaneous Daily   mupirocin ointment  1 Application Nasal BID   pantoprazole  40 mg Oral Daily   potassium chloride  40 mEq Oral Daily   pregabalin  25 mg Oral BID   vancomycin  125 mg  Oral QID    Dialysis Orders: TTS at [facility] -> last HD 4/4, cut time - post-weight 113.5kg 4:15hr, 450/A1.5, EDW 111.5kg, 2K/2Ca, LUE AVF, heparin 3000 unit bolus - Mircera IV q 2 weeks - appears to have missed last dose, last given 3/21 - Venofer 50mg  IV weekly - Hectoral IV q HD     Assessment/Plan: AMS: ?Due to infection, doubt uremia. Head CT negative. Seems to be back to baseline L foot osteomyelitis: Non-healing wound s/p L great toe amputation 09/08/22 - purulent drainage, x-Bellizzi suspicious for early osteo. Blood Cx collected, on Vanc + Ceftriaxone. Purplish area on dorsal foot - ?early calciphylaxis v. other ischemic changes. Ortho has seen here, ABIs completed. Plan is for 1st [place] amputation on 4/17. C.Diff colitis: Improving on PO Vancomycin.  ESRD: Had missed 2 HD prior to admit. Now back on track, continue TTS schedule - HD tomorrow. K low today - getting supplement, use 3K bath with HD for now. 5.  Hypertension/volume: BP improving, below her prior EDW. Will lower at discharge. 6.  Anemia: Hgb > 12, no ESA needed for now. 7.  Metabolic bone disease:  Ca ok, Phos controlled. Holding VDRA for now. Restart home binder (Velphoro). 8.  Nutrition: Alb low, continue supplements for wound healing.    Ozzie Hoyle, PA-C 10/26/2022, 9:46 AM  [employer]

## 2022-10-26 NOTE — Progress Notes (Signed)
Pharmacy Antibiotic Note  [name] is a 67 y.o. female admitted on 10/20/2022 with  left foot infection .  Continues on Vancomycin and Ceftriaxone with planned amputation on Wednesday  Patient is ESRD on HD T/Th/Sat.   Plan: - Vancomycin 1g IV with every HD session  - Continue Ceftriaxone 2 grams iv Q 24 hours - Monitor cultures and overall clinical picture   Height: 5\' 5"  (165.1 cm) Weight: 107.7 kg (237 lb 7 oz) IBW/kg (Calculated) : 57  Temp (24hrs), Avg:98.3 F (36.8 C), Min:98 F (36.7 C), Max:98.5 F (36.9 C)  Recent Labs  Lab 10/20/22 1601 10/21/22 0329 10/22/22 0259 10/23/22 0715 10/24/22 0307 10/26/22 0056  WBC  --  8.4 8.9 6.5 6.1 6.0  CREATININE  --  12.62* 8.44* 5.61* 7.75* 7.84*  LATICACIDVEN 1.5  --   --   --   --   --      Estimated Creatinine Clearance: 8.8 mL/min (A) (by C-G formula based on SCr of 7.84 mg/dL (H)).    Allergies  Allergen Reactions   Shellfish Allergy Anaphylaxis and Swelling   Diazepam Other (See Comments)    "felt like out of body experience"   Morphine Itching and Other (See Comments)    Tolerated hydromorphone on 07/2020 *will take along with Benadryl*       Thank you for allowing pharmacy to be a part of this patient's care. Okey Regal, PharmD 4/15/20249:30 AM

## 2022-10-26 NOTE — Progress Notes (Signed)
Patient refused dressing change this morning, she said, she would like to get more sleep with out bother.

## 2022-10-26 NOTE — Progress Notes (Signed)
PROGRESS NOTE  [name]  ORV:[id number] DOB: Oct 06, 1955 DOA: 10/20/2022 PCP: Felix Pacini, FNP  Brief Narrative:   67 year old black female known ESRD HD TTS DM TY 2 HFpEF EF 60-65% 09/11/2021-CAD 09/2016 NSTEMI Hep C unclear if treated Gout History of colostomy status post takedown 11/2021 for necrotizing fasciitis of abdomen--EGD colonoscopy 07/2022 unremarkable Chronic right hip pain-initial concerns of avascular necrosis have not been confirmed in the past  Patient had great toe amputation with Dr. Kandis Cocking and grew out Pseudomonas Prevotella and coagulase-negative staph completed 2 weeks of Cipro and Flagyl stop date 3/15  09/29/2022 visited orthopedics Dr. Emmit Pomfret orthopedics recently with [name] for bilateral osteoarthritis of knees and was recommended pain management referral versus lubrication injection  Admitted 10/20/2022 altered mental statusSodium 134 BUNs/creatinine 61/13 (this is baseline)Alk phos 233 albumin 3 AST/ALT normal WBC 9.2 platelets 173--- ammonia was 17  CT abdomen chest pelvis = pancolitis nodular liver bilateral renal cysts hypodensities (does not recommend any follow-up imaging) CT head and CT cervical spine negative for any abnormality Foot x-Klatt interval to great toe amputation with overlying soft tissue wound questionable subtle margin erosion of first metatarsal head?  Early osteomyelitis minimally displaced intra-articular fracture base of second proximal toe  Started on Vanco and Zosyn,  4/11 MRI foot suggestive of early osteomyelitis--- because of continued diarrhea found to be C. difficile positive on testing and started on oral Vanco 4/12 ABI shows resting R ABI within normal range, left ABI within normal range  Hospital-Problem based course  C. difficile colitis Retrospectively was recently on Cipro Flagyl until 3/15 so this was a risk factor Oral vancomycin started 4/11-symptoms have improved-treat for 14 days over above IV  antibiotic duration for osteomyelitis  Osteomyelitis first metatarsal head Much appreciated Dr. Lajoyce Corners input-planning on surgery amputation 4/17 for--Displaced intra-articular second proximal toe fracture  hold aspirin for now Zosyn-->ceftriaxone 4/13, continue vancomycin IV additionally until amputation 4/17 then STOP  Metabolic encephalopathy likely 2/2 C. difficile Resume Lyrica at 25 twice daily--oxycodone changed to Dilaudid-space out to 1 mg every 6 as needed Attempt lower dosing of meds perioperatively as will need an increase in pain control closer to discharge and surgery  ESRD AF [facility] TTS via LUE AVF Missed several sessions 4/6 and 10/20/2022 --hypertensive--hold midodrine 10 daily--- will add a small dose of amlodipine 5 daily as uncontrolled hypertension Watch phosphorus   Prior necrotizing fasciitis of the abdomen with colostomy takedown will continue broad-spectrum antibiotics for now  DM TY 2 with complication of in-hospital hypoglycemia-now eating 100% of meals CBGs are well-controlled in the 120-170 Home MAR was incorrect-we did cut back back Lantus to 20 units   DVT prophylaxis: Heparin Code Status: Full Family Communication: No family present today Disposition:  Status is: Inpatient Remains inpatient appropriate because:   Amputate planned for 4/17  Subjective:  No stools today and seems much more coherent awake alert oriented No chest pain no fever no chills Tolerating diet well and has been up to the bathroom as well today  Objective: Vitals:   10/25/22 2021 10/26/22 0337 10/26/22 0956 10/26/22 1539  BP: (!) 130/47 (!) 153/59 (!) 131/58 (!) 154/64  Pulse: 85 94 86 79  Resp: 18 18    Temp: 98.3 F (36.8 C) 98 F (36.7 C) 98.3 F (36.8 C) 98.2 F (36.8 C)  TempSrc: Oral Oral Oral Oral  SpO2: 100% 99% 100% 99%  Weight:      Height:  Intake/Output Summary (Last 24 hours) at 10/26/2022 1659 Last data filed at 10/26/2022 1322 Gross per 24 hour   Intake 1437.55 ml  Output --  Net 1437.55 ml    Filed Weights   10/23/22 0249 10/24/22 0817 10/24/22 1249  Weight: 107.3 kg 111.4 kg 107.7 kg    Examination:  pleasant no distress Chest is clear no wheeze rales rhonchi Abdomen soft much less tender than prior Left lower extremity not swollen  Data Reviewed: personally reviewed   CBC    Component Value Date/Time   WBC 6.0 10/26/2022 0056   RBC 4.60 10/26/2022 0056   HGB 12.9 10/26/2022 0056   HGB 9.7 (L) 09/27/2020 1417   HCT 40.7 10/26/2022 0056   HCT 28.9 (L) 09/27/2020 1417   PLT 166 10/26/2022 0056   PLT 209 09/27/2020 1417   MCV 88.5 10/26/2022 0056   MCV 89 09/27/2020 1417   MCH 28.0 10/26/2022 0056   MCHC 31.7 10/26/2022 0056   RDW 16.7 (H) 10/26/2022 0056   RDW 14.5 09/27/2020 1417   LYMPHSABS 1.3 10/26/2022 0056   LYMPHSABS 1.5 09/27/2020 1417   MONOABS 0.7 10/26/2022 0056   EOSABS 0.4 10/26/2022 0056   EOSABS 0.2 09/27/2020 1417   BASOSABS 0.0 10/26/2022 0056   BASOSABS 0.0 09/27/2020 1417      Latest Ref Rng & Units 10/26/2022   12:56 AM 10/24/2022    3:07 AM 10/23/2022    7:15 AM  CMP  Glucose 70 - 99 mg/dL 481  856  314   BUN 8 - 23 mg/dL 32  29  19   Creatinine 0.44 - 1.00 mg/dL 9.70  2.63  7.85   Sodium 135 - 145 mmol/L 135  134  134   Potassium 3.5 - 5.1 mmol/L 3.3  3.6  3.5   Chloride 98 - 111 mmol/L 95  92  94   CO2 22 - 32 mmol/L 25  27  25    Calcium 8.9 - 10.3 mg/dL 8.9  8.7  8.6      Radiology Studies: No results found.   Scheduled Meds:  (feeding supplement) PROSource Plus  30 mL Oral BID BM   amLODipine  5 mg Oral Daily   Chlorhexidine Gluconate Cloth  6 each Topical Daily   feeding supplement (NEPRO CARB STEADY)  237 mL Oral Q24H   heparin  5,000 Units Subcutaneous Q8H   insulin aspart  0-6 Units Subcutaneous TID WC   insulin detemir  20 Units Subcutaneous Daily   mupirocin ointment  1 Application Nasal BID   pantoprazole  40 mg Oral Daily   potassium chloride  40 mEq  Oral Daily   pregabalin  25 mg Oral BID   sucroferric oxyhydroxide  500 mg Oral TID WC   vancomycin  125 mg Oral QID   Continuous Infusions:  sodium chloride 10 mL/hr at 10/24/22 0611   cefTRIAXone (ROCEPHIN)  IV 2 g (10/26/22 1311)   vancomycin 1,000 mg (10/24/22 1112)     LOS: 6 days   Time spent: 30  Rhetta Mura, MD [facility] To contact the attending provider between 7A-7P or the covering provider during after hours 7P-7A, please log into the web site [email or web address] and access using universal [place] password for that web site. If you do not have the password, please call the hospital operator.  10/26/2022, 4:59 PM

## 2022-10-26 NOTE — Progress Notes (Signed)
Nutrition Follow-up  DOCUMENTATION CODES:   Obesity unspecified  INTERVENTION:  - Continue Prosource Plus BID.   - Continue Nepro Shake po q day, each supplement provides 425 kcal and 19 grams protein  NUTRITION DIAGNOSIS:   Increased nutrient needs related to acute illness, chronic illness as evidenced by estimated needs.  GOAL:   Patient will meet greater than or equal to 90% of their needs  MONITOR:   PO intake, Supplement acceptance, Labs, Weight trends, Skin  REASON FOR ASSESSMENT:   Consult Assessment of nutrition requirement/status, Calorie Count, Diet education  ASSESSMENT:   67 year-old female with medical history of type 2 DM, HTN, hepatitis C, pancreatitis, asthma, CAD, CHF, aortic stenosis, gout, GERD, ESRD on HD, anemia, arthritis, depression, MI, and heart murmur. Patient also with hx of colostomy s/p takedown in 11/2021 due to necrotizing fasciitis of abdomen. She presented to the ED due to AMS. CT abdomen/pelvis and chest indicated pancolitis, nodular liver, bilateral renal cysts. CT head and CT cervical spine without acute findings.  Meds reviewed:  sliding scale insulin, levemir (20 units), klor-con. Labs reviewed: K low, chloride low, BUN/creatinine elevated, phos elevated.   The pt reports that she was not eating well upon admission. Per record, pt has been eating 50-100% since admission. A calorie count was started on the pt. Only one ticket to collect in the folder. Pt ate 100% of her breakfast this am. Pt is likely meeting her needs at this time. RD discussed discontinuing calorie count with MD. Will continue to monitor PO intakes.   NUTRITION - FOCUSED PHYSICAL EXAM:  WDL - no wasting noted.   Diet Order:   Diet Order             Diet renal/carb modified with fluid restriction Diet-HS Snack? Nothing; Fluid restriction: 1500 mL Fluid; Room service appropriate? Yes; Fluid consistency: Thin  Diet effective now                   EDUCATION NEEDS:    Not appropriate for education at this time  Skin:  Skin Assessment: Skin Integrity Issues: Skin Integrity Issues:: Incisions Incisions: L great toe amputation site (10/20/22)  Last BM:  4/14 (type 6 x1, medium amount)  Height:   Ht Readings from Last 1 Encounters:  10/20/22 5\' 5"  (1.651 m)    Weight:   Wt Readings from Last 1 Encounters:  10/24/22 107.7 kg    Ideal Body Weight:  56.8 kg  BMI:  Body mass index is 39.51 kg/m.  Estimated Nutritional Needs:   Kcal:  2100-2300 kcal  Protein:  110-125 grams  Fluid:  UOP + 1 L/day  Bethann Humble, RD, LDN, CNSC.

## 2022-10-27 DIAGNOSIS — G9340 Encephalopathy, unspecified: Secondary | ICD-10-CM | POA: Diagnosis not present

## 2022-10-27 LAB — RENAL FUNCTION PANEL
Albumin: 3 g/dL — ABNORMAL LOW (ref 3.5–5.0)
Anion gap: 14 (ref 5–15)
BUN: 44 mg/dL — ABNORMAL HIGH (ref 8–23)
CO2: 22 mmol/L (ref 22–32)
Calcium: 8.3 mg/dL — ABNORMAL LOW (ref 8.9–10.3)
Chloride: 97 mmol/L — ABNORMAL LOW (ref 98–111)
Creatinine, Ser: 8.91 mg/dL — ABNORMAL HIGH (ref 0.44–1.00)
GFR, Estimated: 5 mL/min — ABNORMAL LOW (ref >60)
Glucose, Bld: 139 mg/dL — ABNORMAL HIGH (ref 70–99)
Phosphorus: 5.4 mg/dL — ABNORMAL HIGH (ref 2.5–4.6)
Potassium: 3.7 mmol/L (ref 3.5–5.1)
Sodium: 133 mmol/L — ABNORMAL LOW (ref 135–145)

## 2022-10-27 LAB — CBC WITH DIFFERENTIAL/PLATELET
Abs Immature Granulocytes: 0.09 10*3/uL — ABNORMAL HIGH (ref 0.00–0.07)
Basophils Absolute: 0 10*3/uL (ref 0.0–0.1)
Basophils Relative: 1 %
Eosinophils Absolute: 0.4 10*3/uL (ref 0.0–0.5)
Eosinophils Relative: 8 %
HCT: 41.2 % (ref 36.0–46.0)
Hemoglobin: 12.4 g/dL (ref 12.0–15.0)
Immature Granulocytes: 2 %
Lymphocytes Relative: 27 %
Lymphs Abs: 1.5 10*3/uL (ref 0.7–4.0)
MCH: 27.2 pg (ref 26.0–34.0)
MCHC: 30.1 g/dL (ref 30.0–36.0)
MCV: 90.4 fL (ref 80.0–100.0)
Monocytes Absolute: 0.6 10*3/uL (ref 0.1–1.0)
Monocytes Relative: 11 %
Neutro Abs: 2.8 10*3/uL (ref 1.7–7.7)
Neutrophils Relative %: 51 %
Platelets: 169 10*3/uL (ref 150–400)
RBC: 4.56 MIL/uL (ref 3.87–5.11)
RDW: 16.6 % — ABNORMAL HIGH (ref 11.5–15.5)
WBC: 5.4 10*3/uL (ref 4.0–10.5)
nRBC: 0 % (ref 0.0–0.2)

## 2022-10-27 LAB — GLUCOSE, CAPILLARY
Glucose-Capillary: 159 mg/dL — ABNORMAL HIGH (ref 70–99)
Glucose-Capillary: 153 mg/dL — ABNORMAL HIGH (ref 70–99)
Glucose-Capillary: 177 mg/dL — ABNORMAL HIGH (ref 70–99)
Glucose-Capillary: 178 mg/dL — ABNORMAL HIGH (ref 70–99)

## 2022-10-27 MED ORDER — PREGABALIN 25 MG PO CAPS
50.0000 mg | ORAL_CAPSULE | Freq: Two times a day (BID) | ORAL | Status: DC
  Administered 2022-10-27 – 2022-10-29 (×5): 50 mg via ORAL
  Filled 2022-10-27 (×5): qty 2

## 2022-10-27 MED ORDER — HEPARIN SODIUM (PORCINE) 1000 UNIT/ML DIALYSIS
3000.0000 [IU] | Freq: Once | INTRAMUSCULAR | Status: AC
  Administered 2022-10-27: 3000 [IU] via INTRAVENOUS_CENTRAL
  Filled 2022-10-27: qty 3

## 2022-10-27 MED ORDER — PROCHLORPERAZINE EDISYLATE 10 MG/2ML IJ SOLN
5.0000 mg | Freq: Four times a day (QID) | INTRAMUSCULAR | Status: DC | PRN
  Administered 2022-10-27: 5 mg via INTRAVENOUS
  Filled 2022-10-27 (×2): qty 2

## 2022-10-27 NOTE — Progress Notes (Signed)
PROGRESS NOTE  [name]  ONG:[id number] DOB: 1956/06/02 DOA: 10/20/2022 PCP: Felix Pacini, FNP  Brief Narrative:   67 year old black female known ESRD HD TTS DM TY 2 HFpEF EF 60-65% 09/11/2021-CAD 09/2016 NSTEMI Hep C unclear if treated Gout History of colostomy status post takedown 11/2021 for necrotizing fasciitis of abdomen--EGD colonoscopy 07/2022 unremarkable Chronic right hip pain-initial concerns of avascular necrosis have not been confirmed in the past  Patient had great toe amputation with Dr. Kandis Cocking and grew out Pseudomonas Prevotella and coagulase-negative staph completed 2 weeks of Cipro and Flagyl stop date 3/15  09/29/2022 visited orthopedics Dr. Emmit Pomfret orthopedics recently with [name] for bilateral osteoarthritis of knees and was recommended pain management referral versus lubrication injection  Admitted 10/20/2022 altered mental status--Sodium 134 BUNs/creatinine 61/13 (this is baseline)Alk phos 233 albumin 3 AST/ALT normal WBC 9.2 platelets 173--- ammonia was 17  CT abdomen chest pelvis = pancolitis nodular liver bilateral renal cysts hypodensities (does not recommend any follow-up imaging) CT head and CT cervical spine negative for any abnormality Foot x-Mcnutt =great toe amputation with overlying soft tissue wound questionable subtle margin erosion of first metatarsal head?  Early osteomyelitis minimally displaced intra-articular fracture base of second proximal toe  Started on Vanco and Zosyn 4/11 MRI foot suggestive of early osteomyelitis--- because of continued diarrhea found to be C. difficile positive on testing and started on oral Vanco 4/12 ABI shows resting R ABI within normal range, left ABI within normal range  Hospital-Problem based course  C. difficile colitis--risks--was recently on Cipro Flagyl until 3/15  Oral vancomycin started 4/11-symptoms have improved-treat for 14 days over above IV antibiotic duration for  osteomyelitis  Osteomyelitis first metatarsal head Much appreciated Dr. Lajoyce Corners input-pfor surgery amputation 4/17 for--Displaced intra-articular second proximal toe fracture hold aspirin for now Zosyn-->ceftriaxone 4/13, continue vancomycin IV additionally until amputation 4/17 then STOP?  Metabolic encephalopathy likely 2/2 C. Difficile,polypharmacy Oxycodone changed to Dilaudid-space out to 1 mg every 6 as needed 4/16 complains of being unable to sleep overnight secondary to neuropathic shooting pain down both legs therefore at patient's request increase the Lyrica to 50 twice daily which is the home dose (had previously been decreased)-watch for confusion overnight tonight  ESRD AF [facility] TTS via LUE AVF Missed several sessions 4/6 and 10/20/2022 --hypertensive--hold midodrine 10 daily--- started amlodipine 5 daily as uncontrolled hypertension Watch phosphorus  Challenge EDW as per renal  Prior necrotizing fasciitis of the abdomen with colostomy takedown  DM TY 2 with complication of in-hospital hypoglycemia-now eating 100% of meals CBG = 140-180 Home MAR was incorrect-we did cut back back Lantus to 20 units --- she should go home on this dose  DVT prophylaxis: Heparin Code Status: Full Family Communication: No family present today Disposition:  Status is: Inpatient Remains inpatient appropriate because:   Amputate planned for 4/17--- will need postprocedure therapy eval and pain control expect will discharge later in the week  Subjective:  Seen on HD unit Could not sleep last night secondary to burning pain down both legs Tells me that she was on a higher dose of Lyrica prior to admission-explained rationale why I cut it back because she was confused when she first came in-no other specific complaints other than a little sleepy from not being able to sleep because of the pain   Objective: Vitals:   10/26/22 1539 10/26/22 1938 10/27/22 0534 10/27/22 0800  BP: (!) 154/64 132/60 (!)  140/68 (!) 154/59  Pulse: 79 75 76 74  Resp:  Temp: 98.2 F (36.8 C) 98.6 F (37 C) 98 F (36.7 C)   TempSrc: Oral Oral Oral   SpO2: 99% 99% 99% 99%  Weight:      Height:        Intake/Output Summary (Last 24 hours) at 10/27/2022 0831 Last data filed at 10/26/2022 1900 Gross per 24 hour  Intake 1083.09 ml  Output --  Net 1083.09 ml    Filed Weights   10/23/22 0249 10/24/22 0817 10/24/22 1249  Weight: 107.3 kg 111.4 kg 107.7 kg    Examination:  Sleep arousable black female thick neck Mallampati 4 S1-S2 no murmur Dialyzing through left fistula Abdomen obese nontender no rebound ROM intact-wound not examined on L LE  Data Reviewed: personally reviewed   CBC    Component Value Date/Time   WBC 5.4 10/27/2022 0242   RBC 4.56 10/27/2022 0242   HGB 12.4 10/27/2022 0242   HGB 9.7 (L) 09/27/2020 1417   HCT 41.2 10/27/2022 0242   HCT 28.9 (L) 09/27/2020 1417   PLT 169 10/27/2022 0242   PLT 209 09/27/2020 1417   MCV 90.4 10/27/2022 0242   MCV 89 09/27/2020 1417   MCH 27.2 10/27/2022 0242   MCHC 30.1 10/27/2022 0242   RDW 16.6 (H) 10/27/2022 0242   RDW 14.5 09/27/2020 1417   LYMPHSABS 1.5 10/27/2022 0242   LYMPHSABS 1.5 09/27/2020 1417   MONOABS 0.6 10/27/2022 0242   EOSABS 0.4 10/27/2022 0242   EOSABS 0.2 09/27/2020 1417   BASOSABS 0.0 10/27/2022 0242   BASOSABS 0.0 09/27/2020 1417      Latest Ref Rng & Units 10/27/2022    2:42 AM 10/26/2022   12:56 AM 10/24/2022    3:07 AM  CMP  Glucose 70 - 99 mg/dL 784  696  295   BUN 8 - 23 mg/dL 44  32  29   Creatinine 0.44 - 1.00 mg/dL 2.84  1.32  4.40   Sodium 135 - 145 mmol/L 133  135  134   Potassium 3.5 - 5.1 mmol/L 3.7  3.3  3.6   Chloride 98 - 111 mmol/L 97  95  92   CO2 22 - 32 mmol/L Calcium 8.9 - 10.3 mg/dL 8.3  8.9  8.7      Radiology Studies: No results found.   Scheduled Meds:  (feeding supplement) PROSource Plus  30 mL Oral BID BM   amLODipine  5 mg Oral Daily    Chlorhexidine Gluconate Cloth  6 each Topical Daily   feeding supplement (NEPRO CARB STEADY)  237 mL Oral Q24H   heparin  3,000 Units Dialysis Once in dialysis   heparin  5,000 Units Subcutaneous Q8H   insulin aspart  0-6 Units Subcutaneous TID WC   insulin detemir  20 Units Subcutaneous Daily   pantoprazole  40 mg Oral Daily   potassium chloride  40 mEq Oral Daily   pregabalin  50 mg Oral BID   sucroferric oxyhydroxide  500 mg Oral TID WC   vancomycin  125 mg Oral QID   Continuous Infusions:  sodium chloride 10 mL/hr at 10/24/22 0611   cefTRIAXone (ROCEPHIN)  IV 2 g (10/26/22 1311)   vancomycin 1,000 mg (10/24/22 1112)     LOS: 7 days   Time spent: 30  Rhetta Mura, MD [facility] To contact the attending provider between 7A-7P or the covering provider during after hours 7P-7A, please log into the web site  [email or web address] and access using universal [place] password for that web site. If you do not have the password, please call the hospital operator.  10/27/2022, 8:31 AM

## 2022-10-27 NOTE — Progress Notes (Signed)
Pt in hemodialysis at this time, unable to assess.

## 2022-10-27 NOTE — Progress Notes (Signed)
[facility] Progress Note   Subjective:   Pt seen while laying in bed on HD machine. Pt reports that she will have further debridement tomorrow (4/17) morning for "bone infection." Pt tolerating dialysis well without cramping. Denies CP/SOB, chills , and diaphoresis. Reports no other complaints at this time.  Objective Vitals:   10/27/22 0900 10/27/22 0930 10/27/22 1000 10/27/22 1030  BP: (!) 154/61 (!) 164/68 (!) 150/70 (!) 140/63  Pulse: 71 80 72 71  Resp: 11 12 11 10   Temp:      TempSrc:      SpO2: 98% 97% 98% 97%  Weight:      Height:       Physical Exam General: Well appearing female laying in bed comfortably on RA. Heart: RRR Lungs: CTAB Extremities: LLE bandaged at great toe. No notable LE edema bilaterally. Dialysis Access: LUE AVF cannulated  Additional Objective Labs: Basic Metabolic Panel: Recent Labs  Lab 10/24/22 0307 10/26/22 0056 10/27/22 0242  NA 134* 135 133*  K 3.6 3.3* 3.7  CL 92* 95* 97*  CO2 27 25 22   GLUCOSE 129* 136* 139*  BUN 29* 32* 44*  CREATININE 7.75* 7.84* 8.91*  CALCIUM 8.7* 8.9 8.3*  PHOS 5.0* 5.3* 5.4*   Liver Function Tests: Recent Labs  Lab 10/20/22 1435 10/21/22 0329 10/22/22 0259 10/24/22 0307 10/26/22 0056 10/27/22 0242  AST 15 10*  --   --   --   --   ALT 18 15  --   --   --   --   ALKPHOS 273* 233*  --   --   --   --   BILITOT 0.4 0.6  --   --   --   --   PROT 7.0 6.3*  --   --   --   --   ALBUMIN 3.5 3.0*   < > 2.7* 3.1* 3.0*   < > = values in this interval not displayed.   CBC: Recent Labs  Lab 10/22/22 0259 10/23/22 0715 10/24/22 0307 10/26/22 0056 10/27/22 0242  WBC 8.9 6.5 6.1 6.0 5.4  NEUTROABS  --   --   --  3.4 2.8  HGB 12.4 12.9 13.0 12.9 12.4  HCT 40.3 40.5 41.1 40.7 41.2  MCV 90.6 87.5 88.6 88.5 90.4  PLT 135* 159 156 166 169   Blood Culture    Component Value Date/Time   SDES BLOOD RIGHT ARM 10/20/2022 1538   SPECREQUEST  10/20/2022 1538    BOTTLES DRAWN AEROBIC AND  ANAEROBIC Blood Culture adequate volume   CULT  10/20/2022 1538    NO GROWTH 5 DAYS Performed at [facility], [address], [place], [place] [address]    REPTSTATUS 10/25/2022 FINAL 10/20/2022 1538   CBG: Recent Labs  Lab 10/26/22 0806 10/26/22 1228 10/26/22 1533 10/26/22 2341 10/27/22 0723  GLUCAP 127* 175* 154* 213* 159*   Medications:  sodium chloride 10 mL/hr at 10/24/22 0611   cefTRIAXone (ROCEPHIN)  IV 2 g (10/26/22 1311)   vancomycin 1,000 mg (10/27/22 1106)    (feeding supplement) PROSource Plus  30 mL Oral BID BM   amLODipine  5 mg Oral Daily   Chlorhexidine Gluconate Cloth  6 each Topical Daily   feeding supplement (NEPRO CARB STEADY)  237 mL Oral Q24H   heparin  5,000 Units Subcutaneous Q8H   insulin aspart  0-6 Units Subcutaneous TID WC   insulin detemir  20 Units Subcutaneous Daily   pantoprazole  40 mg  Oral Daily   potassium chloride  40 mEq Oral Daily   pregabalin  50 mg Oral BID   sucroferric oxyhydroxide  500 mg Oral TID WC   vancomycin  125 mg Oral QID    Dialysis Orders: TTS at [facility] -> last HD 4/4, cut time - post-weight 113.5kg 4:15hr, 450/A1.5, EDW 111.5kg, 2K/2Ca, LUE AVF, heparin 3000 unit bolus - Mircera IV q 2 weeks - appears to have missed last dose, last given 3/21 - Venofer  IV weekly - Hectoral IV q HD     Assessment/Plan: AMS: ?Due to infection, doubt uremia. Head CT negative. Resolved, without altered mentation on examination. L foot osteomyelitis: Non-healing wound s/p L great toe amputation 09/08/22 - purulent drainage, x-Beckles suspicious for early osteo. Blood Cx collected, on Vanc + Ceftriaxone. Ortho has seen here, ABIs completed. Plan is for 1st [place] amputation on 4/17. C.Diff colitis: Improving on PO Vancomycin.  ESRD: Had missed 2 HD prior to admit. Now back on track, continue TTS schedule - HD currently. K 3.3->3.7 today - getting supplement, used 3K bath with HD for now. 5.  Hypertension/volume: BP improving,  below her prior EDW. Will lower at discharge. 6.  Anemia: Hgb > 12, no ESA needed for now. 7.  Metabolic bone disease: Ca ok, Phos controlled. Holding VDRA for now. Restart home binder (Velphoro). 8.  Nutrition: Alb low, continue supplements for wound healing.  H&P and note completed by student: Melissa Montane but overseen by me.  Ozzie Hoyle, PA-C 10/27/2022, 11:41 AM  [employer]

## 2022-10-27 NOTE — Progress Notes (Signed)
POST HD TX NOTE  10/27/22 1245  Vitals  Temp 97.6 F (36.4 C)  Temp Source Oral  BP (!) 144/77  MAP (mmHg) 95  BP Location Right Arm  BP Method Automatic  Patient Position (if appropriate) Lying  Pulse Rate 75  Pulse Rate Source Monitor  ECG Heart Rate 76  Resp 10  Oxygen Therapy  SpO2 99 %  O2 Device Room Air  Pulse Oximetry Type Continuous  During Treatment Monitoring  Intra-Hemodialysis Comments (S)   (post HD tx VS check)  Post Treatment  Dialyzer Clearance Lightly streaked  Duration of HD Treatment -hour(s) 3.75 hour(s)  Hemodialysis Intake (mL) 200 mL (Vancomycin)  Liters Processed 88.4  Fluid Removed (mL) 2500 mL  Tolerated HD Treatment Yes  Post-Hemodialysis Comments (S)  tx completed w/o problem, UF goal met, blood rinsed bakc, VSS. Medication Admin: Heparin 3000 units bolus, Vancomycin 1000 mg IVPB  AVG/AVF Arterial Site Held (minutes) 5 minutes  AVG/AVF Venous Site Held (minutes) 5 minutes  Fistula / Graft Left Upper arm Arteriovenous fistula  Placement Date/Time: 07/24/22 0745   Placed prior to admission: Yes  Orientation: Left  Access Location: Upper arm  Access Type: Arteriovenous fistula  Site Condition No complications  Fistula / Graft Assessment Bruit;Thrill;Present  Drainage Description None

## 2022-10-28 ENCOUNTER — Encounter (HOSPITAL_COMMUNITY)
Admission: EM | Disposition: A | Discharge status: Home Health | Payer: Self-pay | Source: Home / Self Care | Attending: Family Medicine

## 2022-10-28 ENCOUNTER — Inpatient Hospital Stay (HOSPITAL_COMMUNITY): Payer: 59 | Admitting: Anesthesiology

## 2022-10-28 ENCOUNTER — Other Ambulatory Visit: Payer: Self-pay

## 2022-10-28 ENCOUNTER — Encounter (HOSPITAL_COMMUNITY): Payer: Self-pay | Admitting: Family Medicine

## 2022-10-28 DIAGNOSIS — M869 Osteomyelitis, unspecified: Secondary | ICD-10-CM

## 2022-10-28 DIAGNOSIS — Z87891 Personal history of nicotine dependence: Secondary | ICD-10-CM

## 2022-10-28 DIAGNOSIS — E1169 Type 2 diabetes mellitus with other specified complication: Secondary | ICD-10-CM | POA: Diagnosis not present

## 2022-10-28 DIAGNOSIS — J449 Chronic obstructive pulmonary disease, unspecified: Secondary | ICD-10-CM

## 2022-10-28 DIAGNOSIS — M86272 Subacute osteomyelitis, left ankle and foot: Secondary | ICD-10-CM | POA: Diagnosis not present

## 2022-10-28 DIAGNOSIS — M86172 Other acute osteomyelitis, left ankle and foot: Secondary | ICD-10-CM | POA: Diagnosis not present

## 2022-10-28 DIAGNOSIS — G9340 Encephalopathy, unspecified: Secondary | ICD-10-CM | POA: Diagnosis not present

## 2022-10-28 DIAGNOSIS — K5100 Ulcerative (chronic) pancolitis without complications: Secondary | ICD-10-CM | POA: Diagnosis not present

## 2022-10-28 HISTORY — PX: AMPUTATION: SHX166

## 2022-10-28 LAB — GLUCOSE, CAPILLARY
Glucose-Capillary: 136 mg/dL — ABNORMAL HIGH (ref 70–99)
Glucose-Capillary: 148 mg/dL — ABNORMAL HIGH (ref 70–99)
Glucose-Capillary: 142 mg/dL — ABNORMAL HIGH (ref 70–99)
Glucose-Capillary: 155 mg/dL — ABNORMAL HIGH (ref 70–99)
Glucose-Capillary: 160 mg/dL — ABNORMAL HIGH (ref 70–99)

## 2022-10-28 LAB — CBC WITH DIFFERENTIAL/PLATELET
Abs Immature Granulocytes: 0.07 10*3/uL (ref 0.00–0.07)
Basophils Absolute: 0 10*3/uL (ref 0.0–0.1)
Basophils Relative: 1 %
Eosinophils Absolute: 0.4 10*3/uL (ref 0.0–0.5)
Eosinophils Relative: 6 %
HCT: 41.7 % (ref 36.0–46.0)
Hemoglobin: 13.3 g/dL (ref 12.0–15.0)
Immature Granulocytes: 1 %
Lymphocytes Relative: 22 %
Lymphs Abs: 1.4 10*3/uL (ref 0.7–4.0)
MCH: 28.2 pg (ref 26.0–34.0)
MCHC: 31.9 g/dL (ref 30.0–36.0)
MCV: 88.3 fL (ref 80.0–100.0)
Monocytes Absolute: 0.6 10*3/uL (ref 0.1–1.0)
Monocytes Relative: 10 %
Neutro Abs: 3.8 10*3/uL (ref 1.7–7.7)
Neutrophils Relative %: 60 %
Platelets: 153 10*3/uL (ref 150–400)
RBC: 4.72 MIL/uL (ref 3.87–5.11)
RDW: 16.7 % — ABNORMAL HIGH (ref 11.5–15.5)
WBC: 6.3 10*3/uL (ref 4.0–10.5)
nRBC: 0 % (ref 0.0–0.2)

## 2022-10-28 LAB — RENAL FUNCTION PANEL
Albumin: 3.1 g/dL — ABNORMAL LOW (ref 3.5–5.0)
Anion gap: 11 (ref 5–15)
BUN: 30 mg/dL — ABNORMAL HIGH (ref 8–23)
CO2: 26 mmol/L (ref 22–32)
Calcium: 9.2 mg/dL (ref 8.9–10.3)
Chloride: 96 mmol/L — ABNORMAL LOW (ref 98–111)
Creatinine, Ser: 6.51 mg/dL — ABNORMAL HIGH (ref 0.44–1.00)
GFR, Estimated: 7 mL/min — ABNORMAL LOW (ref >60)
Glucose, Bld: 144 mg/dL — ABNORMAL HIGH (ref 70–99)
Phosphorus: 4.4 mg/dL (ref 2.5–4.6)
Potassium: 3.9 mmol/L (ref 3.5–5.1)
Sodium: 133 mmol/L — ABNORMAL LOW (ref 135–145)

## 2022-10-28 SURGERY — AMPUTATION, FOOT, RAY
Anesthesia: Monitor Anesthesia Care | Site: Foot | Laterality: Left

## 2022-10-28 MED ORDER — LIDOCAINE-EPINEPHRINE (PF) 1.5 %-1:200000 IJ SOLN
INTRAMUSCULAR | Status: DC | PRN
  Administered 2022-10-28: 20 mL

## 2022-10-28 MED ORDER — CHLORHEXIDINE GLUCONATE 0.12 % MT SOLN
OROMUCOSAL | Status: AC
  Administered 2022-10-28: 15 mL
  Filled 2022-10-28: qty 15

## 2022-10-28 MED ORDER — FENTANYL CITRATE (PF) 100 MCG/2ML IJ SOLN
INTRAMUSCULAR | Status: AC
  Filled 2022-10-28: qty 2

## 2022-10-28 MED ORDER — PROPOFOL 10 MG/ML IV BOLUS
INTRAVENOUS | Status: DC | PRN
  Administered 2022-10-28 (×2): 30 mg via INTRAVENOUS

## 2022-10-28 MED ORDER — OXYCODONE HCL 5 MG PO TABS: 5.0000 mg | ORAL_TABLET | Freq: Once | ORAL | Status: DC | PRN

## 2022-10-28 MED ORDER — POVIDONE-IODINE 10 % EX SWAB: 2.0000 | Freq: Once | CUTANEOUS | Status: DC

## 2022-10-28 MED ORDER — FENTANYL CITRATE (PF) 100 MCG/2ML IJ SOLN
25.0000 ug | INTRAMUSCULAR | Status: DC | PRN
  Administered 2022-10-28: 50 ug via INTRAVENOUS

## 2022-10-28 MED ORDER — MUPIROCIN 2 % EX OINT
TOPICAL_OINTMENT | CUTANEOUS | Status: AC
  Filled 2022-10-28: qty 22

## 2022-10-28 MED ORDER — HYDROMORPHONE HCL 1 MG/ML IJ SOLN
0.5000 mg | INTRAMUSCULAR | Status: AC
  Administered 2022-10-28: 0.5 mg via INTRAVENOUS
  Filled 2022-10-28: qty 1

## 2022-10-28 MED ORDER — PROPOFOL 500 MG/50ML IV EMUL
INTRAVENOUS | Status: DC | PRN
  Administered 2022-10-28: 90 ug/kg/min via INTRAVENOUS

## 2022-10-28 MED ORDER — CHLORHEXIDINE GLUCONATE 4 % EX SOLN
60.0000 mL | Freq: Once | CUTANEOUS | Status: DC
  Administered 2022-10-28: 4 via TOPICAL
  Filled 2022-10-28: qty 60

## 2022-10-28 MED ORDER — ATORVASTATIN CALCIUM 80 MG PO TABS
80.0000 mg | ORAL_TABLET | Freq: Every evening | ORAL | Status: DC
  Administered 2022-10-28 – 2022-10-31 (×4): 80 mg via ORAL
  Filled 2022-10-28 (×4): qty 1

## 2022-10-28 MED ORDER — VANCOMYCIN HCL 1500 MG/300ML IV SOLN
1500.0000 mg | INTRAVENOUS | Status: AC
  Administered 2022-10-28: 1500 mg via INTRAVENOUS
  Filled 2022-10-28: qty 300

## 2022-10-28 MED ORDER — INSULIN ASPART 100 UNIT/ML IJ SOLN
INTRAMUSCULAR | Status: AC
  Filled 2022-10-28: qty 1

## 2022-10-28 MED ORDER — FLUTICASONE FUROATE-VILANTEROL 200-25 MCG/ACT IN AEPB
1.0000 | INHALATION_SPRAY | Freq: Every day | RESPIRATORY_TRACT | Status: DC
  Administered 2022-10-29 – 2022-11-01 (×4): 1 via RESPIRATORY_TRACT
  Filled 2022-10-28 (×2): qty 28

## 2022-10-28 MED ORDER — CEFAZOLIN SODIUM-DEXTROSE 2-4 GM/100ML-% IV SOLN
INTRAVENOUS | Status: AC
  Filled 2022-10-28: qty 100

## 2022-10-28 MED ORDER — 0.9 % SODIUM CHLORIDE (POUR BTL) OPTIME
TOPICAL | Status: DC | PRN
  Administered 2022-10-28: 1000 mL

## 2022-10-28 MED ORDER — INSULIN ASPART 100 UNIT/ML IJ SOLN
0.0000 [IU] | INTRAMUSCULAR | Status: DC | PRN
  Administered 2022-10-28: 2 [IU] via SUBCUTANEOUS

## 2022-10-28 MED ORDER — MUPIROCIN 2 % EX OINT
1.0000 | TOPICAL_OINTMENT | Freq: Once | CUTANEOUS | Status: AC
  Administered 2022-10-28: 1 via TOPICAL

## 2022-10-28 MED ORDER — PHENYLEPHRINE 80 MCG/ML (10ML) SYRINGE FOR IV PUSH (FOR BLOOD PRESSURE SUPPORT)
PREFILLED_SYRINGE | INTRAVENOUS | Status: DC | PRN
  Administered 2022-10-28: 80 ug via INTRAVENOUS
  Administered 2022-10-28: 160 ug via INTRAVENOUS
  Administered 2022-10-28: 80 ug via INTRAVENOUS

## 2022-10-28 MED ORDER — OXYCODONE HCL 5 MG/5ML PO SOLN: 5.0000 mg | Freq: Once | ORAL | Status: DC | PRN

## 2022-10-28 MED ORDER — CEFAZOLIN SODIUM-DEXTROSE 2-4 GM/100ML-% IV SOLN
2.0000 g | INTRAVENOUS | Status: AC
  Administered 2022-10-28: 2 g via INTRAVENOUS
  Filled 2022-10-28: qty 100

## 2022-10-28 MED ORDER — PHENYLEPHRINE 80 MCG/ML (10ML) SYRINGE FOR IV PUSH (FOR BLOOD PRESSURE SUPPORT)
PREFILLED_SYRINGE | INTRAVENOUS | Status: AC
  Filled 2022-10-28: qty 10

## 2022-10-28 MED ORDER — SODIUM CHLORIDE 0.9 % IV SOLN: INTRAVENOUS | Status: DC | PRN

## 2022-10-28 SURGICAL SUPPLY — 37 items
BAG COUNTER SPONGE SURGICOUNT (BAG) ×1 IMPLANT
BAG SPNG CNTER NS LX DISP (BAG) ×1
BLADE SAW SGTL MED 73X18.5 STR (BLADE) IMPLANT
BLADE SURG 21 STRL SS (BLADE) ×1 IMPLANT
BNDG CMPR 5X4 CHSV STRCH STRL (GAUZE/BANDAGES/DRESSINGS) ×1
BNDG COHESIVE 4X5 TAN STRL (GAUZE/BANDAGES/DRESSINGS) ×1 IMPLANT
BNDG COHESIVE 4X5 TAN STRL LF (GAUZE/BANDAGES/DRESSINGS) IMPLANT
BNDG GAUZE DERMACEA FLUFF 4 (GAUZE/BANDAGES/DRESSINGS) ×1 IMPLANT
BNDG GZE DERMACEA 4 6PLY (GAUZE/BANDAGES/DRESSINGS)
COVER SURGICAL LIGHT HANDLE (MISCELLANEOUS) ×2 IMPLANT
DRAPE DERMATAC (DRAPES) IMPLANT
DRAPE U-SHAPE 47X51 STRL (DRAPES) ×2 IMPLANT
DRESSING PEEL AND PLC PRVNA 13 (GAUZE/BANDAGES/DRESSINGS) IMPLANT
DRSG ADAPTIC 3X8 NADH LF (GAUZE/BANDAGES/DRESSINGS) ×1 IMPLANT
DRSG PEEL AND PLACE PREVENA 13 (GAUZE/BANDAGES/DRESSINGS) ×1
DURAPREP 26ML APPLICATOR (WOUND CARE) ×1 IMPLANT
ELECT REM PT RETURN 9FT ADLT (ELECTROSURGICAL) ×1
ELECTRODE REM PT RTRN 9FT ADLT (ELECTROSURGICAL) ×1 IMPLANT
GAUZE PAD ABD 8X10 STRL (GAUZE/BANDAGES/DRESSINGS) ×2 IMPLANT
GAUZE SPONGE 4X4 12PLY STRL (GAUZE/BANDAGES/DRESSINGS) ×1 IMPLANT
GLOVE BIOGEL PI IND STRL 9 (GLOVE) ×1 IMPLANT
GLOVE SURG ORTHO 9.0 STRL STRW (GLOVE) ×1 IMPLANT
GOWN STRL REUS W/ TWL XL LVL3 (GOWN DISPOSABLE) ×2 IMPLANT
GOWN STRL REUS W/TWL XL LVL3 (GOWN DISPOSABLE) ×2
GRAFT SKIN WND MICRO 38 (Tissue) IMPLANT
KIT BASIN OR (CUSTOM PROCEDURE TRAY) ×1 IMPLANT
KIT DRSG PREVENA PLUS 7DAY 125 (MISCELLANEOUS) IMPLANT
KIT TURNOVER KIT B (KITS) ×1 IMPLANT
NS IRRIG 1000ML POUR BTL (IV SOLUTION) ×1 IMPLANT
PACK ORTHO EXTREMITY (CUSTOM PROCEDURE TRAY) ×1 IMPLANT
PAD ARMBOARD 7.5X6 YLW CONV (MISCELLANEOUS) ×2 IMPLANT
STOCKINETTE IMPERVIOUS LG (DRAPES) IMPLANT
SUT ETHILON 2 0 FS 18 (SUTURE) IMPLANT
SUT ETHILON 2 0 PSLX (SUTURE) ×1 IMPLANT
TOWEL GREEN STERILE (TOWEL DISPOSABLE) ×1 IMPLANT
TUBE CONNECTING 12X1/4 (SUCTIONS) ×1 IMPLANT
YANKAUER SUCT BULB TIP NO VENT (SUCTIONS) ×1 IMPLANT

## 2022-10-28 NOTE — Progress Notes (Signed)
[facility] Progress Note   Subjective:   Pt seen in hospital room post-surgically. Pt has been feeling "good" since her surgery. Believes that surgery went well. Denies CP/SOB. Pt has no other questions or complaints at this time.  Objective Vitals:   10/28/22 1001 10/28/22 1125 10/28/22 1140 10/28/22 1219  BP: (!) 152/66 (!) 147/68 138/62 (!) 133/53  Pulse: 81 89 79 74  Resp: 18 15 12 16   Temp: 98 F (36.7 C) 98.3 F (36.8 C) 98.3 F (36.8 C) 98 F (36.7 C)  TempSrc: Oral   Oral  SpO2: 98% 96% 97% 100%  Weight: 105.7 kg     Height: 5\' 5"  (1.651 m)      Physical Exam General: Well appearing female laying in bed comfortably on RA, in no acute distress Heart: RRR without murmurs, rubs, or gallops. Normal S1, S2 Lungs: CTA anteriorly Extremities: LLE bandaged up to shin with wound vac in place. 1+ pretibial edema bilaterally. RLE painful to palpation. Dialysis Access: LUE AVF +thrill, +bruit  Additional Objective Labs: Basic Metabolic Panel: Recent Labs  Lab 10/26/22 0056 10/27/22 0242 10/28/22 0253  NA 135 133* 133*  K 3.3* 3.7 3.9  CL 95* 97* 96*  CO2 25 22 26   GLUCOSE 136* 139* 144*  BUN 32* 44* 30*  CREATININE 7.84* 8.91* 6.51*  CALCIUM 8.9 8.3* 9.2  PHOS 5.3* 5.4* 4.4   Liver Function Tests: Recent Labs  Lab 10/26/22 0056 10/27/22 0242 10/28/22 0253  ALBUMIN 3.1* 3.0* 3.1*   No results for input(s): "LIPASE", "AMYLASE" in the last 168 hours. CBC: Recent Labs  Lab 10/23/22 0715 10/24/22 0307 10/26/22 0056 10/27/22 0242 10/28/22 0253  WBC 6.5 6.1 6.0 5.4 6.3  NEUTROABS  --   --  3.4 2.8 3.8  HGB 12.9 13.0 12.9 12.4 13.3  HCT 40.5 41.1 40.7 41.2 41.7  MCV 87.5 88.6 88.5 90.4 88.3  PLT 159 156 166 169 153   Blood Culture    Component Value Date/Time   SDES BLOOD RIGHT ARM 10/20/2022 1538   SPECREQUEST  10/20/2022 1538    BOTTLES DRAWN AEROBIC AND ANAEROBIC Blood Culture adequate volume   CULT  10/20/2022 1538    NO GROWTH 5  DAYS Performed at [facility], [address] [place], [place] [address]    REPTSTATUS 10/25/2022 FINAL 10/20/2022 1538   CBG: Recent Labs  Lab 10/27/22 1542 10/27/22 2137 10/28/22 0711 10/28/22 0959 10/28/22 1136  GLUCAP 177* 178* 136* 148* 142*   Medications:  sodium chloride 500 mL (10/28/22 1010)   cefTRIAXone (ROCEPHIN)  IV 2 g (10/28/22 1227)   vancomycin Stopped (10/27/22 1418)    (feeding supplement) PROSource Plus  30 mL Oral BID BM   amLODipine  5 mg Oral Daily   Chlorhexidine Gluconate Cloth  6 each Topical Daily   feeding supplement (NEPRO CARB STEADY)  237 mL Oral Q24H   fentaNYL       heparin  5,000 Units Subcutaneous Q8H   insulin aspart       insulin aspart  0-6 Units Subcutaneous TID WC   insulin detemir  20 Units Subcutaneous Daily   mupirocin ointment       pantoprazole  40 mg Oral Daily   potassium chloride  40 mEq Oral Daily   pregabalin  50 mg Oral BID   sucroferric oxyhydroxide  500 mg Oral TID WC   vancomycin  125 mg Oral QID    Dialysis Orders: TTS at [facility] -> last  HD 4/4, cut time - post-weight 113.5kg 4:15hr, 450/A1.5, EDW 111.5kg, 2K/2Ca, LUE AVF, heparin 3000 unit bolus - Mircera IV q 2 weeks - appears to have missed last dose, last given 3/21 - Venofer  IV weekly - Hectoral IV q HD     Assessment/Plan: AMS: ?Due to infection, doubt uremia. Head CT negative. Resolved, without altered mentation on examination. L foot osteomyelitis: Non-healing wound s/p L great toe amputation 09/08/22 - purulent drainage, x-Stroh suspicious for early osteo. Blood Cx collected, on Vanc + Ceftriaxone. Ortho has seen here, ABIs completed. 1st [place] amputated and L foot bandaged up with wound vac.  C.Diff colitis: Improving on PO Vancomycin.  ESRD: Had missed 2 HD prior to admit. Now back on track, continue TTS schedule - HD tomorrow. K 3.3->3.7-> 3.9 today - getting supplement. Hypertension/volume: BP improving, below her prior EDW. Will lower  at discharge. 6.  Anemia: Hgb > 12, no ESA needed for now. 7.  Metabolic bone disease: Ca controlled, Phos controlled. Holding VDRA for now. Restart home binder (Velphoro). 8.  Nutrition: Alb low, continue supplements for wound healing.  Patient seen by Melissa Montane and overseen by me.  Ozzie Hoyle, PA-C 10/28/2022, 12:38 PM  [facility]

## 2022-10-28 NOTE — Anesthesia Procedure Notes (Signed)
Procedure Name: MAC Date/Time: 10/28/2022 10:43 AM  Performed by: Zollie Beckers, CRNAPre-anesthesia Checklist: Patient identified, Emergency Drugs available, Suction available and Patient being monitored Patient Re-evaluated:Patient Re-evaluated prior to induction Oxygen Delivery Method: Nasal cannula Placement Confirmation: positive ETCO2

## 2022-10-28 NOTE — Anesthesia Postprocedure Evaluation (Signed)
Anesthesia Post Note  Patient: [name]  Procedure(s) Performed: LEFT FOOT 1ST [place] AMPUTATION (Left: Foot)     Patient location during evaluation: PACU Anesthesia Type: Regional Level of consciousness: awake and alert Pain management: pain level controlled Vital Signs Assessment: post-procedure vital signs reviewed and stable Respiratory status: spontaneous breathing, nonlabored ventilation, respiratory function stable and patient connected to nasal cannula oxygen Cardiovascular status: stable and blood pressure returned to baseline Postop Assessment: no apparent nausea or vomiting Anesthetic complications: no  No notable events documented.  Last Vitals:  Vitals:   10/28/22 1140 10/28/22 1219  BP: 138/62 (!) 133/53  Pulse: 79 74  Resp: 12 16  Temp: 36.8 C 36.7 C  SpO2: 97% 100%    Last Pain:  Vitals:   10/28/22 1219  TempSrc: Oral  PainSc:                  Ned Kakar S

## 2022-10-28 NOTE — Progress Notes (Signed)
Pt taken via bed with transporter from OR to go to OR for surgery on foot.  IV anbx sent and report called to short stay.

## 2022-10-28 NOTE — Interval H&P Note (Signed)
History and Physical Interval Note:  10/28/2022 6:53 AM  [name]  has presented today for surgery, with the diagnosis of Osteomyelitis Left Foot.  The various methods of treatment have been discussed with the patient and family. After consideration of risks, benefits and other options for treatment, the patient has consented to  Procedure(s): LEFT FOOT 1ST [place] AMPUTATION (Left) as a surgical intervention.  The patient's history has been reviewed, patient examined, no change in status, stable for surgery.  I have reviewed the patient's chart and labs.  Questions were answered to the patient's satisfaction.     Nadara Mustard

## 2022-10-28 NOTE — Anesthesia Procedure Notes (Signed)
Anesthesia Regional Block: Ankle block   Pre-Anesthetic Checklist: , timeout performed,  Correct Patient, Correct Site, Correct Laterality,  Correct Procedure, Correct Position, site marked,  Risks and benefits discussed,  Surgical consent,  Pre-op evaluation,  At surgeon's request and post-op pain management  Laterality: Left  Prep: chloraprep       Needles:  Injection technique: Single-shot  Needle Type: Other     Needle Length: 4cm  Needle Gauge: 25     Additional Needles:   Narrative:  Start time: 10/28/2022 10:20 AM End time: 10/28/2022 10:25 AM Injection made incrementally with aspirations every 5 mL.  Performed by: Personally  Anesthesiologist: Eilene Ghazi, MD  Additional Notes: Patient tolerated the procedure well without complications

## 2022-10-28 NOTE — Transfer of Care (Signed)
Immediate Anesthesia Transfer of Care Note  Patient: [name]  Procedure(s) Performed: LEFT FOOT 1ST [place] AMPUTATION (Left: Foot)  Patient Location: PACU  Anesthesia Type:MAC and Regional  Level of Consciousness: awake and alert   Airway & Oxygen Therapy: Patient Spontanous Breathing  Post-op Assessment: Report given to RN and Post -op Vital signs reviewed and stable  Post vital signs: Reviewed and stable  Last Vitals:  Vitals Value Taken Time  BP 147/68 10/28/22 1125  Temp    Pulse 86 10/28/22 1126  Resp 15 10/28/22 1126  SpO2 98 % 10/28/22 1126  Vitals shown include unvalidated device data.  Last Pain:  Vitals:   10/28/22 1003  TempSrc:   PainSc: 7       Patients Stated Pain Goal: 2 (10/27/22 1245)  Complications: No notable events documented.

## 2022-10-28 NOTE — Anesthesia Preprocedure Evaluation (Signed)
Anesthesia Evaluation  Patient identified by MRN, date of birth, ID band Patient awake    Reviewed: Allergy & Precautions, H&P , NPO status , Patient's Chart, lab work & pertinent test results  Airway Mallampati: II  TM Distance: >3 FB Neck ROM: Full    Dental no notable dental hx.    Pulmonary asthma , sleep apnea , COPD, former smoker   breath sounds clear to auscultation + decreased breath sounds      Cardiovascular hypertension, + CAD, + Past MI, + Cardiac Stents and + Peripheral Vascular Disease  Normal cardiovascular exam+ Valvular Problems/Murmurs AS  Rhythm:Regular Rate:Normal     Neuro/Psych negative neurological ROS  negative psych ROS   GI/Hepatic ,GERD  ,,(+) Hepatitis -, C  Endo/Other  diabetes  Morbid obesity  Renal/GU DialysisRenal disease  negative genitourinary   Musculoskeletal negative musculoskeletal ROS (+)    Abdominal  (+) + obese  Peds negative pediatric ROS (+)  Hematology negative hematology ROS (+)   Anesthesia Other Findings   Reproductive/Obstetrics negative OB ROS                             Anesthesia Physical Anesthesia Plan  ASA: 4  Anesthesia Plan: MAC   Post-op Pain Management: Regional block*   Induction: Intravenous  PONV Risk Score and Plan: 2 and Propofol infusion and Treatment may vary due to age or medical condition  Airway Management Planned: Simple Face Mask  Additional Equipment:   Intra-op Plan:   Post-operative Plan:   Informed Consent: I have reviewed the patients History and Physical, chart, labs and discussed the procedure including the risks, benefits and alternatives for the proposed anesthesia with the patient or authorized representative who has indicated his/her understanding and acceptance.     Dental advisory given  Plan Discussed with: CRNA and Surgeon  Anesthesia Plan Comments:        Anesthesia Quick  Evaluation

## 2022-10-28 NOTE — Op Note (Signed)
10/28/2022  11:16 AM  PATIENT:  [name]    PRE-OPERATIVE DIAGNOSIS:  Osteomyelitis Left Foot  POST-OPERATIVE DIAGNOSIS:  Same  PROCEDURE:  LEFT FOOT 1ST [place] AMPUTATION  Local tissue rearrangement for wound closure 10 x 4 cm. Application of Kerecis micro graft 38 cm. Application of Prevena wound VAC 13 cm.  SURGEON:  Nadara Mustard, MD  PHYSICIAN ASSISTANT:None ANESTHESIA:   General  PREOPERATIVE INDICATIONS:  [name] is a  67 y.o. female with a diagnosis of Osteomyelitis Left Foot who failed conservative measures and elected for surgical management.    The risks benefits and alternatives were discussed with the patient preoperatively including but not limited to the risks of infection, bleeding, nerve injury, cardiopulmonary complications, the need for revision surgery, among others, and the patient was willing to proceed.  OPERATIVE IMPLANTS:   Implant Name Type Inv. Item Serial No. Manufacturer Lot No. LRB No. Used Action  GRAFT SKIN WND MICRO 38 - [id number] Tissue GRAFT SKIN WND MICRO 38  [employer] [id number] Left 1 Implanted    @ENCIMAGES @  OPERATIVE FINDINGS: Patient had good petechial bleeding wound margins were clear.  OPERATIVE PROCEDURE: Patient was brought the operating room and underwent a general anesthesia.  After adequate levels anesthesia were obtained patient's left lower extremity was prepped using DuraPrep draped into a sterile field a timeout was called.  A racquet incision was made around the ulcerative tissue in the first Kreps.  This left a wound that was 10 x 4 cm.  The first Vandrunen was resected through the base of the first metatarsal.  Electrocautery was used for hemostasis.  The wound was irrigated with normal saline.  The wound bed was filled with 38 cm of Kerecis micro graft for wound surface area of 40 cm.  The tissue margins were undermined and local tissue rearrangement was used to close the wound 4 x 10 cm.  2-0 nylon was used for wound  closure.  A Prevena wound VAC was applied this had a good suction fit patient was taken the PACU in stable condition.   DISCHARGE PLANNING:  Antibiotic duration: Continue antibiotics for 24 hours  Weightbearing: Touchdown weightbearing on the left  Pain medication: Opioid pathway  Dressing care/ Wound VAC: Wound VAC continue for 1 week  Ambulatory devices: Walker  Discharge to: Patient may discharge to home when safe with therapy.  Follow-up: In the office 1 week post operative.

## 2022-10-28 NOTE — Progress Notes (Signed)
Pt received back to 1mw room 11 s/p surgery left foot.  Wound vac in place.  Pt denies pain or distress.  See assessment and vs's.  Sister at bs.  Nephro in to see.  Will continue to observe.

## 2022-10-28 NOTE — Progress Notes (Signed)
[facility]                                                                              [name], is a 67 y.o. female, DOB - [birth year], ZOX:[id number] Admit date - 10/20/2022    Outpatient Primary MD for the patient is [name], Heather Fiedler, FNP  LOS - 8  days  Chief Complaint  Patient presents with   Altered Mental Status       Brief summary   Patient is a 67 year old female with IDDM, ESRD on HD TTS, HLP, CAD, HTN, GERD, hep C, depression presented on 10/20/2022 with altered mental status.  Per family, patient had told them a day before the admission but she did not feel well.  She fell on the morning of admission and was not down for about 3 hours.  In ED, she was noted to have left foot wound.   Left foot x-[place] showed early possible osteomyelitis of first metatarsal head, pancolitis on the CT abdomen.   Assessment & Plan    Principal Problem:   Acute metabolic encephalopathy -CT head, CT C-spine negative for any abnormality -Overall, close to her baseline -Monitor closely for sedating meds or opioids  Active Problems: Osteomyelitis of the left foot -MRI of the foot showed medial forefoot soft tissue wound with underlying early osteomyelitis of the residually first metatarsal head and medial sesamoid, small abscess 1.0x 0.6x 0.5 cm -Orthopedics consulted.  ABIs within normal range -Patient underwent left foot first [place] amputation today by Dr. Lajoyce Corners -Currently on IV vancomycin and Rocephin -Plan for wound VAC for 1 week, touchdown weightbearing on the left, follow-up in office in 1 week -Will discuss with orthopedics if patient needs further antibiotics post op.  C. difficile colitis, pancolitis -Patient was on ciprofloxacin and Flagyl until 3/15 after great toe amputation -Oral vancomycin started on 4/11, per patient diarrhea has improved -Will discuss with orthopedics if patient needs further antibiotics post op.     COPD (chronic obstructive  pulmonary disease) -Currently stable, no acute wheezing     Essential hypertension -BP stable  ESRD on hemodialysis TTS -Nephrology following -Continue age 20 per schedule    Type 2 diabetes mellitus with chronic kidney disease, with long-term current use of insulin -Hemoglobin A1c 8.8 on 07/23/2022 -Continue Levemir 20 units daily, sensitive SSI CBG (last 3)  Recent Labs    10/28/22 0711 10/28/22 0959 10/28/22 1136  GLUCAP 136* 148* 142*    Hypertension -Outpatient on Coreg and midodrine, and patient placed on amlodipine  CAD, hyperlipidemia - resume statin     Chronic diastolic heart failure -No acute issues, monitor management with HD    PVD (peripheral vascular disease) -Will resume aspirin in a.m. -Resume statin    Prolonged QT interval -Avoid QT prolonging meds  Obesity Estimated body mass index is 38.77 kg/m as calculated from the following:   Height as of this encounter: 5\' 5"  (1.651 m).   Weight as of this encounter: 105.7 kg.  Code Status: Full code DVT Prophylaxis:  heparin injection 5,000 Units Start: 10/21/22 1400   Level of Care: Level of care: Telemetry Medical Family Communication:  Updated patient' Disposition Plan:      Remains inpatient appropriate: DC once cleared by orthopedics, will need to determine antibiotics course, PT evaluation   Procedures:  10/28/2022 left foot first [place] amputation  Consultants:   Orthopedics  Antimicrobials:   Anti-infectives (From admission, onward)    Start     Dose/Rate Route Frequency Ordered Stop   10/28/22 0947  ceFAZolin (ANCEF) 2-4 GM/100ML-% IVPB       Note to Pharmacy: [facility], [name]: cabinet override      10/28/22 0947 10/28/22 1049   10/28/22 0630  ceFAZolin (ANCEF) IVPB 2g/100 mL premix        2 g 200 mL/hr over 30 Minutes Intravenous On call to O.R. 10/28/22 0539 10/28/22 1045   10/28/22 0630  vancomycin (VANCOREADY) IVPB 1500 mg/300 mL        1,500 mg 150 mL/hr over 120 Minutes  Intravenous On call to O.R. 10/28/22 0539 10/28/22 1230   10/24/22 1200  cefTRIAXone (ROCEPHIN) 2 g in sodium chloride 0.9 % 100 mL IVPB        2 g 200 mL/hr over 30 Minutes Intravenous Every 24 hours 10/24/22 0952 10/29/22 2359   10/22/22 1800  vancomycin (VANCOCIN) capsule 125 mg        125 mg Oral 4 times daily 10/22/22 1654 11/12/22 1759   10/21/22 1530  vancomycin (VANCOCIN) IVPB 1000 mg/200 mL premix        1,000 mg 200 mL/hr over 60 Minutes Intravenous  Once 10/21/22 1439 10/21/22 1644   10/20/22 2000  vancomycin (VANCOCIN) IVPB 1000 mg/200 mL premix        1,000 mg 200 mL/hr over 60 Minutes Intravenous Every T-Th-Sa (Hemodialysis) 10/20/22 1841 10/29/22 2359   10/20/22 1900  piperacillin-tazobactam (ZOSYN) IVPB 2.25 g  Status:  Discontinued        2.25 g 100 mL/hr over 30 Minutes Intravenous Every 8 hours 10/20/22 1837 10/24/22 0952   10/20/22 1845  vancomycin (VANCOREADY) IVPB 2000 mg/400 mL        2,000 mg 200 mL/hr over 120 Minutes Intravenous  Once 10/20/22 1837 10/20/22 2250          Medications  (feeding supplement) PROSource Plus  30 mL Oral BID BM   amLODipine  5 mg Oral Daily   Chlorhexidine Gluconate Cloth  6 each Topical Daily   feeding supplement (NEPRO CARB STEADY)  237 mL Oral Q24H   fentaNYL       heparin  5,000 Units Subcutaneous Q8H   insulin aspart       insulin aspart  0-6 Units Subcutaneous TID WC   insulin detemir  20 Units Subcutaneous Daily   mupirocin ointment       pantoprazole  40 mg Oral Daily   potassium chloride  40 mEq Oral Daily   pregabalin  50 mg Oral BID   sucroferric oxyhydroxide  500 mg Oral TID WC   vancomycin  125 mg Oral QID      Subjective:   [name] was seen and examined today.  No acute complaints.  Seen in the morning, waiting for surgery today.  Patient denies dizziness, chest pain, shortness of breath, abdominal pain, N/V.  Per patient, diarrhea improved and normal consistency.  No fevers.    Objective:    Vitals:   10/28/22 1001 10/28/22 1125 10/28/22 1140 10/28/22 1219  BP: (!) 152/66 (!) 147/68 138/62 (!) 133/53  Pulse: 81 89 79 74  Resp: Temp: 98  F (36.7 C) 98.3 F (36.8 C) 98.3 F (36.8 C) 98 F (36.7 C)  TempSrc: Oral   Oral  SpO2: 98% 96% 97% 100%  Weight: 105.7 kg     Height:  (1.651 m)       Intake/Output Summary (Last 24 hours) at 10/28/2022 1341 Last data filed at 10/28/2022 1116 Gross per 24 hour  Intake 270 ml  Output --  Net 270 ml     Wt Readings from Last 3 Encounters:  10/28/22 105.7 kg  07/25/22 125.2 kg  05/26/22 114.3 kg     Exam General: Alert and oriented x 3, NAD Cardiovascular: S1 S2 auscultated,  RRR Respiratory: CTAB Gastrointestinal: Soft, nontender, nondistended, + bowel sounds Ext: no pedal edema bilaterally Neuro: no new deficits Skin: Dressing intact on the left foot Psych: Normal affect     Data Reviewed:  I have personally reviewed following labs    CBC Lab Results  Component Value Date   WBC 6.3 10/28/2022   RBC 4.72 10/28/2022   HGB 13.3 10/28/2022   HCT 41.7 10/28/2022   MCV 88.3 10/28/2022   MCH 28.2 10/28/2022   PLT 153 10/28/2022   MCHC 31.9 10/28/2022   RDW 16.7 (H) 10/28/2022   LYMPHSABS 1.4 10/28/2022   MONOABS 0.6 10/28/2022   EOSABS 0.4 10/28/2022   BASOSABS 0.0 10/28/2022     Last metabolic panel Lab Results  Component Value Date   NA 133 (L) 10/28/2022   K 3.9 10/28/2022   CL 96 (L) 10/28/2022   CO2 26 10/28/2022   BUN 30 (H) 10/28/2022   CREATININE 6.51 (H) 10/28/2022   GLUCOSE 144 (H) 10/28/2022   GFRNONAA 7 (L) 10/28/2022   GFRAA 6 (L) 01/31/2020   CALCIUM 9.2 10/28/2022   PHOS 4.4 10/28/2022   PROT 6.3 (L) 10/21/2022   ALBUMIN 3.1 (L) 10/28/2022   LABGLOB 2.8 09/27/2020   AGRATIO 1.4 09/27/2020   BILITOT 0.6 10/21/2022   ALKPHOS 233 (H) 10/21/2022   AST 10 (L) 10/21/2022   ALT 15 10/21/2022   ANIONGAP 11 10/28/2022    CBG (last 3)  Recent Labs     10/28/22 0711 10/28/22 0959 10/28/22 1136  GLUCAP 136* 148* 142*      Coagulation Profile: No results for input(s): "INR", "PROTIME" in the last 168 hours.   Radiology Studies: I have personally reviewed the imaging studies  No results found.     Thad Ranger M.D. [facility] 10/28/2022, 1:41 PM  Available via Epic secure chat 7am-7pm After 7 pm, please refer to night coverage provider listed on amion.

## 2022-10-29 ENCOUNTER — Encounter (HOSPITAL_COMMUNITY): Payer: Self-pay | Admitting: Orthopedic Surgery

## 2022-10-29 DIAGNOSIS — G9340 Encephalopathy, unspecified: Secondary | ICD-10-CM | POA: Diagnosis not present

## 2022-10-29 DIAGNOSIS — K5100 Ulcerative (chronic) pancolitis without complications: Secondary | ICD-10-CM | POA: Diagnosis not present

## 2022-10-29 DIAGNOSIS — M86172 Other acute osteomyelitis, left ankle and foot: Secondary | ICD-10-CM | POA: Diagnosis not present

## 2022-10-29 LAB — CBC
HCT: 41.3 % (ref 36.0–46.0)
Hemoglobin: 12.2 g/dL (ref 12.0–15.0)
MCH: 27.4 pg (ref 26.0–34.0)
MCHC: 29.5 g/dL — ABNORMAL LOW (ref 30.0–36.0)
MCV: 92.6 fL (ref 80.0–100.0)
Platelets: 155 10*3/uL (ref 150–400)
RBC: 4.46 MIL/uL (ref 3.87–5.11)
RDW: 16.7 % — ABNORMAL HIGH (ref 11.5–15.5)
WBC: 7.3 10*3/uL (ref 4.0–10.5)
nRBC: 0 % (ref 0.0–0.2)

## 2022-10-29 LAB — BASIC METABOLIC PANEL
Anion gap: 12 (ref 5–15)
BUN: 44 mg/dL — ABNORMAL HIGH (ref 8–23)
CO2: 23 mmol/L (ref 22–32)
Calcium: 9.1 mg/dL (ref 8.9–10.3)
Chloride: 99 mmol/L (ref 98–111)
Creatinine, Ser: 8 mg/dL — ABNORMAL HIGH (ref 0.44–1.00)
GFR, Estimated: 5 mL/min — ABNORMAL LOW (ref >60)
Glucose, Bld: 159 mg/dL — ABNORMAL HIGH (ref 70–99)
Potassium: 4.5 mmol/L (ref 3.5–5.1)
Sodium: 134 mmol/L — ABNORMAL LOW (ref 135–145)

## 2022-10-29 LAB — GLUCOSE, CAPILLARY
Glucose-Capillary: 193 mg/dL — ABNORMAL HIGH (ref 70–99)
Glucose-Capillary: 206 mg/dL — ABNORMAL HIGH (ref 70–99)
Glucose-Capillary: 147 mg/dL — ABNORMAL HIGH (ref 70–99)
Glucose-Capillary: 257 mg/dL — ABNORMAL HIGH (ref 70–99)

## 2022-10-29 MED ORDER — HYDROMORPHONE HCL 1 MG/ML IJ SOLN
1.0000 mg | INTRAMUSCULAR | Status: DC | PRN
  Filled 2022-10-29: qty 1

## 2022-10-29 MED ORDER — CHLORHEXIDINE GLUCONATE CLOTH 2 % EX PADS
6.0000 | MEDICATED_PAD | Freq: Every day | CUTANEOUS | Status: DC
  Administered 2022-10-30 – 2022-10-31 (×2): 6 via TOPICAL

## 2022-10-29 MED ORDER — HYDROMORPHONE HCL 1 MG/ML IJ SOLN
1.0000 mg | INTRAMUSCULAR | Status: DC | PRN
  Administered 2022-10-29: 1 mg via INTRAVENOUS
  Administered 2022-10-29: 2 mg via INTRAVENOUS
  Administered 2022-10-29: 1 mg via INTRAVENOUS
  Administered 2022-10-30 – 2022-11-01 (×7): 2 mg via INTRAVENOUS
  Filled 2022-10-29 (×5): qty 2
  Filled 2022-10-29: qty 1
  Filled 2022-10-29 (×4): qty 2

## 2022-10-29 MED ORDER — PREGABALIN 100 MG PO CAPS
100.0000 mg | ORAL_CAPSULE | Freq: Two times a day (BID) | ORAL | Status: DC
  Administered 2022-10-29 – 2022-11-01 (×6): 100 mg via ORAL
  Filled 2022-10-29 (×6): qty 1

## 2022-10-29 MED ORDER — OXYCODONE HCL 5 MG PO TABS
10.0000 mg | ORAL_TABLET | ORAL | Status: DC | PRN
  Administered 2022-10-29 – 2022-10-30 (×3): 10 mg via ORAL
  Filled 2022-10-29 (×3): qty 2

## 2022-10-29 MED ORDER — METHOCARBAMOL 500 MG PO TABS
500.0000 mg | ORAL_TABLET | Freq: Two times a day (BID) | ORAL | Status: DC
  Administered 2022-10-29 – 2022-11-01 (×7): 500 mg via ORAL
  Filled 2022-10-29 (×7): qty 1

## 2022-10-29 MED ORDER — PREGABALIN 25 MG PO CAPS
50.0000 mg | ORAL_CAPSULE | Freq: Once | ORAL | Status: AC
  Administered 2022-10-29: 50 mg via ORAL
  Filled 2022-10-29: qty 2

## 2022-10-29 NOTE — Progress Notes (Signed)
Patient ID: [name], female   DOB: [birth year], 67 y.o.   MRN: [id number] Patient is a 67 year old woman who is status post left foot first [place] amputation.  There is no drainage in the wound VAC canister.  Patient does complain of increased pain.  Patient has orders for both oxycodone and Dilaudid.

## 2022-10-29 NOTE — Progress Notes (Signed)
[facility]                                                                              [name], is a 67 y.o. female, DOB - 13-Sep-1955, SUP:[id number] Admit date - 10/20/2022    Outpatient Primary MD for the patient is Levan Hurst, Anastasia Fiedler, FNP  LOS - 9  days  Chief Complaint  Patient presents with   Altered Mental Status       Brief summary   Patient is a 67 year old female with IDDM, ESRD on HD TTS, HLP, CAD, HTN, GERD, hep C, depression presented on 10/20/2022 with altered mental status.  Per family, patient had told them a day before the admission but she did not feel well.  She fell on the morning of admission and was not down for about 3 hours.  In ED, she was noted to have left foot wound.   Left foot x-Hirt showed early possible osteomyelitis of first metatarsal head, pancolitis on the CT abdomen.   Assessment & Plan    Principal Problem:   Acute metabolic encephalopathy -CT head, CT C-spine negative for any abnormality -Overall, close to her baseline -Monitor closely for sedating meds or opioids  Active Problems: Osteomyelitis of the left foot -MRI of the foot showed medial forefoot soft tissue wound with underlying early osteomyelitis of the residually first metatarsal head and medial sesamoid, small abscess 1.0x 0.6x 0.5 cm -Orthopedics consulted.  ABIs within normal range - s/p left foot first Lorenzi amputation by Dr. Lajoyce Corners on 4/17, postop day #1 -Currently on IV vancomycin and Rocephin -Plan for wound VAC for 1 week, touchdown weightbearing on the left, follow-up in office in 1 week -d/w Dr Lajoyce Corners, continue antibiotics 24 hours postop -Complaining of significant pain 10/10 in left foot, increase  Lyrica 100 mg twice daily, increased oral oxycodone 10 mg every 4 hours as needed, discontinued oral Dilaudid due to increased risk of respiratory depression with the 2 narcotics.  Also added Robaxin 500 mg twice daily. -Continue IV Dilaudid as needed for  breakthrough or severe pain  C. difficile colitis, pancolitis -Patient was on ciprofloxacin and Flagyl until 3/15 after great toe amputation -Oral vancomycin started on 4/11, per patient diarrhea has improved -Will DC for 14 days of oral vancomycin at discharge once IV antibiotics are completed     COPD (chronic obstructive pulmonary disease) -Currently stable, no acute wheezing     Essential hypertension -BP stable  ESRD on hemodialysis TTS -Nephrology following    Type 2 diabetes mellitus with chronic kidney disease, with long-term current use of insulin -Hemoglobin A1c 8.8 on 07/23/2022 -Continue Levemir 20 units daily, sensitive SSI CBG (last 3)  Recent Labs    10/28/22 2025 10/29/22 0727 10/29/22 1106  GLUCAP 160* 193* 206*  -Add meal coverage  Hypertension -Outpatient on Coreg and midodrine, and patient placed on amlodipine  CAD, hyperlipidemia - resume statin     Chronic diastolic heart failure -No acute issues, monitor management with HD    PVD (peripheral vascular disease) -Will resume aspirin in a.m. -Resume statin    Prolonged QT interval -Avoid QT prolonging meds  Obesity  Estimated body mass index is 40.39 kg/m as calculated from the following:   Height as of this encounter:  (1.651 m).   Weight as of this encounter: 110.1 kg.  Code Status: Full code DVT Prophylaxis:  heparin injection 5,000 Units Start: 10/21/22 1400   Level of Care: Level of care: Telemetry Medical Family Communication: Updated patient' Disposition Plan:      Remains inpatient appropriate: Hopefully DC home tomorrow   Procedures:  10/28/2022 left foot first Graffius amputation  Consultants:   Orthopedics  Antimicrobials:   Anti-infectives (From admission, onward)    Start     Dose/Rate Route Frequency Ordered Stop   10/28/22 0947  ceFAZolin (ANCEF) 2-4 GM/100ML-% IVPB       Note to Pharmacy: [facility], [name]: cabinet override      10/28/22 0947 10/28/22 1049    10/28/22 0630  ceFAZolin (ANCEF) IVPB 2g/100 mL premix        2 g 200 mL/hr over 30 Minutes Intravenous On call to O.R. 10/28/22 0539 10/28/22 1045   10/28/22 0630  vancomycin (VANCOREADY) IVPB 1500 mg/300 mL        1,500 mg 150 mL/hr over 120 Minutes Intravenous On call to O.R. 10/28/22 0539 10/28/22 1230   10/24/22 1200  cefTRIAXone (ROCEPHIN) 2 g in sodium chloride 0.9 % 100 mL IVPB        2 g 200 mL/hr over 30 Minutes Intravenous Every 24 hours 10/24/22 0952 10/30/22 1759   10/22/22 1800  vancomycin (VANCOCIN) capsule 125 mg        125 mg Oral 4 times daily 10/22/22 1654 11/12/22 1759   10/21/22 1530  vancomycin (VANCOCIN) IVPB 1000 mg/200 mL premix        1,000 mg 200 mL/hr over 60 Minutes Intravenous  Once 10/21/22 1439 10/21/22 1644   10/20/22 2000  vancomycin (VANCOCIN) IVPB 1000 mg/200 mL premix        1,000 mg 200 mL/hr over 60 Minutes Intravenous Every T-Th-Sa (Hemodialysis) 10/20/22 1841 10/29/22 2359   10/20/22 1900  piperacillin-tazobactam (ZOSYN) IVPB 2.25 g  Status:  Discontinued        2.25 g 100 mL/hr over 30 Minutes Intravenous Every 8 hours 10/20/22 1837 10/24/22 0952   10/20/22 1845  vancomycin (VANCOREADY) IVPB 2000 mg/400 mL        2,000 mg 200 mL/hr over 120 Minutes Intravenous  Once 10/20/22 1837 10/20/22 2250          Medications  (feeding supplement) PROSource Plus  30 mL Oral BID BM   amLODipine  5 mg Oral Daily   atorvastatin  80 mg Oral QPM   Chlorhexidine Gluconate Cloth  6 each Topical Daily   Chlorhexidine Gluconate Cloth  6 each Topical Q0600   feeding supplement (NEPRO CARB STEADY)  237 mL Oral Q24H   fluticasone furoate-vilanterol  1 puff Inhalation Daily   heparin  5,000 Units Subcutaneous Q8H   insulin aspart  0-6 Units Subcutaneous TID WC   insulin detemir  20 Units Subcutaneous Daily   methocarbamol  500 mg Oral BID   pantoprazole  40 mg Oral Daily   pregabalin  100 mg Oral BID   sucroferric oxyhydroxide  500 mg Oral TID WC    vancomycin  125 mg Oral QID      Subjective:   [name] was seen and examined today.  Complaining of significant pain in the left foot to 10/10, sharp and throbbing.  Postop day #1.  Diarrhea improved.  Patient denies  dizziness, chest pain, shortness of breath, abdominal pain, N/V.    Objective:   Vitals:   10/29/22 1315 10/29/22 1330 10/29/22 1400 10/29/22 1430  BP: 117/63 (!) 145/66 128/74 (!) 118/59  Pulse: 85 80 84 82  Resp: 14 (!) Temp:      TempSrc:      SpO2: 95% 95% 96% 95%  Weight:      Height:        Intake/Output Summary (Last 24 hours) at 10/29/2022 1455 Last data filed at 10/29/2022 0859 Gross per 24 hour  Intake 540 ml  Output 0 ml  Net 540 ml     Wt Readings from Last 3 Encounters:  10/29/22 110.1 kg  07/25/22 125.2 kg  05/26/22 114.3 kg   Physical Exam General: Alert and oriented x 3, NAD Cardiovascular: S1 S2 clear, RRR.  Respiratory: CTAB, no wheezing Gastrointestinal: Soft, nontender, nondistended, NBS Ext: no pedal edema bilaterally Neuro: no new deficits Skin: Dressing intact on the left foot Psych: Normal affect     Data Reviewed:  I have personally reviewed following labs    CBC Lab Results  Component Value Date   WBC 7.3 10/29/2022   RBC 4.46 10/29/2022   HGB 12.2 10/29/2022   HCT 41.3 10/29/2022   MCV 92.6 10/29/2022   MCH 27.4 10/29/2022   PLT 155 10/29/2022   MCHC 29.5 (L) 10/29/2022   RDW 16.7 (H) 10/29/2022   LYMPHSABS 1.4 10/28/2022   MONOABS 0.6 10/28/2022   EOSABS 0.4 10/28/2022   BASOSABS 0.0 10/28/2022     Last metabolic panel Lab Results  Component Value Date   NA 134 (L) 10/29/2022   K 4.5 10/29/2022   CL 99 10/29/2022   CO2 23 10/29/2022   BUN 44 (H) 10/29/2022   CREATININE 8.00 (H) 10/29/2022   GLUCOSE 159 (H) 10/29/2022   GFRNONAA 5 (L) 10/29/2022   GFRAA 6 (L) 01/31/2020   CALCIUM 9.1 10/29/2022   PHOS 4.4 10/28/2022   PROT 6.3 (L) 10/21/2022   ALBUMIN 3.1 (L) 10/28/2022   LABGLOB  2.8 09/27/2020   AGRATIO 1.4 09/27/2020   BILITOT 0.6 10/21/2022   ALKPHOS 233 (H) 10/21/2022   AST 10 (L) 10/21/2022   ALT 15 10/21/2022   ANIONGAP 12 10/29/2022    CBG (last 3)  Recent Labs    10/28/22 2025 10/29/22 0727 10/29/22 1106  GLUCAP 160* 193* 206*      Coagulation Profile: No results for input(s): "INR", "PROTIME" in the last 168 hours.   Radiology Studies: I have personally reviewed the imaging studies  No results found.     Thad Ranger M.D. [facility] 10/29/2022, 2:55 PM  Available via Epic secure chat 7am-7pm After 7 pm, please refer to night coverage provider listed on amion.

## 2022-10-29 NOTE — Progress Notes (Signed)
   10/29/22 1745  Vitals  Temp 97.7 F (36.5 C)  Pulse Rate 87  Resp 13  BP (!) 141/56  SpO2 100 %  O2 Device Room Air  Oxygen Therapy  Patient Activity (if Appropriate) In bed  Pulse Oximetry Type Continuous  Post Treatment  Dialyzer Clearance Heavily streaked  Duration of HD Treatment -hour(s) 4 hour(s)  Liters Processed 96  Fluid Removed (mL) 2000 mL  Tolerated HD Treatment Yes  Post-Hemodialysis Comments HD tx completed without complications  AVG/AVF Arterial Site Held (minutes) 8 minutes  AVG/AVF Venous Site Held (minutes) 8 minutes   Received patient in bed to unit.  Alert and oriented.  Informed consent signed and in chart.   TX duration: 4HRS  Patient tolerated well.  Transported back to the room  Alert, without acute distress.  Hand-off given to patient's nurse.   Access used: LAVF Access issues: NONE  Total UF removed: 2L Medication(s) given: NONE    [facility]

## 2022-10-29 NOTE — Evaluation (Signed)
Physical Therapy Evaluation  Patient Details Name: [name] MRN: [id number] DOB: [birth year] Today's Date: 10/29/2022  History of Present Illness  Pt is a 67 y/o female who presents 10/20/22 from home after missed HD and fall at home. Pt with AMS and noted to have dehiscence great toe amputation on the L foot (~1 month ago performed in [place]). Pt now s/p L foot 1st Mikami amputation on 10/28/2022 per Dr. Lajoyce Corners. PMH significant for aortic stenosis, CAD, CHF, CKD on HD, DM, Hep C, HTN, MI.   Clinical Impression  Pt admitted with above diagnosis. Pt currently with functional limitations due to the deficits listed below (see PT Problem List). At the time of PT eval pt was able to perform transfers with up to heavy mod assist and RW for support. Pt had difficulty maintaining TDWB status on the LLE. DARCO shoe with forefoot cutout donned to assist with maintaining WB status. Pt reports she does not want to commit to going home at this time and will possibly like to explore post-acute rehab options depending on how she progresses. Pt will benefit from acute skilled PT to increase their independence and safety with mobility to allow discharge.          Recommendations for follow up therapy are one component of a multi-disciplinary discharge planning process, led by the attending physician.  Recommendations may be updated based on patient status, additional functional criteria and insurance authorization.  Follow Up Recommendations       Assistance Recommended at Discharge Frequent or constant Supervision/Assistance  Patient can return home with the following  A little help with walking and/or transfers;A little help with bathing/dressing/bathroom;Assistance with cooking/housework;Assist for transportation;Help with stairs or ramp for entrance    Equipment Recommendations BSC/3in1  Recommendations for Other Services       Functional Status Assessment Patient has had a recent decline in their  functional status and demonstrates the ability to make significant improvements in function in a reasonable and predictable amount of time.     Precautions / Restrictions Precautions Precautions: Fall Precaution Comments: Wound VAC Required Braces or Orthoses: Other Brace Other Brace: Has DARCO shoe with forefoot cut out Restrictions Weight Bearing Restrictions: Yes LLE Weight Bearing: Touchdown weight bearing      Mobility  Bed Mobility Overal bed mobility: Needs Assistance Bed Mobility: Supine to Sit     Supine to sit: Supervision     General bed mobility comments: HOB elevated. No assist required but close supervision required for safety. VC's to keep scooting forward until both feet were resting on the ground    Transfers Overall transfer level: Needs assistance Equipment used: Rolling walker (2 wheels) Transfers: Sit to/from Stand, Bed to chair/wheelchair/BSC Sit to Stand: Mod assist   Step pivot transfers: Mod assist       General transfer comment: Heavy mod assist required for power up to full stand as well as for segmental turn around to the chair. Pt reports not putting weight through her L foot however appears to be bearing weight through the L foot throughout transfer. Pt did have DARCO shoe donned throughout.    Ambulation/Gait               General Gait Details: Unable to progress ambulation  Stairs            Wheelchair Mobility    Modified Rankin (Stroke Patients Only)       Balance  Pertinent Vitals/Pain Pain Assessment Pain Assessment: Faces Faces Pain Scale: Hurts even more Pain Location: L foot Pain Descriptors / Indicators: Operative site guarding, Sore, Discomfort, Grimacing Pain Intervention(s): Limited activity within patient's tolerance, Monitored during session, Repositioned    Home Living Family/patient expects to be discharged to:: Private  residence Living Arrangements: Alone Available Help at Discharge: Family;Available 24 hours/day (25 y/o Grandson) Type of Home: Apartment Home Access: Level entry       Home Layout: One level Home Equipment: Agricultural consultant (2 wheels);Rollator (4 wheels);Cane - single point      Prior Function Prior Level of Function : Needs assist             Mobility Comments: Cane vs RW vs Rollator       Hand Dominance   Dominant Hand: Right    Extremity/Trunk Assessment   Upper Extremity Assessment Upper Extremity Assessment: Defer to OT evaluation    Lower Extremity Assessment Lower Extremity Assessment: LLE deficits/detail LLE Deficits / Details: Decreased strength, AROM, and acute pain consistent with above mentioned surgery.    Cervical / Trunk Assessment Cervical / Trunk Assessment: Other exceptions Cervical / Trunk Exceptions: Forward head posture with rounded shoulders  Communication   Communication: No difficulties  Cognition Arousal/Alertness: Awake/alert Behavior During Therapy: WFL for tasks assessed/performed Overall Cognitive Status: Within Functional Limits for tasks assessed                                          General Comments      Exercises     Assessment/Plan    PT Assessment Patient needs continued PT services  PT Problem List Decreased strength;Decreased range of motion;Decreased activity tolerance;Decreased balance;Decreased mobility;Decreased knowledge of use of DME;Decreased safety awareness;Decreased knowledge of precautions;Pain       PT Treatment Interventions DME instruction;Gait training;Functional mobility training;Therapeutic activities;Therapeutic exercise;Balance training;Patient/family education    PT Goals (Current goals can be found in the Care Plan section)  Acute Rehab PT Goals Patient Stated Goal: Home at d/c PT Goal Formulation: With patient Time For Goal Achievement: 11/05/22 Potential to Achieve  Goals: Good    Frequency Min 3X/week     Co-evaluation               AM-PAC PT "6 Clicks" Mobility  Outcome Measure Help needed turning from your back to your side while in a flat bed without using bedrails?: A Little Help needed moving from lying on your back to sitting on the side of a flat bed without using bedrails?: A Little Help needed moving to and from a bed to a chair (including a wheelchair)?: A Lot Help needed standing up from a chair using your arms (e.g., wheelchair or bedside chair)?: A Lot Help needed to walk in hospital room?: Total Help needed climbing 3-5 steps with a railing? : Total 6 Click Score: 12    End of Session Equipment Utilized During Treatment: Gait belt Activity Tolerance: Patient tolerated treatment well Patient left: in chair;with call bell/phone within reach;with chair alarm set Nurse Communication: Mobility status PT Visit Diagnosis: Unsteadiness on feet (R26.81);Pain Pain - Right/Left: Left Pain - part of body: Ankle and joints of foot    Time: 1152-1220 PT Time Calculation (min) (ACUTE ONLY): 28 min   Charges:   PT Evaluation $PT Eval Moderate Complexity: 1 Mod PT Treatments $Gait Training: 8-22 mins  Conni Slipper, PT, DPT Acute Rehabilitation Services Secure Chat Preferred Office: [phone]   Marylynn Pearson 10/29/2022, 1:51 PM

## 2022-10-29 NOTE — TOC Progression Note (Addendum)
Transition of Care [facility]) - Progression Note    Patient Details  Name: [name] MRN: [id number] Date of Birth: 25-Jan-1956  [facility] CM/SW Contact  Tom-Johnson, Hershal Coria, RN Phone Number: 10/29/2022, 3:25 PM  Clinical Narrative:     Home Health PT recommended. CM called patient's room and phone number listed with no response. CM called and spoke with daughter, Myrtis Ser at [phone]). Myrtis Ser states patient was active with home health in [place] but could not remember name of agency. Will give name and contact of agency when retrieved.   Patient is s/p Left Foot first Winberg amputation with wound vac placement by Dr. Lajoyce Corners yesterday, 10/28/22, currently on IV abx. Pain management continues.   CM will continue to follow as patient progresses with care towards discharge.       Expected Discharge Plan:  (to be determined) Barriers to Discharge: Continued Medical Work up  Expected Discharge Plan and Services       Living arrangements for the past 2 months: Apartment                                       Social Determinants of Health (SDOH) Interventions SDOH Screenings   Food Insecurity: Food Insecurity Present (10/22/2022)  Housing: Low Risk  (07/23/2022)  Transportation Needs: No Transportation Needs (10/22/2022)  Utilities: Not At Risk (10/22/2022)  Alcohol Screen: Low Risk  (03/03/2021)  Depression (PHQ2-9): High Risk (03/03/2021)  Financial Resource Strain: Low Risk  (12/26/2019)  Physical Activity: Inactive (12/26/2019)  Social Connections: Socially Isolated (12/26/2019)  Stress: Stress Concern Present (12/26/2019)  Tobacco Use: Medium Risk (10/29/2022)    Readmission Risk Interventions     No data to display

## 2022-10-29 NOTE — Progress Notes (Signed)
[facility] Progress Note   Subjective:  Pt seen while in hospital room, still reporting pain- expected post surgically. Denies CP/SOB. Otherwise pt reports no other complaints at this time.  Objective Vitals:   10/28/22 2023 10/29/22 0431 10/29/22 0701 10/29/22 0830  BP: (!) 150/64 (!) 166/65  (!) 134/58  Pulse: 81 85  94  Resp: 18 18  15   Temp: 98.3 F (36.8 C) 98.1 F (36.7 C)  97.9 F (36.6 C)  TempSrc: Oral Oral  Oral  SpO2: 100% 100%  96%  Weight:   110.2 kg   Height:       Physical Exam General: Well appearing female laying in bed comfortably on RA, in no acute distress. Heart: RRR without murmurs, rubs, or gallops. Normal S1, S2 Lungs: CTA anteriorly Abdomen: No pain to palpation. Extremities: LLE bandaged up to shin with wound wound vac in place. 1+ pretibial edema bilaterally, 1+ R pedal edema. Pain to palpation of Bilateral LE. Dialysis Access: LUE AVF +thrill, +bruit  Additional Objective Labs: Basic Metabolic Panel: Recent Labs  Lab 10/26/22 0056 10/27/22 0242 10/28/22 0253 10/29/22 0233  NA 135 133* 133* 134*  K 3.3* 3.7 3.9 4.5  CL 95* 97* 96* 99  CO2 25 22 26 23   GLUCOSE 136* 139* 144* 159*  BUN 32* 44* 30* 44*  CREATININE 7.84* 8.91* 6.51* 8.00*  CALCIUM 8.9 8.3* 9.2 9.1  PHOS 5.3* 5.4* 4.4  --    Liver Function Tests: Recent Labs  Lab 10/26/22 0056 10/27/22 0242 10/28/22 0253  ALBUMIN 3.1* 3.0* 3.1*   CBC: Recent Labs  Lab 10/24/22 0307 10/26/22 0056 10/27/22 0242 10/28/22 0253 10/29/22 0233  WBC 6.1 6.0 5.4 6.3 7.3  NEUTROABS  --  3.4 2.8 3.8  --   HGB 13.0 12.9 12.4 13.3 12.2  HCT 41.1 40.7 41.2 41.7 41.3  MCV 88.6 88.5 90.4 88.3 92.6  PLT 156 166 169 153 155   Blood Culture    Component Value Date/Time   SDES BLOOD RIGHT ARM 10/20/2022 1538   SPECREQUEST  10/20/2022 1538    BOTTLES DRAWN AEROBIC AND ANAEROBIC Blood Culture adequate volume   CULT  10/20/2022 1538    NO GROWTH 5 DAYS Performed at [facility], [address], [place], [place] [address]    REPTSTATUS 10/25/2022 FINAL 10/20/2022 1538   CBG: Recent Labs  Lab 10/28/22 0959 10/28/22 1136 10/28/22 1536 10/28/22 2025 10/29/22 0727  GLUCAP 148* 142* 155* 160* 193*   Medications:  sodium chloride 500 mL (10/28/22 1010)   cefTRIAXone (ROCEPHIN)  IV 2 g (10/28/22 1227)   vancomycin Stopped (10/27/22 1418)    (feeding supplement) PROSource Plus  30 mL Oral BID BM   amLODipine  5 mg Oral Daily   atorvastatin  80 mg Oral QPM   Chlorhexidine Gluconate Cloth  6 each Topical Daily   feeding supplement (NEPRO CARB STEADY)  237 mL Oral Q24H   fluticasone furoate-vilanterol  1 puff Inhalation Daily   heparin  5,000 Units Subcutaneous Q8H   insulin aspart  0-6 Units Subcutaneous TID WC   insulin detemir  20 Units Subcutaneous Daily   methocarbamol  500 mg Oral BID   pantoprazole  40 mg Oral Daily   potassium chloride  40 mEq Oral Daily   pregabalin  100 mg Oral BID   pregabalin  50 mg Oral Once   sucroferric oxyhydroxide  500 mg Oral TID WC   vancomycin  125 mg Oral QID  Dialysis Orders: TTS at [facility] -> last HD 4/4, cut time - post-weight 113.5kg 4:15hr, 450/A1.5, EDW 111.5kg, 2K/2Ca, LUE AVF, heparin 3000 unit bolus - Mircera IV q 2 weeks - appears to have missed last dose, last given 3/21 - Venofer  IV weekly - Hectoral IV q HD    Assessment/Plan: AMS: On admit. Head CT negative. Resolved, without altered mentation on examination. L foot osteomyelitis: Non-healing wound s/p L great toe amputation 09/08/22 - purulent drainage, x-Vallely suspicious for early osteo. Blood Cx collected, on Vanc + Ceftriaxone. Ortho has seen here, ABIs completed. 1st Canipe amputated and L foot bandaged up with wound vac- stable.  C.Diff colitis: Improving on PO Vancomycin- without complaints.  ESRD: Had missed 2 HD prior to admit. Now back on track, continue TTS schedule - HD today. K 3.3->3.7-> 3.9->4.5 today - stop supplement.  Continue to use 3-4K bath on dialysis. Hypertension/volume: BP improving, below her prior EDW. Will lower at discharge. 6.  Anemia: Hgb > 12, no ESA needed for now. 7.  Metabolic bone disease: Ca controlled, Phos controlled. Holding VDRA for now. Restart home binder (Velphoro). 8.  Nutrition: Alb low, continue supplements for wound healing.  Gerilyn Pilgrim Vineet Kinney, PA-S  Ozzie Hoyle, PA-C 10/29/2022, 9:40 AM  [employer]

## 2022-10-30 DIAGNOSIS — K5100 Ulcerative (chronic) pancolitis without complications: Secondary | ICD-10-CM | POA: Diagnosis not present

## 2022-10-30 DIAGNOSIS — G9340 Encephalopathy, unspecified: Secondary | ICD-10-CM | POA: Diagnosis not present

## 2022-10-30 DIAGNOSIS — M86172 Other acute osteomyelitis, left ankle and foot: Secondary | ICD-10-CM | POA: Diagnosis not present

## 2022-10-30 LAB — CBC
HCT: 40 % (ref 36.0–46.0)
Hemoglobin: 12.7 g/dL (ref 12.0–15.0)
MCH: 28.3 pg (ref 26.0–34.0)
MCHC: 31.8 g/dL (ref 30.0–36.0)
MCV: 89.3 fL (ref 80.0–100.0)
Platelets: 146 10*3/uL — ABNORMAL LOW (ref 150–400)
RBC: 4.48 MIL/uL (ref 3.87–5.11)
RDW: 16.6 % — ABNORMAL HIGH (ref 11.5–15.5)
WBC: 9.1 10*3/uL (ref 4.0–10.5)
nRBC: 0 % (ref 0.0–0.2)

## 2022-10-30 LAB — BASIC METABOLIC PANEL
Anion gap: 16 — ABNORMAL HIGH (ref 5–15)
BUN: 26 mg/dL — ABNORMAL HIGH (ref 8–23)
CO2: 26 mmol/L (ref 22–32)
Calcium: 9.4 mg/dL (ref 8.9–10.3)
Chloride: 92 mmol/L — ABNORMAL LOW (ref 98–111)
Creatinine, Ser: 5.75 mg/dL — ABNORMAL HIGH (ref 0.44–1.00)
GFR, Estimated: 8 mL/min — ABNORMAL LOW (ref >60)
Glucose, Bld: 213 mg/dL — ABNORMAL HIGH (ref 70–99)
Potassium: 4.7 mmol/L (ref 3.5–5.1)
Sodium: 134 mmol/L — ABNORMAL LOW (ref 135–145)

## 2022-10-30 LAB — GLUCOSE, CAPILLARY
Glucose-Capillary: 219 mg/dL — ABNORMAL HIGH (ref 70–99)
Glucose-Capillary: 189 mg/dL — ABNORMAL HIGH (ref 70–99)
Glucose-Capillary: 228 mg/dL — ABNORMAL HIGH (ref 70–99)
Glucose-Capillary: 215 mg/dL — ABNORMAL HIGH (ref 70–99)

## 2022-10-30 MED ORDER — INSULIN ASPART 100 UNIT/ML IJ SOLN
0.0000 [IU] | Freq: Three times a day (TID) | INTRAMUSCULAR | Status: DC
  Administered 2022-10-30: 5 [IU] via SUBCUTANEOUS
  Administered 2022-10-31 (×3): 3 [IU] via SUBCUTANEOUS
  Administered 2022-11-01: 2 [IU] via SUBCUTANEOUS
  Administered 2022-11-01: 5 [IU] via SUBCUTANEOUS

## 2022-10-30 MED ORDER — HEPARIN SODIUM (PORCINE) 1000 UNIT/ML DIALYSIS
1000.0000 [IU] | INTRAMUSCULAR | Status: DC | PRN
  Filled 2022-10-30: qty 1

## 2022-10-30 MED ORDER — ASPIRIN 81 MG PO TBEC
81.0000 mg | DELAYED_RELEASE_TABLET | Freq: Every day | ORAL | Status: DC
  Administered 2022-10-30 – 2022-11-01 (×3): 81 mg via ORAL
  Filled 2022-10-30 (×3): qty 1

## 2022-10-30 MED ORDER — ALTEPLASE 2 MG IJ SOLR
2.0000 mg | Freq: Once | INTRAMUSCULAR | Status: DC | PRN
  Filled 2022-10-30: qty 2

## 2022-10-30 MED ORDER — LIDOCAINE HCL (PF) 1 % IJ SOLN: 5.0000 mL | INTRAMUSCULAR | Status: DC | PRN

## 2022-10-30 MED ORDER — LIDOCAINE-PRILOCAINE 2.5-2.5 % EX CREA
1.0000 | TOPICAL_CREAM | CUTANEOUS | Status: DC | PRN
  Filled 2022-10-30: qty 5

## 2022-10-30 MED ORDER — PENTAFLUOROPROP-TETRAFLUOROETH EX AERO: 1.0000 | INHALATION_SPRAY | CUTANEOUS | Status: DC | PRN

## 2022-10-30 NOTE — Progress Notes (Signed)
Physical Therapy Treatment Patient Details Name: [name] MRN: [id number] DOB: [birth year] Today's Date: 10/30/2022   History of Present Illness Pt is a 67 y/o female who presents 10/20/22 from home after missed HD and fall at home. Pt with AMS and noted to have dehiscence great toe amputation on the L foot (~1 month ago performed in [place]). Pt now s/p L foot 1st [place] amputation on 10/28/2022 per Dr. Lajoyce Corners. PMH significant for aortic stenosis, CAD, CHF, CKD on HD, DM, Hep C, HTN, MI.    PT Comments    Pt greeted up in chair, motivated for session and with continued progress towards acute goals. Pt able to come to stand x2 trials with mod A initially down to min A on second trial and RW support. Pt able to maintain standing with RW support and min guard for safety with good adherence to precautions and complete pre-gait activities in standing with cues for technique. Pt continues to benefit from skilled PT services to progress toward functional mobility goals.    Recommendations for follow up therapy are one component of a multi-disciplinary discharge planning process, led by the attending physician.  Recommendations may be updated based on patient status, additional functional criteria and insurance authorization.  Follow Up Recommendations       Assistance Recommended at Discharge Frequent or constant Supervision/Assistance  Patient can return home with the following A little help with walking and/or transfers;A little help with bathing/dressing/bathroom;Assistance with cooking/housework;Assist for transportation;Help with stairs or ramp for entrance   Equipment Recommendations  BSC/3in1    Recommendations for Other Services       Precautions / Restrictions Precautions Precautions: Fall Precaution Comments: Wound VAC--prevena Required Braces or Orthoses: Other Brace Other Brace: Has DARCO shoe with forefoot cut out Restrictions Weight Bearing Restrictions: Yes LLE Weight  Bearing: Touchdown weight bearing     Mobility  Bed Mobility Overal bed mobility: Needs Assistance             General bed mobility comments: pt up in chair on arrival    Transfers Overall transfer level: Needs assistance Equipment used: Rolling walker (2 wheels) Transfers: Sit to/from Stand, Bed to chair/wheelchair/BSC Sit to Stand: Min assist, Mod assist           General transfer comment: DARCO shoe on L and sneaker on R, cues for technique and to take weight through UEs to maintain TDWB on L LE, assist to rise and steady able to complete x2    Ambulation/Gait             Pre-gait activities: marching LLE x20 in standing General Gait Details: unable to progress   Stairs             Wheelchair Mobility    Modified Rankin (Stroke Patients Only)       Balance Overall balance assessment: Needs assistance   Sitting balance-Leahy Scale: Good     Standing balance support: Bilateral upper extremity supported Standing balance-Leahy Scale: Poor Standing balance comment: heavy relinace on UE support                            Cognition Arousal/Alertness: Awake/alert Behavior During Therapy: WFL for tasks assessed/performed Overall Cognitive Status: Within Functional Limits for tasks assessed  Exercises General Exercises - Lower Extremity Long Arc Quad: Left, 10 reps, Seated Hip Flexion/Marching: Left, 10 reps, Seated    General Comments        Pertinent Vitals/Pain Pain Assessment Pain Assessment: Faces Faces Pain Scale: Hurts little more Pain Location: L foot Pain Descriptors / Indicators: Operative site guarding, Discomfort Pain Intervention(s): Monitored during session, RN gave pain meds during session, Limited activity within patient's tolerance    Home Living Family/patient expects to be discharged to:: Private residence Living Arrangements: Alone Available Help  at Discharge: Family;Available 24 hours/day (50 year old grandson) Type of Home: Apartment Home Access: Level entry       Home Layout: One level Home Equipment: Agricultural consultant (2 wheels);Rollator (4 wheels);Cane - single point      Prior Function            PT Goals (current goals can now be found in the care plan section) Acute Rehab PT Goals PT Goal Formulation: With patient Time For Goal Achievement: 11/05/22 Progress towards PT goals: Progressing toward goals    Frequency    Min 3X/week      PT Plan      Co-evaluation              AM-PAC PT "6 Clicks" Mobility   Outcome Measure  Help needed turning from your back to your side while in a flat bed without using bedrails?: A Little Help needed moving from lying on your back to sitting on the side of a flat bed without using bedrails?: A Little Help needed moving to and from a bed to a chair (including a wheelchair)?: A Lot Help needed standing up from a chair using your arms (e.g., wheelchair or bedside chair)?: A Lot Help needed to walk in hospital room?: Total Help needed climbing 3-5 steps with a railing? : Total 6 Click Score: 12    End of Session   Activity Tolerance: Patient tolerated treatment well Patient left: in chair;with call bell/phone within reach;with chair alarm set Nurse Communication: Mobility status PT Visit Diagnosis: Unsteadiness on feet (R26.81);Pain Pain - Right/Left: Left Pain - part of body: Ankle and joints of foot     Time: 1610-9604 PT Time Calculation (min) (ACUTE ONLY): 20 min  Charges:  $Therapeutic Activity: 8-22 mins                    Laritza Vokes R. PTA Acute Rehabilitation Services Office: [phone]    Catalina Antigua 10/30/2022, 2:44 PM

## 2022-10-30 NOTE — TOC Progression Note (Signed)
Transition of Care [facility]) - Progression Note    Patient Details  Name: [name] MRN: [id number] Date of Birth: Oct 17, 1955  Transition of [facility]) CM/SW Contact  Tom-Johnson, Hershal Coria, RN Phone Number: 10/30/2022, 4:38 PM  Clinical Narrative:     Patient continues on oral abx till discharge. Has the Prevena wound vac for 1 week and will f/u outpatient at MD's office. On touchdown weightbearing restrictions on the left foot. Patient states she is active with [facility]. CM called and spoke with Leotis Shames [phone]) and she states patient has a start of care date for 11/01/22 for PT/OT/RN/SW disciplines and does not need a new order. CM will continue to follow with care.     Expected Discharge Plan:  (to be determined) Barriers to Discharge: Continued Medical Work up  Expected Discharge Plan and Services       Living arrangements for the past 2 months: Apartment                                       Social Determinants of Health (SDOH) Interventions SDOH Screenings   Food Insecurity: Food Insecurity Present (10/22/2022)  Housing: Low Risk  (07/23/2022)  Transportation Needs: No Transportation Needs (10/22/2022)  Utilities: Not At Risk (10/22/2022)  Alcohol Screen: Low Risk  (03/03/2021)  Depression (PHQ2-9): High Risk (03/03/2021)  Financial Resource Strain: Low Risk  (12/26/2019)  Physical Activity: Inactive (12/26/2019)  Social Connections: Socially Isolated (12/26/2019)  Stress: Stress Concern Present (12/26/2019)  Tobacco Use: Medium Risk (10/29/2022)    Readmission Risk Interventions     No data to display

## 2022-10-30 NOTE — Progress Notes (Signed)
[facility]                                                                              [name], is a 67 y.o. female, DOB - 12/08/1955, ZOX:[id number] Admit date - 10/20/2022    Outpatient Primary MD for the patient is Levan Hurst, Anastasia Fiedler, FNP  LOS - 10  days  Chief Complaint  Patient presents with   Altered Mental Status       Brief summary   Patient is a 67 year old female with IDDM, ESRD on HD TTS, HLP, CAD, HTN, GERD, hep C, depression presented on 10/20/2022 with altered mental status.  Per family, patient had told them a day before the admission but she did not feel well.  She fell on the morning of admission and was not down for about 3 hours.  In ED, she was noted to have left foot wound.   Left foot x-[place] showed early possible osteomyelitis of first metatarsal head, pancolitis on the CT abdomen.   Assessment & Plan    Principal Problem:   Acute metabolic encephalopathy -CT head, CT C-spine negative for any abnormality -Resolved, alert and oriented, appears at her baseline  Active Problems: Osteomyelitis of the left foot -MRI of the foot showed medial forefoot soft tissue wound with underlying early osteomyelitis of the residually first metatarsal head and medial sesamoid, small abscess 1.0x 0.6x 0.5 cm -Orthopedics consulted.  ABIs within normal range - s/p left foot first [place] amputation by Dr. Lajoyce Corners on 4/17, postop day #2 -Completed IV vancomycin and Rocephin -Plan for wound VAC for 1 week, touchdown weightbearing on the left, follow-up in office in 1 week -States current pain regimen is working  C. difficile colitis, pancolitis -Patient was on ciprofloxacin and Flagyl until 3/15 after great toe amputation -Oral vancomycin started on 4/11, per patient diarrhea has improved -Will DC for 14 days of oral vancomycin at discharge    COPD (chronic obstructive pulmonary disease) -Currently stable, no acute wheezing     Essential hypertension -BP  stable  ESRD on hemodialysis TTS -Nephrology following    Type 2 diabetes mellitus with chronic kidney disease, with long-term current use of insulin -Hemoglobin A1c 8.8 on 07/23/2022 CBG (last 3)  Recent Labs    10/29/22 2211 10/30/22 0754 10/30/22 1123  GLUCAP 257* 219* 189*  -Continue Levemir 20 units daily, increase SSI to moderate  Hypertension -Outpatient on Coreg and midodrine, and patient placed on amlodipine  CAD, hyperlipidemia - resume statin     Chronic diastolic heart failure -No acute issues, monitor management with HD    PVD (peripheral vascular disease) -Aspirin and statin resumed    Prolonged QT interval -Avoid QT prolonging meds  Obesity Estimated body mass index is 40.1 kg/m as calculated from the following:   Height as of this encounter: 5\' 5"  (1.651 m).   Weight as of this encounter: 109.3 kg.  Code Status: Full code DVT Prophylaxis:  heparin injection 5,000 Units Start: 10/21/22 1400   Level of Care: Level of care: Telemetry Medical Family Communication: Updated patient' Disposition Plan:      Remains inpatient appropriate: Hopefully DC home  tomorrow if cleared by orthopedics.  Continue to work with PT   Procedures:  10/28/2022 left foot first [place] amputation  Consultants:   Orthopedics  Antimicrobials:   Anti-infectives (From admission, onward)    Start     Dose/Rate Route Frequency Ordered Stop   10/28/22 0947  ceFAZolin (ANCEF) 2-4 GM/100ML-% IVPB       Note to Pharmacy: [facility], [name]: cabinet override      10/28/22 0947 10/28/22 1049   10/28/22 0630  ceFAZolin (ANCEF) IVPB 2g/100 mL premix        2 g 200 mL/hr over 30 Minutes Intravenous On call to O.R. 10/28/22 0539 10/28/22 1045   10/28/22 0630  vancomycin (VANCOREADY) IVPB 1500 mg/300 mL        1,500 mg 150 mL/hr over 120 Minutes Intravenous On call to O.R. 10/28/22 0539 10/28/22 1230   10/24/22 1200  cefTRIAXone (ROCEPHIN) 2 g in sodium chloride 0.9 % 100 mL IVPB        2  g 200 mL/hr over 30 Minutes Intravenous Every 24 hours 10/24/22 0952 10/29/22 2119   10/22/22 1800  vancomycin (VANCOCIN) capsule 125 mg        125 mg Oral 4 times daily 10/22/22 1654 11/12/22 1759   10/21/22 1530  vancomycin (VANCOCIN) IVPB 1000 mg/200 mL premix        1,000 mg 200 mL/hr over 60 Minutes Intravenous  Once 10/21/22 1439 10/21/22 1644   10/20/22 2000  vancomycin (VANCOCIN) IVPB 1000 mg/200 mL premix        1,000 mg 200 mL/hr over 60 Minutes Intravenous Every T-Th-Sa (Hemodialysis) 10/20/22 1841 10/29/22 2232   10/20/22 1900  piperacillin-tazobactam (ZOSYN) IVPB 2.25 g  Status:  Discontinued        2.25 g 100 mL/hr over 30 Minutes Intravenous Every 8 hours 10/20/22 1837 10/24/22 0952   10/20/22 1845  vancomycin (VANCOREADY) IVPB 2000 mg/400 mL        2,000 mg 200 mL/hr over 120 Minutes Intravenous  Once 10/20/22 1837 10/20/22 2250          Medications  (feeding supplement) PROSource Plus  30 mL Oral BID BM   amLODipine  5 mg Oral Daily   atorvastatin  80 mg Oral QPM   Chlorhexidine Gluconate Cloth  6 each Topical Daily   Chlorhexidine Gluconate Cloth  6 each Topical Q0600   feeding supplement (NEPRO CARB STEADY)  237 mL Oral Q24H   fluticasone furoate-vilanterol  1 puff Inhalation Daily   heparin  5,000 Units Subcutaneous Q8H   insulin aspart  0-6 Units Subcutaneous TID WC   insulin detemir  20 Units Subcutaneous Daily   methocarbamol  500 mg Oral BID   pantoprazole  40 mg Oral Daily   pregabalin  100 mg Oral BID   sucroferric oxyhydroxide  500 mg Oral TID WC   vancomycin  125 mg Oral QID      Subjective:   [name] was seen and examined today.  Pain much better controlled today.  Postop day #2.  Patient does not feel ready to go home today however would prefer to go home with PT and not to any rehab.    Objective:   Vitals:   10/29/22 1847 10/29/22 2215 10/30/22 0500 10/30/22 0909  BP: (!) 132/57 (!) 131/49  138/66  Pulse: 93 94  88  Resp: Temp: 98.4 F (36.9 C) 98.2 F (36.8 C)  98.2 F (36.8 C)  TempSrc: Oral  Oral  Oral  SpO2: 99% 99%  96%  Weight:   109.3 kg   Height:        Intake/Output Summary (Last 24 hours) at 10/30/2022 1323 Last data filed at 10/29/2022 1745 Gross per 24 hour  Intake --  Output 2000 ml  Net -2000 ml     Wt Readings from Last 3 Encounters:  10/30/22 109.3 kg  07/25/22 125.2 kg  05/26/22 114.3 kg   Physical Exam General: Alert and oriented x 3, NAD Cardiovascular: S1 S2 clear, RRR.  Respiratory: CTAB, no wheezing Gastrointestinal: Soft, nontender, nondistended, NBS Ext: no pedal edema bilaterally Neuro: no new deficits Skin: Dressing intact on the left foot with wound VAC Psych: Normal affect    Data Reviewed:  I have personally reviewed following labs    CBC Lab Results  Component Value Date   WBC 9.1 10/30/2022   RBC 4.48 10/30/2022   HGB 12.7 10/30/2022   HCT 40.0 10/30/2022   MCV 89.3 10/30/2022   MCH 28.3 10/30/2022   PLT 146 (L) 10/30/2022   MCHC 31.8 10/30/2022   RDW 16.6 (H) 10/30/2022   LYMPHSABS 1.4 10/28/2022   MONOABS 0.6 10/28/2022   EOSABS 0.4 10/28/2022   BASOSABS 0.0 10/28/2022     Last metabolic panel Lab Results  Component Value Date   NA 134 (L) 10/30/2022   K 4.7 10/30/2022   CL 92 (L) 10/30/2022   CO2 26 10/30/2022   BUN 26 (H) 10/30/2022   CREATININE 5.75 (H) 10/30/2022   GLUCOSE 213 (H) 10/30/2022   GFRNONAA 8 (L) 10/30/2022   GFRAA 6 (L) 01/31/2020   CALCIUM 9.4 10/30/2022   PHOS 4.4 10/28/2022   PROT 6.3 (L) 10/21/2022   ALBUMIN 3.1 (L) 10/28/2022   LABGLOB 2.8 09/27/2020   AGRATIO 1.4 09/27/2020   BILITOT 0.6 10/21/2022   ALKPHOS 233 (H) 10/21/2022   AST 10 (L) 10/21/2022   ALT 15 10/21/2022   ANIONGAP 16 (H) 10/30/2022    CBG (last 3)  Recent Labs    10/29/22 2211 10/30/22 0754 10/30/22 1123  GLUCAP 257* 219* 189*      Coagulation Profile: No results for input(s): "INR", "PROTIME" in the last 168  hours.   Radiology Studies: I have personally reviewed the imaging studies  No results found.     Thad Ranger M.D. [facility] 10/30/2022, 1:23 PM  Available via Epic secure chat 7am-7pm After 7 pm, please refer to night coverage provider listed on amion.

## 2022-10-30 NOTE — Progress Notes (Signed)
[facility] Progress Note   Subjective:    Seen and examined patient at bedside. Tolerated yesterday's HD with net UF 2L. No acute complaints. Plan for HD 4/20.  Objective Vitals:   10/29/22 1847 10/29/22 2215 10/30/22 0500 10/30/22 0909  BP: (!) 132/57 (!) 131/49  138/66  Pulse: 93 94  88  Resp: Temp: 98.4 F (36.9 C) 98.2 F (36.8 C)  98.2 F (36.8 C)  TempSrc: Oral Oral  Oral  SpO2: 99% 99%  96%  Weight:   109.3 kg   Height:       Physical Exam General: Well-appear; seen sitting up in recliner; on RA; NAD Heart:Normal S1 and S2; No murmurs, gallops, or rubs Lungs: Clear throughout Abdomen: Soft and non-tender Extremities:LLE bandaged at great toe. No notable LE edema bilaterally  Dialysis Access: L AVF   Filed Weights   10/29/22 1305 10/29/22 1806 10/30/22 0500  Weight: 110.1 kg 108.9 kg 109.3 kg    Intake/Output Summary (Last 24 hours) at 10/30/2022 1302 Last data filed at 10/29/2022 1745 Gross per 24 hour  Intake --  Output 2000 ml  Net -2000 ml    Additional Objective Labs: Basic Metabolic Panel: Recent Labs  Lab 10/26/22 0056 10/27/22 0242 10/28/22 0253 10/29/22 0233 10/30/22 0255  NA 135 133* 133* 134* 134*  K 3.3* 3.7 3.9 4.5 4.7  CL 95* 97* 96* 99 92*  CO2 GLUCOSE 136* 139* 144* 159* 213*  BUN 32* 44* 30* 44* 26*  CREATININE 7.84* 8.91* 6.51* 8.00* 5.75*  CALCIUM 8.9 8.3* 9.2 9.1 9.4  PHOS 5.3* 5.4* 4.4  --   --    Liver Function Tests: Recent Labs  Lab 10/26/22 0056 10/27/22 0242 10/28/22 0253  ALBUMIN 3.1* 3.0* 3.1*   No results for input(s): "LIPASE", "AMYLASE" in the last 168 hours. CBC: Recent Labs  Lab 10/26/22 0056 10/27/22 0242 10/28/22 0253 10/29/22 0233 10/30/22 0255  WBC 6.0 5.4 6.3 7.3 9.1  NEUTROABS 3.4 2.8 3.8  --   --   HGB 12.9 12.4 13.3 12.2 12.7  HCT 40.7 41.2 41.7 41.3 40.0  MCV 88.5 90.4 88.3 92.6 89.3  PLT 166 169 153 155 146*   Blood Culture    Component  Value Date/Time   SDES BLOOD RIGHT ARM 10/20/2022 1538   SPECREQUEST  10/20/2022 1538    BOTTLES DRAWN AEROBIC AND ANAEROBIC Blood Culture adequate volume   CULT  10/20/2022 1538    NO GROWTH 5 DAYS Performed at [facility], [address] [place], [place] [address]    REPTSTATUS 10/25/2022 FINAL 10/20/2022 1538    Cardiac Enzymes: No results for input(s): "CKTOTAL", "CKMB", "CKMBINDEX", "TROPONINI" in the last 168 hours. CBG: Recent Labs  Lab 10/29/22 1106 10/29/22 1849 10/29/22 2211 10/30/22 0754 10/30/22 1123  GLUCAP 206* 147* 257* 219* 189*   Iron Studies: No results for input(s): "IRON", "TIBC", "TRANSFERRIN", "FERRITIN" in the last 72 hours. Lab Results  Component Value Date   INR 1.1 04/14/2022   INR 1.1 09/17/2020   INR 0.9 04/04/2019   Studies/Results: No results found.  Medications:  sodium chloride 500 mL (10/28/22 1010)    (feeding supplement) PROSource Plus  30 mL Oral BID BM   amLODipine  5 mg Oral Daily   atorvastatin  80 mg Oral QPM   Chlorhexidine Gluconate Cloth  6 each Topical Daily   Chlorhexidine Gluconate Cloth  6 each Topical Q0600  feeding supplement (NEPRO CARB STEADY)  237 mL Oral Q24H   fluticasone furoate-vilanterol  1 puff Inhalation Daily   heparin  5,000 Units Subcutaneous Q8H   insulin aspart  0-6 Units Subcutaneous TID WC   insulin detemir  20 Units Subcutaneous Daily   methocarbamol  500 mg Oral BID   pantoprazole  40 mg Oral Daily   pregabalin  100 mg Oral BID   sucroferric oxyhydroxide  500 mg Oral TID WC   vancomycin  125 mg Oral QID    Dialysis Orders: TTS at [facility] -> last HD 4/4, cut time - post-weight 113.5kg 4:15hr, 450/A1.5, EDW 111.5kg, 2K/2Ca, LUE AVF, heparin 3000 unit bolus - Mircera IV q 2 weeks - appears to have missed last dose, last given 3/21 - Venofer  IV weekly - Hectoral IV q HD  Assessment/Plan: AMS: ?Due to infection, doubt uremia. Head CT negative. Resolved, without altered  mentation on examination. L foot osteomyelitis: Non-healing wound s/p L great toe amputation 09/08/22 - purulent drainage, x-Kaigler suspicious for early osteo. Blood Cx collected, on Vanc + Ceftriaxone. Ortho has seen here, ABIs completed. Plan is for 1st [place] amputation on 4/17. C.Diff colitis: Improving on PO Vancomycin.  ESRD: Had missed 2 HD prior to admit. Now back on track, continue TTS schedule-on 3K bath with HD for now. K+ now 4.7. Recently received supplementation. Next HD 4/20. 5.  Hypertension/volume: BP improving, below her prior EDW. Will lower at discharge. 6.  Anemia: Hgb > 12, no ESA needed for now. 7.  Metabolic bone disease: Ca ok, Phos controlled. Holding VDRA for now. Restart home binder (Velphoro). 8.  Nutrition: Alb low, continue supplements for wound healing.  Salome Holmes, NP [facility] 10/30/2022,1:02 PM  LOS: 10 days

## 2022-10-30 NOTE — Evaluation (Signed)
Occupational Therapy Evaluation Patient Details Name: [name] MRN: [id number] DOB: [birth year] Today's Date: 10/30/2022   History of Present Illness Pt is a 67 y/o female who presents 10/20/22 from home after missed HD and fall at home. Pt with AMS and noted to have dehiscence great toe amputation on the L foot (~1 month ago performed in [place]). Pt now s/p L foot 1st Schwenn amputation on 10/28/2022 per Dr. Lajoyce Corners. PMH significant for aortic stenosis, CAD, CHF, CKD on HD, DM, Hep C, HTN, MI.   Clinical Impression   Pt was ambulating with a cane vs walker prior to admission. She was independent in ADLs, IADLs and driving. Pt presents with 5/10 pain in L foot, generalized weakness and impaired standing balance. Grooming in sitting requires set up, UB bathing with min assist to set up and total assist for LB ADLs. Pt was anxious with OOB activity, but was able to transfer with min assist to stand from elevated surfaces and min guard assist with RW, DARCO shoe and cues for technique. Began educating pt in compensatory strategies for LB ADLs sitting/leaning side to side as she struggles to maintain TDWB on L foot. Pt is motivated to return home.      Recommendations for follow up therapy are one component of a multi-disciplinary discharge planning process, led by the attending physician.  Recommendations may be updated based on patient status, additional functional criteria and insurance authorization.   Assistance Recommended at Discharge Frequent or constant Supervision/Assistance  Patient can return home with the following A little help with walking and/or transfers;A lot of help with bathing/dressing/bathroom;Assistance with cooking/housework;Assist for transportation;Help with stairs or ramp for entrance    Functional Status Assessment  Patient has had a recent decline in their functional status and demonstrates the ability to make significant improvements in function in a reasonable and  predictable amount of time.  Equipment Recommendations  BSC/3in1;Tub/shower bench;Wheelchair (measurements OT);Wheelchair cushion (measurements OT)    Recommendations for Other Services       Precautions / Restrictions Precautions Precautions: Fall Precaution Comments: Wound VAC--prevena Required Braces or Orthoses: Other Brace Other Brace: Has DARCO shoe with forefoot cut out Restrictions Weight Bearing Restrictions: Yes LLE Weight Bearing: Touchdown weight bearing      Mobility Bed Mobility Overal bed mobility: Needs Assistance Bed Mobility: Supine to Sit     Supine to sit: Supervision     General bed mobility comments: HOB up    Transfers Overall transfer level: Needs assistance Equipment used: Rolling walker (2 wheels) Transfers: Sit to/from Stand, Bed to chair/wheelchair/BSC Sit to Stand: Min assist, From elevated surface     Step pivot transfers: Min guard     General transfer comment: DARCO shoe on L and sneaker on R, cues for technique and to take weight through UEs to maintain TDWB on L LE, assist to rise and steady      Balance Overall balance assessment: Needs assistance   Sitting balance-Leahy Scale: Good       Standing balance-Leahy Scale: Poor                             ADL either performed or assessed with clinical judgement   ADL Overall ADL's : Needs assistance/impaired Eating/Feeding: Independent;Sitting   Grooming: Set up;Sitting   Upper Body Bathing: Minimal assistance;Sitting   Lower Body Bathing: Total assistance;Sit to/from stand   Upper Body Dressing : Set up;Sitting   Lower Body Dressing: Total  assistance;Sitting/lateral leans   Toilet Transfer: Min guard;Stand-pivot;BSC/3in1;Rolling walker (2 wheels)                   Vision Baseline Vision/History: 1 Wears glasses Ability to See in Adequate Light: 0 Adequate Patient Visual Report: No change from baseline       Perception     Praxis       Pertinent Vitals/Pain Pain Assessment Pain Assessment: 0-10 Pain Score: 5  Pain Location: L foot Pain Descriptors / Indicators: Operative site guarding, Discomfort Pain Intervention(s): Premedicated before session, Repositioned, Monitored during session     Hand Dominance Right   Extremity/Trunk Assessment Upper Extremity Assessment Upper Extremity Assessment: Overall WFL for tasks assessed   Lower Extremity Assessment Lower Extremity Assessment: Defer to PT evaluation       Communication Communication Communication: No difficulties   Cognition Arousal/Alertness: Awake/alert Behavior During Therapy: WFL for tasks assessed/performed Overall Cognitive Status: Within Functional Limits for tasks assessed                                       General Comments       Exercises     Shoulder Instructions      Home Living Family/patient expects to be discharged to:: Private residence Living Arrangements: Alone Available Help at Discharge: Family;Available 24 hours/day (18 year old grandson) Type of Home: Apartment Home Access: Level entry     Home Layout: One level     Bathroom Shower/Tub: Chief Strategy Officer: Standard     Home Equipment: Agricultural consultant (2 wheels);Rollator (4 wheels);Cane - single point          Prior Functioning/Environment Prior Level of Function : Needs assist             Mobility Comments: Cane vs RW vs Rollator ADLs Comments: Pt. reports independence with ADLs/IADLs, driving        OT Problem List: Decreased strength;Impaired balance (sitting and/or standing);Decreased safety awareness;Decreased knowledge of use of DME or AE;Pain      OT Treatment/Interventions: Self-care/ADL training;DME and/or AE instruction;Therapeutic activities;Patient/family education;Balance training    OT Goals(Current goals can be found in the care plan section) Acute Rehab OT Goals OT Goal Formulation: With  patient Time For Goal Achievement: 11/13/22 Potential to Achieve Goals: Good ADL Goals Pt Will Perform Lower Body Bathing: with modified independence;sit to/from stand;with adaptive equipment Pt Will Perform Lower Body Dressing: with modified independence;sitting/lateral leans;with adaptive equipment Pt Will Transfer to Toilet: ambulating;bedside commode;with supervision Pt Will Perform Toileting - Clothing Manipulation and hygiene: with modified independence;sitting/lateral leans  OT Frequency: Min 2X/week    Co-evaluation              AM-PAC OT "6 Clicks" Daily Activity     Outcome Measure Help from another person eating meals?: None Help from another person taking care of personal grooming?: A Little Help from another person toileting, which includes using toliet, bedpan, or urinal?: A Lot Help from another person bathing (including washing, rinsing, drying)?: A Lot Help from another person to put on and taking off regular upper body clothing?: A Little Help from another person to put on and taking off regular lower body clothing?: A Lot 6 Click Score: 16   End of Session Equipment Utilized During Treatment: Gait belt;Rolling walker (2 wheels) Nurse Communication: Mobility status  Activity Tolerance: Patient tolerated treatment well Patient left: in  chair;with call bell/phone within reach;with chair alarm set  OT Visit Diagnosis: Unsteadiness on feet (R26.81);Pain;Muscle weakness (generalized) (M62.81) Pain - Right/Left: Left Pain - part of body: Ankle and joints of foot                Time: 1610-9604 OT Time Calculation (min): 43 min Charges:  OT General Charges $OT Visit: 1 Visit OT Evaluation $OT Eval Moderate Complexity: 1 Mod OT Treatments $Self Care/Home Management : 23-37 mins  Berna Spare, OTR/L Acute Rehabilitation Services Office: [phone]   Evern Bio 10/30/2022, 12:04 PM

## 2022-10-31 DIAGNOSIS — M86172 Other acute osteomyelitis, left ankle and foot: Secondary | ICD-10-CM | POA: Diagnosis not present

## 2022-10-31 DIAGNOSIS — G9340 Encephalopathy, unspecified: Secondary | ICD-10-CM | POA: Diagnosis not present

## 2022-10-31 DIAGNOSIS — K5100 Ulcerative (chronic) pancolitis without complications: Secondary | ICD-10-CM | POA: Diagnosis not present

## 2022-10-31 LAB — GLUCOSE, CAPILLARY
Glucose-Capillary: 190 mg/dL — ABNORMAL HIGH (ref 70–99)
Glucose-Capillary: 184 mg/dL — ABNORMAL HIGH (ref 70–99)
Glucose-Capillary: 166 mg/dL — ABNORMAL HIGH (ref 70–99)
Glucose-Capillary: 221 mg/dL — ABNORMAL HIGH (ref 70–99)

## 2022-10-31 MED ORDER — GERHARDT'S BUTT CREAM
TOPICAL_CREAM | Freq: Two times a day (BID) | CUTANEOUS | Status: DC
  Filled 2022-10-31: qty 1

## 2022-10-31 NOTE — Progress Notes (Addendum)
[facility] Progress Note   Subjective:    Seen and examined patient at bedside. Feels fine with no acute issues. VS and labs remain stable. Due to high census on the HD unit, will need to schedule her hemodialysis to tomorrow 4/21. I discussed this with Dr. Isidoro Donning and Dr. Arlean Hopping. Patient made aware as well. She may be discharged tomorrow so will schedule her hemodialysis 1st case tomorrow morning.  Objective Vitals:   10/30/22 2307 10/31/22 0426 10/31/22 0529 10/31/22 0824  BP: (!) 137/50  (!) 128/95 136/83  Pulse: 93  92 92  Resp: Temp: 98.3 F (36.8 C)  98.4 F (36.9 C) 98.2 F (36.8 C)  TempSrc: Oral  Oral Oral  SpO2: 99%  97% 100%  Weight: 108.8 kg 108.8 kg    Height:       Physical Exam General: Well-appear; seen sitting up in recliner; on RA; NAD Heart:Normal S1 and S2; No murmurs, gallops, or rubs Lungs: Clear throughout Abdomen: Soft and non-tender Extremities:LLE bandaged at great toe. No notable LE edema bilaterally  Dialysis Access: L AVF   Filed Weights   10/30/22 0500 10/30/22 2307 10/31/22 0426  Weight: 109.3 kg 108.8 kg 108.8 kg    Intake/Output Summary (Last 24 hours) at 10/31/2022 1356 Last data filed at 10/31/2022 0900 Gross per 24 hour  Intake 720 ml  Output --  Net 720 ml    Additional Objective Labs: Basic Metabolic Panel: Recent Labs  Lab 10/26/22 0056 10/27/22 0242 10/28/22 0253 10/29/22 0233 10/30/22 0255  NA 135 133* 133* 134* 134*  K 3.3* 3.7 3.9 4.5 4.7  CL 95* 97* 96* 99 92*  CO2 GLUCOSE 136* 139* 144* 159* 213*  BUN 32* 44* 30* 44* 26*  CREATININE 7.84* 8.91* 6.51* 8.00* 5.75*  CALCIUM 8.9 8.3* 9.2 9.1 9.4  PHOS 5.3* 5.4* 4.4  --   --    Liver Function Tests: Recent Labs  Lab 10/26/22 0056 10/27/22 0242 10/28/22 0253  ALBUMIN 3.1* 3.0* 3.1*   No results for input(s): "LIPASE", "AMYLASE" in the last 168 hours. CBC: Recent Labs  Lab 10/26/22 0056 10/27/22 0242 10/28/22 0253  10/29/22 0233 10/30/22 0255  WBC 6.0 5.4 6.3 7.3 9.1  NEUTROABS 3.4 2.8 3.8  --   --   HGB 12.9 12.4 13.3 12.2 12.7  HCT 40.7 41.2 41.7 41.3 40.0  MCV 88.5 90.4 88.3 92.6 89.3  PLT 166 169 153 155 146*   Blood Culture    Component Value Date/Time   SDES BLOOD RIGHT ARM 10/20/2022 1538   SPECREQUEST  10/20/2022 1538    BOTTLES DRAWN AEROBIC AND ANAEROBIC Blood Culture adequate volume   CULT  10/20/2022 1538    NO GROWTH 5 DAYS Performed at [facility], [address] [place], [place] [address]    REPTSTATUS 10/25/2022 FINAL 10/20/2022 1538    Cardiac Enzymes: No results for input(s): "CKTOTAL", "CKMB", "CKMBINDEX", "TROPONINI" in the last 168 hours. CBG: Recent Labs  Lab 10/30/22 1123 10/30/22 1655 10/30/22 2310 10/31/22 0714 10/31/22 1108  GLUCAP 189* 228* 215* 190* 184*   Iron Studies: No results for input(s): "IRON", "TIBC", "TRANSFERRIN", "FERRITIN" in the last 72 hours. Lab Results  Component Value Date   INR 1.1 04/14/2022   INR 1.1 09/17/2020   INR 0.9 04/04/2019   Studies/Results: No results found.  Medications:  sodium chloride 500 mL (10/28/22 1010)    (feeding supplement) PROSource Plus  30 mL Oral BID BM   amLODipine  5 mg Oral Daily   aspirin EC  81 mg Oral Daily   atorvastatin  80 mg Oral QPM   Chlorhexidine Gluconate Cloth  6 each Topical Daily   Chlorhexidine Gluconate Cloth  6 each Topical Q0600   feeding supplement (NEPRO CARB STEADY)  237 mL Oral Q24H   fluticasone furoate-vilanterol  1 puff Inhalation Daily   Gerhardt's butt cream   Topical BID   heparin  5,000 Units Subcutaneous Q8H   insulin aspart  0-15 Units Subcutaneous TID WC   insulin detemir  20 Units Subcutaneous Daily   methocarbamol  500 mg Oral BID   pantoprazole  40 mg Oral Daily   pregabalin  100 mg Oral BID   sucroferric oxyhydroxide  500 mg Oral TID WC   vancomycin  125 mg Oral QID    Dialysis Orders: TTS at [facility] -> last HD 4/4, cut time - post-weight  113.5kg 4:15hr, 450/A1.5, EDW 111.5kg, 2K/2Ca, LUE AVF, heparin 3000 unit bolus - Mircera IV q 2 weeks - appears to have missed last dose, last given 3/21 - Venofer  IV weekly - Hectoral IV q HD  Assessment/Plan: AMS: ?Due to infection, doubt uremia. Head CT negative. Resolved, without altered mentation on examination. L foot osteomyelitis: Non-healing wound s/p L great toe amputation 09/08/22 - purulent drainage, x-Henshaw suspicious for early osteo. Completed 10 day course of IV vanc/ zosyn. Ortho has seen here, ABIs completed. Pt is s/p 1st Amos amputation on 4/17. C.Diff colitis: Improving on PO Vancomycin.  ESRD: Had missed 2 HD prior to admit. Now back on track, continue TTS schedule-on 3K bath with HD for now. K+ now 4.7. Recently received supplementation. Due to high census on the HD unit today, next HD rescheduled to 4/21. Made Dr. Isidoro Donning, Dr. Arlean Hopping, and patient aware of this. 5.  Hypertension/volume: BP stable, below her prior EDW. Will lower at discharge. 6.  Anemia: Hgb > 12, no ESA needed for now. 7.  Metabolic bone disease: Ca ok, Phos controlled. Holding VDRA for now. Restart home binder (Velphoro). 8.  Nutrition: Alb low, continue supplements for wound healing. 9.  Dispo:  Patient may be discharged tomorrow. Plan to scheduled her HD 1st case tomorrow.   Salome Holmes, NP [facility] 10/31/2022,1:56 PM  LOS: 11 days    Pt seen, examined and agree w A/P as above.  Vinson Moselle  MD 10/31/2022, 4:56 PM

## 2022-10-31 NOTE — Progress Notes (Signed)
[facility]                                                                              [name], is a 67 y.o. female, DOB - July 29, 1955, GNF:[id number] Admit date - 10/20/2022    Outpatient Primary MD for the patient is Levan Hurst, Anastasia Fiedler, FNP  LOS - 11  days  Chief Complaint  Patient presents with   Altered Mental Status       Brief summary   Patient is a 67 year old female with IDDM, ESRD on HD TTS, HLP, CAD, HTN, GERD, hep C, depression presented on 10/20/2022 with altered mental status.  Per family, patient had told them a day before the admission but she did not feel well.  She fell on the morning of admission and was not down for about 3 hours.  In ED, she was noted to have left foot wound.   Left foot x-Breit showed early possible osteomyelitis of first metatarsal head, pancolitis on the CT abdomen.   Assessment & Plan    Principal Problem:   Acute metabolic encephalopathy -CT head, CT C-spine negative for any abnormality -Resolved, alert and oriented, appears at her baseline  Active Problems: Osteomyelitis of the left foot -MRI of the foot showed medial forefoot soft tissue wound with underlying early osteomyelitis of the residually first metatarsal head and medial sesamoid, small abscess 1.0x 0.6x 0.5 cm -Orthopedics consulted.  ABIs within normal range - s/p left foot first [place] amputation by Dr. Lajoyce Corners on 4/17, postop day # 3 -Completed IV vancomycin and Rocephin -Plan for wound VAC for 1 week, touchdown weightbearing on the left, follow-up in office in 1 week   C. difficile colitis, pancolitis -Patient was on ciprofloxacin and Flagyl until 3/15 after great toe amputation -Oral vancomycin started on 4/11, per patient diarrhea has improved -Will DC for 14 days of oral vancomycin at discharge, starting from 4/19.  IV antibiotics completed on 4/18.    COPD (chronic obstructive pulmonary disease) -Currently stable, no acute wheezing     Essential  hypertension -BP stable  ESRD on hemodialysis TTS -Nephrology following    Type 2 diabetes mellitus with chronic kidney disease, with long-term current use of insulin -Hemoglobin A1c 8.8 on 07/23/2022 CBG (last 3)  Recent Labs    10/30/22 2310 10/31/22 0714 10/31/22 1108  GLUCAP 215* 190* 184*  -Continue Levemir 20 units daily, increase SSI to moderate  Hypertension -Outpatient on Coreg and midodrine, and patient placed on amlodipine  CAD, hyperlipidemia - resume statin     Chronic diastolic heart failure -No acute issues, monitor management with HD    PVD (peripheral vascular disease) -Aspirin and statin resumed    Prolonged QT interval -Avoid QT prolonging meds  Obesity Estimated body mass index is 39.91 kg/m as calculated from the following:   Height as of this encounter:  (1.651 m).   Weight as of this encounter: 108.8 kg.  Code Status: Full code DVT Prophylaxis:  heparin injection 5,000 Units Start: 10/21/22 1400   Level of Care: Level of care: Telemetry Medical Family Communication: Updated patient' Disposition Plan:      Remains  inpatient appropriate: Plan HD today.  Patient reports that she has to "get her house situated and ask her grandson to come stay with her".  Explained to the patient that outpatient PT will start tomorrow per [facility] note yesterday. Once HD is done, she will be able to discharge home tomorrow.   Procedures:  10/28/2022 left foot first Clemons amputation  Consultants:   Orthopedics  Antimicrobials:   Anti-infectives (From admission, onward)    Start     Dose/Rate Route Frequency Ordered Stop   10/28/22 0947  ceFAZolin (ANCEF) 2-4 GM/100ML-% IVPB       Note to Pharmacy: [facility], [name]: cabinet override      10/28/22 0947 10/28/22 1049   10/28/22 0630  ceFAZolin (ANCEF) IVPB 2g/100 mL premix        2 g 200 mL/hr over 30 Minutes Intravenous On call to O.R. 10/28/22 0539 10/28/22 1045   10/28/22 0630  vancomycin (VANCOREADY) IVPB  1500 mg/300 mL        1,500 mg 150 mL/hr over 120 Minutes Intravenous On call to O.R. 10/28/22 0539 10/28/22 1230   10/24/22 1200  cefTRIAXone (ROCEPHIN) 2 g in sodium chloride 0.9 % 100 mL IVPB        2 g 200 mL/hr over 30 Minutes Intravenous Every 24 hours 10/24/22 0952 10/29/22 2119   10/22/22 1800  vancomycin (VANCOCIN) capsule 125 mg        125 mg Oral 4 times daily 10/22/22 1654 11/12/22 1759   10/21/22 1530  vancomycin (VANCOCIN) IVPB 1000 mg/200 mL premix        1,000 mg 200 mL/hr over 60 Minutes Intravenous  Once 10/21/22 1439 10/21/22 1644   10/20/22 2000  vancomycin (VANCOCIN) IVPB 1000 mg/200 mL premix        1,000 mg 200 mL/hr over 60 Minutes Intravenous Every T-Th-Sa (Hemodialysis) 10/20/22 1841 10/29/22 2232   10/20/22 1900  piperacillin-tazobactam (ZOSYN) IVPB 2.25 g  Status:  Discontinued        2.25 g 100 mL/hr over 30 Minutes Intravenous Every 8 hours 10/20/22 1837 10/24/22 0952   10/20/22 1845  vancomycin (VANCOREADY) IVPB 2000 mg/400 mL        2,000 mg 200 mL/hr over 120 Minutes Intravenous  Once 10/20/22 1837 10/20/22 2250          Medications  (feeding supplement) PROSource Plus  30 mL Oral BID BM   amLODipine  5 mg Oral Daily   aspirin EC  81 mg Oral Daily   atorvastatin  80 mg Oral QPM   Chlorhexidine Gluconate Cloth  6 each Topical Daily   Chlorhexidine Gluconate Cloth  6 each Topical Q0600   feeding supplement (NEPRO CARB STEADY)  237 mL Oral Q24H   fluticasone furoate-vilanterol  1 puff Inhalation Daily   Gerhardt's butt cream   Topical BID   heparin  5,000 Units Subcutaneous Q8H   insulin aspart  0-15 Units Subcutaneous TID WC   insulin detemir  20 Units Subcutaneous Daily   methocarbamol  500 mg Oral BID   pantoprazole  40 mg Oral Daily   pregabalin  100 mg Oral BID   sucroferric oxyhydroxide  500 mg Oral TID WC   vancomycin  125 mg Oral QID      Subjective:   [name] was seen and examined today.  Pain is controlled.  Still does not  feel ready to go home today, states she will ask her grandson to come stay with her.  HD planned today.  Objective:   Vitals:   10/30/22 2307 10/31/22 0426 10/31/22 0529 10/31/22 0824  BP: (!) 137/50  (!) 128/95 136/83  Pulse: 93  92 92  Resp: Temp: 98.3 F (36.8 C)  98.4 F (36.9 C) 98.2 F (36.8 C)  TempSrc: Oral  Oral Oral  SpO2: 99%  97% 100%  Weight: 108.8 kg 108.8 kg    Height:        Intake/Output Summary (Last 24 hours) at 10/31/2022 1228 Last data filed at 10/31/2022 0900 Gross per 24 hour  Intake 720 ml  Output --  Net 720 ml     Wt Readings from Last 3 Encounters:  10/31/22 108.8 kg  07/25/22 125.2 kg  05/26/22 114.3 kg   Physical Exam General: Alert and oriented x 3, NAD Cardiovascular: S1 S2 clear, RRR.  Respiratory: CTAB Gastrointestinal: Soft, nontender, nondistended, NBS Ext: no pedal edema bilaterally Neuro: no new deficits Skin: Dressing intact on the left foot, wound VAC+ Psych: Normal affect     Data Reviewed:  I have personally reviewed following labs    CBC Lab Results  Component Value Date   WBC 9.1 10/30/2022   RBC 4.48 10/30/2022   HGB 12.7 10/30/2022   HCT 40.0 10/30/2022   MCV 89.3 10/30/2022   MCH 28.3 10/30/2022   PLT 146 (L) 10/30/2022   MCHC 31.8 10/30/2022   RDW 16.6 (H) 10/30/2022   LYMPHSABS 1.4 10/28/2022   MONOABS 0.6 10/28/2022   EOSABS 0.4 10/28/2022   BASOSABS 0.0 10/28/2022     Last metabolic panel Lab Results  Component Value Date   NA 134 (L) 10/30/2022   K 4.7 10/30/2022   CL 92 (L) 10/30/2022   CO2 26 10/30/2022   BUN 26 (H) 10/30/2022   CREATININE 5.75 (H) 10/30/2022   GLUCOSE 213 (H) 10/30/2022   GFRNONAA 8 (L) 10/30/2022   GFRAA 6 (L) 01/31/2020   CALCIUM 9.4 10/30/2022   PHOS 4.4 10/28/2022   PROT 6.3 (L) 10/21/2022   ALBUMIN 3.1 (L) 10/28/2022   LABGLOB 2.8 09/27/2020   AGRATIO 1.4 09/27/2020   BILITOT 0.6 10/21/2022   ALKPHOS 233 (H) 10/21/2022   AST 10 (L) 10/21/2022    ALT 15 10/21/2022   ANIONGAP 16 (H) 10/30/2022    CBG (last 3)  Recent Labs    10/30/22 2310 10/31/22 0714 10/31/22 1108  GLUCAP 215* 190* 184*      Coagulation Profile: No results for input(s): "INR", "PROTIME" in the last 168 hours.   Radiology Studies: I have personally reviewed the imaging studies  No results found.     Thad Ranger M.D. [facility] 10/31/2022, 12:28 PM  Available via Epic secure chat 7am-7pm After 7 pm, please refer to night coverage provider listed on amion.

## 2022-11-01 DIAGNOSIS — M86172 Other acute osteomyelitis, left ankle and foot: Secondary | ICD-10-CM | POA: Diagnosis not present

## 2022-11-01 DIAGNOSIS — G9340 Encephalopathy, unspecified: Secondary | ICD-10-CM | POA: Diagnosis not present

## 2022-11-01 DIAGNOSIS — K5100 Ulcerative (chronic) pancolitis without complications: Secondary | ICD-10-CM | POA: Diagnosis not present

## 2022-11-01 LAB — RENAL FUNCTION PANEL
Albumin: 3 g/dL — ABNORMAL LOW (ref 3.5–5.0)
Anion gap: 12 (ref 5–15)
BUN: 23 mg/dL (ref 8–23)
CO2: 25 mmol/L (ref 22–32)
Calcium: 8.9 mg/dL (ref 8.9–10.3)
Chloride: 95 mmol/L — ABNORMAL LOW (ref 98–111)
Creatinine, Ser: 4.74 mg/dL — ABNORMAL HIGH (ref 0.44–1.00)
GFR, Estimated: 10 mL/min — ABNORMAL LOW (ref >60)
Glucose, Bld: 156 mg/dL — ABNORMAL HIGH (ref 70–99)
Phosphorus: 3 mg/dL (ref 2.5–4.6)
Potassium: 4 mmol/L (ref 3.5–5.1)
Sodium: 132 mmol/L — ABNORMAL LOW (ref 135–145)

## 2022-11-01 LAB — CBC WITH DIFFERENTIAL/PLATELET
Abs Immature Granulocytes: 0.03 10*3/uL (ref 0.00–0.07)
Basophils Absolute: 0 10*3/uL (ref 0.0–0.1)
Basophils Relative: 0 %
Eosinophils Absolute: 0.2 10*3/uL (ref 0.0–0.5)
Eosinophils Relative: 3 %
HCT: 40.7 % (ref 36.0–46.0)
Hemoglobin: 12.6 g/dL (ref 12.0–15.0)
Immature Granulocytes: 0 %
Lymphocytes Relative: 16 %
Lymphs Abs: 1.1 10*3/uL (ref 0.7–4.0)
MCH: 28 pg (ref 26.0–34.0)
MCHC: 31 g/dL (ref 30.0–36.0)
MCV: 90.4 fL (ref 80.0–100.0)
Monocytes Absolute: 0.6 10*3/uL (ref 0.1–1.0)
Monocytes Relative: 8 %
Neutro Abs: 5 10*3/uL (ref 1.7–7.7)
Neutrophils Relative %: 73 %
Platelets: 114 10*3/uL — ABNORMAL LOW (ref 150–400)
RBC: 4.5 MIL/uL (ref 3.87–5.11)
RDW: 16.3 % — ABNORMAL HIGH (ref 11.5–15.5)
WBC: 6.9 10*3/uL (ref 4.0–10.5)
nRBC: 0 % (ref 0.0–0.2)

## 2022-11-01 LAB — GLUCOSE, CAPILLARY
Glucose-Capillary: 142 mg/dL — ABNORMAL HIGH (ref 70–99)
Glucose-Capillary: 230 mg/dL — ABNORMAL HIGH (ref 70–99)

## 2022-11-01 MED ORDER — VANCOMYCIN HCL 125 MG PO CAPS: 125.0000 mg | ORAL_CAPSULE | Freq: Four times a day (QID) | ORAL | 0 refills | Status: DC

## 2022-11-01 MED ORDER — METHOCARBAMOL 500 MG PO TABS: 500.0000 mg | ORAL_TABLET | Freq: Two times a day (BID) | ORAL | 0 refills | Status: DC

## 2022-11-01 MED ORDER — LEVEMIR FLEXPEN 100 UNIT/ML ~~LOC~~ SOPN: 25.0000 [IU] | PEN_INJECTOR | Freq: Every day | SUBCUTANEOUS | 11 refills | Status: DC

## 2022-11-01 MED ORDER — PREGABALIN 100 MG PO CAPS: 100.0000 mg | ORAL_CAPSULE | Freq: Two times a day (BID) | ORAL | 0 refills | Status: DC

## 2022-11-01 NOTE — Plan of Care (Signed)
  Problem: Education: Goal: Ability to describe self-care measures that may prevent or decrease complications (Diabetes Survival Skills Education) will improve Outcome: Adequate for Discharge Goal: Individualized Educational Video(s) Outcome: Adequate for Discharge   Problem: Coping: Goal: Ability to adjust to condition or change in health will improve Outcome: Adequate for Discharge   Problem: Fluid Volume: Goal: Ability to maintain a balanced intake and output will improve Outcome: Adequate for Discharge   Problem: Health Behavior/Discharge Planning: Goal: Ability to identify and utilize available resources and services will improve Outcome: Adequate for Discharge Goal: Ability to manage health-related needs will improve Outcome: Adequate for Discharge   Problem: Metabolic: Goal: Ability to maintain appropriate glucose levels will improve Outcome: Adequate for Discharge   Problem: Nutritional: Goal: Maintenance of adequate nutrition will improve Outcome: Adequate for Discharge Goal: Progress toward achieving an optimal weight will improve Outcome: Adequate for Discharge   Problem: Skin Integrity: Goal: Risk for impaired skin integrity will decrease Outcome: Adequate for Discharge   Problem: Tissue Perfusion: Goal: Adequacy of tissue perfusion will improve Outcome: Adequate for Discharge   Problem: Education: Goal: Knowledge of General Education information will improve Description: Including pain rating scale, medication(s)/side effects and non-pharmacologic comfort measures Outcome: Adequate for Discharge   Problem: Health Behavior/Discharge Planning: Goal: Ability to manage health-related needs will improve Outcome: Adequate for Discharge   Problem: Clinical Measurements: Goal: Ability to maintain clinical measurements within normal limits will improve Outcome: Adequate for Discharge Goal: Will remain free from infection Outcome: Adequate for Discharge Goal:  Diagnostic test results will improve Outcome: Adequate for Discharge Goal: Respiratory complications will improve Outcome: Adequate for Discharge Goal: Cardiovascular complication will be avoided Outcome: Adequate for Discharge   Problem: Activity: Goal: Risk for activity intolerance will decrease Outcome: Adequate for Discharge   Problem: Nutrition: Goal: Adequate nutrition will be maintained Outcome: Adequate for Discharge   Problem: Coping: Goal: Level of anxiety will decrease Outcome: Adequate for Discharge   Problem: Elimination: Goal: Will not experience complications related to bowel motility Outcome: Adequate for Discharge Goal: Will not experience complications related to urinary retention Outcome: Adequate for Discharge   Problem: Pain Managment: Goal: General experience of comfort will improve Outcome: Adequate for Discharge   Problem: Safety: Goal: Ability to remain free from injury will improve Outcome: Adequate for Discharge   Problem: Skin Integrity: Goal: Risk for impaired skin integrity will decrease Outcome: Adequate for Discharge   Problem: Education: Goal: Knowledge of the prescribed therapeutic regimen will improve Outcome: Adequate for Discharge Goal: Ability to verbalize activity precautions or restrictions will improve Outcome: Adequate for Discharge Goal: Understanding of discharge needs will improve Outcome: Adequate for Discharge   Problem: Activity: Goal: Ability to perform//tolerate increased activity and mobilize with assistive devices will improve Outcome: Adequate for Discharge   Problem: Clinical Measurements: Goal: Postoperative complications will be avoided or minimized Outcome: Adequate for Discharge   Problem: Self-Care: Goal: Ability to meet self-care needs will improve Outcome: Adequate for Discharge   Problem: Self-Concept: Goal: Ability to maintain and perform role responsibilities to the fullest extent possible will  improve Outcome: Adequate for Discharge   Problem: Pain Management: Goal: Pain level will decrease with appropriate interventions Outcome: Adequate for Discharge   Problem: Skin Integrity: Goal: Demonstration of wound healing without infection will improve Outcome: Adequate for Discharge   

## 2022-11-01 NOTE — Progress Notes (Signed)
Received patient in bed to unit.  Alert and oriented.  Informed consent signed and in chart.   TX duration:4  Patient tolerated well.  Transported back to the room  Alert, without acute distress.  Hand-off given to patient's nurse.   Access used: fistula Access issues: none  Total UF removed: 2000 ml Medication(s) given: none Post HD VS: 138/62 Post HD weight: 109.5 kg    11/01/22 0544  Vitals  Temp 98.4 F (36.9 C)  Temp Source Oral  BP (!) 157/63  MAP (mmHg) 88  BP Location Right Arm  BP Method Automatic  Patient Position (if appropriate) Lying  Pulse Rate 85  Pulse Rate Source Monitor  ECG Heart Rate 85  Resp 14  Oxygen Therapy  SpO2 97 %  O2 Device Room Air  Patient Activity (if Appropriate) In bed  Pulse Oximetry Type Continuous  Post Treatment  Dialyzer Clearance Lightly streaked  Duration of HD Treatment -hour(s) 4 hour(s)  Hemodialysis Intake (mL) 0 mL  Liters Processed 96  Fluid Removed (mL) 2000 mL  Tolerated HD Treatment Yes  Post-Hemodialysis Comments HD tx achieved as expected without complications, tolerated well, no complaints.  AVG/AVF Arterial Site Held (minutes) 10 minutes  AVG/AVF Venous Site Held (minutes) 10 minutes  Note  Observations pt is in bed resting, alert, oriented, verbally responsive, denies pain pt is stable.  Fistula / Graft Left Upper arm Arteriovenous fistula  Placement Date/Time: 07/24/22 0745   Placed prior to admission: Yes  Orientation: Left  Access Location: Upper arm  Access Type: Arteriovenous fistula  Site Condition No complications  Fistula / Graft Assessment Present;Thrill;Bruit  Status Deaccessed  Drainage Description None

## 2022-11-01 NOTE — Progress Notes (Signed)
[facility] Progress Note   Subjective:    Seen and examined patient at bedside. Seen sitting in her recliner with no issues. Thankfully, she received HD overnight and tolerated net UF 2L. Okay for discharge from renal standpoint. I made the Hospitalist aware.  Objective Vitals:   11/01/22 0514 11/01/22 0544 11/01/22 0608 11/01/22 0903  BP: 138/62 (!) 157/63  (!) 145/58  Pulse: 79 85  92  Resp: Temp:  98.4 F (36.9 C)  98.1 F (36.7 C)  TempSrc:  Oral  Oral  SpO2: 97% 97%  90%  Weight:   109.5 kg   Height:       Physical Exam General: Well-appear; seen sitting up in recliner; on RA; NAD Heart:Normal S1 and S2; No murmurs, gallops, or rubs Lungs: Clear throughout Abdomen: Soft and non-tender Extremities:LLE bandaged at great toe. No notable LE edema bilaterally  Dialysis Access: L AVF   Filed Weights   10/30/22 2307 10/31/22 0426 11/01/22 1610  Weight: 108.8 kg 108.8 kg 109.5 kg    Intake/Output Summary (Last 24 hours) at 11/01/2022 1118 Last data filed at 11/01/2022 0900 Gross per 24 hour  Intake 880 ml  Output 2000 ml  Net -1120 ml    Additional Objective Labs: Basic Metabolic Panel: Recent Labs  Lab 10/27/22 0242 10/28/22 0253 10/29/22 0233 10/30/22 0255 11/01/22 0722  NA 133* 133* 134* 134* 132*  K 3.7 3.9 4.5 4.7 4.0  CL 97* 96* 99 92* 95*  CO2 GLUCOSE 139* 144* 159* 213* 156*  BUN 44* 30* 44* 26* 23  CREATININE 8.91* 6.51* 8.00* 5.75* 4.74*  CALCIUM 8.3* 9.2 9.1 9.4 8.9  PHOS 5.4* 4.4  --   --  3.0   Liver Function Tests: Recent Labs  Lab 10/27/22 0242 10/28/22 0253 11/01/22 0722  ALBUMIN 3.0* 3.1* 3.0*   No results for input(s): "LIPASE", "AMYLASE" in the last 168 hours. CBC: Recent Labs  Lab 10/27/22 0242 10/28/22 0253 10/29/22 0233 10/30/22 0255 11/01/22 0722  WBC 5.4 6.3 7.3 9.1 6.9  NEUTROABS 2.8 3.8  --   --  5.0  HGB 12.4 13.3 12.2 12.7 12.6  HCT 41.2 41.7 41.3 40.0 40.7  MCV 90.4  88.3 92.6 89.3 90.4  PLT 169 153 155 146* 114*   Blood Culture    Component Value Date/Time   SDES BLOOD RIGHT ARM 10/20/2022 1538   SPECREQUEST  10/20/2022 1538    BOTTLES DRAWN AEROBIC AND ANAEROBIC Blood Culture adequate volume   CULT  10/20/2022 1538    NO GROWTH 5 DAYS Performed at [facility], [address] [place], [place] [address]    REPTSTATUS 10/25/2022 FINAL 10/20/2022 1538    Cardiac Enzymes: No results for input(s): "CKTOTAL", "CKMB", "CKMBINDEX", "TROPONINI" in the last 168 hours. CBG: Recent Labs  Lab 10/31/22 1108 10/31/22 1552 10/31/22 1953 11/01/22 0728 11/01/22 1103  GLUCAP 184* 166* 221* 142* 230*   Iron Studies: No results for input(s): "IRON", "TIBC", "TRANSFERRIN", "FERRITIN" in the last 72 hours. Lab Results  Component Value Date   INR 1.1 04/14/2022   INR 1.1 09/17/2020   INR 0.9 04/04/2019   Studies/Results: No results found.  Medications:  sodium chloride 500 mL (10/28/22 1010)    (feeding supplement) PROSource Plus  30 mL Oral BID BM   amLODipine  5 mg Oral Daily   aspirin EC  81 mg Oral Daily   atorvastatin  80 mg Oral QPM  Chlorhexidine Gluconate Cloth  6 each Topical Daily   Chlorhexidine Gluconate Cloth  6 each Topical Q0600   feeding supplement (NEPRO CARB STEADY)  237 mL Oral Q24H   fluticasone furoate-vilanterol  1 puff Inhalation Daily   Gerhardt's butt cream   Topical BID   heparin  5,000 Units Subcutaneous Q8H   insulin aspart  0-15 Units Subcutaneous TID WC   insulin detemir  20 Units Subcutaneous Daily   methocarbamol  500 mg Oral BID   pantoprazole  40 mg Oral Daily   pregabalin  100 mg Oral BID   sucroferric oxyhydroxide  500 mg Oral TID WC   vancomycin  125 mg Oral QID    Dialysis Orders: TTS at [facility] -> last HD 4/4, cut time - post-weight 113.5kg 4:15hr, 450/A1.5, EDW 111.5kg, 2K/2Ca, LUE AVF, heparin 3000 unit bolus - Mircera IV q 2 weeks - appears to have missed last dose, last given 3/21 -  Venofer  IV weekly - Hectoral IV q HD  Assessment/Plan: AMS: ?Due to infection, doubt uremia. Head CT negative. Resolved, without altered mentation on examination. L foot osteomyelitis: Non-healing wound s/p L great toe amputation 09/08/22 - purulent drainage, x-Hounshell suspicious for early osteo. Completed 10 day course of IV vanc/ zosyn. Ortho has seen here, ABIs completed. Pt is s/p 1st Avery amputation on 4/17. C.Diff colitis: Improving on PO Vancomycin.  ESRD: Had missed 2 HD prior to admit. Now back on track, continue TTS schedule-on 3K bath with HD for now. K+ now 4.7. Recently received supplementation. Patient received HD overnight thankfully-2L removed. Next HD 4/23 in outpatient. 5.  Hypertension/volume: BP stable, below her prior EDW. Will lower at discharge. 6.  Anemia: Hgb > 12, no ESA needed for now. 7.  Metabolic bone disease: Ca ok, Phos controlled. Holding VDRA for now. On Velphoro. 8.  Nutrition: Alb low, continue supplements for wound healing. 9.  Dispo:  Okay for discharge from renal standpoint  Salome Holmes, NP [facility] 11/01/2022,11:18 AM  LOS: 12 days

## 2022-11-01 NOTE — Discharge Summary (Signed)
Physician Discharge Summary   Patient: [name] MRN: [id number] DOB: 07-Feb-1956  Admit date:     10/20/2022  Discharge date: 11/01/22  Discharge Physician: Thad Ranger, MD    PCP: Felix Pacini, FNP   Recommendations at discharge:   Continue oral vancomycin 125 mg p.o. 4 times daily for 12 more days  Discharge Diagnoses:    Acute metabolic encephalopathy-resolved  Osteomyelitis of left foot   Pancolitis, C. difficile colitis   COPD (chronic obstructive pulmonary disease)   Essential hypertension   Type 2 diabetes mellitus with chronic kidney disease, with long-term current use of insulin   Depression   CAD (coronary artery disease)   Hyperlipidemia   Chronic diastolic heart failure   ESRD (end stage renal disease)   PVD (peripheral vascular disease)   Prolonged QT interval     Hospital Course: Patient is a 67 year old female with IDDM, ESRD on HD TTS, HLP, CAD, HTN, GERD, hep C, depression presented on 10/20/2022 with altered mental status.  Per family, patient had told them a day before the admission but she did not feel well.  She fell on the morning of admission and was not down for about 3 hours.  In ED, she was noted to have left foot wound.   Left foot x-Klindt showed early possible osteomyelitis of first metatarsal head, pancolitis on the CT abdomen.    Assessment and Plan:   Acute metabolic encephalopathy -CT head, CT C-spine negative for any abnormality -Resolved, alert and oriented, appears at her baseline   Osteomyelitis of the left foot -MRI of the foot showed medial forefoot soft tissue wound with underlying early osteomyelitis of the residually first metatarsal head and medial sesamoid, small abscess 1.0x 0.6x 0.5 cm -Orthopedics was consulted.  ABIs within normal range - s/p left foot first Zarr amputation by Dr. Lajoyce Corners on 4/17 -Completed IV vancomycin and Rocephin -Plan for wound VAC for 1 week, touchdown weightbearing on the left, follow-up in  office in 1 week     C. difficile colitis, pancolitis -Patient was on ciprofloxacin and Flagyl until 3/15 after great toe amputation -Oral vancomycin started on 4/11, per patient diarrhea has improved -IV antibiotics were completed on 4/18, continue oral vancomycin for 12 more days to complete full course of 14 days     COPD (chronic obstructive pulmonary disease) -Currently stable, no acute wheezing      Essential hypertension -BP stable   ESRD on hemodialysis TTS -Patient underwent hemodialysis per her schedule     Type 2 diabetes mellitus with chronic kidney disease, with long-term current use of insulin -Hemoglobin A1c 8.8 on 07/23/2022 -Continue Levemir  Hypertension -Outpatient on Coreg and midodrine   CAD, hyperlipidemia - resume statin      Chronic diastolic heart failure -No acute issues, monitor management with HD     PVD (peripheral vascular disease) -Aspirin and statin resumed     Prolonged QT interval -Avoid QT prolonging meds   Obesity Estimated body mass index is 39.91 kg/m as calculated from the following:   Height as of this encounter:  (1.651 m).   Weight as of this encounter: 108.8 kg.       Pain control - [employer] Controlled Substance Reporting System database was reviewed. and patient was instructed, not to drive, operate heavy machinery, perform activities at heights, swimming or participation in water activities or provide baby-sitting services while on Pain, Sleep and Anxiety Medications; until their outpatient Physician has advised to do so again.  Also recommended to not to take more than prescribed Pain, Sleep and Anxiety Medications.  Consultants: Orthopedics Procedures performed:  LEFT FOOT 1ST Nichol AMPUTATION 10/28/2022 Disposition: Home Diet recommendation:  Discharge Diet Orders (From admission, onward)    Carb modified diet DISCHARGE MEDICATION: Allergies as of 11/01/2022       Reactions   Shellfish Allergy Anaphylaxis,  Swelling   Diazepam Other (See Comments)   "felt like out of body experience"   Morphine Itching, Other (See Comments)   Tolerated hydromorphone on 07/2020 *will take along with Benadryl*        Medication List     STOP taking these medications    allopurinol 100 MG tablet Commonly known as: ZYLOPRIM   NovoLIN N FlexPen 100 UNIT/ML Kiwkpen Generic drug: Insulin NPH (Human) (Isophane)       TAKE these medications    albuterol 108 (90 Base) MCG/ACT inhaler Commonly known as: VENTOLIN HFA Inhale 2 puffs into the lungs every 4 (four) hours as needed for wheezing or shortness of breath.   aspirin EC 81 MG tablet Take 1 tablet (81 mg total) by mouth daily.   atorvastatin 80 MG tablet Commonly known as: LIPITOR Take 1 tablet (80 mg total) by mouth every evening. What changed: when to take this   Blood Glucose Test Strips 333 Strp Enough test stripes to test blood sugar TID x 30 days. No refills   carvedilol 12.5 MG tablet Commonly known as: COREG Take 1 tablet (12.5 mg total) by mouth 2 (two) times daily with a meal.   fluticasone 50 MCG/ACT nasal spray Commonly known as: FLONASE Place 2 sprays into both nostrils daily as needed for allergies or rhinitis.   fluticasone furoate-vilanterol 200-25 MCG/INH Aepb Commonly known as: BREO ELLIPTA Inhale 1 puff into the lungs daily.   FreeStyle Libre 14 Day Reader Hardie Pulley To check blood sugar ACHS   FreeStyle Libre 14 Day Sensor Misc To check blood sugar ACHS; change every 14 days   HumuLIN 70/30 KwikPen (70-30) 100 UNIT/ML KwikPen Generic drug: insulin isophane & regular human KwikPen Inject 30 Units into the skin 2 (two) times daily.   hydrOXYzine 25 MG tablet Commonly known as: ATARAX Take 1 tablet (25 mg total) by mouth 4 (four) times daily. What changed: when to take this   Insulin Pen Needle 32G X 4 MM Misc Enough pen needles for 100 units of novolin QAM x 30 days. No refills   Levemir FlexPen 100 UNIT/ML  FlexPen Generic drug: insulin detemir Inject 25 Units into the skin daily. What changed: how much to take   methocarbamol 500 MG tablet Commonly known as: ROBAXIN Take 1 tablet (500 mg total) by mouth 2 (two) times daily.   midodrine 10 MG tablet Commonly known as: PROAMATINE Take 10 mg by mouth See admin instructions. Take 10 mg by mouth 30 minutes before dialysis on Tues/Thurs/Sat   naloxone 4 MG/0.1ML Liqd nasal spray kit Commonly known as: NARCAN Place 1 spray into the nose as directed.   nitroGLYCERIN 0.4 MG SL tablet Commonly known as: NITROSTAT Place 1 tablet (0.4 mg total) under the tongue every 5 (five) minutes x 3 doses as needed for chest pain.   Oxycodone HCl 10 MG Tabs Take 10 mg by mouth 4 (four) times daily.   pantoprazole 40 MG tablet Commonly known as: PROTONIX Take 40 mg by mouth daily before breakfast.   pregabalin 100 MG capsule Commonly known as: LYRICA Take 1 capsule (100 mg total) by mouth 2 (  two) times daily. What changed:  medication strength how much to take   torsemide 10 MG tablet Commonly known as: DEMADEX Take 10 mg by mouth See admin instructions. Take 10 mg by mouth every other morning   valACYclovir 500 MG tablet Commonly known as: VALTREX TAKE (1) TABLET BY MOUTH EVERY OTHER DAY. What changed: See the new instructions.   vancomycin 125 MG capsule Commonly known as: VANCOCIN Take 1 capsule (125 mg total) by mouth 4 (four) times daily for 12 days.   Velphoro 500 MG chewable tablet Generic drug: sucroferric oxyhydroxide Chew 1,500 mg by mouth 3 (three) times daily.               Durable Medical Equipment  (From admission, onward)           Start     Ordered   11/01/22 0915  For home use only DME standard manual wheelchair with seat cushion  Once       Comments: Patient suffers from gait instability, left foot surgery which impairs their ability to perform daily activities like bathing, grooming, toileting in the home.   A cane or walker will not resolve issue with performing activities of daily living. A wheelchair will allow patient to safely perform daily activities. Patient can safely propel the wheelchair in the home or has a caregiver who can provide assistance. Length of need 6 months . Accessories: elevating leg rests (ELRs), wheel locks, extensions and anti-tippers.   11/01/22 0915              Discharge Care Instructions  (From admission, onward)           Start     Ordered   11/01/22 0000  Leave dressing on - Keep it clean, dry, and intact until clinic visit        11/01/22 1610            Follow-up Information     Nadara Mustard, MD Follow up in 1 week(s).   Specialty: Orthopedic Surgery Contact information: [address] [place] [address] [phone]         [facility] Follow up.   Why: Call to schedule start of care service. Contact information: [address] [place] [address] [phone]         Felix Pacini, FNP. Schedule an appointment as soon as possible for a visit in 2 week(s).   Specialty: Endocrinology Why: for hospital follow-up Contact information: [address] [place] [address] [phone]         [facility] Follow up.   Why: (Adapt)- wheelchair arranged- to be delivered to room prior to discharge Contact information: [address] [facility] [place] [address] [phone]                Discharge Exam: Filed Weights   10/30/22 2307 10/31/22 0426 11/01/22 5366  Weight: 108.8 kg 108.8 kg 109.5 kg   S: No acute complaints, working with physical therapy today.  Needed wheelchair, has bedside commode at home.  Diarrhea resolved.  Pain controlled  BP (!) 145/58 (BP Location: Right Arm)   Pulse 92   Temp 98.1 F (36.7 C) (Oral)   Resp 17   Ht 5\' 5"  (1.651 m)   Wt 109.5 kg   SpO2 90%   BMI 40.17 kg/m   Physical Exam General: Alert and oriented x 3,  NAD Cardiovascular: S1 S2 clear, RRR.  Respiratory: CTAB,  no wheezing, rales or rhonchi Gastrointestinal: Soft, nontender, nondistended, NBS Ext: dressing intact left foot with wound VAC Neuro: no new deficits Psych: Normal affect   Condition at discharge: fair  The results of significant diagnostics from this hospitalization (including imaging, microbiology, ancillary and laboratory) are listed below for reference.   Imaging Studies: VAS [place] ABI WITH/WO TBI  Result Date: 10/22/2022  LOWER EXTREMITY DOPPLER STUDY Patient Name:  SHAROL CROGHAN  Date of Exam:   10/22/2022 Medical Rec #: [id number]      Accession #:    [id number] Date of Birth: 04/03/1956      Patient Gender: F Patient Age:   75 years Exam Location:  [facility] Procedure:      VAS [place] ABI WITH/WO TBI Referring Phys: MARCUS DUDA --------------------------------------------------------------------------------  Indications: Peripheral artery disease. Left great toe amputation with wound              dehiscence High Risk Factors: Diabetes, past history of smoking, coronary artery disease.  Limitations: Today's exam was limited due to Left dialysis access, left great              toe amputation. Comparison Study: No prior study on file Performing Technologist: Sherren Kerns RVS  Examination Guidelines: A complete evaluation includes at minimum, Doppler waveform signals and systolic blood pressure reading at the level of bilateral brachial, anterior tibial, and posterior tibial arteries, when vessel segments are accessible. Bilateral testing is considered an integral part of a complete examination. Photoelectric Plethysmograph (PPG) waveforms and toe systolic pressure readings are included as required and additional duplex testing as needed. Limited examinations for reoccurring indications may be performed as noted.  ABI Findings: +---------+------------------+-----+-----------+--------+ Right    Rt Pressure (mmHg)IndexWaveform    Comment  +---------+------------------+-----+-----------+--------+ Brachial 150                    triphasic           +---------+------------------+-----+-----------+--------+ PTA      187               1.25 multiphasic         +---------+------------------+-----+-----------+--------+ DP       170               1.13 multiphasic         +---------+------------------+-----+-----------+--------+ Great Toe149               0.99 Normal              +---------+------------------+-----+-----------+--------+ +---------+------------------+-----+-----------+-----------------------+ Left     Lt Pressure (mmHg)IndexWaveform   Comment                 +---------+------------------+-----+-----------+-----------------------+ Brachial                                   restricted              +---------+------------------+-----+-----------+-----------------------+ PTA      184               1.23 multiphasic                        +---------+------------------+-----+-----------+-----------------------+ DP       165               1.10 multiphasic                        +---------+------------------+-----+-----------+-----------------------+  Great Toe                                  3rd toe normal waveform +---------+------------------+-----+-----------+-----------------------+ +-------+-----------+-----------------------+------------+------------+ ABI/TBIToday's ABIToday's TBI            Previous ABIPrevious TBI +-------+-----------+-----------------------+------------+------------+ Right  1.25       0.99                                            +-------+-----------+-----------------------+------------+------------+ Left   1.23       normal waveform 3rd toe                         +-------+-----------+-----------------------+------------+------------+   Summary: Right: Resting right ankle-brachial index is within normal range. The right toe-brachial index is  normal. Left: Resting left ankle-brachial index is within normal range. *See table(s) above for measurements and observations.  Electronically signed by Heath Lark on 10/22/2022 at 4:52:29 PM.    Final    MR FOOT LEFT WO CONTRAST  Result Date: 10/22/2022 CLINICAL DATA:  Osteomyelitis, foot EXAM: MRI OF THE LEFT FOOT WITHOUT CONTRAST TECHNIQUE: Multiplanar, multisequence MR imaging of the left forefoot was performed. No intravenous contrast was administered. COMPARISON:  Left radiograph 10/30/2022 FINDINGS: Bones/Joint/Cartilage Postsurgical changes of great toe amputation. There is marrow edema in the residual first metatarsal head mildly low T1 marrow signal marrow edema and low T1 signal of the medial sesamoid. There is mild marrow edema within the second toe proximal phalanx and the second metatarsal head, with preserved T1 marrow signal. Minimal second MTP joint fluid. No other significant marrow signal alteration. Ligaments Intact Lisfranc ligament. Muscles and Tendons Chronic denervation changes. Postsurgical changes of the great toe tendons. Soft tissues Diffuse soft tissue swelling of the foot. Medial forefoot soft tissue wound which extends to the surface of the residual first metatarsal head. Small amount of coalescent fluid underlying the ulcer and along the surface of the first metatarsal head measuring 1.0 x 0.6 x 0.5 cm (series 6, image 36, series 9, image 8). IMPRESSION: Medial forefoot soft tissue wound with underlying early osteomyelitis of the residual first metatarsal head and medial sesamoid. Small amount of coalescent fluid underlying the ulcer and along the surface of the first metatarsal head measuring 1.0 x 0.6 x 0.5 cm, could be an abscess, correlate with drainage. Mild marrow edema within the second toe proximal phalanx and second metatarsal head with preserved T1 marrow signal, could reflect reactive marrow change or early osteomyelitis. Electronically Signed   By: Caprice Renshaw M.D.    On: 10/22/2022 07:51   CT ABDOMEN PELVIS W CONTRAST  Result Date: 10/20/2022 CLINICAL DATA:  Acute abdominal pain EXAM: CT ABDOMEN AND PELVIS WITH CONTRAST TECHNIQUE: Multidetector CT imaging of the abdomen and pelvis was performed using the standard protocol following bolus administration of intravenous contrast. RADIATION DOSE REDUCTION: This exam was performed according to the departmental dose-optimization program which includes automated exposure control, adjustment of the mA and/or kV according to patient size and/or use of iterative reconstruction technique. CONTRAST:  75mL OMNIPAQUE IOHEXOL 350 MG/ML SOLN COMPARISON:  CT abdomen and pelvis 05/26/2022 FINDINGS: Lower chest: There is atelectasis in the lung bases. Hepatobiliary: There is questionable nodular liver contour. Gallbladder surgically absent. No biliary ductal dilatation. Pancreas: Unremarkable. No pancreatic  ductal dilatation or surrounding inflammatory changes. Spleen: Normal in size without focal abnormality. Adrenals/Urinary Tract: There are numerous bilateral renal cortical cysts and hypodensities which are too small to characterize measuring up to 12 mm. There is no hydronephrosis or perinephric fat stranding. The adrenal glands and bladder are within normal limits. Stomach/Bowel: There is diffuse colonic wall thickening with surrounding inflammation more prominent in the ascending and transverse colon. There is no pneumatosis, bowel obstruction or free air. The appendix is not visualized small bowel and stomach are within normal limits. Stomach is within normal limits. Vascular/Lymphatic: Aortic atherosclerosis. No enlarged abdominal or pelvic lymph nodes. Reproductive: Uterus and bilateral adnexa are unremarkable. Other: Small fat containing umbilical hernia.  No ascites. Musculoskeletal: No acute fractures are seen. IMPRESSION: 1. Pancolitis. 2. Questionable nodular liver contour. Correlate clinically for cirrhosis. 3. Bilateral renal  cysts and hypodensities which are too small to characterize. No follow-up imaging necessary. Aortic Atherosclerosis (ICD10-I70.0). Electronically Signed   By: Darliss Cheney M.D.   On: 10/20/2022 17:29   CT Head Wo Contrast  Result Date: 10/20/2022 CLINICAL DATA:  Mental status change of unknown cause. Patient found on the floor. EXAM: CT HEAD WITHOUT CONTRAST CT CERVICAL SPINE WITHOUT CONTRAST TECHNIQUE: Multidetector CT imaging of the head and cervical spine was performed following the standard protocol without intravenous contrast. Multiplanar CT image reconstructions of the cervical spine were also generated. RADIATION DOSE REDUCTION: This exam was performed according to the departmental dose-optimization program which includes automated exposure control, adjustment of the mA and/or kV according to patient size and/or use of iterative reconstruction technique. COMPARISON:  05/26/2022. FINDINGS: CT HEAD FINDINGS Brain: No evidence of acute infarction, hemorrhage, hydrocephalus, extra-axial collection or mass lesion/mass effect. Vascular: No hyperdense vessel or unexpected calcification. Skull: Normal. Negative for fracture or focal lesion. Sinuses/Orbits: Globes and orbits are unremarkable. Mild mucosal thickening along the floor the right maxillary sinus. Sinuses otherwise clear. Other: None. CT CERVICAL SPINE FINDINGS Alignment: Slight reversal the normal cervical lordosis, apex at C5. No spondylolisthesis. Skull base and vertebrae: No acute fracture. No primary bone lesion or focal pathologic process. Soft tissues and spinal canal: No prevertebral fluid or swelling. No visible canal hematoma. Disc levels: Mild loss of disc height at C2-C3. Moderate loss of disc height at C5-C6 and C6-C7. Mild disc bulging. No convincing disc herniation. No significant stenosis. Upper chest: Upper lung ground-glass opacities suspected to be due to air trapping and small airways disease, but are nonspecific. Other: None.  IMPRESSION: HEAD CT 1. No acute intracranial abnormalities CERVICAL CT 1. No fracture or acute skeletal abnormality Electronically Signed   By: Amie Portland M.D.   On: 10/20/2022 17:29   CT Cervical Spine Wo Contrast  Result Date: 10/20/2022 CLINICAL DATA:  Mental status change of unknown cause. Patient found on the floor. EXAM: CT HEAD WITHOUT CONTRAST CT CERVICAL SPINE WITHOUT CONTRAST TECHNIQUE: Multidetector CT imaging of the head and cervical spine was performed following the standard protocol without intravenous contrast. Multiplanar CT image reconstructions of the cervical spine were also generated. RADIATION DOSE REDUCTION: This exam was performed according to the departmental dose-optimization program which includes automated exposure control, adjustment of the mA and/or kV according to patient size and/or use of iterative reconstruction technique. COMPARISON:  05/26/2022. FINDINGS: CT HEAD FINDINGS Brain: No evidence of acute infarction, hemorrhage, hydrocephalus, extra-axial collection or mass lesion/mass effect. Vascular: No hyperdense vessel or unexpected calcification. Skull: Normal. Negative for fracture or focal lesion. Sinuses/Orbits: Globes and orbits are unremarkable. Mild mucosal  thickening along the floor the right maxillary sinus. Sinuses otherwise clear. Other: None. CT CERVICAL SPINE FINDINGS Alignment: Slight reversal the normal cervical lordosis, apex at C5. No spondylolisthesis. Skull base and vertebrae: No acute fracture. No primary bone lesion or focal pathologic process. Soft tissues and spinal canal: No prevertebral fluid or swelling. No visible canal hematoma. Disc levels: Mild loss of disc height at C2-C3. Moderate loss of disc height at C5-C6 and C6-C7. Mild disc bulging. No convincing disc herniation. No significant stenosis. Upper chest: Upper lung ground-glass opacities suspected to be due to air trapping and small airways disease, but are nonspecific. Other: None.  IMPRESSION: HEAD CT 1. No acute intracranial abnormalities CERVICAL CT 1. No fracture or acute skeletal abnormality Electronically Signed   By: Amie Portland M.D.   On: 10/20/2022 17:29   DG Foot Complete Left  Result Date: 10/20/2022 CLINICAL DATA:  Foot infection. EXAM: LEFT FOOT - COMPLETE 3+ VIEW COMPARISON:  Left foot radiographs 07/22/2022 FINDINGS: Sequelae of interval great toe amputation are identified at the level of the MTP joint. There is gas/a wound within the overlying soft tissues at the amputation site. Subtle marginal erosions are questioned involving the first metatarsal head. There is a minimally displaced intra-articular fracture of the base of the proximal phalanx of the second toe which is new from the prior study but of indeterminate acuity. Diffuse soft tissue swelling is noted in the forefoot. There are atherosclerotic vascular calcifications. IMPRESSION: 1. Interval great toe amputation with overlying soft tissue wound. Questionable subtle marginal erosion at the first metatarsal head for which early osteomyelitis cannot be excluded. 2. Minimally displaced intra-articular fracture of the base of the second toe proximal phalanx, of indeterminate acuity. Electronically Signed   By: [name] M.D.   On: 10/20/2022 16:18   DG Chest 2 View  Result Date: 10/20/2022 CLINICAL DATA:  Altered mental status.  Left foot infection. EXAM: CHEST - 2 VIEW COMPARISON:  07/22/2022 FINDINGS: Mild elevation of the right hemidiaphragm is unchanged. Chronic linear densities at both lung bases are suggestive for areas of scarring. No new airspace disease or lung consolidation. Trachea is midline. Heart and mediastinum are within normal limits. Atherosclerotic calcifications at the aortic arch. No large pleural effusions. IMPRESSION: Bibasilar chest densities appear to be chronic and most compatible with areas of scarring. No acute chest findings. Electronically Signed   By: Richarda Overlie M.D.   On:  10/20/2022 16:11    Microbiology: Results for orders placed or performed during the hospital encounter of 10/20/22  Blood culture (routine x 2)     Status: None   Collection Time: 10/20/22  3:17 PM   Specimen: BLOOD  Result Value Ref Range Status   Specimen Description BLOOD BLOOD RIGHT FOREARM  Final   Special Requests   Final    BOTTLES DRAWN AEROBIC AND ANAEROBIC Blood Culture results may not be optimal due to an inadequate volume of blood received in culture bottles   Culture   Final    NO GROWTH 5 DAYS Performed at [facility], [address], [place], [place] [address]    Report Status 10/25/2022 FINAL  Final  Urine Culture     Status: None   Collection Time: 10/20/22  3:18 PM   Specimen: Urine, Clean Catch  Result Value Ref Range Status   Specimen Description URINE, CLEAN CATCH  Final   Special Requests NONE  Final   Culture   Final    NO GROWTH Performed  at [facility] Lab, [address] [place], [place] [address]    Report Status 10/21/2022 FINAL  Final  Blood culture (routine x 2)     Status: None   Collection Time: 10/20/22  3:38 PM   Specimen: BLOOD RIGHT ARM  Result Value Ref Range Status   Specimen Description BLOOD RIGHT ARM  Final   Special Requests   Final    BOTTLES DRAWN AEROBIC AND ANAEROBIC Blood Culture adequate volume   Culture   Final    NO GROWTH 5 DAYS Performed at [facility] Lab, [address], [place], [place] [address]    Report Status 10/25/2022 FINAL  Final  MRSA Next Gen by PCR, Nasal     Status: Abnormal   Collection Time: 10/21/22  5:26 AM   Specimen: Nasal Mucosa; Nasal Swab  Result Value Ref Range Status   MRSA by PCR Next Gen DETECTED (A) NOT DETECTED Final    Comment: RESULT CALLED TO, READ BACK BY AND VERIFIED WITH: T. VAUGHN 10/21/22 @ 0725 BY AB (NOTE) The GeneXpert MRSA Assay (FDA approved for NASAL specimens only), is one component of a comprehensive MRSA colonization surveillance program. It is not intended  to diagnose MRSA infection nor to guide or monitor treatment for MRSA infections. Test performance is not FDA approved in patients less than 37 years old. Performed at [facility] Lab, [address] [place], [place] [address]   Gastrointestinal Panel by PCR , Stool     Status: None   Collection Time: 10/22/22 11:38 AM   Specimen: Stool  Result Value Ref Range Status   Campylobacter species NOT DETECTED NOT DETECTED Final   Plesimonas shigelloides NOT DETECTED NOT DETECTED Final   Salmonella species NOT DETECTED NOT DETECTED Final   Yersinia enterocolitica NOT DETECTED NOT DETECTED Final   Vibrio species NOT DETECTED NOT DETECTED Final   Vibrio cholerae NOT DETECTED NOT DETECTED Final   Enteroaggregative E coli (EAEC) NOT DETECTED NOT DETECTED Final   Enteropathogenic E coli (EPEC) NOT DETECTED NOT DETECTED Final   Enterotoxigenic E coli (ETEC) NOT DETECTED NOT DETECTED Final   Shiga like toxin producing E coli (STEC) NOT DETECTED NOT DETECTED Final   Shigella/Enteroinvasive E coli (EIEC) NOT DETECTED NOT DETECTED Final   Cryptosporidium NOT DETECTED NOT DETECTED Final   Cyclospora cayetanensis NOT DETECTED NOT DETECTED Final   Entamoeba histolytica NOT DETECTED NOT DETECTED Final   Giardia lamblia NOT DETECTED NOT DETECTED Final   Adenovirus F40/41 NOT DETECTED NOT DETECTED Final   Astrovirus NOT DETECTED NOT DETECTED Final   Norovirus GI/GII NOT DETECTED NOT DETECTED Final   Rotavirus A NOT DETECTED NOT DETECTED Final   Sapovirus (I, II, IV, and V) NOT DETECTED NOT DETECTED Final    Comment: Performed at [facility], [address] [place], [place] [address]  C Difficile Quick Screen w PCR reflex     Status: Abnormal   Collection Time: 10/22/22 11:38 AM   Specimen: Stool  Result Value Ref Range Status   C Diff antigen POSITIVE (A) NEGATIVE Final   C Diff toxin POSITIVE (A) NEGATIVE Final   C Diff interpretation Toxin producing C. difficile detected.  Final     Comment: CRITICAL RESULT CALLED TO, READ BACK BY AND VERIFIED WITH: RN KATRINA [facility] ON 10/22/22 @ 1631 BY DRT Performed at [facility] Lab, [address], [place], [place] [address]    *Note: Due to a large number of results and/or encounters  for the requested time period, some results have not been displayed. A complete set of results can be found in Results Review.    Labs: CBC: Recent Labs  Lab 10/26/22 0056 10/27/22 0242 10/28/22 0253 10/29/22 0233 10/30/22 0255 11/01/22 0722  WBC 6.0 5.4 6.3 7.3 9.1 6.9  NEUTROABS 3.4 2.8 3.8  --   --  5.0  HGB 12.9 12.4 13.3 12.2 12.7 12.6  HCT 40.7 41.2 41.7 41.3 40.0 40.7  MCV 88.5 90.4 88.3 92.6 89.3 90.4  PLT 166 169 153 155 146* 114*   Basic Metabolic Panel: Recent Labs  Lab 10/26/22 0056 10/27/22 0242 10/28/22 0253 10/29/22 0233 10/30/22 0255 11/01/22 0722  NA 135 133* 133* 134* 134* 132*  K 3.3* 3.7 3.9 4.5 4.7 4.0  CL 95* 97* 96* 99 92* 95*  CO2 25 22 26 23 26 25   GLUCOSE 136* 139* 144* 159* 213* 156*  BUN 32* 44* 30* 44* 26* 23  CREATININE 7.84* 8.91* 6.51* 8.00* 5.75* 4.74*  CALCIUM 8.9 8.3* 9.2 9.1 9.4 8.9  PHOS 5.3* 5.4* 4.4  --   --  3.0   Liver Function Tests: Recent Labs  Lab 10/26/22 0056 10/27/22 0242 10/28/22 0253 11/01/22 0722  ALBUMIN 3.1* 3.0* 3.1* 3.0*   CBG: Recent Labs  Lab 10/31/22 0714 10/31/22 1108 10/31/22 1552 10/31/22 1953 11/01/22 0728  GLUCAP 190* 184* 166* 221* 142*    Discharge time spent: greater than 30 minutes.  Signed: Thad Ranger, MD [facility] 11/01/2022

## 2022-11-01 NOTE — TOC Transition Note (Signed)
Transition of Care (TOC) - CM/SW Discharge Note Donn Pierini RN, BSN Transitions of Care Unit 4E- RN Case Manager See Treatment Team for direct phone # [phone] weeekend coverage  Patient Details  Name: [name] MRN: [id number] Date of Birth: 08-23-1955  Transition of Care [facility]) CM/SW Contact:  Darrold Span, RN Phone Number: 11/01/2022, 9:36 AM   Clinical Narrative:    Pt stable for transition home today, HH has been set up with [facility], order for w/c placed.   CM spoke with pt over TC in the room. Pt voiced she has spoken with [facility] agency who was scheduled to visit today however will not be able to make same day visit on discharge and plans to see pt tomorrow.  Per pt she has her prevena wound VAC.   Discussed w/c need with pt- choice offered for DME agency- pt voiced she does not have a preference only wants one that can deliver prior to discharge.  Agreeable to use in house provider- [facility].   Voiced she will call for ride once all discharge items in place.   Call made to [facility]- Jasmine- w/c order referral given- once processed they will deliver to room prior to discharge today.    Final next level of care: Home w Home Health Services Barriers to Discharge: Barriers Resolved   Patient Goals and CMS Choice CMS Medicare.gov Compare Post Acute Care list provided to:: Patient Choice offered to / list presented to : Patient, Adult Children  Discharge Placement               Home w/ [facility]          Discharge Plan and Services Additional resources added to the After Visit Summary for     Discharge Planning Services: CM Consult Post Acute Care Choice: Home Health, Durable Medical Equipment, Resumption of Svcs/PTA Provider          DME Arranged: Wheelchair manual DME Agency: [facility] Date DME Agency Contacted: 11/01/22 Time DME Agency Contacted: 0930 Representative spoke with at DME Agency: Leavy Cella HH Arranged: RN, PT HH Agency: Other - See comment ([facility] Date [facility] Agency Contacted: 11/01/22   Representative spoke with at [facility] Agency: pt spoke with agency  Social Determinants of Health (SDOH) Interventions SDOH Screenings   Food Insecurity: Food Insecurity Present (10/22/2022)  Housing: Low Risk  (07/23/2022)  Transportation Needs: No Transportation Needs (10/22/2022)  Utilities: Not At Risk (10/22/2022)  Alcohol Screen: Low Risk  (03/03/2021)  Depression (PHQ2-9): High Risk (03/03/2021)  Financial Resource Strain: Low Risk  (12/26/2019)  Physical Activity: Inactive (12/26/2019)  Social Connections: Socially Isolated (12/26/2019)  Stress: Stress Concern Present (12/26/2019)  Tobacco Use: Medium Risk (10/29/2022)     Readmission Risk Interventions    11/01/2022    9:36 AM  Readmission Risk Prevention Plan  Transportation Screening Complete  PCP or Specialist Appt within 3-5 Days Complete  HRI or Home Care Consult Complete  Social Work Consult for Recovery Care Planning/Counseling Complete  Palliative Care Screening Not Applicable  Medication Review Oceanographer) Complete

## 2022-11-01 NOTE — Progress Notes (Signed)
   Durable Medical Equipment (From admission, onward)        Start     Ordered  11/01/22 0915  For home use only DME standard manual wheelchair with seat cushion  Once      Comments: Patient suffers from gait instability, left foot surgery which impairs their ability to perform daily activities like bathing, grooming, toileting in the home.  A cane or walker will not resolve issue with performing activities of daily living. A wheelchair will allow patient to safely perform daily activities. Patient can safely propel the wheelchair in the home or has a caregiver who can provide assistance. Length of need 6 months . Accessories: elevating leg rests (ELRs), wheel locks, extensions and anti-tippers. Back cushion  11/01/22 0915

## 2022-11-01 NOTE — Progress Notes (Addendum)
Occupational Therapy Treatment Patient Details Name: [name] MRN: [id number] DOB: March 20, 1956 Today's Date: 11/01/2022   History of present illness Pt is a 67 y/o female who presents 10/20/22 from home after missed HD and fall at home. Pt with AMS and noted to have dehiscence great toe amputation on the L foot (~1 month ago performed in [place]). 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 Lajoyce Corners. PMH significant for aortic stenosis, CAD, CHF, CKD on HD, DM, Hep C, HTN, MI.   OT comments  Pt progressing gradually towards OT goals though limited by reported pain in L foot. Pt able to progress in-room mobility with RW for first time today though would still benefit from a wheelchair for use inside/outside of her apartment due to fatigue/WB precautions. Pt continues to require assist for LB ADLs including darco shoe mgmt. Pt reports her grandson will be staying with her and can provide assist with these tasks as needed. Set up sound vac case and strap to allow for easier mgmt w/ mobility.   Recommendations for follow up therapy are one component of a multi-disciplinary discharge planning process, led by the attending physician.  Recommendations may be updated based on patient status, additional functional criteria and insurance authorization.    Assistance Recommended at Discharge Frequent or constant Supervision/Assistance  Patient can return home with the following  A little help with walking and/or transfers;A lot of help with bathing/dressing/bathroom;Assistance with cooking/housework;Assist for transportation;Help with stairs or ramp for entrance   Equipment Recommendations  Wheelchair (measurements OT);Wheelchair cushion (measurements OT)    Recommendations for Other Services      Precautions / Restrictions Precautions Precautions: Fall Precaution Comments: L foot wound vac Required Braces or Orthoses: Other Brace Other Brace: Has DARCO shoe Restrictions Weight  Bearing Restrictions: Yes LLE Weight Bearing: Touchdown weight bearing       Mobility Bed Mobility Overal bed mobility: Modified Independent Bed Mobility: Supine to Sit                Transfers Overall transfer level: Needs assistance Equipment used: Rolling walker (2 wheels) Transfers: Sit to/from Stand, Bed to chair/wheelchair/BSC Sit to Stand: Min assist     Step pivot transfers: Min guard     General transfer comment: MIn A to stand when pt pushing up with one UE, pivot to chair for rest break before progressing to take steps. min guard to stand from recliner when pushing up with B UE     Balance Overall balance assessment: Needs assistance Sitting-balance support: No upper extremity supported, Feet supported Sitting balance-Leahy Scale: Good     Standing balance support: Bilateral upper extremity supported Standing balance-Leahy Scale: Poor                             ADL either performed or assessed with clinical judgement   ADL Overall ADL's : Needs assistance/impaired Eating/Feeding: Independent;Sitting                   Lower Body Dressing: Moderate assistance;Sitting/lateral leans;Sit to/from stand Lower Body Dressing Details (indicate cue type and reason): assist for darco shoe and tennis shoe mgmt due to pain. Pt reports grandson can assist with this at home             Functional mobility during ADLs: Min guard;Rolling walker (2 wheels) General ADL Comments: Was able to take a few steps in room with RW for first  time today, limited by pain. discussed LE elevation, use of DME, incline to/from parking deck at apartment and apartment accesibility for w/c    Extremity/Trunk Assessment Upper Extremity Assessment Upper Extremity Assessment: Overall WFL for tasks assessed   Lower Extremity Assessment Lower Extremity Assessment: Defer to PT evaluation        Vision   Vision Assessment?: No apparent visual deficits    Perception     Praxis      Cognition Arousal/Alertness: Awake/alert Behavior During Therapy: WFL for tasks assessed/performed Overall Cognitive Status: Within Functional Limits for tasks assessed                                          Exercises      Shoulder Instructions       General Comments Pt answered 2 phone calls during session and MD entering as well; plans to put in w/c order    Pertinent Vitals/ Pain       Pain Assessment Pain Assessment: Faces Faces Pain Scale: Hurts little more Pain Location: L foot Pain Descriptors / Indicators: Operative site guarding, Discomfort Pain Intervention(s): Monitored during session  Home Living                                          Prior Functioning/Environment              Frequency  Min 2X/week        Progress Toward Goals  OT Goals(current goals can now be found in the care plan section)  Progress towards OT goals: Progressing toward goals  Acute Rehab OT Goals OT Goal Formulation: With patient Time For Goal Achievement: 11/13/22 Potential to Achieve Goals: Good ADL Goals Pt Will Perform Lower Body Bathing: with modified independence;sit to/from stand;with adaptive equipment Pt Will Perform Lower Body Dressing: with modified independence;sitting/lateral leans;with adaptive equipment Pt Will Transfer to Toilet: ambulating;bedside commode;with supervision Pt Will Perform Toileting - Clothing Manipulation and hygiene: with modified independence;sitting/lateral leans  Plan Discharge plan remains appropriate    Co-evaluation                 AM-PAC OT "6 Clicks" Daily Activity     Outcome Measure   Help from another person eating meals?: None Help from another person taking care of personal grooming?: A Little Help from another person toileting, which includes using toliet, bedpan, or urinal?: A Little Help from another person bathing (including washing, rinsing,  drying)?: A Lot Help from another person to put on and taking off regular upper body clothing?: A Little Help from another person to put on and taking off regular lower body clothing?: A Lot 6 Click Score: 17    End of Session Equipment Utilized During Treatment: Rolling walker (2 wheels)  OT Visit Diagnosis: Unsteadiness on feet (R26.81);Pain;Muscle weakness (generalized) (M62.81) Pain - Right/Left: Left Pain - part of body: Ankle and joints of foot   Activity Tolerance Patient tolerated treatment well   Patient Left in chair;with call bell/phone within reach;with chair alarm set   Nurse Communication Mobility status        Time: 0822-0900 OT Time Calculation (min): 38 min  Charges: OT General Charges $OT Visit: 1 Visit OT Treatments $Self Care/Home Management : 8-22 mins $Therapeutic Activity: 8-22 mins  Raynelle Fanning  B, OTR/L Acute Rehab Services Office: [phone]   Lorre Munroe 11/01/2022, 10:08 AM

## 2022-11-01 NOTE — Progress Notes (Signed)
DISCHARGE NOTE HOME [name] to be discharged Home per MD order. Discussed prescriptions and follow up appointments with the patient. Prescriptions given to patient; medication list explained in detail. Patient verbalized understanding.  Skin clean, dry and intact without evidence of skin break down, no evidence of skin tears noted. IV catheter discontinued intact. Site without signs and symptoms of complications. Dressing and pressure applied. Pt denies pain at the site currently. No complaints noted.  Patient free of lines, drains, and wounds.   An After Visit Summary (AVS) was printed and given to the patient. Patient escorted via wheelchair, and discharged home via private auto.  [name], LPN

## 2022-11-02 ENCOUNTER — Other Ambulatory Visit: Payer: Self-pay | Admitting: Orthopedic Surgery

## 2022-11-02 ENCOUNTER — Telehealth: Payer: Self-pay | Admitting: Orthopedic Surgery

## 2022-11-02 MED ORDER — METHOCARBAMOL 500 MG PO TABS: 500.0000 mg | ORAL_TABLET | Freq: Two times a day (BID) | ORAL | 0 refills | Status: DC

## 2022-11-02 MED ORDER — VANCOMYCIN HCL 125 MG PO CAPS: 125.0000 mg | ORAL_CAPSULE | Freq: Four times a day (QID) | ORAL | 0 refills | Status: DC

## 2022-11-02 MED ORDER — PREGABALIN 100 MG PO CAPS: 100.0000 mg | ORAL_CAPSULE | Freq: Two times a day (BID) | ORAL | 0 refills | Status: DC

## 2022-11-02 MED ORDER — LEVEMIR FLEXPEN 100 UNIT/ML ~~LOC~~ SOPN: 25.0000 [IU] | PEN_INJECTOR | Freq: Every day | SUBCUTANEOUS | 11 refills | Status: DC

## 2022-11-02 NOTE — Telephone Encounter (Signed)
[facility] [place] in her chart. S/p left foot first Stayer amputation 10/28/22 Hospitalist sent in yesterday prior to discharge lyrica, robaxin, and levimir. They were sent to wrong pharmacy. Can you [facility] to [facility]? She has her first post op appt this week.

## 2022-11-02 NOTE — Telephone Encounter (Signed)
Patient called advised [facility] in [place]. Patient said she lives in [place] and will need [facility] [place]. [address]    The number to contact patient is [phone]

## 2022-11-02 NOTE — Telephone Encounter (Signed)
I [facility] also [facility] per Lajoyce Corners okay.

## 2022-11-02 NOTE — Progress Notes (Signed)
Late Note Entry  Pt was d/c yesterday. Contacted [facility] this am to advise clinic of pt's d/c date and that pt should resume care tomorrow.   Olivia Canter Renal Navigator [phone]

## 2022-11-04 ENCOUNTER — Ambulatory Visit (INDEPENDENT_AMBULATORY_CARE_PROVIDER_SITE_OTHER): Payer: 59 | Admitting: Family

## 2022-11-04 ENCOUNTER — Telehealth: Payer: Self-pay | Admitting: Orthopedic Surgery

## 2022-11-04 ENCOUNTER — Encounter: Payer: Self-pay | Admitting: Family

## 2022-11-04 DIAGNOSIS — Z89412 Acquired absence of left great toe: Secondary | ICD-10-CM

## 2022-11-04 NOTE — Telephone Encounter (Signed)
I called sw Nyisha to advise that pt was in the office today and advised to cleanse the area with dial soap and apply dry dressing daily. Has f/u on 11/18/2022 and can call if needs to be seen in the office sooner.

## 2022-11-04 NOTE — Progress Notes (Signed)
Post-Op Visit Note   Patient: [name]           Date of Birth: 1955/09/20           MRN: [id number] Visit Date: 11/04/2022 PCP: Felix Pacini, FNP  Chief Complaint:  Chief Complaint  Patient presents with   Left Foot - Routine Post Op    10/28/22 left first [place] amputation    HPI:  HPI The patient is a 67 year old woman seen status post left first [place] amputation.  In on April 17.  Wound VAC removed today. Ortho Exam On examination of the left foot the incision is well-approximated with sutures there is no gaping or drainage some surrounding maceration.  Visit Diagnoses: No diagnosis found.  Plan: Begin daily Dial soap cleansing.  Dry dressings.  Nonweightbearing.  Follow-up in 2 weeks for suture removal  Follow-Up Instructions: No follow-ups on file.   Imaging: No results found.  Orders:  No orders of the defined types were placed in this encounter.  No orders of the defined types were placed in this encounter.    PMFS History: Patient Active Problem List   Diagnosis Date Noted   Pancolitis 10/21/2022   Osteomyelitis of left foot 10/21/2022   Acute encephalopathy 10/20/2022   Cellulitis of left lower extremity 07/23/2022   Type 2 diabetes mellitus with chronic kidney disease, with long-term current use of insulin 07/23/2022   GERD without esophagitis 07/23/2022   Peripheral neuropathy 07/23/2022   GI bleed 09/17/2021   Severe sepsis 04/21/2021   COVID-19 virus infection 04/20/2021   Upper GI bleed 09/17/2020   Prolonged QT interval 09/17/2020   COPD (chronic obstructive pulmonary disease) 09/17/2020   Acute pulmonary edema 09/14/2020   Acute respiratory failure with hypoxia 09/13/2020   Allergy, unspecified, initial encounter 03/29/2020   Anaphylactic shock, unspecified, initial encounter 03/29/2020   Volume overload 01/30/2020   Pain in left upper arm 01/18/2020   Cellulitis 01/16/2020   Anemia due to end stage renal disease 01/16/2020    Diabetic polyneuropathy associated with type 2 diabetes mellitus 11/06/2019   ESRD on hemodialysis 11/06/2019   Nystagmus 11/06/2019   PVD (peripheral vascular disease) 11/06/2019   Vertigo of central origin 11/06/2019   Weakness    Hypervolemia associated with renal insufficiency 10/31/2019   BMI 45.0-49.9, adult 10/18/2019   Ascending aortic aneurysm 03/27/2019   Puncture wound without foreign body of left forearm, initial encounter 01/02/2019   Non-healing wound of upper extremity 12/28/2018   Unspecified open wound of left upper arm, initial encounter 12/06/2018   Complication from renal dialysis device 10/20/2018   Iron deficiency anemia, unspecified 10/18/2018   ESRD (end stage renal disease) 06/01/2018   ARF (acute renal failure) 05/09/2018   Colon cancer screening    LGSIL on Pap smear of cervix 01/27/2018   Gout 12/25/2017   AKI (acute kidney injury) 12/24/2017   Hyperglycemia 12/24/2017   Acute on chronic diastolic CHF (congestive heart failure) 10/03/2017   Chronic diastolic heart failure 09/30/2017   Lymphedema 09/30/2017   Acute metabolic encephalopathy 06/02/2017   Aortic stenosis    Acute pain of both knees 02/12/2017   Chronic gout due to renal impairment of multiple sites without tophus 02/12/2017   Esophageal dysphagia 02/12/2017   Osteoarthritis 01/22/2017   Diabetic hyperosmolar non-ketotic state 12/13/2016   CAD (coronary artery disease) 12/13/2016   Hyperlipidemia 12/13/2016   Hyperphosphatemia 12/13/2016   Acute diastolic CHF (congestive heart failure)    Hyperkalemia 08/21/2016   Asthma  11/29/2015   Depression 09/05/2015   GERD (gastroesophageal reflux disease) 03/25/2015   Environmental allergies 03/14/2015   SOB (shortness of breath) 02/15/2015   Chronic hepatitis C without hepatic coma 01/31/2015   Diabetic neuropathy 01/31/2015   OSA (obstructive sleep apnea) 01/01/2015   Poor dentition 11/13/2014   Essential hypertension 10/08/2014   Morbid  (severe) obesity due to excess calories 10/08/2014   Shortness of breath    Localized, primary osteoarthritis 01/25/2014   Family history of diabetes mellitus 03/31/2013   Pain in limb 03/31/2013   Uncontrolled type 2 diabetes mellitus with hyperosmolar nonketotic hyperglycemia 03/25/2012    Class: Chronic   Past Medical History:  Diagnosis Date   Anemia    Aortic stenosis    Echo 8/18: mean 13, peak 28, LVOT/AV mean velocity 0.51   Arthritis    Asthma    As a child    Bronchitis    CAD (coronary artery disease)    a. 09/2016: 50% Ost 1st Mrg stenosis, 50% 2nd Mrg stenosis, 20% Mid-Cx, 95% Prox LAD, 40% mid-LAD, and 10% dist-LAD stenosis. Staged PCI with DES to Prox-LAD.    Chronic combined systolic and diastolic CHF (congestive heart failure) 2011   echo 2/18: EF 55-60, normal wall motion, grade 2 diastolic dysfunction, trivial AI // echo 3/18: Septal and apical HK, EF 45-50, normal wall motion, trivial AI, mild LAE, PASP 38 // echo 8/18: EF 60-65, normal wall motion, grade 1 diastolic dysfunction, calcified aortic valve leaflets, mild aortic stenosis (mean 13, peak 28, LVOT/AV mean velocity 0.51), mild AI, moderate MAC, mild LAE, trivial TR    Chronic kidney disease    STAGE 4   Chronic kidney disease on chronic dialysis    t, th, sat   Complication of anesthesia    Depression    Diabetes mellitus Dx 1989   Elevated lipids    GERD (gastroesophageal reflux disease)    Gout    Heart murmur    asymptomatic   Hepatitis C Dx 2013   Hypertension Dx 1989   Infected surgical wound    Lt arm   Myocardial infarction 07/2015   Obesity    Pancreatitis 2013   Pneumonia    Refusal of blood transfusions as patient is [employer] Witness    pt states she is not [place] witness and does not refuse blood products   Tendinitis    Tremors of nervous system    LEFT HAND   Ulcer 2010    Family History  Problem Relation Age of Onset   Colon cancer Mother    Heart attack Other    Heart  attack Maternal Grandmother    Hypertension Sister    Hypertension Brother    Diabetes Paternal Grandmother    Breast cancer Neg Hx     Past Surgical History:  Procedure Laterality Date   A/V FISTULAGRAM Left 04/11/2019   Procedure: A/V FISTULAGRAM;  Surgeon: Renford Dills, MD;  Location: [facility];  Service: Cardiovascular;  Laterality: Left;   A/V FISTULAGRAM Left 06/02/2019   Procedure: A/V FISTULAGRAM;  Surgeon: Renford Dills, MD;  Location: [facility];  Service: Cardiovascular;  Laterality: Left;   AMPUTATION Left 10/28/2022   Procedure: LEFT FOOT 1ST [place] AMPUTATION;  Surgeon: Nadara Mustard, MD;  Location: [facility] OR;  Service: Orthopedics;  Laterality: Left;   APPLICATION OF WOUND VAC Left 08/14/2017   Procedure: APPLICATION OF WOUND VAC Exchange;  Surgeon: Earline Mayotte, MD;  Location: [facility] ORS;  Service: General;  Laterality: Left;   APPLICATION OF WOUND VAC Left 12/21/2018   Procedure: APPLICATION OF WOUND VAC;  Surgeon: Renford Dills, MD;  Location: [facility];  Service: Vascular;  Laterality: Left;   AV FISTULA PLACEMENT Left 08/19/2018   Procedure: ARTERIOVENOUS (AV) FISTULA CREATION ( BRACHIOBASILIC );  Surgeon: Renford Dills, MD;  Location: [facility];  Service: Vascular;  Laterality: Left;   BASCILIC VEIN TRANSPOSITION Left 11/18/2018   Procedure: BASCILIC VEIN TRANSPOSITION;  Surgeon: Renford Dills, MD;  Location: [facility];  Service: Vascular;  Laterality: Left;   BIOPSY  09/20/2020   Procedure: BIOPSY;  Surgeon: Jeani Hawking, MD;  Location: [facility] ENDOSCOPY;  Service: Endoscopy;;   CHOLECYSTECTOMY     COLONOSCOPY WITH PROPOFOL N/A 02/03/2018   Procedure: COLONOSCOPY WITH PROPOFOL;  Surgeon: Toney Reil, MD;  Location: [facility] ENDOSCOPY;  Service: Gastroenterology;  Laterality: N/A;   COLONOSCOPY WITH PROPOFOL N/A 07/25/2022   Procedure: COLONOSCOPY WITH PROPOFOL;  Surgeon: Regis Bill, MD;  Location: [facility] ENDOSCOPY;  Service:  Endoscopy;  Laterality: N/A;   CORONARY ANGIOPLASTY  07/2015   STENT   CORONARY STENT INTERVENTION N/A 09/18/2016   Procedure: Coronary Stent Intervention;  Surgeon: Lennette Bihari, MD;  Location: [facility];  Service: Cardiovascular;  Laterality: N/A;   DIALYSIS/PERMA CATHETER INSERTION N/A 05/10/2018   Procedure: DIALYSIS/PERMA CATHETER INSERTION;  Surgeon: Renford Dills, MD;  Location: [facility];  Service: Cardiovascular;  Laterality: N/A;   DRESSING CHANGE UNDER ANESTHESIA Left 08/15/2017   Procedure: exploration of wound for bleeding;  Surgeon: Earline Mayotte, MD;  Location: [facility];  Service: General;  Laterality: Left;   ESOPHAGOGASTRODUODENOSCOPY (EGD) WITH PROPOFOL N/A 02/03/2018   Procedure: ESOPHAGOGASTRODUODENOSCOPY (EGD) WITH PROPOFOL;  Surgeon: Toney Reil, MD;  Location: [facility] ENDOSCOPY;  Service: Gastroenterology;  Laterality: N/A;   ESOPHAGOGASTRODUODENOSCOPY (EGD) WITH PROPOFOL N/A 09/20/2020   Procedure: ESOPHAGOGASTRODUODENOSCOPY (EGD) WITH PROPOFOL;  Surgeon: Jeani Hawking, MD;  Location: [facility] ENDOSCOPY;  Service: Endoscopy;  Laterality: N/A;   ESOPHAGOGASTRODUODENOSCOPY (EGD) WITH PROPOFOL N/A 07/25/2022   Procedure: ESOPHAGOGASTRODUODENOSCOPY (EGD) WITH PROPOFOL;  Surgeon: Regis Bill, MD;  Location: [facility] ENDOSCOPY;  Service: Endoscopy;  Laterality: N/A;   EYE SURGERY  11/17/2018   INCISION AND DRAINAGE ABSCESS Left 08/12/2017   Procedure: INCISION AND DRAINAGE ABSCESS;  Surgeon: Earline Mayotte, MD;  Location: [facility];  Service: General;  Laterality: Left;   KNEE ARTHROSCOPY     LEFT HEART CATH N/A 09/18/2016   Procedure: Left Heart Cath;  Surgeon: Lennette Bihari, MD;  Location: [facility];  Service: Cardiovascular;  Laterality: N/A;   LEFT HEART CATH AND CORONARY ANGIOGRAPHY N/A 09/16/2016   Procedure: Left Heart Cath and Coronary Angiography;  Surgeon: Kathleene Hazel, MD;  Location: [facility] INVASIVE CV LAB;  Service:  Cardiovascular;  Laterality: N/A;   LEFT HEART CATH AND CORONARY ANGIOGRAPHY N/A 04/29/2017   Procedure: LEFT HEART CATH AND CORONARY ANGIOGRAPHY;  Surgeon: Yvonne Kendall, MD;  Location: [facility];  Service: Cardiovascular;  Laterality: N/A;   LOWER EXTREMITY ANGIOGRAPHY Right 03/08/2018   Procedure: LOWER EXTREMITY ANGIOGRAPHY;  Surgeon: Renford Dills, MD;  Location: [facility];  Service: Cardiovascular;  Laterality: Right;   TUBAL LIGATION     TUBAL LIGATION     UPPER EXTREMITY ANGIOGRAPHY Right 09/19/2019   Procedure: UPPER EXTREMITY ANGIOGRAPHY;  Surgeon: Renford Dills, MD;  Location: [facility];  Service: Cardiovascular;  Laterality: Right;  WOUND DEBRIDEMENT Left 12/21/2018   Procedure: DEBRIDEMENT WOUND;  Surgeon: Renford Dills, MD;  Location: [facility];  Service: Vascular;  Laterality: Left;   WOUND DEBRIDEMENT Left 12/30/2018   Procedure: DEBRIDEMENT WOUND WITH VAC PLACEMENT (LEFT UPPER EXTREMITY);  Surgeon: Renford Dills, MD;  Location: [facility] OR[facility];  Service: Vascular;  Laterality: Left;   Social History   Occupational History   Occupation: disability  Tobacco Use   Smoking status: Former    Packs/day: 0.25    Years: 6.00    Additional pack years: 0.00    Total pack years: 1.50    Types: Cigarettes    Quit date: 10/25/1980    Years since quitting: 42.0   Smokeless tobacco: Never  Vaping Use   Vaping Use: Never used  Substance and Sexual Activity   Alcohol use: Not Currently    Comment: rare   Drug use: Not Currently    Types: Marijuana   Sexual activity: Not on file    Comment: Not asked

## 2022-11-04 NOTE — Telephone Encounter (Signed)
Nyisha (RN) from [facility] called requesting verbal orders for wound care and frequency. Please call Nyisha on secure line at [phone]. If unable to answer leave detailed message.

## 2022-11-13 ENCOUNTER — Telehealth: Payer: Self-pay

## 2022-11-13 NOTE — Telephone Encounter (Signed)
Can she send a picture on MyChart?

## 2022-11-13 NOTE — Telephone Encounter (Signed)
Patient called stating that she is in a lot of pain and that her stitches are coming undone.  Patient had left foot 1st [place] amputation on 10/28/2022.  Cb# [phone].  Please advise.  Thank you

## 2022-11-13 NOTE — Telephone Encounter (Signed)
Called patient and she stated that she can't take the picture, but she will get her daughter to take the picture when she gets there and send it.

## 2022-11-14 ENCOUNTER — Encounter (HOSPITAL_COMMUNITY): Payer: Self-pay

## 2022-11-14 ENCOUNTER — Other Ambulatory Visit: Payer: Self-pay

## 2022-11-14 ENCOUNTER — Inpatient Hospital Stay (HOSPITAL_COMMUNITY)
Admit: 2022-11-14 | Discharge: 2022-11-17 | DRG: 638 | Disposition: A | Discharge status: Home Health | Payer: 59 | Attending: Internal Medicine | Admitting: Internal Medicine

## 2022-11-14 ENCOUNTER — Encounter (HOSPITAL_COMMUNITY): Payer: Self-pay | Admitting: Internal Medicine

## 2022-11-14 DIAGNOSIS — F32A Depression, unspecified: Secondary | ICD-10-CM | POA: Diagnosis present

## 2022-11-14 DIAGNOSIS — E1142 Type 2 diabetes mellitus with diabetic polyneuropathy: Secondary | ICD-10-CM | POA: Diagnosis present

## 2022-11-14 DIAGNOSIS — Z794 Long term (current) use of insulin: Secondary | ICD-10-CM

## 2022-11-14 DIAGNOSIS — L089 Local infection of the skin and subcutaneous tissue, unspecified: Secondary | ICD-10-CM | POA: Diagnosis present

## 2022-11-14 DIAGNOSIS — Z7985 Long-term (current) use of injectable non-insulin antidiabetic drugs: Secondary | ICD-10-CM

## 2022-11-14 DIAGNOSIS — Y838 Other surgical procedures as the cause of abnormal reaction of the patient, or of later complication, without mention of misadventure at the time of the procedure: Secondary | ICD-10-CM | POA: Diagnosis present

## 2022-11-14 DIAGNOSIS — I2510 Atherosclerotic heart disease of native coronary artery without angina pectoris: Secondary | ICD-10-CM | POA: Diagnosis present

## 2022-11-14 DIAGNOSIS — M60074 Infective myositis, left foot: Secondary | ICD-10-CM | POA: Diagnosis present

## 2022-11-14 DIAGNOSIS — I5042 Chronic combined systolic (congestive) and diastolic (congestive) heart failure: Secondary | ICD-10-CM | POA: Diagnosis present

## 2022-11-14 DIAGNOSIS — Z87891 Personal history of nicotine dependence: Secondary | ICD-10-CM

## 2022-11-14 DIAGNOSIS — I350 Nonrheumatic aortic (valve) stenosis: Secondary | ICD-10-CM | POA: Diagnosis present

## 2022-11-14 DIAGNOSIS — T8149XA Infection following a procedure, other surgical site, initial encounter: Secondary | ICD-10-CM | POA: Diagnosis present

## 2022-11-14 DIAGNOSIS — L02612 Cutaneous abscess of left foot: Secondary | ICD-10-CM | POA: Diagnosis present

## 2022-11-14 DIAGNOSIS — M79672 Pain in left foot: Secondary | ICD-10-CM | POA: Diagnosis present

## 2022-11-14 DIAGNOSIS — E1151 Type 2 diabetes mellitus with diabetic peripheral angiopathy without gangrene: Secondary | ICD-10-CM | POA: Diagnosis present

## 2022-11-14 DIAGNOSIS — N186 End stage renal disease: Secondary | ICD-10-CM | POA: Diagnosis present

## 2022-11-14 DIAGNOSIS — Z992 Dependence on renal dialysis: Secondary | ICD-10-CM | POA: Diagnosis not present

## 2022-11-14 DIAGNOSIS — J449 Chronic obstructive pulmonary disease, unspecified: Secondary | ICD-10-CM | POA: Diagnosis not present

## 2022-11-14 DIAGNOSIS — L89896 Pressure-induced deep tissue damage of other site: Secondary | ICD-10-CM | POA: Diagnosis present

## 2022-11-14 DIAGNOSIS — D631 Anemia in chronic kidney disease: Secondary | ICD-10-CM | POA: Diagnosis present

## 2022-11-14 DIAGNOSIS — D696 Thrombocytopenia, unspecified: Secondary | ICD-10-CM | POA: Diagnosis present

## 2022-11-14 DIAGNOSIS — Z833 Family history of diabetes mellitus: Secondary | ICD-10-CM

## 2022-11-14 DIAGNOSIS — M109 Gout, unspecified: Secondary | ICD-10-CM | POA: Diagnosis present

## 2022-11-14 DIAGNOSIS — E875 Hyperkalemia: Secondary | ICD-10-CM | POA: Diagnosis present

## 2022-11-14 DIAGNOSIS — E1122 Type 2 diabetes mellitus with diabetic chronic kidney disease: Secondary | ICD-10-CM | POA: Diagnosis present

## 2022-11-14 DIAGNOSIS — B182 Chronic viral hepatitis C: Secondary | ICD-10-CM | POA: Diagnosis present

## 2022-11-14 DIAGNOSIS — Z955 Presence of coronary angioplasty implant and graft: Secondary | ICD-10-CM

## 2022-11-14 DIAGNOSIS — E785 Hyperlipidemia, unspecified: Secondary | ICD-10-CM | POA: Diagnosis present

## 2022-11-14 DIAGNOSIS — Z800 Family history of malignant neoplasm of digestive organs: Secondary | ICD-10-CM

## 2022-11-14 DIAGNOSIS — Z6841 Body Mass Index (BMI) 40.0 and over, adult: Secondary | ICD-10-CM

## 2022-11-14 DIAGNOSIS — Z7982 Long term (current) use of aspirin: Secondary | ICD-10-CM

## 2022-11-14 DIAGNOSIS — Z885 Allergy status to narcotic agent status: Secondary | ICD-10-CM

## 2022-11-14 DIAGNOSIS — Z888 Allergy status to other drugs, medicaments and biological substances status: Secondary | ICD-10-CM

## 2022-11-14 DIAGNOSIS — E11628 Type 2 diabetes mellitus with other skin complications: Principal | ICD-10-CM

## 2022-11-14 DIAGNOSIS — I5032 Chronic diastolic (congestive) heart failure: Secondary | ICD-10-CM | POA: Diagnosis present

## 2022-11-14 DIAGNOSIS — Z79899 Other long term (current) drug therapy: Secondary | ICD-10-CM

## 2022-11-14 DIAGNOSIS — Z89422 Acquired absence of other left toe(s): Secondary | ICD-10-CM

## 2022-11-14 DIAGNOSIS — K219 Gastro-esophageal reflux disease without esophagitis: Secondary | ICD-10-CM | POA: Diagnosis present

## 2022-11-14 DIAGNOSIS — R1319 Other dysphagia: Secondary | ICD-10-CM | POA: Diagnosis present

## 2022-11-14 DIAGNOSIS — R9431 Abnormal electrocardiogram [ECG] [EKG]: Secondary | ICD-10-CM | POA: Diagnosis present

## 2022-11-14 DIAGNOSIS — I132 Hypertensive heart and chronic kidney disease with heart failure and with stage 5 chronic kidney disease, or end stage renal disease: Secondary | ICD-10-CM | POA: Diagnosis present

## 2022-11-14 DIAGNOSIS — Z8249 Family history of ischemic heart disease and other diseases of the circulatory system: Secondary | ICD-10-CM

## 2022-11-14 DIAGNOSIS — G4733 Obstructive sleep apnea (adult) (pediatric): Secondary | ICD-10-CM | POA: Diagnosis present

## 2022-11-14 DIAGNOSIS — I252 Old myocardial infarction: Secondary | ICD-10-CM

## 2022-11-14 DIAGNOSIS — Z91013 Allergy to seafood: Secondary | ICD-10-CM

## 2022-11-14 DIAGNOSIS — Z7951 Long term (current) use of inhaled steroids: Secondary | ICD-10-CM

## 2022-11-14 DIAGNOSIS — Z531 Procedure and treatment not carried out because of patient's decision for reasons of belief and group pressure: Secondary | ICD-10-CM | POA: Diagnosis present

## 2022-11-14 LAB — CBC
HCT: 37.6 % (ref 36.0–46.0)
Hemoglobin: 11.2 g/dL — ABNORMAL LOW (ref 12.0–15.0)
MCH: 27.6 pg (ref 26.0–34.0)
MCHC: 29.8 g/dL — ABNORMAL LOW (ref 30.0–36.0)
MCV: 92.6 fL (ref 80.0–100.0)
Platelets: 154 10*3/uL (ref 150–400)
RBC: 4.06 MIL/uL (ref 3.87–5.11)
RDW: 15.9 % — ABNORMAL HIGH (ref 11.5–15.5)
WBC: 6 10*3/uL (ref 4.0–10.5)
nRBC: 0 % (ref 0.0–0.2)

## 2022-11-14 LAB — MAGNESIUM: Magnesium: 2 mg/dL (ref 1.7–2.4)

## 2022-11-14 LAB — CREATININE, SERUM
Creatinine, Ser: 8.48 mg/dL — ABNORMAL HIGH (ref 0.44–1.00)
GFR, Estimated: 5 mL/min — ABNORMAL LOW (ref >60)

## 2022-11-14 LAB — LACTIC ACID, PLASMA: Lactic Acid, Venous: 2.2 mmol/L (ref 0.5–1.9)

## 2022-11-14 LAB — GLUCOSE, CAPILLARY: Glucose-Capillary: 261 mg/dL — ABNORMAL HIGH (ref 70–99)

## 2022-11-14 MED ORDER — METRONIDAZOLE 500 MG/100ML IV SOLN
500.0000 mg | Freq: Two times a day (BID) | INTRAVENOUS | Status: DC
  Administered 2022-11-14: 500 mg via INTRAVENOUS
  Filled 2022-11-14: qty 100

## 2022-11-14 MED ORDER — VANCOMYCIN HCL 750 MG/150ML IV SOLN
750.0000 mg | INTRAVENOUS | Status: DC
  Filled 2022-11-14: qty 150

## 2022-11-14 MED ORDER — ACETAMINOPHEN 325 MG PO TABS: 650.0000 mg | ORAL_TABLET | Freq: Four times a day (QID) | ORAL | Status: DC | PRN

## 2022-11-14 MED ORDER — HEPARIN SODIUM (PORCINE) 5000 UNIT/ML IJ SOLN
5000.0000 [IU] | Freq: Three times a day (TID) | INTRAMUSCULAR | Status: DC
  Administered 2022-11-14 – 2022-11-17 (×9): 5000 [IU] via SUBCUTANEOUS
  Filled 2022-11-14 (×9): qty 1

## 2022-11-14 MED ORDER — PIPERACILLIN-TAZOBACTAM IN DEX 2-0.25 GM/50ML IV SOLN
2.2500 g | Freq: Three times a day (TID) | INTRAVENOUS | Status: DC
  Administered 2022-11-15 – 2022-11-17 (×9): 2.25 g via INTRAVENOUS
  Filled 2022-11-14 (×13): qty 50

## 2022-11-14 MED ORDER — INSULIN ASPART 100 UNIT/ML IJ SOLN
0.0000 [IU] | Freq: Three times a day (TID) | INTRAMUSCULAR | Status: DC
  Administered 2022-11-15: 5 [IU] via SUBCUTANEOUS
  Administered 2022-11-15 (×2): 3 [IU] via SUBCUTANEOUS
  Administered 2022-11-16: 2 [IU] via SUBCUTANEOUS
  Administered 2022-11-17: 5 [IU] via SUBCUTANEOUS

## 2022-11-14 MED ORDER — INSULIN GLARGINE-YFGN 100 UNIT/ML ~~LOC~~ SOLN
15.0000 [IU] | Freq: Every day | SUBCUTANEOUS | Status: DC
  Administered 2022-11-15 – 2022-11-16 (×3): 15 [IU] via SUBCUTANEOUS
  Filled 2022-11-14 (×5): qty 0.15

## 2022-11-14 MED ORDER — MIDODRINE HCL 5 MG PO TABS
10.0000 mg | ORAL_TABLET | ORAL | Status: DC
  Administered 2022-11-17 (×2): 10 mg via ORAL
  Filled 2022-11-14 (×3): qty 2

## 2022-11-14 MED ORDER — ACETAMINOPHEN 650 MG RE SUPP: 650.0000 mg | Freq: Four times a day (QID) | RECTAL | Status: DC | PRN

## 2022-11-14 MED ORDER — HYDROMORPHONE HCL 1 MG/ML IJ SOLN
1.0000 mg | INTRAMUSCULAR | Status: DC | PRN
  Administered 2022-11-14: 1 mg via INTRAVENOUS
  Filled 2022-11-14: qty 1

## 2022-11-14 MED ORDER — VANCOMYCIN HCL 2000 MG/400ML IV SOLN
2000.0000 mg | Freq: Once | INTRAVENOUS | Status: DC
  Filled 2022-11-14: qty 400

## 2022-11-14 NOTE — Progress Notes (Signed)
Pharmacy Antibiotic Note  [name] is a 67 y.o. female admitted on 11/14/2022 with L foot osteomyelitis s/p amputation, now with possible wound infection.  Pharmacy has been consulted for Vancomycin and Zosyn  dosing.  Plan: D/C Flagyl Vancomycin 2000 mg IV now, then Vancomycin 750 mg IV after each HD Zosyn 2.25 g IV q8h     Temp (24hrs), Avg:97.9 F (36.6 C), Min:97.9 F (36.6 C), Max:97.9 F (36.6 C)  No results for input(s): "WBC", "CREATININE", "LATICACIDVEN", "VANCOTROUGH", "VANCOPEAK", "VANCORANDOM", "GENTTROUGH", "GENTPEAK", "GENTRANDOM", "TOBRATROUGH", "TOBRAPEAK", "TOBRARND", "AMIKACINPEAK", "AMIKACINTROU", "AMIKACIN" in the last 168 hours.  Estimated Creatinine Clearance: 14.8 mL/min (A) (by C-G formula based on SCr of 4.74 mg/dL (H)).    Allergies  Allergen Reactions   Shellfish Allergy Anaphylaxis and Swelling   Diazepam Other (See Comments)    "felt like out of body experience"   Morphine Itching and Other (See Comments)    Tolerated hydromorphone on 07/2020 *will take along with Benadryl*     Eddie Candle 11/14/2022 11:24 PM

## 2022-11-14 NOTE — H&P (Signed)
PCP:   Felix Pacini, FNP   Chief Complaint:  Left foot pain  HPI: This is a 67 year old female w/ PMHx of IDDM, ESRD on HD TTS, HLD, CAD, HTN, GERD, Hep C, and depression.  Patient recently admitted 4/9 - 4/21.  She was admitted with osteomyelitis of left foot, she had amputation of the first [place] 4/17.  She additionally was diagnosed with pending C. difficile pancolitis, discharged on vancomycin p.o.  She her last dose was today.  Her diarrhea has resolved.  Her MRSA screen was positive for/10/24. On Thursday she went to hemodialysis, her left foot was wrapped.  She developed pain and remove the bandages.  The wound care nurse was scheduled to visit that afternoon, and examined the foot, she noted the stitches were loosening.  The foot was still painful, more swollen and she had some chills.  She denies fevers.    She went to the ER in [place]. CRP elevated at 5.2.  Blood cultures not collected.  MRI foot done at [facility] shows 1.  Previous amputation of the 1st metatarsal and toe.  2.  4 cm fluid and gas collection consistent with abscess extending along the surgical bed from the distal aspect of the medial cuneiform into the forefoot.  3.  Previous fracture of the base of the 2nd proximal phalanx with medial subluxation.  4.  Marrow edema of the 2nd proximal phalanx and 2nd metatarsal head with joint changes. Although these changes could all be posttraumatic in nature, the amount of edema and soft tissue changes raises intermediate suspicion for early osteomyelitis and possible early septic arthritis.  5.  Probable infectious/inflammatory myositis of the intraosseous and adductor muscles of the medial forefoot.  6.  Nonspecific subcutaneous edema of the dorsum of the foot which is likely mostly diabetic, neuropathic or vascular in nature with possible mild superimposed infectious cellulitis of the dorsomedial forefoot   Dr. Lajoyce Corners was contacted.  He recommended admission at  [facility].  Patient was transferred.  Review of Systems:  Per HPI  Past Medical History: Past Medical History:  Diagnosis Date   Anemia    Aortic stenosis    Echo 8/18: mean 13, peak 28, LVOT/AV mean velocity 0.51   Arthritis    Asthma    As a child    Bronchitis    CAD (coronary artery disease)    a. 09/2016: 50% Ost 1st Mrg stenosis, 50% 2nd Mrg stenosis, 20% Mid-Cx, 95% Prox LAD, 40% mid-LAD, and 10% dist-LAD stenosis. Staged PCI with DES to Prox-LAD.    Chronic combined systolic and diastolic CHF (congestive heart failure) (HCC) 2011   echo 2/18: EF 55-60, normal wall motion, grade 2 diastolic dysfunction, trivial AI // echo 3/18: Septal and apical HK, EF 45-50, normal wall motion, trivial AI, mild LAE, PASP 38 // echo 8/18: EF 60-65, normal wall motion, grade 1 diastolic dysfunction, calcified aortic valve leaflets, mild aortic stenosis (mean 13, peak 28, LVOT/AV mean velocity 0.51), mild AI, moderate MAC, mild LAE, trivial TR    Chronic kidney disease    STAGE 4   Chronic kidney disease on chronic dialysis (HCC)    t, th, sat   Complication of anesthesia    Depression    Diabetes mellitus Dx 1989   Elevated lipids    GERD (gastroesophageal reflux disease)    Gout    Heart murmur    asymptomatic   Hepatitis C Dx 2013   Hypertension Dx 1989   Infected surgical wound  Lt arm   Myocardial infarction (HCC) 07/2015   Obesity    Pancreatitis 2013   Pneumonia    Refusal of blood transfusions as patient is Jehovah's Witness    pt states she is not [place] witness and does not refuse blood products   Tendinitis    Tremors of nervous system    LEFT HAND   Ulcer 2010   Past Surgical History:  Procedure Laterality Date   A/V FISTULAGRAM Left 04/11/2019   Procedure: A/V FISTULAGRAM;  Surgeon: Renford Dills, MD;  Location: [facility] INVASIVE CV LAB;  Service: Cardiovascular;  Laterality: Left;   A/V FISTULAGRAM Left 06/02/2019   Procedure: A/V FISTULAGRAM;  Surgeon:  Renford Dills, MD;  Location: [facility] INVASIVE CV LAB;  Service: Cardiovascular;  Laterality: Left;   AMPUTATION Left 10/28/2022   Procedure: LEFT FOOT 1ST [place] AMPUTATION;  Surgeon: Nadara Mustard, MD;  Location: [facility] OR;  Service: Orthopedics;  Laterality: Left;   APPLICATION OF WOUND VAC Left 08/14/2017   Procedure: APPLICATION OF WOUND VAC Exchange;  Surgeon: Earline Mayotte, MD;  Location: [facility] ORS;  Service: General;  Laterality: Left;   APPLICATION OF WOUND VAC Left 12/21/2018   Procedure: APPLICATION OF WOUND VAC;  Surgeon: Renford Dills, MD;  Location: [facility] ORS;  Service: Vascular;  Laterality: Left;   AV FISTULA PLACEMENT Left 08/19/2018   Procedure: ARTERIOVENOUS (AV) FISTULA CREATION ( BRACHIOBASILIC );  Surgeon: Renford Dills, MD;  Location: [facility] ORS;  Service: Vascular;  Laterality: Left;   BASCILIC VEIN TRANSPOSITION Left 11/18/2018   Procedure: BASCILIC VEIN TRANSPOSITION;  Surgeon: Renford Dills, MD;  Location: [facility] ORS;  Service: Vascular;  Laterality: Left;   BIOPSY  09/20/2020   Procedure: BIOPSY;  Surgeon: Jeani Hawking, MD;  Location: [facility] ENDOSCOPY;  Service: Endoscopy;;   CHOLECYSTECTOMY     COLONOSCOPY WITH PROPOFOL N/A 02/03/2018   Procedure: COLONOSCOPY WITH PROPOFOL;  Surgeon: Toney Reil, MD;  Location: [facility] ENDOSCOPY;  Service: Gastroenterology;  Laterality: N/A;   COLONOSCOPY WITH PROPOFOL N/A 07/25/2022   Procedure: COLONOSCOPY WITH PROPOFOL;  Surgeon: Regis Bill, MD;  Location: [facility] ENDOSCOPY;  Service: Endoscopy;  Laterality: N/A;   CORONARY ANGIOPLASTY  07/2015   STENT   CORONARY STENT INTERVENTION N/A 09/18/2016   Procedure: Coronary Stent Intervention;  Surgeon: Lennette Bihari, MD;  Location: [facility] CV LAB;  Service: Cardiovascular;  Laterality: N/A;   DIALYSIS/PERMA CATHETER INSERTION N/A 05/10/2018   Procedure: DIALYSIS/PERMA CATHETER INSERTION;  Surgeon: Renford Dills, MD;  Location: [facility];  Service:  Cardiovascular;  Laterality: N/A;   DRESSING CHANGE UNDER ANESTHESIA Left 08/15/2017   Procedure: exploration of wound for bleeding;  Surgeon: Earline Mayotte, MD;  Location: [facility] ORS;  Service: General;  Laterality: Left;   ESOPHAGOGASTRODUODENOSCOPY (EGD) WITH PROPOFOL N/A 02/03/2018   Procedure: ESOPHAGOGASTRODUODENOSCOPY (EGD) WITH PROPOFOL;  Surgeon: Toney Reil, MD;  Location: [facility] ENDOSCOPY;  Service: Gastroenterology;  Laterality: N/A;   ESOPHAGOGASTRODUODENOSCOPY (EGD) WITH PROPOFOL N/A 09/20/2020   Procedure: ESOPHAGOGASTRODUODENOSCOPY (EGD) WITH PROPOFOL;  Surgeon: Jeani Hawking, MD;  Location: [facility] ENDOSCOPY;  Service: Endoscopy;  Laterality: N/A;   ESOPHAGOGASTRODUODENOSCOPY (EGD) WITH PROPOFOL N/A 07/25/2022   Procedure: ESOPHAGOGASTRODUODENOSCOPY (EGD) WITH PROPOFOL;  Surgeon: Regis Bill, MD;  Location: [facility] ENDOSCOPY;  Service: Endoscopy;  Laterality: N/A;   EYE SURGERY  11/17/2018   INCISION AND DRAINAGE ABSCESS Left 08/12/2017   Procedure: INCISION AND DRAINAGE ABSCESS;  Surgeon: Earline Mayotte, MD;  Location: [facility] ORS;  Service: General;  Laterality: Left;   KNEE ARTHROSCOPY     LEFT HEART CATH N/A 09/18/2016   Procedure: Left Heart Cath;  Surgeon: Lennette Bihari, MD;  Location: [facility] INVASIVE CV LAB;  Service: Cardiovascular;  Laterality: N/A;   LEFT HEART CATH AND CORONARY ANGIOGRAPHY N/A 09/16/2016   Procedure: Left Heart Cath and Coronary Angiography;  Surgeon: Kathleene Hazel, MD;  Location: [facility] INVASIVE CV LAB;  Service: Cardiovascular;  Laterality: N/A;   LEFT HEART CATH AND CORONARY ANGIOGRAPHY N/A 04/29/2017   Procedure: LEFT HEART CATH AND CORONARY ANGIOGRAPHY;  Surgeon: Yvonne Kendall, MD;  Location: [facility] CV [facility];  Service: Cardiovascular;  Laterality: N/A;   LOWER EXTREMITY ANGIOGRAPHY Right 03/08/2018   Procedure: LOWER EXTREMITY ANGIOGRAPHY;  Surgeon: Renford Dills, MD;  Location: [facility];  Service: Cardiovascular;   Laterality: Right;   TUBAL LIGATION     TUBAL LIGATION     UPPER EXTREMITY ANGIOGRAPHY Right 09/19/2019   Procedure: UPPER EXTREMITY ANGIOGRAPHY;  Surgeon: Renford Dills, MD;  Location: [facility] INV[facility];  Service: Cardiovascular;  Laterality: Right;   WOUND DEBRIDEMENT Left 12/21/2018   Procedure: DEBRIDEMENT WOUND;  Surgeon: Renford Dills, MD;  Location: [facility] ORS;  Service: Vascular;  Laterality: Left;   WOUND DEBRIDEMENT Left 12/30/2018   Procedure: DEBRIDEMENT WOUND WITH VAC PLACEMENT (LEFT UPPER EXTREMITY);  Surgeon: Renford Dills, MD;  Location: [facility] ORS;  Service: Vascular;  Laterality: Left;    Medications: Prior to Admission medications   Medication Sig Start Date End Date Taking? Authorizing Provider  albuterol (VENTOLIN HFA) 108 (90 Base) MCG/ACT inhaler Inhale 2 puffs into the lungs every 4 (four) hours as needed for wheezing or shortness of breath. 06/28/20   Margaretann Loveless, PA-C  aspirin 81 MG EC tablet Take 1 tablet (81 mg total) by mouth daily. 09/20/20   Leroy Sea, MD  atorvastatin (LIPITOR) 80 MG tablet Take 1 tablet (80 mg total) by mouth every evening. Patient taking differently: Take 80 mg by mouth daily. 04/24/21   Erasmo Downer, MD  carvedilol (COREG) 12.5 MG tablet Take 1 tablet (12.5 mg total) by mouth 2 (two) times daily with a meal. 09/19/21   Paige, Lucas Mallow, DO  Continuous Blood Gluc Receiver (FREESTYLE LIBRE 14 DAY READER) DEVI To check blood sugar ACHS 09/27/20   Margaretann Loveless, PA-C  Continuous Blood Gluc Sensor (FREESTYLE LIBRE 14 DAY SENSOR) MISC To check blood sugar ACHS; change every 14 days Patient not taking: Reported on 10/23/2022 09/27/20   Margaretann Loveless, PA-C  fluticasone [facility]) 50 MCG/ACT nasal spray Place 2 sprays into both nostrils daily as needed for allergies or rhinitis. Patient not taking: Reported on 10/23/2022 09/28/17   Margaretann Loveless, PA-C  fluticasone furoate-vilanterol (BREO ELLIPTA)  200-25 MCG/INH AEPB Inhale 1 puff into the lungs daily. 08/30/20   Margaretann Loveless, PA-C  Glucose Blood (BLOOD GLUCOSE TEST STRIPS 333) STRP Enough test stripes to test blood sugar TID x 30 days. No refills 04/16/22   Charise Killian, MD  HUMULIN 70/30 KWIKPEN (70-30) 100 UNIT/ML KwikPen Inject 30 Units into the skin 2 (two) times daily. 05/26/22   [provider]  hydrOXYzine (ATARAX/VISTARIL) 25 MG tablet Take 1 tablet (25 mg total) by mouth 4 (four) times daily. Patient taking differently: Take 25 mg by mouth 2 (two) times daily. 12/03/20   Erasmo Downer, MD  Insulin Pen Needle 32G X 4 MM MISC Enough pen needles for  100 units of novolin QAM x 30 days. No refills 04/16/22   Charise Killian, MD  LEVEMIR FLEXPEN 100 UNIT/ML FlexPen Inject 25 Units into the skin daily. 11/02/22   Nadara Mustard, MD  methocarbamol (ROBAXIN) 500 MG tablet Take 1 tablet (500 mg total) by mouth 2 (two) times daily. 11/02/22   Nadara Mustard, MD  midodrine (PROAMATINE) 10 MG tablet Take 10 mg by mouth See admin instructions. Take 10 mg by mouth 30 minutes before dialysis on Tues/Thurs/Sat 12/23/21   [provider]  naloxone [facility]) nasal spray 4 mg/0.1 mL Place 1 spray into the nose as directed. 04/08/22   [provider]  nitroGLYCERIN (NITROSTAT) 0.4 MG SL tablet Place 1 tablet (0.4 mg total) under the tongue every 5 (five) minutes x 3 doses as needed for chest pain. 05/03/20   Margaretann Loveless, PA-C  Oxycodone HCl 10 MG TABS Take 10 mg by mouth 4 (four) times daily. 04/01/22   [provider]  pantoprazole (PROTONIX) 40 MG tablet Take 40 mg by mouth daily before breakfast.    [provider]  pregabalin (LYRICA) 100 MG capsule Take 1 capsule (100 mg total) by mouth 2 (two) times daily. 11/02/22   Nadara Mustard, MD  torsemide (DEMADEX) 10 MG tablet Take 10 mg by mouth See admin instructions. Take 10 mg by mouth every other morning    [provider]   valACYclovir (VALTREX) 500 MG tablet TAKE (1) TABLET BY MOUTH EVERY OTHER DAY. Patient taking differently: Take 500 mg by mouth daily. 01/24/21   Bacigalupo, Marzella Schlein, MD  vancomycin (VANCOCIN) 125 MG capsule Take 1 capsule (125 mg total) by mouth 4 (four) times daily for 12 days. 11/02/22 11/14/22  Nadara Mustard, MD  VELPHORO 500 MG chewable tablet Chew 1,500 mg by mouth 3 (three) times daily. 09/11/21   [provider]  gabapentin (NEURONTIN) 100 MG capsule Take 1 capsule (100 mg total) by mouth 3 (three) times daily. 08/19/17 02/03/19  Enedina Finner, MD    Allergies:   Allergies  Allergen Reactions   Shellfish Allergy Anaphylaxis and Swelling   Diazepam Other (See Comments)    "felt like out of body experience"   Morphine Itching and Other (See Comments)    Tolerated hydromorphone on 07/2020 *will take along with Benadryl*    Social History:  reports that she quit smoking about 42 years ago. Her smoking use included cigarettes. She has a 1.50 pack-year smoking history. She has never used smokeless tobacco. She reports that she does not currently use alcohol. She reports that she does not currently use drugs after having used the following drugs: Marijuana.  Family History: Family History  Problem Relation Age of Onset   Colon cancer Mother    Heart attack Other    Heart attack Maternal Grandmother    Hypertension Sister    Hypertension Brother    Diabetes Paternal Grandmother    Breast cancer Neg Hx     Physical Exam: Vitals:   11/14/22 2213  BP: (!) 136/55  Pulse: 69  Resp: 19  Temp: 97.9 F (36.6 C)  TempSrc: Oral  SpO2: 99%    General:  Alert and oriented times three, well developed and nourished, uncomfortable d/t pain Eyes: Pink conjunctiva, no scleral icterus ENT: Moist oral mucosa, neck supple, no thyromegaly Lungs: clear to ascultation, no wheeze, no crackles, no use of accessory muscles Cardiovascular: regular rate and rhythm, no regurgitation, no  gallops, no murmurs. No  carotid bruits, no JVD Abdomen: soft, positive BS, non-tender, non-distended, no organomegaly, not an acute abdomen GU: not examined Neuro: CN II - XII grossly intact, Musculoskeletal: strength 5/5 all extremities. LLE incision site mild dihisence. Some fluctuance on planter surface beneath great toe.  Left foot is swollen Skin: no rash, no subcutaneous crepitation, no decubitus Psych: appropriate patient   Radiological Exams on Admission: No results found.  Assessment/Plan Present on Admission:  Diabetic foot infection with abscess (HCC)/status post first [place] amputation -Blood cultures collected. -Continue vancomycin, cefepime and Flagyl -Dilaudid and Roxicodone ordered as needed -Dr. Lajoyce Corners contacted via epic chat   Recent C. difficile pancolitis infection -Patient completed p.o. Vanco today.  Will continue p.o. vancomycin as she has been restarted on IV antibiotics. -Probiotics added   ESRD (end stage renal disease) (HCC) -Nephrologist on-call contacted via epic chat   CAD (coronary artery disease)  Chronic diastolic heart failure (HCC) -Aspirin on hold, Lipitor, Coreg, torsemide resumed   COPD (chronic obstructive pulmonary disease) (HCC) -.  Nebulizers   Diabetes mellitus type 2 -Sliding scale insulin initiated -Patient's 70/30 30units [place] BID started -Patient's Levemir 25 units subcu daily, ordered at a decreased dose of 12 units subcu.  QHS.  Decrease dosage ordered in anticipation of possible OR for washout -N.p.o.  Chronic hepatitis C without hepatic coma (HCC)   Anjalina Bergevin 11/14/2022, 10:39 PM

## 2022-11-15 DIAGNOSIS — E875 Hyperkalemia: Secondary | ICD-10-CM | POA: Diagnosis not present

## 2022-11-15 DIAGNOSIS — E11628 Type 2 diabetes mellitus with other skin complications: Secondary | ICD-10-CM | POA: Diagnosis not present

## 2022-11-15 DIAGNOSIS — L089 Local infection of the skin and subcutaneous tissue, unspecified: Secondary | ICD-10-CM | POA: Diagnosis not present

## 2022-11-15 LAB — GLUCOSE, CAPILLARY
Glucose-Capillary: 242 mg/dL — ABNORMAL HIGH (ref 70–99)
Glucose-Capillary: 228 mg/dL — ABNORMAL HIGH (ref 70–99)
Glucose-Capillary: 181 mg/dL — ABNORMAL HIGH (ref 70–99)
Glucose-Capillary: 179 mg/dL — ABNORMAL HIGH (ref 70–99)
Glucose-Capillary: 182 mg/dL — ABNORMAL HIGH (ref 70–99)

## 2022-11-15 LAB — COMPREHENSIVE METABOLIC PANEL
ALT: 53 U/L — ABNORMAL HIGH (ref 0–44)
AST: 49 U/L — ABNORMAL HIGH (ref 15–41)
Albumin: 2.9 g/dL — ABNORMAL LOW (ref 3.5–5.0)
Alkaline Phosphatase: 348 U/L — ABNORMAL HIGH (ref 38–126)
Anion gap: 14 (ref 5–15)
BUN: 48 mg/dL — ABNORMAL HIGH (ref 8–23)
CO2: 26 mmol/L (ref 22–32)
Calcium: 9 mg/dL (ref 8.9–10.3)
Chloride: 92 mmol/L — ABNORMAL LOW (ref 98–111)
Creatinine, Ser: 9.26 mg/dL — ABNORMAL HIGH (ref 0.44–1.00)
GFR, Estimated: 4 mL/min — ABNORMAL LOW (ref >60)
Glucose, Bld: 221 mg/dL — ABNORMAL HIGH (ref 70–99)
Potassium: 5.8 mmol/L — ABNORMAL HIGH (ref 3.5–5.1)
Sodium: 132 mmol/L — ABNORMAL LOW (ref 135–145)
Total Bilirubin: 0.5 mg/dL (ref 0.3–1.2)
Total Protein: 6.7 g/dL (ref 6.5–8.1)

## 2022-11-15 LAB — CBC WITH DIFFERENTIAL/PLATELET
Abs Immature Granulocytes: 0.03 10*3/uL (ref 0.00–0.07)
Basophils Absolute: 0 10*3/uL (ref 0.0–0.1)
Basophils Relative: 0 %
Eosinophils Absolute: 0.5 10*3/uL (ref 0.0–0.5)
Eosinophils Relative: 8 %
HCT: 33.8 % — ABNORMAL LOW (ref 36.0–46.0)
Hemoglobin: 10.6 g/dL — ABNORMAL LOW (ref 12.0–15.0)
Immature Granulocytes: 1 %
Lymphocytes Relative: 17 %
Lymphs Abs: 1.1 10*3/uL (ref 0.7–4.0)
MCH: 28.2 pg (ref 26.0–34.0)
MCHC: 31.4 g/dL (ref 30.0–36.0)
MCV: 89.9 fL (ref 80.0–100.0)
Monocytes Absolute: 0.4 10*3/uL (ref 0.1–1.0)
Monocytes Relative: 7 %
Neutro Abs: 4.3 10*3/uL (ref 1.7–7.7)
Neutrophils Relative %: 67 %
Platelets: 146 10*3/uL — ABNORMAL LOW (ref 150–400)
RBC: 3.76 MIL/uL — ABNORMAL LOW (ref 3.87–5.11)
RDW: 15.9 % — ABNORMAL HIGH (ref 11.5–15.5)
WBC: 6.4 10*3/uL (ref 4.0–10.5)
nRBC: 0 % (ref 0.0–0.2)

## 2022-11-15 LAB — HEPATITIS B SURFACE ANTIGEN: Hepatitis B Surface Ag: NONREACTIVE

## 2022-11-15 LAB — LACTIC ACID, PLASMA: Lactic Acid, Venous: 1.6 mmol/L (ref 0.5–1.9)

## 2022-11-15 MED ORDER — CHLORHEXIDINE GLUCONATE CLOTH 2 % EX PADS
6.0000 | MEDICATED_PAD | Freq: Every day | CUTANEOUS | Status: DC
  Administered 2022-11-16 – 2022-11-17 (×2): 6 via TOPICAL

## 2022-11-15 MED ORDER — ASPIRIN 81 MG PO TBEC
81.0000 mg | DELAYED_RELEASE_TABLET | Freq: Every day | ORAL | Status: DC
  Administered 2022-11-15 – 2022-11-17 (×2): 81 mg via ORAL
  Filled 2022-11-15 (×2): qty 1

## 2022-11-15 MED ORDER — SODIUM ZIRCONIUM CYCLOSILICATE 10 G PO PACK
10.0000 g | PACK | Freq: Two times a day (BID) | ORAL | Status: AC
  Administered 2022-11-15 – 2022-11-16 (×2): 10 g via ORAL
  Filled 2022-11-15 (×2): qty 1

## 2022-11-15 MED ORDER — VANCOMYCIN HCL 125 MG PO CAPS
125.0000 mg | ORAL_CAPSULE | Freq: Four times a day (QID) | ORAL | Status: DC
  Administered 2022-11-15 – 2022-11-17 (×7): 125 mg via ORAL
  Filled 2022-11-15 (×13): qty 1

## 2022-11-15 MED ORDER — HYDROMORPHONE HCL 1 MG/ML IJ SOLN
2.0000 mg | INTRAMUSCULAR | Status: DC | PRN
  Administered 2022-11-15 – 2022-11-17 (×6): 2 mg via INTRAVENOUS
  Filled 2022-11-15 (×7): qty 2

## 2022-11-15 MED ORDER — CARVEDILOL 12.5 MG PO TABS
12.5000 mg | ORAL_TABLET | Freq: Two times a day (BID) | ORAL | Status: DC
  Administered 2022-11-15 – 2022-11-17 (×3): 12.5 mg via ORAL
  Filled 2022-11-15 (×5): qty 1

## 2022-11-15 MED ORDER — TORSEMIDE 20 MG PO TABS
10.0000 mg | ORAL_TABLET | ORAL | Status: DC
  Administered 2022-11-17: 10 mg via ORAL
  Filled 2022-11-15: qty 1

## 2022-11-15 MED ORDER — VALACYCLOVIR HCL 500 MG PO TABS
500.0000 mg | ORAL_TABLET | ORAL | Status: DC
  Administered 2022-11-15 – 2022-11-17 (×2): 500 mg via ORAL
  Filled 2022-11-15 (×3): qty 1

## 2022-11-15 MED ORDER — HYDROMORPHONE HCL 1 MG/ML IJ SOLN
1.0000 mg | INTRAMUSCULAR | Status: DC | PRN
  Administered 2022-11-15: 1 mg via INTRAVENOUS
  Filled 2022-11-15: qty 1

## 2022-11-15 MED ORDER — PANTOPRAZOLE SODIUM 40 MG PO TBEC
40.0000 mg | DELAYED_RELEASE_TABLET | Freq: Every day | ORAL | Status: DC
  Administered 2022-11-17: 40 mg via ORAL
  Filled 2022-11-15: qty 1

## 2022-11-15 MED ORDER — OXYCODONE HCL 5 MG PO TABS
10.0000 mg | ORAL_TABLET | Freq: Four times a day (QID) | ORAL | Status: DC
  Administered 2022-11-15 – 2022-11-16 (×3): 10 mg via ORAL
  Filled 2022-11-15 (×3): qty 2
  Filled 2022-11-15 (×3): qty 1

## 2022-11-15 MED ORDER — HYDROXYZINE HCL 25 MG PO TABS
25.0000 mg | ORAL_TABLET | Freq: Two times a day (BID) | ORAL | Status: DC
  Administered 2022-11-15 – 2022-11-16 (×3): 25 mg via ORAL
  Filled 2022-11-15 (×3): qty 1

## 2022-11-15 MED ORDER — PREGABALIN 100 MG PO CAPS
100.0000 mg | ORAL_CAPSULE | Freq: Two times a day (BID) | ORAL | Status: DC
  Administered 2022-11-15 – 2022-11-16 (×3): 100 mg via ORAL
  Filled 2022-11-15 (×3): qty 1

## 2022-11-15 MED ORDER — NITROGLYCERIN 0.4 MG SL SUBL: 0.4000 mg | SUBLINGUAL_TABLET | SUBLINGUAL | Status: DC | PRN

## 2022-11-15 MED ORDER — ATORVASTATIN CALCIUM 80 MG PO TABS
80.0000 mg | ORAL_TABLET | Freq: Every evening | ORAL | Status: DC
  Administered 2022-11-15 – 2022-11-17 (×3): 80 mg via ORAL
  Filled 2022-11-15 (×3): qty 1

## 2022-11-15 MED ORDER — INSULIN ASPART PROT & ASPART (70-30 MIX) 100 UNIT/ML ~~LOC~~ SUSP
30.0000 [IU] | Freq: Two times a day (BID) | SUBCUTANEOUS | Status: DC
  Administered 2022-11-15 – 2022-11-17 (×4): 30 [IU] via SUBCUTANEOUS
  Filled 2022-11-15 (×2): qty 10

## 2022-11-15 MED ORDER — FLUTICASONE PROPIONATE 50 MCG/ACT NA SUSP: 2.0000 | Freq: Every day | NASAL | Status: DC | PRN

## 2022-11-15 MED ORDER — HYDROMORPHONE HCL 1 MG/ML IJ SOLN: 2.0000 mg | INTRAMUSCULAR | Status: DC | PRN

## 2022-11-15 MED ORDER — SUCROFERRIC OXYHYDROXIDE 500 MG PO CHEW
1500.0000 mg | CHEWABLE_TABLET | Freq: Three times a day (TID) | ORAL | Status: DC
  Administered 2022-11-15 – 2022-11-17 (×5): 1500 mg via ORAL
  Filled 2022-11-15 (×9): qty 3

## 2022-11-15 MED ORDER — OXYCODONE HCL 5 MG PO TABS: 5.0000 mg | ORAL_TABLET | ORAL | Status: DC | PRN

## 2022-11-15 MED ORDER — FLUTICASONE FUROATE-VILANTEROL 200-25 MCG/ACT IN AEPB
1.0000 | INHALATION_SPRAY | Freq: Every day | RESPIRATORY_TRACT | Status: DC
  Administered 2022-11-15 – 2022-11-17 (×3): 1 via RESPIRATORY_TRACT
  Filled 2022-11-15: qty 28

## 2022-11-15 MED ORDER — SACCHAROMYCES BOULARDII 250 MG PO CAPS
250.0000 mg | ORAL_CAPSULE | Freq: Two times a day (BID) | ORAL | Status: DC
  Administered 2022-11-15 – 2022-11-16 (×3): 250 mg via ORAL
  Filled 2022-11-15 (×3): qty 1

## 2022-11-15 MED ORDER — NITROGLYCERIN 0.2 MG/HR TD PT24
0.2000 mg | MEDICATED_PATCH | Freq: Every day | TRANSDERMAL | Status: DC
  Administered 2022-11-15 – 2022-11-17 (×3): 0.2 mg via TRANSDERMAL
  Filled 2022-11-15 (×3): qty 1

## 2022-11-15 MED ORDER — INSULIN DETEMIR 100 UNIT/ML FLEXPEN: 30.0000 [IU] | PEN_INJECTOR | Freq: Two times a day (BID) | SUBCUTANEOUS | Status: DC

## 2022-11-15 MED ORDER — METHOCARBAMOL 500 MG PO TABS
500.0000 mg | ORAL_TABLET | Freq: Two times a day (BID) | ORAL | Status: DC
  Administered 2022-11-15 – 2022-11-16 (×3): 500 mg via ORAL
  Filled 2022-11-15 (×3): qty 1

## 2022-11-15 NOTE — Consult Note (Addendum)
Renal Service Consult Note [facility]  [name] 11/15/2022 [name], MD Requesting Physician: Dr. Waymon Amato  Reason for Consult: ESRD pt w/ foot infection HPI: The patient is a 67 y.o. year-old w/ PMH as below who presented to ED w/ recent admit in April for osteo of the L foot and underwent toe amp. Also had Cdif diagnosis. The last few days she has been experiencing more and more pain and swelling in the L foot. The visiting nurse notes the stitches were loosening. She was sent to the ED in OSH. MRI there showed gas collection/ abscess extending along the surgical bed, also infectious myositis of adductors and intraosseous muscles. Ortho was consulted and pt admitted. IV abx were started. We are asked to see for dialysis.   Pt seen in room. Last HD was Thursday, she takes dialysis in [place], [place]. She used to live in [place] for many years, but now she lives and dialyzes in [place]. She comes to [place] "when I am sick" because her doctors and her daughter are here.    ROS - denies CP, no joint pain, no HA, no blurry vision, no rash, no diarrhea, no nausea/ vomiting, no dysuria, no difficulty voiding   Past Medical History  Past Medical History:  Diagnosis Date   Anemia    Aortic stenosis    Echo 8/18: mean 13, peak 28, LVOT/AV mean velocity 0.51   Arthritis    Asthma    As a child    Bronchitis    CAD (coronary artery disease)    a. 09/2016: 50% Ost 1st Mrg stenosis, 50% 2nd Mrg stenosis, 20% Mid-Cx, 95% Prox LAD, 40% mid-LAD, and 10% dist-LAD stenosis. Staged PCI with DES to Prox-LAD.    Chronic combined systolic and diastolic CHF (congestive heart failure) (HCC) 2011   echo 2/18: EF 55-60, normal wall motion, grade 2 diastolic dysfunction, trivial AI // echo 3/18: Septal and apical HK, EF 45-50, normal wall motion, trivial AI, mild LAE, PASP 38 // echo 8/18: EF 60-65, normal wall motion, grade 1 diastolic dysfunction, calcified aortic valve leaflets, mild  aortic stenosis (mean 13, peak 28, LVOT/AV mean velocity 0.51), mild AI, moderate MAC, mild LAE, trivial TR    Chronic kidney disease    STAGE 4   Chronic kidney disease on chronic dialysis (HCC)    t, th, sat   Complication of anesthesia    Depression    Diabetes mellitus Dx 1989   Elevated lipids    GERD (gastroesophageal reflux disease)    Gout    Heart murmur    asymptomatic   Hepatitis C Dx 2013   Hypertension Dx 1989   Infected surgical wound    Lt arm   Myocardial infarction (HCC) 07/2015   Obesity    Pancreatitis 2013   Pneumonia    Refusal of blood transfusions as patient is Jehov[employer]'s Witness    pt states she is not [place] witness and does not refuse blood products   Tendinitis    Tremors of nervous system    LEFT HAND   Ulcer 2010   Past Surgical History  Past Surgical History:  Procedure Laterality Date   A/V FISTULAGRAM Left 04/11/2019   Procedure: A/V FISTULAGRAM;  Surgeon: Renford Dills, MD;  Location: [facility] INVASIVE CV LAB;  Service: Cardiovascular;  Laterality: Left;   A/V FISTULAGRAM Left 06/02/2019   Procedure: A/V FISTULAGRAM;  Surgeon: Renford Dills, MD;  Location: [facility];  Service: Cardiovascular;  Laterality: Left;   AMPUTATION Left 10/28/2022   Procedure: LEFT FOOT 1ST [place] AMPUTATION;  Surgeon: Nadara Mustard, MD;  Location: [facility] OR;  Service: Orthopedics;  Laterality: Left;   APPLICATION OF WOUND VAC Left 08/14/2017   Procedure: APPLICATION OF WOUND VAC Exchange;  Surgeon: Earline Mayotte, MD;  Location: [facility] ORS;  Service: General;  Laterality: Left;   APPLICATION OF WOUND VAC Left 12/21/2018   Procedure: APPLICATION OF WOUND VAC;  Surgeon: Renford Dills, MD;  Location: [facility] ORS;  Service: Vascular;  Laterality: Left;   AV FISTULA PLACEMENT Left 08/19/2018   Procedure: ARTERIOVENOUS (AV) FISTULA CREATION ( BRACHIOBASILIC );  Surgeon: Renford Dills, MD;  Location: [facility] ORS;  Service: Vascular;  Laterality: Left;   BASCILIC  VEIN TRANSPOSITION Left 11/18/2018   Procedure: BASCILIC VEIN TRANSPOSITION;  Surgeon: Renford Dills, MD;  Location: [facility] ORS;  Service: Vascular;  Laterality: Left;   BIOPSY  09/20/2020   Procedure: BIOPSY;  Surgeon: Jeani Hawking, MD;  Location: [facility] ENDOSCOPY;  Service: Endoscopy;;   CHOLECYSTECTOMY     COLONOSCOPY WITH PROPOFOL N/A 02/03/2018   Procedure: COLONOSCOPY WITH PROPOFOL;  Surgeon: Toney Reil, MD;  Location: [facility] ENDOSCOPY;  Service: Gastroenterology;  Laterality: N/A;   COLONOSCOPY WITH PROPOFOL N/A 07/25/2022   Procedure: COLONOSCOPY WITH PROPOFOL;  Surgeon: Regis Bill, MD;  Location: [facility] ENDOSCOPY;  Service: Endoscopy;  Laterality: N/A;   CORONARY ANGIOPLASTY  07/2015   STENT   CORONARY STENT INTERVENTION N/A 09/18/2016   Procedure: Coronary Stent Intervention;  Surgeon: Lennette Bihari, MD;  [facility];  Service: Cardiovascular;  Laterality: N/A;   DIALYSIS/PERMA CATHETER INSERTION N/A 05/10/2018   Procedure: DIALYSIS/PERMA CATHETER INSERTION;  Surgeon: Renford Dills, MD;  Location: [facility];  Service: Cardiovascular;  Laterality: N/A;   DRESSING CHANGE UNDER ANESTHESIA Left 08/15/2017   Procedure: exploration of wound for bleeding;  Surgeon: Earline Mayotte, MD;  Location: [facility] ORS;  Service: General;  Laterality: Left;   ESOPHAGOGASTRODUODENOSCOPY (EGD) WITH PROPOFOL N/A 02/03/2018   Procedure: ESOPHAGOGASTRODUODENOSCOPY (EGD) WITH PROPOFOL;  Surgeon: Toney Reil, MD;  Location: [facility] ENDOSCOPY;  Service: Gastroenterology;  Laterality: N/A;   ESOPHAGOGASTRODUODENOSCOPY (EGD) WITH PROPOFOL N/A 09/20/2020   Procedure: ESOPHAGOGASTRODUODENOSCOPY (EGD) WITH PROPOFOL;  Surgeon: Jeani Hawking, MD;  Location: [facility] ENDOSCOPY;  Service: Endoscopy;  Laterality: N/A;   ESOPHAGOGASTRODUODENOSCOPY (EGD) WITH PROPOFOL N/A 07/25/2022   Procedure: ESOPHAGOGASTRODUODENOSCOPY (EGD) WITH PROPOFOL;  Surgeon: Regis Bill, MD;  Location:  [facility] ENDOSCOPY;  Service: Endoscopy;  Laterality: N/A;   EYE SURGERY  11/17/2018   INCISION AND DRAINAGE ABSCESS Left 08/12/2017   Procedure: INCISION AND DRAINAGE ABSCESS;  Surgeon: Earline Mayotte, MD;  Location: [facility] ORS;  Service: General;  Laterality: Left;   KNEE ARTHROSCOPY     LEFT HEART CATH N/A 09/18/2016   Procedure: Left Heart Cath;  Surgeon: Lennette Bihari, MD;  [facility]: [facility];  Service: Cardiovascular;  Laterality: N/A;   LEFT HEART CATH AND CORONARY ANGIOGRAPHY N/A 09/16/2016   Procedure: Left Heart Cath and Coronary Angiography;  Surgeon: Kathleene Hazel, MD;  Location: [facility] INVASIVE CV LAB;  Service: Cardiovascular;  Laterality: N/A;   LEFT HEART CATH AND CORONARY ANGIOGRAPHY N/A 04/29/2017   Procedure: LEFT HEART CATH AND CORONARY ANGIOGRAPHY;  Surgeon: Yvonne Kendall, MD;  [facility]: [facility];  Service: Cardiovascular;  Laterality: N/A;   LOWER EXTREMITY ANGIOGRAPHY Right 03/08/2018   Procedure: LOWER EXTREMITY ANGIOGRAPHY;  Surgeon: Gilda Crease,  Latina Craver, MD;  Location: [facility];  Service: Cardiovascular;  Laterality: Right;   TUBAL LIGATION     TUBAL LIGATION     UPPER EXTREMITY ANGIOGRAPHY Right 09/19/2019   Procedure: UPPER EXTREMITY ANGIOGRAPHY;  Surgeon: Renford Dills, MD;  Location: [facility];  Service: Cardiovascular;  Laterality: Right;   WOUND DEBRIDEMENT Left 12/21/2018   Procedure: DEBRIDEMENT WOUND;  Surgeon: Renford Dills, MD;  Location: [facility] ORS;  Service: Vascular;  Laterality: Left;   WOUND DEBRIDEMENT Left 12/30/2018   Procedure: DEBRIDEMENT WOUND WITH VAC PLACEMENT (LEFT UPPER EXTREMITY);  Surgeon: Renford Dills, MD;  Location: [facility] ORS;  Service: Vascular;  Laterality: Left;   Family History  Family History  Problem Relation Age of Onset   Colon cancer Mother    Heart attack Other    Heart attack Maternal Grandmother    Hypertension Sister    Hypertension Brother    Diabetes Paternal  Grandmother    Breast cancer Neg Hx    Social History  reports that she quit smoking about 42 years ago. Her smoking use included cigarettes. She has a 1.50 pack-year smoking history. She has never used smokeless tobacco. She reports that she does not currently use alcohol. She reports that she does not currently use drugs after having used the following drugs: Marijuana. Allergies  Allergies  Allergen Reactions   Shellfish Allergy Anaphylaxis and Swelling   Diazepam Other (See Comments)    "felt like out of body experience"   Morphine Itching and Other (See Comments)    Tolerated hydromorphone on 07/2020 *will take along with Benadryl*   Home medications Prior to Admission medications   Medication Sig Start Date End Date Taking? Authorizing Provider  albuterol (VENTOLIN HFA) 108 (90 Base) MCG/ACT inhaler Inhale 2 puffs into the lungs every 4 (four) hours as needed for wheezing or shortness of breath. 06/28/20  Yes Margaretann Loveless, PA-C  aspirin 81 MG EC tablet Take 1 tablet (81 mg total) by mouth daily. 09/20/20  Yes Leroy Sea, MD  atorvastatin (LIPITOR) 80 MG tablet Take 1 tablet (80 mg total) by mouth every evening. Patient taking differently: Take 80 mg by mouth daily. 04/24/21  Yes Bacigalupo, Marzella Schlein, MD  carvedilol (COREG) 12.5 MG tablet Take 1 tablet (12.5 mg total) by mouth 2 (two) times daily with a meal. 09/19/21  Yes Paige, Victoria J, DO  Continuous Blood Gluc Receiver (FREESTYLE LIBRE 14 DAY READER) DEVI To check blood sugar ACHS 09/27/20  Yes Margaretann Loveless, PA-C  Continuous Blood Gluc Sensor (FREESTYLE LIBRE 14 DAY SENSOR) MISC To check blood sugar ACHS; change every 14 days 09/27/20  Yes Burnette, Victorino Dike M, PA-C  fluticasone (FLONASE) 50 MCG/ACT nasal spray Place 2 sprays into both nostrils daily as needed for allergies or rhinitis. 09/28/17  Yes Joycelyn Man M, PA-C  fluticasone furoate-vilanterol (BREO ELLIPTA) 200-25 MCG/INH AEPB Inhale 1 puff into  the lungs daily. 08/30/20  Yes Joycelyn Man M, PA-C  Glucose Blood (BLOOD GLUCOSE TEST STRIPS 333) STRP Enough test stripes to test blood sugar TID x 30 days. No refills 04/16/22  Yes Charise Killian, MD  HUMULIN 70/30 KWIKPEN (70-30) 100 UNIT/ML KwikPen Inject 30 Units into the skin 2 (two) times daily. 05/26/22  Yes [provider]  hydrOXYzine (ATARAX/VISTARIL) 25 MG tablet Take 1 tablet (25 mg total) by mouth 4 (four) times daily. Patient taking differently: Take 25 mg by mouth 2 (two) times daily. 12/03/20  Yes  Erasmo Downer, MD  Insulin Pen Needle 32G X 4 MM MISC Enough pen needles for 100 units of novolin QAM x 30 days. No refills 04/16/22  Yes Charise Killian, MD  LEVEMIR FLEXPEN 100 UNIT/ML FlexPen Inject 25 Units into the skin daily. 11/02/22  Yes Nadara Mustard, MD  methocarbamol (ROBAXIN) 500 MG tablet Take 1 tablet (500 mg total) by mouth 2 (two) times daily. 11/02/22  Yes Nadara Mustard, MD  midodrine (PROAMATINE) 10 MG tablet Take 10 mg by mouth See admin instructions. Take 10 mg by mouth 30 minutes before dialysis on Tues/Thurs/Sat 12/23/21  Yes [provider]  Oxycodone HCl 10 MG TABS Take 10 mg by mouth 4 (four) times daily. 04/01/22  Yes [provider]  pregabalin (LYRICA) 100 MG capsule Take 1 capsule (100 mg total) by mouth 2 (two) times daily. 11/02/22  Yes Nadara Mustard, MD  torsemide (DEMADEX) 10 MG tablet Take 10 mg by mouth See admin instructions. Take 10 mg by mouth every other morning   Yes [provider]  valACYclovir (VALTREX) 500 MG tablet TAKE (1) TABLET BY MOUTH EVERY OTHER DAY. Patient taking differently: Take 500 mg by mouth daily. 01/24/21  Yes Bacigalupo, Marzella Schlein, MD  VELPHORO 500 MG chewable tablet Chew 1,500 mg by mouth 3 (three) times daily. 09/11/21  Yes [provider]  naloxone (NARCAN) nasal spray 4 mg/0.1 mL Place 1 spray into the nose as directed. 04/08/22   [provider]  nitroGLYCERIN  (NITROSTAT) 0.4 MG SL tablet Place 1 tablet (0.4 mg total) under the tongue every 5 (five) minutes x 3 doses as needed for chest pain. 05/03/20   Margaretann Loveless, PA-C  pantoprazole (PROTONIX) 40 MG tablet Take 40 mg by mouth daily before breakfast.    [provider]  gabapentin (NEURONTIN) 100 MG capsule Take 1 capsule (100 mg total) by mouth 3 (three) times daily. 08/19/17 02/03/19  Enedina Finner, MD     Vitals:   11/14/22 2213 11/15/22 0433 11/15/22 0802  BP: (!) 136/55 (!) 117/41 (!) 115/52  Pulse: 69 71 66  Resp: 19 18 17   Temp: 97.9 F (36.6 C) 98 F (36.7 C) 97.9 F (36.6 C)  TempSrc: Oral Oral Oral  SpO2: 99% 99% 96%  Height: 5\' 5"  (1.651 m)     Exam Gen alert, no distress No rash, cyanosis or gangrene Sclera anicteric, throat clear  No jvd or bruits Chest clear bilat to bases, no rales/ wheezing RRR no MRG Abd soft ntnd no mass or ascites +bs GU defer MS left foot post op w/ swelling and erythema Ext 1+ bilat pretib edema, no wounds or ulcers Neuro is alert, Ox 3 , nf    LUA AVF+bruit    Home meds include - aspirin, coreg 12.5 bid, breoellipta, insulin 70-30 30u bid, midodrine 10 mg pre hd tts, sl ntg, oxy IR, protonix, lyrica 100 bid, demadex 10mg  every 48 hrs, valtrex qod 500mg , po vanc 125 qid, velphoro 1500mg  ac tid, prns/ vits/ supps   OP HD: [place] [facility] TTS - 4h  107.5kg  450 bfr  LUA AVF  2/2 bath  Heparin 3000  - missed Sat HD, last HD was Thursday 5/02   Assessment/ Plan: Diabetic foot infection - sp recent L 1st [place] amputation, here now with abscessed foot per MRI. Getting IV abx per pmd. Ortho consulted.  Recent Cdif colitis - per pmd Hyperkalemia - K+ 5.8, giving lokelma x 2 tonight, low  K+ bath w/ HD tomorrow.  ESRD - on HD TTS in [place], [place]. Is in [place] as daughter lives here and lots of her doctors are in [place]. Plan 3h HD tomorrow to make for missed HD Sat, then resume TTS. Try and get records from pt's HD unit.  HTN/ volume -  BP's are wnl, no vol excess on exam, on room air.  Anemia esrd - Hb 10-12, no esa needs.  MBD ckd - CCa in range, add on phos. Cont binders.  COPD per pmd    Vinson Moselle  MD [facility] 11/15/2022, 2:34 PM  Recent Labs  Lab 11/14/22 2257 11/15/22 0357 11/15/22 0948  HGB 11.2* 10.6*  --   ALBUMIN  --   --  2.9*  CALCIUM  --   --  9.0  CREATININE 8.48*  --  9.26*  K  --   --  5.8*   Inpatient medications:  aspirin EC  81 mg Oral Daily   atorvastatin  80 mg Oral QPM   carvedilol  12.5 mg Oral BID WC   fluticasone furoate-vilanterol  1 puff Inhalation Daily   heparin  5,000 Units Subcutaneous Q8H   hydrOXYzine  25 mg Oral BID   insulin aspart  0-15 Units Subcutaneous TID WC   insulin aspart protamine- aspart  30 Units Subcutaneous BID WC   insulin glargine-yfgn  15 Units Subcutaneous QHS   methocarbamol  500 mg Oral BID   [START ON 11/17/2022] midodrine  10 mg Oral Q T,Th,Sa-HD   nitroGLYCERIN  0.2 mg Transdermal Daily   oxyCODONE  10 mg Oral QID   [START ON 11/16/2022] pantoprazole  40 mg Oral QAC breakfast   pregabalin  100 mg Oral BID   saccharomyces boulardii  250 mg Oral BID   sodium zirconium cyclosilicate  10 g Oral BID   sucroferric oxyhydroxide  1,500 mg Oral TID   [START ON 11/16/2022] torsemide  10 mg Oral Q48H   valACYclovir  500 mg Oral QODAY   vancomycin  125 mg Oral QID    piperacillin-tazobactam (ZOSYN)  IV 2.25 g (11/15/22 1312)   [START ON 11/17/2022] vancomycin     acetaminophen **OR** acetaminophen, fluticasone, HYDROmorphone (DILAUDID) injection, nitroGLYCERIN

## 2022-11-15 NOTE — Progress Notes (Signed)
   11/15/22 0009  Provider Notification  Provider Name/Title Dr. Joneen Roach  Date Provider Notified 11/15/22  Time Provider Notified 0008  Method of Notification  [facility])  Notification Reason Critical Result (Lactic 2.2)  Provider response Other (Comment) (Pt currently on vanc zosyn)  Date of Provider Response 11/15/22  Time of Provider Response 0009

## 2022-11-15 NOTE — Progress Notes (Signed)
Orthopedic Tech Progress Note Patient Details:  [name] 01/12/1956 [id number]  Ortho Devices Type of Ortho Device: CAM walker Ortho Device/Splint Location: LLE Ortho Device/Splint Interventions: Ordered   Post Interventions Instructions Provided: Care of device, Adjustment of device Cam walker left at bedside, only to be worn when gait training and OOB. Darleen Crocker 11/15/2022, 11:56 AM

## 2022-11-15 NOTE — Progress Notes (Signed)
RT came to inquire about CPAP at night. Patient stated she didn't wear one at home currently, hadn't had a sleep study done in a long time, but would try it. After RT set unit up and placed on patient MD came in. Patient was already not tolerating machine and MD said to just take it off and she would DC order. Left circuit and mask at bedside, removed unit from room.

## 2022-11-15 NOTE — Progress Notes (Addendum)
PROGRESS NOTE   [name]  VOZ:[id number]    DOB: [birth year]    DOA: 11/14/2022  PCP: Felix Pacini, FNP   I have briefly reviewed patients previous medical records in [facility].   Brief Narrative:  67 year old female with medical history significant for ESRD on TTS HD, type II/IDDM, HLD, CAD, HTN, GERD, hepatitis C and depression, recent hospitalization 10/20/2022 - 11/01/2022 for acute metabolic encephalopathy, osteomyelitis of the left foot and C. difficile pancolitis.  During that admission, she underwent left foot first Mcquitty amputation by Dr. Lajoyce Corners on 4/17, complete course of IV vancomycin and ceftriaxone and was discharged home with wound VAC x 1 week with outpatient follow-up in Ortho office.  She presented back to the ED on 11/14/2022 with complaints of left foot surgical site sutures opening up, drainage from surgical site, swelling and pain of left foot with inability to weight-bear or put on her postop shoe.  Admitted for possible left postop diabetic foot infection.  Orthopedics/Dr. Lajoyce Corners and nephrology consulted.   Assessment & Plan:  Principal Problem:   Diabetic foot infection (HCC) Active Problems:   COPD (chronic obstructive pulmonary disease) (HCC)   Chronic hepatitis C without hepatic coma (HCC)   Depression   CAD (coronary artery disease)   Chronic diastolic heart failure (HCC)   ESRD (end stage renal disease) (HCC)   Left postop diabetic foot infection: History as noted above.  Discussed with Dr. Lajoyce Corners and his input appreciated.  Indicates that she has some mild ischemic changes of the second toe and has developed an area of skin ischemic breakdown over the dorsum of the foot from using her postop shoe.  He recommends NTG patch, cam boot, WBAT, SNF discharge and follow-up in the office in a week.  Currently on vancomycin across dialysis and on IV Zosyn.  Will need to review with Dr. Lajoyce Corners regarding further antibiotic management.  Multimodality pain  control.  Recent C. difficile pancolitis: Completed oral vancomycin course on day of hospital admission.  Diarrhea significantly improved.  However given ongoing IV antibiotics for foot infection, oral vancomycin continued to prevent recurrence of C. difficile diarrhea.  On probiotics.  ESRD on TTS HD: Discussed with Dr. Arlean Hopping who was planning to have her on a short dialysis tomorrow followed by her regular dialysis cycle from Tuesday.  Reportedly missed HD on 5/2.  Since this morning's discussion with Dr. Arlean Hopping, noted hyperkalemia with potassium of 5.8.  Will give a dose of Lokelma and review with him regarding need for dialysis today.  Discussed with Dr. Arlean Hopping, agrees with Thompson Caul, plans to dialyze her tonight.  COPD: Stable without clinical exacerbation.  As needed bronchodilators.  Continue Breo Ellipta.  Essential hypertension:  Controlled.  Continue carvedilol 12.5 Mg twice daily.  Dyslipidemia: Continue atorvastatin.  Type II DM with renal complications, with long-term use of insulins: A1c 8.8 on 07/23/2022. Patient appears to be on a unusual insulin regimen at home including Humulin 70/30, 30 units twice daily and Levemir 30 units twice daily.  Unclear why she has not been consolidated into a single form of insulin.  Currently on home dose of 70/30 insulin and reduced dose of Levemir 15 units at bedtime.  Also on SSI.  Adjust insulins as needed.  Continue Lyrica for peripheral neuropathy.  Reviewed insulin regimen just now on the phone with patient but she is unable to tell me clearly.  States he just woke up and cannot recollect and will call us back when she is  able to.  Consulted diabetes coordinator for assistance.  CAD No anginal symptoms.  Continue beta-blockers and statins.  Chronic diastolic CHF: Volume management across HD.  Clinically euvolemic.  PAD Appears that local ischemic changes of left foot are due to pressure injury as per orthopedics.  Resume aspirin,  continue statins.  GERD Continue statins.  Anemia: Appears new compared to recent discharge when hemoglobin was in the 12 g range.  No overt bleeding.  May be multifactorial due to infection, ESRD etc.  Follow CBC periodically across dialysis and transfuse if hemoglobin 7 g or less.  Thrombocytopenia: Chronic and intermittent.  Stable.  Body mass index is 40.17 kg/m./Morbid obesity  ACP Documents: None present DVT prophylaxis: heparin injection 5,000 Units Start: 11/14/22 2330     Code Status: Full Code:  Family Communication: None at bedside Disposition:  Status is: Inpatient Remains inpatient appropriate because: IV antibiotics, pain management, DC home pending orthopedic clearance     Consultants:   Orthopedics/Dr. Lajoyce Corners Nephrology  Procedures:     Antimicrobials:   As above   Subjective:  History as noted above.  Ongoing left foot pain.  Objective:   Vitals:   11/14/22 2213 11/15/22 0433 11/15/22 0802  BP: (!) 136/55 (!) 117/41 (!) 115/52  Pulse: 69 71 66  Resp: 19 18 17   Temp: 97.9 F (36.6 C) 98 F (36.7 C) 97.9 F (36.6 C)  TempSrc: Oral Oral Oral  SpO2: 99% 99% 96%  Height: 5\' 5"  (1.651 m)      General exam: Middle-age female, moderately built and obese sitting up in bed without distress. Respiratory system: Clear to auscultation. Respiratory effort normal. Cardiovascular system: S1 & S2 heard, RRR. No JVD, murmurs, rubs, gallops or clicks. No pedal edema.  Telemetry personally reviewed: Sinus rhythm. Gastrointestinal system: Abdomen is nondistended, soft and nontender. No organomegaly or masses felt. Normal bowel sounds heard. Central nervous system: Alert and oriented. No focal neurological deficits. Extremities: Symmetric 5 x 5 power.  S/p left first Prosise amputation, incision site sutures slightly opened up but no drainage.  Darkish discoloration of the dorsum of second toe.  Mild increased warmth.  Tenderness present.  No areas of fluctuation  noted. Skin: No rashes, lesions or ulcers Psychiatry: Judgement and insight appear normal. Mood & affect appropriate.     Data Reviewed:   I have personally reviewed following labs and imaging studies   CBC: Recent Labs  Lab 11/14/22 2257 11/15/22 0357  WBC 6.0 6.4  NEUTROABS  --  4.3  HGB 11.2* 10.6*  HCT 37.6 33.8*  MCV 92.6 89.9  PLT 154 146*    Basic Metabolic Panel: Recent Labs  Lab 11/14/22 2257 11/15/22 0948  NA  --  132*  K  --  5.8*  CL  --  92*  CO2  --  26  GLUCOSE  --  221*  BUN  --  48*  CREATININE 8.48* 9.26*  CALCIUM  --  9.0  MG 2.0  --     Liver Function Tests: Recent Labs  Lab 11/15/22 0948  AST 49*  ALT 53*  ALKPHOS 348*  BILITOT 0.5  PROT 6.7  ALBUMIN 2.9*    CBG: Recent Labs  Lab 11/15/22 0430 11/15/22 0802 11/15/22 1128  GLUCAP 242* 228* 181*    Microbiology Studies:  No results found for this or any previous visit (from the past 240 hour(s)).  Radiology Studies:  No results found.  Scheduled Meds:    atorvastatin  80 mg Oral  QPM   carvedilol  12.5 mg Oral BID WC   fluticasone furoate-vilanterol  1 puff Inhalation Daily   heparin  5,000 Units Subcutaneous Q8H   insulin aspart  0-15 Units Subcutaneous TID WC   insulin aspart protamine- aspart  30 Units Subcutaneous BID WC   insulin glargine-yfgn  15 Units Subcutaneous QHS   [START ON 11/17/2022] midodrine  10 mg Oral Q T,Th,Sa-HD   nitroGLYCERIN  0.2 mg Transdermal Daily   pregabalin  100 mg Oral BID   saccharomyces boulardii  250 mg Oral BID   [START ON 11/16/2022] torsemide  10 mg Oral Q48H   vancomycin  125 mg Oral QID    Continuous Infusions:    piperacillin-tazobactam (ZOSYN)  IV 2.25 g (11/15/22 1312)   [START ON 11/17/2022] vancomycin       LOS: 1 day     Marcellus Scott, MD,  FACP, FHM, SFHM, [facility], [facility]   [facility] [place]     To contact the attending provider between 7A-7P or the covering provider during  after hours 7P-7A, please log into the web site [email or web address] and access using universal [place] password for that web site. If you do not have the password, please call the hospital operator.  11/15/2022, 1:47 PM

## 2022-11-15 NOTE — Progress Notes (Signed)
Patient ID: [name], female   DOB: 1955/08/27, 67 y.o.   MRN: [id number] Patient seen in follow-up status post first Markin amputation left foot.  The foot has good wrinkling of the skin there is healthy granulation tissue along the surgical incision.  There is some mild ischemic changes to the second toe.  Patient has developed an area of skin ischemic breakdown over the dorsum of the foot from using her postoperative shoe.  Will place an order for nitroglycerin patch to be changed daily over the dorsum of the ankle change locations daily.  Patient does not appear to be safe for discharge to home and will place an order for discharge to skilled nursing.  Patient complains of increased ischemic pain we will place an order for Dilaudid 2 mg.  Will place an order for a cam boot she may be weightbearing as tolerated and I will follow-up in the office in a week.

## 2022-11-16 DIAGNOSIS — L089 Local infection of the skin and subcutaneous tissue, unspecified: Secondary | ICD-10-CM | POA: Diagnosis not present

## 2022-11-16 DIAGNOSIS — E875 Hyperkalemia: Secondary | ICD-10-CM | POA: Diagnosis not present

## 2022-11-16 DIAGNOSIS — E11628 Type 2 diabetes mellitus with other skin complications: Secondary | ICD-10-CM | POA: Diagnosis not present

## 2022-11-16 LAB — COMPREHENSIVE METABOLIC PANEL
ALT: 53 U/L — ABNORMAL HIGH (ref 0–44)
AST: 48 U/L — ABNORMAL HIGH (ref 15–41)
Albumin: 3 g/dL — ABNORMAL LOW (ref 3.5–5.0)
Alkaline Phosphatase: 337 U/L — ABNORMAL HIGH (ref 38–126)
Anion gap: 19 — ABNORMAL HIGH (ref 5–15)
BUN: 58 mg/dL — ABNORMAL HIGH (ref 8–23)
CO2: 24 mmol/L (ref 22–32)
Calcium: 9 mg/dL (ref 8.9–10.3)
Chloride: 93 mmol/L — ABNORMAL LOW (ref 98–111)
Creatinine, Ser: 10.48 mg/dL — ABNORMAL HIGH (ref 0.44–1.00)
GFR, Estimated: 4 mL/min — ABNORMAL LOW (ref >60)
Glucose, Bld: 137 mg/dL — ABNORMAL HIGH (ref 70–99)
Potassium: 5.8 mmol/L — ABNORMAL HIGH (ref 3.5–5.1)
Sodium: 136 mmol/L (ref 135–145)
Total Bilirubin: 0.3 mg/dL (ref 0.3–1.2)
Total Protein: 6.9 g/dL (ref 6.5–8.1)

## 2022-11-16 LAB — CBC
HCT: 34.5 % — ABNORMAL LOW (ref 36.0–46.0)
Hemoglobin: 10.5 g/dL — ABNORMAL LOW (ref 12.0–15.0)
MCH: 28.3 pg (ref 26.0–34.0)
MCHC: 30.4 g/dL (ref 30.0–36.0)
MCV: 93 fL (ref 80.0–100.0)
Platelets: 185 10*3/uL (ref 150–400)
RBC: 3.71 MIL/uL — ABNORMAL LOW (ref 3.87–5.11)
RDW: 16 % — ABNORMAL HIGH (ref 11.5–15.5)
WBC: 6.5 10*3/uL (ref 4.0–10.5)
nRBC: 0 % (ref 0.0–0.2)

## 2022-11-16 LAB — CULTURE, BLOOD (ROUTINE X 2)

## 2022-11-16 LAB — GLUCOSE, CAPILLARY
Glucose-Capillary: 122 mg/dL — ABNORMAL HIGH (ref 70–99)
Glucose-Capillary: 151 mg/dL — ABNORMAL HIGH (ref 70–99)
Glucose-Capillary: 164 mg/dL — ABNORMAL HIGH (ref 70–99)
Glucose-Capillary: 192 mg/dL — ABNORMAL HIGH (ref 70–99)
Glucose-Capillary: 193 mg/dL — ABNORMAL HIGH (ref 70–99)

## 2022-11-16 MED ORDER — VANCOMYCIN HCL 750 MG/150ML IV SOLN
750.0000 mg | Freq: Once | INTRAVENOUS | Status: AC
  Administered 2022-11-16: 750 mg via INTRAVENOUS
  Filled 2022-11-16 (×2): qty 150

## 2022-11-16 MED ORDER — HEPARIN SODIUM (PORCINE) 1000 UNIT/ML IJ SOLN: 3000.0000 [IU] | Freq: Once | INTRAMUSCULAR | Status: DC

## 2022-11-16 MED ORDER — HEPARIN SODIUM (PORCINE) 1000 UNIT/ML DIALYSIS
3000.0000 [IU] | Freq: Once | INTRAMUSCULAR | Status: DC
  Filled 2022-11-16: qty 3

## 2022-11-16 MED ORDER — VANCOMYCIN HCL 750 MG/150ML IV SOLN: 750.0000 mg | Freq: Once | INTRAVENOUS | Status: DC

## 2022-11-16 NOTE — Progress Notes (Signed)
Pt transported to HD via bed by transportation staff.

## 2022-11-16 NOTE — Progress Notes (Signed)
PROGRESS NOTE   OTELLA [name]  YQM:[id number]    DOB: Jun 06, 1956    DOA: 11/14/2022  PCP: Felix Pacini, FNP   I have briefly reviewed patients previous medical records in [facility].   Brief Narrative:  67 year old female with medical history significant for ESRD on TTS HD, type II/IDDM, HLD, CAD, HTN, GERD, hepatitis C and depression, recent hospitalization 10/20/2022 - 11/01/2022 for acute metabolic encephalopathy, osteomyelitis of the left foot and C. difficile pancolitis.  During that admission, she underwent left foot first [place] amputation by Dr. Lajoyce Corners on 4/17, complete course of IV vancomycin and ceftriaxone and was discharged home with wound VAC x 1 week with outpatient follow-up in Ortho office.  She presented back to the ED on 11/14/2022 with complaints of left foot surgical site sutures opening up, drainage from surgical site, swelling and pain of left foot with inability to weight-bear or put on her postop shoe.  Admitted for possible left postop diabetic foot infection.  Orthopedics/Dr. Lajoyce Corners and nephrology consulted.   Assessment & Plan:  Principal Problem:   Diabetic foot infection (HCC) Active Problems:   COPD (chronic obstructive pulmonary disease) (HCC)   Chronic hepatitis C without hepatic coma (HCC)   Depression   CAD (coronary artery disease)   Chronic diastolic heart failure (HCC)   ESRD (end stage renal disease) (HCC)   Type 2 diabetes mellitus with diabetic foot infection (HCC)   Left postop diabetic foot infection: History as noted above.  Discussed with Dr. Lajoyce Corners and his input appreciated.  Indicates that she has some mild ischemic changes of the second toe and has developed an area of skin ischemic breakdown over the dorsum of the foot from using her postop shoe.  He recommends NTG patch, cam boot, WBAT, SNF discharge and follow-up in the office in a week.  Currently on vancomycin across dialysis and on IV Zosyn.  Multimodality pain control.  As communicated  with Dr. Lajoyce Corners on 5/6: No surgery planned, recommends oral doxycycline at discharge.  PT and OT consulted.  Recent C. difficile pancolitis: Completed oral vancomycin course on day of hospital admission.  Diarrhea significantly improved.  However given ongoing IV antibiotics for foot infection, oral vancomycin continued to prevent recurrence of C. difficile diarrhea.  On probiotics.  ESRD on TTS HD/hyperkalemia: Missed HD on 5/4.  As was planned, did not undergo dialysis last night.  Seen at HD this morning.  Resuming her regular schedule of TTS from tomorrow.  Did get Lokelma x 2 doses without much change in hyperkalemia.  Hyperkalemia addressed by HD.    COPD: Stable without clinical exacerbation.  As needed bronchodilators.  Continue Breo Ellipta.  Essential hypertension:  Controlled.  Continue carvedilol 12.5 Mg twice daily.  Dyslipidemia: Continue atorvastatin.  Type II DM with renal complications, with long-term use of insulins: A1c 8.8 on 07/23/2022. Patient appears to be on a unusual insulin regimen at home including Humulin 70/30, 30 units twice daily and Levemir 30 units twice daily.  Unclear why she has not been consolidated into a single form of insulin.  Currently on home dose of 70/30 insulin and reduced dose of Levemir 15 units at bedtime.  Also on SSI.  Adjust insulins as needed.  Continue Lyrica for peripheral neuropathy. Consulted diabetes coordinator for assistance.  Still unable to get clarity on her home insulin regimen.  Currently reasonably controlled in the low to high 100s on current regimen, continue pending input from DM coordinator.  CAD No anginal symptoms.  Continue beta-blockers and statins.  Chronic diastolic CHF: Volume management across HD.  Clinically euvolemic.  PAD Appears that local ischemic changes of left foot are due to pressure injury as per orthopedics.  Resume aspirin, continue statins.  GERD Continue statins.  Anemia: Appears new compared to  recent discharge when hemoglobin was in the 12 g range.  No overt bleeding.  May be multifactorial due to infection, ESRD etc.  Follow CBC periodically across dialysis and transfuse if hemoglobin 7 g or less.  Stable.  Thrombocytopenia: Chronic and intermittent.  Resolved on today's labs.  Body mass index is 40.17 kg/m./Morbid obesity  ACP Documents: None present DVT prophylaxis: heparin injection 5,000 Units Start: 11/14/22 2330     Code Status: Full Code:  Family Communication: Daughter via phone. Disposition:  Status is: Inpatient Remains inpatient appropriate because: IV antibiotics, pain management, DC home pending orthopedic clearance     Consultants:   Orthopedics/Dr. Lajoyce Corners Nephrology  Procedures:   HD.  Antimicrobials:   As above   Subjective:  Ongoing left foot pain.  Appears to be a limited/poor historian.  Objective:   Vitals:   11/16/22 1220 11/16/22 1230 11/16/22 1246 11/16/22 1352  BP: 103/85 98/69 121/63 102/66  Pulse: 72 74 72 76  Resp: 15 11 14 17   Temp:    98.1 F (36.7 C)  TempSrc:    Oral  SpO2: 94% 98% 98% 94%  Height:        General exam: Middle-age female, moderately built and obese sitting up in bed without distress.  Undergoing HD. Respiratory system: Clear to auscultation.  No increased work of breathing. Cardiovascular system: S1 and S2 heard, RRR.  No JVD, murmurs or pedal edema.. Gastrointestinal system: Abdomen is nondistended, soft and nontender. No organomegaly or masses felt. Normal bowel sounds heard. Central nervous system: Alert and oriented. No focal neurological deficits. Extremities: Symmetric 5 x 5 power.  Left foot looks better.  Decreased swelling.  S/p left first Nordlund amputation, incisions more closed.  No drainage in dry.  Darkish discoloration of the dorsum of second toe and to a lesser extent the third and fourth toe as well.  No increased warmth.  Tenderness present but less.  No areas of fluctuation noted. Skin: No  rashes, lesions or ulcers Psychiatry: Judgement and insight appears impaired. Mood & affect appropriate.     Data Reviewed:   I have personally reviewed following labs and imaging studies   CBC: Recent Labs  Lab 11/14/22 2257 11/15/22 0357 11/16/22 0409  WBC 6.0 6.4 6.5  NEUTROABS  --  4.3  --   HGB 11.2* 10.6* 10.5*  HCT 37.6 33.8* 34.5*  MCV 92.6 89.9 93.0  PLT 154 146* 185    Basic Metabolic Panel: Recent Labs  Lab 11/14/22 2257 11/15/22 0948 11/16/22 0409  NA  --  132* 136  K  --  5.8* 5.8*  CL  --  92* 93*  CO2  --  26 24  GLUCOSE  --  221* 137*  BUN  --  48* 58*  CREATININE 8.48* 9.26* 10.48*  CALCIUM  --  9.0 9.0  MG 2.0  --   --     Liver Function Tests: Recent Labs  Lab 11/15/22 0948 11/16/22 0409  AST 49* 48*  ALT 53* 53*  ALKPHOS 348* 337*  BILITOT 0.5 0.3  PROT 6.7 6.9  ALBUMIN 2.9* 3.0*    CBG: Recent Labs  Lab 11/16/22 0109 11/16/22 0531 11/16/22 1442  GLUCAP 151* 164*  122*    Microbiology Studies:   Recent Results (from the past 240 hour(s))  Culture, blood (Routine X 2) w Reflex to ID Panel     Status: None (Preliminary result)   Collection Time: 11/15/22  9:47 AM   Specimen: BLOOD  Result Value Ref Range Status   Specimen Description BLOOD SITE NOT SPECIFIED  Final   Special Requests   Final    BOTTLES DRAWN AEROBIC ONLY Blood Culture results may not be optimal due to an inadequate volume of blood received in culture bottles   Culture   Final    NO GROWTH 1 DAY Performed at [facility], [address], [place], [place] [address]    Report Status PENDING  Incomplete  Culture, blood (Routine X 2) w Reflex to ID Panel     Status: None (Preliminary result)   Collection Time: 11/15/22  9:49 AM   Specimen: BLOOD  Result Value Ref Range Status   Specimen Description BLOOD SITE NOT SPECIFIED  Final   Special Requests   Final    BOTTLES DRAWN AEROBIC ONLY Blood Culture results may not be optimal due to an inadequate  volume of blood received in culture bottles   Culture   Final    NO GROWTH 1 DAY Performed at [facility], [address], [place], [place] [address]    Report Status PENDING  Incomplete    Radiology Studies:  No results found.  Scheduled Meds:    aspirin EC  81 mg Oral Daily   atorvastatin  80 mg Oral QPM   carvedilol  12.5 mg Oral BID WC   Chlorhexidine Gluconate Cloth  6 each Topical Q0600   fluticasone furoate-vilanterol  1 puff Inhalation Daily   heparin  5,000 Units Subcutaneous Q8H   hydrOXYzine  25 mg Oral BID   insulin aspart  0-15 Units Subcutaneous TID WC   insulin aspart protamine- aspart  30 Units Subcutaneous BID WC   insulin glargine-yfgn  15 Units Subcutaneous QHS   methocarbamol  500 mg Oral BID   [START ON 11/17/2022] midodrine  10 mg Oral Q T,Th,Sa-HD   nitroGLYCERIN  0.2 mg Transdermal Daily   oxyCODONE  10 mg Oral QID   pantoprazole  40 mg Oral QAC breakfast   pregabalin  100 mg Oral BID   saccharomyces boulardii  250 mg Oral BID   sucroferric oxyhydroxide  1,500 mg Oral TID   torsemide  10 mg Oral Q48H   valACYclovir  500 mg Oral QODAY   vancomycin  125 mg Oral QID    Continuous Infusions:    piperacillin-tazobactam (ZOSYN)  IV 2.25 g (11/16/22 0532)   [START ON 11/17/2022] vancomycin       LOS: 2 days     Marcellus Scott, MD,  FACP, FHM, [facility], [facility], [facility]   [facility] Ad[facility] [place]     To contact the attending provider between 7A-7P or the covering provider during after hours 7P-7A, please log into the web site [email or web address] and access using universal [place] password for that web site. If you do not have the password, please call the hospital operator.  11/16/2022, 3:33 PM

## 2022-11-16 NOTE — Procedures (Signed)
HD Note:  Some information was entered later than the data was gathered due to patient care needs. The stated time with the data is accurate.  Received patient in bed to unit.  Alert and oriented.  Informed consent signed and in chart. Bed had technical difficulty, unable to weight  TX duration: 3.25 hours  Patient rested with eyes closed during most of the treatment. Near the end of the treatment patient had intense pain in her left foot.  See flowsheet, seeMAR Transported back to the room  Resting with eyes closed, responds to voice, medication given has been effective with pain.  Patient sleeping soundly  Hand-off given to patient's nurse.   Access used: Left upper arm fistula Access issues: None  Total UF removed: 2200 ml   [facility]

## 2022-11-16 NOTE — Inpatient Diabetes Management (Addendum)
Inpatient Diabetes Program Recommendations  AACE/ADA: New Consensus Statement on Inpatient Glycemic Control (2015)  Target Ranges:  Prepandial:   less than 140 mg/dL      Peak postprandial:   less than 180 mg/dL (1-2 hours)      Critically ill patients:  140 - 180 mg/dL    Latest Reference Range & Units 11/14/22 23:25 11/15/22 04:30 11/15/22 08:02 11/15/22 11:28 11/15/22 16:12 11/15/22 22:10  Glucose-Capillary 70 - 99 mg/dL 161 (H)    15 units Semglee @0017  242 (H) 228 (H)  5 units Novolog @0929   30 units 70/30 Insulin @0932  181 (H)  3 units Novolog @1316  179 (H)  3 units Novolog @1901   30 units 70/30 Insulin @1908  182 (H)    15 units Semglee @2251   (H): Data is abnormally high  Latest Reference Range & Units 11/16/22 01:09 11/16/22 05:31  Glucose-Capillary 70 - 99 mg/dL 096 (H) 045 (H)  (H): Data is abnormally high   Admit with:  Diabetic foot infection with abscess status post first [place] amputation   History: DM, ESRD  Home DM Meds: Humulin 70/30 Insulin 30 units BID       Levemir 25 units Daily  Current Orders: Semglee 15 units QHS      Novolog Moderate Correction Scale/ SSI (0-15 units) TID AC       70/30 Insulin 30 units BID      Looks like pt was discharged from [facility] hospital 11/01/2022 by Dr. Isidoro Donning Was instructed to take the following: Stop NPH Insulin Levemir 25 units Daily Humulin 70/30 Insulin 30 units BID  Called pt's PCP office today: Newton Pigg, NP with [facility] Pt was last seen 11/04/2022 for hospital follow up appt The Office Rep reviewed with me that pt is supposed to be taking the following:  Levemir 60 units Daily Humulin 70/30 Insulin 30 units BID    MD- Note 70/30 Insulin started yesterday AM.  CBGs improved throughout the afternoon.   Getting HD today  Have sent Secure Chat to RN requesting Post-HD CBG  Please note that AM dose 70/30 Insulin was Not given as pt was off floor for HD  Will follow and assist with  glucose management    --Will follow patient during hospitalization--  Ambrose Finland RN, MSN, CDCES Diabetes Coordinator Inpatient Glycemic Control Team Team Pager: [phone] (8a-5p)

## 2022-11-16 NOTE — Progress Notes (Addendum)
Nephrology Follow-Up Consult note   Assessment/Recommendations: [name] is a/an 67 y.o. female with a past medical history significant for CAD, CHF, DM2, ESRD, admitted for foot infection.        OP HD: [place] [facility] - 4h  107.5kg  450 bfr  LUA AVF  2/2 bath  Heparin 3000  - missed Sat HD, last HD was Thursday 5/02     Diabetic foot infection - sp recent L 1st [place]le[place] amputation, here now with abscessed foot per MRI. Getting IV abx per pmd. Ortho consulted.  Recent Cdif colitis - per pmd Hyperkalemia -Lokelma given.  Continue with hemodialysis today ESRD - on HD TTS in [place], [place]. Is in [place] as daughter lives here and lots of her doctors are in [place]. Plan 3h HD today to make for missed HD Sat, then resume TTS. Try and get records from pt's HD unit.  HTN/ volume - BP's are wnl, no vol excess on exam, on room air.  Anemia esrd - Hb 10-12, no esa needs.  MBD ckd - CCa in range, add on phos. Cont binders.  COPD per pmd   Recommendations conveyed to primary service.    Darnell Level [facility] 11/16/2022 11:57 AM  ___________________________________________________________  CC: Foot infection  Interval History/Subjective: Patient has foot pain but otherwise no complaints.   Medications:  Current Facility-Administered Medications  Medication Dose Route Frequency Provider Last Rate Last Admin   acetaminophen (TYLENOL) tablet 650 mg  650 mg Oral Q6H PRN Crosley, Debby, MD       Or   acetaminophen (TYLENOL) suppository 650 mg  650 mg Rectal Q6H PRN Crosley, Debby, MD       aspirin EC tablet 81 mg  81 mg Oral Daily Hongalgi, Anand D, MD   81 mg at 11/15/22 1445   atorvastatin (LIPITOR) tablet 80 mg  80 mg Oral QPM Crosley, Debby, MD   80 mg at 11/15/22 1900   carvedilol (COREG) tablet 12.5 mg  12.5 mg Oral BID WC Crosley, Debby, MD   12.5 mg at 11/15/22 1905   Chlorhexidine Gluconate Cloth 2 % PADS 6 each  6 each Topical Q0600 Delano Metz, MD   6  each at 11/16/22 0536   fluticasone (FLONASE) 50 MCG/ACT nasal spray 2 spray  2 spray Each Nare Daily PRN Hongalgi, Maximino Greenland, MD       fluticasone furoate-vilanterol (BREO ELLIPTA) 200-25 MCG/ACT 1 puff  1 puff Inhalation Daily Crosley, Debby, MD   1 puff at 11/15/22 0926   [START ON 11/17/2022] heparin injection 3,000 Units  3,000 Units Dialysis Once in dialysis Delano Metz, MD       heparin injection 5,000 Units  5,000 Units Subcutaneous Q8H Crosley, Debby, MD   5,000 Units at 11/16/22 0531   heparin sodium (porcine) injection 3,000 Units  3,000 Units Intravenous Once Delano Metz, MD       HYDROmorphone (DILAUDID) injection 2 mg  2 mg Intravenous Q4H PRN Nadara Mustard, MD   2 mg at 11/16/22 0534   hydrOXYzine (ATARAX) tablet 25 mg  25 mg Oral BID Marcellus Scott D, MD   25 mg at 11/15/22 2204   insulin aspart (novoLOG) injection 0-15 Units  0-15 Units Subcutaneous TID WC Crosley, Debby, MD   3 Units at 11/15/22 1901   insulin aspart protamine- aspart (NOVOLOG MIX 70/30) injection 30 Units  30 Units Subcutaneous BID WC Gery Pray, MD   30 Units at 11/15/22 1908  insulin glargine-yfgn (SEMGLEE) injection 15 Units  15 Units Subcutaneous QHS Gery Pray, MD   15 Units at 11/15/22 2251   methocarbamol (ROBAXIN) tablet 500 mg  500 mg Oral BID Marcellus Scott D, MD   500 mg at 11/15/22 2204   [START ON 11/17/2022] midodrine (PROAMATINE) tablet 10 mg  10 mg Oral Q T,Th,Sa-HD Crosley, Debby, MD       nitroGLYCERIN (NITRODUR - Dosed in mg/24 hr) patch 0.2 mg  0.2 mg Transdermal Daily Nadara Mustard, MD   0.2 mg at 11/15/22 1313   nitroGLYCERIN (NITROSTAT) SL tablet 0.4 mg  0.4 mg Sublingual Q5 Min x 3 PRN Hongalgi, Anand D, MD       oxyCODONE (Oxy IR/ROXICODONE) immediate release tablet 10 mg  10 mg Oral QID Marcellus Scott D, MD   10 mg at 11/15/22 2204   pantoprazole (PROTONIX) EC tablet 40 mg  40 mg Oral QAC breakfast Hongalgi, Theadora Rama D, MD       piperacillin-tazobactam (ZOSYN) IVPB 2.25 g   2.25 g Intravenous Q8H Crosley, Debby, MD 100 mL/hr at 11/16/22 0532 2.25 g at 11/16/22 0532   pregabalin (LYRICA) capsule 100 mg  100 mg Oral BID Crosley, Debby, MD   100 mg at 11/15/22 2204   saccharomyces boulardii (FLORASTOR) capsule 250 mg  250 mg Oral BID Gery Pray, MD   250 mg at 11/15/22 2204   sucroferric oxyhydroxide (VELPHORO) chewable tablet 1,500 mg  1,500 mg Oral TID Marcellus Scott D, MD   1,500 mg at 11/15/22 2213   torsemide (DEMADEX) tablet 10 mg  10 mg Oral Q48H Crosley, Debby, MD       valACYclovir (VALTREX) tablet 500 mg  500 mg Oral QODAY Hongalgi, Theadora Rama D, MD   500 mg at 11/15/22 1619   vancomycin (VANCOCIN) capsule 125 mg  125 mg Oral QID Gery Pray, MD   125 mg at 11/15/22 2204   [START ON 11/17/2022] vancomycin (VANCOREADY) IVPB 750 mg/150 mL  750 mg Intravenous Q T,Th,Sa-HD Crosley, Debby, MD       vancomycin (VANCOREADY) IVPB 750 mg/150 mL  750 mg Intravenous Once Reome, Earle J, RPH          Review of Systems: 10 systems reviewed and negative except per interval history/subjective  Physical Exam: Vitals:   11/16/22 1100 11/16/22 1130  BP: 105/66 101/86  Pulse: 76 73  Resp: 14 17  Temp:    SpO2: 99% 98%   No intake/output data recorded.  Intake/Output Summary (Last 24 hours) at 11/16/2022 1157 Last data filed at 11/16/2022 0600 Gross per 24 hour  Intake 250 ml  Output --  Net 250 ml   Constitutional: well-appearing, no acute distress ENMT: ears and nose without scars or lesions, MMM CV: normal rate, 1+ edema at the bilateral ankles Respiratory: clear to auscultation, normal work of breathing Gastrointestinal: soft, non-tender, no palpable masses or hernias Skin: Some swelling of the left foot and scar present, otherwise no visible lesions or rashes Psych: alert, judgement/insight appropriate, appropriate mood and affect   Test Results I personally reviewed new and old clinical labs and radiology tests Lab Results  Component Value Date   NA  136 11/16/2022   K 5.8 (H) 11/16/2022   CL 93 (L) 11/16/2022   CO2 24 11/16/2022   BUN 58 (H) 11/16/2022   CREATININE 10.48 (H) 11/16/2022   GFR 56.38 (L) 10/11/2014   CALCIUM 9.0 11/16/2022   ALBUMIN 3.0 (L) 11/16/2022   PHOS 3.0 11/01/2022  CBC Recent Labs  Lab 11/14/22 2257 11/15/22 0357 11/16/22 0409  WBC 6.0 6.4 6.5  NEUTROABS  --  4.3  --   HGB 11.2* 10.6* 10.5*  HCT 37.6 33.8* 34.5*  MCV 92.6 89.9 93.0  PLT 154 146* 185

## 2022-11-16 NOTE — Evaluation (Signed)
Physical Therapy Evaluation Patient Details Name: [name] MRN: [id number] DOB: October 20, 1955 Today's Date: 11/16/2022  History of Present Illness  67 y.o. female presents to [facility] hospital on 11/14/2022 with L foot surgical site opening and drainage. Pt recently underwent L 1st [place] amputation on 4/17. PMH: ESRD on HD TTS, T2DM, HTN, CHF, CAD s/p PCI and stent, hepatitis C.  Clinical Impression  Pt presents to PT mobilizing well despite 1st [place] wound and pain. Pt requires assistance to transfer from low hospital bed, but her personal bed is taller and she is able to transfer from recliner without physical assistance at this time. Pt reports much improved comfort with CAM boot on, therapies request family bring in a shoe to mitigate leg length difference due to CAM boot. Pt will benefit from continued functional mobility and gait training in an effort to improve stability and activity tolerance. Pt expresses the desire to return home. PT recommends therapies in the home setting along with intermittent assistance from family.       Recommendations for follow up therapy are one component of a multi-disciplinary discharge planning process, led by the attending physician.  Recommendations may be updated based on patient status, additional functional criteria and insurance authorization.  Follow Up Recommendations       Assistance Recommended at Discharge Intermittent Supervision/Assistance  Patient can return home with the following  A little help with walking and/or transfers;A little help with bathing/dressing/bathroom;Assistance with cooking/housework;Assist for transportation;Help with stairs or ramp for entrance    Equipment Recommendations BSC/3in1  Recommendations for Other Services       Functional Status Assessment Patient has had a recent decline in their functional status and demonstrates the ability to make significant improvements in function in a reasonable and predictable amount of  time.     Precautions / Restrictions Precautions Precautions: Fall Required Braces or Orthoses: Other Brace Other Brace: CAM boot Restrictions Weight Bearing Restrictions: Yes LLE Weight Bearing: Weight bearing as tolerated Other Position/Activity Restrictions: WBAT in CAM      Mobility  Bed Mobility Overal bed mobility: Modified Independent Bed Mobility: Supine to Sit     Supine to sit: Modified independent (Device/Increase time), HOB elevated     General bed mobility comments: increased time, use of rail    Transfers Overall transfer level: Needs assistance Equipment used: Rolling walker (2 wheels) Transfers: Sit to/from Stand Sit to Stand: Mod assist, Supervision, From elevated surface           General transfer comment: modA from low bed, minG progressing to supervision from recliner with armrests    Ambulation/Gait Ambulation/Gait assistance: Min guard Gait Distance (Feet): 25 Feet Assistive device: Rolling walker (2 wheels) Gait Pattern/deviations: Step-to pattern Gait velocity: reduced Gait velocity interpretation: <1.8 ft/sec, indicate of risk for recurrent falls   General Gait Details: slowed step-to gait, leg length difference with CAM boot on LLE and slipper on RLE  Stairs            Wheelchair Mobility    Modified Rankin (Stroke Patients Only)       Balance Overall balance assessment: Needs assistance Sitting-balance support: No upper extremity supported, Feet supported Sitting balance-Leahy Scale: Good     Standing balance support: Bilateral upper extremity supported, Reliant on assistive device for balance Standing balance-Leahy Scale: Poor                               Pertinent Vitals/Pain  Pain Assessment Pain Assessment: 0-10 Pain Score: 10-Worst pain ever Pain Location: L foot Pain Descriptors / Indicators: Aching Pain Intervention(s): Monitored during session    Home Living Family/patient expects to be  discharged to:: Private residence Living Arrangements: Children Available Help at Discharge: Family;Available PRN/intermittently Type of Home: Apartment Home Access: Level entry       Home Layout: One level Home Equipment: Agricultural consultant (2 wheels);Rollator (4 wheels) (pt is unsure if she has a BSC)      Prior Function Prior Level of Function : Needs assist             Mobility Comments: ambulatory with SPC vs RW ADLs Comments: intermittent assistance for bathing     Hand Dominance   Dominant Hand: Right    Extremity/Trunk Assessment   Upper Extremity Assessment Upper Extremity Assessment: Overall WFL for tasks assessed    Lower Extremity Assessment Lower Extremity Assessment: LLE deficits/detail LLE Deficits / Details: L foot wound at 1st [place], edema noted in foot, otherwise LLE WFL    Cervical / Trunk Assessment Cervical / Trunk Assessment: Other exceptions Cervical / Trunk Exceptions: Forward head posture with rounded shoulders  Communication   Communication: No difficulties  Cognition Arousal/Alertness: Awake/alert Behavior During Therapy: WFL for tasks assessed/performed Overall Cognitive Status: Within Functional Limits for tasks assessed                                          General Comments General comments (skin integrity, edema, etc.): VSS on RA, miniscule amount of drainage noted at wound site of L 1st [place]    Exercises     Assessment/Plan    PT Assessment Patient needs continued PT services  PT Problem List Decreased strength;Decreased activity tolerance;Decreased balance;Decreased mobility;Decreased knowledge of use of DME;Decreased safety awareness;Decreased knowledge of precautions;Pain       PT Treatment Interventions DME instruction;Gait training;Functional mobility training;Therapeutic activities;Therapeutic exercise;Balance training;Neuromuscular re-education;Patient/family education    PT Goals (Current goals can be  found in the Care Plan section)  Acute Rehab PT Goals Patient Stated Goal: to return to independence PT Goal Formulation: With patient Time For Goal Achievement: 11/30/22 Potential to Achieve Goals: Fair    Frequency Min 3X/week     Co-evaluation               AM-PAC PT "6 Clicks" Mobility  Outcome Measure Help needed turning from your back to your side while in a flat bed without using bedrails?: None Help needed moving from lying on your back to sitting on the side of a flat bed without using bedrails?: None Help needed moving to and from a bed to a chair (including a wheelchair)?: A Little Help needed standing up from a chair using your arms (e.g., wheelchair or bedside chair)?: A Little Help needed to walk in hospital room?: A Little Help needed climbing 3-5 steps with a railing? : Total 6 Click Score: 18    End of Session Equipment Utilized During Treatment: Gait belt Activity Tolerance: Patient tolerated treatment well Patient left: in chair;Other (comment);with call bell/phone within reach (on [facility], nurse tech aware) Nurse Communication: Mobility status PT Visit Diagnosis: Unsteadiness on feet (R26.81);Pain Pain - Right/Left: Left Pain - part of body: Ankle and joints of foot    Time: 1191-4782 PT Time Calculation (min) (ACUTE ONLY): 30 min   Charges:   PT Evaluation $PT Eval Low  Complexity: 1 Low          Arlyss Gandy, PT, DPT Acute Rehabilitation Office [phone]   Arlyss Gandy 11/16/2022, 5:51 PM

## 2022-11-16 NOTE — Procedures (Signed)
I was present at this dialysis session. I have reviewed the session itself and made appropriate changes.   There were no vitals filed for this visit.  Recent Labs  Lab 11/16/22 0409  NA 136  K 5.8*  CL 93*  CO2 24  GLUCOSE 137*  BUN 58*  CREATININE 10.48*  CALCIUM 9.0    Recent Labs  Lab 11/14/22 2257 11/15/22 0357 11/16/22 0409  WBC 6.0 6.4 6.5  NEUTROABS  --  4.3  --   HGB 11.2* 10.6* 10.5*  HCT 37.6 33.8* 34.5*  MCV 92.6 89.9 93.0  PLT 154 146* 185    Scheduled Meds:  aspirin EC  81 mg Oral Daily   atorvastatin  80 mg Oral QPM   carvedilol  12.5 mg Oral BID WC   Chlorhexidine Gluconate Cloth  6 each Topical Q0600   fluticasone furoate-vilanterol  1 puff Inhalation Daily   [START ON 11/17/2022] heparin  3,000 Units Dialysis Once in dialysis   heparin  5,000 Units Subcutaneous Q8H   heparin sodium (porcine)  3,000 Units Intravenous Once   hydrOXYzine  25 mg Oral BID   insulin aspart  0-15 Units Subcutaneous TID WC   insulin aspart protamine- aspart  30 Units Subcutaneous BID WC   insulin glargine-yfgn  15 Units Subcutaneous QHS   methocarbamol  500 mg Oral BID   [START ON 11/17/2022] midodrine  10 mg Oral Q T,Th,Sa-HD   nitroGLYCERIN  0.2 mg Transdermal Daily   oxyCODONE  10 mg Oral QID   pantoprazole  40 mg Oral QAC breakfast   pregabalin  100 mg Oral BID   saccharomyces boulardii  250 mg Oral BID   sucroferric oxyhydroxide  1,500 mg Oral TID   torsemide  10 mg Oral Q48H   valACYclovir  500 mg Oral QODAY   vancomycin  125 mg Oral QID   Continuous Infusions:  piperacillin-tazobactam (ZOSYN)  IV 2.25 g (11/16/22 0532)   [START ON 11/17/2022] vancomycin     vancomycin     PRN Meds:.acetaminophen **OR** acetaminophen, fluticasone, HYDROmorphone (DILAUDID) injection, nitroGLYCERIN   Louie Bun,  MD 11/16/2022, 11:59 AM

## 2022-11-16 NOTE — Evaluation (Signed)
Occupational Therapy Evaluation Patient Details Name: [name] MRN: [id number] DOB: 03-27-1956 Today's Date: 11/16/2022   History of Present Illness 67 y.o. female presents to [facility] hospital on 11/14/2022 with L foot surgical site opening and drainage. Pt recently underwent L 1st [place] amputation on 4/17. PMH: ESRD on HD TTS, T2DM, HTN, CHF, CAD s/p PCI and stent, hepatitis C.   Clinical Impression   PTA pt living at home and had assistance of family for mobility and self care.  Pt mobilized well using the CAM boot and states the boot makes her foot "feel so much better". Able to complete short distance functional mobility @ RW level with S and min A with LB ADL tasks. Pt states her children can assist at DC. Recommend HHOT at DC. Recommend pt mobilize with staff.      Recommendations for follow up therapy are one component of a multi-disciplinary discharge planning process, led by the attending physician.  Recommendations may be updated based on patient status, additional functional criteria and insurance authorization.   Assistance Recommended at Discharge Frequent or constant Supervision/Assistance  Patient can return home with the following A little help with walking and/or transfers;A little help with bathing/dressing/bathroom;Assistance with cooking/housework;Help with stairs or ramp for entrance    Functional Status Assessment  Patient has had a recent decline in their functional status and demonstrates the ability to make significant improvements in function in a reasonable and predictable amount of time.  Equipment Recommendations  None recommended by OT    Recommendations for Other Services       Precautions / Restrictions Precautions Precautions: Fall Required Braces or Orthoses: Other Brace Other Brace: CAM boot Restrictions Weight Bearing Restrictions: Yes LLE Weight Bearing: Weight bearing as tolerated Other Position/Activity Restrictions: WBAT in CAM      Mobility  Bed Mobility Overal bed mobility: Needs Assistance Bed Mobility: Supine to Sit     Supine to sit: Modified independent (Device/Increase time)          Transfers Overall transfer level: Needs assistance Equipment used: Rolling walker (2 wheels) Transfers: Sit to/from Stand Sit to Stand: Mod assist, Supervision, From elevated surface           General transfer comment: modA from low bed, minG progressing to supervision from recliner with armrests      Balance Overall balance assessment: Needs assistance Sitting-balance support: No upper extremity supported, Feet supported Sitting balance-Leahy Scale: Good     Standing balance support: Bilateral upper extremity supported, Reliant on assistive device for balance Standing balance-Leahy Scale: Poor                             ADL either performed or assessed with clinical judgement   ADL       Grooming: Set up;Sitting   Upper Body Bathing: Set up;Sitting   Lower Body Bathing: Minimal assistance;Sit to/from stand   Upper Body Dressing : Set up;Sitting   Lower Body Dressing: Moderate assistance (to donn CAM boot)   Toilet Transfer: Min guard;BSC/3in1;Rolling walker (2 wheels);Stand-pivot           Functional mobility during ADLs: Minimal assistance;Rollator (4 wheels) General ADL Comments: Able to release RW for simulated peri care     Vision         Perception     Praxis      Pertinent Vitals/Pain Pain Assessment Pain Assessment: 0-10 Pain Score: 10-Worst pain ever Pain Location: L foot Pain  Descriptors / Indicators: Aching Pain Intervention(s): Limited activity within patient's tolerance     Hand Dominance Right   Extremity/Trunk Assessment Upper Extremity Assessment Upper Extremity Assessment: Overall WFL for tasks assessed   Lower Extremity Assessment Lower Extremity Assessment: Defer to PT evaluation LLE Deficits / Details: L foot wound at 1st [place], edema noted in foot,  otherwise LLE WFL   Cervical / Trunk Assessment Cervical / Trunk Assessment: Other exceptions Cervical / Trunk Exceptions: Forward head posture with rounded shoulders   Communication Communication Communication: No difficulties   Cognition Arousal/Alertness: Awake/alert Behavior During Therapy: WFL for tasks assessed/performed Overall Cognitive Status: Within Functional Limits for tasks assessed                                       General Comments  VSS on RA, miniscule amount of drainage noted at wound site of L 1st [place]    Exercises     Shoulder Instructions      Home Living Family/patient expects to be discharged to:: Private residence Living Arrangements: Children Available Help at Discharge: Family;Available PRN/intermittently Type of Home: Apartment Home Access: Level entry     Home Layout: One level     Bathroom Shower/Tub: Chief Strategy Officer: Standard Bathroom Accessibility: Yes How Accessible: Accessible via walker Home Equipment: Rolling Walker (2 wheels);Rollator (4 wheels);BSC/3in1 (pt is unsure if she has a BSC)          Prior Functioning/Environment Prior Level of Function : Needs assist             Mobility Comments: ambulatory with SPC vs RW ADLs Comments: intermittent assistance for bathing        OT Problem List: Decreased strength;Impaired balance (sitting and/or standing);Decreased knowledge of use of DME or AE;Obesity;Pain      OT Treatment/Interventions: Self-care/ADL training;Therapeutic exercise;DME and/or AE instruction;Therapeutic activities;Patient/family education    OT Goals(Current goals can be found in the care plan section) Acute Rehab OT Goals Patient Stated Goal: to get better OT Goal Formulation: With patient Time For Goal Achievement: 11/30/22 Potential to Achieve Goals: Good  OT Frequency: Min 2X/week    Co-evaluation PT/OT/SLP Co-Evaluation/Treatment: Yes Reason for  Co-Treatment: To address functional/ADL transfers   OT goals addressed during session: ADL's and self-care      AM-PAC OT "6 Clicks" Daily Activity     Outcome Measure Help from another person eating meals?: None Help from another person taking care of personal grooming?: A Little Help from another person toileting, which includes using toliet, bedpan, or urinal?: A Little Help from another person bathing (including washing, rinsing, drying)?: A Little Help from another person to put on and taking off regular upper body clothing?: A Little Help from another person to put on and taking off regular lower body clothing?: A Lot 6 Click Score: 18   End of Session Equipment Utilized During Treatment: Rolling walker (2 wheels) Nurse Communication: Mobility status;Weight bearing status;Other (comment) (WBAT in CAM boot)  Activity Tolerance: Patient tolerated treatment well Patient left: Other (comment) (on BSC - nsg made awre)  OT Visit Diagnosis: Unsteadiness on feet (R26.81);Muscle weakness (generalized) (M62.81);Pain Pain - Right/Left: Left Pain - part of body: Ankle and joints of foot                Time: 9147-8295 OT Time Calculation (min): 30 min Charges:  OT General Charges $OT Visit: 1 Visit  OT Evaluation $OT Eval Moderate Complexity: 1 Mod  Dasie Chancellor, OT/L   Acute OT Clinical Specialist Acute Rehabilitation Services Pager [phone] Office [phone]   [facility] 11/16/2022, 6:38 PM

## 2022-11-16 NOTE — Plan of Care (Signed)
Dilaudid 2mg  effective and only given 1 x this shift. Pt rest well during overnight. Vitals stable.  Problem: Education: Goal: Knowledge of General Education information will improve Description: Including pain rating scale, medication(s)/side effects and non-pharmacologic comfort measures Outcome: Progressing   Problem: Health Behavior/Discharge Planning: Goal: Ability to manage health-related needs will improve Outcome: Progressing   Problem: Clinical Measurements: Goal: Ability to maintain clinical measurements within normal limits will improve Outcome: Progressing Goal: Will remain free from infection Outcome: Progressing Goal: Diagnostic test results will improve Outcome: Progressing Goal: Respiratory complications will improve Outcome: Progressing Goal: Cardiovascular complication will be avoided Outcome: Progressing   Problem: Activity: Goal: Risk for activity intolerance will decrease Outcome: Progressing   Problem: Nutrition: Goal: Adequate nutrition will be maintained Outcome: Progressing   Problem: Coping: Goal: Level of anxiety will decrease Outcome: Progressing   Problem: Elimination: Goal: Will not experience complications related to bowel motility Outcome: Progressing Goal: Will not experience complications related to urinary retention Outcome: Progressing   Problem: Pain Managment: Goal: General experience of comfort will improve Outcome: Progressing   Problem: Safety: Goal: Ability to remain free from injury will improve Outcome: Progressing   Problem: Skin Integrity: Goal: Risk for impaired skin integrity will decrease Outcome: Progressing   Problem: Education: Goal: Ability to describe self-care measures that may prevent or decrease complications (Diabetes Survival Skills Education) will improve Outcome: Progressing Goal: Individualized Educational Video(s) Outcome: Progressing   Problem: Coping: Goal: Ability to adjust to condition or  change in health will improve Outcome: Progressing   Problem: Fluid Volume: Goal: Ability to maintain a balanced intake and output will improve Outcome: Progressing   Problem: Health Behavior/Discharge Planning: Goal: Ability to identify and utilize available resources and services will improve Outcome: Progressing Goal: Ability to manage health-related needs will improve Outcome: Progressing   Problem: Metabolic: Goal: Ability to maintain appropriate glucose levels will improve Outcome: Progressing   Problem: Nutritional: Goal: Maintenance of adequate nutrition will improve Outcome: Progressing Goal: Progress toward achieving an optimal weight will improve Outcome: Progressing   Problem: Skin Integrity: Goal: Risk for impaired skin integrity will decrease Outcome: Progressing   Problem: Tissue Perfusion: Goal: Adequacy of tissue perfusion will improve Outcome: Progressing

## 2022-11-17 DIAGNOSIS — L089 Local infection of the skin and subcutaneous tissue, unspecified: Secondary | ICD-10-CM | POA: Diagnosis not present

## 2022-11-17 DIAGNOSIS — E11628 Type 2 diabetes mellitus with other skin complications: Secondary | ICD-10-CM | POA: Diagnosis not present

## 2022-11-17 LAB — BASIC METABOLIC PANEL
Anion gap: 14 (ref 5–15)
BUN: 32 mg/dL — ABNORMAL HIGH (ref 8–23)
CO2: 25 mmol/L (ref 22–32)
Calcium: 8.9 mg/dL (ref 8.9–10.3)
Chloride: 93 mmol/L — ABNORMAL LOW (ref 98–111)
Creatinine, Ser: 7.13 mg/dL — ABNORMAL HIGH (ref 0.44–1.00)
GFR, Estimated: 6 mL/min — ABNORMAL LOW (ref >60)
Glucose, Bld: 198 mg/dL — ABNORMAL HIGH (ref 70–99)
Potassium: 4.7 mmol/L (ref 3.5–5.1)
Sodium: 132 mmol/L — ABNORMAL LOW (ref 135–145)

## 2022-11-17 LAB — CBC
HCT: 32.6 % — ABNORMAL LOW (ref 36.0–46.0)
Hemoglobin: 10.3 g/dL — ABNORMAL LOW (ref 12.0–15.0)
MCH: 28.4 pg (ref 26.0–34.0)
MCHC: 31.6 g/dL (ref 30.0–36.0)
MCV: 89.8 fL (ref 80.0–100.0)
Platelets: 168 10*3/uL (ref 150–400)
RBC: 3.63 MIL/uL — ABNORMAL LOW (ref 3.87–5.11)
RDW: 15.8 % — ABNORMAL HIGH (ref 11.5–15.5)
WBC: 7 10*3/uL (ref 4.0–10.5)
nRBC: 0 % (ref 0.0–0.2)

## 2022-11-17 LAB — GLUCOSE, CAPILLARY
Glucose-Capillary: 169 mg/dL — ABNORMAL HIGH (ref 70–99)
Glucose-Capillary: 124 mg/dL — ABNORMAL HIGH (ref 70–99)
Glucose-Capillary: 217 mg/dL — ABNORMAL HIGH (ref 70–99)

## 2022-11-17 LAB — CULTURE, BLOOD (ROUTINE X 2): Culture: NO GROWTH

## 2022-11-17 LAB — HEPATITIS B SURFACE ANTIBODY, QUANTITATIVE: Hep B S AB Quant (Post): 5.4 m[IU]/mL — ABNORMAL LOW (ref >9.9)

## 2022-11-17 MED ORDER — DOXYCYCLINE HYCLATE 100 MG PO TABS: 100.0000 mg | ORAL_TABLET | Freq: Two times a day (BID) | ORAL | 0 refills | Status: AC

## 2022-11-17 MED ORDER — HEPARIN SODIUM (PORCINE) 1000 UNIT/ML IJ SOLN
INTRAMUSCULAR | Status: AC
  Administered 2022-11-17: 3 [IU]
  Filled 2022-11-17: qty 3

## 2022-11-17 MED ORDER — VANCOMYCIN HCL 125 MG PO CAPS: 125.0000 mg | ORAL_CAPSULE | Freq: Four times a day (QID) | ORAL | 0 refills | Status: AC

## 2022-11-17 MED ORDER — HYDROXYZINE HCL 25 MG PO TABS: 25.0000 mg | ORAL_TABLET | Freq: Two times a day (BID) | ORAL | Status: DC

## 2022-11-17 MED ORDER — NITROGLYCERIN 0.2 MG/HR TD PT24: 0.2000 mg | MEDICATED_PATCH | Freq: Every day | TRANSDERMAL | 11 refills | Status: DC

## 2022-11-17 MED ORDER — NITROGLYCERIN 0.2 MG/HR TD PT24: 0.2000 mg | MEDICATED_PATCH | Freq: Every day | TRANSDERMAL | 0 refills | Status: DC

## 2022-11-17 NOTE — Discharge Instructions (Signed)

## 2022-11-17 NOTE — Discharge Summary (Signed)
Physician Discharge Summary  [name] ZOX:[id number] DOB: 06-20-1956  PCP: Felix Pacini, FNP  Admitted from: Home Discharged to: Home  Admit date: 11/14/2022 Discharge date: 11/17/2022  Recommendations for Outpatient Follow-up:    Follow-up Information     Nadara Mustard, MD Follow up in 1 week(s).   Specialty: Orthopedic Surgery Contact information: [address] [place] [address] [phone]         Felix Pacini, FNP. Schedule an appointment as soon as possible for a visit in 1 week(s).   Specialty: Endocrinology Why: To be seen with repeat labs (CBC & BMP).  Labs may have been drawn at the dialysis center and can be reviewed with those. Contact information: [address] [place] [address] [phone]         Hemodialysis center Follow up on 11/19/2022.   Why: Continue your scheduled dialysis on Tuesdays, Thursdays and Saturdays.                 Home Health: Home Health Orders (From admission, onward)     Start     Ordered   11/17/22 1317  Home Health  At discharge       Question Answer Comment  To provide the following care/treatments PT   To provide the following care/treatments RN   To provide the following care/treatments OT      11/17/22 1319             Equipment/Devices:     Durable Medical Equipment  (From admission, onward)           Start     Ordered   11/17/22 1319  DME 3-in-1  Once        11/17/22 1319             Discharge Condition: Improved and stable   Code Status: Full Code Diet recommendation:  Discharge Diet Orders (From admission, onward)     Start     Ordered   11/17/22 0000  Diet - low sodium heart healthy        11/17/22 1319   11/17/22 0000  Diet Carb Modified        11/17/22 1319             Discharge Diagnoses:  Principal Problem:   Diabetic foot infection (HCC) Active Problems:   COPD (chronic obstructive pulmonary disease) (HCC)   Chronic hepatitis C  without hepatic coma (HCC)   Depression   CAD (coronary artery disease)   Chronic diastolic heart failure (HCC)   ESRD (end stage renal disease) (HCC)   Type 2 diabetes mellitus with diabetic foot infection (HCC)   Brief Summary: 67 year old female with medical history significant for ESRD on TTS HD, type II/IDDM, HLD, CAD, HTN, GERD, hepatitis C and depression, recent hospitalization 10/20/2022 - 11/01/2022 for acute metabolic encephalopathy, osteomyelitis of the left foot and C. difficile pancolitis.  During that admission, she underwent left foot first Nap[place]ala amputation by Dr. Lajoyce Corners on 4/17, complete course of IV vancomycin and ceftriaxone and was discharged home with wound VAC x 1 week with outpatient follow-up in Ortho office.  She presented back to the ED on 11/14/2022 with complaints of left foot surgical site sutures opening up, drainage from surgical site, swelling and pain of left foot with inability to weight-bear or put on her postop shoe.  Admitted for possible left postop diabetic foot infection.  Orthopedics/Dr. Lajoyce Corners and nephrology consulted.     Assessment & Plan:  Left postop diabetic foot infection: History as noted above.  Orthopedics/Dr. Lajoyce Corners consulted. Indicates that she has some mild ischemic changes of the second toe and has developed an area of skin ischemic breakdown over the dorsum of the foot from using her postop shoe.  He recommends NTG patch, CAM boot & WBAT.  In the hospital she was treated with IV antibiotics including vancomycin and Zosyn x 3 days.  Dr. Lajoyce Corners has seen again today I discussed with him.  He is satisfied with patient's progress.  He recommends oral doxycycline x 2 weeks, NTG patch over left ankle, weightbearing as tolerated and will follow-up in the office in 1 week.  Therapies have seen and recommend home health PT and OT.  Pain appears to be reasonably controlled.   Recent C. difficile pancolitis: Completed oral vancomycin course on day of hospital  admission.  Diarrhea significantly improved.  However given that patient is back on antibiotics and risk of recurrence, will extend oral vancomycin until completion of her course of doxycycline.   ESRD on TTS HD/hyperkalemia: Missed HD on 5/4.  Nephrology was consulted.  She underwent HD on 5/6 and 5/7.  She is now back on her TTS schedule.  Updated nephrology that patient will be discharged today.  She can continue her scheduled outpatient HD.  She did get 2 doses of Lokelma for hyperkalemia prior to her dialysis.   COPD: Stable without clinical exacerbation.  As needed bronchodilators.  Continue Breo Ellipta.   Essential hypertension:  Controlled.  Continue carvedilol 12.5 Mg twice daily.   Dyslipidemia: Continue atorvastatin.   Type II DM with renal complications, with long-term use of insulins: A1c 8.8 on 07/23/2022. Patient appears to be on a unusual insulin regimen at home including Humulin 70/30, 30 units twice daily and Levemir 30 units twice daily.  Unclear why she has not been consolidated into a single form of insulin.  Currently on home dose of 70/30 insulin and reduced dose of Levemir 15 units at bedtime.  Also on SSI.  Adjust insulins as needed.  Continue Lyrica for peripheral neuropathy. Consulted diabetes coordinator for assistance.   DM coordinator note from yesterday appreciated.  She called PCPs office and confirmed that patient is supposed to be taking Levemir 60 units daily and Humulin 70/30 insulin 30 units twice daily.  In the hospital she was on reduced dose of Levemir with mildly uncontrolled and fluctuating CBGs but overall reasonably controlled.  Continue prior home doses at discharge with close outpatient follow-up with PCP.   CAD No anginal symptoms.  Continue beta-blockers and statins.   Chronic diastolic CHF: Volume management across HD.  Clinically euvolemic.   PAD Appears that local ischemic changes of left foot are due to pressure injury as per orthopedics.   Resume aspirin, continue statins.   GERD Continue statins.   Anemia: Appears new compared to recent discharge when hemoglobin was in the 12 g range.  No overt bleeding.  May be multifactorial due to infection, ESRD etc.  Follow CBC periodically across dialysis and transfuse if hemoglobin 7 g or less.  Stable in the 10 g range.   Thrombocytopenia: Chronic and intermittent.  Appears to have resolved at this time  Abnormal LFTs: Mild transaminitis, stable.  Mildly elevated alkaline phosphatase.  Unclear etiology.  Close outpatient follow-up.   Body mass index is 40.17 kg/m./Morbid obesity   Consultants:   Orthopedics/Dr. Lajoyce Corners Nephrology   Procedures:   HD.   Discharge Instructions  Discharge Instructions  Call MD for:  difficulty breathing, headache or visual disturbances   Complete by: As directed    Call MD for:  extreme fatigue   Complete by: As directed    Call MD for:  persistant dizziness or light-headedness   Complete by: As directed    Call MD for:  persistant nausea and vomiting   Complete by: As directed    Call MD for:  redness, tenderness, or signs of infection (pain, swelling, redness, odor or green/yellow discharge around incision site)   Complete by: As directed    Call MD for:  severe uncontrolled pain   Complete by: As directed    Call MD for:  temperature >100.4   Complete by: As directed    Diet - low sodium heart healthy   Complete by: As directed    Diet Carb Modified   Complete by: As directed    Increase activity slowly   Complete by: As directed    No wound care   Complete by: As directed         Medication List     TAKE these medications    albuterol 108 (90 Base) MCG/ACT inhaler Commonly known as: VENTOLIN HFA Inhale 2 puffs into the lungs every 4 (four) hours as needed for wheezing or shortness of breath.   aspirin EC 81 MG tablet Take 1 tablet (81 mg total) by mouth daily.   atorvastatin 80 MG tablet Commonly known as:  LIPITOR Take 1 tablet (80 mg total) by mouth every evening. What changed: when to take this   Blood Glucose Test Strips 333 Strp Enough test stripes to test blood sugar TID x 30 days. No refills   carvedilol 12.5 MG tablet Commonly known as: COREG Take 1 tablet (12.5 mg total) by mouth 2 (two) times daily with a meal.   doxycycline 100 MG tablet Commonly known as: VIBRA-TABS Take 1 tablet (100 mg total) by mouth 2 (two) times daily for 14 days.   fluticasone 50 MCG/ACT nasal spray Commonly known as: FLONASE Place 2 sprays into both nostrils daily as needed for allergies or rhinitis.   fluticasone furoate-vilanterol 200-25 MCG/INH Aepb Commonly known as: BREO ELLIPTA Inhale 1 puff into the lungs daily.   FreeStyle Libre 14 Day Reader Hardie Pulley To check blood sugar ACHS   FreeStyle Libre 14 Day Sensor Misc To check blood sugar ACHS; change every 14 days   HumuLIN 70/30 KwikPen (70-30) 100 UNIT/ML KwikPen Generic drug: insulin isophane & regular human KwikPen Inject 30 Units into the skin 2 (two) times daily.   hydrOXYzine 25 MG tablet Commonly known as: ATARAX Take 1 tablet (25 mg total) by mouth 2 (two) times daily.   Insulin Pen Needle 32G X 4 MM Misc Enough pen needles for 100 units of novolin QAM x 30 days. No refills   Levemir FlexPen 100 UNIT/ML FlexPen Generic drug: insulin detemir Inject 25 Units into the skin daily.   methocarbamol 500 MG tablet Commonly known as: ROBAXIN Take 1 tablet (500 mg total) by mouth 2 (two) times daily.   midodrine 10 MG tablet Commonly known as: PROAMATINE Take 10 mg by mouth See admin instructions. Take 10 mg by mouth 30 minutes before dialysis on Tues/Thurs/Sat   naloxone 4 MG/0.1ML Liqd nasal spray kit Commonly known as: NARCAN Place 1 spray into the nose as directed.   nitroGLYCERIN 0.4 MG SL tablet Commonly known as: NITROSTAT Place 1 tablet (0.4 mg total) under the tongue every 5 (five) minutes x  3 doses as needed for chest  pain. What changed: Another medication with the same name was added. Make sure you understand how and when to take each.   nitroGLYCERIN 0.2 mg/hr patch Commonly known as: NITRODUR - Dosed in mg/24 hr Place 1 patch (0.2 mg total) onto the skin daily. Apply a patch to the dorsum of the left ankle.  Change location daily. What changed: You were already taking a medication with the same name, and this prescription was added. Make sure you understand how and when to take each.   Oxycodone HCl 10 MG Tabs Take 10 mg by mouth 4 (four) times daily.   pantoprazole 40 MG tablet Commonly known as: PROTONIX Take 40 mg by mouth daily before breakfast.   pregabalin 100 MG capsule Commonly known as: LYRICA Take 1 capsule (100 mg total) by mouth 2 (two) times daily.   torsemide 10 MG tablet Commonly known as: DEMADEX Take 10 mg by mouth See admin instructions. Take 10 mg by mouth every other morning   valACYclovir 500 MG tablet Commonly known as: VALTREX TAKE (1) TABLET BY MOUTH EVERY OTHER DAY. What changed: See the new instructions.   vancomycin 125 MG capsule Commonly known as: VANCOCIN Take 1 capsule (125 mg total) by mouth 4 (four) times daily for 14 days.   Velphoro 500 MG chewable tablet Generic drug: sucroferric oxyhydroxide Chew 1,500 mg by mouth 3 (three) times daily.       Allergies  Allergen Reactions   Shellfish Allergy Anaphylaxis and Swelling   Diazepam Other (See Comments)    "felt like out of body experience"   Morphine Itching and Other (See Comments)    Tolerated hydromorphone on 07/2020 *will take along with Benadryl*      Procedures/Studies:   Subjective: Seen at HD this morning.  Denied complaints.  Specifically did not report pain.  Discharge Exam:  Vitals:   11/17/22 1102 11/17/22 1157 11/17/22 1210 11/17/22 1228  BP: 124/80 131/67 (!) 140/57   Pulse: 68 71 75   Resp: 15 13 15    Temp:  97.7 F (36.5 C)    TempSrc:      SpO2: 95% 97% 96%    Weight:    112.2 kg  Height:        General exam: Middle-age female, moderately built and obese sitting up in bed without distress.  Undergoing HD. Respiratory system: Clear to auscultation.  No increased work of breathing. Cardiovascular system: S1 and S2 heard, RRR.  No JVD, murmurs or pedal edema.. Gastrointestinal system: Abdomen is nondistended, soft and nontender. No organomegaly or masses felt. Normal bowel sounds heard. Central nervous system: Alert and oriented. No focal neurological deficits. Extremities: Symmetric 5 x 5 power.  Left foot looks better.  Decreased swelling.  S/p left first Simmerman amputation, incisions more closed.  No drainage in dry.  Darkish discoloration of the dorsum of second toe and to a lesser extent the third and fourth toe as well.  No increased warmth.  Tenderness present but less.  No areas of fluctuation noted.  Overall left foot findings have significantly improved compared to admission. Skin: No rashes, lesions or ulcers Psychiatry: Judgement and insight appears impaired. Mood & affect appropriate    The results of significant diagnostics from this hospitalization (including imaging, microbiology, ancillary and laboratory) are listed below for reference.     Microbiology: Recent Results (from the past 240 hour(s))  Culture, blood (Routine X 2) w Reflex to ID Panel  Status: None (Preliminary result)   Collection Time: 11/15/22  9:47 AM   Specimen: BLOOD  Result Value Ref Range Status   Specimen Description BLOOD SITE NOT SPECIFIED  Final   Special Requests   Final    BOTTLES DRAWN AEROBIC ONLY Blood Culture results may not be optimal due to an inadequate volume of blood received in culture bottles   Culture   Final    NO GROWTH 2 DAYS Performed at [facility], [address], [place], [place] [address]    Report Status PENDING  Incomplete  Culture, blood (Routine X 2) w Reflex to ID Panel     Status: None (Preliminary result)    Collection Time: 11/15/22  9:49 AM   Specimen: BLOOD  Result Value Ref Range Status   Specimen Description BLOOD SITE NOT SPECIFIED  Final   Special Requests   Final    BOTTLES DRAWN AEROBIC ONLY Blood Culture results may not be optimal due to an inadequate volume of blood received in culture bottles   Culture   Final    NO GROWTH 2 DAYS Performed at [facility], [address], [place], [place] [address]    Report Status PENDING  Incomplete     Labs: CBC: Recent Labs  Lab 11/14/22 2257 11/15/22 0357 11/16/22 0409 11/17/22 0745  WBC 6.0 6.4 6.5 7.0  NEUTROABS  --  4.3  --   --   HGB 11.2* 10.6* 10.5* 10.3*  HCT 37.6 33.8* 34.5* 32.6*  MCV 92.6 89.9 93.0 89.8  PLT 154 146* 185 168    Basic Metabolic Panel: Recent Labs  Lab 11/14/22 2257 11/15/22 0948 11/16/22 0409 11/17/22 0116  NA  --  132* 136 132*  K  --  5.8* 5.8* 4.7  CL  --  92* 93* 93*  CO2  --  26 24 25   GLUCOSE  --  221* 137* 198*  BUN  --  48* 58* 32*  CREATININE 8.48* 9.26* 10.48* 7.13*  CALCIUM  --  9.0 9.0 8.9  MG 2.0  --   --   --     Liver Function Tests: Recent Labs  Lab 11/15/22 0948 11/16/22 0409  AST 49* 48*  ALT 53* 53*  ALKPHOS 348* 337*  BILITOT 0.5 0.3  PROT 6.7 6.9  ALBUMIN 2.9* 3.0*    CBG: Recent Labs  Lab 11/16/22 0531 11/16/22 1442 11/16/22 1959 11/16/22 2346 11/17/22 0543  GLUCAP 164* 122* 193* 192* 169*   I had discussed in detail with patient's daughter via phone yesterday, had updated care and answered all questions including possible discharge home today.  I attempted to reach her but via phone today but was unable to get her.  Time coordinating discharge: 35 minutes  SIGNED:  Marcellus Scott, MD,  FACP, FHM, [facility], [facility]   [facility] Physician Advisor [place]     To contact the attending provider between 7A-7P or the covering provider during after hours 7P-7A, please log into the web site [email or web address] and access using  universal [place] password for that web site. If you do not have the password, please call the hospital operator.

## 2022-11-17 NOTE — Progress Notes (Signed)
PT Cancellation Note  Patient Details Name: [name] MRN: [id number] DOB: 1955/09/20   Cancelled Treatment:    Reason Eval/Treat Not Completed: Patient at procedure or test/unavailable. Will re-attempt at later time/date.   Avin Upperman 11/17/2022, 11:32 AM

## 2022-11-17 NOTE — Progress Notes (Signed)
Received patient in bed.Awake,alert and oriented x 4.  Access used: Left AVF that worked well.  Duration of treatment: 4 hours.  Fluid removed: 2,900 CC  Hemo issue/comment:   Hand off to the patient's nurse.   [name]

## 2022-11-17 NOTE — TOC CM/SW Note (Addendum)
PT/OT recommendations HHPT and bedside commode.   PAtient currently in hemodialysis.   Called daughter Pricie Mclerran [phone] left message await call back.   1100 daughter Ronan Bataille [phone] returned call. Jazzslyn aware of discharge today. Once Jalana returns to room from dialysis , Myrtis Ser will discuss with her mother if she wants to go home to [place] or to [facility] in [place] at discharge. Alanda Amass will call NCM back when discharge address is determined.   Patient has had HH through  [facility].  [phone] spoke with Amy. Amy confirmed patient is active with them for [facility] and HHPT, will need resumption of care orders. Amy will need orders H and P , discharge summary PT/OT notes faxed to her at [phone] . They can continue services if she discahrges to her home in [place] fax [phone]   Daughter will provide transportation home   1330 Patient returned from hemodialysis. At discharge she wants to return to her home in [place]. She did not understand MD that she was discharged today and would like to speak to him. Sent secure chat.  MD spoke with daughter . Daughter will speak to patient. Discharge summary faxed to [facility] they are aware discharge is today . Spoke with daughter   Sister at bedside, wanting to speak to discharging doctor, she has questions about patient's foot. NCM messaged attending and Dr Lajoyce Corners . Attending spoke to sister and answered questions.

## 2022-11-17 NOTE — Progress Notes (Signed)
Discharge instructions given to pt. Pt verbalized understanding of all teaching and had no further questions. Pt discharged to home via wheelchair by NT with all belongings and paperwork.

## 2022-11-17 NOTE — Progress Notes (Signed)
Patient ID: [name], female   DOB: 1955/11/29, 67 y.o.   MRN: [id number] Patient is seen in follow-up for left first Runyan amputation.  Patient has microvascular disease and a nitroglycerin patch was started to be changed daily.  Examination patient has decreased swelling, there is no drainage, the incision is healing well, the ischemic changes to the second toe are improving.  There is good granulation tissue in the wound bed.  Will continue with the nitroglycerin patch continue with conservative therapy do not feel surgical intervention is necessary.  Patient may be weightbearing as tolerated for transfers but would hold on gait training at this time.

## 2022-11-17 NOTE — Progress Notes (Signed)
D/C order noted. Contacted [facility] [place] and spoke to [place]. Clinic advised of pt's d/c today and that pt will resume on Thursday. Will fax d/c summary and last renal note to clinic for continuation of care.   Olivia Canter Renal Navigator [phone]

## 2022-11-17 NOTE — Progress Notes (Signed)
Orthopedic Tech Progress Note Patient Details:  [name] 1955/08/31 [id number]  Secretary called about a CAM WALKER BOOT that was order per RN I told her that no one was in the room, with an attitude she stated " I'm just calling because the RN wanted a CAM WALKER BOOT" I told her I can't see an order for someone who was not even on the floor. But to call once she got there. On my end patient was given CAM WALKER BOOT on the 5th of this month.   Patient ID: [name], female   DOB: June 13, 1956, 67 y.o.   MRN: [id number]  Donald Pore 11/17/2022, 10:22 AM

## 2022-11-17 NOTE — Progress Notes (Signed)
Patient arrived back to 6 north room 18 alert and oriented. Pain level 5/10. Bed in lowest position. Bed alarm on . Call light in reach. Will continue to monitor pt.

## 2022-11-17 NOTE — Procedures (Signed)
I was present at this dialysis session. I have reviewed the session itself and made appropriate changes.   Filed Weights   11/17/22 0740  Weight: 115.1 kg    Recent Labs  Lab 11/17/22 0116  NA 132*  K 4.7  CL 93*  CO2 25  GLUCOSE 198*  BUN 32*  CREATININE 7.13*  CALCIUM 8.9    Recent Labs  Lab 11/15/22 0357 11/16/22 0409 11/17/22 0745  WBC 6.4 6.5 7.0  NEUTROABS 4.3  --   --   HGB 10.6* 10.5* 10.3*  HCT 33.8* 34.5* 32.6*  MCV 89.9 93.0 89.8  PLT 146* 185 168    Scheduled Meds:  aspirin EC  81 mg Oral Daily   atorvastatin  80 mg Oral QPM   carvedilol  12.5 mg Oral BID WC   Chlorhexidine Gluconate Cloth  6 each Topical Q0600   fluticasone furoate-vilanterol  1 puff Inhalation Daily   heparin  5,000 Units Subcutaneous Q8H   hydrOXYzine  25 mg Oral BID   insulin aspart  0-15 Units Subcutaneous TID WC   insulin aspart protamine- aspart  30 Units Subcutaneous BID WC   insulin glargine-yfgn  15 Units Subcutaneous QHS   methocarbamol  500 mg Oral BID   midodrine  10 mg Oral Q T,Th,Sa-HD   nitroGLYCERIN  0.2 mg Transdermal Daily   oxyCODONE  10 mg Oral QID   pantoprazole  40 mg Oral QAC breakfast   pregabalin  100 mg Oral BID   saccharomyces boulardii  250 mg Oral BID   sucroferric oxyhydroxide  1,500 mg Oral TID   torsemide  10 mg Oral Q48H   valACYclovir  500 mg Oral QODAY   vancomycin  125 mg Oral QID   Continuous Infusions:  piperacillin-tazobactam (ZOSYN)  IV 2.25 g (11/17/22 0530)   vancomycin     PRN Meds:.acetaminophen **OR** acetaminophen, fluticasone, HYDROmorphone (DILAUDID) injection, nitroGLYCERIN   Louie Bun,  MD 11/17/2022, 11:03 AM

## 2022-11-17 NOTE — Progress Notes (Signed)
Pt transferred to HD via bed by transportation staff

## 2022-11-17 NOTE — Progress Notes (Signed)
PRN dilaudid pain med given per pt request

## 2022-11-17 NOTE — Progress Notes (Signed)
Nephrology Follow-Up Consult note   Assessment/Recommendations: [name] is a/an 67 y.o. female with a past medical history significant for CAD, CHF, DM2, ESRD, admitted for foot infection.        OP HD: [place] [facility] - 4h  107.5kg  450 bfr  LUA AVF  2/2 bath  Heparin 3000  - missed Sat HD, last HD was Thursday 5/02     Diabetic foot infection - sp recent L 1st [place] amputation, here now with abscessed foot per MRI.  Antibiotics per primary team.  No plans for further surgical intervention at this time Recent Cdif colitis - per pmd Hyperkalemia -improved with hemodialysis ESRD - on HD TTS in [place], [place]. Is in [place] as daughter lives here and lots of her doctors are in [place].  Continue with dialysis per schedule HTN/ volume - BP's are wnl, no vol excess on exam, on room air.  Anemia esrd - Hb 10-12, no esa needs.  MBD ckd - CCa in range, add on phos. Cont binders.  COPD per pmd   Recommendations conveyed to primary service.    Darnell Level [facility] 11/17/2022 11:03 AM  ___________________________________________________________  CC: Foot infection  Interval History/Subjective: P patient resting on assist today with no complaints   Medications:  Current Facility-Administered Medications  Medication Dose Route Frequency Provider Last Rate Last Admin   acetaminophen (TYLENOL) tablet 650 mg  650 mg Oral Q6H PRN Crosley, Debby, MD       Or   acetaminophen (TYLENOL) suppository 650 mg  650 mg Rectal Q6H PRN Crosley, Debby, MD       aspirin EC tablet 81 mg  81 mg Oral Daily Hongalgi, Anand D, MD   81 mg at 11/15/22 1445   atorvastatin (LIPITOR) tablet 80 mg  80 mg Oral QPM Crosley, Debby, MD   80 mg at 11/16/22 1650   carvedilol (COREG) tablet 12.5 mg  12.5 mg Oral BID WC Crosley, Debby, MD   12.5 mg at 11/15/22 1905   Chlorhexidine Gluconate Cloth 2 % PADS 6 each  6 each Topical Q0600 Delano Metz, MD   6 each at 11/17/22 0530   fluticasone  (FLONASE) 50 MCG/ACT nasal spray 2 spray  2 spray Each Nare Daily PRN Hongalgi, Maximino Greenland, MD       fluticasone furoate-vilanterol (BREO ELLIPTA) 200-25 MCG/ACT 1 puff  1 puff Inhalation Daily Crosley, Debby, MD   1 puff at 11/16/22 1653   heparin injection 5,000 Units  5,000 Units Subcutaneous Q8H Crosley, Debby, MD   5,000 Units at 11/17/22 0530   HYDROmorphone (DILAUDID) injection 2 mg  2 mg Intravenous Q4H PRN Nadara Mustard, MD   2 mg at 11/16/22 1959   hydrOXYzine (ATARAX) tablet 25 mg  25 mg Oral BID Marcellus Scott D, MD   25 mg at 11/16/22 2241   insulin aspart (novoLOG) injection 0-15 Units  0-15 Units Subcutaneous TID WC Crosley, Debby, MD   2 Units at 11/16/22 1654   insulin aspart protamine- aspart (NOVOLOG MIX 70/30) injection 30 Units  30 Units Subcutaneous BID WC Crosley, Debby, MD   30 Units at 11/16/22 1753   insulin glargine-yfgn (SEMGLEE) injection 15 Units  15 Units Subcutaneous QHS Crosley, Debby, MD   15 Units at 11/16/22 2244   methocarbamol (ROBAXIN) tablet 500 mg  500 mg Oral BID Marcellus Scott D, MD   500 mg at 11/16/22 2241   midodrine (PROAMATINE) tablet 10 mg  10 mg  Oral Q T,Th,Sa-HD Gery Pray, MD   10 mg at 11/17/22 0902   nitroGLYCERIN (NITRODUR - Dosed in mg/24 hr) patch 0.2 mg  0.2 mg Transdermal Daily Nadara Mustard, MD   0.2 mg at 11/16/22 1650   nitroGLYCERIN (NITROSTAT) SL tablet 0.4 mg  0.4 mg Sublingual Q5 Min x 3 PRN Hongalgi, Anand D, MD       oxyCODONE (Oxy IR/ROXICODONE) immediate release tablet 10 mg  10 mg Oral QID Marcellus Scott D, MD   10 mg at 11/16/22 1443   pantoprazole (PROTONIX) EC tablet 40 mg  40 mg Oral QAC breakfast Hongalgi, Anand D, MD       piperacillin-tazobactam (ZOSYN) IVPB 2.25 g  2.25 g Intravenous Q8H Crosley, Debby, MD 100 mL/hr at 11/17/22 0530 2.25 g at 11/17/22 0530   pregabalin (LYRICA) capsule 100 mg  100 mg Oral BID Crosley, Debby, MD   100 mg at 11/16/22 2241   saccharomyces boulardii (FLORASTOR) capsule 250 mg  250 mg  Oral BID Crosley, Debby, MD   250 mg at 11/16/22 2241   sucroferric oxyhydroxide (VELPHORO) chewable tablet 1,500 mg  1,500 mg Oral TID Marcellus Scott D, MD   1,500 mg at 11/16/22 2243   torsemide (DEMADEX) tablet 10 mg  10 mg Oral Q48H Crosley, Debby, MD       valACYclovir (VALTREX) tablet 500 mg  500 mg Oral QODAY Hongalgi, Anand D, MD   500 mg at 11/15/22 1619   vancomycin (VANCOCIN) capsule 125 mg  125 mg Oral QID Gery Pray, MD   125 mg at 11/16/22 2241   vancomycin (VANCOREADY) IVPB 750 mg/150 mL  750 mg Intravenous Q T,Th,Sa-HD Crosley, Debby, MD          Review of Systems: 10 systems reviewed and negative except per interval history/subjective  Physical Exam: Vitals:   11/17/22 1034 11/17/22 1102  BP: (!) 147/64 124/80  Pulse: 69 68  Resp: 18 15  Temp:    SpO2: 98% 95%   No intake/output data recorded.  Intake/Output Summary (Last 24 hours) at 11/17/2022 1103 Last data filed at 11/17/2022 0354 Gross per 24 hour  Intake 560 ml  Output 2200 ml  Net -1640 ml   Constitutional: well-appearing, no acute distress ENMT: ears and nose without scars or lesions, MMM CV: normal rate, trace edema in the ankles Respiratory: Bilateral chest rise, normal work of breathing Gastrointestinal: soft, non-tender, no palpable masses or hernias Skin: Some swelling of the left foot and scar present, otherwise no visible lesions or rashes Psych: alert, judgement/insight appropriate, appropriate mood and affect   Test Results I personally reviewed new and old clinical labs and radiology tests Lab Results  Component Value Date   NA 132 (L) 11/17/2022   K 4.7 11/17/2022   CL 93 (L) 11/17/2022   CO2 25 11/17/2022   BUN 32 (H) 11/17/2022   CREATININE 7.13 (H) 11/17/2022   GFR 56.38 (L) 10/11/2014   CALCIUM 8.9 11/17/2022   ALBUMIN 3.0 (L) 11/16/2022   PHOS 3.0 11/01/2022    CBC Recent Labs  Lab 11/15/22 0357 11/16/22 0409 11/17/22 0745  WBC 6.4 6.5 7.0  NEUTROABS 4.3  --   --    HGB 10.6* 10.5* 10.3*  HCT 33.8* 34.5* 32.6*  MCV 89.9 93.0 89.8  PLT 146* 185 168

## 2022-11-18 ENCOUNTER — Encounter: Payer: 59 | Admitting: Family

## 2022-11-18 ENCOUNTER — Telehealth: Payer: Self-pay | Admitting: Orthopedic Surgery

## 2022-11-18 LAB — CULTURE, BLOOD (ROUTINE X 2)

## 2022-11-18 NOTE — Telephone Encounter (Signed)
Patient states she wants to see [name], on her appointment

## 2022-11-18 NOTE — Telephone Encounter (Signed)
Can you call pt and resch to see Dr. Lajoyce Corners? She is sch for Erin on 11/25/2022

## 2022-11-19 LAB — CULTURE, BLOOD (ROUTINE X 2): Culture: NO GROWTH

## 2022-11-20 ENCOUNTER — Telehealth: Payer: Self-pay | Admitting: Orthopedic Surgery

## 2022-11-20 LAB — CULTURE, BLOOD (ROUTINE X 2)

## 2022-11-20 NOTE — Telephone Encounter (Signed)
Called patient left message on voicemail to return call to reschedule her appointment with Dr. Lajoyce Corners per pt request for next week

## 2022-11-20 NOTE — Telephone Encounter (Signed)
Would you mind trying to reach this pt to resch her appt for next week. She states that she only wants to see dr. Lajoyce Corners and is sch on Erin's sch. Let me know and I can open a time. Thanks!!

## 2022-11-25 ENCOUNTER — Encounter: Payer: 59 | Admitting: Family

## 2022-11-26 ENCOUNTER — Ambulatory Visit (INDEPENDENT_AMBULATORY_CARE_PROVIDER_SITE_OTHER): Payer: 59 | Admitting: Orthopedic Surgery

## 2022-11-26 DIAGNOSIS — Z89412 Acquired absence of left great toe: Secondary | ICD-10-CM

## 2022-11-30 ENCOUNTER — Encounter (HOSPITAL_COMMUNITY): Payer: Self-pay | Admitting: Orthopedic Surgery

## 2022-12-01 ENCOUNTER — Encounter: Payer: 59 | Admitting: Orthopedic Surgery

## 2022-12-02 ENCOUNTER — Ambulatory Visit (INDEPENDENT_AMBULATORY_CARE_PROVIDER_SITE_OTHER): Payer: 59 | Admitting: Family

## 2022-12-02 ENCOUNTER — Encounter: Payer: Self-pay | Admitting: Family

## 2022-12-02 ENCOUNTER — Telehealth: Payer: Self-pay

## 2022-12-02 DIAGNOSIS — Z89412 Acquired absence of left great toe: Secondary | ICD-10-CM

## 2022-12-02 NOTE — Telephone Encounter (Signed)
FYI Pt called triage and stated her left leg is swollen. States looks like a elephant leg. This started yesterday. Pt spoke with erin who advised she come see her today. Pt was unsure she would be able to come in a be seen. Erin advised to cb and left [place] know if she was unable to.

## 2022-12-02 NOTE — Progress Notes (Signed)
Post-Op Visit Note   Patient: [name]           Date of Birth: 19-May-1956           MRN: [id number] Visit Date: 12/02/2022 PCP: Felix Pacini, FNP  Chief Complaint:  Chief Complaint  Patient presents with   Left Foot - Routine Post Op    10/28/2022 left foot 1st [place] amputation     HPI:  HPI The patient is a 67 year old woman who is seen status post left foot first [place] amputation right.  Seen today as a walk-in for concern of worsening edema to the left lower extremity that she feels that the edema has been painful she has an area over the lateral foot which is tender denies fever or chills Ortho Exam 2+ pitting edema with erythema warmth or weeping to the left lower extremity up to the tibial tubercle on examination of the left foot her first [place] amputation is well-approximated sutures there is no ischemia.  No active drainage or maceration she does have an area of pressure injury over the dorsal lateral foot.  Visit Diagnoses: No diagnosis found.  Plan: Continue daily Dial soap cleansing.  Dry dressings.  Discussed elevation and compression for swelling.  Offload the lateral foot.  Follow-up as scheduled.  Follow-Up Instructions: Return as scheduled.   Imaging: No results found.  Orders:  No orders of the defined types were placed in this encounter.  No orders of the defined types were placed in this encounter.    PMFS History: Patient Active Problem List   Diagnosis Date Noted   Type 2 diabetes mellitus with diabetic foot infection (HCC) 11/15/2022   Diabetic foot infection (HCC) 11/14/2022   Pancolitis (HCC) 10/21/2022   Osteomyelitis of left foot (HCC) 10/21/2022   Acute encephalopathy 10/20/2022   Cellulitis of left lower extremity 07/23/2022   Type 2 diabetes mellitus with chronic kidney disease, with long-term current use of insulin (HCC) 07/23/2022   GERD without esophagitis 07/23/2022   Peripheral neuropathy 07/23/2022   GI bleed 09/17/2021    Severe sepsis (HCC) 04/21/2021   COVID-19 virus infection 04/20/2021   Upper GI bleed 09/17/2020   Prolonged QT interval 09/17/2020   COPD (chronic obstructive pulmonary disease) (HCC) 09/17/2020   Acute pulmonary edema (HCC) 09/14/2020   Acute respiratory failure with hypoxia (HCC) 09/13/2020   Allergy, unspecified, initial encounter 03/29/2020   Anaphylactic shock, unspecified, initial encounter 03/29/2020   Volume overload 01/30/2020   Pain in left upper arm 01/18/2020   Cellulitis 01/16/2020   Anemia due to end stage renal disease (HCC) 01/16/2020   Diabetic polyneuropathy associated with type 2 diabetes mellitus (HCC) 11/06/2019   ESRD on hemodialysis (HCC) 11/06/2019   Nystagmus 11/06/2019   PVD (peripheral vascular disease) (HCC) 11/06/2019   Vertigo of central origin 11/06/2019   Weakness    Hypervolemia associated with renal insufficiency 10/31/2019   BMI 45.0-49.9, adult (HCC) 10/18/2019   Ascending aortic aneurysm (HCC) 03/27/2019   Puncture wound without foreign body of left forearm, initial encounter 01/02/2019   Non-healing wound of upper extremity 12/28/2018   Unspecified open wound of left upper arm, initial encounter 12/06/2018   Complication from renal dialysis device 10/20/2018   Iron deficiency anemia, unspecified 10/18/2018   ESRD (end stage renal disease) (HCC) 06/01/2018   ARF (acute renal failure) (HCC) 05/09/2018   Colon cancer screening    LGSIL on Pap smear of cervix 01/27/2018   Gout 12/25/2017   AKI (  acute kidney injury) (HCC) 12/24/2017   Hyperglycemia 12/24/2017   Acute on chronic diastolic CHF (congestive heart failure) (HCC) 10/03/2017   Chronic diastolic heart failure (HCC) 09/30/2017   Lymphedema 09/30/2017   Acute metabolic encephalopathy 06/02/2017   Aortic stenosis    Acute pain of both knees 02/12/2017   Chronic gout due to renal impairment of multiple sites without tophus 02/12/2017   Esophageal dysphagia 02/12/2017   Osteoarthritis  01/22/2017   Diabetic hyperosmolar non-ketotic state (HCC) 12/13/2016   CAD (coronary artery disease) 12/13/2016   Hyperlipidemia 12/13/2016   Hyperphosphatemia 12/13/2016   Acute diastolic CHF (congestive heart failure) (HCC)    Hyperkalemia 08/21/2016   Asthma 11/29/2015   Depression 09/05/2015   GERD (gastroesophageal reflux disease) 03/25/2015   Environmental allergies 03/14/2015   SOB (shortness of breath) 02/15/2015   Chronic hepatitis C without hepatic coma (HCC) 01/31/2015   Diabetic neuropathy (HCC) 01/31/2015   OSA (obstructive sleep apnea) 01/01/2015   Poor dentition 11/13/2014   Essential hypertension 10/08/2014   Morbid (severe) obesity due to excess calories (HCC) 10/08/2014   Shortness of breath    Localized, primary osteoarthritis 01/25/2014   Family history of diabetes mellitus 03/31/2013   Pain in limb 03/31/2013   Uncontrolled type 2 diabetes mellitus with hyperosmolar nonketotic hyperglycemia (HCC) 03/25/2012    Class: Chronic   Past Medical History:  Diagnosis Date   Anemia    Aortic stenosis    Echo 8/18: mean 13, peak 28, LVOT/AV mean velocity 0.51   Arthritis    Asthma    As a child    Bronchitis    CAD (coronary artery disease)    a. 09/2016: 50% Ost 1st Mrg stenosis, 50% 2nd Mrg stenosis, 20% Mid-Cx, 95% Prox LAD, 40% mid-LAD, and 10% dist-LAD stenosis. Staged PCI with DES to Prox-LAD.    Chronic combined systolic and diastolic CHF (congestive heart failure) (HCC) 2011   echo 2/18: EF 55-60, normal wall motion, grade 2 diastolic dysfunction, trivial AI // echo 3/18: Septal and apical HK, EF 45-50, normal wall motion, trivial AI, mild LAE, PASP 38 // echo 8/18: EF 60-65, normal wall motion, grade 1 diastolic dysfunction, calcified aortic valve leaflets, mild aortic stenosis (mean 13, peak 28, LVOT/AV mean velocity 0.51), mild AI, moderate MAC, mild LAE, trivial TR    Chronic kidney disease    STAGE 4   Chronic kidney disease on chronic dialysis (HCC)     t, th, sat   Complication of anesthesia    Depression    Diabetes mellitus Dx 1989   Elevated lipids    GERD (gastroesophageal reflux disease)    Gout    Heart murmur    asymptomatic   Hepatitis C Dx 2013   Hypertension Dx 1989   Infected surgical wound    Lt arm   Myocardial infarction (HCC) 07/2015   Obesity    Pancreatitis 2013   Pneumonia    Refusal of blood transfusions as patient is [employer]hov[employer]'s Witness    pt states she is not [place] witness and does not refuse blood products   Tendinitis    Tremors of nervous system    LEFT HAND   Ulcer 2010    Family History  Problem Relation Age of Onset   Colon cancer Mother    Heart attack Other    Heart attack Maternal Grandmother    Hypertension Sister    Hypertension Brother    Diabetes Paternal Grandmother    Breast cancer Neg Hx  Past Surgical History:  Procedure Laterality Date   A/V FISTULAGRAM Left 04/11/2019   Procedure: A/V FISTULAGRAM;  Surgeon: Renford Dills, MD;  Location: [facility];  Service: Cardiovascular;  Laterality: Left;   A/V FISTULAGRAM Left 06/02/2019   Procedure: A/V FISTULAGRAM;  Surgeon: Renford Dills, MD;  Location: [facility];  Service: Cardiovascular;  Laterality: Left;   AMPUTATION Left 10/28/2022   Procedure: LEFT FOOT 1ST [place] AMPUTATION;  Surgeon: Nadara Mustard, MD;  Location: [facility] OR;  Service: Orthopedics;  Laterality: Left;   APPLICATION OF WOUND VAC Left 08/14/2017   Procedure: APPLICATION OF WOUND VAC Exchange;  Surgeon: Earline Mayotte, MD;  Location: [facility] ORS;  Service: General;  Laterality: Left;   APPLICATION OF WOUND VAC Left 12/21/2018   Procedure: APPLICATION OF WOUND VAC;  Surgeon: Renford Dills, MD;  Location: [facility] ORS;  Service: Vascular;  Laterality: Left;   AV FISTULA PLACEMENT Left 08/19/2018   Procedure: ARTERIOVENOUS (AV) FISTULA CREATION ( BRACHIOBASILIC );  Surgeon: Renford Dills, MD;  Location: [facility] OR[facility];  Service: Vascular;   Laterality: Left;   BASCILIC VEIN TRANSPOSITION Left 11/18/2018   Procedure: BASCILIC VEIN TRANSPOSITION;  Surgeon: Renford Dills, MD;  Location: [facility] ORS;  Service: Vascular;  Laterality: Left;   BIOPSY  09/20/2020   Procedure: BIOPSY;  Surgeon: Jeani Hawking, MD;  Location: [facility] ENDOSCOPY;  Service: Endoscopy;;   CHOLECYSTECTOMY     COLONOSCOPY WITH PROPOFOL N/A 02/03/2018   Procedure: COLONOSCOPY WITH PROPOFOL;  Surgeon: Toney Reil, MD;  [facility];  Service: Gastroenterology;  Laterality: N/A;   COLONOSCOPY WITH PROPOFOL N/A 07/25/2022   Procedure: COLONOSCOPY WITH PROPOFOL;  Surgeon: Regis Bill, MD;  Location: [facility] ENDOSCOPY;  Service: Endoscopy;  Laterality: N/A;   CORONARY ANGIOPLASTY  07/2015   STENT   CORONARY STENT INTERVENTION N/A 09/18/2016   Procedure: Coronary Stent Intervention;  Surgeon: Lennette Bihari, MD;  Location: [facility];  Service: Cardiovascular;  Laterality: N/A;   DIALYSIS/PERMA CATHETER INSERTION N/A 05/10/2018   Procedure: DIALYSIS/PERMA CATHETER INSERTION;  Surgeon: Renford Dills, MD;  Location: [facility];  Service: Cardiovascular;  Laterality: N/A;   DRESSING CHANGE UNDER ANESTHESIA Left 08/15/2017   Procedure: exploration of wound for bleeding;  Surgeon: Earline Mayotte, MD;  Location: [facility] ORS;  Service: General;  Laterality: Left;   ESOPHAGOGASTRODUODENOSCOPY (EGD) WITH PROPOFOL N/A 02/03/2018   Procedure: ESOPHAGOGASTRODUODENOSCOPY (EGD) WITH PROPOFOL;  Surgeon: Toney Reil, MD;  Location: [facility] ENDOSCOPY;  Service: Gastroenterology;  Laterality: N/A;   ESOPHAGOGASTRODUODENOSCOPY (EGD) WITH PROPOFOL N/A 09/20/2020   Procedure: ESOPHAGOGASTRODUODENOSCOPY (EGD) WITH PROPOFOL;  Surgeon: Jeani Hawking, MD;  Location: [facility] ENDOSCOPY;  Service: Endoscopy;  Laterality: N/A;   ESOPHAGOGASTRODUODENOSCOPY (EGD) WITH PROPOFOL N/A 07/25/2022   Procedure: ESOPHAGOGASTRODUODENOSCOPY (EGD) WITH PROPOFOL;  Surgeon:  Regis Bill, MD;  Location: [facility] ENDOSCOPY;  Service: Endoscopy;  Laterality: N/A;   EYE SURGERY  11/17/2018   INCISION AND DRAINAGE ABSCESS Left 08/12/2017   Procedure: INCISION AND DRAINAGE ABSCESS;  Surgeon: Earline Mayotte, MD;  Location: [facility] ORS;  Service: General;  Laterality: Left;   KNEE ARTHROSCOPY     LEFT HEART CATH N/A 09/18/2016   Procedure: Left Heart Cath;  Surgeon: Lennette Bihari, MD;  Location: [facility];  Service: Cardiovascular;  Laterality: N/A;   LEFT HEART CATH AND CORONARY ANGIOGRAPHY N/A 09/16/2016   Procedure: Left Heart Cath and Coronary Angiography;  Surgeon: Kathleene Hazel, MD;  Location: [facility];  Service: Cardiovascular;  Laterality: N/A;   LEFT HEART CATH AND CORONARY ANGIOGRAPHY N/A 04/29/2017   Procedure: LEFT HEART CATH AND CORONARY ANGIOGRAPHY;  Surgeon: Yvonne Kendall, MD;  Location: [facility];  Service: Cardiovascular;  Laterality: N/A;   LOWER EXTREMITY ANGIOGRAPHY Right 03/08/2018   Procedure: LOWER EXTREMITY ANGIOGRAPHY;  Surgeon: Renford Dills, MD;  Location: [facility];  Service: Cardiovascular;  Laterality: Right;   TUBAL LIGATION     TUBAL LIGATION     UPPER EXTREMITY ANGIOGRAPHY Right 09/19/2019   Procedure: UPPER EXTREMITY ANGIOGRAPHY;  Surgeon: Renford Dills, MD;  Location: [facility];  Service: Cardiovascular;  Laterality: Right;   WOUND DEBRIDEMENT Left 12/21/2018   Procedure: DEBRIDEMENT WOUND;  Surgeon: Renford Dills, MD;  Location: [facility] ORS;  Service: Vascular;  Laterality: Left;   WOUND DEBRIDEMENT Left 12/30/2018   Procedure: DEBRIDEMENT WOUND WITH VAC PLACEMENT (LEFT UPPER EXTREMITY);  Surgeon: Renford Dills, MD;  Location: [facility] ORS;  Service: Vascular;  Laterality: Left;   Social History   Occupational History   Occupation: disability  Tobacco Use   Smoking status: Former    Packs/day: 0.25    Years: 6.00    Additional pack years: 0.00    Total pack  years: 1.50    Types: Cigarettes    Quit date: 10/25/1980    Years since quitting: 42.1   Smokeless tobacco: Never  Vaping Use   Vaping Use: Never used  Substance and Sexual Activity   Alcohol use: Not Currently    Comment: rare   Drug use: Not Currently    Types: Marijuana   Sexual activity: Not on file    Comment: Not asked

## 2022-12-02 NOTE — Telephone Encounter (Signed)
Pt in office today

## 2022-12-07 ENCOUNTER — Encounter: Payer: Self-pay | Admitting: Orthopedic Surgery

## 2022-12-07 NOTE — Progress Notes (Signed)
Office Visit Note   Patient: [name]           Date of Birth: 03-05-1956           MRN: [id number] Visit Date: 11/26/2022              Requested by: Felix Pacini, FNP [address] [place],  [place] [address] PCP: Felix Pacini, FNP  Chief Complaint  Patient presents with   Left Foot - Routine Post Op    10/28/2022 left foot 1st [place] amputation       HPI: Patient is a 67 year old woman status post left foot first [place] amputation.  She is nonweightbearing in a wheelchair using nitroglycerin patch to help improve microcirculation.  Patient complains of pain she is under pain management contract with her primary care physician.  Patient is 4 weeks out from surgery.  Assessment & Plan: Visit Diagnoses:  1. Left great toe amputee (HCC)     Plan: Recommended continue using the nitroglycerin patch recommended Dial soap cleansing elevation and dry dressing changes follow-up in 2 weeks to remove the sutures.  Follow-Up Instructions: Return in about 2 weeks (around 12/10/2022).   Ortho Exam  Patient is alert, oriented, no adenopathy, well-dressed, normal affect, normal respiratory effort. Patient has ischemic pain of her left foot.  The first [place] amputation incision is healing well there is superficial epithelialization and a small area of black eschar.  Imaging: No results found. No images are attached to the encounter.  Labs: Lab Results  Component Value Date   HGBA1C 8.8 (H) 07/23/2022   HGBA1C 8.1 (A) 10/28/2021   HGBA1C 10.7 (A) 07/23/2021   ESRSEDRATE 26 (H) 10/21/2022   ESRSEDRATE 82 (H) 12/28/2018   ESRSEDRATE 55 (H) 12/27/2017   CRP 1.1 (H) 10/21/2022   CRP 5.5 (H) 04/23/2021   CRP 6.2 (H) 04/22/2021   LABURIC 9.9 (H) 07/23/2022   LABURIC 4.4 09/22/2020   LABURIC 6.9 01/31/2020   REPTSTATUS 11/20/2022 FINAL 11/15/2022   GRAMSTAIN  12/21/2018    MODERATE WBC PRESENT, PREDOMINANTLY PMN NO ORGANISMS SEEN Performed at [facility],  [address], [place], [place] [address]    CULT  11/15/2022    NO GROWTH 5 DAYS Performed at [facility], [address] [place], [place] [address]    [facility] ENTEROCOCCUS FAECALIS 12/21/2018     Lab Results  Component Value Date   ALBUMIN 3.0 (L) 11/16/2022   ALBUMIN 2.9 (L) 11/15/2022   ALBUMIN 3.0 (L) 11/01/2022   PREALBUMIN 15.5 (L) 09/14/2020    Lab Results  Component Value Date   MG 2.0 11/14/2022   MG 2.3 10/21/2022   MG 1.9 07/26/2022   Lab Results  Component Value Date   VD25OH See Scanned report in [facility] 07/23/2022    Lab Results  Component Value Date   PREALBUMIN 15.5 (L) 09/14/2020      Latest Ref Rng & Units 11/17/2022    7:45 AM 11/16/2022    4:09 AM 11/15/2022    3:57 AM  CBC EXTENDED  WBC 4.0 - 10.5 K/uL 7.0  6.5  6.4   RBC 3.87 - 5.11 MIL/uL 3.63  3.71  3.76   Hemoglobin 12.0 - 15.0 g/dL 28.4  13.2  44.0   HCT 36.0 - 46.0 % 32.6  34.5  33.8   Platelets 150 - 400 K/uL 168  185  146   NEUT# 1.7 - 7.7 K/uL   4.3  Lymph# 0.7 - 4.0 K/uL   1.1      There is no height or weight on file to calculate BMI.  Orders:  No orders of the defined types were placed in this encounter.  No orders of the defined types were placed in this encounter.    Procedures: No procedures performed  Clinical Data: No additional findings.  ROS:  All other systems negative, except as noted in the HPI. Review of Systems  Objective: Vital Signs: There were no vitals taken for this visit.  Specialty Comments:  No specialty comments available.  PMFS History: Patient Active Problem List   Diagnosis Date Noted   Type 2 diabetes mellitus with diabetic foot infection (HCC) 11/15/2022   Diabetic foot infection (HCC) 11/14/2022   Pancolitis (HCC) 10/21/2022   Osteomyelitis of left foot (HCC) 10/21/2022   Acute encephalopathy 10/20/2022   Cellulitis of left lower extremity 07/23/2022   Type 2 diabetes mellitus with chronic kidney disease, with  long-term current use of insulin (HCC) 07/23/2022   GERD without esophagitis 07/23/2022   Peripheral neuropathy 07/23/2022   GI bleed 09/17/2021   Severe sepsis (HCC) 04/21/2021   COVID-19 virus infection 04/20/2021   Upper GI bleed 09/17/2020   Prolonged QT interval 09/17/2020   COPD (chronic obstructive pulmonary disease) (HCC) 09/17/2020   Acute pulmonary edema (HCC) 09/14/2020   Acute respiratory failure with hypoxia (HCC) 09/13/2020   Allergy, unspecified, initial encounter 03/29/2020   Anaphylactic shock, unspecified, initial encounter 03/29/2020   Volume overload 01/30/2020   Pain in left upper arm 01/18/2020   Cellulitis 01/16/2020   Anemia due to end stage renal disease (HCC) 01/16/2020   Diabetic polyneuropathy associated with type 2 diabetes mellitus (HCC) 11/06/2019   ESRD on hemodialysis (HCC) 11/06/2019   Nystagmus 11/06/2019   PVD (peripheral vascular disease) (HCC) 11/06/2019   Vertigo of central origin 11/06/2019   Weakness    Hypervolemia associated with renal insufficiency 10/31/2019   BMI 45.0-49.9, adult (HCC) 10/18/2019   Ascending aortic aneurysm (HCC) 03/27/2019   Puncture wound without foreign body of left forearm, initial encounter 01/02/2019   Non-healing wound of upper extremity 12/28/2018   Unspecified open wound of left upper arm, initial encounter 12/06/2018   Complication from renal dialysis device 10/20/2018   Iron deficiency anemia, unspecified 10/18/2018   ESRD (end stage renal disease) (HCC) 06/01/2018   ARF (acute renal failure) (HCC) 05/09/2018   Colon cancer screening    LGSIL on Pap smear of cervix 01/27/2018   Gout 12/25/2017   AKI (acute kidney injury) (HCC) 12/24/2017   Hyperglycemia 12/24/2017   Acute on chronic diastolic CHF (congestive heart failure) (HCC) 10/03/2017   Chronic diastolic heart failure (HCC) 09/30/2017   Lymphedema 09/30/2017   Acute metabolic encephalopathy 06/02/2017   Aortic stenosis    Acute pain of both knees  02/12/2017   Chronic gout due to renal impairment of multiple sites without tophus 02/12/2017   Esophageal dysphagia 02/12/2017   Osteoarthritis 01/22/2017   Diabetic hyperosmolar non-ketotic state (HCC) 12/13/2016   CAD (coronary artery disease) 12/13/2016   Hyperlipidemia 12/13/2016   Hyperphosphatemia 12/13/2016   Acute diastolic CHF (congestive heart failure) (HCC)    Hyperkalemia 08/21/2016   Asthma 11/29/2015   Depression 09/05/2015   GERD (gastroesophageal reflux disease) 03/25/2015   Environmental allergies 03/14/2015   SOB (shortness of breath) 02/15/2015   Chronic hepatitis C without hepatic coma (HCC) 01/31/2015   Diabetic neuropathy (HCC) 01/31/2015   OSA (obstructive sleep apnea) 01/01/2015   Poor  dentition 11/13/2014   Essential hypertension 10/08/2014   Morbid (severe) obesity due to excess calories (HCC) 10/08/2014   Shortness of breath    Localized, primary osteoarthritis 01/25/2014   Family history of diabetes mellitus 03/31/2013   Pain in limb 03/31/2013   Uncontrolled type 2 diabetes mellitus with hyperosmolar nonketotic hyperglycemia (HCC) 03/25/2012    Class: Chronic   Past Medical History:  Diagnosis Date   Anemia    Aortic stenosis    Echo 8/18: mean 13, peak 28, LVOT/AV mean velocity 0.51   Arthritis    Asthma    As a child    Bronchitis    CAD (coronary artery disease)    a. 09/2016: 50% Ost 1st Mrg stenosis, 50% 2nd Mrg stenosis, 20% Mid-Cx, 95% Prox LAD, 40% mid-LAD, and 10% dist-LAD stenosis. Staged PCI with DES to Prox-LAD.    Chronic combined systolic and diastolic CHF (congestive heart failure) (HCC) 2011   echo 2/18: EF 55-60, normal wall motion, grade 2 diastolic dysfunction, trivial AI // echo 3/18: Septal and apical HK, EF 45-50, normal wall motion, trivial AI, mild LAE, PASP 38 // echo 8/18: EF 60-65, normal wall motion, grade 1 diastolic dysfunction, calcified aortic valve leaflets, mild aortic stenosis (mean 13, peak 28, LVOT/AV mean  velocity 0.51), mild AI, moderate MAC, mild LAE, trivial TR    Chronic kidney disease    STAGE 4   Chronic kidney disease on chronic dialysis (HCC)    t, th, sat   Complication of anesthesia    Depression    Diabetes mellitus Dx 1989   Elevated lipids    GERD (gastroesophageal reflux disease)    Gout    Heart murmur    asymptomatic   Hepatitis C Dx 2013   Hypertension Dx 1989   Infected surgical wound    Lt arm   Myocardial infarction (HCC) 07/2015   Obesity    Pancreatitis 2013   Pneumonia    Refusal of blood transfusions as patient is [employer]hov[employer]'s Witness    pt states she is not [place] witness and does not refuse blood products   Tendinitis    Tremors of nervous system    LEFT HAND   Ulcer 2010    Family History  Problem Relation Age of Onset   Colon cancer Mother    Heart attack Other    Heart attack Maternal Grandmother    Hypertension Sister    Hypertension Brother    Diabetes Paternal Grandmother    Breast cancer Neg Hx     Past Surgical History:  Procedure Laterality Date   A/V FISTULAGRAM Left 04/11/2019   Procedure: A/V FISTULAGRAM;  Surgeon: Renford Dills, MD;  Location: [facility];  Service: Cardiovascular;  Laterality: Left;   A/V FISTULAGRAM Left 06/02/2019   Procedure: A/V FISTULAGRAM;  Surgeon: Renford Dills, MD;  Location: [facility];  Service: Cardiovascular;  Laterality: Left;   AMPUTATION Left 10/28/2022   Procedure: LEFT FOOT 1ST [place] AMPUTATION;  Surgeon: Nadara Mustard, MD;  Location: [facility];  Service: Orthopedics;  Laterality: Left;   APPLICATION OF WOUND VAC Left 08/14/2017   Procedure: APPLICATION OF WOUND VAC Exchange;  Surgeon: Earline Mayotte, MD;  Location: [facility];  Service: General;  Laterality: Left;   APPLICATION OF WOUND VAC Left 12/21/2018   Procedure: APPLICATION OF WOUND VAC;  Surgeon: Renford Dills, MD;  Location: [facility];  Service: Vascular;  Laterality: Left;   AV FISTULA PLACEMENT Left 08/19/2018  Procedure: ARTERIOVENOUS (AV) FISTULA CREATION ( BRACHIOBASILIC );  Surgeon: Renford Dills, MD;  Location: [facility];  Service: Vascular;  Laterality: Left;   BASCILIC VEIN TRANSPOSITION Left 11/18/2018   Procedure: BASCILIC VEIN TRANSPOSITION;  Surgeon: Renford Dills, MD;  Location: [facility];  Service: Vascular;  Laterality: Left;   BIOPSY  09/20/2020   Procedure: BIOPSY;  Surgeon: Jeani Hawking, MD;  Location: [facility] ENDOSCOPY;  Service: Endoscopy;;   CHOLECYSTECTOMY     COLONOSCOPY WITH PROPOFOL N/A 02/03/2018   Procedure: COLONOSCOPY WITH PROPOFOL;  Surgeon: Toney Reil, MD;  Location: [facility] ENDOSCOPY;  Service: Gastroenterology;  Laterality: N/A;   COLONOSCOPY WITH PROPOFOL N/A 07/25/2022   Procedure: COLONOSCOPY WITH PROPOFOL;  Surgeon: Regis Bill, MD;  Location: [facility]OS[facility];  Service: Endoscopy;  Laterality: N/A;   CORONARY ANGIOPLASTY  07/2015   STENT   CORONARY STENT INTERVENTION N/A 09/18/2016   Procedure: Coronary Stent Intervention;  Surgeon: Lennette Bihari, MD;  Location: [facility];  Service: Cardiovascular;  Laterality: N/A;   DIALYSIS/PERMA CATHETER INSERTION N/A 05/10/2018   Procedure: DIALYSIS/PERMA CATHETER INSERTION;  Surgeon: Renford Dills, MD;  Location: [facility];  Service: Cardiovascular;  Laterality: N/A;   DRESSING CHANGE UNDER ANESTHESIA Left 08/15/2017   Procedure: exploration of wound for bleeding;  Surgeon: Earline Mayotte, MD;  Location: [facility];  Service: General;  Laterality: Left;   ESOPHAGOGASTRODUODENOSCOPY (EGD) WITH PROPOFOL N/A 02/03/2018   Procedure: ESOPHAGOGASTRODUODENOSCOPY (EGD) WITH PROPOFOL;  Surgeon: Toney Reil, MD;  Location: [facility];  Service: Gastroenterology;  Laterality: N/A;   ESOPHAGOGASTRODUODENOSCOPY (EGD) WITH PROPOFOL N/A 09/20/2020   Procedure: ESOPHAGOGASTRODUODENOSCOPY (EGD) WITH PROPOFOL;  Surgeon: Jeani Hawking, MD;  Location: [facility] ENDOSCOPY;  Service: Endoscopy;  Laterality:  N/A;   ESOPHAGOGASTRODUODENOSCOPY (EGD) WITH PROPOFOL N/A 07/25/2022   Procedure: ESOPHAGOGASTRODUODENOSCOPY (EGD) WITH PROPOFOL;  Surgeon: Regis Bill, MD;  Location: [facility] ENDOS[facility];  Service: Endoscopy;  Laterality: N/A;   EYE SURGERY  11/17/2018   INCISION AND DRAINAGE ABSCESS Left 08/12/2017   Procedure: INCISION AND DRAINAGE ABSCESS;  Surgeon: Earline Mayotte, MD;  Location: [facility];  Service: General;  Laterality: Left;   KNEE ARTHROSCOPY     LEFT HEART CATH N/A 09/18/2016   Procedure: Left Heart Cath;  Surgeon: Lennette Bihari, MD;  Location: [facility];  Service: Cardiovascular;  Laterality: N/A;   LEFT HEART CATH AND CORONARY ANGIOGRAPHY N/A 09/16/2016   Procedure: Left Heart Cath and Coronary Angiography;  Surgeon: Kathleene Hazel, MD;  Location: [facility];  Service: Cardiovascular;  Laterality: N/A;   LEFT HEART CATH AND CORONARY ANGIOGRAPHY N/A 04/29/2017   Procedure: LEFT HEART CATH AND CORONARY ANGIOGRAPHY;  Surgeon: Yvonne Kendall, MD;  Location: [facility];  Service: Cardiovascular;  Laterality: N/A;   LOWER EXTREMITY ANGIOGRAPHY Right 03/08/2018   Procedure: LOWER EXTREMITY ANGIOGRAPHY;  Surgeon: Renford Dills, MD;  Location: [facility];  Service: Cardiovascular;  Laterality: Right;   TUBAL LIGATION     TUBAL LIGATION     UPPER EXTREMITY ANGIOGRAPHY Right 09/19/2019   Procedure: UPPER EXTREMITY ANGIOGRAPHY;  Surgeon: Renford Dills, MD;  Location: [facility];  Service: Cardiovascular;  Laterality: Right;   WOUND DEBRIDEMENT Left 12/21/2018   Procedure: DEBRIDEMENT WOUND;  Surgeon: Renford Dills, MD;  Location: [facility];  Service: Vascular;  Laterality: Left;   WOUND DEBRIDEMENT Left 12/30/2018   Procedure: DEBRIDEMENT WOUND WITH VAC PLACEMENT (LEFT UPPER EXTREMITY);  Surgeon:  Schnier, Latina Craver, MD;  Location: [facility];  Service: Vascular;  Laterality: Left;   Social History   Occupational History    Occupation: disability  Tobacco Use   Smoking status: Former    Packs/day: 0.25    Years: 6.00    Additional pack years: 0.00    Total pack years: 1.50    Types: Cigarettes    Quit date: 10/25/1980    Years since quitting: 42.1   Smokeless tobacco: Never  Vaping Use   Vaping Use: Never used  Substance and Sexual Activity   Alcohol use: Not Currently    Comment: rare   Drug use: Not Currently    Types: Marijuana   Sexual activity: Not on file    Comment: Not asked

## 2022-12-16 ENCOUNTER — Telehealth: Payer: Self-pay | Admitting: Orthopedic Surgery

## 2022-12-16 NOTE — Telephone Encounter (Signed)
Patient called triage left message Stating the foot she had amputation on, stitches are healing within the wound.  States it is painful Would like a call back [phone]

## 2022-12-17 NOTE — Telephone Encounter (Signed)
I called pt and offered an appt to come in Friday and she declined due to transportation. The pt has an appt sch for 12/24/2022 and advised we would remove her stitches at that time. Pt states that she will wait for that appt time and call if she has any other concerns.

## 2022-12-24 ENCOUNTER — Ambulatory Visit (INDEPENDENT_AMBULATORY_CARE_PROVIDER_SITE_OTHER): Payer: Medicare HMO | Admitting: Orthopedic Surgery

## 2022-12-24 DIAGNOSIS — Z89412 Acquired absence of left great toe: Secondary | ICD-10-CM

## 2022-12-24 MED ORDER — NITROGLYCERIN 0.2 MG/HR TD PT24: 0.2000 mg | MEDICATED_PATCH | Freq: Every day | TRANSDERMAL | 11 refills | Status: DC

## 2022-12-24 MED ORDER — PREGABALIN 100 MG PO CAPS: 100.0000 mg | ORAL_CAPSULE | Freq: Two times a day (BID) | ORAL | 3 refills | Status: DC

## 2022-12-30 ENCOUNTER — Encounter: Payer: Self-pay | Admitting: Orthopedic Surgery

## 2022-12-30 NOTE — Progress Notes (Signed)
Office Visit Note   Patient: [name]           Date of Birth: 06/03/1956           MRN: [id number] Visit Date: 12/24/2022              Requested by: Felix Pacini, FNP [address] [place],  [place] [address] PCP: Felix Pacini, FNP  Chief Complaint  Patient presents with   Left Knee - Routine Post Op    10/28/2022 left foot 1st [place] amputation      HPI: Patient is a 67 year old woman who is 2 months status post left foot first [place] amputation.  Assessment & Plan: Visit Diagnoses:  1. Left great toe amputee (HCC)     Plan: Refills provided for the nitroglycerin patch and Lyrica.  Continue dry dressing changes.  Weightbearing as tolerated.  Follow-Up Instructions: Return in about 4 weeks (around 01/21/2023).   Ortho Exam  Patient is alert, oriented, no adenopathy, well-dressed, normal affect, normal respiratory effort. Examination the first [place] amputation incision is almost completely healed there is a small area 5 mm granulation tissue.  Imaging: No results found. No images are attached to the encounter.  Labs: Lab Results  Component Value Date   HGBA1C 8.8 (H) 07/23/2022   HGBA1C 8.1 (A) 10/28/2021   HGBA1C 10.7 (A) 07/23/2021   ESRSEDRATE 26 (H) 10/21/2022   ESRSEDRATE 82 (H) 12/28/2018   ESRSEDRATE 55 (H) 12/27/2017   CRP 1.1 (H) 10/21/2022   CRP 5.5 (H) 04/23/2021   CRP 6.2 (H) 04/22/2021   LABURIC 9.9 (H) 07/23/2022   LABURIC 4.4 09/22/2020   LABURIC 6.9 01/31/2020   REPTSTATUS 11/20/2022 FINAL 11/15/2022   GRAMSTAIN  12/21/2018    MODERATE WBC PRESENT, PREDOMINANTLY PMN NO ORGANISMS SEEN Performed at [facility], [address] [place], [place] [address]    CULT  11/15/2022    NO GROWTH 5 DAYS Performed at [facility], [address] [place], [place] [address]    [facility] ENTEROCOCCUS FAECALIS 12/21/2018     Lab Results  Component Value Date   ALBUMIN 3.0 (L) 11/16/2022   ALBUMIN 2.9 (L) 11/15/2022    ALBUMIN 3.0 (L) 11/01/2022   PREALBUMIN 15.5 (L) 09/14/2020    Lab Results  Component Value Date   MG 2.0 11/14/2022   MG 2.3 10/21/2022   MG 1.9 07/26/2022   Lab Results  Component Value Date   VD25OH See Scanned report in [facility] 07/23/2022    Lab Results  Component Value Date   PREALBUMIN 15.5 (L) 09/14/2020      Latest Ref Rng & Units 11/17/2022    7:45 AM 11/16/2022    4:09 AM 11/15/2022    3:57 AM  CBC EXTENDED  WBC 4.0 - 10.5 K/uL 7.0  6.5  6.4   RBC 3.87 - 5.11 MIL/uL 3.63  3.71  3.76   Hemoglobin 12.0 - 15.0 g/dL 44.0  34.7  42.5   HCT 36.0 - 46.0 % 32.6  34.5  33.8   Platelets 150 - 400 K/uL 168  185  146   NEUT# 1.7 - 7.7 K/uL   4.3   Lymph# 0.7 - 4.0 K/uL   1.1      There is no height or weight on file to calculate BMI.  Orders:  No orders of the defined types were placed in this encounter.  Meds ordered this encounter  Medications   pregabalin (LYRICA) 100  MG capsule    Sig: Take 1 capsule (100 mg total) by mouth 2 (two) times daily.    Dispense:  60 capsule    Refill:  3   nitroGLYCERIN (NITRODUR - DOSED IN MG/24 HR) 0.2 mg/hr patch    Sig: Place 1 patch (0.2 mg total) onto the skin daily. Apply 1 patch to the dorsum of the ankle and change location daily.  Wash ankle and foot with soap and water daily dry before applying patch.    Dispense:  30 patch    Refill:  11     Procedures: No procedures performed  Clinical Data: No additional findings.  ROS:  All other systems negative, except as noted in the HPI. Review of Systems  Objective: Vital Signs: There were no vitals taken for this visit.  Specialty Comments:  No specialty comments available.  PMFS History: Patient Active Problem List   Diagnosis Date Noted   Type 2 diabetes mellitus with diabetic foot infection (HCC) 11/15/2022   Diabetic foot infection (HCC) 11/14/2022   Pancolitis (HCC) 10/21/2022   Osteomyelitis of left foot (HCC) 10/21/2022   Acute encephalopathy  10/20/2022   Cellulitis of left lower extremity 07/23/2022   Type 2 diabetes mellitus with chronic kidney disease, with long-term current use of insulin (HCC) 07/23/2022   GERD without esophagitis 07/23/2022   Peripheral neuropathy 07/23/2022   GI bleed 09/17/2021   Severe sepsis (HCC) 04/21/2021   COVID-19 virus infection 04/20/2021   Upper GI bleed 09/17/2020   Prolonged QT interval 09/17/2020   COPD (chronic obstructive pulmonary disease) (HCC) 09/17/2020   Acute pulmonary edema (HCC) 09/14/2020   Acute respiratory failure with hypoxia (HCC) 09/13/2020   Allergy, unspecified, initial encounter 03/29/2020   Anaphylactic shock, unspecified, initial encounter 03/29/2020   Volume overload 01/30/2020   Pain in left upper arm 01/18/2020   Cellulitis 01/16/2020   Anemia due to end stage renal disease (HCC) 01/16/2020   Diabetic polyneuropathy associated with type 2 diabetes mellitus (HCC) 11/06/2019   ESRD on hemodialysis (HCC) 11/06/2019   Nystagmus 11/06/2019   PVD (peripheral vascular disease) (HCC) 11/06/2019   Vertigo of central origin 11/06/2019   Weakness    Hypervolemia associated with renal insufficiency 10/31/2019   BMI 45.0-49.9, adult (HCC) 10/18/2019   Ascending aortic aneurysm (HCC) 03/27/2019   Puncture wound without foreign body of left forearm, initial encounter 01/02/2019   Non-healing wound of upper extremity 12/28/2018   Unspecified open wound of left upper arm, initial encounter 12/06/2018   Complication from renal dialysis device 10/20/2018   Iron deficiency anemia, unspecified 10/18/2018   ESRD (end stage renal disease) (HCC) 06/01/2018   ARF (acute renal failure) (HCC) 05/09/2018   Colon cancer screening    LGSIL on Pap smear of cervix 01/27/2018   Gout 12/25/2017   AKI (acute kidney injury) (HCC) 12/24/2017   Hyperglycemia 12/24/2017   Acute on chronic diastolic CHF (congestive heart failure) (HCC) 10/03/2017   Chronic diastolic heart failure (HCC)  13/02/6577   Lymphedema 09/30/2017   Acute metabolic encephalopathy 06/02/2017   Aortic stenosis    Acute pain of both knees 02/12/2017   Chronic gout due to renal impairment of multiple sites without tophus 02/12/2017   Esophageal dysphagia 02/12/2017   Osteoarthritis 01/22/2017   Diabetic hyperosmolar non-ketotic state (HCC) 12/13/2016   CAD (coronary artery disease) 12/13/2016   Hyperlipidemia 12/13/2016   Hyperphosphatemia 12/13/2016   Acute diastolic CHF (congestive heart failure) (HCC)    Hyperkalemia 08/21/2016   Asthma 11/29/2015  Depression 09/05/2015   GERD (gastroesophageal reflux disease) 03/25/2015   Environmental allergies 03/14/2015   SOB (shortness of breath) 02/15/2015   Chronic hepatitis C without hepatic coma (HCC) 01/31/2015   Diabetic neuropathy (HCC) 01/31/2015   OSA (obstructive sleep apnea) 01/01/2015   Poor dentition 11/13/2014   Essential hypertension 10/08/2014   Morbid (severe) obesity due to excess calories (HCC) 10/08/2014   Shortness of breath    Localized, primary osteoarthritis 01/25/2014   Family history of diabetes mellitus 03/31/2013   Pain in limb 03/31/2013   Uncontrolled type 2 diabetes mellitus with hyperosmolar nonketotic hyperglycemia (HCC) 03/25/2012    Class: Chronic   Past Medical History:  Diagnosis Date   Anemia    Aortic stenosis    Echo 8/18: mean 13, peak 28, LVOT/AV mean velocity 0.51   Arthritis    Asthma    As a child    Bronchitis    CAD (coronary artery disease)    a. 09/2016: 50% Ost 1st Mrg stenosis, 50% 2nd Mrg stenosis, 20% Mid-Cx, 95% Prox LAD, 40% mid-LAD, and 10% dist-LAD stenosis. Staged PCI with DES to Prox-LAD.    Chronic combined systolic and diastolic CHF (congestive heart failure) (HCC) 2011   echo 2/18: EF 55-60, normal wall motion, grade 2 diastolic dysfunction, trivial AI // echo 3/18: Septal and apical HK, EF 45-50, normal wall motion, trivial AI, mild LAE, PASP 38 // echo 8/18: EF 60-65, normal wall  motion, grade 1 diastolic dysfunction, calcified aortic valve leaflets, mild aortic stenosis (mean 13, peak 28, LVOT/AV mean velocity 0.51), mild AI, moderate MAC, mild LAE, trivial TR    Chronic kidney disease    STAGE 4   Chronic kidney disease on chronic dialysis (HCC)    t, th, sat   Complication of anesthesia    Depression    Diabetes mellitus Dx 1989   Elevated lipids    GERD (gastroesophageal reflux disease)    Gout    Heart murmur    asymptomatic   Hepatitis C Dx 2013   Hypertension Dx 1989   Infected surgical wound    Lt arm   Myocardial infarction (HCC) 07/2015   Obesity    Pancreatitis 2013   Pneumonia    Refusal of blood transfusions as patient is [employer]hov[employer]'s Witness    pt states she is not [place] witness and does not refuse blood products   Tendinitis    Tremors of nervous system    LEFT HAND   Ulcer 2010    Family History  Problem Relation Age of Onset   Colon cancer Mother    Heart attack Other    Heart attack Maternal Grandmother    Hypertension Sister    Hypertension Brother    Diabetes Paternal Grandmother    Breast cancer Neg Hx     Past Surgical History:  Procedure Laterality Date   A/V FISTULAGRAM Left 04/11/2019   Procedure: A/V FISTULAGRAM;  Surgeon: Renford Dills, MD;  Location: [facility];  Service: Cardiovascular;  Laterality: Left;   A/V FISTULAGRAM Left 06/02/2019   Procedure: A/V FISTULAGRAM;  Surgeon: Renford Dills, MD;  Location: [facility];  Service: Cardiovascular;  Laterality: Left;   AMPUTATION Left 10/28/2022   Procedure: LEFT FOOT 1ST [place] AMPUTATION;  Surgeon: Nadara Mustard, MD;  Location: [facility] OR;  Service: Orthopedics;  Laterality: Left;   APPLICATION OF WOUND VAC Left 08/14/2017   Procedure: APPLICATION OF WOUND VAC Exchange;  Surgeon: Earline Mayotte, MD;  Location: [facility] ORS;  Service: General;  Laterality: Left;   APPLICATION OF WOUND VAC Left 12/21/2018   Procedure: APPLICATION OF WOUND VAC;   Surgeon: Renford Dills, MD;  Location: [facility];  Service: Vascular;  Laterality: Left;   AV FISTULA PLACEMENT Left 08/19/2018   Procedure: ARTERIOVENOUS (AV) FISTULA CREATION ( BRACHIOBASILIC );  Surgeon: Renford Dills, MD;  Location: [facility];  Service: Vascular;  Laterality: Left;   BASCILIC VEIN TRANSPOSITION Left 11/18/2018   Procedure: BASCILIC VEIN TRANSPOSITION;  Surgeon: Renford Dills, MD;  Location: [facility];  Service: Vascular;  Laterality: Left;   BIOPSY  09/20/2020   Procedure: BIOPSY;  Surgeon: Jeani Hawking, MD;  Location: [facility];  Service: Endoscopy;;   CHOLECYSTECTOMY     COLONOSCOPY WITH PROPOFOL N/A 02/03/2018   Procedure: COLONOSCOPY WITH PROPOFOL;  Surgeon: Toney Reil, MD;  Location: [facility]OSCOPY;  Service: Gastroenterology;  Laterality: N/A;   COLONOSCOPY WITH PROPOFOL N/A 07/25/2022   Procedure: COLONOSCOPY WITH PROPOFOL;  Surgeon: Regis Bill, MD;  Location: [facility]OSCOPY;  Service: Endoscopy;  Laterality: N/A;   CORONARY ANGIOPLASTY  07/2015   STENT   CORONARY STENT INTERVENTION N/A 09/18/2016   Procedure: Coronary Stent Intervention;  Surgeon: Lennette Bihari, MD;  Location: [facility];  Service: Cardiovascular;  Laterality: N/A;   DIALYSIS/PERMA CATHETER INSERTION N/A 05/10/2018   Procedure: DIALYSIS/PERMA CATHETER INSERTION;  Surgeon: Renford Dills, MD;  Location: [facility];  Service: Cardiovascular;  Laterality: N/A;   DRESSING CHANGE UNDER ANESTHESIA Left 08/15/2017   Procedure: exploration of wound for bleeding;  Surgeon: Earline Mayotte, MD;  Location: [facility];  Service: General;  Laterality: Left;   ESOPHAGOGASTRODUODENOSCOPY (EGD) WITH PROPOFOL N/A 02/03/2018   Procedure: ESOPHAGOGASTRODUODENOSCOPY (EGD) WITH PROPOFOL;  Surgeon: Toney Reil, MD;  Location: [facility]OS[facility];  Service: Gastroenterology;  Laterality: N/A;   ESOPHAGOGASTRODUODENOSCOPY (EGD) WITH PROPOFOL N/A 09/20/2020   Procedure:  ESOPHAGOGASTRODUODENOSCOPY (EGD) WITH PROPOFOL;  Surgeon: Jeani Hawking, MD;  Location: [facility] ENDOSCOPY;  Service: Endoscopy;  Laterality: N/A;   ESOPHAGOGASTRODUODENOSCOPY (EGD) WITH PROPOFOL N/A 07/25/2022   Procedure: ESOPHAGOGASTRODUODENOSCOPY (EGD) WITH PROPOFOL;  Surgeon: Regis Bill, MD;  Location: [facility] ENDOSCOPY;  Service: Endoscopy;  Laterality: N/A;   EYE SURGERY  11/17/2018   INCISION AND DRAINAGE ABSCESS Left 08/12/2017   Procedure: INCISION AND DRAINAGE ABSCESS;  Surgeon: Earline Mayotte, MD;  Location: [facility];  Service: General;  Laterality: Left;   KNEE ARTHROSCOPY     LEFT HEART CATH N/A 09/18/2016   Procedure: Left Heart Cath;  Surgeon: Lennette Bihari, MD;  Location: [facility];  Service: Cardiovascular;  Laterality: N/A;   LEFT HEART CATH AND CORONARY ANGIOGRAPHY N/A 09/16/2016   Procedure: Left Heart Cath and Coronary Angiography;  Surgeon: Kathleene Hazel, MD;  [facility];  Service: Cardiovascular;  Laterality: N/A;   LEFT HEART CATH AND CORONARY ANGIOGRAPHY N/A 04/29/2017   Procedure: LEFT HEART CATH AND CORONARY ANGIOGRAPHY;  Surgeon: Yvonne Kendall, MD;  Location: [facility];  Service: Cardiovascular;  Laterality: N/A;   LOWER EXTREMITY ANGIOGRAPHY Right 03/08/2018   Procedure: LOWER EXTREMITY ANGIOGRAPHY;  Surgeon: Renford Dills, MD;  Location: [facility];  Service: Cardiovascular;  Laterality: Right;   TUBAL LIGATION     TUBAL LIGATION     UPPER EXTREMITY ANGIOGRAPHY Right 09/19/2019   Procedure: UPPER EXTREMITY ANGIOGRAPHY;  Surgeon: Renford Dills, MD;  Location: [facility];  Service:  Cardiovascular;  Laterality: Right;   WOUND DEBRIDEMENT Left 12/21/2018   Procedure: DEBRIDEMENT WOUND;  Surgeon: Renford Dills, MD;  Location: [facility];  Service: Vascular;  Laterality: Left;   WOUND DEBRIDEMENT Left 12/30/2018   Procedure: DEBRIDEMENT WOUND WITH VAC PLACEMENT (LEFT UPPER EXTREMITY);  Surgeon:  Renford Dills, MD;  Location: [facility];  Service: Vascular;  Laterality: Left;   Social History   Occupational History   Occupation: disability  Tobacco Use   Smoking status: Former    Packs/day: 0.25    Years: 6.00    Additional pack years: 0.00    Total pack years: 1.50    Types: Cigarettes    Quit date: 10/25/1980    Years since quitting: 42.2   Smokeless tobacco: Never  Vaping Use   Vaping Use: Never used  Substance and Sexual Activity   Alcohol use: Not Currently    Comment: rare   Drug use: Not Currently    Types: Marijuana   Sexual activity: Not on file    Comment: Not asked

## 2023-01-01 IMAGING — DX DG CHEST 1V PORT
1 series · 1 of 1 positions shown · non-contrast
Comparison: 01/30/2020

CLINICAL DATA: Shortness of breath

EXAM:
PORTABLE CHEST 1 VIEW

[chest ap]
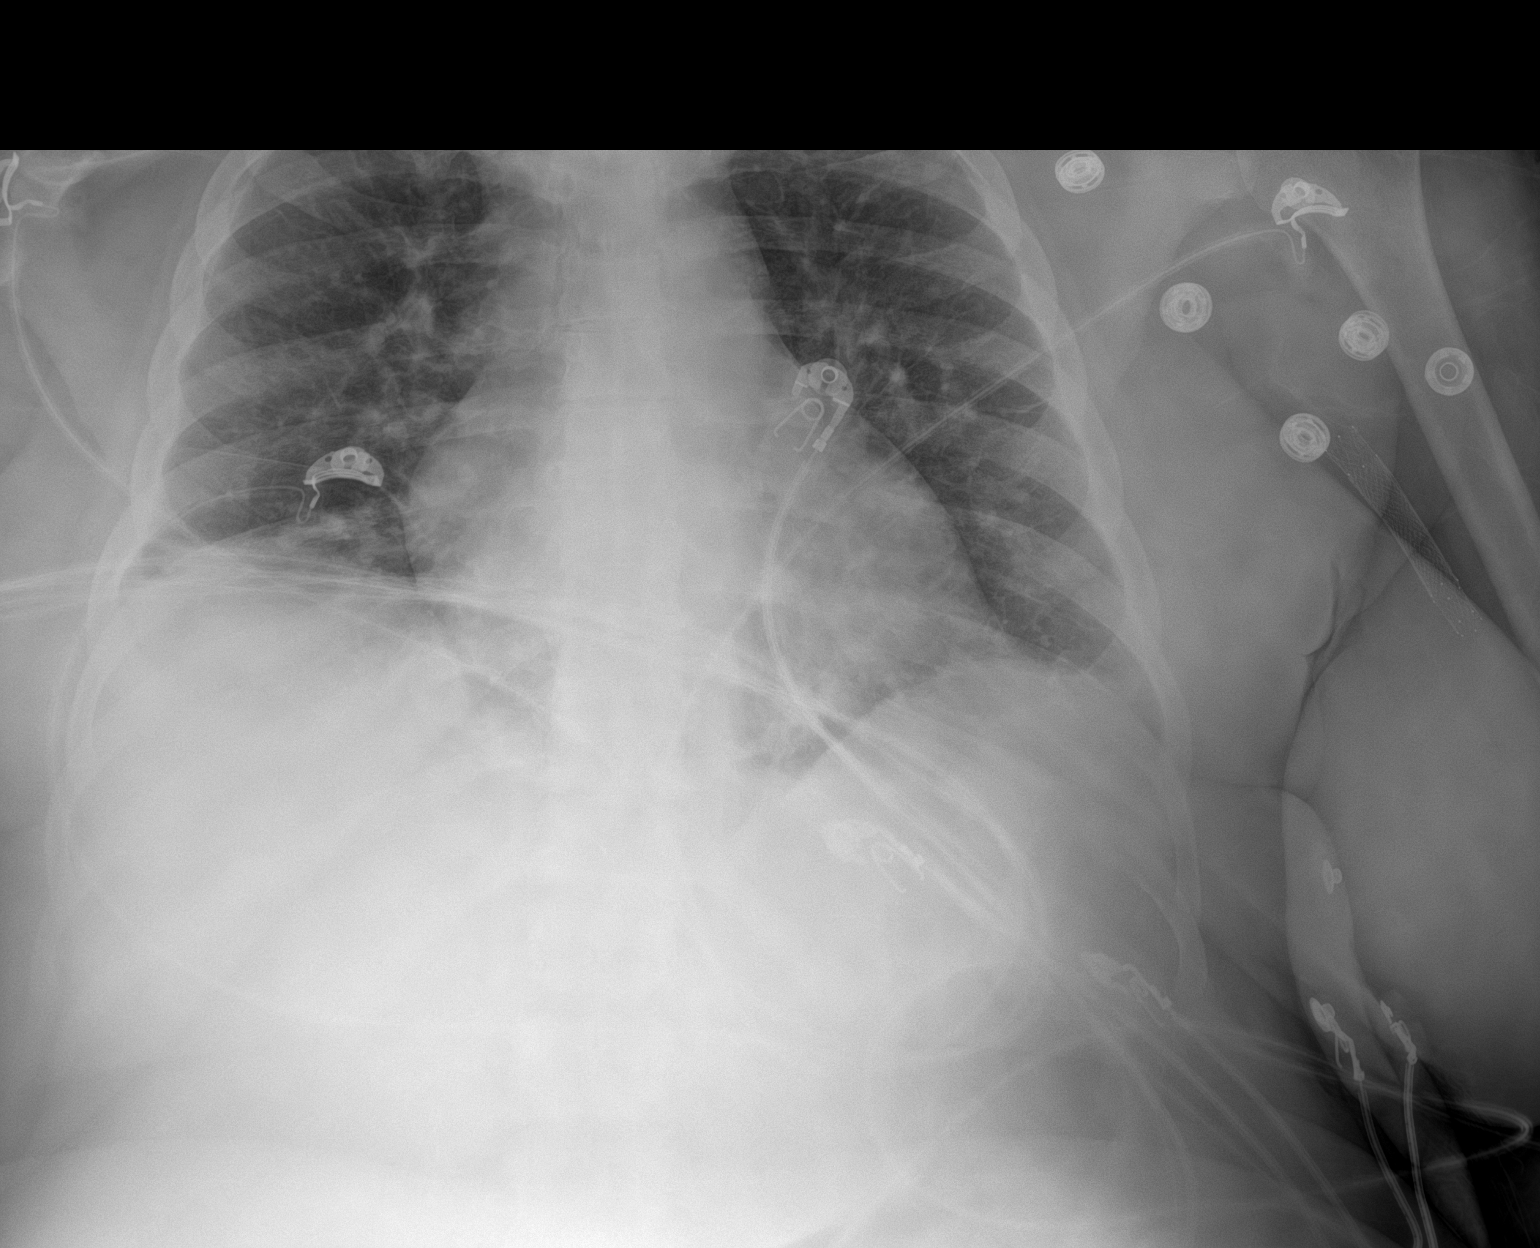

[1 of 1 positions shown; findings below may reference images not displayed]

FINDINGS: Cardiac shadow is prominent but stable. Mild vascular congestion is
seen increased from the prior exam. No pulmonary edema is noted.
Mild right basilar atelectasis is seen.
IMPRESSION: Mild vascular congestion and right basilar atelectasis. No focal
confluent infiltrate is noted.

## 2023-01-05 IMAGING — DX DG CHEST 1V PORT
1 series · 1 of 1 positions shown · non-contrast
Comparison: Radiograph 09/13/2020

CLINICAL DATA: Weakness

EXAM:
PORTABLE CHEST 1 VIEW

[chest ap]
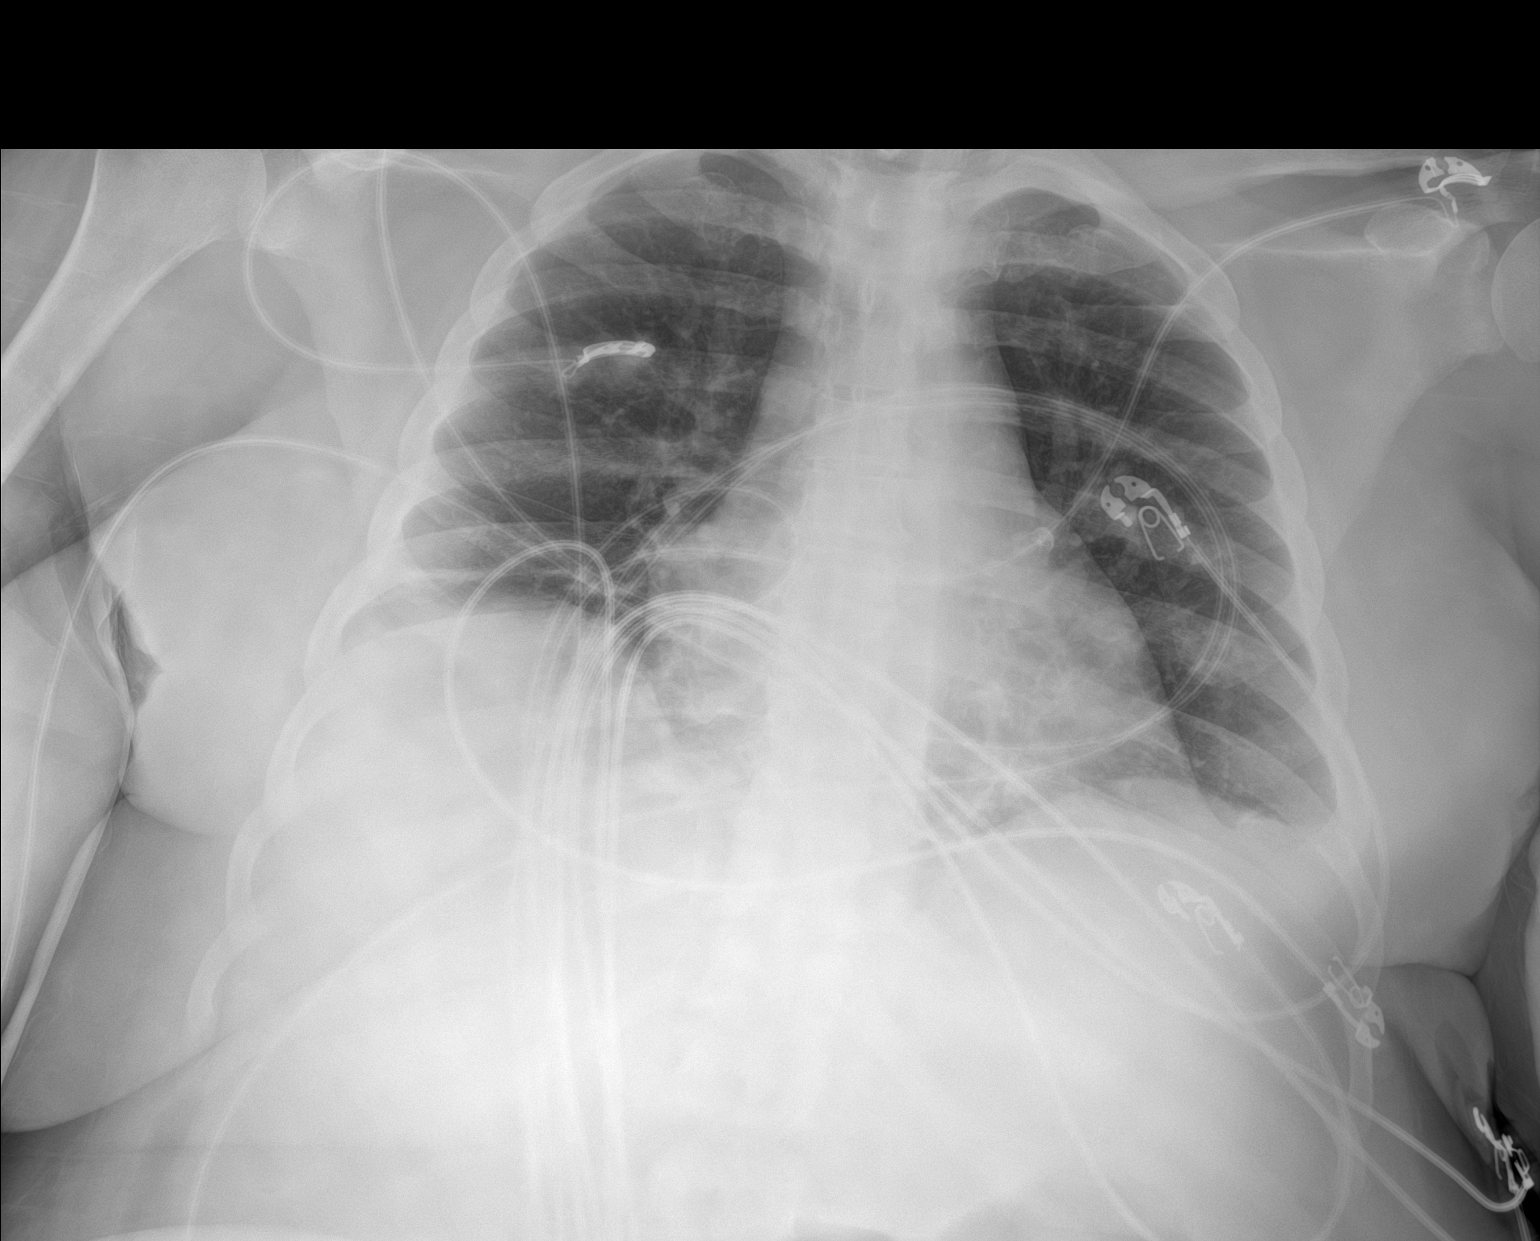

[1 of 1 positions shown; findings below may reference images not displayed]

FINDINGS: Stable cardiomediastinal contours. Some minimal vascular congestion
without features of frank edema. No consolidation, pneumothorax, or
effusion. Telemetry leads overlie the chest. No acute osseous or
soft tissue abnormality.
IMPRESSION: Minimal vascular congestion without features of frank edema.

No other acute cardiopulmonary abnormality.

## 2023-01-10 IMAGING — DX DG FOOT COMPLETE 3+V*L*
3 series · 3 of 3 positions shown · non-contrast
Comparison: None.

CLINICAL DATA: Chronic left foot pain, worsening, no known injury

EXAM:
LEFT FOOT - COMPLETE 3+ VIEW

[foot ap]
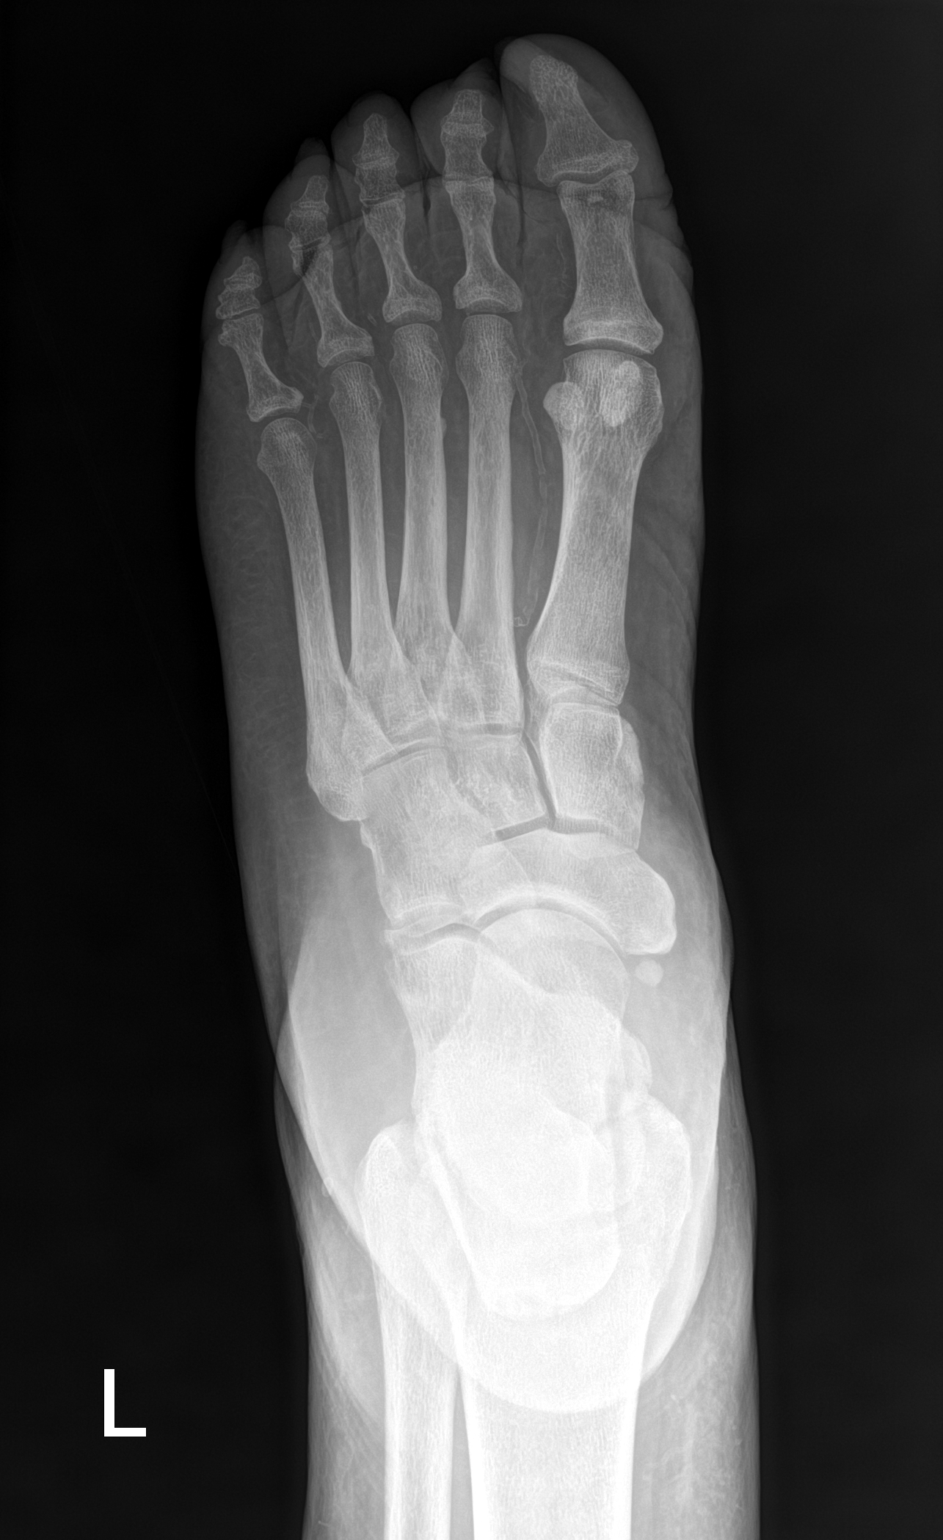

[foot obl]
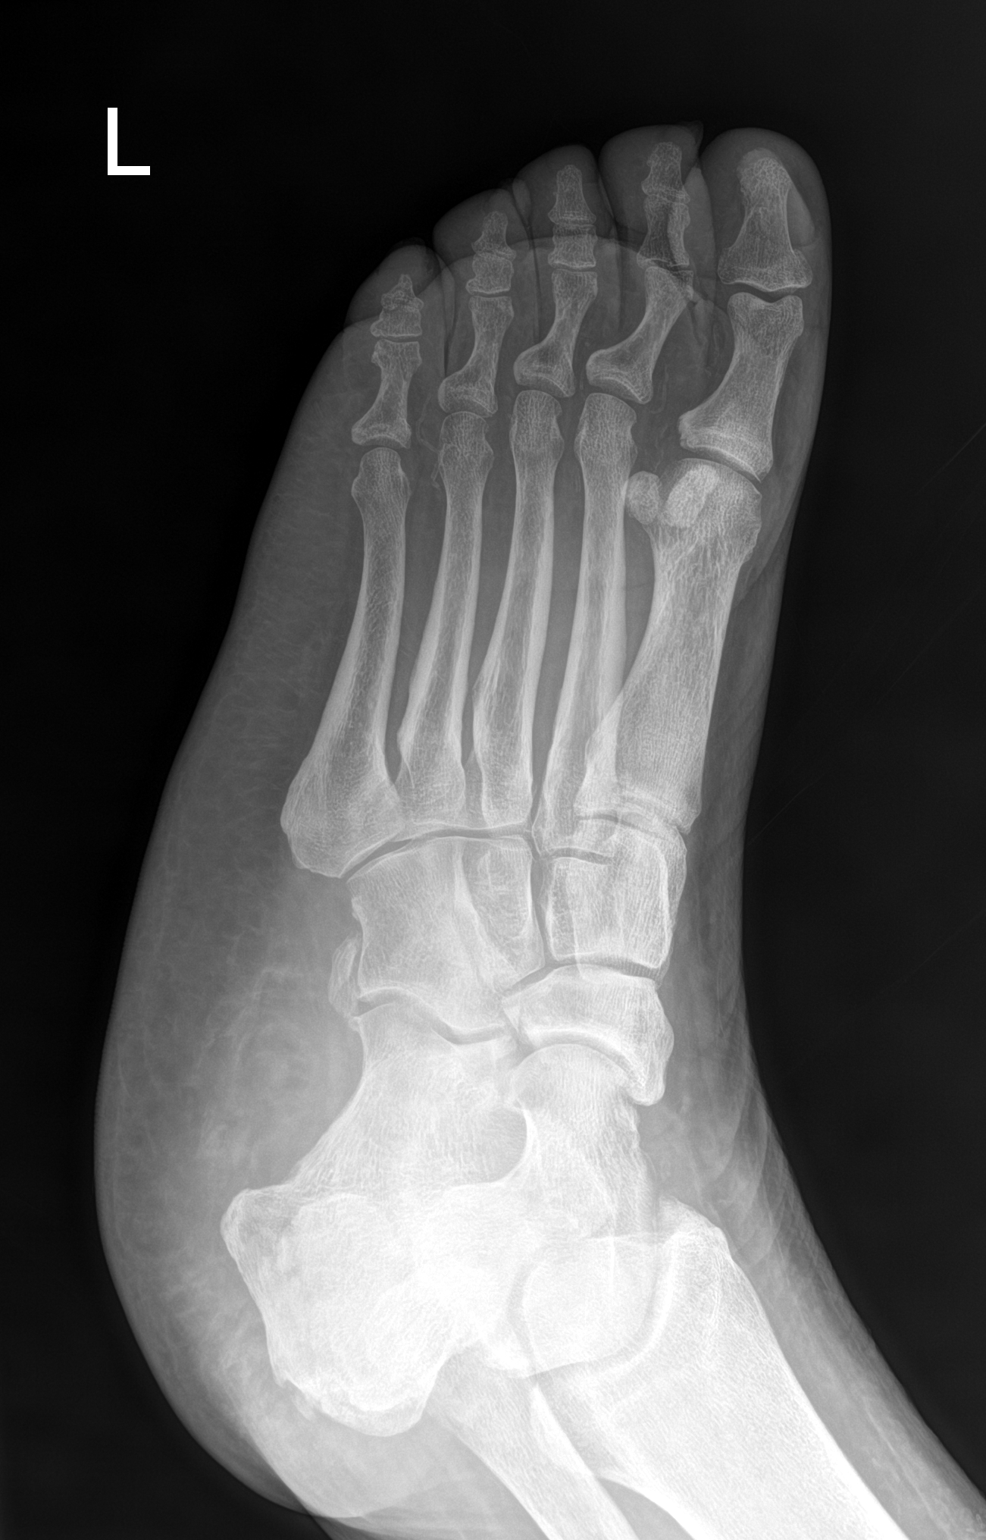

[foot lat]
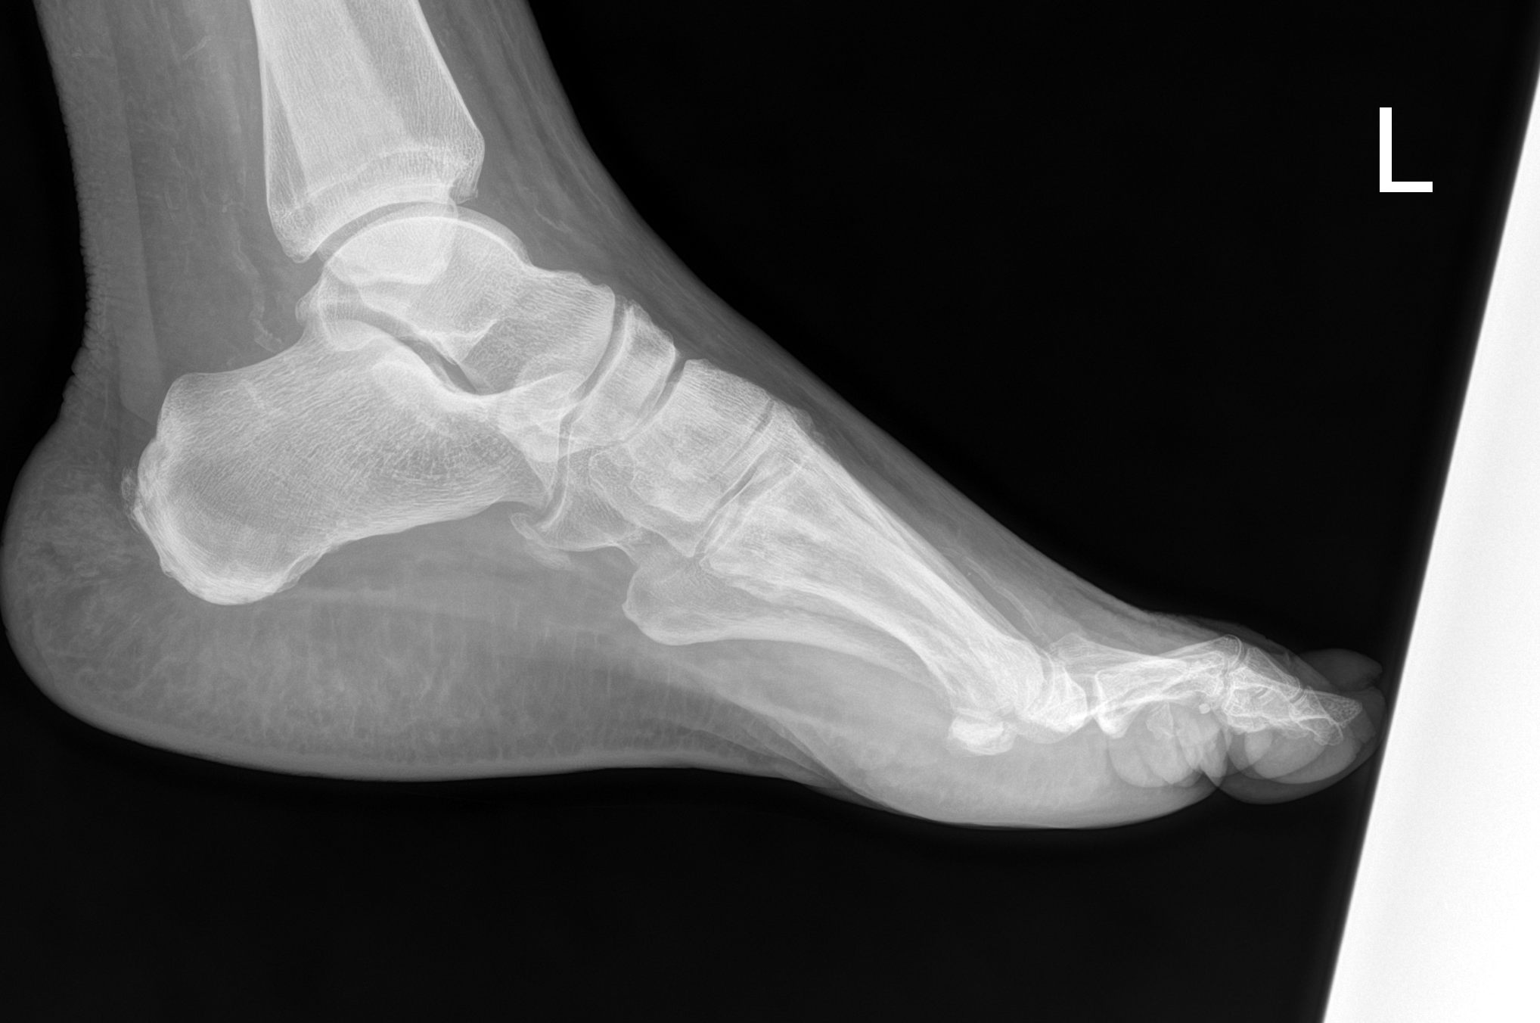

[3 of 3 positions shown; findings below may reference images not displayed]

FINDINGS: There is no evidence of fracture or dislocation. There is no
evidence of arthropathy or other focal bone abnormality. Vascular
calcinosis. Diffuse soft tissue edema about the foot.
IMPRESSION: 1. No fracture or dislocation of the left foot. Joint spaces are
well preserved.
2. Diffuse soft tissue edema about the foot.

## 2023-01-21 ENCOUNTER — Encounter: Payer: Medicare HMO | Admitting: Orthopedic Surgery

## 2023-04-21 IMAGING — DX DG CHEST 1V PORT
1 series · 1 of 1 positions shown · non-contrast
Comparison: 09/17/20

CLINICAL DATA: In stage renal disease. Difficulty swallowing food.
Shortness of breath and left side abdominal pain

EXAM:
PORTABLE CHEST 1 VIEW

[chest ap]
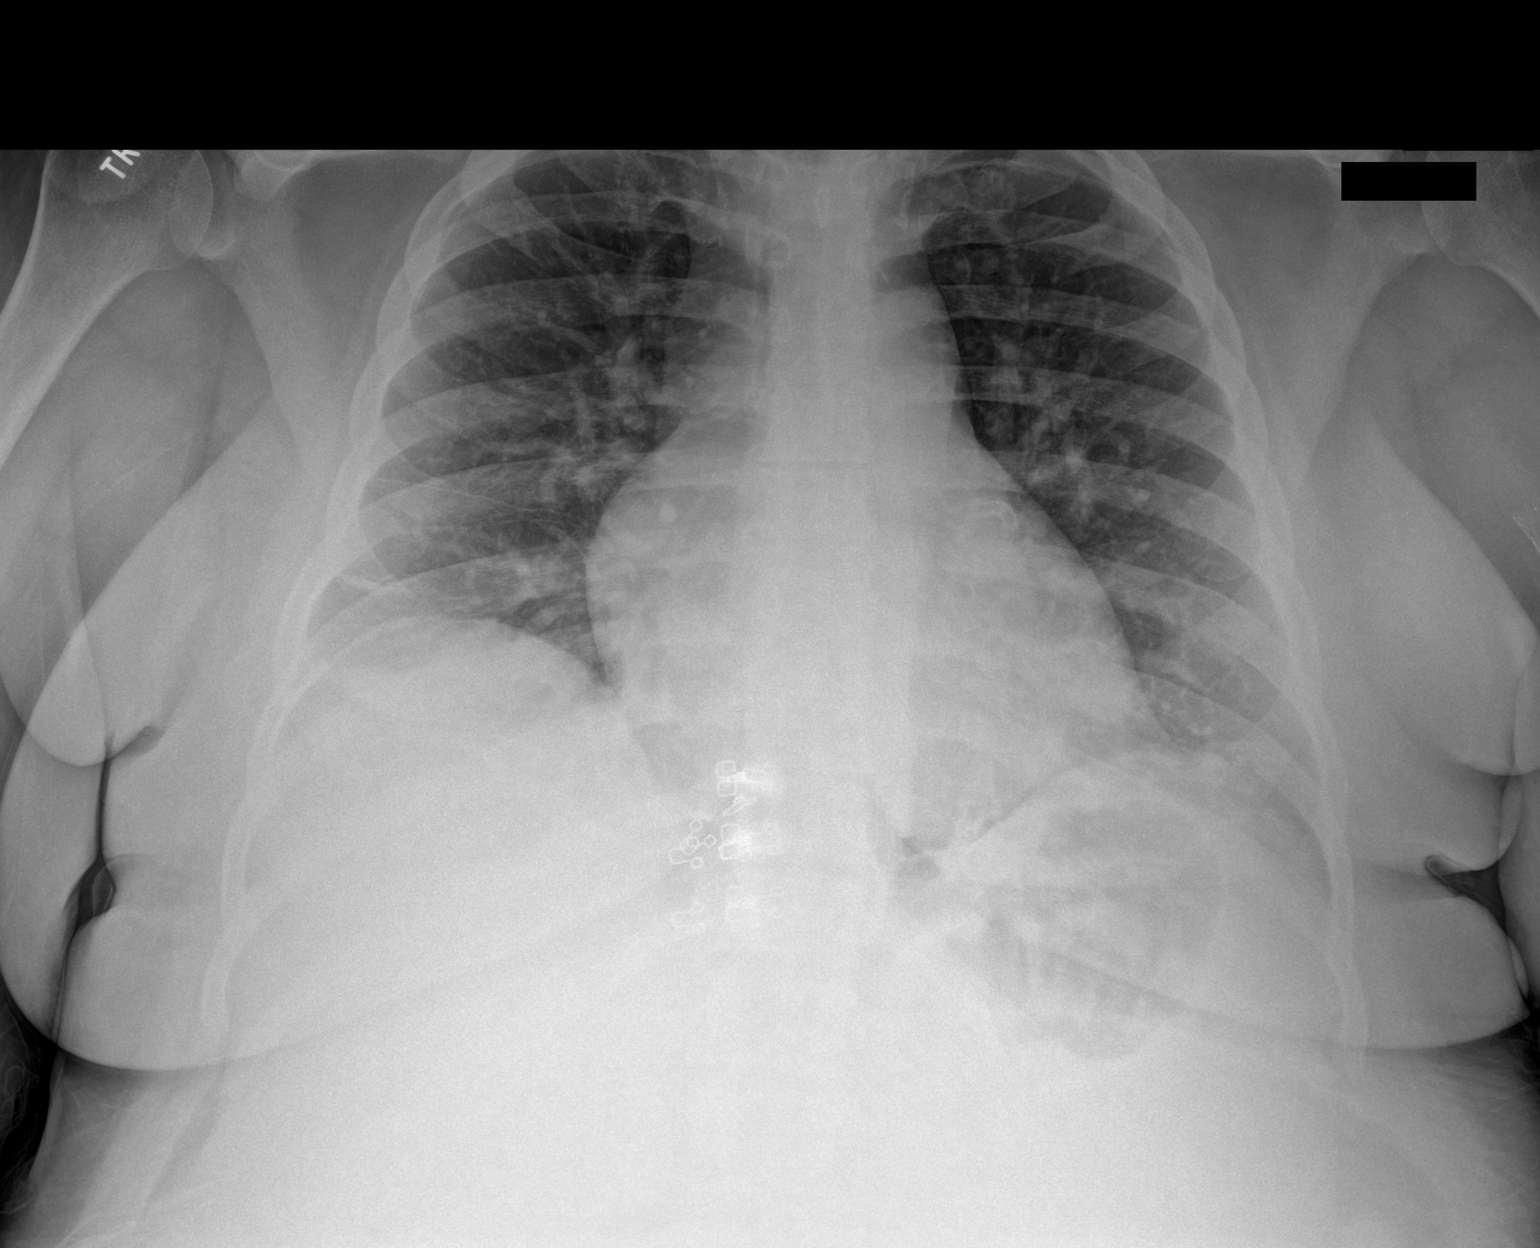

[1 of 1 positions shown; findings below may reference images not displayed]

FINDINGS: Stable cardiomediastinal contours. The pulmonary vascular congestion
without frank edema. Platelike atelectasis versus scar noted within
the right base. No airspace consolidation. Visualized osseous
structures appear intact.
IMPRESSION: 1. Pulmonary vascular congestion.
2. Right base atelectasis versus scar.

## 2023-04-21 IMAGING — CT CT ABD-PELV W/O CM
2 of 4 series · 16 of 46 positions shown, 18 images · non-contrast
Comparison: 12/13/2019

CLINICAL DATA: Acute nonlocalized abdominal pain

EXAM:
CT ABDOMEN AND PELVIS WITHOUT CONTRAST
TECHNIQUE: Multidetector CT imaging of the abdomen and pelvis was performed
following the standard protocol without IV contrast.

[Series 2: routine abd/pel wo · axial · 0.93mm/px · z∈[-431,+44]mm · 13 of 107 slices shown, 15 images]
[im 6/107  soft-tissue]
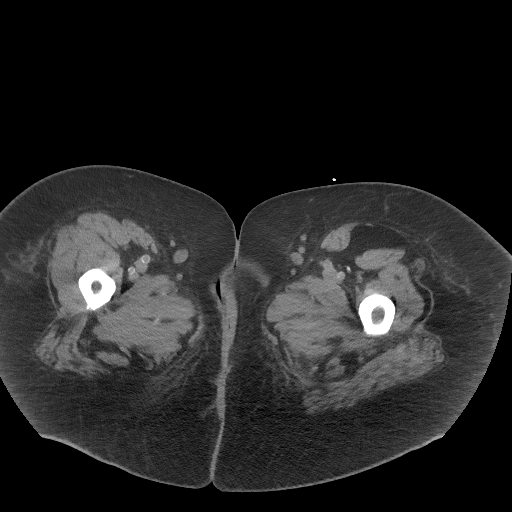
[im 6/107  bone]
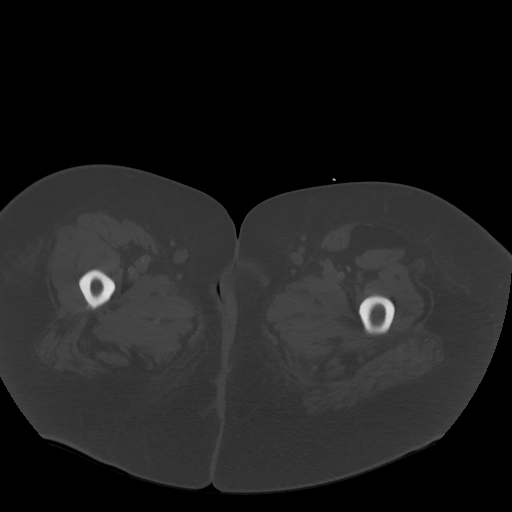
[im 16/107  soft-tissue]
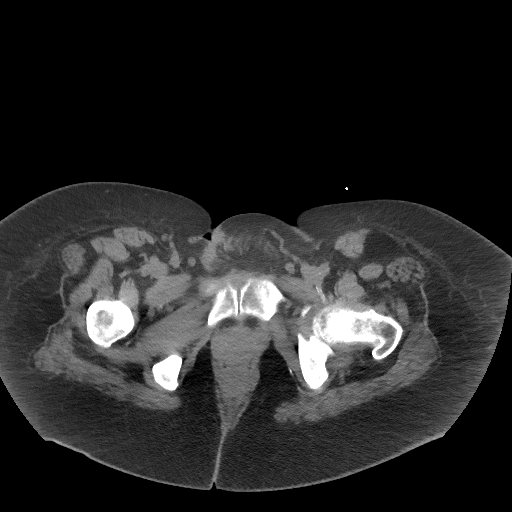
[im 21/107  soft-tissue]
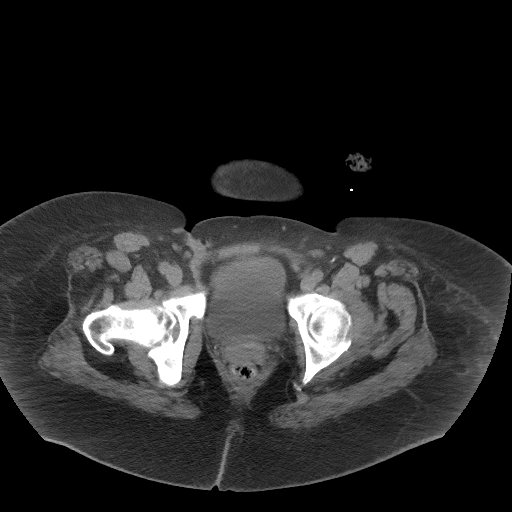
[im 31/107  soft-tissue]
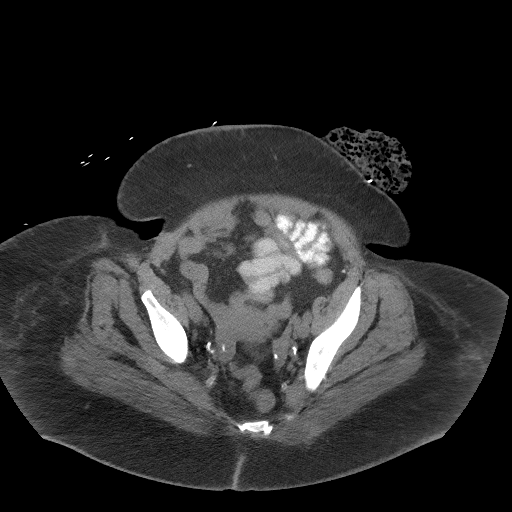
[im 36/107  soft-tissue]
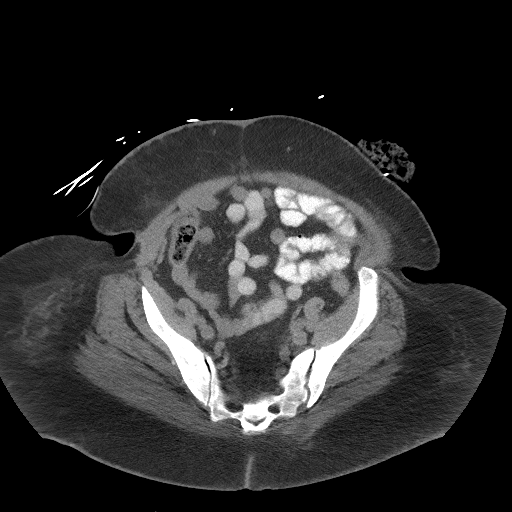
[im 46/107  soft-tissue]
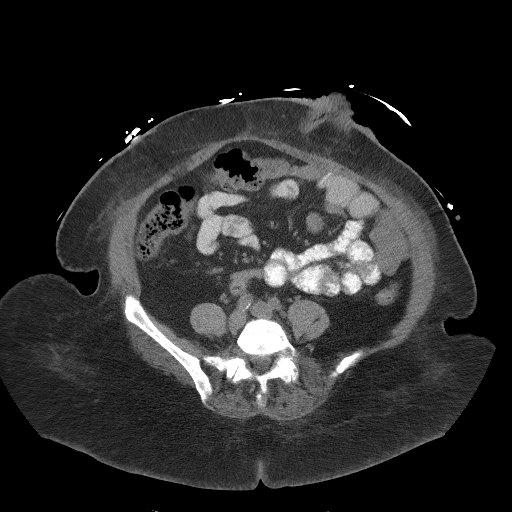
[im 56/107  soft-tissue]
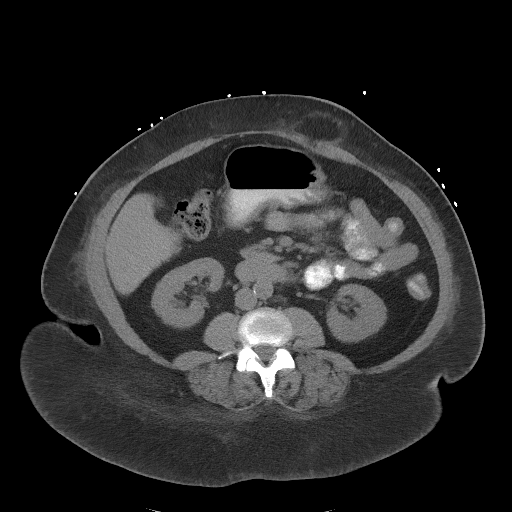
[im 61/107  soft-tissue]
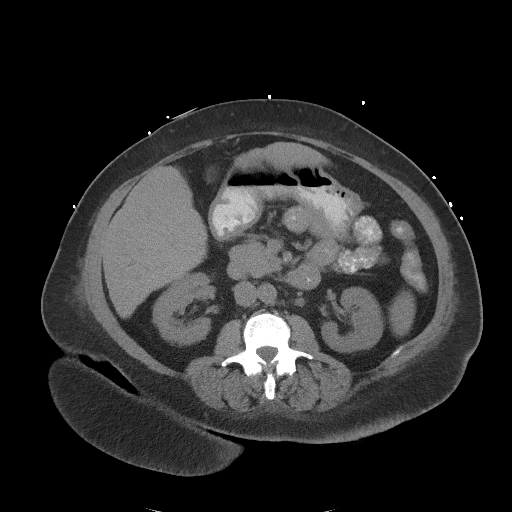
[im 71/107  soft-tissue]
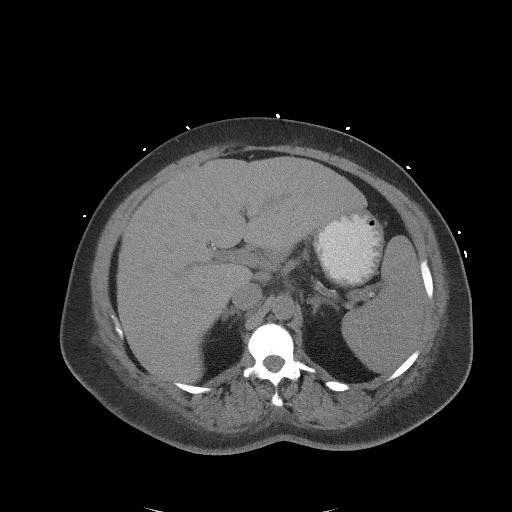
[im 71/107  bone]
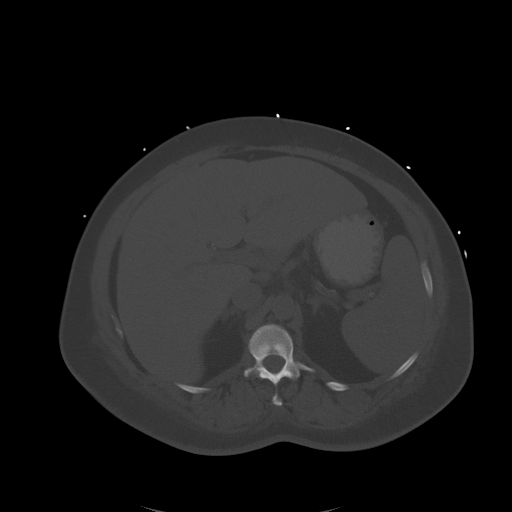
[im 76/107  soft-tissue]
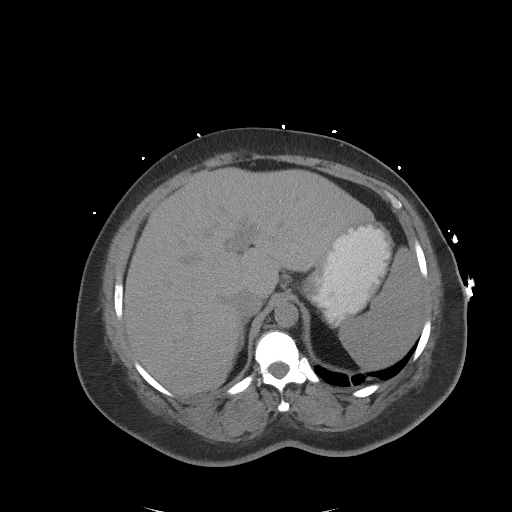
[im 86/107  soft-tissue]
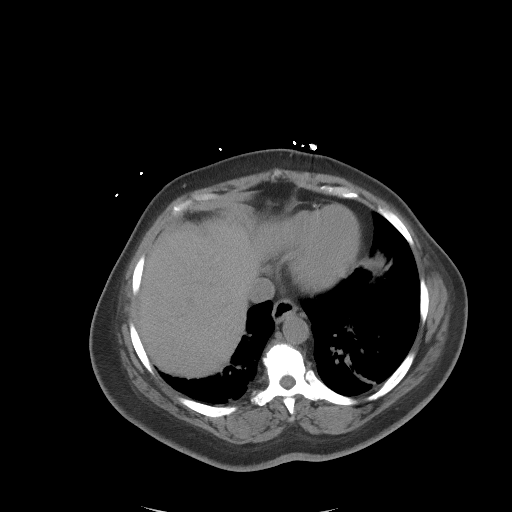
[im 91/107  soft-tissue]
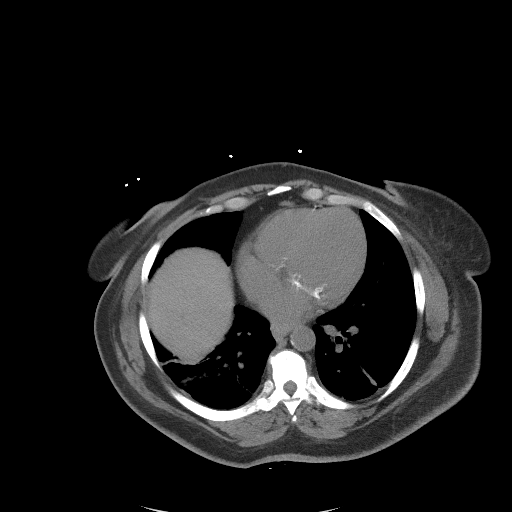
[im 101/107  soft-tissue]
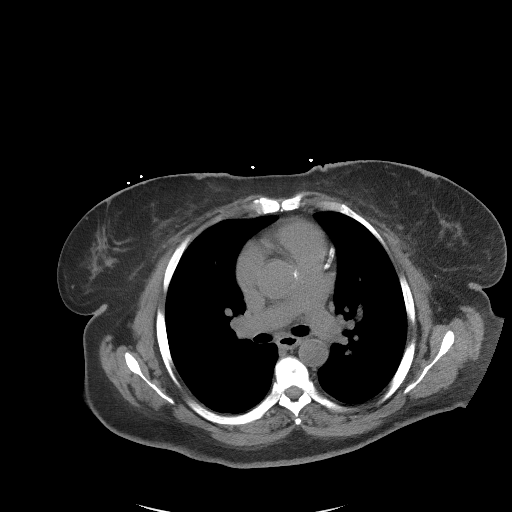

[Series 5: coronal st · coronal · 0.82mm/px · 3 of 116 slices shown]
[im 39/116  soft-tissue]
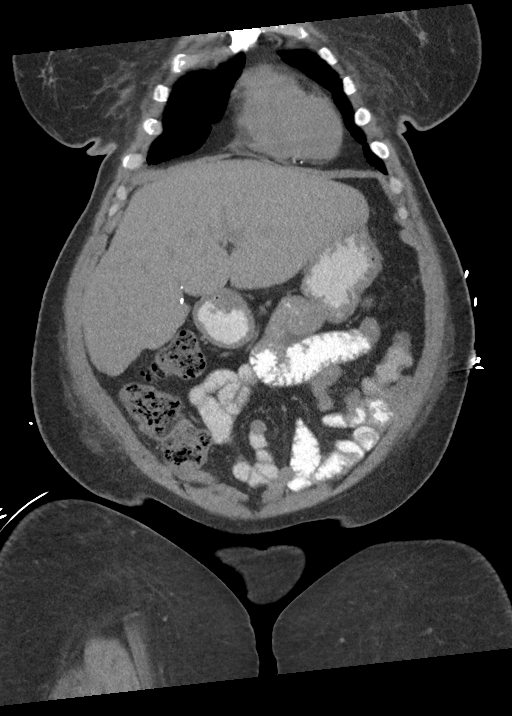
[im 52/116  soft-tissue]
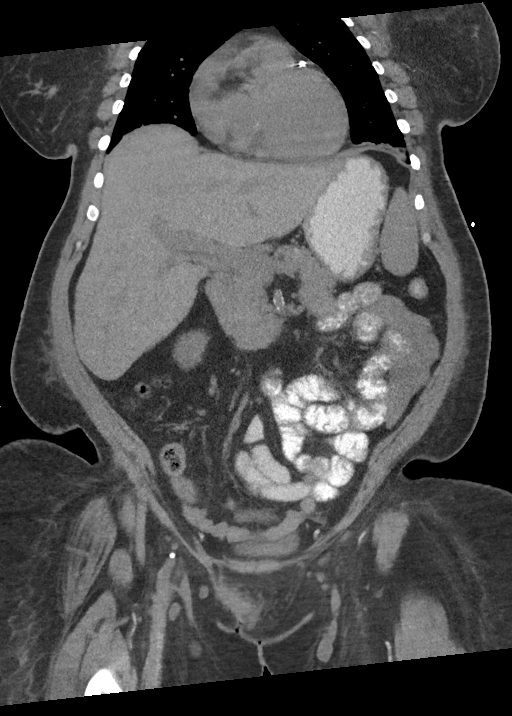
[im 64/116  soft-tissue]
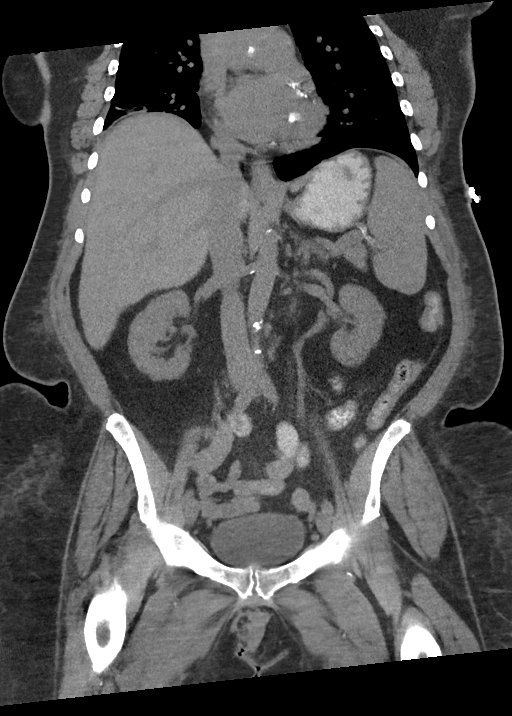

[16 of 46 positions shown; findings below may reference images not displayed]

FINDINGS: Lower chest: Aortic and coronary calcification. Bilateral lower lobe
scarring.

Hepatobiliary: Lobulated liver surface. No focal hepatic
abnormality.Cholecystectomy. No bile duct dilatation.

Pancreas: Unremarkable.

Spleen: Unremarkable.

Adrenals/Urinary Tract: Negative adrenals. No hydronephrosis or
stone. Left renal cystic densities. Unremarkable bladder.

Stomach/Bowel: Transverse colostomy. No obstruction. No visible
inflammation.

Vascular/Lymphatic: No acute vascular abnormality. Atheromatous
calcification of the aorta. No mass or adenopathy.

Reproductive:Band of subcutaneous opacity with skin cleft over the
right groin and mons pubis which has a scar-like appearance.

Other: No ascites or pneumoperitoneum.

Musculoskeletal: No acute abnormalities. Left rectus femoris lipoma
with simple appearance. Diffuse thoracic disc space narrowing and
endplate degeneration. Generalized lumbar spine degeneration.
IMPRESSION: 1. No acute finding.  No bowel obstruction or visible inflammation.
2. Lobulated liver, please correlate for cirrhosis risk factors.
3. Bilateral lower lobe scarring.
4. Scar-like appearance to the right groin/mons pubis since 3532,
please correlate for interval surgery.

## 2023-04-29 ENCOUNTER — Emergency Department (HOSPITAL_COMMUNITY)
Admission: EM | Admit: 2023-04-29 | Discharge: 2023-04-29 | Disposition: A | Discharge status: Home / Self Care | Payer: Medicare HMO | Attending: Emergency Medicine | Admitting: Emergency Medicine

## 2023-04-29 ENCOUNTER — Other Ambulatory Visit: Payer: Self-pay

## 2023-04-29 ENCOUNTER — Encounter (HOSPITAL_COMMUNITY): Payer: Self-pay | Admitting: Emergency Medicine

## 2023-04-29 DIAGNOSIS — Z7982 Long term (current) use of aspirin: Secondary | ICD-10-CM | POA: Diagnosis not present

## 2023-04-29 DIAGNOSIS — G8929 Other chronic pain: Secondary | ICD-10-CM | POA: Insufficient documentation

## 2023-04-29 DIAGNOSIS — M25561 Pain in right knee: Secondary | ICD-10-CM | POA: Insufficient documentation

## 2023-04-29 MED ORDER — OXYCODONE-ACETAMINOPHEN 10-325 MG PO TABS
1.0000 | ORAL_TABLET | Freq: Four times a day (QID) | ORAL | 0 refills | Status: DC | PRN
Start: 2023-04-29 — End: 2024-03-05

## 2023-04-29 MED ORDER — HYDROMORPHONE HCL 1 MG/ML IJ SOLN
1.0000 mg | Freq: Once | INTRAMUSCULAR | Status: AC
  Administered 2023-04-29: 1 mg via INTRAMUSCULAR
  Filled 2023-04-29: qty 1

## 2023-04-29 NOTE — Discharge Instructions (Signed)
Return for any problem.  ?

## 2023-04-29 NOTE — ED Triage Notes (Signed)
Pt reports bilateral knee pain. Pt states she has hx of arthritis and is awaiting knee replacement. Pt unable to control the pain at home.

## 2023-04-29 NOTE — ED Provider Notes (Signed)
[place] EMERGENCY DEPARTMENT AT [facility] Provider Note   CSN: [id number] Arrival date & time: 04/29/23  1104     History  Chief Complaint  Patient presents with   Knee Pain    Adell TALAJAH SLIMP is a 67 y.o. female.  67 year old female with prior medical history as detailed below presents for evaluation.  Patient with longstanding bilateral chronic knee pain.  Patient has been told by orthopedics that she would likely benefit from a total knee replacement.  Patient reports that she has used Percocet 10 mg tablets for quite some time to control her pain.  She reports that over the last several days she has had less improvement in control of her chronic pain.  She reports that she is down to the her last 8 tablets of Percocet.  She does have an established PCP who she follows with and has a pain contract with.  She has established outpatient orthopedic care.  She denies recent fever, injury, illness, other complaint.  She is on dialysis.  She had a session scheduled for this morning.  She skipped her dialysis session this morning.  She has a scheduled make-up session for tomorrow.   The history is provided by the patient and medical records.       Home Medications Prior to Admission medications   Medication Sig Start Date End Date Taking? Authorizing Provider  albuterol (VENTOLIN HFA) 108 (90 Base) MCG/ACT inhaler Inhale 2 puffs into the lungs every 4 (four) hours as needed for wheezing or shortness of breath. 06/28/20   Margaretann Loveless, PA-C  aspirin 81 MG EC tablet Take 1 tablet (81 mg total) by mouth daily. 09/20/20   Leroy Sea, MD  atorvastatin (LIPITOR) 80 MG tablet Take 1 tablet (80 mg total) by mouth every evening. Patient taking differently: Take 80 mg by mouth daily. 04/24/21   Erasmo Downer, MD  carvedilol (COREG) 12.5 MG tablet Take 1 tablet (12.5 mg total) by mouth 2 (two) times daily with a meal. 09/19/21   Paige, Lucas Mallow, DO   Continuous Blood Gluc Receiver (FREESTYLE LIBRE 14 DAY READER) DEVI To check blood sugar ACHS 09/27/20   Margaretann Loveless, PA-C  Continuous Blood Gluc Sensor (FREESTYLE LIBRE 14 DAY SENSOR) MISC To check blood sugar ACHS; change every 14 days 09/27/20   Margaretann Loveless, PA-C  fluticasone [facility]) 50 MCG/ACT nasal spray Place 2 sprays into both nostrils daily as needed for allergies or rhinitis. 09/28/17   Margaretann Loveless, PA-C  fluticasone furoate-vilanterol (BREO ELLIPTA) 200-25 MCG/INH AEPB Inhale 1 puff into the lungs daily. 08/30/20   Margaretann Loveless, PA-C  Glucose Blood (BLOOD GLUCOSE TEST STRIPS 333) STRP Enough test stripes to test blood sugar TID x 30 days. No refills 04/16/22   Charise Killian, MD  HUMULIN 70/30 KWIKPEN (70-30) 100 UNIT/ML KwikPen Inject 30 Units into the skin 2 (two) times daily. 05/26/22   [provider]  hydrOXYzine (ATARAX) 25 MG tablet Take 1 tablet (25 mg total) by mouth 2 (two) times daily. 11/17/22   Hongalgi, Maximino Greenland, MD  Insulin Pen Needle 32G X 4 MM MISC Enough pen needles for 100 units of novolin QAM x 30 days. No refills 04/16/22   Charise Killian, MD  LEVEMIR FLEXPEN 100 UNIT/ML FlexPen Inject 25 Units into the skin daily. 11/02/22   Nadara Mustard, MD  methocarbamol (ROBAXIN) 500 MG tablet Take 1 tablet (500 mg total) by mouth 2 (two) times  daily. 11/02/22   Nadara Mustard, MD  midodrine (PROAMATINE) 10 MG tablet Take 10 mg by mouth See admin instructions. Take 10 mg by mouth 30 minutes before dialysis on Tues/Thurs/Sat 12/23/21   [provider]  naloxone [facility]) nasal spray 4 mg/0.1 mL Place 1 spray into the nose as directed. 04/08/22   [provider]  nitroGLYCERIN (NITRODUR - DOSED IN MG/24 HR) 0.2 mg/hr patch Place 1 patch (0.2 mg total) onto the skin daily. Apply a patch to the dorsum of the left ankle.  Change location daily. 11/17/22   Hongalgi, Maximino Greenland, MD  nitroGLYCERIN (NITRODUR - DOSED IN MG/24 HR) 0.2  mg/hr patch Place 1 patch (0.2 mg total) onto the skin daily. Apply 1 patch to the dorsum of the ankle and change location daily.  Wash ankle and foot with soap and water daily dry before applying patch. 12/24/22 12/24/23  Nadara Mustard, MD  nitroGLYCERIN (NITROSTAT) 0.4 MG SL tablet Place 1 tablet (0.4 mg total) under the tongue every 5 (five) minutes x 3 doses as needed for chest pain. 05/03/20   Margaretann Loveless, PA-C  Oxycodone HCl 10 MG TABS Take 10 mg by mouth 4 (four) times daily. 04/01/22   [provider]  pantoprazole (PROTONIX) 40 MG tablet Take 40 mg by mouth daily before breakfast.    [provider]  pregabalin (LYRICA) 100 MG capsule Take 1 capsule (100 mg total) by mouth 2 (two) times daily. 12/24/22   Nadara Mustard, MD  torsemide (DEMADEX) 10 MG tablet Take 10 mg by mouth See admin instructions. Take 10 mg by mouth every other morning    [provider]  valACYclovir (VALTREX) 500 MG tablet TAKE (1) TABLET BY MOUTH EVERY OTHER DAY. Patient taking differently: Take 500 mg by mouth daily. 01/24/21   Bacigalupo, Marzella Schlein, MD  VELPHORO 500 MG chewable tablet Chew 1,500 mg by mouth 3 (three) times daily. 09/11/21   [provider]  gabapentin (NEURONTIN) 100 MG capsule Take 1 capsule (100 mg total) by mouth 3 (three) times daily. 08/19/17 02/03/19  Enedina Finner, MD      Allergies    Shellfish allergy, Diazepam, and Morphine    Review of Systems   Review of Systems  All other systems reviewed and are negative.   Physical Exam Updated Vital Signs BP (!) 146/55 (BP Location: Right Arm)   Pulse 78   Temp 97.8 F (36.6 C) (Oral)   Resp 16   SpO2 95%  Physical Exam Vitals and nursing note reviewed.  Constitutional:      General: She is not in acute distress.    Appearance: Normal appearance. She is well-developed.  HENT:     Head: Normocephalic and atraumatic.  Eyes:     Conjunctiva/sclera: Conjunctivae normal.     Pupils: Pupils are equal,  round, and reactive to light.  Cardiovascular:     Rate and Rhythm: Normal rate and regular rhythm.     Heart sounds: Normal heart sounds.  Pulmonary:     Effort: Pulmonary effort is normal. No respiratory distress.     Breath sounds: Normal breath sounds.  Abdominal:     General: There is no distension.     Palpations: Abdomen is soft.     Tenderness: There is no abdominal tenderness.  Musculoskeletal:        General: No deformity. Normal range of motion.     Cervical back: Normal range of motion and neck supple.  Comments: Localizes pain to the anterior aspects of both knees.  No appreciable erythema, joint effusion bilaterally.  Skin:    General: Skin is warm and dry.  Neurological:     General: No focal deficit present.     Mental Status: She is alert and oriented to person, place, and time.     ED Results / Procedures / Treatments   Labs (all labs ordered are listed, but only abnormal results are displayed) Labs Reviewed - No data to display  EKG None  Radiology No results found.  Procedures Procedures    Medications Ordered in ED Medications  HYDROmorphone (DILAUDID) injection 1 mg (1 mg Intramuscular Given 04/29/23 1308)    ED Course/ Medical Decision Making/ A&P                                 Medical Decision Making Risk Prescription drug management.    Medical Screen Complete  This patient presented to the ED with complaint of chronic bilateral knee pain.  This complaint involves an extensive number of treatment options. The initial differential diagnosis includes, but is not limited to, chronic pain  This presentation is: Chronic, Self-Limited, Previously Undiagnosed, and Uncertain Prognosis  Patient with longstanding chronic bilateral knee pain.  Patient reports that she has been using excessive amounts of Percocet for pain.  She is reporting that she only has 8 tablets left.  She is due for refill on the 28th.  She denies any specific  other complaint.  After IM dose of Dilaudid she feels significantly improved.  Patient given prescription for small number of Percocet.  She is advised to contact her prescribing PCP in order to avoid endangering her pain contract with same PCP.  Patient did miss dialysis this morning.  She has a make-up session already scheduled for tomorrow.  She is strongly encouraged to make sure that she goes to dialysis tomorrow.  Additional history obtained:  External records from outside sources obtained and reviewed including prior ED visits and prior Inpatient records.  Problem List / ED Course:  Chronic bilateral knee pain   Reevaluation:  After the interventions noted above, I reevaluated the patient and found that they have: improved   Disposition:  After consideration of the diagnostic results and the patients response to treatment, I feel that the patent would benefit from close outpatient follow-up.          Final Clinical Impression(s) / ED Diagnoses Final diagnoses:  Chronic pain of both knees    Rx / DC Orders ED Discharge Orders     None         Wynetta Fines, MD 04/29/23 1635

## 2023-05-02 NOTE — Plan of Care (Signed)
[facility] Tonsillectomy/Adenoidectomy, Postoperative PEDS care plan entered in error.

## 2023-06-23 IMAGING — CR DG CHEST 2V
2 series · 2 of 2 positions shown · non-contrast
Comparison: 01/01/2021

CLINICAL DATA: Weakness.  Back pain.

EXAM:
CHEST - 2 VIEW

[w chest lat]
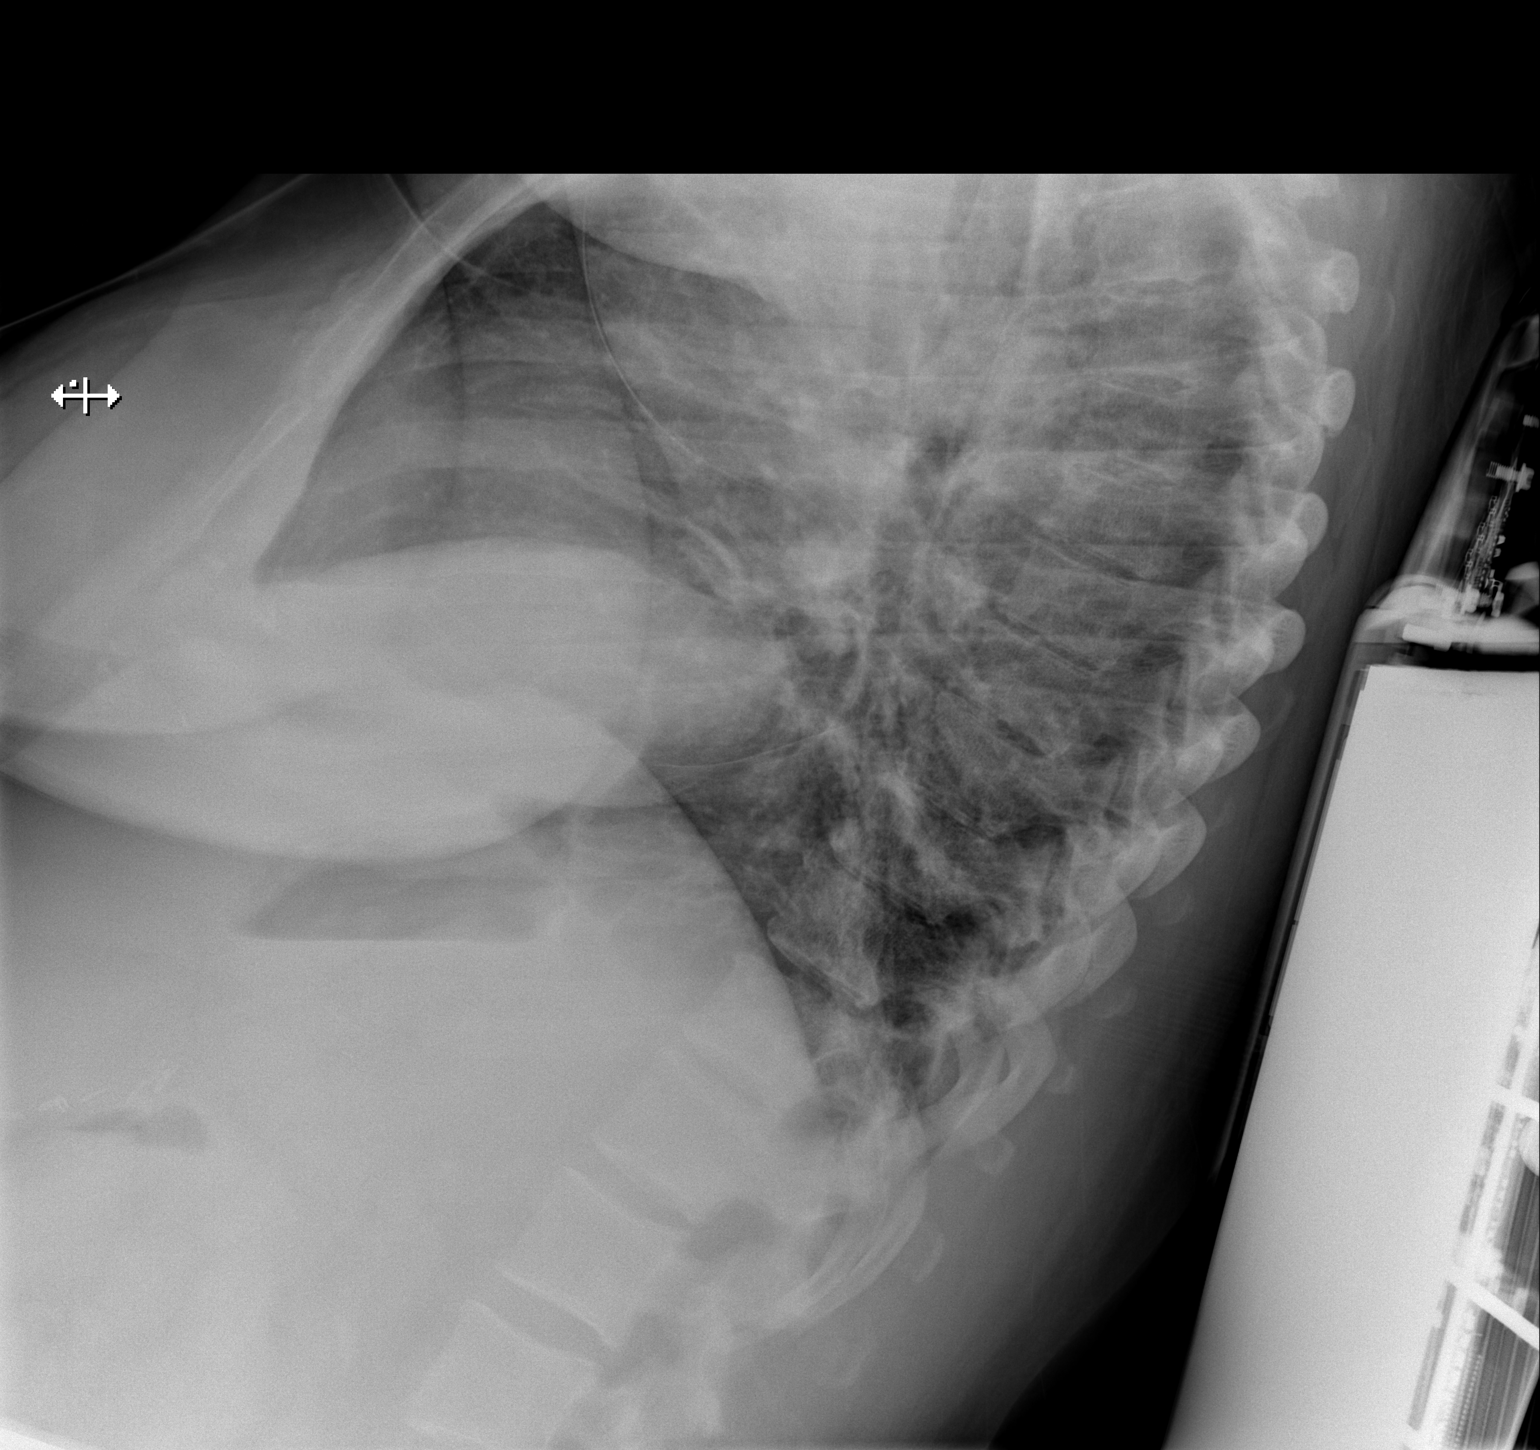

[x chest ap]
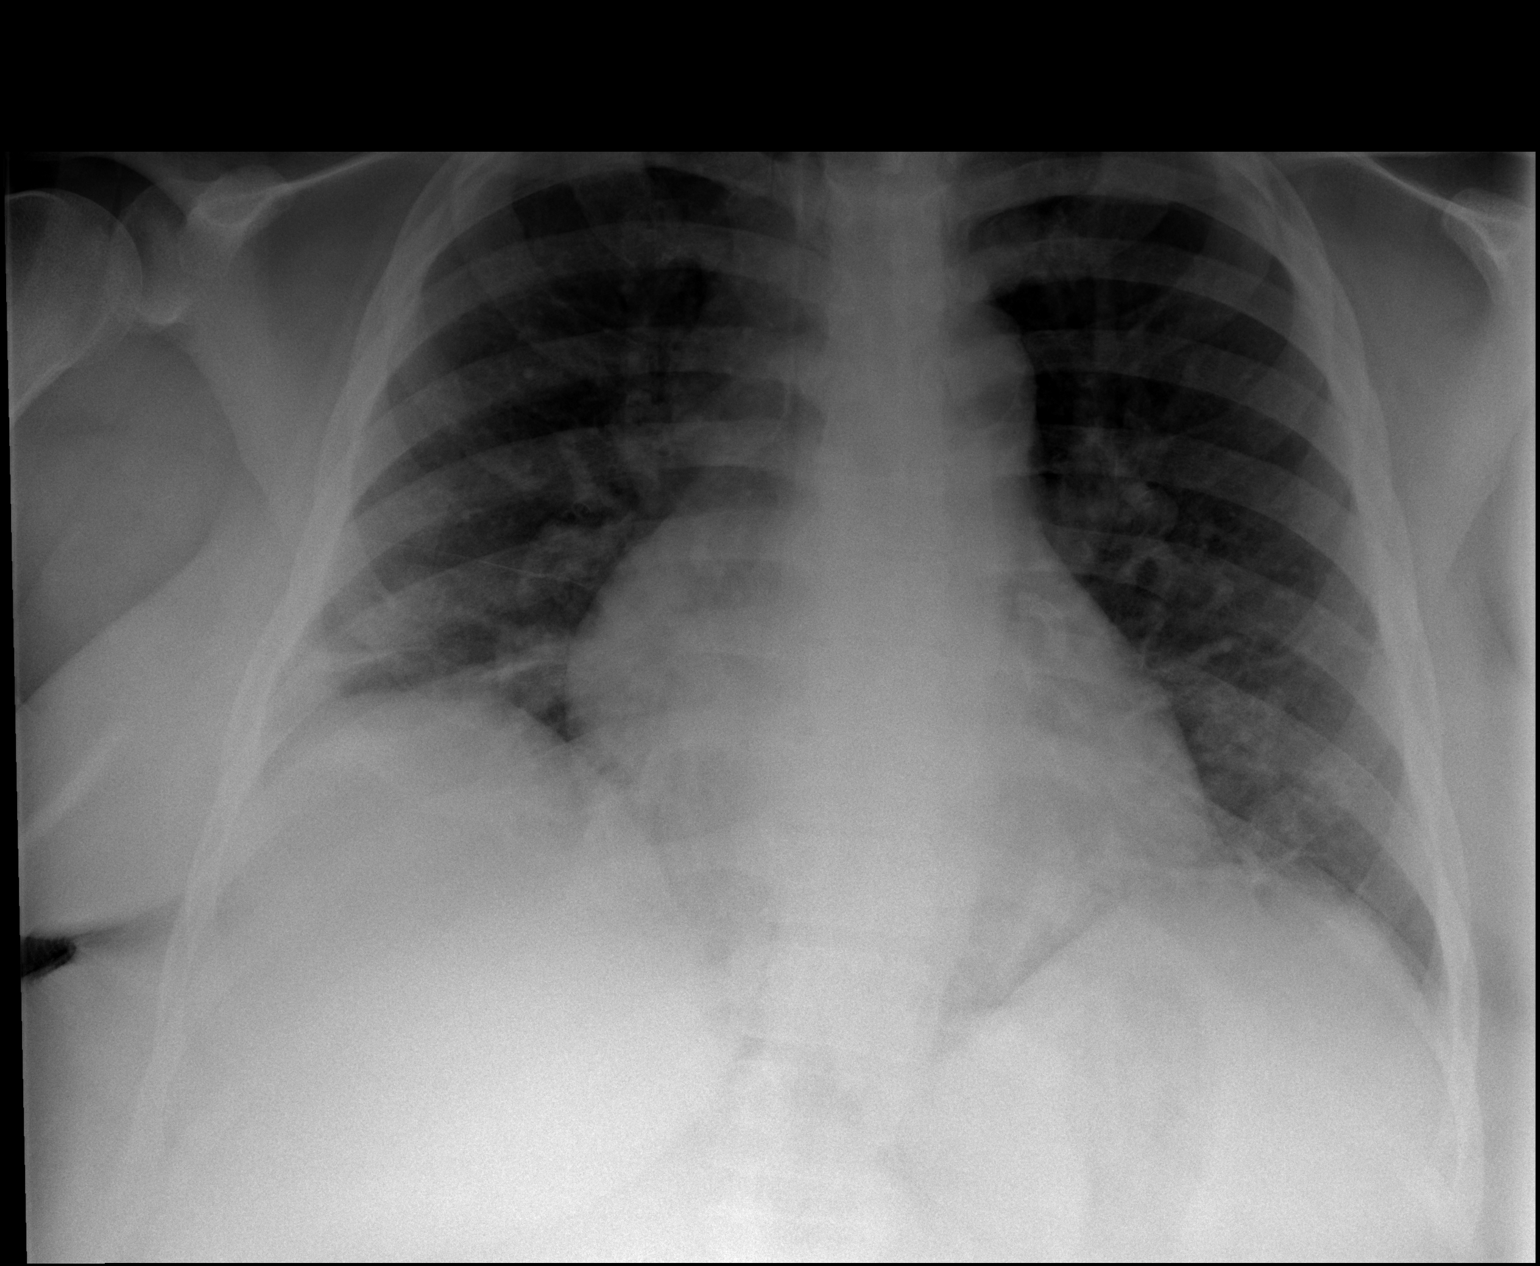

[2 of 2 positions shown; findings below may reference images not displayed]

FINDINGS: Mild cardiac enlargement appears unchanged. No pleural effusion or
edema. Bibasilar atelectasis versus scar appears unchanged. No
superimposed airspace consolidation. No acute osseous findings.
IMPRESSION: Bibasilar atelectasis versus scar.

## 2023-06-25 ENCOUNTER — Emergency Department (HOSPITAL_COMMUNITY)
Admission: EM | Admit: 2023-06-25 | Discharge: 2023-06-25 | Disposition: A | Discharge status: Home / Self Care | Payer: 59 | Attending: Emergency Medicine | Admitting: Emergency Medicine

## 2023-06-25 ENCOUNTER — Other Ambulatory Visit: Payer: Self-pay

## 2023-06-25 DIAGNOSIS — Z7982 Long term (current) use of aspirin: Secondary | ICD-10-CM | POA: Diagnosis not present

## 2023-06-25 DIAGNOSIS — M25551 Pain in right hip: Secondary | ICD-10-CM | POA: Insufficient documentation

## 2023-06-25 DIAGNOSIS — M545 Low back pain, unspecified: Secondary | ICD-10-CM | POA: Diagnosis not present

## 2023-06-25 DIAGNOSIS — Z794 Long term (current) use of insulin: Secondary | ICD-10-CM | POA: Diagnosis not present

## 2023-06-25 LAB — CBC WITH DIFFERENTIAL/PLATELET
Abs Immature Granulocytes: 0.06 10*3/uL (ref 0.00–0.07)
Basophils Absolute: 0 10*3/uL (ref 0.0–0.1)
Basophils Relative: 1 %
Eosinophils Absolute: 0.2 10*3/uL (ref 0.0–0.5)
Eosinophils Relative: 3 %
HCT: 30.8 % — ABNORMAL LOW (ref 36.0–46.0)
Hemoglobin: 9.2 g/dL — ABNORMAL LOW (ref 12.0–15.0)
Immature Granulocytes: 1 %
Lymphocytes Relative: 21 %
Lymphs Abs: 1.3 10*3/uL (ref 0.7–4.0)
MCH: 25.1 pg — ABNORMAL LOW (ref 26.0–34.0)
MCHC: 29.9 g/dL — ABNORMAL LOW (ref 30.0–36.0)
MCV: 84.2 fL (ref 80.0–100.0)
Monocytes Absolute: 0.5 10*3/uL (ref 0.1–1.0)
Monocytes Relative: 8 %
Neutro Abs: 4.2 10*3/uL (ref 1.7–7.7)
Neutrophils Relative %: 66 %
Platelets: 158 10*3/uL (ref 150–400)
RBC: 3.66 MIL/uL — ABNORMAL LOW (ref 3.87–5.11)
RDW: 17.8 % — ABNORMAL HIGH (ref 11.5–15.5)
WBC: 6.3 10*3/uL (ref 4.0–10.5)
nRBC: 0 % (ref 0.0–0.2)

## 2023-06-25 LAB — COMPREHENSIVE METABOLIC PANEL
ALT: 16 U/L (ref 0–44)
AST: 12 U/L — ABNORMAL LOW (ref 15–41)
Albumin: 3.6 g/dL (ref 3.5–5.0)
Alkaline Phosphatase: 155 U/L — ABNORMAL HIGH (ref 38–126)
Anion gap: 16 — ABNORMAL HIGH (ref 5–15)
BUN: 54 mg/dL — ABNORMAL HIGH (ref 8–23)
CO2: 19 mmol/L — ABNORMAL LOW (ref 22–32)
Calcium: 8.5 mg/dL — ABNORMAL LOW (ref 8.9–10.3)
Chloride: 103 mmol/L (ref 98–111)
Creatinine, Ser: 9.66 mg/dL — ABNORMAL HIGH (ref 0.44–1.00)
GFR, Estimated: 4 mL/min — ABNORMAL LOW (ref >60)
Glucose, Bld: 159 mg/dL — ABNORMAL HIGH (ref 70–99)
Potassium: 4.8 mmol/L (ref 3.5–5.1)
Sodium: 138 mmol/L (ref 135–145)
Total Bilirubin: 0.6 mg/dL (ref <1.2)
Total Protein: 6.9 g/dL (ref 6.5–8.1)

## 2023-06-25 MED ORDER — LIDOCAINE 5 % EX PTCH
3.0000 | MEDICATED_PATCH | CUTANEOUS | Status: DC
  Administered 2023-06-25: 3 via TRANSDERMAL
  Filled 2023-06-25: qty 3

## 2023-06-25 MED ORDER — KETOROLAC TROMETHAMINE 60 MG/2ML IM SOLN
60.0000 mg | Freq: Once | INTRAMUSCULAR | Status: AC
Start: 2023-06-25 — End: 2023-06-25
  Administered 2023-06-25: 60 mg via INTRAMUSCULAR
  Filled 2023-06-25: qty 2

## 2023-06-25 MED ORDER — LIDOCAINE 5 % EX PTCH: 1.0000 | MEDICATED_PATCH | CUTANEOUS | 0 refills | Status: DC

## 2023-06-25 NOTE — ED Triage Notes (Signed)
Patient reports increasing pain at right groin for 2 weeks , denies injury.

## 2023-06-25 NOTE — ED Provider Notes (Signed)
[place] EMERGENCY DEPARTMENT AT [facility] Provider Note   CSN: [id number] Arrival date & time: 06/25/23  0019     History  Chief Complaint  Patient presents with   Right Groin Pain     [name] is a 67 y.o. female.  The history is provided by the patient.  Hip Pain The current episode started more than 1 week ago. The problem occurs constantly. The problem has not changed since onset.Pertinent negatives include no chest pain, no abdominal pain, no headaches and no shortness of breath. Nothing aggravates the symptoms. Nothing relieves the symptoms. Treatments tried: home oral opioids. The treatment provided no relief.  Patient with chronic pain on home opioids and ESRD presents with Right hip and groin and low back pain for more than 2 weeks.  Was admitted at OSH on 12/6 and no source was found.  Then went to a second ER and more imaging done without a source.  No f/c/r.  No weakness no swelling.       Home Medications Prior to Admission medications   Medication Sig Start Date End Date Taking? Authorizing Provider  lidocaine (LIDODERM) 5 % Place 1 patch onto the skin daily. Remove & Discard patch within 12 hours or as directed by MD 06/25/23  Yes Bayleigh Loflin, MD  albuterol (VENTOLIN HFA) 108 (90 Base) MCG/ACT inhaler Inhale 2 puffs into the lungs every 4 (four) hours as needed for wheezing or shortness of breath. 06/28/20   Margaretann Loveless, PA-C  aspirin 81 MG EC tablet Take 1 tablet (81 mg total) by mouth daily. 09/20/20   Leroy Sea, MD  atorvastatin (LIPITOR) 80 MG tablet Take 1 tablet (80 mg total) by mouth every evening. Patient taking differently: Take 80 mg by mouth daily. 04/24/21   Erasmo Downer, MD  carvedilol (COREG) 12.5 MG tablet Take 1 tablet (12.5 mg total) by mouth 2 (two) times daily with a meal. 09/19/21   Paige, Lucas Mallow, DO  Continuous Blood Gluc Receiver (FREESTYLE LIBRE 14 DAY READER) DEVI To check blood sugar ACHS  09/27/20   Margaretann Loveless, PA-C  Continuous Blood Gluc Sensor (FREESTYLE LIBRE 14 DAY SENSOR) MISC To check blood sugar ACHS; change every 14 days 09/27/20   Margaretann Loveless, PA-C  fluticasone [facility]) 50 MCG/ACT nasal spray Place 2 sprays into both nostrils daily as needed for allergies or rhinitis. 09/28/17   Margaretann Loveless, PA-C  fluticasone furoate-vilanterol (BREO ELLIPTA) 200-25 MCG/INH AEPB Inhale 1 puff into the lungs daily. 08/30/20   Margaretann Loveless, PA-C  Glucose Blood (BLOOD GLUCOSE TEST STRIPS 333) STRP Enough test stripes to test blood sugar TID x 30 days. No refills 04/16/22   Charise Killian, MD  HUMULIN 70/30 KWIKPEN (70-30) 100 UNIT/ML KwikPen Inject 30 Units into the skin 2 (two) times daily. 05/26/22   [provider]  hydrOXYzine (ATARAX) 25 MG tablet Take 1 tablet (25 mg total) by mouth 2 (two) times daily. 11/17/22   Hongalgi, Maximino Greenland, MD  Insulin Pen Needle 32G X 4 MM MISC Enough pen needles for 100 units of novolin QAM x 30 days. No refills 04/16/22   Charise Killian, MD  LEVEMIR FLEXPEN 100 UNIT/ML FlexPen Inject 25 Units into the skin daily. 11/02/22   Nadara Mustard, MD  methocarbamol (ROBAXIN) 500 MG tablet Take 1 tablet (500 mg total) by mouth 2 (two) times daily. 11/02/22   Nadara Mustard, MD  midodrine (PROAMATINE) 10 MG  tablet Take 10 mg by mouth See admin instructions. Take 10 mg by mouth 30 minutes before dialysis on Tues/Thurs/Sat 12/23/21   [provider]  naloxone [facility]) nasal spray 4 mg/0.1 mL Place 1 spray into the nose as directed. 04/08/22   [provider]  nitroGLYCERIN (NITRODUR - DOSED IN MG/24 HR) 0.2 mg/hr patch Place 1 patch (0.2 mg total) onto the skin daily. Apply a patch to the dorsum of the left ankle.  Change location daily. 11/17/22   Hongalgi, Maximino Greenland, MD  nitroGLYCERIN (NITRODUR - DOSED IN MG/24 HR) 0.2 mg/hr patch Place 1 patch (0.2 mg total) onto the skin daily. Apply 1 patch to the dorsum of the  ankle and change location daily.  Wash ankle and foot with soap and water daily dry before applying patch. 12/24/22 12/24/23  Nadara Mustard, MD  nitroGLYCERIN (NITROSTAT) 0.4 MG SL tablet Place 1 tablet (0.4 mg total) under the tongue every 5 (five) minutes x 3 doses as needed for chest pain. 05/03/20   Margaretann Loveless, PA-C  Oxycodone HCl 10 MG TABS Take 10 mg by mouth 4 (four) times daily. 04/01/22   [provider]  oxyCODONE-acetaminophen (PERCOCET) 10-325 MG tablet Take 1 tablet by mouth every 6 (six) hours as needed for pain. 04/29/23   Wynetta Fines, MD  pantoprazole (PROTONIX) 40 MG tablet Take 40 mg by mouth daily before breakfast.    [provider]  pregabalin (LYRICA) 100 MG capsule Take 1 capsule (100 mg total) by mouth 2 (two) times daily. 12/24/22   Nadara Mustard, MD  torsemide (DEMADEX) 10 MG tablet Take 10 mg by mouth See admin instructions. Take 10 mg by mouth every other morning    [provider]  valACYclovir (VALTREX) 500 MG tablet TAKE (1) TABLET BY MOUTH EVERY OTHER DAY. Patient taking differently: Take 500 mg by mouth daily. 01/24/21   Bacigalupo, Marzella Schlein, MD  VELPHORO 500 MG chewable tablet Chew 1,500 mg by mouth 3 (three) times daily. 09/11/21   [provider]  gabapentin (NEURONTIN) 100 MG capsule Take 1 capsule (100 mg total) by mouth 3 (three) times daily. 08/19/17 02/03/19  Enedina Finner, MD      Allergies    Shellfish allergy, Diazepam, and Morphine    Review of Systems   Review of Systems  Respiratory:  Negative for shortness of breath.   Cardiovascular:  Negative for chest pain.  Gastrointestinal:  Negative for abdominal pain.  Musculoskeletal:  Positive for arthralgias.  Neurological:  Negative for headaches.  All other systems reviewed and are negative.   Physical Exam Updated Vital Signs BP 130/67   Pulse 66   Temp 97.6 F (36.4 C)   Resp 17   Ht 5\' 5"  (1.651 m)   Wt 112.2 kg   SpO2 100%   BMI 41.16 kg/m   Physical Exam Vitals and nursing note reviewed.  Constitutional:      General: She is not in acute distress.    Appearance: She is well-developed.  HENT:     Head: Normocephalic and atraumatic.  Eyes:     Pupils: Pupils are equal, round, and reactive to light.  Cardiovascular:     Rate and Rhythm: Normal rate and regular rhythm.     Pulses: Normal pulses.     Heart sounds: Normal heart sounds.  Pulmonary:     Effort: Pulmonary effort is normal. No respiratory distress.     Breath sounds: Normal breath sounds.  Abdominal:  General: Bowel sounds are normal. There is no distension.     Palpations: Abdomen is soft.     Tenderness: There is no abdominal tenderness. There is no guarding or rebound.  Genitourinary:    Vagina: No vaginal discharge.  Musculoskeletal:        General: No swelling, tenderness or deformity. Normal range of motion.     Cervical back: Neck supple.     Lumbar back: Normal.     Right hip: Normal.     Right upper leg: Normal.     Right knee: Normal.     Right ankle:     Right Achilles Tendon: Normal.     Right foot: Normal.  Skin:    General: Skin is dry.     Capillary Refill: Capillary refill takes less than 2 seconds.     Findings: No erythema or rash.  Neurological:     General: No focal deficit present.     Deep Tendon Reflexes: Reflexes normal.  Psychiatric:        Mood and Affect: Mood normal.     ED Results / Procedures / Treatments   Labs (all labs ordered are listed, but only abnormal results are displayed) Results for orders placed or performed during the hospital encounter of 06/25/23  CBC with Differential   Collection Time: 06/25/23  1:06 AM  Result Value Ref Range   WBC 6.3 4.0 - 10.5 K/uL   RBC 3.66 (L) 3.87 - 5.11 MIL/uL   Hemoglobin 9.2 (L) 12.0 - 15.0 g/dL   HCT 86.5 (L) 78.4 - 69.6 %   MCV 84.2 80.0 - 100.0 fL   MCH 25.1 (L) 26.0 - 34.0 pg   MCHC 29.9 (L) 30.0 - 36.0 g/dL   RDW 29.5 (H) 28.4 - 13.2 %   Platelets 158  150 - 400 K/uL   nRBC 0.0 0.0 - 0.2 %   Neutrophils Relative % 66 %   Neutro Abs 4.2 1.7 - 7.7 K/uL   Lymphocytes Relative 21 %   Lymphs Abs 1.3 0.7 - 4.0 K/uL   Monocytes Relative 8 %   Monocytes Absolute 0.5 0.1 - 1.0 K/uL   Eosinophils Relative 3 %   Eosinophils Absolute 0.2 0.0 - 0.5 K/uL   Basophils Relative 1 %   Basophils Absolute 0.0 0.0 - 0.1 K/uL   Immature Granulocytes 1 %   Abs Immature Granulocytes 0.06 0.00 - 0.07 K/uL  Comprehensive metabolic panel   Collection Time: 06/25/23  1:06 AM  Result Value Ref Range   Sodium 138 135 - 145 mmol/L   Potassium 4.8 3.5 - 5.1 mmol/L   Chloride 103 98 - 111 mmol/L   CO2 19 (L) 22 - 32 mmol/L   Glucose, Bld 159 (H) 70 - 99 mg/dL   BUN 54 (H) 8 - 23 mg/dL   Creatinine, Ser 4.40 (H) 0.44 - 1.00 mg/dL   Calcium 8.5 (L) 8.9 - 10.3 mg/dL   Total Protein 6.9 6.5 - 8.1 g/dL   Albumin 3.6 3.5 - 5.0 g/dL   AST 12 (L) 15 - 41 U/L   ALT 16 0 - 44 U/L   Alkaline Phosphatase 155 (H) 38 - 126 U/L   Total Bilirubin 0.6 <1.2 mg/dL   GFR, Estimated 4 (L) >60 mL/min   Anion gap 16 (H) 5 - 15   *Note: Due to a large number of results and/or encounters for the requested time period, some results have not been displayed. A complete set of results  can be found in Results Review.   No results found.  EKG None  Radiology No results found.  Procedures Procedures    Medications Ordered in ED Medications  lidocaine (LIDODERM) 5 % 3 patch (3 patches Transdermal Patch Applied 06/25/23 0344)  ketorolac (TORADOL) injection 60 mg (60 mg Intramuscular Given 06/25/23 0344)    ED Course/ Medical Decision Making/ A&P                                 Medical Decision Making Patient with ongoing hip and low back and groin pain   Amount and/or Complexity of Data Reviewed External Data Reviewed: labs, radiology and notes.    Details: Previous outside notes labs and imaging reviewed  Labs: ordered.    Details: Normal white count 6.3, low  hemoglobin 9.2, normal platelets 158.  Normal sodium 138, normal potassium 4.8, elevated creatinine 9.66 (on dialysis)  Risk Prescription drug management. Risk Details: No trauma.  Multiple imaging studies without cause.  No rash FROM.  Normal pulses in the B feet.  Patient will need to follow up with PMD, pain management and orthopedics for ongoing care.  Stable for discharge.  Strict returns     Final Clinical Impression(s) / ED Diagnoses Final diagnoses:  Pain of right hip  Right low back pain, unspecified chronicity, unspecified whether sciatica present   Return for intractable cough, coughing up blood, fevers > 100.4 unrelieved by medication, shortness of breath, intractable vomiting, chest pain, shortness of breath, weakness, numbness, changes in speech, facial asymmetry, abdominal pain, passing out, Inability to tolerate liquids or food, cough, altered mental status or any concerns. No signs of systemic illness or infection. The patient is nontoxic-appearing on exam and vital signs are within normal limits.  I have reviewed the triage vital signs and the nursing notes. Pertinent labs & imaging results that were available during my care of the patient were reviewed by me and considered in my medical decision making (see chart for details). After history, exam, and medical workup I feel the patient has been appropriately medically screened and is safe for discharge home. Pertinent diagnoses were discussed with the patient. Patient was given return precautions.  Rx / DC Orders ED Discharge Orders          Ordered    lidocaine (LIDODERM) 5 %  Every 24 hours        06/25/23 0343              Dodie Parisi, MD 06/25/23 6644

## 2023-07-12 ENCOUNTER — Other Ambulatory Visit: Payer: Self-pay | Admitting: Orthopedic Surgery

## 2023-07-12 IMAGING — CR DG KNEE COMPLETE 4+V*L*
4 series · 4 of 4 positions shown · non-contrast
Comparison: 01/31/2020

CLINICAL DATA: Pain and decreased mobility.

EXAM:
LEFT KNEE - COMPLETE 4+ VIEW

[t knee ap left]
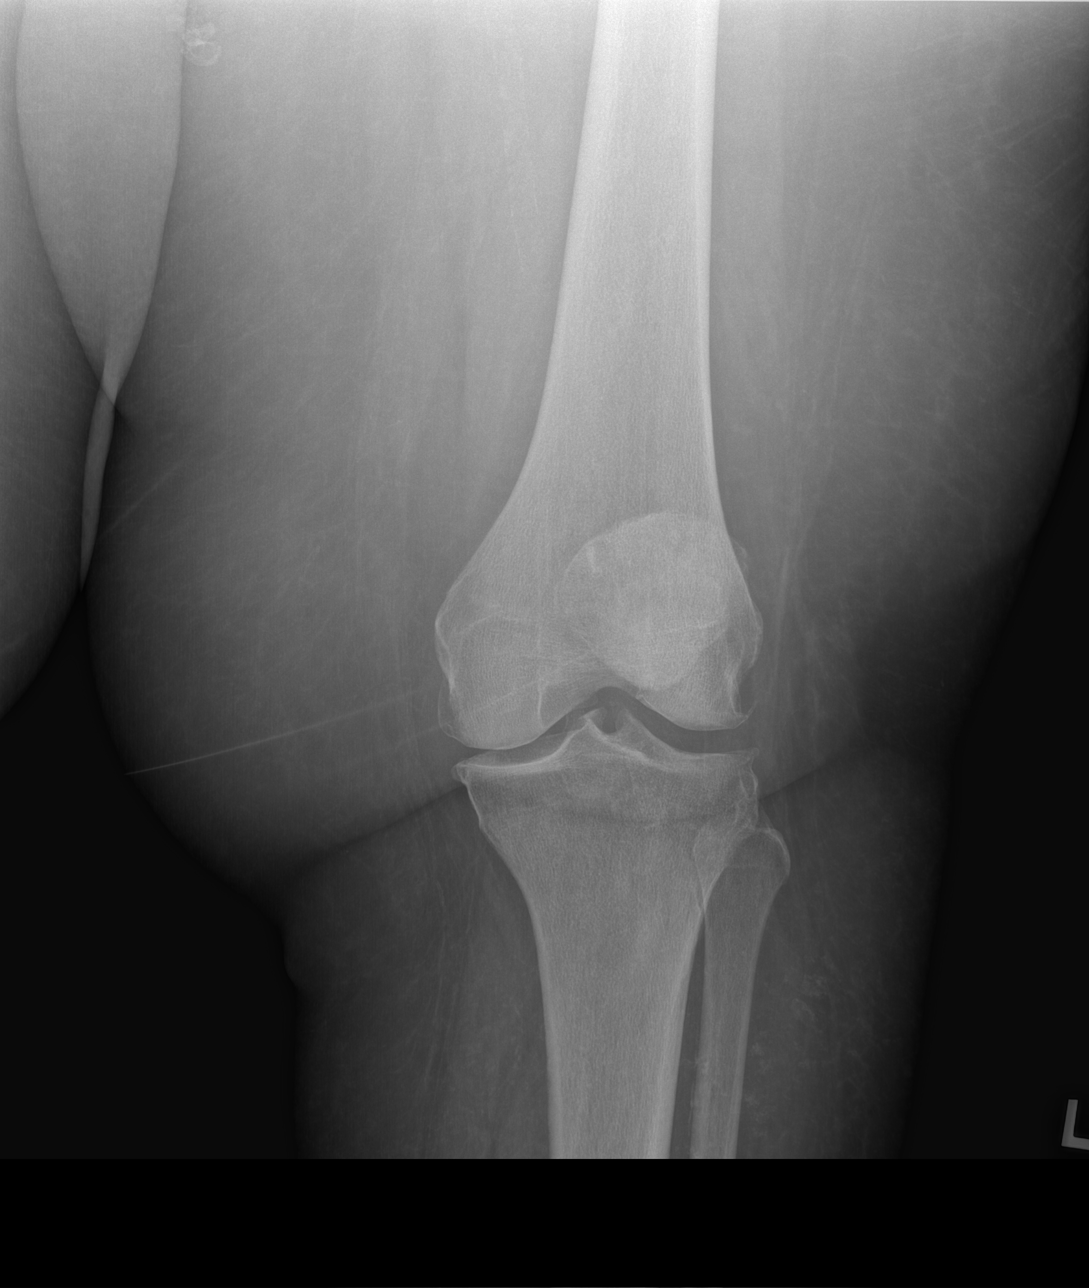

[t knee obl left]
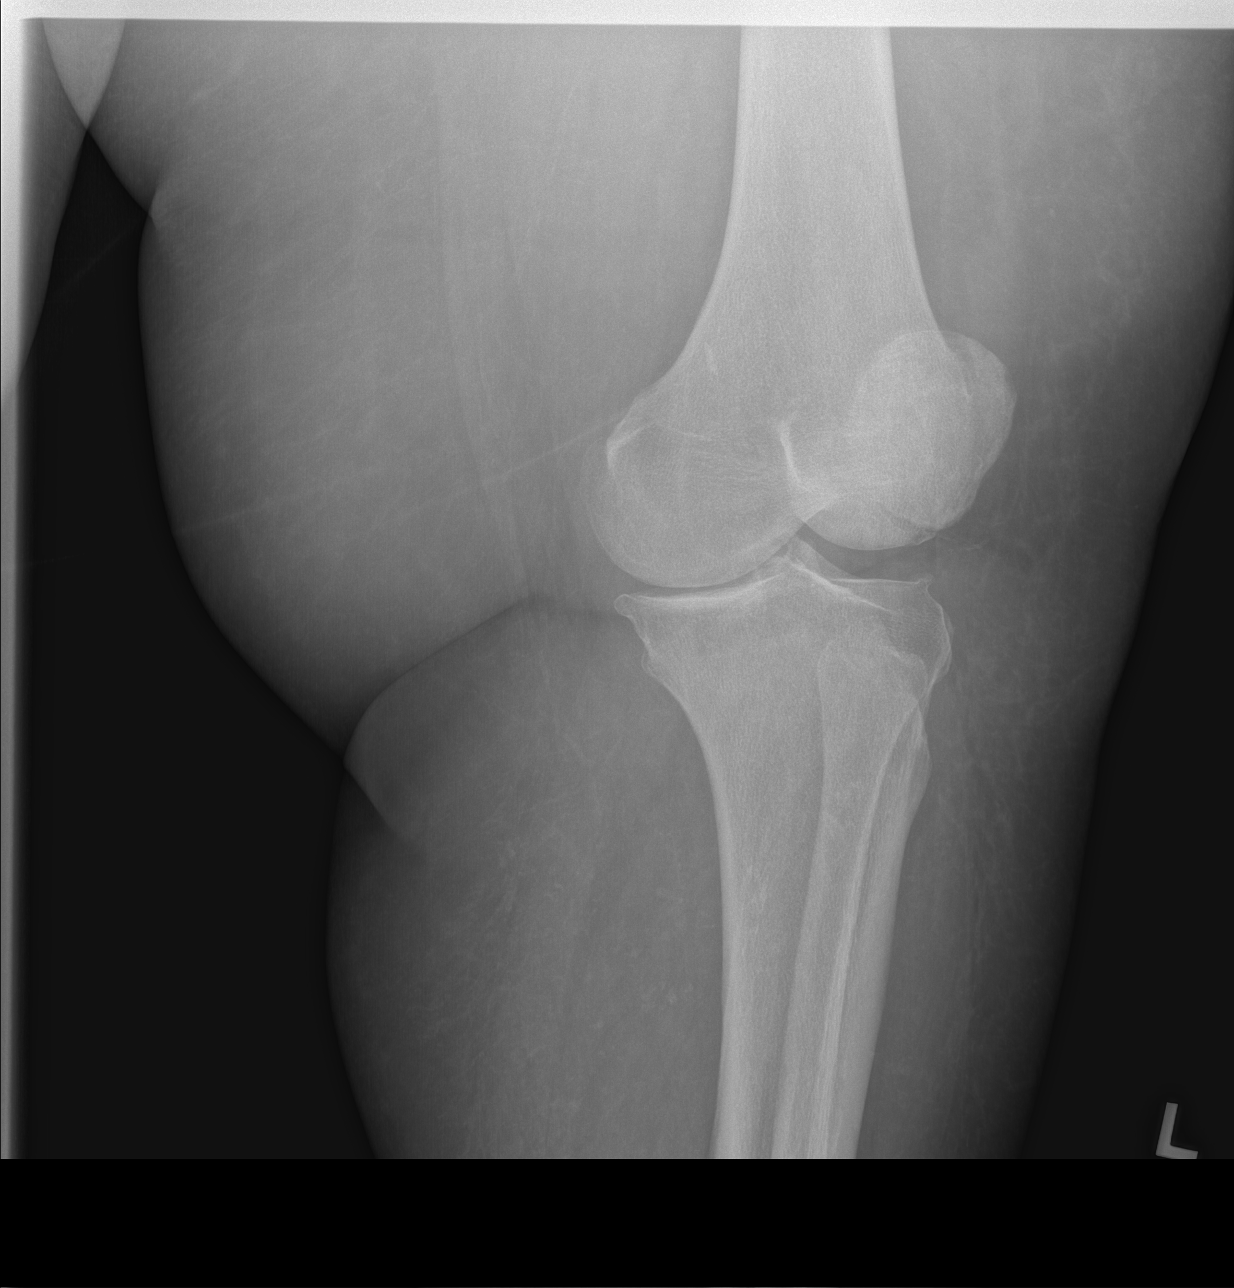

[x knee ap left]
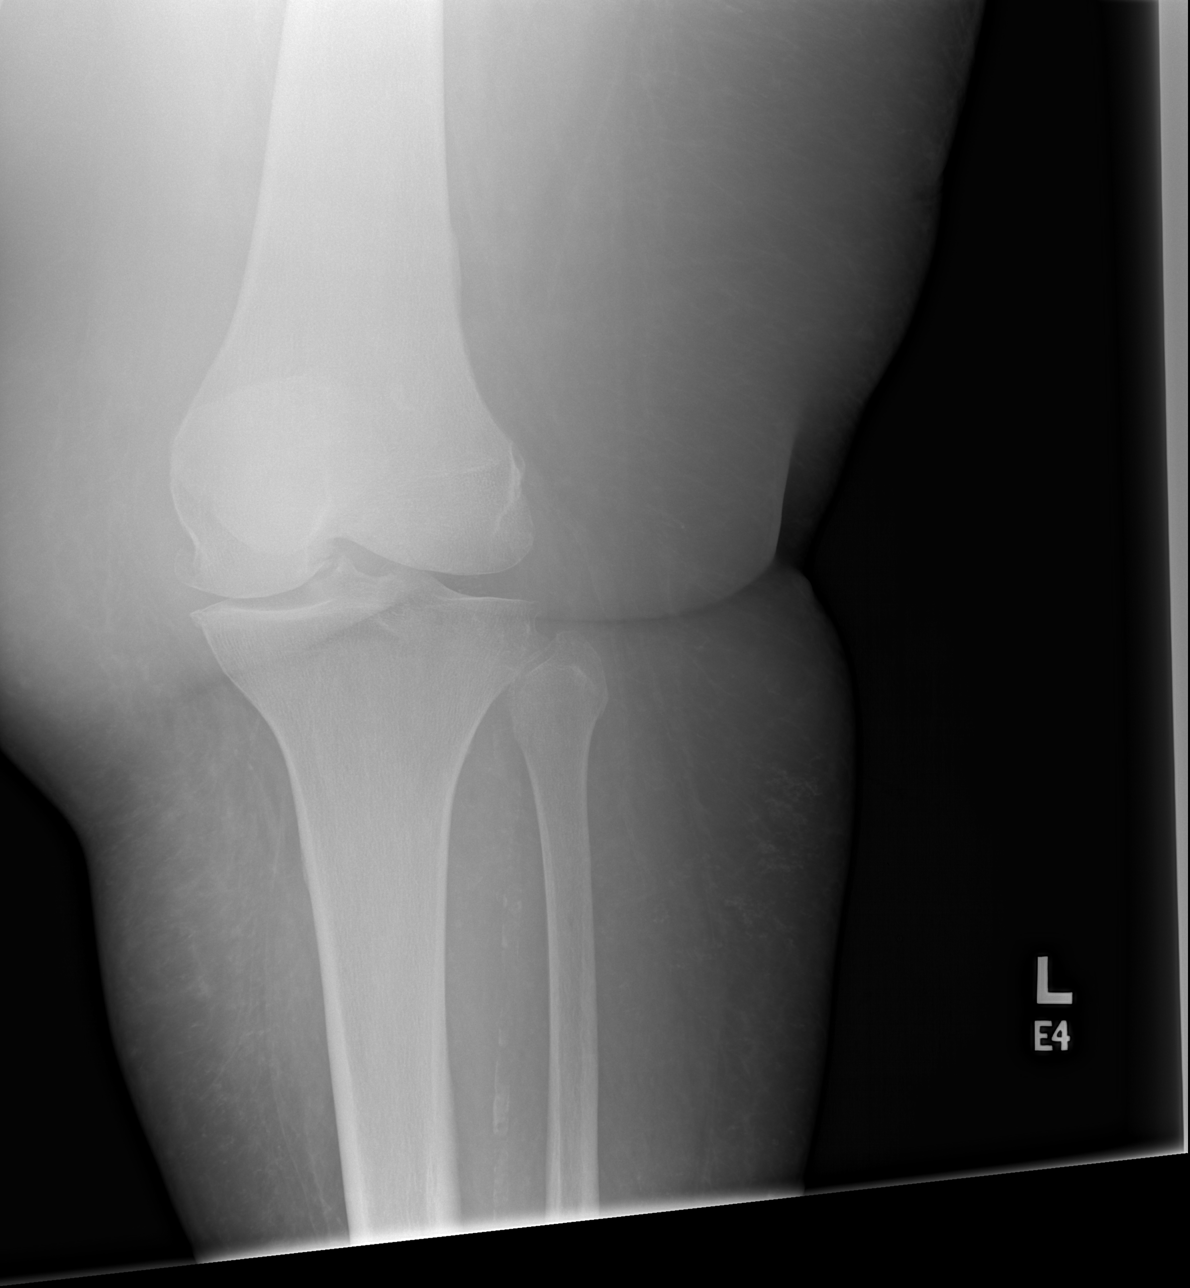

[x knee lat left]
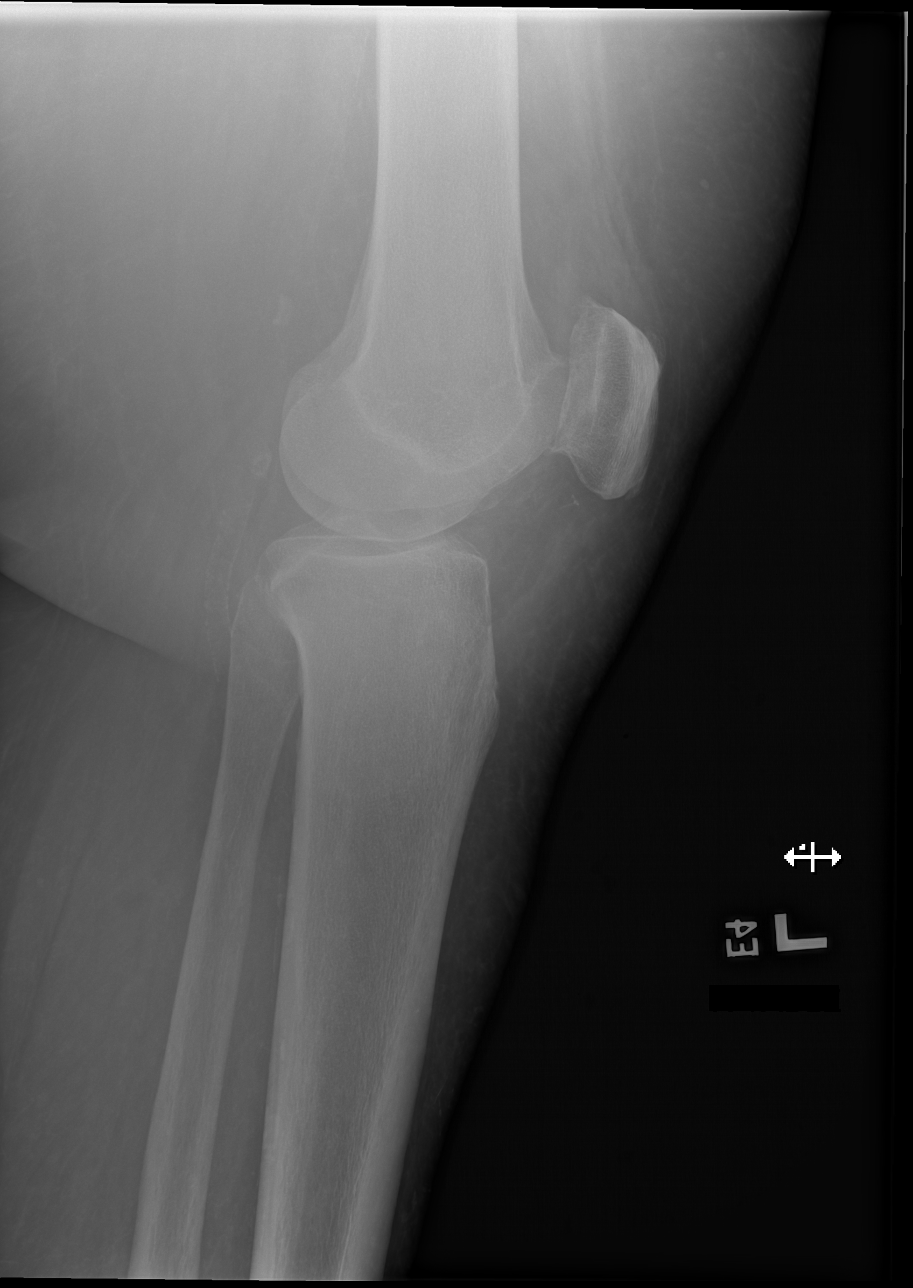

[4 of 4 positions shown; findings below may reference images not displayed]

FINDINGS: Small knee joint effusion. Medial compartment osteoarthritis with
joint space narrowing and marginal osteophytes. Some degenerative
change also at the patellofemoral joint. Regional arterial
calcification is noted.
IMPRESSION: Medial compartment and patellofemoral compartment osteoarthritis.
Small joint effusion.

## 2023-08-05 ENCOUNTER — Telehealth: Payer: Self-pay | Admitting: Orthopedic Surgery

## 2023-08-05 NOTE — Telephone Encounter (Signed)
This pt has not been in the office since June can you call and make an appt for the next available with Erin please? Thanks!

## 2023-08-05 NOTE — Telephone Encounter (Signed)
Rx refill Pregabalin (Lyrica)  [facility] [id number], [place]

## 2023-08-08 IMAGING — CT CT ABD-PELV W/O CM
2 of 4 series · 17 of 46 positions shown, 19 images · non-contrast
Comparison: 01/01/2021

CLINICAL DATA: Short of breath, lethargy, abdominal distension

EXAM:
CT ABDOMEN AND PELVIS WITHOUT CONTRAST
TECHNIQUE: Multidetector CT imaging of the abdomen and pelvis was performed
following the standard protocol without IV contrast.

[Series 3: a/p w/o 5mm · axial · non-contrast · 0.91mm/px · z∈[-920,-466]mm · 14 of 101 slices shown, 16 images]
[im 5/101  soft-tissue]
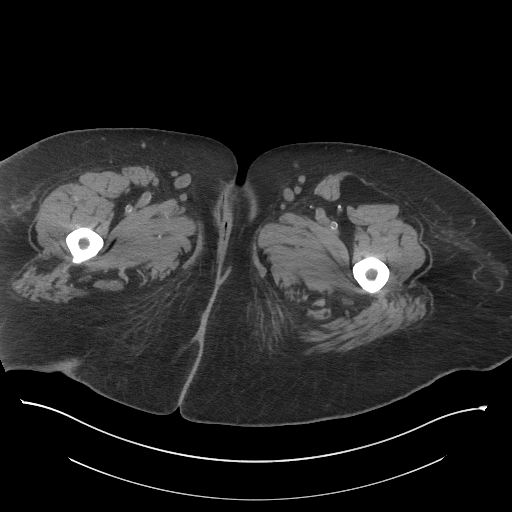
[im 5/101  bone]
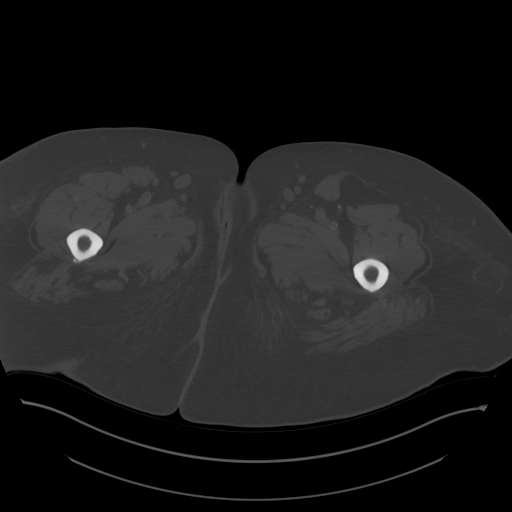
[im 13/101  soft-tissue]
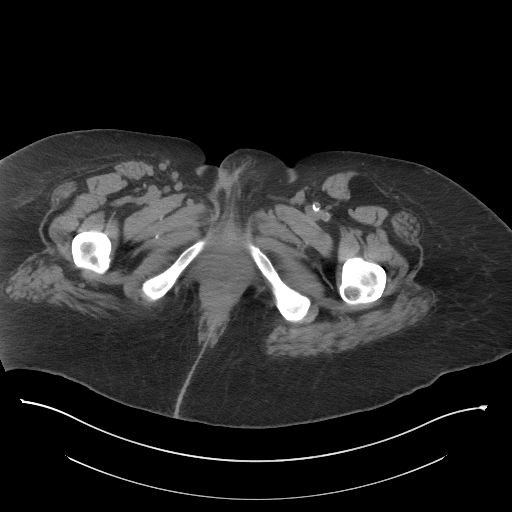
[im 21/101  soft-tissue]
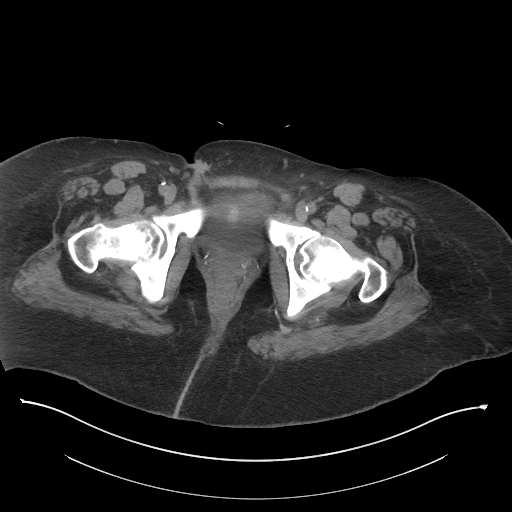
[im 26/101  soft-tissue]
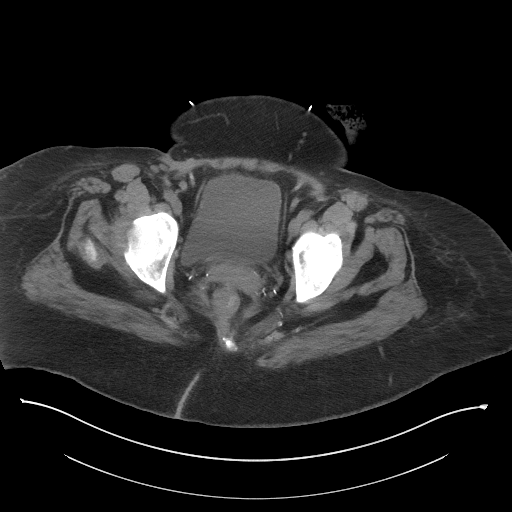
[im 34/101  soft-tissue]
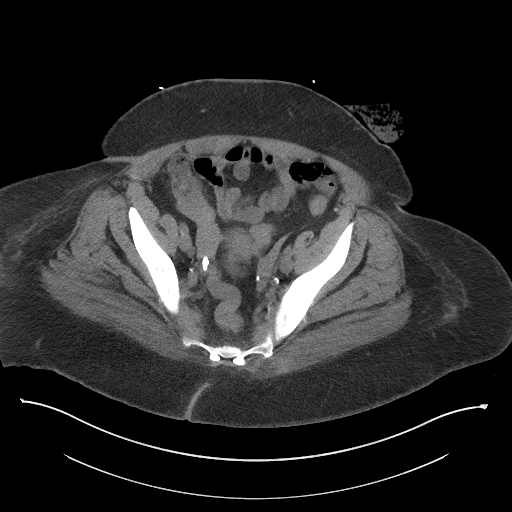
[im 42/101  soft-tissue]
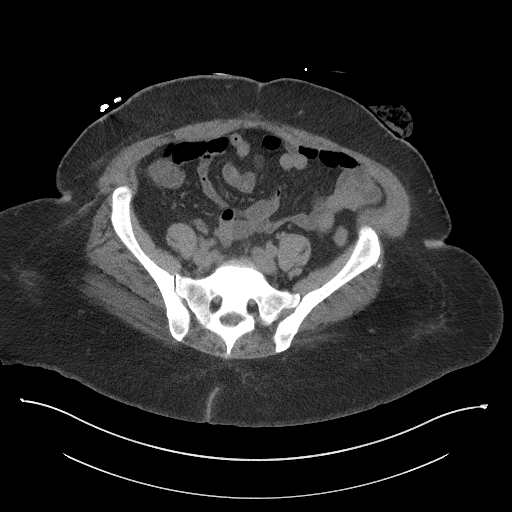
[im 46/101  soft-tissue]
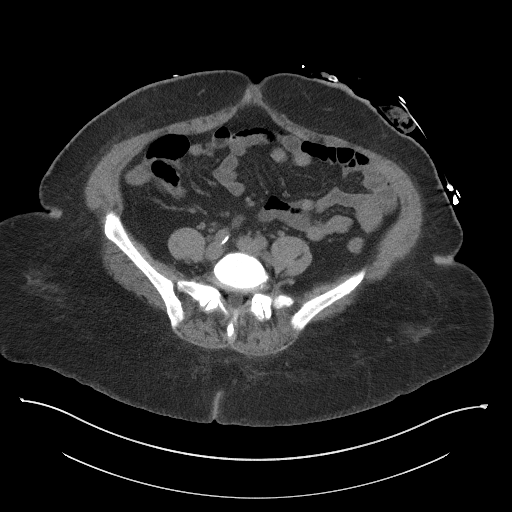
[im 55/101  soft-tissue]
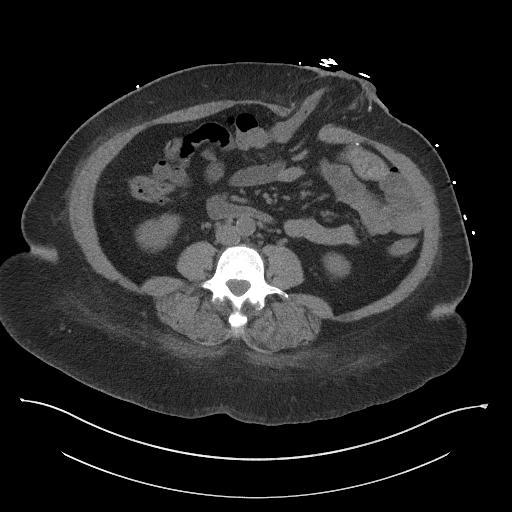
[im 59/101  soft-tissue]
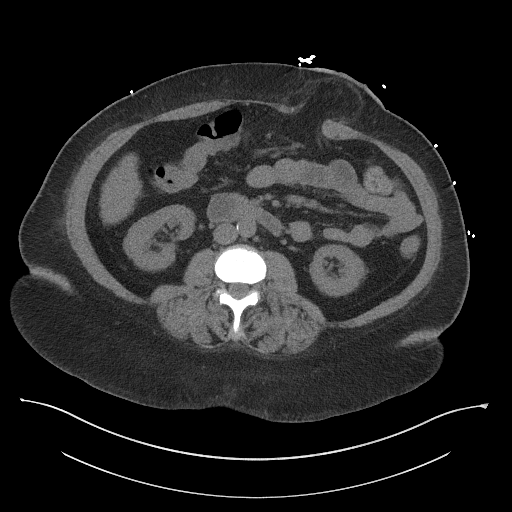
[im 59/101  bone]
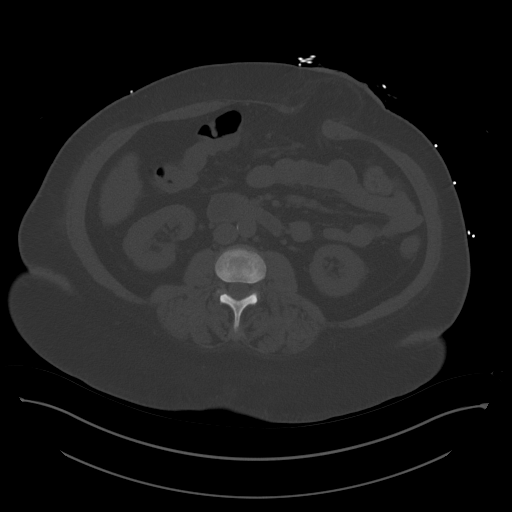
[im 67/101  soft-tissue]
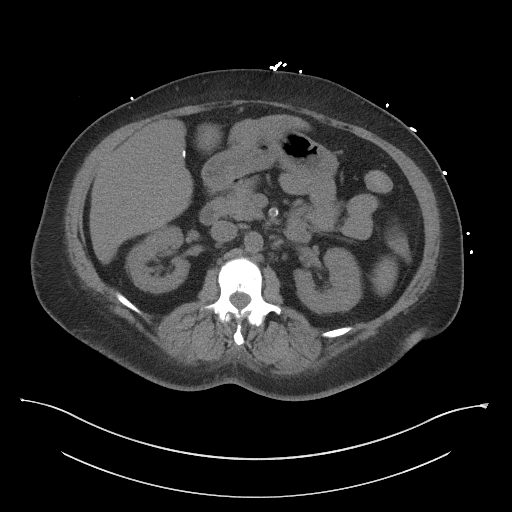
[im 76/101  soft-tissue]
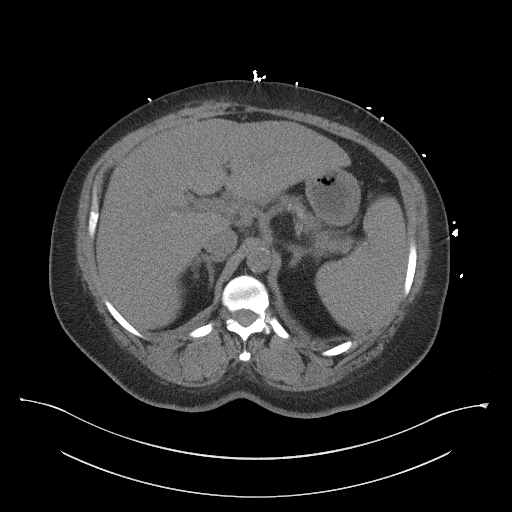
[im 80/101  soft-tissue]
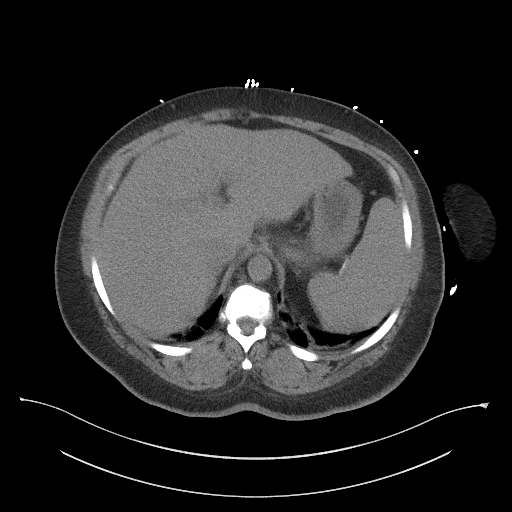
[im 88/101  soft-tissue]
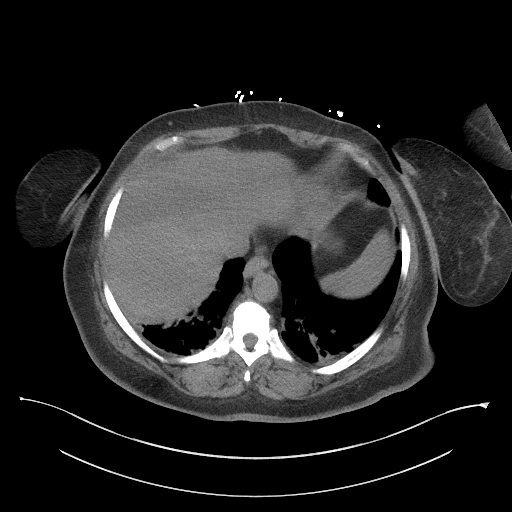
[im 96/101  soft-tissue]
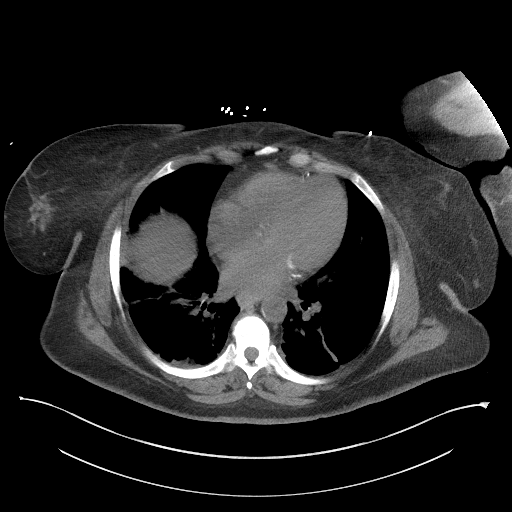

[Series 6: a/p w/o cor · coronal · non-contrast · 0.86mm/px · 3 of 171 slices shown]
[im 57/171  soft-tissue]
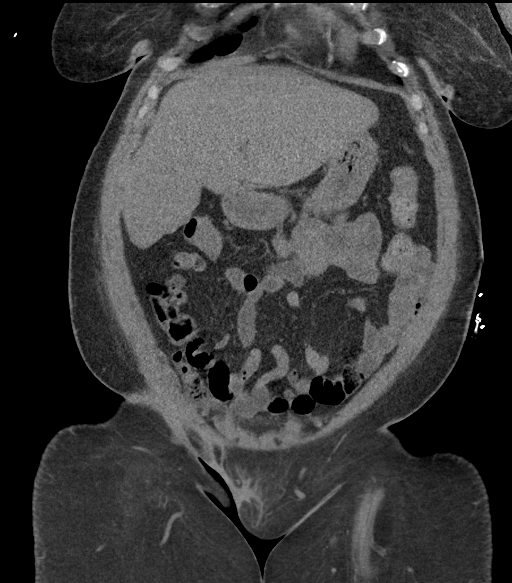
[im 76/171  soft-tissue]
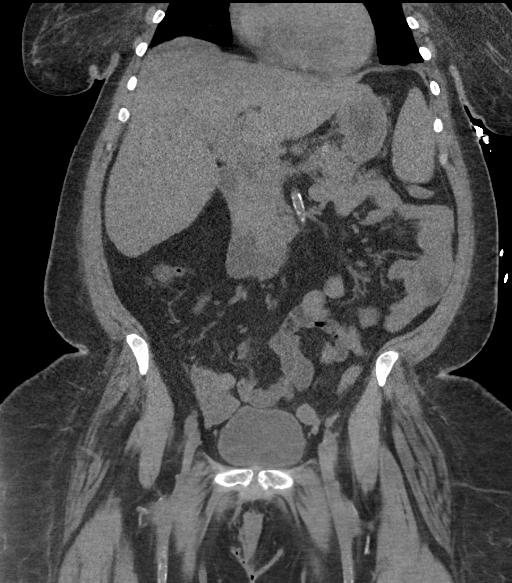
[im 95/171  soft-tissue]
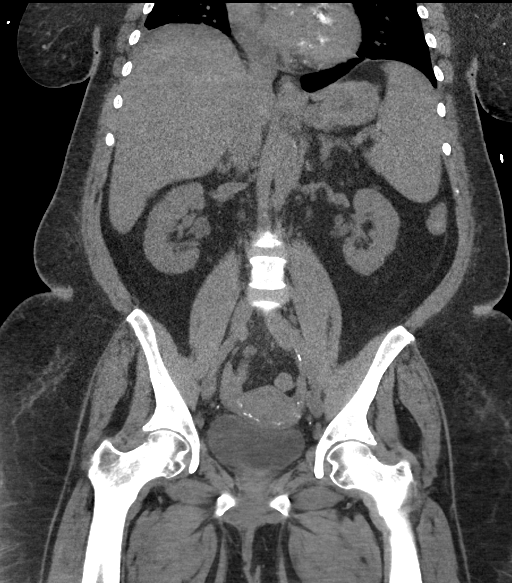

[17 of 46 positions shown; findings below may reference images not displayed]

FINDINGS: Lower chest: Hypoventilatory changes at the lung bases. No acute
airspace disease or effusion.

Hepatobiliary: Cholecystectomy. Unenhanced imaging of the liver is
unremarkable.

Pancreas: Unremarkable. No pancreatic ductal dilatation or
surrounding inflammatory changes.

Spleen: Normal in size without focal abnormality.

Adrenals/Urinary Tract: Stable left renal cortical cyst. No urinary
tract calculi or obstructive uropathy. Bladder is unremarkable.
Adrenals are normal.

Stomach/Bowel: No bowel obstruction or ileus. Postsurgical changes
are seen from diverting colostomy left mid abdomen. No bowel wall
thickening or inflammatory change.

Vascular/Lymphatic: Aortic atherosclerosis. No enlarged abdominal or
pelvic lymph nodes.

Reproductive: Uterus and bilateral adnexa are unremarkable.

Other: No free fluid or free gas.

Musculoskeletal: No acute or destructive bony lesions. Stable
postsurgical changes right inguinal region. Reconstructed images
demonstrate no additional findings.
IMPRESSION: 1. No acute intra-abdominal or intrapelvic process.
2. Stable postsurgical changes from diverting colostomy. No bowel
obstruction or ileus.
3.  Aortic Atherosclerosis ([id number]).

## 2023-08-08 IMAGING — DX DG CHEST 1V PORT
1 series · 1 of 1 positions shown · non-contrast
Comparison: 03/05/2021

CLINICAL DATA: Short of breath, hypoxic, missed dialysis

EXAM:
PORTABLE CHEST 1 VIEW

[chest ap]
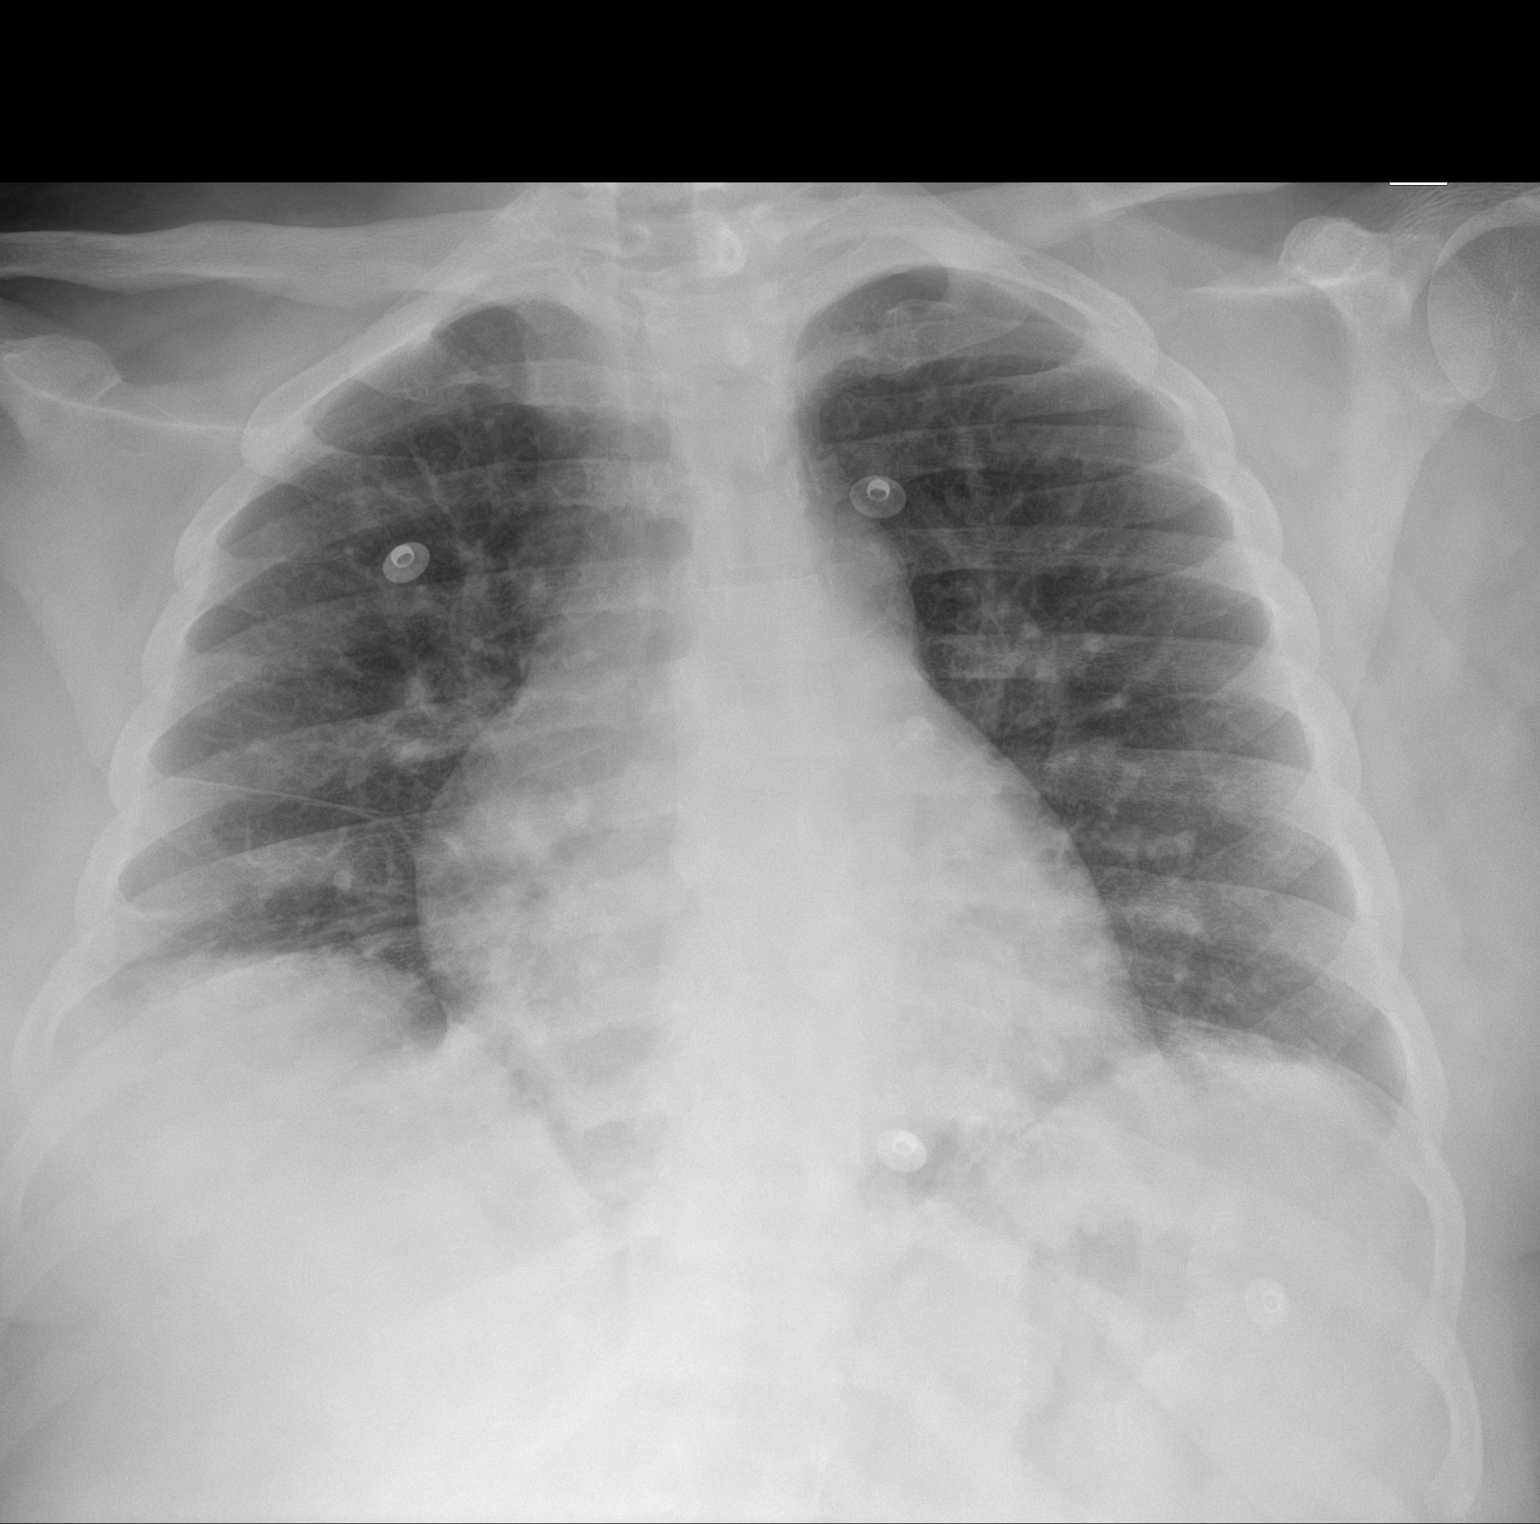

[1 of 1 positions shown; findings below may reference images not displayed]

FINDINGS: Single frontal view of the chest demonstrates stable enlargement of
the cardiac silhouette. Chronic vascular congestion without airspace
disease, effusion, or pneumothorax. No acute bony abnormalities.
IMPRESSION: 1. Chronic vascular congestion without overt edema.

## 2023-08-08 IMAGING — CT CT HEAD W/O CM
4 series · 16 of 47 positions shown, 18 images · non-contrast
Comparison: 09/26/2015

CLINICAL DATA: Mental status change, unknown cause

EXAM:
CT HEAD WITHOUT CONTRAST
TECHNIQUE: Contiguous axial images were obtained from the base of the skull
through the vertex without intravenous contrast.

[Series 2: head without · axial · non-contrast · 0.41mm/px · z∈[-220,-100]mm · 7 of 32 slices shown, 9 images]
[im 4/32  brain]
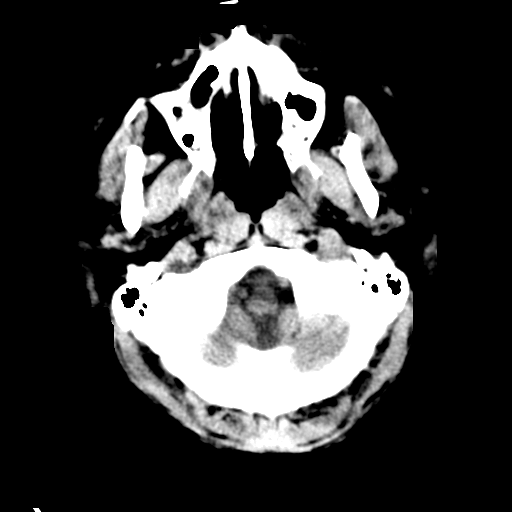
[im 4/32  bone]
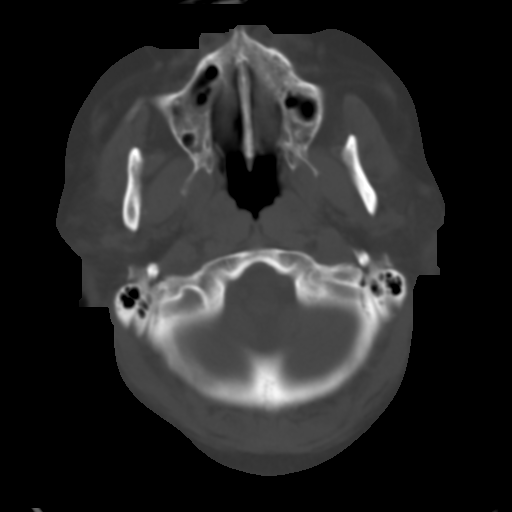
[im 8/32  brain]
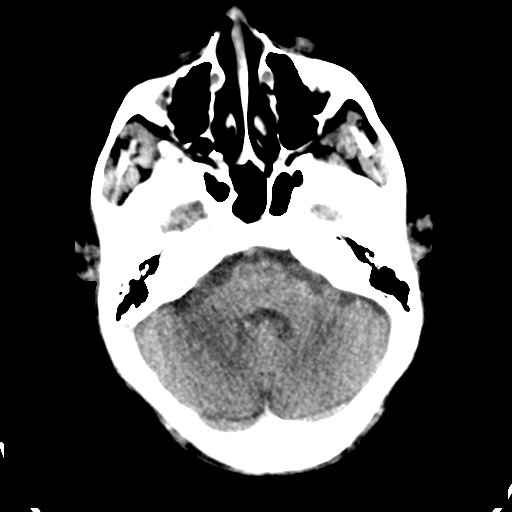
[im 12/32  brain]
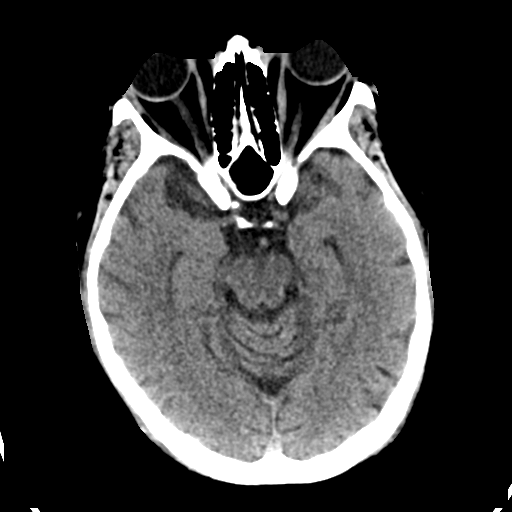
[im 16/32  brain]
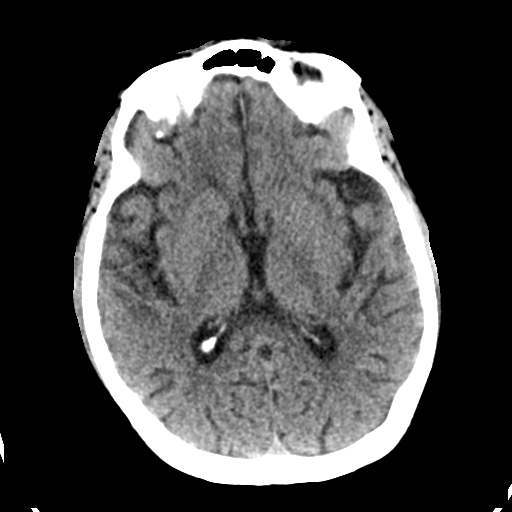
[im 20/32  brain]
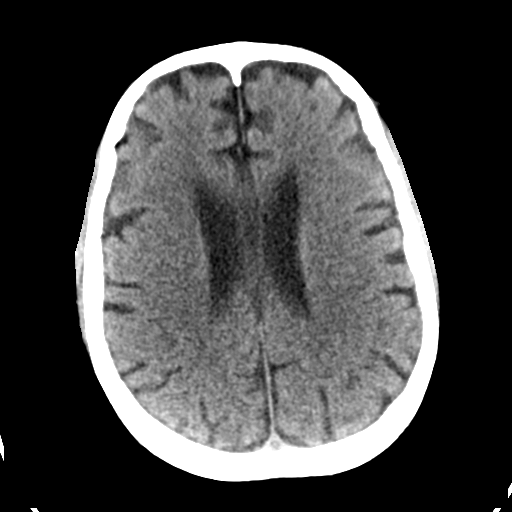
[im 20/32  bone]
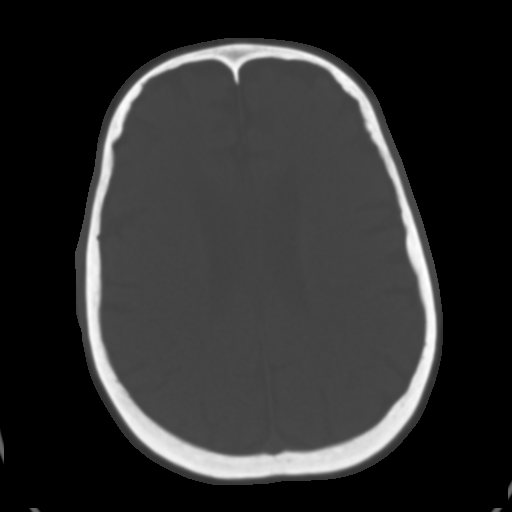
[im 24/32  brain]
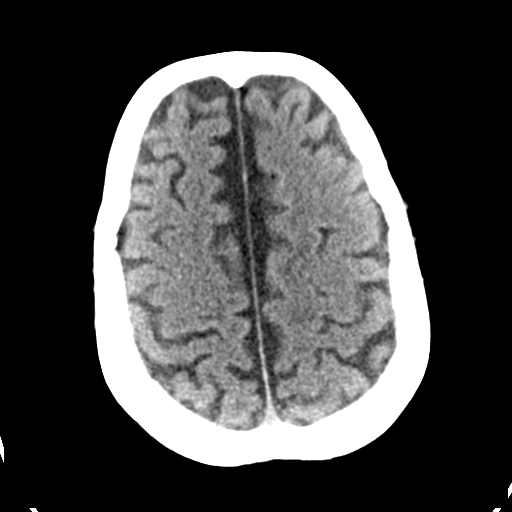
[im 28/32  brain]
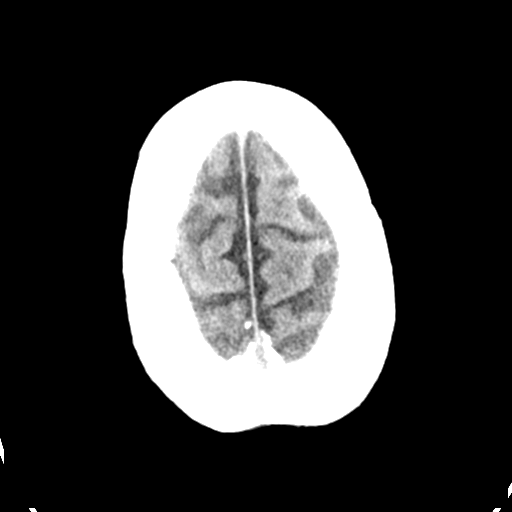

[Series 3: head bone · axial · 0.41mm/px · z∈[-221,-189]mm · 3 of 80 slices shown]
[im 8/80  bone]
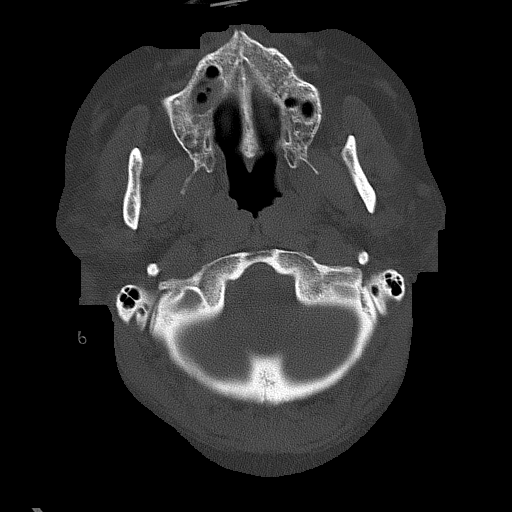
[im 16/80  bone]
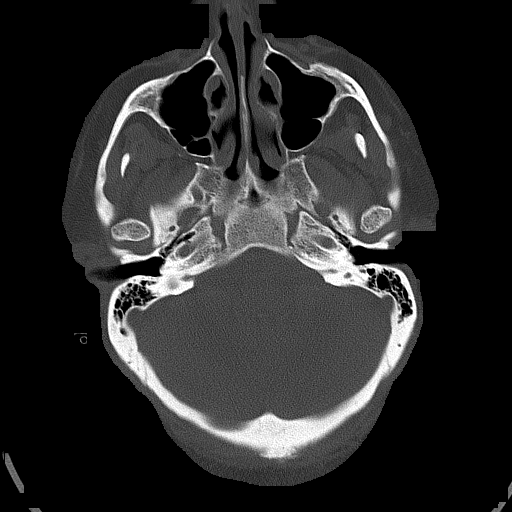
[im 24/80  bone]
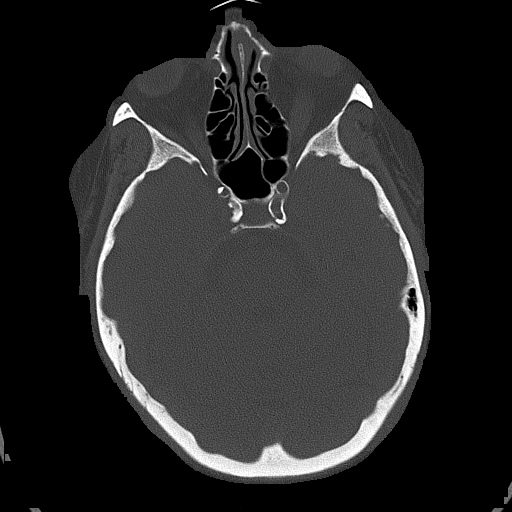

[Series 4: head without cor · coronal · non-contrast · 0.31mm/px · 3 of 67 slices shown]
[im 23/67  brain]
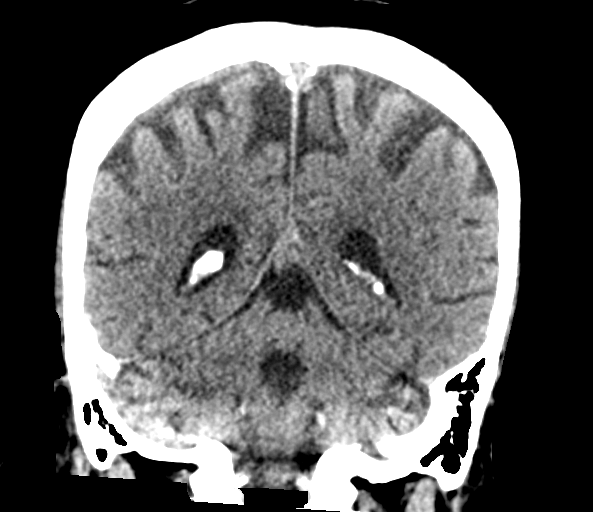
[im 30/67  brain]
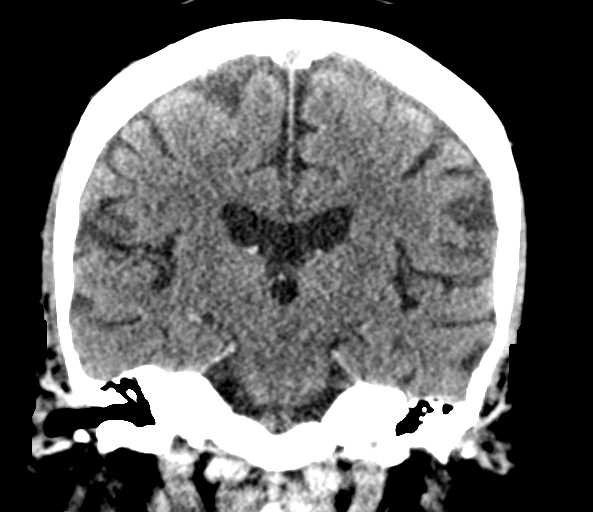
[im 37/67  brain]
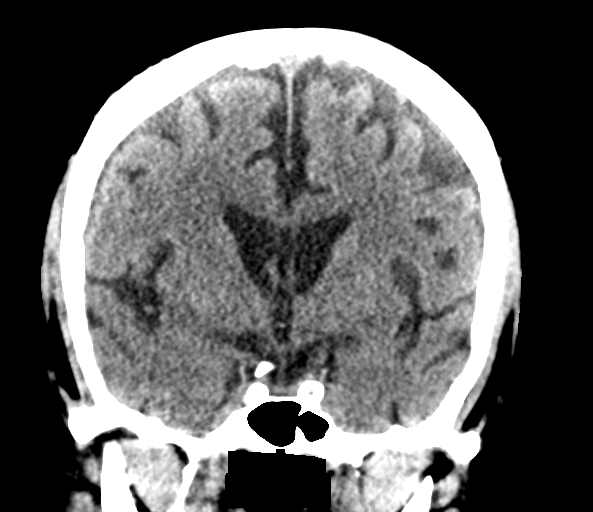

[Series 5: head without sag · sagittal · non-contrast · 0.31mm/px · 3 of 62 slices shown]
[im 21/62  brain]
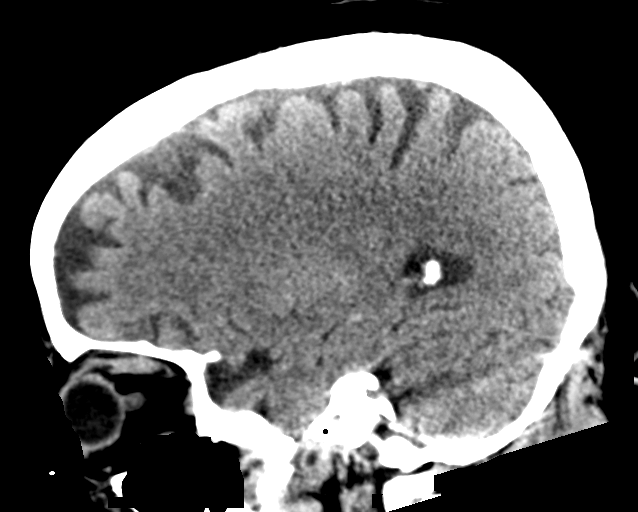
[im 31/62  brain]
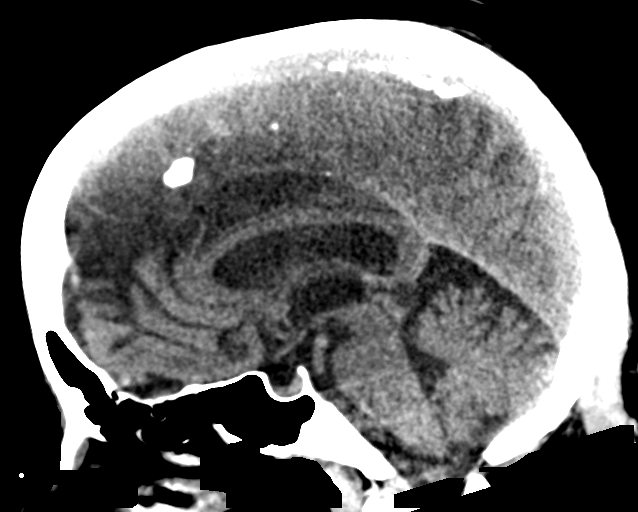
[im 41/62  brain]
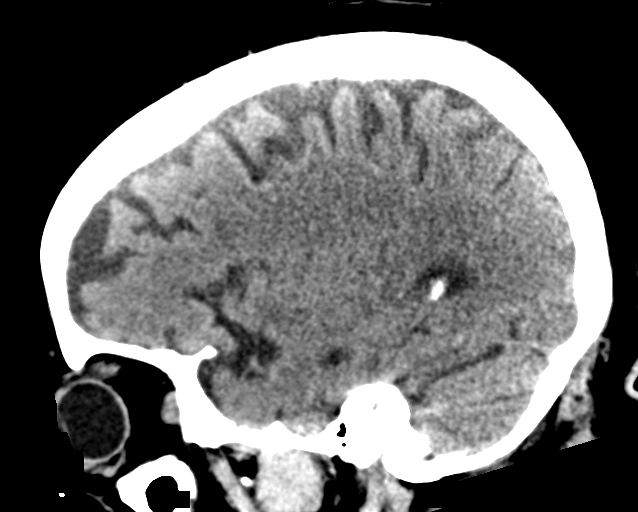

[16 of 47 positions shown; findings below may reference images not displayed]

FINDINGS: Brain: Diffuse cerebral atrophy. No acute intracranial abnormality.
Specifically, no hemorrhage, hydrocephalus, mass lesion, acute
infarction, or significant intracranial injury.

Vascular: No hyperdense vessel or unexpected calcification.

Skull: No acute calvarial abnormality.

Sinuses/Orbits: No acute findings

Other: None
IMPRESSION: Cerebral atrophy.  No acute intracranial abnormality.

## 2023-08-09 IMAGING — DX DG CHEST 1V PORT
1 series · 1 of 1 positions shown · non-contrast
Comparison: Portable chest 04/20/2021 and earlier.

CLINICAL DATA: 65-year-old female positive [id number]. Shortness of
breath. Right side chest pain.

EXAM:
PORTABLE CHEST 1 VIEW

[chest ap]
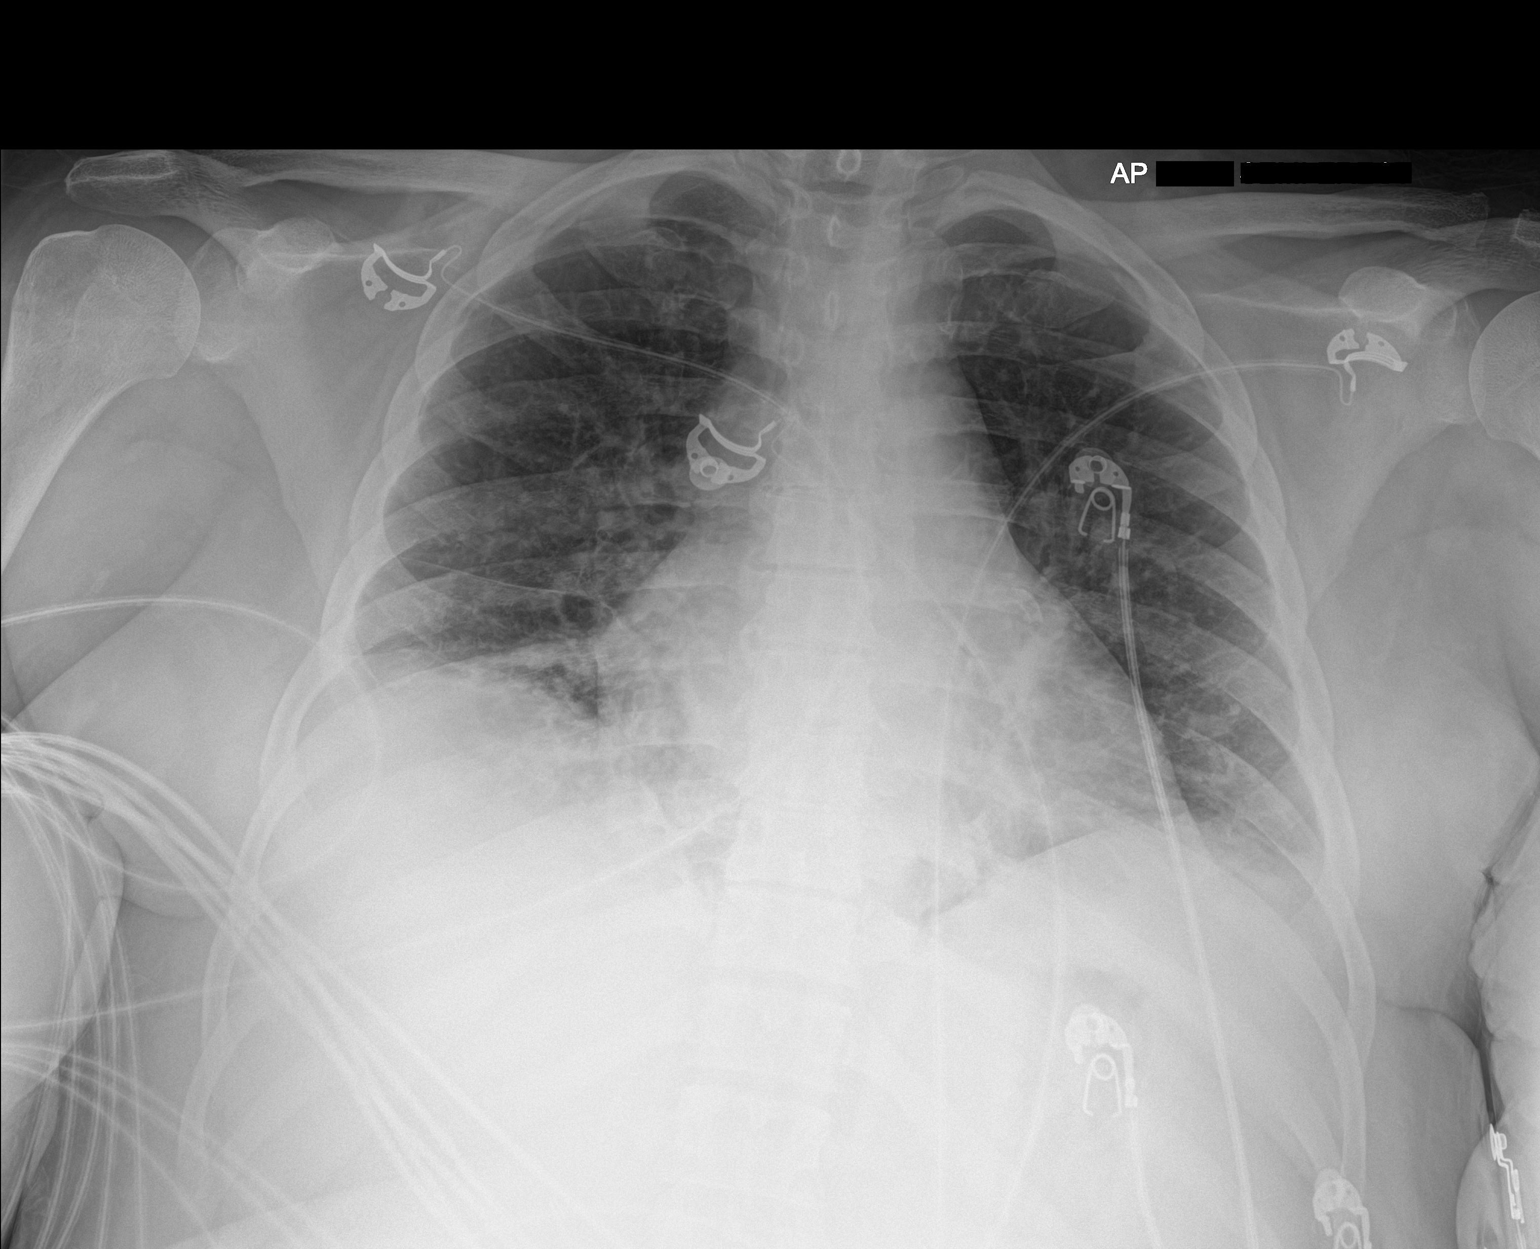

[1 of 1 positions shown; findings below may reference images not displayed]

FINDINGS: Portable AP semi upright view at [id number] hours. Lower lung volumes.
Stable cardiomegaly and mediastinal contours. Visualized tracheal
air column is within normal limits. No pneumothorax, pulmonary edema
or pleural effusion. Streaky bibasilar pulmonary opacity appears new
or increased from yesterday. No consolidation. Paucity of bowel gas.
No acute osseous abnormality identified.
IMPRESSION: Lower lung volumes since yesterday with increased bibasilar
atelectasis versus patchy [id number] pneumonia. No pleural effusion.

## 2023-08-11 ENCOUNTER — Ambulatory Visit: Payer: 59 | Admitting: Family

## 2024-01-05 IMAGING — DX DG CHEST 1V PORT
1 series · 1 of 1 positions shown · non-contrast
Comparison: 04/21/2021

CLINICAL DATA: Shortness of breath during dialysis.

EXAM:
PORTABLE CHEST 1 VIEW

[chest ap]
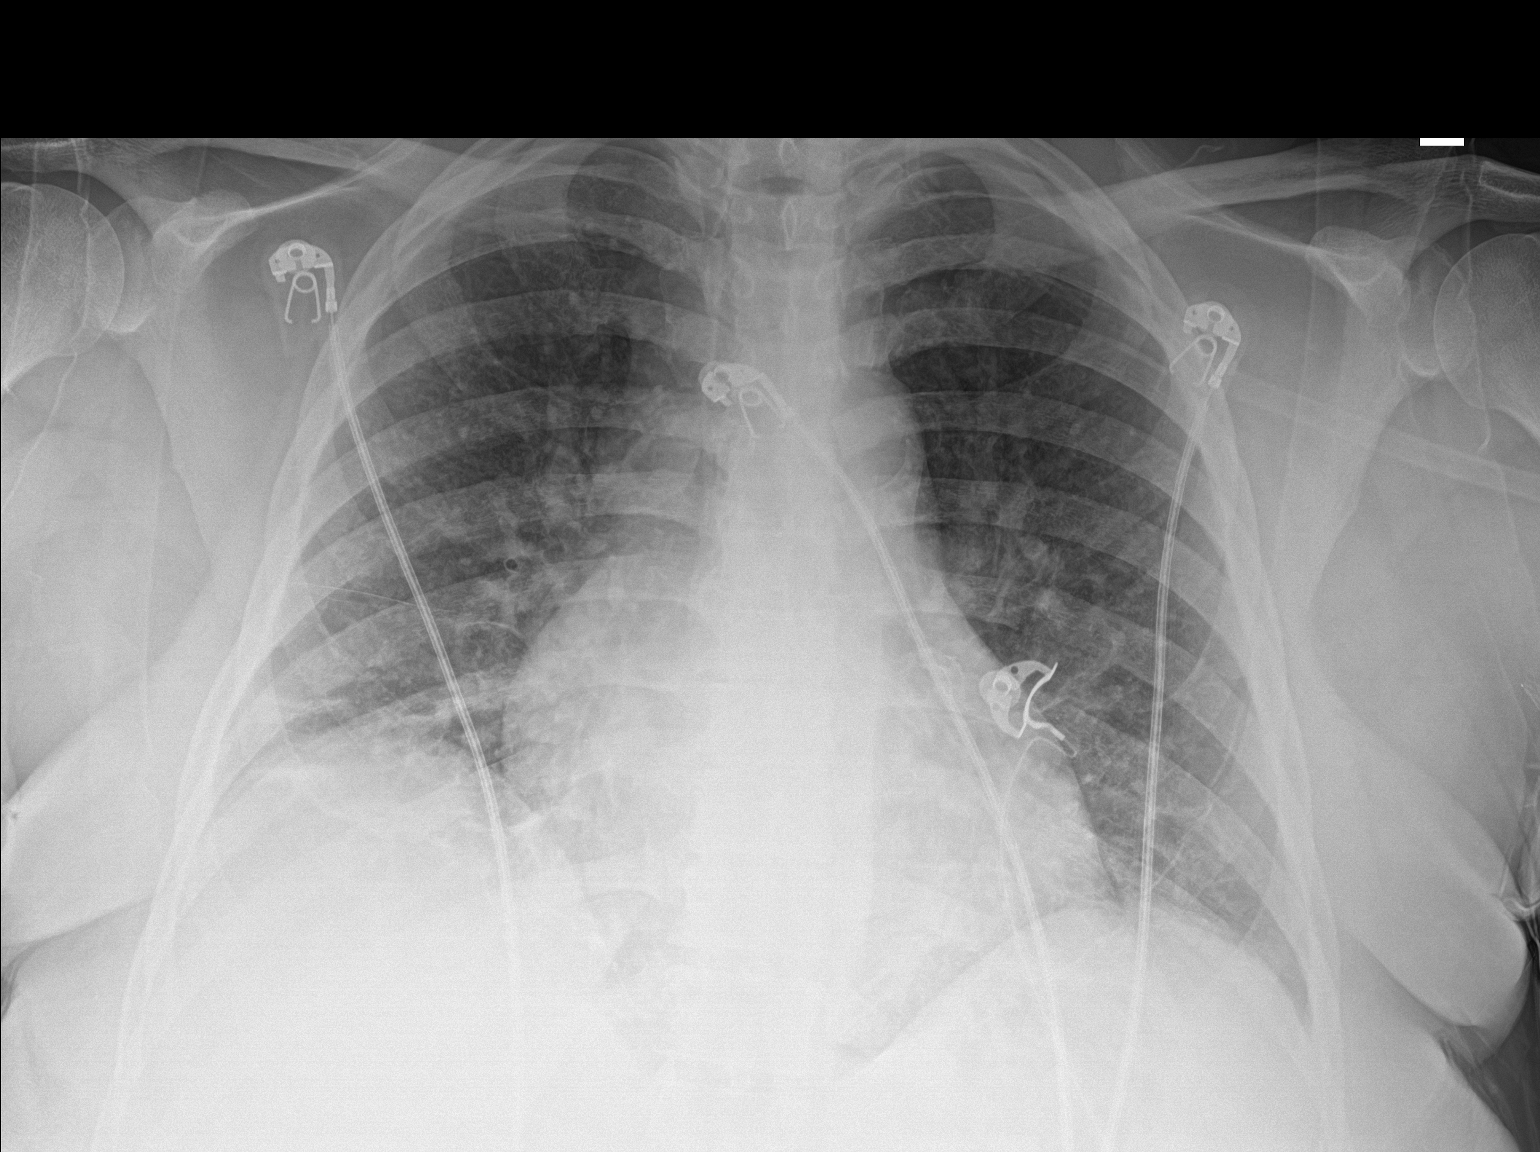

[1 of 1 positions shown; findings below may reference images not displayed]

FINDINGS: The heart is within normal limits in size given the AP projection
portable technique. Stable coronary artery stent.

Central vascular congestion and streaky basilar scarring and
atelectasis but no overt pulmonary edema or pleural effusions.
IMPRESSION: Central vascular congestion and streaky basilar scarring and
atelectasis. No pulmonary edema or pleural effusions.

## 2024-01-21 ENCOUNTER — Ambulatory Visit (HOSPITAL_COMMUNITY)
Admission: RE | Admit: 2024-01-21 | Discharge: 2024-01-21 | Disposition: A | Discharge status: Home / Self Care | Attending: Vascular Surgery | Admitting: Vascular Surgery

## 2024-01-21 ENCOUNTER — Other Ambulatory Visit: Payer: Self-pay

## 2024-01-21 ENCOUNTER — Encounter (HOSPITAL_COMMUNITY)
Admission: RE | Disposition: A | Discharge status: Home / Self Care | Payer: Self-pay | Source: Home / Self Care | Attending: Vascular Surgery

## 2024-01-21 DIAGNOSIS — I5042 Chronic combined systolic (congestive) and diastolic (congestive) heart failure: Secondary | ICD-10-CM | POA: Diagnosis not present

## 2024-01-21 DIAGNOSIS — E1122 Type 2 diabetes mellitus with diabetic chronic kidney disease: Secondary | ICD-10-CM | POA: Diagnosis not present

## 2024-01-21 DIAGNOSIS — T82898A Other specified complication of vascular prosthetic devices, implants and grafts, initial encounter: Secondary | ICD-10-CM

## 2024-01-21 DIAGNOSIS — Z87891 Personal history of nicotine dependence: Secondary | ICD-10-CM | POA: Insufficient documentation

## 2024-01-21 DIAGNOSIS — T82868A Thrombosis of vascular prosthetic devices, implants and grafts, initial encounter: Secondary | ICD-10-CM

## 2024-01-21 DIAGNOSIS — I132 Hypertensive heart and chronic kidney disease with heart failure and with stage 5 chronic kidney disease, or end stage renal disease: Secondary | ICD-10-CM | POA: Insufficient documentation

## 2024-01-21 DIAGNOSIS — Y832 Surgical operation with anastomosis, bypass or graft as the cause of abnormal reaction of the patient, or of later complication, without mention of misadventure at the time of the procedure: Secondary | ICD-10-CM | POA: Diagnosis not present

## 2024-01-21 DIAGNOSIS — Z794 Long term (current) use of insulin: Secondary | ICD-10-CM | POA: Diagnosis not present

## 2024-01-21 DIAGNOSIS — Z992 Dependence on renal dialysis: Secondary | ICD-10-CM

## 2024-01-21 DIAGNOSIS — Z79899 Other long term (current) drug therapy: Secondary | ICD-10-CM | POA: Diagnosis not present

## 2024-01-21 DIAGNOSIS — N186 End stage renal disease: Secondary | ICD-10-CM

## 2024-01-21 DIAGNOSIS — T82858A Stenosis of vascular prosthetic devices, implants and grafts, initial encounter: Secondary | ICD-10-CM | POA: Diagnosis not present

## 2024-01-21 HISTORY — PX: PERIPHERAL VASCULAR THROMBECTOMY: CATH118306

## 2024-01-21 HISTORY — PX: DIALYSIS/PERMA CATHETER INSERTION: CATH118288

## 2024-01-21 HISTORY — PX: VENOUS STENT: CATH118377

## 2024-01-21 LAB — GLUCOSE, CAPILLARY: Glucose-Capillary: 164 mg/dL — ABNORMAL HIGH (ref 70–99)

## 2024-01-21 SURGERY — PERIPHERAL VASCULAR THROMBECTOMY

## 2024-01-21 MED ORDER — FENTANYL CITRATE (PF) 100 MCG/2ML IJ SOLN
INTRAMUSCULAR | Status: AC
  Filled 2024-01-21: qty 2

## 2024-01-21 MED ORDER — IODIXANOL 320 MG/ML IV SOLN
INTRAVENOUS | Status: DC | PRN
  Administered 2024-01-21: 65 mL via INTRAVENOUS

## 2024-01-21 MED ORDER — HEPARIN SODIUM (PORCINE) 10000 UNIT/ML IJ SOLN
INTRAMUSCULAR | Status: DC | PRN
  Administered 2024-01-21: 3200 [IU]
  Administered 2024-01-21: 2000 [IU]
  Administered 2024-01-21: 8000 [IU]

## 2024-01-21 MED ORDER — MIDAZOLAM HCL 2 MG/2ML IJ SOLN
INTRAMUSCULAR | Status: AC
Start: 2024-01-21 — End: 2024-01-21
  Filled 2024-01-21: qty 2

## 2024-01-21 MED ORDER — MIDAZOLAM HCL 2 MG/2ML IJ SOLN
INTRAMUSCULAR | Status: DC | PRN
  Administered 2024-01-21: 1 mg via INTRAVENOUS

## 2024-01-21 MED ORDER — HEPARIN SODIUM (PORCINE) 1000 UNIT/ML IJ SOLN
INTRAMUSCULAR | Status: AC
Start: 2024-01-21 — End: 2024-01-21
  Filled 2024-01-21: qty 10

## 2024-01-21 MED ORDER — HEPARIN SODIUM (PORCINE) 1000 UNIT/ML IJ SOLN
INTRAMUSCULAR | Status: AC
  Filled 2024-01-21: qty 10

## 2024-01-21 MED ORDER — HEPARIN (PORCINE) IN NACL 1000-0.9 UT/500ML-% IV SOLN
INTRAVENOUS | Status: DC | PRN
  Administered 2024-01-21: 1000 mL

## 2024-01-21 MED ORDER — LIDOCAINE HCL (PF) 1 % IJ SOLN
INTRAMUSCULAR | Status: DC | PRN
  Administered 2024-01-21: 10 mL via INTRADERMAL
  Administered 2024-01-21: 5 mL via INTRADERMAL
  Administered 2024-01-21: 13 mL via INTRADERMAL

## 2024-01-21 MED ORDER — LIDOCAINE HCL (PF) 1 % IJ SOLN
INTRAMUSCULAR | Status: AC
  Filled 2024-01-21: qty 30

## 2024-01-21 MED ORDER — FENTANYL CITRATE (PF) 100 MCG/2ML IJ SOLN
INTRAMUSCULAR | Status: DC | PRN
  Administered 2024-01-21: 50 ug via INTRAVENOUS

## 2024-01-21 SURGICAL SUPPLY — 17 items
BALLOON FOGARTY 5FR 40 (CATHETERS) IMPLANT
BALLOON MUSTANG 8.0X40 75 (BALLOONS) IMPLANT
BALLOON MUSTANG 8X60X75 (BALLOONS) IMPLANT
CATH ANGIO 5F BER2 65CM (CATHETERS) IMPLANT
CATH PALINDROME-P 19CM W/VT (CATHETERS) IMPLANT
CATH STR 7FR 55 BRITE (CATHETERS) IMPLANT
GLIDEWIRE ADV .035X260CM (WIRE) IMPLANT
KIT ENCORE 26 ADVANTAGE (KITS) IMPLANT
KIT MICROPUNCTURE NIT STIFF (SHEATH) IMPLANT
SET MICROPUNCTURE 5F STIFF (MISCELLANEOUS) IMPLANT
SHEATH PINNACLE 8F 10CM (SHEATH) IMPLANT
SHEATH PINNACLE R/O II 6F 4CM (SHEATH) IMPLANT
SHEATH PINNACLE R/O II 7F 4CM (SHEATH) IMPLANT
STENT VIABAHN 120X8X7.5 (Permanent Stent) IMPLANT
TUBING CIL FLEX 10 FLL-RA (TUBING) IMPLANT
WIRE BENTSON .035X145CM (WIRE) IMPLANT
WIRE TORQFLEX AUST .018X40CM (WIRE) IMPLANT

## 2024-01-21 NOTE — Op Note (Signed)
 Patient name: [name] MRN: [id number] DOB: 1955/08/10 Sex: female  01/21/2024 Pre-operative Diagnosis: Occluded left brachiobasilic fistula  Post-operative diagnosis:  Same Surgeon:  Fonda FORBES Rim, MD Procedure Performed: 1.  Ultrasound-guided micropuncture access of the left brachiobasilic fistula 2.  Left brachiobasilic fistulogram 3.  Central venogram 4.  Percutaneous mechanical thrombectomy left brachiobasilic fistula 5.  Percutaneous venoplasty, 8 x 7.5 cm Viabahn placement to the basilic vein 6.  Ultrasound-guided micropuncture access of the right internal jugular vein 7.  19 cm tunneled dialysis catheter - palindrome   Indications:    Patient is a 68 year old female with end-stage renal disease currently being dialyzed through a left arm brachiobasilic fistula.  At dialysis yesterday, they were pulling clots.  She stated the fistula was occluded.  After discussing the risks and benefits of fistulogram with possible intervention, possible tunneled dialysis catheter placement, she elected to proceed.   Findings:   Occluded left arm brachiobasilic fistula with 4 cm proximal portion patent Occlusion continued to the axillary vein involving previously placed Viabahn stent..  Widely patent central venous system Widely patent arterial anastomosis   Procedure:  The patient was identified in the holding area and taken to room 8.  The patient was then placed supine on the table and prepped and draped in the usual sterile fashion.  A time out was called.  Ultrasound was used to evaluate the left brachiobasilic fistula.  This was accessed and antegrade fashion.  Fistulogram followed demonstrating occlusion of the fistula with a catheter then placed in the axillary vein demonstrating widely patent central venous system.  Next, a wire was run into the superior vena cava.  The patient was heparinized, and an 8 x 40 mm balloon was used to macerate clot from the proximal portion of the  brachiobasilic fistula through the previously placed stent abutting the axillary vein.  Once macerated, I accessed the fistula in retrograde fashion and a wire run into the radial artery.  In similar fashion, I balloon macerated a large aneurysm, and then used a 6 [place] Fogarty embolectomy catheter from the brachial artery through the fistula inflow.  I then used suction to thrombectomize the clot that I was able to through a 7 [place] guide catheter.  Follow-up angiography demonstrated significant improvement, however there was significant, greater than 70% stenosis immediately peripheral to the previously placed stent.  I elected to extend the stent further into this basilic vein using a 8 x 7.5 [place] Viabahn stent.  Once this was completed, I balloon macerated the aneurysm further.  I had a difficult time removing all of the thrombus, and was unable to do so, however there was excellent, bleeding from the sheets.  There was a thrill.  I had a long conversation with the patient regarding her brachiobasilic fistula and the clot that remained in the aneurysm.  My concern was that she lives 2 hours away, so if dialysis failed, she would have to come back to [place].  I think the failure rate would be high due to the thrombus I was unable to remove, therefore I elected to place a right sided tunneled dialysis catheter in the right internal jugular vein.  Using ultrasound guidance the right internal jugular vein was accessed with micropuncture technique.  The access site and tunnel tract were anesthetized using lidocaine .  Through the micropuncture sheath a floppy J-wire was advanced into the superior vena cava.  A small incision was made around the skin access point.  A counterincision was  made in the chest under the clavicle.  A 19 cm tunneled dialysis catheter was then tunneled under the skin, over the clavicle into the incision in the neck.  The access point was serially dilated under direct fluoroscopic  guidance.  A peel-away sheath was introduced into the superior vena cava under fluoroscopic guidance.  The tunneling device was removed and the catheter fed through the peel-away sheath into the superior vena cava.  The peel-away sheath was removed and the catheter gently pulled back.  Adequate position was confirmed with x-Pawley.  The catheter was tested and found to flush and draw back well.  Catheter was heparin  locked.  Caps were applied.  Catheter was sutured to the skin.  The neck incision was closed with 4-0 Monocryl.  No pneumothorax appreciated on imaging Recommended attempted use of left brachiobasilic fistula.  If this does not work, recommend using the catheter   Fonda FORBES Rim MD [facility] Office: [phone]

## 2024-01-21 NOTE — Consult Note (Signed)
 Hospital Consult  Reason for Consult: Clotted brachiobasilic fistula Requesting Physician: Nephrology MRN #:  [id number]  History of Present Illness: This is a 68 y.o. female with history of left-sided AV graft placement placed on 09/19/2019 in [place] by Dr. Jama.  Patient had dialysis yesterday and the fistula was noted to be occluded. No wounds on the hand  Past Medical History:  Diagnosis Date   Anemia    Aortic stenosis    Echo 8/18: mean 13, peak 28, LVOT/AV mean velocity 0.51   Arthritis    Asthma    As a child    Bronchitis    CAD (coronary artery disease)    a. 09/2016: 50% Ost 1st Mrg stenosis, 50% 2nd Mrg stenosis, 20% Mid-Cx, 95% Prox LAD, 40% mid-LAD, and 10% dist-LAD stenosis. Staged PCI with DES to Prox-LAD.    Chronic combined systolic and diastolic CHF (congestive heart failure) (HCC) 2011   echo 2/18: EF 55-60, normal wall motion, grade 2 diastolic dysfunction, trivial AI // echo 3/18: Septal and apical HK, EF 45-50, normal wall motion, trivial AI, mild LAE, PASP 38 // echo 8/18: EF 60-65, normal wall motion, grade 1 diastolic dysfunction, calcified aortic valve leaflets, mild aortic stenosis (mean 13, peak 28, LVOT/AV mean velocity 0.51), mild AI, moderate MAC, mild LAE, trivial TR    Chronic kidney disease    STAGE 4   Chronic kidney disease on chronic dialysis (HCC)    t, th, sat   Complication of anesthesia    Depression    Diabetes mellitus Dx 1989   Elevated lipids    GERD (gastroesophageal reflux disease)    Gout    Heart murmur    asymptomatic   Hepatitis C Dx 2013   Hypertension Dx 1989   Infected surgical wound    Lt arm   Myocardial infarction (HCC) 07/2015   Obesity    Pancreatitis 2013   Pneumonia    Refusal of blood transfusions as patient is [employer]hov[employer]'s Witness    pt states she is not [place] witness and does not refuse blood products   Tendinitis    Tremors of nervous system    LEFT HAND   Ulcer 2010    Past Surgical History:   Procedure Laterality Date   A/V FISTULAGRAM Left 04/11/2019   Procedure: A/V FISTULAGRAM;  Surgeon: Jama Cordella MATSU, MD;  Location: [facility];  Service: Cardiovascular;  Laterality: Left;   A/V FISTULAGRAM Left 06/02/2019   Procedure: A/V FISTULAGRAM;  Surgeon: Jama Cordella MATSU, MD;  Location: [facility];  Service: Cardiovascular;  Laterality: Left;   AMPUTATION Left 10/28/2022   Procedure: LEFT FOOT 1ST Dupre AMPUTATION;  Surgeon: Harden Jerona GAILS, MD;  Location: [facility] OR;  Service: Orthopedics;  Laterality: Left;   APPLICATION OF WOUND VAC Left 08/14/2017   Procedure: APPLICATION OF WOUND VAC Exchange;  Surgeon: Dessa Reyes ORN, MD;  Location: [facility] ORS;  Service: General;  Laterality: Left;   APPLICATION OF WOUND VAC Left 12/21/2018   Procedure: APPLICATION OF WOUND VAC;  Surgeon: Jama Cordella MATSU, MD;  Location: [facility] ORS;  Service: Vascular;  Laterality: Left;   AV FISTULA PLACEMENT Left 08/19/2018   Procedure: ARTERIOVENOUS (AV) FISTULA CREATION ( BRACHIOBASILIC );  Surgeon: Jama Cordella MATSU, MD;  Location: [facility];  Service: Vascular;  Laterality: Left;   BASCILIC VEIN TRANSPOSITION Left 11/18/2018   Procedure: BASCILIC VEIN TRANSPOSITION;  Surgeon: Jama Cordella MATSU, MD;  Location: [facility];  Service: Vascular;  Laterality: Left;  BIOPSY  09/20/2020   Procedure: BIOPSY;  Surgeon: Rollin Dover, MD;  Location: [facility] ENDOSCOPY;  Service: Endoscopy;;   CHOLECYSTECTOMY     COLONOSCOPY WITH PROPOFOL  N/A 02/03/2018   Procedure: COLONOSCOPY WITH PROPOFOL ;  Surgeon: Unk Corinn Skiff, MD;  Location: [facility] ENDOSCOPY;  Service: Gastroenterology;  Laterality: N/A;   COLONOSCOPY WITH PROPOFOL  N/A 07/25/2022   Procedure: COLONOSCOPY WITH PROPOFOL ;  Surgeon: Maryruth Ole DASEN, MD;  Location: [facility] ENDOSCOPY;  Service: Endoscopy;  Laterality: N/A;   CORONARY ANGIOPLASTY  07/2015   STENT   CORONARY STENT INTERVENTION N/A 09/18/2016   Procedure: Coronary Stent Intervention;  Surgeon:  Debby DELENA Sor, MD;  Location: [facility];  Service: Cardiovascular;  Laterality: N/A;   DIALYSIS/PERMA CATHETER INSERTION N/A 05/10/2018   Procedure: DIALYSIS/PERMA CATHETER INSERTION;  Surgeon: Jama Cordella MATSU, MD;  Location: [facility];  Service: Cardiovascular;  Laterality: N/A;   DRESSING CHANGE UNDER ANESTHESIA Left 08/15/2017   Procedure: exploration of wound for bleeding;  Surgeon: Dessa Reyes ORN, MD;  Location: [facility] ORS;  Service: General;  Laterality: Left;   ESOPHAGOGASTRODUODENOSCOPY (EGD) WITH PROPOFOL  N/A 02/03/2018   Procedure: ESOPHAGOGASTRODUODENOSCOPY (EGD) WITH PROPOFOL ;  Surgeon: Unk Corinn Skiff, MD;  Location: [facility] ENDOSCOPY;  Service: Gastroenterology;  Laterality: N/A;   ESOPHAGOGASTRODUODENOSCOPY (EGD) WITH PROPOFOL  N/A 09/20/2020   Procedure: ESOPHAGOGASTRODUODENOSCOPY (EGD) WITH PROPOFOL ;  Surgeon: Rollin Dover, MD;  Location: [facility] ENDOSCOPY;  Service: Endoscopy;  Laterality: N/A;   ESOPHAGOGASTRODUODENOSCOPY (EGD) WITH PROPOFOL  N/A 07/25/2022   Procedure: ESOPHAGOGASTRODUODENOSCOPY (EGD) WITH PROPOFOL ;  Surgeon: Maryruth Ole DASEN, MD;  Location: [facility] ENDOSCOPY;  Service: Endoscopy;  Laterality: N/A;   EYE SURGERY  11/17/2018   INCISION AND DRAINAGE ABSCESS Left 08/12/2017   Procedure: INCISION AND DRAINAGE ABSCESS;  Surgeon: Dessa Reyes ORN, MD;  Location: [facility] OR[facility];  Service: General;  Laterality: Left;   KNEE ARTHROSCOPY     LEFT HEART CATH N/A 09/18/2016   Procedure: Left Heart Cath;  Surgeon: Debby DELENA Sor, MD;  Location: [facility];  Service: Cardiovascular;  Laterality: N/A;   LEFT HEART CATH AND CORONARY ANGIOGRAPHY N/A 09/16/2016   Procedure: Left Heart Cath and Coronary Angiography;  Surgeon: Lonni JONETTA Cash, MD;  Location: [facility] INVASIVE CV LAB;  Service: Cardiovascular;  Laterality: N/A;   LEFT HEART CATH AND CORONARY ANGIOGRAPHY N/A 04/29/2017   Procedure: LEFT HEART CATH AND CORONARY ANGIOGRAPHY;  Surgeon: Mady Lonni, MD;  Location: [facility];  Service: Cardiovascular;  Laterality: N/A;   LOWER EXTREMITY ANGIOGRAPHY Right 03/08/2018   Procedure: LOWER EXTREMITY ANGIOGRAPHY;  Surgeon: Jama Cordella MATSU, MD;  Location: [facility];  Service: Cardiovascular;  Laterality: Right;   TUBAL LIGATION     TUBAL LIGATION     UPPER EXTREMITY ANGIOGRAPHY Right 09/19/2019   Procedure: UPPER EXTREMITY ANGIOGRAPHY;  Surgeon: Jama Cordella MATSU, MD;  Location: [facility];  Service: Cardiovascular;  Laterality: Right;   WOUND DEBRIDEMENT Left 12/21/2018   Procedure: DEBRIDEMENT WOUND;  Surgeon: Jama Cordella MATSU, MD;  Location: [facility] ORS;  Service: Vascular;  Laterality: Left;   WOUND DEBRIDEMENT Left 12/30/2018   Procedure: DEBRIDEMENT WOUND WITH VAC PLACEMENT (LEFT UPPER EXTREMITY);  Surgeon: Jama Cordella MATSU, MD;  Location: [facility] OR[facility];  Service: Vascular;  Laterality: Left;    Allergies  Allergen Reactions   Shellfish Allergy Anaphylaxis and Swelling   Diazepam  Other (See Comments)    felt like out of body experience   Morphine  Itching and Other (See Comments)  Tolerated hydromorphone  on 07/2020 *will take along with Benadryl *    Prior to Admission medications   Medication Sig Start Date End Date Taking? Authorizing Provider  albuterol  (VENTOLIN  HFA) 108 (90 Base) MCG/ACT inhaler Inhale 2 puffs into the lungs every 4 (four) hours as needed for wheezing or shortness of breath. 06/28/20   Vivienne Delon HERO, PA-C  aspirin  81 MG EC tablet Take 1 tablet (81 mg total) by mouth daily. 09/20/20   Singh, Prashant K, MD  atorvastatin  (LIPITOR ) 80 MG tablet Take 1 tablet (80 mg total) by mouth every evening. Patient taking differently: Take 80 mg by mouth daily. 04/24/21   Bacigalupo, Angela M, MD  carvedilol  (COREG ) 12.5 MG tablet Take 1 tablet (12.5 mg total) by mouth 2 (two) times daily with a meal. 09/19/21   Paige, Richerd PARAS, DO  Continuous Blood Gluc Receiver (FREESTYLE LIBRE 14  DAY READER) DEVI To check blood sugar ACHS 09/27/20   Vivienne Delon HERO, PA-C  Continuous Blood Gluc Sensor (FREESTYLE LIBRE 14 DAY SENSOR) MISC To check blood sugar ACHS; change every 14 days 09/27/20   Vivienne Delon HERO, PA-C  fluticasone  (FLONASE ) 50 MCG/ACT nasal spray Place 2 sprays into both nostrils daily as needed for allergies or rhinitis. 09/28/17   Vivienne Delon HERO, PA-C  fluticasone  furoate-vilanterol (BREO ELLIPTA ) 200-25 MCG/INH AEPB Inhale 1 puff into the lungs daily. 08/30/20   Vivienne Delon HERO, PA-C  Glucose Blood (BLOOD GLUCOSE TEST STRIPS 333) STRP Enough test stripes to test blood sugar TID x 30 days. No refills 04/16/22   Trudy Anthony HERO, MD  HUMULIN  70/30 KWIKPEN (70-30) 100 UNIT/ML KwikPen Inject 30 Units into the skin 2 (two) times daily. 05/26/22   [provider]  hydrOXYzine  (ATARAX ) 25 MG tablet Take 1 tablet (25 mg total) by mouth 2 (two) times daily. 11/17/22   Hongalgi, Anand D, MD  Insulin  Pen Needle 32G X 4 MM MISC Enough pen needles for 100 units of novolin  QAM x 30 days. No refills 04/16/22   Trudy Anthony HERO, MD  LEVEMIR  FLEXPEN 100 UNIT/ML FlexPen Inject 25 Units into the skin daily. 11/02/22   Harden Jerona GAILS, MD  lidocaine  (LIDODERM ) 5 % Place 1 patch onto the skin daily. Remove & Discard patch within 12 hours or as directed by MD 06/25/23   Nettie, April, MD  methocarbamol  (ROBAXIN ) 500 MG tablet Take 1 tablet (500 mg total) by mouth 2 (two) times daily. 11/02/22   Harden Jerona GAILS, MD  midodrine  (PROAMATINE ) 10 MG tablet Take 10 mg by mouth See admin instructions. Take 10 mg by mouth 30 minutes before dialysis on Tues/Thurs/Sat 12/23/21   [provider]  naloxone  (NARCAN ) nasal spray 4 mg/0.1 mL Place 1 spray into the nose as directed. 04/08/22   [provider]  nitroGLYCERIN  (NITRODUR - DOSED IN MG/24 HR) 0.2 mg/hr patch Place 1 patch (0.2 mg total) onto the skin daily. Apply a patch to the dorsum of the left ankle.  Change  location daily. 11/17/22   Hongalgi, Anand D, MD  nitroGLYCERIN  (NITRODUR - DOSED IN MG/24 HR) 0.2 mg/hr patch Place 1 patch (0.2 mg total) onto the skin daily. Apply 1 patch to the dorsum of the ankle and change location daily.  Wash ankle and foot with soap and water daily dry before applying patch. 12/24/22 12/24/23  Harden Jerona GAILS, MD  nitroGLYCERIN  (NITROSTAT ) 0.4 MG SL tablet Place 1 tablet (0.4 mg total) under the tongue every 5 (five) minutes x 3 doses  as needed for chest pain. 05/03/20   Vivienne Delon HERO, PA-C  Oxycodone  HCl 10 MG TABS Take 10 mg by mouth 4 (four) times daily. 04/01/22   [provider]  oxyCODONE -acetaminophen  (PERCOCET) 10-325 MG tablet Take 1 tablet by mouth every 6 (six) hours as needed for pain. 04/29/23   Laurice Maude BROCKS, MD  pantoprazole  (PROTONIX ) 40 MG tablet Take 40 mg by mouth daily before breakfast.    [provider]  pregabalin  (LYRICA ) 100 MG capsule TAKE 1 CAPSULE BY MOUTH TWICE A DAY 07/13/23   Harden Jerona GAILS, MD  torsemide  (DEMADEX ) 10 MG tablet Take 10 mg by mouth See admin instructions. Take 10 mg by mouth every other morning    [provider]  valACYclovir  (VALTREX ) 500 MG tablet TAKE (1) TABLET BY MOUTH EVERY OTHER DAY. Patient taking differently: Take 500 mg by mouth daily. 01/24/21   Bacigalupo, Angela M, MD  VELPHORO  500 MG chewable tablet Chew 1,500 mg by mouth 3 (three) times daily. 09/11/21   [provider]  gabapentin  (NEURONTIN ) 100 MG capsule Take 1 capsule (100 mg total) by mouth 3 (three) times daily. 08/19/17 02/03/19  Tobie Calix, MD    Social History   Socioeconomic History   Marital status: Divorced    Spouse name: Not on file   Number of children: 3   Years of education: Not on file   Highest education level: Bachelor's degree (e.g., BA, AB, BS)  Occupational History   Occupation: disability  Tobacco Use   Smoking status: Former    Current packs/day: 0.00    Average packs/day: 0.3 packs/day for  6.0 years (1.5 ttl pk-yrs)    Types: Cigarettes    Start date: 10/26/1974    Quit date: 10/25/1980    Years since quitting: 43.2   Smokeless tobacco: Never  Vaping Use   Vaping status: Never Used  Substance and Sexual Activity   Alcohol use: Not Currently    Comment: rare   Drug use: Not Currently    Types: Marijuana   Sexual activity: Not on file    Comment: Not asked  Other Topics Concern   Not on file  Social History Narrative   Not on file   Social Drivers of Health   Financial Resource Strain: Low Risk  (12/01/2023)   Received from [facility]   Overall Financial Resource Strain (CARDIA)    Difficulty of Paying Living Expenses: Not hard at all  Food Insecurity: No Food Insecurity (12/01/2023)   Received from [facility]   Hunger Vital Sign    Within the past 12 months, you worried that your food would run out before you got the money to buy more.: Never true    Within the past 12 months, the food you bought just didn't last and you didn't have money to get more.: Never true  Transportation Needs: No Transportation Needs (12/01/2023)   Received from [facility]   PRAPARE - Transportation    Lack of Transportation (Medical): No    Lack of Transportation (Non-Medical): No  Physical Activity: Inactive (12/01/2023)   Received from [facility]   Exercise Vital Sign    On average, how many days per week do you engage in moderate to strenuous exercise (like a brisk walk)?: 0 days    On average, how many minutes do you engage in exercise at this level?: 0 min  Stress: No Stress Concern Present (  12/01/2023)   Received from [facility]   [employer] of Occupational Health - Occupational Stress Questionnaire    Feeling of Stress : Not at all  Social Connections: Unknown (12/01/2023)   Received from [facility]   Social Connection and Isolation Panel    In a typical  week, how many times do you talk on the phone with family, friends, or neighbors?: Twice a week    How often do you get together with friends or relatives?: Twice a week    How often do you attend church or religious services?: Never    Do you belong to any clubs or organizations such as church groups, unions, fraternal or athletic groups, or school groups?: No    How often do you attend meetings of the clubs or organizations you belong to?: Never    Are you married, widowed, divorced, separated, never married, or living with a partner?: Patient declined  Intimate Partner Violence: Not At Risk (12/01/2023)   Received from [employer], Afraid, Rape, and Kick questionnaire    Within the last year, have you been afraid of your partner or ex-partner?: No    Within the last year, have you been humiliated or emotionally abused in other ways by your partner or ex-partner?: No    Within the last year, have you been kicked, hit, slapped, or otherwise physically hurt by your partner or ex-partner?: No    Within the last year, have you been raped or forced to have any kind of sexual activity by your partner or ex-partner?: No   Family History  Problem Relation Age of Onset   Colon cancer Mother    Heart attack Other    Heart attack Maternal Grandmother    Hypertension Sister    Hypertension Brother    Diabetes Paternal Grandmother    Breast cancer Neg Hx     ROS: Otherwise negative unless mentioned in HPI  Physical Examination  Vitals:   01/21/24 1314  BP: 105/85  Pulse: 71  Resp: 12  Temp: 98 F (36.7 C)  SpO2: 90%   There is no height or weight on file to calculate BMI.  General:  WDWN in NAD Gait: Not observed HENT: WNL, normocephalic Pulmonary: normal non-labored breathing, without Rales, rhonchi,  wheezing Cardiac: regular,  Abdomen:  soft, NT/ND, no masses Skin: without rashes Vascular Exam/Pulses: 2+ radial, palpable thrill  Extremities:  without ischemic changes Musculoskeletal: no muscle wasting or atrophy  Neurologic: A&O X 3;  No focal weakness or paresthesias are detected; speech is fluent/normal Psychiatric:  The pt has Normal affect. Lymph:  Unremarkable  CBC    Component Value Date/Time   WBC 6.3 06/25/2023 0106   RBC 3.66 (L) 06/25/2023 0106   HGB 9.2 (L) 06/25/2023 0106   HGB 9.7 (L) 09/27/2020 1417   HCT 30.8 (L) 06/25/2023 0106   HCT 28.9 (L) 09/27/2020 1417   PLT 158 06/25/2023 0106   PLT 209 09/27/2020 1417   MCV 84.2 06/25/2023 0106   MCV 89 09/27/2020 1417   MCH 25.1 (L) 06/25/2023 0106   MCHC 29.9 (L) 06/25/2023 0106   RDW 17.8 (H) 06/25/2023 0106   RDW 14.5 09/27/2020 1417   LYMPHSABS 1.3 06/25/2023 0106   LYMPHSABS 1.5 09/27/2020 1417   MONOABS 0.5 06/25/2023 0106   EOSABS 0.2 06/25/2023 0106   EOSABS 0.2 09/27/2020 1417   BASOSABS 0.0 06/25/2023 0106   BASOSABS 0.0 09/27/2020  1417    BMET    Component Value Date/Time   NA 138 06/25/2023 0106   NA 139 09/27/2020 1417   K 4.8 06/25/2023 0106   CL 103 06/25/2023 0106   CO2 19 (L) 06/25/2023 0106   GLUCOSE 159 (H) 06/25/2023 0106   BUN 54 (H) 06/25/2023 0106   BUN 9 09/27/2020 1417   CREATININE 9.66 (H) 06/25/2023 0106   CREATININE 1.36 (H) 09/23/2016 1451   CALCIUM  8.5 (L) 06/25/2023 0106   GFRNONAA 4 (L) 06/25/2023 0106   GFRNONAA 43 (L) 09/23/2016 1451   GFRAA 6 (L) 01/31/2020 1858   GFRAA 49 (L) 09/23/2016 1451    COAGS: Lab Results  Component Value Date   INR 1.1 04/14/2022   INR 1.1 09/17/2020   INR 0.9 04/04/2019       ASSESSMENT/PLAN: This is a 68 y.o. female with history of left arm brachiobasilic fistula.  She presents today after dialysis told her that her fistula was clotted.  After discussing the risks and benefits of left arm fistulogram, possible declot, Jalayne elected to proceed.    Fonda FORBES Rim MD MS [facility] [phone] 01/21/2024  1:35 PM

## 2024-01-24 ENCOUNTER — Encounter (HOSPITAL_COMMUNITY): Payer: Self-pay | Admitting: Vascular Surgery

## 2024-01-24 NOTE — H&P (Signed)
 Hospital Consult   Reason for Consult: Clotted brachiobasilic fistula Requesting Physician: Nephrology MRN #:  [id number]   History of Present Illness: This is a 68 y.o. female with history of left-sided AV graft placement placed on 09/19/2019 in [place] by Dr. Jama.   Patient had dialysis yesterday and the fistula was noted to be occluded. No wounds on the hand       Past Medical History:  Diagnosis Date   Anemia     Aortic stenosis      Echo 8/18: mean 13, peak 28, LVOT/AV mean velocity 0.51   Arthritis     Asthma      As a child    Bronchitis     CAD (coronary artery disease)      a. 09/2016: 50% Ost 1st Mrg stenosis, 50% 2nd Mrg stenosis, 20% Mid-Cx, 95% Prox LAD, 40% mid-LAD, and 10% dist-LAD stenosis. Staged PCI with DES to Prox-LAD.    Chronic combined systolic and diastolic CHF (congestive heart failure) (HCC) 2011    echo 2/18: EF 55-60, normal wall motion, grade 2 diastolic dysfunction, trivial AI // echo 3/18: Septal and apical HK, EF 45-50, normal wall motion, trivial AI, mild LAE, PASP 38 // echo 8/18: EF 60-65, normal wall motion, grade 1 diastolic dysfunction, calcified aortic valve leaflets, mild aortic stenosis (mean 13, peak 28, LVOT/AV mean velocity 0.51), mild AI, moderate MAC, mild LAE, trivial TR    Chronic kidney disease      STAGE 4   Chronic kidney disease on chronic dialysis (HCC)      t, th, sat   Complication of anesthesia     Depression     Diabetes mellitus Dx 1989   Elevated lipids     GERD (gastroesophageal reflux disease)     Gout     Heart murmur      asymptomatic   Hepatitis C Dx 2013   Hypertension Dx 1989   Infected surgical wound      Lt arm   Myocardial infarction (HCC) 07/2015   Obesity     Pancreatitis 2013   Pneumonia     Refusal of blood transfusions as patient is Jehovah's Witness      pt states she is not [place] witness and does not refuse blood products   Tendinitis     Tremors of nervous system      LEFT HAND   Ulcer  2010               Past Surgical History:  Procedure Laterality Date   A/V FISTULAGRAM Left 04/11/2019    Procedure: A/V FISTULAGRAM;  Surgeon: Jama Cordella MATSU, MD;  Location: [facility];  Service: Cardiovascular;  Laterality: Left;   A/V FISTULAGRAM Left 06/02/2019    Procedure: A/V FISTULAGRAM;  Surgeon: Jama Cordella MATSU, MD;  Location: [facility];  Service: Cardiovascular;  Laterality: Left;   AMPUTATION Left 10/28/2022    Procedure: LEFT FOOT 1ST [place] AMPUTATION;  Surgeon: Harden Jerona GAILS, MD;  Location: [facility];  Service: Orthopedics;  Laterality: Left;   APPLICATION OF WOUND VAC Left 08/14/2017    Procedure: APPLICATION OF WOUND VAC Exchange;  Surgeon: Dessa Reyes ORN, MD;  Location: [facility] ORS;  Service: General;  Laterality: Left;   APPLICATION OF WOUND VAC Left 12/21/2018    Procedure: APPLICATION OF WOUND VAC;  Surgeon: Jama Cordella MATSU, MD;  Location: [facility];  Service: Vascular;  Laterality: Left;   AV FISTULA PLACEMENT Left 08/19/2018  Procedure: ARTERIOVENOUS (AV) FISTULA CREATION ( BRACHIOBASILIC );  Surgeon: Jama Cordella MATSU, MD;  Location: [facility];  Service: Vascular;  Laterality: Left;   BASCILIC VEIN TRANSPOSITION Left 11/18/2018    Procedure: BASCILIC VEIN TRANSPOSITION;  Surgeon: Jama Cordella MATSU, MD;  Location: [facility];  Service: Vascular;  Laterality: Left;   BIOPSY   09/20/2020    Procedure: BIOPSY;  Surgeon: Rollin Dover, MD;  Location: [facility] ENDOSCOPY;  Service: Endoscopy;;   CHOLECYSTECTOMY       COLONOSCOPY WITH PROPOFOL  N/A 02/03/2018    Procedure: COLONOSCOPY WITH PROPOFOL ;  Surgeon: Unk Corinn Skiff, MD;  Location: [facility] ENDOSCOPY;  Service: Gastroenterology;  Laterality: N/A;   COLONOSCOPY WITH PROPOFOL  N/A 07/25/2022    Procedure: COLONOSCOPY WITH PROPOFOL ;  Surgeon: Maryruth Ole DASEN, MD;  Location: [facility] ENDOSCOPY;  Service: Endoscopy;  Laterality: N/A;   CORONARY ANGIOPLASTY   07/2015    STENT   CORONARY STENT INTERVENTION N/A  09/18/2016    Procedure: Coronary Stent Intervention;  Surgeon: Debby DELENA Sor, MD;  [facility]: [facility];  Service: Cardiovascular;  Laterality: N/A;   DIALYSIS/PERMA CATHETER INSERTION N/A 05/10/2018    Procedure: DIALYSIS/PERMA CATHETER INSERTION;  Surgeon: Jama Cordella MATSU, MD;  Location: [facility];  Service: Cardiovascular;  Laterality: N/A;   DRESSING CHANGE UNDER ANESTHESIA Left 08/15/2017    Procedure: exploration of wound for bleeding;  Surgeon: Dessa Reyes ORN, MD;  Location: [facility];  Service: General;  Laterality: Left;   ESOPHAGOGASTRODUODENOSCOPY (EGD) WITH PROPOFOL  N/A 02/03/2018    Procedure: ESOPHAGOGASTRODUODENOSCOPY (EGD) WITH PROPOFOL ;  Surgeon: Unk Corinn Skiff, MD;  Location: [facility] ENDOSCOPY;  Service: Gastroenterology;  Laterality: N/A;   ESOPHAGOGASTRODUODENOSCOPY (EGD) WITH PROPOFOL  N/A 09/20/2020    Procedure: ESOPHAGOGASTRODUODENOSCOPY (EGD) WITH PROPOFOL ;  Surgeon: Rollin Dover, MD;  Location: [facility] ENDOSCOPY;  Service: Endoscopy;  Laterality: N/A;   ESOPHAGOGASTRODUODENOSCOPY (EGD) WITH PROPOFOL  N/A 07/25/2022    Procedure: ESOPHAGOGASTRODUODENOSCOPY (EGD) WITH PROPOFOL ;  Surgeon: Maryruth Ole DASEN, MD;  Location: [facility] ENDOSCOPY;  Service: Endoscopy;  Laterality: N/A;   EYE SURGERY   11/17/2018   INCISION AND DRAINAGE ABSCESS Left 08/12/2017    Procedure: INCISION AND DRAINAGE ABSCESS;  Surgeon: Dessa Reyes ORN, MD;  Location: [facility];  Service: General;  Laterality: Left;   KNEE ARTHROSCOPY       LEFT HEART CATH N/A 09/18/2016    Procedure: Left Heart Cath;  Surgeon: Debby DELENA Sor, MD;  [facility];  Service: Cardiovascular;  Laterality: N/A;   LEFT HEART CATH AND CORONARY ANGIOGRAPHY N/A 09/16/2016    Procedure: Left Heart Cath and Coronary Angiography;  Surgeon: Lonni JONETTA Cash, MD;  [facility] CV LAB;  Service: Cardiovascular;  Laterality: N/A;   LEFT HEART CATH AND CORONARY ANGIOGRAPHY N/A 04/29/2017     Procedure: LEFT HEART CATH AND CORONARY ANGIOGRAPHY;  Surgeon: Mady Lonni, MD;  [facility]: [facility];  Service: Cardiovascular;  Laterality: N/A;   LOWER EXTREMITY ANGIOGRAPHY Right 03/08/2018    Procedure: LOWER EXTREMITY ANGIOGRAPHY;  Surgeon: Jama Cordella MATSU, MD;  Location: [facility];  Service: Cardiovascular;  Laterality: Right;   TUBAL LIGATION       TUBAL LIGATION       UPPER EXTREMITY ANGIOGRAPHY Right 09/19/2019    Procedure: UPPER EXTREMITY ANGIOGRAPHY;  Surgeon: Jama Cordella MATSU, MD;  Location: [facility];  Service: Cardiovascular;  Laterality: Right;   WOUND DEBRIDEMENT Left 12/21/2018    Procedure: DEBRIDEMENT WOUND;  Surgeon: Jama Cordella MATSU,  MD;  Location: [facility] OR[facility];  Service: Vascular;  Laterality: Left;   WOUND DEBRIDEMENT Left 12/30/2018    Procedure: DEBRIDEMENT WOUND WITH VAC PLACEMENT (LEFT UPPER EXTREMITY);  Surgeon: Jama Cordella MATSU, MD;  Location: [facility];  Service: Vascular;  Laterality: Left;          Allergies       Allergies  Allergen Reactions   Shellfish Allergy Anaphylaxis and Swelling   Diazepam  Other (See Comments)      felt like out of body experience   Morphine  Itching and Other (See Comments)      Tolerated hydromorphone  on 07/2020 *will take along with Benadryl *               Prior to Admission medications   Medication Sig Start Date End Date Taking? Authorizing Provider  albuterol  (VENTOLIN  HFA) 108 (90 Base) MCG/ACT inhaler Inhale 2 puffs into the lungs every 4 (four) hours as needed for wheezing or shortness of breath. 06/28/20     Vivienne Delon HERO, PA-C  aspirin  81 MG EC tablet Take 1 tablet (81 mg total) by mouth daily. 09/20/20     Singh, Prashant K, MD  atorvastatin  (LIPITOR ) 80 MG tablet Take 1 tablet (80 mg total) by mouth every evening. Patient taking differently: Take 80 mg by mouth daily. 04/24/21     Bacigalupo, Angela M, MD  carvedilol  (COREG ) 12.5 MG tablet Take 1 tablet (12.5 mg total)  by mouth 2 (two) times daily with a meal. 09/19/21     Paige, Richerd PARAS, DO  Continuous Blood Gluc Receiver (FREESTYLE LIBRE 14 DAY READER) DEVI To check blood sugar ACHS 09/27/20     Vivienne Delon HERO, PA-C  Continuous Blood Gluc Sensor (FREESTYLE LIBRE 14 DAY SENSOR) MISC To check blood sugar ACHS; change every 14 days 09/27/20     Vivienne Delon HERO, PA-C  fluticasone  (FLONASE ) 50 MCG/ACT nasal spray Place 2 sprays into both nostrils daily as needed for allergies or rhinitis. 09/28/17     Vivienne Delon HERO, PA-C  fluticasone  furoate-vilanterol (BREO ELLIPTA ) 200-25 MCG/INH AEPB Inhale 1 puff into the lungs daily. 08/30/20     Vivienne Delon HERO, PA-C  Glucose Blood (BLOOD GLUCOSE TEST STRIPS 333) STRP Enough test stripes to test blood sugar TID x 30 days. No refills 04/16/22     Trudy Anthony HERO, MD  HUMULIN  70/30 KWIKPEN (70-30) 100 UNIT/ML KwikPen Inject 30 Units into the skin 2 (two) times daily. 05/26/22     [provider]  hydrOXYzine  (ATARAX ) 25 MG tablet Take 1 tablet (25 mg total) by mouth 2 (two) times daily. 11/17/22     Hongalgi, Anand D, MD  Insulin  Pen Needle 32G X 4 MM MISC Enough pen needles for 100 units of novolin  QAM x 30 days. No refills 04/16/22     Trudy Anthony HERO, MD  LEVEMIR  FLEXPEN 100 UNIT/ML FlexPen Inject 25 Units into the skin daily. 11/02/22     Harden Jerona GAILS, MD  lidocaine  (LIDODERM ) 5 % Place 1 patch onto the skin daily. Remove & Discard patch within 12 hours or as directed by MD 06/25/23     Nettie, April, MD  methocarbamol  (ROBAXIN ) 500 MG tablet Take 1 tablet (500 mg total) by mouth 2 (two) times daily. 11/02/22     Harden Jerona GAILS, MD  midodrine  (PROAMATINE ) 10 MG tablet Take 10 mg by mouth See admin instructions. Take 10 mg by mouth 30 minutes before dialysis on Tues/Thurs/Sat 12/23/21     [provider]  naloxone  (NARCAN ) nasal spray 4 mg/0.1 mL Place 1 spray into the nose as directed. 04/08/22     [provider]  nitroGLYCERIN   (NITRODUR - DOSED IN MG/24 HR) 0.2 mg/hr patch Place 1 patch (0.2 mg total) onto the skin daily. Apply a patch to the dorsum of the left ankle.  Change location daily. 11/17/22     Hongalgi, Anand D, MD  nitroGLYCERIN  (NITRODUR - DOSED IN MG/24 HR) 0.2 mg/hr patch Place 1 patch (0.2 mg total) onto the skin daily. Apply 1 patch to the dorsum of the ankle and change location daily.  Wash ankle and foot with soap and water daily dry before applying patch. 12/24/22 12/24/23   Harden Jerona GAILS, MD  nitroGLYCERIN  (NITROSTAT ) 0.4 MG SL tablet Place 1 tablet (0.4 mg total) under the tongue every 5 (five) minutes x 3 doses as needed for chest pain. 05/03/20     Vivienne Delon HERO, PA-C  Oxycodone  HCl 10 MG TABS Take 10 mg by mouth 4 (four) times daily. 04/01/22     [provider]  oxyCODONE -acetaminophen  (PERCOCET) 10-325 MG tablet Take 1 tablet by mouth every 6 (six) hours as needed for pain. 04/29/23     Laurice Maude BROCKS, MD  pantoprazole  (PROTONIX ) 40 MG tablet Take 40 mg by mouth daily before breakfast.       [provider]  pregabalin  (LYRICA ) 100 MG capsule TAKE 1 CAPSULE BY MOUTH TWICE A DAY 07/13/23     Harden Jerona GAILS, MD  torsemide  (DEMADEX ) 10 MG tablet Take 10 mg by mouth See admin instructions. Take 10 mg by mouth every other morning       [provider]  valACYclovir  (VALTREX ) 500 MG tablet TAKE (1) TABLET BY MOUTH EVERY OTHER DAY. Patient taking differently: Take 500 mg by mouth daily. 01/24/21     Bacigalupo, Angela M, MD  VELPHORO  500 MG chewable tablet Chew 1,500 mg by mouth 3 (three) times daily. 09/11/21     [provider]  gabapentin  (NEURONTIN ) 100 MG capsule Take 1 capsule (100 mg total) by mouth 3 (three) times daily. 08/19/17 02/03/19   Tobie Calix, MD      Social History         Socioeconomic History   Marital status: Divorced      Spouse name: Not on file   Number of children: 3   Years of education: Not on file   Highest education level: Bachelor's  degree (e.g., BA, AB, BS)  Occupational History   Occupation: disability  Tobacco Use   Smoking status: Former      Current packs/day: 0.00      Average packs/day: 0.3 packs/day for 6.0 years (1.5 ttl pk-yrs)      Types: Cigarettes      Start date: 10/26/1974      Quit date: 10/25/1980      Years since quitting: 43.2   Smokeless tobacco: Never  Vaping Use   Vaping status: Never Used  Substance and Sexual Activity   Alcohol use: Not Currently      Comment: rare   Drug use: Not Currently      Types: Marijuana   Sexual activity: Not on file      Comment: Not asked  Other Topics Concern   Not on file  Social History Narrative   Not on file    Social Drivers of Health        Financial Resource Strain: Low Risk  (12/01/2023)  Received from [facility]    Overall Financial Resource Strain (CARDIA)     Difficulty of Paying Living Expenses: Not hard at all  Food Insecurity: No Food Insecurity (12/01/2023)    Received from [facility]    Hunger Vital Sign     Within the past 12 months, you worried that your food would run out before you got the money to buy more.: Never true     Within the past 12 months, the food you bought just didn't last and you didn't have money to get more.: Never true  Transportation Needs: No Transportation Needs (12/01/2023)    Received from [facility]    PRAPARE - Transportation     Lack of Transportation (Medical): No     Lack of Transportation (Non-Medical): No  Physical Activity: Inactive (12/01/2023)    Received from [facility]    Exercise Vital Sign     On average, how many days per week do you engage in moderate to strenuous exercise (like a brisk walk)?: 0 days     On average, how many minutes do you engage in exercise at this level?: 0 min  Stress: No Stress Concern Present (12/01/2023)    Received from [facility]    [employer] of Occupational  Health - Occupational Stress Questionnaire     Feeling of Stress : Not at all  Social Connections: Unknown (12/01/2023)    Received from [facility]    Social Connection and Isolation Panel     In a typical week, how many times do you talk on the phone with family, friends, or neighbors?: Twice a week     How often do you get together with friends or relatives?: Twice a week     How often do you attend church or religious services?: Never     Do you belong to any clubs or organizations such as church groups, unions, fraternal or athletic groups, or school groups?: No     How often do you attend meetings of the clubs or organizations you belong to?: Never     Are you married, widowed, divorced, separated, never married, or living with a partner?: Patient declined  Intimate Partner Violence: Not At Risk (12/01/2023)    Received from [employer], Afraid, Rape, and Kick questionnaire     Within the last year, have you been afraid of your partner or ex-partner?: No     Within the last year, have you been humiliated or emotionally abused in other ways by your partner or ex-partner?: No     Within the last year, have you been kicked, hit, slapped, or otherwise physically hurt by your partner or ex-partner?: No     Within the last year, have you been raped or forced to have any kind of sexual activity by your partner or ex-partner?: No         Family History  Problem Relation Age of Onset   Colon cancer Mother     Heart attack Other     Heart attack Maternal Grandmother     Hypertension Sister     Hypertension Brother     Diabetes Paternal Grandmother     Breast cancer Neg Hx            ROS: Otherwise negative unless mentioned in HPI   Physical Examination  Vitals:    01/21/24 1314  BP: 105/85  Pulse: 71  Resp: 12  Temp: 98 F (36.7 C)  SpO2: 90%    There is no height or weight on file to calculate BMI.   General:  WDWN  in NAD Gait: Not observed HENT: WNL, normocephalic Pulmonary: normal non-labored breathing, without Rales, rhonchi,  wheezing Cardiac: regular,  Abdomen:  soft, NT/ND, no masses Skin: without rashes Vascular Exam/Pulses: 2+ radial, palpable thrill  Extremities: without ischemic changes Musculoskeletal: no muscle wasting or atrophy       Neurologic: A&O X 3;  No focal weakness or paresthesias are detected; speech is fluent/normal Psychiatric:  The pt has Normal affect. Lymph:  Unremarkable   CBC Labs (Brief)          Component Value Date/Time    WBC 6.3 06/25/2023 0106    RBC 3.66 (L) 06/25/2023 0106    HGB 9.2 (L) 06/25/2023 0106    HGB 9.7 (L) 09/27/2020 1417    HCT 30.8 (L) 06/25/2023 0106    HCT 28.9 (L) 09/27/2020 1417    PLT 158 06/25/2023 0106    PLT 209 09/27/2020 1417    MCV 84.2 06/25/2023 0106    MCV 89 09/27/2020 1417    MCH 25.1 (L) 06/25/2023 0106    MCHC 29.9 (L) 06/25/2023 0106    RDW 17.8 (H) 06/25/2023 0106    RDW 14.5 09/27/2020 1417    LYMPHSABS 1.3 06/25/2023 0106    LYMPHSABS 1.5 09/27/2020 1417    MONOABS 0.5 06/25/2023 0106    EOSABS 0.2 06/25/2023 0106    EOSABS 0.2 09/27/2020 1417    BASOSABS 0.0 06/25/2023 0106    BASOSABS 0.0 09/27/2020 1417        BMET Labs (Brief)          Component Value Date/Time    NA 138 06/25/2023 0106    NA 139 09/27/2020 1417    K 4.8 06/25/2023 0106    CL 103 06/25/2023 0106    CO2 19 (L) 06/25/2023 0106    GLUCOSE 159 (H) 06/25/2023 0106    BUN 54 (H) 06/25/2023 0106    BUN 9 09/27/2020 1417    CREATININE 9.66 (H) 06/25/2023 0106    CREATININE 1.36 (H) 09/23/2016 1451    CALCIUM  8.5 (L) 06/25/2023 0106    GFRNONAA 4 (L) 06/25/2023 0106    GFRNONAA 43 (L) 09/23/2016 1451    GFRAA 6 (L) 01/31/2020 1858    GFRAA 49 (L) 09/23/2016 1451        COAGS: Recent Labs       Lab Results  Component Value Date    INR 1.1 04/14/2022    INR 1.1 09/17/2020    INR 0.9 04/04/2019               ASSESSMENT/PLAN: This is a 68 y.o. female with history of left arm brachiobasilic fistula.  She presents today after dialysis told her that her fistula was clotted.   After discussing the risks and benefits of left arm fistulogram, possible declot, Leather elected to proceed.       Fonda FORBES Rim MD MS [facility] [phone] 01/21/2024  1:35 PM

## 2024-02-15 NOTE — Progress Notes (Signed)
 Patient: [name] DOB: 03-14-1956 MRN: [id number] Note Type: Dialysis Rounds-Comp Service Date: 02/15/2024  This patient was personally seen face-to-face for a complete visit as part of routine monthly dialysis care for end stage renal disease.  Attending Nephrologist: ELIA QUINTIN DAWN Dialysis Location: [facility] Schedule: Tu-Th-Sa Shift: 1  ------------------------------ OVERVIEW ------------------------------  Patient is stable.  COMMENTS: 02/15/24: no new complaints today. labs from last month reviewed. no new labs yet, no changes today. she has an appt with [facility] for [facility] removal tomorrow, also says she has a nerve test tomorrow. RAG  02/08/24: Seen on HD< had de-clot and stent placement on 7/23 from TAC for high grade stenosis/mural thrombus of bv. apoprox 3.5l over edw, max UF today, bp stable, she misses a lot of tx's and makes up when she can, has not been on a consistent path of HD, discussed needs to adhere to HD days and if anything we needed to do to assist her. DC  02/02/24: Here for makeup, had TAC appt, awaiting results as she just left there, cannulated today w/o issues, reasonable fluid gains, semi-drowsy from TAC, bp stable. DC  01/20/24: Seen on HD, unfortunately was having difficultly cannulating and repeat x3 w/ clots, slatted for [place] (her preferred access intervention) tomorrow, encouraged to modulated fluid and K, and to make up tx. DC  PATIENT HISTORY COMMENTS: ESRD CAD s/p Stenting OSA HTN HLD T2DM GERD HFpEF Mild AS COPD lymphedema OA  ------------------------------ HOME MEDICATIONS ------------------------------  Medications reviewed.  COMMENTS: Needs to bring in update med list Increased torsemide  to 100mg  on non HD days Ozempic to 1mg  Amlodipine  to 10mg  Re-started Sensipar  to 30mg  Stopped lyrica  Started gabapentin  200mg   Current MedReview Outpatient  Medications ---------------------------------------- Accu-Chek Fastclix Lancet Drum Use 3 lancet to skin three times a day. [Check CBG 3 times daily]  aspirin  81 mg tablet,delayed release (DR/EC) Take 1 tablet by mouth once a day.  atorvastatin  80 mg tablet Take 1 tablet by mouth once a day. [take at 6 pm]  carvedilol  12.5 mg tablet Take 1 tablet by mouth twice a day with meals. [With Breakfast and supper]  Flonase  Allergy Relief 50 mcg/actuation spray,suspension Spray 2 spray into both nostrils once a day.  Fluticasone -furoate-velanterol 200-25 Inhale 1 puff as directed once a day.  FreeStyle Libre 14 Day Reader Use 4 device. [4 times daily before meals and at bedtime]  Humulin  70/30 U-100 KwikPen 100 unit/mL (70-30) insulin  pen Inject 30 subcutaneously once a day. [Use if fasting blood sugar >150.]  Humulin  N NPH Insulin  KwikPen 100 unit/mL (3 mL) insulin  pen Inject 100 subcutaneously every morning.  hydroxyzine  HCl 25 mg tablet Take 2 tablet by mouth three times a day as needed. [for itching]  Levemir  FlexPen 100 unit/mL (3 mL) insulin  pen Inject 25 subcutaneously once a day.  Methacarbamol 500 mg Take 1 tablet by mouth twice a day.  Narcan  4 mg/actuation spray,non-aerosol Spray 1 spray into one nostril as directed.  Nephro-Vite Rx 1-60-300 mg-mg-mcg tablet Take 1 tablet by mouth once a day.  nitroglycerin  0.4 mg tablet, sublingual Place 1 tablet under tongue as needed. [Place I tablet under the tongue every 5 minutesx3 doses as needed for chest pain]  Protonix  40 mg tablet,delayed release (DR/EC) Take 1 tablet by mouth once a day. [before breakfast]  Roxicodone  10 tablet Take 1 tablet by mouth every six hours as needed. [for severe pain]  valacyclovir  500 mg tablet Take 1 tablet by mouth every other day.  Ventolin   HFA 90 mcg/actuation HFA aerosol inhaler Inhale 2 puff using inhaler every four hours as needed. [for wheezing or shortness of  breath]  Current MedReview Allergies --------------------------- Allergen: Reaction: Unknown  Allergen: diazepam  Reaction: Unknown  Allergen: morphine  Reaction: Unknown  ------------------------------ DIALYSIS PRESCRIPTION ------------------------------  Treatment Data -------------- Treatment Date: 02/15/2024 started at: 10:33 AM Dialysate / Machine Temp (prescribed): 36.0*C Dialysate / Machine Temp (actual): 37.0*C BFR (prescribed): 450 BFR (actual): 300 DFR (prescribed): Autoflow 1.5 DFR (actual): 300 Prescribed Time: 04:30 EDW (kg): 100.8 Dialyzer: 180NRe Optiflux Dialysate: 2.0 K, 2.0 Ca, 1.0 Mg, 100 Dextrose  (G2201) Sodium: 137 Bicarb: 33  Pre Dialysis Vitals ------------------- Pre BP Sit: 135/57 Pre Wt (kg): 105.4 EDW Deviation (kg): 4.6 Temp: 98.0*F  Current Dialysis Vitals ----------------------- BP Sit: 137/59 AP/VP: -- Pulse: 80  ------------------------------ TREATMENT MEDICATIONS ORDERS ------------------------------  Cinacalcet  (Sensipar ) 60 mg ORAL 3X Week 12/23/2023 - 12/21/2024  Doxercalciferol  (Hectorol ) 4 mcg IVP 3X Week 11/25/2023 - 11/23/2024  Heparin  Sodium (Porcine) 1,000 Units/mL Catheter Lock Arterial 1600 units Arterial Red Port Every Treatment Post Dialysis 01/27/2024 - 01/25/2025  Heparin  Sodium (Porcine) 1,000 Units/mL Catheter Lock Venous 1600 units Venous Blue Port Every Treatment Post Dialysis 01/27/2024 - 01/25/2025  Heparin  Sodium (Porcine) 1,000 Units/mL Systemic 3000 units IVP Every Treatment 10/21/2023 - 10/19/2024  ------------------------------ BP AND FLUID ASSESSMENT ------------------------------  Acceptable blood pressure.  COMMENTS: not much fluid gain this time  Post BP Sit 122/47 - 02/10/2024 132/42 - 02/08/2024 140/57 - 02/05/2024  Post Wt (kg) 101.2 - 02/10/2024 100.9 - 02/08/2024 101.0 - 02/05/2024  EDW (kg) 100.8 - 02/10/2024 100.8 - 02/08/2024 100.8 - 02/05/2024  Deviation (kg) 0.4 -  02/10/2024 0.1 - 02/08/2024 0.2 - 02/05/2024  ------------------------------ ADEQUACY ASSESSMENT ------------------------------  spKt/V (Daugirdas II) 1.69 (01/13/24) 1.42 (12/18/23) 1.51 (12/16/23)  eKdrt/V 1.27 (12/18/23) 1.36 (12/16/23) 1.25 (11/18/23)  % Urea  Reduction 77 (01/13/24) 71 (12/18/23) 72 (12/16/23)  BUN 22 (01/13/24) 38 (12/18/23) 47 (12/16/23)  BUN Post Dialysis 5 (01/13/24) 11 (12/18/23) 13 (12/16/23)  Creatinine 9.88 (01/29/24) 8.37 (12/30/23) 9.28 (12/03/23)  Bicarbonate (CO2) 28 (01/29/24) 27 (12/30/23) 21 (12/03/23)  Sodium 137 (01/29/24) 139 (12/30/23) 140 (12/03/23)  Target met.  Missed Treatments 9 - Last 30 days 11 - Last 60 days Most recently missed on 02/14/2024 ------------------------------ ACCESS ASSESSMENT ------------------------------  Current access is permanent and functioning well.  COMMENTS: has LUE AVF, R chest TDC which is planned for removal tomorrow  CVCatheter Tunneled Chest Active (In Use) - 01/21/2024 Placed - 01/21/2024  AVFistula Standard Left Upper Arm Active (In Use) - 07/22/2019 Placed - 08/19/2018 Access Flow 1026 (02/15/24) 1369 (02/08/24) 1214 (02/05/24)  ------------------------------ ANEMIA ASSESSMENT ------------------------------  Hemoglobin 8.8 (02/10/24) 12.4 (01/13/24) 11.5 (01/08/24)  Iron  Saturation (TSat) 24 (01/29/24) 54 (12/30/23) 35 (12/03/23)  Ferritin 589 (01/29/24) 743 (12/30/23) 654 (12/03/23)  Iron  86 (01/29/24) 138 (12/30/23) 88 (12/03/23)  TIBC 358 (01/29/24) 255 (12/30/23) 255 (12/03/23)  Reticulocyte Hemoglobin 26.1 (01/29/24) 28.2 (01/08/24) 29.3 (12/16/23)  MCV 88 (12/30/23) 89 (12/03/23) 89 (10/28/23)  Folate 6.1 (05/12/23)  Vitamin B-12 889 (05/12/23)  Platelets 141 (12/30/23) 151 (12/03/23) 208 (10/28/23)  Anemia targets not met.  ------------------------------ BMM ASSESSMENT ------------------------------  Calcium  9.2 01/13/24 8.7 12/16/23 9.0 11/18/23  Corrected Calcium  8.8  01/13/24 8.7 12/16/23 8.8 11/18/23  Phosphorus 4.6 01/13/24 9.4 12/16/23 6.9 11/18/23  Calcium  Phosphorus Product 42 01/13/24 82 12/16/23 62 11/18/23  PTH 601 01/13/24 1,537 12/16/23 1,135 11/18/23  Vitamin D , 25-OH, Total 16.1 05/12/23  Magnesium  2.1 01/29/24 2.0 10/28/23 2.0 08/03/23  Aluminum  6 05/12/23  PTH within target. Phosphorus controlled. Calcium  controlled.  ------------------------------ NUTRITION ASSESSMENT ------------------------------  Albumin  4.5 01/13/24 4.0 12/16/23 4.2 11/18/23  Potassium 3.9 01/13/24 5.3 12/16/23 5.8 11/18/23  eNPCR 0.71 12/18/23 0.82 12/16/23 0.99 11/18/23  Hemoglobin A1C 6.9 10/28/23 6.8 08/03/23 8.8 05/12/23  Albumin  at goal. Potassium controlled.  ------------------------------ PHYSICAL EXAM ------------------------------  Exam performed.  Vital Signs Reviewed. Lungs - Clear. CV - Blood pressure noted. CV - RRR. No edema.  ------------------------------ ADDITIONAL LABS ------------------------------  WBC 4.56 (12/30/23) 5.70 (12/03/23) 4.98 (10/28/23)  Hepatitis B Surface Ab >1,000 (08/03/23) <10 (05/12/23)   Signed by: ELIA QUINTIN DAWN, MD on 02/15/2024 at 11:24:07 AM Transcribed by: ELIA QUINTIN DAWN, MD on 02/15/2024 at 11:24:07 AM

## 2024-02-24 NOTE — Progress Notes (Signed)
 ------------------------------ BASIC NOTE ------------------------------  Patient: [name] DOB: 09/02/1955 MRN: [id number] Note Author: COATS, DUSTIN B, NP-C Service Date: 02/24/2024  This patient was personally seen face-to-face for a basic visit as part of routine monthly dialysis care for end stage renal disease.  Attending Nephrologist: ELIA QUINTIN DAWN Dialysis Location: [facility] Schedule: Tu-Th-Sa Shift: 2  ------------------------------ OVERVIEW ------------------------------  COMMENTS: 02/24/24: Seen on HD, hgb had large 50% drop from 12.4 to 8.8, repeat was slightly lower, at 8.3, denies melena, had some weakness today but reports didn't start until end og HD tx, told to go to ED if continues, mircera just started less than 2 weeks. Had been on higher doses previous months. Repeat today. DC  02/15/24: no new complaints today. labs from last month reviewed. no new labs yet, no changes today. she has an appt with TAC for [facility] removal tomorrow, also says she has a nerve test tomorrow. RAG  02/08/24: Seen on HD< had de-clot and stent placement on 7/23 from TAC for high grade stenosis/mural thrombus of bv. apoprox 3.5l over edw, max UF today, bp stable, she misses a lot of tx's and makes up when she can, has not been on a consistent path of HD, discussed needs to adhere to HD days and if anything we needed to do to assist her. DC  02/02/24: Here for makeup, had [facility]AC appt, awaiting results as she just left there, cannulated today w/o issues, reasonable fluid gains, semi-drowsy from TAC, bp stable. DC  01/20/24: Seen on HD, unfortunately was having difficultly cannulating and repeat x3 w/ clots, slatted for [place] (her preferred access intervention) tomorrow, encouraged to modulated fluid and K, and to make up tx. DC  PATIENT HISTORY COMMENTS: ESRD CAD s/p Stenting OSA HTN HLD T2DM GERD HFpEF Mild  AS COPD lymphedema OA  ------------------------------ HOME MEDICATIONS ------------------------------  COMMENTS: Needs to bring in update med list Increased torsemide  to 100mg  on non HD days Ozempic to 1mg  Amlodipine  to 10mg  Re-started Sensipar  to 30mg  Stopped lyrica  Started gabapentin  200mg   Current MedReview Outpatient Medications ---------------------------------------- Accu-Chek Fastclix Lancet Drum Use 3 lancet to skin three times a day. [Check CBG 3 times daily]  aspirin  81 mg tablet,delayed release (DR/EC) Take 1 tablet by mouth once a day.  atorvastatin  80 mg tablet Take 1 tablet by mouth once a day. [take at 6 pm]  carvedilol  12.5 mg tablet Take 1 tablet by mouth twice a day with meals. [With Breakfast and supper]  Flonase  Allergy Relief 50 mcg/actuation spray,suspension Spray 2 spray into both nostrils once a day.  Fluticasone -furoate-velanterol 200-25 Inhale 1 puff as directed once a day.  FreeStyle Libre 14 Day Reader Use 4 device. [4 times daily before meals and at bedtime]  Humulin  70/30 U-100 KwikPen 100 unit/mL (70-30) insulin  pen Inject 30 subcutaneously once a day. [Use if fasting blood sugar >150.]  Humulin  N NPH Insulin  KwikPen 100 unit/mL (3 mL) insulin  pen Inject 100 subcutaneously every morning.  hydroxyzine  HCl 25 mg tablet Take 2 tablet by mouth three times a day as needed. [for itching]  Levemir  FlexPen 100 unit/mL (3 mL) insulin  pen Inject 25 subcutaneously once a day.  Methacarbamol 500 mg Take 1 tablet by mouth twice a day.  Narcan  4 mg/actuation spray,non-aerosol Spray 1 spray into one nostril as directed.  Nephro-Vite Rx 1-60-300 mg-mg-mcg tablet Take 1 tablet by mouth once a day.  Nitro-Dur  0.2 mg/hr patch 24 hour Apply 1 patch to skin once a day. [to dorsum of  left ankle]  nitroglycerin  0.4 mg tablet, sublingual Place 1 tablet under tongue as needed. [Place I tablet under the tongue every 5 minutesx3 doses as needed  for chest pain]  Protonix  40 mg tablet,delayed release (DR/EC) Take 1 tablet by mouth once a day. [before breakfast]  Roxicodone  10 tablet Take 1 tablet by mouth every six hours as needed. [for severe pain]  valacyclovir  500 mg tablet Take 1 tablet by mouth every other day.  Ventolin  HFA 90 mcg/actuation HFA aerosol inhaler Inhale 2 puff using inhaler every four hours as needed. [for wheezing or shortness of breath]  Current MedReview Allergies --------------------------- Allergen: Reaction: Unknown  Allergen: diazepam  Reaction: Unknown  Allergen: morphine  Reaction: Unknown  ------------------------------ DIALYSIS PRESCRIPTION ------------------------------  Treatment Data -------------- Treatment Date: 02/24/2024 started at: 9:41 AM Dialysate / Machine Temp (prescribed): 36.0*C Dialysate / Machine Temp (actual): -- BFR (prescribed): 450 BFR (actual): 200 DFR (prescribed): Autoflow 1.5 DFR (actual): 800 Prescribed Time: 04:30 EDW (kg): 100.8 Dialyzer: -- Dialysate: -- Sodium: 137 Bicarb: 33  Pre Dialysis Vitals ------------------- Pre BP Sit: 131/55 Pre Wt (kg): 105.5 EDW Deviation (kg): 4.7 Temp: 97.5*F  Current Dialysis Vitals ----------------------- BP Sit: 138/58 AP/VP: -- Pulse: 78  ------------------------------ TREATMENT MEDICATIONS ORDERS ------------------------------  Cinacalcet  (Sensipar ) 60 mg ORAL 3X Week 12/23/2023 - 12/21/2024  Heparin  Sodium (Porcine) 1,000 Units/mL Systemic 3000 units IVP Every Treatment 10/21/2023 - 10/19/2024  Mircera 75 mcg IVP Every 2 weeks During Dialysis 02/15/2024 - 02/13/2025  ------------------------------ BP AND FLUID ASSESSMENT ------------------------------  COMMENTS: not much fluid gain this time  Post BP Sit 149/60 - 02/22/2024 117/55 - 02/19/2024 132/58 - 02/17/2024  Post Wt (kg) 103.0 - 02/22/2024 102.5 - 02/19/2024 101.8 - 02/17/2024  EDW (kg) 100.8 - 02/22/2024 100.8 -  02/19/2024 100.8 - 02/17/2024  Deviation (kg) 2.2 - 02/22/2024 1.7 - 02/19/2024 1.0 - 02/17/2024  ------------------------------ ADEQUACY ASSESSMENT ------------------------------  spKt/V (Daugirdas II) 1.69 (02/17/24) 1.69 (01/13/24) 1.42 (12/18/23)  eKdrt/V 1.52 (02/17/24) 1.27 (12/18/23) 1.36 (12/16/23)  % Urea  Reduction 76 (02/17/24) 77 (01/13/24) 71 (12/18/23)  BUN 46 (02/17/24) 22 (01/13/24) 38 (12/18/23)  BUN Post Dialysis 11 (02/17/24) 5 (01/13/24) 11 (12/18/23)  Creatinine 9.88 (01/29/24) 8.37 (12/30/23) 9.28 (12/03/23)  Bicarbonate (CO2) 28 (01/29/24) 27 (12/30/23) 21 (12/03/23)  Sodium 137 (01/29/24) 139 (12/30/23) 140 (12/03/23)  Missed Treatments 8 - Last 30 days 11 - Last 60 days Most recently missed on 02/18/2024 ------------------------------ ACCESS ASSESSMENT ------------------------------  COMMENTS: has LUE AVF, R chest [facility] which is planned for removal tomorrow  AVFistula Standard Left Upper Arm Active (In Use) - 07/22/2019 Placed - 08/19/2018 Access Flow 771 (02/24/24) 942 (02/19/24) 1026 (02/15/24)  ------------------------------ ANEMIA ASSESSMENT ------------------------------  Hemoglobin 8.3 (02/17/24) 8.8 (02/10/24) 12.4 (01/13/24)  Iron  Saturation (TSat) 24 (01/29/24) 54 (12/30/23) 35 (12/03/23)  Ferritin 589 (01/29/24) 743 (12/30/23) 654 (12/03/23)  Iron  86 (01/29/24) 138 (12/30/23) 88 (12/03/23)  TIBC 358 (01/29/24) 255 (12/30/23) 255 (12/03/23)  Reticulocyte Hemoglobin 26.1 (01/29/24) 28.2 (01/08/24) 29.3 (12/16/23)  MCV 88 (12/30/23) 89 (12/03/23) 89 (10/28/23)  Folate 6.1 (05/12/23)  Vitamin B-12 889 (05/12/23)  Platelets 141 (12/30/23) 151 (12/03/23) 208 (10/28/23)  ------------------------------ BMM ASSESSMENT ------------------------------  Calcium  8.9 02/17/24 9.2 01/13/24 8.7 12/16/23  Corrected Calcium  8.7 02/17/24 8.8 01/13/24 8.7 12/16/23  Phosphorus 5.3 02/17/24 4.6 01/13/24 9.4 12/16/23  Calcium  Phosphorus Product 47 02/17/24 42  01/13/24 82 12/16/23  PTH 892 02/17/24 601 01/13/24 1,537 12/16/23  Vitamin D , 25-OH, Total 16.1 05/12/23  Magnesium  2.1 01/29/24 2.0 10/28/23 2.0 08/03/23  Aluminum 6 05/12/23  ------------------------------  NUTRITION ASSESSMENT ------------------------------  Albumin  4.3 02/17/24 4.5 01/13/24 4.0 12/16/23  Potassium 4.8 02/17/24 3.9 01/13/24 5.3 12/16/23  eNPCR 0.86 02/17/24 0.71 12/18/23 0.82 12/16/23  Hemoglobin A1C 6.9 10/28/23 6.8 08/03/23 8.8 05/12/23  ------------------------------ ADDITIONAL LABS ------------------------------  WBC 4.56 (12/30/23) 5.70 (12/03/23) 4.98 (10/28/23)  Hepatitis B Surface Ab >1,000 (08/03/23) <10 (05/12/23)   Signed by: MONTE GEARING B, NP-C on 02/24/2024 at 02:00:53 PM Transcribed by: COATS, DUSTIN B, NP-C on 02/24/2024 at 02:00:53 PM

## 2024-02-28 NOTE — ED Triage Notes (Signed)
 Pt arrives to ER from home. Transported to room via wheelchair. I fell back in June and my back is still hurting. And my knees are bone on bone and they are hurting me so bad. I need knee replacements but I have to find a doctor first.

## 2024-02-28 NOTE — ED Provider Notes (Signed)
 ------------------------------------------------------------------------------- Attestation signed by Alm JONELLE Buoy, DO at 02/28/2024  1:44 PM ED Attending Attestation  I attest that I have seen and examined the patient.  I have discussed the patient's presentation and plan of care with the resident. I agree with the history, physical, medical decision making, and plan as documented. I have personally reviewed all imaging, labs and ECGs. I have reviewed their documentation and agree with the plan of care.  Code: GC modifier  Alm JONELLE Buoy, DO   [phone] female presenting with persistent back pain and bilateral knee pain, seems to have been present and persistent since a ground level fall in May/June. No loss of bowel/bladder control, no fevers, chills, drug use or cancer history. She has been taking Tylenol , advil , and previously prescribed oxycodone . She has reassuring vitals here. Neurovasculary intact with atraumatic exam of the knees without apparent swelling or joint effusions. Reviewed her records which demonstrated previous imaging of both knees since May, CT of the C-,T-,and L-spine in May without acute traumatic injury. Cervical and thoracic X-rays performed several days ago as an outpatient. PDMP indicates prescription for oxycodone  10s. History of ESRD. No red flag signs or symptoms to indicate concern for epidural abscess, hematoma, cauda equina, cord compression, acute aortic syndrome, or traumatic spinal injury.  I do not see indication for ED MRI or additional, repeat imaging of the spine or knees.  Appropriate for outpatient symptomatic care.  There is some limitation given her end-stage renal disease regarding use of NSAIDs, but believe appropriate use topical diclofenac , Robaxin  as she has previously been on this.  Will give clear instructions for close follow-up outpatient with primary care and return precautions to the  ER. -------------------------------------------------------------------------------  HPI:   Chief Complaint  Patient presents with  . Back Pain  . Knee Pain    [name] is a 68 y.o. female presenting for back and bilateral knee pain. Pt reports she fell two months ago and began having back pain. Her pain has not improved since onset. She localizes her pain midline between the lumbar and thoracic spine. Pt reports pain worsens with movement and radiates to her buttocks. She denies saddle anesthesia, urinary/fecal incontinence, cancer hx, fever. Pt reports bilateral knee pain as well. She notes having bilateral bone-on-bone arthritis of her knees. She says she needs knee replacements. Pt is prescribed 10 mg oxycodone  for pain management.               Glasgow Coma Scale Score: 15                 Patient History:   Past Medical History:  Diagnosis Date  . Arthritis   . Asthma   . Diabetes mellitus (CMS/HCC)   . Dialysis patient (CMS/HCC)   . ESRD (end stage renal disease) (CMS/HCC)   . Heart disease   . High cholesterol   . Hypertension   . Kidney failure   . Necrotizing fasciitis (CMS/HCC)   . Neuropathy   . Obese   . Renal disorder     Past Surgical History:  Procedure Laterality Date  . CARDIAC SURGERY    . CAROTID STENT    . CHOLECYSTECTOMY    . COLOSTOMY    . TOE SURGERY Left   . TUBAL LIGATION    . WOUND DEBRIDEMENT      Family History  Problem Relation Age of Onset  . Heart disease Mother   . Heart disease Father   . Heart disease Sister   .  Diabetes Sister   . Diabetes Brother   . Diabetes Father's Sister   . Heart disease Maternal Grandfather     Social History   Tobacco Use  . Smoking status: Never  . Smokeless tobacco: Never  Vaping Use  . Vaping status: Never Used  Substance Use Topics  . Alcohol use: Never  . Drug use: Never     Review of Systems:   Review of Systems  Constitutional: Negative.   HENT: Negative.    Eyes:  Negative.   Respiratory: Negative.    Cardiovascular: Negative.   Gastrointestinal: Negative.   Endocrine: Negative.   Genitourinary: Negative.   Musculoskeletal:  Positive for arthralgias (bilateral knee pain) and back pain.  Skin: Negative.   Allergic/Immunologic: Negative.   Neurological: Negative.   Hematological: Negative.   Psychiatric/Behavioral: Negative.       Physical Exam:   ED Triage Vitals [02/28/24 0850]  Temp Heart Rate Resp BP  36.8 C (98.3 F) 84 16 138/68    SpO2 Temp Source Heart Rate Source Patient Position  97 % Oral Monitor Sitting    BP Location FiO2 (%)    Right arm --      Physical Exam Constitutional:      Appearance: Normal appearance.  HENT:     Head: Normocephalic and atraumatic.  Cardiovascular:     Rate and Rhythm: Normal rate and regular rhythm.  Pulmonary:     Effort: Pulmonary effort is normal.     Breath sounds: Normal breath sounds.  Abdominal:     General: There is no distension.     Palpations: Abdomen is soft.     Tenderness: There is no abdominal tenderness.  Skin:    General: Skin is warm and dry.  Neurological:     General: No focal deficit present.     Mental Status: She is alert and oriented to person, place, and time.  Psychiatric:        Mood and Affect: Mood normal.        Behavior: Behavior normal.      ED Course & MDM   Clinical Impressions as of 02/28/24 1032  Chronic midline low back pain without sciatica    Vital Signs Temp: 36.8 C (98.3 F) (02/28/2024  8:50 AM) Temp Source: Oral (02/28/2024  8:50 AM) Heart Rate: 84 (02/28/2024  8:50 AM) Heart Rate Source: Monitor (02/28/2024  8:50 AM) Resp: 16 (02/28/2024  8:50 AM) Resp Rate Source: Counted (02/28/2024  8:50 AM) BP: 138/68 (02/28/2024  8:50 AM) BP MAP: 102 (02/28/2024  8:50 AM) Calculated BP MAP: 91 (02/28/2024  8:50 AM) BP Location: Right arm (02/28/2024  8:50 AM) BP Method: Automatic (02/28/2024  8:50 AM) Patient Position: Sitting (02/28/2024  8:50  AM) Level of Consciousness-NURSES ONLY: Alert (02/28/2024  8:53 AM)    Medical Decision Making [name] is a 68 y.o. female presenting with back and bilateral knee pain. Pt has been having pain since May and has gotten several images of her spine since her fall. There is low suspicion for any new findings if repeat imaging were to be obtained. Pt pain and symptoms are already being managed outpatient. Pt given Tylenol  and robaxin  in ED to help reduce pain. Will d/c pt with robaxin , tylenol , and voltaren  gel and have her follow up with PCP and ortho to continue management.   Risk OTC drugs. Prescription drug management.       Sepsis Provider Doc     Dong-Jin Fonda Ruth, MD Resident  02/28/24 1126    Dong-Jin Fonda Ruth, MD Resident 02/28/24 [phone]

## 2024-02-29 NOTE — Progress Notes (Signed)
 ------------------------------ BASIC NOTE ------------------------------  Patient: [name] DOB: 11/30/1955 MRN: [id number] Note Author: COATS, DUSTIN B, NP-C Service Date: 02/29/2024  This patient was personally seen face-to-face for a basic visit as part of routine monthly dialysis care for end stage renal disease.  Attending Nephrologist: ELIA QUINTIN DAWN Dialysis Location: [facility] Schedule: Tu-Th-Sa Shift: 2  ------------------------------ OVERVIEW ------------------------------  COMMENTS: 02/29/24: Has missed Saturday and makeup up yesterday. She had gone for lower back pain and cleared for any red flag issues and to be manage symptomatically OP. 6+L over edw, max UF as tolerated today, appears drowsy, difficult to carry a linear conversation, which at times is er baseline. BP stable. Asked to makeup and have further fluid removal. DC  02/24/24: Seen on HD, hgb had large 50% drop from 12.4 to 8.8, repeat was slightly lower, at 8.3, denies melena, had some weakness today but reports didn't start until end og HD tx, told to go to ED if continues, mircera just started less than 2 weeks. Had been on higher doses previous months. Repeat today. DC  02/15/24: no new complaints today. labs from last month reviewed. no new labs yet, no changes today. she has an appt with [facility] for [facility] removal tomorrow, also says she has a nerve test tomorrow. RAG  02/08/24: Seen on HD< had de-clot and stent placement on 7/23 from [facility] for high grade stenosis/mural thrombus of bv. apoprox 3.5l over edw, max UF today, bp stable, she misses a lot of tx's and makes up when she can, has not been on a consistent path of HD, discussed needs to adhere to HD days and if anything we needed to do to assist her. DC  02/02/24: Here for makeup, had [facility] appt, awaiting results as she just left there, cannulated today w/o issues, reasonable fluid gains, semi-drowsy from [facility], bp stable.  DC  01/20/24: Seen on HD, unfortunately was having difficultly cannulating and repeat x3 w/ clots, slatted for [place] (her preferred access intervention) tomorrow, encouraged to modulated fluid and K, and to make up tx. DC  PATIENT HISTORY COMMENTS: ESRD CAD s/p Stenting OSA HTN HLD T2DM GERD HFpEF Mild AS COPD lymphedema OA  ------------------------------ HOME MEDICATIONS ------------------------------  COMMENTS: Needs to bring in update med list Increased torsemide  to 100mg  on non HD days Ozempic to 1mg  Amlodipine  to 10mg  Re-started Sensipar  to 30mg  Stopped lyrica  Started gabapentin  200mg   Current MedReview Outpatient Medications ---------------------------------------- Accu-Chek Fastclix Lancet Drum Use 3 lancet to skin three times a day. [Check CBG 3 times daily]  aspirin  81 mg tablet,delayed release (DR/EC) Take 1 tablet by mouth once a day.  atorvastatin  80 mg tablet Take 1 tablet by mouth once a day. [take at 6 pm]  carvedilol  12.5 mg tablet Take 1 tablet by mouth twice a day with meals. [With Breakfast and supper]  Flonase  Allergy Relief 50 mcg/actuation spray,suspension Spray 2 spray into both nostrils once a day.  Fluticasone -furoate-velanterol 200-25 Inhale 1 puff as directed once a day.  FreeStyle Libre 14 Day Reader Use 4 device. [4 times daily before meals and at bedtime]  Humulin  70/30 U-100 KwikPen 100 unit/mL (70-30) insulin  pen Inject 30 subcutaneously once a day. [Use if fasting blood sugar >150.]  Humulin  N NPH Insulin  KwikPen 100 unit/mL (3 mL) insulin  pen Inject 100 subcutaneously every morning.  hydroxyzine  HCl 25 mg tablet Take 2 tablet by mouth three times a day as needed. [for itching]  Levemir  FlexPen 100 unit/mL (3 mL) insulin  pen Inject  25 subcutaneously once a day.  Methacarbamol 500 mg Take 1 tablet by mouth twice a day.  Narcan  4 mg/actuation spray,non-aerosol Spray 1 spray into one nostril as  directed.  Nephro-Vite Rx 1-60-300 mg-mg-mcg tablet Take 1 tablet by mouth once a day.  Nitro-Dur  0.2 mg/hr patch 24 hour Apply 1 patch to skin once a day. [to dorsum of left ankle]  nitroglycerin  0.4 mg tablet, sublingual Place 1 tablet under tongue as needed. [Place I tablet under the tongue every 5 minutesx3 doses as needed for chest pain]  Protonix  40 mg tablet,delayed release (DR/EC) Take 1 tablet by mouth once a day. [before breakfast]  Roxicodone  10 tablet Take 1 tablet by mouth every six hours as needed. [for severe pain]  valacyclovir  500 mg tablet Take 1 tablet by mouth every other day.  Ventolin  HFA 90 mcg/actuation HFA aerosol inhaler Inhale 2 puff using inhaler every four hours as needed. [for wheezing or shortness of breath]  Current MedReview Allergies --------------------------- Allergen: Reaction: Unknown  Allergen: diazepam  Reaction: Unknown  Allergen: morphine  Reaction: Unknown  ------------------------------ DIALYSIS PRESCRIPTION ------------------------------  Treatment Data -------------- Treatment Date: 02/29/2024 started at: 10:11 AM Dialysate / Machine Temp (prescribed): 36.0*C Dialysate / Machine Temp (actual): 37.0*C BFR (prescribed): 450 BFR (actual): 400 DFR (prescribed): Autoflow 1.5 DFR (actual): 600 Prescribed Time: 04:30 EDW (kg): 100.8 Dialyzer: 180NRe Optiflux Dialysate: 2.0 K, 2.0 Ca, 1.0 Mg, 100 Dextrose  (G2201) Sodium: 137 Bicarb: 33  Pre Dialysis Vitals ------------------- Pre BP Sit: 135/60 Pre Wt (kg): 107.0 EDW Deviation (kg): 6.2 Temp: 97.6*F  Current Dialysis Vitals ----------------------- BP Sit: 122/57 AP/VP: 150/162 Pulse: 78  ------------------------------ TREATMENT MEDICATIONS ORDERS ------------------------------  Cinacalcet  (Sensipar ) 60 mg ORAL 3X Week 12/23/2023 - 12/21/2024  Doxercalciferol  (Hectorol ) 5 mcg IVP 3X Week 02/24/2024 - 02/22/2025  Heparin  Sodium (Porcine) 1,000 Units/mL  Systemic 3000 units IVP Every Treatment 10/21/2023 - 10/19/2024  Mircera 75 mcg IVP Every 2 weeks During Dialysis 02/15/2024 - 02/13/2025  ------------------------------ BP AND FLUID ASSESSMENT ------------------------------  COMMENTS: not much fluid gain this time  Post BP Sit 138/58 - 02/24/2024 149/60 - 02/22/2024 117/55 - 02/19/2024  Post Wt (kg) 103.7 - 02/24/2024 103.0 - 02/22/2024 102.5 - 02/19/2024  EDW (kg) 100.8 - 02/24/2024 100.8 - 02/22/2024 100.8 - 02/19/2024  Deviation (kg) 2.9 - 02/24/2024 2.2 - 02/22/2024 1.7 - 02/19/2024  ------------------------------ ADEQUACY ASSESSMENT ------------------------------  spKt/V (D[place]irdas II) 1.69 (02/17/24) 1.69 (01/13/24) 1.42 (12/18/23)  eKdrt/V 1.52 (02/17/24) 1.27 (12/18/23) 1.36 (12/16/23)  % Urea  Reduction 76 (02/17/24) 77 (01/13/24) 71 (12/18/23)  BUN 46 (02/17/24) 22 (01/13/24) 38 (12/18/23)  BUN Post Dialysis 11 (02/17/24) 5 (01/13/24) 11 (12/18/23)  Creatinine 9.88 (01/29/24) 8.37 (12/30/23) 9.28 (12/03/23)  Bicarbonate (CO2) 28 (01/29/24) 27 (12/30/23) 21 (12/03/23)  Sodium 137 (01/29/24) 139 (12/30/23) 140 (12/03/23)  Missed Treatments 8 - Last 30 days 13 - Last 60 days Most recently missed on 02/28/2024 ------------------------------ ACCESS ASSESSMENT ------------------------------  COMMENTS: has LUE AVF, R chest [facility] which is planned for removal tomorrow  AVFistula Standard Left Upper Arm Active (In Use) - 07/22/2019 Placed - 08/19/2018 Access Flow 771 (02/24/24) 942 (02/19/24) 1026 (02/15/24)  ------------------------------ ANEMIA ASSESSMENT ------------------------------  Hemoglobin 8.6 (02/24/24) 8.3 (02/17/24) 8.8 (02/10/24)  Iron  Saturation (TSat) 24 (01/29/24) 54 (12/30/23) 35 (12/03/23)  Ferritin 589 (01/29/24) 743 (12/30/23) 654 (12/03/23)  Iron  86 (01/29/24) 138 (12/30/23) 88 (12/03/23)  TIBC 358 (01/29/24) 255 (12/30/23) 255 (12/03/23)  Reticulocyte Hemoglobin 26.1 (01/29/24) 28.2  (01/08/24) 29.3 (12/16/23)  MCV 88 (12/30/23) 89 (12/03/23) 89 (10/28/23)  Folate  6.1 (05/12/23)  Vitamin B-12 889 (05/12/23)  Platelets 141 (12/30/23) 151 (12/03/23) 208 (10/28/23)  ------------------------------ BMM ASSESSMENT ------------------------------  Calcium  8.9 02/17/24 9.2 01/13/24 8.7 12/16/23  Corrected Calcium  8.7 02/17/24 8.8 01/13/24 8.7 12/16/23  Phosphorus 5.3 02/17/24 4.6 01/13/24 9.4 12/16/23  Calcium  Phosphorus Product 47 02/17/24 42 01/13/24 82 12/16/23  PTH 892 02/17/24 601 01/13/24 1,537 12/16/23  Vitamin D , 25-OH, Total 16.1 05/12/23  Magnesium  2.1 01/29/24 2.0 10/28/23 2.0 08/03/23  Aluminum 6 05/12/23  ------------------------------ NUTRITION ASSESSMENT ------------------------------  Albumin  4.3 02/17/24 4.5 01/13/24 4.0 12/16/23  Potassium 4.8 02/17/24 3.9 01/13/24 5.3 12/16/23  eNPCR 0.86 02/17/24 0.71 12/18/23 0.82 12/16/23  Hemoglobin A1C 6.9 10/28/23 6.8 08/03/23 8.8 05/12/23  ------------------------------ ADDITIONAL LABS ------------------------------  WBC 4.56 (12/30/23) 5.70 (12/03/23) 4.98 (10/28/23)  Hepatitis B Surface Ab >1,000 (08/03/23) <10 (05/12/23)   Signed by: MONTE GEARING B, NP-C on 02/29/2024 at 12:17:04 PM Transcribed by: COATS, DUSTIN B, NP-C on 02/29/2024 at 12:17:04 PM

## 2024-03-03 ENCOUNTER — Other Ambulatory Visit: Payer: Self-pay

## 2024-03-03 ENCOUNTER — Emergency Department (HOSPITAL_COMMUNITY)

## 2024-03-03 ENCOUNTER — Encounter (HOSPITAL_COMMUNITY): Payer: Self-pay

## 2024-03-03 ENCOUNTER — Inpatient Hospital Stay (HOSPITAL_COMMUNITY)
Admission: EM | Admit: 2024-03-03 | Discharge: 2024-03-08 | DRG: 640 | Disposition: A | Discharge status: Home Health | Attending: Family Medicine | Admitting: Family Medicine

## 2024-03-03 ENCOUNTER — Inpatient Hospital Stay (HOSPITAL_COMMUNITY)

## 2024-03-03 DIAGNOSIS — Z885 Allergy status to narcotic agent status: Secondary | ICD-10-CM

## 2024-03-03 DIAGNOSIS — I252 Old myocardial infarction: Secondary | ICD-10-CM

## 2024-03-03 DIAGNOSIS — D631 Anemia in chronic kidney disease: Secondary | ICD-10-CM | POA: Diagnosis present

## 2024-03-03 DIAGNOSIS — E785 Hyperlipidemia, unspecified: Secondary | ICD-10-CM | POA: Diagnosis present

## 2024-03-03 DIAGNOSIS — E1140 Type 2 diabetes mellitus with diabetic neuropathy, unspecified: Secondary | ICD-10-CM | POA: Diagnosis present

## 2024-03-03 DIAGNOSIS — G4733 Obstructive sleep apnea (adult) (pediatric): Secondary | ICD-10-CM | POA: Diagnosis present

## 2024-03-03 DIAGNOSIS — J810 Acute pulmonary edema: Secondary | ICD-10-CM | POA: Diagnosis present

## 2024-03-03 DIAGNOSIS — Z7982 Long term (current) use of aspirin: Secondary | ICD-10-CM

## 2024-03-03 DIAGNOSIS — Z6841 Body Mass Index (BMI) 40.0 and over, adult: Secondary | ICD-10-CM | POA: Diagnosis not present

## 2024-03-03 DIAGNOSIS — Z992 Dependence on renal dialysis: Secondary | ICD-10-CM | POA: Diagnosis not present

## 2024-03-03 DIAGNOSIS — Z789 Other specified health status: Secondary | ICD-10-CM

## 2024-03-03 DIAGNOSIS — I2510 Atherosclerotic heart disease of native coronary artery without angina pectoris: Secondary | ICD-10-CM | POA: Diagnosis present

## 2024-03-03 DIAGNOSIS — J9601 Acute respiratory failure with hypoxia: Secondary | ICD-10-CM | POA: Diagnosis present

## 2024-03-03 DIAGNOSIS — Z833 Family history of diabetes mellitus: Secondary | ICD-10-CM | POA: Diagnosis not present

## 2024-03-03 DIAGNOSIS — Z800 Family history of malignant neoplasm of digestive organs: Secondary | ICD-10-CM

## 2024-03-03 DIAGNOSIS — I350 Nonrheumatic aortic (valve) stenosis: Secondary | ICD-10-CM | POA: Diagnosis present

## 2024-03-03 DIAGNOSIS — I9589 Other hypotension: Secondary | ICD-10-CM | POA: Diagnosis present

## 2024-03-03 DIAGNOSIS — K219 Gastro-esophageal reflux disease without esophagitis: Secondary | ICD-10-CM | POA: Diagnosis present

## 2024-03-03 DIAGNOSIS — Z79899 Other long term (current) drug therapy: Secondary | ICD-10-CM

## 2024-03-03 DIAGNOSIS — G8929 Other chronic pain: Secondary | ICD-10-CM | POA: Diagnosis present

## 2024-03-03 DIAGNOSIS — Z91158 Patient's noncompliance with renal dialysis for other reason: Secondary | ICD-10-CM | POA: Diagnosis not present

## 2024-03-03 DIAGNOSIS — Z531 Procedure and treatment not carried out because of patient's decision for reasons of belief and group pressure: Secondary | ICD-10-CM | POA: Diagnosis present

## 2024-03-03 DIAGNOSIS — Z91013 Allergy to seafood: Secondary | ICD-10-CM

## 2024-03-03 DIAGNOSIS — E1122 Type 2 diabetes mellitus with diabetic chronic kidney disease: Secondary | ICD-10-CM | POA: Diagnosis present

## 2024-03-03 DIAGNOSIS — I5043 Acute on chronic combined systolic (congestive) and diastolic (congestive) heart failure: Secondary | ICD-10-CM | POA: Diagnosis present

## 2024-03-03 DIAGNOSIS — E875 Hyperkalemia: Secondary | ICD-10-CM | POA: Diagnosis present

## 2024-03-03 DIAGNOSIS — I132 Hypertensive heart and chronic kidney disease with heart failure and with stage 5 chronic kidney disease, or end stage renal disease: Secondary | ICD-10-CM | POA: Diagnosis present

## 2024-03-03 DIAGNOSIS — E669 Obesity, unspecified: Secondary | ICD-10-CM | POA: Diagnosis present

## 2024-03-03 DIAGNOSIS — J4489 Other specified chronic obstructive pulmonary disease: Secondary | ICD-10-CM | POA: Diagnosis present

## 2024-03-03 DIAGNOSIS — Z955 Presence of coronary angioplasty implant and graft: Secondary | ICD-10-CM | POA: Diagnosis not present

## 2024-03-03 DIAGNOSIS — E8779 Other fluid overload: Secondary | ICD-10-CM | POA: Diagnosis present

## 2024-03-03 DIAGNOSIS — Z8619 Personal history of other infectious and parasitic diseases: Secondary | ICD-10-CM

## 2024-03-03 DIAGNOSIS — Z8249 Family history of ischemic heart disease and other diseases of the circulatory system: Secondary | ICD-10-CM

## 2024-03-03 DIAGNOSIS — N186 End stage renal disease: Secondary | ICD-10-CM | POA: Diagnosis present

## 2024-03-03 DIAGNOSIS — I5030 Unspecified diastolic (congestive) heart failure: Secondary | ICD-10-CM | POA: Diagnosis not present

## 2024-03-03 LAB — BASIC METABOLIC PANEL WITH GFR
Anion gap: 18 — ABNORMAL HIGH (ref 5–15)
BUN: 53 mg/dL — ABNORMAL HIGH (ref 8–23)
CO2: 22 mmol/L (ref 22–32)
Calcium: 8.6 mg/dL — ABNORMAL LOW (ref 8.9–10.3)
Chloride: 101 mmol/L (ref 98–111)
Creatinine, Ser: 9.26 mg/dL — ABNORMAL HIGH (ref 0.44–1.00)
GFR, Estimated: 4 mL/min — ABNORMAL LOW (ref >60)
Glucose, Bld: 232 mg/dL — ABNORMAL HIGH (ref 70–99)
Potassium: 5.7 mmol/L — ABNORMAL HIGH (ref 3.5–5.1)
Sodium: 141 mmol/L (ref 135–145)

## 2024-03-03 LAB — HEPATITIS B SURFACE ANTIGEN: Hepatitis B Surface Ag: NONREACTIVE

## 2024-03-03 LAB — I-STAT CHEM 8, ED
BUN: 50 mg/dL — ABNORMAL HIGH (ref 8–23)
Calcium, Ion: 0.98 mmol/L — ABNORMAL LOW (ref 1.15–1.40)
Chloride: 103 mmol/L (ref 98–111)
Creatinine, Ser: 9.5 mg/dL — ABNORMAL HIGH (ref 0.44–1.00)
Glucose, Bld: 237 mg/dL — ABNORMAL HIGH (ref 70–99)
HCT: 33 % — ABNORMAL LOW (ref 36.0–46.0)
Hemoglobin: 11.2 g/dL — ABNORMAL LOW (ref 12.0–15.0)
Potassium: 5.5 mmol/L — ABNORMAL HIGH (ref 3.5–5.1)
Sodium: 136 mmol/L (ref 135–145)
TCO2: 21 mmol/L — ABNORMAL LOW (ref 22–32)

## 2024-03-03 LAB — I-STAT ARTERIAL BLOOD GAS, ED
Acid-Base Excess: 1 mmol/L (ref 0.0–2.0)
Bicarbonate: 26.3 mmol/L (ref 20.0–28.0)
Calcium, Ion: 1.1 mmol/L — ABNORMAL LOW (ref 1.15–1.40)
HCT: 25 % — ABNORMAL LOW (ref 36.0–46.0)
Hemoglobin: 8.5 g/dL — ABNORMAL LOW (ref 12.0–15.0)
O2 Saturation: 100 %
Patient temperature: 97.6
Potassium: 4.8 mmol/L (ref 3.5–5.1)
Sodium: 136 mmol/L (ref 135–145)
TCO2: 28 mmol/L (ref 22–32)
pCO2 arterial: 44.3 mmHg (ref 32–48)
pH, Arterial: 7.379 (ref 7.35–7.45)
pO2, Arterial: 202 mmHg — ABNORMAL HIGH (ref 83–108)

## 2024-03-03 LAB — GLUCOSE, CAPILLARY
Glucose-Capillary: 199 mg/dL — ABNORMAL HIGH (ref 70–99)
Glucose-Capillary: 166 mg/dL — ABNORMAL HIGH (ref 70–99)

## 2024-03-03 LAB — CBC WITH DIFFERENTIAL/PLATELET
Basophils Absolute: 0 K/uL (ref 0.0–0.1)
Basophils Relative: 0 %
Eosinophils Absolute: 0 K/uL (ref 0.0–0.5)
Eosinophils Relative: 0 %
HCT: 26 % — ABNORMAL LOW (ref 36.0–46.0)
Hemoglobin: 8.1 g/dL — ABNORMAL LOW (ref 12.0–15.0)
Lymphocytes Relative: 7 %
Lymphs Abs: 0.7 K/uL (ref 0.7–4.0)
MCH: 26.7 pg (ref 26.0–34.0)
MCHC: 31.2 g/dL (ref 30.0–36.0)
MCV: 85.8 fL (ref 80.0–100.0)
Monocytes Absolute: 0.4 K/uL (ref 0.1–1.0)
Monocytes Relative: 4 %
Neutro Abs: 8.7 K/uL — ABNORMAL HIGH (ref 1.7–7.7)
Neutrophils Relative %: 89 %
Platelets: 92 K/uL — ABNORMAL LOW (ref 150–400)
RBC: 3.03 MIL/uL — ABNORMAL LOW (ref 3.87–5.11)
RDW: 19.3 % — ABNORMAL HIGH (ref 11.5–15.5)
WBC: 9.8 K/uL (ref 4.0–10.5)
nRBC: 0 % (ref 0.0–0.2)

## 2024-03-03 LAB — I-STAT VENOUS BLOOD GAS, ED
Acid-Base Excess: 1 mmol/L (ref 0.0–2.0)
Bicarbonate: 24.8 mmol/L (ref 20.0–28.0)
Calcium, Ion: 0.95 mmol/L — ABNORMAL LOW (ref 1.15–1.40)
HCT: 25 % — ABNORMAL LOW (ref 36.0–46.0)
Hemoglobin: 8.5 g/dL — ABNORMAL LOW (ref 12.0–15.0)
O2 Saturation: 85 %
Potassium: 5.3 mmol/L — ABNORMAL HIGH (ref 3.5–5.1)
Sodium: 136 mmol/L (ref 135–145)
TCO2: 26 mmol/L (ref 22–32)
pCO2, Ven: 36 mmHg — ABNORMAL LOW (ref 44–60)
pH, Ven: 7.447 — ABNORMAL HIGH (ref 7.25–7.43)
pO2, Ven: 48 mmHg — ABNORMAL HIGH (ref 32–45)

## 2024-03-03 LAB — CBC
HCT: 26.8 % — ABNORMAL LOW (ref 36.0–46.0)
Hemoglobin: 8.3 g/dL — ABNORMAL LOW (ref 12.0–15.0)
MCH: 26.9 pg (ref 26.0–34.0)
MCHC: 31 g/dL (ref 30.0–36.0)
MCV: 87 fL (ref 80.0–100.0)
Platelets: 98 K/uL — ABNORMAL LOW (ref 150–400)
RBC: 3.08 MIL/uL — ABNORMAL LOW (ref 3.87–5.11)
RDW: 19.5 % — ABNORMAL HIGH (ref 11.5–15.5)
WBC: 9.5 K/uL (ref 4.0–10.5)
nRBC: 0 % (ref 0.0–0.2)

## 2024-03-03 LAB — CREATININE, SERUM
Creatinine, Ser: 9.61 mg/dL — ABNORMAL HIGH (ref 0.44–1.00)
GFR, Estimated: 4 mL/min — ABNORMAL LOW (ref >60)

## 2024-03-03 LAB — HIV ANTIBODY (ROUTINE TESTING W REFLEX): HIV Screen 4th Generation wRfx: NONREACTIVE

## 2024-03-03 LAB — TROPONIN I (HIGH SENSITIVITY): Troponin I (High Sensitivity): 38 ng/L — ABNORMAL HIGH (ref <18)

## 2024-03-03 LAB — HEMOGLOBIN A1C
Hgb A1c MFr Bld: 7 % — ABNORMAL HIGH (ref 4.8–5.6)
Mean Plasma Glucose: 154.2 mg/dL

## 2024-03-03 LAB — BRAIN NATRIURETIC PEPTIDE: B Natriuretic Peptide: 400.3 pg/mL — ABNORMAL HIGH (ref 0.0–100.0)

## 2024-03-03 MED ORDER — ACETAMINOPHEN 325 MG PO TABS
650.0000 mg | ORAL_TABLET | Freq: Four times a day (QID) | ORAL | Status: DC | PRN
  Administered 2024-03-03: 650 mg via ORAL
  Filled 2024-03-03: qty 2

## 2024-03-03 MED ORDER — ANTICOAGULANT SODIUM CITRATE 4% (200MG/5ML) IV SOLN
5.0000 mL | Status: DC | PRN
Start: 2024-03-03 — End: 2024-03-03

## 2024-03-03 MED ORDER — MIDODRINE HCL 5 MG PO TABS
ORAL_TABLET | ORAL | Status: AC
  Filled 2024-03-03: qty 2

## 2024-03-03 MED ORDER — CHLORHEXIDINE GLUCONATE CLOTH 2 % EX PADS
6.0000 | MEDICATED_PAD | Freq: Every day | CUTANEOUS | Status: DC
  Administered 2024-03-03: 6 via TOPICAL

## 2024-03-03 MED ORDER — ALTEPLASE 2 MG IJ SOLR: 2.0000 mg | Freq: Once | INTRAMUSCULAR | Status: DC | PRN

## 2024-03-03 MED ORDER — HEPARIN SODIUM (PORCINE) 1000 UNIT/ML DIALYSIS: 1000.0000 [IU] | INTRAMUSCULAR | Status: DC | PRN

## 2024-03-03 MED ORDER — LIDOCAINE HCL (PF) 1 % IJ SOLN: 5.0000 mL | INTRAMUSCULAR | Status: DC | PRN

## 2024-03-03 MED ORDER — HEPARIN SODIUM (PORCINE) 5000 UNIT/ML IJ SOLN
5000.0000 [IU] | Freq: Three times a day (TID) | INTRAMUSCULAR | Status: DC
  Administered 2024-03-03 – 2024-03-08 (×11): 5000 [IU] via SUBCUTANEOUS
  Filled 2024-03-03 (×14): qty 1

## 2024-03-03 MED ORDER — OXYCODONE HCL 5 MG PO TABS
10.0000 mg | ORAL_TABLET | Freq: Four times a day (QID) | ORAL | Status: DC | PRN
  Administered 2024-03-07: 10 mg via ORAL

## 2024-03-03 MED ORDER — PENTAFLUOROPROP-TETRAFLUOROETH EX AERO: 1.0000 | INHALATION_SPRAY | CUTANEOUS | Status: DC | PRN

## 2024-03-03 MED ORDER — ATORVASTATIN CALCIUM 80 MG PO TABS
80.0000 mg | ORAL_TABLET | Freq: Every day | ORAL | Status: DC
  Administered 2024-03-04 – 2024-03-08 (×5): 80 mg via ORAL
  Filled 2024-03-03 (×5): qty 1

## 2024-03-03 MED ORDER — NEPRO/CARBSTEADY PO LIQD
237.0000 mL | ORAL | Status: DC | PRN
Start: 2024-03-03 — End: 2024-03-03

## 2024-03-03 MED ORDER — HEPARIN SODIUM (PORCINE) 1000 UNIT/ML IJ SOLN
INTRAMUSCULAR | Status: AC
  Filled 2024-03-03: qty 3

## 2024-03-03 MED ORDER — TORSEMIDE 20 MG PO TABS
20.0000 mg | ORAL_TABLET | Freq: Once | ORAL | Status: AC
  Administered 2024-03-03: 20 mg via ORAL
  Filled 2024-03-03: qty 1

## 2024-03-03 MED ORDER — MIDODRINE HCL 5 MG PO TABS
10.0000 mg | ORAL_TABLET | ORAL | Status: DC
  Administered 2024-03-07: 10 mg via ORAL
  Filled 2024-03-03: qty 2

## 2024-03-03 MED ORDER — ASPIRIN 81 MG PO TBEC
81.0000 mg | DELAYED_RELEASE_TABLET | Freq: Every day | ORAL | Status: DC
  Administered 2024-03-04 – 2024-03-08 (×5): 81 mg via ORAL
  Filled 2024-03-03 (×5): qty 1

## 2024-03-03 MED ORDER — HEPARIN SODIUM (PORCINE) 1000 UNIT/ML DIALYSIS
2500.0000 [IU] | Freq: Once | INTRAMUSCULAR | Status: AC
  Administered 2024-03-03: 2500 [IU] via INTRAVENOUS_CENTRAL

## 2024-03-03 MED ORDER — LIDOCAINE-PRILOCAINE 2.5-2.5 % EX CREA: 1.0000 | TOPICAL_CREAM | CUTANEOUS | Status: DC | PRN

## 2024-03-03 MED ORDER — HEPARIN SODIUM (PORCINE) 1000 UNIT/ML DIALYSIS
1000.0000 [IU] | INTRAMUSCULAR | Status: AC | PRN
  Administered 2024-03-03: 1000 [IU] via INTRAVENOUS_CENTRAL

## 2024-03-03 MED ORDER — MIDODRINE HCL 5 MG PO TABS
10.0000 mg | ORAL_TABLET | Freq: Once | ORAL | Status: AC
  Administered 2024-03-03: 10 mg via ORAL

## 2024-03-03 MED ORDER — NITROGLYCERIN IN D5W 200-5 MCG/ML-% IV SOLN
0.0000 ug/min | INTRAVENOUS | Status: DC
  Administered 2024-03-03: 5 ug/min via INTRAVENOUS
  Filled 2024-03-03: qty 250

## 2024-03-03 MED ORDER — FLUTICASONE FUROATE-VILANTEROL 100-25 MCG/ACT IN AEPB
1.0000 | INHALATION_SPRAY | Freq: Every day | RESPIRATORY_TRACT | Status: DC
  Administered 2024-03-04 – 2024-03-05 (×2): 1 via RESPIRATORY_TRACT
  Filled 2024-03-03: qty 28

## 2024-03-03 MED ORDER — IPRATROPIUM-ALBUTEROL 0.5-2.5 (3) MG/3ML IN SOLN
3.0000 mL | RESPIRATORY_TRACT | Status: DC | PRN
  Administered 2024-03-04: 3 mL via RESPIRATORY_TRACT
  Filled 2024-03-03: qty 3

## 2024-03-03 NOTE — Procedures (Signed)
 S: seen in [facility], no new c/o's  Vitals:   03/03/24 1244 03/03/24 1343 03/03/24 1455 03/03/24 1458  BP: (!) 151/97 (!) 142/61 129/71 (!) 141/53  Pulse:  96 89 89  Resp:  18 18   Temp:   98.7 F (37.1 C)   TempSrc:   Oral   SpO2:  95% 100% 99%  Weight:   114.6 kg   Height:        Recent Labs  Lab 03/03/24 0916 03/03/24 1015 03/03/24 1140 03/03/24 1336  HGB  --    < > 8.5* 8.3*  CALCIUM  8.6*  --   --   --   CREATININE 9.26*  --   --  9.61*  K 5.7*  --  5.3*  --    < > = values in this interval not displayed.    Inpatient medications:  aspirin  EC  81 mg Oral Daily   atorvastatin   80 mg Oral Daily   Chlorhexidine  Gluconate Cloth  6 each Topical Q0600   fluticasone  furoate-vilanterol  1 puff Inhalation Daily   heparin   2,500 Units Dialysis Once in dialysis   heparin   5,000 Units Subcutaneous Q8H   [START ON 03/04/2024] midodrine   10 mg Oral Q T,Th,Sat-1800   midodrine   10 mg Oral Once in dialysis    anticoagulant sodium citrate      nitroGLYCERIN  5 mcg/min (03/03/24 0908)   alteplase , anticoagulant sodium citrate , feeding supplement (NEPRO CARB STEADY), heparin , heparin , ipratropium-albuterol , lidocaine  (PF), lidocaine -prilocaine , oxyCODONE , pentafluoroprop-tetrafluoroeth  I was present at the procedure, reviewed the HD regimen and made appropriate changes.   Myer Fret MD  [facility] 03/03/2024, 3:07 PM

## 2024-03-03 NOTE — ED Notes (Addendum)
 Bipap removed by RT, [place] applied at 4L, spO2 @ 95

## 2024-03-03 NOTE — Progress Notes (Signed)
 Came bedside for echo, but patient is in HD at this time.

## 2024-03-03 NOTE — Hospital Course (Addendum)
 [name] is a 68 y.o.female with history of ESRD on dialysis, CAD s/p stenting, OSA, HTN, HLD, T2DM, GERD, HFpEF, COPD, asthma who was admitted to the [facility] Medicine Teaching Service at [facility] for acute hypoxic respiratory failure likely 2/2 volume overload from missed dialysis and stopping torsemide . Her hospital course is outlined below:  Acute hypoxic respiratory failure Patient presented with SOB, chest tightness, orthopnea found to be AHRF 2/2 volume overload from missed dialysis and stopping torsemide . CPAP started with EMS and transitioned to BiPAP on arrival to ED, quickly weaned to [facility]. Work-up included CXR which showed pulmonary edema.  Echo showed EF 65 to 70%. Nephrology was consulted for dialysis. Patient was placed on a nitroglycerin  drip in the ED, suspected for pulmonary edema, which was stopped on the floor. Duonebs were administered as needed. Patient originally requiring oxygen  during this admission, however was able to be weaned to ***  Other chronic conditions were medically managed with home medications and formulary alternatives as necessary (CAD, HTN, HLD, T2DM,HFpEF, chronic pain, neuropathy, asthma)  Follow-up recommendations: 1.  Establish outpatient hemodialysis

## 2024-03-03 NOTE — ED Provider Notes (Signed)
 [place] EMERGENCY DEPARTMENT AT [facility] Provider Note   CSN: [id number] Arrival date & time: 03/03/24  9174     Patient presents with: Respiratory Distress and Chest Pain   [name] is a 68 y.o. female.   68 yo F with a cc of sob.  Going on for the past couple days.  Patient has a history of CHF and COPD.  She thinks it feels like she has too much fluid on her.  Unable to lay back flat.  She missed dialysis yesterday due to traveling.  She has some chest pressure as well.  Denies fevers.  Per EMS the patient had an oxygen  saturation in the 50s.  Started on CPAP with some improvement but had some downward trending of her oxygen  saturation and was bagged en route.   Chest Pain      Prior to Admission medications   Medication Sig Start Date End Date Taking? Authorizing Provider  albuterol  (VENTOLIN  HFA) 108 (90 Base) MCG/ACT inhaler Inhale 2 puffs into the lungs every 4 (four) hours as needed for wheezing or shortness of breath. 06/28/20   Vivienne Delon CHRISTELLA, PA-C  aspirin  81 MG EC tablet Take 1 tablet (81 mg total) by mouth daily. 09/20/20   Singh, Prashant K, MD  atorvastatin  (LIPITOR ) 80 MG tablet Take 1 tablet (80 mg total) by mouth every evening. Patient taking differently: Take 80 mg by mouth daily. 04/24/21   Bacigalupo, Angela M, MD  carvedilol  (COREG ) 12.5 MG tablet Take 1 tablet (12.5 mg total) by mouth 2 (two) times daily with a meal. 09/19/21   Paige, Richerd PARAS, DO  Continuous Blood Gluc Receiver (FREESTYLE LIBRE 14 DAY READER) DEVI To check blood sugar ACHS 09/27/20   Vivienne Delon CHRISTELLA, PA-C  Continuous Blood Gluc Sensor (FREESTYLE LIBRE 14 DAY SENSOR) MISC To check blood sugar ACHS; change every 14 days 09/27/20   Vivienne Delon CHRISTELLA, PA-C  fluticasone  (FLONASE ) 50 MCG/ACT nasal spray Place 2 sprays into both nostrils daily as needed for allergies or rhinitis. 09/28/17   Vivienne Delon CHRISTELLA, PA-C  fluticasone  furoate-vilanterol (BREO ELLIPTA )  200-25 MCG/INH AEPB Inhale 1 puff into the lungs daily. 08/30/20   Vivienne Delon CHRISTELLA, PA-C  Glucose Blood (BLOOD GLUCOSE TEST STRIPS 333) STRP Enough test stripes to test blood sugar TID x 30 days. No refills 04/16/22   Trudy Anthony CHRISTELLA, MD  HUMULIN  70/30 KWIKPEN (70-30) 100 UNIT/ML KwikPen Inject 30 Units into the skin 2 (two) times daily. 05/26/22   [provider]  hydrOXYzine  (ATARAX ) 25 MG tablet Take 1 tablet (25 mg total) by mouth 2 (two) times daily. 11/17/22   Hongalgi, Anand D, MD  Insulin  Pen Needle 32G X 4 MM MISC Enough pen needles for 100 units of novolin  QAM x 30 days. No refills 04/16/22   Trudy Anthony CHRISTELLA, MD  LEVEMIR  FLEXPEN 100 UNIT/ML FlexPen Inject 25 Units into the skin daily. 11/02/22   Harden Jerona GAILS, MD  lidocaine  (LIDODERM ) 5 % Place 1 patch onto the skin daily. Remove & Discard patch within 12 hours or as directed by MD 06/25/23   Nettie, April, MD  methocarbamol  (ROBAXIN ) 500 MG tablet Take 1 tablet (500 mg total) by mouth 2 (two) times daily. 11/02/22   Harden Jerona GAILS, MD  midodrine  (PROAMATINE ) 10 MG tablet Take 10 mg by mouth See admin instructions. Take 10 mg by mouth 30 minutes before dialysis on Tues/Thurs/Sat 12/23/21   [provider]  naloxone  (NARCAN ) nasal spray  4 mg/0.1 mL Place 1 spray into the nose as directed. 04/08/22   [provider]  nitroGLYCERIN  (NITRODUR - DOSED IN MG/24 HR) 0.2 mg/hr patch Place 1 patch (0.2 mg total) onto the skin daily. Apply a patch to the dorsum of the left ankle.  Change location daily. 11/17/22   Hongalgi, Anand D, MD  nitroGLYCERIN  (NITRODUR - DOSED IN MG/24 HR) 0.2 mg/hr patch Place 1 patch (0.2 mg total) onto the skin daily. Apply 1 patch to the dorsum of the ankle and change location daily.  Wash ankle and foot with soap and water daily dry before applying patch. 12/24/22 12/24/23  Harden Jerona GAILS, MD  nitroGLYCERIN  (NITROSTAT ) 0.4 MG SL tablet Place 1 tablet (0.4 mg total) under the tongue every 5 (five)  minutes x 3 doses as needed for chest pain. 05/03/20   Vivienne Delon HERO, PA-C  Oxycodone  HCl 10 MG TABS Take 10 mg by mouth 4 (four) times daily. 04/01/22   [provider]  oxyCODONE -acetaminophen  (PERCOCET) 10-325 MG tablet Take 1 tablet by mouth every 6 (six) hours as needed for pain. 04/29/23   Laurice Maude BROCKS, MD  pantoprazole  (PROTONIX ) 40 MG tablet Take 40 mg by mouth daily before breakfast.    [provider]  pregabalin  (LYRICA ) 100 MG capsule TAKE 1 CAPSULE BY MOUTH TWICE A DAY 07/13/23   Harden Jerona GAILS, MD  torsemide  (DEMADEX ) 10 MG tablet Take 10 mg by mouth See admin instructions. Take 10 mg by mouth every other morning    [provider]  valACYclovir  (VALTREX ) 500 MG tablet TAKE (1) TABLET BY MOUTH EVERY OTHER DAY. Patient taking differently: Take 500 mg by mouth daily. 01/24/21   Bacigalupo, Angela M, MD  VELPHORO  500 MG chewable tablet Chew 1,500 mg by mouth 3 (three) times daily. 09/11/21   [provider]  gabapentin  (NEURONTIN ) 100 MG capsule Take 1 capsule (100 mg total) by mouth 3 (three) times daily. 08/19/17 02/03/19  Patel, Sona, MD    Allergies: Shellfish allergy, Diazepam , and Morphine     Review of Systems  Cardiovascular:  Positive for chest pain.    Updated Vital Signs BP (!) 141/53   Pulse 89   Temp 98.7 F (37.1 C) (Oral)   Resp 18   Ht 5' 5 (1.651 m)   Wt 114.6 kg   SpO2 99%   BMI 42.04 kg/m   Physical Exam Vitals and nursing note reviewed.  Constitutional:      General: She is not in acute distress.    Appearance: She is well-developed. She is not diaphoretic.  HENT:     Head: Normocephalic and atraumatic.  Eyes:     Pupils: Pupils are equal, round, and reactive to light.  Cardiovascular:     Rate and Rhythm: Regular rhythm. Tachycardia present.     Heart sounds: No murmur heard.    No friction rub. No gallop.  Pulmonary:     Effort: Tachypnea present.     Breath sounds: No wheezing or rales.      Comments: Diffuse Rales up to the clavicles bilaterally Abdominal:     General: There is no distension.     Palpations: Abdomen is soft.     Tenderness: There is no abdominal tenderness.  Musculoskeletal:        General: No tenderness.     Cervical back: Normal range of motion and neck supple.     Comments: Left AV fistula with palpable thrill  Skin:    General:  Skin is warm and dry.  Neurological:     Mental Status: She is alert and oriented to person, place, and time.  Psychiatric:        Behavior: Behavior normal.     (all labs ordered are listed, but only abnormal results are displayed) Labs Reviewed  BASIC METABOLIC PANEL WITH GFR - Abnormal; Notable for the following components:      Result Value   Potassium 5.7 (*)    Glucose, Bld 232 (*)    BUN 53 (*)    Creatinine, Ser 9.26 (*)    Calcium  8.6 (*)    GFR, Estimated 4 (*)    Anion gap 18 (*)    All other components within normal limits  CBC WITH DIFFERENTIAL/PLATELET - Abnormal; Notable for the following components:   RBC 3.03 (*)    Hemoglobin 8.1 (*)    HCT 26.0 (*)    RDW 19.3 (*)    Platelets 92 (*)    Neutro Abs 8.7 (*)    All other components within normal limits  BRAIN NATRIURETIC PEPTIDE - Abnormal; Notable for the following components:   B Natriuretic Peptide 400.3 (*)    All other components within normal limits  CBC - Abnormal; Notable for the following components:   RBC 3.08 (*)    Hemoglobin 8.3 (*)    HCT 26.8 (*)    RDW 19.5 (*)    Platelets 98 (*)    All other components within normal limits  CREATININE, SERUM - Abnormal; Notable for the following components:   Creatinine, Ser 9.61 (*)    GFR, Estimated 4 (*)    All other components within normal limits  HEMOGLOBIN A1C - Abnormal; Notable for the following components:   Hgb A1c MFr Bld 7.0 (*)    All other components within normal limits  GLUCOSE, CAPILLARY - Abnormal; Notable for the following components:   Glucose-Capillary 199 (*)     All other components within normal limits  I-STAT CHEM 8, ED - Abnormal; Notable for the following components:   Potassium 5.5 (*)    BUN 50 (*)    Creatinine, Ser 9.50 (*)    Glucose, Bld 237 (*)    Calcium , Ion 0.98 (*)    TCO2 21 (*)    Hemoglobin 11.2 (*)    HCT 33.0 (*)    All other components within normal limits  I-STAT VENOUS BLOOD GAS, ED - Abnormal; Notable for the following components:   pH, Ven 7.447 (*)    pCO2, Ven 36.0 (*)    pO2, Ven 48 (*)    Potassium 5.3 (*)    Calcium , Ion 0.95 (*)    HCT 25.0 (*)    Hemoglobin 8.5 (*)    All other components within normal limits  I-STAT ARTERIAL BLOOD GAS, ED - Abnormal; Notable for the following components:   pO2, Arterial 202 (*)    Calcium , Ion 1.10 (*)    HCT 25.0 (*)    Hemoglobin 8.5 (*)    All other components within normal limits  TROPONIN I (HIGH SENSITIVITY) - Abnormal; Notable for the following components:   Troponin I (High Sensitivity) 38 (*)    All other components within normal limits  HEPATITIS B SURFACE ANTIGEN  CBC WITH DIFFERENTIAL/PLATELET  BLOOD GAS, ARTERIAL  HIV ANTIBODY (ROUTINE TESTING W REFLEX)  HEPATITIS B SURFACE ANTIBODY, QUANTITATIVE    EKG: None  Radiology: [facility] Chest Port 1 View Result Date: 03/03/2024 CLINICAL DATA:  Shortness of  breath. Respiratory distress. Chest pain. EXAM: PORTABLE CHEST 1 VIEW COMPARISON:  10/20/2022 FINDINGS: Heart size upper limits of normal. Newly seen interstitial and alveolar density consistent with pulmonary edema. Superimposed focal density in the perihilar regions and lower lobes right more than left could possibly indicate associated pneumonia. Congestive heart failure is favored primarily. IMPRESSION: Newly seen interstitial and alveolar density consistent with pulmonary edema. Superimposed focal density in the perihilar regions and lower lobes right more than left could possibly indicate associated pneumonia. Congestive heart failure is favored primarily.  Electronically Signed   By: Oneil Officer M.D.   On: 03/03/2024 09:25     .Critical Care  Performed by: Emil Share, DO Authorized by: Emil Share, DO   Critical care provider statement:    Critical care time (minutes):  35   Critical care time was exclusive of:  Separately billable procedures and treating other patients   Critical care was time spent personally by me on the following activities:  Development of treatment plan with patient or surrogate, discussions with consultants, evaluation of patient's response to treatment, examination of patient, ordering and review of laboratory studies, ordering and review of radiographic studies, ordering and performing treatments and interventions, pulse oximetry, re-evaluation of patient's condition and review of old charts   Care discussed with: admitting provider      Medications Ordered in the ED  nitroGLYCERIN  50 mg in dextrose  5 % 250 mL (0.2 mg/mL) infusion (5 mcg/min Intravenous New Bag/Given 03/03/24 0908)  Chlorhexidine  Gluconate Cloth 2 % PADS 6 each (has no administration in time range)  pentafluoroprop-tetrafluoroeth (GEBAUERS) aerosol 1 Application (has no administration in time range)  lidocaine  (PF) (XYLOCAINE ) 1 % injection 5 mL (has no administration in time range)  lidocaine -prilocaine  (EMLA ) cream 1 Application (has no administration in time range)  feeding supplement (NEPRO CARB STEADY) liquid 237 mL (has no administration in time range)  heparin  injection 1,000 Units (has no administration in time range)  anticoagulant sodium citrate  solution 5 mL (has no administration in time range)  alteplase  (CATHFLO ACTIVASE ) injection 2 mg (has no administration in time range)  heparin  injection 1,000 Units (has no administration in time range)  midodrine  (PROAMATINE ) tablet 10 mg (has no administration in time range)  midodrine  (PROAMATINE ) tablet 10 mg (has no administration in time range)  heparin  injection 5,000 Units (has no  administration in time range)  atorvastatin  (LIPITOR ) tablet 80 mg (has no administration in time range)  aspirin  EC tablet 81 mg (has no administration in time range)  oxyCODONE  (Oxy IR/ROXICODONE ) immediate release tablet 10 mg (has no administration in time range)  fluticasone  furoate-vilanterol (BREO ELLIPTA ) 100-25 MCG/ACT 1 puff (1 puff Inhalation Not Given 03/03/24 1351)  ipratropium-albuterol  (DUONEB) 0.5-2.5 (3) MG/3ML nebulizer solution 3 mL (has no administration in time range)  heparin  injection 2,500 Units (2,500 Units Dialysis Given 03/03/24 1509)                                    Medical Decision Making Amount and/or Complexity of Data Reviewed Labs: ordered. Radiology: ordered. ECG/medicine tests: ordered.  Risk Prescription drug management. Decision regarding hospitalization.   68 yo F with a chief complaint of difficulty breathing.  Going on for couple days.  Orthopnea PND.  She has a history of fluid overload in the past.  Missed dialysis yesterday.  She thinks it feels similar.  Patient was being bagged by EMS.  Transition  to BiPAP here.  Difficulty obtaining pulse ox, will obtain an ABG.  ABG with good oxygenation.  Chest x-Shuey blood work.  Nitro infusion.  Chest x-Leach independently interpreted by me with fluid overload.  I discussed this with Dr. Susannah, nephrology will evaluate the patient.  Patient difficult stick multiple IV attempts.  Unable to afford to get a CBC.  H&H unremarkable on i-STAT.  Does not clinically appear significantly anemic.  Troponin mildly elevated.  Potassium mildly elevated without concerning EKG changes.  Will discuss with medicine for admission.   The patients results and plan were reviewed and discussed.   Any x-rays performed were independently reviewed by myself.   Differential diagnosis were considered with the presenting HPI.  Medications  nitroGLYCERIN  50 mg in dextrose  5 % 250 mL (0.2 mg/mL) infusion (5 mcg/min  Intravenous New Bag/Given 03/03/24 0908)  Chlorhexidine  Gluconate Cloth 2 % PADS 6 each (has no administration in time range)  pentafluoroprop-tetrafluoroeth (GEBAUERS) aerosol 1 Application (has no administration in time range)  lidocaine  (PF) (XYLOCAINE ) 1 % injection 5 mL (has no administration in time range)  lidocaine -prilocaine  (EMLA ) cream 1 Application (has no administration in time range)  feeding supplement (NEPRO CARB STEADY) liquid 237 mL (has no administration in time range)  heparin  injection 1,000 Units (has no administration in time range)  anticoagulant sodium citrate  solution 5 mL (has no administration in time range)  alteplase  (CATHFLO ACTIVASE ) injection 2 mg (has no administration in time range)  heparin  injection 1,000 Units (has no administration in time range)  midodrine  (PROAMATINE ) tablet 10 mg (has no administration in time range)  midodrine  (PROAMATINE ) tablet 10 mg (has no administration in time range)  heparin  injection 5,000 Units (has no administration in time range)  atorvastatin  (LIPITOR ) tablet 80 mg (has no administration in time range)  aspirin  EC tablet 81 mg (has no administration in time range)  oxyCODONE  (Oxy IR/ROXICODONE ) immediate release tablet 10 mg (has no administration in time range)  fluticasone  furoate-vilanterol (BREO ELLIPTA ) 100-25 MCG/ACT 1 puff (1 puff Inhalation Not Given 03/03/24 1351)  ipratropium-albuterol  (DUONEB) 0.5-2.5 (3) MG/3ML nebulizer solution 3 mL (has no administration in time range)  heparin  injection 2,500 Units (2,500 Units Dialysis Given 03/03/24 1509)    Vitals:   03/03/24 1244 03/03/24 1343 03/03/24 1455 03/03/24 1458  BP: (!) 151/97 (!) 142/61 129/71 (!) 141/53  Pulse:  96 89 89  Resp:  18 18   Temp:   98.7 F (37.1 C)   TempSrc:   Oral   SpO2:  95% 100% 99%  Weight:   114.6 kg   Height:        Final diagnoses:  Acute pulmonary edema (HCC)    Admission/ observation were discussed with the admitting  physician, patient and/or family and they are comfortable with the plan.        Final diagnoses:  Acute pulmonary edema [facility])    ED Discharge Orders     None          Emil Share, DO 03/03/24 1515

## 2024-03-03 NOTE — ED Triage Notes (Addendum)
 [facility] from [facility] home. She is visiting from out of town. Dialysis pt Tues/Th/Sat. Missed dialysis Thursday, but possibly Tuesday and Saturday as well. Hx CHF and COPD. On arrival spO2 65-70 on RA. Cpap started with spO2 95 with duo neb. Pt became tired and EMS began bagging. EMS reported wet lung sounds throughout with severe dyspnea. Saw PCP yesterday for level of care discussions. 10 albuterol  and 1 of atrovent  given en route. Bipap applied on arrival.  EMS VS 140/90 110 HR

## 2024-03-03 NOTE — Assessment & Plan Note (Addendum)
-   ESRD on dialysis, resume dialysis - CAD s/p stenting, continue aspirin  - HTN - continue  midodrine , holding carvedilol , amlodipine  due to normal pressures. Will confirm amlodipine  use with pharmacy - HLD - continue atorvastatin  - T2DM - holding ozempic, will update A1c, monitor CBGs add insulin  if necessary - HFpEF - holding meds as above.  - Chronic pain  Neuropathy: Receives buprenorphine patch, oxycodone  10 mg every 6 hours as needed, and pregabalin  100 mg daily.  Question dosing of pregabalin  given dialysis, we will review this prior to restarting.  We will need to see when buprenorphine patch is due for removal (Sunday?).  We restarted her oxycodone , after reviewing PDMP and confirmed that she is filling this medication.

## 2024-03-03 NOTE — Progress Notes (Signed)
 Received patient in bed to unit.  Alert and oriented.  Informed consent signed and in chart.   TX duration:3 hours and 15 minutes  Patient tolerated well.  Transported back to the room  Alert, without acute distress.  Hand-off given to patient's nurse.   Access used: Left AV fistula upper arm Access issues: none  Total UF removed: 5 L Medication(s) given: none   03/03/24 1815  Vitals  Temp 99.1 F (37.3 C)  Temp Source Oral  BP 132/68  MAP (mmHg) 77  Pulse Rate 91  ECG Heart Rate 90  Resp (!) 9  Oxygen  Therapy  SpO2 100 %  O2 Device Nasal Cannula  O2 Flow Rate (L/min) 4 L/min  During Treatment Monitoring  Duration of HD Treatment -hour(s) 3.25 hour(s)  HD Safety Checks Performed Yes  Intra-Hemodialysis Comments Tx completed  Post Treatment  Dialyzer Clearance Clear  Liters Processed 78  Fluid Removed (mL) 5000 mL  Tolerated HD Treatment Yes  Post-Hemodialysis Comments tolerated well  AVG/AVF Arterial Site Held (minutes) 7 minutes  AVG/AVF Venous Site Held (minutes) 7 minutes  Fistula / Graft Left Upper arm Arteriovenous fistula  Placement Date/Time: 07/24/22 0745   Placed prior to admission: Yes  Orientation: Left  Access Location: Upper arm  Access Type: Arteriovenous fistula  Status Deaccessed     Camellia Brasil LPN [facility]

## 2024-03-03 NOTE — Assessment & Plan Note (Signed)
-   Consult nephrology, appreciate recs - Plan for hemodialysis today - Restart Torsemide  pending nephro recs - AM RFP on dialysis days

## 2024-03-03 NOTE — Assessment & Plan Note (Addendum)
 2/2 volume overload.  Currently on 4L LFNC, baseline no oxygen . Dialysis as and diureses (see below). - Admit to [facility], progressive, attending Dr. Anders - Reevaluate wheezing after dialysis, consider treatment for asthma exacerbation - Continue home Breo Ellipta , DuoNebs every 4 hours as needed - Wean O2 as tolerated - Update echocardiogram for HFpEF

## 2024-03-03 NOTE — Progress Notes (Signed)
 Patient requested to be taken off the BIPAP. Patient paced on 4L [place] and tolerating well at this time, vitals stable.

## 2024-03-03 NOTE — Progress Notes (Signed)
 IVT consult placed. Patient has 1 PIV placed by ED staff that flushes without blood return. Additional access requested with labs. Patient restricted to right arm due to fistula. Assessed cephalic and other peripheral options that are either too small or too deep. Requested that phlebotomy be contacted to obtain blood work. Secure chat sent to MD recommending alternative access should the patient require multiple IV medications.   Bryden Darden R Emelie Newsom, RN

## 2024-03-03 NOTE — Progress Notes (Signed)
 Pt arrived from ..ED..., A/ox .4.Marland Kitchenpt denies any pain, MD aware,CCMD called. CHG bath given,no further needs at this time

## 2024-03-03 NOTE — Plan of Care (Addendum)
 Patient with acute proximal respiratory failure, believed to be secondary to volume overload from missed dialysis.  Has history of HFpEF, pending repeat echocardiogram.  Not adherent with her diuretics at home.  At presentation troponin was 38, chest was notified of critical value of 374.  This was a steep rise.  Certainly demand ischemia from her hypoxic respiratory failure/HFpEF could contribute, but unable to review EKG is obtained today, I placed order for stat EKG.  I also consulted cardiology, for further assistance.  Patient is currently on a nitro drip for her pulmonary edema, plan to discuss with cardiology once EKG is obtained.  I appreciate cardiology's assistance in this patient's care.  Went to evaluate patient at bedside.  She is not having a chest pain currently.  Reported she had chest pain earlier this morning, when she cannot breathe but has since subsided.  I called and spoke with lab at 3:35 PM, they stated there was no critical troponin level called on this patient.  It appears it may have been an incorrect page.  I let the lab know that a critical was called on some patient, and that they should go back to make sure that this was paged out correctly.  I reached back out to cardiology at 3:39 PM.  Given the fact the patient is not having chest pain, and troponins are not elevated I have canceled cardiology consultation at this time.  We will review her EKG, and if concerning reach back out to cardiology.

## 2024-03-03 NOTE — Consult Note (Signed)
 Renal Service Consult Note [facility] [name], MD  Patient: Heather GOTSCHALL Date: 03/03/2024 Requesting Physician: Dr. Rolan Quale  Reason for Consult: ESRD patient with respiratory distress HPI: The patient is a 68 y.o. year-old w/ PMH as below who presented to ED this morning with shortness of breath.  On arrival her O2 sats was 70% on room air.  CPAP started per EMS with sats improving to 95% with DuoNeb.  Patient became came tired and EMS began bagging.  BiPAP was applied on arrival to ED.  BP was 140/90, HR 110.  Patient improved with BiPAP and it was removed, currently she is on [place] O2. We are asked to see for dialysis.   Pt seen in room. Pt lives in [place] and was visiting her family here. She is thinking of moving back to [place]. Main c/o is SOB, better now after bipap.    ROS - denies CP, no joint pain, no HA, no blurry vision, no rash, no diarrhea, no nausea/ vomiting   Past Medical History  Past Medical History:  Diagnosis Date   Anemia    Aortic stenosis    Echo 8/18: mean 13, peak 28, LVOT/AV mean velocity 0.51   Arthritis    Asthma    As a child    Bronchitis    CAD (coronary artery disease)    a. 09/2016: 50% Ost 1st Mrg stenosis, 50% 2nd Mrg stenosis, 20% Mid-Cx, 95% Prox LAD, 40% mid-LAD, and 10% dist-LAD stenosis. Staged PCI with DES to Prox-LAD.    Chronic combined systolic and diastolic CHF (congestive heart failure) (HCC) 2011   echo 2/18: EF 55-60, normal wall motion, grade 2 diastolic dysfunction, trivial AI // echo 3/18: Septal and apical HK, EF 45-50, normal wall motion, trivial AI, mild LAE, PASP 38 // echo 8/18: EF 60-65, normal wall motion, grade 1 diastolic dysfunction, calcified aortic valve leaflets, mild aortic stenosis (mean 13, peak 28, LVOT/AV mean velocity 0.51), mild AI, moderate MAC, mild LAE, trivial TR    Chronic kidney disease    STAGE 4   Chronic kidney disease on chronic dialysis (HCC)    t, th, sat   Complication of  anesthesia    Depression    Diabetes mellitus Dx 1989   Elevated lipids    GERD (gastroesophageal reflux disease)    Gout    Heart murmur    asymptomatic   Hepatitis C Dx 2013   Hypertension Dx 1989   Infected surgical wound    Lt arm   Myocardial infarction (HCC) 07/2015   Obesity    Pancreatitis 2013   Pneumonia    Refusal of blood transfusions as patient is Jehovah's Witness    pt states she is not [place] witness and does not refuse blood products   Tendinitis    Tremors of nervous system    LEFT HAND   Ulcer 2010   Past Surgical History  Past Surgical History:  Procedure Laterality Date   A/V FISTULAGRAM Left 04/11/2019   Procedure: A/V FISTULAGRAM;  Surgeon: Jama Cordella MATSU, MD;  Location: [facility] INVASIVE CV LAB;  Service: Cardiovascular;  Laterality: Left;   A/V FISTULAGRAM Left 06/02/2019   Procedure: A/V FISTULAGRAM;  Surgeon: Jama Cordella MATSU, MD;  Location: [facility] INVASIVE CV LAB;  Service: Cardiovascular;  Laterality: Left;   AMPUTATION Left 10/28/2022   Procedure: LEFT FOOT 1ST [place] AMPUTATION;  Surgeon: Harden Jerona GAILS, MD;  Location: [facility] OR;  Service: Orthopedics;  Laterality: Left;  APPLICATION OF WOUND VAC Left 08/14/2017   Procedure: APPLICATION OF WOUND VAC Exchange;  Surgeon: Dessa Reyes ORN, MD;  Location: [facility] ORS;  Service: General;  Laterality: Left;   APPLICATION OF WOUND VAC Left 12/21/2018   Procedure: APPLICATION OF WOUND VAC;  Surgeon: Jama Cordella MATSU, MD;  Location: [facility] ORS;  Service: Vascular;  Laterality: Left;   AV FISTULA PLACEMENT Left 08/19/2018   Procedure: ARTERIOVENOUS (AV) FISTULA CREATION ( BRACHIOBASILIC );  Surgeon: Jama Cordella MATSU, MD;  Location: [facility] ORS;  Service: Vascular;  Laterality: Left;   BASCILIC VEIN TRANSPOSITION Left 11/18/2018   Procedure: BASCILIC VEIN TRANSPOSITION;  Surgeon: Jama Cordella MATSU, MD;  Location: [facility] ORS;  Service: Vascular;  Laterality: Left;   BIOPSY  09/20/2020   Procedure: BIOPSY;  Surgeon: Rollin Dover, MD;  Location: [facility] ENDOSCOPY;  Service: Endoscopy;;   CHOLECYSTECTOMY     COLONOSCOPY WITH PROPOFOL  N/A 02/03/2018   Procedure: COLONOSCOPY WITH PROPOFOL ;  Surgeon: Unk Corinn Skiff, MD;  Location: [facility] ENDOSCOPY;  Service: Gastroenterology;  Laterality: N/A;   COLONOSCOPY WITH PROPOFOL  N/A 07/25/2022   Procedure: COLONOSCOPY WITH PROPOFOL ;  Surgeon: Maryruth Ole DASEN, MD;  Location: [facility] ENDOSCOPY;  Service: Endoscopy;  Laterality: N/A;   CORONARY ANGIOPLASTY  07/2015   STENT   CORONARY STENT INTERVENTION N/A 09/18/2016   Procedure: Coronary Stent Intervention;  Surgeon: Debby DELENA Sor, MD;  [facility];  Service: Cardiovascular;  Laterality: N/A;   DIALYSIS/PERMA CATHETER INSERTION N/A 05/10/2018   Procedure: DIALYSIS/PERMA CATHETER INSERTION;  Surgeon: Jama Cordella MATSU, MD;  Location: [facility];  Service: Cardiovascular;  Laterality: N/A;   DIALYSIS/PERMA CATHETER INSERTION N/A 01/21/2024   Procedure: DIALYSIS/PERMA CATHETER INSERTION;  Surgeon: Lanis Fonda BRAVO, MD;  Location: [facility] LAB;  Service: Cardiovascular;  Laterality: N/A;   DRESSING CHANGE UNDER ANESTHESIA Left 08/15/2017   Procedure: exploration of wound for bleeding;  Surgeon: Dessa Reyes ORN, MD;  Location: [facility] ORS;  Service: General;  Laterality: Left;   ESOPHAGOGASTRODUODENOSCOPY (EGD) WITH PROPOFOL  N/A 02/03/2018   Procedure: ESOPHAGOGASTRODUODENOSCOPY (EGD) WITH PROPOFOL ;  Surgeon: Unk Corinn Skiff, MD;  Location: [facility] ENDOSCOPY;  Service: Gastroenterology;  Laterality: N/A;   ESOPHAGOGASTRODUODENOSCOPY (EGD) WITH PROPOFOL  N/A 09/20/2020   Procedure: ESOPHAGOGASTRODUODENOSCOPY (EGD) WITH PROPOFOL ;  Surgeon: Rollin Dover, MD;  Location: [facility] ENDOSCOPY;  Service: Endoscopy;  Laterality: N/A;   ESOPHAGOGASTRODUODENOSCOPY (EGD) WITH PROPOFOL  N/A 07/25/2022   Procedure: ESOPHAGOGASTRODUODENOSCOPY (EGD) WITH PROPOFOL ;  Surgeon: Maryruth Ole DASEN, MD;  Location: [facility] ENDOSCOPY;  Service:  Endoscopy;  Laterality: N/A;   EYE SURGERY  11/17/2018   INCISION AND DRAINAGE ABSCESS Left 08/12/2017   Procedure: INCISION AND DRAINAGE ABSCESS;  Surgeon: Dessa Reyes ORN, MD;  Location: [facility] ORS;  Service: General;  Laterality: Left;   KNEE ARTHROSCOPY     LEFT HEART CATH N/A 09/18/2016   Procedure: Left Heart Cath;  Surgeon: Debby DELENA Sor, MD;  [facility];  Service: Cardiovascular;  Laterality: N/A;   LEFT HEART CATH AND CORONARY ANGIOGRAPHY N/A 09/16/2016   Procedure: Left Heart Cath and Coronary Angiography;  Surgeon: Lonni JONETTA Cash, MD;  Location: [facility] INVASIVE CV LAB;  Service: Cardiovascular;  Laterality: N/A;   LEFT HEART CATH AND CORONARY ANGIOGRAPHY N/A 04/29/2017   Procedure: LEFT HEART CATH AND CORONARY ANGIOGRAPHY;  Surgeon: Mady Lonni, MD;  [facility];  Service: Cardiovascular;  Laterality: N/A;   LOWER EXTREMITY ANGIOGRAPHY Right 03/08/2018   Procedure: LOWER EXTREMITY ANGIOGRAPHY;  Surgeon: Jama Cordella MATSU, MD;  Location: [facility] LAB;  Service: Cardiovascular;  Laterality: Right;   PERIPHERAL VASCULAR THROMBECTOMY  01/21/2024   Procedure: PERIPHERAL VASCULAR THROMBECTOMY;  Surgeon: Lanis Fonda BRAVO, MD;  Location: [facility];  Service: Cardiovascular;;   TUBAL LIGATION     TUBAL LIGATION     UPPER EXTREMITY ANGIOGRAPHY Right 09/19/2019   Procedure: UPPER EXTREMITY ANGIOGRAPHY;  Surgeon: Jama Cordella MATSU, MD;  Location: [facility];  Service: Cardiovascular;  Laterality: Right;   VENOUS STENT  01/21/2024   Procedure: VENOUS STENT;  Surgeon: Lanis Fonda BRAVO, MD;  Location: [facility];  Service: Cardiovascular;;   WOUND DEBRIDEMENT Left 12/21/2018   Procedure: DEBRIDEMENT WOUND;  Surgeon: Jama Cordella MATSU, MD;  Location: [facility] ORS;  Service: Vascular;  Laterality: Left;   WOUND DEBRIDEMENT Left 12/30/2018   Procedure: DEBRIDEMENT WOUND WITH VAC PLACEMENT (LEFT UPPER EXTREMITY);  Surgeon: Jama Cordella MATSU, MD;   Location: [facility] ORS;  Service: Vascular;  Laterality: Left;   Family History  Family History  Problem Relation Age of Onset   Colon cancer Mother    Heart attack Other    Heart attack Maternal Grandmother    Hypertension Sister    Hypertension Brother    Diabetes Paternal Grandmother    Breast cancer Neg Hx    Social History  reports that she quit smoking about 43 years ago. Her smoking use included cigarettes. She started smoking about 49 years ago. She has a 1.5 pack-year smoking history. She has never used smokeless tobacco. She reports that she does not currently use alcohol. She reports that she does not currently use drugs after having used the following drugs: Marijuana. Allergies  Allergies  Allergen Reactions   Shellfish Allergy Anaphylaxis and Swelling   Diazepam  Other (See Comments)    felt like out of body experience   Morphine  Itching and Other (See Comments)    Tolerated hydromorphone  on 07/2020 *will take along with Benadryl *   Home medications Prior to Admission medications   Medication Sig Start Date End Date Taking? Authorizing Provider  albuterol  (VENTOLIN  HFA) 108 (90 Base) MCG/ACT inhaler Inhale 2 puffs into the lungs every 4 (four) hours as needed for wheezing or shortness of breath. 06/28/20   Vivienne Delon HERO, PA-C  aspirin  81 MG EC tablet Take 1 tablet (81 mg total) by mouth daily. 09/20/20   Singh, Prashant K, MD  atorvastatin  (LIPITOR ) 80 MG tablet Take 1 tablet (80 mg total) by mouth every evening. Patient taking differently: Take 80 mg by mouth daily. 04/24/21   Bacigalupo, Angela M, MD  carvedilol  (COREG ) 12.5 MG tablet Take 1 tablet (12.5 mg total) by mouth 2 (two) times daily with a meal. 09/19/21   Paige, Richerd PARAS, DO  Continuous Blood Gluc Receiver (FREESTYLE LIBRE 14 DAY READER) DEVI To check blood sugar ACHS 09/27/20   Vivienne Delon HERO, PA-C  Continuous Blood Gluc Sensor (FREESTYLE LIBRE 14 DAY SENSOR) MISC To check blood sugar ACHS; change  every 14 days 09/27/20   Vivienne Delon HERO, PA-C  fluticasone  (FLONASE ) 50 MCG/ACT nasal spray Place 2 sprays into both nostrils daily as needed for allergies or rhinitis. 09/28/17   Vivienne Delon HERO, PA-C  fluticasone  furoate-vilanterol (BREO ELLIPTA ) 200-25 MCG/INH AEPB Inhale 1 puff into the lungs daily. 08/30/20   Vivienne Delon HERO, PA-C  Glucose Blood (BLOOD GLUCOSE TEST STRIPS 333) STRP Enough test stripes to test blood sugar TID x 30 days. No refills 04/16/22   Trudy Anthony HERO, MD  HUMULIN   70/30 KWIKPEN (70-30) 100 UNIT/ML KwikPen Inject 30 Units into the skin 2 (two) times daily. 05/26/22   [provider]  hydrOXYzine  (ATARAX ) 25 MG tablet Take 1 tablet (25 mg total) by mouth 2 (two) times daily. 11/17/22   Hongalgi, Anand D, MD  Insulin  Pen Needle 32G X 4 MM MISC Enough pen needles for 100 units of novolin  QAM x 30 days. No refills 04/16/22   Trudy Anthony HERO, MD  LEVEMIR  FLEXPEN 100 UNIT/ML FlexPen Inject 25 Units into the skin daily. 11/02/22   Harden Jerona GAILS, MD  lidocaine  (LIDODERM ) 5 % Place 1 patch onto the skin daily. Remove & Discard patch within 12 hours or as directed by MD 06/25/23   Nettie, April, MD  methocarbamol  (ROBAXIN ) 500 MG tablet Take 1 tablet (500 mg total) by mouth 2 (two) times daily. 11/02/22   Harden Jerona GAILS, MD  midodrine  (PROAMATINE ) 10 MG tablet Take 10 mg by mouth See admin instructions. Take 10 mg by mouth 30 minutes before dialysis on Tues/Thurs/Sat 12/23/21   [provider]  naloxone  (NARCAN ) nasal spray 4 mg/0.1 mL Place 1 spray into the nose as directed. 04/08/22   [provider]  nitroGLYCERIN  (NITRODUR - DOSED IN MG/24 HR) 0.2 mg/hr patch Place 1 patch (0.2 mg total) onto the skin daily. Apply a patch to the dorsum of the left ankle.  Change location daily. 11/17/22   Hongalgi, Anand D, MD  nitroGLYCERIN  (NITRODUR - DOSED IN MG/24 HR) 0.2 mg/hr patch Place 1 patch (0.2 mg total) onto the skin daily. Apply 1 patch to the  dorsum of the ankle and change location daily.  Wash ankle and foot with soap and water daily dry before applying patch. 12/24/22 12/24/23  Harden Jerona GAILS, MD  nitroGLYCERIN  (NITROSTAT ) 0.4 MG SL tablet Place 1 tablet (0.4 mg total) under the tongue every 5 (five) minutes x 3 doses as needed for chest pain. 05/03/20   Vivienne Delon HERO, PA-C  Oxycodone  HCl 10 MG TABS Take 10 mg by mouth 4 (four) times daily. 04/01/22   [provider]  oxyCODONE -acetaminophen  (PERCOCET) 10-325 MG tablet Take 1 tablet by mouth every 6 (six) hours as needed for pain. 04/29/23   Laurice Maude BROCKS, MD  pantoprazole  (PROTONIX ) 40 MG tablet Take 40 mg by mouth daily before breakfast.    [provider]  pregabalin  (LYRICA ) 100 MG capsule TAKE 1 CAPSULE BY MOUTH TWICE A DAY 07/13/23   Harden Jerona GAILS, MD  torsemide  (DEMADEX ) 10 MG tablet Take 10 mg by mouth See admin instructions. Take 10 mg by mouth every other morning    [provider]  valACYclovir  (VALTREX ) 500 MG tablet TAKE (1) TABLET BY MOUTH EVERY OTHER DAY. Patient taking differently: Take 500 mg by mouth daily. 01/24/21   Bacigalupo, Angela M, MD  VELPHORO  500 MG chewable tablet Chew 1,500 mg by mouth 3 (three) times daily. 09/11/21   [provider]  gabapentin  (NEURONTIN ) 100 MG capsule Take 1 capsule (100 mg total) by mouth 3 (three) times daily. 08/19/17 02/03/19  Tobie Calix, MD     Vitals:   03/03/24 0945 03/03/24 1000 03/03/24 1100 03/03/24 1115  BP: (!) 152/67 (!) 136/95 (!) 121/54 121/81  Pulse: 92 94 94 90  Resp: 15 11 17 19   Temp:      TempSrc:      SpO2: 100% 100% 93% 96%  Height:       Exam Gen alert, no distress, [place] O2 No rash,  cyanosis or gangrene Sclera anicteric, throat clear  No jvd or bruits Chest bilat rales/ rhonchi at bases RRR no MRG Abd soft ntnd no mass or ascites +bs GU deferred MS no joint effusions or deformity Ext 1-2 RLE edema, no LLE edema, no other edema Neuro is alert, Ox 3 , nf     LUA AVF+bruit   Home bp meds: Coreg  12.5 bid Demadex  10mg  qam NTG patch Midodrine  10mg  pre hd TTS   OP HD: [facility] TTS 4.5h  100.8kg   B450    L AVF   Heparin  3000 (from [facility])    Assessment/ Plan: Resp distress: w/ severe bilat pulm edema by CXR. Likely this is due to vol overload d/t missed HD +/- other. Plan HD asap upstairs. She is out of town visiting family here.  ESRD: on HD TTS. Plan is for HD this afternoon.  HTN: on coreg  at home, hold for now. Here BP's wnl, follow BP: chronic hypotension, takes midodrine  10mg  pre HD, will cont here.  Volume: as above Anemia of esrd: Hb 9-11 here, follow.      Myer Fret  MD [facility] 03/03/2024, 11:35 AM  Recent Labs  Lab 03/03/24 0842 03/03/24 0855 03/03/24 0916  HGB 11.2* 8.5*  --   CALCIUM   --   --  8.6*  CREATININE 9.50*  --  9.26*  K 5.5* 4.8 5.7*   Inpatient medications:   nitroGLYCERIN  5 mcg/min (03/03/24 0908)

## 2024-03-03 NOTE — Plan of Care (Signed)
 FMTS Brief Progress Note  S: She is somewhat better in regard to her breathing after dialysis, however she still has significant orthopnea and shortness of breath. No CP. No abd pain. Last BM yesterday. Known murmur.  O: BP (!) 137/106 (BP Location: Right Wrist)   Pulse 97   Temp 98.7 F (37.1 C) (Oral)   Resp 17   Ht 5' 5 (1.651 m)   Wt 109.6 kg   SpO2 94%   BMI 40.21 kg/m    Gen: Sitting up in bed with nasal cannula Resp: Expiratory wheezes in the superior lungs. Crackles bilaterally at the bases. No increased work of breathing. Able to talk in full sentences.  CV: tachycardic, loud holosystolic murmur Abd: soft, obese, nontender LE: 1+ pitting edema bilaterally UE: no asterixis  Echo 09/19/21 with noted valvular abnormalities, EF 60-65%  A/P: This is a 68 year old female with PMH of ESRD on TTS HD, asthma, and HFpEF who missed her last two dialysis sessions now with respiratory distress and volume overload. She is also wheezy on exam, so there may be an asthma component to her respiratory distress. - discontinue nitroglycerin  gtt - once torsemide  20 mg PO to try to get some extra fluid off - offer duoneb which is ordered PRN - echo ordered - ABG this morning was never received by the lab, I do not think we need another at this point. - Orders reviewed. Labs for AM ABG, Mg, RFP ordered.  Alena Morrison, Elio, MD 03/03/2024, 8:44 PM PGY-1, [facility] Family [facility] Night Resident  Please page [phone] with questions.

## 2024-03-03 NOTE — H&P (Addendum)
 Hospital Admission History and Physical Service Pager: [phone]  Patient name: [name] Medical record number: [id number] Date of Birth: June 18, 1956 Age: 68 y.o. Gender: female  Primary Care Provider: Elizbeth Leita Ruth, FNP Consultants: Nephrology Code Status: Full  Preferred Emergency Contact: Daughter, Gurleen Larrivee [phone]  Chief Complaint: SOB, chest tightness  Assessment and Plan: [name] is a 68 y.o. female presenting with SOB and chest tightness.  Presented to ED and transition to BiPAP for acute hypoxic respiratory failure, quickly weaned to nasal cannula. CXR with pulmonary edema likely multifactorial in setting of ESRD and missed dialysis, diuretic nonadherence with known HFpEF.  History of asthma, once more euvolemic with hemodialysis, will reassess for asthma exacerbation-in the meantime continue home inhalers.  Low concern for pneumonia given unremarkable lab work, afebrile-procalcitonin unlikely to be helpful given ESRD we will monitor clinically.  Nephrology consulted for dialysis. Assessment & Plan Acute hypoxic respiratory failure (HCC) 2/2 volume overload.  Currently on 4L [place], baseline no oxygen . Dialysis as and diureses (see below). - Admit to FMTS, progressive, attending Dr. Anders - Reevaluate wheezing after dialysis, consider treatment for asthma exacerbation - Continue home Breo Ellipta , DuoNebs every 4 hours as needed - Wean O2 as tolerated - Update echocardiogram for HFpEF ESRD (end stage renal disease) (HCC) - Consult nephrology, appreciate recs - Plan for hemodialysis today - Restart Torsemide  pending nephro recs - AM RFP on dialysis days Chronic health problem - ESRD on dialysis, resume dialysis - CAD s/p stenting, continue aspirin  - HTN - continue  midodrine , holding carvedilol , amlodipine  due to normal pressures. Will confirm amlodipine  use with pharmacy - HLD - continue atorvastatin  - T2DM - holding ozempic, will update A1c,  monitor CBGs add insulin  if necessary - HFpEF - holding meds as above.  - Chronic pain  Neuropathy: Receives buprenorphine patch, oxycodone  10 mg every 6 hours as needed, and pregabalin  100 mg daily.  Question dosing of pregabalin  given dialysis, we will review this prior to restarting.  We will need to see when buprenorphine patch is due for removal (Sunday?).  We restarted her oxycodone , after reviewing PDMP and confirmed that she is filling this medication.  FEN/GI: renal/carb modified with fluid restriction VTE Prophylaxis: Heparin  (ESRD on HD)  Disposition: Card Tele  History of Present Illness:  [name] is a 68 y.o. female with PMHx ESRD on dialysis, CAD s/p stenting, OSA, HTN, HLD, T2DM, GERD, HFpEF, mild AS, COPD presenting with SOB, chest tightness, orthopnea for the past few days. Patient receives dialysis on Tues/Th/Sat but missed it yesterday due to travel, last dialysis on Tues. She also uses torsemide  but recently ran out of it. Patient reports cough, chills. Denies fever, sick contacts, N/V/D. No advanced directive or living will.  From chart review, SpO2 ~50% while with EMS so patient was started on CPAP with some improvement but was bagged en route due to additional desaturations. EMS reported wet lung sounds throughout with severe dyspnea. Patient was given albuterol  and atrovent  en route.  In the ED, SpO2 65-70% on RA so BiPAP applied on arrival. Later transitioned to 4L [place] per patient request which she tolerated well. Workup included EKG, ABG, BNP, CBC, Cr, Hep B, HIV antibody, renal function panel. 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   Objective: BP 121/81   Pulse 90   Temp 97.6 F (36.4 C) (Temporal)   Resp 19   Ht 5' 5 (1.651 m)   SpO2 96%   BMI 41.16 kg/m   Exam: General: Alert, NAD.  HEENT: Normocephalic, atraumatic. Cardiovascular: RRR. No murmurs, rubs, or gallops appreciated. Pulmonary: Rales heard bilaterally Abdomen: Soft, non-tender, non-distended. Neurologic: No focal deficits. Psych: Appropriate mood and affect  Labs:  CBC BMET  Recent Labs  Lab 03/03/24 0855  HGB 8.5*  HCT 25.0*   Recent Labs  Lab 03/03/24 0916  NA 141  K 5.7*  CL 101  CO2 22  BUN 53*  CREATININE 9.26*  GLUCOSE 232*  CALCIUM  8.6*       Imaging Studies Performed:  My Interpretation:  CXR: Newly seen interstitial and alveolar density consistent with pulmonary edema. Superimposed focal density in the perihilar regions and lower lobes right more than left could possibly indicate associated pneumonia. Congestive heart failure is favored primarily.  Jerrie Gathers, DO 03/03/2024, 11:35 AM PGY-1, [facility]  FPTS Intern pager: [phone], text pages welcome Secure chat group [facility] [address] Teaching Service   Upper Level Addendum: I have seen and evaluated this patient along with Dr. Jerrie and reviewed the above note, making necessary revisions as appropriate. I agree with the medical decision making and  physical exam as noted above. Gladis Church, DO PGY-3 [facility] Family Medicine Residency

## 2024-03-03 NOTE — Progress Notes (Signed)
 Pt receives op HD at [facility], TTS, 1025 chair time.   Contacted clinic to inform of pt arrival. Clinic stated their manager is working along side with this pt and her family to do a transfer to a [place] HD clinic at this time. Unsure of which one they will chose, but requested to be notified of this change once decided, name and number left with [facility]. Will continue to assist as needed.   Lavanda Lindwood Mogel Dialysis Navigator [phone]

## 2024-03-03 NOTE — Plan of Care (Signed)
 Reviewed EKG.  Sinus rhythm.  Nonspecific T wave changes.  Noticeable biphasic T wave only in lead V1, low concern for Wellens syndrome but will repeat EKG later this evening to ensure no extension into other leads. Repeat troponins if patient develops chest pain.

## 2024-03-03 NOTE — ED Notes (Signed)
 EDP notified about unable to obtain blood work

## 2024-03-04 ENCOUNTER — Inpatient Hospital Stay (HOSPITAL_COMMUNITY)

## 2024-03-04 DIAGNOSIS — J9601 Acute respiratory failure with hypoxia: Secondary | ICD-10-CM | POA: Diagnosis not present

## 2024-03-04 DIAGNOSIS — I5030 Unspecified diastolic (congestive) heart failure: Secondary | ICD-10-CM | POA: Diagnosis not present

## 2024-03-04 DIAGNOSIS — N186 End stage renal disease: Secondary | ICD-10-CM | POA: Diagnosis not present

## 2024-03-04 DIAGNOSIS — J810 Acute pulmonary edema: Secondary | ICD-10-CM | POA: Diagnosis not present

## 2024-03-04 DIAGNOSIS — Z992 Dependence on renal dialysis: Secondary | ICD-10-CM | POA: Diagnosis not present

## 2024-03-04 LAB — RENAL FUNCTION PANEL
Albumin: 3.1 g/dL — ABNORMAL LOW (ref 3.5–5.0)
Anion gap: 9 (ref 5–15)
BUN: 36 mg/dL — ABNORMAL HIGH (ref 8–23)
CO2: 29 mmol/L (ref 22–32)
Calcium: 8.8 mg/dL — ABNORMAL LOW (ref 8.9–10.3)
Chloride: 98 mmol/L (ref 98–111)
Creatinine, Ser: 6.9 mg/dL — ABNORMAL HIGH (ref 0.44–1.00)
GFR, Estimated: 6 mL/min — ABNORMAL LOW (ref >60)
Glucose, Bld: 155 mg/dL — ABNORMAL HIGH (ref 70–99)
Phosphorus: 5.3 mg/dL — ABNORMAL HIGH (ref 2.5–4.6)
Potassium: 4.4 mmol/L (ref 3.5–5.1)
Sodium: 136 mmol/L (ref 135–145)

## 2024-03-04 LAB — ECHOCARDIOGRAM COMPLETE
AR max vel: 1.03 cm2
AV Area VTI: 1.03 cm2
AV Area mean vel: 1.01 cm2
AV Mean grad: 26 mmHg
AV Peak grad: 43.8 mmHg
Ao pk vel: 3.31 m/s
Area-P 1/2: 3.48 cm2
Calc EF: 63.7 %
Height: 65 in
MV VTI: 1.47 cm2
S' Lateral: 2.9 cm
Single Plane A2C EF: 62.5 %
Single Plane A4C EF: 64.4 %
Weight: 3865.99 [oz_av]

## 2024-03-04 LAB — GLUCOSE, CAPILLARY
Glucose-Capillary: 146 mg/dL — ABNORMAL HIGH (ref 70–99)
Glucose-Capillary: 122 mg/dL — ABNORMAL HIGH (ref 70–99)
Glucose-Capillary: 216 mg/dL — ABNORMAL HIGH (ref 70–99)
Glucose-Capillary: 168 mg/dL — ABNORMAL HIGH (ref 70–99)

## 2024-03-04 LAB — MAGNESIUM: Magnesium: 2 mg/dL (ref 1.7–2.4)

## 2024-03-04 MED ORDER — HEPARIN SODIUM (PORCINE) 1000 UNIT/ML IJ SOLN
INTRAMUSCULAR | Status: AC
Start: 2024-03-04 — End: 2024-03-04
  Filled 2024-03-04: qty 3

## 2024-03-04 MED ORDER — INSULIN ASPART 100 UNIT/ML IJ SOLN
0.0000 [IU] | Freq: Three times a day (TID) | INTRAMUSCULAR | Status: DC
  Administered 2024-03-04: 2 [IU] via SUBCUTANEOUS
  Administered 2024-03-05 – 2024-03-08 (×4): 1 [IU] via SUBCUTANEOUS

## 2024-03-04 MED ORDER — HEPARIN SODIUM (PORCINE) 1000 UNIT/ML DIALYSIS: 1000.0000 [IU] | INTRAMUSCULAR | Status: DC | PRN

## 2024-03-04 MED ORDER — PENTAFLUOROPROP-TETRAFLUOROETH EX AERO: 1.0000 | INHALATION_SPRAY | CUTANEOUS | Status: DC | PRN

## 2024-03-04 MED ORDER — LIDOCAINE HCL (PF) 1 % IJ SOLN: 5.0000 mL | INTRAMUSCULAR | Status: DC | PRN

## 2024-03-04 MED ORDER — ALTEPLASE 2 MG IJ SOLR: 2.0000 mg | Freq: Once | INTRAMUSCULAR | Status: DC | PRN

## 2024-03-04 MED ORDER — ANTICOAGULANT SODIUM CITRATE 4% (200MG/5ML) IV SOLN
5.0000 mL | Status: DC | PRN
Start: 2024-03-04 — End: 2024-03-04

## 2024-03-04 MED ORDER — HEPARIN SODIUM (PORCINE) 1000 UNIT/ML DIALYSIS
20.0000 [IU]/kg | INTRAMUSCULAR | Status: DC | PRN
  Administered 2024-03-04: 2200 [IU] via INTRAVENOUS_CENTRAL

## 2024-03-04 MED ORDER — LIDOCAINE-PRILOCAINE 2.5-2.5 % EX CREA: 1.0000 | TOPICAL_CREAM | CUTANEOUS | Status: DC | PRN

## 2024-03-04 NOTE — Progress Notes (Signed)
 [facility] Progress Note   Subjective: Seen in room. Completed dialysis yesterday with 5L removed but continues to endorse shortness of breath on exertion. Got winded working with PT this am. Plan for dialysis again today.   Objective Vitals:   03/03/24 2325 03/04/24 0139 03/04/24 0359 03/04/24 0754  BP: (!) 146/48  (!) 145/63 (!) 145/45  Pulse: 90 82 79 82  Resp: 17  15 18   Temp: 100.1 F (37.8 C) 98.4 F (36.9 C) 98.7 F (37.1 C) 98 F (36.7 C)  TempSrc: Oral Oral Oral Oral  SpO2: 99% 100% 99% 92%  Weight:      Height:         Additional Objective Labs: Basic Metabolic Panel: Recent Labs  Lab 03/03/24 0842 03/03/24 0855 03/03/24 0916 03/03/24 1140 03/03/24 1336 03/04/24 0345  NA 136   < > 141 136  --  136  K 5.5*   < > 5.7* 5.3*  --  4.4  CL 103  --  101  --   --  98  CO2  --   --  22  --   --  29  GLUCOSE 237*  --  232*  --   --  155*  BUN 50*  --  53*  --   --  36*  CREATININE 9.50*  --  9.26*  --  9.61* 6.90*  CALCIUM   --   --  8.6*  --   --  8.8*  PHOS  --   --   --   --   --  5.3*   < > = values in this interval not displayed.   CBC: Recent Labs  Lab 03/03/24 1015 03/03/24 1140 03/03/24 1336  WBC 9.8  --  9.5  NEUTROABS 8.7*  --   --   HGB 8.1* 8.5* 8.3*  HCT 26.0* 25.0* 26.8*  MCV 85.8  --  87.0  PLT 92*  --  98*   Blood Culture    Component Value Date/Time   SDES BLOOD SITE NOT SPECIFIED 11/15/2022 0949   SPECREQUEST  11/15/2022 0949    BOTTLES DRAWN AEROBIC ONLY Blood Culture results may not be optimal due to an inadequate volume of blood received in culture bottles   CULT  11/15/2022 0949    NO GROWTH 5 DAYS Performed at [facility], [address], [place], [place] [address]    REPTSTATUS 11/20/2022 FINAL 11/15/2022 9050     Physical Exam General: Alert, nad, on nasal oxygen   Heart: RRR Lungs: rhonci/crackles at bases  Abdomen: non tender Extremities: 1+LE edema  Dialysis Access: L AVF +bruit    Medications:   aspirin  EC  81 mg Oral Daily   atorvastatin   80 mg Oral Daily   Chlorhexidine  Gluconate Cloth  6 each Topical Q0600   fluticasone  furoate-vilanterol  1 puff Inhalation Daily   heparin   5,000 Units Subcutaneous Q8H   midodrine   10 mg Oral Q T,Th,Sat-1800    Dialysis Orders: [facility] TTS 4.5h  100.8kg   B450    L AVF   Heparin  3000 (from [facility]) Cinacalcet  60 three times per week Hectorol  5 mcg three times per week Mircera 75 q 2 wks (last 02/29/24)   Assessment/Plan: Acute hypoxic respiratory failure:  severe bilat pulm edema by CXR. Likely this is due to vol overload d/t missed HD +/- other. Had urgent dialysis Friday net UF 5L. Remains dyspneic with supp oxygen  requirement. HD again today for volume removal  ESRD: on HD TTS. Plan is for HD this afternoon/evening   HTN: on coreg  at home, hold for now. BP acceptable  BP: chronic hypotension, takes midodrine  10mg  pre HD, will cont here.  Volume: as above. On admit was >10kg over EDW.  Torsemide  on non HD days - will order here. UF as able  Anemia: Hgb 8.3. On ESA as outpatient. Recently dosed. Follow trends. Transfuse prn  DM2: Insulin  per primary   Maisie Ronnald Acosta PA-C [facility] 03/04/2024,10:11 AM

## 2024-03-04 NOTE — Progress Notes (Signed)
 Echocardiogram 2D Echocardiogram has been performed.  Hulon Ferron N Rae Sutcliffe,RDCS 03/04/2024, 10:50 AM

## 2024-03-04 NOTE — Progress Notes (Signed)
     Daily Progress Note Intern Pager: [phone]  Patient name: [name] Medical record number: [id number] Date of birth: 1955/09/15 Age: 68 y.o. Gender: female  Primary Care Provider: Elizbeth Leita Ruth, FNP Consultants: Nephrololgy Code Status: Full  Pt Overview and Major Events to Date:  08/22: Admitted for acute hypoxic respiratory failure 2/2 pulmonary edema  Assessment and Plan:  Heather CHRISTELLA. [name] is a 68 y.o. F admitted for acute hypoxic respiratory failure with SOB and chest tightness. Likely pulmonary edema multifactorial in setting of ESRD and missed dialysis, diuretic nonadherence with known HFpEF.  Assessment & Plan Acute hypoxic respiratory failure (HCC) 2/2 volume overload. Currently on 4L LFNC, baseline no oxygen . Improving today. - Continue home Breo Ellipta , DuoNebs every 4 hours as needed - Wean O2 as tolerated - Echo pending  ESRD (end stage renal disease) on dialysis [facility]) - Consult nephrology, appreciate recs  - Last inpatient HD Friday, 08/22  - Still overloaded, HD again today 08/23  - Torsemide  20mg  on non-HD days - AM RFP on dialysis days Chronic health problem ESRD: continue T/Th/Sat dialysis CAD: s/p PCI, continue aspirin  HTN: continue  midodrine , holding carvedilol , amlodipine  due to normal pressures HLD: continue atorvastatin  T2DM: holding ozempic, will update A1c, monitor CBGs add insulin  if necessary HFpEF: holding meds as above.  Chronic pain/neuropathy: Receives buprenorphine  patch, oxycodone  10 mg every 6 hours as needed, and pregabalin  100 mg daily.  Question dosing of pregabalin  given dialysis, we will review this prior to restarting.  We will need to see when buprenorphine  patch is due for removal (Sunday?).  We restarted her oxycodone , after reviewing PDMP and confirmed that she is filling this medication.  FEN/GI: renal diet  PPx: Heparin  Dispo:Home pending clinical improvement .   Subjective:  Patient was seen and examined at  bedside. She sounds audibly wheezy. She states her breathing feels better since receiving her Breo Ellipta  inhaler this AM.   Objective: Temp:  [98 F (36.7 C)-100.1 F (37.8 C)] 98 F (36.7 C) (08/23 0754) Pulse Rate:  [79-97] 82 (08/23 0754) Resp:  [9-19] 18 (08/23 0754) BP: (100-151)/(45-106) 145/45 (08/23 0754) SpO2:  [91 %-100 %] 92 % (08/23 0754) Weight:  [109.6 kg-114.6 kg] 109.6 kg (08/22 1815)  Physical Exam: General: sitting upright in recliner after breakfast, NAD, A&O x3 Cardiovascular: RRR, no m/r/g Respiratory: good air movement b/l, CTAB, no w/r/r in lung fields however ?upper airway noise/wheezing when patient speaks, speaks in short sentences but doesn't appear SOB or with increase in WOB Abdomen: soft, non-tender Extremities: mild non-pitting edema b/l  Laboratory: Most recent CBC Lab Results  Component Value Date   WBC 9.5 03/03/2024   HGB 8.3 (L) 03/03/2024   HCT 26.8 (L) 03/03/2024   MCV 87.0 03/03/2024   PLT 98 (L) 03/03/2024   Most recent BMP    Latest Ref Rng & Units 03/04/2024    3:45 AM  BMP  Glucose 70 - 99 mg/dL 844   BUN 8 - 23 mg/dL 36   Creatinine 9.55 - 1.00 mg/dL 3.09   Sodium 864 - 854 mmol/L 136   Potassium 3.5 - 5.1 mmol/L 4.4   Chloride 98 - 111 mmol/L 98   CO2 22 - 32 mmol/L 29   Calcium  8.9 - 10.3 mg/dL 8.8    Lupie Credit, DO 03/04/2024, 10:42 AM  PGY-1, [facility] FPTS Intern pager: [phone], text pages welcome Secure chat group [facility] Teaching Service

## 2024-03-04 NOTE — Progress Notes (Signed)
 Received patient in bed to unit.  Alert and oriented.  Informed consent signed and in chart.   TX duration:3 hours  Patient tolerated well.  Transported back to the room  Alert, without acute distress.  Hand-off given to patient's nurse.   Access used: Lef AV fistula Upper Arm Access issues: none  Total UF removed: 4L   03/04/24 1918  Vitals  Temp 98 F (36.7 C)  Temp Source Oral  BP (!) 141/67  Pulse Rate 84  ECG Heart Rate 87  Resp 15  Weight 102.8 kg  Type of Weight Post-Dialysis  Oxygen  Therapy  SpO2 99 %  O2 Device Nasal Cannula  O2 Flow Rate (L/min) 1 L/min  During Treatment Monitoring  Duration of HD Treatment -hour(s) 3 hour(s)  HD Safety Checks Performed Yes  Intra-Hemodialysis Comments Tx completed  Post Treatment  Dialyzer Clearance Lightly streaked  Liters Processed 72  Fluid Removed (mL) 4000 mL  Tolerated HD Treatment Yes  Post-Hemodialysis Comments tolerated well  AVG/AVF Arterial Site Held (minutes) 7 minutes  AVG/AVF Venous Site Held (minutes) 7 minutes  Fistula / Graft Left Upper arm Arteriovenous fistula  Placement Date/Time: 07/24/22 0745   Placed prior to admission: Yes  Orientation: Left  Access Location: Upper arm  Access Type: Arteriovenous fistula  Status Deaccessed     Camellia Brasil LPN [facility]

## 2024-03-04 NOTE — Evaluation (Signed)
 Physical Therapy Evaluation Patient Details Name: [name] MRN: [id number] DOB: Feb 17, 1956 Today's Date: 03/04/2024  History of Present Illness  68 Y/O F presents 8/22 with SOB following missed dialysis; Acute Hypoxic Respiratory Failure, and hyperkalemia PMHx of CAD, ESRD on HD, DM2, HTN, Hep C.  Clinical Impression  Pt admitted with above diagnosis. Questionable history as she is a bit confused. Plans to stay with sister after d/c. She reportedly was independent with mobility but required assist intermittently with bathing/dressing. She required up to mod assist for bed mobility and transfers out of bed today. Currently recommend continued inpatient follow up therapy, <3 hours/day but will update recs if she progresses quickly. Pt currently with functional limitations due to the deficits listed below (see PT Problem List). Pt will benefit from acute skilled PT to increase their independence and safety with mobility to allow discharge.           If plan is discharge home, recommend the following: A lot of help with walking and/or transfers;A lot of help with bathing/dressing/bathroom;Assistance with feeding;Supervision due to cognitive status;Help with stairs or ramp for entrance;Assist for transportation   Can travel by private vehicle   Yes    Equipment Recommendations Rolling walker (2 wheels)  Recommendations for Other Services       Functional Status Assessment Patient has had a recent decline in their functional status and demonstrates the ability to make significant improvements in function in a reasonable and predictable amount of time.     Precautions / Restrictions Precautions Precautions: Fall Recall of Precautions/Restrictions: Intact Restrictions Weight Bearing Restrictions Per Provider Order: No      Mobility  Bed Mobility Overal bed mobility: Needs Assistance Bed Mobility: Supine to Sit     Supine to sit: Mod assist, HOB elevated, Used rails     General  bed mobility comments: Mod assist for LE support and trunk to rise to edge of bed. Cues for technique.    Transfers Overall transfer level: Needs assistance Equipment used: Rolling walker (2 wheels) Transfers: Sit to/from Stand, Bed to chair/wheelchair/BSC Sit to Stand: Mod assist, From elevated surface   Step pivot transfers: Min assist       General transfer comment: Mod assist for boost to stand from slightly elevated bed surface. Min assist to step pivot to recliner. No buckling but has difficulty clearing feet and sequencing.    Ambulation/Gait               General Gait Details: Deferred for safety due to weakness.  Stairs            Wheelchair Mobility     Tilt Bed    Modified Rankin (Stroke Patients Only)       Balance Overall balance assessment: Needs assistance Sitting-balance support: No upper extremity supported, Feet supported Sitting balance-Leahy Scale: Fair     Standing balance support: Bilateral upper extremity supported Standing balance-Leahy Scale: Poor                               Pertinent Vitals/Pain Pain Assessment Pain Assessment: Faces Faces Pain Scale: Hurts little more Pain Location: headache, posteriorly Pain Descriptors / Indicators: Aching Pain Intervention(s): Limited activity within patient's tolerance, Monitored during session, Repositioned    Home Living Family/patient expects to be discharged to:: Private residence Living Arrangements: Other relatives (Plans to stay with sister) Available Help at Discharge: Family;Available 24 hours/day Type of Home: House Home Access:  Stairs to enter Entrance Stairs-Rails: None Entrance Stairs-Number of Steps: 1 (2 steps as soon as she enters house no rail.) Alternate Level Stairs-Number of Steps: 13 Home Layout: Two level;Able to live on main level with bedroom/bathroom (half bath) Home Equipment: Cane - quad Additional Comments: Info above is to the best of her  knowledge regarding her sister's home, where she plans to stay after d/c.    Prior Function Prior Level of Function : Independent/Modified Independent             Mobility Comments: Uses quad cane to ambulate. Several falls in the past 6 months ADLs Comments: Sister sometimes helps with bathing/dressing. Takes a long time to dress herself     Extremity/Trunk Assessment   Upper Extremity Assessment Upper Extremity Assessment: Defer to OT evaluation    Lower Extremity Assessment Lower Extremity Assessment: Generalized weakness       Communication   Communication Communication: No apparent difficulties    Cognition Arousal: Alert Behavior During Therapy: WFL for tasks assessed/performed   PT - Cognitive impairments: No family/caregiver present to determine baseline, Orientation, Awareness, Memory, Problem solving   Orientation impairments:  (Took a long time to recall month and year. Is confused about situation but mostly aware. Not sure how long she has been staying with sister.)                     Following commands: Intact       Cueing Cueing Techniques: Verbal cues     General Comments General comments (skin integrity, edema, etc.): At rest, SpO2 100% on 3L. 84-8% on RA. 96% on 2L    Exercises General Exercises - Lower Extremity Ankle Circles/Pumps: AROM, Both, 10 reps, Seated Quad Sets: Strengthening, Both, 10 reps, Seated Gluteal Sets: Strengthening, Both, 10 reps, Seated   Assessment/Plan    PT Assessment Patient needs continued PT services  PT Problem List Decreased strength;Decreased range of motion;Decreased activity tolerance;Decreased balance;Decreased mobility;Decreased cognition;Decreased knowledge of use of DME;Decreased safety awareness;Decreased knowledge of precautions;Cardiopulmonary status limiting activity;Obesity;Pain       PT Treatment Interventions DME instruction;Gait training;Stair training;Functional mobility  training;Therapeutic activities;Therapeutic exercise;Balance training;Neuromuscular re-education;Cognitive remediation;Patient/family education    PT Goals (Current goals can be found in the Care Plan section)  Acute Rehab PT Goals Patient Stated Goal: Get well PT Goal Formulation: With patient Time For Goal Achievement: 03/17/24 Potential to Achieve Goals: Good    Frequency Min 2X/week     Co-evaluation               AM-PAC PT 6 Clicks Mobility  Outcome Measure Help needed turning from your back to your side while in a flat bed without using bedrails?: A Little Help needed moving from lying on your back to sitting on the side of a flat bed without using bedrails?: A Lot Help needed moving to and from a bed to a chair (including a wheelchair)?: A Lot Help needed standing up from a chair using your arms (e.g., wheelchair or bedside chair)?: A Lot Help needed to walk in hospital room?: A Lot Help needed climbing 3-5 steps with a railing? : Total 6 Click Score: 12    End of Session Equipment Utilized During Treatment: Gait belt;Oxygen  Activity Tolerance: Patient limited by fatigue Patient left: in chair;with call bell/phone within reach;with chair alarm set;with nursing/sitter in room Nurse Communication: Mobility status PT Visit Diagnosis: Unsteadiness on feet (R26.81);Other abnormalities of gait and mobility (R26.89);Difficulty in walking, not elsewhere classified (R26.2);Muscle weakness (  generalized) (M62.81);History of falling (Z91.81);Pain Pain - part of body:  (headache)    Time: 9265-9193 PT Time Calculation (min) (ACUTE ONLY): 32 min   Charges:   PT Evaluation $PT Eval Low Complexity: 1 Low PT Treatments $Therapeutic Activity: 8-22 mins PT General Charges $$ ACUTE PT VISIT: 1 Visit         Leontine Roads, PT, DPT [facility]  Rehabilitation Services Physical Therapist Office: [phone] Website: [email or web address]   Leontine GORMAN Roads 03/04/2024, 9:45  AM

## 2024-03-04 NOTE — Assessment & Plan Note (Addendum)
 ESRD: continue T/Th/Sat dialysis CAD: s/p PCI, continue aspirin  HTN: continue  midodrine , holding carvedilol , amlodipine  due to normal pressures HLD: continue atorvastatin  T2DM: holding ozempic, will update A1c, monitor CBGs add insulin  if necessary HFpEF: holding meds as above.  Chronic pain/neuropathy: Receives buprenorphine  patch, oxycodone  10 mg every 6 hours as needed, and pregabalin  100 mg daily.  Question dosing of pregabalin  given dialysis, we will review this prior to restarting.  We will need to see when buprenorphine  patch is due for removal (Sunday?).  We restarted her oxycodone , after reviewing PDMP and confirmed that she is filling this medication.

## 2024-03-04 NOTE — Assessment & Plan Note (Addendum)
 2/2 volume overload. Currently on 4L LFNC, baseline no oxygen . Improving today. - Continue home Breo Ellipta , DuoNebs every 4 hours as needed - Wean O2 as tolerated - Echo pending

## 2024-03-04 NOTE — Assessment & Plan Note (Addendum)
-   Consult nephrology, appreciate recs  - Last inpatient HD Friday, 08/22  - Still overloaded, HD again today 08/23  - Torsemide  20mg  on non-HD days - AM RFP on dialysis days

## 2024-03-04 NOTE — Plan of Care (Signed)
 Spoke to patient at bedside to confirm home DM regimen. She has not taken any insulin  in ~3 months since her PCP advised her to stop. She only takes 2mg  Ozempic weekly on Sundays now.

## 2024-03-04 NOTE — Evaluation (Addendum)
 Occupational Therapy Evaluation Patient Details Name: [name] MRN: [id number] DOB: May 13, 1956 Today's Date: 03/04/2024   History of Present Illness   Pt is a 68 y.o female admitted 8/22 for SOB, and missed HD session.Chest x-Iman showed fluid overload. PMH: HFpEF, ESRD, HTN, CAD, DM     Clinical Impressions Pt admitted based on above, and was seen based on problem list below. PTA pt was reports receiving assistance from her grandchildren for ADLs, with a hx of falls. Today pt is requiring set up  to max assists for ADLs. Bed mobility was CGA with increased time and functional transfers are max assist with RW. Pt reporting memory deficits at baseline, but no family or caregiver present for baseline. Pt oriented not oriented to time, asking what month it is, difficulty with iniation, sequencing, and problem solving. Pt would benefit from <3 hours of skilled rehab daily. OT will continue to follow acutely to maximize functional independence.     If plan is discharge home, recommend the following:   A lot of help with walking and/or transfers;A lot of help with bathing/dressing/bathroom;Assistance with cooking/housework;Supervision due to cognitive status     Functional Status Assessment   Patient has had a recent decline in their functional status and demonstrates the ability to make significant improvements in function in a reasonable and predictable amount of time.     Equipment Recommendations   Other (comment) (Defer to next venue)      Precautions/Restrictions   Precautions Precautions: Fall Recall of Precautions/Restrictions: Impaired Restrictions Weight Bearing Restrictions Per Provider Order: No     Mobility Bed Mobility Overal bed mobility: Needs Assistance Bed Mobility: Supine to Sit, Sit to Supine     Supine to sit: HOB elevated, Used rails, Contact guard Sit to supine: Contact guard assist, HOB elevated, Used rails   General bed mobility comments:  Increased time, cueing for sequencing    Transfers Overall transfer level: Needs assistance Equipment used: Rolling walker (2 wheels) Transfers: Sit to/from Stand Sit to Stand: From elevated surface, Max assist           General transfer comment: Max assist from elevated surface to stand, once in standing CGA with RW for side steps along EOB      Balance Overall balance assessment: Needs assistance Sitting-balance support: No upper extremity supported, Feet supported Sitting balance-Leahy Scale: Fair     Standing balance support: Bilateral upper extremity supported Standing balance-Leahy Scale: Poor     ADL either performed or assessed with clinical judgement   ADL Overall ADL's : Needs assistance/impaired Eating/Feeding: Set up;Sitting   Grooming: Set up;Sitting   Upper Body Bathing: Set up;Sitting   Lower Body Bathing: Maximal assistance;Sit to/from stand   Upper Body Dressing : Set up;Sitting   Lower Body Dressing: Maximal assistance;Sit to/from stand   Toilet Transfer: Maximal assistance;Stand-pivot Toilet Transfer Details (indicate cue type and reason): Simulated in room Toileting- Clothing Manipulation and Hygiene: Maximal assistance;Sit to/from stand       Functional mobility during ADLs: Maximal assistance;Rolling walker (2 wheels) General ADL Comments: Decreased standing balance, difficulty reaching BLEs, cog creates safety risk. Difficulty following commands     Vision Baseline Vision/History: 4 Cataracts Patient Visual Report: Peripheral vision impairment Additional Comments: WFL for tasks assessed            Pertinent Vitals/Pain Pain Assessment Pain Assessment: Faces Faces Pain Scale: Hurts little more Pain Location: headache Pain Descriptors / Indicators: Aching     Extremity/Trunk Assessment Upper Extremity Assessment  Upper Extremity Assessment: Generalized weakness   Lower Extremity Assessment Lower Extremity Assessment: Defer to  PT evaluation       Communication Communication Communication: No apparent difficulties   Cognition Arousal: Alert Behavior During Therapy: WFL for tasks assessed/performed Cognition: No family/caregiver present to determine baseline, Cognition impaired   Orientation impairments: Time Awareness: Online awareness impaired Memory impairment (select all impairments): Short-term memory, Working [employer] fast streamer, Chiropractor functioning impairment (select all impairments): Initiation, Organization, Sequencing, Problem solving OT - Cognition Comments: Pt non reliable historian, reports memory deficits at baseline, no family present. Pt not oriented to month, difficulty following one step commands even with multimodal cues     Following commands: Impaired Following commands impaired: Follows one step commands with increased time     Cueing  General Comments   Cueing Techniques: Verbal cues;Tactile cues;Visual cues  VSS on 2L O2           Home Living Family/patient expects to be discharged to:: Private residence Living Arrangements: Other relatives (Plans to stay with sister) Available Help at Discharge: Family;Available 24 hours/day Type of Home: House Home Access: Stairs to enter [employer] of Steps: 1 (2 steps as soon as she enters house no rail.) Entrance Stairs-Rails: None Home Layout: Two level;Able to live on main level with bedroom/bathroom (half bath) Alternate Level Stairs-Number of Steps: 13 Alternate Level Stairs-Rails:  (unknown) Bathroom Shower/Tub: Tub/shower unit         Home Equipment: Cane - quad   Additional Comments: Info above is for pt's sister's house. Pt not reliable historian      Prior Functioning/Environment Prior Level of Function : Needs assist       Mobility Comments: Uses quad cane to ambulate. Several falls in the past 6 months ADLs Comments: Pt reporting grandchildren assist with LB ADLs    OT  Problem List: Decreased strength;Decreased range of motion;Decreased activity tolerance;Impaired balance (sitting and/or standing);Decreased cognition;Decreased safety awareness;Cardiopulmonary status limiting activity   OT Treatment/Interventions: Self-care/ADL training;Therapeutic exercise;Energy conservation;DME and/or AE instruction;Therapeutic activities;Patient/family education;Balance training      OT Goals(Current goals can be found in the care plan section)   Acute Rehab OT Goals Patient Stated Goal: To get better OT Goal Formulation: With patient Time For Goal Achievement: 03/18/24 Potential to Achieve Goals: Good   OT Frequency:  Min 2X/week       AM-PAC OT 6 Clicks Daily Activity     Outcome Measure Help from another person eating meals?: None Help from another person taking care of personal grooming?: A Little Help from another person toileting, which includes using toliet, bedpan, or urinal?: A Lot Help from another person bathing (including washing, rinsing, drying)?: A Lot Help from another person to put on and taking off regular upper body clothing?: A Little Help from another person to put on and taking off regular lower body clothing?: A Lot 6 Click Score: 16   End of Session Equipment Utilized During Treatment: Gait belt;Rolling walker (2 wheels);Oxygen  (2L) Nurse Communication: Mobility status  Activity Tolerance: Patient tolerated treatment well Patient left: in bed;with call bell/phone within reach;with bed alarm set  OT Visit Diagnosis: Unsteadiness on feet (R26.81);Other abnormalities of gait and mobility (R26.89);Repeated falls (R29.6);Muscle weakness (generalized) (M62.81)                Time: 8663-8587 OT Time Calculation (min): 36 min Charges:  OT General Charges $OT Visit: 1 Visit OT Evaluation $OT Eval Moderate Complexity: 1 Mod OT Treatments $Self Care/Home  Management : 8-22 mins  Adrianne BROCKS, OT  Acute Rehabilitation Services Office  [phone] Secure chat preferred   Adrianne GORMAN Savers 03/04/2024, 4:11 PM

## 2024-03-04 NOTE — Plan of Care (Signed)
 Confirmed that pt has only been taking ozempic for DM management at home

## 2024-03-04 NOTE — Progress Notes (Signed)
 Attempted to wean pt from 1 liter to room air pt desaturate 84-86% while awake and with good pleth. Pt placed back on 1 liter nasal cannula 94 %

## 2024-03-05 DIAGNOSIS — J9601 Acute respiratory failure with hypoxia: Secondary | ICD-10-CM | POA: Diagnosis not present

## 2024-03-05 LAB — RENAL FUNCTION PANEL
Albumin: 3 g/dL — ABNORMAL LOW (ref 3.5–5.0)
Anion gap: 11 (ref 5–15)
BUN: 29 mg/dL — ABNORMAL HIGH (ref 8–23)
CO2: 29 mmol/L (ref 22–32)
Calcium: 8.9 mg/dL (ref 8.9–10.3)
Chloride: 95 mmol/L — ABNORMAL LOW (ref 98–111)
Creatinine, Ser: 5.37 mg/dL — ABNORMAL HIGH (ref 0.44–1.00)
GFR, Estimated: 8 mL/min — ABNORMAL LOW (ref >60)
Glucose, Bld: 168 mg/dL — ABNORMAL HIGH (ref 70–99)
Phosphorus: 3.9 mg/dL (ref 2.5–4.6)
Potassium: 4.3 mmol/L (ref 3.5–5.1)
Sodium: 135 mmol/L (ref 135–145)

## 2024-03-05 LAB — MAGNESIUM: Magnesium: 1.9 mg/dL (ref 1.7–2.4)

## 2024-03-05 LAB — CBC
HCT: 25.7 % — ABNORMAL LOW (ref 36.0–46.0)
Hemoglobin: 7.9 g/dL — ABNORMAL LOW (ref 12.0–15.0)
MCH: 26.4 pg (ref 26.0–34.0)
MCHC: 30.7 g/dL (ref 30.0–36.0)
MCV: 86 fL (ref 80.0–100.0)
Platelets: 141 K/uL — ABNORMAL LOW (ref 150–400)
RBC: 2.99 MIL/uL — ABNORMAL LOW (ref 3.87–5.11)
RDW: 19 % — ABNORMAL HIGH (ref 11.5–15.5)
WBC: 7.3 K/uL (ref 4.0–10.5)
nRBC: 0 % (ref 0.0–0.2)

## 2024-03-05 LAB — GLUCOSE, CAPILLARY
Glucose-Capillary: 146 mg/dL — ABNORMAL HIGH (ref 70–99)
Glucose-Capillary: 152 mg/dL — ABNORMAL HIGH (ref 70–99)
Glucose-Capillary: 176 mg/dL — ABNORMAL HIGH (ref 70–99)
Glucose-Capillary: 123 mg/dL — ABNORMAL HIGH (ref 70–99)

## 2024-03-05 LAB — HEMOGLOBIN AND HEMATOCRIT, BLOOD
HCT: 30.4 % — ABNORMAL LOW (ref 36.0–46.0)
Hemoglobin: 9.5 g/dL — ABNORMAL LOW (ref 12.0–15.0)

## 2024-03-05 MED ORDER — LIDOCAINE-PRILOCAINE 2.5-2.5 % EX CREA: TOPICAL_CREAM | Freq: Three times a day (TID) | CUTANEOUS | Status: DC | PRN

## 2024-03-05 MED ORDER — TORSEMIDE 20 MG PO TABS
100.0000 mg | ORAL_TABLET | Freq: Once | ORAL | Status: AC
  Administered 2024-03-05: 100 mg via ORAL
  Filled 2024-03-05: qty 5

## 2024-03-05 MED ORDER — AMLODIPINE BESYLATE 5 MG PO TABS
5.0000 mg | ORAL_TABLET | Freq: Every day | ORAL | Status: DC
  Administered 2024-03-05 – 2024-03-08 (×4): 5 mg via ORAL
  Filled 2024-03-05 (×4): qty 1

## 2024-03-05 MED ORDER — MAGNESIUM SULFATE IN D5W 1-5 GM/100ML-% IV SOLN
1.0000 g | Freq: Once | INTRAVENOUS | Status: AC
  Administered 2024-03-05: 1 g via INTRAVENOUS
  Filled 2024-03-05: qty 100

## 2024-03-05 MED ORDER — CARVEDILOL 12.5 MG PO TABS
12.5000 mg | ORAL_TABLET | Freq: Two times a day (BID) | ORAL | Status: DC
  Administered 2024-03-05 – 2024-03-08 (×5): 12.5 mg via ORAL
  Filled 2024-03-05 (×5): qty 1

## 2024-03-05 MED ORDER — BUPRENORPHINE 10 MCG/HR TD PTWK
2.0000 | MEDICATED_PATCH | TRANSDERMAL | Status: DC
  Administered 2024-03-05: 2 via TRANSDERMAL
  Filled 2024-03-05: qty 2

## 2024-03-05 MED ORDER — GABAPENTIN 100 MG PO CAPS: 200.0000 mg | ORAL_CAPSULE | Freq: Once | ORAL | Status: DC

## 2024-03-05 NOTE — Progress Notes (Signed)
   03/05/24 2010  BiPAP/CPAP/SIPAP  BiPAP/CPAP/SIPAP Pt Type Adult  Reason BIPAP/CPAP not in use Non-compliant  BiPAP/CPAP /SiPAP Vitals  Pulse Rate 85  Resp 18  SpO2 100 %  Bilateral Breath Sounds Clear

## 2024-03-05 NOTE — Assessment & Plan Note (Addendum)
-   Consult nephrology, appreciate recs  - Last inpatient HD Friday, 08/22  - Still overloaded, HD again 08/23  - Torsemide  20mg  on non-HD days - AM RFP on dialysis days - Gabapentin  200 mg following dialysis on HD days - Establish outpatient HD appointments for SNF d/c

## 2024-03-05 NOTE — Assessment & Plan Note (Addendum)
 ESRD: continue T/Th/Sat dialysis CAD: s/p PCI, continue aspirin  HTN: continue midodrine , carvedilol , amlodipine  HLD: continue atorvastatin  T2DM: holding ozempic, A1c 7.0, monitor CBGs add insulin  if necessary HFpEF: holding meds as above.  Chronic pain/neuropathy: Receives buprenorphine  patch, oxycodone  10 mg every 6 hours as needed, gabapentin  200 mg daily following dialysis on dialysis days

## 2024-03-05 NOTE — Progress Notes (Signed)
 [facility] Progress Note   Subjective: Seen in room. Completed dialysis yesterday with another 4L removed. Breathing is better. Trying to wean off O2. Says she has moved to [facility] and will need to go to a dialysis center here.   Objective Vitals:   03/04/24 1951 03/04/24 2312 03/05/24 0331 03/05/24 0831  BP: (!) 134/51 (!) 156/60 (!) 144/54 (!) 144/60  Pulse: 94 88 88 79  Resp: 20 20 20 20   Temp: 98.3 F (36.8 C) 98.5 F (36.9 C) 98.3 F (36.8 C) 98.3 F (36.8 C)  TempSrc: Oral Oral Oral Oral  SpO2: 94% 95% 94% 96%  Weight:      Height:         Additional Objective Labs: Basic Metabolic Panel: Recent Labs  Lab 03/03/24 0916 03/03/24 1140 03/03/24 1336 03/04/24 0345 03/05/24 0403  NA 141 136  --  136 135  K 5.7* 5.3*  --  4.4 4.3  CL 101  --   --  98 95*  CO2 22  --   --  29 29  GLUCOSE 232*  --   --  155* 168*  BUN 53*  --   --  36* 29*  CREATININE 9.26*  --  9.61* 6.90* 5.37*  CALCIUM  8.6*  --   --  8.8* 8.9  PHOS  --   --   --  5.3* 3.9   CBC: Recent Labs  Lab 03/03/24 1015 03/03/24 1140 03/03/24 1336 03/05/24 0403  WBC 9.8  --  9.5 7.3  NEUTROABS 8.7*  --   --   --   HGB 8.1* 8.5* 8.3* 7.9*  HCT 26.0* 25.0* 26.8* 25.7*  MCV 85.8  --  87.0 86.0  PLT 92*  --  98* 141*   Blood Culture    Component Value Date/Time   SDES BLOOD SITE NOT SPECIFIED 11/15/2022 0949   SPECREQUEST  11/15/2022 0949    BOTTLES DRAWN AEROBIC ONLY Blood Culture results may not be optimal due to an inadequate volume of blood received in culture bottles   CULT  11/15/2022 0949    NO GROWTH 5 DAYS Performed at [facility], [address], [place], [place] [address]    REPTSTATUS 11/20/2022 FINAL 11/15/2022 9050     Physical Exam General: Alert, nad, on nasal oxygen   Heart: RRR Lungs: coarse breath sounds   Abdomen: non tender Extremities: 1+LE edema  Dialysis Access: L AVF +bruit   Medications:   aspirin  EC  81 mg Oral Daily    atorvastatin   80 mg Oral Daily   fluticasone  furoate-vilanterol  1 puff Inhalation Daily   heparin   5,000 Units Subcutaneous Q8H   insulin  aspart  0-6 Units Subcutaneous TID WC   midodrine   10 mg Oral Q T,Th,Sat-1800    Dialysis Orders: [facility] TTS 4.5h  100.8kg   B450    L AVF   Heparin  3000 (from CE) Cinacalcet  60 three times per week Hectorol  5 mcg three times per week Mircera 75 q 2 wks (last 02/29/24)   Assessment/Plan: Acute hypoxic respiratory failure:  severe bilat pulm edema by CXR. Likely this is due to vol overload d/t missed HD +/- other. Had dialysis here Friday and Saturday. 9L removed. Continue volume removal with HD. Trying to wean off O2.  ESRD: Usual HD TTS. Has moved to [facility] and needs CLIP to clinic here. Will notify renal navigator on Monday.  HTN. Coreg  on hold for low Bps. Takes midodrine  with HD -  10 mg pre HD.  Volume: As above. Volume overload on admit. Now near EDW. Torsemide  on non HD days - will order here. UF as able  Anemia: Hgb 7-9. On ESA as outpatient. Recently dosed. Follow trends. Transfuse prn  DM2: Insulin  per primary   Heather Ronnald Acosta PA-C [facility] 03/05/2024,10:44 AM

## 2024-03-05 NOTE — Progress Notes (Addendum)
 FMTS ATTENDING ADMISSION NOTE Heather Miera,MD I  have seen and examined this patient, reviewed their chart. I have discussed this patient with the resident.   She is much improved. She wanted to talk to a Child psychotherapist regarding her discharge disposition as she came from out of town, although her daughter wants her to be relocated back to [place]. She currently does not have a dialysis center set up here in [place].  Exam: Gen: Ill-appearing with moderate respiratory distress Heart: S1 S2 normal, 3/6 systolic murmur, most audible at the left sternal border. RRR Lungs: Air entry equal B/L, no wheezing Abd: Soft, NT/ND, normoactive BS.  A/P: Acute Hypoxic Respiratory Failure: Acute diastolic CHF exacerbation In the setting of missed dialysis Her chest X-Pavlov shows new interstitial pulmonary edema consistent with CHF ECHO shows an EF of 65-70% with GIDD and moderate to severe Aortic stenosis Now saturating 95% on RA S/P multiple dialysis sessions with resumption of her home Torsemide  regimen. Given Aortic stenosis severity and her CHF exacerbation, need to discuss surgical treatment option with CTS at some point Monitor I/Os closely.  Anemia of chronic disease: Likely due to ESRD Hgb dropped to 7.9 Blood transfusion threshold of <8 in the settings of CAD Repeat hemoglobin at 4 pm today Discuss EPO vs PRBC transfusion need with nephrology, given her ESRD status with CHF to avoid fluid overload. Will coordinate with nephrology if a transfusion is needed   Disposition: PT/OT recommended SNF discharge. Resident to discuss with the patient and her family. Consult the social worker with her discharge disposition. She seems to be close to her baseline goal.

## 2024-03-05 NOTE — Progress Notes (Signed)
 Daily Progress Note Intern Pager: [phone]  Patient name: [name] Medical record number: [id number] Date of birth: 1955/12/13 Age: 68 y.o. Gender: female  Primary Care Provider: Elizbeth Leita Ruth, FNP Consultants: Nephrology Code Status: Full  Pt Overview and Major Events to Date:  8/22: Admitted for acute hypoxic respiratory failure 2/2 pulmonary edema   Assessment and Plan: [name] is a 68 y.o. F admitted for acute hypoxic respiratory failure with SOB and chest tightness. This is likely 2/2 pulmonary edema multifactorial in setting of ESRD and missed dialysis, diuretic nonadherence with known HFpEF.  Assessment & Plan Acute hypoxic respiratory failure (HCC) 2/2 volume overload. Currently on 0.5L LFNC, baseline no oxygen . Improving today. - Continue home Breo Ellipta , DuoNebs every 4 hours as needed - Wean O2 as tolerated - Echo EF 65-70% ESRD (end stage renal disease) on dialysis [facility]) - Consult nephrology, appreciate recs  - Last inpatient HD Friday, 08/22  - Still overloaded, HD again 08/23  - Torsemide  20mg  on non-HD days - AM RFP on dialysis days - Gabapentin  200 mg following dialysis on HD days - Establish outpatient HD appointments for SNF d/c Chronic health problem ESRD: continue T/Th/Sat dialysis CAD: s/p PCI, continue aspirin  HTN: continue midodrine , carvedilol , amlodipine  HLD: continue atorvastatin  T2DM: holding ozempic, A1c 7.0, monitor CBGs add insulin  if necessary HFpEF: holding meds as above.  Chronic pain/neuropathy: Receives buprenorphine  patch, oxycodone  10 mg every 6 hours as needed, gabapentin  200 mg daily following dialysis on dialysis days  FEN/GI: Renal diet PPx: Heparin  Dispo:SNF pending outpatient HD scheduling  Subjective:  Patient reports to be doing well today.  We discussed the possibility of needing 1 unit of blood transfused, patient says she needs to discuss this with someone., as she has studied with Jehovah's  witnesses.  She is denying shortness of breath or chest pain.  Will follow-up with TOC regarding patient's outpatient hemodialysis sessions.  No other complaints at this time.  Objective: Temp:  [98 F (36.7 C)-98.6 F (37 C)] 98.3 F (36.8 C) (08/24 0331) Pulse Rate:  [80-94] 88 (08/24 0331) Resp:  [13-20] 20 (08/24 0331) BP: (123-156)/(48-77) 144/54 (08/24 0331) SpO2:  [93 %-100 %] 94 % (08/24 0331) Weight:  [102.8 kg-106.8 kg] 102.8 kg (08/23 1918) Physical Exam: General: Sitting upright in bed, NAD Cardiovascular: RRR Respiratory: CTAB, normal work of breathing on 0.5 L O2 via [place] Abdomen: Soft, mild distention, nontender Extremities: Swollen, nonpitting edema bilateral lower extremities  Laboratory: Most recent CBC Lab Results  Component Value Date   WBC 7.3 03/05/2024   HGB 7.9 (L) 03/05/2024   HCT 25.7 (L) 03/05/2024   MCV 86.0 03/05/2024   PLT 141 (L) 03/05/2024   Most recent BMP    Latest Ref Rng & Units 03/05/2024    4:03 AM  BMP  Glucose 70 - 99 mg/dL 831   BUN 8 - 23 mg/dL 29   Creatinine 9.55 - 1.00 mg/dL 4.62   Sodium 864 - 854 mmol/L 135   Potassium 3.5 - 5.1 mmol/L 4.3   Chloride 98 - 111 mmol/L 95   CO2 22 - 32 mmol/L 29   Calcium  8.9 - 10.3 mg/dL 8.9    Echo: Radiologist impression: 1. Left ventricular ejection fraction, by estimation, is 65 to 70% . Left ventricular ejection fraction by PLAX is 67 % . The left ventricle has normal function. The left ventricle has no regional wall motion abnormalities. There is mild left ventricular hypertrophy. Left ventricular diastolic parameters  are consistent with Grade I diastolic dysfunction ( impaired relaxation) . Elevated left ventricular end- diastolic pressure. The E/ e' is 22.  2. Right ventricular systolic function is normal. The right ventricular size is normal. Tricuspid regurgitation signal is inadequate for assessing PA pressure.  3. Left atrial size was moderately dilated.  4. The mitral valve is  degenerative. Trivial mitral valve regurgitation. Moderate mitral stenosis. The mean mitral valve gradient is 8. 0 mmHg with average heart rate of 85 bpm. Moderate to severe mitral annular calcification. MVA 1. 47 cm2 by VTI.  5. The aortic valve is tricuspid. There is moderate calcification of the aortic valve. Aortic valve regurgitation is mild. Moderate to severe aortic valve stenosis. Aortic valve area, by VTI measures 1. 03 cm . Aortic valve mean gradient measures 26. 0 mmHg. Aortic valve Vmax measures 3. 31 m/ s. Peak gradient 43. 8 mmHg, DI 0. 36.  6. The inferior vena cava is dilated in size with < 50% respiratory variability, suggesting right atrial pressure of 15 mmHg.  Idelle Nakai, DO 03/05/2024, 8:10 AM  PGY-1, [facility] FPTS Intern pager: [phone], text pages welcome Secure chat group [facility] Teaching Service

## 2024-03-05 NOTE — Assessment & Plan Note (Addendum)
 2/2 volume overload. Currently on 0.5L LFNC, baseline no oxygen . Improving today. - Continue home Breo Ellipta , DuoNebs every 4 hours as needed - Wean O2 as tolerated - Echo EF 65-70%

## 2024-03-06 DIAGNOSIS — J810 Acute pulmonary edema: Secondary | ICD-10-CM | POA: Diagnosis not present

## 2024-03-06 DIAGNOSIS — J9601 Acute respiratory failure with hypoxia: Secondary | ICD-10-CM | POA: Diagnosis not present

## 2024-03-06 LAB — RENAL FUNCTION PANEL
Albumin: 2.8 g/dL — ABNORMAL LOW (ref 3.5–5.0)
Anion gap: 11 (ref 5–15)
BUN: 40 mg/dL — ABNORMAL HIGH (ref 8–23)
CO2: 27 mmol/L (ref 22–32)
Calcium: 9.1 mg/dL (ref 8.9–10.3)
Chloride: 95 mmol/L — ABNORMAL LOW (ref 98–111)
Creatinine, Ser: 6.91 mg/dL — ABNORMAL HIGH (ref 0.44–1.00)
GFR, Estimated: 6 mL/min — ABNORMAL LOW (ref >60)
Glucose, Bld: 115 mg/dL — ABNORMAL HIGH (ref 70–99)
Phosphorus: 5.2 mg/dL — ABNORMAL HIGH (ref 2.5–4.6)
Potassium: 4.3 mmol/L (ref 3.5–5.1)
Sodium: 133 mmol/L — ABNORMAL LOW (ref 135–145)

## 2024-03-06 LAB — GLUCOSE, CAPILLARY
Glucose-Capillary: 110 mg/dL — ABNORMAL HIGH (ref 70–99)
Glucose-Capillary: 151 mg/dL — ABNORMAL HIGH (ref 70–99)
Glucose-Capillary: 150 mg/dL — ABNORMAL HIGH (ref 70–99)
Glucose-Capillary: 129 mg/dL — ABNORMAL HIGH (ref 70–99)
Glucose-Capillary: 143 mg/dL — ABNORMAL HIGH (ref 70–99)

## 2024-03-06 LAB — CBC
HCT: 23.9 % — ABNORMAL LOW (ref 36.0–46.0)
HCT: 28.5 % — ABNORMAL LOW (ref 36.0–46.0)
Hemoglobin: 7.5 g/dL — ABNORMAL LOW (ref 12.0–15.0)
Hemoglobin: 8.9 g/dL — ABNORMAL LOW (ref 12.0–15.0)
MCH: 26.7 pg (ref 26.0–34.0)
MCH: 26.6 pg (ref 26.0–34.0)
MCHC: 31.4 g/dL (ref 30.0–36.0)
MCHC: 31.2 g/dL (ref 30.0–36.0)
MCV: 85.1 fL (ref 80.0–100.0)
MCV: 85.1 fL (ref 80.0–100.0)
Platelets: 140 K/uL — ABNORMAL LOW (ref 150–400)
Platelets: 148 K/uL — ABNORMAL LOW (ref 150–400)
RBC: 2.81 MIL/uL — ABNORMAL LOW (ref 3.87–5.11)
RBC: 3.35 MIL/uL — ABNORMAL LOW (ref 3.87–5.11)
RDW: 18.4 % — ABNORMAL HIGH (ref 11.5–15.5)
RDW: 18.6 % — ABNORMAL HIGH (ref 11.5–15.5)
WBC: 5 K/uL (ref 4.0–10.5)
WBC: 4.9 K/uL (ref 4.0–10.5)
nRBC: 0 % (ref 0.0–0.2)
nRBC: 0 % (ref 0.0–0.2)

## 2024-03-06 MED ORDER — TORSEMIDE 20 MG PO TABS
100.0000 mg | ORAL_TABLET | ORAL | Status: DC
  Administered 2024-03-08: 100 mg via ORAL
  Filled 2024-03-06 (×2): qty 5

## 2024-03-06 MED ORDER — CHLORHEXIDINE GLUCONATE CLOTH 2 % EX PADS
6.0000 | MEDICATED_PAD | Freq: Every day | CUTANEOUS | Status: DC
  Administered 2024-03-06: 6 via TOPICAL

## 2024-03-06 MED ORDER — GABAPENTIN 100 MG PO CAPS
200.0000 mg | ORAL_CAPSULE | ORAL | Status: DC
  Administered 2024-03-07: 200 mg via ORAL
  Filled 2024-03-06: qty 2

## 2024-03-06 NOTE — H&P (Incomplete)
 Hospital Admission History and Physical Service Pager: [phone]  Patient name: [name] Medical record number: [id number] Date of Birth: 1956/01/26 Age: 68 y.o. Gender: female  Primary Care Provider: Elizbeth Leita Ruth, FNP Consultants: *** Code Status: *** which was confirmed with family if patient unable to confirm ***  Preferred Emergency Contact: ***  Chief Complaint: ***  Assessment and Plan: [name] is a 68 y.o. female presenting with *** . Differential for presentation of this includes ***.   Assessment & Plan Acute hypoxic respiratory failure (HCC) Resolved. Now on RA.  - Continue home Breo Ellipta , DuoNebs every 4 hours as needed ESRD (end stage renal disease) on dialysis [facility]) Missed dialysis initially and now s/p 2 HD sessions this admission. Resuming TTS HD. Will have TOC discuss with patient about living situation. Renal navigator notified to CLIP patient in [place]. - nephrology following, appreciate recs - HD TTS, RFP - torsemide  100 mg given yesterday and written in note for nonHD days, but only ordered one time. Will await neph notes today for resuming home dose of 10 mg vs new 100 mg dose.   Anemia due to end stage renal disease (HCC) Hgb 7.5 this morning. CAD, so transfusion threshold 8. On ESA outpatient.  - Transfuse 1 unit pRBC if patient agreeable - post-transfusion H&H - AM CBC Chronic health problem ESRD: continue T/Th/Sat dialysis CAD: s/p PCI, continue aspirin  HTN: continue midodrine , carvedilol , amlodipine  HLD: continue atorvastatin  T2DM: holding ozempic, A1c 7.0, monitor CBGs add insulin  if necessary HFpEF: echo now with EF 65-70%. Outpatient cardiology follow up for aortic stenosis. Chronic pain/neuropathy: Receives buprenorphine  patch, oxycodone  10 mg every 6 hours as needed, gabapentin  200 mg TTS after dialysis.   Chronic and Stable Problems: ***   FEN/GI: *** VTE Prophylaxis: ***  Disposition: ***  History of Present  Illness:  [name] is a 68 y.o. female presenting with ***    In the ED, ***  Review Of Systems: Per HPI with the following additions: ***  Pertinent Past Medical History: *** Remainder reviewed in history tab.   Pertinent Past Surgical History: ***  Remainder reviewed in history tab.  Pertinent Social History: Tobacco use: Yes/No/Former Alcohol use: *** Other Substance use: *** Lives with ***  Pertinent Family History: ***  Remainder reviewed in history tab.   Important Outpatient Medications: *** Remainder reviewed in medication history.   Objective: BP (!) 136/57 (BP Location: Right Arm)   Pulse 73   Temp 98.2 F (36.8 C) (Oral)   Resp 20   Ht 5' 5 (1.651 m)   Wt 102.8 kg   SpO2 97%   BMI 37.71 kg/m  Exam: General: *** Eyes: *** ENTM: *** Neck: *** Cardiovascular: *** Respiratory: *** Gastrointestinal: *** MSK: *** Derm: *** Neuro: *** Psych: ***  Labs:  CBC BMET  Recent Labs  Lab 03/06/24 1130  WBC 4.9  HGB 8.9*  HCT 28.5*  PLT 148*   Recent Labs  Lab 03/06/24 0332  NA 133*  K 4.3  CL 95*  CO2 27  BUN 40*  CREATININE 6.91*  GLUCOSE 115*  CALCIUM  9.1    Pertinent additional labs ***.  EKG: My own interpretation (not copied from electronic read) ***    Imaging Studies Performed:  Imaging Study (ie. Chest x-Petrides) Impression from Radiologist: ***   My Interpretation: ***   Jerrie Gathers, DO 03/06/2024, 11:20 PM PGY-***, [facility]  FPTS Intern pager: [phone], text pages welcome Secure chat  group [facility] Teaching Service

## 2024-03-06 NOTE — Progress Notes (Signed)
 Contacted by renal PA regarding pt's need for new out-pt HD clinic in the [place] area at d/c. Contacted CSW to inquire about pt's d/c plan at d/c. Once pt's d/c plan is confirmed, navigator will submit referral for most appropriate out-pt HD clinic based on pt's d/c plan. Will assist as needed.   Randine Mungo Dialysis Navigator [phone]

## 2024-03-06 NOTE — Progress Notes (Addendum)
 Daily Progress Note Intern Pager: [phone]  Patient name: [name] Medical record number: [id number] Date of birth: Jan 24, 1956 Age: 68 y.o. Gender: female  Primary Care Provider: Elizbeth Leita Ruth, FNP Consultants: nephrology Code Status: full  Pt Overview and Major Events to Date:  8/22 - admitted, HD 8/23 - HD   Assessment and Plan:  This is a 68 yo with resolved AHRF secondary to volume overload from missed dialysis. Stage C1 Aortic Stenosis, not symptomatic currently. More likely her initial respiratory distress was due to volume overload as it has resolved with appropriate HD. Pertinent PMH/PSH includes ESRD, anemia, HTN.  Assessment & Plan Acute hypoxic respiratory failure (HCC) Resolved. Now on RA.  - Continue home Breo Ellipta , DuoNebs every 4 hours as needed ESRD (end stage renal disease) on dialysis [facility]) Missed dialysis initially and now s/p 2 HD sessions this admission. Resuming TTS HD. Will have TOC discuss with patient about living situation. Renal navigator notified to CLIP patient in [place]. - nephrology following, appreciate recs - HD TTS, RFP - torsemide  100 mg given yesterday and written in note for nonHD days, but only ordered one time. Will await neph notes today for resuming home dose of 10 mg vs new 100 mg dose.   Anemia due to end stage renal disease (HCC) Hgb 7.5 this morning. CAD, so transfusion threshold 8. On ESA outpatient.  - Transfuse 1 unit pRBC if patient agreeable - post-transfusion H&H - AM CBC Chronic health problem ESRD: continue T/Th/Sat dialysis CAD: s/p PCI, continue aspirin  HTN: continue midodrine , carvedilol , amlodipine  HLD: continue atorvastatin  T2DM: holding ozempic, A1c 7.0, monitor CBGs add insulin  if necessary HFpEF: echo now with EF 65-70%. Outpatient cardiology follow up for aortic stenosis. Chronic pain/neuropathy: Receives buprenorphine  patch, oxycodone  10 mg every 6 hours as needed, gabapentin  200 mg TTS after  dialysis.   FEN/GI: renal PPx: [place] heparin  Dispo:Home pending clinical improvement .   Subjective:  She reports improvement in her SOB. Primary complaint is neuropathic pain. Taking OP lyrica  100 mg daily. She says she is very somnolent in the outpatient setting from percocet, lyrica  daily, and hydroxyzine .    Objective: Temp:  [98 F (36.7 C)-98.4 F (36.9 C)] 98.2 F (36.8 C) (08/25 0530) Pulse Rate:  [77-88] 82 (08/25 0530) Resp:  [16-20] 20 (08/24 2322) BP: (130-165)/(46-62) 133/46 (08/25 0530) SpO2:  [91 %-100 %] 95 % (08/25 0530)   Intake/Output Summary (Last 24 hours) at 03/06/2024 0737 Last data filed at 03/05/2024 1935 Gross per 24 hour  Intake 280 ml  Output --  Net 280 ml   Physical Exam: General: sitting up in bed, well appearing Cardiovascular: RRR, holosystolic murmur Respiratory: CTAB, no crackles or wheezing Extremities: no peripheral edema  Laboratory: Most recent CBC Lab Results  Component Value Date   WBC 5.0 03/06/2024   HGB 7.5 (L) 03/06/2024   HCT 23.9 (L) 03/06/2024   MCV 85.1 03/06/2024   PLT 140 (L) 03/06/2024   Most recent BMP    Latest Ref Rng & Units 03/06/2024    3:32 AM  BMP  Glucose 70 - 99 mg/dL 884   BUN 8 - 23 mg/dL 40   Creatinine 9.55 - 1.00 mg/dL 3.08   Sodium 864 - 854 mmol/L 133   Potassium 3.5 - 5.1 mmol/L 4.3   Chloride 98 - 111 mmol/L 95   CO2 22 - 32 mmol/L 27   Calcium  8.9 - 10.3 mg/dL 9.1    Imaging/Diagnostic Tests: None  Alena  Napoleon Corrente, MD 03/06/2024, 7:27 AM  PGY-1, [facility] Family Medicine FPTS Intern pager: [phone], text pages welcome Secure chat group [facility] Teaching Service

## 2024-03-06 NOTE — Assessment & Plan Note (Addendum)
 Missed dialysis initially and now s/p 2 HD sessions this admission. Resuming TTS HD. Will have TOC discuss with patient about living situation. Renal navigator notified to CLIP patient in [place]. - nephrology following, appreciate recs - HD TTS, RFP - torsemide  100 mg given yesterday and written in note for nonHD days, but only ordered one time. Will await neph notes today for resuming home dose of 10 mg vs new 100 mg dose.

## 2024-03-06 NOTE — Progress Notes (Signed)
 Physical Therapy Treatment Patient Details Name: [name] MRN: [id number] DOB: [birth year] Today's Date: 03/06/2024   History of Present Illness Pt is a 68 y.o female admitted 8/22 for SOB, and missed HD session.Chest x-Holts showed fluid overload. PMH: HFpEF, ESRD, HTN, CAD, DM    PT Comments  Good progress since initial evaluation. Able to mobilize out of bed and ambulate restroom and around room with RW without overt LOB or buckling at a CGA to supervision level today.Complains of BIl foot pain due to her neuropathy - plans to have family drop off her shoes. Feel that with adequate support at home from her sister HHPT would be appropriate after d/c. Goals update based on progress. Will follow acutely to ensure she is progressing. Patient will continue to benefit from skilled physical therapy services to further improve independence with functional mobility.     If plan is discharge home, recommend the following: Help with stairs or ramp for entrance;Assist for transportation   Can travel by private vehicle     Yes  Equipment Recommendations  Rolling walker (2 wheels) (may progress to rollator, TBD)    Recommendations for Other Services       Precautions / Restrictions Precautions Precautions: Fall Recall of Precautions/Restrictions: Impaired Restrictions Weight Bearing Restrictions Per Provider Order: No     Mobility  Bed Mobility Overal bed mobility: Needs Assistance Bed Mobility: Supine to Sit     Supine to sit: Supervision, HOB elevated     General bed mobility comments: supervision for safety, extra time with HOB elevated, no assist today.    Transfers Overall transfer level: Needs assistance Equipment used: Rolling walker (2 wheels) Transfers: Sit to/from Stand Sit to Stand: Contact guard assist           General transfer comment: CGA for safety from bed, slow and effortful, mod I from toilet with rail. RW to steady.    Ambulation/Gait Ambulation/Gait  assistance: Supervision Gait Distance (Feet): 15 Feet (+20) Assistive device: Rolling walker (2 wheels) Gait Pattern/deviations: Step-through pattern, Decreased stride length Gait velocity: dec Gait velocity interpretation: <1.31 ft/sec, indicative of household ambulator   General Gait Details: Slower speed but stable with RW to support herself. No overt Buckling or LOB with short distance. VSS throughout on RA. Bathroom urgency. Feet sore, wants her shoes from home. Educated on safety and awareness.   Stairs             Wheelchair Mobility     Tilt Bed    Modified Rankin (Stroke Patients Only)       Balance Overall balance assessment: Needs assistance Sitting-balance support: No upper extremity supported, Feet supported Sitting balance-Leahy Scale: Good     Standing balance support: Single extremity supported, During functional activity Standing balance-Leahy Scale: Poor                              Communication Communication Communication: No apparent difficulties  Cognition Arousal: Alert Behavior During Therapy: WFL for tasks assessed/performed   PT - Cognitive impairments: No family/caregiver present to determine baseline, Initiation   Orientation impairments:  (Extra time to process initially, improved with activity)                     Following commands: Impaired Following commands impaired: Follows one step commands with increased time, Only follows one step commands consistently    Cueing Cueing Techniques: Verbal cues, Gestural cues  Exercises  General Comments        Pertinent Vitals/Pain Pain Assessment Pain Assessment: No/denies pain    Home Living                          Prior Function            PT Goals (current goals can now be found in the care plan section) Acute Rehab PT Goals Patient Stated Goal: Get well PT Goal Formulation: With patient Time For Goal Achievement: 03/17/24 Potential  to Achieve Goals: Good Progress towards PT goals: Progressing toward goals    Frequency    Min 2X/week      PT Plan      Co-evaluation              AM-PAC PT 6 Clicks Mobility   Outcome Measure  Help needed turning from your back to your side while in a flat bed without using bedrails?: A Little Help needed moving from lying on your back to sitting on the side of a flat bed without using bedrails?: A Little Help needed moving to and from a bed to a chair (including a wheelchair)?: A Little Help needed standing up from a chair using your arms (e.g., wheelchair or bedside chair)?: A Little Help needed to walk in hospital room?: A Little Help needed climbing 3-5 steps with a railing? : A Lot 6 Click Score: 17    End of Session Equipment Utilized During Treatment: Gait belt Activity Tolerance: Patient tolerated treatment well Patient left: in chair;with call bell/phone within reach;with chair alarm set   PT Visit Diagnosis: Unsteadiness on feet (R26.81);Other abnormalities of gait and mobility (R26.89);Difficulty in walking, not elsewhere classified (R26.2);Muscle weakness (generalized) (M62.81);History of falling (Z91.81);Pain     Time: 1027-1100 PT Time Calculation (min) (ACUTE ONLY): 33 min  Charges:    $Gait Training: 8-22 mins $Therapeutic Activity: 8-22 mins PT General Charges $$ ACUTE PT VISIT: 1 Visit                     Leontine Roads, PT, DPT [facility]  Rehabilitation Services Physical Therapist Office: [phone] Website: [email or web address]    Leontine GORMAN Roads 03/06/2024, 1:11 PM

## 2024-03-06 NOTE — TOC Progression Note (Signed)
 Transition of Care [facility]) - Progression Note    Patient Details  Name: [name] MRN: [id number] Date of Birth: 1956-04-25  [facility]) CM/SW Contact  [name], [place] Phone Number: 03/06/2024, 4:14 PM  Clinical Narrative:     Patient states she is here visiting with her daughter and grandchildren. She is currently with her sister at [address] in [place]. She became tearful and states she wants to return home ([place], [place]) but she was not sure at this time. CSW informed patient disposition is needed to ensure a plan is in place for dialysis. Patient states understanding and will inform CSW tomorrow morning.   TOC will continue to follow and assist with discharge planning.  Montie Louder, MSW, LCSW Clinical Social Worker                      Expected Discharge Plan and Services                                               Social Drivers of Health (SDOH) Interventions SDOH Screenings   Food Insecurity: No Food Insecurity (03/04/2024)  Housing: Unknown (03/04/2024)  Transportation Needs: No Transportation Needs (03/04/2024)  Utilities: Not At Risk (03/04/2024)  Alcohol Screen: Low Risk  (03/03/2021)  Depression (PHQ2-9): High Risk (03/03/2021)  Financial Resource Strain: Low Risk  (12/01/2023)   Received from [facility]  Physical Activity: Inactive (12/01/2023)   Received from [facility]  Social Connections: Unknown (03/04/2024)  Stress: No Stress Concern Present (12/01/2023)   Received from [facility]  Tobacco Use: Medium Risk (03/03/2024)  Health Literacy: Adequate Health Literacy (12/01/2023)   Received from [facility]    Readmission Risk Interventions    11/01/2022    9:36 AM  Readmission Risk Prevention Plan  Transportation Screening Complete  PCP or Specialist Appt within 3-5 Days Complete  HRI or Home Care Consult Complete   Social Work Consult for Recovery Care Planning/Counseling Complete  Palliative Care Screening Not Applicable  Medication Review Oceanographer) Complete

## 2024-03-06 NOTE — Assessment & Plan Note (Addendum)
 Hgb 7.5 this morning. CAD, so transfusion threshold 8. On ESA outpatient.  - Transfuse 1 unit pRBC if patient agreeable - post-transfusion H&H - AM CBC

## 2024-03-06 NOTE — Plan of Care (Signed)
 I spoke with the patient at bedside to discuss our medical recommendation for blood transfusion (hgb 7.5, CAD so transfusion threshold <8). Currently awaiting iron  studies and receiving ESA for anemia with ESRD. The patient studies with jehova's witnesses and wants to reach out to them for recs about blood transfusion. Additionally, I talked with the patient about PT/OT recs for SNF. She will consider this. I attempted to call the patient's daughter to update her. Was unable to reach her. I called the patient's sister, with whom she will be living with after discharge/SNF. Sister is very supportive. Questions answered appropriately. Will work with TOC/nephrology coordinator for dispo planning/CLIP once patient has made a decision about SNF.   Dr. [facility]

## 2024-03-06 NOTE — Progress Notes (Signed)
 [name] Progress Note   Subjective: Seen in room. Comfortable on room air. Denies chest pain, sob. Needs CLIP to HD center in [place].   Objective Vitals:   03/05/24 2010 03/05/24 2322 03/06/24 0530 03/06/24 0819  BP:  (!) 140/50 (!) 133/46 137/60  Pulse: 85 84 82 82  Resp: 18 20  18   Temp:  98.2 F (36.8 C) 98.2 F (36.8 C) 97.8 F (36.6 C)  TempSrc:  Oral Oral Oral  SpO2: 100% 91% 95% 94%  Weight:      Height:         Additional Objective Labs: Basic Metabolic Panel: Recent Labs  Lab 03/04/24 0345 03/05/24 0403 03/06/24 0332  NA 136 135 133*  K 4.4 4.3 4.3  CL 98 95* 95*  CO2 29 29 27   GLUCOSE 155* 168* 115*  BUN 36* 29* 40*  CREATININE 6.90* 5.37* 6.91*  CALCIUM  8.8* 8.9 9.1  PHOS 5.3* 3.9 5.2*   CBC: Recent Labs  Lab 03/03/24 1015 03/03/24 1140 03/03/24 1336 03/05/24 0403 03/05/24 1759 03/06/24 0332  WBC 9.8  --  9.5 7.3  --  5.0  NEUTROABS 8.7*  --   --   --   --   --   HGB 8.1*   < > 8.3* 7.9* 9.5* 7.5*  HCT 26.0*   < > 26.8* 25.7* 30.4* 23.9*  MCV 85.8  --  87.0 86.0  --  85.1  PLT 92*  --  98* 141*  --  140*   < > = values in this interval not displayed.   Blood Culture    Component Value Date/Time   SDES BLOOD SITE NOT SPECIFIED 11/15/2022 0949   SPECREQUEST  11/15/2022 0949    BOTTLES DRAWN AEROBIC ONLY Blood Culture results may not be optimal due to an inadequate volume of blood received in culture bottles   CULT  11/15/2022 0949    NO GROWTH 5 DAYS Performed at [facility], [address], [place], [place] [address]    REPTSTATUS 11/20/2022 FINAL 11/15/2022 9050     Physical Exam General: Alert, nad, on nasal oxygen   Heart: RRR Lungs: coarse breath sounds   Abdomen: non tender Extremities: 1+LE edema  Dialysis Access: L AVF +bruit   Medications:   amLODipine   5 mg Oral Daily   aspirin  EC  81 mg Oral Daily   atorvastatin   80 mg Oral Daily   buprenorphine   2 patch Transdermal Weekly    carvedilol   12.5 mg Oral BID WC   fluticasone  furoate-vilanterol  1 puff Inhalation Daily   gabapentin   200 mg Oral Once   heparin   5,000 Units Subcutaneous Q8H   insulin  aspart  0-6 Units Subcutaneous TID WC   midodrine   10 mg Oral Q T,Th,Sat-1800    Dialysis Orders: [facility] [place] HD TTS 4.5h  100.8kg   B450    L AVF   Heparin  3000 (from CE) Cinacalcet  60 three times per week Hectorol  5 mcg three times per week Mircera 75 q 2 wks (last 02/29/24)   Assessment/Plan: Acute hypoxic respiratory failure:  severe bilat pulm edema by CXR. Likely this is due to vol overload d/t missed HD +/- other. Had dialysis here Friday and Saturday. 9L removed. Continue volume removal with HD. Now on RA.  ESRD: Usual HD TTS.  Renal navigator notified that patient needs CLIP to [facility] clinic. HD Tues.   HTN. Coreg  on hold for low Bps. Takes midodrine  with HD - 10 mg pre  HD.  Volume: As above. Volume overload on admit. Now near EDW. Torsemide  on non HD days - will order here. UF as able  Anemia: Hgb 7-9. On ESA as outpatient. Recently dosed. Transfuse prn. Will check Fe studiesl  DM2: Insulin  per primary   Heather Ronnald Acosta PA-C [name] 03/06/2024,9:01 AM

## 2024-03-06 NOTE — Assessment & Plan Note (Addendum)
 Resolved. Now on RA.  - Continue home Breo Ellipta , DuoNebs every 4 hours as needed

## 2024-03-06 NOTE — Assessment & Plan Note (Addendum)
 ESRD: continue T/Th/Sat dialysis CAD: s/p PCI, continue aspirin  HTN: continue midodrine , carvedilol , amlodipine  HLD: continue atorvastatin  T2DM: holding ozempic, A1c 7.0, monitor CBGs add insulin  if necessary HFpEF: echo now with EF 65-70%. Outpatient cardiology follow up for aortic stenosis. Chronic pain/neuropathy: Receives buprenorphine  patch, oxycodone  10 mg every 6 hours as needed, gabapentin  200 mg TTS after dialysis.

## 2024-03-07 DIAGNOSIS — J9601 Acute respiratory failure with hypoxia: Secondary | ICD-10-CM | POA: Diagnosis not present

## 2024-03-07 DIAGNOSIS — J810 Acute pulmonary edema: Secondary | ICD-10-CM | POA: Diagnosis not present

## 2024-03-07 LAB — GLUCOSE, CAPILLARY
Glucose-Capillary: 101 mg/dL — ABNORMAL HIGH (ref 70–99)
Glucose-Capillary: 190 mg/dL — ABNORMAL HIGH (ref 70–99)
Glucose-Capillary: 152 mg/dL — ABNORMAL HIGH (ref 70–99)

## 2024-03-07 LAB — CBC
HCT: 25.1 % — ABNORMAL LOW (ref 36.0–46.0)
HCT: 26.3 % — ABNORMAL LOW (ref 36.0–46.0)
Hemoglobin: 7.8 g/dL — ABNORMAL LOW (ref 12.0–15.0)
Hemoglobin: 8 g/dL — ABNORMAL LOW (ref 12.0–15.0)
MCH: 26.4 pg (ref 26.0–34.0)
MCH: 25.9 pg — ABNORMAL LOW (ref 26.0–34.0)
MCHC: 31.1 g/dL (ref 30.0–36.0)
MCHC: 30.4 g/dL (ref 30.0–36.0)
MCV: 84.8 fL (ref 80.0–100.0)
MCV: 85.1 fL (ref 80.0–100.0)
Platelets: 174 K/uL (ref 150–400)
Platelets: 105 K/uL — ABNORMAL LOW (ref 150–400)
RBC: 2.96 MIL/uL — ABNORMAL LOW (ref 3.87–5.11)
RBC: 3.09 MIL/uL — ABNORMAL LOW (ref 3.87–5.11)
RDW: 18.4 % — ABNORMAL HIGH (ref 11.5–15.5)
RDW: 18.3 % — ABNORMAL HIGH (ref 11.5–15.5)
WBC: 4.7 K/uL (ref 4.0–10.5)
WBC: 4.1 K/uL (ref 4.0–10.5)
nRBC: 0 % (ref 0.0–0.2)
nRBC: 0 % (ref 0.0–0.2)

## 2024-03-07 LAB — IRON AND TIBC
Iron: 69 ug/dL (ref 28–170)
Saturation Ratios: 20 % (ref 10.4–31.8)
TIBC: 353 ug/dL (ref 250–450)
UIBC: 284 ug/dL

## 2024-03-07 LAB — HEPATITIS B SURFACE ANTIBODY, QUANTITATIVE: Hep B S AB Quant (Post): 199 m[IU]/mL

## 2024-03-07 LAB — RENAL FUNCTION PANEL
Albumin: 3.1 g/dL — ABNORMAL LOW (ref 3.5–5.0)
Anion gap: 13 (ref 5–15)
BUN: 55 mg/dL — ABNORMAL HIGH (ref 8–23)
CO2: 26 mmol/L (ref 22–32)
Calcium: 9.1 mg/dL (ref 8.9–10.3)
Chloride: 93 mmol/L — ABNORMAL LOW (ref 98–111)
Creatinine, Ser: 8.35 mg/dL — ABNORMAL HIGH (ref 0.44–1.00)
GFR, Estimated: 5 mL/min — ABNORMAL LOW (ref >60)
Glucose, Bld: 102 mg/dL — ABNORMAL HIGH (ref 70–99)
Phosphorus: 6.2 mg/dL — ABNORMAL HIGH (ref 2.5–4.6)
Potassium: 4.8 mmol/L (ref 3.5–5.1)
Sodium: 132 mmol/L — ABNORMAL LOW (ref 135–145)

## 2024-03-07 LAB — FERRITIN: Ferritin: 736 ng/mL — ABNORMAL HIGH (ref 11–307)

## 2024-03-07 MED ORDER — HEPARIN SODIUM (PORCINE) 1000 UNIT/ML IJ SOLN
INTRAMUSCULAR | Status: AC
  Filled 2024-03-07: qty 3

## 2024-03-07 MED ORDER — IRON SUCROSE 500 MG IVPB - SIMPLE MED
500.0000 mg | Freq: Once | INTRAVENOUS | Status: DC
  Filled 2024-03-07: qty 275

## 2024-03-07 MED ORDER — HEPARIN SODIUM (PORCINE) 1000 UNIT/ML DIALYSIS
20.0000 [IU]/kg | INTRAMUSCULAR | Status: DC | PRN
  Administered 2024-03-07: 2100 [IU] via INTRAVENOUS_CENTRAL

## 2024-03-07 MED ORDER — CHLORHEXIDINE GLUCONATE CLOTH 2 % EX PADS
6.0000 | MEDICATED_PAD | Freq: Every day | CUTANEOUS | Status: DC
  Administered 2024-03-07 – 2024-03-08 (×2): 6 via TOPICAL

## 2024-03-07 MED ORDER — SODIUM CHLORIDE 0.9 % IV SOLN
500.0000 mg | Freq: Once | INTRAVENOUS | Status: AC
  Administered 2024-03-07: 500 mg via INTRAVENOUS
  Filled 2024-03-07: qty 25

## 2024-03-07 MED ORDER — OXYCODONE HCL 5 MG PO TABS
ORAL_TABLET | ORAL | Status: AC
  Filled 2024-03-07: qty 2

## 2024-03-07 NOTE — Progress Notes (Signed)
   03/07/24 1239  Vitals  Temp 98.1 F (36.7 C)  Pulse Rate 87  Resp 16  BP (!) 140/96  SpO2 100 %  O2 Device Room Air  Weight (S)  100 kg (Bed Scale)  Type of Weight Post-Dialysis  Oxygen  Therapy  Pulse Oximetry Type Continuous  Post Treatment  Dialyzer Clearance Clear  Hemodialysis Intake (mL) 0 mL  Liters Processed 84  Fluid Removed (mL) 3500 mL  Tolerated HD Treatment Yes  Post-Hemodialysis Comments Tx. completed without difficulties. Pt. voice no HD treatment complaints  but knee pain is still 8 to 9 even after give pain meds. Report given to 4E bedside RN.  AVG/AVF Arterial Site Held (minutes) 5 minutes  AVG/AVF Venous Site Held (minutes) 5 minutes   Received patient in bed to unit.  Alert and oriented.  Informed consent signed and in chart.   TX duration: 3.5  Patient tolerated well.  Transported back to the room  Alert, without acute distress.  Hand-off given to patient's nurse.   Access used: Yes Access issues: No  Total UF removed: 3500 Medication(s) given: See MAR Post HD VS: See Above Grid Post HD weight: 100.0 kg   Zebedee DELENA [facility]

## 2024-03-07 NOTE — Progress Notes (Addendum)
 Navigator has been advised that pt's home HD clinic has contacted local [place] clinic to see if clinic can accept pt while pt is here in the area. Pt is being reviewed for placement at [facility]. Clinic placement is dependent upon d/c plan. Update provided to nephrologist. Will assist as needed.   Randine Mungo Dialysis Navigator [phone]  Addendum at 12:25 pm: Navigator advised by attending staff that pt to likely to return home with with sister and [facility]. Spoke to pt at bedside while receiving HD. Pt states she plans to remain in the [place] area for at least 2 weeks to a month and to decide if she will remain here permanently or pt will return to her home within that time. Contacted [facility] staff to be made aware of this info and to be advised that pt is ready for d/c as soon as pt has a local HD clinic. Will await return call from clinic as to if pt can be accepted and what schedule will be at d/c.   Addendum at 3:19 pm: Pt has been accepted at [facility] on TTS 11:30 am chair time. Pt can start on Thursday and will need to arrive at 10:45 am to complete paperwork prior to treatment. Met with pt and pt's sister at bedside. Discussed above arrangements. Pt and pt's sister agreeable to plans. HD arrangements added to AVS as well. Update provided to treatment team and renal NP to send orders to clinic.

## 2024-03-07 NOTE — Assessment & Plan Note (Addendum)
 Hgb yesterday AM 7.5, this AM 7.8. PM hgb yesterday was 8.9. One of these labs does no appear to be correct. Offered transfusion yesterday as transfusion goal <8, CAD, but patient uncertain after counseling. Iron  wnl. Followed up with patient today ,and she is still uncertain about transfusion. ESA, mircera, next due 09/02. - Repeat PM CBC

## 2024-03-07 NOTE — Discharge Planning (Signed)
 [facility] Patient Discharge and New start Orders - [facility] CLINIC: [facility]  Patient's name: [name] Admit/DC Dates: 03/03/2024 - 03/07/24  DISCHARGE DIAGNOSES: Acute hypoxic respiratory failure: patient with severe bilateral pulmonary edema with cxr. This is second to volume overload due to missed HD. She had serial HD on  8/22 and 8/23 and we removed 9L.   ESRD on HD - originally at [facility] [place] on TTS. [facility] clip to [facility] clinic  HD ORDERS TTS - 4.5 hours - L AVF Heparin : 3000 units Bath: 2K/2.5 Ca EDW: 99 kg - please continue to challenge patient's EDW 180 filter Cinacalcet  60 mg three times per week Hectorol  5 mcg three times per week Mircera 75 mcg - last dose given 02/29/24 Venofer : per protocol   ACCESS INTERVENTION/CHANGE: L AVF Details:   RECENT LABS: Recent Labs  Lab 03/07/24 0523  HGB 7.8*  NA 132*  K 4.8  CALCIUM  9.1  PHOS 6.2*  ALBUMIN  3.1*    IV ANTIBIOTICS: no  Details:    OTHER/APPTS/LAB ORDERS: Please draw updated labs at next HD.   D/C Meds to be reconciled by nurse after every discharge.  Completed By: Belvie Och, NP   Reviewed by: MD:______ RN_______

## 2024-03-07 NOTE — Progress Notes (Signed)
 PT Cancellation Note  Patient Details Name: [name] MRN: [id number] DOB: Apr 19, 1956   Cancelled Treatment:    Reason Eval/Treat Not Completed: (P) Other (comment);Patient at procedure or test/unavailable (pt at HD dept.) Did not have time to reattempt in PM. Will continue efforts next date per PT plan of care as schedule permits.   [name] [name] 03/07/2024, 5:14 PM * delayed entry note

## 2024-03-07 NOTE — Plan of Care (Addendum)
 Spoke with Dr. Melia from nephrology.  He recommends giving IV iron  to better support her erythropoiesis stimulating medications plus need for rapid increase of hemoglobin in a ESRD patient on dialysis.  Once this is complete, she should be cleared for discharge.  4:50 PM Spoke with patient, she is agreeable to IV iron .  She will receive IV iron , once this is completed she can discharge.  Anticipate discharge tomorrow morning.

## 2024-03-07 NOTE — Assessment & Plan Note (Addendum)
 Now stable and resuming home HD TTS schedule and torsemide  on nondialysis days (increased by nephro from home 10 mg to 100 mg).  - nephrology following, appreciate recs - HD today, phos elevated - RFP on HD days - plan to CLIP - TOC following for dispo, still unclear.

## 2024-03-07 NOTE — Assessment & Plan Note (Signed)
 ESRD: continue T/Th/Sat dialysis CAD: s/p PCI, continue aspirin  HTN: continue midodrine , carvedilol , amlodipine  HLD: continue atorvastatin  T2DM: holding ozempic, A1c 7.0, monitor CBGs add insulin  if necessary HFpEF: echo now with EF 65-70%. Outpatient cardiology follow up for aortic stenosis. Chronic pain/neuropathy: Receives buprenorphine  patch, oxycodone  10 mg every 6 hours as needed, gabapentin  200 mg TTS after dialysis.

## 2024-03-07 NOTE — Progress Notes (Addendum)
     Daily Progress Note Intern Pager: [phone]  Patient name: [name] Medical record number: [id number] Date of birth: 06/22/1956 Age: 68 y.o. Gender: female  Primary Care Provider: Elizbeth Leita Ruth, FNP Consultants: nephrology Code Status: full  Pt Overview and Major Events to Date:  8/22 - admitted  Medical Decision Making:  68 yo female with now resolved respiratory failure from volume overload/missed HD in the setting of ESRD. She is medically stable for discharge pending dispo and CLIP. Assessment & Plan ESRD (end stage renal disease) on dialysis (HCC) Now stable and resuming home HD TTS schedule and torsemide  on nondialysis days (increased by nephro from home 10 mg to 100 mg).  - nephrology following, appreciate recs - HD today, phos elevated - RFP on HD days - plan to CLIP - TOC following for dispo, still unclear.  Anemia due to end stage renal disease (HCC) Hgb yesterday AM 7.5, this AM 7.8. PM hgb yesterday was 8.9. One of these labs does no appear to be correct. Offered transfusion yesterday as transfusion goal <8, CAD, but patient uncertain after counseling. Iron  wnl. Followed up with patient today ,and she is still uncertain about transfusion. ESA, mircera, next due 09/02. - Repeat PM CBC Chronic health problem ESRD: continue T/Th/Sat dialysis CAD: s/p PCI, continue aspirin  HTN: continue midodrine , carvedilol , amlodipine  HLD: continue atorvastatin  T2DM: holding ozempic, A1c 7.0, monitor CBGs add insulin  if necessary HFpEF: echo now with EF 65-70%. Outpatient cardiology follow up for aortic stenosis. Chronic pain/neuropathy: Receives buprenorphine  patch, oxycodone  10 mg every 6 hours as needed, gabapentin  200 mg TTS after dialysis.   FEN/GI: renal, fluid restriction PPx: [place] heparin  Dispo:patient undecided   Subjective:  No SOB, CP. At baseline.  Objective: Temp:  [97.8 F (36.6 C)-98.7 F (37.1 C)] 97.9 F (36.6 C) (08/26 0359) Pulse Rate:   [73-82] 73 (08/25 1926) Resp:  [15-20] 20 (08/26 0359) BP: (116-137)/(49-63) 116/51 (08/26 0359) SpO2:  [91 %-97 %] 91 % (08/25 2326) Physical Exam: General: well appearing female lying in bed Cardiovascular: holosystolic murmur, RRR, no JVD Respiratory: CTAB, no increased WOB Extremities: no peripheral edema  Laboratory: Most recent CBC Lab Results  Component Value Date   WBC 4.7 03/07/2024   HGB 7.8 (L) 03/07/2024   HCT 25.1 (L) 03/07/2024   MCV 84.8 03/07/2024   PLT 174 03/07/2024   Most recent BMP    Latest Ref Rng & Units 03/07/2024    5:23 AM  BMP  Glucose 70 - 99 mg/dL 897   BUN 8 - 23 mg/dL 55   Creatinine 9.55 - 1.00 mg/dL 1.64   Sodium 864 - 854 mmol/L 132   Potassium 3.5 - 5.1 mmol/L 4.8   Chloride 98 - 111 mmol/L 93   CO2 22 - 32 mmol/L 26   Calcium  8.9 - 10.3 mg/dL 9.1     Imaging/Diagnostic Tests: None  Alena Morrison, Elio, MD 03/07/2024, 7:47 AM  PGY-1, [facility] FPTS Intern pager: [phone], text pages welcome Secure chat group [facility] Teaching Service

## 2024-03-07 NOTE — TOC Progression Note (Signed)
 Transition of Care [facility]) - Progression Note    Patient Details  Name: [name] MRN: [id number] Date of Birth: [birth year]  [facility]) CM/SW Contact  [name], [place] Phone Number: 03/07/2024, 3:17 PM  Clinical Narrative:    CSW met with patient at bedside. Patient confirmed, once stable she will d/c to her sister's home. Renal navigator is aware and is working on securing HD clinic.   Montie Louder, MSW, LCSW Clinical Social Worker                       Expected Discharge Plan and Services                                               Social Drivers of Health (SDOH) Interventions SDOH Screenings   Food Insecurity: No Food Insecurity (03/04/2024)  Housing: Low Risk  (03/06/2024)  Transportation Needs: No Transportation Needs (03/04/2024)  Utilities: Not At Risk (03/04/2024)  Alcohol Screen: Low Risk  (03/03/2021)  Depression (PHQ2-9): High Risk (03/03/2021)  Financial Resource Strain: Low Risk  (12/01/2023)   Received from [facility]  Physical Activity: Inactive (12/01/2023)   Received from [facility]  Social Connections: Unknown (03/04/2024)  Stress: No Stress Concern Present (12/01/2023)   [facility]  Tobacco Use: Medium Risk (03/03/2024)  Health Literacy: Adequate Health Literacy (12/01/2023)   [facility] from [facility]    Readmission Risk Interventions    11/01/2022    9:36 AM  Readmission Risk Prevention Plan  Transportation Screening Complete  PCP or Specialist Appt within 3-5 Days Complete  HRI or Home Care Consult Complete  Social Work Consult for Recovery Care Planning/Counseling Complete  Palliative Care Screening Not Applicable  Medication Review Oceanographer) Complete

## 2024-03-07 NOTE — TOC Transition Note (Signed)
 Transition of Care (TOC) - Discharge Note Rayfield Gobble RN, BSN Inpatient Care Management Unit 4E- RN Case Manager See Treatment Team for direct phone #   Patient Details  Name: [name] MRN: [id number] Date of Birth: 12/10/1955  Transition of Care [facility]) CM/SW Contact:  Gobble Rayfield Hurst, RN Phone Number: 03/07/2024, 4:30 PM   Clinical Narrative:    Per attending team pt possible for d/c pending lab results.  Pt will be staying here with sister and outpt HD has been arranged- see renal navigator note.   CM spoke with pt at bedside to discuss [facility] and DME needs.- Per pt she has a walker and voiced that it is less than 89yrs old so insurance will not cover another at this time- pt voiced she will look into buying one herself locally if needed. Declined DME for discharge  Choice offered for [facility]- per pt she uses [facility] back home in [place]- that agency does not cover this area- pt voiced she does not have a preference to agency here- declined list stating she is not familiar with any here.  Agreeable to this CM securing on her behalf.  Sister's address- Diane [place] [address], [place] [address] [phone]  Pt confirmed her phone contact as- [phone]  Sister will transport home.   Call made to [facility] liaison for [facility] referral PT/OT- pending orders- referral has been accepted and they will follow up for scheduling.   No further IP CM needs noted.    Final next level of care: Home w Home Health Services Barriers to Discharge: Barriers Resolved   Patient Goals and CMS Choice Patient states their goals for this hospitalization and ongoing recovery are:: return home with sister for now and get stronger   Choice offered to / list presented to : Patient      Discharge Placement               Home w/ [facility]        Discharge Plan and Services Additional resources added to the After Visit Summary for   In-house Referral: Clinical Social Work Discharge  Planning Services: CM Consult Post Acute Care Choice: Home Health, Durable Medical Equipment          DME Arranged: N/A DME Agency: NA       HH Arranged: PT, OT HH Agency: [facility] Date [facility] Agency Contacted: 03/07/24 Time HH Agency Contacted: 1629 Representative spoke with at [facility] Agency: Amy  Social Drivers of Health (SDOH) Interventions SDOH Screenings   Food Insecurity: No Food Insecurity (03/04/2024)  Housing: Low Risk  (03/06/2024)  Transportation Needs: No Transportation Needs (03/04/2024)  Utilities: Not At Risk (03/04/2024)  Alcohol Screen: Low Risk  (03/03/2021)  Depression (PHQ2-9): High Risk (03/03/2021)  Financial Resource Strain: Low Risk  (12/01/2023)   Received from [facility]  Physical Activity: Inactive (12/01/2023)   Received from [facility]  Social Connections: Unknown (03/04/2024)  Stress: No Stress Concern Present (12/01/2023)   Received from [facility]  Tobacco Use: Medium Risk (03/03/2024)  Health Literacy: Adequate Health Literacy (12/01/2023)   Received from [facility]     Readmission Risk Interventions    03/07/2024    4:29 PM 11/01/2022    9:36 AM  Readmission Risk Prevention Plan  Transportation Screening Complete Complete  PCP or Specialist Appt within 3-5 Days  Complete  HRI or Home Care Consult Complete Complete  Social Work  Consult for Recovery Care Planning/Counseling Complete Complete  Palliative Care Screening Not Applicable Not Applicable  Medication Review (RN Care Manager) Complete Complete

## 2024-03-07 NOTE — Progress Notes (Signed)
 [facility] Progress Note   Dialysis Orders: [facility] [place] HD TTS 4.5h  100.8kg   B450    L AVF   Heparin  3000 (from CE) Cinacalcet  60 three times per week Hectorol  5 mcg three times per week Mircera 75 q 2 wks (last 02/29/24)   Assessment/Plan: Acute hypoxic respiratory failure:  severe bilat pulm edema by CXR. Likely this is due to vol overload d/t missed HD +/- other. Had dialysis here Friday and Saturday. 9L removed. Continue volume removal with HD. Now on RA.  ESRD: Usual HD TTS.  Renal navigator notified that patient needs CLIP to [facility] clinic.  Patient seen on dialysis through a left upper arm access 2.5 L net UF 3K bath 126/60 She appears to be stable; no changes at this current time  Renal navigator is already on board; appreciate, they will submit referral based on patient's discharge plan.   HTN. Coreg  on hold for low Bps. Takes midodrine  with HD - 10 mg pre HD.  Volume: As above. Volume overload on admit. Now near EDW. Torsemide  on non HD days - will order here. UF as able  Anemia: Hgb 7-9. On ESA as outpatient. Recently dosed. Transfuse prn. Will check Fe studiesl  DM2: Insulin  per primary   Subjective: Seen at dialysis; comfortable on room air. Denies chest pain, sob. Needs CLIP to HD center in [place].   Objective Vitals:   03/07/24 0816 03/07/24 0846 03/07/24 0900 03/07/24 0930  BP: (!) 111/50 (!) 135/43 126/60 (!) 135/43  Pulse: 74 69 73 70  Resp: 16 11 12 10   Temp: 97.8 F (36.6 C)     TempSrc:      SpO2: 96% 97% 93% 92%  Weight: (S) 103.6 kg     Height:         Additional Objective Labs: Basic Metabolic Panel: Recent Labs  Lab 03/05/24 0403 03/06/24 0332 03/07/24 0523  NA 135 133* 132*  K 4.3 4.3 4.8  CL 95* 95* 93*  CO2 29 27 26   GLUCOSE 168* 115* 102*  BUN 29* 40* 55*  CREATININE 5.37* 6.91* 8.35*  CALCIUM  8.9 9.1 9.1  PHOS 3.9 5.2* 6.2*   CBC: Recent Labs  Lab 03/03/24 1015 03/03/24 1140 03/03/24 1336  03/05/24 0403 03/05/24 1759 03/06/24 0332 03/06/24 1130 03/07/24 0523  WBC 9.8  --  9.5 7.3  --  5.0 4.9 4.7  NEUTROABS 8.7*  --   --   --   --   --   --   --   HGB 8.1*   < > 8.3* 7.9*   < > 7.5* 8.9* 7.8*  HCT 26.0*   < > 26.8* 25.7*   < > 23.9* 28.5* 25.1*  MCV 85.8  --  87.0 86.0  --  85.1 85.1 84.8  PLT 92*  --  98* 141*  --  140* 148* 174   < > = values in this interval not displayed.   Blood Culture    Component Value Date/Time   SDES BLOOD SITE NOT SPECIFIED 11/15/2022 0949   SPECREQUEST  11/15/2022 0949    BOTTLES DRAWN AEROBIC ONLY Blood Culture results may not be optimal due to an inadequate volume of blood received in culture bottles   CULT  11/15/2022 0949    NO GROWTH 5 DAYS Performed at [facility], [address] [place], [place] [address]    REPTSTATUS 11/20/2022 FINAL 11/15/2022 9050     Physical Exam General: Alert, nad, on nasal  oxygen   Heart: RRR Lungs: coarse breath sounds   Abdomen: non tender Extremities: 1+LE edema  Dialysis Access: L AVF +bruit   Medications:   amLODipine   5 mg Oral Daily   aspirin  EC  81 mg Oral Daily   atorvastatin   80 mg Oral Daily   buprenorphine   2 patch Transdermal Weekly   carvedilol   12.5 mg Oral BID WC   Chlorhexidine  Gluconate Cloth  6 each Topical Daily   fluticasone  furoate-vilanterol  1 puff Inhalation Daily   gabapentin   200 mg Oral Once per day on Tuesday Thursday Saturday   heparin   5,000 Units Subcutaneous Q8H   insulin  aspart  0-6 Units Subcutaneous TID WC   midodrine   10 mg Oral Q T,Th,Sat-1800   [START ON 03/08/2024] torsemide   100 mg Oral Once per day on Sunday Monday Wednesday Friday

## 2024-03-07 NOTE — Progress Notes (Signed)
 I went to the patient's bedside to discuss her need for a blood transfusion. With her were her sister, Diane Mitchell, and two other family members. She gave permission to discuss the test results while her family members were present. I discussed her low hemoglobin level < 8, which is her blood transfusion threshold, and the need for blood transfusion. Patient is adamant that she does not want to be transfused with any blood product. She is aware of the implications of not getting a blood transfusion with hemoglobin < 8 and her history of CAD. She understands this can lead to multiple complications, including death. She stated she is already receiving Micera, which is all she is willing to take at this time.  I spoke with Ronal Plants, a member of the Ethics Committee, to determine if there is any paperwork to complete regarding her treatment refusal. She recommends that the patient have advance directives on file; otherwise, no other paperwork is needed. We will initiate an advanced directive inpatient with spiritual consultation or with her PCP.  I discussed this issue with Dr. Lynwood Lin/Nephrology, and he recommended Feraheme dose x 1 prior to discharge from the hospital, and she can continue with her regular Micera regimen. ED precautions discussed. She and her family agreed with the plan.

## 2024-03-08 ENCOUNTER — Other Ambulatory Visit (HOSPITAL_COMMUNITY): Payer: Self-pay

## 2024-03-08 ENCOUNTER — Encounter: Payer: Self-pay | Admitting: Family Medicine

## 2024-03-08 DIAGNOSIS — J810 Acute pulmonary edema: Secondary | ICD-10-CM | POA: Diagnosis not present

## 2024-03-08 DIAGNOSIS — Z789 Other specified health status: Secondary | ICD-10-CM | POA: Insufficient documentation

## 2024-03-08 DIAGNOSIS — J9601 Acute respiratory failure with hypoxia: Secondary | ICD-10-CM | POA: Diagnosis not present

## 2024-03-08 LAB — GLUCOSE, CAPILLARY: Glucose-Capillary: 178 mg/dL — ABNORMAL HIGH (ref 70–99)

## 2024-03-08 MED ORDER — NITROGLYCERIN 0.4 MG SL SUBL
0.4000 mg | SUBLINGUAL_TABLET | SUBLINGUAL | 0 refills | Status: AC | PRN
Start: 2024-03-08 — End: ?
  Filled 2024-03-08: qty 25, 6d supply, fill #0

## 2024-03-08 MED ORDER — GABAPENTIN 100 MG PO CAPS
ORAL_CAPSULE | ORAL | 0 refills | Status: DC
  Filled 2024-03-08: qty 24, 28d supply, fill #0

## 2024-03-08 MED ORDER — TORSEMIDE 100 MG PO TABS
ORAL_TABLET | ORAL | 0 refills | Status: DC
  Filled 2024-03-08: qty 30, 50d supply, fill #0

## 2024-03-08 MED ORDER — TIZANIDINE HCL 4 MG PO TABS
4.0000 mg | ORAL_TABLET | Freq: Every day | ORAL | 0 refills | Status: DC | PRN
  Filled 2024-03-08: qty 30, 30d supply, fill #0

## 2024-03-08 NOTE — Progress Notes (Signed)
 Pt to d/c today. Contacted Geronimo Car this morning to be advised that pt will d/c today and should start tomorrow as planned. Navigator met with pt yesterday at bedside to provide HD arrangements. HD info placed on AVS as well. Renal NP sent orders to clinic yesterday.   Randine Mungo Dialysis Navigator [phone]

## 2024-03-08 NOTE — Progress Notes (Signed)
 Occupational Therapy Treatment Patient Details Name: [name] MRN: [id number] DOB: [birth year] Today's Date: 03/08/2024   History of present illness Pt is a 68 y.o female admitted 8/22 for SOB, and missed HD session.Chest x-[place] showed fluid overload. PMH: HFpEF, ESRD, HTN, CAD, DM   OT comments  Pt progressing well towards goals. Pt with significant functional improvements from initial eval. Improved to complete standing ADLs at supervision level and increased time for LB. Pt continues to have decreased attention, memory, and problem solving skills potentially close to baseline, but no family present. Given improvements pt should d/c with HHOT to optimize independence levels. Will continue to follow acutely.       If plan is discharge home, recommend the following:  A little help with walking and/or transfers;A little help with bathing/dressing/bathroom   Equipment Recommendations  BSC/3in1    Recommendations for Other Services      Precautions / Restrictions Precautions Precautions: Fall Recall of Precautions/Restrictions: Impaired Restrictions Weight Bearing Restrictions Per Provider Order: No       Mobility Bed Mobility Overal bed mobility: Needs Assistance Bed Mobility: Supine to Sit     Supine to sit: Supervision, HOB elevated     General bed mobility comments: S for safety, reliant on bed features    Transfers Overall transfer level: Needs assistance Equipment used: None Transfers: Sit to/from Stand Sit to Stand: Supervision           General transfer comment: S for safety to stand while getting dressed     Balance Overall balance assessment: Needs assistance Sitting-balance support: No upper extremity supported, Feet supported Sitting balance-Leahy Scale: Good     Standing balance support: No upper extremity supported, During functional activity Standing balance-Leahy Scale: Fair Standing balance comment: Able to stand and pull pants above waist  no UE supported       ADL either performed or assessed with clinical judgement   ADL Overall ADL's : Needs assistance/impaired         Upper Body Bathing: Set up;Sitting   Lower Body Bathing: Supervison/ safety;Sit to/from stand   Upper Body Dressing : Set up;Sitting   Lower Body Dressing: Supervision/safety;Sit to/from stand Lower Body Dressing Details (indicate cue type and reason): Increased time for socks             Functional mobility during ADLs: Supervision/safety;Rolling walker (2 wheels) General ADL Comments: Improved flexibility for LB ADLs, improved STS and standing balance    Extremity/Trunk Assessment Upper Extremity Assessment Upper Extremity Assessment: Generalized weakness   Lower Extremity Assessment Lower Extremity Assessment: Defer to PT evaluation        Vision   Vision Assessment?: No apparent visual deficits         Communication Communication Communication: No apparent difficulties   Cognition Arousal: Alert Behavior During Therapy: WFL for tasks assessed/performed Cognition: No family/caregiver present to determine baseline, Cognition impaired     Awareness: Online awareness impaired Memory impairment (select all impairments): Short-term memory Attention impairment (select first level of impairment): Sustained attention Executive functioning impairment (select all impairments): Problem solving, Reasoning OT - Cognition Comments: Pt with decreased problem solving, difficulty comprehending d/c process, improved orientation and awareness, potententially baseline deficits, no family present         Following commands: Impaired Following commands impaired: Follows one step commands with increased time, Only follows one step commands consistently      Cueing   Cueing Techniques: Verbal cues, Gestural cues  General Comments d/c RN assisting with removing tele and IV    Pertinent Vitals/ Pain       Pain Assessment Pain  Assessment: No/denies pain Pain Intervention(s): Monitored during session   Frequency  Min 2X/week        Progress Toward Goals  OT Goals(current goals can now be found in the care plan section)  Progress towards OT goals: Progressing toward goals  Acute Rehab OT Goals Patient Stated Goal: To go home OT Goal Formulation: With patient Time For Goal Achievement: 03/18/24 Potential to Achieve Goals: Good ADL Goals Pt Will Perform Lower Body Dressing: with min assist;sit to/from stand Pt Will Transfer to Toilet: with min assist;ambulating;regular height toilet Pt Will Perform Toileting - Clothing Manipulation and hygiene: with min assist;sit to/from stand;sitting/lateral leans  Plan         AM-PAC OT 6 Clicks Daily Activity     Outcome Measure   Help from another person eating meals?: None Help from another person taking care of personal grooming?: A Little Help from another person toileting, which includes using toliet, bedpan, or urinal?: A Little Help from another person bathing (including washing, rinsing, drying)?: A Little Help from another person to put on and taking off regular upper body clothing?: A Little Help from another person to put on and taking off regular lower body clothing?: A Little 6 Click Score: 19    End of Session    OT Visit Diagnosis: Unsteadiness on feet (R26.81);Other abnormalities of gait and mobility (R26.89);Repeated falls (R29.6);Muscle weakness (generalized) (M62.81)   Activity Tolerance Patient tolerated treatment well   Patient Left in bed;with call bell/phone within reach   Nurse Communication Mobility status        Time: 8984-8897 OT Time Calculation (min): 47 min  Charges: OT General Charges $OT Visit: 1 Visit OT Treatments $Self Care/Home Management : 38-52 mins  Adrianne BROCKS, OT  Acute Rehabilitation Services Office [phone] Secure chat preferred   Adrianne GORMAN Savers 03/08/2024, 11:11 AM

## 2024-03-08 NOTE — Progress Notes (Signed)
 OT completed.  Discharge lounge notified.

## 2024-03-08 NOTE — Progress Notes (Signed)
 PT Cancellation Note  Patient Details Name: [name] MRN: [id number] DOB: 1956-05-18   Cancelled Treatment:    Reason Eval/Treat Not Completed: (P) Other (comment) (pt working with OT on bath. Had planned to perform stair training but per chart review, pt pending DC to lounge and staff awaiting OT session completion to DC her.)    [name] Blue 03/08/2024, 10:54 AM

## 2024-03-08 NOTE — Care Management Important Message (Signed)
 Important Message  Patient Details  Name: [name] MRN: [id number] Date of Birth: [birth year]   Important Message Given:  Yes - Medicare IM     Vonzell Arrie Sharps 03/08/2024, 12:00 PM

## 2024-03-08 NOTE — Discharge Instructions (Addendum)
 Dear [name],  Thank you for letting us  participate in your care. You were hospitalized for breathing trouble and diagnosed with Acute hypoxic respiratory failure (HCC). This was likely from missing dialysis a couple times. You were treated with hemodialysis and your torsemide  dose was increased.  Additionally, your red blood cell counts were low (this is what carries oxygen  to your body). We gave you IV iron  to try to help your blood count go up. You receive a medication called Mircera outpatient that also helps your body create blood cells. Potentially, this dose could be increased outpatient. You are next due for it around 03/14/24.  POST-HOSPITAL & CARE INSTRUCTIONS Follow up with PCP in one week from discharge. See cardiology to evaluate your asymptomatic aortic stenosis in your heart. Consider making an advanced care directive so that everyone knows your medical wishes. Go to your follow up appointments (listed below)  REASONS TO RETURN TO THE HOSPITAL Worsening difficulty breathing   DOCTOR'S APPOINTMENT   No future appointments.  Follow-up Information     James, [facility]. Go on 03/09/2024.   Why: Schedule is Tuesday, Thursday, Saturday with 11:30 am chair time.  On Thursday (8/28), please arrive at 10:45 am to complete paperwork prior to treatment. Contact information: [address] [place] [address] [phone]         [facility] Follow up.   Why: [name]) HHPT/OT arranged- they will contact you to schedule Contact information: [address] [place] [address] [phone]                 Take care and be well!  Family Medicine Teaching Service Inpatient Team [place]  [facility]  [address] [place], [place] [address] [phone]

## 2024-03-08 NOTE — Progress Notes (Signed)
   03/08/24 0905  Spiritual Encounters  Type of Visit Initial  Care provided to: Patient  Reason for visit Routine spiritual support  OnCall Visit No  Spiritual Framework  Presenting Themes Meaning/purpose/sources of inspiration;Values and beliefs;Significant life change;Courage hope and growth;Impactful experiences and emotions;Coping tools;Rituals and practive;Community and relationships  Values/beliefs Jehovah's Witness  Community/Connection Family;Friend(s);Faith community  Goals  Self/Personal Goals To become baptized/learn to play piano  Interventions  Spiritual Care Interventions Made Established relationship of care and support;Compassionate presence;Reflective listening;Decision-making support/facilitation;Narrative/life review;Explored values/beliefs/practices/strengths;Meaning making;Prayer;Encouragement  Intervention Outcomes  Outcomes Connection to spiritual care;Awareness around self/spiritual resourses;Connection to values and goals of care;Autonomy/agency;Reduced isolation  Advance Directives (For Healthcare)  Does Patient Have a Medical Advance Directive? No  Would patient like information on creating a medical advance directive? Yes (Inpatient - patient defers creating a medical advance directive at this time - Information given)    Chaplain responded to spiritual care consult. Pt [name] shared much about her life and spiritual/religious journey, culminating in the [place] Witness faith tradition. Her birthday is in 2.5 weeks, and she hopes to become baptized soon. Chaplains remain available as needs arise.

## 2024-03-08 NOTE — Progress Notes (Signed)
 Physical Therapy Treatment Patient Details Name: [name] MRN: [id number] DOB: April 07, 1956 Today's Date: 03/08/2024   History of Present Illness Pt is a 68 y.o female admitted 8/22 for SOB, and missed HD session.Chest x-Mancha showed fluid overload. PMH: HFpEF, ESRD, HTN, CAD, DM    PT Comments  Pt received seated EOB, agreeable to therapy session and with good participation in stair negotiation via platform step (4) at bedside. Pt needing up to minA via HHA and RUE cane support to perform, then requesting to sit EOB to finish packing up belongings prior to impending DC. Pt reports she will have physical assist at home with stair negotiation. Pt continues to benefit from PT services to progress toward functional mobility goals.     If plan is discharge home, recommend the following: Help with stairs or ramp for entrance;Assist for transportation   Can travel by private vehicle     Yes  Equipment Recommendations  Rolling walker (2 wheels)    Recommendations for Other Services       Precautions / Restrictions Precautions Precautions: Fall Recall of Precautions/Restrictions: Impaired Restrictions Weight Bearing Restrictions Per Provider Order: No     Mobility  Bed Mobility Overal bed mobility: Needs Assistance             General bed mobility comments: pt received sitting EOB, wanting to sit EOB at end of session to pack belongings    Transfers Overall transfer level: Needs assistance Equipment used: Straight cane Transfers: Sit to/from Stand Sit to Stand: Contact guard assist           General transfer comment: from EOB with cane, CGA for stand>sit due to decreased eccentric control to sit.    Ambulation/Gait Ambulation/Gait assistance: Contact guard assist Gait Distance (Feet): 3 Feet Assistive device: Straight cane         General Gait Details: distance limited per pt request due to upcoming DC and pt wanting to conserve energy after practicing stairs  at bedside; cane per pt request; discharge lounge RN arriving at end of session.   Stairs Stairs: Yes Stairs assistance: Min assist Stair Management: No rails, Step to pattern, Backwards, Forwards, With cane Number of Stairs: 3 General stair comments: single 4 step x3 reps with LUE HHA and RUE cane, CGA to minA to step up and minA for stability stepping down, pt defers needing higher step to practice and reports her sister's home has rails for steps, although in PT eval it was reported as no rails. Pt states someone can help me up/down stairs upon DC.   Wheelchair Mobility     Tilt Bed    Modified Rankin (Stroke Patients Only)       Balance Overall balance assessment: Needs assistance Sitting-balance support: No upper extremity supported, Feet supported Sitting balance-Leahy Scale: Good     Standing balance support: No upper extremity supported, During functional activity Standing balance-Leahy Scale: Poor Standing balance comment: for dynamic standing tasks; fair to poor with cane for step-ups                            Communication Communication Communication: No apparent difficulties  Cognition Arousal: Alert Behavior During Therapy: WFL for tasks assessed/performed, Flat affect   PT - Cognitive impairments: No family/caregiver present to determine baseline                       PT - Cognition Comments: Pt adamantly defers  gait belt or bed alarm after discussion of safety and fall risk prevention. Following commands: Impaired Following commands impaired: Follows one step commands with increased time, Only follows one step commands consistently    Cueing Cueing Techniques: Verbal cues, Gestural cues  Exercises      General Comments General comments (skin integrity, edema, etc.): pt wearing paper scrubs, asked PTA to assist her donning her earrings prior to standing up. No acute s/sx distress and tele off, pt VSS per chart review prior to  session      Pertinent Vitals/Pain Pain Assessment Pain Assessment: No/denies pain Pain Intervention(s): Monitored during session    Home Living                          Prior Function            PT Goals (current goals can now be found in the care plan section) Acute Rehab PT Goals Patient Stated Goal: Get well PT Goal Formulation: With patient Time For Goal Achievement: 03/17/24 Progress towards PT goals: Progressing toward goals    Frequency    Min 2X/week      PT Plan      Co-evaluation              AM-PAC PT 6 Clicks Mobility   Outcome Measure  Help needed turning from your back to your side while in a flat bed without using bedrails?: None Help needed moving from lying on your back to sitting on the side of a flat bed without using bedrails?: A Little Help needed moving to and from a bed to a chair (including a wheelchair)?: A Little Help needed standing up from a chair using your arms (e.g., wheelchair or bedside chair)?: A Little Help needed to walk in hospital room?: A Little Help needed climbing 3-5 steps with a railing? : A Little 6 Click Score: 19    End of Session Equipment Utilized During Treatment: Other (comment) (pt refusing gait belt despite education on fall risk prevention) Activity Tolerance: Patient tolerated treatment well;Other (comment) (impending DC limiting session time) Patient left: in bed;with call bell/phone within reach;Other (comment) (pt refusing bed alarm) Nurse Communication: Mobility status;Other (comment) (pt states she is ready for DC) PT Visit Diagnosis: Unsteadiness on feet (R26.81);Other abnormalities of gait and mobility (R26.89);Difficulty in walking, not elsewhere classified (R26.2);Muscle weakness (generalized) (M62.81);History of falling (Z91.81);Pain     Time: 8895-8885 PT Time Calculation (min) (ACUTE ONLY): 10 min  Charges:    $Gait Training: 8-22 mins PT General Charges $$ ACUTE PT VISIT:  1 Visit                     Milena Liggett P., PTA Acute Rehabilitation Services Secure Chat Preferred 9a-5:30pm Office: [phone]    Connell HERO [facility] 03/08/2024, 11:26 AM

## 2024-03-08 NOTE — Discharge Summary (Addendum)
 Family Medicine Teaching [facility] Discharge Summary  Patient name: [name] Medical record number: [id number] Date of birth: [birth year] Age: 68 y.o. Gender: female Date of Admission: 03/03/2024  Date of Discharge: 03/08/24 Admitting Physician: Elyce Prescott, DO  Primary Care Provider: Elizbeth Leita Ruth, FNP Consultants: Nephrology  Indication for Hospitalization: Acute Hypoxic Respiratory Failure from missed dialysis  Discharge Diagnoses/Problem List:  Principal Problem:   Acute hypoxic respiratory failure (HCC) Active Problems:   ESRD (end stage renal disease) on dialysis (HCC)   Anemia due to end stage renal disease [facility])   Chronic health problem  Brief Hospital Course:  KIANTE PETROVICH is a 68 y.o.female with history of ESRD on dialysis, CAD s/p stenting, OSA, HTN, HLD, T2DM, GERD, HFpEF, COPD, asthma who was admitted to the [facility] Medicine Teaching Service at [facility] for acute hypoxic respiratory failure likely 2/2 volume overload from missed dialysis and stopping torsemide . Her hospital course is outlined below:  Acute hypoxic respiratory failure  ESRD Patient presented with SOB, chest tightness, orthopnea found to be AHRF 2/2 volume overload from missed dialysis and stopping torsemide . CPAP started with EMS and transitioned to BiPAP on arrival to ED, quickly weaned to [facility]. Work-up included CXR which showed pulmonary edema.  Echo showed EF 65 to 70%. Nephrology was consulted for dialysis, additional HD session on admission then back to HD TTS. Patient was placed on a nitroglycerin  drip in the ED, suspected for pulmonary edema, which was stopped on the floor. Duonebs were administered as needed. Patient originally requiring oxygen  during this admission, however was able to be weaned to room air with dialysis. Nephrology recommended Torsemide  100 mg administered on non-dialysis days  Chronic pain  neuropathy Patient taking Lyrica  100 mg daily, percocet, buprenorphine ,  and hydroxyzine  outpatient with reported sedation. Given safety profile of gabapentin  over lyrica  with ESRD, Gabapentin  200 mg TTS following dialysis was ordered. Topical lidocaine  as well for pain management. Buprenorphine  patches were replaced during admission. Patient did use oxycodone  ordered PRN.  Anemia of Chronic Disease Transfusion threshold <8. Hgb in the 7s during admission, asymptomatic. Outpatient Mircera, ESA, on 02/29/24, next not due till 03/14/24. Anemia panel ordered with marginally low iron  levels. Patient did not accept blood transfusion given religious studies. Was given IV Venofer  x1.   Other chronic conditions were medically managed with home medications and formulary alternatives as necessary (CAD, HTN, HLD, T2DM,HFpEF, chronic pain, neuropathy, asthma)  Follow-up recommendations: 1.  Establish outpatient hemodialysis 2.  See cardiology for moderate to severe aortic stenosis 3.  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 Recommend PCP establish advanced care directives and that patient preference is known and documented in patient's chart moving forward. 4. Consider increasing Mircera dosing to augment Hgb, specifically as she will not accept blood products. 5. Consider Iron  supplementation to augment Hgb.    Results/Tests Pending at Time of Discharge:  Unresulted Labs (From admission, onward)     Start     Ordered   03/07/24 0800  Renal function panel  Every Tue,Thu,Sat (0800),   R     Question:  Specimen collection method  Answer:  Lab=Lab collect   03/06/24 1100   03/03/24 0840  CBC with Differential  Once,   STAT        03/03/24 0839             Disposition: HH PT  Discharge  Condition: stable  Discharge Exam:  Vitals:   03/08/24 0757 03/08/24 0845  BP: (!) 126/55 (!) 126/55  Pulse: 72 74  Resp: 15   Temp: 97.7 F (36.5 C)   SpO2:  98% 94%   General: patient sleeping comfortably in bed CV: RRR, holosystolic murmur Pulm: CTAB, breathing comfortably on room air Extremities: No peripheral edema  Significant Procedures: hemodialysis x 3 (8/22, 8/23, 8/25)  Significant Labs and Imaging:  Recent Labs  Lab 03/06/24 1130 03/07/24 0523 03/07/24 1613  WBC 4.9 4.7 4.1  HGB 8.9* 7.8* 8.0*  HCT 28.5* 25.1* 26.3*  PLT 148* 174 105*   Recent Labs  Lab 03/07/24 0523  NA 132*  K 4.8  CL 93*  CO2 26  GLUCOSE 102*  BUN 55*  CREATININE 8.35*  CALCIUM  9.1  PHOS 6.2*  ALBUMIN  3.1*    Echo 03/04/24 Radiology Impression 1. Left ventricular ejection fraction, by estimation, is 65 to 70%. Left  ventricular ejection fraction by PLAX is 67 %. The left ventricle has  normal function. The left ventricle has no regional wall motion  abnormalities. There is mild left ventricular  hypertrophy. Left ventricular diastolic parameters are consistent with  Grade I diastolic dysfunction (impaired relaxation). Elevated left  ventricular end-diastolic pressure. The E/e' is 22.   2. Right ventricular systolic function is normal. The right ventricular  size is normal. Tricuspid regurgitation signal is inadequate for assessing  PA pressure.   3. Left atrial size was moderately dilated.   4. The mitral valve is degenerative. Trivial mitral valve regurgitation.  Moderate mitral stenosis. The mean mitral valve gradient is 8.0 mmHg with  average heart rate of 85 bpm. Moderate to severe mitral annular  calcification. MVA 1.47 cm2 by VTI.   5. The aortic valve is tricuspid. There is moderate calcification of the  aortic valve. Aortic valve regurgitation is mild. Moderate to severe  aortic valve stenosis. Aortic valve area, by VTI measures 1.03 cm. Aortic  valve mean gradient measures 26.0  mmHg. Aortic valve Vmax measures 3.31 m/s. Peak gradient 43.8 mmHg, DI  0.36.   6. The inferior vena cava is dilated in size with <50% respiratory   variability, suggesting right atrial pressure of 15 mmHg.   Comparison(s): Changes from prior study are noted. 09/19/2021: LVEF 60-65%,  mild MS - MG 6 mmHg, moderate MAC, mild AI, mild AS- MG 22 mmHg.   Chest xray 03/03/24 Radiology Impression Newly seen interstitial and alveolar density consistent with pulmonary edema. Superimposed focal density in the perihilar regions and lower lobes right more than left could possibly indicate associated pneumonia. Congestive heart failure is favored primarily.  Discharge Medications:  Allergies as of 03/08/2024       Reactions   Shellfish Allergy Anaphylaxis, Swelling   Diazepam  Other (See Comments)   felt like out of body experience   Morphine  Itching, Other (See Comments)   Tolerated hydromorphone  on 07/2020 *will take along with Benadryl *        Medication List     STOP taking these medications    methocarbamol  500 MG tablet Commonly known as: ROBAXIN    pregabalin  100 MG capsule Commonly known as: LYRICA        TAKE these medications    albuterol  108 (90 Base) MCG/ACT inhaler Commonly known as: VENTOLIN  HFA Inhale 2 puffs into the lungs every 4 (four) hours as needed for wheezing or shortness of breath.   amLODipine  10 MG tablet Commonly known as: NORVASC  Take 10 mg by mouth  See admin instructions. Take one tablet by mouth on Monday, Wednesdays, Fridays, and Sundays only   ammonium lactate 12 % lotion Commonly known as: LAC-HYDRIN Apply 1 Application topically daily.   aspirin  EC 81 MG tablet Take 1 tablet (81 mg total) by mouth daily.   atorvastatin  80 MG tablet Commonly known as: LIPITOR  Take 1 tablet (80 mg total) by mouth every evening. What changed: when to take this   Blood Glucose Test Strips 333 Strp Enough test stripes to test blood sugar TID x 30 days. No refills   buprenorphine  15 MCG/HR Commonly known as: BUTRANS  Place 1 patch onto the skin once a week. Sundays   carvedilol  12.5 MG  tablet Commonly known as: COREG  Take 1 tablet (12.5 mg total) by mouth 2 (two) times daily with a meal.   fluticasone  50 MCG/ACT nasal spray Commonly known as: FLONASE  Place 2 sprays into both nostrils daily as needed for allergies or rhinitis.   fluticasone  furoate-vilanterol 200-25 MCG/INH Aepb Commonly known as: BREO ELLIPTA  Inhale 1 puff into the lungs daily.   FreeStyle Libre 14 Day Reader Espiridion To check blood sugar ACHS   FreeStyle Libre 14 Day Sensor Misc To check blood sugar ACHS; change every 14 days   gabapentin  100 MG capsule Commonly known as: NEURONTIN  Please take 2 capsules (200 mg) after dialysis on dialysis days only Tuesday, Thursday, Saturday. Start taking on: March 09, 2024   hydrOXYzine  25 MG tablet Commonly known as: ATARAX  Take 1 tablet (25 mg total) by mouth 2 (two) times daily.   Insulin  Pen Needle 32G X 4 MM Misc Enough pen needles for 100 units of novolin  QAM x 30 days. No refills   lidocaine  5 % Commonly known as: Lidoderm  Place 1 patch onto the skin daily. Remove & Discard patch within 12 hours or as directed by MD What changed:  when to take this reasons to take this   midodrine  10 MG tablet Commonly known as: PROAMATINE  Take 10 mg by mouth See admin instructions. Take 10 mg by mouth 30 minutes before dialysis on Tues/Thurs/Sat   naloxone  4 MG/0.1ML Liqd nasal spray kit Commonly known as: NARCAN  Place 1 spray into the nose as directed.   nitroGLYCERIN  0.4 MG SL tablet Commonly known as: NITROSTAT  Place 1 tablet (0.4 mg total) under the tongue every 5 (five) minutes x 3 doses as needed for chest pain. What changed: Another medication with the same name was removed. Continue taking this medication, and follow the directions you see here.   Oxycodone  HCl 10 MG Tabs Take 10 mg by mouth 4 (four) times daily.   Ozempic (2 MG/DOSE) 8 MG/3ML Sopn Generic drug: Semaglutide (2 MG/DOSE) Inject 2 mg into the skin once a week.   pantoprazole  40  MG tablet Commonly known as: PROTONIX  Take 40 mg by mouth daily before breakfast.   sevelamer  carbonate 800 MG tablet Commonly known as: RENVELA  Take 1,600 mg by mouth 3 (three) times daily with meals.   tiZANidine  4 MG tablet Commonly known as: ZANAFLEX  Take 1 tablet (4 mg total) by mouth daily as needed for muscle spasms. What changed: when to take this   torsemide  100 MG tablet Commonly known as: DEMADEX  Take 1 tablet (100mg ) on NON-dialysis days (Sunday, Monday, Wednesday, Friday) What changed:  medication strength how much to take how to take this when to take this additional instructions   valACYclovir  500 MG tablet Commonly known as: VALTREX  TAKE (1) TABLET BY MOUTH EVERY OTHER DAY. What changed:  See the new instructions.        Discharge Instructions: Please refer to Patient Instructions section of EMR for full details.  Patient was counseled important signs and symptoms that should prompt return to medical care, changes in medications, dietary instructions, activity restrictions, and follow up appointments.   Follow-Up Appointments:  Follow-up Information     James, [facility]. Go on 03/09/2024.   Why: Schedule is Tuesday, Thursday, Saturday with 11:30 am chair time.  On Thursday (8/28), please arrive at 10:45 am to complete paperwork prior to treatment. Contact information: [address] [place] [address] [phone]         [facility] Follow up.   Why: [name]) HHPT/OT arranged- they will contact you to schedule Contact information: [address] [place] [address] [phone]                 Alena Morrison, Elio, MD 03/08/2024, 9:29 AM PGY-1, [facility]  Upper Level Addendum: I have seen and evaluated this patient along with Dr. Idelle and reviewed the above note, making necessary revisions as appropriate. I agree with the medical decision making and physical exam  as noted above. Elyce Prescott, DO PGY-2 [facility] Family Medicine Residency

## 2024-03-08 NOTE — Progress Notes (Addendum)
 Pt verbalized understanding of discharge POC.  PIV Tele removed/CCMD called/Darrell.  Tele placed in 4E20 slot at nurse station. OT at bedside.   Will discharge to lounge/TOC pharm once OT completed.    Susmita RN updated.

## 2024-03-09 ENCOUNTER — Telehealth: Payer: Self-pay | Admitting: Nephrology

## 2024-03-09 NOTE — Telephone Encounter (Signed)
 Transition of Care - Initial Contact after Hospitalization  Date of discharge:  03/08/24 Date of contact: 03/09/24  Method: Phone Spoke to: Patient's daughter   Patient contacted to discuss transition of care from recent inpatient hospitalization. Patient was admitted to [facility] from 8/22- 03/08/24 discharge diagnosis of acute hypoxic respiratory failure   The discharge medication list was reviewed.  Patient will return to his/her outpatient HD unit on: She is currently at outpatient dialysis.   No other concerns at this time.

## 2024-03-27 ENCOUNTER — Encounter (HOSPITAL_COMMUNITY): Payer: Self-pay | Admitting: Emergency Medicine

## 2024-03-27 ENCOUNTER — Emergency Department (HOSPITAL_COMMUNITY)

## 2024-03-27 ENCOUNTER — Observation Stay (HOSPITAL_COMMUNITY)
Admission: EM | Admit: 2024-03-27 | Discharge: 2024-03-30 | Disposition: A | Discharge status: Home / Self Care | Attending: Emergency Medicine | Admitting: Emergency Medicine

## 2024-03-27 ENCOUNTER — Other Ambulatory Visit: Payer: Self-pay

## 2024-03-27 DIAGNOSIS — Z79899 Other long term (current) drug therapy: Secondary | ICD-10-CM | POA: Diagnosis not present

## 2024-03-27 DIAGNOSIS — E1122 Type 2 diabetes mellitus with diabetic chronic kidney disease: Secondary | ICD-10-CM | POA: Diagnosis not present

## 2024-03-27 DIAGNOSIS — I249 Acute ischemic heart disease, unspecified: Secondary | ICD-10-CM | POA: Diagnosis not present

## 2024-03-27 DIAGNOSIS — K219 Gastro-esophageal reflux disease without esophagitis: Secondary | ICD-10-CM | POA: Insufficient documentation

## 2024-03-27 DIAGNOSIS — N186 End stage renal disease: Secondary | ICD-10-CM | POA: Diagnosis not present

## 2024-03-27 DIAGNOSIS — J449 Chronic obstructive pulmonary disease, unspecified: Secondary | ICD-10-CM | POA: Diagnosis not present

## 2024-03-27 DIAGNOSIS — M25551 Pain in right hip: Secondary | ICD-10-CM | POA: Diagnosis present

## 2024-03-27 DIAGNOSIS — I132 Hypertensive heart and chronic kidney disease with heart failure and with stage 5 chronic kidney disease, or end stage renal disease: Secondary | ICD-10-CM | POA: Diagnosis not present

## 2024-03-27 DIAGNOSIS — D631 Anemia in chronic kidney disease: Secondary | ICD-10-CM | POA: Insufficient documentation

## 2024-03-27 DIAGNOSIS — Z7982 Long term (current) use of aspirin: Secondary | ICD-10-CM | POA: Insufficient documentation

## 2024-03-27 DIAGNOSIS — M25561 Pain in right knee: Principal | ICD-10-CM | POA: Insufficient documentation

## 2024-03-27 DIAGNOSIS — D696 Thrombocytopenia, unspecified: Secondary | ICD-10-CM | POA: Insufficient documentation

## 2024-03-27 DIAGNOSIS — M1A9XX0 Chronic gout, unspecified, without tophus (tophi): Secondary | ICD-10-CM | POA: Insufficient documentation

## 2024-03-27 DIAGNOSIS — M1990 Unspecified osteoarthritis, unspecified site: Secondary | ICD-10-CM | POA: Diagnosis not present

## 2024-03-27 DIAGNOSIS — R29898 Other symptoms and signs involving the musculoskeletal system: Secondary | ICD-10-CM | POA: Insufficient documentation

## 2024-03-27 DIAGNOSIS — G4733 Obstructive sleep apnea (adult) (pediatric): Secondary | ICD-10-CM | POA: Insufficient documentation

## 2024-03-27 DIAGNOSIS — I5031 Acute diastolic (congestive) heart failure: Secondary | ICD-10-CM | POA: Diagnosis not present

## 2024-03-27 DIAGNOSIS — M1711 Unilateral primary osteoarthritis, right knee: Principal | ICD-10-CM

## 2024-03-27 DIAGNOSIS — I350 Nonrheumatic aortic (valve) stenosis: Secondary | ICD-10-CM | POA: Diagnosis not present

## 2024-03-27 DIAGNOSIS — E785 Hyperlipidemia, unspecified: Secondary | ICD-10-CM | POA: Diagnosis not present

## 2024-03-27 DIAGNOSIS — F419 Anxiety disorder, unspecified: Secondary | ICD-10-CM | POA: Insufficient documentation

## 2024-03-27 DIAGNOSIS — Z789 Other specified health status: Secondary | ICD-10-CM

## 2024-03-27 DIAGNOSIS — Z992 Dependence on renal dialysis: Secondary | ICD-10-CM | POA: Insufficient documentation

## 2024-03-27 DIAGNOSIS — R531 Weakness: Secondary | ICD-10-CM | POA: Diagnosis not present

## 2024-03-27 LAB — COMPREHENSIVE METABOLIC PANEL WITH GFR
ALT: 24 U/L (ref 0–44)
AST: <10 U/L — ABNORMAL LOW (ref 15–41)
Albumin: 3.8 g/dL (ref 3.5–5.0)
Alkaline Phosphatase: 198 U/L — ABNORMAL HIGH (ref 38–126)
Anion gap: 20 — ABNORMAL HIGH (ref 5–15)
BUN: 106 mg/dL — ABNORMAL HIGH (ref 8–23)
CO2: 22 mmol/L (ref 22–32)
Calcium: 8.4 mg/dL — ABNORMAL LOW (ref 8.9–10.3)
Chloride: 94 mmol/L — ABNORMAL LOW (ref 98–111)
Creatinine, Ser: 8.8 mg/dL — ABNORMAL HIGH (ref 0.44–1.00)
GFR, Estimated: 5 mL/min — ABNORMAL LOW (ref >60)
Glucose, Bld: 221 mg/dL — ABNORMAL HIGH (ref 70–99)
Potassium: 4.7 mmol/L (ref 3.5–5.1)
Sodium: 136 mmol/L (ref 135–145)
Total Bilirubin: 0.5 mg/dL (ref 0.0–1.2)
Total Protein: 6.9 g/dL (ref 6.5–8.1)

## 2024-03-27 LAB — CBC WITH DIFFERENTIAL/PLATELET
Abs Immature Granulocytes: 0.05 K/uL (ref 0.00–0.07)
Basophils Absolute: 0 K/uL (ref 0.0–0.1)
Basophils Relative: 1 %
Eosinophils Absolute: 0.4 K/uL (ref 0.0–0.5)
Eosinophils Relative: 6 %
HCT: 27.6 % — ABNORMAL LOW (ref 36.0–46.0)
Hemoglobin: 8.3 g/dL — ABNORMAL LOW (ref 12.0–15.0)
Immature Granulocytes: 1 %
Lymphocytes Relative: 26 %
Lymphs Abs: 1.7 K/uL (ref 0.7–4.0)
MCH: 26.1 pg (ref 26.0–34.0)
MCHC: 30.1 g/dL (ref 30.0–36.0)
MCV: 86.8 fL (ref 80.0–100.0)
Monocytes Absolute: 0.5 K/uL (ref 0.1–1.0)
Monocytes Relative: 8 %
Neutro Abs: 3.8 K/uL (ref 1.7–7.7)
Neutrophils Relative %: 58 %
Platelets: 133 K/uL — ABNORMAL LOW (ref 150–400)
RBC: 3.18 MIL/uL — ABNORMAL LOW (ref 3.87–5.11)
RDW: 17.4 % — ABNORMAL HIGH (ref 11.5–15.5)
WBC: 6.5 K/uL (ref 4.0–10.5)
nRBC: 0 % (ref 0.0–0.2)

## 2024-03-27 NOTE — ED Triage Notes (Addendum)
 First Nurse Note: Pt presents with R hip pain that started suddenly yesterday. She denies any specific injury.

## 2024-03-27 NOTE — ED Provider Triage Note (Signed)
 Emergency Medicine Provider Triage Evaluation Note  [name] , a 68 y.o. female  was evaluated in triage.  Pt complains of right knee pain.  Patient reports that she woke up yesterday morning with right knee pain and now having difficulty bearing weight.  She states that the pain is primarily towards the posterior of the right knee.  She does also endorse that she had a fall about a month ago prior to this pain developing and was primarily having pain to the anterior surface of the knee.  She is not current on blood thinners but does report taking 81 mg of aspirin  daily.  No reported chest pain or shortness of breath.  Review of Systems  Positive: As above Negative: As above  Physical Exam  BP (!) 145/57 (BP Location: Right Arm)   Pulse 82   Temp 98.5 F (36.9 C)   Resp 18   Ht 5' 5 (1.651 m)   Wt 103.4 kg   SpO2 95%   BMI 37.94 kg/m  Gen:   Awake, no distress   Resp:  Normal effort  MSK:   Noticed palpation of the right knee primarily towards the posterior aspect of the anterior portion. Other:    Medical Decision Making  Medically screening exam initiated at 7:00 PM.  Appropriate orders placed.  [name] was informed that the remainder of the evaluation will be completed by another provider, this initial triage assessment does not replace that evaluation, and the importance of remaining in the ED until their evaluation is complete.     Dvon Jiles A, PA-C 03/27/24 1901

## 2024-03-27 NOTE — ED Triage Notes (Addendum)
 Patient c/o of inability to walk because of sudden onset of pain behind right knee yesterday morning. Pulse +1 palpated, confirmed position with doppler. Hx of fall in past 3 months.

## 2024-03-28 ENCOUNTER — Encounter (HOSPITAL_COMMUNITY): Payer: Self-pay

## 2024-03-28 ENCOUNTER — Observation Stay (HOSPITAL_COMMUNITY)

## 2024-03-28 DIAGNOSIS — R29898 Other symptoms and signs involving the musculoskeletal system: Secondary | ICD-10-CM | POA: Insufficient documentation

## 2024-03-28 DIAGNOSIS — M25561 Pain in right knee: Secondary | ICD-10-CM | POA: Diagnosis present

## 2024-03-28 LAB — SYNOVIAL CELL COUNT + DIFF, W/ CRYSTALS
Crystals, Fluid: NONE SEEN
Eosinophils-Synovial: 1 % (ref 0–1)
Lymphocytes-Synovial Fld: 43 % — ABNORMAL HIGH (ref 0–20)
Monocyte-Macrophage-Synovial Fluid: 18 % — ABNORMAL LOW (ref 50–90)
Neutrophil, Synovial: 38 % — ABNORMAL HIGH (ref 0–25)
WBC, Synovial: 27 /mm3 (ref 0–200)

## 2024-03-28 LAB — GLUCOSE, CAPILLARY: Glucose-Capillary: 269 mg/dL — ABNORMAL HIGH (ref 70–99)

## 2024-03-28 LAB — HEPATITIS B SURFACE ANTIGEN: Hepatitis B Surface Ag: NONREACTIVE

## 2024-03-28 LAB — URIC ACID: Uric Acid, Serum: 7.3 mg/dL — ABNORMAL HIGH (ref 2.5–7.1)

## 2024-03-28 MED ORDER — GABAPENTIN 100 MG PO CAPS
200.0000 mg | ORAL_CAPSULE | ORAL | Status: DC
  Administered 2024-03-28: 200 mg via ORAL
  Filled 2024-03-28 (×2): qty 2

## 2024-03-28 MED ORDER — LIDOCAINE-EPINEPHRINE (PF) 2 %-1:200000 IJ SOLN: 10.0000 mL | Freq: Once | INTRAMUSCULAR | Status: DC

## 2024-03-28 MED ORDER — INSULIN ASPART 100 UNIT/ML IJ SOLN
0.0000 [IU] | Freq: Three times a day (TID) | INTRAMUSCULAR | Status: DC
  Administered 2024-03-28 – 2024-03-29 (×2): 3 [IU] via SUBCUTANEOUS
  Administered 2024-03-29: 5 [IU] via SUBCUTANEOUS
  Administered 2024-03-30: 2 [IU] via SUBCUTANEOUS

## 2024-03-28 MED ORDER — SEVELAMER CARBONATE 800 MG PO TABS
1600.0000 mg | ORAL_TABLET | Freq: Three times a day (TID) | ORAL | Status: DC
  Administered 2024-03-29 – 2024-03-30 (×4): 1600 mg via ORAL
  Filled 2024-03-28 (×4): qty 2

## 2024-03-28 MED ORDER — HYDROXYZINE HCL 25 MG PO TABS
25.0000 mg | ORAL_TABLET | Freq: Two times a day (BID) | ORAL | Status: DC
  Administered 2024-03-28 – 2024-03-30 (×4): 25 mg via ORAL
  Filled 2024-03-28 (×4): qty 1

## 2024-03-28 MED ORDER — ACETAMINOPHEN 650 MG RE SUPP: 650.0000 mg | Freq: Four times a day (QID) | RECTAL | Status: DC | PRN

## 2024-03-28 MED ORDER — CARVEDILOL 12.5 MG PO TABS
12.5000 mg | ORAL_TABLET | Freq: Two times a day (BID) | ORAL | Status: DC
  Administered 2024-03-29 – 2024-03-30 (×3): 12.5 mg via ORAL
  Filled 2024-03-28 (×3): qty 1

## 2024-03-28 MED ORDER — CALCITRIOL 0.5 MCG PO CAPS
4.0000 ug | ORAL_CAPSULE | ORAL | Status: DC
  Filled 2024-03-28: qty 8

## 2024-03-28 MED ORDER — PANTOPRAZOLE SODIUM 40 MG PO TBEC
40.0000 mg | DELAYED_RELEASE_TABLET | Freq: Every day | ORAL | Status: DC
Start: 2024-03-29 — End: 2024-03-30
  Administered 2024-03-29 – 2024-03-30 (×2): 40 mg via ORAL
  Filled 2024-03-28 (×2): qty 1

## 2024-03-28 MED ORDER — SENNA 8.6 MG PO TABS
1.0000 | ORAL_TABLET | Freq: Two times a day (BID) | ORAL | Status: DC
  Administered 2024-03-29: 8.6 mg via ORAL
  Filled 2024-03-28 (×3): qty 1

## 2024-03-28 MED ORDER — OXYCODONE-ACETAMINOPHEN 5-325 MG PO TABS
1.0000 | ORAL_TABLET | Freq: Once | ORAL | Status: AC
  Administered 2024-03-28: 1 via ORAL
  Filled 2024-03-28: qty 1

## 2024-03-28 MED ORDER — ATORVASTATIN CALCIUM 80 MG PO TABS
80.0000 mg | ORAL_TABLET | Freq: Every day | ORAL | Status: DC
  Administered 2024-03-29 – 2024-03-30 (×2): 80 mg via ORAL
  Filled 2024-03-28 (×2): qty 1

## 2024-03-28 MED ORDER — OXYCODONE HCL 5 MG PO TABS
10.0000 mg | ORAL_TABLET | Freq: Four times a day (QID) | ORAL | Status: DC
  Administered 2024-03-28 – 2024-03-29 (×2): 10 mg via ORAL
  Filled 2024-03-28 (×2): qty 2

## 2024-03-28 MED ORDER — ACETAMINOPHEN 325 MG PO TABS: 650.0000 mg | ORAL_TABLET | Freq: Four times a day (QID) | ORAL | Status: DC | PRN

## 2024-03-28 MED ORDER — HEPARIN SODIUM (PORCINE) 5000 UNIT/ML IJ SOLN
5000.0000 [IU] | Freq: Three times a day (TID) | INTRAMUSCULAR | Status: DC
  Administered 2024-03-28 – 2024-03-29 (×4): 5000 [IU] via SUBCUTANEOUS
  Filled 2024-03-28 (×5): qty 1

## 2024-03-28 MED ORDER — CHLORHEXIDINE GLUCONATE CLOTH 2 % EX PADS
6.0000 | MEDICATED_PAD | Freq: Every day | CUTANEOUS | Status: DC
Start: 2024-03-29 — End: 2024-03-30
  Administered 2024-03-30: 6 via TOPICAL

## 2024-03-28 MED ORDER — MIDODRINE HCL 5 MG PO TABS: 10.0000 mg | ORAL_TABLET | ORAL | Status: DC

## 2024-03-28 MED ORDER — LIDOCAINE HCL (PF) 1 % IJ SOLN
INTRAMUSCULAR | Status: AC
  Filled 2024-03-28: qty 30

## 2024-03-28 MED ORDER — HYDROMORPHONE HCL 2 MG PO TABS
1.0000 mg | ORAL_TABLET | Freq: Once | ORAL | Status: AC
  Administered 2024-03-28: 1 mg via ORAL
  Filled 2024-03-28: qty 1

## 2024-03-28 MED ORDER — ASPIRIN 81 MG PO TBEC
81.0000 mg | DELAYED_RELEASE_TABLET | Freq: Every day | ORAL | Status: DC
  Administered 2024-03-28 – 2024-03-30 (×3): 81 mg via ORAL
  Filled 2024-03-28 (×3): qty 1

## 2024-03-28 MED ORDER — TORSEMIDE 20 MG PO TABS
100.0000 mg | ORAL_TABLET | ORAL | Status: DC
  Administered 2024-03-29: 100 mg via ORAL
  Filled 2024-03-28: qty 5

## 2024-03-28 MED ORDER — POLYETHYLENE GLYCOL 3350 17 G PO PACK: 17.0000 g | PACK | Freq: Every day | ORAL | Status: DC | PRN

## 2024-03-28 MED ORDER — KETOROLAC TROMETHAMINE 60 MG/2ML IM SOLN
30.0000 mg | Freq: Once | INTRAMUSCULAR | Status: AC
  Administered 2024-03-28: 30 mg via INTRAMUSCULAR
  Filled 2024-03-28: qty 2

## 2024-03-28 MED ORDER — MIDODRINE HCL 5 MG PO TABS
10.0000 mg | ORAL_TABLET | ORAL | Status: DC
Start: 2024-03-30 — End: 2024-03-30

## 2024-03-28 NOTE — Assessment & Plan Note (Addendum)
 T2DM: CBG x 4 w/ SSI, hold Ozempic 2 mg weekly Essential HTN: Holding Amlodipine  10 mg tab on non dialysis day in the setting of lower BP and continuing Coreg  12.5 mg BID w/ meal GERD: Continue Protonix  40 mg daily Anxiety: Continue Atarax  25 mg BID  Asthma: Add Albuterol  prn Acute diastolic CHF: Torsemide  100 MG on non-dialysis day HLD: Continue Lipitor  80 mg daily  Osteoarthritis: Oxycodone  10 mg TID, holding tiZANidine  4 mg daily PRN in the setting of Qtc prolongation previously, will repeat EKG Chronic gout due to renal impairment Aortic stenosis : ASA 81 mg daily ESRD (end stage renal disease): Continue T/Th/S HD, continue Renvela  800 mg TID, continue midodrine  10 mg PO 30 mins before iHD COPD: Pt was unaware of diagnosis OSA: Will check if she wears a CPAP

## 2024-03-28 NOTE — ED Notes (Signed)
 R knee painted with betadine 

## 2024-03-28 NOTE — ED Provider Notes (Incomplete)
 Patient with multiple medical problems including end-stage renal disease on dialysis, gout, osteoarthritis, obesity who is presenting today with complaint of knee pain without known trauma.  Knee pain is diffuse and initially it was thought may be slightly warm.  Patient reports she had been feeling slightly chilled but has not had documented fever here.  Last dialysis was on Saturday.  She is supposed to have dialysis today at 1030.  Patient did come in yesterday and has been waiting 18 hours.  She does have pain with bending her knee.  X-Kretsch showed significant arthritis with some joint space narrowing and an osteophyte which could be the cause for her pain but will also evaluate for gout or septic joint.  Labs are reassuring other than elevated anion gap and creatinine suspect related to need for dialysis. No signs of septic arthritis but pt still having significant pain. .Joint Aspiration/Arthrocentesis  Date/Time: 03/29/2024 7:05 AM  Performed by: Doretha Folks, MD Authorized by: Doretha Folks, MD   Consent:    Consent obtained:  Verbal   Consent given by:  Patient   Risks, benefits, and alternatives were discussed: yes     Risks discussed:  Pain Universal protocol:    Procedure explained and questions answered to patient or proxy's satisfaction: yes     Imaging studies available: yes     Immediately prior to procedure, a time out was called: yes     Patient identity confirmed:  Verbally with patient Location:    Location:  Knee   Knee:  R knee Anesthesia:    Anesthesia method:  Local infiltration   Local anesthetic:  Lidocaine  1% w/o epi Procedure details:    Preparation: Patient was prepped and draped in usual sterile fashion     Needle gauge:  18 G   Ultrasound guidance: yes     Approach:  Medial   Aspirate amount:  20   Aspirate characteristics:  Blood-tinged and clear   Steroid injected: no     Specimen collected: yes   Post-procedure details:    Dressing:  Adhesive  bandage   Procedure completion:  [place] Doretha Folks, MD 03/29/24 [phone]

## 2024-03-28 NOTE — Progress Notes (Addendum)
 Contacted by ED provider with request that pt's out-pt HD clinic be contacted regarding an appt for pt tomorrow since pt missed HD appt today. Contacted [facility] and advised that pt can receive treatment tomorrow. Pt will need to arrive at 7:40 am for 8:00 am chair time. This info was provided to ED provider who plans to discuss with pt. Will assist as needed.   Randine Mungo Dialysis Navigator [phone]  Addendum at 4:20 pm: HD appt for tomorrow added to AVS.

## 2024-03-28 NOTE — ED Notes (Signed)
 Patient transported to MRI

## 2024-03-28 NOTE — Evaluation (Signed)
 Physical Therapy Evaluation Patient Details Name: [name] MRN: [id number] DOB: 1956/03/14 Today's Date: 03/28/2024  History of Present Illness  68 y.o. female presents to [facility] hospital on 03/27/2024 with acute R knee pain. PMH: HFpEF, ESRD, HTN, CAD, DM  Clinical Impression  Pt presents to PT with deficits in functional mobility, gait, balance, sensation, strength. Upon PT arrival pt wants to clarify that she is not here at the hospital for knee pain, but rather for new onset RLE weakness and numbness. PT notes decreased sensation and weakness compared to LLE as documented below. Pt is able to stand and ambulate for short household distances at this time. PT provides HEP for hopeful improvement in RLE muscle activation. HHPT recommended pending continued progression.        If plan is discharge home, recommend the following: A little help with bathing/dressing/bathroom;Assistance with cooking/housework;Assist for transportation;Help with stairs or ramp for entrance   Can travel by private vehicle   Yes    Equipment Recommendations Rolling walker (2 wheels);BSC/3in1  Recommendations for Other Services       Functional Status Assessment Patient has had a recent decline in their functional status and demonstrates the ability to make significant improvements in function in a reasonable and predictable amount of time.     Precautions / Restrictions Precautions Precautions: Fall Recall of Precautions/Restrictions: Intact Restrictions Weight Bearing Restrictions Per Provider Order: No      Mobility  Bed Mobility Overal bed mobility: Needs Assistance Bed Mobility: Supine to Sit     Supine to sit: Supervision, HOB elevated Sit to supine: Min assist   General bed mobility comments: assistance provided for LLE to return to supine, no assistance for RLE    Transfers Overall transfer level: Needs assistance Equipment used: Rolling walker (2 wheels) Transfers: Sit to/from  Stand Sit to Stand: Contact guard assist, From elevated surface                Ambulation/Gait Ambulation/Gait assistance: Contact guard assist Gait Distance (Feet): 20 Feet Assistive device: Rolling walker (2 wheels) Gait Pattern/deviations: Step-to pattern, Decreased dorsiflexion - right, Decreased weight shift to right Gait velocity: reduced Gait velocity interpretation: <1.8 ft/sec, indicate of risk for recurrent falls   General Gait Details: pt with slowed step-to gait, reduced DF in RLE as R foot drags behind. pt with significant increase in effort to advance RLE.  Stairs            Wheelchair Mobility     Tilt Bed    Modified Rankin (Stroke Patients Only)       Balance Overall balance assessment: Needs assistance Sitting-balance support: No upper extremity supported, Feet supported Sitting balance-Leahy Scale: Good     Standing balance support: Bilateral upper extremity supported, Reliant on assistive device for balance Standing balance-Leahy Scale: Poor                               Pertinent Vitals/Pain Pain Assessment Pain Assessment: 0-10 Pain Score: 7  Pain Location: knees Pain Descriptors / Indicators: Aching Pain Intervention(s): Monitored during session    Home Living Family/patient expects to be discharged to:: Private residence Living Arrangements: Other relatives Available Help at Discharge: Family;Available 24 hours/day Type of Home: House Home Access: Stairs to enter Entrance Stairs-Rails: None Entrance Stairs-Number of Steps: 1   Home Layout: Two level;Able to live on main level with bedroom/bathroom (half bath downstairs) Home Equipment: Cane - quad  Prior Function Prior Level of Function : Needs assist             Mobility Comments: ambulatory with quad cane at baseline ADLs Comments: Pt reporting grandchildren assist with LB ADLs     Extremity/Trunk Assessment   Upper Extremity Assessment Upper  Extremity Assessment: Overall WFL for tasks assessed    Lower Extremity Assessment Lower Extremity Assessment: RLE deficits/detail;LLE deficits/detail RLE Deficits / Details: ankle DF lacking 5 degrees or less from neutral, knee ROM WFL, strength grossly 3+/5 with formal assessment although inconsistencies noted in hip flexion and knee extension with functional mobility RLE Sensation: decreased light touch;history of peripheral neuropathy (pt with significant loss of sensation to R foot, dulled sensation to R shin/thigh area compared to LLE) LLE Deficits / Details: ROM WFL, strength grossly 4/5 LLE Sensation: decreased light touch;history of peripheral neuropathy    Cervical / Trunk Assessment Cervical / Trunk Assessment: Kyphotic  Communication   Communication Communication: No apparent difficulties    Cognition Arousal: Alert Behavior During Therapy: WFL for tasks assessed/performed   PT - Cognitive impairments: No apparent impairments                         Following commands: Intact       Cueing Cueing Techniques: Verbal cues     General Comments General comments (skin integrity, edema, etc.): VSS on RA    Exercises General Exercises - Lower Extremity Ankle Circles/Pumps: AROM, Both, 5 reps Heel Slides: AROM, Right, 5 reps Straight Leg Raises: AROM, Right, 5 reps   Assessment/Plan    PT Assessment Patient needs continued PT services  PT Problem List Decreased strength;Decreased activity tolerance;Decreased mobility;Decreased balance;Decreased knowledge of use of DME;Impaired sensation;Pain       PT Treatment Interventions DME instruction;Gait training;Stair training;Functional mobility training;Therapeutic activities;Therapeutic exercise;Balance training;Neuromuscular re-education;Patient/family education    PT Goals (Current goals can be found in the Care Plan section)  Acute Rehab PT Goals Patient Stated Goal: to return to independence, improve RLE  strength PT Goal Formulation: With patient Time For Goal Achievement: 04/11/24 Potential to Achieve Goals: Good    Frequency Min 3X/week     Co-evaluation               AM-PAC PT 6 Clicks Mobility  Outcome Measure Help needed turning from your back to your side while in a flat bed without using bedrails?: None Help needed moving from lying on your back to sitting on the side of a flat bed without using bedrails?: A Little Help needed moving to and from a bed to a chair (including a wheelchair)?: A Little Help needed standing up from a chair using your arms (e.g., wheelchair or bedside chair)?: A Little Help needed to walk in hospital room?: A Little Help needed climbing 3-5 steps with a railing? : A Lot 6 Click Score: 18    End of Session Equipment Utilized During Treatment: Gait belt Activity Tolerance: Patient tolerated treatment well Patient left: in bed;with call bell/phone within reach Nurse Communication: Mobility status PT Visit Diagnosis: Other abnormalities of gait and mobility (R26.89);Muscle weakness (generalized) (M62.81) Pain - part of body: Knee    Time: 8280-8257 PT Time Calculation (min) (ACUTE ONLY): 23 min   Charges:   PT Evaluation $PT Eval Low Complexity: 1 Low   PT General Charges $$ ACUTE PT VISIT: 1 Visit         Bernardino JINNY Ruth, PT, DPT Acute Rehabilitation Office [phone]  Bernardino JINNY Ruth 03/28/2024, 6:01 PM

## 2024-03-28 NOTE — ED Notes (Signed)
 EDP at [facility]

## 2024-03-28 NOTE — ED Provider Notes (Signed)
 [place] EMERGENCY DEPARTMENT AT [facility] Provider Note   CSN: [id number] Arrival date & time: 03/27/24  1737     Patient presents with: Hip Pain and Knee Pain   [name] is a 68 y.o. female with PMH of HTN, diabetes, GERD, chronic diastolic heart failure, obesity, ESRD on dialysis TTS who presents with acute pain in her right knee.  The pain has been going on for 2 days now.  It started suddenly on Sunday without any traumatic injury or incident.  She states that she has not had any pain like this in the past.  Her pain is 10 out of 10 today.  She has difficulty moving her right knee.  She feels weak in her right knee but is unsure whether it is coming from pain or true weakness.  She has difficulty putting weight on her right knee.  Her pain is exacerbated with any movements of her right knee.  She did take 1 Percocet on Sunday which did not help much.  She says that she feels chilled as well and this is abnormal for her.  She is scheduled for dialysis today.  She denies chest pain, shortness of breath, nausea, vomiting, changes in her bowel or bladder habits.  She does have some associated back pain that is about baseline for her.  She has no other joint pains.  She states that she is awaiting knee replacement of the right side due to her osteoarthritis.     Prior to Admission medications   Medication Sig Start Date End Date Taking? Authorizing Provider  albuterol  (VENTOLIN  HFA) 108 (90 Base) MCG/ACT inhaler Inhale 2 puffs into the lungs every 4 (four) hours as needed for wheezing or shortness of breath. 06/28/20   Vivienne Delon CHRISTELLA, PA-C  amLODipine  (NORVASC ) 10 MG tablet Take 10 mg by mouth See admin instructions. Take one tablet by mouth on Monday, Wednesdays, Fridays, and Sundays only    [provider]  ammonium lactate (LAC-HYDRIN) 12 % lotion Apply 1 Application topically daily. 12/15/23   [provider]  aspirin  81 MG EC tablet Take 1 tablet (81  mg total) by mouth daily. 09/20/20   Singh, Prashant K, MD  atorvastatin  (LIPITOR ) 80 MG tablet Take 1 tablet (80 mg total) by mouth every evening. Patient taking differently: Take 80 mg by mouth daily. 04/24/21   Bacigalupo, Angela M, MD  buprenorphine  (BUTRANS ) 15 MCG/HR Place 1 patch onto the skin once a week. Sundays 12/24/23   [provider]  carvedilol  (COREG ) 12.5 MG tablet Take 1 tablet (12.5 mg total) by mouth 2 (two) times daily with a meal. 09/19/21   Paige, Richerd PARAS, DO  Continuous Blood Gluc Receiver (FREESTYLE LIBRE 14 DAY READER) DEVI To check blood sugar ACHS 09/27/20   Vivienne Delon CHRISTELLA, PA-C  Continuous Blood Gluc Sensor (FREESTYLE LIBRE 14 DAY SENSOR) MISC To check blood sugar ACHS; change every 14 days 09/27/20   Vivienne Delon CHRISTELLA, PA-C  fluticasone  (FLONASE ) 50 MCG/ACT nasal spray Place 2 sprays into both nostrils daily as needed for allergies or rhinitis. 09/28/17   Vivienne Delon CHRISTELLA, PA-C  fluticasone  furoate-vilanterol (BREO ELLIPTA ) 200-25 MCG/INH AEPB Inhale 1 puff into the lungs daily. 08/30/20   Burnette, Jennifer M, PA-C  gabapentin  (NEURONTIN ) 100 MG capsule Please take 2 capsules (200 mg) after dialysis on dialysis days only Tuesday, Thursday, Saturday. 03/09/24   Everhart, Kirstie, DO  Glucose Blood (BLOOD GLUCOSE TEST STRIPS 333) STRP Enough test stripes to  test blood sugar TID x 30 days. No refills 04/16/22   Trudy Anthony HERO, MD  hydrOXYzine  (ATARAX ) 25 MG tablet Take 1 tablet (25 mg total) by mouth 2 (two) times daily. 11/17/22   Hongalgi, Anand D, MD  Insulin  Pen Needle 32G X 4 MM MISC Enough pen needles for 100 units of novolin  QAM x 30 days. No refills 04/16/22   Trudy Anthony HERO, MD  lidocaine  (LIDODERM ) 5 % Place 1 patch onto the skin daily. Remove & Discard patch within 12 hours or as directed by MD Patient taking differently: Place 1 patch onto the skin daily as needed (for pain). Remove & Discard patch within 12 hours or as directed by MD  06/25/23   Nettie, April, MD  midodrine  (PROAMATINE ) 10 MG tablet Take 10 mg by mouth See admin instructions. Take 10 mg by mouth 30 minutes before dialysis on Tues/Thurs/Sat 12/23/21   [provider]  naloxone  (NARCAN ) nasal spray 4 mg/0.1 mL Place 1 spray into the nose as directed. 04/08/22   [provider]  nitroGLYCERIN  (NITROSTAT ) 0.4 MG SL tablet Place 1 tablet (0.4 mg total) under the tongue every 5 (five) minutes x 3 doses as needed for chest pain. 03/08/24   Howell Lunger, DO  Oxycodone  HCl 10 MG TABS Take 10 mg by mouth 4 (four) times daily. 04/01/22   [provider]  OZEMPIC, 2 MG/DOSE, 8 MG/3ML SOPN Inject 2 mg into the skin once a week.    [provider]  pantoprazole  (PROTONIX ) 40 MG tablet Take 40 mg by mouth daily before breakfast.    [provider]  sevelamer  carbonate (RENVELA ) 800 MG tablet Take 1,600 mg by mouth 3 (three) times daily with meals. 12/31/23   [provider]  tiZANidine  (ZANAFLEX ) 4 MG tablet Take 1 tablet (4 mg total) by mouth daily as needed for muscle spasms. 03/08/24   Everhart, Kirstie, DO  torsemide  (DEMADEX ) 100 MG tablet Take 1 tablet (100mg ) on NON-dialysis days (Sunday, Monday, Wednesday, Friday) 03/08/24   Everhart, Kirstie, DO  valACYclovir  (VALTREX ) 500 MG tablet TAKE (1) TABLET BY MOUTH EVERY OTHER DAY. Patient taking differently: Take 500 mg by mouth daily. 01/24/21   Myrla Jon HERO, MD    Allergies: Shellfish allergy, Diazepam , and Morphine     Review of Systems  Updated Vital Signs BP (!) 147/74   Pulse 74   Temp (!) 97.4 F (36.3 C) (Oral)   Resp 16   Ht 5' 5 (1.651 m)   Wt 103.4 kg   SpO2 100%   BMI 37.94 kg/m   Physical Exam Constitutional:      Comments: Uncomfortable  Cardiovascular:     Rate and Rhythm: Normal rate and regular rhythm.     Heart sounds: Murmur heard.  Musculoskeletal:     Right knee: Swelling and effusion present. No deformity or erythema. Decreased  range of motion. Tenderness present over the medial joint line and lateral joint line. No LCL laxity, MCL laxity, ACL laxity or PCL laxity. Normal alignment, normal meniscus and normal patellar mobility. Normal pulse.     Instability Tests: Anterior drawer test negative. Posterior drawer test negative.     Left knee: No swelling, deformity, effusion or erythema. Normal range of motion. No tenderness.     Comments: Warmth of the right knee compared to the left.  Minor swelling.  Extreme difficulty getting from wheelchair to bed.  Reportedly much different than patient's baseline.  Neurological:     General: No focal  deficit present.     Mental Status: She is alert.     Comments: Dysesthesia of right lower extremity compared to left.     (all labs ordered are listed, but only abnormal results are displayed) Labs Reviewed  CBC WITH DIFFERENTIAL/PLATELET - Abnormal; Notable for the following components:      Result Value   RBC 3.18 (*)    Hemoglobin 8.3 (*)    HCT 27.6 (*)    RDW 17.4 (*)    Platelets 133 (*)    All other components within normal limits  COMPREHENSIVE METABOLIC PANEL WITH GFR - Abnormal; Notable for the following components:   Chloride 94 (*)    Glucose, Bld 221 (*)    BUN 106 (*)    Creatinine, Ser 8.80 (*)    Calcium  8.4 (*)    AST <10 (*)    Alkaline Phosphatase 198 (*)    GFR, Estimated 5 (*)    Anion gap 20 (*)    All other components within normal limits  SYNOVIAL CELL COUNT + DIFF, W/ CRYSTALS - Abnormal; Notable for the following components:   Color, Synovial ORANGE (*)    Appearance-Synovial CLOUDY (*)    Neutrophil, Synovial 38 (*)    Lymphocytes-Synovial Fld 43 (*)    Monocyte-Macrophage-Synovial Fluid 18 (*)    All other components within normal limits  BODY FLUID CULTURE W GRAM STAIN    EKG: None  Radiology: DG Knee Complete 4 Views Right Result Date: 03/27/2024 EXAM: 4 or more VIEW(S) XRAY OF THE RIGHT KNEE 03/27/2024 07:42:00 PM COMPARISON:  None available. CLINICAL HISTORY: Knee pain. Reason for exam: knee pain ; Per triage notes: Patient c/o of inability to walk because of sudden onset of pain behind right knee yesterday morning. Pulse +1 palpated, confirmed position with doppler. Hx of fall in past 3 months. FINDINGS: BONES AND JOINTS: No acute fracture. No focal osseous lesion. No joint dislocation. No significant joint effusion. Three compartmental osteoarthritis, with moderate joint space narrowing and osteophyte formation in the medial compartment. SOFT TISSUES: The soft tissues are unremarkable. IMPRESSION: 1. No acute findings. 2. Three compartmental osteoarthritis, with moderate joint space narrowing and osteophyte formation in the medial compartment. Electronically signed by: Dorethia Molt MD 03/27/2024 08:10 PM EDT RP Workstation: [id number]    .Joint Aspiration/Arthrocentesis  Date/Time: 03/28/2024 11:35 AM  Performed by: Napoleon Limes, MD Authorized by: Doretha Folks, MD   Consent:    Consent obtained:  Verbal   Consent given by:  Patient   Risks, benefits, and alternatives were discussed: yes     Risks discussed:  Bleeding, infection and pain   Alternatives discussed:  No treatment Universal protocol:    Procedure explained and questions answered to patient or proxy's satisfaction: yes     Site/side marked: yes     Patient identity confirmed:  Verbally with patient and arm band Location:    Location:  Knee   Knee:  R knee Anesthesia:    Anesthesia method:  Local infiltration   Local anesthetic:  Lidocaine  1% w/o epi Procedure details:    Preparation: Patient was prepped and draped in usual sterile fashion     Needle gauge:  18 G   Ultrasound guidance: no     Approach:  Medial   Aspirate amount:    Aspirate characteristics:  Blood-tinged and yellow   Steroid injected: no     Specimen collected: yes   Post-procedure details:    Dressing:  Adhesive bandage  Procedure completion:  Tolerated  well, no immediate complications    Medications Ordered in the ED  lidocaine  (PF) (XYLOCAINE ) 1 % injection (  Given by Other 03/28/24 1056)  oxyCODONE -acetaminophen  (PERCOCET/ROXICET) 5-325 MG per tablet 1 tablet (1 tablet Oral Given 03/28/24 1131)    Clinical Course as of 03/28/24 1526  Tue Mar 28, 2024  1100 Patient underwent aspiration of the right knee.  Please see procedures tab. [NS]  1437 Synovial cell count + diff, w/ crystals(!) No evidence of septic joint or gout.  Likely osteoarthritic process.  Will try and coordinate her dialysis session  [NS]  1523 Patient with continued pain, stating she is unable to walk.  Will give Dilaudid  and Toradol  and reevaluate [NS]    Clinical Course User Index [NS] Napoleon Limes, MD                                 Medical Decision Making Amount and/or Complexity of Data Reviewed Labs: ordered.  Risk Prescription drug management.   This patient presents to the ED for concern of acute right knee pain, this involves an extensive number of treatment options, and is a complaint that carries with it a high risk of complications and morbidity.  The differential diagnosis includes osteoarthritis flare, acute gout, septic arthritis.  Her right knee was slightly warmer than her left knee and the patient has been reported subjective chills.  Given the acute onset of her pain without any trauma, there was concern for an inflammatory process. Patient underwent aspiration of the right knee which showed WBCs of 27.  No crystals no organisms seen on Gram stain.  It is likely that her knee pain is just to a flare of her osteoarthritis.  There was an osteophyte seen on her x-Allums along with severe tricompartmental arthritis.  She was unable to attend her dialysis session today due to workup at the emergency department and long wait time.  She did receive Percocet while she was here with mild improvement of her symptoms.  On reevaluation, the patient stated that  she did not want to leave as she still did not believe that she would be able to ambulate with her current level of pain.  She normally ambulates with a cane but does not think she will be able to do this at home.  She received Toradol  and Dilaudid .  She was pending ambulation at the time of my handoff.  She was deemed safe for his discharge pending ambulatory trial.   Co morbidities that complicate the patient evaluation  ESRD, CHF, Morbid obesity    Lab Tests:  I Ordered, and personally interpreted labs.  The pertinent results include: Creatinine of 8.8 and BUN of 106, consistent with someone in need of dialysis.  Hemoglobin 8.3, at baseline.  No leukocytosis.  Gram stain and cell count showed no inflammatory process.  No leukocytosis, crystals, organisms.   Imaging Studies ordered:  I ordered imaging studies including right knee x-Pieratt  I independently visualized and interpreted imaging which showed tricompartmental mental osteoarthritis with bone spur I agree with the radiologist interpretation    Problem List / ED Course / Critical interventions / Medication management  Right knee pain I ordered medication including Percocet, Dilaudid , and Toradol  for right knee pain  Reevaluation of the patient after these medicines showed that the patient stayed the same I have reviewed the patients home medicines and have made adjustments as needed  Test / Admission - Considered:    We have given her Toradol  and Dilaudid  and will see if she be able to walk with this medication regimen.  She was pending ambulatory trial at time of handoff.  Final diagnoses:  None    ED Discharge Orders     None          Napoleon Limes, MD 03/28/24 [id number]    Doretha Folks, MD 03/29/24 [id number]

## 2024-03-28 NOTE — Assessment & Plan Note (Signed)
 ESRD on HD Tu/Th/Sat. Missed HD 9/16. - Nephrology consulted, appreciated recommendations - Plan for HD this evening as able

## 2024-03-28 NOTE — ED Notes (Signed)
 Pt in xray

## 2024-03-28 NOTE — Plan of Care (Signed)

## 2024-03-28 NOTE — ED Notes (Signed)
 Back from xray

## 2024-03-28 NOTE — ED Notes (Signed)
 5C aware pt will head to floor after MRI.

## 2024-03-28 NOTE — Consult Note (Addendum)
 Reason for Consult: ESRD Referring Physician:  Dr. Lynwood Moulder  Chief Complaint: right hip pain  Home bp meds: Coreg  12.5 bid Demadex  10mg  qam NTG patch Midodrine  10mg  pre hd TTS   OP HD: [facility]  HD TTS 2/2 4.5h  99 kg   B450    L AVF   Heparin  3000  Mircera 120mcg q2weeks (last 75 given on 9/2) Sensipar  60 three times per week (last given 8/19) Hectorol  5mcg IV TIW  Assessment/Plan: Right knee pain ruled out for gout and septic Tritus thought to be secondary to osteoarthritis.  She is having difficulty putting weight on her right leg because of the knee pain.  ESRD: on HD T[facility] at [facility] last treatment on Friday leaving approximately 1.4 kg above her EDW.  Has a history of signing off early usually approximately 1 time per week. She had a treatment Fri because of a birthday weekend. - Plan is for HD 1st shift in AM o/w it'll be late over night and no absolute indication.   Additional recommendations - Dose all meds for creatinine clearance < 10 ml/min  - Unless absolutely necessary, no MRIs with gadolinium.  - Prefer needle sticks in the dorsum of the hands or wrists.  No blood pressure measurements in right arm. - If blood transfusion is requested during hemodialysis sessions, please alert us  prior to the session.  - If a hemodialysis catheter line culture is requested, please alert us  as only hemodialysis nurses are able to collect those specimens.   Avoid nephrotoxic medications including NSAIDs and iodinated intravenous contrast exposure unless the latter is absolutely indicated.  Preferred narcotic agents for pain control are hydromorphone , fentanyl , and methadone. Morphine  should not be used. Avoid Baclofen and avoid oral sodium phosphate  and magnesium  citrate based laxatives / bowel preps. Continue strict Input and Output monitoring. Will monitor the patient closely with you and intervene or adjust therapy as indicated by changes in clinical status/labs   HTN: on amlodipine  non  dialysis days, coreg  12.5 mg twice daily  BP: chronic hypotension, takes midodrine  10mg  pre HD, will cont here.  Volume: as above Anemia of esrd: Hb 8.3, follow.  Transfuse if under 7 Lumbar radiculopathy with MRI requested; primary care team ruling out cauda equina DM SSI with CBG Combined CHF: Received torsemide  100 mg on nondialysis days OSA   HPI: [name] is an 68 y.o. female with a history of aortic stenosis, coronary atherosclerotic heart disease, combined systolic and diastolic heart failure, diabetes, hyperlipidemia, OSA, HCV, refuses blood transfusions as a Jehovah's Witness, ESRD dialyzed on Tuesdays Thursdays and Saturdays at [facility] with last dialysis treatment there on 9/12; it appears that she received an extra treatment on Friday instead of Saturday leaving at 100.4 kg which is approximately 1-1/2 kg above her EDW.  Patient has a history of large fluid gains usually at least 3-4 kg between treatments.  Last hemoglobin on 9/11 was 7.8.  She does sign off early on occasions usually once every week with only a 2-hour 37-minute treatment on 9/11 and just under 4 hours on 9/6.  Patient is here with severe pain in the right  knee with knee tap ruling out gout and septic arthritis but admitted because of difficulty ambulating dragging her leg due to severe pain.  She denies any fever, chills, myalgias.  Does have known osteoarthritis at home and is on oxycodone .  Plain films showed moderate joint space narrowing and osteophyte formation.  Pain is worse with movement  and patient also has a known history of gout.  ROS Pertinent items are noted in HPI.  Chemistry and CBC: Creat  Date/Time Value Ref Range Status  09/23/2016 02:51 PM 1.36 (H) 0.50 - 1.05 mg/dL Final    Comment:      For patients > or = 66 years of age: The upper reference limit for Creatinine is approximately 13% higher for people identified as African-American.     09/01/2016 11:51 AM 1.44 (H) 0.50 - 1.05 mg/dL  Final    Comment:      For patients > or = 66 years of age: The upper reference limit for Creatinine is approximately 13% higher for people identified as African-American.     07/27/2016 10:20 AM 2.02 (H) 0.50 - 1.05 mg/dL Final    Comment:      For patients > or = 66 years of age: The upper reference limit for Creatinine is approximately 13% higher for people identified as African-American.     05/19/2016 10:40 AM 1.90 (H) 0.50 - 1.05 mg/dL Final    Comment:      For patients > or = 66 years of age: The upper reference limit for Creatinine is approximately 13% higher for people identified as African-American.     11/25/2015 10:47 AM 1.75 (H) 0.50 - 1.05 mg/dL Final  87/84/7983 96:91 PM 1.39 (H) 0.50 - 1.05 mg/dL Final  89/82/7983 88:90 AM 1.73 (H) 0.50 - 1.05 mg/dL Final  90/87/7983 98:93 PM 1.47 (H) 0.50 - 1.05 mg/dL Final  90/98/7983 89:40 AM 1.32 (H) 0.50 - 1.05 mg/dL Final  92/78/7983 96:78 PM 1.46 (H) 0.50 - 1.10 mg/dL Final  95/71/7983 89:41 AM 1.47 (H) 0.50 - 1.10 mg/dL Final   Creatinine, Ser  Date/Time Value Ref Range Status  03/27/2024 07:12 PM 8.80 (H) 0.44 - 1.00 mg/dL Final  91/73/7974 94:76 AM 8.35 (H) 0.44 - 1.00 mg/dL Final  91/74/7974 96:67 AM 6.91 (H) 0.44 - 1.00 mg/dL Final  91/75/7974 95:96 AM 5.37 (H) 0.44 - 1.00 mg/dL Final  91/76/7974 96:54 AM 6.90 (H) 0.44 - 1.00 mg/dL Final  91/77/7974 98:63 PM 9.61 (H) 0.44 - 1.00 mg/dL Final  91/77/7974 90:83 AM 9.26 (H) 0.44 - 1.00 mg/dL Final  91/77/7974 91:57 AM 9.50 (H) 0.44 - 1.00 mg/dL Final  87/86/7975 98:93 AM 9.66 (H) 0.44 - 1.00 mg/dL Final  94/92/7975 98:83 AM 7.13 (H) 0.44 - 1.00 mg/dL Final  94/93/7975 95:90 AM 10.48 (H) 0.44 - 1.00 mg/dL Final  94/94/7975 90:51 AM 9.26 (H) 0.44 - 1.00 mg/dL Final  94/95/7975 89:42 PM 8.48 (H) 0.44 - 1.00 mg/dL Final  95/78/7975 92:77 AM 4.74 (H) 0.44 - 1.00 mg/dL Final  95/80/7975 97:44 AM 5.75 (H) 0.44 - 1.00 mg/dL Final  95/81/7975 97:66 AM 8.00 (H) 0.44 -  1.00 mg/dL Final  95/82/7975 97:46 AM 6.51 (H) 0.44 - 1.00 mg/dL Final  95/83/7975 97:57 AM 8.91 (H) 0.44 - 1.00 mg/dL Final  95/84/7975 87:43 AM 7.84 (H) 0.44 - 1.00 mg/dL Final  95/86/7975 96:92 AM 7.75 (H) 0.44 - 1.00 mg/dL Final  95/87/7975 92:84 AM 5.61 (H) 0.44 - 1.00 mg/dL Final  95/88/7975 97:40 AM 8.44 (H) 0.44 - 1.00 mg/dL Final  95/89/7975 96:70 AM 12.62 (H) 0.44 - 1.00 mg/dL Final  95/90/7975 96:54 PM 13.30 (H) 0.44 - 1.00 mg/dL Final  95/90/7975 97:64 PM 12.18 (H) 0.44 - 1.00 mg/dL Final  98/85/7975 92:66 AM 7.19 (H) 0.44 - 1.00 mg/dL Final  98/86/7975 94:99 AM 10.47 (H) 0.44 - 1.00  mg/dL Final  98/87/7975 94:50 AM 8.54 (H) 0.44 - 1.00 mg/dL Final  98/88/7975 93:54 AM 12.64 (H) 0.44 - 1.00 mg/dL Final  98/89/7975 89:86 PM 12.47 (H) 0.44 - 1.00 mg/dL Final  88/85/7976 88:99 AM 10.75 (H) 0.44 - 1.00 mg/dL Final  89/94/7976 93:78 AM 6.82 (H) 0.44 - 1.00 mg/dL Final  89/95/7976 97:65 PM 5.53 (H) 0.44 - 1.00 mg/dL Final  89/95/7976 90:77 AM 10.16 (H) 0.44 - 1.00 mg/dL Final  89/96/7976 88:75 PM 9.79 (H) 0.44 - 1.00 mg/dL Final  93/89/7976 87:79 AM 5.60 (H) 0.44 - 1.00 mg/dL Final  96/90/7976 98:80 PM 8.30 (H) 0.44 - 1.00 mg/dL Final  96/90/7976 96:67 AM 7.73 (H) 0.44 - 1.00 mg/dL Final  96/91/7976 97:46 PM 6.88 (H) 0.44 - 1.00 mg/dL Final  89/87/7977 98:50 AM 6.84 (H) 0.44 - 1.00 mg/dL Final    Comment:    DELTA CHECK NOTED  04/22/2021 10:39 AM 12.15 (H) 0.44 - 1.00 mg/dL Final  89/89/7977 95:52 AM 11.09 (H) 0.44 - 1.00 mg/dL Final  89/90/7977 91:52 PM 11.00 (H) 0.44 - 1.00 mg/dL Final  89/90/7977 91:69 PM 10.60 (H) 0.44 - 1.00 mg/dL Final  90/87/7977 96:59 PM 8.64 (H) 0.44 - 1.00 mg/dL Final  91/75/7977 96:96 PM 8.42 (H) 0.44 - 1.00 mg/dL Final  93/78/7977 88:50 PM 9.05 (H) 0.44 - 1.00 mg/dL Final  96/81/7977 97:82 PM 4.01 (H) 0.57 - 1.00 mg/dL Final    Comment:    **Verified by repeat analysis**  09/24/2020 12:52 AM 5.76 (H) 0.44 - 1.00 mg/dL Final  96/85/7977 98:79 AM  4.88 (H) 0.44 - 1.00 mg/dL Final  96/86/7977 98:86 AM 3.58 (H) 0.44 - 1.00 mg/dL Final  96/87/7977 95:62 AM 4.21 (H) 0.44 - 1.00 mg/dL Final   Recent Labs  Lab 03/27/24 1912  NA 136  K 4.7  CL 94*  CO2 22  GLUCOSE 221*  BUN 106*  CREATININE 8.80*  CALCIUM  8.4*   Recent Labs  Lab 03/27/24 1912  WBC 6.5  NEUTROABS 3.8  HGB 8.3*  HCT 27.6*  MCV 86.8  PLT 133*   Liver Function Tests: Recent Labs  Lab 03/27/24 1912  AST <10*  ALT 24  ALKPHOS 198*  BILITOT 0.5  PROT 6.9  ALBUMIN  3.8   No results for input(s): LIPASE, AMYLASE in the last 168 hours. No results for input(s): AMMONIA in the last 168 hours. Cardiac Enzymes: No results for input(s): CKTOTAL, CKMB, CKMBINDEX, TROPONINI in the last 168 hours. Iron  Studies: No results for input(s): IRON , TIBC, TRANSFERRIN, FERRITIN in the last 72 hours. PT/INR: @LABRCNTIP (inr:5)  Xrays/Other Studies: ) Results for orders placed or performed during the hospital encounter of 03/27/24 (from the past 48 hours)  CBC with Differential     Status: Abnormal   Collection Time: 03/27/24  7:12 PM  Result Value Ref Range   WBC 6.5 4.0 - 10.5 K/uL   RBC 3.18 (L) 3.87 - 5.11 MIL/uL   Hemoglobin 8.3 (L) 12.0 - 15.0 g/dL   HCT 72.3 (L) 63.9 - 53.9 %   MCV 86.8 80.0 - 100.0 fL   MCH 26.1 26.0 - 34.0 pg   MCHC 30.1 30.0 - 36.0 g/dL   RDW 82.5 (H) 88.4 - 84.4 %   Platelets 133 (L) 150 - 400 K/uL   nRBC 0.0 0.0 - 0.2 %   Neutrophils Relative % 58 %   Neutro Abs 3.8 1.7 - 7.7 K/uL   Lymphocytes Relative 26 %   Lymphs Abs 1.7 0.7 - 4.0  K/uL   Monocytes Relative 8 %   Monocytes Absolute 0.5 0.1 - 1.0 K/uL   Eosinophils Relative 6 %   Eosinophils Absolute 0.4 0.0 - 0.5 K/uL   Basophils Relative 1 %   Basophils Absolute 0.0 0.0 - 0.1 K/uL   Immature Granulocytes 1 %   Abs Immature Granulocytes 0.05 0.00 - 0.07 K/uL    Comment: Performed at [facility], [address], [place], [place] [address]   Comprehensive metabolic panel     Status: Abnormal   Collection Time: 03/27/24  7:12 PM  Result Value Ref Range   Sodium 136 135 - 145 mmol/L   Potassium 4.7 3.5 - 5.1 mmol/L   Chloride 94 (L) 98 - 111 mmol/L   CO2 22 22 - 32 mmol/L   Glucose, Bld 221 (H) 70 - 99 mg/dL    Comment: Glucose reference range applies only to samples taken after fasting for at least 8 hours.   BUN 106 (H) 8 - 23 mg/dL   Creatinine, Ser 1.19 (H) 0.44 - 1.00 mg/dL   Calcium  8.4 (L) 8.9 - 10.3 mg/dL   Total Protein 6.9 6.5 - 8.1 g/dL   Albumin  3.8 3.5 - 5.0 g/dL   AST <89 (L) 15 - 41 U/L   ALT 24 0 - 44 U/L   Alkaline Phosphatase 198 (H) 38 - 126 U/L   Total Bilirubin 0.5 0.0 - 1.2 mg/dL   GFR, Estimated 5 (L) >60 mL/min    Comment: (NOTE) Calculated using the CKD-EPI Creatinine Equation (2021)    Anion gap 20 (H) 5 - 15    Comment: Performed at [facility], [address], [place], [place] [address]  Synovial cell count + diff, w/ crystals     Status: Abnormal   Collection Time: 03/28/24 10:45 AM  Result Value Ref Range   Color, Synovial ORANGE (A) YELLOW   Appearance-Synovial CLOUDY (A) CLEAR   Crystals, Fluid NO CRYSTALS SEEN    WBC, Synovial 27 0 - 200 /cu mm    Comment: COUNT MAY BE INACCURATE DUE TO FIBRIN CLUMPS.   Neutrophil, Synovial 38 (H) 0 - 25 %   Lymphocytes-Synovial Fld 43 (H) 0 - 20 %   Monocyte-Macrophage-Synovial Fluid 18 (L) 50 - 90 %   Eosinophils-Synovial 1 0 - 1 %    Comment: Performed at [facility], [address], [place], [place] [address]  Body fluid culture w Gram Stain     Status: None (Preliminary result)   Collection Time: 03/28/24 10:45 AM   Specimen: Synovium; Body Fluid  Result Value Ref Range   Specimen Description SYNOVIAL    Special Requests RIGHT KNEE    Gram Stain      RARE WBC PRESENT,BOTH PMN AND MONONUCLEAR NO ORGANISMS SEEN Performed at [facility], [address], [place], [place] [address]    Culture PENDING    Report Status  PENDING    *Note: Due to a large number of results and/or encounters for the requested time period, some results have not been displayed. A complete set of results can be found in Results Review.   DG HIP UNILAT WITH PELVIS 2-3 VIEWS RIGHT Result Date: 03/28/2024 CLINICAL DATA:  [id number] Hip pain [id number] EXAM: DG HIP (WITH OR WITHOUT PELVIS) 2-3V RIGHT COMPARISON:  07/26/2022 FINDINGS: No evidence of pelvic fracture or diastasis.No acute hip fracture or dislocation.There is no evidence of arthropathy or other focal bone abnormality. Diffuse aortoiliac and peripheral  vascular atherosclerosis. IMPRESSION: No acute fracture, pelvic bone diastasis, or dislocation. Electronically Signed   By: Rogelia Myers M.D.   On: 03/28/2024 18:33   DG Knee Complete 4 Views Right Result Date: 03/27/2024 EXAM: 4 or more VIEW(S) XRAY OF THE RIGHT KNEE 03/27/2024 07:42:00 PM COMPARISON: None available. CLINICAL HISTORY: Knee pain. Reason for exam: knee pain ; Per triage notes: Patient c/o of inability to walk because of sudden onset of pain behind right knee yesterday morning. Pulse +1 palpated, confirmed position with doppler. Hx of fall in past 3 months. FINDINGS: BONES AND JOINTS: No acute fracture. No focal osseous lesion. No joint dislocation. No significant joint effusion. Three compartmental osteoarthritis, with moderate joint space narrowing and osteophyte formation in the medial compartment. SOFT TISSUES: The soft tissues are unremarkable. IMPRESSION: 1. No acute findings. 2. Three compartmental osteoarthritis, with moderate joint space narrowing and osteophyte formation in the medial compartment. Electronically signed by: Dorethia Molt MD 03/27/2024 08:10 PM EDT RP Workstation: [id number]    PMH:   Past Medical History:  Diagnosis Date   Anemia    Aortic stenosis    Echo 8/18: mean 13, peak 28, LVOT/AV mean velocity 0.51   Arthritis    Asthma    As a child    Bronchitis    CAD (coronary artery disease)     a. 09/2016: 50% Ost 1st Mrg stenosis, 50% 2nd Mrg stenosis, 20% Mid-Cx, 95% Prox LAD, 40% mid-LAD, and 10% dist-LAD stenosis. Staged PCI with DES to Prox-LAD.    Chronic combined systolic and diastolic CHF (congestive heart failure) (HCC) 2011   echo 2/18: EF 55-60, normal wall motion, grade 2 diastolic dysfunction, trivial AI // echo 3/18: Septal and apical HK, EF 45-50, normal wall motion, trivial AI, mild LAE, PASP 38 // echo 8/18: EF 60-65, normal wall motion, grade 1 diastolic dysfunction, calcified aortic valve leaflets, mild aortic stenosis (mean 13, peak 28, LVOT/AV mean velocity 0.51), mild AI, moderate MAC, mild LAE, trivial TR    Chronic kidney disease    STAGE 4   Chronic kidney disease on chronic dialysis (HCC)    t, th, sat   Complication of anesthesia    Depression    Diabetes mellitus Dx 1989   Elevated lipids    GERD (gastroesophageal reflux disease)    Gout    Heart murmur    asymptomatic   Hepatitis C Dx 2013   Hypertension Dx 1989   Infected surgical wound    Lt arm   Myocardial infarction (HCC) 07/2015   Obesity    Pancreatitis 2013   Pneumonia    Refusal of blood transfusions as patient is Jehovah's Witness    pt states she is not [place] witness and does not refuse blood products   Tendinitis    Tremors of nervous system    LEFT HAND   Ulcer 2010    PSH:   Past Surgical History:  Procedure Laterality Date   A/V FISTULAGRAM Left 04/11/2019   Procedure: A/V FISTULAGRAM;  Surgeon: Jama Cordella MATSU, MD;  Location: [facility] LAB;  Service: Cardiovascular;  Laterality: Left;   A/V FISTULAGRAM Left 06/02/2019   Procedure: A/V FISTULAGRAM;  Surgeon: Jama Cordella MATSU, MD;  Location: [facility];  Service: Cardiovascular;  Laterality: Left;   AMPUTATION Left 10/28/2022   Procedure: LEFT FOOT 1ST Odem AMPUTATION;  Surgeon: Harden Jerona GAILS, MD;  Location: [facility] OR;  Service: Orthopedics;  Laterality: Left;   APPLICATION OF WOUND VAC Left 08/14/2017  Procedure: APPLICATION OF WOUND VAC Exchange;  Surgeon: Dessa Reyes ORN, MD;  Location: [facility] ORS;  Service: General;  Laterality: Left;   APPLICATION OF WOUND VAC Left 12/21/2018   Procedure: APPLICATION OF WOUND VAC;  Surgeon: Jama Cordella MATSU, MD;  Location: [facility] ORS;  Service: Vascular;  Laterality: Left;   AV FISTULA PLACEMENT Left 08/19/2018   Procedure: ARTERIOVENOUS (AV) FISTULA CREATION ( BRACHIOBASILIC );  Surgeon: Jama Cordella MATSU, MD;  Location: [facility] ORS;  Service: Vascular;  Laterality: Left;   BASCILIC VEIN TRANSPOSITION Left 11/18/2018   Procedure: BASCILIC VEIN TRANSPOSITION;  Surgeon: Jama Cordella MATSU, MD;  Location: [facility] OR[facility];  Service: Vascular;  Laterality: Left;   BIOPSY  09/20/2020   Procedure: BIOPSY;  Surgeon: Rollin Dover, MD;  Location: [facility] ENDOSCOPY;  Service: Endoscopy;;   CHOLECYSTECTOMY     COLONOSCOPY WITH PROPOFOL  N/A 02/03/2018   Procedure: COLONOSCOPY WITH PROPOFOL ;  Surgeon: Unk Corinn Skiff, MD;  Location: [facility] ENDOSCOPY;  Service: Gastroenterology;  Laterality: N/A;   COLONOSCOPY WITH PROPOFOL  N/A 07/25/2022   Procedure: COLONOSCOPY WITH PROPOFOL ;  Surgeon: Maryruth Ole DASEN, MD;  Location: [facility] ENDOSCOPY;  Service: Endoscopy;  Laterality: N/A;   CORONARY ANGIOPLASTY  07/2015   STENT   CORONARY STENT INTERVENTION N/A 09/18/2016   Procedure: Coronary Stent Intervention;  Surgeon: Debby DELENA Sor, MD;  Location: [facility];  Service: Cardiovascular;  Laterality: N/A;   DIALYSIS/PERMA CATHETER INSERTION N/A 05/10/2018   Procedure: DIALYSIS/PERMA CATHETER INSERTION;  Surgeon: Jama Cordella MATSU, MD;  Location: [facility];  Service: Cardiovascular;  Laterality: N/A;   DIALYSIS/PERMA CATHETER INSERTION N/A 01/21/2024   Procedure: DIALYSIS/PERMA CATHETER INSERTION;  Surgeon: Lanis Fonda BRAVO, MD;  Location: [facility];  Service: Cardiovascular;  Laterality: N/A;   DRESSING CHANGE UNDER ANESTHESIA Left 08/15/2017   Procedure: exploration of wound  for bleeding;  Surgeon: Dessa Reyes ORN, MD;  Location: [facility] ORS;  Service: General;  Laterality: Left;   ESOPHAGOGASTRODUODENOSCOPY (EGD) WITH PROPOFOL  N/A 02/03/2018   Procedure: ESOPHAGOGASTRODUODENOSCOPY (EGD) WITH PROPOFOL ;  Surgeon: Unk Corinn Skiff, MD;  Location: [facility] ENDOSCOPY;  Service: Gastroenterology;  Laterality: N/A;   ESOPHAGOGASTRODUODENOSCOPY (EGD) WITH PROPOFOL  N/A 09/20/2020   Procedure: ESOPHAGOGASTRODUODENOSCOPY (EGD) WITH PROPOFOL ;  Surgeon: Rollin Dover, MD;  Location: [facility] ENDOSCOPY;  Service: Endoscopy;  Laterality: N/A;   ESOPHAGOGASTRODUODENOSCOPY (EGD) WITH PROPOFOL  N/A 07/25/2022   Procedure: ESOPHAGOGASTRODUODENOSCOPY (EGD) WITH PROPOFOL ;  Surgeon: Maryruth Ole DASEN, MD;  Location: [facility] ENDOSCOPY;  Service: Endoscopy;  Laterality: N/A;   EYE SURGERY  11/17/2018   INCISION AND DRAINAGE ABSCESS Left 08/12/2017   Procedure: INCISION AND DRAINAGE ABSCESS;  Surgeon: Dessa Reyes ORN, MD;  Location: [facility] ORS;  Service: General;  Laterality: Left;   KNEE ARTHROSCOPY     LEFT HEART CATH N/A 09/18/2016   Procedure: Left Heart Cath;  Surgeon: Debby DELENA Sor, MD;  Location: [facility];  Service: Cardiovascular;  Laterality: N/A;   LEFT HEART CATH AND CORONARY ANGIOGRAPHY N/A 09/16/2016   Procedure: Left Heart Cath and Coronary Angiography;  Surgeon: Lonni JONETTA Cash, MD;  Location: [facility];  Service: Cardiovascular;  Laterality: N/A;   LEFT HEART CATH AND CORONARY ANGIOGRAPHY N/A 04/29/2017   Procedure: LEFT HEART CATH AND CORONARY ANGIOGRAPHY;  Surgeon: Mady Lonni, MD;  Location: [facility];  Service: Cardiovascular;  Laterality: N/A;   LOWER EXTREMITY ANGIOGRAPHY Right 03/08/2018   Procedure: LOWER EXTREMITY ANGIOGRAPHY;  Surgeon: Jama Cordella MATSU, MD;  Location: [facility];  Service: Cardiovascular;  Laterality: Right;   PERIPHERAL VASCULAR THROMBECTOMY  01/21/2024   Procedure: PERIPHERAL VASCULAR THROMBECTOMY;  Surgeon:  Lanis Fonda BRAVO, MD;  Location: [facility];  Service: Cardiovascular;;   TUBAL LIGATION     TUBAL LIGATION     UPPER EXTREMITY ANGIOGRAPHY Right 09/19/2019   Procedure: UPPER EXTREMITY ANGIOGRAPHY;  Surgeon: Jama Cordella MATSU, MD;  Location: [facility];  Service: Cardiovascular;  Laterality: Right;   VENOUS STENT  01/21/2024   Procedure: VENOUS STENT;  Surgeon: Lanis Fonda BRAVO, MD;  Location: [facility];  Service: Cardiovascular;;   WOUND DEBRIDEMENT Left 12/21/2018   Procedure: DEBRIDEMENT WOUND;  Surgeon: Jama Cordella MATSU, MD;  Location: [facility];  Service: Vascular;  Laterality: Left;   WOUND DEBRIDEMENT Left 12/30/2018   Procedure: DEBRIDEMENT WOUND WITH VAC PLACEMENT (LEFT UPPER EXTREMITY);  Surgeon: Jama Cordella MATSU, MD;  Location: [facility];  Service: Vascular;  Laterality: Left;    Allergies:  Allergies  Allergen Reactions   Shellfish Allergy Anaphylaxis and Swelling   Diazepam  Other (See Comments)    felt like out of body experience   Morphine  Itching and Other (See Comments)    Tolerated hydromorphone  on 07/2020 *will take along with Benadryl *    Medications:   Prior to Admission medications   Medication Sig Start Date End Date Taking? Authorizing Provider  albuterol  (VENTOLIN  HFA) 108 (90 Base) MCG/ACT inhaler Inhale 2 puffs into the lungs every 4 (four) hours as needed for wheezing or shortness of breath. 06/28/20   Vivienne Delon HERO, PA-C  amLODipine  (NORVASC ) 10 MG tablet Take 10 mg by mouth See admin instructions. Take one tablet by mouth on Monday, Wednesdays, Fridays, and Sundays only    [provider]  ammonium lactate (LAC-HYDRIN) 12 % lotion Apply 1 Application topically daily. 12/15/23   [provider]  aspirin  81 MG EC tablet Take 1 tablet (81 mg total) by mouth daily. 09/20/20   Singh, Prashant K, MD  atorvastatin  (LIPITOR ) 80 MG tablet Take 1 tablet (80 mg total) by mouth every evening. Patient taking differently: Take 80 mg by  mouth daily. 04/24/21   Myrla Jon HERO, MD  buprenorphine  (BUTRANS ) 15 MCG/HR Place 1 patch onto the skin once a week. Sundays 12/24/23   [provider]  carvedilol  (COREG ) 12.5 MG tablet Take 1 tablet (12.5 mg total) by mouth 2 (two) times daily with a meal. 09/19/21   Paige, Richerd PARAS, DO  Continuous Blood Gluc Receiver (FREESTYLE LIBRE 14 DAY READER) DEVI To check blood sugar ACHS 09/27/20   Vivienne Delon HERO, PA-C  Continuous Blood Gluc Sensor (FREESTYLE LIBRE 14 DAY SENSOR) MISC To check blood sugar ACHS; change every 14 days 09/27/20   Vivienne Delon HERO, PA-C  fluticasone  (FLONASE ) 50 MCG/ACT nasal spray Place 2 sprays into both nostrils daily as needed for allergies or rhinitis. 09/28/17   Vivienne Delon HERO, PA-C  fluticasone  furoate-vilanterol (BREO ELLIPTA ) 200-25 MCG/INH AEPB Inhale 1 puff into the lungs daily. 08/30/20   Vivienne Delon HERO, PA-C  gabapentin  (NEURONTIN ) 100 MG capsule Please take 2 capsules (200 mg) after dialysis on dialysis days only Tuesday, Thursday, Saturday. 03/09/24   Everhart, Kirstie, DO  Glucose Blood (BLOOD GLUCOSE TEST STRIPS 333) STRP Enough test stripes to test blood sugar TID x 30 days. No refills 04/16/22   Trudy Anthony HERO, MD  hydrOXYzine  (ATARAX ) 25 MG tablet Take 1 tablet (25 mg total) by mouth 2 (two) times daily. 11/17/22   Judeth Trenda BIRCH, MD  Insulin   Pen Needle 32G X 4 MM MISC Enough pen needles for 100 units of novolin  QAM x 30 days. No refills 04/16/22   Trudy Anthony HERO, MD  lidocaine  (LIDODERM ) 5 % Place 1 patch onto the skin daily. Remove & Discard patch within 12 hours or as directed by MD Patient taking differently: Place 1 patch onto the skin daily as needed (for pain). Remove & Discard patch within 12 hours or as directed by MD 06/25/23   Nettie, April, MD  midodrine  (PROAMATINE ) 10 MG tablet Take 10 mg by mouth See admin instructions. Take 10 mg by mouth 30 minutes before dialysis on Tues/Thurs/Sat 12/23/21   [provider]  naloxone  (NARCAN ) nasal spray 4 mg/0.1 mL Place 1 spray into the nose as directed. 04/08/22   [provider]  nitroGLYCERIN  (NITROSTAT ) 0.4 MG SL tablet Place 1 tablet (0.4 mg total) under the tongue every 5 (five) minutes x 3 doses as needed for chest pain. 03/08/24   Howell Lunger, DO  Oxycodone  HCl 10 MG TABS Take 10 mg by mouth 4 (four) times daily. 04/01/22   [provider]  OZEMPIC, 2 MG/DOSE, 8 MG/3ML SOPN Inject 2 mg into the skin once a week.    [provider]  pantoprazole  (PROTONIX ) 40 MG tablet Take 40 mg by mouth daily before breakfast.    [provider]  sevelamer  carbonate (RENVELA ) 800 MG tablet Take 1,600 mg by mouth 3 (three) times daily with meals. 12/31/23   [provider]  tiZANidine  (ZANAFLEX ) 4 MG tablet Take 1 tablet (4 mg total) by mouth daily as needed for muscle spasms. 03/08/24   Everhart, Kirstie, DO  torsemide  (DEMADEX ) 100 MG tablet Take 1 tablet (100mg ) on NON-dialysis days (Sunday, Monday, Wednesday, Friday) 03/08/24   Everhart, Kirstie, DO  valACYclovir  (VALTREX ) 500 MG tablet TAKE (1) TABLET BY MOUTH EVERY OTHER DAY. Patient taking differently: Take 500 mg by mouth daily. 01/24/21   Bacigalupo, Angela M, MD    Discontinued Meds:   Medications Discontinued During This Encounter  Medication Reason   lidocaine -EPINEPHrine  (XYLOCAINE  W/EPI) 2 %-1:200000 (PF) injection 10 mL     Social History:  reports that she quit smoking about 43 years ago. Her smoking use included cigarettes. She started smoking about 49 years ago. She has a 1.5 pack-year smoking history. She has never used smokeless tobacco. She reports that she does not currently use alcohol. She reports that she does not currently use drugs after having used the following drugs: Marijuana.  Family History:   Family History  Problem Relation Age of Onset   Colon cancer Mother    Heart attack Other    Heart attack Maternal Grandmother     Hypertension Sister    Hypertension Brother    Diabetes Paternal Grandmother    Breast cancer Neg Hx     Blood pressure 101/63, pulse 76, temperature (!) 97.5 F (36.4 C), temperature source Oral, resp. rate 16, height 5' 5 (1.651 m), weight 103.4 kg, SpO2 100%. Gen alert, no distress, RA, nl RR No rash, cyanosis or gangrene Sclera anicteric, throat clear  No jvd or bruits Chest CTA b/l, no rales RRR no MRG Abd soft ntnd no mass or ascites +bs GU deferred MS no joint effusions or deformity but right knee tender Ext 1 RLE edema, no LLE edema Neuro is alert, Ox 3 , nf LUA AVF+bruit        Calysta Craigo, LYNWOOD ORN, MD 03/28/2024, 7:54 PM

## 2024-03-28 NOTE — Plan of Care (Signed)
 FMTS Brief Progress Note  S: No new concerns. Reports no improvement of symptoms.   O: BP (!) 174/58 (BP Location: Right Arm)   Pulse 81   Temp 97.6 F (36.4 C) (Oral)   Resp 16   Ht 5' 5 (1.651 m)   Wt 103.4 kg   SpO2 95%   BMI 37.94 kg/m    General: NAD, pleasant Cardio: RRR, no MRG. Respiratory: CTAB, normal wob on RA Lower extremities: No gross deformity, no ecchymosis, no overt swelling.  Band-Aid over site of aspiration on right knee.  Reports reduced sensation to light touch on right, however tenderness with moderate pressure on right throughout whole extremity.  No excoriations, wounds nor erythema.  A/P: Knee pain - Continue plan as outlined by day team's H&P - Orders reviewed. Labs for AM ordered, which was adjusted as needed.  - If condition changes, plan includes page family medicine teaching service.   Howell Lunger, DO 03/28/2024, 8:56 PM PGY-3, [facility] Night Resident  Please page [phone] with questions.

## 2024-03-28 NOTE — ED Notes (Signed)
 Delay in d/c, pt placed for d/c with orders to do, not ready for d/c

## 2024-03-28 NOTE — Assessment & Plan Note (Addendum)
 Chronic right knee pain d/t osteoarthritis, unable to bear weight at this time likely d/t worsening BLE weakness, numbness, and tingling. XR demonstrated tricompartmental OA w/ moderate joint space narrowing in the medial compartment. On exam, pt is unable to flex knee actively.  - Admit to FMTS, attending Dr. Delores - Vital signs per floor - Carb modified diet  - Right hip x-Popper - Restarted home Oxy 10 mg QID - PT/OT to treat - VTE prophylaxis: SQH - AM RFP - Fall precautions

## 2024-03-28 NOTE — Hospital Course (Signed)
 MECHELL GIRGIS is a 68 y.o.female with a history of chronic pain due to knee OA, ESRD on HD, aortic stenosis (moderate to severe) and mitral stenosis who was admitted to the [facility] Medicine Teaching Service at [facility] for severe knee pain. Her hospital course is detailed below:  R Knee Pain 2/2 osteoarthritis Patient presenting with significant knee pain likely due to osteoarthritis. EDP performed a R knee aspiration to rule out gout and septic arthritis. Patient attempted ambulation at discharge with significant issues with coordination, and was dragging her leg due to severe pain in her knee. On admission she was able to walk w/rollor walker w/ minimal assistant during PT evaluation  ESRD on HD Patient missed HD on 9/16. HD T/Th/Sat. Nephrology consulted. Last iHD was 03/28/24    Other chronic conditions were medically managed with home medications and formulary alternatives as necessary   PCP Follow-up Recommendations: Follow up with Right knee pain management until right knee replacement Follow up with blood glucose monitoring

## 2024-03-28 NOTE — H&P (Addendum)
 Hospital Admission History and Physical Service Pager: [phone]  Patient name: [name] Medical record number: [id number] Date of Birth: 06-29-1956 Age: 68 y.o. Gender: female  Primary Care Provider: Elizbeth Leita Ruth, FNP  Consultants: none Code Status: Full Code Preferred Emergency Contact: Addis,Jazzslyn (Daughter) [phone] (Mobile)  Chief Complaint: Right Hip Pain and Knee Pain and BLE weakness/tingling  Differential and Medical Decision Making:  [name] is a 68 y.o. female with past medical history of HTN, T2DM, GERD, HF, OSA, and ESRD on dialysis TThS. She denies fever, chills, or any sign of infection. presenting with 2 days of worsening right knee pain starting 2 days ago which haven't been improved with pain medication along with worsening BLE weakness and tingling.  Differential for this patient's knee pain includes: Osteoarthritis: Most likely. Patient with known history of OA. On scheduled Oxycodone  at home. CXR on right knee on 03/27/24 shows three compartmental osteoarthritis, with moderate joint space narrowing and osteophyte formation in the medial compartment. Pain worse w/ movement and improved w/ rest. Gout: Patient with know history of Gout. Uric acid pending, joint aspiration performed in the ED with no crystals noted. Septic arthritis: Less likely as patient denies fever s/s of infection with normal WBC and joint aspiration performed in the ED with normal WBC DVT: Less likely no history of clot, most of her pain is anterior w/o calf pain  Differential for this patient's BLE weakness/tingling includes: Diabetic Neuropathy: Possible worsening with presentation being bilaterally, history of diabetes w/ neuropathy, and upon physical exam during admission patient was sense any touch with eyes closed.  Lumbar radiculopathy: Possible with history of osteoarthritis and body habitus, will evaluate further with MRI Cauda Equina: Will get MRI to rule out with  history of BLE weakness, numbness, and tingling. However, patient doesn't report any urinary incontinence. Assessment & Plan Knee pain, right Chronic right knee pain d/t osteoarthritis, unable to bear weight at this time likely d/t worsening BLE weakness, numbness, and tingling. XR demonstrated tricompartmental OA w/ moderate joint space narrowing in the medial compartment. On exam, pt is unable to flex knee actively.  - Admit to FMTS, attending Dr. Delores - Vital signs per floor - Carb modified diet  - Right hip x-Steinmetz - Restarted home Oxy 10 mg QID - PT/OT to treat - VTE prophylaxis: SQH - AM RFP - Fall precautions Weakness of lower extremity Patient has a hx of controlled T2DM, A1c 7 with diabetic neuropathy. On exam she expresses inability to feel sensation on BLE extremities, but exhibits good pulses and denies any urinary incontinence. - MRI w/o contrast for RLE for further workup and evaluation of above differential ESRD (end stage renal disease) on dialysis (HCC) ESRD on HD Tu/Th/Sat. Missed HD 9/16. - Nephrology consulted, appreciated recommendations - Plan for HD this evening as able Chronic health problem T2DM: CBG x 4 w/ SSI, hold Ozempic 2 mg weekly Essential HTN: Holding Amlodipine  10 mg tab on non dialysis day in the setting of lower BP and continuing Coreg  12.5 mg BID w/ meal GERD: Continue Protonix  40 mg daily Anxiety: Continue Atarax  25 mg BID  Asthma: Add Albuterol  prn Acute diastolic CHF: Torsemide  100 MG on non-dialysis day HLD: Continue Lipitor  80 mg daily  Osteoarthritis: Oxycodone  10 mg TID, holding tiZANidine  4 mg daily PRN in the setting of Qtc prolongation previously, will repeat EKG Chronic gout due to renal impairment Aortic stenosis : ASA 81 mg daily ESRD (end stage renal disease): Continue T/Th/S  HD, continue Renvela  800 mg TID, continue midodrine  10 mg PO 30 mins before iHD COPD: Pt was unaware of diagnosis OSA: Will check if she wears a CPAP  FEN/GI:  carb modified diet VTE Prophylaxis: Heparin   Disposition: Med-Surg  History of Present Illness:  [name] is a 68 y.o. female presenting with right hip and knee pain.  Starting 2 days ago which was not improved w/ pain medication. She denies fall or injury.  Patient reports she has bilateral lower extremity weakness and numbness as of late and has difficulty putting weight on her knee, especially on the right.  This has been worse acutely.  She has some associated back pain, seems to be at her baseline.  She is currently awaiting bilateral knee replacements.  Patient missed dialysis today as scheduled for her.  Denies fevers, chills, chest pain, shortness of breath, abdominal pain, nausea, vomiting, change in bowel or bladder habits or incontinence.  In the ED, her hemodynamically stable w/ normal WBC, H/H 8.3/27.6, with BUN 106 and sCr 8.80, GFR <10. And Alk Phos 198. Her Right knee radiograph show no acute findings w/ three compartmental osteoarthritis, with moderate joint space narrowing and osteophyte formation in the medial compartment.  Knee aspiration was performed by EDP and sent for analysis, revealed no crystals and normal white blood count.  Pertinent Past Medical History: DMII  Essential hypertension OSA (obstructive sleep apnea) Chronic hepatitis C  Diabetic neuropathy  GERD (gastroesophageal reflux disease) Depression  Asthma  Acute diastolic CHF  HLD Osteoarthritis  Chronic gout due to renal impairment  Aortic stenosis  ESRD (end stage renal disease) on dialysis  Ascending aortic aneurysm  Iron  deficiency anemia, unspecified  COPD (chronic obstructive pulmonary disease) Refusal of blood product Acute pancreatitis Obesity    Pertinent Past Surgical History: Fistula Cholecystectomy PCI Remainder reviewed in history tab.   Pertinent Social History: Tobacco use: No [facility] many years ago Alcohol use: No Other Substance use: No Lives with sister  Pertinent  Family History: None contributing  Important Outpatient Medications: Albuterol  108 (90 Base) MCG/ACT inhaler Amlodipine  10 MG tablet on M/W/F/Sun  Aspirin  81 MG EC tablet Daily Atorvastatin  80 MG tablet Daily Buprenorphine  15 MCG/HR  Carvedilol12.5 MG tablet BID Fluticasone  furoate-vilanterol 200-25 MCG/INH AEPB Inhale 1 puff into the lungs daily - not taking Gabapentin  100 MG capsule- 2 capsules (200 mg) after dialysis on dialysis days only  Hydroxyzine  25 MG tablet BID   Midodrine  10 MG tablet 30 minutes before dialysis on Tues/Thurs/Sat   Oxycodone  HCl 10 MG TABS Take 10 mg by mouth 4 (four) times daily.  Ozempic, 2 MG/DOSE, 8 MG/3ML SOPN - Inject 2 mg into the skin once a week.   Pantoprazole  40 MG tablet daily before breakfast.   Sevelamer  carbonate 800 MG tablet Take 1,600 mg by mouth 3 (three) times daily with meals.  Tizandine  4 MG tablet Take 1 tablet (4 mg total) Daily Torsemide  100 MG tablet. Take 1 tablet (100mg ) on NON-dialysis days  Valcyclovir 500 MG tablet take 1 tab every other day   Objective: BP (!) 150/75   Pulse 74   Temp (!) 97.4 F (36.3 C) (Oral)   Resp 16   Ht 5' 5 (1.651 m)   Wt 103.4 kg   SpO2 99%   BMI 37.94 kg/m  Exam: Physical Exam Constitutional:      Appearance: Normal appearance. She is obese.  Cardiovascular:     Rate and Rhythm: Normal rate and regular rhythm.  Pulses: Normal pulses.          Dorsalis pedis pulses are 2+ on the right side and 2+ on the left side.       Posterior tibial pulses are 2+ on the right side and 2+ on the left side.     Heart sounds: Normal heart sounds.  Pulmonary:     Effort: Pulmonary effort is normal.     Breath sounds: Normal breath sounds.  Abdominal:     Palpations: Abdomen is soft.     Tenderness: There is abdominal tenderness in the right upper quadrant.  Musculoskeletal:        General: Tenderness (Right knee) present.     Left Lower Extremity: (first toe) Feet:     Right foot:     Skin  integrity: Callus present.     Left foot:     Skin integrity: Callus present.  Skin:    General: Skin is warm and dry.     Capillary Refill: Capillary refill takes less than 2 seconds.  Neurological:     General: No focal deficit present.     Mental Status: She is alert and oriented to person, place, and time.     Sensory: Sensory deficit (bilateral plantars) present.     Labs:  CBC BMET  Recent Labs  Lab 03/27/24 1912  WBC 6.5  HGB 8.3*  HCT 27.6*  PLT 133*   Recent Labs  Lab 03/27/24 1912  NA 136  K 4.7  CL 94*  CO2 22  BUN 106*  CREATININE 8.80*  GLUCOSE 221*  CALCIUM  8.4*       Imaging Studies Performed: Narrative & Impression  EXAM: 4 or more VIEW(S) XRAY OF THE RIGHT KNEE 03/27/2024 07:42:00 PM     IMPRESSION: 1. No acute findings. 2. Three compartmental osteoarthritis, with moderate joint space narrowing and osteophyte formation in the medial compartment.   Electronically signed by: Dorethia Molt MD 03/27/2024 08:10 PM EDT RP Workstation: [id number]    Suzen Houston NOVAK, DO 03/28/2024, 4:38 PM PGY-1, [facility]  FPTS Intern pager: [phone], text pages welcome Secure chat group [facility] Teaching Service   I have discussed the above with Dr. Coralee and agree with the documented plan. My edits for correction/addition/clarification are included above. Please see any attending notes.   Kathrine Melena, DO PGY-2, [facility] 03/28/2024 6:59 PM

## 2024-03-28 NOTE — ED Notes (Signed)
 Pt was able to stand with light assistance. Pt used chair to support herself on and was able to take a few steps. Right leg did drag a bit but could take a few steps. Pt was also able to get herself back in bed.

## 2024-03-28 NOTE — ED Provider Notes (Signed)
  Physical Exam  BP (!) 150/75   Pulse 74   Temp (!) 97.4 F (36.3 C) (Oral)   Resp 16   Ht 5' 5 (1.651 m)   Wt 103.4 kg   SpO2 99%   BMI 37.94 kg/m   Physical Exam  Procedures  Procedures  ED Course / MDM   Clinical Course as of 03/28/24 1621  Tue Mar 28, 2024  1100 Patient underwent aspiration of the right knee.  Please see procedures tab. [NS]  1437 Synovial cell count + diff, w/ crystals(!) No evidence of septic joint or gout.  Likely osteoarthritic process.  Will try and coordinate her dialysis session  [NS]  1523 Patient with continued pain, stating she is unable to walk.  Will give Dilaudid  and Toradol  and reevaluate [NS]    Clinical Course User Index [NS] Napoleon Limes, MD   Medical Decision Making Amount and/or Complexity of Data Reviewed Labs: ordered. Decision-making details documented in ED Course.  Risk Prescription drug management. Decision regarding hospitalization.   Patient presenting with significant knee pain likely due to osteoarthritis.  Knee tap ruled out gout or septic arthritis.  Patient on attempts at ambulation at discharge with significant issues with coordination, dragging her leg due to severe pain in her knee.  Medicine consulted for admission in the setting of severe osteoarthritis pain.  Patient also missed dialysis today and is due for hemodialysis.  Calcium  last night 4.7 at 1912, will repeat metabolic panel.  Family Practice: Accepted the patient in admission.     Jerrol Agent, MD 03/28/24 [phone]

## 2024-03-28 NOTE — Assessment & Plan Note (Signed)
 Patient has a hx of controlled T2DM, A1c 7 with diabetic neuropathy. On exam she expresses inability to feel sensation on BLE extremities, but exhibits good pulses and denies any urinary incontinence. - MRI w/o contrast for RLE for further workup and evaluation of above differential

## 2024-03-28 NOTE — Discharge Instructions (Addendum)
 Thank you, [name], for allowing us  to provide your care today. Today we discussed . . .  > Right knee pain       - The fluid in your knee and your x-Escorcia was consistent with osteoarthritis.  You will likely need to follow-up with an orthopedist to discuss total knee replacement.  Please also follow-up with your PCP about this.     Follow up: With your PCP       Melvenia Morrison, [place]

## 2024-03-29 ENCOUNTER — Observation Stay (HOSPITAL_COMMUNITY)

## 2024-03-29 ENCOUNTER — Observation Stay (HOSPITAL_BASED_OUTPATIENT_CLINIC_OR_DEPARTMENT_OTHER)

## 2024-03-29 DIAGNOSIS — R29898 Other symptoms and signs involving the musculoskeletal system: Secondary | ICD-10-CM | POA: Diagnosis not present

## 2024-03-29 DIAGNOSIS — M79661 Pain in right lower leg: Secondary | ICD-10-CM | POA: Diagnosis not present

## 2024-03-29 DIAGNOSIS — M25561 Pain in right knee: Secondary | ICD-10-CM | POA: Diagnosis not present

## 2024-03-29 LAB — RENAL FUNCTION PANEL
Albumin: 3.1 g/dL — ABNORMAL LOW (ref 3.5–5.0)
Anion gap: 17 — ABNORMAL HIGH (ref 5–15)
BUN: 52 mg/dL — ABNORMAL HIGH (ref 8–23)
CO2: 25 mmol/L (ref 22–32)
Calcium: 8.2 mg/dL — ABNORMAL LOW (ref 8.9–10.3)
Chloride: 95 mmol/L — ABNORMAL LOW (ref 98–111)
Creatinine, Ser: 5.35 mg/dL — ABNORMAL HIGH (ref 0.44–1.00)
GFR, Estimated: 8 mL/min — ABNORMAL LOW (ref >60)
Glucose, Bld: 357 mg/dL — ABNORMAL HIGH (ref 70–99)
Phosphorus: 4.1 mg/dL (ref 2.5–4.6)
Potassium: 3.9 mmol/L (ref 3.5–5.1)
Sodium: 137 mmol/L (ref 135–145)

## 2024-03-29 LAB — HEPATITIS PANEL, ACUTE
HCV Ab: REACTIVE — AB
Hep A IgM: NONREACTIVE
Hep B C IgM: NONREACTIVE
Hepatitis B Surface Ag: NONREACTIVE

## 2024-03-29 LAB — GLUCOSE, CAPILLARY
Glucose-Capillary: 352 mg/dL — ABNORMAL HIGH (ref 70–99)
Glucose-Capillary: 148 mg/dL — ABNORMAL HIGH (ref 70–99)
Glucose-Capillary: 260 mg/dL — ABNORMAL HIGH (ref 70–99)
Glucose-Capillary: 276 mg/dL — ABNORMAL HIGH (ref 70–99)

## 2024-03-29 LAB — MRSA NEXT GEN BY PCR, NASAL: MRSA by PCR Next Gen: NOT DETECTED

## 2024-03-29 MED ORDER — CHLORHEXIDINE GLUCONATE CLOTH 2 % EX PADS
6.0000 | MEDICATED_PAD | Freq: Every day | CUTANEOUS | Status: DC
  Administered 2024-03-30: 6 via TOPICAL

## 2024-03-29 MED ORDER — OXYCODONE HCL 5 MG PO TABS: 10.0000 mg | ORAL_TABLET | Freq: Four times a day (QID) | ORAL | Status: DC | PRN

## 2024-03-29 NOTE — Progress Notes (Addendum)
 Pt receives out-pt HD at [facility] on TTS 10:55 am chair time. Contacted attending to inquire about possible d/c date. Will assist as needed.   Randine Mungo Dialysis Navigator [phone]  Addendum at 4:27 pm: Per attending staff, pt will likely not d/c today. Inquired if pt would be appropriate to request inpt HD on 2nd shift tomorrow in the event pt stable for d/c in the am and can get to out-pt clinic for treatment instead. Staff felt that was appropriate. Contacted inpt HD staff to request pt be placed on 2nd shift for inpt HD tomorrow in case pt stable for d/c in the am and can get to out-pt clinic to avoid HD on day of d/c.

## 2024-03-29 NOTE — Progress Notes (Signed)
 RLE venous duplex has been completed.   Results can be found under chart review under CV PROC. 03/29/2024 5:35 PM Ambriana Selway RVT, RDMS

## 2024-03-29 NOTE — Discharge Summary (Shared)
 Family Medicine Teaching [facility] Discharge Summary  Patient name: [name] Medical record number: [id number] Date of birth: August 19, 1955 Age: 68 y.o. Gender: female Date of Admission: 03/27/2024  Date of Discharge: 03/30/24 Admitting Physician: Suzann CHRISTELLA Daring, MD  Primary Care Provider: No primary care provider on file. Consultants: None  Indication for Hospitalization: Right knee pain and weakness  Discharge Diagnoses/Problem List:  Principal Problem for Admission:  Other Problems addressed during stay:  Principal Problem:   Knee pain, right Active Problems:   ESRD (end stage renal disease) on dialysis [facility])   Chronic health problem   Weakness of lower extremity   The above problem list has been updated and reviewed for accuracy, including the initial reason for admission.   Brief Hospital Course:  [name] is a 68 y.o.female with a history of chronic pain due to knee OA, ESRD on HD, aortic stenosis (moderate to severe) and mitral stenosis who was admitted to the [facility] Medicine Teaching Service at [facility] for severe knee pain. Her hospital course is detailed below:  R Knee Pain 2/2 osteoarthritis Patient presenting with significant knee pain likely due to osteoarthritis. EDP performed a R knee aspiration which ruled out gout and septic arthritis. Patient attempted ambulation at discharge with significant issues with coordination, and was dragging her leg due to severe pain in her knee. No significant acute finding on xray or MRI. DVT US  negative. With pain control and therapy over admission, she was able to walk w/rollor walker w/ minimal assistance by discharge to home with home health.  ESRD on HD iHD T/Th/Sat. Nephrology consulted. Last iHD was 03/30/24.  Other chronic conditions were medically managed with home medications and formulary alternatives as necessary   PCP Follow-up Recommendations: Ensure continued improvement in knee pain Follow up with blood  glucose monitoring  Ensure patient f/u with ortho surgery for knee replacement schedule. Patient A1C is 7  Follow up pending HCV RNA quantitative and consider treatment given positive HCV ab Continue to monitor Hgb in the setting of ESRD    Results/Tests Pending at Time of Discharge:  Unresulted Labs (From admission, onward)     Start     Ordered   03/29/24 0620  HCV RNA Diagnosis, NAA  Once,   R        03/29/24 0619             Disposition: Home  Discharge Condition: Good  Discharge Exam:  Vitals:   03/30/24 1100 03/30/24 1130  BP: (!) 152/56 (!) 150/56  Pulse: 82 77  Resp: 11 11  Temp:    SpO2: 95% 98%   Physical Exam Constitutional:      Appearance: Normal appearance. She is obese.  Cardiovascular:     Rate and Rhythm: Normal rate and regular rhythm.     Pulses: Normal pulses.          Dorsalis pedis pulses are 2+ on the right side and 2+ on the left side.       Posterior tibial pulses are 2+ on the right side and 2+ on the left side.     Heart sounds: Normal heart sounds.  Pulmonary:     Effort: Pulmonary effort is normal.     Breath sounds: Normal breath sounds.  Abdominal:     Palpations: Abdomen is soft.  Musculoskeletal:        General: Tenderness (Right knee) present.     Left Lower Extremity: (first toe) Neurological:  General: No focal deficit present.     Mental Status: She is alert and oriented to person, place, and time.     Sensory: Sensory deficit (bilateral plantars) present.        Significant Procedures: None  Significant Labs and Imaging:     Latest Ref Rng & Units 03/30/2024   10:09 AM 03/27/2024    7:12 PM 03/07/2024    4:13 PM  CBC  WBC 4.0 - 10.5 K/uL 4.9  6.5  4.1   Hemoglobin 12.0 - 15.0 g/dL 7.5  8.3  8.0   Hematocrit 36.0 - 46.0 % 24.6  27.6  26.3   Platelets 150 - 400 K/uL 102  133  105      Recent Labs  Lab 03/29/24 0744 03/30/24 0515  NA 137 137  K 3.9 4.9  CL 95* 92*  CO2 25 28  GLUCOSE 357* 216*  BUN  52* 69*  CREATININE 5.35* 6.65*  CALCIUM  8.2* 8.5*  PHOS 4.1 5.8*  ALBUMIN  3.1* 3.3*   Imaging Studies Performed: Narrative & Impression  EXAM: 4 or more VIEW(S) XRAY OF THE RIGHT KNEE 03/27/2024 07:42:00 PM      IMPRESSION: 1. No acute findings. 2. Three compartmental osteoarthritis, with moderate joint space narrowing and osteophyte formation in the medial compartment.   Electronically signed by: Dorethia Molt MD 03/27/2024 08:10 PM EDT RP Workstation: [id number]  EXAM: DG HIP (WITH OR WITHOUT PELVIS) 2-3V RIGHT   IMPRESSION: No acute fracture, pelvic bone diastasis, or dislocation.    Discharge Medications:  Allergies as of 03/30/2024       Reactions   Shellfish Allergy Anaphylaxis, Swelling   Diazepam  Other (See Comments)   felt like out of body experience   Morphine  Itching, Other (See Comments)   Tolerated hydromorphone  on 07/2020 *will take along with Benadryl *        Medication List     TAKE these medications    albuterol  108 (90 Base) MCG/ACT inhaler Commonly known as: VENTOLIN  HFA Inhale 2 puffs into the lungs every 4 (four) hours as needed for wheezing or shortness of breath.   amLODipine  10 MG tablet Commonly known as: NORVASC  Take 10 mg by mouth See admin instructions. Take one tablet by mouth on Monday, Wednesdays, Fridays, and Sundays only   ammonium lactate 12 % lotion Commonly known as: LAC-HYDRIN Apply 1 Application topically daily as needed for dry skin.   aspirin  EC 81 MG tablet Take 1 tablet (81 mg total) by mouth daily.   atorvastatin  80 MG tablet Commonly known as: LIPITOR  Take 1 tablet (80 mg total) by mouth every evening. What changed: when to take this   buprenorphine  15 MCG/HR Commonly known as: BUTRANS  Place 1 patch onto the skin once a week. Sundays   carvedilol  12.5 MG tablet Commonly known as: COREG  Take 1 tablet (12.5 mg total) by mouth 2 (two) times daily with a meal.   fluticasone  50 MCG/ACT nasal  spray Commonly known as: FLONASE  Place 2 sprays into both nostrils daily as needed for allergies or rhinitis.   fluticasone  furoate-vilanterol 200-25 MCG/INH Aepb Commonly known as: BREO ELLIPTA  Inhale 1 puff into the lungs daily.   FreeStyle Libre 14 Day Reader Espiridion To check blood sugar ACHS   FreeStyle Libre 14 Day Sensor Misc To check blood sugar ACHS; change every 14 days   gabapentin  100 MG capsule Commonly known as: NEURONTIN  Please take 2 capsules (200 mg) after dialysis on dialysis days only Tuesday, Thursday, Saturday.  hydrOXYzine  25 MG tablet Commonly known as: ATARAX  Take 1 tablet (25 mg total) by mouth 2 (two) times daily.   Insulin  Pen Needle 32G X 4 MM Misc Enough pen needles for 100 units of novolin  QAM x 30 days. No refills   lidocaine  5 % Commonly known as: Lidoderm  Place 1 patch onto the skin daily. Remove & Discard patch within 12 hours or as directed by MD What changed:  when to take this reasons to take this   naloxone  4 MG/0.1ML Liqd nasal spray kit Commonly known as: NARCAN  Place 1 spray into the nose daily as needed (for overdose).   nitroGLYCERIN  0.4 MG SL tablet Commonly known as: NITROSTAT  Place 1 tablet (0.4 mg total) under the tongue every 5 (five) minutes x 3 doses as needed for chest pain.   Oxycodone  HCl 10 MG Tabs Take 1 tablet (10 mg total) by mouth 4 (four) times daily.   Ozempic (2 MG/DOSE) 8 MG/3ML Sopn Generic drug: Semaglutide (2 MG/DOSE) Inject 2 mg into the skin once a week.   pantoprazole  40 MG tablet Commonly known as: PROTONIX  Take 40 mg by mouth daily before breakfast.   polyethylene glycol 17 g packet Commonly known as: MIRALAX  / GLYCOLAX  Take 17 g by mouth daily as needed for mild constipation.   senna 8.6 MG Tabs tablet Commonly known as: SENOKOT Take 1 tablet (8.6 mg total) by mouth 2 (two) times daily.   sevelamer  carbonate 800 MG tablet Commonly known as: RENVELA  Take 1,600 mg by mouth 3 (three) times  daily with meals.   tiZANidine  4 MG tablet Commonly known as: ZANAFLEX  Take 1 tablet (4 mg total) by mouth daily as needed for muscle spasms.   torsemide  100 MG tablet Commonly known as: DEMADEX  Take 1 tablet (100mg ) on NON-dialysis days (Sunday, Monday, Wednesday, Friday)   valACYclovir  500 MG tablet Commonly known as: VALTREX  TAKE (1) TABLET BY MOUTH EVERY OTHER DAY. What changed: See the new instructions.               Durable Medical Equipment  (From admission, onward)           Start     Ordered   03/30/24 1110  For home use only DME Bedside commode  Once       Question:  Patient needs a bedside commode to treat with the following condition  Answer:  Knee instability   03/30/24 1111   03/30/24 1110  For home use only DME Walker  Once       Question:  Patient needs a walker to treat with the following condition  Answer:  Knee instability   03/30/24 1111             Discharge Instructions: Please refer to Patient Instructions section of EMR for full details.  Patient was counseled important signs and symptoms that should prompt return to medical care, changes in medications, dietary instructions, activity restrictions, and follow up appointments.   Follow-Up Appointments:  Follow-up Information     Elizbeth Leita Ruth, FNP .   Specialty: Endocrinology Contact information: [address] [place] [address] [phone]         [facility]. Go on 03/29/2024.   Why: Please arrive at 7:40 am for 8:00 am chair time. This is for Wednesday make-up appt only. Contact information: [address] [place] [address] [phone]         Health, [address] Schedule an appointment as soon as possible for a  visit.   Why: ASAP for hospital follow up Contact information: [address] [place] [address] [phone]         [facility], [facility] [place]  Follow up.   Why: home health has been arranged. They  will contact you to schedule apt within 48hrs post discharge. Contact information: [address] [place] [address] [phone]                 Suzen Houston NOVAK, DO 03/30/2024, 11:37 AM PGY-1, [facility] Family Medicine   I agree with the assessment and plan as documented above.  Stuart Redo, MD PGY-3, [facility] Family Medicine

## 2024-03-29 NOTE — Assessment & Plan Note (Addendum)
 Patient has a hx of controlled T2DM, A1c 7 with diabetic neuropathy. On exam she expresses inability to feel sensation on BLE extremities, but exhibits good pulses  - MRI w/o contrast for RLE for further workup

## 2024-03-29 NOTE — Progress Notes (Addendum)
 Daily Progress Note Intern Pager: [phone]  Patient name: [name] Medical record number: [id number] Date of birth: [birth year] Age: 68 y.o. Gender: female  Primary Care Provider: Elizbeth Leita Ruth, FNP Consultants: None Code Status: Full  Pt Overview and Major Events to Date:  03/28/24 : Admitted for Right knee pain and weakness  Assessment 68 yo with chronic pain due to knee OA, ESRD on HD, aortic stenosis (moderate to severe) and mitral stenosis presenting with a few days of right lower extremity pain and numbness.  Assessment & Plan Knee pain, right Chronic right knee pain d/t osteoarthritis. Right knee XR shows tricompartmental OA w/ moderate joint space narrowing in the medial compartment and no significant finding on right hip XR. She was able to walk w/ PT w/ roller walker - Vital signs per floor - Carb modified diet  - Continue home Oxy 10 mg QID - Pending RLE DVT US  - PT consulted and follow - VTE prophylaxis: SQH - AM RFP - Fall precautions Weakness of lower extremity Patient has a hx of controlled T2DM, A1c 7 with diabetic neuropathy. On exam she expresses inability to feel sensation on BLE extremities, but exhibits good pulses  - MRI w/o contrast for RLE for further workup  ESRD (end stage renal disease) on dialysis (HCC) ESRD on HD Tu/Th/Sat. - Nephrology consulted, appreciated recommendations - Daily RPF Chronic health problem T2DM: CBG x 4 w/ SSI, hold Ozempic 2 mg weekly. Patient w/ elevated BG this morning but states she had Gram crackers w/ peanut butter around 4am before AM Labs Essential HTN: Holding Amlodipine  10 mg tab on non dialysis day in the setting of lower BP and continuing Coreg  12.5 mg BID w/ meal GERD: Continue Protonix  40 mg daily Anxiety: Continue Atarax  25 mg BID  Asthma: Add Albuterol  prn Acute diastolic CHF: Torsemide  100 MG on non-dialysis day HLD: Continue Lipitor  80 mg daily  Osteoarthritis: Oxycodone  10 mg TID, holding  tiZANidine  4 mg daily PRN in the setting of Qtc prolongation previously, will repeat EKG Chronic gout due to renal impairment Aortic stenosis : ASA 81 mg daily ESRD (end stage renal disease): Continue T/Th/S HD, continue Renvela  800 mg TID, continue midodrine  10 mg PO 30 mins before iHD COPD: Pt was unaware of diagnosis OSA: Will check if she wears a CPAP   FEN/GI: Carb modified diet  PPx: SQH Dispo:Home   Subjective:  Doing well this morning. Still c/o right knee weakness but was able to ambulate w/ roller walker. States able urinate and have BM last BM was yesterday. She refused MRI she explained that she just got her hair done on Saturday w/ little metals in the back of her head and do not want to take them off.   Objective: Temp:  [97.4 F (36.3 C)-98.2 F (36.8 C)] 98.2 F (36.8 C) (09/17 0547) Pulse Rate:  [65-88] 88 (09/17 0731) Resp:  [9-20] 18 (09/17 0731) BP: (101-194)/(46-97) 126/46 (09/17 0731) SpO2:  [95 %-100 %] 99 % (09/17 0731)  Physical Exam Constitutional:      Appearance: Normal appearance. She is obese.  Cardiovascular:     Rate and Rhythm: Normal rate and regular rhythm.     Pulses: Normal pulses.          Dorsalis pedis pulses are 2+ on the right side and 2+ on the left side.       Posterior tibial pulses are 2+ on the right side and 2+ on the left side.  Heart sounds: Normal heart sounds.  Pulmonary:     Effort: Pulmonary effort is normal.     Breath sounds: Normal breath sounds.  Abdominal:     Palpations: Abdomen is soft.     Tenderness: There is abdominal tenderness in the right upper quadrant.  Musculoskeletal:        General: Tenderness (Right knee) present.     Left Lower Extremity: (first toe) Neurological:     General: No focal deficit present.     Mental Status: She is alert and oriented to person, place, and time.     Sensory: Sensory deficit (bilateral plantars) present.     Laboratory: Most recent CBC Lab Results  Component  Value Date   WBC 6.5 03/27/2024   HGB 8.3 (L) 03/27/2024   HCT 27.6 (L) 03/27/2024   MCV 86.8 03/27/2024   PLT 133 (L) 03/27/2024   Most recent BMP    Latest Ref Rng & Units 03/29/2024    7:44 AM  BMP  Glucose 70 - 99 mg/dL 642   BUN 8 - 23 mg/dL 52   Creatinine 9.55 - 1.00 mg/dL 4.64   Sodium 864 - 854 mmol/L 137   Potassium 3.5 - 5.1 mmol/L 3.9   Chloride 98 - 111 mmol/L 95   CO2 22 - 32 mmol/L 25   Calcium  8.9 - 10.3 mg/dL 8.2     Other pertinent labs A1C 7   Imaging/Diagnostic Tests: EXAM: DG HIP (WITH OR WITHOUT PELVIS) 2-3V RIGHT  IMPRESSION: No acute fracture, pelvic bone diastasis, or dislocation.  Suzen Houston NOVAK, DO 03/29/2024, 9:21 AM  PGY-1, [facility] Family Medicine FPTS Intern pager: [phone], text pages welcome Secure chat group [facility] Teaching Service

## 2024-03-29 NOTE — Assessment & Plan Note (Addendum)
 Chronic right knee pain d/t osteoarthritis. Right knee XR shows tricompartmental OA w/ moderate joint space narrowing in the medial compartment and no significant finding on right hip XR. She was able to walk w/ PT w/ roller walker - Vital signs per floor - Carb modified diet  - Continue home Oxy 10 mg QID - Pending RLE DVT US  - PT consulted and follow - VTE prophylaxis: SQH - AM RFP - Fall precautions

## 2024-03-29 NOTE — Progress Notes (Signed)
 Physical Therapy Treatment Patient Details Name: [name] MRN: [id number] DOB: 21-Jan-1956 Today's Date: 03/29/2024   History of Present Illness 68 y.o. female presents to [facility] hospital on 03/27/2024 with acute R knee pain and numbness. PMH: HFpEF, ESRD, HTN, CAD, DM    PT Comments  Pt seen for PT tx with pt agreeable, extra time to initiate supine>sit but is able to complete with supervision. Pt transfers sit<>stand with cuing re: hand placement with fair return demo. Pt is able to ambulate increased distances in hallway with RW & CGA fade to supervision, improving gait pattern with cuing. Pt does report limited tolerance to static standing, endorsing pain down posterior RLE. Pt reports she can stay on the main level of her home at d/c & has her sister & 23 y/o grandson to assist. Recommend ongoing PT services to progress endurance, activity tolerance, balance & gait with LRAD.    If plan is discharge home, recommend the following: A little help with bathing/dressing/bathroom;Assistance with cooking/housework;Assist for transportation;Help with stairs or ramp for entrance   Can travel by private vehicle     Yes  Equipment Recommendations  Rolling walker (2 wheels);BSC/3in1    Recommendations for Other Services       Precautions / Restrictions Precautions Precautions: Fall Restrictions Weight Bearing Restrictions Per Provider Order: No     Mobility  Bed Mobility Overal bed mobility: Needs Assistance Bed Mobility: Supine to Sit     Supine to sit: Supervision, HOB elevated, Used rails (exit R side of bed)          Transfers Overall transfer level: Needs assistance Equipment used: Rolling walker (2 wheels) Transfers: Sit to/from Stand Sit to Stand: Supervision           General transfer comment: education/cuing re: hand placement during sit<>stand, poor demo of reach back during stand>sit    Ambulation/Gait Ambulation/Gait assistance: Supervision, Contact guard  assist Gait Distance (Feet): 125 Feet Assistive device: Rolling walker (2 wheels) Gait Pattern/deviations: Decreased step length - right, Decreased step length - left, Decreased dorsiflexion - left, Decreased stride length, Decreased dorsiflexion - right Gait velocity: decreased     General Gait Details: Pt initially with absent foot clearance BLE & scooting/sliding BLE along floor to advance. Pt able to lift BLE in static standing & PT educated pt on need for foot clearance with pt then able to return demo throughout rest of gait. Pt with decreased heel strike BLE & dorsiflexion RLE. Leans heavily on BUE on RW, requires 1-2 standing rest breaks. Notes R wrist goes numb with gait.   Stairs             Wheelchair Mobility     Tilt Bed    Modified Rankin (Stroke Patients Only)       Balance Overall balance assessment: Needs assistance Sitting-balance support: Feet supported Sitting balance-Leahy Scale: Good     Standing balance support: Bilateral upper extremity supported, Reliant on assistive device for balance, During functional activity Standing balance-Leahy Scale: Fair                              Hotel manager: No apparent difficulties  Cognition Arousal: Alert Behavior During Therapy: Flat affect   PT - Cognitive impairments: No apparent impairments                       PT - Cognition Comments: extra time to initiate bed  mboility at beginning of session   Following commands impaired: Follows one step commands with increased time, Only follows one step commands consistently    Cueing Cueing Techniques: Verbal cues  Exercises      General Comments General comments (skin integrity, edema, etc.): VSS on RA      Pertinent Vitals/Pain Pain Assessment Pain Assessment: Faces Pain Location: R shoulder after gait Pain Descriptors / Indicators: Discomfort Pain Intervention(s): Monitored during session     Home Living Family/patient expects to be discharged to:: Private residence Living Arrangements: Other relatives Available Help at Discharge: Family;Available 24 hours/day Type of Home: House Home Access: Stairs to enter Entrance Stairs-Rails: None Entrance Stairs-Number of Steps: 1 Alternate Level Stairs-Number of Steps: 13 Home Layout: Two level;Able to live on main level with bedroom/bathroom (1/2 bath downstairs) Home Equipment: Cane - quad Additional Comments: info taken from chart review, pt lethargic and unable to provide PLOF    Prior Function            PT Goals (current goals can now be found in the care plan section) Acute Rehab PT Goals Patient Stated Goal: to return to independence, improve RLE strength PT Goal Formulation: With patient Time For Goal Achievement: 04/11/24 Potential to Achieve Goals: Good Progress towards PT goals: Progressing toward goals    Frequency    Min 3X/week      PT Plan      Co-evaluation              AM-PAC PT 6 Clicks Mobility   Outcome Measure  Help needed turning from your back to your side while in a flat bed without using bedrails?: None Help needed moving from lying on your back to sitting on the side of a flat bed without using bedrails?: A Little Help needed moving to and from a bed to a chair (including a wheelchair)?: A Little Help needed standing up from a chair using your arms (e.g., wheelchair or bedside chair)?: A Little Help needed to walk in hospital room?: A Little Help needed climbing 3-5 steps with a railing? : A Lot 6 Click Score: 18    End of Session   Activity Tolerance: Patient tolerated treatment well Patient left: in bed;with call bell/phone within reach;with bed alarm set Nurse Communication: Mobility status PT Visit Diagnosis: Other abnormalities of gait and mobility (R26.89);Muscle weakness (generalized) (M62.81)     Time: 8942-8879 PT Time Calculation (min) (ACUTE ONLY): 23  min  Charges:    $Gait Training: 8-22 mins $Therapeutic Activity: 8-22 mins PT General Charges $$ ACUTE PT VISIT: 1 Visit                     Richerd Pinal, PT, DPT 03/29/24, 11:31 AM    Richerd CHRISTELLA Pinal 03/29/2024, 11:29 AM

## 2024-03-29 NOTE — Assessment & Plan Note (Addendum)
 T2DM: CBG x 4 w/ SSI, hold Ozempic 2 mg weekly. Patient w/ elevated BG this morning but states she had Gram crackers w/ peanut butter around 4am before AM Labs Essential HTN: Holding Amlodipine  10 mg tab on non dialysis day in the setting of lower BP and continuing Coreg  12.5 mg BID w/ meal GERD: Continue Protonix  40 mg daily Anxiety: Continue Atarax  25 mg BID  Asthma: Add Albuterol  prn Acute diastolic CHF: Torsemide  100 MG on non-dialysis day HLD: Continue Lipitor  80 mg daily  Osteoarthritis: Oxycodone  10 mg TID, holding tiZANidine  4 mg daily PRN in the setting of Qtc prolongation previously, will repeat EKG Chronic gout due to renal impairment Aortic stenosis : ASA 81 mg daily ESRD (end stage renal disease): Continue T/Th/S HD, continue Renvela  800 mg TID, continue midodrine  10 mg PO 30 mins before iHD COPD: Pt was unaware of diagnosis OSA: Will check if she wears a CPAP

## 2024-03-29 NOTE — TOC CM/SW Note (Signed)
 Transition of Care [facility]) - Inpatient Brief Assessment   Patient Details  Name: LYNDSEY DEMOS MRN: [id number] Date of Birth: [birth year]  Transition of Care [facility]) CM/SW Contact:    Lauraine FORBES Saa, LCSWA Phone Number: 03/29/2024, 9:09 AM   Clinical Narrative:  9:09 AM Per chart review, patient resides at home with relatives. Patient has insurance and does not have a PCP but is followed by Cardiology and Endocrinology. Patient does not have SNF history. Patient has HH history with [facility], [facility], [facility], [facility], and [facility]. Patient has DME (BSC, manual wheelchair, rolling walker, CPAP, walker, wound vac) history with [facility], [facility], and [facility]. Patient's preferred pharmacy's are [facility] [address], [facility] [address] [place], and [facility] [phone] [place]. Physical therapy recommended patient discharge home with [facility]. TOC will continue to follow and be available to assist.  Transition of Care Asessment: Insurance and Status: Insurance coverage has been reviewed Patient has primary care physician: No Home environment has been reviewed: Private Residence Prior level of function:: Needs Assistance Prior/Current Home Services: No current home services Social Drivers of Health Review: SDOH reviewed no interventions necessary Readmission risk has been reviewed: Yes (Currently Observation Status) Transition of care needs: transition of care needs identified, TOC will continue to follow

## 2024-03-29 NOTE — Progress Notes (Signed)
   03/29/24 0500  Vitals  Temp 98.1 F (36.7 C)  Temp Source Oral  BP 124/70  MAP (mmHg) 86  BP Location Right Arm  BP Method Automatic  Patient Position (if appropriate) Lying  Pulse Rate 78  Pulse Rate Source Monitor  ECG Heart Rate 78  Resp 12  Oxygen  Therapy  SpO2 100 %  O2 Device Nasal Cannula  During Treatment Monitoring  Blood Flow Rate (mL/min) 0 mL/min  Arterial Pressure (mmHg) -1.01 mmHg  Venous Pressure (mmHg) -1.82 mmHg  TMP (mmHg) 13.73 mmHg  Ultrafiltration Rate (mL/min) 942 mL/min  Dialysate Flow Rate (mL/min) 299 ml/min  Dialysate Potassium Concentration 2  Dialysate Calcium  Concentration 2.5  Duration of HD Treatment -hour(s) 4 hour(s)  Cumulative Fluid Removed (mL) per Treatment  3000.11  HD Safety Checks Performed Yes  Intra-Hemodialysis Comments Tx completed  Post Treatment  Dialyzer Clearance Lightly streaked  Liters Processed 82  Fluid Removed (mL) 3000 mL  Tolerated HD Treatment Yes  Post-Hemodialysis Comments  (Pt tolerated treatment without complications)  AVG/AVF Arterial Site Held (minutes) 10 minutes  AVG/AVF Venous Site Held (minutes) 7 minutes  Fistula / Graft Left Upper arm Arteriovenous fistula  Placement Date/Time: 07/24/22 0745   Placed prior to admission: Yes  Orientation: Left  Access Location: Upper arm  Access Type: Arteriovenous fistula  Site Condition No complications  Fistula / Graft Assessment Present;Thrill;Bruit  Status Deaccessed  Needle Size 15  Drainage Description None

## 2024-03-29 NOTE — Assessment & Plan Note (Signed)
 ESRD on HD Tu/Th/Sat. - Nephrology consulted, appreciated recommendations - Daily RPF

## 2024-03-29 NOTE — Progress Notes (Addendum)
 [facility] Progress Note  Subjective:  BP's stable Had HD here overnight early this am  Vitals:   03/29/24 0430 03/29/24 0500 03/29/24 0547 03/29/24 0731  BP: (!) 145/53 124/70 (!) 133/56 (!) 126/46  Pulse: 79 78 81 88  Resp: 13 12 16 18   Temp:  98.1 F (36.7 C) 98.2 F (36.8 C)   TempSrc:  Oral Oral Oral  SpO2: 100% 100% 100% 99%  Weight:      Height:        Exam: Gen alert, no distress, RA No jvd or bruits Chest CTA b/l, no rales RRR no MRG Abd soft ntnd no mass or ascites +bs GU deferred Ext 1 RLE edema, no LLE edema Neuro is alert, Ox 3 , nf LUA AVF+bruit    Home bp meds: Coreg  12.5 bid Demadex  10mg  qam NTG patch Midodrine  10mg  pre hd TTS   OP HD: G-O TTS 4.5h  99 kg   B450    L AVF   Heparin  3000  Mircera 120mcg q2weeks (last 75 given on 9/2) Sensipar  60 three times per week (last given 8/19) Hectorol  IV TIW   Assessment/ Plan: Right knee pain: not septic nor gout, thought to be secondary to osteoarthritis.  Per pmd.  ESRD: on HD TTS. Last HD Friday last week. Had HD here overnight last night. Next HD tomorrow.  HTN: cont home meds prn per pmd BP/vol: chronic hypotension, takes midodrine  10mg  pre HD, will cont here. BP's stable, 3-4 kg up by wt.  Anemia of esrd: Hb 8.3, follow.  Transfuse if under 7 Lumbar radiculopathy: MRI requested; primary care team ruling out cauda equina Combined CHF: gets torsemide  100 mg on nondialysis days   Myer Fret MD  [facility] 03/29/2024, 9:01 AM  Recent Labs  Lab 03/27/24 1912  HGB 8.3*  ALBUMIN  3.8  CALCIUM  8.4*  CREATININE 8.80*  K 4.7   No results for input(s): IRON , TIBC, FERRITIN in the last 168 hours. Inpatient medications:  aspirin  EC  81 mg Oral Daily   atorvastatin   80 mg Oral Daily   [START ON 03/30/2024] calcitRIOL   4 mcg Oral Q T,Th,Sa-HD   carvedilol   12.5 mg Oral BID WC   Chlorhexidine  Gluconate Cloth  6 each Topical Q0600   gabapentin   200 mg Oral Q T,Th,Sa-HD   heparin    5,000 Units Subcutaneous Q8H   hydrOXYzine   25 mg Oral BID   insulin  aspart  0-6 Units Subcutaneous TID WC   [START ON 03/30/2024] midodrine   10 mg Oral Q T,Th,Sa-HD   [START ON 04/04/2024] midodrine   10 mg Oral Q Tue-HD   oxyCODONE   10 mg Oral QID   pantoprazole   40 mg Oral Daily   senna  1 tablet Oral BID   sevelamer  carbonate  1,600 mg Oral TID WC   torsemide   100 mg Oral Once per day on Sunday Monday Wednesday Friday    acetaminophen  **OR** acetaminophen , polyethylene glycol      '

## 2024-03-29 NOTE — Evaluation (Signed)
 Occupational Therapy Evaluation Patient Details Name: [name] MRN: [id number] DOB: [birth year] Today's Date: 03/29/2024   History of Present Illness   68 y.o. female presents to [facility] hospital on 03/27/2024 with acute R knee pain and numbness. PMH: HFpEF, ESRD, HTN, CAD, DM     Clinical Impressions Pt reports ambulating with quad cane at baseline and is ind with ADLs, lives with sister who also drives her to HD. Pt currently lethargic, needing intermittently dozing off during session, per RN pt did not sleep well last night. Pt needs up to mod A for ADLs, minA for bed mobility and min A for transfers with RW. Pt reports numbness in RLE when ambulating and at rest (primarily ankle and foot). Pt presenting with impairments listed below, will follow acutely. Recommend HHOT at d/c.      If plan is discharge home, recommend the following:   A little help with walking and/or transfers;A little help with bathing/dressing/bathroom     Functional Status Assessment   Patient has had a recent decline in their functional status and demonstrates the ability to make significant improvements in function in a reasonable and predictable amount of time.     Equipment Recommendations   BSC/3in1;Other (comment) (RW)     Recommendations for Other Services   PT consult     Precautions/Restrictions   Precautions Precautions: Fall Recall of Precautions/Restrictions: Intact Restrictions Weight Bearing Restrictions Per Provider Order: No     Mobility Bed Mobility Overal bed mobility: Needs Assistance Bed Mobility: Supine to Sit     Supine to sit: Min assist Sit to supine: Min assist   General bed mobility comments: assistance for RLE    Transfers Overall transfer level: Needs assistance Equipment used: Rolling walker (2 wheels) Transfers: Sit to/from Stand Sit to Stand: Min assist                  Balance Overall balance assessment: Needs assistance Sitting-balance  support: No upper extremity supported, Feet supported Sitting balance-Leahy Scale: Good     Standing balance support: Bilateral upper extremity supported, Reliant on assistive device for balance Standing balance-Leahy Scale: Poor                             ADL either performed or assessed with clinical judgement   ADL Overall ADL's : Needs assistance/impaired Eating/Feeding: Set up;Sitting   Grooming: Minimal assistance;Sitting   Upper Body Bathing: Minimal assistance;Sitting   Lower Body Bathing: Moderate assistance;Sitting/lateral leans   Upper Body Dressing : Minimal assistance;Sitting   Lower Body Dressing: Moderate assistance;Sit to/from stand;Sitting/lateral leans   Toilet Transfer: Minimal assistance;Ambulation;Rolling walker (2 wheels)   Toileting- Clothing Manipulation and Hygiene: Moderate assistance       Functional mobility during ADLs: Minimal assistance;Rolling walker (2 wheels)       Vision   Additional Comments: WFL for BADL     Perception Perception: Not tested       Praxis Praxis: Not tested       Pertinent Vitals/Pain Pain Assessment Pain Assessment: Faces Pain Score: 4  Faces Pain Scale: Hurts little more Pain Location: RLE with touching feet and calf Pain Descriptors / Indicators: Aching     Extremity/Trunk Assessment Upper Extremity Assessment Upper Extremity Assessment: Overall WFL for tasks assessed   Lower Extremity Assessment Lower Extremity Assessment: Defer to PT evaluation   Cervical / Trunk Assessment Cervical / Trunk Assessment: Kyphotic   Communication Communication Communication: No apparent  difficulties   Cognition Arousal: Lethargic Behavior During Therapy: Flat affect Cognition: No family/caregiver present to determine baseline, Cognition impaired   Orientation impairments: Situation (cues to recall plan for MRI) Awareness: Online awareness impaired Memory impairment (select all impairments):  Short-term memory Attention impairment (select first level of impairment): Sustained attention                     Following commands: Intact       Cueing  General Comments   Cueing Techniques: Verbal cues  VSS on RA   Exercises     Shoulder Instructions      Home Living Family/patient expects to be discharged to:: Private residence Living Arrangements: Other relatives Available Help at Discharge: Family;Available 24 hours/day Type of Home: House Home Access: Stairs to enter [employer] of Steps: 1 Entrance Stairs-Rails: None Home Layout: Two level;Able to live on main level with bedroom/bathroom (1/2 bath downstairs) Alternate Level Stairs-Number of Steps: 13   Bathroom Shower/Tub: Chief Strategy Officer: Standard Bathroom Accessibility: Yes   Home Equipment: Cane - quad   Additional Comments: info taken from chart review, pt lethargic and unable to provide PLOF      Prior Functioning/Environment Prior Level of Function : Needs assist             Mobility Comments: ambulatory with quad cane at baseline ADLs Comments: Pt reporting grandchildren assist with LB ADLs    OT Problem List: Decreased strength;Decreased range of motion;Decreased activity tolerance;Impaired balance (sitting and/or standing);Decreased cognition;Decreased safety awareness;Cardiopulmonary status limiting activity   OT Treatment/Interventions: Self-care/ADL training;Therapeutic exercise;Energy conservation;DME and/or AE instruction;Therapeutic activities;Patient/family education;Balance training      OT Goals(Current goals can be found in the care plan section)   Acute Rehab OT Goals Patient Stated Goal: decr RLE pain OT Goal Formulation: With patient Time For Goal Achievement: 04/07/24 Potential to Achieve Goals: Good ADL Goals Pt Will Perform Upper Body Dressing: with supervision;sitting Pt Will Perform Lower Body Dressing: with min assist;sit  to/from stand;sitting/lateral leans Pt Will Transfer to Toilet: with modified independence;ambulating;regular height toilet   OT Frequency:  Min 2X/week    Co-evaluation              AM-PAC OT 6 Clicks Daily Activity     Outcome Measure Help from another person eating meals?: None Help from another person taking care of personal grooming?: A Little Help from another person toileting, which includes using toliet, bedpan, or urinal?: A Little Help from another person bathing (including washing, rinsing, drying)?: A Lot Help from another person to put on and taking off regular upper body clothing?: A Little Help from another person to put on and taking off regular lower body clothing?: A Lot 6 Click Score: 17   End of Session Equipment Utilized During Treatment: Rolling walker (2 wheels) Nurse Communication: Mobility status (lethargic)  Activity Tolerance: Patient tolerated treatment well Patient left: in bed;with call bell/phone within reach;with bed alarm set  OT Visit Diagnosis: Unsteadiness on feet (R26.81);Other abnormalities of gait and mobility (R26.89);Repeated falls (R29.6);Muscle weakness (generalized) (M62.81)                Time: 9073-9041 OT Time Calculation (min): 32 min Charges:  OT General Charges $OT Visit: 1 Visit OT Evaluation $OT Eval Moderate Complexity: 1 Mod OT Treatments $Therapeutic Activity: 8-22 mins  Boomer Winders K, OTD, OTR/L SecureChat Preferred Acute Rehab [phone]   Squire Withey K Koonce 03/29/2024, 11:13 AM

## 2024-03-30 ENCOUNTER — Other Ambulatory Visit (HOSPITAL_COMMUNITY): Payer: Self-pay

## 2024-03-30 DIAGNOSIS — M25561 Pain in right knee: Secondary | ICD-10-CM | POA: Diagnosis not present

## 2024-03-30 DIAGNOSIS — N186 End stage renal disease: Secondary | ICD-10-CM | POA: Diagnosis not present

## 2024-03-30 DIAGNOSIS — Z992 Dependence on renal dialysis: Secondary | ICD-10-CM | POA: Diagnosis not present

## 2024-03-30 LAB — CBC
HCT: 24.6 % — ABNORMAL LOW (ref 36.0–46.0)
Hemoglobin: 7.5 g/dL — ABNORMAL LOW (ref 12.0–15.0)
MCH: 26.5 pg (ref 26.0–34.0)
MCHC: 30.5 g/dL (ref 30.0–36.0)
MCV: 86.9 fL (ref 80.0–100.0)
Platelets: 102 K/uL — ABNORMAL LOW (ref 150–400)
RBC: 2.83 MIL/uL — ABNORMAL LOW (ref 3.87–5.11)
RDW: 18 % — ABNORMAL HIGH (ref 11.5–15.5)
WBC: 4.9 K/uL (ref 4.0–10.5)
nRBC: 0 % (ref 0.0–0.2)

## 2024-03-30 LAB — RENAL FUNCTION PANEL
Albumin: 3.3 g/dL — ABNORMAL LOW (ref 3.5–5.0)
Anion gap: 17 — ABNORMAL HIGH (ref 5–15)
BUN: 69 mg/dL — ABNORMAL HIGH (ref 8–23)
CO2: 28 mmol/L (ref 22–32)
Calcium: 8.5 mg/dL — ABNORMAL LOW (ref 8.9–10.3)
Chloride: 92 mmol/L — ABNORMAL LOW (ref 98–111)
Creatinine, Ser: 6.65 mg/dL — ABNORMAL HIGH (ref 0.44–1.00)
GFR, Estimated: 6 mL/min — ABNORMAL LOW (ref >60)
Glucose, Bld: 216 mg/dL — ABNORMAL HIGH (ref 70–99)
Phosphorus: 5.8 mg/dL — ABNORMAL HIGH (ref 2.5–4.6)
Potassium: 4.9 mmol/L (ref 3.5–5.1)
Sodium: 137 mmol/L (ref 135–145)

## 2024-03-30 LAB — HEPATITIS B SURFACE ANTIBODY, QUANTITATIVE: Hep B S AB Quant (Post): 162 m[IU]/mL

## 2024-03-30 LAB — HCV RNA DIAGNOSIS, NAA: HCV RNA, Quantitation: NOT DETECTED [IU]/mL

## 2024-03-30 LAB — GLUCOSE, CAPILLARY: Glucose-Capillary: 237 mg/dL — ABNORMAL HIGH (ref 70–99)

## 2024-03-30 MED ORDER — POLYETHYLENE GLYCOL 3350 17 GM/SCOOP PO POWD
17.0000 g | Freq: Every day | ORAL | 0 refills | Status: AC | PRN
  Filled 2024-03-30: qty 238, 14d supply, fill #0

## 2024-03-30 MED ORDER — SENNA 8.6 MG PO TABS
1.0000 | ORAL_TABLET | Freq: Two times a day (BID) | ORAL | 0 refills | Status: DC
  Filled 2024-03-30: qty 60, 30d supply, fill #0

## 2024-03-30 MED ORDER — OXYCODONE HCL 10 MG PO TABS: 10.0000 mg | ORAL_TABLET | Freq: Four times a day (QID) | ORAL | Status: DC

## 2024-03-30 MED ORDER — INFLUENZA VAC SPLIT HIGH-DOSE 0.5 ML IM SUSY: 0.5000 mL | PREFILLED_SYRINGE | INTRAMUSCULAR | Status: DC

## 2024-03-30 MED ORDER — HEPARIN SODIUM (PORCINE) 1000 UNIT/ML DIALYSIS
2500.0000 [IU] | Freq: Once | INTRAMUSCULAR | Status: AC
  Administered 2024-03-30: 2500 [IU] via INTRAVENOUS_CENTRAL

## 2024-03-30 MED ORDER — HEPARIN SODIUM (PORCINE) 1000 UNIT/ML DIALYSIS: 1000.0000 [IU] | INTRAMUSCULAR | Status: DC | PRN

## 2024-03-30 NOTE — Assessment & Plan Note (Signed)
 Chronic right knee pain d/t osteoarthritis. Right knee XR shows tricompartmental OA w/ moderate joint space narrowing in the medial compartment and no significant finding on right hip XR and not significant finding on lumbar MRI. She was able to walk w/ roller walker and her right knee pain has improved.  - Vital signs per floor - Carb modified diet  - Continue home Oxy 10 mg QID - PT consulted and follow - VTE prophylaxis: SQH - AM RFP - Fall precautions

## 2024-03-30 NOTE — Progress Notes (Signed)
   Durable Medical Equipment (From admission, onward)        Start     Ordered  03/30/24 1110  For home use only DME Bedside commode  Once      Question:  Patient needs a bedside commode to treat with the following condition  Answer:  Knee instability  03/30/24 1111  03/30/24 1110  For home use only DME Walker  Once      Question:  Patient needs a walker to treat with the following condition  Answer:  Knee instability  03/30/24 1111  Patient is confined to one room without a bathroom.

## 2024-03-30 NOTE — Progress Notes (Signed)
 D/C order noted. Contacted [facility] to be advised of pt's d/c today and that pt should resume care on Saturday. Pt was not agreeable to out-pt HD today due to possible transportation barriers.   Randine Mungo Dialysis Navigator [phone]

## 2024-03-30 NOTE — Progress Notes (Signed)
 PT Cancellation Note  Patient Details Name: NIKKO QUAST MRN: [id number] DOB: [birth year]   Cancelled Treatment:    Reason Eval/Treat Not Completed: Other (comment) Pt noted to be OTF at this time.  Will f/u as able.  Richerd Pinal, PT, DPT 03/30/24, 1:14 PM   Richerd CHRISTELLA Pinal 03/30/2024, 1:13 PM

## 2024-03-30 NOTE — Assessment & Plan Note (Signed)
 ESRD on HD Tu/Th/Sat. - Nephrology consulted, appreciated recommendations - Daily RPF

## 2024-03-30 NOTE — Plan of Care (Signed)

## 2024-03-30 NOTE — Progress Notes (Signed)
 Physical Therapy Treatment Patient Details Name: [name] MRN: [id number] DOB: 1955-08-05 Today's Date: 03/30/2024   History of Present Illness 68 y.o. female presents to [facility] hospital on 03/27/2024 with acute R knee pain and numbness. PMH: HFpEF, ESRD, HTN, CAD, DM    PT Comments  Pt seen for PT tx with pt agreeable with encouragement. Pt demonstrates Good sitting balance as she's able to doff socks without LOB. Pt ambulates in hallway with RW & supervision with impaired gait pattern as noted below; PT provides cuing re: gait pattern but pt not open to education at this time. Recommend ongoing PT services to progress gait with LRAD, balance, activity tolerance.    If plan is discharge home, recommend the following: A little help with bathing/dressing/bathroom;Assistance with cooking/housework;Assist for transportation;Help with stairs or ramp for entrance   Can travel by private vehicle     Yes  Equipment Recommendations  Rolling walker (2 wheels);BSC/3in1    Recommendations for Other Services       Precautions / Restrictions Precautions Precautions: Fall Restrictions Weight Bearing Restrictions Per Provider Order: No     Mobility  Bed Mobility Overal bed mobility: Needs Assistance Bed Mobility: Supine to Sit     Supine to sit: Supervision, HOB elevated, Used rails (exit R side of bed)          Transfers Overall transfer level: Needs assistance Equipment used: Rolling walker (2 wheels) Transfers: Sit to/from Stand Sit to Stand: Supervision           General transfer comment: good anterior weight shifting to increase ease of sit>stand transfer, poor eccentric control with stand>sit    Ambulation/Gait Ambulation/Gait assistance: Supervision Gait Distance (Feet): 100 Feet Assistive device: Rolling walker (2 wheels) Gait Pattern/deviations: Decreased step length - right, Decreased step length - left, Decreased dorsiflexion - right, Decreased dorsiflexion -  left, Decreased stride length Gait velocity: decreased     General Gait Details: PT provided cuing re: increased dorsiflexion & heel strike vs sliding feet with pt reporting she's walking & that's enough right now. 1 standing break 2/2 RLE pain.   Stairs Stairs:  (Pt declined attempts; reports she stays on the main level.)           Wheelchair Mobility     Tilt Bed    Modified Rankin (Stroke Patients Only)       Balance Overall balance assessment: Needs assistance Sitting-balance support: Feet supported Sitting balance-Leahy Scale: Good Sitting balance - Comments: pt doffed socks sitting EOB without LOB   Standing balance support: Bilateral upper extremity supported, Reliant on assistive device for balance, During functional activity Standing balance-Leahy Scale: Fair                              Hotel manager: No apparent difficulties  Cognition Arousal: Alert Behavior During Therapy: Flat affect   PT - Cognitive impairments: No apparent impairments                         Following commands: Intact Following commands impaired: Follows one step commands with increased time    Cueing Cueing Techniques: Verbal cues  Exercises      General Comments        Pertinent Vitals/Pain Pain Assessment Pain Assessment: 0-10 Pain Location: R LE, posterior Pain Descriptors / Indicators: Tightness (stiffness) Pain Intervention(s): Monitored during session, Limited activity within patient's tolerance    Home  Living                          Prior Function            PT Goals (current goals can now be found in the care plan section) Acute Rehab PT Goals Patient Stated Goal: to return to independence, improve RLE strength PT Goal Formulation: With patient Time For Goal Achievement: 04/11/24 Potential to Achieve Goals: Good Progress towards PT goals: Progressing toward goals    Frequency    Min  3X/week      PT Plan      Co-evaluation              AM-PAC PT 6 Clicks Mobility   Outcome Measure  Help needed turning from your back to your side while in a flat bed without using bedrails?: None Help needed moving from lying on your back to sitting on the side of a flat bed without using bedrails?: A Little Help needed moving to and from a bed to a chair (including a wheelchair)?: A Little Help needed standing up from a chair using your arms (e.g., wheelchair or bedside chair)?: A Little Help needed to walk in hospital room?: A Little Help needed climbing 3-5 steps with a railing? : A Lot 6 Click Score: 18    End of Session   Activity Tolerance: Patient limited by fatigue;Patient limited by pain Patient left: in bed;with call bell/phone within reach;with bed alarm set;with nursing/sitter in room Nurse Communication: Mobility status PT Visit Diagnosis: Other abnormalities of gait and mobility (R26.89);Muscle weakness (generalized) (M62.81);Pain Pain - Right/Left: Right Pain - part of body: Leg     Time: 8458-8447 PT Time Calculation (min) (ACUTE ONLY): 11 min  Charges:    $Therapeutic Activity: 8-22 mins PT General Charges $$ ACUTE PT VISIT: 1 Visit                     Richerd Pinal, PT, DPT 03/30/24, 3:59 PM   Richerd CHRISTELLA Pinal 03/30/2024, 3:58 PM

## 2024-03-30 NOTE — Assessment & Plan Note (Signed)
 T2DM: CBG x 4 w/ SSI, hold Ozempic 2 mg weekly. Patient w/ elevated BG this morning but states she had Gram crackers w/ peanut butter around 4am before AM Labs Essential HTN: Holding Amlodipine  10 mg tab on non dialysis day in the setting of lower BP and continuing Coreg  12.5 mg BID w/ meal GERD: Continue Protonix  40 mg daily Anxiety: Continue Atarax  25 mg BID  Asthma: Add Albuterol  prn Acute diastolic CHF: Torsemide  100 MG on non-dialysis day HLD: Continue Lipitor  80 mg daily  Osteoarthritis: Oxycodone  10 mg TID, holding tiZANidine  4 mg daily PRN in the setting of Qtc prolongation previously, will repeat EKG Chronic gout due to renal impairment Aortic stenosis : ASA 81 mg daily ESRD (end stage renal disease): Continue T/Th/S HD, continue Renvela  800 mg TID, continue midodrine  10 mg PO 30 mins before iHD COPD: Pt was unaware of diagnosis OSA: Will check if she wears a CPAP

## 2024-03-30 NOTE — TOC Transition Note (Signed)
 Transition of Care [facility]) - Discharge Note   Patient Details  Name: [name] MRN: [id number] Date of Birth: Mar 03, 1956  Transition of [facility]) CM/SW Contact:  Roxie KANDICE Stain, RN Phone Number: 03/30/2024, 1:52 PM   Clinical Narrative:    Patient stable for discharge.  Notified Artavia with [facility] of discharge.  Notified Zack with [facility] of BSC order. Patient to follow up with PCP.  No otherICM (Inpatient Care Management) needs at this time.          Patient Goals and CMS Choice         Return home   Discharge Placement               home        Discharge Plan and Services Additional resources added to the After Visit Summary for                  DME Arranged: Bedside commode DME Agency: [facility] Date DME Agency Contacted: 03/30/24 Time DME Agency Contacted: 1352 Representative spoke with at DME Agency: Zack HH Arranged: PT, OT HH Agency: [facility] Date HH Agency Contacted: 03/30/24 Time HH Agency Contacted: 1352 Representative spoke with at [facility] Agency: [facility]  Social Drivers of Health (SDOH) Interventions SDOH Screenings   Food Insecurity: No Food Insecurity (03/29/2024)  Housing: Low Risk  (03/29/2024)  Transportation Needs: No Transportation Needs (03/29/2024)  Utilities: Not At Risk (03/29/2024)  Alcohol Screen: Low Risk  (03/03/2021)  Depression (PHQ2-9): High Risk (03/03/2021)  Financial Resource Strain: Low Risk (12/01/2023)   Received from [facility]  Physical Activity: Inactive (12/01/2023)   Received from [facility]  Social Connections: Moderately Integrated (03/29/2024)  Stress: No Stress Concern Present (12/01/2023)   [facility]  Tobacco Use: Medium Risk (03/27/2024)  Health Literacy: Adequate Health Literacy (12/01/2023)   Received from [facility]     Readmission Risk Interventions    03/07/2024    4:29 PM  11/01/2022    9:36 AM  Readmission Risk Prevention Plan  Transportation Screening Complete Complete  PCP or Specialist Appt within 3-5 Days  Complete  HRI or Home Care Consult Complete Complete  Social Work Consult for Recovery Care Planning/Counseling Complete Complete  Palliative Care Screening Not Applicable Not Applicable  Medication Review Oceanographer) Complete Complete

## 2024-03-30 NOTE — Discharge Planning (Signed)
 [facility] Patient Discharge Orders - [facility] CLINIC: [facility]  Patient's name: [name] Admit/DC Dates: 03/27/2024 - 03/30/24  DISCHARGE DIAGNOSES: R knee osteoarthritis -> initially unable to bear weight, now ok to walk with walker  Known lumbar radiculopathy -> MRI showed known DDD   HD ORDER CHANGES: Heparin  change: no EDW Change: no *Way up here, but bed weight - call if seems off* Bath Change: no  ANEMIA MANAGEMENT: Aranesp : Given: no   ESA dose for discharge: Same as prior, give missed 9/16 dose IV Iron  dose at discharge: Per protocol Transfusion: Given: no  BONE/MINERAL MEDICATIONS: Hectorol /Calcitriol  change: no Sensipar /Parsabiv change: no  ACCESS INTERVENTION/CHANGE: no Details:  RECENT LABS: Recent Labs  Lab 03/30/24 0515 03/30/24 1009  HGB  --  7.5*  NA 137  --   K 4.9  --   CALCIUM  8.5*  --   PHOS 5.8*  --   ALBUMIN  3.3*  --    IV ANTIBIOTICS: no Details:  OTHER ANTICOAGULATION: no Details:  OTHER/APPTS/LAB ORDERS:   D/C Meds to be reconciled by nurse after every discharge.  Completed By: Izetta Boehringer, PA-C [facility] Pager [phone]   Reviewed by: MD:______ RN_______

## 2024-03-30 NOTE — Progress Notes (Signed)
 Daily Progress Note Intern Pager: [phone]  Patient name: [name] Medical record number: [id number] Date of birth: 1956-04-15 Age: 68 y.o. Gender: female  Primary Care Provider: No primary care provider on file. Consultants: None Code Status: Full  Pt Overview and Major Events to Date:  03/28/24 : Admitted for Right knee pain and weakness  Assessment 68 yo with chronic pain due to knee OA, ESRD on HD, aortic stenosis (moderate to severe) and mitral stenosis presenting with a few days of right lower extremity pain, numbness, and weakness. Negative acute finding on work up. Patient is medically ready for discharge. Will plan on d/c home today after iHD Assessment & Plan Knee pain, right Weakness of lower extremity Chronic right knee pain d/t osteoarthritis. Right knee XR shows tricompartmental OA w/ moderate joint space narrowing in the medial compartment and no significant finding on right hip XR and not significant finding on lumbar MRI. She was able to walk w/ roller walker and her right knee pain has improved.  - Vital signs per floor - Carb modified diet  - Continue home Oxy 10 mg QID - PT consulted and follow - VTE prophylaxis: SQH - AM RFP - Fall precautions  ESRD (end stage renal disease) on dialysis (HCC) ESRD on HD Tu/Th/Sat. - Nephrology consulted, appreciated recommendations - Daily RPF Chronic health problem T2DM: CBG x 4 w/ SSI, hold Ozempic 2 mg weekly. Patient w/ elevated BG this morning but states she had Gram crackers w/ peanut butter around 4am before AM Labs Essential HTN: Holding Amlodipine  10 mg tab on non dialysis day in the setting of lower BP and continuing Coreg  12.5 mg BID w/ meal GERD: Continue Protonix  40 mg daily Anxiety: Continue Atarax  25 mg BID  Asthma: Add Albuterol  prn Acute diastolic CHF: Torsemide  100 MG on non-dialysis day HLD: Continue Lipitor  80 mg daily  Osteoarthritis: Oxycodone  10 mg TID, holding tiZANidine  4 mg daily PRN in  the setting of Qtc prolongation previously, will repeat EKG Chronic gout due to renal impairment Aortic stenosis : ASA 81 mg daily ESRD (end stage renal disease): Continue T/Th/S HD, continue Renvela  800 mg TID, continue midodrine  10 mg PO 30 mins before iHD COPD: Pt was unaware of diagnosis OSA: Will check if she wears a CPAP  FEN/GI: Carb modified diet  PPx: SQH Dispo:Home   Subjective:  Doing well this morning. Right knee pain improved. Pt states would like to have iHD at the hospital before discharge home.   Objective: Temp:  [98 F (36.7 C)-98.6 F (37 C)] 98 F (36.7 C) (09/18 0412) Pulse Rate:  [75-84] 84 (09/18 0737) Resp:  [16-18] 18 (09/18 0737) BP: (105-137)/(33-53) 137/52 (09/18 0737) SpO2:  [94 %-100 %] 100 % (09/18 0737)  Physical Exam Constitutional:      Appearance: Normal appearance. She is obese.  Cardiovascular:     Rate and Rhythm: Normal rate and regular rhythm.     Pulses: Normal pulses.          Dorsalis pedis pulses are 2+ on the right side and 2+ on the left side.       Posterior tibial pulses are 2+ on the right side and 2+ on the left side.     Heart sounds: Normal heart sounds.  Pulmonary:     Effort: Pulmonary effort is normal.     Breath sounds: Normal breath sounds.  Abdominal:     Palpations: Abdomen is soft.  Musculoskeletal:  General: mild Tenderness (Right knee) present.     Left Lower Extremity: (first toe) Neurological:     General: No focal deficit present.     Mental Status: She is alert and oriented to person, place, and time.     Sensory: Sensory deficit (bilateral plantars) present.     Laboratory: Most recent CBC Lab Results  Component Value Date   WBC 6.5 03/27/2024   HGB 8.3 (L) 03/27/2024   HCT 27.6 (L) 03/27/2024   MCV 86.8 03/27/2024   PLT 133 (L) 03/27/2024   Most recent BMP    Latest Ref Rng & Units 03/30/2024    5:15 AM  BMP  Glucose 70 - 99 mg/dL 783   BUN 8 - 23 mg/dL 69   Creatinine 9.55 - 1.00  mg/dL 3.34   Sodium 864 - 854 mmol/L 137   Potassium 3.5 - 5.1 mmol/L 4.9   Chloride 98 - 111 mmol/L 92   CO2 22 - 32 mmol/L 28   Calcium  8.9 - 10.3 mg/dL 8.5     Other pertinent labs A1C 7   Imaging/Diagnostic Tests: EXAM: DG HIP (WITH OR WITHOUT PELVIS) 2-3V RIGHT  IMPRESSION: No acute fracture, pelvic bone diastasis, or dislocation.  Narrative & Impression MR LUMBAR SPINE WITHOUT IV CONTRAST   IMPRESSION: Degenerative disc desiccation and moderate severe facet arthrosis most notably at L5-S1. There is mild foraminal narrowing at L4-5 and L5-S1. Correlation for mild radiculopathy as above. There is no high-grade foraminal or spinal stenosis. No acute abnormality is appreciated.   Multiple renal cysts with varying degrees of complexity.   Electronically signed by: Norleen Satchel MD 03/29/2024 04:39 PM EDT RP Workstation: [id number]  Suzen Houston NOVAK, DO 03/30/2024, 9:22 AM  PGY-1, [facility] FPTS Intern pager: [phone], text pages welcome Secure chat group [facility] Teaching Service

## 2024-03-30 NOTE — Care Management Obs Status (Signed)
 MEDICARE OBSERVATION STATUS NOTIFICATION   Patient Details  Name: [name] MRN: [id number] Date of Birth: [birth year]   Medicare Observation Status Notification Given:     Obs notice signed and copy given   Claretta Deed 03/30/2024, 4:05 PM

## 2024-03-30 NOTE — Progress Notes (Signed)
 [facility] Progress Note   Subjective:  Seen in room and then now on HD - 2L UFG and tolerating. No CP/dyspnea. Leg pain is a little better. Plan is to discharge home today after HD.  Objective Vitals:   03/30/24 0737 03/30/24 1003 03/30/24 1012 03/30/24 1030  BP: (!) 137/52 (!) 139/57 (!) 143/56 (!) 171/69  Pulse: 84 82 77 83  Resp: 18 15 13 13   Temp: 98 F (36.7 C) 98.5 F (36.9 C)    TempSrc: Oral     SpO2: 100% 99% 94% 92%  Weight:  108.8 kg    Height:       Physical Exam General: Well appearing woman, NAD. Room air Heart: RRR; no mur Lungs: CTA anteriorly Abdomen: soft Extremities: 1+ RLE edema, no LLE edema Dialysis Access:  LUE AVF +t/b  Additional Objective Labs: Basic Metabolic Panel: Recent Labs  Lab 03/27/24 1912 03/29/24 0744 03/30/24 0515  NA 136 137 137  K 4.7 3.9 4.9  CL 94* 95* 92*  CO2 22 25 28   GLUCOSE 221* 357* 216*  BUN 106* 52* 69*  CREATININE 8.80* 5.35* 6.65*  CALCIUM  8.4* 8.2* 8.5*  PHOS  --  4.1 5.8*   Liver Function Tests: Recent Labs  Lab 03/27/24 1912 03/29/24 0744 03/30/24 0515  AST <10*  --   --   ALT 24  --   --   ALKPHOS 198*  --   --   BILITOT 0.5  --   --   PROT 6.9  --   --   ALBUMIN  3.8 3.1* 3.3*   CBC: Recent Labs  Lab 03/27/24 1912  WBC 6.5  NEUTROABS 3.8  HGB 8.3*  HCT 27.6*  MCV 86.8  PLT 133*   Blood Culture    Component Value Date/Time   SDES SYNOVIAL 03/28/2024 1045   SPECREQUEST RIGHT KNEE 03/28/2024 1045   CULT  03/28/2024 1045    NO GROWTH 2 DAYS Performed at [facility], [address] [place], [place] [address]    REPTSTATUS PENDING 03/28/2024 1045   CBG: Recent Labs  Lab 03/29/24 0729 03/29/24 1220 03/29/24 1716 03/29/24 1957 03/30/24 0740  GLUCAP 352* 148* 260* 276* 237*    Studies/Results: VAS US  LOWER EXTREMITY VENOUS (DVT) Result Date: 03/30/2024  Lower Venous DVT Study Patient Name:  [name]  Date of Exam:   03/29/2024 Medical Rec #:  [id number]      Accession #:    [id number] Date of Birth: [birth year]      Patient Gender: F Patient Age:   68 years Exam Location:  [facility] Procedure:      VAS US  LOWER EXTREMITY VENOUS (DVT) Referring Phys: CARINA BROWN --------------------------------------------------------------------------------  Indications: Pain.  Limitations: Body habitus and poor ultrasound/tissue interface. Comparison Study: Previous exam on 04/23/2021 was negative for DVT Performing Technologist: Ezzie Potters RVT, RDMS  Examination Guidelines: A complete evaluation includes B-mode imaging, spectral Doppler, color Doppler, and power Doppler as needed of all accessible portions of each vessel. Bilateral testing is considered an integral part of a complete examination. Limited examinations for reoccurring indications may be performed as noted. The reflux portion of the exam is performed with the patient in reverse Trendelenburg.  +---------+---------------+---------+-----------+----------+--------------+ RIGHT    CompressibilityPhasicitySpontaneityPropertiesThrombus Aging +---------+---------------+---------+-----------+----------+--------------+ CFV      Full           Yes      Yes                                 +---------+---------------+---------+-----------+----------+--------------+  SFJ      Full                                                        +---------+---------------+---------+-----------+----------+--------------+ FV Prox  Full           Yes      Yes                                 +---------+---------------+---------+-----------+----------+--------------+ FV Mid   Full           Yes      Yes                                 +---------+---------------+---------+-----------+----------+--------------+ FV DistalFull           Yes      Yes                                 +---------+---------------+---------+-----------+----------+--------------+ PFV      Full                                                         +---------+---------------+---------+-----------+----------+--------------+ POP      Full           Yes      Yes                                 +---------+---------------+---------+-----------+----------+--------------+ PTV      Full                                                        +---------+---------------+---------+-----------+----------+--------------+ PERO     Full                                                        +---------+---------------+---------+-----------+----------+--------------+   +----+---------------+---------+-----------+----------+--------------+ LEFTCompressibilityPhasicitySpontaneityPropertiesThrombus Aging +----+---------------+---------+-----------+----------+--------------+ CFV Full           Yes      Yes                                 +----+---------------+---------+-----------+----------+--------------+     Summary: RIGHT: - There is no evidence of deep vein thrombosis in the lower extremity.  - No cystic structure found in the popliteal fossa.  LEFT: - No evidence of common femoral vein obstruction.   *See table(s) above for measurements and observations. Electronically signed by Fonda Rim on 03/30/2024 at 10:44:17 AM.    Final    MR LUMBAR SPINE WO CONTRAST Result Date:  03/29/2024 MR LUMBAR SPINE WITHOUT IV CONTRAST COMPARISON: None available CLINICAL HISTORY: Lumbar radiculopathy. TECHNIQUE: SAG T2, SAG T1, SAG STIR, AX T2, AX T1 without IV contrast. FINDINGS: There is normal alignment of the lumbar spine. There is no vertebral body height loss, subluxation or marrow replacing process. The sacrum and SI joints are unremarkable so far as visualized. Conus and cauda equina are unremarkable. T12-L1: There is no focal disc protrusion, foraminal or spinal stenosis. Mild facet arthrosis. L1-2: There is no focal disc protrusion, foraminal or spinal stenosis. Mild facet arthrosis. L2-3: There is no  focal disc protrusion, foraminal or spinal stenosis. Mild facet arthrosis. L3-4: There is no focal disc protrusion, foraminal or spinal stenosis. Mild facet arthrosis. L4-5: Mild broad-based disc osteophyte effacing the ventral thecal sac. Mild-to-moderate facet arthrosis. There is caudal foraminal narrowing, left greater than right. There is mild abutment of the exiting left L4 nerve. Correlation for L4 radiculopathy. L5-S1: Mild broad-based disc osteophyte moderate severe facet arthrosis. There is caudal from narrowing, right greater than left. There is abutment of the exiting L5 nerves. No significant spinal stenosis. There are multiple renal cysts with varying degrees of complexity. Otherwise, limited evaluation of the retroperitoneal structures is unremarkable. IMPRESSION: Degenerative disc desiccation and moderate severe facet arthrosis most notably at L5-S1. There is mild foraminal narrowing at L4-5 and L5-S1. Correlation for mild radiculopathy as above. There is no high-grade foraminal or spinal stenosis. No acute abnormality is appreciated. Multiple renal cysts with varying degrees of complexity. Electronically signed by: Norleen Satchel MD 03/29/2024 04:39 PM EDT RP Workstation: [id number]   DG HIP UNILAT WITH PELVIS 2-3 VIEWS RIGHT Result Date: 03/28/2024 CLINICAL DATA:  [id number] Hip pain [id number] EXAM: DG HIP (WITH OR WITHOUT PELVIS) 2-3V RIGHT COMPARISON:  07/26/2022 FINDINGS: No evidence of pelvic fracture or diastasis.No acute hip fracture or dislocation.There is no evidence of arthropathy or other focal bone abnormality. Diffuse aortoiliac and peripheral vascular atherosclerosis. IMPRESSION: No acute fracture, pelvic bone diastasis, or dislocation. Electronically Signed   By: Rogelia Myers M.D.   On: 03/28/2024 18:33   Medications:   aspirin  EC  81 mg Oral Daily   atorvastatin   80 mg Oral Daily   calcitRIOL   4 mcg Oral Q T,Th,Sa-HD   carvedilol   12.5 mg Oral BID WC   Chlorhexidine  Gluconate  Cloth  6 each Topical Q0600   Chlorhexidine  Gluconate Cloth  6 each Topical Q0600   gabapentin   200 mg Oral Q T,Th,Sa-HD   heparin   5,000 Units Subcutaneous Q8H   hydrOXYzine   25 mg Oral BID   [START ON 03/31/2024] Influenza vac split trivalent PF  0.5 mL Intramuscular Tomorrow-1000   insulin  aspart  0-6 Units Subcutaneous TID WC   midodrine   10 mg Oral Q T,Th,Sa-HD   pantoprazole   40 mg Oral Daily   senna  1 tablet Oral BID   sevelamer  carbonate  1,600 mg Oral TID WC   torsemide   100 mg Oral Once per day on Sunday Monday Wednesday Friday   Dialysis Orders G-O TTS 4.5h  99 kg   B450    L AVF   Heparin  3000  Mircera 120mcg q2weeks (last 75 given on 9/2) Sensipar  60 three times per week (last given 8/19) Hectorol  IV TIW     Assessment/ Plan: Right knee pain: not septic nor gout, thought to be secondary to osteoarthritis.  Per pmd.  ESRD: on HD TTS. On HD now - likely discharge after. HTN: cont home meds prn per pmd BP/vol: chronic  hypotension, takes midodrine  10mg  pre HD - continue here. Anemia of esrd: Hb 8.3 - follow. Lumbar radiculopathy: MRI completed, does have DDD, no cauda equina syndrome. Combined CHF: gets torsemide  100 mg on nondialysis days   Heather Boehringer, PA-C 03/30/2024, 10:47 AM  [facility]

## 2024-03-31 ENCOUNTER — Telehealth: Payer: Self-pay | Admitting: Nephrology

## 2024-03-31 LAB — BODY FLUID CULTURE W GRAM STAIN: Culture: NO GROWTH

## 2024-03-31 NOTE — Telephone Encounter (Signed)
 Transition of Care - Initial Contact after Hospitalization  Date of discharge:  03/30/24 Date of contact: 03/31/24  Method: Phone Spoke to: Patient  Patient contacted to discuss transition of care from recent inpatient hospitalization. Patient was admitted to [facility] from to 9/15 - 03/30/24 with discharge diagnosis of right knee pain.   The discharge medication list was reviewed.   Patient will return to his/her outpatient HD unit on: Saturday 9/20  No other concerns at this time.

## 2024-04-21 ENCOUNTER — Other Ambulatory Visit (HOSPITAL_COMMUNITY): Payer: Self-pay

## 2024-05-04 ENCOUNTER — Ambulatory Visit (HOSPITAL_COMMUNITY)
Admission: EM | Admit: 2024-05-04 | Discharge: 2024-05-04 | Disposition: A | Discharge status: Home / Self Care | Attending: Family Medicine | Admitting: Family Medicine

## 2024-05-04 ENCOUNTER — Other Ambulatory Visit: Payer: Self-pay

## 2024-05-04 ENCOUNTER — Encounter (HOSPITAL_COMMUNITY): Payer: Self-pay | Admitting: Emergency Medicine

## 2024-05-04 ENCOUNTER — Ambulatory Visit (INDEPENDENT_AMBULATORY_CARE_PROVIDER_SITE_OTHER)

## 2024-05-04 DIAGNOSIS — J189 Pneumonia, unspecified organism: Secondary | ICD-10-CM | POA: Diagnosis not present

## 2024-05-04 DIAGNOSIS — R051 Acute cough: Secondary | ICD-10-CM

## 2024-05-04 MED ORDER — HYDROCODONE BIT-HOMATROP MBR 5-1.5 MG/5ML PO SOLN
5.0000 mL | Freq: Four times a day (QID) | ORAL | 0 refills | Status: DC | PRN
Start: 2024-05-04 — End: 2024-05-12

## 2024-05-04 MED ORDER — LEVOFLOXACIN 500 MG PO TABS: 500.0000 mg | ORAL_TABLET | Freq: Every day | ORAL | 0 refills | Status: DC

## 2024-05-04 NOTE — ED Provider Notes (Signed)
 [facility]   [id number] 05/04/24 Arrival Time: 1526  ASSESSMENT & PLAN:  1. Acute cough   2. Pneumonia of both lower lobes due to infectious organism    I have personally viewed and independently interpreted the imaging studies ordered this visit. CXR: ques slight infiltrates at lung bases.  Without resp distress. Bad cough. Will tx for CAP.  Meds ordered this encounter  Medications   levofloxacin  (LEVAQUIN ) 500 MG tablet    Sig: Take 1 tablet (500 mg total) by mouth daily for 5 days.    Dispense:  5 tablet    Refill:  0   HYDROcodone  bit-homatropine (HYCODAN) 5-1.5 MG/5ML syrup    Sig: Take 5 mLs by mouth every 6 (six) hours as needed for cough.    Dispense:  90 mL    Refill:  0   Cough med sedation precautions.   Follow-up Information     [place] [facility] at [facility].   Specialty: Emergency Medicine Why: If worsening or failing to improve as anticipated. Contact information: [address] [place]  [address] [phone]                Reviewed expectations re: course of current medical issues. Questions answered. Outlined signs and symptoms indicating need for more acute intervention. Understanding verbalized. After Visit Summary given.   SUBJECTIVE: History from: Patient. [name] is a 68 y.o. female. Symptoms started 1 1/2 weeks ago.  Patient has a cough, thick, yellow phlegm.  Patient has had a runny nose, chest and throat soreness.    Has chronic pain in knee and this is hurting .    Patient is not taking any medications for symptoms Denies: fever. Normal PO intake without n/v/d.  OBJECTIVE:  Vitals:   05/04/24 1619  BP: (!) 108/58  Pulse: 75  Resp: 20  Temp: 98.7 F (37.1 C)  TempSrc: Oral  SpO2: 92%    General appearance: alert; no distress Eyes: PERRLA; EOMI; conjunctiva normal HENT: [place]; AT; without nasal congestion Neck: supple  Lungs: speaks full sentences without  difficulty; unlabored; deep wet coughing; coarse breath sounds at bilateral lung bases Extremities: no edema Skin: warm and dry Psychological: alert and cooperative; normal mood and affect  Imaging: DG Chest 2 View Result Date: 05/04/2024 CLINICAL DATA:  Cough. EXAM: CHEST - 2 VIEW COMPARISON:  Chest radiograph dated 03/03/2024. FINDINGS: Significant improvement with near complete clearing of the previously seen pulmonary opacities. Bibasilar densities may represent atelectasis/scarring. Pneumonia is not excluded. A small right pleural effusion suspected. No pneumothorax. The cardiac silhouette is within normal limits. Coronary vascular calcification or stent. No acute osseous pathology. IMPRESSION: Bibasilar atelectasis/scarring. Pneumonia is not excluded. Electronically Signed   By: Vanetta Chou M.D.   On: 05/04/2024 17:10    Allergies  Allergen Reactions   Shellfish Allergy Anaphylaxis and Swelling   Diazepam  Other (See Comments)    felt like out of body experience   Morphine  Itching and Other (See Comments)    Tolerated hydromorphone  on 07/2020 *will take along with Benadryl *    Past Medical History:  Diagnosis Date   Anemia    Aortic stenosis    Echo 8/18: mean 13, peak 28, LVOT/AV mean velocity 0.51   Arthritis    Asthma    As a child    Bronchitis    CAD (coronary artery disease)    a. 09/2016: 50% Ost 1st Mrg stenosis, 50% 2nd Mrg stenosis, 20% Mid-Cx, 95% Prox LAD,  40% mid-LAD, and 10% dist-LAD stenosis. Staged PCI with DES to Prox-LAD.    Chronic combined systolic and diastolic CHF (congestive heart failure) (HCC) 2011   echo 2/18: EF 55-60, normal wall motion, grade 2 diastolic dysfunction, trivial AI // echo 3/18: Septal and apical HK, EF 45-50, normal wall motion, trivial AI, mild LAE, PASP 38 // echo 8/18: EF 60-65, normal wall motion, grade 1 diastolic dysfunction, calcified aortic valve leaflets, mild aortic stenosis (mean 13, peak 28, LVOT/AV mean velocity  0.51), mild AI, moderate MAC, mild LAE, trivial TR    Chronic kidney disease    STAGE 4   Chronic kidney disease on chronic dialysis (HCC)    t, th, sat   Complication of anesthesia    Depression    Diabetes mellitus Dx 1989   Elevated lipids    GERD (gastroesophageal reflux disease)    Gout    Heart murmur    asymptomatic   Hepatitis C Dx 2013   Hypertension Dx 1989   Infected surgical wound    Lt arm   Myocardial infarction (HCC) 07/2015   Obesity    Pancreatitis 2013   Pneumonia    Refusal of blood transfusions as patient is Jehovah's Witness    pt states she is not [place] witness and does not refuse blood products   Tendinitis    Tremors of nervous system    LEFT HAND   Ulcer 2010   Social History   Socioeconomic History   Marital status: Divorced    Spouse name: Not on file   Number of children: 3   Years of education: Not on file   Highest education level: Bachelor's degree (e.g., BA, AB, BS)  Occupational History   Occupation: disability  Tobacco Use   Smoking status: Former    Current packs/day: 0.00    Average packs/day: 0.3 packs/day for 6.0 years (1.5 ttl pk-yrs)    Types: Cigarettes    Start date: 10/26/1974    Quit date: 10/25/1980    Years since quitting: 43.5   Smokeless tobacco: Never  Vaping Use   Vaping status: Never Used  Substance and Sexual Activity   Alcohol use: Not Currently    Comment: rare   Drug use: Not Currently    Types: Marijuana   Sexual activity: Not on file    Comment: Not asked  Other Topics Concern   Not on file  Social History Narrative   Not on file   Social Drivers of Health   Financial Resource Strain: Low Risk (12/01/2023)   Received from [facility]   Overall Financial Resource Strain (CARDIA)    Difficulty of Paying Living Expenses: Not hard at all  Food Insecurity: No Food Insecurity (03/29/2024)   Hunger Vital Sign    Worried About Running Out of Food in the Last Year: Never true    Ran  Out of Food in the Last Year: Never true  Transportation Needs: No Transportation Needs (03/29/2024)   PRAPARE - Administrator, Civil Service (Medical): No    Lack of Transportation (Non-Medical): No  Physical Activity: Inactive (12/01/2023)   Received from [facility]   Exercise Vital Sign    On average, how many days per week do you engage in moderate to strenuous exercise (like a brisk walk)?: 0 days    On average, how many minutes do you engage in exercise at this level?: 0 min  Stress: No Stress Concern Present (  12/01/2023)   Received from [facility]   [employer] of Occupational Health - Occupational Stress Questionnaire    Feeling of Stress : Not at all  Social Connections: Moderately Integrated (03/29/2024)   Social Connection and Isolation Panel    Frequency of Communication with Friends and Family: More than three times a week    Frequency of Social Gatherings with Friends and Family: More than three times a week    Attends Religious Services: 1 to 4 times per year    Active Member of [employer] or Organizations: Yes    Attends Banker Meetings: More than 4 times per year    Marital Status: Widowed  Intimate Partner Violence: Not At Risk (03/29/2024)   Humiliation, Afraid, Rape, and Kick questionnaire    Fear of Current or Ex-Partner: No    Emotionally Abused: No    Physically Abused: No    Sexually Abused: No   Family History  Problem Relation Age of Onset   Colon cancer Mother    Heart attack Other    Heart attack Maternal Grandmother    Hypertension Sister    Hypertension Brother    Diabetes Paternal Grandmother    Breast cancer Neg Hx    Past Surgical History:  Procedure Laterality Date   A/V FISTULAGRAM Left 04/11/2019   Procedure: A/V FISTULAGRAM;  Surgeon: Jama Cordella MATSU, MD;  Location: [facility] INVASIVE CV LAB;  Service: Cardiovascular;  Laterality: Left;   A/V FISTULAGRAM Left 06/02/2019    Procedure: A/V FISTULAGRAM;  Surgeon: Jama Cordella MATSU, MD;  Location: [facility];  Service: Cardiovascular;  Laterality: Left;   AMPUTATION Left 10/28/2022   Procedure: LEFT FOOT 1ST [place] AMPUTATION;  Surgeon: Harden Jerona GAILS, MD;  Location: [facility] OR;  Service: Orthopedics;  Laterality: Left;   APPLICATION OF WOUND VAC Left 08/14/2017   Procedure: APPLICATION OF WOUND VAC Exchange;  Surgeon: Dessa Reyes ORN, MD;  Location: [facility] ORS;  Service: General;  Laterality: Left;   APPLICATION OF WOUND VAC Left 12/21/2018   Procedure: APPLICATION OF WOUND VAC;  Surgeon: Jama Cordella MATSU, MD;  Location: [facility] ORS;  Service: Vascular;  Laterality: Left;   AV FISTULA PLACEMENT Left 08/19/2018   Procedure: ARTERIOVENOUS (AV) FISTULA CREATION ( BRACHIOBASILIC );  Surgeon: Jama Cordella MATSU, MD;  Location: [facility] ORS;  Service: Vascular;  Laterality: Left;   BASCILIC VEIN TRANSPOSITION Left 11/18/2018   Procedure: BASCILIC VEIN TRANSPOSITION;  Surgeon: Jama Cordella MATSU, MD;  Location: [facility] ORS;  Service: Vascular;  Laterality: Left;   BIOPSY  09/20/2020   Procedure: BIOPSY;  Surgeon: Rollin Dover, MD;  Location[facility] ENDOSCOPY;  Service: Endoscopy;;   CHOLECYSTECTOMY     COLONOSCOPY WITH PROPOFOL  N/A 02/03/2018   Procedure: COLONOSCOPY WITH PROPOFOL ;  Surgeon: Unk Corinn Skiff, MD;  Location: [facility] ENDOSCOPY;  Service: Gastroenterology;  Laterality: N/A;   COLONOSCOPY WITH PROPOFOL  N/A 07/25/2022   Procedure: COLONOSCOPY WITH PROPOFOL ;  Surgeon: Maryruth Ole DASEN, MD;  Location: [facility] ENDOSCOPY;  Service: Endoscopy;  Laterality: N/A;   CORONARY ANGIOPLASTY  07/2015   STENT   CORONARY STENT INTERVENTION N/A 09/18/2016   Procedure: Coronary Stent Intervention;  Surgeon: Debby DELENA Sor, MD;  Location: [facility];  Service: Cardiovascular;  Laterality: N/A;   DIALYSIS/PERMA CATHETER INSERTION N/A 05/10/2018   Procedure: DIALYSIS/PERMA CATHETER INSERTION;  Surgeon: Jama Cordella MATSU, MD;  Location: [facility]  INVASIVE CV LAB;  Service: Cardiovascular;  Laterality: N/A;   DIALYSIS/PERMA CATHETER INSERTION N/A 01/21/2024  Procedure: DIALYSIS/PERMA CATHETER INSERTION;  Surgeon: Lanis Fonda BRAVO, MD;  Location: [facility];  Service: Cardiovascular;  Laterality: N/A;   DRESSING CHANGE UNDER ANESTHESIA Left 08/15/2017   Procedure: exploration of wound for bleeding;  Surgeon: Dessa Reyes ORN, MD;  Location: [facility] ORS;  Service: General;  Laterality: Left;   ESOPHAGOGASTRODUODENOSCOPY (EGD) WITH PROPOFOL  N/A 02/03/2018   Procedure: ESOPHAGOGASTRODUODENOSCOPY (EGD) WITH PROPOFOL ;  Surgeon: Unk Corinn Skiff, MD;  Location: [facility] ENDOSCOPY;  Service: Gastroenterology;  Laterality: N/A;   ESOPHAGOGASTRODUODENOSCOPY (EGD) WITH PROPOFOL  N/A 09/20/2020   Procedure: ESOPHAGOGASTRODUODENOSCOPY (EGD) WITH PROPOFOL ;  Surgeon: Rollin Dover, MD;  Location: [facility] ENDOSCOPY;  Service: Endoscopy;  Laterality: N/A;   ESOPHAGOGASTRODUODENOSCOPY (EGD) WITH PROPOFOL  N/A 07/25/2022   Procedure: ESOPHAGOGASTRODUODENOSCOPY (EGD) WITH PROPOFOL ;  Surgeon: Maryruth Ole DASEN, MD;  Location: [facility] ENDOSCOPY;  Service: Endoscopy;  Laterality: N/A;   EYE SURGERY  11/17/2018   INCISION AND DRAINAGE ABSCESS Left 08/12/2017   Procedure: INCISION AND DRAINAGE ABSCESS;  Surgeon: Dessa Reyes ORN, MD;  Location: [facility] ORS;  Service: General;  Laterality: Left;   KNEE ARTHROSCOPY     LEFT HEART CATH N/A 09/18/2016   Procedure: Left Heart Cath;  Surgeon: Debby DELENA Sor, MD;  Location: [facility];  Service: Cardiovascular;  Laterality: N/A;   LEFT HEART CATH AND CORONARY ANGIOGRAPHY N/A 09/16/2016   Procedure: Left Heart Cath and Coronary Angiography;  Surgeon: Lonni JONETTA Cash, MD;  Location[facility] INVASIVE CV LAB;  Service: Cardiovascular;  Laterality: N/A;   LEFT HEART CATH AND CORONARY ANGIOGRAPHY N/A 04/29/2017   Procedure: LEFT HEART CATH AND CORONARY ANGIOGRAPHY;  Surgeon: Mady Lonni, MD;  Location: [facility];  Service:  Cardiovascular;  Laterality: N/A;   LOWER EXTREMITY ANGIOGRAPHY Right 03/08/2018   Procedure: LOWER EXTREMITY ANGIOGRAPHY;  Surgeon: Jama Cordella MATSU, MD;  Location: [facility];  Service: Cardiovascular;  Laterality: Right;   PERIPHERAL VASCULAR THROMBECTOMY  01/21/2024   Procedure: PERIPHERAL VASCULAR THROMBECTOMY;  Surgeon: Lanis Fonda BRAVO, MD;  Location: [facility];  Service: Cardiovascular;;   TUBAL LIGATION     TUBAL LIGATION     UPPER EXTREMITY ANGIOGRAPHY Right 09/19/2019   Procedure: UPPER EXTREMITY ANGIOGRAPHY;  Surgeon: Jama Cordella MATSU, MD;  Location: [facility];  Service: Cardiovascular;  Laterality: Right;   VENOUS STENT  01/21/2024   Procedure: VENOUS STENT;  Surgeon: Lanis Fonda BRAVO, MD;  Location: [facility];  Service: Cardiovascular;;   WOUND DEBRIDEMENT Left 12/21/2018   Procedure: DEBRIDEMENT WOUND;  Surgeon: Jama Cordella MATSU, MD;  Location: [facility] ORS;  Service: Vascular;  Laterality: Left;   WOUND DEBRIDEMENT Left 12/30/2018   Procedure: DEBRIDEMENT WOUND WITH VAC PLACEMENT (LEFT UPPER EXTREMITY);  Surgeon: Jama Cordella MATSU, MD;  Location: [facility] ORS;  Service: Vascular;  Laterality: Left;     Rolinda Rogue, MD 05/04/24 [phone]

## 2024-05-04 NOTE — ED Triage Notes (Signed)
 Symptoms started 1 1/2 weeks ago.  Patient has a cough, thick, yellow phlegm.  Patient has had a runny nose, chest and throat soreness.    Has chronic pain in knee and this is hurting .    Patient is not taking any medications for symptoms

## 2024-05-08 ENCOUNTER — Observation Stay (HOSPITAL_COMMUNITY): Admission: EM | Admit: 2024-05-08 | Discharge: 2024-05-12 | Disposition: A | Discharge status: Home / Self Care

## 2024-05-08 ENCOUNTER — Other Ambulatory Visit: Payer: Self-pay

## 2024-05-08 ENCOUNTER — Emergency Department (HOSPITAL_COMMUNITY)

## 2024-05-08 ENCOUNTER — Encounter (HOSPITAL_COMMUNITY): Payer: Self-pay

## 2024-05-08 DIAGNOSIS — D649 Anemia, unspecified: Secondary | ICD-10-CM | POA: Diagnosis not present

## 2024-05-08 DIAGNOSIS — Z79899 Other long term (current) drug therapy: Secondary | ICD-10-CM | POA: Insufficient documentation

## 2024-05-08 DIAGNOSIS — K219 Gastro-esophageal reflux disease without esophagitis: Secondary | ICD-10-CM | POA: Insufficient documentation

## 2024-05-08 DIAGNOSIS — I5030 Unspecified diastolic (congestive) heart failure: Secondary | ICD-10-CM | POA: Insufficient documentation

## 2024-05-08 DIAGNOSIS — J189 Pneumonia, unspecified organism: Secondary | ICD-10-CM | POA: Diagnosis not present

## 2024-05-08 DIAGNOSIS — E8770 Fluid overload, unspecified: Secondary | ICD-10-CM | POA: Diagnosis not present

## 2024-05-08 DIAGNOSIS — I132 Hypertensive heart and chronic kidney disease with heart failure and with stage 5 chronic kidney disease, or end stage renal disease: Secondary | ICD-10-CM | POA: Insufficient documentation

## 2024-05-08 DIAGNOSIS — D631 Anemia in chronic kidney disease: Secondary | ICD-10-CM | POA: Diagnosis not present

## 2024-05-08 DIAGNOSIS — E875 Hyperkalemia: Secondary | ICD-10-CM | POA: Diagnosis not present

## 2024-05-08 DIAGNOSIS — F419 Anxiety disorder, unspecified: Secondary | ICD-10-CM | POA: Insufficient documentation

## 2024-05-08 DIAGNOSIS — R0602 Shortness of breath: Secondary | ICD-10-CM | POA: Diagnosis present

## 2024-05-08 DIAGNOSIS — E1122 Type 2 diabetes mellitus with diabetic chronic kidney disease: Secondary | ICD-10-CM | POA: Insufficient documentation

## 2024-05-08 DIAGNOSIS — M170 Bilateral primary osteoarthritis of knee: Secondary | ICD-10-CM | POA: Diagnosis not present

## 2024-05-08 DIAGNOSIS — N186 End stage renal disease: Secondary | ICD-10-CM | POA: Insufficient documentation

## 2024-05-08 DIAGNOSIS — I2510 Atherosclerotic heart disease of native coronary artery without angina pectoris: Secondary | ICD-10-CM | POA: Diagnosis not present

## 2024-05-08 DIAGNOSIS — G8929 Other chronic pain: Secondary | ICD-10-CM | POA: Insufficient documentation

## 2024-05-08 DIAGNOSIS — J449 Chronic obstructive pulmonary disease, unspecified: Secondary | ICD-10-CM | POA: Diagnosis not present

## 2024-05-08 DIAGNOSIS — Z992 Dependence on renal dialysis: Secondary | ICD-10-CM | POA: Insufficient documentation

## 2024-05-08 LAB — BASIC METABOLIC PANEL WITH GFR
Anion gap: 23 — ABNORMAL HIGH (ref 5–15)
Anion gap: 18 — ABNORMAL HIGH (ref 5–15)
BUN: 96 mg/dL — ABNORMAL HIGH (ref 8–23)
BUN: 44 mg/dL — ABNORMAL HIGH (ref 8–23)
CO2: 19 mmol/L — ABNORMAL LOW (ref 22–32)
CO2: 24 mmol/L (ref 22–32)
Calcium: 7.5 mg/dL — ABNORMAL LOW (ref 8.9–10.3)
Calcium: 8.2 mg/dL — ABNORMAL LOW (ref 8.9–10.3)
Chloride: 98 mmol/L (ref 98–111)
Chloride: 95 mmol/L — ABNORMAL LOW (ref 98–111)
Creatinine, Ser: 12.51 mg/dL — ABNORMAL HIGH (ref 0.44–1.00)
Creatinine, Ser: 7.4 mg/dL — ABNORMAL HIGH (ref 0.44–1.00)
GFR, Estimated: 3 mL/min — ABNORMAL LOW (ref >60)
GFR, Estimated: 6 mL/min — ABNORMAL LOW (ref >60)
Glucose, Bld: 99 mg/dL (ref 70–99)
Glucose, Bld: 159 mg/dL — ABNORMAL HIGH (ref 70–99)
Potassium: 7.1 mmol/L (ref 3.5–5.1)
Potassium: 4.4 mmol/L (ref 3.5–5.1)
Sodium: 140 mmol/L (ref 135–145)
Sodium: 137 mmol/L (ref 135–145)

## 2024-05-08 LAB — CBC
HCT: 28.8 % — ABNORMAL LOW (ref 36.0–46.0)
Hemoglobin: 8.7 g/dL — ABNORMAL LOW (ref 12.0–15.0)
MCH: 26.9 pg (ref 26.0–34.0)
MCHC: 30.2 g/dL (ref 30.0–36.0)
MCV: 88.9 fL (ref 80.0–100.0)
Platelets: 150 K/uL (ref 150–400)
RBC: 3.24 MIL/uL — ABNORMAL LOW (ref 3.87–5.11)
RDW: 16 % — ABNORMAL HIGH (ref 11.5–15.5)
WBC: 6.2 K/uL (ref 4.0–10.5)
nRBC: 0 % (ref 0.0–0.2)

## 2024-05-08 LAB — TROPONIN I (HIGH SENSITIVITY)
Troponin I (High Sensitivity): 21 ng/L — ABNORMAL HIGH (ref <18)
Troponin I (High Sensitivity): 23 ng/L — ABNORMAL HIGH (ref <18)
Troponin I (High Sensitivity): 20 ng/L — ABNORMAL HIGH (ref <18)

## 2024-05-08 LAB — I-STAT CG4 LACTIC ACID, ED
Lactic Acid, Venous: 0.7 mmol/L (ref 0.5–1.9)
Lactic Acid, Venous: 1.1 mmol/L (ref 0.5–1.9)

## 2024-05-08 LAB — HEPATITIS B SURFACE ANTIGEN: Hepatitis B Surface Ag: NONREACTIVE

## 2024-05-08 LAB — MAGNESIUM: Magnesium: 2.1 mg/dL (ref 1.7–2.4)

## 2024-05-08 MED ORDER — HEPARIN SODIUM (PORCINE) 1000 UNIT/ML DIALYSIS: 1000.0000 [IU] | INTRAMUSCULAR | Status: DC | PRN

## 2024-05-08 MED ORDER — NEPRO/CARBSTEADY PO LIQD: 237.0000 mL | ORAL | Status: DC | PRN

## 2024-05-08 MED ORDER — HYDROXYZINE HCL 25 MG PO TABS
25.0000 mg | ORAL_TABLET | Freq: Two times a day (BID) | ORAL | Status: DC
  Administered 2024-05-08 – 2024-05-12 (×9): 25 mg via ORAL
  Filled 2024-05-08 (×9): qty 1

## 2024-05-08 MED ORDER — ALBUTEROL SULFATE (2.5 MG/3ML) 0.083% IN NEBU
10.0000 mg | INHALATION_SOLUTION | Freq: Once | RESPIRATORY_TRACT | Status: AC
  Administered 2024-05-08: 10 mg via RESPIRATORY_TRACT
  Filled 2024-05-08: qty 12

## 2024-05-08 MED ORDER — ANTICOAGULANT SODIUM CITRATE 4% (200MG/5ML) IV SOLN: 5.0000 mL | Status: DC | PRN

## 2024-05-08 MED ORDER — SUCROFERRIC OXYHYDROXIDE 500 MG PO CHEW
500.0000 mg | CHEWABLE_TABLET | Freq: Every day | ORAL | Status: DC
  Administered 2024-05-08: 500 mg via ORAL
  Filled 2024-05-08: qty 1

## 2024-05-08 MED ORDER — LIDOCAINE-PRILOCAINE 2.5-2.5 % EX CREA: 1.0000 | TOPICAL_CREAM | CUTANEOUS | Status: DC | PRN

## 2024-05-08 MED ORDER — PENTAFLUOROPROP-TETRAFLUOROETH EX AERO: 1.0000 | INHALATION_SPRAY | CUTANEOUS | Status: DC | PRN

## 2024-05-08 MED ORDER — LIDOCAINE-PRILOCAINE 2.5-2.5 % EX CREA
1.0000 | TOPICAL_CREAM | CUTANEOUS | Status: DC | PRN
Start: 2024-05-08 — End: 2024-05-08

## 2024-05-08 MED ORDER — SODIUM CHLORIDE 0.9 % IV SOLN
500.0000 mg | INTRAVENOUS | Status: AC
  Administered 2024-05-08: 500 mg via INTRAVENOUS
  Filled 2024-05-08: qty 5

## 2024-05-08 MED ORDER — ACETAMINOPHEN 500 MG PO TABS
1000.0000 mg | ORAL_TABLET | Freq: Four times a day (QID) | ORAL | Status: DC
  Administered 2024-05-08 – 2024-05-11 (×9): 1000 mg via ORAL
  Filled 2024-05-08 (×12): qty 2

## 2024-05-08 MED ORDER — HEPARIN SODIUM (PORCINE) 5000 UNIT/ML IJ SOLN
5000.0000 [IU] | Freq: Three times a day (TID) | INTRAMUSCULAR | Status: DC
  Administered 2024-05-08 – 2024-05-12 (×11): 5000 [IU] via SUBCUTANEOUS
  Filled 2024-05-08 (×12): qty 1

## 2024-05-08 MED ORDER — ASPIRIN 81 MG PO TBEC
81.0000 mg | DELAYED_RELEASE_TABLET | Freq: Every day | ORAL | Status: DC
Start: 2024-05-08 — End: 2024-05-12
  Administered 2024-05-08 – 2024-05-12 (×5): 81 mg via ORAL
  Filled 2024-05-08 (×5): qty 1

## 2024-05-08 MED ORDER — CARVEDILOL 12.5 MG PO TABS
12.5000 mg | ORAL_TABLET | Freq: Two times a day (BID) | ORAL | Status: DC
  Administered 2024-05-08 – 2024-05-12 (×7): 12.5 mg via ORAL
  Filled 2024-05-08 (×7): qty 1

## 2024-05-08 MED ORDER — CHLORHEXIDINE GLUCONATE CLOTH 2 % EX PADS
6.0000 | MEDICATED_PAD | Freq: Every day | CUTANEOUS | Status: DC
  Administered 2024-05-08 – 2024-05-09 (×2): 6 via TOPICAL

## 2024-05-08 MED ORDER — TIZANIDINE HCL 4 MG PO TABS
4.0000 mg | ORAL_TABLET | Freq: Every day | ORAL | Status: DC | PRN
  Administered 2024-05-10 – 2024-05-11 (×2): 4 mg via ORAL
  Filled 2024-05-08 (×3): qty 1

## 2024-05-08 MED ORDER — SODIUM CHLORIDE 0.9 % IV SOLN
2.0000 g | INTRAVENOUS | Status: AC
  Administered 2024-05-08: 2 g via INTRAVENOUS
  Filled 2024-05-08: qty 20

## 2024-05-08 MED ORDER — ACETAMINOPHEN 325 MG PO TABS
650.0000 mg | ORAL_TABLET | Freq: Once | ORAL | Status: DC
  Filled 2024-05-08: qty 2

## 2024-05-08 MED ORDER — ATORVASTATIN CALCIUM 80 MG PO TABS
80.0000 mg | ORAL_TABLET | Freq: Every evening | ORAL | Status: DC
  Administered 2024-05-08 – 2024-05-11 (×4): 80 mg via ORAL
  Filled 2024-05-08 (×4): qty 1

## 2024-05-08 MED ORDER — HEPARIN SODIUM (PORCINE) 1000 UNIT/ML IJ SOLN
INTRAMUSCULAR | Status: AC
  Filled 2024-05-08: qty 5

## 2024-05-08 MED ORDER — DEXTROSE 50 % IV SOLN
1.0000 | Freq: Once | INTRAVENOUS | Status: AC
  Administered 2024-05-08: 50 mL via INTRAVENOUS
  Filled 2024-05-08: qty 50

## 2024-05-08 MED ORDER — ALTEPLASE 2 MG IJ SOLR: 2.0000 mg | Freq: Once | INTRAMUSCULAR | Status: DC | PRN

## 2024-05-08 MED ORDER — IPRATROPIUM-ALBUTEROL 0.5-2.5 (3) MG/3ML IN SOLN
3.0000 mL | Freq: Once | RESPIRATORY_TRACT | Status: AC
  Administered 2024-05-08: 3 mL via RESPIRATORY_TRACT
  Filled 2024-05-08: qty 3

## 2024-05-08 MED ORDER — LIDOCAINE HCL (PF) 1 % IJ SOLN: 5.0000 mL | INTRAMUSCULAR | Status: DC | PRN

## 2024-05-08 MED ORDER — PANTOPRAZOLE SODIUM 40 MG PO TBEC
40.0000 mg | DELAYED_RELEASE_TABLET | Freq: Every day | ORAL | Status: DC
  Administered 2024-05-09 – 2024-05-12 (×4): 40 mg via ORAL
  Filled 2024-05-08 (×5): qty 1

## 2024-05-08 MED ORDER — SODIUM BICARBONATE 8.4 % IV SOLN
50.0000 meq | Freq: Once | INTRAVENOUS | Status: AC
  Administered 2024-05-08: 50 meq via INTRAVENOUS
  Filled 2024-05-08: qty 50

## 2024-05-08 MED ORDER — HYDROMORPHONE HCL 1 MG/ML IJ SOLN
1.0000 mg | Freq: Once | INTRAMUSCULAR | Status: AC
  Administered 2024-05-08: 1 mg via INTRAVENOUS
  Filled 2024-05-08: qty 1

## 2024-05-08 MED ORDER — SODIUM ZIRCONIUM CYCLOSILICATE 10 G PO PACK
10.0000 g | PACK | Freq: Once | ORAL | Status: AC
  Administered 2024-05-08: 10 g via ORAL
  Filled 2024-05-08: qty 1

## 2024-05-08 MED ORDER — FLUTICASONE PROPIONATE 50 MCG/ACT NA SUSP: 2.0000 | Freq: Every day | NASAL | Status: DC | PRN

## 2024-05-08 MED ORDER — FLUTICASONE FUROATE-VILANTEROL 200-25 MCG/INH IN AEPB
1.0000 | INHALATION_SPRAY | Freq: Every day | RESPIRATORY_TRACT | Status: DC
  Administered 2024-05-09 – 2024-05-12 (×2): 1 via RESPIRATORY_TRACT
  Filled 2024-05-08 (×2): qty 1

## 2024-05-08 MED ORDER — OXYCODONE HCL 5 MG PO TABS
ORAL_TABLET | ORAL | Status: AC
  Filled 2024-05-08: qty 2

## 2024-05-08 MED ORDER — SUCROFERRIC OXYHYDROXIDE 500 MG PO CHEW
500.0000 mg | CHEWABLE_TABLET | Freq: Three times a day (TID) | ORAL | Status: DC
  Administered 2024-05-09 – 2024-05-12 (×11): 500 mg via ORAL
  Filled 2024-05-08 (×11): qty 1

## 2024-05-08 MED ORDER — HEPARIN SODIUM (PORCINE) 1000 UNIT/ML DIALYSIS: 2500.0000 [IU] | Freq: Once | INTRAMUSCULAR | Status: DC

## 2024-05-08 MED ORDER — CINACALCET HCL 30 MG PO TABS
30.0000 mg | ORAL_TABLET | Freq: Every day | ORAL | Status: DC
  Administered 2024-05-09 – 2024-05-12 (×4): 30 mg via ORAL
  Filled 2024-05-08 (×4): qty 1

## 2024-05-08 MED ORDER — INSULIN ASPART 100 UNIT/ML IV SOLN
10.0000 [IU] | Freq: Once | INTRAVENOUS | Status: AC
  Administered 2024-05-08: 10 [IU] via INTRAVENOUS

## 2024-05-08 MED ORDER — PREGABALIN 100 MG PO CAPS
100.0000 mg | ORAL_CAPSULE | Freq: Two times a day (BID) | ORAL | Status: DC
  Administered 2024-05-08 – 2024-05-12 (×9): 100 mg via ORAL
  Filled 2024-05-08 (×9): qty 1

## 2024-05-08 MED ORDER — GABAPENTIN 100 MG PO CAPS
200.0000 mg | ORAL_CAPSULE | ORAL | Status: DC
  Administered 2024-05-08 – 2024-05-11 (×3): 200 mg via ORAL
  Filled 2024-05-08 (×3): qty 2

## 2024-05-08 MED ORDER — CALCIUM GLUCONATE-NACL 1-0.675 GM/50ML-% IV SOLN
1.0000 g | Freq: Once | INTRAVENOUS | Status: AC
  Administered 2024-05-08: 1000 mg via INTRAVENOUS
  Filled 2024-05-08: qty 50

## 2024-05-08 MED ORDER — ALBUTEROL SULFATE (2.5 MG/3ML) 0.083% IN NEBU: 2.5000 mg | INHALATION_SOLUTION | RESPIRATORY_TRACT | Status: DC | PRN

## 2024-05-08 MED ORDER — CINACALCET HCL 30 MG PO TABS: 30.0000 mg | ORAL_TABLET | Freq: Every day | ORAL | Status: DC

## 2024-05-08 MED ORDER — OXYCODONE HCL 5 MG PO TABS
10.0000 mg | ORAL_TABLET | Freq: Three times a day (TID) | ORAL | Status: DC | PRN
  Administered 2024-05-08 – 2024-05-11 (×5): 10 mg via ORAL
  Filled 2024-05-08 (×5): qty 2

## 2024-05-08 MED ORDER — POLYETHYLENE GLYCOL 3350 17 GM/SCOOP PO POWD: 17.0000 g | Freq: Every day | ORAL | Status: DC | PRN

## 2024-05-08 MED ORDER — SENNA 8.6 MG PO TABS
1.0000 | ORAL_TABLET | Freq: Two times a day (BID) | ORAL | Status: DC
  Administered 2024-05-08 – 2024-05-11 (×4): 8.6 mg via ORAL
  Filled 2024-05-08 (×8): qty 1

## 2024-05-08 MED ORDER — AMLODIPINE BESYLATE 10 MG PO TABS
10.0000 mg | ORAL_TABLET | ORAL | Status: DC
  Administered 2024-05-08: 10 mg via ORAL
  Filled 2024-05-08: qty 1

## 2024-05-08 NOTE — Progress Notes (Signed)
  Received patient in bed to unit.   Informed consent signed and in chart.    TX duration:3.5     Transported by  Hand-off given to patient's nurse.    Access used: Left AVF Access issues: None   Total UF removed: 3000 Medication(s) given: Oxy 10 mg 1340 Post HD VS: 135/88       Hunter Hacking LPN [facility]

## 2024-05-08 NOTE — H&P (Signed)
 Date: 05/08/2024               Patient Name:  [name] MRN: [id number]  DOB: 02/20/1956 Age / Sex: 68 y.o., female   PCP: No primary care provider on file.         Medical Service: Internal Medicine Teaching Service         Attending Physician: Dr. Jone Dauphin      First Contact: Melvenia Morrison, MD}    Second Contact: Dr. Damien Lease, DO         Pager Information: First Contact Pager: [phone]   Second Contact Pager: [phone]   SUBJECTIVE   Chief Complaint: Shortness of breath  History of Present Illness: [name] is a 68 y.o. female with PMH of ESRD on HD TTS, moderate to severe AS, HFpEF, chronic pain due to knee OA, neuropathic pain, anemia of chronic disease, T2DM, HTN, HLD, asthma, obesity,who presented to the ED for difficulty breathing .   Per patient, was recently seen at [facility] on 10/23 for increased cough, sputum production. Diagnosed with pneumonia, she was given a 5-day course of Levaquin  to finish outpatient. She has felt improvement in her cough and initial symptoms since then, however, reports worsening dyspnea at rest and with ambulation. She is often able to sleep flat, but has not been able to do so since Friday.   Of note, patient reports that she missed her HD session for most of last week. Patient missed HD on 10/23 and 10/25. Initially, this had been because she felt sick but most importantly this was because her sister's husband suffered a traumatic accident and died on 2024-05-25. The burial was done on Saturday. She has not been able to keep up with her medications as she has been involved in the funeral services. However, today, she felt like she does when she misses dialysis: short of breath, swollen in her legs, and unable to lay flat.   In the ED, patient patient also endorsed increased stabbing pain to her bilateral hands and feet. Has not taken her lyrica  or gabapentin  today. Denies chest pain, nausea, vomiting, cough or sputum production, fevers or  chills.   Her labs significant for hyperkalemia to 7.1; no overt EKG changes, however, she was initiated on the hyperkalemia protocol and nephrology consulted for HD today.   Meds:  Patient reported:  Amlodipine  10 mg daily Lipitor  80 mg daily Carvedilol  12.5 mg BID Torsemide  100 mg on Non HD days Atarax  25 mg BID PRN Sesipar 30 mg daily Protonix  40 mg daily MiraLax  daily Senna  Gabapentin  200 mg only after HD Lyrica  100 mg BID  Tizanidine  4 mg PRN Oxycodone  10 mg TID PRN  Albuterol   Breo Ellipta  Lidocaine  patches  No outpatient medications have been marked as taking for the 05/08/24 encounter [facility] Encounter).    Past Medical History Past Medical History:  Diagnosis Date   Anemia    Aortic stenosis    Echo 8/18: mean 13, peak 28, LVOT/AV mean velocity 0.51   Arthritis    Asthma    As a child    Bronchitis    CAD (coronary artery disease)    a. 09/2016: 50% Ost 1st Mrg stenosis, 50% 2nd Mrg stenosis, 20% Mid-Cx, 95% Prox LAD, 40% mid-LAD, and 10% dist-LAD stenosis. Staged PCI with DES to Prox-LAD.    Chronic combined systolic and diastolic CHF (congestive heart failure) (HCC) 2011   echo 2/18: EF 55-60, normal wall motion, grade 2 diastolic dysfunction,  trivial AI // echo 3/18: Septal and apical HK, EF 45-50, normal wall motion, trivial AI, mild LAE, PASP 38 // echo 8/18: EF 60-65, normal wall motion, grade 1 diastolic dysfunction, calcified aortic valve leaflets, mild aortic stenosis (mean 13, peak 28, LVOT/AV mean velocity 0.51), mild AI, moderate MAC, mild LAE, trivial TR    Chronic kidney disease    STAGE 4   Chronic kidney disease on chronic dialysis (HCC)    t, th, sat   Complication of anesthesia    Depression    Diabetes mellitus Dx 1989   Elevated lipids    GERD (gastroesophageal reflux disease)    Gout    Heart murmur    asymptomatic   Hepatitis C Dx 2013   Hypertension Dx 1989   Infected surgical wound    Lt arm   Myocardial infarction (HCC)  07/2015   Obesity    Pancreatitis 2013   Pneumonia    Refusal of blood transfusions as patient is Jehovah's Witness    pt states she is not jehovas witness and does not refuse blood products   Tendinitis    Tremors of nervous system    LEFT HAND   Ulcer 2010     Past Surgical History Past Surgical History:  Procedure Laterality Date   A/V FISTULAGRAM Left 04/11/2019   Procedure: A/V FISTULAGRAM;  Surgeon: Jama Cordella MATSU, MD;  Location: [facility] INVASIVE CV LAB;  Service: Cardiovascular;  Laterality: Left;   A/V FISTULAGRAM Left 06/02/2019   Procedure: A/V FISTULAGRAM;  Surgeon: Jama Cordella MATSU, MD;  Location: [facility] IN[facility];  Service: Cardiovascular;  Laterality: Left;   AMPUTATION Left 10/28/2022   Procedure: LEFT FOOT 1ST [place] AMPUTATION;  Surgeon: Harden Jerona GAILS, MD;  Location: [facility] OR;  Service: Orthopedics;  Laterality: Left;   APPLICATION OF WOUND VAC Left 08/14/2017   Procedure: APPLICATION OF WOUND VAC Exchange;  Surgeon: Dessa Reyes ORN, MD;  Location: [facility] ORS;  Service: General;  Laterality: Left;   APPLICATION OF WOUND VAC Left 12/21/2018   Procedure: APPLICATION OF WOUND VAC;  Surgeon: Jama Cordella MATSU, MD;  Location: [facility] ORS;  Service: Vascular;  Laterality: Left;   AV FISTULA PLACEMENT Left 08/19/2018   Procedure: ARTERIOVENOUS (AV) FISTULA CREATION ( BRACHIOBASILIC );  Surgeon: Jama Cordella MATSU, MD;  Location: [facility] ORS;  Service: Vascular;  Laterality: Left;   BASCILIC VEIN TRANSPOSITION Left 11/18/2018   Procedure: BASCILIC VEIN TRANSPOSITION;  Surgeon: Jama Cordella MATSU, MD;  Location: [facility] ORS;  Service: Vascular;  Laterality: Left;   BIOPSY  09/20/2020   Procedure: BIOPSY;  Surgeon: Rollin Dover, MD;  Location: [facility] ENDOSCOPY;  Service: Endoscopy;;   CHOLECYSTECTOMY     COLONOSCOPY WITH PROPOFOL  N/A 02/03/2018   Procedure: COLONOSCOPY WITH PROPOFOL ;  Surgeon: Unk Corinn Skiff, MD;  Location: [facility] ENDOSCOPY;  Service: Gastroenterology;  Laterality: N/A;    COLONOSCOPY WITH PROPOFOL  N/A 07/25/2022   Procedure: COLONOSCOPY WITH PROPOFOL ;  Surgeon: Maryruth Ole DASEN, MD;  Location: [facility] ENDOSCOPY;  Service: Endoscopy;  Laterality: N/A;   CORONARY ANGIOPLASTY  07/2015   STENT   CORONARY STENT INTERVENTION N/A 09/18/2016   Procedure: Coronary Stent Intervention;  Surgeon: Debby DELENA Sor, MD;  Location: [facility];  Service: Cardiovascular;  Laterality: N/A;   DIALYSIS/PERMA CATHETER INSERTION N/A 05/10/2018   Procedure: DIALYSIS/PERMA CATHETER INSERTION;  Surgeon: Jama Cordella MATSU, MD;  Location: [facility];  Service: Cardiovascular;  Laterality: N/A;   DIALYSIS/PERMA CATHETER INSERTION N/A 01/21/2024   Procedure: DIALYSIS/PERMA  CATHETER INSERTION;  Surgeon: Lanis Fonda BRAVO, MD;  Location: [facility];  Service: Cardiovascular;  Laterality: N/A;   DRESSING CHANGE UNDER ANESTHESIA Left 08/15/2017   Procedure: exploration of wound for bleeding;  Surgeon: Dessa Reyes ORN, MD;  Location: [facility] ORS;  Service: General;  Laterality: Left;   ESOPHAGOGASTRODUODENOSCOPY (EGD) WITH PROPOFOL  N/A 02/03/2018   Procedure: ESOPHAGOGASTRODUODENOSCOPY (EGD) WITH PROPOFOL ;  Surgeon: Unk Corinn Skiff, MD;  Location: [facility] ENDOSCOPY;  Service: Gastroenterology;  Laterality: N/A;   ESOPHAGOGASTRODUODENOSCOPY (EGD) WITH PROPOFOL  N/A 09/20/2020   Procedure: ESOPHAGOGASTRODUODENOSCOPY (EGD) WITH PROPOFOL ;  Surgeon: Rollin Dover, MD;  Location: [facility] ENDOSCOPY;  Service: Endoscopy;  Laterality: N/A;   ESOPHAGOGASTRODUODENOSCOPY (EGD) WITH PROPOFOL  N/A 07/25/2022   Procedure: ESOPHAGOGASTRODUODENOSCOPY (EGD) WITH PROPOFOL ;  Surgeon: Maryruth Ole DASEN, MD;  Location: [facility] ENDOSCOPY;  Service: Endoscopy;  Laterality: N/A;   EYE SURGERY  11/17/2018   INCISION AND DRAINAGE ABSCESS Left 08/12/2017   Procedure: INCISION AND DRAINAGE ABSCESS;  Surgeon: Dessa Reyes ORN, MD;  Location: [facility] ORS;  Service: General;  Laterality: Left;   KNEE ARTHROSCOPY     LEFT HEART  CATH N/A 09/18/2016   Procedure: Left Heart Cath;  Surgeon: Debby DELENA Sor, MD;  Location: [facility];  Service: Cardiovascular;  Laterality: N/A;   LEFT HEART CATH AND CORONARY ANGIOGRAPHY N/A 09/16/2016   Procedure: Left Heart Cath and Coronary Angiography;  Surgeon: Lonni JONETTA Cash, MD;  Location: [facility] INVASIVE CV LAB;  Service: Cardiovascular;  Laterality: N/A;   LEFT HEART CATH AND CORONARY ANGIOGRAPHY N/A 04/29/2017   Procedure: LEFT HEART CATH AND CORONARY ANGIOGRAPHY;  Surgeon: Mady Lonni, MD;  Location: [facility];  Service: Cardiovascular;  Laterality: N/A;   LOWER EXTREMITY ANGIOGRAPHY Right 03/08/2018   Procedure: LOWER EXTREMITY ANGIOGRAPHY;  Surgeon: Jama Cordella MATSU, MD;  Location: [facility];  Service: Cardiovascular;  Laterality: Right;   PERIPHERAL VASCULAR THROMBECTOMY  01/21/2024   Procedure: PERIPHERAL VASCULAR THROMBECTOMY;  Surgeon: Lanis Fonda BRAVO, MD;  Location: [facility];  Service: Cardiovascular;;   TUBAL LIGATION     TUBAL LIGATION     UPPER EXTREMITY ANGIOGRAPHY Right 09/19/2019   Procedure: UPPER EXTREMITY ANGIOGRAPHY;  Surgeon: Jama Cordella MATSU, MD;  Location: [facility];  Service: Cardiovascular;  Laterality: Right;   VENOUS STENT  01/21/2024   Procedure: VENOUS STENT;  Surgeon: Lanis Fonda BRAVO, MD;  Location: [facility];  Service: Cardiovascular;;   WOUND DEBRIDEMENT Left 12/21/2018   Procedure: DEBRIDEMENT WOUND;  Surgeon: Jama Cordella MATSU, MD;  Location: [facility] ORS;  Service: Vascular;  Laterality: Left;   WOUND DEBRIDEMENT Left 12/30/2018   Procedure: DEBRIDEMENT WOUND WITH VAC PLACEMENT (LEFT UPPER EXTREMITY);  Surgeon: Jama Cordella MATSU, MD;  Location: [facility] OR[facility];  Service: Vascular;  Laterality: Left;     Social:  Lives With: sister in [place] Occupation: Retired Support: sister and daughter Level of Function:  independent with ADL, iADLS. No mobility aids PCP:  Substances: -Tobacco: No -Alcohol:  No -Recreational Drug: No  Family History:  Family History  Problem Relation Age of Onset   Colon cancer Mother    Heart attack Other    Heart attack Maternal Grandmother    Hypertension Sister    Hypertension Brother    Diabetes Paternal Grandmother    Breast cancer Neg Hx      Allergies: Allergies as of 05/08/2024 - Review Complete 05/08/2024  Allergen Reaction Noted   Shellfish allergy Anaphylaxis and Swelling 05/14/2012   Diazepam  Other (See  Comments) 10/07/2015   Morphine  Itching and Other (See Comments) 05/14/2012    Review of Systems: A complete ROS was negative except as per HPI.   OBJECTIVE:   Physical Exam: Blood pressure (!) 167/66, pulse 73, temperature 97.8 F (36.6 C), temperature source Oral, resp. rate 11, height 5' 5 (1.651 m), weight 104.3 kg, SpO2 96%.  Constitutional: well-appearing woman sitting in bed, in no acute distress HENT: normocephalic atraumatic, mucous membranes moist Eyes: conjunctiva non-erythematous Neck: supple, no over JVP Cardiovascular: regular rate and rhythm, systolic ejection murmur present Pulmonary/Chest: Mildy increase work of breathing on room air, lungs clear to auscultation bilaterally. NO wheezing Abdominal: soft, non-tender, non-distended MSK: normal bulk and tone. Pain with palpation of distal fingers and lower extremities. 1+ pitting edema throughout Neurological: alert & oriented x 3, 5/5 strength in bilateral upper and lower extremities, normal gait Skin: warm and dry Psych: Sad mood and affect  Labs: CBC    Component Value Date/Time   WBC 6.2 05/08/2024 0759   RBC 3.24 (L) 05/08/2024 0759   HGB 8.7 (L) 05/08/2024 0759   HGB 9.7 (L) 09/27/2020 1417   HCT 28.8 (L) 05/08/2024 0759   HCT 28.9 (L) 09/27/2020 1417   PLT 150 05/08/2024 0759   PLT 209 09/27/2020 1417   MCV 88.9 05/08/2024 0759   MCV 89 09/27/2020 1417   MCH 26.9 05/08/2024 0759   MCHC 30.2 05/08/2024 0759   RDW 16.0 (H) 05/08/2024 0759   RDW  14.5 09/27/2020 1417   LYMPHSABS 1.7 03/27/2024 1912   LYMPHSABS 1.5 09/27/2020 1417   MONOABS 0.5 03/27/2024 1912   EOSABS 0.4 03/27/2024 1912   EOSABS 0.2 09/27/2020 1417   BASOSABS 0.0 03/27/2024 1912   BASOSABS 0.0 09/27/2020 1417     CMP     Component Value Date/Time   NA 140 05/08/2024 0704   NA 139 09/27/2020 1417   K 7.1 (HH) 05/08/2024 0704   CL 98 05/08/2024 0704   CO2 19 (L) 05/08/2024 0704   GLUCOSE 99 05/08/2024 0704   BUN 96 (H) 05/08/2024 0704   BUN 9 09/27/2020 1417   CREATININE 12.51 (H) 05/08/2024 0704   CREATININE 1.36 (H) 09/23/2016 1451   CALCIUM  7.5 (L) 05/08/2024 0704   PROT 6.9 03/27/2024 1912   PROT 6.8 09/27/2020 1417   ALBUMIN  3.3 (L) 03/30/2024 0515   ALBUMIN  4.0 09/27/2020 1417   AST <10 (L) 03/27/2024 1912   ALT 24 03/27/2024 1912   ALKPHOS 198 (H) 03/27/2024 1912   BILITOT 0.5 03/27/2024 1912   BILITOT 0.4 09/27/2020 1417   GFRNONAA 3 (L) 05/08/2024 0704   GFRNONAA 43 (L) 09/23/2016 1451   GFRAA 6 (L) 01/31/2020 1858   GFRAA 49 (L) 09/23/2016 1451    Imaging:  DG Chest Portable 1 View Result Date: 05/08/2024 EXAM: 1 VIEW XRAY OF THE CHEST 05/08/2024 07:08:49 AM COMPARISON: 05/04/2024 CLINICAL HISTORY: SOB, recent pneumonia, fever; rover. FINDINGS: LUNGS AND PLEURA: Scarring or atelectasis at the right lung base, without significant change. No pulmonary edema. No pleural effusion. No pneumothorax. HEART AND MEDIASTINUM: No acute abnormality of the cardiac and mediastinal silhouettes. BONES AND SOFT TISSUES: No acute osseous abnormality. IMPRESSION: 1. Stable scarring or atelectasis at the right lung base. Electronically signed by: Norleen Kil MD 05/08/2024 07:35 AM EDT RP Workstation: [id number]     EKG: Admission personally reviewed my interpretation is sinus rhythm, no PR, QRS prolongation. Mild eaked T waves. Mild Qtc prolongation at 495. Prior EKG 03/03/2024. Repeat EKG this AM  10:41 AM with improvement in peaked T waves. Qtc  496.  ASSESSMENT & PLAN:   Assessment & Plan by Problem: Principal Problem:   Hyperkalemia Active Problems:   ESRD on hemodialysis (HCC)   Volume overload  Hyperkalemia Patient with ESRD on HD missing sessions for family tragedy.  S/p hyperkalemia protocol. No EKG changes noted on repeat EKG. Currently in HD - Appreciate nephrology's assistance with HD - Repeat K post HD - Cardiac telemetry  Respiratory distress Volume overload Missed HD sessions COPD No hypoxia, wheezing, or crackles on my examination. Given missed HD sessions, suspect volume overload driving increase work of breathing.  - Will re-evaluate post dialysis.  - Continued Breo Ellipta  and Duonebs as needed - SPO2 89 - 93%  ESRD on HD TTS Metabolic bone disease Still makes urine.  - HD while inpatient - HD navigator aware - Strict I/O - Avoid nephrotoxic drugs - Restarted on PTA meds  Pneumonia  Diagnosed prior to admission. She is currently on last day of Levaquin . QTC is mildy prolonged and she is now hospitalized. Will give her one dose of CTX and Azithromycin . - SPO2 > 92% - Finish last day of Levaquin    - Switch to CTX and Azithromycin  x 1 - Continue Duonebs as needed  Troponin elevation Suspect demand ischemia in setting of current stressors and volume overload.  - Trend EKG and troponin until downtrend   CAD HFpEF Moderate to severe AS Last TTE in 02/2024 with preserved EF and moderate to severe AS.  - Restarted on Carvedilol  BID and amlodipine  - On ASA and Lipitor  80 mg daily - Holding Torsemide   Anemia of ESRD Normocytic anemia Hgb 8.7 on admission. Improved from ~ 1 month ago. Last ESA administered 04/11/2024. lAst IV iron  infusion 01/2024. - Labs with outpatient HD - If prolonged hospitalization, would consider repeating iron  labs and IV repletion before discharge if persistent respiratory distress - IV iron  infusion per nephrology - Goal Hgb >7; transfuse as needed  Chronic pain  management Severe OA of bilateral knees - Oxycodone  10 mg TID PRN (PTA) - Lyrica   - Holding Gabapentin   Emotional distress Anxiety Recent traumatic loss of brother-in law. Patient requests prayers - Consult to spiritual team - Atarax  PRN  GERD - Protonix  daily   Best practice: Diet: Renal VTE: Heparin  Code: Full  Disposition planning: Prior to Admission Living Arrangement: Home, living with sister Anticipated Discharge Location: pending PT/OT assessment  Dispo: Admit patient to Observation with expected length of stay less than 2 midnights.  Signed: Elnora Ip, MD Internal Medicine Resident  05/08/2024, 1:43 PM  On Call pager: [phone]

## 2024-05-08 NOTE — Plan of Care (Signed)
   Problem: Health Behavior/Discharge Planning: Goal: Ability to manage health-related needs will improve Outcome: Progressing

## 2024-05-08 NOTE — Consult Note (Addendum)
 Renal Service Consult Note [facility] [name], MD  Patient: [name] Date: 05/08/2024 Requesting Physician: Dr. Shawn  Reason for Consult: ESRD pt w/ gen'd weakness, SOB HPI: The patient is a 69 y.o. year-old w/ PMH as below who presented to ED this morning c/o gen weakness and SOB w/ chest pain. H/o dementia at baseline per family. Pt stated missing HD last Friday, last HD was Tuesday/ Wed last week. Also her brother-in-law died last week at work where he does work on heavy trucks. In ED BP  149/59, HR 878, RR 16, temp 98.  K+ 7.1, creat 12.5, bun 96.  Hb 8.7.  WBC 6K.  LA 1.1. CXR was negative. Pt was admitted and was given IV abx for possible PNA. We were asked to see for dialysis.    Pt seen in HD unit. Feeling better already on HD. No current c/o's.    ROS - denies CP, no joint pain, no HA, no blurry vision, no rash, no diarrhea, no nausea/ vomiting   Past Medical History  Past Medical History:  Diagnosis Date   Anemia    Aortic stenosis    Echo 8/18: mean 13, peak 28, LVOT/AV mean velocity 0.51   Arthritis    Asthma    As a child    Bronchitis    CAD (coronary artery disease)    a. 09/2016: 50% Ost 1st Mrg stenosis, 50% 2nd Mrg stenosis, 20% Mid-Cx, 95% Prox LAD, 40% mid-LAD, and 10% dist-LAD stenosis. Staged PCI with DES to Prox-LAD.    Chronic combined systolic and diastolic CHF (congestive heart failure) (HCC) 2011   echo 2/18: EF 55-60, normal wall motion, grade 2 diastolic dysfunction, trivial AI // echo 3/18: Septal and apical HK, EF 45-50, normal wall motion, trivial AI, mild LAE, PASP 38 // echo 8/18: EF 60-65, normal wall motion, grade 1 diastolic dysfunction, calcified aortic valve leaflets, mild aortic stenosis (mean 13, peak 28, LVOT/AV mean velocity 0.51), mild AI, moderate MAC, mild LAE, trivial TR    Chronic kidney disease    STAGE 4   Chronic kidney disease on chronic dialysis (HCC)    t, th, sat   Complication of anesthesia     Depression    Diabetes mellitus Dx 1989   Elevated lipids    GERD (gastroesophageal reflux disease)    Gout    Heart murmur    asymptomatic   Hepatitis C Dx 2013   Hypertension Dx 1989   Infected surgical wound    Lt arm   Myocardial infarction (HCC) 07/2015   Obesity    Pancreatitis 2013   Pneumonia    Refusal of blood transfusions as patient is Jehovah's Witness    pt states she is not jehovas witness and does not refuse blood products   Tendinitis    Tremors of nervous system    LEFT HAND   Ulcer 2010   Past Surgical History  Past Surgical History:  Procedure Laterality Date   A/V FISTULAGRAM Left 04/11/2019   Procedure: A/V FISTULAGRAM;  Surgeon: Jama Cordella MATSU, MD;  Location: [facility]ASIVE CV [facility];  Service: Cardiovascular;  Laterality: Left;   A/V FISTULAGRAM Left 06/02/2019   Procedure: A/V FISTULAGRAM;  Surgeon: Jama Cordella MATSU, MD;  Location: [facility];  Service: Cardiovascular;  Laterality: Left;   AMPUTATION Left 10/28/2022   Procedure: LEFT FOOT 1ST Kane AMPUTATION;  Surgeon: Harden Jerona GAILS, MD;  Location: [facility] OR;  Service: Orthopedics;  Laterality: Left;  APPLICATION OF WOUND VAC Left 08/14/2017   Procedure: APPLICATION OF WOUND VAC Exchange;  Surgeon: Dessa Reyes ORN, MD;  Location: [facility] ORS;  Service: General;  Laterality: Left;   APPLICATION OF WOUND VAC Left 12/21/2018   Procedure: APPLICATION OF WOUND VAC;  Surgeon: Jama Cordella MATSU, MD;  Location: [facility] ORS;  Service: Vascular;  Laterality: Left;   AV FISTULA PLACEMENT Left 08/19/2018   Procedure: ARTERIOVENOUS (AV) FISTULA CREATION ( BRACHIOBASILIC );  Surgeon: Jama Cordella MATSU, MD;  Location: [facility] ORS;  Service: Vascular;  Laterality: Left;   BASCILIC VEIN TRANSPOSITION Left 11/18/2018   Procedure: BASCILIC VEIN TRANSPOSITION;  Surgeon: Jama Cordella MATSU, MD;  Location: [facility] ORS;  Service: Vascular;  Laterality: Left;   BIOPSY  09/20/2020   Procedure: BIOPSY;  Surgeon: Rollin Dover, MD;   Location: [facility] ENDOSCOPY;  Service: Endoscopy;;   CHOLECYSTECTOMY     COLONOSCOPY WITH PROPOFOL  N/A 02/03/2018   Procedure: COLONOSCOPY WITH PROPOFOL ;  Surgeon: Unk Corinn Skiff, MD;  Location: [facility] ENDOSCOPY;  Service: Gastroenterology;  Laterality: N/A;   COLONOSCOPY WITH PROPOFOL  N/A 07/25/2022   Procedure: COLONOSCOPY WITH PROPOFOL ;  Surgeon: Maryruth Ole DASEN, MD;  Location: [facility] ENDOSCOPY;  Service: Endoscopy;  Laterality: N/A;   CORONARY ANGIOPLASTY  07/2015   STENT   CORONARY STENT INTERVENTION N/A 09/18/2016   Procedure: Coronary Stent Intervention;  Surgeon: Debby DELENA Sor, MD;  Location: [facility];  Service: Cardiovascular;  Laterality: N/A;   DIALYSIS/PERMA CATHETER INSERTION N/A 05/10/2018   Procedure: DIALYSIS/PERMA CATHETER INSERTION;  Surgeon: Jama Cordella MATSU, MD;  Location: [facility];  Service: Cardiovascular;  Laterality: N/A;   DIALYSIS/PERMA CATHETER INSERTION N/A 01/21/2024   Procedure: DIALYSIS/PERMA CATHETER INSERTION;  Surgeon: Lanis Fonda BRAVO, MD;  Location: [facility];  Service: Cardiovascular;  Laterality: N/A;   DRESSING CHANGE UNDER ANESTHESIA Left 08/15/2017   Procedure: exploration of wound for bleeding;  Surgeon: Dessa Reyes ORN, MD;  Location: [facility] ORS;  Service: General;  Laterality: Left;   ESOPHAGOGASTRODUODENOSCOPY (EGD) WITH PROPOFOL  N/A 02/03/2018   Procedure: ESOPHAGOGASTRODUODENOSCOPY (EGD) WITH PROPOFOL ;  Surgeon: Unk Corinn Skiff, MD;  Location: [facility] ENDOSCOPY;  Service: Gastroenterology;  Laterality: N/A;   ESOPHAGOGASTRODUODENOSCOPY (EGD) WITH PROPOFOL  N/A 09/20/2020   Procedure: ESOPHAGOGASTRODUODENOSCOPY (EGD) WITH PROPOFOL ;  Surgeon: Rollin Dover, MD;  Location: [facility] ENDOSCOPY;  Service: Endoscopy;  Laterality: N/A;   ESOPHAGOGASTRODUODENOSCOPY (EGD) WITH PROPOFOL  N/A 07/25/2022   Procedure: ESOPHAGOGASTRODUODENOSCOPY (EGD) WITH PROPOFOL ;  Surgeon: Maryruth Ole DASEN, MD;  Location: [facility] ENDOSCOPY;  Service: Endoscopy;   Laterality: N/A;   EYE SURGERY  11/17/2018   INCISION AND DRAINAGE ABSCESS Left 08/12/2017   Procedure: INCISION AND DRAINAGE ABSCESS;  Surgeon: Dessa Reyes ORN, MD;  Location: [facility] ORS;  Service: General;  Laterality: Left;   KNEE ARTHROSCOPY     LEFT HEART CATH N/A 09/18/2016   Procedure: Left Heart Cath;  Surgeon: Debby DELENA Sor, MD;  Location: [facility];  Service: Cardiovascular;  Laterality: N/A;   LEFT HEART CATH AND CORONARY ANGIOGRAPHY N/A 09/16/2016   Procedure: Left Heart Cath and Coronary Angiography;  Surgeon: Lonni JONETTA Cash, MD;  Location[facility] INVASIVE CV LAB;  Service: Cardiovascular;  Laterality: N/A;   LEFT HEART CATH AND CORONARY ANGIOGRAPHY N/A 04/29/2017   Procedure: LEFT HEART CATH AND CORONARY ANGIOGRAPHY;  Surgeon: Mady Lonni, MD;  Location: [facility];  Service: Cardiovascular;  Laterality: N/A;   LOWER EXTREMITY ANGIOGRAPHY Right 03/08/2018   Procedure: LOWER EXTREMITY ANGIOGRAPHY;  Surgeon: Jama Cordella MATSU, MD;  Location: [facility];  Service: Cardiovascular;  Laterality: Right;   PERIPHERAL VASCULAR THROMBECTOMY  01/21/2024   Procedure: PERIPHERAL VASCULAR THROMBECTOMY;  Surgeon: Lanis Fonda BRAVO, MD;  Location: [facility];  Service: Cardiovascular;;   TUBAL LIGATION     TUBAL LIGATION     UPPER EXTREMITY ANGIOGRAPHY Right 09/19/2019   Procedure: UPPER EXTREMITY ANGIOGRAPHY;  Surgeon: Jama Cordella MATSU, MD;  Location: [facility];  Service: Cardiovascular;  Laterality: Right;   VENOUS STENT  01/21/2024   Procedure: VENOUS STENT;  Surgeon: Lanis Fonda BRAVO, MD;  Location: [facility];  Service: Cardiovascular;;   WOUND DEBRIDEMENT Left 12/21/2018   Procedure: DEBRIDEMENT WOUND;  Surgeon: Jama Cordella MATSU, MD;  Location: [facility] ORS;  Service: Vascular;  Laterality: Left;   WOUND DEBRIDEMENT Left 12/30/2018   Procedure: DEBRIDEMENT WOUND WITH VAC PLACEMENT (LEFT UPPER EXTREMITY);  Surgeon: Jama Cordella MATSU, MD;  Location:  [facility] ORS;  Service: Vascular;  Laterality: Left;   Family History  Family History  Problem Relation Age of Onset   Colon cancer Mother    Heart attack Other    Heart attack Maternal Grandmother    Hypertension Sister    Hypertension Brother    Diabetes Paternal Grandmother    Breast cancer Neg Hx    Social History  reports that she quit smoking about 43 years ago. Her smoking use included cigarettes. She started smoking about 49 years ago. She has a 1.5 pack-year smoking history. She has never used smokeless tobacco. She reports that she does not currently use alcohol. She reports that she does not currently use drugs after having used the following drugs: Marijuana. Allergies  Allergies  Allergen Reactions   Shellfish Allergy Anaphylaxis and Swelling   Diazepam  Other (See Comments)    felt like out of body experience   Morphine  Itching and Other (See Comments)    Tolerated hydromorphone  on 07/2020 *will take along with Benadryl *   Home medications Prior to Admission medications   Medication Sig Start Date End Date Taking? Authorizing Provider  albuterol  (VENTOLIN  HFA) 108 (90 Base) MCG/ACT inhaler Inhale 2 puffs into the lungs every 4 (four) hours as needed for wheezing or shortness of breath. 06/28/20   Vivienne Delon HERO, PA-C  amLODipine  (NORVASC ) 10 MG tablet Take 10 mg by mouth See admin instructions. Take one tablet by mouth on Monday, Wednesdays, Fridays, and Sundays only    [provider]  ammonium lactate (LAC-HYDRIN) 12 % lotion Apply 1 Application topically daily as needed for dry skin. 12/15/23   [provider]  aspirin  81 MG EC tablet Take 1 tablet (81 mg total) by mouth daily. 09/20/20   Singh, Prashant K, MD  atorvastatin  (LIPITOR ) 80 MG tablet Take 1 tablet (80 mg total) by mouth every evening. Patient taking differently: Take 80 mg by mouth daily. 04/24/21   Bacigalupo, Angela M, MD  carvedilol  (COREG ) 12.5 MG tablet Take 1 tablet (12.5 mg total)  by mouth 2 (two) times daily with a meal. 09/19/21   Paige, Victoria J, DO  cinacalcet  (SENSIPAR ) 30 MG tablet Take 30 mg by mouth daily. 04/11/24   [provider]  fluticasone  (FLONASE ) 50 MCG/ACT nasal spray Place 2 sprays into both nostrils daily as needed for allergies or rhinitis. 09/28/17   Vivienne Delon HERO, PA-C  fluticasone  furoate-vilanterol (BREO ELLIPTA ) 200-25 MCG/INH AEPB Inhale 1 puff into the lungs daily. 08/30/20   Vivienne Delon HERO, PA-C  gabapentin  (NEURONTIN ) 100 MG capsule Please take  2 capsules (200 mg) after dialysis on dialysis days only Tuesday, Thursday, Saturday. 03/09/24   Everhart, Kirstie, DO  HYDROcodone  bit-homatropine (HYCODAN) 5-1.5 MG/5ML syrup Take 5 mLs by mouth every 6 (six) hours as needed for cough. 05/04/24   Rolinda Rogue, MD  hydrOXYzine  (ATARAX ) 25 MG tablet Take 1 tablet (25 mg total) by mouth 2 (two) times daily. 11/17/22   Hongalgi, Anand D, MD  levofloxacin  (LEVAQUIN ) 500 MG tablet Take 1 tablet (500 mg total) by mouth daily for 5 days. 05/04/24 05/09/24  Rolinda Rogue, MD  lidocaine  (LIDODERM ) 5 % Place 1 patch onto the skin daily. Remove & Discard patch within 12 hours or as directed by MD Patient taking differently: Place 1 patch onto the skin daily as needed (for pain). Remove & Discard patch within 12 hours or as directed by MD 06/25/23   Nettie, April, MD  naloxone  (NARCAN ) nasal spray 4 mg/0.1 mL Place 1 spray into the nose daily as needed (for overdose). 04/08/22   [provider]  nitroGLYCERIN  (NITROSTAT ) 0.4 MG SL tablet Place 1 tablet (0.4 mg total) under the tongue every 5 (five) minutes x 3 doses as needed for chest pain. 03/08/24   Howell Lunger, DO  Oxycodone  HCl 10 MG TABS Take 1 tablet (10 mg total) by mouth 4 (four) times daily. 03/30/24   Gomes, Adriana, DO  OZEMPIC, 2 MG/DOSE, 8 MG/3ML SOPN Inject 2 mg into the skin once a week.    [provider]  pantoprazole  (PROTONIX ) 40 MG tablet Take 40 mg by mouth  daily before breakfast.    [provider]  polyethylene glycol powder (GLYCOLAX /MIRALAX ) 17 GM/SCOOP powder Dissolve 1 capful (17g) in 4-8 ounces of liquid and take by mouth daily as needed for mild constipation 03/30/24   Gomes, Adriana, DO  pregabalin  (LYRICA ) 100 MG capsule Take 100 mg by mouth 2 (two) times daily. 05/02/24   [provider]  senna (SENOKOT) 8.6 MG TABS tablet Take 1 tablet (8.6 mg total) by mouth 2 (two) times daily. 03/30/24   Gomes, Adriana, DO  sevelamer  carbonate (RENVELA ) 800 MG tablet Take 1,600 mg by mouth 3 (three) times daily with meals. 12/31/23   [provider]  tiZANidine  (ZANAFLEX ) 4 MG tablet Take 1 tablet (4 mg total) by mouth daily as needed for muscle spasms. 03/08/24   Everhart, Kirstie, DO  torsemide  (DEMADEX ) 100 MG tablet Take 1 tablet (100mg ) on NON-dialysis days (Sunday, Monday, Wednesday, Friday) 03/08/24   Everhart, Kirstie, DO  valACYclovir  (VALTREX ) 500 MG tablet TAKE (1) TABLET BY MOUTH EVERY OTHER DAY. Patient taking differently: Take 500 mg by mouth daily. 01/24/21   Bacigalupo, Angela M, MD  VELPHORO  500 MG chewable tablet Chew 500 mg by mouth 5 (five) times daily. 05/04/24   [provider]     Vitals:   05/08/24 9348 05/08/24 0703 05/08/24 0705 05/08/24 0856  BP:   (!) 149/59   Pulse:   78   Resp:   (!) 22   Temp:  98.7 F (37.1 C)    TempSrc:  Oral    SpO2:   98% 96%  Weight: 104.3 kg     Height: 5' 5 (1.651 m)      Exam Gen alert, no distress, no O2 Sclera anicteric, throat clear  No jvd or bruits Chest clear bilat to bases RRR no MRG Abd soft ntnd no mass or ascites +bs Ext no LE or UE edema, no other edema Neuro is alert, Ox 3 ,  nf    AVF+bruit    OP HD: TTS G-O  4.5h  B450   102kg  2K bath  AVF    Heparin  3000 Skips HD somewhat frequently Usually comes off 1-3kg over Last Op HD 10/21, post wt 105kg   Assessment/ Plan: Hyperkalemia: severe w/ K+ 7.1, no severe EKG changes. Plan iHD  today w/ low K+ bath for 90 min. Follow up labs in am.  ESRD: on HD TTS. HD today as above. Will do short HD tomorrow if possible.  HTN: on norvasc  and coreg  at home, resume prn per pmd.  Volume: not grossly overloaded, get's f/u wts after hd Anemia of esrd: Hb 8- 9, follow, transfuse prn .        Myer Fret  MD [facility] 05/08/2024, 10:39 AM  Recent Labs  Lab 05/08/24 0704 05/08/24 0759  HGB  --  8.7*  CALCIUM  7.5*  --   CREATININE 12.51*  --   K 7.1*  --    Inpatient medications:  acetaminophen   650 mg Oral Once   albuterol   10 mg Nebulization Once    HYDROmorphone  (DILAUDID ) injection  1 mg Intravenous Once    calcium  gluconate 1,000 mg (05/08/24 1037)

## 2024-05-08 NOTE — ED Triage Notes (Signed)
 Pot BIB EMS. Recently seen and discharged with pneumonia. Has been taking antibiotics with green yellow sputum. 80s% Room air, 100% NRB. EMS reports weakness and pt was unable to get up from couch independently. Pt woke up with sharp chest pain that went away then [facility] began. Family reports hx of dementia and confusion baseline.   EMS VS 160/80. HR 80, 140 cbg, 97.1 T

## 2024-05-08 NOTE — Hospital Course (Addendum)
 Respiratory distress (Improved) Volume overload Asthma Presented with respiratory distress. Chest x-Ferrin showed stable scarring or atelectasis at the right lung base. Given missed HD sessions, suspect volume overload driving increase work of breathing. She had spirometry in 2019 with showed moderate to sever restriction with no evidence of obstruction, which makes COPD less likely, although she may have had well-controlled asthma at this time. Given her desaturations with ambulation and intermittent wheezing we will also treat for potential asthma exacerbation. She is also still 3 kgs above her dry weight. Suspect that some of her respiratory distress is due to deconditioning due to her limited mobility from her chronic pain. Her symptoms have improved since admission and she was able to ambulate without desaturation yesterday. - Continue Breo Ellipta  - Duonebs 2.5mg  q6hr scheduled -Prednisone  40mg  daily for 5 days (last day 11/1) - Flonase  PRN - SPO2 89 - 93% - PT have seen the patient and recommend SNF.   ESRD on HD TTS Metabolic bone disease Still makes urine. 3kgs above dry weight after dialysis. RFP improved after dialysis. - HD while inpatient, planning for today. - HD navigator aware - Strict I/O - Avoid nephrotoxic drugs - Restarted on PTA meds -Velphoro  500mg  TID    Hyperkalemia (Resolved) Patient with ESRD on HD missing sessions for family tragedy.  S/p hyperkalemia protocol. No EKG changes noted on EKG. K 5.2 this morning. - Appreciate nephrology's assistance with HD, will plan for dialysis today - 10g Lokelma  this morning. - Daily RFP - Cardiac telemetry   Pneumonia (Resolved) Diagnosed prior to admission. Finished a 5 day course of CAP treatment with 4 days of Levaquin  and one day of Ceftriaxone  and Azithromycin  given mildly prolonged QTC and hyperkalemia. - Continue Duonebs as above   Troponin elevation (Resolved) Suspect demand ischemia in setting of current stressors  and volume overload. Troponins flat at 21, 23 and 20.   CAD HFpEF Moderate to severe AS Last TTE in 02/2024 with preserved EF and moderate to severe AS.  - Restarted on home Carvedilol  BID and amlodipine  - On ASA and Lipitor  80 mg daily - Holding Torsemide  on dialysis days   Anemia of ESRD Normocytic anemia Hgb 8.7 on admission. Improved from ~ 1 month ago. Last ESA administered 04/11/2024. Last IV iron  infusion 01/2024. Improved to 9.8 today.  - Labs with outpatient HD - IV iron  infusion per nephrology - Goal Hgb >7; transfuse as needed   Chronic pain management Severe OA of bilateral knees Continue home medications - Oxycodone  10 mg TID PRN (PTA) - Lyrica   - Gabapentin  200mg  on dialysis days -Tizanidine  4mg .   Emotional distress Anxiety Recent traumatic loss of brother-in law. Patient requests prayers - Consult to spiritual team - Atarax  PRN   GERD - Protonix  daily

## 2024-05-08 NOTE — ED Provider Notes (Signed)
 [facility] Provider Note   CSN: [id number] Arrival date & time: 05/08/24  9353     Patient presents with: Shortness of Breath   [name] is a 68 y.o. female with past medical history significant for for hypertension, diabetes, ESRD on dialysis, asthma, obesity, CAD, GERD, CHF who presents concern for shortness of breath.  Recently seen and evaluated, diagnosed with pneumonia, she has been taking antibiotics, levofloxacin .  On EMS arrival patient was slumped on the couch, initial O2 sat around mid 80s on room air.  100% on nonrebreather.  Initially with some weakness and was unable to get up from the couch independently.  She did initially endorse some sharp chest pain that went away.  Family reports some dementia/confusion at baseline.  Patient endorses some chills at home.  She reports her chest pain has resolved at this time.  She continues to feel some shortness of breath but better than prior to EMS arrival.    Shortness of Breath      Prior to Admission medications   Medication Sig Start Date End Date Taking? Authorizing Provider  albuterol  (VENTOLIN  HFA) 108 (90 Base) MCG/ACT inhaler Inhale 2 puffs into the lungs every 4 (four) hours as needed for wheezing or shortness of breath. 06/28/20   Vivienne Delon CHRISTELLA, PA-C  amLODipine  (NORVASC ) 10 MG tablet Take 10 mg by mouth See admin instructions. Take one tablet by mouth on Monday, Wednesdays, Fridays, and Sundays only    [provider]  ammonium lactate (LAC-HYDRIN) 12 % lotion Apply 1 Application topically daily as needed for dry skin. 12/15/23   [provider]  aspirin  81 MG EC tablet Take 1 tablet (81 mg total) by mouth daily. 09/20/20   Singh, Prashant K, MD  atorvastatin  (LIPITOR ) 80 MG tablet Take 1 tablet (80 mg total) by mouth every evening. Patient taking differently: Take 80 mg by mouth daily. 04/24/21   Bacigalupo, Angela M, MD  carvedilol  (COREG ) 12.5 MG  tablet Take 1 tablet (12.5 mg total) by mouth 2 (two) times daily with a meal. 09/19/21   Paige, Richerd PARAS, DO  cinacalcet  (SENSIPAR ) 30 MG tablet Take 30 mg by mouth daily. 04/11/24   [provider]  fluticasone  (FLONASE ) 50 MCG/ACT nasal spray Place 2 sprays into both nostrils daily as needed for allergies or rhinitis. 09/28/17   Vivienne Delon CHRISTELLA, PA-C  fluticasone  furoate-vilanterol (BREO ELLIPTA ) 200-25 MCG/INH AEPB Inhale 1 puff into the lungs daily. 08/30/20   Vivienne Delon CHRISTELLA, PA-C  gabapentin  (NEURONTIN ) 100 MG capsule Please take 2 capsules (200 mg) after dialysis on dialysis days only Tuesday, Thursday, Saturday. 03/09/24   Everhart, Kirstie, DO  HYDROcodone  bit-homatropine (HYCODAN) 5-1.5 MG/5ML syrup Take 5 mLs by mouth every 6 (six) hours as needed for cough. 05/04/24   Rolinda Rogue, MD  hydrOXYzine  (ATARAX ) 25 MG tablet Take 1 tablet (25 mg total) by mouth 2 (two) times daily. 11/17/22   Hongalgi, Trenda BIRCH, MD  levofloxacin  (LEVAQUIN ) 500 MG tablet Take 1 tablet (500 mg total) by mouth daily for 5 days. 05/04/24 05/09/24  Rolinda Rogue, MD  lidocaine  (LIDODERM ) 5 % Place 1 patch onto the skin daily. Remove & Discard patch within 12 hours or as directed by MD Patient taking differently: Place 1 patch onto the skin daily as needed (for pain). Remove & Discard patch within 12 hours or as directed by MD 06/25/23   Nettie, April, MD  naloxone  (NARCAN ) nasal spray 4 mg/0.1  mL Place 1 spray into the nose daily as needed (for overdose). 04/08/22   [provider]  nitroGLYCERIN  (NITROSTAT ) 0.4 MG SL tablet Place 1 tablet (0.4 mg total) under the tongue every 5 (five) minutes x 3 doses as needed for chest pain. 03/08/24   Howell Lunger, DO  Oxycodone  HCl 10 MG TABS Take 1 tablet (10 mg total) by mouth 4 (four) times daily. 03/30/24   Gomes, Adriana, DO  OZEMPIC, 2 MG/DOSE, 8 MG/3ML SOPN Inject 2 mg into the skin once a week.    [provider]  pantoprazole  (PROTONIX )  40 MG tablet Take 40 mg by mouth daily before breakfast.    [provider]  polyethylene glycol powder (GLYCOLAX /MIRALAX ) 17 GM/SCOOP powder Dissolve 1 capful (17g) in 4-8 ounces of liquid and take by mouth daily as needed for mild constipation 03/30/24   Gomes, Adriana, DO  pregabalin  (LYRICA ) 100 MG capsule Take 100 mg by mouth 2 (two) times daily. 05/02/24   [provider]  senna (SENOKOT) 8.6 MG TABS tablet Take 1 tablet (8.6 mg total) by mouth 2 (two) times daily. 03/30/24   Gomes, Adriana, DO  sevelamer  carbonate (RENVELA ) 800 MG tablet Take 1,600 mg by mouth 3 (three) times daily with meals. 12/31/23   [provider]  tiZANidine  (ZANAFLEX ) 4 MG tablet Take 1 tablet (4 mg total) by mouth daily as needed for muscle spasms. 03/08/24   Everhart, Kirstie, DO  torsemide  (DEMADEX ) 100 MG tablet Take 1 tablet (100mg ) on NON-dialysis days (Sunday, Monday, Wednesday, Friday) 03/08/24   Everhart, Kirstie, DO  valACYclovir  (VALTREX ) 500 MG tablet TAKE (1) TABLET BY MOUTH EVERY OTHER DAY. Patient taking differently: Take 500 mg by mouth daily. 01/24/21   Bacigalupo, Angela M, MD  VELPHORO  500 MG chewable tablet Chew 500 mg by mouth 5 (five) times daily. 05/04/24   [provider]    Allergies: Shellfish allergy, Diazepam , and Morphine     Review of Systems  Respiratory:  Positive for shortness of breath.   All other systems reviewed and are negative.   Updated Vital Signs BP (!) 149/59   Pulse 78   Temp 98.7 F (37.1 C) (Oral)   Resp (!) 22   Ht 5' 5 (1.651 m)   Wt 104.3 kg   SpO2 96%   BMI 38.27 kg/m   Physical Exam Vitals and nursing note reviewed.  Constitutional:      General: She is not in acute distress.    Appearance: Normal appearance.  HENT:     Head: Normocephalic and atraumatic.  Eyes:     General:        Right eye: No discharge.        Left eye: No discharge.  Cardiovascular:     Rate and Rhythm: Normal rate and regular rhythm.      Heart sounds: No murmur heard.    No friction rub. No gallop.  Pulmonary:     Effort: Pulmonary effort is normal.     Breath sounds: Normal breath sounds.     Comments: No wheezing, rhonchi, stridor rales, probably focal consolidation in lower lung fields Abdominal:     General: Bowel sounds are normal.     Palpations: Abdomen is soft.  Skin:    General: Skin is warm and dry.     Capillary Refill: Capillary refill takes less than 2 seconds.  Neurological:     Mental Status: She is alert and oriented to person, place, and time.  Psychiatric:  Mood and Affect: Mood normal.        Behavior: Behavior normal.     (all labs ordered are listed, but only abnormal results are displayed) Labs Reviewed  BASIC METABOLIC PANEL WITH GFR - Abnormal; Notable for the following components:      Result Value   Potassium 7.1 (*)    CO2 19 (*)    BUN 96 (*)    Creatinine, Ser 12.51 (*)    Calcium  7.5 (*)    GFR, Estimated 3 (*)    Anion gap 23 (*)    All other components within normal limits  CBC - Abnormal; Notable for the following components:   RBC 3.24 (*)    Hemoglobin 8.7 (*)    HCT 28.8 (*)    RDW 16.0 (*)    All other components within normal limits  TROPONIN I (HIGH SENSITIVITY) - Abnormal; Notable for the following components:   Troponin I (High Sensitivity) 21 (*)    All other components within normal limits  MAGNESIUM   I-STAT CG4 LACTIC ACID, ED  I-STAT CG4 LACTIC ACID, ED  TROPONIN I (HIGH SENSITIVITY)    EKG: None  Radiology: DG Chest Portable 1 View Result Date: 05/08/2024 EXAM: 1 VIEW XRAY OF THE CHEST 05/08/2024 07:08:49 AM COMPARISON: 05/04/2024 CLINICAL HISTORY: SOB, recent pneumonia, fever; rover. FINDINGS: LUNGS AND PLEURA: Scarring or atelectasis at the right lung base, without significant change. No pulmonary edema. No pleural effusion. No pneumothorax. HEART AND MEDIASTINUM: No acute abnormality of the cardiac and mediastinal silhouettes. BONES AND SOFT  TISSUES: No acute osseous abnormality. IMPRESSION: 1. Stable scarring or atelectasis at the right lung base. Electronically signed by: Norleen Kil MD 05/08/2024 07:35 AM EDT RP Workstation: [id number]     .Critical Care  Performed by: Rosan Sherlean DEL, PA-C Authorized by: Rosan Sherlean DEL, PA-C   Critical care provider statement:    Critical care time (minutes):  35   Critical care was necessary to treat or prevent imminent or life-threatening deterioration of the following conditions:  Metabolic crisis   Critical care was time spent personally by me on the following activities:  Development of treatment plan with patient or surrogate, discussions with consultants, evaluation of patient's response to treatment, examination of patient, ordering and review of laboratory studies, ordering and review of radiographic studies, ordering and performing treatments and interventions, pulse oximetry, re-evaluation of patient's condition and review of old charts   Care discussed with: admitting provider      Medications Ordered in the ED  acetaminophen  (TYLENOL ) tablet 650 mg (650 mg Oral Not Given 05/08/24 0836)  sodium zirconium cyclosilicate  (LOKELMA ) packet 10 g (has no administration in time range)  albuterol  (PROVENTIL ) (2.5 MG/3ML) 0.083% nebulizer solution 10 mg (has no administration in time range)  insulin  aspart (novoLOG ) injection 10 Units (has no administration in time range)    And  dextrose  50 % solution 50 mL (has no administration in time range)  calcium  gluconate 1 g/ 50 mL sodium chloride  IVPB (has no administration in time range)  HYDROmorphone  (DILAUDID ) injection 1 mg (has no administration in time range)  sodium bicarbonate  injection 50 mEq (has no administration in time range)  ipratropium-albuterol  (DUONEB) 0.5-2.5 (3) MG/3ML nebulizer solution 3 mL (3 mLs Nebulization Given 05/08/24 0837)                                    Medical Decision Making Amount  and/or  Complexity of Data Reviewed Labs: ordered. Radiology: ordered.  Risk OTC drugs. Prescription drug management.   This patient is a 68 y.o. female  who presents to the ED for concern of shob.   Differential diagnoses prior to evaluation: The emergent differential diagnosis includes, but is not limited to,  asthma exacerbation, COPD exacerbation, acute upper respiratory infection, acute bronchitis, chronic bronchitis, interstitial lung disease, ARDS, PE, pneumonia, atypical ACS, carbon monoxide poisoning, spontaneous pneumothorax, new CHF vs CHF exacerbation, versus other . This is not an exhaustive differential.   Past Medical History / Co-morbidities / Social History:  hypertension, diabetes, ESRD on dialysis, asthma, obesity, CAD, GERD, CHF  Additional history: Chart reviewed. Pertinent results include: Reviewed evaluation at urgent care on 10/23, diagnosis of new pneumonia  Physical Exam: Physical exam performed. The pertinent findings include: Increased work of breathing, tachypnea without any wheezing, rhonchi, question focal consolidation lower lung fields.  Worse when patient is lying flat.  She can breathe a lot easier when sitting up.  Some hypertension no blood pressure 149/59.  Tachypnea, respirations 22.  Lab Tests/Imaging studies: I personally interpreted labs/imaging and the pertinent results include: CBC with moderate chronic anemia, hemoglobin 8.7.  Her BMP is notable for critical hyperkalemia, calcium  7.1, bicarb deficit, CO2 19, elevated BUN, creatinine.  Patient has missed several dialysis sessions.  Initial troponin mildly elevated 21, suspect secondary to ESRD, but will trend delta.  Normal lactic acid.  Plain film chest x-Kilker with chronic atelectasis, scarring at right lower lung fields. I agree with the radiologist interpretation.  Cardiac monitoring: EKG obtained and interpreted by myself and attending physician which shows: Normal sinus rhythm, borderline prolonged  QT   Medications: I ordered medication including insulin , dextrose , Lokelma , albuterol , calcium  gluconate for critical hyperkalemia, will contact nephrology for emergent dialysis.  I have reviewed the patients home medicines and have made adjustments as needed.  Consults: Spoke with nephrology, Dr. Geralynn who will plan for dialysis during admission, spoke with hospitalist, IM teaching service who agrees to admission for pneumonia, hyperkalemia   Disposition: After consideration of the diagnostic results and the patients response to treatment, I feel that patient would benefit from admission for pneumonia, hyperkalemia, with need for dialysis during admission.   Final diagnoses:  Community acquired pneumonia, unspecified laterality  Hyperkalemia    ED Discharge Orders     None          Rosan Sherlean VEAR DEVONNA 05/08/24 1002    Melvenia Motto, MD 05/08/24 1513

## 2024-05-08 NOTE — ED Notes (Signed)
 IV team at bedside. Repeat labs to be collected at this time.

## 2024-05-08 NOTE — Plan of Care (Signed)
  Problem: Health Behavior/Discharge Planning: Goal: Ability to manage health-related needs will improve 05/08/2024 1853 by Tawni Greig RAMAN, RN Outcome: Progressing 05/08/2024 1853 by Tawni Greig RAMAN, RN Outcome: Progressing   Problem: Health Behavior/Discharge Planning: Goal: Ability to manage health-related needs will improve Outcome: Progressing   Problem: Clinical Measurements: Goal: Will remain free from infection Outcome: Progressing

## 2024-05-08 NOTE — Progress Notes (Addendum)
 Pt receives out-pt HD at [facility] on TTS 10:55 am chair time. Will continue to assist as needed.   Lavanda Deionte [facility] [phone]  Addendum 1:36pm Contacted [facility], they stated she missed last Thursday and Saturday treatments. Last dialyzed on Tuesday 21st. MD and nephrology informed.

## 2024-05-08 NOTE — Procedures (Signed)
 S: pt seen in HD unit, no c/o's at this time.   Vitals:   05/08/24 1240 05/08/24 1310 05/08/24 1330 05/08/24 1400  BP:  (!) 167/66 (!) 145/63 132/66  Pulse: 77 73 73 83  Resp: 14 11 12 16   Temp: 97.8 F (36.6 C)     TempSrc: Oral     SpO2: 92% 96% 97% 96%  Weight:      Height:        Recent Labs  Lab 05/08/24 0704 05/08/24 0759  HGB  --  8.7*  CALCIUM  7.5*  --   CREATININE 12.51*  --   K 7.1*  --     Inpatient medications:  acetaminophen   1,000 mg Oral Q6H   amLODipine   10 mg Oral See admin instructions   aspirin  EC  81 mg Oral Daily   atorvastatin   80 mg Oral QPM   carvedilol   12.5 mg Oral BID WC   Chlorhexidine  Gluconate Cloth  6 each Topical Q0600   cinacalcet   30 mg Oral Daily   fluticasone  furoate-vilanterol  1 puff Inhalation Daily   heparin   5,000 Units Subcutaneous Q8H   hydrOXYzine   25 mg Oral BID   [START ON 05/09/2024] pantoprazole   40 mg Oral QAC breakfast   pregabalin   100 mg Oral BID   senna  1 tablet Oral BID   sucroferric oxyhydroxide  500 mg Oral 5 X Daily    azithromycin      cefTRIAXone  (ROCEPHIN )  IV     albuterol , fluticasone , oxyCODONE , polyethylene glycol powder, tiZANidine   I was present at the procedure, reviewed the HD regimen and made appropriate changes.   Myer Fret MD  [facility] 05/08/2024, 2:18 PM

## 2024-05-08 NOTE — Progress Notes (Signed)
   05/08/24 1200  Spiritual Encounters  Type of Visit Initial  Care provided to: Patient  Referral source Patient request  OnCall Visit Yes   Chaplain responded to consult request for support. The patient shared that she experienced two losses in her family. Her older sister's daughter passed away a couple months ago and her youngest sister's husband passed away last 05-Jun-2024. She expressed that her strong relationship with God gives her strength to endure these hard moments.   Chaplain was compassionately present, listened to her story, acknowledged the pain, provided emotional support and offered a prayer.   M.Kubra Susanna Kerry Resident [phone]

## 2024-05-09 DIAGNOSIS — D631 Anemia in chronic kidney disease: Secondary | ICD-10-CM | POA: Diagnosis not present

## 2024-05-09 DIAGNOSIS — E875 Hyperkalemia: Secondary | ICD-10-CM | POA: Diagnosis not present

## 2024-05-09 DIAGNOSIS — Z992 Dependence on renal dialysis: Secondary | ICD-10-CM | POA: Diagnosis not present

## 2024-05-09 DIAGNOSIS — Z79899 Other long term (current) drug therapy: Secondary | ICD-10-CM

## 2024-05-09 DIAGNOSIS — N186 End stage renal disease: Secondary | ICD-10-CM | POA: Diagnosis not present

## 2024-05-09 DIAGNOSIS — E8889 Other specified metabolic disorders: Secondary | ICD-10-CM

## 2024-05-09 DIAGNOSIS — I2510 Atherosclerotic heart disease of native coronary artery without angina pectoris: Secondary | ICD-10-CM

## 2024-05-09 DIAGNOSIS — I5020 Unspecified systolic (congestive) heart failure: Secondary | ICD-10-CM

## 2024-05-09 DIAGNOSIS — J449 Chronic obstructive pulmonary disease, unspecified: Secondary | ICD-10-CM

## 2024-05-09 DIAGNOSIS — Z8701 Personal history of pneumonia (recurrent): Secondary | ICD-10-CM

## 2024-05-09 LAB — RENAL FUNCTION PANEL
Albumin: 3 g/dL — ABNORMAL LOW (ref 3.5–5.0)
Anion gap: 17 — ABNORMAL HIGH (ref 5–15)
BUN: 51 mg/dL — ABNORMAL HIGH (ref 8–23)
CO2: 23 mmol/L (ref 22–32)
Calcium: 8.2 mg/dL — ABNORMAL LOW (ref 8.9–10.3)
Chloride: 98 mmol/L (ref 98–111)
Creatinine, Ser: 8.19 mg/dL — ABNORMAL HIGH (ref 0.44–1.00)
GFR, Estimated: 5 mL/min — ABNORMAL LOW (ref >60)
Glucose, Bld: 80 mg/dL (ref 70–99)
Phosphorus: 7.8 mg/dL — ABNORMAL HIGH (ref 2.5–4.6)
Potassium: 5 mmol/L (ref 3.5–5.1)
Sodium: 138 mmol/L (ref 135–145)

## 2024-05-09 LAB — GLUCOSE, CAPILLARY
Glucose-Capillary: 73 mg/dL (ref 70–99)
Glucose-Capillary: 141 mg/dL — ABNORMAL HIGH (ref 70–99)
Glucose-Capillary: 126 mg/dL — ABNORMAL HIGH (ref 70–99)

## 2024-05-09 LAB — CBC
HCT: 30 % — ABNORMAL LOW (ref 36.0–46.0)
Hemoglobin: 9.3 g/dL — ABNORMAL LOW (ref 12.0–15.0)
MCH: 26.7 pg (ref 26.0–34.0)
MCHC: 31 g/dL (ref 30.0–36.0)
MCV: 86.2 fL (ref 80.0–100.0)
Platelets: 135 K/uL — ABNORMAL LOW (ref 150–400)
RBC: 3.48 MIL/uL — ABNORMAL LOW (ref 3.87–5.11)
RDW: 15.8 % — ABNORMAL HIGH (ref 11.5–15.5)
WBC: 4.9 K/uL (ref 4.0–10.5)
nRBC: 0 % (ref 0.0–0.2)

## 2024-05-09 LAB — HEPATITIS B SURFACE ANTIBODY, QUANTITATIVE: Hep B S AB Quant (Post): 125 m[IU]/mL

## 2024-05-09 MED ORDER — PREDNISONE 20 MG PO TABS
40.0000 mg | ORAL_TABLET | Freq: Every day | ORAL | Status: DC
  Administered 2024-05-09 – 2024-05-12 (×4): 40 mg via ORAL
  Filled 2024-05-09 (×4): qty 2

## 2024-05-09 MED ORDER — CHLORHEXIDINE GLUCONATE CLOTH 2 % EX PADS
6.0000 | MEDICATED_PAD | Freq: Every day | CUTANEOUS | Status: DC
  Administered 2024-05-09: 6 via TOPICAL

## 2024-05-09 MED ORDER — ALBUTEROL SULFATE (2.5 MG/3ML) 0.083% IN NEBU
2.5000 mg | INHALATION_SOLUTION | Freq: Four times a day (QID) | RESPIRATORY_TRACT | Status: AC
  Administered 2024-05-09 – 2024-05-10 (×4): 2.5 mg via RESPIRATORY_TRACT
  Filled 2024-05-09 (×7): qty 3

## 2024-05-09 NOTE — Care Management Obs Status (Signed)
 MEDICARE OBSERVATION STATUS NOTIFICATION   Patient Details  Name: [name] MRN: [id number] Date of Birth: 10-May-1956   Medicare Observation Status Notification Given:  Yes Verbally reviewed observation note with Silvia Dade at [phone]. Will deliver a copy to the patient home address.   Alexxa Sabet 05/09/2024, 10:34 AM

## 2024-05-09 NOTE — Progress Notes (Signed)
 HD#0 SUBJECTIVE:  Patient Summary: [name] is a 68 y.o. with a pertinent PMH of ESRD on HD TTS, moderate to severe AS, HFpEF, chronic pain due to knee OA, neuropathic pain, anemia of chronic disease, T2DM, HTN, HLD, asthma, obesity, who presented to the ED for difficulty breathing and admitted for hyperkalemia in the setting of missed dialysis sessions.  Overnight Events: None  Interim History: [name] is feeling somewhat better this morning. She has some shortness of breath that she states is at her baseline with ambulation. She expresses that she does not often move around much at home. She notes that she has pain all over her body that has been somewhat uncontrolled as she has not yet found a pain management specialist since moving to [place] from [place].  OBJECTIVE:  Vital Signs: Vitals:   05/08/24 1737 05/08/24 2037 05/09/24 0422 05/09/24 0500  BP: (!) 151/64 (!) 126/47 (!) 141/54   Pulse: 87 84 85   Resp: 18 18 18    Temp: 98.3 F (36.8 C) 98.1 F (36.7 C) 98.3 F (36.8 C)   TempSrc:      SpO2: 96% 94% 92%   Weight:    108.5 kg  Height:       Supplemental O2: Room Air SpO2: 92 %  Filed Weights   05/08/24 1400 05/08/24 1730 05/09/24 0500  Weight: 101.3 kg 108.4 kg 108.5 kg     Intake/Output Summary (Last 24 hours) at 05/09/2024 9380 Last data filed at 05/08/2024 2223 Gross per 24 hour  Intake 141.19 ml  Output 153 ml  Net -11.81 ml   Net IO Since Admission: -11.81 mL [05/09/24 0619]  Physical Exam: Physical Exam Constitutional:      General: She is not in acute distress.    Appearance: She is not ill-appearing.  Cardiovascular:     Rate and Rhythm: Normal rate and regular rhythm.  Pulmonary:     Breath sounds: No decreased breath sounds, wheezing, rhonchi or rales.  Abdominal:     Palpations: Abdomen is soft. There is no mass.     Tenderness: There is no abdominal tenderness. There is no guarding.  Musculoskeletal:     Right lower  leg: No edema.     Left lower leg: No edema.  Neurological:     Mental Status: She is alert.      Patient Lines/Drains/Airways Status     Active Line/Drains/Airways     Name Placement date Placement time Site Days   Peripheral IV 05/08/24 20 G 2.5 Anterior;Right;Upper Arm 05/08/24  0910  Arm  1   Fistula / Graft Left Upper arm Arteriovenous fistula 07/24/22  0745  Upper arm  655            Pertinent labs and imaging:      Latest Ref Rng & Units 05/09/2024    4:39 AM 05/08/2024    7:59 AM 03/30/2024   10:09 AM  CBC  WBC 4.0 - 10.5 K/uL 4.9  6.2  4.9   Hemoglobin 12.0 - 15.0 g/dL 9.3  8.7  7.5   Hematocrit 36.0 - 46.0 % 30.0  28.8  24.6   Platelets 150 - 400 K/uL 135  150  102        Latest Ref Rng & Units 05/09/2024    4:39 AM 05/08/2024    7:46 PM 05/08/2024    7:04 AM  CMP  Glucose 70 - 99 mg/dL 80  840  99   BUN 8 -  23 mg/dL 51  44  96   Creatinine 0.44 - 1.00 mg/dL 1.80  2.59  87.48   Sodium 135 - 145 mmol/L 138  137  140   Potassium 3.5 - 5.1 mmol/L 5.0  4.4  7.1   Chloride 98 - 111 mmol/L 98  95  98   CO2 22 - 32 mmol/L 23  24  19    Calcium  8.9 - 10.3 mg/dL 8.2  8.2  7.5     DG Chest Portable 1 View Result Date: 05/08/2024 EXAM: 1 VIEW XRAY OF THE CHEST 05/08/2024 07:08:49 AM COMPARISON: 05/04/2024 CLINICAL HISTORY: SOB, recent pneumonia, fever; rover. FINDINGS: LUNGS AND PLEURA: Scarring or atelectasis at the right lung base, without significant change. No pulmonary edema. No pleural effusion. No pneumothorax. HEART AND MEDIASTINUM: No acute abnormality of the cardiac and mediastinal silhouettes. BONES AND SOFT TISSUES: No acute osseous abnormality. IMPRESSION: 1. Stable scarring or atelectasis at the right lung base. Electronically signed by: Norleen Kil MD 05/08/2024 07:35 AM EDT RP Workstation: [id number]    ASSESSMENT/PLAN:  Assessment: Principal Problem:   Hyperkalemia Active Problems:   ESRD on hemodialysis (HCC)   Volume overload  [name] is a 68 y.o. with a pertinent PMH of ESRD on HD TTS, moderate to severe AS, HFpEF, chronic pain due to knee OA, neuropathic pain, anemia of chronic disease, T2DM, HTN, HLD, asthma, obesity, who presented to the ED for difficulty breathing and admitted for hyperkalemia and respiratory distress, in the setting of missed dialysis sessions.  Plan: Respiratory distress Volume overload Missed HD sessions COPD  No hypoxia, wheezing, or crackles on my examination. Given missed HD sessions, suspect volume overload driving increase work of breathing. Although there may be a component of a COPD exacerbation, as other physicians have auscultated wheezes. Given her desaturations with ambulation and intermittent wheezing we will also treat for COPD exacerbation. She is still 5 kgs above her dry weight and nephrology would like to perform dialysis again today. Suspect that some of her respiratory distress is due to deconditioning due to her limited mobility from her chronic pain. - Will re-evaluate post dialysis.  - Continued Breo Ellipta  - Duonebs 2.5mg  q6hr -Prednisone  40mg  daily for 5 days (last day 11/1) - Flonase  PRN - SPO2 89 - 93% -PT  have seen the patient and recommend SNF.   ESRD on HD TTS Metabolic bone disease Still makes urine. 5kgs above dry weight - HD while inpatient - HD navigator aware - Strict I/O - Avoid nephrotoxic drugs - Restarted on PTA meds -Velphoro  500mg  TID   Hyperkalemia (Resolved) Patient with ESRD on HD missing sessions for family tragedy.  S/p hyperkalemia protocol. No EKG changes noted EKG. K improved from 7.1 on admission to 5.0 now. - Appreciate nephrology's assistance with HD - Daily RFP - Cardiac telemetry   Pneumonia (Resolved) Diagnosed prior to admission. Finished a 5 day course of CAP treatment with 4 days of Levaquin  and one day of Ceftriaxone  and Azithromycin  given mildly prolonged QTC and hyperkalemia. - Continue Duonebs as above  Troponin  elevation (Resolved) Suspect demand ischemia in setting of current stressors and volume overload. Troponins flat at 21, 23 and 20.   CAD HFpEF Moderate to severe AS Last TTE in 02/2024 with preserved EF and moderate to severe AS.  - Restarted on Carvedilol  BID and amlodipine  - On ASA and Lipitor  80 mg daily - Holding Torsemide  on dialysis days, restart tomorrow   Anemia of ESRD Normocytic anemia Hgb 8.7  on admission. Improved from ~ 1 month ago. Last ESA administered 04/11/2024. lAst IV iron  infusion 01/2024. Improved to 9.3 today.  - Labs with outpatient HD - IV iron  infusion per nephrology - Goal Hgb >7; transfuse as needed   Chronic pain management Severe OA of bilateral knees - Oxycodone  10 mg TID PRN (PTA) - Lyrica   - Gabapentin  200mg  on dialysis days -Tizanidine  4mg .   Emotional distress Anxiety Recent traumatic loss of brother-in law. Patient requests prayers - Consult to spiritual team - Atarax  PRN   GERD - Protonix  daily  Best Practice: Diet: Renal diet VTE: heparin  injection 5,000 Units Start: 05/08/24 1400 SCDs Start: 05/08/24 1048 Code: Full  Disposition planning: Therapy Recs: SNF, DISPO: Anticipated discharge today or tomorrow to Skilled nursing facility pending SNF placement.  Signature:  Melvenia Napoleon [facility]  6:19 AM, 05/09/2024  On Call pager [phone]

## 2024-05-09 NOTE — Plan of Care (Signed)
  Problem: Health Behavior/Discharge Planning: Goal: Ability to manage health-related needs will improve Outcome: Progressing   Problem: Health Behavior/Discharge Planning: Goal: Ability to manage health-related needs will improve Outcome: Progressing   Problem: Clinical Measurements: Goal: Will remain free from infection Outcome: Progressing

## 2024-05-09 NOTE — Evaluation (Signed)
 Physical Therapy Evaluation Patient Details Name: [name] MRN: [id number] DOB: August 12, 1955 Today's Date: 05/09/2024  History of Present Illness  [name] is a 68 y.o. female who presented to the ED for difficulty breathing, found to have PNA. PMH: ESRD on HD TTS, moderate to severe AS, HFpEF, chronic pain due to knee OA, neuropathic pain, anemia of chronic disease, T2DM, HTN, HLD, asthma, obesity   Clinical Impression  Pt admitted with above. Pt presenting with wheezing and coughing with any activity, requiring maxA for bed mobility, modA for OOB and is unable to ambulate more than 2 steps to chair prior to needing to sit due to SOB, wheezing, and fatigue. PTA pt was living with sister and using a cane however recently has missed 2 HD treatments due to inability to amb and get in/out of house due to 3 steps she was unable to navigate. Pt's SPO2 dec to 82% on RA during mobility with noted wheezing and SOB. Pt to strongly benefit from inpatient rehab program < 3 hrs a day to address above deficits and achieve safe level of function to transition home with sister. Acute PT to cont to follow.        If plan is discharge home, recommend the following: A lot of help with walking and/or transfers;A lot of help with bathing/dressing/bathroom;Help with stairs or ramp for entrance;Supervision due to cognitive status;Assist for transportation   Can travel by private vehicle   No    Equipment Recommendations Rolling walker (2 wheels) (TBD at next venue)  Recommendations for Other Services       Functional Status Assessment Patient has had a recent decline in their functional status and demonstrates the ability to make significant improvements in function in a reasonable and predictable amount of time.     Precautions / Restrictions Precautions Precautions: Fall Restrictions Weight Bearing Restrictions Per Provider Order: No      Mobility  Bed Mobility Overal bed mobility: Needs  Assistance Bed Mobility: Supine to Sit     Supine to sit: Max assist, HOB elevated, Used rails     General bed mobility comments: increased time, pt requesting assist via pulling bed pad as pt unable to tolerate extremities being touched due to pain. maxA with HOB all the way up to bring hips to EOB    Transfers Overall transfer level: Needs assistance Equipment used: Rolling walker (2 wheels) Transfers: Sit to/from Stand, Bed to chair/wheelchair/BSC Sit to Stand: Mod assist   Step pivot transfers: Mod assist       General transfer comment: pt pulled up with modAx2 to scale x 2 for weight, modAx1 to stand up from bed and then to complete step pvt to chair with RW, increased time, significant increase in UE support    Ambulation/Gait               General Gait Details: unable this date  Stairs            Wheelchair Mobility     Tilt Bed    Modified Rankin (Stroke Patients Only)       Balance Overall balance assessment: Needs assistance Sitting-balance support: Feet supported, Bilateral upper extremity supported Sitting balance-Leahy Scale: Fair Sitting balance - Comments: posterior lean when raising LEs Postural control: Posterior lean Standing balance support: Bilateral upper extremity supported, During functional activity, Reliant on assistive device for balance Standing balance-Leahy Scale: Zero Standing balance comment: reliant on RW and external assist  Pertinent Vitals/Pain Pain Assessment Pain Assessment: Faces Faces Pain Scale: Hurts even more Pain Location: bilat feet and hands to touch Pain Descriptors / Indicators: Pins and needles Pain Intervention(s): Monitored during session    Home Living Family/patient expects to be discharged to:: Private residence Living Arrangements:  (sister) Available Help at Discharge: Family;Available 24 hours/day Type of Home: House Home Access: Stairs to  enter Entrance Stairs-Rails: None Entrance Stairs-Number of Steps: 2 Alternate Level Stairs-Number of Steps: 13 Home Layout: Two level;Able to live on main level with bedroom/bathroom Home Equipment: Cane - quad      Prior Function Prior Level of Function : Needs assist             Mobility Comments: pt reports minimal walking, uses a cane, doesn't have a walker, has missed 2 HD sessions due to not being able to get in/out of house due to step negotiation ADLs Comments: pt able to feed self, family does the cooking, pt does sponge baths,  some help needed with dressing     Extremity/Trunk Assessment   Upper Extremity Assessment Upper Extremity Assessment: Generalized weakness (bilat hand numbness and neuropathic pain in fingers)    Lower Extremity Assessment Lower Extremity Assessment: Generalized weakness (bilat neuropathic pain in lower legs, especially feet)    Cervical / Trunk Assessment Cervical / Trunk Assessment: Normal  Communication   Communication Communication: No apparent difficulties    Cognition Arousal: Alert Behavior During Therapy: WFL for tasks assessed/performed   PT - Cognitive impairments: No family/caregiver present to determine baseline                       PT - Cognition Comments: decreased insight to deficits and safety however ultimately agreeed with therapist regarding inability to safely manage at home at this time due to inability to walk and get to HD Following commands: Intact       Cueing Cueing Techniques: Verbal cues, Tactile cues     General Comments General comments (skin integrity, edema, etc.): pt with noted wheezing, SpO2 dec to 82% on RA during movement, 90% on RA at rest, MD notified    Exercises     Assessment/Plan    PT Assessment Patient needs continued PT services  PT Problem List Decreased strength;Decreased range of motion;Decreased activity tolerance;Decreased balance;Decreased mobility;Decreased  knowledge of use of DME;Decreased safety awareness;Obesity       PT Treatment Interventions DME instruction;Gait training;Stair training;Functional mobility training;Therapeutic activities;Therapeutic exercise;Balance training    PT Goals (Current goals can be found in the Care Plan section)  Acute Rehab PT Goals Patient Stated Goal: walk on my own again PT Goal Formulation: With patient Time For Goal Achievement: 05/23/24 Potential to Achieve Goals: Good    Frequency Min 2X/week     Co-evaluation               AM-PAC PT 6 Clicks Mobility  Outcome Measure Help needed turning from your back to your side while in a flat bed without using bedrails?: A Lot Help needed moving from lying on your back to sitting on the side of a flat bed without using bedrails?: A Lot Help needed moving to and from a bed to a chair (including a wheelchair)?: A Lot Help needed standing up from a chair using your arms (e.g., wheelchair or bedside chair)?: A Lot Help needed to walk in hospital room?: Total Help needed climbing 3-5 steps with a railing? : Total 6 Click Score: 10  End of Session Equipment Utilized During Treatment: Gait belt Activity Tolerance: Patient limited by fatigue Patient left: in chair;with call bell/phone within reach;with chair alarm set Nurse Communication: Mobility status PT Visit Diagnosis: Unsteadiness on feet (R26.81);Muscle weakness (generalized) (M62.81);Difficulty in walking, not elsewhere classified (R26.2)    Time: 9162-9085 PT Time Calculation (min) (ACUTE ONLY): 37 min   Charges:   PT Evaluation $PT Eval Moderate Complexity: 1 Mod PT Treatments $Therapeutic Activity: 8-22 mins PT General Charges $$ ACUTE PT VISIT: 1 Visit         [name], PT, DPT Acute Rehabilitation Services Secure chat preferred Office #: [phone]   [name] 05/09/2024, 9:29 AM

## 2024-05-09 NOTE — Progress Notes (Deleted)
 [facility] Progress Note  Subjective:    Vitals:   05/08/24 2037 05/09/24 0422 05/09/24 0500 05/09/24 0700  BP: (!) 126/47 (!) 141/54  (!) 144/52  Pulse: 84 85  84  Resp: 18 18    Temp: 98.1 F (36.7 C) 98.3 F (36.8 C)  98.2 F (36.8 C)  TempSrc:    Oral  SpO2: 94% 92%  97%  Weight:   108.5 kg   Height:        Exam: Gen alert, no distress, no O2 Sclera anicteric, throat clear  No jvd or bruits Chest clear bilat to bases RRR no MRG Abd soft ntnd no mass or ascites +bs Ext no LE or UE edema, no other edema Neuro is alert, Ox 3 , nf    AVF+bruit     OP HD: TTS G-O  4.5h  B450   102kg  2K bath  AVF    Heparin  3000 Skips HD somewhat frequently Usually comes off 1-3kg over Last Op HD 10/21, post wt 105kg     Assessment/ Plan: Hyperkalemia: k+ was 7.1 yest, improved w/ HD.  ESRD: on HD TTS. HD yest here. HD again today/ tonight.  HTN: on norvasc  and coreg  at home, resume prn per pmd.  Volume: still up 5kg, and dropping SpO2's this am. Will need further HD and vol removal while here.  Anemia of esrd: Hb 8- 9, follow, transfuse prn .      Myer Fret MD  [facility] 05/09/2024, 9:32 AM  Recent Labs  Lab 05/08/24 0759 05/08/24 1946 05/09/24 0439  HGB 8.7*  --  9.3*  ALBUMIN   --   --  3.0*  CALCIUM   --  8.2* 8.2*  PHOS  --   --  7.8*  CREATININE  --  7.40* 8.19*  K  --  4.4 5.0   No results for input(s): IRON , TIBC, FERRITIN in the last 168 hours. Inpatient medications:  acetaminophen   1,000 mg Oral Q6H   amLODipine   10 mg Oral See admin instructions   aspirin  EC  81 mg Oral Daily   atorvastatin   80 mg Oral QPM   carvedilol   12.5 mg Oral BID WC   Chlorhexidine  Gluconate Cloth  6 each Topical Q0600   Chlorhexidine  Gluconate Cloth  6 each Topical Q0600   cinacalcet   30 mg Oral Q breakfast   fluticasone  furoate-vilanterol  1 puff Inhalation Daily   gabapentin   200 mg Oral Q T,Th,Sa-HD   heparin   5,000 Units Subcutaneous Q8H   hydrOXYzine    25 mg Oral BID   pantoprazole   40 mg Oral QAC breakfast   pregabalin   100 mg Oral BID   senna  1 tablet Oral BID   sucroferric oxyhydroxide  500 mg Oral TID WC    albuterol , fluticasone , oxyCODONE , polyethylene glycol powder, tiZANidine 

## 2024-05-09 NOTE — NC FL2 (Signed)
 [place]  MEDICAID FL2 LEVEL OF CARE FORM     IDENTIFICATION  Patient Name: [name] Birthdate: April 02, 1956 Sex: female Admission Date (Current Location): 05/08/2024  [facility] and [place] Number:  Producer, Television/film/video and Address:  [facility], [address], [place], [place] [address]      Provider Number: [id number]  Attending Physician Name and Address:  Shawn Sick, MD  Relative Name and Phone Number:  [place],Jazzslyn (Daughter)  [phone]    Current Level of Care: Hospital Recommended Level of Care: Skilled Nursing Facility Prior Approval Number:    Date Approved/Denied:   PASRR Number: [id number]  Discharge Plan: SNF    Current Diagnoses: Patient Active Problem List   Diagnosis Date Noted   Knee pain, right 03/28/2024   Weakness of lower extremity 03/28/2024   Refusal of blood product 03/08/2024   Acute hypoxic respiratory failure (HCC) 03/03/2024   Chronic health problem 03/03/2024   Type 2 diabetes mellitus with diabetic foot infection (HCC) 11/15/2022   Diabetic foot infection (HCC) 11/14/2022   Pancolitis 10/21/2022   Osteomyelitis of left foot (HCC) 10/21/2022   Acute encephalopathy 10/20/2022   Cellulitis of left lower extremity 07/23/2022   Type 2 diabetes mellitus with chronic kidney disease, with long-term current use of insulin  (HCC) 07/23/2022   GERD without esophagitis 07/23/2022   Peripheral neuropathy 07/23/2022   GI bleed 09/17/2021   Severe sepsis (HCC) 04/21/2021   COVID-19 virus infection 04/20/2021   Upper GI bleed 09/17/2020   Prolonged QT interval 09/17/2020   COPD (chronic obstructive pulmonary disease) (HCC) 09/17/2020   Acute pulmonary edema (HCC) 09/14/2020   Acute respiratory failure with hypoxia (HCC) 09/13/2020   Allergy, unspecified, initial encounter 03/29/2020   Anaphylactic shock, unspecified, initial encounter 03/29/2020   Volume overload 01/30/2020   Pain in left upper arm 01/18/2020    Cellulitis 01/16/2020   Anemia due to end stage renal disease (HCC) 01/16/2020   Diabetic polyneuropathy associated with type 2 diabetes mellitus (HCC) 11/06/2019   ESRD on hemodialysis (HCC) 11/06/2019   Nystagmus 11/06/2019   PVD (peripheral vascular disease) 11/06/2019   Vertigo of central origin 11/06/2019   Weakness    Hypervolemia associated with renal insufficiency 10/31/2019   BMI 45.0-49.9, adult (HCC) 10/18/2019   Ascending aortic aneurysm 03/27/2019   Puncture wound without foreign body of left forearm, initial encounter 01/02/2019   Non-healing wound of upper extremity 12/28/2018   Unspecified open wound of left upper arm, initial encounter 12/06/2018   Complication from renal dialysis device 10/20/2018   Iron  deficiency anemia, unspecified 10/18/2018   ESRD (end stage renal disease) on dialysis (HCC) 06/01/2018   ARF (acute renal failure) 05/09/2018   Colon cancer screening    LGSIL on Pap smear of cervix 01/27/2018   Gout 12/25/2017   AKI (acute kidney injury) 12/24/2017   Hyperglycemia 12/24/2017   Acute on chronic diastolic CHF (congestive heart failure) (HCC) 10/03/2017   Chronic diastolic heart failure (HCC) 09/30/2017   Lymphedema 09/30/2017   Acute metabolic encephalopathy 06/02/2017   Aortic stenosis    Acute pain of both knees 02/12/2017   Chronic gout due to renal impairment of multiple sites without tophus 02/12/2017   Esophageal dysphagia 02/12/2017   Osteoarthritis 01/22/2017   Diabetic hyperosmolar non-ketotic state (HCC) 12/13/2016   CAD (coronary artery disease) 12/13/2016   Hyperlipidemia 12/13/2016   Hyperphosphatemia 12/13/2016   Acute diastolic CHF (congestive heart failure) (HCC)    Hyperkalemia 08/21/2016   Asthma 11/29/2015  Depression 09/05/2015   GERD (gastroesophageal reflux disease) 03/25/2015   Environmental allergies 03/14/2015   SOB (shortness of breath) 02/15/2015   Chronic hepatitis C without hepatic coma (HCC) 01/31/2015    Diabetic neuropathy (HCC) 01/31/2015   OSA (obstructive sleep apnea) 01/01/2015   Poor dentition 11/13/2014   Essential hypertension 10/08/2014   Morbid (severe) obesity due to excess calories (HCC) 10/08/2014   Shortness of breath    Localized, primary osteoarthritis 01/25/2014   Family history of diabetes mellitus 03/31/2013   Pain in limb 03/31/2013   Uncontrolled type 2 diabetes mellitus with hyperosmolar nonketotic hyperglycemia (HCC) 03/25/2012    Orientation RESPIRATION BLADDER Height & Weight     Self, Time, Situation, Place  Normal Continent Weight: 239 lb 3.2 oz (108.5 kg) Height:  5' 7 (170.2 cm)  BEHAVIORAL SYMPTOMS/MOOD NEUROLOGICAL BOWEL NUTRITION STATUS      Incontinent Diet  AMBULATORY STATUS COMMUNICATION OF NEEDS Skin   Extensive Assist Verbally Normal                       Personal Care Assistance Level of Assistance  Bathing, Feeding, Dressing Bathing Assistance: Maximum assistance Feeding assistance: Independent Dressing Assistance: Maximum assistance     Functional Limitations Info  Sight, Hearing, Speech Sight Info: Impaired Hearing Info: Adequate Speech Info: Adequate    SPECIAL CARE FACTORS FREQUENCY  PT (By licensed PT), OT (By licensed OT)     PT Frequency: 5x/wk OT Frequency: 5x/wk            Contractures Contractures Info: Not present    Additional Factors Info  Allergies, Code Status Code Status Info: FULL Allergies Info: Shellfish Allergy  Diazepam   Morphine            Current Medications (05/09/2024):  This is the current hospital active medication list Current Facility-Administered Medications  Medication Dose Route Frequency Provider Last Rate Last Admin   acetaminophen  (TYLENOL ) tablet 1,000 mg  1,000 mg Oral Q6H Gomez-Caraballo, Maria, MD   1,000 mg at 05/09/24 1200   albuterol  (PROVENTIL ) (2.5 MG/3ML) 0.083% nebulizer solution 2.5 mg  2.5 mg Nebulization Q6H Gomez-Caraballo, Maria, MD       amLODipine  (NORVASC )  tablet 10 mg  10 mg Oral See admin instructions Gomez-Caraballo, Maria, MD   10 mg at 05/08/24 1746   aspirin  EC tablet 81 mg  81 mg Oral Daily Gomez-Caraballo, Maria, MD   81 mg at 05/09/24 0901   atorvastatin  (LIPITOR ) tablet 80 mg  80 mg Oral QPM Gomez-Caraballo, Maria, MD   80 mg at 05/08/24 1746   carvedilol  (COREG ) tablet 12.5 mg  12.5 mg Oral BID WC Gomez-Caraballo, Maria, MD   12.5 mg at 05/09/24 0902   Chlorhexidine  Gluconate Cloth 2 % PADS 6 each  6 each Topical Q0600 Elnora Ip, MD   6 each at 05/09/24 0538   Chlorhexidine  Gluconate Cloth 2 % PADS 6 each  6 each Topical Q0600 Collins, Samantha G, PA-C   6 each at 05/09/24 9051   cinacalcet  (SENSIPAR ) tablet 30 mg  30 mg Oral Q breakfast Shawn Sick, MD   30 mg at 05/09/24 0901   fluticasone  (FLONASE ) 50 MCG/ACT nasal spray 2 spray  2 spray Each Nare Daily PRN Elnora Ip, MD       fluticasone  furoate-vilanterol (BREO ELLIPTA ) 200-25 MCG/INH 1 puff  1 puff Inhalation Daily Gomez-Caraballo, Maria, MD   1 puff at 05/09/24 1100   gabapentin  (NEURONTIN ) capsule 200 mg  200 mg Oral Q T,Th,Sa-HD  Tobie Gaines, DO   200 mg at 05/08/24 2222   heparin  injection 5,000 Units  5,000 Units Subcutaneous Q8H Gomez-Caraballo, Maria, MD   5,000 Units at 05/09/24 9462   hydrOXYzine  (ATARAX ) tablet 25 mg  25 mg Oral BID Gomez-Caraballo, Maria, MD   25 mg at 05/09/24 0902   oxyCODONE  (Oxy IR/ROXICODONE ) immediate release tablet 10 mg  10 mg Oral TID PRN Gomez-Caraballo, Maria, MD   10 mg at 05/09/24 1105   pantoprazole  (PROTONIX ) EC tablet 40 mg  40 mg Oral QAC breakfast Gomez-Caraballo, Maria, MD   40 mg at 05/09/24 0901   polyethylene glycol powder (GLYCOLAX /MIRALAX ) container 17 g  17 g Oral Daily PRN Gomez-Caraballo, Maria, MD       predniSONE  (DELTASONE ) tablet 40 mg  40 mg Oral Q breakfast Gomez-Caraballo, Maria, MD   40 mg at 05/09/24 1249   pregabalin  (LYRICA ) capsule 100 mg  100 mg Oral BID Gomez-Caraballo, Maria, MD   100 mg at  05/09/24 0901   senna (SENOKOT) tablet 8.6 mg  1 tablet Oral BID Gomez-Caraballo, Maria, MD   8.6 mg at 05/09/24 0902   sucroferric oxyhydroxide (VELPHORO ) chewable tablet 500 mg  500 mg Oral TID WC Smucker, Nathanael, MD   500 mg at 05/09/24 1250   tiZANidine  (ZANAFLEX ) tablet 4 mg  4 mg Oral Daily PRN Gomez-Caraballo, Maria, MD         Discharge Medications: Please see discharge summary for a list of discharge medications.  Relevant Imaging Results:  Relevant Lab Results:   Additional Information SS# [id number]  [place], LCSWA

## 2024-05-09 NOTE — Progress Notes (Signed)
 [facility] Progress Note   Subjective:   Pt seen in room, on RA but O2 sats dropped to 80 when just standing with PT. She reports wheezing and coughing, Denies CP and dizziness.   Objective Vitals:   05/08/24 2037 05/09/24 0422 05/09/24 0500 05/09/24 0700  BP: (!) 126/47 (!) 141/54  (!) 144/52  Pulse: 84 85  84  Resp: 18 18    Temp: 98.1 F (36.7 C) 98.3 F (36.8 C)  98.2 F (36.8 C)  TempSrc:    Oral  SpO2: 94% 92%  97%  Weight:   108.5 kg   Height:       Physical Exam General: Alert female in NAD Heart: RRR, no murmurs, rubs or gallops Lungs: + expiratory wheeze RLL  Abdomen: Soft, non-distended, +BS Extremities: no edema b/l lower extremities Dialysis Access: AVF + t/b  Additional Objective Labs: Basic Metabolic Panel: Recent Labs  Lab 05/08/24 0704 05/08/24 1946 05/09/24 0439  NA 140 137 138  K 7.1* 4.4 5.0  CL 98 95* 98  CO2 19* 24 23  GLUCOSE 99 159* 80  BUN 96* 44* 51*  CREATININE 12.51* 7.40* 8.19*  CALCIUM  7.5* 8.2* 8.2*  PHOS  --   --  7.8*   Liver Function Tests: Recent Labs  Lab 05/09/24 0439  ALBUMIN  3.0*   No results for input(s): LIPASE, AMYLASE in the last 168 hours. CBC: Recent Labs  Lab 05/08/24 0759 05/09/24 0439  WBC 6.2 4.9  HGB 8.7* 9.3*  HCT 28.8* 30.0*  MCV 88.9 86.2  PLT 150 135*   Blood Culture    Component Value Date/Time   SDES SYNOVIAL 03/28/2024 1045   SPECREQUEST RIGHT KNEE 03/28/2024 1045   CULT  03/28/2024 1045    NO GROWTH 3 DAYS Performed at [facility], [address], [place], [place] [address]    REPTSTATUS 03/31/2024 FINAL 03/28/2024 1045    Cardiac Enzymes: No results for input(s): CKTOTAL, CKMB, CKMBINDEX, TROPONINI in the last 168 hours. CBG: Recent Labs  Lab 05/09/24 0824  GLUCAP 73   Iron  Studies: No results for input(s): IRON , TIBC, TRANSFERRIN, FERRITIN in the last 72 hours. @lablastinr3 @ Studies/Results: DG Chest Portable 1 View Result  Date: 05/08/2024 EXAM: 1 VIEW XRAY OF THE CHEST 05/08/2024 07:08:49 AM COMPARISON: 05/04/2024 CLINICAL HISTORY: SOB, recent pneumonia, fever; rover. FINDINGS: LUNGS AND PLEURA: Scarring or atelectasis at the right lung base, without significant change. No pulmonary edema. No pleural effusion. No pneumothorax. HEART AND MEDIASTINUM: No acute abnormality of the cardiac and mediastinal silhouettes. BONES AND SOFT TISSUES: No acute osseous abnormality. IMPRESSION: 1. Stable scarring or atelectasis at the right lung base. Electronically signed by: Norleen Kil MD 05/08/2024 07:35 AM EDT RP Workstation: [id number]   Medications:   acetaminophen   1,000 mg Oral Q6H   amLODipine   10 mg Oral See admin instructions   aspirin  EC  81 mg Oral Daily   atorvastatin   80 mg Oral QPM   carvedilol   12.5 mg Oral BID WC   Chlorhexidine  Gluconate Cloth  6 each Topical Q0600   cinacalcet   30 mg Oral Q breakfast   fluticasone  furoate-vilanterol  1 puff Inhalation Daily   gabapentin   200 mg Oral Q T,Th,Sa-HD   heparin   5,000 Units Subcutaneous Q8H   hydrOXYzine   25 mg Oral BID   pantoprazole   40 mg Oral QAC breakfast   pregabalin   100 mg Oral BID   senna  1 tablet Oral BID   sucroferric oxyhydroxide  500  mg Oral TID WC    Dialysis Orders:  TTS G-O  4.5h  B450   102kg  2K bath  AVF    Heparin  3000 Skips HD somewhat frequently Usually comes off 1-3kg over Last Op HD 10/21, post wt 105kg    Assessment/Plan: Hyperkalemia: severe w/ K+ 7.1, no severe EKG changes. Resolved post HD ESRD: on HD TTS. Had extra HD yesterday, still needs more fluid off, HD again today HTN: on norvasc  and coreg  at home, resume prn per pmd.  Volume: see above, further UF with HD today.  Anemia of esrd: Hb 9.3- improving. Dispo: possible SNF per PT  [name], PA-C 05/09/2024, 9:15 AM  [facility] Pager: [phone]

## 2024-05-09 NOTE — Evaluation (Signed)
 Occupational Therapy Evaluation Patient Details Name: [name] MRN: [id number] DOB: 1955/10/17 Today's Date: 05/09/2024   History of Present Illness   [name] is a 68 y.o. female who presented to the ED for difficulty breathing, found to have PNA. PMH: ESRD on HD TTS, moderate to severe AS, HFpEF, chronic pain due to knee OA, neuropathic pain, anemia of chronic disease, T2DM, HTN, HLD, asthma, obesity     Clinical Impressions Pt c/o fatigue, weakness, and pain in B hands/knees. Pt lives with sister who is available 24/7, also has support of children if needed. PLOF has needed help from family to stand, uses cane to ambulate short distances, has been ambulating less over the months due to pain. Pt with neuropathy to B hands limiting ROM and strength. Pt currently requires mod A for transfer with RW, needs help with LB ADLs at baseline, min A for UB dressing/bathing, limited due to decreased activity tolerance, pain, and weakness. Pt would benefit from postacute rehab <3hrs/day to maximize functional strength and improve mobility to reduce caregiver burden at home, will continue to see acutely to progress as able.      If plan is discharge home, recommend the following:   A lot of help with walking and/or transfers;A lot of help with bathing/dressing/bathroom;Assistance with cooking/housework;Assist for transportation;Help with stairs or ramp for entrance     Functional Status Assessment   Patient has had a recent decline in their functional status and demonstrates the ability to make significant improvements in function in a reasonable and predictable amount of time.     Equipment Recommendations   Other (comment) (defer)     Recommendations for Other Services         Precautions/Restrictions   Precautions Precautions: Fall Recall of Precautions/Restrictions: Intact Restrictions Weight Bearing Restrictions Per Provider Order: No     Mobility Bed  Mobility Overal bed mobility: Needs Assistance             General bed mobility comments: in recliner    Transfers Overall transfer level: Needs assistance                 General transfer comment: Pt in recliner, declined standing due to fatigue, just got into chair, mod A with PT earlier      Balance Overall balance assessment: Needs assistance Sitting-balance support: No upper extremity supported, Feet supported Sitting balance-Leahy Scale: Fair                                     ADL either performed or assessed with clinical judgement   ADL Overall ADL's : Needs assistance/impaired Eating/Feeding: Independent   Grooming: Set up;Sitting   Upper Body Bathing: Minimal assistance;Sitting   Lower Body Bathing: Maximal assistance;Sitting/lateral leans;Sit to/from stand   Upper Body Dressing : Minimal assistance;Sitting   Lower Body Dressing: Maximal assistance;Sitting/lateral leans;Sit to/from stand   Toilet Transfer: Moderate assistance;Rolling walker (2 wheels);BSC/3in1   Toileting- Clothing Manipulation and Hygiene: Minimal assistance;Sitting/lateral lean;Sit to/from stand         General ADL Comments: Pt with significant BLE weakness limiting transfers, mod A with RW. Pt needs help with LB ADLs at baseline, poor FM skills and weak grip make ADLs more difficult for Pt.     Vision Baseline Vision/History: 1 Wears glasses Ability to See in Adequate Light: 0 Adequate Patient Visual Report: No change from baseline  Perception         Praxis         Pertinent Vitals/Pain Pain Assessment Pain Assessment: 0-10 Pain Score: 6  Pain Location: B knees, R hand Pain Descriptors / Indicators: Pins and needles, Contraction, Discomfort Pain Intervention(s): Monitored during session     Extremity/Trunk Assessment Upper Extremity Assessment Upper Extremity Assessment: RUE deficits/detail;LUE deficits/detail RUE Deficits / Details:  history of neuropathy in B hands, B shoulder stiffness, significant weakness throughout BUES, limited FM skills RUE Sensation: history of peripheral neuropathy RUE Coordination: decreased fine motor;decreased gross motor LUE Deficits / Details: history of neuropathy in B hands, B shoulder stiffness, significant weakness throughout BUES, limited FM skills LUE Sensation: history of peripheral neuropathy LUE Coordination: decreased fine motor;decreased gross motor           Communication Communication Communication: No apparent difficulties   Cognition Arousal: Alert Behavior During Therapy: WFL for tasks assessed/performed Cognition: Cognition impaired   Orientation impairments: Time         OT - Cognition Comments: Pt required 3X for what year it is, grossly A/O                 Following commands: Intact       Cueing  General Comments   Cueing Techniques: Verbal cues;Tactile cues      Exercises     Shoulder Instructions      Home Living Family/patient expects to be discharged to:: Private residence Living Arrangements: Other relatives Available Help at Discharge: Family;Available 24 hours/day Type of Home: House Home Access: Stairs to enter [employer] of Steps: 2 Entrance Stairs-Rails: None Home Layout: Two level;Able to live on main level with bedroom/bathroom Alternate Level Stairs-Number of Steps: 13   Bathroom Shower/Tub: Chief Strategy Officer: Standard Bathroom Accessibility: Yes   Home Equipment: Cane - quad   Additional Comments: Pt lives with sister, daughter/son can also be there if needed.      Prior Functioning/Environment Prior Level of Function : Needs assist             Mobility Comments: pt reports minimal walking, uses a cane, doesn't have a walker, family has been helping her stand up for months, has missed 2 HD sessions due to not being able to get in/out of house due to step negotiation ADLs  Comments: pt able to feed self, family does the cooking, pt does sponge baths,  some help needed with dressing for socks/shoes    OT Problem List: Decreased strength;Decreased range of motion;Decreased activity tolerance;Impaired balance (sitting and/or standing);Decreased safety awareness;Pain;Impaired UE functional use;Obesity   OT Treatment/Interventions: Self-care/ADL training;Therapeutic exercise;Energy conservation;DME and/or AE instruction;Therapeutic activities;Patient/family education;Balance training      OT Goals(Current goals can be found in the care plan section)   Acute Rehab OT Goals Patient Stated Goal: to improve strength OT Goal Formulation: With patient Time For Goal Achievement: 05/23/24 Potential to Achieve Goals: Good   OT Frequency:  Min 2X/week    Co-evaluation              AM-PAC OT 6 Clicks Daily Activity     Outcome Measure Help from another person eating meals?: None Help from another person taking care of personal grooming?: A Little Help from another person toileting, which includes using toliet, bedpan, or urinal?: A Little Help from another person bathing (including washing, rinsing, drying)?: A Lot Help from another person to put on and taking off regular upper body clothing?: A Little Help from  another person to put on and taking off regular lower body clothing?: A Lot 6 Click Score: 17   End of Session Nurse Communication: Mobility status  Activity Tolerance: Patient limited by fatigue Patient left: in chair;with call bell/phone within reach  OT Visit Diagnosis: Unsteadiness on feet (R26.81);Other abnormalities of gait and mobility (R26.89);Muscle weakness (generalized) (M62.81);Pain Pain - part of body: Knee;Hand                Time: 8895-8868 OT Time Calculation (min): 27 min Charges:  OT General Charges $OT Visit: 1 Visit OT Evaluation $OT Eval Moderate Complexity: 1 Mod OT Treatments $Self Care/Home Management : 8-22  mins  [address] OTR/L   Elouise JONELLE Bott 05/09/2024, 12:01 PM

## 2024-05-09 NOTE — TOC Progression Note (Signed)
 Transition of Care [facility]) - Progression Note    Patient Details  Name: [name] MRN: [id number] Date of Birth: June 15, 1956  [facility] CM/SW Contact  Lendia Dais, [place] Phone Number: 05/09/2024, 2:44 PM  Clinical Narrative:  CSW spoke to pt at bedside about PT recs of STR at [facility]. Pt was agreeable and has a preference of [facility], [facility], and [facility].  CSW asked for permission to send out referrals to other facilities if those three are not able to offer a bed. Pt was agreeable and CSW left [email or web address] list at bedside.   Referrals have been sent in the HUB and offers are pending.  CSW will continue to monitor.                      Expected Discharge Plan and Services                                               Social Drivers of Health (SDOH) Interventions SDOH Screenings   Food Insecurity: No Food Insecurity (05/08/2024)  Housing: Low Risk  (05/08/2024)  Transportation Needs: No Transportation Needs (05/08/2024)  Utilities: Not At Risk (05/08/2024)  Alcohol Screen: Low Risk  (03/03/2021)  Depression (PHQ2-9): High Risk (03/03/2021)  Financial Resource Strain: Low Risk (12/01/2023)   Received from [facility]  Physical Activity: Inactive (12/01/2023)   Received from [facility]  Social Connections: Moderately Integrated (05/08/2024)  Stress: No Stress Concern Present (12/01/2023)   Received from [facility]  Tobacco Use: Medium Risk (05/08/2024)  Health Literacy: Adequate Health Literacy (12/01/2023)   [facility]    Readmission Risk Interventions    03/07/2024    4:29 PM 11/01/2022    9:36 AM  Readmission Risk Prevention Plan  Transportation Screening Complete Complete  PCP or Specialist Appt within 3-5 Days  Complete  HRI or Home Care Consult Complete Complete  Social Work Consult for Recovery Care Planning/Counseling Complete  Complete  Palliative Care Screening Not Applicable Not Applicable  Medication Review Oceanographer) Complete Complete

## 2024-05-10 DIAGNOSIS — Z992 Dependence on renal dialysis: Secondary | ICD-10-CM | POA: Diagnosis not present

## 2024-05-10 DIAGNOSIS — N186 End stage renal disease: Secondary | ICD-10-CM | POA: Diagnosis not present

## 2024-05-10 DIAGNOSIS — E875 Hyperkalemia: Secondary | ICD-10-CM | POA: Diagnosis not present

## 2024-05-10 DIAGNOSIS — D631 Anemia in chronic kidney disease: Secondary | ICD-10-CM | POA: Diagnosis not present

## 2024-05-10 LAB — CBC
HCT: 31.4 % — ABNORMAL LOW (ref 36.0–46.0)
Hemoglobin: 9.8 g/dL — ABNORMAL LOW (ref 12.0–15.0)
MCH: 26.5 pg (ref 26.0–34.0)
MCHC: 31.2 g/dL (ref 30.0–36.0)
MCV: 84.9 fL (ref 80.0–100.0)
Platelets: 150 K/uL (ref 150–400)
RBC: 3.7 MIL/uL — ABNORMAL LOW (ref 3.87–5.11)
RDW: 15.3 % (ref 11.5–15.5)
WBC: 5.2 K/uL (ref 4.0–10.5)
nRBC: 0 % (ref 0.0–0.2)

## 2024-05-10 LAB — RENAL FUNCTION PANEL
Albumin: 3.3 g/dL — ABNORMAL LOW (ref 3.5–5.0)
Anion gap: 17 — ABNORMAL HIGH (ref 5–15)
BUN: 35 mg/dL — ABNORMAL HIGH (ref 8–23)
CO2: 24 mmol/L (ref 22–32)
Calcium: 8 mg/dL — ABNORMAL LOW (ref 8.9–10.3)
Chloride: 93 mmol/L — ABNORMAL LOW (ref 98–111)
Creatinine, Ser: 5.9 mg/dL — ABNORMAL HIGH (ref 0.44–1.00)
GFR, Estimated: 7 mL/min — ABNORMAL LOW (ref >60)
Glucose, Bld: 128 mg/dL — ABNORMAL HIGH (ref 70–99)
Phosphorus: 5.1 mg/dL — ABNORMAL HIGH (ref 2.5–4.6)
Potassium: 3.9 mmol/L (ref 3.5–5.1)
Sodium: 134 mmol/L — ABNORMAL LOW (ref 135–145)

## 2024-05-10 MED ORDER — CHLORHEXIDINE GLUCONATE CLOTH 2 % EX PADS: 6.0000 | MEDICATED_PAD | Freq: Every day | CUTANEOUS | Status: DC

## 2024-05-10 MED ORDER — LOPERAMIDE HCL 2 MG PO CAPS
2.0000 mg | ORAL_CAPSULE | ORAL | Status: DC | PRN
Start: 2024-05-10 — End: 2024-05-12
  Administered 2024-05-11 (×2): 2 mg via ORAL
  Filled 2024-05-10 (×2): qty 1

## 2024-05-10 MED ORDER — GUAIFENESIN ER 600 MG PO TB12
600.0000 mg | ORAL_TABLET | Freq: Two times a day (BID) | ORAL | Status: DC | PRN
  Administered 2024-05-10: 600 mg via ORAL
  Filled 2024-05-10: qty 1

## 2024-05-10 NOTE — Progress Notes (Signed)
 Mobility Specialist Progress Note:  Nurse requested Mobility Specialist to perform oxygen  saturation test with pt which includes removing pt from oxygen  both at rest and while ambulating.  Below are the results from that testing.     Patient Saturations on Room Air at Rest = spO2 96%  Patient Saturations on Room Air while Ambulating = sp02 91% .  Patient Saturations on 0 Liters of oxygen  while Ambulating = sp02 91%  At end of testing pt left in room on 0 Liters of oxygen .  Reported results to nurse.    Lavanda Pollack Mobility Specialist  Please contact via [employer] or  Rehab Office [phone]

## 2024-05-10 NOTE — Progress Notes (Signed)
 Mobility Specialist Progress Note:    05/10/24 1235  Mobility  Activity Ambulated with assistance (In room)  Level of Assistance Contact guard assist, steadying assist  Assistive Device Front wheel walker  Distance Ambulated (ft) 15 ft  Activity Response Tolerated well  Mobility Referral Yes  Mobility visit 1 Mobility  Mobility Specialist Start Time (ACUTE ONLY) 1112  Mobility Specialist Stop Time (ACUTE ONLY) 1134  Mobility Specialist Time Calculation (min) (ACUTE ONLY) 22 min   Received pt in chair and hesitant, but agreeable to mobility. Pt required MinG for safety. No c/o. VSS throughout. Pt ambulated to BR. Pt left in chair. Personal belongings and call light within reach. All needs met.  Lavanda Pollack Mobility Specialist  Please contact via [employer] or  Rehab Office [phone]

## 2024-05-10 NOTE — Progress Notes (Signed)
  [facility] Progress Note   Subjective:    Seen in room Sitting up on the couch No c/o's, states they might have found / decided on a SNF facility for rehab  Objective Vitals:   05/10/24 0530 05/10/24 0600 05/10/24 0610 05/10/24 0815  BP: (!) 130/42 (!) 148/53 133/71 (!) 146/54  Pulse: 85 83 86 86  Resp: 10 13 13 17   Temp:   98 F (36.7 C) 98.4 F (36.9 C)  TempSrc:   Oral Oral  SpO2: 99% 98% 93% 93%  Weight:   105.5 kg   Height:       Physical Exam General: Alert female in NAD Heart: RRR, no murmurs, rubs or gallops Lungs: + expiratory wheeze RLL  Abdomen: Soft, non-distended, +BS Extremities: no edema b/l lower extremities Dialysis Access: AVF + t/b  Dialysis Orders:  TTS G-O  4.5h  B450   102kg  2K bath  AVF    Heparin  3000 Skips HD somewhat frequently Usually comes off 1-3kg over Last Op HD 10/21, post wt 105kg    Assessment/Plan: Hyperkalemia: severe w/ K+ 7.1, no severe EKG changes. Resolved post HD ESRD: on HD TTS. SP HD HD Monday and Tuesday (overnight). Next HD tomorrow.   HTN: bp's good, on home meds per pmd.  Volume: 3kg over today, max UF w/ HD tomorrow.  Anemia of esrd: Hb 9- 10 here. Follow.  Dispo: possible SNF per PT  Myer Fret  MD  [facility] 05/10/2024, 12:22 PM  Recent Labs  Lab 05/09/24 0439 05/10/24 0802  HGB 9.3* 9.8*  ALBUMIN  3.0* 3.3*  CALCIUM  8.2* 8.0*  PHOS 7.8* 5.1*  CREATININE 8.19* 5.90*  K 5.0 3.9    Inpatient medications:  acetaminophen   1,000 mg Oral Q6H   albuterol   2.5 mg Nebulization Q6H   amLODipine   10 mg Oral See admin instructions   aspirin  EC  81 mg Oral Daily   atorvastatin   80 mg Oral QPM   carvedilol   12.5 mg Oral BID WC   cinacalcet   30 mg Oral Q breakfast   fluticasone  furoate-vilanterol  1 puff Inhalation Daily   gabapentin   200 mg Oral Q T,Th,Sa-HD   heparin   5,000 Units Subcutaneous Q8H   hydrOXYzine   25 mg Oral BID   pantoprazole   40 mg Oral QAC breakfast   predniSONE   40 mg Oral Q  breakfast   pregabalin   100 mg Oral BID   senna  1 tablet Oral BID   sucroferric oxyhydroxide  500 mg Oral TID WC    fluticasone , oxyCODONE , polyethylene glycol powder, tiZANidine

## 2024-05-10 NOTE — TOC Progression Note (Signed)
 Transition of Care [facility]) - Progression Note    Patient Details  Name: [name] MRN: [id number] Date of Birth: 1955-10-07  [facility] CM/SW Contact  Lendia Dais, [place] Phone Number: 05/10/2024, 2:00 PM  Clinical Narrative: Pt has chosen a bed at [facility]. Pt needs insurance auth and website for [facility] is down at the moment. CSW will continue to monitor for the website to resume function.   CSW will continue to follow.                      Expected Discharge Plan and Services                                               Social Drivers of Health (SDOH) Interventions SDOH Screenings   Food Insecurity: No Food Insecurity (05/08/2024)  Housing: Low Risk  (05/08/2024)  Transportation Needs: No Transportation Needs (05/08/2024)  Utilities: Not At Risk (05/08/2024)  Alcohol Screen: Low Risk  (03/03/2021)  Depression (PHQ2-9): High Risk (03/03/2021)  Financial Resource Strain: Low Risk (12/01/2023)   Received from [facility]  Physical Activity: Inactive (12/01/2023)   Received from [facility]  Social Connections: Moderately Integrated (05/08/2024)  Stress: No Stress Concern Present (12/01/2023)   Received from [facility]  Tobacco Use: Medium Risk (05/08/2024)  Health Literacy: Adequate Health Literacy (12/01/2023)   Received from [facility]    Readmission Risk Interventions    03/07/2024    4:29 PM 11/01/2022    9:36 AM  Readmission Risk Prevention Plan  Transportation Screening Complete Complete  PCP or Specialist Appt within 3-5 Days  Complete  HRI or Home Care Consult Complete Complete  Social Work Consult for Recovery Care Planning/Counseling Complete Complete  Palliative Care Screening Not Applicable Not Applicable  Medication Review Oceanographer) Complete Complete

## 2024-05-10 NOTE — Progress Notes (Signed)
 Received patient in bed to unit.  Alert and oriented.  Informed consent signed and in chart.   TX duration: 3  Patient tolerated well.  Transported back to the room  Alert, without acute distress.  Hand-off given to patient's nurse. Shift RN  Access used: AVG Access issues: None  Total UF removed: 3000 Medication(s) given: None Post HD VS: [id number] B/P133/71 Post HD weight: 105.5kg  Neville Seip, RN [facility] Dialysis Unit   05/10/24 0610  Vitals  Temp 98 F (36.7 C)  Temp Source Oral  BP 133/71  MAP (mmHg) 80  BP Location Right Arm  BP Method Automatic  Patient Position (if appropriate) Lying  Pulse Rate 86  Pulse Rate Source Monitor  ECG Heart Rate 87  Resp 13  Weight 105.5 kg  Type of Weight Post-Dialysis  Oxygen  Therapy  SpO2 93 %  O2 Device Room Air  Patient Activity (if Appropriate) In bed  Pulse Oximetry Type Continuous  During Treatment Monitoring  Blood Flow Rate (mL/min) 0 mL/min  Arterial Pressure (mmHg) -36.96 mmHg  Venous Pressure (mmHg) 55.15 mmHg  TMP (mmHg) 24.44 mmHg  Ultrafiltration Rate (mL/min) 1241 mL/min  Dialysate Flow Rate (mL/min) 300 ml/min  Dialysate Potassium Concentration 2  Dialysate Calcium  Concentration 2.5  Duration of HD Treatment -hour(s) 3 hour(s)  Cumulative Fluid Removed (mL) per Treatment  3000.23  HD Safety Checks Performed Yes  Intra-Hemodialysis Comments Tx completed;Tolerated well  Post Treatment  Dialyzer Clearance Clear  Liters Processed 72  Fluid Removed (mL) 3000 mL  Tolerated HD Treatment Yes  AVG/AVF Arterial Site Held (minutes) 10 minutes  AVG/AVF Venous Site Held (minutes) 10 minutes  Fistula / Graft Left Upper arm Arteriovenous fistula  Placement Date/Time: 07/24/22 0745   Placed prior to admission: Yes  Orientation: Left  Access Location: Upper arm  Access Type: Arteriovenous fistula  Site Condition No complications  Fistula / Graft Assessment Present;Thrill;Bruit  Status  Deaccessed;Flushed;Patent  Needle Size 15G  Drainage Description None

## 2024-05-10 NOTE — Progress Notes (Signed)
 HD#0 SUBJECTIVE:  Patient Summary: [name] is a 68 y.o. with a pertinent PMH of ESRD on HD TTS, moderate to severe AS, HFpEF, chronic pain due to knee OA, neuropathic pain, anemia of chronic disease, T2DM, HTN, HLD, asthma, obesity, who presented to the ED for difficulty breathing and admitted for hyperkalemia in the setting of missed dialysis sessions.  Overnight Events: None  Interim History: [name] feels better today with no shortness of breath when lying down. She denies chest pain. Her pain is also better controlled this morning as she had just received pain medication.  Seen by nephrology who planned for an extra HD session yesterday, although the patient was unable to be scheduled until overnight for scheduling issues. Dry weight is reportedly 102 kgs. Weight after dialysis 105.5 kgs  PT and OT recommend SNF  Social work has sent out referrals for SNF placement  OBJECTIVE:  Vital Signs: Vitals:   05/10/24 0530 05/10/24 0600 05/10/24 0610 05/10/24 0815  BP: (!) 130/42 (!) 148/53 133/71 (!) 146/54  Pulse: 85 83 86 86  Resp: 10 13 13 17   Temp:   98 F (36.7 C) 98.4 F (36.9 C)  TempSrc:   Oral Oral  SpO2: 99% 98% 93% 93%  Weight:   105.5 kg   Height:       Supplemental O2: Room Air SpO2: 93 %  Filed Weights   05/09/24 0500 05/10/24 0245 05/10/24 0610  Weight: 108.5 kg 108.5 kg 105.5 kg     Intake/Output Summary (Last 24 hours) at 05/10/2024 0853 Last data filed at 05/10/2024 0610 Gross per 24 hour  Intake 240 ml  Output 3000 ml  Net -2760 ml   Net IO Since Admission: -2,771.81 mL [05/10/24 0853]  Physical Exam: Physical Exam Constitutional:      General: She is not in acute distress.    Appearance: She is not ill-appearing.  Cardiovascular:     Rate and Rhythm: Normal rate and regular rhythm.  Pulmonary:     Effort: Pulmonary effort is normal. No accessory muscle usage.     Breath sounds: Normal breath sounds. No decreased breath sounds,  wheezing, rhonchi or rales.  Abdominal:     Palpations: Abdomen is soft. There is no mass.     Tenderness: There is no abdominal tenderness. There is no guarding.  Musculoskeletal:     Right lower leg: No edema.     Left lower leg: No edema.  Neurological:     Mental Status: She is alert.      Patient Lines/Drains/Airways Status     Active Line/Drains/Airways     Name Placement date Placement time Site Days   Peripheral IV 05/08/24 20 G 2.5 Anterior;Right;Upper Arm 05/08/24  0910  Arm  1   Fistula / Graft Left Upper arm Arteriovenous fistula 07/24/22  0745  Upper arm  655            Pertinent labs and imaging:      Latest Ref Rng & Units 05/10/2024    8:02 AM 05/09/2024    4:39 AM 05/08/2024    7:59 AM  CBC  WBC 4.0 - 10.5 K/uL 5.2  4.9  6.2   Hemoglobin 12.0 - 15.0 g/dL 9.8  9.3  8.7   Hematocrit 36.0 - 46.0 % 31.4  30.0  28.8   Platelets 150 - 400 K/uL 150  135  150        Latest Ref Rng & Units 05/10/2024  8:02 AM 05/09/2024    4:39 AM 05/08/2024    7:46 PM  CMP  Glucose 70 - 99 mg/dL 871  80  840   BUN 8 - 23 mg/dL 35  51  44   Creatinine 0.44 - 1.00 mg/dL 4.09  1.80  2.59   Sodium 135 - 145 mmol/L 134  138  137   Potassium 3.5 - 5.1 mmol/L 3.9  5.0  4.4   Chloride 98 - 111 mmol/L 93  98  95   CO2 22 - 32 mmol/L 24  23  24    Calcium  8.9 - 10.3 mg/dL 8.0  8.2  8.2     No results found.   ASSESSMENT/PLAN:  Assessment: Principal Problem:   Hyperkalemia Active Problems:   ESRD on hemodialysis (HCC)   Volume overload  [name] is a 68 y.o. with a pertinent PMH of ESRD on HD TTS, moderate to severe AS, HFpEF, chronic pain due to knee OA, neuropathic pain, anemia of chronic disease, T2DM, HTN, HLD, asthma, obesity, who presented to the ED for difficulty breathing and admitted for hyperkalemia and respiratory distress, in the setting of missed dialysis sessions. She is medically stable for discharge.  Plan: Respiratory distress  (Improved) Volume overload Asthma No hypoxia, wheezing, or crackles on my examination. Given missed HD sessions, suspect volume overload driving increase work of breathing. She had spirometry in 2019 with showed moderate to sever restriction with no evidence of obstruction, which makes COPD less likely, although she may have had well-controlled asthma at this time. Given her desaturations with ambulation and intermittent wheezing we will also treat for potential asthma exacerbation. She is also still 3 kgs above her dry weight. Suspect that some of her respiratory distress is due to deconditioning due to her limited mobility from her chronic pain. - Continue Breo Ellipta  - Duonebs 2.5mg  q6hr scheduled -Prednisone  40mg  daily for 5 days (last day 11/1) - Flonase  PRN - SPO2 89 - 93% -PT have seen the patient and recommend SNF. - Ambulate with pulse oximetry   ESRD on HD TTS Metabolic bone disease Still makes urine. 3kgs above dry weight after dialysis. RFP improved after dialysis. - HD while inpatient - HD navigator aware - Strict I/O - Avoid nephrotoxic drugs - Restarted on PTA meds -Velphoro  500mg  TID   Hyperkalemia (Resolved) Patient with ESRD on HD missing sessions for family tragedy.  S/p hyperkalemia protocol. No EKG changes noted on EKG. K improved from 7.1 on admission to 3.9 this morning. - Appreciate nephrology's assistance with HD - Daily RFP - Cardiac telemetry   Pneumonia (Resolved) Diagnosed prior to admission. Finished a 5 day course of CAP treatment with 4 days of Levaquin  and one day of Ceftriaxone  and Azithromycin  given mildly prolonged QTC and hyperkalemia. - Continue Duonebs as above  Troponin elevation (Resolved) Suspect demand ischemia in setting of current stressors and volume overload. Troponins flat at 21, 23 and 20.   CAD HFpEF Moderate to severe AS Last TTE in 02/2024 with preserved EF and moderate to severe AS.  - Restarted on home Carvedilol  BID and  amlodipine  - On ASA and Lipitor  80 mg daily - Holding Torsemide  on dialysis days, restart today  Anemia of ESRD Normocytic anemia Hgb 8.7 on admission. Improved from ~ 1 month ago. Last ESA administered 04/11/2024. Last IV iron  infusion 01/2024. Improved to 9.8 today.  - Labs with outpatient HD - IV iron  infusion per nephrology - Goal Hgb >7; transfuse as needed  Chronic pain management Severe OA of bilateral knees Continue home medications - Oxycodone  10 mg TID PRN (PTA) - Lyrica   - Gabapentin  200mg  on dialysis days -Tizanidine  4mg .   Emotional distress Anxiety Recent traumatic loss of brother-in law. Patient requests prayers - Consult to spiritual team - Atarax  PRN   GERD - Protonix  daily  Best Practice: Diet: Renal diet VTE: heparin  injection 5,000 Units Start: 05/08/24 1400 SCDs Start: 05/08/24 1048 Code: Full  Disposition planning: Therapy Recs: SNF DISPO: Anticipated discharge today or tomorrow to Skilled nursing facility pending SNF placement.  Signature:  Melvenia Napoleon [name]  8:53 AM, 05/10/2024  On Call pager [phone]

## 2024-05-10 NOTE — Plan of Care (Signed)
  Problem: Health Behavior/Discharge Planning: Goal: Ability to manage health-related needs will improve Outcome: Progressing   Problem: Health Behavior/Discharge Planning: Goal: Ability to manage health-related needs will improve Outcome: Progressing   Problem: Clinical Measurements: Goal: Will remain free from infection Outcome: Progressing

## 2024-05-11 DIAGNOSIS — Z992 Dependence on renal dialysis: Secondary | ICD-10-CM | POA: Diagnosis not present

## 2024-05-11 DIAGNOSIS — E875 Hyperkalemia: Secondary | ICD-10-CM | POA: Diagnosis not present

## 2024-05-11 DIAGNOSIS — Z79891 Long term (current) use of opiate analgesic: Secondary | ICD-10-CM

## 2024-05-11 DIAGNOSIS — Z7982 Long term (current) use of aspirin: Secondary | ICD-10-CM

## 2024-05-11 DIAGNOSIS — J45901 Unspecified asthma with (acute) exacerbation: Secondary | ICD-10-CM

## 2024-05-11 DIAGNOSIS — I5030 Unspecified diastolic (congestive) heart failure: Secondary | ICD-10-CM

## 2024-05-11 DIAGNOSIS — G8929 Other chronic pain: Secondary | ICD-10-CM

## 2024-05-11 DIAGNOSIS — Z8701 Personal history of pneumonia (recurrent): Secondary | ICD-10-CM | POA: Diagnosis not present

## 2024-05-11 DIAGNOSIS — M170 Bilateral primary osteoarthritis of knee: Secondary | ICD-10-CM

## 2024-05-11 DIAGNOSIS — N186 End stage renal disease: Secondary | ICD-10-CM | POA: Diagnosis not present

## 2024-05-11 LAB — CBC
HCT: 29.4 % — ABNORMAL LOW (ref 36.0–46.0)
Hemoglobin: 9.4 g/dL — ABNORMAL LOW (ref 12.0–15.0)
MCH: 27.2 pg (ref 26.0–34.0)
MCHC: 32 g/dL (ref 30.0–36.0)
MCV: 85.2 fL (ref 80.0–100.0)
Platelets: 180 K/uL (ref 150–400)
RBC: 3.45 MIL/uL — ABNORMAL LOW (ref 3.87–5.11)
RDW: 15.5 % (ref 11.5–15.5)
WBC: 6.7 K/uL (ref 4.0–10.5)
nRBC: 0 % (ref 0.0–0.2)

## 2024-05-11 LAB — RENAL FUNCTION PANEL
Albumin: 3.5 g/dL (ref 3.5–5.0)
Anion gap: 20 — ABNORMAL HIGH (ref 5–15)
BUN: 66 mg/dL — ABNORMAL HIGH (ref 8–23)
CO2: 25 mmol/L (ref 22–32)
Calcium: 8.1 mg/dL — ABNORMAL LOW (ref 8.9–10.3)
Chloride: 91 mmol/L — ABNORMAL LOW (ref 98–111)
Creatinine, Ser: 7.81 mg/dL — ABNORMAL HIGH (ref 0.44–1.00)
GFR, Estimated: 5 mL/min — ABNORMAL LOW (ref >60)
Glucose, Bld: 218 mg/dL — ABNORMAL HIGH (ref 70–99)
Phosphorus: 7.4 mg/dL — ABNORMAL HIGH (ref 2.5–4.6)
Potassium: 5.2 mmol/L — ABNORMAL HIGH (ref 3.5–5.1)
Sodium: 136 mmol/L (ref 135–145)

## 2024-05-11 LAB — MAGNESIUM: Magnesium: 2 mg/dL (ref 1.7–2.4)

## 2024-05-11 LAB — GLUCOSE, CAPILLARY: Glucose-Capillary: 179 mg/dL — ABNORMAL HIGH (ref 70–99)

## 2024-05-11 MED ORDER — PENTAFLUOROPROP-TETRAFLUOROETH EX AERO: 1.0000 | INHALATION_SPRAY | CUTANEOUS | Status: DC | PRN

## 2024-05-11 MED ORDER — SODIUM ZIRCONIUM CYCLOSILICATE 10 G PO PACK
10.0000 g | PACK | Freq: Once | ORAL | Status: AC
  Administered 2024-05-11: 10 g via ORAL
  Filled 2024-05-11: qty 1

## 2024-05-11 MED ORDER — NEPRO/CARBSTEADY PO LIQD: 237.0000 mL | ORAL | Status: DC | PRN

## 2024-05-11 MED ORDER — LIDOCAINE-PRILOCAINE 2.5-2.5 % EX CREA: 1.0000 | TOPICAL_CREAM | CUTANEOUS | Status: DC | PRN

## 2024-05-11 MED ORDER — ANTICOAGULANT SODIUM CITRATE 4% (200MG/5ML) IV SOLN: 5.0000 mL | Status: DC | PRN

## 2024-05-11 MED ORDER — HEPARIN SODIUM (PORCINE) 1000 UNIT/ML DIALYSIS
3000.0000 [IU] | Freq: Once | INTRAMUSCULAR | Status: AC
  Administered 2024-05-11: 3000 [IU] via INTRAVENOUS_CENTRAL

## 2024-05-11 MED ORDER — HEPARIN SODIUM (PORCINE) 1000 UNIT/ML DIALYSIS: 1000.0000 [IU] | INTRAMUSCULAR | Status: DC | PRN

## 2024-05-11 MED ORDER — ALTEPLASE 2 MG IJ SOLR: 2.0000 mg | Freq: Once | INTRAMUSCULAR | Status: DC | PRN

## 2024-05-11 MED ORDER — HEPARIN SODIUM (PORCINE) 1000 UNIT/ML IJ SOLN
INTRAMUSCULAR | Status: AC
  Filled 2024-05-11: qty 3

## 2024-05-11 MED ORDER — LIDOCAINE HCL (PF) 1 % IJ SOLN: 5.0000 mL | INTRAMUSCULAR | Status: DC | PRN

## 2024-05-11 NOTE — Plan of Care (Signed)
  Problem: Health Behavior/Discharge Planning: Goal: Ability to manage health-related needs will improve Outcome: Progressing   Problem: Health Behavior/Discharge Planning: Goal: Ability to manage health-related needs will improve Outcome: Progressing   Problem: Clinical Measurements: Goal: Will remain free from infection Outcome: Progressing

## 2024-05-11 NOTE — Progress Notes (Signed)
 PT Cancellation Note  Patient Details Name: [name] MRN: [id number] DOB: February 12, 1956   Cancelled Treatment:    Reason Eval/Treat Not Completed: Patient at procedure or test/unavailable. Transport in room to take pt to HD.    Erven Sari Shaker 05/11/2024, 7:59 AM

## 2024-05-11 NOTE — Progress Notes (Addendum)
 HD#0 SUBJECTIVE:  Patient Summary: [name] is a 68 y.o. with a pertinent PMH of ESRD on HD TTS, moderate to severe AS, HFpEF, chronic pain due to knee OA, neuropathic pain, anemia of chronic disease, T2DM, HTN, HLD, asthma, obesity, who presented to the ED for difficulty breathing and admitted for hyperkalemia in the setting of missed dialysis sessions.  Overnight Events: None  Interim History: [name] feels well today. She denies shortness of breath. Her last BM was yesterday. She is tolerating a diet. Her pain is well controlled.  Nephrology planning for dialysis today.  Social work working for authorization.  OBJECTIVE:  Vital Signs: Vitals:   05/10/24 2008 05/11/24 0508 05/11/24 0701 05/11/24 0820  BP: 133/62 (!) 142/54    Pulse: 79 76    Resp: 18 18  16   Temp: 97.8 F (36.6 C) 98.6 F (37 C)  98.3 F (36.8 C)  TempSrc: Oral Oral  Oral  SpO2: 96% 98%    Weight:   107.4 kg   Height:       Supplemental O2: Room Air SpO2: 98 %  Filed Weights   05/10/24 0245 05/10/24 0610 05/11/24 0701  Weight: 108.5 kg 105.5 kg 107.4 kg     Intake/Output Summary (Last 24 hours) at 05/11/2024 9177 Last data filed at 05/11/2024 0509 Gross per 24 hour  Intake --  Output 0 ml  Net 0 ml   Net IO Since Admission: -2,771.81 mL [05/11/24 0822]  Physical Exam: Physical Exam Constitutional:      General: She is not in acute distress.    Appearance: She is not ill-appearing.  Cardiovascular:     Rate and Rhythm: Normal rate and regular rhythm.  Pulmonary:     Effort: Pulmonary effort is normal. No accessory muscle usage.     Breath sounds: Normal breath sounds. No decreased breath sounds, wheezing, rhonchi or rales.  Abdominal:     Palpations: Abdomen is soft. There is no mass.     Tenderness: There is no abdominal tenderness. There is no guarding.  Musculoskeletal:     Comments: Trace edema  Neurological:     Mental Status: She is alert.      Patient  Lines/Drains/Airways Status     Active Line/Drains/Airways     Name Placement date Placement time Site Days   Peripheral IV 05/08/24 20 G 2.5 Anterior;Right;Upper Arm 05/08/24  0910  Arm  1   Fistula / Graft Left Upper arm Arteriovenous fistula 07/24/22  0745  Upper arm  655            Pertinent labs and imaging:      Latest Ref Rng & Units 05/10/2024    8:02 AM 05/09/2024    4:39 AM 05/08/2024    7:59 AM  CBC  WBC 4.0 - 10.5 K/uL 5.2  4.9  6.2   Hemoglobin 12.0 - 15.0 g/dL 9.8  9.3  8.7   Hematocrit 36.0 - 46.0 % 31.4  30.0  28.8   Platelets 150 - 400 K/uL 150  135  150        Latest Ref Rng & Units 05/11/2024    3:20 AM 05/10/2024    8:02 AM 05/09/2024    4:39 AM  CMP  Glucose 70 - 99 mg/dL 781  871  80   BUN 8 - 23 mg/dL 66  35  51   Creatinine 0.44 - 1.00 mg/dL 2.18  4.09  1.80   Sodium 135 - 145  mmol/L 136  134  138   Potassium 3.5 - 5.1 mmol/L 5.2  3.9  5.0   Chloride 98 - 111 mmol/L 91  93  98   CO2 22 - 32 mmol/L 25  24  23    Calcium  8.9 - 10.3 mg/dL 8.1  8.0  8.2     No results found.   ASSESSMENT/PLAN:  Assessment: Principal Problem:   Hyperkalemia Active Problems:   ESRD on hemodialysis (HCC)   Volume overload  [name] is a 68 y.o. with a pertinent PMH of ESRD on HD TTS, moderate to severe AS, HFpEF, chronic pain due to knee OA, neuropathic pain, anemia of chronic disease, T2DM, HTN, HLD, asthma, obesity, who presented to the ED for difficulty breathing and admitted for hyperkalemia and respiratory distress, in the setting of missed dialysis sessions. She is medically stable for discharge.  Plan: Respiratory distress (Improved) Volume overload Asthma Presented with respiratory distress. Chest x-Rammel showed stable scarring or atelectasis at the right lung base. Given missed HD sessions, suspect volume overload driving increase work of breathing. She had spirometry in 2019 with showed moderate to sever restriction with no evidence of  obstruction, which makes COPD less likely, although she may have had well-controlled asthma at this time. Given her desaturations with ambulation and intermittent wheezing we will also treat for potential asthma exacerbation. She is also still 3 kgs above her dry weight. Suspect that some of her respiratory distress is due to deconditioning due to her limited mobility from her chronic pain. Her symptoms have improved since admission and she was able to ambulate without desaturation yesterday. - Continue Breo Ellipta  - Duonebs 2.5mg  q6hr scheduled -Prednisone  40mg  daily for 5 days (last day 11/1) - Flonase  PRN - SPO2 89 - 93% - PT have seen the patient and recommend SNF.   ESRD on HD TTS Metabolic bone disease Still makes urine. 3kgs above dry weight after dialysis. RFP improved after dialysis. - HD while inpatient, planning for today. - HD navigator aware - Strict I/O - Avoid nephrotoxic drugs - Restarted on PTA meds -Velphoro  500mg  TID   Hyperkalemia (Resolved) Patient with ESRD on HD missing sessions for family tragedy.  S/p hyperkalemia protocol. No EKG changes noted on EKG. K 5.2 this morning. - Appreciate nephrology's assistance with HD, will plan for dialysis today - 10g Lokelma  this morning. - Daily RFP - Cardiac telemetry   Pneumonia (Resolved) Diagnosed prior to admission. Finished a 5 day course of CAP treatment with 4 days of Levaquin  and one day of Ceftriaxone  and Azithromycin  given mildly prolonged QTC and hyperkalemia. - Continue Duonebs as above  Troponin elevation (Resolved) Suspect demand ischemia in setting of current stressors and volume overload. Troponins flat at 21, 23 and 20.   CAD HFpEF Moderate to severe AS Last TTE in 02/2024 with preserved EF and moderate to severe AS.  - Restarted on home Carvedilol  BID and amlodipine  - On ASA and Lipitor  80 mg daily - Holding Torsemide  on dialysis days  Anemia of ESRD Normocytic anemia Hgb 8.7 on admission.  Improved from ~ 1 month ago. Last ESA administered 04/11/2024. Last IV iron  infusion 01/2024. Improved to 9.8 today.  - Labs with outpatient HD - IV iron  infusion per nephrology - Goal Hgb >7; transfuse as needed   Chronic pain management Severe OA of bilateral knees Continue home medications - Oxycodone  10 mg TID PRN (PTA) - Lyrica   - Gabapentin  200mg  on dialysis days -Tizanidine  4mg .   Emotional distress  Anxiety Recent traumatic loss of brother-in law. Patient requests prayers - Consult to spiritual team - Atarax  PRN   GERD - Protonix  daily  Best Practice: Diet: Renal diet VTE: heparin  injection 5,000 Units Start: 05/08/24 1400 SCDs Start: 05/08/24 1048 Code: Full  Disposition planning: Therapy Recs: SNF DISPO: Anticipated discharge today or tomorrow to Skilled nursing facility pending SNF placement.  Signature:  Melvenia Napoleon [facility]ol[facility]  8:22 AM, 05/11/2024  On Call pager [phone]

## 2024-05-11 NOTE — Progress Notes (Signed)
  [facility] Progress Note   Subjective:    Seen in HD unit No /co's today  Objective Vitals:   05/11/24 1030 05/11/24 1100 05/11/24 1213 05/11/24 1221  BP: 129/66 (!) 119/56 123/60 (!) 131/54  Pulse: 77 76 74 78  Resp: 18 20 13  (!) 23  Temp:    (!) 97.5 F (36.4 C)  TempSrc:      SpO2: 96% 97% 100% 100%  Weight:    106.1 kg  Height:       Physical Exam General: Alert female in NAD Heart: RRR, no murmurs, rubs or gallops Lungs: + expiratory wheeze RLL  Abdomen: Soft, non-distended, +BS Extremities: no edema b/l lower extremities Dialysis Access: AVF + t/b  Dialysis Orders:  TTS G-O  4.5h  B450   102kg  2K bath  AVF    Heparin  3000 Skips HD somewhat frequently Usually comes off 1-3kg over Last Op HD 10/21, post wt 105kg    Assessment/Plan: Hyperkalemia: severe w/ K+ 7.1, no severe EKG changes. Resolved post HD ESRD: on HD TTS. SP HD HD Monday and Tuesday here. Next HD today.   HTN: bp's good, on home meds per pmd.  Volume: 3-4 kg up, max UF w/ HD.  Anemia of esrd: Hb 9- 10 here. Follow.  Dispo: possible SNF d/c today or tomorrow  Myer Fret  MD  [facility] 05/11/2024, 12:27 PM  Recent Labs  Lab 05/10/24 0802 05/11/24 0320 05/11/24 0855  HGB 9.8*  --  9.4*  ALBUMIN  3.3* 3.5  --   CALCIUM  8.0* 8.1*  --   PHOS 5.1* 7.4*  --   CREATININE 5.90* 7.81*  --   K 3.9 5.2*  --     Inpatient medications:  acetaminophen   1,000 mg Oral Q6H   amLODipine   10 mg Oral See admin instructions   aspirin  EC  81 mg Oral Daily   atorvastatin   80 mg Oral QPM   carvedilol   12.5 mg Oral BID WC   Chlorhexidine  Gluconate Cloth  6 each Topical Q0600   cinacalcet   30 mg Oral Q breakfast   fluticasone  furoate-vilanterol  1 puff Inhalation Daily   gabapentin   200 mg Oral Q T,Th,Sa-HD   heparin   5,000 Units Subcutaneous Q8H   hydrOXYzine   25 mg Oral BID   pantoprazole   40 mg Oral QAC breakfast   predniSONE   40 mg Oral Q breakfast   pregabalin   100 mg Oral BID   senna   1 tablet Oral BID   sucroferric oxyhydroxide  500 mg Oral TID WC    anticoagulant sodium citrate      alteplase , anticoagulant sodium citrate , feeding supplement (NEPRO CARB STEADY), fluticasone , guaiFENesin , heparin , lidocaine  (PF), lidocaine -prilocaine , loperamide , oxyCODONE , pentafluoroprop-tetrafluoroeth, polyethylene glycol powder, tiZANidine

## 2024-05-11 NOTE — TOC Progression Note (Addendum)
 Transition of Care [facility]) - Progression Note    Patient Details  Name: [name] MRN: [id number] Date of Birth: May 18, 1956  [facility] CM/SW Contact  Lendia Dais, [place] Phone Number: 05/11/2024, 10:21 AM  Clinical Narrative:  CMA is starting auth through [employer]. Shara is pending.  1226 - Auth approved [employer] cert#[id number] 10/30 - 11/5.  1302 - CSW spoke to Tammy of [facility] who stated they can take the pt first thing tomorrow morning. Med necessity for complete  CSW will continue to monitor.                      Expected Discharge Plan and Services                                               Social Drivers of Health (SDOH) Interventions SDOH Screenings   Food Insecurity: No Food Insecurity (05/08/2024)  Housing: Low Risk  (05/08/2024)  Transportation Needs: No Transportation Needs (05/08/2024)  Utilities: Not At Risk (05/08/2024)  Alcohol Screen: Low Risk  (03/03/2021)  Depression (PHQ2-9): High Risk (03/03/2021)  Financial Resource Strain: Low Risk (12/01/2023)   Received from [facility]  Physical Activity: Inactive (12/01/2023)   Received from [facility]  Social Connections: Moderately Integrated (05/08/2024)  Stress: No Stress Concern Present (12/01/2023)   Received from [facility]  Tobacco Use: Medium Risk (05/08/2024)  Health Literacy: Adequate Health Literacy (12/01/2023)   Received from [facility]    Readmission Risk Interventions    03/07/2024    4:29 PM 11/01/2022    9:36 AM  Readmission Risk Prevention Plan  Transportation Screening Complete Complete  PCP or Specialist Appt within 3-5 Days  Complete  HRI or Home Care Consult Complete Complete  Social Work Consult for Recovery Care Planning/Counseling Complete Complete  Palliative Care Screening Not Applicable Not Applicable  Medication Review Oceanographer) Complete Complete

## 2024-05-11 NOTE — Progress Notes (Signed)
 Received hand off from floor nurse. Pt. Came in on bed, assisted by porter and a sitter. Pt. Is awake and responsive with no complaints. Consent signed and on chart. Pt. Given heparin  3000 units as ordered bolus. Seen by AP.  Pt. Started tx with no complaints  Treatment duration: 3.5 hours Tx removal:  Access used: Left Upper AV fistula Access issue: None  Pt. Complained of uneasiness on the chest with on and off pain. Put on 02 at 2LPM via nasal cannula.  Pt. Verbalized that pain was already gone.  Patient completed with no complaints. Pressure dressing applied. Report given to floor nurse. Transported pt.to her room.  Heather Lamping Rubi B. Vauda Salvucci, RN [facility]

## 2024-05-11 NOTE — Progress Notes (Signed)
   05/11/24 1221  Vitals  Temp (!) 97.5 F (36.4 C)  Pulse Rate 78  Resp (!) 23  BP (!) 131/54  SpO2 100 %  O2 Device Nasal Cannula  Weight 106.1 kg  Oxygen  Therapy  O2 Flow Rate (L/min) 2 L/min  Patient Activity (if Appropriate) In bed  Pulse Oximetry Type Continuous  Oximetry Probe Site Changed No  Post Treatment  Dialyzer Clearance Lightly streaked  Liters Processed 73.5  Fluid Removed (mL) 2900 mL  Tolerated HD Treatment Yes  Post-Hemodialysis Comments Pt. chest pain is relieve. Pt. doesnt have any complaints

## 2024-05-12 DIAGNOSIS — E875 Hyperkalemia: Secondary | ICD-10-CM | POA: Diagnosis not present

## 2024-05-12 DIAGNOSIS — Z8701 Personal history of pneumonia (recurrent): Secondary | ICD-10-CM | POA: Diagnosis not present

## 2024-05-12 DIAGNOSIS — J45901 Unspecified asthma with (acute) exacerbation: Secondary | ICD-10-CM | POA: Diagnosis not present

## 2024-05-12 DIAGNOSIS — G8929 Other chronic pain: Secondary | ICD-10-CM | POA: Diagnosis not present

## 2024-05-12 LAB — RENAL FUNCTION PANEL
Albumin: 3.1 g/dL — ABNORMAL LOW (ref 3.5–5.0)
Anion gap: 18 — ABNORMAL HIGH (ref 5–15)
BUN: 59 mg/dL — ABNORMAL HIGH (ref 8–23)
CO2: 22 mmol/L (ref 22–32)
Calcium: 7.7 mg/dL — ABNORMAL LOW (ref 8.9–10.3)
Chloride: 95 mmol/L — ABNORMAL LOW (ref 98–111)
Creatinine, Ser: 6.48 mg/dL — ABNORMAL HIGH (ref 0.44–1.00)
GFR, Estimated: 7 mL/min — ABNORMAL LOW (ref >60)
Glucose, Bld: 278 mg/dL — ABNORMAL HIGH (ref 70–99)
Phosphorus: 5.6 mg/dL — ABNORMAL HIGH (ref 2.5–4.6)
Potassium: 5.2 mmol/L — ABNORMAL HIGH (ref 3.5–5.1)
Sodium: 135 mmol/L (ref 135–145)

## 2024-05-12 LAB — CBC
HCT: 30.3 % — ABNORMAL LOW (ref 36.0–46.0)
Hemoglobin: 9.3 g/dL — ABNORMAL LOW (ref 12.0–15.0)
MCH: 26.8 pg (ref 26.0–34.0)
MCHC: 30.7 g/dL (ref 30.0–36.0)
MCV: 87.3 fL (ref 80.0–100.0)
Platelets: 156 K/uL (ref 150–400)
RBC: 3.47 MIL/uL — ABNORMAL LOW (ref 3.87–5.11)
RDW: 15.4 % (ref 11.5–15.5)
WBC: 5.8 K/uL (ref 4.0–10.5)
nRBC: 0 % (ref 0.0–0.2)

## 2024-05-12 LAB — GLUCOSE, CAPILLARY: Glucose-Capillary: 186 mg/dL — ABNORMAL HIGH (ref 70–99)

## 2024-05-12 MED ORDER — OXYCODONE HCL 10 MG PO TABS: 10.0000 mg | ORAL_TABLET | Freq: Three times a day (TID) | ORAL | 0 refills | Status: DC

## 2024-05-12 MED ORDER — PREDNISONE 20 MG PO TABS: 40.0000 mg | ORAL_TABLET | Freq: Every day | ORAL | Status: AC

## 2024-05-12 MED ORDER — SODIUM ZIRCONIUM CYCLOSILICATE 10 G PO PACK
10.0000 g | PACK | Freq: Once | ORAL | Status: AC
  Administered 2024-05-12: 10 g via ORAL
  Filled 2024-05-12: qty 1

## 2024-05-12 MED ORDER — VELPHORO 500 MG PO CHEW: CHEWABLE_TABLET | ORAL | Status: AC

## 2024-05-12 MED ORDER — LOKELMA 10 G PO PACK: 10.0000 g | PACK | Freq: Every day | ORAL | Status: AC

## 2024-05-12 MED ORDER — PREGABALIN 100 MG PO CAPS: 100.0000 mg | ORAL_CAPSULE | Freq: Two times a day (BID) | ORAL | 0 refills | Status: DC

## 2024-05-12 NOTE — TOC Transition Note (Signed)
 Transition of Care [facility]) - Discharge Note   Patient Details  Name: [name] MRN: [id number] Date of Birth: [birth year]  Transition of Care [facility]) CM/SW Contact:  Lendia Dais, LCSWA Phone Number: 05/12/2024, 9:08 AM   Clinical Narrative:  Pt discharging to [facility], RN report to [phone], RM 131A. PTAR scheduled for 11:30.  CSW called pt's daughter [name] and left a VM.  No further TOC needs.     Final next level of care: Skilled Nursing Facility Barriers to Discharge: Continued Medical Work up   Patient Goals and CMS Choice            Discharge Placement              Patient chooses bed at: Other - please specify in the comment section below: [facility]) Patient to be transferred to facility by: PTAR Name of family member notified: [name] Patient and family notified of of transfer: 05/12/24  Discharge Plan and Services Additional resources added to the After Visit Summary for                                       Social Drivers of Health (SDOH) Interventions SDOH Screenings   Food Insecurity: No Food Insecurity (05/08/2024)  Housing: Low Risk  (05/08/2024)  Transportation Needs: No Transportation Needs (05/08/2024)  Utilities: Not At Risk (05/08/2024)  Alcohol Screen: Low Risk  (03/03/2021)  Depression (PHQ2-9): High Risk (03/03/2021)  Financial Resource Strain: Low Risk (12/01/2023)   Received from [facility]  Physical Activity: Inactive (12/01/2023)   Received from [facility]  Social Connections: Moderately Integrated (05/08/2024)  Stress: No Stress Concern Present (12/01/2023)   Received from [facility]  Tobacco Use: Medium Risk (05/08/2024)  Health Literacy: Adequate Health Literacy (12/01/2023)   [facility]     Readmission Risk Interventions    03/07/2024    4:29 PM 11/01/2022    9:36 AM  Readmission Risk Prevention Plan   Transportation Screening Complete Complete  PCP or Specialist Appt within 3-5 Days  Complete  HRI or Home Care Consult Complete Complete  Social Work Consult for Recovery Care Planning/Counseling Complete Complete  Palliative Care Screening Not Applicable Not Applicable  Medication Review Oceanographer) Complete Complete

## 2024-05-12 NOTE — Progress Notes (Addendum)
 D/c noted. Contacted out-pt HD clinic, [facility], to inform that pt will d[facility] today and that snf will take her to appts, starting tmrrw. No further support needed at this time.   Lavanda Brennin [place] Dialysis Navigator [phone]

## 2024-05-12 NOTE — Progress Notes (Signed)
 Occupational Therapy Treatment Patient Details Name: [name] MRN: [id number] DOB: Nov 29, 1955 Today's Date: 05/12/2024   History of present illness [name] is a 68 y.o. female who presented to the ED for difficulty breathing, found to have PNA. PMH: ESRD on HD TTS, moderate to severe AS, HFpEF, chronic pain due to knee OA, neuropathic pain, anemia of chronic disease, T2DM, HTN, HLD, asthma, obesity   OT comments  Pt. Seen for skilled OT treatment session.  Pt. Seated eob upon arrival and had completed UB dressing and was actively working on LB ADLs. Pt. Able to don lotion seated eob by bringing RLE into/on bed by turning leg sideways in bed.  Utilized same best boy. For donning sock.  Reviewed fall prevention of not bending forward, pt. Verbalized understanding and cont. To demonstrate safe tech. Throughout.  Agree with d/c recommendations.  Cont. With acute OT POC.        If plan is discharge home, recommend the following:  A lot of help with walking and/or transfers;A lot of help with bathing/dressing/bathroom;Assistance with cooking/housework;Assist for transportation;Help with stairs or ramp for entrance   Equipment Recommendations       Recommendations for Other Services      Precautions / Restrictions Precautions Precautions: Fall Recall of Precautions/Restrictions: Intact       Mobility Bed Mobility               General bed mobility comments: seated eob beg./end of session    Transfers                         Balance                                           ADL either performed or assessed with clinical judgement   ADL Overall ADL's : Needs assistance/impaired                     Lower Body Dressing: Set up;Sitting/lateral leans Lower Body Dressing Details (indicate cue type and reason): pt. able to sit eob and turn to L/R bringing leg into the bed to reach towards foot for application of lotion and socks-increased  time but no lob noted                    Extremity/Trunk Assessment              Vision       Perception     Praxis     Communication Communication Communication: No apparent difficulties   Cognition Arousal: Alert Behavior During Therapy: WFL for tasks assessed/performed                                 Following commands: Intact        Cueing   Cueing Techniques: Verbal cues, Tactile cues  Exercises      Shoulder Instructions       General Comments      Pertinent Vitals/ Pain       Pain Assessment Pain Assessment: No/denies pain  Home Living  Prior Functioning/Environment              Frequency  Min 2X/week        Progress Toward Goals  OT Goals(current goals can now be found in the care plan section)  Progress towards OT goals: Progressing toward goals     Plan      Co-evaluation                 AM-PAC OT 6 Clicks Daily Activity     Outcome Measure   Help from another person eating meals?: None Help from another person taking care of personal grooming?: A Little Help from another person toileting, which includes using toliet, bedpan, or urinal?: A Little Help from another person bathing (including washing, rinsing, drying)?: A Lot Help from another person to put on and taking off regular upper body clothing?: A Little Help from another person to put on and taking off regular lower body clothing?: A Lot 6 Click Score: 17    End of Session    OT Visit Diagnosis: Unsteadiness on feet (R26.81);Other abnormalities of gait and mobility (R26.89);Muscle weakness (generalized) (M62.81);Pain Pain - part of body: Knee;Hand   Activity Tolerance Patient tolerated treatment well   Patient Left in chair   Nurse Communication          Time: 8962-8952 OT Time Calculation (min): 10 min  Charges: OT General Charges $OT Visit: 1 Visit OT  Treatments $Self Care/Home Management : 8-22 mins  Randall, COTA/L Acute Rehabilitation [phone]   CHRISTELLA Nest Lorraine-COTA/L 05/12/2024, 11:22 AM

## 2024-05-12 NOTE — Progress Notes (Signed)
 DISCHARGE NOTE HOME [facility] to be discharged Skilled nursing facility per MD order. Discussed prescriptions and follow up appointments with the patient. Prescriptions given to patient; medication list explained in detail. Patient verbalized understanding.  Skin clean, dry and intact without evidence of skin break down, no evidence of skin tears noted. IV catheter discontinued intact. Site without signs and symptoms of complications. Dressing and pressure applied. Pt denies pain at the site currently. No complaints noted.  Patient free of lines, drains, and wounds.   An After Visit Summary (AVS) was printed and given to the patient. Patient escorted via wheelchair, and discharged home via ptar  Doyal Sias, RN

## 2024-05-12 NOTE — Plan of Care (Signed)
  Problem: Health Behavior/Discharge Planning: Goal: Ability to manage health-related needs will improve Outcome: Progressing   Problem: Health Behavior/Discharge Planning: Goal: Ability to manage health-related needs will improve Outcome: Progressing   Problem: Clinical Measurements: Goal: Will remain free from infection Outcome: Progressing

## 2024-05-12 NOTE — Discharge Summary (Signed)
 Name: [name] MRN: [id number] DOB: [birth year] 68 y.o. PCP: No primary care provider on file.  Date of Admission: 05/08/2024  6:46 AM Date of Discharge: 10/31 Attending Physician: Dr. Jone Dauphin  Discharge Diagnosis: 1. Principal Problem:   Hyperkalemia Active Problems:   ESRD on hemodialysis (HCC)   Volume overload   Discharge Medications: Allergies as of 05/12/2024       Reactions   Shellfish Allergy Anaphylaxis, Swelling   Diazepam  Other (See Comments)   felt like out of body experience   Morphine  Itching, Other (See Comments)   Tolerated hydromorphone  on 07/2020 *will take along with Benadryl *        Medication List     STOP taking these medications    HYDROcodone  bit-homatropine 5-1.5 MG/5ML syrup Commonly known as: HYCODAN   levofloxacin  500 MG tablet Commonly known as: LEVAQUIN    sevelamer  carbonate 800 MG tablet Commonly known as: RENVELA        TAKE these medications    albuterol  108 (90 Base) MCG/ACT inhaler Commonly known as: VENTOLIN  HFA Inhale 2 puffs into the lungs every 4 (four) hours as needed for wheezing or shortness of breath.   amLODipine  10 MG tablet Commonly known as: NORVASC  Take 10 mg by mouth daily.   ammonium lactate 12 % lotion Commonly known as: LAC-HYDRIN Apply 1 Application topically daily as needed for dry skin.   aspirin  EC 81 MG tablet Take 1 tablet (81 mg total) by mouth daily.   atorvastatin  80 MG tablet Commonly known as: LIPITOR  Take 1 tablet (80 mg total) by mouth every evening. What changed: when to take this   carvedilol  12.5 MG tablet Commonly known as: COREG  Take 1 tablet (12.5 mg total) by mouth 2 (two) times daily with a meal.   cinacalcet  30 MG tablet Commonly known as: SENSIPAR  Take 30 mg by mouth daily.   fluticasone  50 MCG/ACT nasal spray Commonly known as: FLONASE  Place 2 sprays into both nostrils daily as needed for allergies or rhinitis.   fluticasone  furoate-vilanterol 200-25  MCG/INH Aepb Commonly known as: BREO ELLIPTA  Inhale 1 puff into the lungs daily.   gabapentin  100 MG capsule Commonly known as: NEURONTIN  Please take 2 capsules (200 mg) after dialysis on dialysis days only Tuesday, Thursday, Saturday.   hydrOXYzine  25 MG tablet Commonly known as: ATARAX  Take 1 tablet (25 mg total) by mouth 2 (two) times daily.   lidocaine  5 % Commonly known as: Lidoderm  Place 1 patch onto the skin daily. Remove & Discard patch within 12 hours or as directed by MD What changed:  when to take this reasons to take this   Lokelma  10 g Pack packet Generic drug: sodium zirconium cyclosilicate  Take 10 g by mouth daily for 5 days.   naloxone  4 MG/0.1ML Liqd nasal spray kit Commonly known as: NARCAN  Place 1 spray into the nose daily as needed (for overdose).   nitroGLYCERIN  0.4 MG SL tablet Commonly known as: NITROSTAT  Place 1 tablet (0.4 mg total) under the tongue every 5 (five) minutes x 3 doses as needed for chest pain.   Oxycodone  HCl 10 MG Tabs Take 1 tablet (10 mg total) by mouth 4 (four) times daily.   Ozempic (2 MG/DOSE) 8 MG/3ML Sopn Generic drug: Semaglutide (2 MG/DOSE) Inject 2 mg into the skin once a week.   pantoprazole  40 MG tablet Commonly known as: PROTONIX  Take 40 mg by mouth daily before breakfast.   polyethylene glycol powder 17 GM/SCOOP powder Commonly known as: GLYCOLAX /MIRALAX  Dissolve 1  capful (17g) in 4-8 ounces of liquid and take by mouth daily as needed for mild constipation   predniSONE  20 MG tablet Commonly known as: DELTASONE  Take 2 tablets (40 mg total) by mouth daily with breakfast for 1 day.   pregabalin  100 MG capsule Commonly known as: LYRICA  Take 100 mg by mouth 2 (two) times daily.   senna 8.6 MG Tabs tablet Commonly known as: SENOKOT Take 1 tablet (8.6 mg total) by mouth 2 (two) times daily.   tiZANidine  4 MG tablet Commonly known as: ZANAFLEX  Take 1 tablet (4 mg total) by mouth daily as needed for muscle  spasms.   torsemide  100 MG tablet Commonly known as: DEMADEX  Take 1 tablet (100mg ) on NON-dialysis days (Sunday, Monday, Wednesday, Friday)   valACYclovir  500 MG tablet Commonly known as: VALTREX  TAKE (1) TABLET BY MOUTH EVERY OTHER DAY. What changed: See the new instructions.   Velphoro  500 MG chewable tablet Generic drug: sucroferric oxyhydroxide Take 1 tablet with meals and snacks What changed:  how much to take how to take this when to take this additional instructions        Disposition and follow-up:   [name] was discharged from [facility] in good condition.  At the hospital follow up visit please address:  1.  Hyperkalemia  -Missed some dialysis sessions and admitted with K of 7.1. No EKG changes  -K 5.2 on discharge  -Discharged on 5 day course of Lokelma   -Please check renal function panel and consider long-term prescription of Lokelma .  2. Respiratory distress  -Unclear etiology vs multifactorial: volume overload vs asthma vs pneumonia vs deconditioning.  -Discharged on one remaining day of Prednisone   -Assess for respiratory symptoms  -Consider repeat PFTs for this patient to assess for asthma/COPD  3. Chronic pain  -Complicated outpatient regimen, has not found new pain management specialist since moving to [facility]  -Please consider referral to pain management for continued prescriptions.  4.  Labs / imaging needed at time of follow-up: RFP  5.  Pending labs/ test needing follow-up: None  Follow-up Appointments:  Contact information for after-discharge care     Destination     [facility] .   Service: Skilled Nursing Contact information: [address] [place]  [phone]                      Hospital Course by problem list: [name] is a 68 y.o. person living with a history of ESRD on HD TTS, moderate to severe AS, HFpEF, chronic pain due to knee OA, neuropathic  pain, anemia of chronic disease, T2DM, HTN, HLD, asthma, obesity, who presented to the ED for difficulty breathing and admitted for hyperkalemia in the setting of missed dialysis sessions and admitted for respiratory distress and hyperkalemia.   Respiratory distress  Volume overload Asthma? Pneumonia Presented with respiratory distress in the setting of recent pneumonia diagnosis and missed dialysis session. Intermittent wheezing on initial exams. Chest x-Bechler showed stable scarring or atelectasis at the right lung base. She was diagnosed with pneumonia at an urgent care 4 days before admission and was given a 5 day course of Levaquin , which was transitioned to a one day course of . Given missed HD sessions, suspect volume overload also drove her increased work of breathing. She had spirometry in 2019 with showed moderate to sever restriction with no evidence of obstruction, which makes COPD less likely, although she may have had well-controlled asthma  at this time. Given her desaturations with ambulation and intermittent wheezing she was treated with a 5 day course of prednisone , and discharged with the last 40mg  of Prednisone . She underwent 3 sessions of dialysis and was discharged at 3kgs above her dry weight, which is reportedly about how much she comes off of dialysis normally. Suspect that some of her respiratory distress is due to deconditioning due to her limited mobility from her chronic pain. Her symptoms improved after dialysis, steroid course, and completion of antibiotics and it was unclear what the primary driver of her respiratory symptoms were stemming from. She would likely benefit from PFTs in the outpatient setting. PT evaluated the patient and recommended she be discharged to a skilled nursing facility.  Hyperkalemia Patient with ESRD on HD missing sessions for family tragedy. K 7.1 on admission. Underwent hyperkalemia protocol with 10 units of insulin  and dextrose  solution. No EKG  changes noted on EKG. K 5.2 on the morning of discharge. She was discharged with a 5 day course of Lokelma   ESRD on HD TTS Metabolic bone disease Still makes urine. 3kgs above dry weight after dialysis. RFP improved after dialysis. She was maintained on her outpatient phosphorus binders.   Troponin elevation Suspect demand ischemia in setting of current stressors and volume overload. Troponins flat at 21, 23 and 20.  Chronic conditions   CAD HFpEF Moderate to severe AS Last TTE in 02/2024 with preserved EF and moderate to severe AS.  - Restarted on home Carvedilol  BID and amlodipine  - On ASA and Lipitor  80 mg daily - Holding Torsemide  on dialysis days   Anemia of ESRD Normocytic anemia Hgb 8.7 on admission. Improved from ~ 1 month ago. Last ESA administered 04/11/2024. Last IV iron  infusion 01/2024. Improved to 9.3 on admission.   Chronic pain management Severe OA of bilateral knees Restarted on home medications - Oxycodone  10 mg TID PRN (PTA) - Lyrica  100mg  BID - Gabapentin  200mg  on dialysis days - Tizanidine  4mg .   Emotional distress Anxiety Recent traumatic loss of brother-in law. Patient received spiritual care from chaplain while here.  GERD Protonix  daily    Discharge Subjective [name] feels much better this morning. She denies shortness of breath. She does endorse some continued cough after her pneumonia that she believes is improving. She has no other concerns this morning and is excited to go to rehab.  Discharge Exam:   BP (!) 155/60 (BP Location: Right Arm)   Pulse 77   Temp 98 F (36.7 C) (Oral)   Resp 18   Ht 5' 7 (1.702 m)   Wt 105.5 kg   SpO2 98%   BMI 36.43 kg/m  Discharge exam:  Physical Exam Constitutional:      General: She is not in acute distress.    Appearance: She is not ill-appearing.  Cardiovascular:     Rate and Rhythm: Normal rate and regular rhythm.     Heart sounds: Murmur heard.  Pulmonary:     Effort: Pulmonary effort  is normal.     Breath sounds: No decreased breath sounds, wheezing, rhonchi or rales.  Abdominal:     Palpations: Abdomen is soft. There is no mass.     Tenderness: There is no abdominal tenderness.  Musculoskeletal:     Comments: Trace edema  Neurological:     Mental Status: She is alert.      Pertinent Labs, Studies, and Procedures:     Latest Ref Rng & Units 05/12/2024    2:25  AM 05/11/2024    8:55 AM 05/10/2024    8:02 AM  CBC  WBC 4.0 - 10.5 K/uL 5.8  6.7  5.2   Hemoglobin 12.0 - 15.0 g/dL 9.3  9.4  9.8   Hematocrit 36.0 - 46.0 % 30.3  29.4  31.4   Platelets 150 - 400 K/uL 156  180  150        Latest Ref Rng & Units 05/12/2024    2:25 AM 05/11/2024    3:20 AM 05/10/2024    8:02 AM  CMP  Glucose 70 - 99 mg/dL 721  781  871   BUN 8 - 23 mg/dL 59  66  35   Creatinine 0.44 - 1.00 mg/dL 3.51  2.18  4.09   Sodium 135 - 145 mmol/L 135  136  134   Potassium 3.5 - 5.1 mmol/L 5.2  5.2  3.9   Chloride 98 - 111 mmol/L 95  91  93   CO2 22 - 32 mmol/L 22  25  24    Calcium  8.9 - 10.3 mg/dL 7.7  8.1  8.0     DG Chest Portable 1 View Result Date: 05/08/2024 EXAM: 1 VIEW XRAY OF THE CHEST 05/08/2024 07:08:49 AM COMPARISON: 05/04/2024 CLINICAL HISTORY: SOB, recent pneumonia, fever; rover. FINDINGS: LUNGS AND PLEURA: Scarring or atelectasis at the right lung base, without significant change. No pulmonary edema. No pleural effusion. No pneumothorax. HEART AND MEDIASTINUM: No acute abnormality of the cardiac and mediastinal silhouettes. BONES AND SOFT TISSUES: No acute osseous abnormality. IMPRESSION: 1. Stable scarring or atelectasis at the right lung base. Electronically signed by: Norleen Kil MD 05/08/2024 07:35 AM EDT RP Workstation: [id number]     Discharge Instructions:  You were hospitalized for high potassium and shortness of breath, we treated you with dialysis and steroids. Thank you for allowing us  to be part of your care.   Please call to schedule an appointment with your  primary care doctor in the next week.   Please note these changes made to your medications:   *Please START taking:  Prednisone  40mg  until 11/1 (one day) Lokelma  10g daily for 3 days  Make sure to take your Velphoro  once with meals and snacks  *Please STOP taking:  This has already been stopped but stop taking your Renvela .  Please make sure to attend all your dialysis sessions and use your inhaler.    Signed: Napoleon Limes, MD 05/12/2024, 8:26 AM

## 2024-05-12 NOTE — Progress Notes (Signed)
  [facility] Progress Note   Subjective:    Seen in room No c/o's  Objective Vitals:   05/11/24 1813 05/11/24 2033 05/12/24 0432 05/12/24 0709  BP: 126/60 122/61 (!) 146/67 (!) 155/60  Pulse: 86 84 75 77  Resp: 18 18 18 18   Temp:  98.1 F (36.7 C) 97.7 F (36.5 C) 98 F (36.7 C)  TempSrc:  Oral  Oral  SpO2: 99% 93% 98% 98%  Weight:    105.5 kg  Height:       Physical Exam General: Alert female in NAD Heart: RRR, no murmurs, rubs or gallops Lungs: CTA bilat  Abdomen: Soft, non-distended, +BS Extremities: no edema b/l lower extremities Dialysis Access: AVF + t/b  Dialysis Orders:  TTS G-O  4.5h  B450   102kg  2K bath  AVF    Heparin  3000 Skips HD somewhat frequently Usually comes off 1-3kg over Last Op HD 10/21, post wt 105kg    Assessment/Plan: Hyperkalemia: severe w/ K+ 7.1, no severe EKG changes. Resolved post HD.  ESRD: on HD TTS. SP HD here Monday, Tuesday and Thursday. SNF will transfer pt to OP HD TTS while at the facility, starting this Saturday.   HTN: bp's good, on home meds per pmd.  Volume: 3 kg up, no vol excess on exam.  Anemia of esrd: Hb 9- 10 here. Follow.  Dispo: anticipate d/c to SNF today  Myer Fret  MD  [facility] 05/12/2024, 10:44 AM  Recent Labs  Lab 05/11/24 0320 05/11/24 0855 05/12/24 0225  HGB  --  9.4* 9.3*  ALBUMIN  3.5  --  3.1*  CALCIUM  8.1*  --  7.7*  PHOS 7.4*  --  5.6*  CREATININE 7.81*  --  6.48*  K 5.2*  --  5.2*    Inpatient medications:  acetaminophen   1,000 mg Oral Q6H   amLODipine   10 mg Oral See admin instructions   aspirin  EC  81 mg Oral Daily   atorvastatin   80 mg Oral QPM   carvedilol   12.5 mg Oral BID WC   cinacalcet   30 mg Oral Q breakfast   fluticasone  furoate-vilanterol  1 puff Inhalation Daily   gabapentin   200 mg Oral Q T,Th,Sa-HD   heparin   5,000 Units Subcutaneous Q8H   hydrOXYzine   25 mg Oral BID   pantoprazole   40 mg Oral QAC breakfast   predniSONE   40 mg Oral Q breakfast    pregabalin   100 mg Oral BID   senna  1 tablet Oral BID   sucroferric oxyhydroxide  500 mg Oral TID WC     fluticasone , guaiFENesin , loperamide , oxyCODONE , polyethylene glycol powder, tiZANidine

## 2024-05-13 NOTE — Discharge Planning (Signed)
 [facility] Patient Discharge Orders- [facility] CLINIC: [facility]  Patient's name: [name] Admit/DC Dates: 05/08/2024 - 05/12/2024  Discharge Diagnoses: Hyperkalemia   ESRD  Aranesp : Given: no   Date and amount of last dose: n/a  Last Hgb: 9.3 PRBC's Given: no Date/# of units: n/a ESA dose for discharge: same dose IV Iron  dose at discharge: none  Heparin  change: no  EDW Change: no New EDW:   Bath Change: no  Access intervention/Change: no Details:  Hectorol /Calcitriol  change: no  Discharge Labs: Calcium  7.7 Phosphorus 5.6 Albumin  3.1 K+ 5.2  IV Antibiotics: no Details:  On Coumadin?: no Last INR: Next INR: Managed By:   OTHER/APPTS/LAB ORDERS: Discharging to SNF    D/C Meds to be reconciled by nurse after every discharge.  Completed By: [name], PA-C 05/13/2024, 8:08 AM  [facility] Pager: [phone]   Reviewed by: MD:______ RN_______

## 2024-05-15 ENCOUNTER — Encounter: Payer: Self-pay | Admitting: Radiology

## 2024-06-01 ENCOUNTER — Encounter (HOSPITAL_COMMUNITY): Payer: Self-pay | Admitting: Nephrology

## 2024-06-01 ENCOUNTER — Emergency Department (HOSPITAL_COMMUNITY)

## 2024-06-01 ENCOUNTER — Other Ambulatory Visit: Payer: Self-pay

## 2024-06-01 ENCOUNTER — Observation Stay (HOSPITAL_COMMUNITY)
Admission: EM | Admit: 2024-06-01 | Discharge: 2024-06-02 | Disposition: A | Discharge status: Home / Self Care | Attending: Emergency Medicine | Admitting: Emergency Medicine

## 2024-06-01 DIAGNOSIS — Z7982 Long term (current) use of aspirin: Secondary | ICD-10-CM | POA: Diagnosis not present

## 2024-06-01 DIAGNOSIS — E1122 Type 2 diabetes mellitus with diabetic chronic kidney disease: Secondary | ICD-10-CM | POA: Insufficient documentation

## 2024-06-01 DIAGNOSIS — I2510 Atherosclerotic heart disease of native coronary artery without angina pectoris: Secondary | ICD-10-CM | POA: Insufficient documentation

## 2024-06-01 DIAGNOSIS — I342 Nonrheumatic mitral (valve) stenosis: Secondary | ICD-10-CM | POA: Diagnosis not present

## 2024-06-01 DIAGNOSIS — F32A Depression, unspecified: Secondary | ICD-10-CM | POA: Insufficient documentation

## 2024-06-01 DIAGNOSIS — Z951 Presence of aortocoronary bypass graft: Secondary | ICD-10-CM | POA: Diagnosis not present

## 2024-06-01 DIAGNOSIS — J4489 Other specified chronic obstructive pulmonary disease: Secondary | ICD-10-CM | POA: Diagnosis not present

## 2024-06-01 DIAGNOSIS — T50915A Adverse effect of multiple unspecified drugs, medicaments and biological substances, initial encounter: Secondary | ICD-10-CM | POA: Insufficient documentation

## 2024-06-01 DIAGNOSIS — E785 Hyperlipidemia, unspecified: Secondary | ICD-10-CM | POA: Insufficient documentation

## 2024-06-01 DIAGNOSIS — N186 End stage renal disease: Secondary | ICD-10-CM | POA: Diagnosis not present

## 2024-06-01 DIAGNOSIS — I350 Nonrheumatic aortic (valve) stenosis: Secondary | ICD-10-CM | POA: Diagnosis not present

## 2024-06-01 DIAGNOSIS — M170 Bilateral primary osteoarthritis of knee: Secondary | ICD-10-CM | POA: Diagnosis not present

## 2024-06-01 DIAGNOSIS — I132 Hypertensive heart and chronic kidney disease with heart failure and with stage 5 chronic kidney disease, or end stage renal disease: Secondary | ICD-10-CM | POA: Insufficient documentation

## 2024-06-01 DIAGNOSIS — I5040 Unspecified combined systolic (congestive) and diastolic (congestive) heart failure: Secondary | ICD-10-CM | POA: Insufficient documentation

## 2024-06-01 DIAGNOSIS — K5900 Constipation, unspecified: Secondary | ICD-10-CM | POA: Insufficient documentation

## 2024-06-01 DIAGNOSIS — G8929 Other chronic pain: Secondary | ICD-10-CM | POA: Diagnosis not present

## 2024-06-01 DIAGNOSIS — F419 Anxiety disorder, unspecified: Secondary | ICD-10-CM | POA: Insufficient documentation

## 2024-06-01 DIAGNOSIS — Z79899 Other long term (current) drug therapy: Secondary | ICD-10-CM | POA: Diagnosis not present

## 2024-06-01 DIAGNOSIS — R630 Anorexia: Secondary | ICD-10-CM | POA: Diagnosis not present

## 2024-06-01 DIAGNOSIS — E871 Hypo-osmolality and hyponatremia: Secondary | ICD-10-CM | POA: Insufficient documentation

## 2024-06-01 DIAGNOSIS — M792 Neuralgia and neuritis, unspecified: Secondary | ICD-10-CM | POA: Insufficient documentation

## 2024-06-01 DIAGNOSIS — K219 Gastro-esophageal reflux disease without esophagitis: Secondary | ICD-10-CM | POA: Insufficient documentation

## 2024-06-01 DIAGNOSIS — R4182 Altered mental status, unspecified: Principal | ICD-10-CM | POA: Diagnosis present

## 2024-06-01 DIAGNOSIS — G9340 Encephalopathy, unspecified: Principal | ICD-10-CM

## 2024-06-01 HISTORY — DX: End stage renal disease: N18.6

## 2024-06-01 LAB — CBC WITH DIFFERENTIAL/PLATELET
Abs Immature Granulocytes: 0.05 K/uL (ref 0.00–0.07)
Basophils Absolute: 0 K/uL (ref 0.0–0.1)
Basophils Relative: 0 %
Eosinophils Absolute: 0.2 K/uL (ref 0.0–0.5)
Eosinophils Relative: 4 %
HCT: 31.9 % — ABNORMAL LOW (ref 36.0–46.0)
Hemoglobin: 9.6 g/dL — ABNORMAL LOW (ref 12.0–15.0)
Immature Granulocytes: 1 %
Lymphocytes Relative: 29 %
Lymphs Abs: 1.4 K/uL (ref 0.7–4.0)
MCH: 27 pg (ref 26.0–34.0)
MCHC: 30.1 g/dL (ref 30.0–36.0)
MCV: 89.9 fL (ref 80.0–100.0)
Monocytes Absolute: 0.6 K/uL (ref 0.1–1.0)
Monocytes Relative: 12 %
Neutro Abs: 2.6 K/uL (ref 1.7–7.7)
Neutrophils Relative %: 54 %
Platelets: 139 K/uL — ABNORMAL LOW (ref 150–400)
RBC: 3.55 MIL/uL — ABNORMAL LOW (ref 3.87–5.11)
RDW: 17.2 % — ABNORMAL HIGH (ref 11.5–15.5)
WBC: 4.8 K/uL (ref 4.0–10.5)
nRBC: 0 % (ref 0.0–0.2)

## 2024-06-01 LAB — I-STAT CG4 LACTIC ACID, ED
Lactic Acid, Venous: 1.4 mmol/L (ref 0.5–1.9)
Lactic Acid, Venous: 1.3 mmol/L (ref 0.5–1.9)

## 2024-06-01 LAB — COMPREHENSIVE METABOLIC PANEL WITH GFR
ALT: 17 U/L (ref 0–44)
AST: 15 U/L (ref 15–41)
Albumin: 3.1 g/dL — ABNORMAL LOW (ref 3.5–5.0)
Alkaline Phosphatase: 171 U/L — ABNORMAL HIGH (ref 38–126)
Anion gap: 16 — ABNORMAL HIGH (ref 5–15)
BUN: 28 mg/dL — ABNORMAL HIGH (ref 8–23)
CO2: 27 mmol/L (ref 22–32)
Calcium: 8 mg/dL — ABNORMAL LOW (ref 8.9–10.3)
Chloride: 90 mmol/L — ABNORMAL LOW (ref 98–111)
Creatinine, Ser: 7.48 mg/dL — ABNORMAL HIGH (ref 0.44–1.00)
GFR, Estimated: 6 mL/min — ABNORMAL LOW (ref >60)
Glucose, Bld: 121 mg/dL — ABNORMAL HIGH (ref 70–99)
Potassium: 3.9 mmol/L (ref 3.5–5.1)
Sodium: 133 mmol/L — ABNORMAL LOW (ref 135–145)
Total Bilirubin: 0.3 mg/dL (ref 0.0–1.2)
Total Protein: 6.7 g/dL (ref 6.5–8.1)

## 2024-06-01 LAB — PROTIME-INR
INR: 1.1 (ref 0.8–1.2)
Prothrombin Time: 15.1 s (ref 11.4–15.2)

## 2024-06-01 LAB — BRAIN NATRIURETIC PEPTIDE: B Natriuretic Peptide: 89.9 pg/mL (ref 0.0–100.0)

## 2024-06-01 LAB — CBG MONITORING, ED: Glucose-Capillary: 115 mg/dL — ABNORMAL HIGH (ref 70–99)

## 2024-06-01 MED ORDER — INSULIN ASPART 100 UNIT/ML IJ SOLN
0.0000 [IU] | Freq: Three times a day (TID) | INTRAMUSCULAR | Status: DC
  Administered 2024-06-02: 1 [IU] via SUBCUTANEOUS
  Filled 2024-06-01: qty 3

## 2024-06-01 MED ORDER — ACETAMINOPHEN 650 MG RE SUPP: 650.0000 mg | Freq: Four times a day (QID) | RECTAL | Status: DC | PRN

## 2024-06-01 MED ORDER — LIDOCAINE 5 % EX PTCH: 1.0000 | MEDICATED_PATCH | CUTANEOUS | Status: DC

## 2024-06-01 MED ORDER — HYDROXYZINE HCL 25 MG PO TABS
25.0000 mg | ORAL_TABLET | Freq: Three times a day (TID) | ORAL | Status: DC | PRN
Start: 2024-06-01 — End: 2024-06-02

## 2024-06-01 MED ORDER — ATORVASTATIN CALCIUM 40 MG PO TABS
80.0000 mg | ORAL_TABLET | Freq: Every day | ORAL | Status: DC
  Administered 2024-06-01: 80 mg via ORAL
  Filled 2024-06-01: qty 1

## 2024-06-01 MED ORDER — VALACYCLOVIR HCL 500 MG PO TABS
500.0000 mg | ORAL_TABLET | ORAL | Status: DC
  Administered 2024-06-02: 500 mg via ORAL
  Filled 2024-06-01: qty 1

## 2024-06-01 MED ORDER — ACETAMINOPHEN 325 MG PO TABS: 650.0000 mg | ORAL_TABLET | Freq: Four times a day (QID) | ORAL | Status: DC | PRN

## 2024-06-01 MED ORDER — SENNOSIDES-DOCUSATE SODIUM 8.6-50 MG PO TABS: 1.0000 | ORAL_TABLET | Freq: Every evening | ORAL | Status: DC | PRN

## 2024-06-01 MED ORDER — LIDOCAINE 4 % EX CREA: TOPICAL_CREAM | Freq: Three times a day (TID) | CUTANEOUS | Status: DC | PRN

## 2024-06-01 MED ORDER — POLYETHYLENE GLYCOL 3350 17 G PO PACK: 17.0000 g | PACK | Freq: Every day | ORAL | Status: DC | PRN

## 2024-06-01 MED ORDER — CAPSAICIN 0.025 % EX CREA: TOPICAL_CREAM | Freq: Three times a day (TID) | CUTANEOUS | Status: DC | PRN

## 2024-06-01 MED ORDER — CHLORHEXIDINE GLUCONATE CLOTH 2 % EX PADS: 6.0000 | MEDICATED_PAD | Freq: Every day | CUTANEOUS | Status: DC

## 2024-06-01 MED ORDER — SERTRALINE HCL 50 MG PO TABS
25.0000 mg | ORAL_TABLET | Freq: Every day | ORAL | Status: DC
  Administered 2024-06-02: 25 mg via ORAL
  Filled 2024-06-01: qty 1

## 2024-06-01 MED ORDER — ENSURE PLUS HIGH PROTEIN PO LIQD
237.0000 mL | Freq: Two times a day (BID) | ORAL | Status: DC
  Administered 2024-06-02: 237 mL via ORAL
  Filled 2024-06-01: qty 237

## 2024-06-01 MED ORDER — HEPARIN SODIUM (PORCINE) 1000 UNIT/ML IJ SOLN
INTRAMUSCULAR | Status: AC
Start: 2024-06-01 — End: 2024-06-01
  Filled 2024-06-01: qty 4

## 2024-06-01 MED ORDER — DICLOFENAC SODIUM 1 % EX GEL
2.0000 g | Freq: Four times a day (QID) | CUTANEOUS | Status: DC
  Filled 2024-06-01: qty 100

## 2024-06-01 MED ORDER — SENNA 8.6 MG PO TABS
1.0000 | ORAL_TABLET | Freq: Two times a day (BID) | ORAL | Status: DC
  Filled 2024-06-01 (×2): qty 1

## 2024-06-01 MED ORDER — HEPARIN SODIUM (PORCINE) 5000 UNIT/ML IJ SOLN
5000.0000 [IU] | Freq: Three times a day (TID) | INTRAMUSCULAR | Status: DC
  Administered 2024-06-01: 5000 [IU] via SUBCUTANEOUS
  Filled 2024-06-01 (×2): qty 1

## 2024-06-01 MED ORDER — PANTOPRAZOLE SODIUM 40 MG PO TBEC
40.0000 mg | DELAYED_RELEASE_TABLET | Freq: Every day | ORAL | Status: DC
  Administered 2024-06-02: 40 mg via ORAL
  Filled 2024-06-01: qty 1

## 2024-06-01 MED ORDER — FLUTICASONE FUROATE-VILANTEROL 200-25 MCG/INH IN AEPB: 1.0000 | INHALATION_SPRAY | Freq: Every day | RESPIRATORY_TRACT | Status: DC

## 2024-06-01 NOTE — Consult Note (Signed)
 Renal Service Consult Note [facility] [name], MD  Patient: [name] Date: 06/01/2024 Requesting Physician: Dr. Gennaro  Reason for Consult: ESRD pt w/ AMS HPI: The patient is a 68 y.o. year-old w/ PMH as below who presented to ED sent from SNF from hypotension prior to dialysis today. Pt also c/o lethargy since last HD Tuesday. BP at SNF was 80/50. Rec'd 500 cc NS en route. Also having back pains. Uses WC for ambulation currently. Medical problems are ESRD on HD TTS, CHF, obesity, back pain.  In ED BP 97/62 -> 118/47, HR 70, RR 18, temp 97.9, 96% on RA.  Lab showed K+ 3.9, bun 28, creatr 7.4, CO2 27, WBC 4K, Hb 9.6, BNP 90. LA 1.4, 1.3. CXR is clear, no edema.    Pt seen in ED room. She is a bit lethargic, but gives a good history otherwise. She takes Oxy 10mg  for her back pain. Her last HD was Tuesday. Her BP's are sometimes low.    ROS - denies CP, no joint pain, no HA, no blurry vision, no rash, no diarrhea, no nausea/ vomiting   Past Medical History  Past Medical History:  Diagnosis Date   Anemia    Aortic stenosis    Echo 8/18: mean 13, peak 28, LVOT/AV mean velocity 0.51   Arthritis    Asthma    As a child    Bronchitis    CAD (coronary artery disease)    a. 09/2016: 50% Ost 1st Mrg stenosis, 50% 2nd Mrg stenosis, 20% Mid-Cx, 95% Prox LAD, 40% mid-LAD, and 10% dist-LAD stenosis. Staged PCI with DES to Prox-LAD.    Chronic combined systolic and diastolic CHF (congestive heart failure) (HCC) 2011   echo 2/18: EF 55-60, normal wall motion, grade 2 diastolic dysfunction, trivial AI // echo 3/18: Septal and apical HK, EF 45-50, normal wall motion, trivial AI, mild LAE, PASP 38 // echo 8/18: EF 60-65, normal wall motion, grade 1 diastolic dysfunction, calcified aortic valve leaflets, mild aortic stenosis (mean 13, peak 28, LVOT/AV mean velocity 0.51), mild AI, moderate MAC, mild LAE, trivial TR    Chronic kidney disease    STAGE 4   Chronic kidney  disease on chronic dialysis (HCC)    t, th, sat   Complication of anesthesia    Depression    Diabetes mellitus Dx 1989   Elevated lipids    GERD (gastroesophageal reflux disease)    Gout    Heart murmur    asymptomatic   Hepatitis C Dx 2013   Hypertension Dx 1989   Infected surgical wound    Lt arm   Myocardial infarction (HCC) 07/2015   Obesity    Pancreatitis 2013   Pneumonia    Refusal of blood transfusions as patient is Jehovah's Witness    pt states she is not jehovas witness and does not refuse blood products   Tendinitis    Tremors of nervous system    LEFT HAND   Ulcer 2010   Past Surgical History  Past Surgical History:  Procedure Laterality Date   A/V FISTULAGRAM Left 04/11/2019   Procedure: A/V FISTULAGRAM;  Surgeon: Jama Cordella MATSU, MD;  Location: [facility] INVASIVE CV LAB;  Service: Cardiovascular;  Laterality: Left;   A/V FISTULAGRAM Left 06/02/2019   Procedure: A/V FISTULAGRAM;  Surgeon: Jama Cordella MATSU, MD;  Location: [facility];  Service: Cardiovascular;  Laterality: Left;   AMPUTATION Left 10/28/2022   Procedure: LEFT FOOT 1ST Jerome AMPUTATION;  Surgeon: Harden Jerona GAILS, MD;  Location: [facility] OR;  Service: Orthopedics;  Laterality: Left;   APPLICATION OF WOUND VAC Left 08/14/2017   Procedure: APPLICATION OF WOUND VAC Exchange;  Surgeon: Dessa Reyes ORN, MD;  Location: [facility] ORS;  Service: General;  Laterality: Left;   APPLICATION OF WOUND VAC Left 12/21/2018   Procedure: APPLICATION OF WOUND VAC;  Surgeon: Jama Cordella MATSU, MD;  Location: [facility] ORS;  Service: Vascular;  Laterality: Left;   AV FISTULA PLACEMENT Left 08/19/2018   Procedure: ARTERIOVENOUS (AV) FISTULA CREATION ( BRACHIOBASILIC );  Surgeon: Jama Cordella MATSU, MD;  Location: [facility] ORS;  Service: Vascular;  Laterality: Left;   BASCILIC VEIN TRANSPOSITION Left 11/18/2018   Procedure: BASCILIC VEIN TRANSPOSITION;  Surgeon: Jama Cordella MATSU, MD;  Location: [facility] ORS;  Service: Vascular;  Laterality:  Left;   BIOPSY  09/20/2020   Procedure: BIOPSY;  Surgeon: Rollin Dover, MD;  Location: [facility] ENDOSCOPY;  Service: Endoscopy;;   CHOLECYSTECTOMY     COLONOSCOPY WITH PROPOFOL  N/A 02/03/2018   Procedure: COLONOSCOPY WITH PROPOFOL ;  Surgeon: Unk Corinn Skiff, MD;  Location: [facility] ENDOSCOPY;  Service: Gastroenterology;  Laterality: N/A;   COLONOSCOPY WITH PROPOFOL  N/A 07/25/2022   Procedure: COLONOSCOPY WITH PROPOFOL ;  Surgeon: Maryruth Ole DASEN, MD;  Location: [facility] ENDOSCOPY;  Service: Endoscopy;  Laterality: N/A;   CORONARY ANGIOPLASTY  07/2015   STENT   CORONARY STENT INTERVENTION N/A 09/18/2016   Procedure: Coronary Stent Intervention;  Surgeon: Debby DELENA Sor, MD;  Location: [facility];  Service: Cardiovascular;  Laterality: N/A;   DIALYSIS/PERMA CATHETER INSERTION N/A 05/10/2018   Procedure: DIALYSIS/PERMA CATHETER INSERTION;  Surgeon: Jama Cordella MATSU, MD;  Location: [facility];  Service: Cardiovascular;  Laterality: N/A;   DIALYSIS/PERMA CATHETER INSERTION N/A 01/21/2024   Procedure: DIALYSIS/PERMA CATHETER INSERTION;  Surgeon: Lanis Fonda BRAVO, MD;  Location: [facility];  Service: Cardiovascular;  Laterality: N/A;   DRESSING CHANGE UNDER ANESTHESIA Left 08/15/2017   Procedure: exploration of wound for bleeding;  Surgeon: Dessa Reyes ORN, MD;  Location: [facility] ORS;  Service: General;  Laterality: Left;   ESOPHAGOGASTRODUODENOSCOPY (EGD) WITH PROPOFOL  N/A 02/03/2018   Procedure: ESOPHAGOGASTRODUODENOSCOPY (EGD) WITH PROPOFOL ;  Surgeon: Unk Corinn Skiff, MD;  Location: [facility] ENDOSCOPY;  Service: Gastroenterology;  Laterality: N/A;   ESOPHAGOGASTRODUODENOSCOPY (EGD) WITH PROPOFOL  N/A 09/20/2020   Procedure: ESOPHAGOGASTRODUODENOSCOPY (EGD) WITH PROPOFOL ;  Surgeon: Rollin Dover, MD;  Location: [facility] ENDOSCOPY;  Service: Endoscopy;  Laterality: N/A;   ESOPHAGOGASTRODUODENOSCOPY (EGD) WITH PROPOFOL  N/A 07/25/2022   Procedure: ESOPHAGOGASTRODUODENOSCOPY (EGD) WITH PROPOFOL ;  Surgeon:  Maryruth Ole DASEN, MD;  Location: [facility] ENDOSCOPY;  Service: Endoscopy;  Laterality: N/A;   EYE SURGERY  11/17/2018   INCISION AND DRAINAGE ABSCESS Left 08/12/2017   Procedure: INCISION AND DRAINAGE ABSCESS;  Surgeon: Dessa Reyes ORN, MD;  Location: [facility] ORS;  Service: General;  Laterality: Left;   KNEE ARTHROSCOPY     LEFT HEART CATH N/A 09/18/2016   Procedure: Left Heart Cath;  Surgeon: Debby DELENA Sor, MD;  Location: [facility];  Service: Cardiovascular;  Laterality: N/A;   LEFT HEART CATH AND CORONARY ANGIOGRAPHY N/A 09/16/2016   Procedure: Left Heart Cath and Coronary Angiography;  Surgeon: Lonni JONETTA Cash, MD;  Location: [facility] INVASIVE CV LAB;  Service: Cardiovascular;  Laterality: N/A;   LEFT HEART CATH AND CORONARY ANGIOGRAPHY N/A 04/29/2017   Procedure: LEFT HEART CATH AND CORONARY ANGIOGRAPHY;  Surgeon: Mady Lonni, MD;  Location: [facility];  Service: Cardiovascular;  Laterality: N/A;  LOWER EXTREMITY ANGIOGRAPHY Right 03/08/2018   Procedure: LOWER EXTREMITY ANGIOGRAPHY;  Surgeon: Jama Cordella MATSU, MD;  Location: [facility];  Service: Cardiovascular;  Laterality: Right;   PERIPHERAL VASCULAR THROMBECTOMY  01/21/2024   Procedure: PERIPHERAL VASCULAR THROMBECTOMY;  Surgeon: Lanis Fonda BRAVO, MD;  Location: [facility];  Service: Cardiovascular;;   TUBAL LIGATION     TUBAL LIGATION     UPPER EXTREMITY ANGIOGRAPHY Right 09/19/2019   Procedure: UPPER EXTREMITY ANGIOGRAPHY;  Surgeon: Jama Cordella MATSU, MD;  Location: [facility];  Service: Cardiovascular;  Laterality: Right;   VENOUS STENT  01/21/2024   Procedure: VENOUS STENT;  Surgeon: Lanis Fonda BRAVO, MD;  Location: [facility];  Service: Cardiovascular;;   WOUND DEBRIDEMENT Left 12/21/2018   Procedure: DEBRIDEMENT WOUND;  Surgeon: Jama Cordella MATSU, MD;  Location: [facility] ORS;  Service: Vascular;  Laterality: Left;   WOUND DEBRIDEMENT Left 12/30/2018   Procedure: DEBRIDEMENT WOUND WITH VAC  PLACEMENT (LEFT UPPER EXTREMITY);  Surgeon: Jama Cordella MATSU, MD;  Location: [facility] ORS;  Service: Vascular;  Laterality: Left;   Family History  Family History  Problem Relation Age of Onset   Colon cancer Mother    Heart attack Other    Heart attack Maternal Grandmother    Hypertension Sister    Hypertension Brother    Diabetes Paternal Grandmother    Breast cancer Neg Hx    Social History  reports that she quit smoking about 43 years ago. Her smoking use included cigarettes. She started smoking about 49 years ago. She has a 1.5 pack-year smoking history. She has never used smokeless tobacco. She reports that she does not currently use alcohol. She reports that she does not currently use drugs after having used the following drugs: Marijuana. Allergies  Allergies  Allergen Reactions   Shellfish Allergy Anaphylaxis and Swelling   Diazepam  Other (See Comments)    felt like out of body experience   Morphine  Itching and Other (See Comments)    Tolerated hydromorphone  on 07/2020 *will take along with Benadryl *   Home medications Prior to Admission medications   Medication Sig Start Date End Date Taking? Authorizing Provider  albuterol  (VENTOLIN  HFA) 108 (90 Base) MCG/ACT inhaler Inhale 2 puffs into the lungs every 4 (four) hours as needed for wheezing or shortness of breath. 06/28/20  Yes Vivienne Delon HERO, PA-C  amLODipine  (NORVASC ) 10 MG tablet Take 10 mg by mouth daily.   Yes [provider]  aspirin  81 MG EC tablet Take 1 tablet (81 mg total) by mouth daily. 09/20/20  Yes Singh, Prashant K, MD  atorvastatin  (LIPITOR ) 80 MG tablet Take 1 tablet (80 mg total) by mouth every evening. Patient taking differently: Take 80 mg by mouth at bedtime. 04/24/21  Yes Bacigalupo, Angela M, MD  carvedilol  (COREG ) 12.5 MG tablet Take 1 tablet (12.5 mg total) by mouth 2 (two) times daily with a meal. 09/19/21  Yes Paige, Victoria J, DO  diclofenac  Sodium (VOLTAREN ) 1 % GEL Apply 2 g  topically in the morning, at noon, and at bedtime. Left and right knee   Yes [provider]  fluticasone  (FLONASE ) 50 MCG/ACT nasal spray Place 2 sprays into both nostrils daily as needed for allergies or rhinitis. 09/28/17  Yes Vivienne Delon HERO, PA-C  fluticasone  furoate-vilanterol (BREO ELLIPTA ) 200-25 MCG/INH AEPB Inhale 1 puff into the lungs daily. 08/30/20  Yes Vivienne Delon HERO, PA-C  gabapentin  (NEURONTIN ) 100 MG capsule Please take 2 capsules (200 mg) after dialysis on  dialysis days only Tuesday, Thursday, Saturday. 03/09/24  Yes Everhart, Kirstie, DO  hydrOXYzine  (ATARAX ) 25 MG tablet Take 1 tablet (25 mg total) by mouth 2 (two) times daily. 11/17/22  Yes Hongalgi, Anand D, MD  naloxone  (NARCAN ) nasal spray 4 mg/0.1 mL Place 1 spray into the nose daily as needed (for overdose). 04/08/22  Yes [provider]  Oxycodone  HCl 10 MG TABS Take 1 tablet (10 mg total) by mouth 3 (three) times daily. 05/12/24  Yes Smucker, Nathanael, MD  OZEMPIC, 2 MG/DOSE, 8 MG/3ML SOPN Inject 2 mg into the skin once a week.   Yes [provider]  pantoprazole  (PROTONIX ) 40 MG tablet Take 40 mg by mouth daily before breakfast.   Yes [provider]  polyethylene glycol powder (GLYCOLAX /MIRALAX ) 17 GM/SCOOP powder Dissolve 1 capful (17g) in 4-8 ounces of liquid and take by mouth daily as needed for mild constipation 03/30/24  Yes Janna Ferrier, DO  pregabalin  (LYRICA ) 100 MG capsule Take 1 capsule (100 mg total) by mouth 2 (two) times daily. 05/12/24  Yes Smucker, Melvenia, MD  senna (SENOKOT) 8.6 MG TABS tablet Take 1 tablet (8.6 mg total) by mouth 2 (two) times daily. 03/30/24  Yes Gomes, Adriana, DO  sertraline  (ZOLOFT ) 25 MG tablet Take 25 mg by mouth daily. 05/22/24  Yes [provider]  torsemide  (DEMADEX ) 100 MG tablet Take 1 tablet (100mg ) on NON-dialysis days (Sunday, Monday, Wednesday, Friday) 03/08/24  Yes Everhart, Kirstie, DO  valACYclovir  (VALTREX ) 500 MG  tablet TAKE (1) TABLET BY MOUTH EVERY OTHER DAY. Patient taking differently: Take 500 mg by mouth every other day. 01/24/21  Yes Bacigalupo, Jon HERO, MD  VELPHORO  500 MG chewable tablet Take 1 tablet with meals and snacks 05/12/24  Yes Smucker, Nathanael, MD  ammonium lactate (LAC-HYDRIN) 12 % lotion Apply 1 Application topically daily as needed for dry skin. Patient not taking: Reported on 06/01/2024 12/15/23   [provider]  cinacalcet  (SENSIPAR ) 30 MG tablet Take 30 mg by mouth daily. Patient not taking: Reported on 06/01/2024 04/11/24   [provider]  lidocaine  (LIDODERM ) 5 % Place 1 patch onto the skin daily. Remove & Discard patch within 12 hours or as directed by MD Patient not taking: Reported on 06/01/2024 06/25/23   Palumbo, April, MD  nitroGLYCERIN  (NITROSTAT ) 0.4 MG SL tablet Place 1 tablet (0.4 mg total) under the tongue every 5 (five) minutes x 3 doses as needed for chest pain. Patient not taking: Reported on 06/01/2024 03/08/24   Howell Lunger, DO  tiZANidine  (ZANAFLEX ) 4 MG tablet Take 1 tablet (4 mg total) by mouth daily as needed for muscle spasms. Patient not taking: Reported on 06/01/2024 03/08/24   Everhart, Kirstie, DO     Vitals:   06/01/24 1200 06/01/24 1348 06/01/24 1428 06/01/24 1430  BP: 97/61  (!) 118/47   Pulse: 71 68 70   Resp: 12 18 16    Temp: 97.9 F (36.6 C)     TempSrc: Oral     SpO2: 100% 96% 96% 96%  Weight: 104.3 kg     Height: 5' 5 (1.651 m)      Exam Gen alert, no distress, BP 118/47 Sclera anicteric, throat clear  No jvd or bruits Chest clear bilat to bases RRR no MRG Abd soft ntnd no mass or ascites +bs Ext no LE or UE edema Neuro is alert, Ox 3 , nf    LUA AVF+bruit   Home bp meds: Norvasc  10 every day Coreg  12.5 bid Demadex   100mg  x 1 on non hd days   OP HD: TTS G-O 4.5h   B450    101.9kg   AVF  Heparin  3000 Last OP HD 11/18, post wt 102.3kg    Assessment/ Plan: Hypotension: per EMS bp's 80/50 originally,  responded to fluids and now 110/50. BNP low, alb high and CXR clear, and exam also shows no edema. She likely is dry. Will keep even w/ HD tonight.   AMS: probably due to pain medications taking for her back pain.  ESRD: on HD TTS. Last HD Tuesday. HD tonight.  BP: bp's soft today, hold all bp lowering meds, got some IVFs. Can give more boluses if needed (NS 250 cc  IV x 1-3 if needed).  Volume: as above Anemia of esrd: Hb 9.6, follow.        Myer Fret  MD [facility] 06/01/2024, 4:04 PM  Recent Labs  Lab 06/01/24 1219  HGB 9.6*  ALBUMIN  3.1*  CALCIUM  8.0*  CREATININE 7.48*  K 3.9   Inpatient medications:

## 2024-06-01 NOTE — ED Notes (Signed)
 Report given to dialysis nurse, no new questions at this time.

## 2024-06-01 NOTE — Progress Notes (Signed)
   06/01/24 2207  Vitals  BP 128/61  Pulse Rate 75  Resp 10  Oxygen  Therapy  SpO2 90 %  O2 Device Room Air  Patient Activity (if Appropriate) In bed  Pulse Oximetry Type Continuous  Oximetry Probe Site Changed No  Post Treatment  Dialyzer Clearance Clear  Hemodialysis Intake (mL) 200 mL  Liters Processed 84  Fluid Removed (mL) 0 mL  Tolerated HD Treatment Yes  AVG/AVF Arterial Site Held (minutes) 8 minutes  AVG/AVF Venous Site Held (minutes) 8 minutes   Pt was hypotensive throughout tx, diastolic continues to be low post tx. Floor nurse notifed in ED. Pt is asymptomatic and stable

## 2024-06-01 NOTE — Hospital Course (Addendum)
 #AMS #Polypharmacy Patient with no new recent medications, however does have history of ESRD and is taking several centrally acting medications for back pain and neuropathy including oxycodone , gabapentin , pregabalin .  Patient is alert and oriented to name, place, situation, season, year.  She does appear tired and sluggish.  This improved 1 day after discharge.  Suspect this is likely due to combination of medications (oxycodone , gabapentin , pregabalin , tizanidine ) and ESRD. Not hypoglycemic or uremic.  Do not suspect any neurologic process.  We held the centrally acting medications and she received 1 session of dialysis during her admission.  Upon discharge, her gabapentin  and tizanidine  were discontinued.  Recommend that she follow-up with PCP or pain clinic for further optimization of her regimen.   #ESRD on HD TThS #History of hyperphosphatemia Missed hemodialysis session the morning of admission.  Creatinine with expected rise.  Nephrology was consulted and recommended dialysis the night of admission which she completed.  She does have previous history of hyperphosphatemia and patient's medication regimen includes phosphate binder (Velphoro ), however the patient's phosphorus levels were within normal limits.  Due to difficulties with hypotension, the patient was started on regimen of midodrine  10 mg 3 times daily per nephrology recommendations.   #Acute on chronic back pain #Bilateral knee OA #Neuropathy Follows with [facility] for bilateral knee OA and chronic lower back pain.  Home pain regimen includes Oxycodone  10 mg 3 times daily, but also has gabapentin  and pregabalin  for neuropathy.  MRI of lumbar spine in September 2025 showed DDD and severe facet arthrosis notably at L5-S1, findings correlating to mild radiculopathy.  No acute findings.  On admission exam, the patient had tenderness to palpation of lumbar spine as well as paraspinal muscles without palpable deformities.  Suspect patient's  pain is likely due to OA or MSK-related pain.  Given patient's AMS and suspected medication-induced encephalopathy, she was treated conservatively with topicals which she declined.  Will likely need to follow-up with her orthopedic surgeon or pain clinic for further optimization of her pain.  Perhaps, this patient would benefit from nerve block or arterial embolization procedure.  She was discharged with recommendations for home health PT and OT.   #Nausea/Vomiting #Decreased Appetite Patient reported decreased appetite, N/V a few days prior to admission including during dialysis.  Albumin  slightly decreased at 3.1 with normal total protein.  Unclear etiology of nausea/vomiting.  May be in the setting of medications including Ozempic, phosphate binders.  Added Ensure protein supplementation and encouraged p.o. intake.   #Anemia of ESRD Hemoglobin stable at 9.6 compared to values during previous admission. No bleeding concerns. On day of discharge her hemoglobin was 9.9.   #Hypotension #History of Hypertension Per EMS, BP was originally 80/50 which improved with fluids to 110/50.  She did receive an additional 250cc bolus due to low pressures overnight. After discussion with nephrology, decision was made to start her on midodrine  10mg  tid. We held her antihypertensives during this admission and discontinued them on discharge.   #Hyponatremia Mildly hyponatremic with sodium of 133 on admission.  Suspect this is due to hypovolemia in the setting of poor p.o. intake.  Appeared slightly dry on exam.  Will continue to monitor with increased oral fluids and she received small fluid bolus. Sodium stable at 133 upon discharge.   #T2DM Initial CBG of 115.  Last A1c of 7.0% on 03/03/2024.  Home medication regimen includes Ozempic 2 mg weekly, but do not see any other antihyperglycemics on her medication list. We held her Ozempic and started  her on SSI while inpatient.  BGs were grossly controlled.   #Combined  Systolic and Diastolic HF #Aortic Stenosis #Mitral Stenosis Last echo on 03/04/2024 showing LVEF of 65-70% with grade 1 diastolic dysfunction.  Moderate dilation of LA.  Mitral valve degenerative with moderate mitral stenosis.  Moderate to severe aortic valve stenosis.  Current medications include carvedilol  12.5 mg twice daily, torsemide  100 mg on non-HD days.  Held her carvedilol  and torsemide  during her stay.  CXR without signs of volume overload.  Patient appears volume down on exam.  We discontinued carvedilol  but kept torsemide  upon discharge.     Stable Medical Problems   #COPD We continued home Breo Ellipta  inhaler 1 puff daily.   #GERD We continued home Protonix  40 mg EC before breakfast.   #Constipation We continued home MiraLAX  and senna.   #HLD We continued Lipitor  80 mg at bedtime.   #Anxiety #Depression We continued home sertraline  and Atarax  as needed which she did not require.

## 2024-06-01 NOTE — H&P (Addendum)
 Date: 06/01/2024               Patient Name:  [name] MRN: [id number]  DOB: 1955-07-19 Age / Sex: 68 y.o., female   PCP: Pcp, No         Medical Service: Internal Medicine Teaching Service         Attending Physician: Dr. Jone Dauphin      First Contact: Letha Cheadle, MD    Second Contact: Dr. Lonni Africa, DO         Pager Information: First Contact Pager: [phone]   Second Contact Pager: [phone]   SUBJECTIVE   Chief Complaint: Altered Mental Status  History of Present Illness  [name] is a 68 y.o. female with PMHx of ESRD on HD (TThSa), CAD s/p PCI (2018), moderate to severe AS, HFpEF, T2DM, chronic pain due to knee OA and lower back pain, asthma, obesity, HTN, HLD, neuropathy, who was BIBEM from [facility] for evaluation of drowsiness this morning.  The patient states that she had trouble staying awake and was unable to go to her dialysis session today.  She also mentions that her back pain has been so severe that it woke her from sleep last night.  She describes her back pain as sharp, exacerbated by movement, and located in the center part of her lower back.  She does have a history of back pain for which she sees an orthopedic surgeon.  For pain, the patient takes oxycodone  and gabapentin , but pregabalin  is also on her medication list.  Additional symptoms include decreased appetite and nausea/vomiting which have occurred since Tuesday.  She states that her last dialysis session on Tuesday she vomited.  Denies any recent sick contacts, new medications, constipation/diarrhea.  Notes that she recently had pneumonia 3 weeks ago.  Of note, the patient was admitted to IM [facility] on 05/09/2024 for shortness of breath after missing hemodialysis session - thought to be multifactorial due to volume overload, asthma, pneumonia, and/or deconditioning.  She also was found to be hyperkalemic and discharged with 5-day course of Lokelma .   Meds  Current Meds  Medication Sig    albuterol  (VENTOLIN  HFA) 108 (90 Base) MCG/ACT inhaler Inhale 2 puffs into the lungs every 4 (four) hours as needed for wheezing or shortness of breath.   amLODipine  (NORVASC ) 10 MG tablet Take 10 mg by mouth daily.   aspirin  81 MG EC tablet Take 1 tablet (81 mg total) by mouth daily.   atorvastatin  (LIPITOR ) 80 MG tablet Take 1 tablet (80 mg total) by mouth every evening. (Patient taking differently: Take 80 mg by mouth at bedtime.)   carvedilol  (COREG ) 12.5 MG tablet Take 1 tablet (12.5 mg total) by mouth 2 (two) times daily with a meal.   diclofenac  Sodium (VOLTAREN ) 1 % GEL Apply 2 g topically in the morning, at noon, and at bedtime. Left and right knee   fluticasone  (FLONASE ) 50 MCG/ACT nasal spray Place 2 sprays into both nostrils daily as needed for allergies or rhinitis.   fluticasone  furoate-vilanterol (BREO ELLIPTA ) 200-25 MCG/INH AEPB Inhale 1 puff into the lungs daily.   gabapentin  (NEURONTIN ) 100 MG capsule Please take 2 capsules (200 mg) after dialysis on dialysis days only Tuesday, Thursday, Saturday.   hydrOXYzine  (ATARAX ) 25 MG tablet Take 1 tablet (25 mg total) by mouth 2 (two) times daily.   naloxone  (NARCAN ) nasal spray 4 mg/0.1 mL Place 1 spray into the nose daily as needed (for overdose).   Oxycodone   HCl 10 MG TABS Take 1 tablet (10 mg total) by mouth 3 (three) times daily.   OZEMPIC, 2 MG/DOSE, 8 MG/3ML SOPN Inject 2 mg into the skin once a week.   pantoprazole  (PROTONIX ) 40 MG tablet Take 40 mg by mouth daily before breakfast.   polyethylene glycol powder (GLYCOLAX /MIRALAX ) 17 GM/SCOOP powder Dissolve 1 capful (17g) in 4-8 ounces of liquid and take by mouth daily as needed for mild constipation   pregabalin  (LYRICA ) 100 MG capsule Take 1 capsule (100 mg total) by mouth 2 (two) times daily.   senna (SENOKOT) 8.6 MG TABS tablet Take 1 tablet (8.6 mg total) by mouth 2 (two) times daily.   sertraline  (ZOLOFT ) 25 MG tablet Take 25 mg by mouth daily.   torsemide  (DEMADEX ) 100  MG tablet Take 1 tablet (100mg ) on NON-dialysis days (Sunday, Monday, Wednesday, Friday)   valACYclovir  (VALTREX ) 500 MG tablet TAKE (1) TABLET BY MOUTH EVERY OTHER DAY. (Patient taking differently: Take 500 mg by mouth every other day.)   VELPHORO  500 MG chewable tablet Take 1 tablet with meals and snacks    Allergies  Allergies as of 06/01/2024 - Review Complete 06/01/2024  Allergen Reaction Noted   Shellfish allergy Anaphylaxis and Swelling 05/14/2012   Diazepam  Other (See Comments) 10/07/2015   Morphine  Itching and Other (See Comments) 05/14/2012    Past Medical History Anemia of ESRD Aortic stenosis Arthritis Asthma CAD s/p DES in 2018 Combined systolic and diastolic heart failure ESRD on HD Depression T2DM HLD GERD Gout HTN Obesity MI   Past Surgical History Past Surgical History:  Procedure Laterality Date   A/V FISTULAGRAM Left 04/11/2019   Procedure: A/V FISTULAGRAM;  Surgeon: Jama Cordella MATSU, MD;  Location: [facility] INVASIVE CV LAB;  Service: Cardiovascular;  Laterality: Left;   A/V FISTULAGRAM Left 06/02/2019   Procedure: A/V FISTULAGRAM;  Surgeon: Jama Cordella MATSU, MD;  Location: [facility];  Service: Cardiovascular;  Laterality: Left;   AMPUTATION Left 10/28/2022   Procedure: LEFT FOOT 1ST [place] AMPUTATION;  Surgeon: Harden Jerona GAILS, MD;  Location: [facility] OR;  Service: Orthopedics;  Laterality: Left;   APPLICATION OF WOUND VAC Left 08/14/2017   Procedure: APPLICATION OF WOUND VAC Exchange;  Surgeon: Dessa Reyes ORN, MD;  Location: [facility] ORS;  Service: General;  Laterality: Left;   APPLICATION OF WOUND VAC Left 12/21/2018   Procedure: APPLICATION OF WOUND VAC;  Surgeon: Jama Cordella MATSU, MD;  Location: [facility] ORS;  Service: Vascular;  Laterality: Left;   AV FISTULA PLACEMENT Left 08/19/2018   Procedure: ARTERIOVENOUS (AV) FISTULA CREATION ( BRACHIOBASILIC );  Surgeon: Jama Cordella MATSU, MD;  Location: [facility] ORS;  Service: Vascular;  Laterality: Left;    BASCILIC VEIN TRANSPOSITION Left 11/18/2018   Procedure: BASCILIC VEIN TRANSPOSITION;  Surgeon: Jama Cordella MATSU, MD;  Location: [facility] ORS;  Service: Vascular;  Laterality: Left;   BIOPSY  09/20/2020   Procedure: BIOPSY;  Surgeon: Rollin Dover, MD;  Location: [facility] ENDOSCOPY;  Service: Endoscopy;;   CHOLECYSTECTOMY     COLONOSCOPY WITH PROPOFOL  N/A 02/03/2018   Procedure: COLONOSCOPY WITH PROPOFOL ;  Surgeon: Unk Corinn Skiff, MD;  Location: [facility] ENDOSCOPY;  Service: Gastroenterology;  Laterality: N/A;   COLONOSCOPY WITH PROPOFOL  N/A 07/25/2022   Procedure: COLONOSCOPY WITH PROPOFOL ;  Surgeon: Maryruth Ole DASEN, MD;  Location: [facility] ENDOSCOPY;  Service: Endoscopy;  Laterality: N/A;   CORONARY ANGIOPLASTY  07/2015   STENT   CORONARY STENT INTERVENTION N/A 09/18/2016   Procedure: Coronary Stent Intervention;  Surgeon: Debby DELENA Sor, MD;  Location: [facility];  Service: Cardiovascular;  Laterality: N/A;   DIALYSIS/PERMA CATHETER INSERTION N/A 05/10/2018   Procedure: DIALYSIS/PERMA CATHETER INSERTION;  Surgeon: Jama Cordella MATSU, MD;  Location: [facility];  Service: Cardiovascular;  Laterality: N/A;   DIALYSIS/PERMA CATHETER INSERTION N/A 01/21/2024   Procedure: DIALYSIS/PERMA CATHETER INSERTION;  Surgeon: Lanis Fonda BRAVO, MD;  Location: [facility];  Service: Cardiovascular;  Laterality: N/A;   DRESSING CHANGE UNDER ANESTHESIA Left 08/15/2017   Procedure: exploration of wound for bleeding;  Surgeon: Dessa Reyes ORN, MD;  Location: [facility] ORS;  Service: General;  Laterality: Left;   ESOPHAGOGASTRODUODENOSCOPY (EGD) WITH PROPOFOL  N/A 02/03/2018   Procedure: ESOPHAGOGASTRODUODENOSCOPY (EGD) WITH PROPOFOL ;  Surgeon: Unk Corinn Skiff, MD;  Location: [facility] ENDOSCOPY;  Service: Gastroenterology;  Laterality: N/A;   ESOPHAGOGASTRODUODENOSCOPY (EGD) WITH PROPOFOL  N/A 09/20/2020   Procedure: ESOPHAGOGASTRODUODENOSCOPY (EGD) WITH PROPOFOL ;  Surgeon: Rollin Dover, MD;  Location: [facility] ENDOSCOPY;   Service: Endoscopy;  Laterality: N/A;   ESOPHAGOGASTRODUODENOSCOPY (EGD) WITH PROPOFOL  N/A 07/25/2022   Procedure: ESOPHAGOGASTRODUODENOSCOPY (EGD) WITH PROPOFOL ;  Surgeon: Maryruth Ole DASEN, MD;  Location: [facility] ENDOSCOPY;  Service: Endoscopy;  Laterality: N/A;   EYE SURGERY  11/17/2018   INCISION AND DRAINAGE ABSCESS Left 08/12/2017   Procedure: INCISION AND DRAINAGE ABSCESS;  Surgeon: Dessa Reyes ORN, MD;  Location: [facility] ORS;  Service: General;  Laterality: Left;   KNEE ARTHROSCOPY     LEFT HEART CATH N/A 09/18/2016   Procedure: Left Heart Cath;  Surgeon: Debby DELENA Sor, MD;  Location: [facility];  Service: Cardiovascular;  Laterality: N/A;   LEFT HEART CATH AND CORONARY ANGIOGRAPHY N/A 09/16/2016   Procedure: Left Heart Cath and Coronary Angiography;  Surgeon: Lonni JONETTA Cash, MD;  Location: [facility] INVASIVE CV LAB;  Service: Cardiovascular;  Laterality: N/A;   LEFT HEART CATH AND CORONARY ANGIOGRAPHY N/A 04/29/2017   Procedure: LEFT HEART CATH AND CORONARY ANGIOGRAPHY;  Surgeon: Mady Lonni, MD;  Location: [facility];  Service: Cardiovascular;  Laterality: N/A;   LOWER EXTREMITY ANGIOGRAPHY Right 03/08/2018   Procedure: LOWER EXTREMITY ANGIOGRAPHY;  Surgeon: Jama Cordella MATSU, MD;  Location: [facility];  Service: Cardiovascular;  Laterality: Right;   PERIPHERAL VASCULAR THROMBECTOMY  01/21/2024   Procedure: PERIPHERAL VASCULAR THROMBECTOMY;  Surgeon: Lanis Fonda BRAVO, MD;  Location: [facility];  Service: Cardiovascular;;   TUBAL LIGATION     TUBAL LIGATION     UPPER EXTREMITY ANGIOGRAPHY Right 09/19/2019   Procedure: UPPER EXTREMITY ANGIOGRAPHY;  Surgeon: Jama Cordella MATSU, MD;  Location: [facility];  Service: Cardiovascular;  Laterality: Right;   VENOUS STENT  01/21/2024   Procedure: VENOUS STENT;  Surgeon: Lanis Fonda BRAVO, MD;  Location: [facility];  Service: Cardiovascular;;   WOUND DEBRIDEMENT Left 12/21/2018   Procedure: DEBRIDEMENT WOUND;   Surgeon: Jama Cordella MATSU, MD;  Location: [facility] ORS;  Service: Vascular;  Laterality: Left;   WOUND DEBRIDEMENT Left 12/30/2018   Procedure: DEBRIDEMENT WOUND WITH VAC PLACEMENT (LEFT UPPER EXTREMITY);  Surgeon: Jama Cordella MATSU, MD;  Location: [facility] ORS;  Service: Vascular;  Laterality: Left;    Social History  Living Situation: Lives with sister in [place] Occupation: Retired Support: Sister and daughter Level of Function: Independent, able to complete all ADLs and IADLs PCP: Pcp, No  Substances: -Tobacco: Denies current use -Alcohol: Denies current use -Recreational Drug: Denies current use  Family History  Family History  Problem Relation Age of Onset   Colon cancer Mother    Heart  attack Other    Heart attack Maternal Grandmother    Hypertension Sister    Hypertension Brother    Diabetes Paternal Grandmother    Breast cancer Neg Hx      Review of Systems  A complete ROS was negative except as per HPI.   OBJECTIVE:   Physical Exam: Blood pressure (!) 118/47, pulse 70, temperature 97.9 F (36.6 C), temperature source Oral, resp. rate 16, height 5' 5 (1.651 m), weight 104.3 kg, SpO2 96%.  Constitutional: Chronically ill-appearing obese woman in no acute distress HENT: normocephalic atraumatic, mucous membranes moist Eyes: conjunctiva non-erythematous, PERRL, no scleral icterus Cardiovascular: regular rate and rhythm, no m/r/g Pulmonary/Chest: normal work of breathing on room air, groaning during lung auscultation, otherwise clear bilaterally Abdominal: soft, non-tender, non-distended, bowel sounds normal MSK: normal bulk and tone, diffusely TTP of lumbar and thoracic spine including paraspinal muscles, no palpable step-offs or deformities Neurological: alert & oriented to person, place, season, year; moving all extremities equally Skin: warm and dry Extremities: No BLE edema, no cyanosis; peripheral pulses intact Psych: normal mood and affect, thought content  normal  Labs: CBC    Component Value Date/Time   WBC 4.8 06/01/2024 1219   RBC 3.55 (L) 06/01/2024 1219   HGB 9.6 (L) 06/01/2024 1219   HGB 9.7 (L) 09/27/2020 1417   HCT 31.9 (L) 06/01/2024 1219   HCT 28.9 (L) 09/27/2020 1417   PLT 139 (L) 06/01/2024 1219   PLT 209 09/27/2020 1417   MCV 89.9 06/01/2024 1219   MCV 89 09/27/2020 1417   MCH 27.0 06/01/2024 1219   MCHC 30.1 06/01/2024 1219   RDW 17.2 (H) 06/01/2024 1219   RDW 14.5 09/27/2020 1417   LYMPHSABS 1.4 06/01/2024 1219   LYMPHSABS 1.5 09/27/2020 1417   MONOABS 0.6 06/01/2024 1219   EOSABS 0.2 06/01/2024 1219   EOSABS 0.2 09/27/2020 1417   BASOSABS 0.0 06/01/2024 1219   BASOSABS 0.0 09/27/2020 1417     CMP     Component Value Date/Time   NA 133 (L) 06/01/2024 1219   NA 139 09/27/2020 1417   K 3.9 06/01/2024 1219   CL 90 (L) 06/01/2024 1219   CO2 27 06/01/2024 1219   GLUCOSE 121 (H) 06/01/2024 1219   BUN 28 (H) 06/01/2024 1219   BUN 9 09/27/2020 1417   CREATININE 7.48 (H) 06/01/2024 1219   CREATININE 1.36 (H) 09/23/2016 1451   CALCIUM  8.0 (L) 06/01/2024 1219   PROT 6.7 06/01/2024 1219   PROT 6.8 09/27/2020 1417   ALBUMIN  3.1 (L) 06/01/2024 1219   ALBUMIN  4.0 09/27/2020 1417   AST 15 06/01/2024 1219   ALT 17 06/01/2024 1219   ALKPHOS 171 (H) 06/01/2024 1219   BILITOT 0.3 06/01/2024 1219   BILITOT 0.4 09/27/2020 1417   GFRNONAA 6 (L) 06/01/2024 1219   GFRNONAA 43 (L) 09/23/2016 1451   GFRAA 6 (L) 01/31/2020 1858   GFRAA 49 (L) 09/23/2016 1451    Imaging: DG Chest Port 1 View Result Date: 06/01/2024 EXAM: 1 VIEW(S) XRAY OF THE CHEST 06/01/2024 12:11:00 PM COMPARISON: 05/08/2024 CLINICAL HISTORY: Questionable sepsis - evaluate for abnormality FINDINGS: LUNGS AND PLEURA: Low lung volumes. Linear opacities in right lung base. Trace right pleural effusion. No pneumothorax. HEART AND MEDIASTINUM: Aortic atherosclerotic calcification. Coronary artery stent noted. BONES AND SOFT TISSUES: No acute osseous  abnormality. IMPRESSION: 1. Linear opacities in the right lung base and trace right pleural effusion, possibly related to atelectasis. Electronically signed by: Oneil Devonshire MD 06/01/2024 12:55 PM EST  RP Workstation: [id number]    EKG: personally reviewed my interpretation is sinus rhythm with prolonged QTc. Similar to prior EKG.  ASSESSMENT & PLAN:   Assessment & Plan by Problem: Principal Problem:   Altered mental state   [name] is a female living with a PMHx of ESRD on HD (TThSa), CAD s/p PCI (2018), moderate to severe AS, HFpEF, T2DM, chronic pain due to knee OA and lower back pain, asthma, obesity, HTN, HLD, neuropathy, who is being admitted for further workup of AMS.  #AMS #Polypharmacy Patient with no new recent medications, however does have history of ESRD and is taking several centrally acting medications for back pain and neuropathy including oxycodone , gabapentin , pregabalin .  Patient is alert and oriented to name, place, situation, season, year.  She does appear tired.  Suspect her this is likely due to combination of medications and ESRD. Not hypoglycemic or uremic.  Do not suspect any neurologic process.  Plan for dialysis tonight and to opt for conservative management of pain while holding centrally acting medications.  Will reassess patient in the morning.  Should likely follow-up with pain clinic in outpatient setting to optimize pain regimen. - Hold oxycodone , gabapentin , pregabalin  - Dialysis tonight per Nephrology  #ESRD on HD TThS #History of hyperphosphatemia Missed hemodialysis session earlier this morning.  Creatinine with expected rise.  Nephrology was consulted and is following the patient.  Previous history of hyperphosphatemia and patient's medication regimen includes phosphate binder (Velphoro ).  Will check phosphorus with morning labs. - Nephrology Recs:  - HD on 06/01/2024  - Hold home BP meds - Small fluid bolus NS 250 cc as needed  #Acute on chronic  back pain #Bilateral knee OA #Neuropathy Follows with [facility] for bilateral knee OA and chronic lower back pain.  Home pain regimen includes Oxycodone  10 mg 3 times daily, but also has gabapentin  and pregabalin  for neuropathy.  MRI of lumbar spine in September 2025 showed DDD and severe facet arthrosis notably at L5-S1, findings correlating to mild radiculopathy.  No acute findings.  On exam today, the patient has tenderness to palpation of lumbar spine as well as paraspinal muscles without palpable deformities.  Suspect patient's pain is likely due to OA or MSK related pain.  Given patient's AMS and suspected medication-induced encephalopathy, we will treat pain conservatively and consider adding oxycodone  back. - Tylenol  650 mg every 6 hours as needed p.o. or suppository - Capsaicin  cream as needed - Voltaren  gel 2 g 4 times daily - Lidocaine  patch as needed - Lidocaine  cream 3 times daily as needed  #Nausea/Vomiting #Decreased Appetite Patient reports decreased appetite over the past few days due to nausea/vomiting that has occurred during dialysis.  Albumin  slightly decreased at 3.1 with normal total protein.  Unclear etiology of nausea/vomiting.  May be in the setting of medications including Ozempic, phosphate binders.  Will add protein supplementation and encourage p.o. intake. - Ensure supplements  #Anemia of ESRD Hemoglobin stable at 9.6 compared to values during previous admission. No bleeding concerns. Will continue to monitor with serial CBCs while admitted. - Continue to monitor  #Hypotension #History of Hypertension Per EMS, BP was originally 80/50 which responded to fluids and is now 110/50.  Will hold home antihypertensives and consider giving small fluid bolus 250 cc per nephrology. - Hold home amlodipine , carvedilol , and torsemide  - 250cc NS bolus if BP drops  #Hyponatremia Mildly hyponatremic with sodium of 133 on admission.  Suspect this is due to hypovolemia in the  setting of  poor p.o. intake.  Appears slightly dry on exam.  Will continue to monitor with increased oral fluids. May require fluids due to hypotension. - Trend BMPs  #T2DM Initial CBG of 115.  Last A1c of 7.0% on 03/03/2024.  Home medication regimen includes Ozempic 2 mg weekly, but do not see any other antihyperglycemics on her medication list. - Hold home Ozempic - SSI while inpatient  #Combined Systolic and Diastolic HF #Aortic Stenosis #Mitral Stenosis Last echo on 03/04/2024 showing LVEF of 65-70% with grade 1 diastolic dysfunction.  Moderate dilation of LA.  Mitral valve degenerative with moderate mitral stenosis.  Moderate to severe aortic valve stenosis.  Current medications include carvedilol  12.5 mg twice daily, torsemide  100 mg on non-HD days.  Will hold these medications as BP has been soft.  CXR without signs of volume overload.  Patient appears volume down on exam. - Hold home carvedilol  and torsemide    Stable Medical Problems  #COPD - Continue Breo Ellipta  inhaler 1 puff daily  #GERD - Continue Protonix  40 mg EC before breakfast  #Constipation - Continue home MiraLAX  and senna  #HLD - Continue Lipitor  80 mg qhs  #Anxiety #Depression - Continue home sertraline  25mg  daily - Continue Atarax  25mg  tid prn   Best practice: Diet: Carb/Renal VTE: Heparin  IVF: None,None Code: Full  Disposition planning: Prior to Admission Living Arrangement: SNF Anticipated Discharge Location: SNF  Dispo: Admit patient to Observation with expected length of stay less than 2 midnights.  Signed: Francy Mcilvaine, MD  IM  PGY-1 06/01/2024, 6:53 PM  Please contact IM Residency On-Call Pager at: [phone] or [phone].

## 2024-06-01 NOTE — ED Triage Notes (Signed)
 PT BIB [facility] from [facility] and was found to be hypotensive prior to scheduled dialysis this morning. PT states she has been feeling lethargic since last Tue dialysis which she completed. EMS state PT has had to be sternal rubbed to alertness at one point. 80/50 BP at [facility]. 500 NS bolus en route. PT aox4 at arrival. Endorses back pain on ambulation. Wheelchair for current ambulation.     [facility] VS: T 98.0, Spo2 99%, BP 104/63, P 70

## 2024-06-01 NOTE — ED Provider Notes (Signed)
 I took over care of this patient at 3:00 PM. She did not go to dialysis today as she was very tired. Appears to have some fluid on her chest xray and effusion.  Physical Exam  BP 115/71 (BP Location: Right Arm)   Pulse 70   Temp 97.9 F (36.6 C) (Oral)   Resp 14   Ht 5' 5 (1.651 m)   Wt 104.3 kg   SpO2 100%   BMI 38.27 kg/m   Physical Exam Vitals and nursing note reviewed.  Constitutional:      General: She is not in acute distress.    Appearance: She is well-developed.  HENT:     Head: Normocephalic and atraumatic.  Eyes:     Conjunctiva/sclera: Conjunctivae normal.  Cardiovascular:     Rate and Rhythm: Normal rate and regular rhythm.     Heart sounds: No murmur heard. Pulmonary:     Effort: Pulmonary effort is normal. No respiratory distress.     Breath sounds: Normal breath sounds.  Abdominal:     Palpations: Abdomen is soft.     Tenderness: There is no abdominal tenderness.  Musculoskeletal:        General: No swelling.     Cervical back: Neck supple.  Skin:    General: Skin is warm and dry.     Capillary Refill: Capillary refill takes less than 2 seconds.  Neurological:     Mental Status: She is alert.  Psychiatric:        Mood and Affect: Mood normal.     Procedures  Procedures  ED Course / MDM    Medical Decision Making Spoke with IM resident, Dr. Harrie and they will admit patient to the IM service for further workup and management. Nephrology will plan for patient to have dialysis. Patient is agreeable with plan for admission.   Problems Addressed: Encephalopathy acute: undiagnosed new problem with uncertain prognosis End stage renal disease (HCC): chronic illness or injury  Amount and/or Complexity of Data Reviewed Labs: ordered. Radiology: ordered.  Risk Decision regarding hospitalization.          Gennaro Duwaine CROME, DO 06/01/24 1757

## 2024-06-01 NOTE — ED Provider Notes (Signed)
 [place] EMERGENCY DEPARTMENT AT [facility] Provider Note   CSN: [id number] Arrival date & time: 06/01/24  1154     Patient presents with: Near Syncope   [name] is a 68 y.o. female.   HPI Patient with end-stage renal disease, obesity, CHF presents from nursing facility with concern for weakness, hypotension. Today's dialysis today, patient was reportedly too weak to go. Reported the patient complained of back pain, weakness, was found to be hypotensive at her facility prompting ED transport. History is obtained by the EMS individuals as the patient is hypersomnolent, awake and to stimuli, falls asleep again quickly.     Prior to Admission medications   Medication Sig Start Date End Date Taking? Authorizing Provider  albuterol  (VENTOLIN  HFA) 108 (90 Base) MCG/ACT inhaler Inhale 2 puffs into the lungs every 4 (four) hours as needed for wheezing or shortness of breath. 06/28/20  Yes Vivienne Delon CHRISTELLA, PA-C  amLODipine  (NORVASC ) 10 MG tablet Take 10 mg by mouth daily.   Yes [provider]  aspirin  81 MG EC tablet Take 1 tablet (81 mg total) by mouth daily. 09/20/20  Yes Singh, Prashant K, MD  atorvastatin  (LIPITOR ) 80 MG tablet Take 1 tablet (80 mg total) by mouth every evening. Patient taking differently: Take 80 mg by mouth at bedtime. 04/24/21  Yes Bacigalupo, Angela M, MD  carvedilol  (COREG ) 12.5 MG tablet Take 1 tablet (12.5 mg total) by mouth 2 (two) times daily with a meal. 09/19/21  Yes Paige, Victoria J, DO  diclofenac  Sodium (VOLTAREN ) 1 % GEL Apply 2 g topically in the morning, at noon, and at bedtime. Left and right knee   Yes [provider]  fluticasone  (FLONASE ) 50 MCG/ACT nasal spray Place 2 sprays into both nostrils daily as needed for allergies or rhinitis. 09/28/17  Yes Vivienne Delon CHRISTELLA, PA-C  fluticasone  furoate-vilanterol (BREO ELLIPTA ) 200-25 MCG/INH AEPB Inhale 1 puff into the lungs daily. 08/30/20  Yes Vivienne Delon  M, PA-C  gabapentin  (NEURONTIN ) 100 MG capsule Please take 2 capsules (200 mg) after dialysis on dialysis days only Tuesday, Thursday, Saturday. 03/09/24  Yes Everhart, Kirstie, DO  hydrOXYzine  (ATARAX ) 25 MG tablet Take 1 tablet (25 mg total) by mouth 2 (two) times daily. 11/17/22  Yes Hongalgi, Anand D, MD  naloxone  (NARCAN ) nasal spray 4 mg/0.1 mL Place 1 spray into the nose daily as needed (for overdose). 04/08/22  Yes [provider]  Oxycodone  HCl 10 MG TABS Take 1 tablet (10 mg total) by mouth 3 (three) times daily. 05/12/24  Yes Smucker, Nathanael, MD  OZEMPIC, 2 MG/DOSE, 8 MG/3ML SOPN Inject 2 mg into the skin once a week.   Yes [provider]  pantoprazole  (PROTONIX ) 40 MG tablet Take 40 mg by mouth daily before breakfast.   Yes [provider]  polyethylene glycol powder (GLYCOLAX /MIRALAX ) 17 GM/SCOOP powder Dissolve 1 capful (17g) in 4-8 ounces of liquid and take by mouth daily as needed for mild constipation 03/30/24  Yes Janna Ferrier, DO  pregabalin  (LYRICA ) 100 MG capsule Take 1 capsule (100 mg total) by mouth 2 (two) times daily. 05/12/24  Yes Smucker, Melvenia, MD  senna (SENOKOT) 8.6 MG TABS tablet Take 1 tablet (8.6 mg total) by mouth 2 (two) times daily. 03/30/24  Yes Janna Ferrier, DO  sertraline (ZOLOFT) 25 MG tablet Take 25 mg by mouth daily. 05/22/24  Yes [provider]  torsemide  (DEMADEX ) 100 MG tablet Take 1 tablet (100mg ) on NON-dialysis days (Sunday, Monday, Wednesday,  Friday) 03/08/24  Yes Everhart, Kirstie, DO  valACYclovir  (VALTREX ) 500 MG tablet TAKE (1) TABLET BY MOUTH EVERY OTHER DAY. Patient taking differently: Take 500 mg by mouth every other day. 01/24/21  Yes Bacigalupo, Jon HERO, MD  VELPHORO  500 MG chewable tablet Take 1 tablet with meals and snacks 05/12/24  Yes Smucker, Nathanael, MD  ammonium lactate (LAC-HYDRIN) 12 % lotion Apply 1 Application topically daily as needed for dry skin. Patient not taking: Reported on  06/01/2024 12/15/23   [provider]  cinacalcet  (SENSIPAR ) 30 MG tablet Take 30 mg by mouth daily. Patient not taking: Reported on 06/01/2024 04/11/24   [provider]  lidocaine  (LIDODERM ) 5 % Place 1 patch onto the skin daily. Remove & Discard patch within 12 hours or as directed by MD Patient not taking: Reported on 06/01/2024 06/25/23   Palumbo, April, MD  nitroGLYCERIN  (NITROSTAT ) 0.4 MG SL tablet Place 1 tablet (0.4 mg total) under the tongue every 5 (five) minutes x 3 doses as needed for chest pain. Patient not taking: Reported on 06/01/2024 03/08/24   Howell Lunger, DO  tiZANidine  (ZANAFLEX ) 4 MG tablet Take 1 tablet (4 mg total) by mouth daily as needed for muscle spasms. Patient not taking: Reported on 06/01/2024 03/08/24   Everhart, Kirstie, DO    Allergies: Shellfish allergy, Diazepam , and Morphine     Review of Systems  Updated Vital Signs BP (!) 118/47   Pulse 70   Temp 97.9 F (36.6 C) (Oral)   Resp 16   Ht 1.651 m (5' 5)   Wt 104.3 kg   SpO2 96%   BMI 38.27 kg/m   Physical Exam Vitals and nursing note reviewed.  Constitutional:      General: She is not in acute distress.    Appearance: She is well-developed. She is obese.  HENT:     Head: Normocephalic and atraumatic.  Eyes:     Conjunctiva/sclera: Conjunctivae normal.  Cardiovascular:     Rate and Rhythm: Normal rate and regular rhythm.  Pulmonary:     Effort: Pulmonary effort is normal. No respiratory distress.     Breath sounds: No stridor.  Abdominal:     General: There is no distension.  Skin:    General: Skin is warm and dry.  Neurological:     Mental Status: She is alert.     Cranial Nerves: No cranial nerve deficit.     (all labs ordered are listed, but only abnormal results are displayed) Labs Reviewed  COMPREHENSIVE METABOLIC PANEL WITH GFR - Abnormal; Notable for the following components:      Result Value   Sodium 133 (*)    Chloride 90 (*)    Glucose, Bld 121 (*)     BUN 28 (*)    Creatinine, Ser 7.48 (*)    Calcium  8.0 (*)    Albumin  3.1 (*)    Alkaline Phosphatase 171 (*)    GFR, Estimated 6 (*)    Anion gap 16 (*)    All other components within normal limits  CBC WITH DIFFERENTIAL/PLATELET - Abnormal; Notable for the following components:   RBC 3.55 (*)    Hemoglobin 9.6 (*)    HCT 31.9 (*)    RDW 17.2 (*)    Platelets 139 (*)    All other components within normal limits  CBG MONITORING, ED - Abnormal; Notable for the following components:   Glucose-Capillary 115 (*)    All other components within normal limits  PROTIME-INR  BRAIN NATRIURETIC  PEPTIDE  I-STAT CG4 LACTIC ACID, ED  I-STAT CG4 LACTIC ACID, ED    EKG: EKG Interpretation Date/Time:  Thursday June 01 2024 12:01:02 EST Ventricular Rate:  72 PR Interval:  147 QRS Duration:  105 QT Interval:  457 QTC Calculation: 501 R Axis:   67  Text Interpretation: Sinus rhythm Borderline repolarization abnormality Prolonged QT interval No significant change since last tracing Confirmed by Garrick Charleston [phone]) on 06/01/2024 1:07:33 PM  Radiology: [facility] Chest Port 1 View Result Date: 06/01/2024 EXAM: 1 VIEW(S) XRAY OF THE CHEST 06/01/2024 12:11:00 PM COMPARISON: 05/08/2024 CLINICAL HISTORY: Questionable sepsis - evaluate for abnormality FINDINGS: LUNGS AND PLEURA: Low lung volumes. Linear opacities in right lung base. Trace right pleural effusion. No pneumothorax. HEART AND MEDIASTINUM: Aortic atherosclerotic calcification. Coronary artery stent noted. BONES AND SOFT TISSUES: No acute osseous abnormality. IMPRESSION: 1. Linear opacities in the right lung base and trace right pleural effusion, possibly related to atelectasis. Electronically signed by: Oneil Devonshire MD 06/01/2024 12:55 PM EST RP Workstation: [id number]     Procedures   Medications Ordered in the ED - No data to display                                  Medical Decision Making Elderly obese female with end-stage  renal disease, multiple other medical problems, high risk profile for encephalopathy, sepsis, presents with altered mental status, via EMS.  Amount and/or Complexity of Data Reviewed Independent Historian: EMS External Data Reviewed: notes. Labs: ordered. Decision-making details documented in ED Course. Radiology: ordered and independent interpretation performed. Decision-making details documented in ED Course. ECG/medicine tests: ordered and independent interpretation performed. Decision-making details documented in ED Course.  Risk Prescription drug management. Decision regarding hospitalization. Diagnosis or treatment significantly limited by social determinants of health.   Update: Patient's labs consistent with known renal disease, x-Kamm with fluid overload status. Given her sleepiness, fluid overload status I discussed her case with our nephrology team, patient in queue for dialysis.  Patient's other initial findings are generally reassuring, no lactic acidosis, leukocytosis, anemia, no substantial electrolyte abnormalities. Patient will require admission, on signout patient awaiting callback from internal medicine.     Final diagnoses:  Encephalopathy acute    ED Discharge Orders     None          Garrick Charleston, MD 06/01/24 1614

## 2024-06-02 ENCOUNTER — Other Ambulatory Visit (HOSPITAL_COMMUNITY): Payer: Self-pay

## 2024-06-02 DIAGNOSIS — Z7985 Long-term (current) use of injectable non-insulin antidiabetic drugs: Secondary | ICD-10-CM

## 2024-06-02 DIAGNOSIS — Z992 Dependence on renal dialysis: Secondary | ICD-10-CM | POA: Diagnosis not present

## 2024-06-02 DIAGNOSIS — N186 End stage renal disease: Secondary | ICD-10-CM | POA: Diagnosis not present

## 2024-06-02 DIAGNOSIS — G9340 Encephalopathy, unspecified: Secondary | ICD-10-CM

## 2024-06-02 DIAGNOSIS — E1122 Type 2 diabetes mellitus with diabetic chronic kidney disease: Secondary | ICD-10-CM | POA: Diagnosis not present

## 2024-06-02 DIAGNOSIS — Z79899 Other long term (current) drug therapy: Secondary | ICD-10-CM

## 2024-06-02 DIAGNOSIS — I5040 Unspecified combined systolic (congestive) and diastolic (congestive) heart failure: Secondary | ICD-10-CM

## 2024-06-02 LAB — CBC
HCT: 33.1 % — ABNORMAL LOW (ref 36.0–46.0)
Hemoglobin: 9.9 g/dL — ABNORMAL LOW (ref 12.0–15.0)
MCH: 27 pg (ref 26.0–34.0)
MCHC: 29.9 g/dL — ABNORMAL LOW (ref 30.0–36.0)
MCV: 90.4 fL (ref 80.0–100.0)
Platelets: 160 K/uL (ref 150–400)
RBC: 3.66 MIL/uL — ABNORMAL LOW (ref 3.87–5.11)
RDW: 17.3 % — ABNORMAL HIGH (ref 11.5–15.5)
WBC: 4.2 K/uL (ref 4.0–10.5)
nRBC: 0 % (ref 0.0–0.2)

## 2024-06-02 LAB — BASIC METABOLIC PANEL WITH GFR
Anion gap: 13 (ref 5–15)
BUN: 15 mg/dL (ref 8–23)
CO2: 27 mmol/L (ref 22–32)
Calcium: 8.2 mg/dL — ABNORMAL LOW (ref 8.9–10.3)
Chloride: 93 mmol/L — ABNORMAL LOW (ref 98–111)
Creatinine, Ser: 4.57 mg/dL — ABNORMAL HIGH (ref 0.44–1.00)
GFR, Estimated: 10 mL/min — ABNORMAL LOW (ref >60)
Glucose, Bld: 135 mg/dL — ABNORMAL HIGH (ref 70–99)
Potassium: 3.7 mmol/L (ref 3.5–5.1)
Sodium: 133 mmol/L — ABNORMAL LOW (ref 135–145)

## 2024-06-02 LAB — PHOSPHORUS: Phosphorus: 4.2 mg/dL (ref 2.5–4.6)

## 2024-06-02 LAB — CBG MONITORING, ED: Glucose-Capillary: 173 mg/dL — ABNORMAL HIGH (ref 70–99)

## 2024-06-02 MED ORDER — SODIUM CHLORIDE 0.9 % IV BOLUS
250.0000 mL | Freq: Once | INTRAVENOUS | Status: AC
  Administered 2024-06-02: 250 mL via INTRAVENOUS

## 2024-06-02 MED ORDER — MIDODRINE HCL 5 MG PO TABS: 10.0000 mg | ORAL_TABLET | Freq: Three times a day (TID) | ORAL | Status: DC

## 2024-06-02 MED ORDER — FLUTICASONE FUROATE-VILANTEROL 100-25 MCG/ACT IN AEPB
1.0000 | INHALATION_SPRAY | Freq: Every day | RESPIRATORY_TRACT | Status: DC
  Filled 2024-06-02: qty 28

## 2024-06-02 MED ORDER — MIDODRINE HCL 10 MG PO TABS
10.0000 mg | ORAL_TABLET | Freq: Three times a day (TID) | ORAL | 0 refills | Status: DC
  Filled 2024-06-02: qty 90, 30d supply, fill #0

## 2024-06-02 NOTE — Discharge Summary (Signed)
 Name: [name] MRN: [id number] DOB: August 03, 1955 68 y.o. PCP: Pcp, No  Date of Admission: 06/01/2024 11:54 AM Date of Discharge: 06/02/2024  Attending Physician: Dr. Jone Dauphin  Discharge Diagnosis: Principal Problem:   Altered mental state ESRD on HD TTS CAD s/p DES 2018 Polypharmacy Neuropathic pain Bilateral knee OA Acute on chronic back pain Decreased appetite Anemia of ESRD Hypertension Hyponatremia T2DM Combined systolic and diastolic heart failure Aortic stenosis Mitral stenosis COPD GERD Constipation HLD Anxiety Depression  Discharge Medications: Allergies as of 06/02/2024       Reactions   Shellfish Allergy Anaphylaxis, Swelling   Diazepam  Other (See Comments)   felt like out of body experience   Morphine  Itching, Other (See Comments)   Tolerated hydromorphone  on 07/2020 *will take along with Benadryl *        Medication List     STOP taking these medications    amLODipine  10 MG tablet Commonly known as: NORVASC    carvedilol  12.5 MG tablet Commonly known as: COREG    gabapentin  100 MG capsule Commonly known as: NEURONTIN    tiZANidine  4 MG tablet Commonly known as: ZANAFLEX        TAKE these medications    albuterol  108 (90 Base) MCG/ACT inhaler Commonly known as: VENTOLIN  HFA Inhale 2 puffs into the lungs every 4 (four) hours as needed for wheezing or shortness of breath.   aspirin  EC 81 MG tablet Take 1 tablet (81 mg total) by mouth daily.   atorvastatin  80 MG tablet Commonly known as: LIPITOR  Take 1 tablet (80 mg total) by mouth every evening. What changed: when to take this   cinacalcet  30 MG tablet Commonly known as: SENSIPAR  Take 30 mg by mouth daily.   diclofenac  Sodium 1 % Gel Commonly known as: VOLTAREN  Apply 2 g topically in the morning, at noon, and at bedtime. Left and right knee   fluticasone  50 MCG/ACT nasal spray Commonly known as: FLONASE  Place 2 sprays into both nostrils daily as needed for  allergies or rhinitis.   fluticasone  furoate-vilanterol 200-25 MCG/INH Aepb Commonly known as: BREO ELLIPTA  Inhale 1 puff into the lungs daily.   hydrOXYzine  25 MG tablet Commonly known as: ATARAX  Take 1 tablet (25 mg total) by mouth 2 (two) times daily.   lidocaine  5 % Commonly known as: Lidoderm  Place 1 patch onto the skin daily. Remove & Discard patch within 12 hours or as directed by MD   midodrine  10 MG tablet Commonly known as: PROAMATINE  Take 1 tablet (10 mg total) by mouth 3 (three) times daily with meals.   naloxone  4 MG/0.1ML Liqd nasal spray kit Commonly known as: NARCAN  Place 1 spray into the nose daily as needed (for overdose).   nitroGLYCERIN  0.4 MG SL tablet Commonly known as: NITROSTAT  Place 1 tablet (0.4 mg total) under the tongue every 5 (five) minutes x 3 doses as needed for chest pain.   Oxycodone  HCl 10 MG Tabs Take 1 tablet (10 mg total) by mouth 3 (three) times daily.   Ozempic (2 MG/DOSE) 8 MG/3ML Sopn Generic drug: Semaglutide (2 MG/DOSE) Inject 2 mg into the skin once a week.   pantoprazole  40 MG tablet Commonly known as: PROTONIX  Take 40 mg by mouth daily before breakfast.   polyethylene glycol powder 17 GM/SCOOP powder Commonly known as: GLYCOLAX /MIRALAX  Dissolve 1 capful (17g) in 4-8 ounces of liquid and take by mouth daily as needed for mild constipation   pregabalin  100 MG capsule Commonly known as: LYRICA  Take 1 capsule (100 mg total)  by mouth 2 (two) times daily.   senna 8.6 MG Tabs tablet Commonly known as: SENOKOT Take 1 tablet (8.6 mg total) by mouth 2 (two) times daily.   sertraline  25 MG tablet Commonly known as: ZOLOFT  Take 25 mg by mouth daily.   torsemide  100 MG tablet Commonly known as: DEMADEX  Take 1 tablet (100mg ) on NON-dialysis days (Sunday, Monday, Wednesday, Friday)   valACYclovir 500 MG tablet Commonly known as: VALTREX TAKE (1) TABLET BY MOUTH EVERY OTHER DAY. What changed: See the new instructions.    Velphoro 500 MG chewable tablet Generic drug: sucroferric oxyhydroxide Take 1 tablet with meals and snacks               Durable Medical Equipment  (From admission, onward)           Start     Ordered   06/02/24 1102  For home use only DME Walker rolling  Once       Question Answer Comment  Walker: With 5 Inch Wheels   Patient needs a walker to treat with the following condition Debility      11 /21/25 1101            Disposition and follow-up:   Ms.Alyiah M Kalama was discharged from [facility] in Good condition.  At the hospital follow up visit please address:  1.  Follow-up:   a. AMS/Polypharmacy - patient was taking, addition of oxycodone , gabapentin , pregabalin , tizanidine .  We have discontinued the gabapentin  and the tizanidine .  Please assess whether her neuropathic pain and knee/back pain is controlled on this new regimen.  Her medication regimen may need adjustment.    b. ESRD on HD TTS - the patient missed dialysis session on Thursday morning.  Appeared euvolemic.  She received 1 dialysis session later that night.  Please ensure that she is not missing any further sessions and is on her TTS schedule.   c. Hypotension - pressures were consistently soft and she required small fluid boluses.  We have now started midodrine  10 mg 3 times daily.  Discontinued amlodipine  and carvedilol .  Please assess whether this is a good regimen for her pressures, especially during dialysis.   d.  CHF - discontinued carvedilol  due to hypotension.  Kept torsemide  on.  Please assess volume status and whether further adjustments to her medication regimen is a wrist wire.  2.  Labs / imaging needed at time of follow-up: Consider BMP to follow hyponatremia  3.  Pending labs/ test needing follow-up: None   Follow-up Appointments:   Hospital Course by problem list: #AMS #Polypharmacy Patient with no new recent medications, however does have history of ESRD and  is taking several centrally acting medications for back pain and neuropathy including oxycodone , gabapentin , pregabalin .  Patient is alert and oriented to name, place, situation, season, year.  She does appear tired and sluggish.  This improved 1 day after discharge.  Suspect this is likely due to combination of medications (oxycodone , gabapentin , pregabalin , tizanidine ) and ESRD. Not hypoglycemic or uremic.  Do not suspect any neurologic process.  We held the centrally acting medications and she received 1 session of dialysis during her admission.  Upon discharge, her gabapentin  and tizanidine  were discontinued.  Recommend that she follow-up with PCP or pain clinic for further optimization of her regimen.   #ESRD on HD TThS #History of hyperphosphatemia Missed hemodialysis session the morning of admission.  Creatinine with expected rise.  Nephrology was consulted and recommended dialysis the night of  admission which she completed.  She does have previous history of hyperphosphatemia and patient's medication regimen includes phosphate binder (Velphoro ), however the patient's phosphorus levels were within normal limits.  Due to difficulties with hypotension, the patient was started on regimen of midodrine  10 mg 3 times daily per nephrology recommendations.   #Acute on chronic back pain #Bilateral knee OA #Neuropathy Follows with [facility] for bilateral knee OA and chronic lower back pain.  Home pain regimen includes Oxycodone  10 mg 3 times daily, but also has gabapentin  and pregabalin  for neuropathy.  MRI of lumbar spine in September 2025 showed DDD and severe facet arthrosis notably at L5-S1, findings correlating to mild radiculopathy.  No acute findings.  On admission exam, the patient had tenderness to palpation of lumbar spine as well as paraspinal muscles without palpable deformities.  Suspect patient's pain is likely due to OA or MSK-related pain.  Given patient's AMS and suspected medication-induced  encephalopathy, she was treated conservatively with topicals which she declined.  Will likely need to follow-up with her orthopedic surgeon or pain clinic for further optimization of her pain.  Perhaps, this patient would benefit from nerve block or arterial embolization procedure.  She was discharged with recommendations for home health PT and OT.   #Nausea/Vomiting #Decreased Appetite Patient reported decreased appetite, N/V a few days prior to admission including during dialysis.  Albumin  slightly decreased at 3.1 with normal total protein.  Unclear etiology of nausea/vomiting.  May be in the setting of medications including Ozempic, phosphate binders.  Added Ensure protein supplementation and encouraged p.o. intake.   #Anemia of ESRD Hemoglobin stable at 9.6 compared to values during previous admission. No bleeding concerns. On day of discharge her hemoglobin was 9.9.   #Hypotension #History of Hypertension Per EMS, BP was originally 80/50 which improved with fluids to 110/50.  She did receive an additional 250cc bolus due to low pressures overnight. After discussion with nephrology, decision was made to start her on midodrine  10mg  tid. We held her antihypertensives during this admission and discontinued them on discharge.   #Hyponatremia Mildly hyponatremic with sodium of 133 on admission.  Suspect this is due to hypovolemia in the setting of poor p.o. intake.  Appeared slightly dry on exam.  Will continue to monitor with increased oral fluids and she received small fluid bolus. Sodium stable at 133 upon discharge.   #T2DM Initial CBG of 115.  Last A1c of 7.0% on 03/03/2024.  Home medication regimen includes Ozempic 2 mg weekly, but do not see any other antihyperglycemics on her medication list. We held her Ozempic and started her on SSI while inpatient.  BGs were grossly controlled.   #Combined Systolic and Diastolic HF #Aortic Stenosis #Mitral Stenosis Last echo on 03/04/2024 showing LVEF  of 65-70% with grade 1 diastolic dysfunction.  Moderate dilation of LA.  Mitral valve degenerative with moderate mitral stenosis.  Moderate to severe aortic valve stenosis.  Current medications include carvedilol  12.5 mg twice daily, torsemide  100 mg on non-HD days.  Held her carvedilol  and torsemide  during her stay.  CXR without signs of volume overload.  Patient appears volume down on exam.  We discontinued carvedilol  but kept torsemide  upon discharge.     Stable Medical Problems   #COPD We continued home Breo Ellipta  inhaler 1 puff daily.   #GERD We continued home Protonix  40 mg EC before breakfast.   #Constipation We continued home MiraLAX  and senna.   #HLD We continued Lipitor  80 mg at bedtime.   #Anxiety #Depression  We continued home sertraline  and Atarax  as needed which she did not require.   Discharge Subjective: Ms.Kristin M Vaughan reports feeling much improved since when she came in. Patient is medically ready for discharge.  Discharge Exam:   BP 111/76 (BP Location: Right Arm)   Pulse 80   Temp 97.8 F (36.6 C) (Oral)   Resp 13   Ht 5' 5 (1.651 m)   Wt 104.3 kg   SpO2 95%   BMI 38.27 kg/m  Physical Exam: Constitutional: Chronically ill-appearing obese woman in no acute distress HENT: normocephalic atraumatic, mucous membranes moist Eyes: conjunctiva non-erythematous, PERRL, no scleral icterus Cardiovascular: regular rate and rhythm, no m/r/g Pulmonary/Chest: normal work of breathing on room air, CTAB  Abdominal: soft, non-tender, non-distended, bowel sounds normal MSK: normal bulk and tone, diffusely TTP of lumbar and thoracic spine including paraspinal muscles, no palpable step-offs or deformities Neurological: alert & oriented to person, place, season, year; moving all extremities equally Skin: warm and dry Extremities: mild BLE tenderness to palpation, no edema or cyanosis; peripheral pulses intact Psych: normal mood and affect, thought content  normal   Pertinent Labs, Studies, and Procedures:     Latest Ref Rng & Units 06/02/2024    6:05 AM 06/01/2024   12:19 PM 05/12/2024    2:25 AM  CBC  WBC 4.0 - 10.5 K/uL 4.2  4.8  5.8   Hemoglobin 12.0 - 15.0 g/dL 9.9  9.6  9.3   Hematocrit 36.0 - 46.0 % 33.1  31.9  30.3   Platelets 150 - 400 K/uL 160  139  156        Latest Ref Rng & Units 06/02/2024    6:05 AM 06/01/2024   12:19 PM 05/12/2024    2:25 AM  CMP  Glucose 70 - 99 mg/dL 864  878  721   BUN 8 - 23 mg/dL 15  28  59   Creatinine 0.44 - 1.00 mg/dL 5.42  2.51  3.51   Sodium 135 - 145 mmol/L 133  133  135   Potassium 3.5 - 5.1 mmol/L 3.7  3.9  5.2   Chloride 98 - 111 mmol/L 93  90  95   CO2 22 - 32 mmol/L 27  27  22    Calcium  8.9 - 10.3 mg/dL 8.2  8.0  7.7   Total Protein 6.5 - 8.1 g/dL  6.7    Total Bilirubin 0.0 - 1.2 mg/dL  0.3    Alkaline Phos 38 - 126 U/L  171    AST 15 - 41 U/L  15    ALT 0 - 44 U/L  17      DG Chest Port 1 View Result Date: 06/01/2024 EXAM: 1 VIEW(S) XRAY OF THE CHEST 06/01/2024 12:11:00 PM COMPARISON: 05/08/2024 CLINICAL HISTORY: Questionable sepsis - evaluate for abnormality FINDINGS: LUNGS AND PLEURA: Low lung volumes. Linear opacities in right lung base. Trace right pleural effusion. No pneumothorax. HEART AND MEDIASTINUM: Aortic atherosclerotic calcification. Coronary artery stent noted. BONES AND SOFT TISSUES: No acute osseous abnormality. IMPRESSION: 1. Linear opacities in the right lung base and trace right pleural effusion, possibly related to atelectasis. Electronically signed by: Oneil Devonshire MD 06/01/2024 12:55 PM EST RP Workstation: [id number]     Discharge Instructions:   Discharge Instructions      To [name] or their caretakers,  You were recently admitted to [facility] for altered mental status and drowsiness.  We think that your drowsiness was likely caused by the  medications you are taking including oxycodone , gabapentin , pregabalin , tizanidine .  Due to  your renal failure, these medications may build up in your system until you receive dialysis. During your stay, you received 1 session of hemodialysis since you missed your Thursday morning session.  We would advise you to stop taking gabapentin  and continue taking Lyrica  or pregabalin  100 mg twice a day.  Please take this medication after your dialysis session on dialysis days.  We have also sent an order for home health PT and OT, as well as a rolling walker.  Continue taking your home medications with the following changes:  Start taking Midodrine  10 mg 3 times daily Pregabalin  (Lyrica ) 100 mg twice daily Stop taking Tizanidine  Gabapentin    You should seek further medical care if you experience worsening shortness of breath, uncontrollable nausea/vomiting, extreme dizziness/lightheadedness, chest pain, or palpitations.  Please follow up with the following doctors/specialties: Nephrology (for dialysis sessions) Orthopedic Surgery (for further treatment options regarding knee pain)  We recommend that you also see your primary care doctor in about a week to make sure that you continue to improve. We are so glad that you are feeling better.  Sincerely,  [facility]      Signed:  Letha Cheadle, MD Internal Medicine Resident, PGY-1 06/02/2024, 11:53 AM Please contact the on call pager after 5 pm and on weekends at [phone].

## 2024-06-02 NOTE — Progress Notes (Addendum)
 Physical Therapy Quick Note  PT has completed initial evaluation.    Overall, patient at [employer] assistance level.   PT Follow up recommended: Short term inpatient PT services Equipment recommended:  Rolling walker Complete evaluation note to follow.     Bernardino JINNY Ruth, PT, DPT Acute Rehabilitation Office [phone]

## 2024-06-02 NOTE — Progress Notes (Signed)
 TOC/ICM consulted for DME and HH needs. Pt is from [facility] and initially planned to dc home from the [facility] ED c/the support of her daughter. However, pt's daughter states that she currently cannot help pt because her child is sick. Pt states that her insurance has approved [facility] until 06/07/2024 and she will return there to give her daughter time to prepare for her dc home.  SW and ED staff made aware.   Rolling walker recommended at dc: Pt states she will order a rolling walker from [employer].   Current DME: cane, raised toilet seat c/rails  Dialysis Tu-Th-Sa, 1200-1600.

## 2024-06-02 NOTE — Discharge Instructions (Addendum)
 To [name] or their caretakers,  You were recently admitted to [facility] for altered mental status and drowsiness.  We think that your drowsiness was likely caused by the medications you are taking including oxycodone , gabapentin , pregabalin , tizanidine .  Due to your renal failure, these medications may build up in your system until you receive dialysis. During your stay, you received 1 session of hemodialysis since you missed your Thursday morning session.  We would advise you to stop taking gabapentin  and continue taking Lyrica  or pregabalin  100 mg twice a day.  Please take this medication after your dialysis session on dialysis days.  We have also sent an order for home health PT and OT, as well as a rolling walker.  Continue taking your home medications with the following changes:  Start taking Midodrine  10 mg 3 times daily Pregabalin  (Lyrica ) 100 mg twice daily Stop taking Tizanidine  Gabapentin    You should seek further medical care if you experience worsening shortness of breath, uncontrollable nausea/vomiting, extreme dizziness/lightheadedness, chest pain, or palpitations.  Please follow up with the following doctors/specialties: Nephrology (for dialysis sessions) Orthopedic Surgery (for further treatment options regarding knee pain)  We recommend that you also see your primary care doctor in about a week to make sure that you continue to improve. We are so glad that you are feeling better.  Sincerely,  [facility]

## 2024-06-02 NOTE — Progress Notes (Signed)
 [facility] Progress Note  Subjective:  Feels better, on c/o today  Vitals:   06/02/24 0327 06/02/24 0756 06/02/24 0945 06/02/24 1233  BP: (!) 105/36  111/76 108/62  Pulse: 74 86 80 88  Resp: 17 13 13 14   Temp: 98.3 F (36.8 C) 98.8 F (37.1 C) 97.8 F (36.6 C)   TempSrc: Oral Oral Oral   SpO2: 95% 95% 95% 94%  Weight:      Height:        Exam: Gen alert, no distress Sclera anicteric, throat clear  No jvd or bruits Chest clear bilat to bases RRR no MRG Abd soft ntnd no mass or ascites +bs Ext no LE or UE edema Neuro is alert, Ox 3 , nf    LUA AVF+bruit    Home bp meds: Norvasc  10 every day Coreg  12.5 bid Demadex  100mg  x 1 on non hd days    OP HD: TTS G-O 4.5h   B450    101.9kg   AVF  Heparin  3000 Last OP HD 11/18, post wt 102.3kg       Assessment/ Plan: Hypotension: per EMS bp's 80/50 originally, responded to fluids and now 110/50. Better today AMS: resolved , per pmd ESRD: on HD TTS. Had HD last night.  BP: bp's stable 90-110 range Anemia of esrd: Hb 9.6 Dispo: going home today      Myer Fret MD  [facility] 06/02/2024, 1:56 PM  Recent Labs  Lab 06/01/24 1219 06/02/24 0605  HGB 9.6* 9.9*  ALBUMIN  3.1*  --   CALCIUM  8.0* 8.2*  PHOS  --  4.2  CREATININE 7.48* 4.57*  K 3.9 3.7   No results for input(s): IRON , TIBC, FERRITIN in the last 168 hours. Inpatient medications:  atorvastatin   80 mg Oral QHS   Chlorhexidine  Gluconate Cloth  6 each Topical Q0600   diclofenac  Sodium  2 g Topical QID   feeding supplement  237 mL Oral BID BM   fluticasone  furoate-vilanterol  1 puff Inhalation Daily   heparin   5,000 Units Subcutaneous Q8H   insulin  aspart  0-6 Units Subcutaneous TID WC   lidocaine   1 patch Transdermal Q24H   midodrine   10 mg Oral TID WC   pantoprazole   40 mg Oral QAC breakfast   senna  1 tablet Oral BID   sertraline   25 mg Oral Daily   valACYclovir   500 mg Oral QODAY    acetaminophen  **OR** acetaminophen , capsaicin ,  hydrOXYzine , lidocaine , polyethylene glycol

## 2024-06-02 NOTE — ED Notes (Signed)
 This phlebotomist attempted to collect morning labs. Unable to collect. RN aware

## 2024-06-02 NOTE — Plan of Care (Signed)
  [facility] Patient Discharge Orders- [facility]: GOC  Patient's name: [name] Admit/DC Dates: 06/01/2024 - 06/02/2024 Obs  Discharge Diagnoses: AMS. Sedating medications held Hypotension. Midodrine  started here. Carvedilol  held.   Outpatient Dialysis Orders:  -Heparin : No change -EDW: No change   -Bath: No change   Anemia Aranesp : Given: --   Date of last dose/amount: --   PRBC's Given: -- Date/# of units: -- ESA dose for discharge: Mircera 100 mcg IV 2 wks   Recent Labs  Lab 06/01/24 1219 06/02/24 0605  HGB 9.6* 9.9*  K 3.9 3.7  CALCIUM  8.0* 8.2*  PHOS  --  4.2  ALBUMIN  3.1*  --    Access intervention/Change: ---    Medications: -IV Antibiotics:  -- -Other anticoagulation: --   OTHER/APPTS/LABS   Completed by: Maisie Ronnald Acosta PA-C   D/C Meds to be reconciled by nurse after every discharge.    Reviewed by: MD:______ RN_______

## 2024-06-02 NOTE — Evaluation (Signed)
 Physical Therapy Evaluation Patient Details Name: [name] MRN: [id number] DOB: 16-Nov-1955 Today's Date: 06/02/2024  History of Present Illness  67 y.o. female presents to [facility] hospital on 06/01/2024 from SNF with lethargy, hypotension and back pain.PMH: HFpEF, ESRD, HTN, CAD, DM  Clinical Impression  Pt presents to PT with deficits in strength, power, gait, balance, functional mobility and endurance. Pt is able to ambulate for household distances at this time, requiring increased time for all activity. Pt remains at an increased risk for falls due to weakness and endurance deficits. Patient will benefit from continued inpatient follow up therapy, <3 hours/day.        If plan is discharge home, recommend the following: A little help with walking and/or transfers;A little help with bathing/dressing/bathroom;Assistance with cooking/housework;Assist for transportation;Help with stairs or ramp for entrance   Can travel by private vehicle   Yes    Equipment Recommendations None recommended by PT  Recommendations for Other Services       Functional Status Assessment Patient has had a recent decline in their functional status and demonstrates the ability to make significant improvements in function in a reasonable and predictable amount of time.     Precautions / Restrictions Precautions Precautions: Fall Recall of Precautions/Restrictions: Intact Restrictions Weight Bearing Restrictions Per Provider Order: No      Mobility  Bed Mobility Overal bed mobility: Modified Independent                  Transfers Overall transfer level: Needs assistance Equipment used: Rolling walker (2 wheels) Transfers: Sit to/from Stand Sit to Stand: Contact guard assist, From elevated surface                Ambulation/Gait Ambulation/Gait assistance: Contact guard assist Gait Distance (Feet): 100 Feet Assistive device: Rolling walker (2 wheels) Gait Pattern/deviations: Step-to  pattern Gait velocity: reduced Gait velocity interpretation: <1.8 ft/sec, indicate of risk for recurrent falls   General Gait Details: slowed step-to gait, increased trunk flexion over RW  Stairs            Wheelchair Mobility     Tilt Bed    Modified Rankin (Stroke Patients Only)       Balance Overall balance assessment: Needs assistance Sitting-balance support: No upper extremity supported, Feet supported Sitting balance-Leahy Scale: Good     Standing balance support: Bilateral upper extremity supported, Reliant on assistive device for balance Standing balance-Leahy Scale: Poor                               Pertinent Vitals/Pain Pain Assessment Pain Assessment: Faces Faces Pain Scale: Hurts little more Pain Location: L foot Pain Descriptors / Indicators: Sore Pain Intervention(s): Monitored during session    Home Living Family/patient expects to be discharged to:: Private residence Living Arrangements: Other relatives Available Help at Discharge: Family;Available 24 hours/day Type of Home: House Home Access: Stairs to enter Entrance Stairs-Rails: Right Entrance Stairs-Number of Steps: 1+1 Alternate Level Stairs-Number of Steps: 13 Home Layout: Multi-level;Able to live on main level with bedroom/bathroom;1/2 bath on main level Home Equipment: Cane - quad;BSC/3in1;Toilet riser Additional Comments: has not been able to get upstairs to shower    Prior Function Prior Level of Function : Needs assist             Mobility Comments: walking with quad cane at [facility] ADLs Comments: reports being able to sponge bathe herself at [facility], typically indepedent at  her sister's home in self care, med and appointment management, not responsible for meals or housekeeping, has not driven since September     Extremity/Trunk Assessment   Upper Extremity Assessment Upper Extremity Assessment: Defer to OT evaluation    Lower Extremity  Assessment Lower Extremity Assessment: Generalized weakness    Cervical / Trunk Assessment Cervical / Trunk Assessment: Other exceptions Cervical / Trunk Exceptions: body habitus  Communication   Communication Communication: No apparent difficulties    Cognition Arousal: Alert Behavior During Therapy: WFL for tasks assessed/performed   PT - Cognitive impairments: Memory                         Following commands: Impaired Following commands impaired: Follows one step commands with increased time     Cueing Cueing Techniques: Verbal cues     General Comments General comments (skin integrity, edema, etc.): VSS on RA    Exercises     Assessment/Plan    PT Assessment Patient needs continued PT services  PT Problem List Decreased strength;Decreased activity tolerance;Decreased balance;Decreased mobility;Decreased knowledge of use of DME       PT Treatment Interventions DME instruction;Stair training;Gait training;Functional mobility training;Therapeutic activities;Therapeutic exercise;Balance training;Neuromuscular re-education;Patient/family education    PT Goals (Current goals can be found in the Care Plan section)  Acute Rehab PT Goals Patient Stated Goal: to return home PT Goal Formulation: With patient Time For Goal Achievement: 06/16/24 Potential to Achieve Goals: Fair    Frequency Min 2X/week     Co-evaluation               AM-PAC PT 6 Clicks Mobility  Outcome Measure Help needed turning from your back to your side while in a flat bed without using bedrails?: None Help needed moving from lying on your back to sitting on the side of a flat bed without using bedrails?: None Help needed moving to and from a bed to a chair (including a wheelchair)?: A Little Help needed standing up from a chair using your arms (e.g., wheelchair or bedside chair)?: A Little Help needed to walk in hospital room?: A Little Help needed climbing 3-5 steps with a  railing? : Total 6 Click Score: 18    End of Session Equipment Utilized During Treatment: Gait belt Activity Tolerance: Patient tolerated treatment well Patient left: in bed;with call bell/phone within reach Nurse Communication: Mobility status PT Visit Diagnosis: Other abnormalities of gait and mobility (R26.89);Muscle weakness (generalized) (M62.81)    Time: 9090-9053 PT Time Calculation (min) (ACUTE ONLY): 37 min   Charges:   PT Evaluation $PT Eval Low Complexity: 1 Low   PT General Charges $$ ACUTE PT VISIT: 1 Visit         Bernardino JINNY Ruth, PT, DPT Acute Rehabilitation Office [phone]   Bernardino JINNY Ruth 06/02/2024, 3:27 PM

## 2024-06-02 NOTE — ED Notes (Signed)
 Social work contacted about the walker needed to go home with the patient.

## 2024-06-02 NOTE — Evaluation (Addendum)
 Occupational Therapy Evaluation Patient Details Name: [name] MRN: [id number] DOB: [birth year] Today's Date: 06/02/2024   History of Present Illness   68 y.o. female presents to [facility] hospital on 06/01/2024 from SNF with lethargy, hypotension and back pain.PMH: HFpEF, ESRD, HTN, CAD, DM     Clinical Impressions Pt typically walks with a quad cane and is independent in basic ADLs. She reports managing her own medications and medical appointments. Presents with impaired cognition, decreased balance with reliance on RW and CGA and decreased endurance. She needs set up to min assist for ADLs. Patient will benefit from continued inpatient follow up therapy, <3 hours/day. Will follow acutely.      If plan is discharge home, recommend the following:   A little help with walking and/or transfers;A little help with bathing/dressing/bathroom;Assistance with cooking/housework;Direct supervision/assist for medications management;Direct supervision/assist for financial management;Assist for transportation;Help with stairs or ramp for entrance     Functional Status Assessment   Patient has had a recent decline in their functional status and demonstrates the ability to make significant improvements in function in a reasonable and predictable amount of time.     Equipment Recommendations    (RW)     Recommendations for Other Services         Precautions/Restrictions   Precautions Precautions: Fall Restrictions Weight Bearing Restrictions Per Provider Order: No     Mobility Bed Mobility Overal bed mobility: Modified Independent             General bed mobility comments: HOB up, increased time, used rail to elevate trunk    Transfers Overall transfer level: Needs assistance Equipment used: Rolling walker (2 wheels) Transfers: Sit to/from Stand Sit to Stand: Contact guard assist, From elevated surface           General transfer comment: slow to rise       Balance Overall balance assessment: Needs assistance   Sitting balance-Leahy Scale: Good Sitting balance - Comments: no LOB leaning over to feet     Standing balance-Leahy Scale: Poor Standing balance comment: benefits from B UE support of walker                           ADL either performed or assessed with clinical judgement   ADL Overall ADL's : Needs assistance/impaired Eating/Feeding: Independent;Bed level   Grooming: Contact guard assist;Standing   Upper Body Bathing: Minimal assistance;Sitting   Lower Body Bathing: Contact guard assist;Sit to/from stand   Upper Body Dressing : Set up;Sitting   Lower Body Dressing: Contact guard assist;Sit to/from stand   Toilet Transfer: Contact guard assist;Ambulation;Rolling walker (2 wheels)           Functional mobility during ADLs: Contact guard assist;Rolling walker (2 wheels)       Vision Baseline Vision/History: 1 Wears glasses Ability to See in Adequate Light: 0 Adequate Patient Visual Report: No change from baseline       Perception         Praxis         Pertinent Vitals/Pain Pain Assessment Pain Assessment: Faces Faces Pain Scale: Hurts little more Pain Location: bottom of L foot Pain Descriptors / Indicators: Sore Pain Intervention(s): Monitored during session, Repositioned     Extremity/Trunk Assessment Upper Extremity Assessment Upper Extremity Assessment: Right hand dominant;Generalized weakness;RUE deficits/detail RUE Deficits / Details: limited shoulder AROM to 60 degrees, full AROM RUE Sensation: history of peripheral neuropathy RUE Coordination: decreased gross motor LUE Deficits /  Details: full AROM LUE Sensation: history of peripheral neuropathy   Lower Extremity Assessment Lower Extremity Assessment: Defer to PT evaluation   Cervical / Trunk Assessment Cervical / Trunk Assessment: Other exceptions (obesity)   Communication Communication Communication: No apparent  difficulties   Cognition Arousal: Alert Behavior During Therapy: WFL for tasks assessed/performed Cognition: Cognition impaired   Orientation impairments: Time, Situation Awareness: Intellectual awareness impaired, Online awareness impaired Memory impairment (select all impairments): Short-term memory Attention impairment (select first level of impairment): Selective attention Executive functioning impairment (select all impairments): Initiation, Problem solving OT - Cognition Comments: unable to multitask                 Following commands: Impaired Following commands impaired: Follows one step commands with increased time     Cueing  General Comments   Cueing Techniques: Verbal cues      Exercises     Shoulder Instructions      Home Living Family/patient expects to be discharged to:: Private residence Living Arrangements: Other relatives (sister) Available Help at Discharge: Family;Available 24 hours/day Type of Home: House Home Access: Stairs to enter [employer] of Steps: 1+1 Entrance Stairs-Rails: Right Home Layout: Multi-level;Able to live on main level with bedroom/bathroom;1/2 bath on main level     Bathroom Shower/Tub: Sponge bathes at baseline   Bathroom Toilet: Standard     Home Equipment: Cane - quad;BSC/3in1;Toilet riser   Additional Comments: has not been able to get upstairs to shower      Prior Functioning/Environment Prior Level of Function : Needs assist             Mobility Comments: walking with quad cane at [facility] ADLs Comments: reports being able to sponge bathe herself at [employer], typically indepedent at her sister's home in self care, med and appointment management, not responsible for meals or housekeeping, has not driven since September    OT Problem List: Decreased strength;Decreased range of motion;Decreased activity tolerance;Impaired balance (sitting and/or standing);Decreased safety  awareness;Pain;Impaired UE functional use;Obesity   OT Treatment/Interventions: Self-care/ADL training;Therapeutic exercise;Energy conservation;DME and/or AE instruction;Therapeutic activities;Patient/family education;Balance training;Cognitive remediation/compensation      OT Goals(Current goals can be found in the care plan section)   Acute Rehab OT Goals OT Goal Formulation: With patient Time For Goal Achievement: 06/16/24 Potential to Achieve Goals: Good ADL Goals Pt Will Perform Grooming: with supervision;standing Pt Will Perform Lower Body Bathing: with supervision;sit to/from stand Pt Will Perform Lower Body Dressing: with supervision;sit to/from stand Pt Will Transfer to Toilet: with modified independence;ambulating;bedside commode Pt Will Perform Toileting - Clothing Manipulation and hygiene: with modified independence;sit to/from stand Additional ADL Goal #1: Pt will participate in formal cognition screening.   OT Frequency:  Min 2X/week    Co-evaluation              AM-PAC OT 6 Clicks Daily Activity     Outcome Measure Help from another person eating meals?: None Help from another person taking care of personal grooming?: A Little Help from another person toileting, which includes using toliet, bedpan, or urinal?: A Little Help from another person bathing (including washing, rinsing, drying)?: A Little Help from another person to put on and taking off regular upper body clothing?: A Little Help from another person to put on and taking off regular lower body clothing?: A Little 6 Click Score: 19   End of Session Equipment Utilized During Treatment: Gait belt;Rolling walker (2 wheels)  Activity Tolerance: Patient tolerated treatment well Patient left: in  bed;with call bell/phone within reach  OT Visit Diagnosis: Unsteadiness on feet (R26.81);Other abnormalities of gait and mobility (R26.89);Muscle weakness (generalized) (M62.81);Pain;Other symptoms and signs  involving cognitive function                Time: 9090-9052 OT Time Calculation (min): 38 min Charges:  OT General Charges $OT Visit: 1 Visit OT Evaluation $OT Eval Moderate Complexity: 1 Mod  Mliss HERO, OTR/L Acute Rehabilitation Services Office: [phone]   Kennth Mliss Helling 06/02/2024, 10:00 AM

## 2024-06-02 NOTE — ED Notes (Signed)
 Patient walking in the hall with PT/OT

## 2024-06-21 ENCOUNTER — Emergency Department (HOSPITAL_COMMUNITY)

## 2024-06-21 ENCOUNTER — Other Ambulatory Visit: Payer: Self-pay

## 2024-06-21 ENCOUNTER — Emergency Department (HOSPITAL_COMMUNITY)
Admission: EM | Admit: 2024-06-21 | Discharge: 2024-06-21 | Disposition: A | Discharge status: Home / Self Care | Attending: Emergency Medicine | Admitting: Emergency Medicine

## 2024-06-21 ENCOUNTER — Encounter (HOSPITAL_COMMUNITY): Payer: Self-pay

## 2024-06-21 DIAGNOSIS — Z992 Dependence on renal dialysis: Secondary | ICD-10-CM | POA: Diagnosis not present

## 2024-06-21 DIAGNOSIS — N186 End stage renal disease: Secondary | ICD-10-CM | POA: Diagnosis not present

## 2024-06-21 DIAGNOSIS — R5383 Other fatigue: Secondary | ICD-10-CM | POA: Insufficient documentation

## 2024-06-21 DIAGNOSIS — Z7982 Long term (current) use of aspirin: Secondary | ICD-10-CM | POA: Insufficient documentation

## 2024-06-21 DIAGNOSIS — R739 Hyperglycemia, unspecified: Secondary | ICD-10-CM | POA: Diagnosis not present

## 2024-06-21 LAB — CBC WITH DIFFERENTIAL/PLATELET
Abs Immature Granulocytes: 0.01 K/uL (ref 0.00–0.07)
Basophils Absolute: 0 K/uL (ref 0.0–0.1)
Basophils Relative: 1 %
Eosinophils Absolute: 0.2 K/uL (ref 0.0–0.5)
Eosinophils Relative: 5 %
HCT: 37.6 % (ref 36.0–46.0)
Hemoglobin: 11 g/dL — ABNORMAL LOW (ref 12.0–15.0)
Immature Granulocytes: 0 %
Lymphocytes Relative: 27 %
Lymphs Abs: 1.3 K/uL (ref 0.7–4.0)
MCH: 28.1 pg (ref 26.0–34.0)
MCHC: 29.3 g/dL — ABNORMAL LOW (ref 30.0–36.0)
MCV: 96.2 fL (ref 80.0–100.0)
Monocytes Absolute: 0.4 K/uL (ref 0.1–1.0)
Monocytes Relative: 8 %
Neutro Abs: 2.9 K/uL (ref 1.7–7.7)
Neutrophils Relative %: 59 %
Platelets: 111 K/uL — ABNORMAL LOW (ref 150–400)
RBC: 3.91 MIL/uL (ref 3.87–5.11)
RDW: 17.4 % — ABNORMAL HIGH (ref 11.5–15.5)
WBC: 4.9 K/uL (ref 4.0–10.5)
nRBC: 0 % (ref 0.0–0.2)

## 2024-06-21 LAB — COMPREHENSIVE METABOLIC PANEL WITH GFR
ALT: 14 U/L (ref 0–44)
AST: 21 U/L (ref 15–41)
Albumin: 3.1 g/dL — ABNORMAL LOW (ref 3.5–5.0)
Alkaline Phosphatase: 206 U/L — ABNORMAL HIGH (ref 38–126)
Anion gap: 19 — ABNORMAL HIGH (ref 5–15)
BUN: 42 mg/dL — ABNORMAL HIGH (ref 8–23)
CO2: 23 mmol/L (ref 22–32)
Calcium: 8.8 mg/dL — ABNORMAL LOW (ref 8.9–10.3)
Chloride: 99 mmol/L (ref 98–111)
Creatinine, Ser: 7.69 mg/dL — ABNORMAL HIGH (ref 0.44–1.00)
GFR, Estimated: 5 mL/min — ABNORMAL LOW (ref >60)
Glucose, Bld: 213 mg/dL — ABNORMAL HIGH (ref 70–99)
Potassium: 4.7 mmol/L (ref 3.5–5.1)
Sodium: 141 mmol/L (ref 135–145)
Total Bilirubin: 0.2 mg/dL (ref 0.0–1.2)
Total Protein: 6 g/dL — ABNORMAL LOW (ref 6.5–8.1)

## 2024-06-21 LAB — CBG MONITORING, ED: Glucose-Capillary: 220 mg/dL — ABNORMAL HIGH (ref 70–99)

## 2024-06-21 NOTE — Discharge Instructions (Signed)
 Please go to dialysis tomorrow.  You experience any worsening symptoms please return to the emergency department.

## 2024-06-21 NOTE — ED Provider Notes (Signed)
 [name] EMERGENCY DEPARTMENT AT [facility] Provider Note   CSN: [id number] Arrival date & time: 06/21/24  1333     Patient presents with: Fatigue and Hyperglycemia   [name] is a 68 y.o. female.   68 y.o female with a PMH of ESRD on dialysis [facility] presents to the ED via EMS with a chief complaint of lethargic and hyperglycemia.  Patient reports she went to the dialysis center to obtain her dialysis, she says she fell asleep, thought that they were likely dialyzing her at that time.  EMS states that the dialysis center reported some soft blood pressures however his manual cuff read maps above 65.  He does take Percocet for chronic knee pain.  She reports her knees do hurt whenever she walks around.  In addition, she was found to be hyperglycemic with a blood sugar in the 400s at the dialysis center.  She usually gets dialysis on T Tuesday, Thursday, Saturday but states that she sometimes skips her dialysis.  She does feel somewhat fluid overloaded at this time.  Denies any other complaints. Of note, patient reports that she usually takes Ozempic injections for her blood sugar control.  She has not had this injection in approximately 3 weeks.  The history is provided by the patient.  Hyperglycemia Blood sugar level PTA:  400 Associated symptoms: no abdominal pain, no chest pain, no fever, no nausea, no shortness of breath and no vomiting        Prior to Admission medications   Medication Sig Start Date End Date Taking? Authorizing Provider  albuterol  (VENTOLIN  HFA) 108 (90 Base) MCG/ACT inhaler Inhale 2 puffs into the lungs every 4 (four) hours as needed for wheezing or shortness of breath. 06/28/20   Vivienne Delon CHRISTELLA, PA-C  aspirin  81 MG EC tablet Take 1 tablet (81 mg total) by mouth daily. 09/20/20   Singh, Prashant K, MD  atorvastatin  (LIPITOR ) 80 MG tablet Take 1 tablet (80 mg total) by mouth every evening. Patient taking differently: Take 80 mg by mouth at  bedtime. 04/24/21   Myrla Jon CHRISTELLA, MD  cinacalcet  (SENSIPAR ) 30 MG tablet Take 30 mg by mouth daily. Patient not taking: Reported on 06/01/2024 04/11/24   [provider]  diclofenac  Sodium (VOLTAREN ) 1 % GEL Apply 2 g topically in the morning, at noon, and at bedtime. Left and right knee    [provider]  fluticasone  (FLONASE ) 50 MCG/ACT nasal spray Place 2 sprays into both nostrils daily as needed for allergies or rhinitis. 09/28/17   Vivienne Delon CHRISTELLA, PA-C  fluticasone  furoate-vilanterol (BREO ELLIPTA ) 200-25 MCG/INH AEPB Inhale 1 puff into the lungs daily. 08/30/20   Vivienne Delon CHRISTELLA, PA-C  hydrOXYzine  (ATARAX ) 25 MG tablet Take 1 tablet (25 mg total) by mouth 2 (two) times daily. 11/17/22   Hongalgi, Anand D, MD  lidocaine  (LIDODERM ) 5 % Place 1 patch onto the skin daily. Remove & Discard patch within 12 hours or as directed by MD Patient not taking: Reported on 06/01/2024 06/25/23   Palumbo, April, MD  midodrine  (PROAMATINE ) 10 MG tablet Take 1 tablet (10 mg total) by mouth 3 (three) times daily with meals. 06/02/24   Harrie Bruckner, DO  naloxone  (NARCAN ) nasal spray 4 mg/0.1 mL Place 1 spray into the nose daily as needed (for overdose). 04/08/22   [provider]  nitroGLYCERIN  (NITROSTAT ) 0.4 MG SL tablet Place 1 tablet (0.4 mg total) under the tongue every 5 (five) minutes x 3 doses as  needed for chest pain. Patient not taking: Reported on 06/01/2024 03/08/24   Howell Lunger, DO  Oxycodone  HCl 10 MG TABS Take 1 tablet (10 mg total) by mouth 3 (three) times daily. 05/12/24   Smucker, Melvenia, MD  OZEMPIC, 2 MG/DOSE, 8 MG/3ML SOPN Inject 2 mg into the skin once a week.    [provider]  pantoprazole  (PROTONIX ) 40 MG tablet Take 40 mg by mouth daily before breakfast.    [provider]  polyethylene glycol powder (GLYCOLAX /MIRALAX ) 17 GM/SCOOP powder Dissolve 1 capful (17g) in 4-8 ounces of liquid and take by mouth daily as  needed for mild constipation 03/30/24   Gomes, Adriana, DO  pregabalin  (LYRICA ) 100 MG capsule Take 1 capsule (100 mg total) by mouth 2 (two) times daily. 05/12/24   Smucker, Melvenia, MD  senna (SENOKOT) 8.6 MG TABS tablet Take 1 tablet (8.6 mg total) by mouth 2 (two) times daily. 03/30/24   Gomes, Adriana, DO  sertraline  (ZOLOFT ) 25 MG tablet Take 25 mg by mouth daily. 05/22/24   [provider]  torsemide  (DEMADEX ) 100 MG tablet Take 1 tablet (100mg ) on NON-dialysis days (Sunday, Monday, Wednesday, Friday) 03/08/24   Everhart, Kirstie, DO  valACYclovir  (VALTREX ) 500 MG tablet TAKE (1) TABLET BY MOUTH EVERY OTHER DAY. Patient taking differently: Take 500 mg by mouth every other day. 01/24/21   Bacigalupo, Angela M, MD  VELPHORO  500 MG chewable tablet Take 1 tablet with meals and snacks 05/12/24   Smucker, Melvenia, MD    Allergies: Shellfish allergy, Diazepam , and Morphine     Review of Systems  Constitutional:  Negative for chills and fever.  Respiratory:  Negative for shortness of breath.   Cardiovascular:  Negative for chest pain.  Gastrointestinal:  Negative for abdominal pain, nausea and vomiting.  Genitourinary:  Negative for flank pain.  Musculoskeletal:  Negative for back pain.  Skin:  Negative for pallor and wound.  Neurological:  Negative for light-headedness and headaches.  All other systems reviewed and are negative.   Updated Vital Signs BP (!) 111/48   Pulse 75   Temp 97.6 F (36.4 C) (Oral)   Resp 18   Ht 5' 5 (1.651 m)   Wt 104 kg   SpO2 100%   BMI 38.15 kg/m   Physical Exam  (all labs ordered are listed, but only abnormal results are displayed) Labs Reviewed  CBC WITH DIFFERENTIAL/PLATELET - Abnormal; Notable for the following components:      Result Value   Hemoglobin 11.0 (*)    MCHC 29.3 (*)    RDW 17.4 (*)    Platelets 111 (*)    All other components within normal limits  COMPREHENSIVE METABOLIC PANEL WITH GFR - Abnormal; Notable for the  following components:   Glucose, Bld 213 (*)    BUN 42 (*)    Creatinine, Ser 7.69 (*)    Calcium  8.8 (*)    Total Protein 6.0 (*)    Albumin  3.1 (*)    Alkaline Phosphatase 206 (*)    GFR, Estimated 5 (*)    Anion gap 19 (*)    All other components within normal limits  CBG MONITORING, ED - Abnormal; Notable for the following components:   Glucose-Capillary 220 (*)    All other components within normal limits  CBG MONITORING, ED    EKG: EKG Interpretation Date/Time:  Wednesday June 21 2024 13:43:46 EST Ventricular Rate:  72 PR Interval:  141 QRS Duration:  99 QT Interval:  440 QTC Calculation:  482 R Axis:   50  Text Interpretation: Sinus rhythm Low voltage, precordial leads Confirmed by Garrick Charleston [phone]) on 06/21/2024 2:54:17 PM  Radiology: DG Chest Portable 1 View Result Date: 06/21/2024 EXAM: 1 VIEW(S) XRAY OF THE CHEST 06/21/2024 02:06:00 PM COMPARISON: Comparison 06/01/2024. CLINICAL HISTORY: fluid overload FINDINGS: LUNGS AND PLEURA: Minimal right basilar subsegmental atelectasis or scarring is noted. No pleural effusion. No pneumothorax. HEART AND MEDIASTINUM: No acute abnormality of the cardiac and mediastinal silhouettes. BONES AND SOFT TISSUES: No acute osseous abnormality. IMPRESSION: 1. Minimal right basilar subsegmental atelectasis or scarring. Electronically signed by: Lynwood Seip MD 06/21/2024 02:46 PM EST RP Workstation: [id number]     Procedures   Medications Ordered in the ED - No data to display  Clinical Course as of 06/21/24 1501  Wed Jun 21, 2024  1405 Glucose-Capillary(!): 220 [JS]    Clinical Course User Index [JS] Malek Skog, PA-C                                 Medical Decision Making Amount and/or Complexity of Data Reviewed Labs: ordered. Decision-making details documented in ED Course. Radiology: ordered.    This patient presents to the ED for concern of fatigue, hyperglycemia, this involves a number of treatment  options, and is a complaint that carries with it a high risk of complications and morbidity.  The differential diagnosis includes infection, versus volume overload.    Co morbidities: Discussed in HPI   Brief History:  See HPI.   EMR reviewed including pt PMHx, past surgical history and past visits to ER.   See HPI for more details   Lab Tests:  I ordered and independently interpreted labs.  The pertinent results include:    CMP with no electrolyte abnormality.  Creatinine is slightly elevated but within her baseline.  Her potassium is normal.  LFTs are within normal limits.  Anion gap is slightly elevated.  She does not have any complaints at this time.  CBG repeat is 220.  Imaging Studies:  Xray of chest showed: IMPRESSION:  1. Minimal right basilar subsegmental atelectasis or scarring.   Cardiac Monitoring:  The patient was maintained on a cardiac monitor.  I personally viewed and interpreted the cardiac monitored which showed an underlying rhythm of:nsr 74 EKG non-ischemic  Medicines ordered:  N/A  Consults:  I requested consultation with Dr. Geralynn,  and discussed lab and imaging findings as well as pertinent plan - they recommend: if laboratory results are normal she can likely be discharged home and go to dialysis tomorrow.  Otherwise she will need to be admitted and likely dialyzed overnight.  Reevaluation:  After the interventions noted above I re-evaluated patient and found that they have :improved  Social Determinants of Health:  The patient's social determinants of health were a factor in the care of this patient   Problem List / ED Course:  Patient presented here from dialysis center, after not getting dialyzed.  The facility did call EMS as patient appeared to be lethargic and hyperglycemic with a blood sugar in the 400s.  When EMS arrived patient was normotensive, alert and oriented x 4.  She is answering all questions appropriately here.  She is  normotensive as well.  She does skip her dialysis sessions and has not had 1 since Monday although does not usually her normal day.  She does sound somewhat fluid overloaded on my exam.  Will obtain basic  blood work, imaging and likely have patient dialyzed while in the hospital.  She is remaining hemodynamically stable.  CBG was rechecked with a blood sugar in the 200s, patient is actually on Ozempic injections once a week but has not had this in approximately 3 to 4 weeks. Patient's blood work here is at her baseline.  She does not have any hyper kalemia, chest x-Lawn does not show any signs of fluid overload.  I did discuss this case with nephrology who is unable to dialyze her at this time.  I do not feel that she needs emergent dialysis.  She is hemodynamically stable with normal blood pressures.  She is requesting some food here, patient also states I am going to go tomorrow as tomorrow is my actual day.  She is hemodynamically stable for discharge.   Dispostion:  After consideration of the diagnostic results and the patients response to treatment, I feel that the patent would benefit from obtaining dialysis tomorrow.    Portions of this note were generated with Scientist, clinical (histocompatibility and immunogenetics). Dictation errors may occur despite best attempts at proofreading.   Final diagnoses:  Other fatigue  Hyperglycemia    ED Discharge Orders     None          Maureen Broad, PA-C 06/21/24 1501    Garrick Charleston, MD 06/24/24 2224

## 2024-06-21 NOTE — ED Triage Notes (Signed)
 Pt BIB [facility] from dialysis. Facility called due to hypotension 70 SBP and lethargic. EMS VS were 118/46 and HR 70. CBG 403. Takes percocet for pain in knees but havent had one today. Pt alert and oriented with EMS.

## 2024-06-21 NOTE — ED Notes (Signed)
 Pt is not from facility. Pt lives at home with sister. Reviewed written d/c instructions with pt and talked to sister on the phone to confirm she was coming to transport pt home. Pt d/c to lobby with all belongings and in stable condition.

## 2024-07-10 ENCOUNTER — Emergency Department (HOSPITAL_COMMUNITY)
Admission: EM | Admit: 2024-07-10 | Discharge: 2024-07-11 | Disposition: A | Discharge status: Home / Self Care | Attending: Emergency Medicine | Admitting: Emergency Medicine

## 2024-07-10 DIAGNOSIS — Z7982 Long term (current) use of aspirin: Secondary | ICD-10-CM | POA: Insufficient documentation

## 2024-07-10 DIAGNOSIS — Z992 Dependence on renal dialysis: Secondary | ICD-10-CM | POA: Insufficient documentation

## 2024-07-10 DIAGNOSIS — I132 Hypertensive heart and chronic kidney disease with heart failure and with stage 5 chronic kidney disease, or end stage renal disease: Secondary | ICD-10-CM | POA: Diagnosis not present

## 2024-07-10 DIAGNOSIS — E875 Hyperkalemia: Secondary | ICD-10-CM | POA: Insufficient documentation

## 2024-07-10 DIAGNOSIS — G8929 Other chronic pain: Secondary | ICD-10-CM | POA: Insufficient documentation

## 2024-07-10 DIAGNOSIS — M25561 Pain in right knee: Secondary | ICD-10-CM | POA: Insufficient documentation

## 2024-07-10 DIAGNOSIS — M25562 Pain in left knee: Secondary | ICD-10-CM | POA: Insufficient documentation

## 2024-07-10 DIAGNOSIS — I509 Heart failure, unspecified: Secondary | ICD-10-CM | POA: Insufficient documentation

## 2024-07-10 DIAGNOSIS — N186 End stage renal disease: Secondary | ICD-10-CM | POA: Diagnosis not present

## 2024-07-10 DIAGNOSIS — M545 Low back pain, unspecified: Secondary | ICD-10-CM | POA: Diagnosis present

## 2024-07-10 NOTE — ED Provider Triage Note (Signed)
 Emergency Medicine Provider Triage Evaluation Note  [name] , a 68 y.o. female  was evaluated in triage.  Pt complains of full body pain.  Review of Systems  Positive: Body pain and knee pain Negative: fever  Physical Exam  BP (!) 147/58 (BP Location: Left Arm)   Pulse 74   Temp 98 F (36.7 C) (Oral)   Resp 16   SpO2 100%  Gen:   Awake, no distress   Resp:  Normal effort  MSK:   Moves extremities without difficulty  Other:    Medical Decision Making  Medically screening exam initiated at 11:45 PM.  Appropriate orders placed.  [name] was informed that the remainder of the evaluation will be completed by another provider, this initial triage assessment does not replace that evaluation, and the importance of remaining in the ED until their evaluation is complete.   EKG Interpretation Date/Time:  Monday July 10 2024 22:44:30 EST Ventricular Rate:  75 PR Interval:  146 QRS Duration:  88 QT Interval:  436 QTC Calculation: 486 R Axis:   24  Text Interpretation: Normal sinus rhythm Cannot rule out Anterior infarct , age undetermined Abnormal ECG Confirmed by Midge Golas ([id number]) on 07/10/2024 11:44:27 PM          Midge Golas, MD 07/10/24 [phone]

## 2024-07-10 NOTE — ED Triage Notes (Signed)
 68 yo pt BIB  [facility] from home with sharp abd pain since Saturday with  diarrhea. Noting small amounts of bright red blood in stool when wiping. Dialysis on Friday and missed dialysis today. Fistula L side. Bilateral nontraumatic knee pain d/t pending knee replacement, ambulates at home with a cane. A/O x4 , denies vomiting/fever. Lives at home with son.   EMS vital:  132/64  77 bpm  97% RA CBG 169, diabetic

## 2024-07-11 ENCOUNTER — Telehealth (HOSPITAL_COMMUNITY): Payer: Self-pay

## 2024-07-11 DIAGNOSIS — M545 Low back pain, unspecified: Secondary | ICD-10-CM | POA: Diagnosis not present

## 2024-07-11 LAB — COMPREHENSIVE METABOLIC PANEL WITH GFR
ALT: 24 U/L (ref 0–44)
AST: 27 U/L (ref 15–41)
Albumin: 4.2 g/dL (ref 3.5–5.0)
Alkaline Phosphatase: 295 U/L — ABNORMAL HIGH (ref 38–126)
Anion gap: 20 — ABNORMAL HIGH (ref 5–15)
BUN: 81 mg/dL — ABNORMAL HIGH (ref 8–23)
CO2: 23 mmol/L (ref 22–32)
Calcium: 8.9 mg/dL (ref 8.9–10.3)
Chloride: 98 mmol/L (ref 98–111)
Creatinine, Ser: 11.3 mg/dL — ABNORMAL HIGH (ref 0.44–1.00)
GFR, Estimated: 3 mL/min — ABNORMAL LOW
Glucose, Bld: 148 mg/dL — ABNORMAL HIGH (ref 70–99)
Potassium: 5.9 mmol/L — ABNORMAL HIGH (ref 3.5–5.1)
Sodium: 141 mmol/L (ref 135–145)
Total Bilirubin: 0.2 mg/dL (ref 0.0–1.2)
Total Protein: 7 g/dL (ref 6.5–8.1)

## 2024-07-11 LAB — CBG MONITORING, ED: Glucose-Capillary: 187 mg/dL — ABNORMAL HIGH (ref 70–99)

## 2024-07-11 LAB — CBC
HCT: 35.4 % — ABNORMAL LOW (ref 36.0–46.0)
Hemoglobin: 10.7 g/dL — ABNORMAL LOW (ref 12.0–15.0)
MCH: 27.5 pg (ref 26.0–34.0)
MCHC: 30.2 g/dL (ref 30.0–36.0)
MCV: 91 fL (ref 80.0–100.0)
Platelets: 136 K/uL — ABNORMAL LOW (ref 150–400)
RBC: 3.89 MIL/uL (ref 3.87–5.11)
RDW: 18.2 % — ABNORMAL HIGH (ref 11.5–15.5)
WBC: 4.9 K/uL (ref 4.0–10.5)
nRBC: 0 % (ref 0.0–0.2)

## 2024-07-11 LAB — LIPASE, BLOOD: Lipase: 45 U/L (ref 11–51)

## 2024-07-11 MED ORDER — HYDROCODONE-ACETAMINOPHEN 5-325 MG PO TABS: 1.0000 | ORAL_TABLET | Freq: Four times a day (QID) | ORAL | 0 refills | Status: DC | PRN

## 2024-07-11 MED ORDER — OXYCODONE HCL 5 MG PO TABS
5.0000 mg | ORAL_TABLET | Freq: Once | ORAL | Status: AC
  Administered 2024-07-11: 5 mg via ORAL
  Filled 2024-07-11: qty 1

## 2024-07-11 MED ORDER — DEXTROSE 50 % IV SOLN: 1.0000 | Freq: Once | INTRAVENOUS | Status: DC

## 2024-07-11 MED ORDER — HYDROCODONE-ACETAMINOPHEN 5-325 MG PO TABS
1.0000 | ORAL_TABLET | Freq: Once | ORAL | Status: AC
  Administered 2024-07-11: 1 via ORAL
  Filled 2024-07-11: qty 1

## 2024-07-11 MED ORDER — SODIUM ZIRCONIUM CYCLOSILICATE 10 G PO PACK
10.0000 g | PACK | Freq: Once | ORAL | Status: AC
  Administered 2024-07-11: 10 g via ORAL
  Filled 2024-07-11: qty 1

## 2024-07-11 MED ORDER — INSULIN ASPART 100 UNIT/ML IV SOLN: 5.0000 [IU] | Freq: Once | INTRAVENOUS | Status: DC

## 2024-07-11 NOTE — ED Provider Notes (Signed)
 " [place] EMERGENCY DEPARTMENT AT [facility] Provider Note   CSN: [id number] Arrival date & time: 07/10/24  2154     Patient presents with: Abdominal Pain   [name] is a 68 y.o. female.  Patient with past medical history of end-stage renal disease on dialysis, congestive heart failure lipidemia, hypertension, obstructive sleep apnea, presents emergency room today with complaint of abdominal pain 3 days ago associated with multiple episodes of loose stool. She reports 4 episodes of BRB in stool, no BT. She reports that this is now resolved and she is feeling better.  She has only had 1 bowel movement in the last 24 hours.  To me she only complains of right sided low back pain which is chronic.  She denies any red flag symptoms with this or new symptoms. Reports chronic bilateral knee pain, s/p knee replacement and is requesting home dose of Oxycodone  for this.     Abdominal Pain      Prior to Admission medications  Medication Sig Start Date End Date Taking? Authorizing Provider  HYDROcodone -acetaminophen  (NORCO/VICODIN) 5-325 MG tablet Take 1 tablet by mouth every 6 (six) hours as needed for severe pain (pain score 7-10). 07/11/24  Yes Yunique Dearcos, Warren SAILOR, PA-C  albuterol  (VENTOLIN  HFA) 108 (90 Base) MCG/ACT inhaler Inhale 2 puffs into the lungs every 4 (four) hours as needed for wheezing or shortness of breath. 06/28/20   Vivienne Delon CHRISTELLA, PA-C  aspirin  81 MG EC tablet Take 1 tablet (81 mg total) by mouth daily. 09/20/20   Singh, Prashant K, MD  atorvastatin  (LIPITOR ) 80 MG tablet Take 1 tablet (80 mg total) by mouth every evening. Patient taking differently: Take 80 mg by mouth at bedtime. 04/24/21   Myrla Jon CHRISTELLA, MD  cinacalcet  (SENSIPAR ) 30 MG tablet Take 30 mg by mouth daily. Patient not taking: Reported on 06/01/2024 04/11/24   [provider]  diclofenac  Sodium (VOLTAREN ) 1 % GEL Apply 2 g topically in the morning, at noon, and at bedtime. Left  and right knee    [provider]  fluticasone  (FLONASE ) 50 MCG/ACT nasal spray Place 2 sprays into both nostrils daily as needed for allergies or rhinitis. 09/28/17   Vivienne Delon CHRISTELLA, PA-C  fluticasone  furoate-vilanterol (BREO ELLIPTA ) 200-25 MCG/INH AEPB Inhale 1 puff into the lungs daily. 08/30/20   Vivienne Delon CHRISTELLA, PA-C  hydrOXYzine  (ATARAX ) 25 MG tablet Take 1 tablet (25 mg total) by mouth 2 (two) times daily. 11/17/22   Hongalgi, Anand D, MD  lidocaine  (LIDODERM ) 5 % Place 1 patch onto the skin daily. Remove & Discard patch within 12 hours or as directed by MD Patient not taking: Reported on 06/01/2024 06/25/23   Palumbo, April, MD  midodrine  (PROAMATINE ) 10 MG tablet Take 1 tablet (10 mg total) by mouth 3 (three) times daily with meals. 06/02/24   Harrie Bruckner, DO  naloxone  (NARCAN ) nasal spray 4 mg/0.1 mL Place 1 spray into the nose daily as needed (for overdose). 04/08/22   [provider]  nitroGLYCERIN  (NITROSTAT ) 0.4 MG SL tablet Place 1 tablet (0.4 mg total) under the tongue every 5 (five) minutes x 3 doses as needed for chest pain. Patient not taking: Reported on 06/01/2024 03/08/24   Howell Lunger, DO  Oxycodone  HCl 10 MG TABS Take 1 tablet (10 mg total) by mouth 3 (three) times daily. 05/12/24   Smucker, Melvenia, MD  OZEMPIC, 2 MG/DOSE, 8 MG/3ML SOPN Inject 2 mg into the skin once a week.  [provider]  pantoprazole  (PROTONIX ) 40 MG tablet Take 40 mg by mouth daily before breakfast.    [provider]  polyethylene glycol powder (GLYCOLAX /MIRALAX ) 17 GM/SCOOP powder Dissolve 1 capful (17g) in 4-8 ounces of liquid and take by mouth daily as needed for mild constipation 03/30/24   Gomes, Adriana, DO  pregabalin  (LYRICA ) 100 MG capsule Take 1 capsule (100 mg total) by mouth 2 (two) times daily. 05/12/24   Smucker, Melvenia, MD  senna (SENOKOT) 8.6 MG TABS tablet Take 1 tablet (8.6 mg total) by mouth 2 (two) times daily. 03/30/24    Gomes, Adriana, DO  sertraline  (ZOLOFT ) 25 MG tablet Take 25 mg by mouth daily. 05/22/24   [provider]  torsemide  (DEMADEX ) 100 MG tablet Take 1 tablet (100mg ) on NON-dialysis days (Sunday, Monday, Wednesday, Friday) 03/08/24   Everhart, Kirstie, DO  valACYclovir  (VALTREX ) 500 MG tablet TAKE (1) TABLET BY MOUTH EVERY OTHER DAY. Patient taking differently: Take 500 mg by mouth every other day. 01/24/21   Myrla Jon HERO, MD  VELPHORO  500 MG chewable tablet Take 1 tablet with meals and snacks 05/12/24   Napoleon Melvenia, MD    Allergies: Shellfish allergy, Diazepam , and Morphine     Review of Systems  Gastrointestinal:  Positive for abdominal pain.    Updated Vital Signs BP (!) 157/135   Pulse 73   Temp 97.9 F (36.6 C)   Resp 17   SpO2 100%   Physical Exam Vitals and nursing note reviewed.  Constitutional:      General: She is not in acute distress.    Appearance: She is not toxic-appearing.  HENT:     Head: Normocephalic and atraumatic.  Eyes:     General: No scleral icterus.    Conjunctiva/sclera: Conjunctivae normal.  Cardiovascular:     Rate and Rhythm: Normal rate and regular rhythm.     Pulses: Normal pulses.     Heart sounds: Normal heart sounds.  Pulmonary:     Effort: Pulmonary effort is normal. No respiratory distress.     Breath sounds: Normal breath sounds.  Abdominal:     General: Abdomen is flat. Bowel sounds are normal.     Palpations: Abdomen is soft.     Tenderness: There is no abdominal tenderness.  Musculoskeletal:     Comments: Bilateral knee pain neurovasc intact distally.  Reproducible right low back pain, no midline TTP. No saddle anesthesia or loss of bowel or bladder  Skin:    General: Skin is warm and dry.     Findings: No lesion.  Neurological:     General: No focal deficit present.     Mental Status: She is alert and oriented to person, place, and time. Mental status is at baseline.     (all labs ordered are listed,  but only abnormal results are displayed) Labs Reviewed  COMPREHENSIVE METABOLIC PANEL WITH GFR - Abnormal; Notable for the following components:      Result Value   Potassium 5.9 (*)    Glucose, Bld 148 (*)    BUN 81 (*)    Creatinine, Ser 11.30 (*)    Alkaline Phosphatase 295 (*)    GFR, Estimated 3 (*)    Anion gap 20 (*)    All other components within normal limits  CBC - Abnormal; Notable for the following components:   Hemoglobin 10.7 (*)    HCT 35.4 (*)    RDW 18.2 (*)    Platelets 136 (*)    All  other components within normal limits  CBG MONITORING, ED - Abnormal; Notable for the following components:   Glucose-Capillary 187 (*)    All other components within normal limits  LIPASE, BLOOD    EKG: EKG Interpretation Date/Time:  Monday July 10 2024 22:44:30 EST Ventricular Rate:  75 PR Interval:  146 QRS Duration:  88 QT Interval:  436 QTC Calculation: 486 R Axis:   24  Text Interpretation: Normal sinus rhythm Cannot rule out Anterior infarct , age undetermined Abnormal ECG Confirmed by Midge Golas ([id number]) on 07/10/2024 11:44:27 PM  Radiology: No results found.   Procedures   Medications Ordered in the ED  sodium zirconium cyclosilicate  (LOKELMA ) packet 10 g (has no administration in time range)  oxyCODONE  (Oxy IR/ROXICODONE ) immediate release tablet 5 mg (has no administration in time range)  HYDROcodone -acetaminophen  (NORCO/VICODIN) 5-325 MG per tablet 1 tablet (1 tablet Oral Given 07/11/24 0109)                                    Medical Decision Making Amount and/or Complexity of Data Reviewed Labs: ordered.  Risk Prescription drug management.   This patient presents to the ED for concern of abdominal pain, this involves an extensive number of treatment options, and is a complaint that carries with it a high risk of complications and morbidity.  The differential diagnosis includes diverticulitis, gastritis, appendicitis, pyelonephritis,  urinary tract infection   Co morbidities that complicate the patient evaluation  end-stage renal disease on dialysis, congestive heart failure lipidemia, hypertension, obstructive sleep apnea   Additional history obtained:  Additional history obtained from hospital stay 06/02/2024.  Patient was admitted for altered mental status.   Lab Tests:  I personally interpreted labs.  The pertinent results include:   CBC shows stable hemoglobin of 10.7, potassium is remarkable for elevated potassium at 5.7, will receive hyperkalemia treatment with Lokelma .   Cardiac Monitoring: / EKG:  The patient was maintained on a cardiac monitor.  I personally viewed and interpreted the cardiac monitored which showed an underlying rhythm of: no ST change   Problem List / ED Course / Critical interventions / Medication management  Patient presents with abdominal pain and diarrhea that started 3 days ago.  She also reports that with her loose stool she had noticed some blood in her stool.  She reports that this is now resolved and she has not had any loose stool for over 24 hours.  On arrival hemodynamically stable and well-appearing.  She is not on blood thinner.  She denies further blood in stool and she refuses rectal exam/Hemoccult.  Abdomen is soft, nondistended nontender and she reports that her abdominal pain has resolved.  She tells me she is having chronic right back pain as well as chronic knee pain and she is requesting her home dose of medication.  She is able to ambulate.  She was given oxycodone  with improvement in symptoms.  Given that she has not had acute change in symptoms will not repeat imaging at this time.  Potassium was also found to be 5.8 she has no EKG changes.  Will give Lokelma .  She follows up with dialysis clinic tomorrow and assures that she can go. I have reviewed the patients home medicines and have made adjustments as needed Vitals are stable patient.  Patient appears stable for  discharge at this time.  Given return precautions.  Final diagnoses:  Chronic right-sided low back pain without sciatica  Chronic pain of both knees  Hyperkalemia    ED Discharge Orders          Ordered    HYDROcodone -acetaminophen  (NORCO/VICODIN) 5-325 MG tablet  Every 6 hours PRN        07/11/24 1338               Jerett Odonohue, Warren SAILOR, PA-C 07/11/24 1348  "

## 2024-07-11 NOTE — Discharge Instructions (Addendum)
 Please make sure that you go to dialysis tomorrow.   I have sent medication to your pharmacy. Only take for severe pain.  Recheck hemoglobin with [facility] when you follow up.   Return to ER with new or worsening symptoms.

## 2024-07-11 NOTE — ED Notes (Signed)
 Pt asked if she can provided a urine sample. Pt states she very seldom urinates due to being on dialysis.

## 2024-07-11 NOTE — Telephone Encounter (Cosign Needed)
 Patient called requesting medication to new pharmacy. Norco Will be resent.

## 2024-07-29 ENCOUNTER — Encounter (HOSPITAL_COMMUNITY): Payer: Self-pay | Admitting: Internal Medicine

## 2024-07-29 ENCOUNTER — Observation Stay (HOSPITAL_COMMUNITY)

## 2024-07-29 ENCOUNTER — Other Ambulatory Visit: Payer: Self-pay

## 2024-07-29 ENCOUNTER — Emergency Department (HOSPITAL_COMMUNITY)

## 2024-07-29 ENCOUNTER — Observation Stay (HOSPITAL_COMMUNITY)
Admission: EM | Admit: 2024-07-29 | Discharge: 2024-08-04 | Disposition: A | Discharge status: Skilled Nursing Facility | Attending: Emergency Medicine | Admitting: Emergency Medicine

## 2024-07-29 DIAGNOSIS — I5043 Acute on chronic combined systolic (congestive) and diastolic (congestive) heart failure: Secondary | ICD-10-CM | POA: Insufficient documentation

## 2024-07-29 DIAGNOSIS — M25561 Pain in right knee: Secondary | ICD-10-CM | POA: Insufficient documentation

## 2024-07-29 DIAGNOSIS — Z955 Presence of coronary angioplasty implant and graft: Secondary | ICD-10-CM | POA: Insufficient documentation

## 2024-07-29 DIAGNOSIS — B182 Chronic viral hepatitis C: Secondary | ICD-10-CM | POA: Diagnosis not present

## 2024-07-29 DIAGNOSIS — N186 End stage renal disease: Secondary | ICD-10-CM | POA: Insufficient documentation

## 2024-07-29 DIAGNOSIS — I350 Nonrheumatic aortic (valve) stenosis: Secondary | ICD-10-CM | POA: Diagnosis not present

## 2024-07-29 DIAGNOSIS — Z79899 Other long term (current) drug therapy: Secondary | ICD-10-CM | POA: Insufficient documentation

## 2024-07-29 DIAGNOSIS — Z794 Long term (current) use of insulin: Secondary | ICD-10-CM | POA: Insufficient documentation

## 2024-07-29 DIAGNOSIS — I5A Non-ischemic myocardial injury (non-traumatic): Secondary | ICD-10-CM | POA: Diagnosis not present

## 2024-07-29 DIAGNOSIS — Z6841 Body Mass Index (BMI) 40.0 and over, adult: Secondary | ICD-10-CM

## 2024-07-29 DIAGNOSIS — R188 Other ascites: Secondary | ICD-10-CM | POA: Diagnosis not present

## 2024-07-29 DIAGNOSIS — G4733 Obstructive sleep apnea (adult) (pediatric): Secondary | ICD-10-CM | POA: Insufficient documentation

## 2024-07-29 DIAGNOSIS — I2510 Atherosclerotic heart disease of native coronary artery without angina pectoris: Secondary | ICD-10-CM | POA: Diagnosis not present

## 2024-07-29 DIAGNOSIS — E1140 Type 2 diabetes mellitus with diabetic neuropathy, unspecified: Secondary | ICD-10-CM | POA: Insufficient documentation

## 2024-07-29 DIAGNOSIS — R7989 Other specified abnormal findings of blood chemistry: Secondary | ICD-10-CM

## 2024-07-29 DIAGNOSIS — M1039 Gout due to renal impairment, multiple sites: Secondary | ICD-10-CM | POA: Insufficient documentation

## 2024-07-29 DIAGNOSIS — Z87891 Personal history of nicotine dependence: Secondary | ICD-10-CM | POA: Insufficient documentation

## 2024-07-29 DIAGNOSIS — E66811 Obesity, class 1: Secondary | ICD-10-CM | POA: Insufficient documentation

## 2024-07-29 DIAGNOSIS — K219 Gastro-esophageal reflux disease without esophagitis: Secondary | ICD-10-CM | POA: Diagnosis not present

## 2024-07-29 DIAGNOSIS — F32A Depression, unspecified: Secondary | ICD-10-CM | POA: Insufficient documentation

## 2024-07-29 DIAGNOSIS — I739 Peripheral vascular disease, unspecified: Secondary | ICD-10-CM | POA: Diagnosis not present

## 2024-07-29 DIAGNOSIS — Z89412 Acquired absence of left great toe: Secondary | ICD-10-CM | POA: Insufficient documentation

## 2024-07-29 DIAGNOSIS — R4182 Altered mental status, unspecified: Secondary | ICD-10-CM | POA: Diagnosis present

## 2024-07-29 DIAGNOSIS — R197 Diarrhea, unspecified: Secondary | ICD-10-CM | POA: Diagnosis not present

## 2024-07-29 DIAGNOSIS — Z992 Dependence on renal dialysis: Secondary | ICD-10-CM | POA: Insufficient documentation

## 2024-07-29 DIAGNOSIS — Z6837 Body mass index (BMI) 37.0-37.9, adult: Secondary | ICD-10-CM | POA: Insufficient documentation

## 2024-07-29 DIAGNOSIS — I10 Essential (primary) hypertension: Secondary | ICD-10-CM | POA: Diagnosis present

## 2024-07-29 DIAGNOSIS — J449 Chronic obstructive pulmonary disease, unspecified: Secondary | ICD-10-CM | POA: Insufficient documentation

## 2024-07-29 DIAGNOSIS — Z7982 Long term (current) use of aspirin: Secondary | ICD-10-CM | POA: Insufficient documentation

## 2024-07-29 DIAGNOSIS — E785 Hyperlipidemia, unspecified: Secondary | ICD-10-CM | POA: Insufficient documentation

## 2024-07-29 DIAGNOSIS — E1122 Type 2 diabetes mellitus with diabetic chronic kidney disease: Secondary | ICD-10-CM | POA: Insufficient documentation

## 2024-07-29 DIAGNOSIS — M25562 Pain in left knee: Secondary | ICD-10-CM | POA: Insufficient documentation

## 2024-07-29 DIAGNOSIS — D509 Iron deficiency anemia, unspecified: Secondary | ICD-10-CM | POA: Insufficient documentation

## 2024-07-29 DIAGNOSIS — I050 Rheumatic mitral stenosis: Secondary | ICD-10-CM | POA: Diagnosis not present

## 2024-07-29 DIAGNOSIS — R9431 Abnormal electrocardiogram [ECG] [EKG]: Secondary | ICD-10-CM | POA: Insufficient documentation

## 2024-07-29 DIAGNOSIS — I5032 Chronic diastolic (congestive) heart failure: Secondary | ICD-10-CM | POA: Diagnosis not present

## 2024-07-29 DIAGNOSIS — G9340 Encephalopathy, unspecified: Secondary | ICD-10-CM | POA: Diagnosis not present

## 2024-07-29 DIAGNOSIS — I132 Hypertensive heart and chronic kidney disease with heart failure and with stage 5 chronic kidney disease, or end stage renal disease: Secondary | ICD-10-CM | POA: Diagnosis not present

## 2024-07-29 DIAGNOSIS — E1142 Type 2 diabetes mellitus with diabetic polyneuropathy: Secondary | ICD-10-CM

## 2024-07-29 DIAGNOSIS — M1A39X0 Chronic gout due to renal impairment, multiple sites, without tophus (tophi): Secondary | ICD-10-CM | POA: Diagnosis present

## 2024-07-29 DIAGNOSIS — E875 Hyperkalemia: Secondary | ICD-10-CM

## 2024-07-29 DIAGNOSIS — J45909 Unspecified asthma, uncomplicated: Secondary | ICD-10-CM | POA: Insufficient documentation

## 2024-07-29 HISTORY — DX: Acute diastolic (congestive) heart failure: I50.31

## 2024-07-29 LAB — I-STAT CG4 LACTIC ACID, ED: Lactic Acid, Venous: 2.9 mmol/L (ref 0.5–1.9)

## 2024-07-29 LAB — COMPREHENSIVE METABOLIC PANEL WITH GFR
ALT: 27 U/L (ref 0–44)
AST: 26 U/L (ref 15–41)
Albumin: 4.1 g/dL (ref 3.5–5.0)
Alkaline Phosphatase: 270 U/L — ABNORMAL HIGH (ref 38–126)
Anion gap: 19 — ABNORMAL HIGH (ref 5–15)
BUN: 40 mg/dL — ABNORMAL HIGH (ref 8–23)
CO2: 25 mmol/L (ref 22–32)
Calcium: 10.1 mg/dL (ref 8.9–10.3)
Chloride: 93 mmol/L — ABNORMAL LOW (ref 98–111)
Creatinine, Ser: 6.98 mg/dL — ABNORMAL HIGH (ref 0.44–1.00)
GFR, Estimated: 6 mL/min — ABNORMAL LOW
Glucose, Bld: 236 mg/dL — ABNORMAL HIGH (ref 70–99)
Potassium: 5.5 mmol/L — ABNORMAL HIGH (ref 3.5–5.1)
Sodium: 136 mmol/L (ref 135–145)
Total Bilirubin: 0.3 mg/dL (ref 0.0–1.2)
Total Protein: 6.9 g/dL (ref 6.5–8.1)

## 2024-07-29 LAB — TROPONIN T, HIGH SENSITIVITY
Troponin T High Sensitivity: 235 ng/L (ref 0–19)
Troponin T High Sensitivity: 216 ng/L (ref 0–19)

## 2024-07-29 LAB — I-STAT VENOUS BLOOD GAS, ED
Acid-Base Excess: 5 mmol/L — ABNORMAL HIGH (ref 0.0–2.0)
Bicarbonate: 30.6 mmol/L — ABNORMAL HIGH (ref 20.0–28.0)
Calcium, Ion: 1.04 mmol/L — ABNORMAL LOW (ref 1.15–1.40)
HCT: 36 % (ref 36.0–46.0)
Hemoglobin: 12.2 g/dL (ref 12.0–15.0)
O2 Saturation: 99 %
Potassium: 5.1 mmol/L (ref 3.5–5.1)
Sodium: 135 mmol/L (ref 135–145)
TCO2: 32 mmol/L (ref 22–32)
pCO2, Ven: 47.2 mmHg (ref 44–60)
pH, Ven: 7.419 (ref 7.25–7.43)
pO2, Ven: 141 mmHg — ABNORMAL HIGH (ref 32–45)

## 2024-07-29 LAB — CBC WITH DIFFERENTIAL/PLATELET
Abs Immature Granulocytes: 0.03 K/uL (ref 0.00–0.07)
Basophils Absolute: 0 K/uL (ref 0.0–0.1)
Basophils Relative: 0 %
Eosinophils Absolute: 0.2 K/uL (ref 0.0–0.5)
Eosinophils Relative: 3 %
HCT: 37.7 % (ref 36.0–46.0)
Hemoglobin: 11.1 g/dL — ABNORMAL LOW (ref 12.0–15.0)
Immature Granulocytes: 0 %
Lymphocytes Relative: 22 %
Lymphs Abs: 1.5 K/uL (ref 0.7–4.0)
MCH: 27 pg (ref 26.0–34.0)
MCHC: 29.4 g/dL — ABNORMAL LOW (ref 30.0–36.0)
MCV: 91.7 fL (ref 80.0–100.0)
Monocytes Absolute: 0.6 K/uL (ref 0.1–1.0)
Monocytes Relative: 9 %
Neutro Abs: 4.6 K/uL (ref 1.7–7.7)
Neutrophils Relative %: 66 %
Platelets: 160 K/uL (ref 150–400)
RBC: 4.11 MIL/uL (ref 3.87–5.11)
RDW: 16.3 % — ABNORMAL HIGH (ref 11.5–15.5)
WBC: 6.9 K/uL (ref 4.0–10.5)
nRBC: 0 % (ref 0.0–0.2)

## 2024-07-29 LAB — TSH: TSH: 0.858 u[IU]/mL (ref 0.350–4.500)

## 2024-07-29 LAB — LACTIC ACID, PLASMA: Lactic Acid, Venous: 2.4 mmol/L (ref 0.5–1.9)

## 2024-07-29 LAB — ETHANOL: Alcohol, Ethyl (B): <15 mg/dL

## 2024-07-29 LAB — PROCALCITONIN: Procalcitonin: 0.72 ng/mL

## 2024-07-29 LAB — HEPATITIS B SURFACE ANTIGEN: Hepatitis B Surface Ag: NONREACTIVE

## 2024-07-29 MED ORDER — POLYETHYLENE GLYCOL 3350 17 G PO PACK: 17.0000 g | PACK | Freq: Every day | ORAL | Status: DC | PRN

## 2024-07-29 MED ORDER — SODIUM CHLORIDE 0.9% FLUSH
3.0000 mL | Freq: Two times a day (BID) | INTRAVENOUS | Status: DC
  Administered 2024-07-29 – 2024-08-04 (×12): 3 mL via INTRAVENOUS

## 2024-07-29 MED ORDER — HEPARIN SODIUM (PORCINE) 1000 UNIT/ML DIALYSIS: 1000.0000 [IU] | INTRAMUSCULAR | Status: DC | PRN

## 2024-07-29 MED ORDER — DICLOFENAC SODIUM 1 % EX GEL
4.0000 g | Freq: Four times a day (QID) | CUTANEOUS | Status: DC
  Administered 2024-07-29 – 2024-08-04 (×16): 4 g via TOPICAL
  Filled 2024-07-29: qty 100

## 2024-07-29 MED ORDER — LACTATED RINGERS IV BOLUS
500.0000 mL | Freq: Once | INTRAVENOUS | Status: AC
  Administered 2024-07-29: 500 mL via INTRAVENOUS

## 2024-07-29 MED ORDER — INSULIN ASPART 100 UNIT/ML IJ SOLN
0.0000 [IU] | Freq: Three times a day (TID) | INTRAMUSCULAR | Status: DC
  Administered 2024-07-30: 1 [IU] via SUBCUTANEOUS
  Administered 2024-07-30 (×2): 2 [IU] via SUBCUTANEOUS
  Administered 2024-07-31 (×2): 1 [IU] via SUBCUTANEOUS
  Filled 2024-07-29: qty 1
  Filled 2024-07-29: qty 3
  Filled 2024-07-29 (×3): qty 1

## 2024-07-29 MED ORDER — HEPARIN SODIUM (PORCINE) 5000 UNIT/ML IJ SOLN
5000.0000 [IU] | Freq: Three times a day (TID) | INTRAMUSCULAR | Status: DC
  Administered 2024-07-29 – 2024-08-04 (×16): 5000 [IU] via SUBCUTANEOUS
  Filled 2024-07-29 (×16): qty 1

## 2024-07-29 MED ORDER — ALBUTEROL SULFATE (2.5 MG/3ML) 0.083% IN NEBU: 3.0000 mL | INHALATION_SOLUTION | RESPIRATORY_TRACT | Status: DC | PRN

## 2024-07-29 MED ORDER — LACTATED RINGERS IV SOLN: INTRAVENOUS | Status: DC

## 2024-07-29 MED ORDER — NEPRO/CARBSTEADY PO LIQD
237.0000 mL | ORAL | Status: DC | PRN
  Filled 2024-07-29: qty 237

## 2024-07-29 MED ORDER — LIDOCAINE-PRILOCAINE 2.5-2.5 % EX CREA: 1.0000 | TOPICAL_CREAM | CUTANEOUS | Status: DC | PRN

## 2024-07-29 MED ORDER — ANTICOAGULANT SODIUM CITRATE 4% (200MG/5ML) IV SOLN
5.0000 mL | Status: DC | PRN
  Filled 2024-07-29: qty 5

## 2024-07-29 MED ORDER — LIDOCAINE HCL (PF) 1 % IJ SOLN: 5.0000 mL | INTRAMUSCULAR | Status: DC | PRN

## 2024-07-29 MED ORDER — LACTATED RINGERS IV BOLUS (SEPSIS)
500.0000 mL | Freq: Once | INTRAVENOUS | Status: AC
  Administered 2024-07-29: 500 mL via INTRAVENOUS

## 2024-07-29 MED ORDER — PANTOPRAZOLE SODIUM 40 MG PO TBEC
40.0000 mg | DELAYED_RELEASE_TABLET | Freq: Every day | ORAL | Status: DC
  Administered 2024-07-30 – 2024-08-04 (×6): 40 mg via ORAL
  Filled 2024-07-29 (×4): qty 1
  Filled 2024-07-29: qty 2
  Filled 2024-07-29: qty 1

## 2024-07-29 MED ORDER — LIDOCAINE 5 % EX PTCH
2.0000 | MEDICATED_PATCH | Freq: Every day | CUTANEOUS | Status: DC
  Administered 2024-07-29 – 2024-07-30 (×2): 2 via TRANSDERMAL
  Filled 2024-07-29 (×4): qty 2

## 2024-07-29 MED ORDER — NALOXONE HCL 0.4 MG/ML IJ SOLN
0.4000 mg | Freq: Once | INTRAMUSCULAR | Status: AC
  Administered 2024-07-29: 0.4 mg via INTRAVENOUS
  Filled 2024-07-29: qty 1

## 2024-07-29 MED ORDER — ATORVASTATIN CALCIUM 80 MG PO TABS
80.0000 mg | ORAL_TABLET | Freq: Every day | ORAL | Status: DC
  Administered 2024-07-29 – 2024-08-03 (×6): 80 mg via ORAL
  Filled 2024-07-29 (×6): qty 1

## 2024-07-29 MED ORDER — SODIUM CHLORIDE 0.9 % IV SOLN
2.0000 g | Freq: Once | INTRAVENOUS | Status: AC
  Administered 2024-07-29: 2 g via INTRAVENOUS
  Filled 2024-07-29: qty 12.5

## 2024-07-29 MED ORDER — ALTEPLASE 2 MG IJ SOLR: 2.0000 mg | Freq: Once | INTRAMUSCULAR | Status: DC | PRN

## 2024-07-29 MED ORDER — PENTAFLUOROPROP-TETRAFLUOROETH EX AERO: 1.0000 | INHALATION_SPRAY | CUTANEOUS | Status: DC | PRN

## 2024-07-29 MED ORDER — HEPARIN SODIUM (PORCINE) 1000 UNIT/ML DIALYSIS: 1500.0000 [IU] | INTRAMUSCULAR | Status: AC | PRN

## 2024-07-29 MED ORDER — VANCOMYCIN HCL IN DEXTROSE 1-5 GM/200ML-% IV SOLN: 1000.0000 mg | Freq: Once | INTRAVENOUS | Status: DC

## 2024-07-29 MED ORDER — CHLORHEXIDINE GLUCONATE CLOTH 2 % EX PADS
6.0000 | MEDICATED_PAD | Freq: Every day | CUTANEOUS | Status: DC
  Administered 2024-07-30 – 2024-07-31 (×2): 6 via TOPICAL

## 2024-07-29 MED ORDER — HEPARIN SODIUM (PORCINE) 1000 UNIT/ML DIALYSIS
2500.0000 [IU] | Freq: Once | INTRAMUSCULAR | Status: AC
  Administered 2024-08-01: 2500 [IU] via INTRAVENOUS_CENTRAL

## 2024-07-29 MED ORDER — ASPIRIN 81 MG PO TBEC
81.0000 mg | DELAYED_RELEASE_TABLET | Freq: Every day | ORAL | Status: DC
  Administered 2024-07-30 – 2024-08-04 (×6): 81 mg via ORAL
  Filled 2024-07-29 (×6): qty 1

## 2024-07-29 MED ORDER — METRONIDAZOLE 500 MG/100ML IV SOLN
500.0000 mg | Freq: Once | INTRAVENOUS | Status: AC
  Administered 2024-07-29: 500 mg via INTRAVENOUS
  Filled 2024-07-29: qty 100

## 2024-07-29 MED ORDER — VANCOMYCIN HCL 2000 MG/400ML IV SOLN
2000.0000 mg | Freq: Once | INTRAVENOUS | Status: AC
  Administered 2024-07-29: 2000 mg via INTRAVENOUS
  Filled 2024-07-29: qty 400

## 2024-07-29 MED ORDER — ACETAMINOPHEN 325 MG PO TABS
650.0000 mg | ORAL_TABLET | Freq: Four times a day (QID) | ORAL | Status: DC | PRN
  Administered 2024-07-29: 650 mg via ORAL
  Filled 2024-07-29: qty 2

## 2024-07-29 MED ORDER — ANTICOAGULANT SODIUM CITRATE 4% (200MG/5ML) IV SOLN: 5.0000 mL | Status: DC | PRN

## 2024-07-29 MED ORDER — DICLOFENAC SODIUM 1 % EX GEL
4.0000 g | Freq: Four times a day (QID) | CUTANEOUS | Status: DC
  Filled 2024-07-29: qty 100

## 2024-07-29 MED ORDER — ACETAMINOPHEN 650 MG RE SUPP: 650.0000 mg | Freq: Four times a day (QID) | RECTAL | Status: DC | PRN

## 2024-07-29 NOTE — H&P (Addendum)
 " History and Physical   [name] FMW:[id number] DOB: Apr 04, 1956 DOA: 07/29/2024  PCP: [facility] [address]   Patient coming from: Home/dialysis  Chief Complaint: Altered mental status  HPI: [name] is a 69 y.o. female with medical history significant of hypertension, hyperlipidemia, diabetes, neuropathy, CAD, QT prolongation, PAD, GERD, chronic diastolic CHF, ESRD on HD, depression, asthma, obesity, OSA, COPD, gout, hepatitis C, anemia presenting with altered mental status.  History obtained assistance of chart review. Patient was reportedly at her Baseline this morning, including when her daughter got her in the car to take her to her dialysis.  However, on arrival at dialysis patient slumped out of car and was less responsive.  EMS was called and patient found to have blood sugar 199 and continued to be slow to respond eating additional prompting to respond.  No focal deficits appreciated.  Patient remains arousable.  She reports that when she is in the car she does remember her legs giving out but does not recall any other specific symptoms.  Denies fevers, chills, chest pain, shortness of breath, abdominal pain, constipation, diarrhea, nausea, vomiting.  ED Course: Vital signs in the ED notable for blood pressure in the 90s-120s systolic.  Lab workup included CMP with potassium 5.5, chloride 93, BUN 40, creatinine 6.98, normal bicarb, gap 19, glucose 236, alk phos 270.  CBC notable for hemoglobin stable 11.1.  Troponin elevated to 235, repeat pending.  Lactic acid mildly elevated to 2.9, repeat pending.  Ethanol level pending.  Blood cultures pending.  VBG with normal pH and normal pCO2.  Hepatitis B labs pending.  Chest x-Linck showed increased interstitial markings which could represent edema versus atypical infection.  CT head showed no acute abnormality.  Patient received vancomycin , cefepime , Flagyl  in the ED.  Also received 1 L IV fluids and started on a rate of fluids.  Review  of Systems: As per HPI otherwise all other systems reviewed and are negative.  Past Medical History:  Diagnosis Date   Acute diastolic CHF (congestive heart failure) (HCC)    Acute encephalopathy 10/20/2022   Acute metabolic encephalopathy 06/02/2017   Acute on chronic diastolic CHF (congestive heart failure) (HCC) 10/03/2017   Acute pulmonary edema (HCC) 09/14/2020   Acute respiratory failure with hypoxia (HCC) 09/13/2020   Anemia    Aortic stenosis    Echo 8/18: mean 13, peak 28, LVOT/AV mean velocity 0.51   Arthritis    Asthma    As a child    Bronchitis    CAD (coronary artery disease)    a. 09/2016: 50% Ost 1st Mrg stenosis, 50% 2nd Mrg stenosis, 20% Mid-Cx, 95% Prox LAD, 40% mid-LAD, and 10% dist-LAD stenosis. Staged PCI with DES to Prox-LAD.    Chronic combined systolic and diastolic CHF (congestive heart failure) (HCC) 2011   echo 2/18: EF 55-60, normal wall motion, grade 2 diastolic dysfunction, trivial AI // echo 3/18: Septal and apical HK, EF 45-50, normal wall motion, trivial AI, mild LAE, PASP 38 // echo 8/18: EF 60-65, normal wall motion, grade 1 diastolic dysfunction, calcified aortic valve leaflets, mild aortic stenosis (mean 13, peak 28, LVOT/AV mean velocity 0.51), mild AI, moderate MAC, mild LAE, trivial TR    Complication of anesthesia    Depression    Diabetes mellitus Dx 1989   Diabetic hyperosmolar non-ketotic state (HCC) 12/13/2016   Elevated lipids    ESRD on hemodialysis (HCC)    TTS at [facility]   GERD (gastroesophageal reflux disease)  GI bleed 09/17/2021   Gout    Heart murmur    asymptomatic   Hepatitis C Dx 2013   Hypertension Dx 1989   Hypervolemia associated with renal insufficiency 10/31/2019   Infected surgical wound    Lt arm   Myocardial infarction (HCC) 07/2015   Obesity    Pancreatitis 2013   Pneumonia    Refusal of blood transfusions as patient is Jehovah's Witness    pt states she is not jehovas witness and does not refuse blood products    Severe sepsis (HCC) 04/21/2021   Tendinitis    Tremors of nervous system    LEFT HAND   Ulcer 2010    Past Surgical History:  Procedure Laterality Date   A/V FISTULAGRAM Left 04/11/2019   Procedure: A/V FISTULAGRAM;  Surgeon: Jama Cordella MATSU, MD;  Location: [facility];  Service: Cardiovascular;  Laterality: Left;   A/V FISTULAGRAM Left 06/02/2019   Procedure: A/V FISTULAGRAM;  Surgeon: Jama Cordella MATSU, MD;  Location: [facility];  Service: Cardiovascular;  Laterality: Left;   AMPUTATION Left 10/28/2022   Procedure: LEFT FOOT 1ST [place] AMPUTATION;  Surgeon: Harden Jerona GAILS, MD;  Location: [facility] OR;  Service: Orthopedics;  Laterality: Left;   APPLICATION OF WOUND VAC Left 08/14/2017   Procedure: APPLICATION OF WOUND VAC Exchange;  Surgeon: Dessa Reyes ORN, MD;  Location: [facility] ORS;  Service: General;  Laterality: Left;   APPLICATION OF WOUND VAC Left 12/21/2018   Procedure: APPLICATION OF WOUND VAC;  Surgeon: Jama Cordella MATSU, MD;  Location: [facility] ORS;  Service: Vascular;  Laterality: Left;   AV FISTULA PLACEMENT Left 08/19/2018   Procedure: ARTERIOVENOUS (AV) FISTULA CREATION ( BRACHIOBASILIC );  Surgeon: Jama Cordella MATSU, MD;  Location: [facility] OR[facility];  Service: Vascular;  Laterality: Left;   BASCILIC VEIN TRANSPOSITION Left 11/18/2018   Procedure: BASCILIC VEIN TRANSPOSITION;  Surgeon: Jama Cordella MATSU, MD;  Location: [facility] ORS;  Service: Vascular;  Laterality: Left;   BIOPSY  09/20/2020   Procedure: BIOPSY;  Surgeon: Rollin Dover, MD;  Location: [facility] ENDOSCOPY;  Service: Endoscopy;;   CHOLECYSTECTOMY     COLONOSCOPY WITH PROPOFOL  N/A 02/03/2018   Procedure: COLONOSCOPY WITH PROPOFOL ;  Surgeon: Unk Corinn Skiff, MD;  Location: [facility] ENDOSCOPY;  Service: Gastroenterology;  Laterality: N/A;   COLONOSCOPY WITH PROPOFOL  N/A 07/25/2022   Procedure: COLONOSCOPY WITH PROPOFOL ;  Surgeon: Maryruth Ole DASEN, MD;  Location: [facility] ENDOSCOPY;  Service: Endoscopy;  Laterality: N/A;    CORONARY ANGIOPLASTY  07/2015   STENT   CORONARY STENT INTERVENTION N/A 09/18/2016   Procedure: Coronary Stent Intervention;  Surgeon: Debby DELENA Sor, MD;  Location: [facility];  Service: Cardiovascular;  Laterality: N/A;   DIALYSIS/PERMA CATHETER INSERTION N/A 05/10/2018   Procedure: DIALYSIS/PERMA CATHETER INSERTION;  Surgeon: Jama Cordella MATSU, MD;  Location: [facility];  Service: Cardiovascular;  Laterality: N/A;   DIALYSIS/PERMA CATHETER INSERTION N/A 01/21/2024   Procedure: DIALYSIS/PERMA CATHETER INSERTION;  Surgeon: Lanis Fonda BRAVO, MD;  Location: [facility];  Service: Cardiovascular;  Laterality: N/A;   DRESSING CHANGE UNDER ANESTHESIA Left 08/15/2017   Procedure: exploration of wound for bleeding;  Surgeon: Dessa Reyes ORN, MD;  Location: [facility] ORS;  Service: General;  Laterality: Left;   ESOPHAGOGASTRODUODENOSCOPY (EGD) WITH PROPOFOL  N/A 02/03/2018   Procedure: ESOPHAGOGASTRODUODENOSCOPY (EGD) WITH PROPOFOL ;  Surgeon: Unk Corinn Skiff, MD;  Location: [facility] ENDOSCOPY;  Service: Gastroenterology;  Laterality: N/A;   ESOPHAGOGASTRODUODENOSCOPY (EGD) WITH PROPOFOL  N/A 09/20/2020   Procedure: ESOPHAGOGASTRODUODENOSCOPY (EGD) WITH  PROPOFOL ;  Surgeon: Rollin Dover, MD;  Location: [facility] ENDOSCOPY;  Service: Endoscopy;  Laterality: N/A;   ESOPHAGOGASTRODUODENOSCOPY (EGD) WITH PROPOFOL  N/A 07/25/2022   Procedure: ESOPHAGOGASTRODUODENOSCOPY (EGD) WITH PROPOFOL ;  Surgeon: Maryruth Ole DASEN, MD;  Location: [facility] ENDOSCOPY;  Service: Endoscopy;  Laterality: N/A;   EYE SURGERY  11/17/2018   INCISION AND DRAINAGE ABSCESS Left 08/12/2017   Procedure: INCISION AND DRAINAGE ABSCESS;  Surgeon: Dessa Reyes ORN, MD;  Location: [facility] ORS;  Service: General;  Laterality: Left;   KNEE ARTHROSCOPY     LEFT HEART CATH N/A 09/18/2016   Procedure: Left Heart Cath;  Surgeon: Debby DELENA Sor, MD;  Location: [facility];  Service: Cardiovascular;  Laterality: N/A;   LEFT HEART CATH AND CORONARY  ANGIOGRAPHY N/A 09/16/2016   Procedure: Left Heart Cath and Coronary Angiography;  Surgeon: Lonni JONETTA Cash, MD;  Location: [facility] INVASIVE CV LAB;  Service: Cardiovascular;  Laterality: N/A;   LEFT HEART CATH AND CORONARY ANGIOGRAPHY N/A 04/29/2017   Procedure: LEFT HEART CATH AND CORONARY ANGIOGRAPHY;  Surgeon: Mady Lonni, MD;  Location: [facility];  Service: Cardiovascular;  Laterality: N/A;   LOWER EXTREMITY ANGIOGRAPHY Right 03/08/2018   Procedure: LOWER EXTREMITY ANGIOGRAPHY;  Surgeon: Jama Cordella MATSU, MD;  Location: [facility];  Service: Cardiovascular;  Laterality: Right;   PERIPHERAL VASCULAR THROMBECTOMY  01/21/2024   Procedure: PERIPHERAL VASCULAR THROMBECTOMY;  Surgeon: Lanis Fonda BRAVO, MD;  Location: [facility];  Service: Cardiovascular;;   TUBAL LIGATION     TUBAL LIGATION     UPPER EXTREMITY ANGIOGRAPHY Right 09/19/2019   Procedure: UPPER EXTREMITY ANGIOGRAPHY;  Surgeon: Jama Cordella MATSU, MD;  Location: [facility];  Service: Cardiovascular;  Laterality: Right;   VENOUS STENT  01/21/2024   Procedure: VENOUS STENT;  Surgeon: Lanis Fonda BRAVO, MD;  Location: [facility];  Service: Cardiovascular;;   WOUND DEBRIDEMENT Left 12/21/2018   Procedure: DEBRIDEMENT WOUND;  Surgeon: Jama Cordella MATSU, MD;  Location: [facility] ORS;  Service: Vascular;  Laterality: Left;   WOUND DEBRIDEMENT Left 12/30/2018   Procedure: DEBRIDEMENT WOUND WITH VAC PLACEMENT (LEFT UPPER EXTREMITY);  Surgeon: Jama Cordella MATSU, MD;  Location: [facility] OR[facility];  Service: Vascular;  Laterality: Left;    Social History  reports that she quit smoking about 43 years ago. Her smoking use included cigarettes. She started smoking about 49 years ago. She has a 1.5 pack-year smoking history. She has never used smokeless tobacco. She reports that she does not currently use alcohol. She reports that she does not currently use drugs after having used the following drugs:  Marijuana.  Allergies[1]  Family History  Problem Relation Age of Onset   Colon cancer Mother    Heart attack Other    Heart attack Maternal Grandmother    Hypertension Sister    Hypertension Brother    Diabetes Paternal Grandmother    Breast cancer Neg Hx   Reviewed on admission  Prior to Admission medications  Medication Sig Start Date End Date Taking? Authorizing Provider  albuterol  (VENTOLIN  HFA) 108 (90 Base) MCG/ACT inhaler Inhale 2 puffs into the lungs every 4 (four) hours as needed for wheezing or shortness of breath. 06/28/20   Vivienne Delon HERO, PA-C  aspirin  81 MG EC tablet Take 1 tablet (81 mg total) by mouth daily. 09/20/20   Singh, Prashant K, MD  atorvastatin  (LIPITOR ) 80 MG tablet Take 1 tablet (80 mg total) by mouth every evening. Patient taking differently: Take 80 mg by mouth at  bedtime. 04/24/21   Bacigalupo, Angela M, MD  cinacalcet  (SENSIPAR ) 30 MG tablet Take 30 mg by mouth daily. Patient not taking: Reported on 06/01/2024 04/11/24   [provider]  diclofenac  Sodium (VOLTAREN ) 1 % GEL Apply 2 g topically in the morning, at noon, and at bedtime. Left and right knee    [provider]  fluticasone  (FLONASE ) 50 MCG/ACT nasal spray Place 2 sprays into both nostrils daily as needed for allergies or rhinitis. 09/28/17   Vivienne Delon HERO, PA-C  fluticasone  furoate-vilanterol (BREO ELLIPTA ) 200-25 MCG/INH AEPB Inhale 1 puff into the lungs daily. 08/30/20   Vivienne Delon HERO, PA-C  HYDROcodone -acetaminophen  (NORCO/VICODIN) 5-325 MG tablet Take 1 tablet by mouth every 6 (six) hours as needed for up to 12 doses. 07/11/24   Neysa Thersia RAMAN, PA-C  hydrOXYzine  (ATARAX ) 25 MG tablet Take 1 tablet (25 mg total) by mouth 2 (two) times daily. 11/17/22   Hongalgi, Anand D, MD  lidocaine  (LIDODERM ) 5 % Place 1 patch onto the skin daily. Remove & Discard patch within 12 hours or as directed by MD Patient not taking: Reported on 06/01/2024 06/25/23   Palumbo,  April, MD  midodrine  (PROAMATINE ) 10 MG tablet Take 1 tablet (10 mg total) by mouth 3 (three) times daily with meals. 06/02/24   Harrie Bruckner, DO  naloxone  (NARCAN ) nasal spray 4 mg/0.1 mL Place 1 spray into the nose daily as needed (for overdose). 04/08/22   [provider]  nitroGLYCERIN  (NITROSTAT ) 0.4 MG SL tablet Place 1 tablet (0.4 mg total) under the tongue every 5 (five) minutes x 3 doses as needed for chest pain. Patient not taking: Reported on 06/01/2024 03/08/24   Howell Lunger, DO  Oxycodone  HCl 10 MG TABS Take 1 tablet (10 mg total) by mouth 3 (three) times daily. 05/12/24   Smucker, Melvenia, MD  OZEMPIC, 2 MG/DOSE, 8 MG/3ML SOPN Inject 2 mg into the skin once a week.    [provider]  pantoprazole  (PROTONIX ) 40 MG tablet Take 40 mg by mouth daily before breakfast.    [provider]  polyethylene glycol powder (GLYCOLAX /MIRALAX ) 17 GM/SCOOP powder Dissolve 1 capful (17g) in 4-8 ounces of liquid and take by mouth daily as needed for mild constipation 03/30/24   Gomes, Adriana, DO  pregabalin  (LYRICA ) 100 MG capsule Take 1 capsule (100 mg total) by mouth 2 (two) times daily. 05/12/24   Smucker, Melvenia, MD  senna (SENOKOT) 8.6 MG TABS tablet Take 1 tablet (8.6 mg total) by mouth 2 (two) times daily. 03/30/24   Gomes, Adriana, DO  sertraline  (ZOLOFT ) 25 MG tablet Take 25 mg by mouth daily. 05/22/24   [provider]  torsemide  (DEMADEX ) 100 MG tablet Take 1 tablet (100mg ) on NON-dialysis days (Sunday, Monday, Wednesday, Friday) 03/08/24   Everhart, Kirstie, DO  valACYclovir  (VALTREX ) 500 MG tablet TAKE (1) TABLET BY MOUTH EVERY OTHER DAY. Patient taking differently: Take 500 mg by mouth every other day. 01/24/21   Bacigalupo, Angela M, MD  VELPHORO  500 MG chewable tablet Take 1 tablet with meals and snacks 05/12/24   Napoleon Melvenia, MD    Physical Exam: Vitals:   07/29/24 1245 07/29/24 1330 07/29/24 1400 07/29/24 1500  BP: (!) 94/39  104/64 (!) 104/57 (!) 124/58  Pulse:  76 76 75  Resp: 13 12 11 13   Temp:      TempSrc:      SpO2:  97% 100% 100%  Weight:      Height:  Physical Exam Constitutional:      General: She is not in acute distress.    Appearance: Normal appearance.  HENT:     Head: Normocephalic and atraumatic.     Mouth/Throat:     Mouth: Mucous membranes are moist.     Pharynx: Oropharynx is clear.  Eyes:     Extraocular Movements: Extraocular movements intact.     Pupils: Pupils are equal, round, and reactive to light.  Cardiovascular:     Rate and Rhythm: Normal rate and regular rhythm.     Pulses: Normal pulses.     Heart sounds: Normal heart sounds.  Pulmonary:     Effort: Pulmonary effort is normal. No respiratory distress.     Breath sounds: Normal breath sounds.  Abdominal:     General: Bowel sounds are normal. There is no distension.     Palpations: Abdomen is soft.     Tenderness: There is no abdominal tenderness.  Musculoskeletal:        General: No swelling or deformity.  Skin:    General: Skin is warm and dry.  Neurological:     General: No focal deficit present.     Comments: Alert and oriented when seen.    Labs on Admission: I have personally reviewed following labs and imaging studies  CBC: Recent Labs  Lab 07/29/24 1320 07/29/24 1400  WBC  --  6.9  NEUTROABS  --  4.6  HGB 12.2 11.1*  HCT 36.0 37.7  MCV  --  91.7  PLT  --  160    Basic Metabolic Panel: Recent Labs  Lab 07/29/24 1250 07/29/24 1320  NA 136 135  K 5.5* 5.1  CL 93*  --   CO2 25  --   GLUCOSE 236*  --   BUN 40*  --   CREATININE 6.98*  --   CALCIUM  10.1  --     GFR: Estimated Creatinine Clearance: 9.5 mL/min (A) (by C-G formula based on SCr of 6.98 mg/dL (H)).  Liver Function Tests: Recent Labs  Lab 07/29/24 1250  AST 26  ALT 27  ALKPHOS 270*  BILITOT 0.3  PROT 6.9  ALBUMIN  4.1    Urine analysis:    Component Value Date/Time   COLORURINE YELLOW 10/20/2022 1625    APPEARANCEUR CLEAR 10/20/2022 1625   LABSPEC 1.014 10/20/2022 1625   PHURINE 6.0 10/20/2022 1625   GLUCOSEU 150 (A) 10/20/2022 1625   HGBUR SMALL (A) 10/20/2022 1625   BILIRUBINUR NEGATIVE 10/20/2022 1625   BILIRUBINUR neg 12/17/2016 1538   KETONESUR NEGATIVE 10/20/2022 1625   PROTEINUR 100 (A) 10/20/2022 1625   UROBILINOGEN 0.2 12/17/2016 1538   UROBILINOGEN 0.2 05/29/2010 2100   NITRITE NEGATIVE 10/20/2022 1625   LEUKOCYTESUR NEGATIVE 10/20/2022 1625    Radiological Exams on Admission: CT Head Wo Contrast Result Date: 07/29/2024 EXAM: CT HEAD WITHOUT CONTRAST 07/29/2024 02:23:19 PM TECHNIQUE: CT of the head was performed without the administration of intravenous contrast. Automated exposure control, iterative reconstruction, and/or weight based adjustment of the mA/kV was utilized to reduce the radiation dose to as low as reasonably achievable. COMPARISON: 10/20/2022 CLINICAL HISTORY: Mental status change, unknown cause FINDINGS: BRAIN AND VENTRICLES: No acute hemorrhage. No evidence of acute infarct. No hydrocephalus. No extra-axial collection. No mass effect or midline shift. Septum pellucidum is present. Atrophy and white matter changes are mildly advanced for age, stable. Atherosclerotic calcifications are present in the cavernous carotid arteries bilaterally. No hyperdense vessel is present. ORBITS: No acute abnormality. SINUSES: No acute  abnormality. SOFT TISSUES AND SKULL: No acute soft tissue abnormality. No skull fracture. IMPRESSION: 1. No acute intracranial abnormality. 2. Mildly advanced atrophy and white matter changes for age, stable. Electronically signed by: Lonni Necessary MD 07/29/2024 02:37 PM EST RP Workstation: [id number]   DG Chest Port 1 View Result Date: 07/29/2024 EXAM: 1 VIEW(S) XRAY OF THE CHEST 07/29/2024 01:19:47 PM COMPARISON: 06/21/2024 CLINICAL HISTORY: Altered mental status. FINDINGS: LINES, TUBES AND DEVICES: Vascular stent in left arm. LUNGS AND PLEURA:  Low lung volumes. Increased interstitial markings with differential diagnosis including pulmonary edema versus atypical infection. No pleural effusion. No pneumothorax. HEART AND MEDIASTINUM: Atherosclerotic plaque. No acute abnormality of the cardiac and mediastinal silhouettes. BONES AND SOFT TISSUES: No acute osseous abnormality. IMPRESSION: 1. Increased interstitial markings, differential diagnosis includes pulmonary edema versus atypical infection. 2. Low lung volumes. Electronically signed by: Waddell Calk MD 07/29/2024 02:04 PM EST RP Workstation: [id number]   EKG: Independently reviewed.  Sinus rhythm at 80 bpm.  QTc borderline at 46.  Nonspecific T wave changes.  Assessment/Plan Active Problems:   COPD (chronic obstructive pulmonary disease) (HCC)   ESRD on hemodialysis (HCC)   Essential hypertension   Type 2 diabetes mellitus with chronic kidney disease, with long-term current use of insulin  (HCC)   OSA (obstructive sleep apnea)   Chronic hepatitis C without hepatic coma (HCC)   Diabetic neuropathy (HCC)   GERD (gastroesophageal reflux disease)   Depression   Asthma   Hyperlipidemia   Chronic gout due to renal impairment of multiple sites without tophus   Chronic diastolic heart failure (HCC)   ESRD (end stage renal disease) on dialysis (HCC)   Iron  deficiency anemia, unspecified   PVD (peripheral vascular disease)   BMI 45.0-49.9, adult (HCC)   Prolonged QT interval   Acute encephalopathy   Acute encephalopathy > Patient presenting with acute encephalopathy, apparently started while driving to dialysis center as patient was normal when she got in the car with her daughter but slumped out of the car when she got to dialysis. > Has been able to respond the whole time but has been slow to respond.  No dense confusion, no focal deficits. > Primary concern is for polypharmacy in dialysis patient on Lyrica , opioid pain medication,?Sertraline  > Narcan  not yet tried. > Edema versus  atypical infection on chest x-Berres with elevated lactic acid so patient received antibiotics in the ED.  CBC came back with no leukocytosis.  Lower suspicion for infectious etiology, will check procalcitonin and respiratory viral panel to evaluate likelihood of respiratory bacterial versus viral infection. - Monitor on telemetry overnight - Hold Norco, Lyrica , sertraline  - Dialysis as below - Check TSH, B12 - No true postictal state and no seizure activity, will hold off on EEG - Pupils look okay and patient is protecting her airway, mentation improving, no need for Narcan  at this time - Procalcitonin - Flu COVID and RSV screen  Elevated troponin ?  Type II NSTEMI CAD >  Troponin 235.  EKG normal.  Cardiology consulted in the ED and will see. - Appreciate cardiology recommendations and assistance - Trend troponin - Continue ASA, atorvastatin   Hypertension - Does not appear to be taking torsemide  any longer, will confirm  Hyperlipidemia - Continue home statin  Diabetes - SSI  Neuropathy - Holding Lyrica  as above  PAD - Continue home ASA, atorvastatin   GERD - Continue PPI  Chronic diastolic CHF > Last echo showed EF 65-70%, G1 DD, normal RV function. - Working to confirm still taking  torsemide   ESRD > Missed HD due to encephalopathy as above - Nephrology consulted in the ED,  appreciate recommendations and assistance - Continue with dialysis  Obesity - Noted  OSA - Continue CPAP  Chronic hepatitis C - Noted - LFT stable  Anemia - Stable at 11.1 - Trend CBC  DVT prophylaxis: Heparin  Code Status:   Full Family Communication:  None on admission  Disposition Plan:   Patient is from:  Home  Anticipated DC to:  Home  Anticipated DC date:  1 to 3 days  Anticipated DC barriers: None  Consults called:  Nephrology, cardiology Admission status:  Observation, telemetry  Severity of Illness: The appropriate patient status for this patient is OBSERVATION.  Observation status is judged to be reasonable and necessary in order to provide the required intensity of service to ensure the patient's safety. The patient's presenting symptoms, physical exam findings, and initial radiographic and laboratory data in the context of their medical condition is felt to place them at decreased risk for further clinical deterioration. Furthermore, it is anticipated that the patient will be medically stable for discharge from the hospital within 2 midnights of admission.    Marsa KATHEE Scurry MD [facility]  How to contact the [facility] Attending or Consulting provider 7A - 7P or covering provider during after hours 7P -7A, for this patient?   Check the care team in [facility] and look for a) attending/consulting [facility] provider listed and b) the [facility] team listed Log into [email or web address] and use [place]'s universal password to access. If you do not have the password, please contact the hospital operator. Locate the [facility] provider you are looking for under [facility] and page to a number that you can be directly reached. If you still have difficulty reaching the provider, please page the [facility] (Director on Call) for the Hospitalists listed on amion for assistance.  07/29/2024, 3:24 PM       [1]  Allergies Allergen Reactions   Shellfish Allergy Anaphylaxis and Swelling   Diazepam  Other (See Comments)    felt like out of body experience   Morphine  Itching and Other (See Comments)    Tolerated hydromorphone  on 07/2020 *will take along with Benadryl *   "

## 2024-07-29 NOTE — ED Triage Notes (Signed)
 69 y/o female brought in by [facility]. Pt taken to dialysis by daughter and appeared to be fine in route. After getting to dialysis pt became altered. EMS reports pupils was 3 and reactive but in route they became pinpoint and only responded to painful stimuli.  Vitals BP 74/34 P 76 SPO2 98% 2 liters CBG 199

## 2024-07-29 NOTE — Plan of Care (Signed)
   Problem: Fluid Volume: Goal: Ability to maintain a balanced intake and output will improve Outcome: Progressing

## 2024-07-29 NOTE — ED Provider Notes (Signed)
 " [place] EMERGENCY DEPARTMENT AT [facility] Provider Note   CSN: [id number] Arrival date & time: 07/29/24  1227     Patient presents with: Altered Mental Status   [name] is a 69 y.o. female.   HPI 69 year old female brought in by [facility] with reports of altered mental status.  Per EMS, patient was going to dialysis with her daughter this morning and seemed fine when [name] got in the car.  After they arrived to the dialysis center, patient was weak and slumped out of the car with decreased responsiveness.  EMS reports that her blood sugar was 199.  They report that [name] has had decreased responsiveness but they were able to get her to respond to verbal stimuli [name] has been having less responsiveness requiring more stimuli to respond.  No lateralized weakness was noted.  There was some report that [name] might have a patch of medication and [name] is on buprenorphine .  EMS reported that they spoke with the daughter about coming to the hospital but [name] reported that [name] needed to go to work.  They also noted the patient has a prescription for Narcan     Prior to Admission medications  Medication Sig Start Date End Date Taking? Authorizing Provider  albuterol  (VENTOLIN  HFA) 108 (90 Base) MCG/ACT inhaler Inhale 2 puffs into the lungs every 4 (four) hours as needed for wheezing or shortness of breath. 06/28/20   Vivienne Delon CHRISTELLA, PA-C  aspirin  81 MG EC tablet Take 1 tablet (81 mg total) by mouth daily. 09/20/20   Singh, Prashant K, MD  atorvastatin  (LIPITOR ) 80 MG tablet Take 1 tablet (80 mg total) by mouth every evening. Patient taking differently: Take 80 mg by mouth at bedtime. 04/24/21   Myrla Jon CHRISTELLA, MD  cinacalcet  (SENSIPAR ) 30 MG tablet Take 30 mg by mouth daily. Patient not taking: Reported on 06/01/2024 04/11/24   [provider]  diclofenac  Sodium (VOLTAREN ) 1 % GEL Apply 2 g topically in the morning, at noon, and at bedtime. Left and right knee     [provider]  fluticasone  (FLONASE ) 50 MCG/ACT nasal spray Place 2 sprays into both nostrils daily as needed for allergies or rhinitis. 09/28/17   Vivienne Delon CHRISTELLA, PA-C  fluticasone  furoate-vilanterol (BREO ELLIPTA ) 200-25 MCG/INH AEPB Inhale 1 puff into the lungs daily. 08/30/20   Burnette, Jennifer M, PA-C  HYDROcodone -acetaminophen  (NORCO/VICODIN) 5-325 MG tablet Take 1 tablet by mouth every 6 (six) hours as needed for up to 12 doses. 07/11/24   Neysa Thersia RAMAN, PA-C  hydrOXYzine  (ATARAX ) 25 MG tablet Take 1 tablet (25 mg total) by mouth 2 (two) times daily. 11/17/22   Hongalgi, Anand D, MD  lidocaine  (LIDODERM ) 5 % Place 1 patch onto the skin daily. Remove & Discard patch within 12 hours or as directed by MD Patient not taking: Reported on 06/01/2024 06/25/23   Palumbo, April, MD  midodrine  (PROAMATINE ) 10 MG tablet Take 1 tablet (10 mg total) by mouth 3 (three) times daily with meals. 06/02/24   Harrie Bruckner, DO  naloxone  (NARCAN ) nasal spray 4 mg/0.1 mL Place 1 spray into the nose daily as needed (for overdose). 04/08/22   [provider]  nitroGLYCERIN  (NITROSTAT ) 0.4 MG SL tablet Place 1 tablet (0.4 mg total) under the tongue every 5 (five) minutes x 3 doses as needed for chest pain. Patient not taking: Reported on 06/01/2024 03/08/24   Howell Lunger, DO  Oxycodone  HCl 10 MG TABS Take 1 tablet (  10 mg total) by mouth 3 (three) times daily. 05/12/24   Smucker, Melvenia, MD  OZEMPIC, 2 MG/DOSE, 8 MG/3ML SOPN Inject 2 mg into the skin once a week.    [provider]  pantoprazole  (PROTONIX ) 40 MG tablet Take 40 mg by mouth daily before breakfast.    [provider]  polyethylene glycol powder (GLYCOLAX /MIRALAX ) 17 GM/SCOOP powder Dissolve 1 capful (17g) in 4-8 ounces of liquid and take by mouth daily as needed for mild constipation 03/30/24   Gomes, Adriana, DO  pregabalin  (LYRICA ) 100 MG capsule Take 1 capsule (100 mg total) by mouth 2 (two) times  daily. 05/12/24   Smucker, Melvenia, MD  senna (SENOKOT) 8.6 MG TABS tablet Take 1 tablet (8.6 mg total) by mouth 2 (two) times daily. 03/30/24   Gomes, Adriana, DO  sertraline  (ZOLOFT ) 25 MG tablet Take 25 mg by mouth daily. 05/22/24   [provider]  torsemide  (DEMADEX ) 100 MG tablet Take 1 tablet (100mg ) on NON-dialysis days (Sunday, Monday, Wednesday, Friday) 03/08/24   Everhart, Kirstie, DO  valACYclovir  (VALTREX ) 500 MG tablet TAKE (1) TABLET BY MOUTH EVERY OTHER DAY. Patient taking differently: Take 500 mg by mouth every other day. 01/24/21   Bacigalupo, Angela M, MD  VELPHORO  500 MG chewable tablet Take 1 tablet with meals and snacks 05/12/24   Smucker, Melvenia, MD    Allergies: Shellfish allergy, Diazepam , and Morphine     Review of Systems  Updated Vital Signs BP (!) 124/58   Pulse 75   Temp 97.6 F (36.4 C) (Oral)   Resp 13   Ht 1.651 m (5' 5)   Wt 104 kg   SpO2 100%   BMI 38.15 kg/m   Physical Exam Vitals and nursing note reviewed.  Constitutional:      General: [name] is not in acute distress.    Appearance: [name] is ill-appearing.  HENT:     Right Ear: External ear normal.     Nose: Nose normal.  Eyes:     Pupils: Pupils are equal, round, and reactive to light.  Cardiovascular:     Rate and Rhythm: Normal rate.  Pulmonary:     Effort: Pulmonary effort is normal.     Breath sounds: Normal breath sounds.  Abdominal:     General: Abdomen is flat. Bowel sounds are normal.     Palpations: Abdomen is soft.  Musculoskeletal:        General: Normal range of motion.     Cervical back: Normal range of motion.  Skin:    General: Skin is warm.     Capillary Refill: Capillary refill takes less than 2 seconds.  Neurological:     General: No focal deficit present.  Psychiatric:        Mood and Affect: Mood normal.     (all labs ordered are listed, but only abnormal results are displayed) Labs Reviewed  COMPREHENSIVE METABOLIC PANEL WITH GFR - Abnormal;  Notable for the following components:      Result Value   Potassium 5.5 (*)    Chloride 93 (*)    Glucose, Bld 236 (*)    BUN 40 (*)    Creatinine, Ser 6.98 (*)    Alkaline Phosphatase 270 (*)    GFR, Estimated 6 (*)    Anion gap 19 (*)    All other components within normal limits  CBC WITH DIFFERENTIAL/PLATELET - Abnormal; Notable for the following components:   Hemoglobin 11.1 (*)    MCHC 29.4 (*)  RDW 16.3 (*)    All other components within normal limits  I-STAT CG4 LACTIC ACID, ED - Abnormal; Notable for the following components:   Lactic Acid, Venous 2.9 (*)    All other components within normal limits  I-STAT VENOUS BLOOD GAS, ED - Abnormal; Notable for the following components:   pO2, Ven 141 (*)    Bicarbonate 30.6 (*)    Acid-Base Excess 5.0 (*)    Calcium , Ion 1.04 (*)    All other components within normal limits  TROPONIN T, HIGH SENSITIVITY - Abnormal; Notable for the following components:   Troponin T High Sensitivity 235 (*)    All other components within normal limits  CULTURE, BLOOD (ROUTINE X 2)  CULTURE, BLOOD (ROUTINE X 2)  ETHANOL  CBC WITH DIFFERENTIAL/PLATELET  URINE DRUG SCREEN  HEPATITIS B SURFACE ANTIGEN  HEPATITIS B SURFACE ANTIBODY, QUANTITATIVE  I-STAT CG4 LACTIC ACID, ED  TROPONIN T, HIGH SENSITIVITY    EKG: EKG Interpretation Date/Time:  Saturday July 29 2024 12:40:41 EST Ventricular Rate:  80 PR Interval:  134 QRS Duration:  101 QT Interval:  421 QTC Calculation: 486 R Axis:   63  Text Interpretation: Sinus rhythm Borderline prolonged QT interval No acute changes Confirmed by Levander Houston [phone]) on 07/29/2024 1:59:06 PM  Radiology: CT Head Wo Contrast Result Date: 07/29/2024 EXAM: CT HEAD WITHOUT CONTRAST 07/29/2024 02:23:19 PM TECHNIQUE: CT of the head was performed without the administration of intravenous contrast. Automated exposure control, iterative reconstruction, and/or weight based adjustment of the mA/kV was utilized  to reduce the radiation dose to as low as reasonably achievable. COMPARISON: 10/20/2022 CLINICAL HISTORY: Mental status change, unknown cause FINDINGS: BRAIN AND VENTRICLES: No acute hemorrhage. No evidence of acute infarct. No hydrocephalus. No extra-axial collection. No mass effect or midline shift. Septum pellucidum is present. Atrophy and white matter changes are mildly advanced for age, stable. Atherosclerotic calcifications are present in the cavernous carotid arteries bilaterally. No hyperdense vessel is present. ORBITS: No acute abnormality. SINUSES: No acute abnormality. SOFT TISSUES AND SKULL: No acute soft tissue abnormality. No skull fracture. IMPRESSION: 1. No acute intracranial abnormality. 2. Mildly advanced atrophy and white matter changes for age, stable. Electronically signed by: Lonni Necessary MD 07/29/2024 02:37 PM EST RP Workstation: [id number]   DG Chest Port 1 View Result Date: 07/29/2024 EXAM: 1 VIEW(S) XRAY OF THE CHEST 07/29/2024 01:19:47 PM COMPARISON: 06/21/2024 CLINICAL HISTORY: Altered mental status. FINDINGS: LINES, TUBES AND DEVICES: Vascular stent in left arm. LUNGS AND PLEURA: Low lung volumes. Increased interstitial markings with differential diagnosis including pulmonary edema versus atypical infection. No pleural effusion. No pneumothorax. HEART AND MEDIASTINUM: Atherosclerotic plaque. No acute abnormality of the cardiac and mediastinal silhouettes. BONES AND SOFT TISSUES: No acute osseous abnormality. IMPRESSION: 1. Increased interstitial markings, differential diagnosis includes pulmonary edema versus atypical infection. 2. Low lung volumes. Electronically signed by: Waddell Calk MD 07/29/2024 02:04 PM EST RP Workstation: [id number]     .Critical Care  Performed by: Levander Houston, MD Authorized by: Levander Houston, MD   Critical care provider statement:    Critical care time (minutes):  70   Critical care time was exclusive of:  Separately billable procedures  and treating other patients and teaching time   Critical care was time spent personally by me on the following activities:  Development of treatment plan with patient or surrogate, discussions with consultants, evaluation of patient's response to treatment, examination of patient, ordering and review of laboratory studies, ordering and review of radiographic studies,  ordering and performing treatments and interventions, pulse oximetry, re-evaluation of patient's condition and review of old charts    Medications Ordered in the ED  lactated ringers  infusion (has no administration in time range)  metroNIDAZOLE  (FLAGYL ) IVPB 500 mg (500 mg Intravenous New Bag/Given 07/29/24 1448)  vancomycin  (VANCOREADY) IVPB 2000 mg/400 mL (has no administration in time range)  Chlorhexidine  Gluconate Cloth 2 % PADS 6 each (has no administration in time range)  pentafluoroprop-tetrafluoroeth (GEBAUERS) aerosol 1 Application (has no administration in time range)  lidocaine  (PF) (XYLOCAINE ) 1 % injection 5 mL (has no administration in time range)  lidocaine -prilocaine  (EMLA ) cream 1 Application (has no administration in time range)  heparin  injection 1,000 Units (has no administration in time range)  anticoagulant sodium citrate  solution 5 mL (has no administration in time range)  alteplase  (CATHFLO ACTIVASE ) injection 2 mg (has no administration in time range)  pentafluoroprop-tetrafluoroeth (GEBAUERS) aerosol 1 Application (has no administration in time range)  lidocaine  (PF) (XYLOCAINE ) 1 % injection 5 mL (has no administration in time range)  lidocaine -prilocaine  (EMLA ) cream 1 Application (has no administration in time range)  feeding supplement (NEPRO CARB STEADY) liquid 237 mL (has no administration in time range)  heparin  injection 1,000 Units (has no administration in time range)  anticoagulant sodium citrate  solution 5 mL (has no administration in time range)  alteplase  (CATHFLO ACTIVASE ) injection 2 mg (has  no administration in time range)  heparin  injection 2,500 Units (has no administration in time range)  heparin  injection 1,500 Units (has no administration in time range)  lactated ringers  bolus 500 mL (0 mLs Intravenous Stopped 07/29/24 1335)  naloxone  (NARCAN ) injection 0.4 mg (0.4 mg Intravenous Given 07/29/24 1255)  lactated ringers  bolus 500 mL (0 mLs Intravenous Stopped 07/29/24 1439)  ceFEPIme  (MAXIPIME ) 2 g in sodium chloride  0.9 % 100 mL IVPB (0 g Intravenous Stopped 07/29/24 1434)    Clinical Course as of 07/29/24 1512  Sat Jul 29, 2024  1324 Lactic acid elevated at 2.9  [DR]  1415 Chest x-Monserath Neff reviewed interpreted has shows some increased interstitial markings which could reflect either pulmonary edema or atypical infection and radiologist interpretation concurs [DR]  1423 Patient continues to be somnolent when not receiving any verbal stimuli.  However upon talking to her [name] opens her eyes and responds appropriately to my questions.  [name] states that [name] took Lyrica  and tizanidine .  [name] denies taking any hydrocodone  or patches of medication.  [name] denies any chest pain today. [DR]    Clinical Course User Index [DR] Levander Houston, MD                                 Medical Decision Making Amount and/or Complexity of Data Reviewed Labs: ordered. Radiology: ordered.  Risk Prescription drug management.   69 year old female presents today with acute onset of altered mental status.  Here [name] has decreased mental status but is arousable to verbal stimuli.  Appears confused about the events thinking [name] is at dialysis. No lateralized symptoms are noted. Differential diagnosis is broad and includes but is not limited to stroke, ingestion, other encephalopathies, hypoglycemia, other metabolic abnormalities, infection, cardiac etiologies with low blood pressure noted. Patient is being evaluated here with labs, CT, chest x-Byran Bilotti Plan Narcan  Plan fluid bolus Consult discussed with  nephrology who will see for dialysis Consult to cardiology discussed with Dr. Nancey- aware of elevated troponin with no chest pain or EKG changes.  Awaiting repeat 2 hour trop.  Cardiology will see in consult Plan   Patient with AMS 1- evaluated for sepsis with borderline hypotension, AMS.  Lactic elevated.  Patient given fluid bolus and broad spectrum antibiotics. 2- UDS and etoh pending.  Patient makes some urine.  Denies etoh.  [name] is on oxycodone , lyrica , report of buprenorhine patch Now endorses taking lyrica  and gabapentin  PDMP reviewed hydrocodone  was last filled on 115, Lyrica  filled 1220, gabapentin  925 No buprenorphine  patches in past several months 3 end-stage renal disease with elevated potassium 5.5, BUN 40, creatinine 6.98-patient was due for dialysis today.  Discussed with nephrology and they are aware of patient.  No EKG changes and hyperkalemia is currently mild.  Since that initial potassium, patient has received IV fluids. 4 patient with some increased markings on chest x-Mabrey Howland which could represent some volume overload.  Patient appears to have normal oxygen  saturations and does not appear to currently be in respiratory distress.  [name] will need dialysis. 5-elevated troponin patient does not have any current chest pain.  EKG is unchanged.  Care was discussed with Dr. Nancey who will see in consultation Repeat troponin pending  Care discussed with Dr. Seena who will see for admission     Final diagnoses:  Altered mental status, unspecified altered mental status type  Elevated troponin  ESRD (end stage renal disease) (HCC)  Hyperkalemia    ED Discharge Orders     None          Levander Houston, MD 07/29/24 1512  "

## 2024-07-29 NOTE — ED Notes (Addendum)
 Pt awake and able to communicate with nurse. Confused about how she got to the hospital but she is aware she is in the hospital. A&O x4.

## 2024-07-29 NOTE — Consult Note (Signed)
 [facility] Renal Consultation Note    Indication for Consultation:  Management of ESRD/hemodialysis, anemia, hypertension/volume, and secondary hyperparathyroidism.  HPI: [name] is a 69 y.o. female with PMH including ESRD on dialysis, CAD, CHF, DM, HTN who was sent to the ED from her dialysis center. HD RN reported she fell while on her way in to dialysis. She reported to them that she took her medicine,' but was unable to elaborate. Per notes, patient did not seem altered en route to HD but because altered when she arrived. EMS reported pupils became pinpoint and she only responded to painful stimuli, BP 74/34. On my exam, patient is slow to respond but is A&O x3. She reports she took lyrica  100mg  prior to HD. Reports she also has a prescription for percocet but did not take it today. She reports feeling tired and weak. She is unable to tell when her symptoms started.  Denies fever, chills, HA, dizziness, CP, palpitations, SOB, orthopnea, abdominal pain, N/V/D, dysuria, edema. Labs in the ED notable for K+ 5.5 CO2 25 BUN 40 Cr 6.98 Lactic acid 2.9. Sepsis protocol was initiated in the ED.   Past Medical History:  Diagnosis Date   Anemia    Aortic stenosis    Echo 8/18: mean 13, peak 28, LVOT/AV mean velocity 0.51   Arthritis    Asthma    As a child    Bronchitis    CAD (coronary artery disease)    a. 09/2016: 50% Ost 1st Mrg stenosis, 50% 2nd Mrg stenosis, 20% Mid-Cx, 95% Prox LAD, 40% mid-LAD, and 10% dist-LAD stenosis. Staged PCI with DES to Prox-LAD.    Chronic combined systolic and diastolic CHF (congestive heart failure) (HCC) 2011   echo 2/18: EF 55-60, normal wall motion, grade 2 diastolic dysfunction, trivial AI // echo 3/18: Septal and apical HK, EF 45-50, normal wall motion, trivial AI, mild LAE, PASP 38 // echo 8/18: EF 60-65, normal wall motion, grade 1 diastolic dysfunction, calcified aortic valve leaflets, mild aortic stenosis (mean 13, peak 28, LVOT/AV mean  velocity 0.51), mild AI, moderate MAC, mild LAE, trivial TR    Complication of anesthesia    Depression    Diabetes mellitus Dx 1989   Elevated lipids    ESRD on hemodialysis (HCC)    TTS at [facility]   GERD (gastroesophageal reflux disease)    Gout    Heart murmur    asymptomatic   Hepatitis C Dx 2013   Hypertension Dx 1989   Infected surgical wound    Lt arm   Myocardial infarction (HCC) 07/2015   Obesity    Pancreatitis 2013   Pneumonia    Refusal of blood transfusions as patient is Jehovah's Witness    pt states she is not jehovas witness and does not refuse blood products   Tendinitis    Tremors of nervous system    LEFT HAND   Ulcer 2010   Past Surgical History:  Procedure Laterality Date   A/V FISTULAGRAM Left 04/11/2019   Procedure: A/V FISTULAGRAM;  Surgeon: Jama Cordella MATSU, MD;  Location: [facility];  Service: Cardiovascular;  Laterality: Left;   A/V FISTULAGRAM Left 06/02/2019   Procedure: A/V FISTULAGRAM;  Surgeon: Jama Cordella MATSU, MD;  Location: [facility];  Service: Cardiovascular;  Laterality: Left;   AMPUTATION Left 10/28/2022   Procedure: LEFT FOOT 1ST Romito AMPUTATION;  Surgeon: Harden Jerona GAILS, MD;  Location: [facility] OR;  Service: Orthopedics;  Laterality: Left;  APPLICATION OF WOUND VAC Left 08/14/2017   Procedure: APPLICATION OF WOUND VAC Exchange;  Surgeon: Dessa Reyes ORN, MD;  Location: [facility] ORS;  Service: General;  Laterality: Left;   APPLICATION OF WOUND VAC Left 12/21/2018   Procedure: APPLICATION OF WOUND VAC;  Surgeon: Jama Cordella MATSU, MD;  Location: [facility] OR[facility];  Service: Vascular;  Laterality: Left;   AV FISTULA PLACEMENT Left 08/19/2018   Procedure: ARTERIOVENOUS (AV) FISTULA CREATION ( BRACHIOBASILIC );  Surgeon: Jama Cordella MATSU, MD;  Location: [facility];  Service: Vascular;  Laterality: Left;   BASCILIC VEIN TRANSPOSITION Left 11/18/2018   Procedure: BASCILIC VEIN TRANSPOSITION;  Surgeon: Jama Cordella MATSU, MD;  Location: [facility];   Service: Vascular;  Laterality: Left;   BIOPSY  09/20/2020   Procedure: BIOPSY;  Surgeon: Rollin Dover, MD;  Location: [facility]OSCOPY;  Service: Endoscopy;;   CHOLECYSTECTOMY     COLONOSCOPY WITH PROPOFOL  N/A 02/03/2018   Procedure: COLONOSCOPY WITH PROPOFOL ;  Surgeon: Unk Corinn Skiff, MD;  Location: [facility] ENDOSCOPY;  Service: Gastroenterology;  Laterality: N/A;   COLONOSCOPY WITH PROPOFOL  N/A 07/25/2022   Procedure: COLONOSCOPY WITH PROPOFOL ;  Surgeon: Maryruth Ole DASEN, MD;  Location: [facility] ENDOSCOPY;  Service: Endoscopy;  Laterality: N/A;   CORONARY ANGIOPLASTY  07/2015   STENT   CORONARY STENT INTERVENTION N/A 09/18/2016   Procedure: Coronary Stent Intervention;  Surgeon: Debby DELENA Sor, MD;  Location: [facility];  Service: Cardiovascular;  Laterality: N/A;   DIALYSIS/PERMA CATHETER INSERTION N/A 05/10/2018   Procedure: DIALYSIS/PERMA CATHETER INSERTION;  Surgeon: Jama Cordella MATSU, MD;  Location: [facility];  Service: Cardiovascular;  Laterality: N/A;   DIALYSIS/PERMA CATHETER INSERTION N/A 01/21/2024   Procedure: DIALYSIS/PERMA CATHETER INSERTION;  Surgeon: Lanis Fonda BRAVO, MD;  Location: [facility];  Service: Cardiovascular;  Laterality: N/A;   DRESSING CHANGE UNDER ANESTHESIA Left 08/15/2017   Procedure: exploration of wound for bleeding;  Surgeon: Dessa Reyes ORN, MD;  Location: [facility];  Service: General;  Laterality: Left;   ESOPHAGOGASTRODUODENOSCOPY (EGD) WITH PROPOFOL  N/A 02/03/2018   Procedure: ESOPHAGOGASTRODUODENOSCOPY (EGD) WITH PROPOFOL ;  Surgeon: Unk Corinn Skiff, MD;  Location: [facility] ENDOSCOPY;  Service: Gastroenterology;  Laterality: N/A;   ESOPHAGOGASTRODUODENOSCOPY (EGD) WITH PROPOFOL  N/A 09/20/2020   Procedure: ESOPHAGOGASTRODUODENOSCOPY (EGD) WITH PROPOFOL ;  Surgeon: Rollin Dover, MD;  Location: [facility] ENDOSCOPY;  Service: Endoscopy;  Laterality: N/A;   ESOPHAGOGASTRODUODENOSCOPY (EGD) WITH PROPOFOL  N/A 07/25/2022   Procedure: ESOPHAGOGASTRODUODENOSCOPY  (EGD) WITH PROPOFOL ;  Surgeon: Maryruth Ole DASEN, MD;  Location: [facility] ENDOSCOPY;  Service: Endoscopy;  Laterality: N/A;   EYE SURGERY  11/17/2018   INCISION AND DRAINAGE ABSCESS Left 08/12/2017   Procedure: INCISION AND DRAINAGE ABSCESS;  Surgeon: Dessa Reyes ORN, MD;  Location: [facility] ORS;  Service: General;  Laterality: Left;   KNEE ARTHROSCOPY     LEFT HEART CATH N/A 09/18/2016   Procedure: Left Heart Cath;  Surgeon: Debby DELENA Sor, MD;  Location: [facility];  Service: Cardiovascular;  Laterality: N/A;   LEFT HEART CATH AND CORONARY ANGIOGRAPHY N/A 09/16/2016   Procedure: Left Heart Cath and Coronary Angiography;  Surgeon: Lonni JONETTA Cash, MD;  Location: [facility] INVASIVE CV LAB;  Service: Cardiovascular;  Laterality: N/A;   LEFT HEART CATH AND CORONARY ANGIOGRAPHY N/A 04/29/2017   Procedure: LEFT HEART CATH AND CORONARY ANGIOGRAPHY;  Surgeon: Mady Lonni, MD;  Location: [facility];  Service: Cardiovascular;  Laterality: N/A;   LOWER EXTREMITY ANGIOGRAPHY Right 03/08/2018   Procedure: LOWER EXTREMITY ANGIOGRAPHY;  Surgeon: Jama Cordella MATSU, MD;  Location: [facility];  Service: Cardiovascular;  Laterality: Right;   PERIPHERAL VASCULAR THROMBECTOMY  01/21/2024   Procedure: PERIPHERAL VASCULAR THROMBECTOMY;  Surgeon: Lanis Fonda BRAVO, MD;  Location: [facility];  Service: Cardiovascular;;   TUBAL LIGATION     TUBAL LIGATION     UPPER EXTREMITY ANGIOGRAPHY Right 09/19/2019   Procedure: UPPER EXTREMITY ANGIOGRAPHY;  Surgeon: Jama Cordella MATSU, MD;  Location: [facility];  Service: Cardiovascular;  Laterality: Right;   VENOUS STENT  01/21/2024   Procedure: VENOUS STENT;  Surgeon: Lanis Fonda BRAVO, MD;  Location: [facility];  Service: Cardiovascular;;   WOUND DEBRIDEMENT Left 12/21/2018   Procedure: DEBRIDEMENT WOUND;  Surgeon: Jama Cordella MATSU, MD;  Location: [facility];  Service: Vascular;  Laterality: Left;   WOUND DEBRIDEMENT Left 12/30/2018   Procedure:  DEBRIDEMENT WOUND WITH VAC PLACEMENT (LEFT UPPER EXTREMITY);  Surgeon: Jama Cordella MATSU, MD;  Location: [facility];  Service: Vascular;  Laterality: Left;   Family History  Problem Relation Age of Onset   Colon cancer Mother    Heart attack Other    Heart attack Maternal Grandmother    Hypertension Sister    Hypertension Brother    Diabetes Paternal Grandmother    Breast cancer Neg Hx    Social History:  reports that she quit smoking about 43 years ago. Her smoking use included cigarettes. She started smoking about 49 years ago. She has a 1.5 pack-year smoking history. She has never used smokeless tobacco. She reports that she does not currently use alcohol. She reports that she does not currently use drugs after having used the following drugs: Marijuana.  ROS: As per HPI otherwise negative.  Physical Exam: Vitals:   07/29/24 1236 07/29/24 1240 07/29/24 1245  BP:   (!) 94/39  Resp:   13  Temp:  97.6 F (36.4 C)   TempSrc:  Oral   SpO2: 98%    Weight:  104 kg   Height:  5' 5 (1.651 m)      General: Well developed, well nourished, lethargic female Head: Normocephalic, atraumatic, sclera non-icteric, mucus membranes are moist. Lungs: Clear bilaterally to auscultation without wheezes, rales, or rhonchi. Breathing is unlabored. Heart: RRR with normal S1, S2. No murmurs, rubs, or gallops appreciated. Abdomen: Soft, non-tender, non-distended with normoactive bowel sounds.  Musculoskeletal:  Strength and tone appear normal for age. Lower extremities: trace edema b/l lower extremities Neuro: Awakens to voice, slow to respond but then Alert and oriented X 3.  Dialysis Access: AVF + t/b  Allergies[1] Prior to Admission medications  Medication Sig Start Date End Date Taking? Authorizing Provider  albuterol  (VENTOLIN  HFA) 108 (90 Base) MCG/ACT inhaler Inhale 2 puffs into the lungs every 4 (four) hours as needed for wheezing or shortness of breath. 06/28/20   Vivienne Delon HERO, PA-C   aspirin  81 MG EC tablet Take 1 tablet (81 mg total) by mouth daily. 09/20/20   Singh, Prashant K, MD  atorvastatin  (LIPITOR ) 80 MG tablet Take 1 tablet (80 mg total) by mouth every evening. Patient taking differently: Take 80 mg by mouth at bedtime. 04/24/21   Bacigalupo, Angela M, MD  cinacalcet  (SENSIPAR ) 30 MG tablet Take 30 mg by mouth daily. Patient not taking: Reported on 06/01/2024 04/11/24   [provider]  diclofenac  Sodium (VOLTAREN ) 1 % GEL Apply 2 g topically in the morning, at noon, and at bedtime. Left and right knee    [provider]  fluticasone  (FLONASE ) 50 MCG/ACT nasal  spray Place 2 sprays into both nostrils daily as needed for allergies or rhinitis. 09/28/17   Vivienne Delon HERO, PA-C  fluticasone  furoate-vilanterol (BREO ELLIPTA ) 200-25 MCG/INH AEPB Inhale 1 puff into the lungs daily. 08/30/20   Burnette, Jennifer M, PA-C  HYDROcodone -acetaminophen  (NORCO/VICODIN) 5-325 MG tablet Take 1 tablet by mouth every 6 (six) hours as needed for up to 12 doses. 07/11/24   Neysa Thersia RAMAN, PA-C  hydrOXYzine  (ATARAX ) 25 MG tablet Take 1 tablet (25 mg total) by mouth 2 (two) times daily. 11/17/22   Hongalgi, Anand D, MD  lidocaine  (LIDODERM ) 5 % Place 1 patch onto the skin daily. Remove & Discard patch within 12 hours or as directed by MD Patient not taking: Reported on 06/01/2024 06/25/23   Palumbo, April, MD  midodrine  (PROAMATINE ) 10 MG tablet Take 1 tablet (10 mg total) by mouth 3 (three) times daily with meals. 06/02/24   Harrie Bruckner, DO  naloxone  (NARCAN ) nasal spray 4 mg/0.1 mL Place 1 spray into the nose daily as needed (for overdose). 04/08/22   [provider]  nitroGLYCERIN  (NITROSTAT ) 0.4 MG SL tablet Place 1 tablet (0.4 mg total) under the tongue every 5 (five) minutes x 3 doses as needed for chest pain. Patient not taking: Reported on 06/01/2024 03/08/24   Howell Lunger, DO  Oxycodone  HCl 10 MG TABS Take 1 tablet (10 mg total) by mouth 3  (three) times daily. 05/12/24   Smucker, Melvenia, MD  OZEMPIC, 2 MG/DOSE, 8 MG/3ML SOPN Inject 2 mg into the skin once a week.    [provider]  pantoprazole  (PROTONIX ) 40 MG tablet Take 40 mg by mouth daily before breakfast.    [provider]  polyethylene glycol powder (GLYCOLAX /MIRALAX ) 17 GM/SCOOP powder Dissolve 1 capful (17g) in 4-8 ounces of liquid and take by mouth daily as needed for mild constipation 03/30/24   Gomes, Adriana, DO  pregabalin  (LYRICA ) 100 MG capsule Take 1 capsule (100 mg total) by mouth 2 (two) times daily. 05/12/24   Smucker, Melvenia, MD  senna (SENOKOT) 8.6 MG TABS tablet Take 1 tablet (8.6 mg total) by mouth 2 (two) times daily. 03/30/24   Gomes, Adriana, DO  sertraline  (ZOLOFT ) 25 MG tablet Take 25 mg by mouth daily. 05/22/24   [provider]  torsemide  (DEMADEX ) 100 MG tablet Take 1 tablet (100mg ) on NON-dialysis days (Sunday, Monday, Wednesday, Friday) 03/08/24   Everhart, Kirstie, DO  valACYclovir  (VALTREX ) 500 MG tablet TAKE (1) TABLET BY MOUTH EVERY OTHER DAY. Patient taking differently: Take 500 mg by mouth every other day. 01/24/21   Bacigalupo, Angela M, MD  VELPHORO  500 MG chewable tablet Take 1 tablet with meals and snacks 05/12/24   Smucker, Melvenia, MD   Current Facility-Administered Medications  Medication Dose Route Frequency Provider Last Rate Last Admin   ceFEPIme  (MAXIPIME ) 2 g in sodium chloride  0.9 % 100 mL IVPB  2 g Intravenous Once Levander Houston, MD       lactated ringers  bolus 500 mL  500 mL Intravenous Once Levander Houston, MD 500 mL/hr at 07/29/24 1339 500 mL at 07/29/24 1339   lactated ringers  infusion   Intravenous Continuous Darrington, Houston, MD       metroNIDAZOLE  (FLAGYL ) IVPB 500 mg  500 mg Intravenous Once Levander Houston, MD       vancomycin  (VANCOREADY) IVPB 2000 mg/400 mL  2,000 mg Intravenous Once Levander Houston, MD       Current Outpatient Medications  Medication Sig Dispense Refill   albuterol  (VENTOLIN   HFA) 108 (90 Base) MCG/ACT inhaler Inhale 2 puffs into the lungs every 4 (four) hours as needed for wheezing or shortness of breath. 18 g 5   aspirin  81 MG EC tablet Take 1 tablet (81 mg total) by mouth daily. 30 tablet 12   atorvastatin  (LIPITOR ) 80 MG tablet Take 1 tablet (80 mg total) by mouth every evening. (Patient taking differently: Take 80 mg by mouth at bedtime.) 90 tablet 0   cinacalcet  (SENSIPAR ) 30 MG tablet Take 30 mg by mouth daily. (Patient not taking: Reported on 06/01/2024)     diclofenac  Sodium (VOLTAREN ) 1 % GEL Apply 2 g topically in the morning, at noon, and at bedtime. Left and right knee     fluticasone  (FLONASE ) 50 MCG/ACT nasal spray Place 2 sprays into both nostrils daily as needed for allergies or rhinitis. 48 g 1   fluticasone  furoate-vilanterol (BREO ELLIPTA ) 200-25 MCG/INH AEPB Inhale 1 puff into the lungs daily. 60 each 0   HYDROcodone -acetaminophen  (NORCO/VICODIN) 5-325 MG tablet Take 1 tablet by mouth every 6 (six) hours as needed for up to 12 doses. 12 tablet 0   hydrOXYzine  (ATARAX ) 25 MG tablet Take 1 tablet (25 mg total) by mouth 2 (two) times daily.     lidocaine  (LIDODERM ) 5 % Place 1 patch onto the skin daily. Remove & Discard patch within 12 hours or as directed by MD (Patient not taking: Reported on 06/01/2024) 30 patch 0   midodrine  (PROAMATINE ) 10 MG tablet Take 1 tablet (10 mg total) by mouth 3 (three) times daily with meals. 90 tablet 0   naloxone  (NARCAN ) nasal spray 4 mg/0.1 mL Place 1 spray into the nose daily as needed (for overdose).     nitroGLYCERIN  (NITROSTAT ) 0.4 MG SL tablet Place 1 tablet (0.4 mg total) under the tongue every 5 (five) minutes x 3 doses as needed for chest pain. (Patient not taking: Reported on 06/01/2024) 25 tablet 0   Oxycodone  HCl 10 MG TABS Take 1 tablet (10 mg total) by mouth 3 (three) times daily. 90 tablet 0   OZEMPIC, 2 MG/DOSE, 8 MG/3ML SOPN Inject 2 mg into the skin once a week.     pantoprazole  (PROTONIX ) 40 MG tablet  Take 40 mg by mouth daily before breakfast.     polyethylene glycol powder (GLYCOLAX /MIRALAX ) 17 GM/SCOOP powder Dissolve 1 capful (17g) in 4-8 ounces of liquid and take by mouth daily as needed for mild constipation 238 g 0   pregabalin  (LYRICA ) 100 MG capsule Take 1 capsule (100 mg total) by mouth 2 (two) times daily. 60 capsule 0   senna (SENOKOT) 8.6 MG TABS tablet Take 1 tablet (8.6 mg total) by mouth 2 (two) times daily. 60 tablet 0   sertraline  (ZOLOFT ) 25 MG tablet Take 25 mg by mouth daily.     torsemide  (DEMADEX ) 100 MG tablet Take 1 tablet (100mg ) on NON-dialysis days (Sunday, Monday, Wednesday, Friday) 30 tablet 0   valACYclovir  (VALTREX ) 500 MG tablet TAKE (1) TABLET BY MOUTH EVERY OTHER DAY. (Patient taking differently: Take 500 mg by mouth every other day.) 45 tablet 1   VELPHORO  500 MG chewable tablet Take 1 tablet with meals and snacks     Labs: Basic Metabolic Panel: Recent Labs  Lab 07/29/24 1250 07/29/24 1320  NA 136 135  K 5.5* 5.1  CL 93*  --   CO2 25  --   GLUCOSE 236*  --   BUN 40*  --   CREATININE 6.98*  --  CALCIUM  10.1  --    Liver Function Tests: Recent Labs  Lab 07/29/24 1250  AST 26  ALT 27  ALKPHOS 270*  BILITOT 0.3  PROT 6.9  ALBUMIN  4.1    CBC: Recent Labs  Lab 07/29/24 1320  HGB 12.2  HCT 36.0    Dialysis Orders:  Center: [facility]  on TTS . 180NRe Time: 4 hr 30 min BFR 450 DFR A 1.5 EDW 102.5kg 2K 2.5Ca AVF 15g Heparin  3000 unit bolus  Mircera 225mcg IV q 2 weeks Hectorol  8mcg IV q HD Sensipar  30mg  PO q HD  Assessment/Plan:  AMS: Reportedly began abruptly upon arrival to HD. Work up ongoing per primary team. Does not appear to be uremic based on labs. Does report taking lyrica  pre HD but says she has been on it for years and did not take any extra doses.   ESRD:  On TTS schedule. Will plan for HD today  Hypertension/volume: BP soft. Trace edema on exam, 1.5kg over her EDW. UF goal 1-1.5L as BP allows  Anemia: Hgb 12.2. No  ESA indicated  Metabolic bone disease: Calcium  slightly elevated, no VDRA for now. Follow phos  Nutrition:  Currently NPO  [name], PA-C 07/29/2024, 2:04 PM  [facility] Pager: [phone]     [1]  Allergies Allergen Reactions   Shellfish Allergy Anaphylaxis and Swelling   Diazepam  Other (See Comments)    felt like out of body experience   Morphine  Itching and Other (See Comments)    Tolerated hydromorphone  on 07/2020 *will take along with Benadryl *

## 2024-07-29 NOTE — Progress Notes (Signed)
 MB arrived to ED 17 to find that Pt just left OTF to Dialysis for the next 3-4 hrs per nurse. I contacted the ordering Dr. Seena, A to inform him off this and the fact that Pt EEG will be delayed due to No Staff on 3rd shift tonight. Pt AMS will possible be re-accessed tomorrow after dialysis to determine if EEG is still needed. Dr. Vevelyn to cancel order at this time and hold off.

## 2024-07-29 NOTE — ED Notes (Signed)
 Pt is not willing to allow nurse to catheterize her. Pt state she is unable to make urine at this time and does not want to be catheterized due to it hurting her from a previous event.

## 2024-07-29 NOTE — Progress Notes (Signed)
 Elink following for sepsis protocol.

## 2024-07-29 NOTE — Consult Note (Signed)
 "  Cardiology Consultation  Patient ID: [name] MRN: [id number]; DOB: 1955-08-01  Admit date: 07/29/2024 Date of Consult: 07/29/2024  PCP:  [facility] [address]   [facility] Providers Cardiologist:  None     Patient Profile: [name] is a 69 y.o. female with a hx of ESRD on HD (Tue/Thur/Sat schedule), chronic anemia, CAD s/p PCI in 2018, moderate to severe aortic stenosis, chronic HFpEF, osteoarthritis of bilateral knees, chronic pain on opioids, asthma, OSA, type 2 diabetes, diabetic neuropathy, who is being seen 07/29/2024 for the evaluation of elevated troponin levels at the request of Dr. Dade.  History of Present Illness: [name] has past medical history as stated above.  She presented to the [facility] emergency department via EMS on 07/29/2024 with reports of altered mental status.  Per report, patient was going to dialysis with her daughter in the morning, seemed fine when she was getting into the car, as they arrive to the dialysis center, patient became weak and slumped out of the car with decreased responsiveness.  Upon arrival, EMS noted that her blood sugar was 199, they did note decreased responsiveness but they were able to get her to respond to verbal stimuli, no signs of lateralized weakness noted.  EMS then reported pupils became pinpoint, patient minimally responded to painful stimuli, BP 74/34.  She reports that she typically takes 100 mg of Lyrica  prior to dialysis, she also has a prescription for Percocet but did not take it this morning.  She reports feeling fatigued, generally weak, denies any fevers, chills, headache, dizziness, chest pain, palpitations, shortness of breath, N/V/D, edema.  Relevant workup while in the ED includes: Lactic acid 2.9, troponin 235 in the setting of ESRD, metabolic panel shows chronic hyperkalemia at 5.5, creatinine consistent with ESRD, chronically elevated alk phos, CBC pending.  CXR showed increased interstitial markings  pulmonary edema vs infection.  CT head ordered, pending results.  EKG showed: Sinus rhythm, HR 80, no significant changes when compared to prior tracings.   Echocardiogram from August 2025: LVEF 65 to 70%, no RWMA, mild LVH, G1 DD, normal RV systolic function, moderately dilated LA, moderate mitral stenosis with mean valve gradient 8 mmHg, moderate to severe MAC, mild aortic regurgitation, moderate to severe aortic stenosis aortic valve area 1.03 cm, mean gradient 26 mmHg, peak gradient 43.8 mmHg, DI 0.36, dilated IVC.  Although patient has cardiac history, I cannot see any notes indicating where they get their cardiology care from.  Upon chart review, patient was seen in November 2025 for altered mental status and missed dialysis sessions.  During this admission, it was suspected that her lethargy/weakness were in the setting of polypharmacy with possible role of hypercarbia in the setting of OSA, restrictive lung disease.  She was admitted for 1 day, discharged 06/02/2024.  During this time she was told to hold amlodipine , carvedilol , gabapentin , Zanaflex .  She was taking oxycodone , gabapentin , pregabalin , Zanaflex .  Therefore she was just taking oxycodone  and pregabalin  on discharge.  She was noted to be hypotensive, thus why amlodipine  and carvedilol  were discontinued during this admission.  She was continued on torsemide  100 mg on nondialysis days including Sunday, Monday, Wednesday, Friday.  She was also given midodrine  10 mg TID for BP support.  Patient is seen and examined at the bedside.  Very slow to respond.  She does not recall if she had syncope.  Per chart review, she was trying to get out of the car at her dialysis center  this morning when she was noted to be less responsive.  She reported that she was able to hear people talking but cannot recall if she lost consciousness around the time.  DOE improved in the last few months.  She has sharp chest pains chronically.  This last for few  seconds.  Past Medical History:  Diagnosis Date   Acute diastolic CHF (congestive heart failure) (HCC)    Acute encephalopathy 10/20/2022   Acute metabolic encephalopathy 06/02/2017   Acute on chronic diastolic CHF (congestive heart failure) (HCC) 10/03/2017   Acute pulmonary edema (HCC) 09/14/2020   Acute respiratory failure with hypoxia (HCC) 09/13/2020   Anemia    Aortic stenosis    Echo 8/18: mean 13, peak 28, LVOT/AV mean velocity 0.51   Arthritis    Asthma    As a child    Bronchitis    CAD (coronary artery disease)    a. 09/2016: 50% Ost 1st Mrg stenosis, 50% 2nd Mrg stenosis, 20% Mid-Cx, 95% Prox LAD, 40% mid-LAD, and 10% dist-LAD stenosis. Staged PCI with DES to Prox-LAD.    Chronic combined systolic and diastolic CHF (congestive heart failure) (HCC) 2011   echo 2/18: EF 55-60, normal wall motion, grade 2 diastolic dysfunction, trivial AI // echo 3/18: Septal and apical HK, EF 45-50, normal wall motion, trivial AI, mild LAE, PASP 38 // echo 8/18: EF 60-65, normal wall motion, grade 1 diastolic dysfunction, calcified aortic valve leaflets, mild aortic stenosis (mean 13, peak 28, LVOT/AV mean velocity 0.51), mild AI, moderate MAC, mild LAE, trivial TR    Complication of anesthesia    Depression    Diabetes mellitus Dx 1989   Elevated lipids    ESRD on hemodialysis (HCC)    TTS at [facility]   GERD (gastroesophageal reflux disease)    Gout    Heart murmur    asymptomatic   Hepatitis C Dx 2013   Hypertension Dx 1989   Infected surgical wound    Lt arm   Myocardial infarction (HCC) 07/2015   Obesity    Pancreatitis 2013   Pneumonia    Refusal of blood transfusions as patient is Jehovah's Witness    pt states she is not jehovas witness and does not refuse blood products   Tendinitis    Tremors of nervous system    LEFT HAND   Ulcer 2010   Past Surgical History:  Procedure Laterality Date   A/V FISTULAGRAM Left 04/11/2019   Procedure: A/V FISTULAGRAM;  Surgeon: Jama Cordella MATSU, MD;  Location: [facility];  Service: Cardiovascular;  Laterality: Left;   A/V FISTULAGRAM Left 06/02/2019   Procedure: A/V FISTULAGRAM;  Surgeon: Jama Cordella MATSU, MD;  Location: [facility];  Service: Cardiovascular;  Laterality: Left;   AMPUTATION Left 10/28/2022   Procedure: LEFT FOOT 1ST Armond AMPUTATION;  Surgeon: Harden Jerona GAILS, MD;  Location: [facility] OR;  Service: Orthopedics;  Laterality: Left;   APPLICATION OF WOUND VAC Left 08/14/2017   Procedure: APPLICATION OF WOUND VAC Exchange;  Surgeon: Dessa Reyes ORN, MD;  Location: [facility] ORS;  Service: General;  Laterality: Left;   APPLICATION OF WOUND VAC Left 12/21/2018   Procedure: APPLICATION OF WOUND VAC;  Surgeon: Jama Cordella MATSU, MD;  Location: [facility] ORS;  Service: Vascular;  Laterality: Left;   AV FISTULA PLACEMENT Left 08/19/2018   Procedure: ARTERIOVENOUS (AV) FISTULA CREATION ( BRACHIOBASILIC );  Surgeon: Jama Cordella MATSU, MD;  Location: [facility] ORS;  Service: Vascular;  Laterality: Left;  BASCILIC VEIN TRANSPOSITION Left 11/18/2018   Procedure: BASCILIC VEIN TRANSPOSITION;  Surgeon: Jama Cordella MATSU, MD;  Location: [facility] ORS;  Service: Vascular;  Laterality: Left;   BIOPSY  09/20/2020   Procedure: BIOPSY;  Surgeon: Rollin Dover, MD;  Location: [facility] ENDOSCOPY;  Service: Endoscopy;;   CHOLECYSTECTOMY     COLONOSCOPY WITH PROPOFOL  N/A 02/03/2018   Procedure: COLONOSCOPY WITH PROPOFOL ;  Surgeon: Unk Corinn Skiff, MD;  Location: [facility] ENDOSCOPY;  Service: Gastroenterology;  Laterality: N/A;   COLONOSCOPY WITH PROPOFOL  N/A 07/25/2022   Procedure: COLONOSCOPY WITH PROPOFOL ;  Surgeon: Maryruth Ole DASEN, MD;  Location: [facility] ENDOSCOPY;  Service: Endoscopy;  Laterality: N/A;   CORONARY ANGIOPLASTY  07/2015   STENT   CORONARY STENT INTERVENTION N/A 09/18/2016   Procedure: Coronary Stent Intervention;  Surgeon: Debby DELENA Sor, MD;  Location: [facility];  Service: Cardiovascular;  Laterality: N/A;   DIALYSIS/PERMA  CATHETER INSERTION N/A 05/10/2018   Procedure: DIALYSIS/PERMA CATHETER INSERTION;  Surgeon: Jama Cordella MATSU, MD;  Location: [facility];  Service: Cardiovascular;  Laterality: N/A;   DIALYSIS/PERMA CATHETER INSERTION N/A 01/21/2024   Procedure: DIALYSIS/PERMA CATHETER INSERTION;  Surgeon: Lanis Fonda BRAVO, MD;  Location: [facility];  Service: Cardiovascular;  Laterality: N/A;   DRESSING CHANGE UNDER ANESTHESIA Left 08/15/2017   Procedure: exploration of wound for bleeding;  Surgeon: Dessa Reyes ORN, MD;  Location: [facility] ORS;  Service: General;  Laterality: Left;   ESOPHAGOGASTRODUODENOSCOPY (EGD) WITH PROPOFOL  N/A 02/03/2018   Procedure: ESOPHAGOGASTRODUODENOSCOPY (EGD) WITH PROPOFOL ;  Surgeon: Unk Corinn Skiff, MD;  Location: [facility] ENDOSCOPY;  Service: Gastroenterology;  Laterality: N/A;   ESOPHAGOGASTRODUODENOSCOPY (EGD) WITH PROPOFOL  N/A 09/20/2020   Procedure: ESOPHAGOGASTRODUODENOSCOPY (EGD) WITH PROPOFOL ;  Surgeon: Rollin Dover, MD;  Location: [facility] ENDOSCOPY;  Service: Endoscopy;  Laterality: N/A;   ESOPHAGOGASTRODUODENOSCOPY (EGD) WITH PROPOFOL  N/A 07/25/2022   Procedure: ESOPHAGOGASTRODUODENOSCOPY (EGD) WITH PROPOFOL ;  Surgeon: Maryruth Ole DASEN, MD;  Location: [facility] ENDOSCOPY;  Service: Endoscopy;  Laterality: N/A;   EYE SURGERY  11/17/2018   INCISION AND DRAINAGE ABSCESS Left 08/12/2017   Procedure: INCISION AND DRAINAGE ABSCESS;  Surgeon: Dessa Reyes ORN, MD;  Location: [facility] ORS;  Service: General;  Laterality: Left;   KNEE ARTHROSCOPY     LEFT HEART CATH N/A 09/18/2016   Procedure: Left Heart Cath;  Surgeon: Debby DELENA Sor, MD;  Location: [facility];  Service: Cardiovascular;  Laterality: N/A;   LEFT HEART CATH AND CORONARY ANGIOGRAPHY N/A 09/16/2016   Procedure: Left Heart Cath and Coronary Angiography;  Surgeon: Lonni JONETTA Cash, MD;  Location: [facility] INVASIVE CV LAB;  Service: Cardiovascular;  Laterality: N/A;   LEFT HEART CATH AND CORONARY ANGIOGRAPHY N/A  04/29/2017   Procedure: LEFT HEART CATH AND CORONARY ANGIOGRAPHY;  Surgeon: Mady Lonni, MD;  Location: [facility];  Service: Cardiovascular;  Laterality: N/A;   LOWER EXTREMITY ANGIOGRAPHY Right 03/08/2018   Procedure: LOWER EXTREMITY ANGIOGRAPHY;  Surgeon: Jama Cordella MATSU, MD;  Location: [facility];  Service: Cardiovascular;  Laterality: Right;   PERIPHERAL VASCULAR THROMBECTOMY  01/21/2024   Procedure: PERIPHERAL VASCULAR THROMBECTOMY;  Surgeon: Lanis Fonda BRAVO, MD;  Location: [facility];  Service: Cardiovascular;;   TUBAL LIGATION     TUBAL LIGATION     UPPER EXTREMITY ANGIOGRAPHY Right 09/19/2019   Procedure: UPPER EXTREMITY ANGIOGRAPHY;  Surgeon: Jama Cordella MATSU, MD;  Location: [facility];  Service: Cardiovascular;  Laterality: Right;   VENOUS STENT  01/21/2024   Procedure: VENOUS STENT;  Surgeon: Lanis,  Fonda BRAVO, MD;  Location: [facility];  Service: Cardiovascular;;   WOUND DEBRIDEMENT Left 12/21/2018   Procedure: DEBRIDEMENT WOUND;  Surgeon: Jama Cordella MATSU, MD;  Location: [facility] ORS;  Service: Vascular;  Laterality: Left;   WOUND DEBRIDEMENT Left 12/30/2018   Procedure: DEBRIDEMENT WOUND WITH VAC PLACEMENT (LEFT UPPER EXTREMITY);  Surgeon: Jama Cordella MATSU, MD;  Location: [facility] OR[facility];  Service: Vascular;  Laterality: Left;    Home Medications:  Prior to Admission medications  Medication Sig Start Date End Date Taking? Authorizing Provider  albuterol  (VENTOLIN  HFA) 108 (90 Base) MCG/ACT inhaler Inhale 2 puffs into the lungs every 4 (four) hours as needed for wheezing or shortness of breath. 06/28/20   Vivienne Delon HERO, PA-C  aspirin  81 MG EC tablet Take 1 tablet (81 mg total) by mouth daily. 09/20/20   Singh, Prashant K, MD  atorvastatin  (LIPITOR ) 80 MG tablet Take 1 tablet (80 mg total) by mouth every evening. Patient taking differently: Take 80 mg by mouth at bedtime. 04/24/21   Bacigalupo, Angela M, MD  cinacalcet  (SENSIPAR ) 30 MG tablet Take 30  mg by mouth daily. Patient not taking: Reported on 06/01/2024 04/11/24   [provider]  diclofenac  Sodium (VOLTAREN ) 1 % GEL Apply 2 g topically in the morning, at noon, and at bedtime. Left and right knee    [provider]  fluticasone  (FLONASE ) 50 MCG/ACT nasal spray Place 2 sprays into both nostrils daily as needed for allergies or rhinitis. 09/28/17   Vivienne Delon HERO, PA-C  fluticasone  furoate-vilanterol (BREO ELLIPTA ) 200-25 MCG/INH AEPB Inhale 1 puff into the lungs daily. 08/30/20   Burnette, Jennifer M, PA-C  HYDROcodone -acetaminophen  (NORCO/VICODIN) 5-325 MG tablet Take 1 tablet by mouth every 6 (six) hours as needed for up to 12 doses. 07/11/24   Neysa Thersia RAMAN, PA-C  hydrOXYzine  (ATARAX ) 25 MG tablet Take 1 tablet (25 mg total) by mouth 2 (two) times daily. 11/17/22   Hongalgi, Anand D, MD  lidocaine  (LIDODERM ) 5 % Place 1 patch onto the skin daily. Remove & Discard patch within 12 hours or as directed by MD Patient not taking: Reported on 06/01/2024 06/25/23   Palumbo, April, MD  midodrine  (PROAMATINE ) 10 MG tablet Take 1 tablet (10 mg total) by mouth 3 (three) times daily with meals. 06/02/24   Harrie Bruckner, DO  naloxone  (NARCAN ) nasal spray 4 mg/0.1 mL Place 1 spray into the nose daily as needed (for overdose). 04/08/22   [provider]  nitroGLYCERIN  (NITROSTAT ) 0.4 MG SL tablet Place 1 tablet (0.4 mg total) under the tongue every 5 (five) minutes x 3 doses as needed for chest pain. Patient not taking: Reported on 06/01/2024 03/08/24   Howell Lunger, DO  Oxycodone  HCl 10 MG TABS Take 1 tablet (10 mg total) by mouth 3 (three) times daily. 05/12/24   Smucker, Melvenia, MD  OZEMPIC, 2 MG/DOSE, 8 MG/3ML SOPN Inject 2 mg into the skin once a week.    [provider]  pantoprazole  (PROTONIX ) 40 MG tablet Take 40 mg by mouth daily before breakfast.    [provider]  polyethylene glycol powder (GLYCOLAX /MIRALAX ) 17 GM/SCOOP powder  Dissolve 1 capful (17g) in 4-8 ounces of liquid and take by mouth daily as needed for mild constipation 03/30/24   Gomes, Adriana, DO  pregabalin  (LYRICA ) 100 MG capsule Take 1 capsule (100 mg total) by mouth 2 (two) times daily. 05/12/24   Smucker, Melvenia, MD  senna (SENOKOT) 8.6 MG TABS tablet Take 1 tablet (8.6 mg  total) by mouth 2 (two) times daily. 03/30/24   Gomes, Adriana, DO  sertraline  (ZOLOFT ) 25 MG tablet Take 25 mg by mouth daily. 05/22/24   [provider]  torsemide  (DEMADEX ) 100 MG tablet Take 1 tablet (100mg ) on NON-dialysis days (Sunday, Monday, Wednesday, Friday) 03/08/24   Everhart, Kirstie, DO  valACYclovir  (VALTREX ) 500 MG tablet TAKE (1) TABLET BY MOUTH EVERY OTHER DAY. Patient taking differently: Take 500 mg by mouth every other day. 01/24/21   Bacigalupo, Angela M, MD  VELPHORO  500 MG chewable tablet Take 1 tablet with meals and snacks 05/12/24   Napoleon Limes, MD    Scheduled Meds:  [START ON 07/30/2024] Chlorhexidine  Gluconate Cloth  6 each Topical Q0600   heparin   2,500 Units Dialysis Once in dialysis   Continuous Infusions:  anticoagulant sodium citrate      anticoagulant sodium citrate      lactated ringers      metronidazole  500 mg (07/29/24 1448)   vancomycin      PRN Meds: alteplase , alteplase , anticoagulant sodium citrate , anticoagulant sodium citrate , feeding supplement (NEPRO CARB STEADY), heparin , heparin , heparin , lidocaine  (PF), lidocaine  (PF), lidocaine -prilocaine , lidocaine -prilocaine , pentafluoroprop-tetrafluoroeth, pentafluoroprop-tetrafluoroeth Allergies:   Allergies[1]  Social History:   Social History   Socioeconomic History   Marital status: Divorced    Spouse name: Not on file   Number of children: 3   Years of education: Not on file   Highest education level: Bachelor's degree (e.g., BA, AB, BS)  Occupational History   Occupation: disability  Tobacco Use   Smoking status: Former    Current packs/day: 0.00    Average  packs/day: 0.3 packs/day for 6.0 years (1.5 ttl pk-yrs)    Types: Cigarettes    Start date: 10/26/1974    Quit date: 10/25/1980    Years since quitting: 43.7   Smokeless tobacco: Never  Vaping Use   Vaping status: Never Used  Substance and Sexual Activity   Alcohol use: Not Currently    Comment: rare   Drug use: Not Currently    Types: Marijuana   Sexual activity: Not on file    Comment: Not asked  Other Topics Concern   Not on file  Social History Narrative   Not on file   Social Drivers of Health   Tobacco Use: Medium Risk (06/21/2024)   Patient History    Smoking Tobacco Use: Former    Smokeless Tobacco Use: Never    Passive Exposure: Not on file  Financial Resource Strain: Low Risk (12/01/2023)   Received from [facility]   Overall Financial Resource Strain (CARDIA)    Difficulty of Paying Living Expenses: Not hard at all  Food Insecurity: No Food Insecurity (05/08/2024)   Epic    Worried About Radiation Protection Practitioner of Food in the Last Year: Never true    Ran Out of Food in the Last Year: Never true  Transportation Needs: No Transportation Needs (05/08/2024)   Epic    Lack of Transportation (Medical): No    Lack of Transportation (Non-Medical): No  Physical Activity: Inactive (12/01/2023)   Received from [facility]   Exercise Vital Sign    On average, how many days per week do you engage in moderate to strenuous exercise (like a brisk walk)?: 0 days    On average, how many minutes do you engage in exercise at this level?: 0 min  Stress: No Stress Concern Present (12/01/2023)   Received from [facility]   [employer] of Occupational  Health - Occupational Stress Questionnaire    Feeling of Stress : Not at all  Social Connections: Moderately Integrated (05/08/2024)   Social Connection and Isolation Panel    Frequency of Communication with Friends and Family: More than three times a week    Frequency of Social  Gatherings with Friends and Family: More than three times a week    Attends Religious Services: 1 to 4 times per year    Active Member of [employer] or Organizations: Yes    Attends Banker Meetings: More than 4 times per year    Marital Status: Widowed  Intimate Partner Violence: Not At Risk (05/08/2024)   Epic    Fear of Current or Ex-Partner: No    Emotionally Abused: No    Physically Abused: No    Sexually Abused: No  Depression (PHQ2-9): Not on file  Alcohol Screen: Not on file  Housing: Low Risk (05/08/2024)   Epic    Unable to Pay for Housing in the Last Year: No    Number of Times Moved in the Last Year: 0    Homeless in the Last Year: No  Utilities: Not At Risk (05/08/2024)   Epic    Threatened with loss of utilities: No  Health Literacy: Adequate Health Literacy (12/01/2023)   Received from [facility]   B1300 Health Literacy    Frequency of need for help with medical instructions: Never    Family History:   Family History  Problem Relation Age of Onset   Colon cancer Mother    Heart attack Other    Heart attack Maternal Grandmother    Hypertension Sister    Hypertension Brother    Diabetes Paternal Grandmother    Breast cancer Neg Hx     ROS:  Please see the history of present illness.  All other ROS reviewed and negative.     Physical Exam/Data: Vitals:   07/29/24 1236 07/29/24 1240 07/29/24 1245  BP:   (!) 94/39  Resp:   13  Temp:  97.6 F (36.4 C)   TempSrc:  Oral   SpO2: 98%    Weight:  104 kg   Height:  5' 5 (1.651 m)    No intake or output data in the 24 hours ending 07/29/24 1458    07/29/2024   12:40 PM 06/21/2024    1:41 PM 06/01/2024   12:00 PM  Last 3 Weights  Weight (lbs) 229 lb 4.5 oz 229 lb 4.5 oz 230 lb  Weight (kg) 104 kg 104 kg 104.327 kg     Body mass index is 38.15 kg/m.   General:  Well nourished, well developed, in no acute distress HEENT: normal Neck: Unable to examine due to body but  is Vascular: No carotid bruits; Distal pulses 2+ bilaterally Cardiac:  normal S1, S2; RRR; ESM Lungs:  clear to auscultation bilaterally, no wheezing, rhonchi or rales  Abd: soft, nontender, no hepatomegaly  Ext: no edema Musculoskeletal:  No deformities, BUE and BLE strength normal and equal Skin: warm and dry  Neuro:  CNs 2-12 intact, no focal abnormalities noted Psych:  Normal affect   EKG:  The EKG was personally reviewed and demonstrates: Sinus rhythm  Relevant CV Studies:  Echocardiogram, 03/04/2024 Left ventricular ejection fraction, by estimation, is 65 to 70% . Left ventricular ejection fraction by PLAX is 67 % . The left ventricle has normal function. The left ventricle has no regional wall motion abnormalities. There is mild left ventricular  hypertrophy. Left ventricular diastolic parameters are consistent with Grade I diastolic dysfunction ( impaired relaxation) . Elevated left ventricular end- diastolic pressure. The E/ e' is 22.  Right ventricular systolic function is normal. The right ventricular size is normal. Tricuspid regurgitation signal is inadequate for assessing PA pressure.  Left atrial size was moderately dilated.  The mitral valve is degenerative. Trivial mitral valve regurgitation. Moderate mitral stenosis. The mean mitral valve gradient is 8. 0 mmHg with average heart rate of 85 bpm. Moderate to severe mitral annular calcification. MVA 1. 47 cm2 by VTI.  The aortic valve is tricuspid. There is moderate calcification of the aortic valve. Aortic valve regurgitation is mild. Moderate to severe aortic valve stenosis. Aortic valve area, by VTI measures 1. 03 cm . Aortic valve mean gradient measures 26. 0 mmHg. Aortic valve Vmax measures 3. 31 m/ s. Peak gradient 43. 8 mmHg, DI 0. 36.  The inferior vena cava is dilated in size with < 50% respiratory variability, suggesting right atrial pressure of 15 mmHg.  Comparison( s) : Changes from prior study are noted. 3/ 10/ 2023:  LVEF 60- 65% , mild MS - MG 6 mmHg, moderate MAC, mild AI, mild AS- MG 22 mmHg.  Cardiac cath, 04/29/2017 Conclusions: Widely patent mid LAD stent. No significant change in mild to moderate LCx, OM, and diagonal disease compared with 09/2016. Normal left ventricular filling pressure.   Recommendations: Continue medical therapy and secondary prevention. I will restart isosorbide  mononitrate at 90 mg daily with hope of weaning off NTG infusion later today. Gentle hydration given CKD and normal LVEDP. Hold furosemide  today and reassess volume status and renal function tomorrow before restarting diuretic therapy  Laboratory Data: High Sensitivity Troponin:  No results for input(s): TROPONINIHS in the last 720 hours.  Recent Labs  Lab 07/29/24 1250  TRNPT 235*      Chemistry Recent Labs  Lab 07/29/24 1250 07/29/24 1320  NA 136 135  K 5.5* 5.1  CL 93*  --   CO2 25  --   GLUCOSE 236*  --   BUN 40*  --   CREATININE 6.98*  --   CALCIUM  10.1  --   GFRNONAA 6*  --   ANIONGAP 19*  --     Recent Labs  Lab 07/29/24 1250  PROT 6.9  ALBUMIN  4.1  AST 26  ALT 27  ALKPHOS 270*  BILITOT 0.3   Lipids No results for input(s): CHOL, TRIG, HDL, LABVLDL, LDLCALC, CHOLHDL in the last 168 hours.  Hematology Recent Labs  Lab 07/29/24 1320 07/29/24 1400  WBC  --  6.9  RBC  --  4.11  HGB 12.2 11.1*  HCT 36.0 37.7  MCV  --  91.7  MCH  --  27.0  MCHC  --  29.4*  RDW  --  16.3*  PLT  --  160   Thyroid  No results for input(s): TSH, FREET4 in the last 168 hours.  BNPNo results for input(s): BNP, PROBNP in the last 168 hours.  DDimer No results for input(s): DDIMER in the last 168 hours.  Radiology/Studies:  CT Head Wo Contrast Result Date: 07/29/2024 EXAM: CT HEAD WITHOUT CONTRAST 07/29/2024 02:23:19 PM TECHNIQUE: CT of the head was performed without the administration of intravenous contrast. Automated exposure control, iterative reconstruction, and/or weight  based adjustment of the mA/kV was utilized to reduce the radiation dose to as low as reasonably achievable. COMPARISON: 10/20/2022 CLINICAL HISTORY: Mental status change, unknown cause FINDINGS: BRAIN AND VENTRICLES: No  acute hemorrhage. No evidence of acute infarct. No hydrocephalus. No extra-axial collection. No mass effect or midline shift. Septum pellucidum is present. Atrophy and white matter changes are mildly advanced for age, stable. Atherosclerotic calcifications are present in the cavernous carotid arteries bilaterally. No hyperdense vessel is present. ORBITS: No acute abnormality. SINUSES: No acute abnormality. SOFT TISSUES AND SKULL: No acute soft tissue abnormality. No skull fracture. IMPRESSION: 1. No acute intracranial abnormality. 2. Mildly advanced atrophy and white matter changes for age, stable. Electronically signed by: Lonni Necessary MD 07/29/2024 02:37 PM EST RP Workstation: [id number]   DG Chest Port 1 View Result Date: 07/29/2024 EXAM: 1 VIEW(S) XRAY OF THE CHEST 07/29/2024 01:19:47 PM COMPARISON: 06/21/2024 CLINICAL HISTORY: Altered mental status. FINDINGS: LINES, TUBES AND DEVICES: Vascular stent in left arm. LUNGS AND PLEURA: Low lung volumes. Increased interstitial markings with differential diagnosis including pulmonary edema versus atypical infection. No pleural effusion. No pneumothorax. HEART AND MEDIASTINUM: Atherosclerotic plaque. No acute abnormality of the cardiac and mediastinal silhouettes. BONES AND SOFT TISSUES: No acute osseous abnormality. IMPRESSION: 1. Increased interstitial markings, differential diagnosis includes pulmonary edema versus atypical infection. 2. Low lung volumes. Electronically signed by: Waddell Calk MD 07/29/2024 02:04 PM EST RP Workstation: [id number]   Assessment and Plan:  Acute encephalopathy Presented with AMS, unresponsiveness this morning prior to dialysis Struggled with this in the past in the setting of polypharmacy Unclear if  patient had syncope. Pt cannot recall if she had syncope today. Current home meds: oxycodone  10 mg 3 times daily, pregabalin  100 mg twice daily Noted to have pinpoint pupils, BP 74/34 via EMS Nephrology following, arranging dialysis Ordered echocardiogram, will follow results Monitor on telemetry  Moderate to severe aortic stenosis Mild aortic regurgitation Moderate mitral stenosis Chronic HFpEF Echo 02/2024: LVEF 65 to 70%, mild LVH, G1 DD, normal RV systolic function, moderate mitral stenosis, moderate to severe MAC mild aortic regurgitation, moderate to severe aortic stenosis, mean gradient 26 mmHg, peak gradient 43 mmHg, AVA by VTI 1.03 cm, DI 0.36 Presents today with syncope Home meds: Torsemide  100 mg on nondialysis days Update echocardiogram this admission to assess valve function Continue to manage volume status with dialysis  CAD s/p stent LAD in 2018  Hyperlipidemia LHC 04/2017: Patent and LAD stent, no significant change to mild to moderate LCx, OM, diagonal disease, normal LV filling pressures Home meds: Aspirin  81 mg daily, Lipitor  80 mg daily, as needed sublingual nitroglycerin  Denies any prior or current chest pain Nonischemic EKG Initial troponin 235, the setting of ESRD on dialysis Continue home medications  Per primary Altered mental status ESRD on HD Chronic anemia Metabolic bone disease Type 2 diabetes OSA Electrolyte disturbances Chronic pain on chronic opioids Hypotension GERD COPD Mood disorders   For questions or updates, please contact [facility] Please consult [email or web address] for contact info under    Hodan Wurtz Priya Jedrick Hutcherson, MD [place]  [facility]  4:44 PM     [1]  Allergies Allergen Reactions   Shellfish Allergy Anaphylaxis and Swelling   Diazepam  Other (See Comments)    felt like out of body experience   Morphine  Itching and Other (See Comments)    Tolerated hydromorphone  on 07/2020 *will take along with  Benadryl *   "

## 2024-07-30 ENCOUNTER — Other Ambulatory Visit: Payer: Self-pay

## 2024-07-30 ENCOUNTER — Encounter (HOSPITAL_COMMUNITY): Payer: Self-pay | Admitting: Internal Medicine

## 2024-07-30 ENCOUNTER — Other Ambulatory Visit: Payer: Self-pay | Admitting: Physician Assistant

## 2024-07-30 ENCOUNTER — Observation Stay (HOSPITAL_COMMUNITY)

## 2024-07-30 DIAGNOSIS — I5A Non-ischemic myocardial injury (non-traumatic): Secondary | ICD-10-CM

## 2024-07-30 DIAGNOSIS — R7989 Other specified abnormal findings of blood chemistry: Secondary | ICD-10-CM

## 2024-07-30 DIAGNOSIS — I351 Nonrheumatic aortic (valve) insufficiency: Secondary | ICD-10-CM | POA: Diagnosis not present

## 2024-07-30 DIAGNOSIS — N186 End stage renal disease: Secondary | ICD-10-CM | POA: Diagnosis not present

## 2024-07-30 DIAGNOSIS — I350 Nonrheumatic aortic (valve) stenosis: Secondary | ICD-10-CM | POA: Diagnosis not present

## 2024-07-30 DIAGNOSIS — R4182 Altered mental status, unspecified: Secondary | ICD-10-CM | POA: Diagnosis not present

## 2024-07-30 DIAGNOSIS — R55 Syncope and collapse: Secondary | ICD-10-CM

## 2024-07-30 DIAGNOSIS — I050 Rheumatic mitral stenosis: Secondary | ICD-10-CM

## 2024-07-30 DIAGNOSIS — I2510 Atherosclerotic heart disease of native coronary artery without angina pectoris: Secondary | ICD-10-CM | POA: Diagnosis not present

## 2024-07-30 DIAGNOSIS — E875 Hyperkalemia: Secondary | ICD-10-CM

## 2024-07-30 DIAGNOSIS — G9340 Encephalopathy, unspecified: Secondary | ICD-10-CM | POA: Diagnosis not present

## 2024-07-30 LAB — LACTIC ACID, PLASMA: Lactic Acid, Venous: 1.1 mmol/L (ref 0.5–1.9)

## 2024-07-30 LAB — ECHOCARDIOGRAM COMPLETE
AR max vel: 1.18 cm2
AV Area VTI: 1.16 cm2
AV Area mean vel: 1.14 cm2
AV Mean grad: 23 mmHg
AV Peak grad: 41 mmHg
Ao pk vel: 3.2 m/s
Area-P 1/2: 3.91 cm2
Height: 65 in
MV VTI: 1.69 cm2
P 1/2 time: 104 ms
S' Lateral: 3.5 cm
Single Plane A4C EF: 52.6 %
Weight: 3643.76 [oz_av]

## 2024-07-30 LAB — CBC
HCT: 38.9 % (ref 36.0–46.0)
Hemoglobin: 12.1 g/dL (ref 12.0–15.0)
MCH: 27.8 pg (ref 26.0–34.0)
MCHC: 31.1 g/dL (ref 30.0–36.0)
MCV: 89.4 fL (ref 80.0–100.0)
Platelets: 142 K/uL — ABNORMAL LOW (ref 150–400)
RBC: 4.35 MIL/uL (ref 3.87–5.11)
RDW: 16.4 % — ABNORMAL HIGH (ref 11.5–15.5)
WBC: 6.5 K/uL (ref 4.0–10.5)
nRBC: 0 % (ref 0.0–0.2)

## 2024-07-30 LAB — COMPREHENSIVE METABOLIC PANEL WITH GFR
ALT: 22 U/L (ref 0–44)
AST: 23 U/L (ref 15–41)
Albumin: 4.1 g/dL (ref 3.5–5.0)
Alkaline Phosphatase: 273 U/L — ABNORMAL HIGH (ref 38–126)
Anion gap: 16 — ABNORMAL HIGH (ref 5–15)
BUN: 22 mg/dL (ref 8–23)
CO2: 26 mmol/L (ref 22–32)
Calcium: 10 mg/dL (ref 8.9–10.3)
Chloride: 93 mmol/L — ABNORMAL LOW (ref 98–111)
Creatinine, Ser: 4.38 mg/dL — ABNORMAL HIGH (ref 0.44–1.00)
GFR, Estimated: 11 mL/min — ABNORMAL LOW
Glucose, Bld: 133 mg/dL — ABNORMAL HIGH (ref 70–99)
Potassium: 4.2 mmol/L (ref 3.5–5.1)
Sodium: 135 mmol/L (ref 135–145)
Total Bilirubin: 0.3 mg/dL (ref 0.0–1.2)
Total Protein: 7 g/dL (ref 6.5–8.1)

## 2024-07-30 LAB — GLUCOSE, CAPILLARY
Glucose-Capillary: 191 mg/dL — ABNORMAL HIGH (ref 70–99)
Glucose-Capillary: 203 mg/dL — ABNORMAL HIGH (ref 70–99)
Glucose-Capillary: 227 mg/dL — ABNORMAL HIGH (ref 70–99)
Glucose-Capillary: 318 mg/dL — ABNORMAL HIGH (ref 70–99)
Glucose-Capillary: 402 mg/dL — ABNORMAL HIGH (ref 70–99)

## 2024-07-30 LAB — VITAMIN B12: Vitamin B-12: 602 pg/mL (ref 180–914)

## 2024-07-30 MED ORDER — PREGABALIN 75 MG PO CAPS
75.0000 mg | ORAL_CAPSULE | Freq: Two times a day (BID) | ORAL | Status: DC
  Administered 2024-07-30 – 2024-08-02 (×8): 75 mg via ORAL
  Filled 2024-07-30 (×8): qty 1

## 2024-07-30 MED ORDER — CYCLOBENZAPRINE HCL 5 MG PO TABS: 5.0000 mg | ORAL_TABLET | Freq: Three times a day (TID) | ORAL | Status: DC | PRN

## 2024-07-30 MED ORDER — OXYCODONE HCL 5 MG PO TABS: 5.0000 mg | ORAL_TABLET | Freq: Four times a day (QID) | ORAL | 0 refills | Status: DC | PRN

## 2024-07-30 MED ORDER — OXYCODONE HCL 5 MG PO TABS
5.0000 mg | ORAL_TABLET | Freq: Four times a day (QID) | ORAL | Status: DC | PRN
  Administered 2024-07-30: 5 mg via ORAL
  Filled 2024-07-30: qty 1

## 2024-07-30 MED ORDER — OXYCODONE HCL 5 MG PO TABS
10.0000 mg | ORAL_TABLET | Freq: Three times a day (TID) | ORAL | Status: DC
  Administered 2024-07-30 – 2024-08-04 (×12): 10 mg via ORAL
  Filled 2024-07-30 (×13): qty 2

## 2024-07-30 MED ORDER — DULOXETINE HCL 30 MG PO CPEP
30.0000 mg | ORAL_CAPSULE | Freq: Every day | ORAL | Status: DC
  Administered 2024-07-30 – 2024-08-04 (×6): 30 mg via ORAL
  Filled 2024-07-30 (×6): qty 1

## 2024-07-30 MED ORDER — PREGABALIN 75 MG PO CAPS: 75.0000 mg | ORAL_CAPSULE | Freq: Two times a day (BID) | ORAL | 0 refills | Status: AC

## 2024-07-30 MED ORDER — HYDROMORPHONE HCL 1 MG/ML IJ SOLN
0.5000 mg | INTRAMUSCULAR | Status: DC | PRN
  Administered 2024-07-31 – 2024-08-03 (×6): 0.5 mg via INTRAVENOUS
  Filled 2024-07-30 (×6): qty 0.5

## 2024-07-30 NOTE — Progress Notes (Signed)
 " [facility] Progress Note   Subjective:   Seen in room. Much more alert today. Denies any confusion, CP, dizziness, nausea, SOB. Reports she has had diarrhea for about a month.   Objective Vitals:   07/29/24 2000 07/29/24 2004 07/29/24 2117 07/30/24 0519  BP: (!) 135/56 (!) 145/58 (!) 144/56 (!) 125/44  Pulse: 71 72 75 78  Resp: (!) 0 11 13 16   Temp:  97.8 F (36.6 C) 98.6 F (37 C) 98.4 F (36.9 C)  TempSrc:  Oral Oral Oral  SpO2: 97% 94% 100% 95%  Weight:  101.4 kg 103.3 kg   Height:   5' 5 (1.651 m)    Physical Exam General: Alert female in NAD Heart: RRR, no murmur Lungs: CTA bilaterally, respirations unlabored Abdomen: Soft, non-distended, +BS Extremities: trace edema b/l lower extremities Dialysis Access:  AVF + t/b  Additional Objective Labs: Basic Metabolic Panel: Recent Labs  Lab 07/29/24 1250 07/29/24 1320 07/30/24 0406  NA 136 135 135  K 5.5* 5.1 4.2  CL 93*  --  93*  CO2 25  --  26  GLUCOSE 236*  --  133*  BUN 40*  --  22  CREATININE 6.98*  --  4.38*  CALCIUM  10.1  --  10.0   Liver Function Tests: Recent Labs  Lab 07/29/24 1250 07/30/24 0406  AST 26 23  ALT 27 22  ALKPHOS 270* 273*  BILITOT 0.3 0.3  PROT 6.9 7.0  ALBUMIN  4.1 4.1   No results for input(s): LIPASE, AMYLASE in the last 168 hours. CBC: Recent Labs  Lab 07/29/24 1320 07/29/24 1400 07/30/24 0406  WBC  --  6.9 6.5  NEUTROABS  --  4.6  --   HGB 12.2 11.1* 12.1  HCT 36.0 37.7 38.9  MCV  --  91.7 89.4  PLT  --  160 142*   Blood Culture    Component Value Date/Time   SDES BLOOD RIGHT ARM 07/29/2024 1400   SPECREQUEST  07/29/2024 1400    BOTTLES DRAWN AEROBIC AND ANAEROBIC Blood Culture adequate volume   CULT  07/29/2024 1400    NO GROWTH < 24 HOURS Performed at [facility], [address] [place], [place] [address]    REPTSTATUS PENDING 07/29/2024 1400    Cardiac Enzymes: No results for input(s): CKTOTAL, CKMB, CKMBINDEX,  TROPONINI in the last 168 hours. CBG: Recent Labs  Lab 07/30/24 0822  GLUCAP 191*   Iron  Studies: No results for input(s): IRON , TIBC, TRANSFERRIN, FERRITIN in the last 72 hours. @lablastinr3 @ Studies/Results: CT Head Wo Contrast Result Date: 07/29/2024 EXAM: CT HEAD WITHOUT CONTRAST 07/29/2024 02:23:19 PM TECHNIQUE: CT of the head was performed without the administration of intravenous contrast. Automated exposure control, iterative reconstruction, and/or weight based adjustment of the mA/kV was utilized to reduce the radiation dose to as low as reasonably achievable. COMPARISON: 10/20/2022 CLINICAL HISTORY: Mental status change, unknown cause FINDINGS: BRAIN AND VENTRICLES: No acute hemorrhage. No evidence of acute infarct. No hydrocephalus. No extra-axial collection. No mass effect or midline shift. Septum pellucidum is present. Atrophy and white matter changes are mildly advanced for age, stable. Atherosclerotic calcifications are present in the cavernous carotid arteries bilaterally. No hyperdense vessel is present. ORBITS: No acute abnormality. SINUSES: No acute abnormality. SOFT TISSUES AND SKULL: No acute soft tissue abnormality. No skull fracture. IMPRESSION: 1. No acute intracranial abnormality. 2. Mildly advanced atrophy and white matter changes for age, stable. Electronically signed by: Lonni Necessary MD 07/29/2024 02:37 PM EST RP  Workstation: [id number]   DG Chest Port 1 View Result Date: 07/29/2024 EXAM: 1 VIEW(S) XRAY OF THE CHEST 07/29/2024 01:19:47 PM COMPARISON: 06/21/2024 CLINICAL HISTORY: Altered mental status. FINDINGS: LINES, TUBES AND DEVICES: Vascular stent in left arm. LUNGS AND PLEURA: Low lung volumes. Increased interstitial markings with differential diagnosis including pulmonary edema versus atypical infection. No pleural effusion. No pneumothorax. HEART AND MEDIASTINUM: Atherosclerotic plaque. No acute abnormality of the cardiac and mediastinal silhouettes.  BONES AND SOFT TISSUES: No acute osseous abnormality. IMPRESSION: 1. Increased interstitial markings, differential diagnosis includes pulmonary edema versus atypical infection. 2. Low lung volumes. Electronically signed by: Waddell Calk MD 07/29/2024 02:04 PM EST RP Workstation: [id number]   Medications:  anticoagulant sodium citrate       aspirin  EC  81 mg Oral Daily   atorvastatin   80 mg Oral QHS   Chlorhexidine  Gluconate Cloth  6 each Topical Q0600   diclofenac  Sodium  4 g Topical QID   heparin   2,500 Units Dialysis Once in dialysis   heparin   5,000 Units Subcutaneous Q8H   insulin  aspart  0-6 Units Subcutaneous TID WC   lidocaine   2 patch Transdermal Daily   pantoprazole   40 mg Oral Daily   pregabalin   75 mg Oral BID   sodium chloride  flush  3 mL Intravenous Q12H    Dialysis Orders: Center: [facility]  on TTS . 180NRe Time: 4 hr 30 min BFR 450 DFR A 1.5 EDW 102.5kg 2K 2.5Ca AVF 15g Heparin  3000 unit bolus  Mircera 225mcg IV q 2 weeks Hectorol  8mcg IV q HD Sensipar  30mg  PO q HD  Assessment/Plan:  AMS: Reportedly began abruptly upon arrival to HD. Work up ongoing per primary team. Does not appear to be uremic based on labs. Does report taking lyrica  and a muscle relaxer pre HD, improvement post dialysis suggests possible polypharmacy   ESRD:  On TTS schedule. Next HD Tuesay  Hypertension/volume: Mild volume overload, continue to titrate down volume   Anemia: Hgb 12.1. No ESA indicated  Metabolic bone disease: Calcium  slightly elevated, no VDRA for now. Follow phos  Nutrition:  Currently NPO Diarrhea: reports ongoing for 1 month, per primary team  [name], PA-C 07/30/2024, 9:56 AM  [facility] Pager: [phone]   "

## 2024-07-30 NOTE — Evaluation (Addendum)
 Physical Therapy Evaluation Patient Details Name: [name] MRN: [id number] DOB: 22-May-1956 Today's Date: 07/30/2024  History of Present Illness  The pt is a 69 yo female presenting 1/17 after pt with AMS and fall out of her car (landing on knees) at HD. Pt hypotensive and with pinpoint pupils with EMS. Admitted for management of acute encephalopathy due to polypharmacy. PMH includes: arthritis (bilateral knees), ESRD on HD, CAD, CHF, DM II, HTN, MI, obesity (BMI 37).   Clinical Impression  Pt in bed upon arrival of PT, agreeable to evaluation at this time. Prior to admission the pt was ambulating at home with use of cane and reports intermittent use of RW depending on chronic knee pain. Pt lives with her son and his significant other ina home with 4 stairs to enter, pt typically able to ambulate in the house (~100 ft) and assist with some cooking. The pt presents with significant limitation in bilateral knee ROM and limited tolerance of wt bearing due to pain in bilateral knees after her fall. Pt unable to generate more than a few inches of hip clearance despite use of momentum, DME, and maxA at this time. Given pt needs more assistance than is available at home, recommend continued inpatient therapies <3hours/day at this time, however, with improved pain control I believe pt could reasonably progress to d/c home with HHPT. Pt eager to progress mobility, hopeful for return home with family support once physically able.         If plan is discharge home, recommend the following: Two people to help with walking and/or transfers;Two people to help with bathing/dressing/bathroom;Assistance with cooking/housework;Assist for transportation;Help with stairs or ramp for entrance   Can travel by private vehicle   No    Equipment Recommendations Wheelchair (measurements PT);Wheelchair cushion (measurements PT);Hoyer lift;Rolling walker (2 wheels)  Recommendations for Other Services       Functional  Status Assessment Patient has had a recent decline in their functional status and demonstrates the ability to make significant improvements in function in a reasonable and predictable amount of time.     Precautions / Restrictions Precautions Precautions: Fall Recall of Precautions/Restrictions: Intact Restrictions Weight Bearing Restrictions Per Provider Order: No      Mobility  Bed Mobility Overal bed mobility: Needs Assistance Bed Mobility: Supine to Sit, Sit to Supine     Supine to sit: Min assist, HOB elevated, Used rails Sit to supine: Min assist, HOB elevated, Used rails   General bed mobility comments: minA to manage LE movement to EOB and back into bed, pt able to elevate truk without assist    Transfers Overall transfer level: Needs assistance Equipment used: Rolling walker (2 wheels) Transfers: Sit to/from Stand, Bed to chair/wheelchair/BSC Sit to Stand: Max assist          Lateral/Scoot Transfers: Contact guard assist General transfer comment: pt unable to achieve stand despite use of momentum, elevated bed, and maxA due to pain in knees. able to scoot laterally along EOB with minimal clearance of hips from bed    Ambulation/Gait               General Gait Details: pt unable to achieve stand      Balance Overall balance assessment: Needs assistance Sitting-balance support: No upper extremity supported, Feet supported Sitting balance-Leahy Scale: Good  Pertinent Vitals/Pain Pain Assessment Pain Assessment: 0-10 Pain Score: 10-Worst pain ever Pain Location: bilateral knees, L slightly worse than R Pain Descriptors / Indicators: Discomfort, Grimacing, Sore Pain Intervention(s): Limited activity within patient's tolerance, Monitored during session, Repositioned    Home Living Family/patient expects to be discharged to:: Private residence Living Arrangements: Children Available Help at  Discharge: Family;Available 24 hours/day Type of Home: House Home Access: Stairs to enter Entrance Stairs-Rails: Left Entrance Stairs-Number of Steps: 4   Home Layout: One level Home Equipment: Cane - quad;BSC/3in1;Toilet riser;Standard Environmental Consultant      Prior Function Prior Level of Function : Needs assist             Mobility Comments: walking with cane in her house, standard walker when pain is elevated, no other falls in last 6 months ADLs Comments: reports assist from daughter to get into and out of shower, but independent with dressing and assists some as tolerated with cooking     Extremity/Trunk Assessment   Upper Extremity Assessment Upper Extremity Assessment: Generalized weakness    Lower Extremity Assessment Lower Extremity Assessment: RLE deficits/detail;LLE deficits/detail RLE Deficits / Details: limited due to pain, tolerating partial ROM in supine and minimal resistance. RLE: Unable to fully assess due to pain RLE Sensation: WNL RLE Coordination: WNL LLE Deficits / Details: limited due to pain, tolerating partial ROM in supine and minimal resistance. LLE: Unable to fully assess due to pain LLE Sensation: WNL LLE Coordination: WNL    Cervical / Trunk Assessment Cervical / Trunk Assessment: Other exceptions Cervical / Trunk Exceptions: increased body habitus  Communication   Communication Communication: No apparent difficulties    Cognition Arousal: Alert Behavior During Therapy: WFL for tasks assessed/performed   PT - Cognitive impairments: No apparent impairments                         Following commands: Intact       Cueing Cueing Techniques: Verbal cues     General Comments General comments (skin integrity, edema, etc.): VSS on RA    Exercises General Exercises - Lower Extremity Ankle Circles/Pumps: AROM, Both, 10 reps Quad Sets: AROM, Both, 5 reps, Supine Short Arc Quad: AROM, Both, 5 reps, Supine Heel Slides: AROM, Both, 5  reps, Supine Hip ABduction/ADduction: AAROM, Both, 5 reps, Supine   Assessment/Plan    PT Assessment Patient needs continued PT services  PT Problem List Decreased strength;Decreased range of motion;Decreased activity tolerance;Decreased mobility;Decreased balance;Pain       PT Treatment Interventions DME instruction;Gait training;Stair training;Functional mobility training;Therapeutic activities;Therapeutic exercise;Balance training;Patient/family education    PT Goals (Current goals can be found in the Care Plan section)  Acute Rehab PT Goals Patient Stated Goal: to reduce pain and return home PT Goal Formulation: With patient Time For Goal Achievement: 08/13/24 Potential to Achieve Goals: Good    Frequency Min 2X/week        AM-PAC PT 6 Clicks Mobility  Outcome Measure Help needed turning from your back to your side while in a flat bed without using bedrails?: A Little Help needed moving from lying on your back to sitting on the side of a flat bed without using bedrails?: A Little Help needed moving to and from a bed to a chair (including a wheelchair)?: Total Help needed standing up from a chair using your arms (e.g., wheelchair or bedside chair)?: Total Help needed to walk in hospital room?: Total Help needed climbing 3-5 steps with a railing? :  Total 6 Click Score: 10    End of Session Equipment Utilized During Treatment: Gait belt Activity Tolerance: Patient limited by pain Patient left: in bed;with call bell/phone within reach;with bed alarm set Nurse Communication: Mobility status PT Visit Diagnosis: Unsteadiness on feet (R26.81);Muscle weakness (generalized) (M62.81);Pain Pain - Right/Left: Left Pain - part of body: Knee (bilateral)    Time: 8352-8276 PT Time Calculation (min) (ACUTE ONLY): 36 min   Charges:   PT Evaluation $PT Eval Low Complexity: 1 Low PT Treatments $Therapeutic Exercise: 8-22 mins PT General Charges $$ ACUTE PT VISIT: 1 Visit          Izetta Call, PT, DPT   Acute Rehabilitation Department Office [phone] Secure Chat Communication Preferred  Izetta JULIANNA Call 07/30/2024, 5:58 PM

## 2024-07-30 NOTE — Plan of Care (Signed)

## 2024-07-30 NOTE — Progress Notes (Signed)
" °   07/29/24 2004  Vitals  Temp 97.8 F (36.6 C)  Temp Source Oral  BP (!) 145/58  MAP (mmHg) 80  BP Location Right Arm  BP Method Automatic  Patient Position (if appropriate) Lying  Pulse Rate 72  Pulse Rate Source Monitor  ECG Heart Rate 73  Resp 11  Weight 101.4 kg  Type of Weight Post-Dialysis  Oxygen  Therapy  SpO2 94 %  O2 Device Room Air  Patient Activity (if Appropriate) In bed  Pulse Oximetry Type Continuous  During Treatment Monitoring  Blood Flow Rate (mL/min) 0 mL/min  Arterial Pressure (mmHg) -55.55 mmHg  Venous Pressure (mmHg) 120.4 mmHg  TMP (mmHg) 12.73 mmHg  Ultrafiltration Rate (mL/min) 901 mL/min  Dialysate Flow Rate (mL/min) 299 ml/min  Dialysate Potassium Concentration 2  Dialysate Calcium  Concentration 2.5  Duration of HD Treatment -hour(s) 3.5 hour(s)  Cumulative Fluid Removed (mL) per Treatment  1900.16  HD Safety Checks Performed Yes  Intra-Hemodialysis Comments Tolerated well;Tx completed  Post Treatment  Dialyzer Clearance Lightly streaked  Liters Processed 84  Fluid Removed (mL) 1900 mL  Tolerated HD Treatment Yes  AVG/AVF Arterial Site Held (minutes) 15 minutes  AVG/AVF Venous Site Held (minutes) 15 minutes  Fistula / Graft Left Upper arm Arteriovenous fistula  Placement Date/Time: 07/24/22 0745   Placed prior to admission: Yes  Orientation: Left  Access Location: Upper arm  Access Type: Arteriovenous fistula  Site Condition No complications  Fistula / Graft Assessment Present;Thrill;Bruit  Status Deaccessed  Needle Size 15  Drainage Description None   Received patient in bed to unit.  Alert and oriented.  Informed consent signed and in chart.   TX duration: 3:30  Patient tolerated well.  Transported back to the room  Alert, without acute distress.  Hand-off given to patient's nurse. Shift RN  Access used: AVG Access issues: None  Total UF removed: 1900 Medication(s) given: Vancomycin  Post HD VS: [id number]  B/P145/58 Post HD weight: 101.4kg  Neville Seip, RN [facility] "

## 2024-07-30 NOTE — Progress Notes (Signed)
 Monitor requested by Dr. Mallipeddi. Pt seen in consultation for ?syncope.  Glendia Ferrier, PA-C    07/30/2024 2:05 PM

## 2024-07-30 NOTE — Plan of Care (Signed)
   Problem: Coping: Goal: Ability to adjust to condition or change in health will improve Outcome: Progressing

## 2024-07-30 NOTE — Progress Notes (Signed)
 "   Progress Note  Patient Name: [name] Date of Encounter: 07/30/2024  Primary Cardiologist: None  Subjective   Alert and oriented.  She does not remember seeing me yesterday.  Inpatient Medications    Scheduled Meds:  aspirin  EC  81 mg Oral Daily   atorvastatin   80 mg Oral QHS   Chlorhexidine  Gluconate Cloth  6 each Topical Q0600   diclofenac  Sodium  4 g Topical QID   heparin   2,500 Units Dialysis Once in dialysis   heparin   5,000 Units Subcutaneous Q8H   insulin  aspart  0-6 Units Subcutaneous TID WC   lidocaine   2 patch Transdermal Daily   pantoprazole   40 mg Oral Daily   pregabalin   75 mg Oral BID   sodium chloride  flush  3 mL Intravenous Q12H   Continuous Infusions:  anticoagulant sodium citrate      PRN Meds: acetaminophen  **OR** acetaminophen , albuterol , alteplase , anticoagulant sodium citrate , feeding supplement (NEPRO CARB STEADY), heparin , lidocaine  (PF), lidocaine -prilocaine , oxyCODONE , pentafluoroprop-tetrafluoroeth, polyethylene glycol   Vital Signs    Vitals:   07/29/24 2000 07/29/24 2004 07/29/24 2117 07/30/24 0519  BP: (!) 135/56 (!) 145/58 (!) 144/56 (!) 125/44  Pulse: 71 72 75 78  Resp: (!) 0 11 13 16   Temp:  97.8 F (36.6 C) 98.6 F (37 C) 98.4 F (36.9 C)  TempSrc:  Oral Oral Oral  SpO2: 97% 94% 100% 95%  Weight:  101.4 kg 103.3 kg   Height:   5' 5 (1.651 m)     Intake/Output Summary (Last 24 hours) at 07/30/2024 1003 Last data filed at 07/29/2024 2134 Gross per 24 hour  Intake 240 ml  Output 1900 ml  Net -1660 ml   Filed Weights   07/29/24 1600 07/29/24 2004 07/29/24 2117  Weight: 104.1 kg 101.4 kg 103.3 kg    Telemetry     Personally reviewed.  ECG    Not performed today.  Physical Exam   GEN: No acute distress.   Neck: No JVD. Cardiac: RRR, no murmur, rub, or gallop.  Respiratory: Nonlabored. Clear to auscultation bilaterally. GI: Soft, nontender, bowel sounds present. MS: No edema; No deformity. Neuro:   Nonfocal. Psych: Alert and oriented x 3. Normal affect.  Labs    Chemistry Recent Labs  Lab 07/29/24 1250 07/29/24 1320 07/30/24 0406  NA 136 135 135  K 5.5* 5.1 4.2  CL 93*  --  93*  CO2 25  --  26  GLUCOSE 236*  --  133*  BUN 40*  --  22  CREATININE 6.98*  --  4.38*  CALCIUM  10.1  --  10.0  PROT 6.9  --  7.0  ALBUMIN  4.1  --  4.1  AST 26  --  23  ALT 27  --  22  ALKPHOS 270*  --  273*  BILITOT 0.3  --  0.3  GFRNONAA 6*  --  11*  ANIONGAP 19*  --  16*     Hematology Recent Labs  Lab 07/29/24 1320 07/29/24 1400 07/30/24 0406  WBC  --  6.9 6.5  RBC  --  4.11 4.35  HGB 12.2 11.1* 12.1  HCT 36.0 37.7 38.9  MCV  --  91.7 89.4  MCH  --  27.0 27.8  MCHC  --  29.4* 31.1  RDW  --  16.3* 16.4*  PLT  --  160 142*    Cardiac EnzymesNo results for input(s): TROPONINIHS in the last 720 hours.  BNPNo results for input(s): BNP, PROBNP  in the last 168 hours.   DDimerNo results for input(s): DDIMER in the last 168 hours.   Radiology    CT Head Wo Contrast Result Date: 07/29/2024 EXAM: CT HEAD WITHOUT CONTRAST 07/29/2024 02:23:19 PM TECHNIQUE: CT of the head was performed without the administration of intravenous contrast. Automated exposure control, iterative reconstruction, and/or weight based adjustment of the mA/kV was utilized to reduce the radiation dose to as low as reasonably achievable. COMPARISON: 10/20/2022 CLINICAL HISTORY: Mental status change, unknown cause FINDINGS: BRAIN AND VENTRICLES: No acute hemorrhage. No evidence of acute infarct. No hydrocephalus. No extra-axial collection. No mass effect or midline shift. Septum pellucidum is present. Atrophy and white matter changes are mildly advanced for age, stable. Atherosclerotic calcifications are present in the cavernous carotid arteries bilaterally. No hyperdense vessel is present. ORBITS: No acute abnormality. SINUSES: No acute abnormality. SOFT TISSUES AND SKULL: No acute soft tissue abnormality. No  skull fracture. IMPRESSION: 1. No acute intracranial abnormality. 2. Mildly advanced atrophy and white matter changes for age, stable. Electronically signed by: Lonni Necessary MD 07/29/2024 02:37 PM EST RP Workstation: [id number]   DG Chest Port 1 View Result Date: 07/29/2024 EXAM: 1 VIEW(S) XRAY OF THE CHEST 07/29/2024 01:19:47 PM COMPARISON: 06/21/2024 CLINICAL HISTORY: Altered mental status. FINDINGS: LINES, TUBES AND DEVICES: Vascular stent in left arm. LUNGS AND PLEURA: Low lung volumes. Increased interstitial markings with differential diagnosis including pulmonary edema versus atypical infection. No pleural effusion. No pneumothorax. HEART AND MEDIASTINUM: Atherosclerotic plaque. No acute abnormality of the cardiac and mediastinal silhouettes. BONES AND SOFT TISSUES: No acute osseous abnormality. IMPRESSION: 1. Increased interstitial markings, differential diagnosis includes pulmonary edema versus atypical infection. 2. Low lung volumes. Electronically signed by: Waddell Calk MD 07/29/2024 02:04 PM EST RP Workstation: [id number]    Assessment & Plan    Acute encephalopathy: Onset prior to arrival to HD.  Takes Lyrica  and opioids.  Nonischemic nontraumatic myocardial injury: Unclear why troponins were checked.  Patient did not present with chest pain.  She had chronic sharp chest pains overall.  This is not ACS.  Troponins are flat.  Outpatient ischemia evaluation.  Moderate aortic valve stenosis: ESM on physical exam.  Update echo to r/o severe AS.  Moderate MS: Update echo, as above.  CAD s/p LAD PCI in 2018: Does not have typical angina.  Has chronic sharp chest pains.  Does not need any inpatient cardiac evaluation at this time except updating echo, as above.   [facility] will sign off.   Medication Recommendations: No changes in the home medications from cardiac standpoint Other recommendations (labs, testing, etc): 2-week event monitor, NL Follow up as an outpatient:  Patient prefers to see Dr. Wonda.  Can schedule appointment in 2 to 3 months.  30-minute spent in reviewing prior medical records, reports, more than 3 labs, discussion and documentation.  Signed, Diannah SHAUNNA Maywood, MD  07/30/2024, 10:03 AM    "

## 2024-07-30 NOTE — Care Management Obs Status (Signed)
 MEDICARE OBSERVATION STATUS NOTIFICATION   Patient Details  Name: [name] MRN: [id number] Date of Birth: 1956-01-20   Medicare Observation Status Notification Given:  Yes    Charlann Rayfield Hurst, RN 07/30/2024, 2:27 PM

## 2024-07-30 NOTE — Discharge Summary (Addendum)
 " Physician Discharge Summary   Patient: [name] MRN: [id number] DOB: [birth year]  Admit date:     07/29/2024  Discharge date: 07/30/24  Discharge Physician: Nydia Distance, MD    PCP: [facility]   Recommendations at discharge:   Patient recommended to wean down her pain medications and discussed with her primary physician Lyrica  decreased to 75 mg twice daily (previous dose of 100 mg twice daily) Change oxycodone  to 5 mg every 6 hours as needed for moderate to severe pain  Cardiology recommended 2-week event monitor and outpatient follow-up with Dr. Wonda in 2 to 3 months.  Discharge Diagnoses:  Acute metabolic encephalopathy, likely due to polypharmacy, resolved   COPD (chronic obstructive pulmonary disease) (HCC)   ESRD on hemodialysis (HCC)   Essential hypertension   Type 2 diabetes mellitus with chronic kidney disease, with long-term current use of insulin  (HCC)   OSA (obstructive sleep apnea)   Chronic hepatitis C without hepatic coma (HCC)   Diabetic neuropathy (HCC)   GERD (gastroesophageal reflux disease)   Depression   Asthma   Hyperlipidemia   Chronic gout due to renal impairment of multiple sites without tophus   Chronic diastolic heart failure (HCC)   ESRD (end stage renal disease) on dialysis (HCC)   Iron  deficiency anemia, unspecified   PVD (peripheral vascular disease)   BMI 45.0-49.9, adult (HCC)   Prolonged QT interval   Hospital Course:  Patient is a 69 year old female with HTN, HLP, diabetes, neuropathy, CAD, QT prolongation, PAD, GERD, chronic diastolic CHF, ESRD on hemodialysis, depression, asthma, chronic pain, obesity, OSA, COPD presented with altered mental status.  Patient was reportedly at her baseline on the morning of admission including when her daughter got her in the car to take her to the dialysis.  However on arrival at the dialysis center, patient slumped and was less responsive.  EMS was called.  CBG 199 and continued to be slow to  respond.  No focal neurological deficits.  Patient was brought to ED. Chest x-Gann showed increased interstitial markings which could represent edema versus atypical infection. CT head showed no acute abnormality.   Assessment and Plan:  Acute toxic encephalopathy likely due to polypharmacy -Patient has been taking opioid pain medications, Lyrica ,? Muscle relaxant, Atarax , sertraline , concern for polypharmacy specially in a dialysis patient. - Patient had taken her morning medications and reportedly began abruptly up on arrival to HD center.   - Patient underwent hemodialysis and now back to her baseline mental status alert and oriented.  As her symptoms improved postdialysis suggest possible polypharmacy. -TSH 0.8, lactic acid mildly elevated 2.4, improved to 1.1.  Procalcitonin 0.72, troponin 216 - Recommended to decrease Lyrica , patient has been taking scheduled oxycodone , recommend to change to as needed.  Hold Atarax , tizanidine , patient needs to discuss with her primary provider or pain management,    ESRD on hemodialysis, TTS Chronic diastolic CHF -Received hemodialysis last night per her schedule with improvement in her mental status.  Per nephrology, this is just polypharmacy. -Continue volume management with HD   Elevated troponin possibly demand ischemia CAD No complaints of chest pain, acutely.  There was a concern for possible syncopal event hence cardiology was consulted. Seen by cardiology, Dr Ardelle, recommended 2D echo for further evaluation - Continue ASA, atorvastatin  - 2D echo showed EF 65-70%, moderate AS, has outpatient follow-up scheduled. Cleared by cardiology to discharge home. Event monitor will be mailed to the patient.   Diarrhea -Appears to be resolved  -patient  reported diarrhea for 1 month however improved while inpatient.  C. difficile was ordered.   Hypertension - continue coreg     Hyperlipidemia - Continue home statin   Diabetes mellitus -Placed  on SSI while inpatient   Neuropathy - Recommended to decrease Lyrica    PAD - Continue home ASA, atorvastatin    GERD - Continue PPI     OSA - Continue CPAP   Chronic hepatitis C - Noted - LFTs stable   Anemia - Stable at 11.1 - Trend CBC  Obesity class I Body mass index is 37.9 kg/m.      Pain control - [place]  Controlled Substance Reporting System database was reviewed. and patient was instructed, not to drive, operate heavy machinery, perform activities at heights, swimming or participation in water activities or provide baby-sitting services while on Pain, Sleep and Anxiety Medications; until their outpatient Physician has advised to do so again. Also recommended to not to take more than prescribed Pain, Sleep and Anxiety Medications.  Consultants: neurology, cardiology  Procedures performed: HD, Echo   Disposition: Home Diet recommendation:  Carb modified diet DISCHARGE MEDICATION: Allergies as of 07/30/2024       Reactions   Shellfish Allergy Anaphylaxis, Swelling   Diazepam  Other (See Comments)   felt like out of body experience   Morphine  Itching, Other (See Comments)   Tolerated hydromorphone  on 07/2020 *will take along with Benadryl *        Medication List     STOP taking these medications    HYDROcodone -acetaminophen  5-325 MG tablet Commonly known as: NORCO/VICODIN   hydrOXYzine  25 MG tablet Commonly known as: ATARAX    tiZANidine  4 MG tablet Commonly known as: ZANAFLEX        TAKE these medications    albuterol  108 (90 Base) MCG/ACT inhaler Commonly known as: VENTOLIN  HFA Inhale 2 puffs into the lungs every 4 (four) hours as needed for wheezing or shortness of breath.   aspirin  EC 81 MG tablet Take 1 tablet (81 mg total) by mouth daily.   atorvastatin  80 MG tablet Commonly known as: LIPITOR  Take 1 tablet (80 mg total) by mouth every evening. What changed: when to take this   carvedilol  12.5 MG tablet Commonly known as:  COREG  Take 12.5 mg by mouth 2 (two) times daily.   diclofenac  Sodium 1 % Gel Commonly known as: VOLTAREN  Apply 2 g topically in the morning, at noon, and at bedtime. Left and right knee   fluticasone  50 MCG/ACT nasal spray Commonly known as: FLONASE  Place 2 sprays into both nostrils daily as needed for allergies or rhinitis.   fluticasone  furoate-vilanterol 200-25 MCG/INH Aepb Commonly known as: BREO ELLIPTA  Inhale 1 puff into the lungs daily.   midodrine  10 MG tablet Commonly known as: PROAMATINE  Take 1 tablet (10 mg total) by mouth 3 (three) times daily with meals.   naloxone  4 MG/0.1ML Liqd nasal spray kit Commonly known as: NARCAN  Place 1 spray into the nose daily as needed (for overdose).   nitroGLYCERIN  0.4 MG SL tablet Commonly known as: NITROSTAT  Place 1 tablet (0.4 mg total) under the tongue every 5 (five) minutes x 3 doses as needed for chest pain.   oxyCODONE  5 MG immediate release tablet Commonly known as: Oxy IR/ROXICODONE  Take 1 tablet (5 mg total) by mouth every 6 (six) hours as needed for moderate pain (pain score 4-6) or severe pain (pain score 7-10). What changed:  medication strength how much to take when to take this reasons to take this   Ozempic (  2 MG/DOSE) 8 MG/3ML Sopn Generic drug: Semaglutide (2 MG/DOSE) Inject 2 mg into the skin once a week.   pantoprazole  40 MG tablet Commonly known as: PROTONIX  Take 40 mg by mouth daily before breakfast.   polyethylene glycol powder 17 GM/SCOOP powder Commonly known as: GLYCOLAX /MIRALAX  Dissolve 1 capful (17g) in 4-8 ounces of liquid and take by mouth daily as needed for mild constipation   pregabalin  75 MG capsule Commonly known as: LYRICA  Take 1 capsule (75 mg total) by mouth 2 (two) times daily. What changed:  medication strength how much to take   senna 8.6 MG Tabs tablet Commonly known as: SENOKOT Take 1 tablet (8.6 mg total) by mouth 2 (two) times daily.   sertraline  25 MG tablet Commonly  known as: ZOLOFT  Take 25 mg by mouth daily.   torsemide  100 MG tablet Commonly known as: DEMADEX  Take 1 tablet (100mg ) on NON-dialysis days (Sunday, Monday, Wednesday, Friday)   valACYclovir 500 MG tablet Commonly known as: VALTREX TAKE (1) TABLET BY MOUTH EVERY OTHER DAY. What changed: See the new instructions.   Velphoro 500 MG chewable tablet Generic drug: sucroferric oxyhydroxide Take 1 tablet with meals and snacks               Discharge Care Instructions  (From admission, onward)           Start     Ordered   07/30/24 0000  If the dressing is still on your incision site when you go home, remove it on the third day after your surgery date. Remove dressing if it begins to fall off, or if it is dirty or damaged before the third day.        01 /18/26 1517            Follow-up Information     Wonda Sharper, MD Follow up on 10/31/2024.   Specialty: Cardiology Why: Appt Time: 3:20 PM, Please arrive 15 min early. Bring all medications. Contact information: [address] [place] [address] [phone]         [facility], [address]. Schedule an appointment as soon as possible for a visit in 2 week(s).   Why: for hospital follow-up Contact information: [address] [place] [address] [phone]                Discharge Exam: Fredricka Weights   07/29/24 1600 07/29/24 2004 07/29/24 2117  Weight: 104.1 kg 101.4 kg 103.3 kg   S: Now alert and oriented, no acute complaints.  Overnight hemodialysis completed.  Seen by cardiology today.  Recommended 2D echo if normal okay to discharge home.  BP (!) 125/44 (BP Location: Right Arm)   Pulse 78   Temp 98.4 F (36.9 C) (Oral)   Resp 16   Ht 5' 5 (1.651 m)   Wt 103.3 kg   SpO2 95%   BMI 37.90 kg/m   Physical Exam General: Alert and oriented x 3, NAD Cardiovascular: S1 S2 clear, RRR.  Respiratory: CTAB, no wheezing Gastrointestinal: Soft, nontender, nondistended, NBS Ext: no pedal  edema bilaterally Neuro: no new deficits Psych: Normal affect    Condition at discharge: fair  The results of significant diagnostics from this hospitalization (including imaging, microbiology, ancillary and laboratory) are listed below for reference.   Imaging Studies: ECHOCARDIOGRAM COMPLETE Result Date: 07/30/2024    ECHOCARDIOGRAM REPORT   Patient Name:   [name] Date of Exam: 07/30/2024 Medical Rec #:  [id number]     Height:  65.0 in Accession #:    [id number]    Weight:       227.7 lb Date of Birth:  18-May-1956     BSA:          2.090 m Patient Age:    68 years      BP:           125/44 mmHg Patient Gender: F             HR:           86 bpm. Exam Location:  Inpatient Procedure: 2D Echo, Cardiac Doppler, Color Doppler and PEDOF (Both Spectral and            Color Flow Doppler were utilized during procedure). Indications:    Aortic Stenosis I35.0  History:        Patient has prior history of Echocardiogram examinations, most                 recent 03/04/2024. CHF, CAD and Previous Myocardial Infarction,                 COPD, Aortic Valve Disease; Risk Factors:Dyslipidemia,                 Hypertension, Former Smoker, Diabetes, Sleep Apnea and ESRD,                 GERD.  Sonographer:    Koleen Popper RDCS Referring Phys: WADDELL DELENA DONATH  Sonographer Comments: Image acquisition challenging due to patient body habitus. IMPRESSIONS  1. Left ventricular ejection fraction, by estimation, is 65 to 70%. The left ventricle has normal function. The left ventricle has no regional wall motion abnormalities. Left ventricular diastolic parameters are consistent with Grade I diastolic dysfunction (impaired relaxation).  2. Right ventricular systolic function is normal. The right ventricular size is normal. Tricuspid regurgitation signal is inadequate for assessing PA pressure.  3. The mitral valve is degenerative. Trivial mitral valve regurgitation. Mild to moderate mitral stenosis. The mean mitral  valve gradient is 6.5 mmHg with average heart rate of 86 bpm. Severe mitral annular calcification.  4. The aortic valve was not well visualized. Aortic valve regurgitation is mild. Moderate aortic valve stenosis. Aortic valve area, by VTI measures 1.16 cm. Aortic valve mean gradient measures 23.0 mmHg. Aortic valve Vmax measures 3.20 m/s.  5. The inferior vena cava is normal in size with greater than 50% respiratory variability, suggesting right atrial pressure of 3 mmHg. Comparison(s): No significant change from prior study. FINDINGS  Left Ventricle: Left ventricular ejection fraction, by estimation, is 65 to 70%. The left ventricle has normal function. The left ventricle has no regional wall motion abnormalities. The left ventricular internal cavity size was normal in size. There is  no left ventricular hypertrophy. Left ventricular diastolic parameters are consistent with Grade I diastolic dysfunction (impaired relaxation). Right Ventricle: The right ventricular size is normal. No increase in right ventricular wall thickness. Right ventricular systolic function is normal. Tricuspid regurgitation signal is inadequate for assessing PA pressure. Left Atrium: Left atrial size was normal in size. Right Atrium: Right atrial size was normal in size. Pericardium: There is no evidence of pericardial effusion. Mitral Valve: The mitral valve is degenerative in appearance. Severe mitral annular calcification. Trivial mitral valve regurgitation. Mild to moderate mitral valve stenosis. MV peak gradient, 15.2 mmHg. The mean mitral valve gradient is 6.5 mmHg with average heart rate of 86 bpm. Tricuspid Valve: The tricuspid valve is grossly normal. Tricuspid valve  regurgitation is trivial. No evidence of tricuspid stenosis. Aortic Valve: The aortic valve was not well visualized. Aortic valve regurgitation is mild. Aortic regurgitation PHT measures 104 msec. Moderate aortic stenosis is present. Aortic valve mean gradient measures  23.0 mmHg. Aortic valve peak gradient measures 41.0 mmHg. Aortic valve area, by VTI measures 1.16 cm. Pulmonic Valve: The pulmonic valve was grossly normal. Pulmonic valve regurgitation is not visualized. No evidence of pulmonic stenosis. Aorta: The aortic root and ascending aorta are structurally normal, with no evidence of dilitation. Venous: The inferior vena cava is normal in size with greater than 50% respiratory variability, suggesting right atrial pressure of 3 mmHg. IAS/Shunts: The atrial septum is grossly normal.  LEFT VENTRICLE PLAX 2D LVIDd:         5.00 cm      Diastology LVIDs:         3.50 cm      LV e' medial:    6.64 cm/s LV PW:         1.00 cm      LV E/e' medial:  14.8 LV IVS:        0.90 cm      LV e' lateral:   6.20 cm/s LVOT diam:     1.70 cm      LV E/e' lateral: 15.9 LV SV:         76 LV SV Index:   36 LVOT Area:     2.27 cm  LV Volumes (MOD) LV vol d, MOD A4C: 120.0 ml LV vol s, MOD A4C: 56.9 ml LV SV MOD A4C:     120.0 ml RIGHT VENTRICLE             IVC RV S prime:     10.80 cm/s  IVC diam: 1.30 cm TAPSE (M-mode): 2.1 cm LEFT ATRIUM             Index LA diam:        4.20 cm 2.01 cm/m LA Vol (A2C):   51.8 ml 24.78 ml/m LA Vol (A4C):   54.9 ml 26.26 ml/m LA Biplane Vol: 53.9 ml 25.78 ml/m  AORTIC VALVE AV Area (Vmax):    1.18 cm AV Area (Vmean):   1.14 cm AV Area (VTI):     1.16 cm AV Vmax:           320.00 cm/s AV Vmean:          227.000 cm/s AV VTI:            0.651 m AV Peak Grad:      41.0 mmHg AV Mean Grad:      23.0 mmHg LVOT Vmax:         166.50 cm/s LVOT Vmean:        114.500 cm/s LVOT VTI:          0.334 m LVOT/AV VTI ratio: 0.51 AI PHT:            104 msec  AORTA Ao Root diam: 2.70 cm Ao Asc diam:  3.70 cm MITRAL VALVE MV Area (PHT): 3.91 cm     SHUNTS MV Area VTI:   1.69 cm     Systemic VTI:  0.33 m MV Peak grad:  15.2 mmHg    Systemic Diam: 1.70 cm MV Mean grad:  6.5 mmHg MV Vmax:       1.95 m/s MV Vmean:      122.0 cm/s MV Decel Time: 194 msec MV E velocity: 98.50 cm/s  MV A velocity: 165.00 cm/s MV E/A ratio:  0.60 Darryle Decent MD Electronically signed by Darryle Decent MD Signature Date/Time: 07/30/2024/1:56:17 PM    Final    CT Head Wo Contrast Result Date: 07/29/2024 EXAM: CT HEAD WITHOUT CONTRAST 07/29/2024 02:23:19 PM TECHNIQUE: CT of the head was performed without the administration of intravenous contrast. Automated exposure control, iterative reconstruction, and/or weight based adjustment of the mA/kV was utilized to reduce the radiation dose to as low as reasonably achievable. COMPARISON: 10/20/2022 CLINICAL HISTORY: Mental status change, unknown cause FINDINGS: BRAIN AND VENTRICLES: No acute hemorrhage. No evidence of acute infarct. No hydrocephalus. No extra-axial collection. No mass effect or midline shift. Septum pellucidum is present. Atrophy and white matter changes are mildly advanced for age, stable. Atherosclerotic calcifications are present in the cavernous carotid arteries bilaterally. No hyperdense vessel is present. ORBITS: No acute abnormality. SINUSES: No acute abnormality. SOFT TISSUES AND SKULL: No acute soft tissue abnormality. No skull fracture. IMPRESSION: 1. No acute intracranial abnormality. 2. Mildly advanced atrophy and white matter changes for age, stable. Electronically signed by: Lonni Necessary MD 07/29/2024 02:37 PM EST RP Workstation: [id number]   DG Chest Port 1 View Result Date: 07/29/2024 EXAM: 1 VIEW(S) XRAY OF THE CHEST 07/29/2024 01:19:47 PM COMPARISON: 06/21/2024 CLINICAL HISTORY: Altered mental status. FINDINGS: LINES, TUBES AND DEVICES: Vascular stent in left arm. LUNGS AND PLEURA: Low lung volumes. Increased interstitial markings with differential diagnosis including pulmonary edema versus atypical infection. No pleural effusion. No pneumothorax. HEART AND MEDIASTINUM: Atherosclerotic plaque. No acute abnormality of the cardiac and mediastinal silhouettes. BONES AND SOFT TISSUES: No acute osseous abnormality. IMPRESSION: 1.  Increased interstitial markings, differential diagnosis includes pulmonary edema versus atypical infection. 2. Low lung volumes. Electronically signed by: Waddell Calk MD 07/29/2024 02:04 PM EST RP Workstation: [id number]    Microbiology: Results for orders placed or performed during the hospital encounter of 07/29/24  Blood culture (routine x 2)     Status: None (Preliminary result)   Collection Time: 07/29/24 12:50 PM   Specimen: BLOOD  Result Value Ref Range Status   Specimen Description BLOOD BLOOD RIGHT HAND  Final   Special Requests AEROBIC BOTTLE ONLY Blood Culture adequate volume  Final   Culture   Final    NO GROWTH < 24 HOURS Performed at [facility], [address], [place], [place] [address]    Report Status PENDING  Incomplete  Blood culture (routine x 2)     Status: None (Preliminary result)   Collection Time: 07/29/24  2:00 PM   Specimen: BLOOD RIGHT ARM  Result Value Ref Range Status   Specimen Description BLOOD RIGHT ARM  Final   Special Requests   Final    BOTTLES DRAWN AEROBIC AND ANAEROBIC Blood Culture adequate volume   Culture   Final    NO GROWTH < 24 HOURS Performed at [facility], [address], [place], [place] [address]    Report Status PENDING  Incomplete   *Note: Due to a large number of results and/or encounters for the requested time period, some results have not been displayed. A complete set of results can be found in Results Review.    Labs: CBC: Recent Labs  Lab 07/29/24 1320 07/29/24 1400 07/30/24 0406  WBC  --  6.9 6.5  NEUTROABS  --  4.6  --   HGB 12.2 11.1* 12.1  HCT 36.0 37.7 38.9  MCV  --  91.7 89.4  PLT  --  160 142*  Basic Metabolic Panel: Recent Labs  Lab 07/29/24 1250 07/29/24 1320 07/30/24 0406  NA 136 135 135  K 5.5* 5.1 4.2  CL 93*  --  93*  CO2 25  --  26  GLUCOSE 236*  --  133*  BUN 40*  --  22  CREATININE 6.98*  --  4.38*  CALCIUM  10.1  --  10.0   Liver Function Tests: Recent Labs  Lab  07/29/24 1250 07/30/24 0406  AST 26 23  ALT 27 22  ALKPHOS 270* 273*  BILITOT 0.3 0.3  PROT 6.9 7.0  ALBUMIN  4.1 4.1   CBG: Recent Labs  Lab 07/30/24 0822 07/30/24 1207  GLUCAP 191* 203*    Discharge time spent: greater than 30 minutes.  Signed: Nydia Distance, MD [facility] 07/30/2024 "

## 2024-07-30 NOTE — Progress Notes (Signed)
" °  Echocardiogram 2D Echocardiogram has been performed.  Koleen KANDICE Popper, RDCS 07/30/2024, 12:41 PM "

## 2024-07-31 ENCOUNTER — Observation Stay

## 2024-07-31 DIAGNOSIS — R55 Syncope and collapse: Secondary | ICD-10-CM

## 2024-07-31 DIAGNOSIS — E875 Hyperkalemia: Secondary | ICD-10-CM | POA: Diagnosis not present

## 2024-07-31 DIAGNOSIS — N186 End stage renal disease: Secondary | ICD-10-CM | POA: Diagnosis not present

## 2024-07-31 DIAGNOSIS — R4182 Altered mental status, unspecified: Secondary | ICD-10-CM | POA: Diagnosis not present

## 2024-07-31 DIAGNOSIS — R7989 Other specified abnormal findings of blood chemistry: Secondary | ICD-10-CM | POA: Diagnosis not present

## 2024-07-31 LAB — GLUCOSE, CAPILLARY
Glucose-Capillary: 151 mg/dL — ABNORMAL HIGH (ref 70–99)
Glucose-Capillary: 160 mg/dL — ABNORMAL HIGH (ref 70–99)
Glucose-Capillary: 175 mg/dL — ABNORMAL HIGH (ref 70–99)
Glucose-Capillary: 207 mg/dL — ABNORMAL HIGH (ref 70–99)
Glucose-Capillary: 229 mg/dL — ABNORMAL HIGH (ref 70–99)

## 2024-07-31 LAB — RESP PANEL BY RT-PCR (RSV, FLU A&B, COVID)  RVPGX2
Influenza A by PCR: NEGATIVE
Influenza B by PCR: NEGATIVE
Resp Syncytial Virus by PCR: NEGATIVE
SARS Coronavirus 2 by RT PCR: NEGATIVE

## 2024-07-31 LAB — HEPATITIS B SURFACE ANTIBODY, QUANTITATIVE: Hep B S AB Quant (Post): 73.6 m[IU]/mL

## 2024-07-31 MED ORDER — CARVEDILOL 12.5 MG PO TABS: 12.5000 mg | ORAL_TABLET | Freq: Two times a day (BID) | ORAL | Status: DC

## 2024-07-31 MED ORDER — PREDNISONE 50 MG PO TABS
60.0000 mg | ORAL_TABLET | Freq: Every day | ORAL | Status: AC
  Administered 2024-07-31 – 2024-08-04 (×5): 60 mg via ORAL
  Filled 2024-07-31 (×5): qty 1

## 2024-07-31 MED ORDER — INSULIN ASPART 100 UNIT/ML IJ SOLN
0.0000 [IU] | Freq: Every day | INTRAMUSCULAR | Status: DC
  Administered 2024-07-31: 2 [IU] via SUBCUTANEOUS
  Administered 2024-08-01: 4 [IU] via SUBCUTANEOUS
  Administered 2024-08-02 – 2024-08-03 (×2): 5 [IU] via SUBCUTANEOUS
  Filled 2024-07-31: qty 4
  Filled 2024-07-31: qty 5
  Filled 2024-07-31: qty 2
  Filled 2024-07-31: qty 5

## 2024-07-31 MED ORDER — CARVEDILOL 6.25 MG PO TABS
6.2500 mg | ORAL_TABLET | Freq: Two times a day (BID) | ORAL | Status: DC
  Administered 2024-07-31 – 2024-08-04 (×9): 6.25 mg via ORAL
  Filled 2024-07-31 (×9): qty 1

## 2024-07-31 MED ORDER — INSULIN ASPART 100 UNIT/ML IJ SOLN
0.0000 [IU] | Freq: Three times a day (TID) | INTRAMUSCULAR | Status: DC
  Administered 2024-07-31 – 2024-08-01 (×2): 3 [IU] via SUBCUTANEOUS
  Administered 2024-08-01 (×2): 5 [IU] via SUBCUTANEOUS
  Administered 2024-08-02: 7 [IU] via SUBCUTANEOUS
  Administered 2024-08-02: 3 [IU] via SUBCUTANEOUS
  Administered 2024-08-02: 7 [IU] via SUBCUTANEOUS
  Administered 2024-08-03: 2 [IU] via SUBCUTANEOUS
  Administered 2024-08-03: 7 [IU] via SUBCUTANEOUS
  Administered 2024-08-04: 3 [IU] via SUBCUTANEOUS
  Filled 2024-07-31: qty 5
  Filled 2024-07-31: qty 7
  Filled 2024-07-31: qty 4
  Filled 2024-07-31 (×2): qty 3
  Filled 2024-07-31: qty 6
  Filled 2024-07-31: qty 3
  Filled 2024-07-31: qty 5
  Filled 2024-07-31: qty 3

## 2024-07-31 MED ORDER — CYCLOBENZAPRINE HCL 5 MG PO TABS
5.0000 mg | ORAL_TABLET | Freq: Three times a day (TID) | ORAL | Status: DC
  Administered 2024-07-31 – 2024-08-04 (×10): 5 mg via ORAL
  Filled 2024-07-31 (×10): qty 1

## 2024-07-31 MED ORDER — CHLORHEXIDINE GLUCONATE CLOTH 2 % EX PADS
6.0000 | MEDICATED_PAD | Freq: Every day | CUTANEOUS | Status: DC
  Administered 2024-08-01 – 2024-08-02 (×2): 6 via TOPICAL

## 2024-07-31 NOTE — Progress Notes (Signed)
 "          [name]                                                                              [name], is a 69 y.o. female, DOB - 05-11-1956, FMW:[id number] Admit date - 07/29/2024    Outpatient Primary MD for the patient is [facility]  LOS - 0  days  Chief Complaint  Patient presents with   Altered Mental Status       Brief summary   Patient is a 69 year old female with HTN, HLP, diabetes, neuropathy, CAD, QT prolongation, PAD, GERD, chronic diastolic CHF, ESRD on hemodialysis, depression, asthma, chronic pain, obesity, OSA, COPD presented with altered mental status.  Patient was reportedly at her baseline on the morning of admission including when her daughter got her in the car to take her to the dialysis.  However on arrival at the dialysis center, patient slumped and was less responsive.  EMS was called.  CBG 199 and continued to be slow to respond.  No focal neurological deficits.  Patient was brought to ED. Chest x-Edgin showed increased interstitial markings which could represent edema versus atypical infection. CT head showed no acute abnormality.    Assessment & Plan    Acute toxic encephalopathy likely due to polypharmacy -Patient has been taking opioid pain medications, Lyrica , tizanidine , Atarax , sertraline , concern for polypharmacy specially in a ESRD/dialysis patient.  Patient had taken her morning medications and reportedly began abruptly up on arrival to HD center.   -  As her symptoms improved postdialysis suggest possible polypharmacy. -TSH 0.8, lactic acid mildly elevated 2.4, improved to 1.1.  Procalcitonin 0.72, troponin 216 - Unfortunately, as pain medication regimen was adjusted, patient started having intractable pain and unable to stand up, hence discharge canceled on 9/18. -Patient has been taking oxycodone  scheduled, hence placed back on 10 mg 3 times daily, decrease to Lyrica  75 mg twice daily, continue lidocaine  patch, added Flexeril ,  Cymbalta  30 mg daily, Voltaren  gel -Continue to monitor closely for any acute encephalopathy, respiratory depression or excessive sedation.  Patient needs to follow-up with the pain specialist for pain management outpatient.     ESRD on hemodialysis, TTS Chronic diastolic CHF -Received hemodialysis with improvement in her mental status, suggesting polypharmacy -Continue volume management with HD  Severe bilateral knee pain - Due to severe osteoarthritis, follows orthopedics outpatient, Dr. Kay - Requested orthopedic evaluation, patient has end-stage severe osteoarthritis, cortisone injections is not effective, recommended short-term prednisone  60 mg daily for 5 days - Patient was recommended to follow-up with Dr. Mariea as her A1c is now acceptable at this point and needs TKA.    Elevated troponin possibly demand ischemia CAD No complaints of chest pain, acutely.  There was a concern for possible syncopal event hence cardiology was consulted. Seen by cardiology, Dr Ardelle, recommended 2D echo for further evaluation - Continue ASA, atorvastatin  - 2D echo showed EF 65-70%, moderate AS, has outpatient follow-up scheduled. Cleared by cardiology to discharge home. Event monitor will be mailed to the patient.    Diarrhea - Appears to have resolved -patient reported diarrhea for 1 month however improved while inpatient.  C.  difficile was ordered.   Hypertension - continue coreg , resumed at 6.25 mg twice daily   Hyperlipidemia - Continue home statin   Diabetes mellitus  CBG (last 3)  Recent Labs    07/30/24 2039 07/31/24 0731 07/31/24 1120  GLUCAP 318* 160* 175*   CBGs elevated, now expected to rise with prednisone ,   Neuropathy - Continue Lyrica  75 mg twice daily   PAD - Continue home ASA, atorvastatin    GERD - Continue PPI     OSA - Continue CPAP   Chronic hepatitis C - Noted - LFTs stable   Anemia - Stable at 11.1 - Trend CBC   Obesity class  I Estimated body mass index is 37.9 kg/m as calculated from the following:   Height as of this encounter: 5' 5 (1.651 m).   Weight as of this encounter: 103.3 kg.  Code Status: Full code DVT Prophylaxis:  heparin  injection 5,000 Units Start: 07/29/24 2200   Level of Care: Level of care: Telemetry Family Communication: Updated patient Disposition Plan:      Remains inpatient appropriate: Working with PT today and hopefully discharge home with home PT declines SNF   Procedures:    Consultants:   Orthopedics Nephrology  Antimicrobials:   Anti-infectives (From admission, onward)    Start     Dose/Rate Route Frequency Ordered Stop   07/29/24 1345  ceFEPIme  (MAXIPIME ) 2 g in sodium chloride  0.9 % 100 mL IVPB        2 g 200 mL/hr over 30 Minutes Intravenous  Once 07/29/24 1333 07/29/24 1434   07/29/24 1345  metroNIDAZOLE  (FLAGYL ) IVPB 500 mg        500 mg 100 mL/hr over 60 Minutes Intravenous  Once 07/29/24 1333 07/30/24 1559   07/29/24 1345  vancomycin  (VANCOCIN ) IVPB 1000 mg/200 mL premix  Status:  Discontinued        1,000 mg 200 mL/hr over 60 Minutes Intravenous  Once 07/29/24 1333 07/29/24 1342   07/29/24 1345  vancomycin  (VANCOREADY) IVPB 2000 mg/400 mL        2,000 mg 200 mL/hr over 120 Minutes Intravenous  Once 07/29/24 1342 07/30/24 1523          Medications  aspirin  EC  81 mg Oral Daily   atorvastatin   80 mg Oral QHS   Chlorhexidine  Gluconate Cloth  6 each Topical Q0600   diclofenac  Sodium  4 g Topical QID   DULoxetine   30 mg Oral Daily   heparin   2,500 Units Dialysis Once in dialysis   heparin   5,000 Units Subcutaneous Q8H   insulin  aspart  0-6 Units Subcutaneous TID WC   lidocaine   2 patch Transdermal Daily   oxyCODONE   10 mg Oral TID   pantoprazole   40 mg Oral Daily   predniSONE   60 mg Oral QAC breakfast   pregabalin   75 mg Oral BID   sodium chloride  flush  3 mL Intravenous Q12H      Subjective:   [name] was seen and examined today.   Continues to complain of severe knee pain, wants to work with PT today does not want to go to SNF.  States she wants to stay today.  Patient denies dizziness, chest pain, shortness of breath, abdominal pain, N/V.   Objective:   Vitals:   07/30/24 0519 07/30/24 2036 07/31/24 0536 07/31/24 0730  BP: (!) 125/44 (!) 152/65 138/75 (!) 151/67  Pulse: 78 86 83 82  Resp: 16 17 17 18   Temp: 98.4 F (36.9 C) 98.6  F (37 C) 97.8 F (36.6 C) 97.6 F (36.4 C)  TempSrc: Oral Oral Oral   SpO2: 95% 94% 95% 94%  Weight:      Height:        Intake/Output Summary (Last 24 hours) at 07/31/2024 1216 Last data filed at 07/30/2024 1618 Gross per 24 hour  Intake 500.1 ml  Output --  Net 500.1 ml     Wt Readings from Last 3 Encounters:  07/29/24 103.3 kg  06/21/24 104 kg  06/01/24 104.3 kg    Physical Exam General: Alert and oriented x 3, NAD Cardiovascular: S1 S2 clear, RRR.  Respiratory: CTAB, no wheezing Gastrointestinal: Soft, nontender, nondistended, NBS Ext: no pedal edema bilaterally Neuro: no new deficits Psych: Uncomfortable with knee pain    Data Reviewed:  I have personally reviewed following labs    CBC Lab Results  Component Value Date   WBC 6.5 07/30/2024   RBC 4.35 07/30/2024   HGB 12.1 07/30/2024   HCT 38.9 07/30/2024   MCV 89.4 07/30/2024   MCH 27.8 07/30/2024   PLT 142 (L) 07/30/2024   MCHC 31.1 07/30/2024   RDW 16.4 (H) 07/30/2024   LYMPHSABS 1.5 07/29/2024   MONOABS 0.6 07/29/2024   EOSABS 0.2 07/29/2024   BASOSABS 0.0 07/29/2024     Last metabolic panel Lab Results  Component Value Date   NA 135 07/30/2024   K 4.2 07/30/2024   CL 93 (L) 07/30/2024   CO2 26 07/30/2024   BUN 22 07/30/2024   CREATININE 4.38 (H) 07/30/2024   GLUCOSE 133 (H) 07/30/2024   GFRNONAA 11 (L) 07/30/2024   GFRAA 6 (L) 01/31/2020   CALCIUM  10.0 07/30/2024   PHOS 4.2 06/02/2024   PROT 7.0 07/30/2024   ALBUMIN  4.1 07/30/2024   LABGLOB 2.8 09/27/2020   AGRATIO 1.4  09/27/2020   BILITOT 0.3 07/30/2024   ALKPHOS 273 (H) 07/30/2024   AST 23 07/30/2024   ALT 22 07/30/2024   ANIONGAP 16 (H) 07/30/2024    CBG (last 3)  Recent Labs    07/30/24 2039 07/31/24 0731 07/31/24 1120  GLUCAP 318* 160* 175*      Coagulation Profile: No results for input(s): INR, PROTIME in the last 168 hours.   Radiology Studies: I have personally reviewed the imaging studies  ECHOCARDIOGRAM COMPLETE Result Date: 07/30/2024    ECHOCARDIOGRAM REPORT   Patient Name:   MADELEINE FENN Date of Exam: 07/30/2024 Medical Rec #:  [id number]     Height:       65.0 in Accession #:    [id number]    Weight:       227.7 lb Date of Birth:  17-Mar-1956     BSA:          2.090 m Patient Age:    68 years      BP:           125/44 mmHg Patient Gender: F             HR:           86 bpm. Exam Location:  Inpatient Procedure: 2D Echo, Cardiac Doppler, Color Doppler and PEDOF (Both Spectral and            Color Flow Doppler were utilized during procedure). Indications:    Aortic Stenosis I35.0  History:        Patient has prior history of Echocardiogram examinations, most  recent 03/04/2024. CHF, CAD and Previous Myocardial Infarction,                 COPD, Aortic Valve Disease; Risk Factors:Dyslipidemia,                 Hypertension, Former Smoker, Diabetes, Sleep Apnea and ESRD,                 GERD.  Sonographer:    Koleen Popper RDCS Referring Phys: WADDELL DELENA DONATH  Sonographer Comments: Image acquisition challenging due to patient body habitus. IMPRESSIONS  1. Left ventricular ejection fraction, by estimation, is 65 to 70%. The left ventricle has normal function. The left ventricle has no regional wall motion abnormalities. Left ventricular diastolic parameters are consistent with Grade I diastolic dysfunction (impaired relaxation).  2. Right ventricular systolic function is normal. The right ventricular size is normal. Tricuspid regurgitation signal is inadequate for assessing PA  pressure.  3. The mitral valve is degenerative. Trivial mitral valve regurgitation. Mild to moderate mitral stenosis. The mean mitral valve gradient is 6.5 mmHg with average heart rate of 86 bpm. Severe mitral annular calcification.  4. The aortic valve was not well visualized. Aortic valve regurgitation is mild. Moderate aortic valve stenosis. Aortic valve area, by VTI measures 1.16 cm. Aortic valve mean gradient measures 23.0 mmHg. Aortic valve Vmax measures 3.20 m/s.  5. The inferior vena cava is normal in size with greater than 50% respiratory variability, suggesting right atrial pressure of 3 mmHg. Comparison(s): No significant change from prior study. FINDINGS  Left Ventricle: Left ventricular ejection fraction, by estimation, is 65 to 70%. The left ventricle has normal function. The left ventricle has no regional wall motion abnormalities. The left ventricular internal cavity size was normal in size. There is  no left ventricular hypertrophy. Left ventricular diastolic parameters are consistent with Grade I diastolic dysfunction (impaired relaxation). Right Ventricle: The right ventricular size is normal. No increase in right ventricular wall thickness. Right ventricular systolic function is normal. Tricuspid regurgitation signal is inadequate for assessing PA pressure. Left Atrium: Left atrial size was normal in size. Right Atrium: Right atrial size was normal in size. Pericardium: There is no evidence of pericardial effusion. Mitral Valve: The mitral valve is degenerative in appearance. Severe mitral annular calcification. Trivial mitral valve regurgitation. Mild to moderate mitral valve stenosis. MV peak gradient, 15.2 mmHg. The mean mitral valve gradient is 6.5 mmHg with average heart rate of 86 bpm. Tricuspid Valve: The tricuspid valve is grossly normal. Tricuspid valve regurgitation is trivial. No evidence of tricuspid stenosis. Aortic Valve: The aortic valve was not well visualized. Aortic valve  regurgitation is mild. Aortic regurgitation PHT measures 104 msec. Moderate aortic stenosis is present. Aortic valve mean gradient measures 23.0 mmHg. Aortic valve peak gradient measures 41.0 mmHg. Aortic valve area, by VTI measures 1.16 cm. Pulmonic Valve: The pulmonic valve was grossly normal. Pulmonic valve regurgitation is not visualized. No evidence of pulmonic stenosis. Aorta: The aortic root and ascending aorta are structurally normal, with no evidence of dilitation. Venous: The inferior vena cava is normal in size with greater than 50% respiratory variability, suggesting right atrial pressure of 3 mmHg. IAS/Shunts: The atrial septum is grossly normal.  LEFT VENTRICLE PLAX 2D LVIDd:         5.00 cm      Diastology LVIDs:         3.50 cm      LV e' medial:    6.64 cm/s LV PW:  1.00 cm      LV E/e' medial:  14.8 LV IVS:        0.90 cm      LV e' lateral:   6.20 cm/s LVOT diam:     1.70 cm      LV E/e' lateral: 15.9 LV SV:         76 LV SV Index:   36 LVOT Area:     2.27 cm  LV Volumes (MOD) LV vol d, MOD A4C: 120.0 ml LV vol s, MOD A4C: 56.9 ml LV SV MOD A4C:     120.0 ml RIGHT VENTRICLE             IVC RV S prime:     10.80 cm/s  IVC diam: 1.30 cm TAPSE (M-mode): 2.1 cm LEFT ATRIUM             Index LA diam:        4.20 cm 2.01 cm/m LA Vol (A2C):   51.8 ml 24.78 ml/m LA Vol (A4C):   54.9 ml 26.26 ml/m LA Biplane Vol: 53.9 ml 25.78 ml/m  AORTIC VALVE AV Area (Vmax):    1.18 cm AV Area (Vmean):   1.14 cm AV Area (VTI):     1.16 cm AV Vmax:           320.00 cm/s AV Vmean:          227.000 cm/s AV VTI:            0.651 m AV Peak Grad:      41.0 mmHg AV Mean Grad:      23.0 mmHg LVOT Vmax:         166.50 cm/s LVOT Vmean:        114.500 cm/s LVOT VTI:          0.334 m LVOT/AV VTI ratio: 0.51 AI PHT:            104 msec  AORTA Ao Root diam: 2.70 cm Ao Asc diam:  3.70 cm MITRAL VALVE MV Area (PHT): 3.91 cm     SHUNTS MV Area VTI:   1.69 cm     Systemic VTI:  0.33 m MV Peak grad:  15.2 mmHg     Systemic Diam: 1.70 cm MV Mean grad:  6.5 mmHg MV Vmax:       1.95 m/s MV Vmean:      122.0 cm/s MV Decel Time: 194 msec MV E velocity: 98.50 cm/s MV A velocity: 165.00 cm/s MV E/A ratio:  0.60 Darryle Decent MD Electronically signed by Darryle Decent MD Signature Date/Time: 07/30/2024/1:56:17 PM    Final    CT Head Wo Contrast Result Date: 07/29/2024 EXAM: CT HEAD WITHOUT CONTRAST 07/29/2024 02:23:19 PM TECHNIQUE: CT of the head was performed without the administration of intravenous contrast. Automated exposure control, iterative reconstruction, and/or weight based adjustment of the mA/kV was utilized to reduce the radiation dose to as low as reasonably achievable. COMPARISON: 10/20/2022 CLINICAL HISTORY: Mental status change, unknown cause FINDINGS: BRAIN AND VENTRICLES: No acute hemorrhage. No evidence of acute infarct. No hydrocephalus. No extra-axial collection. No mass effect or midline shift. Septum pellucidum is present. Atrophy and white matter changes are mildly advanced for age, stable. Atherosclerotic calcifications are present in the cavernous carotid arteries bilaterally. No hyperdense vessel is present. ORBITS: No acute abnormality. SINUSES: No acute abnormality. SOFT TISSUES AND SKULL: No acute soft tissue abnormality. No skull fracture. IMPRESSION: 1. No acute intracranial abnormality. 2. Mildly advanced atrophy  and white matter changes for age, stable. Electronically signed by: Lonni Necessary MD 07/29/2024 02:37 PM EST RP Workstation: [id number]   DG Chest Port 1 View Result Date: 07/29/2024 EXAM: 1 VIEW(S) XRAY OF THE CHEST 07/29/2024 01:19:47 PM COMPARISON: 06/21/2024 CLINICAL HISTORY: Altered mental status. FINDINGS: LINES, TUBES AND DEVICES: Vascular stent in left arm. LUNGS AND PLEURA: Low lung volumes. Increased interstitial markings with differential diagnosis including pulmonary edema versus atypical infection. No pleural effusion. No pneumothorax. HEART AND MEDIASTINUM:  Atherosclerotic plaque. No acute abnormality of the cardiac and mediastinal silhouettes. BONES AND SOFT TISSUES: No acute osseous abnormality. IMPRESSION: 1. Increased interstitial markings, differential diagnosis includes pulmonary edema versus atypical infection. 2. Low lung volumes. Electronically signed by: Waddell Calk MD 07/29/2024 02:04 PM EST RP Workstation: [id number]       Nydia Distance M.D. [name] 07/31/2024, 12:16 PM  Available via Epic secure chat 7am-7pm After 7 pm, please refer to night coverage provider listed on amion.    "

## 2024-07-31 NOTE — Progress Notes (Addendum)
 " [facility] Progress Note   Subjective:    Seen and examined patient at bedside. She's about to work with PT. Denies acute issues. Next HD 1/20.  Objective Vitals:   07/30/24 0519 07/30/24 2036 07/31/24 0536 07/31/24 0730  BP: (!) 125/44 (!) 152/65 138/75 (!) 151/67  Pulse: 78 86 83 82  Resp: 16 17 17 18   Temp: 98.4 F (36.9 C) 98.6 F (37 C) 97.8 F (36.6 C) 97.6 F (36.4 C)  TempSrc: Oral Oral Oral   SpO2: 95% 94% 95% 94%  Weight:      Height:       Physical Exam General: Alert female in NAD Heart: RRR, no murmur Lungs: CTA bilaterally, respirations unlabored Abdomen: Soft and non-distended  Extremities: trace edema b/l lower extremities Dialysis Access:  AVF + t/b  Filed Weights   07/29/24 1600 07/29/24 2004 07/29/24 2117  Weight: 104.1 kg 101.4 kg 103.3 kg    Intake/Output Summary (Last 24 hours) at 07/31/2024 1205 Last data filed at 07/30/2024 1618 Gross per 24 hour  Intake 500.1 ml  Output --  Net 500.1 ml    Additional Objective Labs: Basic Metabolic Panel: Recent Labs  Lab 07/29/24 1250 07/29/24 1320 07/30/24 0406  NA 136 135 135  K 5.5* 5.1 4.2  CL 93*  --  93*  CO2 25  --  26  GLUCOSE 236*  --  133*  BUN 40*  --  22  CREATININE 6.98*  --  4.38*  CALCIUM  10.1  --  10.0   Liver Function Tests: Recent Labs  Lab 07/29/24 1250 07/30/24 0406  AST 26 23  ALT 27 22  ALKPHOS 270* 273*  BILITOT 0.3 0.3  PROT 6.9 7.0  ALBUMIN  4.1 4.1   No results for input(s): LIPASE, AMYLASE in the last 168 hours. CBC: Recent Labs  Lab 07/29/24 1320 07/29/24 1400 07/30/24 0406  WBC  --  6.9 6.5  NEUTROABS  --  4.6  --   HGB 12.2 11.1* 12.1  HCT 36.0 37.7 38.9  MCV  --  91.7 89.4  PLT  --  160 142*   Blood Culture    Component Value Date/Time   SDES BLOOD RIGHT ARM 07/29/2024 1400   SPECREQUEST  07/29/2024 1400    BOTTLES DRAWN AEROBIC AND ANAEROBIC Blood Culture adequate volume   CULT  07/29/2024 1400    NO GROWTH 2  DAYS Performed at [facility], [address] [place], [place] [address]    REPTSTATUS PENDING 07/29/2024 1400    Cardiac Enzymes: No results for input(s): CKTOTAL, CKMB, CKMBINDEX, TROPONINI in the last 168 hours. CBG: Recent Labs  Lab 07/30/24 1749 07/30/24 2037 07/30/24 2039 07/31/24 0731 07/31/24 1120  GLUCAP 227* 402* 318* 160* 175*   Iron  Studies: No results for input(s): IRON , TIBC, TRANSFERRIN, FERRITIN in the last 72 hours. Lab Results  Component Value Date   INR 1.1 06/01/2024   INR 1.1 04/14/2022   INR 1.1 09/17/2020   Studies/Results: ECHOCARDIOGRAM COMPLETE Result Date: 07/30/2024    ECHOCARDIOGRAM REPORT   Patient Name:   CRYSTALL DONALDSON Date of Exam: 07/30/2024 Medical Rec #:  [id number]     Height:       65.0 in Accession #:    [id number]    Weight:       227.7 lb Date of Birth:  11/21/1955     BSA:          2.090 m Patient Age:  68 years      BP:           125/44 mmHg Patient Gender: F             HR:           86 bpm. Exam Location:  Inpatient Procedure: 2D Echo, Cardiac Doppler, Color Doppler and PEDOF (Both Spectral and            Color Flow Doppler were utilized during procedure). Indications:    Aortic Stenosis I35.0  History:        Patient has prior history of Echocardiogram examinations, most                 recent 03/04/2024. CHF, CAD and Previous Myocardial Infarction,                 COPD, Aortic Valve Disease; Risk Factors:Dyslipidemia,                 Hypertension, Former Smoker, Diabetes, Sleep Apnea and ESRD,                 GERD.  Sonographer:    Koleen Popper RDCS Referring Phys: WADDELL DELENA DONATH  Sonographer Comments: Image acquisition challenging due to patient body habitus. IMPRESSIONS  1. Left ventricular ejection fraction, by estimation, is 65 to 70%. The left ventricle has normal function. The left ventricle has no regional wall motion abnormalities. Left ventricular diastolic parameters are consistent with Grade I diastolic  dysfunction (impaired relaxation).  2. Right ventricular systolic function is normal. The right ventricular size is normal. Tricuspid regurgitation signal is inadequate for assessing PA pressure.  3. The mitral valve is degenerative. Trivial mitral valve regurgitation. Mild to moderate mitral stenosis. The mean mitral valve gradient is 6.5 mmHg with average heart rate of 86 bpm. Severe mitral annular calcification.  4. The aortic valve was not well visualized. Aortic valve regurgitation is mild. Moderate aortic valve stenosis. Aortic valve area, by VTI measures 1.16 cm. Aortic valve mean gradient measures 23.0 mmHg. Aortic valve Vmax measures 3.20 m/s.  5. The inferior vena cava is normal in size with greater than 50% respiratory variability, suggesting right atrial pressure of 3 mmHg. Comparison(s): No significant change from prior study. FINDINGS  Left Ventricle: Left ventricular ejection fraction, by estimation, is 65 to 70%. The left ventricle has normal function. The left ventricle has no regional wall motion abnormalities. The left ventricular internal cavity size was normal in size. There is  no left ventricular hypertrophy. Left ventricular diastolic parameters are consistent with Grade I diastolic dysfunction (impaired relaxation). Right Ventricle: The right ventricular size is normal. No increase in right ventricular wall thickness. Right ventricular systolic function is normal. Tricuspid regurgitation signal is inadequate for assessing PA pressure. Left Atrium: Left atrial size was normal in size. Right Atrium: Right atrial size was normal in size. Pericardium: There is no evidence of pericardial effusion. Mitral Valve: The mitral valve is degenerative in appearance. Severe mitral annular calcification. Trivial mitral valve regurgitation. Mild to moderate mitral valve stenosis. MV peak gradient, 15.2 mmHg. The mean mitral valve gradient is 6.5 mmHg with average heart rate of 86 bpm. Tricuspid Valve: The  tricuspid valve is grossly normal. Tricuspid valve regurgitation is trivial. No evidence of tricuspid stenosis. Aortic Valve: The aortic valve was not well visualized. Aortic valve regurgitation is mild. Aortic regurgitation PHT measures 104 msec. Moderate aortic stenosis is present. Aortic valve mean gradient measures 23.0 mmHg. Aortic valve peak gradient measures  41.0 mmHg. Aortic valve area, by VTI measures 1.16 cm. Pulmonic Valve: The pulmonic valve was grossly normal. Pulmonic valve regurgitation is not visualized. No evidence of pulmonic stenosis. Aorta: The aortic root and ascending aorta are structurally normal, with no evidence of dilitation. Venous: The inferior vena cava is normal in size with greater than 50% respiratory variability, suggesting right atrial pressure of 3 mmHg. IAS/Shunts: The atrial septum is grossly normal.  LEFT VENTRICLE PLAX 2D LVIDd:         5.00 cm      Diastology LVIDs:         3.50 cm      LV e' medial:    6.64 cm/s LV PW:         1.00 cm      LV E/e' medial:  14.8 LV IVS:        0.90 cm      LV e' lateral:   6.20 cm/s LVOT diam:     1.70 cm      LV E/e' lateral: 15.9 LV SV:         76 LV SV Index:   36 LVOT Area:     2.27 cm  LV Volumes (MOD) LV vol d, MOD A4C: 120.0 ml LV vol s, MOD A4C: 56.9 ml LV SV MOD A4C:     120.0 ml RIGHT VENTRICLE             IVC RV S prime:     10.80 cm/s  IVC diam: 1.30 cm TAPSE (M-mode): 2.1 cm LEFT ATRIUM             Index LA diam:        4.20 cm 2.01 cm/m LA Vol (A2C):   51.8 ml 24.78 ml/m LA Vol (A4C):   54.9 ml 26.26 ml/m LA Biplane Vol: 53.9 ml 25.78 ml/m  AORTIC VALVE AV Area (Vmax):    1.18 cm AV Area (Vmean):   1.14 cm AV Area (VTI):     1.16 cm AV Vmax:           320.00 cm/s AV Vmean:          227.000 cm/s AV VTI:            0.651 m AV Peak Grad:      41.0 mmHg AV Mean Grad:      23.0 mmHg LVOT Vmax:         166.50 cm/s LVOT Vmean:        114.500 cm/s LVOT VTI:          0.334 m LVOT/AV VTI ratio: 0.51 AI PHT:            104 msec   AORTA Ao Root diam: 2.70 cm Ao Asc diam:  3.70 cm MITRAL VALVE MV Area (PHT): 3.91 cm     SHUNTS MV Area VTI:   1.69 cm     Systemic VTI:  0.33 m MV Peak grad:  15.2 mmHg    Systemic Diam: 1.70 cm MV Mean grad:  6.5 mmHg MV Vmax:       1.95 m/s MV Vmean:      122.0 cm/s MV Decel Time: 194 msec MV E velocity: 98.50 cm/s MV A velocity: 165.00 cm/s MV E/A ratio:  0.60 Darryle Decent MD Electronically signed by Darryle Decent MD Signature Date/Time: 07/30/2024/1:56:17 PM    Final    CT Head Wo Contrast Result Date: 07/29/2024 EXAM: CT HEAD WITHOUT CONTRAST 07/29/2024 02:23:19 PM TECHNIQUE:  CT of the head was performed without the administration of intravenous contrast. Automated exposure control, iterative reconstruction, and/or weight based adjustment of the mA/kV was utilized to reduce the radiation dose to as low as reasonably achievable. COMPARISON: 10/20/2022 CLINICAL HISTORY: Mental status change, unknown cause FINDINGS: BRAIN AND VENTRICLES: No acute hemorrhage. No evidence of acute infarct. No hydrocephalus. No extra-axial collection. No mass effect or midline shift. Septum pellucidum is present. Atrophy and white matter changes are mildly advanced for age, stable. Atherosclerotic calcifications are present in the cavernous carotid arteries bilaterally. No hyperdense vessel is present. ORBITS: No acute abnormality. SINUSES: No acute abnormality. SOFT TISSUES AND SKULL: No acute soft tissue abnormality. No skull fracture. IMPRESSION: 1. No acute intracranial abnormality. 2. Mildly advanced atrophy and white matter changes for age, stable. Electronically signed by: Lonni Necessary MD 07/29/2024 02:37 PM EST RP Workstation: [id number]   DG Chest Port 1 View Result Date: 07/29/2024 EXAM: 1 VIEW(S) XRAY OF THE CHEST 07/29/2024 01:19:47 PM COMPARISON: 06/21/2024 CLINICAL HISTORY: Altered mental status. FINDINGS: LINES, TUBES AND DEVICES: Vascular stent in left arm. LUNGS AND PLEURA: Low lung volumes.  Increased interstitial markings with differential diagnosis including pulmonary edema versus atypical infection. No pleural effusion. No pneumothorax. HEART AND MEDIASTINUM: Atherosclerotic plaque. No acute abnormality of the cardiac and mediastinal silhouettes. BONES AND SOFT TISSUES: No acute osseous abnormality. IMPRESSION: 1. Increased interstitial markings, differential diagnosis includes pulmonary edema versus atypical infection. 2. Low lung volumes. Electronically signed by: Taylor Stroud MD 07/29/2024 02:04 PM EST RP Workstation: [id number]    Medications:  anticoagulant sodium citrate       aspirin  EC  81 mg Oral Daily   atorvastatin   80 mg Oral QHS   Chlorhexidine  Gluconate Cloth  6 each Topical Q0600   diclofenac  Sodium  4 g Topical QID   DULoxetine   30 mg Oral Daily   heparin   2,500 Units Dialysis Once in dialysis   heparin   5,000 Units Subcutaneous Q8H   insulin  aspart  0-6 Units Subcutaneous TID WC   lidocaine   2 patch Transdermal Daily   oxyCODONE   10 mg Oral TID   pantoprazole   40 mg Oral Daily   predniSONE   60 mg Oral QAC breakfast   pregabalin   75 mg Oral BID   sodium chloride  flush  3 mL Intravenous Q12H    Dialysis Orders: Center: [facility]  on TTS . 180NRe Time: 4 hr 30 min BFR 450 DFR A 1.5 EDW 102.5kg 2K 2.5Ca AVF 15g Heparin  3000 unit bolus  Mircera 225mcg IV q 2 weeks Hectorol  8mcg IV q HD Sensipar  30mg  PO q HD  Assessment/Plan: AMS: Reportedly began abruptly upon arrival to HD. Work up ongoing per primary team. Does not appear to be uremic based on labs. Does report taking lyrica  and a muscle relaxer pre HD, improvement post dialysis suggests possible polypharmacy  ESRD:  On TTS schedule. Next HD 1/20 Hypertension/volume: Mild volume overload, continue to titrate down volume. Now under EDW here. Lower EDW at discharge. Anemia: Hgb 12.1. No ESA indicated Metabolic bone disease: Calcium  slightly elevated, no VDRA for now. Follow phos Nutrition:  Currently  NPO Diarrhea: reports ongoing for 1 month, per primary team  Charmaine Piety, NP [facility] 07/31/2024,12:05 PM  LOS: 0 days    "

## 2024-07-31 NOTE — TOC Progression Note (Signed)
 Transition of Care [facility]) - Progression Note    Patient Details  Name: [name] MRN: [id number] Date of Birth: 03/06/1956  Transition of Care [facility]) CM/SW Contact  Lendia Dais, [place] Phone Number: 07/31/2024, 2:07 PM  Clinical Narrative: CSW spoke to the pt via phone and informed them of PT recs of SNF.   Pt stated they were agreeable to SNF but did not want to return to [facility] and had a preference for a private room. CSW stated they can send out referrals to other facilities and that they cannot guarantee a private room but can inquire when they choose a facility.   CSW sent referrals out in the HUB. Bed offers pending.  CSW will continue to monitor.                         Expected Discharge Plan and Services         Expected Discharge Date: 07/30/24                                     Social Drivers of Health (SDOH) Interventions SDOH Screenings   Food Insecurity: No Food Insecurity (07/29/2024)  Housing: Low Risk (07/29/2024)  Transportation Needs: No Transportation Needs (07/29/2024)  Utilities: Not At Risk (07/29/2024)  Financial Resource Strain: Low Risk (12/01/2023)   Received from [facility]  Physical Activity: Inactive (12/01/2023)   Received from [facility]  Social Connections: Moderately Integrated (07/29/2024)  Stress: No Stress Concern Present (12/01/2023)   Received from [facility]  Tobacco Use: Medium Risk (07/30/2024)  Health Literacy: Adequate Health Literacy (12/01/2023)   [facility]    Readmission Risk Interventions    03/07/2024    4:29 PM 11/01/2022    9:36 AM  Readmission Risk Prevention Plan  Transportation Screening Complete Complete  PCP or Specialist Appt within 3-5 Days  Complete  HRI or Home Care Consult Complete Complete  Social Work Consult for Recovery Care Planning/Counseling Complete Complete  Palliative Care  Screening Not Applicable Not Applicable  Medication Review Oceanographer) Complete Complete

## 2024-07-31 NOTE — Progress Notes (Signed)
" °   07/31/24 2036  BiPAP/CPAP/SIPAP  BiPAP/CPAP/SIPAP Pt Type Adult  Reason BIPAP/CPAP not in use Non-compliant (pt says they do not wear cpap)    "

## 2024-07-31 NOTE — NC FL2 (Signed)
 " [place]  MEDICAID FL2 LEVEL OF CARE FORM     IDENTIFICATION  Patient Name: [name] Birthdate: 1956/05/21 Sex: female Admission Date (Current Location): 07/29/2024  [facility] and [place] Number:  Producer, Television/film/video and Address:  [facility], [address], [place], [place] [address]      Provider Number: [id number]  Attending Physician Name and Address:  Davia Nydia POUR, MD  Relative Name and Phone Number:  Kokesh,Jazzslyn  Daughter, Emergency Contact  [phone]    Current Level of Care: Hospital Recommended Level of Care: Skilled Nursing Facility Prior Approval Number:    Date Approved/Denied:   PASRR Number: [id number]  Discharge Plan: SNF    Current Diagnoses: Patient Active Problem List   Diagnosis Date Noted   Acute encephalopathy 07/29/2024   Altered mental state 06/01/2024   Knee pain, right 03/28/2024   Weakness of lower extremity 03/28/2024   Refusal of blood product 03/08/2024   Chronic health problem 03/03/2024   Diabetic foot infection (HCC) 11/14/2022   Pancolitis 10/21/2022   Osteomyelitis of left foot (HCC) 10/21/2022   Cellulitis of left lower extremity 07/23/2022   Type 2 diabetes mellitus with chronic kidney disease, with long-term current use of insulin  (HCC) 07/23/2022   Peripheral neuropathy 07/23/2022   Upper GI bleed 09/17/2020   Prolonged QT interval 09/17/2020   COPD (chronic obstructive pulmonary disease) (HCC) 09/17/2020   Allergy, unspecified, initial encounter 03/29/2020   Anaphylactic shock, unspecified, initial encounter 03/29/2020   Pain in left upper arm 01/18/2020   Cellulitis 01/16/2020   Anemia due to end stage renal disease (HCC) 01/16/2020   ESRD on hemodialysis (HCC) 11/06/2019   Nystagmus 11/06/2019   PVD (peripheral vascular disease) 11/06/2019   Vertigo of central origin 11/06/2019   Weakness    BMI 45.0-49.9, adult (HCC) 10/18/2019   Ascending aortic aneurysm 03/27/2019   Puncture  wound without foreign body of left forearm, initial encounter 01/02/2019   Non-healing wound of upper extremity 12/28/2018   Unspecified open wound of left upper arm, initial encounter 12/06/2018   Complication from renal dialysis device 10/20/2018   Iron  deficiency anemia, unspecified 10/18/2018   ESRD (end stage renal disease) on dialysis (HCC) 06/01/2018   Colon cancer screening    LGSIL on Pap smear of cervix 01/27/2018   AKI (acute kidney injury) 12/24/2017   Chronic diastolic heart failure (HCC) 09/30/2017   Lymphedema 09/30/2017   Aortic stenosis    Acute pain of both knees 02/12/2017   Chronic gout due to renal impairment of multiple sites without tophus 02/12/2017   Esophageal dysphagia 02/12/2017   Osteoarthritis 01/22/2017   CAD (coronary artery disease) 12/13/2016   Hyperlipidemia 12/13/2016   Hyperphosphatemia 12/13/2016   Asthma 11/29/2015   Depression 09/05/2015   GERD (gastroesophageal reflux disease) 03/25/2015   Environmental allergies 03/14/2015   Chronic hepatitis C without hepatic coma (HCC) 01/31/2015   Diabetic neuropathy (HCC) 01/31/2015   OSA (obstructive sleep apnea) 01/01/2015   Poor dentition 11/13/2014   Essential hypertension 10/08/2014   Morbid (severe) obesity due to excess calories (HCC) 10/08/2014   Localized, primary osteoarthritis 01/25/2014   Family history of diabetes mellitus 03/31/2013   Pain in limb 03/31/2013    Orientation RESPIRATION BLADDER Height & Weight     Self, Time, Situation, Place  Normal  (no data input) Weight: 227 lb 11.8 oz (103.3 kg) Height:  5' 5 (165.1 cm)  BEHAVIORAL SYMPTOMS/MOOD NEUROLOGICAL BOWEL NUTRITION STATUS       (  no data input) Diet (see dc summary)  AMBULATORY STATUS COMMUNICATION OF NEEDS Skin   Extensive Assist Verbally                         Personal Care Assistance Level of Assistance  Bathing, Dressing, Feeding Bathing Assistance: Maximum assistance Feeding assistance:  Independent Dressing Assistance: Maximum assistance     Functional Limitations Info  Sight, Hearing, Speech Sight Info: Adequate Hearing Info: Adequate Speech Info: Adequate    SPECIAL CARE FACTORS FREQUENCY  PT (By licensed PT), OT (By licensed OT)     PT Frequency: 5x/wk OT Frequency: 5x/wk            Contractures      Additional Factors Info  Code Status, Allergies Code Status Info: FULL Allergies Info: Shellfish Allergy  Diazepam   Morphine            Current Medications (07/31/2024):  This is the current hospital active medication list Current Facility-Administered Medications  Medication Dose Route Frequency Provider Last Rate Last Admin   acetaminophen  (TYLENOL ) tablet 650 mg  650 mg Oral Q6H PRN Seena Marsa NOVAK, MD   650 mg at 07/29/24 2237   Or   acetaminophen  (TYLENOL ) suppository 650 mg  650 mg Rectal Q6H PRN Seena Marsa NOVAK, MD       albuterol  (PROVENTIL ) (2.5 MG/3ML) 0.083% nebulizer solution 3 mL  3 mL Inhalation Q4H PRN Seena Marsa NOVAK, MD       anticoagulant sodium citrate  solution 5 mL  5 mL Intracatheter PRN Seena Marsa NOVAK, MD       aspirin  EC tablet 81 mg  81 mg Oral Daily Melvin, Alexander B, MD   81 mg at 07/31/24 9197   atorvastatin  (LIPITOR ) tablet 80 mg  80 mg Oral QHS Melvin, Alexander B, MD   80 mg at 07/30/24 2157   carvedilol  (COREG ) tablet 6.25 mg  6.25 mg Oral BID Rai, Ripudeep K, MD       [START ON 08/01/2024] Chlorhexidine  Gluconate Cloth 2 % PADS 6 each  6 each Topical Q0600 Anderson, Courtney E, NP       cyclobenzaprine  (FLEXERIL ) tablet 5 mg  5 mg Oral TID Rai, Ripudeep K, MD       diclofenac  Sodium (VOLTAREN ) 1 % topical gel 4 g  4 g Topical QID Opyd, Timothy S, MD   4 g at 07/31/24 1000   DULoxetine  (CYMBALTA ) DR capsule 30 mg  30 mg Oral Daily Rai, Ripudeep K, MD   30 mg at 07/31/24 0802   feeding supplement (NEPRO CARB STEADY) liquid 237 mL  237 mL Oral PRN Seena Marsa NOVAK, MD       heparin  injection 2,500 Units   2,500 Units Dialysis Once in dialysis Seena Marsa NOVAK, MD       heparin  injection 5,000 Units  5,000 Units Subcutaneous Q8H Melvin, Alexander B, MD   5,000 Units at 07/31/24 0518   HYDROmorphone  (DILAUDID ) injection 0.5 mg  0.5 mg Intravenous Q4H PRN Rai, Ripudeep K, MD   0.5 mg at 07/31/24 0354   insulin  aspart (novoLOG ) injection 0-5 Units  0-5 Units Subcutaneous QHS Rai, Ripudeep K, MD       insulin  aspart (novoLOG ) injection 0-9 Units  0-9 Units Subcutaneous TID WC Rai, Ripudeep K, MD       lidocaine  (LIDODERM ) 5 % 2 patch  2 patch Transdermal Daily Opyd, Timothy S, MD   2 patch at 07/30/24 1011   oxyCODONE  (  Oxy IR/ROXICODONE ) immediate release tablet 10 mg  10 mg Oral TID Rai, Ripudeep K, MD   10 mg at 07/31/24 0802   pantoprazole  (PROTONIX ) EC tablet 40 mg  40 mg Oral Daily Melvin, Alexander B, MD   40 mg at 07/31/24 0802   polyethylene glycol (MIRALAX  / GLYCOLAX ) packet 17 g  17 g Oral Daily PRN Melvin, Alexander B, MD       predniSONE  (DELTASONE ) tablet 60 mg  60 mg Oral QAC breakfast Rai, Ripudeep K, MD   60 mg at 07/31/24 1201   pregabalin  (LYRICA ) capsule 75 mg  75 mg Oral BID Rai, Ripudeep K, MD   75 mg at 07/31/24 0802   sodium chloride  flush (NS) 0.9 % injection 3 mL  3 mL Intravenous Q12H Seena Marsa NOVAK, MD   3 mL at 07/31/24 1000     Discharge Medications: Please see discharge summary for a list of discharge medications.  Relevant Imaging Results:  Relevant Lab Results:   Additional Information SS# [id number]  Lendia Dais, LCSWA     "

## 2024-07-31 NOTE — Progress Notes (Signed)
 Physical Therapy Treatment Patient Details Name: [name] MRN: [id number] DOB: [birth year] Today's Date: 07/31/2024   History of Present Illness The pt is a 69 yo female presenting 1/17 after pt with AMS and fall out of her car (landing on knees) at HD. Pt hypotensive and with pinpoint pupils with EMS. Admitted for management of acute encephalopathy due to polypharmacy. PMH includes: arthritis (bilateral knees), ESRD on HD, CAD, CHF, DM II, HTN, MI, obesity (BMI 37).    PT Comments  Pt admitted with above diagnosis. Pt was able to stand to Stedy x 4 x with max assist of 2 progressing to min assist of 2 to stand once in [place] (easier to stand from buttock pads). Pt continues to have pain bil knees but is progressing slowly. Pt fearful to stand without more support so Stedy was a good option to get pt up on her feet. Pt should progress well and would benefit from post acute rehab < 3 hours day. Pt agreeable.   Pt currently with functional limitations due to the deficits listed below (see PT Problem List). Pt will benefit from acute skilled PT to increase their independence and safety with mobility to allow discharge.       If plan is discharge home, recommend the following: Two people to help with walking and/or transfers;Two people to help with bathing/dressing/bathroom;Assistance with cooking/housework;Assist for transportation;Help with stairs or ramp for entrance   Can travel by private vehicle     No  Equipment Recommendations  Wheelchair (measurements PT);Wheelchair cushion (measurements PT);Hoyer lift;Rolling walker (2 wheels)    Recommendations for Other Services       Precautions / Restrictions Precautions Precautions: Fall Recall of Precautions/Restrictions: Intact Precaution/Restrictions Comments: Airborne precautions as they are ruling out COVID/Flu Restrictions Weight Bearing Restrictions Per Provider Order: No     Mobility  Bed Mobility Overal bed mobility: Needs  Assistance Bed Mobility: Supine to Sit, Sit to Supine     Supine to sit: Min assist, HOB elevated     General bed mobility comments: Min  assistance needed to manage BLE off of bed.  Needed incr time    Transfers Overall transfer level: Needs assistance Equipment used: Ambulation equipment used Transfers: Sit to/from Stand Sit to Stand: Max assist, +2 physical assistance, +2 safety/equipment, From elevated surface, Via lift equipment           General transfer comment: Pt stood to [place] 4 x initially needing Max A of 2 to stand from EOB with bed elevated. Pt required increased time to come to standing but able to achieve full standing position. Pt supported in lateral weight shift while standing. Able to maintain standing for 1-2 mins, and then would rest on buttock pads with pillow at knees.  Pt was moved to the recliner to sit up.  Pt was able to march in place picking feet up 2 x and very slowly. Transfer via Lift Equipment: Stedy  Ambulation/Gait               General Gait Details: unable   Comptroller Bed    Modified Rankin (Stroke Patients Only)       Balance Overall balance assessment: Needs assistance Sitting-balance support: No upper extremity supported, Feet supported Sitting balance-Leahy Scale: Good     Standing balance support: Bilateral upper extremity supported, During functional activity Standing balance-Leahy Scale: Poor Standing balance comment: Pt  requires Min A of 2 to maintain standing balance with use of Stedy/bil Ue support                            Communication Communication Communication: No apparent difficulties  Cognition Arousal: Alert Behavior During Therapy: WFL for tasks assessed/performed   PT - Cognitive impairments: No apparent impairments                         Following commands: Intact      Cueing Cueing Techniques: Verbal cues, Tactile cues   Exercises General Exercises - Lower Extremity Ankle Circles/Pumps: AROM, Both, 10 reps Long Arc Quad: AROM, Both, 5 reps, Seated Heel Slides: AROM, Both, 5 reps, Supine    General Comments General comments (skin integrity, edema, etc.): VSS on RA      Pertinent Vitals/Pain Pain Assessment Pain Assessment: 0-10 Pain Score: 8  Pain Location: bilateral knees down to leg Pain Descriptors / Indicators: Burning, Grimacing, Guarding, Constant Pain Intervention(s): Limited activity within patient's tolerance, Monitored during session, Premedicated before session, Repositioned    Home Living                          Prior Function            PT Goals (current goals can now be found in the care plan section) Acute Rehab PT Goals Patient Stated Goal: to reduce pain and return home Progress towards PT goals: Progressing toward goals    Frequency    Min 2X/week      PT Plan      Co-evaluation              AM-PAC PT 6 Clicks Mobility   Outcome Measure  Help needed turning from your back to your side while in a flat bed without using bedrails?: A Little Help needed moving from lying on your back to sitting on the side of a flat bed without using bedrails?: A Little Help needed moving to and from a bed to a chair (including a wheelchair)?: Total Help needed standing up from a chair using your arms (e.g., wheelchair or bedside chair)?: Total Help needed to walk in hospital room?: Total Help needed climbing 3-5 steps with a railing? : Total 6 Click Score: 10    End of Session Equipment Utilized During Treatment: Gait belt Activity Tolerance: Patient limited by pain;Patient limited by fatigue Patient left: with call bell/phone within reach;in chair;with chair alarm set Nurse Communication: Mobility status;Need for lift equipment (use Stedy) PT Visit Diagnosis: Unsteadiness on feet (R26.81);Muscle weakness (generalized) (M62.81);Pain Pain - Right/Left:  Left Pain - part of body: Knee (bilateral)     Time: 8863-8787 PT Time Calculation (min) (ACUTE ONLY): 36 min  Charges:    $Gait Training: 8-22 mins $Therapeutic Exercise: 8-22 mins PT General Charges $$ ACUTE PT VISIT: 1 Visit                     Bayli Quesinberry M,PT Acute Rehab Services [phone]    Stephane JULIANNA Bevel 07/31/2024, 1:53 PM

## 2024-07-31 NOTE — Evaluation (Signed)
 Occupational Therapy Evaluation Patient Details Name: [name] MRN: [id number] DOB: October 05, 1955 Today's Date: 07/31/2024   History of Present Illness   The pt is a 69 yo female presenting 1/17 after pt with AMS and fall out of her car (landing on knees) at HD. Pt hypotensive and with pinpoint pupils with EMS. Admitted for management of acute encephalopathy due to polypharmacy. PMH includes: arthritis (bilateral knees), ESRD on HD, CAD, CHF, DM II, HTN, MI, obesity (BMI 37).     Clinical Impressions PTA Pt reports Mod I with functional mobility with cane and requires assistance for tub transfer, Independent with BADLs. Pt currently requires up to Max A +2 overall for functional transfers and up to Total A for LB ADL engagement. Pt primarily limited by bilateral knee pain, decreased activity tolerance, unsteadiness on feet, and generalized weakness. OT to continue to follow Pt acutely to facilitate progress towards goals. Patient will benefit from continued inpatient follow up therapy, <3 hours/day.      If plan is discharge home, recommend the following:   Two people to help with walking and/or transfers;A lot of help with bathing/dressing/bathroom;Assistance with cooking/housework;Assist for transportation;Help with stairs or ramp for entrance     Functional Status Assessment   Patient has had a recent decline in their functional status and demonstrates the ability to make significant improvements in function in a reasonable and predictable amount of time.     Equipment Recommendations   Other (comment) (defer)     Recommendations for Other Services         Precautions/Restrictions   Precautions Precautions: Fall Recall of Precautions/Restrictions: Intact Restrictions Weight Bearing Restrictions Per Provider Order: No     Mobility Bed Mobility Overal bed mobility: Needs Assistance Bed Mobility: Supine to Sit, Sit to Supine     Supine to sit: Min assist, HOB  elevated Sit to supine: Mod assist   General bed mobility comments: Min A to facilitate scoot towards HOB. No assistance needed to manage BLE off of bed    Transfers Overall transfer level: Needs assistance Equipment used: 2 person hand held assist Transfers: Sit to/from Stand Sit to Stand: Max assist, +2 physical assistance, +2 safety/equipment, From elevated surface          Lateral/Scoot Transfers: Contact guard assist General transfer comment: Pt required +2 hand held assist and Max A to stand from EOB. Pt required increased time to come to standing but able to achieve full standing position. Pt supported in lateral weight shift while standing. Able to maintain standing for 1 minute, increased pain and Pt requesting to sit. Unable to march in place.      Balance Overall balance assessment: Needs assistance Sitting-balance support: No upper extremity supported, Feet supported Sitting balance-Leahy Scale: Good     Standing balance support: Bilateral upper extremity supported, During functional activity Standing balance-Leahy Scale: Zero Standing balance comment: Pt requires Max A to maintain standing balance                           ADL either performed or assessed with clinical judgement   ADL Overall ADL's : Needs assistance/impaired Eating/Feeding: Independent;Sitting   Grooming: Set up;Sitting   Upper Body Bathing: Minimal assistance;Sitting   Lower Body Bathing: Maximal assistance   Upper Body Dressing : Set up;Sitting   Lower Body Dressing: Total assistance   Toilet Transfer: +2 for physical assistance;+2 for safety/equipment Toilet Transfer Details (indicate cue type and reason): Pt  currently unable to take steps in standing. Would require +2 assist for stand piv to [facility] Toileting- Clothing Manipulation and Hygiene: Maximal assistance               Vision Patient Visual Report: No change from baseline Vision Assessment?: No apparent visual  deficits     Perception         Praxis         Pertinent Vitals/Pain Pain Assessment Pain Assessment: 0-10 Pain Score: 8  Pain Location: bilateral knees down to leg Pain Descriptors / Indicators: Burning, Grimacing, Guarding, Constant Pain Intervention(s): Limited activity within patient's tolerance, Monitored during session, Premedicated before session, Repositioned     Extremity/Trunk Assessment Upper Extremity Assessment Upper Extremity Assessment: Generalized weakness;RUE deficits/detail RUE Deficits / Details: Decreased shoulder ROM . limited to ~70 degrees shoulder flexion without pain or compensatory strategies RUE: Shoulder pain with ROM RUE Sensation: WNL RUE Coordination: decreased fine motor;decreased gross motor   Lower Extremity Assessment Lower Extremity Assessment: Defer to PT evaluation   Cervical / Trunk Assessment Cervical / Trunk Assessment: Other exceptions Cervical / Trunk Exceptions: increased body habitus   Communication Communication Communication: No apparent difficulties   Cognition Arousal: Alert Behavior During Therapy: WFL for tasks assessed/performed Cognition: No apparent impairments                               Following commands: Intact       Cueing  General Comments   Cueing Techniques: Verbal cues;Tactile cues  VSS on RA. Pt labile near end of session.   Exercises     Shoulder Instructions      Home Living Family/patient expects to be discharged to:: Private residence Living Arrangements: Children Available Help at Discharge: Family;Available 24 hours/day Type of Home: House Home Access: Stairs to enter [employer] of Steps: 4 Entrance Stairs-Rails: Left Home Layout: One level     Bathroom Shower/Tub: Tub/shower unit;Sponge bathes at baseline   [employer]: Standard     Home Equipment: Cane - quad;BSC/3in1;Toilet riser;Standard Environmental [employer]ant          Prior Functioning/Environment  Prior Level of Function : Needs assist             Mobility Comments: walking with cane in her house, standard walker when pain is elevated, no other falls in last 6 months      OT Problem List: Decreased strength;Decreased activity tolerance;Impaired balance (sitting and/or standing);Decreased knowledge of use of DME or AE;Pain;Decreased range of motion   OT Treatment/Interventions: Self-care/ADL training;Therapeutic exercise;Energy conservation;DME and/or AE instruction;Therapeutic activities;Patient/family education;Balance training      OT Goals(Current goals can be found in the care plan section)   Acute Rehab OT Goals Patient Stated Goal: to walk again OT Goal Formulation: With patient Time For Goal Achievement: 08/14/24 Potential to Achieve Goals: Good ADL Goals Pt Will Perform Lower Body Bathing: with min assist;with adaptive equipment Pt Will Perform Lower Body Dressing: with min assist;with adaptive equipment Pt Will Transfer to Toilet: with min assist;stand pivot transfer;bedside commode Additional ADL Goal #1: Pt will tolerate 5 minute OOB task to address decreased activity tolerance.   OT Frequency:  Min 2X/week    Co-evaluation              AM-PAC OT 6 Clicks Daily Activity     Outcome Measure Help from another person eating meals?: None Help from another person taking care of personal grooming?: A Little  Help from another person toileting, which includes using toliet, bedpan, or urinal?: A Lot Help from another person bathing (including washing, rinsing, drying)?: A Lot Help from another person to put on and taking off regular upper body clothing?: A Little Help from another person to put on and taking off regular lower body clothing?: Total 6 Click Score: 15   End of Session Equipment Utilized During Treatment: Gait belt Nurse Communication: Mobility status  Activity Tolerance: Patient tolerated treatment well Patient left: in bed;with call  bell/phone within reach;with bed alarm set  OT Visit Diagnosis: Unsteadiness on feet (R26.81);Muscle weakness (generalized) (M62.81);History of falling (Z91.81);Pain                Time: 9176-9148 OT Time Calculation (min): 28 min Charges:  OT General Charges $OT Visit: 1 Visit OT Evaluation $OT Eval Low Complexity: 1 Low OT Treatments $Therapeutic Activity: 8-22 mins  Maurilio CROME, OTR/L.  [facility] Acute Rehabilitation  Office: [phone]   Maurilio PARAS Demaris Bousquet 07/31/2024, 11:05 AM

## 2024-07-31 NOTE — Progress Notes (Unsigned)
 Enrolled for [employer] to mail a ZIO XT long term holter monitor to the patients address on file.   Dr. ALONSO Maywood to read.

## 2024-07-31 NOTE — Consult Note (Signed)
 Reason for Consult:Bilateral knee pain Referring Physician: Ripudeep Rai Time called: 1019 Time at bedside: 1045   [name] is an 69 y.o. female.  HPI: [name] was admitted with AMS. While here she has c/o bilateral knee pain and orthopedic surgery was consulted. She has endstage OA and was seeing Dr. Kay for this. Unfortunately steroid injections became ineffective and Simvisc treatment was not helpful. She was advised to get TKA but needed to get her A1c down. She has done this but hasn't followed up.   Past Medical History:  Diagnosis Date   Acute diastolic CHF (congestive heart failure) (HCC)    Acute encephalopathy 10/20/2022   Acute metabolic encephalopathy 06/02/2017   Acute on chronic diastolic CHF (congestive heart failure) (HCC) 10/03/2017   Acute pulmonary edema (HCC) 09/14/2020   Acute respiratory failure with hypoxia (HCC) 09/13/2020   Anemia    Aortic stenosis    Echo 8/18: mean 13, peak 28, LVOT/AV mean velocity 0.51   Arthritis    Asthma    As a child    Bronchitis    CAD (coronary artery disease)    a. 09/2016: 50% Ost 1st Mrg stenosis, 50% 2nd Mrg stenosis, 20% Mid-Cx, 95% Prox LAD, 40% mid-LAD, and 10% dist-LAD stenosis. Staged PCI with DES to Prox-LAD.    Chronic combined systolic and diastolic CHF (congestive heart failure) (HCC) 2011   echo 2/18: EF 55-60, normal wall motion, grade 2 diastolic dysfunction, trivial AI // echo 3/18: Septal and apical HK, EF 45-50, normal wall motion, trivial AI, mild LAE, PASP 38 // echo 8/18: EF 60-65, normal wall motion, grade 1 diastolic dysfunction, calcified aortic valve leaflets, mild aortic stenosis (mean 13, peak 28, LVOT/AV mean velocity 0.51), mild AI, moderate MAC, mild LAE, trivial TR    Complication of anesthesia    Depression    Diabetes mellitus Dx 1989   Diabetic hyperosmolar non-ketotic state (HCC) 12/13/2016   Elevated lipids    ESRD on hemodialysis (HCC)    TTS at [facility]   GERD (gastroesophageal reflux  disease)    GI bleed 09/17/2021   Gout    Heart murmur    asymptomatic   Hepatitis C Dx 2013   Hypertension Dx 1989   Hypervolemia associated with renal insufficiency 10/31/2019   Infected surgical wound    Lt arm   Myocardial infarction (HCC) 07/2015   Obesity    Pancreatitis 2013   Pneumonia    Refusal of blood transfusions as patient is Jehovah's Witness    pt states she is not jehovas witness and does not refuse blood products   Severe sepsis (HCC) 04/21/2021   Tendinitis    Tremors of nervous system    LEFT HAND   Ulcer 2010    Past Surgical History:  Procedure Laterality Date   A/V FISTULAGRAM Left 04/11/2019   Procedure: A/V FISTULAGRAM;  Surgeon: Jama Cordella MATSU, MD;  Location: [facility];  Service: Cardiovascular;  Laterality: Left;   A/V FISTULAGRAM Left 06/02/2019   Procedure: A/V FISTULAGRAM;  Surgeon: Jama Cordella MATSU, MD;  Location: [facility];  Service: Cardiovascular;  Laterality: Left;   AMPUTATION Left 10/28/2022   Procedure: LEFT FOOT 1ST [place] AMPUTATION;  Surgeon: Harden Jerona GAILS, MD;  Location: [facility] OR;  Service: Orthopedics;  Laterality: Left;   APPLICATION OF WOUND VAC Left 08/14/2017   Procedure: APPLICATION OF WOUND VAC Exchange;  Surgeon: Dessa Reyes ORN, MD;  Location: [facility] ORS;  Service: General;  Laterality: Left;  APPLICATION OF WOUND VAC Left 12/21/2018   Procedure: APPLICATION OF WOUND VAC;  Surgeon: Jama Cordella MATSU, MD;  Location: [facility] ORS;  Service: Vascular;  Laterality: Left;   AV FISTULA PLACEMENT Left 08/19/2018   Procedure: ARTERIOVENOUS (AV) FISTULA CREATION ( BRACHIOBASILIC );  Surgeon: Jama Cordella MATSU, MD;  Location: [facility] ORS;  Service: Vascular;  Laterality: Left;   BASCILIC VEIN TRANSPOSITION Left 11/18/2018   Procedure: BASCILIC VEIN TRANSPOSITION;  Surgeon: Jama Cordella MATSU, MD;  Location: [facility]S;  Service: Vascular;  Laterality: Left;   BIOPSY  09/20/2020   Procedure: BIOPSY;  Surgeon: Rollin Dover, MD;   Location: [facility] ENDOSCOPY;  Service: Endoscopy;;   CHOLECYSTECTOMY     COLONOSCOPY WITH PROPOFOL  N/A 02/03/2018   Procedure: COLONOSCOPY WITH PROPOFOL ;  Surgeon: Unk Corinn Skiff, MD;  Location: [facility] ENDOSCOPY;  Service: Gastroenterology;  Laterality: N/A;   COLONOSCOPY WITH PROPOFOL  N/A 07/25/2022   Procedure: COLONOSCOPY WITH PROPOFOL ;  Surgeon: Maryruth Ole DASEN, MD;  Location: [facility] ENDOSCOPY;  Service: Endoscopy;  Laterality: N/A;   CORONARY ANGIOPLASTY  07/2015   STENT   CORONARY STENT INTERVENTION N/A 09/18/2016   Procedure: Coronary Stent Intervention;  Surgeon: Debby DELENA Sor, MD;  [facility];  Service: Cardiovascular;  Laterality: N/A;   DIALYSIS/PERMA CATHETER INSERTION N/A 05/10/2018   Procedure: DIALYSIS/PERMA CATHETER INSERTION;  Surgeon: Jama Cordella MATSU, MD;  Location: [facility];  Service: Cardiovascular;  Laterality: N/A;   DIALYSIS/PERMA CATHETER INSERTION N/A 01/21/2024   Procedure: DIALYSIS/PERMA CATHETER INSERTION;  Surgeon: Lanis Fonda BRAVO, MD;  Location: [facility];  Service: Cardiovascular;  Laterality: N/A;   DRESSING CHANGE UNDER ANESTHESIA Left 08/15/2017   Procedure: exploration of wound for bleeding;  Surgeon: Dessa Reyes ORN, MD;  Location: [facility] ORS;  Service: General;  Laterality: Left;   ESOPHAGOGASTRODUODENOSCOPY (EGD) WITH PROPOFOL  N/A 02/03/2018   Procedure: ESOPHAGOGASTRODUODENOSCOPY (EGD) WITH PROPOFOL ;  Surgeon: Unk Corinn Skiff, MD;  Location: [facility] ENDOSCOPY;  Service: Gastroenterology;  Laterality: N/A;   ESOPHAGOGASTRODUODENOSCOPY (EGD) WITH PROPOFOL  N/A 09/20/2020   Procedure: ESOPHAGOGASTRODUODENOSCOPY (EGD) WITH PROPOFOL ;  Surgeon: Rollin Dover, MD;  Location: [facility] ENDOSCOPY;  Service: Endoscopy;  Laterality: N/A;   ESOPHAGOGASTRODUODENOSCOPY (EGD) WITH PROPOFOL  N/A 07/25/2022   Procedure: ESOPHAGOGASTRODUODENOSCOPY (EGD) WITH PROPOFOL ;  Surgeon: Maryruth Ole DASEN, MD;  Location: [facility] ENDOSCOPY;  Service: Endoscopy;   Laterality: N/A;   EYE SURGERY  11/17/2018   INCISION AND DRAINAGE ABSCESS Left 08/12/2017   Procedure: INCISION AND DRAINAGE ABSCESS;  Surgeon: Dessa Reyes ORN, MD;  Location: [facility]S;  Service: General;  Laterality: Left;   KNEE ARTHROSCOPY     LEFT HEART CATH N/A 09/18/2016   Procedure: Left Heart Cath;  Surgeon: Debby DELENA Sor, MD;  [facility];  Service: Cardiovascular;  Laterality: N/A;   LEFT HEART CATH AND CORONARY ANGIOGRAPHY N/A 09/16/2016   Procedure: Left Heart Cath and Coronary Angiography;  Surgeon: Lonni JONETTA Cash, MD;  Location: [facility] INVASIVE CV LAB;  Service: Cardiovascular;  Laterality: N/A;   LEFT HEART CATH AND CORONARY ANGIOGRAPHY N/A 04/29/2017   Procedure: LEFT HEART CATH AND CORONARY ANGIOGRAPHY;  Surgeon: Mady Lonni, MD;  [facility];  Service: Cardiovascular;  Laterality: N/A;   LOWER EXTREMITY ANGIOGRAPHY Right 03/08/2018   Procedure: LOWER EXTREMITY ANGIOGRAPHY;  Surgeon: Jama Cordella MATSU, MD;  Location: [facility];  Service: Cardiovascular;  Laterality: Right;   PERIPHERAL VASCULAR THROMBECTOMY  01/21/2024   Procedure: PERIPHERAL VASCULAR THROMBECTOMY;  Surgeon: Lanis Fonda BRAVO, MD;  Location: [facility];  Service: Cardiovascular;;   TUBAL LIGATION     TUBAL LIGATION     UPPER EXTREMITY ANGIOGRAPHY Right 09/19/2019   Procedure: UPPER EXTREMITY ANGIOGRAPHY;  Surgeon: Jama Cordella MATSU, MD;  Location: [facility];  Service: Cardiovascular;  Laterality: Right;   VENOUS STENT  01/21/2024   Procedure: VENOUS STENT;  Surgeon: Lanis Fonda BRAVO, MD;  Location: [facility];  Service: Cardiovascular;;   WOUND DEBRIDEMENT Left 12/21/2018   Procedure: DEBRIDEMENT WOUND;  Surgeon: Jama Cordella MATSU, MD;  Location: [facility];  Service: Vascular;  Laterality: Left;   WOUND DEBRIDEMENT Left 12/30/2018   Procedure: DEBRIDEMENT WOUND WITH VAC PLACEMENT (LEFT UPPER EXTREMITY);  Surgeon: Jama Cordella MATSU, MD;  Location:  [facility];  Service: Vascular;  Laterality: Left;    Family History  Problem Relation Age of Onset   Colon cancer Mother    Heart attack Other    Heart attack Maternal Grandmother    Hypertension Sister    Hypertension Brother    Diabetes Paternal Grandmother    Breast cancer Neg Hx     Social History:  reports that she quit smoking about 43 years ago. Her smoking use included cigarettes. She started smoking about 49 years ago. She has a 1.5 pack-year smoking history. She has never used smokeless tobacco. She reports that she does not currently use alcohol. She reports that she does not currently use drugs after having used the following drugs: Marijuana.  Allergies: Allergies[1]  Medications: I have reviewed the patient's current medications.  Results for orders placed or performed during the hospital encounter of 07/29/24 (from the past 48 hours)  Comprehensive metabolic panel     Status: Abnormal   Collection Time: 07/29/24 12:50 PM  Result Value Ref Range   Sodium 136 135 - 145 mmol/L   Potassium 5.5 (H) 3.5 - 5.1 mmol/L    Comment: HEMOLYSIS AT THIS LEVEL MAY AFFECT RESULT   Chloride 93 (L) 98 - 111 mmol/L   CO2 25 22 - 32 mmol/L   Glucose, Bld 236 (H) 70 - 99 mg/dL    Comment: Glucose reference range applies only to samples taken after fasting for at least 8 hours.   BUN 40 (H) 8 - 23 mg/dL   Creatinine, Ser 3.01 (H) 0.44 - 1.00 mg/dL   Calcium  10.1 8.9 - 10.3 mg/dL   Total Protein 6.9 6.5 - 8.1 g/dL   Albumin  4.1 3.5 - 5.0 g/dL   AST 26 15 - 41 U/L    Comment: Hemolysis at this level may affect result    ALT 27 0 - 44 U/L    Comment: Hemolysis at this level may affect result    Alkaline Phosphatase 270 (H) 38 - 126 U/L   Total Bilirubin 0.3 0.0 - 1.2 mg/dL   GFR, Estimated 6 (L) >60 mL/min    Comment: (NOTE) Calculated using the CKD-EPI Creatinine Equation (2021)    Anion gap 19 (H) 5 - 15    Comment: Performed at [facility] Lab, [address]  [place], [place] [address]  Blood culture (routine x 2)     Status: None (Preliminary result)   Collection Time: 07/29/24 12:50 PM   Specimen: BLOOD RIGHT HAND  Result Value Ref Range   Specimen Description BLOOD RIGHT HAND    Special Requests AEROBIC BOTTLE ONLY Blood Culture adequate volume    Culture      NO GROWTH 2 DAYS Performed at [facility]  Lab, [address], [place], [place] [address]    Report Status PENDING   Troponin T, High Sensitivity     Status: Abnormal   Collection Time: 07/29/24 12:50 PM  Result Value Ref Range   Troponin T High Sensitivity 235 (HH) 0 - 19 ng/L    Comment: Critical Value, Read Back and verified with HAIRSTON. JASMINE RN @ 1350 07/29/24 S OUROBANGNA (NOTE) Biotin concentrations > 1000 ng/mL falsely decrease TnT results.  Serial cardiac troponin measurements are suggested.  Refer to the Links section for chest pain algorithms and additional  guidance. Performed at [facility] Lab, [address], [place], [place] [address]   I-Stat CG4 Lactic Acid     Status: Abnormal   Collection Time: 07/29/24  1:20 PM  Result Value Ref Range   Lactic Acid, Venous 2.9 (HH) 0.5 - 1.9 mmol/L   Comment NOTIFIED PHYSICIAN   I-Stat venous blood gas, ([facility], [facility], [facility])     Status: Abnormal   Collection Time: 07/29/24  1:20 PM  Result Value Ref Range   pH, Ven 7.419 7.25 - 7.43   pCO2, Ven 47.2 44 - 60 mmHg   pO2, Ven 141 (H) 32 - 45 mmHg   Bicarbonate 30.6 (H) 20.0 - 28.0 mmol/L   TCO2 32 22 - 32 mmol/L   O2 Saturation 99 %   Acid-Base Excess 5.0 (H) 0.0 - 2.0 mmol/L   Sodium 135 135 - 145 mmol/L   Potassium 5.1 3.5 - 5.1 mmol/L   Calcium , Ion 1.04 (L) 1.15 - 1.40 mmol/L   HCT 36.0 36.0 - 46.0 %   Hemoglobin 12.2 12.0 - 15.0 g/dL   Sample type VENOUS   Ethanol     Status: None   Collection Time: 07/29/24  2:00 PM  Result Value Ref Range   Alcohol, Ethyl (B) <15 <15 mg/dL    Comment: (NOTE) For medical purposes only. Performed at [facility]  Lab, [address], [place], [place] [address]   Blood culture (routine x 2)     Status: None (Preliminary result)   Collection Time: 07/29/24  2:00 PM   Specimen: BLOOD RIGHT ARM  Result Value Ref Range   Specimen Description BLOOD RIGHT ARM    Special Requests      BOTTLES DRAWN AEROBIC AND ANAEROBIC Blood Culture adequate volume   Culture      NO GROWTH 2 DAYS Performed at [facility] Lab, [address], [place], [place] [address]    Report Status PENDING   CBC with Differential/Platelet     Status: Abnormal   Collection Time: 07/29/24  2:00 PM  Result Value Ref Range   WBC 6.9 4.0 - 10.5 K/uL   RBC 4.11 3.87 - 5.11 MIL/uL   Hemoglobin 11.1 (L) 12.0 - 15.0 g/dL   HCT 62.2 63.9 - 53.9 %   MCV 91.7 80.0 - 100.0 fL   MCH 27.0 26.0 - 34.0 pg   MCHC 29.4 (L) 30.0 - 36.0 g/dL   RDW 83.6 (H) 88.4 - 84.4 %   Platelets 160 150 - 400 K/uL   nRBC 0.0 0.0 - 0.2 %   Neutrophils Relative % 66 %   Neutro Abs 4.6 1.7 - 7.7 K/uL   Lymphocytes Relative 22 %   Lymphs Abs 1.5 0.7 - 4.0 K/uL   Monocytes Relative 9 %   Monocytes Absolute 0.6 0.1 - 1.0 K/uL   Eosinophils Relative 3 %   Eosinophils Absolute 0.2 0.0 -  0.5 K/uL   Basophils Relative 0 %   Basophils Absolute 0.0 0.0 - 0.1 K/uL   Immature Granulocytes 0 %   Abs Immature Granulocytes 0.03 0.00 - 0.07 K/uL    Comment: Performed at [facility] Lab, [address], [place], [place] [address]  Hepatitis B surface antigen     Status: None   Collection Time: 07/29/24  4:00 PM  Result Value Ref Range   Hepatitis B Surface Ag NON REACTIVE NON REACTIVE    Comment: Performed at [facility] Lab, [address], [place], [place] [address]  TSH     Status: None   Collection Time: 07/29/24 10:55 PM  Result Value Ref Range   TSH 0.858 0.350 - 4.500 uIU/mL    Comment: Performed at [facility] Lab, [address] [place], [place] [address]  Vitamin B12     Status: None   Collection Time: 07/29/24 10:55 PM  Result Value Ref Range    Vitamin B-12 602 180 - 914 pg/mL    Comment: Performed at [facility] Lab, [address] [place], [place] [address]  Procalcitonin     Status: None   Collection Time: 07/29/24 10:55 PM  Result Value Ref Range   Procalcitonin 0.72 ng/mL    Comment: (NOTE)   Sepsis PCT Algorithm          Lower Respiratory Tract Infection                                         PCT Algorithm -----------------------------------------------------------------  <0.5 ng/mL                    <0.10 ng/mL  Associated with low           Antibiotic therapy strongly   risk for progression          discouraged. Indicates absence   to severe sepsis              of bacteria infection  and/or septic shock             --------------------------------------------------------------  0.5-2.0 ng/mL                 0.10-0.25 ng/mL  Recommended to retest         Antibiotic therapy discouraged.  PCT within 6-24 hours         Bacterial infection unlikely  ------------------------------------------------------------  >2 ng/mL                      0.26-0.50 ng/mL  Associated with high risk     Antibiotic therapy encouraged.  for progression to severe     Bacterial infection possible  sepsis/and or septic shock    ------------------------------                                 >0.50 ng/mL                                Antibiotic therapy strongly                                 encouraged.  Suggestive of presence of                                 bacterial infection.                                 -------------------------------------------------------------------  < or = 0.50 ng/mL OR          < or = 0.25 OR 80% decrease in PCT  80% decrease in PCT           Antibiotic therapy   Antibiotic therapy may        may be discontinued  be discontinued                                 Performed at [facility] Lab, [address], [place], [place] [address]   Troponin T, High Sensitivity      Status: Abnormal   Collection Time: 07/29/24 10:55 PM  Result Value Ref Range   Troponin T High Sensitivity 216 (HH) 0 - 19 ng/L    Comment: Critical value noted. Value is consistent with previously reported and called value  (NOTE) Biotin concentrations > 1000 ng/mL falsely decrease TnT results.  Serial cardiac troponin measurements are suggested.  Refer to the Links section for chest pain algorithms and additional  guidance. Performed at [facility] Lab, [address] [place], [place] [address]   Lactic acid, plasma     Status: Abnormal   Collection Time: 07/29/24 10:55 PM  Result Value Ref Range   Lactic Acid, Venous 2.4 (HH) 0.5 - 1.9 mmol/L    Comment: Critical Value, Read Back and verified with BRENDA TATE, RN ON [id number] AT 2353 BY SWEETSELL CUSTODIO Performed at [facility] Lab, [address] [place], [place] [address]   Comprehensive metabolic panel     Status: Abnormal   Collection Time: 07/30/24  4:06 AM  Result Value Ref Range   Sodium 135 135 - 145 mmol/L   Potassium 4.2 3.5 - 5.1 mmol/L    Comment: HEMOLYSIS AT THIS LEVEL MAY AFFECT RESULT Delta check noted     Chloride 93 (L) 98 - 111 mmol/L   CO2 26 22 - 32 mmol/L   Glucose, Bld 133 (H) 70 - 99 mg/dL    Comment: Glucose reference range applies only to samples taken after fasting for at least 8 hours.   BUN 22 8 - 23 mg/dL   Creatinine, Ser 5.61 (H) 0.44 - 1.00 mg/dL   Calcium  10.0 8.9 - 10.3 mg/dL   Total Protein 7.0 6.5 - 8.1 g/dL   Albumin  4.1 3.5 - 5.0 g/dL   AST 23 15 - 41 U/L    Comment: HEMOLYSIS AT THIS LEVEL MAY AFFECT RESULT   ALT 22 0 - 44 U/L   Alkaline Phosphatase 273 (H) 38 - 126 U/L   Total Bilirubin 0.3 0.0 - 1.2 mg/dL   GFR, Estimated 11 (L) >60 mL/min    Comment: (NOTE) Calculated using the CKD-EPI Creatinine Equation (2021)    Anion gap 16 (H) 5 - 15    Comment: Performed at [facility] Lab, [address] [place], [place] [address]  CBC     Status: Abnormal    Collection Time: 07/30/24  4:06 AM  Result Value  Ref Range   WBC 6.5 4.0 - 10.5 K/uL   RBC 4.35 3.87 - 5.11 MIL/uL   Hemoglobin 12.1 12.0 - 15.0 g/dL   HCT 61.0 63.9 - 53.9 %   MCV 89.4 80.0 - 100.0 fL   MCH 27.8 26.0 - 34.0 pg   MCHC 31.1 30.0 - 36.0 g/dL   RDW 83.5 (H) 88.4 - 84.4 %   Platelets 142 (L) 150 - 400 K/uL   nRBC 0.0 0.0 - 0.2 %    Comment: Performed at [facility] Lab, [address], [place], [place] [address]  Lactic acid, plasma     Status: None   Collection Time: 07/30/24  4:06 AM  Result Value Ref Range   Lactic Acid, Venous 1.1 0.5 - 1.9 mmol/L    Comment: Performed at [facility] Lab, [address] [place], [place] [address]  Glucose, capillary     Status: Abnormal   Collection Time: 07/30/24  8:22 AM  Result Value Ref Range   Glucose-Capillary 191 (H) 70 - 99 mg/dL    Comment: Glucose reference range applies only to samples taken after fasting for at least 8 hours.  Glucose, capillary     Status: Abnormal   Collection Time: 07/30/24 12:07 PM  Result Value Ref Range   Glucose-Capillary 203 (H) 70 - 99 mg/dL    Comment: Glucose reference range applies only to samples taken after fasting for at least 8 hours.  Glucose, capillary     Status: Abnormal   Collection Time: 07/30/24  5:49 PM  Result Value Ref Range   Glucose-Capillary 227 (H) 70 - 99 mg/dL    Comment: Glucose reference range applies only to samples taken after fasting for at least 8 hours.  Glucose, capillary     Status: Abnormal   Collection Time: 07/30/24  8:37 PM  Result Value Ref Range   Glucose-Capillary 402 (H) 70 - 99 mg/dL    Comment: Glucose reference range applies only to samples taken after fasting for at least 8 hours.   Comment 1 Notify RN   Glucose, capillary     Status: Abnormal   Collection Time: 07/30/24  8:39 PM  Result Value Ref Range   Glucose-Capillary 318 (H) 70 - 99 mg/dL    Comment: Glucose reference range applies only to samples taken after fasting for at least  8 hours.   Comment 1 Notify RN   Glucose, capillary     Status: Abnormal   Collection Time: 07/31/24  7:31 AM  Result Value Ref Range   Glucose-Capillary 160 (H) 70 - 99 mg/dL    Comment: Glucose reference range applies only to samples taken after fasting for at least 8 hours.   *Note: Due to a large number of results and/or encounters for the requested time period, some results have not been displayed. A complete set of results can be found in Results Review.    ECHOCARDIOGRAM COMPLETE Result Date: 07/30/2024    ECHOCARDIOGRAM REPORT   Patient Name:   ALIZABETH ANTONIO Date of Exam: 07/30/2024 Medical Rec #:  [id number]     Height:       65.0 in Accession #:    [id number]    Weight:       227.7 lb Date of Birth:  1955/08/18     BSA:          2.090 m Patient Age:    68 years      BP:  125/44 mmHg Patient Gender: F             HR:           86 bpm. Exam Location:  Inpatient Procedure: 2D Echo, Cardiac Doppler, Color Doppler and PEDOF (Both Spectral and            Color Flow Doppler were utilized during procedure). Indications:    Aortic Stenosis I35.0  History:        Patient has prior history of Echocardiogram examinations, most                 recent 03/04/2024. CHF, CAD and Previous Myocardial Infarction,                 COPD, Aortic Valve Disease; Risk Factors:Dyslipidemia,                 Hypertension, Former Smoker, Diabetes, Sleep Apnea and ESRD,                 GERD.  Sonographer:    Koleen Popper RDCS Referring Phys: WADDELL DELENA DONATH  Sonographer Comments: Image acquisition challenging due to patient body habitus. IMPRESSIONS  1. Left ventricular ejection fraction, by estimation, is 65 to 70%. The left ventricle has normal function. The left ventricle has no regional wall motion abnormalities. Left ventricular diastolic parameters are consistent with Grade I diastolic dysfunction (impaired relaxation).  2. Right ventricular systolic function is normal. The right ventricular size is normal.  Tricuspid regurgitation signal is inadequate for assessing PA pressure.  3. The mitral valve is degenerative. Trivial mitral valve regurgitation. Mild to moderate mitral stenosis. The mean mitral valve gradient is 6.5 mmHg with average heart rate of 86 bpm. Severe mitral annular calcification.  4. The aortic valve was not well visualized. Aortic valve regurgitation is mild. Moderate aortic valve stenosis. Aortic valve area, by VTI measures 1.16 cm. Aortic valve mean gradient measures 23.0 mmHg. Aortic valve Vmax measures 3.20 m/s.  5. The inferior vena cava is normal in size with greater than 50% respiratory variability, suggesting right atrial pressure of 3 mmHg. Comparison(s): No significant change from prior study. FINDINGS  Left Ventricle: Left ventricular ejection fraction, by estimation, is 65 to 70%. The left ventricle has normal function. The left ventricle has no regional wall motion abnormalities. The left ventricular internal cavity size was normal in size. There is  no left ventricular hypertrophy. Left ventricular diastolic parameters are consistent with Grade I diastolic dysfunction (impaired relaxation). Right Ventricle: The right ventricular size is normal. No increase in right ventricular wall thickness. Right ventricular systolic function is normal. Tricuspid regurgitation signal is inadequate for assessing PA pressure. Left Atrium: Left atrial size was normal in size. Right Atrium: Right atrial size was normal in size. Pericardium: There is no evidence of pericardial effusion. Mitral Valve: The mitral valve is degenerative in appearance. Severe mitral annular calcification. Trivial mitral valve regurgitation. Mild to moderate mitral valve stenosis. MV peak gradient, 15.2 mmHg. The mean mitral valve gradient is 6.5 mmHg with average heart rate of 86 bpm. Tricuspid Valve: The tricuspid valve is grossly normal. Tricuspid valve regurgitation is trivial. No evidence of tricuspid stenosis. Aortic  Valve: The aortic valve was not well visualized. Aortic valve regurgitation is mild. Aortic regurgitation PHT measures 104 msec. Moderate aortic stenosis is present. Aortic valve mean gradient measures 23.0 mmHg. Aortic valve peak gradient measures 41.0 mmHg. Aortic valve area, by VTI measures 1.16 cm. Pulmonic Valve: The pulmonic valve was grossly normal.  Pulmonic valve regurgitation is not visualized. No evidence of pulmonic stenosis. Aorta: The aortic root and ascending aorta are structurally normal, with no evidence of dilitation. Venous: The inferior vena cava is normal in size with greater than 50% respiratory variability, suggesting right atrial pressure of 3 mmHg. IAS/Shunts: The atrial septum is grossly normal.  LEFT VENTRICLE PLAX 2D LVIDd:         5.00 cm      Diastology LVIDs:         3.50 cm      LV e' medial:    6.64 cm/s LV PW:         1.00 cm      LV E/e' medial:  14.8 LV IVS:        0.90 cm      LV e' lateral:   6.20 cm/s LVOT diam:     1.70 cm      LV E/e' lateral: 15.9 LV SV:         76 LV SV Index:   36 LVOT Area:     2.27 cm  LV Volumes (MOD) LV vol d, MOD A4C: 120.0 ml LV vol s, MOD A4C: 56.9 ml LV SV MOD A4C:     120.0 ml RIGHT VENTRICLE             IVC RV S prime:     10.80 cm/s  IVC diam: 1.30 cm TAPSE (M-mode): 2.1 cm LEFT ATRIUM             Index LA diam:        4.20 cm 2.01 cm/m LA Vol (A2C):   51.8 ml 24.78 ml/m LA Vol (A4C):   54.9 ml 26.26 ml/m LA Biplane Vol: 53.9 ml 25.78 ml/m  AORTIC VALVE AV Area (Vmax):    1.18 cm AV Area (Vmean):   1.14 cm AV Area (VTI):     1.16 cm AV Vmax:           320.00 cm/s AV Vmean:          227.000 cm/s AV VTI:            0.651 m AV Peak Grad:      41.0 mmHg AV Mean Grad:      23.0 mmHg LVOT Vmax:         166.50 cm/s LVOT Vmean:        114.500 cm/s LVOT VTI:          0.334 m LVOT/AV VTI ratio: 0.51 AI PHT:            104 msec  AORTA Ao Root diam: 2.70 cm Ao Asc diam:  3.70 cm MITRAL VALVE MV Area (PHT): 3.91 cm     SHUNTS MV Area VTI:   1.69 cm      Systemic VTI:  0.33 m MV Peak grad:  15.2 mmHg    Systemic Diam: 1.70 cm MV Mean grad:  6.5 mmHg MV Vmax:       1.95 m/s MV Vmean:      122.0 cm/s MV Decel Time: 194 msec MV E velocity: 98.50 cm/s MV A velocity: 165.00 cm/s MV E/A ratio:  0.60 Darryle Decent MD Electronically signed by Darryle Decent MD Signature Date/Time: 07/30/2024/1:56:17 PM    Final    CT Head Wo Contrast Result Date: 07/29/2024 EXAM: CT HEAD WITHOUT CONTRAST 07/29/2024 02:23:19 PM TECHNIQUE: CT of the head was performed without the administration of intravenous contrast. Automated exposure control, iterative reconstruction, and/or  weight based adjustment of the mA/kV was utilized to reduce the radiation dose to as low as reasonably achievable. COMPARISON: 10/20/2022 CLINICAL HISTORY: Mental status change, unknown cause FINDINGS: BRAIN AND VENTRICLES: No acute hemorrhage. No evidence of acute infarct. No hydrocephalus. No extra-axial collection. No mass effect or midline shift. Septum pellucidum is present. Atrophy and white matter changes are mildly advanced for age, stable. Atherosclerotic calcifications are present in the cavernous carotid arteries bilaterally. No hyperdense vessel is present. ORBITS: No acute abnormality. SINUSES: No acute abnormality. SOFT TISSUES AND SKULL: No acute soft tissue abnormality. No skull fracture. IMPRESSION: 1. No acute intracranial abnormality. 2. Mildly advanced atrophy and white matter changes for age, stable. Electronically signed by: Lonni Necessary MD 07/29/2024 02:37 PM EST RP Workstation: [id number]   DG Chest Port 1 View Result Date: 07/29/2024 EXAM: 1 VIEW(S) XRAY OF THE CHEST 07/29/2024 01:19:47 PM COMPARISON: 06/21/2024 CLINICAL HISTORY: Altered mental status. FINDINGS: LINES, TUBES AND DEVICES: Vascular stent in left arm. LUNGS AND PLEURA: Low lung volumes. Increased interstitial markings with differential diagnosis including pulmonary edema versus atypical infection. No pleural  effusion. No pneumothorax. HEART AND MEDIASTINUM: Atherosclerotic plaque. No acute abnormality of the cardiac and mediastinal silhouettes. BONES AND SOFT TISSUES: No acute osseous abnormality. IMPRESSION: 1. Increased interstitial markings, differential diagnosis includes pulmonary edema versus atypical infection. 2. Low lung volumes. Electronically signed by: Waddell Calk MD 07/29/2024 02:04 PM EST RP Workstation: [id number]    Review of Systems  HENT:  Negative for ear discharge, ear pain, hearing loss and tinnitus.   Eyes:  Negative for photophobia and pain.  Respiratory:  Negative for cough and shortness of breath.   Cardiovascular:  Negative for chest pain.  Gastrointestinal:  Negative for abdominal pain, nausea and vomiting.  Genitourinary:  Negative for dysuria, flank pain, frequency and urgency.  Musculoskeletal:  Positive for arthralgias (Bilateral knees). Negative for back pain, myalgias and neck pain.  Neurological:  Negative for dizziness and headaches.  Hematological:  Does not bruise/bleed easily.  Psychiatric/Behavioral:  The patient is not nervous/anxious.    Blood pressure (!) 151/67, pulse 82, temperature 97.6 F (36.4 C), resp. rate 18, height 5' 5 (1.651 m), weight 103.3 kg, SpO2 94%. Physical Exam Constitutional:      General: She is not in acute distress.    Appearance: She is well-developed. She is not diaphoretic.  HENT:     Head: Normocephalic and atraumatic.  Eyes:     General: No scleral icterus.       Right eye: No discharge.        Left eye: No discharge.     Conjunctiva/sclera: Conjunctivae normal.  Cardiovascular:     Rate and Rhythm: Normal rate and regular rhythm.  Pulmonary:     Effort: Pulmonary effort is normal. No respiratory distress.  Musculoskeletal:     Cervical back: Normal range of motion.  Skin:    General: Skin is warm and dry.  Neurological:     Mental Status: She is alert.  Psychiatric:        Mood and Affect: Mood normal.         Behavior: Behavior normal.     Assessment/Plan: Bilateral knee pain -- Needs TKA. I have encouraged her to make appt with Dr. Kay as her A1c is acceptable at this point.    Ozell DOROTHA Ned, PA-C Orthopedic Surgery [phone] 07/31/2024, 11:03 AM     [1]  Allergies Allergen Reactions   Shellfish Allergy Anaphylaxis and Swelling   Diazepam  Other (  See Comments)    felt like out of body experience   Morphine  Itching and Other (See Comments)    Tolerated hydromorphone  on 07/2020 *will take along with Benadryl *

## 2024-08-01 DIAGNOSIS — E875 Hyperkalemia: Secondary | ICD-10-CM | POA: Diagnosis not present

## 2024-08-01 DIAGNOSIS — R4182 Altered mental status, unspecified: Secondary | ICD-10-CM | POA: Diagnosis not present

## 2024-08-01 DIAGNOSIS — R7989 Other specified abnormal findings of blood chemistry: Secondary | ICD-10-CM | POA: Diagnosis not present

## 2024-08-01 DIAGNOSIS — N186 End stage renal disease: Secondary | ICD-10-CM | POA: Diagnosis not present

## 2024-08-01 LAB — HEMOGLOBIN A1C
Hgb A1c MFr Bld: 6 % — ABNORMAL HIGH (ref 4.8–5.6)
Mean Plasma Glucose: 125.5 mg/dL

## 2024-08-01 LAB — GLUCOSE, CAPILLARY
Glucose-Capillary: 204 mg/dL — ABNORMAL HIGH (ref 70–99)
Glucose-Capillary: 271 mg/dL — ABNORMAL HIGH (ref 70–99)
Glucose-Capillary: 268 mg/dL — ABNORMAL HIGH (ref 70–99)
Glucose-Capillary: 310 mg/dL — ABNORMAL HIGH (ref 70–99)

## 2024-08-01 MED ORDER — PENTAFLUOROPROP-TETRAFLUOROETH EX AERO: 1.0000 | INHALATION_SPRAY | CUTANEOUS | Status: DC | PRN

## 2024-08-01 MED ORDER — HEPARIN SODIUM (PORCINE) 1000 UNIT/ML IJ SOLN
INTRAMUSCULAR | Status: AC
  Filled 2024-08-01: qty 4

## 2024-08-01 MED ORDER — LIDOCAINE-PRILOCAINE 2.5-2.5 % EX CREA: 1.0000 | TOPICAL_CREAM | CUTANEOUS | Status: DC | PRN

## 2024-08-01 MED ORDER — HEPARIN SODIUM (PORCINE) 1000 UNIT/ML DIALYSIS: 1000.0000 [IU] | INTRAMUSCULAR | Status: DC | PRN

## 2024-08-01 MED ORDER — INSULIN ASPART 100 UNIT/ML IJ SOLN
2.0000 [IU] | Freq: Three times a day (TID) | INTRAMUSCULAR | Status: DC
  Administered 2024-08-01 – 2024-08-04 (×8): 2 [IU] via SUBCUTANEOUS
  Filled 2024-08-01 (×7): qty 2

## 2024-08-01 MED ORDER — LIDOCAINE HCL (PF) 1 % IJ SOLN: 5.0000 mL | INTRAMUSCULAR | Status: DC | PRN

## 2024-08-01 MED ORDER — ALTEPLASE 2 MG IJ SOLR: 2.0000 mg | Freq: Once | INTRAMUSCULAR | Status: DC | PRN

## 2024-08-01 MED ORDER — INSULIN GLARGINE 100 UNIT/ML ~~LOC~~ SOLN: 6.0000 [IU] | Freq: Every day | SUBCUTANEOUS | Status: DC

## 2024-08-01 NOTE — TOC Progression Note (Signed)
 Transition of Care [facility]) - Progression Note    Patient Details  Name: [name] MRN: [id number] Date of Birth: 03/19/1956  Transition of [facility]) CM/SW Contact  Lendia Dais, [place] Phone Number: 08/01/2024, 2:59 PM  Clinical Narrative: CSW has attempted to find bed availability for the pt. Multiple facilities have declined due to not being able to meet the pt's needs or out of network with insurance. Other facilities that are considering have no bed availability.     CSW spoke to the pt at bedside and informed her of denials and no bed availability from SNF's in the [place] area. CSW stated that they can continue to check in to monitor for beds available. Pt was agreeable.  CSW will continue to monitor.                      Expected Discharge Plan and Services         Expected Discharge Date: 07/30/24                                     Social Drivers of Health (SDOH) Interventions SDOH Screenings   Food Insecurity: No Food Insecurity (07/29/2024)  Housing: Low Risk (07/29/2024)  Transportation Needs: No Transportation Needs (07/29/2024)  Utilities: Not At Risk (07/29/2024)  Financial Resource Strain: Low Risk (12/01/2023)   Received from [facility]  Physical Activity: Inactive (12/01/2023)   Received from [facility]  Social Connections: Moderately Integrated (07/29/2024)  Stress: No Stress Concern Present (12/01/2023)   Received from [facility]  Tobacco Use: Medium Risk (07/30/2024)  Health Literacy: Adequate Health Literacy (12/01/2023)   Received from [facility]    Readmission Risk Interventions    03/07/2024    4:29 PM 11/01/2022    9:36 AM  Readmission Risk Prevention Plan  Transportation Screening Complete Complete  PCP or Specialist Appt within 3-5 Days  Complete  HRI or Home Care Consult Complete Complete  Social Work Consult for Recovery Care  Planning/Counseling Complete Complete  Palliative Care Screening Not Applicable Not Applicable  Medication Review Oceanographer) Complete Complete

## 2024-08-01 NOTE — TOC Progression Note (Signed)
 [facility]) - Progression Note    Patient Details  Name: [name] MRN: [id number] Date of Birth: 01-Dec-1955  Transition of Care [facility]) CM/SW Contact  Lendia Dais, [place] Phone Number: 08/01/2024, 11:12 AM  Clinical Narrative:  Pt has received one bed offer at [facility]. Per [facility] [place] there are no beds available today and will update once a bed available. CSW has contacted facilities pending/considering. Awaiting responses.  CSW will continue to monitor.                      Expected Discharge Plan and Services         Expected Discharge Date: 07/30/24                                     Social Drivers of Health (SDOH) Interventions SDOH Screenings   Food Insecurity: No Food Insecurity (07/29/2024)  Housing: Low Risk (07/29/2024)  Transportation Needs: No Transportation Needs (07/29/2024)  Utilities: Not At Risk (07/29/2024)  Financial Resource Strain: Low Risk (12/01/2023)   Received from [facility]  Physical Activity: Inactive (12/01/2023)   Received from [facility]  Social Connections: Moderately Integrated (07/29/2024)  Stress: No Stress Concern Present (12/01/2023)   Received from [facility]  Tobacco Use: Medium Risk (07/30/2024)  Health Literacy: Adequate Health Literacy (12/01/2023)   Received from [facility]    Readmission Risk Interventions    03/07/2024    4:29 PM 11/01/2022    9:36 AM  Readmission Risk Prevention Plan  Transportation Screening Complete Complete  PCP or Specialist Appt within 3-5 Days  Complete  HRI or Home Care Consult Complete Complete  Social Work Consult for Recovery Care Planning/Counseling Complete Complete  Palliative Care Screening Not Applicable Not Applicable  Medication Review Oceanographer) Complete Complete

## 2024-08-01 NOTE — Progress Notes (Signed)
 Pt receives out-pt HD at [facility] on TTS 10:55 am chair time. Will assist as needed.   Randine Mungo Dialysis Navigator [phone]

## 2024-08-01 NOTE — Progress Notes (Signed)
 "          [facility]                                                                              [name], is a 69 y.o. female, DOB - 1956/07/10, FMW:[id number] Admit date - 07/29/2024    Outpatient Primary MD for the patient is [facility] [employer]  LOS - 0  days  Chief Complaint  Patient presents with   Altered Mental Status       Brief summary   Patient is a 69 year old female with HTN, HLP, diabetes, neuropathy, CAD, QT prolongation, PAD, GERD, chronic diastolic CHF, ESRD on hemodialysis, depression, asthma, chronic pain, obesity, OSA, COPD presented with altered mental status.  Patient was reportedly at her baseline on the morning of admission including when her daughter got her in the car to take her to the dialysis.  However on arrival at the dialysis center, patient slumped and was less responsive.  EMS was called.  CBG 199 and continued to be slow to respond.  No focal neurological deficits.  Patient was brought to ED. Chest x-Halleck showed increased interstitial markings which could represent edema versus atypical infection. CT head showed no acute abnormality.    Assessment & Plan    Acute toxic encephalopathy likely due to polypharmacy - On scheduled oxycodone , lyrica , tizanidine , Atarax , sertraline . As her symptoms improved postdialysis suggest possible polypharmacy. -TSH 0.8, lactic acid mildly elevated 2.4, improved to 1.1.  Procalcitonin 0.72, troponin 216 - Unfortunately, as pain medication regimen was adjusted, patient started having intractable pain and unable to stand up, hence discharge canceled on 9/18. - Currently pain controlled with current regimen, oxycodone  scheduled, decrease to 10mg  TID, Lyrica  75 mg twice daily, continue lidocaine  patch, Flexeril , Cymbalta  30 mg daily, Voltaren  gel -Continue to monitor closely for any acute encephalopathy, respiratory depression or excessive sedation.  Patient needs to follow-up with the pain specialist for pain  management outpatient.     ESRD on hemodialysis, TTS Chronic diastolic CHF -Received hemodialysis with improvement in her mental status, suggesting polypharmacy -Continue volume management with HD  Severe bilateral knee pain - Due to severe osteoarthritis, follows orthopedics outpatient, Dr. Kay - Requested orthopedic evaluation, patient has end-stage severe osteoarthritis, cortisone injections is not effective, recommended short-term prednisone  60 mg daily for 5 days - Patient was recommended to follow-up with Dr. Kay as her A1c is now acceptable at this point and needs TKA.    Elevated troponin possibly demand ischemia CAD No complaints of chest pain, acutely.  There was a concern for possible syncopal event hence cardiology was consulted. Seen by cardiology, Dr Ardelle, recommended 2D echo for further evaluation - Continue ASA, atorvastatin  - 2D echo showed EF 65-70%, moderate AS, has outpatient follow-up scheduled. Cleared by cardiology to discharge home. Event monitor will be mailed to the patient.    Diarrhea - Resolved  -patient reported diarrhea for 1 month however improved while inpatient.  C. difficile was ordered.   Hypertension - Continue Coreg  6.25 mg twice daily   Hyperlipidemia - Continue home statin   Diabetes mellitus, type II, uncontrolled with hyperglycemia  CBG (last 3)  Recent Labs  07/31/24 1641 07/31/24 2028 08/01/24 0728  GLUCAP 207* 229* 204*   CBGs elevated likely due to prednisone  -Continue sliding scale insulin , added NovoLog  2 units 3 times daily AC.  Hemoglobin A1c 6.0.   Neuropathy - Continue Lyrica  75 mg twice daily   PAD - Continue home ASA, atorvastatin    GERD - Continue PPI     OSA - Continue CPAP   Chronic hepatitis C - Noted - LFTs stable   Anemia - Stable at 11.1 - Trend CBC   Obesity class I Estimated body mass index is 39.84 kg/m as calculated from the following:   Height as of this encounter: 5' 5  (1.651 m).   Weight as of this encounter: 108.6 kg.  Code Status: Full code DVT Prophylaxis:  heparin  injection 5,000 Units Start: 07/29/24 2200   Level of Care: Level of care: Telemetry Family Communication: Updated patient Disposition Plan:      Remains inpatient appropriate: Awaiting SNF  Procedures:    Consultants:   Orthopedics Nephrology  Antimicrobials:   Anti-infectives (From admission, onward)    Start     Dose/Rate Route Frequency Ordered Stop   07/29/24 1345  ceFEPIme  (MAXIPIME ) 2 g in sodium chloride  0.9 % 100 mL IVPB        2 g 200 mL/hr over 30 Minutes Intravenous  Once 07/29/24 1333 07/29/24 1434   07/29/24 1345  metroNIDAZOLE  (FLAGYL ) IVPB 500 mg        500 mg 100 mL/hr over 60 Minutes Intravenous  Once 07/29/24 1333 07/30/24 1559   07/29/24 1345  vancomycin  (VANCOCIN ) IVPB 1000 mg/200 mL premix  Status:  Discontinued        1,000 mg 200 mL/hr over 60 Minutes Intravenous  Once 07/29/24 1333 07/29/24 1342   07/29/24 1345  vancomycin  (VANCOREADY) IVPB 2000 mg/400 mL        2,000 mg 200 mL/hr over 120 Minutes Intravenous  Once 07/29/24 1342 07/30/24 1523          Medications  aspirin  EC  81 mg Oral Daily   atorvastatin   80 mg Oral QHS   carvedilol   6.25 mg Oral BID   Chlorhexidine  Gluconate Cloth  6 each Topical Q0600   cyclobenzaprine   5 mg Oral TID   diclofenac  Sodium  4 g Topical QID   DULoxetine   30 mg Oral Daily   heparin   2,500 Units Dialysis Once in dialysis   heparin   5,000 Units Subcutaneous Q8H   insulin  aspart  0-5 Units Subcutaneous QHS   insulin  aspart  0-9 Units Subcutaneous TID WC   lidocaine   2 patch Transdermal Daily   oxyCODONE   10 mg Oral TID   pantoprazole   40 mg Oral Daily   predniSONE   60 mg Oral QAC breakfast   pregabalin   75 mg Oral BID   sodium chloride  flush  3 mL Intravenous Q12H      Subjective:   [name] was seen and examined today.  Pain appears to be somewhat controlled, no nausea vomiting, chest pain or  shortness of breath.  Awaiting SNF.   Objective:   Vitals:   08/01/24 0930 08/01/24 1000 08/01/24 1030 08/01/24 1100  BP: (!) 138/104 (!) 120/52 122/89 129/68  Pulse: 82 82 85 83  Resp: 15 10 17 13   Temp:      TempSrc:      SpO2: 96% 93% 92% 96%  Weight:      Height:        Intake/Output Summary (Last 24 hours)  at 08/01/2024 1111 Last data filed at 07/31/2024 1900 Gross per 24 hour  Intake 600 ml  Output --  Net 600 ml     Wt Readings from Last 3 Encounters:  08/01/24 108.6 kg  06/21/24 104 kg  06/01/24 104.3 kg   Physical Exam General: Alert and oriented x 3, NAD Cardiovascular: S1 S2 clear, RRR.  Respiratory: CTAB, no wheezing, rales or rhonchi Gastrointestinal: Soft, nontender, nondistended, NBS Ext: no pedal edema bilaterally Neuro: no new deficits Psych: Normal affect     Data Reviewed:  I have personally reviewed following labs    CBC Lab Results  Component Value Date   WBC 6.5 07/30/2024   RBC 4.35 07/30/2024   HGB 12.1 07/30/2024   HCT 38.9 07/30/2024   MCV 89.4 07/30/2024   MCH 27.8 07/30/2024   PLT 142 (L) 07/30/2024   MCHC 31.1 07/30/2024   RDW 16.4 (H) 07/30/2024   LYMPHSABS 1.5 07/29/2024   MONOABS 0.6 07/29/2024   EOSABS 0.2 07/29/2024   BASOSABS 0.0 07/29/2024     Last metabolic panel Lab Results  Component Value Date   NA 135 07/30/2024   K 4.2 07/30/2024   CL 93 (L) 07/30/2024   CO2 26 07/30/2024   BUN 22 07/30/2024   CREATININE 4.38 (H) 07/30/2024   GLUCOSE 133 (H) 07/30/2024   GFRNONAA 11 (L) 07/30/2024   GFRAA 6 (L) 01/31/2020   CALCIUM  10.0 07/30/2024   PHOS 4.2 06/02/2024   PROT 7.0 07/30/2024   ALBUMIN  4.1 07/30/2024   LABGLOB 2.8 09/27/2020   AGRATIO 1.4 09/27/2020   BILITOT 0.3 07/30/2024   ALKPHOS 273 (H) 07/30/2024   AST 23 07/30/2024   ALT 22 07/30/2024   ANIONGAP 16 (H) 07/30/2024    CBG (last 3)  Recent Labs    07/31/24 1641 07/31/24 2028 08/01/24 0728  GLUCAP 207* 229* 204*      Coagulation  Profile: No results for input(s): INR, PROTIME in the last 168 hours.   Radiology Studies: I have personally reviewed the imaging studies  ECHOCARDIOGRAM COMPLETE Result Date: 07/30/2024    ECHOCARDIOGRAM REPORT   Patient Name:   [name] Date of Exam: 07/30/2024 Medical Rec #:  [id number]     Height:       65.0 in Accession #:    [id number]    Weight:       227.7 lb Date of Birth:  April 05, 1956     BSA:          2.090 m Patient Age:    68 years      BP:           125/44 mmHg Patient Gender: F             HR:           86 bpm. Exam Location:  Inpatient Procedure: 2D Echo, Cardiac Doppler, Color Doppler and PEDOF (Both Spectral and            Color Flow Doppler were utilized during procedure). Indications:    Aortic Stenosis I35.0  History:        Patient has prior history of Echocardiogram examinations, most                 recent 03/04/2024. CHF, CAD and Previous Myocardial Infarction,                 COPD, Aortic Valve Disease; Risk Factors:Dyslipidemia,  Hypertension, Former Smoker, Diabetes, Sleep Apnea and ESRD,                 GERD.  Sonographer:    Koleen Popper RDCS Referring Phys: [name]  Sonographer Comments: Image acquisition challenging due to patient body habitus. IMPRESSIONS  1. Left ventricular ejection fraction, by estimation, is 65 to 70%. The left ventricle has normal function. The left ventricle has no regional wall motion abnormalities. Left ventricular diastolic parameters are consistent with Grade I diastolic dysfunction (impaired relaxation).  2. Right ventricular systolic function is normal. The right ventricular size is normal. Tricuspid regurgitation signal is inadequate for assessing PA pressure.  3. The mitral valve is degenerative. Trivial mitral valve regurgitation. Mild to moderate mitral stenosis. The mean mitral valve gradient is 6.5 mmHg with average heart rate of 86 bpm. Severe mitral annular calcification.  4. The aortic valve was not well  visualized. Aortic valve regurgitation is mild. Moderate aortic valve stenosis. Aortic valve area, by VTI measures 1.16 cm. Aortic valve mean gradient measures 23.0 mmHg. Aortic valve Vmax measures 3.20 m/s.  5. The inferior vena cava is normal in size with greater than 50% respiratory variability, suggesting right atrial pressure of 3 mmHg. Comparison(s): No significant change from prior study. FINDINGS  Left Ventricle: Left ventricular ejection fraction, by estimation, is 65 to 70%. The left ventricle has normal function. The left ventricle has no regional wall motion abnormalities. The left ventricular internal cavity size was normal in size. There is  no left ventricular hypertrophy. Left ventricular diastolic parameters are consistent with Grade I diastolic dysfunction (impaired relaxation). Right Ventricle: The right ventricular size is normal. No increase in right ventricular wall thickness. Right ventricular systolic function is normal. Tricuspid regurgitation signal is inadequate for assessing PA pressure. Left Atrium: Left atrial size was normal in size. Right Atrium: Right atrial size was normal in size. Pericardium: There is no evidence of pericardial effusion. Mitral Valve: The mitral valve is degenerative in appearance. Severe mitral annular calcification. Trivial mitral valve regurgitation. Mild to moderate mitral valve stenosis. MV peak gradient, 15.2 mmHg. The mean mitral valve gradient is 6.5 mmHg with average heart rate of 86 bpm. Tricuspid Valve: The tricuspid valve is grossly normal. Tricuspid valve regurgitation is trivial. No evidence of tricuspid stenosis. Aortic Valve: The aortic valve was not well visualized. Aortic valve regurgitation is mild. Aortic regurgitation PHT measures 104 msec. Moderate aortic stenosis is present. Aortic valve mean gradient measures 23.0 mmHg. Aortic valve peak gradient measures 41.0 mmHg. Aortic valve area, by VTI measures 1.16 cm. Pulmonic Valve: The pulmonic  valve was grossly normal. Pulmonic valve regurgitation is not visualized. No evidence of pulmonic stenosis. Aorta: The aortic root and ascending aorta are structurally normal, with no evidence of dilitation. Venous: The inferior vena cava is normal in size with greater than 50% respiratory variability, suggesting right atrial pressure of 3 mmHg. IAS/Shunts: The atrial septum is grossly normal.  LEFT VENTRICLE PLAX 2D LVIDd:         5.00 cm      Diastology LVIDs:         3.50 cm      LV e' medial:    6.64 cm/s LV PW:         1.00 cm      LV E/e' medial:  14.8 LV IVS:        0.90 cm      LV e' lateral:   6.20 cm/s LVOT diam:  1.70 cm      LV E/e' lateral: 15.9 LV SV:         76 LV SV Index:   36 LVOT Area:     2.27 cm  LV Volumes (MOD) LV vol d, MOD A4C: 120.0 ml LV vol s, MOD A4C: 56.9 ml LV SV MOD A4C:     120.0 ml RIGHT VENTRICLE             IVC RV S prime:     10.80 cm/s  IVC diam: 1.30 cm TAPSE (M-mode): 2.1 cm LEFT ATRIUM             Index LA diam:        4.20 cm 2.01 cm/m LA Vol (A2C):   51.8 ml 24.78 ml/m LA Vol (A4C):   54.9 ml 26.26 ml/m LA Biplane Vol: 53.9 ml 25.78 ml/m  AORTIC VALVE AV Area (Vmax):    1.18 cm AV Area (Vmean):   1.14 cm AV Area (VTI):     1.16 cm AV Vmax:           320.00 cm/s AV Vmean:          227.000 cm/s AV VTI:            0.651 m AV Peak Grad:      41.0 mmHg AV Mean Grad:      23.0 mmHg LVOT Vmax:         166.50 cm/s LVOT Vmean:        114.500 cm/s LVOT VTI:          0.334 m LVOT/AV VTI ratio: 0.51 AI PHT:            104 msec  AORTA Ao Root diam: 2.70 cm Ao Asc diam:  3.70 cm MITRAL VALVE MV Area (PHT): 3.91 cm     SHUNTS MV Area VTI:   1.69 cm     Systemic VTI:  0.33 m MV Peak grad:  15.2 mmHg    Systemic Diam: 1.70 cm MV Mean grad:  6.5 mmHg MV Vmax:       1.95 m/s MV Vmean:      122.0 cm/s MV Decel Time: 194 msec MV E velocity: 98.50 cm/s MV A velocity: 165.00 cm/s MV E/A ratio:  0.60 Heather Decent MD Electronically signed by Heather Decent MD Signature Date/Time:  07/30/2024/1:56:17 PM    Final        [name] M.D. [facility] 08/01/2024, 11:11 AM  Available via Epic secure chat 7am-7pm After 7 pm, please refer to night coverage provider listed on amion.    "

## 2024-08-01 NOTE — Progress Notes (Signed)
 " [facility] Progress Note   Subjective:    Seen and examined patient on HD. Tolerating UFG 1.5L and BP is 114/85. Reports ongoing knee pain. Denies SOB, CP, and N/V. Noted PT recommended SNF placement.  Objective Vitals:   08/01/24 0811 08/01/24 0820 08/01/24 0900 08/01/24 0930  BP: (!) 147/125 118/81 118/73 (!) 138/104  Pulse: 82 82 81 82  Resp: 11 (!) 22 12 15   Temp: 98.3 F (36.8 C)     TempSrc: Oral     SpO2: 93% 93% 93% 96%  Weight: 108.6 kg     Height:       Physical Exam General: Alert female in NAD Heart: RRR, no murmur Lungs: CTA bilaterally, respirations unlabored Abdomen: Soft and non-distended  Extremities: No edema b/l lower extremities Dialysis Access:  AVF + t/b  [facility] Weights   07/29/24 2004 07/29/24 2117 08/01/24 0811  Weight: 101.4 kg 103.3 kg 108.6 kg    Intake/Output Summary (Last 24 hours) at 08/01/2024 1000 Last data filed at 07/31/2024 1900 Gross per 24 hour  Intake 600 ml  Output --  Net 600 ml    Additional Objective Labs: Basic Metabolic Panel: Recent Labs  Lab 07/29/24 1250 07/29/24 1320 07/30/24 0406  NA 136 135 135  K 5.5* 5.1 4.2  CL 93*  --  93*  CO2 25  --  26  GLUCOSE 236*  --  133*  BUN 40*  --  22  CREATININE 6.98*  --  4.38*  CALCIUM  10.1  --  10.0   Liver Function Tests: Recent Labs  Lab 07/29/24 1250 07/30/24 0406  AST 26 23  ALT 27 22  ALKPHOS 270* 273*  BILITOT 0.3 0.3  PROT 6.9 7.0  ALBUMIN  4.1 4.1   No results for input(s): LIPASE, AMYLASE in the last 168 hours. CBC: Recent Labs  Lab 07/29/24 1320 07/29/24 1400 07/30/24 0406  WBC  --  6.9 6.5  NEUTROABS  --  4.6  --   HGB 12.2 11.1* 12.1  HCT 36.0 37.7 38.9  MCV  --  91.7 89.4  PLT  --  160 142*   Blood Culture    Component Value Date/Time   SDES BLOOD RIGHT ARM 07/29/2024 1400   SPECREQUEST  07/29/2024 1400    BOTTLES DRAWN AEROBIC AND ANAEROBIC Blood Culture adequate volume   CULT  07/29/2024 1400    NO GROWTH 3  DAYS Performed at [facility], [address] [place], [place] [address]    REPTSTATUS PENDING 07/29/2024 1400    Cardiac Enzymes: No results for input(s): CKTOTAL, CKMB, CKMBINDEX, TROPONINI in the last 168 hours. CBG: Recent Labs  Lab 07/31/24 0731 07/31/24 1120 07/31/24 1641 07/31/24 2028 08/01/24 0728  GLUCAP 160* 175* 207* 229* 204*   Iron  Studies: No results for input(s): IRON , TIBC, TRANSFERRIN, FERRITIN in the last 72 hours. Lab Results  Component Value Date   INR 1.1 06/01/2024   INR 1.1 04/14/2022   INR 1.1 09/17/2020   Studies/Results: ECHOCARDIOGRAM COMPLETE Result Date: 07/30/2024    ECHOCARDIOGRAM REPORT   Patient Name:   ERICHA WHITTINGHAM Date of Exam: 07/30/2024 Medical Rec #:  [id number]     Height:       65.0 in Accession #:    [id number]    Weight:       227.7 lb Date of Birth:  [birth year]     BSA:          2.090 m Patient Age:  68 years      BP:           125/44 mmHg Patient Gender: F             HR:           86 bpm. Exam Location:  Inpatient Procedure: 2D Echo, Cardiac Doppler, Color Doppler and PEDOF (Both Spectral and            Color Flow Doppler were utilized during procedure). Indications:    Aortic Stenosis I35.0  History:        Patient has prior history of Echocardiogram examinations, most                 recent 03/04/2024. CHF, CAD and Previous Myocardial Infarction,                 COPD, Aortic Valve Disease; Risk Factors:Dyslipidemia,                 Hypertension, Former Smoker, Diabetes, Sleep Apnea and ESRD,                 GERD.  Sonographer:    Koleen Popper RDCS Referring Phys: WADDELL DELENA DONATH  Sonographer Comments: Image acquisition challenging due to patient body habitus. IMPRESSIONS  1. Left ventricular ejection fraction, by estimation, is 65 to 70%. The left ventricle has normal function. The left ventricle has no regional wall motion abnormalities. Left ventricular diastolic parameters are consistent with Grade I diastolic  dysfunction (impaired relaxation).  2. Right ventricular systolic function is normal. The right ventricular size is normal. Tricuspid regurgitation signal is inadequate for assessing PA pressure.  3. The mitral valve is degenerative. Trivial mitral valve regurgitation. Mild to moderate mitral stenosis. The mean mitral valve gradient is 6.5 mmHg with average heart rate of 86 bpm. Severe mitral annular calcification.  4. The aortic valve was not well visualized. Aortic valve regurgitation is mild. Moderate aortic valve stenosis. Aortic valve area, by VTI measures 1.16 cm. Aortic valve mean gradient measures 23.0 mmHg. Aortic valve Vmax measures 3.20 m/s.  5. The inferior vena cava is normal in size with greater than 50% respiratory variability, suggesting right atrial pressure of 3 mmHg. Comparison(s): No significant change from prior study. FINDINGS  Left Ventricle: Left ventricular ejection fraction, by estimation, is 65 to 70%. The left ventricle has normal function. The left ventricle has no regional wall motion abnormalities. The left ventricular internal cavity size was normal in size. There is  no left ventricular hypertrophy. Left ventricular diastolic parameters are consistent with Grade I diastolic dysfunction (impaired relaxation). Right Ventricle: The right ventricular size is normal. No increase in right ventricular wall thickness. Right ventricular systolic function is normal. Tricuspid regurgitation signal is inadequate for assessing PA pressure. Left Atrium: Left atrial size was normal in size. Right Atrium: Right atrial size was normal in size. Pericardium: There is no evidence of pericardial effusion. Mitral Valve: The mitral valve is degenerative in appearance. Severe mitral annular calcification. Trivial mitral valve regurgitation. Mild to moderate mitral valve stenosis. MV peak gradient, 15.2 mmHg. The mean mitral valve gradient is 6.5 mmHg with average heart rate of 86 bpm. Tricuspid Valve: The  tricuspid valve is grossly normal. Tricuspid valve regurgitation is trivial. No evidence of tricuspid stenosis. Aortic Valve: The aortic valve was not well visualized. Aortic valve regurgitation is mild. Aortic regurgitation PHT measures 104 msec. Moderate aortic stenosis is present. Aortic valve mean gradient measures 23.0 mmHg. Aortic valve peak gradient measures  41.0 mmHg. Aortic valve area, by VTI measures 1.16 cm. Pulmonic Valve: The pulmonic valve was grossly normal. Pulmonic valve regurgitation is not visualized. No evidence of pulmonic stenosis. Aorta: The aortic root and ascending aorta are structurally normal, with no evidence of dilitation. Venous: The inferior vena cava is normal in size with greater than 50% respiratory variability, suggesting right atrial pressure of 3 mmHg. IAS/Shunts: The atrial septum is grossly normal.  LEFT VENTRICLE PLAX 2D LVIDd:         5.00 cm      Diastology LVIDs:         3.50 cm      LV e' medial:    6.64 cm/s LV PW:         1.00 cm      LV E/e' medial:  14.8 LV IVS:        0.90 cm      LV e' lateral:   6.20 cm/s LVOT diam:     1.70 cm      LV E/e' lateral: 15.9 LV SV:         76 LV SV Index:   36 LVOT Area:     2.27 cm  LV Volumes (MOD) LV vol d, MOD A4C: 120.0 ml LV vol s, MOD A4C: 56.9 ml LV SV MOD A4C:     120.0 ml RIGHT VENTRICLE             IVC RV S prime:     10.80 cm/s  IVC diam: 1.30 cm TAPSE (M-mode): 2.1 cm LEFT ATRIUM             Index LA diam:        4.20 cm 2.01 cm/m LA Vol (A2C):   51.8 ml 24.78 ml/m LA Vol (A4C):   54.9 ml 26.26 ml/m LA Biplane Vol: 53.9 ml 25.78 ml/m  AORTIC VALVE AV Area (Vmax):    1.18 cm AV Area (Vmean):   1.14 cm AV Area (VTI):     1.16 cm AV Vmax:           320.00 cm/s AV Vmean:          227.000 cm/s AV VTI:            0.651 m AV Peak Grad:      41.0 mmHg AV Mean Grad:      23.0 mmHg LVOT Vmax:         166.50 cm/s LVOT Vmean:        114.500 cm/s LVOT VTI:          0.334 m LVOT/AV VTI ratio: 0.51 AI PHT:            104 msec   AORTA Ao Root diam: 2.70 cm Ao Asc diam:  3.70 cm MITRAL VALVE MV Area (PHT): 3.91 cm     SHUNTS MV Area VTI:   1.69 cm     Systemic VTI:  0.33 m MV Peak grad:  15.2 mmHg    Systemic Diam: 1.70 cm MV Mean grad:  6.5 mmHg MV Vmax:       1.95 m/s MV Vmean:      122.0 cm/s MV Decel Time: 194 msec MV E velocity: 98.50 cm/s MV A velocity: 165.00 cm/s MV E/A ratio:  0.60 Darryle Decent MD Electronically signed by Darryle Decent MD Signature Date/Time: 07/30/2024/1:56:17 PM    Final     Medications:  anticoagulant sodium citrate       aspirin  EC  81 mg  Oral Daily   atorvastatin   80 mg Oral QHS   carvedilol   6.25 mg Oral BID   Chlorhexidine  Gluconate Cloth  6 each Topical Q0600   cyclobenzaprine   5 mg Oral TID   diclofenac  Sodium  4 g Topical QID   DULoxetine   30 mg Oral Daily   heparin   2,500 Units Dialysis Once in dialysis   heparin   5,000 Units Subcutaneous Q8H   insulin  aspart  0-5 Units Subcutaneous QHS   insulin  aspart  0-9 Units Subcutaneous TID WC   lidocaine   2 patch Transdermal Daily   oxyCODONE   10 mg Oral TID   pantoprazole   40 mg Oral Daily   predniSONE   60 mg Oral QAC breakfast   pregabalin   75 mg Oral BID   sodium chloride  flush  3 mL Intravenous Q12H    Dialysis Orders: Center: [facility]  on TTS . 180NRe Time: 4 hr 30 min BFR 450 DFR A 1.5 EDW 102.5kg 2K 2.5Ca AVF 15g Heparin  3000 unit bolus  Mircera 225mcg IV q 2 weeks Hectorol  8mcg IV q HD Sensipar  30mg  PO q HD  Assessment/Plan: AMS: Reportedly began abruptly upon arrival to HD. Work up ongoing per primary team. Does not appear to be uremic based on labs. Does report taking lyrica  and a muscle relaxer pre HD, improvement post dialysis suggests possible polypharmacy  ESRD:  On TTS schedule. On HD. Hypertension/volume: Mild volume overload, continue to titrate down volume. Now under EDW here. Lower EDW at discharge. Anemia: Hgb 12.1. No ESA indicated Metabolic bone disease: Calcium  slightly elevated, no VDRA for now.  Follow phos Severe b/l knee pain: Followed by Ortho in outpatient Nutrition:  Renal diet with fluid restrictions Diarrhea: reports ongoing for 1 month, per primary team  Charmaine Piety, NP [facility] 08/01/2024,10:00 AM  LOS: 0 days    "

## 2024-08-01 NOTE — Plan of Care (Signed)

## 2024-08-01 NOTE — Inpatient Diabetes Management (Signed)
 Inpatient Diabetes Program Recommendations  AACE/ADA: New Consensus Statement on Inpatient Glycemic Control (2015)  Target Ranges:  Prepandial:   less than 140 mg/dL      Peak postprandial:   less than 180 mg/dL (1-2 hours)      Critically ill patients:  140 - 180 mg/dL   Lab Results  Component Value Date   GLUCAP 204 (H) 08/01/2024   HGBA1C 6.0 (H) 08/01/2024    Review of Glycemic Control  Latest Reference Range & Units 07/30/24 20:39 07/31/24 07:31 07/31/24 11:20 07/31/24 16:41 07/31/24 20:28 08/01/24 07:28  Glucose-Capillary 70 - 99 mg/dL 681 (H) 839 (H) 824 (H) 207 (H) 229 (H) 204 (H)   Diabetes history: DM 2 Outpatient Diabetes medications:  Ozempic 8 mg daily- takes on Sundays Current orders for Inpatient glycemic control:  Novolog  0-9 units tid with meals and HS Prednisone  60 mg q AM  Inpatient Diabetes Program Recommendations:    While on prednisone , consider adding Novolog  2 units tid with meals (hold if patient eats less than 50% or NPO).  Thanks,  Randall Bullocks, RN, BC-ADM Inpatient Diabetes Coordinator Pager [phone]  (8a-5p)

## 2024-08-02 DIAGNOSIS — G9341 Metabolic encephalopathy: Secondary | ICD-10-CM

## 2024-08-02 LAB — CBC
HCT: 40.5 % (ref 36.0–46.0)
Hemoglobin: 12.8 g/dL (ref 12.0–15.0)
MCH: 27.8 pg (ref 26.0–34.0)
MCHC: 31.6 g/dL (ref 30.0–36.0)
MCV: 88 fL (ref 80.0–100.0)
Platelets: 152 K/uL (ref 150–400)
RBC: 4.6 MIL/uL (ref 3.87–5.11)
RDW: 16.4 % — ABNORMAL HIGH (ref 11.5–15.5)
WBC: 8.5 K/uL (ref 4.0–10.5)
nRBC: 0.5 % — ABNORMAL HIGH (ref 0.0–0.2)

## 2024-08-02 LAB — GLUCOSE, CAPILLARY
Glucose-Capillary: 275 mg/dL — ABNORMAL HIGH (ref 70–99)
Glucose-Capillary: 302 mg/dL — ABNORMAL HIGH (ref 70–99)
Glucose-Capillary: 336 mg/dL — ABNORMAL HIGH (ref 70–99)
Glucose-Capillary: 328 mg/dL — ABNORMAL HIGH (ref 70–99)
Glucose-Capillary: 352 mg/dL — ABNORMAL HIGH (ref 70–99)

## 2024-08-02 LAB — BASIC METABOLIC PANEL WITH GFR
Anion gap: 17 — ABNORMAL HIGH (ref 5–15)
BUN: 44 mg/dL — ABNORMAL HIGH (ref 8–23)
CO2: 27 mmol/L (ref 22–32)
Calcium: 9.8 mg/dL (ref 8.9–10.3)
Chloride: 89 mmol/L — ABNORMAL LOW (ref 98–111)
Creatinine, Ser: 5.87 mg/dL — ABNORMAL HIGH (ref 0.44–1.00)
GFR, Estimated: 7 mL/min — ABNORMAL LOW
Glucose, Bld: 241 mg/dL — ABNORMAL HIGH (ref 70–99)
Potassium: 5.1 mmol/L (ref 3.5–5.1)
Sodium: 132 mmol/L — ABNORMAL LOW (ref 135–145)

## 2024-08-02 MED ORDER — INSULIN GLARGINE 100 UNIT/ML ~~LOC~~ SOLN
10.0000 [IU] | Freq: Once | SUBCUTANEOUS | Status: AC
  Administered 2024-08-02: 10 [IU] via SUBCUTANEOUS
  Filled 2024-08-02 (×2): qty 0.1

## 2024-08-02 MED ORDER — INSULIN GLARGINE 100 UNIT/ML ~~LOC~~ SOLN
15.0000 [IU] | SUBCUTANEOUS | Status: DC
  Filled 2024-08-02: qty 0.15

## 2024-08-02 MED ORDER — CHLORHEXIDINE GLUCONATE CLOTH 2 % EX PADS: 6.0000 | MEDICATED_PAD | Freq: Every day | CUTANEOUS | Status: DC

## 2024-08-02 MED ORDER — INSULIN GLARGINE 100 UNIT/ML ~~LOC~~ SOLN
8.0000 [IU] | SUBCUTANEOUS | Status: DC
  Administered 2024-08-02: 8 [IU] via SUBCUTANEOUS
  Filled 2024-08-02: qty 0.08

## 2024-08-02 NOTE — Inpatient Diabetes Management (Addendum)
 Inpatient Diabetes Program Recommendations  AACE/ADA: New Consensus Statement on Inpatient Glycemic Control (2015)  Target Ranges:  Prepandial:   less than 140 mg/dL      Peak postprandial:   less than 180 mg/dL (1-2 hours)      Critically ill patients:  140 - 180 mg/dL   Lab Results  Component Value Date   GLUCAP 275 (H) 08/02/2024   HGBA1C 6.0 (H) 08/01/2024    Review of Glycemic Control  Latest Reference Range & Units 08/01/24 16:45 08/01/24 21:02 08/02/24 08:47  Glucose-Capillary 70 - 99 mg/dL 731 (H) 689 (H) 724 (H)   Diabetes history: DM 2 Outpatient Diabetes medications:  Ozempic 8 mg daily- takes on Sundays Current orders for Inpatient glycemic control:  Novolog  2 units tid with meals  Novolog  0-9 units tid with meals and HS Prednisone  60 mg q AM  Inpatient Diabetes Program Recommendations:    Consider adding Lantus  8 units daily and consider increasing Novolog  to 3 units tid with meals (hold if patient eats less than 50% or NPO) while on steroids.   Thanks,  Randall Bullocks, RN, BC-ADM Inpatient Diabetes Coordinator Pager [phone]  (8a-5p)

## 2024-08-02 NOTE — Progress Notes (Signed)
 Pt BG continues to increase throughout shift. Dr. Dorinda made aware. She has had family visit and they bring fast food ([employer], [employer]). I did counsel pt regarding food intake/dietary restriction and how current steroids are likely elevating BG. Will continue to monitor.

## 2024-08-02 NOTE — Progress Notes (Signed)
 " [facility] Progress Note   Subjective:    Seen and examined patient in her room. She's sitting up in the recliner. No acute issues. Tolerated yesterday's HD with net UF 1.5L. Next HD 1/22. Awaiting SNF placement.  Objective Vitals:   08/01/24 1643 08/01/24 2015 08/02/24 0851 08/02/24 1621  BP: (!) 139/49 (!) 134/56 (!) 136/53 (!) 155/69  Pulse: 81 85 77 72  Resp: 18 18 18 18   Temp: 98.5 F (36.9 C) 99.3 F (37.4 C) 98 F (36.7 C) 98 F (36.7 C)  TempSrc:  Oral    SpO2: 97% 96% 98% 95%  Weight:      Height:       Physical Exam General: Alert female in NAD Heart: RRR, no murmur Lungs: CTA bilaterally, respirations unlabored Abdomen: Soft and non-distended  Extremities: No edema b/l lower extremities Dialysis Access:  AVF + t/b  [facility] Weights   07/29/24 2117 08/01/24 0811 08/01/24 1330  Weight: 103.3 kg 108.6 kg 107.1 kg   No intake or output data in the 24 hours ending 08/02/24 1750  Additional Objective Labs: Basic Metabolic Panel: Recent Labs  Lab 07/29/24 1250 07/29/24 1320 07/30/24 0406 08/02/24 0342  NA 136 135 135 132*  K 5.5* 5.1 4.2 5.1  CL 93*  --  93* 89*  CO2 25  --  26 27  GLUCOSE 236*  --  133* 241*  BUN 40*  --  22 44*  CREATININE 6.98*  --  4.38* 5.87*  CALCIUM  10.1  --  10.0 9.8   Liver Function Tests: Recent Labs  Lab 07/29/24 1250 07/30/24 0406  AST 26 23  ALT 27 22  ALKPHOS 270* 273*  BILITOT 0.3 0.3  PROT 6.9 7.0  ALBUMIN  4.1 4.1   No results for input(s): LIPASE, AMYLASE in the last 168 hours. CBC: Recent Labs  Lab 07/29/24 1400 07/30/24 0406 08/02/24 0342  WBC 6.9 6.5 8.5  NEUTROABS 4.6  --   --   HGB 11.1* 12.1 12.8  HCT 37.7 38.9 40.5  MCV 91.7 89.4 88.0  PLT 160 142* 152   Blood Culture    Component Value Date/Time   SDES BLOOD RIGHT ARM 07/29/2024 1400   SPECREQUEST  07/29/2024 1400    BOTTLES DRAWN AEROBIC AND ANAEROBIC Blood Culture adequate volume   CULT  07/29/2024 1400    NO  GROWTH 4 DAYS Performed at [facility], [address], [place], [place] [address]    REPTSTATUS PENDING 07/29/2024 1400    Cardiac Enzymes: No results for input(s): CKTOTAL, CKMB, CKMBINDEX, TROPONINI in the last 168 hours. CBG: Recent Labs  Lab 08/01/24 2102 08/02/24 0847 08/02/24 1148 08/02/24 1434 08/02/24 1622  GLUCAP 310* 275* 302* 336* 328*   Iron  Studies: No results for input(s): IRON , TIBC, TRANSFERRIN, FERRITIN in the last 72 hours. Lab Results  Component Value Date   INR 1.1 06/01/2024   INR 1.1 04/14/2022   INR 1.1 09/17/2020   Studies/Results: No results found.  Medications:  anticoagulant sodium citrate       aspirin  EC  81 mg Oral Daily   atorvastatin   80 mg Oral QHS   carvedilol   6.25 mg Oral BID   Chlorhexidine  Gluconate Cloth  6 each Topical Q0600   cyclobenzaprine   5 mg Oral TID   diclofenac  Sodium  4 g Topical QID   DULoxetine   30 mg Oral Daily   heparin   5,000 Units Subcutaneous Q8H   insulin  aspart  0-5 Units Subcutaneous QHS  insulin  aspart  0-9 Units Subcutaneous TID WC   insulin  aspart  2 Units Subcutaneous TID WC   insulin  glargine  10 Units Subcutaneous Once   [START ON 08/03/2024] insulin  glargine  15 Units Subcutaneous Q24H   lidocaine   2 patch Transdermal Daily   oxyCODONE   10 mg Oral TID   pantoprazole   40 mg Oral Daily   predniSONE   60 mg Oral QAC breakfast   pregabalin   75 mg Oral BID   sodium chloride  flush  3 mL Intravenous Q12H    Dialysis Orders: Center: [facility]  on TTS . 180NRe Time: 4 hr 30 min BFR 450 DFR A 1.5 EDW 102.5kg 2K 2.5Ca AVF 15g Heparin  3000 unit bolus  Mircera 225mcg IV q 2 weeks Hectorol  8mcg IV q HD Sensipar  30mg  PO q HD  Assessment/Plan: AMS: Reportedly began abruptly upon arrival to HD. Work up ongoing per primary team. Does not appear to be uremic based on labs. Does report taking lyrica  and a muscle relaxer pre HD, improvement post dialysis suggests possible polypharmacy   ESRD:  On TTS schedule. Next HD 1/22. Hypertension/volume: Mild volume overload, continue to titrate down volume.. Anemia: Hgb 12.1. No ESA indicated Metabolic bone disease: Calcium  slightly elevated, no VDRA for now. Follow phos Severe b/l knee pain: Followed by Ortho in outpatient Nutrition:  Renal diet with fluid restrictions Diarrhea: reports ongoing for 1 month, per primary team Dispo: Awaiting SNF placement    Charmaine Piety, NP [facility] 08/02/2024,5:50 PM  LOS: 0 days    "

## 2024-08-02 NOTE — Plan of Care (Signed)

## 2024-08-02 NOTE — Progress Notes (Signed)
 " Progress Note   Patient: [name] FMW:[id number] DOB: 1956/04/28 DOA: 07/29/2024     0 DOS: the patient was seen and examined on 08/02/2024   Brief hospital course: Patient is a 69 year old female with HTN, HLP, diabetes, neuropathy, CAD, QT prolongation, PAD, GERD, chronic diastolic CHF, ESRD on hemodialysis, depression, asthma, chronic pain, obesity, OSA, COPD presented with altered mental status.  Patient was reportedly at her baseline on the morning of admission including when her daughter got her in the car to take her to the dialysis.  However on arrival at the dialysis center, patient slumped and was less responsive.  EMS was called.  CBG 199 and continued to be slow to respond.  No focal neurological deficits.  Patient was brought to ED. Chest x-Rewerts showed increased interstitial markings which could represent edema versus atypical infection. CT head showed no acute abnormality.      Assessment & Plan    Acute toxic encephalopathy likely due to polypharmacy - On scheduled oxycodone , lyrica , tizanidine , Atarax , sertraline . As her symptoms improved postdialysis suggest possible polypharmacy. -TSH 0.8, lactic acid mildly elevated 2.4, improved to 1.1.  Procalcitonin 0.72, troponin 216 - Unfortunately, as pain medication regimen was adjusted, patient started having intractable pain and unable to stand up, hence discharge canceled on 1/18. - Currently pain controlled with current regimen, oxycodone  scheduled, decrease to 10mg  TID, Lyrica  75 mg twice daily, continue lidocaine  patch, Flexeril , Cymbalta  30 mg daily, Voltaren  gel -Continue to monitor closely for any acute encephalopathy, respiratory depression or excessive sedation.  Patient needs to follow-up with the pain specialist for pain management outpatient.     ESRD on hemodialysis, TTS Chronic diastolic CHF -Received hemodialysis with improvement in her mental status, suggesting polypharmacy -Continue volume management with HD    Severe bilateral knee pain - Due to severe osteoarthritis, follows orthopedics outpatient, Dr. Kay - Requested orthopedic evaluation, patient has end-stage severe osteoarthritis, cortisone injections is not effective, recommended short-term prednisone  60 mg daily for 5 days - Patient was recommended to follow-up with Dr. Kay as her A1c is now acceptable at this point and needs TKA.     Elevated troponin possibly demand ischemia CAD No complaints of chest pain, acutely.  There was a concern for possible syncopal event hence cardiology was consulted. Seen by cardiology, Dr Ardelle, recommended 2D echo for further evaluation - Continue ASA, atorvastatin  - 2D echo showed EF 65-70%, moderate AS, has outpatient follow-up scheduled. Cleared by cardiology to discharge home. Event monitor will be mailed to the patient.    Diarrhea - Resolved  -patient reported diarrhea for 1 month however improved while inpatient.  C. difficile was ordered.   Hypertension - Continue Coreg  6.25 mg twice daily   Hyperlipidemia - Continue home statin   Diabetes mellitus, type II, uncontrolled with hyperglycemia   CBG (last 3)   CBGs elevated likely due to prednisone  -Continue sliding scale insulin , added NovoLog  2 units 3 times daily AC.  Hemoglobin A1c 6.0.   Neuropathy - Continue Lyrica  75 mg twice daily   PAD - Continue home ASA, atorvastatin    GERD - Continue PPI     OSA - Continue CPAP   Chronic hepatitis C - Noted - LFTs stable   Anemia - Stable at 11.1 - Trend CBC   Obesity class I Estimated body mass index is 39.84 kg/m as calculated from the following:   Height as of this encounter: 5' 5 (1.651 m).   Weight as of this encounter: 108.6 kg.  Code Status: Full code DVT Prophylaxis:  heparin  injection 5,000 Units Start: 07/29/24 2200     Level of Care: Level of care: Telemetry Family Communication: Updated patient Disposition Plan:      Remains inpatient appropriate:  Awaiting SNF   Subjective: Patient having some intermittent altered mentation Denied nausea vomiting abdominal pain  Physical examination General: Alert and oriented x 2, NAD Cardiovascular: S1 S2 clear, RRR.  Respiratory: CTAB, no wheezing, rales or rhonchi Gastrointestinal: Soft, nontender, nondistended, NBS Ext: no pedal edema bilaterally Neuro: no new deficits Psych: Normal affect     Vitals:   08/01/24 1350 08/01/24 1643 08/01/24 2015 08/02/24 0851  BP: 99/61 (!) 139/49 (!) 134/56 (!) 136/53  Pulse: 89 81 85 77  Resp: 18 18 18 18   Temp: 98.3 F (36.8 C) 98.5 F (36.9 C) 99.3 F (37.4 C) 98 F (36.7 C)  TempSrc:   Oral   SpO2: 97% 97% 96% 98%  Weight:      Height:          Latest Ref Rng & Units 08/02/2024    3:42 AM 07/30/2024    4:06 AM 07/29/2024    2:00 PM  CBC  WBC 4.0 - 10.5 K/uL 8.5  6.5  6.9   Hemoglobin 12.0 - 15.0 g/dL 87.1  87.8  88.8   Hematocrit 36.0 - 46.0 % 40.5  38.9  37.7   Platelets 150 - 400 K/uL 152  142  160        Latest Ref Rng & Units 08/02/2024    3:42 AM 07/30/2024    4:06 AM 07/29/2024    1:20 PM  BMP  Glucose 70 - 99 mg/dL 758  866    BUN 8 - 23 mg/dL 44  22    Creatinine 9.55 - 1.00 mg/dL 4.12  5.61    Sodium 864 - 145 mmol/L 132  135  135   Potassium 3.5 - 5.1 mmol/L 5.1  4.2  5.1   Chloride 98 - 111 mmol/L 89  93    CO2 22 - 32 mmol/L 27  26    Calcium  8.9 - 10.3 mg/dL 9.8  89.9       Author: Drue ONEIDA Potter, MD 08/02/2024 3:29 PM  For on call review [email or web address].  "

## 2024-08-02 NOTE — Progress Notes (Signed)
 Physical Therapy Treatment Patient Details Name: [name] MRN: [id number] DOB: 02-11-1956 Today's Date: 08/02/2024   History of Present Illness The pt is a 69 yo female presenting 1/17 after pt with AMS and fall out of her car (landing on knees) at HD. Pt hypotensive and with pinpoint pupils with EMS. Admitted for management of acute encephalopathy due to polypharmacy. PMH includes: arthritis (bilateral knees), ESRD on HD, CAD, CHF, DM II, HTN, MI, obesity (BMI 37).    PT Comments  Continuing work on functional mobility and activity tolerance;  Very nice progress with transferring sit <>stand and with initial gait efforts;  Tends to keep knees near full extension with sit to stand, likely due to pain with knee flexion; compensates to decr knee flexion during transition by taking small steps backwards (arms still pushing from armrests) to get feet under her center of mass; Good use of RW to support painful knees while walking; Excellent participation and motivation; Overall progressing well; Anticipate continuing good progress at post-acute rehabilitation.    If plan is discharge home, recommend the following: Two people to help with walking and/or transfers;Two people to help with bathing/dressing/bathroom;Assistance with cooking/housework;Assist for transportation;Help with stairs or ramp for entrance   Can travel by private vehicle     No  Equipment Recommendations  Wheelchair (measurements PT);Wheelchair cushion (measurements PT);Hoyer lift;Rolling walker (2 wheels)    Recommendations for Other Services       Precautions / Restrictions Precautions Precautions: Fall Recall of Precautions/Restrictions: Intact Restrictions Weight Bearing Restrictions Per Provider Order: No     Mobility  Bed Mobility                    Transfers Overall transfer level: Needs assistance Equipment used: Rolling walker (2 wheels) Transfers: Sit to/from Stand Sit to Stand: Min assist            General transfer comment: Min assist to steady RW; Slow rise, and pt had to step feet back to help get her centerof mass over her feet before she could mover her hands from armrests to teh RW    Ambulation/Gait Ambulation/Gait assistance: Contact guard assist, +2 safety/equipment Gait Distance (Feet): 15 Feet (x2) Assistive device: Rolling walker (2 wheels) Gait Pattern/deviations: Decreased step length - right, Decreased step length - left, Trunk flexed       General Gait Details: Short steps with good use of RW to unweigh painful knees; close guard adn chair follow ready for safety   Stairs             Wheelchair Mobility     Tilt Bed    Modified Rankin (Stroke Patients Only)       Balance     Sitting balance-Leahy Scale: Good       Standing balance-Leahy Scale: Poor Standing balance comment: Dependent on RW                            Communication Communication Communication: No apparent difficulties  Cognition Arousal: Alert Behavior During Therapy: WFL for tasks assessed/performed                             Following commands: Intact      Cueing Cueing Techniques: Verbal cues  Exercises      General Comments General comments (skin integrity, edema, etc.): NAD on RA      Pertinent Vitals/Pain Pain  Assessment Pain Assessment: Faces Faces Pain Scale: Hurts whole lot Pain Location: bilateral knees Pain Descriptors / Indicators: Discomfort, Grimacing Pain Intervention(s): Monitored during session, Patient requesting pain meds-RN notified, RN gave pain meds during session    Home Living                          Prior Function            PT Goals (current goals can now be found in the care plan section) Acute Rehab PT Goals Patient Stated Goal: to reduce pain and return home PT Goal Formulation: With patient Time For Goal Achievement: 08/13/24 Potential to Achieve Goals: Good Progress towards  PT goals: Progressing toward goals    Frequency    Min 2X/week      PT Plan      Co-evaluation              AM-PAC PT 6 Clicks Mobility   Outcome Measure  Help needed turning from your back to your side while in a flat bed without using bedrails?: A Little Help needed moving from lying on your back to sitting on the side of a flat bed without using bedrails?: A Little Help needed moving to and from a bed to a chair (including a wheelchair)?: A Lot Help needed standing up from a chair using your arms (e.g., wheelchair or bedside chair)?: A Little Help needed to walk in hospital room?: A Lot Help needed climbing 3-5 steps with a railing? : Total 6 Click Score: 14    End of Session Equipment Utilized During Treatment: Gait belt Activity Tolerance: Patient tolerated treatment well Patient left: with call bell/phone within reach;in chair;with chair alarm set Nurse Communication: Mobility status PT Visit Diagnosis: Unsteadiness on feet (R26.81);Muscle weakness (generalized) (M62.81);Pain Pain - Right/Left: Left Pain - part of body: Knee (bilateral)     Time: 8955-8891 PT Time Calculation (min) (ACUTE ONLY): 24 min  Charges:    $Gait Training: 8-22 mins $Therapeutic Activity: 8-22 mins PT General Charges $$ ACUTE PT VISIT: 1 Visit                     Silvano [name], PT  Acute Rehabilitation Services Office [phone] Secure Chat welcomed    Silvano VEAR [name] 08/02/2024, 2:37 PM

## 2024-08-02 NOTE — Progress Notes (Signed)
 Occupational Therapy Treatment Patient Details Name: RAPHAEL ESPE MRN: [id number] DOB: 14-May-1956 Today's Date: 08/02/2024   History of present illness The pt is a 69 yo female presenting 1/17 after pt with AMS and fall out of her car (landing on knees) at HD. Pt hypotensive and with pinpoint pupils with EMS. Admitted for management of acute encephalopathy due to polypharmacy. PMH includes: arthritis (bilateral knees), ESRD on HD, CAD, CHF, DM II, HTN, MI, obesity (BMI 37).   OT comments  Pt progressing well towards OT goals. Focus of session on progressing functional mobility and increasing independent engagement in ADL tasks OOB. Pt with improved mobility this date, Min A +2 for functional transfer to recliner with RW. Pt continues to require increased assistance for LB ADLs. OT to continue to follow Pt acutely, continue per POC.       If plan is discharge home, recommend the following:  Two people to help with walking and/or transfers;A lot of help with bathing/dressing/bathroom;Assistance with cooking/housework;Assist for transportation;Help with stairs or ramp for entrance   Equipment Recommendations  Other (comment) (defer)    Recommendations for Other Services      Precautions / Restrictions Precautions Precautions: Fall Recall of Precautions/Restrictions: Intact Restrictions Weight Bearing Restrictions Per Provider Order: No       Mobility Bed Mobility Overal bed mobility: Needs Assistance Bed Mobility: Supine to Sit     Supine to sit: Supervision     General bed mobility comments: Supervision and increased time to engage in bed mobility to come to EOB on R side. No physical assistance required.    Transfers Overall transfer level: Needs assistance Equipment used: Rolling walker (2 wheels) Transfers: Sit to/from Stand, Bed to chair/wheelchair/BSC Sit to Stand: Contact guard assist     Step pivot transfers: Min assist, +2 safety/equipment     General transfer  comment: CGA to rise from bed slightly elevated. Pt able to take small and slow steps to recliner on R side. Min A in standing during transfer.     Balance Overall balance assessment: Needs assistance Sitting-balance support: No upper extremity supported, Feet supported Sitting balance-Leahy Scale: Good     Standing balance support: Bilateral upper extremity supported, During functional activity, Reliant on assistive device for balance Standing balance-Leahy Scale: Poor Standing balance comment: Dependent on RW                           ADL either performed or assessed with clinical judgement   ADL Overall ADL's : Needs assistance/impaired     Grooming: Set up;Sitting               Lower Body Dressing: Maximal assistance                      Extremity/Trunk Assessment Upper Extremity Assessment Upper Extremity Assessment: Generalized weakness            Vision       Perception     Praxis     Communication Communication Communication: No apparent difficulties   Cognition Arousal: Alert Behavior During Therapy: WFL for tasks assessed/performed Cognition: No apparent impairments                               Following commands: Intact        Cueing   Cueing Techniques: Verbal cues  Exercises  Shoulder Instructions       General Comments VSS on RA    Pertinent Vitals/ Pain       Pain Assessment Pain Assessment: 0-10 Pain Score: 8  Pain Location: bilateral knees Pain Descriptors / Indicators: Discomfort, Grimacing Pain Intervention(s): Limited activity within patient's tolerance, Monitored during session, Premedicated before session, Repositioned  Home Living                                          Prior Functioning/Environment              Frequency  Min 2X/week        Progress Toward Goals  OT Goals(current goals can now be found in the care plan section)  Progress  towards OT goals: Progressing toward goals  Acute Rehab OT Goals Patient Stated Goal: to get home OT Goal Formulation: With patient Time For Goal Achievement: 08/14/24 Potential to Achieve Goals: Good ADL Goals Pt Will Perform Lower Body Bathing: with min assist;with adaptive equipment Pt Will Perform Lower Body Dressing: with min assist;with adaptive equipment Pt Will Transfer to Toilet: with min assist;stand pivot transfer;bedside commode Pt Will Perform Tub/Shower Transfer: Tub transfer;tub bench;with min assist Additional ADL Goal #1: Pt will tolerate 5 minute OOB task to address decreased activity tolerance.  Plan      Co-evaluation                 AM-PAC OT 6 Clicks Daily Activity     Outcome Measure   Help from another person eating meals?: None Help from another person taking care of personal grooming?: A Little Help from another person toileting, which includes using toliet, bedpan, or urinal?: A Lot Help from another person bathing (including washing, rinsing, drying)?: A Lot Help from another person to put on and taking off regular upper body clothing?: A Little Help from another person to put on and taking off regular lower body clothing?: A Lot 6 Click Score: 16    End of Session Equipment Utilized During Treatment: Gait belt;Rolling walker (2 wheels)  OT Visit Diagnosis: Unsteadiness on feet (R26.81);Muscle weakness (generalized) (M62.81);History of falling (Z91.81);Pain   Activity Tolerance Patient tolerated treatment well   Patient Left in chair;with call bell/phone within reach;Other (comment) (No box in room, primary nurse made aware and agreeable to Pt sitting in recliner without alarm connected to box)   Nurse Communication Mobility status        Time: 9077-9054 OT Time Calculation (min): 23 min  Charges: OT General Charges $OT Visit: 1 Visit OT Treatments $Therapeutic Activity: 23-37 mins  Maurilio CROME, OTR/L.  [facility] Acute Rehabilitation   Office: [phone]   Maurilio PARAS Tiahna Cure 08/02/2024, 11:36 AM

## 2024-08-02 NOTE — TOC Progression Note (Addendum)
 Transition of Care [facility]) - Progression Note    Patient Details  Name: [name] MRN: [id number] Date of Birth: 1956/06/27  Transition of Care [facility] CM/SW Contact  Lendia Dais, [place] Phone Number: 08/02/2024, 1:32 PM  Clinical Narrative: CSW spoke to [facility] [place] who stated that they may be able to offer the pt a bed depending on if the pt needs IV abx. Post discharge.   CSW spoke to MD via secure chat to inquire about IV abx needs. Pt requires no IV abx now and won't post discharge. CSW informed [facility] via phone and pending a decision.  1614 - CSW followed up with [facility] who stated they are inquiring with the DON for a bed offer. CSW also reached out to [facility] [place].  CSW will continue to monitor.                        Expected Discharge Plan and Services         Expected Discharge Date: 07/30/24                                     Social Drivers of Health (SDOH) Interventions SDOH Screenings   Food Insecurity: No Food Insecurity (07/29/2024)  Housing: Low Risk (07/29/2024)  Transportation Needs: No Transportation Needs (07/29/2024)  Utilities: Not At Risk (07/29/2024)  Financial Resource Strain: Low Risk (12/01/2023)   Received from [facility]  Physical Activity: Inactive (12/01/2023)   Received from [facility]  Social Connections: Moderately Integrated (07/29/2024)  Stress: No Stress Concern Present (12/01/2023)   Received from [facility]  Tobacco Use: Medium Risk (07/30/2024)  Health Literacy: Adequate Health Literacy (12/01/2023)   Received from [facility]    Readmission Risk Interventions    03/07/2024    4:29 PM 11/01/2022    9:36 AM  Readmission Risk Prevention Plan  Transportation Screening Complete Complete  PCP or Specialist Appt within 3-5 Days  Complete  HRI or Home Care Consult Complete Complete  Social Work Consult for Recovery Care  Planning/Counseling Complete Complete  Palliative Care Screening Not Applicable Not Applicable  Medication Review [employer]) Complete Complete

## 2024-08-03 DIAGNOSIS — G9341 Metabolic encephalopathy: Secondary | ICD-10-CM | POA: Diagnosis not present

## 2024-08-03 LAB — GLUCOSE, CAPILLARY
Glucose-Capillary: 252 mg/dL — ABNORMAL HIGH (ref 70–99)
Glucose-Capillary: 184 mg/dL — ABNORMAL HIGH (ref 70–99)
Glucose-Capillary: 319 mg/dL — ABNORMAL HIGH (ref 70–99)
Glucose-Capillary: 507 mg/dL (ref 70–99)

## 2024-08-03 LAB — CULTURE, BLOOD (ROUTINE X 2)
Culture: NO GROWTH
Culture: NO GROWTH
Special Requests: ADEQUATE
Special Requests: ADEQUATE

## 2024-08-03 LAB — CBC
HCT: 39.1 % (ref 36.0–46.0)
Hemoglobin: 11.9 g/dL — ABNORMAL LOW (ref 12.0–15.0)
MCH: 27.2 pg (ref 26.0–34.0)
MCHC: 30.4 g/dL (ref 30.0–36.0)
MCV: 89.3 fL (ref 80.0–100.0)
Platelets: 179 K/uL (ref 150–400)
RBC: 4.38 MIL/uL (ref 3.87–5.11)
RDW: 16.9 % — ABNORMAL HIGH (ref 11.5–15.5)
WBC: 8.2 K/uL (ref 4.0–10.5)
nRBC: 0.4 % — ABNORMAL HIGH (ref 0.0–0.2)

## 2024-08-03 LAB — BASIC METABOLIC PANEL WITH GFR
Anion gap: 20 — ABNORMAL HIGH (ref 5–15)
BUN: 69 mg/dL — ABNORMAL HIGH (ref 8–23)
CO2: 25 mmol/L (ref 22–32)
Calcium: 9.4 mg/dL (ref 8.9–10.3)
Chloride: 90 mmol/L — ABNORMAL LOW (ref 98–111)
Creatinine, Ser: 7.59 mg/dL — ABNORMAL HIGH (ref 0.44–1.00)
GFR, Estimated: 5 mL/min — ABNORMAL LOW
Glucose, Bld: 219 mg/dL — ABNORMAL HIGH (ref 70–99)
Potassium: 5.3 mmol/L — ABNORMAL HIGH (ref 3.5–5.1)
Sodium: 134 mmol/L — ABNORMAL LOW (ref 135–145)

## 2024-08-03 LAB — PHOSPHORUS: Phosphorus: 6 mg/dL — ABNORMAL HIGH (ref 2.5–4.6)

## 2024-08-03 MED ORDER — OXYCODONE HCL 5 MG PO TABS
ORAL_TABLET | ORAL | Status: AC
  Filled 2024-08-03: qty 2

## 2024-08-03 MED ORDER — INSULIN GLARGINE 100 UNIT/ML ~~LOC~~ SOLN
20.0000 [IU] | SUBCUTANEOUS | Status: DC
  Administered 2024-08-03: 20 [IU] via SUBCUTANEOUS
  Filled 2024-08-03 (×2): qty 0.2

## 2024-08-03 NOTE — Inpatient Diabetes Management (Addendum)
 Inpatient Diabetes Program Recommendations  AACE/ADA: New Consensus Statement on Inpatient Glycemic Control (2015)  Target Ranges:  Prepandial:   less than 140 mg/dL      Peak postprandial:   less than 180 mg/dL (1-2 hours)      Critically ill patients:  140 - 180 mg/dL   Lab Results  Component Value Date   GLUCAP 252 (H) 08/03/2024   HGBA1C 6.0 (H) 08/01/2024    Review of Glycemic Control  Latest Reference Range & Units 08/02/24 14:34 08/02/24 16:22 08/02/24 20:36 08/03/24 08:09  Glucose-Capillary 70 - 99 mg/dL 663 (H) 671 (H) 647 (H) 252 (H)   Diabetes history: DM 2 Outpatient Diabetes medications:  Ozempic 2 mg weekly on Sundays  Current orders for Inpatient glycemic control:  Novolog  0-9 units tid with meals and HS Novolog  2 units tid with meals  Lantus  20 units daily Prednisone  60 mg daily (for 5 days- Last dose 08/05/24)  Inpatient Diabetes Program Recommendations:   Referral received.  Note elevated CBG's in the hospital.  Patient is on steroids at this time so difficult to know what insulin  needs will be once steroids stopped.  A1C indicates good control of CBG's prior to admit (6.0%).  May not need insulin  at discharge?? Needs to restart Ozempic once she discharges home.  Will follow.   Thanks,  Randall Bullocks, RN, BC-ADM Inpatient Diabetes Coordinator Pager [phone]  (8a-5p)

## 2024-08-03 NOTE — Plan of Care (Signed)

## 2024-08-03 NOTE — Progress Notes (Signed)
 " Progress Note   Patient: [name] FMW:[id number] DOB: 02-Nov-1955 DOA: 07/29/2024     0 DOS: the patient was seen and examined on 08/03/2024    Brief hospital course: Patient is a 69 year old female with HTN, HLP, diabetes, neuropathy, CAD, QT prolongation, PAD, GERD, chronic diastolic CHF, ESRD on hemodialysis, depression, asthma, chronic pain, obesity, OSA, COPD presented with altered mental status.  Patient was reportedly at her baseline on the morning of admission including when her daughter got her in the car to take her to the dialysis.  However on arrival at the dialysis center, patient slumped and was less responsive.  EMS was called.  CBG 199 and continued to be slow to respond.  No focal neurological deficits.  Patient was brought to ED. Chest x-[place] showed increased interstitial markings which could represent edema versus atypical infection. CT head showed no acute abnormality.      Assessment & Plan    Acute toxic encephalopathy likely due to polypharmacy - On scheduled oxycodone , lyrica , tizanidine , Atarax , sertraline . As her symptoms improved postdialysis suggest possible polypharmacy. -TSH 0.8, lactic acid mildly elevated 2.4, improved to 1.1.  Procalcitonin 0.72, troponin 216 - Unfortunately, as pain medication regimen was adjusted, patient started having intractable pain and unable to stand up, hence discharge canceled on 1/18. - Currently pain controlled with current regimen, oxycodone  scheduled, decrease to 10mg  TID, Lyrica  75 mg twice daily, continue lidocaine  patch, Flexeril , Cymbalta  30 mg daily, Voltaren  gel -Continue to monitor closely for any acute encephalopathy, respiratory depression or excessive sedation.  Patient needs to follow-up with the pain specialist for pain management outpatient. According to [facility] authorization started and patient can potentially go to facility by tomorrow     ESRD on hemodialysis, TTS Chronic diastolic CHF -Received hemodialysis  with improvement in her mental status, suggesting polypharmacy -Continue volume management with HD   Severe bilateral knee pain - Due to severe osteoarthritis, follows orthopedics outpatient, Dr. Kay - Requested orthopedic evaluation, patient has end-stage severe osteoarthritis, cortisone injections is not effective, recommended short-term prednisone  60 mg daily for 5 days - Patient was recommended to follow-up with Dr. Kay as her A1c is now acceptable at this point and needs TKA.     Elevated troponin possibly demand ischemia CAD No complaints of chest pain, acutely.  There was a concern for possible syncopal event hence cardiology was consulted. Seen by cardiology, Dr Ardelle, recommended 2D echo for further evaluation - Continue ASA, atorvastatin  - 2D echo showed EF 65-70%, moderate AS, has outpatient follow-up scheduled. Cleared by cardiology to discharge home. Event monitor will be mailed to the patient.    Diarrhea - Resolved  -patient reported diarrhea for 1 month however improved while inpatient.  C. difficile was ordered.   Hypertension - Continue Coreg  6.25 mg twice daily   Hyperlipidemia - Continue home statin   Diabetes mellitus, type II, uncontrolled with hyperglycemia   CBG (last 3)  CBGs elevated likely due to prednisone  -Continue sliding scale insulin , added NovoLog  2 units 3 times daily AC.  Hemoglobin A1c 6.0.   Neuropathy - Continue Lyrica  75 mg twice daily   PAD - Continue home ASA, atorvastatin    GERD - Continue PPI     OSA - Continue CPAP   Chronic hepatitis C - Noted - LFTs stable   Anemia - Stable at 11.1 - Trend CBC   Obesity class I Estimated body mass index is 39.84 kg/m as calculated from the following:   Height as of  this encounter: 5' 5 (1.651 m).   Weight as of this encounter: 108.6 kg.   Code Status: Full code DVT Prophylaxis:  heparin  injection 5,000 Units Start: 07/29/24 2200     Level of Care: Level of care:  Telemetry Family Communication: Updated patient Disposition Plan:      Remains inpatient appropriate: Awaiting SNF   Subjective: Patient having some intermittent altered mentation Denied nausea vomiting abdominal pain   Physical examination General: Alert and oriented x 2, NAD Cardiovascular: S1 S2 clear, RRR.  Respiratory: CTAB, no wheezing, rales or rhonchi Gastrointestinal: Soft, nontender, nondistended, NBS Ext: no pedal edema bilaterally Neuro: no new deficits Psych: Normal affect   Data Reviewed:    Latest Ref Rng & Units 08/03/2024    3:43 AM 08/02/2024    3:42 AM 07/30/2024    4:06 AM  CBC  WBC 4.0 - 10.5 K/uL 8.2  8.5  6.5   Hemoglobin 12.0 - 15.0 g/dL 88.0  87.1  87.8   Hematocrit 36.0 - 46.0 % 39.1  40.5  38.9   Platelets 150 - 400 K/uL 179  152  142        Latest Ref Rng & Units 08/03/2024    3:43 AM 08/02/2024    3:42 AM 07/30/2024    4:06 AM  BMP  Glucose 70 - 99 mg/dL 780  758  866   BUN 8 - 23 mg/dL 69  44  22   Creatinine 0.44 - 1.00 mg/dL 2.40  4.12  5.61   Sodium 135 - 145 mmol/L 134  132  135   Potassium 3.5 - 5.1 mmol/L 5.3  5.1  4.2   Chloride 98 - 111 mmol/L 90  89  93   CO2 22 - 32 mmol/L 25  27  26    Calcium  8.9 - 10.3 mg/dL 9.4  9.8  89.9      Vitals:   08/03/24 1200 08/03/24 1230 08/03/24 1300 08/03/24 1302  BP: (!) 108/52 (!) 91/56 (!) 123/106 114/70  Pulse: 73 75 75 74  Resp: 14 12 11 10   Temp:    98.5 F (36.9 C)  TempSrc:      SpO2: 95% 95% 96% 95%  Weight:    107.6 kg  Height:         Author: Drue ONEIDA Potter, MD 08/03/2024 4:17 PM  For on call review [email or web address].  "

## 2024-08-03 NOTE — Progress Notes (Signed)
 " [facility] Progress Note   Subjective:    Seen and examined patient on HD. Tolerating UFG 2L and BP is 106/48. She denies any acute issues. Awaiting SNF placement.  Objective Vitals:   08/03/24 0900 08/03/24 0930 08/03/24 1000 08/03/24 1030  BP: (!) 90/58 (!) 112/53 (!) 127/57 (!) 102/48  Pulse: 81 76 76 72  Resp: 13 11 12 12   Temp:      TempSrc:      SpO2: 95% 94% 98% 95%  Weight:      Height:       Physical Exam General: Alert female in NAD Heart: RRR, no murmur Lungs: CTA bilaterally, respirations unlabored Abdomen: Soft and non-distended  Extremities: No edema b/l lower extremities Dialysis Access:  AVF + t/b  Filed Weights   08/01/24 0811 08/01/24 1330 08/03/24 0847  Weight: 108.6 kg 107.1 kg 108.9 kg    Intake/Output Summary (Last 24 hours) at 08/03/2024 1056 Last data filed at 08/03/2024 0700 Gross per 24 hour  Intake 480 ml  Output 0 ml  Net 480 ml    Additional Objective Labs: Basic Metabolic Panel: Recent Labs  Lab 07/30/24 0406 08/02/24 0342 08/03/24 0343  NA 135 132* 134*  K 4.2 5.1 5.3*  CL 93* 89* 90*  CO2 26 27 25   GLUCOSE 133* 241* 219*  BUN 22 44* 69*  CREATININE 4.38* 5.87* 7.59*  CALCIUM  10.0 9.8 9.4  PHOS  --   --  6.0*   Liver Function Tests: Recent Labs  Lab 07/29/24 1250 07/30/24 0406  AST 26 23  ALT 27 22  ALKPHOS 270* 273*  BILITOT 0.3 0.3  PROT 6.9 7.0  ALBUMIN  4.1 4.1   No results for input(s): LIPASE, AMYLASE in the last 168 hours. CBC: Recent Labs  Lab 07/29/24 1400 07/30/24 0406 08/02/24 0342 08/03/24 0343  WBC 6.9 6.5 8.5 8.2  NEUTROABS 4.6  --   --   --   HGB 11.1* 12.1 12.8 11.9*  HCT 37.7 38.9 40.5 39.1  MCV 91.7 89.4 88.0 89.3  PLT 160 142* 152 179   Blood Culture    Component Value Date/Time   SDES BLOOD RIGHT ARM 07/29/2024 1400   SPECREQUEST  07/29/2024 1400    BOTTLES DRAWN AEROBIC AND ANAEROBIC Blood Culture adequate volume   CULT  07/29/2024 1400    NO GROWTH 5  DAYS Performed at [facility], [address] [place], [place] [address]    REPTSTATUS 08/03/2024 FINAL 07/29/2024 1400    Cardiac Enzymes: No results for input(s): CKTOTAL, CKMB, CKMBINDEX, TROPONINI in the last 168 hours. CBG: Recent Labs  Lab 08/02/24 1148 08/02/24 1434 08/02/24 1622 08/02/24 2036 08/03/24 0809  GLUCAP 302* 336* 328* 352* 252*   Iron  Studies: No results for input(s): IRON , TIBC, TRANSFERRIN, FERRITIN in the last 72 hours. Lab Results  Component Value Date   INR 1.1 06/01/2024   INR 1.1 04/14/2022   INR 1.1 09/17/2020   Studies/Results: No results found.  Medications:  anticoagulant sodium citrate       aspirin  EC  81 mg Oral Daily   atorvastatin   80 mg Oral QHS   carvedilol   6.25 mg Oral BID   Chlorhexidine  Gluconate Cloth  6 each Topical Q0600   cyclobenzaprine   5 mg Oral TID   diclofenac  Sodium  4 g Topical QID   DULoxetine   30 mg Oral Daily   heparin   5,000 Units Subcutaneous Q8H   insulin  aspart  0-5 Units Subcutaneous QHS  insulin  aspart  0-9 Units Subcutaneous TID WC   insulin  aspart  2 Units Subcutaneous TID WC   insulin  glargine  20 Units Subcutaneous Q24H   lidocaine   2 patch Transdermal Daily   oxyCODONE   10 mg Oral TID   pantoprazole   40 mg Oral Daily   predniSONE   60 mg Oral QAC breakfast   sodium chloride  flush  3 mL Intravenous Q12H    Dialysis Orders: Center: [facility]  on TTS . 180NRe Time: 4 hr 30 min BFR 450 DFR A 1.5 EDW 102.5kg 2K 2.5Ca AVF 15g Heparin  3000 unit bolus  Mircera 225mcg IV q 2 weeks Hectorol  8mcg IV q HD Sensipar  30mg  PO q HD  Assessment/Plan: AMS: Reportedly began abruptly upon arrival to HD. Work up ongoing per primary team. Does not appear to be uremic based on labs. Does report taking lyrica  and a muscle relaxer pre HD, improvement post dialysis suggests possible polypharmacy  ESRD:  On TTS schedule. On HD. Hypertension/volume: Mild volume overload, continue to titrate down  volume.. Anemia: Hgb 11.9. No ESA indicated Metabolic bone disease: Calcium  slightly elevated, no VDRA for now. Follow phos Severe b/l knee pain: Followed by Ortho in outpatient Nutrition:  Renal diet with fluid restrictions Diarrhea: reports ongoing for 1 month, per primary team Dispo: Awaiting SNF placement  Charmaine Piety, NP [facility] 08/03/2024,10:56 AM  LOS: 0 days    "

## 2024-08-03 NOTE — Progress Notes (Signed)
 PT Cancellation Note  Patient Details Name: [name] MRN: [id number] DOB: [birth year]   Cancelled Treatment:    Reason Eval/Treat Not Completed: Patient at procedure or test/unavailable (Pt in HD.  Will check back later date.)   Stephane JULIANNA Bevel 08/03/2024, 8:57 AM [facility] M,PT Acute Rehab Services [phone]

## 2024-08-03 NOTE — TOC Progression Note (Addendum)
 Transition of Care [facility]) - Progression Note    Patient Details  Name: [name] MRN: [id number] Date of Birth: February 20, 1956  [facility]) CM/SW Contact  Lendia Dais, LCSWA Phone Number: 08/03/2024, 4:51 PM  Clinical Narrative: CSW spoke to the pt at bedside who was agreeable to discharging to [facility].   Shara is pending for [name] Shara ID is [id number]. Per [facility], they can receive the pt tomorrow if shara is approved. MD notified.  CSW will continue to monitor.                       Expected Discharge Plan and Services         Expected Discharge Date: 07/30/24                                     Social Drivers of Health (SDOH) Interventions SDOH Screenings   Food Insecurity: No Food Insecurity (07/29/2024)  Housing: Low Risk (07/29/2024)  Transportation Needs: No Transportation Needs (07/29/2024)  Utilities: Not At Risk (07/29/2024)  Financial Resource Strain: Low Risk (12/01/2023)   Received from [facility]  Physical Activity: Inactive (12/01/2023)   Received from [facility]  Social Connections: Moderately Integrated (07/29/2024)  Stress: No Stress Concern Present (12/01/2023)   Received from [facility]  Tobacco Use: Medium Risk (07/30/2024)  Health Literacy: Adequate Health Literacy (12/01/2023)   Received from [facility]    Readmission Risk Interventions    03/07/2024    4:29 PM 11/01/2022    9:36 AM  Readmission Risk Prevention Plan  Transportation Screening Complete Complete  PCP or Specialist Appt within 3-5 Days  Complete  HRI or Home Care Consult Complete Complete  Social Work Consult for Recovery Care Planning/Counseling Complete Complete  Palliative Care Screening Not Applicable Not Applicable  Medication Review Oceanographer) Complete Complete

## 2024-08-04 DIAGNOSIS — G9341 Metabolic encephalopathy: Secondary | ICD-10-CM | POA: Diagnosis not present

## 2024-08-04 LAB — BASIC METABOLIC PANEL WITH GFR
Anion gap: 17 — ABNORMAL HIGH (ref 5–15)
BUN: 53 mg/dL — ABNORMAL HIGH (ref 8–23)
CO2: 22 mmol/L (ref 22–32)
Calcium: 9 mg/dL (ref 8.9–10.3)
Chloride: 90 mmol/L — ABNORMAL LOW (ref 98–111)
Creatinine, Ser: 5.88 mg/dL — ABNORMAL HIGH (ref 0.44–1.00)
GFR, Estimated: 7 mL/min — ABNORMAL LOW
Glucose, Bld: 237 mg/dL — ABNORMAL HIGH (ref 70–99)
Potassium: 5.2 mmol/L — ABNORMAL HIGH (ref 3.5–5.1)
Sodium: 129 mmol/L — ABNORMAL LOW (ref 135–145)

## 2024-08-04 LAB — CBC
HCT: 37.7 % (ref 36.0–46.0)
Hemoglobin: 11.8 g/dL — ABNORMAL LOW (ref 12.0–15.0)
MCH: 27.6 pg (ref 26.0–34.0)
MCHC: 31.3 g/dL (ref 30.0–36.0)
MCV: 88.1 fL (ref 80.0–100.0)
Platelets: 144 K/uL — ABNORMAL LOW (ref 150–400)
RBC: 4.28 MIL/uL (ref 3.87–5.11)
RDW: 17 % — ABNORMAL HIGH (ref 11.5–15.5)
WBC: 6.6 K/uL (ref 4.0–10.5)
nRBC: 0.5 % — ABNORMAL HIGH (ref 0.0–0.2)

## 2024-08-04 LAB — GLUCOSE, CAPILLARY: Glucose-Capillary: 218 mg/dL — ABNORMAL HIGH (ref 70–99)

## 2024-08-04 MED ORDER — SODIUM ZIRCONIUM CYCLOSILICATE 10 G PO PACK
10.0000 g | PACK | Freq: Once | ORAL | Status: AC
  Administered 2024-08-04: 10 g via ORAL
  Filled 2024-08-04: qty 1

## 2024-08-04 MED ORDER — OXYCODONE HCL 5 MG PO TABS: 5.0000 mg | ORAL_TABLET | Freq: Four times a day (QID) | ORAL | 0 refills | Status: AC | PRN

## 2024-08-04 MED ORDER — DULOXETINE HCL 30 MG PO CPEP: 30.0000 mg | ORAL_CAPSULE | Freq: Every day | ORAL | Status: AC

## 2024-08-04 MED ORDER — CARVEDILOL 6.25 MG PO TABS: 6.2500 mg | ORAL_TABLET | Freq: Two times a day (BID) | ORAL | Status: AC

## 2024-08-04 NOTE — TOC Progression Note (Signed)
 Transition of Care [facility]) - Progression Note    Patient Details  Name: [name] MRN: [id number] Date of Birth: [birth year]  Transition of Care [facility]) CM/SW Contact  Lendia Dais, [place] Phone Number: 08/04/2024, 8:31 AM  Clinical Narrative:  Shara for [name] is approved, shara ID is [id number] and valid from 08/04/2024-08/08/2024. MD notified.  CSW will continue to monitor for discharge.                      Expected Discharge Plan and Services         Expected Discharge Date: 07/30/24                                     Social Drivers of Health (SDOH) Interventions SDOH Screenings   Food Insecurity: No Food Insecurity (07/29/2024)  Housing: Low Risk (07/29/2024)  Transportation Needs: No Transportation Needs (07/29/2024)  Utilities: Not At Risk (07/29/2024)  Financial Resource Strain: Low Risk (12/01/2023)   Received from [facility]  Physical Activity: Inactive (12/01/2023)   Received from [facility]  Social Connections: Moderately Integrated (07/29/2024)  Stress: No Stress Concern Present (12/01/2023)   Received from [facility]  Tobacco Use: Medium Risk (07/30/2024)  Health Literacy: Adequate Health Literacy (12/01/2023)   Received from [facility]    Readmission Risk Interventions    03/07/2024    4:29 PM 11/01/2022    9:36 AM  Readmission Risk Prevention Plan  Transportation Screening Complete Complete  PCP or Specialist Appt within 3-5 Days  Complete  HRI or Home Care Consult Complete Complete  Social Work Consult for Recovery Care Planning/Counseling Complete Complete  Palliative Care Screening Not Applicable Not Applicable  Medication Review Oceanographer) Complete Complete

## 2024-08-04 NOTE — Progress Notes (Signed)
 Physical Therapy Treatment Patient Details Name: [name] MRN: [id number] DOB: April 22, 1956 Today's Date: 08/04/2024   History of Present Illness The pt is a 69 yo female presenting 1/17 after pt with AMS and fall out of her car (landing on knees) at HD. Pt hypotensive and with pinpoint pupils with EMS. Admitted for management of acute encephalopathy due to polypharmacy. PMH includes: arthritis (bilateral knees), ESRD on HD, CAD, CHF, DM II, HTN, MI, obesity (BMI 37).    PT Comments  Pt admitted with above diagnosis. Pt was able to ambulate with RW incr distance into hallway. No LOB but did c/o pain end of walk bil knees. Was going to continue some exercises however was interupted by NT who came to get pt dressed to be discharged to post acute rehab.   Pt currently with functional limitations due to the deficits listed below (see PT Problem List). Pt will benefit from acute skilled PT to increase their independence and safety with mobility to allow discharge.       If plan is discharge home, recommend the following: Two people to help with walking and/or transfers;Two people to help with bathing/dressing/bathroom;Assistance with cooking/housework;Assist for transportation;Help with stairs or ramp for entrance   Can travel by private vehicle     No  Equipment Recommendations  Wheelchair (measurements PT);Wheelchair cushion (measurements PT);Hoyer lift;Rolling walker (2 wheels)    Recommendations for Other Services       Precautions / Restrictions Precautions Precautions: Fall Recall of Precautions/Restrictions: Intact Restrictions Weight Bearing Restrictions Per Provider Order: No     Mobility  Bed Mobility               General bed mobility comments: in chair on arrival    Transfers Overall transfer level: Needs assistance Equipment used: Rolling walker (2 wheels) Transfers: Sit to/from Stand Sit to Stand: Contact guard assist           General transfer comment:  Slow rise, and pt had to step feet back to help get her center of mass over her feet before she could move her hands from armrests to  RW    Ambulation/Gait Ambulation/Gait assistance: Contact guard assist, +2 safety/equipment Gait Distance (Feet): 130 Feet Assistive device: Rolling walker (2 wheels) Gait Pattern/deviations: Decreased step length - right, Decreased step length - left, Trunk flexed, Wide base of support   Gait velocity interpretation: <1.31 ft/sec, indicative of household ambulator   General Gait Details: Short steps with good use of RW to unweigh painful knees; No LOB with no challenges to balance. Pt did not need chair follow. Did report increasing bil LE pain the last 30 feet of ambulation   Stairs             Wheelchair Mobility     Tilt Bed    Modified Rankin (Stroke Patients Only)       Balance Overall balance assessment: Needs assistance Sitting-balance support: No upper extremity supported, Feet supported Sitting balance-Leahy Scale: Good     Standing balance support: Bilateral upper extremity supported, During functional activity, Reliant on assistive device for balance Standing balance-Leahy Scale: Poor Standing balance comment: Dependent on RW                            Communication Communication Communication: No apparent difficulties  Cognition Arousal: Alert Behavior During Therapy: WFL for tasks assessed/performed   PT - Cognitive impairments: No apparent impairments  Following commands: Intact      Cueing Cueing Techniques: Verbal cues  Exercises General Exercises - Lower Extremity Long Arc Quad: AROM, Both, 5 reps, Seated Hip Flexion/Marching: AROM, Both, 10 reps, Seated    General Comments General comments (skin integrity, edema, etc.): VSS      Pertinent Vitals/Pain Pain Assessment Pain Assessment: Faces Faces Pain Scale: Hurts little more Pain Location: bilateral  knees Pain Descriptors / Indicators: Discomfort, Grimacing Pain Intervention(s): Limited activity within patient's tolerance, Monitored during session, Repositioned    Home Living                          Prior Function            PT Goals (current goals can now be found in the care plan section) Acute Rehab PT Goals Patient Stated Goal: to reduce pain and return home Progress towards PT goals: Progressing toward goals    Frequency    Min 2X/week      PT Plan      Co-evaluation              AM-PAC PT 6 Clicks Mobility   Outcome Measure  Help needed turning from your back to your side while in a flat bed without using bedrails?: A Little Help needed moving from lying on your back to sitting on the side of a flat bed without using bedrails?: A Little Help needed moving to and from a bed to a chair (including a wheelchair)?: A Lot Help needed standing up from a chair using your arms (e.g., wheelchair or bedside chair)?: A Little Help needed to walk in hospital room?: A Little Help needed climbing 3-5 steps with a railing? : Total 6 Click Score: 15    End of Session Equipment Utilized During Treatment: Gait belt Activity Tolerance: Patient tolerated treatment well Patient left: with call bell/phone within reach;in chair;with chair alarm set Nurse Communication: Mobility status PT Visit Diagnosis: Unsteadiness on feet (R26.81);Muscle weakness (generalized) (M62.81);Pain Pain - Right/Left: Left Pain - part of body: Knee (bilateral)     Time: 8897-8877 PT Time Calculation (min) (ACUTE ONLY): 20 min  Charges:    $Gait Training: 8-22 mins PT General Charges $$ ACUTE PT VISIT: 1 Visit                     [name],PT Acute Rehab Services [phone]    [name] 08/04/2024, 1:04 PM

## 2024-08-04 NOTE — Progress Notes (Signed)
 D/C order noted. Contacted [facility] to be advised of pt's d/c to snf today and pt should resume care tomorrow. HD info placed on AVS and snf name provided to clinic.   Randine Mungo Dialysis Navigator [phone]

## 2024-08-04 NOTE — Progress Notes (Signed)
 [facility] Patient Discharge Orders - [facility] CLINIC: GOC   Patient's name: SHERRIA RIEMANN Admit/DC Dates: 07/29/2024 - 08/04/2024  DISCHARGE DIAGNOSES: AMS - improved with dialysis, felt polypharmacy  Diarrhea Knee pain Debility  HD ORDER CHANGES: Heparin  change: no EDW Change: no  Bath Change: no  ANEMIA MANAGEMENT: Aranesp : Given: no  ESA dose for discharge: Hold until Hgb < 11 IV Iron  dose at discharge: Per protocol Transfusion: Given: no  BONE/MINERAL MEDICATIONS: Hectorol /Calcitriol  change: Yes - reduce hectoral to 6mcg IV q HD - Ca high here Sensipar /Parsabiv change: no  ACCESS INTERVENTION/CHANGE: no Details:   RECENT LABS: Recent Labs  Lab 07/30/24 0406 08/02/24 0342 08/03/24 0343 08/04/24 0601  HGB 12.1   < > 11.9* 11.8*  NA 135   < > 134* 129*  K 4.2   < > 5.3* 5.2*  CALCIUM  10.0   < > 9.4 9.0  PHOS  --   --  6.0*  --   ALBUMIN  4.1  --   --   --    < > = values in this interval not displayed.    IV ANTIBIOTICS: no Details:  OTHER ANTICOAGULATION: no Details:  OTHER/APPTS/LAB ORDERS: - D/c to SNF [facility])  D/C Meds to be reconciled by nurse after every discharge.  Completed By: Izetta Boehringer, PA-C [facility] Pager [phone]   Reviewed by: MD:______ RN_______

## 2024-08-04 NOTE — TOC Progression Note (Signed)
 Transition of Care [facility]) - Progression Note    Patient Details  Name: [name] MRN: [id number] Date of Birth: 06/02/1956  [facility]) CM/SW Contact  Lendia Dais, [place] Phone Number: 08/04/2024, 1:21 PM  Clinical Narrative: Pt is discharging to [facility]. RN report to [phone]. Pt will go by PTAR called at 12:05.  CSW called and informed pt's daughter Jazzlyn.  No further TOC needs.        Barriers to Discharge: Barriers Resolved               Expected Discharge Plan and Services         Expected Discharge Date: 08/04/24                                     Social Drivers of Health (SDOH) Interventions SDOH Screenings   Food Insecurity: No Food Insecurity (07/29/2024)  Housing: Low Risk (07/29/2024)  Transportation Needs: No Transportation Needs (07/29/2024)  Utilities: Not At Risk (07/29/2024)  Financial Resource Strain: Low Risk (12/01/2023)   Received from [facility]  Physical Activity: Inactive (12/01/2023)   Received from [facility]  Social Connections: Moderately Integrated (07/29/2024)  Stress: No Stress Concern Present (12/01/2023)   Received from [facility]  Tobacco Use: Medium Risk (07/30/2024)  Health Literacy: Adequate Health Literacy (12/01/2023)   Received from [facility]    Readmission Risk Interventions    03/07/2024    4:29 PM 11/01/2022    9:36 AM  Readmission Risk Prevention Plan  Transportation Screening Complete Complete  PCP or Specialist Appt within 3-5 Days  Complete  HRI or Home Care Consult Complete Complete  Social Work Consult for Recovery Care Planning/Counseling Complete Complete  Palliative Care Screening Not Applicable Not Applicable  Medication Review Oceanographer) Complete Complete

## 2024-08-04 NOTE — Inpatient Diabetes Management (Signed)
 Inpatient Diabetes Program Recommendations  AACE/ADA: New Consensus Statement on Inpatient Glycemic Control (2015)  Target Ranges:  Prepandial:   less than 140 mg/dL      Peak postprandial:   less than 180 mg/dL (1-2 hours)      Critically ill patients:  140 - 180 mg/dL   Lab Results  Component Value Date   GLUCAP 218 (H) 08/04/2024   HGBA1C 6.0 (H) 08/01/2024    Review of Glycemic Control  Latest Reference Range & Units 08/03/24 14:21 08/03/24 17:12 08/03/24 19:54 08/04/24 07:56  Glucose-Capillary 70 - 99 mg/dL 815 (H) 680 (H) 492 (HH) 218 (H)   Diabetes history: DM 2 Outpatient Diabetes medications:  Ozempic 2 mg weekly on Sundays  Current orders for Inpatient glycemic control:  Novolog  0-9 units tid with meals and HS Novolog  2 units tid with meals  Lantus  20 units daily Prednisone  60 mg daily (for 5 days- Last dose 08/05/24)  Inpatient Diabetes Program Recommendations:    Consider increasing Novolog  meal coverage to 5 units tid with meals (hold if patient eats less than 50% or NPO) while on Prednisone .   Thanks,  Randall Bullocks, RN, BC-ADM Inpatient Diabetes Coordinator Pager [phone]  (8a-5p)

## 2024-08-04 NOTE — Discharge Summary (Signed)
 " Physician Discharge Summary   Patient: [name] MRN: [id number] DOB: [birth year]  Admit date:     07/29/2024  Discharge date: 08/04/24  Discharge Physician: Drue ONEIDA Potter   PCP: [facility]   Recommendations at discharge:  Follow-up with nephrologist as well as cardiology  Discharge Diagnoses: Active Problems:   COPD (chronic obstructive pulmonary disease) (HCC)   ESRD on hemodialysis (HCC)   Essential hypertension   Type 2 diabetes mellitus with chronic kidney disease, with long-term current use of insulin  (HCC)   OSA (obstructive sleep apnea)   Chronic hepatitis C without hepatic coma (HCC)   Diabetic neuropathy (HCC)   GERD (gastroesophageal reflux disease)   Depression   Asthma   Hyperlipidemia   Chronic gout due to renal impairment of multiple sites without tophus   Chronic diastolic heart failure (HCC)   ESRD (end stage renal disease) on dialysis (HCC)   Iron  deficiency anemia, unspecified   PVD (peripheral vascular disease)   BMI 45.0-49.9, adult (HCC)   Prolonged QT interval   Acute encephalopathy  Resolved Problems:   * No resolved hospital problems. *  Hospital Course: Patient is a 69 year old female with HTN, HLP, diabetes, neuropathy, CAD, QT prolongation, PAD, GERD, chronic diastolic CHF, ESRD on hemodialysis, depression, asthma, chronic pain, obesity, OSA, COPD presented with altered mental status.  Patient was reportedly at her baseline on the morning of admission including when her daughter got her in the car to take her to the dialysis.  However on arrival at the dialysis center, patient slumped and was less responsive.  EMS was called.  CBG 199 and continued to be slow to respond.  No focal neurological deficits.  Patient was brought to ED. Chest x-Umali showed increased interstitial markings which could represent edema versus atypical infection. CT head showed no acute abnormality.    Hospital course as noted below:  Assessment & Plan    Acute  toxic encephalopathy likely due to polypharmacy - On scheduled oxycodone , lyrica , tizanidine , Atarax , sertraline . As her symptoms improved postdialysis suggest possible polypharmacy. -TSH 0.8, lactic acid mildly elevated 2.4, improved to 1.1.  Procalcitonin 0.72, troponin 216 - Unfortunately, as pain medication regimen was adjusted, patient started having intractable pain and unable to stand up, hence discharge canceled on 1/18. - Currently pain controlled with current regimen, oxycodone , Lyrica  75 mg twice daily, Flexeril , Cymbalta  30 mg daily, Voltaren  gel Acute encephalopathy resolved  Patient worked with PT OT with recommendation for SNF placement   ESRD on hemodialysis, TTS Chronic diastolic CHF -Received hemodialysis with improvement in her mental status, suggesting polypharmacy -Continue volume management with HD   Severe bilateral knee pain - Due to severe osteoarthritis, follows orthopedics outpatient, Dr. Kay - Requested orthopedic evaluation, patient has end-stage severe osteoarthritis, cortisone injections is not effective, recommended short-term prednisone  60 mg daily for 5 days Patient completed steroid course with improvement - Patient was recommended to follow-up with Dr. Kay as her A1c is now acceptable at this point and needs TKA.     Elevated troponin possibly demand ischemia CAD No complaints of chest pain, acutely.  There was a concern for possible syncopal event hence cardiology was consulted. Seen by cardiology, Dr Ardelle, recommended 2D echo for further evaluation - Continue ASA, atorvastatin  - 2D echo showed EF 65-70%, moderate AS, has outpatient follow-up scheduled. Cleared by cardiology to discharge home.    Diarrhea - Resolved    Hypertension - Continue Coreg  6.25 mg twice daily   Hyperlipidemia - Continue  home statin   Diabetes mellitus, type II, uncontrolled with hyperglycemia Hemoglobin A1c 6.0.   Neuropathy - Continue Lyrica  75 mg twice  daily   PAD - Continue home ASA, atorvastatin    GERD - Continue PPI     OSA - Continue CPAP   Chronic hepatitis C - Noted - LFTs stable   Anemia - Stable at 11.1 - Trend CBC   Obesity class I Counseled on weight loss   Consultants: As mentioned above Procedures performed: Hemodialysis Disposition: Skilled nursing facility Diet recommendation:  Renal diet DISCHARGE MEDICATION: Allergies as of 08/04/2024       Reactions   Shellfish Allergy Anaphylaxis, Swelling   Diazepam  Other (See Comments)   felt like out of body experience   Morphine  Itching, Other (See Comments)   Tolerated hydromorphone  on 07/2020 *will take along with Benadryl *        Medication List     STOP taking these medications    HYDROcodone -acetaminophen  5-325 MG tablet Commonly known as: NORCO/VICODIN   hydrOXYzine  25 MG tablet Commonly known as: ATARAX    midodrine  10 MG tablet Commonly known as: PROAMATINE    tiZANidine  4 MG tablet Commonly known as: ZANAFLEX    torsemide  100 MG tablet Commonly known as: DEMADEX        TAKE these medications    albuterol  108 (90 Base) MCG/ACT inhaler Commonly known as: VENTOLIN  HFA Inhale 2 puffs into the lungs every 4 (four) hours as needed for wheezing or shortness of breath.   aspirin  EC 81 MG tablet Take 1 tablet (81 mg total) by mouth daily. What changed: Another medication with the same name was removed. Continue taking this medication, and follow the directions you see here.   atorvastatin  80 MG tablet Commonly known as: LIPITOR  Take 1 tablet (80 mg total) by mouth every evening. What changed: when to take this   carvedilol  6.25 MG tablet Commonly known as: COREG  Take 1 tablet (6.25 mg total) by mouth 2 (two) times daily. What changed:  medication strength how much to take   diclofenac  Sodium 1 % Gel Commonly known as: VOLTAREN  Apply 2 g topically daily as needed (for pain).   DULoxetine  30 MG capsule Commonly known as:  CYMBALTA  Take 1 capsule (30 mg total) by mouth daily. Start taking on: August 05, 2024   fluticasone  50 MCG/ACT nasal spray Commonly known as: FLONASE  Place 2 sprays into both nostrils daily as needed for allergies or rhinitis.   fluticasone  furoate-vilanterol 200-25 MCG/INH Aepb Commonly known as: BREO ELLIPTA  Inhale 1 puff into the lungs daily. What changed:  when to take this reasons to take this   naloxone  4 MG/0.1ML Liqd nasal spray kit Commonly known as: NARCAN  Place 1 spray into the nose daily as needed (for overdose).   nitroGLYCERIN  0.4 MG SL tablet Commonly known as: NITROSTAT  Place 1 tablet (0.4 mg total) under the tongue every 5 (five) minutes x 3 doses as needed for chest pain.   oxyCODONE  5 MG immediate release tablet Commonly known as: Oxy IR/ROXICODONE  Take 1 tablet (5 mg total) by mouth every 6 (six) hours as needed for moderate pain (pain score 4-6) or severe pain (pain score 7-10). What changed:  medication strength how much to take when to take this reasons to take this   Ozempic (2 MG/DOSE) 8 MG/3ML Sopn Generic drug: Semaglutide (2 MG/DOSE) Inject 8 mg into the skin once a week.   pantoprazole  40 MG tablet Commonly known as: PROTONIX  Take 40 mg by mouth daily before breakfast.  polyethylene glycol powder 17 GM/SCOOP powder Commonly known as: GLYCOLAX /MIRALAX  Dissolve 1 capful (17g) in 4-8 ounces of liquid and take by mouth daily as needed for mild constipation   pregabalin  75 MG capsule Commonly known as: LYRICA  Take 1 capsule (75 mg total) by mouth 2 (two) times daily. What changed:  medication strength how much to take   valACYclovir  500 MG tablet Commonly known as: VALTREX  TAKE (1) TABLET BY MOUTH EVERY OTHER DAY. What changed: See the new instructions.   Velphoro  500 MG chewable tablet Generic drug: sucroferric oxyhydroxide Take 1 tablet with meals and snacks What changed:  how much to take how to take this when to take  this additional instructions               Discharge Care Instructions  (From admission, onward)           Start     Ordered   07/30/24 0000  If the dressing is still on your incision site when you go home, remove it on the third day after your surgery date. Remove dressing if it begins to fall off, or if it is dirty or damaged before the third day.        07/30/24 1517            Contact information for follow-up providers     Wonda Sharper, MD Follow up on 10/31/2024.   Specialty: Cardiology Why: Appt Time: 3:20 PM, Please arrive 15 min early. Bring all medications. Contact information: [address] [place] [address] [phone]         [facility], [address] Schedule an appointment as soon as possible for a visit in 2 week(s).   Why: for hospital follow-up Contact information: [address] [place] [address] [phone]              Contact information for after-discharge care     Destination     [facility] .   Service: Skilled Nursing Contact information: [name] [place]  [address] [phone]                    Discharge Exam: Filed Weights   08/01/24 1330 08/03/24 0847 08/03/24 1302  Weight: 107.1 kg 108.9 kg 107.6 kg   General: Alert and oriented x 3, NAD Cardiovascular: S1 S2 clear, RRR.  Respiratory: CTAB, no wheezing, rales or rhonchi Gastrointestinal: Soft, nontender, nondistended, NBS Ext: no pedal edema bilaterally Neuro: no new deficits Psych: Normal affect   Condition at discharge: good  The results of significant diagnostics from this hospitalization (including imaging, microbiology, ancillary and laboratory) are listed below for reference.   Imaging Studies: ECHOCARDIOGRAM COMPLETE Result Date: 07/30/2024    ECHOCARDIOGRAM REPORT   Patient Name:   KAYLAN YATES Date of Exam: 07/30/2024 Medical Rec #:  [id number]     Height:       65.0 in Accession #:    [id number]     Weight:       227.7 lb Date of Birth:  07-24-1955     BSA:          2.090 m Patient Age:    68 years      BP:           125/44 mmHg Patient Gender: F             HR:           86 bpm. Exam Location:  Inpatient Procedure: 2D Echo,  Cardiac Doppler, Color Doppler and PEDOF (Both Spectral and            Color Flow Doppler were utilized during procedure). Indications:    Aortic Stenosis I35.0  History:        Patient has prior history of Echocardiogram examinations, most                 recent 03/04/2024. CHF, CAD and Previous Myocardial Infarction,                 COPD, Aortic Valve Disease; Risk Factors:Dyslipidemia,                 Hypertension, Former Smoker, Diabetes, Sleep Apnea and ESRD,                 GERD.  Sonographer:    Koleen Popper RDCS Referring Phys: WADDELL DELENA DONATH  Sonographer Comments: Image acquisition challenging due to patient body habitus. IMPRESSIONS  1. Left ventricular ejection fraction, by estimation, is 65 to 70%. The left ventricle has normal function. The left ventricle has no regional wall motion abnormalities. Left ventricular diastolic parameters are consistent with Grade I diastolic dysfunction (impaired relaxation).  2. Right ventricular systolic function is normal. The right ventricular size is normal. Tricuspid regurgitation signal is inadequate for assessing PA pressure.  3. The mitral valve is degenerative. Trivial mitral valve regurgitation. Mild to moderate mitral stenosis. The mean mitral valve gradient is 6.5 mmHg with average heart rate of 86 bpm. Severe mitral annular calcification.  4. The aortic valve was not well visualized. Aortic valve regurgitation is mild. Moderate aortic valve stenosis. Aortic valve area, by VTI measures 1.16 cm. Aortic valve mean gradient measures 23.0 mmHg. Aortic valve Vmax measures 3.20 m/s.  5. The inferior vena cava is normal in size with greater than 50% respiratory variability, suggesting right atrial pressure of 3 mmHg. Comparison(s): No  significant change from prior study. FINDINGS  Left Ventricle: Left ventricular ejection fraction, by estimation, is 65 to 70%. The left ventricle has normal function. The left ventricle has no regional wall motion abnormalities. The left ventricular internal cavity size was normal in size. There is  no left ventricular hypertrophy. Left ventricular diastolic parameters are consistent with Grade I diastolic dysfunction (impaired relaxation). Right Ventricle: The right ventricular size is normal. No increase in right ventricular wall thickness. Right ventricular systolic function is normal. Tricuspid regurgitation signal is inadequate for assessing PA pressure. Left Atrium: Left atrial size was normal in size. Right Atrium: Right atrial size was normal in size. Pericardium: There is no evidence of pericardial effusion. Mitral Valve: The mitral valve is degenerative in appearance. Severe mitral annular calcification. Trivial mitral valve regurgitation. Mild to moderate mitral valve stenosis. MV peak gradient, 15.2 mmHg. The mean mitral valve gradient is 6.5 mmHg with average heart rate of 86 bpm. Tricuspid Valve: The tricuspid valve is grossly normal. Tricuspid valve regurgitation is trivial. No evidence of tricuspid stenosis. Aortic Valve: The aortic valve was not well visualized. Aortic valve regurgitation is mild. Aortic regurgitation PHT measures 104 msec. Moderate aortic stenosis is present. Aortic valve mean gradient measures 23.0 mmHg. Aortic valve peak gradient measures 41.0 mmHg. Aortic valve area, by VTI measures 1.16 cm. Pulmonic Valve: The pulmonic valve was grossly normal. Pulmonic valve regurgitation is not visualized. No evidence of pulmonic stenosis. Aorta: The aortic root and ascending aorta are structurally normal, with no evidence of dilitation. Venous: The inferior vena cava is normal in size with greater than  50% respiratory variability, suggesting right atrial pressure of 3 mmHg. IAS/Shunts: The  atrial septum is grossly normal.  LEFT VENTRICLE PLAX 2D LVIDd:         5.00 cm      Diastology LVIDs:         3.50 cm      LV e' medial:    6.64 cm/s LV PW:         1.00 cm      LV E/e' medial:  14.8 LV IVS:        0.90 cm      LV e' lateral:   6.20 cm/s LVOT diam:     1.70 cm      LV E/e' lateral: 15.9 LV SV:         76 LV SV Index:   36 LVOT Area:     2.27 cm  LV Volumes (MOD) LV vol d, MOD A4C: 120.0 ml LV vol s, MOD A4C: 56.9 ml LV SV MOD A4C:     120.0 ml RIGHT VENTRICLE             IVC RV S prime:     10.80 cm/s  IVC diam: 1.30 cm TAPSE (M-mode): 2.1 cm LEFT ATRIUM             Index LA diam:        4.20 cm 2.01 cm/m LA Vol (A2C):   51.8 ml 24.78 ml/m LA Vol (A4C):   54.9 ml 26.26 ml/m LA Biplane Vol: 53.9 ml 25.78 ml/m  AORTIC VALVE AV Area (Vmax):    1.18 cm AV Area (Vmean):   1.14 cm AV Area (VTI):     1.16 cm AV Vmax:           320.00 cm/s AV Vmean:          227.000 cm/s AV VTI:            0.651 m AV Peak Grad:      41.0 mmHg AV Mean Grad:      23.0 mmHg LVOT Vmax:         166.50 cm/s LVOT Vmean:        114.500 cm/s LVOT VTI:          0.334 m LVOT/AV VTI ratio: 0.51 AI PHT:            104 msec  AORTA Ao Root diam: 2.70 cm Ao Asc diam:  3.70 cm MITRAL VALVE MV Area (PHT): 3.91 cm     SHUNTS MV Area VTI:   1.69 cm     Systemic VTI:  0.33 m MV Peak grad:  15.2 mmHg    Systemic Diam: 1.70 cm MV Mean grad:  6.5 mmHg MV Vmax:       1.95 m/s MV Vmean:      122.0 cm/s MV Decel Time: 194 msec MV E velocity: 98.50 cm/s MV A velocity: 165.00 cm/s MV E/A ratio:  0.60 Darryle Decent MD Electronically signed by Darryle Decent MD Signature Date/Time: 07/30/2024/1:56:17 PM    Final    CT Head Wo Contrast Result Date: 07/29/2024 EXAM: CT HEAD WITHOUT CONTRAST 07/29/2024 02:23:19 PM TECHNIQUE: CT of the head was performed without the administration of intravenous contrast. Automated exposure control, iterative reconstruction, and/or weight based adjustment of the mA/kV was utilized to reduce the radiation dose to  as low as reasonably achievable. COMPARISON: 10/20/2022 CLINICAL HISTORY: Mental status change, unknown cause FINDINGS: BRAIN AND VENTRICLES: No acute hemorrhage. No evidence  of acute infarct. No hydrocephalus. No extra-axial collection. No mass effect or midline shift. Septum pellucidum is present. Atrophy and white matter changes are mildly advanced for age, stable. Atherosclerotic calcifications are present in the cavernous carotid arteries bilaterally. No hyperdense vessel is present. ORBITS: No acute abnormality. SINUSES: No acute abnormality. SOFT TISSUES AND SKULL: No acute soft tissue abnormality. No skull fracture. IMPRESSION: 1. No acute intracranial abnormality. 2. Mildly advanced atrophy and white matter changes for age, stable. Electronically signed by: Lonni Necessary MD 07/29/2024 02:37 PM EST RP Workstation: [id number]   DG Chest Port 1 View Result Date: 07/29/2024 EXAM: 1 VIEW(S) XRAY OF THE CHEST 07/29/2024 01:19:47 PM COMPARISON: 06/21/2024 CLINICAL HISTORY: Altered mental status. FINDINGS: LINES, TUBES AND DEVICES: Vascular stent in left arm. LUNGS AND PLEURA: Low lung volumes. Increased interstitial markings with differential diagnosis including pulmonary edema versus atypical infection. No pleural effusion. No pneumothorax. HEART AND MEDIASTINUM: Atherosclerotic plaque. No acute abnormality of the cardiac and mediastinal silhouettes. BONES AND SOFT TISSUES: No acute osseous abnormality. IMPRESSION: 1. Increased interstitial markings, differential diagnosis includes pulmonary edema versus atypical infection. 2. Low lung volumes. Electronically signed by: Waddell Calk MD 07/29/2024 02:04 PM EST RP Workstation: [id number]    Microbiology: Results for orders placed or performed during the hospital encounter of 07/29/24  Blood culture (routine x 2)     Status: None   Collection Time: 07/29/24 12:50 PM   Specimen: BLOOD RIGHT HAND  Result Value Ref Range Status   Specimen Description  BLOOD RIGHT HAND  Final   Special Requests AEROBIC BOTTLE ONLY Blood Culture adequate volume  Final   Culture   Final    NO GROWTH 5 DAYS Performed at [facility], [address], [place], [place] [address]    Report Status 08/03/2024 FINAL  Final  Blood culture (routine x 2)     Status: None   Collection Time: 07/29/24  2:00 PM   Specimen: BLOOD RIGHT ARM  Result Value Ref Range Status   Specimen Description BLOOD RIGHT ARM  Final   Special Requests   Final    BOTTLES DRAWN AEROBIC AND ANAEROBIC Blood Culture adequate volume   Culture   Final    NO GROWTH 5 DAYS Performed at [facility], [address] [place], [place] [address]    Report Status 08/03/2024 FINAL  Final  Resp panel by RT-PCR (RSV, Flu A&B, Covid) Anterior Nasal Swab     Status: None   Collection Time: 07/31/24 11:16 AM   Specimen: Anterior Nasal Swab  Result Value Ref Range Status   SARS Coronavirus 2 by RT PCR NEGATIVE NEGATIVE Final   Influenza A by PCR NEGATIVE NEGATIVE Final   Influenza B by PCR NEGATIVE NEGATIVE Final    Comment: (NOTE) The Xpert Xpress SARS-CoV-2/FLU/RSV plus assay is intended as an aid in the diagnosis of influenza from Nasopharyngeal swab specimens and should not be used as a sole basis for treatment. Nasal washings and aspirates are unacceptable for Xpert Xpress SARS-CoV-2/FLU/RSV testing.  Fact Sheet for Patients: bloggercourse.com  Fact Sheet for Healthcare Providers: seriousbroker.it  This test is not yet approved or cleared by the [place]  FDA and has been authorized for detection and/or diagnosis of SARS-CoV-2 by FDA under an Emergency Use Authorization (EUA). This EUA will remain in effect (meaning this test can be used) for the duration of the COVID-19 declaration under Section 564(b)(1) of the Act, 21 U.S.C. section 360bbb-3(b)(1), unless the authorization is terminated or revoked.  Resp Syncytial  Virus by PCR NEGATIVE NEGATIVE Final    Comment: (NOTE) Fact Sheet for Patients: bloggercourse.com  Fact Sheet for Healthcare Providers: seriousbroker.it  This test is not yet approved or cleared by the [place]  FDA and has been authorized for detection and/or diagnosis of SARS-CoV-2 by FDA under an Emergency Use Authorization (EUA). This EUA will remain in effect (meaning this test can be used) for the duration of the COVID-19 declaration under Section 564(b)(1) of the Act, 21 U.S.C. section 360bbb-3(b)(1), unless the authorization is terminated or revoked.  Performed at [facility], [address], [place], [place] [address]    *Note: Due to a large number of results and/or encounters for the requested time period, some results have not been displayed. A complete set of results can be found in Results Review.    Labs: CBC: Recent Labs  Lab 07/29/24 1400 07/30/24 0406 08/02/24 0342 08/03/24 0343 08/04/24 0601  WBC 6.9 6.5 8.5 8.2 6.6  NEUTROABS 4.6  --   --   --   --   HGB 11.1* 12.1 12.8 11.9* 11.8*  HCT 37.7 38.9 40.5 39.1 37.7  MCV 91.7 89.4 88.0 89.3 88.1  PLT 160 142* 152 179 144*   Basic Metabolic Panel: Recent Labs  Lab 07/29/24 1250 07/29/24 1320 07/30/24 0406 08/02/24 0342 08/03/24 0343 08/04/24 0601  NA 136 135 135 132* 134* 129*  K 5.5* 5.1 4.2 5.1 5.3* 5.2*  CL 93*  --  93* 89* 90* 90*  CO2 25  --  26 27 25 22   GLUCOSE 236*  --  133* 241* 219* 237*  BUN 40*  --  22 44* 69* 53*  CREATININE 6.98*  --  4.38* 5.87* 7.59* 5.88*  CALCIUM  10.1  --  10.0 9.8 9.4 9.0  PHOS  --   --   --   --  6.0*  --    Liver Function Tests: Recent Labs  Lab 07/29/24 1250 07/30/24 0406  AST 26 23  ALT 27 22  ALKPHOS 270* 273*  BILITOT 0.3 0.3  PROT 6.9 7.0  ALBUMIN  4.1 4.1   CBG: Recent Labs  Lab 08/03/24 0809 08/03/24 1421 08/03/24 1712 08/03/24 1954 08/04/24 0756  GLUCAP 252* 184* 319*  507* 218*    Discharge time spent:  38 minutes.  Signed: Drue ONEIDA Potter, MD [facility] 08/04/2024 "

## 2024-08-04 NOTE — Progress Notes (Signed)
 Gave report to Tiffany at [facility].

## 2024-08-16 ENCOUNTER — Encounter (HOSPITAL_BASED_OUTPATIENT_CLINIC_OR_DEPARTMENT_OTHER): Payer: Self-pay | Admitting: Adult Health Nurse Practitioner

## 2024-08-16 ENCOUNTER — Other Ambulatory Visit (HOSPITAL_BASED_OUTPATIENT_CLINIC_OR_DEPARTMENT_OTHER): Payer: Self-pay | Admitting: Adult Health Nurse Practitioner

## 2024-08-16 DIAGNOSIS — R0989 Other specified symptoms and signs involving the circulatory and respiratory systems: Secondary | ICD-10-CM

## 2024-10-31 ENCOUNTER — Ambulatory Visit: Admitting: Cardiovascular Disease
